# Patient Record
Sex: Female | Born: 1950
Health system: Southern US, Community
[De-identification: ages and names within clinical notes are randomized; demographics above are authoritative.]

## PROBLEM LIST (undated history)

## (undated) DIAGNOSIS — E119 Type 2 diabetes mellitus without complications: Secondary | ICD-10-CM

## (undated) DIAGNOSIS — E669 Obesity, unspecified: Secondary | ICD-10-CM

## (undated) DIAGNOSIS — K2270 Barrett's esophagus without dysplasia: Secondary | ICD-10-CM

## (undated) DIAGNOSIS — G4733 Obstructive sleep apnea (adult) (pediatric): Secondary | ICD-10-CM

## (undated) DIAGNOSIS — K219 Gastro-esophageal reflux disease without esophagitis: Secondary | ICD-10-CM

## (undated) DIAGNOSIS — R569 Unspecified convulsions: Secondary | ICD-10-CM

## (undated) DIAGNOSIS — F419 Anxiety disorder, unspecified: Secondary | ICD-10-CM

## (undated) DIAGNOSIS — I272 Pulmonary hypertension, unspecified: Secondary | ICD-10-CM

## (undated) DIAGNOSIS — R55 Syncope and collapse: Secondary | ICD-10-CM

## (undated) DIAGNOSIS — K449 Diaphragmatic hernia without obstruction or gangrene: Secondary | ICD-10-CM

## (undated) DIAGNOSIS — D649 Anemia, unspecified: Secondary | ICD-10-CM

## (undated) DIAGNOSIS — M545 Low back pain, unspecified: Secondary | ICD-10-CM

## (undated) DIAGNOSIS — R942 Abnormal results of pulmonary function studies: Secondary | ICD-10-CM

## (undated) DIAGNOSIS — M1990 Unspecified osteoarthritis, unspecified site: Secondary | ICD-10-CM

## (undated) DIAGNOSIS — Z7901 Long term (current) use of anticoagulants: Secondary | ICD-10-CM

## (undated) DIAGNOSIS — K3184 Gastroparesis: Secondary | ICD-10-CM

## (undated) DIAGNOSIS — K589 Irritable bowel syndrome without diarrhea: Secondary | ICD-10-CM

## (undated) DIAGNOSIS — I4891 Unspecified atrial fibrillation: Secondary | ICD-10-CM

## (undated) DIAGNOSIS — I480 Paroxysmal atrial fibrillation: Secondary | ICD-10-CM

## (undated) DIAGNOSIS — Z9989 Dependence on other enabling machines and devices: Secondary | ICD-10-CM

## (undated) DIAGNOSIS — G8929 Other chronic pain: Secondary | ICD-10-CM

## (undated) DIAGNOSIS — E785 Hyperlipidemia, unspecified: Secondary | ICD-10-CM

## (undated) DIAGNOSIS — E039 Hypothyroidism, unspecified: Secondary | ICD-10-CM

## (undated) DIAGNOSIS — I499 Cardiac arrhythmia, unspecified: Secondary | ICD-10-CM

## (undated) DIAGNOSIS — I5042 Chronic combined systolic (congestive) and diastolic (congestive) heart failure: Secondary | ICD-10-CM

## (undated) DIAGNOSIS — I10 Essential (primary) hypertension: Secondary | ICD-10-CM

## (undated) HISTORY — DX: Essential (primary) hypertension: I10

## (undated) HISTORY — DX: Abnormal results of pulmonary function studies: R94.2

## (undated) HISTORY — DX: Gastro-esophageal reflux disease without esophagitis: K21.9

## (undated) HISTORY — DX: Anemia, unspecified: D64.9

## (undated) HISTORY — DX: Syncope and collapse: R55

## (undated) HISTORY — DX: Obesity, unspecified: E66.9

## (undated) HISTORY — DX: Hypothyroidism, unspecified: E03.9

## (undated) HISTORY — DX: Chronic combined systolic (congestive) and diastolic (congestive) heart failure: I50.42

## (undated) HISTORY — DX: Hyperlipidemia, unspecified: E78.5

## (undated) HISTORY — DX: Diaphragmatic hernia without obstruction or gangrene: K44.9

## (undated) HISTORY — PX: LAPAROSCOPIC CHOLECYSTECTOMY: SUR755

## (undated) HISTORY — DX: Low back pain, unspecified: M54.50

## (undated) HISTORY — DX: Obstructive sleep apnea (adult) (pediatric): G47.33

## (undated) HISTORY — DX: Unspecified atrial fibrillation: I48.91

## (undated) HISTORY — DX: Low back pain: M54.5

## (undated) HISTORY — DX: Long term (current) use of anticoagulants: Z79.01

## (undated) HISTORY — DX: Other chronic pain: G89.29

## (undated) HISTORY — DX: Dependence on other enabling machines and devices: Z99.89

## (undated) HISTORY — DX: Type 2 diabetes mellitus without complications: E11.9

## (undated) HISTORY — DX: Irritable bowel syndrome, unspecified: K58.9

## (undated) HISTORY — DX: Paroxysmal atrial fibrillation: I48.0

## (undated) HISTORY — DX: Gastroparesis: K31.84

---

## 1992-07-04 HISTORY — PX: CARPAL TUNNEL RELEASE: SHX101

## 1993-07-04 HISTORY — PX: LAMINECTOMY: SHX219

## 1994-07-04 HISTORY — PX: BACK SURGERY: SHX140

## 1997-07-04 HISTORY — PX: TOTAL ABDOMINAL HYSTERECTOMY: SHX209

## 2000-05-12 ENCOUNTER — Encounter: Payer: Self-pay | Admitting: Infectious Diseases

## 2000-05-12 ENCOUNTER — Ambulatory Visit (HOSPITAL_COMMUNITY): Admission: RE | Admit: 2000-05-12 | Discharge: 2000-05-12 | Payer: Self-pay | Admitting: Infectious Diseases

## 2000-07-04 HISTORY — PX: CARDIAC CATHETERIZATION: SHX172

## 2000-11-03 ENCOUNTER — Encounter: Payer: Self-pay | Admitting: *Deleted

## 2000-11-04 ENCOUNTER — Encounter: Payer: Self-pay | Admitting: *Deleted

## 2000-11-04 ENCOUNTER — Inpatient Hospital Stay (HOSPITAL_COMMUNITY): Admission: EM | Admit: 2000-11-04 | Discharge: 2000-11-07 | Payer: Self-pay | Admitting: *Deleted

## 2000-12-18 ENCOUNTER — Ambulatory Visit (HOSPITAL_COMMUNITY): Admission: RE | Admit: 2000-12-18 | Discharge: 2000-12-18 | Payer: Self-pay | Admitting: Internal Medicine

## 2001-06-15 ENCOUNTER — Encounter: Payer: Self-pay | Admitting: Family Medicine

## 2001-06-15 ENCOUNTER — Ambulatory Visit (HOSPITAL_COMMUNITY): Admission: RE | Admit: 2001-06-15 | Discharge: 2001-06-15 | Payer: Self-pay | Admitting: Family Medicine

## 2001-07-20 ENCOUNTER — Emergency Department (HOSPITAL_COMMUNITY): Admission: AC | Admit: 2001-07-20 | Discharge: 2001-07-20 | Payer: Self-pay

## 2001-07-20 ENCOUNTER — Encounter: Payer: Self-pay | Admitting: *Deleted

## 2001-09-18 ENCOUNTER — Ambulatory Visit: Admission: RE | Admit: 2001-09-18 | Discharge: 2001-09-18 | Payer: Self-pay | Admitting: Family Medicine

## 2001-10-27 ENCOUNTER — Inpatient Hospital Stay (HOSPITAL_COMMUNITY): Admission: EM | Admit: 2001-10-27 | Discharge: 2001-10-31 | Payer: Self-pay | Admitting: *Deleted

## 2002-01-13 ENCOUNTER — Emergency Department (HOSPITAL_COMMUNITY): Admission: EM | Admit: 2002-01-13 | Discharge: 2002-01-13 | Payer: Self-pay | Admitting: *Deleted

## 2002-01-13 ENCOUNTER — Encounter: Payer: Self-pay | Admitting: *Deleted

## 2002-03-01 ENCOUNTER — Emergency Department (HOSPITAL_COMMUNITY): Admission: EM | Admit: 2002-03-01 | Discharge: 2002-03-01 | Payer: Self-pay | Admitting: Emergency Medicine

## 2002-03-01 ENCOUNTER — Encounter: Payer: Self-pay | Admitting: Emergency Medicine

## 2002-05-03 ENCOUNTER — Emergency Department (HOSPITAL_COMMUNITY): Admission: EM | Admit: 2002-05-03 | Discharge: 2002-05-03 | Payer: Self-pay | Admitting: *Deleted

## 2002-05-03 ENCOUNTER — Encounter: Payer: Self-pay | Admitting: Emergency Medicine

## 2002-05-09 ENCOUNTER — Encounter: Admission: RE | Admit: 2002-05-09 | Discharge: 2002-08-07 | Payer: Self-pay | Admitting: Family Medicine

## 2002-05-25 ENCOUNTER — Emergency Department (HOSPITAL_COMMUNITY): Admission: EM | Admit: 2002-05-25 | Discharge: 2002-05-25 | Payer: Self-pay

## 2002-11-13 ENCOUNTER — Encounter: Payer: Self-pay | Admitting: Family Medicine

## 2002-11-13 ENCOUNTER — Ambulatory Visit (HOSPITAL_COMMUNITY): Admission: RE | Admit: 2002-11-13 | Discharge: 2002-11-13 | Payer: Self-pay | Admitting: Family Medicine

## 2002-11-15 ENCOUNTER — Ambulatory Visit (HOSPITAL_COMMUNITY): Admission: RE | Admit: 2002-11-15 | Discharge: 2002-11-15 | Payer: Self-pay | Admitting: Family Medicine

## 2003-01-09 ENCOUNTER — Emergency Department (HOSPITAL_COMMUNITY): Admission: EM | Admit: 2003-01-09 | Discharge: 2003-01-09 | Payer: Self-pay | Admitting: Emergency Medicine

## 2003-01-09 ENCOUNTER — Encounter: Payer: Self-pay | Admitting: Emergency Medicine

## 2003-04-04 ENCOUNTER — Ambulatory Visit (HOSPITAL_COMMUNITY): Admission: RE | Admit: 2003-04-04 | Discharge: 2003-04-04 | Payer: Self-pay | Admitting: Family Medicine

## 2003-04-04 ENCOUNTER — Encounter: Payer: Self-pay | Admitting: Family Medicine

## 2003-10-11 ENCOUNTER — Emergency Department (HOSPITAL_COMMUNITY): Admission: AD | Admit: 2003-10-11 | Discharge: 2003-10-11 | Payer: Self-pay | Admitting: Family Medicine

## 2003-11-20 ENCOUNTER — Emergency Department (HOSPITAL_COMMUNITY): Admission: EM | Admit: 2003-11-20 | Discharge: 2003-11-20 | Payer: Self-pay | Admitting: Family Medicine

## 2004-05-21 ENCOUNTER — Emergency Department (HOSPITAL_COMMUNITY): Admission: EM | Admit: 2004-05-21 | Discharge: 2004-05-22 | Payer: Self-pay

## 2004-09-20 ENCOUNTER — Emergency Department (HOSPITAL_COMMUNITY): Admission: EM | Admit: 2004-09-20 | Discharge: 2004-09-20 | Payer: Self-pay | Admitting: Emergency Medicine

## 2005-01-20 ENCOUNTER — Ambulatory Visit (HOSPITAL_COMMUNITY): Admission: RE | Admit: 2005-01-20 | Discharge: 2005-01-20 | Payer: Self-pay | Admitting: Family Medicine

## 2006-01-12 ENCOUNTER — Emergency Department (HOSPITAL_COMMUNITY): Admission: EM | Admit: 2006-01-12 | Discharge: 2006-01-12 | Payer: Self-pay | Admitting: Family Medicine

## 2006-02-08 ENCOUNTER — Emergency Department (HOSPITAL_COMMUNITY): Admission: EM | Admit: 2006-02-08 | Discharge: 2006-02-08 | Payer: Self-pay | Admitting: Family Medicine

## 2006-02-25 IMAGING — CR DG CHEST 2V
2 series · 2 of 2 positions shown · non-contrast
Comparison: none

CLINICAL DATA: Cough.  Sore throat.  
 TWO VIEW CHEST ? 10/11/03 
 No prior studies.

[view not recorded (1 of 2)]
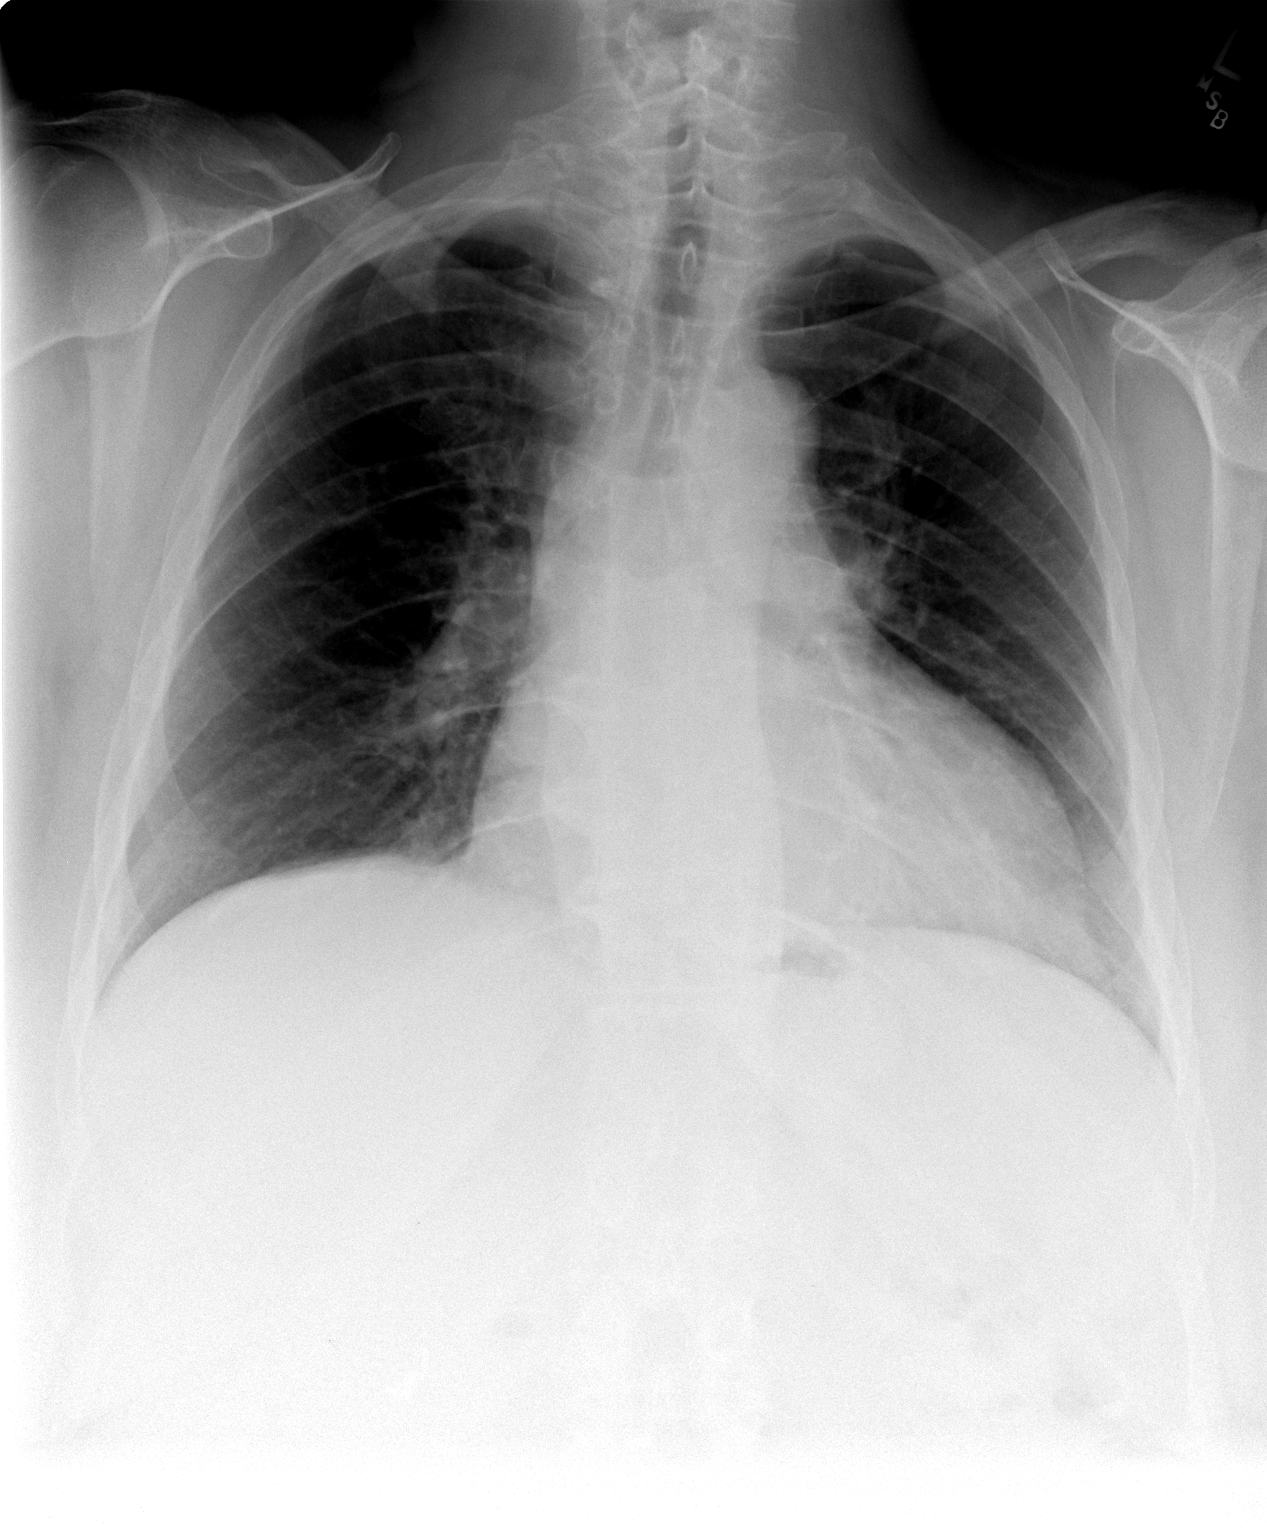

[view not recorded (2 of 2)]
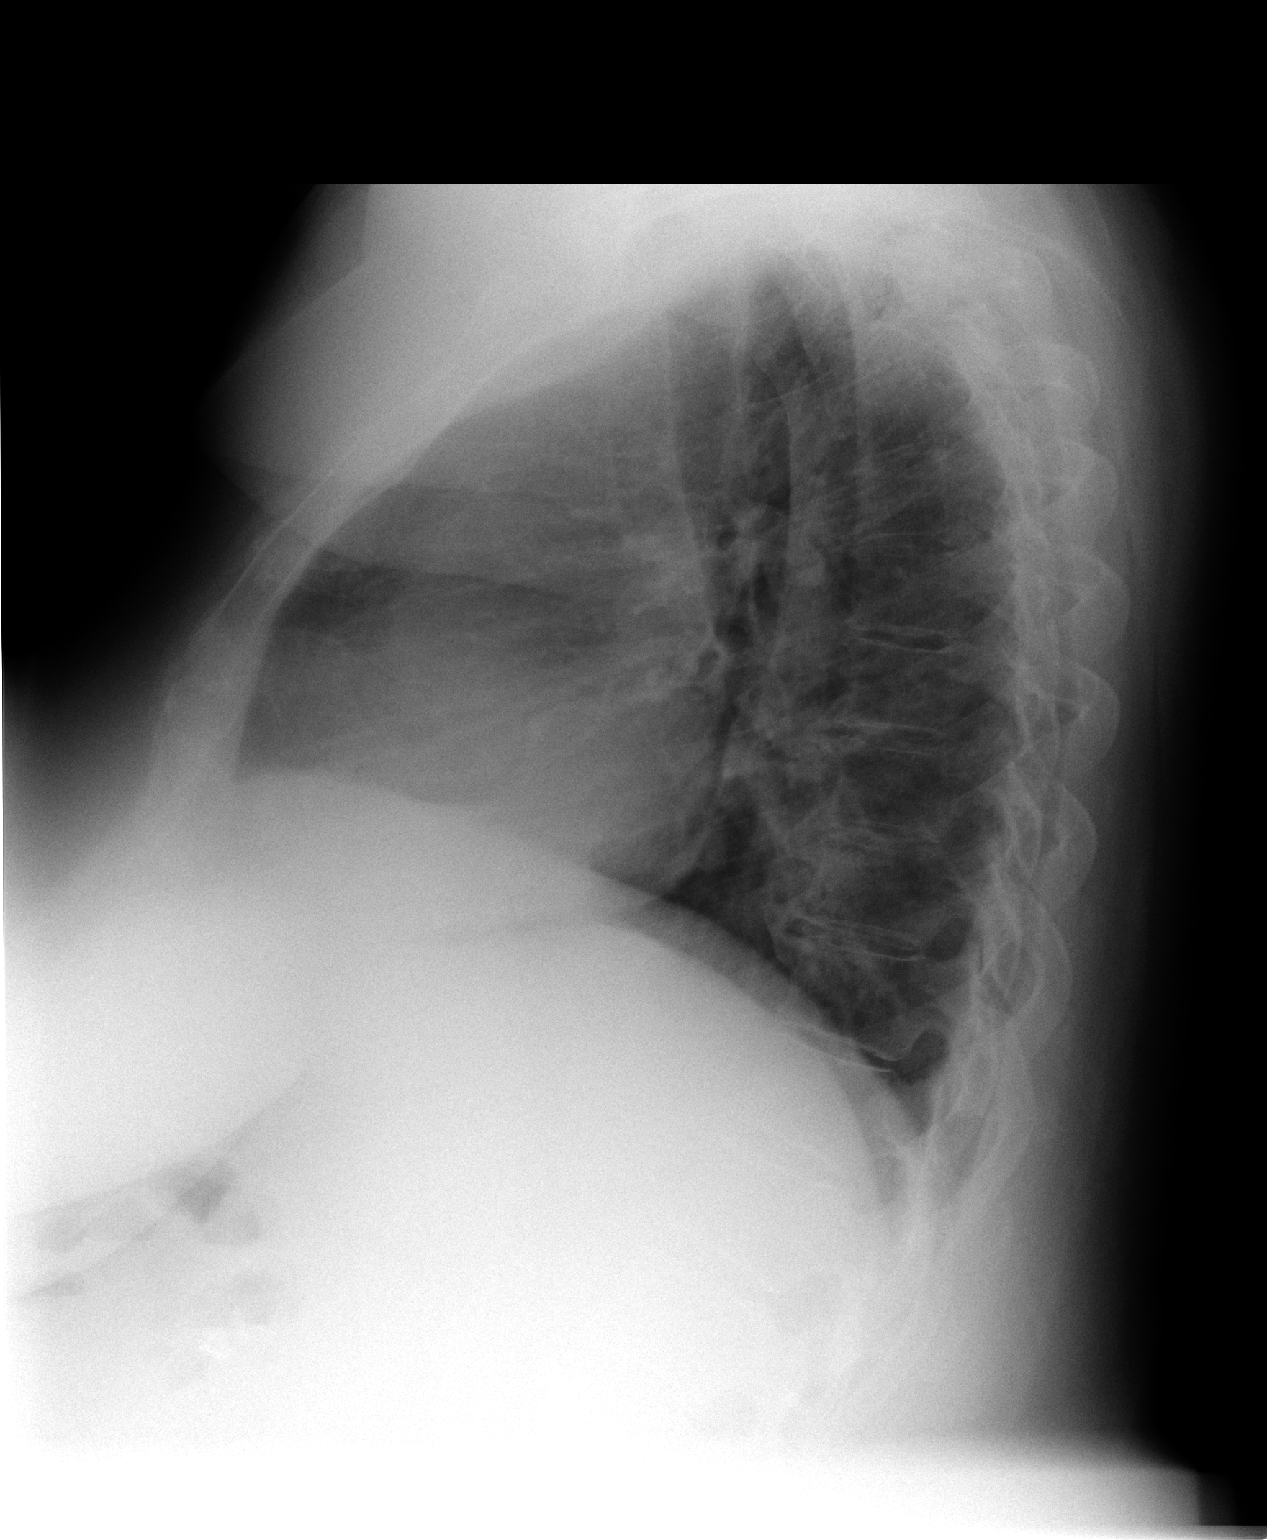

[2 of 2 positions shown; findings below may reference images not displayed]

FINDINGS: There is mild cardiomegaly.  Low-lung volumes are present.  The lungs appear clear.
IMPRESSION: Mild cardiomegaly.

## 2006-04-04 ENCOUNTER — Emergency Department (HOSPITAL_COMMUNITY): Admission: EM | Admit: 2006-04-04 | Discharge: 2006-04-04 | Payer: Self-pay | Admitting: Emergency Medicine

## 2006-07-04 HISTORY — PX: ESOPHAGOGASTRODUODENOSCOPY: SHX1529

## 2006-07-04 HISTORY — PX: CARDIOVASCULAR STRESS TEST: SHX262

## 2006-10-16 ENCOUNTER — Ambulatory Visit: Payer: Self-pay | Admitting: Internal Medicine

## 2006-10-16 ENCOUNTER — Observation Stay (HOSPITAL_COMMUNITY): Admission: EM | Admit: 2006-10-16 | Discharge: 2006-10-17 | Payer: Self-pay | Admitting: Emergency Medicine

## 2006-10-16 ENCOUNTER — Ambulatory Visit: Payer: Self-pay | Admitting: Cardiology

## 2006-10-24 ENCOUNTER — Ambulatory Visit: Payer: Self-pay

## 2006-10-25 ENCOUNTER — Ambulatory Visit: Payer: Self-pay

## 2006-11-03 ENCOUNTER — Ambulatory Visit: Payer: Self-pay | Admitting: Internal Medicine

## 2006-11-06 ENCOUNTER — Ambulatory Visit: Payer: Self-pay | Admitting: Cardiology

## 2006-11-24 ENCOUNTER — Encounter (INDEPENDENT_AMBULATORY_CARE_PROVIDER_SITE_OTHER): Payer: Self-pay | Admitting: Internal Medicine

## 2006-11-24 ENCOUNTER — Ambulatory Visit: Payer: Self-pay | Admitting: Internal Medicine

## 2006-11-24 ENCOUNTER — Ambulatory Visit (HOSPITAL_COMMUNITY): Admission: RE | Admit: 2006-11-24 | Discharge: 2006-11-24 | Payer: Self-pay | Admitting: Internal Medicine

## 2007-01-12 ENCOUNTER — Ambulatory Visit (HOSPITAL_COMMUNITY): Admission: RE | Admit: 2007-01-12 | Discharge: 2007-01-12 | Payer: Self-pay | Admitting: Internal Medicine

## 2007-01-24 ENCOUNTER — Ambulatory Visit (HOSPITAL_COMMUNITY): Admission: RE | Admit: 2007-01-24 | Discharge: 2007-01-24 | Payer: Self-pay | Admitting: Internal Medicine

## 2007-02-05 IMAGING — CT CT PELVIS W/ CM
1 of 3 series · 13 of 32 positions shown, 18 images · IV contrast (CONTRAST)
Comparison: Previous study made 07/20/01.

CLINICAL DATA: Left lower abdominal pain with nausea, vomiting, and diarrhea which began today.  The patient has a history of complications from a hysterectomy in 1444.

[Series 3591: — · axial · 0.88mm/px · z∈[+1350,+1804]mm · 13 of 105 slices shown, 18 images]
[im 7/105  soft-tissue]
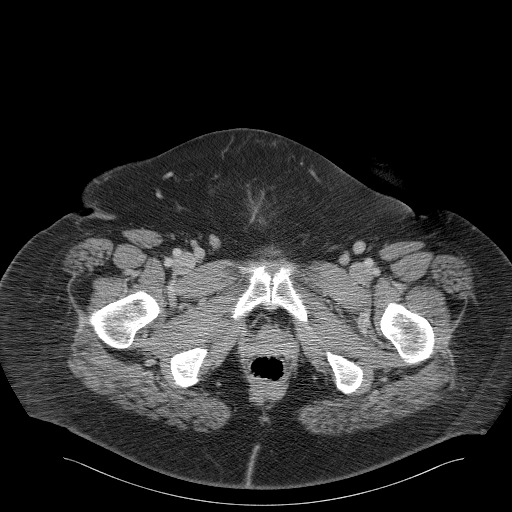
[im 7/105  bone]
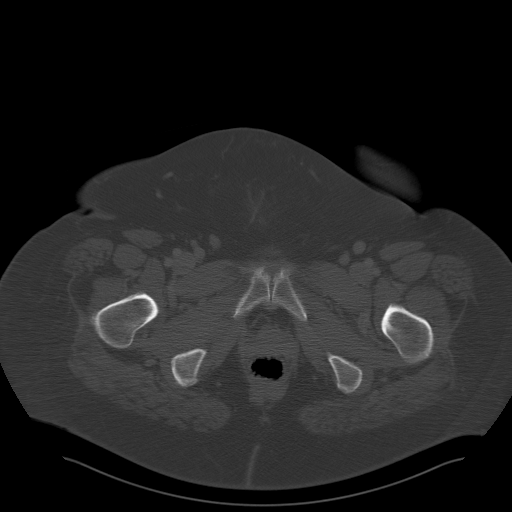
[im 19/105  soft-tissue]
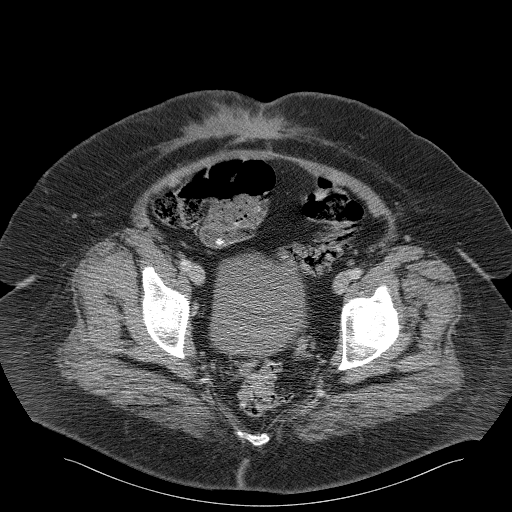
[im 25/105  soft-tissue]
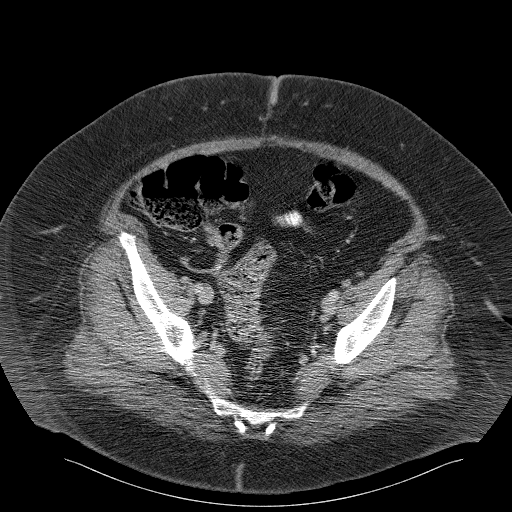
[im 31/105  soft-tissue]
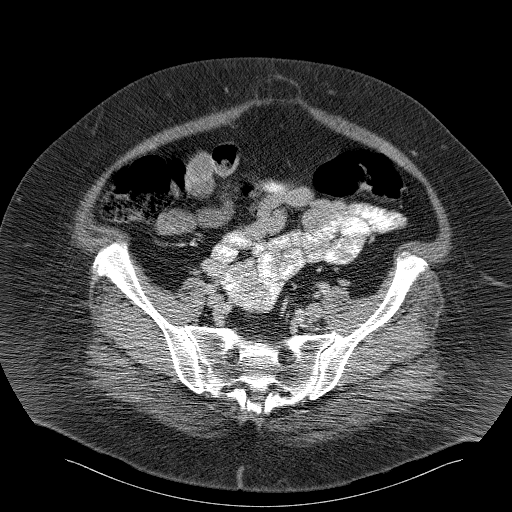
[im 43/105  soft-tissue]
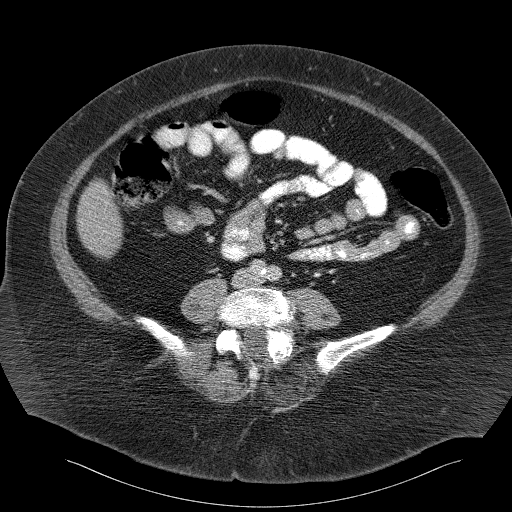
[im 49/105  soft-tissue]
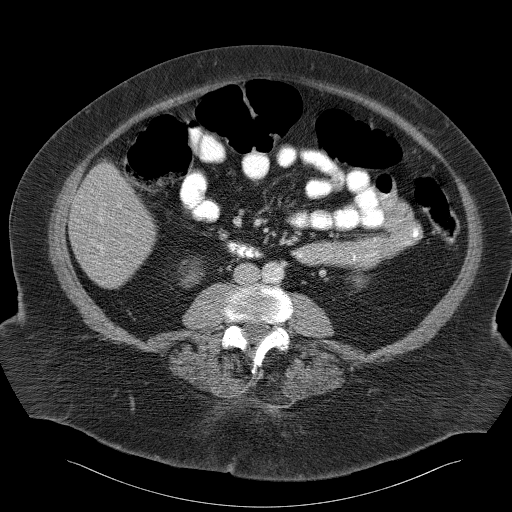
[im 56/105  soft-tissue]
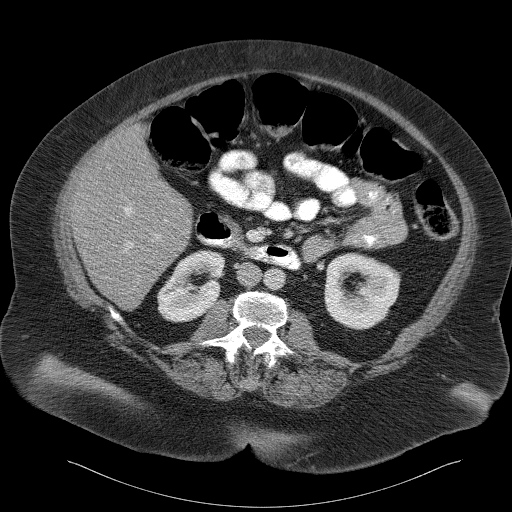
[im 68/105  soft-tissue]
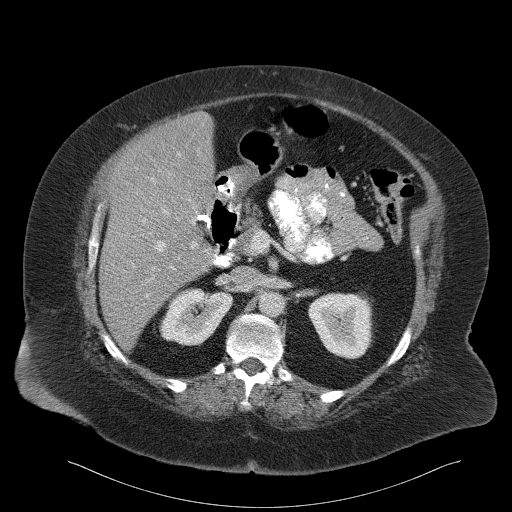
[im 74/105  soft-tissue]
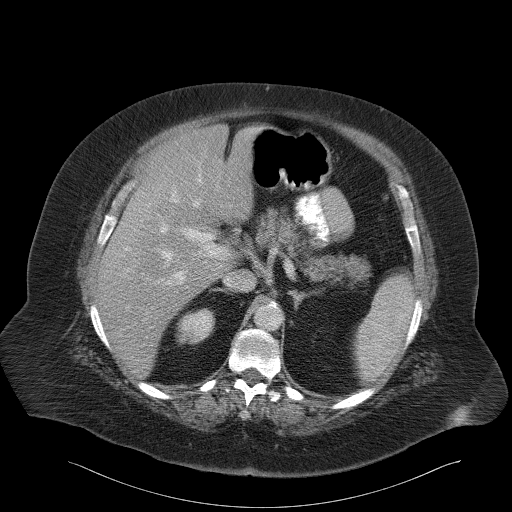
[im 74/105  bone]
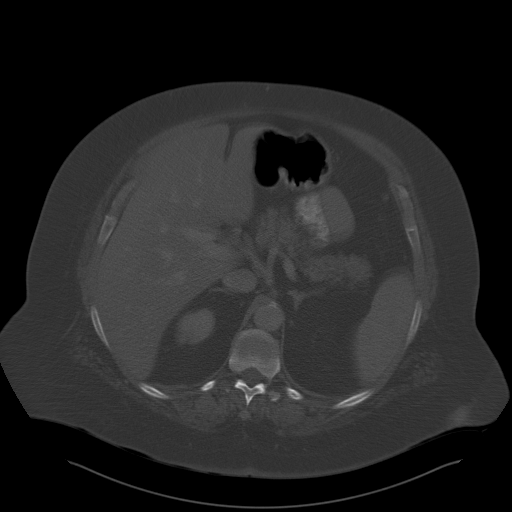
[im 80/105  soft-tissue]
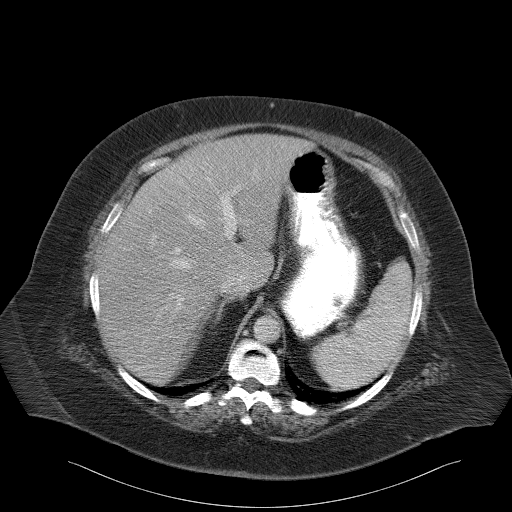
[im 80/105  lung]
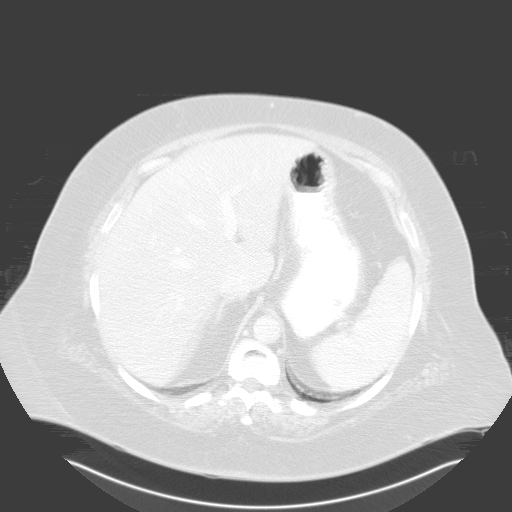
[im 86/105  lung]
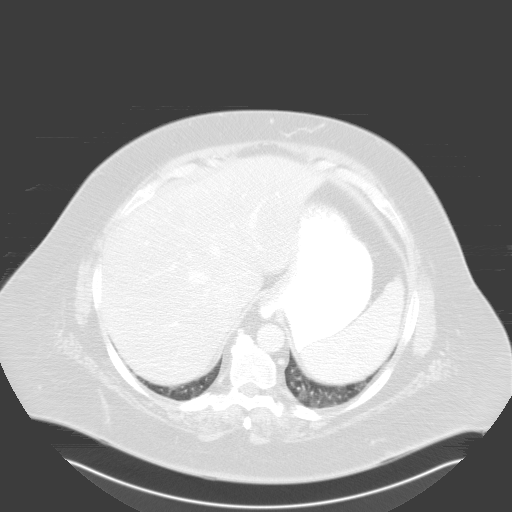
[im 92/105  soft-tissue]
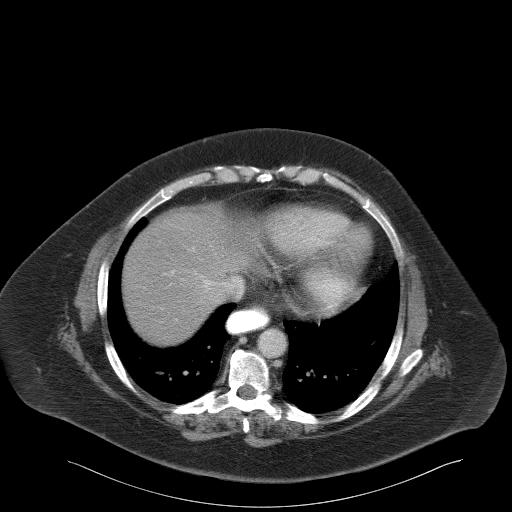
[im 92/105  lung]
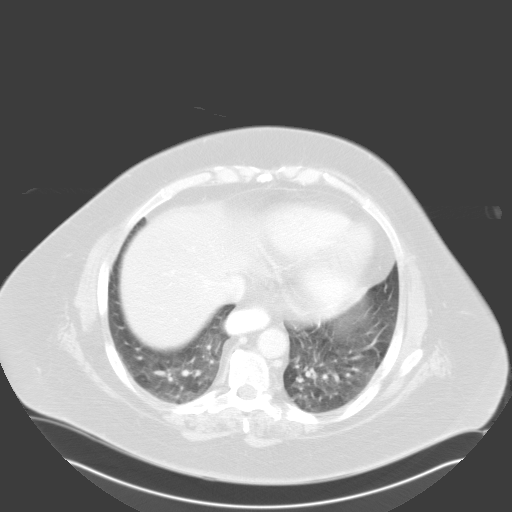
[im 98/105  soft-tissue]
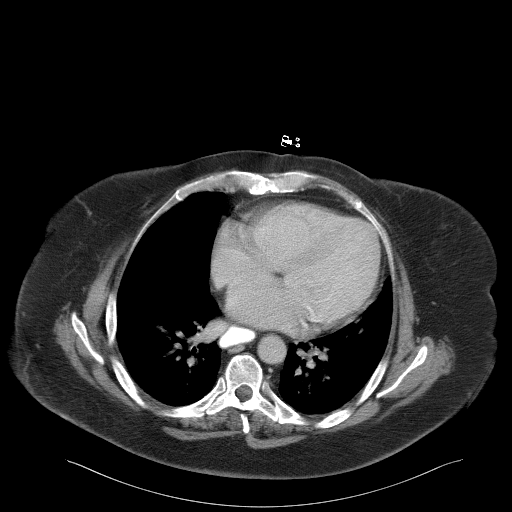
[im 98/105  lung]
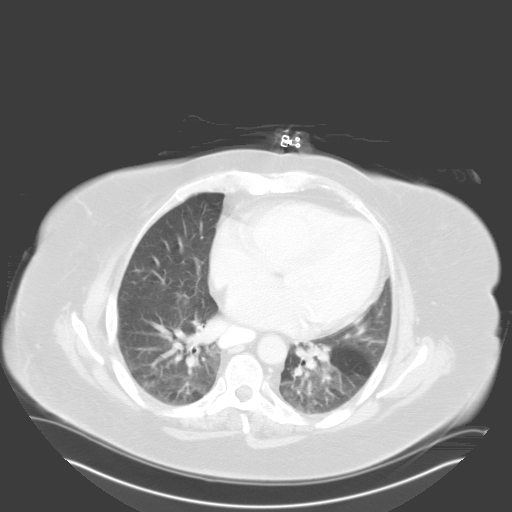

[13 of 32 positions shown; findings below may reference images not displayed]

CT SCAN OF THE ABDOMEN WITH CONTRAST:
 After the intravenous injection of 150 mL of Omnipaque 300, a series of scans through the abdomen were made and are compared to the previous study of 07/20/01 which was made without contrast and shows a hiatal hernia.  The lower lung fields appear normal.  The gallbladder is seen to have been removed.  The common bile duct, pancreas, and spleen appear normal.  The adrenal glands and the kidneys are normal.  There is no free fluid or air within the abdomen.  The small bowel appears normal.  There has been near complete transit of the small bowel by the contrast.  The colon shows a moderate amount of generalized gas, but no definite obstruction or mass is seen.  
 There is a small umbilical hernia that contains only fat.  The bones of the lower thoracic and the upper lumbar spine show some hypertrophic spurring, but are otherwise normal.  No abnormal lymph nodes are seen.
IMPRESSION: 1.  Hiatal hernia with reflux of the gastric contrast into the distal esophagus.  
 2.  Post cholecystectomy.  
 3.  Small umbilical hernia contains only fat.
 CT SCAN OF THE PELVIS WITH CONTRAST:
 CT scan of the pelvis, utilizing the same contrast as given for the abdomen, shows the uterus to have been removed.  The ovaries are not definitely identified.  There is a large anterior abdominal wall scar in the lower mid pelvis.  There is a moderate amount of gas and fecal material throughout the colon, but no definite obstruction, mass, or diverticulitis is identified.  No free fluid or air is seen within the pelvis.  No abnormal lymph nodes are seen.  The bones of the pelvis show some degenerative hypertrophic spurring as does the lower lumbar spine, where there are also noted facet arthritis changes.
IMPRESSION: 1.  Status post hysterectomy with large lower abdominal wall midline scar.  
 2.  No evidence of obstruction or perforation of bowel.  
 3.  The bladder is partially filled and appears normal, as are the distal ureters.  
 4.  Some degenerative hypertrophic facet arthritis in the lower lumbar spine.

## 2007-04-04 ENCOUNTER — Ambulatory Visit (HOSPITAL_COMMUNITY): Admission: RE | Admit: 2007-04-04 | Discharge: 2007-04-04 | Payer: Self-pay | Admitting: Family Medicine

## 2007-07-23 ENCOUNTER — Emergency Department (HOSPITAL_COMMUNITY): Admission: EM | Admit: 2007-07-23 | Discharge: 2007-07-24 | Payer: Self-pay | Admitting: Emergency Medicine

## 2007-12-05 ENCOUNTER — Encounter: Admission: RE | Admit: 2007-12-05 | Discharge: 2007-12-05 | Payer: Self-pay | Admitting: Surgery

## 2007-12-05 ENCOUNTER — Ambulatory Visit (HOSPITAL_COMMUNITY): Admission: RE | Admit: 2007-12-05 | Discharge: 2007-12-05 | Payer: Self-pay | Admitting: Surgery

## 2007-12-07 ENCOUNTER — Ambulatory Visit: Admission: RE | Admit: 2007-12-07 | Discharge: 2007-12-07 | Payer: Self-pay | Admitting: Surgery

## 2007-12-12 ENCOUNTER — Ambulatory Visit (HOSPITAL_COMMUNITY): Admission: RE | Admit: 2007-12-12 | Discharge: 2007-12-12 | Payer: Self-pay | Admitting: Surgery

## 2008-02-04 ENCOUNTER — Emergency Department (HOSPITAL_COMMUNITY): Admission: EM | Admit: 2008-02-04 | Discharge: 2008-02-04 | Payer: Self-pay | Admitting: Emergency Medicine

## 2008-02-09 ENCOUNTER — Emergency Department (HOSPITAL_COMMUNITY): Admission: EM | Admit: 2008-02-09 | Discharge: 2008-02-09 | Payer: Self-pay | Admitting: Emergency Medicine

## 2008-03-23 ENCOUNTER — Inpatient Hospital Stay (HOSPITAL_COMMUNITY): Admission: EM | Admit: 2008-03-23 | Discharge: 2008-03-30 | Payer: Self-pay | Admitting: Emergency Medicine

## 2008-03-23 ENCOUNTER — Ambulatory Visit: Payer: Self-pay | Admitting: Cardiology

## 2008-03-24 ENCOUNTER — Encounter: Payer: Self-pay | Admitting: Cardiology

## 2008-03-31 ENCOUNTER — Emergency Department (HOSPITAL_COMMUNITY): Admission: EM | Admit: 2008-03-31 | Discharge: 2008-03-31 | Payer: Self-pay | Admitting: Emergency Medicine

## 2008-04-02 ENCOUNTER — Ambulatory Visit: Payer: Self-pay | Admitting: Cardiology

## 2008-04-07 ENCOUNTER — Ambulatory Visit: Payer: Self-pay | Admitting: Cardiology

## 2008-04-10 DIAGNOSIS — I4891 Unspecified atrial fibrillation: Secondary | ICD-10-CM

## 2008-04-10 DIAGNOSIS — E669 Obesity, unspecified: Secondary | ICD-10-CM | POA: Diagnosis present

## 2008-04-10 DIAGNOSIS — M545 Low back pain, unspecified: Secondary | ICD-10-CM | POA: Insufficient documentation

## 2008-04-10 DIAGNOSIS — Z6841 Body Mass Index (BMI) 40.0 and over, adult: Secondary | ICD-10-CM | POA: Insufficient documentation

## 2008-04-10 DIAGNOSIS — R072 Precordial pain: Secondary | ICD-10-CM | POA: Insufficient documentation

## 2008-04-10 DIAGNOSIS — I509 Heart failure, unspecified: Secondary | ICD-10-CM | POA: Insufficient documentation

## 2008-04-10 DIAGNOSIS — K219 Gastro-esophageal reflux disease without esophagitis: Secondary | ICD-10-CM | POA: Insufficient documentation

## 2008-04-10 DIAGNOSIS — E039 Hypothyroidism, unspecified: Secondary | ICD-10-CM | POA: Insufficient documentation

## 2008-04-14 ENCOUNTER — Ambulatory Visit: Payer: Self-pay | Admitting: Cardiology

## 2008-04-21 ENCOUNTER — Ambulatory Visit: Payer: Self-pay | Admitting: Cardiology

## 2008-04-24 ENCOUNTER — Ambulatory Visit: Payer: Self-pay | Admitting: Cardiology

## 2008-04-29 ENCOUNTER — Emergency Department (HOSPITAL_COMMUNITY): Admission: EM | Admit: 2008-04-29 | Discharge: 2008-04-30 | Payer: Self-pay | Admitting: Emergency Medicine

## 2008-04-29 ENCOUNTER — Ambulatory Visit: Payer: Self-pay | Admitting: Cardiology

## 2008-05-01 ENCOUNTER — Ambulatory Visit: Payer: Self-pay | Admitting: Cardiology

## 2008-05-08 ENCOUNTER — Ambulatory Visit: Payer: Self-pay | Admitting: Gastroenterology

## 2008-05-09 ENCOUNTER — Ambulatory Visit: Payer: Self-pay | Admitting: Cardiology

## 2008-05-12 ENCOUNTER — Encounter (HOSPITAL_COMMUNITY): Admission: RE | Admit: 2008-05-12 | Discharge: 2008-06-11 | Payer: Self-pay | Admitting: Gastroenterology

## 2008-05-23 ENCOUNTER — Ambulatory Visit: Payer: Self-pay | Admitting: Cardiology

## 2008-05-29 IMAGING — CR DG ANKLE COMPLETE 3+V*L*
3 series · 3 of 3 positions shown · non-contrast
Comparison: none

CLINICAL DATA: Fall.  
 LEFT ANKLE ? 3 VIEW:

[view not recorded (1 of 3)]
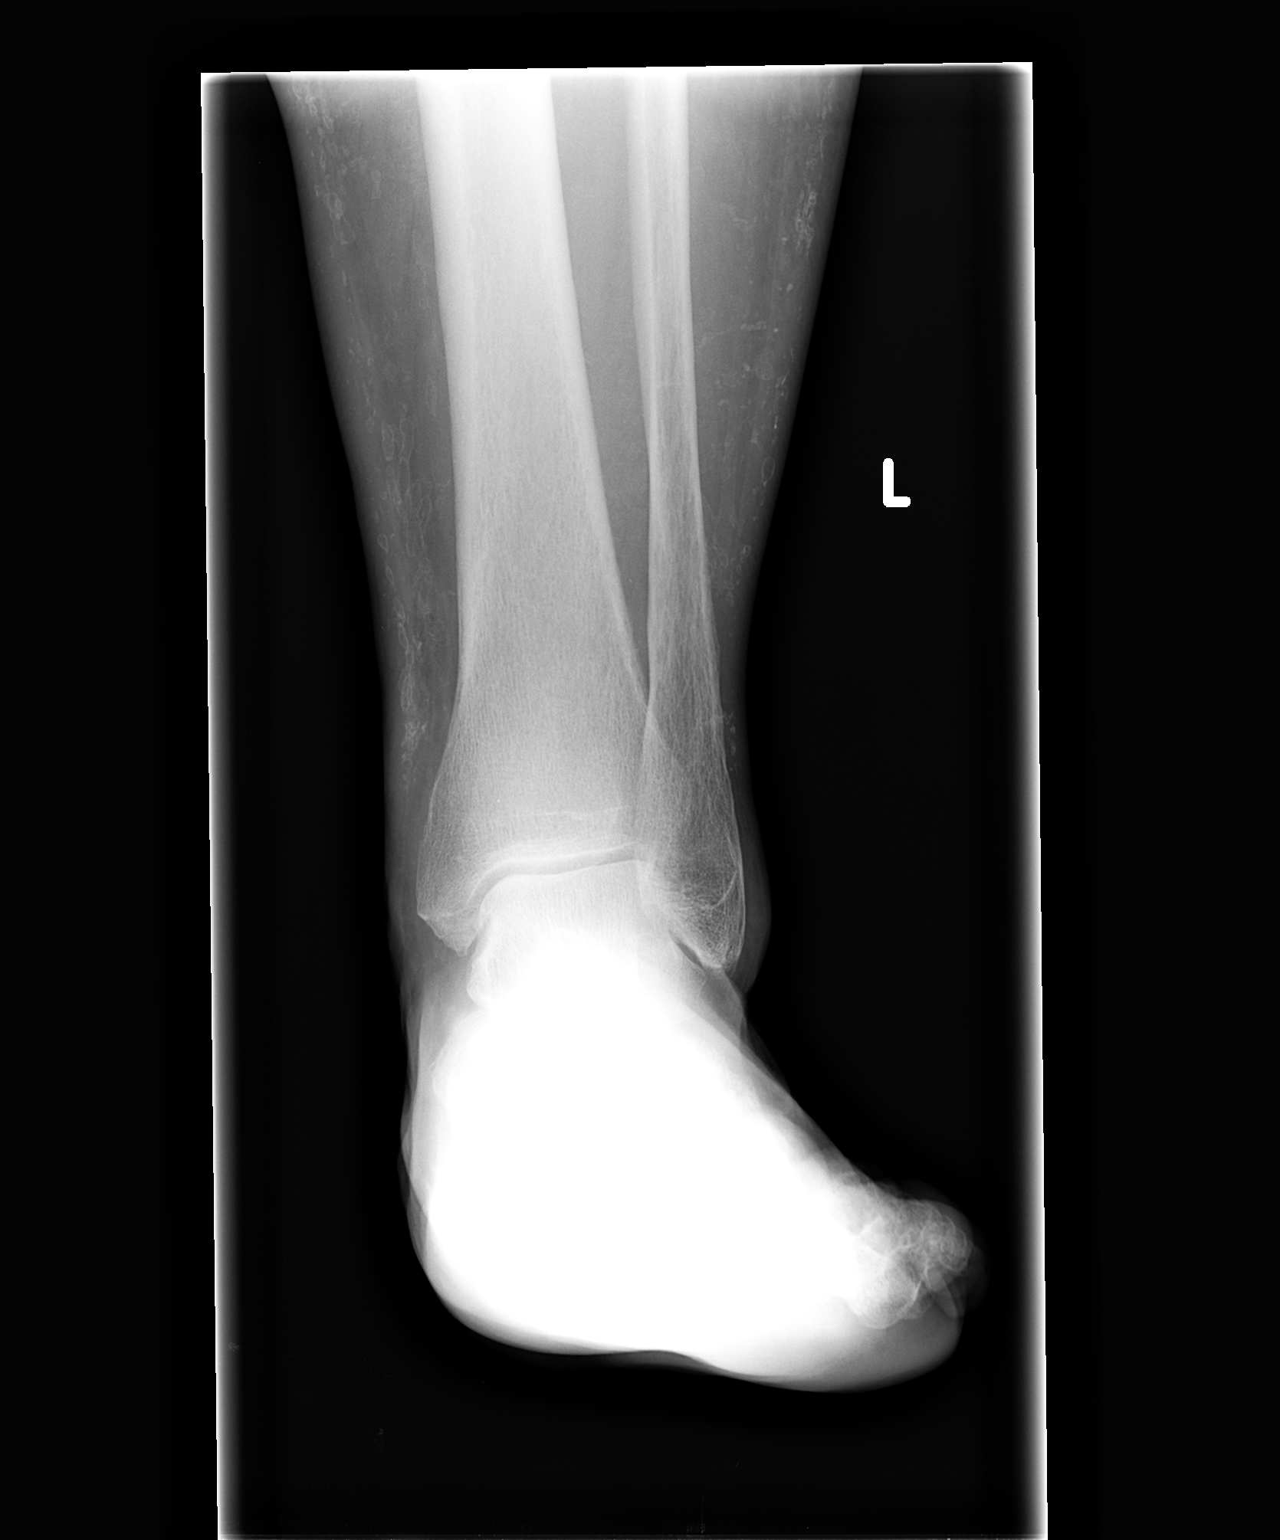

[view not recorded (2 of 3)]
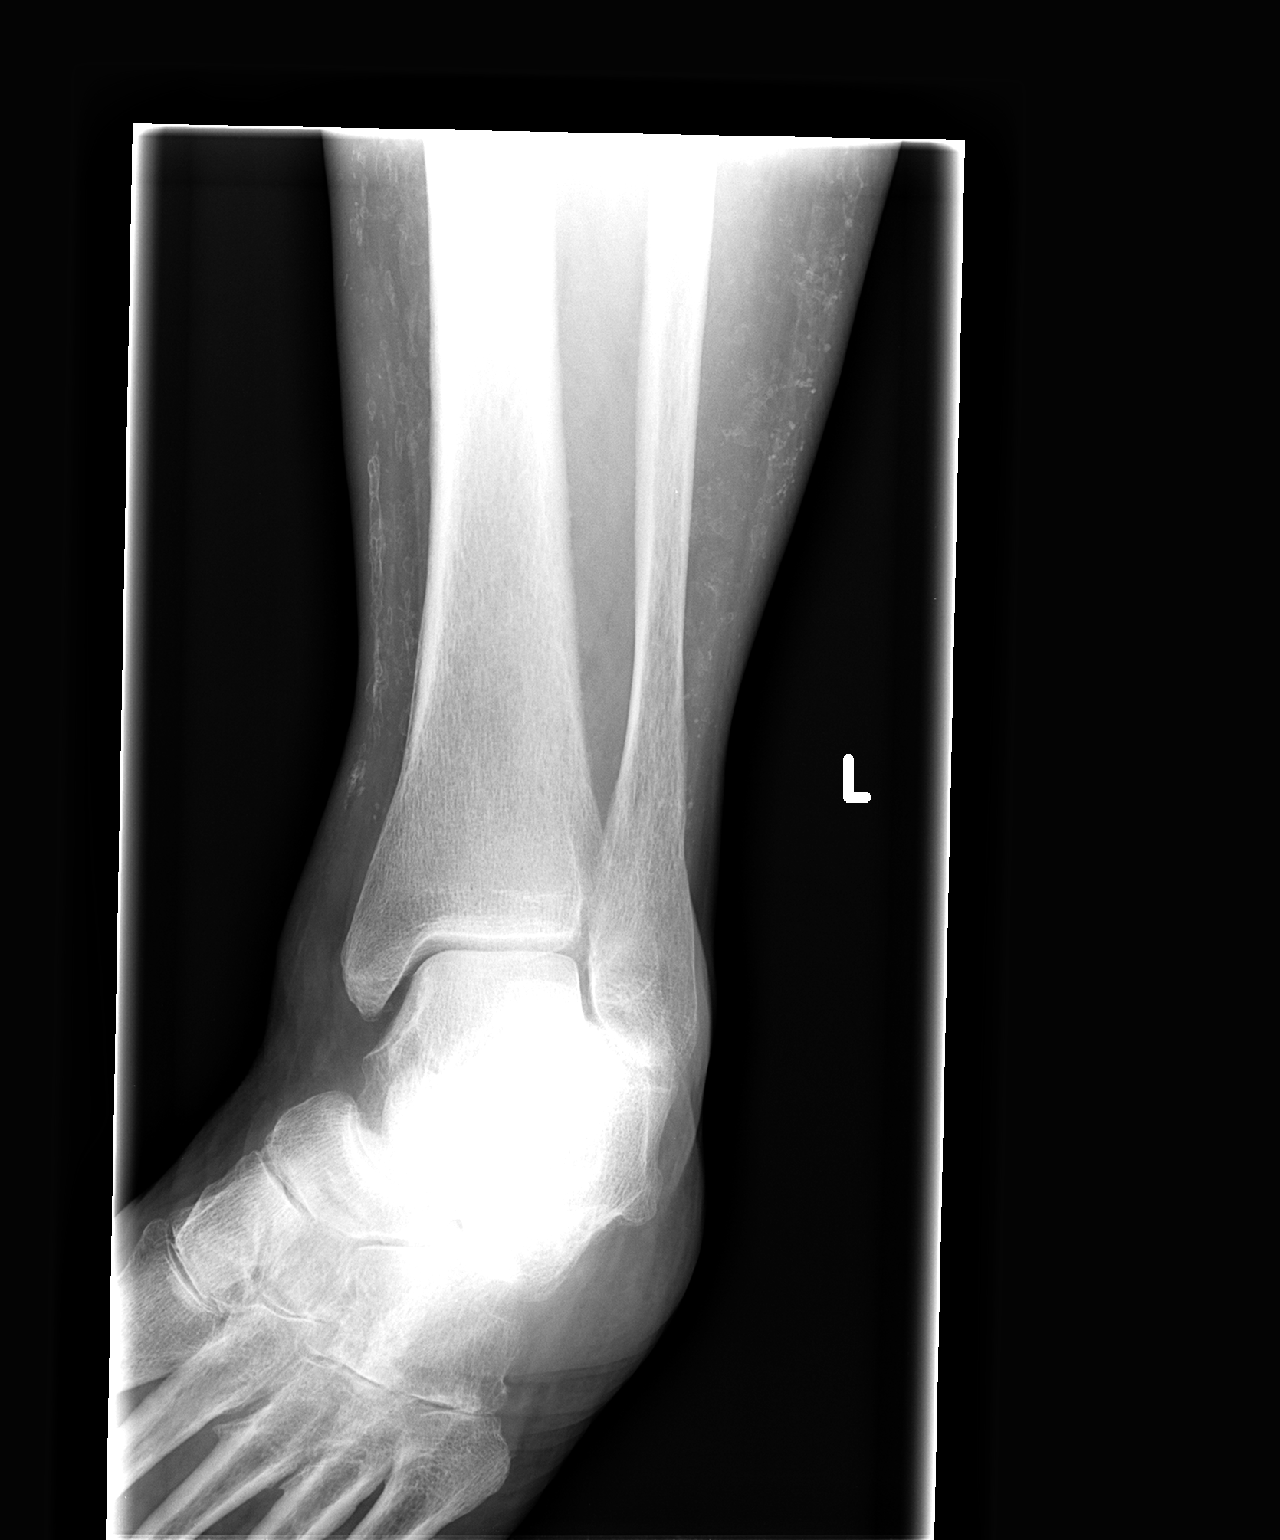

[view not recorded (3 of 3)]
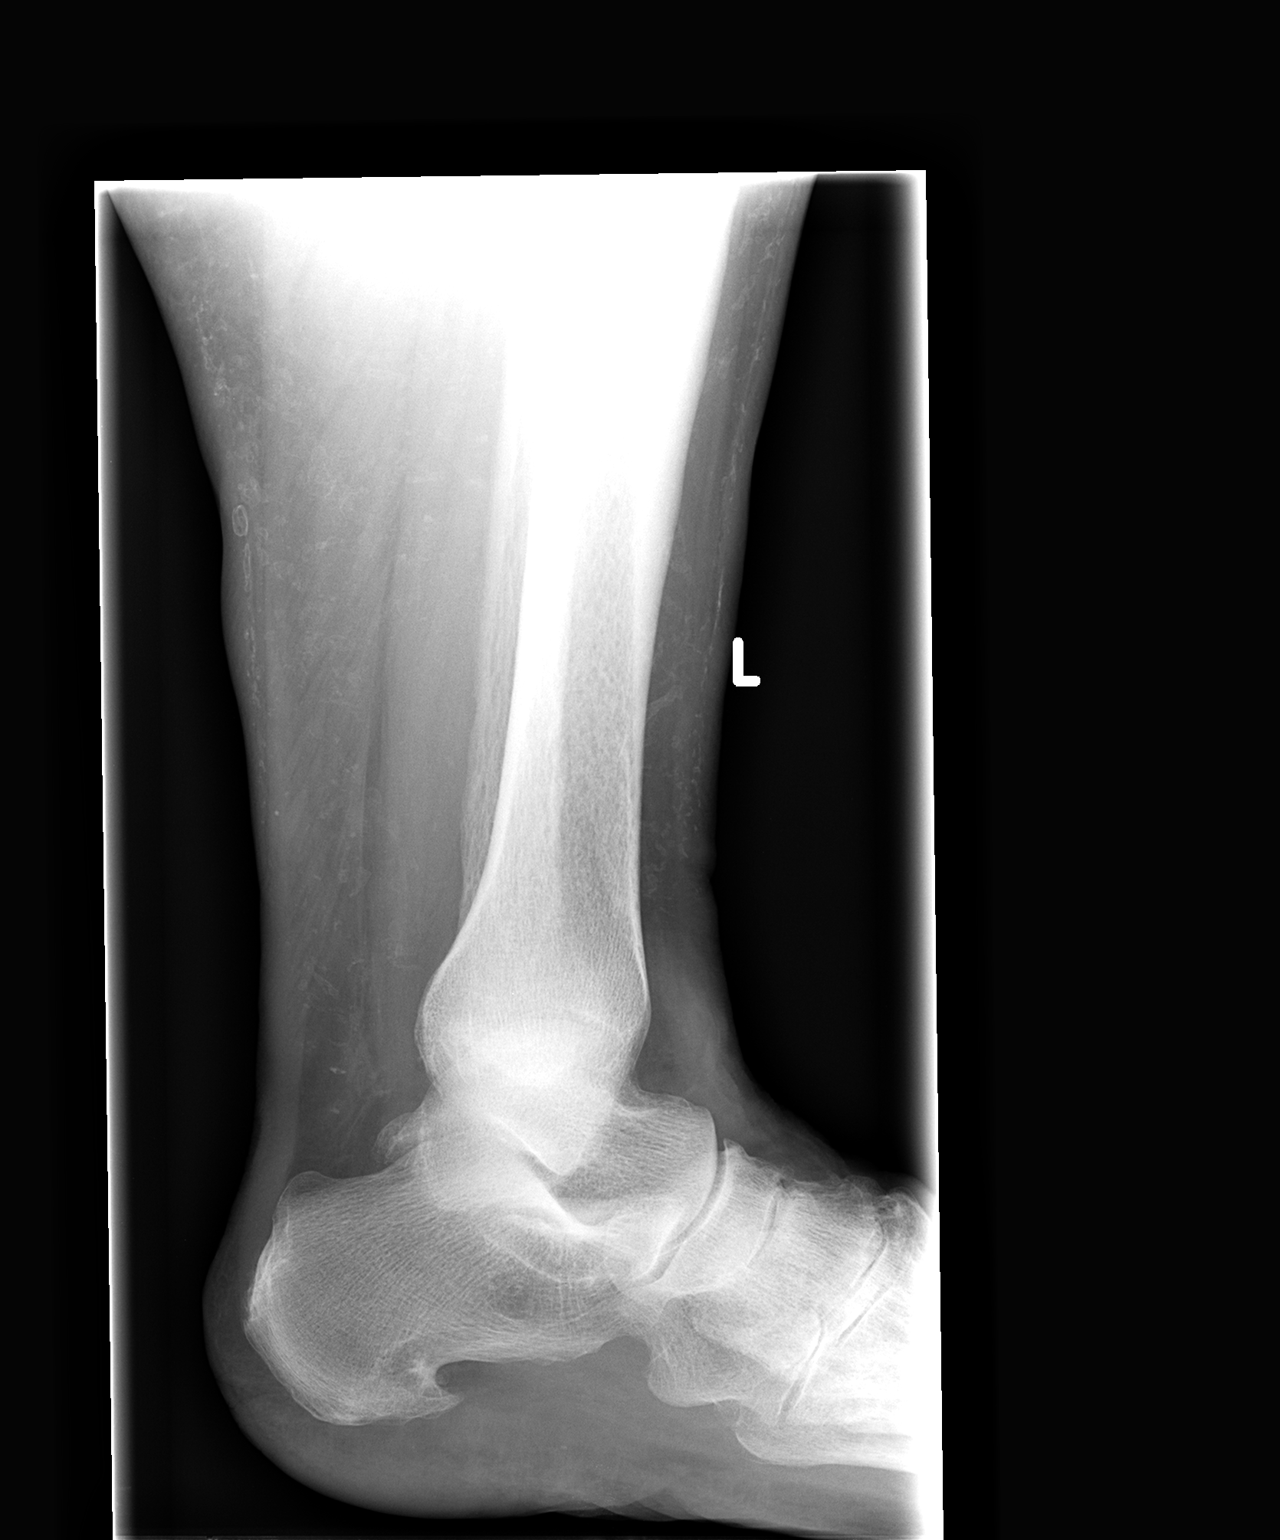

[3 of 3 positions shown; findings below may reference images not displayed]

FINDINGS: Normal alignment and no fracture.  Extensive soft tissue calcification is noted probable due to venous insufficiency.  There is spurring in the mid foot and in the calcaneus.
IMPRESSION: Negative for fracture.

## 2008-05-29 IMAGING — CR DG WRIST COMPLETE 3+V*L*
2 series · 2 of 2 positions shown · non-contrast
Comparison: none

CLINICAL DATA: 54-year-old female status post fall.  Pain left wrist.  
 LEFT WRIST ? 3 VIEW:

[view not recorded (1 of 2)]
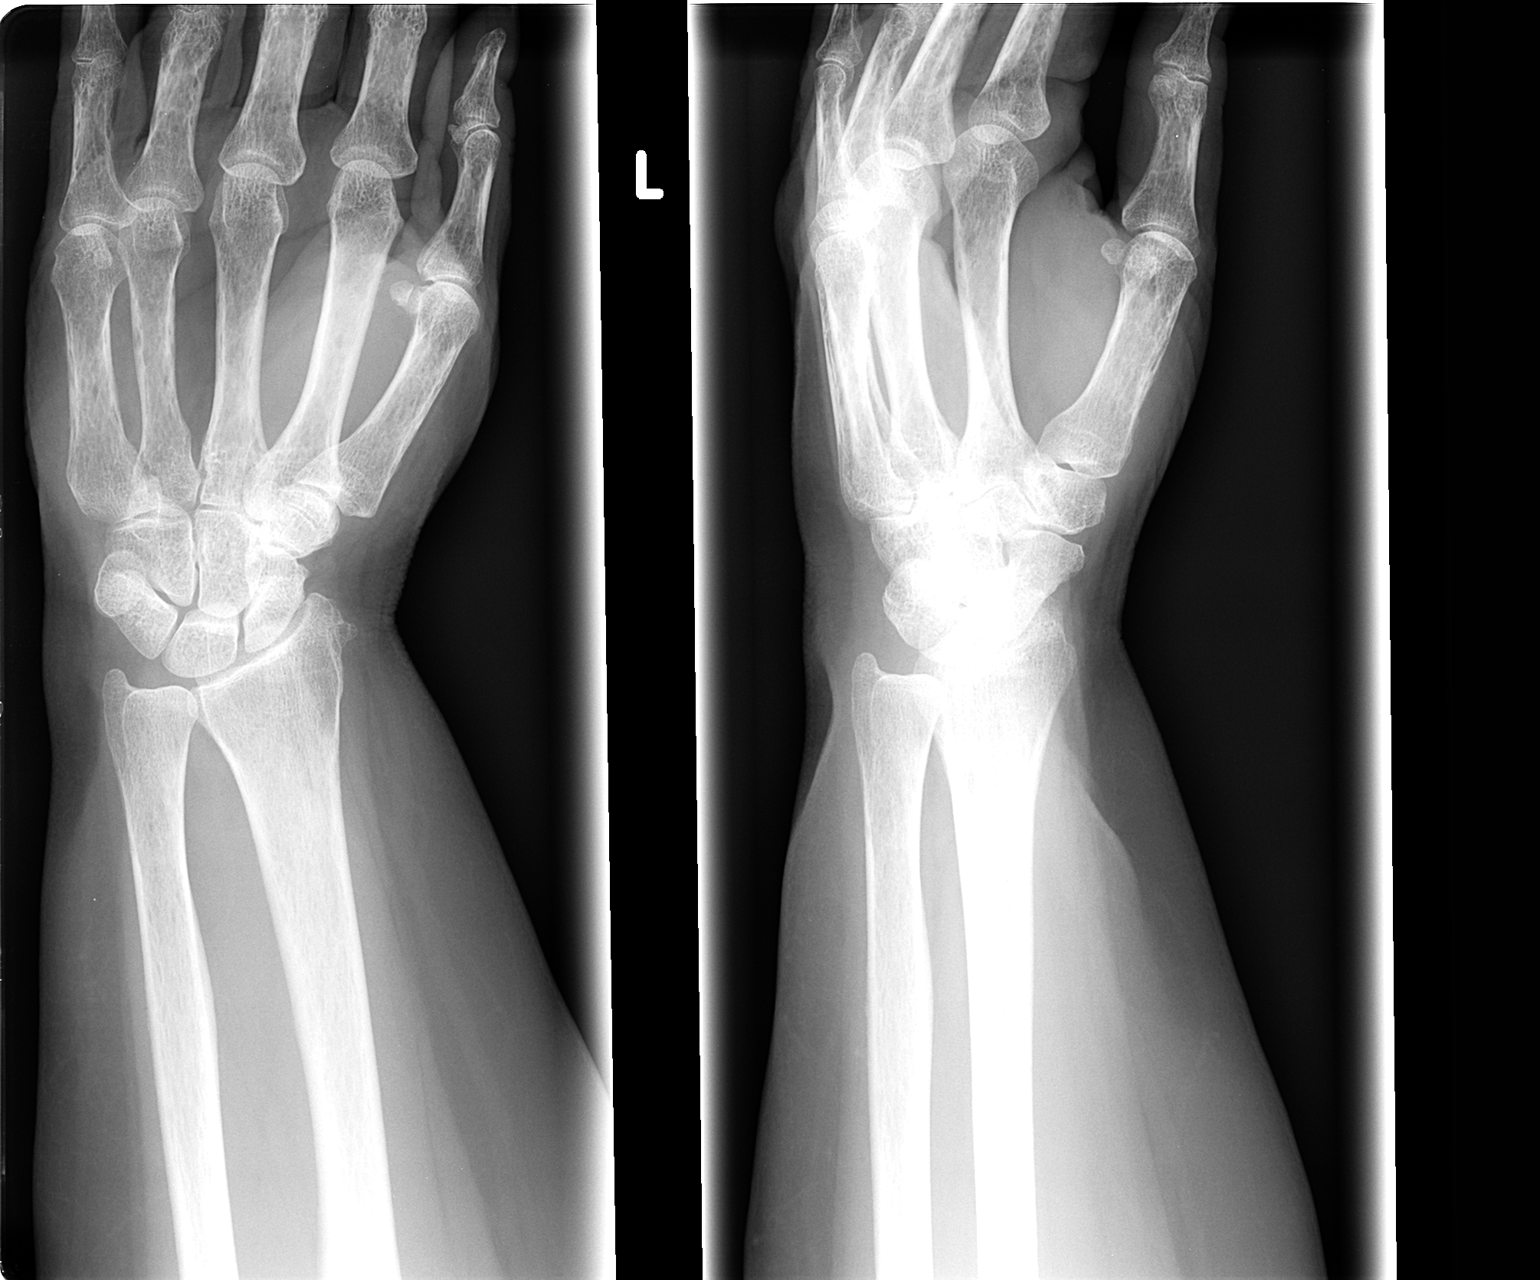

[view not recorded (2 of 2)]
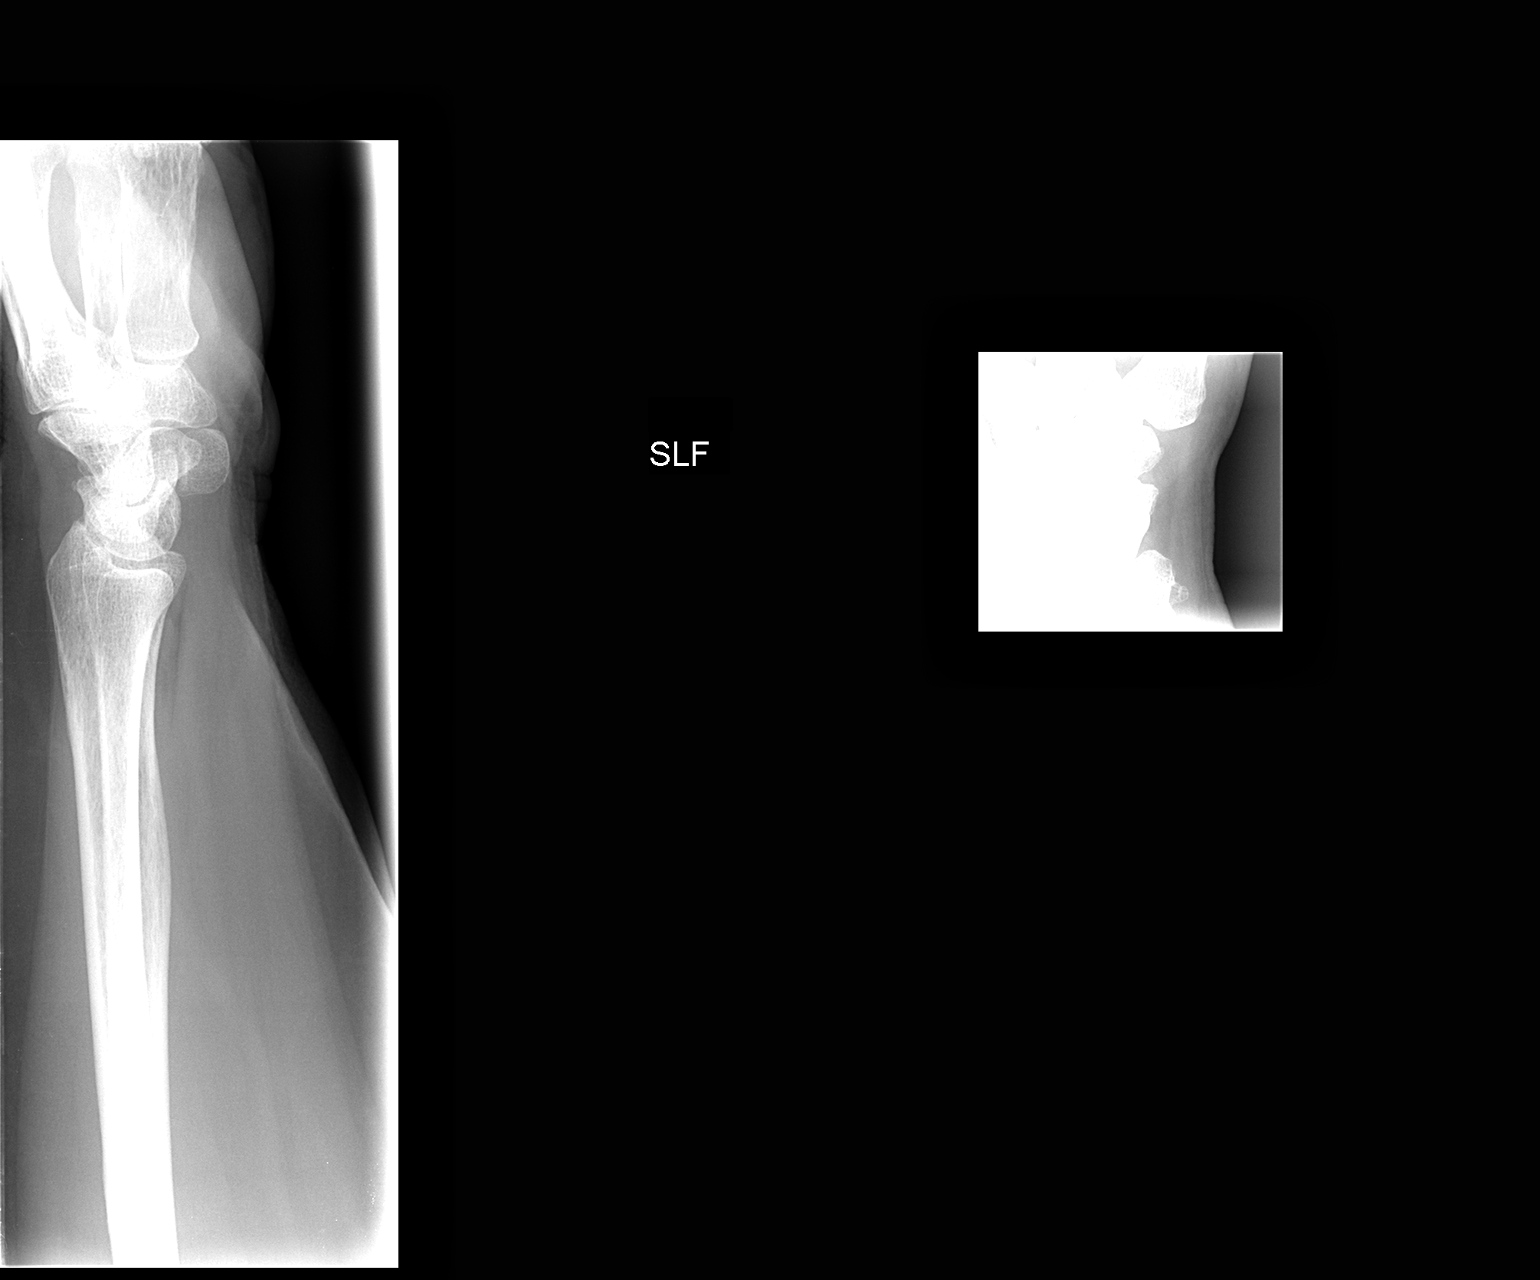

[2 of 2 positions shown; findings below may reference images not displayed]

FINDINGS: Bones are somewhat osteopenic for age.  Wrist is located.  There is an irregular enthesophyte off of the lateral aspect of the radius.  No acute fracture is identified.
IMPRESSION: 1.  Age-advanced osteopenia.  
 2.  No acute fracture.

## 2008-06-03 ENCOUNTER — Observation Stay (HOSPITAL_COMMUNITY): Admission: EM | Admit: 2008-06-03 | Discharge: 2008-06-05 | Payer: Self-pay | Admitting: *Deleted

## 2008-06-03 ENCOUNTER — Ambulatory Visit: Payer: Self-pay | Admitting: Internal Medicine

## 2008-06-09 ENCOUNTER — Ambulatory Visit: Payer: Self-pay | Admitting: Cardiology

## 2008-06-25 IMAGING — CR DG KNEE COMPLETE 4+V*R*
4 series · 4 of 4 positions shown · non-contrast
Comparison: none

CLINICAL DATA: Right knee pain with history of fall. 
 RIGHT KNEE ? 4 VIEW ? 02/08/06:

[view not recorded (1 of 4)]
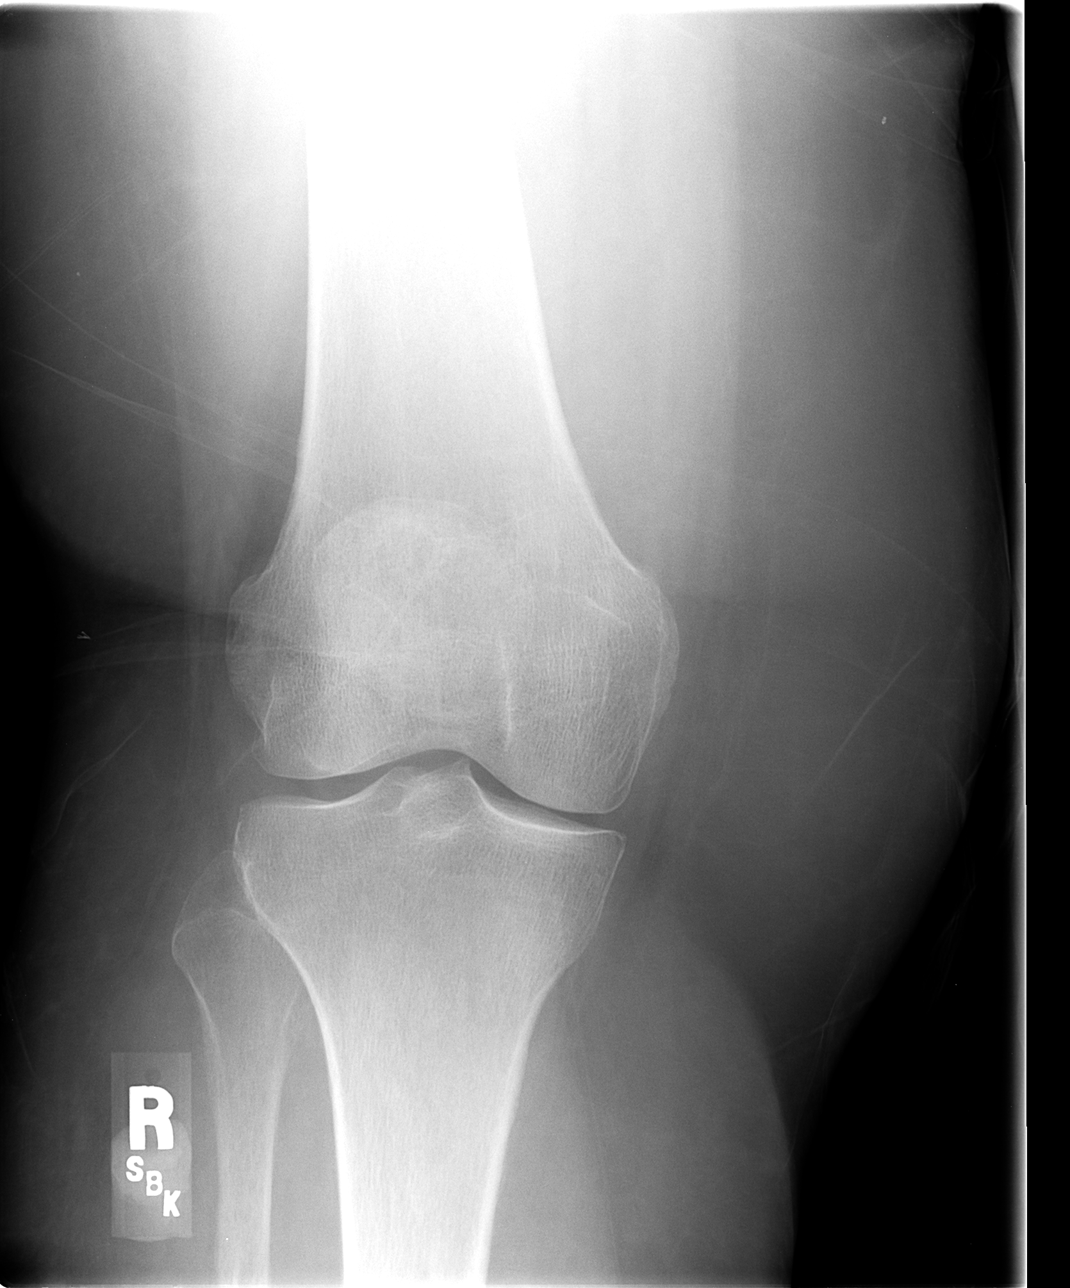

[view not recorded (2 of 4)]
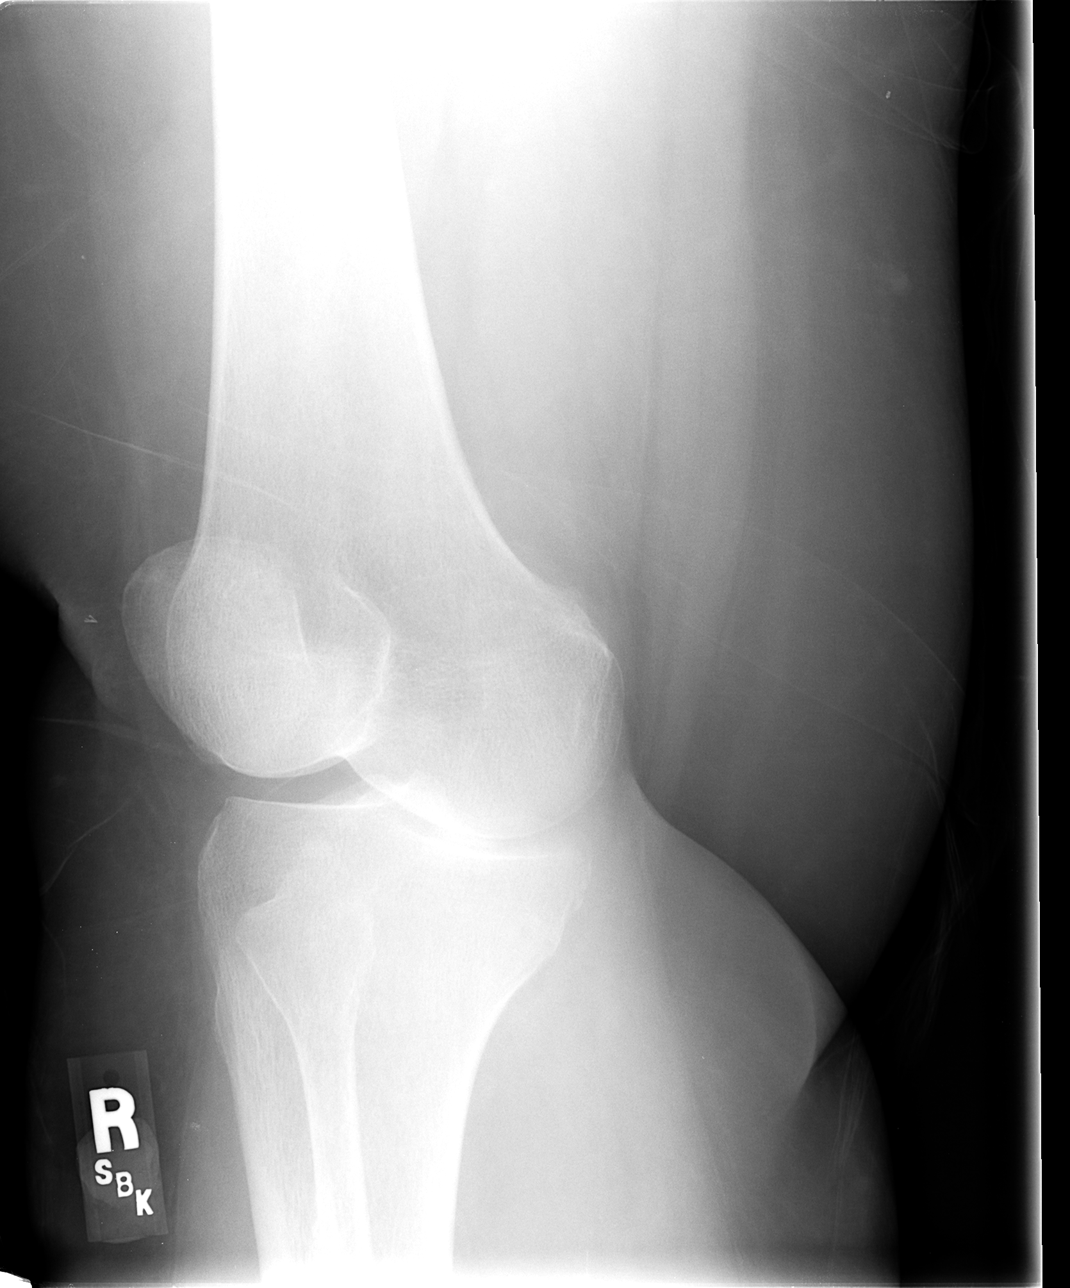

[view not recorded (3 of 4)]
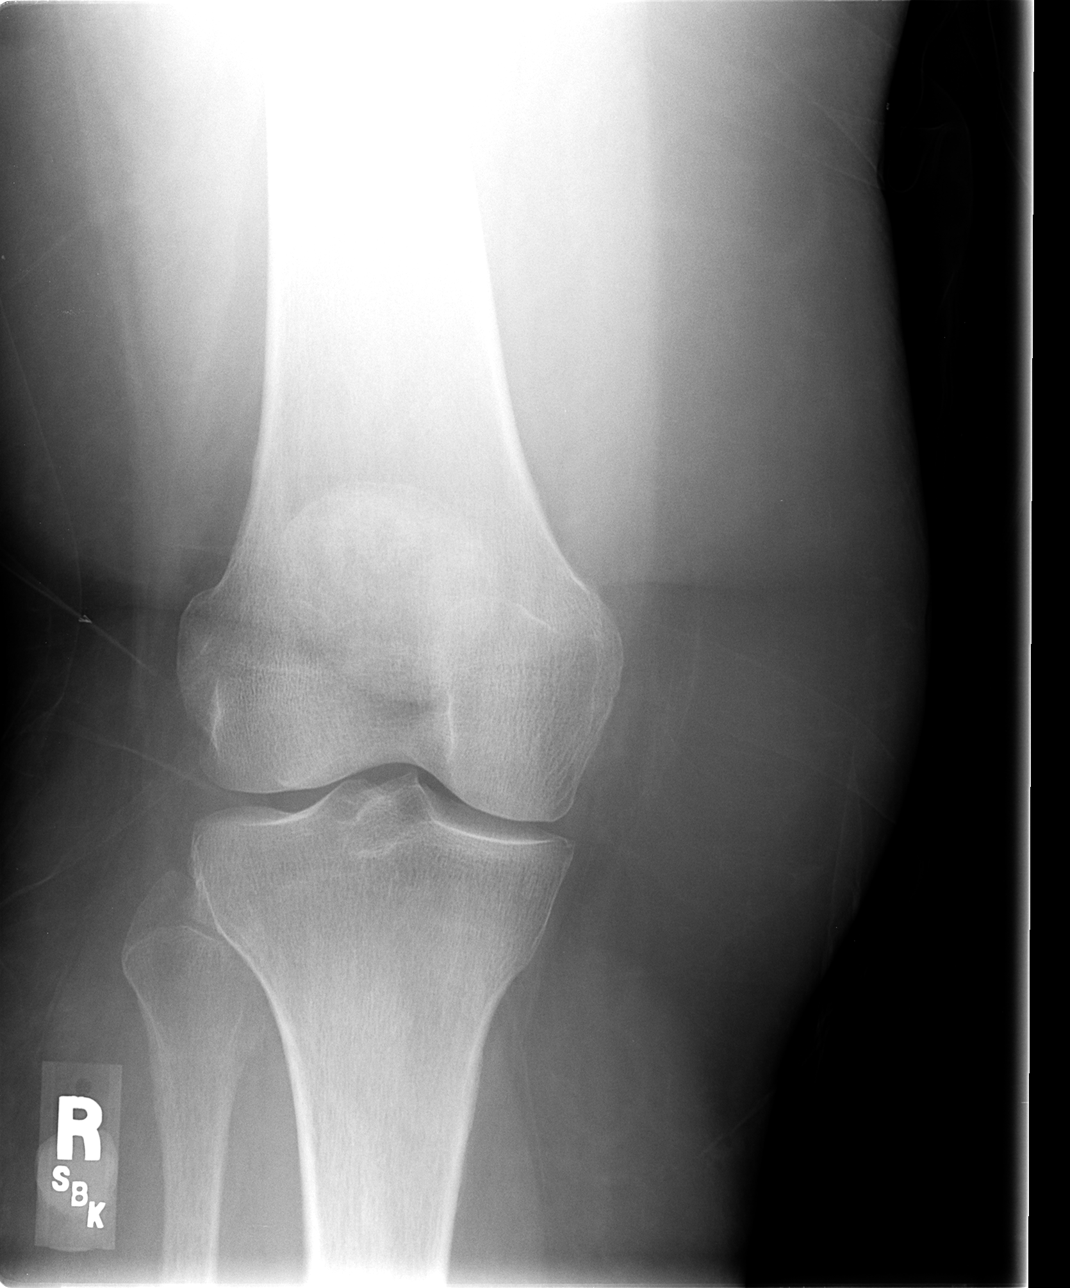

[view not recorded (4 of 4)]
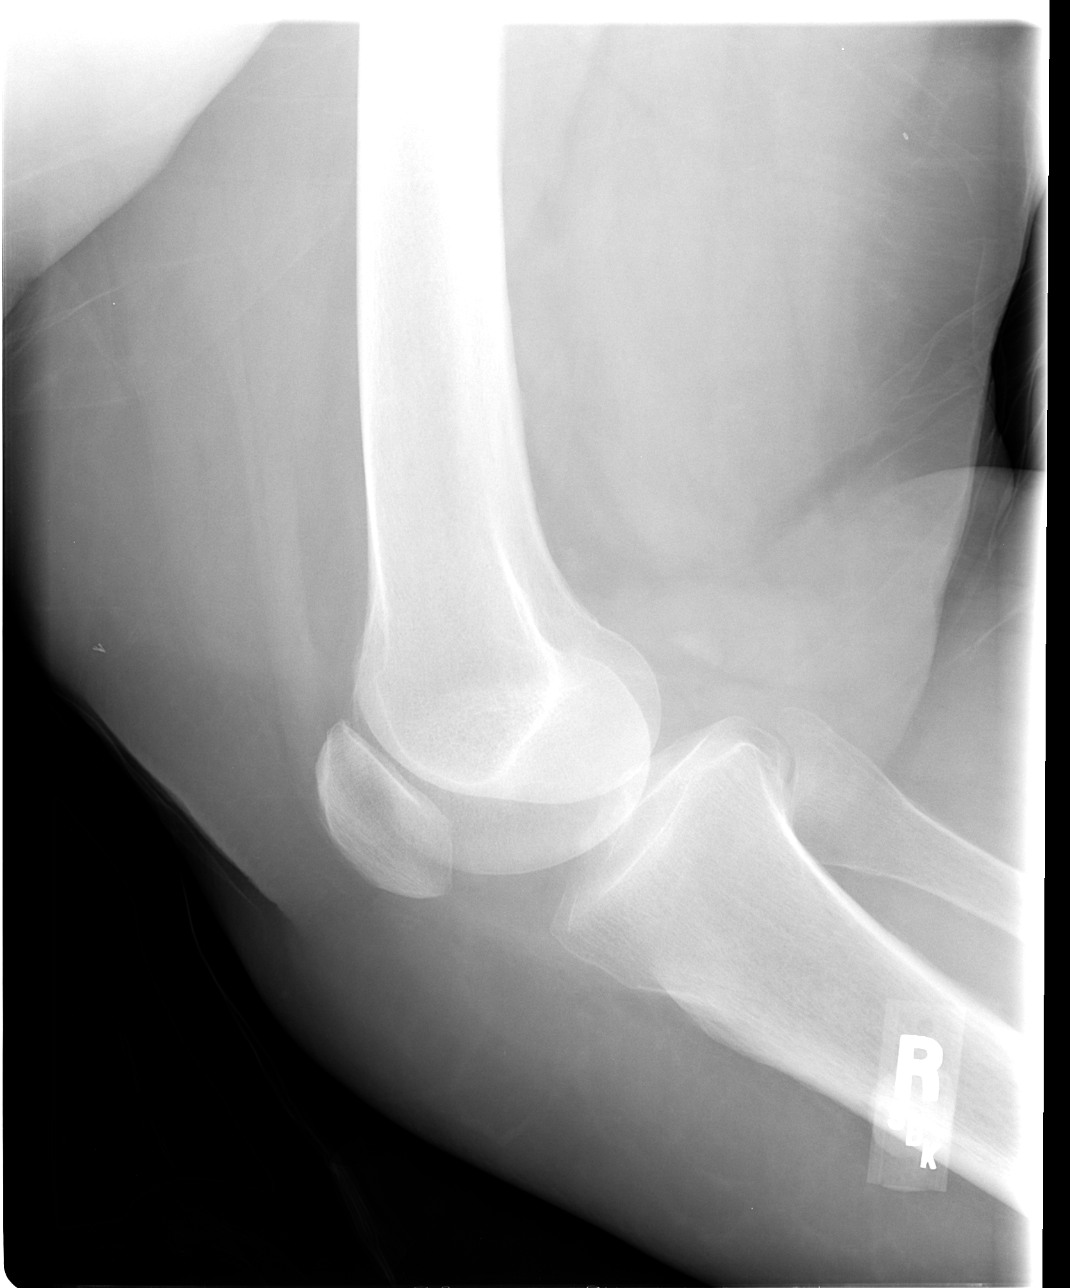

[4 of 4 positions shown; findings below may reference images not displayed]

FINDINGS: Four views of the right knee were obtained.  There is no evidence for a joint effusion.  Negative for fracture or dislocation.
IMPRESSION: No acute bony abnormality of the right knee.

## 2008-07-10 ENCOUNTER — Ambulatory Visit: Payer: Self-pay | Admitting: Cardiology

## 2008-07-21 ENCOUNTER — Ambulatory Visit: Payer: Self-pay | Admitting: Cardiology

## 2008-07-31 ENCOUNTER — Ambulatory Visit: Payer: Self-pay | Admitting: Cardiology

## 2008-08-07 ENCOUNTER — Ambulatory Visit: Payer: Self-pay | Admitting: Cardiology

## 2008-08-11 ENCOUNTER — Ambulatory Visit: Payer: Self-pay | Admitting: Cardiology

## 2008-08-13 ENCOUNTER — Emergency Department (HOSPITAL_COMMUNITY): Admission: EM | Admit: 2008-08-13 | Discharge: 2008-08-13 | Payer: Self-pay | Admitting: Emergency Medicine

## 2008-08-19 IMAGING — CT CT ABDOMEN W/ CM
2 of 5 series · 17 of 46 positions shown, 19 images · IV contrast (ORAL OMNI 350 & 100 ML OMNI 300)
Comparison: 09/20/2004

CLINICAL DATA: Abdominal pain.  
 ABDOMEN CT WITH CONTRAST:
TECHNIQUE: Multidetector CT imaging of the abdomen was performed following the standard protocol during bolus administration of intravenous contrast.
 Contrast:  100 cc Omnipaque 300 IV.  
 Oral contrast was also administered.
TECHNIQUE: Multidetector CT imaging of the pelvis was performed following the standard protocol during bolus administration of intravenous contrast.

[Series 2: routine abdomen · axial · 0.98mm/px · z∈[-506,-86]mm · 14 of 96 slices shown, 16 images]
[im 6/96  soft-tissue]
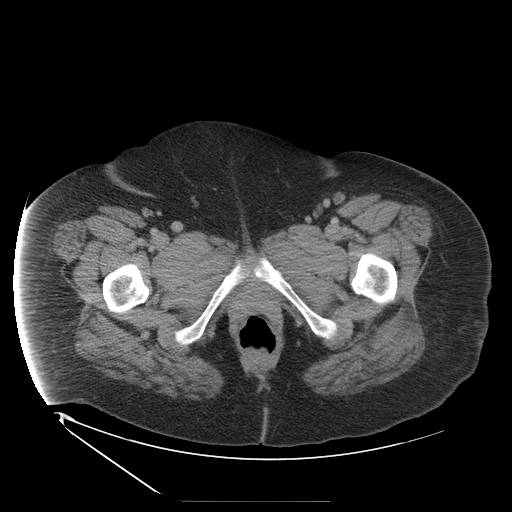
[im 6/96  bone]
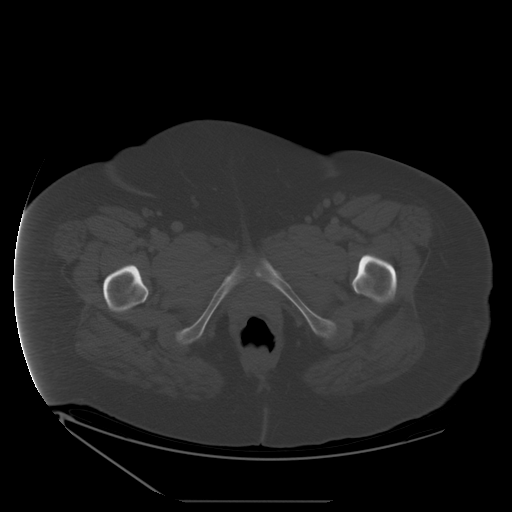
[im 11/96  soft-tissue]
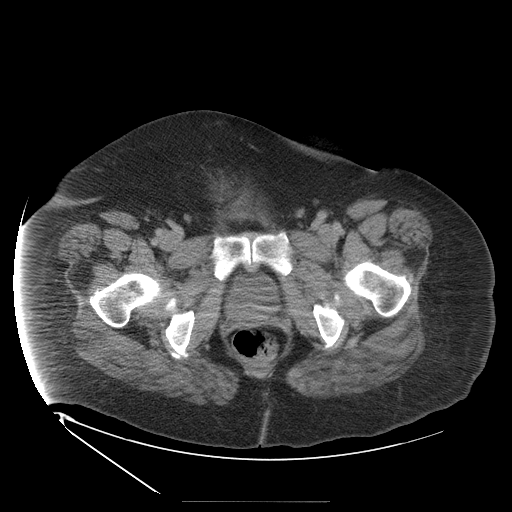
[im 22/96  soft-tissue]
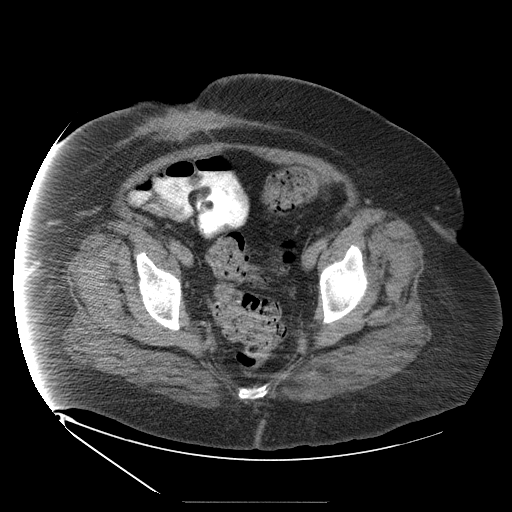
[im 27/96  soft-tissue]
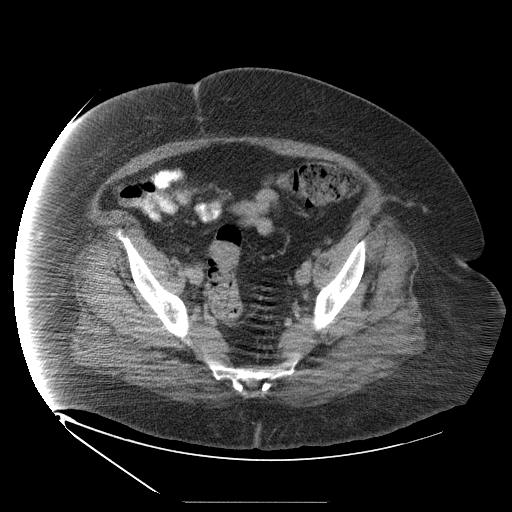
[im 32/96  soft-tissue]
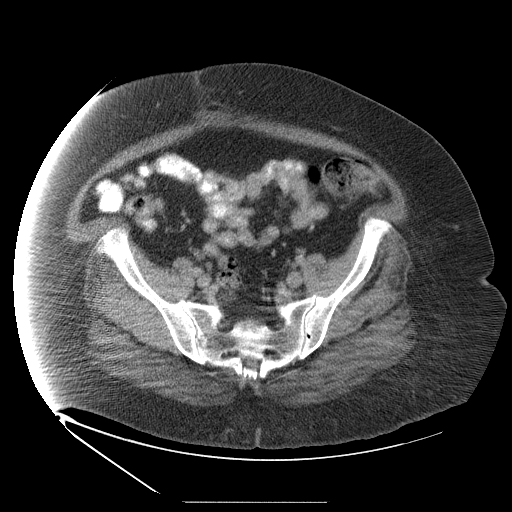
[im 37/96  soft-tissue]
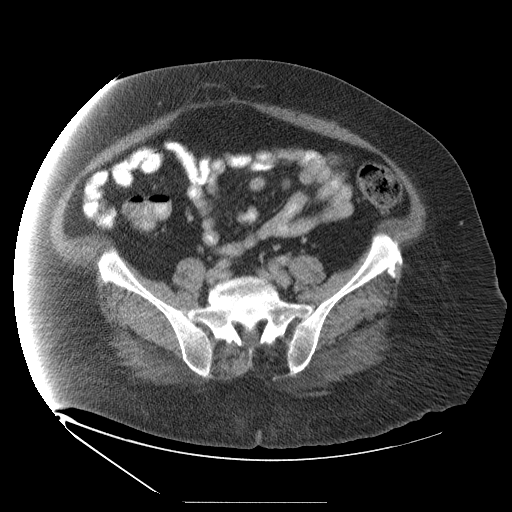
[im 43/96  soft-tissue]
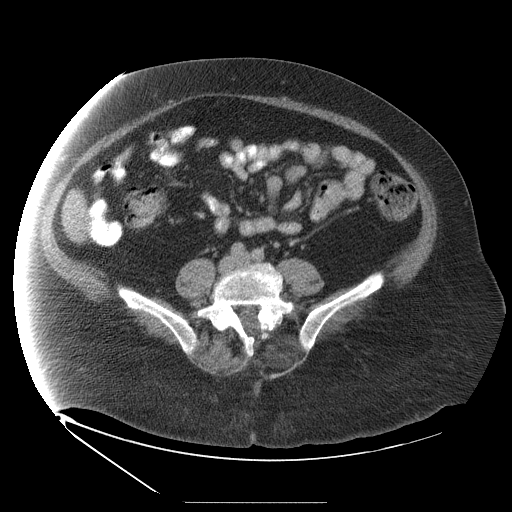
[im 53/96  soft-tissue]
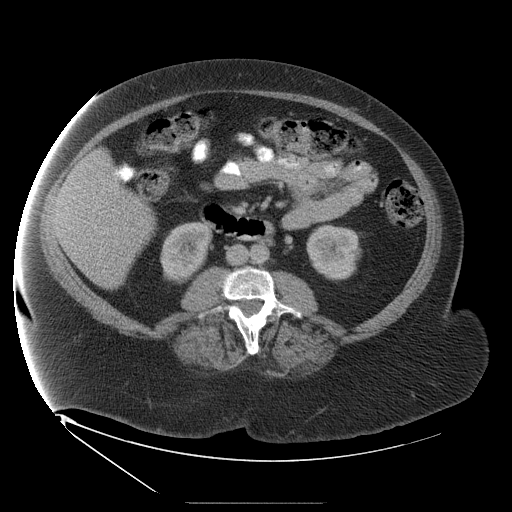
[im 59/96  soft-tissue]
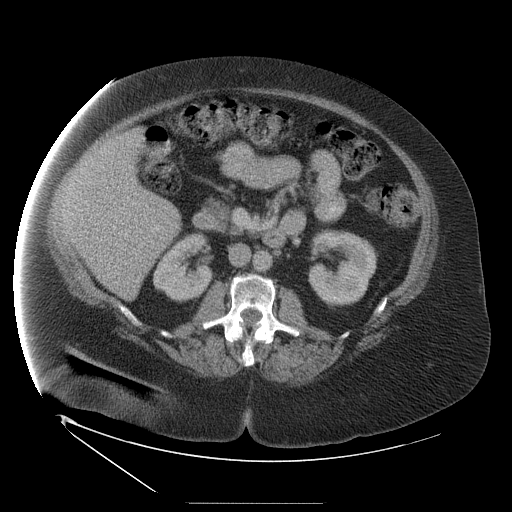
[im 59/96  bone]
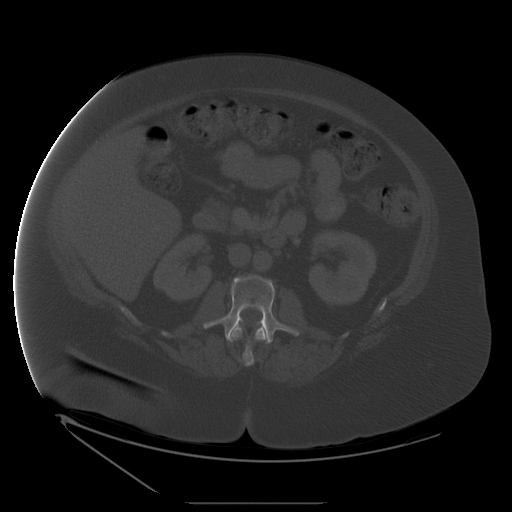
[im 64/96  soft-tissue]
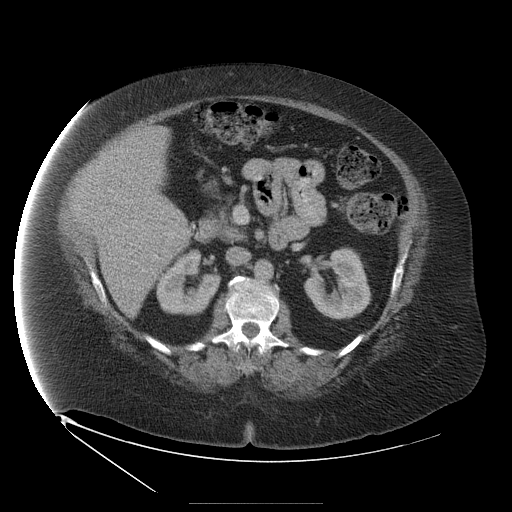
[im 69/96  soft-tissue]
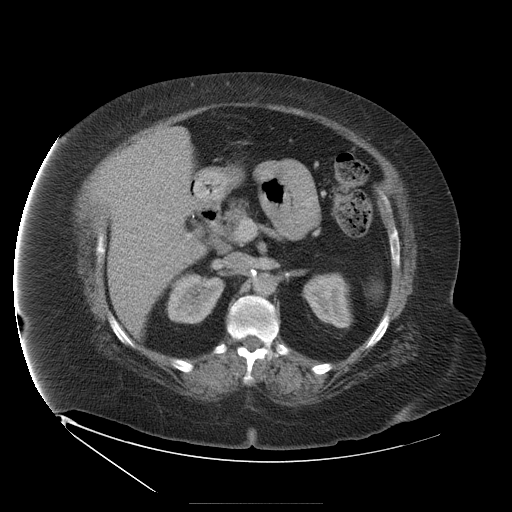
[im 74/96  soft-tissue]
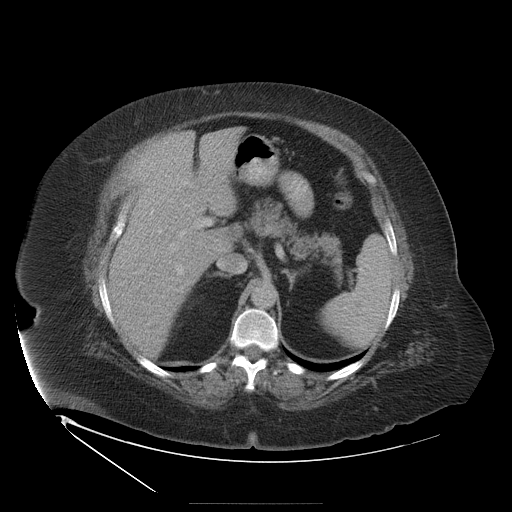
[im 85/96  soft-tissue]
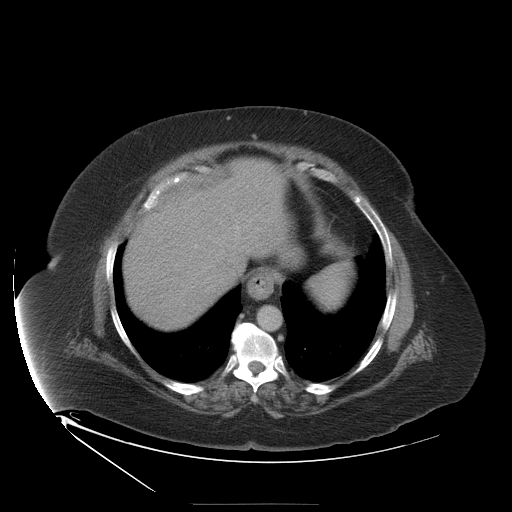
[im 90/96  soft-tissue]
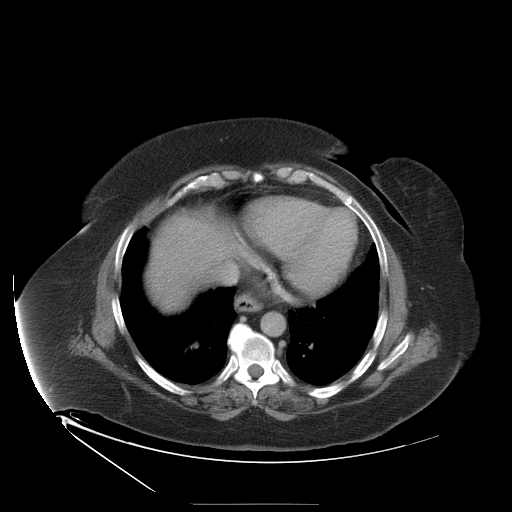

[Series 401: reformatted · coronal · 0.98mm/px · 3 of 177 slices shown]
[im 59/177  soft-tissue]
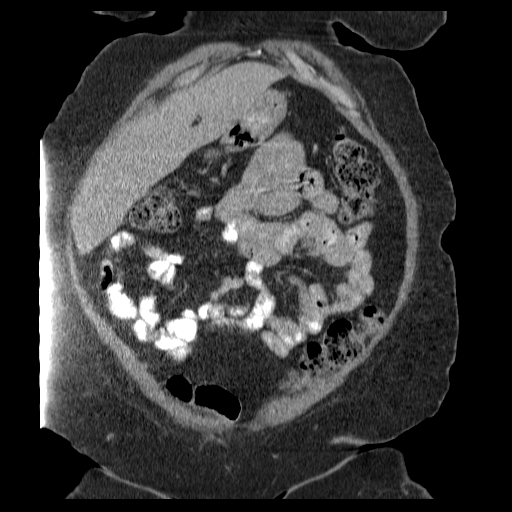
[im 79/177  soft-tissue]
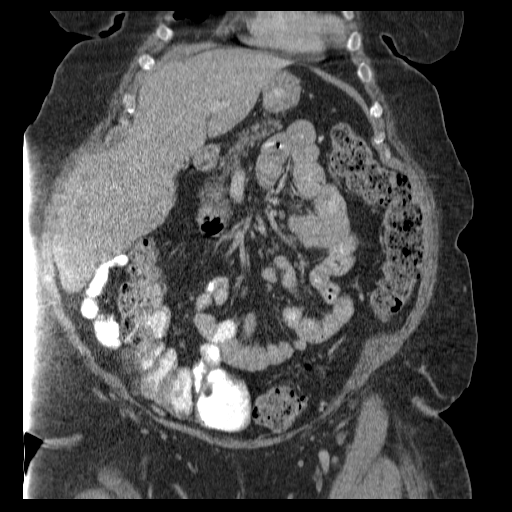
[im 98/177  soft-tissue]
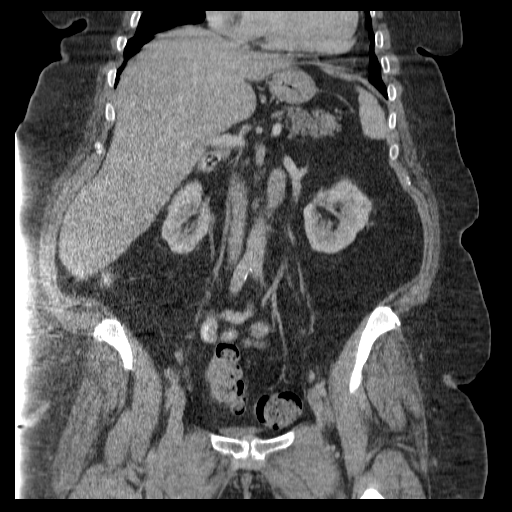

[17 of 46 positions shown; findings below may reference images not displayed]

FINDINGS: The patient has had a prior cholecystectomy.  The liver, spleen, pancreas, adrenal glands, and kidneys are unremarkable.  Bowel loops show no obstruction or inflammatory changes.  No free fluid or abscess.  No incidental enlarged lymph nodes.  No hernias are identified in the abdomen.
IMPRESSION: No acute findings in the abdomen.   
 PELVIS CT WITH CONTRAST:
FINDINGS: The patient is status post hysterectomy.  Diverticulosis noted of the sigmoid colon without evidence of diverticulitis by CT.  No free fluid or abscess is seen.  No hernias.
IMPRESSION: No acute findings in the pelvis.

## 2008-08-22 ENCOUNTER — Ambulatory Visit (HOSPITAL_COMMUNITY): Admission: RE | Admit: 2008-08-22 | Discharge: 2008-08-22 | Payer: Self-pay | Admitting: Family Medicine

## 2008-09-18 ENCOUNTER — Encounter: Admission: RE | Admit: 2008-09-18 | Discharge: 2008-09-18 | Payer: Self-pay | Admitting: Neurosurgery

## 2008-09-25 DIAGNOSIS — R197 Diarrhea, unspecified: Secondary | ICD-10-CM

## 2008-09-25 DIAGNOSIS — I10 Essential (primary) hypertension: Secondary | ICD-10-CM | POA: Insufficient documentation

## 2008-09-25 DIAGNOSIS — J984 Other disorders of lung: Secondary | ICD-10-CM

## 2008-09-25 DIAGNOSIS — Z8669 Personal history of other diseases of the nervous system and sense organs: Secondary | ICD-10-CM

## 2008-09-25 DIAGNOSIS — D509 Iron deficiency anemia, unspecified: Secondary | ICD-10-CM | POA: Insufficient documentation

## 2008-09-25 DIAGNOSIS — G4733 Obstructive sleep apnea (adult) (pediatric): Secondary | ICD-10-CM | POA: Insufficient documentation

## 2008-09-25 DIAGNOSIS — R112 Nausea with vomiting, unspecified: Secondary | ICD-10-CM

## 2008-09-25 DIAGNOSIS — K449 Diaphragmatic hernia without obstruction or gangrene: Secondary | ICD-10-CM | POA: Insufficient documentation

## 2008-09-25 DIAGNOSIS — K589 Irritable bowel syndrome without diarrhea: Secondary | ICD-10-CM

## 2008-09-25 DIAGNOSIS — K580 Irritable bowel syndrome with diarrhea: Secondary | ICD-10-CM | POA: Insufficient documentation

## 2008-09-25 DIAGNOSIS — G4730 Sleep apnea, unspecified: Secondary | ICD-10-CM

## 2008-09-25 DIAGNOSIS — K5900 Constipation, unspecified: Secondary | ICD-10-CM | POA: Insufficient documentation

## 2008-09-25 DIAGNOSIS — J9811 Atelectasis: Secondary | ICD-10-CM | POA: Insufficient documentation

## 2008-09-26 ENCOUNTER — Ambulatory Visit: Payer: Self-pay | Admitting: Gastroenterology

## 2008-09-26 DIAGNOSIS — K2270 Barrett's esophagus without dysplasia: Secondary | ICD-10-CM | POA: Insufficient documentation

## 2008-09-26 DIAGNOSIS — K3184 Gastroparesis: Secondary | ICD-10-CM | POA: Insufficient documentation

## 2008-10-02 ENCOUNTER — Ambulatory Visit: Payer: Self-pay | Admitting: Gastroenterology

## 2008-10-02 ENCOUNTER — Ambulatory Visit (HOSPITAL_COMMUNITY): Admission: RE | Admit: 2008-10-02 | Discharge: 2008-10-02 | Payer: Self-pay | Admitting: Gastroenterology

## 2008-10-09 ENCOUNTER — Ambulatory Visit: Payer: Self-pay | Admitting: Physician Assistant

## 2008-10-10 ENCOUNTER — Encounter: Payer: Self-pay | Admitting: Cardiology

## 2008-10-10 LAB — CONVERTED CEMR LAB
BUN: 16 mg/dL
Brain Natriuretic Peptide: 34
CO2: 22 meq/L
Calcium: 9.3 mg/dL
Chloride: 103 meq/L
Creatinine, Ser: 0.68 mg/dL
GFR calc non Af Amer: 9 mL/min
Glucose, Bld: 332 mg/dL
HCT: 36.3 %
Hemoglobin: 11.2 g/dL
MCV: 80.5 fL
Platelets: 326 K/uL
Potassium: 4.4 meq/L
Sodium: 136 meq/L
WBC: 7.7 10*3/microliter

## 2008-10-23 ENCOUNTER — Ambulatory Visit: Payer: Self-pay | Admitting: Cardiology

## 2008-11-21 ENCOUNTER — Ambulatory Visit: Payer: Self-pay | Admitting: Cardiology

## 2008-12-04 ENCOUNTER — Ambulatory Visit: Payer: Self-pay | Admitting: Cardiology

## 2008-12-19 ENCOUNTER — Ambulatory Visit (HOSPITAL_COMMUNITY): Admission: RE | Admit: 2008-12-19 | Discharge: 2008-12-19 | Payer: Self-pay | Admitting: Family Medicine

## 2009-01-01 ENCOUNTER — Ambulatory Visit: Payer: Self-pay | Admitting: Cardiology

## 2009-02-05 ENCOUNTER — Encounter: Payer: Self-pay | Admitting: Cardiology

## 2009-02-05 ENCOUNTER — Encounter: Admission: RE | Admit: 2009-02-05 | Discharge: 2009-02-05 | Payer: Self-pay | Admitting: Neurosurgery

## 2009-02-05 ENCOUNTER — Ambulatory Visit: Payer: Self-pay

## 2009-02-06 ENCOUNTER — Encounter: Admission: RE | Admit: 2009-02-06 | Discharge: 2009-02-06 | Payer: Self-pay | Admitting: Neurosurgery

## 2009-02-11 ENCOUNTER — Ambulatory Visit: Payer: Self-pay | Admitting: Cardiology

## 2009-02-12 ENCOUNTER — Inpatient Hospital Stay (HOSPITAL_COMMUNITY): Admission: EM | Admit: 2009-02-12 | Discharge: 2009-02-13 | Payer: Self-pay | Admitting: Emergency Medicine

## 2009-02-12 ENCOUNTER — Encounter (INDEPENDENT_AMBULATORY_CARE_PROVIDER_SITE_OTHER): Payer: Self-pay | Admitting: *Deleted

## 2009-02-12 LAB — CONVERTED CEMR LAB
CO2: 23 meq/L
Creatinine, Ser: 1.05 mg/dL
GFR calc non Af Amer: 54 mL/min
Glucose, Bld: 167 mg/dL
HCT: 35.4 %
Hemoglobin: 11.4 g/dL
Magnesium: 1.5 mg/dL
Platelets: 220 10*3/uL
WBC: 9 10*3/uL

## 2009-02-16 ENCOUNTER — Encounter: Payer: Self-pay | Admitting: Cardiology

## 2009-02-16 ENCOUNTER — Ambulatory Visit: Payer: Self-pay | Admitting: Cardiology

## 2009-02-16 ENCOUNTER — Encounter: Payer: Self-pay | Admitting: *Deleted

## 2009-02-16 LAB — CONVERTED CEMR LAB: POC INR: 2.2

## 2009-03-02 IMAGING — CR DG CHEST 1V PORT
1 series · 1 of 1 positions shown · non-contrast
Comparison: 10/11/03.

CLINICAL DATA: Chest pain/shortness of breath.  
PORTABLE CHEST ? 1 VIEW:

[view not recorded]
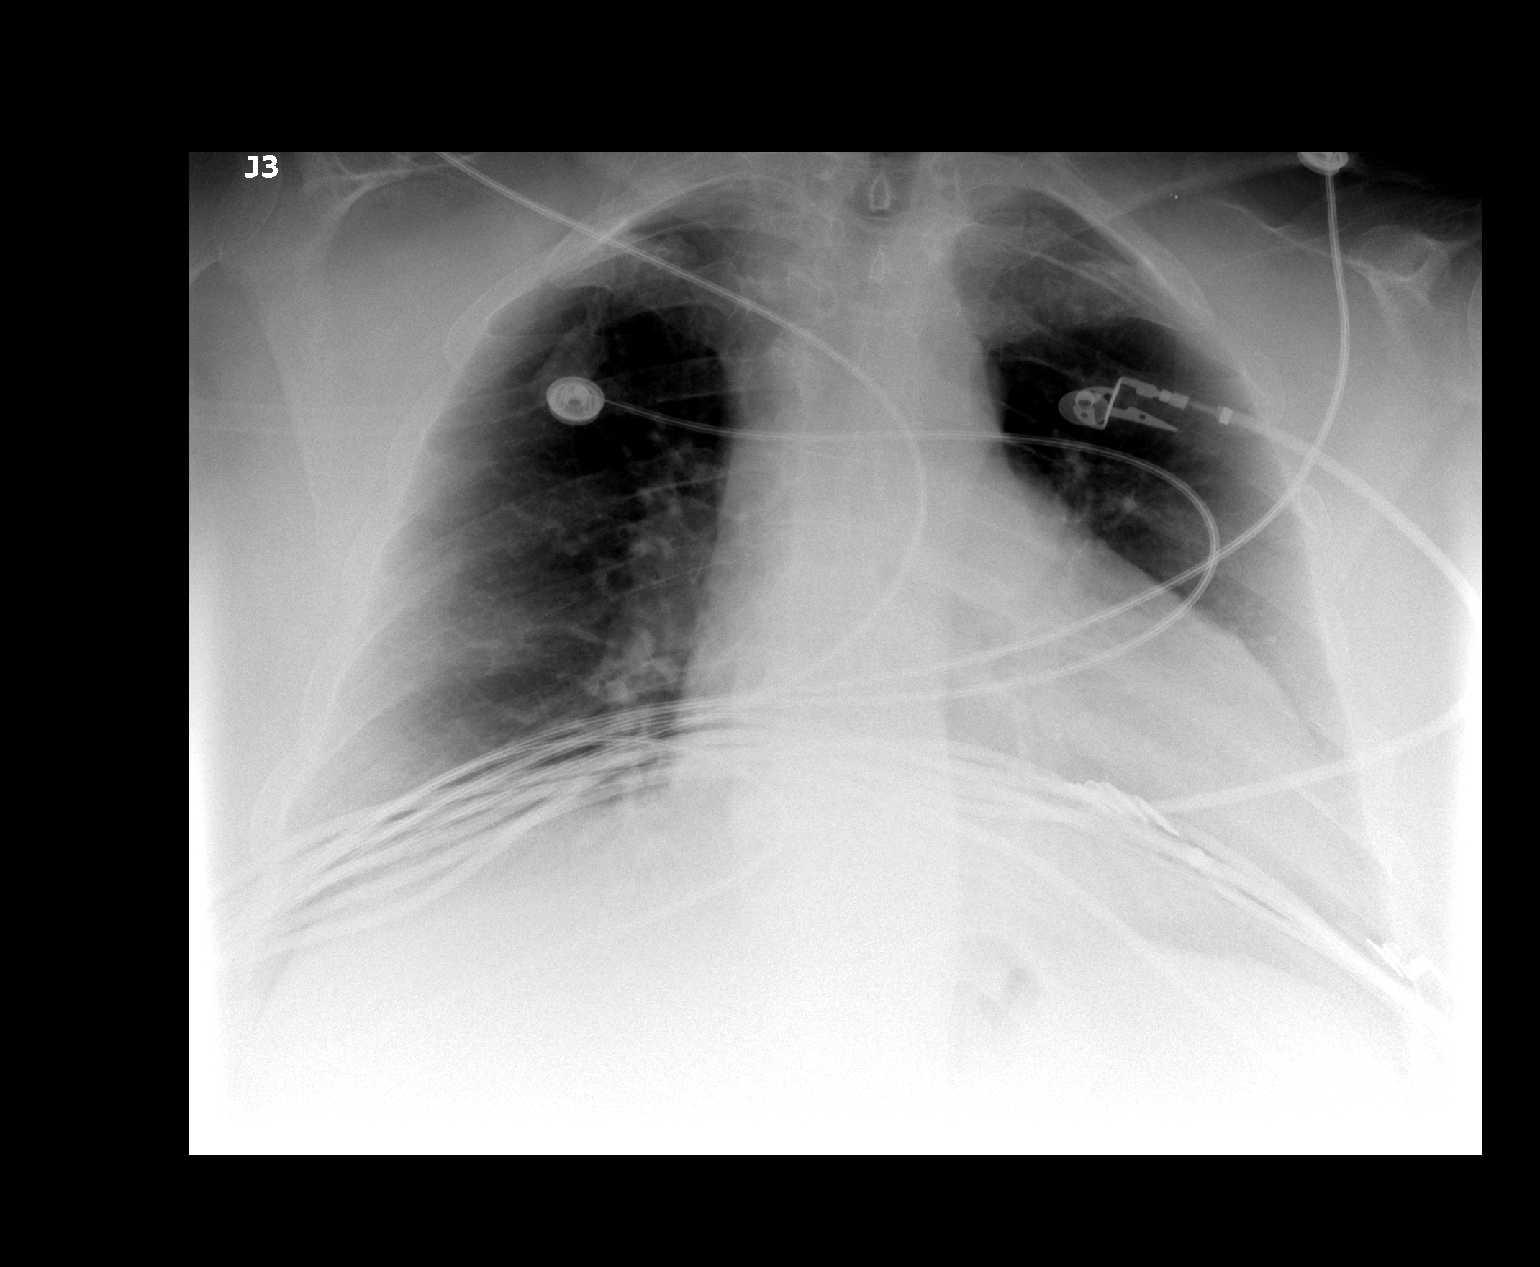

[1 of 1 positions shown; findings below may reference images not displayed]

FINDINGS: Heart is prominent.  No frank congestive heart failure.  no definite active disease but multiple monitor wires obscure the lung bases, especially the right.
IMPRESSION: 1.  Cardiomegaly without frank congestive heart failure.
2.  Lungs are clear except at the lung bases cannot be adequately evaluated due to overlying artifacts.

## 2009-03-02 IMAGING — CT CT ANGIO CHEST
4 of 6 series · 19 of 30 positions shown · IV contrast (100 ML OMNI 300)
Comparison: none

CLINICAL DATA: Chest pain and shortness of breath.  Elevated D-dimer.  Clinical suspicion for pulmonary embolism. 
 CT ANGIOGRAPHY OF CHEST:
TECHNIQUE: Multidetector CT imaging of the chest was performed during bolus injection of intravenous contrast.  Multiplanar CT angiographic image reconstructions were generated to evaluate the vascular anatomy.
 Contrast:  120 cc Omnipaque 300

[Series 4: pe · axial · 0.70mm/px · z∈[-280,-74]mm · 8 of 221 slices shown]
[im 28/221  lung]
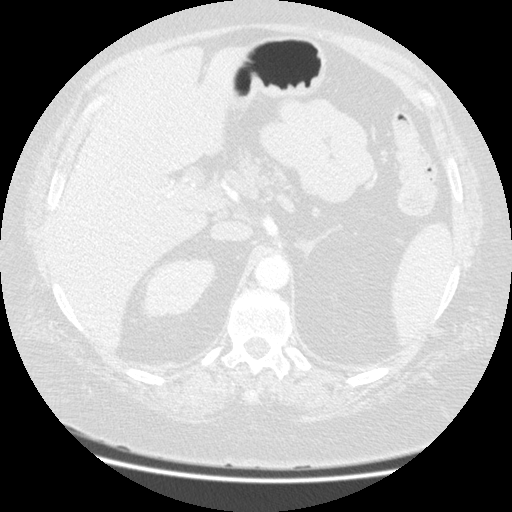
[im 56/221  mediastinal]
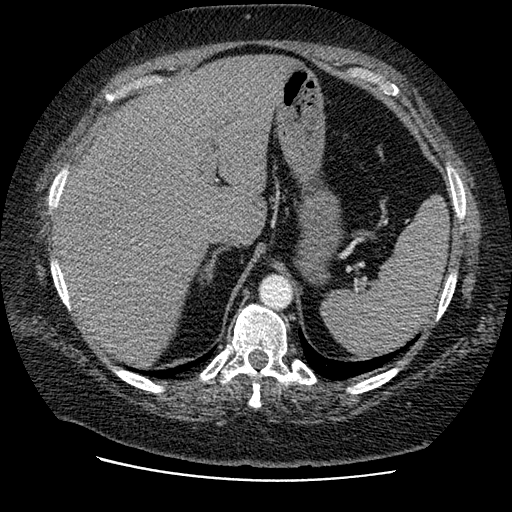
[im 83/221  lung]
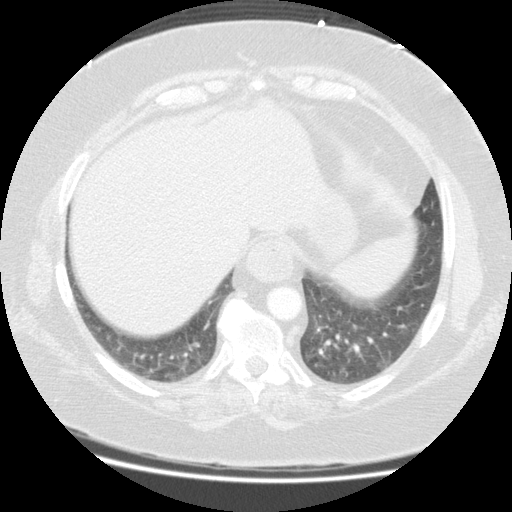
[im 111/221  mediastinal]
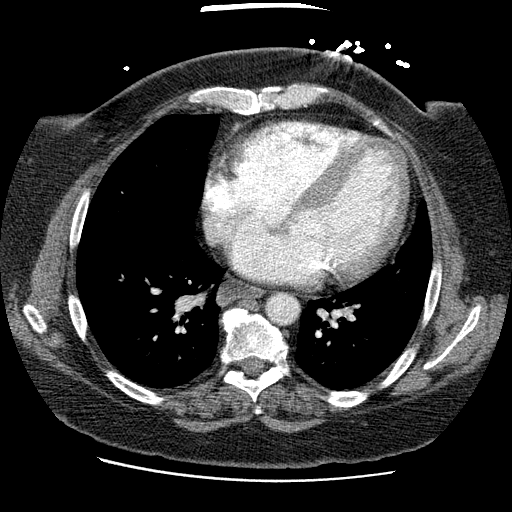
[im 120/221  lung]
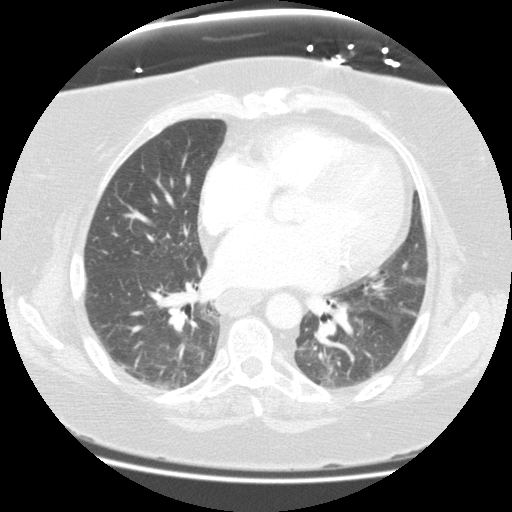
[im 138/221  mediastinal]
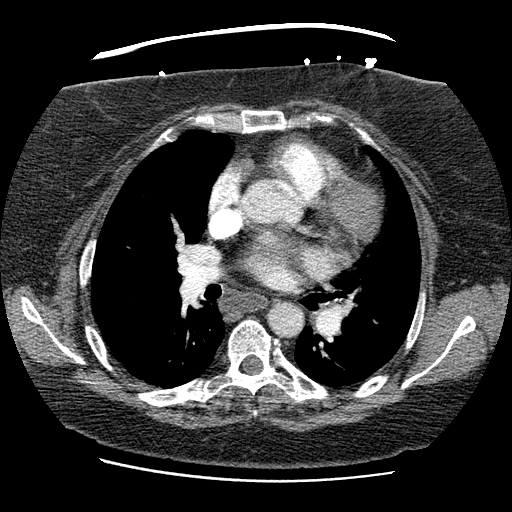
[im 166/221  lung]
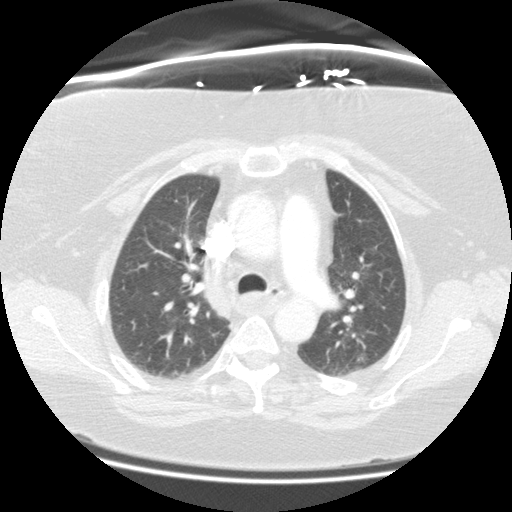
[im 193/221  mediastinal]
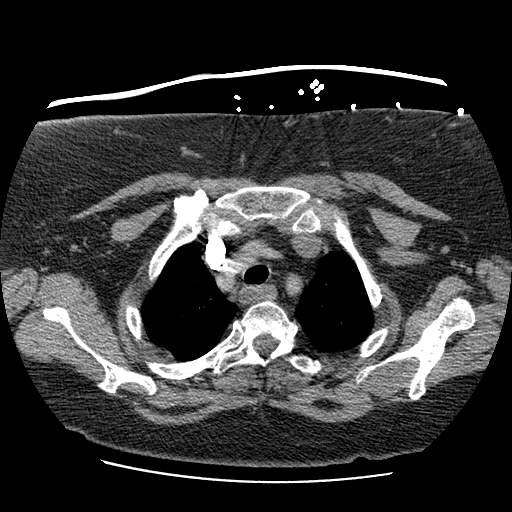

[Series 5: recon 2: pe · axial · 0.70mm/px · z∈[-224,-131]mm · 3 of 111 slices shown]
[im 37/111  lung]
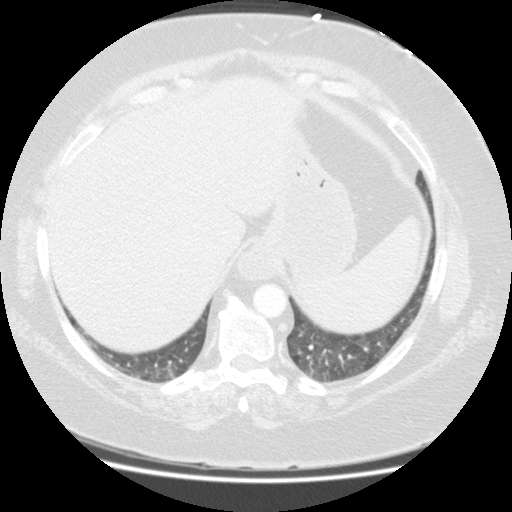
[im 61/111  lung]
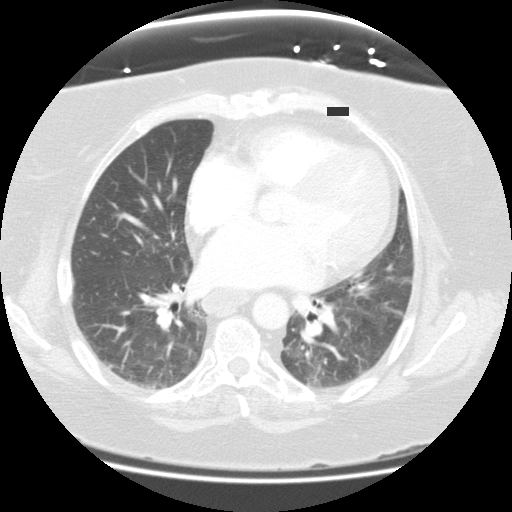
[im 74/111  lung]
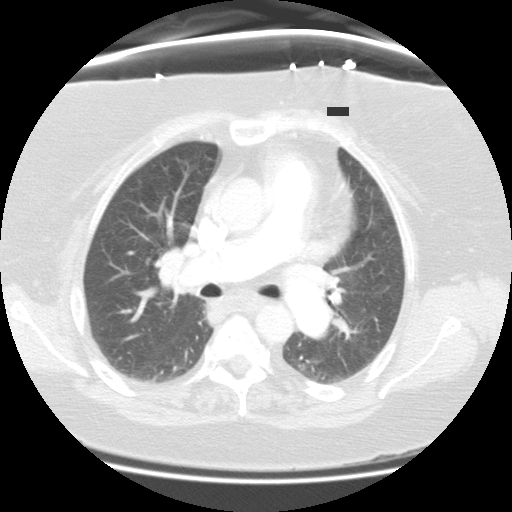

[Series 400: reformatted · coronal · 0.70mm/px · 3 of 139 slices shown (1 of 2)]
[im 28/139  lung]
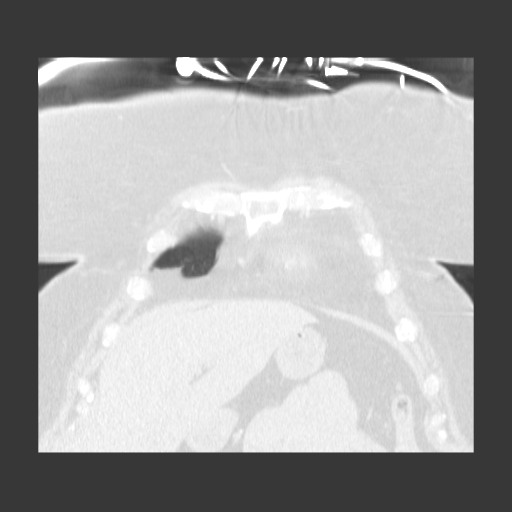
[im 56/139  lung]
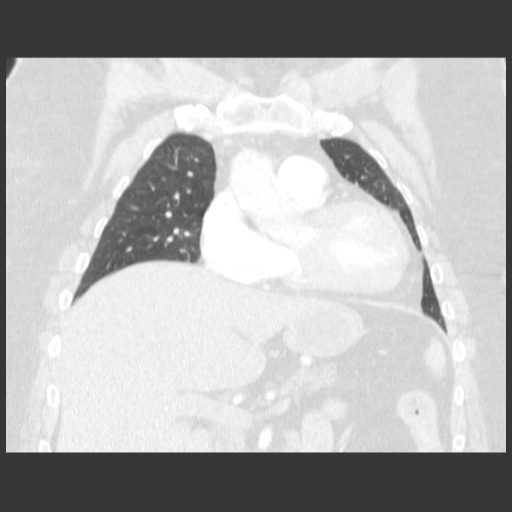
[im 83/139  lung]
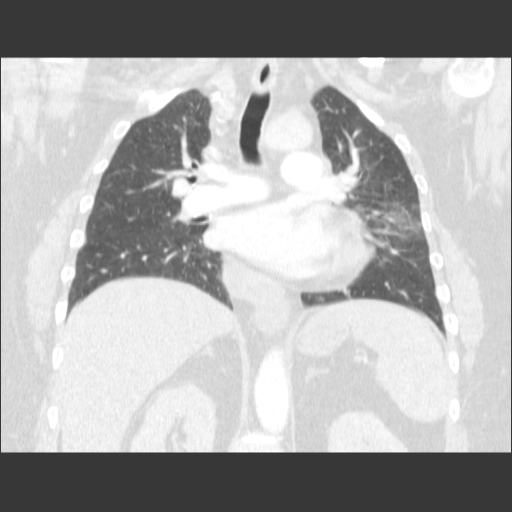

[Series 401: reformatted · sagittal · 0.70mm/px · 5 of 157 slices shown (2 of 2)]
[im 27/157  lung]
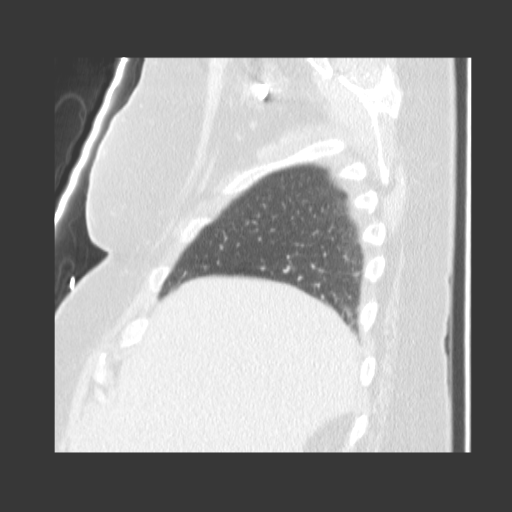
[im 53/157  lung]
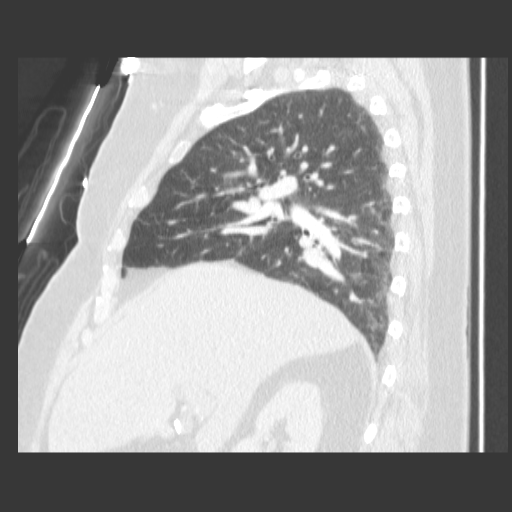
[im 79/157  lung]
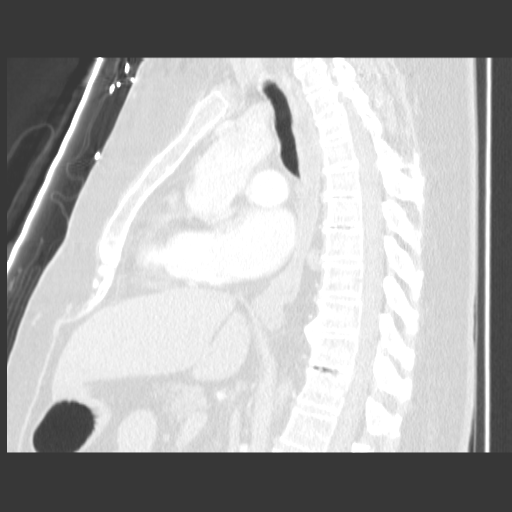
[im 105/157  lung]
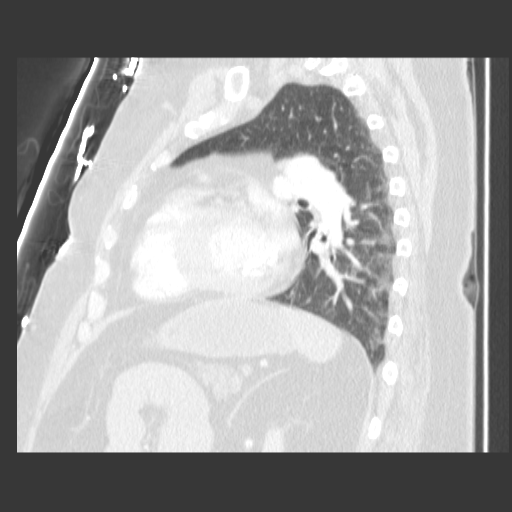
[im 131/157  lung]
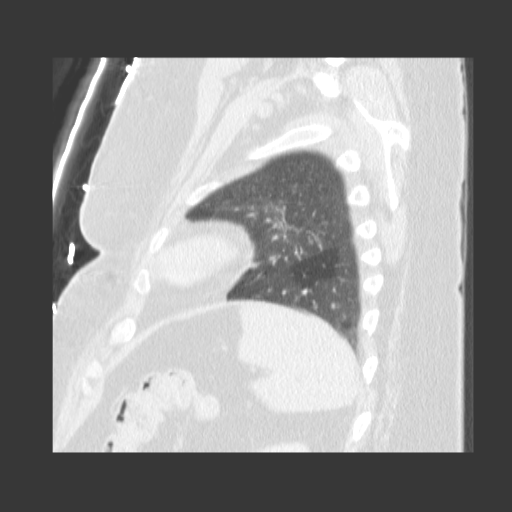

[19 of 30 positions shown; findings below may reference images not displayed]

FINDINGS: Satisfactory opacification of the pulmonary arteries is seen, and there is no evidence of pulmonary embolism.  There is no evidence of thoracic aortic aneurysm or dissection.  There is no evidence of hilar or mediastinal masses.  
 There is no evidence of pleural or pericardial effusion.  There is no evidence of pulmonary infiltrate or mass.  Small hiatal hernia is noted.  Heart size is upper limits of normal.
IMPRESSION: 1.  No evidence of acute pulmonary embolism.  
 2.  Small hiatal hernia.

## 2009-03-04 ENCOUNTER — Encounter: Payer: Self-pay | Admitting: Cardiology

## 2009-03-04 ENCOUNTER — Encounter (INDEPENDENT_AMBULATORY_CARE_PROVIDER_SITE_OTHER): Payer: Self-pay | Admitting: *Deleted

## 2009-03-04 DIAGNOSIS — E785 Hyperlipidemia, unspecified: Secondary | ICD-10-CM | POA: Insufficient documentation

## 2009-03-16 ENCOUNTER — Ambulatory Visit: Payer: Self-pay

## 2009-03-16 LAB — CONVERTED CEMR LAB: POC INR: 2.3

## 2009-03-31 ENCOUNTER — Ambulatory Visit: Payer: Self-pay | Admitting: Cardiology

## 2009-03-31 ENCOUNTER — Encounter (INDEPENDENT_AMBULATORY_CARE_PROVIDER_SITE_OTHER): Payer: Self-pay | Admitting: *Deleted

## 2009-04-09 ENCOUNTER — Encounter: Payer: Self-pay | Admitting: Cardiology

## 2009-04-09 LAB — CONVERTED CEMR LAB
Albumin: 4 g/dL (ref 3.5–5.2)
CO2: 19 meq/L (ref 19–32)
Cholesterol: 150 mg/dL (ref 0–200)
Glucose, Bld: 227 mg/dL — ABNORMAL HIGH (ref 70–99)
Potassium: 4.3 meq/L (ref 3.5–5.3)
Sodium: 141 meq/L (ref 135–145)
Total Protein: 6.4 g/dL (ref 6.0–8.3)
Triglycerides: 193 mg/dL — ABNORMAL HIGH (ref <150)

## 2009-04-13 ENCOUNTER — Encounter (INDEPENDENT_AMBULATORY_CARE_PROVIDER_SITE_OTHER): Payer: Self-pay | Admitting: *Deleted

## 2009-04-15 ENCOUNTER — Ambulatory Visit: Payer: Self-pay | Admitting: Cardiology

## 2009-04-29 ENCOUNTER — Ambulatory Visit: Payer: Self-pay | Admitting: Cardiology

## 2009-04-29 LAB — CONVERTED CEMR LAB: POC INR: 2.1

## 2009-05-02 ENCOUNTER — Encounter (INDEPENDENT_AMBULATORY_CARE_PROVIDER_SITE_OTHER): Payer: Self-pay | Admitting: *Deleted

## 2009-05-02 LAB — CONVERTED CEMR LAB
AST: 16 units/L
Alkaline Phosphatase: 64 units/L
BUN: 15 mg/dL
Calcium: 9.4 mg/dL
Cholesterol: 141 mg/dL
Glucose, Bld: 245 mg/dL
Hgb A1c MFr Bld: 8.6 %
Triglycerides: 190 mg/dL

## 2009-05-30 ENCOUNTER — Inpatient Hospital Stay (HOSPITAL_COMMUNITY): Admission: EM | Admit: 2009-05-30 | Discharge: 2009-06-02 | Payer: Self-pay | Admitting: Emergency Medicine

## 2009-05-30 ENCOUNTER — Ambulatory Visit: Payer: Self-pay | Admitting: Vascular Surgery

## 2009-05-30 ENCOUNTER — Encounter (INDEPENDENT_AMBULATORY_CARE_PROVIDER_SITE_OTHER): Payer: Self-pay | Admitting: Internal Medicine

## 2009-05-30 ENCOUNTER — Ambulatory Visit: Payer: Self-pay | Admitting: Internal Medicine

## 2009-06-01 ENCOUNTER — Encounter (INDEPENDENT_AMBULATORY_CARE_PROVIDER_SITE_OTHER): Payer: Self-pay | Admitting: *Deleted

## 2009-06-01 ENCOUNTER — Encounter (INDEPENDENT_AMBULATORY_CARE_PROVIDER_SITE_OTHER): Payer: Self-pay | Admitting: Internal Medicine

## 2009-06-01 LAB — CONVERTED CEMR LAB
BUN: 12 mg/dL
CO2: 27 meq/L
Chloride: 106 meq/L
Creatinine, Ser: 0.58 mg/dL

## 2009-06-04 ENCOUNTER — Ambulatory Visit: Payer: Self-pay | Admitting: Cardiology

## 2009-06-04 LAB — CONVERTED CEMR LAB: POC INR: 2.2

## 2009-06-10 IMAGING — CR DG SMALL BOWEL
11 series · 11 of 11 positions shown · non-contrast
Comparison: none

CLINICAL DATA: 55-year-old with abdominal pain.  Upper endoscopy shows hiatal hernia.  
SMALL BOWEL FOLLOW THROUGH:
TECHNIQUE: Following scout view, single contrast small bowel follow through was performed.

[run (1 of 5)]
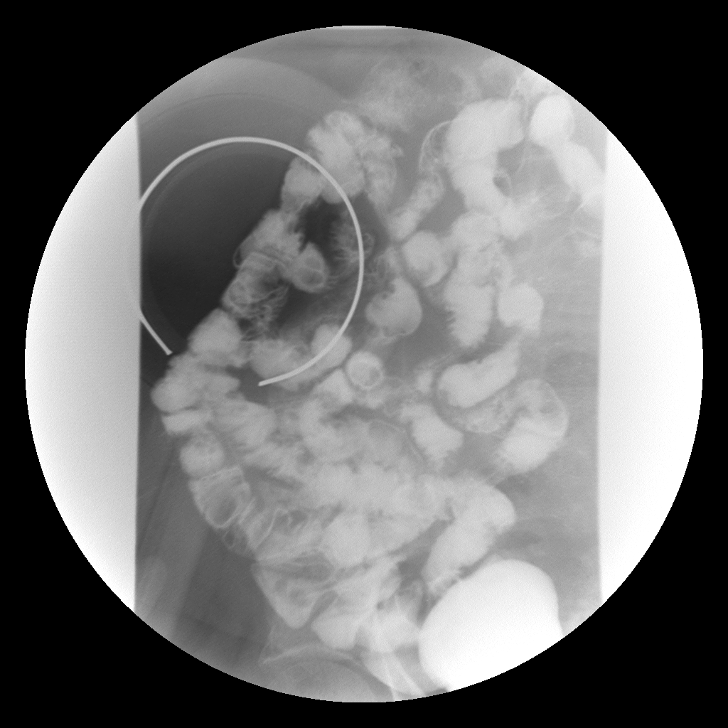

[run (2 of 5)]
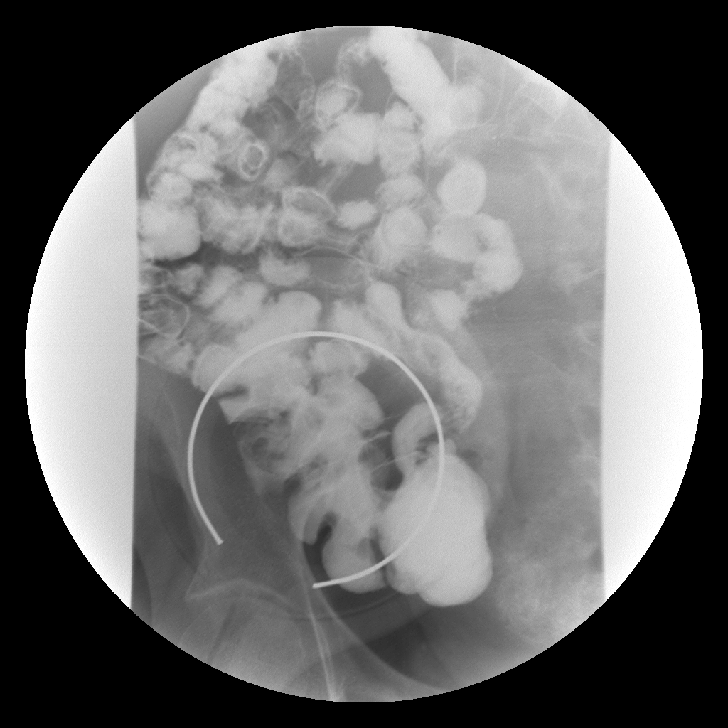

[run (3 of 5)]
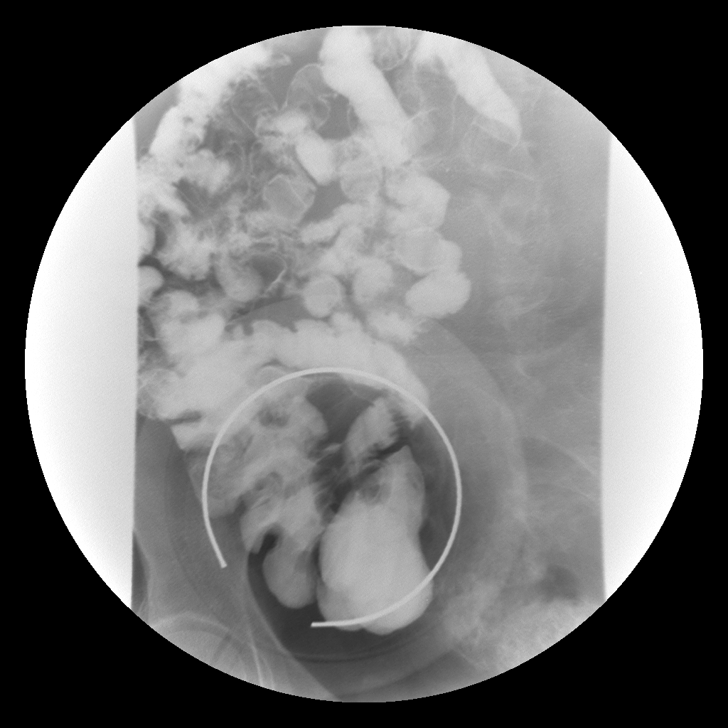

[run (4 of 5)]
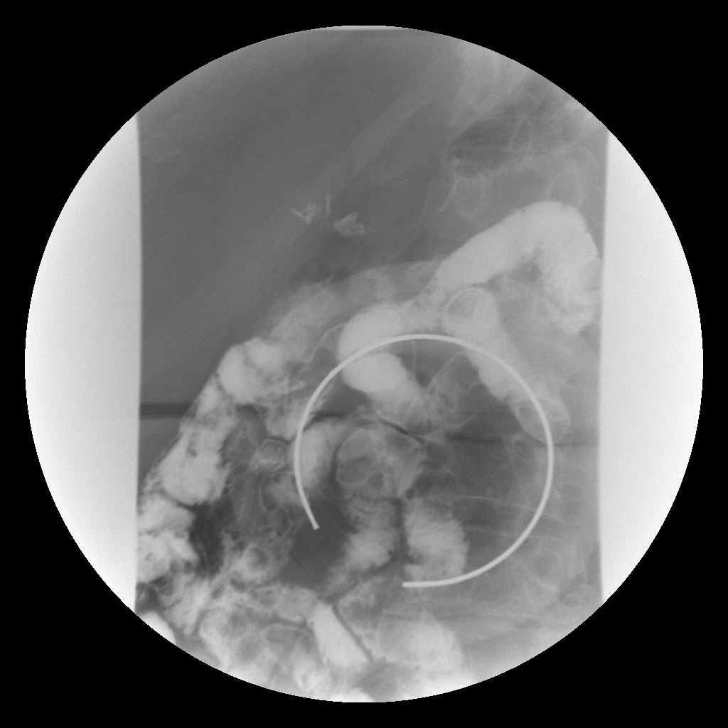

[run (5 of 5)]
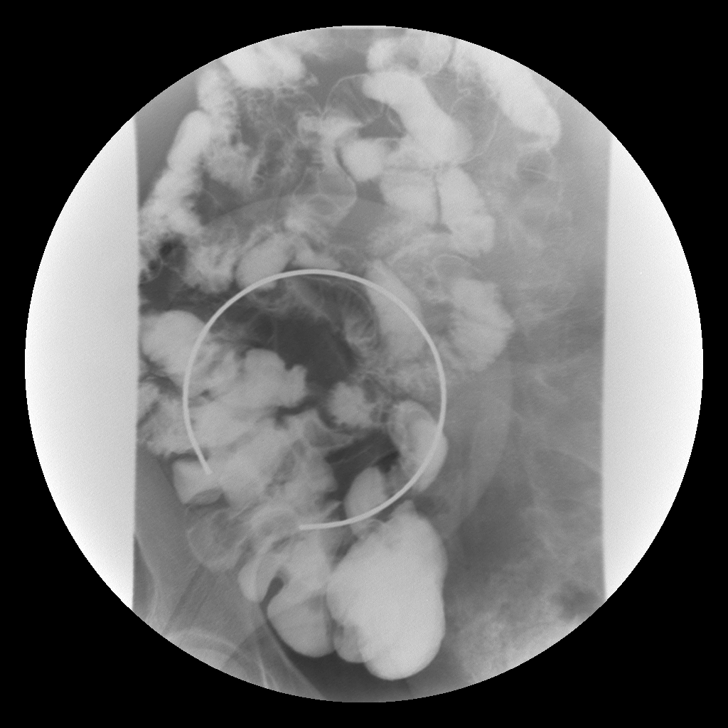

[view not recorded (1 of 6)]
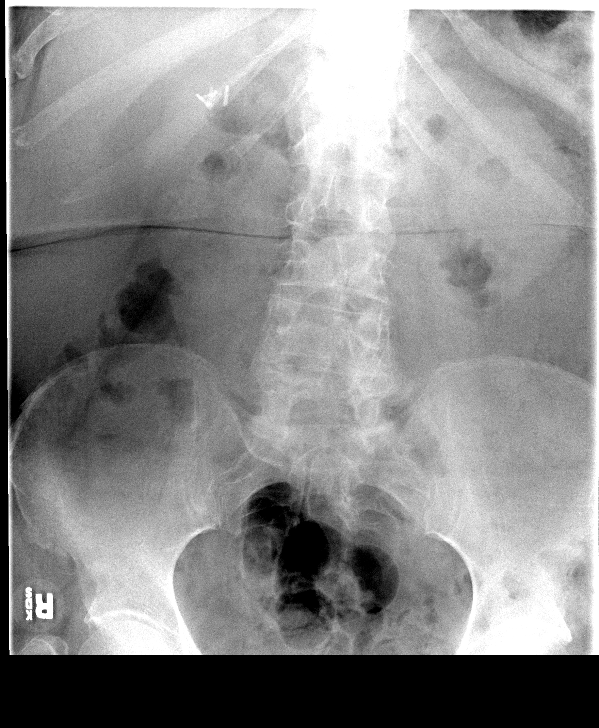

[view not recorded (2 of 6)]
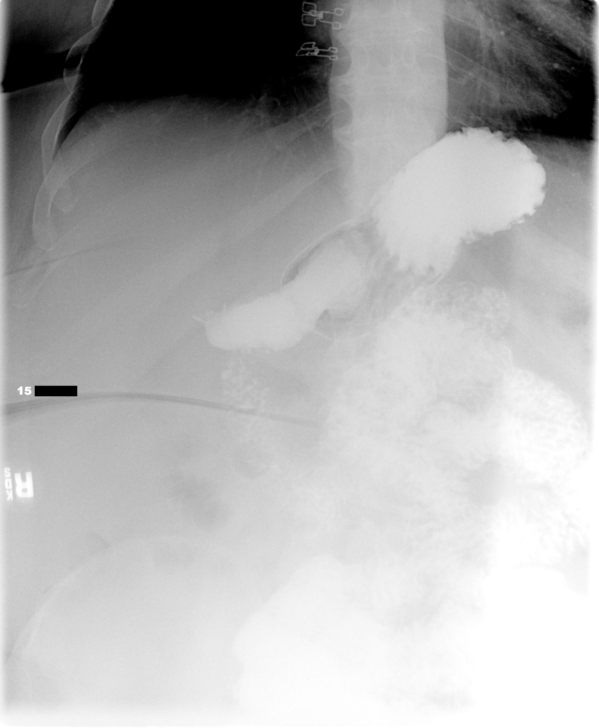

[view not recorded (3 of 6)]
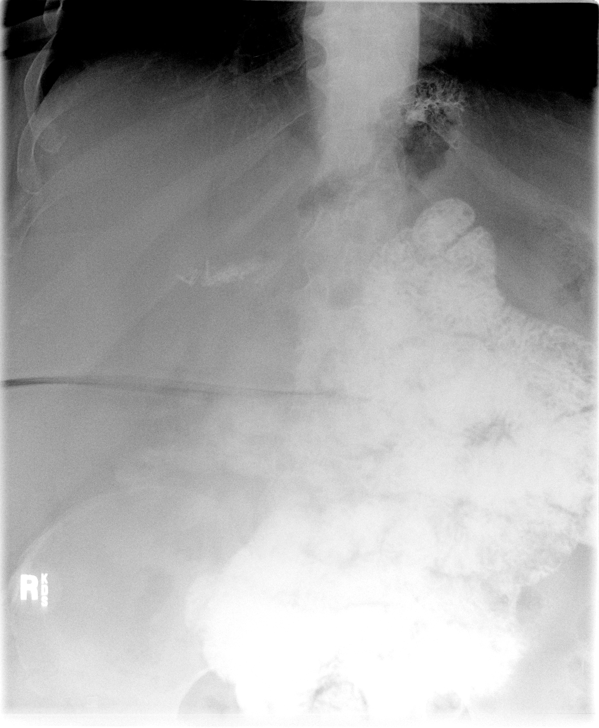

[view not recorded (4 of 6)]
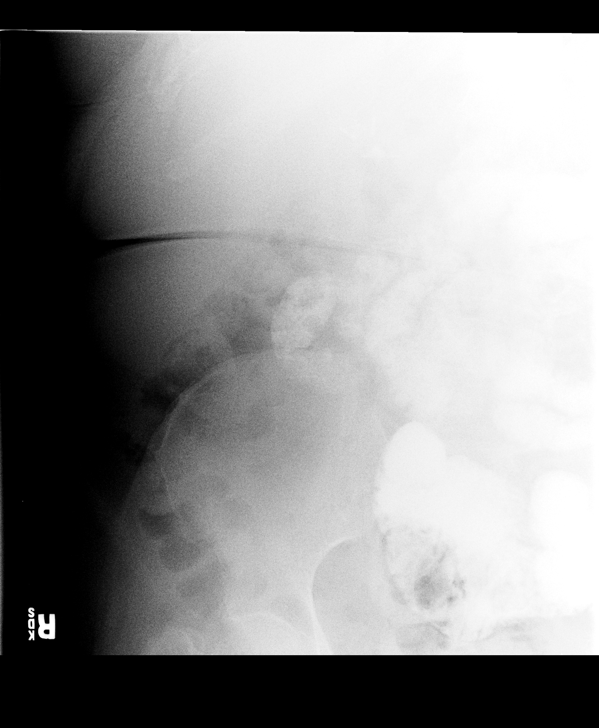

[view not recorded (5 of 6)]
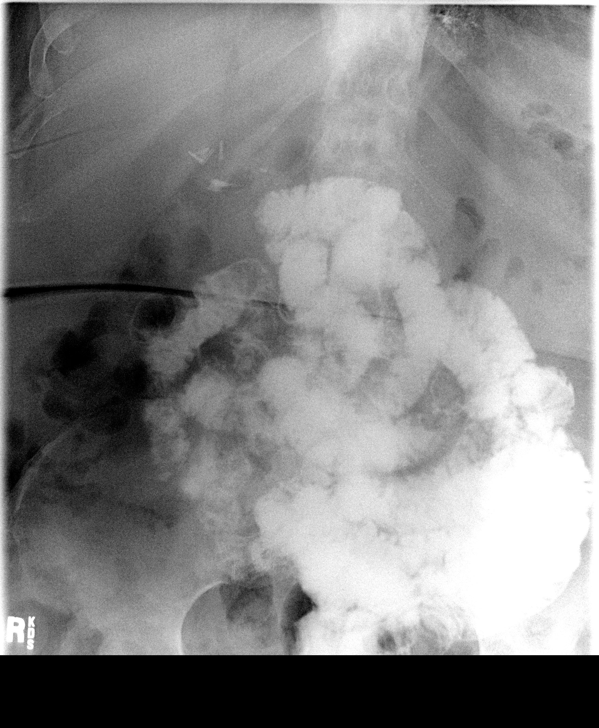

[view not recorded (6 of 6)]
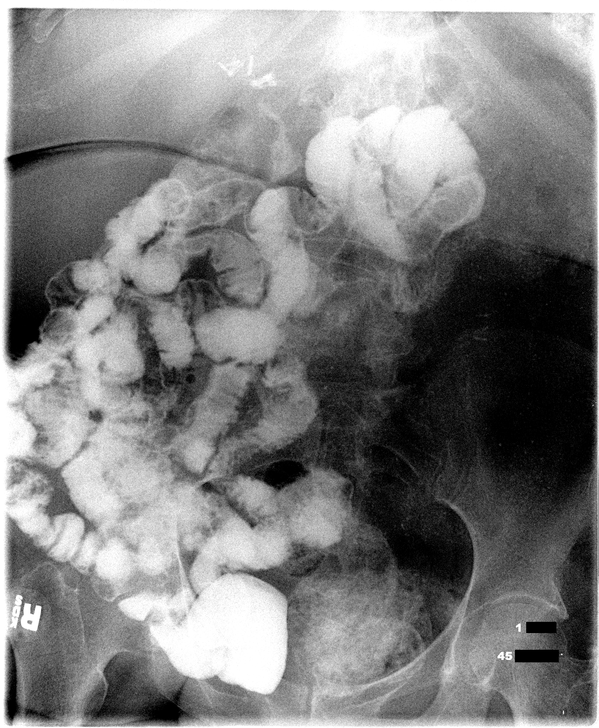

[11 of 11 positions shown; findings below may reference images not displayed]

FINDINGS: The visualized stomach and small bowel have a normal contour and fold pattern.  There is no evidence for stricture or mass.  The transit time to the colon is approximately 1 hour and 45 minutes.  Spot compression views are performed of the terminal ileum, showing normal appearance of the terminal ileum and proximal colon.
IMPRESSION: Normal small bowel follow through.

## 2009-06-30 ENCOUNTER — Encounter (INDEPENDENT_AMBULATORY_CARE_PROVIDER_SITE_OTHER): Payer: Self-pay | Admitting: *Deleted

## 2009-07-01 ENCOUNTER — Ambulatory Visit: Payer: Self-pay | Admitting: Cardiology

## 2009-07-01 ENCOUNTER — Encounter (INDEPENDENT_AMBULATORY_CARE_PROVIDER_SITE_OTHER): Payer: Self-pay | Admitting: *Deleted

## 2009-07-01 LAB — CONVERTED CEMR LAB: POC INR: 1.7

## 2009-07-02 ENCOUNTER — Ambulatory Visit: Payer: Self-pay | Admitting: Cardiology

## 2009-07-03 ENCOUNTER — Ambulatory Visit: Payer: Self-pay | Admitting: Cardiology

## 2009-07-15 ENCOUNTER — Emergency Department (HOSPITAL_COMMUNITY): Admission: EM | Admit: 2009-07-15 | Discharge: 2009-07-15 | Payer: Self-pay | Admitting: Emergency Medicine

## 2009-07-22 ENCOUNTER — Ambulatory Visit: Payer: Self-pay | Admitting: Cardiology

## 2009-07-22 LAB — CONVERTED CEMR LAB: POC INR: 2.2

## 2009-08-19 IMAGING — CR DG FOOT COMPLETE 3+V*R*
2 series · 2 of 2 positions shown · non-contrast
Comparison: none

CLINICAL DATA: Pain in the 2nd and 3rd toes.  
 RIGHT FOOT ? 3 VIEW:

[view not recorded (1 of 2)]
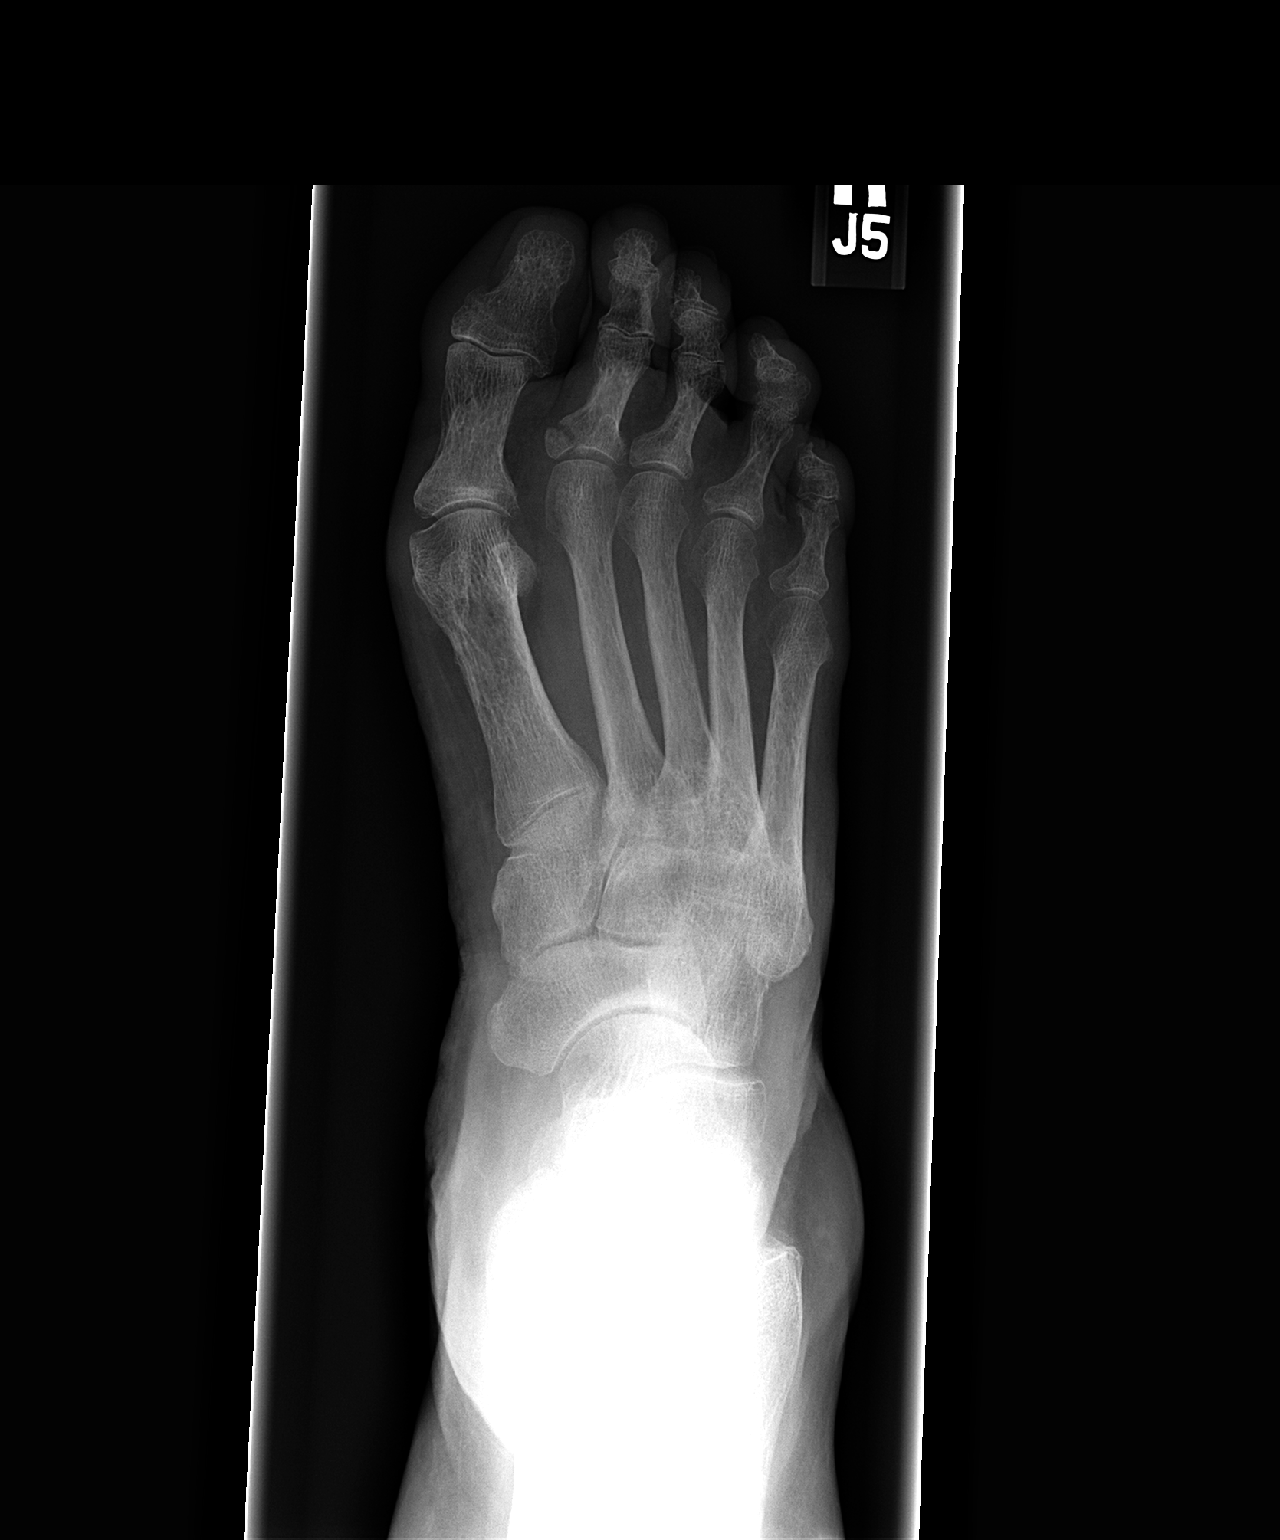

[view not recorded (2 of 2)]
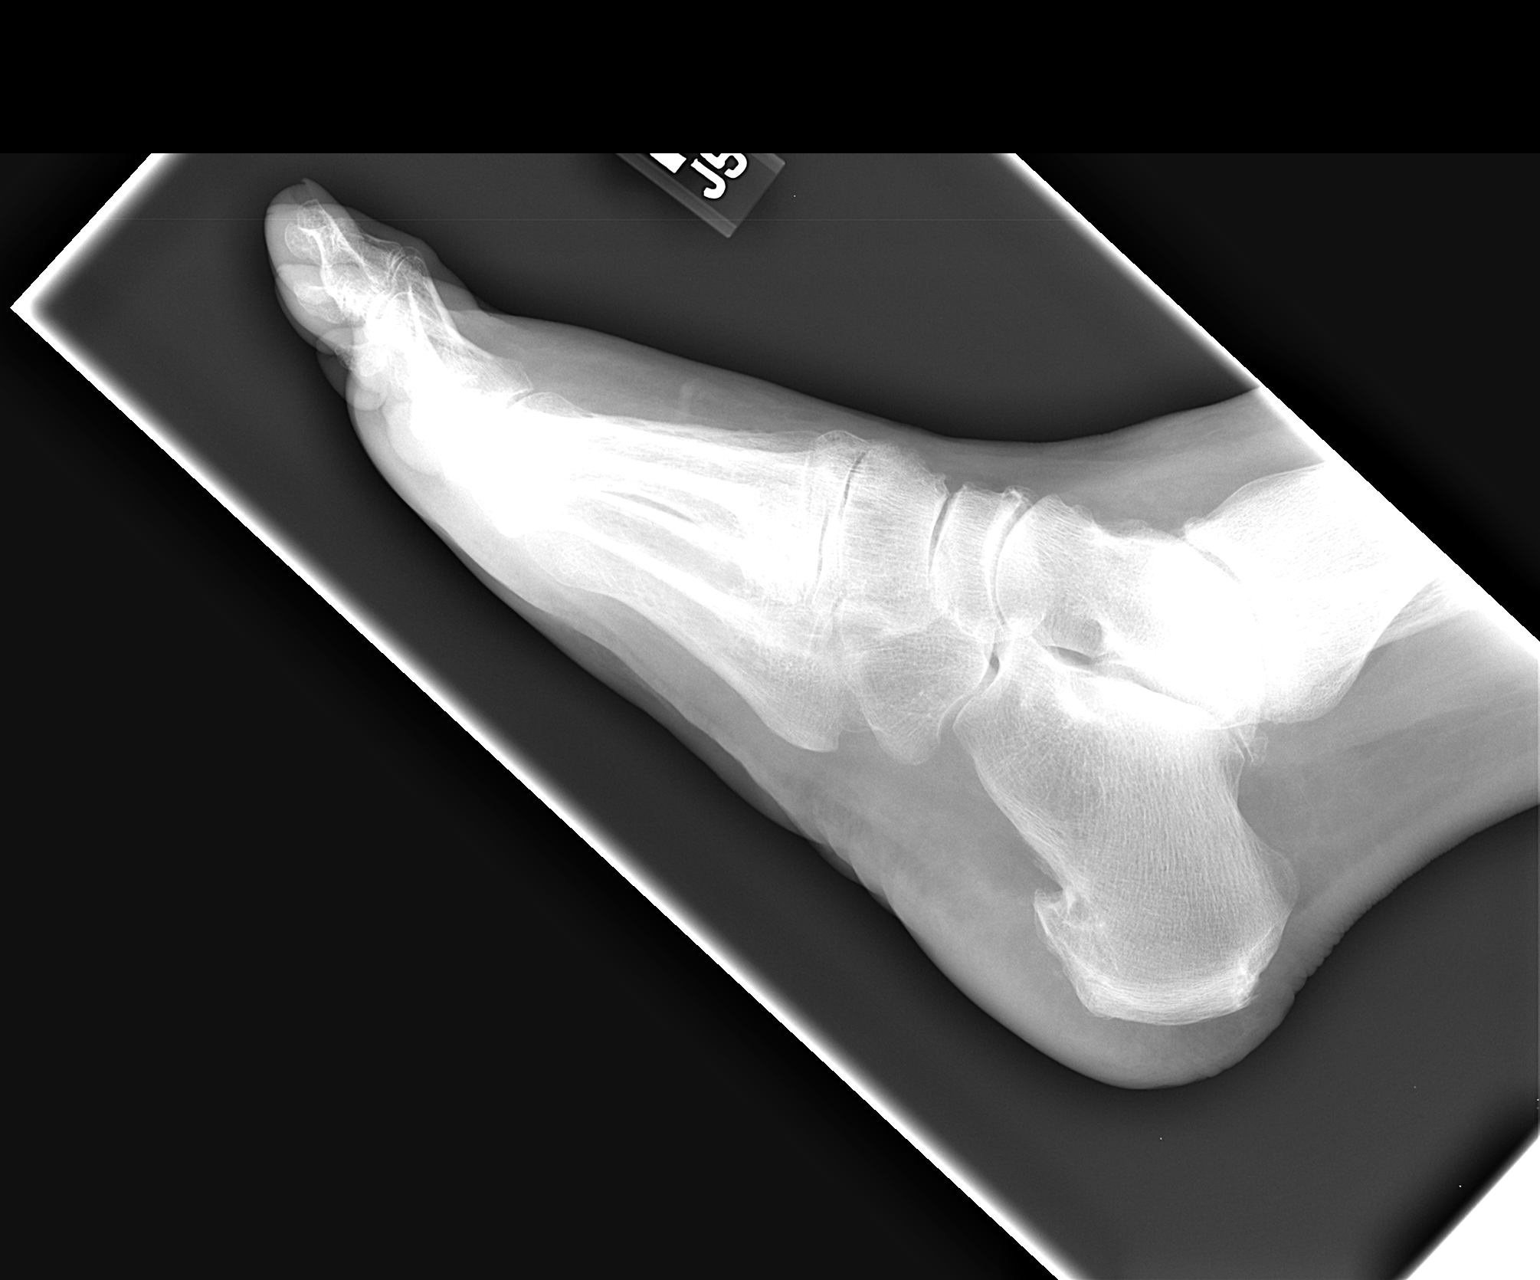

[2 of 2 positions shown; findings below may reference images not displayed]

FINDINGS: There is a fracture of the proximal medial corner of the proximal phalanx of the 2nd toe.  The fragment is separated by 1 mm to 1.5 mm. I don?t see any other fractures.  The patient does have hallux valgus deformity.
IMPRESSION: Fracture of the proximal medial corner of the proximal phalanx of the second toe.

## 2009-08-20 ENCOUNTER — Encounter (INDEPENDENT_AMBULATORY_CARE_PROVIDER_SITE_OTHER): Payer: Self-pay | Admitting: *Deleted

## 2009-08-20 ENCOUNTER — Ambulatory Visit: Payer: Self-pay | Admitting: Cardiology

## 2009-08-20 LAB — CONVERTED CEMR LAB
Alkaline Phosphatase: 55 units/L
BUN: 15 mg/dL
Bilirubin, Direct: 0.1 mg/dL
CO2: 19 meq/L
Creatinine, Ser: 0.78 mg/dL
Glucose, Bld: 236 mg/dL
POC INR: 2.5
Total Protein: 6.3 g/dL

## 2009-08-24 ENCOUNTER — Ambulatory Visit: Admission: AD | Admit: 2009-08-24 | Discharge: 2009-08-24 | Payer: Self-pay | Admitting: Family Medicine

## 2009-08-26 ENCOUNTER — Encounter (INDEPENDENT_AMBULATORY_CARE_PROVIDER_SITE_OTHER): Payer: Self-pay | Admitting: *Deleted

## 2009-08-26 ENCOUNTER — Ambulatory Visit: Payer: Self-pay | Admitting: Cardiology

## 2009-08-26 DIAGNOSIS — I951 Orthostatic hypotension: Secondary | ICD-10-CM

## 2009-09-16 ENCOUNTER — Ambulatory Visit: Payer: Self-pay | Admitting: Cardiology

## 2009-09-16 LAB — CONVERTED CEMR LAB: POC INR: 2.6

## 2009-09-18 ENCOUNTER — Ambulatory Visit (HOSPITAL_COMMUNITY): Admission: RE | Admit: 2009-09-18 | Discharge: 2009-09-18 | Payer: Self-pay | Admitting: Family Medicine

## 2009-10-15 ENCOUNTER — Ambulatory Visit: Payer: Self-pay | Admitting: Cardiology

## 2009-10-15 LAB — CONVERTED CEMR LAB: POC INR: 2.2

## 2009-11-12 ENCOUNTER — Ambulatory Visit: Payer: Self-pay | Admitting: Cardiology

## 2009-11-12 LAB — CONVERTED CEMR LAB: POC INR: 2.2

## 2009-11-23 ENCOUNTER — Encounter (INDEPENDENT_AMBULATORY_CARE_PROVIDER_SITE_OTHER): Payer: Self-pay

## 2009-12-10 ENCOUNTER — Ambulatory Visit: Payer: Self-pay | Admitting: Cardiology

## 2009-12-10 LAB — CONVERTED CEMR LAB: POC INR: 1.3

## 2009-12-16 ENCOUNTER — Ambulatory Visit: Payer: Self-pay | Admitting: Cardiology

## 2009-12-16 LAB — CONVERTED CEMR LAB: POC INR: 1.8

## 2009-12-28 ENCOUNTER — Ambulatory Visit: Payer: Self-pay | Admitting: Cardiology

## 2009-12-28 LAB — CONVERTED CEMR LAB: POC INR: 2

## 2010-01-06 ENCOUNTER — Encounter (INDEPENDENT_AMBULATORY_CARE_PROVIDER_SITE_OTHER): Payer: Self-pay | Admitting: *Deleted

## 2010-01-06 LAB — CONVERTED CEMR LAB
ALT: 13 units/L (ref 0–35)
Albumin: 4.2 g/dL (ref 3.5–5.2)
CO2: 21 meq/L (ref 19–32)
Calcium: 8.9 mg/dL (ref 8.4–10.5)
Chloride: 100 meq/L (ref 96–112)
Creatinine, Ser: 0.87 mg/dL (ref 0.40–1.20)
Eosinophils Absolute: 0.2 10*3/uL (ref 0.0–0.7)
Lymphs Abs: 3.1 10*3/uL (ref 0.7–4.0)
MCV: 84.3 fL (ref 78.0–100.0)
Monocytes Relative: 9 % (ref 3–12)
Neutrophils Relative %: 51 % (ref 43–77)
Potassium: 4.4 meq/L (ref 3.5–5.3)
RBC: 4.6 M/uL (ref 3.87–5.11)
Sodium: 136 meq/L (ref 135–145)
Total Protein: 6.9 g/dL (ref 6.0–8.3)
WBC: 8.5 10*3/uL (ref 4.0–10.5)

## 2010-01-20 ENCOUNTER — Ambulatory Visit: Payer: Self-pay | Admitting: Cardiology

## 2010-01-20 LAB — CONVERTED CEMR LAB: POC INR: 2.9

## 2010-02-02 ENCOUNTER — Ambulatory Visit (HOSPITAL_COMMUNITY): Admission: RE | Admit: 2010-02-02 | Discharge: 2010-02-02 | Payer: Self-pay | Admitting: Family Medicine

## 2010-02-17 ENCOUNTER — Ambulatory Visit: Payer: Self-pay | Admitting: Cardiology

## 2010-02-23 ENCOUNTER — Encounter: Admission: RE | Admit: 2010-02-23 | Discharge: 2010-02-23 | Payer: Self-pay | Admitting: Family Medicine

## 2010-03-01 ENCOUNTER — Ambulatory Visit: Payer: Self-pay | Admitting: Cardiology

## 2010-03-22 ENCOUNTER — Ambulatory Visit: Payer: Self-pay | Admitting: Cardiology

## 2010-03-22 ENCOUNTER — Encounter: Admission: RE | Admit: 2010-03-22 | Discharge: 2010-03-22 | Payer: Self-pay | Admitting: Family Medicine

## 2010-04-01 ENCOUNTER — Ambulatory Visit: Payer: Self-pay | Admitting: Cardiology

## 2010-04-21 IMAGING — RF DG UGI W/ KUB
16 of 24 series · 16 of 24 positions shown · non-contrast
Comparison: Small bowel follow through exam 01/24/2007

CLINICAL DATA: History of reflux and hiatal hernia.  History of
dilatations of the esophagus.  Screening for bariatric surgery.

UPPER GI SERIES W/ KUB
TECHNIQUE: After obtaining a scout radiograph a single-column
upper GI series was performed using thin barium.

[Series 1: run · 1 of 1 slices shown (1 of 14)]
[im 1/1]
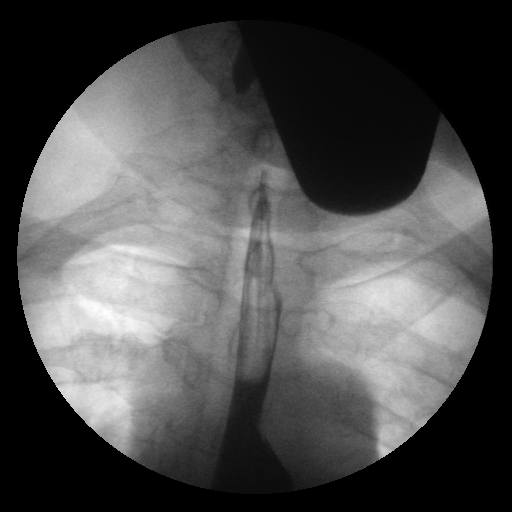

[Series 3: run · 1 of 1 slices shown (2 of 14)]
[im 1/1]
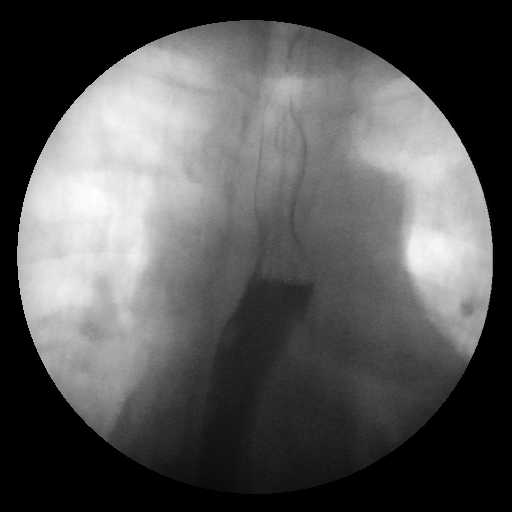

[Series 4: run · 1 of 1 slices shown (3 of 14)]
[im 1/1]
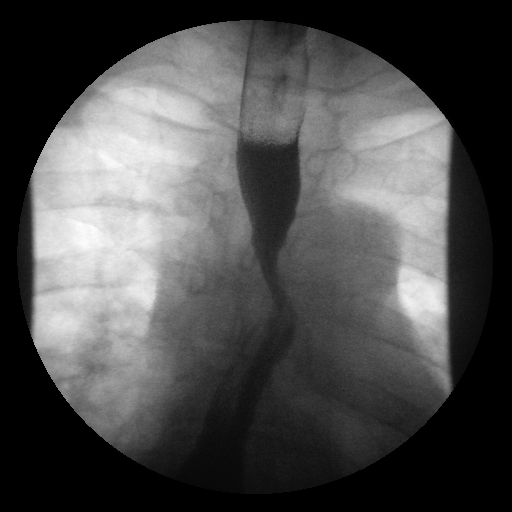

[Series 6: run · 1 of 1 slices shown (4 of 14)]
[im 1/1]
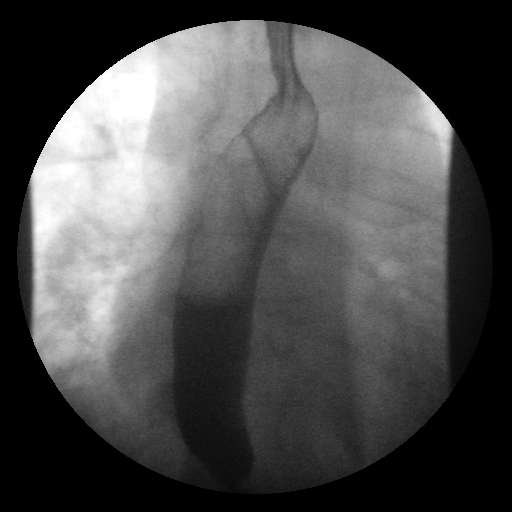

[Series 7: run · 1 of 1 slices shown (5 of 14)]
[im 1/1]
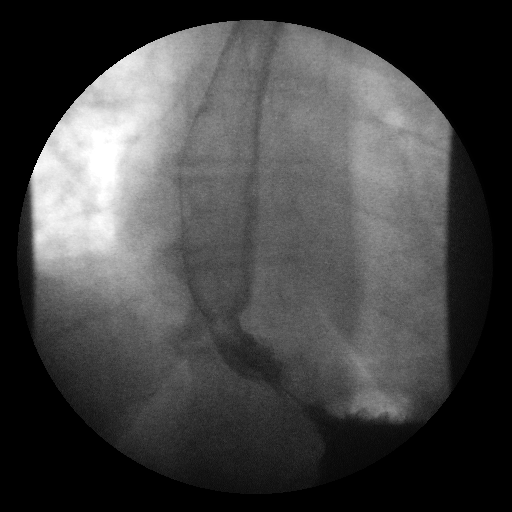

[Series 9: run · 1 of 1 slices shown (6 of 14)]
[im 1/1]
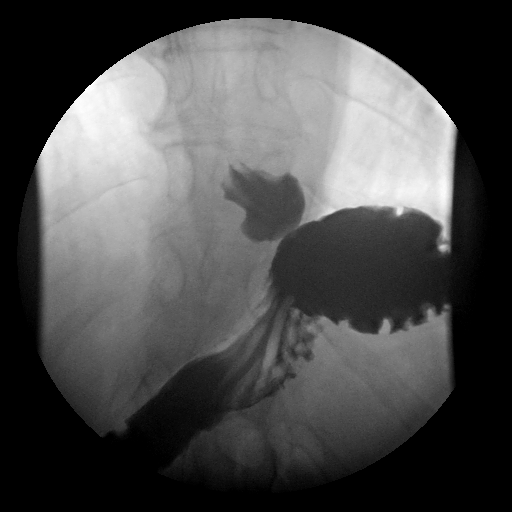

[Series 10: run · 1 of 1 slices shown (7 of 14)]
[im 1/1]
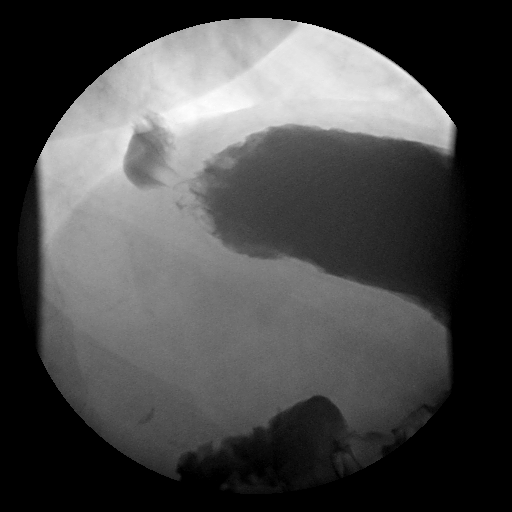

[Series 12: run · 1 of 1 slices shown (8 of 14)]
[im 1/1]
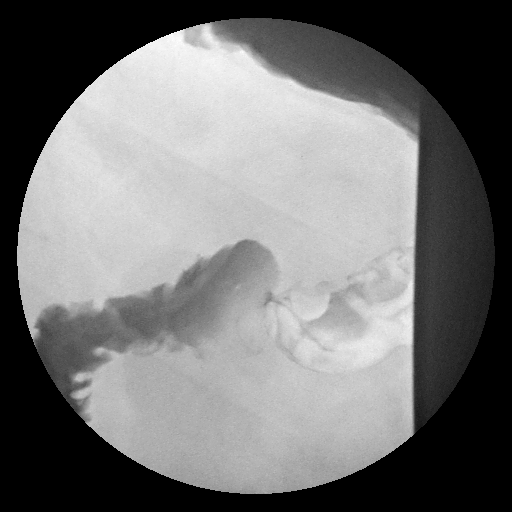

[Series 13: run · 1 of 1 slices shown (9 of 14)]
[im 1/1]
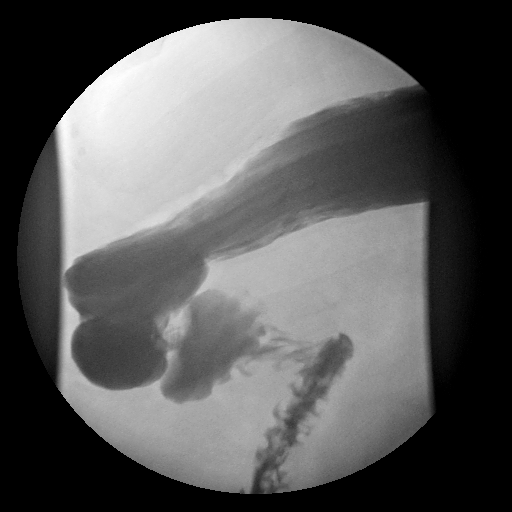

[Series 15: run · 1 of 1 slices shown (10 of 14)]
[im 1/1]
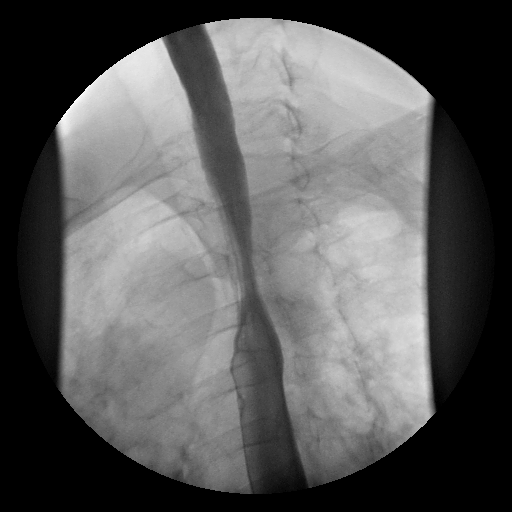

[Series 16: run · 1 of 1 slices shown (11 of 14)]
[im 1/1]
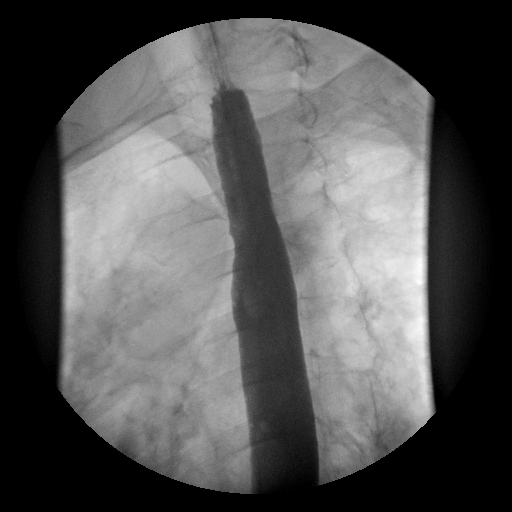

[Series 18: run · 1 of 1 slices shown (12 of 14)]
[im 1/1]
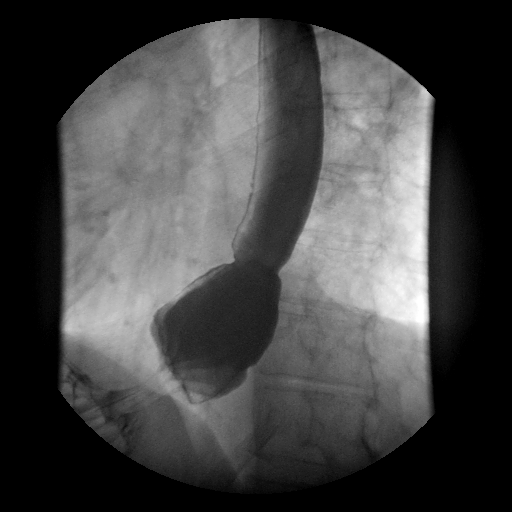

[Series 19: run · 1 of 1 slices shown (13 of 14)]
[im 1/1]
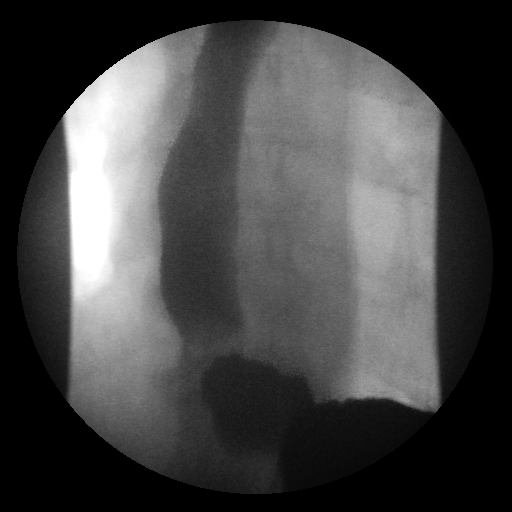

[Series 21: run · 1 of 1 slices shown (14 of 14)]
[im 1/1]
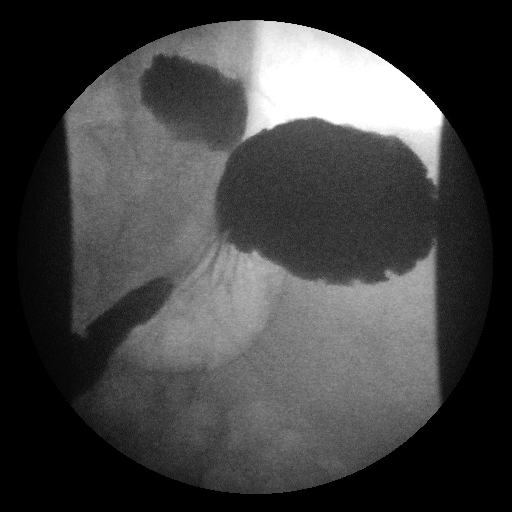

[Series 1001: view not recorded · 0.20mm/px · 1 of 1 slices shown (1 of 2)]
[im 1/1]
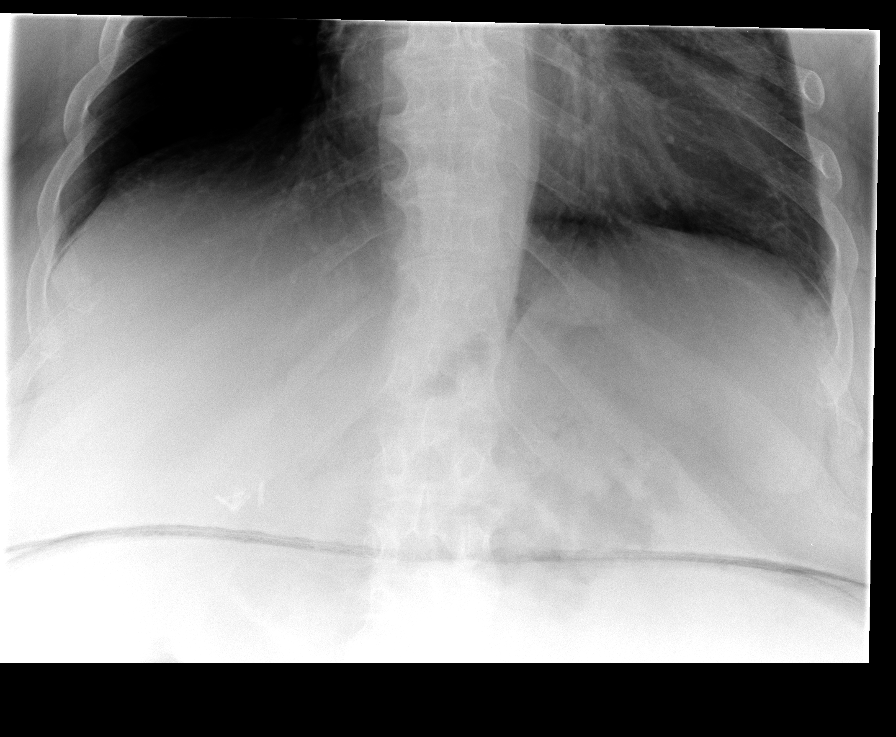

[Series 1003: view not recorded · 0.20mm/px · 1 of 1 slices shown (2 of 2)]
[im 1/1]
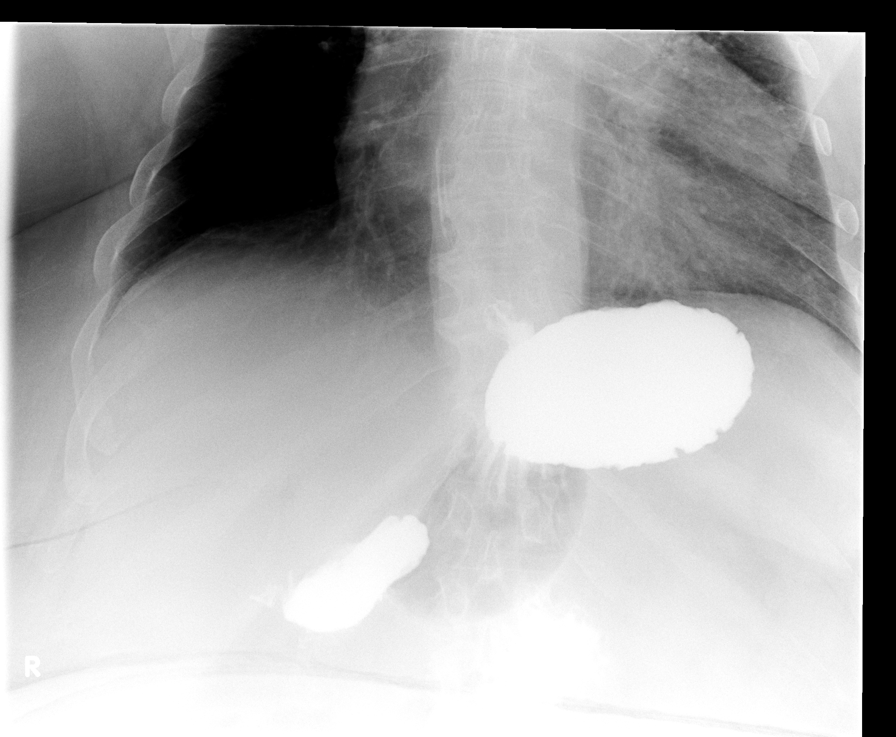

[16 of 24 positions shown; findings below may reference images not displayed]

FINDINGS: A scout view of the abdomen and pelvis shows a nonobstructive
bowel gas pattern.  Cholecystectomy clips are noted in the right
upper quadrant.

The esophagus is of normal contour and distensibility. No mass or
stricture is identified.

Esophageal peristalsis is somewhat disorganized, with proximal
migration of the barium column on several swallows.

There is a small to moderate sliding type hiatal hernia.

When the patient rolled over on the table, a spontaneous episode of
gastroesophageal reflux occurred, to the level of the upper
esophagus. The patient reports that she was symptomatic during this
episode of reflux.

The duodenum is unremarkable on this single contrast study.
IMPRESSION: 1. Small to moderate-sized sliding type hiatal hernia.
2.  Gastroesophageal reflux identified.
3.  Nonspecific esophageal dysmotility.

## 2010-04-22 ENCOUNTER — Ambulatory Visit: Payer: Self-pay | Admitting: Cardiology

## 2010-05-24 ENCOUNTER — Ambulatory Visit: Payer: Self-pay | Admitting: Cardiology

## 2010-06-21 ENCOUNTER — Ambulatory Visit: Payer: Self-pay | Admitting: Cardiology

## 2010-06-21 LAB — CONVERTED CEMR LAB: POC INR: 2.7

## 2010-07-21 ENCOUNTER — Ambulatory Visit: Admission: RE | Admit: 2010-07-21 | Discharge: 2010-07-21 | Payer: Self-pay | Source: Home / Self Care

## 2010-07-21 LAB — CONVERTED CEMR LAB: POC INR: 2.4

## 2010-07-25 ENCOUNTER — Encounter: Payer: Self-pay | Admitting: Family Medicine

## 2010-08-03 NOTE — Medication Information (Signed)
Summary: ccr-lr  Anticoagulant Therapy  Managed by: Tara Hey, RN PCP: Katharine Look M.D. Supervising MD: Dietrich Pates MD, Molly Maduro Indication 1: Atrial Fibrillation Lab Used: [facility] [place] Site: [place] INR POC 1.7  Dietary changes: no    Health status changes: no    Bleeding/hemorrhagic complications: no    Recent/future hospitalizations: no    Any changes in medication regimen? no    Recent/future dental: no  Any missed doses?: no       Is patient compliant with meds? yes       Allergies: 1)  ! Codeine  Anticoagulation Management History:      The patient is taking warfarin and comes in today for a routine follow up visit.  Positive risk factors for bleeding include presence of serious comorbidities.  Negative risk factors for bleeding include an age less than 19 years old.  The bleeding index is 'intermediate risk'.  Positive CHADS2 values include History of CHF, History of HTN, and History of Diabetes.  Negative CHADS2 values include Age > 19 years old.  The start date was 03/27/2008.  Her last INR was 1.3.  Anticoagulation responsible provider: Dietrich Pates MD, Molly Maduro.  INR POC: 1.7.  Cuvette Lot#: [id number].  Exp: 10/11.    Anticoagulation Management Assessment/Plan:      The patient's current anticoagulation dose is Coumadin 5 mg tabs: once daily.  The target INR is 2 - 3.  The next INR is due 04/22/2010.  Anticoagulation instructions were given to patient.  Results were reviewed/authorized by Tara Hey, RN.  She was notified by Tara Hey RN.         Prior Anticoagulation Instructions: INR 1.1 Scheduled for 2nd neck injection today Take coumadin 5mg  tonight and tomorrow night then resume 2.5mg  once daily except 5mg  on Wednesdays  Current Anticoagulation Instructions: INR 1.7 Take coumadin 1 tablet tonight then resume 1/2 tablet once daily except 1 tablet on Wednesdays

## 2010-08-03 NOTE — Procedures (Signed)
Summary: Holter and Event  Holter and Event   Imported By: Faythe Ghee 08/27/2009 09:18:32  _____________________________________________________________________  External Attachment:    Type:   Image     Comment:   External Document

## 2010-08-03 NOTE — Medication Information (Signed)
Summary: ccr-lr  Anticoagulant Therapy  Managed by: Vashti Hey, RN PCP: Katharine Look M.D. Supervising MD: Dietrich Pates MD, Molly Maduro Indication 1: Atrial Fibrillation Lab Used: [facility] [place] Site: [name] INR POC 2.2  Dietary changes: no    Health status changes: no    Bleeding/hemorrhagic complications: no    Recent/future hospitalizations: no    Any changes in medication regimen? no    Recent/future dental: no  Any missed doses?: no       Is patient compliant with meds? yes       Allergies: 1)  ! Codeine  Anticoagulation Management History:      The patient is taking warfarin and comes in today for a routine follow up visit.  Positive risk factors for bleeding include presence of serious comorbidities.  Negative risk factors for bleeding include an age less than 4 years old.  The bleeding index is 'intermediate risk'.  Positive CHADS2 values include History of CHF, History of HTN, and History of Diabetes.  Negative CHADS2 values include Age > 71 years old.  The start date was 03/27/2008.  Her last INR was 1.3.  Anticoagulation responsible provider: Dietrich Pates MD, Molly Maduro.  INR POC: 2.2.  Exp: 10/11.    Anticoagulation Management Assessment/Plan:      The patient's current anticoagulation dose is Coumadin 5 mg tabs: once daily.  The target INR is 2 - 3.  The next INR is due 05/20/2010.  Anticoagulation instructions were given to patient.  Results were reviewed/authorized by Vashti Hey, RN.  She was notified by Vashti Hey RN.         Prior Anticoagulation Instructions: INR 1.7 Take coumadin 1 tablet tonight then resume 1/2 tablet once daily except 1 tablet on Wednesdays  Current Anticoagulation Instructions: INR 2.2 Continue coumadin 2.5mg  once daily except 5mg  on Wednesdays

## 2010-08-03 NOTE — Assessment & Plan Note (Signed)
Summary: PER DR.KNOWLTON FOR SYNCOPE/TG   Visit Type:  Follow-up Primary Provider:  Katharine Look M.D.  CC:  syncope.  History of Present Illness: [name] return to the office for continued assessment and treatment of syncope.  Since her last visit, she suffered a fall in the kitchen with head trauma and an emergency department visit that she claims was not associated with loss of consciousness.  On another occasion, she clearly tripped over a rug and fell.  A third spell followed a restaurant meal.  As she was walking to the car, she became lightheaded and suffered brief or near syncope.  She was supported to the ground by her daughters where she immediately regained normal consciousness.  Current Medications (verified): 1)  Omeprazole 20 Mg Tbec (Omeprazole) .... Two Times A Day 2)  Lantus 100 Unit/ml Soln (Insulin Glargine) .... Take 30 Units At Bedtime 3)  Glipizide 10 Mg Tabs (Glipizide) .... Once Daily 4)  Insulin Humulin R .... As Directed 5)  Synthroid 125 Mcg Tabs (Levothyroxine Sodium) .... Take 1 Tab Daily 6)  Nitrostat 0.4 Mg Subl (Nitroglycerin) .... As Needed 7)  Zofran 4 Mg Tabs (Ondansetron Hcl) .... Once Daily 8)  Coumadin 5 Mg Tabs (Warfarin Sodium) .... Once Daily 9)  Metformin Hcl 1000 Mg Tabs (Metformin Hcl) .... Two Times A Day 10)  Skelaxin 800 Mg Tabs (Metaxalone) .... 3 Times Daily 11)  Ativan 1 Mg Tabs (Lorazepam) .... 3 Times Daily 12)  Citalopram Hydrobromide 20 Mg Tabs (Citalopram Hydrobromide) .... Take 1 Tab Daily 13)  Reglan 10 Mg Tabs (Metoclopramide Hcl) .... Ac and Hs 14)  Simvastatin 20 Mg Tabs (Simvastatin) .... Take 1 Tab Daily 15)  Meclizine Hcl 25 Mg Tabs (Meclizine Hcl) .... Take As Needed 16)  Furosemide 20 Mg Tabs (Furosemide) .... Take One Tablet By Mouth Daily As Needed Edema  Allergies (verified): 1)  ! Codeine  Past History:  PMH, FH, and Social History reviewed and updated.  Review of Systems       The patient complains  of syncope.  The patient denies weight loss, weight gain, chest pain, dyspnea on exertion, peripheral edema, prolonged cough, headaches, and abdominal pain.         She has remained relatively in active, stayed around the house most days.  She has had no edema and no hypertension.  She is virtually stopped using the diuretic.  Vital Signs:  Patient profile:   60 year old female Weight:      252 pounds Pulse rate:   85 / minute Pulse (ortho):   112 / minute BP sitting:   120 / 81  (right arm) BP standing:   126 / 85  Vitals Entered By: Dreama Saa, CNA (August 26, 2009 3:02 PM)  Serial Vital Signs/Assessments:  Time      Position  BP       Pulse  Resp  Temp     By           Lying LA  140/87   [address] CNA           Sitting   134/81   [address], CNA           Standing  126/85   112  Dreama Saa, CNA   Physical Exam  General:   General-Well developed; no acute distress; overweight   Neck-No JVD; no carotid bruits: Lungs-Crystal clear: Cardiovascular-normal PMI; normal S1 and S2; grade 2/6 early systolic ejection murmur Abdomen-BS normal; soft and non-tender without masses or organomegaly:  Musculoskeletal-No deformities, no cyanosis or clubbing: Neurologic-Normal cranial nerves; symmetric strength and tone:  Skin-Warm, no significant lesions: Extremities-Nl distal pulses; no edema:     Impression & Recommendations:  Problem # 1:  HYPOTENSION, ORTHOSTATIC (ICD-458.0) Although Ms. Kobayashi's orthostatic blood pressures are markedly improved, there is still a significant decrease when she stands.  Although standing blood pressure is adequate  today, on other days, perhaps when she is slightly dehydrated or just after a meal, the decrease may be significant enough to cause symptoms.  I have asked her to increase salt and fluid intake.  She will wear compression stockings to the mid thigh.  I instructed her in  isometric exercise that she could use to abort frank loss of consciousnes.  The impression when she was hospitalized was that her antidepressant was contributing to standing hypotension.  She is currently treated with Celexa, which does not have this adverse effect.  Metoclopramide might rarely resulted in reduced blood pressure, but I doubt that her current problems are pharmacologic.  In the face of rare episodes and a history of hypertension, I'm not inclined to add ProAmatine, which would likely result in supine hypertension.  I will add fludrocortisone if symptoms persist.  By history, her symptoms appear clearly related to change in body position.  I doubt that arrhythmia could be causing these problems.  Her 21 day event recorder showed no abnormalities.    She will call for recurrent episodes.  Otherwise, I will plan office reassessment in 2 months.  Other Orders: Durable Medical Equipment (DME)  Patient Instructions: 1)  Your physician recommends that you schedule a follow-up appointment in:  2 months 2)  INCREASE FLUID AND SALT INTAKE 3)  ISOMETRIC EXERCISE WHEN DIZZY

## 2010-08-03 NOTE — Letter (Signed)
Summary: Recall Colonoscopy/Endoscopy, Change to Office Visit  [facility]  [address]   [place], [place] [address]   Phone: [phone]  Fax: [phone]      Nov 23, 2009   [name] [address] [place], [place]  [address] [birth year]   Dear [name],   According to our records, it is time for you to schedule an Endoscopy. However, after reviewing your medical record, we recommend an office visit in order to determine your need for a repeat procedure.  Please call [phone] at your convenience to schedule an office visit. If you have any questions or concerns, please feel free to contact our office.   Sincerely,   Cloria Spring LPN  [facility] Ph: [phone]   Fax: [phone]

## 2010-08-03 NOTE — Medication Information (Signed)
Summary: ccr-lr  Anticoagulant Therapy  Managed by: Vashti Hey, RN PCP: Katharine Look M.D. Supervising MD: Dietrich Pates MD, Molly Maduro Indication 1: Atrial Fibrillation Lab Used: [facility] [place] Site: [place] INR POC 2.2  Dietary changes: no    Health status changes: no    Bleeding/hemorrhagic complications: no    Recent/future hospitalizations: no    Any changes in medication regimen? no    Recent/future dental: no  Any missed doses?: no       Is patient compliant with meds? yes       Allergies: 1)  ! Codeine  Anticoagulation Management History:      The patient is taking warfarin and comes in today for a routine follow up visit.  Positive risk factors for bleeding include presence of serious comorbidities.  Negative risk factors for bleeding include an age less than 72 years old.  The bleeding index is 'intermediate risk'.  Positive CHADS2 values include History of CHF, History of HTN, and History of Diabetes.  Negative CHADS2 values include Age > 83 years old.  The start date was 03/27/2008.  Her last INR was 1.3.  Anticoagulation responsible provider: Dietrich Pates MD, Molly Maduro.  INR POC: 2.2.  Cuvette Lot#: [id number].  Exp: 10/11.    Anticoagulation Management Assessment/Plan:      The patient's current anticoagulation dose is Coumadin 5 mg tabs: once daily.  The target INR is 2 - 3.  The next INR is due 12/10/2009.  Anticoagulation instructions were given to patient.  Results were reviewed/authorized by Vashti Hey, RN.  She was notified by Vashti Hey RN.         Prior Anticoagulation Instructions: INR 2.2 Continue coumadin 2.5mg  once daily   Current Anticoagulation Instructions: Same as Prior Instructions.

## 2010-08-03 NOTE — Medication Information (Signed)
Summary: ccr-at RR appt-lr  Anticoagulant Therapy  Managed by: Tara Hey, RN PCP: [name] M.D. Supervising MD: Tara Pates MD, [name] Indication 1: Atrial Fibrillation Lab Used: [facility] [place] Site: [place] INR POC 2.0  Dietary changes: no    Health status changes: no    Bleeding/hemorrhagic complications: no    Recent/future hospitalizations: no    Any changes in medication regimen? no    Recent/future dental: no  Any missed doses?: no       Is patient compliant with meds? yes       Allergies: 1)  ! Codeine  Anticoagulation Management History:      The patient is taking warfarin and comes in today for a routine follow up visit.  Positive risk factors for bleeding include presence of serious comorbidities.  Negative risk factors for bleeding include an age less than 39 years old.  The bleeding index is 'intermediate risk'.  Positive CHADS2 values include History of CHF, History of HTN, and History of Diabetes.  Negative CHADS2 values include Age > 62 years old.  The start date was 03/27/2008.  Her last INR was 1.3.  Anticoagulation responsible provider: Dietrich Pates MD, [name].  INR POC: 2.0.  Cuvette Lot#: [id number].  Exp: 10/11.    Anticoagulation Management Assessment/Plan:      The patient's current anticoagulation dose is Coumadin 5 mg tabs: once daily.  The target INR is 2 - 3.  The next INR is due 01/20/2010.  Anticoagulation instructions were given to patient.  Results were reviewed/authorized by Tara Hey, RN.  She was notified by Tara Hey RN.         Prior Anticoagulation Instructions: INR 1.8 Increase coumadin to 2.5mg  once daily except 5mg  on Wednesdays  Current Anticoagulation Instructions: INR 2.0 Continue coumadin 2.5mg  once daily except 5mg  on Wednesdays

## 2010-08-03 NOTE — Medication Information (Signed)
Summary: ccr-lr  Anticoagulant Therapy  Managed by: Vashti Hey, RN PCP: Katharine Look M.D. Supervising MD: Dietrich Pates MD, Molly Maduro Indication 1: Atrial Fibrillation Lab Used: [facility] [place] Site: [place] INR POC 1.8  Dietary changes: no    Health status changes: no    Bleeding/hemorrhagic complications: no    Recent/future hospitalizations: yes       Details: Had injection in neck 8/23  Any changes in medication regimen? no    Recent/future dental: no  Any missed doses?: yes     Details: was off coumadin 4 days  Is patient compliant with meds? yes       Allergies: 1)  ! Codeine  Anticoagulation Management History:      The patient is taking warfarin and comes in today for a routine follow up visit.  Positive risk factors for bleeding include presence of serious comorbidities.  Negative risk factors for bleeding include an age less than 71 years old.  The bleeding index is 'intermediate risk'.  Positive CHADS2 values include History of CHF, History of HTN, and History of Diabetes.  Negative CHADS2 values include Age > 14 years old.  The start date was 03/27/2008.  Her last INR was 1.3.  Anticoagulation responsible provider: Dietrich Pates MD, Molly Maduro.  INR POC: 1.8.  Cuvette Lot#: [id number].  Exp: 10/11.    Anticoagulation Management Assessment/Plan:      The patient's current anticoagulation dose is Coumadin 5 mg tabs: once daily.  The target INR is 2 - 3.  The next INR is due 03/22/2010.  Anticoagulation instructions were given to patient.  Results were reviewed/authorized by Vashti Hey, RN.  She was notified by Vashti Hey RN.         Prior Anticoagulation Instructions: INR 2.9 Continue coumadin 2.5mg  once daily except 5mg  on Wednesdays Scheduled for injection in neck on 8/23.  They told her to hold coumadin 4 days before procedure (02/18/10) and resume coumadin ASAP after procedure.  She will come by [facility]. 02/22/10 to check INR before procedure.  Needs to  be below 1.5. F/U INR after procedure scheduled for 03/01/10  Current Anticoagulation Instructions: INR 1.8 Take coumadin 5mg  tonight then resume 2.5mg  once daily except 5mg  on Wednesdays Scheduled for 2nd injection in neck 03/22/10 Will come for INR check that am before procedure

## 2010-08-03 NOTE — Medication Information (Signed)
Summary: ccr-lr  Anticoagulant Therapy  Managed by: Vashti Hey, RN PCP: Katharine Look M.D. Supervising MD: Daleen Squibb MD, Maisie Fus Indication 1: Atrial Fibrillation Lab Used: [facility] [place] Site: [place] INR POC 2.5  Dietary changes: no    Health status changes: no    Bleeding/hemorrhagic complications: no    Recent/future hospitalizations: no    Any changes in medication regimen? no    Recent/future dental: no  Any missed doses?: no       Is patient compliant with meds? yes       Allergies: 1)  ! Codeine  Anticoagulation Management History:      The patient is taking warfarin and comes in today for a routine follow up visit.  Positive risk factors for bleeding include presence of serious comorbidities.  Negative risk factors for bleeding include an age less than 2 years old.  The bleeding index is 'intermediate risk'.  Positive CHADS2 values include History of CHF, History of HTN, and History of Diabetes.  Negative CHADS2 values include Age > 65 years old.  The start date was 03/27/2008.  Her last INR was 1.3.  Anticoagulation responsible provider: Daleen Squibb MD, Maisie Fus.  INR POC: 2.5.  Cuvette Lot#: [id number].  Exp: 10/11.    Anticoagulation Management Assessment/Plan:      The patient's current anticoagulation dose is Coumadin 5 mg tabs: once daily.  The target INR is 2 - 3.  The next INR is due 09/16/2009.  Anticoagulation instructions were given to patient.  Results were reviewed/authorized by Vashti Hey, RN.  She was notified by Vashti Hey RN.         Prior Anticoagulation Instructions: INR 2.2 Continue coumadin 2.5mg  once daily   Current Anticoagulation Instructions: INR 2.5 Continue coumadin 2.5mg  once daily

## 2010-08-03 NOTE — Medication Information (Signed)
Summary: ccr-lr  Anticoagulant Therapy  Managed by: Vashti Hey, RN PCP: Katharine Look M.D. Supervising MD: Daleen Squibb MD, Maisie Fus Indication 1: Atrial Fibrillation Lab Used: [facility]  Site: [place] INR POC 2.9  Dietary changes: no    Health status changes: no    Bleeding/hemorrhagic complications: no    Recent/future hospitalizations: yes       Details: scheduled injections in neck on 02/23/10  Needs to be off coumadin 4days prior to procedure  Any changes in medication regimen? no    Recent/future dental: no  Any missed doses?: no       Is patient compliant with meds? yes       Allergies: 1)  ! Codeine  Anticoagulation Management History:      The patient is taking warfarin and comes in today for a routine follow up visit.  Positive risk factors for bleeding include presence of serious comorbidities.  Negative risk factors for bleeding include an age less than 83 years old.  The bleeding index is 'intermediate risk'.  Positive CHADS2 values include History of CHF, History of HTN, and History of Diabetes.  Negative CHADS2 values include Age > 69 years old.  The start date was 03/27/2008.  Her last INR was 1.3.  Anticoagulation responsible provider: Daleen Squibb MD, Maisie Fus.  INR POC: 2.9.  Cuvette Lot#: [id number].  Exp: 10/11.    Anticoagulation Management Assessment/Plan:      The patient's current anticoagulation dose is Coumadin 5 mg tabs: once daily.  The target INR is 2 - 3.  The next INR is due 03/01/2010.  Anticoagulation instructions were given to patient.  Results were reviewed/authorized by Vashti Hey, RN.  She was notified by Vashti Hey RN.         Prior Anticoagulation Instructions: INR 2.9 Continue coumadin 2.5mg  once daily except 5mg  on Wednesdays  Current Anticoagulation Instructions: INR 2.9 Continue coumadin 2.5mg  once daily except 5mg  on Wednesdays Scheduled for injection in neck on 8/23.  They told her to hold coumadin 4 days before  procedure (02/18/10) and resume coumadin ASAP after procedure.  She will come by [facility]. 02/22/10 to check INR before procedure.  Needs to be below 1.5. F/U INR after procedure scheduled for 03/01/10  Appended Document: ccr-lr Please review.

## 2010-08-03 NOTE — Medication Information (Signed)
Summary: ccr-lr  Anticoagulant Therapy  Managed by: Vashti Hey, RN PCP: Katharine Look M.D. Supervising MD: Daleen Squibb MD, Maisie Fus Indication 1: Atrial Fibrillation Lab Used: [facility] [place] Site: [place] INR POC 2.9  Dietary changes: no    Health status changes: no    Bleeding/hemorrhagic complications: no    Recent/future hospitalizations: no    Any changes in medication regimen? no    Recent/future dental: no  Any missed doses?: no       Is patient compliant with meds? yes       Allergies: 1)  ! Codeine  Anticoagulation Management History:      The patient is taking warfarin and comes in today for a routine follow up visit.  Positive risk factors for bleeding include presence of serious comorbidities.  Negative risk factors for bleeding include an age less than 50 years old.  The bleeding index is 'intermediate risk'.  Positive CHADS2 values include History of CHF, History of HTN, and History of Diabetes.  Negative CHADS2 values include Age > 33 years old.  The start date was 03/27/2008.  Her last INR was 1.3.  Anticoagulation responsible provider: Daleen Squibb MD, Maisie Fus.  INR POC: 2.9.  Cuvette Lot#: [id number].  Exp: 10/11.    Anticoagulation Management Assessment/Plan:      The patient's current anticoagulation dose is Coumadin 5 mg tabs: once daily.  The target INR is 2 - 3.  The next INR is due 02/17/2010.  Anticoagulation instructions were given to patient.  Results were reviewed/authorized by Vashti Hey, RN.  She was notified by Vashti Hey RN.         Prior Anticoagulation Instructions: INR 2.0 Continue coumadin 2.5mg  once daily except 5mg  on Wednesdays  Current Anticoagulation Instructions: INR 2.9 Continue coumadin 2.5mg  once daily except 5mg  on Wednesdays

## 2010-08-03 NOTE — Assessment & Plan Note (Signed)
Summary: 2 mth fu per checkout on 08/26/2009/sn   Primary Provider:  Katharine Look M.D.  CC:  weakess.  History of Present Illness: [name] returns to the office as scheduled for continued assessment and treatment of multiple medical issues, most notably orthostatic hypotension, syncope, diabetes, hypertension, paroxysmal atrial fibrillation and dyslipidemia.  Since her last visit, she has done remarkably well.  She has not been seen in the emergency department nor required hospital admission.  She is getting around fairly well without much in the way of dyspnea or chest discomfort.  She is limited by degenerative disc disease, but is not interested in additional surgical intervention.  She was treated with steroids, but this resulted in a massive weight gain and exacerbation of diabetes.  Currently, she is managed with noninflammatory agents.  Blood pressure has been good as far she knows.  She gained over 30 pounds, but has not loss back more than half of that.  She notes no significant edema,  no orthopnea nor PND.  There has been no cough, no sputum production,  no change in appetite or bowel habit.  She experiences occasional tachypalpitations without other associated symptoms.  Preventive Screening-Counseling & Management  Alcohol-Tobacco     Smoking Status: quit  Current Medications (verified): 1)  Omeprazole 20 Mg Tbec (Omeprazole) .... Two Times A Day 2)  Lantus 100 Unit/ml Soln (Insulin Glargine) .... Take 30 Units At Bedtime 3)  Glipizide 10 Mg Tabs (Glipizide) .... Once Daily 4)  Insulin Humulin R .... As Directed 5)  Synthroid 125 Mcg Tabs (Levothyroxine Sodium) .... Take 1 Tab Daily 6)  Nitrostat 0.4 Mg Subl (Nitroglycerin) .... As Needed 7)  Zofran 4 Mg Tabs (Ondansetron Hcl) .... Once Daily 8)  Coumadin 5 Mg Tabs (Warfarin Sodium) .... Once Daily 9)  Metformin Hcl 1000 Mg Tabs (Metformin Hcl) .... Two Times A Day 10)  Skelaxin 800 Mg Tabs (Metaxalone) .... 3 Times  Daily 11)  Ativan 1 Mg Tabs (Lorazepam) .... 3 Times Daily 12)  Citalopram Hydrobromide 20 Mg Tabs (Citalopram Hydrobromide) .... Take 1 Tab Daily 13)  Reglan 10 Mg Tabs (Metoclopramide Hcl) .... Ac and Hs 14)  Simvastatin 40 Mg Tabs (Simvastatin) .Marland Kitchen.. 1 By Mouth Daily 15)  Furosemide 20 Mg Tabs (Furosemide) .... Take One Tablet By Mouth Daily As Needed Edema 16)  Klor-Con M20 20 Meq Cr-Tabs (Potassium Chloride Crys Cr) .Marland Kitchen.. 1 By Mouth As Needed With Lasix  Allergies (verified): 1)  ! Codeine  Past History:  PMH, FH, and Social History reviewed and updated.  Past Medical History: Chest pain: Negative cardiac catheterization in 2002; negative stress nuclear study in 2008 Congestive Heart Failure-normal LV systolic function PAF-2009 Chronic anticoagulation Diabetes Type 2-insulin therapy; exacerbated by prednisone Syncope-admitted 05/2009; magnetic resonance imaging/MRA-negative; etiology thought to be orthostasis            secondary to drugs and dehydration Gastroesophageal reflux disease; irritable bowel syndrome; hiatal hernia; gastroparesis Hyperlipidemia Hypertension Hypothyroid Chronic LBP: Surgical intervention in 1996 Obesity Obstructive sleep apnea treated with CPAP Anemia: H/H of 10/30 with a normal MCV in 12/09  Social History: Smoking Status:  quit  Review of Systems       See history of present illness.  Vital Signs:  Patient profile:   60 year old female Height:      65 inches Weight:      267 pounds BMI:     44.59 Pulse rate:   99 / minute Pulse (ortho):   101 /  minute Resp:     20 per minute BP sitting:   131 / 71  (right arm) BP standing:   93 / 56  Vitals Entered By: Marrion Coy, CNA (December 28, 2009 1:40 PM)  Serial Vital Signs/Assessments:  Time      Position  BP       Pulse  Resp  Temp     By 3:23 PM   Lying LA  107/58   88                    Tammy Sanders RN 3:23 PM   Sitting   115/60   96                    Tammy Sanders RN 3:23 PM    Standing  93/56    101                   Tammy Sanders RN  Comments: 3:23 PM no signs during vs By: Teressa Lower RN    Physical Exam  General:  Obese; well- developed; no acute distress   Neck-No JVD; no carotid bruits: Lungs-Clear; breath sounds somewhat coarse at bases Cardiovascular-normal PMI; normal S1 and S2; grade 2/6 early systolic ejection murmur Abdomen-BS normal; soft and non-tender without masses or organomegaly:  Musculoskeletal-No deformities, no cyanosis or clubbing: Neurologic-Normal cranial nerves; symmetric strength and tone:  Skin-Warm, no significant lesions: Extremities-Nl distal pulses; trace edema:     Impression & Recommendations:  Problem # 1:  HYPOTENSION, ORTHOSTATIC (ICD-458.0) Symptoms have resolved; orthostatic measurement of blood pressure is improved; current medications will be continued.  Problem # 2:  CONGESTIVE HEART FAILURE, MILD (ICD-428.0) Compensated with modest therapy; no recurrence of symptoms in recent months.  Problem # 3:  ATRIAL FIBRILLATION (ICD-427.31) Arrhythmia remains paroxysmal and perhaps asymptomatic.  Current management will be continued.  Problem # 4:  HYPERTENSION (ICD-401.9) Blood pressure is good; current medications will be continued.  Problem # 5:  HYPERLIPIDEMIA-MIXED (ICD-272.4)  Lipid profile was excellent 8 months ago with total cholesterol of 141, triglycerides 190, HDL 44 and LDL 59.  Current medications will be continued.  I will reassess this nice woman in 9 months.  She is advised to maintain adequate salt and fluid intake throughout the summer.     Other Orders: T-CBC w/Diff ([id number]) T-Comprehensive Metabolic Panel [phone]) Hemoccult Cards (Take Home) (Hemoccult Cards)  Patient Instructions: 1)  Your physician recommends that you schedule a follow-up appointment in: 8 months 2)  Your physician recommends that you return for lab work in: today 3)  Your physician has asked that you  test your stool for blood. It is necessary to test 3 different stool specimens for accuracy. You will be given 3 hemoccult cards for specimen collection. For each stool specimen, place a small portion of stool sample (from 2 different areas of the stool) into the 2 squares on the card. Close card. Repeat with 2 more stool specimens. Bring the cards back to the office for testing. 4)  Please maintain adequate salt and fluid intake

## 2010-08-03 NOTE — Medication Information (Signed)
Summary: ccr-lr  Anticoagulant Therapy  Managed by: Vashti Hey, RN PCP: Katharine Look M.D. Supervising MD: Daleen Squibb MD, Maisie Fus Indication 1: Atrial Fibrillation Lab Used: [facility] [place] Site: [place] INR POC 2.6  Dietary changes: no    Health status changes: no    Bleeding/hemorrhagic complications: no    Recent/future hospitalizations: no    Any changes in medication regimen? no    Recent/future dental: no  Any missed doses?: no       Is patient compliant with meds? yes       Allergies: 1)  ! Codeine  Anticoagulation Management History:      The patient is taking warfarin and comes in today for a routine follow up visit.  Positive risk factors for bleeding include presence of serious comorbidities.  Negative risk factors for bleeding include an age less than 66 years old.  The bleeding index is 'intermediate risk'.  Positive CHADS2 values include History of CHF, History of HTN, and History of Diabetes.  Negative CHADS2 values include Age > 39 years old.  The start date was 03/27/2008.  Her last INR was 1.3.  Anticoagulation responsible provider: Daleen Squibb MD, Maisie Fus.  INR POC: 2.6.  Cuvette Lot#: [id number].  Exp: 10/11.    Anticoagulation Management Assessment/Plan:      The patient's current anticoagulation dose is Coumadin 5 mg tabs: once daily.  The target INR is 2 - 3.  The next INR is due 10/15/2009.  Anticoagulation instructions were given to patient.  Results were reviewed/authorized by Vashti Hey, RN.  She was notified by Vashti Hey RN.         Prior Anticoagulation Instructions: INR 2.5 Continue coumadin 2.5mg  once daily   Current Anticoagulation Instructions: INR 2.6 Continue coumadin 2.5mg  once daily

## 2010-08-03 NOTE — Medication Information (Signed)
Summary: ccr-lr  Anticoagulant Therapy  Managed by: Vashti Hey, RN PCP: Katharine Look M.D. Supervising MD: Dietrich Pates MD, Molly Maduro Indication 1: Atrial Fibrillation Lab Used: [facility] [place] Site: [place] INR POC 2.2  Dietary changes: no    Health status changes: no    Bleeding/hemorrhagic complications: no    Recent/future hospitalizations: no    Any changes in medication regimen? no    Recent/future dental: no  Any missed doses?: no       Is patient compliant with meds? yes       Allergies: 1)  ! Codeine  Anticoagulation Management History:      The patient is taking warfarin and comes in today for a routine follow up visit.  Positive risk factors for bleeding include presence of serious comorbidities.  Negative risk factors for bleeding include an age less than 13 years old.  The bleeding index is 'intermediate risk'.  Positive CHADS2 values include History of CHF, History of HTN, and History of Diabetes.  Negative CHADS2 values include Age > 53 years old.  The start date was 03/27/2008.  Her last INR was 1.3.  Anticoagulation responsible provider: Dietrich Pates MD, Molly Maduro.  INR POC: 2.2.  Cuvette Lot#: [id number].  Exp: 10/11.    Anticoagulation Management Assessment/Plan:      The patient's current anticoagulation dose is Coumadin 5 mg tabs: once daily.  The target INR is 2 - 3.  The next INR is due 11/12/2009.  Anticoagulation instructions were given to patient.  Results were reviewed/authorized by Vashti Hey, RN.  She was notified by Vashti Hey RN.         Prior Anticoagulation Instructions: INR 2.6 Continue coumadin 2.5mg  once daily   Current Anticoagulation Instructions: INR 2.2 Continue coumadin 2.5mg  once daily

## 2010-08-03 NOTE — Medication Information (Signed)
Summary: ccr-lr  Anticoagulant Therapy  Managed by: Vashti Hey, RN PCP: Katharine Look M.D. Supervising MD: Dietrich Pates MD, Molly Maduro Indication 1: Atrial Fibrillation Lab Used: [facility] [place] Site: [place] INR POC 2.4  Dietary changes: no    Health status changes: no    Bleeding/hemorrhagic complications: no    Recent/future hospitalizations: no    Any changes in medication regimen? no    Recent/future dental: no  Any missed doses?: no       Is patient compliant with meds? yes       Allergies: 1)  ! Codeine  Anticoagulation Management History:      The patient is taking warfarin and comes in today for a routine follow up visit.  Positive risk factors for bleeding include presence of serious comorbidities.  Negative risk factors for bleeding include an age less than 18 years old.  The bleeding index is 'intermediate risk'.  Positive CHADS2 values include History of CHF, History of HTN, and History of Diabetes.  Negative CHADS2 values include Age > 77 years old.  The start date was 03/27/2008.  Her last INR was 1.3.  Anticoagulation responsible provider: Dietrich Pates MD, Molly Maduro.  INR POC: 2.4.  Cuvette Lot#: [id number].  Exp: 10/11.    Anticoagulation Management Assessment/Plan:      The patient's current anticoagulation dose is Coumadin 5 mg tabs: once daily.  The target INR is 2 - 3.  The next INR is due 06/21/2010.  Anticoagulation instructions were given to patient.  Results were reviewed/authorized by Vashti Hey, RN.  She was notified by Vashti Hey RN.         Prior Anticoagulation Instructions: INR 2.2 Continue coumadin 2.5mg  once daily except 5mg  on Wednesdays  Current Anticoagulation Instructions: INR 2.4 Continue coumadin 2.5mg  once daily except 5mg  on Wednesdays

## 2010-08-03 NOTE — Miscellaneous (Signed)
Summary: cmp,liver,aic labs 08/20/2009  Clinical Lists Changes  Observations: Added new observation of CALCIUM: 9.2 mg/dL (91/47/8295 6:21) Added new observation of ALBUMIN: 4.1 g/dL (30/86/5784 6:96) Added new observation of PROTEIN, TOT: 6.3 g/dL (29/52/8413 2:44) Added new observation of SGPT (ALT): 14 units/L (08/20/2009 8:52) Added new observation of SGOT (AST): 14 units/L (08/20/2009 8:52) Added new observation of ALK PHOS: 55 units/L (08/20/2009 8:52) Added new observation of BILI DIRECT: 0.1 mg/dL (07/06/7251 6:64) Added new observation of CREATININE: 0.78 mg/dL (40/34/7425 9:56) Added new observation of BUN: 15 mg/dL (38/75/6433 2:95) Added new observation of BG RANDOM: 236 mg/dL (18/84/1660 6:30) Added new observation of CO2 PLSM/SER: 19 meq/L (08/20/2009 8:52) Added new observation of CL SERUM: 103 meq/L (08/20/2009 8:52) Added new observation of K SERUM: 4.3 meq/L (08/20/2009 8:52) Added new observation of NA: 137 meq/L (08/20/2009 8:52) Added new observation of TSH: 3.973 microintl units/mL (08/20/2009 8:52) Added new observation of HGBA1C: 9.5 % (08/20/2009 8:52)

## 2010-08-03 NOTE — Medication Information (Signed)
Summary: ccr-lr  Anticoagulant Therapy  Managed by: Vashti Hey, RN PCP: Katharine Look M.D. Supervising MD: Dietrich Pates MD, Molly Maduro Indication 1: Atrial Fibrillation Lab Used: [facility] [place] Site: [place] INR POC 1.1  Dietary changes: no    Health status changes: no    Bleeding/hemorrhagic complications: no    Recent/future hospitalizations: yes       Details: Scheduled for 2nd neck injection today  Any changes in medication regimen? no    Recent/future dental: no  Any missed doses?: yes     Details: coumadin has been on hold  Last dose was 03/18/10  Is patient compliant with meds? yes       Allergies: 1)  ! Codeine  Anticoagulation Management History:      The patient is taking warfarin and comes in today for a routine follow up visit.  Positive risk factors for bleeding include presence of serious comorbidities.  Negative risk factors for bleeding include an age less than 57 years old.  The bleeding index is 'intermediate risk'.  Positive CHADS2 values include History of CHF, History of HTN, and History of Diabetes.  Negative CHADS2 values include Age > 42 years old.  The start date was 03/27/2008.  Her last INR was 1.3.  Anticoagulation responsible provider: Dietrich Pates MD, Molly Maduro.  INR POC: 1.1.  Exp: 10/11.    Anticoagulation Management Assessment/Plan:      The patient's current anticoagulation dose is Coumadin 5 mg tabs: once daily.  The target INR is 2 - 3.  The next INR is due 04/01/2010.  Anticoagulation instructions were given to patient.  Results were reviewed/authorized by Vashti Hey, RN.  She was notified by Vashti Hey RN.         Prior Anticoagulation Instructions: INR 1.8 Take coumadin 5mg  tonight then resume 2.5mg  once daily except 5mg  on Wednesdays Scheduled for 2nd injection in neck 03/22/10 Will come for INR check that am before procedure  Current Anticoagulation Instructions: INR 1.1 Scheduled for 2nd neck injection  today Take coumadin 5mg  tonight and tomorrow night then resume 2.5mg  once daily except 5mg  on Wednesdays

## 2010-08-03 NOTE — Medication Information (Signed)
Summary: ccr-lr  Anticoagulant Therapy  Managed by: Vashti Hey, RN PCP: Katharine Look M.D. Supervising MD: Diona Browner MD, Remi Deter Indication 1: Atrial Fibrillation Lab Used: [facility] [place] Site: [place] INR POC 1.8  Dietary changes: no    Health status changes: no    Bleeding/hemorrhagic complications: no    Recent/future hospitalizations: no    Any changes in medication regimen? no    Recent/future dental: no  Any missed doses?: no       Is patient compliant with meds? yes       Allergies: 1)  ! Codeine  Anticoagulation Management History:      The patient is taking warfarin and comes in today for a routine follow up visit.  Positive risk factors for bleeding include presence of serious comorbidities.  Negative risk factors for bleeding include an age less than 50 years old.  The bleeding index is 'intermediate risk'.  Positive CHADS2 values include History of CHF, History of HTN, and History of Diabetes.  Negative CHADS2 values include Age > 41 years old.  The start date was 03/27/2008.  Her last INR was 1.3.  Anticoagulation responsible provider: Diona Browner MD, Remi Deter.  INR POC: 1.8.  Cuvette Lot#: [id number].  Exp: 10/11.    Anticoagulation Management Assessment/Plan:      The patient's current anticoagulation dose is Coumadin 5 mg tabs: once daily.  The target INR is 2 - 3.  The next INR is due 12/28/2009.  Anticoagulation instructions were given to patient.  Results were reviewed/authorized by Vashti Hey, RN.  She was notified by Vashti Hey RN.         Prior Anticoagulation Instructions: INR 1.3 Take coumadin 1 tablet tonight and tomorrow night then resume 1/2 tablet once daily    Current Anticoagulation Instructions: INR 1.8 Increase coumadin to 2.5mg  once daily except 5mg  on Wednesdays

## 2010-08-03 NOTE — Medication Information (Signed)
Summary: ccr-lr  Anticoagulant Therapy  Managed by: Vashti Hey, RN PCP: Katharine Look M.D. Supervising MD: Dietrich Pates MD, Molly Maduro Indication 1: Atrial Fibrillation Lab Used: [facility] [place] Site: [place] INR POC 1.3  Dietary changes: yes       Details: has more salads  Health status changes: no    Bleeding/hemorrhagic complications: no    Recent/future hospitalizations: no    Any changes in medication regimen? no    Recent/future dental: no  Any missed doses?: yes     Details: denies missing doses  Is patient compliant with meds? yes       Allergies: 1)  ! Codeine  Anticoagulation Management History:      The patient is taking warfarin and comes in today for a routine follow up visit.  Positive risk factors for bleeding include presence of serious comorbidities.  Negative risk factors for bleeding include an age less than 66 years old.  The bleeding index is 'intermediate risk'.  Positive CHADS2 values include History of CHF, History of HTN, and History of Diabetes.  Negative CHADS2 values include Age > 32 years old.  The start date was 03/27/2008.  Her last INR was 1.3.  Anticoagulation responsible provider: Dietrich Pates MD, Molly Maduro.  INR POC: 1.3.  Cuvette Lot#: [id number].  Exp: 10/11.    Anticoagulation Management Assessment/Plan:      The patient's current anticoagulation dose is Coumadin 5 mg tabs: once daily.  The target INR is 2 - 3.  The next INR is due 12/16/2009.  Anticoagulation instructions were given to patient.  Results were reviewed/authorized by Vashti Hey, RN.  She was notified by Vashti Hey RN.         Prior Anticoagulation Instructions: INR 2.2 Continue coumadin 2.5mg  once daily   Current Anticoagulation Instructions: INR 1.3 Take coumadin 1 tablet tonight and tomorrow night then resume 1/2 tablet once daily

## 2010-08-03 NOTE — Medication Information (Signed)
Summary: ccr-lr  Anticoagulant Therapy  Managed by: Vashti Hey, RN PCP: Katharine Look M.D. Supervising MD: Diona Browner MD, Remi Deter Indication 1: Atrial Fibrillation Lab Used: [facility] [place] Site: [place] INR POC 2.2  Dietary changes: no    Health status changes: no    Bleeding/hemorrhagic complications: no    Recent/future hospitalizations: yes       Details: In ED 07/08/09  Larey Seat at home and hit head   had CT  Normal  Any changes in medication regimen? no    Recent/future dental: no  Any missed doses?: no       Is patient compliant with meds? yes       Allergies: 1)  ! Codeine  Anticoagulation Management History:      The patient is taking warfarin and comes in today for a routine follow up visit.  Positive risk factors for bleeding include presence of serious comorbidities.  Negative risk factors for bleeding include an age less than 49 years old.  The bleeding index is 'intermediate risk'.  Positive CHADS2 values include History of CHF, History of HTN, and History of Diabetes.  Negative CHADS2 values include Age > 14 years old.  The start date was 03/27/2008.  Her last INR was 1.3.  Anticoagulation responsible provider: Diona Browner MD, Remi Deter.  INR POC: 2.2.  Cuvette Lot#: [id number].  Exp: 10/11.    Anticoagulation Management Assessment/Plan:      The patient's current anticoagulation dose is Coumadin 5 mg tabs: once daily.  The target INR is 2 - 3.  The next INR is due 08/20/2009.  Anticoagulation instructions were given to patient.  Results were reviewed/authorized by Vashti Hey, RN.  She was notified by Vashti Hey RN.         Prior Anticoagulation Instructions: INR 1.7 Take coumadin 1 1/2 tablets tonight then resume 1/2 tablet once daily   Current Anticoagulation Instructions: INR 2.2 Continue coumadin 2.5mg  once daily

## 2010-08-03 NOTE — Letter (Signed)
Summary: [place] [facility], agricultural at [facility]  [address] [place] [address]   Phone: [phone]  Fax: [phone]      January 06, 2010 MRN: [id number]   [facility] [address] [place], [place]  [address]   Dear Ms. [facility],  Your test ordered by Selena Batten has been reviewed by your physician (or physician assistant) and was found to be normal or stable. Your physician (or physician assistant) felt no changes were needed at this time.  ____ Echocardiogram  ____ Cardiac Stress Test  _x___ Lab Work  ____ Peripheral vascular study of arms, legs or neck  ____ CT scan or X-ray  ____ Lung or Breathing test  ____ Other:  Natilie your blood sugar is extremely elevated.  Labwork faxed to Dr. [name].  Please call his office to schedule follow up about your diabetes. Enclosed is a copy of your labwork for your records.  Thank you, Tammy Allyne Gee RN    Spencer Bing, MD, Lenise Arena.C.Gaylord Shih, MD, F.A.C.C Lewayne Bunting, MD, F.A.C.C Nona Dell, MD, F.A.C.C Charlton Haws, MD, Lenise Arena.C.C

## 2010-08-05 NOTE — Medication Information (Signed)
Summary: ccr  Anticoagulant Therapy  Managed by: Vashti Hey, RN PCP: Katharine Look M.D. Supervising MD: Dietrich Pates MD, Molly Maduro Indication 1: Atrial Fibrillation Lab Used: [facility] [place] Site: [place] INR POC 2.7  Dietary changes: no    Health status changes: no    Bleeding/hemorrhagic complications: no    Recent/future hospitalizations: no    Any changes in medication regimen? no    Recent/future dental: no  Any missed doses?: no       Is patient compliant with meds? yes       Allergies: 1)  ! Codeine  Anticoagulation Management History:      The patient is taking warfarin and comes in today for a routine follow up visit.  Positive risk factors for bleeding include presence of serious comorbidities.  Negative risk factors for bleeding include an age less than 20 years old.  The bleeding index is 'intermediate risk'.  Positive CHADS2 values include History of CHF, History of HTN, and History of Diabetes.  Negative CHADS2 values include Age > 34 years old.  The start date was 03/27/2008.  Her last INR was 1.3.  Anticoagulation responsible provider: Dietrich Pates MD, Molly Maduro.  INR POC: 2.7.  Cuvette Lot#: [id number].  Exp: 10/11.    Anticoagulation Management Assessment/Plan:      The patient's current anticoagulation dose is Coumadin 5 mg tabs: once daily.  The target INR is 2 - 3.  The next INR is due 07/19/2010.  Anticoagulation instructions were given to patient.  Results were reviewed/authorized by Vashti Hey, RN.  She was notified by Vashti Hey RN.         Prior Anticoagulation Instructions: INR 2.4 Continue coumadin 2.5mg  once daily except 5mg  on Wednesdays  Current Anticoagulation Instructions: INR 2.7 Continue coumadin 2.5mg  once daily except 5mg  on Wednesdays

## 2010-08-05 NOTE — Medication Information (Signed)
Summary: ccr-lr  Anticoagulant Therapy  Managed by: Vashti Hey, RN PCP: Katharine Look M.D. Supervising MD: Diona Browner MD, Remi Deter Indication 1: Atrial Fibrillation Lab Used: [facility] [place] Site: [place] INR POC 2.4  Dietary changes: no    Health status changes: no    Bleeding/hemorrhagic complications: no    Recent/future hospitalizations: no    Any changes in medication regimen? no    Recent/future dental: no  Any missed doses?: no       Is patient compliant with meds? yes       Allergies: 1)  ! Codeine  Anticoagulation Management History:      The patient is taking warfarin and comes in today for a routine follow up visit.  Positive risk factors for bleeding include presence of serious comorbidities.  Negative risk factors for bleeding include an age less than 52 years old.  The bleeding index is 'intermediate risk'.  Positive CHADS2 values include History of CHF, History of HTN, and History of Diabetes.  Negative CHADS2 values include Age > 22 years old.  The start date was 03/27/2008.  Her last INR was 1.3.  Anticoagulation responsible provider: Diona Browner MD, Remi Deter.  INR POC: 2.4.  Cuvette Lot#: [id number].  Exp: 10/11.    Anticoagulation Management Assessment/Plan:      The patient's current anticoagulation dose is Coumadin 5 mg tabs: once daily.  The target INR is 2 - 3.  The next INR is due 08/18/2010.  Anticoagulation instructions were given to patient.  Results were reviewed/authorized by Vashti Hey, RN.  She was notified by Vashti Hey RN.         Prior Anticoagulation Instructions: INR 2.7 Continue coumadin 2.5mg  once daily except 5mg  on Wednesdays  Current Anticoagulation Instructions: INR 2.4 Continue coumadin 2.5mg  once daily except 5mg  on Wednesdays

## 2010-08-08 IMAGING — CR DG CHEST 1V PORT
1 series · 1 of 1 positions shown · non-contrast
Comparison: 10/16/2006

CLINICAL DATA: Shortness of breath and CHF.

PORTABLE CHEST - 1 VIEW

[AP]
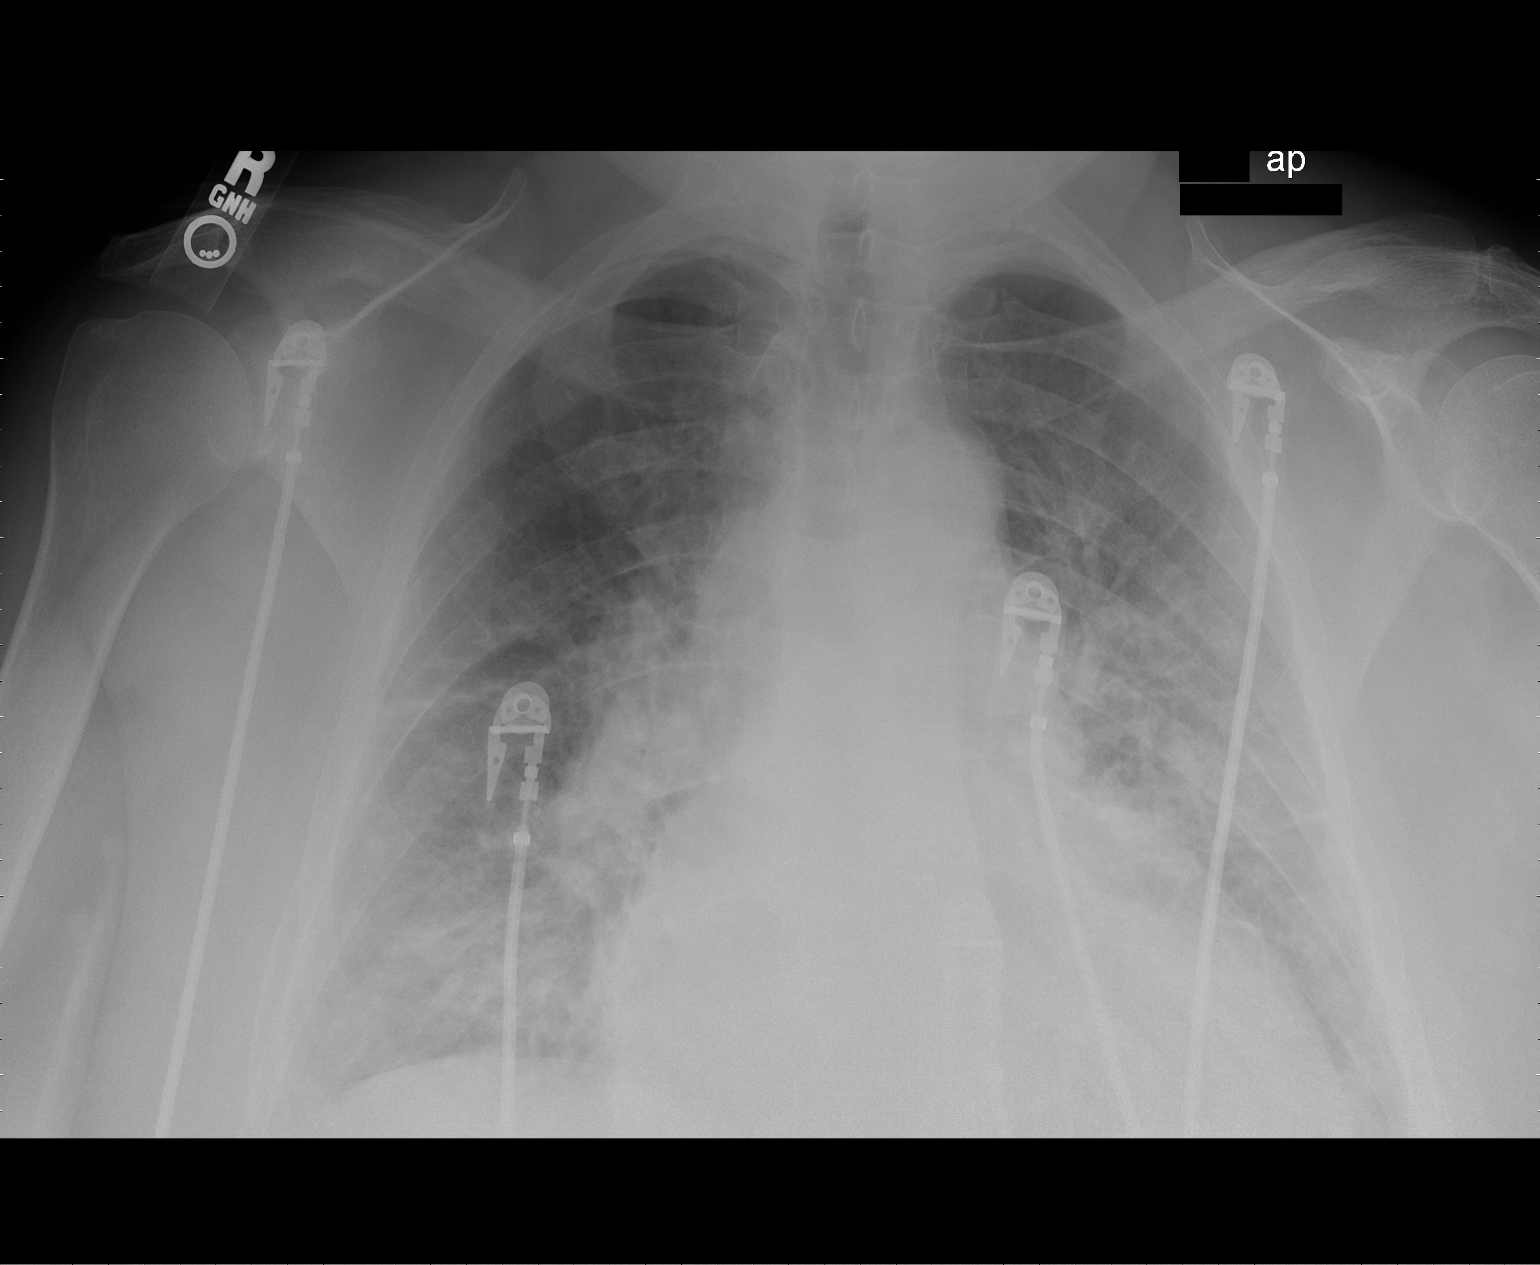

[1 of 1 positions shown; findings below may reference images not displayed]

FINDINGS: There is evidence of acute congestive heart failure with
moderate edema present.  The heart is also moderately enlarged.  No
large pleural effusions are appreciated.
IMPRESSION: Moderate congestive heart failure.

## 2010-08-15 IMAGING — CR DG CHEST 2V
2 series · 2 of 2 positions shown · non-contrast
Comparison: 03/23/2008

CLINICAL DATA: 57-year-old female antral fibrillation

CHEST - 2 VIEW

[w chest pa]
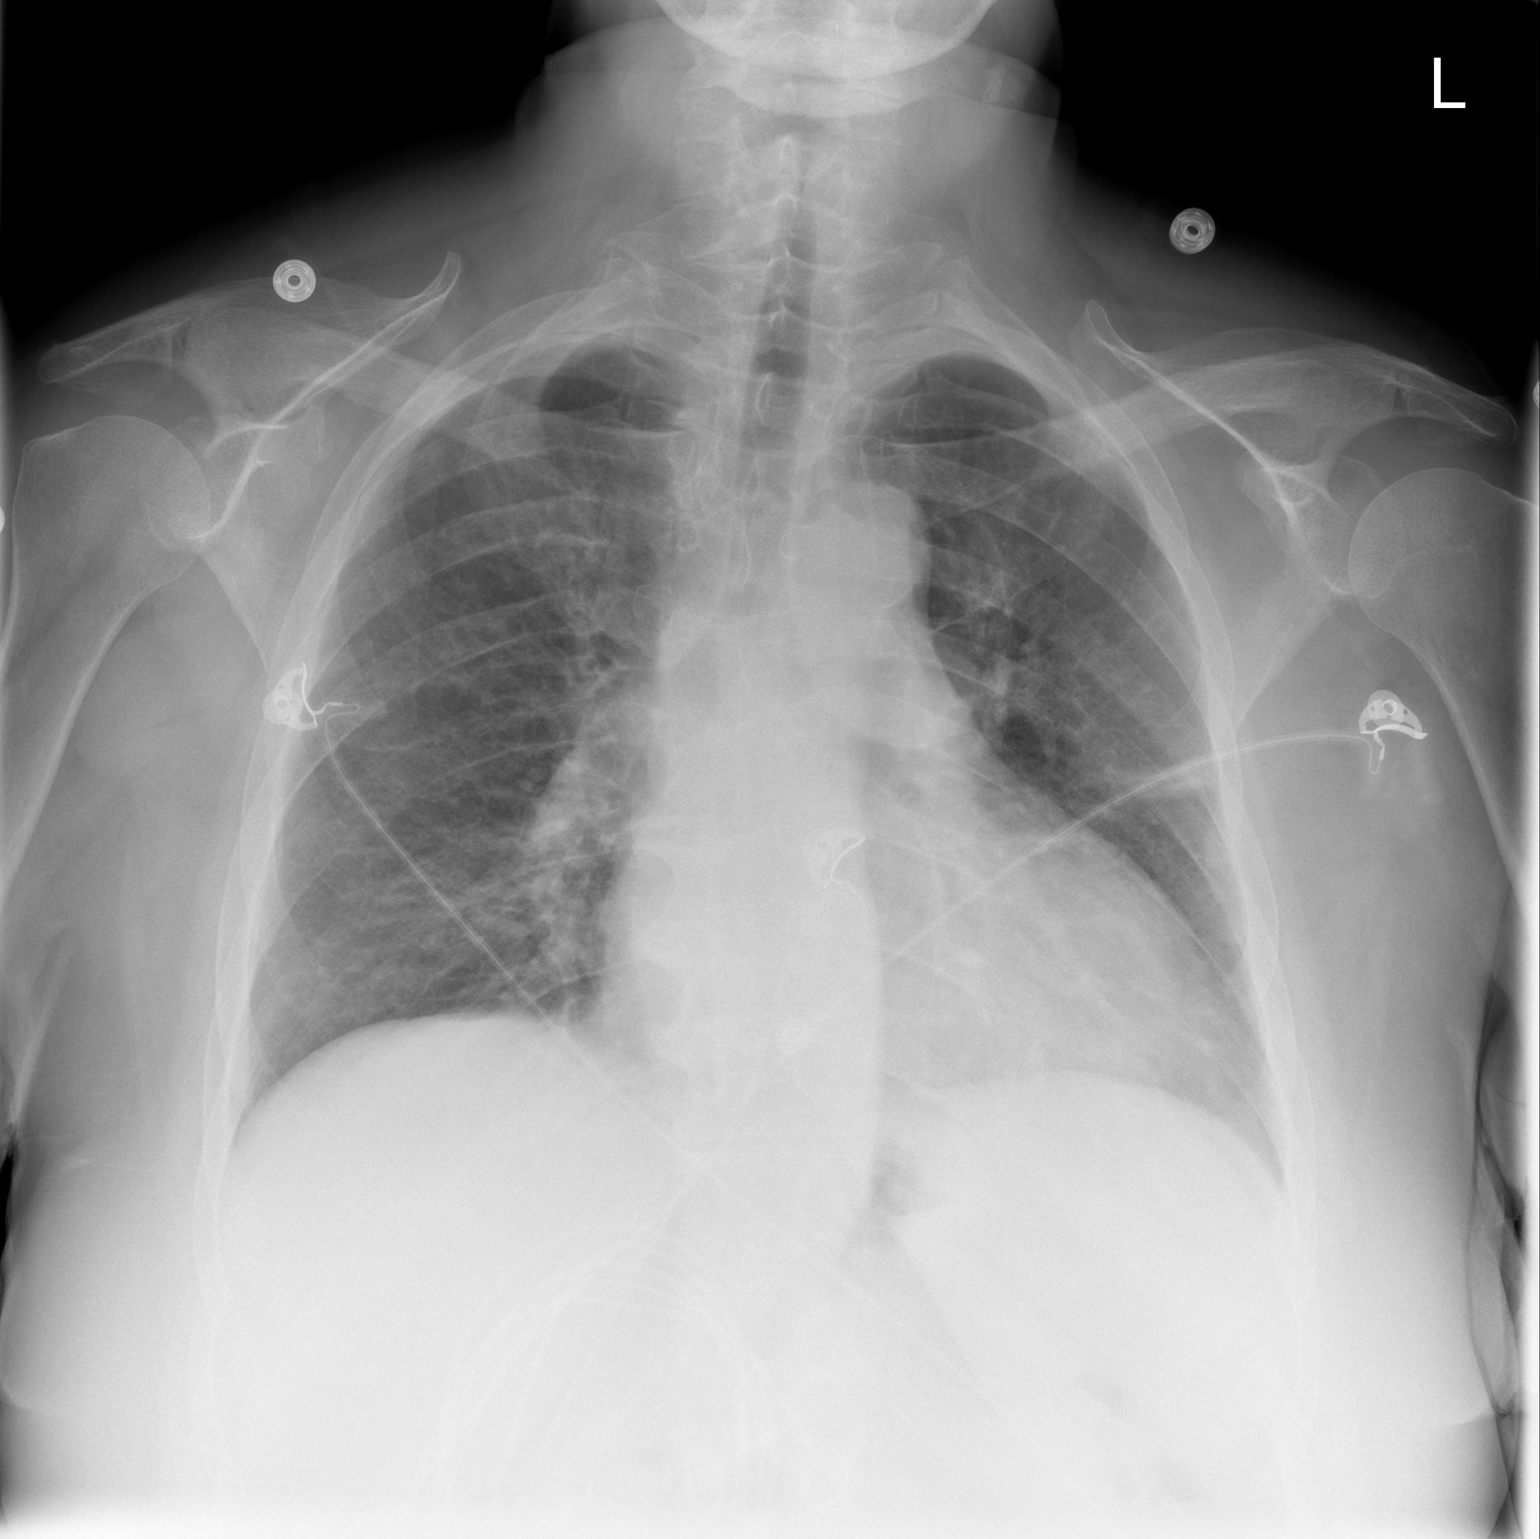

[w chest lat]
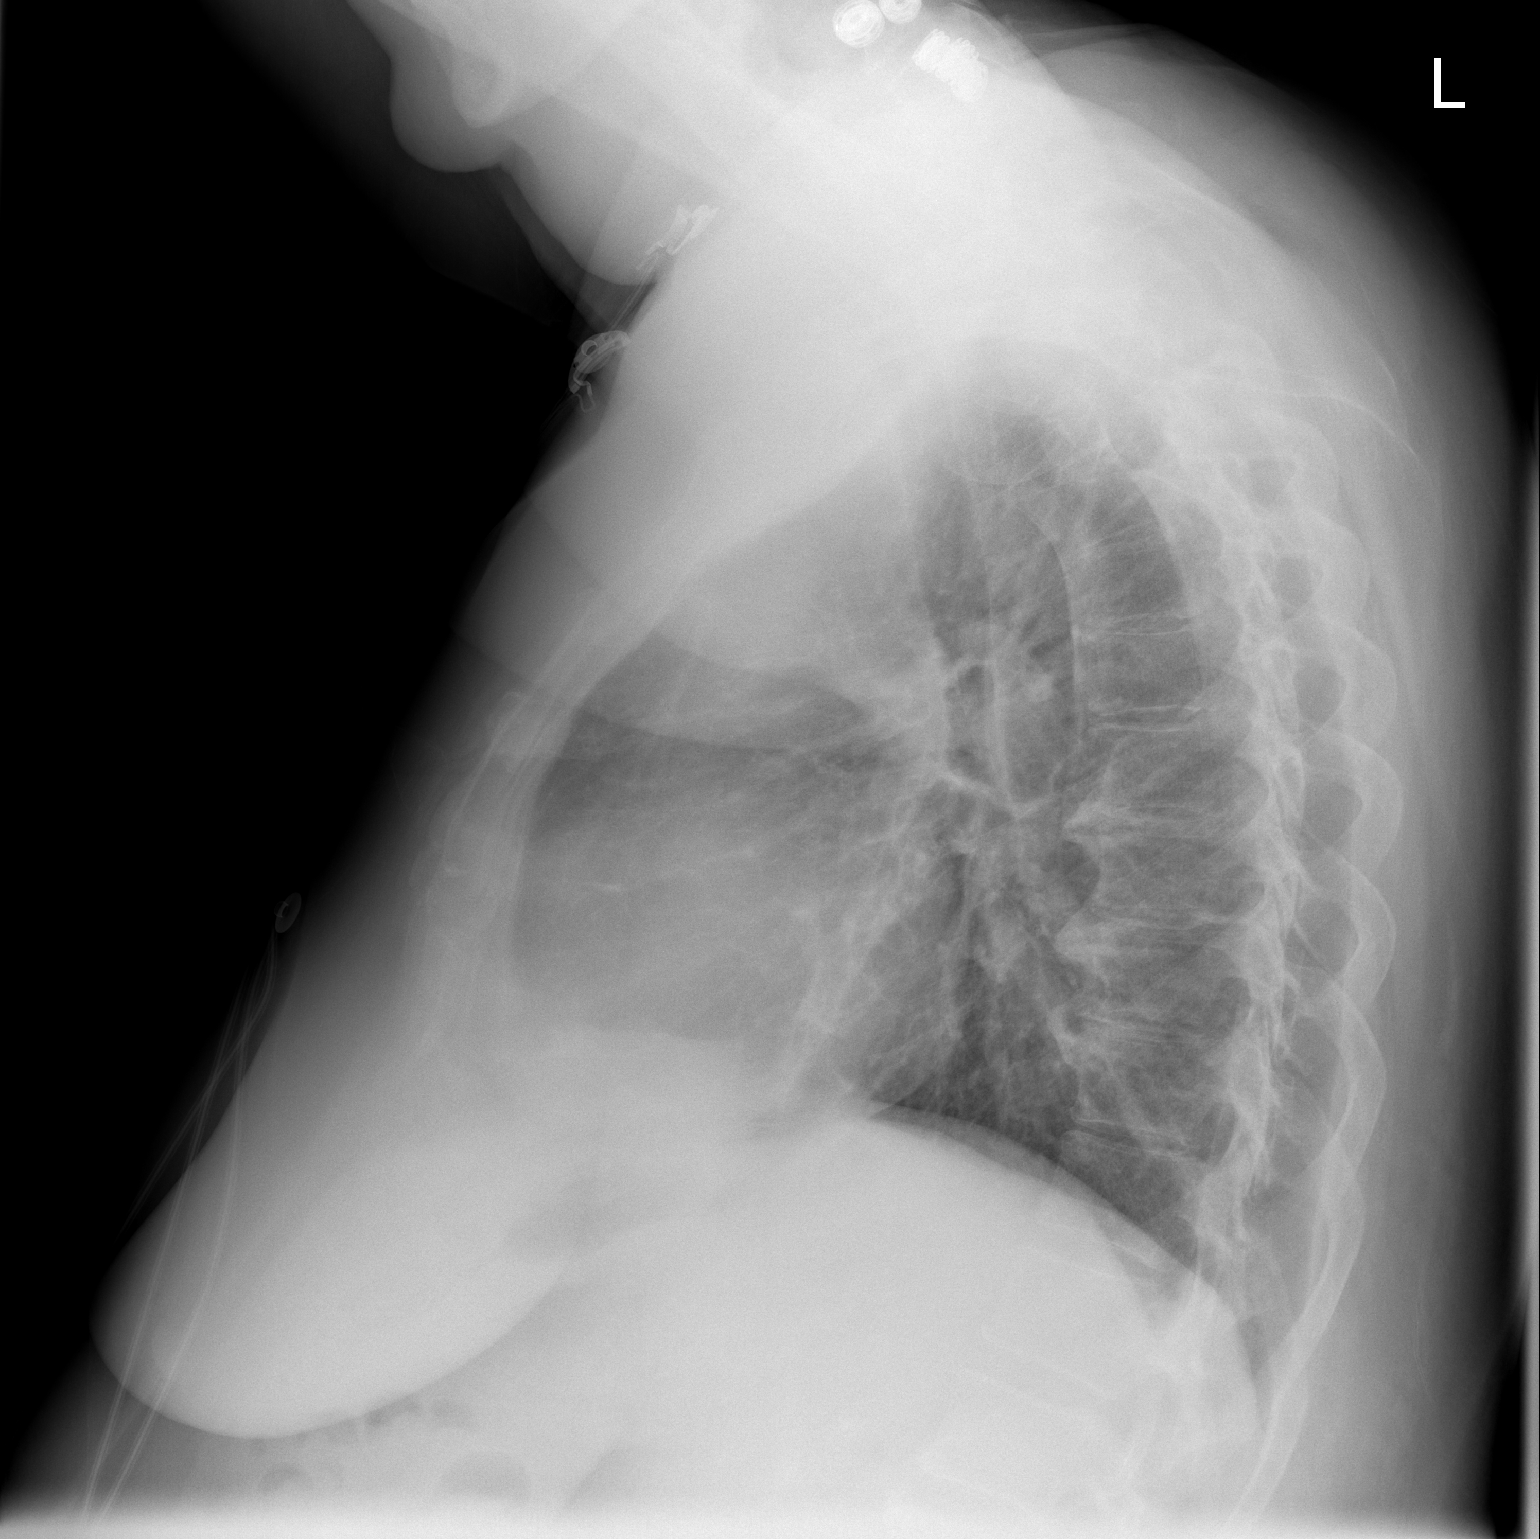

[2 of 2 positions shown; findings below may reference images not displayed]

FINDINGS: Mild cardiac enlargement with improving edema noted.
Chronic central bronchial thickening remains.  No large effusion or
pneumothorax.  Trachea is midline.
IMPRESSION: Improving CHF.

## 2010-08-16 IMAGING — CR DG CHEST 1V PORT
1 series · 1 of 1 positions shown · non-contrast
Comparison: 03/30/2008

CLINICAL DATA: Atrial fibrillation

PORTABLE CHEST - 1 VIEW

[view not recorded]
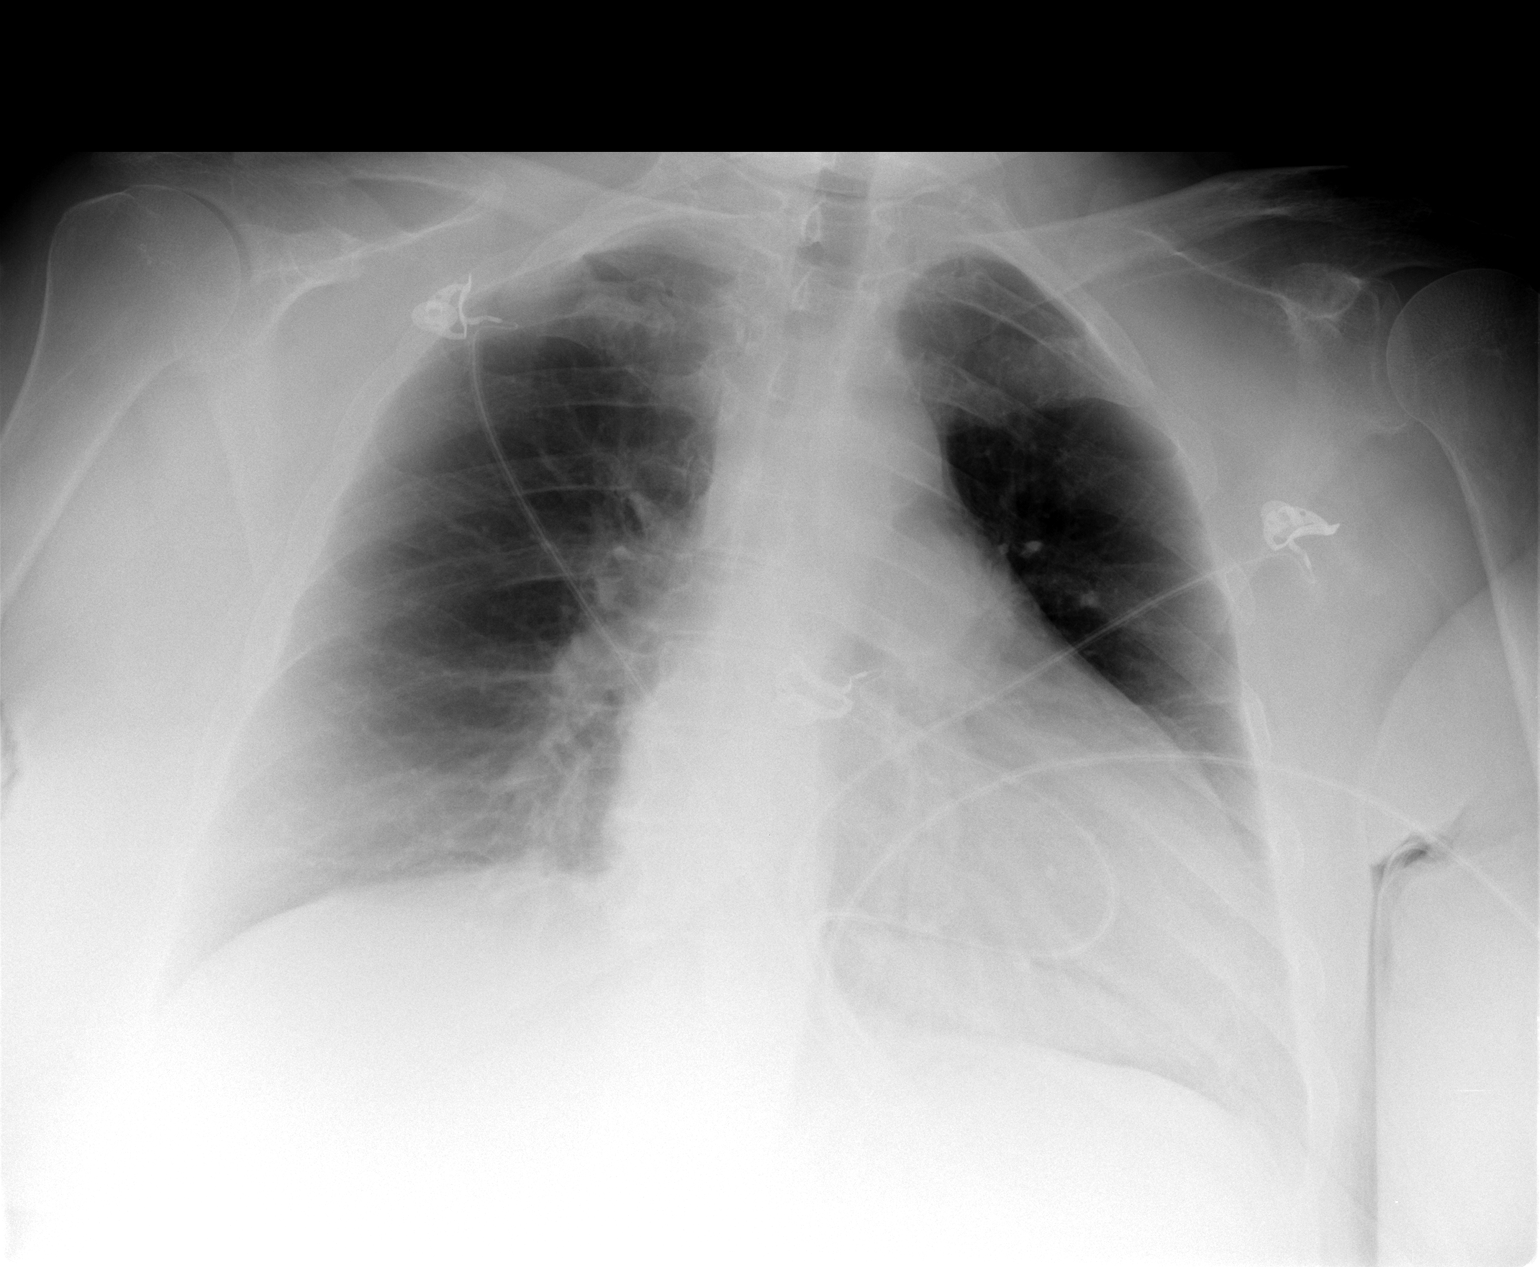

[1 of 1 positions shown; findings below may reference images not displayed]

FINDINGS: Cardiac enlargement.  The lungs are clear.
IMPRESSION: Cardiac enlargement without acute pulmonary process.

## 2010-08-19 ENCOUNTER — Encounter (INDEPENDENT_AMBULATORY_CARE_PROVIDER_SITE_OTHER): Payer: BC Managed Care – PPO

## 2010-08-19 ENCOUNTER — Encounter: Payer: Self-pay | Admitting: Cardiology

## 2010-08-19 DIAGNOSIS — I4891 Unspecified atrial fibrillation: Secondary | ICD-10-CM

## 2010-08-19 DIAGNOSIS — Z7901 Long term (current) use of anticoagulants: Secondary | ICD-10-CM

## 2010-08-25 NOTE — Medication Information (Signed)
Summary: ccr/rm  Anticoagulant Therapy  Managed by: Vashti Hey, RN PCP: Katharine Look M.D. Supervising MD: Diona Browner MD, Remi Deter Indication 1: Atrial Fibrillation Lab Used: [facility] [place] Site: [place] INR POC 2.9  Dietary changes: no    Health status changes: no    Bleeding/hemorrhagic complications: no    Recent/future hospitalizations: no    Any changes in medication regimen? no    Recent/future dental: no  Any missed doses?: no       Is patient compliant with meds? yes       Allergies: 1)  ! Codeine  Anticoagulation Management History:      The patient is taking warfarin and comes in today for a routine follow up visit.  Positive risk factors for bleeding include presence of serious comorbidities.  Negative risk factors for bleeding include an age less than 54 years old.  The bleeding index is 'intermediate risk'.  Positive CHADS2 values include History of CHF, History of HTN, and History of Diabetes.  Negative CHADS2 values include Age > 40 years old.  The start date was 03/27/2008.  Her last INR was 1.3.  Anticoagulation responsible provider: Diona Browner MD, Remi Deter.  INR POC: 2.9.  Cuvette Lot#: [id number].  Exp: 10/11.    Anticoagulation Management Assessment/Plan:      The patient's current anticoagulation dose is Coumadin 5 mg tabs: once daily.  The target INR is 2 - 3.  The next INR is due 09/16/2010.  Anticoagulation instructions were given to patient.  Results were reviewed/authorized by Vashti Hey, RN.  She was notified by Vashti Hey RN.         Prior Anticoagulation Instructions: INR 2.4 Continue coumadin 2.5mg  once daily except 5mg  on Wednesdays  Current Anticoagulation Instructions: INR 2.9 Continue coumadin 2.5mg  once daily except 5mg  on Wednesdays

## 2010-09-07 ENCOUNTER — Encounter (INDEPENDENT_AMBULATORY_CARE_PROVIDER_SITE_OTHER): Payer: Self-pay | Admitting: *Deleted

## 2010-09-14 NOTE — Letter (Signed)
Summary: Recall, Screening Colonoscopy Only  [facility]  [address]   [place], [place] [address]   Phone: [phone]  Fax: [phone]    September 07, 2010  [name] [address] [place], [place]  [address]  [place] May 05, 1951   Dear [name],   Our records indicate it is time to schedule your colonoscopy.  Please call our office at [phone] and ask for the nurse.   Thank you,    Carolan Clines, LPN  [facility] Ph: [phone]   Fax: [phone]

## 2010-09-18 LAB — URINALYSIS, ROUTINE W REFLEX MICROSCOPIC
Bilirubin Urine: NEGATIVE
Glucose, UA: NEGATIVE mg/dL
Ketones, ur: NEGATIVE mg/dL
pH: 6 (ref 5.0–8.0)

## 2010-09-18 LAB — DIFFERENTIAL
Lymphocytes Relative: 18 % (ref 12–46)
Lymphs Abs: 1.8 10*3/uL (ref 0.7–4.0)
Monocytes Relative: 6 % (ref 3–12)
Neutro Abs: 7.8 10*3/uL — ABNORMAL HIGH (ref 1.7–7.7)
Neutrophils Relative %: 76 % (ref 43–77)

## 2010-09-18 LAB — URINE CULTURE

## 2010-09-18 LAB — CBC
HCT: 34.7 % — ABNORMAL LOW (ref 36.0–46.0)
MCHC: 32.7 g/dL (ref 30.0–36.0)
MCV: 82.1 fL (ref 78.0–100.0)
RBC: 4.23 MIL/uL (ref 3.87–5.11)

## 2010-09-18 LAB — COMPREHENSIVE METABOLIC PANEL
BUN: 17 mg/dL (ref 6–23)
Calcium: 8.9 mg/dL (ref 8.4–10.5)
Glucose, Bld: 270 mg/dL — ABNORMAL HIGH (ref 70–99)
Total Protein: 6 g/dL (ref 6.0–8.3)

## 2010-09-18 LAB — URINE MICROSCOPIC-ADD ON

## 2010-09-18 LAB — TROPONIN I: Troponin I: 0.05 ng/mL (ref 0.00–0.06)

## 2010-09-18 LAB — CK TOTAL AND CKMB (NOT AT ARMC): Relative Index: INVALID (ref 0.0–2.5)

## 2010-09-18 LAB — PROTIME-INR: INR: 2.54 — ABNORMAL HIGH (ref 0.00–1.49)

## 2010-09-20 ENCOUNTER — Encounter: Payer: Self-pay | Admitting: Cardiology

## 2010-09-20 ENCOUNTER — Encounter (INDEPENDENT_AMBULATORY_CARE_PROVIDER_SITE_OTHER): Payer: BC Managed Care – PPO

## 2010-09-20 DIAGNOSIS — Z7901 Long term (current) use of anticoagulants: Secondary | ICD-10-CM

## 2010-09-20 DIAGNOSIS — I4891 Unspecified atrial fibrillation: Secondary | ICD-10-CM

## 2010-09-28 ENCOUNTER — Encounter: Payer: Self-pay | Admitting: Cardiology

## 2010-09-28 DIAGNOSIS — I4891 Unspecified atrial fibrillation: Secondary | ICD-10-CM

## 2010-09-30 NOTE — Medication Information (Signed)
Summary: ccr-lr  Anticoagulant Therapy  Managed by: Vashti Hey, RN PCP: Katharine Look M.D. Supervising MD: Dietrich Pates MD, Molly Maduro Indication 1: Atrial Fibrillation Lab Used: [facility] [place] Site: [place] INR POC 3.2  Dietary changes: no    Health status changes: no    Bleeding/hemorrhagic complications: no    Recent/future hospitalizations: no    Any changes in medication regimen? no    Recent/future dental: no  Any missed doses?: no       Is patient compliant with meds? yes       Allergies: 1)  ! Codeine  Anticoagulation Management History:      The patient is taking warfarin and comes in today for a routine follow up visit.  Positive risk factors for bleeding include presence of serious comorbidities.  Negative risk factors for bleeding include an age less than 10 years old.  The bleeding index is 'intermediate risk'.  Positive CHADS2 values include History of CHF, History of HTN, and History of Diabetes.  Negative CHADS2 values include Age > 85 years old.  The start date was 03/27/2008.  Her last INR was 1.3.  Anticoagulation responsible provider: Dietrich Pates MD, Molly Maduro.  INR POC: 3.2.  Cuvette Lot#: [id number].  Exp: 10/11.    Anticoagulation Management Assessment/Plan:      The patient's current anticoagulation dose is Coumadin 5 mg tabs: once daily.  The target INR is 2 - 3.  The next INR is due 10/18/2010.  Anticoagulation instructions were given to patient.  Results were reviewed/authorized by Vashti Hey, RN.  She was notified by Vashti Hey RN.         Prior Anticoagulation Instructions: INR 2.9 Continue coumadin 2.5mg  once daily except 5mg  on Wednesdays  Current Anticoagulation Instructions: INR 3.2 Hold coumadin tonight then resume 2.5mg  once daily except 5mg  on Wednesdays Increase greens

## 2010-10-06 LAB — COMPREHENSIVE METABOLIC PANEL
ALT: 17 U/L (ref 0–35)
AST: 17 U/L (ref 0–37)
AST: 24 U/L (ref 0–37)
Albumin: 3.6 g/dL (ref 3.5–5.2)
CO2: 25 mEq/L (ref 19–32)
Calcium: 9.3 mg/dL (ref 8.4–10.5)
Chloride: 107 mEq/L (ref 96–112)
Creatinine, Ser: 1.47 mg/dL — ABNORMAL HIGH (ref 0.4–1.2)
GFR calc Af Amer: 44 mL/min — ABNORMAL LOW (ref >60)
GFR calc Af Amer: >60 mL/min (ref >60)
Potassium: 4 mEq/L (ref 3.5–5.1)
Sodium: 140 mEq/L (ref 135–145)
Total Bilirubin: 0.3 mg/dL (ref 0.3–1.2)
Total Protein: 6.3 g/dL (ref 6.0–8.3)

## 2010-10-06 LAB — CBC
HCT: 32.5 % — ABNORMAL LOW (ref 36.0–46.0)
HCT: 33.4 % — ABNORMAL LOW (ref 36.0–46.0)
Hemoglobin: 10.9 g/dL — ABNORMAL LOW (ref 12.0–15.0)
Hemoglobin: 11 g/dL — ABNORMAL LOW (ref 12.0–15.0)
Hemoglobin: 11.1 g/dL — ABNORMAL LOW (ref 12.0–15.0)
MCHC: 33.5 g/dL (ref 30.0–36.0)
MCHC: 33.5 g/dL (ref 30.0–36.0)
MCV: 85 fL (ref 78.0–100.0)
MCV: 85.3 fL (ref 78.0–100.0)
MCV: 85.6 fL (ref 78.0–100.0)
Platelets: 211 10*3/uL (ref 150–400)
Platelets: 234 10*3/uL (ref 150–400)
RBC: 3.83 MIL/uL — ABNORMAL LOW (ref 3.87–5.11)
RDW: 15 % (ref 11.5–15.5)
RDW: 15.1 % (ref 11.5–15.5)
WBC: 11.2 10*3/uL — ABNORMAL HIGH (ref 4.0–10.5)

## 2010-10-06 LAB — CARDIAC PANEL(CRET KIN+CKTOT+MB+TROPI)
Relative Index: INVALID (ref 0.0–2.5)
Troponin I: 0.03 ng/mL (ref 0.00–0.06)
Troponin I: 0.04 ng/mL (ref 0.00–0.06)

## 2010-10-06 LAB — GLUCOSE, CAPILLARY
Glucose-Capillary: 121 mg/dL — ABNORMAL HIGH (ref 70–99)
Glucose-Capillary: 155 mg/dL — ABNORMAL HIGH (ref 70–99)
Glucose-Capillary: 189 mg/dL — ABNORMAL HIGH (ref 70–99)
Glucose-Capillary: 218 mg/dL — ABNORMAL HIGH (ref 70–99)
Glucose-Capillary: 258 mg/dL — ABNORMAL HIGH (ref 70–99)
Glucose-Capillary: 271 mg/dL — ABNORMAL HIGH (ref 70–99)

## 2010-10-06 LAB — BASIC METABOLIC PANEL
BUN: 12 mg/dL (ref 6–23)
BUN: 21 mg/dL (ref 6–23)
CO2: 25 mEq/L (ref 19–32)
CO2: 27 mEq/L (ref 19–32)
Calcium: 8.4 mg/dL (ref 8.4–10.5)
Calcium: 8.9 mg/dL (ref 8.4–10.5)
Chloride: 107 mEq/L (ref 96–112)
Creatinine, Ser: 0.58 mg/dL (ref 0.4–1.2)
Creatinine, Ser: 1.22 mg/dL — ABNORMAL HIGH (ref 0.4–1.2)
GFR calc Af Amer: >60 mL/min (ref >60)
GFR calc non Af Amer: 45 mL/min — ABNORMAL LOW (ref >60)
Glucose, Bld: 184 mg/dL — ABNORMAL HIGH (ref 70–99)
Glucose, Bld: 226 mg/dL — ABNORMAL HIGH (ref 70–99)
Potassium: 4 mEq/L (ref 3.5–5.1)
Sodium: 139 mEq/L (ref 135–145)
Sodium: 139 mEq/L (ref 135–145)

## 2010-10-06 LAB — URINE CULTURE

## 2010-10-06 LAB — TSH: TSH: 0.531 u[IU]/mL (ref 0.350–4.500)

## 2010-10-06 LAB — URINALYSIS, ROUTINE W REFLEX MICROSCOPIC
Bilirubin Urine: NEGATIVE
Hgb urine dipstick: NEGATIVE
Nitrite: POSITIVE — AB
Specific Gravity, Urine: 1.021 (ref 1.005–1.030)
Urobilinogen, UA: 0.2 mg/dL (ref 0.0–1.0)
pH: 6 (ref 5.0–8.0)

## 2010-10-06 LAB — PROTIME-INR
INR: 1.93 — ABNORMAL HIGH (ref 0.00–1.49)
INR: 2.54 — ABNORMAL HIGH (ref 0.00–1.49)
INR: 2.74 — ABNORMAL HIGH (ref 0.00–1.49)
Prothrombin Time: 27.1 seconds — ABNORMAL HIGH (ref 11.6–15.2)
Prothrombin Time: 28.8 seconds — ABNORMAL HIGH (ref 11.6–15.2)

## 2010-10-06 LAB — LIPID PANEL: VLDL: 39 mg/dL (ref 0–40)

## 2010-10-06 LAB — POCT CARDIAC MARKERS: Troponin i, poc: <0.05 ng/mL (ref 0.00–0.09)

## 2010-10-06 LAB — DIFFERENTIAL
Basophils Absolute: 0.1 10*3/uL (ref 0.0–0.1)
Eosinophils Relative: 2 % (ref 0–5)
Lymphocytes Relative: 35 % (ref 12–46)
Monocytes Absolute: 1 10*3/uL (ref 0.1–1.0)
Monocytes Relative: 9 % (ref 3–12)

## 2010-10-06 LAB — URINE MICROSCOPIC-ADD ON

## 2010-10-06 LAB — T4, FREE: Free T4: 1.41 ng/dL (ref 0.80–1.80)

## 2010-10-09 LAB — TROPONIN I: Troponin I: 0.04 ng/mL (ref 0.00–0.06)

## 2010-10-09 LAB — CBC
Hemoglobin: 11.4 g/dL — ABNORMAL LOW (ref 12.0–15.0)
Hemoglobin: 12.8 g/dL (ref 12.0–15.0)
MCHC: 32.3 g/dL (ref 30.0–36.0)
MCHC: 33 g/dL (ref 30.0–36.0)
MCV: 82.6 fL (ref 78.0–100.0)
RBC: 4.22 MIL/uL (ref 3.87–5.11)
RBC: 4.7 MIL/uL (ref 3.87–5.11)
RDW: 16.2 % — ABNORMAL HIGH (ref 11.5–15.5)
RDW: 16.5 % — ABNORMAL HIGH (ref 11.5–15.5)

## 2010-10-09 LAB — BASIC METABOLIC PANEL
BUN: 19 mg/dL (ref 6–23)
CO2: 21 mEq/L (ref 19–32)
CO2: 21 mEq/L (ref 19–32)
CO2: 23 mEq/L (ref 19–32)
Calcium: 8.8 mg/dL (ref 8.4–10.5)
Calcium: 9.6 mg/dL (ref 8.4–10.5)
Chloride: 105 mEq/L (ref 96–112)
Chloride: 108 mEq/L (ref 96–112)
Creatinine, Ser: 0.77 mg/dL (ref 0.4–1.2)
GFR calc Af Amer: >60 mL/min (ref >60)
GFR calc non Af Amer: 54 mL/min — ABNORMAL LOW (ref >60)
Glucose, Bld: 112 mg/dL — ABNORMAL HIGH (ref 70–99)
Glucose, Bld: 167 mg/dL — ABNORMAL HIGH (ref 70–99)
Glucose, Bld: 190 mg/dL — ABNORMAL HIGH (ref 70–99)
Potassium: 3.7 mEq/L (ref 3.5–5.1)
Sodium: 134 mEq/L — ABNORMAL LOW (ref 135–145)
Sodium: 138 mEq/L (ref 135–145)

## 2010-10-09 LAB — DIFFERENTIAL
Basophils Absolute: 0.1 10*3/uL (ref 0.0–0.1)
Basophils Absolute: 0.1 10*3/uL (ref 0.0–0.1)
Basophils Relative: 1 % (ref 0–1)
Basophils Relative: 1 % (ref 0–1)
Eosinophils Absolute: 0.1 10*3/uL (ref 0.0–0.7)
Eosinophils Relative: 1 % (ref 0–5)
Lymphocytes Relative: 44 % (ref 12–46)
Monocytes Absolute: 1 10*3/uL (ref 0.1–1.0)
Monocytes Relative: 10 % (ref 3–12)
Neutro Abs: 4.1 10*3/uL (ref 1.7–7.7)
Neutro Abs: 5.6 10*3/uL (ref 1.7–7.7)
Neutrophils Relative %: 45 % (ref 43–77)

## 2010-10-09 LAB — GLUCOSE, CAPILLARY
Glucose-Capillary: 145 mg/dL — ABNORMAL HIGH (ref 70–99)
Glucose-Capillary: 148 mg/dL — ABNORMAL HIGH (ref 70–99)
Glucose-Capillary: 171 mg/dL — ABNORMAL HIGH (ref 70–99)
Glucose-Capillary: 174 mg/dL — ABNORMAL HIGH (ref 70–99)

## 2010-10-09 LAB — CARDIAC PANEL(CRET KIN+CKTOT+MB+TROPI)
CK, MB: 1.5 ng/mL (ref 0.3–4.0)
Relative Index: INVALID (ref 0.0–2.5)
Total CK: 57 U/L (ref 7–177)

## 2010-10-09 LAB — APTT
aPTT: 24 seconds (ref 24–37)
aPTT: 24 seconds (ref 24–37)

## 2010-10-09 LAB — POCT I-STAT, CHEM 8
BUN: 26 mg/dL — ABNORMAL HIGH (ref 6–23)
Calcium, Ion: 1.22 mmol/L (ref 1.12–1.32)
Chloride: 108 mEq/L (ref 96–112)
Creatinine, Ser: 1 mg/dL (ref 0.4–1.2)
Glucose, Bld: 107 mg/dL — ABNORMAL HIGH (ref 70–99)
TCO2: 18 mmol/L (ref 0–100)

## 2010-10-09 LAB — URINALYSIS, ROUTINE W REFLEX MICROSCOPIC
Ketones, ur: 15 mg/dL — AB
Nitrite: NEGATIVE
Protein, ur: 100 mg/dL — AB

## 2010-10-09 LAB — URINE CULTURE: Colony Count: >=100000

## 2010-10-09 LAB — PROTIME-INR
INR: 1.3 (ref 0.00–1.49)
INR: 1.6 — ABNORMAL HIGH (ref 0.00–1.49)

## 2010-10-09 LAB — CK TOTAL AND CKMB (NOT AT ARMC)
CK, MB: 1.8 ng/mL (ref 0.3–4.0)
CK, MB: 2.1 ng/mL (ref 0.3–4.0)
Relative Index: INVALID (ref 0.0–2.5)
Relative Index: INVALID (ref 0.0–2.5)
Total CK: 57 U/L (ref 7–177)
Total CK: 76 U/L (ref 7–177)

## 2010-10-09 LAB — TSH: TSH: 0.358 u[IU]/mL (ref 0.350–4.500)

## 2010-10-09 LAB — POCT CARDIAC MARKERS
CKMB, poc: <1 ng/mL — ABNORMAL LOW (ref 1.0–8.0)
Troponin i, poc: <0.05 ng/mL (ref 0.00–0.09)

## 2010-10-09 LAB — D-DIMER, QUANTITATIVE: D-Dimer, Quant: 0.34 ug/mL-FEU (ref 0.00–0.48)

## 2010-10-18 ENCOUNTER — Encounter: Payer: Self-pay | Admitting: *Deleted

## 2010-10-18 ENCOUNTER — Encounter: Payer: BC Managed Care – PPO | Admitting: *Deleted

## 2010-10-19 LAB — POCT CARDIAC MARKERS
Myoglobin, poc: 47.4 ng/mL (ref 12–200)
Troponin i, poc: <0.05 ng/mL (ref 0.00–0.09)

## 2010-10-19 LAB — CBC
HCT: 35.3 % — ABNORMAL LOW (ref 36.0–46.0)
MCV: 80.3 fL (ref 78.0–100.0)
Platelets: 277 10*3/uL (ref 150–400)
RDW: 16.7 % — ABNORMAL HIGH (ref 11.5–15.5)

## 2010-10-19 LAB — PROTIME-INR: INR: 2.9 — ABNORMAL HIGH (ref 0.00–1.49)

## 2010-10-19 LAB — BASIC METABOLIC PANEL
CO2: 23 mEq/L (ref 19–32)
Calcium: 8.6 mg/dL (ref 8.4–10.5)
Creatinine, Ser: 0.7 mg/dL (ref 0.4–1.2)
GFR calc Af Amer: >60 mL/min (ref >60)
GFR calc non Af Amer: >60 mL/min (ref >60)

## 2010-10-19 LAB — DIFFERENTIAL
Basophils Absolute: 0 10*3/uL (ref 0.0–0.1)
Eosinophils Absolute: 0.1 10*3/uL (ref 0.0–0.7)
Eosinophils Relative: 2 % (ref 0–5)
Lymphocytes Relative: 42 % (ref 12–46)

## 2010-10-19 LAB — BRAIN NATRIURETIC PEPTIDE: Pro B Natriuretic peptide (BNP): <30 pg/mL (ref 0.0–100.0)

## 2010-10-19 LAB — GLUCOSE, CAPILLARY: Glucose-Capillary: 251 mg/dL — ABNORMAL HIGH (ref 70–99)

## 2010-10-19 IMAGING — CR DG CHEST 2V
2 series · 2 of 2 positions shown · non-contrast
Comparison: 03/31/2008

CLINICAL DATA: Chest pain

CHEST - 2 VIEW

[w chest pa]
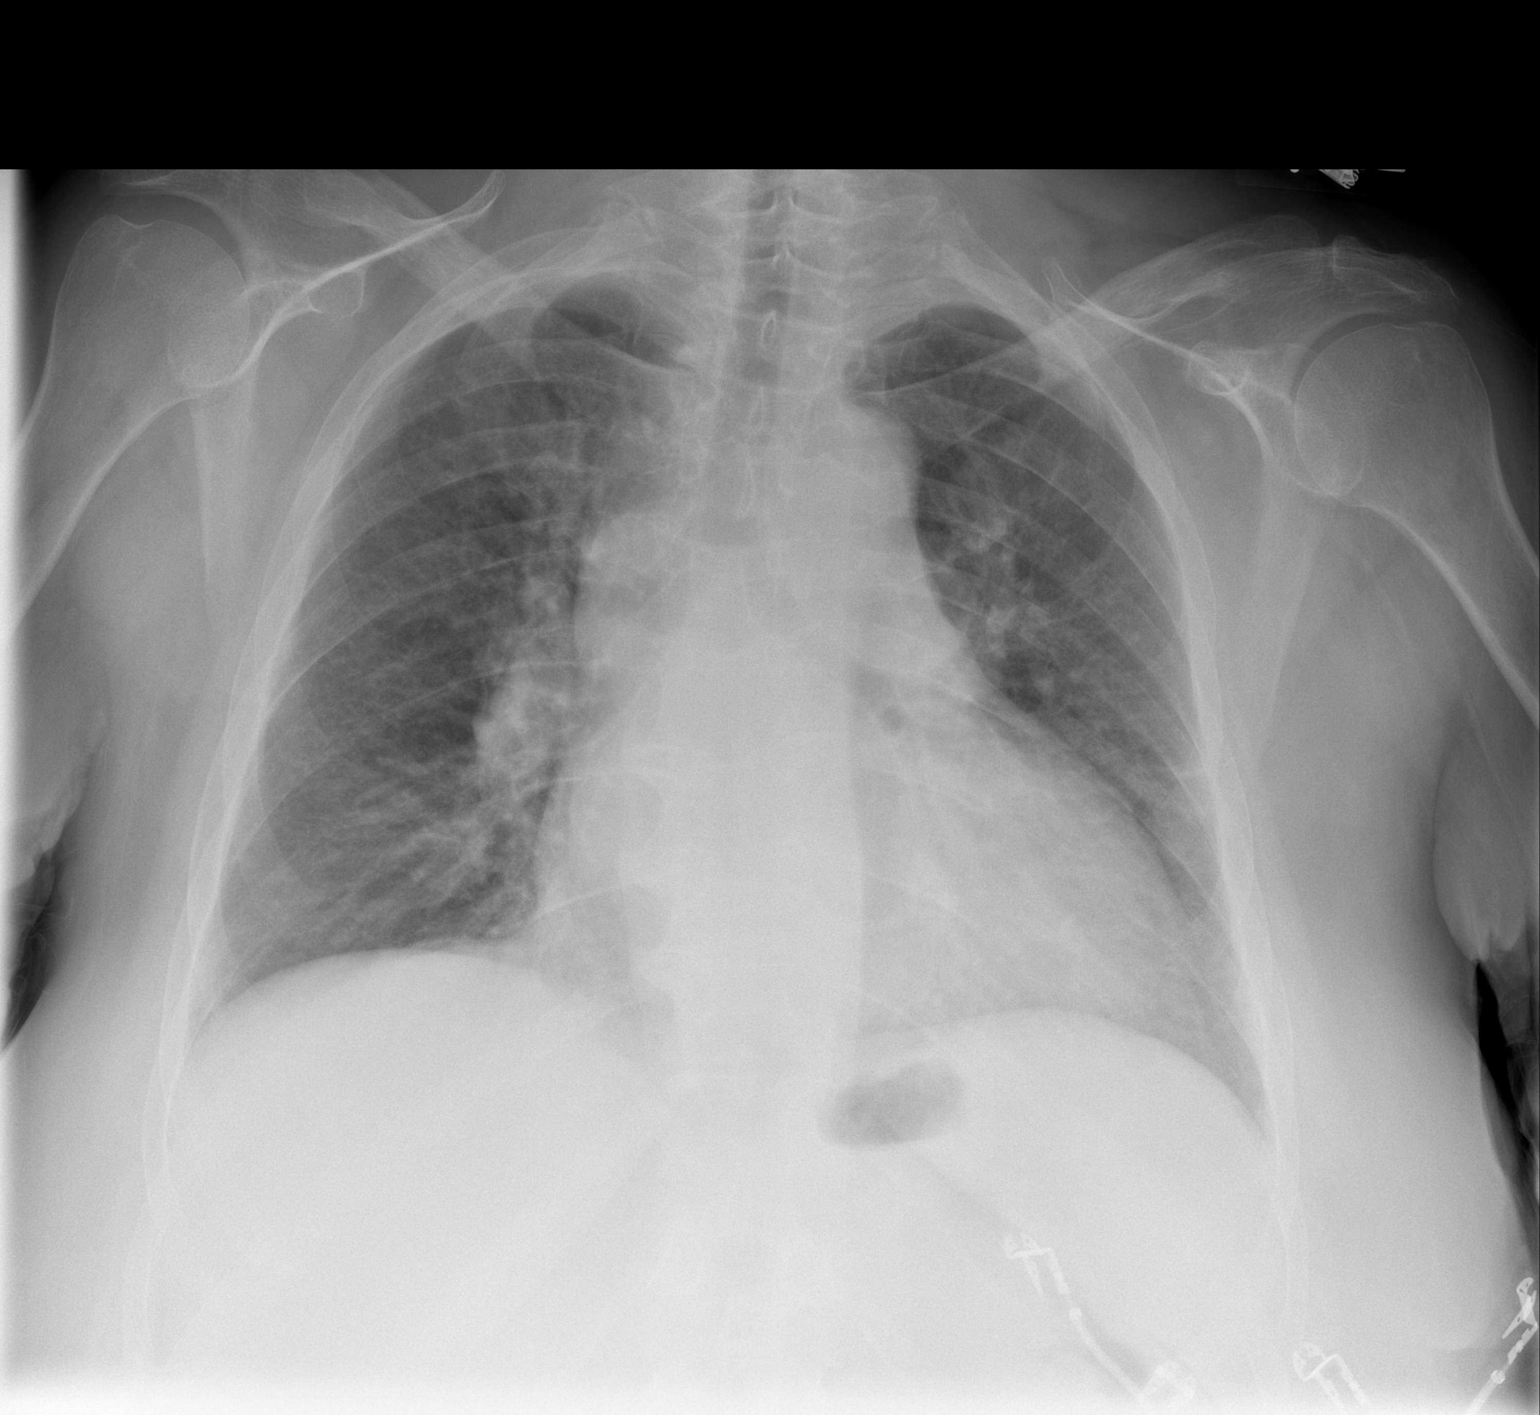

[w chest lat]
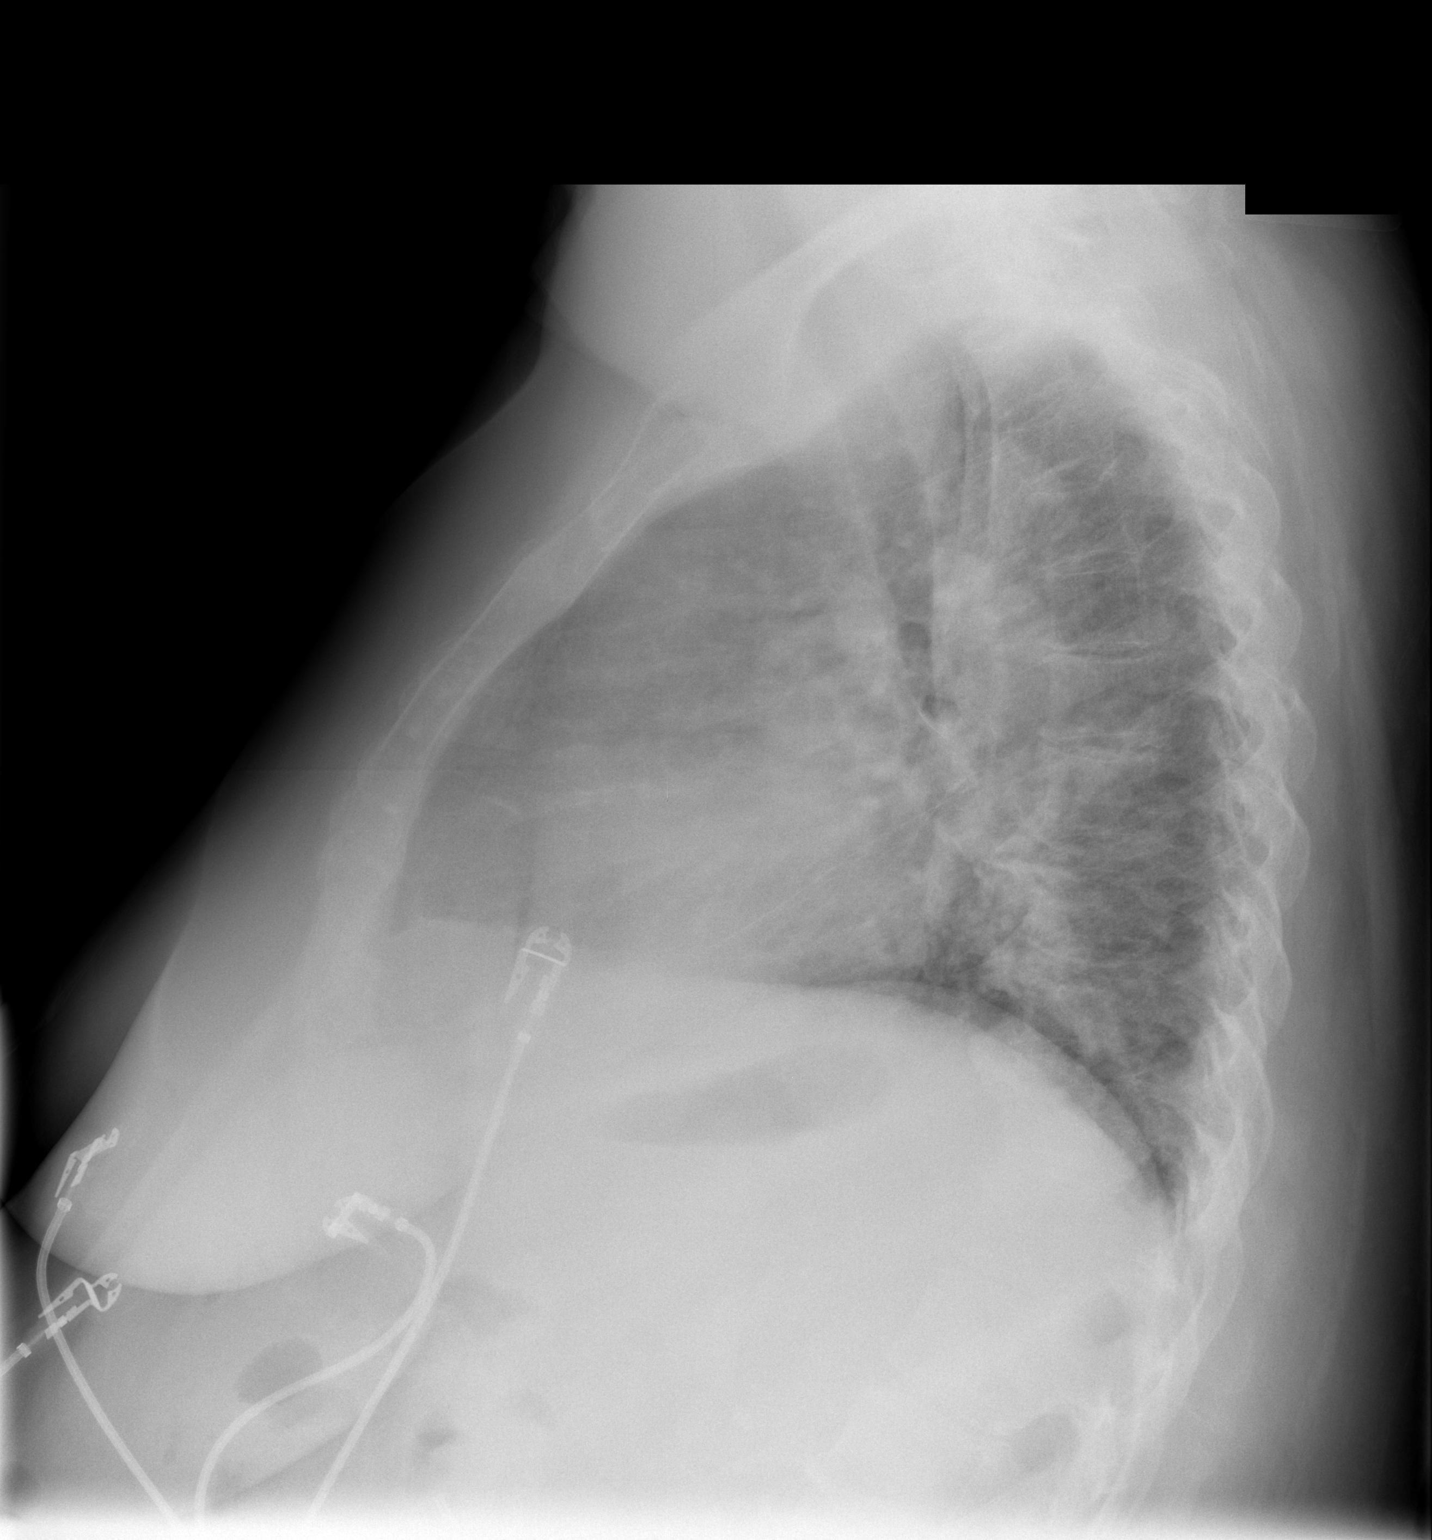

[2 of 2 positions shown; findings below may reference images not displayed]

FINDINGS: Cardiomegaly again noted.  No acute infiltrate or pleural
effusion.  No pulmonary edema.  Degenerative changes thoracic spine
again noted.
IMPRESSION: Cardiomegaly again noted.  No acute infiltrate or edema.

## 2010-10-20 IMAGING — US US ABDOMEN COMPLETE
1 series · 14 of 25 positions shown · non-contrast
Comparison: CT abdomen pelvis 01/12/2007.

CLINICAL DATA: 57-year-old female with right upper quadrant
tenderness and fever.

ABDOMEN ULTRASOUND
TECHNIQUE: Complete abdominal ultrasound examination was performed
including evaluation of the liver, gallbladder, bile ducts,
pancreas, kidneys, spleen, IVC, and abdominal aorta.

[Series 1: unknown · 0.33mm/px · 14 of 51 slices shown]
[im 1/51]
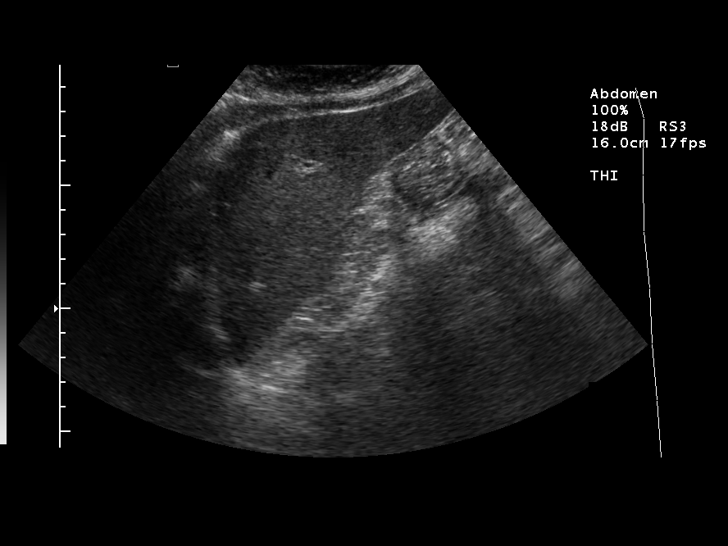
[im 5/51]
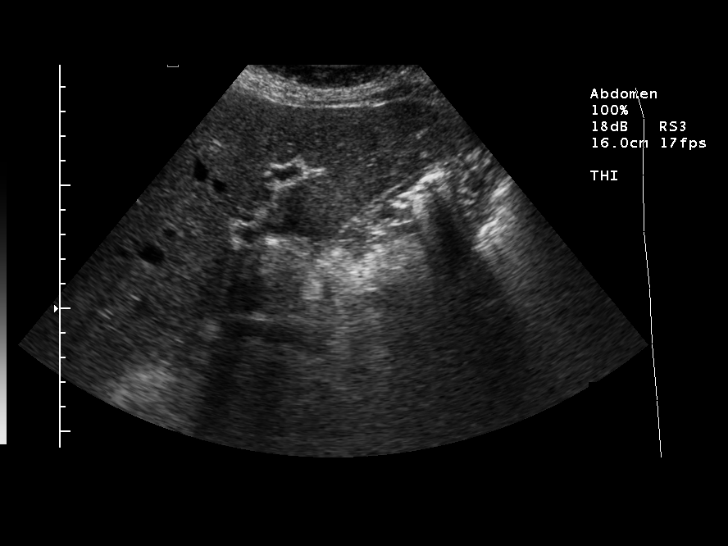
[im 9/51]
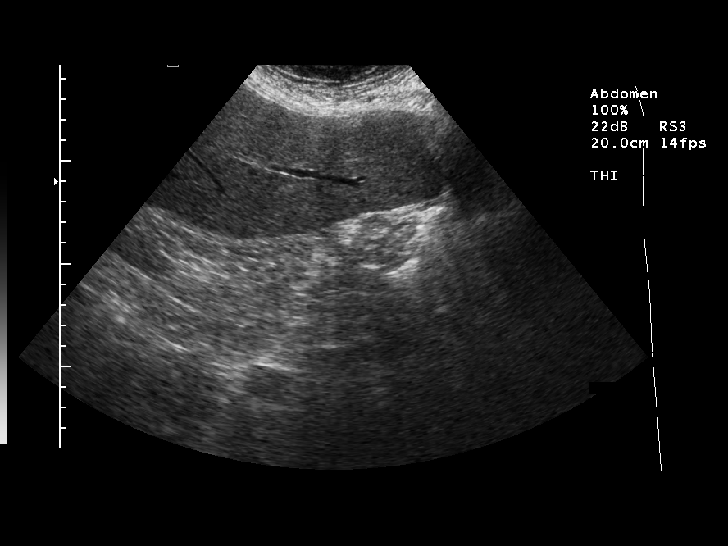
[im 13/51]
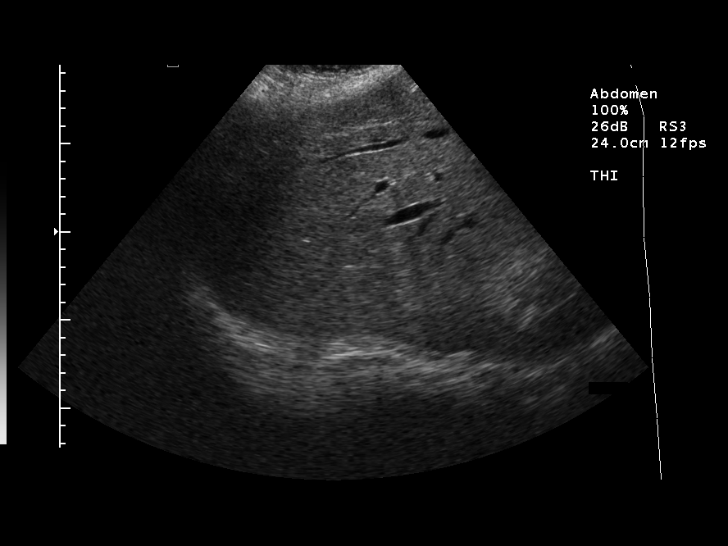
[im 17/51]
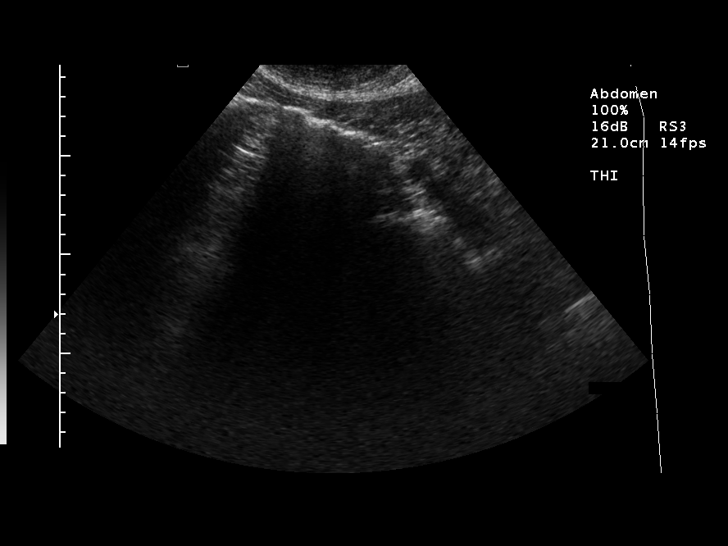
[im 19/51]
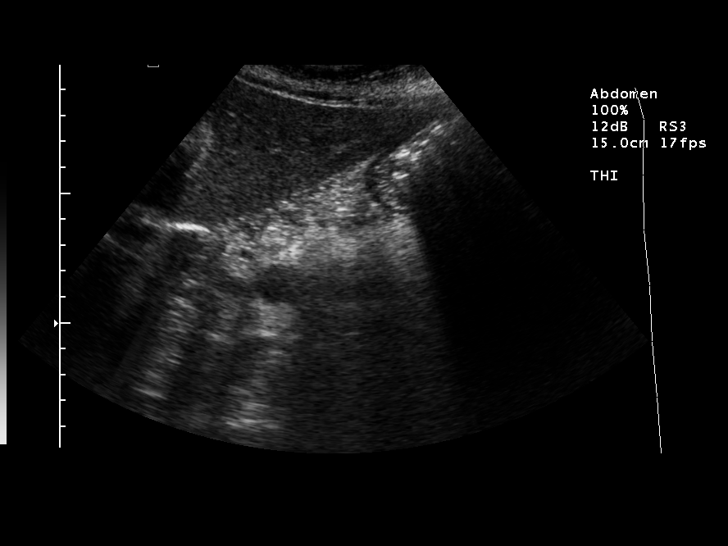
[im 23/51]
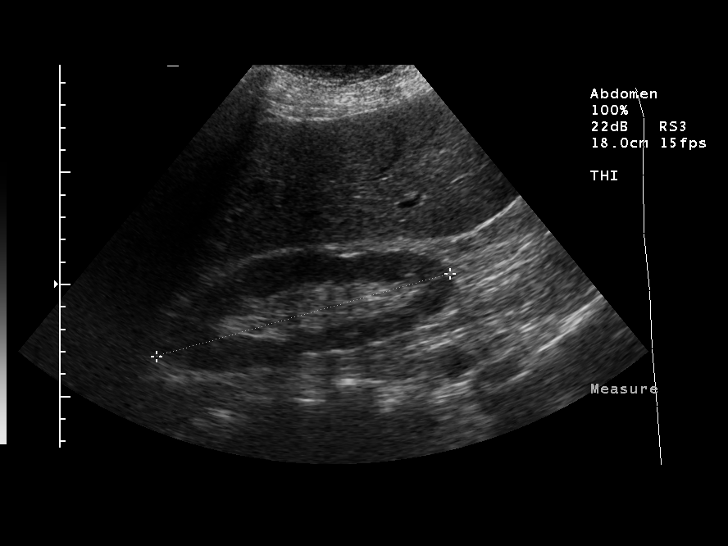
[im 28/51]
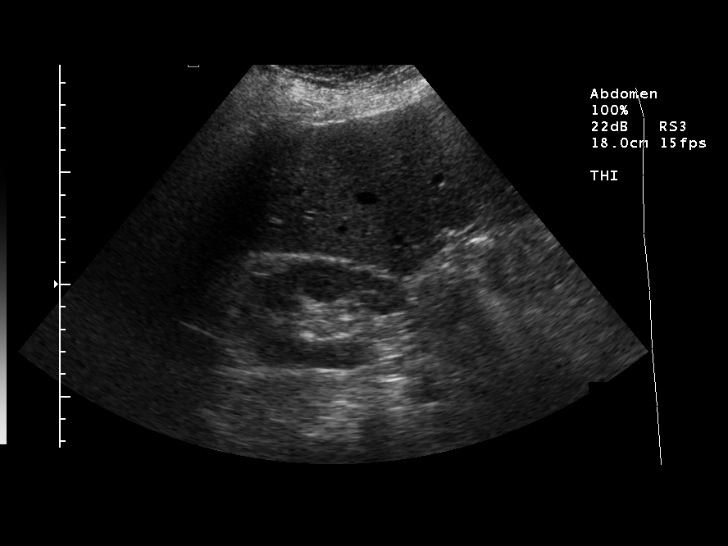
[im 32/51]
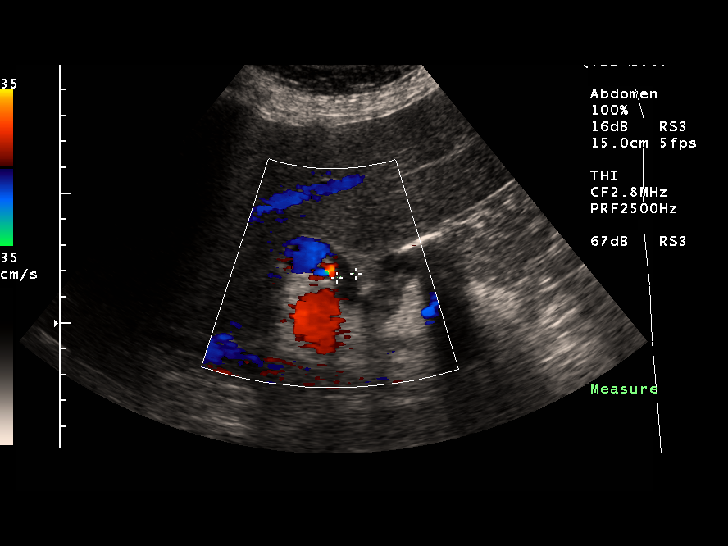
[im 34/51]
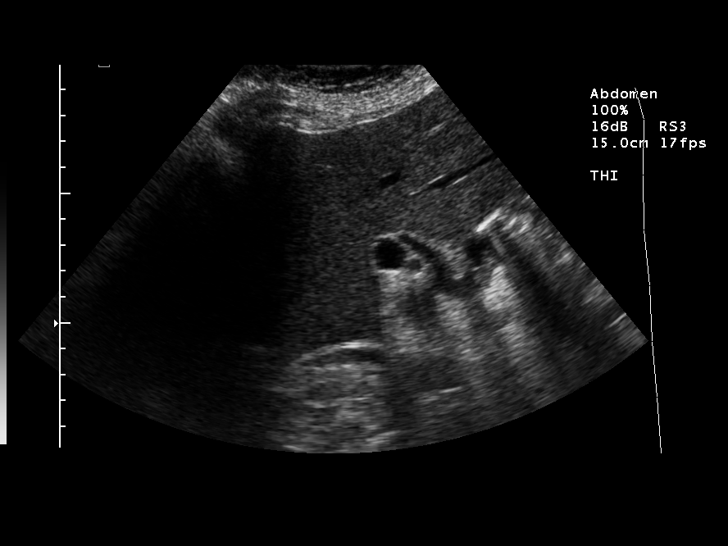
[im 38/51]
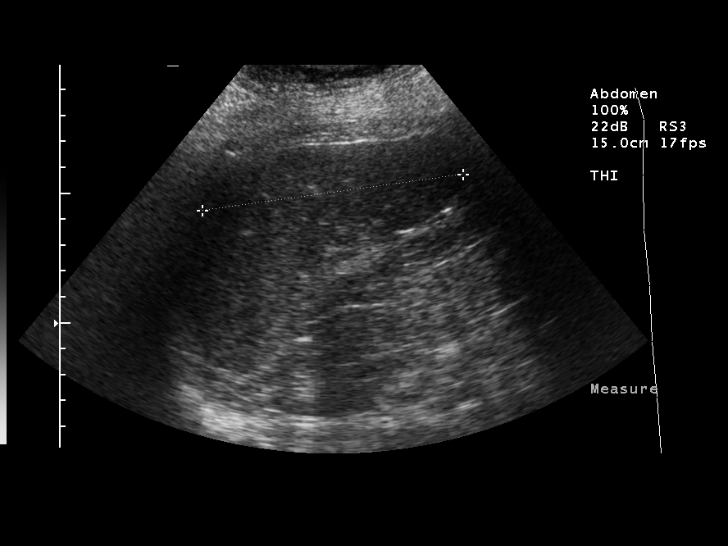
[im 42/51]
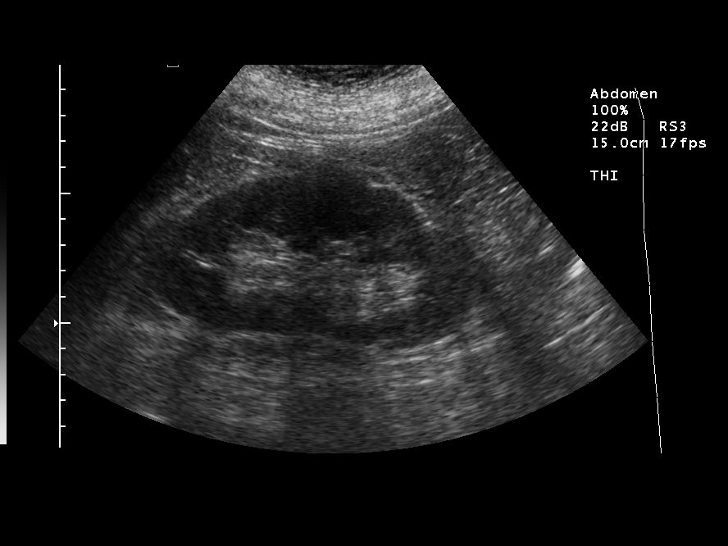
[im 46/51]
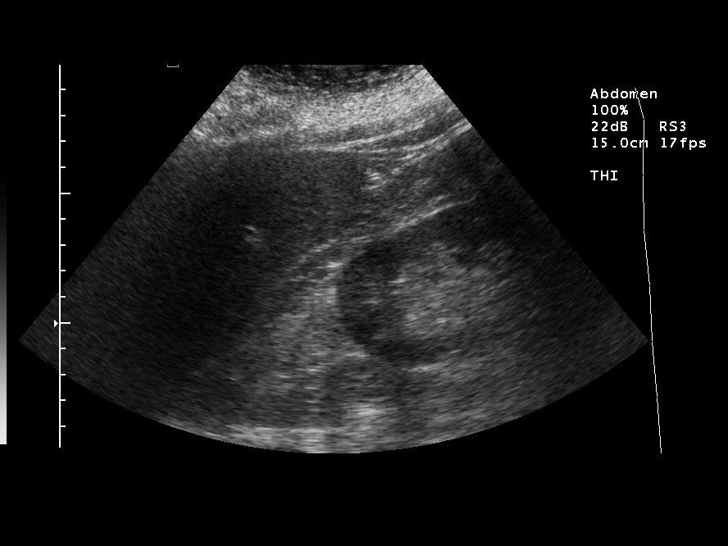
[im 51/51]
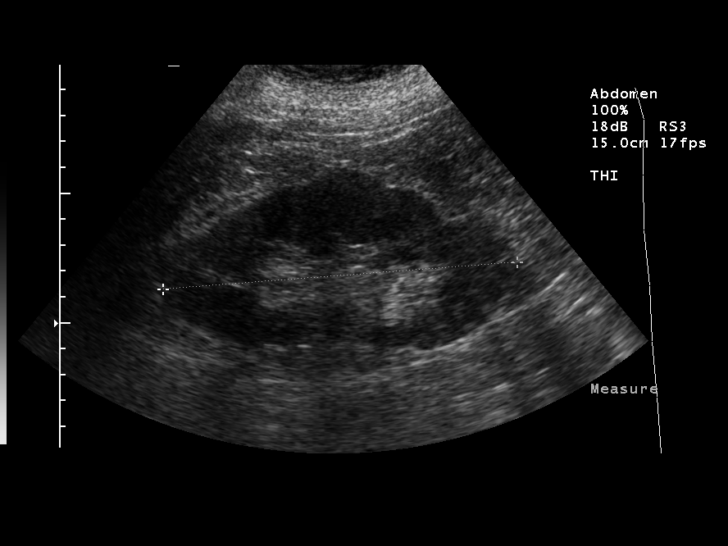

[14 of 25 positions shown; findings below may reference images not displayed]

FINDINGS: Gallbladder is surgically absent, unchanged from prior
CT.  No free fluid identified.  Common bile duct is within normal
limits measuring 7.4 mm.  Liver parenchyma within normal limits.
Pancreas is suboptimally visualized due to overlying bowel gas,
visualized portions are within normal limits.  The spleen is normal
measuring 10 cm in length.  The kidneys are normal measuring
and 13.7 cm on the right and left respectively.  Visualized
inferior vena cava and abdominal aorta are within normal limits;
the distal aorta was obscured by bowel gas and the maximal aortic
diameter identified is 2.3 cm.
IMPRESSION: No acute abdominal findings identified status post cholecystectomy.

## 2010-10-21 ENCOUNTER — Ambulatory Visit (INDEPENDENT_AMBULATORY_CARE_PROVIDER_SITE_OTHER): Payer: BC Managed Care – PPO | Admitting: *Deleted

## 2010-10-21 DIAGNOSIS — I4891 Unspecified atrial fibrillation: Secondary | ICD-10-CM

## 2010-10-21 LAB — POCT INR: INR: 3.7

## 2010-10-21 IMAGING — RF DG ESOPHAGUS
10 of 11 series · 14 of 24 positions shown · non-contrast
Comparison: 12/05/2007 study.

CLINICAL DATA: History given of chest pain, dysphagia, feeling of
food sticking.  Evaluate for esophageal stricture. The patient give
history of previous dilatations of the esophagus for stricture.

ESOPHOGRAM / BARIUM SWALLOW/barium tablet study
TECHNIQUE: Combined double contrast and single contrast
examination performed using effervescent crystals, thick barium
liquid, and thin barium liquid. The patient was also observed
swallowing a 13 mm barium sulfate tablet.
Fluoroscopy time:  1.4 minutes.

[Series 1: run · 1 of 11 slices shown (1 of 10)]
[im 1/11]
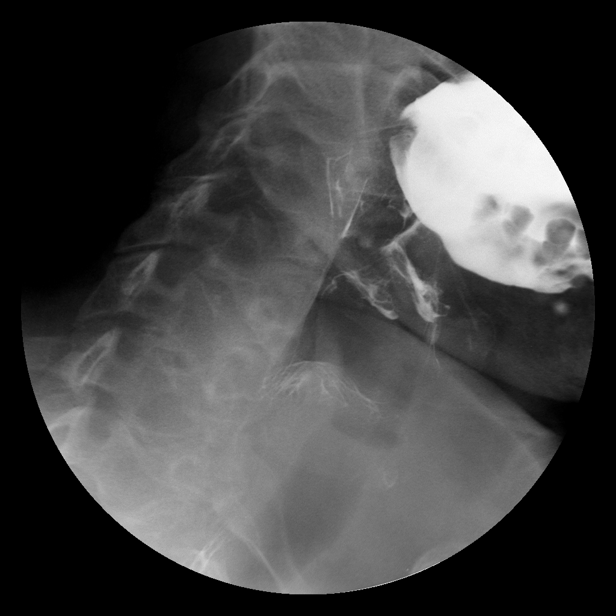

[Series 2: run · 1 of 1 slices shown (2 of 10)]
[im 1/1]
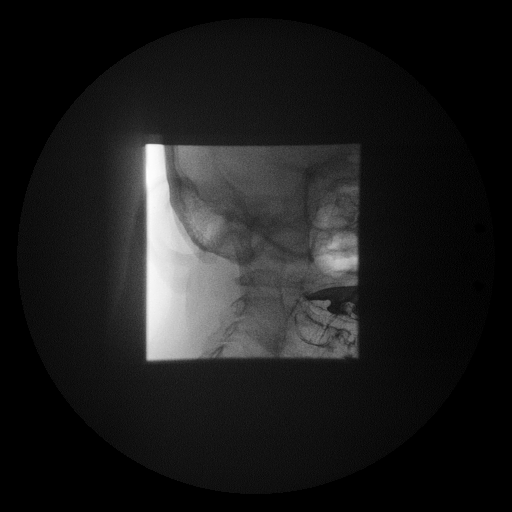

[Series 3: run · 1 of 16 slices shown (3 of 10)]
[im 8/16]
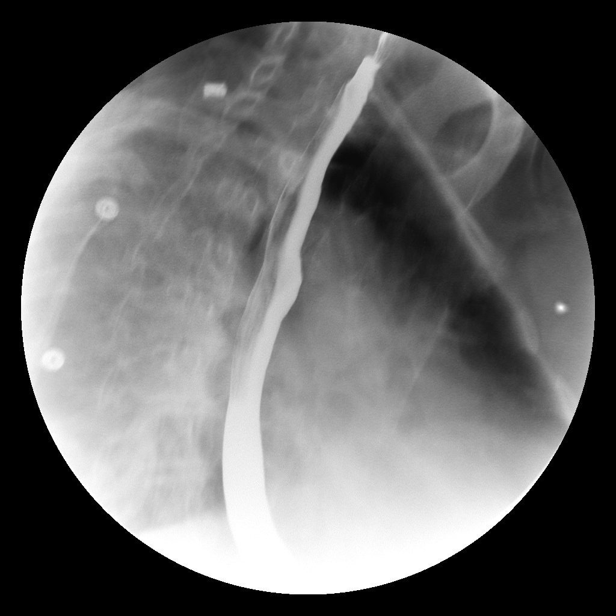

[Series 4: run · 2 of 15 slices shown (4 of 10)]
[im 1/15]
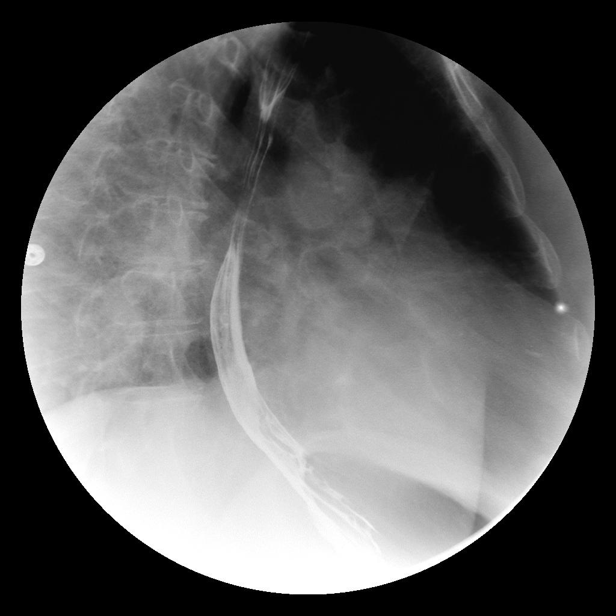
[im 8/15]
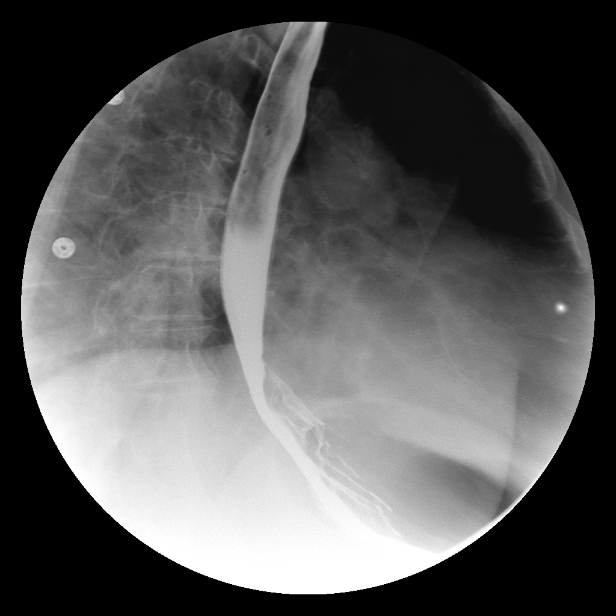

[Series 5: run · 1 of 10 slices shown (5 of 10)]
[im 1/10]
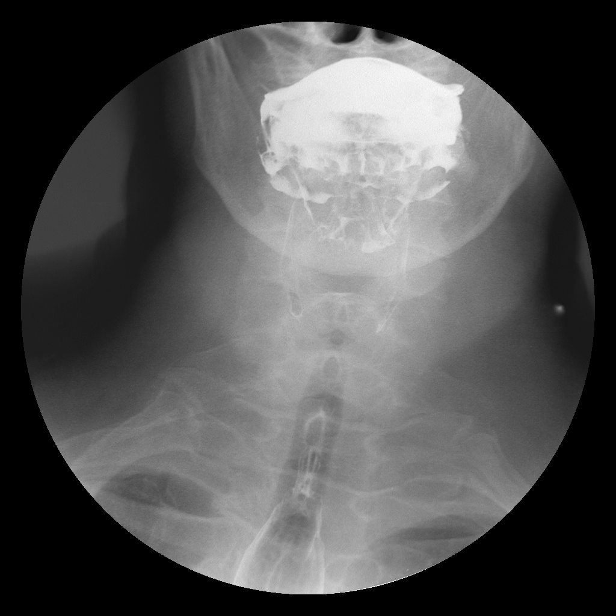

[Series 6: run · 2 of 10 slices shown (6 of 10)]
[im 1/10]
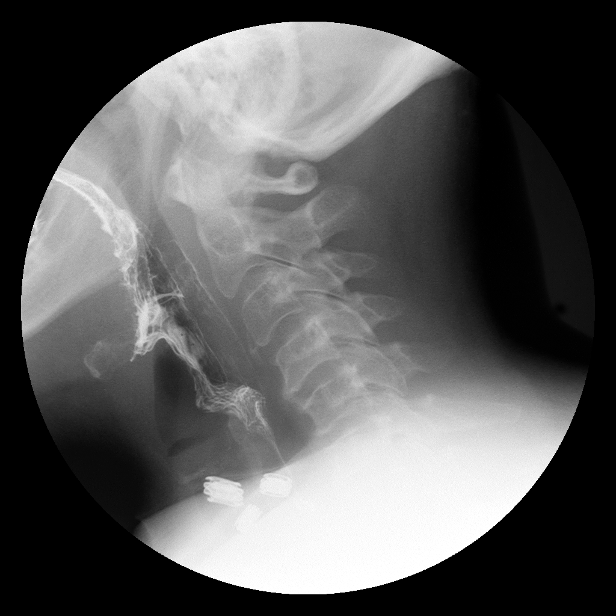
[im 5/10]
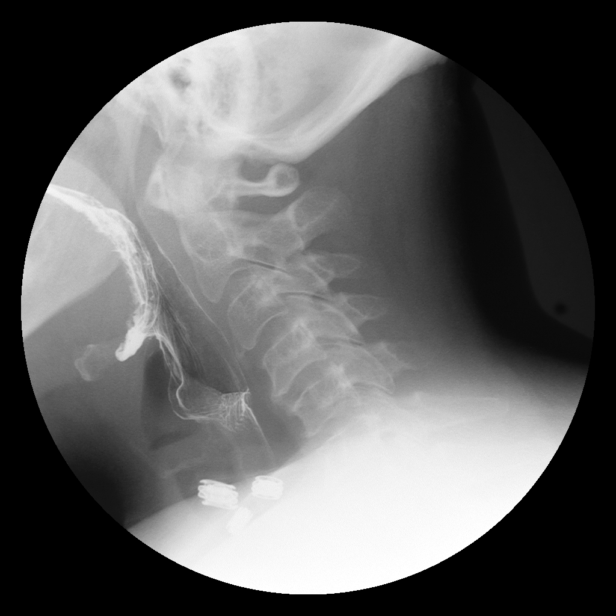

[Series 7: run · 3 of 20 slices shown (7 of 10)]
[im 1/20]
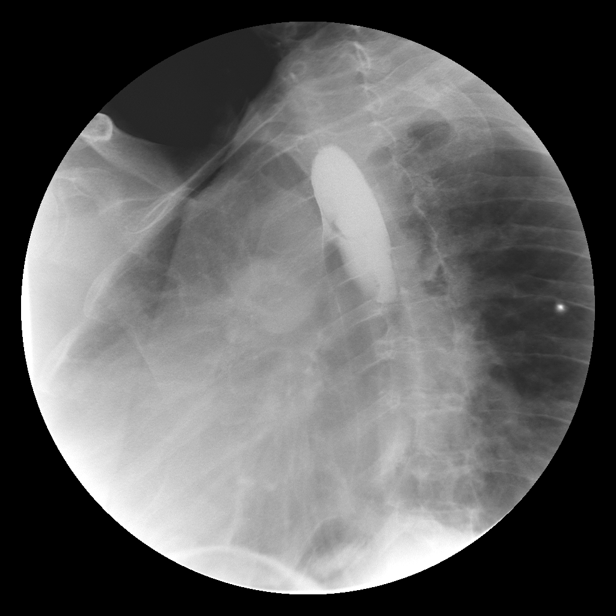
[im 10/20]
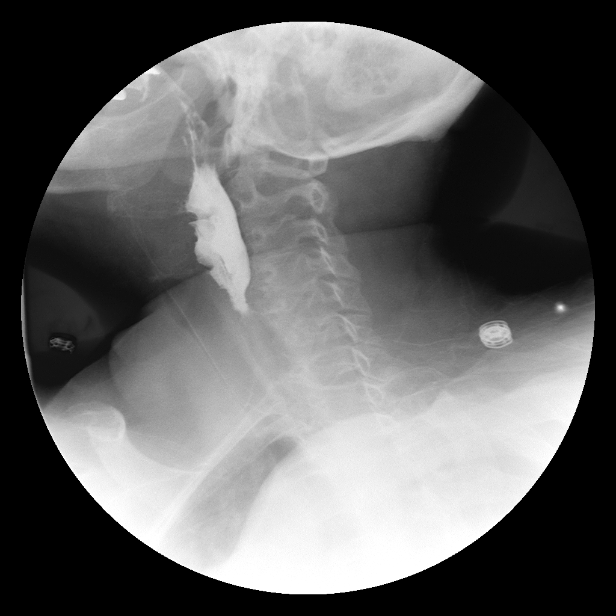
[im 20/20]
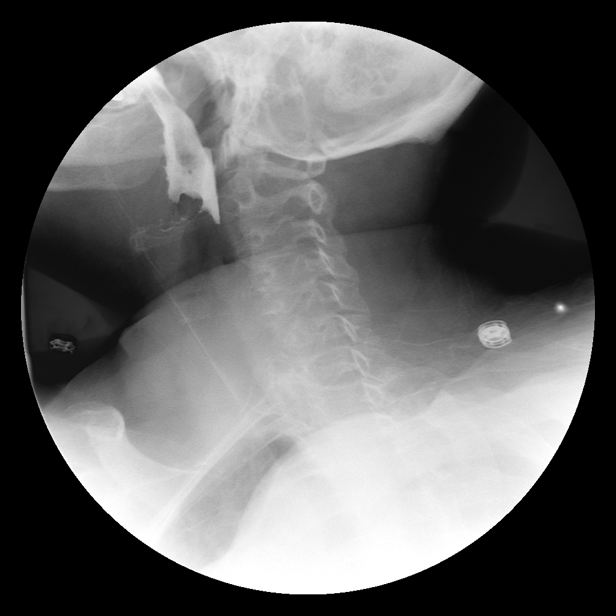

[Series 8: run · 1 of 6 slices shown (8 of 10)]
[im 1/6]
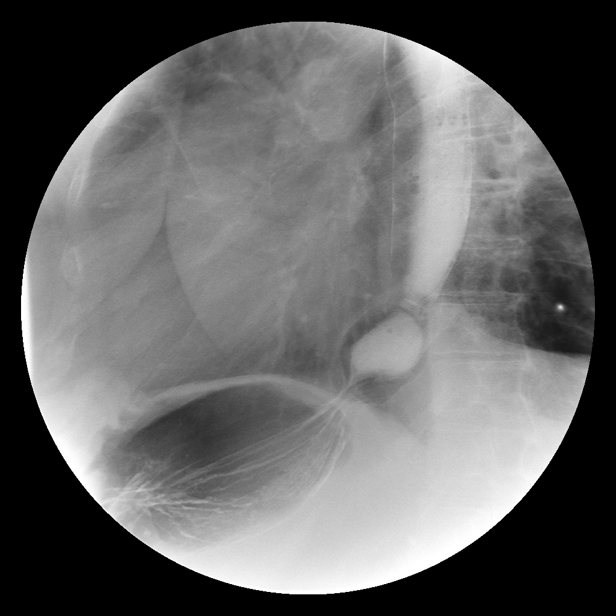

[Series 9: run · 1 of 3 slices shown (9 of 10)]
[im 1/3]
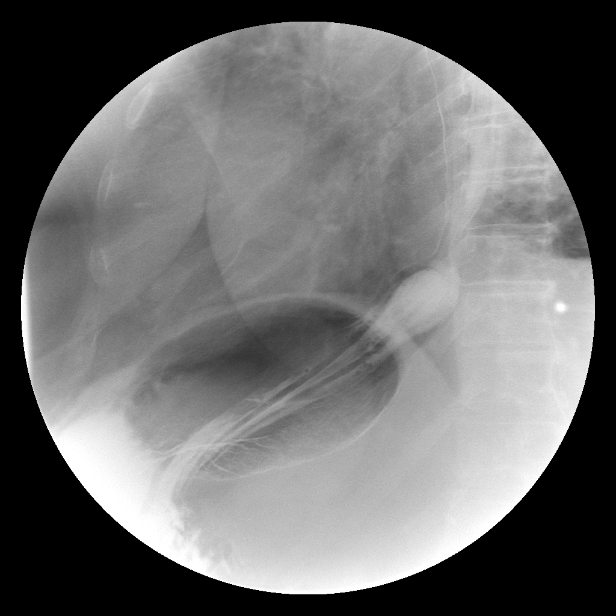

[Series 11: run · 1 of 1 slices shown (10 of 10)]
[im 1/1]
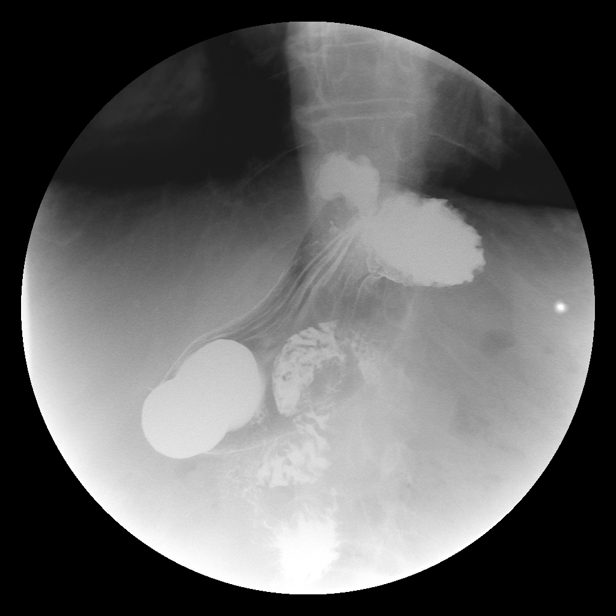

[14 of 24 positions shown; findings below may reference images not displayed]

FINDINGS: The patient had no difficulty initiating swallowing.  No
aspiration occurred.  The hypopharynx appear normal.

There is a prominent cricopharyngeus muscle impression with
swallowing.  No Salahuddin diverticulum is demonstrated.  There is
cervical degenerative spondylosis with anterior osteophyte
formation but the osteophytes do not appear be compromising the
cervical esophagus.

No esophageal mass or obstruction is seen.  There is a small
intermittent sliding type hiatal hernia.  Gastroesophageal reflux
was seen in the supine position to the level of the upper thoracic
esophagus with poor clearing by subsequent peristalsis.  No mucosal
changes of esophagitis were seen.  No motility abnormality is
identified.

When the patient swallowed the 13 mm barium sulfate tablet, it
passed through the esophagus into the stomach with no evidence of
significant narrowing or stricture.
IMPRESSION: There is a prominent cricopharyngeus muscle impression with
swallowing.  No Salahuddin diverticulum is demonstrated.  There is
cervical degenerative spondylosis with anterior osteophyte
formation but the osteophytes do not appear be compromising the
cervical esophagus.

There is a small intermittent sliding type hiatal hernia.
Gastroesophageal reflux was seen in the supine position to the
level of the upper thoracic esophagus with poor clearing by
subsequent peristalsis.  No mucosal changes of esophagitis were
seen.

No stricture or obstruction was evident.  Barium sulfate  tablet
study was normal.

## 2010-11-11 ENCOUNTER — Ambulatory Visit (INDEPENDENT_AMBULATORY_CARE_PROVIDER_SITE_OTHER): Payer: BC Managed Care – PPO | Admitting: *Deleted

## 2010-11-11 DIAGNOSIS — I4891 Unspecified atrial fibrillation: Secondary | ICD-10-CM

## 2010-11-16 NOTE — Letter (Signed)
July 21, 2008    [name], MD  [address]  [place], [place] [address]   RE:  KAYRA, CROWELL  MRN:  [id number]  /  DOB:  1950/07/13   Dear Brett Canales,   Ms. Hoban returns to the office for continued assessment and treatment  of congestive heart failure with preserved left ventricular systolic  function, hypertension, dyslipidemia, and paroxysmal atrial  fibrillation.  Since her last visit, she was admitted to [facility] for 3 days for upper respiratory symptoms.  She was ultimately  thought to possibly have influenza and was treated with Tamiflu.  She  has recovered completely.  She has been relatively inactive in recent  weeks.  She is working full days, generally without problems.   Medications are unchanged from her last visit.  She is taking her  furosemide about every other day.   PHYSICAL EXAMINATION:  GENERAL:  Pleasant overweight woman in no acute  distress.  VITAL SIGNS:  The weight is 275, 11 pounds more than in November, blood  pressure 135/75, heart rate 85 and regular, respirations 12 and  unlabored.  NECK:  No jugular venous distention.  Normal carotid upstrokes without  bruits.  LUNGS:  Clear.  CARDIAC:  Normal first and second heart sounds.  Grade 2/6 basilar  systolic murmur.  ABDOMEN:  Obese; otherwise benign.  EXTREMITIES:  No edema.   Laboratories obtained in December while she was in hospital were notable  for a mild transaminase elevation with negative hepatitis serologies,  normal renal function, mild to moderate anemia with hemoglobin of 10.0,  hematocrit of 30.2, and a normal MCV.  Lipid profile was suboptimal with  total cholesterol of 203, triglycerides of 172, HDL 40, and LDL of 129.   IMPRESSION:  Ms. Heft is doing well from a cardiac standpoint.  Blood  pressure and congestive heart failure are well controlled with a fairly  modest medical regime.  Diabetic control has been more difficult.  She  is scheduled to see you  in the near future for additional attention to  this on her other medical problems.  Her GI problems have improved, but  not resolved with Reglan.   We will repeat her liver function tests as well electrolytes, CBC, and  fasting lipid profile.  She would likely benefit from treatment with a  statin drug.  We will obtain stool for hemoccult testing and check  ferritin and TIBC.  I will plan to reassess this nice woman in 2 months.    Sincerely,      [name]. Dietrich Pates, MD, [facility]  Electronically Signed    RMR/MedQ  DD: 07/21/2008  DT: 07/22/2008  Job #: [id number]   CC:    Lionel December, M.D.

## 2010-11-16 NOTE — Letter (Signed)
May 09, 2008    [name], MD  [address] [place], [place] [address]   RE:  KAYIN, KETTERING  MRN:  [id number]  /  DOB:  1950-08-17   Dear Brett Canales:   Ms. Sunriver returns to the office for continued assessment and treatment  of hypertension, congestive heart failure with normal left ventricular  systolic function, and a recent episode of dehydration related to  persistent nausea.  She continues to have some GI problems, but these  are improved.  She has been evaluated by Dr. Cira Servant, who plans a gastric  emptying study.  A recent chemistry profile was normal, except for a  glucose of 291 suggesting that hydration is adequate.  Amylase and  lipase were normal.   CURRENT MEDICATIONS:  1. Omeprazole 20 mg b.i.d.  2. Sliding scale insulin.  3. Levothyroxine 0.175 mg daily.  4. Lorazepam 1 mg t.i.d.  5. Warfarin as directed with stable and therapeutic anticoagulation.  6. Ondansetron 4 mg daily.  7. Lantus insulin 30 units q.p.m.   PHYSICAL EXAMINATION:  GENERAL:  A pleasant well-appearing woman.  VITAL SIGNS:  The weight is 269, 8 pounds more than 1 week ago.  Blood  pressure 120/80 without orthostatic change, heart rate 80 and regular,  and respirations 14.  NECK:  No jugular venous distention; no carotid bruits.  LUNGS:  Clear.  CARDIAC:  Normal first and second heart sounds; minimal early systolic  ejection murmur.  ABDOMEN:  Soft and nontender; no masses; no organomegaly.  EXTREMITIES:  No edema.   IMPRESSION:  Ms. Roig has improved with discontinuation of her  antihypertensive medications and diuretic.  She was hospitalized in  September with pulmonary edema.  This may have been due to atrial  fibrillation with a rapid ventricular response, but it is also possible  that diastolic congestive heart failure caused her atrial fibrillation.  We will restart furosemide a dose of 20 mg daily.  She will adjust the  dose based upon her weight, trying to maintain its  current level.  She  will resume metformin since renal function is now normal.  She appears  well enough to return to work.  We will check a chemistry profile in 2  weeks and plan a return visit in 1 month.  It is nice to see Naraya doing  better.    Sincerely,      [name]. Dietrich Pates, MD, [facility]  Electronically Signed    RMR/MedQ  DD: 05/09/2008  DT: 05/10/2008  Job #: [id number]

## 2010-11-16 NOTE — Assessment & Plan Note (Signed)
[facility]                       [place] CARDIOLOGY OFFICE NOTE   [name], [name]                        MRN:          [id number]  DATE:10/09/2008                            DOB:          1951-01-23    CARDIOLOGIST:  Tara Friends. Dietrich Pates, MD, [facility]   PRIMARY CARE PHYSICIAN:  Tara Homer. [name], MD   REASON FOR VISIT:  Routine followup.   HISTORY OF PRESENT ILLNESS:  [name] is a 60 year old female patient  with a history of diastolic congestive heart failure as well as  paroxysmal atrial fibrillation who returns to the office today for  followup.  She was last seen in January 2010.  At that time, Dr.  Dietrich [name] set the patient up for repeat blood work secondary to mild  normocytic anemia and dyslipidemia.  She was also taken off of  metoprolol and Cardizem several months ago in the setting of  hypotension.  She did have a brief visit to the emergency room in  February 2010 with palpitations.  She was in sinus rhythm at that time.  She has been stable since that time with respect to palpitations.  She  denies any chest discomfort.  She does note some increasing shortness of  breath with exertion.  Unfortunately, she does have significant problems  with cervical and lumbar degenerative disk disease.  She has decreased  her activity significantly secondary to this.  She describes probable  NYHA class III symptoms.  She denies orthopnea or PND or significant  pedal edema.  She denies any weight increase.  She denies any syncope.  She is seeing [name] in [place] for her back.  She thinks that  she may ultimately require surgery for her lumbar spine.   CURRENT MEDICATIONS:  Omeprazole 20 mg daily, Sliding scale insulin  q.a.c. and nightly, Synthroid 0.175 mg daily, Lorazepam 1 mg 2 times a  day, Warfarin as directed, Altace 10 mg a day,  Reglan 2 mg 4 times a  day, Lantus 50 units nightly, Lasix p.r.n., Meclizine p.r.n., Zofran  p.r.n.,  Ibuprofen p.r.n., Lomotil p.r.n.   ALLERGIES:  HYDROCODONE and CODEINE.   SOCIAL HISTORY:  Please see HPI.   She denies fevers or cough.  She underwent colonoscopy in 2007 with  apparent negative findings.  She sees [name] for gastroparesis.  Her  symptoms are stable in regards to this.  She is due for a screening  colonoscopy in 2012.   PHYSICAL EXAMINATION:  GENERAL:  She is a well-nourished well-developed  female in no acute distress.  NECK:  Blood pressure is 110/68, pulse 96, weight 275 pounds, this is  unchanged from a last visit.  HEENT:  Normal.  NECK:  Without JVD.  CARDIAC:  Normal S1, S2.  Regular rate and rhythm.  No S3.  LUNGS:  Clear to auscultation bilaterally.  No rales or wheezing.  ABDOMEN:  Soft, nontender.  EXTREMITIES:  With trace ankle edema bilaterally.  SKIN:  Warm and dry.  NEUROLOGIC:  She is alert and oriented x3.  Cranial nerves II through  XII grossly intact.   LABORATORY DATA:  From July 26, 2008, hemoglobin 10.9, MCV 83.3.  Potassium 4.2, creatinine 0.66, LFTs okay, iron low normal at 45,  ferritin low at 8, total cholesterol 209, triglycerides 282, HDL 40, LDL  113.   ASSESSMENT AND PLAN:  1. Chronic diastolic congestive heart failure with overall preserved      left ventricular function with an ejection fraction of 55-65% in      the past.  From a volume standpoint, she appears to be optivolemic.      However, she has had more shortness of breath with exertion.  I      think this is probably multifactorial and related mainly to      deconditioning from her back problems.  We will check some blood      work today and have a BNP obtained with that.  If this is      significantly elevated, we can try to increase her diuretic and      assess for symptom relief.  2. Normocytic anemia.  She does have low iron stores as evidenced by      her low ferritin.  She is taking a multivitamin with iron in it.      We will recheck a CBC to reassess her  hemoglobin.  I will send a      copy of this on to Dr. Sudie [name] and [name], and defer further      workup of her anemia to them.  3. Dyslipidemia.  With her diabetes mellitus, she would benefit from      statin therapy.  She has taken Lipitor in the past.  We will place      her on Lipitor 20 mg a day and recheck lipids and LFTs in 12 weeks.  4. Paroxysmal atrial fibrillation.  She is doing well off of beta-      blocker and calcium channel blocker therapy.  She is remaining in      normal sinus rhythm by exam today.  She continues on Coumadin.  She      had an elevated INR today and her dose of Coumadin has been      adjusted.  5. Cervical and lumbar degenerative disk disease.  She has been      advised to contact our office should she need surgery in the      future, so we can be involved in clearing her for surgery.   DISPOSITION:  She will be brought back in followup with Dr. Dietrich [name] in  3 months or sooner p.r.n.      [name], [name]  Electronically Signed      Jesse Sans. Daleen Squibb, MD, [facility]  Electronically Signed   SW/MedQ  DD: 10/09/2008  DT: 10/10/2008  Job #: [id number]   cc:   [name], M.D.  Kassie Mends, M.D.

## 2010-11-16 NOTE — Discharge Summary (Signed)
[name], [name] NO.:  [id number]   MEDICAL RECORD NO.:  [id number]          PATIENT TYPE:  INP   LOCATION:  2034                         FACILITY:  [facility]   PHYSICIAN:  Tara Friends. Tara Pates, [name], FACCDATE OF BIRTH:  1950-09-08   DATE OF ADMISSION:  03/23/2008  DATE OF DISCHARGE:  03/30/2008                               DISCHARGE SUMMARY   PRIMARY CARDIOLOGIST:  Tara Friends. Tara Pates, [name], [name]   PRIMARY CARE PHYSICIAN:  [name]   PROCEDURES PERFORMED DURING HOSPITALIZATION:  None   FINAL DISCHARGE DIAGNOSES:  1. Atrial fibrillation with rapid ventricular response.  2. Acute on chronic diastolic congestive heart failure.  3. Nonobstructive coronary artery disease.  A:  Cardiac catheterization in 2002 showing luminal irregularities.  B.  Myoview done April in 2008 showed breast attenuation artifact with  no evidence of ischemia.  1. Diabetes.  2. Hypertension.  3. Hypercholesterolemia.  4. Obesity.  5. Obstructive sleep apnea.  6. Hypothyroidism.  7. Iron deficiency anemia.  8. Hiatal hernia.  9. Gastroesophageal reflux disease.   HOSPITAL COURSE:  This is a 60 year old female patient with history of  diabetes, hypertension, diastolic heart failure who presented with fluid  overload and rapid atrial fibrillation.  The patient felt nauseated and  said to have what she described as flu-like symptoms.  She developed  significant orthopnea and had complaints of some chest discomfort,  although she had no radiation.  She was seen in the emergency department  and found to be in atrial fibrillation with rapid ventricular response  with a heart rate up to 140s and significant volume overload.  The  patient was also hypertensive on admission with a blood pressure of  198/101.  The patient was started on a Cardizem drip and a nitroglycerin  drip.  She was given IV diuresis.  The patient admitted to a 20-pound  weight gain over the last several days  prior to admission.   The patient was seen and examined by Dr. Marca [name] on admission and  admitted for IV diuresis as stated above.  The patient's diltiazem drip  was increased to 10 mg IV to assist in heart rate control.  The patient  was monitored throughout hospitalization for response to treatment.  The  patient subsequently had a TEE cardioversion on March 24, 2008,  which was successful and the patient returned to normal sinus rhythm.  The patient had significant improvement in symptoms of dyspnea and  better diuresis post cardioversion.  Cardizem was changed to p.o. and  she was followed.  The patient was found to have some mild  hypomagnesemia with a magnesium level of 1.8 and was given replacement  along with replacement of potassium after diuresis.  The patient was  also started on Lopressor 25 mg t.i.d. and Cardizem drip was changed to  p.o. Cardizem at 120 mg daily.  The patient remained on heparin until  INR was therapeutic.  On day of discharge, the patient was seen and  examined by Dr. McNair [name].  The patient's INR was 1.8.  She will  follow up in the Coumadin Clinic in [place] in 2 days and will go  home on Coumadin 7.5 mg daily until that time.  The patient's Lasix had  changed in dose from 40 mg a day to 60 mg daily on discharge as well.  The patient remained in normal sinus rhythm and had significant  improvement in her breathing status at discharge.  On day of discharge,  the patient weighed 120.8 kg, which is down from 123.6 kg on discharge.   DISCHARGE LABORATORY DATA:  PT 27.2.  INR 2.3.  CBC, hemoglobin 11.8,  hematocrit 35.8, white blood cells 11.0, and platelets 310.  BNP 118.  Sodium 139, potassium 3.6, chloride 98, CO2 29, glucose 332, BUN 12, and  creatinine 0.80.  Chest x-ray dated March 30, 2008 revealing  improving CHF.   DISCHARGE VITAL SIGNS:  Blood pressure 110/70, heart rate 70,  respirations 20, and the patient's weight is  120.3 kg.   DISCHARGE MEDICATIONS:  1. Lasix 60 mg daily (increased dose from 40 mg daily).  2. Metoprolol 50 mg twice a day.  3. Cardizem 120 mg twice a day.  4. Coumadin 7.5 mg daily depending upon Coumadin Clinic PT/INR.  5. Metformin 100 mg daily.  6. Omeprazole 20 mg twice a day.  7. Lantus 50 units every night.  8. Glipizide 10 mg daily.  9. Ramipril 10 mg daily.  10.Aspirin 81 mg daily.  11.Synthroid 0.17 mg daily.   ALLERGIES:  To CODEINE.   FOLLOWUP PLANS AND APPOINTMENTS:  1. The patient will be seen by Dr. Crane [name] in the office in 5      days in [place].  2. The patient will follow up with the Coumadin Clinic in [place]      in 2 days.  3. The patient will have a BMET drawn on day of appointment with Dr.      Dietrich [name].  4. The patient has been given new prescriptions for Cardizem,      metoprolol, and Coumadin.  5. The patient has been advised to weigh herself daily and report any      weight gain to Dr. Dietrich [name] on followup appointments.      Tara Mare. Lyman Bishop, NP      Tara Friends. Tara Pates, [name], [facility]  Electronically Signed    KML/MEDQ  D:  03/30/2008  T:  03/30/2008  Job:  [id number]   cc:   [name], M.D.

## 2010-11-16 NOTE — Assessment & Plan Note (Signed)
NAMEMarland [name]  [name], Tara                  [name]#:  [id number]   DATE:  05/08/2008                       DOB:  09/24/1950   REFERRING PHYSICIAN:  [name]. Dietrich Pates, MD, [facility]   PRIMARY PHYSICIAN:  [name], MD.   PROBLEM LIST:  1. Persistent nausea and vomiting.  2. Morbid obesity, workup pending for gastric Lap-Band surgery.  3. Fatty infiltration of the liver on CT scan in July 2008.  4. Hiatal hernia.  5. Short-segment Barrett's and gastroesophageal reflux disease on      esophageal biopsy in May 2008.  6. History of 56-French Maloney dilation in May 2008.  7. Irritable bowel syndrome with constipation and diarrhea.  8. Diabetes for 10 years.  9. Cholecystectomy.  10.Hysterectomy.   SUBJECTIVE:  The patient is a 60 year old female who presents as a  return patient visit.  She was last seen by Dr. [name] in May 2008.  She  was hospitalized for CHF exacerbation and new-onset atrial fib in  [place], which she was discharged from the hospital, she began to  have vomiting.  She says her medicine would not stay down.  She has been  on omeprazole and it stay down.  She is on omeprazole for 8-9 years.  She denies any heartburn problems, swallowing, or abdominal pain.  She  does have bloating at nighttime and her stomach is hard as a rock.  She has 2 bowel movements a day.  She has had 12-pound intentional  weight loss.  The Reglan was stopped in the past for an unknown reason.   FAMILY HISTORY:  She has no family history of colon cancer or colon  polyps.   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  She is married and has 2 kids.  She  occasionally drinks alcohol.  She is a nonsmoker.   ALLERGIES:  Duragesic.   MEDICATIONS:  1. Omeprazole 20 mg b.i.d.  2. Lantus 30 units daily.  3. Glipizide 10 mg daily.  4. Humulin sliding scale.  5. Synthroid.  6. Nitroglycerin p.r.n.  7. Ativan as needed.  8. Zofran 4  mg daily.  9. Coumadin 5 mg daily.   OBJECTIVE/PHYSICAL EXAM:  VITAL SIGNS:  Weight 274 pounds (down 12  pounds since 2008), height 5 feet 6 inches, temperature 98.6, blood  pressure 132/80, and pulse 76.GENERAL:  She is in no apparent distress.  Alert and oriented x4.HEENT:  Atraumatic and normocephalic.  Pupils  equal and react to light.  Mouth, no oral lesions.  Posterior pharynx  without erythema or exudate.NECK:  Has full range of motion.  No  lymphadenopathy.CARDIOVASCULAR:  Regular rhythm.  Unable to appreciate a  murmur.LUNGS:  Clear to auscultation bilaterally.ABDOMEN:  Bowel sounds  present, soft, nontender, nondistended.  No rebound or  guarding.EXTREMITIES:  Trace edema bilaterally.  No cyanosis.NEUROLOGIC:  She has no focal neurologic deficits.   ASSESSMENT:  The patient is a 60 year old female who has both persistent  nausea and vomiting.  She likely has gastroparesis due to diabetes.  She  also has gastroesophageal reflux disease, which is being treated with  omeprazole b.i.d.  Thank you for allowing me to see the patient in  consultation.  My recommendations follow.   RECOMMENDATIONS:  1.  She should continue to take the Zofran as needed for nausea and      vomiting.  2. She is given a gastroparesis diet and explained which diet she      should follow and how to titrate the diet according to her      symptoms.  3. Screening colonoscopy in 2012.  4. Will schedule a gastric emptying study to evaluate for      gastroparesis.  She has gastroparesis.  This may complicate her      pursuit of Lap-Band surgery.  5. Follow up in 2 months.   RADS:  11/09-GES: delayed emptying-retained 99% at 2 hours       Kassie Mends, M.D.  Electronically Signed     SM/MEDQ  D:  05/08/2008  T:  05/08/2008  Job:  [id number]   cc:   Dr. Marcy Salvo. Dietrich Pates, MD, [facility]

## 2010-11-16 NOTE — Discharge Summary (Signed)
[name], [name]                 ACCOUNT NO.:  [id number]   MEDICAL RECORD NO.:  [id number]          PATIENT TYPE:  INP   LOCATION:  5504                         FACILITY:  [facility]   PHYSICIAN:  Tara Riffle, MD, FACCDATE OF BIRTH:  [birth year]   DATE OF ADMISSION:  02/11/2009  DATE OF DISCHARGE:  02/13/2009                               DISCHARGE SUMMARY   PROCEDURES:  Two-view chest x-ray.   PRIMARY FINAL DISCHARGE DIAGNOSIS:  Orthostatic hypotension felt  secondary to dehydration.   SECONDARY DIAGNOSES:  1. Paroxysmal atrial fibrillation.  2. Hypertension.  3. Hyperlipidemia.  4. Obstructive sleep apnea, not on continuous positive airway      pressure.  5. Iron deficiency anemia.  6. Gastroesophageal reflux disease/hiatal hernia.  7. Hypothyroidism.  8. Status post hysterectomy, cholecystectomy, EGD, dilatation, back      surgery, carpal tunnel surgery, and cardiac catheterization.  9. History of patent foramen ovale by transesophageal echocardiography      in September 2009.  10.Preserved left ventricular function with an EF of   Dictation ended at this point.      Tara Demark, [name]      Tara Riffle, MD, [facility]     RB/MEDQ  D:  02/13/2009  T:  02/13/2009  Job:  [id number]

## 2010-11-16 NOTE — Assessment & Plan Note (Signed)
[facility]                       [place] [facility] OFFICE NOTE   ULA, COUVILLON                        MRN:          [id number]  DATE:04/02/2008                            DOB:          1951/01/22    CARDIOLOGIST:  [name]. Dietrich Pates, MD, [facility]   PRIMARY CARE PHYSICIAN:  [name], MD   REASON FOR VISIT:  Post hospitalization and post ER followup.   HISTORY OF PRESENT ILLNESS:  [name] is a 60 year old female patient  with a history of diabetes, hypertension, diastolic heart failure who  was recently admitted to [facility] for a 7 day stay with new  onset atrial fibrillation with rapid ventricular rate and congestive  heart failure.  Her echocardiogram in the past had revealed good LV  function.  She underwent transesophageal echocardiogram guided  cardioversion during her admission.  This revealed that her LV function  continued to be normal.  She did have a patent foramen ovale noted.  There was no left atrial appendage thrombus and she was successfully  cardioverted.  She was eventually discharged to home after her  congestive heart failure was compensated.  She presented back to the  emergency room a couple of days ago with concerns that she was back in  atrial fibrillation.  She was noted to actually be in sinus bradycardia  with a heart rate in the mid 50s.  More than anything, she feels more  anxious about her new diagnosis.  Her breathing is overall improved.  She denies orthopnea or PND.  Her pedal edema is much improved.  She  denies significant exertional shortness of breath.  She describes NYHA  class II to IIB symptoms.  She denies chest pain.  She denies syncope or  near syncope.  She does note quite a bit of nausea and vomiting.  She  has been on Reglan in the past; I suspect for diabetic gastroparesis.  She apparently had some reaction to this medication in the past.  Since  she has been home from the  hospital, she has been unable to keep any  food or liquids down for the most part.  She thinks that she has also  lost some of her medications to emesis as well.   MEDICATIONS:  Metformin 1 gram b.i.d., Omeprazole 20 mg b.i.d., Lantus  50 units q.h.s., Glipizide 10 mg daily, Altace 10 mg daily, Sliding  scale insulin, Cymbalta daily,  Aspirin 81 mg daily, Synthroid 0.17 mg daily, Skelaxin 800 mg three  times a day, Lorazepam 1 mg three times a day, Lasix 40 mg 1 and 1/2  tablets daily,  Warfarin as directed, Metoprolol 50 mg b.i.d., Cardizem CD 120 mg daily.   ALLERGIES:  CODEINE.   PHYSICAL EXAMINATION:  GENERAL:  She is a well-nourished, well-developed  female.  VITAL SIGNS:  Blood pressure is 126/88, pulse 71, weight 255 pounds.  HEENT:  Normal.  NECK:  Neck without JVD.  LYMPHS:  Without lymphadenopathy.  CARDIAC:  Normal S1, S2, regular rate and rhythm.  LUNGS:  Clear to auscultation bilaterally.  ABDOMEN:  Soft and  nontender.  EXTREMITIES:  With trace edema bilaterally.  Multiple varicosities  noted.  NEUROLOGIC:  She is alert and oriented x3.  Cranial nerves II-XII  grossly intact.   Electrocardiogram reveals sinus rhythm with heart rate of 71, left axis  deviation, interventricular conduction delay, rare PVC, nonspecific ST-T  wave changes.   ASSESSMENT AND PLAN:  1. Atrial fibrillation with rapid ventricular response status post      transesophageal echocardiogram guided cardioversion resulting in      normal sinus rhythm.  She is maintaining sinus rhythm.  I am      concerned that she may be missing some of her medications secondary      to nausea and vomiting.  Overall she is stable and no medication      changes were made today.  2. Acute on chronic diastolic congestive heart failure in the setting      of #1.  Overall she is euvolemic on exam.  Will make no changes in      her diuretic therapy.  3. Nausea and vomiting.  I suspect this is related to diabetic       gastroparesis.  I have searched through all of her medications and      she cannot take Compazine or Phenergan secondary to possibility of      QT prolongation with Cymbalta.  She has had a reaction to Reglan in      the past.  I recommend that she takes Zofran 4 mg 1 tablet every 8-      12 hours p.r.n.  I have given her a prescription for #10 no      refills.  I have asked her to follow up with Dr. [name] within      the next week for further recommendations regarding this.  4. Minimal coronary plaquing by cardiac catheterization in 2002 and a      nonischemic Myoview in 2008.  She is complaining of no chest pain      and needs no workup at this time.  5. Hypertension.  This is overall well-controlled.  No medication      changes will be made today.  6. Diabetes mellitus.  As noted above she is to follow up with Dr.      [name].  7. Hypothyroidism.  She should follow up with Dr. [name].   DISPOSITION:  The patient will be brought back in followup in 1 week  with the nurse to recheck her blood pressure and heart rate.  I elected  to do this so that we can get an idea of what her heart rate and blood  pressure are doing when she is not suffering from nausea and vomiting.  I will also bring her back in followup with Dr. Dietrich Pates in the next 3  weeks or sooner p.r.n.      Tereso Newcomer, PA-C  Electronically Signed      Jonelle Sidle, MD  Electronically Signed   SW/MedQ  DD: 04/02/2008  DT: 04/02/2008  Job #: [id number]   cc:   [name], M.D.

## 2010-11-16 NOTE — Letter (Signed)
May 23, 2008    [name], M.D.  [address] [place], [place] [address]   RE:  [name], [name]  MRN:  [id number]  /  DOB:  [birth year]   Dear Jeannett Senior:   Ms. Dossantos returns to the office for continued assessment and treatment  of a history of congestive heart failure with preserved left ventricular  systolic function and more recent dehydration.  We have tried to balance  her diuretic dose to control both processes.  She has been diagnosed  with gastroparesis, and treatment initiated with better control of  nausea and emesis.  She is taking Lasix 20 mg daily, holding it  occasionally if she has to go out during the day.  She has had no more  dizziness.  She has worked most days since her last visit except for 1  day when GI symptoms precluded her from leaving the house.   MEDICATIONS:  Her medications are unchanged except for the addition of  Reglan 10 mg q.i.d.   PHYSICAL EXAMINATION:  GENERAL:  Pleasant woman in no acute distress.  VITAL SIGNS:  The weight is 264, 5 pounds less than 2 weeks ago.  Blood  pressure 135/85, heart rate 75 and regular, and respirations 14.  NECK:  No jugular venous distention.  LUNGS:  Clear.  CARDIAC:  Distant first and second heart sounds; fourth heart sound  present.  ABDOMEN:  Soft and nontender; no organomegaly.  EXTREMITIES:  No edema.   Chemistry profile is pending.  A recent lipid profile obtained a [facility] showed elevated triglycerides, which has been the case in  the past.   IMPRESSION:  Ms. Merkin is doing better with her current medical regime,  which will be continued.   PLAN:  I will plan to reassess this nice woman in 2 months.    Sincerely,      [name]. Dietrich Pates, MD, [facility]  Electronically Signed    RMR/MedQ  DD: 05/23/2008  DT: 05/24/2008  Job #: [id number]

## 2010-11-16 NOTE — Op Note (Signed)
[name], [name]                 ACCOUNT NO.:  [id number]   MEDICAL RECORD NO.:  [id number]          PATIENT TYPE:  AMB   LOCATION:  DAY                           FACILITY:  [facility]   PHYSICIAN:  Kassie Mends, M.D.      DATE OF BIRTH:  09/03/1950   DATE OF PROCEDURE:  10/02/2008  DATE OF DISCHARGE:  10/02/2008                               OPERATIVE REPORT   REFERRING Keyuna Cuthrell:  [name], MD   PROCEDURE:  Hydrogen breath test to evaluate for small bowel bacterial  overgrowth.   INDICATION FOR EXAM:  [name] is a 60 year old female with diabetes  and diarrhea.  She complains of bloating, diarrhea, and gas.   PRE-PROCEDURE EVALUATION:  She had no beans, bran, or high fiber  yesterday.  She had been fasting since 3 p.m.  She denied any smoking,  sleep, or rigorous exercise 30 minutes prior to the procedure.  She had  no recent antibiotic use.   CORRECTION TEST SUGAR:  Lactulose 25 g.  The evaluation began at 7:45  and ended at 10:30.   FINDINGS:  The initial breathalyzer measured 4 parts per million.  She  had a peak of 22 at 122 minutes.  No other values exceeded 22 prior to 2  hours.   DIAGNOSIS:  Hydrogen breath test, which does not show evidence of small  bowel bacterial overgrowth.   RECOMMENDATIONS:  [name] has diarrhea which is likely secondary to  irritable bowel, diabetic enteropathy, and/or bile salt-induced  diarrhea.  I spoke with her and her diarrhea has now resolved.  She can  continue her low-fat diabetic diet and avoid dairy products.  She may  continue to use over-the-counter diarrheal medication as needed.      Kassie Mends, M.D.  Electronically Signed     SM/MEDQ  D:  10/09/2008  T:  10/09/2008  Job:  [id number]   cc:   [name], M.D.  Fax: [phone]

## 2010-11-16 NOTE — Letter (Signed)
April 24, 2008    [name]. [name], MD  [address]  [place], [place] [address]   RE:  [name], [name]  MRN:  [id number]  /  DOB:  25-Oct-1950   Dr. Brett Canales,   Ms. Resch returns to the office for continued assessment and treatment  of paroxysmal atrial fibrillation.  Since cardioversion in the hospital  in September, she has done fairly well.  She has had episodic nausea and  emesis, most recently last night.  Ondansetron provide some benefit, but  does not totally relieve her symptoms.  Blood pressure control has been  good.  She has noted some palpitations, but these were assessed on one  occasion and did not represent atrial fibrillation.  She has  progressively lost weight in recent years.  She has noted no edema nor  weight gain.   Medications are unchanged from her last visit except for the addition of  ondansetron 4 mg p.r.n.   PHYSICAL EXAMINATION:  GENERAL:  Overweight pleasant woman in no acute  distress.  VITAL SIGNS:  The weight is 267, 12 pounds more than September, but 15  pounds less than 2008 and more than 35 pounds less than in 2003.  Blood  pressure 110/70, heart rate 65 and regular, respirations 14.  HEENT:  Anicteric sclerae; normal oral mucosa.  NECK:  No jugular venous distention.  LUNGS:  Clear.  CARDIAC:  Normal first and second heart sounds; minimal early systolic  murmur.  ABDOMEN:  Soft and nontender; no organomegaly.  EXTREMITIES:  Trace edema.   INR was therapeutic 3 days ago.   IMPRESSION:  [name] is doing fairly well following an episode of  atrial fibrillation.  Recurrence likely at some point.  She is  encouraged to continue losing weight.  She has been evaluated by Dr.  Dionicia Abler in the past for gastrointestinal symptoms.  I suggested that she  allow him to reassess her current problems.  I have given her permission  to return to work next week and we will see this nice woman again in 2  months.  Aspirin will be discontinued since she  is now on warfarin.    Sincerely,      [name]. Dietrich Pates, MD, [facility]  Electronically Signed    RMR/MedQ  DD: 04/24/2008  DT: 04/25/2008  Job #: [id number]

## 2010-11-16 NOTE — Procedures (Signed)
NAMEKANNA, DAFOE                 ACCOUNT NO.:  [id number]   MEDICAL RECORD NO.:  [id number]          PATIENT TYPE:  OUT   LOCATION:  SLEE                          FACILITY:  [facility]   PHYSICIAN:  Kofi A. Gerilyn Pilgrim, M.D. DATE OF BIRTH:  1950-09-02   DATE OF PROCEDURE:  DATE OF DISCHARGE:  12/07/2007                             SLEEP DISORDER REPORT   NOCTURNAL POLYSOMNOGRAPHY REPORT   REFERRING PHYSICIAN:  Molli Hazard B. Daphine Deutscher, MD   INDICATION:  This is a 60 year old lady who presents with hypersomnia  and snoring.  She has been evaluated for obstructive sleep apnea  syndrome.   BMI 46.  Epworth sleepiness scale 14.   MEDICATIONS:  Metformin, omeprazole, aspirin, Skelaxin, Altace, Ativan,  Synthroid, and Allegra.   SLEEP STAGE SUMMARY:  The total recording time is 450 minutes.  Sleep  efficiency 61%.  Sleep latency 96 minutes.  REM latency 0.  Stage 1 was  10%, stage 2 was 83%, slow-wave sleep 6%, and REM sleep 0.   RESPIRATORY SUMMARY:  The baseline oxygen saturation is 98% with the  lowest saturation 85%.  AHI 27.  She had 114 events and they were all  hypopneic events.   LIMB MOVEMENT SUMMARY:  PLM index 0.   ELECTROCARDIOGRAM SUMMARY:  Average heart rate 74 with isolated PVCs  observed.   IMPRESSION:  Moderate obstructive sleep apnea syndrome.   RECOMMENDATIONS:  Formal CPAP titration study.      Kofi A. Gerilyn Pilgrim, M.D.  Electronically Signed     KAD/MEDQ  D:  12/08/2007  T:  12/09/2007  Job:  [id number]   cc:   [place] Daphine Deutscher, MD  [address], [address]  [place]  [place] [address]

## 2010-11-16 NOTE — Letter (Signed)
May 01, 2008    [name], MD  [address]  [place], [place] [address]   RE:  JENILYN, MAGANA  MRN:  [id number]  /  DOB:  1951/02/21   Dear Brett Canales:   Ms. Hettich returns to the office after a trip to the emergency  department 2 days ago and after seeing you yesterday.  As you know, she  developed hypotension.  All of her cardiac medications were stopped as  well as her diuretic.  She continues to experience nausea with some  emesis and difficulty taking her medications because of this.  She has  not had any additional lightheadedness.  She suffered only one episode  of dizziness, which was likely due to hypotension.   Current medications include:  1. Metformin 1000 mg b.i.d.  2. Omeprazole 20 mg b.i.d.  3. Glipizide 10 mg daily.  4. Sliding scale insulin.  5. Cymbalta 60 mg daily.  6. Levothyroxine 0.175 mg daily.  7. Skelaxin.  8. Lorazepam p.r.n.  9. Warfarin as directed with a therapeutic INR on April 29, 2008.  10.Ondansetron 4 mg p.r.n.  11.She also was given a prescription for meclizine, but has not      started it.   PHYSICAL EXAMINATION:  GENERAL:  Washed-out-appearing woman in no acute  distress.  VITAL SIGNS:  The weight is 261, 6 pounds less than a week ago.  Blood  pressure 130/75 without orthostatic change, heart rate 90 and regular,  respirations 14.  NECK:  No jugular venous distention.  LUNGS:  Clear.  CARDIAC:  Normal first and second heart sounds; minimal systolic murmur.  ABDOMEN:  Soft and nontender; no masses; no organomegaly.  EXTREMITIES:  No edema.   IMPRESSION:  Ms. Lacerda developed decreased blood pressure due to  dehydration related to poor p.o. intake associated with an increased  dose of diuretic.  The latter has been discontinued for the time being.  Her creatinine was 1.7 in the emergency department.  If this persists,  she cannot take metformin.  She had been taking Cymbalta for a long  time, but this was discontinued when  she was in hospital.  She resumed  it about a week ago, subsequent to the onset of her nausea.   I have asked her to hold metformin and Cymbalta for now in addition to  the medications that were stopped 2 days ago.  It is very difficult to  treat her with continued GI symptoms and no etiology.  I will obtain  another chemistry profile as well as an amylase and lipase level.  We  called your office today, but you were not available to discuss this  issue.  I took the liberty of sending Madelaine to Dr. Luvenia Starch office for  assessment of her nausea.  I will see her again in 1 week.    Sincerely,      [name]. Dietrich Pates, MD, [facility]  Electronically Signed    RMR/MedQ  DD: 05/01/2008  DT: 05/02/2008  Job #: [id number]

## 2010-11-16 NOTE — H&P (Signed)
NAMELEWIS, GRIVAS NO.:  [id number]   MEDICAL RECORD NO.:  [id number]          PATIENT TYPE:  INP   LOCATION:  2921                         FACILITY:  [facility]   PHYSICIAN:  Marca Ancona, MD      DATE OF BIRTH:  11-Nov-1950   DATE OF ADMISSION:  03/23/2008  DATE OF DISCHARGE:                              HISTORY & PHYSICAL   PRIMARY CARDIOLOGIST:  [name]. Dietrich Pates, MD, [facility].   PRIMARY CARE PHYSICIAN:  [name], M.D.   HISTORY OF PRESENT ILLNESS:  This is a 60 year old with a history of  diabetes, hypertension, diastolic congestive heart failure, and morbid  obesity who presents with congestive heart failure exacerbation and  rapid atrial fibrillation.  The patient states that she first noted  feeling weak on Thursday and Friday.  She also had mild dyspnea on  exertion, walking in the halls at work.  On Saturday, she began to feel  nauseated and said she had somewhat flu-like symptoms.  She stayed in  bed or in a chair all day long.  By Saturday evening, she developed  significant orthopnea; however, she felt okay when she was sitting up.  This morning, the patient was short of breath even with sitting up at  rest.  She denies any chest pain throughout although, she has had no  fevers or chills.  She came to the emergency department and was found to  be in atrial fibrillation with a rapid ventricular response with the  heart rate up to the 140s, and she was also volume overloaded.  Finally,  she was noted on admission to have a blood pressure of 198/101.  The  patient was started on diltiazem drip at 5 mg/hour and a nitroglycerin  drip at 10 mcg/minute.  She was also given Lasix 40 mg IV bolus.  Also,  the patient states that she has a 20-pound weight gain over the last 3  days based on her home scales.   PAST MEDICAL HISTORY:  1. Nonobstructive coronary artery disease.  The patient had a left      heart catheterization done in 2002, it showed only  luminal      irregularities.  She had a Myoview done in April 2008, this showed      a breast attenuation artifact with no evidence for ischemia.  2. Diabetes.  3. Hypertension.  4. Hypercholesterolemia.  5. Obesity.  6. Diastolic congestive heart failure.  The patient's most recent echo      was done in August 2003, and it showed mild-to-moderate left      ventricular hypertrophy and an EF of 55-60%.  7. Obstructive sleep apnea.  8. Hypothyroidism.  9. Iron-deficiency anemia.  10.Hiatal hernia.  11.Gastroesophageal reflux disease.  12.Cholecystectomy.  13.History of esophageal dilation in 2008.   SOCIAL HISTORY:  The patient is married, she has 2 daughters.  She works  in the [employer] in [place].  She quit  smoking in 1986.  She does not drink alcohol.   FAMILY HISTORY:  There is no early coronary artery disease.  Her mother  had a myocardial infarction at the age of 81.   REVIEW OF SYSTEMS:  Negative except as noted in the history of present  illness.   LABORATORY DATA:  Today, BNP 342, white count 8.5, hematocrit 32.7,  platelets 280, potassium 3.9, BUN 11, and creatinine 0.7.  First set of  cardiac enzymes, CK-MB 1.6 and troponin less than 0.05.  Chest x-ray  shows cardiomegaly and evidence of congestive heart failure.  EKG shows  atrial fibrillation at 134, there is poor R-wave progression.   PHYSICAL EXAMINATION:  VITAL SIGNS:  Heart rate 120s to 140s, in atrial  fibrillation; O2 sat 96% on 3 L by nasal canula; blood pressure is  198/101, has fallen to 133/88 on diltiazem and nitroglycerin.  The  patient is afebrile.  GENERAL:  No apparent distress.  She is an obese female.  HEENT:  Normal.  NECK:  JVP 10-12 cm of water.  No thyromegaly or thyroid nodule.  LUNGS:  There are crackles at the bases to one-half way up bilaterally.  CARDIOVASCULAR:  Regular S1 and S2.  No S3.  No S4.  There is 1/6  crescendo-decrescendo systolic murmur at the  right sternal border.  There is 1+ ankle edema.  There is no carotid bruit.  EXTREMITIES:  No clubbing or cyanosis  ABDOMEN:  Obese, soft, and nontender.  Bowel sounds are present.  No  hepatosplenomegaly.  SKIN:  Normal.  MUSCULOSKELETAL:  Normal.   MEDICATIONS AT HOME:  1. Metformin 1000 mg b.i.d.  2. Omeprazole 20 mg b.i.d.  3. Lantus 50 units at bedtime.  4. Glipizide 10 mg daily.  5. Ramipril 10 mg daily.  6. Aspirin 81 mg daily.  7. Synthroid 175 mcg daily.  8. Regular insulin sliding scale.  9. Lasix 40 mg p.o.; however, the patient has not taken any for the      last 3 to 4 months.   ASSESSMENT AND PLAN:  This is a 60 year old with new-onset atrial  fibrillation and volume overload.  1. Atrial fibrillation.  This is likely the trigger for the patient's      diastolic congestive heart failure exacerbation.  We are not      completely sure of the cause for the new-onset atrial fibrillation.      The patient, however, does have multiple risk factors including      congestive heart failure, hypertension, and diabetes.  There is no      pneumonia on x-ray.  White count is normal.  So, there is no      definite evidence of infection.  We will continue trying to improve      her rate control.  We will give her a bolus of diltiazem 10 mg IV      now and then increase her drip up to 10 mg an hour.  We can      increase that to 15 mg an hour, if this is not adequate.  We will      add Lopressor 12.5 mg q.6 h.  We will anticoagulate her with the      heparin drip and if the patient remains in atrial fibrillation,      since this is her first episode, she is severely symptomatic, we      will arrange for TEE cardioversion after diuresis.  Of note, she      did have history of esophageal dilation, this was done back in      2008, and  she has had no dysphagia whatsoever since then.  2. Congestive heart failure exacerbation.  The patient has volume      overload on exam with  significant symptoms.  This is a diastolic      congestive heart failure exacerbation, is likely triggered by new-      onset atrial fibrillation with a rapid ventricular response.  There      is pulmonary edema with chest x-ray.  We will diurese her with      Lasix 40 mg IV q.8 h.  We will control her heart rate.  We will      assess her LV function with either a TEE or TTE.  3. Type 2 diabetes.  The patient will be n.p.o. at midnight for      possible cardioversion, so we will use half of her typical Lantus      dose tonight.  We will have her on a sliding scale of insulin      aspart.  4. Hypertension.  The patient's blood pressure was as high as 190/101      initially.  Now, it is better on nitroglycerin and diltiazem.  The      high blood pressure will exacerbate her congestive heart failure.      So, we will control her blood pressure with the nitroglycerin drip      for now.  5. We will check her for urinary tract infection with urinalysis and      culture.      Marca Ancona, MD  Electronically Signed     DM/MEDQ  D:  03/23/2008  T:  03/24/2008  Job:  [id number]

## 2010-11-16 NOTE — Discharge Summary (Signed)
[name], [name]                 ACCOUNT NO.:  [id number]   MEDICAL RECORD NO.:  [id number]          PATIENT TYPE:  OBV   LOCATION:  2013                         FACILITY:  [facility]   PHYSICIAN:  Madaline Guthrie, M.D.    DATE OF BIRTH:  [birth year]   DATE OF ADMISSION:  06/03/2008  DATE OF DISCHARGE:  06/05/2008                               DISCHARGE SUMMARY   DISCHARGE DIAGNOSES:  1. Chest pressure, ruled out for myocardial infarction with negative      serial EKGs and cardiac enzymes.  2. Fever and cough.  3. Urinary tract infection, Escherichia coli.  4. History of congestive heart failure secondary to diastolic      dysfunction.  Followed by Dr. Dietrich Pates.  Transesophageal echo in      September 2009 showed normal left ventricular ejection fraction and      patent foramen ovale.  5. Hypertension.  6. Diabetes mellitus type 2, complicated by gastroparesis.      (Hemoglobin A1c 8.6 on June 04, 2008).  7. Hyperlipidemia.  8. Gastroesophageal reflux disease, status post balloon dilation in      May 2008.  9. History of paroxysmal atrial fibrillation in September 2009, on      warfarin.  10.Nonalcoholic fatty liver disease.  11.Hypothyroidism.  12.Obstructive sleep apnea.   DISCHARGE MEDICATIONS:  1. Reglan 10 mg p.o. q.a.c. nightly.  2. Omeprazole 20 mg p.o. b.i.d.  3. Sliding scale insulin.  4. Levothyroxine 0.175 mg p.o. daily.  5. Lorazepam 1 mg p.o. t.i.d.  6. Warfarin 5 mg on Tuesday and 2.5 mg all other days by mouth.  7. Ondansetron 4 mg p.o. daily.  8. Lantus insulin 50 units subcutaneously nightly.  9. Skelaxin 800 mg p.o. t.i.d.  10.Metformin 1000 mg p.o. b.i.d.  11.Glipizide 10 mg p.o. daily.  12.Altace 10 mg p.o. daily.  13.Lasix 20 mg p.o. daily.  14.Cymbalta 60 mg p.o. daily.  15.Potassium chloride 20 mEq p.o. daily.  16.Ciprofloxacin 500 mg p.o. q.8 h. x5 doses.  17.Tamiflu 75 mg p.o. b.i.d. x5 days.   DISCHARGE CONDITION AND FOLLOWUP:  The patient was  discharged in stable  condition, her chest pressure having resolved while she was in the  emergency department.  She did have a fever on the morning of discharge  as well as a cough and was provided a prescription for Tamiflu to take  as an outpatient.  She also was diagnosed with a urinary tract infection  in the emergency room and was provided with a prescription for  ciprofloxacin to complete a 3-day course for this.  She is to follow up  with the [facility] on June 09, 2008, at 8:45 a.m. where  she will need monitoring of her INR and appropriate adjustment if  necessary of her Coumadin dose.  Dr. Michelle Nasuti office was closed on the  day of discharge; however, the patient will call him for an appointment  in the next couple of weeks.  At that appointment, items requiring  followup are:  1. Resolution of the patient's febrile illness.  2. Medication reconciliation:  The  patient was not sure of the exact      dose of potassium she had been taking and reported that at some      point, she had been told to stop taking Vytorin although her      medication list still had Vytorin on it.  3. Her LDL was 129 and given her diabetes and cardiac problems, the      goal for her should certainly be less than 100 or possibly even as      low as 70.  Her transaminases were elevated on admission and were      trending down.  It would be reasonable to recheck her liver      function tests and discuss whether restarting a statin would be      appropriate.  4. Her TSH level was 0.276 on her current dose of Synthroid, and it      might be reasonable to reduce the dose and recheck her TSH in the      future.   PROCEDURES PERFORMED:  1. Abdominal ultrasound dated June 04, 2008, showed liver      parenchyma within normal limits and no acute abdominal findings.  2. Barium swallow dated June 05, 2008, showed no esophageal      stricture, small hiatal hernia of the sliding type,       gastroesophageal reflux with poor clearing by peristalsis.   BRIEF ADMITTING HISTORY AND PHYSICAL:  [name] is a 60 year old  female with past medical history outlined above, who was driving in her  car on her way to work when she noticed increasing chest pressure in her  left upper chest.  There was no diaphoresis, no nausea, and no  radiation.  She did have associated dyspnea and tachypnea and anxiety.  She drove to the emergency department and was admitted for workup of her  chest pain.  Once in the ED, she did have some sweating.  Her pain was  relieved in the ED with nitroglycerin.  She did note a cough,  nonproductive for few days prior to admission but no fevers, no upper  respiratory symptoms, and no nausea, vomiting, or diarrhea.  She was  walking approximately 1 mile per day at home for exercise and feeling  good prior to this incident.  She did have a migraine headache the day  before admission and had been taking Excedrin for this.   VITAL SIGNS: Temperature 98.1, blood pressure 154/61, pulse 80,  respirations 22, and oxygen saturation 96% on room air.  GENERAL:  She is an obese, middle-aged woman, in no acute distress.  LUNGS:  Clear to auscultation bilaterally with good air movement.  HEART:  Regular rate and rhythm with no murmurs, rubs, or gallops.  ABDOMEN:  Obese.  Bowel sounds were present.  Abdomen was soft, mildly  tender throughout on deep palpation, and there were no masses or  organomegaly noted.  EXTREMITIES:  She had trace edema up to the knees bilaterally.  NEUROLOGIC:  She was alert and oriented, and her neurologic exam was  nonfocal.   LABORATORIES:  Sodium 137, potassium 3.7, chloride 103, bicarbonate 23,  BUN 9, creatinine 0.65, and glucose 244.  White blood cells 7.7,  hemoglobin 10.5, and platelets 218.  Her LFTs were bilirubin 1.2,  alkaline phosphatase 131, AST 177, ALT 207, protein 6.2, albumin 2.9,  and calcium 8.8.  Urinalysis showed glucose  greater than 1000, nitrite  positive, 7-10 white blood cells, and many bacteria.  Her BNP was 190.  Her INR was 2.4.  Her PT was 28 and her PTT was 42.  Point-of-care  cardiac markers were negative.  Chest x-ray showed cardiomegaly but no  infiltrate or edema.  EKG showed normal sinus rhythm, left axis  deviation but no signs of acute ischemia.   HOSPITAL COURSE:  1. Chest pressure:  The patient was ruled out for acute myocardial      infarction with negative serial EKGs and cardiac enzymes.  Her      symptoms resolved with nitroglycerin in the emergency department.      Since she had a history of gastroesophageal reflux and balloon      dilation of the esophagus, a barium swallow was performed with the      findings described above and showed no evidence to suggest a GI      etiology for her symptoms.  She was discharged on her home      medications for hypertension.  Her fasting lipid panel showed total      cholesterol of 203, triglycerides 172, LDL 129, and HDL 40.  It      would be reasonable to recheck her liver function panel and      consider placing her on a statin to reduce her LDL given her      diabetes and cardiac history.  2. Fever and cough:  During her hospitalization, the patient developed      a fever in the low 100.  Because of her tenderness in the right      upper quadrant, an abdominal ultrasound was obtained with the      findings described above and gave no evidence of the GI source for      her fever.  Her urine culture showed that her E. coli was      susceptible to Cipro and that she was receiving appropriate therapy      for her urinary tract infection.  Given her fever and cough, she      was placed on respiratory isolation and given a prescription for      Tamiflu to take as an outpatient.  She was instructed not to return      to work until 2 days after she is afebrile.  3. Urinary tract infection:  The patient was started on ciprofloxacin      for her  dirty urinalysis on admission.  Her urine culture grew E.      coli, which was susceptible to ciprofloxacin and she was discharged      with a prescription to complete a full 3-day course.  4. Hyperlipidemia.  As discussed above, the patient's LDL was 129,      which is above goal for a patient with diabetes.  It would be      reasonable to recheck her liver function panel and consider placing      her on a statin.   DISCHARGE LABORATORIES AND VITAL SIGNS:  Temperature 99.3, pulse 91,  respirations 20, blood pressure 141/86, and oxygen saturation 91-96% on  room air.  White blood count 9.3, hemoglobin 10.0, and platelets 210.  Sodium 132, potassium 3.4, chloride 101, bicarbonate 25, glucose 281,  BUN 8, creatinine 0.58, and calcium 8.4 (please note that the patient  received a total of 50 mEq of potassium chloride after those labs were  drawn).  INR 1.9.      Loel Dubonnet, MD  Electronically Signed  Madaline Guthrie, M.D.  Electronically Signed    PN/MEDQ  D:  06/06/2008  T:  06/07/2008  Job:  [id number]   cc:   [name], M.D.  [facility]

## 2010-11-16 NOTE — H&P (Signed)
[name], GLANZ                 ACCOUNT NO.:  [id number]   MEDICAL RECORD NO.:  [id number]          PATIENT TYPE:  INP   LOCATION:  5504                         FACILITY:  [facility]   PHYSICIAN:  Darryl D. Prime, MD    DATE OF BIRTH:  1951/03/03   DATE OF ADMISSION:  02/11/2009  DATE OF DISCHARGE:                              HISTORY & PHYSICAL   The patient is full code.   PRIMARY CARE PHYSICIAN:  [name], M.D.   CARDIOLOGIST:  [name]. Dietrich Pates, MD, [facility].   CHIEF COMPLAINT:  Dizziness, shortness of breath and rapid heart rate.   HISTORY OF PRESENT ILLNESS:  [name] is a 60 year old female with  history of congestive heart failure with preserved ejection fraction,  history of paroxysmal atrial fibrillation.  She notes a history of  shortness of breath, dizziness tonight.  She apparently was doing her  errands yesterday all day and was out in the heat.  She notes she felt  very hot and possibly dehydrated.  When she came home tonight, she noted  profound shortness of breath and dizziness.  She had a sensation of her  heart racing.  This lasted from 2:30 p.m. to 6 p.m. When her daughter  home, she was brought to the emergency room.  Blood pressure was 100/59  at home which is the usual for her. The patient did not have any chest  pain.  She does have a history of atrial fibrillation status post  cardioversion in September 2009, and diltiazem was discontinued in  November 2009.  The patient has history of obesity and she has been  trying to lose weight.  She lost 16 pounds over the last year.  She  denies any cough, fever or diarrhea.  The patient denies any black or  bloody stools.  In the emergency room, she was given meclizine.   ALLERGIES:  She is allergic to HYDROCODONE AND CODEINE.   MEDICATIONS:  1. For atrial fibrillation, Coumadin 2.5 mg daily.  2. Ramipril 10 mg daily.  3. Metformin 2000 mg twice a day.  4. Glipizide 10 mg daily.  5. NovoLog sliding  scale insulin.  6. Lantus 50 units q.h.s.  7. Synthroid 150 mcg daily.  8. Skelaxin 800 mg 3 times a day.  9. Lorazepam 1 mg 3 times a day.  10.Lipitor daily.  11.Reglan 10 mg 4 times a day.  12.Omeprazole 10 mg 4 times a day.  13.Lasix 10 mg as needed.   PAST MEDICAL HISTORY AND SURGICAL HISTORY:  As above.  She has a history  of:  1. Hypertension.  2. Hyperlipidemia.  3. Obstructive sleep apnea and she does not use CPAP.  4. Iron-deficiency anemia.  5. Gastroesophageal reflux disease.  6. Hiatal hernia.  7. Hypothyroidism.  8. Status post hysterectomy.  9. History of back surgery.  10.History of EGD with dilatation of the esophagus.  11.Status post cholecystectomy.  12.History of carpal tunnel surgery.   The patient had a cardiac catheterization in 2001 with no significant  coronary artery disease.  She has a history of TEE  performed showing a  PFO in September 2009.  She has a history of echocardiogram in 2003  showing EF of 55% to 65%. Myoview performed in April 2008 was negative  for ischemia.   SOCIAL HISTORY:  The patient lives in [place] and she was a former  Radiation protection practitioner at [employer].  Remote history of tobacco but  has not smoked in more than 10 years.  No alcohol or illicit drug use.   FAMILY HISTORY:  Mother died of Alzheimer's disease.  Mother also had  hypertension, a myocardial infarction and a stroke.  Two sisters with  hypertension.   REVIEW OF SYSTEMS:  A 14 point review of systems negative unless stated  above.   PHYSICAL EXAMINATION:  VITAL SIGNS: Temperature is 98.2 with pulse of  97, respiratory rate of 16, blood pressure 97/62, saturations 97% in  room air.  The patient did have orthostatics performed that were  concerning:  On standing her blood pressure is 101/42, heart rate was  123;  laying flat blood pressure 114/67, heart rate of 82.  GENERAL: She is an obese female who looks her stated age.  HEENT: Normocephalic,  atraumatic.  Pupils equal, round and react to  light.  Extraocular being intact.  The oropharynx reveals no posterior  pharyngeal lesions.  NECK:  Supple with no lymphadenopathy or thyromegaly.  No carotid  bruits.  No jugular venous distention.  LUNGS: Clear to auscultation bilaterally with no wheezes.  CARDIOVASCULAR EXAM:  Regular rhythm and rate.  No murmurs.  ABDOMEN: Obese, soft, nontender, nondistended with no  hepatosplenomegaly.  EXTREMITIES:  Show no clubbing, cyanosis, she has trace lower extremity  edema.  NEUROLOGIC EXAM:  She is alert and oriented x4.  Cranial nerves II  through XII grossly intact.  Strength and sensation grossly intact.  SKIN:  Shows no rashes or ulcers.   STUDIES:  Chest x-ray shows no acute cardiopulmonary disease.  EKG  showed sinus rhythm with a ventricular rate of 86 beats per minute, left  axis deviation and early transition of the precordial leads, PR interval  132, QRS 94, QT corrected 454.  No major change changes except for  nonspecific ST-T changes and new T-wave inversions compared to EKG in  February 2010.   LABORATORY DATA:  INR was 1.2 subtherapeutic PT 15.2, PTT 24.  White  count 10.70, hemoglobin of 12.8, hematocrit 38.8.  Hemoglobin 12.8,  hematocrit 38.8, platelets 257.  Sodium 138, potassium 3.9, chloride 108  bicarb 21, BUN 24, creatinine 1.15, calcium 9.6, glucose 112.  Cardiac  markers were unremarkable.  At 2108 hours, B-type natriuretic peptide  was 45.  Urinalysis showed small leukocyte esterase, otherwise negative.   ASSESSMENT/PLAN:  This is a patient with a history of paroxysmal atrial  fibrillation, who may have had an episode of paroxysmal atrial  fibrillation at home that was not picked up here, possibly related to  hypovolemia.  She was in the sun yesterday and felt a little dehydrated.  She has borderline orthostatic hypotension at this time she will be  admitted to rule out acute coronary syndrome.  This was the  concern in  the ED.  She will be on telemetry to rule out recurrent atrial  fibrillation.  Will hold off on beta blockers and calcium channel  blockers for now.  Will check a TSH and magnesium and will recheck  orthostatics in the a.m. and give gentle hydration.  INR is  subtherapeutic.  Will continue Coumadin for now.  There is no clear  indication for heparin drip at this time.  She did refuse CPAP tonight.  She will be treated for hypovolemia with gentle hydration as above.  She  will be on observation.  Cardiac markers x3 will be ordered.      Darryl D. Prime, MD  Electronically Signed     DDP/MEDQ  D:  02/12/2009  T:  02/12/2009  Job:  [id number]

## 2010-11-16 NOTE — Consult Note (Signed)
NAMEERANDY, MCEACHERN NO.:  [id number]   MEDICAL RECORD NO.:  [id number]          PATIENT TYPE:  EMS   LOCATION:  MAJO                         FACILITY:  [facility]   PHYSICIAN:  Marca Ancona, MD      DATE OF BIRTH:  [birth year]   DATE OF CONSULTATION:  04/29/2008  DATE OF DISCHARGE:                                 CONSULTATION   PRIMARY CARE PHYSICIAN:  [name], MD, [facility]   PRIMARY CARDIOLOGIST:  [name]. Dietrich Pates, MD, [facility]   CHIEF COMPLAINT:  Presyncope.   HISTORY OF PRESENT ILLNESS:  Ms. Jerrett is a 60 year old female with a  history of diastolic congestive heart failure.  She has had nausea and  vomiting without diarrhea since April 26, 2008.  She saw Dr. Dietrich Pates  on April 24, 2008 and she was using Zofran to control the symptoms  with some success.  She saw her primary MD on April 25, 2008 and had  an IM Phenergan shot as well as BMET with results not available to [place] at  this time.  Her symptoms resolved.  Her p.o. intake improved but was  still poor.  She still had early satiety.  She was monitoring her blood  sugars which were normal for her meaning they were elevated.  She worked  yesterday and had some orthostatic dizziness, but otherwise felt okay.  This a.m. she had a nonproductive cough and slight nausea, so she took  Zofran prior to taking her morning medications.  She ate a minimal  breakfast and came to work.  She had orthostatic dizziness all morning.  At approximately 10 a.m., the dizziness and weakness came on while she  was sitting still.  She had a presyncopal episode where she states she  lost vision.  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  Her heart  rate was in the 50s and 60s.  She was sent to the emergency room as the  patient and given IV fluids.  Her blood pressure improved after  approximately 1.7 L of IV fluids.  Her orthostatics  were initially  positive with a systolic blood pressure that dropped 30 point going from  lying to standing, but at this time her systolic lying was 100 and  standing was 97.  She still does feel weak.  She has not had chest pain  or shortness of breath.  She has not had palpitations.   PAST MEDICAL HISTORY:  1. Status post cardiac catheterization in 2002 with luminal      irregularities.  2. Status post Myoview in 2008 showing breast attenuation, but no      ischemia.  3. Diabetes.  4. Hypertension.  5. Hyperlipidemia.  6. Obesity.  7. Chronic diastolic congestive heart failure with a transesophageal      echocardiogram on March 24, 2008 showing a normal EF, trivial      MR, increased left atrial pressures, and patent foramen ovale on      bubble study.  8. Paroxysmal atrial fibrillation status post cardioversion in      September 2009.  9. Obstructive sleep apnea.  10.Iron deficiency anemia.  11.Irritable bowel syndrome.  12.Hiatal hernia and gastroesophageal reflux disease.  13.Hypothyroidism.   PAST SURGICAL HISTORY:  She is status post cardiac catheterization as  well as cholecystectomy, EGD with dilatation, hysterectomy, back  surgery, and carpal tunnel surgery.   ALLERGIES:  CODEINE.   CURRENT MEDICATIONS:  1. Metformin 1 gram b.i.d.  2. Lorazepam 1 mg t.i.d.  3. Omeprazole 20 mg b.i.d.  4. Allegra 180 mg daily.  5. Sliding scale insulin between 7 and 15 units.  6. Lasix 40 mg 1/2 tablet daily.  7. Skelaxin 800 mg t.i.d. p.r.n.  8. Synthroid 175 mcg daily.  9. Cymbalta 60 mg a day.  10.Altace 10 mg a day.  11.Coumadin 2.5 mg daily.  12.Metoprolol 50 mg b.i.d.  13.Cardizem CD 120 mg a day.  14.Lantus 50 units daily.  15.Glipizide 10 mg daily.  16.Zofran 4 mg p.r.n.   SOCIAL HISTORY:  She lives in [place] with her husband.  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  She has an approximately 20-  pack year history of tobacco use, but quit  23 years ago and denies  alcohol or drug abuse.   FAMILY HISTORY:  Her mother died in her late 6s with Alzheimer's and a  history of coronary artery disease, but not until she was in her 14s.  There is no information on her father and there is no coronary artery  disease in any siblings.   REVIEW OF SYSTEMS:  Her weight has gone down 4 pounds in the last 7  days.  She has chronic dyspnea on exertion which is not changed  recently.  She has not had orthopnea, PND, or edema.  The presyncope was  today.  The cough was also today only.  She has occasional arthralgias.  Because of the IBS, she normally has loose stools but she actually had  some constipation earlier this week.  She has not vomited since April 23, 2008.  Her p.o. intake, although she says it has improved sounds  poor.  Full 14-point review of systems is otherwise negative.   PHYSICAL EXAMINATION:  VITAL SIGNS:  Temperature is 97.6, blood pressure  initially 78/56 and after fluids 91/26.  Initial orthostatic vital  signs:  Blood pressure 94/32 lying, 106/27 sitting, and 85/26 standing.  Current orthostatics are blood pressure 100/31 lying, 110/31 sitting,  and 97/42 standing.  GENERAL:  She is a well-developed obese white female in no acute  distress at rest.  HEENT:  Normal.  NECK:  There is no lymphadenopathy, thyromegaly, bruit, or JVD noted.  CVA:  Her heart is regular in rate and rhythm with an S1 and S2 and a  soft systolic murmur is noted.  Distal pulses are intact in all 4  extremities and no femoral bruits are appreciated.  LUNGS:  Essentially clear to auscultation bilaterally.  SKIN:  No rashes or lesions are noted.  ABDOMEN:  Soft, nontender with active bowel sounds.  EXTREMITIES:  There is no cyanosis, clubbing, or edema noted.  MUSCULOSKELETAL:  There is no joint deformity or effusions and no spine  or CVA tenderness.  NEUROLOGIC:  She is alert and oriented.  Cranial nerves II through XII  grossly  intact.   EKG is sinus bradycardia rate 59 with a left anterior fascicular block  and no acute ischemic changes.  LABORATORY VALUES:  Hemoglobin 12.9, hematocrit 39.6, WBCs 10.6, and  platelets 331.  Sodium 136, potassium 3.7, chloride 99, CO2 24, BUN 23,  creatinine 1.71, and glucose 195.  Point-of-care markers negative x1.   IMPRESSION:  Ms. Blades was seen today by Dr. Shirlee Latch.  She has  hypotension that is likely secondary to dehydration.  She has had poor  intake recently as well as nausea and vomiting.  She has had 1.7 L of  fluid so far.  We will continue the IV fluids and give her another 500  mL bolus.  If her symptoms improve, she can be discharged to home with  close outpatient followup in [place] with her primary care physician  and keep all scheduled cardiology appointments.  Her Lasix will be held  for 24 hours and she is to monitor her weight closely as well as  reporting orthostatic dizziness or other symptoms.  There is concern for  a GI origin to her symptoms, but GI evaluation can be arranged as an  outpatient through her primary care physician.      Theodore Demark, PA-C      Marca Ancona, MD  Electronically Signed    RB/MEDQ  D:  04/29/2008  T:  04/30/2008  Job:  [id number]   cc:   [name], M.D.

## 2010-11-16 NOTE — Discharge Summary (Signed)
NAMEHANLEY, [name]                 ACCOUNT NO.:  [id number]   MEDICAL RECORD NO.:  [id number]          PATIENT TYPE:  INP   LOCATION:  5504                         FACILITY:  [facility]   PHYSICIAN:  Pricilla Riffle, MD, FACCDATE OF BIRTH:  May 10, 1951   DATE OF ADMISSION:  02/11/2009  DATE OF DISCHARGE:  02/13/2009                               DISCHARGE SUMMARY   PROCEDURES:  A 2-view chest x-ray.   PRIMARY/FINAL DISCHARGE DIAGNOSES:  Dizziness and hypotension felt  secondary to dehydration.   SECONDARY DIAGNOSES:  1. Hypertension.  2. Hyperlipidemia.  3. Paroxysmal atrial fibrillation.  4. Obstructive sleep apnea, not on continuous positive airway      pressure.  5. Iron-deficiency anemia.  6. Gastroesophageal reflux disease/hiatal hernia.  7. Hypothyroidism.  8. Status post hysterectomy, cholecystectomy, back surgery, carpal      tunnel surgery and esophagogastroduodenoscopy with dilatation.  9. Status post cardiac catheterization in 2002 showing no significant      coronary focal abnormalities.  10.Allergy or intolerance to HYDROCODONE and CODEINE.  11.History of patent foramen ovale by transesophageal echocardiography      in September 2009 with preserved left ventricular function.  12.Family history of coronary artery disease in her mother.   TIME AT DISCHARGE:  34 minutes.   HOSPITAL COURSE:  [name] is a 60 year old female with a history of  diastolic congestive heart failure.  She was short of breath and dizzy  on the day of admission and her blood pressure was 100/59 which was low  for her.  She was admitted for further evaluation and treatment.   Her cardiac enzymes were negative for MI.  She was hydrated and her  condition improved.  TSH was within normal limits at 0.358, and BNP was  less than 30.  Her blood sugars was controlled with medications and  sliding scale.  Her INR was subtherapeutic, so she was given heparin and  Coumadin dose was increased.  A urine  culture was performed, but the  results are pending at the time of dictation.  The urinalysis showed  only rare bacteria and 0-2 wbc's per high-power field.   On February 13, 2009, Ms. Police's orthostatic vital signs were normal.  Her dizziness had improved.  She had maintained sinus rhythm throughout  her hospital stay.  She was evaluated by Dr. Dietrich Pates who felt Ms.  Shorb was stable for discharge with close outpatient followup.   DISCHARGE INSTRUCTIONS:  Her activity level is to be increased  gradually.  She is to stick to a low-sodium, diabetic diet.  She is to  get an INR next week.  She is to follow up with Dr. [name] as needed  and an appointment would be made with Dr. Dietrich Pates.   Discharge medications are per the discharge computerized list.      Theodore Demark, PA-C      Pricilla Riffle, MD, [facility]  Electronically Signed    RB/MEDQ  D:  02/13/2009  T:  02/13/2009  Job:  [id number]   cc:   [name], M.D.

## 2010-11-19 NOTE — Cardiovascular Report (Signed)
[place]  Patient:    [name], [name] Visit Number: [id number] MRN: [id number]          Service Type: MED Location: [id number] Attending Physician:  Ronaldo Miyamoto Admit Date:  11/03/2000   CC:         Gareth Morgan, M.D.             Cardiac Catheterization Lab             [name]. Dietrich Pates, M.D. [facility]                        Cardiac Catheterization  INDICATIONS:  [name] is a very delightful 60 year old [place] employee who presented to the emergency room with chest pain.  She does have diabetes. Because of this, she was subsequently admitted.  Her enzymes were negative. She had a CT scan which suggested a small nodule.  It was felt to be benign, but it was difficult to determine whether or not she had any pulmonary emboli. A d-dimer was borderline elevated.  A VQ scan was read as low probability. Based upon her diabetes history and the presence of deep chest discomfort, it was felt that she needed further evaluation, and cardiac catheterization was recommended.  In addition, she was noted to have some iron deficiency anemia, and the patient has had a prior hysterectomy.  PROCEDURES: 1. Left heart catheterization. 2. Selective coronary arteriography. 3. Selective left ventriculography. 4. Proximal root aortography to attempt to identify any possible anomalous    origin to the circumflex artery.  DESCRIPTION OF PROCEDURE:  The procedure was performed from the right femoral artery using 6-French catheters.  She tolerated the procedure without complication.  We used diagnostic Judkins catheters to enter the coronary arteries.  There was a large intermedius system, but there was a very small vessel in the AV groove.  As a result of this, we did a proximal root aortogram to try to make sure that there was not an anomalous origin to the circumflex.  None was visualized.  The procedure was completed, and she was taken to the holding  area in satisfactory clinical condition.  HEMODYNAMIC DATA:  The central aortic pressure was 163/82.  LV pressure 164/22.  There was no gradient on pullback across the aortic valve.  ANGIOGRAPHIC DATA: 1. Left ventriculography was performed in the RAO projection.  There appeared    to be some calcification in the mitral annulus area.  There did not appear    to be significant mitral regurgitation.  Overall systolic function was    normal.  Ejection fraction was calculated at 64%.  No segmental wall motion    abnormalities were identified. 2. Aortic root aortography revealed no evidence of significant aortic    regurgitation and no obvious evidence of dissection.  Likewise, we could    not identify an anomalous origin to the circumflex coronary artery. 3. Coronary arteriography    a. The left main coronary artery was free of critical disease.    b. The LAD coursed to the apex.  There was one large septal and one modest       sized diagonal.  There was very mild luminal irregularity in the distal       vessel, but there did not appear to be significant focal narrowing.    c. There was a small ramus intermedius vessel that was free of critical  disease.    d. There was a large branch which had a takeoff of the first marginal.  It       bifurcated distally into several sub branches which supplied the lateral       wall.  There was a very small AV portion to the circumflex artery.  No       high grade stenoses were noted.    3. The right coronary artery was a dominant vessel providing a posterior       descending and a modest sized posterolateral system; however, there was       a far-reaching posterolateral branch that occupied part of the       circumflex territory.  No high grade focal stenoses were noted.  CONCLUSIONS: 1. Normal left ventricular function. 2. No significant evidence of aortic regurgitation or dissection. 3. No significant coronary focal abnormalities. 4.  Unexplained iron deficiency anemia with prior hysterectomy. 5. Small lung nodule in the right middle lobe.  DISPOSITION:  I plan to have [name] see Dr. [name] later this week.  We have suggested that they make arrangements for a follow-up CT scan in three months.  In addition, the patient knows about this, and I have informed Dr. [name] as well.  The patient also needs evaluation for iron deficiency anemia of unexplained origin.  It is possible that perhaps gastritis accounts for some of her symptoms as well as musculoskeletal symptoms.  We will restart her Synthroid. Attending Physician:  Ronaldo Miyamoto DD:  11/06/00 TD:  11/06/00 Job: [id number]

## 2010-11-19 NOTE — H&P (Signed)
[name], [name]                 ACCOUNT NO.:  [id number]   MEDICAL RECORD NO.:  [id number]          PATIENT TYPE:  EMS   LOCATION:  MAJO                         FACILITY:  [facility]   PHYSICIAN:  [name]. Dietrich Pates, MD, FACCDATE OF BIRTH:  1951-02-24   DATE OF ADMISSION:  10/16/2006  DATE OF DISCHARGE:                              HISTORY & PHYSICAL   PRIMARY CARDIOLOGIST:  [name]. Dietrich Pates, MD, [facility]   PRIMARY CARE PHYSICIAN:  Dr. Metta Clines in [place].   HISTORY OF PRESENT ILLNESS:  A 59 year old obese Caucasian female  experienced chest pain while getting ready for work this morning.  She  had eaten breakfast and was sitting on the bed getting dressed when she  had a sudden onset of sharp, stabbing chest discomfort midsternally  radiating to her back.  She had associated nausea and dyspnea.  It  lasted 10 to 15 minutes.  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  She  states that she has not experienced pain like this before.  Her daughter  drove her to work and on arrival she expressed her symptoms and the  patient was admitted through the emergency room for further evaluation.  The pain had diminished somewhat by the time she got to the emergency  room.  She states that over the weekend she had had diaphoresis,  headache, and nausea prior to this episode.  Her headache was worse this  morning.  She is without pain now as she has received Dilaudid and  Zofran on admission.  Blood sugar on admission was found to be elevated  at 352.  The patient was given subcu insulin.  A followup blood sugar  evaluation revealed a level of 318 approximately 2 hours later.   PAST MEDICAL HISTORY:  1. Includes CHF secondary to diastolic dysfunction diagnosed in 2002      per Dr. Shawnie Pons.  Her echocardiogram revealing a normal      ejection fraction with no valve abnormality.  Doppler      interpretation was consistent  with diastolic dysfunction.  2. Obstructive sleep apnea (noncompliant with CPAP).  3. Diabetes.  4. Hypercholesterolemia.  5. Obesity.  6. Hypertension.  7. Irritable bowel syndrome.  8. Hypothyroidism.  9. Iron deficiency anemia.  10.History of small right lung nodule diagnosed in 1992.   PAST CARDIAC WORKUP:  1. Echocardiogram in 2002 revealing normal ejection fraction with no      valvular abnormality, read by Dr. Dorethea Clan.  2. Status post cardiac catheterization in 1992 per Dr. Shawnie Pons      revealing normal left ventricular function, no significant evidence      of aortic regurgitation or dissection.  No significant coronary      focal abnormalities.  Unexplained iron deficiency anemia with prior      hysterectomy, small lung nodule in the right middle lobe.   PAST SURGICAL HISTORY:  1. Cholecystectomy 1996.  2. Hysterectomy 1999.  3. Carpal tunnel repair 1994.  4. Laminectomy of the L4-L5 in 1995.  SOCIAL HISTORY:  The patient lives in [place] with her husband and  daughter.  She works as a Radiation protection practitioner at [employer].  She  has a remote history of smoking, but has not smoked in greater than 10  years.  Occasional use of alcohol.  No drug use.  Very little exercise.  She is on a ADA diet.  No oral medicine use.   FAMILY HISTORY:  Mother died of Alzheimer's.  The patient's mother also  had a history of hypertension, myocardial infarction, and a CVA.  Father's history is unknown.  She has 2 sisters with hypertension.   CURRENT MEDICATIONS:  At home:  1. Synthroid 175 mcg daily.  2. Naproxol 20 mg daily.  3. Altace 10 mg daily.  4. Aspirin 325 mg daily.  5. Vytorin 10/80 daily.  6. Lasix 60 mg p.r.n.  7. Glucophage 100 mg b.i.d.  8. Glipizide 10 mg daily.  9. Ativan 1 mg q.8-12 hours p.r.n.  10.Skelaxin p.r.n. for back pain.   ALLERGIES:  CODEINE CAUSING HALLUCINATIONS.   CURRENT LABORATORY DATA:  Hemoglobin 13.3, hematocrit 39.5, white  blood  cells 10.6, platelets 282.  Sodium 131, potassium 3.8, chloride 100, CO2  24, BUN 14, creatinine 0.78, glucose on admission 352.  AST 26, ALT 21,  albumin 3.3, D-dimer 0.57.  Point of care markers: myoglobin 78.5, CK-MB  1.6, troponin 0.05, PTT 57, PT 12.4, INR 0.9.   EKG revealing normal sinus rhythm with occasional premature ventricular  complexes.  New left anterior vesicular block with delayed R-wave  progression.  Chest x-ray revealed no evidence of CHF.  A CT scan of the  chest has been completed with results pending.   PHYSICAL EXAMINATION:  VITAL SIGNS:  Blood pressure 124/34, pulse 89,  respirations 20, temperature 98.4.  HEENT:  Head is normocephalic and atraumatic.  Eyes:  Pupils equal,  round, reactive to light and accommodation.  Mucous membranes of the  mouth, pink and moist.  Tongue is midline.  NECK:  Supple.  There is no JVD.  There is no carotid bruits  appreciated.  CARDIOVASCULAR:  Regular rate and rhythm without murmurs, rubs, or  gallop.  LUNGS:  Clear to auscultation.  ABDOMEN:  Soft, nontender, 2+ bowel sounds.  EXTREMITIES:  Without clubbing, cyanosis, or edema.  SKIN:  Warm and dry.  NEURO:  Intact.   IMPRESSION:  1. Atypical chest pain.  2. Uncontrolled diabetes.  3. Obesity.  4. Sleep apnea.   PLAN:  The patient was seen and examined by myself and Dr. Morningside Bing in the emergency room.  The patient has had nausea x2 days with  stabbing epigastric and lower substernal pain.  No EKG changes  indicative of ischemia.  Initial cardiac markers are negative.  I doubt  this is acute coronary syndrome with probably GI origin for discomfort.  We will increase her PPI to Protonix 30 mg b.i.d.  We will check lipids  and LFTs, amylase, and lipase.  We will admit to rule out MI and serial  cardiac enzymes and under observation status.  The patient will also have diabetic teaching, see her prior to discharge for better control of  blood pressure with  need for internal medicine consult concerning  diabetes management.      Bettey Mare. Lyman Bishop, NP      [name]. Dietrich Pates, MD, [facility]  Electronically Signed    KML/MEDQ  D:  10/16/2006  T:  10/16/2006  Job:  [id number]

## 2010-11-19 NOTE — Procedures (Signed)
   NAME:  [name], [name]                           ACCOUNT NO.:  [id number]   MEDICAL RECORD NO.:  [id number]                   PATIENT TYPE:  OUT   LOCATION:  RAD                                  FACILITY:  [facility]   PHYSICIAN:  [name], M.D.                DATE OF BIRTH:  [birth year]   DATE OF PROCEDURE:  11/15/2002  DATE OF DISCHARGE:                                  ECHOCARDIOGRAM   CLINICAL INFORMATION:  The patient is a 60 year old woman with diabetes,  hypertension, and congestive heart failure.   PREVIOUS STUDY:  @@   M-MODE:  AORTA:  @@  (<4.0)  LEFT ATRIUM:  @@  (<4.0)  SEPTUM:  @@  (0.7-1.1)  POSTERIOR WALL:  @@  (0.7-1.1)  LV-DIASTOLE:  @@  (<5.7)  LV-SYSTOLE:  @@  (<4.0)  E-SEPTAL:  @@  (<0.5)  RV-DIASTOLE:  @@  (<2.5)  IVC:  @@  (<2.0)   TWO-DIMENSIONAL AND DOPPLER IMAGING:  Technical quality of the study is  severely technically limited, likely due to the patient's body habitus.  Overall ejection fraction appears to be preserved, estimated between 55 and  60%.  Wall motion abnormalities are difficult to assess due to the technical  quality of the study.  Valvular heart disease is also extremely difficult to  assess due to the technical quality of the study.  There appears to be some  element of left ventricular hypertrophy and there appears to be a small  insignificant pericardial effusion.                                               [name], M.D.    JH/MEDQ  D:  11/15/2002  T:  11/16/2002  Job:  [id number]

## 2010-11-19 NOTE — Consult Note (Signed)
NAMEBRENNEN, GARDINER                 ACCOUNT NO.:  [id number]   MEDICAL RECORD NO.:  [id number]          PATIENT TYPE:  AMB   LOCATION:  DAY                           FACILITY:  [facility]   PHYSICIAN:  Lionel December, M.D.    DATE OF BIRTH:  22-Jul-1950   DATE OF CONSULTATION:  11/03/2006  DATE OF DISCHARGE:                                 CONSULTATION   REASON FOR CONSULTATION:  Acid reflux, hiatal hernia.   PHYSICIAN REQUESTING CONSULTATION:  John Giovanni.   HISTORY OF PRESENT ILLNESS:  [name] is a 60 year old Caucasian female who  presents today for further evaluation of acid reflux disease and hiatal  hernia.  She recently had atypical chest pain and was hospitalized at  [facility] for observation.  It was felt that her chest pain  was atypical.  She has underwent a Myoview adenosine stress test but is  awaiting results.  The day of her admission, she states she developed a  pressure in the center of her chest with intermittent stabbing and  nausea but no vomiting.  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  Her cardiac  markers were negative.  She had a D-dimer which was elevated at 0.57.  A  CT of the chest was performed which revealed a small hiatal hernia but  no evidence of acute pulmonary embolus.  It was felt that her chest pain  was most likely GI related.  She denies any further episodes such as  these.  She has been on PPI therapy chronically and switched to  omeprazole 20 mg b.i.d. about 3-6 months ago due to co-pay cost.  She  states she had been doing very well without any typical reflux symptoms.  She does have some dysphagia and has had four episodes where food has  become lodged and she vomited it back up.  This has been over the last 4  months.  On one occasion, it was a half of a K-Dur tablet.  She denies  any abdominal pain.  She has  IBS with tendency towards diarrhea which  has been stable.  She takes Imodium as needed usually 3-4 times a week.  Denies any melena or rectal bleeding.  She also has a history of  Barrett's esophagus which was seen on her last EGD in June 2002.  She  had a short segment which was new since her March 1995 EGD.  She also  had a small sliding hiatal hernia.  She was sent a letter 2 years later  for followup study, but this was never done.   CURRENT MEDICATIONS:  1. Metformin 1000 mg b.i.d.  2. Multivitamin daily.  3. Omeprazole 20 mg b.i.d.  4. Allegra 180 mg daily.  5. Lantus 50 units q.h.s.  6. Glipizide 10 mg daily.  7. Altace 10 mg daily.  8. Humulin R sliding scale q.a.c. q.h.s.  9. Vytorin 10/80 mg q.p.m.  10.Cymbalta daily.  11.Aspirin 81 mg daily.  12.Synthroid 170 mcg daily.  13.Skelaxin.  14.Lorazepam 1 tablet 2-3 times daily p.r.n.  15.Nitroglycerin 0.4 mg p.r.n.  16.Reglan 10 mg q.a.c. q.h.s.  17.Equate loperamide every other day p.r.n.   ALLERGIES:  COGESIC.   PAST MEDICAL HISTORY:  1. Nonobstructive coronary artery disease by cardiac catheterization      November 2002.  2. Type 2 diabetes mellitus.  3. Hypertension.  4. Hyperlipidemia.  5. Morbid obesity.  6. IBS.  7. History of diastolic CHF.  8. Hypothyroidism.  9. Sleep apnea with CPAP noncompliance.  10.History of a small right lung nodule 1992.  11.Status post hysterectomy, cholecystectomy, back surgery.   FAMILY HISTORY:  Mother deceased at age 42 with dementia.  Maternal  grandfather died with bleeding ulcer.  No family history of colorectal  cancer.  Two sisters with acid reflux disease.   SOCIAL HISTORY:  She is married, has two daughters and two stepchildren.  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  She quit  smoking 1986 but states she had a very limited use before that.  No  alcohol use.   REVIEW OF SYSTEMS:  See HPI for GI.  CONSTITUTIONAL:  No weight loss.   CARDIOPULMONARY:  No shortness of breath, atypical chest pain as above.   PHYSICAL EXAM:  VITAL SIGNS:  Weight 289, height 5 feet 6 inches, temp  98.7, blood pressure 130/88, pulse 80.  GENERAL:  Pleasant obese Caucasian female in no acute distress.  SKIN:  Warm and dry.  No jaundice.  HEENT:  Sclerae nonicteric.  Oropharyngeal mucosa moist and pink.  No  lesions, erythema or exudate.  No lymphadenopathy.  CHEST:  Lungs are clear to auscultation.  CARDIAC:  Regular rate and rhythm.  Normal S1-S2.  No murmurs, rubs or  gallops.  ABDOMEN:  Positive bowel sounds, obese but symmetrical, soft.  Mild epigastric tenderness to deep palpation.  Difficult to assess for  organomegaly due to body habitus.  EXTREMITIES:  No edema   IMPRESSION:  [name] is a 60-year lady with chronic gastroesophageal reflux  disease and history of Barrett's esophagus, hiatal hernia who recently  required hospitalization for atypical chest pain.  Adenosine Myoview  stress results are pending, and she is going to see Dr. Dietrich Pates on  Monday, May 5 for followup.  Based on history of Barrett's esophagus and  recent atypical chest pain, I would recommend upper endoscopy for  diagnostic purposes.  She also needs to have biopsies for surveillance  given the history of Barrett's esophagus.  She also complains of  dysphagia to solid foods and may have developed esophageal web, ring, or  stricture requiring dilatation   PLAN:  1. EGD in the near future with possible esophageal dilatation as well      as to follow up Barrett's esophagus.  2. The patient will discuss further with Dr. [name] recommendations      regarding possible lap band versus gastric bypass surgery which has      been recommended to her by Dr. [name] as well as cardiology group      when she was in the hospital.      Tana Coast, P.A.      Lionel December, M.D.  Electronically Signed   LL/MEDQ  D:  11/03/2006  T:  11/03/2006  Job:  [id number]    cc:   [name], M.D.  Fax: [phone]

## 2010-11-19 NOTE — Consult Note (Signed)
[place]. [facility]  Patient:    [name], [name] Visit Number: [id number] MRN: [id number]          Service Type: MED Location: [phone] Attending Physician:  Anastasio Auerbach Dictated by:   Doylene Canning. Ladona Ridgel, M.D. [facility] Proc. Date: 10/27/01 Admit Date:  10/26/2001   CC:         Arturo Morton. Riley Kill, M.D. [facility]  Viviann Spare Knowlton,M.D.   Consultation Report  REASON FOR CONSULTATION:  Consultation is requested by Dr. Anastasio Auerbach for evaluation of shortness of breath.  HISTORY OF PRESENT ILLNESS:  The patient is a very pleasant 60 year old woman who has a history of obesity and chest pain who underwent catheterization approximately 11 months ago.  At that time she had preserved LV function and no significant coronary disease.  She carries a diagnosis of obstructive sleep apnea and had a sleep study done back in March with the results not presently being available.  The patient states that she was in her usual state of health, but over the last 1-2 weeks has had increase in dyspnea and mild peripheral edema.  She also notes orthopnea and PND.  She was admitted for evaluation.  PAST MEDICAL HISTORY:  Is as previously noted. 1. Diabetes. 2. Hyperlipidemia. 3. She is status post hysterectomy, cholecystectomy, and laminectomy. 4. There is a history of hypothyroidism. 5. There is a history of iron deficiency anemia.  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 She quit smoking cigarettes over 30 years ago and rarely uses alcohol in excess.  FAMILY HISTORY:  Notable for diabetes and remote coronary disease.  REVIEW OF SYSTEMS:  Notable for no fevers, chills, or night sweats.  She denies adenopathy.  She denies headaches, vision or hearing problems.  She denies skin rashes or lesions.  She denies chest pain, claudication, or wheezing.  She does note a nonproductive cough which is now better in the hospital.  She denies dysuria,  hematuria, or nocturia.  She denies any weakness, numbness, or depression.  She denies myalgias or arthralgias, joint swelling, or deformities.  She denies nausea, vomiting, diarrhea, or constipation.  She denies abdominal pain.  She denies polyuria, polydipsia, heat or cold intolerance.  All other review of systems and signs are negative.  PHYSICAL EXAMINATION:  GENERAL:  She is a pleasant, well-appearing, morbidly obese, middle-aged woman in no distress.  VITAL SIGNS:  The blood pressure was 127/62, pulse 68 and regular, respirations were 20, temperature was 98.3.  NECK:  Revealed no jugular venous distention.  There was no thyromegaly.  The carotids were 2+ and symmetric.  The trachea was midline.  LUNGS:  Clear bilaterally to auscultation.  I did not appreciate any rales. There are no wheezes.  CARDIOVASCULAR:  Revealed a regular rate and rhythm with normal S1 and S2. There was a soft systolic murmur at the apex.  I could not appreciate an S3, although her heart sounds were somewhat distant.  ABDOMEN:  Obese, nontender, nondistended.  There is no obvious organomegaly.  EXTREMITIES:  Demonstrated no cyanosis or clubbing.  There was trace peripheral edema.  Joints demonstrated no effusions.  NEUROLOGIC:  She was alert and oriented x3, with cranial nerves II-XII being grossly intact.  Strength was 5/5 and symmetric.  LABORATORY:  EKG demonstrates normal sinus rhythm with IVCD.  There were rare PACs.  Labs demonstrate CK-MB at 120 and 2.1, troponin of 0.04.  Creatinine was 0.8. Hemoglobin was 12.  D-dimer  was 0.57.  BMP is pending.  IMPRESSION: 1. Probable congestive heart failure. 2. Obesity. 3. Possible sleep apnea, obesity, hypoventilation. 4. Probable diabetes.  DISCUSSION:  As the patient has had no history of ischemic heart disease, I think it is unlikely that she developed new arterial lesions.  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 Would recommend to proceed to with 2-D echo to assess her LV function, wall motion abnormality, and any murmur that was noted.  Will plan on obtaining fasting lipids.  Agree with continued IV Lasix therapy. Dictated by:   Doylene Canning. Ladona Ridgel, M.D. [facility] Attending Physician:  Anastasio Auerbach DD:  10/27/01 TD:  10/28/01 Job: [id number]

## 2010-11-19 NOTE — Letter (Signed)
Nov 06, 2006     RE:  TYSHIA, FENTER  MRN:  [id number]  /  DOB:  Feb 06, 1951   [name], M.D.  [address] [place], [place] [address]   Dear Brett Canales:   Ms. Friedlander returns to the office following a recent admission to [facility] and subsequent stress nuclear study.  This revealed breast  attenuation artifact, but no evidence for ischemia.  She continues to  have chest discomfort and is being evaluated by Dr. [name] for history  of GERD and hiatal hernia.  She is taking Prilosec on a b.i.d. basis  plus Reglan.  Her symptoms are quite tolerable.   Otherwise, diabetic control has been suboptimal.  She has required  increasing doses of insulin.  Blood sugar was recently above 400.   On exam, overweight pleasant woman.  The height is 5 feet 6 inches.  Weight is 283.  This yields a BMI of 46.  Blood pressure 130/80, heart  rate 72 and regular, respirations 16.  NECK:  No jugulovenous  distention; normal carotid upstrokes without bruits.  LUNGS:  Clear.  CARDIAC:  Normal first and second heart sounds; modest systolic ejection  murmur.  ABDOMEN:  Soft and nontender; no organomegaly.  EXTREMITIES:  Trace edema.   IMPRESSION:  Ms. Lovett continues to do well from a cardiac standpoint.  With a negative catheterization 6 years ago and a recent negative stress  nuclear study, I do not believe that she needs further cardiology  evaluation or treatment.  She is willing to consider weight reduction  surgery.  Due to comorbidities including hypertension and diabetes, this  would be a reasonable approach for her, despite the fact that she has  lost 25 pounds on her own.  We attempted to  contact your office today to discuss this, but the office phone  indicated that your facility was closed.  Since you have previously told  her that you would like her to see a surgeon, I took the liberty of  referring her to Dr. Wenda Low.  With respect to cardiovascular risk  factors, I  would like to reassess this nice woman in 6 months.    Sincerely,      [name]. Dietrich Pates, MD, [facility]  Electronically Signed    RMR/MedQ  DD: 11/06/2006  DT: 11/07/2006  Job #: [id number]

## 2010-11-19 NOTE — Procedures (Signed)
[facility]  Patient:    DEVANSHI, CALIFF Visit Number: [id number] MRN: [id number]          Service Type: EMS Location: ED Attending Physician:  Rosalyn Charters Dictated by:   Kari Baars, M.D. Proc. Date: 01/13/02 Admit Date:  01/13/2002 Discharge Date: 01/13/2002                            EKG Interpretations  DATE:  January 13, 2002, at 1138 hours.  PROCEDURE:  Electrocardiogram  PHYSICIAN:  Kari Baars, M.D.  DESCRIPTION OF PROCEDURE:  The rhythm is sinus rhythm with a rate in the 60s. There is rather diffuse T-wave flattening.  This could be nonspecific, could be from electrolyte imbalance, drug effect, or primary myocardial disease. The axis is leftward.  IMPRESSION:  Borderline electrocardiogram. Dictated by:   Kari Baars, M.D. Attending Physician:  Rosalyn Charters DD:  01/14/02 TD:  01/16/02 Job: [id number]

## 2010-11-19 NOTE — H&P (Signed)
[place]. [facility]  Patient:    [name], [name] Visit Number: [id number] MRN: [id number]          Service Type: MED Location: [phone] Attending Physician:  Anastasio Auerbach Dictated by:   Jerl Santos, M.D. Admit Date:  10/26/2001   CC:         John Giovanni, M.D., [place], [place]   History and Physical  HISTORY OF PRESENT ILLNESS:  The patient is a 60 year old lady who is a patient of John Giovanni, M.[place]., [place]  She reports no history of congestive heart failure.  Since Thursday morning, the patient has experienced increased shortness of breath, orthopnea, PND, and ankle edema.  By last night, she was having difficulty breathing and had to prop herself up in order to sleep.  She denies any definite chest pain.  She has not been having any palpitations.  There has been no syncope or presyncope. Of significance is that she was admitted in May of 2002 with chest pain and underwent cardiac catheterization by Arturo Morton. Riley Kill, M.D.  This showed an ejection fraction of 64% and no coronary artery disease.  MEDICATIONS: 1. Avandia, possibly 4 mg daily. 2. Glucophage 500 mg b.i.d. 3. Glucotrol 10 mg daily. 4. Ativan 1 mg t.i.d. 5. Synthroid 175 mcg daily. 6. Prevacid 30 mg daily. 7. Premarin 0.625 mg daily.  ALLERGIES:  She is allergic to CODEINE.  PAST MEDICAL HISTORY:  Medical Illnesses: 1. The patient has a past history of diabetes, which according to her is    apparently fairly well regulated.  It is generally in the range of 80-150.    She states that she is very compliant with her medications. 2. Morbid obesity.  The patients current weight is 325 pounds.  She indicates    that this represents a weight gain of about 50 pounds over the last six    months. 3. Hypothyroidism.  The patient is compliant with her medicines and indicates    that John Giovanni, M.D., monitors her tyroid functions closely. 4.  Gastroesophageal reflux.  This problem is stable.  PAST SURGICAL HISTORY: 1. Cholecystectomy. 2. Hysterectomy. 3. Laminectomy.  SOCIAL HISTORY:  The patient indicates that she discontinued cigarette smoking 10 years ago.  She will occasionally drink alcohol.  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  FAMILY HISTORY:  The patients grandmother, uncle, and brother all have diabetes.  REVIEW OF SYSTEMS:  Head:  She denies headache or dizziness.  Eyes:  She denies visual blurring or diplopia.  Ears, Nose, and Throat:  Denies earaches, sinus pain, or sore throat.  Chest:  See above.  Cardiovascular:  See above. GI:  Denies nausea, vomiting, abdominal pain, change in bowel habits, melena, or hematochezia.  GU:  Denies dysuria, urinary frequency, hesitancy, or nocturia.  Rheumatologic:  Denies back pain or joint pain.  Hematologic: Denies easy bleeding or bruising.  Neurologic:  Denies history of seizure or stroke.  PHYSICAL EXAMINATION:  The patient is an obese lady who is wearing oxygen.  HEENT:  Within normal limits.  CHEST:  Remarkable for rales about halfway up.  There are no wheezes or rhonchi.  CARDIOVASCULAR:  An irregular heart rhythm.  There is what sounds like a gallop rhythm.  ABDOMEN:  Obese.  Normal bowel sounds without masses, tenderness, or organomegaly.  NEUROLOGIC:  Testing is within normal limits.  EXTREMITIES:  2+ pretibial edema.  LABORATORY DATA:  The EKG shows occasional  PACs versus atrial fibrillation.  I think that this needs to be repeated, as well as telemetry monitoring being carried out.  The glucose was 253, sodium 139, and potassium 4.1.  The hemoglobin was 14. Bicarbonate was within normal limits.  The creatinine was 0.4.  Other studies are pending.  IMPRESSION: 1. Congestive heart failure. 2. Diabetes. 3. Morbid obesity. 4. Hypothyroidism. 5. Status post cholecystectomy and laminectomy.  PLAN:  The patient will  be admitted to the hospital for evaluation of this problem.  Will administer Lasix and oxygen.  A 2-D echocardiogram will be obtained.  I am reluctant to start beta blockers and ACE inhibitors in large doses without first evaluating the ejection fraction since her ejection fraction was very normal on the previous catheterization.  It is conceivable that the patient has experienced an ischemic event in the recent past. Possibly she would benefit from a stress test during her hospitalization.  I am also going to hold Avandia in case this may be contributing to fluid retention and manage her diabetes with just a sliding scale insulin regimen. We will follow her closely. Dictated by:   Jerl Santos, M.D. Attending Physician:  Anastasio Auerbach DD:  10/27/01 TD:  10/28/01 Job: [id number]

## 2010-11-19 NOTE — Discharge Summary (Signed)
[place]. [facility]  Patient:    [name], [name]                          MRN: [id number] Adm. Date:  11/04/00 Disc. Date: 11/07/00 Attending:  Arturo Morton. Riley Kill, M.D. [facility] Dictator:   Jacolyn Reedy, P.A.-C. CC:         Gareth Morgan, M.D. - [address], [place], [place] [address]   Discharge Summary  ADMISSION DIAGNOSIS:  Chest pain.  DISCHARGE DIAGNOSES: 1. Chest pain, question etiology, a combination of musculoskeletal    and gastrointestinal. 2. Normal coronary arteries and left ventricular function on cardiac    catheterization. 3. V/Q scan, low probability. 4. Nodular opacity right middle lung lobe, to be followed up by    Dr. Gareth Morgan. 5. Non-insulin-dependent diabetes mellitus. 6. _______ replacement therapy. 7. Anemia, to be seen by Dr. Gerilyn Nestle. 8. Chronic back pain, status post prior back surgery. 9. Morbid obesity.  HISTORY OF PRESENT ILLNESS:  Please see the H&P for the details.  This is a 60 year old white female patient of [name] who came to the emergency room because of recurrent chest pain.  The pain was worse on inspiration, and she has been short of breath.  She was also tender to palpation, and this reproduced the pain.  Her soreness progressed, but then she also described residual tightness, and she was admitted to rule out a myocardial infarction.  HOSPITAL COURSE:  The patient ruled out with negative CPK, MB, and electrocardiograms.  The D-Dimer was up so she had a CT scan that showed a nodular opacity at the anterior right middle lobe, probably representing an area of atelectasis or scarring, but followup is recommended.  There was no evidence of deep vein thrombosis, and a low probability of a pulmonary embolus.  The patient underwent a cardiac catheterization on Nov 06, 2000, by Dr. Arturo Morton. Stuckey which revealed normal coronary arteries and LV function. He said mitral annular calcification.  The right groin was stable  without hematoma or hemorrhage, and she had good distal pulses.  DISPOSITION:  She is discharged home in stable condition.  LABORATORY DATA:  Hemoglobin 10.9, hematocrit 33, sodium 141, potassium 3.5, chloride 106, CO2 of 27, BUN 11, creatinine 0.7.  Coags normal.  CPK, MB, and troponins negative x 3.  TSH 1.144.  Iron 31, TIBC 351, 9% saturation, ferritin 8, B12 normal at 367.  Chest x-ray:  Cardiomegaly, pulmonary vascular congestion without definite edema, query mild left basilar atelectasis.  V/Q scan:  Low probability for pulmonary embolus.  CT:  Please see above dictation and report.  No definite evidence of pulmonary embolus, but somewhat limited secondary to the patients large body habitus and motion.  Scattered diffuse areas of ground glass opacity within the lungs, primarily the mid and lower lungs.  Areas of more normal-appearing aerated lungs are noted in this pattern, probably representing small vessel, small airway disease.  Cardiomegaly.  Nodular opacity at the anterior right middle lobe, probably representing an area of atelectasis or scarring, but followup in this region is recommended.  No evidence of deep vein thrombosis.  DISCHARGE MEDICATIONS: 1. Avandia 8 mg one q.d. 2. Glucophage 500 mg b.i.d., to begin on Nov 08, 2000. 3. Glucotrol 5 mg one q.d. 4. Ativan 1 mg t.i.d. 5. Synthroid 175 mcg q.d. 6. Premarin 0.625 mg q.d. 7. Prevacid 30 mg b.i.d. 8. Bumex 1 mg q.d. p.r.n. 9. Skelaxin 400 mg two  tablets t.i.d.  INSTRUCTIONS:  She is to do no heavy lifting or strenuous activity for two to three days.  Follow a low-fat, diabetic diet..  FOLLOWUP:  She is to see Dr. [name] tomorrow at 2 p.m., to follow up her lung lesion, and she will see Dr. Gerilyn Nestle about her iron deficiency anemia.   1. DD:  11/07/00 TD:  11/07/00 Job: [id number]
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
## 2010-11-19 NOTE — Discharge Summary (Signed)
[name], [name]                 ACCOUNT NO.:  [id number]   MEDICAL RECORD NO.:  [id number]          PATIENT TYPE:  OBV   LOCATION:  3715                         FACILITY:  [facility]   PHYSICIAN:  Tara Beals. Juanda Chance, MD, FACCDATE OF BIRTH:  03-Apr-1951   DATE OF ADMISSION:  10/16/2006  DATE OF DISCHARGE:  10/17/2006                               DISCHARGE SUMMARY   PRIMARY CARDIOLOGIST:  Tara Friends. Dietrich Pates, MD, [facility]   PRIMARY CARE PHYSICIAN:  [name], M.D. in [place].   PRINCIPAL DIAGNOSIS:  Atypical chest pain.   SECONDARY DIAGNOSES:  1. Nonobstructive coronary artery disease by cardiac catheterization      in November of 2002.  2. Type 2 diabetes mellitus.  3. Hypertension.  4. Hyperlipidemia.  5. Morbid obesity.  6. Irritable bowel syndrome.  7. History of diastolic congestive heart failure.  8. Hypothyroidism.  9. Sleep apnea with CPAP noncompliance.  10.Iron-deficiency anemia.  11.History of small right lung nodule in 1992.  12.Status post cholecystectomy.  13.Status post hysterectomy.  14.History of carpal tunnel syndrome.  15.Status post L4-L5 surgery.  16.Small hiatal hernia by CT on this admission.   ALLERGIES:  CODEINE causes hallucinations.   PROCEDURES:  Chest CT.   HISTORY OF PRESENT ILLNESS:  A 60 year old Caucasian female with a prior  history of non-obstructive coronary artery disease who was in her usual  state of health until a few days prior to admission when she began to  experience nausea with intermittent stabbing and epigastric/lower  substernal chest discomfort.  She had worsening of symptoms on October 16, 2006 while getting ready for work, which had diminished some by the time  she had arrived at work here at [employer] in the emergency room.  Because of ongoing symptoms, coworkers advised her to be seen in the ER.  Her cardiac markers were negative; however, her D-dimer was elevated at  0.57, and CT of the chest was performed and was  negative for pulmonary  embolism but showed a small hiatal hernia.  She was admitted for further  evaluation.   [name] COURSE:  [name] ruled out for MI by cardiac markers x3, and  ECG was without any acute changes.  She did have a recurrence of her  chest discomfort upon laying down after eating her evening meal last  night.  This was promptly resolved by sitting up, as well as with  Mylanta.  Secondary to poorly controlled type 2 diabetes mellitus with a  sugar at 352 on admission and a hemoglobin A1c of 9.4, she was seen by  [name].  They recommended continuation of her home  Lantus dosing, as well as Metformin, glipizide and sliding scale  insulin.  [name] will be discharged home today in satisfactory  condition.  We have arranged for follow up Myoview, given her risk  factors of hypertension, hyperlipidemia, diabetes and obesity, as well  as follow up with [name].  She plans to follow up with [name]  for outpatient GI referral.   DISCHARGE LABORATORIES:  Hemoglobin 13.3, hematocrit 39.5, WBC 10.6,  platelets of 282.  PT of 12.4, INR of 0.9, PTT 23, D-dimer 0.57.  Sodium  137, potassium 3.8, chloride 100, CO2 of 24, BUN of 14, creatinine of  0.78, glucose 352, total bilirubin 0.7, alkaline phosphatase 65, AST 26,  ALT 21, total protein 6.3, albumin 3.3, calcium 8.8, amylase 50.  Hemoglobin A1c 9.4.  Lipase 22.  CK of 109, MB 2.7, troponin I 0.04.  Total cholesterol of 170, triglycerides 320, HDL 32, LDL 74.   DISPOSITION:  The patient is being discharged home today in good  condition.   FOLLOWUP:  1. She is scheduled for an adenosine Myoview at the [name]      [facility] office on October 24, 2006 at 12:30 p.m.  2. She is to follow up with [name] on Nov 06, 2006 at 3      p.m.  3. She is to follow up with [name] in [place] in 1-2      weeks.   DISCHARGE MEDICATIONS:  1. Metformin 1000 mg b.i.d.  2.  Omeprazole 20 mg b.i.d.  3. Lantus 50 units q.h.s.  4. Glipizide 10 mg daily.  5. Altace 10 mg daily.  6. Sliding scale insulin a.c. and h.s.  7. Vytorin 10/80 mg q.p.m.  8. Cymbalta as previously prescribed.  9. Aspirin 81 mg daily.  10.Synthroid 170 mcg daily.  11.Skelaxin as previously prescribed.  12.Lorazepam 1 mg b.i.d. to t.i.d. p.r.n.  13.Nitroglycerin 0.4 mg sublingual p.r.n. chest pain.  14.Reglan 10 mg q.i.d. a.c. and h.s.   OUTSTANDING LABORATORY AND STUDIES:  None.   DURATION OF DISCHARGE ENCOUNTER:  Fifty minutes including physician  time.      [name]      Tara R. Juanda Chance, MD, [facility]  Electronically Signed    CB/MEDQ  D:  10/17/2006  T:  10/17/2006  Job:  [id number]   cc:   [name], M.D.

## 2010-11-19 NOTE — Op Note (Signed)
[name], [name]                 ACCOUNT NO.:  [id number]   MEDICAL RECORD NO.:  [id number]          PATIENT TYPE:  AMB   LOCATION:  DAY                           FACILITY:  [facility]   PHYSICIAN:  Lionel December, M.D.    DATE OF BIRTH:  February 21, 1951   DATE OF PROCEDURE:  11/24/2006  DATE OF DISCHARGE:                               OPERATIVE REPORT   PROCEDURE:  Esophagogastroduodenoscopy with esophageal dilation.   INDICATION:  [name] is a 60 year old Caucasian female with chronic GERD  who has been maintained on PPI, was recently hospitalized for atypical  chest pain and ruled out for infarct in her Myoview adenosine stress  test was negative.  She also has history of short segment Barrett's and  complains of dysphagia.  She is therefore undergoing  diagnostic/therapeutic procedure.  Procedure risks were reviewed with  the patient and informed consent was obtained.   MEDICATIONS FOR CONSCIOUS SEDATION:  Benzocaine spray for pharyngeal  topical anesthesia, Demerol 50 mg IV, Versed 12 mg IV in divided dose.   FINDINGS:  Procedure performed in endoscopy suite.  The patient's vital  signs and O2 sats were monitored during procedure and remained stable.  The patient was placed left lateral recumbent position and Pentax  videoscope was passed per oropharynx without any difficulty into  esophagus.   Mucosa of the esophagus was normal.  GE junction was at 39 cm and hiatus  was at 41 cm from the incisors.  She had two tongues of salmon-colored  mucosa one at six o'clock another one around 11:00.  There was a much  shorter tongue between two and 3 o'clock.  The longest tongue was no  more than 10 or 12 mm.  Pictures taken for the record.  There was no  stricture or ring noted.   Stomach:  It was empty and distended very well with insufflation.  Folds  of proximal stomach were normal.  Examination of mucosa revealed  multiple antral erosions.  No ulcer was noted.  Pyloric channel was  patent.   Angularis, fundus and cardia examined by retroflexing the scope  were normal.   Duodenum:  Bulbar mucosa was normal.  Scope was passed in second part of  the duodenum where mucosa and folds were normal.   Endoscope was withdrawn.   Esophagus was dilated by passing 56-French Maloney dilator to full  insertion.  As the dilator was withdrawn, esophageal mucosa was  reexamined.  There was a short linear tear at cervical esophagus just  below UES possibly indicative of web.  On the way out, biopsy was taken  from these patches of Barrett's mucosa and submitted in one container.  The patient tolerated the procedure well.   FINAL DIAGNOSIS:  1. Three short tongues of Barrett's mucosa of which was biopsied for      histology.  2. Small sliding hiatal hernia at the gastroesophageal junction at 39      cm, hiatus at 41.  3. Erosive antral gastritis.  4. Esophagus was dilated by passing 56-French Maloney dilator with      small linear  tear in cervical esophagus possibly indicative of web.   RECOMMENDATIONS:  She will continue antireflux measures and omeprazole  at 20 mg b.i.d.  We will check her H. pylori serology today.   I will be contacting patient with results of biopsy and blood test.      Lionel December, M.D.  Electronically Signed     NR/MEDQ  D:  11/24/2006  T:  11/24/2006  Job:  [id number]   cc:   [name], M.D.  Fax: [phone]

## 2010-12-09 ENCOUNTER — Encounter: Payer: BC Managed Care – PPO | Admitting: *Deleted

## 2010-12-13 ENCOUNTER — Ambulatory Visit (INDEPENDENT_AMBULATORY_CARE_PROVIDER_SITE_OTHER): Payer: BC Managed Care – PPO | Admitting: *Deleted

## 2010-12-13 DIAGNOSIS — I4891 Unspecified atrial fibrillation: Secondary | ICD-10-CM

## 2010-12-29 IMAGING — CR DG CHEST 1V PORT
1 series · 1 of 1 positions shown · non-contrast
Comparison: 08/05/2007

CLINICAL DATA: Chest pain and palpitations.

PORTABLE CHEST - 1 VIEW

[view not recorded]
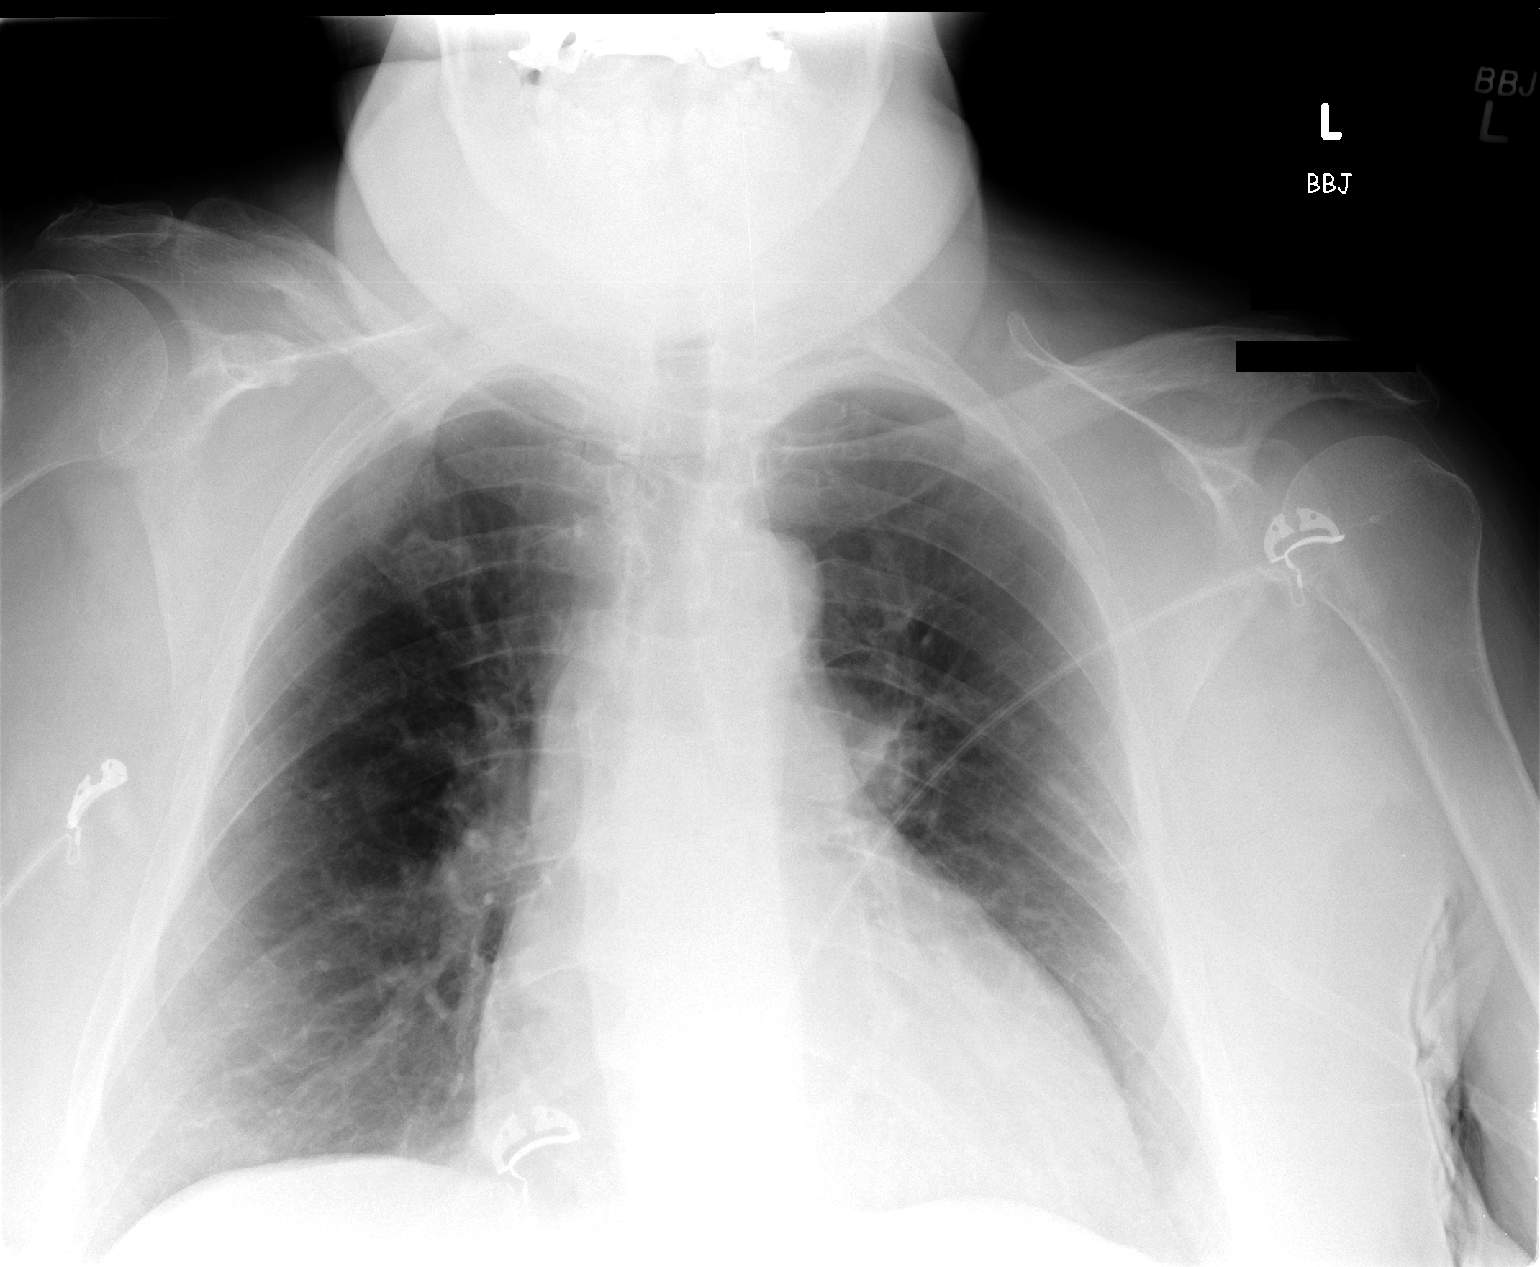

[1 of 1 positions shown; findings below may reference images not displayed]

FINDINGS: The heart is enlarged but stable.  There are chronic lung
changes but no definite acute overlying pulmonary process.  The
bony thorax is intact.
IMPRESSION: 1.  Mild cardiac enlargement, stable.
2.  Chronic lung changes but no acute overlying pulmonary process.

## 2011-01-07 IMAGING — CR DG LUMBAR SPINE COMPLETE 4+V
5 series · 5 of 5 positions shown · non-contrast
Comparison: CT 01/12/2007.

CLINICAL DATA: Back pain.

LUMBAR SPINE - COMPLETE 4+ VIEW

[view not recorded (1 of 5)]
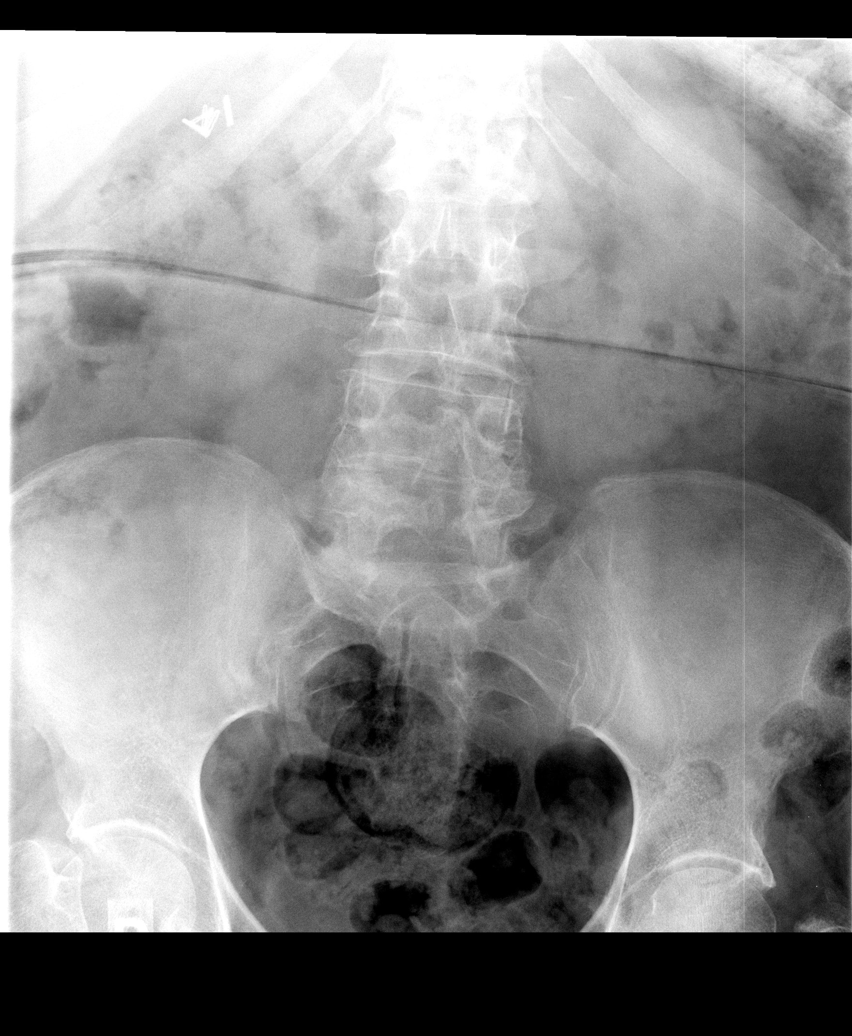

[view not recorded (2 of 5)]
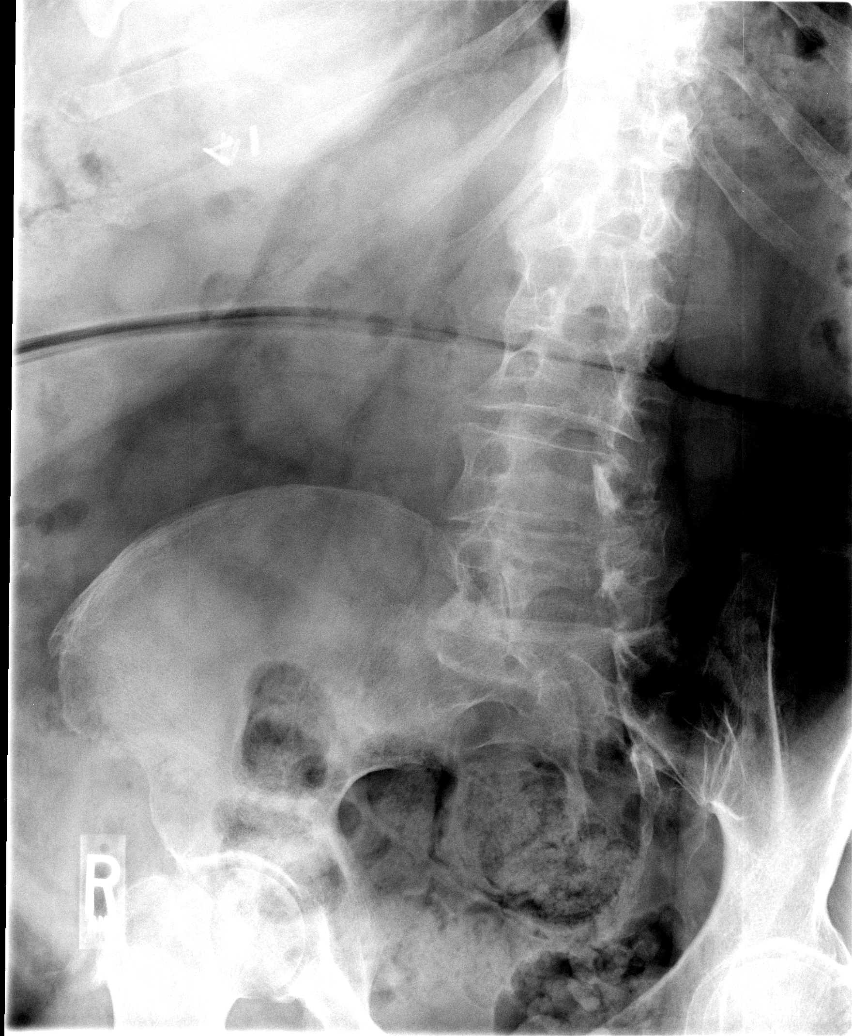

[view not recorded (3 of 5)]
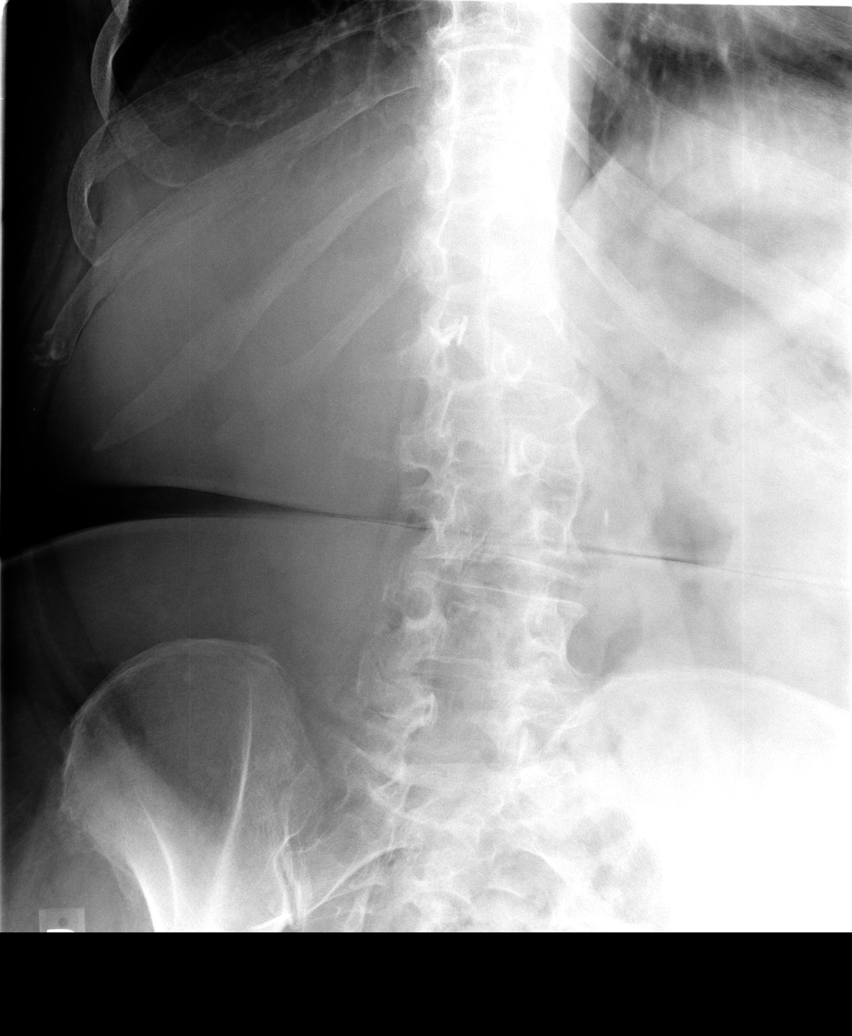

[view not recorded (4 of 5)]
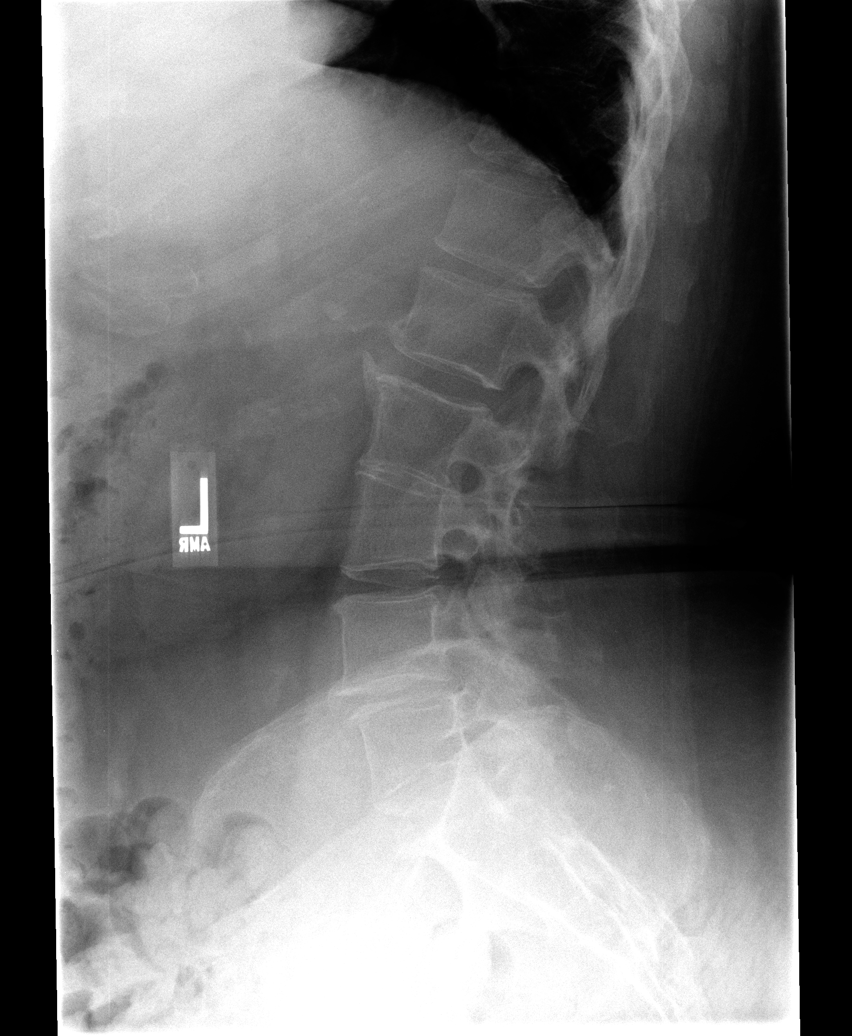

[view not recorded (5 of 5)]
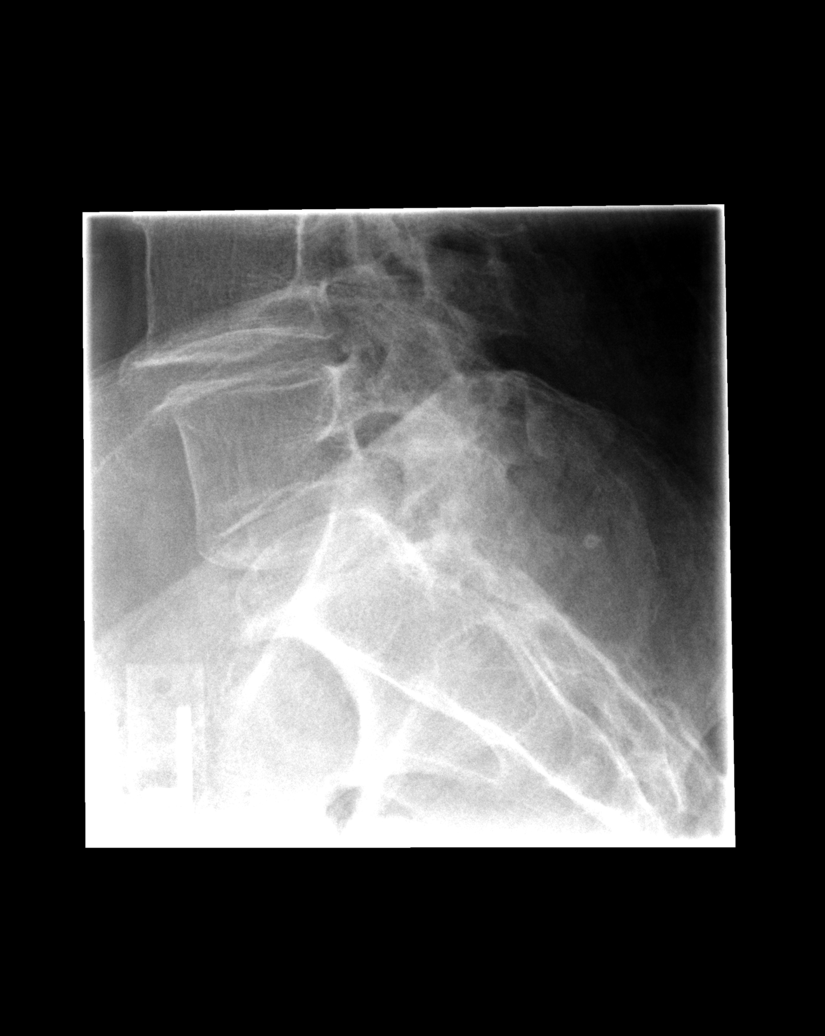

[5 of 5 positions shown; findings below may reference images not displayed]

FINDINGS: Congenital fusion L2 and L3.  L3-4, L4-5 and L5-S1
bilateral facet joint degenerative changes.  L4-5 disc space
narrowing.
IMPRESSION: Congenital fusion L2 and L3.

L4-5 disc space narrowing.

L3-4, L4-5 and L5-S1 bilateral facet joint degenerative changes.

## 2011-01-07 IMAGING — MR MR LUMBAR SPINE W/O CM
6 of 12 series · 24 of 48 positions shown · non-contrast
Comparison: None available.

CLINICAL DATA: Chronic low back pain radiating into both legs.
History lumbar surgery.

MRI LUMBAR SPINE WITHOUT CONTRAST
TECHNIQUE: Multiplanar and multiecho pulse sequences of the lumbar
spine were obtained without intravenous contrast.

[Series 3: T2 · sagittal · 4.0mm · 0.94mm/px · 3 of 17 slices shown (1 of 4)]
[im 1/17]
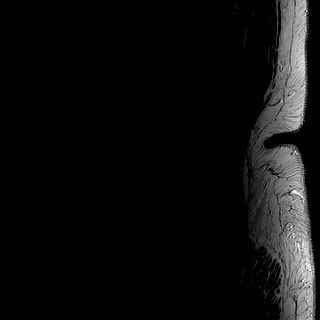
[im 9/17]
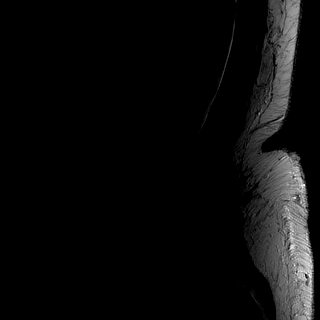
[im 17/17]
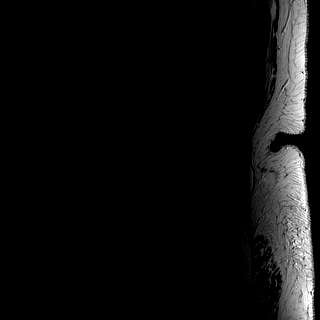

[Series 4: T1 · sagittal · 4.0mm · 0.47mm/px · 3 of 17 slices shown (1 of 2)]
[im 1/17]
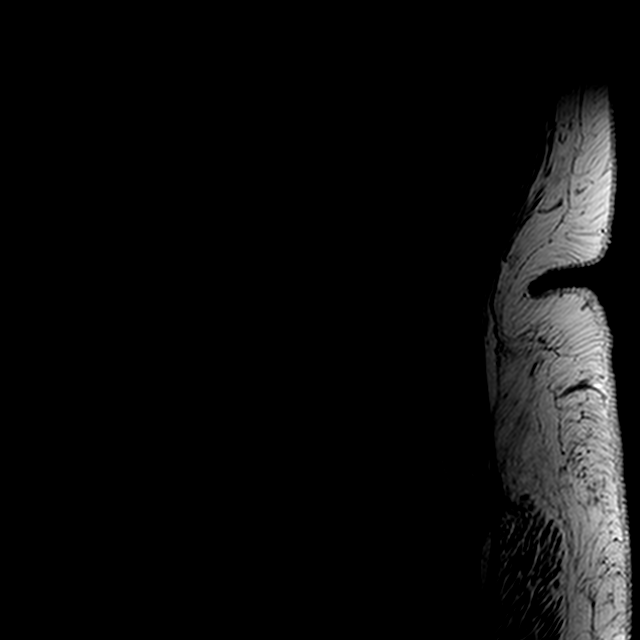
[im 9/17]
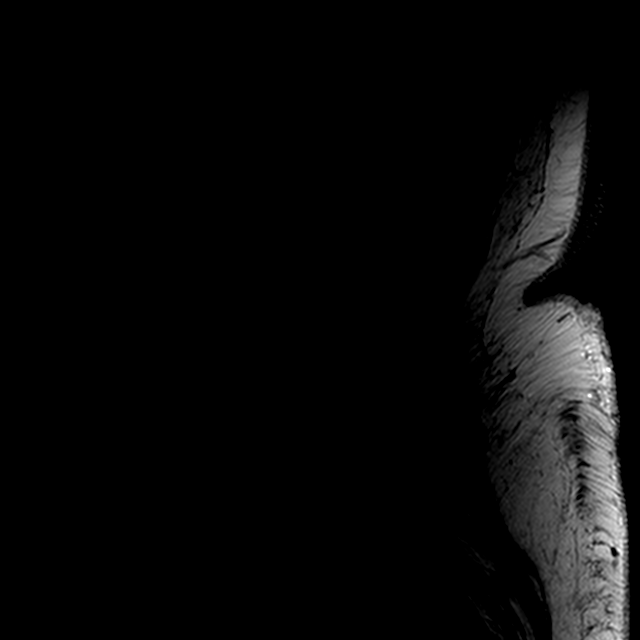
[im 17/17]
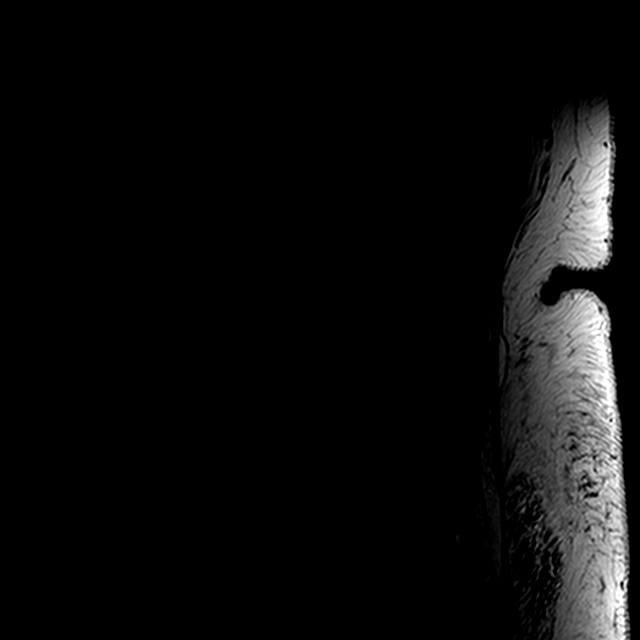

[Series 7: T2 · axial · 4.0mm · 0.54mm/px · z∈[-18,+64]mm · 5 of 20 slices shown (2 of 4)]
[im 1/20]
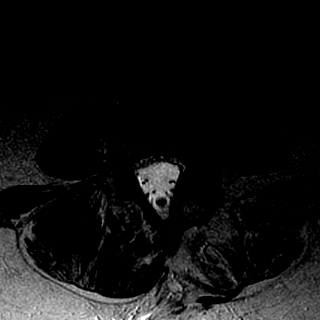
[im 5/20]
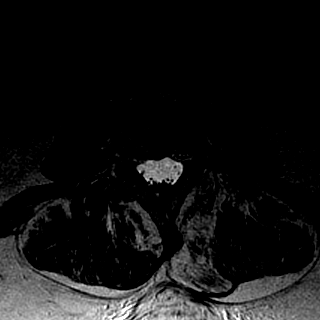
[im 10/20]
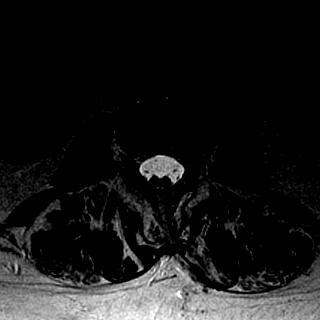
[im 15/20]
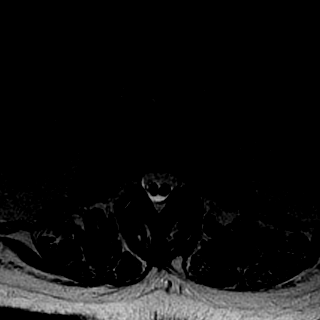
[im 20/20]
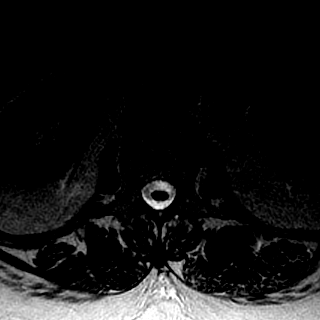

[Series 8: T1 · axial · 4.0mm · 0.27mm/px · z∈[-18,+64]mm · 5 of 20 slices shown (2 of 2)]
[im 1/20]
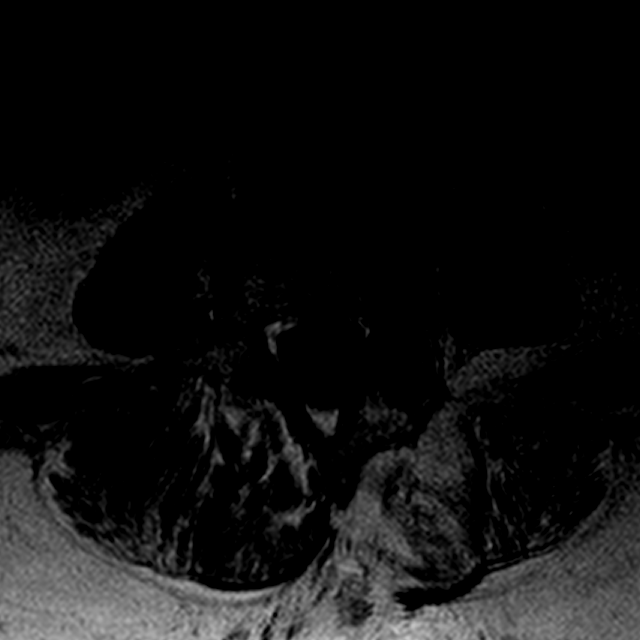
[im 5/20]
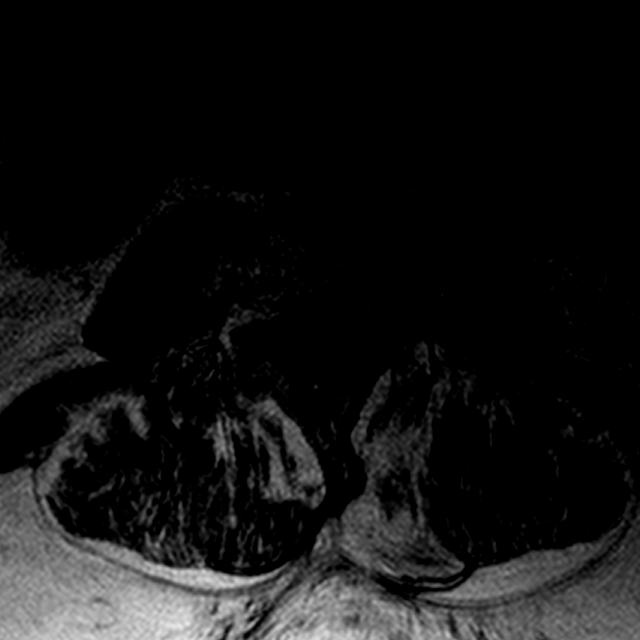
[im 10/20]
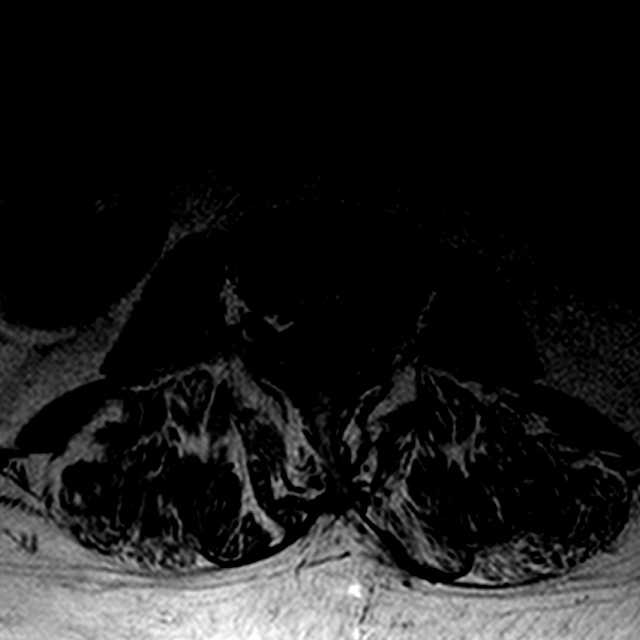
[im 15/20]
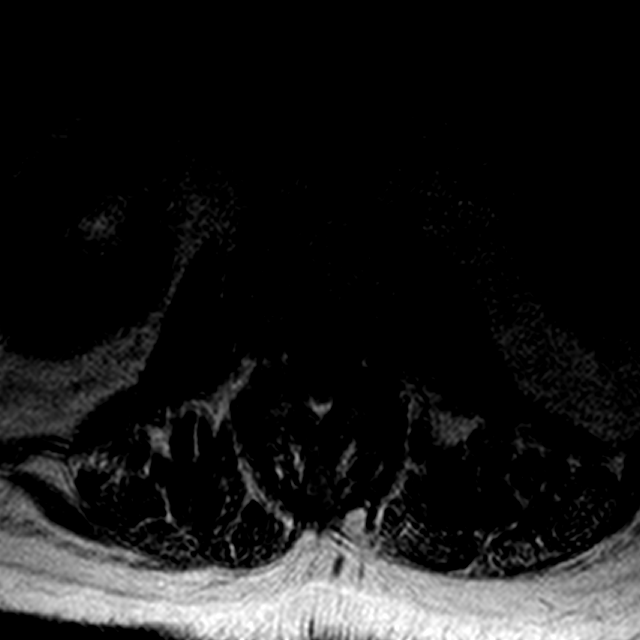
[im 20/20]
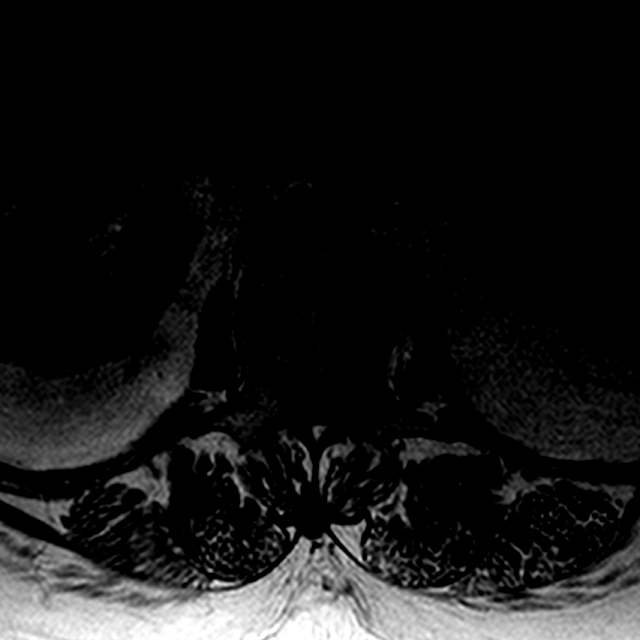

[Series 9: T2 · axial · 4.0mm · 0.54mm/px · z∈[-118,-15]mm · 4 of 15 slices shown (3 of 4)]
[im 1/15]
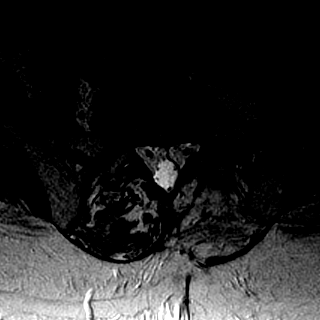
[im 5/15]
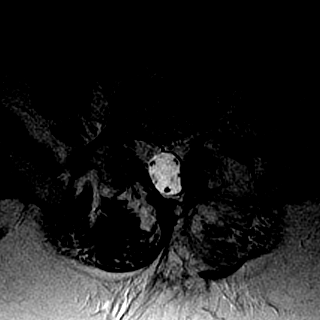
[im 10/15]
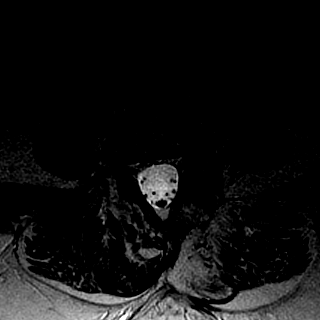
[im 15/15]
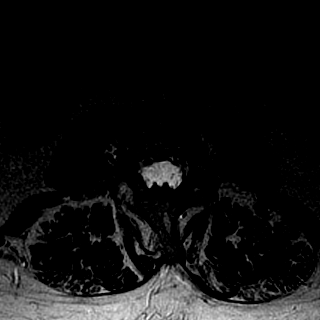

[Series 11: T2 · sagittal · 4.0mm · 0.94mm/px · 4 of 15 slices shown (4 of 4)]
[im 1/15]
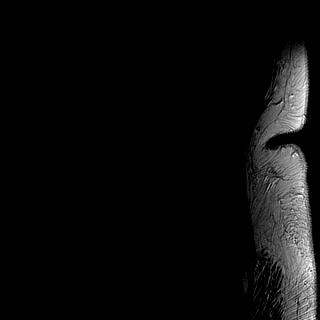
[im 5/15]
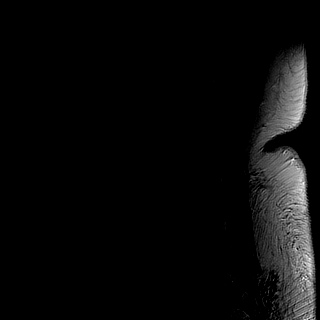
[im 10/15]
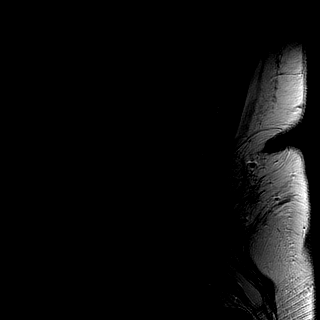
[im 15/15]
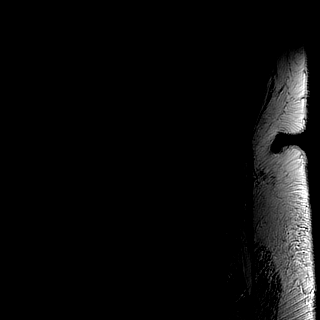

[24 of 48 positions shown; findings below may reference images not displayed]

FINDINGS: There is partial fusion of L2 and L3 involving the
vertebral bodies anteriorly as well as the posterior elements.
This appears congenital.  There is a low-lying cord with the conus
extending to the level of L5-S1.  There appears been previous
surgery at the sacral level.  There is atrophy of the paraspinous
musculature on the left.  There is a division of the cord beginning
at the level of the L1 inferior endplate and extending to the
superior endplate of L3 compatible with diastematomyelia.  No
definite to osseous or fibrous bar is identified.

T12-L1:  Mild disc bulge is present without significant stenosis.

L1-2:  There is mild facet hypertrophy, left worse than right
without significant stenosis.

L2-3:  This is the fused level.  There is mild osseous foraminal
narrowing which appears congenital.

L3-4:  Moderate facet hypertrophy is present.  There is mild disc
bulging.  This leads to moderate left and mild right foraminal
stenosis.

L4-5:  Facet hypertrophy is advanced on the left and moderate on
the right.  This leads to moderate left foraminal stenosis in mild
right foraminal stenosis.

L5-S1:  Mild disc bulging and advanced facet hypertrophy is
present.  There is left greater than right foraminal stenosis.
IMPRESSION: 1. Tethered cord with extension of the cord to at least the L5-S1
level.
2.  Congenital fusion of the L2 and L3 vertebral bodies and
posterior elements.
3.  Advanced facet hypertrophy and left greater than right
foraminal stenosis at L3-4.
4.  Left greater than right foraminal stenosis at L4-5 secondary to
facet disease.
5.  Mild foraminal stenosis bilaterally at L5-S1 due to facet
disease.
6.  Diastematomyelia extending from the inferior endplate of L1 to
the superior endplate of L3.  No osseous or fibrous division is
identified.

## 2011-01-10 ENCOUNTER — Ambulatory Visit (INDEPENDENT_AMBULATORY_CARE_PROVIDER_SITE_OTHER): Payer: BC Managed Care – PPO | Admitting: *Deleted

## 2011-01-10 DIAGNOSIS — I4891 Unspecified atrial fibrillation: Secondary | ICD-10-CM

## 2011-02-03 IMAGING — CT CT L SPINE W/ CM
2 of 4 series · 4 of 14 positions shown, 5 images · IV contrast (omnipaque)
Comparison: Lumbar MRI 08/22/2008

MYELOGRAM  INJECTION
TECHNIQUE: Informed consent was obtained from the patient prior to
the procedure, including potential complications of headache,
allergy, infection and pain. Specific instructions were given
regarding 24 hour bedrest post procedure to prevent post-LP
headache.  A timeout procedure was performed.  With the patient
prone, the lower back was prepped with Betadine.  1% Lidocaine was
used for local anesthesia.  Lumbar puncture was performed by the
radiologist at the L4-5 level using a 22 gauge needle with return
of clear CSF.  Care was taken to avoid the low tethered conus. 10
cc of Omnipaque 300 was injected into the subarachnoid space .
CLINICAL DATA: Chronic low back pain extending into both legs.
Neck pain going into both arms.
TECHNIQUE: Multidetector CT imaging of the lumbar spine was
performed following myelography.  Multiplanar CT image
reconstructions were also generated.
TECHNIQUE: Multidetector CT imaging of the cervical spine was

[Series 2: cervical spine · axial · 0.27mm/px · z∈[-183,-121]mm · 2 of 145 slices shown, 3 images]
[im 49/145  soft-tissue]
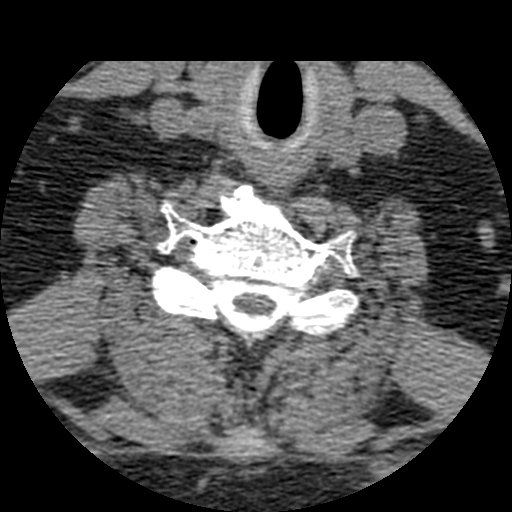
[im 49/145  bone]
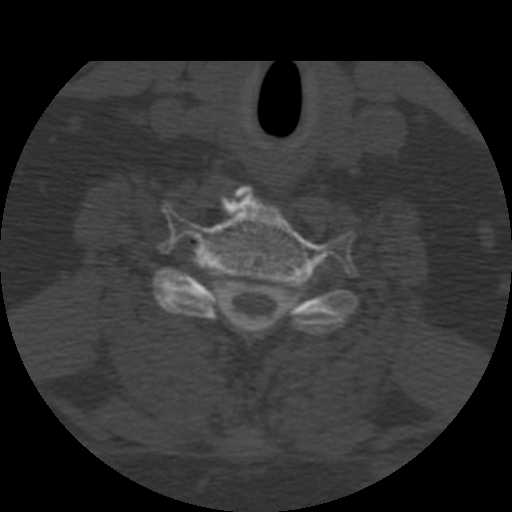
[im 97/145  bone]
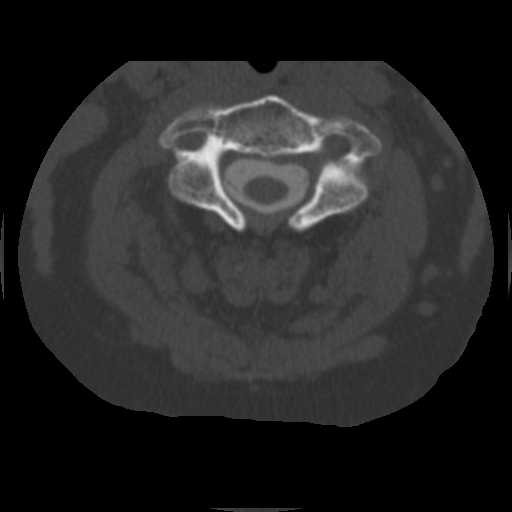

[Series 3: bone windows · axial · 0.27mm/px · z∈[-183,-122]mm · 2 of 147 slices shown]
[im 49/147  bone]
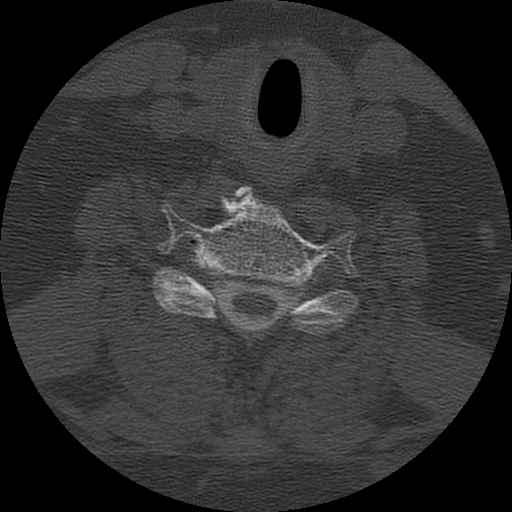
[im 98/147  bone]
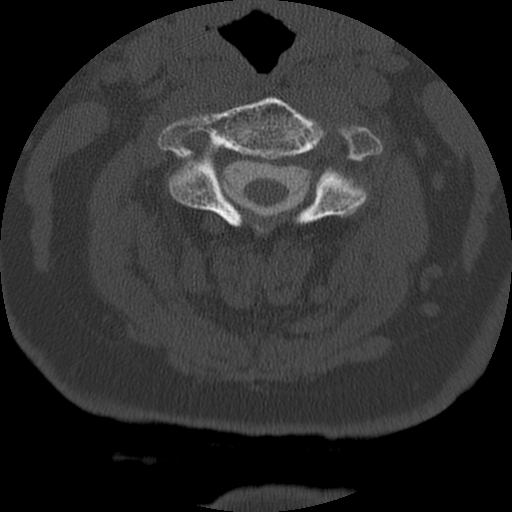

[4 of 14 positions shown; findings below may reference images not displayed]

IMPRESSION: Successful injection of  intrathecal contrast for myelography.

MYELOGRAM LUMBAR
FINDINGS: Postsurgical changes at L5 status post laminectomy.  No
nerve root cut off or spinal stenosis.  Congenital fusion at L2-3.
Thickened filum terminale.

Fluoroscopy Time: 3.50 minutes
IMPRESSION: As  above

CT MYELOGRAPHY LUMBAR SPINE
FINDINGS: No prevertebral or paraspinous masses.  Supine and prone
scanning was employed to better opacify the subarachnoid space.

L1-2: Mild bulge.  No stenosis or disc protrusion. Conus low.

L2-3: Congenital fusion.  Fibrous diastomatomyelia with hemicords
of equal size on the right and left. No disc pathology.

L3-4: No disc pathology.  Mild spina bifida.  Mild facet
arthropathy. Bilateral foraminal narrowing, left greater than
right. No spinal stenosis or L4 nerve root encroachment, but left
L3 nerve root encroachment may be present. Tethered cord dorsally.

L4-5: Small hemilaminotomy defect on the left.  Left greater than
right facet arthropathy.  Disc space narrowing. Tethered cord
dorsally with thickened filum terminale below this.  Asymmetric
lateral recess encroachment and foraminal narrowing on the left.
No significant disc bulge or protrusion. No definite L5 nerve root
encroachment.  Left L4  nerve root encroachment may be present.

L5-S1: Broad-based central protrusion.  Right greater than left
facet arthropathy.  No definite S1 nerve root encroachment.
Thickened filum terminale inserts dorsally.

There is overall good general agreement compared with prior MR.
Foraminal narrowing appears to be a combination of bone and soft
tissue; there is no visible calcific or bony spike associated with
the split cord.
IMPRESSION: Diastomatomyelia centered at L2-3.

Asymmetric foraminal narrowing, possibly compressing exiting nerve
roots, at L3-4, and L4-5, left.

Central protrusion at L5-S1, non compressive.

Tethered cord below the split cord with thickened filum terminale.

Congenital fusion L2 and L3

MYELOGRAM CERVICAL
FINDINGS: Contrast was maneuvered into the cervical right region
following lumbar myelography.  There is a prominent ventral defect
at C4-5.  Significant disc space narrowing is present C5-6 and C6-
7.  Suggestion of right C6 nerve root encroachment is noted.
IMPRESSION: As above

CT MYELOGRAPHY CERVICAL SPINE
The individual disc spaces were examined as follows:

C2-3:  Normal.

C3-4:  Mild bulge.  No stenosis or disc protrusion.

C4-5:  Central and leftward disc protrusion.  Mild effacement left
C5 nerve root.

C5-6:  Advanced disc space narrowing.  Calcified central protrusion
with bilateral uncinate spurring, right greater than left.
Bilateral C6 nerve root encroachment is present worse on the right.

C6-7:  Shallow central protrusion, non compressive.  No definite C7
nerve root encroachment.

C7-T1:  No disc pathology.

Far right and left parasagittal images demonstrate lower cervical
facet arthropathy, worst at C5-6 and C6-7. There is no evidence for
tonsillar herniation or syringomyelia.
IMPRESSION: Multilevel spondylosis, worst from C4-C7; see comments above

## 2011-02-03 IMAGING — RF IR MYELOGRAM [PERSON_NAME]
12 of 22 series · 12 of 22 positions shown · IV contrast (omnipaque)
Comparison: Lumbar MRI 08/22/2008

MYELOGRAM  INJECTION
TECHNIQUE: Informed consent was obtained from the patient prior to
the procedure, including potential complications of headache,
allergy, infection and pain. Specific instructions were given
regarding 24 hour bedrest post procedure to prevent post-LP
headache.  A timeout procedure was performed.  With the patient
prone, the lower back was prepped with Betadine.  1% Lidocaine was
used for local anesthesia.  Lumbar puncture was performed by the
radiologist at the L4-5 level using a 22 gauge needle with return
of clear CSF.  Care was taken to avoid the low tethered conus. 10
cc of Omnipaque 300 was injected into the subarachnoid space .
CLINICAL DATA: Chronic low back pain extending into both legs.
Neck pain going into both arms.
TECHNIQUE: Multidetector CT imaging of the lumbar spine was
performed following myelography.  Multiplanar CT image
reconstructions were also generated.
TECHNIQUE: Multidetector CT imaging of the cervical spine was

[Series 1: myelogram  white · 1 of 1 slices shown (1 of 12)]
[im 1/1]
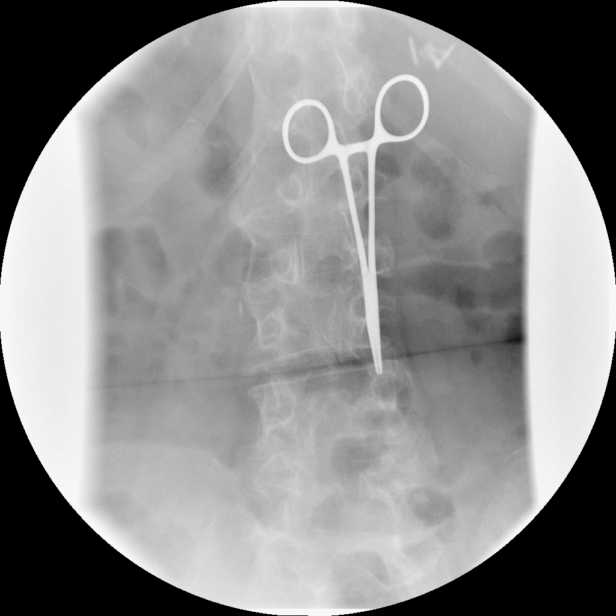

[Series 8: myelogram  white · 1 of 1 slices shown (2 of 12)]
[im 1/1]
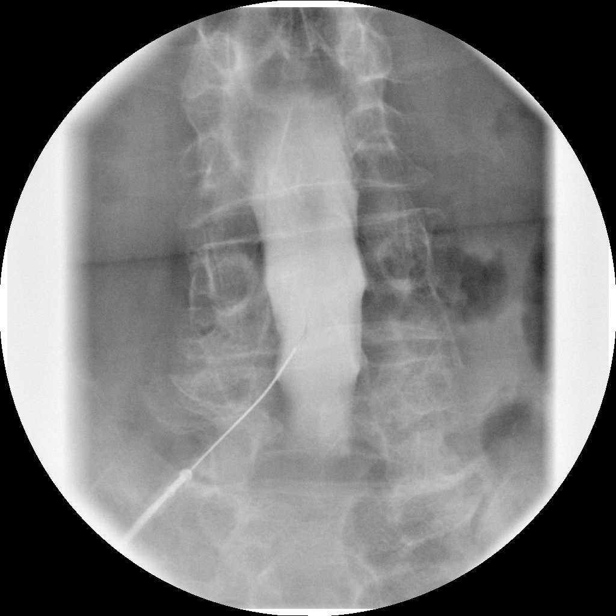

[Series 10: myelogram  white · 1 of 1 slices shown (3 of 12)]
[im 1/1]
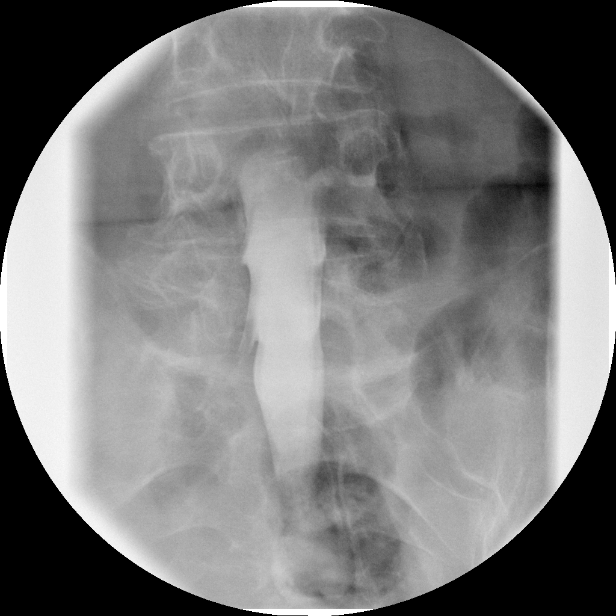

[Series 12: myelogram  white · 1 of 1 slices shown (4 of 12)]
[im 1/1]
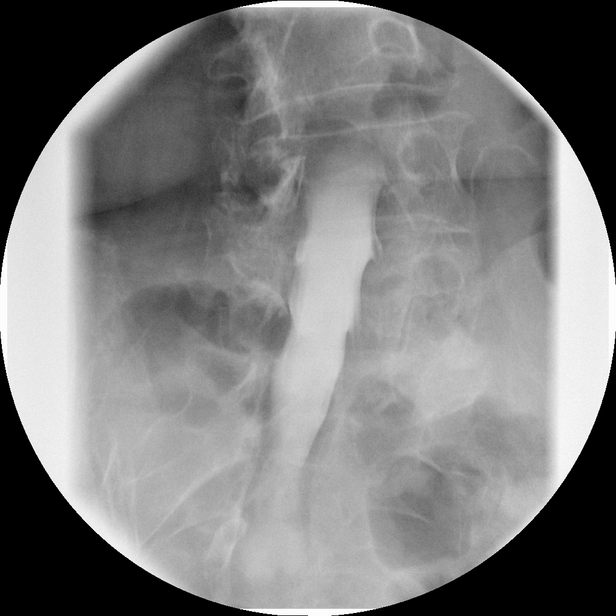

[Series 14: myelogram  white · 1 of 1 slices shown (5 of 12)]
[im 1/1]
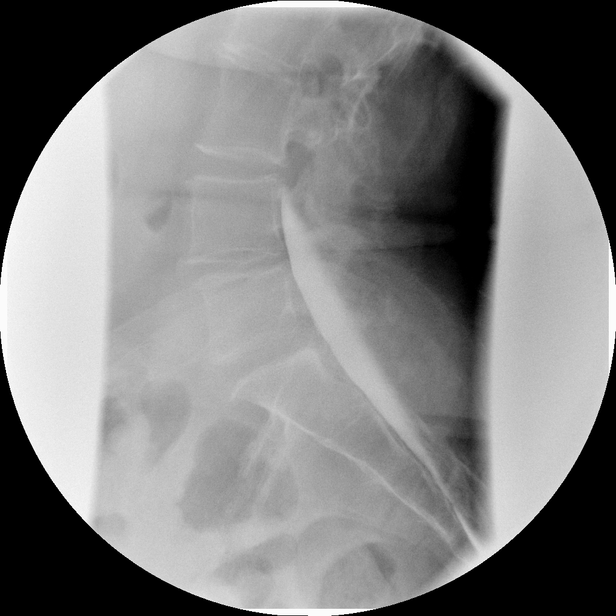

[Series 16: myelogram  white · 1 of 1 slices shown (6 of 12)]
[im 1/1]
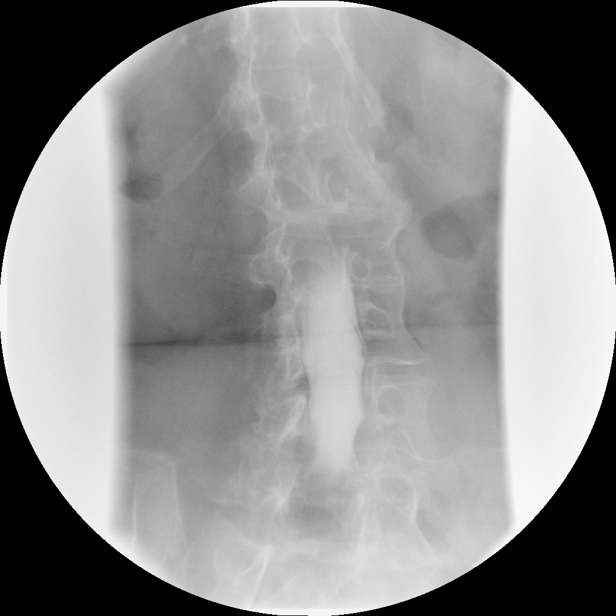

[Series 17: myelogram  white · 1 of 1 slices shown (7 of 12)]
[im 1/1]
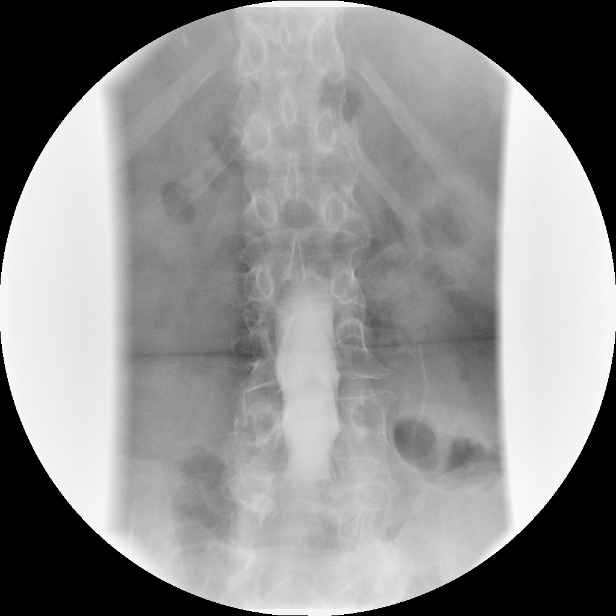

[Series 19: myelogram  white · 1 of 1 slices shown (8 of 12)]
[im 1/1]
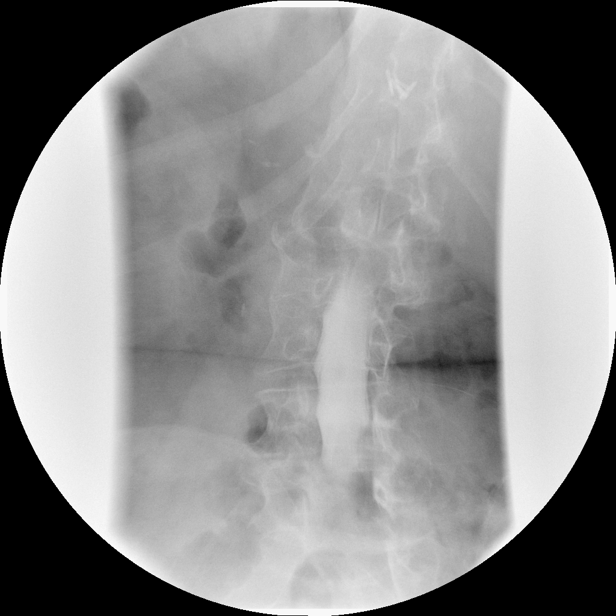

[Series 21: myelogram  white · 1 of 1 slices shown (9 of 12)]
[im 1/1]
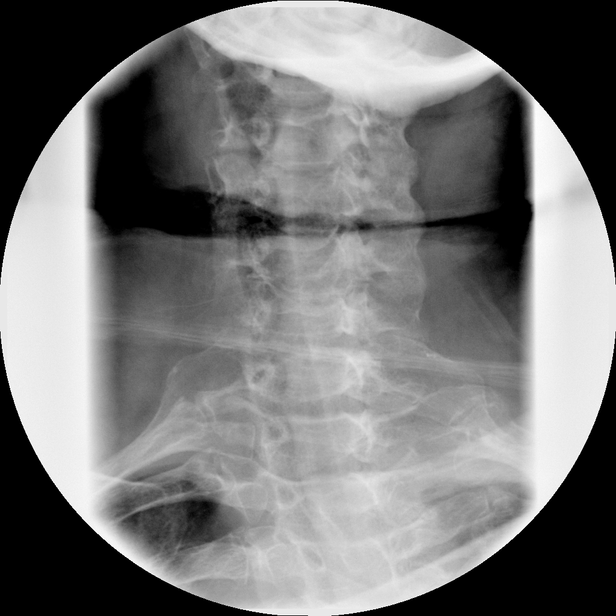

[Series 23: myelogram  white · 1 of 1 slices shown (10 of 12)]
[im 1/1]
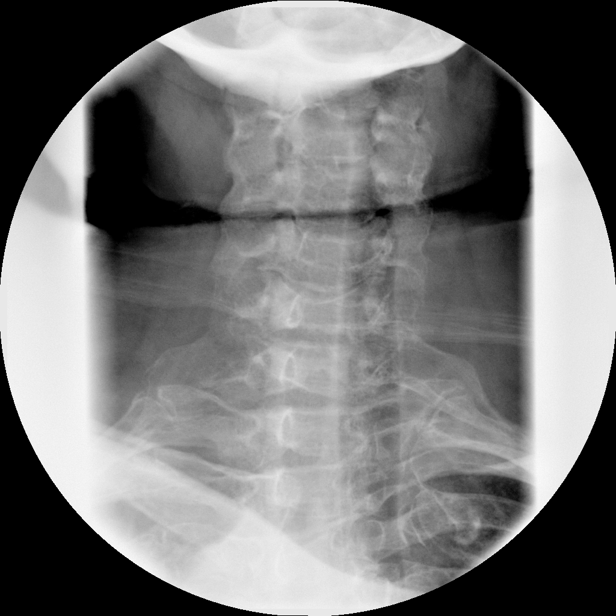

[Series 25: myelogram  white · 1 of 1 slices shown (11 of 12)]
[im 1/1]
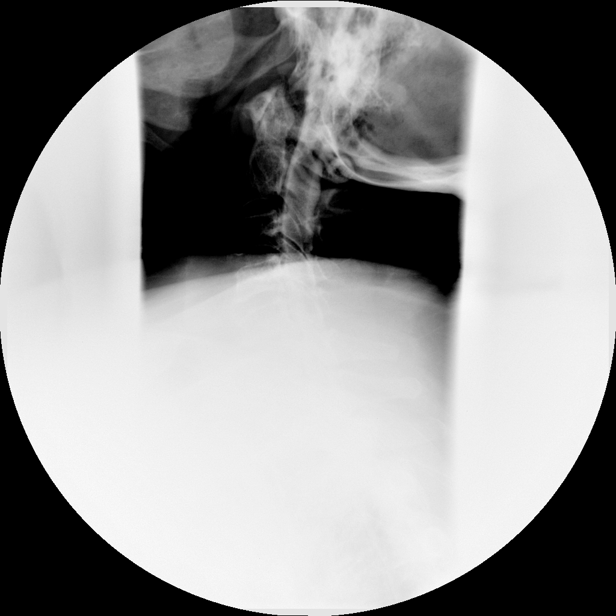

[Series 27: myelogram  white · 1 of 1 slices shown (12 of 12)]
[im 1/1]
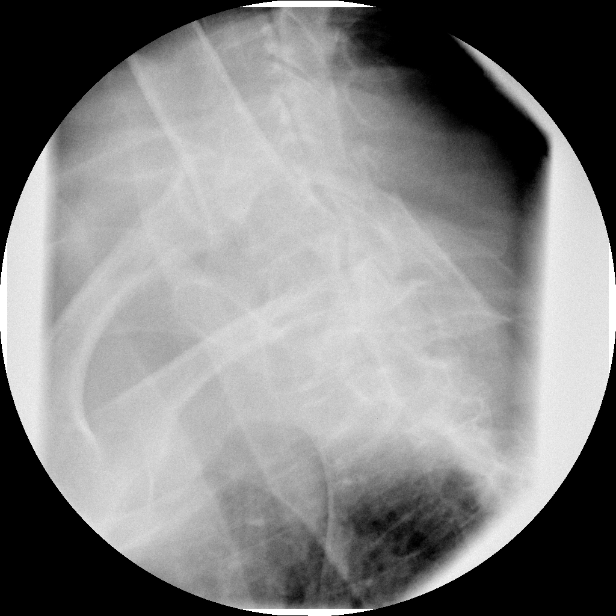

[12 of 22 positions shown; findings below may reference images not displayed]

IMPRESSION: Successful injection of  intrathecal contrast for myelography.

MYELOGRAM LUMBAR
FINDINGS: Postsurgical changes at L5 status post laminectomy.  No
nerve root cut off or spinal stenosis.  Congenital fusion at L2-3.
Thickened filum terminale.

Fluoroscopy Time: 3.50 minutes
IMPRESSION: As  above

CT MYELOGRAPHY LUMBAR SPINE
FINDINGS: No prevertebral or paraspinous masses.  Supine and prone
scanning was employed to better opacify the subarachnoid space.

L1-2: Mild bulge.  No stenosis or disc protrusion. Conus low.

L2-3: Congenital fusion.  Fibrous diastomatomyelia with hemicords
of equal size on the right and left. No disc pathology.

L3-4: No disc pathology.  Mild spina bifida.  Mild facet
arthropathy. Bilateral foraminal narrowing, left greater than
right. No spinal stenosis or L4 nerve root encroachment, but left
L3 nerve root encroachment may be present. Tethered cord dorsally.

L4-5: Small hemilaminotomy defect on the left.  Left greater than
right facet arthropathy.  Disc space narrowing. Tethered cord
dorsally with thickened filum terminale below this.  Asymmetric
lateral recess encroachment and foraminal narrowing on the left.
No significant disc bulge or protrusion. No definite L5 nerve root
encroachment.  Left L4  nerve root encroachment may be present.

L5-S1: Broad-based central protrusion.  Right greater than left
facet arthropathy.  No definite S1 nerve root encroachment.
Thickened filum terminale inserts dorsally.

There is overall good general agreement compared with prior MR.
Foraminal narrowing appears to be a combination of bone and soft
tissue; there is no visible calcific or bony spike associated with
the split cord.
IMPRESSION: Diastomatomyelia centered at L2-3.

Asymmetric foraminal narrowing, possibly compressing exiting nerve
roots, at L3-4, and L4-5, left.

Central protrusion at L5-S1, non compressive.

Tethered cord below the split cord with thickened filum terminale.

Congenital fusion L2 and L3

MYELOGRAM CERVICAL
FINDINGS: Contrast was maneuvered into the cervical right region
following lumbar myelography.  There is a prominent ventral defect
at C4-5.  Significant disc space narrowing is present C5-6 and C6-
7.  Suggestion of right C6 nerve root encroachment is noted.
IMPRESSION: As above

CT MYELOGRAPHY CERVICAL SPINE
The individual disc spaces were examined as follows:

C2-3:  Normal.

C3-4:  Mild bulge.  No stenosis or disc protrusion.

C4-5:  Central and leftward disc protrusion.  Mild effacement left
C5 nerve root.

C5-6:  Advanced disc space narrowing.  Calcified central protrusion
with bilateral uncinate spurring, right greater than left.
Bilateral C6 nerve root encroachment is present worse on the right.

C6-7:  Shallow central protrusion, non compressive.  No definite C7
nerve root encroachment.

C7-T1:  No disc pathology.

Far right and left parasagittal images demonstrate lower cervical
facet arthropathy, worst at C5-6 and C6-7. There is no evidence for
tonsillar herniation or syringomyelia.
IMPRESSION: Multilevel spondylosis, worst from C4-C7; see comments above

## 2011-02-07 ENCOUNTER — Encounter: Payer: BC Managed Care – PPO | Admitting: *Deleted

## 2011-02-14 ENCOUNTER — Ambulatory Visit (INDEPENDENT_AMBULATORY_CARE_PROVIDER_SITE_OTHER): Payer: BC Managed Care – PPO | Admitting: *Deleted

## 2011-02-14 DIAGNOSIS — I4891 Unspecified atrial fibrillation: Secondary | ICD-10-CM

## 2011-02-14 LAB — POCT INR: INR: 3.1

## 2011-02-23 ENCOUNTER — Encounter: Payer: Self-pay | Admitting: Cardiology

## 2011-02-23 DIAGNOSIS — G4733 Obstructive sleep apnea (adult) (pediatric): Secondary | ICD-10-CM | POA: Insufficient documentation

## 2011-03-14 ENCOUNTER — Encounter: Payer: BC Managed Care – PPO | Admitting: *Deleted

## 2011-03-17 ENCOUNTER — Ambulatory Visit (INDEPENDENT_AMBULATORY_CARE_PROVIDER_SITE_OTHER): Payer: BC Managed Care – PPO | Admitting: *Deleted

## 2011-03-17 DIAGNOSIS — I4891 Unspecified atrial fibrillation: Secondary | ICD-10-CM

## 2011-03-24 LAB — BASIC METABOLIC PANEL
BUN: 7
CO2: 23
Calcium: 8.9
Creatinine, Ser: 0.58
GFR calc Af Amer: >60
Glucose, Bld: 254 — ABNORMAL HIGH

## 2011-03-24 LAB — DIFFERENTIAL
Eosinophils Absolute: 0.1
Eosinophils Relative: 1
Lymphocytes Relative: 31
Lymphs Abs: 2.6
Monocytes Absolute: 0.7
Monocytes Relative: 8

## 2011-03-24 LAB — CBC
MCHC: 32.9
Platelets: 255
RBC: 4.2
RDW: 14.3

## 2011-03-31 ENCOUNTER — Ambulatory Visit (INDEPENDENT_AMBULATORY_CARE_PROVIDER_SITE_OTHER): Payer: Medicare Other | Admitting: *Deleted

## 2011-03-31 DIAGNOSIS — I4891 Unspecified atrial fibrillation: Secondary | ICD-10-CM

## 2011-04-01 LAB — WOUND CULTURE

## 2011-04-04 LAB — DIFFERENTIAL
Basophils Absolute: 0
Basophils Absolute: 0.1
Basophils Absolute: 0.1
Basophils Relative: 1
Eosinophils Absolute: 0.2
Eosinophils Absolute: 0.1
Eosinophils Relative: 2
Eosinophils Relative: 2
Lymphocytes Relative: 25
Lymphocytes Relative: 38
Monocytes Relative: 7
Neutro Abs: 5.7
Neutro Abs: 6.2
Neutrophils Relative %: 67
Neutrophils Relative %: 59

## 2011-04-04 LAB — BASIC METABOLIC PANEL
BUN: 12
BUN: 8
BUN: 8
CO2: 31
CO2: 31
Calcium: 8.5
Calcium: 9
Chloride: 102
Chloride: 98
Chloride: 99
Creatinine, Ser: 0.79
Creatinine, Ser: 0.8
GFR calc Af Amer: >60
GFR calc Af Amer: >60
GFR calc Af Amer: >60
GFR calc Af Amer: >60
GFR calc non Af Amer: >60
GFR calc non Af Amer: >60
GFR calc non Af Amer: >60
GFR calc non Af Amer: >60
GFR calc non Af Amer: >60
Glucose, Bld: 205 — ABNORMAL HIGH
Glucose, Bld: 248 — ABNORMAL HIGH
Glucose, Bld: 289 — ABNORMAL HIGH
Glucose, Bld: 332 — ABNORMAL HIGH
Potassium: 3 — ABNORMAL LOW
Potassium: 3.4 — ABNORMAL LOW
Potassium: 3.6
Potassium: 3.6
Sodium: 137
Sodium: 139
Sodium: 140
Sodium: 140
Sodium: 141

## 2011-04-04 LAB — CBC
HCT: 30.4 — ABNORMAL LOW
HCT: 33.7 — ABNORMAL LOW
HCT: 34.1 — ABNORMAL LOW
HCT: 34.1 — ABNORMAL LOW
HCT: 37.5
Hemoglobin: 10.3 — ABNORMAL LOW
Hemoglobin: 11.2 — ABNORMAL LOW
Hemoglobin: 9.8 — ABNORMAL LOW
Hemoglobin: 9.9 — ABNORMAL LOW
MCHC: 32.4
MCHC: 32.8
MCHC: 32.7
MCV: 81.6
MCV: 81.7
MCV: 83
MCV: 83.6
MCV: 84.1
MCV: 83
Platelets: 280
Platelets: 293
Platelets: 302
Platelets: 310
Platelets: 349
RBC: 3.6 — ABNORMAL LOW
RBC: 3.61 — ABNORMAL LOW
RBC: 4.11
RBC: 4.6
RDW: 16.5 — ABNORMAL HIGH
RDW: 16.7 — ABNORMAL HIGH
RDW: 16.8 — ABNORMAL HIGH
RDW: 16.8 — ABNORMAL HIGH
RDW: 17.2 — ABNORMAL HIGH
RDW: 18.4 — ABNORMAL HIGH
WBC: 11 — ABNORMAL HIGH
WBC: 8.5
WBC: 9
WBC: 9.1
WBC: 9.6

## 2011-04-04 LAB — GLUCOSE, CAPILLARY
Glucose-Capillary: 233 — ABNORMAL HIGH
Glucose-Capillary: 234 — ABNORMAL HIGH
Glucose-Capillary: 237 — ABNORMAL HIGH
Glucose-Capillary: 239 — ABNORMAL HIGH
Glucose-Capillary: 242 — ABNORMAL HIGH
Glucose-Capillary: 249 — ABNORMAL HIGH
Glucose-Capillary: 269 — ABNORMAL HIGH
Glucose-Capillary: 270 — ABNORMAL HIGH
Glucose-Capillary: 276 — ABNORMAL HIGH
Glucose-Capillary: 291 — ABNORMAL HIGH
Glucose-Capillary: 292 — ABNORMAL HIGH
Glucose-Capillary: 312 — ABNORMAL HIGH
Glucose-Capillary: 386 — ABNORMAL HIGH

## 2011-04-04 LAB — TROPONIN I: Troponin I: 0.06

## 2011-04-04 LAB — URINE MICROSCOPIC-ADD ON

## 2011-04-04 LAB — APTT: aPTT: 85 — ABNORMAL HIGH

## 2011-04-04 LAB — URINALYSIS, ROUTINE W REFLEX MICROSCOPIC
Bilirubin Urine: NEGATIVE
Hgb urine dipstick: NEGATIVE
Nitrite: NEGATIVE
Specific Gravity, Urine: 1.012
Specific Gravity, Urine: >1.03 — ABNORMAL HIGH
Urobilinogen, UA: 0.2
pH: 6

## 2011-04-04 LAB — COMPREHENSIVE METABOLIC PANEL
ALT: 13
ALT: 20
AST: 15
Alkaline Phosphatase: 64
BUN: 23
CO2: 22
CO2: 24
Chloride: 103
Chloride: 99
Creatinine, Ser: 1.21 — ABNORMAL HIGH
GFR calc Af Amer: 55 — ABNORMAL LOW
GFR calc non Af Amer: 46 — ABNORMAL LOW
Glucose, Bld: 195 — ABNORMAL HIGH
Potassium: 3.7
Sodium: 136
Total Bilirubin: 0.6
Total Bilirubin: 0.6
Total Protein: 7.3

## 2011-04-04 LAB — URINE CULTURE: Colony Count: 2000

## 2011-04-04 LAB — HEPARIN LEVEL (UNFRACTIONATED)
Heparin Unfractionated: 0.25 — ABNORMAL LOW
Heparin Unfractionated: 0.3
Heparin Unfractionated: 0.44
Heparin Unfractionated: 0.46

## 2011-04-04 LAB — CARDIAC PANEL(CRET KIN+CKTOT+MB+TROPI)
CK, MB: 1.9
CK, MB: 2.2
Total CK: 75
Total CK: 75

## 2011-04-04 LAB — PROTIME-INR
INR: 1
INR: 1.1
INR: 2.3 — ABNORMAL HIGH
INR: 2.3 — ABNORMAL HIGH
Prothrombin Time: 14.2
Prothrombin Time: 21.5 — ABNORMAL HIGH
Prothrombin Time: 27.2 — ABNORMAL HIGH

## 2011-04-04 LAB — LIPID PANEL
LDL Cholesterol: 94
Triglycerides: 170 — ABNORMAL HIGH

## 2011-04-04 LAB — MAGNESIUM: Magnesium: 1.7

## 2011-04-04 LAB — POCT I-STAT, CHEM 8
BUN: 11
Potassium: 3.9
Sodium: 141
TCO2: 23

## 2011-04-04 LAB — TSH: TSH: 1.652

## 2011-04-04 LAB — POCT CARDIAC MARKERS
CKMB, poc: 1.6
Myoglobin, poc: 52.9
Troponin i, poc: <0.05

## 2011-04-04 LAB — B-NATRIURETIC PEPTIDE (CONVERTED LAB)
Pro B Natriuretic peptide (BNP): 342 — ABNORMAL HIGH
Pro B Natriuretic peptide (BNP): 42.5

## 2011-04-04 LAB — CK TOTAL AND CKMB (NOT AT ARMC): Total CK: 65

## 2011-04-07 LAB — URINE MICROSCOPIC-ADD ON

## 2011-04-07 LAB — BASIC METABOLIC PANEL
Calcium: 8.4 mg/dL (ref 8.4–10.5)
GFR calc Af Amer: >60 mL/min (ref >60)
GFR calc non Af Amer: >60 mL/min (ref >60)
Glucose, Bld: 281 mg/dL — ABNORMAL HIGH (ref 70–99)
Potassium: 3.4 mEq/L — ABNORMAL LOW (ref 3.5–5.1)
Sodium: 132 mEq/L — ABNORMAL LOW (ref 135–145)

## 2011-04-07 LAB — LIPID PANEL
Cholesterol: 203 mg/dL — ABNORMAL HIGH (ref 0–200)
HDL: 40 mg/dL (ref >39)
LDL Cholesterol: 129 mg/dL — ABNORMAL HIGH (ref 0–99)
Total CHOL/HDL Ratio: 5.1 RATIO

## 2011-04-07 LAB — URINALYSIS, ROUTINE W REFLEX MICROSCOPIC
Glucose, UA: >1000 mg/dL — AB
Glucose, UA: >1000 mg/dL — AB
Hgb urine dipstick: NEGATIVE
Ketones, ur: NEGATIVE mg/dL
Leukocytes, UA: NEGATIVE
Leukocytes, UA: NEGATIVE
Protein, ur: NEGATIVE mg/dL
Protein, ur: NEGATIVE mg/dL
Specific Gravity, Urine: 1.017 (ref 1.005–1.030)
Urobilinogen, UA: 1 mg/dL (ref 0.0–1.0)

## 2011-04-07 LAB — GLUCOSE, CAPILLARY
Glucose-Capillary: 246 mg/dL — ABNORMAL HIGH (ref 70–99)
Glucose-Capillary: 296 mg/dL — ABNORMAL HIGH (ref 70–99)
Glucose-Capillary: 305 mg/dL — ABNORMAL HIGH (ref 70–99)
Glucose-Capillary: 318 mg/dL — ABNORMAL HIGH (ref 70–99)
Glucose-Capillary: 320 mg/dL — ABNORMAL HIGH (ref 70–99)

## 2011-04-07 LAB — POCT I-STAT, CHEM 8
BUN: 9 mg/dL (ref 6–23)
Calcium, Ion: 0.97 mmol/L — ABNORMAL LOW (ref 1.12–1.32)
HCT: 31 % — ABNORMAL LOW (ref 36.0–46.0)
Hemoglobin: 10.5 g/dL — ABNORMAL LOW (ref 12.0–15.0)
TCO2: 21 mmol/L (ref 0–100)

## 2011-04-07 LAB — CARDIAC PANEL(CRET KIN+CKTOT+MB+TROPI)
CK, MB: 1.3 ng/mL (ref 0.3–4.0)
Relative Index: INVALID (ref 0.0–2.5)
Total CK: 44 U/L (ref 7–177)
Troponin I: 0.06 ng/mL (ref 0.00–0.06)

## 2011-04-07 LAB — PROTIME-INR
INR: 2.4 — ABNORMAL HIGH (ref 0.00–1.49)
Prothrombin Time: 25 seconds — ABNORMAL HIGH (ref 11.6–15.2)
Prothrombin Time: 28 seconds — ABNORMAL HIGH (ref 11.6–15.2)

## 2011-04-07 LAB — DIFFERENTIAL
Eosinophils Absolute: 0.2 10*3/uL (ref 0.0–0.7)
Eosinophils Relative: 2 % (ref 0–5)
Eosinophils Relative: 2 % (ref 0–5)
Lymphocytes Relative: 17 % (ref 12–46)
Lymphocytes Relative: 20 % (ref 12–46)
Lymphs Abs: 1.5 10*3/uL (ref 0.7–4.0)
Lymphs Abs: 1.6 10*3/uL (ref 0.7–4.0)
Monocytes Absolute: 1 10*3/uL (ref 0.1–1.0)
Monocytes Absolute: 1.3 10*3/uL — ABNORMAL HIGH (ref 0.1–1.0)
Monocytes Relative: 14 % — ABNORMAL HIGH (ref 3–12)
Neutro Abs: 6.1 10*3/uL (ref 1.7–7.7)

## 2011-04-07 LAB — COMPREHENSIVE METABOLIC PANEL
ALT: 158 U/L — ABNORMAL HIGH (ref 0–35)
AST: 67 U/L — ABNORMAL HIGH (ref 0–37)
BUN: 9 mg/dL (ref 6–23)
CO2: 23 mEq/L (ref 19–32)
Calcium: 8.7 mg/dL (ref 8.4–10.5)
Chloride: 103 mEq/L (ref 96–112)
Creatinine, Ser: 0.65 mg/dL (ref 0.4–1.2)
GFR calc Af Amer: >60 mL/min (ref >60)
GFR calc non Af Amer: >60 mL/min (ref >60)
Sodium: 134 mEq/L — ABNORMAL LOW (ref 135–145)
Total Bilirubin: 1.2 mg/dL (ref 0.3–1.2)
Total Protein: 6.1 g/dL (ref 6.0–8.3)

## 2011-04-07 LAB — CBC
HCT: 30.2 % — ABNORMAL LOW (ref 36.0–46.0)
HCT: 31.7 % — ABNORMAL LOW (ref 36.0–46.0)
Hemoglobin: 10 g/dL — ABNORMAL LOW (ref 12.0–15.0)
Hemoglobin: 10.1 g/dL — ABNORMAL LOW (ref 12.0–15.0)
Hemoglobin: 10.5 g/dL — ABNORMAL LOW (ref 12.0–15.0)
MCHC: 33.6 g/dL (ref 30.0–36.0)
MCV: 81.9 fL (ref 78.0–100.0)
MCV: 83.3 fL (ref 78.0–100.0)
RBC: 3.62 MIL/uL — ABNORMAL LOW (ref 3.87–5.11)
RDW: 17.1 % — ABNORMAL HIGH (ref 11.5–15.5)
RDW: 17.4 % — ABNORMAL HIGH (ref 11.5–15.5)
WBC: 7.7 10*3/uL (ref 4.0–10.5)
WBC: 9.3 10*3/uL (ref 4.0–10.5)

## 2011-04-07 LAB — CK TOTAL AND CKMB (NOT AT ARMC)
Relative Index: INVALID (ref 0.0–2.5)
Total CK: 65 U/L (ref 7–177)

## 2011-04-07 LAB — HEMOGLOBIN A1C: Mean Plasma Glucose: 200 mg/dL

## 2011-04-07 LAB — URINE CULTURE: Colony Count: >=100000

## 2011-04-07 LAB — POCT CARDIAC MARKERS

## 2011-04-07 LAB — B-NATRIURETIC PEPTIDE (CONVERTED LAB): Pro B Natriuretic peptide (BNP): 190 pg/mL — ABNORMAL HIGH (ref 0.0–100.0)

## 2011-04-07 LAB — APTT: aPTT: 42 seconds — ABNORMAL HIGH (ref 24–37)

## 2011-04-07 LAB — TSH: TSH: 0.276 u[IU]/mL — ABNORMAL LOW (ref 0.350–4.500)

## 2011-04-07 LAB — HEPATITIS PANEL, ACUTE: Hepatitis B Surface Ag: NEGATIVE

## 2011-04-28 ENCOUNTER — Ambulatory Visit (INDEPENDENT_AMBULATORY_CARE_PROVIDER_SITE_OTHER): Payer: Medicare Other | Admitting: *Deleted

## 2011-04-28 DIAGNOSIS — I4891 Unspecified atrial fibrillation: Secondary | ICD-10-CM

## 2011-04-28 DIAGNOSIS — Z7901 Long term (current) use of anticoagulants: Secondary | ICD-10-CM

## 2011-04-28 LAB — POCT INR: INR: 2.2

## 2011-05-25 ENCOUNTER — Ambulatory Visit (INDEPENDENT_AMBULATORY_CARE_PROVIDER_SITE_OTHER): Payer: Medicare Other | Admitting: *Deleted

## 2011-05-25 DIAGNOSIS — Z7901 Long term (current) use of anticoagulants: Secondary | ICD-10-CM

## 2011-05-25 DIAGNOSIS — I4891 Unspecified atrial fibrillation: Secondary | ICD-10-CM

## 2011-06-22 ENCOUNTER — Encounter: Payer: BC Managed Care – PPO | Admitting: *Deleted

## 2011-06-23 ENCOUNTER — Encounter: Payer: Self-pay | Admitting: Cardiology

## 2011-06-23 DIAGNOSIS — Z7901 Long term (current) use of anticoagulants: Secondary | ICD-10-CM | POA: Insufficient documentation

## 2011-06-29 IMAGING — CR DG CHEST 2V
2 series · 2 of 2 positions shown · non-contrast
Comparison: Chest x-ray of 08/13/2008

CLINICAL DATA: Rapid heart rate, dizziness, chest pain

CHEST - 2 VIEW

[w chest pa]
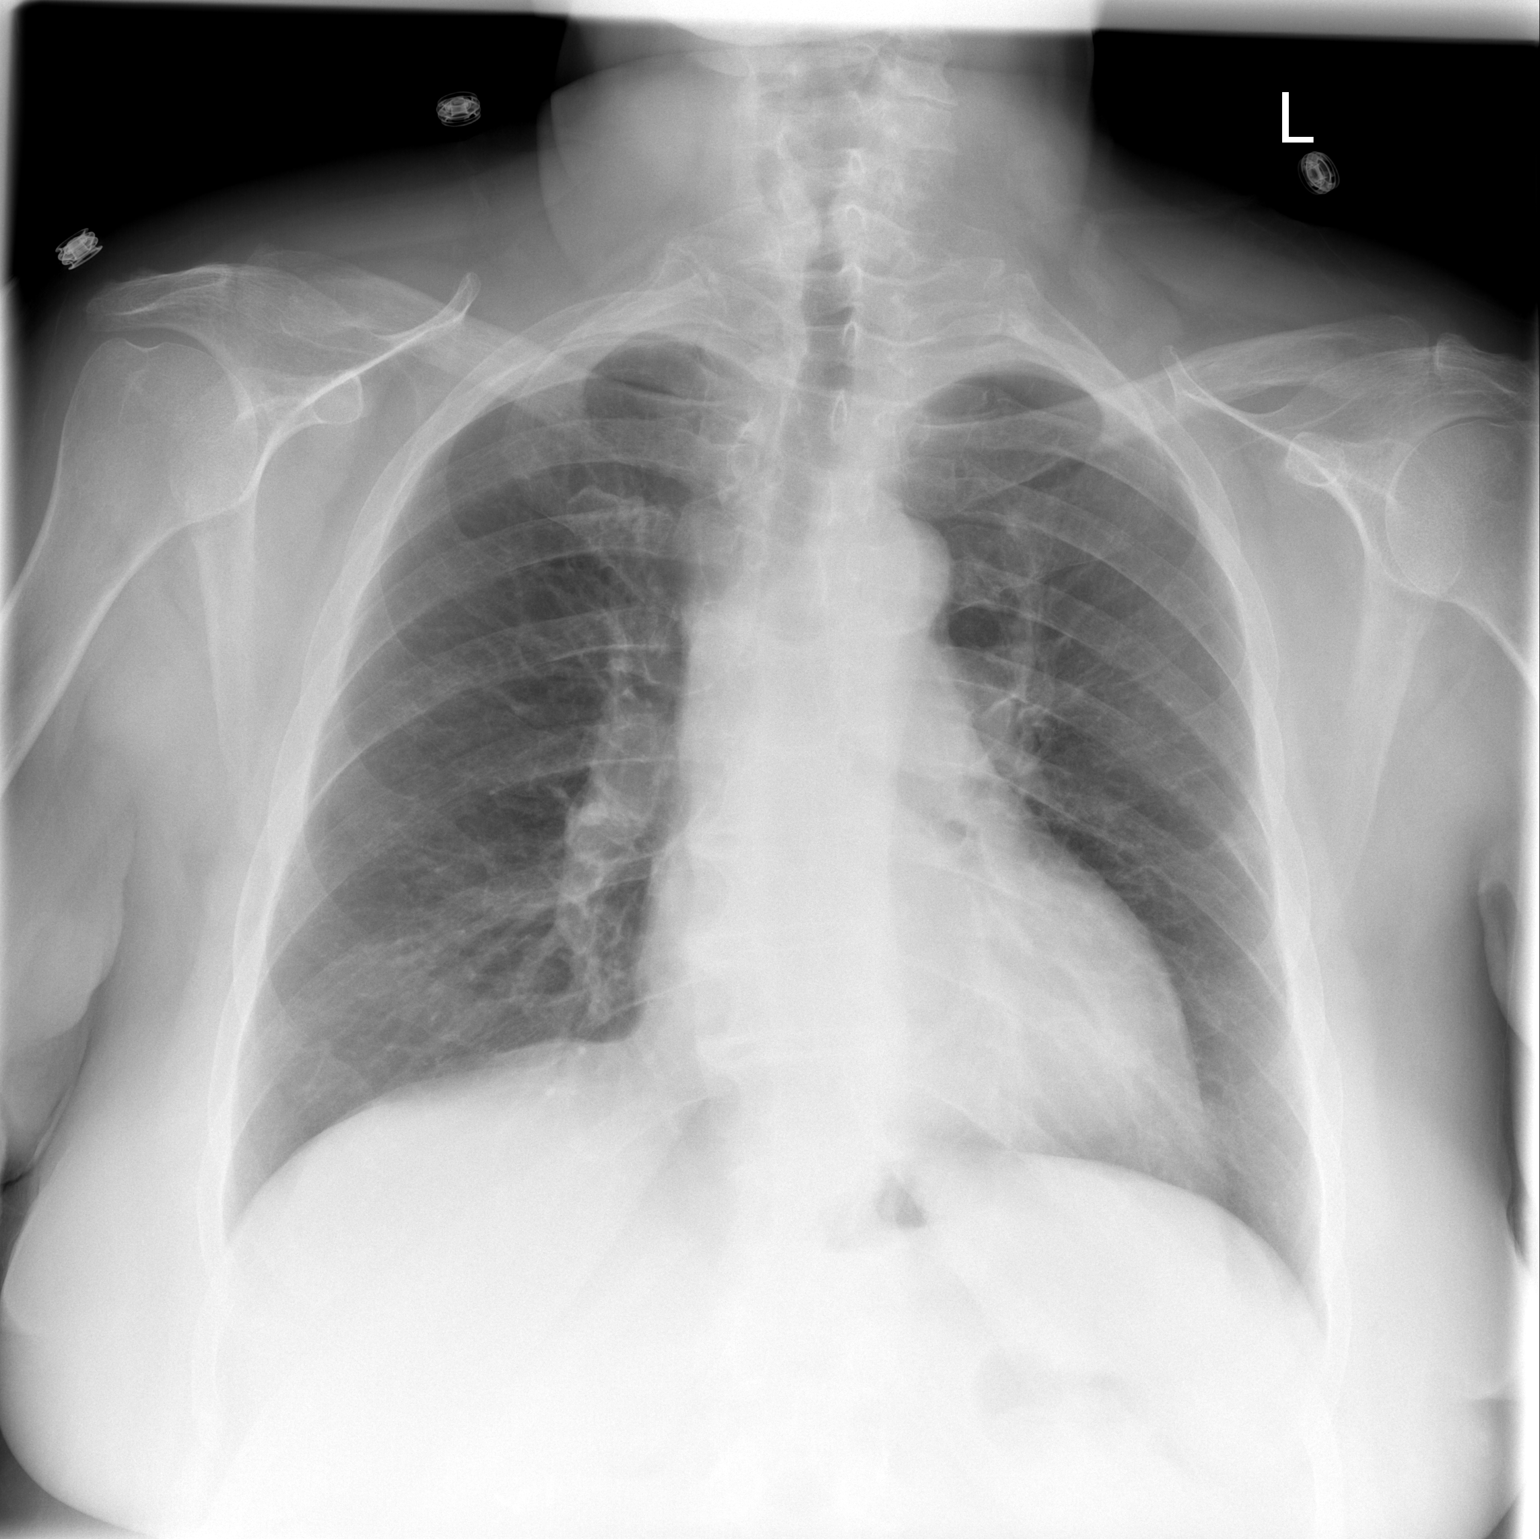

[w chest lat]
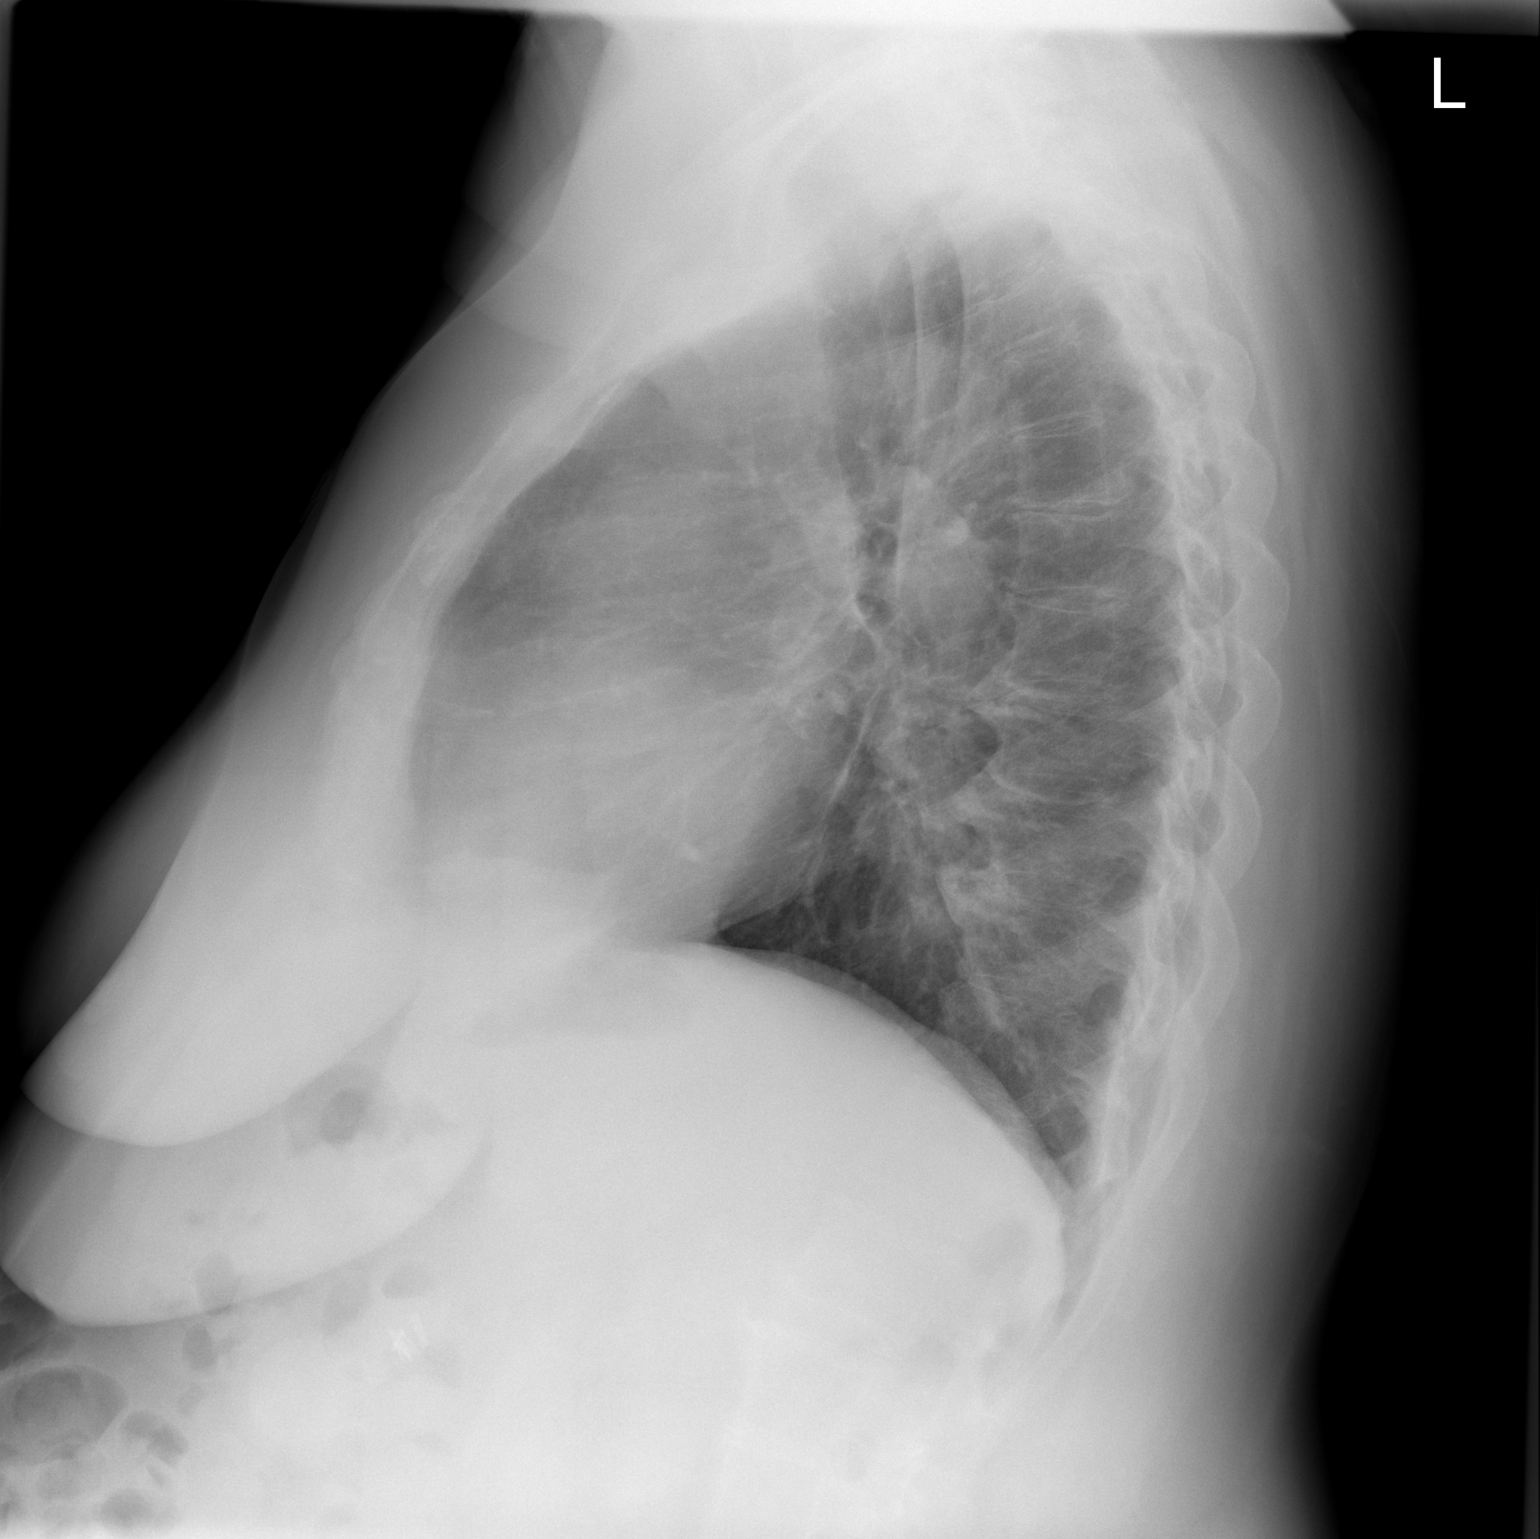

[2 of 2 positions shown; findings below may reference images not displayed]

FINDINGS: The lungs are clear.  The heart is within normal limits
in size.  There are degenerative changes throughout the thoracic
spine.
IMPRESSION: No active lung disease.

## 2011-07-18 ENCOUNTER — Ambulatory Visit (INDEPENDENT_AMBULATORY_CARE_PROVIDER_SITE_OTHER): Payer: Medicare Other | Admitting: *Deleted

## 2011-07-18 DIAGNOSIS — I4891 Unspecified atrial fibrillation: Secondary | ICD-10-CM

## 2011-07-18 DIAGNOSIS — Z7901 Long term (current) use of anticoagulants: Secondary | ICD-10-CM | POA: Diagnosis not present

## 2011-07-18 LAB — POCT INR: INR: 1

## 2011-07-28 ENCOUNTER — Ambulatory Visit (INDEPENDENT_AMBULATORY_CARE_PROVIDER_SITE_OTHER): Payer: Medicare Other | Admitting: *Deleted

## 2011-07-28 DIAGNOSIS — Z7901 Long term (current) use of anticoagulants: Secondary | ICD-10-CM

## 2011-07-28 DIAGNOSIS — I4891 Unspecified atrial fibrillation: Secondary | ICD-10-CM | POA: Diagnosis not present

## 2011-07-28 LAB — POCT INR: INR: 1.7

## 2011-08-11 ENCOUNTER — Encounter: Payer: Medicare Other | Admitting: *Deleted

## 2011-08-11 ENCOUNTER — Ambulatory Visit (INDEPENDENT_AMBULATORY_CARE_PROVIDER_SITE_OTHER): Payer: PRIVATE HEALTH INSURANCE | Admitting: *Deleted

## 2011-08-11 DIAGNOSIS — I4891 Unspecified atrial fibrillation: Secondary | ICD-10-CM

## 2011-08-11 DIAGNOSIS — Z7901 Long term (current) use of anticoagulants: Secondary | ICD-10-CM

## 2011-08-11 LAB — POCT INR: INR: 3.1

## 2011-09-01 ENCOUNTER — Ambulatory Visit (INDEPENDENT_AMBULATORY_CARE_PROVIDER_SITE_OTHER): Payer: Medicare Other | Admitting: *Deleted

## 2011-09-01 ENCOUNTER — Encounter (HOSPITAL_COMMUNITY): Payer: Self-pay | Admitting: *Deleted

## 2011-09-01 ENCOUNTER — Observation Stay (HOSPITAL_COMMUNITY)
Admission: AD | Admit: 2011-09-01 | Discharge: 2011-09-04 | Disposition: A | Discharge status: Home / Self Care | Payer: Medicare Other | Source: Ambulatory Visit | Attending: Family Medicine | Admitting: Family Medicine

## 2011-09-01 ENCOUNTER — Observation Stay (HOSPITAL_COMMUNITY): Payer: Medicare Other

## 2011-09-01 ENCOUNTER — Telehealth: Payer: Self-pay

## 2011-09-01 DIAGNOSIS — E119 Type 2 diabetes mellitus without complications: Secondary | ICD-10-CM

## 2011-09-01 DIAGNOSIS — R5381 Other malaise: Secondary | ICD-10-CM | POA: Insufficient documentation

## 2011-09-01 DIAGNOSIS — D509 Iron deficiency anemia, unspecified: Secondary | ICD-10-CM | POA: Diagnosis present

## 2011-09-01 DIAGNOSIS — I509 Heart failure, unspecified: Secondary | ICD-10-CM

## 2011-09-01 DIAGNOSIS — I10 Essential (primary) hypertension: Secondary | ICD-10-CM

## 2011-09-01 DIAGNOSIS — G4733 Obstructive sleep apnea (adult) (pediatric): Secondary | ICD-10-CM | POA: Insufficient documentation

## 2011-09-01 DIAGNOSIS — N390 Urinary tract infection, site not specified: Secondary | ICD-10-CM | POA: Insufficient documentation

## 2011-09-01 DIAGNOSIS — E7800 Pure hypercholesterolemia, unspecified: Secondary | ICD-10-CM | POA: Insufficient documentation

## 2011-09-01 DIAGNOSIS — E669 Obesity, unspecified: Secondary | ICD-10-CM | POA: Insufficient documentation

## 2011-09-01 DIAGNOSIS — R072 Precordial pain: Secondary | ICD-10-CM

## 2011-09-01 DIAGNOSIS — Z7901 Long term (current) use of anticoagulants: Secondary | ICD-10-CM | POA: Insufficient documentation

## 2011-09-01 DIAGNOSIS — R634 Abnormal weight loss: Secondary | ICD-10-CM | POA: Insufficient documentation

## 2011-09-01 DIAGNOSIS — I4891 Unspecified atrial fibrillation: Principal | ICD-10-CM | POA: Diagnosis present

## 2011-09-01 DIAGNOSIS — I517 Cardiomegaly: Secondary | ICD-10-CM | POA: Insufficient documentation

## 2011-09-01 HISTORY — DX: Barrett's esophagus without dysplasia: K22.70

## 2011-09-01 LAB — CBC
HCT: 30 % — ABNORMAL LOW (ref 36.0–46.0)
Hemoglobin: 9.1 g/dL — ABNORMAL LOW (ref 12.0–15.0)
RBC: 3.87 MIL/uL (ref 3.87–5.11)
RDW: 17.9 % — ABNORMAL HIGH (ref 11.5–15.5)
WBC: 6.4 10*3/uL (ref 4.0–10.5)

## 2011-09-01 LAB — GLUCOSE, CAPILLARY
Glucose-Capillary: 160 mg/dL — ABNORMAL HIGH (ref 70–99)
Glucose-Capillary: 134 mg/dL — ABNORMAL HIGH (ref 70–99)

## 2011-09-01 LAB — COMPREHENSIVE METABOLIC PANEL
ALT: 12 U/L (ref 0–35)
AST: 16 U/L (ref 0–37)
Albumin: 3.5 g/dL (ref 3.5–5.2)
Alkaline Phosphatase: 50 U/L (ref 39–117)
Calcium: 9.6 mg/dL (ref 8.4–10.5)
Potassium: 4.4 mEq/L (ref 3.5–5.1)
Sodium: 139 mEq/L (ref 135–145)
Total Protein: 6.8 g/dL (ref 6.0–8.3)

## 2011-09-01 LAB — PROTIME-INR: INR: 2.12 — ABNORMAL HIGH (ref 0.00–1.49)

## 2011-09-01 MED ORDER — WARFARIN - PHARMACIST DOSING INPATIENT
Freq: Every day | Status: DC
  Filled 2011-09-01 (×2): qty 1

## 2011-09-01 MED ORDER — DILTIAZEM HCL 60 MG PO TABS
60.0000 mg | ORAL_TABLET | Freq: Once | ORAL | Status: AC
  Administered 2011-09-01: 60 mg via ORAL
  Filled 2011-09-01: qty 1

## 2011-09-01 MED ORDER — DILTIAZEM HCL 25 MG/5ML IV SOLN
20.0000 mg | Freq: Once | INTRAVENOUS | Status: AC
  Administered 2011-09-01: 20 mg via INTRAVENOUS
  Filled 2011-09-01 (×2): qty 5

## 2011-09-01 MED ORDER — LORAZEPAM 1 MG PO TABS
1.0000 mg | ORAL_TABLET | Freq: Three times a day (TID) | ORAL | Status: DC
  Administered 2011-09-01 – 2011-09-04 (×9): 1 mg via ORAL
  Filled 2011-09-01 (×9): qty 1

## 2011-09-01 MED ORDER — POTASSIUM CHLORIDE CRYS ER 20 MEQ PO TBCR
20.0000 meq | EXTENDED_RELEASE_TABLET | Freq: Every day | ORAL | Status: DC
  Administered 2011-09-01 – 2011-09-04 (×4): 20 meq via ORAL
  Filled 2011-09-01 (×4): qty 1

## 2011-09-01 MED ORDER — WARFARIN SODIUM 2.5 MG PO TABS
2.5000 mg | ORAL_TABLET | Freq: Every day | ORAL | Status: DC
  Administered 2011-09-01 – 2011-09-03 (×3): 2.5 mg via ORAL
  Filled 2011-09-01 (×3): qty 1

## 2011-09-01 MED ORDER — METAXALONE 800 MG PO TABS
800.0000 mg | ORAL_TABLET | Freq: Three times a day (TID) | ORAL | Status: DC
  Administered 2011-09-01 – 2011-09-04 (×9): 800 mg via ORAL
  Filled 2011-09-01 (×9): qty 1

## 2011-09-01 MED ORDER — GLIPIZIDE 5 MG PO TABS
10.0000 mg | ORAL_TABLET | Freq: Two times a day (BID) | ORAL | Status: DC
  Administered 2011-09-02 – 2011-09-04 (×5): 10 mg via ORAL
  Filled 2011-09-01 (×5): qty 2

## 2011-09-01 MED ORDER — DILTIAZEM HCL 60 MG PO TABS
60.0000 mg | ORAL_TABLET | Freq: Three times a day (TID) | ORAL | Status: DC
  Administered 2011-09-02 – 2011-09-03 (×5): 60 mg via ORAL
  Filled 2011-09-01 (×6): qty 1

## 2011-09-01 MED ORDER — SIMVASTATIN 20 MG PO TABS
40.0000 mg | ORAL_TABLET | Freq: Every day | ORAL | Status: DC
  Administered 2011-09-01: 40 mg via ORAL
  Filled 2011-09-01: qty 2

## 2011-09-01 MED ORDER — CITALOPRAM HYDROBROMIDE 20 MG PO TABS
20.0000 mg | ORAL_TABLET | Freq: Every day | ORAL | Status: DC
  Administered 2011-09-01 – 2011-09-04 (×4): 20 mg via ORAL
  Filled 2011-09-01 (×4): qty 1

## 2011-09-01 MED ORDER — LEVOTHYROXINE SODIUM 25 MCG PO TABS
125.0000 ug | ORAL_TABLET | Freq: Every day | ORAL | Status: DC
  Administered 2011-09-01 – 2011-09-04 (×4): 125 ug via ORAL
  Filled 2011-09-01 (×4): qty 1

## 2011-09-01 MED ORDER — INSULIN GLARGINE 100 UNIT/ML ~~LOC~~ SOLN
50.0000 [IU] | Freq: Every day | SUBCUTANEOUS | Status: DC
  Administered 2011-09-01 – 2011-09-03 (×3): 50 [IU] via SUBCUTANEOUS
  Filled 2011-09-01: qty 3

## 2011-09-01 MED ORDER — SODIUM CHLORIDE 0.9 % IJ SOLN
INTRAMUSCULAR | Status: AC
  Administered 2011-09-01: 10 mL
  Filled 2011-09-01: qty 3

## 2011-09-01 MED ORDER — PANTOPRAZOLE SODIUM 40 MG PO TBEC
40.0000 mg | DELAYED_RELEASE_TABLET | Freq: Every day | ORAL | Status: DC
  Administered 2011-09-01 – 2011-09-04 (×4): 40 mg via ORAL
  Filled 2011-09-01 (×4): qty 1

## 2011-09-01 MED ORDER — SODIUM CHLORIDE 0.9 % IV SOLN
INTRAVENOUS | Status: DC
  Administered 2011-09-01: 17:00:00 via INTRAVENOUS

## 2011-09-01 NOTE — Assessment & Plan Note (Signed)
Patient reports improvement in diabetic control with recent 40 pound weight loss.  She was congratulated on this achievement.

## 2011-09-01 NOTE — Progress Notes (Signed)
Patient ID: [name], female   DOB: 04-Aug-1950, 61 y.o.   MRN: [id number] HPI: Patient seen urgently at her request as a result of development of palpitations associated with weakness, malaise, fatigue, and dizziness.  She has experienced no chest discomfort or dyspnea.  No falls or syncope have occurred.  Onset of symptoms was sudden at approximately 4:30 yesterday afternoon.  Nothing she has done has exacerbated or improved her symptoms.  Prior to Admission medications   Medication Sig Start Date End Date Taking? Authorizing Provider  citalopram (CELEXA) 20 MG tablet Take 20 mg by mouth daily.      Historical Provider, MD  furosemide (LASIX) 20 MG tablet Take 20 mg by mouth daily as needed.      Historical Provider, MD  glipiZIDE (GLUCOTROL) 10 MG tablet Take 10 mg by mouth daily.      Historical Provider, MD  insulin glargine (LANTUS) 100 UNIT/ML injection Inject 30 Units into the skin at bedtime.      Historical Provider, MD  insulin regular (HUMULIN R,NOVOLIN R) 100 units/mL injection Inject into the skin as directed.      Historical Provider, MD  levothyroxine (SYNTHROID, LEVOTHROID) 125 MCG tablet Take 125 mcg by mouth daily.      Historical Provider, MD  LORazepam (ATIVAN) 1 MG tablet Take 1 mg by mouth 3 (three) times daily.      Historical Provider, MD  metaxalone (SKELAXIN) 800 MG tablet Take 800 mg by mouth 3 (three) times daily.      Historical Provider, MD  metFORMIN (GLUCOPHAGE) 1000 MG tablet Take 1,000 mg by mouth 2 (two) times daily.      Historical Provider, MD  metoCLOPramide (REGLAN) 10 MG tablet Take 10 mg by mouth at bedtime.      Historical Provider, MD  nitroGLYCERIN (NITROSTAT) 0.4 MG SL tablet Place 0.4 mg under the tongue as needed.      Historical Provider, MD  omeprazole (PRILOSEC) 20 MG capsule Take 20 mg by mouth 2 (two) times daily.      Historical Provider, MD  ondansetron (ZOFRAN) 4 MG tablet Take 4 mg by mouth daily.      Historical Provider, MD  potassium  chloride SA (K-DUR,KLOR-CON) 20 MEQ tablet Take 20 mEq by mouth as needed.      Historical Provider, MD  simvastatin (ZOCOR) 40 MG tablet Take 40 mg by mouth at bedtime.      Historical Provider, MD  warfarin (COUMADIN) 5 MG tablet Take by mouth as directed.      Historical Provider, MD    Allergies  Allergen Reactions  . Codeine     REACTION: HALLUCINATIONS   Past medical history, social history, and family history reviewed and updated.  ROS: Denies orthopnea, PND, pedal edema, cough or sputum production.  PHYSICAL EXAM: There were no vitals taken for this visit.  General-Well developed; no acute distress Body habitus-proportionate weight and height Neck-No JVD; no carotid bruits Lungs-clear lung fields; resonant to percussion Cardiovascular-normal PMI; normal S1 and S2; modest systolic murmur; rapid irregular rhythm. Abdomen-normal bowel sounds; soft and non-tender without masses or organomegaly Musculoskeletal-No deformities, no cyanosis or clubbing Neurologic-Normal cranial nerves; symmetric strength and tone Skin-Warm, no significant lesions Extremities-distal pulses intact; no edema  EKG: Atrial fibrillation with a rapid ventricular response; ventricular rate is 138 bpm; incomplete right bundle branch block; abnormal R wave progression, perhaps related to lead placement; leftward axis; nonspecific ST-T wave abnormality.  ASSESSMENT AND PLAN:  [place] Bing, MD 09/01/2011  4:19 PM

## 2011-09-01 NOTE — Assessment & Plan Note (Signed)
Blood pressure control has been on each assessment during the past 3 years.  Current medications will be continued.

## 2011-09-01 NOTE — Telephone Encounter (Addendum)
**Note De-Identified  Obfuscation** Pt. has not been seen for OV since 08-2009. Pt. states that when she woke this morning she noticed a flutter in her heart. She states she changed positions in bed and the fluttering stopped but is now fluttering again. Pt. is advised to come by office anytime today so we can obtain an EKG./LV

## 2011-09-01 NOTE — Assessment & Plan Note (Addendum)
Patient has had an extremely durable result from DC cardioversion, with 3.5 years having passed since that procedure was performed without a recurrence of atrial arrhythmias.  Unfortunately, that symptom free interval has ended.  She is tolerating AFa with a rapid ventricular response well, but is generally compromised by multiple medical problems and has a history of CHF with this arrhythmia in the past.  Accordingly, initial evaluation and treatment as an inpatient is warranted.  Her case has been discussed with Dr. Renard Matter who agrees to serve as the attending during an admission to [facility] in Dr. Michelle Nasuti absence.  Our initial strategy will be control of heart rate with the addition of diltiazem.  Anticoagulation with warfarin is therapeutic and will be continued.  She may require repeat cardioversion, but TEE or a three-week delay will not be necessary since she is adequately and chronically anticoagulated.  Consideration can be given to antiarrhythmic therapy-she is a candidate for treatment with Flecainide, as she has only mild LVH, normal LV function and no known significant coronary disease.

## 2011-09-01 NOTE — Consult Note (Addendum)
Britton Bing, MD 09/01/2011 4:22 PM Pended  Patient ID: [name], female DOB: 1950-09-19, 61 y.o. MRN: [id number]  HPI: Patient seen urgently in the office today at her request for new onset of palpitations associated with weakness, malaise, fatigue, and dizziness. She has experienced no chest discomfort or dyspnea. No falls or syncope have occurred. Onset of symptoms was sudden at approximately 4:30 pm yesterday. Nothing she has done has exacerbated or improved her symptoms.  Prior to Admission medications   Medication  Sig  Start Date  End Date  Taking?  Authorizing Provider   citalopram (CELEXA) 20 MG tablet  Take 20 mg by mouth daily.     Historical Provider, MD   furosemide (LASIX) 20 MG tablet  Take 20 mg by mouth daily as needed.     Historical Provider, MD   glipiZIDE (GLUCOTROL) 10 MG tablet  Take 10 mg by mouth daily.     Historical Provider, MD   insulin glargine (LANTUS) 100 UNIT/ML injection  Inject 30 Units into the skin at bedtime.     Historical Provider, MD   insulin regular (HUMULIN R,NOVOLIN R) 100 units/mL injection  Inject into the skin as directed.     Historical Provider, MD   levothyroxine (SYNTHROID, LEVOTHROID) 125 MCG tablet  Take 125 mcg by mouth daily.     Historical Provider, MD   LORazepam (ATIVAN) 1 MG tablet  Take 1 mg by mouth 3 (three) times daily.     Historical Provider, MD   metaxalone (SKELAXIN) 800 MG tablet  Take 800 mg by mouth 3 (three) times daily.     Historical Provider, MD   metFORMIN (GLUCOPHAGE) 1000 MG tablet  Take 1,000 mg by mouth 2 (two) times daily.     Historical Provider, MD   metoCLOPramide (REGLAN) 10 MG tablet  Take 10 mg by mouth at bedtime.     Historical Provider, MD   nitroGLYCERIN (NITROSTAT) 0.4 MG SL tablet  Place 0.4 mg under the tongue as needed.     Historical Provider, MD   omeprazole (PRILOSEC) 20 MG capsule  Take 20 mg by mouth 2 (two) times daily.     Historical Provider, MD   ondansetron (ZOFRAN) 4 MG tablet  Take 4 mg by  mouth daily.     Historical Provider, MD   potassium chloride SA (K-DUR,KLOR-CON) 20 MEQ tablet  Take 20 mEq by mouth as needed.     Historical Provider, MD   simvastatin (ZOCOR) 40 MG tablet  Take 40 mg by mouth at bedtime.     Historical Provider, MD   warfarin (COUMADIN) 5 MG tablet  Take by mouth as directed.     Historical Provider, MD    Allergies   Allergen  Reactions   .  Codeine      REACTION: HALLUCINATIONS    Past medical history, social history, and family history reviewed and updated.  ROS: Denies orthopnea, PND, pedal edema, cough or sputum production.  PHYSICAL EXAM:  BP 144/76  Pulse 117  Temp(Src) 97.8 F (36.6 C) (Oral)  Resp 20  Ht 5\' 10"  (1.778 m)  Wt 110.9 kg (244 lb 7.8 oz)  BMI 35.08 kg/m2  SpO2 93% General-Well developed; no acute distress  Body habitus-proportionate weight and height  Neck-No JVD; no carotid bruits  Lungs-clear lung fields; resonant to percussion  Cardiovascular-normal PMI; normal S1 and S2; modest systolic murmur; rapid irregular rhythm.  Abdomen-normal bowel sounds; soft and non-tender without masses or organomegaly  Musculoskeletal-No  deformities, no cyanosis or clubbing  Neurologic-Normal cranial nerves; symmetric strength and tone  Skin-Warm, no significant lesions  Extremities-distal pulses intact; no edema  EKG: Atrial fibrillation with a rapid ventricular response; ventricular rate is 138 bpm; incomplete right bundle branch block; abnormal R wave progression, perhaps related to lead placement; leftward axis; nonspecific ST-T wave abnormality.   ASSESSMENT AND PLAN: Recurrent atrial fibrillation now present for years after persistent arrhythmia required DC cardioversion. Since she has developed congestive heart failure in the past despite a normal ejection fraction and has a generally fragile health status, initial therapy for her arrhythmia would best be provided in hospital. Dr. Renard Matter, who is covering for patient's PCP, Dr.  Freida Busman, contacted and agrees to serve as attending physician for an admission to [facility].  Pt is tolerating her arrhythmia well. A loading dose of intravenous diltiazem will be administered followed by oral dosing. An echocardiogram and TSH level are pending as well as a BNP level and basic laboratory studies. Chest x-ray will be obtained. She is anticoagulated with warfarin, which will be continued. Depending upon how well she tolerates her arrhythmia after heart rate has been controlled, consideration can be given to cardioversion, either TEE guided or deferred or treatment with an antiarrhythmic, possibly flecainide.    [place] Bing, MD  09/01/2011  4:19 PM

## 2011-09-01 NOTE — Progress Notes (Signed)
ANTICOAGULATION CONSULT NOTE - Initial Consult  Pharmacy Consult for Warfarin Indication: atrial fibrillation  Allergies  Allergen Reactions  . Codeine     REACTION: HALLUCINATIONS    Patient Measurements: Height: 5\' 10"  (177.8 cm) Weight: 244 lb 7.8 oz (110.9 kg) IBW/kg (Calculated) : 68.5   Vital Signs: Temp: 98.3 F (36.8 C) (02/28 2110) Temp src: Oral (02/28 2110) BP: 133/86 mmHg (02/28 2110) Pulse Rate: 97  (02/28 2110)  Labs:  Basename 09/01/11 1813  HGB 9.1*  HCT 30.0*  PLT 314  APTT --  LABPROT 24.1*  INR 2.12*  HEPARINUNFRC --  CREATININE 0.56  CKTOTAL --  CKMB --  TROPONINI --   Estimated Creatinine Clearance: 98.4 ml/min (by C-G formula based on Cr of 0.56).  Medical History: Past Medical History  Diagnosis Date  . Chest pain     Negative cardiac catheterization in 2002; negative stress nuclear study in 2008  . Congestive heart failure     Normal LV systolic function  . PAF (paroxysmal atrial fibrillation) 2009  . Chronic anticoagulation   . Diabetes mellitus, type 2     Insulin therapy; exacerbated by prednisone  . Syncope     Admitted 05/2009; magnetic resonance imagin/ MRA - negative; etiology thought to be orthostasis secondary to drugs and dehydration  . GERD (gastroesophageal reflux disease)   . IBS (irritable bowel syndrome)   . Hiatal hernia   . Gastroparesis   . Hyperlipidemia   . Hypertension   . Hypothyroid   . Chronic LBP     Surgical intervention in 1996  . Obesity   . OSA on CPAP   . Anemia     H/H of 10/30 with a normal MCV in 12/09    Medications:  Prescriptions prior to admission  Medication Sig Dispense Refill  . citalopram (CELEXA) 20 MG tablet Take 20 mg by mouth daily.        Marland Kitchen glipiZIDE (GLUCOTROL) 10 MG tablet Take 10 mg by mouth 2 (two) times daily before a meal.       . insulin glargine (LANTUS) 100 UNIT/ML injection Inject 50 Units into the skin at bedtime.       Marland Kitchen levothyroxine (SYNTHROID, LEVOTHROID) 125  MCG tablet Take 125 mcg by mouth daily.        Marland Kitchen LORazepam (ATIVAN) 1 MG tablet Take 1 mg by mouth 3 (three) times daily.        . metaxalone (SKELAXIN) 800 MG tablet Take 800 mg by mouth 3 (three) times daily.        . metFORMIN (GLUCOPHAGE) 1000 MG tablet Take 1,000 mg by mouth 2 (two) times daily.        Marland Kitchen omeprazole (PRILOSEC) 20 MG capsule Take 20 mg by mouth 2 (two) times daily.        . potassium chloride SA (K-DUR,KLOR-CON) 20 MEQ tablet Take 20 mEq by mouth daily.       . simvastatin (ZOCOR) 40 MG tablet Take 40 mg by mouth at bedtime.        Marland Kitchen warfarin (COUMADIN) 5 MG tablet Take 2.5 mg by mouth daily. At 6pm (1800)      . nitroGLYCERIN (NITROSTAT) 0.4 MG SL tablet Place 0.4 mg under the tongue as needed.          Assessment: Okay for Protocol INR at Goal.    Goal of Therapy:  INR 2-3   Plan:  Warfarin 2.5 mg Daily (already ordered). Daily PT/INR (already ordered).  Madilyn Fireman,  Suszanne Conners 09/01/2011,9:27 PM

## 2011-09-01 NOTE — Assessment & Plan Note (Signed)
Patient reports a 40 pound weight loss.  No weight contained in conjunction with today's visit.

## 2011-09-02 ENCOUNTER — Encounter (HOSPITAL_COMMUNITY): Payer: Self-pay | Admitting: Gastroenterology

## 2011-09-02 DIAGNOSIS — I10 Essential (primary) hypertension: Secondary | ICD-10-CM

## 2011-09-02 DIAGNOSIS — I319 Disease of pericardium, unspecified: Secondary | ICD-10-CM

## 2011-09-02 DIAGNOSIS — D509 Iron deficiency anemia, unspecified: Secondary | ICD-10-CM

## 2011-09-02 DIAGNOSIS — R634 Abnormal weight loss: Secondary | ICD-10-CM

## 2011-09-02 DIAGNOSIS — I4891 Unspecified atrial fibrillation: Secondary | ICD-10-CM

## 2011-09-02 LAB — PROTIME-INR
INR: 2.22 — ABNORMAL HIGH (ref 0.00–1.49)
Prothrombin Time: 25 seconds — ABNORMAL HIGH (ref 11.6–15.2)

## 2011-09-02 LAB — RETICULOCYTES
RBC.: 3.83 MIL/uL — ABNORMAL LOW (ref 3.87–5.11)
Retic Count, Absolute: 61.6 10*3/uL (ref 19.0–186.0)
Retic Ct Pct: 1.6 % (ref 0.4–3.1)
Retic Ct Pct: 1.8 % (ref 0.4–3.1)

## 2011-09-02 LAB — URINALYSIS, ROUTINE W REFLEX MICROSCOPIC
Glucose, UA: NEGATIVE mg/dL
Hgb urine dipstick: NEGATIVE
Leukocytes, UA: NEGATIVE
Protein, ur: NEGATIVE mg/dL
Specific Gravity, Urine: 1.02 (ref 1.005–1.030)
pH: 5.5 (ref 5.0–8.0)

## 2011-09-02 LAB — FERRITIN
Ferritin: 9 ng/mL — ABNORMAL LOW (ref 10–291)
Ferritin: 10 ng/mL (ref 10–291)

## 2011-09-02 LAB — URINE MICROSCOPIC-ADD ON

## 2011-09-02 LAB — LIPID PANEL
Cholesterol: 77 mg/dL (ref 0–200)
LDL Cholesterol: 24 mg/dL (ref 0–99)
VLDL: 18 mg/dL (ref 0–40)

## 2011-09-02 LAB — GLUCOSE, CAPILLARY
Glucose-Capillary: 140 mg/dL — ABNORMAL HIGH (ref 70–99)
Glucose-Capillary: 196 mg/dL — ABNORMAL HIGH (ref 70–99)

## 2011-09-02 LAB — VITAMIN B12: Vitamin B-12: 294 pg/mL (ref 211–911)

## 2011-09-02 MED ORDER — FUROSEMIDE 10 MG/ML IJ SOLN
40.0000 mg | Freq: Once | INTRAMUSCULAR | Status: AC
  Administered 2011-09-02: 40 mg via INTRAVENOUS
  Filled 2011-09-02: qty 4

## 2011-09-02 MED ORDER — PATIENT'S GUIDE TO USING COUMADIN BOOK
Freq: Once | Status: AC
  Administered 2011-09-02: 11:00:00
  Filled 2011-09-02: qty 1

## 2011-09-02 MED ORDER — FUROSEMIDE 20 MG PO TABS: 20.0000 mg | ORAL_TABLET | Freq: Every day | ORAL | Status: DC

## 2011-09-02 MED ORDER — ATORVASTATIN CALCIUM 10 MG PO TABS
20.0000 mg | ORAL_TABLET | Freq: Every day | ORAL | Status: DC
  Administered 2011-09-02 – 2011-09-03 (×2): 20 mg via ORAL
  Filled 2011-09-02 (×2): qty 2

## 2011-09-02 NOTE — Progress Notes (Signed)
*  PRELIMINARY RESULTS* Echocardiogram 2D Echocardiogram has been performed.  Conrad  09/02/2011, 11:22 AM

## 2011-09-02 NOTE — Progress Notes (Addendum)
SUBJECTIVE:No complaints of chest pain, palpitations or dyspnea.  Active Problems:  Atrial fibrillation  CONGESTIVE HEART FAILURE, MILD  ANEMIA, IRON DEFICIENCY  HYPERTENSION   LABS: Basic Metabolic Panel:  [facility] 09/01/11 1813  NA 139  K 4.4  CL 108  CO2 22  GLUCOSE 163*  BUN 12  CREATININE 0.56  CALCIUM 9.6  MG --  PHOS --   Liver Function Tests:  [facility] 09/01/11 1813  AST 16  ALT 12  ALKPHOS 50  BILITOT 0.2*  PROT 6.8  ALBUMIN 3.5   CBC:  Basename 09/01/11 1813  WBC 6.4  NEUTROABS --  HGB 9.1*  HCT 30.0*  MCV 77.5*  PLT 314   Fasting Lipid Panel:  Basename 09/02/11 0446  CHOL 77  HDL 35*  LDLCALC 24  TRIG 88  CHOLHDL 2.2  LDLDIRECT --   Thyroid Function Tests:  Basename 09/01/11 1813  TSH 0.446  T4TOTAL --  T3FREE --  THYROIDAB --   BNP: 1667.0   RADIOLOGY: Dg Chest 2 View  09/02/2011  *RADIOLOGY REPORT*   IMPRESSION: Vascular congestion and mild cardiomegaly, with mildly increased interstitial markings, raising question for minimal interstitial edema.  Original Report Authenticated By: Tonia Ghent, M.D.   PHYSICAL EXAM BP 143/97  Pulse 115  Temp(Src) 97.8 F (36.6 C) (Oral)  Resp 20  Ht 5\' 10"  (1.778 m)  Wt 242 lb 1 oz (109.8 kg)  BMI 34.73 kg/m2  SpO2 92% General: Well developed, well nourished, in no acute distress Head: Eyes PERRLA, No xanthomas.   Normal cephalic and atramatic  Lungs: Clear bilaterally to auscultation and percussion. Heart: HRRR S1 S2, No MRG .  Pulses are 2+ & equal.            No carotid bruit. No JVD.  No abdominal bruits. No femoral bruits. Abdomen: Bowel sounds are positive, abdomen soft and non-tender without masses or                  Hernia's noted. Msk:  Back normal, normal gait. Normal strength and tone for age. Extremities: No clubbing, cyanosis or edema.  DP +1 Neuro: Alert and oriented X 3. Psych:  Good affect, responds appropriately  TELEMETRY: Reviewed telemetry pt in Atrial fib  with rates 90's-115 bpm.  ASSESSMENT AND PLAN:  1. Atrial fibrillation with RVR: She was seen in the office yesterday with malaise and fatigue along with chest discomfort. The patient was found to be in A. fib RVR with a rate of 138 beats per minute with an incomplete right bundle branch block. She will set evidence of CHF. The patient was admitted and received a dose of IV diltiazem. She has a history of cardioversion at 2009. She has been placed on by mouth Cardizem 60 mg Q8 hours. after initial IV dose. Heart rate remains elevated in the 90s to 115 bpm. She continues on Coumadin INR 2.22 this a.m.. Most recent echo per the chart reveals the normal ejection fraction with diastolic dysfunction. TSH is within normal limits. She has mild anemia with a hemoglobin of 9.1, which is down about 2-1/2 g since June of 2011. Will Hemoccult stools and evaluate for bleeding as she is on Coumadin.  2. Diastolic CHF. Do not see that she has been given any diuretics. Her BNP was elevated at 1667, along with chest x-ray demonstrating an interstitial edema, although minimal. Afib with RVR probable etiology. We will add a low-dose Lasix to assist in diuresis, watching blood pressure and electrolytes. Her current  creatinine is 0.56.. she is on a home dose of 20 mg daily when necessary she may need to be on a daily dose on discharge. We will begin diuresis and watch.  3. Iron Deficiency Anemia: I do not see that she is on iron replacement here in the hospital. Iron profile will be ordered to evaluate status. This may also be contributing to fluid retention.   Bettey Mare. Lyman Bishop NP [facility] 09/02/2011, 10:48 AM   Attending note:  Patient seen and examined. Reviewed records and databases record Ms. Lawrence. She remains in atrial fibrillation, although heart rate is coming down, most recently in the 90s on my examination. She feels somewhat better.  On my examination patient afebrile, heart rate in the 90s  in atrial fibrillation, blood pressure 143/90. Lungs are clear, cardiac exam with irregularly irregular rhythm, no pitting edema.  Lab work reviewed, potassium 4.4, BUN 12, creatinine 0.5, BNP 1667, LDL 24, hemoglobin 9.1, platelets 314, INR 2.2. Echocardiogram is pending at this time.  If there has been no major change in LVEF, and patient improves clinically with heart rate control by tomorrow, general plan would be continuation of Cardizem, converting to an equivalent long-acting dose, and continuation of Coumadin. It is not clear that she will require cardioversion as an inpatient, and can have close outpatient followup to discuss cardioversion, and potential for antiarrhythmic therapy. It does not sound like she has had frequent breakthrough episodes of atrial fibrillation however since 2009, it might be that a "pill in the pocket" approach is reasonable.  Jonelle Sidle, M.D., F.A.C.C.

## 2011-09-02 NOTE — Progress Notes (Signed)
Blood pressure 116/71 with HR 138 upon arrival to office.

## 2011-09-02 NOTE — H&P (Signed)
[name], [name]                 ACCOUNT NO.:  [id number]  MEDICAL RECORD NO.:  [id number]  LOCATION:  [id number]                          FACILITY:  [facility]  PHYSICIAN:  [name], M.D.DATE OF BIRTH:  [birth year]  DATE OF ADMISSION:  09/01/2011 DATE OF DISCHARGE:  LH                             HISTORY & PHYSICAL   This 61 year old woke up early yesterday morning with palpitations.  She was seen by Dr. Omak Bing, cardiologist, yesterday and found to be in continued atrial fibrillation, and weakened so was admitted to the hospital.  She rarely has palpitations.  She said she had been trying to lose weight.  She has had chronic diarrhea from December, 2012 and by her history has lost 40 pounds since then.  She denies black tarry stools.  No abdominal pain.  She is vague about whether she has had colonoscopy or not.  She has had no nausea or vomiting.  She has a long history of diabetes, benign essential hypertension, hypercholesterolemia, hypothyroidism, and reflux esophagitis with a Barrett's esophagus, at other times, chronic times constipation and irritable bowel syndrome, low back pain, obstructive sleep apnea, and chronic anticoagulation with warfarin.  CURRENT MEDICATIONS: 1. Citalopram 20 mg daily. 2. Glipizide 10 mg b.i.d. 3. Lantus insulin 50 units at bedtime. 4. Levothyroxine 125 mcg daily. 5. Lorazepam 1 mg t.i.d. 6. Metaxalone 800 mg t.i.d. 7. Metformin 1000 mg b.i.d. 8. Nitroglycerin 0.4 mg p.r.n. chest pain. 9. Omeprazole 20 mg b.i.d. 10.Potassium chloride 20 mg daily. 11.Simvastatin 40 mg at bedtime. 12.Warfarin currently 2.5 mg daily.  ALLERGIES:  She has intolerance to CODEINE.  PHYSICAL EXAMINATION:  VITAL SIGNS:  Today she had a temperature 97.8, pulse 115, respiratory 20, blood pressure 143/97. GENERAL:  She was sitting up in bed.  Her color is good.  She showed no signs of dyspnea. SKIN:  Turgor was normal. HEART:  Had an irregular  irregularity, rate of about 90 on my exam. LUNGS:  Her lungs appear to be clear throughout. ABDOMEN: Soft and obese without organomegaly or mass or tenderness and there was minimal edema of the ankles.  LABORATORY DATA:  Her admission CBC showed a white cell count 6400, hemoglobin 9.1 with MCV of 77.5.  Her INR is 2.12.  TSH 0.446.  Her BNP was 1667, and her CMP was normal except for glucose of 163.  Cholesterol 77, triglycerides 88, HDL 35, LDL of 24.  Her chest x-ray showed vascular congestion and mild cardiomegaly with mildly increased interstitial markings.  A 12-lead EKG showed atrial fibrillation with rapid ventricular response of 101.  This is compared to the EKG of July 15, 2009 and at which time, she was in normal sinus rhythm.  There is a question of lateral infarct.  ADMISSION DIAGNOSES: 1. Atrial fibrillation with rapid ventricular response. 2. Malaise and fatigue. 3. Presumptive iron deficiency anemia. 4. Long-term anticoagulation with warfarin. 5. Type 2 diabetes. 6. Morbid obesity. 7. History of recent significant weight loss. 8. Benign essential hypertension. 9. Hypercholesterolemia. 10.Obstructive sleep apnea. She has been seen by Dr. Dietrich Pates, cardiologist.  I recommend we check a serum ferritin on her, have GI see her.  Hemoccults x3 are  pending.  I discussed some of this with the patient and her daughter.  She has been on chronic warfarin and may have had just a slow chronic bleed on this to drop her iron stores.  Alternatively, she could have a small ulcer or a colonic lesion.     [name], M.D.     SDK/MEDQ  D:  09/02/2011  T:  09/02/2011  Job:  [id number]

## 2011-09-02 NOTE — Consult Note (Signed)
Referring Provider: [name], MD Primary Care Physician:  [name], MD, MD Primary Gastroenterologist:  Jonette Eva, MD  Reason for Consultation:  anemia, weight loss, diarrhea  HPI: [name] is a 61 y.o. female admitted yesterday after being seen in cardiologist office with c/o malaise and fatigue, chest discomfort. HR 138, AFib RVR, incomplete right bundle branch block. On coumadin chronically. INR therapeutic.   We were asked to see patient today for weight loss, anemia, chronic intermittent diarrhea. Dr. [name] spoke with me. He was very concerned about 40 pound weight loss. Patient tells me today (daughter present and agrees) that her weight loss is intentional. She has been reducing portion size. We have seen patient in past for gastroparesis, GERD with Barrett's, chronic intermittent diarrhea.   She states since 06/2011 she has had increased problems with nausea, diarrhea. Some days up to five loose stools. Denies nocturnal BMs. No melena, brbpr.  Increased nausea, early satiety, no vomiting. Abd pain with diarrhea. Some stool tests negative as outpatient in December per daughter but afraid she collected them incorrectly.. Last week, having just couple of stools daily. Stools very loose. No dysphagia. No heartburn. Takes omeprazole 20mg  BID and reglan 5mg  before meals (not on drug list). No nsaids/asa.   Last tcs ?2002, no polyps. Last EGD 2008. Overdue for surveillance.   Prior to Admission medications   Medication Sig Start Date End Date Taking? Authorizing Provider  citalopram (CELEXA) 20 MG tablet Take 20 mg by mouth daily.     Yes Historical Provider, MD  glipiZIDE (GLUCOTROL) 10 MG tablet Take 10 mg by mouth 2 (two) times daily before a meal.    Yes Historical Provider, MD  insulin glargine (LANTUS) 100 UNIT/ML injection Inject 50 Units into the skin at bedtime.    Yes Historical Provider, MD  levothyroxine (SYNTHROID, LEVOTHROID) 125 MCG tablet Take 125  mcg by mouth daily.     Yes Historical Provider, MD  LORazepam (ATIVAN) 1 MG tablet Take 1 mg by mouth 3 (three) times daily.     Yes Historical Provider, MD  metaxalone (SKELAXIN) 800 MG tablet Take 800 mg by mouth 3 (three) times daily.     Yes Historical Provider, MD  metFORMIN (GLUCOPHAGE) 1000 MG tablet Take 1,000 mg by mouth 2 (two) times daily.     Yes Historical Provider, MD  omeprazole (PRILOSEC) 20 MG capsule Take 20 mg by mouth 2 (two) times daily.     Yes Historical Provider, MD  potassium chloride SA (K-DUR,KLOR-CON) 20 MEQ tablet Take 20 mEq by mouth daily.    Yes Historical Provider, MD  simvastatin (ZOCOR) 40 MG tablet Take 40 mg by mouth at bedtime.     Yes Historical Provider, MD  warfarin (COUMADIN) 5 MG tablet Take 2.5 mg by mouth daily. At 6pm (1800)   Yes Historical Provider, MD  nitroGLYCERIN (NITROSTAT) 0.4 MG SL tablet Place 0.4 mg under the tongue as needed.      Historical Provider, MD    Current Facility-Administered Medications  Medication Dose Route Frequency Provider Last Rate Last Dose  . atorvastatin (LIPITOR) tablet 20 mg  20 mg Oral QHS [name], MD      . citalopram (CELEXA) tablet 20 mg  20 mg Oral Daily Angus Edilia Bo, MD   20 mg at 09/02/11 0930  . diltiazem (CARDIZEM) injection 20 mg  20 mg Intravenous Once [employer]. Rothbart, MD   20 mg at 09/01/11 2020  . diltiazem (CARDIZEM) tablet  60 mg  60 mg Oral Once [name]. Rothbart, MD   60 mg at 09/01/11 2020  . diltiazem (CARDIZEM) tablet 60 mg  60 mg Oral TID [name], MD   60 mg at 09/02/11 0931  . furosemide (LASIX) injection 40 mg  40 mg Intravenous Once Joni Reining, NP      . furosemide (LASIX) tablet 20 mg  20 mg Oral Daily Joni Reining, NP      . glipiZIDE (GLUCOTROL) tablet 10 mg  10 mg Oral BID AC Angus Edilia Bo, MD   10 mg at 09/02/11 0850  . insulin glargine (LANTUS) injection 50 Units  50 Units Subcutaneous QHS Alice Reichert, MD   50 Units at 09/01/11 2204  .  levothyroxine (SYNTHROID, LEVOTHROID) 125 mcg  125 mcg Oral QAC breakfast Alice Reichert, MD   125 mcg at 09/02/11 0849  . LORazepam (ATIVAN) tablet 1 mg  1 mg Oral TID Alice Reichert, MD   1 mg at 09/02/11 0930  . metaxalone (SKELAXIN) tablet 800 mg  800 mg Oral TID Alice Reichert, MD   800 mg at 09/02/11 0930  . pantoprazole (PROTONIX) EC tablet 40 mg  40 mg Oral Q1200 Angus Edilia Bo, MD   40 mg at 09/01/11 1825  . patient's guide to using coumadin book   Does not apply Once [name], MD      . potassium chloride SA (K-DUR,KLOR-CON) CR tablet 20 mEq  20 mEq Oral Daily Alice Reichert, MD   20 mEq at 09/02/11 0931  . sodium chloride 0.9 % injection        10 mL at 09/01/11 1645  . warfarin (COUMADIN) tablet 2.5 mg  2.5 mg Oral q1800 Alice Reichert, MD   2.5 mg at 09/01/11 2019  . Warfarin - Pharmacist Dosing Inpatient   Does not apply [id number] [name], MD      . DISCONTD: 0.9 %  sodium chloride infusion   Intravenous Continuous Alice Reichert, MD 100 mL/hr at 09/01/11 1644    . DISCONTD: simvastatin (ZOCOR) tablet 40 mg  40 mg Oral QHS Alice Reichert, MD   40 mg at 09/01/11 2204    Allergies as of 09/01/2011 - Review Complete 09/01/2011  Allergen Reaction Noted  . Codeine      Past Medical History  Diagnosis Date  . Chest pain     Negative cardiac catheterization in 2002; negative stress nuclear study in 2008  . Congestive heart failure     Normal LV systolic function  . PAF (paroxysmal atrial fibrillation) 2009  . Chronic anticoagulation   . Diabetes mellitus, type 2     Insulin therapy; exacerbated by prednisone  . Syncope     Admitted 05/2009; magnetic resonance imagin/ MRA - negative; etiology thought to be orthostasis secondary to drugs and dehydration  . GERD (gastroesophageal reflux disease)   . IBS (irritable bowel syndrome)   . Hiatal hernia   . Gastroparesis     99% retention 05/2008 on GES  . Hyperlipidemia   . Hypertension   . Hypothyroid     . Chronic LBP     Surgical intervention in 1996  . Obesity   . OSA on CPAP   . Anemia     H/H of 10/30 with a normal MCV in 12/09  . Barrett's esophagus     Diagnosed 1995. Last EGD 2008.     Past Surgical History  Procedure Date  .  Cardiac catheterization 2002  . Cardiovascular stress test 2008    Stress nuclear study  . Back surgery 1996  . Laparoscopic cholecystectomy 1990s  . Total abdominal hysterectomy 1999  . Laminectomy 1995    L4-L5  . Carpal tunnel release 1994  . Esophagogastroduodenoscopy 2008    Barrett's without dysplasia. esphagus dilated. antral erosions, h.pylori serologies negative.  . Colonoscopy     Patient reports normal TCS around 10 years ago.    Family History  Problem Relation Age of Onset  . Hypertension Mother   . Alzheimer's disease Mother   . Stroke Mother   . Heart attack Mother   . Heart disease Neg Hx   . Hypertension Other   . Colon cancer Neg Hx     History   Social History  . Marital Status: Married    Spouse Name: N/A    Number of Children: N/A  . Years of Education: N/A   Occupational History  . 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     Social History Main Topics  . Smoking status: Former Games developer  . Smokeless tobacco: Former Neurosurgeon   Comment: Remote  . Alcohol Use: No     last etoh one year ago, never frequent  . Drug Use: No  . Sexually Active: Yes    Birth Control/ Protection: None   Other Topics Concern  . Not on file   Social History Narrative   Sedentary4 children, "blended family"     ROS:  General: See HPI. C/O fatigue, weakness. Eyes: Negative for vision changes.  ENT: Negative for hoarseness, difficulty swallowing , nasal congestion. CV: Negative for chest pain, angina. Positive for palpitations, dyspnea on exertion, peripheral edema.  Respiratory: Positive for DOE. Negative for dyspnea at rest, cough, sputum, wheezing.  GI: See history of present illness. GU:  Negative for dysuria,  hematuria, urinary incontinence, urinary frequency, nocturnal urination.  MS: Negative for joint pain, low back pain.  Derm: Negative for rash or itching.  Neuro: Negative for weakness, abnormal sensation, seizure, frequent headaches, memory loss, confusion.  Psych: Negative for anxiety, depression, suicidal ideation, hallucinations.  Endo: Negative for unusual weight change. Patient reports intentional weight loss of forty pounds. Heme: Negative for bruising or bleeding. Allergy: Negative for rash or hives.       Physical Examination: Vital signs in last 24 hours: Temp:  [97.8 F (36.6 C)-98.3 F (36.8 C)] 97.8 F (36.6 C) (03/01 0603) Pulse Rate:  [97-117] 115  (03/01 0603) Resp:  [20] 20  (03/01 0603) BP: (133-144)/(76-97) 143/97 mmHg (03/01 0603) SpO2:  [92 %-94 %] 92 % (03/01 0603) Weight:  [242 lb 1 oz (109.8 kg)-244 lb 7.8 oz (110.9 kg)] 242 lb 1 oz (109.8 kg) (03/01 0603) Last BM Date: 09/01/11  General: Well-nourished, well-developed obese in no acute distress. Daughter at bedside.  Head: Normocephalic, atraumatic.   Eyes: Conjunctiva pale, no icterus. Mouth: Oropharyngeal mucosa moist and pink , no lesions erythema or exudate. Neck: Supple without thyromegaly, masses, or lymphadenopathy.  Lungs: Clear to auscultation bilaterally.  Heart: Regular rate and rhythm, no murmurs rubs or gallops.  Abdomen: Bowel sounds are normal, mild lower abd tenderness, nondistended, no hepatosplenomegaly or masses, no abdominal bruits or    hernia , no rebound or guarding. Exam limited due to body habitus.   Rectal: Not performed. Extremities: Trace lower extremity edema. No clubbing, deformity.  Neuro: Alert and oriented x 4 , grossly normal neurologically.  Skin: Warm and  dry, no rash or jaundice.   Psych: Alert and cooperative, normal mood and affect.        Intake/Output from previous day: 02/28 0701 - 03/01 0700 In: 1450 [P.O.:240; I.V.:10] Out: 0  Intake/Output this shift:     Lab Results: CBC  Basename 09/01/11 1813  WBC 6.4  HGB 9.1*  HCT 30.0*  MCV 77.5*  PLT 314   Hgb 11.8 in 12/2010  BMET  Basename 09/01/11 1813  NA 139  K 4.4  CL 108  CO2 22  GLUCOSE 163*  BUN 12  CREATININE 0.56  CALCIUM 9.6   LFT  Basename 09/01/11 1813  BILITOT 0.2*  BILIDIR --  IBILI --  ALKPHOS 50  AST 16  ALT 12  PROT 6.8  ALBUMIN 3.5     PT/INR  Basename 09/02/11 0446 09/01/11 1813  LABPROT 25.0* 24.1*  INR 2.22* 2.12*      Imaging Studies: Dg Chest 2 View  09/02/2011  *RADIOLOGY REPORT*  Clinical Data: Atrial fibrillation.  History of diabetes.  CHEST - 2 VIEW  Comparison: Chest radiograph performed 07/15/2009  Findings: The lungs are well-aerated.  Vascular congestion is noted, with mildly increased interstitial markings, raising concern for minimal interstitial edema.  There is no evidence of pleural effusion or pneumothorax.  The heart is mildly enlarged; calcification is noted along the aortic arch and tracheobronchial tree.  No acute osseous abnormalities are seen.  IMPRESSION: Vascular congestion and mild cardiomegaly, with mildly increased interstitial markings, raising question for minimal interstitial edema.  Original Report Authenticated By: Tonia Ghent, M.D.  Pierre.Alas week]   Impression: 61 y/o female admitted with AFib with RVR. Noted drop in Hgb compared to last 12/2010. Microcytic anemia. Hemoccult status unknown at this time. Patient denies overt GI bleeding. She has had some nausea, early satiety, intentional (per patient) weight loss, intermittent diarrhea/abd pain. She has h/o GERD with Barrett's and is overdue for surveillance. Gastroparesis, reports compliant with reglan, without side effects. H/O IBS. Overdue for colonoscopy.   She has microcytic anemia in setting of chronic anticoagulation. Multiple GI symptoms. Would recommend evaluation during this admission if cardiology agreeable. First and foremost, I told the patient her Afib with  RVR needs to be addressed.   Plan: 1. PPI. 2. F/u anemia labs when available. 3. Heme stool. 4. Stool for C.Diff if recurrent diarrhea. 5. Would recommend EGD and colonoscopy this admission if possible. Currently patient is anticoagulated with coumadin, remains in Afib with improved heart rate. Would appreciate cardiology input. I have called and left message with Joni Reining regarding patient.   I would like to thank Dr. [name] for allowing [place] to take part in the care of this nice patient.    LOS: 1 day   Tana Coast  09/02/2011, 12:55 PM

## 2011-09-02 NOTE — Patient Instructions (Signed)
See Dr Marvel Plan note for details of patient admission.  Report given to floor and patient taken to room via wheelchair.  No acute distress noted.

## 2011-09-02 NOTE — Progress Notes (Signed)
ANTICOAGULATION CONSULT NOTE   Pharmacy Consult for Warfarin Indication: atrial fibrillation (continuation of home therapy)  Allergies  Allergen Reactions  . Codeine     REACTION: HALLUCINATIONS    Patient Measurements: Height: 5\' 10"  (177.8 cm) Weight: 242 lb 1 oz (109.8 kg) IBW/kg (Calculated) : 68.5   Vital Signs: Temp: 97.8 F (36.6 C) (03/01 0603) Temp src: Oral (03/01 0603) BP: 143/97 mmHg (03/01 0603) Pulse Rate: 115  (03/01 0603)  Labs:  Basename 09/02/11 0446 09/01/11 1813  HGB -- 9.1*  HCT -- 30.0*  PLT -- 314  APTT -- --  LABPROT 25.0* 24.1*  INR 2.22* 2.12*  HEPARINUNFRC -- --  CREATININE -- 0.56  CKTOTAL -- --  CKMB -- --  TROPONINI -- --   Estimated Creatinine Clearance: 97.8 ml/min (by C-G formula based on Cr of 0.56).  Medical History: Past Medical History  Diagnosis Date  . Chest pain     Negative cardiac catheterization in 2002; negative stress nuclear study in 2008  . Congestive heart failure     Normal LV systolic function  . PAF (paroxysmal atrial fibrillation) 2009  . Chronic anticoagulation   . Diabetes mellitus, type 2     Insulin therapy; exacerbated by prednisone  . Syncope     Admitted 05/2009; magnetic resonance imagin/ MRA - negative; etiology thought to be orthostasis secondary to drugs and dehydration  . GERD (gastroesophageal reflux disease)   . IBS (irritable bowel syndrome)   . Hiatal hernia   . Gastroparesis   . Hyperlipidemia   . Hypertension   . Hypothyroid   . Chronic LBP     Surgical intervention in 1996  . Obesity   . OSA on CPAP   . Anemia     H/H of 10/30 with a normal MCV in 12/09    Medications:  Prescriptions prior to admission  Medication Sig Dispense Refill  . citalopram (CELEXA) 20 MG tablet Take 20 mg by mouth daily.        Marland Kitchen glipiZIDE (GLUCOTROL) 10 MG tablet Take 10 mg by mouth 2 (two) times daily before a meal.       . insulin glargine (LANTUS) 100 UNIT/ML injection Inject 50 Units into the  skin at bedtime.       Marland Kitchen levothyroxine (SYNTHROID, LEVOTHROID) 125 MCG tablet Take 125 mcg by mouth daily.        Marland Kitchen LORazepam (ATIVAN) 1 MG tablet Take 1 mg by mouth 3 (three) times daily.        . metaxalone (SKELAXIN) 800 MG tablet Take 800 mg by mouth 3 (three) times daily.        . metFORMIN (GLUCOPHAGE) 1000 MG tablet Take 1,000 mg by mouth 2 (two) times daily.        Marland Kitchen omeprazole (PRILOSEC) 20 MG capsule Take 20 mg by mouth 2 (two) times daily.        . potassium chloride SA (K-DUR,KLOR-CON) 20 MEQ tablet Take 20 mEq by mouth daily.       . simvastatin (ZOCOR) 40 MG tablet Take 40 mg by mouth at bedtime.        Marland Kitchen warfarin (COUMADIN) 5 MG tablet Take 2.5 mg by mouth daily. At 6pm (1800)      . nitroGLYCERIN (NITROSTAT) 0.4 MG SL tablet Place 0.4 mg under the tongue as needed.          Assessment: Continuation of home therapy. INR within desired target. No bleeding complications noted.    Goal  of Therapy:  INR 2-3   Plan:  Warfarin 2.5 mg Daily (already ordered). Daily PT/INR (already ordered).  Gilman Buttner, [place] J 09/02/2011,10:52 AM

## 2011-09-02 NOTE — Progress Notes (Signed)
Notified Dr. Jena Gauss of patients new consult for possible GI bleed.  He states that he would see the patient.  I placed the patient on the list.

## 2011-09-03 LAB — PROTIME-INR: Prothrombin Time: 25.1 seconds — ABNORMAL HIGH (ref 11.6–15.2)

## 2011-09-03 LAB — CBC
Hemoglobin: 9 g/dL — ABNORMAL LOW (ref 12.0–15.0)
MCH: 23.2 pg — ABNORMAL LOW (ref 26.0–34.0)
MCHC: 30.5 g/dL (ref 30.0–36.0)
Platelets: 281 10*3/uL (ref 150–400)
RDW: 17.8 % — ABNORMAL HIGH (ref 11.5–15.5)

## 2011-09-03 LAB — GLUCOSE, CAPILLARY
Glucose-Capillary: 159 mg/dL — ABNORMAL HIGH (ref 70–99)
Glucose-Capillary: 189 mg/dL — ABNORMAL HIGH (ref 70–99)

## 2011-09-03 LAB — PREPARE RBC (CROSSMATCH)

## 2011-09-03 LAB — DIFFERENTIAL
Basophils Absolute: 0 10*3/uL (ref 0.0–0.1)
Basophils Relative: 1 % (ref 0–1)
Eosinophils Absolute: 0.2 10*3/uL (ref 0.0–0.7)
Monocytes Relative: 10 % (ref 3–12)
Neutro Abs: 2 10*3/uL (ref 1.7–7.7)
Neutrophils Relative %: 34 % — ABNORMAL LOW (ref 43–77)

## 2011-09-03 LAB — BASIC METABOLIC PANEL
BUN: 9 mg/dL (ref 6–23)
Calcium: 9.2 mg/dL (ref 8.4–10.5)
GFR calc Af Amer: >90 mL/min (ref >90)
GFR calc non Af Amer: >90 mL/min (ref >90)
Potassium: 3.8 mEq/L (ref 3.5–5.1)
Sodium: 137 mEq/L (ref 135–145)

## 2011-09-03 MED ORDER — CIPROFLOXACIN HCL 250 MG PO TABS
500.0000 mg | ORAL_TABLET | Freq: Two times a day (BID) | ORAL | Status: DC
  Administered 2011-09-03 – 2011-09-04 (×3): 500 mg via ORAL
  Filled 2011-09-03 (×3): qty 2

## 2011-09-03 NOTE — Progress Notes (Signed)
Notified  the MD prior to her blood administration the patients BP was 86/56 with HR 99 and after the transfusion was completed BP 90/69 with HR 128. Discussed patient care with Dr. [name] in regards to Dr. Jeanella Flattery recommendations for EGD/Colonoscopy.  MD states that we will work on her BP and heart rate and more than likely she will benefit as coming back in to have the test, due to needing to work with coumadin and inr levels prior to the test.  New orders given and followed.

## 2011-09-03 NOTE — Progress Notes (Signed)
Spoke with MD [name] about BP 108/73 HR 101 due to BP being low earlier states to Hold Cardizem and he will address in the morning.

## 2011-09-03 NOTE — Progress Notes (Signed)
[name], [name]                 ACCOUNT NO.:  [id number]  MEDICAL RECORD NO.:  [id number]  LOCATION:  [id number]                          FACILITY:  [facility]  PHYSICIAN:  [name], M.D.DATE OF BIRTH:  20-May-1951  DATE OF PROCEDURE: DATE OF DISCHARGE:                                PROGRESS NOTE   SUBJECTIVE:  Still feels pretty tired.  She is on a monitor and on IV fluids.  OBJECTIVE:  VITAL SIGNS:  Temp is 97.9, pulse 79, respiratory 20, blood pressure 117/72.  Heart rate was around 100 this morning. HEART:  An irregular irregularity.  Again rate of about 100. LUNGS:  Her lungs were clear throughout. SKIN:  Her color is good. EXTREMITIES:  There is no edema of the ankles.  LABORATORY DATA:  Today's white cell count is 5900, hemoglobin 9.  Her BMP was essentially normal except for glucose of 154.  Urine analysis was positive for nitrite, had many bacteria, and 0-2 WBCs per HPF.  ASSESSMENT: 1. Atrial fibrillation rapid ventricular response. 2. Symptomatic anemia, probably iron deficiency. 3. Malaise and fatigue. 4. Long-term anticoagulation on warfarin. 5. Type 2 diabetes. 6. Morbid obesity, much improved.  PLAN:  I am going to transfuse her a unit of packed cells today. Recheck a CBC tomorrow.  Hopefully this will increase her energy level, lower heart rates and we can discharge her tomorrow.  Apparently GI will get her EGD and colonoscopy done outpatient.  Also reviewed her urine which was slightly abnormal and urine C and S is pending and I will start her on some antibiotics while we are awaiting those results.  Note the ferritin was 9, iron level of 20, iron saturation 6%.  INR 2.23 today.  The anemia is iron deficiency anemia.    [name], M.D.    SDK/MEDQ  D:  09/03/2011  T:  09/03/2011  Job:  [id number]

## 2011-09-03 NOTE — Progress Notes (Signed)
ANTICOAGULATION CONSULT NOTE   Pharmacy Consult for Warfarin Indication: atrial fibrillation (continuation of home therapy)  Allergies  Allergen Reactions  . Codeine     REACTION: HALLUCINATIONS    Patient Measurements: Height: 5\' 10"  (177.8 cm) Weight: 242 lb 1 oz (109.8 kg) IBW/kg (Calculated) : 68.5   Vital Signs: Temp: 97.9 F (36.6 C) (03/02 0445) Temp src: Oral (03/02 0445) BP: 117/72 mmHg (03/02 0445) Pulse Rate: 79  (03/02 0445)  Labs:  Basename 09/03/11 0709 09/02/11 0446 09/01/11 1813  HGB 9.0* -- 9.1*  HCT 29.5* -- 30.0*  PLT 281 -- 314  APTT -- -- --  LABPROT 25.1* 25.0* 24.1*  INR 2.23* 2.22* 2.12*  HEPARINUNFRC -- -- --  CREATININE 0.58 -- 0.56  CKTOTAL -- -- --  CKMB -- -- --  TROPONINI -- -- --   Estimated Creatinine Clearance: 97.8 ml/min (by C-G formula based on Cr of 0.58).  Medical History: Past Medical History  Diagnosis Date  . Chest pain     Negative cardiac catheterization in 2002; negative stress nuclear study in 2008  . Congestive heart failure     Normal LV systolic function  . PAF (paroxysmal atrial fibrillation) 2009  . Chronic anticoagulation   . Diabetes mellitus, type 2     Insulin therapy; exacerbated by prednisone  . Syncope     Admitted 05/2009; magnetic resonance imagin/ MRA - negative; etiology thought to be orthostasis secondary to drugs and dehydration  . GERD (gastroesophageal reflux disease)   . IBS (irritable bowel syndrome)   . Hiatal hernia   . Gastroparesis     99% retention 05/2008 on GES  . Hyperlipidemia   . Hypertension   . Hypothyroid   . Chronic LBP     Surgical intervention in 1996  . Obesity   . OSA on CPAP   . Anemia     H/H of 10/30 with a normal MCV in 12/09  . Barrett's esophagus     Diagnosed 1995. Last EGD 2008.     Medications:  Prescriptions prior to admission  Medication Sig Dispense Refill  . citalopram (CELEXA) 20 MG tablet Take 20 mg by mouth daily.        Marland Kitchen glipiZIDE  (GLUCOTROL) 10 MG tablet Take 10 mg by mouth 2 (two) times daily before a meal.       . insulin glargine (LANTUS) 100 UNIT/ML injection Inject 50 Units into the skin at bedtime.       Marland Kitchen levothyroxine (SYNTHROID, LEVOTHROID) 125 MCG tablet Take 125 mcg by mouth daily.        Marland Kitchen LORazepam (ATIVAN) 1 MG tablet Take 1 mg by mouth 3 (three) times daily.        . metaxalone (SKELAXIN) 800 MG tablet Take 800 mg by mouth 3 (three) times daily.        . metFORMIN (GLUCOPHAGE) 1000 MG tablet Take 1,000 mg by mouth 2 (two) times daily.        Marland Kitchen omeprazole (PRILOSEC) 20 MG capsule Take 20 mg by mouth 2 (two) times daily.        . potassium chloride SA (K-DUR,KLOR-CON) 20 MEQ tablet Take 20 mEq by mouth daily.       . simvastatin (ZOCOR) 40 MG tablet Take 40 mg by mouth at bedtime.        Marland Kitchen warfarin (COUMADIN) 5 MG tablet Take 2.5 mg by mouth daily. At 6pm (1800)      . nitroGLYCERIN (NITROSTAT) 0.4 MG SL  tablet Place 0.4 mg under the tongue as needed.          Assessment: Continuation of home therapy. INR within desired target. No bleeding complications noted.    Goal of Therapy:  INR 2-3   Plan:  Warfarin 2.5 mg Daily (already ordered). Daily PT/INR (already ordered).  Tomi Bamberger J 09/03/2011,8:29 AM

## 2011-09-03 NOTE — Assessment & Plan Note (Signed)
No recent chest discomfort.

## 2011-09-03 NOTE — Progress Notes (Signed)
Notified Dr. [name] of the patients' HR staying in the early 100's.  Also the patient had a positive U/A and voiced that the patient currently was not on antibiotics.  New orders given and followed.

## 2011-09-04 ENCOUNTER — Encounter: Payer: Self-pay | Admitting: Internal Medicine

## 2011-09-04 LAB — CBC
HCT: 31.5 % — ABNORMAL LOW (ref 36.0–46.0)
MCHC: 31.1 g/dL (ref 30.0–36.0)
MCV: 77.2 fL — ABNORMAL LOW (ref 78.0–100.0)
Platelets: 284 10*3/uL (ref 150–400)
RDW: 17.7 % — ABNORMAL HIGH (ref 11.5–15.5)
WBC: 7.1 10*3/uL (ref 4.0–10.5)

## 2011-09-04 LAB — GLUCOSE, CAPILLARY: Glucose-Capillary: 86 mg/dL (ref 70–99)

## 2011-09-04 LAB — PROTIME-INR: INR: 2 — ABNORMAL HIGH (ref 0.00–1.49)

## 2011-09-04 MED ORDER — CIPROFLOXACIN HCL 500 MG PO TABS: 500.0000 mg | ORAL_TABLET | Freq: Two times a day (BID) | ORAL | Status: DC

## 2011-09-04 NOTE — Progress Notes (Signed)
Pt discharged home with instructions she verbalized understanding, and all questions were answered.  The patient left the floor via w/c in stable condition.

## 2011-09-04 NOTE — Progress Notes (Signed)
ANTICOAGULATION CONSULT NOTE   Pharmacy Consult for Warfarin Indication: atrial fibrillation (continuation of home therapy)  Allergies  Allergen Reactions  . Codeine     REACTION: HALLUCINATIONS    Patient Measurements: Height: 5\' 10"  (177.8 cm) Weight: 242 lb 1 oz (109.8 kg) IBW/kg (Calculated) : 68.5   Vital Signs: Temp: 97.6 F (36.4 C) (03/03 0518) Temp src: Oral (03/03 0518) BP: 117/88 mmHg (03/03 0518) Pulse Rate: 86  (03/03 0518)  Labs:  Basename 09/04/11 0334 09/03/11 0709 09/02/11 0446 09/01/11 1813  HGB 9.8* 9.0* -- --  HCT 31.5* 29.5* -- 30.0*  PLT 284 281 -- 314  APTT -- -- -- --  LABPROT 23.0* 25.1* 25.0* --  INR 2.00* 2.23* 2.22* --  HEPARINUNFRC -- -- -- --  CREATININE -- 0.58 -- 0.56  CKTOTAL -- -- -- --  CKMB -- -- -- --  TROPONINI -- -- -- --   Estimated Creatinine Clearance: 97.8 ml/min (by C-G formula based on Cr of 0.58).  Medical History: Past Medical History  Diagnosis Date  . Chest pain     Negative cardiac catheterization in 2002; negative stress nuclear study in 2008  . Congestive heart failure     Normal LV systolic function  . PAF (paroxysmal atrial fibrillation) 2009  . Chronic anticoagulation   . Diabetes mellitus, type 2     Insulin therapy; exacerbated by prednisone  . Syncope     Admitted 05/2009; magnetic resonance imagin/ MRA - negative; etiology thought to be orthostasis secondary to drugs and dehydration  . GERD (gastroesophageal reflux disease)   . IBS (irritable bowel syndrome)   . Hiatal hernia   . Gastroparesis     99% retention 05/2008 on GES  . Hyperlipidemia   . Hypertension   . Hypothyroid   . Chronic LBP     Surgical intervention in 1996  . Obesity   . OSA on CPAP   . Anemia     H/H of 10/30 with a normal MCV in 12/09  . Barrett's esophagus     Diagnosed 1995. Last EGD 2008.     Medications:  Prescriptions prior to admission  Medication Sig Dispense Refill  . citalopram (CELEXA) 20 MG tablet Take  20 mg by mouth daily.        Marland Kitchen glipiZIDE (GLUCOTROL) 10 MG tablet Take 10 mg by mouth 2 (two) times daily before a meal.       . insulin glargine (LANTUS) 100 UNIT/ML injection Inject 50 Units into the skin at bedtime.       Marland Kitchen levothyroxine (SYNTHROID, LEVOTHROID) 125 MCG tablet Take 125 mcg by mouth daily.        Marland Kitchen LORazepam (ATIVAN) 1 MG tablet Take 1 mg by mouth 3 (three) times daily.        . metaxalone (SKELAXIN) 800 MG tablet Take 800 mg by mouth 3 (three) times daily.        . metFORMIN (GLUCOPHAGE) 1000 MG tablet Take 1,000 mg by mouth 2 (two) times daily.        Marland Kitchen omeprazole (PRILOSEC) 20 MG capsule Take 20 mg by mouth 2 (two) times daily.        . potassium chloride SA (K-DUR,KLOR-CON) 20 MEQ tablet Take 20 mEq by mouth daily.       . simvastatin (ZOCOR) 40 MG tablet Take 40 mg by mouth at bedtime.        Marland Kitchen warfarin (COUMADIN) 5 MG tablet Take 2.5 mg by mouth daily. At  6pm (1800)      . nitroGLYCERIN (NITROSTAT) 0.4 MG SL tablet Place 0.4 mg under the tongue as needed.          Assessment: Continuation of home therapy. INR within desired target. No bleeding complications noted.    Goal of Therapy:  INR 2-3   Plan:  Warfarin 2.5 mg Daily (already ordered). Daily PT/INR (already ordered).  Gilman Buttner, [place] J 09/04/2011,11:17 AM

## 2011-09-04 NOTE — Discharge Summary (Signed)
NAMELYNNEL, ZANETTI                 ACCOUNT NO.:  [id number]  MEDICAL RECORD NO.:  [id number]  LOCATION:  A334                          FACILITY:  [facility]  PHYSICIAN:  [name], M.D.DATE OF BIRTH:  05/11/1951  DATE OF ADMISSION:  09/01/2011 DATE OF DISCHARGE:  LH                              DISCHARGE SUMMARY   This 61 year old was admitted to the hospital with weakness and atrial fibrillation, rapid ventricular response.  She had a benign 4 day hospitalization,  extending from the February 28th through September 04, 2011.  Her vital signs eventually stabilized.  Her admission white cell count 6400, hemoglobin 9.1, and MCV is 77.5. INR was 2.12.  TSH 0.446, proBNP 1667, and CMP normal except for the glucose of 163.  Her chest x-ray showed vascular congestion and mild cardiomegaly with mildly increased interstitial markings.  Her 12 lead EKG showed atrial fibrillation with rapid ventricular response, rate 101.  There is a possible lateral infarct, also noted 2 years ago.  She was admitted to the hospital.  She was on the monitor.  She was put on atorvastatin 20 mg daily, citalopram 20 mg daily, diltiazem 60 mg t.i.d. for rate control, which was a new medication, glipizide 10 mg b.i.d., Lantus insulin 50 units at bedtime, levothyroxine 25 mcg daily, lorazepam 1 mg t.i.d., Skelaxin 800 mg t.i.d., pantoprazole 40 mg daily, KCl 20 mEq daily, warfarin 2.5 mg daily.  When her urine was found to be positive for nitrite and many bacteria, this was sent for culture.  She was started on Cipro 500 mg b.i.d. at that time.  The 3rd day, she was also given a unit of packed cells because she kept on having tachycardia and hypertension.  After having a unit of packed cells I discontinued 1 dose of diltiazem and with that her rate actually dropped and her blood pressure went up.  At that point, stopped the diltiazem totally and by the following morning she has done quite well with a good blood  pressure and a heart rate in the 90s, and really much improved.  She was seen by GI while in the hospital, due to her anemia.  She also had anemia profile which showed an iron level of 20, total iron binding capacity 355, saturation 6% and a ferritin of 9 with a folate of greater than 20 and a B12 of 326, recheck 294.  It was felt, based on this, she did have iron deficiency anemia and Hemoccult x3 were ordered but she did not have a bowel movement to start checking this.  At the time of discharge she is much improved.  She was discharged home on: 1. Citalopram 20 mg daily. 2. Glipizide 10 mg b.i.d. 3. Lantus insulin 50 units at bedtime. 4. Levothyroxine 125 mcg daily. 5. Lorazepam 1 mg t.i.d. 6. Skelaxin 800 mg b.i.d. 7. Metformin 1000 mg b.i.d. 8. Omeprazole 20 mg b.i.d. 9. Potassium chloride 20 mEq daily. 10.Simvastatin 40 mg at bedtime. 11.Warfarin 2.5 mg daily or as directed. 12.She is also to use nitroglycerin 0.4 mg p.r.n. 13.She is prescribed Cipro 500 mg b.i.d. for a 10-day course.  She is to stop by my office  tomorrow and get Hemoccult cards and to call the office of Dr. Jena Gauss to schedule for an EGD and a colonoscopy.  Followup will be in my office in 10 days.  At that time, we will recheck her hemoglobin.  Once her cards are completed, she will be able to be started on iron sulfate 325 b.i.d.  Meanwhile, urine culture is pending.  FINAL DISCHARGE DIAGNOSES: 1. Atrial fibrillation with rapid ventricular response. 2. Iron deficiency anemia. 3. Urinary tract infection. 4. Malaise and fatigue of multifactorial etiology, including anemia     and atrial fibrillation. 5. Long-term anticoagulation on warfarin. 6. Type 2 diabetes, controlled. 7. Obesity. 8. History of recent significant weight loss.  Intentionally done by     the patient with decreased portion size. 9. Benign essential hypertension. 10.Hypercholesterolemia. 11.Obstructive sleep apnea.     [name], M.D.     SDK/MEDQ  D:  09/04/2011  T:  09/04/2011  Job:  [id number]

## 2011-09-04 NOTE — Progress Notes (Signed)
Patient was discharged from hospital today. She needs an appointment to see Tana Coast in the office in about 10 days to 2 weeks from now to proceed with outpatient GI evaluation.

## 2011-09-05 ENCOUNTER — Telehealth: Payer: Self-pay | Admitting: General Practice

## 2011-09-05 LAB — TYPE AND SCREEN: Unit division: 0

## 2011-09-05 NOTE — Telephone Encounter (Signed)
I called the patient to change her appt on 3/18 from 11:30am to 11:00am. I left her a message on her cell phone with this change

## 2011-09-06 LAB — URINE CULTURE
Colony Count: >=100000
Culture  Setup Time: 201303022004

## 2011-09-13 NOTE — Progress Notes (Signed)
Pt is scheduled to see KJ on 03/18

## 2011-09-14 ENCOUNTER — Encounter: Payer: Self-pay | Admitting: Physician Assistant

## 2011-09-14 ENCOUNTER — Other Ambulatory Visit: Payer: Self-pay

## 2011-09-14 ENCOUNTER — Emergency Department (HOSPITAL_COMMUNITY): Payer: Medicare Other

## 2011-09-14 ENCOUNTER — Ambulatory Visit (INDEPENDENT_AMBULATORY_CARE_PROVIDER_SITE_OTHER): Payer: Medicare Other | Admitting: Physician Assistant

## 2011-09-14 ENCOUNTER — Ambulatory Visit (INDEPENDENT_AMBULATORY_CARE_PROVIDER_SITE_OTHER): Payer: Medicare Other | Admitting: *Deleted

## 2011-09-14 ENCOUNTER — Emergency Department (HOSPITAL_COMMUNITY)
Admission: EM | Admit: 2011-09-14 | Discharge: 2011-09-14 | Disposition: A | Discharge status: Home / Self Care | Payer: Medicare Other | Attending: Emergency Medicine | Admitting: Emergency Medicine

## 2011-09-14 ENCOUNTER — Other Ambulatory Visit: Payer: Self-pay | Admitting: Physician Assistant

## 2011-09-14 ENCOUNTER — Encounter (HOSPITAL_COMMUNITY): Payer: Self-pay | Admitting: *Deleted

## 2011-09-14 VITALS — BP 143/84 | HR 80 | Resp 18 | Ht 68.0 in | Wt 230.0 lb

## 2011-09-14 DIAGNOSIS — M545 Low back pain, unspecified: Secondary | ICD-10-CM | POA: Insufficient documentation

## 2011-09-14 DIAGNOSIS — Z7901 Long term (current) use of anticoagulants: Secondary | ICD-10-CM

## 2011-09-14 DIAGNOSIS — I10 Essential (primary) hypertension: Secondary | ICD-10-CM | POA: Insufficient documentation

## 2011-09-14 DIAGNOSIS — R002 Palpitations: Secondary | ICD-10-CM | POA: Insufficient documentation

## 2011-09-14 DIAGNOSIS — Z794 Long term (current) use of insulin: Secondary | ICD-10-CM | POA: Insufficient documentation

## 2011-09-14 DIAGNOSIS — Z79899 Other long term (current) drug therapy: Secondary | ICD-10-CM | POA: Insufficient documentation

## 2011-09-14 DIAGNOSIS — I4891 Unspecified atrial fibrillation: Secondary | ICD-10-CM

## 2011-09-14 DIAGNOSIS — G8929 Other chronic pain: Secondary | ICD-10-CM | POA: Insufficient documentation

## 2011-09-14 DIAGNOSIS — E119 Type 2 diabetes mellitus without complications: Secondary | ICD-10-CM | POA: Insufficient documentation

## 2011-09-14 DIAGNOSIS — E785 Hyperlipidemia, unspecified: Secondary | ICD-10-CM | POA: Insufficient documentation

## 2011-09-14 DIAGNOSIS — D509 Iron deficiency anemia, unspecified: Secondary | ICD-10-CM

## 2011-09-14 DIAGNOSIS — E039 Hypothyroidism, unspecified: Secondary | ICD-10-CM | POA: Insufficient documentation

## 2011-09-14 DIAGNOSIS — I509 Heart failure, unspecified: Secondary | ICD-10-CM | POA: Insufficient documentation

## 2011-09-14 DIAGNOSIS — R000 Tachycardia, unspecified: Secondary | ICD-10-CM | POA: Insufficient documentation

## 2011-09-14 LAB — CBC
HCT: 33.8 % — ABNORMAL LOW (ref 36.0–46.0)
Hemoglobin: 10.4 g/dL — ABNORMAL LOW (ref 12.0–15.0)
MCH: 23.4 pg — ABNORMAL LOW (ref 26.0–34.0)
MCHC: 30.8 g/dL (ref 30.0–36.0)
MCV: 76.1 fL — ABNORMAL LOW (ref 78.0–100.0)

## 2011-09-14 LAB — DIFFERENTIAL
Basophils Absolute: 0 10*3/uL (ref 0.0–0.1)
Basophils Relative: 1 % (ref 0–1)
Eosinophils Absolute: 0.1 10*3/uL (ref 0.0–0.7)
Eosinophils Relative: 1 % (ref 0–5)
Monocytes Absolute: 0.8 10*3/uL (ref 0.1–1.0)
Neutro Abs: 3.9 10*3/uL (ref 1.7–7.7)

## 2011-09-14 LAB — TROPONIN I: Troponin I: <0.3 ng/mL (ref <0.30)

## 2011-09-14 LAB — BASIC METABOLIC PANEL
BUN: 15 mg/dL (ref 6–23)
Calcium: 9.7 mg/dL (ref 8.4–10.5)
Creatinine, Ser: 1.55 mg/dL — ABNORMAL HIGH (ref 0.50–1.10)
GFR calc Af Amer: 40 mL/min — ABNORMAL LOW (ref >90)

## 2011-09-14 MED ORDER — DILTIAZEM HCL 30 MG PO TABS
60.0000 mg | ORAL_TABLET | Freq: Once | ORAL | Status: AC
  Administered 2011-09-14: 60 mg via ORAL
  Filled 2011-09-14: qty 3

## 2011-09-14 MED ORDER — DILTIAZEM HCL 60 MG PO TABS: 60.0000 mg | ORAL_TABLET | Freq: Three times a day (TID) | ORAL | Status: DC

## 2011-09-14 NOTE — Progress Notes (Signed)
HPI:  This is a 61 year old white female patient who has a history of paroxysmal atrial fibrillation, orthostatic hypotension, syncope, hypertension, and diabetes mellitus.  The patient was recently admitted to the hospital on 09/01/11 with rapid atrial fibrillation. She was started on Cardizem 60 mg t.i.d. But it was stopped prior to discharge because her heart rate dropped and blood pressure went up according to discharge summary. She was also anemic and received one unit of packed cells. She is scheduled to see GI this week.  The patient has done well at home. She's had occasional palpitations but they have been short-lived. When she first came into the office today her heart rate was 80. When I was listening to her her heart rate was 135. She did begin to feel the palpitations. She denies any chest pain, dyspnea, dyspnea on exertion, dizziness, or presyncope.  Allergies  Allergen Reactions  . Codeine     REACTION: HALLUCINATIONS    Current Outpatient Prescriptions on File Prior to Visit  Medication Sig Dispense Refill  . citalopram (CELEXA) 20 MG tablet Take 20 mg by mouth daily.        Marland Kitchen glipiZIDE (GLUCOTROL) 10 MG tablet Take 10 mg by mouth 2 (two) times daily before a meal.       . insulin glargine (LANTUS) 100 UNIT/ML injection Inject 50 Units into the skin at bedtime.       Marland Kitchen levothyroxine (SYNTHROID, LEVOTHROID) 125 MCG tablet Take 125 mcg by mouth daily.        Marland Kitchen LORazepam (ATIVAN) 1 MG tablet Take 1 mg by mouth 3 (three) times daily.        . metaxalone (SKELAXIN) 800 MG tablet Take 800 mg by mouth 3 (three) times daily.        . metFORMIN (GLUCOPHAGE) 1000 MG tablet Take 1,000 mg by mouth 2 (two) times daily.        . nitroGLYCERIN (NITROSTAT) 0.4 MG SL tablet Place 0.4 mg under the tongue as needed.        Marland Kitchen omeprazole (PRILOSEC) 20 MG capsule Take 20 mg by mouth 2 (two) times daily.        . potassium chloride SA (K-DUR,KLOR-CON) 20 MEQ tablet Take 20 mEq by mouth daily.         . simvastatin (ZOCOR) 40 MG tablet Take 40 mg by mouth at bedtime.        Marland Kitchen warfarin (COUMADIN) 5 MG tablet Take 2.5 mg by mouth daily. At 6pm (1800)        Past Medical History  Diagnosis Date  . Chest pain     Negative cardiac catheterization in 2002; negative stress nuclear study in 2008  . Congestive heart failure     Normal LV systolic function  . PAF (paroxysmal atrial fibrillation) 2009  . Chronic anticoagulation   . Diabetes mellitus, type 2     Insulin therapy; exacerbated by prednisone  . Syncope     Admitted 05/2009; magnetic resonance imagin/ MRA - negative; etiology thought to be orthostasis secondary to drugs and dehydration  . GERD (gastroesophageal reflux disease)   . IBS (irritable bowel syndrome)   . Hiatal hernia   . Gastroparesis     99% retention 05/2008 on GES  . Hyperlipidemia   . Hypertension   . Hypothyroid   . Chronic LBP     Surgical intervention in 1996  . Obesity   . OSA on CPAP   . Anemia     H/H of  10/30 with a normal MCV in 12/09  . Barrett's esophagus     Diagnosed 1995. Last EGD 2008.     Past Surgical History  Procedure Date  . Cardiac catheterization 2002  . Cardiovascular stress test 2008    Stress nuclear study  . Back surgery 1996  . Laparoscopic cholecystectomy 1990s  . Total abdominal hysterectomy 1999  . Laminectomy 1995    L4-L5  . Carpal tunnel release 1994  . Esophagogastroduodenoscopy 2008    Barrett's without dysplasia. esphagus dilated. antral erosions, h.pylori serologies negative.  . Colonoscopy     Patient reports normal TCS around 10 years ago.    Family History  Problem Relation Age of Onset  . Hypertension Mother   . Alzheimer's disease Mother   . Stroke Mother   . Heart attack Mother   . Heart disease Neg Hx   . Hypertension Other   . Colon cancer Neg Hx     History   Social History  . Marital Status: Married    Spouse Name: N/A    Number of Children: N/A  . Years of Education: N/A    Occupational History  . 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     Social History Main Topics  . Smoking status: Former Games developer  . Smokeless tobacco: Former Neurosurgeon   Comment: Remote  . Alcohol Use: No     last etoh one year ago, never frequent  . Drug Use: No  . Sexually Active: Yes    Birth Control/ Protection: None   Other Topics Concern  . Not on file   Social History Narrative   Sedentary4 children, "blended family"    ROS:see history of present illness otherwise negative   PHYSICAL EXAM: Well-nournished, in no acute distress. Neck: No JVD, HJR, Bruit, or thyroid enlargement Lungs: No tachypnea, clear without wheezing, rales, or rhonchi Cardiovascular: irregular irregular at a rapid rate, 2/6 systolic murmur at the left sternal border, no gallops, bruit, thrill, or heave. Abdomen: BS normal. Soft without organomegaly, masses, lesions or tenderness. Extremities: trace of edema on the right ankle, otherwise lower extremities without cyanosis, clubbing . Good distal pulses bilateral SKin: Warm, no lesions or rashes  Musculoskeletal: No deformities Neuro: no focal signs  BP 143/84  Pulse 80  Resp 18  Ht 5\' 8"  (1.727 m)  Wt 230 lb (104.327 kg)  BMI 34.97 kg/m2   ZOX:WRUEAV fibrillation at 135 beats per minute nonspecific ST-T wave changes, PVC

## 2011-09-14 NOTE — ED Notes (Signed)
Pt c/o weakness. Pt states that she was in the hospital 2 weeks ago for her A fib and has been having palpitations off and on. Pt alert and oriented x 3. Skin warm and dry. Color pink. Breath sounds clear and equal bilaterally. Pt placed on cardiac monitor showing A fib with rate of 112- 136. Pt denies pain or shortness of breath.

## 2011-09-14 NOTE — ED Notes (Signed)
Dr. Effie Shy aware of patient c/o "pressure" - had discussed this with patient earlier.  He will re-evaluate her.

## 2011-09-14 NOTE — ED Notes (Signed)
Cardiac monitor showing A fib with rate 107. Pt denies pain at this time. Family at bedside.

## 2011-09-14 NOTE — Assessment & Plan Note (Signed)
INR checked today.

## 2011-09-14 NOTE — ED Notes (Signed)
Ambulated with minimal assistance - tolerated well.

## 2011-09-14 NOTE — Assessment & Plan Note (Signed)
Patient had a recent admission with rapid atrial fibrillation treated with Cardizem initially. This was stopped because of lower heart rates. Her heart rate is up to 135 beats per minute now in the office today. I will restart Cardizem 60 mg t.i.d. We will also have her wear her a 24-hour Holter monitor. I will check a CBC and bmet today.

## 2011-09-14 NOTE — Assessment & Plan Note (Signed)
Patient was transfused one unit of packed red blood cells in the hospital. She is scheduled to see GI this week. I will check a CBC today.

## 2011-09-14 NOTE — Discharge Instructions (Signed)
Followup tomorrow morning for the Holter monitor placement as scheduled. Take her Cardizem tonight before bedtime. Drink plenty of fluids. Try to eat 3 meals a day. See Dr. [name], in one week for a repeat creatinine test. It appears that your creatinine, is elevated because of the Cipro you took recently. It should come down in one week. It would be best to avoid taking Cipro in the future.  Palpitations  A palpitation is the feeling that your heartbeat is irregular or is faster than normal. Although this is frightening, it usually is not serious. Palpitations may be caused by excesses of smoking, caffeine, or alcohol. They are also brought on by stress and anxiety. Sometimes, they are caused by heart disease. Unless otherwise noted, your caregiver did not find any signs of serious illness at this time. HOME CARE INSTRUCTIONS  To help prevent palpitations:  Drink decaffeinated coffee, tea, and soda pop. Avoid chocolate.   If you smoke or drink alcohol, quit or cut down as much as possible.   Reduce your stress or anxiety level. Biofeedback, yoga, or meditation will help you relax. Physical activity such as swimming, jogging, or walking also may be helpful.  SEEK MEDICAL CARE IF:   You continue to have a fast heartbeat.   Your palpitations occur more often.  SEEK IMMEDIATE MEDICAL CARE IF: You develop chest pain, shortness of breath, severe headache, dizziness, or fainting. Document Released: 06/17/2000 Document Revised: 06/09/2011 Document Reviewed: 08/17/2007 [facility] Patient Information 2012 [place], [place].

## 2011-09-14 NOTE — ED Notes (Signed)
Heavy sensation with palpitations , nausea this am, vomited x1

## 2011-09-14 NOTE — ED Provider Notes (Addendum)
History     CSN: [id number]  Arrival date & time 09/14/11  1636   First MD Initiated Contact with Patient 09/14/11 1655      Chief Complaint  Patient presents with  . Palpitations    (Consider location/radiation/quality/duration/timing/severity/associated sxs/prior treatment) HPI Comments: [name] is a 61 y.o. female who was at her doctor's office today for a INR check, was discovered to have, tachycardia, was given a prescription for Cardizem 60 mg 3 times a day, which she has not started yet. She went home to await for the prescription delivery. While waiting she noted palpitations and a pressure sensation in her center chest. The palpitations, and pressure occurred together. In the emergency department; she has no pressure sensation and she has occasional palpitations. She had been well prior to see her doctor. She has been eating, not having shortness of breath, not having weakness, not having headache or paresthesias. She was hospitalized 2 weeks ago for weakness and was found to have anemia, source unknown. She received one unit of packed red cells. She was on Cardizem, but was stopped when her heart rate, and blood pressure improved. She is due to have a Holter monitor placed tomorrow. She denies known rectal bleeding.  Patient is a 61 y.o. female presenting with palpitations.  Palpitations     Past Medical History  Diagnosis Date  . Chest pain     Negative cardiac catheterization in 2002; negative stress nuclear study in 2008  . Congestive heart failure     Normal LV systolic function  . PAF (paroxysmal atrial fibrillation) 2009  . Chronic anticoagulation   . Diabetes mellitus, type 2     Insulin therapy; exacerbated by prednisone  . Syncope     Admitted 05/2009; magnetic resonance imagin/ MRA - negative; etiology thought to be orthostasis secondary to drugs and dehydration  . GERD (gastroesophageal reflux disease)   . IBS (irritable bowel syndrome)   . Hiatal hernia    . Gastroparesis     99% retention 05/2008 on GES  . Hyperlipidemia   . Hypertension   . Hypothyroid   . Chronic LBP     Surgical intervention in 1996  . Obesity   . OSA on CPAP   . Anemia     H/H of 10/30 with a normal MCV in 12/09  . Barrett's esophagus     Diagnosed 1995. Last EGD 2008.     Past Surgical History  Procedure Date  . Cardiac catheterization 2002  . Cardiovascular stress test 2008    Stress nuclear study  . Back surgery 1996  . Laparoscopic cholecystectomy 1990s  . Total abdominal hysterectomy 1999  . Laminectomy 1995    L4-L5  . Carpal tunnel release 1994  . Esophagogastroduodenoscopy 2008    Barrett's without dysplasia. esphagus dilated. antral erosions, h.pylori serologies negative.  . Colonoscopy     Patient reports normal TCS around 10 years ago.    Family History  Problem Relation Age of Onset  . Hypertension Mother   . Alzheimer's disease Mother   . Stroke Mother   . Heart attack Mother   . Heart disease Neg Hx   . Hypertension Other   . Colon cancer Neg Hx     History  Substance Use Topics  . Smoking status: Former Games developer  . Smokeless tobacco: Former Neurosurgeon   Comment: Remote  . Alcohol Use: No     last etoh one year ago, never frequent    OB History  Grav Para Term Preterm Abortions TAB SAB Ect Mult Living                  Review of Systems  Cardiovascular: Positive for palpitations.  All other systems reviewed and are negative.    Allergies  Codeine  Home Medications   Current Outpatient Rx  Name Route Sig Dispense Refill  . CITALOPRAM HYDROBROMIDE 20 MG PO TABS Oral Take 20 mg by mouth daily.      Marland Kitchen GLIPIZIDE 10 MG PO TABS Oral Take 10 mg by mouth 2 (two) times daily before a meal.     . INSULIN GLARGINE 100 UNIT/ML [place] SOLN Subcutaneous Inject 50 Units into the skin at bedtime.     Marland Kitchen LEVOTHYROXINE SODIUM 125 MCG PO TABS Oral Take 125 mcg by mouth daily.      Marland Kitchen LORAZEPAM 1 MG PO TABS Oral Take 1 mg by mouth 3  (three) times daily.      Marland Kitchen METAXALONE 800 MG PO TABS Oral Take 800 mg by mouth 3 (three) times daily.      Marland Kitchen METFORMIN HCL 1000 MG PO TABS Oral Take 1,000 mg by mouth 2 (two) times daily.      Marland Kitchen OMEPRAZOLE 20 MG PO CPDR Oral Take 20 mg by mouth 2 (two) times daily.      Marland Kitchen POTASSIUM CHLORIDE CRYS ER 20 MEQ PO TBCR Oral Take 20 mEq by mouth daily.     Marland Kitchen SIMVASTATIN 40 MG PO TABS Oral Take 40 mg by mouth at bedtime.      . WARFARIN SODIUM 5 MG PO TABS Oral Take 2.5 mg by mouth daily. At 6pm (1800)    . DILTIAZEM HCL 60 MG PO TABS Oral Take 1 tablet (60 mg total) by mouth 3 (three) times daily. 90 tablet 0  . NITROGLYCERIN 0.4 MG SL SUBL Sublingual Place 0.4 mg under the tongue as needed.        BP 115/56  Pulse 131  Temp(Src) 98.6 F (37 C) (Oral)  Resp 20  Ht 5\' 8"  (1.727 m)  Wt 230 lb (104.327 kg)  BMI 34.97 kg/m2  SpO2 96%  Physical Exam  Nursing note and vitals reviewed. Constitutional: She is oriented to person, place, and time. She appears well-developed and well-nourished.  HENT:  Head: Normocephalic and atraumatic.  Eyes: Conjunctivae and EOM are normal. Pupils are equal, round, and reactive to light.  Neck: Normal range of motion and phonation normal. Neck supple.  Cardiovascular: Intact distal pulses.        Tachycardic irregular rhythm  Pulmonary/Chest: Effort normal and breath sounds normal. She exhibits no tenderness.  Abdominal: Soft. She exhibits no distension. There is no tenderness. There is no guarding.  Musculoskeletal: Normal range of motion.  Neurological: She is alert and oriented to person, place, and time. She has normal strength. She exhibits normal muscle tone.  Skin: Skin is warm and dry.  Psychiatric: She has a normal mood and affect. Her behavior is normal. Judgment and thought content normal.    ED Course  Procedures (including critical care time)  Treated with oral Cardizem in ED.  Her pharmacy delivered her Cardizem prescription here   Date:  09/14/2011  Rate: 128  Rhythm: atrial fibrillation  QRS Axis: left  Intervals: QT normal  ST/T Wave abnormalities: nonspecific T wave changes  Conduction Disutrbances:none  Narrative Interpretation: rate faster today  Old EKG Reviewed: changes noted   Labs Reviewed  CBC - Abnormal; Notable for the  following:    Hemoglobin 10.4 (*)    HCT 33.8 (*)    MCV 76.1 (*)    MCH 23.4 (*)    RDW 17.8 (*)    All other components within normal limits  BASIC METABOLIC PANEL - Abnormal; Notable for the following:    Glucose, Bld 238 (*)    Creatinine, Ser 1.55 (*)    GFR calc non Af Amer 35 (*)    GFR calc Af Amer 40 (*)    All other components within normal limits  DIFFERENTIAL  TROPONIN I   Dg Chest Port 1 View  09/14/2011  *RADIOLOGY REPORT*  Clinical Data: Chest pain  PORTABLE CHEST - 1 VIEW  Comparison: 09/01/2011  Findings: The heart is moderately enlarged.  Pulmonary vascularity is within normal limits.  Lungs are grossly clear.  No pneumothorax.  Low volumes.  IMPRESSION: Cardiomegaly without edema.  Original Report Authenticated By: Donavan Burnet, M.D.   8:11 PM Reevaluation with update and discussion. She continues to have mild chest pressure, and now has reproducible chest wall tenderness diffusely on the anterior chest.  After initial assessment and treatment, an updated evaluation reveals her heart rate has trended down after taking oral Cardizem. An ambulation trial is done with a heart rate in the range of 115 to 123. When she Back to bed the heart rate was 131 and the blood pressure was 115/56. When, ambulating  she felt mildly lightheaded. She has felt that before with walks like this. She feels comfortable going home at this time. Valla Pacey L      1. Palpitation       MDM  Patient has improved, with oral medication in ED. . Doubt ACS, PE, pneumonia, metabolic instability. Patient stable for discharge. Creatinine, elevation, likely due to recent Cipro  treatment    Plan: Home Medications- Cardizem PO; Home Treatments- rest, increase fluids; Recommended follow up- See PCP 1 week. Get Holter monitor tomorrow as scheduled.    Flint Melter, MD 09/14/11 2017  Flint Melter, MD 09/14/11 2026

## 2011-09-14 NOTE — Patient Instructions (Signed)
**Note De-Identified  Obfuscation** Your physician has recommended that you wear a holter monitor. Holter monitors are medical devices that record the heart's electrical activity. Doctors most often use these monitors to diagnose arrhythmias. Arrhythmias are problems with the speed or rhythm of the heartbeat. The monitor is a small, portable device. You can wear one while you do your normal daily activities. This is usually used to diagnose what is causing palpitations/syncope (passing out).  Your physician recommends that you return for lab work in: today  Your physician recommends that you schedule a follow-up appointment in: 1 week

## 2011-09-15 ENCOUNTER — Ambulatory Visit (HOSPITAL_COMMUNITY)
Admission: RE | Admit: 2011-09-15 | Discharge: 2011-09-15 | Disposition: A | Discharge status: Home / Self Care | Payer: Medicare Other | Source: Ambulatory Visit | Attending: Physician Assistant | Admitting: Physician Assistant

## 2011-09-15 DIAGNOSIS — I4891 Unspecified atrial fibrillation: Secondary | ICD-10-CM

## 2011-09-15 DIAGNOSIS — Z7901 Long term (current) use of anticoagulants: Secondary | ICD-10-CM | POA: Insufficient documentation

## 2011-09-15 LAB — CBC WITH DIFFERENTIAL/PLATELET
Basophils Absolute: 0 10*3/uL (ref 0.0–0.1)
Basophils Relative: 0 % (ref 0–1)
Eosinophils Absolute: 0.1 10*3/uL (ref 0.0–0.7)
MCH: 23.6 pg — ABNORMAL LOW (ref 26.0–34.0)
MCHC: 29.8 g/dL — ABNORMAL LOW (ref 30.0–36.0)
Monocytes Absolute: 0.7 10*3/uL (ref 0.1–1.0)
Neutro Abs: 4.1 10*3/uL (ref 1.7–7.7)
Neutrophils Relative %: 54 % (ref 43–77)
RDW: 18.8 % — ABNORMAL HIGH (ref 11.5–15.5)

## 2011-09-15 LAB — BASIC METABOLIC PANEL
BUN: 14 mg/dL (ref 6–23)
Calcium: 9.4 mg/dL (ref 8.4–10.5)
Glucose, Bld: 190 mg/dL — ABNORMAL HIGH (ref 70–99)
Potassium: 3.7 mEq/L (ref 3.5–5.3)
Sodium: 141 mEq/L (ref 135–145)

## 2011-09-15 NOTE — Progress Notes (Signed)
**Note De-Identified Adylene Dlugosz Obfuscation** Addended by: Demetrios Loll on: 09/15/2011 11:57 AM   Modules accepted: Orders

## 2011-09-15 NOTE — Progress Notes (Signed)
Pt has OV on 3/18 at 11 with KJ

## 2011-09-15 NOTE — Progress Notes (Signed)
*  PRELIMINARY RESULTS* Echocardiogram 24H Holter monitor has been performed.  Conrad [place] 09/15/2011, 8:39 AM

## 2011-09-19 ENCOUNTER — Encounter: Payer: Self-pay | Admitting: Urgent Care

## 2011-09-19 ENCOUNTER — Ambulatory Visit (INDEPENDENT_AMBULATORY_CARE_PROVIDER_SITE_OTHER): Payer: Medicare Other | Admitting: Urgent Care

## 2011-09-19 ENCOUNTER — Other Ambulatory Visit: Payer: Self-pay

## 2011-09-19 ENCOUNTER — Ambulatory Visit: Payer: Medicare Other | Admitting: Gastroenterology

## 2011-09-19 VITALS — BP 109/72 | HR 109 | Temp 98.3°F | Ht 68.0 in | Wt 232.2 lb

## 2011-09-19 DIAGNOSIS — K219 Gastro-esophageal reflux disease without esophagitis: Secondary | ICD-10-CM

## 2011-09-19 DIAGNOSIS — K2270 Barrett's esophagus without dysplasia: Secondary | ICD-10-CM

## 2011-09-19 DIAGNOSIS — I10 Essential (primary) hypertension: Secondary | ICD-10-CM

## 2011-09-19 DIAGNOSIS — Z7901 Long term (current) use of anticoagulants: Secondary | ICD-10-CM

## 2011-09-19 DIAGNOSIS — R197 Diarrhea, unspecified: Secondary | ICD-10-CM

## 2011-09-19 DIAGNOSIS — K3184 Gastroparesis: Secondary | ICD-10-CM

## 2011-09-19 DIAGNOSIS — D509 Iron deficiency anemia, unspecified: Secondary | ICD-10-CM

## 2011-09-19 NOTE — Assessment & Plan Note (Signed)
Asymptomatic at this time 

## 2011-09-19 NOTE — Assessment & Plan Note (Signed)
She will need colonoscopy and EGD for further evaluation to rule out occult malignancy.  She has atrial fibrillation and is on chronic anti-coagulation. She has underwent recent Holter monitoring and is awaiting results. Once stable from a cardiac standpoint, would consider proceeding with colonoscopy and EGD. I have discussed risks & benefits which include, but are not limited to, bleeding, infection, perforation & drug reaction.  The patient agrees with this plan & written consent will be obtained.

## 2011-09-19 NOTE — Assessment & Plan Note (Signed)
Well-controlled on Prilosec 20 mg twice a day.

## 2011-09-19 NOTE — Progress Notes (Signed)
Hemoccults negative x3 per Dr. Michelle Nasuti office She needs a full set of stool studies Please call patient to arrange

## 2011-09-19 NOTE — Progress Notes (Signed)
Primary Care Physician:  [name], MD, MD Primary Gastroenterologist:  Dr. Darrick Penna  Chief Complaint  Patient presents with  . Follow-up    HPI:  [name] is a 61 y.o. female here for follow up for anemia.  Recent hospitalization for atrial fibrillation with RVR. She was seen in consultation with Dr. Jena Gauss and Tana Coast, [facility]. She was advised to have followup for consideration of outpatient colonoscopy and EGD. She had an iron of 11, ferritin 10, percent saturation 3. Normal B12. RBCs 3.83. She has continued complaints of Watery diarrhea 5 times per day for the last 3 months.  Most recent INR 3.5.  Creatinine elevated. Drinking fluids.  Cipro finished 5 days ago.  Takes imodium 2-3 per day prn.  Denies any upper GI symptoms including heartburn, indigestion, nausea, vomiting, dysphagia, odynophagia or anorexia.   Denies rectal bleeding, melena or weight loss.  Hx diabetic gastroparesis.  Stool studies Dr [name] negative per patient.  Deneis fever or chills.  Hemoccults pending from Dr Michelle Nasuti per pt.    Recent Results (from the past 168 hour(s))  POCT INR   Collection Time   09/14/11  2:10 PM      Component Value Range   INR 3.5    CBC WITH DIFFERENTIAL   Collection Time   09/14/11  2:35 PM      Component Value Range   WBC 7.6  4.0 - 10.5 (K/uL)   RBC 4.54  3.87 - 5.11 (MIL/uL)   Hemoglobin 10.7 (*) 12.0 - 15.0 (g/dL)   HCT 11.9 (*) 14.7 - 46.0 (%)   MCV 79.1  78.0 - 100.0 (fL)   MCH 23.6 (*) 26.0 - 34.0 (pg)   MCHC 29.8 (*) 30.0 - 36.0 (g/dL)   RDW 82.9 (*) 56.2 - 15.5 (%)   Platelets 291  150 - 400 (K/uL)   Neutrophils Relative 54  43 - 77 (%)   Neutro Abs 4.1  1.7 - 7.7 (K/uL)   Lymphocytes Relative 36  12 - 46 (%)   Lymphs Abs 2.7  0.7 - 4.0 (K/uL)   Monocytes Relative 9  3 - 12 (%)   Monocytes Absolute 0.7  0.1 - 1.0 (K/uL)   Eosinophils Relative 1  0 - 5 (%)   Eosinophils Absolute 0.1  0.0 - 0.7 (K/uL)   Basophils Relative 0  0 - 1 (%)   Basophils Absolute  0.0  0.0 - 0.1 (K/uL)   Smear Review Criteria for review not met    BASIC METABOLIC PANEL   Collection Time   09/14/11  2:35 PM      Component Value Range   Sodium 141  135 - 145 (mEq/L)   Potassium 3.7  3.5 - 5.3 (mEq/L)   Chloride 109  96 - 112 (mEq/L)   CO2 19  19 - 32 (mEq/L)   Glucose, Bld 190 (*) 70 - 99 (mg/dL)   BUN 14  6 - 23 (mg/dL)   Creat 1.30 (*) 8.65 - 1.10 (mg/dL)   Calcium 9.4  8.4 - 78.4 (mg/dL)  CBC   Collection Time   09/14/11  5:28 PM      Component Value Range   WBC 6.9  4.0 - 10.5 (K/uL)   RBC 4.44  3.87 - 5.11 (MIL/uL)   Hemoglobin 10.4 (*) 12.0 - 15.0 (g/dL)   HCT 69.6 (*) 29.5 - 46.0 (%)   MCV 76.1 (*) 78.0 - 100.0 (fL)   MCH 23.4 (*) 26.0 - 34.0 (pg)  MCHC 30.8  30.0 - 36.0 (g/dL)   RDW 96.0 (*) 45.4 - 15.5 (%)   Platelets 279  150 - 400 (K/uL)  DIFFERENTIAL   Collection Time   09/14/11  5:28 PM      Component Value Range   Neutrophils Relative 56  43 - 77 (%)   Neutro Abs 3.9  1.7 - 7.7 (K/uL)   Lymphocytes Relative 31  12 - 46 (%)   Lymphs Abs 2.1  0.7 - 4.0 (K/uL)   Monocytes Relative 11  3 - 12 (%)   Monocytes Absolute 0.8  0.1 - 1.0 (K/uL)   Eosinophils Relative 1  0 - 5 (%)   Eosinophils Absolute 0.1  0.0 - 0.7 (K/uL)   Basophils Relative 1  0 - 1 (%)   Basophils Absolute 0.0  0.0 - 0.1 (K/uL)  BASIC METABOLIC PANEL   Collection Time   09/14/11  5:28 PM      Component Value Range   Sodium 138  135 - 145 (mEq/L)   Potassium 3.5  3.5 - 5.1 (mEq/L)   Chloride 106  96 - 112 (mEq/L)   CO2 19  19 - 32 (mEq/L)   Glucose, Bld 238 (*) 70 - 99 (mg/dL)   BUN 15  6 - 23 (mg/dL)   Creatinine, Ser 0.98 (*) 0.50 - 1.10 (mg/dL)   Calcium 9.7  8.4 - 11.9 (mg/dL)   GFR calc non Af Amer 35 (*) >90 (mL/min)   GFR calc Af Amer 40 (*) >90 (mL/min)  TROPONIN I   Collection Time   09/14/11  5:28 PM      Component Value Range   Troponin I <0.30  <0.30 (ng/mL)   Past Medical History  Diagnosis Date  . Chest pain     Negative cardiac catheterization in  2002; negative stress nuclear study in 2008  . Congestive heart failure     Normal LV systolic function  . PAF (paroxysmal atrial fibrillation) 2009  . Chronic anticoagulation   . Diabetes mellitus, type 2     Insulin therapy; exacerbated by prednisone  . Syncope     Admitted 05/2009; magnetic resonance imagin/ MRA - negative; etiology thought to be orthostasis secondary to drugs and dehydration  . GERD (gastroesophageal reflux disease)   . IBS (irritable bowel syndrome)   . Hiatal hernia   . Gastroparesis     99% retention 05/2008 on GES  . Hyperlipidemia   . Hypertension   . Hypothyroid   . Chronic LBP     Surgical intervention in 1996  . Obesity   . OSA on CPAP   . Anemia     H/H of 10/30 with a normal MCV in 12/09  . Barrett's esophagus     Diagnosed 1995. Last EGD 2008.     Past Surgical History  Procedure Date  . Cardiac catheterization 2002  . Cardiovascular stress test 2008    Stress nuclear study  . Back surgery 1996  . Laparoscopic cholecystectomy 1990s  . Total abdominal hysterectomy 1999  . Laminectomy 1995    L4-L5  . Carpal tunnel release 1994  . Esophagogastroduodenoscopy 2008    Barrett's without dysplasia. esphagus dilated. antral erosions, h.pylori serologies negative.  . Colonoscopy     Patient reports normal TCS around 10 years ago.    Current Outpatient Prescriptions  Medication Sig Dispense Refill  . citalopram (CELEXA) 20 MG tablet Take 20 mg by mouth daily.        Marland Kitchen diltiazem (  CARDIZEM) 60 MG tablet Take 1 tablet (60 mg total) by mouth 3 (three) times daily.  90 tablet  0  . glipiZIDE (GLUCOTROL) 10 MG tablet Take 10 mg by mouth 2 (two) times daily before a meal.       . insulin glargine (LANTUS) 100 UNIT/ML injection Inject 50 Units into the skin at bedtime.       Marland Kitchen levothyroxine (SYNTHROID, LEVOTHROID) 125 MCG tablet Take 125 mcg by mouth daily.        Marland Kitchen LORazepam (ATIVAN) 1 MG tablet Take 1 mg by mouth 3 (three) times daily.        .  metaxalone (SKELAXIN) 800 MG tablet Take 800 mg by mouth 3 (three) times daily.        . metFORMIN (GLUCOPHAGE) 1000 MG tablet Take 1,000 mg by mouth 2 (two) times daily.        . nitroGLYCERIN (NITROSTAT) 0.4 MG SL tablet Place 0.4 mg under the tongue as needed.        Marland Kitchen omeprazole (PRILOSEC) 20 MG capsule Take 20 mg by mouth 2 (two) times daily.        . potassium chloride SA (K-DUR,KLOR-CON) 20 MEQ tablet Take 20 mEq by mouth daily.       . simvastatin (ZOCOR) 40 MG tablet Take 40 mg by mouth at bedtime.        Marland Kitchen warfarin (COUMADIN) 5 MG tablet Take 2.5 mg by mouth daily. At 6pm (1800)        Allergies as of 09/19/2011 - Review Complete 09/19/2011  Allergen Reaction Noted  . Codeine      Review of Systems: Gen: Denies any fever, chills, sweats, anorexia, fatigue, weakness, malaise, weight loss, and sleep disorder. CV: Denies chest pain, angina, palpitations, syncope, orthopnea, PND, peripheral edema, and claudication. Resp: Denies dyspnea at rest, dyspnea with exercise, cough, sputum, wheezing, coughing up blood, and pleurisy. GI: Denies vomiting blood, jaundice, and fecal incontinence. Derm: Denies rash, itching, dry skin, hives, moles, warts, or unhealing ulcers.  Psych: Denies depression, anxiety, memory loss, suicidal ideation, hallucinations, paranoia, and confusion. Heme: Denies bruising, bleeding, and enlarged lymph nodes.  Physical Exam: BP 109/72  Pulse 109  Temp(Src) 98.3 F (36.8 C) (Temporal)  Ht 5\' 8"  (1.727 m)  Wt 232 lb 3.2 oz (105.325 kg)  BMI 35.31 kg/m2 General:   Alert,  obese, pleasant and cooperative in NAD.  Accompany by her daughter today. Eyes:  Sclera clear, no icterus.   Conjunctiva pink. Mouth:  No deformity or lesions, oropharynx pink and moist. Neck:  Supple; no masses or thyromegaly. Heart: Irregularly irregular. Rate 100 apically. Chest: Lungs clear to Auscultation bilaterally. Abdomen: Protuberant. Normal bowel sounds.  No bruits.  Soft,  non-tender and non-distended without masses, hepatosplenomegaly or hernias noted.  No guarding or rebound tenderness.   Rectal:  Deferred. Msk:  Symmetrical without gross deformities.  Pulses:  Normal pulses noted. Extremities:  No edema. Neurologic:  Alert and oriented x4;  grossly normal neurologically. Skin:  Intact without significant lesions or rashes.

## 2011-09-19 NOTE — Progress Notes (Signed)
Addended by: Joselyn Arrow on: 09/19/2011 04:53 PM   Modules accepted: Orders

## 2011-09-19 NOTE — Assessment & Plan Note (Addendum)
Check C. difficile PCR Request stool studies from Dr. Michelle Nasuti office [place] fluids May take Imodium as directed once stool studies returned Begin ALIGN daily

## 2011-09-19 NOTE — Patient Instructions (Addendum)
Return her stool specimen to the lab as soon as possible Plenty of fluids Begin align one daily You may use Imodium as directed once stool studies all returned He will need colonoscopy and EGD with Dr.Fields.  We will be discussing management of your Coumadin with your cardiologist.

## 2011-09-19 NOTE — Progress Notes (Signed)
Faxed to PCP

## 2011-09-19 NOTE — Assessment & Plan Note (Signed)
EGD

## 2011-09-20 ENCOUNTER — Ambulatory Visit: Payer: Medicare Other | Admitting: Adult Health

## 2011-09-20 ENCOUNTER — Other Ambulatory Visit: Payer: Self-pay

## 2011-09-20 DIAGNOSIS — I10 Essential (primary) hypertension: Secondary | ICD-10-CM

## 2011-09-20 NOTE — Progress Notes (Signed)
Returned call. LMOM to call.  

## 2011-09-20 NOTE — Progress Notes (Signed)
LMOM to call. Stool containers and orders are at front for pick up.

## 2011-09-20 NOTE — Progress Notes (Signed)
[name], Francesca Oman, called and was informed.

## 2011-09-22 ENCOUNTER — Encounter: Payer: Self-pay | Admitting: Adult Health

## 2011-09-22 ENCOUNTER — Ambulatory Visit (INDEPENDENT_AMBULATORY_CARE_PROVIDER_SITE_OTHER): Payer: Medicare Other | Admitting: Adult Health

## 2011-09-22 VITALS — BP 128/82 | HR 101 | Ht 68.0 in | Wt 235.0 lb

## 2011-09-22 DIAGNOSIS — D509 Iron deficiency anemia, unspecified: Secondary | ICD-10-CM

## 2011-09-22 DIAGNOSIS — I4891 Unspecified atrial fibrillation: Secondary | ICD-10-CM

## 2011-09-22 DIAGNOSIS — I509 Heart failure, unspecified: Secondary | ICD-10-CM

## 2011-09-22 MED ORDER — DILTIAZEM HCL 120 MG PO TABS: 120.0000 mg | ORAL_TABLET | Freq: Two times a day (BID) | ORAL | Status: DC

## 2011-09-22 NOTE — Patient Instructions (Signed)
Your physician recommends that you schedule a follow-up appointment in:  1 - 1 WEEK   Your physician has recommended you make the following change in your medication:  INCREASE CARDIZEM TO 120 MG TWICE A DAY

## 2011-09-22 NOTE — Assessment & Plan Note (Signed)
Labs have actually improved. Hgb 10,4 compared to 9.8 on 09/04/11. Blood glucose is elevated and will need better management by PCP

## 2011-09-22 NOTE — Assessment & Plan Note (Signed)
Will check a BNP and CXR today. She has some scattered crackles and has gain 5 lbs since being seen last. Try to avoid admission if we can diurese as OP if necessary. Close follow-up.

## 2011-09-22 NOTE — Progress Notes (Signed)
HPI: Mr.s [name] is a 61 y/o patient of Dr. Dietrich Pates we are following for atrial fibrillation, hypertension,with history of diabetes and orthostatic hypotension. She was last seen by Wynell Balloon PA where she was treated for elevated HR, afib. She was placed on cardizem 60 mg TID and a 24 hour Holter monitor was placed. CBC and BMET were also drawn. She is here for discussion of results and her response to medication. She states that she continues to have rapid HR, sometimes waking her up at night. She is compliant with medications.   Allergies  Allergen Reactions  . Codeine     REACTION: HALLUCINATIONS    Current Outpatient Prescriptions  Medication Sig Dispense Refill  . citalopram (CELEXA) 20 MG tablet Take 20 mg by mouth daily.        Marland Kitchen glipiZIDE (GLUCOTROL) 10 MG tablet Take 10 mg by mouth 2 (two) times daily before a meal.       . insulin glargine (LANTUS) 100 UNIT/ML injection Inject 50 Units into the skin at bedtime.       Marland Kitchen levothyroxine (SYNTHROID, LEVOTHROID) 125 MCG tablet Take 125 mcg by mouth daily.        Marland Kitchen LORazepam (ATIVAN) 1 MG tablet Take 1 mg by mouth 3 (three) times daily.        . metaxalone (SKELAXIN) 800 MG tablet Take 800 mg by mouth 3 (three) times daily.        . metFORMIN (GLUCOPHAGE) 1000 MG tablet Take 1,000 mg by mouth 2 (two) times daily.        . nitroGLYCERIN (NITROSTAT) 0.4 MG SL tablet Place 0.4 mg under the tongue as needed.        Marland Kitchen omeprazole (PRILOSEC) 20 MG capsule Take 20 mg by mouth 2 (two) times daily.        . potassium chloride SA (K-DUR,KLOR-CON) 20 MEQ tablet Take 20 mEq by mouth daily.       . simvastatin (ZOCOR) 40 MG tablet Take 40 mg by mouth at bedtime.        Marland Kitchen warfarin (COUMADIN) 5 MG tablet Take 2.5 mg by mouth daily. At 6pm (1800)      . diltiazem (CARDIZEM) 120 MG tablet Take 1 tablet (120 mg total) by mouth 2 (two) times daily.  60 tablet  12    Past Medical History  Diagnosis Date  . Chest pain     Negative cardiac  catheterization in 2002; negative stress nuclear study in 2008  . Congestive heart failure     Normal LV systolic function  . PAF (paroxysmal atrial fibrillation) 2009  . Chronic anticoagulation   . Diabetes mellitus, type 2     Insulin therapy; exacerbated by prednisone  . Syncope     Admitted 05/2009; magnetic resonance imagin/ MRA - negative; etiology thought to be orthostasis secondary to drugs and dehydration  . GERD (gastroesophageal reflux disease)   . IBS (irritable bowel syndrome)   . Hiatal hernia   . Gastroparesis     99% retention 05/2008 on GES  . Hyperlipidemia   . Hypertension   . Hypothyroid   . Chronic LBP     Surgical intervention in 1996  . Obesity   . OSA on CPAP   . Anemia     H/H of 10/30 with a normal MCV in 12/09  . Barrett's esophagus     Diagnosed 1995. Last EGD 2008.     Past Surgical History  Procedure Date  . Cardiac  catheterization 2002  . Cardiovascular stress test 2008    Stress nuclear study  . Back surgery 1996  . Laparoscopic cholecystectomy 1990s  . Total abdominal hysterectomy 1999  . Laminectomy 1995    L4-L5  . Carpal tunnel release 1994  . Esophagogastroduodenoscopy 2008    Barrett's without dysplasia. esphagus dilated. antral erosions, h.pylori serologies negative.  . Colonoscopy     Patient reports normal TCS around 10 years ago.    NWG:NFAOZH of systems complete and found to be negative unless listed above PHYSICAL EXAM BP 128/82  Pulse 101  Ht 5\' 8"  (1.727 m)  Wt 235 lb (106.595 kg)  BMI 35.73 kg/m2  General: Well developed, well nourished, in no acute distress Head: Eyes PERRLA, No xanthomas.   Normal cephalic and atramatic  Lungs: Clear bilaterally to auscultation and percussion. Heart: HRIR 1/6 systolic murmur.  Pulses are 2+ & equal.            No carotid bruit. No JVD.  No abdominal bruits. No femoral bruits. Abdomen: Bowel sounds are positive, abdomen soft and non-tender without masses or                   Hernia's noted. Msk:  Back normal, normal gait. Normal strength and tone for age. Extremities: No clubbing, cyanosis or edema.  DP +1 Neuro: Alert and oriented X 3. Psych:  Good affect, responds appropriately  YQM:VHQION fib with rate of 101 bpm.  ASSESSMENT AND PLAN

## 2011-09-22 NOTE — Assessment & Plan Note (Signed)
Holter monitor results reveal atrial fibrillation (55-158)/ Rare PVC's verses aberrantly conducted beat. No significant pauses (none > 3 seconds). No symptoms reported.  With her description of feeling rapid HR despite restarting the medication, I will increase cardizem to 120 mg BID. She will return in a week for BP check and EKG. I will see her that day as well.

## 2011-09-26 ENCOUNTER — Ambulatory Visit (INDEPENDENT_AMBULATORY_CARE_PROVIDER_SITE_OTHER): Payer: Medicare Other | Admitting: *Deleted

## 2011-09-26 DIAGNOSIS — I4891 Unspecified atrial fibrillation: Secondary | ICD-10-CM

## 2011-09-26 LAB — POCT INR: INR: 2.6

## 2011-09-26 LAB — CLOSTRIDIUM DIFFICILE BY PCR: Toxigenic C. Difficile by PCR: NOT DETECTED

## 2011-09-26 LAB — GIARDIA/CRYPTOSPORIDIUM (EIA): Giardia Screen (EIA): NEGATIVE

## 2011-09-26 NOTE — Progress Notes (Signed)
Quick Note:  Please call patient. Her stools studies were normal. We are awaiting clearance from her heart doctor prior to procedures. CC: [name], MD CC: Dr. Dietrich Pates & Harrold Donath, NP  ______

## 2011-09-26 NOTE — Progress Notes (Signed)
Quick Note:  Informed pt's daughter, [name]. ______

## 2011-09-27 LAB — STOOL CULTURE

## 2011-09-29 ENCOUNTER — Encounter: Payer: Self-pay | Admitting: Adult Health

## 2011-09-29 ENCOUNTER — Ambulatory Visit (INDEPENDENT_AMBULATORY_CARE_PROVIDER_SITE_OTHER): Payer: Medicare Other | Admitting: Adult Health

## 2011-09-29 VITALS — Ht 68.0 in | Wt 242.0 lb

## 2011-09-29 DIAGNOSIS — I4891 Unspecified atrial fibrillation: Secondary | ICD-10-CM

## 2011-09-29 DIAGNOSIS — D509 Iron deficiency anemia, unspecified: Secondary | ICD-10-CM

## 2011-09-29 DIAGNOSIS — I10 Essential (primary) hypertension: Secondary | ICD-10-CM

## 2011-09-29 NOTE — Assessment & Plan Note (Addendum)
She is followed by PCP for this with labs. Labs from 2 weeks ago are reviewed. Hgb 10.4/Hct 38

## 2011-09-29 NOTE — Assessment & Plan Note (Signed)
Heart rate is mildly elevated today, but she rushed to get here. I rechecked it apically and found it to be in the 90s. BP check 130/90. She is feeling better and has no complaints of bleeding or dizziness. She will continue on current medications and follow-up in coumadin clinic as directed. Will see her in 6 months unless she becomes symptomatic. She is taking lasix prn for LEE with potassium replacement in conjunction with diuretic.

## 2011-09-29 NOTE — Patient Instructions (Signed)
Your physician recommends that you schedule a follow-up appointment in: 6 MONTHS 

## 2011-09-29 NOTE — Assessment & Plan Note (Signed)
Blood pressure is controlled at present. No changes in medications.

## 2011-09-29 NOTE — Progress Notes (Signed)
HPI: [name] is a 61 y/o patient of Dr.Rothbart we are following for atrial fibrillation, followed in our [place] coumadin clinic, hypertension, with history of diabetes. She was placed on long acting cardizem 120 mg BID on last visit. She is here for follow up concerning HR and BP control on this dose. She is upset on this visit, because she was running late and did not have transportation to the office. Her triage BP is indicative of this. She has also not taken the prn lasix she takes for LEE. She is otherwise tolerating the medications well and has noticed a significant improvement in her "palpitations" since being on higher dose diltiazem.   Allergies  Allergen Reactions  . Codeine     REACTION: HALLUCINATIONS    Current Outpatient Prescriptions  Medication Sig Dispense Refill  . citalopram (CELEXA) 20 MG tablet Take 20 mg by mouth daily.        Marland Kitchen diltiazem (CARDIZEM) 120 MG tablet Take 1 tablet (120 mg total) by mouth 2 (two) times daily.  60 tablet  12  . glipiZIDE (GLUCOTROL) 10 MG tablet Take 10 mg by mouth 2 (two) times daily before a meal.       . insulin glargine (LANTUS) 100 UNIT/ML injection Inject 50 Units into the skin at bedtime.       Marland Kitchen levothyroxine (SYNTHROID, LEVOTHROID) 125 MCG tablet Take 125 mcg by mouth daily.        Marland Kitchen LORazepam (ATIVAN) 1 MG tablet Take 1 mg by mouth 3 (three) times daily.        . metaxalone (SKELAXIN) 800 MG tablet Take 800 mg by mouth 3 (three) times daily.        . metFORMIN (GLUCOPHAGE) 1000 MG tablet Take 1,000 mg by mouth 2 (two) times daily.        . nitroGLYCERIN (NITROSTAT) 0.4 MG SL tablet Place 0.4 mg under the tongue as needed.        Marland Kitchen omeprazole (PRILOSEC) 20 MG capsule Take 20 mg by mouth 2 (two) times daily.        . potassium chloride SA (K-DUR,KLOR-CON) 20 MEQ tablet Take 20 mEq by mouth daily.       . simvastatin (ZOCOR) 40 MG tablet Take 40 mg by mouth at bedtime.        Marland Kitchen warfarin (COUMADIN) 5 MG tablet Take 2.5 mg by  mouth daily. At 6pm (1800)        Past Medical History  Diagnosis Date  . Chest pain     Negative cardiac catheterization in 2002; negative stress nuclear study in 2008  . Congestive heart failure     Normal LV systolic function  . PAF (paroxysmal atrial fibrillation) 2009  . Chronic anticoagulation   . Diabetes mellitus, type 2     Insulin therapy; exacerbated by prednisone  . Syncope     Admitted 05/2009; magnetic resonance imagin/ MRA - negative; etiology thought to be orthostasis secondary to drugs and dehydration  . GERD (gastroesophageal reflux disease)   . IBS (irritable bowel syndrome)   . Hiatal hernia   . Gastroparesis     99% retention 05/2008 on GES  . Hyperlipidemia   . Hypertension   . Hypothyroid   . Chronic LBP     Surgical intervention in 1996  . Obesity   . OSA on CPAP   . Anemia     H/H of 10/30 with a normal MCV in 12/09  . Barrett's esophagus  Diagnosed 1995. Last EGD 2008.     Past Surgical History  Procedure Date  . Cardiac catheterization 2002  . Cardiovascular stress test 2008    Stress nuclear study  . Back surgery 1996  . Laparoscopic cholecystectomy 1990s  . Total abdominal hysterectomy 1999  . Laminectomy 1995    L4-L5  . Carpal tunnel release 1994  . Esophagogastroduodenoscopy 2008    Barrett's without dysplasia. esphagus dilated. antral erosions, h.pylori serologies negative.  . Colonoscopy     Patient reports normal TCS around 10 years ago.    ZOX:WRUEAV of systems complete and found to be negative unless listed above PHYSICAL EXAM Ht 5\' 8"  (1.727 m)  Wt 242 lb (109.77 kg)  BMI 36.80 kg/m2  General: Well developed, well nourished, in no acute distress Head: Eyes PERRLA, No xanthomas.   Normal cephalic and atramatic  Lungs: Clear bilaterally to auscultation and percussion. Heart: HRIR without MRG.  Pulses are 2+ & equal.            No carotid bruit. No JVD.  No abdominal bruits. No femoral bruits. Abdomen: Bowel sounds are  positive, abdomen soft and non-tender without masses or                  Hernia's noted. Msk:  Back normal, normal gait. Normal strength and tone for age. Extremities: No clubbing, cyanosis mild non-pitting edema in LE  DP +1 Neuro: Alert and oriented X 3. Psych:  Good affect, responds appropriately  EKG:: Atrial fibrillation rate of 103 bpm.  ASSESSMENT AND PLAN

## 2011-10-13 ENCOUNTER — Telehealth: Payer: Self-pay | Admitting: Urgent Care

## 2011-10-13 NOTE — Telephone Encounter (Signed)
Forwarding to Dr. Marvel Plan nurse, Nelma Rothman.

## 2011-10-13 NOTE — Telephone Encounter (Signed)
Patient needs colonoscopy and EGD. Please contact Dr. Marvel Plan office to see whether we can hold her Coumadin prior to the procedure. Thanks

## 2011-10-13 NOTE — Telephone Encounter (Signed)
Please advise 

## 2011-10-14 ENCOUNTER — Other Ambulatory Visit: Payer: Self-pay | Admitting: Gastroenterology

## 2011-10-14 DIAGNOSIS — D649 Anemia, unspecified: Secondary | ICD-10-CM

## 2011-10-14 DIAGNOSIS — K2270 Barrett's esophagus without dysplasia: Secondary | ICD-10-CM

## 2011-10-14 MED ORDER — PEG 3350-KCL-NA BICARB-NACL 420 G PO SOLR: ORAL | Status: AC

## 2011-10-14 NOTE — Telephone Encounter (Signed)
Please call patient. She needs colonoscopy and EGD scheduled with Dr. Darrick Penna re: iron deficiency anemia, Barrett's esophagus Per Dr. Dietrich Pates she may hold her Coumadin for 4 days prior to the procedure Take 1/2 LANTUS (25 units) instead of full dose the night before the procedure Hold Glucophage the night before the procedure and the morning of the procedure Bring all medications including insulin to the hospital Check blood sugars frequently and call her primary care physician or [place] if you have any problems Thanks

## 2011-10-14 NOTE — Telephone Encounter (Signed)
TCS/EGD SCHEDULED FOR 05/10 - INSTRUCTIONS MAILED

## 2011-10-14 NOTE — Telephone Encounter (Signed)
Warfarin can be held for 4 days prior to procedure with acceptable increase in risk of thromboembolism.

## 2011-10-15 IMAGING — MR MR MRA HEAD W/O CM
10 of 13 series · 27 of 48 positions shown · IV contrast (multihance)
Comparison: None available.

MRI HEAD WITHOUT AND WITH CONTRAST

CLINICAL DATA: Syncope.  Dizziness and chest pain.

MRI HEAD WITHOUT AND WITH CONTRAST
MRA HEAD WITHOUT CONTRAST
TECHNIQUE: Multiplanar, multiecho pulse sequences of the brain and
surrounding structures were obtained according to standard protocol
with and without intravenous contrast.  Angiographic images of the
head were obtained using MRA technique without contrast.
Contrast: 20 ml Multihance

[Series 1: loc · axial · 10.0mm · 0.94mm/px · z∈[-30,+120]mm · 2 of 17 slices shown]
[im 1/17]
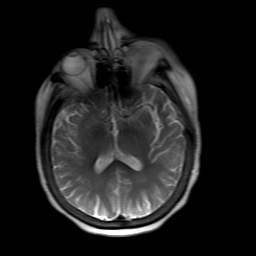
[im 17/17]
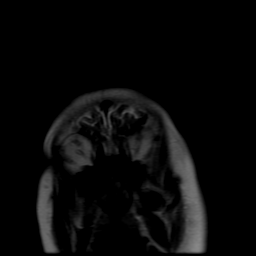

[Series 3: T1 · sagittal · 5.0mm · 0.47mm/px · 2 of 19 slices shown (1 of 2)]
[im 1/19]
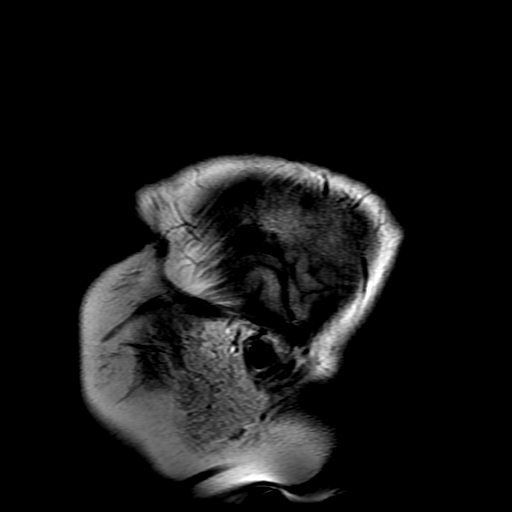
[im 19/19]
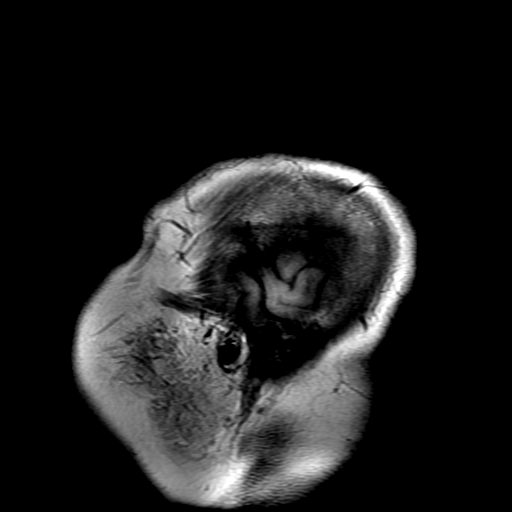

[Series 4: DWI · axial · 5.0mm · 1.09mm/px · z∈[-70,+69]mm · 5 of 56 slices shown (1 of 3)]
[im 1/56]
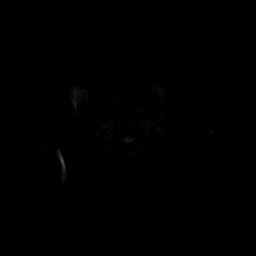
[im 14/56]
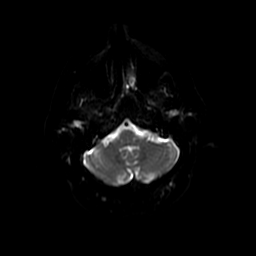
[im 28/56]
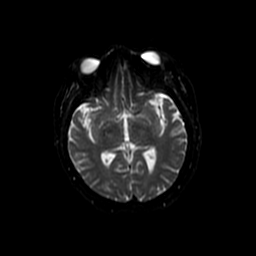
[im 42/56]
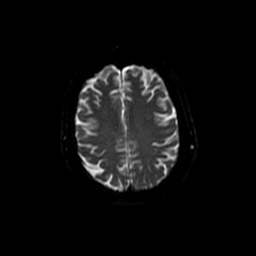
[im 56/56]
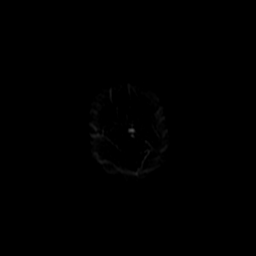

[Series 5: (id) mt fs · axial · 1.4mm · 0.43mm/px · z∈[-87,-47]mm · 4 of 137 slices shown]
[im 1/137]
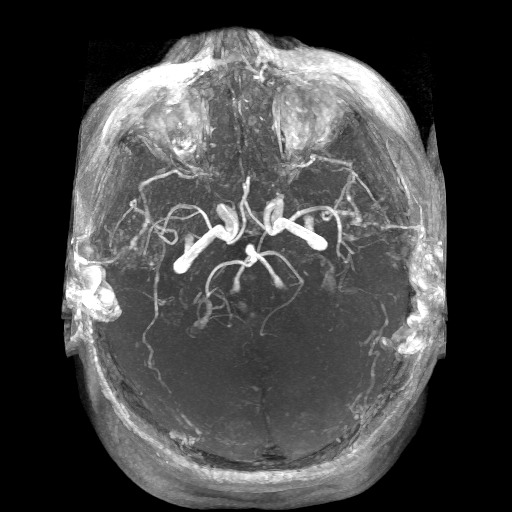
[im 23/137]
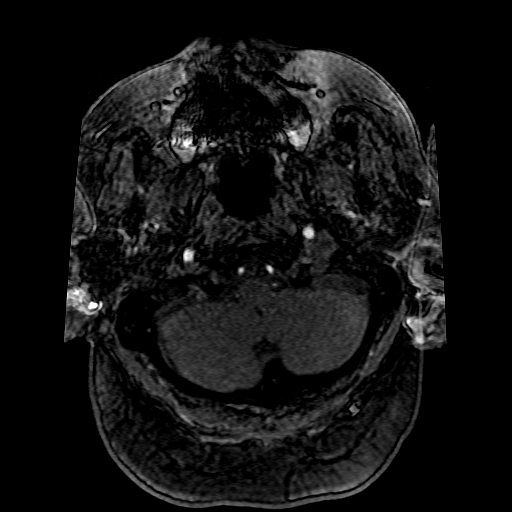
[im 46/137]
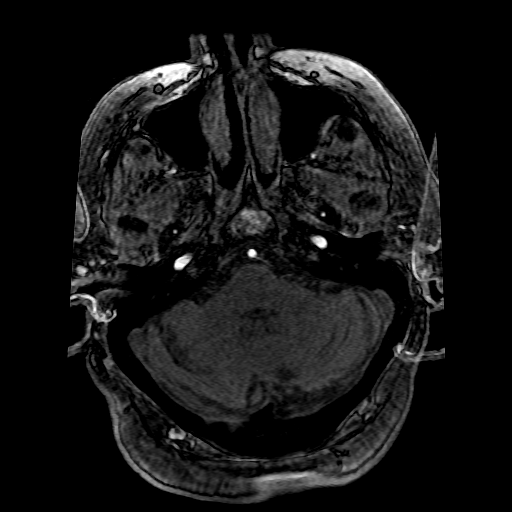
[im 57/137]
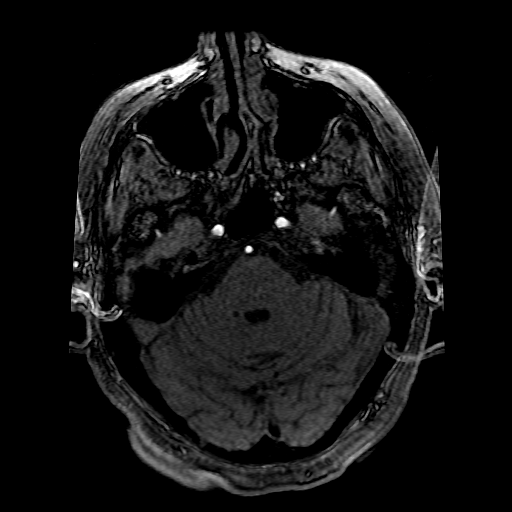

[Series 6: T2 · axial · 5.0mm · 0.43mm/px · z∈[-81,+52]mm · 2 of 21 slices shown]
[im 1/21]
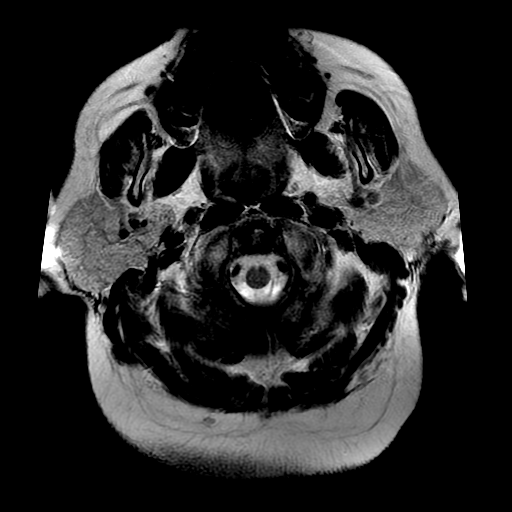
[im 21/21]
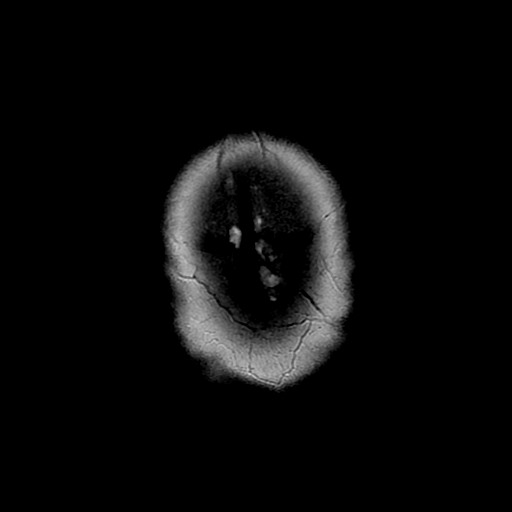

[Series 7: FLAIR · axial · 5.0mm · 0.43mm/px · z∈[-81,+52]mm · 2 of 21 slices shown]
[im 1/21]
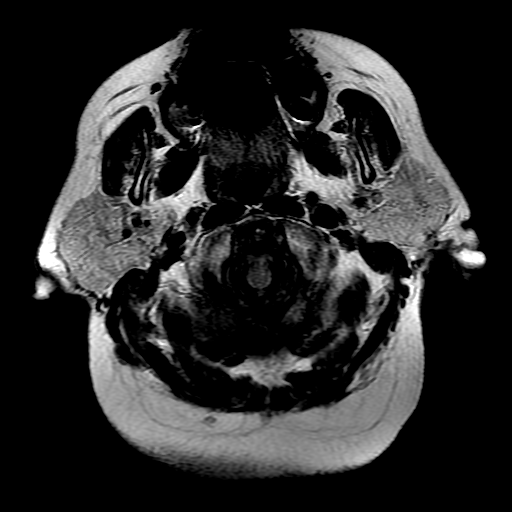
[im 21/21]
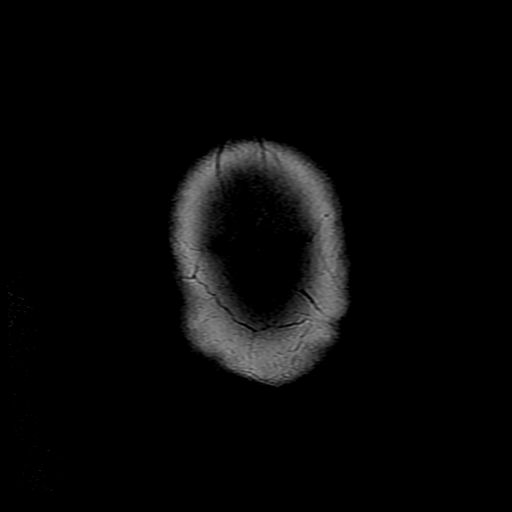

[Series 12: T2 post-contrast · coronal · 5.0mm · 0.39mm/px · 2 of 26 slices shown]
[im 1/26]
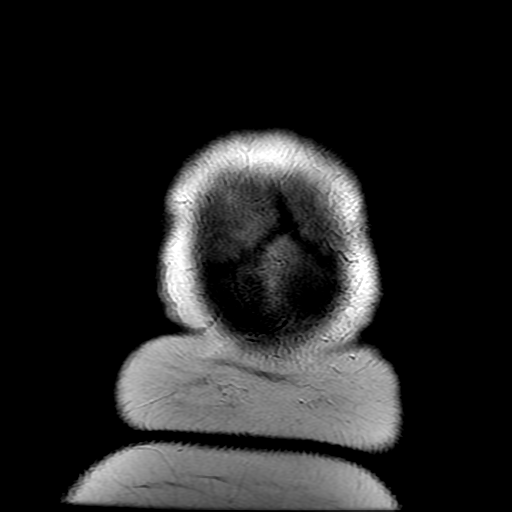
[im 26/26]
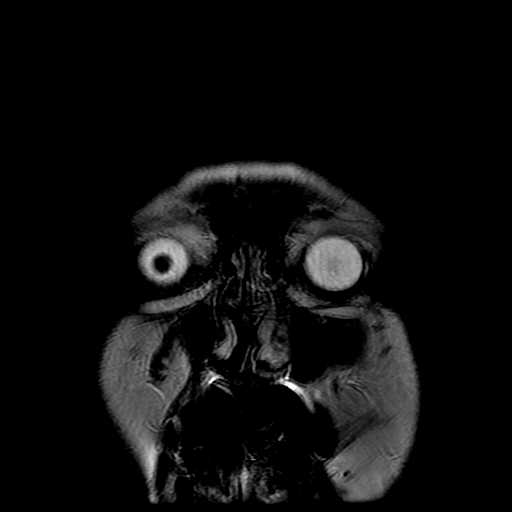

[Series 13: T1 · coronal · 5.0mm · 0.39mm/px · 2 of 26 slices shown (2 of 2)]
[im 1/26]
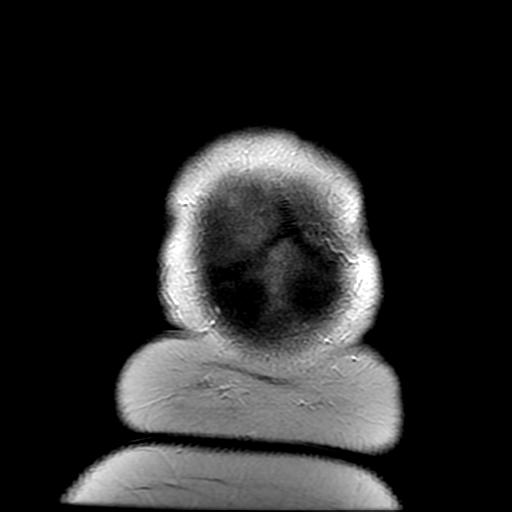
[im 26/26]
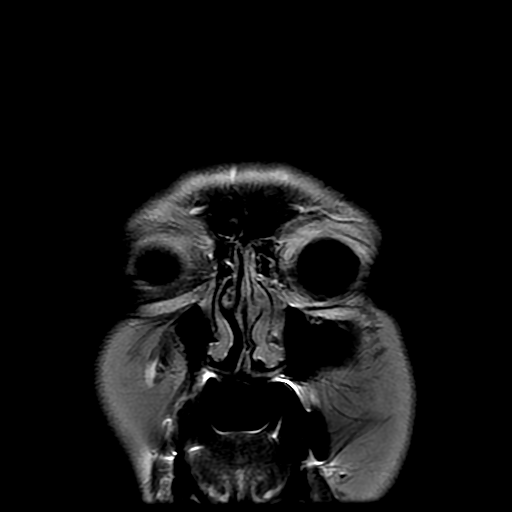

[Series 400: DWI · axial · 5.0mm · 1.09mm/px · z∈[-70,+69]mm · 3 of 28 slices shown (2 of 3)]
[im 1/28]
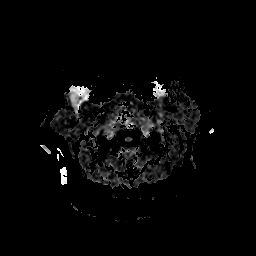
[im 14/28]
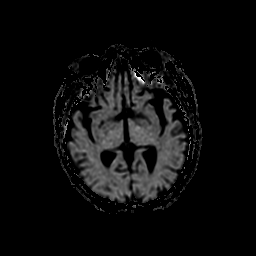
[im 28/28]
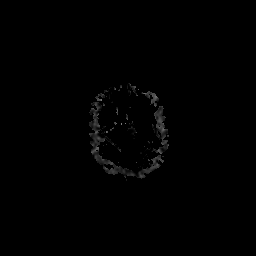

[Series 401: DWI · axial · 5.0mm · 1.09mm/px · z∈[-70,+69]mm · 3 of 28 slices shown (3 of 3)]
[im 1/28]
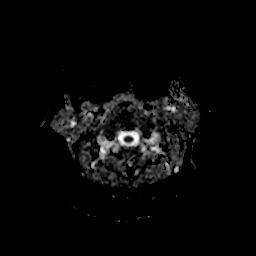
[im 14/28]
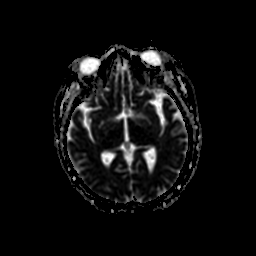
[im 28/28]
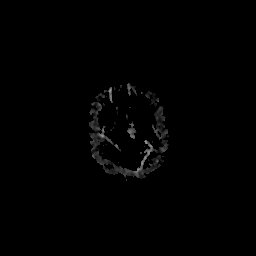

[27 of 48 positions shown; findings below may reference images not displayed]

FINDINGS: Diffusion weighted images demonstrate no evidence for
acute or subacute infarction.  No acute intracranial abnormalities
are present.  Specifically, there is no evidence for acute infarct,
hemorrhage, mass, hydrocephalus, or extra-axial fluid collection.
The postcontrast images demonstrate no areas of pathologic
enhancement.  Flow is present in the major intracranial arteries.
The globes and orbits are intact.  Circumferential mucosal
thickening and enhancement is noted in the right sphenoid sinus.
This likely represents chronic disease.  Minimal mucosal thickening
is present in the anterior ethmoid air cells and frontal sinuses
bilaterally.  The remaining paranasal sinuses are clear.  Minimal
fluid is present in the mastoid air cells.  No obstructing
nasopharyngeal lesion is evident.
IMPRESSION: 1.  No acute intracranial abnormalities.
2.  Left sphenoid sinus disease is likely chronic.

MRA HEAD
FINDINGS: The internal carotid arteries are within normal limits
bilaterally.  The A1 and M1 segments are normal.  A small anterior
communicating artery is noted.  There is segmental narrowing of MCA
branch vessels bilaterally.  No significant proximal occlusion is
evident.

The vertebral arteries are codominant.  The PICA origins are
visualized and within normal limits.  Both posterior cerebral
arteries originate from the basilar tip.  There is attenuation of
distal PCA branch vessels bilaterally.  A prominent left posterior
communicating artery contributes.
IMPRESSION: 1.  Normal variant MRA circle of Willis without significant
proximal stenosis, aneurysm, or branch vessel occlusion.

2.  Small branch vessel attenuation bilaterally, suggestive of
intracranial atherosclerotic change.

## 2011-10-15 IMAGING — CR DG CHEST 1V PORT
1 series · 1 of 1 positions shown · non-contrast
Comparison: Chest radiograph performed 02/11/2009

CLINICAL DATA: Near syncope; history of congestive heart failure,
atrial fibrillation and diabetes.

PORTABLE CHEST - 1 VIEW

[view not recorded]
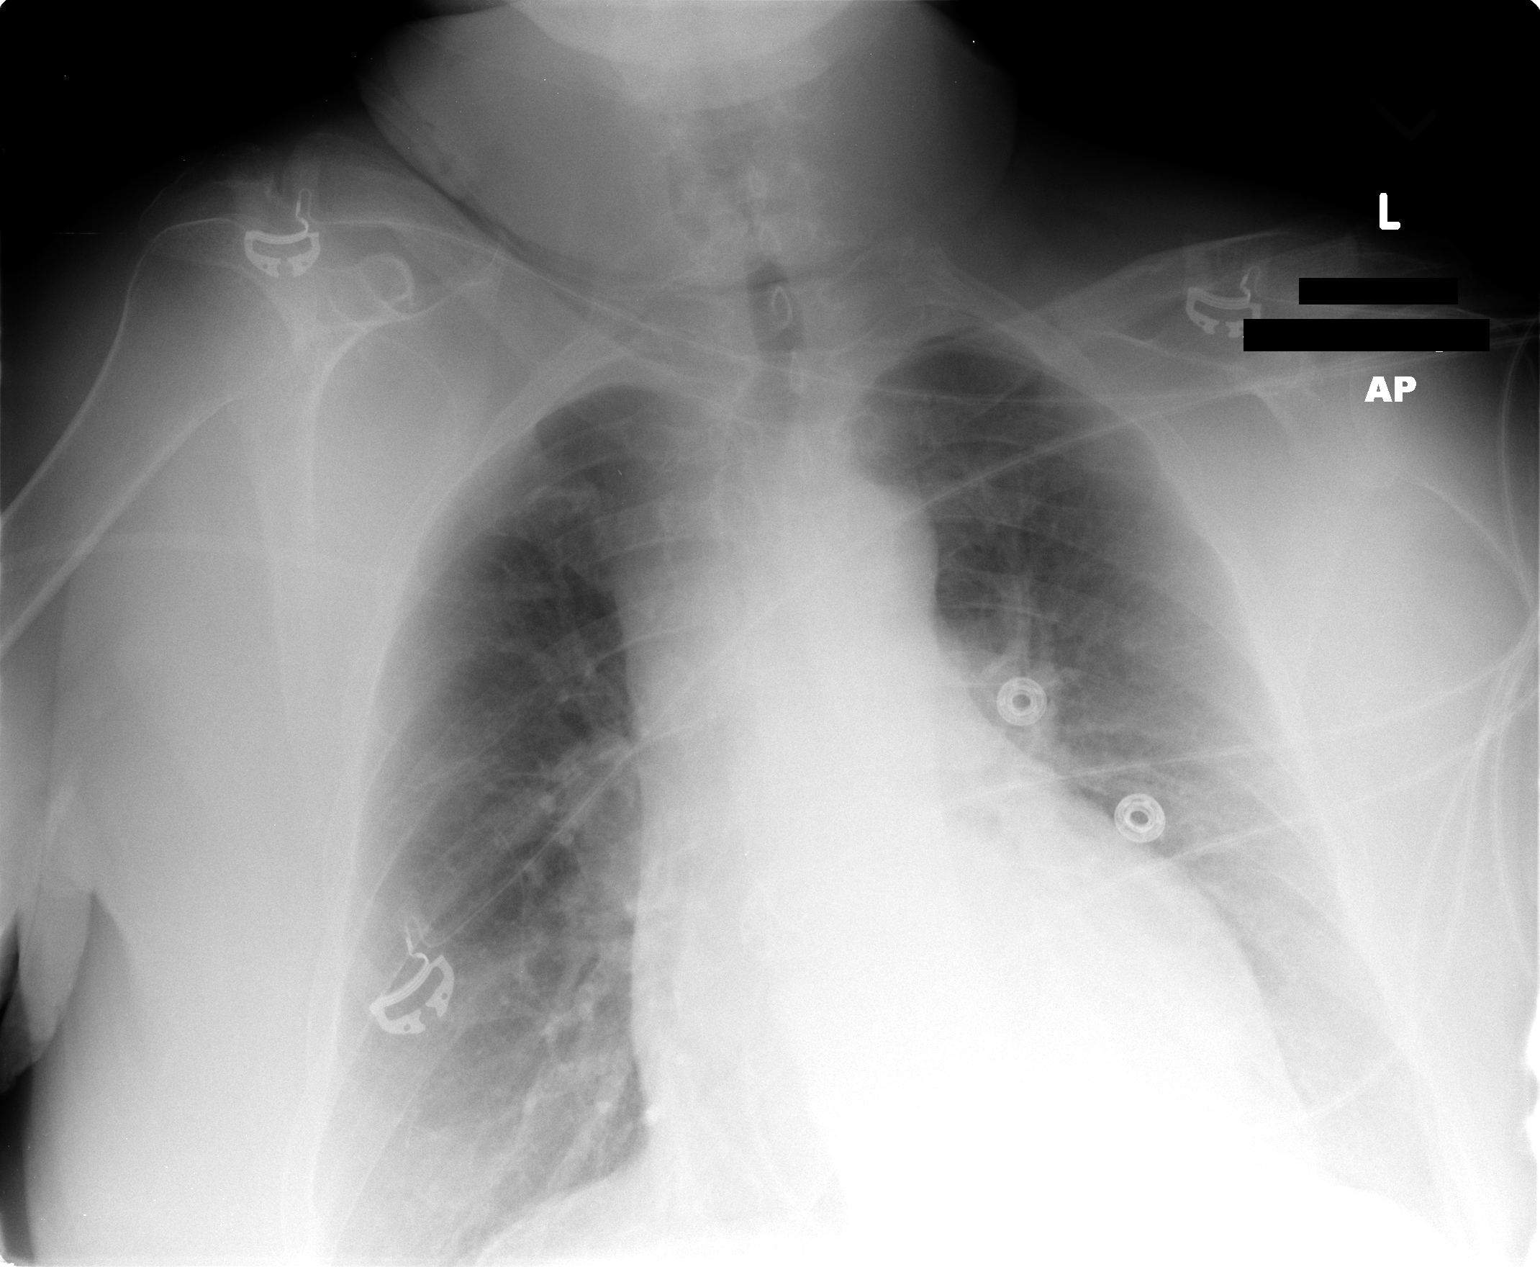

[1 of 1 positions shown; findings below may reference images not displayed]

FINDINGS: The lungs are well-aerated and clear.  There is no
evidence of focal opacification, pleural effusion or pneumothorax.
The right costophrenic angle is not imaged on this study.

The cardiomediastinal silhouette is stable in appearance, given
technique.  No acute osseous abnormalities are seen.
IMPRESSION: No acute cardiopulmonary process seen.

## 2011-10-17 ENCOUNTER — Ambulatory Visit (INDEPENDENT_AMBULATORY_CARE_PROVIDER_SITE_OTHER): Payer: Medicare Other | Admitting: *Deleted

## 2011-10-17 DIAGNOSIS — I4891 Unspecified atrial fibrillation: Secondary | ICD-10-CM

## 2011-10-17 LAB — POCT INR: INR: 1.8

## 2011-10-19 ENCOUNTER — Telehealth: Payer: Self-pay | Admitting: Gastroenterology

## 2011-10-19 NOTE — Telephone Encounter (Signed)
Pt is scheduled for a procedure in May and wants her prep called into [place]

## 2011-10-20 ENCOUNTER — Other Ambulatory Visit: Payer: Self-pay | Admitting: Gastroenterology

## 2011-10-20 MED ORDER — PEG 3350-KCL-NA BICARB-NACL 420 G PO SOLR: ORAL | Status: AC

## 2011-10-20 NOTE — Telephone Encounter (Signed)
Rx faxed to [facility].  

## 2011-10-27 NOTE — Progress Notes (Signed)
EGD FOR ANEMIA/DIARRHEA-GASTRIC/DUODENAL BX TCS ANEMIA/DIARRHEA-RANDOM BX  REVIEWED. AGREE.

## 2011-10-31 ENCOUNTER — Ambulatory Visit (INDEPENDENT_AMBULATORY_CARE_PROVIDER_SITE_OTHER): Payer: Medicare Other | Admitting: *Deleted

## 2011-10-31 DIAGNOSIS — I4891 Unspecified atrial fibrillation: Secondary | ICD-10-CM

## 2011-11-02 HISTORY — PX: ESOPHAGOGASTRODUODENOSCOPY: SHX1529

## 2011-11-02 HISTORY — PX: COLONOSCOPY: SHX174

## 2011-11-09 ENCOUNTER — Encounter (HOSPITAL_COMMUNITY): Payer: Self-pay | Admitting: Pharmacy Technician

## 2011-11-10 MED ORDER — SODIUM CHLORIDE 0.45 % IV SOLN
Freq: Once | INTRAVENOUS | Status: AC
  Administered 2011-11-11: 1000 mL via INTRAVENOUS

## 2011-11-11 ENCOUNTER — Encounter (HOSPITAL_COMMUNITY)
Admission: RE | Disposition: A | Discharge status: Home / Self Care | Payer: Self-pay | Source: Ambulatory Visit | Attending: Gastroenterology

## 2011-11-11 ENCOUNTER — Encounter (HOSPITAL_COMMUNITY): Payer: Self-pay | Admitting: *Deleted

## 2011-11-11 ENCOUNTER — Ambulatory Visit (HOSPITAL_COMMUNITY)
Admission: RE | Admit: 2011-11-11 | Discharge: 2011-11-11 | Disposition: A | Discharge status: Home / Self Care | Payer: Medicare Other | Source: Ambulatory Visit | Attending: Gastroenterology | Admitting: Gastroenterology

## 2011-11-11 DIAGNOSIS — D509 Iron deficiency anemia, unspecified: Secondary | ICD-10-CM | POA: Insufficient documentation

## 2011-11-11 DIAGNOSIS — D649 Anemia, unspecified: Secondary | ICD-10-CM

## 2011-11-11 DIAGNOSIS — K2270 Barrett's esophagus without dysplasia: Secondary | ICD-10-CM | POA: Insufficient documentation

## 2011-11-11 DIAGNOSIS — I10 Essential (primary) hypertension: Secondary | ICD-10-CM | POA: Insufficient documentation

## 2011-11-11 DIAGNOSIS — E785 Hyperlipidemia, unspecified: Secondary | ICD-10-CM | POA: Insufficient documentation

## 2011-11-11 DIAGNOSIS — E119 Type 2 diabetes mellitus without complications: Secondary | ICD-10-CM | POA: Insufficient documentation

## 2011-11-11 DIAGNOSIS — K2990 Gastroduodenitis, unspecified, without bleeding: Secondary | ICD-10-CM

## 2011-11-11 DIAGNOSIS — K648 Other hemorrhoids: Secondary | ICD-10-CM | POA: Insufficient documentation

## 2011-11-11 DIAGNOSIS — K5730 Diverticulosis of large intestine without perforation or abscess without bleeding: Secondary | ICD-10-CM | POA: Insufficient documentation

## 2011-11-11 DIAGNOSIS — K5710 Diverticulosis of small intestine without perforation or abscess without bleeding: Secondary | ICD-10-CM | POA: Insufficient documentation

## 2011-11-11 DIAGNOSIS — Z01812 Encounter for preprocedural laboratory examination: Secondary | ICD-10-CM | POA: Insufficient documentation

## 2011-11-11 DIAGNOSIS — R197 Diarrhea, unspecified: Secondary | ICD-10-CM

## 2011-11-11 DIAGNOSIS — G4733 Obstructive sleep apnea (adult) (pediatric): Secondary | ICD-10-CM | POA: Insufficient documentation

## 2011-11-11 DIAGNOSIS — Z7901 Long term (current) use of anticoagulants: Secondary | ICD-10-CM | POA: Insufficient documentation

## 2011-11-11 DIAGNOSIS — K319 Disease of stomach and duodenum, unspecified: Secondary | ICD-10-CM | POA: Insufficient documentation

## 2011-11-11 DIAGNOSIS — Z79899 Other long term (current) drug therapy: Secondary | ICD-10-CM | POA: Insufficient documentation

## 2011-11-11 DIAGNOSIS — Z794 Long term (current) use of insulin: Secondary | ICD-10-CM | POA: Insufficient documentation

## 2011-11-11 DIAGNOSIS — K2970 Gastritis, unspecified, without bleeding: Secondary | ICD-10-CM

## 2011-11-11 SURGERY — COLONOSCOPY WITH ESOPHAGOGASTRODUODENOSCOPY (EGD)
Anesthesia: Moderate Sedation

## 2011-11-11 MED ORDER — MIDAZOLAM HCL 5 MG/5ML IJ SOLN
INTRAMUSCULAR | Status: DC | PRN
  Administered 2011-11-11 (×2): 1 mg via INTRAVENOUS
  Administered 2011-11-11 (×2): 2 mg via INTRAVENOUS

## 2011-11-11 MED ORDER — MEPERIDINE HCL 100 MG/ML IJ SOLN
INTRAMUSCULAR | Status: AC
  Filled 2011-11-11: qty 2

## 2011-11-11 MED ORDER — BUTAMBEN-TETRACAINE-BENZOCAINE 2-2-14 % EX AERO
INHALATION_SPRAY | CUTANEOUS | Status: DC | PRN
  Administered 2011-11-11: 2 via TOPICAL

## 2011-11-11 MED ORDER — MEPERIDINE HCL 100 MG/ML IJ SOLN
INTRAMUSCULAR | Status: DC | PRN
  Administered 2011-11-11: 50 mg via INTRAVENOUS
  Administered 2011-11-11: 25 mg via INTRAVENOUS
  Administered 2011-11-11: 50 mg via INTRAVENOUS

## 2011-11-11 MED ORDER — STERILE WATER FOR IRRIGATION IR SOLN
Status: DC | PRN
  Administered 2011-11-11: 09:00:00

## 2011-11-11 MED ORDER — MIDAZOLAM HCL 5 MG/5ML IJ SOLN
INTRAMUSCULAR | Status: AC
  Filled 2011-11-11: qty 10

## 2011-11-11 MED ORDER — MINERAL OIL PO OIL
TOPICAL_OIL | ORAL | Status: AC
  Filled 2011-11-11: qty 30

## 2011-11-11 NOTE — H&P (Signed)
Primary Care Physician:  [name], MD, MD Primary Gastroenterologist:  Dr. Darrick Penna  Pre-Procedure History & Physical: HPI:  [name] is a 61 y.o. female here for ANEMIA/DIARRHEA.  Past Medical History  Diagnosis Date  . Chest pain     Negative cardiac catheterization in 2002; negative stress nuclear study in 2008  . Congestive heart failure     Normal LV systolic function  . PAF (paroxysmal atrial fibrillation) 2009  . Chronic anticoagulation   . Diabetes mellitus, type 2     Insulin therapy; exacerbated by prednisone  . Syncope     Admitted 05/2009; magnetic resonance imagin/ MRA - negative; etiology thought to be orthostasis secondary to drugs and dehydration  . GERD (gastroesophageal reflux disease)   . IBS (irritable bowel syndrome)   . Hiatal hernia   . Gastroparesis     99% retention 05/2008 on GES  . Hyperlipidemia   . Hypertension   . Hypothyroid   . Chronic LBP     Surgical intervention in 1996  . Obesity   . OSA on CPAP   . Anemia     H/H of 10/30 with a normal MCV in 12/09  . Barrett's esophagus     Diagnosed 1995. Last EGD 2008.     Past Surgical History  Procedure Date  . Cardiac catheterization 2002  . Cardiovascular stress test 2008    Stress nuclear study  . Back surgery 1996  . Laparoscopic cholecystectomy 1990s  . Total abdominal hysterectomy 1999  . Laminectomy 1995    L4-L5  . Carpal tunnel release 1994  . Esophagogastroduodenoscopy 2008    Barrett's without dysplasia. esphagus dilated. antral erosions, h.pylori serologies negative.  . Colonoscopy     Patient reports normal TCS around 10 years ago.    Prior to Admission medications   Medication Sig Start Date End Date Taking? Authorizing Provider  citalopram (CELEXA) 20 MG tablet Take 20 mg by mouth every morning.    Yes Historical Provider, MD  diltiazem (CARDIZEM) 120 MG tablet Take 1 tablet (120 mg total) by mouth 2 (two) times daily. 09/22/11 09/21/12 Yes Jodelle Gross, NP  diphenhydrAMINE (ALKA-SELTZER PLUS ALLERGY) 25 MG tablet Take 25 mg by mouth every 6 (six) hours as needed. For allergies   Yes Historical Provider, MD  glipiZIDE (GLUCOTROL) 10 MG tablet Take 10 mg by mouth 2 (two) times daily before a meal.    Yes Historical Provider, MD  insulin glargine (LANTUS) 100 UNIT/ML injection Inject 50 Units into the skin at bedtime.    Yes Historical Provider, MD  levothyroxine (SYNTHROID, LEVOTHROID) 175 MCG tablet Take 175 mcg by mouth every morning.   Yes Historical Provider, MD  LORazepam (ATIVAN) 1 MG tablet Take 1 mg by mouth 3 (three) times daily as needed. For anxiety   Yes Historical Provider, MD  metaxalone (SKELAXIN) 800 MG tablet Take 800 mg by mouth 3 (three) times daily.    Yes Historical Provider, MD  metFORMIN (GLUCOPHAGE) 500 MG tablet Take 1,000 mg by mouth 2 (two) times daily with a meal.   Yes Historical Provider, MD  Multiple Vitamin (MULITIVITAMIN WITH MINERALS) TABS Take 1 tablet by mouth every morning.   Yes Historical Provider, MD  omeprazole (PRILOSEC) 20 MG capsule Take 20 mg by mouth 2 (two) times daily.     Yes Historical Provider, MD  potassium chloride SA (K-DUR,KLOR-CON) 20 MEQ tablet Take 20 mEq by mouth every morning.    Yes Historical Provider, MD  simvastatin (ZOCOR) 40 MG tablet Take 40 mg by mouth at bedtime.     Yes Historical Provider, MD  warfarin (COUMADIN) 5 MG tablet Take 2.5 mg by mouth every evening. At 6pm (1800)   Yes Historical Provider, MD  ACCU-CHEK AVIVA PLUS test strip  10/20/11   Historical Provider, MD  ACCU-CHEK FASTCLIX LANCETS MISC  08/06/11   Historical Provider, MD  Blood Glucose Monitoring Suppl (ACCU-CHEK AVIVA PLUS) W/DEVICE KIT  08/06/11   Historical Provider, MD  nitroGLYCERIN (NITROSTAT) 0.4 MG SL tablet Place 0.4 mg under the tongue as needed. For chest pain     Historical Provider, MD    Allergies as of 10/14/2011 - Review Complete 09/29/2011  Allergen Reaction Noted  . Codeine       Family History  Problem Relation Age of Onset  . Hypertension Mother   . Alzheimer's disease Mother   . Stroke Mother   . Heart attack Mother   . Heart disease Neg Hx   . Hypertension Other   . Colon cancer Neg Hx     History   Social History  . Marital Status: Married    Spouse Name: N/A    Number of Children: N/A  . Years of Education: N/A   Occupational History  . 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     Social History Main Topics  . Smoking status: Former Games developer  . Smokeless tobacco: Former Neurosurgeon   Comment: Remote  . Alcohol Use: No     last etoh one year ago, never frequent  . Drug Use: No  . Sexually Active: Yes    Birth Control/ Protection: None   Other Topics Concern  . Not on file   Social History Narrative   Sedentary4 children, "blended family"    Review of Systems: See HPI, otherwise negative ROS   Physical Exam: BP 137/73  Pulse 89  Temp(Src) 98.9 F (37.2 C) (Oral)  Resp 16  Ht 5\' 8"  (1.727 m)  Wt 235 lb (106.595 kg)  BMI 35.73 kg/m2  SpO2 97% General:   Alert,  pleasant and cooperative in NAD Head:  Normocephalic and atraumatic. Neck:  Supple;  Lungs:  Clear throughout to auscultation.    Heart:  Regular rate and rhythm. Abdomen:  Soft, nontender and nondistended. Normal bowel sounds, without guarding, and without rebound.   Neurologic:  Alert and  oriented x4;  grossly normal neurologically.  Impression/Plan:    Anemia/diarrhea  PLAN: EGD/TCS TODAY

## 2011-11-11 NOTE — Discharge Instructions (Signed)
You have internal hemorrhoids & diverticulosis. You have MILD gastritis. You had a DIVERTICULUM in your duodenum. I biopsied your stomach, duodenum, AND COLON.  CONTINUE OMEPRAZOLE EVERY MORNING. START COUMADIN MAY 13. IMODIUM AS NEEDED FOR DIARRHEA.  AVOID ITEMS THAT TRIGGER GASTRITIS. SEE INFO BELOW. FOLLOW A HIGH FIBER/LOW FAT DIET. AVOID ITEMS THAT CAUSE BLOATING. SEE INFO BELOW.   YOUR BIOPSY RESULTS WILL BE AVAILABLE IN 7 DAYS. NEXT UPPER ENDOSCOPY IN 3 YEARS. Next colonoscopy in 10 years.  ENDOSCOPY Care After Read the instructions outlined below and refer to this sheet in the next week. These discharge instructions provide you with general information on caring for yourself after you leave the hospital. While your treatment has been planned according to the most current medical practices available, unavoidable complications occasionally occur. If you have any problems or questions after discharge, call DR. Wrenna Saks, [phone].  ACTIVITY  You may resume your regular activity, but move at a slower pace for the next 24 hours.   Take frequent rest periods for the next 24 hours.   Walking will help get rid of the air and reduce the bloated feeling in your belly (abdomen).   No driving for 24 hours (because of the medicine (anesthesia) used during the test).   You may shower.   Do not sign any important legal documents or operate any machinery for 24 hours (because of the anesthesia used during the test).    NUTRITION  Drink plenty of fluids.   You may resume your normal diet as instructed by your doctor.   Begin with a light meal and progress to your normal diet. Heavy or fried foods are harder to digest and may make you feel sick to your stomach (nauseated).   Avoid alcoholic beverages for 24 hours or as instructed.    MEDICATIONS  You may resume your normal medications.   WHAT YOU CAN EXPECT TODAY  Some feelings of bloating in the abdomen.   Passage of more  gas than usual.   Spotting of blood in your stool or on the toilet paper  .  IF YOU HAD POLYPS REMOVED DURING THE ENDOSCOPY:  Eat a soft diet IF YOU HAVE NAUSEA, BLOATING, ABDOMINAL PAIN, OR VOMITING.    FINDING OUT THE RESULTS OF YOUR TEST Not all test results are available during your visit. DR. Darrick Penna WILL CALL YOU WITHIN 7 DAYS OF YOUR PROCEDUE WITH YOUR RESULTS. Do not assume everything is normal if you have not heard from DR. Estephania Licciardi IN ONE WEEK, CALL HER OFFICE AT [phone].  SEEK IMMEDIATE MEDICAL ATTENTION AND CALL THE OFFICE: [phone] IF:  You have more than a spotting of blood in your stool.   Your belly is swollen (abdominal distention).   You are nauseated or vomiting.   You have a temperature over 101F.   You have abdominal pain or discomfort that is severe or gets worse throughout the day.   Gastritis  Gastritis is an inflammation (the body's way of reacting to injury and/or infection) of the stomach. It is often caused by viral or bacterial (germ) infections. It can also be caused BY ASPIRIN, BC/GOODY POWDER'S, (IBUPROFEN) MOTRIN, OR ALEVE (NAPROXEN), chemicals (including alcohol), SPICY FOODS, and medications. This illness may be associated with generalized malaise (feeling tired, not well), UPPER ABDOMINAL STOMACH cramps, and fever. One common bacterial cause of gastritis is an organism known as H. Pylori. This can be treated with antibiotics.    High-Fiber Diet A high-fiber diet changes your normal diet to  include more whole grains, legumes, fruits, and vegetables. Changes in the diet involve replacing refined carbohydrates with unrefined foods. The calorie level of the diet is essentially unchanged. The Dietary Reference Intake (recommended amount) for adult males is 38 grams per day. For adult females, it is 25 grams per day. Pregnant and lactating women should consume 28 grams of fiber per day. Fiber is the intact part of a plant that is not broken down  during digestion. Functional fiber is fiber that has been isolated from the plant to provide a beneficial effect in the body. PURPOSE  Increase stool bulk.   Ease and regulate bowel movements.   Lower cholesterol.  INDICATIONS THAT YOU NEED MORE FIBER  Constipation and hemorrhoids.   Uncomplicated diverticulosis (intestine condition) and irritable bowel syndrome.   Weight management.   As a protective measure against hardening of the arteries (atherosclerosis), diabetes, and cancer.   GUIDELINES FOR INCREASING FIBER IN THE DIET  Start adding fiber to the diet slowly. A gradual increase of about 5 more grams (2 slices of whole-wheat bread, 2 servings of most fruits or vegetables, or 1 bowl of high-fiber cereal) per day is best. Too rapid an increase in fiber may result in constipation, flatulence, and bloating.   Drink enough water and fluids to keep your urine clear or pale yellow. Water, juice, or caffeine-free drinks are recommended. Not drinking enough fluid may cause constipation.   Eat a variety of high-fiber foods rather than one type of fiber.   Try to increase your intake of fiber through using high-fiber foods rather than fiber pills or supplements that contain small amounts of fiber.   The goal is to change the types of food eaten. Do not supplement your present diet with high-fiber foods, but replace foods in your present diet.  INCLUDE A VARIETY OF FIBER SOURCES  Replace refined and processed grains with whole grains, canned fruits with fresh fruits, and incorporate other fiber sources. White rice, white breads, and most bakery goods contain little or no fiber.   Brown whole-grain rice, buckwheat oats, and many fruits and vegetables are all good sources of fiber. These include: broccoli, Brussels sprouts, cabbage, cauliflower, beets, sweet potatoes, white potatoes (skin on), carrots, tomatoes, eggplant, squash, berries, fresh fruits, and dried fruits.   Cereals appear  to be the richest source of fiber. Cereal fiber is found in whole grains and bran. Bran is the fiber-rich outer coat of cereal grain, which is largely removed in refining. In whole-grain cereals, the bran remains. In breakfast cereals, the largest amount of fiber is found in those with "bran" in their names. The fiber content is sometimes indicated on the label.   You may need to include additional fruits and vegetables each day.   In baking, for 1 cup white flour, you may use the following substitutions:   1 cup whole-wheat flour minus 2 tablespoons.   1/2 cup white flour plus 1/2 cup whole-wheat flour.   Low-Fat Diet BREADS, CEREALS, PASTA, RICE, DRIED PEAS, AND BEANS These products are high in carbohydrates and most are low in fat. Therefore, they can be increased in the diet as substitutes for fatty foods. They too, however, contain calories and should not be eaten in excess. Cereals can be eaten for snacks as well as for breakfast.  Include foods that contain fiber (fruits, vegetables, whole grains, and legumes). Research shows that fiber may lower blood cholesterol levels, especially the water-soluble fiber found in fruits, vegetables, oat products, and legumes.  FRUITS AND VEGETABLES It is good to eat fruits and vegetables. Besides being sources of fiber, both are rich in vitamins and some minerals. They help you get the daily allowances of these nutrients. Fruits and vegetables can be used for snacks and desserts. MEATS Limit lean meat, chicken, [place], and fish to no more than 6 ounces per day. Beef, Pork, and Lamb Use lean cuts of beef, pork, and lamb. Lean cuts include:  Extra-lean ground beef.  Arm roast.  Sirloin tip.  Center-cut ham.  Round steak.  Loin chops.  Rump roast.  Tenderloin.  Trim all fat off the outside of meats before cooking. It is not necessary to severely decrease the intake of red meat, but lean choices should be made. Lean meat is rich in protein and  contains a highly absorbable form of iron. Premenopausal women, in particular, should avoid reducing lean red meat because this could increase the risk for low red blood cells (iron-deficiency anemia). The organ meats, such as liver, sweetbreads, kidneys, and brain are very rich in cholesterol. They should be limited. Chicken and [place] These are good sources of protein. The fat of poultry can be reduced by removing the skin and underlying fat layers before cooking. Chicken and [place] can be substituted for lean red meat in the diet. Poultry should not be fried or covered with high-fat sauces. Fish and Shellfish Fish is a good source of protein. Shellfish contain cholesterol, but they usually are low in saturated fatty acids. The preparation of fish is important. Like chicken and [place], they should not be fried or covered with high-fat sauces. EGGS Egg whites contain no fat or cholesterol. They can be eaten often. Try 1 to 2 egg whites instead of whole eggs in recipes or use egg substitutes that do not contain yolk. MILK AND DAIRY PRODUCTS Use skim or 1% milk instead of 2% or whole milk. Decrease whole milk, natural, and processed cheeses. Use nonfat or low-fat (2%) cottage cheese or low-fat cheeses made from vegetable oils. Choose nonfat or low-fat (1 to 2%) yogurt. Experiment with evaporated skim milk in recipes that call for heavy cream. Substitute low-fat yogurt or low-fat cottage cheese for sour cream in dips and salad dressings. Have at least 2 servings of low-fat dairy products, such as 2 glasses of skim (or 1%) milk each day to help get your daily calcium intake.  FATS AND OILS Reduce the total intake of fats, especially saturated fat. Butterfat, lard, and beef fats are high in saturated fat and cholesterol. These should be avoided as much as possible. Vegetable fats do not contain cholesterol, but certain vegetable fats, such as coconut oil, palm oil, and palm kernel oil are very high in  saturated fats. These should be limited. These fats are often used in bakery goods, processed foods, popcorn, oils, and nondairy creamers. Vegetable shortenings and some peanut butters contain hydrogenated oils, which are also saturated fats. Read the labels on these foods and check for saturated vegetable oils. Unsaturated vegetable oils and fats do not raise blood cholesterol. However, they should be limited because they are fats and are high in calories. Total fat should still be limited to 30% of your daily caloric intake. Desirable liquid vegetable oils are corn oil, cottonseed oil, olive oil, canola oil, safflower oil, soybean oil, and sunflower oil. Peanut oil is not as good, but small amounts are acceptable. Buy a heart-healthy tub margarine that has no partially hydrogenated oils in the ingredients. Mayonnaise and salad dressings often  are made from unsaturated fats, but they should also be limited because of their high calorie and fat content. Seeds, nuts, peanut butter, olives, and avocados are high in fat, but the fat is mainly the unsaturated type. These foods should be limited mainly to avoid excess calories and fat. OTHER EATING TIPS Snacks  Most sweets should be limited as snacks. They tend to be rich in calories and fats, and their caloric content outweighs their nutritional value. Some good choices in snacks are graham crackers, melba toast, soda crackers, bagels (no egg), English muffins, fruits, and vegetables. These snacks are preferable to snack crackers, [place] fries, and chips. Popcorn should be air-popped or cooked in small amounts of liquid vegetable oil. Desserts Eat fruit, low-fat yogurt, and fruit ices. AVOID pastries, cake, and cookies. Sherbet, angel food cake, gelatin dessert, frozen low-fat yogurt, or other frozen products that do not contain saturated fat (pure fruit juice bars, frozen ice pops) are also acceptable.  COOKING METHODS Choose those methods that use little or  no fat. They include: Poaching.  Braising.  Steaming.  Grilling.  Baking.  Stir-frying.  Broiling.  Microwaving.  Foods can be cooked in a nonstick pan without added fat, or use a nonfat cooking spray in regular cookware. Limit fried foods and avoid frying in saturated fat. Add moisture to lean meats by using water, broth, cooking wines, and other nonfat or low-fat sauces along with the cooking methods mentioned above. Soups and stews should be chilled after cooking. The fat that forms on top after a few hours in the refrigerator should be skimmed off. When preparing meals, avoid using excess salt. Salt can contribute to raising blood pressure in some people. EATING AWAY FROM HOME Order entres, potatoes, and vegetables without sauces or butter. When meat exceeds the size of a deck of cards (3 to 4 ounces), the rest can be taken home for another meal. Choose vegetable or fruit salads and ask for low-calorie salad dressings to be served on the side. Use dressings sparingly. Limit high-fat toppings, such as bacon, crumbled eggs, cheese, sunflower seeds, and olives. Ask for heart-healthy tub margarine instead of butter.   Diverticulosis Diverticulosis is a common condition that develops when small pouches (diverticula) form in the wall of the colon. The risk of diverticulosis increases with age. It happens more often in people who eat a low-fiber diet. Most individuals with diverticulosis have no symptoms. Those individuals with symptoms usually experience belly (abdominal) pain, constipation, or loose stools (diarrhea).  HOME CARE INSTRUCTIONS  Increase the amount of fiber in your diet as directed by your caregiver or dietician. This may reduce symptoms of diverticulosis.   Drink at least 6 to 8 glasses of water each day to prevent constipation.   Try not to strain when you have a bowel movement.   Avoiding nuts and seeds to prevent complications is still an uncertain benefit.        FOODS HAVING HIGH FIBER CONTENT INCLUDE:  Fruits. Apple, peach, pear, tangerine, raisins, prunes.   Vegetables. Brussels sprouts, asparagus, broccoli, cabbage, carrot, cauliflower, romaine lettuce, spinach, summer squash, tomato, winter squash, zucchini.   Starchy Vegetables. Baked beans, kidney beans, lima beans, split peas, lentils, potatoes (with skin).   Grains. Whole wheat bread, brown rice, bran flake cereal, plain oatmeal, white rice, shredded wheat, bran muffins.    SEEK IMMEDIATE MEDICAL CARE IF:  You develop increasing pain or severe bloating.   You have an oral temperature above 101F.   You develop  vomiting or bowel movements that are bloody or black.    Hemorrhoids Hemorrhoids are dilated (enlarged) veins around the rectum. Sometimes clots will form in the veins. This makes them swollen and painful. These are called thrombosed hemorrhoids. Causes of hemorrhoids include:  Constipation.   Straining to have a bowel movement.   HEAVY LIFTING HOME CARE INSTRUCTIONS  Eat a well balanced diet and drink 6 to 8 glasses of water every day to avoid constipation. You may also use a bulk laxative.   Avoid straining to have bowel movements.   Keep anal area dry and clean.   Do not use a donut shaped pillow or sit on the toilet for long periods. This increases blood pooling and pain.   Move your bowels when your body has the urge; this will require less straining and will decrease pain and pressure.

## 2011-11-12 NOTE — Op Note (Signed)
[facility] [address] [place], [place]  [address]  COLONOSCOPY PROCEDURE REPORT  PATIENT:  Audrey, Thull  MR#:  [id number] BIRTHDATE:  Nov 12, 1950, 60 yrs. old  GENDER:  female  ENDOSCOPIST:  Jonette Eva, MD REF. BY:  Gareth Morgan, M.D. ASSISTANT:  PROCEDURE DATE:  11/11/2011 PROCEDURE:  Colonoscopy WITH BIOPSY  INDICATIONS:  ANEMIA/DIARRHEA: BAD IN DEC, NOW INTERITTENT  MEDICATIONS:   Demerol 100 mg IV, Versed 4 mg IV+EGD  DESCRIPTION OF PROCEDURE:    Physical exam was performed. Informed consent was obtained from the patient after explaining the benefits, risks, and alternatives to procedure.  The patient was connected to monitor and placed in left lateral position. Continuous oxygen was provided by nasal cannula and IV medicine administered through an indwelling cannula.  After administration of sedation and rectal exam, the patient's rectum was intubated and the [id number] ([id number]) colonoscope was advanced under direct visualization to the cecum.  The scope was removed slowly by carefully examining the color, texture, anatomy, and integrity mucosa on the way out.  The patient was recovered in endoscopy and discharged home in satisfactory condition. <<PROCEDUREIMAGES>>  FINDINGS:  FREQUENT Diverticula were found IN THE ASCENDING AND SIGMOID COLON. RANDMO BIOPSIES OBTAINED TO EVALUATE FOR MICROSCOPIC COLITIS. NO POLYPS  SMALL Internal Hemorrhoids were found.  PREP QUALITY: GOOD CECAL W/D TIME:   20  minutes  COMPLICATIONS:    None  ENDOSCOPIC IMPRESSION: 1) Internal hemorrhoids 2) MILD DIVERTICULOSIS 3) NO SOURCE FOR ANEMIA/DIARREA IDENTIFIED  RECOMMENDATIONS:  AWAIT BIOPSIES HIGH IBER DIET TCS IN 10 YEARS WITH OVERTUBE PROCED WITH EGD  REPEAT EXAM:  No  ______________________________ Jonette Eva, MD  CC:  Gareth Morgan, M.D.  n. eSIGNED:   Megha Agnes at 11/12/2011 11:16 AM  [place], [phone]

## 2011-11-12 NOTE — Op Note (Signed)
[facility] [address] [place], [place]  [address]  ENDOSCOPY PROCEDURE REPORT  PATIENT:  [name], [name]  MR#:  [id number] BIRTHDATE:  02/24/1951, 60 yrs. old  GENDER:  female  ENDOSCOPIST:  Jonette Eva, MD Referred by:  Gareth Morgan, M.D.  PROCEDURE DATE:  11/11/2011 PROCEDURE:  EGD with biopsy, 432[address] ASA CLASS: INDICATIONS:  anemia/diarrhea-MAR 2013 HB 10.7-9.8 MICROCYTIC INDICES, FERRITIN 9-10 CR 1.58-1.55  JAN 2011: HB 11.8  MEDICATIONS:   Demerol 25 mg IV, Versed 2 mg IV TOPICAL ANESTHETIC:  Cetacaine Spray  DESCRIPTION OF PROCEDURE:     Physical exam was performed. Informed consent was obtained from the patient after explaining the benefits, risks, and alternatives to the procedure.  The patient was connected to the monitor and placed in the left lateral position.  Continuous oxygen was provided by nasal cannula and IV medicine administered through an indwelling cannula.  After administration of sedation, the patient's esophagus was intubated and the [id number] ([id number]) endoscope was advanced under direct visualization to the second portion of the duodenum.  The scope was removed slowly by carefully examining the color, texture, anatomy, and integrity of the mucosa on the way out.  The patient was recovered in endoscopy and discharged home in satisfactory condition. <<PROCEDUREIMAGES>>  Barrett's esophagus was found in the distal esophagus & BIOPSIED. Mild gastritis was found & BIOPSIED.  A SMALL diverticulum was found in the second portion of the duodenum ? INVOLVING THE AMPULLA. NL DUODENAL BULB.  COMPLICATIONS:    None  ENDOSCOPIC IMPRESSION: 1) Barrett's esophagus in the distal esophagus 2) Mild gastritis 3) Diverticulum in the second portion duodenum RECOMMENDATIONS: AWAIT BIOPSIES IF NO CELIAC SPRUE OR H PYLORI, PT NEEDS CAPSULE ENDOSCOPY CONTINUE OMP LOW FAT/HIGH FIBER DIET OPV IN 3 MOS REPEAT EGD IN 3 YEARS TCS IN 10 YEARS  REPEAT  EXAM:  No  ______________________________ Jonette Eva, MD  CC:  n. eSIGNED:   Kelsee Preslar at 11/12/2011 11:10 AM  [place], [phone]

## 2011-11-22 ENCOUNTER — Telehealth: Payer: Self-pay

## 2011-11-22 ENCOUNTER — Telehealth: Payer: Self-pay | Admitting: Gastroenterology

## 2011-11-22 NOTE — Telephone Encounter (Signed)
[name] aware of results and will call back to make follow up appointment. I am going to mail out the High fiber/Low Fat diet.

## 2011-11-22 NOTE — Telephone Encounter (Signed)
PLEASE CALL PT. HER LOW BLOOD COUNT IS LIKELY DUE TO CHRONIC BLOOD THINNERS CAUSING GI BLOOD LOSS AND KIDNEY DISEASE. SHE SHOULD HAVE A CAPSUL ENDOSCOPY TO COMPLETE HER GI EVALUATION, Dx: FEDA/OBSCURE GI BLEED.

## 2011-11-22 NOTE — Telephone Encounter (Signed)
[name] talked to Pt and informed her that we are going to more test. See phone note

## 2011-11-22 NOTE — Telephone Encounter (Signed)
Pt aware that capsule endoscopy will be scheduled.

## 2011-11-22 NOTE — Telephone Encounter (Signed)
Pt would like to know what is causing her to be anemia. Please advise

## 2011-11-22 NOTE — Telephone Encounter (Signed)
Results Cc to PCP & reminders are in the computer 

## 2011-11-22 NOTE — Telephone Encounter (Signed)
PLEASE CALL PT. HER ESOPHAGEAL bX SHOW CHANGES OF REFLUX. HER STOMACH BIOPSIES SHOW GASTRITIS. HER COLON AND SMALL BOWEL BIOPSIES ARE NORMAL.  CONTINUE OMEPRAZOLE EVERY MORNING. IMODIUM AS NEEDED FOR DIARRHEA. FOLLOW A HIGH FIBER/LOW FAT DIET. AVOID ITEMS THAT CAUSE BLOATING. SEE INFO BELOW. NEXT UPPER ENDOSCOPY IN 3 YEARS. Next colonoscopy in 10 years.  OPV IN 3 MOS W/ KJ

## 2011-11-23 ENCOUNTER — Other Ambulatory Visit: Payer: Self-pay | Admitting: Gastroenterology

## 2011-11-23 ENCOUNTER — Ambulatory Visit (INDEPENDENT_AMBULATORY_CARE_PROVIDER_SITE_OTHER): Payer: Medicare Other | Admitting: *Deleted

## 2011-11-23 DIAGNOSIS — I4891 Unspecified atrial fibrillation: Secondary | ICD-10-CM

## 2011-11-23 LAB — POCT INR: INR: 3.1

## 2011-11-23 NOTE — Telephone Encounter (Signed)
[name] scheduled for June 5th at the daughter's request- instructions mailed

## 2011-11-24 NOTE — Telephone Encounter (Signed)
Reminder in epic to follow up in 3 months with KJ

## 2011-11-30 IMAGING — CT CT CERVICAL SPINE W/O CM
3 of 5 series · 10 of 33 positions shown, 12 images · non-contrast
Comparison: None

CT HEAD

CLINICAL DATA: Fall.  Pain.  Scalp laceration

CT HEAD WITHOUT CONTRAST
CT CERVICAL SPINE WITHOUT CONTRAST
TECHNIQUE: Multidetector CT imaging of the head and cervical spine
was performed following the standard protocol without intravenous
contrast.  Multiplanar CT image reconstructions of the cervical
spine were also generated.

[Series 4: cervical st 2.0 b31s · axial · 0.24mm/px · z∈[+86,+162]mm · 2 of 96 slices shown, 3 images]
[im 39/96  soft-tissue]
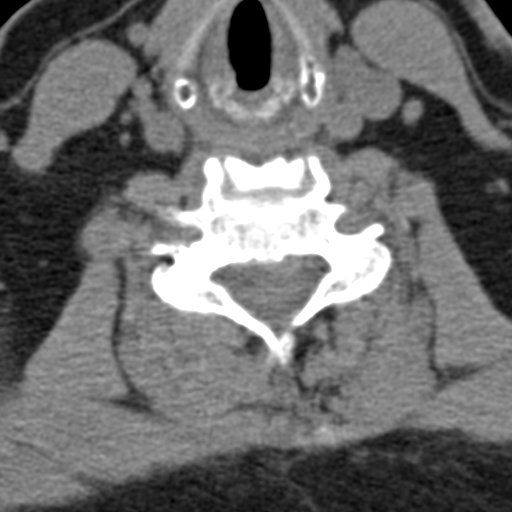
[im 39/96  bone]
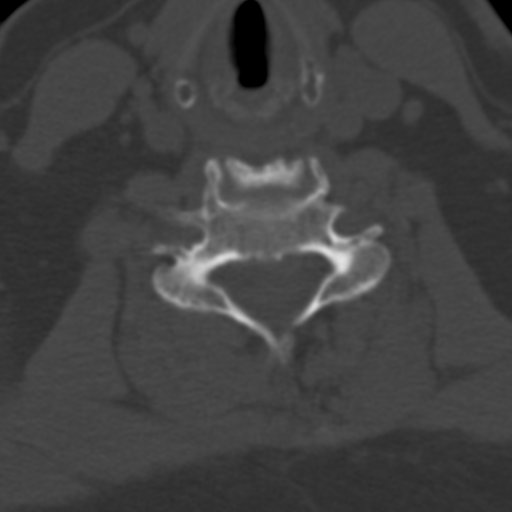
[im 77/96  bone]
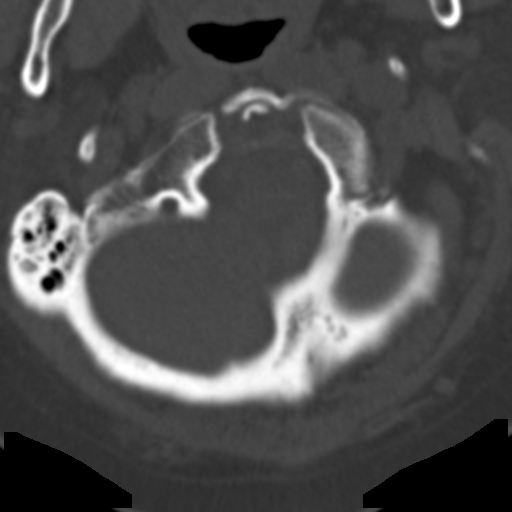

[Series 7: cervical coro (id) · coronal · 0.20mm/px · 3 of 27 slices shown]
[im 6/27  bone]
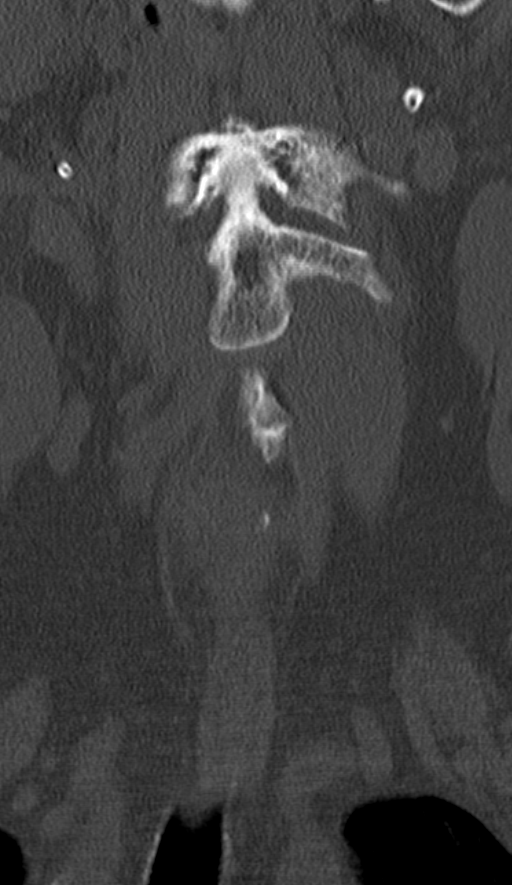
[im 11/27  bone]
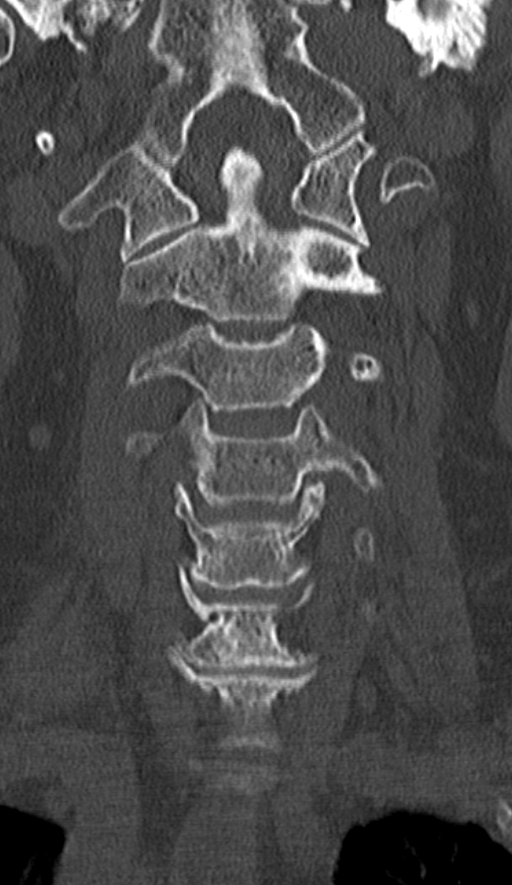
[im 16/27  bone]
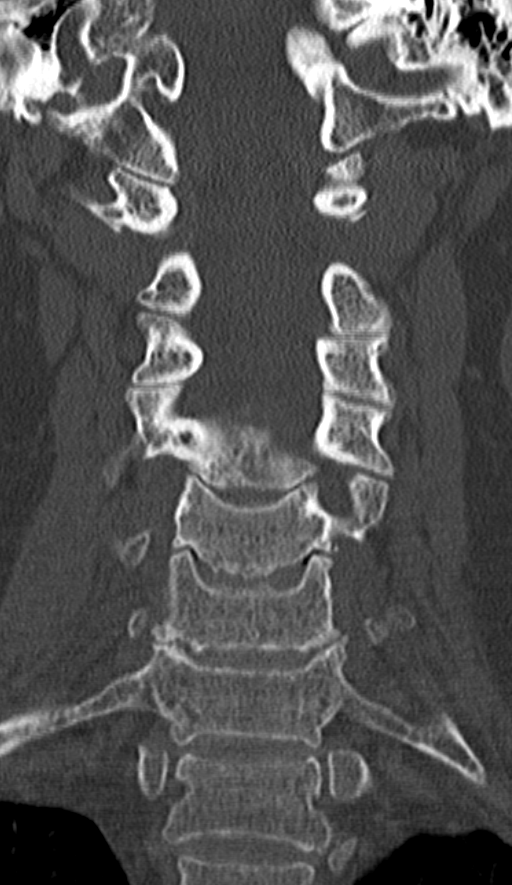

[Series 8: cervical sag (id) · sagittal · 0.18mm/px · 5 of 40 slices shown, 6 images]
[im 14/40  bone]
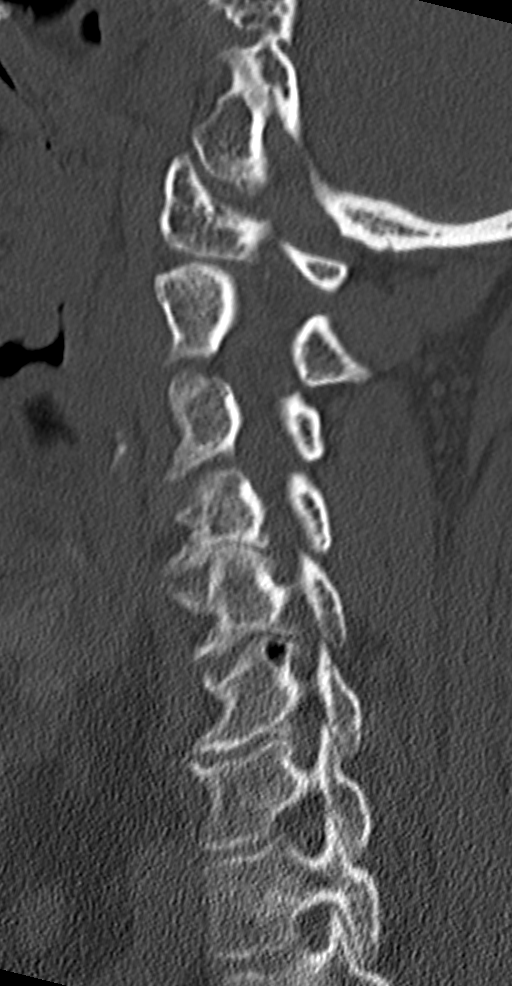
[im 17/40  bone]
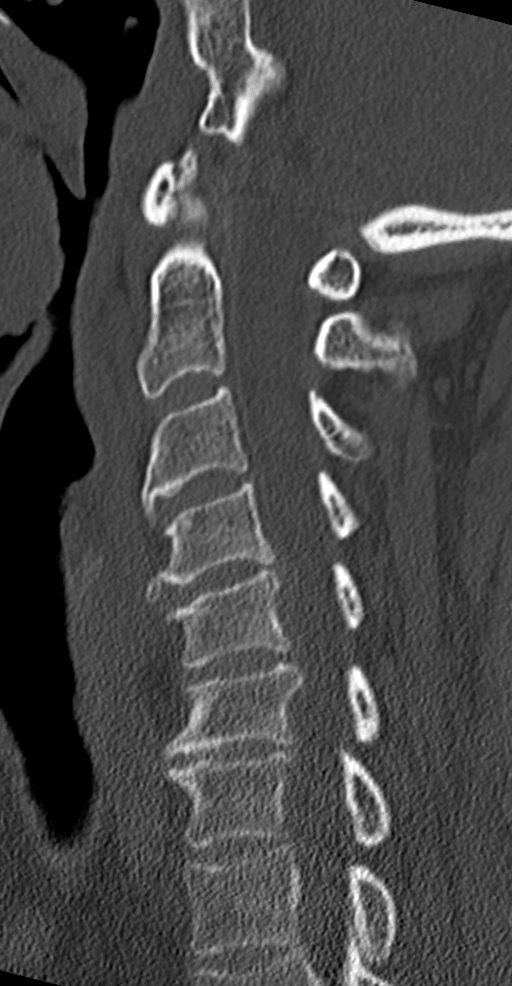
[im 20/40  soft-tissue]
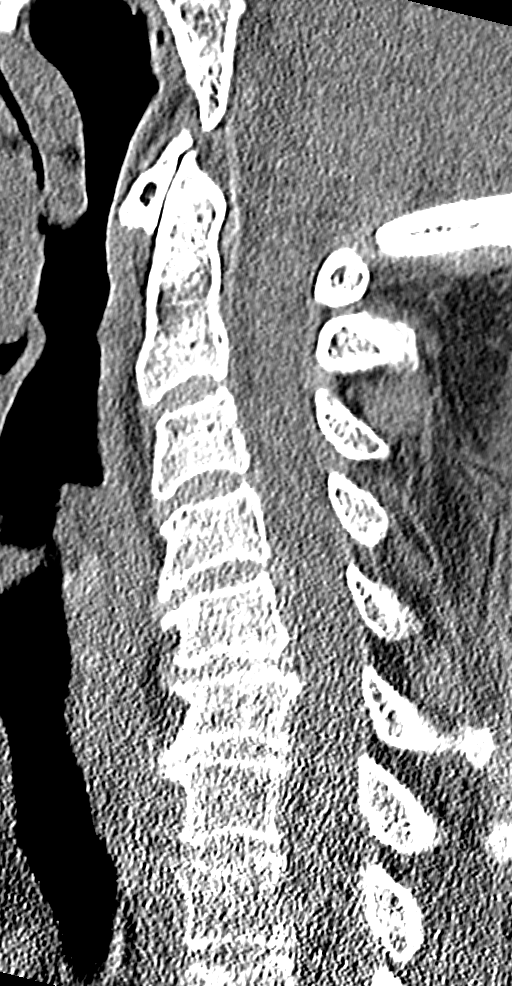
[im 20/40  bone]
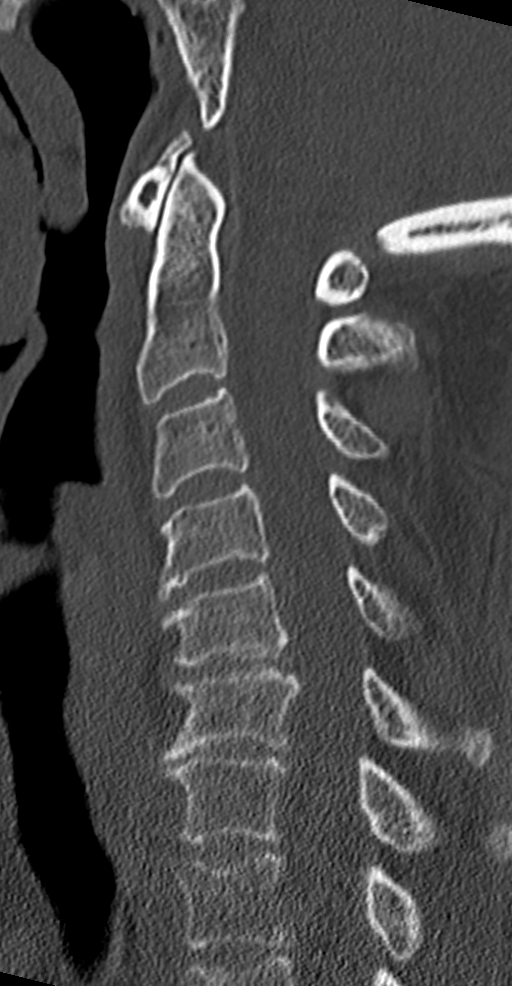
[im 23/40  bone]
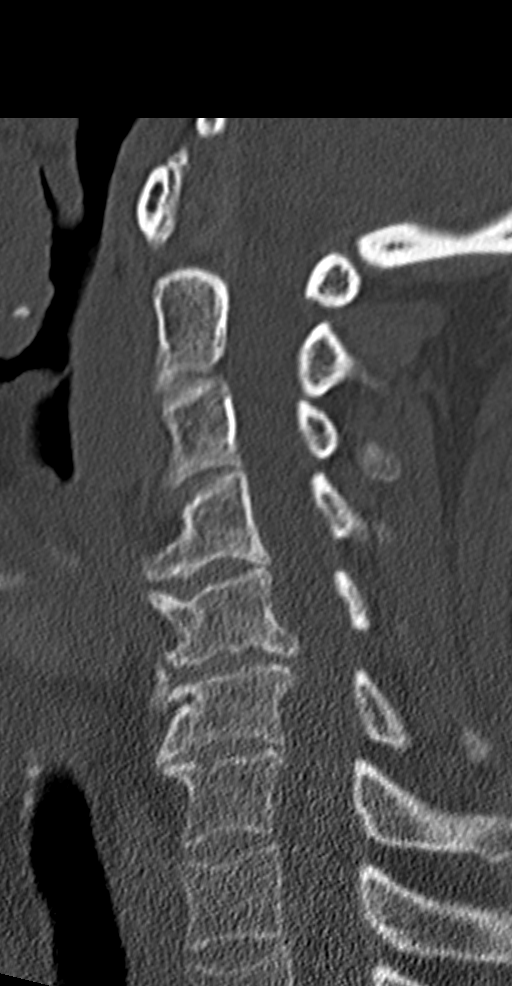
[im 27/40  bone]
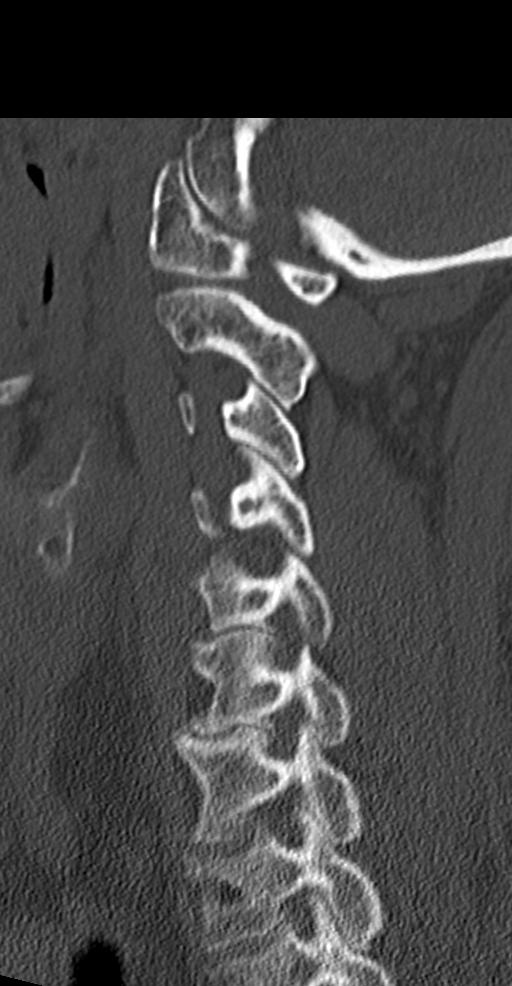

[10 of 33 positions shown; findings below may reference images not displayed]

FINDINGS: The brain has a normal appearance without evidence for
hemorrhage, infarction, hydrocephalus, or mass lesion.

There is no extra axial fluid collection.

The skull and paranasal sinuses are normal.

Mucoperiosteal thickening involves the right side of the sphenoid
sinus.

The skull is intact.

There is subcutaneous emphysema within the right posterior scalp,
image number 33.
IMPRESSION: 1.  No acute intracranial abnormalities.
2.  Right posterior scalp laceration.
3.  Chronic sinus inflammation involves the sphenoid sinus.

CT CERVICAL SPINE
FINDINGS: There is straightening of normal cervical lordosis.

The vertebral body heights are well preserved.

There is a prevertebral soft tissue space is within normal limits.

There is multilevel disc space narrowing with anterior spur
formation.

There are no fractures identified.

The facet joints appear well aligned.
IMPRESSION: 1.  No acute findings.
2.  Cervical spondylosis.

## 2011-11-30 IMAGING — CR DG CHEST 2V
2 series · 2 of 2 positions shown · non-contrast
Comparison: 05/30/2009

CLINICAL DATA: Pain.  Status post fall

CHEST - 2 VIEW

[view not recorded (1 of 2)]
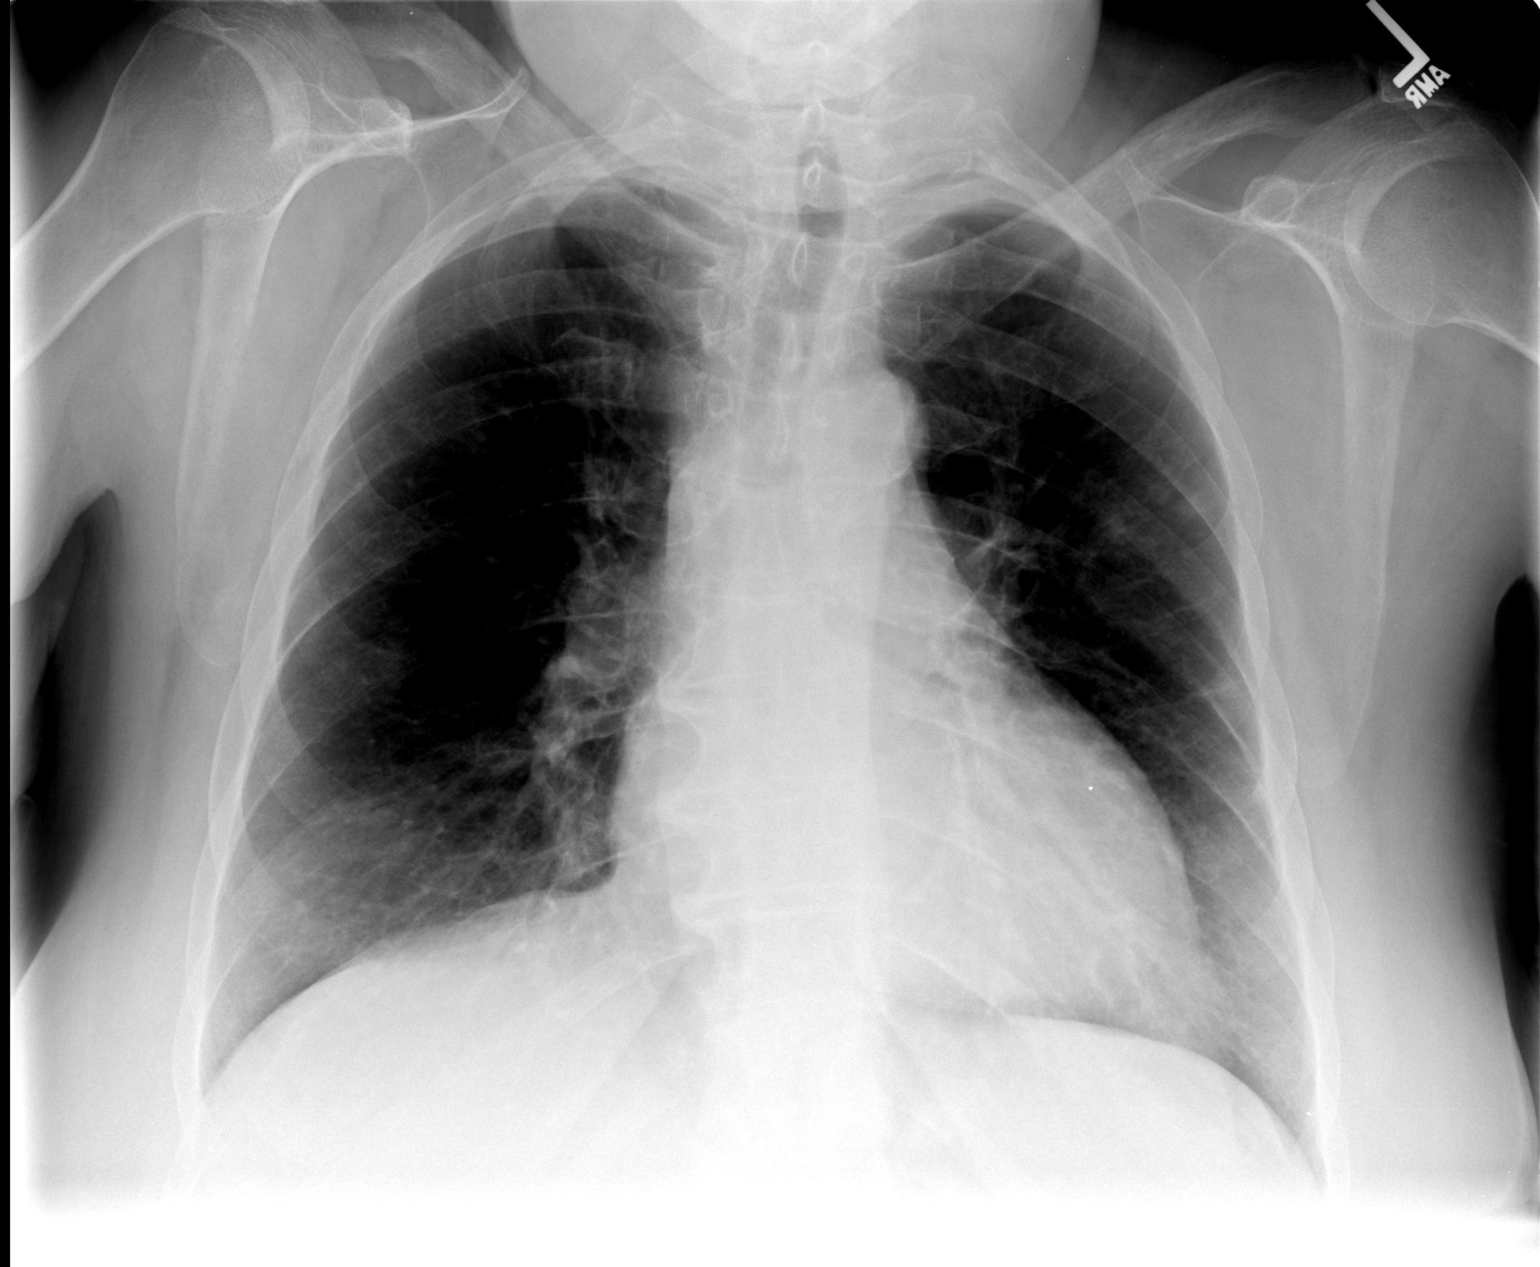

[view not recorded (2 of 2)]
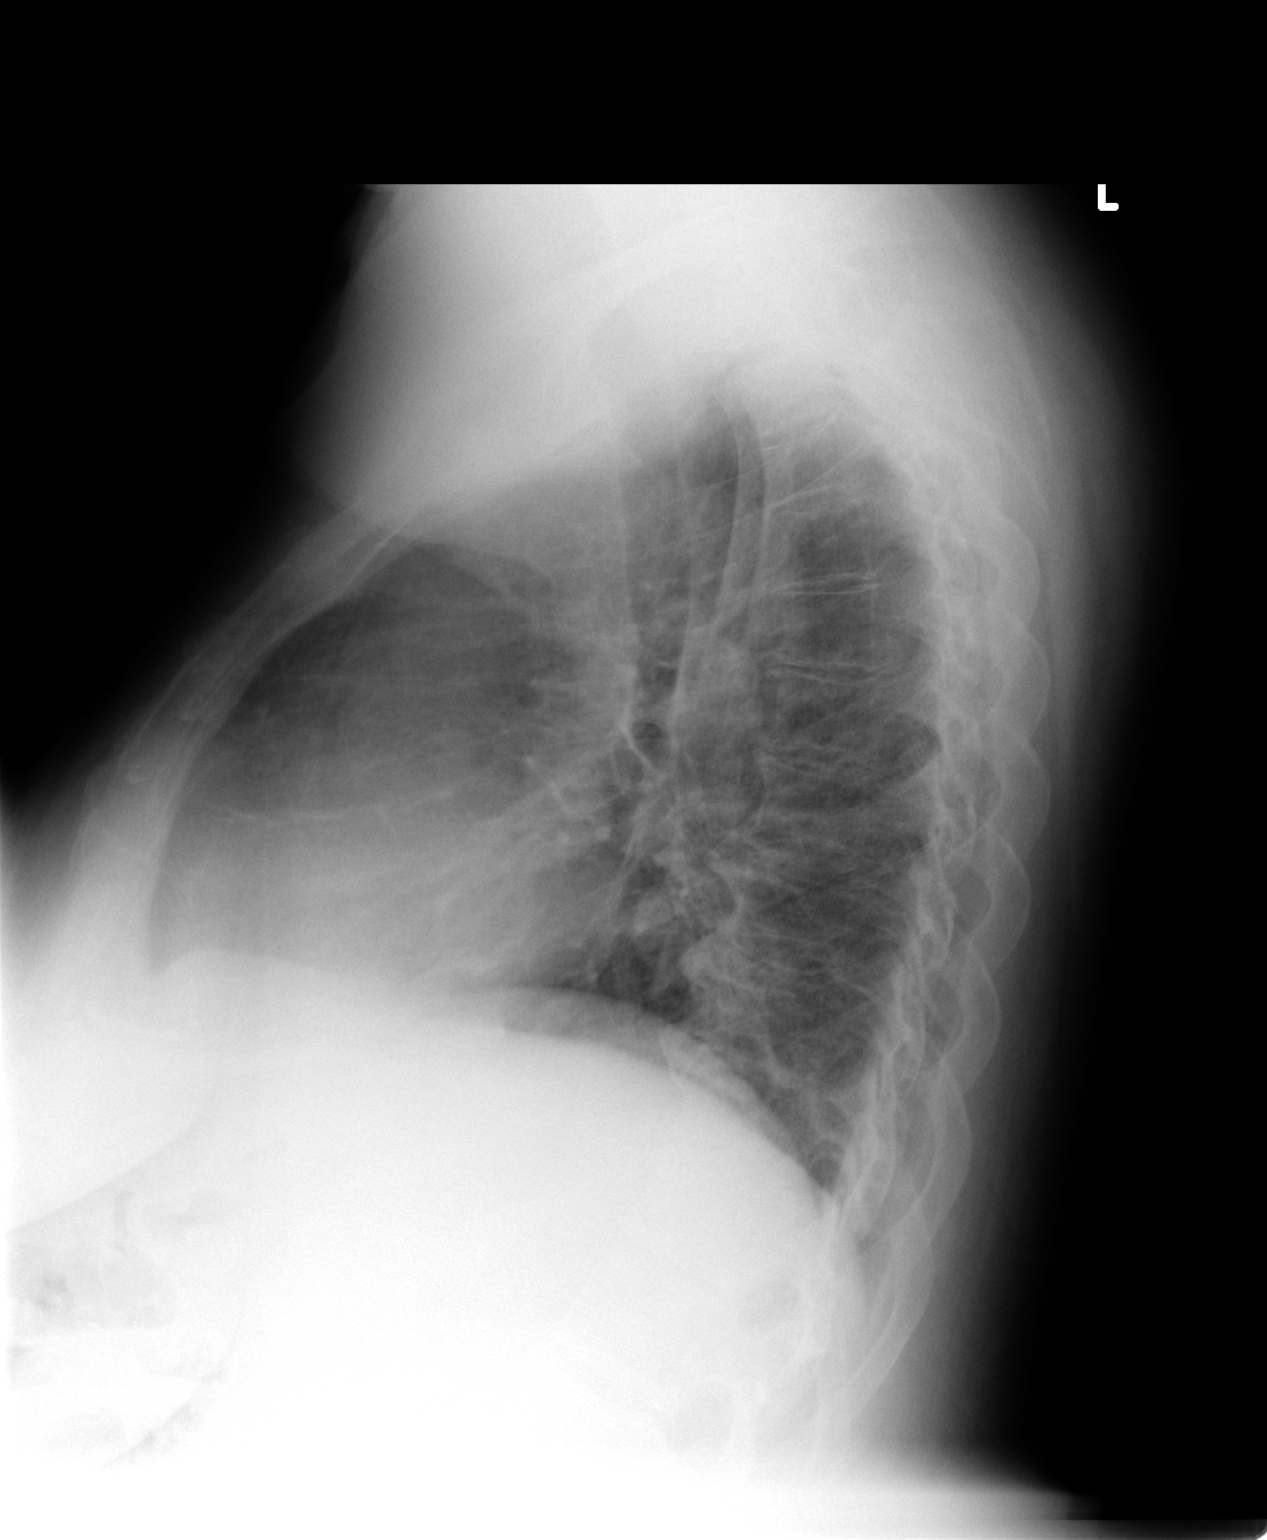

[2 of 2 positions shown; findings below may reference images not displayed]

FINDINGS: Mild cardiac enlargement.

No pleural effusion or pulmonary edema noted.

Faint nodular densities in the left upper lobe appear new from
prior exam.

Review of the visualized osseous structures shows no acute bony
abnormality.
IMPRESSION: 1.  Left upper lobe densities are nonspecific and may represent
early infiltrate.  Follow-up imaging recommended to ensure
resolution.
2.  Examination is otherwise unremarkable.

## 2011-12-02 ENCOUNTER — Encounter (HOSPITAL_COMMUNITY): Payer: Self-pay | Admitting: Pharmacy Technician

## 2011-12-07 ENCOUNTER — Encounter (HOSPITAL_COMMUNITY)
Admission: RE | Disposition: A | Discharge status: Home / Self Care | Payer: Self-pay | Source: Ambulatory Visit | Attending: Gastroenterology

## 2011-12-07 ENCOUNTER — Ambulatory Visit (HOSPITAL_COMMUNITY)
Admission: RE | Admit: 2011-12-07 | Discharge: 2011-12-07 | Disposition: A | Discharge status: Home / Self Care | Payer: Medicare Other | Source: Ambulatory Visit | Attending: Gastroenterology | Admitting: Gastroenterology

## 2011-12-07 ENCOUNTER — Encounter (HOSPITAL_COMMUNITY): Payer: Self-pay | Admitting: *Deleted

## 2011-12-07 DIAGNOSIS — E119 Type 2 diabetes mellitus without complications: Secondary | ICD-10-CM | POA: Insufficient documentation

## 2011-12-07 DIAGNOSIS — K267 Chronic duodenal ulcer without hemorrhage or perforation: Secondary | ICD-10-CM | POA: Insufficient documentation

## 2011-12-07 DIAGNOSIS — K922 Gastrointestinal hemorrhage, unspecified: Secondary | ICD-10-CM | POA: Insufficient documentation

## 2011-12-07 DIAGNOSIS — K31819 Angiodysplasia of stomach and duodenum without bleeding: Secondary | ICD-10-CM | POA: Insufficient documentation

## 2011-12-07 DIAGNOSIS — E785 Hyperlipidemia, unspecified: Secondary | ICD-10-CM | POA: Insufficient documentation

## 2011-12-07 DIAGNOSIS — Z01812 Encounter for preprocedural laboratory examination: Secondary | ICD-10-CM | POA: Insufficient documentation

## 2011-12-07 DIAGNOSIS — I10 Essential (primary) hypertension: Secondary | ICD-10-CM | POA: Insufficient documentation

## 2011-12-07 HISTORY — PX: GIVENS CAPSULE STUDY: SHX5432

## 2011-12-07 LAB — GLUCOSE, CAPILLARY: Glucose-Capillary: 158 mg/dL — ABNORMAL HIGH (ref 70–99)

## 2011-12-07 SURGERY — IMAGING PROCEDURE, GI TRACT, INTRALUMINAL, VIA CAPSULE

## 2011-12-08 ENCOUNTER — Other Ambulatory Visit: Payer: Self-pay

## 2011-12-08 ENCOUNTER — Telehealth: Payer: Self-pay | Admitting: Gastroenterology

## 2011-12-08 ENCOUNTER — Encounter (HOSPITAL_COMMUNITY): Payer: Self-pay | Admitting: Gastroenterology

## 2011-12-08 DIAGNOSIS — D649 Anemia, unspecified: Secondary | ICD-10-CM

## 2011-12-08 DIAGNOSIS — K922 Gastrointestinal hemorrhage, unspecified: Secondary | ICD-10-CM

## 2011-12-08 DIAGNOSIS — K5520 Angiodysplasia of colon without hemorrhage: Secondary | ICD-10-CM

## 2011-12-08 NOTE — Telephone Encounter (Signed)
CALLED [facility]. DISCUSSED CAPSULE RESULTS. CASPULE SHOWS SB AVMs AND ULCERS.  AVOID IBUPROFEN/NSAIDS. CONTINUE COUMADIN. LAST CBC MAR 2013. RECHECK CBC NEXT WEEK. OPV IN 3 MOS, DX: ANEMIA E 30.  [name] WILL COME TO FRONT DESK TO PICK UP ORDER.

## 2011-12-08 NOTE — Telephone Encounter (Signed)
Lab order at front for pick-up.

## 2011-12-08 NOTE — Brief Op Note (Signed)
12/07/2011  1:24 PM  PATIENT:  [name]  61 y.o. female  PRE-OPERATIVE DIAGNOSIS:  obscure GI Bleed  POST-OPERATIVE DIAGNOSIS:  GI BLOOD LOSS FROM Small bowel ulcers/AVMs  PROCEDURE:  Procedure(s) (LRB): GIVENS CAPSULE STUDY (N/A)  SURGEON:  Surgeon(s) and Role:    * West Bali, MD - Primary  PATIENT DATA: 236 LBS, 68 IN, WAIST: 51.5 IN, GASTRIC PASSAGE TIME: 0 HR 10 m, SB PASSAGE TIME: 3H 34m  RESULTS: LIMITED views of gastric mucosa due to retained contents. No blood in the stomach. 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 Multiple ulcers seen in THE DISTAL SMALL BOWEL: 69% of SB PROGRESS TIME.  No masses. LIMITED VIEWS OF THE COLON DUE TO RETAINED CONTENTS.  DIAGNOSIS: ANEMIA DUE TO GI BLOOD LOSS/RENAL INSUFFICIENCY  Plan: 1. DO NOT USE NSAIDS 2. CONTINUE COUMADIN. CBC WITH NEXT INR. 3. FOLLOW UP IN 4 MOS.   DISCUSSED FINDINGS AND PLAN WITH DAUGHTER.

## 2011-12-08 NOTE — Telephone Encounter (Signed)
Faxed to PCP

## 2011-12-13 NOTE — Telephone Encounter (Signed)
Reminder in epic to follow up with SF in 3 months in E30 for anemia

## 2011-12-14 ENCOUNTER — Ambulatory Visit (INDEPENDENT_AMBULATORY_CARE_PROVIDER_SITE_OTHER): Payer: Medicare Other | Admitting: *Deleted

## 2011-12-14 ENCOUNTER — Other Ambulatory Visit: Payer: Self-pay | Admitting: Gastroenterology

## 2011-12-14 DIAGNOSIS — I4891 Unspecified atrial fibrillation: Secondary | ICD-10-CM

## 2011-12-15 LAB — CBC WITH DIFFERENTIAL/PLATELET
Basophils Absolute: 0 10*3/uL (ref 0.0–0.1)
Eosinophils Absolute: 0.1 10*3/uL (ref 0.0–0.7)
Lymphs Abs: 3.2 10*3/uL (ref 0.7–4.0)
MCH: 23.1 pg — ABNORMAL LOW (ref 26.0–34.0)
Neutrophils Relative %: 43 % (ref 43–77)
Platelets: 275 10*3/uL (ref 150–400)
RBC: 4.25 MIL/uL (ref 3.87–5.11)
WBC: 7.1 10*3/uL (ref 4.0–10.5)

## 2011-12-22 ENCOUNTER — Telehealth: Payer: Self-pay | Admitting: Gastroenterology

## 2011-12-22 NOTE — Telephone Encounter (Signed)
Reminder in epic to follow up in Sept 2013

## 2011-12-22 NOTE — Telephone Encounter (Signed)
PLEASE CALL [name]. HER BLOOD COUNT IS 9.8. IT HAS BEEN 9.8 TO 10.4 SINCE MARCH 2013. OPV IN SEP 2013.

## 2011-12-22 NOTE — Telephone Encounter (Signed)
Called and informed daughter, Denise.  

## 2011-12-28 ENCOUNTER — Ambulatory Visit (INDEPENDENT_AMBULATORY_CARE_PROVIDER_SITE_OTHER): Payer: Medicare Other | Admitting: *Deleted

## 2011-12-28 DIAGNOSIS — I4891 Unspecified atrial fibrillation: Secondary | ICD-10-CM

## 2012-01-09 ENCOUNTER — Ambulatory Visit (INDEPENDENT_AMBULATORY_CARE_PROVIDER_SITE_OTHER): Payer: Medicare Other | Admitting: *Deleted

## 2012-01-09 DIAGNOSIS — I4891 Unspecified atrial fibrillation: Secondary | ICD-10-CM

## 2012-01-09 IMAGING — CR DG SHOULDER 2+V*L*
3 series · 3 of 3 positions shown · non-contrast
Comparison: None

CLINICAL DATA: Left shoulder pain extending down

LEFT SHOULDER - 2+ VIEW

[view not recorded (1 of 3)]
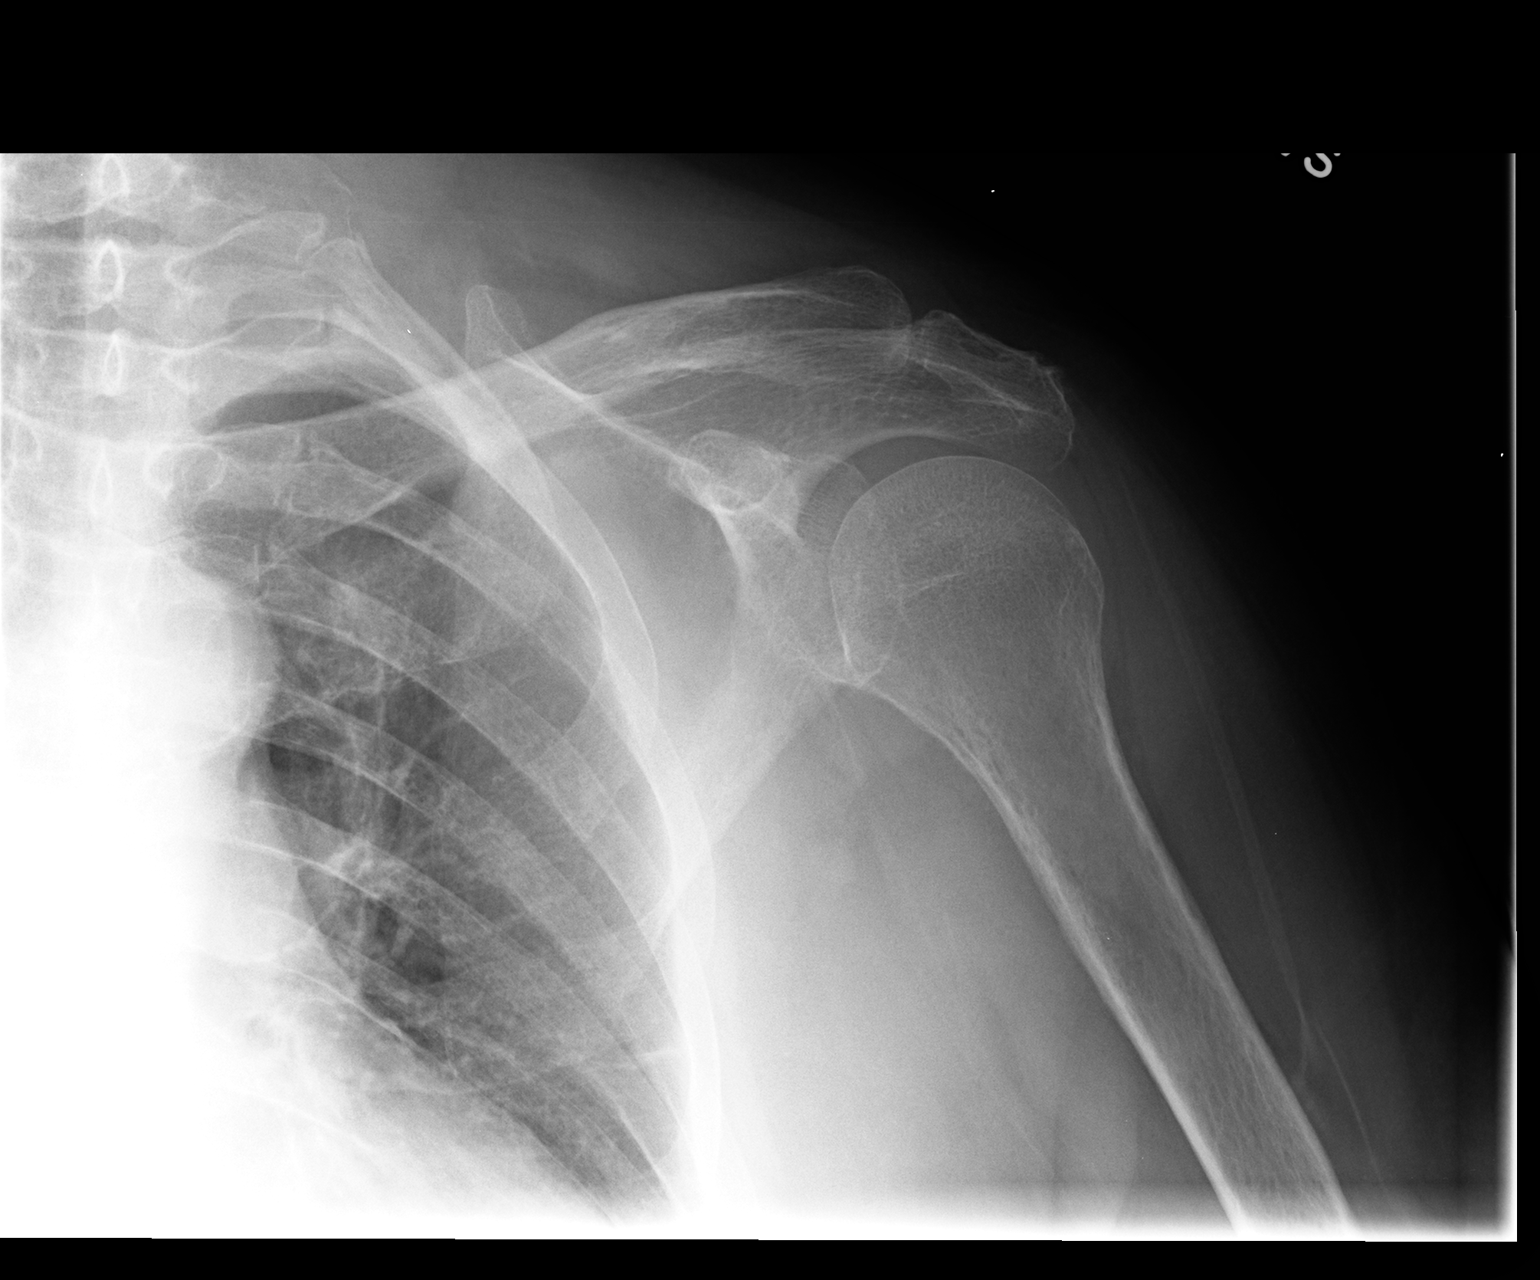

[view not recorded (2 of 3)]
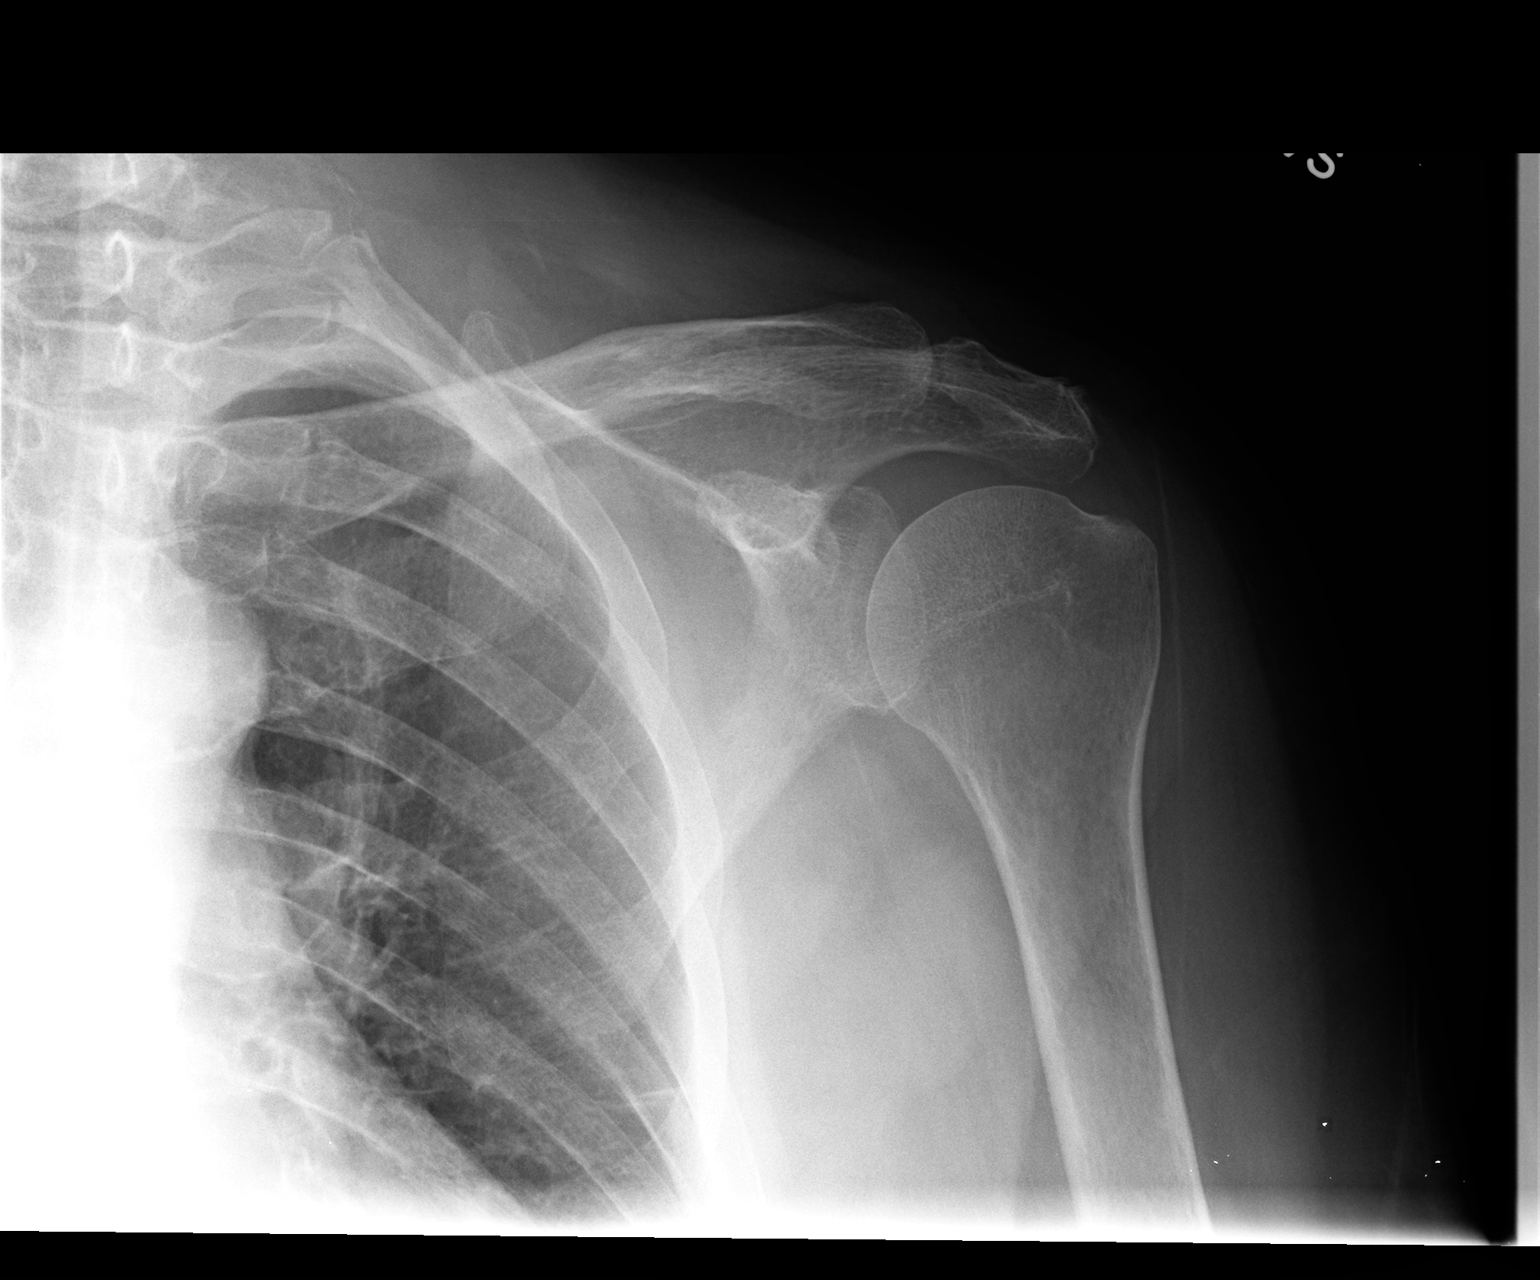

[view not recorded (3 of 3)]
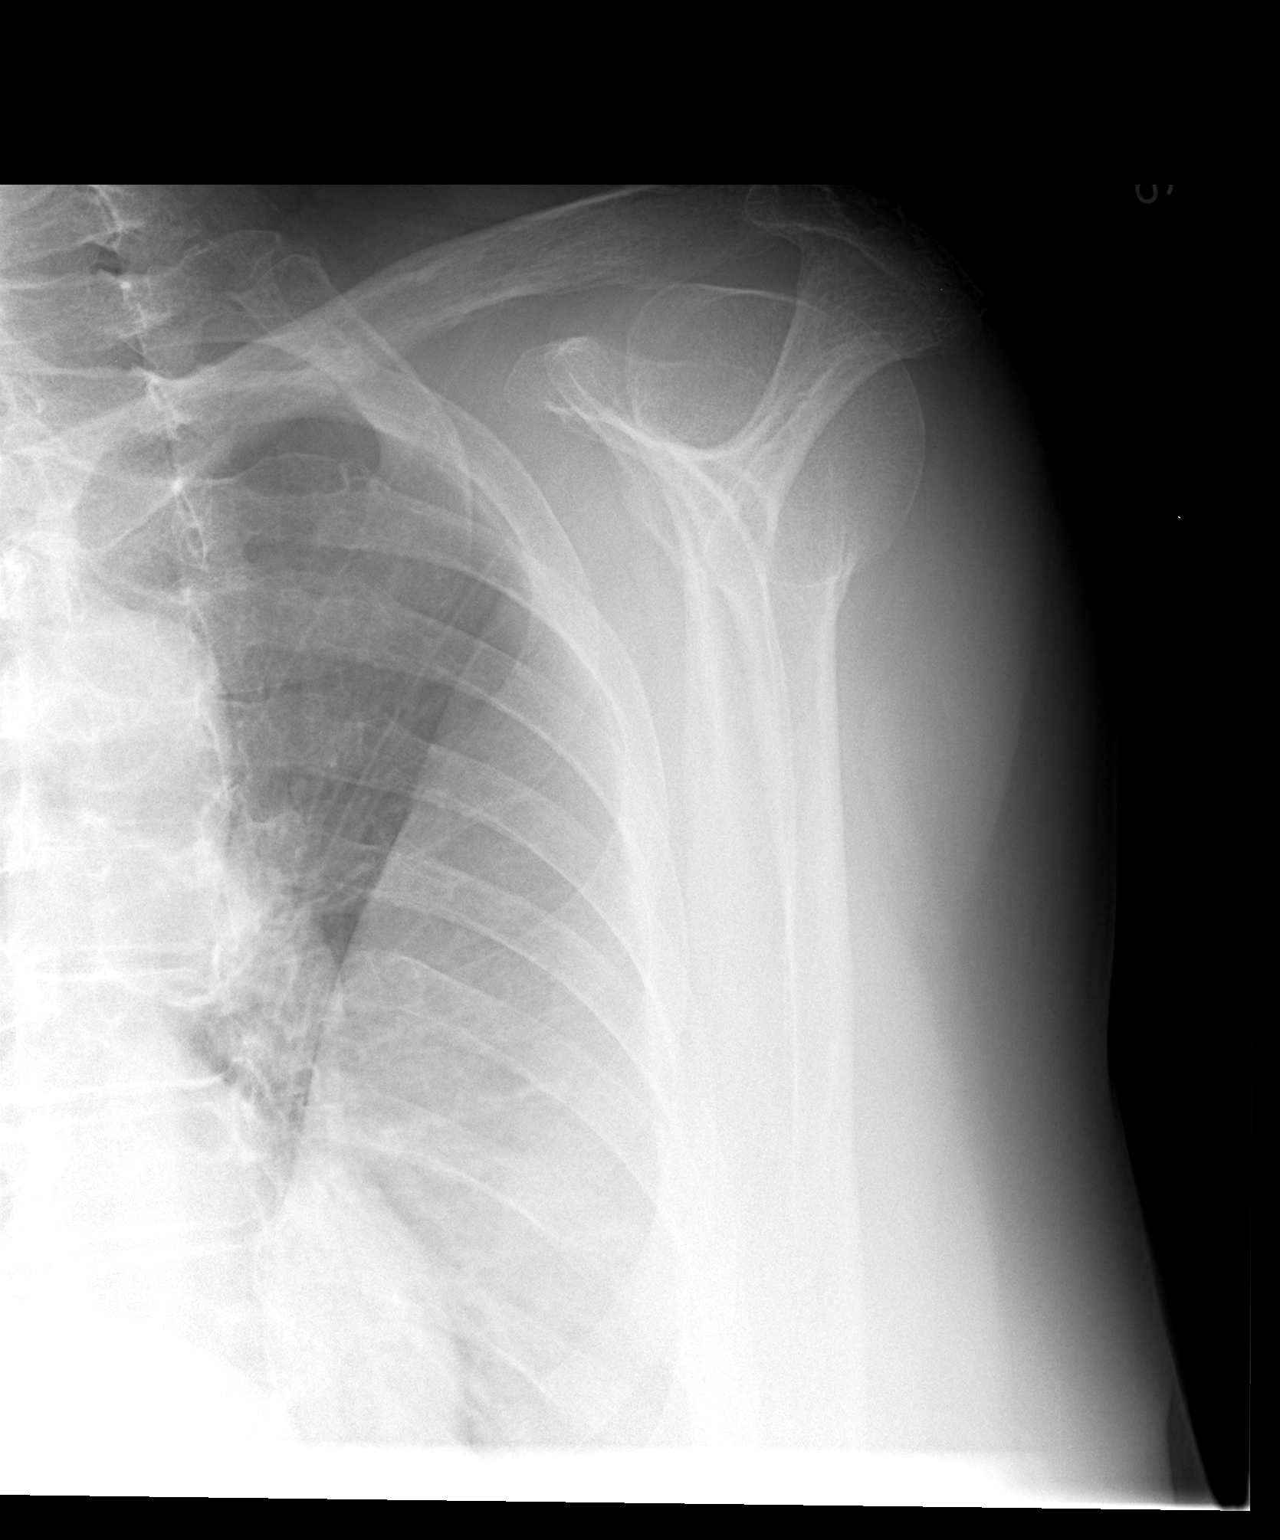

[3 of 3 positions shown; findings below may reference images not displayed]

FINDINGS: Bony demineralization.
AC joint alignment normal.
No acute fracture, dislocation, or bone destruction.
Visualized left ribs intact.
IMPRESSION: No acute bony abnormalities.
Bony demineralization.

## 2012-01-09 MED ORDER — WARFARIN SODIUM 2.5 MG PO TABS: ORAL_TABLET | ORAL | Status: DC

## 2012-01-20 ENCOUNTER — Emergency Department (HOSPITAL_COMMUNITY): Payer: Medicare Other

## 2012-01-20 ENCOUNTER — Emergency Department (HOSPITAL_COMMUNITY)
Admission: EM | Admit: 2012-01-20 | Discharge: 2012-01-20 | Disposition: A | Discharge status: Home / Self Care | Payer: Medicare Other | Attending: Emergency Medicine | Admitting: Emergency Medicine

## 2012-01-20 ENCOUNTER — Encounter (HOSPITAL_COMMUNITY): Payer: Self-pay | Admitting: *Deleted

## 2012-01-20 DIAGNOSIS — K219 Gastro-esophageal reflux disease without esophagitis: Secondary | ICD-10-CM | POA: Insufficient documentation

## 2012-01-20 DIAGNOSIS — G8929 Other chronic pain: Secondary | ICD-10-CM | POA: Insufficient documentation

## 2012-01-20 DIAGNOSIS — G4733 Obstructive sleep apnea (adult) (pediatric): Secondary | ICD-10-CM | POA: Insufficient documentation

## 2012-01-20 DIAGNOSIS — E119 Type 2 diabetes mellitus without complications: Secondary | ICD-10-CM | POA: Insufficient documentation

## 2012-01-20 DIAGNOSIS — M1711 Unilateral primary osteoarthritis, right knee: Secondary | ICD-10-CM

## 2012-01-20 DIAGNOSIS — D649 Anemia, unspecified: Secondary | ICD-10-CM | POA: Insufficient documentation

## 2012-01-20 DIAGNOSIS — I10 Essential (primary) hypertension: Secondary | ICD-10-CM | POA: Insufficient documentation

## 2012-01-20 DIAGNOSIS — E785 Hyperlipidemia, unspecified: Secondary | ICD-10-CM | POA: Insufficient documentation

## 2012-01-20 DIAGNOSIS — E039 Hypothyroidism, unspecified: Secondary | ICD-10-CM | POA: Insufficient documentation

## 2012-01-20 DIAGNOSIS — I509 Heart failure, unspecified: Secondary | ICD-10-CM | POA: Insufficient documentation

## 2012-01-20 DIAGNOSIS — Z794 Long term (current) use of insulin: Secondary | ICD-10-CM | POA: Insufficient documentation

## 2012-01-20 DIAGNOSIS — Z87891 Personal history of nicotine dependence: Secondary | ICD-10-CM | POA: Insufficient documentation

## 2012-01-20 DIAGNOSIS — M1710 Unilateral primary osteoarthritis, unspecified knee: Secondary | ICD-10-CM | POA: Insufficient documentation

## 2012-01-20 DIAGNOSIS — Z79899 Other long term (current) drug therapy: Secondary | ICD-10-CM | POA: Insufficient documentation

## 2012-01-20 MED ORDER — OXYCODONE-ACETAMINOPHEN 5-325 MG PO TABS: 1.0000 | ORAL_TABLET | ORAL | Status: AC | PRN

## 2012-01-20 MED ORDER — HYDROCODONE-ACETAMINOPHEN 5-325 MG PO TABS
1.0000 | ORAL_TABLET | Freq: Once | ORAL | Status: AC
  Administered 2012-01-20: 1 via ORAL
  Filled 2012-01-20: qty 1

## 2012-01-20 NOTE — ED Provider Notes (Addendum)
History     CSN: [id number]  Arrival date & time 01/20/12  1355   First MD Initiated Contact with Patient 01/20/12 1426      Chief Complaint  Patient presents with  . Leg Pain    (Consider location/radiation/quality/duration/timing/severity/associated sxs/prior treatment) HPI Comments: [name] presents with a 2 week history of slowly progressing right knee pain without injury that was worsened today as she was walking around a local store.  She denies any falls,  Twists or other trauma and denies swelling of the knee.  She has pain across her anterior lower knee which radiates into her posterior knee joint space.  She denies any recent fevers.  She is fairly sedentary at baseline do to multiple other medical problems. Pain is now constant yet worse with weight bearing.  The history is provided by the patient.    Past Medical History  Diagnosis Date  . Chest pain     Negative cardiac catheterization in 2002; negative stress nuclear study in 2008  . Congestive heart failure     Normal LV systolic function  . PAF (paroxysmal atrial fibrillation) 2009  . Chronic anticoagulation   . Diabetes mellitus, type 2     Insulin therapy; exacerbated by prednisone  . Syncope     Admitted 05/2009; magnetic resonance imagin/ MRA - negative; etiology thought to be orthostasis secondary to drugs and dehydration  . GERD (gastroesophageal reflux disease)   . IBS (irritable bowel syndrome)   . Hiatal hernia   . Gastroparesis     99% retention 05/2008 on GES  . Hyperlipidemia   . Hypertension   . Hypothyroid   . Chronic LBP     Surgical intervention in 1996  . Obesity   . OSA on CPAP   . Anemia     H/H of 10/30 with a normal MCV in 12/09  . Barrett's esophagus     Diagnosed 1995. Last EGD 2008.     Past Surgical History  Procedure Date  . Cardiac catheterization 2002  . Cardiovascular stress test 2008    Stress nuclear study  . Back surgery 1996  . Laparoscopic cholecystectomy  1990s  . Total abdominal hysterectomy 1999  . Laminectomy 1995    L4-L5  . Carpal tunnel release 1994  . Esophagogastroduodenoscopy 2008    Barrett's without dysplasia. esphagus dilated. antral erosions, h.pylori serologies negative.  . Colonoscopy     Patient reports normal TCS around 10 years ago.  [employer] capsule study 12/07/2011    Procedure: GIVENS CAPSULE STUDY;  Surgeon: West Bali, MD;  Location: [facility];  Service: Endoscopy;  Laterality: N/A;    Family History  Problem Relation Age of Onset  . Hypertension Mother   . Alzheimer's disease Mother   . Stroke Mother   . Heart attack Mother   . Heart disease Neg Hx   . Hypertension Other   . Colon cancer Neg Hx     History  Substance Use Topics  . Smoking status: Former Games developer  . Smokeless tobacco: Former Neurosurgeon   Comment: Remote  . Alcohol Use: No     last etoh one year ago, never frequent    OB History    Grav Para Term Preterm Abortions TAB SAB Ect Mult Living                  Review of Systems  Musculoskeletal: Positive for joint swelling and arthralgias.  Skin: Negative for wound.  Neurological: Negative  for weakness and numbness.    Allergies  Codeine  Home Medications   Current Outpatient Rx  Name Route Sig Dispense Refill  . ACETAMINOPHEN 500 MG PO TABS Oral Take 1,000 mg by mouth every 6 (six) hours as needed. Pain    . CITALOPRAM HYDROBROMIDE 20 MG PO TABS Oral Take 20 mg by mouth every morning.     Marland Kitchen DILTIAZEM HCL 120 MG PO TABS Oral Take 1 tablet (120 mg total) by mouth 2 (two) times daily. 60 tablet 12  . FUROSEMIDE 40 MG PO TABS Oral Take 40 mg by mouth daily as needed. Swelling    . GLIPIZIDE 10 MG PO TABS Oral Take 10 mg by mouth daily.     . INSULIN GLARGINE 100 UNIT/ML [place] SOLN Subcutaneous Inject 50 Units into the skin at bedtime.     Marland Kitchen LEVOTHYROXINE SODIUM 175 MCG PO TABS Oral Take 175 mcg by mouth every morning.    Marland Kitchen LORAZEPAM 1 MG PO TABS Oral Take 1 mg by mouth 3 (three) times  daily as needed. For anxiety    . METAXALONE 800 MG PO TABS Oral Take 800 mg by mouth 3 (three) times daily.     Marland Kitchen METFORMIN HCL 500 MG PO TABS Oral Take 1,000 mg by mouth 2 (two) times daily with a meal.    . ADULT MULTIVITAMIN W/MINERALS CH Oral Take 1 tablet by mouth every morning.    Marland Kitchen OMEPRAZOLE 20 MG PO CPDR Oral Take 20 mg by mouth 2 (two) times daily.      Marland Kitchen ONDANSETRON HCL 8 MG PO TABS Oral Take 8 mg by mouth as needed. nausea    . POTASSIUM CHLORIDE CRYS ER 20 MEQ PO TBCR Oral Take 20 mEq by mouth every morning.     Marland Kitchen SIMVASTATIN 40 MG PO TABS Oral Take 40 mg by mouth at bedtime.      Barron Alvine SODIUM 2.5 MG PO TABS  On Mon, Wed, Friday take 1.25 mg and on all other days take 2.5 mg.    . NITROGLYCERIN 0.4 MG SL SUBL Sublingual Place 0.4 mg under the tongue as needed. For chest pain     . OXYCODONE-ACETAMINOPHEN 5-325 MG PO TABS Oral Take 1 tablet by mouth every 4 (four) hours as needed for pain. 20 tablet 0    BP 107/47  Pulse 62  Temp 98.7 F (37.1 C) (Oral)  Resp 20  Ht 5\' 8"  (1.727 m)  Wt 234 lb (106.142 kg)  BMI 35.58 kg/m2  SpO2 97%  Physical Exam  Constitutional: She appears well-developed and well-nourished.  HENT:  Head: Atraumatic.  Neck: Normal range of motion.  Cardiovascular:       Pulses equal bilaterally  Musculoskeletal: She exhibits tenderness. She exhibits no edema.       Right knee: She exhibits decreased range of motion. She exhibits no deformity, normal alignment, no LCL laxity, normal patellar mobility, normal meniscus and no MCL laxity. tenderness found.       TTP along anterior tibial plateau.  Neurological: She is alert. She has normal strength. She displays normal reflexes. No sensory deficit.       Equal strength  Skin: Skin is warm and dry.  Psychiatric: She has a normal mood and affect.    ED Course  Procedures (including critical care time)  Labs Reviewed - No data to display Dg Knee Complete 4 Views Right  01/20/2012  *RADIOLOGY  REPORT*  Clinical Data: Right knee pain  RIGHT  KNEE - COMPLETE 4+ VIEW  Comparison: 02/08/2006  Findings: Mild tricompartmental degenerative changes.  No fracture or dislocation is seen.  No definite suprapatellar knee joint effusion.  IMPRESSION: Mild tricompartmental degenerative changes.  No fracture or dislocation is seen.  Original Report Authenticated By: Charline Bills, M.D.     1. Degenerative joint disease of right knee       MDM  Hydrocodone given in ed with moderate relief of pain.  Will prescribe percocet for better relief.  Ice,  Elevation,  Ace wrap supplied.  Walker prn. Rest.  Recheck by Dr. Romeo Apple if not improving over the next week.        Burgess Amor, PA 01/20/12 1724  Burgess Amor, PA 02/14/12 [phone]

## 2012-01-20 NOTE — ED Notes (Signed)
Pain lt lower leg for 2 weeks , worse today, Increased pain with wt bearing.  No known injury

## 2012-01-24 NOTE — ED Provider Notes (Signed)
Medical screening examination/treatment/procedure(s) were performed by non-physician practitioner and as supervising physician I was immediately available for consultation/collaboration.   Shelda Jakes, MD 01/24/12 2149

## 2012-01-30 ENCOUNTER — Ambulatory Visit (INDEPENDENT_AMBULATORY_CARE_PROVIDER_SITE_OTHER): Payer: Medicare Other | Admitting: *Deleted

## 2012-01-30 DIAGNOSIS — I4891 Unspecified atrial fibrillation: Secondary | ICD-10-CM

## 2012-02-03 IMAGING — CR DG CERVICAL SPINE COMPLETE 4+V
6 series · 6 of 6 positions shown · non-contrast
Comparison: None.

CLINICAL DATA: Cevicalgia.  Posterior neck pain radiating into left
arm.

CERVICAL SPINE - COMPLETE 4+ VIEW

[view not recorded (1 of 6)]
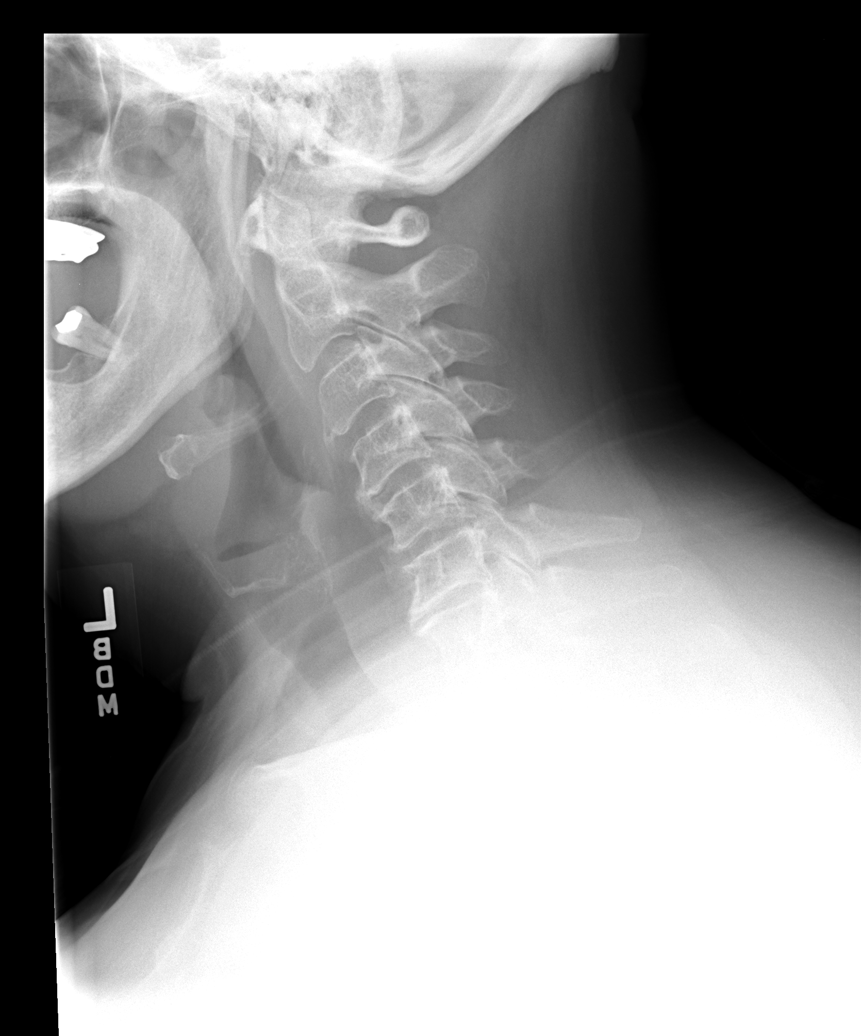

[view not recorded (2 of 6)]
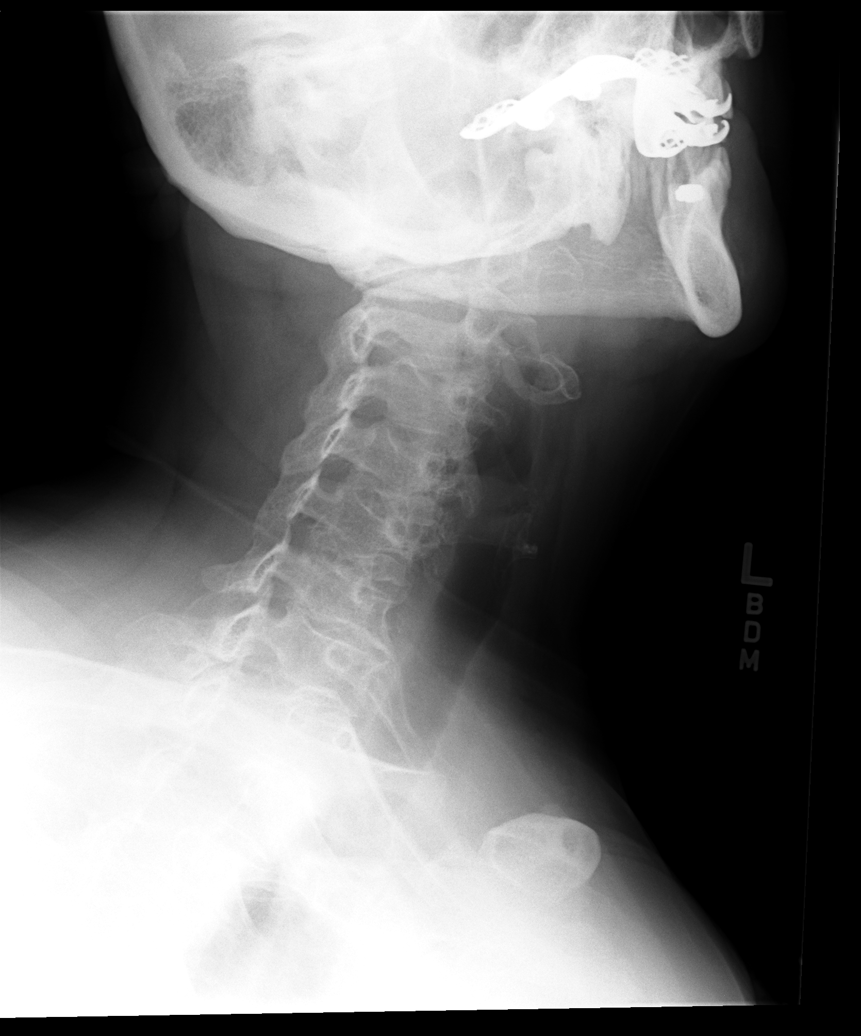

[view not recorded (3 of 6)]
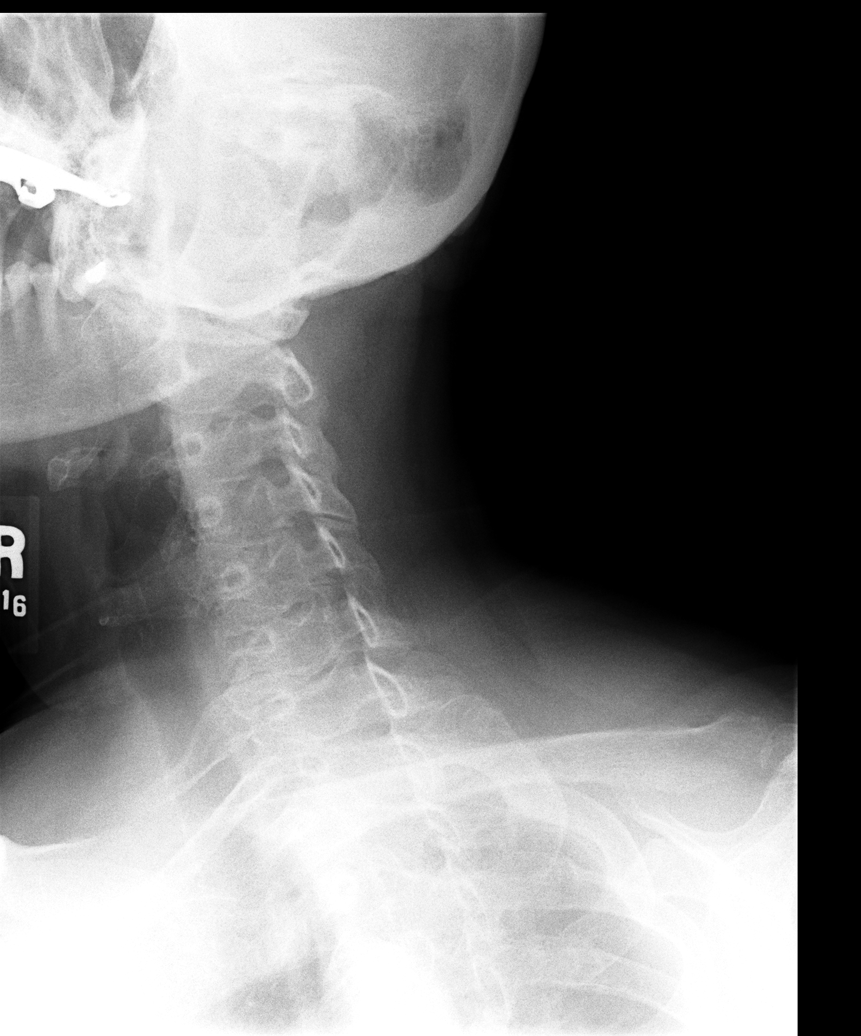

[view not recorded (4 of 6)]
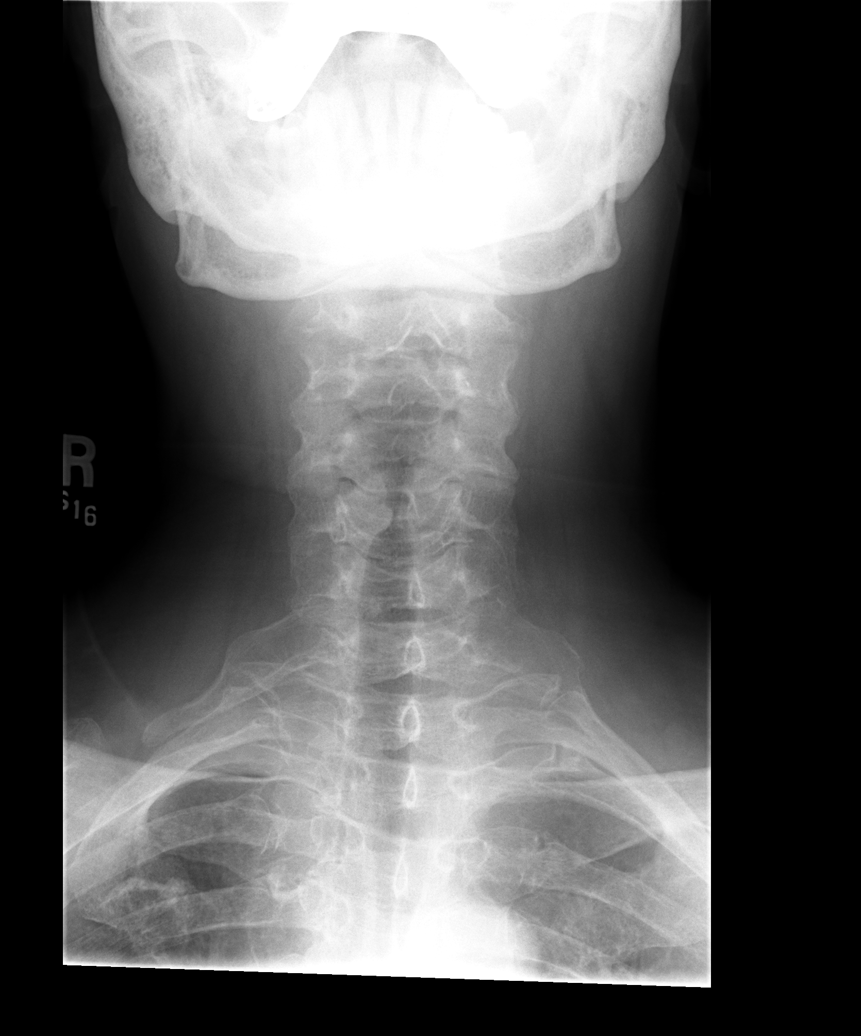

[view not recorded (5 of 6)]
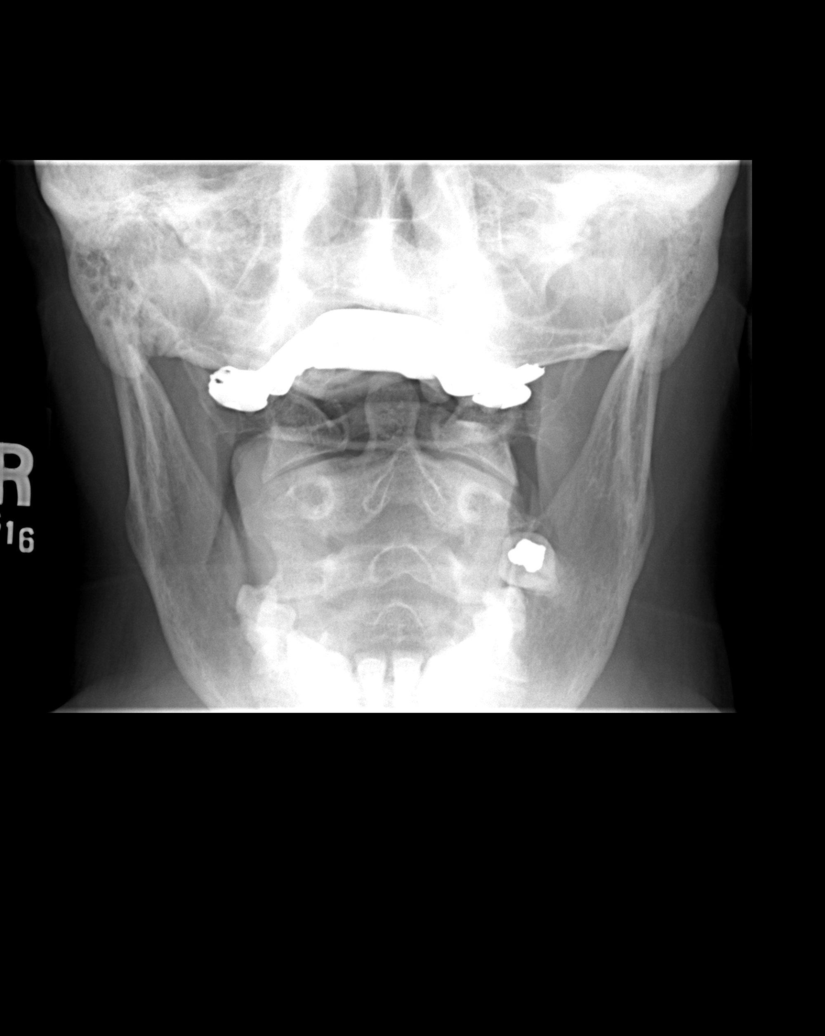

[view not recorded (6 of 6)]
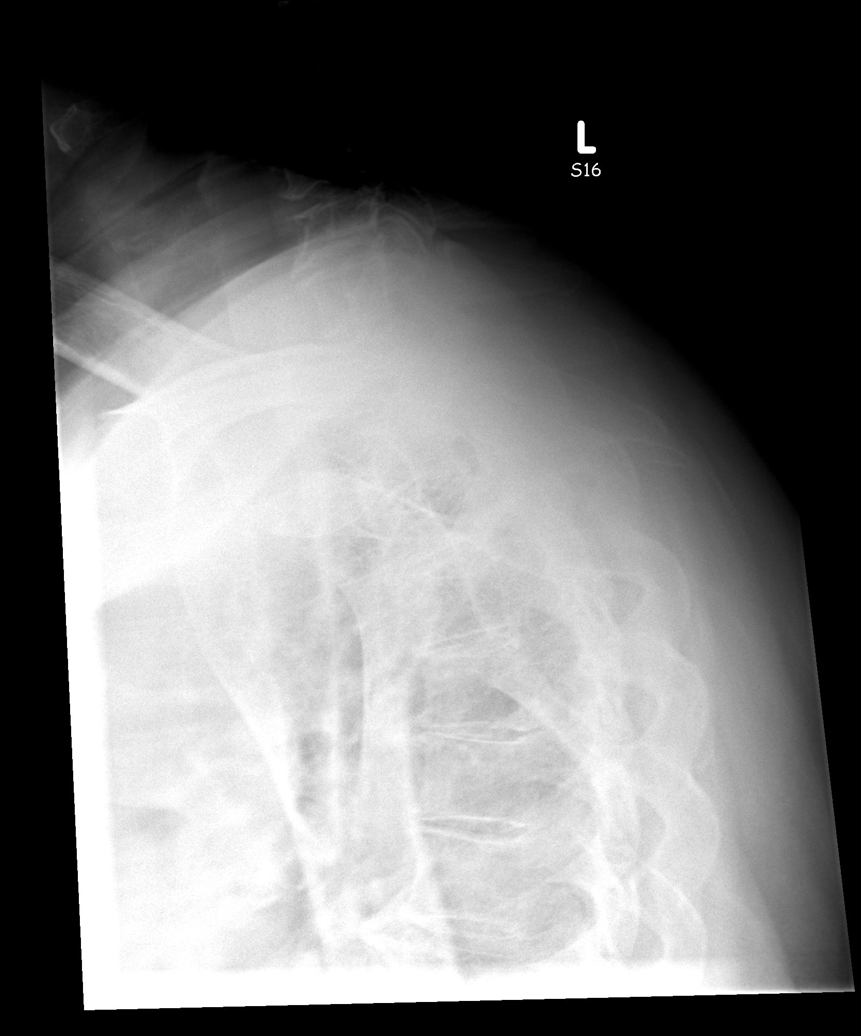

[6 of 6 positions shown; findings below may reference images not displayed]

FINDINGS: Disc space narrowing osteophytic formation at C6-7.
Osteophytic formation is also present at the C4-5 and C5-6.  No
subluxation.  Slight osteophytic encroachment on the neural
foramina at C5-6 and C6-7 on the right and to a greater degree on
the left. The sagittal dimension of bony central canal appears
adequate.  There is loss of normal lordotic curvature with
straightening.
IMPRESSION: Degenerative cervical spondylosis with foraminal stenotic changes
particularly on the left at C5-6 and C6-7.

## 2012-02-13 ENCOUNTER — Ambulatory Visit (INDEPENDENT_AMBULATORY_CARE_PROVIDER_SITE_OTHER): Payer: Medicare Other | Admitting: *Deleted

## 2012-02-13 DIAGNOSIS — I4891 Unspecified atrial fibrillation: Secondary | ICD-10-CM

## 2012-02-14 NOTE — ED Provider Notes (Signed)
Medical screening examination/treatment/procedure(s) were performed by non-physician practitioner and as supervising physician I was immediately available for consultation/collaboration.  Shelda Jakes, MD 02/14/12 [phone]

## 2012-02-15 ENCOUNTER — Encounter: Payer: Self-pay | Admitting: Gastroenterology

## 2012-03-07 ENCOUNTER — Ambulatory Visit (INDEPENDENT_AMBULATORY_CARE_PROVIDER_SITE_OTHER): Payer: Medicare Other | Admitting: *Deleted

## 2012-03-07 DIAGNOSIS — I4891 Unspecified atrial fibrillation: Secondary | ICD-10-CM

## 2012-03-12 ENCOUNTER — Encounter: Payer: Self-pay | Admitting: Gastroenterology

## 2012-03-21 ENCOUNTER — Ambulatory Visit: Payer: Medicare Other | Admitting: Gastroenterology

## 2012-03-27 ENCOUNTER — Telehealth: Payer: Self-pay | Admitting: Cardiology

## 2012-04-02 ENCOUNTER — Ambulatory Visit (INDEPENDENT_AMBULATORY_CARE_PROVIDER_SITE_OTHER): Payer: Medicare Other | Admitting: *Deleted

## 2012-04-02 DIAGNOSIS — I4891 Unspecified atrial fibrillation: Secondary | ICD-10-CM

## 2012-04-03 NOTE — Telephone Encounter (Signed)
Erroneous encounter

## 2012-04-04 ENCOUNTER — Other Ambulatory Visit: Payer: Self-pay

## 2012-04-04 ENCOUNTER — Encounter: Payer: Self-pay | Admitting: Gastroenterology

## 2012-04-04 ENCOUNTER — Ambulatory Visit (INDEPENDENT_AMBULATORY_CARE_PROVIDER_SITE_OTHER): Payer: Medicare Other | Admitting: Gastroenterology

## 2012-04-04 VITALS — BP 108/69 | HR 70 | Temp 98.4°F | Ht 68.0 in | Wt 231.4 lb

## 2012-04-04 DIAGNOSIS — K3184 Gastroparesis: Secondary | ICD-10-CM

## 2012-04-04 DIAGNOSIS — K219 Gastro-esophageal reflux disease without esophagitis: Secondary | ICD-10-CM

## 2012-04-04 DIAGNOSIS — D649 Anemia, unspecified: Secondary | ICD-10-CM

## 2012-04-04 DIAGNOSIS — K589 Irritable bowel syndrome without diarrhea: Secondary | ICD-10-CM

## 2012-04-04 DIAGNOSIS — K2270 Barrett's esophagus without dysplasia: Secondary | ICD-10-CM

## 2012-04-04 DIAGNOSIS — D509 Iron deficiency anemia, unspecified: Secondary | ICD-10-CM

## 2012-04-04 LAB — CBC WITH DIFFERENTIAL/PLATELET
Basophils Absolute: 0 10*3/uL (ref 0.0–0.1)
Eosinophils Relative: 2 % (ref 0–5)
HCT: 35 % — ABNORMAL LOW (ref 36.0–46.0)
Hemoglobin: 10.5 g/dL — ABNORMAL LOW (ref 12.0–15.0)
Lymphocytes Relative: 33 % (ref 12–46)
MCV: 80.1 fL (ref 78.0–100.0)
Monocytes Absolute: 0.7 10*3/uL (ref 0.1–1.0)
Monocytes Relative: 10 % (ref 3–12)
RDW: 20 % — ABNORMAL HIGH (ref 11.5–15.5)
WBC: 6.6 10*3/uL (ref 4.0–10.5)

## 2012-04-04 LAB — BASIC METABOLIC PANEL
CO2: 21 mEq/L (ref 19–32)
Glucose, Bld: 290 mg/dL — ABNORMAL HIGH (ref 70–99)
Potassium: 5 mEq/L (ref 3.5–5.3)
Sodium: 138 mEq/L (ref 135–145)

## 2012-04-04 NOTE — Assessment & Plan Note (Signed)
SX CONTROLLED. WITH DIET   

## 2012-04-04 NOTE — Assessment & Plan Note (Signed)
SX CONTROLLED WITH OMP.

## 2012-04-04 NOTE — Assessment & Plan Note (Signed)
SX FAIRLY WELL CONTROLLED.  TAKE IMODIUM MON WED FRI. OPV IN 6 MOS.

## 2012-04-04 NOTE — Assessment & Plan Note (Addendum)
DUE TO SMALL BOWEL AVMs IN THE SETTING OF CRI  Cbc/FERRITIN/BMP today. MAY BENEFIT FROM IRON QD. OPV IN 6 MOS.

## 2012-04-04 NOTE — Patient Instructions (Signed)
GET YOUR LABS DRAWN TODAY. I WILL CHECK YOUR BLOOD COUNT, KIDNEY FUNCTION, AND IRON STORES.  CONTINUE OMEPRAZOLE DAILY.  I WILL CALL YOU WITH YOUR RESULTS.  FOLLOW UP IN 6 MOS. MERRY CHRISTMAS AND HAPPY NEW YEAR!

## 2012-04-04 NOTE — Assessment & Plan Note (Signed)
IRREGULAR Z LINE ON EGD APR 2013. UNABLE TO APPRECIATE COLUMNAR EPITHELIUM EXTENDING ABOVE THE JXN.

## 2012-04-04 NOTE — Progress Notes (Signed)
Faxed to PCP

## 2012-04-04 NOTE — Progress Notes (Signed)
  Subjective:    Patient ID: [name], female    DOB: 1951-03-10, 61 y.o.   MRN: [id number]  PCP: [facility]  HPI Episode of diarrhea 3x/week. Uses imodium and it helps. HAS QUESTIONS ABOUT HER ULCERS. [place]. NO MELENA OR BRBPR. ENERGY LEVEL: OK. FEELS HEART FLUTTERING SOMETIMES. NO CP, DOE, LIGHT HEADED, DIZZINESS, OR SOB. NO NAUSEA, VOMITING, OR PROBLEMS SWALLOWING. NO HEARTBURN. NO IRON PO.  Past Medical History  Diagnosis Date  . Chest pain     Negative cardiac catheterization in 2002; negative stress nuclear study in 2008  . Congestive heart failure     Normal LV systolic function  . PAF (paroxysmal atrial fibrillation) 2009  . Chronic anticoagulation   . Diabetes mellitus, type 2     Insulin therapy; exacerbated by prednisone  . Syncope     Admitted 05/2009; magnetic resonance imagin/ MRA - negative; etiology thought to be orthostasis secondary to drugs and dehydration  . GERD (gastroesophageal reflux disease)   . IBS (irritable bowel syndrome)   . Hiatal hernia   . Gastroparesis     99% retention 05/2008 on GES  . Hyperlipidemia   . Hypertension   . Hypothyroid   . Chronic LBP     Surgical intervention in 1996  . Obesity   . OSA on CPAP   . Anemia     H/H of 10/30 with a normal MCV in 12/09  . Barrett's esophagus     Diagnosed 1995. Last EGD 2008.    Past Surgical History  Procedure Date  . Cardiac catheterization 2002  . Cardiovascular stress test 2008    Stress nuclear study  . Back surgery 1996  . Laparoscopic cholecystectomy 1990s  . Total abdominal hysterectomy 1999  . Laminectomy 1995    L4-L5  . Carpal tunnel release 1994  . Esophagogastroduodenoscopy 2008    Barrett's without dysplasia. esphagus dilated. antral erosions, h.pylori serologies negative.  . Colonoscopy     Patient reports normal TCS around 10 years ago.  [employer] capsule study 12/07/2011    Procedure: GIVENS CAPSULE STUDY;  Surgeon: West Bali, MD;  Location: [facility];  Service: Endoscopy;  Laterality: N/A;   Allergies  Allergen Reactions  . Codeine Nausea And Vomiting    REACTION: HALLUCINATIONS      Review of Systems     Objective:   Physical Exam  Vitals reviewed. Constitutional: She is oriented to person, place, and time. She appears well-nourished. No distress.  HENT:  Head: Normocephalic and atraumatic.  Mouth/Throat: No oropharyngeal exudate.  Eyes: Pupils are equal, round, and reactive to light. No scleral icterus.  Neck: Normal range of motion. Neck supple.  Cardiovascular: Normal rate, regular rhythm and normal heart sounds.   Pulmonary/Chest: Effort normal and breath sounds normal. No respiratory distress.  Abdominal: Soft. Bowel sounds are normal. She exhibits no distension. There is no tenderness.  Lymphadenopathy:    She has no cervical adenopathy.  Neurological: She is alert and oriented to person, place, and time.       NO FOCAL DEFICITS   Psychiatric: She has a normal mood and affect.          Assessment & Plan:

## 2012-04-05 ENCOUNTER — Telehealth: Payer: Self-pay | Admitting: Gastroenterology

## 2012-04-05 MED ORDER — POLYSACCHARIDE IRON COMPLEX 150 MG PO CAPS: ORAL_CAPSULE | ORAL | Status: DC

## 2012-04-05 NOTE — Telephone Encounter (Signed)
Called and informed daughter [name].

## 2012-04-05 NOTE — Telephone Encounter (Signed)
PLEASE CALL PT.  HER BLOOD COUNT IS STABLE AT 10.5. HER IRON STORES ARE LOW. SHE NEEDS IRON TWICE DAILY FOR 4=3 MOS THEN ONCE DAILY. HER KIDNEY FUNCTIONS IS NOW NORMAL.  OPV IN 4 MOS INSTEAD OF 6 MOS.

## 2012-04-05 NOTE — Telephone Encounter (Signed)
Faxed results to PCP, recall made for 4 months

## 2012-04-05 NOTE — Progress Notes (Signed)
OCT 2013 HB 10.5 CR 0.76 FERRITIN 9

## 2012-04-12 NOTE — Progress Notes (Signed)
Reminder in epic to follow up in 6 months with SF in E30 °

## 2012-04-19 ENCOUNTER — Other Ambulatory Visit: Payer: Self-pay | Admitting: Cardiology

## 2012-04-30 ENCOUNTER — Ambulatory Visit (INDEPENDENT_AMBULATORY_CARE_PROVIDER_SITE_OTHER): Payer: Medicare Other | Admitting: *Deleted

## 2012-04-30 DIAGNOSIS — I4891 Unspecified atrial fibrillation: Secondary | ICD-10-CM

## 2012-05-11 ENCOUNTER — Telehealth: Payer: Self-pay | Admitting: Cardiology

## 2012-05-11 NOTE — Telephone Encounter (Signed)
PT SCHEDULED FOR FOOT SURGERY 05/16/12 AND NEEDS TO KNOW WHAT TO DO ABOUT HER COUMADIN.

## 2012-05-11 NOTE — Telephone Encounter (Addendum)
Please advise  05/13/2012 9:05 PM Contact patient's surgeon to determine the nature of the procedure that is being performed and the surgeon's plan for anticoagulation.

## 2012-05-14 ENCOUNTER — Telehealth: Payer: Self-pay | Admitting: Cardiology

## 2012-05-14 NOTE — Telephone Encounter (Signed)
Patient states that she is having foot surgery this week.  Wants to know if she needs to do anything different with her Coumadin. / tg

## 2012-05-14 NOTE — Telephone Encounter (Signed)
Pt came by office.  Scheduled for hammer toe surgery on 11/14.  Wants to know what to do about coumadin.  Told pt to go by Dr Loralie Champagne office and ask him if she needed to come off coumadin since I am not sure what this procedure entails.  She will let me know after talking to him.

## 2012-05-15 ENCOUNTER — Encounter (HOSPITAL_COMMUNITY): Payer: Self-pay

## 2012-05-15 ENCOUNTER — Encounter (HOSPITAL_COMMUNITY): Payer: Self-pay | Admitting: Pharmacy Technician

## 2012-05-15 ENCOUNTER — Encounter (HOSPITAL_COMMUNITY)
Admission: RE | Admit: 2012-05-15 | Discharge: 2012-05-15 | Disposition: A | Discharge status: Home / Self Care | Payer: Medicare Other | Source: Ambulatory Visit | Attending: Podiatry | Admitting: Podiatry

## 2012-05-15 LAB — CBC WITH DIFFERENTIAL/PLATELET
HCT: 34.2 % — ABNORMAL LOW (ref 36.0–46.0)
Hemoglobin: 10.6 g/dL — ABNORMAL LOW (ref 12.0–15.0)
Lymphocytes Relative: 34 % (ref 12–46)
Lymphs Abs: 2.1 10*3/uL (ref 0.7–4.0)
MCHC: 31 g/dL (ref 30.0–36.0)
Monocytes Absolute: 0.5 10*3/uL (ref 0.1–1.0)
Monocytes Relative: 9 % (ref 3–12)
Neutro Abs: 3.5 10*3/uL (ref 1.7–7.7)
Neutrophils Relative %: 56 % (ref 43–77)
RBC: 4.11 MIL/uL (ref 3.87–5.11)
WBC: 6.2 10*3/uL (ref 4.0–10.5)

## 2012-05-15 LAB — BASIC METABOLIC PANEL
BUN: 19 mg/dL (ref 6–23)
CO2: 25 mEq/L (ref 19–32)
Chloride: 104 mEq/L (ref 96–112)
Creatinine, Ser: 0.68 mg/dL (ref 0.50–1.10)
Glucose, Bld: 211 mg/dL — ABNORMAL HIGH (ref 70–99)
Potassium: 4.4 mEq/L (ref 3.5–5.1)

## 2012-05-15 LAB — SURGICAL PCR SCREEN: Staphylococcus aureus: NEGATIVE

## 2012-05-15 NOTE — Telephone Encounter (Signed)
Per telephone conversation with Dr Nolen Mu, it is not necessary to hold her coumadin, however would be ideal if she could hold it tonight and tomorrow, just to minimize risk, therefore pt notified and will be started back on therapy after procedure.  He stated that very small incisions are involved and risk is low.

## 2012-05-15 NOTE — Patient Instructions (Signed)
20 [name]  05/15/2012   Your procedure is scheduled on:  05/17/12  Report to [facility] at 1215 AM.  Call this number if you have problems the morning of surgery: [phone]   Remember:   Do not eat food:After Midnight.  May have clear liquids:until Midnight .  Clear liquids include soda, tea, black coffee, apple or grape juice, broth.  Take these medicines the morning of surgery with A SIP OF WATER: diltiazem, celexa, synthroid, omeprazole, ativan.  Only take 25 units of Lantus on Wednesday night.  NO DIABETIC MEDICATIONS on morning of procedure.   Do not wear jewelry, make-up or nail polish.  Do not wear lotions, powders, or perfumes. You may wear deodorant.  Do not shave 48 hours prior to surgery. Men may shave face and neck.  Do not bring valuables to the hospital.  Contacts, dentures or bridgework may not be worn into surgery.  Leave suitcase in the car. After surgery it may be brought to your room.  For patients admitted to the hospital, checkout time is 11:00 AM the day of discharge.   Patients discharged the day of surgery will not be allowed to drive home.  Name and phone number of your driver: family  Special Instructions: Shower using CHG 2 nights before surgery and the night before surgery.  If you shower the day of surgery use CHG.  Use special wash - you have one bottle of CHG for all showers.  You should use approximately 1/3 of the bottle for each shower.   Please read over the following fact sheets that you were given: Pain Booklet, MRSA Information, Surgical Site Infection Prevention, Anesthesia Post-op Instructions and Care and Recovery After Surgery   PATIENT INSTRUCTIONS POST-ANESTHESIA  IMMEDIATELY FOLLOWING SURGERY:  Do not drive or operate machinery for the first twenty four hours after surgery.  Do not make any important decisions for twenty four hours after surgery or while taking narcotic pain medications or sedatives.  If you develop intractable nausea  and vomiting or a severe headache please notify your doctor immediately.  FOLLOW-UP:  Please make an appointment with your surgeon as instructed. You do not need to follow up with anesthesia unless specifically instructed to do so.  WOUND CARE INSTRUCTIONS (if applicable):  Keep a dry clean dressing on the anesthesia/puncture wound site if there is drainage.  Once the wound has quit draining you may leave it open to air.  Generally you should leave the bandage intact for twenty four hours unless there is drainage.  If the epidural site drains for more than 36-48 hours please call the anesthesia department.  QUESTIONS?:  Please feel free to call your physician or the hospital operator if you have any questions, and they will be happy to assist you.      Hammer Toes Hammer toes occur when the joint in one or more of your toes is permanently flexed. CAUSES  This happens when a muscle imbalance or abnormal bone length makes the small toes buckle under. This causes the toe joint to contract. This causes the tendons (cord like structure) to shorten.  SYMPTOMS   When hammer toes are flexible, you can straighten the buckled joint with your hand. Flexible hammer toes may develop into rigid hammer toes over time. Common symptoms of flexible hammer toes include:  Corns (build-ups of skin cells). Corns occur where boney bumps come in frequent contact with hard surfaces. For example, where your shoes press and rub.  Irritation.  Inflammation.  Pain.  Toe motion is limited.  When a rigid hammer toe is fixed you can no longer straighten the buckled joint. Corns, irritation, pain, and loss of motion is generally worse for rigid hammer toes than for flexible ones. TREATMENT  The problems noted above if painful or troublesome can be corrected with surgery. This is an elective surgery, so you can pick a convenient time for the procedure. The surgery may:  Improve appearance.  Relieve pain.  Improve  function. You may be asked not to put weight on this foot for a few weeks. There are several types of surgical treatments. Common treatments are listed below. Your surgeon will discuss what is be best for you.   With arthroplasty, a portion of the joint is surgically removed and the toe is straightened. The "gap" fills in with fibroustissue. This helps with pain, deformity and function.  With fusion, cartilage between the two bones is taken out and the bones heal as one longer bone. This helps keep the toe stable and reduces pain but leaves the toe stiff, yet straight.  With implant, a portion of the bone is removed and replaced with an implant to restore motion.  Flexor tendon transfers may be used to release the deforming force which buckles the toe. This is done by the repositioning of the tendons that curl the toes down (flexor tendons). Several of these options require fixing the toe with a pin that is visible at the tip of the toe. The pin keeps the toe straight during healing. It is generally removed in the office at 4-8 weeks after the corrective procedure. Generally, removing the pin is not painful.  LET YOUR CAREGIVER KNOW ABOUT:  Allergies.  Medications taken including herbs, eye drops, over the counter medications, and creams.  Use of steroids (by mouth or creams).  Previous problems with anesthetics or novocaine.  Possibility of pregnancy, if this applies.  History of blood clots (thrombophlebitis).  History of bleeding or blood problems.  Previous surgery.  Other health problems.  Family history of anesthetic problems. BEFORE THE PROCEDURE You should be present 60 minutes prior to your procedure unless otherwise directed by your caregiver.  RISKS AND COMPLICATIONS  If surgery is recommended, your caregiver will explain your foot problem and how surgery can improve it. Your caregiver can answer questions you may have about potential risks and complications involved.    Let your caregiver know about health changes prior to surgery. It is best to do elective surgeries when you are healthy. Be sure to ask your caregiver how long you will be off your feet and if you need to be off work. Plan accordingly. Your foot and ankle may be immobilized by a cast (from your toes to below your knee). You may be asked not to bear weight on this foot for a few weeks. AFTER THE PROCEDURE You can bear weight as instructed. You may need a bandage, splint, and removable cast boot or surgical shoe for several weeks after surgery. You may resume normal diet and activities as directed. Only take over-the-counter or prescription medicines for pain, discomfort, or fever as directed by your caregiver. SEEK MEDICAL CARE IF:   You have increased bleeding (more than a small spot) from the wound.  You notice redness, swelling, or increasing pain in the wound.  You notice pus coming from the wound or the pin that is used to stabilize the toe.  You notice a bad smell coming from the wound or  dressing. SEEK IMMEDIATE MEDICAL CARE IF:   You have a fever.  You develop a rash.  You have difficulty breathing.  You have any allergic problems. Document Released: 06/17/2000 Document Revised: 09/12/2011 Document Reviewed: 07/18/2008 [facility] Patient Information 2013 [place], [place].

## 2012-05-15 NOTE — Telephone Encounter (Signed)
Dr Nolen Mu will be performing a Flexor Tenotomy on Thursday November 14 th, where general anesthesia will be used.  No incision involved.

## 2012-05-15 NOTE — Telephone Encounter (Signed)
OK to hold warfarin for 3-5 days.  Unless she has already stopped, it will be necessary to delay procedure.  Resume warfarin immediately after procedure has been completed.

## 2012-05-17 ENCOUNTER — Ambulatory Visit (HOSPITAL_COMMUNITY)
Admission: RE | Admit: 2012-05-17 | Discharge: 2012-05-17 | Disposition: A | Discharge status: Home / Self Care | Payer: Medicare Other | Source: Ambulatory Visit | Attending: Podiatry | Admitting: Podiatry

## 2012-05-17 ENCOUNTER — Encounter (HOSPITAL_COMMUNITY): Payer: Self-pay | Admitting: *Deleted

## 2012-05-17 ENCOUNTER — Encounter (HOSPITAL_COMMUNITY): Payer: Self-pay | Admitting: Anesthesiology

## 2012-05-17 ENCOUNTER — Ambulatory Visit (HOSPITAL_COMMUNITY): Payer: Medicare Other | Admitting: Anesthesiology

## 2012-05-17 ENCOUNTER — Encounter (HOSPITAL_COMMUNITY)
Admission: RE | Disposition: A | Discharge status: Home / Self Care | Payer: Self-pay | Source: Ambulatory Visit | Attending: Podiatry

## 2012-05-17 DIAGNOSIS — Z01812 Encounter for preprocedural laboratory examination: Secondary | ICD-10-CM | POA: Insufficient documentation

## 2012-05-17 DIAGNOSIS — I10 Essential (primary) hypertension: Secondary | ICD-10-CM | POA: Insufficient documentation

## 2012-05-17 DIAGNOSIS — M2040 Other hammer toe(s) (acquired), unspecified foot: Secondary | ICD-10-CM | POA: Insufficient documentation

## 2012-05-17 DIAGNOSIS — E119 Type 2 diabetes mellitus without complications: Secondary | ICD-10-CM | POA: Insufficient documentation

## 2012-05-17 DIAGNOSIS — L98499 Non-pressure chronic ulcer of skin of other sites with unspecified severity: Secondary | ICD-10-CM | POA: Insufficient documentation

## 2012-05-17 SURGERY — TENOTOMY, FLEXOR
Anesthesia: Monitor Anesthesia Care | Site: Foot | Laterality: Right | Wound class: Clean

## 2012-05-17 MED ORDER — MIDAZOLAM HCL 5 MG/5ML IJ SOLN
INTRAMUSCULAR | Status: DC | PRN
  Administered 2012-05-17: 2 mg via INTRAVENOUS

## 2012-05-17 MED ORDER — MIDAZOLAM HCL 2 MG/2ML IJ SOLN
INTRAMUSCULAR | Status: AC
  Filled 2012-05-17: qty 2

## 2012-05-17 MED ORDER — PROPOFOL INFUSION 10 MG/ML OPTIME
INTRAVENOUS | Status: DC | PRN
  Administered 2012-05-17: 75 ug/kg/min via INTRAVENOUS

## 2012-05-17 MED ORDER — LACTATED RINGERS IV SOLN
INTRAVENOUS | Status: DC | PRN
  Administered 2012-05-17: 13:00:00 via INTRAVENOUS

## 2012-05-17 MED ORDER — MIDAZOLAM HCL 2 MG/2ML IJ SOLN
1.0000 mg | INTRAMUSCULAR | Status: DC | PRN
  Administered 2012-05-17 (×2): 2 mg via INTRAVENOUS

## 2012-05-17 MED ORDER — LIDOCAINE HCL (PF) 1 % IJ SOLN
INTRAMUSCULAR | Status: AC
  Filled 2012-05-17: qty 5

## 2012-05-17 MED ORDER — CEFAZOLIN SODIUM-DEXTROSE 2-3 GM-% IV SOLR
INTRAVENOUS | Status: DC | PRN
  Administered 2012-05-17: 2 g via INTRAVENOUS

## 2012-05-17 MED ORDER — SODIUM CHLORIDE 0.9 % IR SOLN
Status: DC | PRN
  Administered 2012-05-17: 1000 mL

## 2012-05-17 MED ORDER — FENTANYL CITRATE 0.05 MG/ML IJ SOLN
INTRAMUSCULAR | Status: AC
  Filled 2012-05-17: qty 2

## 2012-05-17 MED ORDER — CEFAZOLIN SODIUM-DEXTROSE 2-3 GM-% IV SOLR
INTRAVENOUS | Status: AC
  Filled 2012-05-17: qty 50

## 2012-05-17 MED ORDER — BUPIVACAINE HCL (PF) 0.5 % IJ SOLN
INTRAMUSCULAR | Status: DC | PRN
  Administered 2012-05-17: 10 mL

## 2012-05-17 MED ORDER — FENTANYL CITRATE 0.05 MG/ML IJ SOLN
INTRAMUSCULAR | Status: DC | PRN
  Administered 2012-05-17 (×2): 25 ug via INTRAVENOUS

## 2012-05-17 MED ORDER — BUPIVACAINE HCL (PF) 0.5 % IJ SOLN
INTRAMUSCULAR | Status: AC
  Filled 2012-05-17: qty 30

## 2012-05-17 MED ORDER — PROPOFOL 10 MG/ML IV EMUL
INTRAVENOUS | Status: AC
  Filled 2012-05-17: qty 20

## 2012-05-17 MED ORDER — LACTATED RINGERS IV SOLN
INTRAVENOUS | Status: DC
  Administered 2012-05-17: 1000 mL via INTRAVENOUS

## 2012-05-17 MED ORDER — CEFAZOLIN SODIUM-DEXTROSE 2-3 GM-% IV SOLR: 2.0000 g | INTRAVENOUS | Status: DC

## 2012-05-17 SURGICAL SUPPLY — 50 items
APL SKNCLS STERI-STRIP NONHPOA (GAUZE/BANDAGES/DRESSINGS) ×1
BAG HAMPER (MISCELLANEOUS) ×2 IMPLANT
BANDAGE CONFORM 2  STR LF (GAUZE/BANDAGES/DRESSINGS) ×2 IMPLANT
BANDAGE ELASTIC 4 VELCRO NS (GAUZE/BANDAGES/DRESSINGS) ×2 IMPLANT
BANDAGE ESMARK 4X12 BL STRL LF (DISPOSABLE) ×1 IMPLANT
BANDAGE GAUZE ELAST BULKY 4 IN (GAUZE/BANDAGES/DRESSINGS) ×2 IMPLANT
BENZOIN TINCTURE PRP APPL 2/3 (GAUZE/BANDAGES/DRESSINGS) ×2 IMPLANT
BLADE AVERAGE 25X9 (BLADE) ×1 IMPLANT
BLADE SURG 15 STRL LF DISP TIS (BLADE) ×2 IMPLANT
BLADE SURG 15 STRL SS (BLADE) ×4
BNDG CMPR 12X4 ELC STRL LF (DISPOSABLE) ×1
BNDG ESMARK 4X12 BLUE STRL LF (DISPOSABLE) ×2
CHLORAPREP W/TINT 26ML (MISCELLANEOUS) ×2 IMPLANT
CLOTH BEACON ORANGE TIMEOUT ST (SAFETY) ×2 IMPLANT
COVER LIGHT HANDLE STERIS (MISCELLANEOUS) ×4 IMPLANT
CUFF TOURNIQUET SINGLE 18IN (TOURNIQUET CUFF) ×1 IMPLANT
DECANTER SPIKE VIAL GLASS SM (MISCELLANEOUS) ×2 IMPLANT
DRAPE OEC MINIVIEW 54X84 (DRAPES) ×2 IMPLANT
DRSG ADAPTIC 3X8 NADH LF (GAUZE/BANDAGES/DRESSINGS) ×2 IMPLANT
DURA STEPPER LG (CAST SUPPLIES) IMPLANT
DURA STEPPER MED (CAST SUPPLIES) IMPLANT
DURA STEPPER SML (CAST SUPPLIES) IMPLANT
DURA STEPPER XL (SOFTGOODS) IMPLANT
ELECT REM PT RETURN 9FT ADLT (ELECTROSURGICAL) ×2
ELECTRODE REM PT RTRN 9FT ADLT (ELECTROSURGICAL) ×1 IMPLANT
GAUZE KERLIX 2  STERILE LF (GAUZE/BANDAGES/DRESSINGS) ×1 IMPLANT
GLOVE BIO SURGEON STRL SZ7.5 (GLOVE) ×2 IMPLANT
GLOVE BIOGEL PI IND STRL 7.0 (GLOVE) IMPLANT
GLOVE BIOGEL PI INDICATOR 7.0 (GLOVE) ×1
GLOVE EXAM NITRILE MD LF STRL (GLOVE) ×1 IMPLANT
GLOVE SS BIOGEL STRL SZ 6.5 (GLOVE) IMPLANT
GLOVE SUPERSENSE BIOGEL SZ 6.5 (GLOVE) ×1
GOWN STRL REIN XL XLG (GOWN DISPOSABLE) ×6 IMPLANT
KIT ROOM TURNOVER AP CYSTO (KITS) ×2 IMPLANT
MANIFOLD NEPTUNE II (INSTRUMENTS) ×2 IMPLANT
NDL HYPO 27GX1-1/4 (NEEDLE) ×3 IMPLANT
NEEDLE HYPO 27GX1-1/4 (NEEDLE) ×4 IMPLANT
NS IRRIG 1000ML POUR BTL (IV SOLUTION) ×2 IMPLANT
PACK BASIC LIMB (CUSTOM PROCEDURE TRAY) ×2 IMPLANT
PAD ARMBOARD 7.5X6 YLW CONV (MISCELLANEOUS) ×2 IMPLANT
RASP SM TEAR CROSS CUT (RASP) IMPLANT
SET BASIN LINEN APH (SET/KITS/TRAYS/PACK) ×2 IMPLANT
SPONGE GAUZE 4X4 12PLY (GAUZE/BANDAGES/DRESSINGS) ×2 IMPLANT
SPONGE LAP 18X18 X RAY DECT (DISPOSABLE) ×1 IMPLANT
STRIP CLOSURE SKIN 1/2X4 (GAUZE/BANDAGES/DRESSINGS) ×3 IMPLANT
SUT PROLENE 4 0 PS 2 18 (SUTURE) ×2 IMPLANT
SUT VIC AB 2-0 CT2 27 (SUTURE) ×2 IMPLANT
SUT VIC AB 4-0 PS2 27 (SUTURE) ×2 IMPLANT
SUT VICRYL AB 3-0 FS1 BRD 27IN (SUTURE) ×2 IMPLANT
SYR CONTROL 10ML LL (SYRINGE) ×4 IMPLANT

## 2012-05-17 NOTE — Anesthesia Postprocedure Evaluation (Signed)
  Anesthesia Post-op Note  Patient: [name]  Procedure(s) Performed: Procedure(s) (LRB) with comments: FLEXOR TENOTOMY (Right) - Flexor Tenotomy 2nd, 3rd & 4th Toes Right Foot  Patient Location: PACU  Anesthesia Type: MAC  Level of Consciousness: awake, alert , oriented and patient cooperative  Airway and Oxygen Therapy: Patient Spontanous Breathing room air  Post-op Pain: mild  Post-op Assessment: Post-op Vital signs reviewed, Patient's Cardiovascular Status Stable, Respiratory Function Stable, Patent Airway and No signs of Nausea or vomiting  Post-op Vital Signs: Reviewed and stable  Complications: No apparent anesthesia complications

## 2012-05-17 NOTE — Op Note (Signed)
OPERATIVE REPORT  SURGEON:   Dallas Schimke, [place]  OR STAFF:   Cyndie Chime, RN - Circulator Sherri Sear, CST - Scrub Person Lizabeth Leyden, RN - Circulator Assistant   PREOPERATIVE DIAGNOSIS:   1.   Hammer toe deformity digits 2-4 right foot 2. Ulceration 3rd toe right foot  POSTOPERATIVE DIAGNOSIS: Same  PROCEDURE: 1. Percutaneous flexor tenotomy of the 2nd digit right foot 2. Percutaneous flexor tenotomy of the 3rd digit right foot 3. Percutaneous flexor tenotomy of the 4th digit right foot  ANESTHESIA:  Monitor Anesthesia Care   HEMOSTASIS:   Pneumatic ankle tourniquet set at 250 mmHg  ESTIMATED BLOOD LOSS:   Minimal (<5 cc)  MATERIALS USED:  None  INJECTABLES: Marcaine 0.5% plain; 10mL  PATHOLOGY:   None  COMPLICATIONS:   None  INDICATIONS:  Semi-flexible contracture of the second digit right foot and flexible contractures of the third and fourth digits of the right foot.  Chronic ulceration of the distal aspect of the right third toe.  DESCRIPTION OF THE PROCEDURE:   The patient was brought to the operating room and placed on the operative table in the supine position.  A pneumatic ankle tourniquet was applied to the patient's ankle.  Following sedation, the surgical site was anesthetized with 0.5% Marcaine plain.  The foot was then prepped, scrubbed, and draped in the usual sterile technique.  The foot was elevated, exsanguinated and the pneumatic ankle tourniquet inflated to 250 mmHg.    Attention was directed to the plantar aspect of the second digit of the right foot.  A percutaneous tenotomy of the flexor digitorum longus tendon was performed without difficulty.  The same procedure was performed to the third and fourth digit of the right foot without alteration.  The contractures of digits 2 through 4 were noted to be improved.  The surgical sites were irrigated with copious amounts of sterile irrigant.  Incisions were closed with 4-0  Prolene in a simple suture technique.  A sterile compressive dressing was applied to the operative foot.  The patient's ankle tourniquet was deflated.  A prompt hyperemic response was noted to all digits of the operative foot.    The patient tolerated the procedure well.  The patient was then transferred to Short Stay with vital signs stable and vascular status intact to all toes of the operative foot.  Following a period of postoperative monitoring, the patient will be discharged home.

## 2012-05-17 NOTE — Anesthesia Preprocedure Evaluation (Signed)
Anesthesia Evaluation  Patient identified by MRN, date of birth, ID band Patient awake    Reviewed: Allergy & Precautions, H&P , NPO status , Patient's Chart, lab work & pertinent test results  Airway Mallampati: II TM Distance: >3 FB     Dental  (+) Edentulous Upper and Edentulous Lower   Pulmonary sleep apnea , former smoker,  breath sounds clear to auscultation        Cardiovascular hypertension, Pt. on medications - angina+CHF + dysrhythmias Atrial Fibrillation Rhythm:Irregular Rate:Normal     Neuro/Psych    GI/Hepatic hiatal hernia, GERD-  Medicated,  Endo/Other  diabetes, Well Controlled, Type 2, Insulin DependentHypothyroidism   Renal/GU      Musculoskeletal   Abdominal   Peds  Hematology   Anesthesia Other Findings   Reproductive/Obstetrics                           Anesthesia Physical Anesthesia Plan  ASA: III  Anesthesia Plan: MAC   Post-op Pain Management:    Induction: Intravenous  Airway Management Planned: Nasal Cannula  Additional Equipment:   Intra-op Plan:   Post-operative Plan:   Informed Consent: I have reviewed the patients History and Physical, chart, labs and discussed the procedure including the risks, benefits and alternatives for the proposed anesthesia with the patient or authorized representative who has indicated his/her understanding and acceptance.     Plan Discussed with:   Anesthesia Plan Comments:         Anesthesia Quick Evaluation

## 2012-05-17 NOTE — Preoperative (Signed)
Beta Blockers   Reason not to administer Beta Blockers:Not Applicable 

## 2012-05-17 NOTE — Anesthesia Procedure Notes (Signed)
Procedure Name: MAC Performed by: ANDRAZA, AMY L Pre-anesthesia Checklist: Patient identified, Patient being monitored, Emergency Drugs available, Timeout performed and Suction available Oxygen Delivery Method: Non-rebreather mask     

## 2012-05-17 NOTE — Transfer of Care (Signed)
  Anesthesia Post-op Note  Patient: [name]  Procedure(s) Performed: Procedure(s) (LRB) with comments: FLEXOR TENOTOMY (Right) - Flexor Tenotomy 2nd, 3rd & 4th Toes Right Foot  Patient Location: PACU  Anesthesia Type: MAC  Level of Consciousness: awake, alert , oriented and patient cooperative  Airway and Oxygen Therapy: Patient Spontanous Breathing room air  Post-op Pain: mild  Post-op Assessment: Post-op Vital signs reviewed, Patient's Cardiovascular Status Stable, Respiratory Function Stable, Patent Airway and No signs of Nausea or vomiting  Post-op Vital Signs: Reviewed and stable  Complications: No apparent anesthesia complications  

## 2012-05-17 NOTE — Brief Op Note (Signed)
BRIEF OPERATIVE NOTE  SURGEON:   Dallas Schimke, DPM  OR STAFF:   Cyndie Chime, RN - Circulator Sherri Sear, CST - Scrub Person Lizabeth Leyden, RN - Circulator Assistant   PREOPERATIVE DIAGNOSIS:   1.   Hammer toe deformity digits 2-4 right foot 2. Ulceration 3rd toe right foot  POSTOPERATIVE DIAGNOSIS: Same  PROCEDURE: 1. Percutaneous flexor tenotomy of the 2nd digit right foot 2. Percutaneous flexor tenotomy of the 3rd digit right foot 3. Percutaneous flexor tenotomy of the 4th digit right foot  ANESTHESIA:  Monitor Anesthesia Care   HEMOSTASIS:   Pneumatic ankle tourniquet set at 250 mmHg  ESTIMATED BLOOD LOSS:   Minimal (<5 cc)  MATERIALS USED:  None  INJECTABLES: Marcaine 0.5% plain; 10mL  PATHOLOGY:   None  COMPLICATIONS:   None  INDICATIONS:  Semi-flexible contracture of the second digit right foot and flexible contractures of the third and fourth digits of the right foot.  Chronic ulceration of the distal aspect of the right third toe.  DICTATION:  Note written in EPIC

## 2012-05-17 NOTE — H&P (Signed)
HISTORY AND PHYSICAL INTERVAL NOTE:  05/17/2012  2:12 PM  [name]  has presented today for surgery, with the diagnosis of hammer toe deformity and ulceration 3rd toe right foot.  The various methods of treatment have been discussed with the patient.  No guarantees were given.  After consideration of risks, benefits and other options for treatment, the patient has consented to surgery.  I have reviewed the patients' chart and labs.    Patient Vitals for the past 24 hrs:  BP Temp Temp src Pulse Resp SpO2  05/17/12 1355 114/74 mmHg - - - 15  92 %  05/17/12 1350 121/72 mmHg - - - 13  92 %  05/17/12 1345 119/67 mmHg - - - 37  94 %  05/17/12 1340 115/66 mmHg - - - 11  95 %  05/17/12 1335 103/67 mmHg - - - 19  95 %  05/17/12 1330 116/69 mmHg - - - 12  92 %  05/17/12 1325 113/68 mmHg - - - 19  87 %  05/17/12 1320 116/73 mmHg - - - 15  87 %  05/17/12 1315 107/72 mmHg - - - 17  96 %  05/17/12 1310 119/65 mmHg - - - 15  94 %  05/17/12 1305 116/68 mmHg - - - 19  93 %  05/17/12 1300 110/59 mmHg - - - 30  96 %  05/17/12 1255 125/72 mmHg - - - 12  95 %  05/17/12 1250 121/86 mmHg - - - 42  -  05/17/12 1245 124/74 mmHg - - - 48  -  05/17/12 1240 131/72 mmHg - - - 53  98 %  05/17/12 1235 136/68 mmHg - - - 36  98 %  05/17/12 1233 136/68 mmHg 99.2 F (37.3 C) Oral 90  20  98 %    A history and physical examination was performed in my office.  The patient was reexamined.  There have been no changes to this history and physical examination.  Dallas Schimke, DPM

## 2012-05-28 ENCOUNTER — Ambulatory Visit (INDEPENDENT_AMBULATORY_CARE_PROVIDER_SITE_OTHER): Payer: Medicare Other | Admitting: *Deleted

## 2012-05-28 DIAGNOSIS — I4891 Unspecified atrial fibrillation: Secondary | ICD-10-CM

## 2012-06-21 ENCOUNTER — Encounter: Payer: Self-pay | Admitting: *Deleted

## 2012-06-25 ENCOUNTER — Ambulatory Visit (INDEPENDENT_AMBULATORY_CARE_PROVIDER_SITE_OTHER): Payer: Medicare Other | Admitting: *Deleted

## 2012-06-25 DIAGNOSIS — I4891 Unspecified atrial fibrillation: Secondary | ICD-10-CM

## 2012-07-20 ENCOUNTER — Encounter (HOSPITAL_COMMUNITY): Payer: Self-pay | Admitting: *Deleted

## 2012-07-20 ENCOUNTER — Emergency Department (HOSPITAL_COMMUNITY)
Admission: EM | Admit: 2012-07-20 | Discharge: 2012-07-20 | Disposition: A | Discharge status: Home / Self Care | Payer: Medicare Other | Attending: Emergency Medicine | Admitting: Emergency Medicine

## 2012-07-20 DIAGNOSIS — K449 Diaphragmatic hernia without obstruction or gangrene: Secondary | ICD-10-CM | POA: Insufficient documentation

## 2012-07-20 DIAGNOSIS — E785 Hyperlipidemia, unspecified: Secondary | ICD-10-CM | POA: Insufficient documentation

## 2012-07-20 DIAGNOSIS — Z8679 Personal history of other diseases of the circulatory system: Secondary | ICD-10-CM | POA: Insufficient documentation

## 2012-07-20 DIAGNOSIS — I4891 Unspecified atrial fibrillation: Secondary | ICD-10-CM | POA: Insufficient documentation

## 2012-07-20 DIAGNOSIS — E669 Obesity, unspecified: Secondary | ICD-10-CM | POA: Insufficient documentation

## 2012-07-20 DIAGNOSIS — K219 Gastro-esophageal reflux disease without esophagitis: Secondary | ICD-10-CM | POA: Insufficient documentation

## 2012-07-20 DIAGNOSIS — Z862 Personal history of diseases of the blood and blood-forming organs and certain disorders involving the immune mechanism: Secondary | ICD-10-CM | POA: Insufficient documentation

## 2012-07-20 DIAGNOSIS — Z79899 Other long term (current) drug therapy: Secondary | ICD-10-CM | POA: Insufficient documentation

## 2012-07-20 DIAGNOSIS — R197 Diarrhea, unspecified: Secondary | ICD-10-CM | POA: Insufficient documentation

## 2012-07-20 DIAGNOSIS — K589 Irritable bowel syndrome without diarrhea: Secondary | ICD-10-CM | POA: Insufficient documentation

## 2012-07-20 DIAGNOSIS — Z794 Long term (current) use of insulin: Secondary | ICD-10-CM | POA: Insufficient documentation

## 2012-07-20 DIAGNOSIS — R109 Unspecified abdominal pain: Secondary | ICD-10-CM | POA: Insufficient documentation

## 2012-07-20 DIAGNOSIS — I10 Essential (primary) hypertension: Secondary | ICD-10-CM | POA: Insufficient documentation

## 2012-07-20 DIAGNOSIS — Z8719 Personal history of other diseases of the digestive system: Secondary | ICD-10-CM | POA: Insufficient documentation

## 2012-07-20 DIAGNOSIS — Z87891 Personal history of nicotine dependence: Secondary | ICD-10-CM | POA: Insufficient documentation

## 2012-07-20 DIAGNOSIS — G4733 Obstructive sleep apnea (adult) (pediatric): Secondary | ICD-10-CM | POA: Insufficient documentation

## 2012-07-20 DIAGNOSIS — Z7901 Long term (current) use of anticoagulants: Secondary | ICD-10-CM | POA: Insufficient documentation

## 2012-07-20 DIAGNOSIS — E119 Type 2 diabetes mellitus without complications: Secondary | ICD-10-CM | POA: Insufficient documentation

## 2012-07-20 DIAGNOSIS — I509 Heart failure, unspecified: Secondary | ICD-10-CM | POA: Insufficient documentation

## 2012-07-20 DIAGNOSIS — G8929 Other chronic pain: Secondary | ICD-10-CM | POA: Insufficient documentation

## 2012-07-20 DIAGNOSIS — E039 Hypothyroidism, unspecified: Secondary | ICD-10-CM | POA: Insufficient documentation

## 2012-07-20 LAB — COMPREHENSIVE METABOLIC PANEL
ALT: 9 U/L (ref 0–35)
Alkaline Phosphatase: 46 U/L (ref 39–117)
BUN: 23 mg/dL (ref 6–23)
CO2: 23 mEq/L (ref 19–32)
Calcium: 9.4 mg/dL (ref 8.4–10.5)
GFR calc Af Amer: 75 mL/min — ABNORMAL LOW (ref >90)
GFR calc non Af Amer: 65 mL/min — ABNORMAL LOW (ref >90)
Glucose, Bld: 129 mg/dL — ABNORMAL HIGH (ref 70–99)
Potassium: 3.8 mEq/L (ref 3.5–5.1)
Sodium: 136 mEq/L (ref 135–145)
Total Protein: 7.2 g/dL (ref 6.0–8.3)

## 2012-07-20 LAB — CBC WITH DIFFERENTIAL/PLATELET
Eosinophils Absolute: 0 10*3/uL (ref 0.0–0.7)
Eosinophils Relative: 0 % (ref 0–5)
Hemoglobin: 11.6 g/dL — ABNORMAL LOW (ref 12.0–15.0)
Lymphocytes Relative: 41 % (ref 12–46)
Lymphs Abs: 3.2 10*3/uL (ref 0.7–4.0)
MCH: 26.3 pg (ref 26.0–34.0)
MCV: 82.8 fL (ref 78.0–100.0)
Monocytes Relative: 12 % (ref 3–12)
Platelets: 229 10*3/uL (ref 150–400)
RBC: 4.41 MIL/uL (ref 3.87–5.11)
WBC: 7.6 10*3/uL (ref 4.0–10.5)

## 2012-07-20 MED ORDER — SODIUM CHLORIDE 0.9 % IV BOLUS (SEPSIS)
1000.0000 mL | Freq: Once | INTRAVENOUS | Status: AC
  Administered 2012-07-20: 1000 mL via INTRAVENOUS

## 2012-07-20 NOTE — ED Notes (Signed)
Diarrhea for 2 days. NO n/v.  No rectal bleeding.  No fever

## 2012-07-20 NOTE — ED Provider Notes (Signed)
History  This chart was scribed for [name], MD by Erskine Emery, ED Scribe. This patient was seen in room [facility]16A/[facility]A and the patient's care was started at 16:48.   CSN: [id number]  Arrival date & time 07/20/12  1445   First MD Initiated Contact with Patient 07/20/12 1648      Chief Complaint  Patient presents with  . Diarrhea    (Consider location/radiation/quality/duration/timing/severity/associated sxs/prior Treatment) [name] is a 62 y.o. female who presents to the Emergency Department complaining of almost constant diarrhea since Tuesday night (4 days ago). Pt reports she has been through many pads because the diarrhea does not seem to stop. She reports some associated abdominal spasms but claims she has not had one since 12 pm this afternoon. Pt denies any associated emesis, fever, chills, or blood in her diarrhea. Her and her husband have both been sick this week, he has sinusitis. She is not taking any antibiotics but has been taking alka seltzer for her cold symptoms. Patient is a 62 y.o. female presenting with diarrhea. The history is provided by the patient and a relative. No language interpreter was used.  Diarrhea The primary symptoms include abdominal pain and diarrhea. Primary symptoms do not include fever, weight loss, fatigue, nausea, vomiting, melena, hematemesis, jaundice, dysuria or rash. The illness began 3 to 5 days ago. The onset was gradual (mild). The problem has not changed since onset. The illness does not include chills, anorexia, dysphagia, constipation, back pain or itching. Significant associated medical issues include GERD and irritable bowel syndrome. Associated medical issues do not include gallstones, liver disease or alcohol abuse. Risk factors: none\   Dr. Sharen Hint is the pt's PCP.  Past Medical History  Diagnosis Date  . Chest pain     Negative cardiac catheterization in 2002; negative stress nuclear study in 2008  . Congestive heart  failure     Normal LV systolic function  . PAF (paroxysmal atrial fibrillation) 2009  . Chronic anticoagulation   . Diabetes mellitus, type 2     Insulin therapy; exacerbated by prednisone  . Syncope     Admitted 05/2009; magnetic resonance imagin/ MRA - negative; etiology thought to be orthostasis secondary to drugs and dehydration  . GERD (gastroesophageal reflux disease)   . IBS (irritable bowel syndrome)   . Hiatal hernia   . Gastroparesis     99% retention 05/2008 on GES  . Hyperlipidemia   . Hypertension   . Hypothyroid   . Chronic LBP     Surgical intervention in 1996  . Obesity   . OSA on CPAP   . Anemia     H/H of 10/30 with a normal MCV in 12/09  . Barrett's esophagus     Diagnosed 1995. Last EGD 2008.     Past Surgical History  Procedure Date  . Cardiovascular stress test 2008    Stress nuclear study  . Back surgery 1996  . Laparoscopic cholecystectomy 1990s  . Total abdominal hysterectomy 1999  . Laminectomy 1995    L4-L5  . Carpal tunnel release 1994  . Esophagogastroduodenoscopy 2008    Barrett's without dysplasia. esphagus dilated. antral erosions, h.pylori serologies negative.  . Colonoscopy     Patient reports normal TCS around 10 years ago.  [employer] capsule study 12/07/2011    Procedure: GIVENS CAPSULE STUDY;  Surgeon: [place] Bali, MD;  Location: [facility];  Service: Endoscopy;  Laterality: N/A;  . Cardiac catheterization 2002    Family  History  Problem Relation Age of Onset  . Hypertension Mother   . Alzheimer's disease Mother   . Stroke Mother   . Heart attack Mother   . Heart disease Neg Hx   . Hypertension Other   . Colon cancer Neg Hx     History  Substance Use Topics  . Smoking status: Former Games developer  . Smokeless tobacco: Former Neurosurgeon     Comment: Remote  . Alcohol Use: No     Comment: last etoh one year ago, never frequent    OB History    Grav Para Term Preterm Abortions TAB SAB Ect Mult Living                   Review of Systems  Constitutional: Negative for fever, chills, weight loss and fatigue.  HENT: Negative for congestion, sinus pressure and ear discharge.   Eyes: Negative for discharge.  Respiratory: Negative for cough.   Cardiovascular: Negative for chest pain.  Gastrointestinal: Positive for abdominal pain and diarrhea. Negative for dysphagia, nausea, vomiting, constipation, melena, anorexia, hematemesis and jaundice.  Genitourinary: Negative for dysuria, frequency and hematuria.  Musculoskeletal: Negative for back pain.  Skin: Negative for itching and rash.  Neurological: Negative for seizures and headaches.  Hematological: Negative.   Psychiatric/Behavioral: Negative for hallucinations.  All other systems reviewed and are negative.    Allergies  Codeine  Home Medications   Current Outpatient Rx  Name  Route  Sig  Dispense  Refill  . CITALOPRAM HYDROBROMIDE 20 MG PO TABS   Oral   Take 20 mg by mouth every morning.          Marland Kitchen DILTIAZEM HCL 120 MG PO TABS   Oral   Take 1 tablet (120 mg total) by mouth 2 (two) times daily.   60 tablet   12   . FUROSEMIDE 40 MG PO TABS   Oral   Take 40 mg by mouth daily as needed. Swelling         . GLIPIZIDE 10 MG PO TABS   Oral   Take 10 mg by mouth daily.          . INSULIN GLARGINE 100 UNIT/ML [place] SOLN   Subcutaneous   Inject 50 Units into the skin at bedtime.          Marland Kitchen POLYSACCHARIDE IRON COMPLEX 150 MG PO CAPS   Oral   Take 150 mg by mouth 2 (two) times daily. 1 PO BID FOR 3 MOS THEN ONCE DAILY         . LEVOTHYROXINE SODIUM 175 MCG PO TABS   Oral   Take 175 mcg by mouth every morning.         Marland Kitchen LOPERAMIDE HCL 2 MG PO CAPS   Oral   Take 2 mg by mouth 4 (four) times daily as needed. Diarrhea         . LORAZEPAM 1 MG PO TABS   Oral   Take 1 mg by mouth 3 (three) times daily as needed. For anxiety         . METAXALONE 800 MG PO TABS   Oral   Take 800 mg by mouth 3 (three) times daily.           Marland Kitchen METFORMIN HCL 500 MG PO TABS   Oral   Take 1,000 mg by mouth 2 (two) times daily with a meal.         . ADULT MULTIVITAMIN W/MINERALS [facility]  Oral   Take 1 tablet by mouth every morning.         Marland Kitchen NITROGLYCERIN 0.4 MG SL SUBL   Sublingual   Place 0.4 mg under the tongue as needed. For chest pain          . OMEPRAZOLE 20 MG PO CPDR   Oral   Take 20 mg by mouth 2 (two) times daily.           Marland Kitchen ONDANSETRON HCL 8 MG PO TABS   Oral   Take 8 mg by mouth as needed. nausea         . POTASSIUM CHLORIDE CRYS ER 20 MEQ PO TBCR   Oral   Take 20 mEq by mouth every morning.          Marland Kitchen SIMVASTATIN 40 MG PO TABS   Oral   Take 40 mg by mouth at bedtime.           . WARFARIN SODIUM 2.5 MG PO TABS   Oral   Take 1.25-2.5 mg by mouth daily. 1 tablet every day except 1/2 tablet on Monday, Wednesdays and Friday           Triage Vitals: BP 115/52  Pulse 80  Temp 98.4 F (36.9 C) (Oral)  Resp 20  Ht 5\' 6"  (1.676 m)  Wt 222 lb (100.699 kg)  BMI 35.83 kg/m2  SpO2 97%  Physical Exam  Nursing note and vitals reviewed. Constitutional: She is oriented to person, place, and time. She appears well-developed.  HENT:  Head: Normocephalic and atraumatic.       Mucus membranes are a little dry.  Eyes: Conjunctivae normal and EOM are normal. No scleral icterus.  Neck: Neck supple. No thyromegaly present.  Cardiovascular: Normal rate and regular rhythm.  Exam reveals no gallop and no friction rub.   No murmur heard. Pulmonary/Chest: No stridor. She has no wheezes. She has no rales. She exhibits no tenderness.  Abdominal: Soft. She exhibits no distension. There is tenderness. There is no rebound.       Minimal LLQ tenderness.  Musculoskeletal: Normal range of motion. She exhibits no edema.  Lymphadenopathy:    She has no cervical adenopathy.  Neurological: She is oriented to person, place, and time. Coordination normal.  Skin: No rash noted. No erythema.  Psychiatric: She has  a normal mood and affect. Her behavior is normal.    ED Course  Procedures (including critical care time) DIAGNOSTIC STUDIES: Oxygen Saturation is 97% on room air, adequate by my interpretation.    COORDINATION OF CARE: 16:50--I evaluated the patient and we discussed a treatment plan including blood work and IV fluids to which the pt agreed.   18:32--I rechecked the pt and notified her that her blood work looks fine. Pt reports she has been taking an OTC  anti-diarrhea pill. I told her to stay away from caffeine and follow up with Dr. Sharen Hint on Monday if her symptoms persist.  Results for orders placed during the hospital encounter of 07/20/12  CBC WITH DIFFERENTIAL      Component Value Range   WBC 7.6  4.0 - 10.5 K/uL   RBC 4.41  3.87 - 5.11 MIL/uL   Hemoglobin 11.6 (*) 12.0 - 15.0 g/dL   HCT 57.8  46.9 - 62.9 %   MCV 82.8  78.0 - 100.0 fL   MCH 26.3  26.0 - 34.0 pg   MCHC 31.8  30.0 - 36.0 g/dL   RDW 52.8 (*) 41.3 -  15.5 %   Platelets 229  150 - 400 K/uL   Neutrophils Relative 46  43 - 77 %   Neutro Abs 3.5  1.7 - 7.7 K/uL   Lymphocytes Relative 41  12 - 46 %   Lymphs Abs 3.2  0.7 - 4.0 K/uL   Monocytes Relative 12  3 - 12 %   Monocytes Absolute 0.9  0.1 - 1.0 K/uL   Eosinophils Relative 0  0 - 5 %   Eosinophils Absolute 0.0  0.0 - 0.7 K/uL   Basophils Relative 0  0 - 1 %   Basophils Absolute 0.0  0.0 - 0.1 K/uL  COMPREHENSIVE METABOLIC PANEL      Component Value Range   Sodium 136  135 - 145 mEq/L   Potassium 3.8  3.5 - 5.1 mEq/L   Chloride 102  96 - 112 mEq/L   CO2 23  19 - 32 mEq/L   Glucose, Bld 129 (*) 70 - 99 mg/dL   BUN 23  6 - 23 mg/dL   Creatinine, Ser 1.61  0.50 - 1.10 mg/dL   Calcium 9.4  8.4 - 09.6 mg/dL   Total Protein 7.2  6.0 - 8.3 g/dL   Albumin 4.1  3.5 - 5.2 g/dL   AST 14  0 - 37 U/L   ALT 9  0 - 35 U/L   Alkaline Phosphatase 46  39 - 117 U/L   Total Bilirubin 0.3  0.3 - 1.2 mg/dL   GFR calc non Af Amer 65 (*) >90 mL/min   GFR calc Af Amer 75  (*) >90 mL/min   No results found.    No diagnosis found.    MDM     The chart was scribed for me under my direct supervision.  I personally performed the history, physical, and medical decision making and all procedures in the evaluation of this patient.[name], MD 07/20/12 [phone]

## 2012-07-25 ENCOUNTER — Ambulatory Visit (INDEPENDENT_AMBULATORY_CARE_PROVIDER_SITE_OTHER): Payer: Medicare Other | Admitting: *Deleted

## 2012-07-25 DIAGNOSIS — I4891 Unspecified atrial fibrillation: Secondary | ICD-10-CM

## 2012-07-25 LAB — POCT INR: INR: 2.5

## 2012-07-30 ENCOUNTER — Ambulatory Visit (HOSPITAL_COMMUNITY)
Admission: RE | Admit: 2012-07-30 | Discharge: 2012-07-30 | Disposition: A | Discharge status: Home / Self Care | Payer: Medicare Other | Source: Ambulatory Visit | Attending: Family Medicine | Admitting: Family Medicine

## 2012-07-30 ENCOUNTER — Other Ambulatory Visit (HOSPITAL_COMMUNITY): Payer: Self-pay | Admitting: Family Medicine

## 2012-07-30 DIAGNOSIS — M5380 Other specified dorsopathies, site unspecified: Secondary | ICD-10-CM | POA: Insufficient documentation

## 2012-07-30 DIAGNOSIS — M545 Low back pain, unspecified: Secondary | ICD-10-CM | POA: Insufficient documentation

## 2012-08-27 ENCOUNTER — Ambulatory Visit (INDEPENDENT_AMBULATORY_CARE_PROVIDER_SITE_OTHER): Payer: Medicare Other | Admitting: *Deleted

## 2012-08-27 DIAGNOSIS — I4891 Unspecified atrial fibrillation: Secondary | ICD-10-CM

## 2012-09-10 ENCOUNTER — Ambulatory Visit (INDEPENDENT_AMBULATORY_CARE_PROVIDER_SITE_OTHER): Payer: Medicare Other | Admitting: *Deleted

## 2012-09-10 DIAGNOSIS — I4891 Unspecified atrial fibrillation: Secondary | ICD-10-CM

## 2012-10-04 ENCOUNTER — Emergency Department (HOSPITAL_COMMUNITY): Payer: Medicare Other

## 2012-10-04 ENCOUNTER — Emergency Department (HOSPITAL_COMMUNITY)
Admission: EM | Admit: 2012-10-04 | Discharge: 2012-10-04 | Disposition: A | Discharge status: Home / Self Care | Payer: Medicare Other | Attending: Emergency Medicine | Admitting: Emergency Medicine

## 2012-10-04 ENCOUNTER — Encounter (HOSPITAL_COMMUNITY): Payer: Self-pay | Admitting: *Deleted

## 2012-10-04 DIAGNOSIS — W010XXA Fall on same level from slipping, tripping and stumbling without subsequent striking against object, initial encounter: Secondary | ICD-10-CM | POA: Insufficient documentation

## 2012-10-04 DIAGNOSIS — E119 Type 2 diabetes mellitus without complications: Secondary | ICD-10-CM | POA: Insufficient documentation

## 2012-10-04 DIAGNOSIS — R5381 Other malaise: Secondary | ICD-10-CM | POA: Insufficient documentation

## 2012-10-04 DIAGNOSIS — Z9981 Dependence on supplemental oxygen: Secondary | ICD-10-CM | POA: Insufficient documentation

## 2012-10-04 DIAGNOSIS — S6990XA Unspecified injury of unspecified wrist, hand and finger(s), initial encounter: Secondary | ICD-10-CM | POA: Insufficient documentation

## 2012-10-04 DIAGNOSIS — S0990XA Unspecified injury of head, initial encounter: Secondary | ICD-10-CM | POA: Insufficient documentation

## 2012-10-04 DIAGNOSIS — K219 Gastro-esophageal reflux disease without esophagitis: Secondary | ICD-10-CM | POA: Insufficient documentation

## 2012-10-04 DIAGNOSIS — G4733 Obstructive sleep apnea (adult) (pediatric): Secondary | ICD-10-CM | POA: Insufficient documentation

## 2012-10-04 DIAGNOSIS — Z794 Long term (current) use of insulin: Secondary | ICD-10-CM | POA: Insufficient documentation

## 2012-10-04 DIAGNOSIS — S0003XA Contusion of scalp, initial encounter: Secondary | ICD-10-CM | POA: Insufficient documentation

## 2012-10-04 DIAGNOSIS — S8990XA Unspecified injury of unspecified lower leg, initial encounter: Secondary | ICD-10-CM | POA: Insufficient documentation

## 2012-10-04 DIAGNOSIS — S199XXA Unspecified injury of neck, initial encounter: Secondary | ICD-10-CM | POA: Insufficient documentation

## 2012-10-04 DIAGNOSIS — S0993XA Unspecified injury of face, initial encounter: Secondary | ICD-10-CM | POA: Insufficient documentation

## 2012-10-04 DIAGNOSIS — S59909A Unspecified injury of unspecified elbow, initial encounter: Secondary | ICD-10-CM | POA: Insufficient documentation

## 2012-10-04 DIAGNOSIS — Z8719 Personal history of other diseases of the digestive system: Secondary | ICD-10-CM | POA: Insufficient documentation

## 2012-10-04 DIAGNOSIS — E785 Hyperlipidemia, unspecified: Secondary | ICD-10-CM | POA: Insufficient documentation

## 2012-10-04 DIAGNOSIS — D689 Coagulation defect, unspecified: Secondary | ICD-10-CM | POA: Insufficient documentation

## 2012-10-04 DIAGNOSIS — Z87891 Personal history of nicotine dependence: Secondary | ICD-10-CM | POA: Insufficient documentation

## 2012-10-04 DIAGNOSIS — S59919A Unspecified injury of unspecified forearm, initial encounter: Secondary | ICD-10-CM | POA: Insufficient documentation

## 2012-10-04 DIAGNOSIS — Y9289 Other specified places as the place of occurrence of the external cause: Secondary | ICD-10-CM | POA: Insufficient documentation

## 2012-10-04 DIAGNOSIS — I10 Essential (primary) hypertension: Secondary | ICD-10-CM | POA: Insufficient documentation

## 2012-10-04 DIAGNOSIS — Z9861 Coronary angioplasty status: Secondary | ICD-10-CM | POA: Insufficient documentation

## 2012-10-04 DIAGNOSIS — Z79899 Other long term (current) drug therapy: Secondary | ICD-10-CM | POA: Insufficient documentation

## 2012-10-04 DIAGNOSIS — Y9301 Activity, walking, marching and hiking: Secondary | ICD-10-CM | POA: Insufficient documentation

## 2012-10-04 DIAGNOSIS — T45515A Adverse effect of anticoagulants, initial encounter: Secondary | ICD-10-CM | POA: Insufficient documentation

## 2012-10-04 DIAGNOSIS — I509 Heart failure, unspecified: Secondary | ICD-10-CM | POA: Insufficient documentation

## 2012-10-04 DIAGNOSIS — I4891 Unspecified atrial fibrillation: Secondary | ICD-10-CM | POA: Insufficient documentation

## 2012-10-04 DIAGNOSIS — E669 Obesity, unspecified: Secondary | ICD-10-CM | POA: Insufficient documentation

## 2012-10-04 DIAGNOSIS — Z7901 Long term (current) use of anticoagulants: Secondary | ICD-10-CM | POA: Insufficient documentation

## 2012-10-04 DIAGNOSIS — S1093XA Contusion of unspecified part of neck, initial encounter: Secondary | ICD-10-CM | POA: Insufficient documentation

## 2012-10-04 DIAGNOSIS — Z862 Personal history of diseases of the blood and blood-forming organs and certain disorders involving the immune mechanism: Secondary | ICD-10-CM | POA: Insufficient documentation

## 2012-10-04 DIAGNOSIS — W1809XA Striking against other object with subsequent fall, initial encounter: Secondary | ICD-10-CM | POA: Insufficient documentation

## 2012-10-04 LAB — PROTIME-INR
INR: 5.31 (ref 0.00–1.49)
Prothrombin Time: 44.2 seconds — ABNORMAL HIGH (ref 11.6–15.2)

## 2012-10-04 LAB — CBC WITH DIFFERENTIAL/PLATELET
HCT: 36.9 % (ref 36.0–46.0)
Hemoglobin: 11.7 g/dL — ABNORMAL LOW (ref 12.0–15.0)
Lymphs Abs: 3.1 10*3/uL (ref 0.7–4.0)
Monocytes Absolute: 1 10*3/uL (ref 0.1–1.0)
Monocytes Relative: 12 % (ref 3–12)
Neutro Abs: 4 10*3/uL (ref 1.7–7.7)
Neutrophils Relative %: 49 % (ref 43–77)
RBC: 4.32 MIL/uL (ref 3.87–5.11)

## 2012-10-04 LAB — BASIC METABOLIC PANEL
BUN: 13 mg/dL (ref 6–23)
CO2: 19 mEq/L (ref 19–32)
Chloride: 106 mEq/L (ref 96–112)
Creatinine, Ser: 0.68 mg/dL (ref 0.50–1.10)
GFR calc Af Amer: >90 mL/min (ref >90)
Glucose, Bld: 137 mg/dL — ABNORMAL HIGH (ref 70–99)
Potassium: 4.4 mEq/L (ref 3.5–5.1)

## 2012-10-04 MED ORDER — FENTANYL CITRATE 0.05 MG/ML IJ SOLN
100.0000 ug | Freq: Once | INTRAMUSCULAR | Status: AC
  Administered 2012-10-04: 100 ug via INTRAVENOUS
  Filled 2012-10-04: qty 2

## 2012-10-04 MED ORDER — OXYCODONE-ACETAMINOPHEN 5-325 MG PO TABS: 2.0000 | ORAL_TABLET | Freq: Four times a day (QID) | ORAL | Status: DC | PRN

## 2012-10-04 NOTE — ED Notes (Addendum)
Fell when getting OOB, contusion to chin,  , rt arm pain,  Neck is "sore".  No LOC.   Pain rt elbow. And rt knee  C collar placed at triage  Takes coumadin

## 2012-10-04 NOTE — ED Notes (Signed)
CRITICAL VALUE ALERT  Critical value received:  INR 5.31 Date of notification: 10/04/12  Time of notification:  2107 Critical value read back:yes  Nurse who received alert: Jeannette How, RN MD notified:  Dr. Fonnie Jarvis  Time:  2109

## 2012-10-04 NOTE — ED Notes (Signed)
Up and dressed with a little help from family, moved to w/c without difficulty.

## 2012-10-04 NOTE — ED Provider Notes (Signed)
History  This chart was scribed for Tara Horn, MD by Bennett Scrape, ED Scribe. This patient was seen in room APA18/APA18 and the patient's care was started at 7:01 PM.  CSN: [id number]  Arrival date & time 10/04/12  1545   First MD Initiated Contact with Patient 10/04/12 1901      Chief Complaint  Patient presents with  . Fall     The history is provided by the patient. No language interpreter was used.    [name] is a 62 y.o. female who presents to the Emergency Department complaining of fall that occurred yesterday. Pt states that she was walking to her car, tripped and hit her chin on driveway. The fall was witnessed by her husband who at the time of the fall agreed with the pt's account. She reports a few mild HAs intermittent, right elbow, left wrist, right knee and constant neck pain since the fall. She reports partial amnesia and states that she slipped off of her bed onto the floor today due to pain and generalized weakness. She has been taking tylenol with mild improvement in the pains since. She is currently on coumadin for A. Fib and states that the level is usually pretty steady. She denies changes in bowels or bladder, numbness, urinary incontinence, CP, abdominal pain and SOB as associated symptoms. She also has a h/o CHF, DM, GERD and HTN. She is a former smoker but denies alcohol use.  Past Medical History  Diagnosis Date  . Chest pain     Negative cardiac catheterization in 2002; negative stress nuclear study in 2008  . Congestive heart failure     Normal LV systolic function  . PAF (paroxysmal atrial fibrillation) 2009  . Chronic anticoagulation   . Diabetes mellitus, type 2     Insulin therapy; exacerbated by prednisone  . Syncope     Admitted 05/2009; magnetic resonance imagin/ MRA - negative; etiology thought to be orthostasis secondary to drugs and dehydration  . GERD (gastroesophageal reflux disease)   . IBS (irritable bowel syndrome)   . Hiatal  hernia   . Gastroparesis     99% retention 05/2008 on GES  . Hyperlipidemia   . Hypertension   . Hypothyroid   . Chronic LBP     Surgical intervention in 1996  . Obesity   . OSA on CPAP   . Anemia     H/H of 10/30 with a normal MCV in 12/09  . Barrett's esophagus     Diagnosed 1995. Last EGD 2008.     Past Surgical History  Procedure Laterality Date  . Cardiovascular stress test  2008    Stress nuclear study  . Back surgery  1996  . Laparoscopic cholecystectomy  1990s  . Total abdominal hysterectomy  1999  . Laminectomy  1995    L4-L5  . Carpal tunnel release  1994  . Esophagogastroduodenoscopy  2008    Barrett's without dysplasia. esphagus dilated. antral erosions, h.pylori serologies negative.  . Colonoscopy      Patient reports normal TCS around 10 years ago.  [employer] capsule study  12/07/2011    Procedure: GIVENS CAPSULE STUDY;  Surgeon: West Bali, MD;  Location: [facility];  Service: Endoscopy;  Laterality: N/A;  . Cardiac catheterization  2002    Family History  Problem Relation Age of Onset  . Hypertension Mother   . Alzheimer's disease Mother   . Stroke Mother   . Heart attack Mother   .  Heart disease Neg Hx   . Hypertension Other   . Colon cancer Neg Hx     History  Substance Use Topics  . Smoking status: Former Games developer  . Smokeless tobacco: Former Neurosurgeon     Comment: Remote  . Alcohol Use: No     Comment: last etoh one year ago, never frequent    No OB history provided.  Review of Systems  10 Systems reviewed and all are negative for acute change except as noted in the HPI.  Allergies  Codeine  Home Medications   Current Outpatient Rx  Name  Route  Sig  Dispense  Refill  . acetaminophen (TYLENOL) 500 MG tablet   Oral   Take 1,000 mg by mouth daily as needed. For pain         . citalopram (CELEXA) 20 MG tablet   Oral   Take 20 mg by mouth every morning.          Marland Kitchen glipiZIDE (GLUCOTROL) 10 MG tablet   Oral   Take 10 mg by  mouth 2 (two) times daily.          . insulin glargine (LANTUS) 100 UNIT/ML injection   Subcutaneous   Inject 50 Units into the skin at bedtime. *ONLY takes when blood sugar levels exceed 200*         . levothyroxine (SYNTHROID, LEVOTHROID) 175 MCG tablet   Oral   Take 175 mcg by mouth every morning.         . loperamide (IMODIUM) 2 MG capsule   Oral   Take 2 mg by mouth 4 (four) times daily as needed. Diarrhea         . LORazepam (ATIVAN) 1 MG tablet   Oral   Take 1 mg by mouth 3 (three) times daily as needed. For anxiety         . metaxalone (SKELAXIN) 800 MG tablet   Oral   Take 800 mg by mouth daily.          . metFORMIN (GLUCOPHAGE) 500 MG tablet   Oral   Take 1,000 mg by mouth 2 (two) times daily with a meal.         . omeprazole (PRILOSEC) 20 MG capsule   Oral   Take 20 mg by mouth 2 (two) times daily.           . ondansetron (ZOFRAN) 8 MG tablet   Oral   Take 8 mg by mouth daily as needed. nausea         . potassium chloride SA (K-DUR,KLOR-CON) 20 MEQ tablet   Oral   Take 20 mEq by mouth 3 (three) times daily.          . simvastatin (ZOCOR) 40 MG tablet   Oral   Take 40 mg by mouth at bedtime.           . furosemide (LASIX) 40 MG tablet   Oral   Take 40 mg by mouth daily as needed. Swelling         . nitroGLYCERIN (NITROSTAT) 0.4 MG SL tablet   Sublingual   Place 0.4 mg under the tongue as needed. For chest pain          . oxyCODONE-acetaminophen (PERCOCET) 5-325 MG per tablet   Oral   Take 2 tablets by mouth every 6 (six) hours as needed for pain.   20 tablet   0     Triage Vitals: BP 116/90  Pulse 120  Temp(Src) 98.7 F (37.1 C) (Oral)  Resp 18  Ht 5\' 6"  (1.676 m)  Wt 221 lb (100.245 kg)  BMI 35.69 kg/m2  SpO2 99%  Physical Exam  Nursing note and vitals reviewed. Constitutional: Cervical collar in place.  Awake, alert, nontoxic appearance with baseline speech for patient.  HENT:  Mouth/Throat: No  oropharyngeal exudate.  Dentition is normal, normal jaw opening and occulusion, no trismus, ecchymosis to the left chin  Eyes: EOM are normal. Pupils are equal, round, and reactive to light. Right eye exhibits no discharge. Left eye exhibits no discharge.  Neck: Neck supple.  Neck is non-tender posteriorly, mild tenderness anteriorly  Cardiovascular: Normal rate.  An irregularly irregular rhythm present.  No murmur heard. Pulmonary/Chest: Effort normal and breath sounds normal. No stridor. No respiratory distress. She has no wheezes. She has no rales. She exhibits tenderness (mild sternal tenderness without instability).  Abdominal: Soft. Bowel sounds are normal. She exhibits no mass. There is no tenderness. There is no rebound.  Musculoskeletal: She exhibits no tenderness.  Baseline ROM, moves extremities with no obvious new focal weakness. Mild diffuse left wrist tenderness, CR is less than 2 seconds, no tenderness to the left shoulder or elbow, left leg is non-tender, right leg is only tender at the anterior knee, full extension and flexion to 90 degrees with negative Lachman's and macmurrays, no laxity with varus or valgus stress, tenderness to the olecranon aspect of the right elbow, flexes past 90 degrees, not quite full extension due to pain, right shoulder and wrist are non-tender  Lymphadenopathy:    She has no cervical adenopathy.  Neurological:  Awake, alert, cooperative and aware of situation; motor strength bilaterally; sensation normal to light touch bilaterally; peripheral visual fields full to confrontation; no facial asymmetry; tongue midline; major cranial nerves appear intact; no pronator drift, normal finger to nose bilaterally  Skin: No rash noted.  Psychiatric: She has a normal mood and affect.    ED Course  Procedures (including critical care time)  DIAGNOSTIC STUDIES: Oxygen Saturation is 99% on room air, normal by my interpretation.    COORDINATION OF CARE: 7:14  PM-Patient understands and agrees with initial ED impression and plan with expectations set for ED visit.  8:40 PM- Pt rechecked and is resting comfortably. Informed pt that all radiology reports were negative. Patient informed of clinical course, understand medical decision-making process, and agrees with plan. Will provide   9:49 PM- Informed pt of high INR and advised pt to hold the coumadin until she can f/u with her PCP. Will prescribe percocet for pain. Pt agreed. Baseline gait observed when pt ambulated to the bathroom.  Patient / Family / Caregiver informed of clinical course, understand medical decision-making process, and agree with plan.  Labs Reviewed  PROTIME-INR - Abnormal; Notable for the following:    Prothrombin Time 44.2 (*)    INR 5.31 (*)    All other components within normal limits  CBC WITH DIFFERENTIAL - Abnormal; Notable for the following:    Hemoglobin 11.7 (*)    RDW 17.1 (*)    All other components within normal limits  BASIC METABOLIC PANEL - Abnormal; Notable for the following:    Glucose, Bld 137 (*)    All other components within normal limits   Dg Chest 1 View  10/04/2012  *RADIOLOGY REPORT*  Clinical Data: Larey Seat getting out of bed, pain  CHEST - 1 VIEW  Comparison: 09/14/2011.  Findings: The heart is enlarged.  The lung fields are  clear. There is no rib fracture or pneumothorax.  Bilateral cervical ribs are seen.  Calcified tortuous aorta is noted.  Prominent central pulmonary vascular markings, possible pulmonary venous hypertension or pulmonary arterial enlargement.  IMPRESSION: Cardiomegaly without edema.  Stable chest.   Original Report Authenticated By: Davonna Belling, M.D.    Dg Elbow Complete Right  10/04/2012  *RADIOLOGY REPORT*  Clinical Data: Larey Seat, pain  RIGHT ELBOW - COMPLETE 3+ VIEW  Comparison:  None.  Findings:  There is no evidence of fracture, dislocation, or joint effusion.  There is no evidence of arthropathy or other focal bone abnormality.  Soft  tissues are unremarkable.  IMPRESSION: Negative.   Original Report Authenticated By: Davonna Belling, M.D.    Dg Wrist Complete Left  10/04/2012  *RADIOLOGY REPORT*  Clinical Data: Larey Seat, pain  LEFT WRIST - COMPLETE 3+ VIEW  Comparison: None.  Findings: There is no evidence of fracture or dislocation.  There is no evidence of arthropathy or other focal bony abnormality. Soft tissues are unremarkable.  IMPRESSION: No visible fracture.   Original Report Authenticated By: Davonna Belling, M.D.    Dg Wrist Complete Right  10/04/2012  *RADIOLOGY REPORT*  Clinical Data: Larey Seat, pain  RIGHT WRIST - COMPLETE 3+ VIEW  Comparison: None.  Findings: There is no evidence of fracture or dislocation.  There is no evidence of arthropathy or other focal bony abnormality. Soft tissues are unremarkable.  IMPRESSION: No acute findings.   Original Report Authenticated By: Davonna Belling, M.D.    Ct Head Wo Contrast  10/04/2012  *RADIOLOGY REPORT*  Clinical Data:  Larey Seat yesterday, pain in the head and neck.  CT HEAD WITHOUT CONTRAST CT CERVICAL SPINE WITHOUT CONTRAST  Technique:  Multidetector CT imaging of the head and cervical spine was performed following the standard protocol without intravenous contrast.  Multiplanar CT image reconstructions of the cervical spine were also generated.  Comparison:  Most recent 07/15/2009.  CT HEAD  Findings: No acute stroke or hemorrhage.  Premature cerebral and cerebellar atrophy.  Chronic microvascular ischemic change.  The calvarium is intact.  Chronic opacity right division sphenoid sinus, stable from priors.  Vascular calcification.  Dural calcification.  No acute orbital or mastoid findings.  IMPRESSION: Chronic changes as described.  No acute intracranial process.  CT CERVICAL SPINE  Findings: Reversal of the normal cervical lordosis.  Advanced disc space narrowing C5-6 and C6-7.  No cervical spine fracture or prevertebral soft tissue swelling.  Mild stenosis at C5-6 with bilateral foraminal narrowing.   No evidence for pneumothorax.  IMPRESSION: Spondylosis most pronounced at C5-6 and C6-7 is similar to priors. No acute cervical spine fracture.   Original Report Authenticated By: Davonna Belling, M.D.    Ct Cervical Spine Wo Contrast  10/04/2012  *RADIOLOGY REPORT*  Clinical Data:  Larey Seat yesterday, pain in the head and neck.  CT HEAD WITHOUT CONTRAST CT CERVICAL SPINE WITHOUT CONTRAST  Technique:  Multidetector CT imaging of the head and cervical spine was performed following the standard protocol without intravenous contrast.  Multiplanar CT image reconstructions of the cervical spine were also generated.  Comparison:  Most recent 07/15/2009.  CT HEAD  Findings: No acute stroke or hemorrhage.  Premature cerebral and cerebellar atrophy.  Chronic microvascular ischemic change.  The calvarium is intact.  Chronic opacity right division sphenoid sinus, stable from priors.  Vascular calcification.  Dural calcification.  No acute orbital or mastoid findings.  IMPRESSION: Chronic changes as described.  No acute intracranial process.  CT CERVICAL SPINE  Findings: Reversal of the normal cervical lordosis.  Advanced disc space narrowing C5-6 and C6-7.  No cervical spine fracture or prevertebral soft tissue swelling.  Mild stenosis at C5-6 with bilateral foraminal narrowing.  No evidence for pneumothorax.  IMPRESSION: Spondylosis most pronounced at C5-6 and C6-7 is similar to priors. No acute cervical spine fracture.   Original Report Authenticated By: Davonna Belling, M.D.    Dg Knee Complete 4 Views Right  10/04/2012  *RADIOLOGY REPORT*  Clinical Data: Larey Seat getting out of bed.  RIGHT KNEE - COMPLETE 4+ VIEW  Comparison: None.  Findings: There is no evidence of fracture or dislocation.  There is no evidence of arthropathy or other focal bony abnormality. Soft tissues are unremarkable.  IMPRESSION: No acute findings.   Original Report Authenticated By: Davonna Belling, M.D.      1. Minor head injury without loss of consciousness,  initial encounter   2. Warfarin-induced coagulopathy, initial encounter   3. Chronic anticoagulation   4. Atrial fibrillation       MDM  I personally performed the services described in this documentation, which was scribed in my presence. The recorded information has been reviewed and is accurate.       Tara Horn, MD 10/15/12 [phone]

## 2012-10-05 ENCOUNTER — Telehealth: Payer: Self-pay | Admitting: *Deleted

## 2012-10-05 NOTE — Telephone Encounter (Signed)
Patient was seen in the ED @ [facility] last night and her INR was 5.  Stated that they told her not to take her Warfarin.  Patient is scheduled to see you on Monday.  She is wandering what she needs to do about it for the rest of the weekend. / tgs

## 2012-10-05 NOTE — Telephone Encounter (Signed)
Called pt x 2 with no answer.  LMOM for pt to hold coumadin tonight, take 1/2 tablet on Saturday, 1 tablet on Sunday and recheck INR Monday 10/08/12.

## 2012-10-08 ENCOUNTER — Ambulatory Visit (INDEPENDENT_AMBULATORY_CARE_PROVIDER_SITE_OTHER): Payer: Medicare Other | Admitting: *Deleted

## 2012-10-08 DIAGNOSIS — I4891 Unspecified atrial fibrillation: Secondary | ICD-10-CM

## 2012-10-12 ENCOUNTER — Encounter: Payer: Self-pay | Admitting: Gastroenterology

## 2012-10-17 ENCOUNTER — Ambulatory Visit (INDEPENDENT_AMBULATORY_CARE_PROVIDER_SITE_OTHER): Payer: Medicare Other | Admitting: *Deleted

## 2012-10-17 DIAGNOSIS — I4891 Unspecified atrial fibrillation: Secondary | ICD-10-CM

## 2012-10-22 ENCOUNTER — Emergency Department (HOSPITAL_COMMUNITY): Payer: Medicare Other

## 2012-10-22 ENCOUNTER — Encounter (HOSPITAL_COMMUNITY): Payer: Self-pay | Admitting: *Deleted

## 2012-10-22 ENCOUNTER — Emergency Department (HOSPITAL_COMMUNITY)
Admission: EM | Admit: 2012-10-22 | Discharge: 2012-10-22 | Disposition: A | Discharge status: Home / Self Care | Payer: Medicare Other | Attending: Emergency Medicine | Admitting: Emergency Medicine

## 2012-10-22 DIAGNOSIS — Z794 Long term (current) use of insulin: Secondary | ICD-10-CM | POA: Insufficient documentation

## 2012-10-22 DIAGNOSIS — Z9861 Coronary angioplasty status: Secondary | ICD-10-CM | POA: Insufficient documentation

## 2012-10-22 DIAGNOSIS — E119 Type 2 diabetes mellitus without complications: Secondary | ICD-10-CM | POA: Insufficient documentation

## 2012-10-22 DIAGNOSIS — Z9181 History of falling: Secondary | ICD-10-CM | POA: Insufficient documentation

## 2012-10-22 DIAGNOSIS — E669 Obesity, unspecified: Secondary | ICD-10-CM | POA: Insufficient documentation

## 2012-10-22 DIAGNOSIS — E785 Hyperlipidemia, unspecified: Secondary | ICD-10-CM | POA: Insufficient documentation

## 2012-10-22 DIAGNOSIS — G4733 Obstructive sleep apnea (adult) (pediatric): Secondary | ICD-10-CM | POA: Insufficient documentation

## 2012-10-22 DIAGNOSIS — K219 Gastro-esophageal reflux disease without esophagitis: Secondary | ICD-10-CM | POA: Insufficient documentation

## 2012-10-22 DIAGNOSIS — Z9981 Dependence on supplemental oxygen: Secondary | ICD-10-CM | POA: Insufficient documentation

## 2012-10-22 DIAGNOSIS — Z8719 Personal history of other diseases of the digestive system: Secondary | ICD-10-CM | POA: Insufficient documentation

## 2012-10-22 DIAGNOSIS — I4891 Unspecified atrial fibrillation: Secondary | ICD-10-CM | POA: Insufficient documentation

## 2012-10-22 DIAGNOSIS — E039 Hypothyroidism, unspecified: Secondary | ICD-10-CM | POA: Insufficient documentation

## 2012-10-22 DIAGNOSIS — I509 Heart failure, unspecified: Secondary | ICD-10-CM | POA: Insufficient documentation

## 2012-10-22 DIAGNOSIS — Z7901 Long term (current) use of anticoagulants: Secondary | ICD-10-CM | POA: Insufficient documentation

## 2012-10-22 DIAGNOSIS — M545 Low back pain, unspecified: Secondary | ICD-10-CM | POA: Insufficient documentation

## 2012-10-22 DIAGNOSIS — Z862 Personal history of diseases of the blood and blood-forming organs and certain disorders involving the immune mechanism: Secondary | ICD-10-CM | POA: Insufficient documentation

## 2012-10-22 DIAGNOSIS — G8929 Other chronic pain: Secondary | ICD-10-CM | POA: Insufficient documentation

## 2012-10-22 DIAGNOSIS — Z8669 Personal history of other diseases of the nervous system and sense organs: Secondary | ICD-10-CM | POA: Insufficient documentation

## 2012-10-22 DIAGNOSIS — I10 Essential (primary) hypertension: Secondary | ICD-10-CM | POA: Insufficient documentation

## 2012-10-22 DIAGNOSIS — Z79899 Other long term (current) drug therapy: Secondary | ICD-10-CM | POA: Insufficient documentation

## 2012-10-22 DIAGNOSIS — Z8679 Personal history of other diseases of the circulatory system: Secondary | ICD-10-CM | POA: Insufficient documentation

## 2012-10-22 DIAGNOSIS — Z87891 Personal history of nicotine dependence: Secondary | ICD-10-CM | POA: Insufficient documentation

## 2012-10-22 LAB — CBC WITH DIFFERENTIAL/PLATELET
Eosinophils Relative: 1 % (ref 0–5)
HCT: 33.2 % — ABNORMAL LOW (ref 36.0–46.0)
Hemoglobin: 10.6 g/dL — ABNORMAL LOW (ref 12.0–15.0)
Lymphocytes Relative: 32 % (ref 12–46)
Lymphs Abs: 2.5 10*3/uL (ref 0.7–4.0)
MCV: 86.2 fL (ref 78.0–100.0)
Monocytes Relative: 14 % — ABNORMAL HIGH (ref 3–12)
Platelets: 258 10*3/uL (ref 150–400)
RBC: 3.85 MIL/uL — ABNORMAL LOW (ref 3.87–5.11)
WBC: 7.6 10*3/uL (ref 4.0–10.5)

## 2012-10-22 LAB — BASIC METABOLIC PANEL
BUN: 16 mg/dL (ref 6–23)
CO2: 22 mEq/L (ref 19–32)
Calcium: 9.2 mg/dL (ref 8.4–10.5)
Glucose, Bld: 192 mg/dL — ABNORMAL HIGH (ref 70–99)
Sodium: 137 mEq/L (ref 135–145)

## 2012-10-22 MED ORDER — HYDROCODONE-ACETAMINOPHEN 5-325 MG PO TABS
1.0000 | ORAL_TABLET | Freq: Once | ORAL | Status: AC
  Administered 2012-10-22: 1 via ORAL
  Filled 2012-10-22: qty 1

## 2012-10-22 MED ORDER — DIGOXIN 0.25 MG/ML IJ SOLN
0.2500 mg | Freq: Once | INTRAMUSCULAR | Status: AC
  Administered 2012-10-22: 0.25 mg via INTRAVENOUS
  Filled 2012-10-22: qty 2

## 2012-10-22 MED ORDER — METOPROLOL TARTRATE 25 MG PO TABS: ORAL_TABLET | ORAL | Status: DC

## 2012-10-22 MED ORDER — METOPROLOL TARTRATE 25 MG PO TABS
25.0000 mg | ORAL_TABLET | Freq: Once | ORAL | Status: AC
  Administered 2012-10-22: 25 mg via ORAL
  Filled 2012-10-22: qty 1

## 2012-10-22 NOTE — ED Notes (Signed)
Pt admits to not taking her full dose of diltiazem

## 2012-10-22 NOTE — ED Notes (Signed)
EDP made aware of pt's CP after digoxin given IV, pt denies SOB

## 2012-10-22 NOTE — ED Provider Notes (Signed)
History  This chart was scribed for Tara Lennert, MD by Greggory Stallion, ED Scribe. This patient was seen in room APA15/APA15 and the patient's care was started at 6:59 PM.   CSN: [id number]  Arrival date & time 10/22/12  1803      Chief Complaint  Patient presents with  . Fatigue    Patient is a 62 y.o. female presenting with weakness. The history is provided by the patient. No language interpreter was used.  Weakness This is a new problem. The current episode started more than 2 days ago. The problem occurs constantly. The problem has been gradually worsening. Pertinent negatives include no chest pain, no abdominal pain and no headaches. Exacerbated by: Diltiazem. Nothing relieves the symptoms. She has tried nothing for the symptoms.    [name] is a 62 y.o. female with h/o falls who presents to the Emergency Department complaining of fatigue with increased generalized weakness and associated A-fib that started 3 days ago. Pt states she was on Diltiazem and took herself off of it on Friday and states it helped with drowsiness not being on it. [name] states she has seemed more alert since she stopping taking the medication. Pt states she started taking the medication again today because she felt the weakness coming back. Last visit with PCP was in January. Pt denies fever, neck pain, sore throat, visual disturbance, CP, cough, SOB, abdominal pain, nausea, emesis, diarrhea, urinary symptoms, back pain, HA, numbness and rash as associated symptoms.    Cardiologist is [facility], Dr. Dietrich Pates   Past Medical History  Diagnosis Date  . Chest pain     Negative cardiac catheterization in 2002; negative stress nuclear study in 2008  . Congestive heart failure     Normal LV systolic function  . PAF (paroxysmal atrial fibrillation) 2009  . Chronic anticoagulation   . Diabetes mellitus, type 2     Insulin therapy; exacerbated by prednisone  . Syncope     Admitted 05/2009;  magnetic resonance imagin/ MRA - negative; etiology thought to be orthostasis secondary to drugs and dehydration  . GERD (gastroesophageal reflux disease)   . IBS (irritable bowel syndrome)   . Hiatal hernia   . Gastroparesis     99% retention 05/2008 on GES  . Hyperlipidemia   . Hypertension   . Hypothyroid   . Chronic LBP     Surgical intervention in 1996  . Obesity   . OSA on CPAP   . Anemia     H/H of 10/30 with a normal MCV in 12/09  . Barrett's esophagus     Diagnosed 1995. Last EGD 2008.     Past Surgical History  Procedure Laterality Date  . Cardiovascular stress test  2008    Stress nuclear study  . Back surgery  1996  . Laparoscopic cholecystectomy  1990s  . Total abdominal hysterectomy  1999  . Laminectomy  1995    L4-L5  . Carpal tunnel release  1994  . Esophagogastroduodenoscopy  2008    Barrett's without dysplasia. esphagus dilated. antral erosions, h.pylori serologies negative.  . Colonoscopy      Patient reports normal TCS around 10 years ago.  [employer] capsule study  12/07/2011    Procedure: GIVENS CAPSULE STUDY;  Surgeon: West Bali, MD;  Location: [facility];  Service: Endoscopy;  Laterality: N/A;  . Cardiac catheterization  2002    Family History  Problem Relation Age of Onset  . Hypertension Mother   .  Alzheimer's disease Mother   . Stroke Mother   . Heart attack Mother   . Heart disease Neg Hx   . Hypertension Other   . Colon cancer Neg Hx     History  Substance Use Topics  . Smoking status: Former Games developer  . Smokeless tobacco: Former Neurosurgeon     Comment: Remote  . Alcohol Use: No     Comment: last etoh one year ago, never frequent    OB History   Grav Para Term Preterm Abortions TAB SAB Ect Mult Living                  Review of Systems  Constitutional: Negative for appetite change and fatigue.  HENT: Negative for congestion, sinus pressure and ear discharge.   Eyes: Negative for discharge.  Respiratory: Negative for cough.    Cardiovascular: Negative for chest pain.  Gastrointestinal: Negative for abdominal pain and diarrhea.  Genitourinary: Negative for frequency and hematuria.  Musculoskeletal: Negative for back pain.  Skin: Negative for rash.  Neurological: Positive for weakness. Negative for seizures and headaches.  Psychiatric/Behavioral: Negative for hallucinations.    Allergies  Codeine  Home Medications   Current Outpatient Rx  Name  Route  Sig  Dispense  Refill  . acetaminophen (TYLENOL) 500 MG tablet   Oral   Take 1,000 mg by mouth daily as needed. For pain         . citalopram (CELEXA) 20 MG tablet   Oral   Take 20 mg by mouth every morning.          . furosemide (LASIX) 40 MG tablet   Oral   Take 40 mg by mouth daily as needed. Swelling         . glipiZIDE (GLUCOTROL) 10 MG tablet   Oral   Take 10 mg by mouth 2 (two) times daily.          . insulin glargine (LANTUS) 100 UNIT/ML injection   Subcutaneous   Inject 50 Units into the skin at bedtime. *ONLY takes when blood sugar levels exceed 200*         . levothyroxine (SYNTHROID, LEVOTHROID) 175 MCG tablet   Oral   Take 175 mcg by mouth every morning.         . loperamide (IMODIUM) 2 MG capsule   Oral   Take 2 mg by mouth 4 (four) times daily as needed. Diarrhea         . LORazepam (ATIVAN) 1 MG tablet   Oral   Take 1 mg by mouth 3 (three) times daily as needed. For anxiety         . metaxalone (SKELAXIN) 800 MG tablet   Oral   Take 800 mg by mouth daily.          . metFORMIN (GLUCOPHAGE) 500 MG tablet   Oral   Take 1,000 mg by mouth 2 (two) times daily with a meal.         . nitroGLYCERIN (NITROSTAT) 0.4 MG SL tablet   Sublingual   Place 0.4 mg under the tongue as needed. For chest pain          . omeprazole (PRILOSEC) 20 MG capsule   Oral   Take 20 mg by mouth 2 (two) times daily.           . ondansetron (ZOFRAN) 8 MG tablet   Oral   Take 8 mg by mouth daily as needed. nausea          .  oxyCODONE-acetaminophen (PERCOCET) 5-325 MG per tablet   Oral   Take 2 tablets by mouth every 6 (six) hours as needed for pain.   20 tablet   0   . potassium chloride SA (K-DUR,KLOR-CON) 20 MEQ tablet   Oral   Take 20 mEq by mouth 3 (three) times daily.          . simvastatin (ZOCOR) 40 MG tablet   Oral   Take 40 mg by mouth at bedtime.             Triage Vitals: BP 119/79  Pulse 49  Resp 24  Ht 5\' 6"  (1.676 m)  Wt 223 lb (101.152 kg)  BMI 36.01 kg/m2  SpO2 95%  Physical Exam  Nursing note and vitals reviewed. Constitutional: She is oriented to person, place, and time. She appears well-developed.  HENT:  Head: Normocephalic.  Eyes: Conjunctivae and EOM are normal. No scleral icterus.  Neck: Neck supple. No thyromegaly present.  Cardiovascular: Exam reveals no gallop and no friction rub.   No murmur heard. Irregular, rapid heart beat.  Pulmonary/Chest: No stridor. She has no wheezes. She has no rales. She exhibits no tenderness.  Abdominal: She exhibits no distension. There is no tenderness. There is no rebound.  Musculoskeletal: Normal range of motion. She exhibits no edema.  Lymphadenopathy:    She has no cervical adenopathy.  Neurological: She is oriented to person, place, and time. Coordination normal.  Skin: No rash noted. No erythema.  Psychiatric: She has a normal mood and affect. Her behavior is normal.    ED Course  Procedures (including critical care time)  DIAGNOSTIC STUDIES: Oxygen Saturation is 95% on RA, aqequate by my interpretation.    COORDINATION OF CARE: 7:07 PM-Discussed treatment plan which includes EKG, CBC, and BMP with pt at bedside and pt agreed to plan.    Labs Reviewed - No data to display No results found.  Date: 10/22/2012  Rate: 110  Rhythm: atrial fibrillation  QRS Axis: left  Intervals: normal  ST/T Wave abnormalities: nonspecific ST changes  Conduction Disutrbances:none  Narrative Interpretation:   Old EKG  Reviewed: unchanged    No diagnosis found.  I spoke with dr. Juanetta Gosling who rec.  Lopressor and follow up in am  MDM        The chart was scribed for me under my direct supervision.  I personally performed the history, physical, and medical decision making and all procedures in the evaluation of this patient.Tara Lennert, MD 10/22/12 [phone]

## 2012-10-22 NOTE — ED Notes (Signed)
Increased generalized weakness x 2 days.  States started feeling heart fluttering approx 2 hrs ago.  Denies any pain, denies sob.

## 2012-10-26 ENCOUNTER — Ambulatory Visit (INDEPENDENT_AMBULATORY_CARE_PROVIDER_SITE_OTHER): Payer: Medicare Other | Admitting: Cardiology

## 2012-10-26 ENCOUNTER — Encounter: Payer: Self-pay | Admitting: Cardiology

## 2012-10-26 VITALS — BP 102/72 | HR 78 | Ht 65.0 in | Wt 227.1 lb

## 2012-10-26 DIAGNOSIS — R251 Tremor, unspecified: Secondary | ICD-10-CM

## 2012-10-26 DIAGNOSIS — I951 Orthostatic hypotension: Secondary | ICD-10-CM

## 2012-10-26 DIAGNOSIS — R259 Unspecified abnormal involuntary movements: Secondary | ICD-10-CM

## 2012-10-26 DIAGNOSIS — G20 Parkinson's disease: Secondary | ICD-10-CM

## 2012-10-26 DIAGNOSIS — Z7901 Long term (current) use of anticoagulants: Secondary | ICD-10-CM

## 2012-10-26 DIAGNOSIS — M545 Low back pain: Secondary | ICD-10-CM

## 2012-10-26 DIAGNOSIS — D509 Iron deficiency anemia, unspecified: Secondary | ICD-10-CM

## 2012-10-26 DIAGNOSIS — I10 Essential (primary) hypertension: Secondary | ICD-10-CM

## 2012-10-26 DIAGNOSIS — I4891 Unspecified atrial fibrillation: Secondary | ICD-10-CM

## 2012-10-26 DIAGNOSIS — E039 Hypothyroidism, unspecified: Secondary | ICD-10-CM

## 2012-10-26 MED ORDER — METOPROLOL TARTRATE 25 MG PO TABS: 25.0000 mg | ORAL_TABLET | Freq: Three times a day (TID) | ORAL | Status: DC

## 2012-10-26 NOTE — Patient Instructions (Addendum)
Increase your metoprolol to 1 tablet three times a day  Your physician recommends that you schedule a follow-up appointment in: 1 week for a rhythm strip in our office  Your physician recommends that you to have lab work done : cbc   We will call you with your results  You have been given stool cards to check for the presence of blood in your stool. Please read and follow the instructions.  Your physician recommends that you schedule a follow-up appointment in: 2 months with Dr. Debbe Mounts have been referred to  [facility] to evaluate you for your tremors   Your physician has requested that you regularly monitor and record your blood pressure readings at home. Please use the same machine at the same time of day to check your readings and record them to bring to your follow-up visit.

## 2012-10-26 NOTE — Progress Notes (Deleted)
Name: [name]    DOB: 1950-11-26  Age: 62 y.o.  MR#: [id number]       PCP:  [name], MD      Insurance: Payor: [employer] MEDICARE  Plan: AARP MEDICARE COMPLETE  Product Type: *No Product type*    CC:   No chief complaint on file.  No list no medication bottles VS Filed Vitals:   10/26/12 1439  BP: 108/64  Pulse: 109  Height: 5\' 5"  (1.651 m)  Weight: 227 lb 1.9 oz (103.021 kg)    Weights Current Weight  10/26/12 227 lb 1.9 oz (103.021 kg)  10/22/12 223 lb (101.152 kg)  10/04/12 221 lb (100.245 kg)    Blood Pressure  BP Readings from Last 3 Encounters:  10/26/12 108/64  10/22/12 128/76  10/04/12 119/85     Admit date:  (Not on file) Last encounter with RMR:  05/14/2012   Allergy Codeine  Current Outpatient Prescriptions  Medication Sig Dispense Refill  . acetaminophen (TYLENOL) 500 MG tablet Take 1,000 mg by mouth daily as needed. For pain      . citalopram (CELEXA) 20 MG tablet Take 20 mg by mouth every morning.       . furosemide (LASIX) 40 MG tablet Take 40 mg by mouth daily as needed. Swelling      . glipiZIDE (GLUCOTROL) 10 MG tablet Take 10 mg by mouth 2 (two) times daily.       . insulin glargine (LANTUS) 100 UNIT/ML injection Inject 50 Units into the skin at bedtime. *ONLY takes when blood sugar levels exceed 200*      . levothyroxine (SYNTHROID, LEVOTHROID) 175 MCG tablet Take 175 mcg by mouth every morning.      . loperamide (IMODIUM) 2 MG capsule Take 2 mg by mouth 4 (four) times daily as needed. Diarrhea      . LORazepam (ATIVAN) 1 MG tablet Take 1 mg by mouth 3 (three) times daily as needed. For anxiety      . metaxalone (SKELAXIN) 800 MG tablet Take 800 mg by mouth daily.       . metFORMIN (GLUCOPHAGE) 500 MG tablet Take 1,000 mg by mouth 2 (two) times daily with a meal.      . metoprolol (LOPRESSOR) 25 MG tablet Once a day  30 tablet  0  . nitroGLYCERIN (NITROSTAT) 0.4 MG SL tablet Place 0.4 mg under the tongue as needed. For chest  pain       . omeprazole (PRILOSEC) 20 MG capsule Take 20 mg by mouth 2 (two) times daily.        . ondansetron (ZOFRAN) 8 MG tablet Take 8 mg by mouth daily as needed. nausea      . potassium chloride SA (K-DUR,KLOR-CON) 20 MEQ tablet Take 20 mEq by mouth 3 (three) times daily.       . simvastatin (ZOCOR) 40 MG tablet Take 40 mg by mouth at bedtime.       Marland Kitchen warfarin (COUMADIN) 2.5 MG tablet Take 1.25-2.5 mg by mouth daily. Alternates taking 2 tablets (5mg  total) with taking one-half tablet (1.25mg  total) daily in the evening       No current facility-administered medications for this visit.    Discontinued Meds:    Medications Discontinued During This Encounter  Medication Reason  . diltiazem (CARDIZEM) 120 MG tablet Change in therapy  . diltiazem (CARDIZEM) 120 MG tablet Change in therapy  . oxyCODONE-acetaminophen (PERCOCET) 5-325 MG per tablet Completed Course    Patient  Active Problem List  Diagnosis  . HYPOTHYROIDISM, PRIMARY  . DIABETES MELLITUS, TYPE II, ON INSULIN  . HYPERLIPIDEMIA-MIXED  . OBESITY  . ANEMIA, IRON DEFICIENCY  . HYPERTENSION  . Atrial fibrillation  . CONGESTIVE HEART FAILURE, MILD  . HYPOTENSION, ORTHOSTATIC  . LUNG NODULE  . GERD  . Barrett's esophagus  . GASTROPARESIS  . Irritable bowel syndrome  . LOW BACK PAIN, CHRONIC  . SLEEP APNEA  . CHEST PAIN-PRECORDIAL  . CARPAL TUNNEL SYNDROME, HX OF  . Chronic anticoagulation    LABS    Component Value Date/Time   NA 137 10/22/2012 1908   NA 137 10/04/2012 1916   NA 136 07/20/2012 1704   K 4.9 10/22/2012 1908   K 4.4 10/04/2012 1916   K 3.8 07/20/2012 1704   CL 104 10/22/2012 1908   CL 106 10/04/2012 1916   CL 102 07/20/2012 1704   CO2 22 10/22/2012 1908   CO2 19 10/04/2012 1916   CO2 23 07/20/2012 1704   GLUCOSE 192* 10/22/2012 1908   GLUCOSE 137* 10/04/2012 1916   GLUCOSE 129* 07/20/2012 1704   BUN 16 10/22/2012 1908   BUN 13 10/04/2012 1916   BUN 23 07/20/2012 1704   CREATININE 0.63 10/22/2012 1908    CREATININE 0.68 10/04/2012 1916   CREATININE 0.92 07/20/2012 1704   CREATININE 0.76 04/04/2012 1033   CREATININE 1.58* 09/14/2011 1435   CALCIUM 9.2 10/22/2012 1908   CALCIUM 9.2 10/04/2012 1916   CALCIUM 9.4 07/20/2012 1704   GFRNONAA >90 10/22/2012 1908   GFRNONAA >90 10/04/2012 1916   GFRNONAA 65* 07/20/2012 1704   GFRAA >90 10/22/2012 1908   GFRAA >90 10/04/2012 1916   GFRAA 75* 07/20/2012 1704   CMP     Component Value Date/Time   NA 137 10/22/2012 1908   K 4.9 10/22/2012 1908   CL 104 10/22/2012 1908   CO2 22 10/22/2012 1908   GLUCOSE 192* 10/22/2012 1908   BUN 16 10/22/2012 1908   CREATININE 0.63 10/22/2012 1908   CREATININE 0.76 04/04/2012 1033   CALCIUM 9.2 10/22/2012 1908   PROT 7.2 07/20/2012 1704   ALBUMIN 4.1 07/20/2012 1704   AST 14 07/20/2012 1704   ALT 9 07/20/2012 1704   ALKPHOS 46 07/20/2012 1704   BILITOT 0.3 07/20/2012 1704   GFRNONAA >90 10/22/2012 1908   GFRAA >90 10/22/2012 1908       Component Value Date/Time   WBC 7.6 10/22/2012 1908   WBC 8.2 10/04/2012 1916   WBC 7.6 07/20/2012 1704   HGB 10.6* 10/22/2012 1908   HGB 11.7* 10/04/2012 1916   HGB 11.6* 07/20/2012 1704   HCT 33.2* 10/22/2012 1908   HCT 36.9 10/04/2012 1916   HCT 36.5 07/20/2012 1704   MCV 86.2 10/22/2012 1908   MCV 85.4 10/04/2012 1916   MCV 82.8 07/20/2012 1704    Lipid Panel     Component Value Date/Time   CHOL 77 09/02/2011 0446   TRIG 88 09/02/2011 0446   HDL 35* 09/02/2011 0446   CHOLHDL 2.2 09/02/2011 0446   VLDL 18 09/02/2011 0446   LDLCALC 24 09/02/2011 0446    ABG    Component Value Date/Time   TCO2 18 02/11/2009 2057     Lab Results  Component Value Date   TSH 0.446 09/01/2011   BNP (last 3 results) No results found for this basename: PROBNP,  in the last 8760 hours Cardiac Panel (last 3 results) No results found for this basename: CKTOTAL, CKMB, TROPONINI, RELINDX,  in  the last 72 hours  Iron/TIBC/Ferritin    Component Value Date/Time   IRON 11* 09/02/2011 1119   TIBC 332 09/02/2011 1119   FERRITIN 9*  04/04/2012 1033     EKG Orders placed during the hospital encounter of 10/22/12  . ED EKG  . ED EKG  . EKG 12-LEAD  . EKG 12-LEAD  . ED EKG  . ED EKG  . EKG 12-LEAD  . EKG 12-LEAD  . EKG     Prior Assessment and Plan Problem List as of 10/26/2012     ICD-9-CM     Cardiology Problems   HYPERLIPIDEMIA-MIXED   HYPERTENSION   Last Assessment & Plan   09/29/2011 Office Visit Written 09/29/2011  4:37 PM by Jodelle Gross, NP     Blood pressure is controlled at present. No changes in medications.    Atrial fibrillation   Last Assessment & Plan   09/29/2011 Office Visit Written 09/29/2011  4:36 PM by Jodelle Gross, NP     Heart rate is mildly elevated today, but she rushed to get here. I rechecked it apically and found it to be in the 90s. BP check 130/90. She is feeling better and has no complaints of bleeding or dizziness. She will continue on current medications and follow-up in coumadin clinic as directed. Will see her in 6 months unless she becomes symptomatic. She is taking lasix prn for LEE with potassium replacement in conjunction with diuretic.     CONGESTIVE HEART FAILURE, MILD   Last Assessment & Plan   09/22/2011 Office Visit Written 09/22/2011  4:49 PM by Jodelle Gross, NP     Will check a BNP and CXR today. She has some scattered crackles and has gain 5 lbs since being seen last. Try to avoid admission if we can diurese as OP if necessary. Close follow-up.    HYPOTENSION, ORTHOSTATIC     Other   HYPOTHYROIDISM, PRIMARY   DIABETES MELLITUS, TYPE II, ON INSULIN   Last Assessment & Plan   09/01/2011 Clinical Support Written 09/01/2011 10:06 PM by Kathlen Brunswick, MD     Patient reports improvement in diabetic control with recent 40 pound weight loss.  She was congratulated on this achievement.    OBESITY   Last Assessment & Plan   09/01/2011 Clinical Support Written 09/01/2011 10:08 PM by Kathlen Brunswick, MD     Patient reports a 40 pound weight loss.  No  weight contained in conjunction with today's visit.    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 MAY BENEFIT FROM IRON QD. OPV IN 6 MOS.    LUNG NODULE   GERD   Last Assessment & Plan   04/04/2012 Office Visit Written 04/04/2012  9:51 AM by West Bali, MD     SX CONTROLLED WITH OMP.      Barrett's esophagus   Last Assessment & Plan   04/04/2012 Office Visit Written 04/04/2012  9:50 AM by West Bali, MD     IRREGULAR Z LINE ON EGD APR 2013. UNABLE TO APPRECIATE COLUMNAR EPITHELIUM EXTENDING ABOVE THE JXN.    GASTROPARESIS   Last Assessment & Plan   04/04/2012 Office Visit Written 04/04/2012  9:51 AM by West Bali, MD     SX CONTROLLED WITH DIET.  Irritable bowel syndrome   Last Assessment & Plan   04/04/2012 Office Visit Written 04/04/2012  9:52 AM by West Bali, MD     SX FAIRLY WELL CONTROLLED.  TAKE IMODIUM MON WED FRI. OPV IN 6 MOS.    LOW BACK PAIN, CHRONIC   SLEEP APNEA   CHEST PAIN-PRECORDIAL   Last Assessment & Plan   09/01/2011 Clinical Support Written 09/03/2011  3:53 PM by Kathlen Brunswick, MD     No recent chest discomfort.    CARPAL TUNNEL SYNDROME, HX OF   Chronic anticoagulation   Last Assessment & Plan   09/14/2011 Office Visit Written 09/14/2011  2:27 PM by Dyann Kief, PA     INR checked today.        Imaging: Dg Chest 1 View  10/04/2012  *RADIOLOGY REPORT*  Clinical Data: Larey Seat getting out of bed, pain  CHEST - 1 VIEW  Comparison: 09/14/2011.  Findings: The heart is enlarged.  The lung fields are clear. There is no rib fracture or pneumothorax.  Bilateral cervical ribs are seen.  Calcified tortuous aorta is noted.  Prominent central pulmonary vascular markings, possible pulmonary venous hypertension or pulmonary arterial enlargement.  IMPRESSION: Cardiomegaly without edema.   Stable chest.   Original Report Authenticated By: Davonna Belling, M.D.    Dg Elbow Complete Right  10/04/2012  *RADIOLOGY REPORT*  Clinical Data: Larey Seat, pain  RIGHT ELBOW - COMPLETE 3+ VIEW  Comparison:  None.  Findings:  There is no evidence of fracture, dislocation, or joint effusion.  There is no evidence of arthropathy or other focal bone abnormality.  Soft tissues are unremarkable.  IMPRESSION: Negative.   Original Report Authenticated By: Davonna Belling, M.D.    Dg Wrist Complete Left  10/04/2012  *RADIOLOGY REPORT*  Clinical Data: Larey Seat, pain  LEFT WRIST - COMPLETE 3+ VIEW  Comparison: None.  Findings: There is no evidence of fracture or dislocation.  There is no evidence of arthropathy or other focal bony abnormality. Soft tissues are unremarkable.  IMPRESSION: No visible fracture.   Original Report Authenticated By: Davonna Belling, M.D.    Dg Wrist Complete Right  10/04/2012  *RADIOLOGY REPORT*  Clinical Data: Larey Seat, pain  RIGHT WRIST - COMPLETE 3+ VIEW  Comparison: None.  Findings: There is no evidence of fracture or dislocation.  There is no evidence of arthropathy or other focal bony abnormality. Soft tissues are unremarkable.  IMPRESSION: No acute findings.   Original Report Authenticated By: Davonna Belling, M.D.    Ct Head Wo Contrast  10/04/2012  *RADIOLOGY REPORT*  Clinical Data:  Larey Seat yesterday, pain in the head and neck.  CT HEAD WITHOUT CONTRAST CT CERVICAL SPINE WITHOUT CONTRAST  Technique:  Multidetector CT imaging of the head and cervical spine was performed following the standard protocol without intravenous contrast.  Multiplanar CT image reconstructions of the cervical spine were also generated.  Comparison:  Most recent 07/15/2009.  CT HEAD  Findings: No acute stroke or hemorrhage.  Premature cerebral and cerebellar atrophy.  Chronic microvascular ischemic change.  The calvarium is intact.  Chronic opacity right division sphenoid sinus, stable from priors.  Vascular calcification.  Dural  calcification.  No acute orbital or mastoid findings.  IMPRESSION: Chronic changes as described.  No acute intracranial process.  CT CERVICAL SPINE  Findings: Reversal of the normal cervical lordosis.  Advanced disc space narrowing C5-6 and C6-7.  No cervical spine fracture or prevertebral soft tissue swelling.  Mild stenosis at C5-6 with bilateral foraminal narrowing.  No evidence for pneumothorax.  IMPRESSION: Spondylosis most pronounced at C5-6 and C6-7 is similar to priors. No acute cervical spine fracture.   Original Report Authenticated By: Davonna Belling, M.D.    Ct Cervical Spine Wo Contrast  10/04/2012  *RADIOLOGY REPORT*  Clinical Data:  Larey Seat yesterday, pain in the head and neck.  CT HEAD WITHOUT CONTRAST CT CERVICAL SPINE WITHOUT CONTRAST  Technique:  Multidetector CT imaging of the head and cervical spine was performed following the standard protocol without intravenous contrast.  Multiplanar CT image reconstructions of the cervical spine were also generated.  Comparison:  Most recent 07/15/2009.  CT HEAD  Findings: No acute stroke or hemorrhage.  Premature cerebral and cerebellar atrophy.  Chronic microvascular ischemic change.  The calvarium is intact.  Chronic opacity right division sphenoid sinus, stable from priors.  Vascular calcification.  Dural calcification.  No acute orbital or mastoid findings.  IMPRESSION: Chronic changes as described.  No acute intracranial process.  CT CERVICAL SPINE  Findings: Reversal of the normal cervical lordosis.  Advanced disc space narrowing C5-6 and C6-7.  No cervical spine fracture or prevertebral soft tissue swelling.  Mild stenosis at C5-6 with bilateral foraminal narrowing.  No evidence for pneumothorax.  IMPRESSION: Spondylosis most pronounced at C5-6 and C6-7 is similar to priors. No acute cervical spine fracture.   Original Report Authenticated By: Davonna Belling, M.D.    Dg Chest Portable 1 View  10/22/2012  *RADIOLOGY REPORT*  Clinical Data: Fatigue.   Hypertension.  CHF.  PORTABLE CHEST - 1 VIEW  Comparison: 10/04/2012  Findings: Mildly degraded exam due to AP portable technique and patient body habitus.  Numerous leads and wires project over the chest.  Midline trachea. Cardiomegaly accentuated by AP portable technique.  Aortic atherosclerosis.  Apparent right pulmonary artery prominence is favored to be technique related.  No right and no definite left pleural effusion. No pneumothorax.  Low lung volumes with resultant pulmonary interstitial prominence.  No lobar consolidation.  Linear atelectasis or scar suspected in the left midlung.  Left lung base suboptimally evaluated secondary to overlying soft tissues.  IMPRESSION: Cardiomegaly and low lung volumes, without congestive failure or acute disease.   Original Report Authenticated By: Jeronimo Greaves, M.D.    Dg Knee Complete 4 Views Right  10/04/2012  *RADIOLOGY REPORT*  Clinical Data: Larey Seat getting out of bed.  RIGHT KNEE - COMPLETE 4+ VIEW  Comparison: None.  Findings: There is no evidence of fracture or dislocation.  There is no evidence of arthropathy or other focal bony abnormality. Soft tissues are unremarkable.  IMPRESSION: No acute findings.   Original Report Authenticated By: Davonna Belling, M.D.

## 2012-10-26 NOTE — Progress Notes (Signed)
Patient ID: [name], female   DOB: February 19, 1951, 62 y.o.   MRN: [id number]  HPI: Schedule return visit for this nice woman followed by me for atrial fibrillation requiring anticoagulation, hypertension and history of congestive heart failure with preserved left ventricular systolic function.  All of those issues have been fairly well regulated in recent months. Approximately 3 weeks ago, she suffered a fall striking her right leg and arm and her chin. She has no recollection of the mechanism, but went down and she was walking from her house to her car. Evaluation in the emergency department revealed no fractures or other major sequelae. She gradually improved. She's had no recurrent dizziness and no falls nor unsteadiness of gait. Her daughter wonders whether she might have Parkinson's disease, noting some tremulousness when patient is upset or anxious.   She was seen again in the emergency department more recently when she developed palpitations. This occurred after she stopped diltiazem, as she was concerned that was causing mental clouding. Mentation has improved. Metoprolol substituted for diltiazem in the ED.  Current Outpatient Prescriptions  Medication Sig Dispense Refill  . acetaminophen (TYLENOL) 500 MG tablet Take 1,000 mg by mouth daily as needed. For pain      . citalopram (CELEXA) 20 MG tablet Take 20 mg by mouth every morning.       . furosemide (LASIX) 40 MG tablet Take 40 mg by mouth daily as needed. Swelling      . glipiZIDE (GLUCOTROL) 10 MG tablet Take 10 mg by mouth 2 (two) times daily.       . insulin glargine (LANTUS) 100 UNIT/ML injection Inject 50 Units into the skin at bedtime. *ONLY takes when blood sugar levels exceed 200*      . levothyroxine (SYNTHROID, LEVOTHROID) 175 MCG tablet Take 175 mcg by mouth every morning.      . loperamide (IMODIUM) 2 MG capsule Take 2 mg by mouth 4 (four) times daily as needed. Diarrhea      . LORazepam (ATIVAN) 1 MG tablet Take 1 mg by mouth  3 (three) times daily as needed. For anxiety      . metaxalone (SKELAXIN) 800 MG tablet Take 800 mg by mouth daily.       . metFORMIN (GLUCOPHAGE) 500 MG tablet Take 1,000 mg by mouth 2 (two) times daily with a meal.      . metoprolol (LOPRESSOR) 25 MG tablet Once a day  30 tablet  0  . nitroGLYCERIN (NITROSTAT) 0.4 MG SL tablet Place 0.4 mg under the tongue as needed. For chest pain       . omeprazole (PRILOSEC) 20 MG capsule Take 20 mg by mouth 2 (two) times daily.        . ondansetron (ZOFRAN) 8 MG tablet Take 8 mg by mouth daily as needed. nausea      . potassium chloride SA (K-DUR,KLOR-CON) 20 MEQ tablet Take 20 mEq by mouth 3 (three) times daily.       . simvastatin (ZOCOR) 40 MG tablet Take 40 mg by mouth at bedtime.       Marland Kitchen warfarin (COUMADIN) 2.5 MG tablet Take 1.25-2.5 mg by mouth daily. Alternates taking 2 tablets (5mg  total) with taking one-half tablet (1.25mg  total) daily in the evening       No current facility-administered medications for this visit.   Allergies  Allergen Reactions  . Codeine Nausea And Vomiting    HALLUCINATIONS     Past medical history, social history, and family  history reviewed and updated.  ROS: Denies chest pain, edema, orthopnea, PND. All other systems reviewed and are negative.  PHYSICAL EXAM: BP 108/64  Pulse 109  Ht 5\' 5"  (1.651 m)  Wt 103.021 kg (227 lb 1.9 oz)  BMI 37.79 kg/m2;  Body mass index is 37.79 kg/(m^2). General-Well developed; no acute distress Body habitus-proportionate weight and height Neck-No JVD; no carotid bruits Lungs-clear lung fields; resonant to percussion Cardiovascular-normal PMI; normal S1 and S2 Abdomen-normal bowel sounds; soft and non-tender without masses or organomegaly Musculoskeletal-No deformities, no cyanosis or clubbing Neurologic-Normal cranial nerves; symmetric strength and tone Skin-Warm, no significant lesions Extremities-distal pulses intact; no edema  [place], MD 10/26/2012  3:10  PM  ASSESSMENT AND PLAN

## 2012-10-27 ENCOUNTER — Encounter: Payer: Self-pay | Admitting: Cardiology

## 2012-10-27 DIAGNOSIS — R251 Tremor, unspecified: Secondary | ICD-10-CM | POA: Insufficient documentation

## 2012-10-27 LAB — CBC WITH DIFFERENTIAL/PLATELET
Eosinophils Relative: 1 % (ref 0–5)
HCT: 33 % — ABNORMAL LOW (ref 36.0–46.0)
Hemoglobin: 10.8 g/dL — ABNORMAL LOW (ref 12.0–15.0)
Lymphocytes Relative: 34 % (ref 12–46)
Lymphs Abs: 2.6 10*3/uL (ref 0.7–4.0)
MCV: 84.8 fL (ref 78.0–100.0)
Monocytes Absolute: 0.7 10*3/uL (ref 0.1–1.0)
RBC: 3.89 MIL/uL (ref 3.87–5.11)
WBC: 7.5 10*3/uL (ref 4.0–10.5)

## 2012-10-27 NOTE — Assessment & Plan Note (Signed)
Heart rate is an adequately controlled after moderate dose diltiazem discontinued and low-dose metoprolol started. I doubt that diltiazem is causing impairment of mental acuity, but treatment with metoprolol should be equally efficacious. Dose will be increased to a total of 75 mg per day and heart rate control reassessed in one week.

## 2012-10-27 NOTE — Assessment & Plan Note (Signed)
Chronic mild to moderate anemia. Managed by Dr. Darrick Penna and patient's PCP.

## 2012-10-27 NOTE — Assessment & Plan Note (Addendum)
Although no tremor is present on examination this afternoon, she does have some masking of facial emotions and some difficulty in initiating and maintaining ambulation. At the request of family, I have referred her to [facility] for evaluation.

## 2012-10-27 NOTE — Assessment & Plan Note (Signed)
Blood pressure control generally good over the past few months; patient will monitor at home and return to her next visit with a list of determinations.

## 2012-10-27 NOTE — Assessment & Plan Note (Signed)
Mild anemia persists, likely do to chronic disease although previously characterized as iron deficiency. FOBT will be repeated.

## 2012-10-27 NOTE — Assessment & Plan Note (Signed)
No orthostatic change in blood pressure at today's visit.

## 2012-10-29 ENCOUNTER — Other Ambulatory Visit: Payer: Self-pay

## 2012-10-29 DIAGNOSIS — I4891 Unspecified atrial fibrillation: Secondary | ICD-10-CM

## 2012-11-01 ENCOUNTER — Ambulatory Visit (INDEPENDENT_AMBULATORY_CARE_PROVIDER_SITE_OTHER): Payer: Medicare Other | Admitting: *Deleted

## 2012-11-01 VITALS — BP 116/78 | HR 79 | Ht 66.0 in | Wt 221.0 lb

## 2012-11-01 DIAGNOSIS — I4891 Unspecified atrial fibrillation: Secondary | ICD-10-CM

## 2012-11-01 MED ORDER — METOPROLOL TARTRATE 100 MG PO TABS: 100.0000 mg | ORAL_TABLET | Freq: Two times a day (BID) | ORAL | Status: DC

## 2012-11-01 NOTE — Patient Instructions (Addendum)
Increase your Metoprolol to 100 mg twice a day  Keep your follow-up appointment with Dr. Dietrich Pates in June

## 2012-11-02 NOTE — Progress Notes (Signed)
Pt presented to [facility] for BP check and EKG per recent medication change noted on OV 10-26-12 Pt noted in A-fib, Dr RR spoke to pt about concerns Dr Macarthur Critchley gave instructions to change pt medication as follows: Increase your Metoprolol to 100 mg twice a day  Pt understood all instructions, medication was faxed to the pharmacy manually Pt will keep f/u apt noted 12-26-12 with Dr Macarthur Critchley   Ovidio Kin LPN  Last OV VS and D/C summary 102/72 78 5\' 5"  (1.651 m) 227 lb 1.9 oz (103.021 kg) 37.79 kg/m2   Increase your metoprolol to 1 tablet three times a day  Your physician recommends that you schedule a follow-up appointment in: 1 week for a rhythm strip in our office  Your physician recommends that you to have lab work done : cbc We will call you with your results  You have been given stool cards to check for the presence of blood in your stool. Please read and follow the instructions.  Your physician recommends that you schedule a follow-up appointment in: 2 months with Dr. Debbe Mounts have been referred to [facility] to evaluate you for your tremors  Your physician has requested that you regularly monitor and record your blood pressure readings at home. Please use the same machine at the same time of day to check your readings and record them to bring to your follow-up visit.

## 2012-11-05 ENCOUNTER — Telehealth: Payer: Self-pay | Admitting: Cardiology

## 2012-11-05 ENCOUNTER — Ambulatory Visit (INDEPENDENT_AMBULATORY_CARE_PROVIDER_SITE_OTHER): Payer: Medicare Other | Admitting: *Deleted

## 2012-11-05 DIAGNOSIS — Z7901 Long term (current) use of anticoagulants: Secondary | ICD-10-CM

## 2012-11-05 NOTE — Telephone Encounter (Signed)
Pt daughter noted the high was noted on sat night, this am she noted a good number of 120/80 HR 75 at 6:30am, pt daughter noted after high BP Saturday night she re-checked again  Ten minutes and it was much lower, no other sxs of distress noted at that time, pt also notes she feels good this am, noted walked Sunday afternoon and HR was noted at 75 with BP of 130/80,advise daughter that Dr Macarthur Critchley is not in the office today however I will inform him about the concerns, was advised to bring BP cuff into office to compare per only noted one high reading for pt with no other sxs noted, pt will continue to monitor BP/HR and call office with elevations/go to ED if sxs worsen, please advise

## 2012-11-05 NOTE — Telephone Encounter (Signed)
PT DAUGHTER IS CALLING TO REPORT THAT HER BP HAS BEEN UP HIGHEST BEING 150/111 AND HR 75-115.  SHE STATES THAT SHE DOESN'T FEEL ANY DIFFERENT, BUT WAS NOT SURE WHAT TO DO.

## 2012-11-06 NOTE — Telephone Encounter (Signed)
Agree.  [place] Bing, MD

## 2012-11-07 ENCOUNTER — Encounter (HOSPITAL_COMMUNITY): Payer: Self-pay | Admitting: *Deleted

## 2012-11-07 ENCOUNTER — Inpatient Hospital Stay (HOSPITAL_COMMUNITY)
Admission: EM | Admit: 2012-11-07 | Discharge: 2012-11-10 | DRG: 101 | Disposition: A | Discharge status: Home / Self Care | Payer: Medicare Other | Attending: Family Medicine | Admitting: Family Medicine

## 2012-11-07 DIAGNOSIS — I509 Heart failure, unspecified: Secondary | ICD-10-CM | POA: Diagnosis present

## 2012-11-07 DIAGNOSIS — E785 Hyperlipidemia, unspecified: Secondary | ICD-10-CM | POA: Diagnosis present

## 2012-11-07 DIAGNOSIS — R569 Unspecified convulsions: Principal | ICD-10-CM | POA: Diagnosis present

## 2012-11-07 DIAGNOSIS — Z7901 Long term (current) use of anticoagulants: Secondary | ICD-10-CM

## 2012-11-07 DIAGNOSIS — K2270 Barrett's esophagus without dysplasia: Secondary | ICD-10-CM | POA: Diagnosis present

## 2012-11-07 DIAGNOSIS — Z87891 Personal history of nicotine dependence: Secondary | ICD-10-CM

## 2012-11-07 DIAGNOSIS — Z823 Family history of stroke: Secondary | ICD-10-CM

## 2012-11-07 DIAGNOSIS — Z794 Long term (current) use of insulin: Secondary | ICD-10-CM

## 2012-11-07 DIAGNOSIS — D509 Iron deficiency anemia, unspecified: Secondary | ICD-10-CM | POA: Diagnosis present

## 2012-11-07 DIAGNOSIS — I10 Essential (primary) hypertension: Secondary | ICD-10-CM | POA: Diagnosis present

## 2012-11-07 DIAGNOSIS — G8929 Other chronic pain: Secondary | ICD-10-CM | POA: Diagnosis present

## 2012-11-07 DIAGNOSIS — Z800 Family history of malignant neoplasm of digestive organs: Secondary | ICD-10-CM

## 2012-11-07 DIAGNOSIS — Z6836 Body mass index (BMI) 36.0-36.9, adult: Secondary | ICD-10-CM

## 2012-11-07 DIAGNOSIS — R51 Headache: Secondary | ICD-10-CM | POA: Diagnosis present

## 2012-11-07 DIAGNOSIS — Z9089 Acquired absence of other organs: Secondary | ICD-10-CM

## 2012-11-07 DIAGNOSIS — G4733 Obstructive sleep apnea (adult) (pediatric): Secondary | ICD-10-CM | POA: Diagnosis present

## 2012-11-07 DIAGNOSIS — E039 Hypothyroidism, unspecified: Secondary | ICD-10-CM | POA: Diagnosis present

## 2012-11-07 DIAGNOSIS — E669 Obesity, unspecified: Secondary | ICD-10-CM | POA: Diagnosis present

## 2012-11-07 DIAGNOSIS — E1149 Type 2 diabetes mellitus with other diabetic neurological complication: Secondary | ICD-10-CM | POA: Diagnosis present

## 2012-11-07 DIAGNOSIS — K2100 Gastro-esophageal reflux disease with esophagitis, without bleeding: Secondary | ICD-10-CM | POA: Diagnosis present

## 2012-11-07 DIAGNOSIS — I4891 Unspecified atrial fibrillation: Secondary | ICD-10-CM | POA: Diagnosis present

## 2012-11-07 DIAGNOSIS — T43205A Adverse effect of unspecified antidepressants, initial encounter: Secondary | ICD-10-CM | POA: Diagnosis present

## 2012-11-07 DIAGNOSIS — Z90710 Acquired absence of both cervix and uterus: Secondary | ICD-10-CM

## 2012-11-07 DIAGNOSIS — M545 Low back pain, unspecified: Secondary | ICD-10-CM | POA: Diagnosis present

## 2012-11-07 DIAGNOSIS — K3184 Gastroparesis: Secondary | ICD-10-CM | POA: Diagnosis present

## 2012-11-07 DIAGNOSIS — K589 Irritable bowel syndrome without diarrhea: Secondary | ICD-10-CM | POA: Diagnosis present

## 2012-11-07 DIAGNOSIS — N390 Urinary tract infection, site not specified: Secondary | ICD-10-CM | POA: Diagnosis present

## 2012-11-07 DIAGNOSIS — Z79899 Other long term (current) drug therapy: Secondary | ICD-10-CM

## 2012-11-07 DIAGNOSIS — G2401 Drug induced subacute dyskinesia: Secondary | ICD-10-CM | POA: Diagnosis present

## 2012-11-07 DIAGNOSIS — Z8249 Family history of ischemic heart disease and other diseases of the circulatory system: Secondary | ICD-10-CM

## 2012-11-07 DIAGNOSIS — I951 Orthostatic hypotension: Secondary | ICD-10-CM | POA: Diagnosis present

## 2012-11-07 DIAGNOSIS — I2510 Atherosclerotic heart disease of native coronary artery without angina pectoris: Secondary | ICD-10-CM | POA: Diagnosis present

## 2012-11-07 DIAGNOSIS — E860 Dehydration: Secondary | ICD-10-CM | POA: Diagnosis present

## 2012-11-07 DIAGNOSIS — R55 Syncope and collapse: Secondary | ICD-10-CM

## 2012-11-07 LAB — CBC WITH DIFFERENTIAL/PLATELET
Basophils Absolute: 0 10*3/uL (ref 0.0–0.1)
Eosinophils Relative: 0 % (ref 0–5)
HCT: 34.9 % — ABNORMAL LOW (ref 36.0–46.0)
Lymphocytes Relative: 19 % (ref 12–46)
Lymphs Abs: 2.1 10*3/uL (ref 0.7–4.0)
MCV: 86.2 fL (ref 78.0–100.0)
Monocytes Absolute: 0.9 10*3/uL (ref 0.1–1.0)
Monocytes Relative: 8 % (ref 3–12)
RDW: 15.5 % (ref 11.5–15.5)
WBC: 11.3 10*3/uL — ABNORMAL HIGH (ref 4.0–10.5)

## 2012-11-07 LAB — URINALYSIS, ROUTINE W REFLEX MICROSCOPIC
Glucose, UA: NEGATIVE mg/dL
Hgb urine dipstick: NEGATIVE
Ketones, ur: NEGATIVE mg/dL
pH: 5.5 (ref 5.0–8.0)

## 2012-11-07 LAB — POC HEMOCCULT BLD/STL (HOME/3-CARD/SCREEN)
Card #3 Fecal Occult Blood, POC: NEGATIVE
Fecal Occult Blood, POC: NEGATIVE

## 2012-11-07 LAB — COMPREHENSIVE METABOLIC PANEL
BUN: 14 mg/dL (ref 6–23)
CO2: 20 mEq/L (ref 19–32)
Calcium: 8.9 mg/dL (ref 8.4–10.5)
Creatinine, Ser: 0.62 mg/dL (ref 0.50–1.10)
GFR calc Af Amer: >90 mL/min (ref >90)
GFR calc non Af Amer: >90 mL/min (ref >90)
Glucose, Bld: 158 mg/dL — ABNORMAL HIGH (ref 70–99)

## 2012-11-07 LAB — URINE MICROSCOPIC-ADD ON

## 2012-11-07 LAB — TROPONIN I: Troponin I: <0.3 ng/mL (ref <0.30)

## 2012-11-07 LAB — PROTIME-INR: Prothrombin Time: 19.8 seconds — ABNORMAL HIGH (ref 11.6–15.2)

## 2012-11-07 LAB — APTT: aPTT: 28 seconds (ref 24–37)

## 2012-11-07 LAB — SAMPLE TO BLOOD BANK

## 2012-11-07 MED ORDER — ASPIRIN EC 81 MG PO TBEC
81.0000 mg | DELAYED_RELEASE_TABLET | Freq: Every day | ORAL | Status: DC
  Administered 2012-11-08 – 2012-11-10 (×3): 81 mg via ORAL
  Filled 2012-11-07 (×3): qty 1

## 2012-11-07 MED ORDER — LORAZEPAM 1 MG PO TABS
1.0000 mg | ORAL_TABLET | Freq: Three times a day (TID) | ORAL | Status: DC | PRN
  Administered 2012-11-08: 1 mg via ORAL
  Filled 2012-11-07: qty 1

## 2012-11-07 MED ORDER — PANTOPRAZOLE SODIUM 40 MG PO TBEC
40.0000 mg | DELAYED_RELEASE_TABLET | Freq: Every day | ORAL | Status: DC
  Administered 2012-11-08 – 2012-11-10 (×3): 40 mg via ORAL
  Filled 2012-11-07 (×3): qty 1

## 2012-11-07 MED ORDER — ONDANSETRON HCL 4 MG PO TABS: 4.0000 mg | ORAL_TABLET | Freq: Four times a day (QID) | ORAL | Status: DC | PRN

## 2012-11-07 MED ORDER — ONDANSETRON HCL 4 MG/2ML IJ SOLN: 4.0000 mg | Freq: Four times a day (QID) | INTRAMUSCULAR | Status: DC | PRN

## 2012-11-07 MED ORDER — LOPERAMIDE HCL 2 MG PO CAPS: 2.0000 mg | ORAL_CAPSULE | ORAL | Status: DC | PRN

## 2012-11-07 MED ORDER — INSULIN GLARGINE 100 UNIT/ML ~~LOC~~ SOLN
SUBCUTANEOUS | Status: AC
  Filled 2012-11-07: qty 10

## 2012-11-07 MED ORDER — INSULIN GLARGINE 100 UNIT/ML ~~LOC~~ SOLN
25.0000 [IU] | Freq: Every day | SUBCUTANEOUS | Status: DC
  Administered 2012-11-07 – 2012-11-09 (×3): 25 [IU] via SUBCUTANEOUS
  Filled 2012-11-07 (×5): qty 0.25

## 2012-11-07 MED ORDER — METOPROLOL TARTRATE 50 MG PO TABS
100.0000 mg | ORAL_TABLET | Freq: Two times a day (BID) | ORAL | Status: DC
  Administered 2012-11-07 – 2012-11-10 (×6): 100 mg via ORAL
  Filled 2012-11-07 (×6): qty 2

## 2012-11-07 MED ORDER — METOCLOPRAMIDE HCL 10 MG PO TABS
10.0000 mg | ORAL_TABLET | Freq: Four times a day (QID) | ORAL | Status: DC
  Administered 2012-11-07 – 2012-11-10 (×9): 10 mg via ORAL
  Filled 2012-11-07 (×9): qty 1

## 2012-11-07 MED ORDER — WARFARIN SODIUM 5 MG PO TABS
5.0000 mg | ORAL_TABLET | Freq: Once | ORAL | Status: AC
  Administered 2012-11-07: 5 mg via ORAL
  Filled 2012-11-07: qty 1

## 2012-11-07 MED ORDER — SENNA 8.6 MG PO TABS
1.0000 | ORAL_TABLET | Freq: Two times a day (BID) | ORAL | Status: DC
  Administered 2012-11-07 – 2012-11-10 (×6): 8.6 mg via ORAL
  Filled 2012-11-07 (×6): qty 1

## 2012-11-07 MED ORDER — ENOXAPARIN SODIUM 100 MG/ML ~~LOC~~ SOLN
100.0000 mg | Freq: Two times a day (BID) | SUBCUTANEOUS | Status: DC
  Administered 2012-11-07 – 2012-11-09 (×4): 100 mg via SUBCUTANEOUS
  Filled 2012-11-07 (×4): qty 1

## 2012-11-07 MED ORDER — CITALOPRAM HYDROBROMIDE 20 MG PO TABS
20.0000 mg | ORAL_TABLET | Freq: Every morning | ORAL | Status: DC
  Administered 2012-11-08 – 2012-11-09 (×2): 20 mg via ORAL
  Filled 2012-11-07 (×2): qty 1

## 2012-11-07 MED ORDER — DEXTROSE 5 % IV SOLN
1.0000 g | Freq: Once | INTRAVENOUS | Status: AC
  Administered 2012-11-07: 1 g via INTRAVENOUS
  Filled 2012-11-07: qty 10

## 2012-11-07 MED ORDER — WARFARIN - PHARMACIST DOSING INPATIENT: Freq: Every day | Status: DC

## 2012-11-07 MED ORDER — ALUM & MAG HYDROXIDE-SIMETH 200-200-20 MG/5ML PO SUSP: 30.0000 mL | Freq: Four times a day (QID) | ORAL | Status: DC | PRN

## 2012-11-07 MED ORDER — SODIUM CHLORIDE 0.9 % IJ SOLN
3.0000 mL | Freq: Two times a day (BID) | INTRAMUSCULAR | Status: DC
  Administered 2012-11-08 – 2012-11-09 (×4): 3 mL via INTRAVENOUS

## 2012-11-07 MED ORDER — LEVOTHYROXINE SODIUM 75 MCG PO TABS
175.0000 ug | ORAL_TABLET | Freq: Every day | ORAL | Status: DC
  Administered 2012-11-08 – 2012-11-10 (×3): 175 ug via ORAL
  Filled 2012-11-07 (×3): qty 1

## 2012-11-07 MED ORDER — SODIUM CHLORIDE 0.9 % IV BOLUS (SEPSIS)
1000.0000 mL | Freq: Once | INTRAVENOUS | Status: AC
  Administered 2012-11-07: 1000 mL via INTRAVENOUS

## 2012-11-07 MED ORDER — SIMVASTATIN 20 MG PO TABS
40.0000 mg | ORAL_TABLET | Freq: Every day | ORAL | Status: DC
  Administered 2012-11-07 – 2012-11-09 (×3): 40 mg via ORAL
  Filled 2012-11-07 (×2): qty 2
  Filled 2012-11-07: qty 1

## 2012-11-07 MED ORDER — OXYCODONE-ACETAMINOPHEN 5-325 MG PO TABS
1.0000 | ORAL_TABLET | Freq: Four times a day (QID) | ORAL | Status: DC | PRN
  Administered 2012-11-08 – 2012-11-09 (×2): 1 via ORAL
  Filled 2012-11-07 (×2): qty 1

## 2012-11-07 MED ORDER — INSULIN ASPART 100 UNIT/ML ~~LOC~~ SOLN
0.0000 [IU] | Freq: Three times a day (TID) | SUBCUTANEOUS | Status: DC
  Administered 2012-11-08: 3 [IU] via SUBCUTANEOUS
  Administered 2012-11-08: 5 [IU] via SUBCUTANEOUS
  Administered 2012-11-09: 3 [IU] via SUBCUTANEOUS
  Administered 2012-11-09: 5 [IU] via SUBCUTANEOUS
  Administered 2012-11-09: 3 [IU] via SUBCUTANEOUS
  Administered 2012-11-10: 2 [IU] via SUBCUTANEOUS

## 2012-11-07 MED ORDER — SODIUM CHLORIDE 0.9 % IV SOLN
INTRAVENOUS | Status: AC
  Administered 2012-11-07: 100 mL via INTRAVENOUS

## 2012-11-07 NOTE — ED Provider Notes (Signed)
History    This chart was scribed for Ward Givens, MD by Leone Payor, ED Scribe. This patient was seen in room [facility] and the patient's care was started 2:30 PM.   CSN: [id number]  Arrival date & time 11/07/12  1320   First MD Initiated Contact with Patient 11/07/12 1352      Chief Complaint  Patient presents with  . Weakness     The history is provided by the patient. No language interpreter was used.    HPI Comments: [name] is a 62 y.o. female who presents to the Emergency Department complaining of weakness starting earlier today. She was at the beauty salon and suddenly felt hot and her knees began to shake and buckle. She did not LOC or fall at that time. She reported she had gotten a permanent in her hair and had been sitting under the hair dryer. She started sweating when she was under the hair dryer and then when they were coming out her hair. It was when she stood up to leave the beauty she will on that she started feeling lightheaded and "woozy". She was laid down on the floor by the hairstylist and her family member. She had some associated lightheadedness, mild shortness of breath mild HA that is gone now, diaphoresis, palpitations. Pt has a h/o atrial fibrillation and felt it was worse today. She denies chest pain, black or tarry stools. Pt has had a cardiac catheterization previously over 10 years ago that was normal.. She has also had a cardioversion for atrial fibrillation about 2 years ago. Pt was checked for blood thin-ness last week and the result was 1.7. Pt reports taking all of her required medications today. She has felt similar to this in the past but this episode was more aggressive. She felt her R arm curl on itself and had difficulty straightening it out.  She has had a blood transfusion last year. Pt has 3 negative outpatient hemoccults that resulted today. Daughter reports patient had a normal head CT done in the past month. Pt is a former smoker but denies  alcohol use.   PCP Dr [name] Cardiologist Dr Dietrich Pates  Past Medical History  Diagnosis Date  . Chest pain     Negative cardiac catheterization in 2002; negative stress nuclear study in 2008  . Congestive heart failure     Normal LV systolic function  . PAF (paroxysmal atrial fibrillation) 2009  . Chronic anticoagulation   . Diabetes mellitus, type 2     Insulin therapy; exacerbated by prednisone  . Syncope     Admitted 05/2009; magnetic resonance imagin/ MRA - negative; etiology thought to be orthostasis secondary to drugs and dehydration  . GERD (gastroesophageal reflux disease)   . IBS (irritable bowel syndrome)   . Hiatal hernia   . Gastroparesis     99% retention 05/2008 on GES  . Hyperlipidemia   . Hypertension   . Hypothyroid   . Chronic LBP     Surgical intervention in 1996  . Obesity   . OSA on CPAP   . Anemia     H/H of 10/30 with a normal MCV in 12/09  . Barrett's esophagus     Diagnosed 1995. Last EGD 2008.     Past Surgical History  Procedure Laterality Date  . Cardiovascular stress test  2008    Stress nuclear study  . Back surgery  1996  . Laparoscopic cholecystectomy  1990s  . Total abdominal hysterectomy  1999  .  Laminectomy  1995    L4-L5  . Carpal tunnel release  1994  . Esophagogastroduodenoscopy  2008    Barrett's without dysplasia. esphagus dilated. antral erosions, h.pylori serologies negative.  . Colonoscopy  2013    Patient reports normal TCS around 10 years ago; and a repeat study in 2013 that was negative  . Givens capsule study  12/07/2011    Procedure: GIVENS CAPSULE STUDY;  Surgeon: West Bali, MD;  Location: [facility];  Service: Endoscopy;  Laterality: N/A;  . Cardiac catheterization  2002    Family History  Problem Relation Age of Onset  . Hypertension Mother   . Alzheimer's disease Mother   . Stroke Mother   . Heart attack Mother   . Heart disease Neg Hx   . Hypertension Other   . Colon cancer Neg Hx     History   Substance Use Topics  . Smoking status: Former Games developer  . Smokeless tobacco: Former Neurosurgeon     Comment: Remote  . Alcohol Use: No     Comment: last etoh one year ago, never frequent    OB History   Grav Para Term Preterm Abortions TAB SAB Ect Mult Living                  Review of Systems  Constitutional: Positive for diaphoresis.  Cardiovascular: Positive for palpitations.  Gastrointestinal: Negative for blood in stool.  Neurological: Positive for weakness and light-headedness.  All other systems reviewed and are negative.    Allergies  Codeine  Home Medications   Current Outpatient Rx  Name  Route  Sig  Dispense  Refill  . acetaminophen (TYLENOL) 500 MG tablet   Oral   Take 1,000 mg by mouth daily as needed. For pain         . citalopram (CELEXA) 20 MG tablet   Oral   Take 20 mg by mouth every morning.          . furosemide (LASIX) 40 MG tablet   Oral   Take 40 mg by mouth daily as needed. Swelling         . glipiZIDE (GLUCOTROL) 10 MG tablet   Oral   Take 10 mg by mouth 2 (two) times daily.          . insulin glargine (LANTUS) 100 UNIT/ML injection   Subcutaneous   Inject 50 Units into the skin at bedtime. *ONLY takes when blood sugar levels exceed 200*         . levothyroxine (SYNTHROID, LEVOTHROID) 175 MCG tablet   Oral   Take 175 mcg by mouth every morning.         . loperamide (IMODIUM) 2 MG capsule   Oral   Take 2 mg by mouth 4 (four) times daily as needed. Diarrhea         . LORazepam (ATIVAN) 1 MG tablet   Oral   Take 1 mg by mouth 3 (three) times daily as needed. For anxiety         . metaxalone (SKELAXIN) 800 MG tablet   Oral   Take 800 mg by mouth daily.          . metFORMIN (GLUCOPHAGE) 500 MG tablet   Oral   Take 1,000 mg by mouth 2 (two) times daily with a meal.         . metoCLOPramide (REGLAN) 10 MG tablet   Oral   Take 10 mg by mouth 4 (four) times daily.          Marland Kitchen  metoprolol tartrate (LOPRESSOR) 100 MG  tablet   Oral   Take 1 tablet (100 mg total) by mouth 2 (two) times daily.   60 tablet   6   . nitroGLYCERIN (NITROSTAT) 0.4 MG SL tablet   Sublingual   Place 0.4 mg under the tongue as needed. For chest pain          . omeprazole (PRILOSEC) 20 MG capsule   Oral   Take 20 mg by mouth 2 (two) times daily.           . ondansetron (ZOFRAN) 8 MG tablet   Oral   Take 8 mg by mouth daily as needed. nausea         . oxyCODONE-acetaminophen (PERCOCET/ROXICET) 5-325 MG per tablet   Oral   Take 1 tablet by mouth every 6 (six) hours as needed for pain.          . potassium chloride SA (K-DUR,KLOR-CON) 20 MEQ tablet   Oral   Take 20 mEq by mouth 3 (three) times daily.          . simvastatin (ZOCOR) 40 MG tablet   Oral   Take 40 mg by mouth at bedtime.          Marland Kitchen warfarin (COUMADIN) 2.5 MG tablet   Oral   Take 1.25-2.5 mg by mouth daily. 2.5 mg on Tues, Thurs, and Sat. 1.25 on Mon, Wed, Fri and Sun.           ED Triage Vitals  Enc Vitals Group     BP 11/07/12 1324 135/65 mmHg     Pulse Rate 11/07/12 1324 91     Resp 11/07/12 1324 18     Temp 11/07/12 1324 98.3 F (36.8 C)     Temp src 11/07/12 1324 Oral     SpO2 11/07/12 1324 99 %     Weight 11/07/12 1324 221 lb (100.245 kg)     Height 11/07/12 1324 5\' 6"  (1.676 m)     Head Cir --      Peak Flow --      Pain Score --      Pain Loc --      Pain Edu? --      Excl. in GC? --    Vital signs normal   Orthostatic vital signs were positive with pulse increasing from 98-123 on standing and blood pressure dropping from 123 lying to  118 on standing.   Physical Exam  Nursing note and vitals reviewed. Constitutional: She is oriented to person, place, and time. She appears well-developed and well-nourished.  Non-toxic appearance. She does not appear ill. No distress.  HENT:  Head: Normocephalic and atraumatic.  Right Ear: External ear normal.  Left Ear: External ear normal.  Nose: Nose normal. No mucosal edema  or rhinorrhea.  Mouth/Throat: Oropharynx is clear and moist and mucous membranes are normal. No dental abscesses or edematous.  Eyes: Conjunctivae and EOM are normal. Pupils are equal, round, and reactive to light.  Pale sclera.   Neck: Normal range of motion and full passive range of motion without pain. Neck supple.  Cardiovascular: Normal heart sounds.  An irregular rhythm present. Tachycardia present.  Exam reveals no gallop and no friction rub.   No murmur heard. Pulmonary/Chest: Effort normal and breath sounds normal. No respiratory distress. She has no wheezes. She has no rhonchi. She has no rales. She exhibits no tenderness and no crepitus.  Abdominal: Soft. Normal appearance and bowel sounds are normal.  She exhibits no distension. There is no tenderness. There is no rebound and no guarding.  Musculoskeletal: Normal range of motion. She exhibits no edema and no tenderness.  Moves all extremities well.    Neurological: She is alert and oriented to person, place, and time. She has normal strength. No cranial nerve deficit.  No pronator drift, no focal motor weakness  Skin: Skin is warm, dry and intact. No rash noted. No erythema. There is pallor.  Palms are pale  Psychiatric: She has a normal mood and affect. Her speech is normal and behavior is normal. Her mood appears not anxious.    ED Course  Procedures (including critical care time) Medications  cefTRIAXone (ROCEPHIN) 1 g in dextrose 5 % 50 mL IVPB (not administered)  sodium chloride 0.9 % bolus 1,000 mL (1,000 mLs Intravenous New Bag/Given 11/07/12 1702)     DIAGNOSTIC STUDIES: Oxygen Saturation is 99% on room air, normal by my interpretation.    COORDINATION OF CARE: 2:31 PM Discussed treatment plan with pt at bedside and pt agreed to plan.   Pt had + orthostatic VS she was given IV fluids. She was given IV rocephin for possible UTI.   17:53 Dr [name], admit to tele, team 1  Results for orders placed during the  hospital encounter of 11/07/12  GLUCOSE, CAPILLARY      Result Value Range   Glucose-Capillary 162 (*) 70 - 99 mg/dL   Comment 1 Documented in Chart     Comment 2 Notify RN    CBC WITH DIFFERENTIAL      Result Value Range   WBC 11.3 (*) 4.0 - 10.5 K/uL   RBC 4.05  3.87 - 5.11 MIL/uL   Hemoglobin 11.2 (*) 12.0 - 15.0 g/dL   HCT 29.5 (*) 28.4 - 13.2 %   MCV 86.2  78.0 - 100.0 fL   MCH 27.7  26.0 - 34.0 pg   MCHC 32.1  30.0 - 36.0 g/dL   RDW 44.0  10.2 - 72.5 %   Platelets 228  150 - 400 K/uL   Neutrophils Relative 73  43 - 77 %   Neutro Abs 8.3 (*) 1.7 - 7.7 K/uL   Lymphocytes Relative 19  12 - 46 %   Lymphs Abs 2.1  0.7 - 4.0 K/uL   Monocytes Relative 8  3 - 12 %   Monocytes Absolute 0.9  0.1 - 1.0 K/uL   Eosinophils Relative 0  0 - 5 %   Eosinophils Absolute 0.0  0.0 - 0.7 K/uL   Basophils Relative 0  0 - 1 %   Basophils Absolute 0.0  0.0 - 0.1 K/uL  COMPREHENSIVE METABOLIC PANEL      Result Value Range   Sodium 139  135 - 145 mEq/L   Potassium 4.3  3.5 - 5.1 mEq/L   Chloride 106  96 - 112 mEq/L   CO2 20  19 - 32 mEq/L   Glucose, Bld 158 (*) 70 - 99 mg/dL   BUN 14  6 - 23 mg/dL   Creatinine, Ser 3.66  0.50 - 1.10 mg/dL   Calcium 8.9  8.4 - 44.0 mg/dL   Total Protein 6.7  6.0 - 8.3 g/dL   Albumin 3.7  3.5 - 5.2 g/dL   AST 13  0 - 37 U/L   ALT 12  0 - 35 U/L   Alkaline Phosphatase 60  39 - 117 U/L   Total Bilirubin 0.4  0.3 - 1.2 mg/dL  GFR calc non Af Amer >90  >90 mL/min   GFR calc Af Amer >90  >90 mL/min  APTT      Result Value Range   aPTT 28  24 - 37 seconds  PROTIME-INR      Result Value Range   Prothrombin Time 19.8 (*) 11.6 - 15.2 seconds   INR 1.75 (*) 0.00 - 1.49  TROPONIN I      Result Value Range   Troponin I <0.30  <0.30 ng/mL  URINALYSIS, ROUTINE W REFLEX MICROSCOPIC      Result Value Range   Color, Urine YELLOW  YELLOW   APPearance CLOUDY (*) CLEAR   Specific Gravity, Urine >1.030 (*) 1.005 - 1.030   pH 5.5  5.0 - 8.0   Glucose, UA NEGATIVE   NEGATIVE mg/dL   Hgb urine dipstick NEGATIVE  NEGATIVE   Bilirubin Urine NEGATIVE  NEGATIVE   Ketones, ur NEGATIVE  NEGATIVE mg/dL   Protein, ur TRACE (*) NEGATIVE mg/dL   Urobilinogen, UA 0.2  0.0 - 1.0 mg/dL   Nitrite NEGATIVE  NEGATIVE   Leukocytes, UA SMALL (*) NEGATIVE  URINE MICROSCOPIC-ADD ON      Result Value Range   Squamous Epithelial / LPF RARE  RARE   WBC, UA 21-50  <3 WBC/hpf   Bacteria, UA MANY (*) RARE  SAMPLE TO BLOOD BANK      Result Value Range   Sample Expiration 11/10/2012     Laboratory interpretation all normal except leukocytosis, concentrated urine c/w dehydration, subtherapeutic INR, hyperglycemia   Dg Chest Portable 1 View  10/22/2012  *RADIOLOGY REPORT*  Clinical Data: Fatigue.  Hypertension.  CHF.  PORTABLE CHEST - 1 VIEW  Comparison: 10/04/2012  Findings: Mildly degraded exam due to AP portable technique and patient body habitus.  Numerous leads and wires project over the chest.  Midline trachea. Cardiomegaly accentuated by AP portable technique.  Aortic atherosclerosis.  Apparent right pulmonary artery prominence is favored to be technique related.  No right and no definite left pleural effusion. No pneumothorax.  Low lung volumes with resultant pulmonary interstitial prominence.  No lobar consolidation.  Linear atelectasis or scar suspected in the left midlung.  Left lung base suboptimally evaluated secondary to overlying soft tissues.  IMPRESSION: Cardiomegaly and low lung volumes, without congestive failure or acute disease.   Original Report Authenticated By: Jeronimo Greaves, M.D.        10/04/2012 RADIOLOGY REPORT*  Clinical Data: Larey Seat yesterday, pain in the head and neck.   CT HEAD  Findings: No acute stroke or hemorrhage. Premature cerebral and  cerebellar atrophy. Chronic microvascular ischemic change. The  calvarium is intact. Chronic opacity right division sphenoid  sinus, stable from priors. Vascular calcification. Dural  calcification. No acute  orbital or mastoid findings.  IMPRESSION:  Chronic changes as described. No acute intracranial process.   CT CERVICAL SPINE  Findings: Reversal of the normal cervical lordosis. Advanced disc  space narrowing C5-6 and C6-7. No cervical spine fracture or  prevertebral soft tissue swelling. Mild stenosis at C5-6 with  bilateral foraminal narrowing. No evidence for pneumothorax.  IMPRESSION:  Spondylosis most pronounced at C5-6 and C6-7 is similar to priors.  No acute cervical spine fracture.  Original Report Authenticated By: Davonna Belling, M.D.        Date: 11/07/2012  Rate: 99  Rhythm: atrial fibrillation  QRS Axis: normal  Intervals: QT prolonged  ST/T Wave abnormalities: nonspecific ST/T changes  Conduction Disutrbances:none  Narrative Interpretation:   Old EKG  Reviewed: unchanged from 10/22/2012    1. Near syncope   2. Orthostasis   3. Atrial fibrillation   4. Dehydration   5. UTI (lower urinary tract infection)     Plan admission   Devoria Albe, MD, FACEP   MDM   I personally performed the services described in this documentation, which was scribed in my presence. The recorded information has been reviewed and considered.  Devoria Albe, MD, FACEP      Ward Givens, MD 11/07/12 Zollie Pee

## 2012-11-07 NOTE — ED Notes (Signed)
Pt was at beauty salon and felt hot suddenly. Pt states weakness and woozy feeling.  No neuro deficits noted per EMS. States medications have recently been changed last Friday, diltiazem and lopressor.

## 2012-11-07 NOTE — Progress Notes (Signed)
ANTICOAGULATION CONSULT NOTE - Initial Consult  Pharmacy Consult for coumadin and lovenox Indication: atrial fibrillation  Allergies  Allergen Reactions  . Codeine Nausea And Vomiting    HALLUCINATIONS    Patient Measurements: Height: 5\' 6"  (167.6 cm) Weight: 221 lb (100.245 kg) IBW/kg (Calculated) : 59.3  Vital Signs: Temp: 98.3 F (36.8 C) (05/07 1324) Temp src: Oral (05/07 1324) BP: 151/81 mmHg (05/07 2000) Pulse Rate: 62 (05/07 2000)  Labs:  Recent Labs  11/07/12 1525  HGB 11.2*  HCT 34.9*  PLT 228  APTT 28  LABPROT 19.8*  INR 1.75*  CREATININE 0.62  TROPONINI <0.30    Estimated Creatinine Clearance: 86 ml/min (by C-G formula based on Cr of 0.62).   Medical History: Past Medical History  Diagnosis Date  . Chest pain     Negative cardiac catheterization in 2002; negative stress nuclear study in 2008  . Congestive heart failure     Normal LV systolic function  . PAF (paroxysmal atrial fibrillation) 2009  . Chronic anticoagulation   . Diabetes mellitus, type 2     Insulin therapy; exacerbated by prednisone  . Syncope     Admitted 05/2009; magnetic resonance imagin/ MRA - negative; etiology thought to be orthostasis secondary to drugs and dehydration  . GERD (gastroesophageal reflux disease)   . IBS (irritable bowel syndrome)   . Hiatal hernia   . Gastroparesis     99% retention 05/2008 on GES  . Hyperlipidemia   . Hypertension   . Hypothyroid   . Chronic LBP     Surgical intervention in 1996  . Obesity   . OSA on CPAP   . Anemia     H/H of 10/30 with a normal MCV in 12/09  . Barrett's esophagus     Diagnosed 1995. Last EGD 2008.     Assessment: Pt on chronic coumadin for afib.  INR subtherapeutic on admission.  Good renal fxn. Goal of Therapy:  INR 2-3    Plan:  Coumadin 5mg  x1 lovenox 1mg /kg sq q12hrs INR daily  Valrie Hart A 11/07/2012,9:43 PM

## 2012-11-08 ENCOUNTER — Inpatient Hospital Stay (HOSPITAL_COMMUNITY): Payer: Medicare Other

## 2012-11-08 ENCOUNTER — Encounter (HOSPITAL_COMMUNITY): Payer: Self-pay

## 2012-11-08 DIAGNOSIS — R55 Syncope and collapse: Secondary | ICD-10-CM

## 2012-11-08 LAB — BASIC METABOLIC PANEL
BUN: 13 mg/dL (ref 6–23)
Creatinine, Ser: 0.61 mg/dL (ref 0.50–1.10)
GFR calc Af Amer: >90 mL/min (ref >90)
GFR calc non Af Amer: >90 mL/min (ref >90)

## 2012-11-08 LAB — CBC
HCT: 32.2 % — ABNORMAL LOW (ref 36.0–46.0)
MCHC: 32 g/dL (ref 30.0–36.0)
RDW: 15.6 % — ABNORMAL HIGH (ref 11.5–15.5)

## 2012-11-08 LAB — GLUCOSE, CAPILLARY
Glucose-Capillary: 226 mg/dL — ABNORMAL HIGH (ref 70–99)
Glucose-Capillary: 151 mg/dL — ABNORMAL HIGH (ref 70–99)

## 2012-11-08 LAB — TSH: TSH: 1.773 u[IU]/mL (ref 0.350–4.500)

## 2012-11-08 LAB — TROPONIN I: Troponin I: <0.3 ng/mL (ref <0.30)

## 2012-11-08 LAB — HEMOGLOBIN A1C: Hgb A1c MFr Bld: 6.7 % — ABNORMAL HIGH (ref <5.7)

## 2012-11-08 LAB — PROTIME-INR: INR: 1.7 — ABNORMAL HIGH (ref 0.00–1.49)

## 2012-11-08 MED ORDER — WARFARIN - PHARMACIST DOSING INPATIENT
Status: DC
  Administered 2012-11-08: 17:00:00
  Administered 2012-11-09: 1

## 2012-11-08 MED ORDER — LEVETIRACETAM 500 MG PO TABS
250.0000 mg | ORAL_TABLET | Freq: Two times a day (BID) | ORAL | Status: DC
  Administered 2012-11-08 – 2012-11-10 (×4): 250 mg via ORAL
  Filled 2012-11-08 (×3): qty 1
  Filled 2012-11-08: qty 2

## 2012-11-08 MED ORDER — WARFARIN SODIUM 5 MG PO TABS
5.0000 mg | ORAL_TABLET | Freq: Once | ORAL | Status: AC
  Administered 2012-11-08: 5 mg via ORAL
  Filled 2012-11-08: qty 1

## 2012-11-08 NOTE — H&P (Signed)
[facility] History and Physical  [name] MVH:[id number] DOB: 1951/04/14 DOA: 11/07/2012  Referring physician: Weber Cooks PCP: [name], MD  Specialists: Cardiology [place]  Chief Complaint: Near Syncope, Weakness  HPI: [name] is a 62 y.o. female with multiple chronic problems including atrial fibrillation coronary artery disease, type 2 diabetes and sleep apnea who came into the emergency department complaining of acute onset severe generalized weakness. This started today while she was at the beauty salon, she was under a hairdryer and got very nauseated hot and dizzy feeling, when cooled off she had difficulty standing without severe dizziness and loss of balance. She also noted that her right arm contracted/flexed toward her body involuntarily and she lost control of it for a few minutes during the episode. She also felt like her heart was racing very fast and she was mildly short of breath. She is on insulin for her diabetes but has not had a low blood sugar in quite some time and was normal at the time of her arrival. She denied any chest pain, vomiting, loss of vision, memory loss, seizure, loss of bowel or bladder, no fall or trauma. She was in A. fib with RVR upon arrival to the emergency room.  Review of Systems: The patient denies anorexia, fever, weight loss,, vision loss, decreased hearing, hoarseness, chest pain, syncope, dyspnea on exertion, peripheral edema, hemoptysis, abdominal pain, melena, hematochezia, severe indigestion/heartburn, hematuria, incontinence, genital sores, muscle weakness, suspicious skin lesions, transient blindness, depression, unusual weight change, abnormal bleeding, enlarged lymph nodes, angioedema, and breast masses.    Past Medical History  Diagnosis Date  . Chest pain     Negative cardiac catheterization in 2002; negative stress nuclear study in 2008  . Congestive heart failure     Normal LV systolic function  . PAF  (paroxysmal atrial fibrillation) 2009  . Chronic anticoagulation   . Diabetes mellitus, type 2     Insulin therapy; exacerbated by prednisone  . Syncope     Admitted 05/2009; magnetic resonance imagin/ MRA - negative; etiology thought to be orthostasis secondary to drugs and dehydration  . GERD (gastroesophageal reflux disease)   . IBS (irritable bowel syndrome)   . Hiatal hernia   . Gastroparesis     99% retention 05/2008 on GES  . Hyperlipidemia   . Hypertension   . Hypothyroid   . Chronic LBP     Surgical intervention in 1996  . Obesity   . OSA on CPAP   . Anemia     H/H of 10/30 with a normal MCV in 12/09  . Barrett's esophagus     Diagnosed 1995. Last EGD 2008.    Past Surgical History  Procedure Laterality Date  . Cardiovascular stress test  2008    Stress nuclear study  . Back surgery  1996  . Laparoscopic cholecystectomy  1990s  . Total abdominal hysterectomy  1999  . Laminectomy  1995    L4-L5  . Carpal tunnel release  1994  . Esophagogastroduodenoscopy  2008    Barrett's without dysplasia. esphagus dilated. antral erosions, h.pylori serologies negative.  . Colonoscopy  2013    Patient reports normal TCS around 10 years ago; and a repeat study in 2013 that was negative  . Givens capsule study  12/07/2011    Procedure: GIVENS CAPSULE STUDY;  Surgeon: West Bali, MD;  Location: [facility];  Service: Endoscopy;  Laterality: N/A;  . Cardiac catheterization  2002   Social History:  reports  that she has quit smoking. She has quit using smokeless tobacco. She reports that she does not drink alcohol or use illicit drugs.  Allergies  Allergen Reactions  . Codeine Nausea And Vomiting    HALLUCINATIONS    Family History  Problem Relation Age of Onset  . Hypertension Mother   . Alzheimer's disease Mother   . Stroke Mother   . Heart attack Mother   . Heart disease Neg Hx   . Hypertension Other   . Colon cancer Neg Hx     Prior to Admission medications    Medication Sig Start Date End Date Taking? Authorizing Provider  acetaminophen (TYLENOL) 500 MG tablet Take 1,000 mg by mouth daily as needed. For pain   Yes Historical Provider, MD  citalopram (CELEXA) 20 MG tablet Take 20 mg by mouth every morning.    Yes Historical Provider, MD  furosemide (LASIX) 40 MG tablet Take 40 mg by mouth daily as needed. Swelling 11/01/11  Yes Historical Provider, MD  glipiZIDE (GLUCOTROL) 10 MG tablet Take 10 mg by mouth 2 (two) times daily.    Yes Historical Provider, MD  insulin glargine (LANTUS) 100 UNIT/ML injection Inject 50 Units into the skin at bedtime. *ONLY takes when blood sugar levels exceed 200*   Yes Historical Provider, MD  levothyroxine (SYNTHROID, LEVOTHROID) 175 MCG tablet Take 175 mcg by mouth every morning.   Yes Historical Provider, MD  loperamide (IMODIUM) 2 MG capsule Take 2 mg by mouth 4 (four) times daily as needed. Diarrhea   Yes Historical Provider, MD  LORazepam (ATIVAN) 1 MG tablet Take 1 mg by mouth 3 (three) times daily as needed. For anxiety   Yes Historical Provider, MD  metaxalone (SKELAXIN) 800 MG tablet Take 800 mg by mouth daily.    Yes Historical Provider, MD  metFORMIN (GLUCOPHAGE) 500 MG tablet Take 1,000 mg by mouth 2 (two) times daily with a meal.   Yes Historical Provider, MD  metoCLOPramide (REGLAN) 10 MG tablet Take 10 mg by mouth 4 (four) times daily.  09/20/12  Yes Historical Provider, MD  metoprolol tartrate (LOPRESSOR) 100 MG tablet Take 1 tablet (100 mg total) by mouth 2 (two) times daily. 11/01/12  Yes Kathlen Brunswick, MD  nitroGLYCERIN (NITROSTAT) 0.4 MG SL tablet Place 0.4 mg under the tongue as needed. For chest pain    Yes Historical Provider, MD  omeprazole (PRILOSEC) 20 MG capsule Take 20 mg by mouth 2 (two) times daily.     Yes Historical Provider, MD  ondansetron (ZOFRAN) 8 MG tablet Take 8 mg by mouth daily as needed. nausea 11/01/11  Yes Historical Provider, MD  oxyCODONE-acetaminophen (PERCOCET/ROXICET) 5-325  MG per tablet Take 1 tablet by mouth every 6 (six) hours as needed for pain.  10/12/12  Yes Historical Provider, MD  potassium chloride SA (K-DUR,KLOR-CON) 20 MEQ tablet Take 20 mEq by mouth 3 (three) times daily.    Yes Historical Provider, MD  simvastatin (ZOCOR) 40 MG tablet Take 40 mg by mouth at bedtime.    Yes Historical Provider, MD  warfarin (COUMADIN) 2.5 MG tablet Take 1.25-2.5 mg by mouth daily. 2.5 mg on Tues, Thurs, and Sat. 1.25 on Mon, Wed, Fri and Sun.   Yes Historical Provider, MD   Physical Exam: Filed Vitals:   11/07/12 1900 11/07/12 2000 11/08/12 0029 11/08/12 0427  BP: 117/96 151/81  140/81  Pulse: 78 62  75  Temp:    98.4 F (36.9 C)  TempSrc:    Oral  Resp: 22 19  18   Height:   5\' 6"  (1.676 m)   Weight:   101.152 kg (223 lb)   SpO2: 98% 96%  99%     General:  Chronically ill-appearing woman, pleasant talkative, not a reliable history  Eyes: Normal  ENT: Normal tongue is midline, no facial droop  Neck: Supple no thyromegaly, no JVD  Cardiovascular: Irregular, rate normal, no murmurs rubs or gallops  Respiratory: Scattered crackles but good air movement and no wheezing  Abdomen: Obese, nontender no masses active bowel sounds  Skin: No rashes, lesions or soft tissue injuries  Musculoskeletal: Moves all 4 extremities equally  Psychiatric: Appropriate affect Neurologic: Nonfocal exam, equal strength in her upper extremities and lower extremities, no gross sensory loss, reflexes are normal, eye movements are normal, difficulty with proprioception including point-to-point and evidence of  Coordination difficulties is present. She ambulated to the bathroom but required quite a bit of assistance from her daughter she does seem unsteady on her feet. Does not lean to one side or another.  Labs on Admission:  Basic Metabolic Panel:  Recent Labs Lab 11/07/12 1525 11/08/12 0319  NA 139 138  K 4.3 4.0  CL 106 107  CO2 20 22  GLUCOSE 158* 187*  BUN 14 13   CREATININE 0.62 0.61  CALCIUM 8.9 8.8   Liver Function Tests:  Recent Labs Lab 11/07/12 1525  AST 13  ALT 12  ALKPHOS 60  BILITOT 0.4  PROT 6.7  ALBUMIN 3.7   No results found for this basename: LIPASE, AMYLASE,  in the last 168 hours No results found for this basename: AMMONIA,  in the last 168 hours CBC:  Recent Labs Lab 11/07/12 1525 11/08/12 0319  WBC 11.3* 8.5  NEUTROABS 8.3*  --   HGB 11.2* 10.3*  HCT 34.9* 32.2*  MCV 86.2 86.1  PLT 228 194   Cardiac Enzymes:  Recent Labs Lab 11/07/12 1525 11/07/12 2131 11/08/12 0318  TROPONINI <0.30 <0.30 <0.30    BNP (last 3 results) No results found for this basename: PROBNP,  in the last 8760 hours CBG:  Recent Labs Lab 11/07/12 1415 11/07/12 2136  GLUCAP 162* 119*    Radiological Exams on Admission: No results found.  EKG: Independently reviewed. A. Fib RVR. TWI IFL  Assessment/Plan Active Problems:   HYPOTHYROIDISM, PRIMARY   DIABETES MELLITUS, TYPE II, ON INSULIN   HYPERLIPIDEMIA-MIXED   ANEMIA, IRON DEFICIENCY   HYPERTENSION   Atrial fibrillation   CONGESTIVE HEART FAILURE, MILD   HYPOTENSION, ORTHOSTATIC   LOW BACK PAIN, CHRONIC   Chronic anticoagulation   UTI (urinary tract infection)  The patient is a very pleasant 62 year old woman who has multiple chronic medical problems for which she has regular followup and manages quite well. She came in with a story very concerning for her syncope, dizziness and weakness related to possible TIA. The story of "beauty shop" is concerning for exterior circulation stroke that manifests as severe dizziness, she was also in A. fib with RVR and had a subtherapeutic INR so she is at very high risk for embolic stroke from her A. Fib. She has a UTI which also may have led to the atrial fibrillation with RVR.   Given history and risk factors we'll check an MRI and an MRA of her head and brain to rule out stroke, TIA  Optimize her therapies for stroke prevention  which will mainly be in the form of anticoagulation since she has atrial fibrillation and embolic strokes. At  this point she may not be a good candidate for warfarin and may need to consider some of the newer anticoagulants that are more consistent and do not have as much variability in overall better outcomes for stroke prevention in atrial fibrillation. Continue her statin, blood pressure control and rate control for her A. Fib. His morning her rate is 62 which is completely normal this is on her standing dose of metoprolol at home. She has not required any IV medications for rate control.  We'll treat her urinary tract infection with IV Rocephin, can transition to oral regimen once sensitivities returned.  Home meds for all of her other chronic medical problems were resumed with the exception of her oral hypoglycemics which has been held and she has been placed on sliding scale insulin with her Lantus dose reduced by 50% during her hospitalization.    Time spent: 93  [facility] Pager [phone]  If 7PM-7AM, please contact night-coverage [email or web address] Password [facility] 11/08/2012, 6:14 AM

## 2012-11-08 NOTE — Consult Note (Signed)
[facility] [name] A. Tara Pilgrim, MD     [email or web address]          [name] is an 62 y.o. female.   ASSESSMENT/PLAN:  1. The current event seems consistent with complex partial seizures. Additionally, she has had several other events that were unexplained and where she found herself on the ground without recognizing how she got there. The events are worrisome for possible unwitnessed/complex partial seizures. The patient also seemed to have a UTI which can cause gait impairment and altered mental status but he would not explain the other spells that she has had in the last several months. The patient will therefore be started on the low-dose Keppra. An EEG will be obtained. 2. Tardive dyskinesia. The culprits could be antidepressant as the patient has never been prescribed neuroleptics for the patient. I would suggest discontinuation of the celexa and see how she does off this. 3. A-fib.    The patient is a 62 year old white female who presents with the acute onset of dizziness and unresponsiveness. She reports that she had difficulty saying what she wanted to say. It appears that she also may have become unresponsive and lost some time during the event. She is amnestic for some of the event estimated to be a couple of minutes. The patient became weak all over and almost fell to the ground but was caught by other people. I witness indicates that the patient had flexion and stiffening of the right upper extremity. The entire event lasted for several minutes. Apparently, her blood sugar was normal at a time. The patient's daughter is present during the evaluation today and reports that the patient has had several events of falling. The description is somewhat ill-defined and confusion at times. The most consistent finding however seem to me that the patient finds herself on the ground and does not recall how she got there. She remembers falling but does not have dizziness consistently  for these episodes. She seemed to have some amnesia for the event. The patient has had a sense of evaluation so far during the workup which has been mostly unrevealing. She apparently is not back to baseline. The daughter reports that she seems to be unresponsive at times and has word finding difficulties. The patient is noted to have continuous thrusting movement of the tongue. She tells me that she has been diagnosed with tardive dyskinesia by Dr. [name]. It appears however that she has not been taking any neuroleptics. She has been taking antidepressants particularly Celexa. The daughter reports that she has noted the patient having some tremors of the upper extremities sometimes. The review of systems otherwise negative other than stated in the history of present illness.  GENERAL: This is a pleasant moderately overweight lady who is in no acute distress.  HEENT: Supple. Atraumatic normocephalic. The patient has frequent dressing and movements of the tongue.  ABDOMEN: soft  EXTREMITIES: No edema   BACK: Normal.  SKIN: Normal by inspection.    MENTAL STATUS: Alert and oriented. Speech, language and cognition are generally intact. Judgment and insight normal.   CRANIAL NERVES: Pupils are equal, round and reactive to light and accommodation; extra ocular movements are full, there is no significant nystagmus; visual fields are full; upper and lower facial muscles are normal in strength and symmetric, there is no flattening of the nasolabial folds; tongue is midline; uvula is midline; shoulder elevation is normal.  MOTOR: Normal tone, bulk and strength; no pronator drift.  COORDINATION: Left finger to  nose is normal, right finger to nose is normal, No rest tremor; no intention tremor; no postural tremor; no bradykinesia.  REFLEXES: Deep tendon reflexes are symmetrical and normal. Babinski reflexes are flexor bilaterally.   SENSATION: Normal to light touch.      Likely complex partial  seizures.    EEG AND KEPRA   Past Medical History  Diagnosis Date  . Chest pain     Negative cardiac catheterization in 2002; negative stress nuclear study in 2008  . Congestive heart failure     Normal LV systolic function  . PAF (paroxysmal atrial fibrillation) 2009  . Chronic anticoagulation   . Diabetes mellitus, type 2     Insulin therapy; exacerbated by prednisone  . Syncope     Admitted 05/2009; magnetic resonance imagin/ MRA - negative; etiology thought to be orthostasis secondary to drugs and dehydration  . GERD (gastroesophageal reflux disease)   . IBS (irritable bowel syndrome)   . Hiatal hernia   . Gastroparesis     99% retention 05/2008 on GES  . Hyperlipidemia   . Hypertension   . Hypothyroid   . Chronic LBP     Surgical intervention in 1996  . Obesity   . OSA on CPAP   . Anemia     H/H of 10/30 with a normal MCV in 12/09  . Barrett's esophagus     Diagnosed 1995. Last EGD 2008.     Past Surgical History  Procedure Laterality Date  . Cardiovascular stress test  2008    Stress nuclear study  . Back surgery  1996  . Laparoscopic cholecystectomy  1990s  . Total abdominal hysterectomy  1999  . Laminectomy  1995    L4-L5  . Carpal tunnel release  1994  . Esophagogastroduodenoscopy  2008    Barrett's without dysplasia. esphagus dilated. antral erosions, h.pylori serologies negative.  . Colonoscopy  2013    Patient reports normal TCS around 10 years ago; and a repeat study in 2013 that was negative  . Givens capsule study  12/07/2011    Procedure: GIVENS CAPSULE STUDY;  Surgeon: West Bali, MD;  Location: [facility];  Service: Endoscopy;  Laterality: N/A;  . Cardiac catheterization  2002    Family History  Problem Relation Age of Onset  . Hypertension Mother   . Alzheimer's disease Mother   . Stroke Mother   . Heart attack Mother   . Heart disease Neg Hx   . Hypertension Other   . Colon cancer Neg Hx     Social History:  reports that she  has quit smoking. She has quit using smokeless tobacco. She reports that she does not drink alcohol or use illicit drugs.  Allergies:  Allergies  Allergen Reactions  . Codeine Nausea And Vomiting    HALLUCINATIONS    Medications:  Blood pressure 141/86, pulse 87, temperature 98.4 F (36.9 C), temperature source Oral, resp. rate 18, height 5\' 6"  (1.676 m), weight 101.152 kg (223 lb), SpO2 98.00%.   Results for orders placed during the hospital encounter of 11/07/12 (from the past 48 hour(s))  GLUCOSE, CAPILLARY     Status: Abnormal   Collection Time    11/07/12  2:15 PM      Result Value Range   Glucose-Capillary 162 (*) 70 - 99 mg/dL   Comment 1 Documented in Chart     Comment 2 Notify RN    CBC WITH DIFFERENTIAL     Status: Abnormal   Collection Time  11/07/12  3:25 PM      Result Value Range   WBC 11.3 (*) 4.0 - 10.5 K/uL   RBC 4.05  3.87 - 5.11 MIL/uL   Hemoglobin 11.2 (*) 12.0 - 15.0 g/dL   HCT 16.1 (*) 09.6 - 04.5 %   MCV 86.2  78.0 - 100.0 fL   MCH 27.7  26.0 - 34.0 pg   MCHC 32.1  30.0 - 36.0 g/dL   RDW 40.9  81.1 - 91.4 %   Platelets 228  150 - 400 K/uL   Neutrophils Relative 73  43 - 77 %   Neutro Abs 8.3 (*) 1.7 - 7.7 K/uL   Lymphocytes Relative 19  12 - 46 %   Lymphs Abs 2.1  0.7 - 4.0 K/uL   Monocytes Relative 8  3 - 12 %   Monocytes Absolute 0.9  0.1 - 1.0 K/uL   Eosinophils Relative 0  0 - 5 %   Eosinophils Absolute 0.0  0.0 - 0.7 K/uL   Basophils Relative 0  0 - 1 %   Basophils Absolute 0.0  0.0 - 0.1 K/uL  COMPREHENSIVE METABOLIC PANEL     Status: Abnormal   Collection Time    11/07/12  3:25 PM      Result Value Range   Sodium 139  135 - 145 mEq/L   Potassium 4.3  3.5 - 5.1 mEq/L   Chloride 106  96 - 112 mEq/L   CO2 20  19 - 32 mEq/L   Glucose, Bld 158 (*) 70 - 99 mg/dL   BUN 14  6 - 23 mg/dL   Creatinine, Ser 7.82  0.50 - 1.10 mg/dL   Calcium 8.9  8.4 - 95.6 mg/dL   Total Protein 6.7  6.0 - 8.3 g/dL   Albumin 3.7  3.5 - 5.2 g/dL   AST 13   0 - 37 U/L   ALT 12  0 - 35 U/L   Alkaline Phosphatase 60  39 - 117 U/L   Total Bilirubin 0.4  0.3 - 1.2 mg/dL   GFR calc non Af Amer >90  >90 mL/min   GFR calc Af Amer >90  >90 mL/min   Comment:            The eGFR has been calculated     using the CKD EPI equation.     This calculation has not been     validated in all clinical     situations.     eGFR's persistently     <90 mL/min signify     possible Chronic Kidney Disease.  SAMPLE TO BLOOD BANK     Status: None   Collection Time    11/07/12  3:25 PM      Result Value Range   Sample Expiration 11/10/2012    APTT     Status: None   Collection Time    11/07/12  3:25 PM      Result Value Range   aPTT 28  24 - 37 seconds  PROTIME-INR     Status: Abnormal   Collection Time    11/07/12  3:25 PM      Result Value Range   Prothrombin Time 19.8 (*) 11.6 - 15.2 seconds   INR 1.75 (*) 0.00 - 1.49  TROPONIN I     Status: None   Collection Time    11/07/12  3:25 PM      Result Value Range   Troponin I <0.30  <0.30 ng/mL  Comment:            Due to the release kinetics of cTnI,     a negative result within the first hours     of the onset of symptoms does not rule out     myocardial infarction with certainty.     If myocardial infarction is still suspected,     repeat the test at appropriate intervals.  URINALYSIS, ROUTINE W REFLEX MICROSCOPIC     Status: Abnormal   Collection Time    11/07/12  4:00 PM      Result Value Range   Color, Urine YELLOW  YELLOW   APPearance CLOUDY (*) CLEAR   Specific Gravity, Urine >1.030 (*) 1.005 - 1.030   pH 5.5  5.0 - 8.0   Glucose, UA NEGATIVE  NEGATIVE mg/dL   Hgb urine dipstick NEGATIVE  NEGATIVE   Bilirubin Urine NEGATIVE  NEGATIVE   Ketones, ur NEGATIVE  NEGATIVE mg/dL   Protein, ur TRACE (*) NEGATIVE mg/dL   Urobilinogen, UA 0.2  0.0 - 1.0 mg/dL   Nitrite NEGATIVE  NEGATIVE   Leukocytes, UA SMALL (*) NEGATIVE  URINE MICROSCOPIC-ADD ON     Status: Abnormal   Collection Time     11/07/12  4:00 PM      Result Value Range   Squamous Epithelial / LPF RARE  RARE   WBC, UA 21-50  <3 WBC/hpf   Bacteria, UA MANY (*) RARE  TSH     Status: None   Collection Time    11/07/12  9:31 PM      Result Value Range   TSH 1.773  0.350 - 4.500 uIU/mL  HEMOGLOBIN A1C     Status: Abnormal   Collection Time    11/07/12  9:31 PM      Result Value Range   Hemoglobin A1C 6.7 (*) <5.7 %   Comment: (NOTE)                                                                               According to the ADA Clinical Practice Recommendations for 2011, when     HbA1c is used as a screening test:      >=6.5%   Diagnostic of Diabetes Mellitus               (if abnormal result is confirmed)     5.7-6.4%   Increased risk of developing Diabetes Mellitus     References:Diagnosis and Classification of Diabetes Mellitus,Diabetes     Care,2011,34(Suppl 1):S62-S69 and Standards of Medical Care in             Diabetes - 2011,Diabetes Care,2011,34 (Suppl 1):S11-S61.   Mean Plasma Glucose 146 (*) <117 mg/dL  TROPONIN I     Status: None   Collection Time    11/07/12  9:31 PM      Result Value Range   Troponin I <0.30  <0.30 ng/mL   Comment:            Due to the release kinetics of cTnI,     a negative result within the first hours     of the onset of symptoms does not rule out  myocardial infarction with certainty.     If myocardial infarction is still suspected,     repeat the test at appropriate intervals.  GLUCOSE, CAPILLARY     Status: Abnormal   Collection Time    11/07/12  9:36 PM      Result Value Range   Glucose-Capillary 119 (*) 70 - 99 mg/dL  TROPONIN I     Status: None   Collection Time    11/08/12  3:18 AM      Result Value Range   Troponin I <0.30  <0.30 ng/mL   Comment:            Due to the release kinetics of cTnI,     a negative result within the first hours     of the onset of symptoms does not rule out     myocardial infarction with certainty.     If myocardial  infarction is still suspected,     repeat the test at appropriate intervals.  BASIC METABOLIC PANEL     Status: Abnormal   Collection Time    11/08/12  3:19 AM      Result Value Range   Sodium 138  135 - 145 mEq/L   Potassium 4.0  3.5 - 5.1 mEq/L   Chloride 107  96 - 112 mEq/L   CO2 22  19 - 32 mEq/L   Glucose, Bld 187 (*) 70 - 99 mg/dL   BUN 13  6 - 23 mg/dL   Creatinine, Ser 1.61  0.50 - 1.10 mg/dL   Calcium 8.8  8.4 - 09.6 mg/dL   GFR calc non Af Amer >90  >90 mL/min   GFR calc Af Amer >90  >90 mL/min   Comment:            The eGFR has been calculated     using the CKD EPI equation.     This calculation has not been     validated in all clinical     situations.     eGFR's persistently     <90 mL/min signify     possible Chronic Kidney Disease.  CBC     Status: Abnormal   Collection Time    11/08/12  3:19 AM      Result Value Range   WBC 8.5  4.0 - 10.5 K/uL   RBC 3.74 (*) 3.87 - 5.11 MIL/uL   Hemoglobin 10.3 (*) 12.0 - 15.0 g/dL   HCT 04.5 (*) 40.9 - 81.1 %   MCV 86.1  78.0 - 100.0 fL   MCH 27.5  26.0 - 34.0 pg   MCHC 32.0  30.0 - 36.0 g/dL   RDW 91.4 (*) 78.2 - 95.6 %   Platelets 194  150 - 400 K/uL  PROTIME-INR     Status: Abnormal   Collection Time    11/08/12  3:19 AM      Result Value Range   Prothrombin Time 19.4 (*) 11.6 - 15.2 seconds   INR 1.70 (*) 0.00 - 1.49  GLUCOSE, CAPILLARY     Status: Abnormal   Collection Time    11/08/12  7:45 AM      Result Value Range   Glucose-Capillary 173 (*) 70 - 99 mg/dL   Comment 1 Notify RN    TROPONIN I     Status: None   Collection Time    11/08/12  9:36 AM      Result Value Range   Troponin I <0.30  <0.30 ng/mL  Comment:            Due to the release kinetics of cTnI,     a negative result within the first hours     of the onset of symptoms does not rule out     myocardial infarction with certainty.     If myocardial infarction is still suspected,     repeat the test at appropriate intervals.  GLUCOSE,  CAPILLARY     Status: Abnormal   Collection Time    11/08/12 11:02 AM      Result Value Range   Glucose-Capillary 226 (*) 70 - 99 mg/dL  GLUCOSE, CAPILLARY     Status: Abnormal   Collection Time    11/08/12 12:25 PM      Result Value Range   Glucose-Capillary 202 (*) 70 - 99 mg/dL   Comment 1 Notify RN    GLUCOSE, CAPILLARY     Status: Abnormal   Collection Time    11/08/12  5:04 PM      Result Value Range   Glucose-Capillary 151 (*) 70 - 99 mg/dL   Comment 1 Documented in Chart     Comment 2 Notify RN      Mr Shirlee Latch Wo Contrast  11/08/2012  *RADIOLOGY REPORT*  Clinical Data: Dizziness with atrial fibrillation and gait difficulty.  MRA HEAD WITHOUT CONTRAST  Technique: Angiographic images of the Circle of Willis were obtained using MRA technique without intravenous contrast.  Comparison: MRI brain performed concurrently.  Findings: There is mild nonstenotic irregularity of the supraclinoid internal carotid artery on the right.  There is a focal 75 percent stenosis of the left internal carotid artery supraclinoid segment.  Both ICA termini are unremarkable.  Basilar artery widely patent with left vertebral slightly larger.  No distal vertebral stenosis.   The proximal anterior, middle, and posterior cerebral arteries appear widely patent. There is moderate irregularity of the distal MCA and PCA branches consistent with intracranial atherosclerotic change.  Both distal PCAs are poorly visualized.  There is no cerebellar branch occlusion.  No intracranial aneurysm is seen.  IMPRESSION: No evidence for proximal vertebral or basilar stenosis.  Moderate irregularity distal MCA and PCA branches consistent with intracranial atherosclerotic change.  Potentially flow reducing stenosis supraclinoid left internal carotid artery, estimated to be 75%.   Original Report Authenticated By: Davonna Belling, M.D.    Mr Brain Wo Contrast  11/08/2012  *RADIOLOGY REPORT*  Clinical Data: Near syncope.  Gait  disturbance.  MRI HEAD WITHOUT CONTRAST  Technique:  Multiplanar, multiecho pulse sequences of the brain and surrounding structures were obtained according to standard protocol without intravenous contrast.  Comparison: CT head 10/04/2012.  Also MR head 05/30/2009.  Findings:  There is no evidence for acute infarction, intracranial hemorrhage, mass lesion, hydrocephalus, or extra-axial fluid. Mild atrophy is present.  There is mild chronic microvascular ischemic change in the periventricular and subcortical white matter.  Normal pituitary and cerebellar tonsils.  There are no foci of chronic hemorrhage.  Calvarium and skull base appear intact.  Flow voids are maintained in the internal carotid arteries and basilar artery. There is no acute sinus or mastoid disease. Chronic right sphenoid sinus disease remains, similar to priors.    Upper cervical region unremarkable.  Compared with MR from 2010, there is mild progression of small vessel disease.  IMPRESSION: Mild atrophy and chronic microvascular ischemic change.  No acute stroke is evident.   Original Report Authenticated By: Davonna Belling, M.D.    US  Carotid Duplex Bilateral  11/08/2012  *RADIOLOGY REPORT*  Clinical Data: Stroke symptoms, hypertension  BILATERAL CAROTID DUPLEX ULTRASOUND  Technique: Wallace Cullens scale imaging, color Doppler and duplex ultrasound was performed of bilateral carotid and vertebral arteries in the neck.  Comparison:  None.  Criteria:  Quantification of carotid stenosis is based on velocity parameters that correlate the residual internal carotid diameter with NASCET-based stenosis levels, using the diameter of the distal internal carotid lumen as the denominator for stenosis measurement.  The following velocity measurements were obtained:                   PEAK SYSTOLIC/END DIASTOLIC RIGHT ICA:                        53/13cm/sec CCA:                        62/14cm/sec SYSTOLIC ICA/CCA RATIO:     0.84 DIASTOLIC ICA/CCA RATIO:    0.90 ECA:                         66cm/sec  LEFT ICA:                        46/16cm/sec CCA:                        60/12cm/sec SYSTOLIC ICA/CCA RATIO:     0.77 DIASTOLIC ICA/CCA RATIO:    1.38 ECA:                        55cm/sec  Findings:  RIGHT CAROTID ARTERY: Minor atherosclerotic changes.  No hemodynamically significant right ICA stenosis, velocity elevation, or turbulent flow.  RIGHT VERTEBRAL ARTERY:  Antegrade  LEFT CAROTID ARTERY: Minor atherosclerotic changes.  No hemodynamically significant left ICA stenosis, velocity elevation, or turbulent flow.  LEFT VERTEBRAL ARTERY:  Antegrade  IMPRESSION: Mild bilateral carotid atherosclerosis but no hemodynamically significant ICA stenosis by ultrasound.  Degree of narrowing less than 50% bilaterally.   Original Report Authenticated By: Judie Petit. Miles Costain, M.D.         Melat Wrisley A. [name], M.D.  Diplomate, Biomedical engineer of Psychiatry and Neurology ( Neurology). 11/08/2012, 6:32 PM

## 2012-11-08 NOTE — Progress Notes (Signed)
ANTICOAGULATION CONSULT NOTE - Follow Up Consult  Pharmacy Consult for Coumadin and Lovenox Indication: atrial fibrillation  Allergies  Allergen Reactions  . Codeine Nausea And Vomiting    HALLUCINATIONS   Patient Measurements: Height: 5\' 6"  (167.6 cm) Weight: 223 lb (101.152 kg) IBW/kg (Calculated) : 59.3  Vital Signs: Temp: 98.4 F (36.9 C) (05/08 0427) Temp src: Oral (05/08 0427) BP: 140/81 mmHg (05/08 0427) Pulse Rate: 75 (05/08 0427)  Labs:  Recent Labs  11/07/12 1525 11/07/12 2131 11/08/12 0318 11/08/12 0319 11/08/12 0936  HGB 11.2*  --   --  10.3*  --   HCT 34.9*  --   --  32.2*  --   PLT 228  --   --  194  --   APTT 28  --   --   --   --   LABPROT 19.8*  --   --  19.4*  --   INR 1.75*  --   --  1.70*  --   CREATININE 0.62  --   --  0.61  --   TROPONINI <0.30 <0.30 <0.30  --  <0.30    Estimated Creatinine Clearance: 86.5 ml/min (by C-G formula based on Cr of 0.61).  Medications:  Prescriptions prior to admission  Medication Sig Dispense Refill  . acetaminophen (TYLENOL) 500 MG tablet Take 1,000 mg by mouth daily as needed. For pain      . citalopram (CELEXA) 20 MG tablet Take 20 mg by mouth every morning.       . furosemide (LASIX) 40 MG tablet Take 40 mg by mouth daily as needed. Swelling      . glipiZIDE (GLUCOTROL) 10 MG tablet Take 10 mg by mouth 2 (two) times daily.       . insulin glargine (LANTUS) 100 UNIT/ML injection Inject 50 Units into the skin at bedtime. *ONLY takes when blood sugar levels exceed 200*      . levothyroxine (SYNTHROID, LEVOTHROID) 175 MCG tablet Take 175 mcg by mouth every morning.      . loperamide (IMODIUM) 2 MG capsule Take 2 mg by mouth 4 (four) times daily as needed. Diarrhea      . LORazepam (ATIVAN) 1 MG tablet Take 1 mg by mouth 3 (three) times daily as needed. For anxiety      . metaxalone (SKELAXIN) 800 MG tablet Take 800 mg by mouth daily.       . metFORMIN (GLUCOPHAGE) 500 MG tablet Take 1,000 mg by mouth 2 (two)  times daily with a meal.      . metoCLOPramide (REGLAN) 10 MG tablet Take 10 mg by mouth 4 (four) times daily.       . metoprolol tartrate (LOPRESSOR) 100 MG tablet Take 1 tablet (100 mg total) by mouth 2 (two) times daily.  60 tablet  6  . nitroGLYCERIN (NITROSTAT) 0.4 MG SL tablet Place 0.4 mg under the tongue as needed. For chest pain       . omeprazole (PRILOSEC) 20 MG capsule Take 20 mg by mouth 2 (two) times daily.        . ondansetron (ZOFRAN) 8 MG tablet Take 8 mg by mouth daily as needed. nausea      . oxyCODONE-acetaminophen (PERCOCET/ROXICET) 5-325 MG per tablet Take 1 tablet by mouth every 6 (six) hours as needed for pain.       . potassium chloride SA (K-DUR,KLOR-CON) 20 MEQ tablet Take 20 mEq by mouth 3 (three) times daily.       Marland Kitchen  simvastatin (ZOCOR) 40 MG tablet Take 40 mg by mouth at bedtime.       Marland Kitchen warfarin (COUMADIN) 2.5 MG tablet Take 1.25-2.5 mg by mouth daily. 2.5 mg on Tues, Thurs, and Sat. 1.25 on Mon, Wed, Fri and Sun.        Assessment: Pt on chronic coumadin for afib.  Home dose listed above.  INR subtherapeutic on admission.  Cover with Lovenox until INR 2-3.  Good renal fxn.  Estimated Creatinine Clearance: 86.5 ml/min (by C-G formula based on Cr of 0.61).  Goal of Therapy:  INR 2-3 Monitor platelets by anticoagulation protocol: Yes   Plan:  Coumadin 5mg  PO today x 1 Lovenox 1mg /Kg SQ q12hrs until INR therapeutic Monitor CBC INR daily  Valrie Hart A 11/08/2012,11:16 AM

## 2012-11-08 NOTE — Progress Notes (Signed)
Utilization Review Complete  

## 2012-11-09 ENCOUNTER — Inpatient Hospital Stay (HOSPITAL_COMMUNITY): Payer: Medicare Other | Attending: Neurology

## 2012-11-09 ENCOUNTER — Ambulatory Visit: Payer: Self-pay | Admitting: Diagnostic Neuroimaging

## 2012-11-09 ENCOUNTER — Encounter: Payer: Self-pay | Admitting: Cardiology

## 2012-11-09 LAB — CBC
HCT: 32 % — ABNORMAL LOW (ref 36.0–46.0)
Hemoglobin: 10.3 g/dL — ABNORMAL LOW (ref 12.0–15.0)
MCHC: 32.2 g/dL (ref 30.0–36.0)
RBC: 3.72 MIL/uL — ABNORMAL LOW (ref 3.87–5.11)

## 2012-11-09 LAB — URINE CULTURE: Colony Count: >=100000

## 2012-11-09 LAB — GLUCOSE, CAPILLARY
Glucose-Capillary: 190 mg/dL — ABNORMAL HIGH (ref 70–99)
Glucose-Capillary: 235 mg/dL — ABNORMAL HIGH (ref 70–99)

## 2012-11-09 LAB — PROTIME-INR
INR: 2.15 — ABNORMAL HIGH (ref 0.00–1.49)
Prothrombin Time: 23.1 seconds — ABNORMAL HIGH (ref 11.6–15.2)

## 2012-11-09 LAB — HOMOCYSTEINE: Homocysteine: 6.1 umol/L (ref 4.0–15.4)

## 2012-11-09 LAB — TSH: TSH: 1.94 u[IU]/mL (ref 0.350–4.500)

## 2012-11-09 LAB — VITAMIN B12: Vitamin B-12: 257 pg/mL (ref 211–911)

## 2012-11-09 MED ORDER — WARFARIN SODIUM 2.5 MG PO TABS
2.5000 mg | ORAL_TABLET | Freq: Once | ORAL | Status: AC
  Administered 2012-11-09: 2.5 mg via ORAL
  Filled 2012-11-09: qty 1

## 2012-11-09 NOTE — Care Management Note (Unsigned)
    Page 1 of 1   11/09/2012     2:55:41 PM   CARE MANAGEMENT NOTE 11/09/2012  Patient:  [name], [name]   Account Number:  [id number]  Date Initiated:  11/09/2012  Documentation initiated by:  Rosemary Holms  Subjective/Objective Assessment:   Pt admitted form home where she lives with spouse daughter and stepson. Pt may benefit from outpt therapies. No other needs identified.     Action/Plan:   Anticipated DC Date:  11/11/2012   Anticipated DC Plan:  HOME/SELF CARE      DC Planning Services  CM consult      Choice offered to / List presented to:             Status of service:  In process, will continue to follow Medicare Important Message given?  YES (If response is "NO", the following Medicare IM given date fields will be blank) Date Medicare IM given:  11/09/2012 Date Additional Medicare IM given:    Discharge Disposition:    Per UR Regulation:    If discussed at Long Length of Stay Meetings, dates discussed:    Comments:  11/09/12 Rosemary Holms RN BSN CM Note left for MD on chart for outpt therapy consideration.

## 2012-11-09 NOTE — Procedures (Signed)
[name], [name]                 ACCOUNT NO.:  [id number]  MEDICAL RECORD NO.:  [id number]  LOCATION:  A323                          FACILITY:  [facility]  PHYSICIAN:  Theodora Lalanne A. Gerilyn Pilgrim, M.D. DATE OF BIRTH:  April 30, 1951  DATE OF PROCEDURE:  11/09/2012 DATE OF DISCHARGE:                             EEG INTERPRETATION   INDICATIONS:  A 62 year old who presents with spells of altered mental status, confusion and unresponsiveness.  The study is being done to evaluate for nonconvulsive seizures.  MEDICATIONS:  Aspirin, Celexa, insulin, Keppra, Synthroid, Ativan, Reglan, Lopressor, Zofran, Zocor.  ANALYSIS:  A 16-channel recording using standard 10/20 measurement is conducted for 24 minutes.  There is a well-formed posterior dominant rhythm of 8 hertz which attenuates with eye opening.  There is beta activity observed in the frontal areas.  Awake and drowsy activities are recorded.  Photic stimulation is carried out without abnormal changes in the background activity.  There is no focal or lateralized slowing. There is no epileptiform activity observed.  IMPRESSION:  Normal recording of awake and drowsy states.     [name] A. Gerilyn Pilgrim, M.D.     KAD/MEDQ  D:  11/09/2012  T:  11/09/2012  Job:  [id number]

## 2012-11-09 NOTE — Progress Notes (Signed)
Patient ID: AHMYA BERNICK, female   DOB: [birth year], 62 y.o.   MRN: [id number]  [facility] Farris Geiman A. Gerilyn Pilgrim, MD     [email or web address]          PHIL CORTI is an 62 y.o. female.   Assessment/Plan: 1. Unexplained spells of weakness, dizziness, loss of consciousness, and tonic-clonic movements of the right and altered mental status. The patient's EEG has been read and is normal. I think that we should still treat the patient empirically with low dosage of medication. It is possible that some of her current symptoms could be due to acute urinary tract infection. Physical therapy is recommended. 2.  Mild Headaches. Treat symptomatically.  The patient reports that she is feeling a lot better. The family also agrees that she is a lot better although she still reports having some global weakness. She also complains of frontal headache. She reports being hot although she does not feel warm to the touch.  GENERAL: She is in some discomfort but no acute distress.  HEENT: Neck is supple.  EXTREMITIES: No edema   BACK: Unremarkable.  SKIN: Normal by inspection.    MENTAL STATUS: Alert and oriented. Speech, language and cognition are generally intact. Judgment and insight normal.   MOTOR: Normal tone, bulk and strength; no pronator drift.  COORDINATION: Left finger to nose is normal, right finger to nose is normal, No rest tremor; no intention tremor; no postural tremor; no bradykinesia.     Objective: Vital signs in last 24 hours: Temp:  [97.8 F (36.6 C)-98.5 F (36.9 C)] 98.5 F (36.9 C) (05/09 1349) Pulse Rate:  [74-93] 77 (05/09 1349) Resp:  [20] 20 (05/09 1349) BP: (95-140)/(60-85) 123/77 mmHg (05/09 1349) SpO2:  [6 %-97 %] 97 % (05/09 1349)  Intake/Output from previous day:   Intake/Output this shift: Total I/O In: 240 [P.O.:240] Out: -  Nutritional status: Carb Control   Lab Results: Results for orders placed during the hospital encounter of 11/07/12  (from the past 48 hour(s))  TSH     Status: None   Collection Time    11/07/12  9:31 PM      Result Value Range   TSH 1.773  0.350 - 4.500 uIU/mL  HEMOGLOBIN A1C     Status: Abnormal   Collection Time    11/07/12  9:31 PM      Result Value Range   Hemoglobin A1C 6.7 (*) <5.7 %   Comment: (NOTE)                                                                               According to the ADA Clinical Practice Recommendations for 2011, when     HbA1c is used as a screening test:      >=6.5%   Diagnostic of Diabetes Mellitus               (if abnormal result is confirmed)     5.7-6.4%   Increased risk of developing Diabetes Mellitus     References:Diagnosis and Classification of Diabetes Mellitus,Diabetes     Care,2011,34(Suppl 1):S62-S69 and Standards of Medical Care in             Diabetes -  2011,Diabetes Care,2011,34 (Suppl 1):S11-S61.   Mean Plasma Glucose 146 (*) <117 mg/dL  TROPONIN I     Status: None   Collection Time    11/07/12  9:31 PM      Result Value Range   Troponin I <0.30  <0.30 ng/mL   Comment:            Due to the release kinetics of cTnI,     a negative result within the first hours     of the onset of symptoms does not rule out     myocardial infarction with certainty.     If myocardial infarction is still suspected,     repeat the test at appropriate intervals.  GLUCOSE, CAPILLARY     Status: Abnormal   Collection Time    11/07/12  9:36 PM      Result Value Range   Glucose-Capillary 119 (*) 70 - 99 mg/dL  TROPONIN I     Status: None   Collection Time    11/08/12  3:18 AM      Result Value Range   Troponin I <0.30  <0.30 ng/mL   Comment:            Due to the release kinetics of cTnI,     a negative result within the first hours     of the onset of symptoms does not rule out     myocardial infarction with certainty.     If myocardial infarction is still suspected,     repeat the test at appropriate intervals.  BASIC METABOLIC PANEL     Status:  Abnormal   Collection Time    11/08/12  3:19 AM      Result Value Range   Sodium 138  135 - 145 mEq/L   Potassium 4.0  3.5 - 5.1 mEq/L   Chloride 107  96 - 112 mEq/L   CO2 22  19 - 32 mEq/L   Glucose, Bld 187 (*) 70 - 99 mg/dL   BUN 13  6 - 23 mg/dL   Creatinine, Ser 1.91  0.50 - 1.10 mg/dL   Calcium 8.8  8.4 - 47.8 mg/dL   GFR calc non Af Amer >90  >90 mL/min   GFR calc Af Amer >90  >90 mL/min   Comment:            The eGFR has been calculated     using the CKD EPI equation.     This calculation has not been     validated in all clinical     situations.     eGFR's persistently     <90 mL/min signify     possible Chronic Kidney Disease.  CBC     Status: Abnormal   Collection Time    11/08/12  3:19 AM      Result Value Range   WBC 8.5  4.0 - 10.5 K/uL   RBC 3.74 (*) 3.87 - 5.11 MIL/uL   Hemoglobin 10.3 (*) 12.0 - 15.0 g/dL   HCT 29.5 (*) 62.1 - 30.8 %   MCV 86.1  78.0 - 100.0 fL   MCH 27.5  26.0 - 34.0 pg   MCHC 32.0  30.0 - 36.0 g/dL   RDW 65.7 (*) 84.6 - 96.2 %   Platelets 194  150 - 400 K/uL  PROTIME-INR     Status: Abnormal   Collection Time    11/08/12  3:19 AM      Result Value Range  Prothrombin Time 19.4 (*) 11.6 - 15.2 seconds   INR 1.70 (*) 0.00 - 1.49  GLUCOSE, CAPILLARY     Status: Abnormal   Collection Time    11/08/12  7:45 AM      Result Value Range   Glucose-Capillary 173 (*) 70 - 99 mg/dL   Comment 1 Notify RN    TROPONIN I     Status: None   Collection Time    11/08/12  9:36 AM      Result Value Range   Troponin I <0.30  <0.30 ng/mL   Comment:            Due to the release kinetics of cTnI,     a negative result within the first hours     of the onset of symptoms does not rule out     myocardial infarction with certainty.     If myocardial infarction is still suspected,     repeat the test at appropriate intervals.  GLUCOSE, CAPILLARY     Status: Abnormal   Collection Time    11/08/12 11:02 AM      Result Value Range    Glucose-Capillary 226 (*) 70 - 99 mg/dL  GLUCOSE, CAPILLARY     Status: Abnormal   Collection Time    11/08/12 12:25 PM      Result Value Range   Glucose-Capillary 202 (*) 70 - 99 mg/dL   Comment 1 Notify RN    GLUCOSE, CAPILLARY     Status: Abnormal   Collection Time    11/08/12  5:04 PM      Result Value Range   Glucose-Capillary 151 (*) 70 - 99 mg/dL   Comment 1 Documented in Chart     Comment 2 Notify RN    GLUCOSE, CAPILLARY     Status: Abnormal   Collection Time    11/08/12  8:42 PM      Result Value Range   Glucose-Capillary 184 (*) 70 - 99 mg/dL   Comment 1 Documented in Chart     Comment 2 Notify RN    PROTIME-INR     Status: Abnormal   Collection Time    11/09/12  5:31 AM      Result Value Range   Prothrombin Time 23.1 (*) 11.6 - 15.2 seconds   INR 2.15 (*) 0.00 - 1.49  CBC     Status: Abnormal   Collection Time    11/09/12  5:31 AM      Result Value Range   WBC 7.8  4.0 - 10.5 K/uL   RBC 3.72 (*) 3.87 - 5.11 MIL/uL   Hemoglobin 10.3 (*) 12.0 - 15.0 g/dL   HCT 82.9 (*) 56.2 - 13.0 %   MCV 86.0  78.0 - 100.0 fL   MCH 27.7  26.0 - 34.0 pg   MCHC 32.2  30.0 - 36.0 g/dL   RDW 86.5 (*) 78.4 - 69.6 %   Platelets 183  150 - 400 K/uL  VITAMIN B12     Status: None   Collection Time    11/09/12  5:31 AM      Result Value Range   Vitamin B-12 257  211 - 911 pg/mL  HOMOCYSTEINE     Status: None   Collection Time    11/09/12  5:31 AM      Result Value Range   Homocysteine 6.1  4.0 - 15.4 umol/L  TSH     Status: None   Collection Time  11/09/12  5:31 AM      Result Value Range   TSH 1.940  0.350 - 4.500 uIU/mL  GLUCOSE, CAPILLARY     Status: Abnormal   Collection Time    11/09/12  7:11 AM      Result Value Range   Glucose-Capillary 173 (*) 70 - 99 mg/dL   Comment 1 Documented in Chart     Comment 2 Notify RN    GLUCOSE, CAPILLARY     Status: Abnormal   Collection Time    11/09/12 11:35 AM      Result Value Range   Glucose-Capillary 190 (*) 70 - 99 mg/dL    Comment 1 Documented in Chart     Comment 2 Notify RN    GLUCOSE, CAPILLARY     Status: Abnormal   Collection Time    11/09/12  4:17 PM      Result Value Range   Glucose-Capillary 235 (*) 70 - 99 mg/dL   Comment 1 Documented in Chart     Comment 2 Notify RN      Lipid Panel No results found for this basename: CHOL, TRIG, HDL, CHOLHDL, VLDL, LDLCALC,  in the last 72 hours  Studies/Results: Mr Shirlee Latch Wo Contrast  11/08/2012  *RADIOLOGY REPORT*  Clinical Data: Dizziness with atrial fibrillation and gait difficulty.  MRA HEAD WITHOUT CONTRAST  Technique: Angiographic images of the Circle of Willis were obtained using MRA technique without intravenous contrast.  Comparison: MRI brain performed concurrently.  Findings: There is mild nonstenotic irregularity of the supraclinoid internal carotid artery on the right.  There is a focal 75 percent stenosis of the left internal carotid artery supraclinoid segment.  Both ICA termini are unremarkable.  Basilar artery widely patent with left vertebral slightly larger.  No distal vertebral stenosis.   The proximal anterior, middle, and posterior cerebral arteries appear widely patent. There is moderate irregularity of the distal MCA and PCA branches consistent with intracranial atherosclerotic change.  Both distal PCAs are poorly visualized.  There is no cerebellar branch occlusion.  No intracranial aneurysm is seen.  IMPRESSION: No evidence for proximal vertebral or basilar stenosis.  Moderate irregularity distal MCA and PCA branches consistent with intracranial atherosclerotic change.  Potentially flow reducing stenosis supraclinoid left internal carotid artery, estimated to be 75%.   Original Report Authenticated By: Davonna Belling, M.D.    Mr Brain Wo Contrast  11/08/2012  *RADIOLOGY REPORT*  Clinical Data: Near syncope.  Gait disturbance.  MRI HEAD WITHOUT CONTRAST  Technique:  Multiplanar, multiecho pulse sequences of the brain and surrounding structures  were obtained according to standard protocol without intravenous contrast.  Comparison: CT head 10/04/2012.  Also MR head 05/30/2009.  Findings:  There is no evidence for acute infarction, intracranial hemorrhage, mass lesion, hydrocephalus, or extra-axial fluid. Mild atrophy is present.  There is mild chronic microvascular ischemic change in the periventricular and subcortical white matter.  Normal pituitary and cerebellar tonsils.  There are no foci of chronic hemorrhage.  Calvarium and skull base appear intact.  Flow voids are maintained in the internal carotid arteries and basilar artery. There is no acute sinus or mastoid disease. Chronic right sphenoid sinus disease remains, similar to priors.    Upper cervical region unremarkable.  Compared with MR from 2010, there is mild progression of small vessel disease.  IMPRESSION: Mild atrophy and chronic microvascular ischemic change.  No acute stroke is evident.   Original Report Authenticated By: Davonna Belling, M.D.    US Carotid  Duplex Bilateral  11/08/2012  *RADIOLOGY REPORT*  Clinical Data: Stroke symptoms, hypertension  BILATERAL CAROTID DUPLEX ULTRASOUND  Technique: Wallace Cullens scale imaging, color Doppler and duplex ultrasound was performed of bilateral carotid and vertebral arteries in the neck.  Comparison:  None.  Criteria:  Quantification of carotid stenosis is based on velocity parameters that correlate the residual internal carotid diameter with NASCET-based stenosis levels, using the diameter of the distal internal carotid lumen as the denominator for stenosis measurement.  The following velocity measurements were obtained:                   PEAK SYSTOLIC/END DIASTOLIC RIGHT ICA:                        53/13cm/sec CCA:                        62/14cm/sec SYSTOLIC ICA/CCA RATIO:     0.84 DIASTOLIC ICA/CCA RATIO:    0.90 ECA:                        66cm/sec  LEFT ICA:                        46/16cm/sec CCA:                        60/12cm/sec SYSTOLIC ICA/CCA  RATIO:     0.77 DIASTOLIC ICA/CCA RATIO:    1.38 ECA:                        55cm/sec  Findings:  RIGHT CAROTID ARTERY: Minor atherosclerotic changes.  No hemodynamically significant right ICA stenosis, velocity elevation, or turbulent flow.  RIGHT VERTEBRAL ARTERY:  Antegrade  LEFT CAROTID ARTERY: Minor atherosclerotic changes.  No hemodynamically significant left ICA stenosis, velocity elevation, or turbulent flow.  LEFT VERTEBRAL ARTERY:  Antegrade  IMPRESSION: Mild bilateral carotid atherosclerosis but no hemodynamically significant ICA stenosis by ultrasound.  Degree of narrowing less than 50% bilaterally.   Original Report Authenticated By: Judie Petit. Miles Costain, M.D.     Medications:  Scheduled Meds: . aspirin EC  81 mg Oral Daily  . citalopram  20 mg Oral q morning - 10a  . insulin aspart  0-15 Units Subcutaneous TID WC  . insulin glargine  25 Units Subcutaneous QHS  . levETIRAcetam  250 mg Oral BID  . levothyroxine  175 mcg Oral QAC breakfast  . metoCLOPramide  10 mg Oral QID  . metoprolol tartrate  100 mg Oral BID  . pantoprazole  40 mg Oral Daily  . senna  1 tablet Oral BID  . simvastatin  40 mg Oral QHS  . sodium chloride  3 mL Intravenous Q12H  . Warfarin - Pharmacist Dosing Inpatient   Does not apply Q24H   Continuous Infusions:  PRN Meds:.alum & mag hydroxide-simeth, loperamide, LORazepam, ondansetron (ZOFRAN) IV, ondansetron, oxyCODONE-acetaminophen    LOS: 2 days   Willean Schurman A. Gerilyn Pilgrim, M.D.  Diplomate, Biomedical engineer of Psychiatry and Neurology ( Neurology).

## 2012-11-09 NOTE — Progress Notes (Signed)
[name], [name]                 ACCOUNT NO.:  [id number]  MEDICAL RECORD NO.:  [id number]  LOCATION:  A323                          FACILITY:  [facility]  PHYSICIAN:  [name], M.D.DATE OF BIRTH:  09/21/1950  DATE OF PROCEDURE: DATE OF DISCHARGE:                                PROGRESS NOTE   This 62 year old was admitted to the hospital yesterday after development of severe generalized weakness.  She felt her heart was racing and she was short of breath.  She had a similar episode about 6 weeks ago, and was seen by Dr. Dietrich Pates, her cardiologist.  She was put on metoprolol at that time for rate control.  In addition, she has had mouth movements for at least 2 or 3 years noted by her family including her daughter, who is in the room today, and myself in the office some years ago.  This seems to be stable.  For the last 3 months she has had changes in her speech and language. She has had trouble finding words at times.  She has responded slightly slower than she would have.  She does not initiate speech.  All of this I have heard about the first time in the room today from her daughter.  Other medical problems have included diabetes, morbid obesity, chronic low back pain, congestive heart failure, hypertension, atrial fibrillation, reflux esophagitis with Barrett's esophagus, gastroparesis, IBS, sleep apnea and other issues.  Physically she has done much better recently in that she has lost significant weight.  Today, she is sitting in the room, daughter is with her.  Temperature 98.4, pulse 75, respiratory rate 18, blood pressure 140/81.  Her speech is slowed.  Her tongue is moving around inside her mouth and pushing on the buccal mucosa.  She has a vacant stare.  She seems to have proptosis as well.  She has a fairly regular rhythm today and is in what appears to be quadrigeminy.  There are no murmurs heard.  Her lungs are clear throughout.  There is no edema of the  ankles.  ASSESSMENT: 1. Near syncopal episode. 2. She has symptoms suggestive of tardive dyskinesia. 3. Type 2 diabetes mellitus. 4. Obesity. 5. Congestive heart failure. 6. Chronic atrial fibrillation.  PLAN:  I am asking for consults from both Neurology and Cardiology.  I am going to check a TSH.  Carotid Doppler is also pending.     [name], M.D.     SDK/MEDQ  D:  11/08/2012  T:  11/09/2012  Job:  [id number]

## 2012-11-09 NOTE — Progress Notes (Signed)
ANTICOAGULATION CONSULT NOTE   Pharmacy Consult for Coumadin and Lovenox Indication: atrial fibrillation  Allergies  Allergen Reactions  . Codeine Nausea And Vomiting    HALLUCINATIONS   Patient Measurements: Height: 5\' 6"  (167.6 cm) Weight: 223 lb (101.152 kg) IBW/kg (Calculated) : 59.3  Vital Signs: Temp: 97.8 F (36.6 C) (05/09 0505) Temp src: Oral (05/09 0505) BP: 140/84 mmHg (05/09 1033) Pulse Rate: 76 (05/09 1033)  Labs:  Recent Labs  11/07/12 1525 11/07/12 2131 11/08/12 0318 11/08/12 0319 11/08/12 0936 11/09/12 0531  HGB 11.2*  --   --  10.3*  --  10.3*  HCT 34.9*  --   --  32.2*  --  32.0*  PLT 228  --   --  194  --  183  APTT 28  --   --   --   --   --   LABPROT 19.8*  --   --  19.4*  --  23.1*  INR 1.75*  --   --  1.70*  --  2.15*  CREATININE 0.62  --   --  0.61  --   --   TROPONINI <0.30 <0.30 <0.30  --  <0.30  --     Estimated Creatinine Clearance: 86.5 ml/min (by C-G formula based on Cr of 0.61).  Medications:  Prescriptions prior to admission  Medication Sig Dispense Refill  . acetaminophen (TYLENOL) 500 MG tablet Take 1,000 mg by mouth daily as needed. For pain      . citalopram (CELEXA) 20 MG tablet Take 20 mg by mouth every morning.       . furosemide (LASIX) 40 MG tablet Take 40 mg by mouth daily as needed. Swelling      . glipiZIDE (GLUCOTROL) 10 MG tablet Take 10 mg by mouth 2 (two) times daily.       . insulin glargine (LANTUS) 100 UNIT/ML injection Inject 50 Units into the skin at bedtime. *ONLY takes when blood sugar levels exceed 200*      . levothyroxine (SYNTHROID, LEVOTHROID) 175 MCG tablet Take 175 mcg by mouth every morning.      . loperamide (IMODIUM) 2 MG capsule Take 2 mg by mouth 4 (four) times daily as needed. Diarrhea      . LORazepam (ATIVAN) 1 MG tablet Take 1 mg by mouth 3 (three) times daily as needed. For anxiety      . metaxalone (SKELAXIN) 800 MG tablet Take 800 mg by mouth daily.       . metFORMIN (GLUCOPHAGE) 500 MG  tablet Take 1,000 mg by mouth 2 (two) times daily with a meal.      . metoCLOPramide (REGLAN) 10 MG tablet Take 10 mg by mouth 4 (four) times daily.       . metoprolol tartrate (LOPRESSOR) 100 MG tablet Take 1 tablet (100 mg total) by mouth 2 (two) times daily.  60 tablet  6  . nitroGLYCERIN (NITROSTAT) 0.4 MG SL tablet Place 0.4 mg under the tongue as needed. For chest pain       . omeprazole (PRILOSEC) 20 MG capsule Take 20 mg by mouth 2 (two) times daily.        . ondansetron (ZOFRAN) 8 MG tablet Take 8 mg by mouth daily as needed. nausea      . oxyCODONE-acetaminophen (PERCOCET/ROXICET) 5-325 MG per tablet Take 1 tablet by mouth every 6 (six) hours as needed for pain.       . potassium chloride SA (K-DUR,KLOR-CON) 20 MEQ tablet Take 20  mEq by mouth 3 (three) times daily.       . simvastatin (ZOCOR) 40 MG tablet Take 40 mg by mouth at bedtime.       Marland Kitchen warfarin (COUMADIN) 2.5 MG tablet Take 1.25-2.5 mg by mouth daily. 2.5 mg on Tues, Thurs, and Sat. 1.25 on Mon, Wed, Fri and Sun.        Assessment: Pt on chronic coumadin for afib.  Home dose listed above.  INR subtherapeutic on admission.   INR has trended up to therapeutic range with dose increase.    Estimated Creatinine Clearance: 86.5 ml/min (by C-G formula based on Cr of 0.61).  Goal of Therapy:  INR 2-3 Monitor platelets by anticoagulation protocol: Yes   Plan:  Coumadin 2.5mg  PO today x 1 D/C Lovenox today Monitor CBC INR daily  [name], [name] A 11/09/2012,10:36 AM

## 2012-11-09 NOTE — Progress Notes (Signed)
NAMEGENESEE, NASE                 ACCOUNT NO.:  [id number]  MEDICAL RECORD NO.:  [id number]  LOCATION:  A323                          FACILITY:  [facility]  PHYSICIAN:  [name], M.D.DATE OF BIRTH:  11/21/1950  DATE OF PROCEDURE: DATE OF DISCHARGE:                                PROGRESS NOTE   SUBJECTIVE:  She feels somewhat better.  She was seen by Dr. Gerilyn Pilgrim, neurologist, yesterday.  I reviewed his note.  Apparently she has had a number of occasions where she has found herself on the ground with no apparent reason and no recollection of the event.  OBJECTIVE:  VITAL SIGNS:  Temperature is 97.8, pulse 74, respiratory rate 20, blood pressure 95/60. GENERAL:  She is supine in bed.  Her speech appears to be normal but halting. LUNGS:  Clear throughout. HEART:  Has an irregular irregularity.  Rate about 80.  ASSESSMENT: 1. Question partial complex seizures, as suggested by Dr. Gerilyn Pilgrim. 2. Atrial fibrillation, currently rate controlled. 3. Type 2 diabetes. 4. Question tardive dyskinesia secondary to citalopram.  PLAN:  I reviewed the note from Dr. Gerilyn Pilgrim.  Citalopram will be discontinued.  An EEG is pending.  She is now on Keppra.     [name], M.D.     SDK/MEDQ  D:  11/09/2012  T:  11/09/2012  Job:  [id number]

## 2012-11-09 NOTE — Progress Notes (Signed)
Inpatient Diabetes Program Recommendations  AACE/ADA: New Consensus Statement on Inpatient Glycemic Control (2013)  Target Ranges:  Prepandial:   less than 140 mg/dL      Peak postprandial:   less than 180 mg/dL (1-2 hours)      Critically ill patients:  140 - 180 mg/dL   Results for LATIFFANY, HARWICK (MRN [id number]) as of 11/09/2012 09:08  Ref. Range 11/08/2012 07:45 11/08/2012 11:02 11/08/2012 12:25 11/08/2012 17:04 11/08/2012 20:42 11/09/2012 07:11  Glucose-Capillary Latest Range: 70-99 mg/dL 409 (H) 811 (H) 914 (H) 151 (H) 184 (H) 173 (H)    Inpatient Diabetes Program Recommendations Insulin - Basal: Please consider increasing Lantus to 28 units QHS.  Note:  Patient has a history of diabetes and takes Lantus 50 units QHS and Glipizide 10 mg BID at home for diabetes management.  Currently, patient is ordered to receive Lantus 25 units QHS and Novolog 0-15 units AC for inpatient glycemic control.  Blood glucose has ranged from 151-226 mg/dl over the past 24 hours.  Please consider increasing basal insulin; recommend Lantus 28 units QHS.  Will continue to follow.  Thanks, Orlando Penner, RN, BSN, CCRN Diabetes Coordinator Inpatient Diabetes Program [phone]

## 2012-11-09 NOTE — Progress Notes (Signed)
EEG Completed. Results pending.

## 2012-11-10 LAB — PROTIME-INR: INR: 2.23 — ABNORMAL HIGH (ref 0.00–1.49)

## 2012-11-10 LAB — GLUCOSE, CAPILLARY

## 2012-11-10 MED ORDER — CIPROFLOXACIN HCL 500 MG PO TABS: 500.0000 mg | ORAL_TABLET | Freq: Two times a day (BID) | ORAL | Status: DC

## 2012-11-10 MED ORDER — LEVETIRACETAM 250 MG PO TABS: 250.0000 mg | ORAL_TABLET | Freq: Two times a day (BID) | ORAL | Status: DC

## 2012-11-10 MED ORDER — WARFARIN SODIUM 2.5 MG PO TABS: 2.5000 mg | ORAL_TABLET | Freq: Once | ORAL | Status: DC

## 2012-11-10 NOTE — Progress Notes (Signed)
[name], [name]                 ACCOUNT NO.:  [id number]  MEDICAL RECORD NO.:  [id number]  LOCATION:  A323                          FACILITY:  [facility]  PHYSICIAN:  [name], M.D.DATE OF BIRTH:  11/01/1950  DATE OF PROCEDURE: DATE OF DISCHARGE:  11/10/2012                                PROGRESS NOTE   SUBJECTIVE:  She is generally doing well.  She did have a weak spell this morning.  She has feelings at times that she feels really hot.  OBJECTIVE:  Temperature is 98.5, pulse 77, respiratory rate 20, blood pressure 123/77.  Sitting up in bed.  Her husband and daughter in the room.  Her speech is normal but slowed.  She still seems to have some proptosis.  Her TSH is 1.940, glucose being in the 100-200 range.  ASSESSMENT:  Probable seizure disorder.  PLAN:  She is now on Keppra.  EEG results are pending.  If she has not been seen by Cardiology by tomorrow I will consider discharging her.     [name], M.D.     SDK/MEDQ  D:  11/09/2012  T:  11/10/2012  Job:  [id number]

## 2012-11-10 NOTE — Progress Notes (Signed)
Pt and her daughter verbalize understanding of d/c instructions, 2 new medications, and follow up appts needed with Dr. [name] in 2 weeks and Dr. Gerilyn Pilgrim in 1-2wks. IV d/c no questions at this time. Pt d/c via wheelchair, accompanied by NT and pts daughter. Sheryn Bison, RN

## 2012-11-10 NOTE — Progress Notes (Signed)
ANTICOAGULATION CONSULT NOTE   Pharmacy Consult for Coumadin Indication: atrial fibrillation  Allergies  Allergen Reactions  . Codeine Nausea And Vomiting    HALLUCINATIONS   Patient Measurements: Height: 5\' 6"  (167.6 cm) Weight: 223 lb (101.152 kg) IBW/kg (Calculated) : 59.3  Vital Signs: Temp: 98 F (36.7 C) (05/10 0643) Temp src: Oral (05/10 0643) BP: 141/71 mmHg (05/10 0643) Pulse Rate: 67 (05/10 0643)  Labs:  Recent Labs  11/07/12 1525 11/07/12 2131 11/08/12 0318 11/08/12 0319 11/08/12 0936 11/09/12 0531 11/10/12 0556  HGB 11.2*  --   --  10.3*  --  10.3*  --   HCT 34.9*  --   --  32.2*  --  32.0*  --   PLT 228  --   --  194  --  183  --   APTT 28  --   --   --   --   --   --   LABPROT 19.8*  --   --  19.4*  --  23.1* 23.7*  INR 1.75*  --   --  1.70*  --  2.15* 2.23*  CREATININE 0.62  --   --  0.61  --   --   --   TROPONINI <0.30 <0.30 <0.30  --  <0.30  --   --     Estimated Creatinine Clearance: 86.5 ml/min (by C-G formula based on Cr of 0.61).  Medications:  Prescriptions prior to admission  Medication Sig Dispense Refill  . acetaminophen (TYLENOL) 500 MG tablet Take 1,000 mg by mouth daily as needed. For pain      . furosemide (LASIX) 40 MG tablet Take 40 mg by mouth daily as needed. Swelling      . glipiZIDE (GLUCOTROL) 10 MG tablet Take 10 mg by mouth 2 (two) times daily.       . insulin glargine (LANTUS) 100 UNIT/ML injection Inject 50 Units into the skin at bedtime. *ONLY takes when blood sugar levels exceed 200*      . levothyroxine (SYNTHROID, LEVOTHROID) 175 MCG tablet Take 175 mcg by mouth every morning.      . loperamide (IMODIUM) 2 MG capsule Take 2 mg by mouth 4 (four) times daily as needed. Diarrhea      . LORazepam (ATIVAN) 1 MG tablet Take 1 mg by mouth 3 (three) times daily as needed. For anxiety      . metaxalone (SKELAXIN) 800 MG tablet Take 800 mg by mouth daily.       . metFORMIN (GLUCOPHAGE) 500 MG tablet Take 1,000 mg by mouth 2  (two) times daily with a meal.      . metoCLOPramide (REGLAN) 10 MG tablet Take 10 mg by mouth 4 (four) times daily.       . metoprolol tartrate (LOPRESSOR) 100 MG tablet Take 1 tablet (100 mg total) by mouth 2 (two) times daily.  60 tablet  6  . nitroGLYCERIN (NITROSTAT) 0.4 MG SL tablet Place 0.4 mg under the tongue as needed. For chest pain       . omeprazole (PRILOSEC) 20 MG capsule Take 20 mg by mouth 2 (two) times daily.        . ondansetron (ZOFRAN) 8 MG tablet Take 8 mg by mouth daily as needed. nausea      . oxyCODONE-acetaminophen (PERCOCET/ROXICET) 5-325 MG per tablet Take 1 tablet by mouth every 6 (six) hours as needed for pain.       . potassium chloride SA (K-DUR,KLOR-CON) 20 MEQ tablet Take  20 mEq by mouth 3 (three) times daily.       . simvastatin (ZOCOR) 40 MG tablet Take 40 mg by mouth at bedtime.       Marland Kitchen warfarin (COUMADIN) 2.5 MG tablet Take 1.25-2.5 mg by mouth daily. 2.5 mg on Tues, Thurs, and Sat. 1.25 on Mon, Wed, Fri and Sun.      . [DISCONTINUED] citalopram (CELEXA) 20 MG tablet Take 20 mg by mouth every morning.        Assessment: Pt on chronic coumadin for afib.  Home dose listed above.  INR subtherapeutic on admission.   INR has trended up to therapeutic range with dose increase.    Estimated Creatinine Clearance: 86.5 ml/min (by C-G formula based on Cr of 0.61).  Goal of Therapy:  INR 2-3   Plan:  Repeat Coumadin 2.5mg  PO today x 1 INR daily  Mady Gemma 11/10/2012,9:27 AM

## 2012-11-11 NOTE — Discharge Summary (Signed)
[name], [name]                 ACCOUNT NO.:  [id number]  MEDICAL RECORD NO.:  [id number]  LOCATION:  A323                          FACILITY:  [facility]  PHYSICIAN:  [name], M.D.DATE OF BIRTH:  1951-05-11  DATE OF ADMISSION:  11/07/2012 DATE OF DISCHARGE:  05/10/2014LH                              DISCHARGE SUMMARY   HISTORY OF PRESENT ILLNESS:  This 62 year old was admitted to the hospital, 11/08/2012 due to sudden onset of severe fatigue, right arm weakness and tachycardia.  She had a benign 3 day hospitalization extending to 11/10/2012.  Vital signs remained stable.  Her admission white cell count was 11,300 of which 73% were neutrophils. CMP was essentially normal except for glucose of 158.  Troponins were less than 0.30.  Recheck white cell count was 8500 and 7000.  Vitamin B12 level was 257. Urine grew out greater than 100,000 CFUs of E. coli sensitive to everything except for ampicillin and sulfamethoxazole/trimethoprim. She was treated with sliding scale insulin, Lantus insulin, levothyroxine 175 mcg daily, loperamide 2 mg p.r.n., lorazepam 1 mg t.i.d. p.r.n., metoclopramide 10 mg q.i.d., metoprolol 100 mg b.i.d., ondansetron 4 mg q.4 hours p.r.n., oxycodone/APAP 5/325 q.6 hours p.r.n. pain, pantoprazole 40 mg daily.  Simvastatin 40 mg daily, warfarin as directed.  She was seen by Dr. Gerilyn Pilgrim, neurologist  who felt that she probably had a complex partial seizure, particularly since she has had a number of other episodes of the same description in the recent past.  She was started on Keppra 250 mg b.i.d.  EEG was read as negative, but it was still felt that the most likely diagnosis was complex partial seizures.  She is ready for discharge home on Nov 10, 2012.  She was discharged home on Keppra 500 mg b.i.d. (60 with 11 refills). Cipro 500 mg b.i.d. for 10 days (20 with no refills).  Vitamin B12 1000 mcg 2 daily, and her admission meds including  furosemide 40 mg daily p.r.n. fluid, glipizide 10 mg b.i.d., Lantus insulin 50 units at bedtime, which the patient only takes when the blood sugar is greater than 200, levothyroxine 175 mcg q.a.m., loperamide 2 mg q.4 hours p.r.n. diarrhea, lorazepam 1 mg t.i.d. p.r.n. anxiety, metaxalone 800 mg daily for muscle spasm, metformin 1000 mg b.i.d., metoclopramide 10 mg q.i.d., metoprolol 100 mg b.i.d. omeprazole 20 mg daily.  Ondansetron 8 mg daily for nausea, oxycodone/APAP 5/325 every 6 hours p.r.n. pain, potassium chloride 20 mEq daily. Simvastatin 40 mg at bedtime.  Warfarin as directed.  There was a question of tardive dyskinesia, which was also addressed by Dr. Gerilyn Pilgrim.  It was decided that of all the drugs that she was on the only one that might have explained her tardive dyskinesia like symptoms was the citalopram so we recommended that this be stopped.  FINAL DISCHARGE DIAGNOSES: 1. Syncopal episodes, probably secondary to complex partial seizures. 2. E. coli urinary tract infection 3. Diabetes. 4. Tardive dyskinesia. 5. Benign essential hypertension. 6. Diabetic gastroparesis. 7. Hypothyroidism. 8. Obesity. 9. Reflux esophagitis with Barrett's esophagus. 10.IBS. 11.Paroxysm atrial fibrillation. 12.Anticoagulation with warfarin (long-term use of anticoagulants).  I recommend the patient have her INR checked in several days while  she is on the Cipro.  Follow up will be in the office within 2 weeks and with Dr. Gerilyn Pilgrim.  She will schedule an appointment with him.     [name], M.D.     SDK/MEDQ  D:  11/10/2012  T:  11/11/2012  Job:  [id number]

## 2012-11-12 ENCOUNTER — Encounter: Payer: Self-pay | Admitting: *Deleted

## 2012-11-14 ENCOUNTER — Ambulatory Visit (INDEPENDENT_AMBULATORY_CARE_PROVIDER_SITE_OTHER): Payer: Medicare Other | Admitting: *Deleted

## 2012-11-14 DIAGNOSIS — I4891 Unspecified atrial fibrillation: Secondary | ICD-10-CM

## 2012-11-14 LAB — POCT INR: INR: 2

## 2012-11-25 ENCOUNTER — Emergency Department (HOSPITAL_COMMUNITY)
Admission: EM | Admit: 2012-11-25 | Discharge: 2012-11-25 | Disposition: A | Discharge status: Home / Self Care | Payer: Medicare Other | Attending: Emergency Medicine | Admitting: Emergency Medicine

## 2012-11-25 ENCOUNTER — Encounter (HOSPITAL_COMMUNITY): Payer: Self-pay | Admitting: Emergency Medicine

## 2012-11-25 DIAGNOSIS — Z87891 Personal history of nicotine dependence: Secondary | ICD-10-CM | POA: Insufficient documentation

## 2012-11-25 DIAGNOSIS — I509 Heart failure, unspecified: Secondary | ICD-10-CM | POA: Insufficient documentation

## 2012-11-25 DIAGNOSIS — Z794 Long term (current) use of insulin: Secondary | ICD-10-CM | POA: Insufficient documentation

## 2012-11-25 DIAGNOSIS — E039 Hypothyroidism, unspecified: Secondary | ICD-10-CM | POA: Insufficient documentation

## 2012-11-25 DIAGNOSIS — E785 Hyperlipidemia, unspecified: Secondary | ICD-10-CM | POA: Insufficient documentation

## 2012-11-25 DIAGNOSIS — K219 Gastro-esophageal reflux disease without esophagitis: Secondary | ICD-10-CM | POA: Insufficient documentation

## 2012-11-25 DIAGNOSIS — E860 Dehydration: Secondary | ICD-10-CM | POA: Insufficient documentation

## 2012-11-25 DIAGNOSIS — Z862 Personal history of diseases of the blood and blood-forming organs and certain disorders involving the immune mechanism: Secondary | ICD-10-CM | POA: Insufficient documentation

## 2012-11-25 DIAGNOSIS — I10 Essential (primary) hypertension: Secondary | ICD-10-CM | POA: Insufficient documentation

## 2012-11-25 DIAGNOSIS — Z8719 Personal history of other diseases of the digestive system: Secondary | ICD-10-CM | POA: Insufficient documentation

## 2012-11-25 DIAGNOSIS — Z8669 Personal history of other diseases of the nervous system and sense organs: Secondary | ICD-10-CM | POA: Insufficient documentation

## 2012-11-25 DIAGNOSIS — Z79899 Other long term (current) drug therapy: Secondary | ICD-10-CM | POA: Insufficient documentation

## 2012-11-25 DIAGNOSIS — R197 Diarrhea, unspecified: Secondary | ICD-10-CM | POA: Insufficient documentation

## 2012-11-25 DIAGNOSIS — Z9981 Dependence on supplemental oxygen: Secondary | ICD-10-CM | POA: Insufficient documentation

## 2012-11-25 DIAGNOSIS — G4733 Obstructive sleep apnea (adult) (pediatric): Secondary | ICD-10-CM | POA: Insufficient documentation

## 2012-11-25 DIAGNOSIS — R5381 Other malaise: Secondary | ICD-10-CM | POA: Insufficient documentation

## 2012-11-25 DIAGNOSIS — G40909 Epilepsy, unspecified, not intractable, without status epilepticus: Secondary | ICD-10-CM | POA: Insufficient documentation

## 2012-11-25 DIAGNOSIS — R4789 Other speech disturbances: Secondary | ICD-10-CM | POA: Insufficient documentation

## 2012-11-25 DIAGNOSIS — R51 Headache: Secondary | ICD-10-CM | POA: Insufficient documentation

## 2012-11-25 DIAGNOSIS — Z8679 Personal history of other diseases of the circulatory system: Secondary | ICD-10-CM | POA: Insufficient documentation

## 2012-11-25 DIAGNOSIS — E669 Obesity, unspecified: Secondary | ICD-10-CM | POA: Insufficient documentation

## 2012-11-25 DIAGNOSIS — G8929 Other chronic pain: Secondary | ICD-10-CM | POA: Insufficient documentation

## 2012-11-25 DIAGNOSIS — E119 Type 2 diabetes mellitus without complications: Secondary | ICD-10-CM | POA: Insufficient documentation

## 2012-11-25 DIAGNOSIS — Z7901 Long term (current) use of anticoagulants: Secondary | ICD-10-CM | POA: Insufficient documentation

## 2012-11-25 DIAGNOSIS — I4891 Unspecified atrial fibrillation: Secondary | ICD-10-CM | POA: Insufficient documentation

## 2012-11-25 DIAGNOSIS — Z9889 Other specified postprocedural states: Secondary | ICD-10-CM | POA: Insufficient documentation

## 2012-11-25 DIAGNOSIS — R5383 Other fatigue: Secondary | ICD-10-CM

## 2012-11-25 DIAGNOSIS — R609 Edema, unspecified: Secondary | ICD-10-CM | POA: Insufficient documentation

## 2012-11-25 LAB — CBC WITH DIFFERENTIAL/PLATELET
Basophils Absolute: 0 10*3/uL (ref 0.0–0.1)
Eosinophils Relative: 2 % (ref 0–5)
Lymphocytes Relative: 33 % (ref 12–46)
Lymphs Abs: 2.4 10*3/uL (ref 0.7–4.0)
MCV: 87.6 fL (ref 78.0–100.0)
Neutrophils Relative %: 55 % (ref 43–77)
Platelets: 242 10*3/uL (ref 150–400)
RBC: 3.86 MIL/uL — ABNORMAL LOW (ref 3.87–5.11)
RDW: 15.2 % (ref 11.5–15.5)
WBC: 7.3 10*3/uL (ref 4.0–10.5)

## 2012-11-25 LAB — BASIC METABOLIC PANEL
CO2: 23 mEq/L (ref 19–32)
Calcium: 8.8 mg/dL (ref 8.4–10.5)
GFR calc non Af Amer: >90 mL/min (ref >90)
Glucose, Bld: 195 mg/dL — ABNORMAL HIGH (ref 70–99)
Potassium: 3.9 mEq/L (ref 3.5–5.1)
Sodium: 140 mEq/L (ref 135–145)

## 2012-11-25 LAB — URINALYSIS, ROUTINE W REFLEX MICROSCOPIC
Glucose, UA: NEGATIVE mg/dL
Leukocytes, UA: NEGATIVE
Protein, ur: NEGATIVE mg/dL
Specific Gravity, Urine: >1.03 — ABNORMAL HIGH (ref 1.005–1.030)
Urobilinogen, UA: 0.2 mg/dL (ref 0.0–1.0)

## 2012-11-25 LAB — GLUCOSE, CAPILLARY: Glucose-Capillary: 171 mg/dL — ABNORMAL HIGH (ref 70–99)

## 2012-11-25 LAB — TROPONIN I: Troponin I: <0.3 ng/mL (ref <0.30)

## 2012-11-25 MED ORDER — LORAZEPAM 2 MG/ML IJ SOLN
0.5000 mg | Freq: Once | INTRAMUSCULAR | Status: DC
  Filled 2012-11-25: qty 1

## 2012-11-25 MED ORDER — ONDANSETRON 4 MG PO TBDP: 4.0000 mg | ORAL_TABLET | Freq: Three times a day (TID) | ORAL | Status: DC | PRN

## 2012-11-25 MED ORDER — SODIUM CHLORIDE 0.9 % IV BOLUS (SEPSIS)
500.0000 mL | Freq: Once | INTRAVENOUS | Status: AC
  Administered 2012-11-25: 500 mL via INTRAVENOUS

## 2012-11-25 MED ORDER — LORAZEPAM 1 MG PO TABS
0.5000 mg | ORAL_TABLET | Freq: Once | ORAL | Status: AC
Start: 2012-11-25 — End: 2012-11-25
  Administered 2012-11-25: 0.5 mg via ORAL
  Filled 2012-11-25: qty 1

## 2012-11-25 NOTE — ED Notes (Signed)
Pt c/o gen weakness and feels like her heart is futtering. Marland Kitchenalert/orietned. Slurred speech but daughter states is her normal. Denies cp/pressure/tightness. Denies sob/dizziness.

## 2012-11-25 NOTE — ED Provider Notes (Signed)
History  This chart was scribed for [name], MD by Bennett Scrape, ED Scribe. This patient was seen in room [facility]04/[facility]04 and the patient's care was started at 5:14 PM.   CSN: [id number]  Arrival date & time 11/25/12  1659   First MD Initiated Contact with Patient 11/25/12 1713      Chief Complaint  Patient presents with  . Weakness     The history is provided by the patient and a relative. No language interpreter was used.   HPI Comments: [name] is a 62 y.o. female who presents to the Emergency Department complaining of gradual onset, gradually worsening, constant weaknes which began this morning upon waking. She describes the weakness as feeling as if her whole body lacks energy. She is ambulatory and reports that yesterday she engaged in her normal activities of daily living. The pt states that she has experienced a headache for the past two days and has had occasional diarrhea, which is normal for her. She denies any change in her stools, abdominal pain, nausea, emesis, polyuria, hematuria, back pain, blurry vision, swelling in the extremities, or rashes. She has a h/o of A-fib and was diagnosed with seizures two weeks after a seizure was witnessed. The pt's daughter reports that she may have had prior episodes of possible seizures, but they were not witnessed. She has taken Keppra for the past two weeks.  She is also taking an antibiotic for UTI which was diagnosed two weeks ago when she was admitted for the seizure.  Her daughter also reports that the pt has had slurred speech for about 6 months possibly due to tardive dyskinesia from taking citalopram which she is not currently on anymore. She denies any prior CVA,TIA or CA diagnoses. Pt has a h/o of DM, CHF, and HTN. She is a former smoker, and she denies drinking.   Past Medical History  Diagnosis Date  . Chest pain     Negative cardiac catheterization in 2002; negative stress nuclear study in 2008  . Congestive heart  failure     Normal LV systolic function  . PAF (paroxysmal atrial fibrillation) 2009  . Chronic anticoagulation   . Diabetes mellitus, type 2     Insulin therapy; exacerbated by prednisone  . Syncope     Admitted 05/2009; magnetic resonance imagin/ MRA - negative; etiology thought to be orthostasis secondary to drugs and dehydration  . GERD (gastroesophageal reflux disease)   . IBS (irritable bowel syndrome)   . Hiatal hernia   . Gastroparesis     99% retention 05/2008 on GES  . Hyperlipidemia   . Hypertension   . Hypothyroid   . Chronic LBP     Surgical intervention in 1996  . Obesity   . OSA on CPAP   . Anemia     H/H of 10/30 with a normal MCV in 12/09  . Barrett's esophagus     Diagnosed 1995. Last EGD 2008.     Past Surgical History  Procedure Laterality Date  . Cardiovascular stress test  2008    Stress nuclear study  . Back surgery  1996  . Laparoscopic cholecystectomy  1990s  . Total abdominal hysterectomy  1999  . Laminectomy  1995    L4-L5  . Carpal tunnel release  1994  . Esophagogastroduodenoscopy  2008    Barrett's without dysplasia. esphagus dilated. antral erosions, h.pylori serologies negative.  . Colonoscopy  2013    Patient reports normal TCS around 10 years ago;  and a repeat study in 2013 that was negative  . Givens capsule study  12/07/2011    Procedure: GIVENS CAPSULE STUDY;  Surgeon: West Bali, MD;  Location: [facility];  Service: Endoscopy;  Laterality: N/A;  . Cardiac catheterization  2002    Family History  Problem Relation Age of Onset  . Hypertension Mother   . Alzheimer's disease Mother   . Stroke Mother   . Heart attack Mother   . Heart disease Neg Hx   . Hypertension Other   . Colon cancer Neg Hx     History  Substance Use Topics  . Smoking status: Former Games developer  . Smokeless tobacco: Former Neurosurgeon     Comment: Remote  . Alcohol Use: No     Comment: last etoh one year ago, never frequent     Review of Systems   Constitutional: Negative for fever, chills, activity change, appetite change and fatigue.  HENT: Negative for congestion, sore throat, rhinorrhea and neck pain.   Gastrointestinal: Positive for diarrhea (chronic). Negative for nausea and vomiting.  Genitourinary: Negative for frequency and hematuria.  Musculoskeletal: Negative for back pain.  Neurological: Positive for speech difficulty (chronic), weakness and headaches. Negative for numbness.  All other systems reviewed and are negative.    Allergies  Codeine  Home Medications   Current Outpatient Rx  Name  Route  Sig  Dispense  Refill  . glipiZIDE (GLUCOTROL) 10 MG tablet   Oral   Take 10 mg by mouth 2 (two) times daily.          . insulin glargine (LANTUS) 100 UNIT/ML injection   Subcutaneous   Inject 50 Units into the skin at bedtime. *ONLY takes when blood sugar levels exceed 200*         . levETIRAcetam (KEPPRA) 250 MG tablet   Oral   Take 1 tablet (250 mg total) by mouth 2 (two) times daily.   60 tablet   11   . levothyroxine (SYNTHROID, LEVOTHROID) 175 MCG tablet   Oral   Take 175 mcg by mouth every morning.         Marland Kitchen LORazepam (ATIVAN) 1 MG tablet   Oral   Take 1 mg by mouth 3 (three) times daily as needed. For anxiety         . metFORMIN (GLUCOPHAGE) 500 MG tablet   Oral   Take 1,000 mg by mouth 2 (two) times daily with a meal.         . metoCLOPramide (REGLAN) 10 MG tablet   Oral   Take 10 mg by mouth 4 (four) times daily.          . metoprolol tartrate (LOPRESSOR) 100 MG tablet   Oral   Take 1 tablet (100 mg total) by mouth 2 (two) times daily.   60 tablet   6   . omeprazole (PRILOSEC) 20 MG capsule   Oral   Take 20 mg by mouth 2 (two) times daily.           . ondansetron (ZOFRAN) 8 MG tablet   Oral   Take 8 mg by mouth daily as needed. nausea         . potassium chloride SA (K-DUR,KLOR-CON) 20 MEQ tablet   Oral   Take 20 mEq by mouth 3 (three) times daily.          .  simvastatin (ZOCOR) 40 MG tablet   Oral   Take 40 mg by mouth at bedtime.          Marland Kitchen  warfarin (COUMADIN) 2.5 MG tablet   Oral   Take 1.25-2.5 mg by mouth daily. 2.5 mg on Tues, Thurs, and Sat. 1.25 on Mon, Wed, Fri and Sun.         Marland Kitchen acetaminophen (TYLENOL) 500 MG tablet   Oral   Take 1,000 mg by mouth daily as needed. For pain         . furosemide (LASIX) 40 MG tablet   Oral   Take 40 mg by mouth daily as needed. Swelling         . loperamide (IMODIUM) 2 MG capsule   Oral   Take 2 mg by mouth 4 (four) times daily as needed. Diarrhea         . metaxalone (SKELAXIN) 800 MG tablet   Oral   Take 800 mg by mouth daily.          . nitroGLYCERIN (NITROSTAT) 0.4 MG SL tablet   Sublingual   Place 0.4 mg under the tongue as needed. For chest pain          . ondansetron (ZOFRAN ODT) 4 MG disintegrating tablet   Oral   Take 1 tablet (4 mg total) by mouth every 8 (eight) hours as needed for nausea.   10 tablet   0     BP 134/70  Pulse 95  Temp(Src) 98.4 F (36.9 C) (Oral)  Resp 18  SpO2 98%  Physical Exam  Nursing note and vitals reviewed. Constitutional: She is oriented to person, place, and time. She appears well-developed and well-nourished. No distress.  HENT:  Head: Normocephalic and atraumatic.  Nose: Nose normal.  Mouth/Throat: Oropharynx is clear and moist.  Moist MM  Eyes: EOM are normal. Pupils are equal, round, and reactive to light.  Neck: Neck supple. No tracheal deviation present.  Cardiovascular: Normal heart sounds.   Tachycardic, irregularly irregular.   Pulmonary/Chest: Effort normal and breath sounds normal. No respiratory distress.  Abdominal: Soft. There is no tenderness.  Musculoskeletal: Normal range of motion. She exhibits edema.  Right lower leg is slightly asymmetric to the left. Slight pitting of right leg, and no pitting of left leg.   Neurological: She is alert and oriented to person, place, and time.  Normal: strength,  sensation, cranial nerves 3-12, and coordination. Follows commands Slight slurred speech  (daughter says present for 6 months) Tongue rolling also present for months  Skin: Skin is warm.  Clammy to the touch.   Psychiatric: She has a normal mood and affect. Her behavior is normal.    ED Course  Procedures (including critical care time)  DIAGNOSTIC STUDIES:  Oxygen Saturation is 98% on room air, normal by my interpretation.    COORDINATION OF CARE:  5:24 PM Discussed treatment plan with pt which includes a test to verify if her UTI has been treated adequately.  Pt agreed to plan.    Labs Reviewed  CBC WITH DIFFERENTIAL - Abnormal; Notable for the following:    RBC 3.86 (*)    Hemoglobin 10.6 (*)    HCT 33.8 (*)    All other components within normal limits  BASIC METABOLIC PANEL - Abnormal; Notable for the following:    Glucose, Bld 195 (*)    All other components within normal limits  URINALYSIS, ROUTINE W REFLEX MICROSCOPIC - Abnormal; Notable for the following:    Specific Gravity, Urine >1.030 (*)    Ketones, ur TRACE (*)    All other components within normal limits  GLUCOSE, CAPILLARY - Abnormal;  Notable for the following:    Glucose-Capillary 171 (*)    All other components within normal limits  TROPONIN I   No results found.   1. Fatigue   2. Dehydration       MDM  The patient presents with generalized fatigue but has no focal or lateralizing symptoms. She does have atrial fibrillation with rapid ventricular rate at pulse of 105 beats per minute but no other signs of ischemia. Her initial laboratory workup shows that she has a normal CBC other than mild anemia and a normal basic metabolic panel. Troponin is pending, urinalysis is pending.   ED ECG REPORT  I personally interpreted this EKG   Date: 11/25/2012   Rate: 105  Rhythm: atrial fibrillation  QRS Axis: left  Intervals: normal  ST/T Wave abnormalities: nonspecific T wave changes  Conduction  Disutrbances:none  Narrative Interpretation:   Old EKG Reviewed: Compared with 11/07/2012, no significant changes  The patient has an essentially normal testing including a urinalysis that did show dehydration.  I informed the patient of all of her results, she has expressed her understanding to the instructions for discharge, has been tolerating oral fluids and has been ambulatory around the room without difficulty. She stays with the family member at home who will watch her for the next 24 hours and bring her back should she worsen. She has a followup with her family doctor in 5 days.   I personally performed the services described in this documentation, which was scribed in my presence. The recorded information has been reviewed and is accurate.      [name], MD 11/25/12 [phone]

## 2012-11-28 ENCOUNTER — Ambulatory Visit (INDEPENDENT_AMBULATORY_CARE_PROVIDER_SITE_OTHER): Payer: Medicare Other | Admitting: *Deleted

## 2012-11-28 DIAGNOSIS — I4891 Unspecified atrial fibrillation: Secondary | ICD-10-CM

## 2012-11-28 NOTE — Patient Instructions (Signed)
Instructed anticoagulation safety measures, if pt has a fall go directly to ed

## 2012-12-06 ENCOUNTER — Ambulatory Visit (INDEPENDENT_AMBULATORY_CARE_PROVIDER_SITE_OTHER): Payer: Medicare Other | Admitting: *Deleted

## 2012-12-06 DIAGNOSIS — I4891 Unspecified atrial fibrillation: Secondary | ICD-10-CM

## 2012-12-20 ENCOUNTER — Ambulatory Visit (INDEPENDENT_AMBULATORY_CARE_PROVIDER_SITE_OTHER): Payer: Medicare Other | Admitting: *Deleted

## 2012-12-20 ENCOUNTER — Other Ambulatory Visit: Payer: Self-pay | Admitting: Cardiology

## 2012-12-20 DIAGNOSIS — I4891 Unspecified atrial fibrillation: Secondary | ICD-10-CM

## 2012-12-26 ENCOUNTER — Ambulatory Visit (INDEPENDENT_AMBULATORY_CARE_PROVIDER_SITE_OTHER): Payer: Medicare Other | Admitting: *Deleted

## 2012-12-26 ENCOUNTER — Encounter: Payer: Self-pay | Admitting: Cardiology

## 2012-12-26 ENCOUNTER — Ambulatory Visit (INDEPENDENT_AMBULATORY_CARE_PROVIDER_SITE_OTHER): Payer: Medicare Other | Admitting: Cardiology

## 2012-12-26 VITALS — BP 136/83 | HR 82 | Ht 66.0 in | Wt 221.2 lb

## 2012-12-26 DIAGNOSIS — I4891 Unspecified atrial fibrillation: Secondary | ICD-10-CM

## 2012-12-26 DIAGNOSIS — E785 Hyperlipidemia, unspecified: Secondary | ICD-10-CM

## 2012-12-26 DIAGNOSIS — R251 Tremor, unspecified: Secondary | ICD-10-CM

## 2012-12-26 DIAGNOSIS — R259 Unspecified abnormal involuntary movements: Secondary | ICD-10-CM

## 2012-12-26 DIAGNOSIS — I10 Essential (primary) hypertension: Secondary | ICD-10-CM

## 2012-12-26 DIAGNOSIS — D509 Iron deficiency anemia, unspecified: Secondary | ICD-10-CM

## 2012-12-26 DIAGNOSIS — K3184 Gastroparesis: Secondary | ICD-10-CM

## 2012-12-26 MED ORDER — FERROUS SULFATE 325 (65 FE) MG PO TABS: 325.0000 mg | ORAL_TABLET | Freq: Every day | ORAL | Status: DC

## 2012-12-26 NOTE — Assessment & Plan Note (Signed)
Extremely low total and LDL cholesterol. Current lipid-lowering therapy is effective and will be continued.

## 2012-12-26 NOTE — Progress Notes (Signed)
Patient ID: [name], female   DOB: 1951-01-03, 62 y.o.   MRN: [id number]  HPI: Scheduled return visit for this very nice woman, former long-term employee of [employer], follow for atrial fibrillation, chronic anticoagulation and episodic congestive heart failure with preserved left ventricular systolic function. She was hospitalized approximately 2 months ago with dehydration and did well with conservative therapy. She has had progressive neurologic abnormalities and is now thought to have a seizure disorder as well as tardive dyskinesia.  She is inactive, but denies dyspnea or chest discomfort. She's had no apparent problems with anticoagulation. Iron deficiency was evaluated with upper and lower endoscopy by Dr. Darrick Penna, which revealed Barrett's esophagus, small intestinal ulceration, gastroparesis and AVMs Current Outpatient Prescriptions  Medication Sig Dispense Refill  . acetaminophen (TYLENOL) 500 MG tablet Take 1,000 mg by mouth daily as needed. For pain      . furosemide (LASIX) 40 MG tablet Take 40 mg by mouth daily as needed. Swelling      . glipiZIDE (GLUCOTROL) 10 MG tablet Take 10 mg by mouth 2 (two) times daily.       . insulin glargine (LANTUS) 100 UNIT/ML injection Inject 50 Units into the skin at bedtime. *ONLY takes when blood sugar levels exceed 200*      . levETIRAcetam (KEPPRA) 250 MG tablet Take 1 tablet (250 mg total) by mouth 2 (two) times daily.  60 tablet  11  . levothyroxine (SYNTHROID, LEVOTHROID) 175 MCG tablet Take 175 mcg by mouth every morning.      . loperamide (IMODIUM) 2 MG capsule Take 2 mg by mouth 4 (four) times daily as needed. Diarrhea      . LORazepam (ATIVAN) 1 MG tablet Take 1 mg by mouth 3 (three) times daily as needed. For anxiety      . metaxalone (SKELAXIN) 800 MG tablet Take 800 mg by mouth daily.       . metFORMIN (GLUCOPHAGE) 500 MG tablet Take 1,000 mg by mouth 2 (two) times daily with a meal.      . metoCLOPramide (REGLAN) 10 MG tablet  Take 10 mg by mouth 4 (four) times daily.       . metoprolol tartrate (LOPRESSOR) 100 MG tablet Take 1 tablet (100 mg total) by mouth 2 (two) times daily.  60 tablet  6  . nitroGLYCERIN (NITROSTAT) 0.4 MG SL tablet Place 0.4 mg under the tongue as needed. For chest pain       . omeprazole (PRILOSEC) 20 MG capsule Take 20 mg by mouth 2 (two) times daily.        . ondansetron (ZOFRAN ODT) 4 MG disintegrating tablet Take 1 tablet (4 mg total) by mouth every 8 (eight) hours as needed for nausea.  10 tablet  0  . ondansetron (ZOFRAN) 8 MG tablet Take 8 mg by mouth daily as needed. nausea      . potassium chloride SA (K-DUR,KLOR-CON) 20 MEQ tablet Take 20 mEq by mouth 3 (three) times daily.       . simvastatin (ZOCOR) 40 MG tablet Take 40 mg by mouth at bedtime.       Marland Kitchen warfarin (COUMADIN) 2.5 MG tablet TAKE AS DIRECTED.  30 tablet  3   No current facility-administered medications for this visit.   Allergies  Allergen Reactions  . Codeine Nausea And Vomiting    HALLUCINATIONS     Past medical history, social history, and family history reviewed and updated.  ROS: Denies orthopnea, PND, weight gain,  palpitations, lightheadedness or syncope. She has had no melena nor hematochezia. All other systems reviewed and are negative.  PHYSICAL EXAM: BP 136/83  Pulse 82  Ht 5\' 6"  (1.676 m)  Wt 100.358 kg (221 lb 4 oz)  BMI 35.73 kg/m2;  Body mass index is 35.73 kg/(m^2). General-Well developed; no acute distress Body habitus-moderately overweight Neck-No JVD; no carotid bruits Lungs-clear lung fields; resonant to percussion Cardiovascular-normal PMI; normal S1 and S2; irregular rhythm with an apical rate of 80 bpm Abdomen-normal bowel sounds; soft and non-tender without masses or organomegaly Musculoskeletal-No deformities, no cyanosis or clubbing Neurologic-Normal cranial nerves; symmetric strength and tone Skin-Warm, no significant lesions Extremities-distal pulses intact; 1/2+ edema  Maury Bing, MD 12/26/2012  3:18 PM  ASSESSMENT AND PLAN

## 2012-12-26 NOTE — Assessment & Plan Note (Signed)
Tardive dyskinesia is more prominent than I have noted in the past. Metoclopramide may be contributing. Patient is scheduled for reassessment by Dr. Gerilyn Pilgrim.

## 2012-12-26 NOTE — Progress Notes (Deleted)
Name: [name]    DOB: 02-05-1951  Age: 62 y.o.  MR#: [id number]       PCP:  [name], MD      Insurance: Payor: Advertising copywriter MEDICARE / Plan: AARP MEDICARE COMPLETE / Product Type: *No Product type* /   CC:   No chief complaint on file.  NO LIST VS Filed Vitals:   12/26/12 1450  BP: 136/83  Pulse: 82  Height: 5\' 6"  (1.676 m)  Weight: 221 lb 4 oz (100.358 kg)    Weights Current Weight  12/26/12 221 lb 4 oz (100.358 kg)  11/08/12 223 lb (101.152 kg)  11/01/12 221 lb (100.245 kg)    Blood Pressure  BP Readings from Last 3 Encounters:  12/26/12 136/83  11/25/12 133/100  11/10/12 136/71     Admit date:  (Not on file) Last encounter with RMR:  12/20/2012   Allergy Codeine  Current Outpatient Prescriptions  Medication Sig Dispense Refill  . acetaminophen (TYLENOL) 500 MG tablet Take 1,000 mg by mouth daily as needed. For pain      . furosemide (LASIX) 40 MG tablet Take 40 mg by mouth daily as needed. Swelling      . glipiZIDE (GLUCOTROL) 10 MG tablet Take 10 mg by mouth 2 (two) times daily.       . insulin glargine (LANTUS) 100 UNIT/ML injection Inject 50 Units into the skin at bedtime. *ONLY takes when blood sugar levels exceed 200*      . levETIRAcetam (KEPPRA) 250 MG tablet Take 1 tablet (250 mg total) by mouth 2 (two) times daily.  60 tablet  11  . levothyroxine (SYNTHROID, LEVOTHROID) 175 MCG tablet Take 175 mcg by mouth every morning.      . loperamide (IMODIUM) 2 MG capsule Take 2 mg by mouth 4 (four) times daily as needed. Diarrhea      . LORazepam (ATIVAN) 1 MG tablet Take 1 mg by mouth 3 (three) times daily as needed. For anxiety      . metaxalone (SKELAXIN) 800 MG tablet Take 800 mg by mouth daily.       . metFORMIN (GLUCOPHAGE) 500 MG tablet Take 1,000 mg by mouth 2 (two) times daily with a meal.      . metoCLOPramide (REGLAN) 10 MG tablet Take 10 mg by mouth 4 (four) times daily.       . metoprolol tartrate (LOPRESSOR) 100 MG tablet Take 1 tablet  (100 mg total) by mouth 2 (two) times daily.  60 tablet  6  . nitroGLYCERIN (NITROSTAT) 0.4 MG SL tablet Place 0.4 mg under the tongue as needed. For chest pain       . omeprazole (PRILOSEC) 20 MG capsule Take 20 mg by mouth 2 (two) times daily.        . ondansetron (ZOFRAN ODT) 4 MG disintegrating tablet Take 1 tablet (4 mg total) by mouth every 8 (eight) hours as needed for nausea.  10 tablet  0  . ondansetron (ZOFRAN) 8 MG tablet Take 8 mg by mouth daily as needed. nausea      . potassium chloride SA (K-DUR,KLOR-CON) 20 MEQ tablet Take 20 mEq by mouth 3 (three) times daily.       . simvastatin (ZOCOR) 40 MG tablet Take 40 mg by mouth at bedtime.       Marland Kitchen warfarin (COUMADIN) 2.5 MG tablet TAKE AS DIRECTED.  30 tablet  3   No current facility-administered medications for this visit.    Discontinued Meds:  There are no discontinued medications.  Patient Active Problem List   Diagnosis Date Noted  . UTI (urinary tract infection) 11/07/2012  . Tremor 10/27/2012  . Chronic anticoagulation 06/23/2011  . HYPOTENSION, ORTHOSTATIC 08/26/2009  . HYPERLIPIDEMIA-MIXED 03/04/2009  . Barrett's esophagus 09/26/2008  . GASTROPARESIS 09/26/2008  . ANEMIA, IRON DEFICIENCY 09/25/2008  . HYPERTENSION 09/25/2008  . LUNG NODULE 09/25/2008  . Irritable bowel syndrome 09/25/2008  . SLEEP APNEA 09/25/2008  . CARPAL TUNNEL SYNDROME, HX OF 09/25/2008  . HYPOTHYROIDISM, PRIMARY 04/10/2008  . DIABETES MELLITUS, TYPE II, ON INSULIN 04/10/2008  . OBESITY 04/10/2008  . Atrial fibrillation 04/10/2008  . CONGESTIVE HEART FAILURE, MILD 04/10/2008  . GERD 04/10/2008  . LOW BACK PAIN, CHRONIC 04/10/2008  . CHEST PAIN-PRECORDIAL 04/10/2008    LABS    Component Value Date/Time   NA 140 11/25/2012 1714   NA 138 11/08/2012 0319   NA 139 11/07/2012 1525   K 3.9 11/25/2012 1714   K 4.0 11/08/2012 0319   K 4.3 11/07/2012 1525   CL 106 11/25/2012 1714   CL 107 11/08/2012 0319   CL 106 11/07/2012 1525   CO2 23 11/25/2012  1714   CO2 22 11/08/2012 0319   CO2 20 11/07/2012 1525   GLUCOSE 195* 11/25/2012 1714   GLUCOSE 187* 11/08/2012 0319   GLUCOSE 158* 11/07/2012 1525   BUN 11 11/25/2012 1714   BUN 13 11/08/2012 0319   BUN 14 11/07/2012 1525   CREATININE 0.61 11/25/2012 1714   CREATININE 0.61 11/08/2012 0319   CREATININE 0.62 11/07/2012 1525   CREATININE 0.76 04/04/2012 1033   CREATININE 1.58* 09/14/2011 1435   CALCIUM 8.8 11/25/2012 1714   CALCIUM 8.8 11/08/2012 0319   CALCIUM 8.9 11/07/2012 1525   GFRNONAA >90 11/25/2012 1714   GFRNONAA >90 11/08/2012 0319   GFRNONAA >90 11/07/2012 1525   GFRAA >90 11/25/2012 1714   GFRAA >90 11/08/2012 0319   GFRAA >90 11/07/2012 1525   CMP     Component Value Date/Time   NA 140 11/25/2012 1714   K 3.9 11/25/2012 1714   CL 106 11/25/2012 1714   CO2 23 11/25/2012 1714   GLUCOSE 195* 11/25/2012 1714   BUN 11 11/25/2012 1714   CREATININE 0.61 11/25/2012 1714   CREATININE 0.76 04/04/2012 1033   CALCIUM 8.8 11/25/2012 1714   PROT 6.7 11/07/2012 1525   ALBUMIN 3.7 11/07/2012 1525   AST 13 11/07/2012 1525   ALT 12 11/07/2012 1525   ALKPHOS 60 11/07/2012 1525   BILITOT 0.4 11/07/2012 1525   GFRNONAA >90 11/25/2012 1714   GFRAA >90 11/25/2012 1714       Component Value Date/Time   WBC 7.3 11/25/2012 1714   WBC 7.8 11/09/2012 0531   WBC 8.5 11/08/2012 0319   HGB 10.6* 11/25/2012 1714   HGB 10.3* 11/09/2012 0531   HGB 10.3* 11/08/2012 0319   HCT 33.8* 11/25/2012 1714   HCT 32.0* 11/09/2012 0531   HCT 32.2* 11/08/2012 0319   MCV 87.6 11/25/2012 1714   MCV 86.0 11/09/2012 0531   MCV 86.1 11/08/2012 0319    Lipid Panel     Component Value Date/Time   CHOL 77 09/02/2011 0446   TRIG 88 09/02/2011 0446   HDL 35* 09/02/2011 0446   CHOLHDL 2.2 09/02/2011 0446   VLDL 18 09/02/2011 0446   LDLCALC 24 09/02/2011 0446    ABG    Component Value Date/Time   TCO2 18 02/11/2009 2057     Lab Results  Component Value Date  TSH 1.940 11/09/2012   BNP (last 3 results) No results found for this basename: PROBNP,  in the last 8760  hours Cardiac Panel (last 3 results) No results found for this basename: CKTOTAL, CKMB, TROPONINI, RELINDX,  in the last 72 hours  Iron/TIBC/Ferritin    Component Value Date/Time   IRON 11* 09/02/2011 1119   TIBC 332 09/02/2011 1119   FERRITIN 9* 04/04/2012 1033     EKG Orders placed during the hospital encounter of 11/25/12  . ED EKG  . ED EKG  . EKG 12-LEAD  . EKG 12-LEAD  . EKG     Prior Assessment and Plan Problem List as of 12/26/2012   HYPOTHYROIDISM, PRIMARY   DIABETES MELLITUS, TYPE II, ON INSULIN   Last Assessment & Plan   09/01/2011 Clinical Support Written 09/01/2011 10:06 PM by Kathlen Brunswick, MD     Patient reports improvement in diabetic control with recent 40 pound weight loss.  She was congratulated on this achievement.    HYPERLIPIDEMIA-MIXED   OBESITY   Last Assessment & Plan   09/01/2011 Clinical Support Written 09/01/2011 10:08 PM by Kathlen Brunswick, MD     Patient reports a 40 pound weight loss.  No weight contained in conjunction with today's visit.    ANEMIA, IRON DEFICIENCY   Last Assessment & Plan   10/26/2012 Office Visit Written 10/27/2012 12:25 PM by Kathlen Brunswick, MD     Chronic mild to moderate anemia. Managed by Dr. Darrick Penna and patient's PCP.    HYPERTENSION   Last Assessment & Plan   10/26/2012 Office Visit Written 10/27/2012 12:31 PM by Kathlen Brunswick, MD     Blood pressure control generally good over the past few months; patient will monitor at home and return to her next visit with a list of determinations.    Atrial fibrillation   Last Assessment & Plan   10/26/2012 Office Visit Written 10/27/2012 12:27 PM by Kathlen Brunswick, MD     Heart rate is an adequately controlled after moderate dose diltiazem discontinued and low-dose metoprolol started. I doubt that diltiazem is causing impairment of mental acuity, but treatment with metoprolol should be equally efficacious. Dose will be increased to a total of 75 mg per day and heart rate  control reassessed in one week.    CONGESTIVE HEART FAILURE, MILD   Last Assessment & Plan   09/22/2011 Office Visit Written 09/22/2011  4:49 PM by Jodelle Gross, NP     Will check a BNP and CXR today. She has some scattered crackles and has gain 5 lbs since being seen last. Try to avoid admission if we can diurese as OP if necessary. Close follow-up.    HYPOTENSION, ORTHOSTATIC   Last Assessment & Plan   10/26/2012 Office Visit Written 10/27/2012 12:31 PM by Kathlen Brunswick, MD     No orthostatic change in blood pressure at today's visit.    LUNG NODULE   GERD   Last Assessment & Plan   04/04/2012 Office Visit Written 04/04/2012  9:51 AM by West Bali, MD     SX CONTROLLED WITH OMP.      Barrett's esophagus   Last Assessment & Plan   04/04/2012 Office Visit Written 04/04/2012  9:50 AM by West Bali, MD     IRREGULAR Z LINE ON EGD APR 2013. UNABLE TO APPRECIATE COLUMNAR EPITHELIUM EXTENDING ABOVE THE JXN.    GASTROPARESIS   Last Assessment & Plan   04/04/2012 Office  Visit Written 04/04/2012  9:51 AM by West Bali, MD     SX CONTROLLED WITH DIET.      Irritable bowel syndrome   Last Assessment & Plan   04/04/2012 Office Visit Written 04/04/2012  9:52 AM by West Bali, MD     SX FAIRLY WELL CONTROLLED.  TAKE IMODIUM MON WED FRI. OPV IN 6 MOS.    LOW BACK PAIN, CHRONIC   SLEEP APNEA   CHEST PAIN-PRECORDIAL   Last Assessment & Plan   09/01/2011 Clinical Support Written 09/03/2011  3:53 PM by Kathlen Brunswick, MD     No recent chest discomfort.    CARPAL TUNNEL SYNDROME, HX OF   Chronic anticoagulation   Last Assessment & Plan   10/26/2012 Office Visit Written 10/27/2012 12:30 PM by Kathlen Brunswick, MD     Mild anemia persists, likely do to chronic disease although previously characterized as iron deficiency. FOBT will be repeated.    Tremor   Last Assessment & Plan   10/26/2012 Office Visit Edited 10/27/2012  2:00 PM by Kathlen Brunswick, MD     Although no  tremor is present on examination this afternoon, she does have some masking of facial emotions and some difficulty in initiating and maintaining ambulation. At the request of family, I have referred her to [facility] for evaluation.    UTI (urinary tract infection)       Imaging: No results found.

## 2012-12-26 NOTE — Patient Instructions (Addendum)
Your physician recommends that you schedule a follow-up appointment in: 6 months with Dr Kirtland Bouchard  Your physician recommends that you return for lab work in: 3 months Your physician recommends that you have follow up lab work, we will mail you a reminder letter to alert you when to go [employer], located across the street from our office. BMET,CBC  Your physician has recommended you make the following change in your medication:   1) START TAKING FERROUS SULFATE ONE TABLET TWICE DAILY

## 2012-12-27 NOTE — Assessment & Plan Note (Signed)
Oral iron therapy started some months ago, but subsequently discontinued, apparently in error. This medication will be reordered. If hemoglobin does not recover towards normal, substitution of intravenous iron should be considered.

## 2013-01-07 ENCOUNTER — Ambulatory Visit (INDEPENDENT_AMBULATORY_CARE_PROVIDER_SITE_OTHER): Payer: Medicare Other | Admitting: *Deleted

## 2013-01-07 DIAGNOSIS — I4891 Unspecified atrial fibrillation: Secondary | ICD-10-CM

## 2013-01-21 ENCOUNTER — Ambulatory Visit (INDEPENDENT_AMBULATORY_CARE_PROVIDER_SITE_OTHER): Payer: Medicare Other | Admitting: *Deleted

## 2013-01-21 DIAGNOSIS — I4891 Unspecified atrial fibrillation: Secondary | ICD-10-CM

## 2013-01-21 LAB — POCT INR: INR: 2.4

## 2013-02-11 ENCOUNTER — Ambulatory Visit (INDEPENDENT_AMBULATORY_CARE_PROVIDER_SITE_OTHER): Payer: Medicare Other | Admitting: *Deleted

## 2013-02-11 DIAGNOSIS — I4891 Unspecified atrial fibrillation: Secondary | ICD-10-CM

## 2013-02-11 LAB — POCT INR: INR: 3.7

## 2013-02-28 ENCOUNTER — Other Ambulatory Visit: Payer: Self-pay | Admitting: *Deleted

## 2013-02-28 ENCOUNTER — Ambulatory Visit (INDEPENDENT_AMBULATORY_CARE_PROVIDER_SITE_OTHER): Payer: Medicare Other | Admitting: *Deleted

## 2013-02-28 DIAGNOSIS — I4891 Unspecified atrial fibrillation: Secondary | ICD-10-CM

## 2013-02-28 NOTE — Patient Instructions (Addendum)
Do not fall, no not cut yourself, if you continue to notice bright red blood in urine seek urgent care  Reviewed safety again today 03/01/2013

## 2013-03-01 LAB — PROTIME-INR
INR: 7.4 — AB (ref <=1.1)
INR: 7.4 — AB (ref <=1.1)

## 2013-03-06 ENCOUNTER — Ambulatory Visit (INDEPENDENT_AMBULATORY_CARE_PROVIDER_SITE_OTHER): Payer: Medicare Other | Admitting: *Deleted

## 2013-03-06 DIAGNOSIS — I4891 Unspecified atrial fibrillation: Secondary | ICD-10-CM

## 2013-03-06 LAB — POCT INR: INR: 1.7

## 2013-03-13 ENCOUNTER — Ambulatory Visit (INDEPENDENT_AMBULATORY_CARE_PROVIDER_SITE_OTHER): Payer: Medicare Other | Admitting: *Deleted

## 2013-03-13 DIAGNOSIS — I4891 Unspecified atrial fibrillation: Secondary | ICD-10-CM

## 2013-03-13 LAB — POCT INR: INR: 4.5

## 2013-03-20 ENCOUNTER — Ambulatory Visit (INDEPENDENT_AMBULATORY_CARE_PROVIDER_SITE_OTHER): Payer: Medicare Other | Admitting: *Deleted

## 2013-03-20 DIAGNOSIS — I4891 Unspecified atrial fibrillation: Secondary | ICD-10-CM

## 2013-03-25 ENCOUNTER — Ambulatory Visit (INDEPENDENT_AMBULATORY_CARE_PROVIDER_SITE_OTHER): Payer: Medicare Other | Admitting: *Deleted

## 2013-03-25 DIAGNOSIS — I4891 Unspecified atrial fibrillation: Secondary | ICD-10-CM

## 2013-03-27 ENCOUNTER — Encounter: Payer: Self-pay | Admitting: *Deleted

## 2013-03-27 ENCOUNTER — Other Ambulatory Visit: Payer: Self-pay | Admitting: *Deleted

## 2013-03-27 DIAGNOSIS — I10 Essential (primary) hypertension: Secondary | ICD-10-CM

## 2013-04-03 LAB — BASIC METABOLIC PANEL
BUN: 17 mg/dL (ref 6–23)
CO2: 20 mEq/L (ref 19–32)
Calcium: 9 mg/dL (ref 8.4–10.5)
Chloride: 107 mEq/L (ref 96–112)
Creat: 0.8 mg/dL (ref 0.50–1.10)
Glucose, Bld: 274 mg/dL — ABNORMAL HIGH (ref 70–99)

## 2013-04-03 LAB — CBC
MCH: 27.3 pg (ref 26.0–34.0)
MCHC: 32 g/dL (ref 30.0–36.0)
Platelets: 230 10*3/uL (ref 150–400)
RDW: 18.1 % — ABNORMAL HIGH (ref 11.5–15.5)

## 2013-04-04 ENCOUNTER — Encounter: Payer: Self-pay | Admitting: *Deleted

## 2013-04-08 ENCOUNTER — Ambulatory Visit (INDEPENDENT_AMBULATORY_CARE_PROVIDER_SITE_OTHER): Payer: Medicare Other | Admitting: *Deleted

## 2013-04-08 DIAGNOSIS — I4891 Unspecified atrial fibrillation: Secondary | ICD-10-CM

## 2013-04-08 LAB — POCT INR: INR: 5.3

## 2013-04-15 ENCOUNTER — Ambulatory Visit (INDEPENDENT_AMBULATORY_CARE_PROVIDER_SITE_OTHER): Payer: Medicare Other | Admitting: *Deleted

## 2013-04-15 DIAGNOSIS — I4891 Unspecified atrial fibrillation: Secondary | ICD-10-CM

## 2013-04-29 ENCOUNTER — Ambulatory Visit (INDEPENDENT_AMBULATORY_CARE_PROVIDER_SITE_OTHER): Payer: Medicare Other | Admitting: *Deleted

## 2013-04-29 DIAGNOSIS — I4891 Unspecified atrial fibrillation: Secondary | ICD-10-CM

## 2013-04-29 MED ORDER — WARFARIN SODIUM 2.5 MG PO TABS: ORAL_TABLET | ORAL | Status: DC

## 2013-05-13 ENCOUNTER — Ambulatory Visit (INDEPENDENT_AMBULATORY_CARE_PROVIDER_SITE_OTHER): Payer: Medicare Other | Admitting: *Deleted

## 2013-05-13 DIAGNOSIS — I4891 Unspecified atrial fibrillation: Secondary | ICD-10-CM

## 2013-05-28 ENCOUNTER — Other Ambulatory Visit: Payer: Self-pay | Admitting: Adult Health

## 2013-05-28 ENCOUNTER — Other Ambulatory Visit: Payer: Self-pay | Admitting: Cardiology

## 2013-06-03 ENCOUNTER — Ambulatory Visit (INDEPENDENT_AMBULATORY_CARE_PROVIDER_SITE_OTHER): Payer: Medicare Other | Admitting: *Deleted

## 2013-06-03 DIAGNOSIS — I4891 Unspecified atrial fibrillation: Secondary | ICD-10-CM

## 2013-06-15 ENCOUNTER — Emergency Department (HOSPITAL_COMMUNITY)
Admission: EM | Admit: 2013-06-15 | Discharge: 2013-06-15 | Disposition: A | Discharge status: Home / Self Care | Payer: Medicare Other | Attending: Emergency Medicine | Admitting: Emergency Medicine

## 2013-06-15 ENCOUNTER — Encounter (HOSPITAL_COMMUNITY): Payer: Self-pay | Admitting: Emergency Medicine

## 2013-06-15 ENCOUNTER — Emergency Department (HOSPITAL_COMMUNITY): Payer: Medicare Other

## 2013-06-15 DIAGNOSIS — Z79899 Other long term (current) drug therapy: Secondary | ICD-10-CM | POA: Insufficient documentation

## 2013-06-15 DIAGNOSIS — R000 Tachycardia, unspecified: Secondary | ICD-10-CM | POA: Insufficient documentation

## 2013-06-15 DIAGNOSIS — J40 Bronchitis, not specified as acute or chronic: Secondary | ICD-10-CM | POA: Insufficient documentation

## 2013-06-15 DIAGNOSIS — D649 Anemia, unspecified: Secondary | ICD-10-CM | POA: Insufficient documentation

## 2013-06-15 DIAGNOSIS — E039 Hypothyroidism, unspecified: Secondary | ICD-10-CM | POA: Insufficient documentation

## 2013-06-15 DIAGNOSIS — G8929 Other chronic pain: Secondary | ICD-10-CM | POA: Insufficient documentation

## 2013-06-15 DIAGNOSIS — E785 Hyperlipidemia, unspecified: Secondary | ICD-10-CM | POA: Insufficient documentation

## 2013-06-15 DIAGNOSIS — I509 Heart failure, unspecified: Secondary | ICD-10-CM | POA: Insufficient documentation

## 2013-06-15 DIAGNOSIS — I4891 Unspecified atrial fibrillation: Secondary | ICD-10-CM | POA: Insufficient documentation

## 2013-06-15 DIAGNOSIS — G4733 Obstructive sleep apnea (adult) (pediatric): Secondary | ICD-10-CM | POA: Insufficient documentation

## 2013-06-15 DIAGNOSIS — Z794 Long term (current) use of insulin: Secondary | ICD-10-CM | POA: Insufficient documentation

## 2013-06-15 DIAGNOSIS — E669 Obesity, unspecified: Secondary | ICD-10-CM | POA: Insufficient documentation

## 2013-06-15 DIAGNOSIS — Z87891 Personal history of nicotine dependence: Secondary | ICD-10-CM | POA: Insufficient documentation

## 2013-06-15 DIAGNOSIS — K219 Gastro-esophageal reflux disease without esophagitis: Secondary | ICD-10-CM | POA: Insufficient documentation

## 2013-06-15 DIAGNOSIS — Z7901 Long term (current) use of anticoagulants: Secondary | ICD-10-CM | POA: Insufficient documentation

## 2013-06-15 DIAGNOSIS — E119 Type 2 diabetes mellitus without complications: Secondary | ICD-10-CM | POA: Insufficient documentation

## 2013-06-15 DIAGNOSIS — I10 Essential (primary) hypertension: Secondary | ICD-10-CM | POA: Insufficient documentation

## 2013-06-15 LAB — CBC WITH DIFFERENTIAL/PLATELET
Basophils Absolute: 0 10*3/uL (ref 0.0–0.1)
Basophils Relative: 0 % (ref 0–1)
Eosinophils Absolute: 0.1 10*3/uL (ref 0.0–0.7)
Eosinophils Relative: 1 % (ref 0–5)
Hemoglobin: 10.6 g/dL — ABNORMAL LOW (ref 12.0–15.0)
Lymphs Abs: 1.9 10*3/uL (ref 0.7–4.0)
MCH: 28.1 pg (ref 26.0–34.0)
MCHC: 30.7 g/dL (ref 30.0–36.0)
MCV: 91.5 fL (ref 78.0–100.0)
Monocytes Absolute: 0.8 10*3/uL (ref 0.1–1.0)
Monocytes Relative: 11 % (ref 3–12)
Neutrophils Relative %: 63 % (ref 43–77)
Platelets: 272 10*3/uL (ref 150–400)
RBC: 3.77 MIL/uL — ABNORMAL LOW (ref 3.87–5.11)
RDW: 15.7 % — ABNORMAL HIGH (ref 11.5–15.5)

## 2013-06-15 LAB — PROTIME-INR
INR: 2.45 — ABNORMAL HIGH (ref 0.00–1.49)
Prothrombin Time: 25.8 seconds — ABNORMAL HIGH (ref 11.6–15.2)

## 2013-06-15 LAB — BASIC METABOLIC PANEL
BUN: 9 mg/dL (ref 6–23)
Calcium: 8.5 mg/dL (ref 8.4–10.5)
Chloride: 103 mEq/L (ref 96–112)
Creatinine, Ser: 0.71 mg/dL (ref 0.50–1.10)
GFR calc Af Amer: >90 mL/min (ref >90)
Sodium: 140 mEq/L (ref 135–145)

## 2013-06-15 LAB — PRO B NATRIURETIC PEPTIDE: Pro B Natriuretic peptide (BNP): 3732 pg/mL — ABNORMAL HIGH (ref 0–125)

## 2013-06-15 LAB — TROPONIN I: Troponin I: <0.3 ng/mL (ref <0.30)

## 2013-06-15 MED ORDER — BENZONATATE 100 MG PO CAPS: 100.0000 mg | ORAL_CAPSULE | Freq: Three times a day (TID) | ORAL | Status: DC

## 2013-06-15 NOTE — ED Notes (Signed)
Pt has been sick for the last week with a cough and cold, has been on a z-pack. Has been getting increasingly sob over last few days. Productive cough w/ yellow color.  No fever, chills or cp. No nausea or vomiting. Daughter checked her bp at home today and it was high, 156/102.  +nausea.

## 2013-06-15 NOTE — ED Provider Notes (Signed)
CSN: [id number]     Arrival date & time 06/15/13  1622 History  This chart was scribed for Tara B. Bernette Mayers, MD by Ronal Fear, ED Scribe. This patient was seen in room APA10/[facility]10 and the patient's care was started at 4:27 PM.    Chief Complaint  Patient presents with  . Shortness of Breath  . Hypertension   (Consider location/radiation/quality/duration/timing/severity/associated sxs/prior Treatment) The history is provided by the patient. No language interpreter was used.   HPI Comments: [name] is a 62 y.o. female with numerous medical problems who presents to the Emergency Department complaining of some difficulty breathing with associated congestion and loss of appetite for about 8 days.  Pt was sick for a few days prior to calling  her PCP 1 week ago.  She was started on a z-pac at that time and fininshed it 3x days ago without improvement. Continues to have nasal congestion  And productive cough with yellow sputum. Pt denies fever, diarrhea, vomiting or urinary symptoms but does feel nauseated at times. No fever. No orthopnea. She takes lasix PRN, has not taken a dose since late last week. Today she checked her SBP and it was 150s. Daughter became concerned and convinced her to come to the ED for eval.    Past Medical History  Diagnosis Date  . Chest pain     Negative cardiac catheterization in 2002; negative stress nuclear study in 2008  . Congestive heart failure     Normal LV systolic function  . PAF (paroxysmal atrial fibrillation) 2009  . Chronic anticoagulation   . Diabetes mellitus, type 2     Insulin therapy; exacerbated by prednisone  . Syncope     Admitted 05/2009; magnetic resonance imagin/ MRA - negative; etiology thought to be orthostasis secondary to drugs and dehydration  . GERD (gastroesophageal reflux disease)   . IBS (irritable bowel syndrome)   . Hiatal hernia   . Gastroparesis     99% retention 05/2008 on GES  . Hyperlipidemia   . Hypertension   .  Hypothyroid   . Chronic LBP     Surgical intervention in 1996  . Obesity   . OSA on CPAP   . Anemia     H/H of 10/30 with a normal MCV in 12/09  . Barrett's esophagus     Diagnosed 1995. Last EGD 2008.    Past Surgical History  Procedure Laterality Date  . Cardiovascular stress test  2008    Stress nuclear study  . Back surgery  1996  . Laparoscopic cholecystectomy  1990s  . Total abdominal hysterectomy  1999  . Laminectomy  1995    L4-L5  . Carpal tunnel release  1994  . Esophagogastroduodenoscopy  2008    Barrett's without dysplasia. esphagus dilated. antral erosions, h.pylori serologies negative.  . Colonoscopy  2013    Patient reports normal TCS around 10 years ago; and a repeat study in 2013 that was negative  . Givens capsule study  12/07/2011    Procedure: GIVENS CAPSULE STUDY;  Surgeon: West Bali, MD;  Location: [facility];  Service: Endoscopy;  Laterality: N/A;  . Cardiac catheterization  2002   Family History  Problem Relation Age of Onset  . Hypertension Mother   . Alzheimer's disease Mother   . Stroke Mother   . Heart attack Mother   . Heart disease Neg Hx   . Hypertension Other   . Colon cancer Neg Hx    History  Substance  Use Topics  . Smoking status: Former Games developer  . Smokeless tobacco: Former Neurosurgeon     Comment: Remote  . Alcohol Use: No     Comment: last etoh one year ago, never frequent   OB History   Grav Para Term Preterm Abortions TAB SAB Ect Mult Living                 Review of Systems 10 Systems reviewed and all are negative for acute change except as noted in the HPI.   Allergies  Codeine  Home Medications   Current Outpatient Rx  Name  Route  Sig  Dispense  Refill  . acetaminophen (TYLENOL) 500 MG tablet   Oral   Take 1,000 mg by mouth daily as needed. For pain         . ferrous sulfate 325 (65 FE) MG tablet   Oral   Take 1 tablet (325 mg total) by mouth daily with breakfast.   30 tablet   11   . furosemide (LASIX)  40 MG tablet   Oral   Take 40 mg by mouth daily as needed. Swelling         . glipiZIDE (GLUCOTROL) 10 MG tablet   Oral   Take 10 mg by mouth 2 (two) times daily.          . insulin glargine (LANTUS) 100 UNIT/ML injection   Subcutaneous   Inject 50 Units into the skin at bedtime. *ONLY takes when blood sugar levels exceed 200*         . levETIRAcetam (KEPPRA) 250 MG tablet   Oral   Take 1 tablet (250 mg total) by mouth 2 (two) times daily.   60 tablet   11   . levothyroxine (SYNTHROID, LEVOTHROID) 175 MCG tablet   Oral   Take 175 mcg by mouth every morning.         . loperamide (IMODIUM) 2 MG capsule   Oral   Take 2 mg by mouth 4 (four) times daily as needed. Diarrhea         . LORazepam (ATIVAN) 1 MG tablet   Oral   Take 1 mg by mouth 3 (three) times daily as needed. For anxiety         . metaxalone (SKELAXIN) 800 MG tablet   Oral   Take 800 mg by mouth daily.          . metFORMIN (GLUCOPHAGE) 500 MG tablet   Oral   Take 1,000 mg by mouth 2 (two) times daily with a meal.         . metoCLOPramide (REGLAN) 10 MG tablet   Oral   Take 10 mg by mouth 4 (four) times daily.          . metoprolol (LOPRESSOR) 100 MG tablet      TAKE ONE TABLET BY MOUTH TWICE DAILY.   60 tablet   0   . metoprolol (LOPRESSOR) 100 MG tablet      TAKE ONE TABLET BY MOUTH TWICE DAILY.   60 tablet   1   . nitroGLYCERIN (NITROSTAT) 0.4 MG SL tablet   Sublingual   Place 0.4 mg under the tongue as needed. For chest pain          . omeprazole (PRILOSEC) 20 MG capsule   Oral   Take 20 mg by mouth 2 (two) times daily.           . ondansetron (ZOFRAN ODT) 4 MG disintegrating tablet  Oral   Take 1 tablet (4 mg total) by mouth every 8 (eight) hours as needed for nausea.   10 tablet   0   . ondansetron (ZOFRAN) 8 MG tablet   Oral   Take 8 mg by mouth daily as needed. nausea         . potassium chloride SA (K-DUR,KLOR-CON) 20 MEQ tablet   Oral   Take 20 mEq  by mouth 3 (three) times daily.          . simvastatin (ZOCOR) 40 MG tablet   Oral   Take 40 mg by mouth at bedtime.          Marland Kitchen warfarin (COUMADIN) 2.5 MG tablet      Take 1/2 tablet daily except 1 tablet on Fridays   30 tablet   3    There were no vitals taken for this visit. Physical Exam  Nursing note and vitals reviewed. Constitutional: She is oriented to person, place, and time. She appears well-developed and well-nourished.  HENT:  Head: Normocephalic and atraumatic.  Eyes: EOM are normal. Pupils are equal, round, and reactive to light.  Neck: Normal range of motion. Neck supple.  Cardiovascular: Normal heart sounds and intact distal pulses.  An irregular rhythm present. Tachycardia present.   Pulmonary/Chest: Effort normal and breath sounds normal.  Abdominal: Bowel sounds are normal. She exhibits no distension. There is no tenderness.  Musculoskeletal: Normal range of motion. She exhibits edema (trace BLE). She exhibits no tenderness.  Neurological: She is alert and oriented to person, place, and time. She has normal strength. No cranial nerve deficit or sensory deficit.  Skin: Skin is warm and dry. No rash noted.  Psychiatric: She has a normal mood and affect.    ED Course  Procedures (including critical care time) DIAGNOSTIC STUDIES:   COORDINATION OF CARE:  4:36 PM- Pt advised of plan for treatment including X-ray and labs and pt agrees.  Labs Review Labs Reviewed  CBC WITH DIFFERENTIAL - Abnormal; Notable for the following:    RBC 3.77 (*)    Hemoglobin 10.6 (*)    HCT 34.5 (*)    RDW 15.7 (*)    All other components within normal limits  BASIC METABOLIC PANEL - Abnormal; Notable for the following:    Potassium 3.3 (*)    Glucose, Bld 351 (*)    GFR calc non Af Amer 89 (*)    All other components within normal limits  PRO B NATRIURETIC PEPTIDE - Abnormal; Notable for the following:    Pro B Natriuretic peptide (BNP) 3732.0 (*)    All other  components within normal limits  PROTIME-INR - Abnormal; Notable for the following:    Prothrombin Time 25.8 (*)    INR 2.45 (*)    All other components within normal limits  TROPONIN I  URINALYSIS, ROUTINE W REFLEX MICROSCOPIC   Imaging Review Dg Chest 2 View  06/15/2013   CLINICAL DATA:  Cough, shortness of breath.  EXAM: CHEST  2 VIEW  COMPARISON:  For 07/23/2012.  FINDINGS: The cardiac silhouette is enlarged. No focal regions of consolidation. No focal infiltrates are appreciated. The lateral view is degraded by motion or possibly grid artifact. There is flattening of the hemidiaphragms. With thickening taking into consideration no focal regions of consolidation or focal infiltrates appreciated. The osseous structures are grossly unremarkable.  IMPRESSION: Study limited by artifact on lateral view. Cardiomegaly. With technique taking into consideration there is no evidence of focal infiltrates no  focal regions of consolidation. COPD. Repeat of the lateral view is recommended.   Electronically Signed   By: Salome Holmes M.D.   On: 06/15/2013 17:37    EKG Interpretation    Date/Time:  Saturday June 15 2013 18:03:59 EST Ventricular Rate:  110 PR Interval:    QRS Duration: 98 QT Interval:  394 QTC Calculation: 533 R Axis:   -49 Text Interpretation:  Atrial fibrillation with rapid ventricular response Prolonged QT Abnormal ECG When compared with ECG of 25-Nov-2012 17:12, No significant change since last tracing Confirmed by SHELDON  MD, Tara ([id number]) on 06/15/2013 6:21:23 PM            MDM   1. Bronchitis     Pt is mildly tachycardic, irregular, history of afib. Doubt this is CHF exacerbation but will check labs. CXR to rule out PNA not adequately treated with z-pak.   6:43 PM CXR neg for PNA or edema. Doubt this is CHF, suspect a viral bronchitis. Advised that additional Abx would not be helpful. She is still in therapeutic time frame for Z-pak, regardless. Will d/c  with Tessalon for cough. Advised close PCP followup.  I personally performed the services described in this documentation, which was scribed in my presence. The recorded information has been reviewed and is accurate.      Tara B. Bernette Mayers, MD 06/15/13 [id number]

## 2013-06-16 ENCOUNTER — Encounter (HOSPITAL_COMMUNITY): Payer: Self-pay | Admitting: Emergency Medicine

## 2013-06-16 ENCOUNTER — Emergency Department (HOSPITAL_COMMUNITY)
Admission: EM | Admit: 2013-06-16 | Discharge: 2013-06-16 | Disposition: A | Discharge status: Home / Self Care | Payer: Medicare Other | Attending: Emergency Medicine | Admitting: Emergency Medicine

## 2013-06-16 DIAGNOSIS — Z87891 Personal history of nicotine dependence: Secondary | ICD-10-CM | POA: Insufficient documentation

## 2013-06-16 DIAGNOSIS — K219 Gastro-esophageal reflux disease without esophagitis: Secondary | ICD-10-CM | POA: Insufficient documentation

## 2013-06-16 DIAGNOSIS — I10 Essential (primary) hypertension: Secondary | ICD-10-CM | POA: Insufficient documentation

## 2013-06-16 DIAGNOSIS — G8929 Other chronic pain: Secondary | ICD-10-CM | POA: Insufficient documentation

## 2013-06-16 DIAGNOSIS — K589 Irritable bowel syndrome without diarrhea: Secondary | ICD-10-CM | POA: Insufficient documentation

## 2013-06-16 DIAGNOSIS — E785 Hyperlipidemia, unspecified: Secondary | ICD-10-CM | POA: Insufficient documentation

## 2013-06-16 DIAGNOSIS — I509 Heart failure, unspecified: Secondary | ICD-10-CM | POA: Insufficient documentation

## 2013-06-16 DIAGNOSIS — R040 Epistaxis: Secondary | ICD-10-CM

## 2013-06-16 DIAGNOSIS — M545 Low back pain, unspecified: Secondary | ICD-10-CM | POA: Insufficient documentation

## 2013-06-16 DIAGNOSIS — E119 Type 2 diabetes mellitus without complications: Secondary | ICD-10-CM | POA: Insufficient documentation

## 2013-06-16 DIAGNOSIS — Z8701 Personal history of pneumonia (recurrent): Secondary | ICD-10-CM | POA: Insufficient documentation

## 2013-06-16 DIAGNOSIS — G4733 Obstructive sleep apnea (adult) (pediatric): Secondary | ICD-10-CM | POA: Insufficient documentation

## 2013-06-16 DIAGNOSIS — I4891 Unspecified atrial fibrillation: Secondary | ICD-10-CM | POA: Insufficient documentation

## 2013-06-16 DIAGNOSIS — E039 Hypothyroidism, unspecified: Secondary | ICD-10-CM | POA: Insufficient documentation

## 2013-06-16 DIAGNOSIS — Z9981 Dependence on supplemental oxygen: Secondary | ICD-10-CM | POA: Insufficient documentation

## 2013-06-16 DIAGNOSIS — Z79899 Other long term (current) drug therapy: Secondary | ICD-10-CM | POA: Insufficient documentation

## 2013-06-16 DIAGNOSIS — D649 Anemia, unspecified: Secondary | ICD-10-CM | POA: Insufficient documentation

## 2013-06-16 DIAGNOSIS — E669 Obesity, unspecified: Secondary | ICD-10-CM | POA: Insufficient documentation

## 2013-06-16 DIAGNOSIS — K3184 Gastroparesis: Secondary | ICD-10-CM | POA: Insufficient documentation

## 2013-06-16 DIAGNOSIS — Z794 Long term (current) use of insulin: Secondary | ICD-10-CM | POA: Insufficient documentation

## 2013-06-16 NOTE — ED Notes (Signed)
Patient complaining of nosebleed that started at approximately 2130 tonight to left nare. Patient takes coumadin. Reports nosebleed on Friday as well. No active bleeding at this time.

## 2013-06-16 NOTE — ED Notes (Signed)
Patient with no complaints at this time. Respirations even and unlabored. Skin warm/dry. Discharge instructions reviewed with patient at this time. Patient given opportunity to voice concerns/ask questions. Patient discharged at this time and left Emergency Department with steady gait.   

## 2013-06-16 NOTE — ED Provider Notes (Signed)
CSN: [id number]     Arrival date & time 06/16/13  2208 History   First MD Initiated Contact with Patient 06/16/13 2302     This chart was scribed for Lyanne Co, MD by Arlan Organ, ED Scribe. This patient was seen in room APA01/APA01 and the patient's care was started 11:05 PM.   Chief Complaint  Patient presents with  . Epistaxis    The history is provided by the patient. No language interpreter was used.    HPI Comments: [name] is a 62 y.o. Female with h/o of CP, CHF, PAF, DM,  HTN, Chronic Anticoagulation who presents to the Emergency Department complaining of gradual onset epistaxis to the left nare that initially started around 9:30 this evening. Pt is currently on coumadin, and reports her INR was 2.5 yesterday. She reports a previous epistaxis episode on 12/12 which was successfully stopped. She says typically when she has nosebleed, she "holds her head back and applies pressure". She denies any other symptoms.  Followed by [facility] Past Medical History  Diagnosis Date  . Chest pain     Negative cardiac catheterization in 2002; negative stress nuclear study in 2008  . Congestive heart failure     Normal LV systolic function  . PAF (paroxysmal atrial fibrillation) 2009  . Chronic anticoagulation   . Diabetes mellitus, type 2     Insulin therapy; exacerbated by prednisone  . Syncope     Admitted 05/2009; magnetic resonance imagin/ MRA - negative; etiology thought to be orthostasis secondary to drugs and dehydration  . GERD (gastroesophageal reflux disease)   . IBS (irritable bowel syndrome)   . Hiatal hernia   . Gastroparesis     99% retention 05/2008 on GES  . Hyperlipidemia   . Hypertension   . Hypothyroid   . Chronic LBP     Surgical intervention in 1996  . Obesity   . OSA on CPAP   . Anemia     H/H of 10/30 with a normal MCV in 12/09  . Barrett's esophagus     Diagnosed 1995. Last EGD 2008.    Past Surgical History  Procedure Laterality Date   . Cardiovascular stress test  2008    Stress nuclear study  . Back surgery  1996  . Laparoscopic cholecystectomy  1990s  . Total abdominal hysterectomy  1999  . Laminectomy  1995    L4-L5  . Carpal tunnel release  1994  . Esophagogastroduodenoscopy  2008    Barrett's without dysplasia. esphagus dilated. antral erosions, h.pylori serologies negative.  . Colonoscopy  2013    Patient reports normal TCS around 10 years ago; and a repeat study in 2013 that was negative  . Givens capsule study  12/07/2011    Procedure: GIVENS CAPSULE STUDY;  Surgeon: West Bali, MD;  Location: [facility];  Service: Endoscopy;  Laterality: N/A;  . Cardiac catheterization  2002   Family History  Problem Relation Age of Onset  . Hypertension Mother   . Alzheimer's disease Mother   . Stroke Mother   . Heart attack Mother   . Heart disease Neg Hx   . Hypertension Other   . Colon cancer Neg Hx    History  Substance Use Topics  . Smoking status: Former Games developer  . Smokeless tobacco: Former Neurosurgeon     Comment: Remote  . Alcohol Use: No     Comment: last etoh one year ago, never frequent   OB History  Grav Para Term Preterm Abortions TAB SAB Ect Mult Living                 Review of Systems  Constitutional: Negative for fever and chills.  HENT:       Epistaxis to left nare  All other systems reviewed and are negative.    Allergies  Codeine  Home Medications   Current Outpatient Rx  Name  Route  Sig  Dispense  Refill  . acetaminophen (TYLENOL ARTHRITIS PAIN) 650 MG CR tablet   Oral   Take 650 mg by mouth every 8 (eight) hours as needed for pain.         . benzonatate (TESSALON) 100 MG capsule   Oral   Take 1 capsule (100 mg total) by mouth every 8 (eight) hours.   21 capsule   0   . ferrous sulfate 325 (65 FE) MG tablet   Oral   Take 1 tablet (325 mg total) by mouth daily with breakfast.   30 tablet   11   . furosemide (LASIX) 40 MG tablet   Oral   Take 40 mg by mouth  daily as needed. Swelling         . glipiZIDE (GLUCOTROL) 10 MG tablet   Oral   Take 10 mg by mouth 2 (two) times daily.          . insulin glargine (LANTUS) 100 UNIT/ML injection   Subcutaneous   Inject 50 Units into the skin at bedtime. *ONLY takes when blood sugar levels exceed 200*         . levETIRAcetam (KEPPRA) 250 MG tablet   Oral   Take 1 tablet (250 mg total) by mouth 2 (two) times daily.   60 tablet   11   . levothyroxine (SYNTHROID, LEVOTHROID) 137 MCG tablet   Oral   Take 137 mcg by mouth daily before breakfast.         . loperamide (ANTI-DIARRHEAL) 2 MG capsule   Oral   Take 2 mg by mouth every other day. Takes as needed for diarrhea         . LORazepam (ATIVAN) 1 MG tablet   Oral   Take 1 mg by mouth 3 (three) times daily as needed. For anxiety         . metaxalone (SKELAXIN) 800 MG tablet   Oral   Take 800 mg by mouth daily.          . metFORMIN (GLUCOPHAGE) 500 MG tablet   Oral   Take 1,000 mg by mouth 2 (two) times daily with a meal.         . metoCLOPramide (REGLAN) 10 MG tablet   Oral   Take 10 mg by mouth 4 (four) times daily.          . metoprolol (LOPRESSOR) 100 MG tablet   Oral   Take 100 mg by mouth 2 (two) times daily.         . nitroGLYCERIN (NITROSTAT) 0.4 MG SL tablet   Sublingual   Place 0.4 mg under the tongue as needed. For chest pain          . omeprazole (PRILOSEC) 20 MG capsule   Oral   Take 20 mg by mouth 2 (two) times daily.           . ondansetron (ZOFRAN) 8 MG tablet   Oral   Take 8 mg by mouth daily as needed. nausea         .  potassium chloride SA (K-DUR,KLOR-CON) 20 MEQ tablet   Oral   Take 20 mEq by mouth 3 (three) times daily.          . simvastatin (ZOCOR) 40 MG tablet   Oral   Take 40 mg by mouth at bedtime.          Marland Kitchen warfarin (COUMADIN) 2.5 MG tablet   Oral   Take 1.25 mg by mouth every evening.          Triage Vitals: BP 138/86  Pulse 88  Temp(Src) 98.4 F (36.9 C)  (Oral)  Resp 24  Ht 5\' 5"  (1.651 m)  Wt 230 lb (104.327 kg)  BMI 38.27 kg/m2  SpO2 97%  Physical Exam  Nursing note and vitals reviewed. Constitutional: She is oriented to person, place, and time. She appears well-developed and well-nourished. No distress.  HENT:  Head: Normocephalic and atraumatic.  NOSE: Left nare with stigmata of recent bleed Clot present No active bleeding at this time  THROAT: No oral pharyngeal blood   Eyes: EOM are normal.  Neck: Normal range of motion.  Cardiovascular: Normal rate, regular rhythm and normal heart sounds.   Pulmonary/Chest: Effort normal and breath sounds normal.  Abdominal: Soft. She exhibits no distension. There is no tenderness.  Musculoskeletal: Normal range of motion.  Neurological: She is alert and oriented to person, place, and time.  Skin: Skin is warm and dry.  Psychiatric: She has a normal mood and affect. Judgment normal.    ED Course  Procedures (including critical care time)  DIAGNOSTIC STUDIES: Oxygen Saturation is 97% on RA, Normal by my interpretation.    COORDINATION OF CARE: 11:13 PM-Discussed treatment plan with pt at bedside and pt agreed to plan.     Labs Review Labs Reviewed - No data to display Imaging Review   EKG Interpretation   None       MDM   1. Epistaxis    Bleeding controlled. No need to recheck INR today. Return instructions given. ENT follow up  I personally performed the services described in this documentation, which was scribed in my presence. The recorded information has been reviewed and is accurate.      Lyanne Co, MD 06/16/13 [phone]

## 2013-06-18 ENCOUNTER — Emergency Department (HOSPITAL_COMMUNITY)
Admission: EM | Admit: 2013-06-18 | Discharge: 2013-06-18 | Disposition: A | Discharge status: Home / Self Care | Payer: Medicare Other | Attending: Emergency Medicine | Admitting: Emergency Medicine

## 2013-06-18 ENCOUNTER — Encounter (HOSPITAL_COMMUNITY): Payer: Self-pay | Admitting: Emergency Medicine

## 2013-06-18 DIAGNOSIS — G4733 Obstructive sleep apnea (adult) (pediatric): Secondary | ICD-10-CM | POA: Insufficient documentation

## 2013-06-18 DIAGNOSIS — E785 Hyperlipidemia, unspecified: Secondary | ICD-10-CM | POA: Insufficient documentation

## 2013-06-18 DIAGNOSIS — E669 Obesity, unspecified: Secondary | ICD-10-CM | POA: Insufficient documentation

## 2013-06-18 DIAGNOSIS — R040 Epistaxis: Secondary | ICD-10-CM | POA: Insufficient documentation

## 2013-06-18 DIAGNOSIS — G8929 Other chronic pain: Secondary | ICD-10-CM | POA: Insufficient documentation

## 2013-06-18 DIAGNOSIS — E119 Type 2 diabetes mellitus without complications: Secondary | ICD-10-CM | POA: Insufficient documentation

## 2013-06-18 DIAGNOSIS — Z794 Long term (current) use of insulin: Secondary | ICD-10-CM | POA: Insufficient documentation

## 2013-06-18 DIAGNOSIS — D649 Anemia, unspecified: Secondary | ICD-10-CM | POA: Insufficient documentation

## 2013-06-18 DIAGNOSIS — Z95818 Presence of other cardiac implants and grafts: Secondary | ICD-10-CM | POA: Insufficient documentation

## 2013-06-18 DIAGNOSIS — I509 Heart failure, unspecified: Secondary | ICD-10-CM | POA: Insufficient documentation

## 2013-06-18 DIAGNOSIS — E039 Hypothyroidism, unspecified: Secondary | ICD-10-CM | POA: Insufficient documentation

## 2013-06-18 DIAGNOSIS — I4891 Unspecified atrial fibrillation: Secondary | ICD-10-CM | POA: Insufficient documentation

## 2013-06-18 DIAGNOSIS — K2270 Barrett's esophagus without dysplasia: Secondary | ICD-10-CM | POA: Insufficient documentation

## 2013-06-18 DIAGNOSIS — Z7901 Long term (current) use of anticoagulants: Secondary | ICD-10-CM | POA: Insufficient documentation

## 2013-06-18 DIAGNOSIS — Z87891 Personal history of nicotine dependence: Secondary | ICD-10-CM | POA: Insufficient documentation

## 2013-06-18 DIAGNOSIS — Z79899 Other long term (current) drug therapy: Secondary | ICD-10-CM | POA: Insufficient documentation

## 2013-06-18 DIAGNOSIS — K219 Gastro-esophageal reflux disease without esophagitis: Secondary | ICD-10-CM | POA: Insufficient documentation

## 2013-06-18 DIAGNOSIS — I10 Essential (primary) hypertension: Secondary | ICD-10-CM | POA: Insufficient documentation

## 2013-06-18 LAB — CBC
HCT: 34.1 % — ABNORMAL LOW (ref 36.0–46.0)
Hemoglobin: 10.6 g/dL — ABNORMAL LOW (ref 12.0–15.0)
MCH: 28.3 pg (ref 26.0–34.0)
MCHC: 31.1 g/dL (ref 30.0–36.0)
MCV: 90.9 fL (ref 78.0–100.0)
RDW: 15.6 % — ABNORMAL HIGH (ref 11.5–15.5)
WBC: 7 10*3/uL (ref 4.0–10.5)

## 2013-06-18 LAB — PROTIME-INR
INR: 3.67 — ABNORMAL HIGH (ref 0.00–1.49)
Prothrombin Time: 35.1 seconds — ABNORMAL HIGH (ref 11.6–15.2)

## 2013-06-18 MED ORDER — SALINE SPRAY 0.65 % NA SOLN
NASAL | Status: AC
  Filled 2013-06-18: qty 44

## 2013-06-18 MED ORDER — SALINE SPRAY 0.65 % NA SOLN
1.0000 | NASAL | Status: DC | PRN
  Filled 2013-06-18: qty 44

## 2013-06-18 MED ORDER — COCAINE HCL 4 % EX SOLN
CUTANEOUS | Status: AC
  Administered 2013-06-18: 19:00:00
  Filled 2013-06-18: qty 4

## 2013-06-18 NOTE — ED Notes (Signed)
Direct pressure has been applied for approx 30 min.  Bleeding has slowed down.

## 2013-06-18 NOTE — ED Provider Notes (Signed)
CSN: [id number]     Arrival date & time 06/18/13  1743 History  This chart was scribed for Gerhard Munch, MD by Luisa Dago, ED Scribe and Bennett Scrape, ED Scribe. This patient was seen in room APA03/APA03 and the patient's care was started at 6:15 PM.    Chief Complaint  Patient presents with  . Epistaxis    The history is provided by the patient. No language interpreter was used.   HPI Comments: [name] is a 62 y.o. female who is currently on coumadin for A. Fib presents to the Emergency Department complaining of a third episode of epistaxis in the past 3 days. Pt reports that she's been to the ED twice this week with similar symptoms. This current episode started at 4:30 PM today and is coming from the left nare. Pt states that she tried an ice pack, holding pressure and going outside but has not had any relief of symptoms. She states that this is a new symptoms for her; she denies any prior episodes prior to the past 3 days. Pt denies syncopy or CP.   PCP is Chief Executive Officer.  Past Medical History  Diagnosis Date  . Chest pain     Negative cardiac catheterization in 2002; negative stress nuclear study in 2008  . Congestive heart failure     Normal LV systolic function  . PAF (paroxysmal atrial fibrillation) 2009  . Chronic anticoagulation   . Diabetes mellitus, type 2     Insulin therapy; exacerbated by prednisone  . Syncope     Admitted 05/2009; magnetic resonance imagin/ MRA - negative; etiology thought to be orthostasis secondary to drugs and dehydration  . GERD (gastroesophageal reflux disease)   . IBS (irritable bowel syndrome)   . Hiatal hernia   . Gastroparesis     99% retention 05/2008 on GES  . Hyperlipidemia   . Hypertension   . Hypothyroid   . Chronic LBP     Surgical intervention in 1996  . Obesity   . OSA on CPAP   . Anemia     H/H of 10/30 with a normal MCV in 12/09  . Barrett's esophagus     Diagnosed 1995. Last EGD 2008.    Past Surgical History   Procedure Laterality Date  . Cardiovascular stress test  2008    Stress nuclear study  . Back surgery  1996  . Laparoscopic cholecystectomy  1990s  . Total abdominal hysterectomy  1999  . Laminectomy  1995    L4-L5  . Carpal tunnel release  1994  . Esophagogastroduodenoscopy  2008    Barrett's without dysplasia. esphagus dilated. antral erosions, h.pylori serologies negative.  . Colonoscopy  2013    Patient reports normal TCS around 10 years ago; and a repeat study in 2013 that was negative  . Givens capsule study  12/07/2011    Procedure: GIVENS CAPSULE STUDY;  Surgeon: West Bali, MD;  Location: [facility];  Service: Endoscopy;  Laterality: N/A;  . Cardiac catheterization  2002   Family History  Problem Relation Age of Onset  . Hypertension Mother   . Alzheimer's disease Mother   . Stroke Mother   . Heart attack Mother   . Heart disease Neg Hx   . Hypertension Other   . Colon cancer Neg Hx    History  Substance Use Topics  . Smoking status: Former Games developer  . Smokeless tobacco: Former Neurosurgeon     Comment: Remote  . Alcohol Use: No  Comment: last etoh one year ago, never frequent   OB History   Grav Para Term Preterm Abortions TAB SAB Ect Mult Living                 Review of Systems  Constitutional:       Per HPI, otherwise negative  HENT: Positive for nosebleeds.        Per HPI, otherwise negative  Respiratory:       Per HPI, otherwise negative  Cardiovascular:       Per HPI, otherwise negative  Gastrointestinal: Negative for vomiting.  Endocrine:       Negative aside from HPI  Genitourinary:       Neg aside from HPI   Musculoskeletal:       Per HPI, otherwise negative  Skin: Negative.   Neurological: Negative for syncope.    Allergies  Codeine  Home Medications   Current Outpatient Rx  Name  Route  Sig  Dispense  Refill  . acetaminophen (TYLENOL ARTHRITIS PAIN) 650 MG CR tablet   Oral   Take 650 mg by mouth every 8 (eight) hours as needed  for pain.         . benzonatate (TESSALON) 100 MG capsule   Oral   Take 1 capsule (100 mg total) by mouth every 8 (eight) hours.   21 capsule   0   . ferrous sulfate 325 (65 FE) MG tablet   Oral   Take 1 tablet (325 mg total) by mouth daily with breakfast.   30 tablet   11   . furosemide (LASIX) 40 MG tablet   Oral   Take 40 mg by mouth daily as needed. Swelling         . glipiZIDE (GLUCOTROL) 10 MG tablet   Oral   Take 10 mg by mouth 2 (two) times daily.          . insulin glargine (LANTUS) 100 UNIT/ML injection   Subcutaneous   Inject 50 Units into the skin at bedtime. *ONLY takes when blood sugar levels exceed 200*         . levETIRAcetam (KEPPRA) 250 MG tablet   Oral   Take 1 tablet (250 mg total) by mouth 2 (two) times daily.   60 tablet   11   . levothyroxine (SYNTHROID, LEVOTHROID) 137 MCG tablet   Oral   Take 137 mcg by mouth daily before breakfast.         . loperamide (ANTI-DIARRHEAL) 2 MG capsule   Oral   Take 2 mg by mouth every other day. Takes as needed for diarrhea         . LORazepam (ATIVAN) 1 MG tablet   Oral   Take 1 mg by mouth 3 (three) times daily as needed. For anxiety         . metaxalone (SKELAXIN) 800 MG tablet   Oral   Take 800 mg by mouth daily.          . metFORMIN (GLUCOPHAGE) 500 MG tablet   Oral   Take 1,000 mg by mouth 2 (two) times daily with a meal.         . metoCLOPramide (REGLAN) 10 MG tablet   Oral   Take 10 mg by mouth 4 (four) times daily.          . metoprolol (LOPRESSOR) 100 MG tablet   Oral   Take 100 mg by mouth 2 (two) times daily.         Marland Kitchen  nitroGLYCERIN (NITROSTAT) 0.4 MG SL tablet   Sublingual   Place 0.4 mg under the tongue as needed. For chest pain          . omeprazole (PRILOSEC) 20 MG capsule   Oral   Take 20 mg by mouth 2 (two) times daily.           . ondansetron (ZOFRAN) 8 MG tablet   Oral   Take 8 mg by mouth daily as needed. nausea         . potassium chloride  SA (K-DUR,KLOR-CON) 20 MEQ tablet   Oral   Take 20 mEq by mouth 3 (three) times daily.          . simvastatin (ZOCOR) 40 MG tablet   Oral   Take 40 mg by mouth at bedtime.          Marland Kitchen warfarin (COUMADIN) 2.5 MG tablet   Oral   Take 1.25 mg by mouth every evening.          Triage vitals:BP 148/90  Pulse 116  Temp(Src) 98.2 F (36.8 C)  Resp 20  Ht 5\' 8"  (1.727 m)  Wt 225 lb (102.059 kg)  BMI 34.22 kg/m2  SpO2 97%  Physical Exam  Nursing note and vitals reviewed. Constitutional: She is oriented to person, place, and time. She appears well-developed and well-nourished. No distress.  HENT:  Head: Normocephalic and atraumatic.  Active bleeding from the left nare.  Eyes: Conjunctivae and EOM are normal.  Cardiovascular: Normal rate and regular rhythm.   Pulmonary/Chest: Effort normal and breath sounds normal. No stridor. No respiratory distress.  Abdominal: She exhibits no distension.  Musculoskeletal: She exhibits no edema.  Neurological: She is alert and oriented to person, place, and time. No cranial nerve deficit.  Skin: Skin is warm and dry.  Psychiatric: She has a normal mood and affect.    ED Course  EPISTAXIS MANAGEMENT Date/Time: 06/18/2013 6:00 PM Performed by: Gerhard Munch Authorized by: Gerhard Munch Consent: Verbal consent obtained. Risks and benefits: risks, benefits and alternatives were discussed Consent given by: patient Patient understanding: patient states understanding of the procedure being performed Patient consent: the patient's understanding of the procedure matches consent given Procedure consent: procedure consent matches procedure scheduled Relevant documents: relevant documents present and verified Test results: test results available and properly labeled Site marked: the operative site was marked Imaging studies: imaging studies available Required items: required blood products, implants, devices, and special equipment  available Patient identity confirmed: verbally with patient Time out: Immediately prior to procedure a "time out" was called to verify the correct patient, procedure, equipment, support staff and site/side marked as required. Preparation: Patient was prepped and draped in the usual sterile fashion. Anesthesia: local infiltration Local anesthetic: cocaine 4% Patient sedated: no Recurrence: recurrence of recent bleed Patient tolerance: Patient tolerated the procedure well with no immediate complications.   (including critical care time)  DIAGNOSTIC STUDIES: Oxygen Saturation is 97% on room air, adequate by my interpretation.    COORDINATION OF CARE: 6:20 PM-Discussed treatment plan which includes cocaine with pt at bedside and pt agreed to plan.   6:45 PM- Pt rechecked and is bleeding through the cocaine packing. Advised pt that will we have to use a nasal rocket and pt agreed.   6:58 PM- Pt rechecked will try to alleviate symptoms within the left nare. Discussed with pt her elevated levels of Coumadin.    Labs Review Labs Reviewed  CBC  PROTIME-INR   Imaging Review No  results found.  EKG Interpretation   None       MDM  No diagnosis found.  I personally performed the services described in this documentation, which was scribed in my presence. The recorded information has been reviewed and is accurate.   Patient presents with recurrence of a recently active left nostril bleed.  Over, patient is on Coumadin, had mildly elevated level tonight.  Patient was counseled on the need to hold one dose of this medication today, have her INR checked again soon. Patient's bleeding was controlled with irrigation, packing of cocaine infused gauze. Patient with subsequent monitor, for recurrence, which did not happen. Return precautions, home instructions provided to her and multiple family members.   Gerhard Munch, MD 06/18/13 2003

## 2013-06-18 NOTE — ED Notes (Addendum)
Pt c/o nose bleed since 1630 today. Pt seen in ED twice this week for same. Denies injury/ha/dizziness. Pt on coumadin.

## 2013-06-18 NOTE — ED Notes (Signed)
Patient given discharge instruction, verbalized understand. Patient ambulatory out of the department.  

## 2013-06-24 ENCOUNTER — Ambulatory Visit (INDEPENDENT_AMBULATORY_CARE_PROVIDER_SITE_OTHER): Payer: Medicare Other | Admitting: Pharmacist

## 2013-06-24 DIAGNOSIS — I4891 Unspecified atrial fibrillation: Secondary | ICD-10-CM

## 2013-06-24 LAB — POCT INR: INR: 6.6

## 2013-06-26 ENCOUNTER — Ambulatory Visit: Payer: Medicare Other | Admitting: Cardiovascular Disease

## 2013-06-29 ENCOUNTER — Encounter (HOSPITAL_COMMUNITY): Payer: Self-pay | Admitting: Emergency Medicine

## 2013-06-29 ENCOUNTER — Inpatient Hospital Stay (HOSPITAL_COMMUNITY)
Admission: EM | Admit: 2013-06-29 | Discharge: 2013-07-04 | DRG: 195 | Disposition: A | Discharge status: Home / Self Care | Payer: Medicare Other | Attending: Family Medicine | Admitting: Family Medicine

## 2013-06-29 ENCOUNTER — Emergency Department (HOSPITAL_COMMUNITY): Payer: Medicare Other

## 2013-06-29 DIAGNOSIS — K3184 Gastroparesis: Secondary | ICD-10-CM | POA: Diagnosis present

## 2013-06-29 DIAGNOSIS — E119 Type 2 diabetes mellitus without complications: Secondary | ICD-10-CM

## 2013-06-29 DIAGNOSIS — D509 Iron deficiency anemia, unspecified: Secondary | ICD-10-CM | POA: Diagnosis present

## 2013-06-29 DIAGNOSIS — E039 Hypothyroidism, unspecified: Secondary | ICD-10-CM | POA: Diagnosis present

## 2013-06-29 DIAGNOSIS — I4891 Unspecified atrial fibrillation: Secondary | ICD-10-CM | POA: Diagnosis present

## 2013-06-29 DIAGNOSIS — K589 Irritable bowel syndrome without diarrhea: Secondary | ICD-10-CM | POA: Diagnosis present

## 2013-06-29 DIAGNOSIS — E210 Primary hyperparathyroidism: Secondary | ICD-10-CM | POA: Diagnosis present

## 2013-06-29 DIAGNOSIS — G8929 Other chronic pain: Secondary | ICD-10-CM | POA: Diagnosis present

## 2013-06-29 DIAGNOSIS — E785 Hyperlipidemia, unspecified: Secondary | ICD-10-CM | POA: Diagnosis present

## 2013-06-29 DIAGNOSIS — R251 Tremor, unspecified: Secondary | ICD-10-CM | POA: Diagnosis present

## 2013-06-29 DIAGNOSIS — K219 Gastro-esophageal reflux disease without esophagitis: Secondary | ICD-10-CM | POA: Diagnosis present

## 2013-06-29 DIAGNOSIS — J189 Pneumonia, unspecified organism: Principal | ICD-10-CM | POA: Diagnosis present

## 2013-06-29 DIAGNOSIS — Z794 Long term (current) use of insulin: Secondary | ICD-10-CM

## 2013-06-29 DIAGNOSIS — M545 Low back pain, unspecified: Secondary | ICD-10-CM | POA: Diagnosis present

## 2013-06-29 DIAGNOSIS — R0902 Hypoxemia: Secondary | ICD-10-CM | POA: Diagnosis present

## 2013-06-29 DIAGNOSIS — R0609 Other forms of dyspnea: Secondary | ICD-10-CM | POA: Diagnosis present

## 2013-06-29 DIAGNOSIS — E876 Hypokalemia: Secondary | ICD-10-CM | POA: Diagnosis present

## 2013-06-29 DIAGNOSIS — Z8249 Family history of ischemic heart disease and other diseases of the circulatory system: Secondary | ICD-10-CM

## 2013-06-29 DIAGNOSIS — Z87891 Personal history of nicotine dependence: Secondary | ICD-10-CM

## 2013-06-29 DIAGNOSIS — Z823 Family history of stroke: Secondary | ICD-10-CM

## 2013-06-29 DIAGNOSIS — G4733 Obstructive sleep apnea (adult) (pediatric): Secondary | ICD-10-CM | POA: Diagnosis present

## 2013-06-29 DIAGNOSIS — Z90710 Acquired absence of both cervix and uterus: Secondary | ICD-10-CM

## 2013-06-29 DIAGNOSIS — E669 Obesity, unspecified: Secondary | ICD-10-CM | POA: Diagnosis present

## 2013-06-29 DIAGNOSIS — Z820 Family history of epilepsy and other diseases of the nervous system: Secondary | ICD-10-CM

## 2013-06-29 DIAGNOSIS — K580 Irritable bowel syndrome with diarrhea: Secondary | ICD-10-CM | POA: Diagnosis present

## 2013-06-29 DIAGNOSIS — R569 Unspecified convulsions: Secondary | ICD-10-CM | POA: Diagnosis present

## 2013-06-29 DIAGNOSIS — K2270 Barrett's esophagus without dysplasia: Secondary | ICD-10-CM | POA: Diagnosis present

## 2013-06-29 DIAGNOSIS — I509 Heart failure, unspecified: Secondary | ICD-10-CM | POA: Diagnosis present

## 2013-06-29 DIAGNOSIS — I10 Essential (primary) hypertension: Secondary | ICD-10-CM | POA: Diagnosis present

## 2013-06-29 DIAGNOSIS — J9811 Atelectasis: Secondary | ICD-10-CM | POA: Diagnosis present

## 2013-06-29 DIAGNOSIS — J40 Bronchitis, not specified as acute or chronic: Secondary | ICD-10-CM | POA: Diagnosis present

## 2013-06-29 DIAGNOSIS — Z7901 Long term (current) use of anticoagulants: Secondary | ICD-10-CM

## 2013-06-29 DIAGNOSIS — Z6841 Body Mass Index (BMI) 40.0 and over, adult: Secondary | ICD-10-CM | POA: Diagnosis present

## 2013-06-29 DIAGNOSIS — IMO0001 Reserved for inherently not codable concepts without codable children: Secondary | ICD-10-CM | POA: Diagnosis present

## 2013-06-29 DIAGNOSIS — J984 Other disorders of lung: Secondary | ICD-10-CM | POA: Diagnosis present

## 2013-06-29 DIAGNOSIS — R0600 Dyspnea, unspecified: Secondary | ICD-10-CM

## 2013-06-29 HISTORY — DX: Unspecified convulsions: R56.9

## 2013-06-29 LAB — CBC WITH DIFFERENTIAL/PLATELET
Basophils Absolute: 0 10*3/uL (ref 0.0–0.1)
Eosinophils Absolute: 0 10*3/uL (ref 0.0–0.7)
Eosinophils Relative: 0 % (ref 0–5)
HCT: 34.8 % — ABNORMAL LOW (ref 36.0–46.0)
Hemoglobin: 10.7 g/dL — ABNORMAL LOW (ref 12.0–15.0)
Lymphs Abs: 2.3 10*3/uL (ref 0.7–4.0)
MCV: 89.7 fL (ref 78.0–100.0)
Monocytes Absolute: 1.1 10*3/uL — ABNORMAL HIGH (ref 0.1–1.0)
Monocytes Relative: 10 % (ref 3–12)
Neutro Abs: 7.4 10*3/uL (ref 1.7–7.7)
Neutrophils Relative %: 68 % (ref 43–77)
RBC: 3.88 MIL/uL (ref 3.87–5.11)
RDW: 15.4 % (ref 11.5–15.5)

## 2013-06-29 LAB — HEPATIC FUNCTION PANEL
ALT: 8 U/L (ref 0–35)
Albumin: 3.6 g/dL (ref 3.5–5.2)
Alkaline Phosphatase: 56 U/L (ref 39–117)
Indirect Bilirubin: 0.3 mg/dL (ref 0.3–0.9)
Total Bilirubin: 0.5 mg/dL (ref 0.3–1.2)
Total Protein: 6.9 g/dL (ref 6.0–8.3)

## 2013-06-29 LAB — BASIC METABOLIC PANEL
BUN: 9 mg/dL (ref 6–23)
Calcium: 8.2 mg/dL — ABNORMAL LOW (ref 8.4–10.5)
Chloride: 98 mEq/L (ref 96–112)
Creatinine, Ser: 0.57 mg/dL (ref 0.50–1.10)
GFR calc Af Amer: >90 mL/min (ref >90)
GFR calc non Af Amer: >90 mL/min (ref >90)
Glucose, Bld: 279 mg/dL — ABNORMAL HIGH (ref 70–99)

## 2013-06-29 LAB — INFLUENZA PANEL BY PCR (TYPE A & B): H1N1 flu by pcr: NOT DETECTED

## 2013-06-29 MED ORDER — METOPROLOL TARTRATE 1 MG/ML IV SOLN
5.0000 mg | Freq: Once | INTRAVENOUS | Status: AC
  Administered 2013-06-29: 5 mg via INTRAVENOUS
  Filled 2013-06-29: qty 5

## 2013-06-29 MED ORDER — ACETAMINOPHEN 500 MG PO TABS
1000.0000 mg | ORAL_TABLET | Freq: Once | ORAL | Status: AC
  Administered 2013-06-29: 1000 mg via ORAL
  Filled 2013-06-29: qty 2

## 2013-06-29 MED ORDER — POTASSIUM CHLORIDE CRYS ER 20 MEQ PO TBCR
40.0000 meq | EXTENDED_RELEASE_TABLET | Freq: Once | ORAL | Status: AC
  Administered 2013-06-29: 40 meq via ORAL
  Filled 2013-06-29: qty 2

## 2013-06-29 MED ORDER — IOHEXOL 350 MG/ML SOLN
100.0000 mL | Freq: Once | INTRAVENOUS | Status: AC | PRN
  Administered 2013-06-29: 100 mL via INTRAVENOUS

## 2013-06-29 MED ORDER — IPRATROPIUM BROMIDE 0.02 % IN SOLN
0.5000 mg | Freq: Once | RESPIRATORY_TRACT | Status: AC
  Administered 2013-06-29: 0.5 mg via RESPIRATORY_TRACT
  Filled 2013-06-29: qty 2.5

## 2013-06-29 MED ORDER — ALBUTEROL SULFATE (5 MG/ML) 0.5% IN NEBU
5.0000 mg | INHALATION_SOLUTION | Freq: Once | RESPIRATORY_TRACT | Status: AC
  Administered 2013-06-29: 5 mg via RESPIRATORY_TRACT
  Filled 2013-06-29: qty 1

## 2013-06-29 MED ORDER — LEVOFLOXACIN IN D5W 750 MG/150ML IV SOLN
750.0000 mg | Freq: Once | INTRAVENOUS | Status: AC
  Administered 2013-06-29: 750 mg via INTRAVENOUS
  Filled 2013-06-29: qty 150

## 2013-06-29 NOTE — ED Notes (Signed)
MD at bedside. 

## 2013-06-29 NOTE — ED Notes (Signed)
Pt placed on 2L/min via [place], SpO2 dropped to 89%, SpO2 96% on 2L/min via 

## 2013-06-29 NOTE — ED Notes (Signed)
Pt reports was treated for bronchitis on dec 19th.  Reports took levaquin every 4 hours instead of daily as prescribed.  Today had sudden onset of sob and feeling like heart is racing.  Pt also reports has been feeling fatigued and abd cramping for past few days.

## 2013-06-29 NOTE — ED Notes (Signed)
Chest wall pain reproducible with palpation and cough

## 2013-06-29 NOTE — ED Provider Notes (Signed)
CSN: [id number]     Arrival date & time 06/29/13  1651 History  This chart was scribed for Tara Lennert, MD by Clydene Laming, ED Scribe. This patient was seen in room [facility] and the patient's care was started at 5:09 PM.   Chief Complaint  Patient presents with  . Tachycardia  . Shortness of Breath    Patient is a 62 y.o. female presenting with shortness of breath. The history is provided by the patient. No language interpreter was used.  Shortness of Breath Severity:  Moderate Onset quality:  Sudden Duration:  1 day Timing:  Constant Progression:  Unchanged Chronicity:  New Context: URI   Associated symptoms: cough   Associated symptoms: no abdominal pain, no chest pain, no headaches and no rash   HPI Comments: EVEREST HACKING is a 62 y.o. female who presents to the Emergency Department complaining of sudden sob onset today with an associated non-productive cough. Pt was diagnosed with bronchitis three weeks ago and states she thought it was completely relieved. Pt states she feels like her heart is racing and also feels fatigued. Pt has been seen in the ED recently on multiple accounts.    Past Medical History  Diagnosis Date  . Chest pain     Negative cardiac catheterization in 2002; negative stress nuclear study in 2008  . Congestive heart failure     Normal LV systolic function  . PAF (paroxysmal atrial fibrillation) 2009  . Chronic anticoagulation   . Diabetes mellitus, type 2     Insulin therapy; exacerbated by prednisone  . Syncope     Admitted 05/2009; magnetic resonance imagin/ MRA - negative; etiology thought to be orthostasis secondary to drugs and dehydration  . GERD (gastroesophageal reflux disease)   . IBS (irritable bowel syndrome)   . Hiatal hernia   . Gastroparesis     99% retention 05/2008 on GES  . Hyperlipidemia   . Hypertension   . Hypothyroid   . Chronic LBP     Surgical intervention in 1996  . Obesity   . OSA on CPAP   . Anemia     H/H of  10/30 with a normal MCV in 12/09  . Barrett's esophagus     Diagnosed 1995. Last EGD 2008.   . Seizures    Past Surgical History  Procedure Laterality Date  . Cardiovascular stress test  2008    Stress nuclear study  . Back surgery  1996  . Laparoscopic cholecystectomy  1990s  . Total abdominal hysterectomy  1999  . Laminectomy  1995    L4-L5  . Carpal tunnel release  1994  . Esophagogastroduodenoscopy  2008    Barrett's without dysplasia. esphagus dilated. antral erosions, h.pylori serologies negative.  . Colonoscopy  2013    Patient reports normal TCS around 10 years ago; and a repeat study in 2013 that was negative  . Givens capsule study  12/07/2011    Procedure: GIVENS CAPSULE STUDY;  Surgeon: West Bali, MD;  Location: [facility];  Service: Endoscopy;  Laterality: N/A;  . Cardiac catheterization  2002   Family History  Problem Relation Age of Onset  . Hypertension Mother   . Alzheimer's disease Mother   . Stroke Mother   . Heart attack Mother   . Heart disease Neg Hx   . Hypertension Other   . Colon cancer Neg Hx    History  Substance Use Topics  . Smoking status: Former Games developer  . Smokeless tobacco:  Former Neurosurgeon     Comment: Remote  . Alcohol Use: No     Comment: last etoh one year ago, never frequent   OB History   Grav Para Term Preterm Abortions TAB SAB Ect Mult Living                 Review of Systems  Constitutional: Negative for appetite change and fatigue.  HENT: Negative for congestion, ear discharge and sinus pressure.   Eyes: Negative for discharge.  Respiratory: Positive for cough and shortness of breath.   Cardiovascular: Negative for chest pain.  Gastrointestinal: Negative for abdominal pain and diarrhea.  Genitourinary: Negative for frequency and hematuria.  Musculoskeletal: Negative for back pain.  Skin: Negative for rash.  Neurological: Negative for seizures and headaches.  Psychiatric/Behavioral: Negative for hallucinations.     Allergies  Codeine  Home Medications   Current Outpatient Rx  Name  Route  Sig  Dispense  Refill  . acetaminophen (TYLENOL ARTHRITIS PAIN) 650 MG CR tablet   Oral   Take 650 mg by mouth every 8 (eight) hours as needed for pain.         . benzonatate (TESSALON) 100 MG capsule   Oral   Take 1 capsule (100 mg total) by mouth every 8 (eight) hours.   21 capsule   0   . ferrous sulfate 325 (65 FE) MG tablet   Oral   Take 1 tablet (325 mg total) by mouth daily with breakfast.   30 tablet   11   . furosemide (LASIX) 40 MG tablet   Oral   Take 40 mg by mouth daily as needed. Swelling         . glipiZIDE (GLUCOTROL) 10 MG tablet   Oral   Take 10 mg by mouth 2 (two) times daily.          . insulin glargine (LANTUS) 100 UNIT/ML injection   Subcutaneous   Inject 50 Units into the skin at bedtime. *ONLY takes when blood sugar levels exceed 200*         . levETIRAcetam (KEPPRA) 250 MG tablet   Oral   Take 1 tablet (250 mg total) by mouth 2 (two) times daily.   60 tablet   11   . levothyroxine (SYNTHROID, LEVOTHROID) 137 MCG tablet   Oral   Take 137 mcg by mouth daily before breakfast.         . loperamide (ANTI-DIARRHEAL) 2 MG capsule   Oral   Take 2 mg by mouth every other day. Takes as needed for diarrhea         . LORazepam (ATIVAN) 1 MG tablet   Oral   Take 1 mg by mouth 3 (three) times daily as needed. For anxiety         . metaxalone (SKELAXIN) 800 MG tablet   Oral   Take 800 mg by mouth daily.          . metFORMIN (GLUCOPHAGE) 500 MG tablet   Oral   Take 1,000 mg by mouth 2 (two) times daily with a meal.         . metoCLOPramide (REGLAN) 10 MG tablet   Oral   Take 10 mg by mouth 4 (four) times daily.          . metoprolol (LOPRESSOR) 100 MG tablet   Oral   Take 100 mg by mouth 2 (two) times daily.         . nitroGLYCERIN (NITROSTAT) 0.4 MG  SL tablet   Sublingual   Place 0.4 mg under the tongue as needed. For chest pain           . omeprazole (PRILOSEC) 20 MG capsule   Oral   Take 20 mg by mouth 2 (two) times daily.           . ondansetron (ZOFRAN) 8 MG tablet   Oral   Take 8 mg by mouth daily as needed. nausea         . potassium chloride SA (K-DUR,KLOR-CON) 20 MEQ tablet   Oral   Take 20 mEq by mouth 3 (three) times daily.          . simvastatin (ZOCOR) 40 MG tablet   Oral   Take 40 mg by mouth at bedtime.          Marland Kitchen warfarin (COUMADIN) 2.5 MG tablet   Oral   Take 1.25 mg by mouth every evening.          There were no vitals taken for this visit. Physical Exam  Constitutional: She is oriented to person, place, and time. She appears well-developed and well-nourished.  HENT:  Head: Normocephalic and atraumatic.  Eyes: Conjunctivae and EOM are normal. No scleral icterus.  Neck: Neck supple. No thyromegaly present.  Cardiovascular: Exam reveals no gallop and no friction rub.   No murmur heard. Irregular, rapid heartbeat   Pulmonary/Chest: No stridor. She has no wheezes. She has no rales. She exhibits no tenderness.  Abdominal: She exhibits no distension. There is no tenderness. There is no rebound.  Musculoskeletal: Normal range of motion. She exhibits no edema.  Lymphadenopathy:    She has no cervical adenopathy.  Neurological: She is alert and oriented to person, place, and time. She exhibits normal muscle tone. Coordination normal.  Skin: No rash noted. No erythema.  Psychiatric: She has a normal mood and affect. Her behavior is normal.    ED Course  Procedures (including critical care time) DIAGNOSTIC STUDIES: Oxygen Saturation is 97% on RA, normal by my interpretation.    COORDINATION OF CARE: 5:14 PM- Discussed treatment plan with pt at bedside. Pt verbalized understanding and agreement with plan.   Labs Review Labs Reviewed  CBC WITH DIFFERENTIAL  BASIC METABOLIC PANEL  TROPONIN I  PROTIME-INR   Imaging Review No results found.  EKG Interpretation     Date/Time:  Saturday June 29 2013 17:00:05 EST Ventricular Rate:  98 PR Interval:    QRS Duration: 100 QT Interval:  402 QTC Calculation: 513 R Axis:   -33 Text Interpretation:  Atrial fibrillation Left axis deviation T wave abnormality, consider lateral ischemia Prolonged QT Abnormal ECG When compared with ECG of 15-Jun-2013 18:03, Incomplete right bundle branch block is no longer Present Confirmed by Dianah Pruett  MD, Emelly Wurtz ([id number]) on 06/29/2013 10:46:52 PM            MDM  No diagnosis found. I personally performed the services described in this documentation, which was scribed in my presence. The recorded information has been reviewed and is accurate.     Tara Lennert, MD 06/29/13 [phone]

## 2013-06-30 ENCOUNTER — Encounter (HOSPITAL_COMMUNITY): Payer: Self-pay | Admitting: Family Medicine

## 2013-06-30 DIAGNOSIS — I059 Rheumatic mitral valve disease, unspecified: Secondary | ICD-10-CM

## 2013-06-30 DIAGNOSIS — E119 Type 2 diabetes mellitus without complications: Secondary | ICD-10-CM

## 2013-06-30 DIAGNOSIS — I509 Heart failure, unspecified: Secondary | ICD-10-CM

## 2013-06-30 DIAGNOSIS — I4891 Unspecified atrial fibrillation: Secondary | ICD-10-CM

## 2013-06-30 LAB — BASIC METABOLIC PANEL
CO2: 25 mEq/L (ref 19–32)
GFR calc Af Amer: >90 mL/min (ref >90)
GFR calc non Af Amer: >90 mL/min (ref >90)
Potassium: 3.3 mEq/L — ABNORMAL LOW (ref 3.5–5.1)
Sodium: 139 mEq/L (ref 135–145)

## 2013-06-30 LAB — HEMOGLOBIN A1C
Hgb A1c MFr Bld: 10.6 % — ABNORMAL HIGH (ref <5.7)
Mean Plasma Glucose: 258 mg/dL — ABNORMAL HIGH (ref <117)

## 2013-06-30 LAB — CBC WITH DIFFERENTIAL/PLATELET
Basophils Relative: 0 % (ref 0–1)
Hemoglobin: 10.4 g/dL — ABNORMAL LOW (ref 12.0–15.0)
Lymphocytes Relative: 19 % (ref 12–46)
Lymphs Abs: 1.9 10*3/uL (ref 0.7–4.0)
MCHC: 30.8 g/dL (ref 30.0–36.0)
MCV: 89.7 fL (ref 78.0–100.0)
Monocytes Relative: 10 % (ref 3–12)
Neutro Abs: 6.9 10*3/uL (ref 1.7–7.7)
Neutrophils Relative %: 71 % (ref 43–77)
Platelets: 273 10*3/uL (ref 150–400)
RBC: 3.77 MIL/uL — ABNORMAL LOW (ref 3.87–5.11)
RDW: 15.4 % (ref 11.5–15.5)
WBC: 9.8 10*3/uL (ref 4.0–10.5)

## 2013-06-30 LAB — FERRITIN: Ferritin: 18 ng/mL (ref 10–291)

## 2013-06-30 LAB — GLUCOSE, CAPILLARY
Glucose-Capillary: 280 mg/dL — ABNORMAL HIGH (ref 70–99)
Glucose-Capillary: 208 mg/dL — ABNORMAL HIGH (ref 70–99)
Glucose-Capillary: 260 mg/dL — ABNORMAL HIGH (ref 70–99)

## 2013-06-30 LAB — TSH: TSH: 2.021 u[IU]/mL (ref 0.350–4.500)

## 2013-06-30 LAB — RETICULOCYTES
RBC.: 3.77 MIL/uL — ABNORMAL LOW (ref 3.87–5.11)
Retic Count, Absolute: 94.3 10*3/uL (ref 19.0–186.0)
Retic Ct Pct: 2.5 % (ref 0.4–3.1)

## 2013-06-30 LAB — IRON AND TIBC
Iron: 34 ug/dL — ABNORMAL LOW (ref 42–135)
UIBC: 381 ug/dL (ref 125–400)

## 2013-06-30 LAB — MRSA PCR SCREENING: MRSA by PCR: POSITIVE — AB

## 2013-06-30 MED ORDER — NITROGLYCERIN 0.4 MG SL SUBL: 0.4000 mg | SUBLINGUAL_TABLET | SUBLINGUAL | Status: DC | PRN

## 2013-06-30 MED ORDER — METOCLOPRAMIDE HCL 10 MG PO TABS
10.0000 mg | ORAL_TABLET | Freq: Three times a day (TID) | ORAL | Status: DC
  Administered 2013-06-30 – 2013-07-04 (×15): 10 mg via ORAL
  Filled 2013-06-30 (×15): qty 1

## 2013-06-30 MED ORDER — PANTOPRAZOLE SODIUM 40 MG PO TBEC
40.0000 mg | DELAYED_RELEASE_TABLET | Freq: Every day | ORAL | Status: DC
  Administered 2013-06-30 – 2013-07-04 (×5): 40 mg via ORAL
  Filled 2013-06-30 (×5): qty 1

## 2013-06-30 MED ORDER — POTASSIUM CHLORIDE IN NACL 20-0.9 MEQ/L-% IV SOLN
INTRAVENOUS | Status: DC
  Administered 2013-06-30: 10 mL/h via INTRAVENOUS
  Administered 2013-07-03: 09:00:00 via INTRAVENOUS

## 2013-06-30 MED ORDER — LORAZEPAM 1 MG PO TABS
1.0000 mg | ORAL_TABLET | Freq: Three times a day (TID) | ORAL | Status: DC | PRN
  Administered 2013-06-30 – 2013-07-03 (×3): 1 mg via ORAL
  Filled 2013-06-30: qty 1
  Filled 2013-06-30 (×2): qty 2

## 2013-06-30 MED ORDER — LEVOTHYROXINE SODIUM 137 MCG PO TABS
137.0000 ug | ORAL_TABLET | Freq: Every day | ORAL | Status: DC
  Administered 2013-06-30 – 2013-07-04 (×5): 137 ug via ORAL
  Filled 2013-06-30 (×6): qty 1

## 2013-06-30 MED ORDER — DILTIAZEM HCL 100 MG IV SOLR
5.0000 mg/h | INTRAVENOUS | Status: DC
  Administered 2013-06-30: 10 mg/h via INTRAVENOUS
  Administered 2013-06-30: 5 mg/h via INTRAVENOUS
  Administered 2013-06-30: 10 mg/h via INTRAVENOUS
  Filled 2013-06-30: qty 100

## 2013-06-30 MED ORDER — WARFARIN SODIUM 2.5 MG PO TABS
1.2500 mg | ORAL_TABLET | Freq: Once | ORAL | Status: AC
  Administered 2013-06-30: 1.25 mg via ORAL
  Filled 2013-06-30: qty 1

## 2013-06-30 MED ORDER — FUROSEMIDE 10 MG/ML IJ SOLN
20.0000 mg | Freq: Four times a day (QID) | INTRAMUSCULAR | Status: DC
  Administered 2013-06-30 – 2013-07-01 (×5): 20 mg via INTRAVENOUS
  Filled 2013-06-30 (×5): qty 2

## 2013-06-30 MED ORDER — LEVETIRACETAM 500 MG PO TABS
250.0000 mg | ORAL_TABLET | Freq: Two times a day (BID) | ORAL | Status: DC
  Administered 2013-06-30 – 2013-07-04 (×9): 250 mg via ORAL
  Filled 2013-06-30 (×9): qty 1

## 2013-06-30 MED ORDER — ONDANSETRON HCL 4 MG PO TABS
8.0000 mg | ORAL_TABLET | Freq: Three times a day (TID) | ORAL | Status: DC | PRN
  Administered 2013-06-30: 8 mg via ORAL
  Filled 2013-06-30: qty 2

## 2013-06-30 MED ORDER — ACETAMINOPHEN 325 MG PO TABS
650.0000 mg | ORAL_TABLET | Freq: Four times a day (QID) | ORAL | Status: DC | PRN
  Administered 2013-07-03 – 2013-07-04 (×3): 650 mg via ORAL
  Filled 2013-06-30 (×3): qty 2

## 2013-06-30 MED ORDER — METOPROLOL TARTRATE 50 MG PO TABS
100.0000 mg | ORAL_TABLET | Freq: Two times a day (BID) | ORAL | Status: DC
  Administered 2013-06-30 – 2013-07-04 (×8): 100 mg via ORAL
  Filled 2013-06-30: qty 2
  Filled 2013-06-30: qty 4
  Filled 2013-06-30 (×2): qty 2
  Filled 2013-06-30 (×2): qty 4
  Filled 2013-06-30: qty 2

## 2013-06-30 MED ORDER — FERROUS SULFATE 325 (65 FE) MG PO TABS
325.0000 mg | ORAL_TABLET | Freq: Every day | ORAL | Status: DC
  Administered 2013-06-30 – 2013-07-04 (×5): 325 mg via ORAL
  Filled 2013-06-30 (×5): qty 1

## 2013-06-30 MED ORDER — POTASSIUM CHLORIDE 10 MEQ/100ML IV SOLN
10.0000 meq | INTRAVENOUS | Status: AC
  Administered 2013-06-30 (×4): 10 meq via INTRAVENOUS
  Filled 2013-06-30 (×3): qty 100

## 2013-06-30 MED ORDER — LOPERAMIDE HCL 2 MG PO CAPS
2.0000 mg | ORAL_CAPSULE | ORAL | Status: DC
  Administered 2013-06-30 – 2013-07-04 (×3): 2 mg via ORAL
  Filled 2013-06-30 (×3): qty 1

## 2013-06-30 MED ORDER — SIMVASTATIN 20 MG PO TABS: 40.0000 mg | ORAL_TABLET | Freq: Every day | ORAL | Status: DC

## 2013-06-30 MED ORDER — WARFARIN - PHARMACIST DOSING INPATIENT
Status: DC
  Administered 2013-07-03: 16:00:00

## 2013-06-30 MED ORDER — MUPIROCIN 2 % EX OINT
1.0000 "application " | TOPICAL_OINTMENT | Freq: Two times a day (BID) | CUTANEOUS | Status: DC
  Administered 2013-06-30 – 2013-07-04 (×9): 1 via NASAL
  Filled 2013-06-30 (×3): qty 22

## 2013-06-30 MED ORDER — LEVOFLOXACIN IN D5W 750 MG/150ML IV SOLN
750.0000 mg | INTRAVENOUS | Status: DC
  Administered 2013-06-30 – 2013-07-02 (×3): 750 mg via INTRAVENOUS
  Filled 2013-06-30 (×3): qty 150

## 2013-06-30 MED ORDER — INSULIN ASPART 100 UNIT/ML ~~LOC~~ SOLN
0.0000 [IU] | Freq: Three times a day (TID) | SUBCUTANEOUS | Status: DC
  Administered 2013-06-30: 8 [IU] via SUBCUTANEOUS
  Administered 2013-06-30 (×2): 5 [IU] via SUBCUTANEOUS
  Administered 2013-07-01: 11 [IU] via SUBCUTANEOUS
  Administered 2013-07-01: 15 [IU] via SUBCUTANEOUS
  Administered 2013-07-01: 8 [IU] via SUBCUTANEOUS
  Administered 2013-07-02: 11 [IU] via SUBCUTANEOUS
  Administered 2013-07-02: 5 [IU] via SUBCUTANEOUS
  Administered 2013-07-02 – 2013-07-03 (×2): 8 [IU] via SUBCUTANEOUS
  Administered 2013-07-03: 11 [IU] via SUBCUTANEOUS
  Administered 2013-07-03 – 2013-07-04 (×2): 8 [IU] via SUBCUTANEOUS

## 2013-06-30 MED ORDER — CHLORHEXIDINE GLUCONATE CLOTH 2 % EX PADS
6.0000 | MEDICATED_PAD | Freq: Every day | CUTANEOUS | Status: AC
  Administered 2013-06-30 – 2013-07-04 (×5): 6 via TOPICAL

## 2013-06-30 MED ORDER — WARFARIN - PHARMACIST DOSING INPATIENT: Freq: Every day | Status: DC

## 2013-06-30 MED ORDER — ATORVASTATIN CALCIUM 20 MG PO TABS
20.0000 mg | ORAL_TABLET | Freq: Every day | ORAL | Status: DC
  Administered 2013-06-30 – 2013-07-03 (×4): 20 mg via ORAL
  Filled 2013-06-30 (×4): qty 1

## 2013-06-30 NOTE — Progress Notes (Signed)
Subjective: Patient was admitted due to shortness of breath. She is feeling much better today. No fever or chills.  Objective: Vital signs in last 24 hours: Temp:  [97.8 F (36.6 C)-98.8 F (37.1 C)] 98.7 F (37.1 C) (12/28 0800) Pulse Rate:  [40-161] 113 (12/28 0800) Resp:  [7-32] 27 (12/28 0800) BP: (108-179)/(43-132) 151/95 mmHg (12/28 0800) SpO2:  [75 %-99 %] 95 % (12/28 0800) FiO2 (%):  [98 %] 98 % (12/27 1929) Weight:  [108.1 kg (238 lb 5.1 oz)] 108.1 kg (238 lb 5.1 oz) (12/28 0500) Weight change:  Last BM Date: 06/30/13  Intake/Output from previous day: 12/27 0701 - 12/28 0700 In: 133.1 [I.V.:33.1; IV Piggyback:100] Out: -   PHYSICAL EXAM General appearance: alert and no distress Resp: diminished breath sounds bilaterally and rhonchi bilaterally Cardio: irregularly irregular rhythm GI: soft, non-tender; bowel sounds normal; no masses,  no organomegaly Extremities: extremities normal, atraumatic, no cyanosis or edema  Lab Results:    @labtest @ [facility] No results found for this basename: PHART, PCO2, PO2ART, TCO2, HCO3,  in the last 72 hours CULTURES Recent Results (from the past 240 hour(s))  MRSA PCR SCREENING     Status: Abnormal   Collection Time    06/30/13  3:00 AM      Result Value Range Status   MRSA by PCR POSITIVE (*) NEGATIVE Final   Comment: RESULT CALLED TO, READ BACK BY AND VERIFIED WITH:     SMITH J. AT [id number] ON [id number] BY THOMPSON S.                The GeneXpert MRSA Assay (FDA     approved for NASAL specimens     only), is one component of a     comprehensive MRSA colonization     surveillance program. It is not     intended to diagnose MRSA     infection nor to guide or     monitor treatment for     MRSA infections.  CULTURE, BLOOD (ROUTINE X 2)     Status: None   Collection Time    06/30/13  3:18 AM      Result Value Range Status   Specimen Description Blood RIGHT ANTECUBITAL   Final   Special Requests BOTTLES DRAWN AEROBIC AND  ANAEROBIC 6CC   Final   Culture NO GROWTH <24 HRS   Final   Report Status PENDING   Incomplete  CULTURE, BLOOD (ROUTINE X 2)     Status: None   Collection Time    06/30/13  3:28 AM      Result Value Range Status   Specimen Description Blood BLOOD RIGHT HAND   Final   Special Requests BOTTLES DRAWN AEROBIC AND ANAEROBIC 5CC   Final   Culture NO GROWTH <24 HRS   Final   Report Status PENDING   Incomplete   Studies/Results: Ct Angio Chest Pe W/cm &/or Wo Cm  06/29/2013   CLINICAL DATA:  Shortness of breath, weakness and chest pain. History of smoking.  EXAM: CT ANGIOGRAPHY CHEST WITH CONTRAST  TECHNIQUE: Multidetector CT imaging of the chest was performed using the standard protocol during bolus administration of intravenous contrast. Multiplanar CT image reconstructions including MIPs were obtained to evaluate the vascular anatomy.  CONTRAST:  OMNIPAQUE IOHEXOL 350 MG/ML SOLN  COMPARISON:  Chest radiograph performed earlier today at 05:12 p.m., and CTA of the chest performed 10/16/2006  FINDINGS: There is no evidence of pulmonary embolus.  Patchy airspace opacity is noted within  the left lung, and minimal right-sided airspace opacity is seen, with underlying interstitial prominence. The appearance is more suggestive of atelectasis, though mild pneumonia might have such an appearance. There is no evidence of pleural effusion or pneumothorax. No masses are identified; no abnormal focal contrast enhancement is seen.  There is bilateral prominence of the pulmonary arteries, with tapering seen distally, raising concern for mild pulmonary arterial hypertension. Mild biatrial enlargement is noted. Calcification is seen at the mitral valve. The mediastinum is otherwise unremarkable in appearance. No mediastinal lymphadenopathy is seen. The great vessel grossly unremarkable. No pericardial effusion is identified. No axillary lymphadenopathy is seen. The thyroid gland is not visualized.  There is reflux of  contrast into the IVC and hepatic veins. The visualized portions of the spleen are unremarkable. There is mild diffuse fatty infiltration involving the pancreas.  No acute osseous abnormalities are seen.  Review of the MIP images confirms the above findings.  IMPRESSION: 1. No evidence of pulmonary embolus. 2. Patchy airspace opacity within the left lung, and minimal right-sided airspace opacity, with underlying interstitial prominence. The appearance is more suggestive of atelectasis, though mild pneumonia might have such an appearance. 3. Prominence of the pulmonary arteries, with apparent distal tapering, raising concern for mild pulmonary arterial hypertension. Mild biatrial enlargement noted. Calcification seen at the mitral valve. 4. Reflux of contrast incidentally noted into the IVC and hepatic veins.   Electronically Signed   By: Roanna Raider M.D.   On: 06/29/2013 21:18   Dg Chest Portable 1 View  06/29/2013   CLINICAL DATA:  Feels like heart is ray seen.  EXAM: PORTABLE CHEST - 1 VIEW  COMPARISON:  06/15/2013  FINDINGS: There is moderate enlargement of the cardiopericardial silhouette. The mediastinum is normal in contour. No convincing hilar masses.  There is opacity that extends laterally from the left hilum, likely atelectasis. Infiltrate is not excluded.  Prominent bronchovascular markings are noted bilaterally which are stable. No pulmonary edema. No pleural effusion or pneumothorax is seen.  The bony thorax is demineralized but grossly intact.  IMPRESSION: No convincing acute cardiopulmonary disease. Left mid lung opacity is likely atelectasis. Moderate cardiomegaly.   Electronically Signed   By: Amie Portland M.D.   On: 06/29/2013 17:27    Medications: I have reviewed the patient's current medications.  Assesment: Active Problems:   HYPOTHYROIDISM, PRIMARY   DIABETES MELLITUS, TYPE II, ON INSULIN   Hyperlipidemia   OBESITY   ANEMIA, IRON DEFICIENCY   HYPERTENSION   Atrial  fibrillation   CONGESTIVE HEART FAILURE, MILD   LUNG NODULE   Barrett's esophagus   Gastroparesis   Irritable bowel syndrome   LOW BACK PAIN, CHRONIC   Chronic anticoagulation   Tremor   Dyspnea    Plan: Medications reviewed Continue current treatment Continue anticoagulation Continue oxygen supplemnt Will follow culture results    LOS: 1 day   Eulon Allnutt 06/30/2013, 9:36 AM

## 2013-06-30 NOTE — Progress Notes (Signed)
ANTICOAGULATION CONSULT NOTE - Initial Consult  Pharmacy Consult for Warfarin Indication: atrial fibrillation  Allergies  Allergen Reactions  . Celexa [Citalopram Hydrobromide]   . Codeine Nausea And Vomiting    HALLUCINATIONS    Patient Measurements: Height: 5\' 8"  (172.7 cm) Weight: 238 lb 5.1 oz (108.1 kg) IBW/kg (Calculated) : 63.9 Heparin Dosing Weight:   Vital Signs: Temp: 98.6 F (37 C) (12/28 0400) Temp src: Oral (12/28 0400) BP: 151/103 mmHg (12/28 0630) Pulse Rate: 40 (12/28 0245)  Labs:  Recent Labs  06/29/13 1718 06/30/13 0318 06/30/13 0329  HGB 10.7* 10.4*  --   HCT 34.8* 33.8*  --   PLT 272 273  --   LABPROT 19.4*  --  20.7*  INR 1.69*  --  1.84*  CREATININE 0.57 0.57  --   TROPONINI <0.30  --   --     Estimated Creatinine Clearance: 91.5 ml/min (by C-G formula based on Cr of 0.57).   Medical History: Past Medical History  Diagnosis Date  . Chest pain     Negative cardiac catheterization in 2002; negative stress nuclear study in 2008  . Congestive heart failure     Normal LV systolic function  . PAF (paroxysmal atrial fibrillation) 2009  . Chronic anticoagulation   . Diabetes mellitus, type 2     Insulin therapy; exacerbated by prednisone  . Syncope     Admitted 05/2009; magnetic resonance imagin/ MRA - negative; etiology thought to be orthostasis secondary to drugs and dehydration  . GERD (gastroesophageal reflux disease)   . IBS (irritable bowel syndrome)   . Hiatal hernia   . Gastroparesis     99% retention 05/2008 on GES  . Hyperlipidemia   . Hypertension   . Hypothyroid   . Chronic LBP     Surgical intervention in 1996  . Obesity   . OSA on CPAP   . Anemia     H/H of 10/30 with a normal MCV in 12/09  . Barrett's esophagus     Diagnosed 1995. Last EGD 2008.   . Seizures     Medications:  Scheduled:  . Chlorhexidine Gluconate Cloth  6 each Topical Q0600  . ferrous sulfate  325 mg Oral Q breakfast  . furosemide  20 mg  Intravenous Q6H  . insulin aspart  0-15 Units Subcutaneous TID WC  . levETIRAcetam  250 mg Oral BID  . levofloxacin (LEVAQUIN) IV  750 mg Intravenous Q24H  . levothyroxine  137 mcg Oral QAC breakfast  . loperamide  2 mg Oral QODAY  . metoCLOPramide  10 mg Oral TID AC & HS  . metoprolol  100 mg Oral BID  . mupirocin ointment  1 application Nasal BID  . pantoprazole  40 mg Oral Daily  . simvastatin  40 mg Oral QHS  . Warfarin - Pharmacist Dosing Inpatient   Does not apply q1800    Assessment: Continuation of Warfarin from home for atrial fibrillation PTA Warfarin 1.25 mg po daily Nose bleeds in past week, INR > 6, most likely to taking Levaquin every 4 hours, instead of daily INR 1.84 today, subtherapeutic  Goal of Therapy:  INR 2-3 Monitor platelets by anticoagulation protocol: Yes   Plan:  Warfarin 1.25 mg po today (home dose) INR/PT daily   Raquel James, Glayds In[place]o Bennett 06/30/2013,8:50 AM

## 2013-06-30 NOTE — H&P (Signed)
[facility] History and Physical  [name]:[id number] DOB: [birth year] DOA: 06/29/2013  Referring physician: ED   PCP: [name], MD  Specialists: Dr. Gerilyn Pilgrim  Chief Complaint: short of breath and palpitations  HPI: [name] is a 62 y.o. female  This patient was evaluated in the emergency department today for shortness of breath and palpitations. A few weeks ago she felt like she had bronchitis and was treated with a Z-Pak. One week ago she saw her primary care physician because she didn't feel like a bronchitis was improving. She was started on oral Levaquin. She misunderstood the directions and took one pill every 4 hours instead of as directed. She feels that the cough associated with bronchitis has resolved. The last couple of days she has noted increased coughing as well as increased swelling of her lower extremities. She was much more short of breath than usual today and so came to the emergency room. The shortness of breath was associated with a feeling of her heart racing. They occurred when she was laying down on a couch.  She does have a history of mild congestive heart failure as well as atrial fibrillation. She is on chronic anticoagulation (Coumadin) due to the atrial fibrillation. She had 2 severe nosebleeds this past week due to her INR being over 6. This is attributed to her taking too much Levaquin.  Review of Systems: The patient denies anorexia, fever, weight loss,, vision loss, decreased hearing, hoarseness, chest pain, syncope,  balance deficits, hemoptysis, abdominal pain, melena, hematochezia, severe indigestion/heartburn, hematuria, incontinence, genital sores, muscle weakness, suspicious skin lesions, transient blindness, difficulty walking, depression, unusual weight change, abnormal bleeding, enlarged lymph nodes, angioedema, and breast masses.   Past Medical History  Diagnosis Date  . Chest pain     Negative cardiac catheterization in 2002;  negative stress nuclear study in 2008  . Congestive heart failure     Normal LV systolic function  . PAF (paroxysmal atrial fibrillation) 2009  . Chronic anticoagulation   . Diabetes mellitus, type 2     Insulin therapy; exacerbated by prednisone  . Syncope     Admitted 05/2009; magnetic resonance imagin/ MRA - negative; etiology thought to be orthostasis secondary to drugs and dehydration  . GERD (gastroesophageal reflux disease)   . IBS (irritable bowel syndrome)   . Hiatal hernia   . Gastroparesis     99% retention 05/2008 on GES  . Hyperlipidemia   . Hypertension   . Hypothyroid   . Chronic LBP     Surgical intervention in 1996  . Obesity   . OSA on CPAP   . Anemia     H/H of 10/30 with a normal MCV in 12/09  . Barrett's esophagus     Diagnosed 1995. Last EGD 2008.   . Seizures    Past Surgical History  Procedure Laterality Date  . Cardiovascular stress test  2008    Stress nuclear study  . Back surgery  1996  . Laparoscopic cholecystectomy  1990s  . Total abdominal hysterectomy  1999  . Laminectomy  1995    L4-L5  . Carpal tunnel release  1994  . Esophagogastroduodenoscopy  2008    Barrett's without dysplasia. esphagus dilated. antral erosions, h.pylori serologies negative.  . Colonoscopy  2013    Patient reports normal TCS around 10 years ago; and a repeat study in 2013 that was negative  . Givens capsule study  12/07/2011    Procedure: GIVENS CAPSULE STUDY;  Surgeon:  West Bali, MD;  Location: [facility];  Service: Endoscopy;  Laterality: N/A;  . Cardiac catheterization  2002   Social History:  reports that she quit smoking about 30 years ago. She has never used smokeless tobacco. She reports that she does not drink alcohol or use illicit drugs. She lives at home and participates in ADLs  Allergies  Allergen Reactions  . Codeine Nausea And Vomiting    HALLUCINATIONS    Family History  Problem Relation Age of Onset  . Hypertension Mother   .  Alzheimer's disease Mother   . Stroke Mother   . Heart attack Mother   . Heart disease Neg Hx   . Hypertension Other   . Colon cancer Neg Hx     Prior to Admission medications   Medication Sig Start Date End Date Taking? Authorizing Provider  acetaminophen (TYLENOL ARTHRITIS PAIN) 650 MG CR tablet Take 650 mg by mouth every 8 (eight) hours as needed for pain.   Yes Historical Provider, MD  benzonatate (TESSALON) 100 MG capsule Take 1 capsule (100 mg total) by mouth every 8 (eight) hours. 06/15/13  Yes [name], MD  ferrous sulfate 325 (65 FE) MG tablet Take 1 tablet (325 mg total) by mouth daily with breakfast. 12/26/12  Yes Kathlen Brunswick, MD  furosemide (LASIX) 40 MG tablet Take 40 mg by mouth daily as needed. Swelling 11/01/11  Yes Historical Provider, MD  glipiZIDE (GLUCOTROL) 10 MG tablet Take 10 mg by mouth 2 (two) times daily.    Yes Historical Provider, MD  insulin glargine (LANTUS) 100 UNIT/ML injection Inject 50 Units into the skin at bedtime. *ONLY takes when blood sugar levels exceed 200*   Yes Historical Provider, MD  levETIRAcetam (KEPPRA) 250 MG tablet Take 1 tablet (250 mg total) by mouth 2 (two) times daily. 11/10/12  Yes [name], MD  levothyroxine (SYNTHROID, LEVOTHROID) 137 MCG tablet Take 137 mcg by mouth daily before breakfast.   Yes Historical Provider, MD  loperamide (ANTI-DIARRHEAL) 2 MG capsule Take 2 mg by mouth every other day. Takes as needed for diarrhea   Yes Historical Provider, MD  LORazepam (ATIVAN) 1 MG tablet Take 1 mg by mouth 3 (three) times daily as needed. For anxiety   Yes Historical Provider, MD  metaxalone (SKELAXIN) 800 MG tablet Take 800 mg by mouth daily.    Yes Historical Provider, MD  metFORMIN (GLUCOPHAGE) 500 MG tablet Take 1,000 mg by mouth 2 (two) times daily with a meal.   Yes Historical Provider, MD  metoCLOPramide (REGLAN) 10 MG tablet Take 10 mg by mouth 4 (four) times daily.  09/20/12  Yes Historical Provider, MD   metoprolol (LOPRESSOR) 100 MG tablet Take 100 mg by mouth 2 (two) times daily.   Yes Historical Provider, MD  nitroGLYCERIN (NITROSTAT) 0.4 MG SL tablet Place 0.4 mg under the tongue as needed. For chest pain    Yes Historical Provider, MD  omeprazole (PRILOSEC) 20 MG capsule Take 20 mg by mouth 2 (two) times daily.     Yes Historical Provider, MD  ondansetron (ZOFRAN) 8 MG tablet Take 8 mg by mouth daily as needed. nausea 11/01/11  Yes Historical Provider, MD  potassium chloride SA (K-DUR,KLOR-CON) 20 MEQ tablet Take 20 mEq by mouth 3 (three) times daily.    Yes Historical Provider, MD  simvastatin (ZOCOR) 40 MG tablet Take 40 mg by mouth at bedtime.    Yes Historical Provider, MD  warfarin (COUMADIN) 2.5 MG tablet  Take 1.25 mg by mouth every evening.   Yes Historical Provider, MD  levofloxacin (LEVAQUIN) 500 MG tablet Take 500 mg by mouth daily. 06/21/13   Historical Provider, MD   Physical Exam: Filed Vitals:   06/30/13 0200  BP: 179/132  Pulse: 95  Temp:   Resp: 28    Nursing note and vitals reviewed. Constitutional: She is oriented to person, place, and time. She appears well-developed and well-nourished.  HENT:  Nose: Nose normal. No bleeding. Mouth/Throat: Oropharynx is clear and moist. No oropharyngeal exudate.  Eyes: Conjunctivae are normal. Pupils are equal, round, and reactive to light.  Neck: Normal range of motion. Neck supple. No thyromegaly present.  Cardiovascular: Normal rate, regular rhythm and normal heart sounds.   Pulmonary/Chest: Effort normal and breath sounds normal.  Abdominal: Soft. Bowel sounds are normal. She exhibits no distension. There is no tenderness. There is no rebound.  Lymphadenopathy:    She has no cervical adenopathy.  Neurological: She is alert and oriented to person, place, and time. She has normal reflexes.  Skin: Skin is warm and dry.  Psychiatric: She has a normal mood and affect. Her behavior is normal.  msk - 2+ pitting edema to mid  shin. Dp/pt intact 2+ b/l   Labs on Admission:  Basic Metabolic Panel:  Recent Labs Lab 06/29/13 1718  NA 136  K 3.1*  CL 98  CO2 24  GLUCOSE 279*  BUN 9  CREATININE 0.57  CALCIUM 8.2*   Liver Function Tests:  Recent Labs Lab 06/29/13 1718  AST 13  ALT 8  ALKPHOS 56  BILITOT 0.5  PROT 6.9  ALBUMIN 3.6   No results found for this basename: LIPASE, AMYLASE,  in the last 168 hours No results found for this basename: AMMONIA,  in the last 168 hours CBC:  Recent Labs Lab 06/29/13 1718  WBC 10.9*  NEUTROABS 7.4  HGB 10.7*  HCT 34.8*  MCV 89.7  PLT 272   Cardiac Enzymes:  Recent Labs Lab 06/29/13 1718  TROPONINI <0.30    BNP (last 3 results)  Recent Labs  06/15/13 1645 06/29/13 1718  PROBNP 3732.0* 5072.0*   CBG: No results found for this basename: GLUCAP,  in the last 168 hours  Radiological Exams on Admission: Ct Angio Chest Pe W/cm &/or Wo Cm  06/29/2013   CLINICAL DATA:  Shortness of breath, weakness and chest pain. History of smoking.  EXAM: CT ANGIOGRAPHY CHEST WITH CONTRAST  TECHNIQUE: Multidetector CT imaging of the chest was performed using the standard protocol during bolus administration of intravenous contrast. Multiplanar CT image reconstructions including MIPs were obtained to evaluate the vascular anatomy.  CONTRAST:  OMNIPAQUE IOHEXOL 350 MG/ML SOLN  COMPARISON:  Chest radiograph performed earlier today at 05:12 p.m., and CTA of the chest performed 10/16/2006  FINDINGS: There is no evidence of pulmonary embolus.  Patchy airspace opacity is noted within the left lung, and minimal right-sided airspace opacity is seen, with underlying interstitial prominence. The appearance is more suggestive of atelectasis, though mild pneumonia might have such an appearance. There is no evidence of pleural effusion or pneumothorax. No masses are identified; no abnormal focal contrast enhancement is seen.  There is bilateral prominence of the pulmonary  arteries, with tapering seen distally, raising concern for mild pulmonary arterial hypertension. Mild biatrial enlargement is noted. Calcification is seen at the mitral valve. The mediastinum is otherwise unremarkable in appearance. No mediastinal lymphadenopathy is seen. The great vessel grossly unremarkable. No pericardial effusion is  identified. No axillary lymphadenopathy is seen. The thyroid gland is not visualized.  There is reflux of contrast into the IVC and hepatic veins. The visualized portions of the spleen are unremarkable. There is mild diffuse fatty infiltration involving the pancreas.  No acute osseous abnormalities are seen.  Review of the MIP images confirms the above findings.  IMPRESSION: 1. No evidence of pulmonary embolus. 2. Patchy airspace opacity within the left lung, and minimal right-sided airspace opacity, with underlying interstitial prominence. The appearance is more suggestive of atelectasis, though mild pneumonia might have such an appearance. 3. Prominence of the pulmonary arteries, with apparent distal tapering, raising concern for mild pulmonary arterial hypertension. Mild biatrial enlargement noted. Calcification seen at the mitral valve. 4. Reflux of contrast incidentally noted into the IVC and hepatic veins.   Electronically Signed   By: Roanna Raider M.D.   On: 06/29/2013 21:18   Dg Chest Portable 1 View  06/29/2013   CLINICAL DATA:  Feels like heart is ray seen.  EXAM: PORTABLE CHEST - 1 VIEW  COMPARISON:  06/15/2013  FINDINGS: There is moderate enlargement of the cardiopericardial silhouette. The mediastinum is normal in contour. No convincing hilar masses.  There is opacity that extends laterally from the left hilum, likely atelectasis. Infiltrate is not excluded.  Prominent bronchovascular markings are noted bilaterally which are stable. No pulmonary edema. No pleural effusion or pneumothorax is seen.  The bony thorax is demineralized but grossly intact.  IMPRESSION: No  convincing acute cardiopulmonary disease. Left mid lung opacity is likely atelectasis. Moderate cardiomegaly.   Electronically Signed   By: Amie Portland M.D.   On: 06/29/2013 17:27    EKG: Independently reviewed. atrial fibrillation  Assessment/Plan Active Problems:   HYPOTHYROIDISM, PRIMARY   DIABETES MELLITUS, TYPE II, ON INSULIN   Hyperlipidemia   OBESITY   ANEMIA, IRON DEFICIENCY   HYPERTENSION   Atrial fibrillation   CONGESTIVE HEART FAILURE, MILD   LUNG NODULE   Barrett's esophagus   Gastroparesis   Irritable bowel syndrome   LOW BACK PAIN, CHRONIC   Chronic anticoagulation   Tremor   Dyspnea   1. Shortness of breath with hypoxia: The patient is requiring 3 L to maintain oxygen saturations above 92 at this time. We'll continue to titrate oxygen as needed. Given the CT finding that they could not rule out pneumonia we'll continue Levaquin for now. The patient also had a slightly increased white count. My suspicion is the white count may be reactive more than infection induced. A flu PCR did come back negative. Will see how her oxygen levels do once we can resolve her tachycardia and extra fluid. Next 2. Atrial fibrillation: After her heart rate persisted in the 150s and the patient felt short of breath started a Cardizem drip. 3. Hypokalemia: Replace following a more 4. Hypocalcemia: Check vitamin D mag intact PTH and phosphorus. Alkaline phosphatase was normal. 5. Peripheral edema: With her beat and he of 5072 I suspect this is due to congestive heart failure. Will start Lasix 20 mg IV every 6 and watch her electrolytes. Will also get an echocardiogram to assess cardiac function. 6. Leukocytosis: Very mild, likely reactionary, covering with Levaquin. 7. Anemia: Hemoglobin today is 10.7. The patient says she has a history of iron deficiency anemia. Will check an anemia profile. 8. Hypothyroidism: We'll check a TSH 9. Diabetes type 2 on insulin: Sliding scale insulin, hold oral  meds for now. 10. Hyperlipidemia: Continue statin 11. Hypertension: Continue home meds 12. History  of gastroparesis: Likely secondary to diabetes type 2. Continue Reglan. 13. Obstructive sleep apnea: Patient says she does not usually wear CPAP because she does not like it. Have ordered it and had her brought to the room. The patient understands that if she is dropping her oxygenation at night she may need to wear. 14. Chronic anticoagulation: INR today was 1.6. We'll order Coumadin with pharmacy dosing. 15. Tremor: Not evident on today's exam. This has in the past and thought to be a form of tardive dyskinesia and secondary to Celexa. Will update her allergy list 16. GERD: Continue her PPI 17. History of complex partial seizures: In May of 2014 the patient had several syncopal episodes. She was admitted and although her EEG was within normal limits Dr. Gerilyn Pilgrim felt that her symptoms were due to complex partial seizures. She was started on Keppra at that time and hasn't had any symptoms since. She follows with him as an outpatient.    Code Status: full Family Communication: daughter in room with patient Disposition Plan: stepdown ICU for cardizem drip  Time spent: 75 minutes  Acey Lav [facility] Pager [phone]  If 7PM-7AM, please contact night-coverage [email or web address] Password [facility] 06/30/2013, 3:39 AM

## 2013-06-30 NOTE — Progress Notes (Signed)
ANTIBIOTIC CONSULT NOTE - INITIAL  Pharmacy Consult for Levaquin Indication: pneumonia  Allergies  Allergen Reactions  . Celexa [Citalopram Hydrobromide]   . Codeine Nausea And Vomiting    HALLUCINATIONS    Patient Measurements: Height: 5\' 8"  (172.7 cm) Weight: 238 lb 5.1 oz (108.1 kg) IBW/kg (Calculated) : 63.9 Adjusted Body Weight:   Vital Signs: Temp: 98.6 F (37 C) (12/28 0400) Temp src: Oral (12/28 0400) BP: 151/103 mmHg (12/28 0630) Pulse Rate: 40 (12/28 0245) Intake/Output from previous day: 12/27 0701 - 12/28 0700 In: 133.1 [I.V.:33.1; IV Piggyback:100] Out: -  Intake/Output from this shift:    Labs:  Recent Labs  06/29/13 1718 06/30/13 0318  WBC 10.9* 9.8  HGB 10.7* 10.4*  PLT 272 273  CREATININE 0.57 0.57   Estimated Creatinine Clearance: 91.5 ml/min (by C-G formula based on Cr of 0.57). No results found for this basename: VANCOTROUGH, Leodis Binet, VANCORANDOM, GENTTROUGH, GENTPEAK, GENTRANDOM, TOBRATROUGH, TOBRAPEAK, TOBRARND, AMIKACINPEAK, AMIKACINTROU, AMIKACIN,  in the last 72 hours   Microbiology: Recent Results (from the past 720 hour(s))  MRSA PCR SCREENING     Status: Abnormal   Collection Time    06/30/13  3:00 AM      Result Value Range Status   MRSA by PCR POSITIVE (*) NEGATIVE Final   Comment: RESULT CALLED TO, READ BACK BY AND VERIFIED WITH:     SMITH J. AT [id number] ON [id number] BY THOMPSON S.                The GeneXpert MRSA Assay (FDA     approved for NASAL specimens     only), is one component of a     comprehensive MRSA colonization     surveillance program. It is not     intended to diagnose MRSA     infection nor to guide or     monitor treatment for     MRSA infections.  CULTURE, BLOOD (ROUTINE X 2)     Status: None   Collection Time    06/30/13  3:18 AM      Result Value Range Status   Specimen Description Blood RIGHT ANTECUBITAL   Final   Special Requests BOTTLES DRAWN AEROBIC AND ANAEROBIC 6CC   Final   Culture NO GROWTH  <24 HRS   Final   Report Status PENDING   Incomplete  CULTURE, BLOOD (ROUTINE X 2)     Status: None   Collection Time    06/30/13  3:28 AM      Result Value Range Status   Specimen Description Blood BLOOD RIGHT HAND   Final   Special Requests BOTTLES DRAWN AEROBIC AND ANAEROBIC 5CC   Final   Culture NO GROWTH <24 HRS   Final   Report Status PENDING   Incomplete    Medical History: Past Medical History  Diagnosis Date  . Chest pain     Negative cardiac catheterization in 2002; negative stress nuclear study in 2008  . Congestive heart failure     Normal LV systolic function  . PAF (paroxysmal atrial fibrillation) 2009  . Chronic anticoagulation   . Diabetes mellitus, type 2     Insulin therapy; exacerbated by prednisone  . Syncope     Admitted 05/2009; magnetic resonance imagin/ MRA - negative; etiology thought to be orthostasis secondary to drugs and dehydration  . GERD (gastroesophageal reflux disease)   . IBS (irritable bowel syndrome)   . Hiatal hernia   . Gastroparesis     99% retention  05/2008 on GES  . Hyperlipidemia   . Hypertension   . Hypothyroid   . Chronic LBP     Surgical intervention in 1996  . Obesity   . OSA on CPAP   . Anemia     H/H of 10/30 with a normal MCV in 12/09  . Barrett's esophagus     Diagnosed 1995. Last EGD 2008.   . Seizures     Medications:  Scheduled:  . Chlorhexidine Gluconate Cloth  6 each Topical Q0600  . ferrous sulfate  325 mg Oral Q breakfast  . furosemide  20 mg Intravenous Q6H  . insulin aspart  0-15 Units Subcutaneous TID WC  . levETIRAcetam  250 mg Oral BID  . levofloxacin (LEVAQUIN) IV  750 mg Intravenous Q24H  . levothyroxine  137 mcg Oral QAC breakfast  . loperamide  2 mg Oral QODAY  . metoCLOPramide  10 mg Oral TID AC & HS  . metoprolol  100 mg Oral BID  . mupirocin ointment  1 application Nasal BID  . pantoprazole  40 mg Oral Daily  . simvastatin  40 mg Oral QHS  . Warfarin - Pharmacist Dosing Inpatient   Does  not apply q1800   Assessment: Levaquin as outpatient for bronchitis. Patient confused with directions and took incorrectly Admitted for shortness of breath, pneumonia Levaquin 750 mg IV given in ED CrCl 91.5 ml/min  Goal of Therapy:  Eradicate infection  Plan:  Levaquin 750 mg IV every 24 hours Monitor renal function Labs per protocol  Raquel James, Rohil Lesch Bennett 06/30/2013,8:46 AM

## 2013-06-30 NOTE — Progress Notes (Signed)
Patient refuses CPAP, says she is taking lasix and is going to be up and down to the bathroom, told me she would try tomorrow night. RT will continue to monitor

## 2013-07-01 ENCOUNTER — Encounter: Payer: Medicare Other | Admitting: Adult Health

## 2013-07-01 LAB — GLUCOSE, CAPILLARY
Glucose-Capillary: 309 mg/dL — ABNORMAL HIGH (ref 70–99)
Glucose-Capillary: 299 mg/dL — ABNORMAL HIGH (ref 70–99)

## 2013-07-01 LAB — VITAMIN D 25 HYDROXY (VIT D DEFICIENCY, FRACTURES): Vit D, 25-Hydroxy: 35 ng/mL (ref 30–89)

## 2013-07-01 LAB — PTH, INTACT AND CALCIUM
Calcium, Total (PTH): 8.4 mg/dL (ref 8.4–10.5)
PTH: 115.7 pg/mL — ABNORMAL HIGH (ref 14.0–72.0)

## 2013-07-01 LAB — PROTIME-INR: Prothrombin Time: 22 seconds — ABNORMAL HIGH (ref 11.6–15.2)

## 2013-07-01 LAB — LEGIONELLA ANTIGEN, URINE: Legionella Antigen, Urine: NEGATIVE

## 2013-07-01 MED ORDER — DILTIAZEM HCL 30 MG PO TABS
30.0000 mg | ORAL_TABLET | Freq: Four times a day (QID) | ORAL | Status: DC
  Administered 2013-07-01 – 2013-07-03 (×9): 30 mg via ORAL
  Filled 2013-07-01 (×9): qty 1

## 2013-07-01 MED ORDER — INSULIN GLARGINE 100 UNIT/ML ~~LOC~~ SOLN
30.0000 [IU] | Freq: Every day | SUBCUTANEOUS | Status: DC
  Administered 2013-07-01 – 2013-07-02 (×2): 30 [IU] via SUBCUTANEOUS
  Filled 2013-07-01 (×2): qty 0.3

## 2013-07-01 MED ORDER — WARFARIN SODIUM 5 MG PO TABS: 5.0000 mg | ORAL_TABLET | Freq: Once | ORAL | Status: DC

## 2013-07-01 MED ORDER — WARFARIN SODIUM 2.5 MG PO TABS
1.2500 mg | ORAL_TABLET | Freq: Once | ORAL | Status: AC
  Administered 2013-07-01: 1.25 mg via ORAL
  Filled 2013-07-01: qty 1

## 2013-07-01 MED ORDER — FUROSEMIDE 40 MG PO TABS
40.0000 mg | ORAL_TABLET | Freq: Every day | ORAL | Status: DC
  Administered 2013-07-01 – 2013-07-04 (×4): 40 mg via ORAL
  Filled 2013-07-01: qty 1
  Filled 2013-07-01: qty 2
  Filled 2013-07-01 (×2): qty 1

## 2013-07-01 MED ORDER — POTASSIUM CHLORIDE CRYS ER 20 MEQ PO TBCR
40.0000 meq | EXTENDED_RELEASE_TABLET | Freq: Once | ORAL | Status: AC
  Administered 2013-07-01: 40 meq via ORAL
  Filled 2013-07-01: qty 2

## 2013-07-01 NOTE — Progress Notes (Signed)
HPI: [name] is medical cocaine and packing. a 62 year old former patient of Dr. Dietrich Pates we are following for ongoing assessment and management of atrial fibrillation on chronic Coumadin therapy, episodic heart failure, with recent admission to the emergency room in the setting of epistaxis. At that time the patient is treated with an ice pack: Pressure in ER she was also treated with cocaine packing. INR was 3.67.  Allergies  Allergen Reactions  . Celexa [Citalopram Hydrobromide]   . Codeine Nausea And Vomiting    HALLUCINATIONS    No current facility-administered medications for this visit.   No current outpatient prescriptions on file.   Facility-Administered Medications Ordered in Other Visits  Medication Dose Route Frequency Provider Last Rate Last Dose  . 0.9 % NaCl with KCl 20 mEq/ L  infusion   Intravenous Continuous Acey Lav, MD 10 mL/hr at 06/30/13 0257 10 mL/hr at 06/30/13 0257  . acetaminophen (TYLENOL) tablet 650 mg  650 mg Oral Q6H PRN Avon Gully, MD      . atorvastatin (LIPITOR) tablet 20 mg  20 mg Oral QHS [name], MD   20 mg at 06/30/13 2114  . Chlorhexidine Gluconate Cloth 2 % PADS 6 each  6 each Topical Q0600 Acey Lav, MD   6 each at 07/01/13 0507  . diltiazem (CARDIZEM) tablet 30 mg  30 mg Oral Q6H Nimish C Gosrani, MD      . ferrous sulfate tablet 325 mg  325 mg Oral Q breakfast Acey Lav, MD   325 mg at 06/30/13 0757  . furosemide (LASIX) tablet 40 mg  40 mg Oral Daily Nimish C Gosrani, MD      . insulin aspart (novoLOG) injection 0-15 Units  0-15 Units Subcutaneous TID WC Acey Lav, MD   5 Units at 06/30/13 1652  . insulin glargine (LANTUS) injection 30 Units  30 Units Subcutaneous QHS Nimish C Gosrani, MD      . levETIRAcetam (KEPPRA) tablet 250 mg  250 mg Oral BID Acey Lav, MD   250 mg at 06/30/13 2114  . levofloxacin (LEVAQUIN) IVPB 750 mg  750 mg Intravenous Q24H [name], MD   750 mg at 06/30/13 2013    . levothyroxine (SYNTHROID, LEVOTHROID) tablet 137 mcg  137 mcg Oral QAC breakfast Acey Lav, MD   137 mcg at 06/30/13 1031  . loperamide (IMODIUM) capsule 2 mg  2 mg Oral QODAY Acey Lav, MD   2 mg at 06/30/13 1032  . LORazepam (ATIVAN) tablet 1 mg  1 mg Oral TID PRN Acey Lav, MD   1 mg at 06/30/13 2238  . metoCLOPramide (REGLAN) tablet 10 mg  10 mg Oral TID AC & HS Acey Lav, MD   10 mg at 06/30/13 2114  . metoprolol (LOPRESSOR) tablet 100 mg  100 mg Oral BID Acey Lav, MD   100 mg at 06/30/13 2114  . mupirocin ointment (BACTROBAN) 2 % 1 application  1 application Nasal BID Acey Lav, MD   1 application at 06/30/13 2114  . nitroGLYCERIN (NITROSTAT) SL tablet 0.4 mg  0.4 mg Sublingual Q5 min PRN Acey Lav, MD      . ondansetron [facility]) tablet 8 mg  8 mg Oral Q8H PRN Acey Lav, MD   8 mg at 06/30/13 1423  . pantoprazole (PROTONIX) EC tablet 40 mg  40 mg Oral Daily Acey Lav, MD   40 mg at 06/30/13  1031  . potassium chloride SA (K-DUR,KLOR-CON) CR tablet 40 mEq  40 mEq Oral Once Wilson Singer, MD      . Warfarin - Pharmacist Dosing Inpatient   Does not apply Q24H [name], MD        Past Medical History  Diagnosis Date  . Chest pain     Negative cardiac catheterization in 2002; negative stress nuclear study in 2008  . Congestive heart failure     Normal LV systolic function  . PAF (paroxysmal atrial fibrillation) 2009  . Chronic anticoagulation   . Diabetes mellitus, type 2     Insulin therapy; exacerbated by prednisone  . Syncope     Admitted 05/2009; magnetic resonance imagin/ MRA - negative; etiology thought to be orthostasis secondary to drugs and dehydration  . GERD (gastroesophageal reflux disease)   . IBS (irritable bowel syndrome)   . Hiatal hernia   . Gastroparesis     99% retention 05/2008 on GES  . Hyperlipidemia   . Hypertension   . Hypothyroid   . Chronic LBP     Surgical intervention in 1996  . Obesity   .  OSA on CPAP   . Anemia     H/H of 10/30 with a normal MCV in 12/09  . Barrett's esophagus     Diagnosed 1995. Last EGD 2008.   . Seizures     Past Surgical History  Procedure Laterality Date  . Cardiovascular stress test  2008    Stress nuclear study  . Back surgery  1996  . Laparoscopic cholecystectomy  1990s  . Total abdominal hysterectomy  1999  . Laminectomy  1995    L4-L5  . Carpal tunnel release  1994  . Esophagogastroduodenoscopy  2008    Barrett's without dysplasia. esphagus dilated. antral erosions, h.pylori serologies negative.  . Colonoscopy  2013    Patient reports normal TCS around 10 years ago; and a repeat study in 2013 that was negative  . Givens capsule study  12/07/2011    Procedure: GIVENS CAPSULE STUDY;  Surgeon: West Bali, MD;  Location: [facility];  Service: Endoscopy;  Laterality: N/A;  . Cardiac catheterization  2002    ROS: PHYSICAL EXAM There were no vitals taken for this visit.  EKG:  ASSESSMENT AND PLAN

## 2013-07-01 NOTE — Progress Notes (Signed)
Patient refused a CPAP for tonight.

## 2013-07-01 NOTE — Care Management Note (Signed)
    Page 1 of 1   07/01/2013     3:20:18 PM   CARE MANAGEMENT NOTE 07/01/2013  Patient:  [name], [name]   Account Number:  [id number]  Date Initiated:  07/01/2013  Documentation initiated by:  Sharrie Rothman  Subjective/Objective Assessment:   Pt admitted from home with a fib and pneumonia. Pt lives at home with her husband and daughter. Pt has been independent with ADL's prior to admission. Pt does have a walker for prn use.     Action/Plan:   No CM needs noted at this time.   Anticipated DC Date:  07/04/2013   Anticipated DC Plan:  HOME/SELF CARE      DC Planning Services  CM consult      Choice offered to / List presented to:             Status of service:  Completed, signed off Medicare Important Message given?   (If response is "NO", the following Medicare IM given date fields will be blank) Date Medicare IM given:   Date Additional Medicare IM given:    Discharge Disposition:  HOME/SELF CARE  Per UR Regulation:    If discussed at Long Length of Stay Meetings, dates discussed:    Comments:  07/01/13 1520 Arlyss Queen, RN BSN CM

## 2013-07-01 NOTE — Progress Notes (Signed)
[name] AVW:[id number] DOB: 1950-08-04 DOA: 06/29/2013 PCP: [name], MD   Subjective: This lady is doing better. Her history is one of an infective process, she describes cough associated with yellow sputum, dyspnea and wheezing. Initial investigations did not show convincing evidence of congestive heart failure. She was in atrial fibrillation with rapid ventricular response, Cardizem drip has helped this. Serial cardiac enzymes are negative. Echocardiogram does not show decrease in ejection fraction, it is still 45-50%.           Physical Exam: Blood pressure 133/87, pulse 46, temperature 98.3 F (36.8 C), temperature source Oral, resp. rate 20, height 5\' 8"  (1.727 m), weight 101.8 kg (224 lb 6.9 oz), SpO2 93.00%. She looks systemically well. She is nontoxic or septic clinically. Heart sounds are present in atrial fibrillation with a ventricular rate of 80-90 when I examined her. She is hemodynamically stable. Lung fields are entirely clear with reduced air entry in the left lower zone. There is no bronchial breathing, wheezing or crackles. She is clinically not in heart failure. She is alert and orientated.   Investigations:  Recent Results (from the past 240 hour(s))  MRSA PCR SCREENING     Status: Abnormal   Collection Time    06/30/13  3:00 AM      Result Value Range Status   MRSA by PCR POSITIVE (*) NEGATIVE Final   Comment: RESULT CALLED TO, READ BACK BY AND VERIFIED WITH:     SMITH J. AT [id number] ON [id number] BY THOMPSON S.                The GeneXpert MRSA Assay (FDA     approved for NASAL specimens     only), is one component of a     comprehensive MRSA colonization     surveillance program. It is not     intended to diagnose MRSA     infection nor to guide or     monitor treatment for     MRSA infections.  CULTURE, BLOOD (ROUTINE X 2)     Status: None   Collection Time    06/30/13  3:18 AM      Result Value Range Status   Specimen Description Blood  RIGHT ANTECUBITAL   Final   Special Requests BOTTLES DRAWN AEROBIC AND ANAEROBIC 6CC   Final   Culture NO GROWTH <24 HRS   Final   Report Status PENDING   Incomplete  CULTURE, BLOOD (ROUTINE X 2)     Status: None   Collection Time    06/30/13  3:28 AM      Result Value Range Status   Specimen Description Blood BLOOD RIGHT HAND   Final   Special Requests BOTTLES DRAWN AEROBIC AND ANAEROBIC 5CC   Final   Culture NO GROWTH <24 HRS   Final   Report Status PENDING   Incomplete     Basic Metabolic Panel:  Recent Labs  57/84/69 1718 06/30/13 0318  NA 136 139  K 3.1* 3.3*  CL 98 99  CO2 24 25  GLUCOSE 279* 332*  BUN 9 9  CREATININE 0.57 0.57  CALCIUM 8.2* 8.5  MG  --  1.1*  PHOS  --  3.4   Liver Function Tests:  Recent Labs  06/29/13 1718  AST 13  ALT 8  ALKPHOS 56  BILITOT 0.5  PROT 6.9  ALBUMIN 3.6     CBC:  Recent Labs  06/29/13 1718 06/30/13 0318  WBC 10.9* 9.8  NEUTROABS 7.4 6.9  HGB 10.7* 10.4*  HCT 34.8* 33.8*  MCV 89.7 89.7  PLT 272 273    Ct Angio Chest Pe W/cm &/or Wo Cm  06/29/2013   CLINICAL DATA:  Shortness of breath, weakness and chest pain. History of smoking.  EXAM: CT ANGIOGRAPHY CHEST WITH CONTRAST  TECHNIQUE: Multidetector CT imaging of the chest was performed using the standard protocol during bolus administration of intravenous contrast. Multiplanar CT image reconstructions including MIPs were obtained to evaluate the vascular anatomy.  CONTRAST:  OMNIPAQUE IOHEXOL 350 MG/ML SOLN  COMPARISON:  Chest radiograph performed earlier today at 05:12 p.m., and CTA of the chest performed 10/16/2006  FINDINGS: There is no evidence of pulmonary embolus.  Patchy airspace opacity is noted within the left lung, and minimal right-sided airspace opacity is seen, with underlying interstitial prominence. The appearance is more suggestive of atelectasis, though mild pneumonia might have such an appearance. There is no evidence of pleural effusion or  pneumothorax. No masses are identified; no abnormal focal contrast enhancement is seen.  There is bilateral prominence of the pulmonary arteries, with tapering seen distally, raising concern for mild pulmonary arterial hypertension. Mild biatrial enlargement is noted. Calcification is seen at the mitral valve. The mediastinum is otherwise unremarkable in appearance. No mediastinal lymphadenopathy is seen. The great vessel grossly unremarkable. No pericardial effusion is identified. No axillary lymphadenopathy is seen. The thyroid gland is not visualized.  There is reflux of contrast into the IVC and hepatic veins. The visualized portions of the spleen are unremarkable. There is mild diffuse fatty infiltration involving the pancreas.  No acute osseous abnormalities are seen.  Review of the MIP images confirms the above findings.  IMPRESSION: 1. No evidence of pulmonary embolus. 2. Patchy airspace opacity within the left lung, and minimal right-sided airspace opacity, with underlying interstitial prominence. The appearance is more suggestive of atelectasis, though mild pneumonia might have such an appearance. 3. Prominence of the pulmonary arteries, with apparent distal tapering, raising concern for mild pulmonary arterial hypertension. Mild biatrial enlargement noted. Calcification seen at the mitral valve. 4. Reflux of contrast incidentally noted into the IVC and hepatic veins.   Electronically Signed   By: Roanna Raider M.D.   On: 06/29/2013 21:18   Dg Chest Portable 1 View  06/29/2013   CLINICAL DATA:  Feels like heart is ray seen.  EXAM: PORTABLE CHEST - 1 VIEW  COMPARISON:  06/15/2013  FINDINGS: There is moderate enlargement of the cardiopericardial silhouette. The mediastinum is normal in contour. No convincing hilar masses.  There is opacity that extends laterally from the left hilum, likely atelectasis. Infiltrate is not excluded.  Prominent bronchovascular markings are noted bilaterally which are  stable. No pulmonary edema. No pleural effusion or pneumothorax is seen.  The bony thorax is demineralized but grossly intact.  IMPRESSION: No convincing acute cardiopulmonary disease. Left mid lung opacity is likely atelectasis. Moderate cardiomegaly.   Electronically Signed   By: Amie Portland M.D.   On: 06/29/2013 17:27      Medications: I have reviewed the patient's current medications.  Impression: 1. Bronchitis with probable underlying pneumonia/atelectasis, improving. 2. Atrial fibrillation with rapid ventricular response, improving. On chronic anticoagulation. 3. Hypertension, controlled. 4. Hypothyroidism, TSH an acceptable range. 5. Anemia of iron deficiency, can be investigated as an outpatient. 6. Obesity. 7. Hypokalemia. 8. Uncontrolled type 2 diabetes mellitus.     Plan: 1. Discontinue IV Lasix and start oral Lasix. 2. Discontinue IV Cardizem drip and start on  short acting oral Cardizem for today. May need to convert to long-acting Cardizem tomorrow depending on ventricular rate. 3. Restart Lantus insulin at night as her diabetes is not well controlled. 4. Patient can move to telemetry floor.  Consultants:  None.   Procedures: Echocardiogram. ------------------------------------------------------------ Study Conclusions  - Left ventricle: The cavity size was normal. Wall thickness was increased in a pattern of moderate LVH. Systolic function was mildly reduced. The estimated ejection fraction was in the range of 45% to 50%. Diffuse hypokinesis. Doppler parameters are consistent with high ventricular filling pressure. - Mitral valve: Calcified annulus. Mildly thickened leaflets . Mild regurgitation. - Left atrium: The atrium was moderately dilated. - Right ventricle: Systolic function was mildly reduced. - Right atrium: The atrium was moderately dilated. - Atrial septum: The septum bowed from left to right, consistent with increased left atrial pressure. -  Pulmonary arteries: Systolic pressure was moderately increased. PA peak pressure: 54mm Hg (S). Transthoracic echocardiography. M-mode, complete 2D, spectral Doppler, and color Doppler. Height: Height: 172.7cm. Height: 68in. Weight: Weight: 108.1kg. Weight: 237.8lb. Body mass index: BMI: 36.2kg/m^2. Body surface area: BSA: 2.27m^2. Patient status: Inpatient. Location: Bedside.  ------------------------------------------------------------  Antibiotics:  Intravenous Levaquin.                   Code Status: Full code.  Family Communication: Discussed plan with patient at the bedside.   Disposition Plan: home when medically stable.  Time spent: 20 minutes.   LOS: 2 days   Wilson Singer Pager [phone]  07/01/2013, 8:07 AM

## 2013-07-01 NOTE — Progress Notes (Signed)
ANTICOAGULATION CONSULT NOTE  Pharmacy Consult for Warfarin Indication: atrial fibrillation  Allergies  Allergen Reactions  . Celexa [Citalopram Hydrobromide]   . Codeine Nausea And Vomiting    HALLUCINATIONS    Patient Measurements: Height: 5\' 8"  (172.7 cm) Weight: 224 lb 6.9 oz (101.8 kg) IBW/kg (Calculated) : 63.9 Heparin Dosing Weight:   Vital Signs: Temp: 98.3 F (36.8 C) (12/29 0730) Temp src: Oral (12/29 0730) BP: 124/90 mmHg (12/29 1037) Pulse Rate: 89 (12/29 1037)  Labs:  Recent Labs  06/29/13 1718 06/30/13 0318 06/30/13 0329 07/01/13 0505  HGB 10.7* 10.4*  --   --   HCT 34.8* 33.8*  --   --   PLT 272 273  --   --   LABPROT 19.4*  --  20.7* 22.0*  INR 1.69*  --  1.84* 1.99*  CREATININE 0.57 0.57  --   --   TROPONINI <0.30  --   --   --     Estimated Creatinine Clearance: 88.7 ml/min (by C-G formula based on Cr of 0.57).   Medical History: Past Medical History  Diagnosis Date  . Chest pain     Negative cardiac catheterization in 2002; negative stress nuclear study in 2008  . Congestive heart failure     Normal LV systolic function  . PAF (paroxysmal atrial fibrillation) 2009  . Chronic anticoagulation   . Diabetes mellitus, type 2     Insulin therapy; exacerbated by prednisone  . Syncope     Admitted 05/2009; magnetic resonance imagin/ MRA - negative; etiology thought to be orthostasis secondary to drugs and dehydration  . GERD (gastroesophageal reflux disease)   . IBS (irritable bowel syndrome)   . Hiatal hernia   . Gastroparesis     99% retention 05/2008 on GES  . Hyperlipidemia   . Hypertension   . Hypothyroid   . Chronic LBP     Surgical intervention in 1996  . Obesity   . OSA on CPAP   . Anemia     H/H of 10/30 with a normal MCV in 12/09  . Barrett's esophagus     Diagnosed 1995. Last EGD 2008.   . Seizures     Medications:  Scheduled:  . atorvastatin  20 mg Oral QHS  . Chlorhexidine Gluconate Cloth  6 each Topical Q0600  .  diltiazem  30 mg Oral Q6H  . ferrous sulfate  325 mg Oral Q breakfast  . furosemide  40 mg Oral Daily  . insulin aspart  0-15 Units Subcutaneous TID WC  . insulin glargine  30 Units Subcutaneous QHS  . levETIRAcetam  250 mg Oral BID  . levofloxacin (LEVAQUIN) IV  750 mg Intravenous Q24H  . levothyroxine  137 mcg Oral QAC breakfast  . loperamide  2 mg Oral QODAY  . metoCLOPramide  10 mg Oral TID AC & HS  . metoprolol  100 mg Oral BID  . mupirocin ointment  1 application Nasal BID  . pantoprazole  40 mg Oral Daily  . Warfarin - Pharmacist Dosing Inpatient   Does not apply Q24H    Assessment: 62 yo F on chronic warfarin for atrial fibrillation. PTA Warfarin dose is 1.25 mg po daily except 2.5mg  on Fri.  Patient developed nose bleeds in past week & INR > 6 when presented to Coumadin clinic on 12/22.  Coumadin was held x3 days & INR was sub-therapeutic on admission. Elevated INR most likely due to her being on Levaquin.   Bleeding has resolved & INR  rising to goal.   Goal of Therapy:  INR 2-3   Plan:  Warfarin 1.25 mg po today (home dose) INR/PT daily  [name] 07/01/2013,11:07 AM

## 2013-07-01 NOTE — Progress Notes (Signed)
Inpatient Diabetes Program Recommendations  AACE/ADA: New Consensus Statement on Inpatient Glycemic Control (2013)  Target Ranges:  Prepandial:   less than 140 mg/dL      Peak postprandial:   less than 180 mg/dL (1-2 hours)      Critically ill patients:  140 - 180 mg/dL   Results for [name], [name] (MRN [id number]) as of 07/01/2013 08:12  Ref. Range 06/30/2013 07:40 06/30/2013 11:46 06/30/2013 16:50 06/30/2013 21:19 07/01/2013 07:24  Glucose-Capillary Latest Range: 70-99 mg/dL 253 (H) 664 (H) 403 (H) 260 (H) 309 (H)    Inpatient Diabetes Program Recommendations Insulin - Basal: Please consider changing frequency of Lantus to daily so patient will receive this morning. Correction (SSI): Please consider increasing Novolog correction to resistant scale and adding Novolog bedtime correction.  Note: Patient has a history of diabetes and according to the home medication list she takes Glipizide 10 mg BID, Lantus 50 units QHS, and Metformin 1000 mg BID as an outpatient for diabetes management.  Currently, patient is ordered to receive Novolog 0-15 units AC and Lantus 30 units QHS (ordered today but not scheduled to receive until bedtime tonight) for inpatient glycemic control.  Blood glucose ranged from 201-280 mg/dl on 47/42 and fasting glucose this morning is 309 mg/dl.  Please consider ordering Lantus daily (so it can be given this morning), increasing Novolog correction to resistant scale, and adding Novolog bedtime correction scale.  Will continue to follow.  Thanks, Orlando Penner, RN, MSN, CCRN Diabetes Coordinator Inpatient Diabetes Program [phone] (Team Pager) [phone] (AP office) [phone] [facility] office)

## 2013-07-01 NOTE — Progress Notes (Signed)
UR chart review completed.  

## 2013-07-02 LAB — COMPREHENSIVE METABOLIC PANEL
AST: 11 U/L (ref 0–37)
Albumin: 3 g/dL — ABNORMAL LOW (ref 3.5–5.2)
Alkaline Phosphatase: 51 U/L (ref 39–117)
CO2: 31 mEq/L (ref 19–32)
Chloride: 97 mEq/L (ref 96–112)
Creatinine, Ser: 0.66 mg/dL (ref 0.50–1.10)
GFR calc non Af Amer: >90 mL/min (ref >90)
Glucose, Bld: 294 mg/dL — ABNORMAL HIGH (ref 70–99)
Potassium: 2.8 mEq/L — CL (ref 3.7–5.3)
Total Bilirubin: 0.6 mg/dL (ref 0.3–1.2)

## 2013-07-02 LAB — GLUCOSE, CAPILLARY
Glucose-Capillary: 266 mg/dL — ABNORMAL HIGH (ref 70–99)
Glucose-Capillary: 286 mg/dL — ABNORMAL HIGH (ref 70–99)

## 2013-07-02 LAB — PROTIME-INR
INR: 1.75 — ABNORMAL HIGH (ref 0.00–1.49)
Prothrombin Time: 19.9 seconds — ABNORMAL HIGH (ref 11.6–15.2)

## 2013-07-02 MED ORDER — WARFARIN SODIUM 2 MG PO TABS
3.0000 mg | ORAL_TABLET | Freq: Once | ORAL | Status: AC
  Administered 2013-07-02: 3 mg via ORAL
  Filled 2013-07-02 (×2): qty 1

## 2013-07-02 MED ORDER — POTASSIUM CHLORIDE CRYS ER 20 MEQ PO TBCR
20.0000 meq | EXTENDED_RELEASE_TABLET | Freq: Two times a day (BID) | ORAL | Status: DC
  Administered 2013-07-02 – 2013-07-04 (×5): 20 meq via ORAL
  Filled 2013-07-02 (×5): qty 1

## 2013-07-02 MED ORDER — POTASSIUM CHLORIDE 20 MEQ/15ML (10%) PO LIQD
40.0000 meq | Freq: Once | ORAL | Status: AC
  Administered 2013-07-02: 40 meq via ORAL
  Filled 2013-07-02: qty 30

## 2013-07-02 NOTE — Progress Notes (Signed)
ANTICOAGULATION CONSULT NOTE  Pharmacy Consult for Warfarin Indication: atrial fibrillation  Allergies  Allergen Reactions  . Celexa [Citalopram Hydrobromide]   . Codeine Nausea And Vomiting    HALLUCINATIONS    Patient Measurements: Height: 5\' 8"  (172.7 cm) Weight: 224 lb 6.9 oz (101.8 kg) IBW/kg (Calculated) : 63.9  Vital Signs: Temp: 98 F (36.7 C) (12/30 0646) Temp src: Oral (12/30 0646) BP: 130/80 mmHg (12/30 0646) Pulse Rate: 86 (12/30 0646)  Labs:  Recent Labs  06/29/13 1718 06/30/13 0318 06/30/13 0329 07/01/13 0505 07/02/13 0526  HGB 10.7* 10.4*  --   --   --   HCT 34.8* 33.8*  --   --   --   PLT 272 273  --   --   --   LABPROT 19.4*  --  20.7* 22.0* 19.9*  INR 1.69*  --  1.84* 1.99* 1.75*  CREATININE 0.57 0.57  --   --  0.66  TROPONINI <0.30  --   --   --   --     Estimated Creatinine Clearance: 88.7 ml/min (by C-G formula based on Cr of 0.66).   Medical History: Past Medical History  Diagnosis Date  . Chest pain     Negative cardiac catheterization in 2002; negative stress nuclear study in 2008  . Congestive heart failure     Normal LV systolic function  . PAF (paroxysmal atrial fibrillation) 2009  . Chronic anticoagulation   . Diabetes mellitus, type 2     Insulin therapy; exacerbated by prednisone  . Syncope     Admitted 05/2009; magnetic resonance imagin/ MRA - negative; etiology thought to be orthostasis secondary to drugs and dehydration  . GERD (gastroesophageal reflux disease)   . IBS (irritable bowel syndrome)   . Hiatal hernia   . Gastroparesis     99% retention 05/2008 on GES  . Hyperlipidemia   . Hypertension   . Hypothyroid   . Chronic LBP     Surgical intervention in 1996  . Obesity   . OSA on CPAP   . Anemia     H/H of 10/30 with a normal MCV in 12/09  . Barrett's esophagus     Diagnosed 1995. Last EGD 2008.   . Seizures     Medications:  Scheduled:  . atorvastatin  20 mg Oral QHS  . Chlorhexidine Gluconate Cloth   6 each Topical Q0600  . diltiazem  30 mg Oral Q6H  . ferrous sulfate  325 mg Oral Q breakfast  . furosemide  40 mg Oral Daily  . insulin aspart  0-15 Units Subcutaneous TID WC  . insulin glargine  30 Units Subcutaneous QHS  . levETIRAcetam  250 mg Oral BID  . levofloxacin (LEVAQUIN) IV  750 mg Intravenous Q24H  . levothyroxine  137 mcg Oral QAC breakfast  . loperamide  2 mg Oral QODAY  . metoCLOPramide  10 mg Oral TID AC & HS  . metoprolol  100 mg Oral BID  . mupirocin ointment  1 application Nasal BID  . pantoprazole  40 mg Oral Daily  . potassium chloride  40 mEq Oral Once  . potassium chloride  20 mEq Oral BID  . Warfarin - Pharmacist Dosing Inpatient   Does not apply Q24H    Assessment: 62 yo F on chronic warfarin for atrial fibrillation. PTA Warfarin dose is 1.25 mg po daily except 2.5mg  on Fri.  Patient developed nose bleeds in past week & INR > 6 when presented to Coumadin clinic  on 12/22.  Coumadin was held x3 days & INR was sub-therapeutic on admission. Elevated INR most likely due to her being on Levaquin.   Bleeding has resolved.  INR sub-therapeutic & trending down.  All doses charted as given.   Goal of Therapy:  INR 2-3   Plan:  Warfarin 3 mg po today  INR daily  Elson Clan 07/02/2013,8:49 AM

## 2013-07-02 NOTE — Progress Notes (Signed)
Patient still refusing CPAP. 

## 2013-07-02 NOTE — Progress Notes (Signed)
CRITICAL VALUE ALERT  Critical value received:  K+ 2.8  Date of notification:  07/02/2013  Time of notification:  06:55  Critical value read back:yes  Nurse who received alert:  Blair Heys  MD notified (1st page):  Dr. [name]   Time of first page:  07:10  MD notified (2nd page):--  Time of second page:--  Responding MD:  Dr. [name]  Time MD responded:  07:10

## 2013-07-02 NOTE — Progress Notes (Signed)
Inpatient Diabetes Program Recommendations  AACE/ADA: New Consensus Statement on Inpatient Glycemic Control (2013)  Target Ranges:  Prepandial:   less than 140 mg/dL      Peak postprandial:   less than 180 mg/dL (1-2 hours)      Critically ill patients:  140 - 180 mg/dL   Results for [name], [name] (MRN [id number]) as of 07/02/2013 09:07  Ref. Range 07/01/2013 07:24 07/01/2013 11:20 07/01/2013 17:25 07/01/2013 21:09 07/02/2013 08:04  Glucose-Capillary Latest Range: 70-99 mg/dL 409 (H) 811 (H) 914 (H) 392 (H) 266 (H)   Inpatient Diabetes Program Recommendations Insulin - Basal: Please consider increasing Lantus to 40 units QHS. Correction (SSI): Please consider increasing Novolog correction to resistant scale and adding Novolog bedtime correction.  Thanks, Orlando Penner, RN, MSN, CCRN Diabetes Coordinator Inpatient Diabetes Program [phone] (Team Pager) [phone] (AP office) [phone] [facility] office)

## 2013-07-03 LAB — GLUCOSE, CAPILLARY
Glucose-Capillary: 283 mg/dL — ABNORMAL HIGH (ref 70–99)
Glucose-Capillary: 308 mg/dL — ABNORMAL HIGH (ref 70–99)
Glucose-Capillary: 276 mg/dL — ABNORMAL HIGH (ref 70–99)

## 2013-07-03 LAB — PROTIME-INR: INR: 1.6 — ABNORMAL HIGH (ref 0.00–1.49)

## 2013-07-03 MED ORDER — DILTIAZEM HCL ER COATED BEADS 180 MG PO CP24
180.0000 mg | ORAL_CAPSULE | Freq: Every day | ORAL | Status: DC
  Administered 2013-07-03 – 2013-07-04 (×2): 180 mg via ORAL
  Filled 2013-07-03 (×2): qty 1

## 2013-07-03 MED ORDER — LEVOFLOXACIN 750 MG PO TABS
750.0000 mg | ORAL_TABLET | Freq: Every day | ORAL | Status: DC
  Administered 2013-07-03: 750 mg via ORAL
  Filled 2013-07-03: qty 1

## 2013-07-03 MED ORDER — WARFARIN SODIUM 2 MG PO TABS
3.0000 mg | ORAL_TABLET | Freq: Once | ORAL | Status: AC
  Administered 2013-07-03: 17:00:00 3 mg via ORAL
  Filled 2013-07-03 (×2): qty 1

## 2013-07-03 MED ORDER — INSULIN GLARGINE 100 UNIT/ML ~~LOC~~ SOLN
40.0000 [IU] | Freq: Every day | SUBCUTANEOUS | Status: DC
  Administered 2013-07-03: 40 [IU] via SUBCUTANEOUS
  Filled 2013-07-03 (×2): qty 0.4

## 2013-07-03 NOTE — Progress Notes (Signed)
ANTICOAGULATION CONSULT NOTE  Pharmacy Consult for Warfarin Indication: atrial fibrillation  Allergies  Allergen Reactions  . Celexa [Citalopram Hydrobromide]   . Codeine Nausea And Vomiting    HALLUCINATIONS   Patient Measurements: Height: 5\' 8"  (172.7 cm) Weight: 224 lb 6.9 oz (101.8 kg) IBW/kg (Calculated) : 63.9  Vital Signs: Temp: 97.9 F (36.6 C) (12/31 0600) Temp src: Oral (12/31 0600) BP: 142/75 mmHg (12/31 0600) Pulse Rate: 72 (12/31 0600)  Labs:  Recent Labs  07/01/13 0505 07/02/13 0526 07/03/13 0509  LABPROT 22.0* 19.9* 18.6*  INR 1.99* 1.75* 1.60*  CREATININE  --  0.66  --    Estimated Creatinine Clearance: 88.7 ml/min (by C-G formula based on Cr of 0.66).  Medical History: Past Medical History  Diagnosis Date  . Chest pain     Negative cardiac catheterization in 2002; negative stress nuclear study in 2008  . Congestive heart failure     Normal LV systolic function  . PAF (paroxysmal atrial fibrillation) 2009  . Chronic anticoagulation   . Diabetes mellitus, type 2     Insulin therapy; exacerbated by prednisone  . Syncope     Admitted 05/2009; magnetic resonance imagin/ MRA - negative; etiology thought to be orthostasis secondary to drugs and dehydration  . GERD (gastroesophageal reflux disease)   . IBS (irritable bowel syndrome)   . Hiatal hernia   . Gastroparesis     99% retention 05/2008 on GES  . Hyperlipidemia   . Hypertension   . Hypothyroid   . Chronic LBP     Surgical intervention in 1996  . Obesity   . OSA on CPAP   . Anemia     H/H of 10/30 with a normal MCV in 12/09  . Barrett's esophagus     Diagnosed 1995. Last EGD 2008.   . Seizures    Medications:  Scheduled:  . atorvastatin  20 mg Oral QHS  . Chlorhexidine Gluconate Cloth  6 each Topical Q0600  . diltiazem  180 mg Oral Daily  . ferrous sulfate  325 mg Oral Q breakfast  . furosemide  40 mg Oral Daily  . insulin aspart  0-15 Units Subcutaneous TID WC  . insulin  glargine  40 Units Subcutaneous QHS  . levETIRAcetam  250 mg Oral BID  . levofloxacin  750 mg Oral q1800  . levothyroxine  137 mcg Oral QAC breakfast  . loperamide  2 mg Oral QODAY  . metoCLOPramide  10 mg Oral TID AC & HS  . metoprolol  100 mg Oral BID  . mupirocin ointment  1 application Nasal BID  . pantoprazole  40 mg Oral Daily  . potassium chloride  20 mEq Oral BID  . Warfarin - Pharmacist Dosing Inpatient   Does not apply Q24H   Assessment: 62 yo F on chronic warfarin for atrial fibrillation. PTA Warfarin dose is 1.25 mg po daily except 2.5mg  on Fri.  Patient developed nose bleeds in past week & INR > 6 when presented to Coumadin clinic on 12/22.  Coumadin was held x3 days & INR was sub-therapeutic on admission. Elevated INR most likely due to her being on Levaquin.   Bleeding has resolved.  INR sub-therapeutic & trending down.  All doses charted as given.   Goal of Therapy:  INR 2-3   Plan:  Warfarin 3 mg po today  INR daily  Valrie Hart A 07/03/2013,9:01 AM

## 2013-07-03 NOTE — Progress Notes (Signed)
ANTIBIOTIC CONSULT NOTE - follow up  Pharmacy Consult for Levaquin Indication: pneumonia  Allergies  Allergen Reactions  . Celexa [Citalopram Hydrobromide]   . Codeine Nausea And Vomiting    HALLUCINATIONS   Patient Measurements: Height: 5\' 8"  (172.7 cm) Weight: 224 lb 6.9 oz (101.8 kg) IBW/kg (Calculated) : 63.9  Vital Signs: Temp: 97.9 F (36.6 C) (12/31 0600) Temp src: Oral (12/31 0600) BP: 142/75 mmHg (12/31 0600) Pulse Rate: 72 (12/31 0600) Intake/Output from previous day: 12/30 0701 - 12/31 0700 In: 720 [P.O.:720] Out: -  Intake/Output from this shift:    Labs:  Recent Labs  07/02/13 0526  CREATININE 0.66   Estimated Creatinine Clearance: 88.7 ml/min (by C-G formula based on Cr of 0.66). No results found for this basename: VANCOTROUGH, Leodis Binet, VANCORANDOM, GENTTROUGH, GENTPEAK, GENTRANDOM, TOBRATROUGH, TOBRAPEAK, TOBRARND, AMIKACINPEAK, AMIKACINTROU, AMIKACIN,  in the last 72 hours   Microbiology: Recent Results (from the past 720 hour(s))  MRSA PCR SCREENING     Status: Abnormal   Collection Time    06/30/13  3:00 AM      Result Value Range Status   MRSA by PCR POSITIVE (*) NEGATIVE Final   Comment: RESULT CALLED TO, READ BACK BY AND VERIFIED WITH:     SMITH J. AT 1610R ON [id number] BY THOMPSON S.                The GeneXpert MRSA Assay (FDA     approved for NASAL specimens     only), is one component of a     comprehensive MRSA colonization     surveillance program. It is not     intended to diagnose MRSA     infection nor to guide or     monitor treatment for     MRSA infections.  CULTURE, BLOOD (ROUTINE X 2)     Status: None   Collection Time    06/30/13  3:18 AM      Result Value Range Status   Specimen Description BLOOD RIGHT ANTECUBITAL   Final   Special Requests BOTTLES DRAWN AEROBIC AND ANAEROBIC 6CC   Final   Culture NO GROWTH 2 DAYS   Final   Report Status PENDING   Incomplete  CULTURE, BLOOD (ROUTINE X 2)     Status: None    Collection Time    06/30/13  3:28 AM      Result Value Range Status   Specimen Description BLOOD RIGHT HAND   Final   Special Requests BOTTLES DRAWN AEROBIC AND ANAEROBIC 5CC   Final   Culture NO GROWTH 2 DAYS   Final   Report Status PENDING   Incomplete   Medical History: Past Medical History  Diagnosis Date  . Chest pain     Negative cardiac catheterization in 2002; negative stress nuclear study in 2008  . Congestive heart failure     Normal LV systolic function  . PAF (paroxysmal atrial fibrillation) 2009  . Chronic anticoagulation   . Diabetes mellitus, type 2     Insulin therapy; exacerbated by prednisone  . Syncope     Admitted 05/2009; magnetic resonance imagin/ MRA - negative; etiology thought to be orthostasis secondary to drugs and dehydration  . GERD (gastroesophageal reflux disease)   . IBS (irritable bowel syndrome)   . Hiatal hernia   . Gastroparesis     99% retention 05/2008 on GES  . Hyperlipidemia   . Hypertension   . Hypothyroid   . Chronic LBP  Surgical intervention in 1996  . Obesity   . OSA on CPAP   . Anemia     H/H of 10/30 with a normal MCV in 12/09  . Barrett's esophagus     Diagnosed 1995. Last EGD 2008.   . Seizures    Medications:  Scheduled:  . atorvastatin  20 mg Oral QHS  . Chlorhexidine Gluconate Cloth  6 each Topical Q0600  . diltiazem  180 mg Oral Daily  . ferrous sulfate  325 mg Oral Q breakfast  . furosemide  40 mg Oral Daily  . insulin aspart  0-15 Units Subcutaneous TID WC  . insulin glargine  40 Units Subcutaneous QHS  . levETIRAcetam  250 mg Oral BID  . levofloxacin  750 mg Oral q1800  . levothyroxine  137 mcg Oral QAC breakfast  . loperamide  2 mg Oral QODAY  . metoCLOPramide  10 mg Oral TID AC & HS  . metoprolol  100 mg Oral BID  . mupirocin ointment  1 application Nasal BID  . pantoprazole  40 mg Oral Daily  . potassium chloride  20 mEq Oral BID  . Warfarin - Pharmacist Dosing Inpatient   Does not apply Q24H    Assessment: Levaquin as outpatient for bronchitis. Patient confused with directions and took incorrectly Admitted for shortness of breath, pneumonia Pt is afebrile and tolerating po medications.  Goal of Therapy:  Eradicate infection  RECOMMENDATION: This patient is receiving Levaquin by the intravenous route.  Based on criteria approved by the Pharmacy and Therapeutics Committee, the antibiotic(s) is/are being converted to the equivalent oral dose form(s).  DESCRIPTION: These criteria include:  Patient being treated for a respiratory tract infection, urinary tract infection, cellulitis or clostridium difficile associated diarrhea if on metronidazole  The patient is not neutropenic and does not exhibit a GI malabsorption state  The patient is eating (either orally or via tube) and/or has been taking other orally administered medications for a least 24 hours  The patient is improving clinically and has a Tmax < 100.5  If you have questions about this conversion, please contact the Pharmacy Department  [x]   [phone] )  [facility] []   [phone] )  [facility]  []   [phone] )  [facility] []   [phone] )  [facility], [place] [facility] 07/03/2013,8:57 AM

## 2013-07-03 NOTE — Progress Notes (Signed)
[name], [name]                 ACCOUNT NO.:  [id number]  MEDICAL RECORD NO.:  [id number]  LOCATION:  A321                          FACILITY:  [facility]  PHYSICIAN:  [name], M.D.DATE OF BIRTH:  12/24/1950  DATE OF PROCEDURE: DATE OF DISCHARGE:                                PROGRESS NOTE   SUBJECTIVE:  She still feels rather weak.  She was admitted to the hospital with atrial fibrillation with rapid ventricular response and resultant congestive heart failure.  She was in the unit and she was on IV diltiazem, now on p.o. diltiazem and p.o. furosemide rather than IV furosemide.  OBJECTIVE:  GENERAL:  She is supine in bed.  Her daughter is with her. She talks in very halting fashion and she has somewhat halting speech. HEART:  Appears to have a fairly regular rhythm today at about 90. LUNGS:  Appear clear throughout. VITAL SIGNS:  Temperature 98 degrees, pulse 86, respiratory rate 20, blood pressure 130/80. ABDOMEN:  Soft without tenderness.  LABORATORY DATA:  Her potassium at admission was 3.1, which increased to 3.3 yesterday then 2.8 today.  Her glucose remains elevated at 294. Albumin is low at 3.0.  Iron studies showed total iron 34, total iron- binding capacity 415, saturation of 8%, and ferritin level 18.  Folate was greater than 20, B12 701, and vitamin D was 35.  Her hemoglobin was 10.7 on admission, recheck 10.4, and INRs have been in the upper 1 range.  Her TSH was 2.021.  Hemoglobin A1c 10.6.  Her urine was negative for Legionella and for strep pneumoniae.  MRSA by PCR was positive and blood cultures x2 have been negative at 1 day.  Admission chest x-ray showed moderate cardiomegaly and prominent bronchovascular markings, which were stable.  There was atelectasis.  ASSESSMENT:  I believe that she has had a bacterial infection of the lung that has kicked off the atrial fibrillation with rapid ventricular response. 1. Congestive heart failure. 2. Atrial  fibrillation with rapid ventricular response. 3. Bronchitis/pneumonia. 4. Uncontrolled type 2 diabetes. 5. Chronic anemia. 6. Benign essential hypertension. 7. Hypothyroidism, stable. 8. Hypokalemia.  PLAN:  She is already in order for potassium chloride 40 mEq stat followed by 20 mEq b.i.d.  We will recheck her BMP and CBC tomorrow. Hopefully she will be able to get up and around today with help from physical therapy.  She is currently on diltiazem 30 mg q.6 hours and will continue that today and switch to long-acting diltiazem tomorrow.     [name], M.D.     SDK/MEDQ  D:  07/02/2013  T:  07/03/2013  Job:  [id number]

## 2013-07-03 NOTE — Evaluation (Signed)
Physical Therapy Evaluation Patient Details Name: [name] MRN: [id number] DOB: 05-21-1951 Today's Date: 07/03/2013 Time: 4098-1191 PT Time Calculation (min): 32 min  PT Assessment / Plan / Recommendation History of Present Illness    Ms. Arntz was evaluated in the emergency department today for shortness of breath and palpitations. A few weeks ago she felt like she had bronchitis and was treated with a Z-Pak. One week ago she saw her primary care physician because she didn't feel like a bronchitis was improving. She was started on oral Levaquin. She misunderstood the directions and took one pill every 4 hours instead of as directed. She feels that the cough associated with bronchitis has resolved. The last couple of days she has noted increased coughing as well as increased swelling of her lower extremities. She was much more short of breath than usual today and so came to the emergency room. The shortness of breath was associated with a feeling of her heart racing. They occurred when she was laying down on a couch.   Clinical Impression  PT states she feels 50% better. Pt states she is never alone at the house.  Pt appears to be at prior functional level.    PT Assessment  Patent does not need any further PT services    Follow Up Recommendations  No PT follow up    Does the patient have the potential to tolerate intense rehabilitation    N/A  Barriers to Discharge  none      Equipment Recommendations  None recommended by PT    Recommendations for Other Services   none     Precautions / Restrictions Precautions Precautions: None Restrictions Weight Bearing Restrictions: No   Pertinent Vitals/Pain None stated      Mobility  Bed Mobility Bed Mobility: Supine to Sit Supine to Sit: 7: Independent Transfers Transfers: Sit to Stand;Stand to Sit Sit to Stand: 7: Independent Stand to Sit: 7: Independent Ambulation/Gait Ambulation/Gait Assistance: 7:  Independent Ambulation Distance (Feet): 120 Feet Assistive device: None Gait Pattern: Within Functional Limits Gait velocity: slower than expected but both daughter and pt states it is normal for her      PT Goals(Current goals can be found in the care plan section) Acute Rehab PT Goals PT Goal Formulation: No goals set, d/c therapy  Visit Information  Last PT Received On: 07/03/13       Prior Functioning  Home Living Family/patient expects to be discharged to:: Private residence Living Arrangements: Spouse/significant other Available Help at Discharge: Family Type of Home: Mobile home Home Access: Stairs to enter Secretary/administrator of Steps: 3 Entrance Stairs-Rails: Right Home Equipment: None Prior Function Level of Independence: Independent Communication Communication: No difficulties Dominant Hand: Right    Cognition  Cognition Arousal/Alertness: Awake/alert Overall Cognitive Status: Within Functional Limits for tasks assessed    Extremity/Trunk Assessment Lower Extremity Assessment Lower Extremity Assessment: Overall WFL for tasks assessed   Balance Balance Balance Assessed: No  End of Session PT - End of Session Equipment Utilized During Treatment: Gait belt Patient left: in bed;with call bell/phone within reach;with family/visitor present  GP     RUSSELL,CINDY 07/03/2013, 4:13 PM

## 2013-07-04 LAB — PROTIME-INR
INR: 1.55 — ABNORMAL HIGH (ref 0.00–1.49)
Prothrombin Time: 18.2 seconds — ABNORMAL HIGH (ref 11.6–15.2)

## 2013-07-04 LAB — GLUCOSE, CAPILLARY: GLUCOSE-CAPILLARY: 276 mg/dL — AB (ref 70–99)

## 2013-07-04 MED ORDER — LEVOFLOXACIN 750 MG PO TABS: 750.0000 mg | ORAL_TABLET | Freq: Every day | ORAL | Status: DC

## 2013-07-04 MED ORDER — DILTIAZEM HCL ER COATED BEADS 240 MG PO CP24: 240.0000 mg | ORAL_CAPSULE | Freq: Every day | ORAL | Status: DC

## 2013-07-04 NOTE — Discharge Summary (Signed)
NAMEAVELYN, BLUNCK                 ACCOUNT NO.:  [id number]  MEDICAL RECORD NO.:  [id number]  LOCATION:  A321                          FACILITY:  [facility]  PHYSICIAN:  [name], M.D.DATE OF BIRTH:  1950/11/01  DATE OF ADMISSION:  06/29/2013 DATE OF DISCHARGE:  01/01/2015LH                              DISCHARGE SUMMARY   HISTORY OF PRESENT ILLNESS:  This 63 year old was admitted to the hospital with acute respiratory distress.  She had a fairly benign 5-day hospitalization extending from June 30, 2013 to July 04, 2013. Her vital signs were stable once her heart rate was controlled.  On admission, she was found to be in atrial fibrillation with rapid ventricular response with a rate in the 150s.  Her admission white cell count was 10,900, recheck 9800.  Her hemoglobin was 10.7.  Admission potassium was 3.1.  Ferritin level was 18, iron level 34 with a saturation of 8%.  A 25-hydroxy vitamin D was 35 and B12 level 701.  Albumin was 3.0.  Her glucoses remained elevated in the hospital at 279, 332, 294, and she had an A1c of 10.6%.  PTH 115.7, and TSH 2.021.  Her urine was negative for Legionella and for Strep pneumonia.  Her MRSA was positive by PCR.  Blood cultures x2 showed no growth at 4 days.  Her admission chest x-ray showed a left mid lung opacity felt to be atelectasis.  There is moderate cardiomegaly.  Cardiac echo showed a wall thickness increased in the left ventricle consistent with mild LVH. She had mild reduction in systolic function with an estimated ejection fraction of 45-50% and diffuse hypokinesis.  She had high ventricular filling pressures.  She had mild dilatation of the left atrium and the right atrium.  The systolic pressure was mildly increased in the pulmonary arteries with PA peak pressure of 54 mmHg.  Her 12-lead EKG showed atrial fibrillation with a rate of 98 and recheck is 105.  She had incomplete right bundle branch block, and  T-wave inversion in the anterior leads.  She was admitted to the hospital into the ICU.  She was put on telemetry and albuterol neb treatments.  She was given Levaquin 5 mg daily,  then switched to 750 daily.  She was put on diltiazem IV to control the rate and kept on levothyroxine, metoprolol, and other home meds.  Once her heart rate was controlled, she felt better and then she was switched to diltiazem 30 mg every 6 hours.  She stabilized on this and then was switched to diltiazem 180 mg long-acting once a day.  By the day of discharge, she was much improved.  She is discharged on acetaminophen 650 mg q.8 hours for arthritic pains, Tessalon Perles 100 mg q.8 hours p.r.n. cough, ferrous sulfate 325 mg q.a.m., furosemide 40 mg q.a.m. as needed for edema, glipizide 10 mg b.i.d., Lantus insulin 50 units at bedtime, when the blood sugar would exceed 200, Keppra 250 mg b.i.d., levothyroxine 137 mcg q.a.m., loperamide 2 mg every other day, lorazepam 1 mg t.i.d. for anxiety, metaxalone 800 mg daily for muscle spasm, metformin 500 mg 2 tablets b.i.d., metoclopramide 10 mg q.i.d., metoprolol 100 mg  b.i.d., nitroglycerin 0.4 mg sublingually p.r.n. chest pain or pressure, omeprazole 20 mg b.i.d., ondansetron 8 mg daily p.r.n. nausea or vomiting, potassium chloride 20 mEq 3 times a day, simvastatin 40 mg at bedtime, warfarin as directed, and levofloxacin 750 mg per day for 10 days (10 with no refills) and diltiazem extended release capsule 240 mg daily (30 with 11 refills).  FINAL DISCHARGE DIAGNOSES: 1. Acute respiratory distress. 2. Atrial fibrillation with rapid ventricular response. 3. Presumptive pneumonia/bronchitis. 4. Hypokalemia. 5. Uncontrolled type 2 diabetes, on insulin. 6. Benign essential hypertension. 7. Hyperlipidemia. 8. Warfarin anticoagulation.  PLAN:  She is due to see in the office 4 days after discharge.  I cautioned her on the warfarin and levofloxacin interaction,  but her INR has been running on the low side any way.  She will have her INR recheck in 4 days at the time I see her.  I discussed all this with the patient and her daughter.     Mila HomerStephen D. Sudie BaileyKnowlton, M.D.     SDK/MEDQ  D:  07/04/2013  T:  07/04/2013  Job:  [id number]

## 2013-07-04 NOTE — Progress Notes (Signed)
Patient still refusing CPAP. 

## 2013-07-04 NOTE — Progress Notes (Signed)
NAMEMEGYN, [name]                 ACCOUNT NO.:  [id number]  MEDICAL RECORD NO.:  [id number]  LOCATION:  A321                          FACILITY:  [facility]  PHYSICIAN:  [name], M.D.DATE OF BIRTH:  [birth year]  DATE OF PROCEDURE: DATE OF DISCHARGE:                                PROGRESS NOTE   SUBJECTIVE:  She does feel somewhat better.  She did not walk yesterday other than going to the bathroom.  She did refuse CPAP.  OBJECTIVE:  VITAL SIGNS:  Temperature 97.9, pulse 72, respiratory rate 20, blood pressure 142/75. GENERAL:  Her color is good.  Her speech is normal for her, which is somewhat halting. HEART:  Has a fairly regular rhythm with a rate of about 80. LUNGS:  Appear to be fairly clear throughout and she is moving air well. There are no intercostal retractions or use of accessory muscles with respiration. EXTREMITIES:  There is no edema of the ankles.  LABORATORY DATA:  Her glucoses have been in the upper 200 and lower 300 range.  Her PTH was 115.7.  Potassium was 2.8 yesterday after which potassium was increased and her hemoglobin was 10.4 yesterday.  Her INR today was 1.63.  ASSESSMENT: 1. Congestive heart failure, improved. 2. Atrial fibrillation with rapid ventricular response, now rate     controlled. 3. Bronchitis/pneumonia. 4. Uncontrolled type 2 diabetes. 5. Hypokalemia. 6. Chronic anemia. 7. Warfarin anticoagulation. 8. Hypothyroidism. 9. Primary hyperparathyroidism.  PLAN: 1. Pharmacy to continue to monitor her INR.  She is to be up in a     chair more today. 2. She is to continue with IV antibiotics. 3. CBC and BMP are pending for tomorrow. 4. I have increased her Lantus from 30-40 units at bedtime.  I have     discontinued diltiazem 30 mg q.6 hours and switched to diltiazem     long-acting 180 mg daily.     [name], M.D.     SDK/MEDQ  D:  07/03/2013  T:  07/04/2013  Job:  [id number]

## 2013-07-04 NOTE — Progress Notes (Signed)
Patient d/c home with instructions. Verbalizes understanding. IV cath removed and intact. No pain/swelling at site.

## 2013-07-05 LAB — CULTURE, BLOOD (ROUTINE X 2)
Culture: NO GROWTH
Culture: NO GROWTH

## 2013-07-08 ENCOUNTER — Emergency Department (HOSPITAL_COMMUNITY)
Admission: EM | Admit: 2013-07-08 | Discharge: 2013-07-08 | Disposition: A | Discharge status: Home / Self Care | Payer: Medicare Other | Attending: Emergency Medicine | Admitting: Emergency Medicine

## 2013-07-08 ENCOUNTER — Emergency Department (HOSPITAL_COMMUNITY): Payer: Medicare Other

## 2013-07-08 ENCOUNTER — Encounter (HOSPITAL_COMMUNITY): Payer: Self-pay | Admitting: Emergency Medicine

## 2013-07-08 DIAGNOSIS — R233 Spontaneous ecchymoses: Secondary | ICD-10-CM | POA: Insufficient documentation

## 2013-07-08 DIAGNOSIS — R739 Hyperglycemia, unspecified: Secondary | ICD-10-CM

## 2013-07-08 DIAGNOSIS — Z794 Long term (current) use of insulin: Secondary | ICD-10-CM | POA: Insufficient documentation

## 2013-07-08 DIAGNOSIS — E039 Hypothyroidism, unspecified: Secondary | ICD-10-CM | POA: Insufficient documentation

## 2013-07-08 DIAGNOSIS — E785 Hyperlipidemia, unspecified: Secondary | ICD-10-CM | POA: Insufficient documentation

## 2013-07-08 DIAGNOSIS — R0602 Shortness of breath: Secondary | ICD-10-CM | POA: Insufficient documentation

## 2013-07-08 DIAGNOSIS — D649 Anemia, unspecified: Secondary | ICD-10-CM | POA: Insufficient documentation

## 2013-07-08 DIAGNOSIS — Z87891 Personal history of nicotine dependence: Secondary | ICD-10-CM | POA: Insufficient documentation

## 2013-07-08 DIAGNOSIS — R5383 Other fatigue: Secondary | ICD-10-CM

## 2013-07-08 DIAGNOSIS — I4891 Unspecified atrial fibrillation: Secondary | ICD-10-CM | POA: Insufficient documentation

## 2013-07-08 DIAGNOSIS — E119 Type 2 diabetes mellitus without complications: Secondary | ICD-10-CM | POA: Insufficient documentation

## 2013-07-08 DIAGNOSIS — G40909 Epilepsy, unspecified, not intractable, without status epilepticus: Secondary | ICD-10-CM | POA: Insufficient documentation

## 2013-07-08 DIAGNOSIS — Z8701 Personal history of pneumonia (recurrent): Secondary | ICD-10-CM | POA: Insufficient documentation

## 2013-07-08 DIAGNOSIS — I10 Essential (primary) hypertension: Secondary | ICD-10-CM | POA: Insufficient documentation

## 2013-07-08 DIAGNOSIS — I509 Heart failure, unspecified: Secondary | ICD-10-CM | POA: Insufficient documentation

## 2013-07-08 DIAGNOSIS — Z95818 Presence of other cardiac implants and grafts: Secondary | ICD-10-CM | POA: Insufficient documentation

## 2013-07-08 DIAGNOSIS — E669 Obesity, unspecified: Secondary | ICD-10-CM | POA: Insufficient documentation

## 2013-07-08 DIAGNOSIS — G4733 Obstructive sleep apnea (adult) (pediatric): Secondary | ICD-10-CM | POA: Insufficient documentation

## 2013-07-08 DIAGNOSIS — R531 Weakness: Secondary | ICD-10-CM

## 2013-07-08 DIAGNOSIS — G8929 Other chronic pain: Secondary | ICD-10-CM | POA: Insufficient documentation

## 2013-07-08 DIAGNOSIS — Z79899 Other long term (current) drug therapy: Secondary | ICD-10-CM | POA: Insufficient documentation

## 2013-07-08 DIAGNOSIS — Z7901 Long term (current) use of anticoagulants: Secondary | ICD-10-CM | POA: Insufficient documentation

## 2013-07-08 DIAGNOSIS — R5381 Other malaise: Secondary | ICD-10-CM | POA: Insufficient documentation

## 2013-07-08 DIAGNOSIS — K2270 Barrett's esophagus without dysplasia: Secondary | ICD-10-CM | POA: Insufficient documentation

## 2013-07-08 LAB — BASIC METABOLIC PANEL
BUN: 11 mg/dL (ref 6–23)
BUN: 11 mg/dL (ref 6–23)
CALCIUM: 9 mg/dL (ref 8.4–10.5)
CHLORIDE: 107 meq/L (ref 96–112)
CO2: 19 mEq/L (ref 19–32)
CO2: 22 meq/L (ref 19–32)
CREATININE: 0.61 mg/dL (ref 0.50–1.10)
Calcium: 8.2 mg/dL — ABNORMAL LOW (ref 8.4–10.5)
Chloride: 103 mEq/L (ref 96–112)
Creatinine, Ser: 0.63 mg/dL (ref 0.50–1.10)
GFR calc Af Amer: >90 mL/min (ref >90)
GFR calc Af Amer: >90 mL/min (ref >90)
GFR calc non Af Amer: >90 mL/min (ref >90)
GLUCOSE: 266 mg/dL — AB (ref 70–99)
Glucose, Bld: 214 mg/dL — ABNORMAL HIGH (ref 70–99)
POTASSIUM: 4.9 meq/L (ref 3.7–5.3)
Potassium: 5.1 mEq/L (ref 3.7–5.3)
Sodium: 138 mEq/L (ref 137–147)
Sodium: 140 mEq/L (ref 137–147)

## 2013-07-08 LAB — PROTIME-INR
INR: 2.24 — ABNORMAL HIGH (ref 0.00–1.49)
Prothrombin Time: 24.1 seconds — ABNORMAL HIGH (ref 11.6–15.2)

## 2013-07-08 LAB — CBC WITH DIFFERENTIAL/PLATELET
Basophils Absolute: 0 10*3/uL (ref 0.0–0.1)
Basophils Relative: 0 % (ref 0–1)
EOS PCT: 1 % (ref 0–5)
Eosinophils Absolute: 0.1 10*3/uL (ref 0.0–0.7)
HEMATOCRIT: 34.1 % — AB (ref 36.0–46.0)
Hemoglobin: 10.4 g/dL — ABNORMAL LOW (ref 12.0–15.0)
LYMPHS ABS: 2.4 10*3/uL (ref 0.7–4.0)
Lymphocytes Relative: 26 % (ref 12–46)
MCH: 26.9 pg (ref 26.0–34.0)
MCHC: 30.5 g/dL (ref 30.0–36.0)
MCV: 88.1 fL (ref 78.0–100.0)
MONOS PCT: 9 % (ref 3–12)
Monocytes Absolute: 0.9 10*3/uL (ref 0.1–1.0)
Neutro Abs: 6.1 10*3/uL (ref 1.7–7.7)
Neutrophils Relative %: 64 % (ref 43–77)
PLATELETS: 257 10*3/uL (ref 150–400)
RBC: 3.87 MIL/uL (ref 3.87–5.11)
RDW: 14.9 % (ref 11.5–15.5)
WBC: 9.5 10*3/uL (ref 4.0–10.5)

## 2013-07-08 LAB — TROPONIN I: Troponin I: <0.3 ng/mL (ref <0.30)

## 2013-07-08 LAB — GLUCOSE, CAPILLARY: Glucose-Capillary: 188 mg/dL — ABNORMAL HIGH (ref 70–99)

## 2013-07-08 MED ORDER — SODIUM CHLORIDE 0.9 % IV BOLUS (SEPSIS)
1000.0000 mL | Freq: Once | INTRAVENOUS | Status: AC
  Administered 2013-07-08: 1000 mL via INTRAVENOUS

## 2013-07-08 MED ORDER — HYDROCODONE-ACETAMINOPHEN 5-325 MG PO TABS: 2.0000 | ORAL_TABLET | Freq: Four times a day (QID) | ORAL | Status: DC | PRN

## 2013-07-08 MED ORDER — INSULIN ASPART 100 UNIT/ML ~~LOC~~ SOLN
5.0000 [IU] | Freq: Once | SUBCUTANEOUS | Status: AC
  Administered 2013-07-08: 5 [IU] via SUBCUTANEOUS
  Filled 2013-07-08: qty 1

## 2013-07-08 MED ORDER — HYDROCODONE-ACETAMINOPHEN 5-325 MG PO TABS
2.0000 | ORAL_TABLET | Freq: Once | ORAL | Status: AC
  Administered 2013-07-08: 2 via ORAL
  Filled 2013-07-08: qty 2

## 2013-07-08 NOTE — Discharge Instructions (Signed)
Your caregiver has seen you today because you are having problems with feelings of weakness, dizziness, and/or fatigue. Weakness has many different causes, some of which are common and others are very rare. Your caregiver has considered some of the most common causes of weakness and feels it is safe for you to go home and be observed. Not every illness or injury can be identified during an emergency department visit, thus follow-up with your primary healthcare provider is important. Medical conditions can also worsen, so it is also important to return immediately as directed below, or if you have other serious concerns develop. RETURN IMMEDIATELY IF you develop new shortness of breath, chest pain, fever, have difficulty moving parts of your body (new weakness, numbness, or incoordination), sudden change in speech, vision, swallowing, or understanding, faint or develop new dizziness, severe headache, become poorly responsive or have an altered mental status compared to baseline for you, new rash, abdominal pain, or bloody stools,  Return sooner also if you develop new problems for which you have not talked to your caregiver but you feel may be emergency medical conditions, or are unable to be cared for safely at home.  

## 2013-07-08 NOTE — ED Notes (Signed)
Patient denies having any chest pain at the moment. Patient does state that it feels as if it hard for her to catch her breath. Patient in NAD. Will continue to monitor patient.

## 2013-07-08 NOTE — ED Notes (Addendum)
Pt c/o sob and soreness in left shoulder. Pt also c/o chest pain that started around 3pm today.   Reports worsening SOB since being in the hospital 1 week ago.  Reports was admitted for admitted afib and an infection last Saturday.

## 2013-07-08 NOTE — Progress Notes (Signed)
UR chart review completed.  

## 2013-07-08 NOTE — ED Provider Notes (Signed)
CSN: [id number]     Arrival date & time 07/08/13  1744 History   First MD Initiated Contact with Patient 07/08/13 1819     Chief Complaint  Patient presents with  . Shortness of Breath  . Arm Pain   (Consider location/radiation/quality/duration/timing/severity/associated sxs/prior Treatment) HPI 2-3 weeks of coughing and shortness of breath admitted for PNA with rapid atrial fibrillation with mild heart failure had been on Zithromax and switched to Levaquin now home for 5 days still generally weak and short of breath at times but no fever no confusion but left upper arm is sore with some bruising where she had injections in the hospital with localized tenderness without radiation or associated symptoms or numbness or redness or pus drainage from her arm; she has no edema today no fever;  shortness of breath seems a little worse again today so she came to the ED for recheck.   Past Medical History  Diagnosis Date  . Chest pain     Negative cardiac catheterization in 2002; negative stress nuclear study in 2008  . Congestive heart failure     Normal LV systolic function  . PAF (paroxysmal atrial fibrillation) 2009  . Chronic anticoagulation   . Diabetes mellitus, type 2     Insulin therapy; exacerbated by prednisone  . Syncope     Admitted 05/2009; magnetic resonance imagin/ MRA - negative; etiology thought to be orthostasis secondary to drugs and dehydration  . GERD (gastroesophageal reflux disease)   . IBS (irritable bowel syndrome)   . Hiatal hernia   . Gastroparesis     99% retention 05/2008 on GES  . Hyperlipidemia   . Hypertension   . Hypothyroid   . Chronic LBP     Surgical intervention in 1996  . Obesity   . OSA on CPAP   . Anemia     H/H of 10/30 with a normal MCV in 12/09  . Barrett's esophagus     Diagnosed 1995. Last EGD 2008.   . Seizures    Past Surgical History  Procedure Laterality Date  . Cardiovascular stress test  2008    Stress nuclear study  . Back  surgery  1996  . Laparoscopic cholecystectomy  1990s  . Total abdominal hysterectomy  1999  . Laminectomy  1995    L4-L5  . Carpal tunnel release  1994  . Esophagogastroduodenoscopy  2008    Barrett's without dysplasia. esphagus dilated. antral erosions, h.pylori serologies negative.  . Colonoscopy  2013    Patient reports normal TCS around 10 years ago; and a repeat study in 2013 that was negative  . Givens capsule study  12/07/2011    Procedure: GIVENS CAPSULE STUDY;  Surgeon: West Bali, MD;  Location: [facility];  Service: Endoscopy;  Laterality: N/A;  . Cardiac catheterization  2002   Family History  Problem Relation Age of Onset  . Hypertension Mother   . Alzheimer's disease Mother   . Stroke Mother   . Heart attack Mother   . Heart disease Neg Hx   . Hypertension Other   . Colon cancer Neg Hx    History  Substance Use Topics  . Smoking status: Former Smoker -- 15 years    Quit date: 07/01/1983  . Smokeless tobacco: Never Used     Comment: Remote  . Alcohol Use: No     Comment: last etoh one year ago, never frequent   OB History   Grav Para Term Preterm Abortions TAB SAB Ect  Mult Living                 Review of Systems 10 Systems reviewed and are negative for acute change except as noted in the HPI. Allergies  Celexa and Codeine  Home Medications   Current Outpatient Rx  Name  Route  Sig  Dispense  Refill  . acetaminophen (TYLENOL ARTHRITIS PAIN) 650 MG CR tablet   Oral   Take 650 mg by mouth every 8 (eight) hours as needed for pain.         Marland Kitchen. diltiazem (CARDIZEM CD) 240 MG 24 hr capsule   Oral   Take 240 mg by mouth every evening.         . furosemide (LASIX) 40 MG tablet   Oral   Take 40 mg by mouth daily as needed. Swelling         . glipiZIDE (GLUCOTROL) 10 MG tablet   Oral   Take 10 mg by mouth 2 (two) times daily.          . insulin glargine (LANTUS) 100 UNIT/ML injection   Subcutaneous   Inject 50 Units into the skin at  bedtime. *ONLY takes when blood sugar levels exceed 200*         . levETIRAcetam (KEPPRA) 250 MG tablet   Oral   Take 1 tablet (250 mg total) by mouth 2 (two) times daily.   60 tablet   11   . levofloxacin (LEVAQUIN) 750 MG tablet   Oral   Take 1 tablet (750 mg total) by mouth daily.   10 tablet   0   . levothyroxine (SYNTHROID, LEVOTHROID) 137 MCG tablet   Oral   Take 137 mcg by mouth daily before breakfast.         . loperamide (ANTI-DIARRHEAL) 2 MG capsule   Oral   Take 2 mg by mouth every other day. Takes as needed for diarrhea         . LORazepam (ATIVAN) 1 MG tablet   Oral   Take 1 mg by mouth 3 (three) times daily as needed. For anxiety         . metaxalone (SKELAXIN) 800 MG tablet   Oral   Take 800 mg by mouth daily.          . metFORMIN (GLUCOPHAGE) 500 MG tablet   Oral   Take 2,000 mg by mouth every morning.          . metoCLOPramide (REGLAN) 10 MG tablet   Oral   Take 10 mg by mouth 4 (four) times daily.          . metoprolol (LOPRESSOR) 100 MG tablet   Oral   Take 100 mg by mouth 2 (two) times daily.         Marland Kitchen. omeprazole (PRILOSEC) 20 MG capsule   Oral   Take 20 mg by mouth 2 (two) times daily.           . ondansetron (ZOFRAN) 8 MG tablet   Oral   Take 8 mg by mouth daily as needed. nausea         . potassium chloride SA (K-DUR,KLOR-CON) 20 MEQ tablet   Oral   Take 20 mEq by mouth 3 (three) times daily.          . simvastatin (ZOCOR) 40 MG tablet   Oral   Take 40 mg by mouth at bedtime.          Marland Kitchen. warfarin (COUMADIN)  2.5 MG tablet   Oral   Take 1.25 mg by mouth every evening.         . ferrous sulfate 325 (65 FE) MG tablet   Oral   Take 1 tablet (325 mg total) by mouth daily with breakfast.   30 tablet   11   . HYDROcodone-acetaminophen (NORCO) 5-325 MG per tablet   Oral   Take 2 tablets by mouth every 6 (six) hours as needed for severe pain.   10 tablet   0   . nitroGLYCERIN (NITROSTAT) 0.4 MG SL tablet    Sublingual   Place 0.4 mg under the tongue as needed. For chest pain           BP 148/92  Pulse 95  Temp(Src) 97.7 F (36.5 C) (Oral)  Resp 21  SpO2 99% Physical Exam  Nursing note and vitals reviewed. Constitutional:  Awake, alert, nontoxic appearance.  HENT:  Head: Atraumatic.  Eyes: Right eye exhibits no discharge. Left eye exhibits no discharge.  Neck: Neck supple.  Cardiovascular:  No murmur heard. cardiac irregularly irregular   Pulmonary/Chest: Effort normal and breath sounds normal. No respiratory distress. She has no wheezes. She has no rales. She exhibits no tenderness.  Lungs clear  pulse oximetry normal room air 96%  Abdominal: Soft. There is no tenderness. There is no rebound.  Musculoskeletal: She exhibits tenderness. She exhibits no edema.  Baseline ROM, no obvious new focal weakness.no edema; left upper arm mild tenderness mild bruising without erythema induration or fluctuance   Neurological: She is alert.  Mental status and motor strength appears baseline for patient and situation.  Skin: No rash noted.  Psychiatric: She has a normal mood and affect.    ED Course  Procedures (including critical care time) Pt stable in ED with no significant deterioration in condition.Patient / Family / Caregiver informed of clinical course, understand medical decision-making process, and agree with plan. Labs Review Labs Reviewed  CBC WITH DIFFERENTIAL - Abnormal; Notable for the following:    Hemoglobin 10.4 (*)    HCT 34.1 (*)    All other components within normal limits  BASIC METABOLIC PANEL - Abnormal; Notable for the following:    Glucose, Bld 266 (*)    All other components within normal limits  PROTIME-INR - Abnormal; Notable for the following:    Prothrombin Time 24.1 (*)    INR 2.24 (*)    All other components within normal limits  BASIC METABOLIC PANEL - Abnormal; Notable for the following:    Glucose, Bld 214 (*)    Calcium 8.2 (*)    All other  components within normal limits  GLUCOSE, CAPILLARY - Abnormal; Notable for the following:    Glucose-Capillary 188 (*)    All other components within normal limits  TROPONIN I   Imaging Review No results found.  EKG Interpretation    Date/Time:  Monday July 08 2013 17:51:53 EST Ventricular Rate:  98 PR Interval:    QRS Duration: 94 QT Interval:  368 QTC Calculation: 469 R Axis:   -52 Text Interpretation:  Atrial fibrillation Left anterior fascicular block Nonspecific ST abnormality When compared with ECG of 30-Jun-2013 03:16, T wave inversion no longer evident in Anterolateral leads Confirmed by [facility]  MD, Cerina Leary ([id number]) on 07/08/2013 6:34:40 PM            MDM   1. Generalized weakness   2. Hyperglycemia    I doubt any other EMC precluding discharge at this time  including, but not necessarily limited to the following:sepsis, ACS.    [name], MD 07/10/13 2221

## 2013-07-11 ENCOUNTER — Emergency Department (HOSPITAL_COMMUNITY): Payer: Medicare Other

## 2013-07-11 ENCOUNTER — Encounter (HOSPITAL_COMMUNITY): Payer: Self-pay | Admitting: Emergency Medicine

## 2013-07-11 ENCOUNTER — Emergency Department (HOSPITAL_COMMUNITY)
Admission: EM | Admit: 2013-07-11 | Discharge: 2013-07-11 | Disposition: A | Discharge status: Home / Self Care | Payer: Medicare Other | Attending: Emergency Medicine | Admitting: Emergency Medicine

## 2013-07-11 DIAGNOSIS — M25519 Pain in unspecified shoulder: Secondary | ICD-10-CM | POA: Insufficient documentation

## 2013-07-11 DIAGNOSIS — Z79899 Other long term (current) drug therapy: Secondary | ICD-10-CM | POA: Insufficient documentation

## 2013-07-11 DIAGNOSIS — G40909 Epilepsy, unspecified, not intractable, without status epilepticus: Secondary | ICD-10-CM | POA: Insufficient documentation

## 2013-07-11 DIAGNOSIS — K219 Gastro-esophageal reflux disease without esophagitis: Secondary | ICD-10-CM | POA: Insufficient documentation

## 2013-07-11 DIAGNOSIS — E039 Hypothyroidism, unspecified: Secondary | ICD-10-CM | POA: Insufficient documentation

## 2013-07-11 DIAGNOSIS — I10 Essential (primary) hypertension: Secondary | ICD-10-CM | POA: Insufficient documentation

## 2013-07-11 DIAGNOSIS — Z794 Long term (current) use of insulin: Secondary | ICD-10-CM | POA: Insufficient documentation

## 2013-07-11 DIAGNOSIS — G4733 Obstructive sleep apnea (adult) (pediatric): Secondary | ICD-10-CM | POA: Insufficient documentation

## 2013-07-11 DIAGNOSIS — I4891 Unspecified atrial fibrillation: Secondary | ICD-10-CM | POA: Insufficient documentation

## 2013-07-11 DIAGNOSIS — Z95818 Presence of other cardiac implants and grafts: Secondary | ICD-10-CM | POA: Insufficient documentation

## 2013-07-11 DIAGNOSIS — E669 Obesity, unspecified: Secondary | ICD-10-CM | POA: Insufficient documentation

## 2013-07-11 DIAGNOSIS — Z792 Long term (current) use of antibiotics: Secondary | ICD-10-CM | POA: Insufficient documentation

## 2013-07-11 DIAGNOSIS — R52 Pain, unspecified: Secondary | ICD-10-CM | POA: Insufficient documentation

## 2013-07-11 DIAGNOSIS — D649 Anemia, unspecified: Secondary | ICD-10-CM | POA: Insufficient documentation

## 2013-07-11 DIAGNOSIS — Z9889 Other specified postprocedural states: Secondary | ICD-10-CM | POA: Insufficient documentation

## 2013-07-11 DIAGNOSIS — M25512 Pain in left shoulder: Secondary | ICD-10-CM

## 2013-07-11 DIAGNOSIS — E785 Hyperlipidemia, unspecified: Secondary | ICD-10-CM | POA: Insufficient documentation

## 2013-07-11 DIAGNOSIS — Z87891 Personal history of nicotine dependence: Secondary | ICD-10-CM | POA: Insufficient documentation

## 2013-07-11 DIAGNOSIS — I509 Heart failure, unspecified: Secondary | ICD-10-CM | POA: Insufficient documentation

## 2013-07-11 DIAGNOSIS — Z7901 Long term (current) use of anticoagulants: Secondary | ICD-10-CM | POA: Insufficient documentation

## 2013-07-11 DIAGNOSIS — E119 Type 2 diabetes mellitus without complications: Secondary | ICD-10-CM | POA: Insufficient documentation

## 2013-07-11 DIAGNOSIS — G8929 Other chronic pain: Secondary | ICD-10-CM | POA: Insufficient documentation

## 2013-07-11 MED ORDER — HYDROMORPHONE HCL PF 1 MG/ML IJ SOLN
1.0000 mg | Freq: Once | INTRAMUSCULAR | Status: AC
  Administered 2013-07-11: 1 mg via INTRAMUSCULAR

## 2013-07-11 MED ORDER — HYDROMORPHONE HCL PF 1 MG/ML IJ SOLN
INTRAMUSCULAR | Status: AC
  Filled 2013-07-11: qty 1

## 2013-07-11 MED ORDER — HYDROCODONE-ACETAMINOPHEN 5-325 MG PO TABS
2.0000 | ORAL_TABLET | Freq: Four times a day (QID) | ORAL | Status: DC | PRN
Start: 2013-07-11 — End: 2013-08-26

## 2013-07-11 NOTE — ED Notes (Signed)
Pain lt shoulder for 1 week,  Increased pain with movement.  Adm 12/27  And seen in ER on 1/5 , says sob that she had when admitted is better.  NO injury. known

## 2013-07-11 NOTE — Discharge Instructions (Signed)
As discussed, your evaluation has been largely reassuring.  It is important that you follow-up with both your physician and the orthopedist for further evaluation and management.  Return here for concerning changes in your condition.

## 2013-07-11 NOTE — ED Provider Notes (Signed)
CSN: [id number]     Arrival date & time 07/11/13  1532 History  This chart was scribed for Gerhard Munch, MD by Dorothey Baseman, ED Scribe. This patient was seen in room [facility]08[facility]08 and the patient's care was started at 3:56 PM.    Chief Complaint  Patient presents with  . Shoulder Pain   The history is provided by the patient. No language interpreter was used.   HPI Comments: [name] is a 63 y.o. female who presents to the Emergency Department complaining of a constant, sharp/stabbing pain to the left shoulder onset about 1 week ago that she states is exacerbated with movement. Patient denies any potential injury or trauma to the area. She states that she followed up with her PCP for this and was given hydrocodone, which she states was effective at relieving her symptoms, but that she has since run out of the pain medication. She reports applying heat to the area at home without significant relief. She denies chest pain, shortness of breath, fever. Patient has a history of DM, CHF, HTN, hyperlipidemia, and hypothyroidism.   PCP- Dr. John Giovanni  Past Medical History  Diagnosis Date  . Chest pain     Negative cardiac catheterization in 2002; negative stress nuclear study in 2008  . Congestive heart failure     Normal LV systolic function  . PAF (paroxysmal atrial fibrillation) 2009  . Chronic anticoagulation   . Diabetes mellitus, type 2     Insulin therapy; exacerbated by prednisone  . Syncope     Admitted 05/2009; magnetic resonance imagin/ MRA - negative; etiology thought to be orthostasis secondary to drugs and dehydration  . GERD (gastroesophageal reflux disease)   . IBS (irritable bowel syndrome)   . Hiatal hernia   . Gastroparesis     99% retention 05/2008 on GES  . Hyperlipidemia   . Hypertension   . Hypothyroid   . Chronic LBP     Surgical intervention in 1996  . Obesity   . OSA on CPAP   . Anemia     H/H of 10/30 with a normal MCV in 12/09  . Barrett's  esophagus     Diagnosed 1995. Last EGD 2008.   . Seizures    Past Surgical History  Procedure Laterality Date  . Cardiovascular stress test  2008    Stress nuclear study  . Back surgery  1996  . Laparoscopic cholecystectomy  1990s  . Total abdominal hysterectomy  1999  . Laminectomy  1995    L4-L5  . Carpal tunnel release  1994  . Esophagogastroduodenoscopy  2008    Barrett's without dysplasia. esphagus dilated. antral erosions, h.pylori serologies negative.  . Colonoscopy  2013    Patient reports normal TCS around 10 years ago; and a repeat study in 2013 that was negative  . Givens capsule study  12/07/2011    Procedure: GIVENS CAPSULE STUDY;  Surgeon: West Bali, MD;  Location: [facility];  Service: Endoscopy;  Laterality: N/A;  . Cardiac catheterization  2002   Family History  Problem Relation Age of Onset  . Hypertension Mother   . Alzheimer's disease Mother   . Stroke Mother   . Heart attack Mother   . Heart disease Neg Hx   . Hypertension Other   . Colon cancer Neg Hx    History  Substance Use Topics  . Smoking status: Former Smoker -- 15 years    Quit date: 07/01/1983  . Smokeless tobacco: Never Used  Comment: Remote  . Alcohol Use: No     Comment: last etoh one year ago, never frequent   OB History   Grav Para Term Preterm Abortions TAB SAB Ect Mult Living                 Review of Systems  Constitutional: Negative for fever.       Per HPI, otherwise negative  HENT:       Per HPI, otherwise negative  Respiratory: Negative for shortness of breath.        Per HPI, otherwise negative  Cardiovascular: Negative for chest pain.       Per HPI, otherwise negative  Gastrointestinal: Negative for vomiting.  Endocrine:       Negative aside from HPI  Genitourinary:       Neg aside from HPI   Musculoskeletal: Positive for arthralgias.       Per HPI, otherwise negative  Skin: Negative.   Neurological: Negative for syncope.    Allergies  Celexa and  Codeine  Home Medications   Current Outpatient Rx  Name  Route  Sig  Dispense  Refill  . acetaminophen (TYLENOL ARTHRITIS PAIN) 650 MG CR tablet   Oral   Take 650 mg by mouth every 8 (eight) hours as needed for pain.         Marland Kitchen diltiazem (CARDIZEM CD) 240 MG 24 hr capsule   Oral   Take 240 mg by mouth every evening.         . ferrous sulfate 325 (65 FE) MG tablet   Oral   Take 1 tablet (325 mg total) by mouth daily with breakfast.   30 tablet   11   . furosemide (LASIX) 40 MG tablet   Oral   Take 40 mg by mouth daily as needed. Swelling         . glipiZIDE (GLUCOTROL) 10 MG tablet   Oral   Take 10 mg by mouth 2 (two) times daily.          Marland Kitchen HYDROcodone-acetaminophen (NORCO) 5-325 MG per tablet   Oral   Take 2 tablets by mouth every 6 (six) hours as needed for severe pain.   10 tablet   0   . insulin glargine (LANTUS) 100 UNIT/ML injection   Subcutaneous   Inject 50 Units into the skin at bedtime. *ONLY takes when blood sugar levels exceed 200*         . levETIRAcetam (KEPPRA) 250 MG tablet   Oral   Take 1 tablet (250 mg total) by mouth 2 (two) times daily.   60 tablet   11   . levofloxacin (LEVAQUIN) 750 MG tablet   Oral   Take 1 tablet (750 mg total) by mouth daily.   10 tablet   0   . levothyroxine (SYNTHROID, LEVOTHROID) 137 MCG tablet   Oral   Take 137 mcg by mouth daily before breakfast.         . loperamide (ANTI-DIARRHEAL) 2 MG capsule   Oral   Take 2 mg by mouth every other day. Takes as needed for diarrhea         . LORazepam (ATIVAN) 1 MG tablet   Oral   Take 1 mg by mouth 3 (three) times daily as needed. For anxiety         . metaxalone (SKELAXIN) 800 MG tablet   Oral   Take 800 mg by mouth daily.          Marland Kitchen  metFORMIN (GLUCOPHAGE) 500 MG tablet   Oral   Take 2,000 mg by mouth every morning.          . metoCLOPramide (REGLAN) 10 MG tablet   Oral   Take 10 mg by mouth 4 (four) times daily.          . metoprolol  (LOPRESSOR) 100 MG tablet   Oral   Take 100 mg by mouth 2 (two) times daily.         . nitroGLYCERIN (NITROSTAT) 0.4 MG SL tablet   Sublingual   Place 0.4 mg under the tongue as needed. For chest pain          . omeprazole (PRILOSEC) 20 MG capsule   Oral   Take 20 mg by mouth 2 (two) times daily.           . ondansetron (ZOFRAN) 8 MG tablet   Oral   Take 8 mg by mouth daily as needed. nausea         . potassium chloride SA (K-DUR,KLOR-CON) 20 MEQ tablet   Oral   Take 20 mEq by mouth 3 (three) times daily.          . simvastatin (ZOCOR) 40 MG tablet   Oral   Take 40 mg by mouth at bedtime.          Marland Kitchen. warfarin (COUMADIN) 2.5 MG tablet   Oral   Take 1.25 mg by mouth every evening.          Triage Vitals: BP 153/65  Pulse 95  Temp(Src) 97.7 F (36.5 C) (Oral)  Resp 18  Ht 5\' 8"  (1.727 m)  Wt 230 lb (104.327 kg)  BMI 34.98 kg/m2  SpO2 96%  Physical Exam  Nursing note and vitals reviewed. Constitutional: She is oriented to person, place, and time. She appears well-developed and well-nourished. No distress.  HENT:  Head: Normocephalic and atraumatic.  Eyes: Conjunctivae and EOM are normal.  Cardiovascular: Normal rate and normal heart sounds.   Irregular rhythm.   Pulmonary/Chest: Effort normal and breath sounds normal. No stridor. No respiratory distress.  Abdominal: She exhibits no distension.  Musculoskeletal: She exhibits no edema.  Diffuse pain throughout the shoulder without obvious deformity. Mild cervical paraspinal tenderness to palpation. Neurovascularly intact.   Neurological: She is alert and oriented to person, place, and time. No cranial nerve deficit.  Skin: Skin is warm and dry.  Psychiatric: She has a normal mood and affect.    ED Course  Procedures (including critical care time)  DIAGNOSTIC STUDIES: Oxygen Saturation is 96% on room air, normal by my interpretation.    COORDINATION OF CARE: 4:01 PM- Reviewed charts from patient's  recent hospital admission on 06/29/2013 and patient confirms that she has no similar symptoms from that encounter. Will order an x-ray of the left shoulder. Will order Dilaudid to manage symptoms. Discussed treatment plan with patient at bedside and patient verbalized agreement.   6:30 PM- Patient reports feeling much better after receiving the medication. Discussed that x-ray results were normal. Will discharge patient with a refill of pain medication to manage symptoms. Advised patient to follow up with the referral (Dr. Romeo AppleHarrison). Discussed treatment plan with patient at bedside and patient verbalized agreement.   Labs Review Labs Reviewed - No data to display  Imaging Review Dg Shoulder Left  07/11/2013   CLINICAL DATA:  Pain  EXAM: LEFT SHOULDER - 2+ VIEW  COMPARISON:  August 24, 2009  FINDINGS: Frontal, Y scapular, and tilt oblique  images were obtained. There is no fracture or dislocation. There is mild narrowing of the glenohumeral joint. No erosive change or intra-articular calcification.  There is patchy infiltrate in the left lower lobe region.  IMPRESSION: Mild osteoarthritic change. No fracture or dislocation. Incidental note is made of patchy infiltrate in the left lower lobe.   Electronically Signed   By: Bretta Bang M.D.   On: 07/11/2013 17:59   On review  The patient had CT chest 12/27 w identified L opacification.  Today she denies respiratory complaints - the findings on XR likely represent resolving radiographic findings. EKG Interpretation   None       MDM   1. Shoulder pain, acute, left     I personally performed the services described in this documentation, which was scribed in my presence. The recorded information has been reviewed and is accurate.  This patient presents several weeks after hospitalization, now with shoulder pain.  On exam she is uncomfortable appearing, but in no distress.  Patient's x-ray was reassuring, and her condition improved  substantially with a single dose of analgesic. Absent any other complaints, and with the resolution of her pain, the patient is appropriate for discharge with primary care and orthopedics followup.     Gerhard Munch, MD 07/11/13 [id number]

## 2013-07-23 ENCOUNTER — Ambulatory Visit (INDEPENDENT_AMBULATORY_CARE_PROVIDER_SITE_OTHER): Payer: Medicare Other | Admitting: *Deleted

## 2013-07-23 DIAGNOSIS — I4891 Unspecified atrial fibrillation: Secondary | ICD-10-CM

## 2013-07-23 LAB — POCT INR: INR: 4.2

## 2013-07-23 MED ORDER — APIXABAN 5 MG PO TABS: 5.0000 mg | ORAL_TABLET | Freq: Two times a day (BID) | ORAL | Status: DC

## 2013-07-23 NOTE — Patient Instructions (Signed)
Pt was started on Eliquis for atrial fib on 07/23/13 by Dr Purvis Sheffield.    Reviewed patients medication list.  Pt is not currently on any combined P-gp and strong CYP3A4 inhibitors/inducers (ketoconazole, traconazole, ritonavir, carbamazepine, phenytoin, rifampin, St. John's wort).  Reviewed labs.  SCr 0.63, Weight 100.4kg, CrCl- 142.99.  Dose is appropriate based on CrCl.   Hgb and HCT 10.4/34.1 (chronic).  A full discussion of the nature of anticoagulants has been carried out.  A benefit/risk analysis has been presented to the patient, so that they understand the justification for choosing anticoagulation with Eliquis at this time.  The need for compliance is stressed.  Pt is aware to take the medication twice daily.  Side effects of potential bleeding are discussed, including unusual colored urine or stools, coughing up blood or coffee ground emesis, nose bleeds or serious fall or head trauma.  Discussed signs and symptoms of stroke. The patient should avoid any OTC items containing aspirin or ibuprofen.  Avoid alcohol consumption.   Call if any signs of abnormal bleeding.  Discussed financial obligations and resolved any difficulty in obtaining medication.  Next lab test test in 1 month.   [name]

## 2013-07-24 ENCOUNTER — Telehealth: Payer: Self-pay | Admitting: Cardiovascular Disease

## 2013-07-24 NOTE — Telephone Encounter (Signed)
Prior Authorization request / please see paper in refill bin / tgs

## 2013-07-29 ENCOUNTER — Encounter: Payer: Self-pay | Admitting: Adult Health

## 2013-07-29 ENCOUNTER — Ambulatory Visit (INDEPENDENT_AMBULATORY_CARE_PROVIDER_SITE_OTHER): Payer: Medicare Other | Admitting: Adult Health

## 2013-07-29 VITALS — BP 119/55 | HR 97 | Ht 68.0 in | Wt 215.0 lb

## 2013-07-29 DIAGNOSIS — I10 Essential (primary) hypertension: Secondary | ICD-10-CM

## 2013-07-29 DIAGNOSIS — I4891 Unspecified atrial fibrillation: Secondary | ICD-10-CM

## 2013-07-29 DIAGNOSIS — I509 Heart failure, unspecified: Secondary | ICD-10-CM

## 2013-07-29 NOTE — Assessment & Plan Note (Signed)
Heart rate is well controlled currently. She will have diltiazem refilled so that she may pick it up today.  She will continue the same dose. See her again in 4 months. To be established with Dr. Wyline Mood.

## 2013-07-29 NOTE — Assessment & Plan Note (Signed)
She is well compensated. Will make no changes in her meds at this time. I have explained to her that she may noticed some mild edema in the ankles from uses of diltiazem. She is not to worry about this. If she has significant wt gain, shortness of breath, or edema she is to call us.

## 2013-07-29 NOTE — Progress Notes (Deleted)
Name: [name]    DOB: 04-19-1951  Age: 63 y.o.  MR#: [id number]       PCP:  Milana ObeyKNOWLTON,STEPHEN D, MD      Insurance: Payor: [employer] / Plan: AARP MEDICARE COMPLETE / Product Type: *No Product type* /   CC:    Chief Complaint  Patient presents with  . Hypertension   PT NOTED SHE IS OUT OF HER DILTIAZEM AND PT FAMILY MEMBER NOT SURE WHY SHE WAS MISSING LAST WEEK OF DOSAGE IN HER PILL BOX HOWEVER CURRENTLY WAITING ON PHARMACY TO ADVISE SHE CAN HAVE REFILL A WEEK EARLY, LAST PILL TAKEN YESTERDAY VS Filed Vitals:   07/29/13 1357  BP: 119/55  Pulse: 97  Height: 5\' 8"  (1.727 m)  Weight: 215 lb (97.523 kg)    Weights Current Weight  07/29/13 215 lb (97.523 kg)  07/11/13 230 lb (104.327 kg)  07/01/13 224 lb 6.9 oz (101.8 kg)    Blood Pressure  BP Readings from Last 3 Encounters:  07/29/13 119/55  07/11/13 135/93  07/08/13 148/92     Admit date:  (Not on file) Last encounter with RMR:  05/28/2013   Allergy Celexa and Codeine  Current Outpatient Prescriptions  Medication Sig Dispense Refill  . acetaminophen (TYLENOL ARTHRITIS PAIN) 650 MG CR tablet Take 650 mg by mouth every 8 (eight) hours as needed for pain.      Marland Kitchen. apixaban (ELIQUIS) 5 MG TABS tablet Take 1 tablet (5 mg total) by mouth 2 (two) times daily.  60 tablet  3  . ferrous sulfate 325 (65 FE) MG tablet Take 1 tablet (325 mg total) by mouth daily with breakfast.  30 tablet  11  . furosemide (LASIX) 40 MG tablet Take 40 mg by mouth daily as needed. Swelling      . glipiZIDE (GLUCOTROL) 10 MG tablet Take 10 mg by mouth 2 (two) times daily.       Marland Kitchen. HYDROcodone-acetaminophen (NORCO) 5-325 MG per tablet Take 2 tablets by mouth every 6 (six) hours as needed for severe pain.  15 tablet  0  . insulin glargine (LANTUS) 100 UNIT/ML injection Inject 50 Units into the skin at bedtime. *ONLY takes when blood sugar levels exceed 200*      . levETIRAcetam (KEPPRA) 250 MG tablet Take 1 tablet (250 mg total) by mouth 2  (two) times daily.  60 tablet  11  . levothyroxine (SYNTHROID, LEVOTHROID) 137 MCG tablet Take 137 mcg by mouth daily before breakfast.      . loperamide (ANTI-DIARRHEAL) 2 MG capsule Take 2 mg by mouth every other day. Takes as needed for diarrhea      . LORazepam (ATIVAN) 1 MG tablet Take 1 mg by mouth 3 (three) times daily as needed. For anxiety      . metaxalone (SKELAXIN) 800 MG tablet Take 800 mg by mouth daily.       . metFORMIN (GLUCOPHAGE) 500 MG tablet Take 2,000 mg by mouth every morning.       . metoCLOPramide (REGLAN) 10 MG tablet Take 10 mg by mouth 4 (four) times daily.       . metoprolol (LOPRESSOR) 100 MG tablet Take 100 mg by mouth 2 (two) times daily.      . nitroGLYCERIN (NITROSTAT) 0.4 MG SL tablet Place 0.4 mg under the tongue as needed. For chest pain       . omeprazole (PRILOSEC) 20 MG capsule Take 20 mg by mouth 2 (two) times daily.        .Marland Kitchen  ondansetron (ZOFRAN) 8 MG tablet Take 8 mg by mouth daily as needed. nausea      . potassium chloride SA (K-DUR,KLOR-CON) 20 MEQ tablet Take 20 mEq by mouth 3 (three) times daily.       . simvastatin (ZOCOR) 40 MG tablet Take 40 mg by mouth at bedtime.       Marland Kitchen diltiazem (CARDIZEM CD) 240 MG 24 hr capsule Take 240 mg by mouth every evening.       No current facility-administered medications for this visit.    Discontinued Meds:    Medications Discontinued During This Encounter  Medication Reason  . warfarin (COUMADIN) 2.5 MG tablet Error  . levofloxacin (LEVAQUIN) 750 MG tablet Error    Patient Active Problem List   Diagnosis Date Noted  . Dyspnea 06/29/2013  . Tremor 10/27/2012  . Chronic anticoagulation 06/23/2011  . HYPOTENSION, ORTHOSTATIC 08/26/2009  . Hyperlipidemia 03/04/2009  . Barrett's esophagus 09/26/2008  . Gastroparesis 09/26/2008  . ANEMIA, IRON DEFICIENCY 09/25/2008  . HYPERTENSION 09/25/2008  . LUNG NODULE 09/25/2008  . Irritable bowel syndrome 09/25/2008  . SLEEP APNEA 09/25/2008  . CARPAL TUNNEL  SYNDROME, HX OF 09/25/2008  . HYPOTHYROIDISM, PRIMARY 04/10/2008  . DIABETES MELLITUS, TYPE II, ON INSULIN 04/10/2008  . OBESITY 04/10/2008  . Atrial fibrillation 04/10/2008  . CONGESTIVE HEART FAILURE, MILD 04/10/2008  . LOW BACK PAIN, CHRONIC 04/10/2008  . CHEST PAIN-PRECORDIAL 04/10/2008    LABS    Component Value Date/Time   NA 140 07/08/2013 2104   NA 138 07/08/2013 1754   NA 139 07/02/2013 0526   K 4.9 07/08/2013 2104   K 5.1 07/08/2013 1754   K 2.8* 07/02/2013 0526   CL 107 07/08/2013 2104   CL 103 07/08/2013 1754   CL 97 07/02/2013 0526   CO2 22 07/08/2013 2104   CO2 19 07/08/2013 1754   CO2 31 07/02/2013 0526   GLUCOSE 214* 07/08/2013 2104   GLUCOSE 266* 07/08/2013 1754   GLUCOSE 294* 07/02/2013 0526   BUN 11 07/08/2013 2104   BUN 11 07/08/2013 1754   BUN 11 07/02/2013 0526   CREATININE 0.63 07/08/2013 2104   CREATININE 0.61 07/08/2013 1754   CREATININE 0.66 07/02/2013 0526   CREATININE 0.80 04/03/2013 1244   CREATININE 0.76 04/04/2012 1033   CREATININE 1.58* 09/14/2011 1435   CALCIUM 8.2* 07/08/2013 2104   CALCIUM 9.0 07/08/2013 1754   CALCIUM 7.4* 07/02/2013 0526   CALCIUM 8.4 06/30/2013 0318   GFRNONAA >90 07/08/2013 2104   GFRNONAA >90 07/08/2013 1754   GFRNONAA >90 07/02/2013 0526   GFRAA >90 07/08/2013 2104   GFRAA >90 07/08/2013 1754   GFRAA >90 07/02/2013 0526   CMP     Component Value Date/Time   NA 140 07/08/2013 2104   K 4.9 07/08/2013 2104   CL 107 07/08/2013 2104   CO2 22 07/08/2013 2104   GLUCOSE 214* 07/08/2013 2104   BUN 11 07/08/2013 2104   CREATININE 0.63 07/08/2013 2104   CREATININE 0.80 04/03/2013 1244   CALCIUM 8.2* 07/08/2013 2104   CALCIUM 8.4 06/30/2013 0318   PROT 6.1 07/02/2013 0526   ALBUMIN 3.0* 07/02/2013 0526   AST 11 07/02/2013 0526   ALT 7 07/02/2013 0526   ALKPHOS 51 07/02/2013 0526   BILITOT 0.6 07/02/2013 0526   GFRNONAA >90 07/08/2013 2104   GFRAA >90 07/08/2013 2104       Component Value Date/Time   WBC 9.5 07/08/2013 1754   WBC 9.8 06/30/2013 0318   WBC 10.9*  06/29/2013 1718   HGB 10.4* 07/08/2013 1754   HGB 10.4* 06/30/2013 0318   HGB 10.7* 06/29/2013 1718   HCT 34.1* 07/08/2013 1754   HCT 33.8* 06/30/2013 0318   HCT 34.8* 06/29/2013 1718   MCV 88.1 07/08/2013 1754   MCV 89.7 06/30/2013 0318   MCV 89.7 06/29/2013 1718    Lipid Panel     Component Value Date/Time   CHOL 77 09/02/2011 0446   TRIG 88 09/02/2011 0446   HDL 35* 09/02/2011 0446   CHOLHDL 2.2 09/02/2011 0446   VLDL 18 09/02/2011 0446   LDLCALC 24 09/02/2011 0446    ABG    Component Value Date/Time   TCO2 18 02/11/2009 2057     Lab Results  Component Value Date   TSH 2.021 06/30/2013   BNP (last 3 results)  Recent Labs  06/15/13 1645 06/29/13 1718  PROBNP 3732.0* 5072.0*   Cardiac Panel (last 3 results) No results found for this basename: CKTOTAL, CKMB, TROPONINI, RELINDX,  in the last 72 hours  Iron/TIBC/Ferritin    Component Value Date/Time   IRON 34* 06/30/2013 0318   TIBC 415 06/30/2013 0318   FERRITIN 18 06/30/2013 0318     EKG Orders placed during the hospital encounter of 07/08/13  . ED EKG  . ED EKG  . EKG 12-LEAD  . EKG 12-LEAD  . EKG     Prior Assessment and Plan Problem List as of 07/29/2013   HYPOTHYROIDISM, PRIMARY   DIABETES MELLITUS, TYPE II, ON INSULIN   Last Assessment & Plan   09/01/2011 Clinical Support Written 09/01/2011 10:06 PM by Kathlen Brunswick, MD     Patient reports improvement in diabetic control with recent 40 pound weight loss.  She was congratulated on this achievement.    Hyperlipidemia   Last Assessment & Plan   12/26/2012 Office Visit Written 12/26/2012  3:30 PM by Kathlen Brunswick, MD     Extremely low total and LDL cholesterol. Current lipid-lowering therapy is effective and will be continued.    OBESITY   Last Assessment & Plan   09/01/2011 Clinical Support Written 09/01/2011 10:08 PM by Kathlen Brunswick, MD     Patient reports a 40 pound weight loss.  No weight contained in conjunction with today's visit.    ANEMIA, IRON  DEFICIENCY   Last Assessment & Plan   12/26/2012 Office Visit Written 12/27/2012  9:14 PM by Kathlen Brunswick, MD     Oral iron therapy started some months ago, but subsequently discontinued, apparently in error. This medication will be reordered. If hemoglobin does not recover towards normal, substitution of intravenous iron should be considered.    HYPERTENSION   Last Assessment & Plan   10/26/2012 Office Visit Written 10/27/2012 12:31 PM by Kathlen Brunswick, MD     Blood pressure control generally good over the past few months; patient will monitor at home and return to her next visit with a list of determinations.    Atrial fibrillation   Last Assessment & Plan   10/26/2012 Office Visit Written 10/27/2012 12:27 PM by Kathlen Brunswick, MD     Heart rate is an adequately controlled after moderate dose diltiazem discontinued and low-dose metoprolol started. I doubt that diltiazem is causing impairment of mental acuity, but treatment with metoprolol should be equally efficacious. Dose will be increased to a total of 75 mg per day and heart rate control reassessed in one week.    CONGESTIVE HEART FAILURE, MILD   Last  Assessment & Plan   09/22/2011 Office Visit Written 09/22/2011  4:49 PM by Jodelle Gross, NP     Will check a BNP and CXR today. She has some scattered crackles and has gain 5 lbs since being seen last. Try to avoid admission if we can diurese as OP if necessary. Close follow-up.    HYPOTENSION, ORTHOSTATIC   Last Assessment & Plan   10/26/2012 Office Visit Written 10/27/2012 12:31 PM by Kathlen Brunswick, MD     No orthostatic change in blood pressure at today's visit.    LUNG NODULE   Barrett's esophagus   Last Assessment & Plan   04/04/2012 Office Visit Written 04/04/2012  9:50 AM by West Bali, MD     IRREGULAR Z LINE ON EGD APR 2013. UNABLE TO APPRECIATE COLUMNAR EPITHELIUM EXTENDING ABOVE THE JXN.    Gastroparesis   Last Assessment & Plan   04/04/2012 Office Visit  Written 04/04/2012  9:51 AM by West Bali, MD     SX CONTROLLED WITH DIET.      Irritable bowel syndrome   Last Assessment & Plan   04/04/2012 Office Visit Written 04/04/2012  9:52 AM by West Bali, MD     SX FAIRLY WELL CONTROLLED.  TAKE IMODIUM MON WED FRI. OPV IN 6 MOS.    LOW BACK PAIN, CHRONIC   SLEEP APNEA   CHEST PAIN-PRECORDIAL   Last Assessment & Plan   09/01/2011 Clinical Support Written 09/03/2011  3:53 PM by Kathlen Brunswick, MD     No recent chest discomfort.    CARPAL TUNNEL SYNDROME, HX OF   Chronic anticoagulation   Last Assessment & Plan   10/26/2012 Office Visit Written 10/27/2012 12:30 PM by Kathlen Brunswick, MD     Mild anemia persists, likely do to chronic disease although previously characterized as iron deficiency. FOBT will be repeated.    Tremor   Last Assessment & Plan   12/26/2012 Office Visit Written 12/26/2012  3:31 PM by Kathlen Brunswick, MD     Tardive dyskinesia is more prominent than I have noted in the past. Metoclopramide may be contributing. Patient is scheduled for reassessment by Dr. Gerilyn Pilgrim.    Dyspnea       Imaging: Dg Chest 2 View  07/08/2013   CLINICAL DATA:  Shortness of breath and left shoulder region pain  EXAM: CHEST  2 VIEW  COMPARISON:  Chest radiograph and chest CT June 29, 2013  FINDINGS: There is persistent patchy infiltrate throughout the left lower lobe. There is mild atelectatic change in the right base. Elsewhere, lungs are clear. Heart is moderately enlarged with pulmonary vascularity within normal limits. There is atherosclerotic change in the aorta. No adenopathy. There is degenerative change in the thoracic spine.  IMPRESSION: Patchy infiltrate throughout the left lower lobe. Mild right base atelectasis. Stable cardiac enlargement.   Electronically Signed   By: Bretta Bang M.D.   On: 07/08/2013 19:24   Ct Angio Chest Pe W/cm &/or Wo Cm  06/29/2013   CLINICAL DATA:  Shortness of breath, weakness and chest  pain. History of smoking.  EXAM: CT ANGIOGRAPHY CHEST WITH CONTRAST  TECHNIQUE: Multidetector CT imaging of the chest was performed using the standard protocol during bolus administration of intravenous contrast. Multiplanar CT image reconstructions including MIPs were obtained to evaluate the vascular anatomy.  CONTRAST:  OMNIPAQUE IOHEXOL 350 MG/ML SOLN  COMPARISON:  Chest radiograph performed earlier today at 05:12 p.m., and CTA of the  chest performed 10/16/2006  FINDINGS: There is no evidence of pulmonary embolus.  Patchy airspace opacity is noted within the left lung, and minimal right-sided airspace opacity is seen, with underlying interstitial prominence. The appearance is more suggestive of atelectasis, though mild pneumonia might have such an appearance. There is no evidence of pleural effusion or pneumothorax. No masses are identified; no abnormal focal contrast enhancement is seen.  There is bilateral prominence of the pulmonary arteries, with tapering seen distally, raising concern for mild pulmonary arterial hypertension. Mild biatrial enlargement is noted. Calcification is seen at the mitral valve. The mediastinum is otherwise unremarkable in appearance. No mediastinal lymphadenopathy is seen. The great vessel grossly unremarkable. No pericardial effusion is identified. No axillary lymphadenopathy is seen. The thyroid gland is not visualized.  There is reflux of contrast into the IVC and hepatic veins. The visualized portions of the spleen are unremarkable. There is mild diffuse fatty infiltration involving the pancreas.  No acute osseous abnormalities are seen.  Review of the MIP images confirms the above findings.  IMPRESSION: 1. No evidence of pulmonary embolus. 2. Patchy airspace opacity within the left lung, and minimal right-sided airspace opacity, with underlying interstitial prominence. The appearance is more suggestive of atelectasis, though mild pneumonia might have such an appearance.  3. Prominence of the pulmonary arteries, with apparent distal tapering, raising concern for mild pulmonary arterial hypertension. Mild biatrial enlargement noted. Calcification seen at the mitral valve. 4. Reflux of contrast incidentally noted into the IVC and hepatic veins.   Electronically Signed   By: Roanna Raider M.D.   On: 06/29/2013 21:18   Dg Chest Portable 1 View  06/29/2013   CLINICAL DATA:  Feels like heart is ray seen.  EXAM: PORTABLE CHEST - 1 VIEW  COMPARISON:  06/15/2013  FINDINGS: There is moderate enlargement of the cardiopericardial silhouette. The mediastinum is normal in contour. No convincing hilar masses.  There is opacity that extends laterally from the left hilum, likely atelectasis. Infiltrate is not excluded.  Prominent bronchovascular markings are noted bilaterally which are stable. No pulmonary edema. No pleural effusion or pneumothorax is seen.  The bony thorax is demineralized but grossly intact.  IMPRESSION: No convincing acute cardiopulmonary disease. Left mid lung opacity is likely atelectasis. Moderate cardiomegaly.   Electronically Signed   By: Amie Portland M.D.   On: 06/29/2013 17:27   Dg Shoulder Left  07/11/2013   CLINICAL DATA:  Pain  EXAM: LEFT SHOULDER - 2+ VIEW  COMPARISON:  August 24, 2009  FINDINGS: Frontal, Y scapular, and tilt oblique images were obtained. There is no fracture or dislocation. There is mild narrowing of the glenohumeral joint. No erosive change or intra-articular calcification.  There is patchy infiltrate in the left lower lobe region.  IMPRESSION: Mild osteoarthritic change. No fracture or dislocation. Incidental note is made of patchy infiltrate in the left lower lobe.   Electronically Signed   By: Bretta Bang M.D.   On: 07/11/2013 17:59

## 2013-07-29 NOTE — Assessment & Plan Note (Signed)
Blood pressure is well controlled. No changes in her medications at this time.

## 2013-07-29 NOTE — Progress Notes (Signed)
HPI: [name] a 63 year old patient to be assigned to Dr. Wyline [name] we are following for ongoing assessment and management of hypertension, history of hyperlipidemia, atrial fibrillation with chronic anticoagulation, and episodic CHF. Patient was last seen in the office by Dr. Dietrich [name] in June 2014. Her history includes iron deficiency anemia and Barrett's esophagus . At that time no medication changes were made, no chest were planned. She was continued on current medical management.   Since being seen last the patient was seen in the ER for shortness of breath and hypertension shortness of breath. She was treated for viral bronchitis, was continued treatment for this with a Z-Pak which she had been placed on prior to ER visit and since and symptomatic relief. She is return visit on 06/16/2013 for epistaxis. She was to followup with ENT. She was not found to be anemic. Coumadin dose was adjusted. She was given cocaine packing.   On 06/29/2013 the patient was admitted to [facility] in the setting of acute respiratory distress, A. fib RVR. Echocardiogram was completed during that time with an EF of 45-50% with diffuse hypokinesis. She was started on diltiazem drip, and then transitioned to diltiazem 30 mg every 6 hours. Prior to discharge she was switched to diltiazem 180 mg daily. She is also continued on metoprolol and Coumadin.    She came back to the hospital ED on 15 2015 secondary to shortness of breath and arm pain. The patient was not admitted, was found to be hyperglycemic with glucose of 214. She is continued on current medical management.   She is without complaint. She is out of diltiazem. Needs to have refills. She denies chest pain or DOE. No pain or edema.  Allergies  Allergen Reactions  . Celexa [Citalopram Hydrobromide]   . Codeine Nausea And Vomiting    HALLUCINATIONS    Current Outpatient Prescriptions  Medication Sig Dispense Refill  . acetaminophen (TYLENOL ARTHRITIS  PAIN) 650 MG CR tablet Take 650 mg by mouth every 8 (eight) hours as needed for pain.      Marland Kitchen apixaban (ELIQUIS) 5 MG TABS tablet Take 1 tablet (5 mg total) by mouth 2 (two) times daily.  60 tablet  3  . ferrous sulfate 325 (65 FE) MG tablet Take 1 tablet (325 mg total) by mouth daily with breakfast.  30 tablet  11  . furosemide (LASIX) 40 MG tablet Take 40 mg by mouth daily as needed. Swelling      . glipiZIDE (GLUCOTROL) 10 MG tablet Take 10 mg by mouth 2 (two) times daily.       Marland Kitchen HYDROcodone-acetaminophen (NORCO) 5-325 MG per tablet Take 2 tablets by mouth every 6 (six) hours as needed for severe pain.  15 tablet  0  . insulin glargine (LANTUS) 100 UNIT/ML injection Inject 50 Units into the skin at bedtime. *ONLY takes when blood sugar levels exceed 200*      . levETIRAcetam (KEPPRA) 250 MG tablet Take 1 tablet (250 mg total) by mouth 2 (two) times daily.  60 tablet  11  . levothyroxine (SYNTHROID, LEVOTHROID) 137 MCG tablet Take 137 mcg by mouth daily before breakfast.      . loperamide (ANTI-DIARRHEAL) 2 MG capsule Take 2 mg by mouth every other day. Takes as needed for diarrhea      . LORazepam (ATIVAN) 1 MG tablet Take 1 mg by mouth 3 (three) times daily as needed. For anxiety      . metaxalone (SKELAXIN) 800 MG  tablet Take 800 mg by mouth daily.       . metFORMIN (GLUCOPHAGE) 500 MG tablet Take 2,000 mg by mouth every morning.       . metoCLOPramide (REGLAN) 10 MG tablet Take 10 mg by mouth 4 (four) times daily.       . metoprolol (LOPRESSOR) 100 MG tablet Take 100 mg by mouth 2 (two) times daily.      . nitroGLYCERIN (NITROSTAT) 0.4 MG SL tablet Place 0.4 mg under the tongue as needed. For chest pain       . omeprazole (PRILOSEC) 20 MG capsule Take 20 mg by mouth 2 (two) times daily.        . ondansetron (ZOFRAN) 8 MG tablet Take 8 mg by mouth daily as needed. nausea      . potassium chloride SA (K-DUR,KLOR-CON) 20 MEQ tablet Take 20 mEq by mouth 3 (three) times daily.       .  simvastatin (ZOCOR) 40 MG tablet Take 40 mg by mouth at bedtime.       Marland Kitchen diltiazem (CARDIZEM CD) 240 MG 24 hr capsule Take 240 mg by mouth every evening.       No current facility-administered medications for this visit.    Past Medical History  Diagnosis Date  . Chest pain     Negative cardiac catheterization in 2002; negative stress nuclear study in 2008  . Congestive heart failure     Normal LV systolic function  . PAF (paroxysmal atrial fibrillation) 2009  . Chronic anticoagulation   . Diabetes mellitus, type 2     Insulin therapy; exacerbated by prednisone  . Syncope     Admitted 05/2009; magnetic resonance imagin/ MRA - negative; etiology thought to be orthostasis secondary to drugs and dehydration  . GERD (gastroesophageal reflux disease)   . IBS (irritable bowel syndrome)   . Hiatal hernia   . Gastroparesis     99% retention 05/2008 on GES  . Hyperlipidemia   . Hypertension   . Hypothyroid   . Chronic LBP     Surgical intervention in 1996  . Obesity   . OSA on CPAP   . Anemia     H/H of 10/30 with a normal MCV in 12/09  . Barrett's esophagus     Diagnosed 1995. Last EGD 2008.   . Seizures     Past Surgical History  Procedure Laterality Date  . Cardiovascular stress test  2008    Stress nuclear study  . Back surgery  1996  . Laparoscopic cholecystectomy  1990s  . Total abdominal hysterectomy  1999  . Laminectomy  1995    L4-L5  . Carpal tunnel release  1994  . Esophagogastroduodenoscopy  2008    Barrett's without dysplasia. esphagus dilated. antral erosions, h.pylori serologies negative.  . Colonoscopy  2013    Patient reports normal TCS around 10 years ago; and a repeat study in 2013 that was negative  . Givens capsule study  12/07/2011    Procedure: GIVENS CAPSULE STUDY;  Surgeon: Tara Bali, MD;  Location: [facility];  Service: Endoscopy;  Laterality: N/A;  . Cardiac catheterization  2002    HYQ:MVHQIO sinus rhythm  PHYSICAL EXAM BP 119/55   Pulse 97  Ht 5\' 8"  (1.727 m)  Wt 215 lb (97.523 kg)  BMI 32.70 kg/m2  General: Well developed, well nourished, in no acute distress, frail Head: Eyes PERRLA, No xanthomas.   Normal cephalic and atramatic  Lungs: Clear bilaterally to auscultation  and percussion. Heart: HRIR S1 S2, without MRG.  Pulses are 2+ & equal.            No carotid bruit. No JVD.  No abdominal bruits. No femoral bruits. Abdomen: Bowel sounds are positive, abdomen soft and non-tender without masses or                  Hernia's noted. Msk:  Back normal, normal gait. Normal strength and tone for age. Extremities: No clubbing, cyanosis, non-pitting edema.  DP +1 Neuro: Alert and oriented X 3. Psych:  Flat affect, responds appropriately  :  ASSESSMENT AND PLAN

## 2013-07-29 NOTE — Patient Instructions (Signed)
Your physician recommends that you schedule a follow-up appointment in: 4 months with Dr Suszanne Finch will receive a reminder letter two months in advance reminding you to call and schedule your appointment. If you don't receive this letter, please contact our office.  Your physician recommends that you continue on your current medications as directed. Please refer to the Current Medication list given to you today.

## 2013-08-08 ENCOUNTER — Telehealth (HOSPITAL_COMMUNITY): Payer: Self-pay

## 2013-08-08 ENCOUNTER — Ambulatory Visit (HOSPITAL_COMMUNITY): Payer: Medicare Other | Admitting: Specialist

## 2013-08-23 ENCOUNTER — Ambulatory Visit: Payer: Medicare Other | Admitting: Gastroenterology

## 2013-08-26 ENCOUNTER — Ambulatory Visit (INDEPENDENT_AMBULATORY_CARE_PROVIDER_SITE_OTHER): Payer: Medicare Other | Admitting: *Deleted

## 2013-08-26 ENCOUNTER — Encounter: Payer: Self-pay | Admitting: Gastroenterology

## 2013-08-26 ENCOUNTER — Ambulatory Visit (INDEPENDENT_AMBULATORY_CARE_PROVIDER_SITE_OTHER): Payer: Medicare Other | Admitting: Gastroenterology

## 2013-08-26 VITALS — BP 109/71 | HR 75 | Temp 97.6°F | Ht 69.0 in | Wt 205.6 lb

## 2013-08-26 DIAGNOSIS — F5000 Anorexia nervosa, unspecified: Secondary | ICD-10-CM | POA: Insufficient documentation

## 2013-08-26 DIAGNOSIS — Z5181 Encounter for therapeutic drug level monitoring: Secondary | ICD-10-CM | POA: Insufficient documentation

## 2013-08-26 DIAGNOSIS — R109 Unspecified abdominal pain: Secondary | ICD-10-CM

## 2013-08-26 DIAGNOSIS — R197 Diarrhea, unspecified: Secondary | ICD-10-CM

## 2013-08-26 DIAGNOSIS — R634 Abnormal weight loss: Secondary | ICD-10-CM

## 2013-08-26 DIAGNOSIS — I4891 Unspecified atrial fibrillation: Secondary | ICD-10-CM

## 2013-08-26 DIAGNOSIS — R1030 Lower abdominal pain, unspecified: Secondary | ICD-10-CM | POA: Insufficient documentation

## 2013-08-26 DIAGNOSIS — K589 Irritable bowel syndrome without diarrhea: Secondary | ICD-10-CM

## 2013-08-26 DIAGNOSIS — D509 Iron deficiency anemia, unspecified: Secondary | ICD-10-CM

## 2013-08-26 DIAGNOSIS — K3184 Gastroparesis: Secondary | ICD-10-CM

## 2013-08-26 LAB — CBC WITH DIFFERENTIAL/PLATELET
Basophils Absolute: 0 10*3/uL (ref 0.0–0.1)
Basophils Relative: 0 % (ref 0–1)
EOS ABS: 0.1 10*3/uL (ref 0.0–0.7)
Eosinophils Relative: 1 % (ref 0–5)
HCT: 40.7 % (ref 36.0–46.0)
HEMOGLOBIN: 12.7 g/dL (ref 12.0–15.0)
LYMPHS ABS: 3 10*3/uL (ref 0.7–4.0)
LYMPHS PCT: 40 % (ref 12–46)
MCH: 26.4 pg (ref 26.0–34.0)
MCHC: 31.2 g/dL (ref 30.0–36.0)
MCV: 84.6 fL (ref 78.0–100.0)
MONOS PCT: 7 % (ref 3–12)
Monocytes Absolute: 0.5 10*3/uL (ref 0.1–1.0)
NEUTROS PCT: 52 % (ref 43–77)
Neutro Abs: 4 10*3/uL (ref 1.7–7.7)
Platelets: 264 10*3/uL (ref 150–400)
RBC: 4.81 MIL/uL (ref 3.87–5.11)
RDW: 17.1 % — ABNORMAL HIGH (ref 11.5–15.5)
WBC: 7.6 10*3/uL (ref 4.0–10.5)

## 2013-08-26 LAB — BASIC METABOLIC PANEL WITH GFR
BUN: 14 mg/dL (ref 6–23)
CHLORIDE: 108 meq/L (ref 96–112)
CO2: 21 mEq/L (ref 19–32)
Calcium: 9.6 mg/dL (ref 8.4–10.5)
Creat: 0.8 mg/dL (ref 0.50–1.10)
GFR, EST NON AFRICAN AMERICAN: 78 mL/min
Glucose, Bld: 190 mg/dL — ABNORMAL HIGH (ref 70–99)
POTASSIUM: 5.1 meq/L (ref 3.5–5.3)
SODIUM: 138 meq/L (ref 135–145)

## 2013-08-26 LAB — CREATININE, SERUM: Creat: 0.78 mg/dL (ref 0.50–1.10)

## 2013-08-26 NOTE — Progress Notes (Signed)
Primary Care Physician: [name], MD  Primary Gastroenterologist:  Jonette Eva, MD   Chief Complaint  Patient presents with  . Abdominal Pain    HPI: [name] is a 63 y.o. female here for further evaluation of lower abdominal pain, diarrhea, abnormal weight loss.  Last seen October 2013. Weight is down 25 pounds since that time. History of Barrett's esophagitis, gastroparesis, GERD, IBS. Also with history of iron deficiency anemia. Small bowel Givens capsule study done June 2013 showed occasional AVM, multiple ulcers in the distal small bowel. Advised to discontinue NSAIDs. Started on iron therapy. Admits that she no longer takes her iron. Also on Coumadin at that time. Currently on Eliquis, started last month.  Complains of severe lower abdominal pain, crampy. Nothing seems to make pain better or worse. Noted first about four weeks ago. Associated with lots of gas. BM 3-4 per day. May have day or so each week with no BMs. Takes imodium two tablets every afternoon. Worried about cancer. No melena, brbpr. Little bit of heartburn. Watching foods. Poor appetite. No swallowing concerns. Nausea without vomiting. Having difficult time taking her medications properly. Daughter is preparing her medications now.   Denies dysuria. Currently on Z-pak for respiratory issues. Complains of fatigue.   Current Outpatient Prescriptions  Medication Sig Dispense Refill  . apixaban (ELIQUIS) 5 MG TABS tablet Take 1 tablet (5 mg total) by mouth 2 (two) times daily.  60 tablet  3  . azithromycin (ZITHROMAX) 250 MG tablet       . diltiazem (CARDIZEM CD) 240 MG 24 hr capsule Take 240 mg by mouth every evening.      . furosemide (LASIX) 40 MG tablet Take 40 mg by mouth daily as needed. Swelling      . glipiZIDE (GLUCOTROL) 10 MG tablet Take 10 mg by mouth 2 (two) times daily.       Marland Kitchen HYDROcodone-acetaminophen (NORCO) 10-325 MG per tablet Take 1 tablet by mouth as needed.      . insulin  glargine (LANTUS) 100 UNIT/ML injection Inject 50 Units into the skin at bedtime. *ONLY takes when blood sugar levels exceed 200*      . levETIRAcetam (KEPPRA) 250 MG tablet Take 1 tablet (250 mg total) by mouth 2 (two) times daily.  60 tablet  11  . levothyroxine (SYNTHROID, LEVOTHROID) 137 MCG tablet Take 125 mcg by mouth daily before breakfast.       . loperamide (IMODIUM) 2 MG capsule Take 4 mg by mouth once.      Marland Kitchen LORazepam (ATIVAN) 1 MG tablet Take 1 mg by mouth 3 (three) times daily as needed. For anxiety      . metFORMIN (GLUCOPHAGE) 500 MG tablet Take 2,000 mg by mouth every morning.       . metoCLOPramide (REGLAN) 10 MG tablet Take 10 mg by mouth 4 (four) times daily.       . metoprolol (LOPRESSOR) 100 MG tablet Take 100 mg by mouth 2 (two) times daily.      . nitroGLYCERIN (NITROSTAT) 0.4 MG SL tablet Place 0.4 mg under the tongue as needed. For chest pain       . omeprazole (PRILOSEC) 20 MG capsule Take 20 mg by mouth 2 (two) times daily.        . ondansetron (ZOFRAN) 8 MG tablet Take 8 mg by mouth daily as needed. nausea      . potassium chloride SA (K-DUR,KLOR-CON) 20 MEQ tablet Take 20 mEq  by mouth 3 (three) times daily.       . simvastatin (ZOCOR) 40 MG tablet Take 40 mg by mouth at bedtime.        No current facility-administered medications for this visit.    Allergies as of 08/26/2013 - Review Complete 08/26/2013  Allergen Reaction Noted  . Celexa [citalopram hydrobromide]  06/30/2013  . Codeine Nausea And Vomiting    Past Medical History  Diagnosis Date  . Chest pain     Negative cardiac catheterization in 2002; negative stress nuclear study in 2008  . Congestive heart failure     Normal LV systolic function  . PAF (paroxysmal atrial fibrillation) 2009  . Chronic anticoagulation   . Diabetes mellitus, type 2     Insulin therapy; exacerbated by prednisone  . Syncope     Admitted 05/2009; magnetic resonance imagin/ MRA - negative; etiology thought to be  orthostasis secondary to drugs and dehydration  . GERD (gastroesophageal reflux disease)   . IBS (irritable bowel syndrome)   . Hiatal hernia   . Gastroparesis     99% retention 05/2008 on GES  . Hyperlipidemia   . Hypertension   . Hypothyroid   . Chronic LBP     Surgical intervention in 1996  . Obesity   . OSA on CPAP   . Anemia     H/H of 10/30 with a normal MCV in 12/09  . Barrett's esophagus     Diagnosed 1995. Last EGD 2008.   . Seizures    Past Surgical History  Procedure Laterality Date  . Cardiovascular stress test  2008    Stress nuclear study  . Back surgery  1996  . Laparoscopic cholecystectomy  1990s  . Total abdominal hysterectomy  1999  . Laminectomy  1995    L4-L5  . Carpal tunnel release  1994  . Esophagogastroduodenoscopy  2008    Barrett's without dysplasia. esphagus dilated. antral erosions, h.pylori serologies negative.  . Colonoscopy  11/2011    Dr. Darrick Pennafields: Internal hemorrhoids, mild diverticulosis. Random colon biopsies negative.  . Givens capsule study  12/07/2011    Proximal small bowel, rare AVM. Distal small bowel, multiple ulcers noted  . Cardiac catheterization  2002  . Esophagogastroduodenoscopy  11/2011    Dr. Darrick PennaFields: Barrett's esophagus, mild gastritis, diverticulum in the second portion of the duodenum repeat EGD 3 years. Small bowel biopsies negative. Gastric biopsy show reactive gastropathy but no H. pylori. Esophageal biopsies consistent with GERD.   Family History  Problem Relation Age of Onset  . Hypertension Mother   . Alzheimer's disease Mother   . Stroke Mother   . Heart attack Mother   . Heart disease Neg Hx   . Hypertension Other   . Colon cancer Neg Hx    History   Social History  . Marital Status: Married    Spouse Name: N/A    Number of Children: N/A  . Years of Education: N/A   Occupational History  . 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     Social History Main Topics  . Smoking status: Former Smoker -- 15  years    Quit date: 07/01/1983  . Smokeless tobacco: Never Used     Comment: Remote  . Alcohol Use: No     Comment: last etoh one year ago, never frequent  . Drug Use: No  . Sexual Activity: Yes    Birth Control/ Protection: None, Surgical   Other Topics Concern  .  None   Social History Narrative   Sedentary   4 children, "blended family"    ROS:  General: Negative for fever, chills. See hpi. ENT: Negative for hoarseness, difficulty swallowing, nasal congestion. CV: Negative for chest pain, angina, palpitations, dyspnea on exertion, peripheral edema.  Respiratory: Negative for dyspnea at rest, dyspnea on exertion, cough, sputum, wheezing.  GI: See history of present illness. GU:  Negative for dysuria, hematuria, urinary incontinence, urinary frequency, nocturnal urination.  Endo: see hpi   Physical Examination:   BP 109/71  Pulse 75  Temp(Src) 97.6 F (36.4 C) (Oral)  Ht 5\' 9"  (1.753 m)  Wt 205 lb 9.6 oz (93.26 kg)  BMI 30.35 kg/m2  General: chronically ill-appearing WF in no acute distress. Accompanied by daughter. Eyes: No icterus. Mouth: Oropharyngeal mucosa moist and pink, no lesions erythema or exudate. Lungs: Clear to auscultation bilaterally.  Heart: Regular rate and rhythm, no murmurs rubs or gallops.  Abdomen: Bowel sounds are normal, moderate diffuse lower abdominal tenderness, nondistended, no hepatosplenomegaly or masses, no abdominal bruits or hernia , no rebound or guarding.   Extremities: trace to 1+ lower extremity edema. No clubbing or deformities. Neuro: Alert and oriented x 4   Skin: Warm and dry, no jaundice.   Psych: Alert and cooperative, normal mood and affect.  Labs:  Lab Results  Component Value Date   WBC 9.5 07/08/2013   HGB 10.4* 07/08/2013   HCT 34.1* 07/08/2013   MCV 88.1 07/08/2013   PLT 257 07/08/2013   Lab Results  Component Value Date   IRON 34* 06/30/2013   TIBC 415 06/30/2013   FERRITIN 18 06/30/2013   Lab Results  Component  Value Date   VITAMINB12 701 06/30/2013   Lab Results  Component Value Date   FOLATE >20.0 06/30/2013   Lab Results  Component Value Date   CREATININE 0.63 07/08/2013   BUN 11 07/08/2013   NA 140 07/08/2013   K 4.9 07/08/2013   CL 107 07/08/2013   CO2 22 07/08/2013   Lab Results  Component Value Date   ALT 7 07/02/2013   AST 11 07/02/2013   ALKPHOS 51 07/02/2013   BILITOT 0.6 07/02/2013    Imaging Studies: No results found.

## 2013-08-26 NOTE — Patient Instructions (Signed)
1. Please have your blood work done. 2. Started back on your iron, ferrous sulfate 325 mg one in the morning and one at night. 3. CAT scan of your abdomen is scheduled. Further recommendations to follow once we receive those results. 4. Please send a stool specimen to the lab for C. difficile.

## 2013-08-26 NOTE — Assessment & Plan Note (Signed)
25-30 pound unintentional weight loss. Patient complains of anorexia. Continues to have diarrhea, chronic, maybe slightly worse over the past couple of weeks. Biggest concern of new lower abdominal pain over the past four weeks. Denies melena, brbpr. She has chronic IDA but does not take her iron. H/O small bowel ulcer on Givens capsule in 2013, ?NSAIDs related.   At this time, we need to rule out superimposed C.Diff given her multiple hospitalizations and recent antibiotic use. For her weight loss, abdominal pain, will pursue CT A/P to rule out underlying malignancy and look at bowel for any evidence of IBD given h/o SB ulcers. Based on findings, she may require repeat endoscopy/colonoscopy for ongoing symptoms.   She continue to have frequent nausea, anorexia, but no vomiting. States she is taking reglan QAC/QHS and Zofran twice per day. Await pending working.

## 2013-08-26 NOTE — Patient Instructions (Addendum)
Pt here for 1 month Eliquis follow up.  CBC and BMP drawn on 08/26/13 and stable.  BUN 14, Cr. 0.80, CrCl 104.69, Hgb 12.7, Hct 40.7, Wt: 205.8 Pt tolerating Eliquis well. Will continue Eliquis 5mg  bid.   Denies any S/S of bleeding, increased bruising or unusual GI upset.   Follow up in 6 months.

## 2013-08-27 LAB — CLOSTRIDIUM DIFFICILE BY PCR: Toxigenic C. Difficile by PCR: NOT DETECTED

## 2013-08-28 NOTE — Progress Notes (Signed)
Quick Note:  Called and informed pt's daughter, [name]. Pt is to have her CT on Friday. ______

## 2013-08-28 NOTE — Progress Notes (Signed)
cc'd to pcp 

## 2013-08-30 ENCOUNTER — Ambulatory Visit (HOSPITAL_COMMUNITY): Payer: Medicare Other

## 2013-08-30 ENCOUNTER — Encounter: Payer: Self-pay | Admitting: *Deleted

## 2013-09-02 ENCOUNTER — Other Ambulatory Visit: Payer: Self-pay | Admitting: Cardiovascular Disease

## 2013-09-03 ENCOUNTER — Ambulatory Visit (HOSPITAL_COMMUNITY)
Admission: RE | Admit: 2013-09-03 | Discharge: 2013-09-03 | Disposition: A | Discharge status: Home / Self Care | Payer: Medicare Other | Source: Ambulatory Visit | Attending: Gastroenterology | Admitting: Gastroenterology

## 2013-09-03 DIAGNOSIS — D509 Iron deficiency anemia, unspecified: Secondary | ICD-10-CM

## 2013-09-03 DIAGNOSIS — K589 Irritable bowel syndrome without diarrhea: Secondary | ICD-10-CM | POA: Insufficient documentation

## 2013-09-03 DIAGNOSIS — M47817 Spondylosis without myelopathy or radiculopathy, lumbosacral region: Secondary | ICD-10-CM | POA: Insufficient documentation

## 2013-09-03 DIAGNOSIS — K3184 Gastroparesis: Secondary | ICD-10-CM

## 2013-09-03 DIAGNOSIS — J9819 Other pulmonary collapse: Secondary | ICD-10-CM | POA: Insufficient documentation

## 2013-09-03 DIAGNOSIS — R109 Unspecified abdominal pain: Secondary | ICD-10-CM | POA: Insufficient documentation

## 2013-09-03 DIAGNOSIS — F5000 Anorexia nervosa, unspecified: Secondary | ICD-10-CM

## 2013-09-03 DIAGNOSIS — K5730 Diverticulosis of large intestine without perforation or abscess without bleeding: Secondary | ICD-10-CM | POA: Insufficient documentation

## 2013-09-03 DIAGNOSIS — R1030 Lower abdominal pain, unspecified: Secondary | ICD-10-CM

## 2013-09-03 DIAGNOSIS — E119 Type 2 diabetes mellitus without complications: Secondary | ICD-10-CM | POA: Insufficient documentation

## 2013-09-03 DIAGNOSIS — R197 Diarrhea, unspecified: Secondary | ICD-10-CM

## 2013-09-03 DIAGNOSIS — R634 Abnormal weight loss: Secondary | ICD-10-CM

## 2013-09-03 DIAGNOSIS — I517 Cardiomegaly: Secondary | ICD-10-CM | POA: Insufficient documentation

## 2013-09-03 DIAGNOSIS — K429 Umbilical hernia without obstruction or gangrene: Secondary | ICD-10-CM | POA: Insufficient documentation

## 2013-09-03 MED ORDER — IOHEXOL 300 MG/ML  SOLN
100.0000 mL | Freq: Once | INTRAMUSCULAR | Status: AC | PRN
  Administered 2013-09-03: 100 mL via INTRAVENOUS

## 2013-09-05 ENCOUNTER — Encounter (HOSPITAL_COMMUNITY): Payer: Self-pay | Admitting: Emergency Medicine

## 2013-09-05 ENCOUNTER — Emergency Department (HOSPITAL_COMMUNITY)
Admission: EM | Admit: 2013-09-05 | Discharge: 2013-09-05 | Disposition: A | Discharge status: Home / Self Care | Payer: Medicare Other | Attending: Emergency Medicine | Admitting: Emergency Medicine

## 2013-09-05 DIAGNOSIS — G40909 Epilepsy, unspecified, not intractable, without status epilepticus: Secondary | ICD-10-CM | POA: Insufficient documentation

## 2013-09-05 DIAGNOSIS — R1032 Left lower quadrant pain: Secondary | ICD-10-CM | POA: Insufficient documentation

## 2013-09-05 DIAGNOSIS — I10 Essential (primary) hypertension: Secondary | ICD-10-CM | POA: Insufficient documentation

## 2013-09-05 DIAGNOSIS — R109 Unspecified abdominal pain: Secondary | ICD-10-CM

## 2013-09-05 DIAGNOSIS — E119 Type 2 diabetes mellitus without complications: Secondary | ICD-10-CM | POA: Insufficient documentation

## 2013-09-05 DIAGNOSIS — Z87891 Personal history of nicotine dependence: Secondary | ICD-10-CM | POA: Insufficient documentation

## 2013-09-05 DIAGNOSIS — Z9981 Dependence on supplemental oxygen: Secondary | ICD-10-CM | POA: Insufficient documentation

## 2013-09-05 DIAGNOSIS — E039 Hypothyroidism, unspecified: Secondary | ICD-10-CM | POA: Insufficient documentation

## 2013-09-05 DIAGNOSIS — I4891 Unspecified atrial fibrillation: Secondary | ICD-10-CM | POA: Insufficient documentation

## 2013-09-05 DIAGNOSIS — R1031 Right lower quadrant pain: Secondary | ICD-10-CM | POA: Insufficient documentation

## 2013-09-05 DIAGNOSIS — E785 Hyperlipidemia, unspecified: Secondary | ICD-10-CM | POA: Insufficient documentation

## 2013-09-05 DIAGNOSIS — E669 Obesity, unspecified: Secondary | ICD-10-CM | POA: Insufficient documentation

## 2013-09-05 DIAGNOSIS — Z7901 Long term (current) use of anticoagulants: Secondary | ICD-10-CM | POA: Insufficient documentation

## 2013-09-05 DIAGNOSIS — Z79899 Other long term (current) drug therapy: Secondary | ICD-10-CM | POA: Insufficient documentation

## 2013-09-05 DIAGNOSIS — K219 Gastro-esophageal reflux disease without esophagitis: Secondary | ICD-10-CM | POA: Insufficient documentation

## 2013-09-05 DIAGNOSIS — G8929 Other chronic pain: Secondary | ICD-10-CM | POA: Insufficient documentation

## 2013-09-05 DIAGNOSIS — I509 Heart failure, unspecified: Secondary | ICD-10-CM | POA: Insufficient documentation

## 2013-09-05 DIAGNOSIS — Z7902 Long term (current) use of antithrombotics/antiplatelets: Secondary | ICD-10-CM | POA: Insufficient documentation

## 2013-09-05 DIAGNOSIS — G4733 Obstructive sleep apnea (adult) (pediatric): Secondary | ICD-10-CM | POA: Insufficient documentation

## 2013-09-05 LAB — COMPREHENSIVE METABOLIC PANEL
ALT: 8 U/L (ref 0–35)
AST: 12 U/L (ref 0–37)
Albumin: 3.4 g/dL — ABNORMAL LOW (ref 3.5–5.2)
Alkaline Phosphatase: 44 U/L (ref 39–117)
BUN: 10 mg/dL (ref 6–23)
CALCIUM: 9 mg/dL (ref 8.4–10.5)
CO2: 25 meq/L (ref 19–32)
CREATININE: 0.58 mg/dL (ref 0.50–1.10)
Chloride: 108 mEq/L (ref 96–112)
GFR calc Af Amer: >90 mL/min (ref >90)
Glucose, Bld: 174 mg/dL — ABNORMAL HIGH (ref 70–99)
Potassium: 4.9 mEq/L (ref 3.7–5.3)
Sodium: 143 mEq/L (ref 137–147)
TOTAL PROTEIN: 6.6 g/dL (ref 6.0–8.3)
Total Bilirubin: 0.2 mg/dL — ABNORMAL LOW (ref 0.3–1.2)

## 2013-09-05 LAB — CBC WITH DIFFERENTIAL/PLATELET
BASOS ABS: 0 10*3/uL (ref 0.0–0.1)
BASOS PCT: 0 % (ref 0–1)
EOS ABS: 0.1 10*3/uL (ref 0.0–0.7)
EOS PCT: 2 % (ref 0–5)
HCT: 34.8 % — ABNORMAL LOW (ref 36.0–46.0)
Hemoglobin: 10.9 g/dL — ABNORMAL LOW (ref 12.0–15.0)
LYMPHS PCT: 39 % (ref 12–46)
Lymphs Abs: 2.3 10*3/uL (ref 0.7–4.0)
MCH: 27.1 pg (ref 26.0–34.0)
MCHC: 31.3 g/dL (ref 30.0–36.0)
MCV: 86.6 fL (ref 78.0–100.0)
MONO ABS: 0.5 10*3/uL (ref 0.1–1.0)
Monocytes Relative: 9 % (ref 3–12)
Neutro Abs: 2.9 10*3/uL (ref 1.7–7.7)
Neutrophils Relative %: 49 % (ref 43–77)
Platelets: 184 10*3/uL (ref 150–400)
RBC: 4.02 MIL/uL (ref 3.87–5.11)
RDW: 17 % — AB (ref 11.5–15.5)
WBC: 5.8 10*3/uL (ref 4.0–10.5)

## 2013-09-05 MED ORDER — HYDROCODONE-ACETAMINOPHEN 5-325 MG PO TABS: 1.0000 | ORAL_TABLET | Freq: Four times a day (QID) | ORAL | Status: DC | PRN

## 2013-09-05 NOTE — ED Provider Notes (Signed)
CSN: [id number]     Arrival date & time 09/05/13  1524 History  This chart was scribed for Benny LennertJoseph L Alastor Kneale, MD,  by Ashley JacobsBrittany Andrews, ED Scribe and Bennett Scrapehristina Taylor, ED Scribe. The patient was seen in room APA12/APA12 and the patient's care was started at 3:48 PM.    Chief Complaint  Patient presents with  . Abdominal Pain    Patient is a 63 y.o. female presenting with abdominal pain. The history is provided by the patient, medical records and a relative. No language interpreter was used.  Abdominal Pain Pain location:  LLQ and RLQ Pain radiates to:  Does not radiate Pain severity:  Moderate Onset quality:  Sudden Duration:  5 weeks Timing:  Constant Progression:  Unchanged Chronicity:  Recurrent Relieved by:  Nothing Worsened by:  Nothing tried Associated symptoms: diarrhea and nausea   Associated symptoms: no vomiting   Risk factors: being elderly    HPI Comments: [name] is a 63 y.o. female who presents to the Emergency Department complaining of diffuse lower abdominal pain for the pat month. Pt had blood work and a CT scan performed two days ago. Pt has a negative c-diff lab results.  Pt reports having the associated symptoms of nausea, decreased appetite, and diarrhea.    GI is [facility]   Past Medical History  Diagnosis Date  . Chest pain     Negative cardiac catheterization in 2002; negative stress nuclear study in 2008  . Congestive heart failure     Normal LV systolic function  . PAF (paroxysmal atrial fibrillation) 2009  . Chronic anticoagulation   . Diabetes mellitus, type 2     Insulin therapy; exacerbated by prednisone  . Syncope     Admitted 05/2009; magnetic resonance imagin/ MRA - negative; etiology thought to be orthostasis secondary to drugs and dehydration  . GERD (gastroesophageal reflux disease)   . IBS (irritable bowel syndrome)   . Hiatal hernia   . Gastroparesis     99% retention 05/2008 on GES  . Hyperlipidemia   . Hypertension   .  Hypothyroid   . Chronic LBP     Surgical intervention in 1996  . Obesity   . OSA on CPAP   . Anemia     H/H of 10/30 with a normal MCV in 12/09  . Barrett's esophagus     Diagnosed 1995. Last EGD 2008.   . Seizures    Past Surgical History  Procedure Laterality Date  . Cardiovascular stress test  2008    Stress nuclear study  . Back surgery  1996  . Laparoscopic cholecystectomy  1990s  . Total abdominal hysterectomy  1999  . Laminectomy  1995    L4-L5  . Carpal tunnel release  1994  . Esophagogastroduodenoscopy  2008    Barrett's without dysplasia. esphagus dilated. antral erosions, h.pylori serologies negative.  . Colonoscopy  11/2011    Dr. Darrick Pennafields: Internal hemorrhoids, mild diverticulosis. Random colon biopsies negative.  . Givens capsule study  12/07/2011    Proximal small bowel, rare AVM. Distal small bowel, multiple ulcers noted  . Cardiac catheterization  2002  . Esophagogastroduodenoscopy  11/2011    Dr. Darrick PennaFields: Barrett's esophagus, mild gastritis, diverticulum in the second portion of the duodenum repeat EGD 3 years. Small bowel biopsies negative. Gastric biopsy show reactive gastropathy but no H. pylori. Esophageal biopsies consistent with GERD.   Family History  Problem Relation Age of Onset  . Hypertension Mother   . Alzheimer's disease Mother   .  Stroke Mother   . Heart attack Mother   . Heart disease Neg Hx   . Hypertension Other   . Colon cancer Neg Hx    History  Substance Use Topics  . Smoking status: Former Smoker -- 15 years    Quit date: 07/01/1983  . Smokeless tobacco: Never Used     Comment: Remote  . Alcohol Use: No     Comment: last etoh one year ago, never frequent   No OB history provided.  Review of Systems  Constitutional: Positive for appetite change (decreased).  Gastrointestinal: Positive for nausea, abdominal pain and diarrhea. Negative for vomiting.  All other systems reviewed and are negative.    Allergies  Celexa and  Codeine  Home Medications   Current Outpatient Rx  Name  Route  Sig  Dispense  Refill  . apixaban (ELIQUIS) 5 MG TABS tablet   Oral   Take 1 tablet (5 mg total) by mouth 2 (two) times daily.   60 tablet   3   . azithromycin (ZITHROMAX) 250 MG tablet               . diltiazem (CARDIZEM CD) 240 MG 24 hr capsule   Oral   Take 240 mg by mouth every evening.         . furosemide (LASIX) 40 MG tablet   Oral   Take 40 mg by mouth daily as needed. Swelling         . glipiZIDE (GLUCOTROL) 10 MG tablet   Oral   Take 10 mg by mouth 2 (two) times daily.          Marland Kitchen HYDROcodone-acetaminophen (NORCO) 10-325 MG per tablet   Oral   Take 1 tablet by mouth as needed.         . insulin glargine (LANTUS) 100 UNIT/ML injection   Subcutaneous   Inject 50 Units into the skin at bedtime. *ONLY takes when blood sugar levels exceed 200*         . levETIRAcetam (KEPPRA) 250 MG tablet   Oral   Take 1 tablet (250 mg total) by mouth 2 (two) times daily.   60 tablet   11   . levothyroxine (SYNTHROID, LEVOTHROID) 137 MCG tablet   Oral   Take 125 mcg by mouth daily before breakfast.          . loperamide (IMODIUM) 2 MG capsule   Oral   Take 4 mg by mouth once.         Marland Kitchen LORazepam (ATIVAN) 1 MG tablet   Oral   Take 1 mg by mouth 3 (three) times daily as needed. For anxiety         . metFORMIN (GLUCOPHAGE) 500 MG tablet   Oral   Take 2,000 mg by mouth every morning.          . metoCLOPramide (REGLAN) 10 MG tablet   Oral   Take 10 mg by mouth 4 (four) times daily.          . metoprolol (LOPRESSOR) 100 MG tablet      TAKE ONE TABLET BY MOUTH TWICE DAILY.   60 tablet   3   . nitroGLYCERIN (NITROSTAT) 0.4 MG SL tablet   Sublingual   Place 0.4 mg under the tongue as needed. For chest pain          . omeprazole (PRILOSEC) 20 MG capsule   Oral   Take 20 mg by mouth 2 (two) times daily.           Marland Kitchen  ondansetron (ZOFRAN) 8 MG tablet   Oral   Take 8 mg by  mouth daily as needed. nausea         . potassium chloride SA (K-DUR,KLOR-CON) 20 MEQ tablet   Oral   Take 20 mEq by mouth 3 (three) times daily.          . simvastatin (ZOCOR) 40 MG tablet   Oral   Take 40 mg by mouth at bedtime.           BP 136/76  Pulse 74  Temp(Src) 98.1 F (36.7 C) (Oral)  Resp 18  Ht 5\' 8"  (1.727 m)  Wt 207 lb (93.895 kg)  BMI 31.48 kg/m2  SpO2 97%  Physical Exam  Nursing note and vitals reviewed. Constitutional: She is oriented to person, place, and time. She appears well-developed and well-nourished. No distress.  HENT:  Head: Normocephalic.  Eyes: Conjunctivae are normal. Pupils are equal, round, and reactive to light.  Neck: No tracheal deviation present.  Cardiovascular:  No murmur heard. Abdominal: Soft. There is tenderness. There is no rebound and no guarding.  Mild tenderness to the RLQ and LLQ  Musculoskeletal: Normal range of motion.  Neurological: She is alert and oriented to person, place, and time. No cranial nerve deficit. She exhibits normal muscle tone. Coordination normal.  Skin: Skin is warm. She is not diaphoretic.  Psychiatric: She has a normal mood and affect. Her behavior is normal.    ED Course  Procedures (including critical care time) DIAGNOSTIC STUDIES: Oxygen Saturation is 97% on room air, normal by my interpretation.    COORDINATION OF CARE:  3:51 PM Discussed course of care with pt which includes laboratory tests. Pt understands and agrees.  5:41 PM-Informed pt of normal radiology and lab work results. Discussed discharge plan which includes f/u with GI with pt and pt agreed to plan. Addressed symptoms to return for with pt.   Labs Review Labs Reviewed  CBC WITH DIFFERENTIAL - Abnormal; Notable for the following:    Hemoglobin 10.9 (*)    HCT 34.8 (*)    RDW 17.0 (*)    All other components within normal limits  COMPREHENSIVE METABOLIC PANEL - Abnormal; Notable for the following:    Glucose, Bld 174 (*)     Albumin 3.4 (*)    Total Bilirubin 0.2 (*)    All other components within normal limits   Imaging Review Ct Abdomen Pelvis W Contrast  09/03/2013   CLINICAL DATA:  Lower abdominal pain. Diarrhea. Irritable bowel syndrome. Gastroparesis. Weight loss. Diabetes.  EXAM: CT ABDOMEN AND PELVIS WITH CONTRAST  TECHNIQUE: Multidetector CT imaging of the abdomen and pelvis was performed using the standard protocol following bolus administration of intravenous contrast.  CONTRAST:  OMNIPAQUE IOHEXOL 300 MG/ML  SOLN  COMPARISON:  CT ABDOMEN W/CM dated 01/12/2007  FINDINGS: Atelectasis in the left lower lobe. Moderate to prominent cardiomegaly with dense calcification of the mitral valve. The liver, spleen, pancreas, and adrenal glands appear unremarkable. Gallbladder surgically absent. Several hypodense lesions of the left kidney are below 1 cm in size and likely cysts  Aortoiliac atherosclerotic calcification. No dilated bowel observed. Orally administered contrast extends through to the rectum.  Supraumbilical hernia contains adipose tissue. Scattered colonic diverticula noted without active diverticulitis.  Urinary bladder unremarkable. Uterus absent. Ovaries not well seen.  Lower lumbar facet arthropathy noted with bony foraminal stenosis eccentric to the left at L4-5 and to a lesser extent at L3-4.  IMPRESSION: 1. A cause for  the patient's lower abdominal pain is not observed. 2. Moderate to prominent cardiomegaly. 3. Mild atelectasis in the left lower lobe. 4. Dense mitral valve calcification. 5. Small hypodense left renal lesions, probably cysts. 6. Super umbilical hernia contains adipose tissue. 7. Scattered colonic diverticula. 8. Lower lumbar spondylosis.   Electronically Signed   By: Herbie Baltimore M.D.   On: 09/03/2013 17:03     EKG Interpretation None      MDM   Final diagnoses:  None   Chronic abd pain   The chart was scribed for me under my direct supervision.  I personally performed  the history, physical, and medical decision making and all procedures in the evaluation of this patient.[name], MD 09/05/13 [phone]

## 2013-09-05 NOTE — ED Notes (Signed)
Denies diarrhea,  Mild nausea, denies vomiting

## 2013-09-05 NOTE — Discharge Instructions (Signed)
Follow up with your gi md  °

## 2013-09-05 NOTE — ED Notes (Signed)
Pt states lower abdominal pain x 1 mo. CT scan done on Tuesday. Nausea at times but not at present. Also states diarrhea and decreased appetite.

## 2013-09-09 ENCOUNTER — Telehealth: Payer: Self-pay | Admitting: *Deleted

## 2013-09-09 NOTE — Telephone Encounter (Signed)
[name] called to get the results of pt's CT. Please advise [phone]

## 2013-09-10 NOTE — Progress Notes (Signed)
Quick Note:  I called pt's daughter, [name]. She asked if there is any other test that can be done for the abdominal pain. She said the pt said absolutely, she wanted the ileocolonoscopy to be the last thing to do. Please advise! ______

## 2013-09-10 NOTE — Telephone Encounter (Signed)
See result note.  

## 2013-09-10 NOTE — Telephone Encounter (Signed)
I called and spoke to the patient's daughter, [name]. She said they were told the results of the CT when they went to the ED, but she wanted to hear it from here.  She said her mom is still having abdominal pain all the way across her abdomen. [place] it hurts so much that she refuses to eat, afraid it will hurt more.  She is drinking water and fluids.   Please advise!

## 2013-09-13 NOTE — Progress Notes (Addendum)
REVIEWED. PLEASE CALL PT. SHE HAD SB UCERS IN 2013. WE WILL REPEAT CAPSULE TO WORK UP ABD PAIN, SMALL BOWEL ULCERS, ANEMIA.  IF PRESENT, PT NEEDS TO HAVE A LOWER ENDOSCOPY.

## 2013-09-16 ENCOUNTER — Other Ambulatory Visit: Payer: Self-pay | Admitting: Gastroenterology

## 2013-09-16 ENCOUNTER — Telehealth: Payer: Self-pay | Admitting: Gastroenterology

## 2013-09-16 ENCOUNTER — Encounter (HOSPITAL_COMMUNITY): Payer: Self-pay | Admitting: Pharmacy Technician

## 2013-09-16 NOTE — Telephone Encounter (Signed)
No Pre Auth is required for GIVENS

## 2013-09-16 NOTE — Progress Notes (Signed)
See SLF recommendations from below.  Patient needs SB capsule to follow up on h/o SB ulcers, ongoing weight loss/abd pain/anemia. Hold iron for one week. Continue eliquis.

## 2013-09-16 NOTE — Progress Notes (Signed)
Quick Note:  Called and informed pt's daughter, [name]. She is aware that Soledad Gerlach has scheduled pt for 09/27/2013. Instructions and info in the mail. ______

## 2013-09-16 NOTE — Progress Notes (Signed)
[name], [name], is aware of the Given's. She is also aware Soledad Gerlach has mailed instructions. She knows for pt to continue the Eliquis and hold iron for 7 days prior.

## 2013-09-16 NOTE — Progress Notes (Signed)
Capsule Study is scheduled for Friday March 27th and I have mailed [name] the instructions

## 2013-09-16 NOTE — Telephone Encounter (Signed)
Per Victorino Dike at [facility]

## 2013-09-17 NOTE — Progress Notes (Signed)
Quick Note:  Please see other note of encounter of 09/03/2013 and dated 09/16/2013. Pt ok to be scheduled for the Givens that was ordered. ______

## 2013-09-19 ENCOUNTER — Ambulatory Visit: Payer: Medicare Other | Admitting: Gastroenterology

## 2013-09-27 ENCOUNTER — Encounter (HOSPITAL_COMMUNITY): Payer: Self-pay | Admitting: Gastroenterology

## 2013-09-27 ENCOUNTER — Ambulatory Visit (HOSPITAL_COMMUNITY)
Admission: RE | Admit: 2013-09-27 | Discharge: 2013-09-27 | Disposition: A | Discharge status: Home / Self Care | Payer: Medicare Other | Source: Ambulatory Visit | Attending: Gastroenterology | Admitting: Gastroenterology

## 2013-09-27 ENCOUNTER — Encounter (HOSPITAL_COMMUNITY)
Admission: RE | Disposition: A | Discharge status: Home / Self Care | Payer: Self-pay | Source: Ambulatory Visit | Attending: Gastroenterology

## 2013-09-27 HISTORY — PX: GIVENS CAPSULE STUDY: SHX5432

## 2013-09-27 SURGERY — IMAGING PROCEDURE, GI TRACT, INTRALUMINAL, VIA CAPSULE

## 2013-09-27 NOTE — Progress Notes (Signed)
Pt was here this morning for Givens Capsule.  Pt swallowed capsule without complications.  Pt was instructed to call back if the blue light on the box stopped blinking. After the patient was home for approximately an hour the blue light stopped blinking and turned to orange.  Pt came back to the hospital.  We placed the box back on the doc.  It was fully charged and working properly.  We changed the sensor belt on the patient.  After making many attempts to resolve the problem,  We were still unsuccessful.  We spoke with Arnette Felts and  Technical Support was notified.  Technical Support came to the conclusion that the problem was a defect with the pill.  [facility] notified to reschedule patient for a later date.

## 2013-09-30 ENCOUNTER — Other Ambulatory Visit: Payer: Self-pay | Admitting: Gastroenterology

## 2013-09-30 ENCOUNTER — Telehealth: Payer: Self-pay | Admitting: Gastroenterology

## 2013-09-30 NOTE — Telephone Encounter (Signed)
REVIEWED.  

## 2013-09-30 NOTE — Telephone Encounter (Signed)
GIVENS Capsule Study that was schedule on Friday March 27th has been R/S to Thursday April 9th at 7:30 am due to falty Capsules

## 2013-10-02 ENCOUNTER — Encounter (HOSPITAL_COMMUNITY): Payer: Self-pay

## 2013-10-10 ENCOUNTER — Ambulatory Visit (HOSPITAL_COMMUNITY)
Admission: RE | Admit: 2013-10-10 | Discharge: 2013-10-10 | Disposition: A | Discharge status: Home / Self Care | Payer: Medicare Other | Source: Ambulatory Visit | Attending: Gastroenterology | Admitting: Gastroenterology

## 2013-10-10 ENCOUNTER — Encounter (HOSPITAL_COMMUNITY)
Admission: RE | Disposition: A | Discharge status: Home / Self Care | Payer: Self-pay | Source: Ambulatory Visit | Attending: Gastroenterology

## 2013-10-10 DIAGNOSIS — R197 Diarrhea, unspecified: Secondary | ICD-10-CM

## 2013-10-10 DIAGNOSIS — E669 Obesity, unspecified: Secondary | ICD-10-CM | POA: Insufficient documentation

## 2013-10-10 DIAGNOSIS — Z87891 Personal history of nicotine dependence: Secondary | ICD-10-CM | POA: Insufficient documentation

## 2013-10-10 DIAGNOSIS — I10 Essential (primary) hypertension: Secondary | ICD-10-CM | POA: Insufficient documentation

## 2013-10-10 DIAGNOSIS — Z79899 Other long term (current) drug therapy: Secondary | ICD-10-CM | POA: Insufficient documentation

## 2013-10-10 DIAGNOSIS — K633 Ulcer of intestine: Secondary | ICD-10-CM | POA: Insufficient documentation

## 2013-10-10 DIAGNOSIS — E7800 Pure hypercholesterolemia, unspecified: Secondary | ICD-10-CM | POA: Insufficient documentation

## 2013-10-10 DIAGNOSIS — E119 Type 2 diabetes mellitus without complications: Secondary | ICD-10-CM | POA: Insufficient documentation

## 2013-10-10 DIAGNOSIS — D509 Iron deficiency anemia, unspecified: Secondary | ICD-10-CM

## 2013-10-10 HISTORY — PX: GIVENS CAPSULE STUDY: SHX5432

## 2013-10-10 SURGERY — IMAGING PROCEDURE, GI TRACT, INTRALUMINAL, VIA CAPSULE

## 2013-10-11 DIAGNOSIS — D509 Iron deficiency anemia, unspecified: Secondary | ICD-10-CM

## 2013-10-11 DIAGNOSIS — R197 Diarrhea, unspecified: Secondary | ICD-10-CM

## 2013-10-12 NOTE — Telephone Encounter (Signed)
PLEASE CALL PT. HER GIVENS STUDY SHOWS THE ULCERS ARE STILL PRESENT. SHE NEEDS TO HAVE A COLONOSCOPY TO LOOK INTO THE LAST APRT OF HER SMALL INTESTINES TO GET BIOPSIES.

## 2013-10-12 NOTE — Procedures (Addendum)
SURGEON:  Surgeon(s): Arlyce Harman, MD  PATIENT DATA: 200 LBS, HEIGHT 68 IN, WAIST: 44 IN, GASTRIC PASSAGE TIME: 2 m, SB PASSAGE TIME: 4H 9m  RESULTS: LIMITED views of gastric mucosa due to retained contents. No blood in the stomach. Multiple ulcers seen in terminal ileum begining 84% of SB PT and extending to the IC valve (94%).  No masses or AVMs. LIMITED VIEWS OF THE COLON DUE TO RETAINED CONTENTS.  DIAGNOSIS: IDIOPATHIC SMALL BOWEL ULCERS PERSISTS SINCE 2013  PLAN: 1. COLONOSCOPY TO EVALUATE the small bowel.

## 2013-10-13 ENCOUNTER — Emergency Department (HOSPITAL_COMMUNITY): Payer: Medicare Other

## 2013-10-13 ENCOUNTER — Encounter (HOSPITAL_COMMUNITY): Payer: Self-pay | Admitting: Emergency Medicine

## 2013-10-13 ENCOUNTER — Emergency Department (HOSPITAL_COMMUNITY)
Admission: EM | Admit: 2013-10-13 | Discharge: 2013-10-13 | Disposition: A | Discharge status: Home / Self Care | Payer: Medicare Other | Attending: Emergency Medicine | Admitting: Emergency Medicine

## 2013-10-13 DIAGNOSIS — E119 Type 2 diabetes mellitus without complications: Secondary | ICD-10-CM | POA: Insufficient documentation

## 2013-10-13 DIAGNOSIS — Z9981 Dependence on supplemental oxygen: Secondary | ICD-10-CM | POA: Insufficient documentation

## 2013-10-13 DIAGNOSIS — E039 Hypothyroidism, unspecified: Secondary | ICD-10-CM | POA: Insufficient documentation

## 2013-10-13 DIAGNOSIS — Z87891 Personal history of nicotine dependence: Secondary | ICD-10-CM | POA: Insufficient documentation

## 2013-10-13 DIAGNOSIS — R079 Chest pain, unspecified: Secondary | ICD-10-CM

## 2013-10-13 DIAGNOSIS — K2270 Barrett's esophagus without dysplasia: Secondary | ICD-10-CM | POA: Insufficient documentation

## 2013-10-13 DIAGNOSIS — G4730 Sleep apnea, unspecified: Secondary | ICD-10-CM | POA: Insufficient documentation

## 2013-10-13 DIAGNOSIS — E7800 Pure hypercholesterolemia, unspecified: Secondary | ICD-10-CM | POA: Insufficient documentation

## 2013-10-13 DIAGNOSIS — E669 Obesity, unspecified: Secondary | ICD-10-CM | POA: Insufficient documentation

## 2013-10-13 DIAGNOSIS — K3184 Gastroparesis: Secondary | ICD-10-CM | POA: Insufficient documentation

## 2013-10-13 DIAGNOSIS — K219 Gastro-esophageal reflux disease without esophagitis: Secondary | ICD-10-CM | POA: Insufficient documentation

## 2013-10-13 DIAGNOSIS — Z862 Personal history of diseases of the blood and blood-forming organs and certain disorders involving the immune mechanism: Secondary | ICD-10-CM | POA: Insufficient documentation

## 2013-10-13 DIAGNOSIS — Z794 Long term (current) use of insulin: Secondary | ICD-10-CM | POA: Insufficient documentation

## 2013-10-13 DIAGNOSIS — I509 Heart failure, unspecified: Secondary | ICD-10-CM | POA: Insufficient documentation

## 2013-10-13 DIAGNOSIS — Z9889 Other specified postprocedural states: Secondary | ICD-10-CM | POA: Insufficient documentation

## 2013-10-13 DIAGNOSIS — Z79899 Other long term (current) drug therapy: Secondary | ICD-10-CM | POA: Insufficient documentation

## 2013-10-13 DIAGNOSIS — R059 Cough, unspecified: Secondary | ICD-10-CM | POA: Insufficient documentation

## 2013-10-13 DIAGNOSIS — I4891 Unspecified atrial fibrillation: Secondary | ICD-10-CM | POA: Insufficient documentation

## 2013-10-13 DIAGNOSIS — Z87828 Personal history of other (healed) physical injury and trauma: Secondary | ICD-10-CM | POA: Insufficient documentation

## 2013-10-13 DIAGNOSIS — G4733 Obstructive sleep apnea (adult) (pediatric): Secondary | ICD-10-CM | POA: Insufficient documentation

## 2013-10-13 DIAGNOSIS — Z7902 Long term (current) use of antithrombotics/antiplatelets: Secondary | ICD-10-CM | POA: Insufficient documentation

## 2013-10-13 DIAGNOSIS — G40909 Epilepsy, unspecified, not intractable, without status epilepticus: Secondary | ICD-10-CM | POA: Insufficient documentation

## 2013-10-13 DIAGNOSIS — R05 Cough: Secondary | ICD-10-CM | POA: Insufficient documentation

## 2013-10-13 DIAGNOSIS — E785 Hyperlipidemia, unspecified: Secondary | ICD-10-CM | POA: Insufficient documentation

## 2013-10-13 DIAGNOSIS — I10 Essential (primary) hypertension: Secondary | ICD-10-CM | POA: Insufficient documentation

## 2013-10-13 LAB — CBC
HCT: 36 % (ref 36.0–46.0)
Hemoglobin: 11.5 g/dL — ABNORMAL LOW (ref 12.0–15.0)
MCH: 27.3 pg (ref 26.0–34.0)
MCHC: 31.9 g/dL (ref 30.0–36.0)
MCV: 85.5 fL (ref 78.0–100.0)
PLATELETS: 204 10*3/uL (ref 150–400)
RBC: 4.21 MIL/uL (ref 3.87–5.11)
RDW: 18 % — AB (ref 11.5–15.5)
WBC: 7.3 10*3/uL (ref 4.0–10.5)

## 2013-10-13 LAB — TROPONIN I: Troponin I: <0.3 ng/mL (ref <0.30)

## 2013-10-13 LAB — HEPATIC FUNCTION PANEL
ALT: 8 U/L (ref 0–35)
AST: 11 U/L (ref 0–37)
Albumin: 3.4 g/dL — ABNORMAL LOW (ref 3.5–5.2)
Alkaline Phosphatase: 46 U/L (ref 39–117)
Total Bilirubin: 0.4 mg/dL (ref 0.3–1.2)
Total Protein: 6.5 g/dL (ref 6.0–8.3)

## 2013-10-13 LAB — BASIC METABOLIC PANEL
BUN: 11 mg/dL (ref 6–23)
CO2: 25 mEq/L (ref 19–32)
CREATININE: 0.6 mg/dL (ref 0.50–1.10)
Calcium: 9.3 mg/dL (ref 8.4–10.5)
Chloride: 104 mEq/L (ref 96–112)
Glucose, Bld: 152 mg/dL — ABNORMAL HIGH (ref 70–99)
Potassium: 3.8 mEq/L (ref 3.7–5.3)
Sodium: 142 mEq/L (ref 137–147)

## 2013-10-13 LAB — PRO B NATRIURETIC PEPTIDE: PRO B NATRI PEPTIDE: 3088 pg/mL — AB (ref 0–125)

## 2013-10-13 MED ORDER — MORPHINE SULFATE 4 MG/ML IJ SOLN
4.0000 mg | Freq: Once | INTRAMUSCULAR | Status: AC
  Administered 2013-10-13: 4 mg via INTRAVENOUS
  Filled 2013-10-13: qty 1

## 2013-10-13 MED ORDER — FUROSEMIDE 10 MG/ML IJ SOLN
40.0000 mg | Freq: Once | INTRAMUSCULAR | Status: AC
  Administered 2013-10-13: 40 mg via INTRAVENOUS
  Filled 2013-10-13: qty 4

## 2013-10-13 MED ORDER — IOHEXOL 300 MG/ML  SOLN
80.0000 mL | Freq: Once | INTRAMUSCULAR | Status: AC | PRN
  Administered 2013-10-13: 80 mL via INTRAVENOUS

## 2013-10-13 MED ORDER — MORPHINE SULFATE 2 MG/ML IJ SOLN
2.0000 mg | Freq: Once | INTRAMUSCULAR | Status: AC
  Administered 2013-10-13: 2 mg via INTRAVENOUS
  Filled 2013-10-13: qty 1

## 2013-10-13 MED ORDER — NITROGLYCERIN 0.4 MG SL SUBL
0.4000 mg | SUBLINGUAL_TABLET | SUBLINGUAL | Status: DC | PRN
  Administered 2013-10-13: 0.4 mg via SUBLINGUAL

## 2013-10-13 NOTE — Discharge Instructions (Signed)
Follow up with your md in 1-2 days.  Take your lasix daily

## 2013-10-13 NOTE — ED Notes (Signed)
Pt reporting pain on left side of chest, starting about 0600.  Denies radiation into arms, neck or back.  Denies SOB or nausea.

## 2013-10-13 NOTE — ED Notes (Signed)
Pt c/o of "stabbing, catching pain" to left rib area under left breast. Med ordered.

## 2013-10-13 NOTE — ED Provider Notes (Signed)
CSN: [id number]     Arrival date & time 10/13/13  4098 History  This chart was scribed for [name], MD by Leone Payor, ED Scribe. This patient was seen in room APA07/[facility]07 and the patient's care was started 7:19 AM.   Chief Complaint  Patient presents with  . Chest Pain     Patient is a 63 y.o. female presenting with chest pain. The history is provided by the patient. No language interpreter was used.  Chest Pain Pain location:  L chest Pain radiates to:  Does not radiate Pain radiates to the back: no   Pain severity:  Mild Onset quality:  Gradual Duration:  1 hour Timing:  Constant Progression:  Unchanged Chronicity:  Recurrent Context: at rest   Relieved by:  Nothing Associated symptoms: cough   Associated symptoms: no abdominal pain, no back pain, no fatigue, no headache, no nausea, no shortness of breath and not vomiting   Risk factors: high cholesterol and hypertension     HPI Comments: [name] is a 63 y.o. female who presents to the Emergency Department complaining of constant, unchanged, non-radiating left sided chest pain that began about 1 hour ago. She reports associated cough and took Allegra with relief. She reports similar symptoms in the past but states this was not very recently. She does not use oxygen at home. She has not taken her diuretic medication today. She denies neck pain, back pain, nausea, SOB.    Past Medical History  Diagnosis Date  . Chest pain     Negative cardiac catheterization in 2002; negative stress nuclear study in 2008  . Congestive heart failure     Normal LV systolic function  . PAF (paroxysmal atrial fibrillation) 2009  . Chronic anticoagulation   . Diabetes mellitus, type 2     Insulin therapy; exacerbated by prednisone  . Syncope     Admitted 05/2009; magnetic resonance imagin/ MRA - negative; etiology thought to be orthostasis secondary to drugs and dehydration  . GERD (gastroesophageal reflux disease)   . IBS  (irritable bowel syndrome)   . Hiatal hernia   . Gastroparesis     99% retention 05/2008 on GES  . Hyperlipidemia   . Hypertension   . Hypothyroid   . Chronic LBP     Surgical intervention in 1996  . Obesity   . OSA on CPAP   . Anemia     H/H of 10/30 with a normal MCV in 12/09  . Barrett's esophagus     Diagnosed 1995. Last EGD 2008.   . Seizures    Past Surgical History  Procedure Laterality Date  . Cardiovascular stress test  2008    Stress nuclear study  . Back surgery  1996  . Laparoscopic cholecystectomy  1990s  . Total abdominal hysterectomy  1999  . Laminectomy  1995    L4-L5  . Carpal tunnel release  1994  . Esophagogastroduodenoscopy  2008    Barrett's without dysplasia. esphagus dilated. antral erosions, h.pylori serologies negative.  . Colonoscopy  11/2011    Dr. Darrick Penna: Internal hemorrhoids, mild diverticulosis. Random colon biopsies negative.  . Givens capsule study  12/07/2011    Proximal small bowel, rare AVM. Distal small bowel, multiple ulcers noted  . Cardiac catheterization  2002  . Esophagogastroduodenoscopy  11/2011    Dr. Darrick Penna: Barrett's esophagus, mild gastritis, diverticulum in the second portion of the duodenum repeat EGD 3 years. Small bowel biopsies negative. Gastric biopsy show reactive gastropathy but no  H. pylori. Esophageal biopsies consistent with GERD.  [employer] capsule study N/A 09/27/2013    Procedure: GIVENS CAPSULE STUDY;  Surgeon: West Bali, MD;  Location: [facility];  Service: Endoscopy;  Laterality: N/A;  7:30   Family History  Problem Relation Age of Onset  . Hypertension Mother   . Alzheimer's disease Mother   . Stroke Mother   . Heart attack Mother   . Heart disease Neg Hx   . Hypertension Other   . Colon cancer Neg Hx    History  Substance Use Topics  . Smoking status: Former Smoker -- 15 years    Quit date: 07/01/1983  . Smokeless tobacco: Never Used     Comment: Remote  . Alcohol Use: No     Comment: last etoh  one year ago, never frequent   OB History   Grav Para Term Preterm Abortions TAB SAB Ect Mult Living                 Review of Systems  Constitutional: Negative for appetite change and fatigue.  HENT: Negative for congestion, ear discharge and sinus pressure.   Eyes: Negative for discharge.  Respiratory: Positive for cough. Negative for shortness of breath.   Cardiovascular: Positive for chest pain.  Gastrointestinal: Negative for nausea, vomiting, abdominal pain and diarrhea.  Genitourinary: Negative for frequency and hematuria.  Musculoskeletal: Negative for back pain.  Skin: Negative for rash.  Neurological: Negative for seizures and headaches.  Psychiatric/Behavioral: Negative for hallucinations.      Allergies  Ibuprofen; Celexa; and Codeine  Home Medications   Current Outpatient Rx  Name  Route  Sig  Dispense  Refill  . acetaminophen (TYLENOL ARTHRITIS PAIN) 650 MG CR tablet   Oral   Take 1,300 mg by mouth every 8 (eight) hours as needed for pain.         Marland Kitchen apixaban (ELIQUIS) 5 MG TABS tablet   Oral   Take 1 tablet (5 mg total) by mouth 2 (two) times daily.   60 tablet   3   . diltiazem (CARDIZEM CD) 240 MG 24 hr capsule   Oral   Take 240 mg by mouth every evening.         Marland Kitchen glipiZIDE (GLUCOTROL) 10 MG tablet   Oral   Take 10 mg by mouth 2 (two) times daily.          Marland Kitchen HYDROcodone-acetaminophen (NORCO/VICODIN) 5-325 MG per tablet   Oral   Take 1 tablet by mouth every 6 (six) hours as needed for moderate pain.   20 tablet   0   . levETIRAcetam (KEPPRA) 250 MG tablet   Oral   Take 1 tablet (250 mg total) by mouth 2 (two) times daily.   60 tablet   11   . levothyroxine (SYNTHROID, LEVOTHROID) 125 MCG tablet   Oral   Take 125 mcg by mouth daily.         . metaxalone (SKELAXIN) 800 MG tablet   Oral   Take 800 mg by mouth 3 (three) times daily.         . metFORMIN (GLUCOPHAGE) 500 MG tablet   Oral   Take 1,000 mg by mouth 2 (two) times  daily with a meal.          . metoCLOPramide (REGLAN) 10 MG tablet   Oral   Take 10 mg by mouth 4 (four) times daily.          . metoprolol (  LOPRESSOR) 100 MG tablet   Oral   Take 100 mg by mouth 2 (two) times daily.         . nitroGLYCERIN (NITROSTAT) 0.4 MG SL tablet   Sublingual   Place 0.4 mg under the tongue every 5 (five) minutes as needed for chest pain. For chest pain          . omeprazole (PRILOSEC) 20 MG capsule   Oral   Take 20 mg by mouth 2 (two) times daily.           . ondansetron (ZOFRAN) 8 MG tablet   Oral   Take 8 mg by mouth daily as needed. nausea         . potassium chloride SA (K-DUR,KLOR-CON) 20 MEQ tablet   Oral   Take 20 mEq by mouth 3 (three) times daily.          . simvastatin (ZOCOR) 40 MG tablet   Oral   Take 40 mg by mouth at bedtime.          . furosemide (LASIX) 40 MG tablet   Oral   Take 20 mg by mouth daily as needed for fluid. Swelling         . Insulin Glargine (LANTUS SOLOSTAR) 100 UNIT/ML Solostar Pen   Subcutaneous   Inject 14-20 Units into the skin at bedtime.          Marland Kitchen. loperamide (IMODIUM) 2 MG capsule   Oral   Take 4 mg by mouth as needed for diarrhea or loose stools.          Marland Kitchen. LORazepam (ATIVAN) 1 MG tablet   Oral   Take 1 mg by mouth 3 (three) times daily as needed for anxiety or sleep. For anxiety          BP 144/76  Pulse 82  Temp(Src) 98.5 F (36.9 C) (Oral)  Resp 17  Ht 5\' 8"  (1.727 m)  Wt 200 lb (90.719 kg)  BMI 30.42 kg/m2  SpO2 96% Physical Exam  Nursing note and vitals reviewed. Constitutional: She is oriented to person, place, and time. She appears well-developed.  HENT:  Head: Normocephalic.  Eyes: Conjunctivae and EOM are normal. No scleral icterus.  Neck: Neck supple. No thyromegaly present.  Cardiovascular: Normal rate and normal heart sounds.  An irregular rhythm present. Exam reveals no gallop and no friction rub.   No murmur heard. Pulmonary/Chest: Effort normal and  breath sounds normal. No stridor. She has no wheezes. She has no rales. She exhibits no tenderness.  Abdominal: She exhibits no distension. There is no tenderness. There is no rebound.  Musculoskeletal: Normal range of motion. She exhibits no edema.  Lymphadenopathy:    She has no cervical adenopathy.  Neurological: She is oriented to person, place, and time. She exhibits normal muscle tone. Coordination normal.  Skin: No rash noted. No erythema.  Psychiatric: She has a normal mood and affect. Her behavior is normal.    ED Course  Procedures (including critical care time)  DIAGNOSTIC STUDIES: Oxygen Saturation is 96% on RA, adequate by my interpretation.    COORDINATION OF CARE: 7:20 AM Discussed treatment plan with pt at bedside and pt agreed to plan.   11:46 AM Upon recheck, patient states she is feeling weak. Discussed imaging and lab results with patient and family member.    Labs Review Labs Reviewed  CBC - Abnormal; Notable for the following:    Hemoglobin 11.5 (*)    RDW 18.0 (*)  All other components within normal limits  BASIC METABOLIC PANEL - Abnormal; Notable for the following:    Glucose, Bld 152 (*)    All other components within normal limits  HEPATIC FUNCTION PANEL - Abnormal; Notable for the following:    Albumin 3.4 (*)    All other components within normal limits  PRO B NATRIURETIC PEPTIDE - Abnormal; Notable for the following:    Pro B Natriuretic peptide (BNP) 3088.0 (*)    All other components within normal limits  TROPONIN I  TROPONIN I   Imaging Review Ct Chest W Contrast  10/13/2013   CLINICAL DATA:  Chest pain  EXAM: CT CHEST WITH CONTRAST  TECHNIQUE: Multidetector CT imaging of the chest was performed during intravenous contrast administration.  CONTRAST:  80mL OMNIPAQUE IOHEXOL 300 MG/ML  SOLN  COMPARISON:  DG CHEST 1V PORT dated 10/13/2013; CT ANGIO CHEST W/CM &/OR WO/CM dated 06/29/2013; DG CHEST 1V PORT dated 06/29/2013; DG CHEST 2 VIEW dated  07/08/2013  FINDINGS: Examination is degraded secondary to patient body habitus.  There is minimal subsegmental atelectasis within in the left lower lobe, lingula and right middle lobe. No discrete focal airspace opacities. No discrete pulmonary nodules given the limitation of the examination. There is minimal apical predominant interlobular septal thickening. No pleural effusion or pneumothorax. The central pulmonary airways appear widely patent.  No mediastinal, hilar or axillary lymphadenopathy.  Cardiomegaly. Exuberant calcifications within the mitral valve annulus. No pericardial effusion.  The main pulmonary artery is enlarged, measuring 4.4 cm in diameter (image 19, series 2). Although this examination was not tailored for the evaluation of the pulmonary arteries, there are no discrete filling defects within the pulmonary arterial tree to the level of the bilateral subsegmental pulmonary arteries to suggest central pulmonary embolism.  Normal caliber of the thoracic aorta. Bovine configuration of the aortic arch is incidentally noted. Branch vessels of the arch are widely patent throughout their imaged course. There is scattered minimal atherosclerotic plaque within the aortic arch, not resulting in hemodynamically significant stenosis. No definite aortic dissection or periaortic stranding on this non CTA examination.  Limited evaluation of the upper abdomen demonstrates a small hiatal hernia.  No acute or aggressive osseus abnormalities. Stigmata of DISH within the lower thoracic spine. Regional soft tissues appear normal.  IMPRESSION: 1. Mild pulmonary edema. Otherwise, no explanation for patient's chest pain. Specifically, no evidence of pneumonia. 2. Cardiomegaly with calcifications of the mitral valve annulus. Additionally, there is marked enlargement of the caliber of the main pulmonary artery suggestive of pulmonary arterial hypertension. Further evaluation with cardiac echo could be performed as  clinically indicated. 3. No evidence of central pulmonary embolism or thoracic aortic aneurysm/dissection on this non CTA examination.   Electronically Signed   By: Simonne Come M.D.   On: 10/13/2013 09:29   Dg Chest Port 1 View  10/13/2013   CLINICAL DATA:  Chest pain  EXAM: PORTABLE CHEST - 1 VIEW  COMPARISON:  Chest radiograph 07/08/2013 and CT chest 06/29/2013  FINDINGS: Stable cardiac enlargement. Pulmonary vascular congestion discuss that pulmonary vascularity appears upper normal. There is a left basilar opacity. The left costophrenic angle is blunted, which may be in part due to the enlarged heart. Negative for pulmonary edema. Negative for pneumothorax. No acute osseous abnormality identified.  IMPRESSION: 1. Stable cardiomegaly without failure. 2. Left retrocardiac opacity. Potential etiologies include atelectasis or airspace disease. 3. Left pleural effusion not excluded by portable technique.   Electronically Signed   By: Darl Pikes  Turner M.D.   On: 10/13/2013 07:34     EKG Interpretation   Date/Time:  Sunday October 13 2013 06:56:35 EDT Ventricular Rate:  102 PR Interval:    QRS Duration: 98 QT Interval:  384 QTC Calculation: 500 R Axis:   -34 Text Interpretation:  Atrial fibrillation with rapid ventricular response  with premature ventricular or aberrantly conducted complexes Left axis  deviation Nonspecific ST and T wave abnormality Abnormal ECG When compared  with ECG of 08-Jul-2013 17:51, T wave inversion now evident in Anterior  leads Confirmed by Ravenne Wayment  MD, Thandiwe Siragusa [phone]) on 10/13/2013 11:29:46 AM      MDM   Final diagnoses:  None    The chart was scribed for me under my direct supervision.  I personally performed the history, physical, and medical decision making and all procedures in the evaluation of this patient.[name], MD 10/13/13 [phone]

## 2013-10-14 ENCOUNTER — Encounter (HOSPITAL_COMMUNITY): Payer: Self-pay | Admitting: Gastroenterology

## 2013-10-14 NOTE — Telephone Encounter (Signed)
See [facility] note. Patient still has distal SB ulcers.  Colonoscopy with ileoscopy recommended for tissue sampling.   We will need to find out how to manage Eliquis from her cardiologist or prescribing provider.   Need updated triage too.

## 2013-10-15 ENCOUNTER — Telehealth: Payer: Self-pay

## 2013-10-15 NOTE — Telephone Encounter (Signed)
See separate triage.  

## 2013-10-15 NOTE — Telephone Encounter (Signed)
May hold Eliquis 1-2 days prior to procedure. Restart ASAP afterwards.

## 2013-10-15 NOTE — Telephone Encounter (Signed)
[name], this pt is tentatively scheduled for a colonoscopy for 11/08/2013 with Dr. Darrick Penna,  due to abnormal GIVENS.  We would like to know if it is OK to hold the Eliquis prior to the procedure and if so for how long?   Please advise!

## 2013-10-15 NOTE — Telephone Encounter (Signed)
Gastroenterology Pre-Procedure Review  Request Date: 10/15/2013 Requesting Physician: Dr. Darrick Penna  PATIENT REVIEW QUESTIONS: The patient responded to the following health history questions as indicated:    1. Diabetes Melitis: YES 2. Joint replacements in the past 12 months: no 3. Major health problems in the past 3 months: no 4. Has an artificial valve or MVP: no 5. Has a defibrillator: no 6. Has been advised in past to take antibiotics in advance of a procedure like teeth cleaning: no    MEDICATIONS & ALLERGIES:    Patient reports the following regarding taking any blood thinners:   Plavix? no Aspirin? no Coumadin? no PT IS TAKING ELIQUIS  Patient confirms/reports the following medications:  Current Outpatient Prescriptions  Medication Sig Dispense Refill  . acetaminophen (TYLENOL ARTHRITIS PAIN) 650 MG CR tablet Take 1,300 mg by mouth every 8 (eight) hours as needed for pain.      Marland Kitchen apixaban (ELIQUIS) 5 MG TABS tablet Take 1 tablet (5 mg total) by mouth 2 (two) times daily.  60 tablet  3  . diltiazem (CARDIZEM CD) 240 MG 24 hr capsule Take 240 mg by mouth every evening.      . furosemide (LASIX) 40 MG tablet Take 20 mg by mouth daily as needed for fluid. Swelling      . glipiZIDE (GLUCOTROL) 10 MG tablet Take 10 mg by mouth 2 (two) times daily.       . Insulin Glargine (LANTUS SOLOSTAR) 100 UNIT/ML Solostar Pen Inject 20-22 Units into the skin at bedtime.       . levETIRAcetam (KEPPRA) 250 MG tablet Take 1 tablet (250 mg total) by mouth 2 (two) times daily.  60 tablet  11  . levothyroxine (SYNTHROID, LEVOTHROID) 125 MCG tablet Take 125 mcg by mouth daily.      Marland Kitchen loperamide (IMODIUM) 2 MG capsule Take 4 mg by mouth as needed for diarrhea or loose stools.       Marland Kitchen LORazepam (ATIVAN) 1 MG tablet Take 1 mg by mouth 3 (three) times daily as needed for anxiety or sleep. For anxiety      . metaxalone (SKELAXIN) 800 MG tablet Take 800 mg by mouth 3 (three) times daily.      . metFORMIN  (GLUCOPHAGE) 500 MG tablet Take 1,000 mg by mouth 2 (two) times daily with a meal.       . metoCLOPramide (REGLAN) 10 MG tablet Take 10 mg by mouth 4 (four) times daily.       . metoprolol (LOPRESSOR) 100 MG tablet Take 100 mg by mouth 2 (two) times daily.      Marland Kitchen omeprazole (PRILOSEC) 20 MG capsule Take 20 mg by mouth 2 (two) times daily.        . ondansetron (ZOFRAN) 8 MG tablet Take 8 mg by mouth daily as needed. nausea      . potassium chloride SA (K-DUR,KLOR-CON) 20 MEQ tablet Take 20 mEq by mouth 3 (three) times daily.       . simvastatin (ZOCOR) 40 MG tablet Take 40 mg by mouth at bedtime.       Marland Kitchen HYDROcodone-acetaminophen (NORCO/VICODIN) 5-325 MG per tablet Take 1 tablet by mouth every 6 (six) hours as needed for moderate pain.  20 tablet  0  . nitroGLYCERIN (NITROSTAT) 0.4 MG SL tablet Place 0.4 mg under the tongue every 5 (five) minutes as needed for chest pain. For chest pain        No current facility-administered medications for this visit.  Patient confirms/reports the following allergies:  Allergies  Allergen Reactions  . Ibuprofen     Upsets her stomach and causes abdominal pain  . Celexa [Citalopram Hydrobromide]   . Codeine Nausea And Vomiting    HALLUCINATIONS    No orders of the defined types were placed in this encounter.    AUTHORIZATION INFORMATION Primary Insurance:   ID #:  Group #:  Pre-Cert / Auth required:  Pre-Cert / Auth #:   Secondary Insurance:   ID #:  Group #:  Pre-Cert / Auth required: Pre-Cert / Auth #:   SCHEDULE INFORMATION: Procedure has been scheduled as follows:  Date: 11/08/2013       Time:    Location: [facility] Short Stay  This Gastroenterology Pre-Precedure Review Form is being routed to the following provider(s): Jonette EvaSandi Fields, MD

## 2013-10-16 ENCOUNTER — Other Ambulatory Visit: Payer: Self-pay

## 2013-10-16 DIAGNOSIS — Z1211 Encounter for screening for malignant neoplasm of colon: Secondary | ICD-10-CM

## 2013-10-16 MED ORDER — PEG-KCL-NACL-NASULF-NA ASC-C 100 G PO SOLR: 1.0000 | ORAL | Status: DC

## 2013-10-16 NOTE — Telephone Encounter (Signed)
Rx sent to the pharmacy and instructions mailed to pt.  

## 2013-10-16 NOTE — Telephone Encounter (Signed)
Noted. See Triage sheet.

## 2013-10-16 NOTE — Telephone Encounter (Signed)
Hold Eliquis two days before procedure.  Day of prep: glipizide 5mg  BID, Lantus 10 units at bedtime, metformin 500mg  BID.  Please make note to remind Dr. Darrick Penna to address when to restart Eliquis after procedure.

## 2013-10-16 NOTE — Telephone Encounter (Signed)
Routing to [place], [place]. In Dr. Evelina Dun absence.

## 2013-10-21 ENCOUNTER — Telehealth: Payer: Self-pay

## 2013-10-21 NOTE — Telephone Encounter (Signed)
Per Carlisle Beers at [facility] [place], [place] not required for screening colonoscopy.

## 2013-10-21 NOTE — Telephone Encounter (Signed)
LMOM for [name] to call me to review the instructions for meds prior to procedure.

## 2013-10-22 NOTE — Telephone Encounter (Signed)
REVIEWED.  

## 2013-10-23 NOTE — Telephone Encounter (Signed)
I called [name] and she has not received the instructions in the mail yet. I reviewed the med info and also faxed the instructions to her at [phone] per her request.

## 2013-10-25 ENCOUNTER — Encounter (HOSPITAL_COMMUNITY): Payer: Self-pay | Admitting: Pharmacy Technician

## 2013-11-08 ENCOUNTER — Encounter (HOSPITAL_COMMUNITY)
Admission: RE | Disposition: A | Discharge status: Home / Self Care | Payer: Self-pay | Source: Ambulatory Visit | Attending: Gastroenterology

## 2013-11-08 ENCOUNTER — Ambulatory Visit (HOSPITAL_COMMUNITY)
Admission: RE | Admit: 2013-11-08 | Discharge: 2013-11-08 | Disposition: A | Discharge status: Home / Self Care | Payer: Medicare Other | Source: Ambulatory Visit | Attending: Gastroenterology | Admitting: Gastroenterology

## 2013-11-08 ENCOUNTER — Encounter (HOSPITAL_COMMUNITY): Payer: Self-pay | Admitting: *Deleted

## 2013-11-08 DIAGNOSIS — Z1211 Encounter for screening for malignant neoplasm of colon: Secondary | ICD-10-CM

## 2013-11-08 DIAGNOSIS — Z6830 Body mass index (BMI) 30.0-30.9, adult: Secondary | ICD-10-CM | POA: Insufficient documentation

## 2013-11-08 DIAGNOSIS — I10 Essential (primary) hypertension: Secondary | ICD-10-CM | POA: Insufficient documentation

## 2013-11-08 DIAGNOSIS — R634 Abnormal weight loss: Secondary | ICD-10-CM

## 2013-11-08 DIAGNOSIS — E785 Hyperlipidemia, unspecified: Secondary | ICD-10-CM | POA: Insufficient documentation

## 2013-11-08 DIAGNOSIS — E119 Type 2 diabetes mellitus without complications: Secondary | ICD-10-CM | POA: Insufficient documentation

## 2013-11-08 DIAGNOSIS — K2270 Barrett's esophagus without dysplasia: Secondary | ICD-10-CM | POA: Insufficient documentation

## 2013-11-08 DIAGNOSIS — I509 Heart failure, unspecified: Secondary | ICD-10-CM | POA: Insufficient documentation

## 2013-11-08 DIAGNOSIS — Z885 Allergy status to narcotic agent status: Secondary | ICD-10-CM | POA: Insufficient documentation

## 2013-11-08 DIAGNOSIS — Z87891 Personal history of nicotine dependence: Secondary | ICD-10-CM | POA: Insufficient documentation

## 2013-11-08 DIAGNOSIS — E039 Hypothyroidism, unspecified: Secondary | ICD-10-CM | POA: Insufficient documentation

## 2013-11-08 DIAGNOSIS — K449 Diaphragmatic hernia without obstruction or gangrene: Secondary | ICD-10-CM | POA: Insufficient documentation

## 2013-11-08 DIAGNOSIS — G4733 Obstructive sleep apnea (adult) (pediatric): Secondary | ICD-10-CM | POA: Insufficient documentation

## 2013-11-08 DIAGNOSIS — E669 Obesity, unspecified: Secondary | ICD-10-CM | POA: Insufficient documentation

## 2013-11-08 DIAGNOSIS — K219 Gastro-esophageal reflux disease without esophagitis: Secondary | ICD-10-CM | POA: Insufficient documentation

## 2013-11-08 DIAGNOSIS — Z886 Allergy status to analgesic agent status: Secondary | ICD-10-CM | POA: Insufficient documentation

## 2013-11-08 DIAGNOSIS — K589 Irritable bowel syndrome without diarrhea: Secondary | ICD-10-CM | POA: Insufficient documentation

## 2013-11-08 DIAGNOSIS — Z794 Long term (current) use of insulin: Secondary | ICD-10-CM | POA: Insufficient documentation

## 2013-11-08 DIAGNOSIS — Z79899 Other long term (current) drug therapy: Secondary | ICD-10-CM | POA: Insufficient documentation

## 2013-11-08 DIAGNOSIS — R197 Diarrhea, unspecified: Secondary | ICD-10-CM

## 2013-11-08 DIAGNOSIS — K5730 Diverticulosis of large intestine without perforation or abscess without bleeding: Secondary | ICD-10-CM | POA: Insufficient documentation

## 2013-11-08 HISTORY — PX: BIOPSY: SHX5522

## 2013-11-08 HISTORY — PX: COLONOSCOPY: SHX5424

## 2013-11-08 LAB — GLUCOSE, CAPILLARY: Glucose-Capillary: 142 mg/dL — ABNORMAL HIGH (ref 70–99)

## 2013-11-08 SURGERY — COLONOSCOPY
Anesthesia: Moderate Sedation

## 2013-11-08 MED ORDER — MEPERIDINE HCL 100 MG/ML IJ SOLN
INTRAMUSCULAR | Status: AC
  Filled 2013-11-08: qty 2

## 2013-11-08 MED ORDER — SODIUM CHLORIDE 0.9 % IV SOLN
INTRAVENOUS | Status: DC
  Administered 2013-11-08: 09:00:00 via INTRAVENOUS

## 2013-11-08 MED ORDER — MIDAZOLAM HCL 5 MG/5ML IJ SOLN
INTRAMUSCULAR | Status: AC
  Filled 2013-11-08: qty 10

## 2013-11-08 MED ORDER — MEPERIDINE HCL 100 MG/ML IJ SOLN
INTRAMUSCULAR | Status: DC | PRN
  Administered 2013-11-08: 50 mg via INTRAVENOUS
  Administered 2013-11-08 (×2): 25 mg via INTRAVENOUS

## 2013-11-08 MED ORDER — MIDAZOLAM HCL 5 MG/5ML IJ SOLN
INTRAMUSCULAR | Status: DC | PRN
Start: 2013-11-08 — End: 2013-11-08
  Administered 2013-11-08 (×2): 1 mg via INTRAVENOUS
  Administered 2013-11-08 (×3): 2 mg via INTRAVENOUS

## 2013-11-08 MED ORDER — STERILE WATER FOR IRRIGATION IR SOLN
Status: DC | PRN
  Administered 2013-11-08: 09:00:00

## 2013-11-08 NOTE — Discharge Instructions (Signed)
You DID NOT HAVE ANY POLYPS. YOU HAVE DIVERTICULOSIS IN YOUR LEFT AND RIGHT COLON. YOUR SMALL BOWEL LOOKED NORMAL. I BIOPSIED YOUR SMALL BOWEL AND COLON.   DRINK WATER TO KEEP URINE LIGHT YELLOW.  FOLLOW A HIGH FIBER/LOW FAT DIET. SEE INFO BELOW.  FOLLOW UP IN 3 MOS.  Next colonoscopy in 10 years.     Colonoscopy Care After Read the instructions outlined below and refer to this sheet in the next week. These discharge instructions provide you with general information on caring for yourself after you leave the hospital. While your treatment has been planned according to the most current medical practices available, unavoidable complications occasionally occur. If you have any problems or questions after discharge, call DR. Shiori Adcox, [phone].  ACTIVITY  You may resume your regular activity, but move at a slower pace for the next 24 hours.   Take frequent rest periods for the next 24 hours.   Walking will help get rid of the air and reduce the bloated feeling in your belly (abdomen).   No driving for 24 hours (because of the medicine (anesthesia) used during the test).   You may shower.   Do not sign any important legal documents or operate any machinery for 24 hours (because of the anesthesia used during the test).    NUTRITION  Drink plenty of fluids.   You may resume your normal diet as instructed by your doctor.   Begin with a light meal and progress to your normal diet. Heavy or fried foods are harder to digest and may make you feel sick to your stomach (nauseated).   Avoid alcoholic beverages for 24 hours or as instructed.    MEDICATIONS  You may resume your normal medications.   WHAT YOU CAN EXPECT TODAY  Some feelings of bloating in the abdomen.   Passage of more gas than usual.   Spotting of blood in your stool or on the toilet paper  .  IF YOU HAD POLYPS REMOVED DURING THE COLONOSCOPY:  Eat a soft diet IF YOU HAVE NAUSEA, BLOATING, ABDOMINAL PAIN, OR  VOMITING.    FINDING OUT THE RESULTS OF YOUR TEST Not all test results are available during your visit. DR. Darrick Penna WILL CALL YOU WITHIN 7 DAYS OF YOUR PROCEDUE WITH YOUR RESULTS. Do not assume everything is normal if you have not heard from DR. Vaudie Engebretsen IN ONE WEEK, CALL HER OFFICE AT [phone].  SEEK IMMEDIATE MEDICAL ATTENTION AND CALL THE OFFICE: [phone] IF:  You have more than a spotting of blood in your stool.   Your belly is swollen (abdominal distention).   You are nauseated or vomiting.   You have a temperature over 101F.   You have abdominal pain or discomfort that is severe or gets worse throughout the day.   Low-Fat Diet BREADS, CEREALS, PASTA, RICE, DRIED PEAS, AND BEANS These products are high in carbohydrates and most are low in fat. Therefore, they can be increased in the diet as substitutes for fatty foods. They too, however, contain calories and should not be eaten in excess. Cereals can be eaten for snacks as well as for breakfast.   FRUITS AND VEGETABLES It is good to eat fruits and vegetables. Besides being sources of fiber, both are rich in vitamins and some minerals. They help you get the daily allowances of these nutrients. Fruits and vegetables can be used for snacks and desserts.  MEATS Limit lean meat, chicken, [place], and fish to no more than 6 ounces per day.  Beef, Pork, and Lamb Use lean cuts of beef, pork, and lamb. Lean cuts include:  Extra-lean ground beef.  Arm roast.  Sirloin tip.  Center-cut ham.  Round steak.  Loin chops.  Rump roast.  Tenderloin.  Trim all fat off the outside of meats before cooking. It is not necessary to severely decrease the intake of red meat, but lean choices should be made. Lean meat is rich in protein and contains a highly absorbable form of iron. Premenopausal women, in particular, should avoid reducing lean red meat because this could increase the risk for low red blood cells (iron-deficiency  anemia).  Chicken and [place] These are good sources of protein. The fat of poultry can be reduced by removing the skin and underlying fat layers before cooking. Chicken and [place] can be substituted for lean red meat in the diet. Poultry should not be fried or covered with high-fat sauces. Fish and Shellfish Fish is a good source of protein. Shellfish contain cholesterol, but they usually are low in saturated fatty acids. The preparation of fish is important. Like chicken and [place], they should not be fried or covered with high-fat sauces. EGGS Egg whites contain no fat or cholesterol. They can be eaten often. Try 1 to 2 egg whites instead of whole eggs in recipes or use egg substitutes that do not contain yolk. MILK AND DAIRY PRODUCTS Use skim or 1% milk instead of 2% or whole milk. Decrease whole milk, natural, and processed cheeses. Use nonfat or low-fat (2%) cottage cheese or low-fat cheeses made from vegetable oils. Choose nonfat or low-fat (1 to 2%) yogurt. Experiment with evaporated skim milk in recipes that call for heavy cream. Substitute low-fat yogurt or low-fat cottage cheese for sour cream in dips and salad dressings. Have at least 2 servings of low-fat dairy products, such as 2 glasses of skim (or 1%) milk each day to help get your daily calcium intake. FATS AND OILS Reduce the total intake of fats, especially saturated fat. Butterfat, lard, and beef fats are high in saturated fat and cholesterol. These should be avoided as much as possible. Vegetable fats do not contain cholesterol, but certain vegetable fats, such as coconut oil, palm oil, and palm kernel oil are very high in saturated fats. These should be limited. These fats are often used in bakery goods, processed foods, popcorn, oils, and nondairy creamers. Vegetable shortenings and some peanut butters contain hydrogenated oils, which are also saturated fats. Read the labels on these foods and check for saturated vegetable  oils. Unsaturated vegetable oils and fats do not raise blood cholesterol. However, they should be limited because they are fats and are high in calories. Total fat should still be limited to 30% of your daily caloric intake. Desirable liquid vegetable oils are corn oil, cottonseed oil, olive oil, canola oil, safflower oil, soybean oil, and sunflower oil. Peanut oil is not as good, but small amounts are acceptable. Buy a heart-healthy tub margarine that has no partially hydrogenated oils in the ingredients. Mayonnaise and salad dressings often are made from unsaturated fats, but they should also be limited because of their high calorie and fat content. Seeds, nuts, peanut butter, olives, and avocados are high in fat, but the fat is mainly the unsaturated type. These foods should be limited mainly to avoid excess calories and fat. OTHER EATING TIPS Snacks  Most sweets should be limited as snacks. They tend to be rich in calories and fats, and their caloric content outweighs their nutritional value.  Some good choices in snacks are graham crackers, melba toast, soda crackers, bagels (no egg), English muffins, fruits, and vegetables. These snacks are preferable to snack crackers, [place] fries, TORTILLA CHIPS, and POTATO chips. Popcorn should be air-popped or cooked in small amounts of liquid vegetable oil. Desserts Eat fruit, low-fat yogurt, and fruit ices instead of pastries, cake, and cookies. Sherbet, angel food cake, gelatin dessert, frozen low-fat yogurt, or other frozen products that do not contain saturated fat (pure fruit juice bars, frozen ice pops) are also acceptable.  COOKING METHODS Choose those methods that use little or no fat. They include: Poaching.  Braising.  Steaming.  Grilling.  Baking.  Stir-frying.  Broiling.  Microwaving.  Foods can be cooked in a nonstick pan without added fat, or use a nonfat cooking spray in regular cookware. Limit fried foods and avoid frying in saturated  fat. Add moisture to lean meats by using water, broth, cooking wines, and other nonfat or low-fat sauces along with the cooking methods mentioned above. Soups and stews should be chilled after cooking. The fat that forms on top after a few hours in the refrigerator should be skimmed off. When preparing meals, avoid using excess salt. Salt can contribute to raising blood pressure in some people.  EATING AWAY FROM HOME Order entres, potatoes, and vegetables without sauces or butter. When meat exceeds the size of a deck of cards (3 to 4 ounces), the rest can be taken home for another meal. Choose vegetable or fruit salads and ask for low-calorie salad dressings to be served on the side. Use dressings sparingly. Limit high-fat toppings, such as bacon, crumbled eggs, cheese, sunflower seeds, and olives. Ask for heart-healthy tub margarine instead of butter.   High-Fiber Diet A high-fiber diet changes your normal diet to include more whole grains, legumes, fruits, and vegetables. Changes in the diet involve replacing refined carbohydrates with unrefined foods. The calorie level of the diet is essentially unchanged. The Dietary Reference Intake (recommended amount) for adult males is 38 grams per day. For adult females, it is 25 grams per day. Pregnant and lactating women should consume 28 grams of fiber per day. Fiber is the intact part of a plant that is not broken down during digestion. Functional fiber is fiber that has been isolated from the plant to provide a beneficial effect in the body. PURPOSE  Increase stool bulk.   Ease and regulate bowel movements.   Lower cholesterol.  INDICATIONS THAT YOU NEED MORE FIBER  Constipation and hemorrhoids.   Uncomplicated diverticulosis (intestine condition) and irritable bowel syndrome.   Weight management.   As a protective measure against hardening of the arteries (atherosclerosis), diabetes, and cancer.   GUIDELINES FOR INCREASING FIBER IN THE  DIET  Start adding fiber to the diet slowly. A gradual increase of about 5 more grams (2 slices of whole-wheat bread, 2 servings of most fruits or vegetables, or 1 bowl of high-fiber cereal) per day is best. Too rapid an increase in fiber may result in constipation, flatulence, and bloating.   Drink enough water and fluids to keep your urine clear or pale yellow. Water, juice, or caffeine-free drinks are recommended. Not drinking enough fluid may cause constipation.   Eat a variety of high-fiber foods rather than one type of fiber.   Try to increase your intake of fiber through using high-fiber foods rather than fiber pills or supplements that contain small amounts of fiber.   The goal is to change the types of food eaten.  Do not supplement your present diet with high-fiber foods, but replace foods in your present diet.  INCLUDE A VARIETY OF FIBER SOURCES  Replace refined and processed grains with whole grains, canned fruits with fresh fruits, and incorporate other fiber sources. White rice, white breads, and most bakery goods contain little or no fiber.   Brown whole-grain rice, buckwheat oats, and many fruits and vegetables are all good sources of fiber. These include: broccoli, Brussels sprouts, cabbage, cauliflower, beets, sweet potatoes, white potatoes (skin on), carrots, tomatoes, eggplant, squash, berries, fresh fruits, and dried fruits.   Cereals appear to be the richest source of fiber. Cereal fiber is found in whole grains and bran. Bran is the fiber-rich outer coat of cereal grain, which is largely removed in refining. In whole-grain cereals, the bran remains. In breakfast cereals, the largest amount of fiber is found in those with "bran" in their names. The fiber content is sometimes indicated on the label.   You may need to include additional fruits and vegetables each day.   In baking, for 1 cup white flour, you may use the following substitutions:   1 cup whole-wheat flour minus  2 tablespoons.   1/2 cup white flour plus 1/2 cup whole-wheat flour.   Diverticulosis Diverticulosis is a common condition that develops when small pouches (diverticula) form in the wall of the colon. The risk of diverticulosis increases with age. It happens more often in people who eat a low-fiber diet. Most individuals with diverticulosis have no symptoms. Those individuals with symptoms usually experience belly (abdominal) pain, constipation, or loose stools (diarrhea).  HOME CARE INSTRUCTIONS  Increase the amount of fiber in your diet as directed by your caregiver or dietician. This may reduce symptoms of diverticulosis.   Drink at least 6 to 8 glasses of water each day to prevent constipation.   Try not to strain when you have a bowel movement.   THERE IS NO NEED TO Avoid nuts and seeds to prevent complications.   FOODS HAVING HIGH FIBER CONTENT INCLUDE:  Fruits. Apple, peach, pear, tangerine, raisins, prunes.   Vegetables. Brussels sprouts, asparagus, broccoli, cabbage, carrot, cauliflower, romaine lettuce, spinach, summer squash, tomato, winter squash, zucchini.   Starchy Vegetables. Baked beans, kidney beans, lima beans, split peas, lentils, potatoes (with skin).   Grains. Whole wheat bread, brown rice, bran flake cereal, plain oatmeal, white rice, shredded wheat, bran muffins.

## 2013-11-08 NOTE — Op Note (Signed)
[facility] [address] [place], [address]   COLONOSCOPY PROCEDURE REPORT  PATIENT: Tara, [name]  MR#: [id number] BIRTHDATE: 1950-12-17 , 62  yrs. old GENDER: Female ENDOSCOPIST: Jonette Eva, MD REFERRED XY:VOPFY [name], M.D. PROCEDURE DATE:  11/08/2013 PROCEDURE:   Colonoscopy with biopsy AND ENTRADA OVERTUBE INDICATIONS:chronic diarrhea, unexplained diarrhea, and WEIGHT LOSS: MAR 2014 232 LBS AND FEB 2015: 205 LBS. MEDICATIONS: Demerol 100 mg IV and Versed 8 mg IV  DESCRIPTION OF PROCEDURE:    Physical exam was performed.  Informed consent was obtained from the patient after explaining the benefits, risks, and alternatives to procedure.  The patient was connected to monitor and placed in left lateral position. Continuous oxygen was provided by nasal cannula and IV medicine administered through an indwelling cannula.  After administration of sedation and rectal exam, the patients rectum was intubated and the EC-3890Li ([id number]) and EC-3490TLi ([id number])  colonoscope was advanced under direct visualization to 70 CM FROM THE ANUS. THE OVERTUBE WAS ADVANCED WITHOUT DIFFICULTY. THE PEDS SCOPE WAS ADVANCED TO THE ileum.  The scope was removed slowly by carefully examining the color, texture, anatomy, and integrity mucosa on the way out.  The patient was recovered in endoscopy and discharged home in satisfactory condition.    COLON FINDINGS: The mucosa appeared normal in the terminal ileum. SCOPED ADVANCED 30 CM INTO THE ILEUM. Multiple biopsies were performed.  , The colon IS redundant.  Manual abdominal counter-pressure was used to reach the cecum.  The patient was moved on to their back to reach the cecum, There was moderate diverticulosis noted throughout the entire examined colon with associated muscular hypertrophy and angulation.  , The mucosa appeared normal in the terminal ileum.  , and A normal appearing cecum, ileocecal valve, and appendiceal  orifice were identified. The ascending, hepatic flexure, transverse, splenic flexure, descending, sigmoid colon and rectum appeared unremarkable.  No polyps or cancers were seen.  Multiple biopsies were performed.  PREP QUALITY: good.  CECAL W/D TIME: 25 minutes     COMPLICATIONS: None  ENDOSCOPIC IMPRESSION: 1.   Normal mucosa in the terminal ileum 2.   The colon IS redundant 3.   Moderate diverticulosis throughout the entire examined colon 4.   NO OBVIOUS SOURCE FOR EROSIONS/DIARRHEA/ANEMIA IDENTIFIED  RECOMMENDATIONS: DRINK WATER TO KEEP URINE LIGHT YELLOW. FOLLOW A HIGH FIBER/LOW FAT DIET.  SEE INFO BELOW. AWAIT BIOPSY. RE-START ELIQUIS MAY 11. FOLLOW UP IN 3 MOS.  Next colonoscopy in 10 years WITH PEDS SCOPE AND AN OVERTUBE.     _______________________________ Rosalie DoctorJonette Eva, MD 11/08/2013 8:04 PM

## 2013-11-08 NOTE — H&P (Signed)
Primary Care Physician:  Robert Bellow, MD Primary Gastroenterologist:  Dr. Oneida Alar  Pre-Procedure History & Physical: HPI:  [name] is a 62 y.o. female here for [place].  Past Medical History  Diagnosis Date  . Chest pain     Negative cardiac catheterization in 2002; negative stress nuclear study in 2008  . Congestive heart failure     Normal LV systolic function  . PAF (paroxysmal atrial fibrillation) 2009  . Chronic anticoagulation   . Diabetes mellitus, type 2     Insulin therapy; exacerbated by prednisone  . Syncope     Admitted 05/2009; magnetic resonance imagin/ MRA - negative; etiology thought to be orthostasis secondary to drugs and dehydration  . GERD (gastroesophageal reflux disease)   . IBS (irritable bowel syndrome)   . Hiatal hernia   . Gastroparesis     99% retention 05/2008 on GES  . Hyperlipidemia   . Hypertension   . Hypothyroid   . Chronic LBP     Surgical intervention in 1996  . Obesity   . OSA on CPAP   . Anemia     H/H of 10/30 with a normal MCV in 12/09  . Barrett's esophagus     Diagnosed 1995. Last EGD 2008.   . Seizures     Past Surgical History  Procedure Laterality Date  . Cardiovascular stress test  2008    Stress nuclear study  . Back surgery  1996  . Laparoscopic cholecystectomy  1990s  . Total abdominal hysterectomy  1999  . Laminectomy  1995    L4-L5  . Carpal tunnel release  1994  . Esophagogastroduodenoscopy  2008    Barrett's without dysplasia. esphagus dilated. antral erosions, h.pylori serologies negative.  . Colonoscopy  11/2011    Dr. Oneida Alar: Internal hemorrhoids, mild diverticulosis. Random colon biopsies negative.  . Givens capsule study  12/07/2011    Proximal small bowel, rare AVM. Distal small bowel, multiple ulcers noted  . Cardiac catheterization  2002  . Esophagogastroduodenoscopy  11/2011    Dr. Oneida Alar: Barrett's esophagus, mild gastritis, diverticulum in the second portion of the duodenum repeat  EGD 3 years. Small bowel biopsies negative. Gastric biopsy show reactive gastropathy but no H. pylori. Esophageal biopsies consistent with GERD.  Freda Munro capsule study N/A 09/27/2013    Procedure: GIVENS CAPSULE STUDY;  Surgeon: Danie Binder, MD;  Location: [facility];  Service: Endoscopy;  Laterality: N/A;  7:30  . Givens capsule study N/A 10/10/2013    Procedure: GIVENS CAPSULE STUDY;  Surgeon: Danie Binder, MD;  Location: [facility];  Service: Endoscopy;  Laterality: N/A;  7:30    Prior to Admission medications   Medication Sig Start Date End Date Taking? Authorizing Provider  acetaminophen (TYLENOL ARTHRITIS PAIN) 650 MG CR tablet Take 1,300 mg by mouth every 8 (eight) hours as needed for pain.   Yes Historical Provider, MD  apixaban (ELIQUIS) 5 MG TABS tablet Take 1 tablet (5 mg total) by mouth 2 (two) times daily. 07/23/13  Yes Herminio Commons, MD  diltiazem (CARDIZEM CD) 240 MG 24 hr capsule Take 240 mg by mouth every evening. 07/04/13  Yes Robert Bellow, MD  glipiZIDE (GLUCOTROL) 10 MG tablet Take 10 mg by mouth 2 (two) times daily.    Yes Historical Provider, MD  Insulin Glargine (LANTUS SOLOSTAR) 100 UNIT/ML Solostar Pen Inject 20-22 Units into the skin at bedtime.    Yes Historical Provider, MD  levETIRAcetam (KEPPRA) 250 MG tablet Take  1 tablet (250 mg total) by mouth 2 (two) times daily. 11/10/12  Yes Robert Bellow, MD  levothyroxine (SYNTHROID, LEVOTHROID) 125 MCG tablet Take 125 mcg by mouth daily.   Yes Historical Provider, MD  loperamide (IMODIUM) 2 MG capsule Take 4 mg by mouth as needed for diarrhea or loose stools.    Yes Historical Provider, MD  LORazepam (ATIVAN) 1 MG tablet Take 1 mg by mouth 3 (three) times daily as needed for anxiety or sleep. For anxiety   Yes Historical Provider, MD  metaxalone (SKELAXIN) 800 MG tablet Take 800 mg by mouth 3 (three) times daily. 08/24/13  Yes Historical Provider, MD  metFORMIN (GLUCOPHAGE) 500 MG tablet Take 1,000 mg by  mouth 2 (two) times daily with a meal.    Yes Historical Provider, MD  metoCLOPramide (REGLAN) 10 MG tablet Take 10 mg by mouth 4 (four) times daily.  09/20/12  Yes Historical Provider, MD  metoprolol (LOPRESSOR) 100 MG tablet Take 100 mg by mouth 2 (two) times daily.   Yes Historical Provider, MD  nitroGLYCERIN (NITROSTAT) 0.4 MG SL tablet Place 0.4 mg under the tongue every 5 (five) minutes as needed for chest pain. For chest pain    Yes Historical Provider, MD  omeprazole (PRILOSEC) 20 MG capsule Take 20 mg by mouth 2 (two) times daily.     Yes Historical Provider, MD  ondansetron (ZOFRAN) 8 MG tablet Take 8 mg by mouth daily as needed. nausea 11/01/11  Yes Historical Provider, MD  peg 3350 powder (MOVIPREP) 100 G SOLR Take 1 kit (200 g total) by mouth as directed. 10/16/13  Yes Danie Binder, MD  potassium chloride SA (K-DUR,KLOR-CON) 20 MEQ tablet Take 20 mEq by mouth 3 (three) times daily.    Yes Historical Provider, MD  simvastatin (ZOCOR) 40 MG tablet Take 40 mg by mouth at bedtime.    Yes Historical Provider, MD    Allergies as of 10/16/2013 - Review Complete 10/15/2013  Allergen Reaction Noted  . Ibuprofen  10/13/2013  . Celexa [citalopram hydrobromide]  06/30/2013  . Codeine Nausea And Vomiting     Family History  Problem Relation Age of Onset  . Hypertension Mother   . Alzheimer's disease Mother   . Stroke Mother   . Heart attack Mother   . Heart disease Neg Hx   . Hypertension Other   . Colon cancer Neg Hx     History   Social History  . Marital Status: Married    Spouse Name: N/A    Number of Children: N/A  . Years of Education: N/A   Occupational History  . 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     Social History Main Topics  . Smoking status: Former Smoker -- 15 years    Quit date: 07/01/1983  . Smokeless tobacco: Never Used     Comment: Remote  . Alcohol Use: No     Comment: last etoh one year ago, never frequent  . Drug Use: No  . Sexual  Activity: Yes    Birth Control/ Protection: None, Surgical   Other Topics Concern  . Not on file   Social History Narrative   Sedentary   4 children, "blended family"    Review of Systems: See HPI, otherwise negative ROS   Physical Exam: BP 160/91  Pulse 94  Temp(Src) 97.9 F (36.6 C) (Oral)  Resp 18  Ht _0  (1.727 m)  Wt 200 lb (90.719 kg)  BMI  30.42 kg/m2  SpO2 99% General:   Alert,  pleasant and cooperative in NAD Head:  Normocephalic and atraumatic. Neck:  Supple; Lungs:  Clear throughout to auscultation.    Heart:  Regular rate and rhythm. Abdomen:  Soft, nontender and nondistended. Normal bowel sounds, without guarding, and without rebound.   Neurologic:  Alert and  oriented x4;  grossly normal neurologically.  Impression/Plan:    . Diarrhea/ANEMIA  PLAN: TCS TODAY WITH BIOPSY

## 2013-11-13 ENCOUNTER — Encounter (HOSPITAL_COMMUNITY): Payer: Self-pay | Admitting: Gastroenterology

## 2013-11-15 ENCOUNTER — Telehealth: Payer: Self-pay | Admitting: Gastroenterology

## 2013-11-15 NOTE — Telephone Encounter (Signed)
PLEASE CALL PT. NO SOURCE FOR HER ANEMIA/LOOSE STOOLS WAS IDENTIFIED. SHE MOST LIKELY HAS LOW BLOOD COUNT DUE TO SMALL BOWEL AVMS WHILE ON A BLOOD THINNER. HER BLOOD COUNT IS BETTER IN 2015 THAN IT WAS IN 2013.   HER SMALL BOWEL ULCERS HAD RESOLVED BY THE TIME OF THE TCS. HER LOOSE STOOLS ARE MOST LIKELY DUE TO IBS & DIABETES. SHE SHOULD STOP BY THE [facility] TO PICKUP SAMPLES OF CREON(36) #50 AND  TAKE 2 WITH MEALS AND ONE WITH SNACKS. SHE SHOULD CALL IN 2 WEEKS IF HER SYMPTOMS ARE NOT BETTER.  IF THE CREON HELPS SHE SHOULD CALL FOR A Rx.   OPV AUG 2015 E30 DIARRHEA/ANEMIA.

## 2013-11-18 NOTE — Telephone Encounter (Signed)
Pt is aware of OV on 8/21 at 1030 with LSL

## 2013-11-18 NOTE — Telephone Encounter (Signed)
I called and informed pt's daughter, [name]. They will come by and pick up the samples tomorrow.

## 2013-12-03 ENCOUNTER — Emergency Department (HOSPITAL_COMMUNITY): Payer: Medicare Other

## 2013-12-03 ENCOUNTER — Encounter (HOSPITAL_COMMUNITY): Payer: Self-pay | Admitting: Emergency Medicine

## 2013-12-03 ENCOUNTER — Observation Stay (HOSPITAL_COMMUNITY)
Admission: EM | Admit: 2013-12-03 | Discharge: 2013-12-04 | Disposition: A | Discharge status: Home / Self Care | Payer: Medicare Other | Attending: Internal Medicine | Admitting: Internal Medicine

## 2013-12-03 DIAGNOSIS — E669 Obesity, unspecified: Secondary | ICD-10-CM | POA: Diagnosis present

## 2013-12-03 DIAGNOSIS — R1030 Lower abdominal pain, unspecified: Secondary | ICD-10-CM

## 2013-12-03 DIAGNOSIS — Z7901 Long term (current) use of anticoagulants: Secondary | ICD-10-CM

## 2013-12-03 DIAGNOSIS — K2270 Barrett's esophagus without dysplasia: Secondary | ICD-10-CM | POA: Insufficient documentation

## 2013-12-03 DIAGNOSIS — F5000 Anorexia nervosa, unspecified: Secondary | ICD-10-CM

## 2013-12-03 DIAGNOSIS — I10 Essential (primary) hypertension: Secondary | ICD-10-CM | POA: Diagnosis present

## 2013-12-03 DIAGNOSIS — D509 Iron deficiency anemia, unspecified: Secondary | ICD-10-CM | POA: Diagnosis present

## 2013-12-03 DIAGNOSIS — G4733 Obstructive sleep apnea (adult) (pediatric): Secondary | ICD-10-CM | POA: Insufficient documentation

## 2013-12-03 DIAGNOSIS — R072 Precordial pain: Secondary | ICD-10-CM

## 2013-12-03 DIAGNOSIS — R079 Chest pain, unspecified: Principal | ICD-10-CM | POA: Diagnosis present

## 2013-12-03 DIAGNOSIS — E119 Type 2 diabetes mellitus without complications: Secondary | ICD-10-CM | POA: Insufficient documentation

## 2013-12-03 DIAGNOSIS — I509 Heart failure, unspecified: Secondary | ICD-10-CM | POA: Insufficient documentation

## 2013-12-03 DIAGNOSIS — G4730 Sleep apnea, unspecified: Secondary | ICD-10-CM

## 2013-12-03 DIAGNOSIS — K3184 Gastroparesis: Secondary | ICD-10-CM

## 2013-12-03 DIAGNOSIS — R251 Tremor, unspecified: Secondary | ICD-10-CM | POA: Diagnosis present

## 2013-12-03 DIAGNOSIS — I4891 Unspecified atrial fibrillation: Secondary | ICD-10-CM | POA: Diagnosis present

## 2013-12-03 DIAGNOSIS — R197 Diarrhea, unspecified: Secondary | ICD-10-CM

## 2013-12-03 DIAGNOSIS — Z8669 Personal history of other diseases of the nervous system and sense organs: Secondary | ICD-10-CM

## 2013-12-03 DIAGNOSIS — E039 Hypothyroidism, unspecified: Secondary | ICD-10-CM

## 2013-12-03 DIAGNOSIS — K589 Irritable bowel syndrome without diarrhea: Secondary | ICD-10-CM | POA: Insufficient documentation

## 2013-12-03 DIAGNOSIS — M545 Low back pain, unspecified: Secondary | ICD-10-CM | POA: Insufficient documentation

## 2013-12-03 DIAGNOSIS — I951 Orthostatic hypotension: Secondary | ICD-10-CM

## 2013-12-03 DIAGNOSIS — Z5181 Encounter for therapeutic drug level monitoring: Secondary | ICD-10-CM

## 2013-12-03 DIAGNOSIS — E785 Hyperlipidemia, unspecified: Secondary | ICD-10-CM

## 2013-12-03 DIAGNOSIS — K219 Gastro-esophageal reflux disease without esophagitis: Secondary | ICD-10-CM | POA: Insufficient documentation

## 2013-12-03 DIAGNOSIS — R634 Abnormal weight loss: Secondary | ICD-10-CM

## 2013-12-03 DIAGNOSIS — J984 Other disorders of lung: Secondary | ICD-10-CM

## 2013-12-03 DIAGNOSIS — R0600 Dyspnea, unspecified: Secondary | ICD-10-CM

## 2013-12-03 LAB — COMPREHENSIVE METABOLIC PANEL
ALBUMIN: 3.1 g/dL — AB (ref 3.5–5.2)
ALT: 7 U/L (ref 0–35)
AST: 13 U/L (ref 0–37)
Alkaline Phosphatase: 52 U/L (ref 39–117)
BUN: 21 mg/dL (ref 6–23)
CALCIUM: 8.3 mg/dL — AB (ref 8.4–10.5)
CO2: 21 mEq/L (ref 19–32)
CREATININE: 0.58 mg/dL (ref 0.50–1.10)
Chloride: 102 mEq/L (ref 96–112)
GFR calc Af Amer: >90 mL/min (ref >90)
GFR calc non Af Amer: >90 mL/min (ref >90)
Glucose, Bld: 312 mg/dL — ABNORMAL HIGH (ref 70–99)
Potassium: 3.8 mEq/L (ref 3.7–5.3)
SODIUM: 139 meq/L (ref 137–147)
Total Bilirubin: 0.4 mg/dL (ref 0.3–1.2)
Total Protein: 6.1 g/dL (ref 6.0–8.3)

## 2013-12-03 LAB — GLUCOSE, CAPILLARY: GLUCOSE-CAPILLARY: 185 mg/dL — AB (ref 70–99)

## 2013-12-03 LAB — CBC WITH DIFFERENTIAL/PLATELET
BASOS ABS: 0 10*3/uL (ref 0.0–0.1)
BASOS PCT: 0 % (ref 0–1)
EOS ABS: 0 10*3/uL (ref 0.0–0.7)
EOS PCT: 0 % (ref 0–5)
HCT: 33.5 % — ABNORMAL LOW (ref 36.0–46.0)
Hemoglobin: 10.8 g/dL — ABNORMAL LOW (ref 12.0–15.0)
Lymphocytes Relative: 32 % (ref 12–46)
Lymphs Abs: 1.7 10*3/uL (ref 0.7–4.0)
MCH: 28.8 pg (ref 26.0–34.0)
MCHC: 32.2 g/dL (ref 30.0–36.0)
MCV: 89.3 fL (ref 78.0–100.0)
Monocytes Absolute: 0.5 10*3/uL (ref 0.1–1.0)
Monocytes Relative: 9 % (ref 3–12)
Neutro Abs: 3 10*3/uL (ref 1.7–7.7)
Neutrophils Relative %: 59 % (ref 43–77)
PLATELETS: 189 10*3/uL (ref 150–400)
RBC: 3.75 MIL/uL — ABNORMAL LOW (ref 3.87–5.11)
RDW: 15.3 % (ref 11.5–15.5)
WBC: 5.1 10*3/uL (ref 4.0–10.5)

## 2013-12-03 LAB — TROPONIN I: Troponin I: <0.3 ng/mL (ref <0.30)

## 2013-12-03 MED ORDER — POTASSIUM CHLORIDE CRYS ER 20 MEQ PO TBCR
20.0000 meq | EXTENDED_RELEASE_TABLET | Freq: Three times a day (TID) | ORAL | Status: DC
  Administered 2013-12-03 – 2013-12-04 (×2): 20 meq via ORAL
  Filled 2013-12-03 (×2): qty 1

## 2013-12-03 MED ORDER — PANTOPRAZOLE SODIUM 40 MG PO TBEC
40.0000 mg | DELAYED_RELEASE_TABLET | Freq: Every day | ORAL | Status: DC
Start: 2013-12-03 — End: 2013-12-04
  Administered 2013-12-03 – 2013-12-04 (×2): 40 mg via ORAL
  Filled 2013-12-03 (×2): qty 1

## 2013-12-03 MED ORDER — NITROGLYCERIN 0.4 MG SL SUBL: 0.4000 mg | SUBLINGUAL_TABLET | SUBLINGUAL | Status: DC | PRN

## 2013-12-03 MED ORDER — ATORVASTATIN CALCIUM 10 MG PO TABS
20.0000 mg | ORAL_TABLET | Freq: Every day | ORAL | Status: DC
  Administered 2013-12-03: 20 mg via ORAL
  Filled 2013-12-03 (×3): qty 2

## 2013-12-03 MED ORDER — INSULIN GLARGINE 100 UNIT/ML ~~LOC~~ SOLN
20.0000 [IU] | Freq: Every day | SUBCUTANEOUS | Status: DC
  Administered 2013-12-03: 20 [IU] via SUBCUTANEOUS
  Filled 2013-12-03 (×2): qty 0.2

## 2013-12-03 MED ORDER — INSULIN ASPART 100 UNIT/ML ~~LOC~~ SOLN
0.0000 [IU] | Freq: Three times a day (TID) | SUBCUTANEOUS | Status: DC
  Administered 2013-12-04: 3 [IU] via SUBCUTANEOUS

## 2013-12-03 MED ORDER — APIXABAN 5 MG PO TABS
5.0000 mg | ORAL_TABLET | Freq: Two times a day (BID) | ORAL | Status: DC
  Administered 2013-12-04: 5 mg via ORAL
  Filled 2013-12-03 (×5): qty 1

## 2013-12-03 MED ORDER — SODIUM CHLORIDE 0.9 % IJ SOLN
3.0000 mL | Freq: Two times a day (BID) | INTRAMUSCULAR | Status: DC
  Administered 2013-12-03: 3 mL via INTRAVENOUS

## 2013-12-03 MED ORDER — GLIPIZIDE 5 MG PO TABS
10.0000 mg | ORAL_TABLET | Freq: Two times a day (BID) | ORAL | Status: DC
  Administered 2013-12-04: 10 mg via ORAL
  Filled 2013-12-03 (×4): qty 2

## 2013-12-03 MED ORDER — LEVOTHYROXINE SODIUM 25 MCG PO TABS
125.0000 ug | ORAL_TABLET | Freq: Every day | ORAL | Status: DC
  Administered 2013-12-04: 125 ug via ORAL
  Filled 2013-12-03 (×4): qty 1

## 2013-12-03 MED ORDER — DILTIAZEM HCL ER COATED BEADS 240 MG PO CP24
240.0000 mg | ORAL_CAPSULE | Freq: Every evening | ORAL | Status: DC
  Filled 2013-12-03 (×2): qty 1

## 2013-12-03 MED ORDER — LEVETIRACETAM 500 MG PO TABS
250.0000 mg | ORAL_TABLET | Freq: Two times a day (BID) | ORAL | Status: DC
  Administered 2013-12-03 – 2013-12-04 (×2): 250 mg via ORAL
  Filled 2013-12-03 (×2): qty 1

## 2013-12-03 MED ORDER — INSULIN ASPART 100 UNIT/ML ~~LOC~~ SOLN: 0.0000 [IU] | Freq: Every day | SUBCUTANEOUS | Status: DC

## 2013-12-03 MED ORDER — APIXABAN 5 MG PO TABS
ORAL_TABLET | ORAL | Status: AC
  Filled 2013-12-03: qty 1

## 2013-12-03 MED ORDER — PANCRELIPASE (LIP-PROT-AMYL) 12000-38000 UNITS PO CPEP
1.0000 | ORAL_CAPSULE | ORAL | Status: DC | PRN
  Filled 2013-12-03: qty 1

## 2013-12-03 MED ORDER — NITROGLYCERIN 0.4 MG SL SUBL
0.4000 mg | SUBLINGUAL_TABLET | SUBLINGUAL | Status: AC | PRN
  Administered 2013-12-03 (×3): 0.4 mg via SUBLINGUAL
  Filled 2013-12-03: qty 1

## 2013-12-03 MED ORDER — BACLOFEN 10 MG PO TABS
10.0000 mg | ORAL_TABLET | Freq: Two times a day (BID) | ORAL | Status: DC
  Administered 2013-12-03 – 2013-12-04 (×2): 10 mg via ORAL
  Filled 2013-12-03 (×6): qty 1

## 2013-12-03 MED ORDER — BACLOFEN 10 MG PO TABS
ORAL_TABLET | ORAL | Status: AC
  Filled 2013-12-03: qty 1

## 2013-12-03 MED ORDER — ONDANSETRON HCL 4 MG/2ML IJ SOLN
4.0000 mg | Freq: Once | INTRAMUSCULAR | Status: AC
  Administered 2013-12-03: 4 mg via INTRAVENOUS
  Filled 2013-12-03: qty 2

## 2013-12-03 MED ORDER — FUROSEMIDE 40 MG PO TABS
40.0000 mg | ORAL_TABLET | Freq: Every day | ORAL | Status: DC
  Administered 2013-12-04: 40 mg via ORAL
  Filled 2013-12-03: qty 1

## 2013-12-03 MED ORDER — LORAZEPAM 1 MG PO TABS
1.0000 mg | ORAL_TABLET | Freq: Three times a day (TID) | ORAL | Status: DC | PRN
  Administered 2013-12-03: 1 mg via ORAL
  Filled 2013-12-03: qty 1

## 2013-12-03 MED ORDER — ONDANSETRON HCL 4 MG PO TABS: 8.0000 mg | ORAL_TABLET | Freq: Every day | ORAL | Status: DC | PRN

## 2013-12-03 MED ORDER — METOCLOPRAMIDE HCL 10 MG PO TABS
10.0000 mg | ORAL_TABLET | Freq: Four times a day (QID) | ORAL | Status: DC
  Administered 2013-12-03 – 2013-12-04 (×2): 10 mg via ORAL
  Filled 2013-12-03 (×2): qty 1

## 2013-12-03 MED ORDER — SIMVASTATIN 40 MG PO TABS: 40.0000 mg | ORAL_TABLET | Freq: Every day | ORAL | Status: DC

## 2013-12-03 MED ORDER — METFORMIN HCL 500 MG PO TABS
1000.0000 mg | ORAL_TABLET | Freq: Two times a day (BID) | ORAL | Status: DC
  Administered 2013-12-04: 1000 mg via ORAL
  Filled 2013-12-03: qty 2

## 2013-12-03 MED ORDER — METOPROLOL TARTRATE 50 MG PO TABS
100.0000 mg | ORAL_TABLET | Freq: Two times a day (BID) | ORAL | Status: DC
  Administered 2013-12-03 – 2013-12-04 (×2): 100 mg via ORAL
  Filled 2013-12-03 (×2): qty 2

## 2013-12-03 MED ORDER — INSULIN ASPART 100 UNIT/ML ~~LOC~~ SOLN
6.0000 [IU] | Freq: Three times a day (TID) | SUBCUTANEOUS | Status: DC
  Administered 2013-12-04 (×2): 6 [IU] via SUBCUTANEOUS

## 2013-12-03 MED ORDER — PANCRELIPASE (LIP-PROT-AMYL) 12000-38000 UNITS PO CPEP
2.0000 | ORAL_CAPSULE | Freq: Three times a day (TID) | ORAL | Status: DC
  Administered 2013-12-04 (×2): 2 via ORAL
  Filled 2013-12-03 (×3): qty 2
  Filled 2013-12-03: qty 1
  Filled 2013-12-03 (×3): qty 2

## 2013-12-03 MED ORDER — LOPERAMIDE HCL 2 MG PO CAPS: 4.0000 mg | ORAL_CAPSULE | ORAL | Status: DC | PRN

## 2013-12-03 MED ORDER — MORPHINE SULFATE 2 MG/ML IJ SOLN
2.0000 mg | Freq: Once | INTRAMUSCULAR | Status: AC
  Administered 2013-12-03: 2 mg via INTRAVENOUS
  Filled 2013-12-03: qty 1

## 2013-12-03 MED ORDER — HYDROCODONE-ACETAMINOPHEN 10-325 MG PO TABS
1.0000 | ORAL_TABLET | Freq: Two times a day (BID) | ORAL | Status: DC | PRN
  Administered 2013-12-03 – 2013-12-04 (×2): 1 via ORAL
  Filled 2013-12-03 (×2): qty 1

## 2013-12-03 NOTE — ED Provider Notes (Signed)
CSN: [id number]     Arrival date & time 12/03/13  1740 History  This chart was scribed for [name], MD by [place] Peace, ED Scribe. The patient was seen in [facility]. The patient's care was started at 5:47 PM.      Chief Complaint  Patient presents with  . Chest Pain    (Consider location/radiation/quality/duration/timing/severity/associated sxs/prior Treatment) The history is provided by the patient. No language interpreter was used.   HPI Comments: [name] is a 63 y.o. female with h/o chest pain, a-fib, CHF, and disability due to LBP BIB her husband, who presents to the Emergency Department complaining of sharp, constant central chest pain that she reports started 45 minutes ago while she was sitting down. She states that the pain radiates to the surrounding chest. She denies deep breathing and palpation exacerbating her pain. She further states that she has had a stress test done about 12 or 14 years ago. She denies h/o heart attack, stents, bypass surgery. She states she is a diabetic and is on Insulin and cholesterol medication. She also states that she is on Eloquin 2x a day. She denies any SOB, nausea or any other associated symptoms.  Past Medical History  Diagnosis Date  . Chest pain     Negative cardiac catheterization in 2002; negative stress nuclear study in 2008  . Congestive heart failure     Normal LV systolic function  . PAF (paroxysmal atrial fibrillation) 2009  . Chronic anticoagulation   . Diabetes mellitus, type 2     Insulin therapy; exacerbated by prednisone  . Syncope     Admitted 05/2009; magnetic resonance imagin/ MRA - negative; etiology thought to be orthostasis secondary to drugs and dehydration  . GERD (gastroesophageal reflux disease)   . IBS (irritable bowel syndrome)   . Hiatal hernia   . Gastroparesis     99% retention 05/2008 on GES  . Hyperlipidemia   . Hypertension   . Hypothyroid   . Chronic LBP     Surgical intervention in 1996  .  Obesity   . OSA on CPAP   . Anemia     H/H of 10/30 with a normal MCV in 12/09  . Barrett's esophagus     Diagnosed 1995. Last EGD 2008.   . Seizures    Past Surgical History  Procedure Laterality Date  . Cardiovascular stress test  2008    Stress nuclear study  . Back surgery  1996  . Laparoscopic cholecystectomy  1990s  . Total abdominal hysterectomy  1999  . Laminectomy  1995    L4-L5  . Carpal tunnel release  1994  . Esophagogastroduodenoscopy  2008    Barrett's without dysplasia. esphagus dilated. antral erosions, h.pylori serologies negative.  . Colonoscopy  11/2011    Dr. Darrick Penna: Internal hemorrhoids, mild diverticulosis. Random colon biopsies negative.  . Givens capsule study  12/07/2011    Proximal small bowel, rare AVM. Distal small bowel, multiple ulcers noted  . Cardiac catheterization  2002  . Esophagogastroduodenoscopy  11/2011    Dr. Darrick Penna: Barrett's esophagus, mild gastritis, diverticulum in the second portion of the duodenum repeat EGD 3 years. Small bowel biopsies negative. Gastric biopsy show reactive gastropathy but no H. pylori. Esophageal biopsies consistent with GERD.  [employer] capsule study N/A 09/27/2013    Procedure: GIVENS CAPSULE STUDY;  Surgeon: West Bali, MD;  Location: [facility];  Service: Endoscopy;  Laterality: N/A;  7:30  . Givens capsule study N/A 10/10/2013  Procedure: GIVENS CAPSULE STUDY;  Surgeon: West Bali, MD;  Location: [facility];  Service: Endoscopy;  Laterality: N/A;  7:30  . Colonoscopy N/A 11/08/2013    Procedure: COLONOSCOPY WITH ILEOSCOPY;  Surgeon: West Bali, MD;  Location: [facility];  Service: Endoscopy;  Laterality: N/A;  9:30 AM  . Esophageal biopsy N/A 11/08/2013    Procedure: BIOPSY  / Tissue sampling / ulcers present in small intestine;  Surgeon: West Bali, MD;  Location: [facility];  Service: Endoscopy;  Laterality: N/A;   Family History  Problem Relation Age of Onset  . Hypertension Mother   .  Alzheimer's disease Mother   . Stroke Mother   . Heart attack Mother   . Heart disease Neg Hx   . Hypertension Other   . Colon cancer Neg Hx    History  Substance Use Topics  . Smoking status: Former Smoker -- 15 years    Quit date: 07/01/1983  . Smokeless tobacco: Never Used     Comment: Remote  . Alcohol Use: No     Comment: last etoh one year ago, never frequent   OB History   Grav Para Term Preterm Abortions TAB SAB Ect Mult Living                 Review of Systems A complete 10 system review of systems was obtained and all systems are negative except as noted in the HPI and PMH.   Allergies  Ibuprofen; Celexa; and Codeine  Home Medications   Prior to Admission medications   Medication Sig Start Date End Date Taking? Authorizing Provider  acetaminophen (TYLENOL ARTHRITIS PAIN) 650 MG CR tablet Take 1,300 mg by mouth every 8 (eight) hours as needed for pain.    Historical Provider, MD  diltiazem (CARDIZEM CD) 240 MG 24 hr capsule Take 240 mg by mouth every evening. 07/04/13   [name], MD  glipiZIDE (GLUCOTROL) 10 MG tablet Take 10 mg by mouth 2 (two) times daily.     Historical Provider, MD  Insulin Glargine (LANTUS SOLOSTAR) 100 UNIT/ML Solostar Pen Inject 20-22 Units into the skin at bedtime.     Historical Provider, MD  levETIRAcetam (KEPPRA) 250 MG tablet Take 1 tablet (250 mg total) by mouth 2 (two) times daily. 11/10/12   [name], MD  levothyroxine (SYNTHROID, LEVOTHROID) 125 MCG tablet Take 125 mcg by mouth daily.    Historical Provider, MD  loperamide (IMODIUM) 2 MG capsule Take 4 mg by mouth as needed for diarrhea or loose stools.     Historical Provider, MD  LORazepam (ATIVAN) 1 MG tablet Take 1 mg by mouth 3 (three) times daily as needed for anxiety or sleep. For anxiety    Historical Provider, MD  metaxalone (SKELAXIN) 800 MG tablet Take 800 mg by mouth 3 (three) times daily. 08/24/13   Historical Provider, MD  metFORMIN (GLUCOPHAGE) 500 MG  tablet Take 1,000 mg by mouth 2 (two) times daily with a meal.     Historical Provider, MD  metoCLOPramide (REGLAN) 10 MG tablet Take 10 mg by mouth 4 (four) times daily.  09/20/12   Historical Provider, MD  metoprolol (LOPRESSOR) 100 MG tablet Take 100 mg by mouth 2 (two) times daily.    Historical Provider, MD  nitroGLYCERIN (NITROSTAT) 0.4 MG SL tablet Place 0.4 mg under the tongue every 5 (five) minutes as needed for chest pain. For chest pain     Historical Provider, MD  omeprazole (  PRILOSEC) 20 MG capsule Take 20 mg by mouth 2 (two) times daily.      Historical Provider, MD  ondansetron (ZOFRAN) 8 MG tablet Take 8 mg by mouth daily as needed. nausea 11/01/11   Historical Provider, MD  potassium chloride SA (K-DUR,KLOR-CON) 20 MEQ tablet Take 20 mEq by mouth 3 (three) times daily.     Historical Provider, MD  simvastatin (ZOCOR) 40 MG tablet Take 40 mg by mouth at bedtime.     Historical Provider, MD   BP 131/63  Pulse 82  Temp(Src) 98.3 F (36.8 C) (Oral)  Resp 22  Ht 5\' 8"  (1.727 m)  Wt 195 lb (88.451 kg)  BMI 29.66 kg/m2  SpO2 97% Physical Exam  Constitutional: She is oriented to person, place, and time. She appears well-developed and well-nourished.  HENT:  Head: Normocephalic and atraumatic.  Eyes: Conjunctivae and EOM are normal.  Neck: Normal range of motion. Neck supple.  Cardiovascular: Normal rate and intact distal pulses.   No murmur heard. Heartbeat is irregularly irregular.   Pulmonary/Chest: Effort normal and breath sounds normal.  Musculoskeletal: Normal range of motion. She exhibits no edema.  Neurological: She is alert and oriented to person, place, and time.  Skin: Skin is warm and dry.    ED Course  Procedures (including critical care time) DIAGNOSTIC STUDIES: Oxygen Saturation is 97% on room air, normal by my interpretation.    COORDINATION OF CARE: 5:53 PM- Treatment plan was discussed with patient who verbalizes understanding and agrees.   Results  for orders placed during the hospital encounter of 12/03/13  CBC WITH DIFFERENTIAL      Result Value Ref Range   WBC 5.1  4.0 - 10.5 K/uL   RBC 3.75 (*) 3.87 - 5.11 MIL/uL   Hemoglobin 10.8 (*) 12.0 - 15.0 g/dL   HCT 11.9 (*) 14.7 - 82.9 %   MCV 89.3  78.0 - 100.0 fL   MCH 28.8  26.0 - 34.0 pg   MCHC 32.2  30.0 - 36.0 g/dL   RDW 56.2  13.0 - 86.5 %   Platelets 189  150 - 400 K/uL   Neutrophils Relative % 59  43 - 77 %   Neutro Abs 3.0  1.7 - 7.7 K/uL   Lymphocytes Relative 32  12 - 46 %   Lymphs Abs 1.7  0.7 - 4.0 K/uL   Monocytes Relative 9  3 - 12 %   Monocytes Absolute 0.5  0.1 - 1.0 K/uL   Eosinophils Relative 0  0 - 5 %   Eosinophils Absolute 0.0  0.0 - 0.7 K/uL   Basophils Relative 0  0 - 1 %   Basophils Absolute 0.0  0.0 - 0.1 K/uL  COMPREHENSIVE METABOLIC PANEL      Result Value Ref Range   Sodium 139  137 - 147 mEq/L   Potassium 3.8  3.7 - 5.3 mEq/L   Chloride 102  96 - 112 mEq/L   CO2 21  19 - 32 mEq/L   Glucose, Bld 312 (*) 70 - 99 mg/dL   BUN 21  6 - 23 mg/dL   Creatinine, Ser 7.84  0.50 - 1.10 mg/dL   Calcium 8.3 (*) 8.4 - 10.5 mg/dL   Total Protein 6.1  6.0 - 8.3 g/dL   Albumin 3.1 (*) 3.5 - 5.2 g/dL   AST 13  0 - 37 U/L   ALT 7  0 - 35 U/L   Alkaline Phosphatase 52  39 - 117  U/L   Total Bilirubin 0.4  0.3 - 1.2 mg/dL   GFR calc non Af Amer >90  >90 mL/min   GFR calc Af Amer >90  >90 mL/min  TROPONIN I      Result Value Ref Range   Troponin I <0.30  <0.30 ng/mL   Dg Chest 2 View  12/03/2013   CLINICAL DATA:  Chest pain, CHF  EXAM: CHEST  2 VIEW  COMPARISON:  CT chest dated 10/13/2013  FINDINGS: No frank interstitial edema. Elevation of the left hemidiaphragm with possible small left pleural effusion. No pneumothorax.  Stable cardiomegaly.  Degenerative changes of the visualized thoracolumbar spine.  IMPRESSION: No frank interstitial edema.  Possible small left pleural effusion.   Electronically Signed   By: Charline BillsSriyesh  Krishnan M.D.   On: 12/03/2013 18:28        EKG Interpretation   Date/Time:  Tuesday December 03 2013 17:41:33 EDT Ventricular Rate:  89 PR Interval:    QRS Duration: 101 QT Interval:  451 QTC Calculation: 549 R Axis:   -30 Text Interpretation:  Atrial fibrillation Left axis deviation Abnormal  R-wave progression, early transition Borderline T wave abnormalities  Prolonged QT interval No significant change since last tracing Confirmed  by DELOS  MD, Tequlia Gonsalves ([id number]) on 12/03/2013 6:09:02 PM      MDM   Final diagnoses:  None    Patient is a 63 year old female with no prior cardiac history but multiple cardiac risk factors. She presents today with complaints of chest discomfort. Her symptoms are somewhat atypical for cardiac pain and workup is unremarkable including EKG and troponin. However do to her degree of risk factors, I feel as though overnight admission and rule out for MI as appropriate. I've spoken with Dr. Karilyn CotaGosrani who agrees to admit.  I personally performed the services described in this documentation, which was scribed in my presence. The recorded information has been reviewed and is accurate.      Geoffery Lyonsouglas Nivin Braniff, MD 12/03/13 2000

## 2013-12-03 NOTE — ED Notes (Addendum)
Pt c/o onset of chest pain approx 45 min PTA. Pt has cardiac hx. Reports she did not have any Nitro SL at home.

## 2013-12-03 NOTE — ED Notes (Signed)
Patient taken to restroom via wheelchair.RN had just given morphine to patient. Unsteady gait. Patient placed back on cardiac monitoring.

## 2013-12-03 NOTE — H&P (Signed)
[facility] History and Physical  [name] UJW:[id number] DOB: 23-Jul-1950 DOA: 12/03/2013  Referring physician: ER. PCP: [name], MD   Chief Complaint: Chest pain.  HPI: [name] is a 63 y.o. female  This is a 63 year old obese, diabetic patient who has a history of atrial fibrillation chronically who presents with chest pain today. The pain started around 4 PM today, approximately 4 hours ago and it has been constant. The pain started at rest and has continued. She says she feels nauseous with it but there is no sweating. She has had pain like this previously but I'm not sure how much she has been investigated for it. She is now being admitted for further investigation.   Review of Systems:  Constitutional:  No weight loss, night sweats, Fevers, chills, fatigue.  HEENT:  No headaches, Difficulty swallowing,Tooth/dental problems,Sore throat,  No sneezing, itching, ear ache, nasal congestion, post nasal drip,   GI:  No heartburn, indigestion, abdominal pain, nausea, vomiting, diarrhea, change in bowel habits, loss of appetite  Resp:  No shortness of breath with exertion or at rest. No excess mucus, no productive cough, No non-productive cough, No coughing up of blood.No change in color of mucus.No wheezing.No chest wall deformity  Skin:  no rash or lesions.  GU:  no dysuria, change in color of urine, no urgency or frequency. No flank pain.  Musculoskeletal:  No joint pain or swelling. No decreased range of motion. No back pain.  Psych:  No change in mood or affect. No depression or anxiety. No memory loss.   Past Medical History  Diagnosis Date  . Chest pain     Negative cardiac catheterization in 2002; negative stress nuclear study in 2008  . Congestive heart failure     Normal LV systolic function  . PAF (paroxysmal atrial fibrillation) 2009  . Chronic anticoagulation   . Diabetes mellitus, type 2     Insulin therapy; exacerbated by prednisone  .  Syncope     Admitted 05/2009; magnetic resonance imagin/ MRA - negative; etiology thought to be orthostasis secondary to drugs and dehydration  . GERD (gastroesophageal reflux disease)   . IBS (irritable bowel syndrome)   . Hiatal hernia   . Gastroparesis     99% retention 05/2008 on GES  . Hyperlipidemia   . Hypertension   . Hypothyroid   . Chronic LBP     Surgical intervention in 1996  . Obesity   . OSA on CPAP   . Anemia     H/H of 10/30 with a normal MCV in 12/09  . Barrett's esophagus     Diagnosed 1995. Last EGD 2008.   . Seizures    Past Surgical History  Procedure Laterality Date  . Cardiovascular stress test  2008    Stress nuclear study  . Back surgery  1996  . Laparoscopic cholecystectomy  1990s  . Total abdominal hysterectomy  1999  . Laminectomy  1995    L4-L5  . Carpal tunnel release  1994  . Esophagogastroduodenoscopy  2008    Barrett's without dysplasia. esphagus dilated. antral erosions, h.pylori serologies negative.  . Colonoscopy  11/2011    Dr. Darrick Penna: Internal hemorrhoids, mild diverticulosis. Random colon biopsies negative.  . Givens capsule study  12/07/2011    Proximal small bowel, rare AVM. Distal small bowel, multiple ulcers noted  . Cardiac catheterization  2002  . Esophagogastroduodenoscopy  11/2011    Dr. Darrick Penna: Barrett's esophagus, mild gastritis, diverticulum in the second portion  of the duodenum repeat EGD 3 years. Small bowel biopsies negative. Gastric biopsy show reactive gastropathy but no H. pylori. Esophageal biopsies consistent with GERD.  [employer] capsule study N/A 09/27/2013    Procedure: GIVENS CAPSULE STUDY;  Surgeon: West Bali, MD;  [facility];  Service: Endoscopy;  Laterality: N/A;  7:30  . Givens capsule study N/A 10/10/2013    Procedure: GIVENS CAPSULE STUDY;  Surgeon: West Bali, MD;  [facility]: [facility];  Service: Endoscopy;  Laterality: N/A;  7:30  . Colonoscopy N/A 11/08/2013    Procedure: COLONOSCOPY WITH  ILEOSCOPY;  Surgeon: West Bali, MD;  [facility]: [facility];  Service: Endoscopy;  Laterality: N/A;  9:30 AM  . Esophageal biopsy N/A 11/08/2013    Procedure: BIOPSY  / Tissue sampling / ulcers present in small intestine;  Surgeon: West Bali, MD;  Location: [facility];  Service: Endoscopy;  Laterality: N/A;   Social History:  reports that she quit smoking about 30 years ago. She has never used smokeless tobacco. She reports that she does not drink alcohol or use illicit drugs.  Allergies  Allergen Reactions  . Ibuprofen     Upsets her stomach and causes abdominal pain  . Celexa [Citalopram Hydrobromide]   . Codeine Nausea And Vomiting    HALLUCINATIONS    Family History  Problem Relation Age of Onset  . Hypertension Mother   . Alzheimer's disease Mother   . Stroke Mother   . Heart attack Mother   . Heart disease Neg Hx   . Hypertension Other   . Colon cancer Neg Hx      Prior to Admission medications   Medication Sig Start Date End Date Taking? Authorizing Provider  acetaminophen (TYLENOL ARTHRITIS PAIN) 650 MG CR tablet Take 1,300 mg by mouth every 8 (eight) hours as needed for pain.   Yes Historical Provider, MD  baclofen (LIORESAL) 10 MG tablet Take 10 mg by mouth 2 (two) times daily. 11/15/13  Yes Historical Provider, MD  diltiazem (CARDIZEM CD) 240 MG 24 hr capsule Take 240 mg by mouth every evening. 07/04/13  Yes [name], MD  ELIQUIS 5 MG TABS tablet Take 5 mg by mouth 2 (two) times daily. 11/23/13  Yes Historical Provider, MD  furosemide (LASIX) 40 MG tablet Take 40 mg by mouth daily. 12/03/13  Yes Historical Provider, MD  glipiZIDE (GLUCOTROL) 10 MG tablet Take 10 mg by mouth 2 (two) times daily.    Yes Historical Provider, MD  HYDROcodone-acetaminophen (NORCO) 10-325 MG per tablet Take 1 tablet by mouth 2 (two) times daily as needed.  For pain 10/23/13  Yes Historical Provider, MD  Insulin Glargine (LANTUS SOLOSTAR) 100 UNIT/ML Solostar Pen Inject 20-22  Units into the skin at bedtime.    Yes Historical Provider, MD  levETIRAcetam (KEPPRA) 250 MG tablet Take 1 tablet (250 mg total) by mouth 2 (two) times daily. 11/10/12  Yes [name], MD  levothyroxine (SYNTHROID, LEVOTHROID) 125 MCG tablet Take 125 mcg by mouth daily.   Yes Historical Provider, MD  lipase/protease/amylase (CREON-12/PANCREASE) 12000 UNITS CPEP capsule Take 1-2 capsules by mouth 3 (three) times daily with meals. 2 with meals and 1 with snacks   Yes Historical Provider, MD  loperamide (IMODIUM) 2 MG capsule Take 4 mg by mouth as needed for diarrhea or loose stools.    Yes Historical Provider, MD  LORazepam (ATIVAN) 1 MG tablet Take 1 mg by mouth 3 (three) times daily  as needed for anxiety or sleep. For anxiety   Yes Historical Provider, MD  metFORMIN (GLUCOPHAGE) 500 MG tablet Take 1,000 mg by mouth 2 (two) times daily with a meal.    Yes Historical Provider, MD  metoCLOPramide (REGLAN) 10 MG tablet Take 10 mg by mouth 4 (four) times daily.  09/20/12  Yes Historical Provider, MD  metoprolol (LOPRESSOR) 100 MG tablet Take 100 mg by mouth 2 (two) times daily.   Yes Historical Provider, MD  omeprazole (PRILOSEC) 20 MG capsule Take 20 mg by mouth 2 (two) times daily.     Yes Historical Provider, MD  potassium chloride SA (K-DUR,KLOR-CON) 20 MEQ tablet Take 20 mEq by mouth 3 (three) times daily.    Yes Historical Provider, MD  simvastatin (ZOCOR) 40 MG tablet Take 40 mg by mouth at bedtime.    Yes Historical Provider, MD  nitroGLYCERIN (NITROSTAT) 0.4 MG SL tablet Place 0.4 mg under the tongue every 5 (five) minutes as needed for chest pain. For chest pain     Historical Provider, MD  ondansetron (ZOFRAN) 8 MG tablet Take 8 mg by mouth daily as needed. nausea 11/01/11   Historical Provider, MD   Physical Exam: Filed Vitals:   12/03/13 1930  BP: 120/63  Pulse: 87  Temp:   Resp: 16    BP 120/63  Pulse 87  Temp(Src) 98.3 F (36.8 C) (Oral)  Resp 16  Ht 5\' 8"  (1.727 m)  Wt  88.451 kg (195 lb)  BMI 29.66 kg/m2  SpO2 95%  General:  Appears calm and comfortable Eyes: PERRL, normal lids, irises & conjunctiva ENT: grossly normal hearing, lips & tongue Neck: no LAD, masses or thyromegaly Cardiovascular: Atrial fibrillation. Heart sounds are present without any murmurs. Jugular venous pressure is not elevated.. Telemetry: Atrial fibrillation, rate controlled. Respiratory: CTA bilaterally, no w/r/r. Normal respiratory effort. Abdomen: soft, ntnd Skin: no rash or induration seen on limited exam Musculoskeletal: grossly normal tone BUE/BLE .She appears to have anterior chest wall tenderness. Psychiatric: grossly normal mood and affect, speech fluent and appropriate Neurologic: grossly non-focal.          Labs on Admission:  Basic Metabolic Panel:  Recent Labs Lab 12/03/13 1750  NA 139  K 3.8  CL 102  CO2 21  GLUCOSE 312*  BUN 21  CREATININE 0.58  CALCIUM 8.3*   Liver Function Tests:  Recent Labs Lab 12/03/13 1750  AST 13  ALT 7  ALKPHOS 52  BILITOT 0.4  PROT 6.1  ALBUMIN 3.1*     CBC:  Recent Labs Lab 12/03/13 1750  WBC 5.1  NEUTROABS 3.0  HGB 10.8*  HCT 33.5*  MCV 89.3  PLT 189   Cardiac Enzymes:  Recent Labs Lab 12/03/13 1750  TROPONINI <0.30    BNP (last 3 results)  Recent Labs  06/15/13 1645 06/29/13 1718 10/13/13 0947  PROBNP 3732.0* 5072.0* 3088.0*   CBG: No results found for this basename: GLUCAP,  in the last 168 hours  Radiological Exams on Admission: Dg Chest 2 View  12/03/2013   CLINICAL DATA:  Chest pain, CHF  EXAM: CHEST  2 VIEW  COMPARISON:  CT chest dated 10/13/2013  FINDINGS: No frank interstitial edema. Elevation of the left hemidiaphragm with possible small left pleural effusion. No pneumothorax.  Stable cardiomegaly.  Degenerative changes of the visualized thoracolumbar spine.  IMPRESSION: No frank interstitial edema.  Possible small left pleural effusion.   Electronically Signed   By: Charline BillsSriyesh   Krishnan M.D.   On:  12/03/2013 18:28    EKG: Independently reviewed. Atrial fibrillation, no acute ST-T wave elevation.  Assessment/Plan   1. Chest pain, probably musculoskeletal in origin but she has multiple risk factors for coronary artery disease. 2. Type 2 diabetes mellitus. 3. Hypertension. 4. Chronic atrial fibrillation on anticoagulation. 5. Obesity.  Plan: 1. Admit to telemetry floor. 2. Serial cardiac enzymes. 3. May well need cardiology consultation as an outpatient depending on clinical course. 4. Monitor and control diabetes.  Further recommendations will depend on patient's hospital progress.   Code Status: Full code.  Family Communication: I discussed the plan with the patient at the bedside.  Disposition Plan: Home when medically stable, probably tomorrow.  Time spent: 60 minutes.  Wilson Singer [facility] Pager [phone].  **Disclaimer: This note may have been dictated with voice recognition software. Similar sounding words can inadvertently be transcribed and this note may contain transcription errors which may not have been corrected upon publication of note.**

## 2013-12-04 LAB — COMPREHENSIVE METABOLIC PANEL
ALBUMIN: 3 g/dL — AB (ref 3.5–5.2)
ALK PHOS: 50 U/L (ref 39–117)
ALT: 6 U/L (ref 0–35)
AST: 11 U/L (ref 0–37)
BUN: 16 mg/dL (ref 6–23)
CHLORIDE: 107 meq/L (ref 96–112)
CO2: 27 meq/L (ref 19–32)
Calcium: 8.2 mg/dL — ABNORMAL LOW (ref 8.4–10.5)
Creatinine, Ser: 0.63 mg/dL (ref 0.50–1.10)
GLUCOSE: 155 mg/dL — AB (ref 70–99)
POTASSIUM: 4 meq/L (ref 3.7–5.3)
Sodium: 145 mEq/L (ref 137–147)
Total Bilirubin: 0.4 mg/dL (ref 0.3–1.2)
Total Protein: 5.8 g/dL — ABNORMAL LOW (ref 6.0–8.3)

## 2013-12-04 LAB — CBC
HCT: 31.2 % — ABNORMAL LOW (ref 36.0–46.0)
Hemoglobin: 9.9 g/dL — ABNORMAL LOW (ref 12.0–15.0)
MCH: 28.6 pg (ref 26.0–34.0)
MCHC: 31.7 g/dL (ref 30.0–36.0)
MCV: 90.2 fL (ref 78.0–100.0)
Platelets: 147 10*3/uL — ABNORMAL LOW (ref 150–400)
RBC: 3.46 MIL/uL — AB (ref 3.87–5.11)
RDW: 15.5 % (ref 11.5–15.5)
WBC: 6 10*3/uL (ref 4.0–10.5)

## 2013-12-04 LAB — GLUCOSE, CAPILLARY
GLUCOSE-CAPILLARY: 153 mg/dL — AB (ref 70–99)
Glucose-Capillary: 92 mg/dL (ref 70–99)

## 2013-12-04 LAB — TROPONIN I
Troponin I: <0.3 ng/mL (ref <0.30)
Troponin I: <0.3 ng/mL (ref <0.30)

## 2013-12-04 LAB — HEMOGLOBIN A1C
HEMOGLOBIN A1C: 6.4 % — AB (ref <5.7)
Mean Plasma Glucose: 137 mg/dL — ABNORMAL HIGH (ref <117)

## 2013-12-04 LAB — TSH: TSH: 0.136 u[IU]/mL — AB (ref 0.350–4.500)

## 2013-12-04 NOTE — Discharge Summary (Signed)
Physician Discharge Summary  Patient ID: [name] MRN: [id number] DOB/AGE: 1950/10/18 63 y.o. Primary Care Physician:KNOWLTON,STEPHEN D, MD Admit date: 12/03/2013 Discharge date: 12/04/2013    Discharge Diagnoses:  1. Chest pain, likely musculoskeletal in nature. Risk factors for coronary artery disease. 2.Hypertension. Controlled. 3.Atrial fibrillation, rate controlled. On chronic anticoagulation therapy. 4.Anemia. Chronic. 5. Obesity.     Medication List         baclofen 10 MG tablet  Commonly known as:  LIORESAL  Take 10 mg by mouth 2 (two) times daily.     diltiazem 240 MG 24 hr capsule  Commonly known as:  CARDIZEM CD  Take 240 mg by mouth every evening.     ELIQUIS 5 MG Tabs tablet  Generic drug:  apixaban  Take 5 mg by mouth 2 (two) times daily.     furosemide 40 MG tablet  Commonly known as:  LASIX  Take 40 mg by mouth daily.     glipiZIDE 10 MG tablet  Commonly known as:  GLUCOTROL  Take 10 mg by mouth 2 (two) times daily.     HYDROcodone-acetaminophen 10-325 MG per tablet  Commonly known as:  NORCO  Take 1 tablet by mouth 2 (two) times daily as needed.  For pain     LANTUS SOLOSTAR 100 UNIT/ML Solostar Pen  Generic drug:  Insulin Glargine  Inject 22 Units into the skin at bedtime.     levETIRAcetam 250 MG tablet  Commonly known as:  KEPPRA  Take 1 tablet (250 mg total) by mouth 2 (two) times daily.     levothyroxine 125 MCG tablet  Commonly known as:  SYNTHROID, LEVOTHROID  Take 125 mcg by mouth daily.     lipase/protease/amylase 40981 UNITS Cpep capsule  Commonly known as:  CREON-12/PANCREASE  Take 1-2 capsules by mouth 3 (three) times daily with meals. 2 with meals and 1 with snacks     loperamide 2 MG capsule  Commonly known as:  IMODIUM  Take 4 mg by mouth as needed for diarrhea or loose stools.     LORazepam 1 MG tablet  Commonly known as:  ATIVAN  Take 1 mg by mouth 3 (three) times daily as needed for anxiety or sleep. For anxiety     metFORMIN 500 MG tablet  Commonly known as:  GLUCOPHAGE  Take 1,000 mg by mouth 2 (two) times daily with a meal.     metoCLOPramide 10 MG tablet  Commonly known as:  REGLAN  Take 10 mg by mouth 4 (four) times daily.     metoprolol 100 MG tablet  Commonly known as:  LOPRESSOR  Take 100 mg by mouth 2 (two) times daily.     nitroGLYCERIN 0.4 MG SL tablet  Commonly known as:  NITROSTAT  - Place 0.4 mg under the tongue every 5 (five) minutes as needed for chest pain. For chest pain  -      omeprazole 20 MG capsule  Commonly known as:  PRILOSEC  Take 20 mg by mouth 2 (two) times daily.     ondansetron 8 MG tablet  Commonly known as:  ZOFRAN  Take 8 mg by mouth daily as needed. nausea     potassium chloride SA 20 MEQ tablet  Commonly known as:  K-DUR,KLOR-CON  Take 20 mEq by mouth 3 (three) times daily.     simvastatin 40 MG tablet  Commonly known as:  ZOCOR  Take 40 mg by mouth at bedtime.     TYLENOL ARTHRITIS PAIN  650 MG CR tablet  Generic drug:  acetaminophen  Take 1,300 mg by mouth every 8 (eight) hours as needed for pain.        Discharged Condition: Stable and improving.    Consults: None.  Significant Diagnostic Studies: Dg Chest 2 View  12/03/2013   CLINICAL DATA:  Chest pain, CHF  EXAM: CHEST  2 VIEW  COMPARISON:  CT chest dated 10/13/2013  FINDINGS: No frank interstitial edema. Elevation of the left hemidiaphragm with possible small left pleural effusion. No pneumothorax.  Stable cardiomegaly.  Degenerative changes of the visualized thoracolumbar spine.  IMPRESSION: No frank interstitial edema.  Possible small left pleural effusion.   Electronically Signed   By: Charline BillsSriyesh  Krishnan M.D.   On: 12/03/2013 18:28    Lab Results: Basic Metabolic Panel:  Recent Labs  14/78/2904/08/18 1750 12/04/13 0618  NA 139 145  K 3.8 4.0  CL 102 107  CO2 21 27  GLUCOSE 312* 155*  BUN 21 16  CREATININE 0.58 0.63  CALCIUM 8.3* 8.2*   Liver Function Tests:  Recent Labs   12/03/13 1750 12/04/13 0618  AST 13 11  ALT 7 6  ALKPHOS 52 50  BILITOT 0.4 0.4  PROT 6.1 5.8*  ALBUMIN 3.1* 3.0*     CBC:  Recent Labs  12/03/13 1750 12/04/13 0618  WBC 5.1 6.0  NEUTROABS 3.0  --   HGB 10.8* 9.9*  HCT 33.5* 31.2*  MCV 89.3 90.2  PLT 189 147*    No results found for this or any previous visit (from the past 240 hour(s)).   Hospital Course: This is a 63 year old lady who presents to the hospital yesterday with chest pain. Please see initial history as outlined below: HPI: [name] is a 63 y.o. female  This is a 63 year old obese, diabetic patient who has a history of atrial fibrillation chronically who presents with chest pain today. The pain started around 4 PM today, approximately 4 hours ago and it has been constant. The pain started at rest and has continued. She says she feels nauseous with it but there is no sweating. She has had pain like this previously but I'm not sure how much she has been investigated for it. She is now being admitted for further investigation. Overnight she has been stable. Cardiac enzymes are all negative. She says the pain appears to be somewhat better. She stable for discharge and will need followup as an outpatient with cardiology. She may need an outpatient stress test because of risk factors. Discharge Exam: Blood pressure 145/92, pulse 72, temperature 98 F (36.7 C), temperature source Oral, resp. rate 18, height 5\' 8"  (1.727 m), weight 89.086 kg (196 lb 6.4 oz), SpO2 96.00%. She looks systemically well, somewhat anxious. Heart sounds are present without gallop rhythm. She is tender in the anterior chest wall, reproducing her pain. Lung fields are clear. She is alert and oriented.  Disposition: Home.      Discharge Instructions   Diet - low sodium heart healthy    Complete by:  As directed      Increase activity slowly    Complete by:  As directed            Follow-up Information   Follow up with Prentice DockerKONESWARAN,  SURESH A, MD. Schedule an appointment as soon as possible for a visit in 1 week.   Specialty:  Cardiology   Contact information:   [address] [place] [address] [phone]  Signed: Wilson Singer   12/04/2013, 8:33 AM

## 2013-12-04 NOTE — Progress Notes (Signed)
Ms,[name] discharged today with instructions given medications,and follow up visits,patient,and daughter verbalized understanding.Vital signs stable.Patient accompanied by staff to an awaiting vehicle.

## 2013-12-16 ENCOUNTER — Telehealth: Payer: Self-pay | Admitting: Cardiovascular Disease

## 2013-12-16 MED ORDER — APIXABAN 5 MG PO TABS: 5.0000 mg | ORAL_TABLET | Freq: Two times a day (BID) | ORAL | Status: DC

## 2013-12-16 NOTE — Telephone Encounter (Signed)
Refill complete 

## 2013-12-16 NOTE — Telephone Encounter (Signed)
Received fax refill request  Rx # [id number] Medication:  Eliquis 5 mg tablet Qty 60 Sig:  Take one tablet by mouth twice daily Physician:  Purvis Sheffield

## 2013-12-19 ENCOUNTER — Encounter: Payer: Medicare Other | Admitting: Adult Health

## 2013-12-19 ENCOUNTER — Encounter (HOSPITAL_COMMUNITY): Payer: Self-pay | Admitting: Emergency Medicine

## 2013-12-19 ENCOUNTER — Observation Stay (HOSPITAL_COMMUNITY)
Admission: EM | Admit: 2013-12-19 | Discharge: 2013-12-21 | Disposition: A | Discharge status: Home / Self Care | Payer: Medicare Other | Attending: Internal Medicine | Admitting: Internal Medicine

## 2013-12-19 ENCOUNTER — Emergency Department (HOSPITAL_COMMUNITY): Payer: Medicare Other

## 2013-12-19 DIAGNOSIS — D649 Anemia, unspecified: Secondary | ICD-10-CM | POA: Insufficient documentation

## 2013-12-19 DIAGNOSIS — I509 Heart failure, unspecified: Secondary | ICD-10-CM | POA: Insufficient documentation

## 2013-12-19 DIAGNOSIS — G40909 Epilepsy, unspecified, not intractable, without status epilepticus: Secondary | ICD-10-CM | POA: Insufficient documentation

## 2013-12-19 DIAGNOSIS — I482 Chronic atrial fibrillation, unspecified: Secondary | ICD-10-CM

## 2013-12-19 DIAGNOSIS — E119 Type 2 diabetes mellitus without complications: Secondary | ICD-10-CM

## 2013-12-19 DIAGNOSIS — R634 Abnormal weight loss: Secondary | ICD-10-CM

## 2013-12-19 DIAGNOSIS — R079 Chest pain, unspecified: Secondary | ICD-10-CM | POA: Diagnosis present

## 2013-12-19 DIAGNOSIS — Z6841 Body Mass Index (BMI) 40.0 and over, adult: Secondary | ICD-10-CM | POA: Diagnosis present

## 2013-12-19 DIAGNOSIS — Z87891 Personal history of nicotine dependence: Secondary | ICD-10-CM | POA: Insufficient documentation

## 2013-12-19 DIAGNOSIS — J984 Other disorders of lung: Secondary | ICD-10-CM

## 2013-12-19 DIAGNOSIS — K449 Diaphragmatic hernia without obstruction or gangrene: Secondary | ICD-10-CM | POA: Insufficient documentation

## 2013-12-19 DIAGNOSIS — Z9889 Other specified postprocedural states: Secondary | ICD-10-CM | POA: Insufficient documentation

## 2013-12-19 DIAGNOSIS — R002 Palpitations: Secondary | ICD-10-CM | POA: Insufficient documentation

## 2013-12-19 DIAGNOSIS — R197 Diarrhea, unspecified: Secondary | ICD-10-CM

## 2013-12-19 DIAGNOSIS — I951 Orthostatic hypotension: Secondary | ICD-10-CM

## 2013-12-19 DIAGNOSIS — Z8669 Personal history of other diseases of the nervous system and sense organs: Secondary | ICD-10-CM

## 2013-12-19 DIAGNOSIS — R0600 Dyspnea, unspecified: Secondary | ICD-10-CM

## 2013-12-19 DIAGNOSIS — K3184 Gastroparesis: Secondary | ICD-10-CM

## 2013-12-19 DIAGNOSIS — G4730 Sleep apnea, unspecified: Secondary | ICD-10-CM

## 2013-12-19 DIAGNOSIS — K2270 Barrett's esophagus without dysplasia: Secondary | ICD-10-CM | POA: Insufficient documentation

## 2013-12-19 DIAGNOSIS — E669 Obesity, unspecified: Secondary | ICD-10-CM | POA: Insufficient documentation

## 2013-12-19 DIAGNOSIS — Z5181 Encounter for therapeutic drug level monitoring: Secondary | ICD-10-CM

## 2013-12-19 DIAGNOSIS — I10 Essential (primary) hypertension: Secondary | ICD-10-CM | POA: Insufficient documentation

## 2013-12-19 DIAGNOSIS — R1030 Lower abdominal pain, unspecified: Secondary | ICD-10-CM

## 2013-12-19 DIAGNOSIS — G4733 Obstructive sleep apnea (adult) (pediatric): Secondary | ICD-10-CM | POA: Insufficient documentation

## 2013-12-19 DIAGNOSIS — R0789 Other chest pain: Principal | ICD-10-CM | POA: Insufficient documentation

## 2013-12-19 DIAGNOSIS — Z79899 Other long term (current) drug therapy: Secondary | ICD-10-CM | POA: Insufficient documentation

## 2013-12-19 DIAGNOSIS — R072 Precordial pain: Secondary | ICD-10-CM

## 2013-12-19 DIAGNOSIS — K219 Gastro-esophageal reflux disease without esophagitis: Secondary | ICD-10-CM | POA: Insufficient documentation

## 2013-12-19 DIAGNOSIS — R0602 Shortness of breath: Secondary | ICD-10-CM | POA: Insufficient documentation

## 2013-12-19 DIAGNOSIS — E785 Hyperlipidemia, unspecified: Secondary | ICD-10-CM | POA: Insufficient documentation

## 2013-12-19 DIAGNOSIS — Z7901 Long term (current) use of anticoagulants: Secondary | ICD-10-CM | POA: Insufficient documentation

## 2013-12-19 DIAGNOSIS — F5000 Anorexia nervosa, unspecified: Secondary | ICD-10-CM

## 2013-12-19 DIAGNOSIS — K589 Irritable bowel syndrome without diarrhea: Secondary | ICD-10-CM | POA: Insufficient documentation

## 2013-12-19 DIAGNOSIS — Z794 Long term (current) use of insulin: Secondary | ICD-10-CM | POA: Insufficient documentation

## 2013-12-19 DIAGNOSIS — I4891 Unspecified atrial fibrillation: Secondary | ICD-10-CM | POA: Insufficient documentation

## 2013-12-19 DIAGNOSIS — R251 Tremor, unspecified: Secondary | ICD-10-CM

## 2013-12-19 DIAGNOSIS — D509 Iron deficiency anemia, unspecified: Secondary | ICD-10-CM

## 2013-12-19 DIAGNOSIS — E039 Hypothyroidism, unspecified: Secondary | ICD-10-CM | POA: Insufficient documentation

## 2013-12-19 LAB — CBC WITH DIFFERENTIAL/PLATELET
BASOS PCT: 0 % (ref 0–1)
Basophils Absolute: 0 10*3/uL (ref 0.0–0.1)
EOS ABS: 0.1 10*3/uL (ref 0.0–0.7)
EOS PCT: 1 % (ref 0–5)
HEMATOCRIT: 33.9 % — AB (ref 36.0–46.0)
HEMOGLOBIN: 10.7 g/dL — AB (ref 12.0–15.0)
Lymphocytes Relative: 40 % (ref 12–46)
Lymphs Abs: 1.9 10*3/uL (ref 0.7–4.0)
MCH: 28.3 pg (ref 26.0–34.0)
MCHC: 31.6 g/dL (ref 30.0–36.0)
MCV: 89.7 fL (ref 78.0–100.0)
MONO ABS: 0.7 10*3/uL (ref 0.1–1.0)
MONOS PCT: 15 % — AB (ref 3–12)
NEUTROS ABS: 2.1 10*3/uL (ref 1.7–7.7)
Neutrophils Relative %: 44 % (ref 43–77)
Platelets: 207 10*3/uL (ref 150–400)
RBC: 3.78 MIL/uL — ABNORMAL LOW (ref 3.87–5.11)
RDW: 14.6 % (ref 11.5–15.5)
WBC: 4.8 10*3/uL (ref 4.0–10.5)

## 2013-12-19 LAB — TROPONIN I

## 2013-12-19 LAB — PRO B NATRIURETIC PEPTIDE: Pro B Natriuretic peptide (BNP): 2357 pg/mL — ABNORMAL HIGH (ref 0–125)

## 2013-12-19 LAB — COMPREHENSIVE METABOLIC PANEL
ALT: 8 U/L (ref 0–35)
AST: 11 U/L (ref 0–37)
Albumin: 3.2 g/dL — ABNORMAL LOW (ref 3.5–5.2)
Alkaline Phosphatase: 65 U/L (ref 39–117)
BUN: 13 mg/dL (ref 6–23)
CALCIUM: 8.7 mg/dL (ref 8.4–10.5)
CO2: 23 mEq/L (ref 19–32)
Chloride: 107 mEq/L (ref 96–112)
Creatinine, Ser: 0.69 mg/dL (ref 0.50–1.10)
GFR calc Af Amer: >90 mL/min (ref >90)
GFR calc non Af Amer: >90 mL/min (ref >90)
Glucose, Bld: 173 mg/dL — ABNORMAL HIGH (ref 70–99)
Potassium: 4.2 mEq/L (ref 3.7–5.3)
SODIUM: 143 meq/L (ref 137–147)
TOTAL PROTEIN: 6.3 g/dL (ref 6.0–8.3)
Total Bilirubin: 0.3 mg/dL (ref 0.3–1.2)

## 2013-12-19 MED ORDER — MORPHINE SULFATE 2 MG/ML IJ SOLN
1.0000 mg | Freq: Once | INTRAMUSCULAR | Status: AC
  Administered 2013-12-19: 1 mg via INTRAVENOUS
  Filled 2013-12-19: qty 1

## 2013-12-19 MED ORDER — LORAZEPAM 1 MG PO TABS
1.0000 mg | ORAL_TABLET | Freq: Every day | ORAL | Status: DC | PRN
  Administered 2013-12-20: 1 mg via ORAL
  Filled 2013-12-19: qty 1

## 2013-12-19 MED ORDER — METFORMIN HCL 500 MG PO TABS
1000.0000 mg | ORAL_TABLET | Freq: Two times a day (BID) | ORAL | Status: DC
  Administered 2013-12-20: 1000 mg via ORAL
  Filled 2013-12-19: qty 2

## 2013-12-19 MED ORDER — LEVOTHYROXINE SODIUM 25 MCG PO TABS
125.0000 ug | ORAL_TABLET | Freq: Every day | ORAL | Status: DC
  Administered 2013-12-20 – 2013-12-21 (×2): 125 ug via ORAL
  Filled 2013-12-19 (×6): qty 1

## 2013-12-19 MED ORDER — ATORVASTATIN CALCIUM 10 MG PO TABS
20.0000 mg | ORAL_TABLET | Freq: Every day | ORAL | Status: DC
  Administered 2013-12-20: 20 mg via ORAL
  Filled 2013-12-19 (×2): qty 2

## 2013-12-19 MED ORDER — ACETAMINOPHEN ER 650 MG PO TBCR: 1300.0000 mg | EXTENDED_RELEASE_TABLET | Freq: Three times a day (TID) | ORAL | Status: DC | PRN

## 2013-12-19 MED ORDER — LEVETIRACETAM 250 MG PO TABS
250.0000 mg | ORAL_TABLET | Freq: Two times a day (BID) | ORAL | Status: DC
  Administered 2013-12-19 – 2013-12-21 (×4): 250 mg via ORAL
  Filled 2013-12-19 (×9): qty 1

## 2013-12-19 MED ORDER — INSULIN GLARGINE 100 UNIT/ML ~~LOC~~ SOLN
22.0000 [IU] | Freq: Every day | SUBCUTANEOUS | Status: DC
  Administered 2013-12-20 (×2): 22 [IU] via SUBCUTANEOUS
  Filled 2013-12-19 (×4): qty 0.22

## 2013-12-19 MED ORDER — APIXABAN 5 MG PO TABS
5.0000 mg | ORAL_TABLET | Freq: Two times a day (BID) | ORAL | Status: DC
Start: 2013-12-19 — End: 2013-12-21
  Administered 2013-12-19 – 2013-12-21 (×4): 5 mg via ORAL
  Filled 2013-12-19 (×8): qty 1

## 2013-12-19 MED ORDER — INSULIN ASPART 100 UNIT/ML ~~LOC~~ SOLN: 0.0000 [IU] | Freq: Every day | SUBCUTANEOUS | Status: DC

## 2013-12-19 MED ORDER — DILTIAZEM HCL ER COATED BEADS 240 MG PO CP24
240.0000 mg | ORAL_CAPSULE | Freq: Every evening | ORAL | Status: DC
  Administered 2013-12-20: 240 mg via ORAL
  Filled 2013-12-19 (×2): qty 1

## 2013-12-19 MED ORDER — METOCLOPRAMIDE HCL 10 MG PO TABS
10.0000 mg | ORAL_TABLET | Freq: Four times a day (QID) | ORAL | Status: DC
  Administered 2013-12-19 – 2013-12-21 (×6): 10 mg via ORAL
  Filled 2013-12-19 (×6): qty 1

## 2013-12-19 MED ORDER — METOPROLOL TARTRATE 50 MG PO TABS
100.0000 mg | ORAL_TABLET | Freq: Two times a day (BID) | ORAL | Status: DC
  Administered 2013-12-20 – 2013-12-21 (×3): 100 mg via ORAL
  Filled 2013-12-19 (×4): qty 2

## 2013-12-19 MED ORDER — ONDANSETRON HCL 4 MG PO TABS: 8.0000 mg | ORAL_TABLET | Freq: Every day | ORAL | Status: DC | PRN

## 2013-12-19 MED ORDER — SODIUM CHLORIDE 0.9 % IV SOLN
INTRAVENOUS | Status: DC
  Administered 2013-12-19 (×2): via INTRAVENOUS

## 2013-12-19 MED ORDER — ASPIRIN 81 MG PO CHEW
324.0000 mg | CHEWABLE_TABLET | Freq: Once | ORAL | Status: AC
  Administered 2013-12-19: 324 mg via ORAL
  Filled 2013-12-19: qty 4

## 2013-12-19 MED ORDER — GLIPIZIDE 5 MG PO TABS
10.0000 mg | ORAL_TABLET | Freq: Two times a day (BID) | ORAL | Status: DC
  Administered 2013-12-20 – 2013-12-21 (×3): 10 mg via ORAL
  Filled 2013-12-19 (×6): qty 2

## 2013-12-19 MED ORDER — ACETAMINOPHEN 325 MG PO TABS
650.0000 mg | ORAL_TABLET | Freq: Four times a day (QID) | ORAL | Status: DC | PRN
  Administered 2013-12-21: 650 mg via ORAL
  Filled 2013-12-19: qty 2

## 2013-12-19 MED ORDER — NITROGLYCERIN 0.4 MG SL SUBL: 0.4000 mg | SUBLINGUAL_TABLET | SUBLINGUAL | Status: DC | PRN

## 2013-12-19 MED ORDER — INSULIN ASPART 100 UNIT/ML ~~LOC~~ SOLN
0.0000 [IU] | Freq: Three times a day (TID) | SUBCUTANEOUS | Status: DC
  Administered 2013-12-20: 4 [IU] via SUBCUTANEOUS

## 2013-12-19 MED ORDER — LOPERAMIDE HCL 2 MG PO CAPS: 4.0000 mg | ORAL_CAPSULE | ORAL | Status: DC | PRN

## 2013-12-19 MED ORDER — FUROSEMIDE 40 MG PO TABS
40.0000 mg | ORAL_TABLET | Freq: Every day | ORAL | Status: DC
  Filled 2013-12-19 (×2): qty 1

## 2013-12-19 MED ORDER — PANCRELIPASE (LIP-PROT-AMYL) 12000-38000 UNITS PO CPEP
2.0000 | ORAL_CAPSULE | Freq: Three times a day (TID) | ORAL | Status: DC
  Administered 2013-12-20 – 2013-12-21 (×4): 2 via ORAL
  Filled 2013-12-19 (×9): qty 2

## 2013-12-19 MED ORDER — HYDROCODONE-ACETAMINOPHEN 10-325 MG PO TABS
1.0000 | ORAL_TABLET | Freq: Two times a day (BID) | ORAL | Status: DC | PRN
  Administered 2013-12-20 – 2013-12-21 (×2): 1 via ORAL
  Filled 2013-12-19 (×2): qty 1

## 2013-12-19 MED ORDER — NITROGLYCERIN 0.4 MG SL SUBL
0.4000 mg | SUBLINGUAL_TABLET | SUBLINGUAL | Status: DC | PRN
  Administered 2013-12-19: 0.4 mg via SUBLINGUAL
  Filled 2013-12-19: qty 1

## 2013-12-19 MED ORDER — PANCRELIPASE (LIP-PROT-AMYL) 12000-38000 UNITS PO CPEP
1.0000 | ORAL_CAPSULE | ORAL | Status: DC | PRN
  Filled 2013-12-19: qty 1

## 2013-12-19 MED ORDER — METOPROLOL TARTRATE 50 MG PO TABS
100.0000 mg | ORAL_TABLET | Freq: Two times a day (BID) | ORAL | Status: DC
  Administered 2013-12-19: 100 mg via ORAL

## 2013-12-19 MED ORDER — SIMVASTATIN 40 MG PO TABS: 40.0000 mg | ORAL_TABLET | Freq: Every day | ORAL | Status: DC

## 2013-12-19 MED ORDER — BACLOFEN 10 MG PO TABS
10.0000 mg | ORAL_TABLET | Freq: Two times a day (BID) | ORAL | Status: DC
  Administered 2013-12-19 – 2013-12-21 (×4): 10 mg via ORAL
  Filled 2013-12-19 (×8): qty 1

## 2013-12-19 MED ORDER — SODIUM CHLORIDE 0.9 % IV BOLUS (SEPSIS)
250.0000 mL | Freq: Once | INTRAVENOUS | Status: AC
  Administered 2013-12-19: 250 mL via INTRAVENOUS

## 2013-12-19 MED ORDER — PANTOPRAZOLE SODIUM 40 MG PO TBEC
40.0000 mg | DELAYED_RELEASE_TABLET | Freq: Every day | ORAL | Status: DC
  Administered 2013-12-20 – 2013-12-21 (×2): 40 mg via ORAL
  Filled 2013-12-19 (×2): qty 1

## 2013-12-19 MED ORDER — POTASSIUM CHLORIDE CRYS ER 20 MEQ PO TBCR
20.0000 meq | EXTENDED_RELEASE_TABLET | Freq: Three times a day (TID) | ORAL | Status: DC
  Administered 2013-12-19 – 2013-12-21 (×5): 20 meq via ORAL
  Filled 2013-12-19 (×5): qty 1

## 2013-12-19 NOTE — ED Notes (Signed)
Patient reports fluttering sensation in chest with left-sided chest tightness and tenderness. Reports started at approximately 1830 while sitting in recliner. States history of A-fib.

## 2013-12-19 NOTE — ED Provider Notes (Addendum)
CSN: [id number]     Arrival date & time 12/19/13  1944 History   First MD Initiated Contact with Patient 12/19/13 1945     Chief Complaint  Patient presents with  . Palpitations  . Chest Pain     (Consider location/radiation/quality/duration/timing/severity/associated sxs/prior Treatment) The history is provided by the patient.   63 year old female was fine until 6:30 this evening was sitting in her recliner developed a feeling of heart fluttering and palpitations. They did check her blood pressure and heart rate at home never was above 100. This was associated with left-sided chest tightness pressure that has persisted. 7/10. Patient has nitroglycerin at home but did not take any. Patient is followed by Dr. branch from cardiology. Patient has chronic atrial fibrillation and is on Ellaquis. Patient seen by cardiology about once a year. Patient has a known history of CHF but thought to be related to the atrial fib. No distinct coronary artery disease. Patient has not had a recent catheterization. The chest pain associated with some mild shortness of breath no nausea no vomiting.  Past Medical History  Diagnosis Date  . Chest pain     Negative cardiac catheterization in 2002; negative stress nuclear study in 2008  . Congestive heart failure     Normal LV systolic function  . PAF (paroxysmal atrial fibrillation) 2009  . Chronic anticoagulation   . Diabetes mellitus, type 2     Insulin therapy; exacerbated by prednisone  . Syncope     Admitted 05/2009; magnetic resonance imagin/ MRA - negative; etiology thought to be orthostasis secondary to drugs and dehydration  . GERD (gastroesophageal reflux disease)   . IBS (irritable bowel syndrome)   . Hiatal hernia   . Gastroparesis     99% retention 05/2008 on GES  . Hyperlipidemia   . Hypertension   . Hypothyroid   . Chronic LBP     Surgical intervention in 1996  . Obesity   . OSA on CPAP   . Anemia     H/H of 10/30 with a normal MCV in  12/09  . Barrett's esophagus     Diagnosed 1995. Last EGD 2008.   . Seizures    Past Surgical History  Procedure Laterality Date  . Cardiovascular stress test  2008    Stress nuclear study  . Back surgery  1996  . Laparoscopic cholecystectomy  1990s  . Total abdominal hysterectomy  1999  . Laminectomy  1995    L4-L5  . Carpal tunnel release  1994  . Esophagogastroduodenoscopy  2008    Barrett's without dysplasia. esphagus dilated. antral erosions, h.pylori serologies negative.  . Colonoscopy  11/2011    Dr. Darrick Penna: Internal hemorrhoids, mild diverticulosis. Random colon biopsies negative.  . Givens capsule study  12/07/2011    Proximal small bowel, rare AVM. Distal small bowel, multiple ulcers noted  . Cardiac catheterization  2002  . Esophagogastroduodenoscopy  11/2011    Dr. Darrick Penna: Barrett's esophagus, mild gastritis, diverticulum in the second portion of the duodenum repeat EGD 3 years. Small bowel biopsies negative. Gastric biopsy show reactive gastropathy but no H. pylori. Esophageal biopsies consistent with GERD.  [employer] capsule study N/A 09/27/2013    Procedure: GIVENS CAPSULE STUDY;  Surgeon: West Bali, MD;  Location: [facility];  Service: Endoscopy;  Laterality: N/A;  7:30  . Givens capsule study N/A 10/10/2013    Procedure: GIVENS CAPSULE STUDY;  Surgeon: West Bali, MD;  Location: [facility];  Service: Endoscopy;  Laterality: N/A;  7:30  . Colonoscopy N/A 11/08/2013    Procedure: COLONOSCOPY WITH ILEOSCOPY;  Surgeon: West BaliSandi L Fields, MD;  Location: [facility];  Service: Endoscopy;  Laterality: N/A;  9:30 AM  . Esophageal biopsy N/A 11/08/2013    Procedure: BIOPSY  / Tissue sampling / ulcers present in small intestine;  Surgeon: West BaliSandi L Fields, MD;  Location: [facility];  Service: Endoscopy;  Laterality: N/A;   Family History  Problem Relation Age of Onset  . Hypertension Mother   . Alzheimer's disease Mother   . Stroke Mother   . Heart attack Mother   .  Heart disease Neg Hx   . Hypertension Other   . Colon cancer Neg Hx    History  Substance Use Topics  . Smoking status: Former Smoker -- 15 years    Quit date: 07/01/1983  . Smokeless tobacco: Never Used     Comment: Remote  . Alcohol Use: No     Comment: last etoh one year ago, never frequent   OB History   Grav Para Term Preterm Abortions TAB SAB Ect Mult Living   2 2 2             Review of Systems  Constitutional: Negative for fever.  HENT: Negative for congestion.   Eyes: Negative for visual disturbance.  Respiratory: Positive for shortness of breath.   Cardiovascular: Positive for chest pain and palpitations.  Gastrointestinal: Negative for nausea, vomiting and abdominal pain.  Genitourinary: Negative for dysuria.  Musculoskeletal: Negative for back pain.  Skin: Negative for rash.  Neurological: Negative for headaches.  Hematological: Does not bruise/bleed easily.  Psychiatric/Behavioral: Negative for confusion.      Allergies  Ibuprofen; Celexa; and Codeine  Home Medications   Prior to Admission medications   Medication Sig Start Date End Date Taking? Authorizing Provider  acetaminophen (TYLENOL ARTHRITIS PAIN) 650 MG CR tablet Take 1,300 mg by mouth every 8 (eight) hours as needed for pain.   Yes Historical Provider, MD  apixaban (ELIQUIS) 5 MG TABS tablet Take 1 tablet (5 mg total) by mouth 2 (two) times daily. 12/16/13  Yes Antoine PocheJonathan F Branch, MD  baclofen (LIORESAL) 10 MG tablet Take 10 mg by mouth 2 (two) times daily. 11/15/13  Yes Historical Provider, MD  diltiazem (CARDIZEM CD) 240 MG 24 hr capsule Take 240 mg by mouth every evening. 07/04/13  Yes Milana ObeyStephen D Knowlton, MD  furosemide (LASIX) 40 MG tablet Take 40 mg by mouth daily. 12/03/13  Yes Historical Provider, MD  glipiZIDE (GLUCOTROL) 10 MG tablet Take 10 mg by mouth 2 (two) times daily.    Yes Historical Provider, MD  HYDROcodone-acetaminophen (NORCO) 10-325 MG per tablet Take 1 tablet by mouth 2 (two) times  daily as needed for moderate pain or severe pain.  10/23/13  Yes Historical Provider, MD  Insulin Glargine (LANTUS SOLOSTAR) 100 UNIT/ML Solostar Pen Inject 22 Units into the skin at bedtime.    Yes Historical Provider, MD  levETIRAcetam (KEPPRA) 250 MG tablet Take 1 tablet (250 mg total) by mouth 2 (two) times daily. 11/10/12  Yes Milana ObeyStephen D Knowlton, MD  levothyroxine (SYNTHROID, LEVOTHROID) 125 MCG tablet Take 125 mcg by mouth daily.   Yes Historical Provider, MD  lipase/protease/amylase (CREON-12/PANCREASE) 12000 UNITS CPEP capsule Take 1-2 capsules by mouth 3 (three) times daily with meals. 2 with meals and 1 with snacks   Yes Historical Provider, MD  LORazepam (ATIVAN) 1 MG tablet Take 1-2 mg by mouth daily as needed  for anxiety or sleep. For anxiety   Yes Historical Provider, MD  metFORMIN (GLUCOPHAGE) 500 MG tablet Take 1,000 mg by mouth 2 (two) times daily with a meal.    Yes Historical Provider, MD  metoCLOPramide (REGLAN) 10 MG tablet Take 10 mg by mouth 4 (four) times daily.  09/20/12  Yes Historical Provider, MD  metoprolol (LOPRESSOR) 100 MG tablet Take 100 mg by mouth 2 (two) times daily.   Yes Historical Provider, MD  nitroGLYCERIN (NITROSTAT) 0.4 MG SL tablet Place 0.4 mg under the tongue every 5 (five) minutes as needed for chest pain. For chest pain    Yes Historical Provider, MD  omeprazole (PRILOSEC) 20 MG capsule Take 20 mg by mouth 2 (two) times daily.     Yes Historical Provider, MD  ondansetron (ZOFRAN) 8 MG tablet Take 8 mg by mouth daily as needed. nausea 11/01/11  Yes Historical Provider, MD  potassium chloride SA (K-DUR,KLOR-CON) 20 MEQ tablet Take 20 mEq by mouth 3 (three) times daily.    Yes Historical Provider, MD  simvastatin (ZOCOR) 40 MG tablet Take 40 mg by mouth at bedtime.    Yes Historical Provider, MD  loperamide (IMODIUM) 2 MG capsule Take 4 mg by mouth as needed for diarrhea or loose stools.     Historical Provider, MD   BP 131/77  Pulse 75  Temp(Src) 98 F  (36.7 C) (Oral)  Resp 20  Ht 5\' 8"  (1.727 m)  Wt 196 lb (88.905 kg)  BMI 29.81 kg/m2  SpO2 96% Physical Exam  Nursing note and vitals reviewed. Constitutional: She is oriented to person, place, and time. She appears well-developed and well-nourished. No distress.  HENT:  Head: Normocephalic and atraumatic.  Mouth/Throat: Oropharynx is clear and moist.  Eyes: Conjunctivae and EOM are normal. Pupils are equal, round, and reactive to light.  Neck: Normal range of motion.  Cardiovascular: Normal rate and regular rhythm.   Pulmonary/Chest: Effort normal and breath sounds normal. No respiratory distress.  Abdominal: Soft. Bowel sounds are normal. She exhibits no distension.  Musculoskeletal: Normal range of motion. She exhibits no edema.  Neurological: She is alert and oriented to person, place, and time. No cranial nerve deficit.  Skin: Skin is warm. No rash noted.    ED Course  Procedures (including critical care time) Labs Review Labs Reviewed  PRO B NATRIURETIC PEPTIDE - Abnormal; Notable for the following:    Pro B Natriuretic peptide (BNP) 2357.0 (*)    All other components within normal limits  COMPREHENSIVE METABOLIC PANEL - Abnormal; Notable for the following:    Glucose, Bld 173 (*)    Albumin 3.2 (*)    All other components within normal limits  CBC WITH DIFFERENTIAL - Abnormal; Notable for the following:    RBC 3.78 (*)    Hemoglobin 10.7 (*)    HCT 33.9 (*)    Monocytes Relative 15 (*)    All other components within normal limits  TROPONIN I  URINALYSIS, ROUTINE W REFLEX MICROSCOPIC   Results for orders placed during the hospital encounter of 12/19/13  PRO B NATRIURETIC PEPTIDE      Result Value Ref Range   Pro B Natriuretic peptide (BNP) 2357.0 (*) 0 - 125 pg/mL  TROPONIN I      Result Value Ref Range   Troponin I <0.30  <0.30 ng/mL  COMPREHENSIVE METABOLIC PANEL      Result Value Ref Range   Sodium 143  137 - 147 mEq/L   Potassium 4.2  3.7 - 5.3 mEq/L    Chloride 107  96 - 112 mEq/L   CO2 23  19 - 32 mEq/L   Glucose, Bld 173 (*) 70 - 99 mg/dL   BUN 13  6 - 23 mg/dL   Creatinine, Ser 1.61  0.50 - 1.10 mg/dL   Calcium 8.7  8.4 - 09.6 mg/dL   Total Protein 6.3  6.0 - 8.3 g/dL   Albumin 3.2 (*) 3.5 - 5.2 g/dL   AST 11  0 - 37 U/L   ALT 8  0 - 35 U/L   Alkaline Phosphatase 65  39 - 117 U/L   Total Bilirubin 0.3  0.3 - 1.2 mg/dL   GFR calc non Af Amer >90  >90 mL/min   GFR calc Af Amer >90  >90 mL/min  CBC WITH DIFFERENTIAL      Result Value Ref Range   WBC 4.8  4.0 - 10.5 K/uL   RBC 3.78 (*) 3.87 - 5.11 MIL/uL   Hemoglobin 10.7 (*) 12.0 - 15.0 g/dL   HCT 04.5 (*) 40.9 - 81.1 %   MCV 89.7  78.0 - 100.0 fL   MCH 28.3  26.0 - 34.0 pg   MCHC 31.6  30.0 - 36.0 g/dL   RDW 91.4  78.2 - 95.6 %   Platelets 207  150 - 400 K/uL   Neutrophils Relative % 44  43 - 77 %   Neutro Abs 2.1  1.7 - 7.7 K/uL   Lymphocytes Relative 40  12 - 46 %   Lymphs Abs 1.9  0.7 - 4.0 K/uL   Monocytes Relative 15 (*) 3 - 12 %   Monocytes Absolute 0.7  0.1 - 1.0 K/uL   Eosinophils Relative 1  0 - 5 %   Eosinophils Absolute 0.1  0.0 - 0.7 K/uL   Basophils Relative 0  0 - 1 %   Basophils Absolute 0.0  0.0 - 0.1 K/uL     Imaging Review Dg Chest Port 1 View  12/19/2013   CLINICAL DATA:  Left-sided chest pain and shortness of breath since 6:30 p.m. History of CHF, hypertension, diabetes.  EXAM: PORTABLE CHEST - 1 VIEW  COMPARISON:  12/03/2013  FINDINGS: The heart is enlarged. There is left lung base density which partially obscures the hemidiaphragm. Findings are consistent with atelectasis or mild infiltrate. Aeration appears improved since exams earlier in the year. There is mild chronic bronchitic change. No overt edema.  IMPRESSION: 1. Cardiomegaly, stable. 2. Persistent but improved density in the left lung base.   Electronically Signed   By: Rosalie Gums M.D.   On: 12/19/2013 20:39     EKG Interpretation   Date/Time:  Thursday December 19 2013 19:54:56  EDT Ventricular Rate:  72 PR Interval:    QRS Duration: 108 QT Interval:  417 QTC Calculation: 456 R Axis:   -31 Text Interpretation:  Atrial fibrillation Left axis deviation Abnormal  R-wave progression, early transition Nonspecific T abnormalities, lateral  leads No significant change since last tracing Confirmed by ZACKOWSKI  MD,  SCOTT [phone]) on 12/19/2013 8:07:14 PM      MDM   Final diagnoses:  Chest pain, unspecified chest pain type  Chronic atrial fibrillation    The patient with chest pressure but was initially concerned about more of a fluttering sensation in her heart chest pressures left-sided started at 6:30 in the EEG being. Also described as chest tightness. Patient has nitroglycerin at home but did not take any of that.  Patient given aspirin and nitroglycerin here. Onset of the discomfort occurred while she was sitting in her recliner. Patient followed by cardiology Dr. Wyline Mood. Patient with known history of atrial fib. Is on pelvic list. Patient's had a history of congestive heart failure in the past. No known coronary artery disease stents or recent catheterization.  Patient's workup for the chest pain now without any significant findings EKG showed atrial fib rate trolled no acute ST changes. Chest x-ray negative for CHF pulmonary edema pneumonia. Does show cardiomegaly is stable does show a persistent density in the left lung base. No significant anemia troponin was negative. Patient treated with aspirin and nitroglycerin. Chest pain improved. But not resolved. Patient treated with aspirin and nitroglycerin as stated.   chest pain not resolved. Discussed with hospitalist they will admit to telemetry. Cardiology may need to weigh and on the degree of her chest pain. Patient was admitted June 2 persistent similar scenario. Ruled out. Patient will be tried with a little bit of morphine to see if we get chest pain the clear completely. Patient atrial fib present rate controlled  no rapid heart rate here.   Vanetta Mulders, MD 12/19/13 2121  Vanetta Mulders, MD 12/19/13 2128

## 2013-12-19 NOTE — H&P (Signed)
[facility] History and Physical  [name]:[id number] DOB: Mar 01, 1951 DOA: 12/19/2013  Referring physician: ER PCP: Tara Obey, MD   Chief Complaint: Chest pain  HPI: [name] is a 63 y.o. female  This is a 63 year old lady, obese, diabetic with chronic atrial fibrillation who presents with chest pain. She had a similar presentation to the hospital approximately 2 weeks ago. When she arrived to the emergency room today, she was given Nitrolingual and this is helped the pain to some degree. Currently she's not completely pain-free but has significantly improved. She is now being admitted for further investigation.   Review of Systems:  Constitutional:  No weight loss, night sweats, Fevers, chills, fatigue.  HEENT:  No headaches, Difficulty swallowing,Tooth/dental problems,Sore throat,  No sneezing, itching, ear ache, nasal congestion, post nasal drip,   GI:  No heartburn, indigestion, abdominal pain, nausea, vomiting, diarrhea, change in bowel habits, loss of appetite  Resp:  No shortness of breath with exertion or at rest. No excess mucus, no productive cough, No non-productive cough, No coughing up of blood.No change in color of mucus.No wheezing.No chest wall deformity  Skin:  no rash or lesions.  GU:  no dysuria, change in color of urine, no urgency or frequency. No flank pain.  Musculoskeletal:  No joint pain or swelling. No decreased range of motion. No back pain.  Psych:  No change in mood or affect. No depression or anxiety. No memory loss.   Past Medical History  Diagnosis Date  . Chest pain     Negative cardiac catheterization in 2002; negative stress nuclear study in 2008  . Congestive heart failure     Normal LV systolic function  . PAF (paroxysmal atrial fibrillation) 2009  . Chronic anticoagulation   . Diabetes mellitus, type 2     Insulin therapy; exacerbated by prednisone  . Syncope     Admitted 05/2009; magnetic resonance  imagin/ MRA - negative; etiology thought to be orthostasis secondary to drugs and dehydration  . GERD (gastroesophageal reflux disease)   . IBS (irritable bowel syndrome)   . Hiatal hernia   . Gastroparesis     99% retention 05/2008 on GES  . Hyperlipidemia   . Hypertension   . Hypothyroid   . Chronic LBP     Surgical intervention in 1996  . Obesity   . OSA on CPAP   . Anemia     H/H of 10/30 with a normal MCV in 12/09  . Barrett's esophagus     Diagnosed 1995. Last EGD 2008.   . Seizures    Past Surgical History  Procedure Laterality Date  . Cardiovascular stress test  2008    Stress nuclear study  . Back surgery  1996  . Laparoscopic cholecystectomy  1990s  . Total abdominal hysterectomy  1999  . Laminectomy  1995    L4-L5  . Carpal tunnel release  1994  . Esophagogastroduodenoscopy  2008    Barrett's without dysplasia. esphagus dilated. antral erosions, h.pylori serologies negative.  . Colonoscopy  11/2011    Dr. Darrick [name]: Internal hemorrhoids, mild diverticulosis. Random colon biopsies negative.  . Givens capsule study  12/07/2011    Proximal small bowel, rare AVM. Distal small bowel, multiple ulcers noted  . Cardiac catheterization  2002  . Esophagogastroduodenoscopy  11/2011    Dr. Darrick [name]: Barrett's esophagus, mild gastritis, diverticulum in the second portion of the duodenum repeat EGD 3 years. Small bowel biopsies negative. Gastric biopsy show reactive gastropathy but no  H. pylori. Esophageal biopsies consistent with GERD.  [name] capsule study N/A 09/27/2013    Procedure: GIVENS CAPSULE STUDY;  Surgeon: Tara Bali, MD;  Location: [facility];  Service: Endoscopy;  Laterality: N/A;  7:30  . Givens capsule study N/A 10/10/2013    Procedure: GIVENS CAPSULE STUDY;  Surgeon: Tara Bali, MD;  Location: [facility];  Service: Endoscopy;  Laterality: N/A;  7:30  . Colonoscopy N/A 11/08/2013    Procedure: COLONOSCOPY WITH ILEOSCOPY;  Surgeon: Tara Bali, MD;   Location: [facility];  Service: Endoscopy;  Laterality: N/A;  9:30 AM  . Esophageal biopsy N/A 11/08/2013    Procedure: BIOPSY  / Tissue sampling / ulcers present in small intestine;  Surgeon: Tara Bali, MD;  Location: [facility];  Service: Endoscopy;  Laterality: N/A;   Social History:  reports that she quit smoking about 30 years ago. She has never used smokeless tobacco. She reports that she does not drink alcohol or use illicit drugs.  Allergies  Allergen Reactions  . Ibuprofen     Upsets her stomach and causes abdominal pain  . Celexa [Citalopram Hydrobromide]   . Codeine Nausea And Vomiting    HALLUCINATIONS    Family History  Problem Relation Age of Onset  . Hypertension Mother   . Alzheimer's disease Mother   . Stroke Mother   . Heart attack Mother   . Heart disease Neg Hx   . Hypertension Other   . Colon cancer Neg Hx      Prior to Admission medications   Medication Sig Start Date End Date Taking? Authorizing Provider  acetaminophen (TYLENOL ARTHRITIS PAIN) 650 MG CR tablet Take 1,300 mg by mouth every 8 (eight) hours as needed for pain.   Yes Historical Provider, MD  apixaban (ELIQUIS) 5 MG TABS tablet Take 1 tablet (5 mg total) by mouth 2 (two) times daily. 12/16/13  Yes Tara Poche, MD  baclofen (LIORESAL) 10 MG tablet Take 10 mg by mouth 2 (two) times daily. 11/15/13  Yes Historical Provider, MD  diltiazem (CARDIZEM CD) 240 MG 24 hr capsule Take 240 mg by mouth every evening. 07/04/13  Yes Tara Obey, MD  furosemide (LASIX) 40 MG tablet Take 40 mg by mouth daily. 12/03/13  Yes Historical Provider, MD  glipiZIDE (GLUCOTROL) 10 MG tablet Take 10 mg by mouth 2 (two) times daily.    Yes Historical Provider, MD  HYDROcodone-acetaminophen (NORCO) 10-325 MG per tablet Take 1 tablet by mouth 2 (two) times daily as needed for moderate pain or severe pain.  10/23/13  Yes Historical Provider, MD  Insulin Glargine (LANTUS SOLOSTAR) 100 UNIT/ML Solostar Pen Inject  22 Units into the skin at bedtime.    Yes Historical Provider, MD  levETIRAcetam (KEPPRA) 250 MG tablet Take 1 tablet (250 mg total) by mouth 2 (two) times daily. 11/10/12  Yes Tara Obey, MD  levothyroxine (SYNTHROID, LEVOTHROID) 125 MCG tablet Take 125 mcg by mouth daily.   Yes Historical Provider, MD  lipase/protease/amylase (CREON-12/PANCREASE) 12000 UNITS CPEP capsule Take 1-2 capsules by mouth 3 (three) times daily with meals. 2 with meals and 1 with snacks   Yes Historical Provider, MD  LORazepam (ATIVAN) 1 MG tablet Take 1-2 mg by mouth daily as needed for anxiety or sleep. For anxiety   Yes Historical Provider, MD  metFORMIN (GLUCOPHAGE) 500 MG tablet Take 1,000 mg by mouth 2 (two) times daily with a meal.    Yes Historical  Provider, MD  metoCLOPramide (REGLAN) 10 MG tablet Take 10 mg by mouth 4 (four) times daily.  09/20/12  Yes Historical Provider, MD  metoprolol (LOPRESSOR) 100 MG tablet Take 100 mg by mouth 2 (two) times daily.   Yes Historical Provider, MD  nitroGLYCERIN (NITROSTAT) 0.4 MG SL tablet Place 0.4 mg under the tongue every 5 (five) minutes as needed for chest pain. For chest pain    Yes Historical Provider, MD  omeprazole (PRILOSEC) 20 MG capsule Take 20 mg by mouth 2 (two) times daily.     Yes Historical Provider, MD  ondansetron (ZOFRAN) 8 MG tablet Take 8 mg by mouth daily as needed. nausea 11/01/11  Yes Historical Provider, MD  potassium chloride SA (K-DUR,KLOR-CON) 20 MEQ tablet Take 20 mEq by mouth 3 (three) times daily.    Yes Historical Provider, MD  simvastatin (ZOCOR) 40 MG tablet Take 40 mg by mouth at bedtime.    Yes Historical Provider, MD  loperamide (IMODIUM) 2 MG capsule Take 4 mg by mouth as needed for diarrhea or loose stools.     Historical Provider, MD   Physical Exam: Filed Vitals:   12/19/13 2100  BP: 94/63  Pulse: 77  Temp:   Resp: 20    BP 94/63  Pulse 77  Temp(Src) 98 F (36.7 C) (Oral)  Resp 20  Ht 5\' 8"  (1.727 m)  Wt 88.905 kg  (196 lb)  BMI 29.81 kg/m2  SpO2 98%  General:  Appears calm and comfortable. Eyes: PERRL, normal lids, irises & conjunctiva ENT: grossly normal hearing, lips & tongue Neck: no LAD, masses or thyromegaly Cardiovascular: RRR, no m/r/g. No LE edema. Telemetry: SR, no arrhythmias  Respiratory: CTA bilaterally, no w/r/r. Normal respiratory effort. Abdomen: soft, ntnd Skin: no rash or induration seen on limited exam Musculoskeletal: grossly normal tone BUE/BLE. She is tender in the left anterior chest, reproducing her pain. Psychiatric: grossly normal mood and affect, speech fluent and appropriate Neurologic: grossly non-focal.          Labs on Admission:  Basic Metabolic Panel:  Recent Labs Lab 12/19/13 2009  NA 143  K 4.2  CL 107  CO2 23  GLUCOSE 173*  BUN 13  CREATININE 0.69  CALCIUM 8.7   Liver Function Tests:  Recent Labs Lab 12/19/13 2009  AST 11  ALT 8  ALKPHOS 65  BILITOT 0.3  PROT 6.3  ALBUMIN 3.2*   No results found for this basename: LIPASE, AMYLASE,  in the last 168 hours No results found for this basename: AMMONIA,  in the last 168 hours CBC:  Recent Labs Lab 12/19/13 2009  WBC 4.8  NEUTROABS 2.1  HGB 10.7*  HCT 33.9*  MCV 89.7  PLT 207   Cardiac Enzymes:  Recent Labs Lab 12/19/13 2009  TROPONINI <0.30    BNP (last 3 results)  Recent Labs  06/29/13 1718 10/13/13 0947 12/19/13 2009  PROBNP 5072.0* 3088.0* 2357.0*   CBG: No results found for this basename: GLUCAP,  in the last 168 hours  Radiological Exams on Admission: Dg Chest Port 1 View  12/19/2013   CLINICAL DATA:  Left-sided chest pain and shortness of breath since 6:30 p.m. History of CHF, hypertension, diabetes.  EXAM: PORTABLE CHEST - 1 VIEW  COMPARISON:  12/03/2013  FINDINGS: The heart is enlarged. There is left lung base density which partially obscures the hemidiaphragm. Findings are consistent with atelectasis or mild infiltrate. Aeration appears improved since exams  earlier in the year. There is mild chronic  bronchitic change. No overt edema.  IMPRESSION: 1. Cardiomegaly, stable. 2. Persistent but improved density in the left lung base.   Electronically Signed   By: Rosalie Gums M.D.   On: 12/19/2013 20:39    EKG: Independently reviewed. Atrial fibrillation. No acute ST-T wave changes.  Assessment/Plan   1.  Chest pain, appears to be musculoskeletal in nature. She has multiple risk factors for coronary artery disease. 2. Hypertension. 3. Type 2 diabetes mellitus. 4. Hyperlipidemia. 5. Chronic atrial fibrillation on chronic anticoagulation. 6. Obesity.  Plan: 1. Admit to medical floor. 2. Serial cardiac enzymes. 3. Cardiology consultation in the morning.  Further recommendations will depend on patient's hospital progress.   Code Status: Full code.  Family Communication: I discussed the plan with patient at the bedside.  Disposition Plan: Home when medically stable.   Time spent: 60 minutes.  Wilson Singer [facility] Pager [phone].  **Disclaimer: This note may have been dictated with voice recognition software. Similar sounding words can inadvertently be transcribed and this note may contain transcription errors which may not have been corrected upon publication of note.**

## 2013-12-20 ENCOUNTER — Observation Stay (HOSPITAL_COMMUNITY): Payer: Medicare Other

## 2013-12-20 DIAGNOSIS — I059 Rheumatic mitral valve disease, unspecified: Secondary | ICD-10-CM

## 2013-12-20 LAB — TROPONIN I: Troponin I: <0.3 ng/mL (ref <0.30)

## 2013-12-20 LAB — MRSA PCR SCREENING: MRSA by PCR: NEGATIVE

## 2013-12-20 LAB — URINALYSIS, ROUTINE W REFLEX MICROSCOPIC
BILIRUBIN URINE: NEGATIVE
Glucose, UA: NEGATIVE mg/dL
HGB URINE DIPSTICK: NEGATIVE
KETONES UR: NEGATIVE mg/dL
Leukocytes, UA: NEGATIVE
Nitrite: NEGATIVE
Specific Gravity, Urine: >1.03 — ABNORMAL HIGH (ref 1.005–1.030)
UROBILINOGEN UA: 0.2 mg/dL (ref 0.0–1.0)
pH: 5.5 (ref 5.0–8.0)

## 2013-12-20 LAB — COMPREHENSIVE METABOLIC PANEL
ALT: 8 U/L (ref 0–35)
AST: 12 U/L (ref 0–37)
Albumin: 3.1 g/dL — ABNORMAL LOW (ref 3.5–5.2)
Alkaline Phosphatase: 64 U/L (ref 39–117)
BUN: 11 mg/dL (ref 6–23)
CALCIUM: 8.8 mg/dL (ref 8.4–10.5)
CO2: 26 mEq/L (ref 19–32)
Chloride: 107 mEq/L (ref 96–112)
Creatinine, Ser: 0.57 mg/dL (ref 0.50–1.10)
GFR calc non Af Amer: >90 mL/min (ref >90)
Glucose, Bld: 122 mg/dL — ABNORMAL HIGH (ref 70–99)
Potassium: 3.9 mEq/L (ref 3.7–5.3)
Sodium: 144 mEq/L (ref 137–147)
TOTAL PROTEIN: 6.3 g/dL (ref 6.0–8.3)
Total Bilirubin: 0.3 mg/dL (ref 0.3–1.2)

## 2013-12-20 LAB — URINE MICROSCOPIC-ADD ON

## 2013-12-20 LAB — GLUCOSE, CAPILLARY
GLUCOSE-CAPILLARY: 154 mg/dL — AB (ref 70–99)
Glucose-Capillary: 121 mg/dL — ABNORMAL HIGH (ref 70–99)
Glucose-Capillary: 112 mg/dL — ABNORMAL HIGH (ref 70–99)
Glucose-Capillary: 87 mg/dL (ref 70–99)
Glucose-Capillary: 137 mg/dL — ABNORMAL HIGH (ref 70–99)

## 2013-12-20 LAB — CBC
HEMATOCRIT: 35.3 % — AB (ref 36.0–46.0)
Hemoglobin: 11 g/dL — ABNORMAL LOW (ref 12.0–15.0)
MCH: 28.4 pg (ref 26.0–34.0)
MCHC: 31.2 g/dL (ref 30.0–36.0)
MCV: 91.2 fL (ref 78.0–100.0)
Platelets: 221 10*3/uL (ref 150–400)
RBC: 3.87 MIL/uL (ref 3.87–5.11)
RDW: 14.8 % (ref 11.5–15.5)
WBC: 6.2 10*3/uL (ref 4.0–10.5)

## 2013-12-20 LAB — HEMOGLOBIN A1C
HEMOGLOBIN A1C: 7 % — AB (ref <5.7)
MEAN PLASMA GLUCOSE: 154 mg/dL — AB (ref <117)

## 2013-12-20 LAB — TSH: TSH: 0.085 u[IU]/mL — ABNORMAL LOW (ref 0.350–4.500)

## 2013-12-20 MED ORDER — KETOROLAC TROMETHAMINE 30 MG/ML IJ SOLN: 30.0000 mg | Freq: Three times a day (TID) | INTRAMUSCULAR | Status: DC | PRN

## 2013-12-20 MED ORDER — SIMETHICONE 80 MG PO CHEW
80.0000 mg | CHEWABLE_TABLET | Freq: Four times a day (QID) | ORAL | Status: DC | PRN
  Administered 2013-12-20 – 2013-12-21 (×2): 80 mg via ORAL
  Filled 2013-12-20 (×2): qty 1

## 2013-12-20 MED ORDER — POLYETHYLENE GLYCOL 3350 17 G PO PACK
17.0000 g | PACK | Freq: Every day | ORAL | Status: DC | PRN
  Administered 2013-12-20: 17 g via ORAL
  Filled 2013-12-20: qty 1

## 2013-12-20 MED ORDER — PNEUMOCOCCAL VAC POLYVALENT 25 MCG/0.5ML IJ INJ
0.5000 mL | INJECTION | INTRAMUSCULAR | Status: DC
  Filled 2013-12-20: qty 0.5

## 2013-12-20 MED ORDER — BIOTENE DRY MOUTH MT LIQD
15.0000 mL | Freq: Two times a day (BID) | OROMUCOSAL | Status: DC
  Administered 2013-12-20 – 2013-12-21 (×4): 15 mL via OROMUCOSAL

## 2013-12-20 MED ORDER — IOHEXOL 300 MG/ML  SOLN
80.0000 mL | Freq: Once | INTRAMUSCULAR | Status: AC | PRN
  Administered 2013-12-20: 80 mL via INTRAVENOUS

## 2013-12-20 MED ORDER — KETOROLAC TROMETHAMINE 30 MG/ML IJ SOLN
30.0000 mg | Freq: Three times a day (TID) | INTRAMUSCULAR | Status: DC | PRN
  Administered 2013-12-20: 30 mg via INTRAVENOUS
  Filled 2013-12-20: qty 1

## 2013-12-20 NOTE — Progress Notes (Signed)
Echo shows stable LV systolic function with no new wall motion abnormalities. Her chest pain is very atypical, no strong indication for inpatient stress testing at this time. Echo shows her pulmonary HTN has progressed since Dec 2014 from moderate (PASP approx 50 mmHg) up to current 85 mmHg (severely elevated). Constellation of clinical history and echo findings (prior echos with evidence of elevated LA pressures, severe biatrial enlargement) suggest the etiology of her pulm HTN is likely related to left sided heart failure, however she will need to be considered as an outpatient for further workup and even potentially a right heart cath. No further inpatient workup indicated at this time for her presentation of atypical reproducible chest pain, she has follow up with NP Lyman Bishop 12/27/13 scheduled.    Dina Rich MD

## 2013-12-20 NOTE — Progress Notes (Signed)
UR Completed Maryon Kemnitz Graves-Bigelow, RN,BSN [phone]  

## 2013-12-20 NOTE — Progress Notes (Signed)
Telemetry monitoring tech notified me of a pause in patient's heart rhythm lasting longer than two seconds. Patient was alert and vital signs stable upon assessment. Patient's typical rhythm is atrial fibrillation. Will continue to monitor.

## 2013-12-20 NOTE — Progress Notes (Signed)
  Echocardiogram 2D Echocardiogram has been performed.  RIGG, CYNTHIA 12/20/2013, 4:56 PM

## 2013-12-20 NOTE — Consult Note (Signed)
Consulting cardiologist: Dr Dina RichJonathan Branch MD  Clinical Summary [name] is a 63 y.o.female history of HTN, hyperlipidemia, afib on eliquis, mildly reduced to low normal LVEF by echo 06/2013, chronic diastolic dysfunction, Fe deficient anemia, Barretts esophagus admitted with chest pain.   Patient reports chest pain starting around 630pm yesterday while at rest. Pressure like pain in mid chest to under left breast, 7/10. Had some difficulty catching her breath but no other symptoms. Worst with movement, worst with pressing on area. Pain has last consistently since onset yesterday at 630pm though somewhat more mild currently.   EKG afib, probable LAFB (LAD, q wave I,aVL with poor R-wave progression), and no specific ischemic changes, troponins negative, pro-BNP 2357. CXR with stable cardiomegaly and chronic but improved LLL density. CT chest showed "severe cardiomegaly", regressed interstitial edema.    Allergies  Allergen Reactions  . Ibuprofen     Upsets her stomach and causes abdominal pain  . Celexa [Citalopram Hydrobromide]   . Codeine Nausea And Vomiting    HALLUCINATIONS    Medications Scheduled Medications: . antiseptic oral rinse  15 mL Mouth Rinse BID  . apixaban  5 mg Oral BID  . atorvastatin  20 mg Oral QHS  . baclofen  10 mg Oral BID  . diltiazem  240 mg Oral QPM  . furosemide  40 mg Oral Daily  . glipiZIDE  10 mg Oral BID AC  . insulin aspart  0-20 Units Subcutaneous TID WC  . insulin aspart  0-5 Units Subcutaneous QHS  . insulin glargine  22 Units Subcutaneous QHS  . levETIRAcetam  250 mg Oral BID  . levothyroxine  125 mcg Oral QAC breakfast  . lipase/protease/amylase  2 capsule Oral TID WC  . metFORMIN  1,000 mg Oral BID WC  . metoCLOPramide  10 mg Oral QID  . metoprolol  100 mg Oral BID  . pantoprazole  40 mg Oral Daily  . [START ON 12/21/2013] pneumococcal 23 valent vaccine  0.5 mL Intramuscular Tomorrow-1000  . potassium chloride SA  20 mEq Oral TID       Infusions: . sodium chloride 10 mL/hr at 12/19/13 2312     PRN Medications:  acetaminophen, HYDROcodone-acetaminophen, ketorolac, lipase/protease/amylase, loperamide, LORazepam, nitroGLYCERIN, ondansetron   Past Medical History  Diagnosis Date  . Chest pain     Negative cardiac catheterization in 2002; negative stress nuclear study in 2008  . Congestive heart failure     Normal LV systolic function  . PAF (paroxysmal atrial fibrillation) 2009  . Chronic anticoagulation   . Diabetes mellitus, type 2     Insulin therapy; exacerbated by prednisone  . Syncope     Admitted 05/2009; magnetic resonance imagin/ MRA - negative; etiology thought to be orthostasis secondary to drugs and dehydration  . GERD (gastroesophageal reflux disease)   . IBS (irritable bowel syndrome)   . Hiatal hernia   . Gastroparesis     99% retention 05/2008 on GES  . Hyperlipidemia   . Hypertension   . Hypothyroid   . Chronic LBP     Surgical intervention in 1996  . Obesity   . OSA on CPAP   . Anemia     H/H of 10/30 with a normal MCV in 12/09  . Barrett's esophagus     Diagnosed 1995. Last EGD 2008.   . Seizures     Past Surgical History  Procedure Laterality Date  . Cardiovascular stress test  2008    Stress nuclear study  .  Back surgery  1996  . Laparoscopic cholecystectomy  1990s  . Total abdominal hysterectomy  1999  . Laminectomy  1995    L4-L5  . Carpal tunnel release  1994  . Esophagogastroduodenoscopy  2008    Barrett's without dysplasia. esphagus dilated. antral erosions, h.pylori serologies negative.  . Colonoscopy  11/2011    Dr. Darrick Penna: Internal hemorrhoids, mild diverticulosis. Random colon biopsies negative.  . Givens capsule study  12/07/2011    Proximal small bowel, rare AVM. Distal small bowel, multiple ulcers noted  . Cardiac catheterization  2002  . Esophagogastroduodenoscopy  11/2011    Dr. Darrick Penna: Barrett's esophagus, mild gastritis, diverticulum in the second  portion of the duodenum repeat EGD 3 years. Small bowel biopsies negative. Gastric biopsy show reactive gastropathy but no H. pylori. Esophageal biopsies consistent with GERD.  [employer] capsule study N/A 09/27/2013    Procedure: GIVENS CAPSULE STUDY;  Surgeon: West Bali, MD;  Location: [facility];  Service: Endoscopy;  Laterality: N/A;  7:30  . Givens capsule study N/A 10/10/2013    Procedure: GIVENS CAPSULE STUDY;  Surgeon: West Bali, MD;  [facility]: [facility];  Service: Endoscopy;  Laterality: N/A;  7:30  . Colonoscopy N/A 11/08/2013    Procedure: COLONOSCOPY WITH ILEOSCOPY;  Surgeon: West Bali, MD;  [facility]: [facility];  Service: Endoscopy;  Laterality: N/A;  9:30 AM  . Esophageal biopsy N/A 11/08/2013    Procedure: BIOPSY  / Tissue sampling / ulcers present in small intestine;  Surgeon: West Bali, MD;  [facility]: [facility];  Service: Endoscopy;  Laterality: N/A;    Family History  Problem Relation Age of Onset  . Hypertension Mother   . Alzheimer's disease Mother   . Stroke Mother   . Heart attack Mother   . Heart disease Neg Hx   . Hypertension Other   . Colon cancer Neg Hx     Social History [name] reports that she quit smoking about 30 years ago. She has never used smokeless tobacco. [name] reports that she does not drink alcohol.  Review of Systems CONSTITUTIONAL: No weight loss, fever, chills, weakness or fatigue.  HEENT: Eyes: No visual loss, blurred vision, double vision or yellow sclerae. No hearing loss, sneezing, congestion, runny nose or sore throat.  SKIN: No rash or itching.  CARDIOVASCULAR: per HPI RESPIRATORY: per HPI.  GASTROINTESTINAL: No anorexia, nausea, vomiting or diarrhea. No abdominal pain or blood.  GENITOURINARY: no polyuria, no dysuria NEUROLOGICAL: No headache, dizziness, syncope, paralysis, ataxia, numbness or tingling in the extremities. No change in bowel or bladder control.  MUSCULOSKELETAL: No muscle, back pain,  joint pain or stiffness.  HEMATOLOGIC: No anemia, bleeding or bruising.  LYMPHATICS: No enlarged nodes. No history of splenectomy.  PSYCHIATRIC: No history of depression or anxiety.      Physical Examination Blood pressure 144/80, pulse 60, temperature 98.3 F (36.8 C), temperature source Oral, resp. rate 20, height 5\' 8"  (1.727 m), weight 196 lb (88.905 kg), SpO2 97.00%.  Intake/Output Summary (Last 24 hours) at 12/20/13 1043 Last data filed at 12/20/13 0919  Gross per 24 hour  Intake 521.75 ml  Output      0 ml  Net 521.75 ml    HEENT: sclera clear, throat clear  Cardiovascular: irreg, no m/r/g, no JVD  Respiratory: CTAB  GI: abdomen soft, NT, ND  MSK: no LE edema. Left chest wall tender to palpation  Neuro: no focal deficits  Psych: appropriate affect  Lab Results  Basic Metabolic Panel:  Recent Labs Lab 12/19/13 2009 12/20/13 0417  NA 143 144  K 4.2 3.9  CL 107 107  CO2 23 26  GLUCOSE 173* 122*  BUN 13 11  CREATININE 0.69 0.57  CALCIUM 8.7 8.8    Liver Function Tests:  Recent Labs Lab 12/19/13 2009 12/20/13 0417  AST 11 12  ALT 8 8  ALKPHOS 65 64  BILITOT 0.3 0.3  PROT 6.3 6.3  ALBUMIN 3.2* 3.1*    CBC:  Recent Labs Lab 12/19/13 2009 12/20/13 0417  WBC 4.8 6.2  NEUTROABS 2.1  --   HGB 10.7* 11.0*  HCT 33.9* 35.3*  MCV 89.7 91.2  PLT 207 221    Cardiac Enzymes:  Recent Labs Lab 12/19/13 2009 12/19/13 2219 12/20/13 0417  TROPONINI <0.30 <0.30 <0.30    BNP: No components found with this basename: POCBNP,    ECG   Imaging 06/2013 Echo Study Conclusions  - Left ventricle: The cavity size was normal. Wall thickness was increased in a pattern of moderate LVH. Systolic function was mildly reduced. The estimated ejection fraction was in the range of 45% to 50%. Diffuse hypokinesis. Doppler parameters are consistent with high ventricular filling pressure. - Mitral valve: Calcified annulus. Mildly thickened  leaflets . Mild regurgitation. - Left atrium: The atrium was moderately dilated. - Right ventricle: Systolic function was mildly reduced. - Right atrium: The atrium was moderately dilated. - Atrial septum: The septum bowed from left to right, consistent with increased left atrial pressure. - Pulmonary arteries: Systolic pressure was moderately increased. PA peak pressure: 54mm Hg (S).    Impression/Recommendations 1. Atypical chest pain - reproducible to palpation, ongoing continuously since yesterday at 630pm. No evidence of ACS by EKG or cardiac enzymes, appears to be non-cardiac pain - CT chest describes "severe cardiomegaly", will get echo to evaluate for any changes from prior study 06/2013 which showed low normal to mildy decreased LV systolic function with normal LV and RV size.  - if echo without significant change, do not see strong indication for inpatient ischemic testing. She has follow up next week with cardiology already on the 26th, can be reevaluated at that time.   2. Afib - no current symptoms, continue current meds - occasional 2 second pauses noted in very early morning hours presumably while patient asleep, nothing noted in normal daytime hours. Patient denies any symptoms, no change in therapy at this time   Dina Rich, M.D., F.A.C.C.

## 2013-12-21 ENCOUNTER — Encounter (HOSPITAL_COMMUNITY): Payer: Self-pay | Admitting: Emergency Medicine

## 2013-12-21 ENCOUNTER — Emergency Department (HOSPITAL_COMMUNITY): Payer: Medicare Other

## 2013-12-21 ENCOUNTER — Observation Stay (HOSPITAL_COMMUNITY)
Admission: EM | Admit: 2013-12-21 | Discharge: 2013-12-22 | Disposition: A | Discharge status: Home / Self Care | Payer: Medicare Other | Attending: Internal Medicine | Admitting: Internal Medicine

## 2013-12-21 DIAGNOSIS — Z87891 Personal history of nicotine dependence: Secondary | ICD-10-CM | POA: Insufficient documentation

## 2013-12-21 DIAGNOSIS — R059 Cough, unspecified: Secondary | ICD-10-CM | POA: Insufficient documentation

## 2013-12-21 DIAGNOSIS — J984 Other disorders of lung: Secondary | ICD-10-CM

## 2013-12-21 DIAGNOSIS — E669 Obesity, unspecified: Secondary | ICD-10-CM

## 2013-12-21 DIAGNOSIS — Z79899 Other long term (current) drug therapy: Secondary | ICD-10-CM | POA: Insufficient documentation

## 2013-12-21 DIAGNOSIS — R251 Tremor, unspecified: Secondary | ICD-10-CM

## 2013-12-21 DIAGNOSIS — R634 Abnormal weight loss: Secondary | ICD-10-CM

## 2013-12-21 DIAGNOSIS — K449 Diaphragmatic hernia without obstruction or gangrene: Secondary | ICD-10-CM | POA: Insufficient documentation

## 2013-12-21 DIAGNOSIS — F5000 Anorexia nervosa, unspecified: Secondary | ICD-10-CM

## 2013-12-21 DIAGNOSIS — R05 Cough: Secondary | ICD-10-CM | POA: Insufficient documentation

## 2013-12-21 DIAGNOSIS — Z8669 Personal history of other diseases of the nervous system and sense organs: Secondary | ICD-10-CM

## 2013-12-21 DIAGNOSIS — E785 Hyperlipidemia, unspecified: Secondary | ICD-10-CM

## 2013-12-21 DIAGNOSIS — R0600 Dyspnea, unspecified: Secondary | ICD-10-CM

## 2013-12-21 DIAGNOSIS — R000 Tachycardia, unspecified: Secondary | ICD-10-CM | POA: Insufficient documentation

## 2013-12-21 DIAGNOSIS — E119 Type 2 diabetes mellitus without complications: Secondary | ICD-10-CM

## 2013-12-21 DIAGNOSIS — G4730 Sleep apnea, unspecified: Secondary | ICD-10-CM

## 2013-12-21 DIAGNOSIS — G4733 Obstructive sleep apnea (adult) (pediatric): Secondary | ICD-10-CM | POA: Insufficient documentation

## 2013-12-21 DIAGNOSIS — E039 Hypothyroidism, unspecified: Secondary | ICD-10-CM

## 2013-12-21 DIAGNOSIS — D649 Anemia, unspecified: Secondary | ICD-10-CM | POA: Insufficient documentation

## 2013-12-21 DIAGNOSIS — I4891 Unspecified atrial fibrillation: Secondary | ICD-10-CM

## 2013-12-21 DIAGNOSIS — F411 Generalized anxiety disorder: Secondary | ICD-10-CM | POA: Insufficient documentation

## 2013-12-21 DIAGNOSIS — R1030 Lower abdominal pain, unspecified: Secondary | ICD-10-CM

## 2013-12-21 DIAGNOSIS — I951 Orthostatic hypotension: Secondary | ICD-10-CM

## 2013-12-21 DIAGNOSIS — D509 Iron deficiency anemia, unspecified: Secondary | ICD-10-CM

## 2013-12-21 DIAGNOSIS — I10 Essential (primary) hypertension: Secondary | ICD-10-CM

## 2013-12-21 DIAGNOSIS — G40909 Epilepsy, unspecified, not intractable, without status epilepticus: Secondary | ICD-10-CM | POA: Insufficient documentation

## 2013-12-21 DIAGNOSIS — R072 Precordial pain: Secondary | ICD-10-CM

## 2013-12-21 DIAGNOSIS — Z7901 Long term (current) use of anticoagulants: Secondary | ICD-10-CM

## 2013-12-21 DIAGNOSIS — K219 Gastro-esophageal reflux disease without esophagitis: Secondary | ICD-10-CM | POA: Insufficient documentation

## 2013-12-21 DIAGNOSIS — I5021 Acute systolic (congestive) heart failure: Principal | ICD-10-CM

## 2013-12-21 DIAGNOSIS — Z794 Long term (current) use of insulin: Secondary | ICD-10-CM | POA: Insufficient documentation

## 2013-12-21 DIAGNOSIS — Z5181 Encounter for therapeutic drug level monitoring: Secondary | ICD-10-CM

## 2013-12-21 DIAGNOSIS — G8929 Other chronic pain: Secondary | ICD-10-CM | POA: Insufficient documentation

## 2013-12-21 DIAGNOSIS — R079 Chest pain, unspecified: Secondary | ICD-10-CM

## 2013-12-21 DIAGNOSIS — K2270 Barrett's esophagus without dysplasia: Secondary | ICD-10-CM

## 2013-12-21 DIAGNOSIS — I509 Heart failure, unspecified: Secondary | ICD-10-CM | POA: Diagnosis present

## 2013-12-21 DIAGNOSIS — K589 Irritable bowel syndrome without diarrhea: Secondary | ICD-10-CM

## 2013-12-21 DIAGNOSIS — K3184 Gastroparesis: Secondary | ICD-10-CM

## 2013-12-21 DIAGNOSIS — R197 Diarrhea, unspecified: Secondary | ICD-10-CM

## 2013-12-21 LAB — D-DIMER, QUANTITATIVE (NOT AT ARMC): D-Dimer, Quant: <0.27 ug/mL-FEU (ref 0.00–0.48)

## 2013-12-21 LAB — URINALYSIS, ROUTINE W REFLEX MICROSCOPIC
Bilirubin Urine: NEGATIVE
Glucose, UA: NEGATIVE mg/dL
HGB URINE DIPSTICK: NEGATIVE
Leukocytes, UA: NEGATIVE
NITRITE: NEGATIVE
Protein, ur: NEGATIVE mg/dL
Specific Gravity, Urine: 1.025 (ref 1.005–1.030)
UROBILINOGEN UA: 4 mg/dL — AB (ref 0.0–1.0)
pH: 5.5 (ref 5.0–8.0)

## 2013-12-21 LAB — CBC WITH DIFFERENTIAL/PLATELET
BASOS PCT: 0 % (ref 0–1)
Basophils Absolute: 0 10*3/uL (ref 0.0–0.1)
EOS ABS: 0 10*3/uL (ref 0.0–0.7)
EOS PCT: 0 % (ref 0–5)
HEMATOCRIT: 34.7 % — AB (ref 36.0–46.0)
HEMOGLOBIN: 11.1 g/dL — AB (ref 12.0–15.0)
Lymphocytes Relative: 21 % (ref 12–46)
Lymphs Abs: 1.9 10*3/uL (ref 0.7–4.0)
MCH: 28.3 pg (ref 26.0–34.0)
MCHC: 32 g/dL (ref 30.0–36.0)
MCV: 88.5 fL (ref 78.0–100.0)
MONO ABS: 1.3 10*3/uL — AB (ref 0.1–1.0)
MONOS PCT: 15 % — AB (ref 3–12)
Neutro Abs: 5.5 10*3/uL (ref 1.7–7.7)
Neutrophils Relative %: 64 % (ref 43–77)
Platelets: 206 10*3/uL (ref 150–400)
RBC: 3.92 MIL/uL (ref 3.87–5.11)
RDW: 15.1 % (ref 11.5–15.5)
WBC: 8.7 10*3/uL (ref 4.0–10.5)

## 2013-12-21 LAB — COMPREHENSIVE METABOLIC PANEL
ALBUMIN: 3.3 g/dL — AB (ref 3.5–5.2)
ALT: 10 U/L (ref 0–35)
AST: 17 U/L (ref 0–37)
Alkaline Phosphatase: 75 U/L (ref 39–117)
BILIRUBIN TOTAL: 0.7 mg/dL (ref 0.3–1.2)
BUN: 11 mg/dL (ref 6–23)
CALCIUM: 9.2 mg/dL (ref 8.4–10.5)
CO2: 25 mEq/L (ref 19–32)
Chloride: 102 mEq/L (ref 96–112)
Creatinine, Ser: 0.53 mg/dL (ref 0.50–1.10)
GFR calc Af Amer: >90 mL/min (ref >90)
GFR calc non Af Amer: >90 mL/min (ref >90)
Glucose, Bld: 204 mg/dL — ABNORMAL HIGH (ref 70–99)
Potassium: 4.9 mEq/L (ref 3.7–5.3)
Sodium: 139 mEq/L (ref 137–147)
TOTAL PROTEIN: 6.7 g/dL (ref 6.0–8.3)

## 2013-12-21 LAB — GLUCOSE, CAPILLARY: Glucose-Capillary: 99 mg/dL (ref 70–99)

## 2013-12-21 LAB — TROPONIN I: Troponin I: <0.3 ng/mL (ref <0.30)

## 2013-12-21 LAB — PRO B NATRIURETIC PEPTIDE: PRO B NATRI PEPTIDE: 7746 pg/mL — AB (ref 0–125)

## 2013-12-21 MED ORDER — LORAZEPAM 2 MG/ML IJ SOLN
0.5000 mg | Freq: Once | INTRAMUSCULAR | Status: AC
  Administered 2013-12-21: 0.5 mg via INTRAVENOUS
  Filled 2013-12-21: qty 1

## 2013-12-21 MED ORDER — FUROSEMIDE 10 MG/ML IJ SOLN
40.0000 mg | Freq: Once | INTRAMUSCULAR | Status: AC
  Administered 2013-12-21: 40 mg via INTRAVENOUS
  Filled 2013-12-21: qty 4

## 2013-12-21 NOTE — Discharge Summary (Signed)
Physician Discharge Summary  Patient ID: Tara KrebsMona H [name] MRN: [id number] DOB/AGE: 09-03-1950 63 y.o. Primary Care Physician:KNOWLTON,STEPHEN D, MD Admit date: 12/19/2013 Discharge date: 12/21/2013    Discharge Diagnoses:  1. Atypical chest pain, no evidence of mitral ischemia. Pain is likely musculoskeletal. Follow with cardiology soon. 2. Hypertension. 3. Chronic atrial fibrillation on chronic anticoagulation. 4. Type 2 diabetes mellitus. 5. Hyperlipidemia. 6. Obesity.      Medication List    STOP taking these medications       TYLENOL ARTHRITIS PAIN 650 MG CR tablet  Generic drug:  acetaminophen      TAKE these medications       apixaban 5 MG Tabs tablet  Commonly known as:  ELIQUIS  Take 1 tablet (5 mg total) by mouth 2 (two) times daily.     baclofen 10 MG tablet  Commonly known as:  LIORESAL  Take 10 mg by mouth 2 (two) times daily.     diltiazem 240 MG 24 hr capsule  Commonly known as:  CARDIZEM CD  Take 240 mg by mouth every evening.     furosemide 40 MG tablet  Commonly known as:  LASIX  Take 40 mg by mouth daily.     glipiZIDE 10 MG tablet  Commonly known as:  GLUCOTROL  Take 10 mg by mouth 2 (two) times daily.     HYDROcodone-acetaminophen 10-325 MG per tablet  Commonly known as:  NORCO  Take 1 tablet by mouth 2 (two) times daily as needed for moderate pain or severe pain.     LANTUS SOLOSTAR 100 UNIT/ML Solostar Pen  Generic drug:  Insulin Glargine  Inject 22 Units into the skin at bedtime.     levETIRAcetam 250 MG tablet  Commonly known as:  KEPPRA  Take 1 tablet (250 mg total) by mouth 2 (two) times daily.     levothyroxine 125 MCG tablet  Commonly known as:  SYNTHROID, LEVOTHROID  Take 125 mcg by mouth daily.     lipase/protease/amylase 4098112000 UNITS Cpep capsule  Commonly known as:  CREON-12/PANCREASE  Take 1-2 capsules by mouth 3 (three) times daily with meals. 2 with meals and 1 with snacks     loperamide 2 MG capsule  Commonly known  as:  IMODIUM  Take 4 mg by mouth as needed for diarrhea or loose stools.     LORazepam 1 MG tablet  Commonly known as:  ATIVAN  Take 1-2 mg by mouth daily as needed for anxiety or sleep. For anxiety     metFORMIN 500 MG tablet  Commonly known as:  GLUCOPHAGE  Take 1,000 mg by mouth 2 (two) times daily with a meal.     metoCLOPramide 10 MG tablet  Commonly known as:  REGLAN  Take 10 mg by mouth 4 (four) times daily.     metoprolol 100 MG tablet  Commonly known as:  LOPRESSOR  Take 100 mg by mouth 2 (two) times daily.     nitroGLYCERIN 0.4 MG SL tablet  Commonly known as:  NITROSTAT  - Place 0.4 mg under the tongue every 5 (five) minutes as needed for chest pain. For chest pain  -      omeprazole 20 MG capsule  Commonly known as:  PRILOSEC  Take 20 mg by mouth 2 (two) times daily.     ondansetron 8 MG tablet  Commonly known as:  ZOFRAN  Take 8 mg by mouth daily as needed. nausea     potassium chloride SA 20 MEQ tablet  Commonly known as:  K-DUR,KLOR-CON  Take 20 mEq by mouth 3 (three) times daily.     simvastatin 40 MG tablet  Commonly known as:  ZOCOR  Take 40 mg by mouth at bedtime.        Discharged Condition: Stable and improved.    Consults: Cardiology, Dr. Wyline Mood.  Significant Diagnostic Studies: Dg Chest 2 View  12/03/2013   CLINICAL DATA:  Chest pain, CHF  EXAM: CHEST  2 VIEW  COMPARISON:  CT chest dated 10/13/2013  FINDINGS: No frank interstitial edema. Elevation of the left hemidiaphragm with possible small left pleural effusion. No pneumothorax.  Stable cardiomegaly.  Degenerative changes of the visualized thoracolumbar spine.  IMPRESSION: No frank interstitial edema.  Possible small left pleural effusion.   Electronically Signed   By: Charline Bills M.D.   On: 12/03/2013 18:28   Ct Chest W Contrast  12/20/2013   CLINICAL DATA:  63 year old female with chest pain. Shortness of Breath. Initial encounter.  EXAM: CT CHEST WITH CONTRAST  TECHNIQUE:  Multidetector CT imaging of the chest was performed during intravenous contrast administration.  CONTRAST:  80mL OMNIPAQUE IOHEXOL 300 MG/ML  SOLN  COMPARISON:  Portable chest radiograph 12/19/2013 and earlier. Chest CT 10/13/2013 and earlier.  FINDINGS: Intermittently degraded by respiratory motion artifact. Continued low lung volumes. Interval decreased pulmonary septal thickening and peribronchovascular ground-glass opacity. Retrocardiac and right lung base platelike atelectasis and/or scarring not significantly changed. Major airways are patent except for atelectatic changes. No pulmonary consolidation.  Increased extrapleural fat bilaterally.  Trace if any pleural fluid.  Negative thoracic inlet. Cardiomegaly. No pericardial effusion. Central pulmonary artery enlargement is stable. Grossly patent central pulmonary arteries. Stable thoracic aorta. No mediastinal lymphadenopathy. No axillary lymphadenopathy.  Stable visualized upper abdominal viscera. The stomach is more distended with fluid today.  Stable visualized osseous structures.  IMPRESSION: 1. Severe cardiomegaly and evidence of chronic pulmonary artery hypertension. 2. Regressed/resolved pulmonary interstitial edema compared to 10/13/2013. Stable lung base atelectasis and/or scarring. 3. Trace if any pleural fluid.  No pericardial effusion.   Electronically Signed   By: Augusto Gamble M.D.   On: 12/20/2013 11:12   Dg Chest Port 1 View  12/19/2013   CLINICAL DATA:  Left-sided chest pain and shortness of breath since 6:30 p.m. History of CHF, hypertension, diabetes.  EXAM: PORTABLE CHEST - 1 VIEW  COMPARISON:  12/03/2013  FINDINGS: The heart is enlarged. There is left lung base density which partially obscures the hemidiaphragm. Findings are consistent with atelectasis or mild infiltrate. Aeration appears improved since exams earlier in the year. There is mild chronic bronchitic change. No overt edema.  IMPRESSION: 1. Cardiomegaly, stable. 2. Persistent but  improved density in the left lung base.   Electronically Signed   By: Rosalie Gums M.D.   On: 12/19/2013 20:39    Lab Results: Basic Metabolic Panel:  Recent Labs  40/98/11 2009 12/20/13 0417  NA 143 144  K 4.2 3.9  CL 107 107  CO2 23 26  GLUCOSE 173* 122*  BUN 13 11  CREATININE 0.69 0.57  CALCIUM 8.7 8.8   Liver Function Tests:  Recent Labs  12/19/13 2009 12/20/13 0417  AST 11 12  ALT 8 8  ALKPHOS 65 64  BILITOT 0.3 0.3  PROT 6.3 6.3  ALBUMIN 3.2* 3.1*     CBC:  Recent Labs  12/19/13 2009 12/20/13 0417  WBC 4.8 6.2  NEUTROABS 2.1  --   HGB 10.7* 11.0*  HCT 33.9* 35.3*  MCV  89.7 91.2  PLT 207 221    Recent Results (from the past 240 hour(s))  MRSA PCR SCREENING     Status: None   Collection Time    12/19/13 11:45 PM      Result Value Ref Range Status   MRSA by PCR NEGATIVE  NEGATIVE Final   Comment:            The GeneXpert MRSA Assay (FDA     approved for NASAL specimens     only), is one component of a     comprehensive MRSA colonization     surveillance program. It is not     intended to diagnose MRSA     infection nor to guide or     monitor treatment for     MRSA infections.     Hospital Course: This 63 year old lady presented to the hospital once again with left-sided chest pain. The chest pain was reproducible on palpation. Serial cardiac enzymes were negative. CT scan of the chest did not reveal any pulmonary pathology. There was apparently large cardiomegaly. Echocardiogram was done which did not show any significant changes although there was increase in the pulmonary hypertension. She was seen by cardiology, who is recommended followup as an outpatient and did not feel that inpatient testing was necessary at this point. She was given intravenous dose of Toradol, this actually helped her pain. Since she is on anticoagulation medication, I have told her that I do not want taking nonsteroidal anti-inflammatory medications on a regular basis but  that taking acetaminophen, 500 mg pills maybe 3-4 times a day would be appropriate. She'll follow with her primary care physician and also cardiology soon. I have encouraged to take furosemide on a daily basis. She stable for discharge today.  Discharge Exam: Blood pressure 131/82, pulse 79, temperature 97.4 F (36.3 C), temperature source Oral, resp. rate 20, height 5\' 8"  (1.727 m), weight 88.905 kg (196 lb), SpO2 97.00%. She looks chronically sick but does not look acutely unwell at this point. There is no respiratory distress. Heart sounds are present in atrial fibrillation clinically. Lung fields are clear with a few crackles at the left base. There is no bronchial breathing or wheezing. She is alert and orientated without any focal neurological signs.  Disposition: Home.      Discharge Instructions   Diet - low sodium heart healthy    Complete by:  As directed      Increase activity slowly    Complete by:  As directed              Signed: GOSRANI,NIMISH C   12/21/2013, 9:45 AM

## 2013-12-21 NOTE — ED Provider Notes (Signed)
CSN: [id number]     Arrival date & time 12/21/13  2054 History   First MD Initiated Contact with Patient 12/21/13 2103    This chart was scribed for [name], MD by Marica Otter, ED Scribe. This patient was seen in room APA06/[facility]06 and the patient's care was started at 9:08 PM.  Chief Complaint  Patient presents with  . Shortness of Breath   Patient is a 63 y.o. female presenting with shortness of breath. The history is provided by the patient and a relative. No language interpreter was used.  Shortness of Breath Severity:  Mild Timing:  Constant Chronicity:  New Associated symptoms: cough   Associated symptoms: no abdominal pain, no chest pain, no headaches, no rash and no vomiting    HPI Comments: [name] is a 63 y.o. female, with an extensive medical Hx noted below, who presents to the Emergency Department complaining of worsening SOB onset today. Specifically, pt reports that she has been short of breath all day, however, it began to worsen around 4:00PM this evening. Pt reports she was recently hospitalized for chest pressure adn Dx with costochondritis and discharged today at 11:00AM. Pt also complains of intermittent nonproductive cough. Pt denies nausea, vomiting and diarrhea.  Past Medical History  Diagnosis Date  . Chest pain     Negative cardiac catheterization in 2002; negative stress nuclear study in 2008  . Congestive heart failure     Normal LV systolic function  . PAF (paroxysmal atrial fibrillation) 2009  . Chronic anticoagulation   . Diabetes mellitus, type 2     Insulin therapy; exacerbated by prednisone  . Syncope     Admitted 05/2009; magnetic resonance imagin/ MRA - negative; etiology thought to be orthostasis secondary to drugs and dehydration  . GERD (gastroesophageal reflux disease)   . IBS (irritable bowel syndrome)   . Hiatal hernia   . Gastroparesis     99% retention 05/2008 on GES  . Hyperlipidemia   . Hypertension   . Hypothyroid   .  Chronic LBP     Surgical intervention in 1996  . Obesity   . OSA on CPAP   . Anemia     H/H of 10/30 with a normal MCV in 12/09  . Barrett's esophagus     Diagnosed 1995. Last EGD 2008.   . Seizures    Past Surgical History  Procedure Laterality Date  . Cardiovascular stress test  2008    Stress nuclear study  . Back surgery  1996  . Laparoscopic cholecystectomy  1990s  . Total abdominal hysterectomy  1999  . Laminectomy  1995    L4-L5  . Carpal tunnel release  1994  . Esophagogastroduodenoscopy  2008    Barrett's without dysplasia. esphagus dilated. antral erosions, h.pylori serologies negative.  . Colonoscopy  11/2011    Dr. Darrick Penna: Internal hemorrhoids, mild diverticulosis. Random colon biopsies negative.  . Givens capsule study  12/07/2011    Proximal small bowel, rare AVM. Distal small bowel, multiple ulcers noted  . Cardiac catheterization  2002  . Esophagogastroduodenoscopy  11/2011    Dr. Darrick Penna: Barrett's esophagus, mild gastritis, diverticulum in the second portion of the duodenum repeat EGD 3 years. Small bowel biopsies negative. Gastric biopsy show reactive gastropathy but no H. pylori. Esophageal biopsies consistent with GERD.  [employer] capsule study N/A 09/27/2013    Procedure: GIVENS CAPSULE STUDY;  Surgeon: West Bali, MD;  Location: [facility];  Service: Endoscopy;  Laterality: N/A;  7:30  . Givens capsule study N/A 10/10/2013    Procedure: GIVENS CAPSULE STUDY;  Surgeon: West Bali, MD;  Location: [facility];  Service: Endoscopy;  Laterality: N/A;  7:30  . Colonoscopy N/A 11/08/2013    Procedure: COLONOSCOPY WITH ILEOSCOPY;  Surgeon: West Bali, MD;  Location: [facility];  Service: Endoscopy;  Laterality: N/A;  9:30 AM  . Esophageal biopsy N/A 11/08/2013    Procedure: BIOPSY  / Tissue sampling / ulcers present in small intestine;  Surgeon: West Bali, MD;  Location: [facility];  Service: Endoscopy;  Laterality: N/A;   Family History  Problem  Relation Age of Onset  . Hypertension Mother   . Alzheimer's disease Mother   . Stroke Mother   . Heart attack Mother   . Heart disease Neg Hx   . Hypertension Other   . Colon cancer Neg Hx    History  Substance Use Topics  . Smoking status: Former Smoker -- 15 years    Quit date: 07/01/1983  . Smokeless tobacco: Never Used     Comment: Remote  . Alcohol Use: No     Comment: last etoh one year ago, never frequent   OB History   Grav Para Term Preterm Abortions TAB SAB Ect Mult Living   2 2 2             Review of Systems  Constitutional: Negative for appetite change and fatigue.  HENT: Negative for congestion, ear discharge and sinus pressure.   Eyes: Negative for discharge.  Respiratory: Positive for cough and shortness of breath.   Cardiovascular: Negative for chest pain.  Gastrointestinal: Negative for nausea, vomiting, abdominal pain and diarrhea.  Genitourinary: Negative for frequency and hematuria.  Musculoskeletal: Negative for back pain.  Skin: Negative for rash.  Neurological: Negative for seizures and headaches.  Psychiatric/Behavioral: Negative for hallucinations. The patient is nervous/anxious.       Allergies  Ibuprofen; Celexa; and Codeine  Home Medications   Prior to Admission medications   Medication Sig Start Date End Date Taking? Authorizing Provider  apixaban (ELIQUIS) 5 MG TABS tablet Take 1 tablet (5 mg total) by mouth 2 (two) times daily. 12/16/13   Antoine Poche, MD  baclofen (LIORESAL) 10 MG tablet Take 10 mg by mouth 2 (two) times daily. 11/15/13   Historical Provider, MD  diltiazem (CARDIZEM CD) 240 MG 24 hr capsule Take 240 mg by mouth every evening. 07/04/13   [name], MD  furosemide (LASIX) 40 MG tablet Take 40 mg by mouth daily. 12/03/13   Historical Provider, MD  glipiZIDE (GLUCOTROL) 10 MG tablet Take 10 mg by mouth 2 (two) times daily.     Historical Provider, MD  HYDROcodone-acetaminophen (NORCO) 10-325 MG per tablet Take 1  tablet by mouth 2 (two) times daily as needed for moderate pain or severe pain.  10/23/13   Historical Provider, MD  Insulin Glargine (LANTUS SOLOSTAR) 100 UNIT/ML Solostar Pen Inject 22 Units into the skin at bedtime.     Historical Provider, MD  levETIRAcetam (KEPPRA) 250 MG tablet Take 1 tablet (250 mg total) by mouth 2 (two) times daily. 11/10/12   [name], MD  levothyroxine (SYNTHROID, LEVOTHROID) 125 MCG tablet Take 125 mcg by mouth daily.    Historical Provider, MD  lipase/protease/amylase (CREON-12/PANCREASE) 12000 UNITS CPEP capsule Take 1-2 capsules by mouth 3 (three) times daily with meals. 2 with meals and 1 with snacks    Historical Provider, MD  loperamide (IMODIUM) 2 MG capsule Take 4 mg by mouth as needed for diarrhea or loose stools.     Historical Provider, MD  LORazepam (ATIVAN) 1 MG tablet Take 1-2 mg by mouth daily as needed for anxiety or sleep. For anxiety    Historical Provider, MD  metFORMIN (GLUCOPHAGE) 500 MG tablet Take 1,000 mg by mouth 2 (two) times daily with a meal.     Historical Provider, MD  metoCLOPramide (REGLAN) 10 MG tablet Take 10 mg by mouth 4 (four) times daily.  09/20/12   Historical Provider, MD  metoprolol (LOPRESSOR) 100 MG tablet Take 100 mg by mouth 2 (two) times daily.    Historical Provider, MD  nitroGLYCERIN (NITROSTAT) 0.4 MG SL tablet Place 0.4 mg under the tongue every 5 (five) minutes as needed for chest pain. For chest pain     Historical Provider, MD  omeprazole (PRILOSEC) 20 MG capsule Take 20 mg by mouth 2 (two) times daily.      Historical Provider, MD  ondansetron (ZOFRAN) 8 MG tablet Take 8 mg by mouth daily as needed. nausea 11/01/11   Historical Provider, MD  potassium chloride SA (K-DUR,KLOR-CON) 20 MEQ tablet Take 20 mEq by mouth 3 (three) times daily.     Historical Provider, MD  simvastatin (ZOCOR) 40 MG tablet Take 40 mg by mouth at bedtime.     Historical Provider, MD   Triage Vitals: BP 147/94  Pulse 108  Temp(Src) 98 F  (36.7 C) (Oral)  Resp 20  Ht 5\' 8"  (1.727 m)  Wt 196 lb (88.905 kg)  BMI 29.81 kg/m2  SpO2 95% Physical Exam  Constitutional: She is oriented to person, place, and time. She appears well-developed.  HENT:  Head: Normocephalic.  Eyes: Conjunctivae and EOM are normal. No scleral icterus.  Neck: Neck supple. No thyromegaly present.  Cardiovascular: Exam reveals no gallop and no friction rub.   No murmur heard. tachycardic   Pulmonary/Chest: No stridor. She has no wheezes. She has no rales. She exhibits no tenderness.  Abdominal: She exhibits no distension. There is no tenderness. There is no rebound.  Musculoskeletal: Normal range of motion. She exhibits no edema.  Lymphadenopathy:    She has no cervical adenopathy.  Neurological: She is oriented to person, place, and time. She exhibits normal muscle tone. Coordination normal.  Skin: No rash noted. No erythema.  Psychiatric:  anxious    ED Course  Procedures (including critical care time) DIAGNOSTIC STUDIES: Oxygen Saturation is 95% on RA, adequate by my interpretation.    COORDINATION OF CARE: 9:11 PM-Discussed treatment plan which includes meds, imaging and labs with pt at bedside and pt agreed to plan.   Labs Review Labs Reviewed - No data to display  Imaging Review Ct Chest W Contrast  12/20/2013   CLINICAL DATA:  63 year old female with chest pain. Shortness of Breath. Initial encounter.  EXAM: CT CHEST WITH CONTRAST  TECHNIQUE: Multidetector CT imaging of the chest was performed during intravenous contrast administration.  CONTRAST:  80mL OMNIPAQUE IOHEXOL 300 MG/ML  SOLN  COMPARISON:  Portable chest radiograph 12/19/2013 and earlier. Chest CT 10/13/2013 and earlier.  FINDINGS: Intermittently degraded by respiratory motion artifact. Continued low lung volumes. Interval decreased pulmonary septal thickening and peribronchovascular ground-glass opacity. Retrocardiac and right lung base platelike atelectasis and/or scarring  not significantly changed. Major airways are patent except for atelectatic changes. No pulmonary consolidation.  Increased extrapleural fat bilaterally.  Trace if any pleural fluid.  Negative thoracic inlet. Cardiomegaly. No pericardial effusion.  Central pulmonary artery enlargement is stable. Grossly patent central pulmonary arteries. Stable thoracic aorta. No mediastinal lymphadenopathy. No axillary lymphadenopathy.  Stable visualized upper abdominal viscera. The stomach is more distended with fluid today.  Stable visualized osseous structures.  IMPRESSION: 1. Severe cardiomegaly and evidence of chronic pulmonary artery hypertension. 2. Regressed/resolved pulmonary interstitial edema compared to 10/13/2013. Stable lung base atelectasis and/or scarring. 3. Trace if any pleural fluid.  No pericardial effusion.   Electronically Signed   By: Augusto Gamble M.D.   On: 12/20/2013 11:12     EKG Interpretation   Date/Time:  Saturday December 21 2013 21:22:42 EDT Ventricular Rate:  99 PR Interval:    QRS Duration: 97 QT Interval:  354 QTC Calculation: 454 R Axis:   -41 Text Interpretation:  Atrial fibrillation Left anterior fascicular block  Abnormal R-wave progression, early transition Confirmed by Zaylei Mullane  MD,  Luismanuel Corman [phone]) on 12/21/2013 9:26:31 PM      MDM   Final diagnoses:  None    The chart was scribed for me under my direct supervision.  I personally performed the history, physical, and medical decision making and all procedures in the evaluation of this patient.[name], MD 12/21/13 [phone]

## 2013-12-21 NOTE — ED Notes (Signed)
Paged Dr. Rito Ehrlich for Dr. Pati Gallo consult to hospitalist

## 2013-12-21 NOTE — H&P (Signed)
[facility] History and Physical  [name] YFV:[id number] DOB: 05-20-1951 DOA: 12/21/2013   PCP: Robert Bellow, MD  Specialists: Followed by cardiology in [place].  Chief Complaint: Shortness of breath  HPI: [name] is a 63 y.o. female with a past with history of atrial fibrillation on anticoagulation, diabetes on insulin, hypertension, hypothyroidism, who was discharged from the hospital earlier today after being managed for chest pain. She was seen by cardiology in consultation. She denied having any chest pain when she went home, but after she reached home she started getting short of breath. She started feeling dizzy. Denies any syncopal episode. Then, she started having left-sided chest pain. There was no fever. No chills. She did have a cough with yellowish expectoration. Has some leg swelling.  She has chronic right lower extremity swelling compared to the left. She had some wheezing. The shortness of breath is somewhat worse with lying down. So, she decided to come in to the hospital. She takes Lasix only as needed for leg swelling. Hasn't taken it in a few days. Denies any other changes to her medications. Currently denies any chest pain. Patient was noted to have oxygen saturations in the late 80s when she presented and which corrected after application of oxygen.  Home Medications: Prior to Admission medications   Medication Sig Start Date End Date Taking? Authorizing Provider  apixaban (ELIQUIS) 5 MG TABS tablet Take 1 tablet (5 mg total) by mouth 2 (two) times daily. 12/16/13  Yes Arnoldo Lenis, MD  baclofen (LIORESAL) 10 MG tablet Take 10 mg by mouth 2 (two) times daily. 11/15/13  Yes Historical Provider, MD  diltiazem (CARDIZEM CD) 240 MG 24 hr capsule Take 240 mg by mouth every evening. 07/04/13  Yes Robert Bellow, MD  furosemide (LASIX) 40 MG tablet Take 40 mg by mouth daily. 12/03/13  Yes Historical Provider, MD  glipiZIDE (GLUCOTROL) 10 MG tablet  Take 10 mg by mouth 2 (two) times daily.    Yes Historical Provider, MD  HYDROcodone-acetaminophen (NORCO) 10-325 MG per tablet Take 1 tablet by mouth 2 (two) times daily as needed for moderate pain or severe pain.  10/23/13  Yes Historical Provider, MD  Insulin Glargine (LANTUS SOLOSTAR) 100 UNIT/ML Solostar Pen Inject 22 Units into the skin at bedtime.    Yes Historical Provider, MD  levETIRAcetam (KEPPRA) 250 MG tablet Take 1 tablet (250 mg total) by mouth 2 (two) times daily. 11/10/12  Yes Robert Bellow, MD  levothyroxine (SYNTHROID, LEVOTHROID) 125 MCG tablet Take 125 mcg by mouth daily.   Yes Historical Provider, MD  loperamide (IMODIUM) 2 MG capsule Take 4 mg by mouth as needed for diarrhea or loose stools.    Yes Historical Provider, MD  LORazepam (ATIVAN) 1 MG tablet Take 1-2 mg by mouth daily as needed for anxiety or sleep. For anxiety   Yes Historical Provider, MD  metFORMIN (GLUCOPHAGE) 500 MG tablet Take 1,000 mg by mouth 2 (two) times daily with a meal.    Yes Historical Provider, MD  metoCLOPramide (REGLAN) 10 MG tablet Take 10 mg by mouth 4 (four) times daily.  09/20/12  Yes Historical Provider, MD  metoprolol (LOPRESSOR) 100 MG tablet Take 100 mg by mouth 2 (two) times daily.   Yes Historical Provider, MD  nitroGLYCERIN (NITROSTAT) 0.4 MG SL tablet Place 0.4 mg under the tongue every 5 (five) minutes as needed for chest pain. For chest pain    Yes Historical Provider, MD  omeprazole (PRILOSEC) 20  MG capsule Take 20 mg by mouth 2 (two) times daily.     Yes Historical Provider, MD  ondansetron (ZOFRAN) 8 MG tablet Take 8 mg by mouth daily as needed. nausea 11/01/11  Yes Historical Provider, MD  potassium chloride SA (K-DUR,KLOR-CON) 20 MEQ tablet Take 20 mEq by mouth 3 (three) times daily.    Yes Historical Provider, MD  simvastatin (ZOCOR) 40 MG tablet Take 40 mg by mouth at bedtime.    Yes Historical Provider, MD    Allergies:  Allergies  Allergen Reactions  . Ibuprofen      Upsets her stomach and causes abdominal pain  . Celexa [Citalopram Hydrobromide]   . Codeine Nausea And Vomiting    HALLUCINATIONS    Past Medical History: Past Medical History  Diagnosis Date  . Chest pain     Negative cardiac catheterization in 2002; negative stress nuclear study in 2008  . Congestive heart failure     Normal LV systolic function  . PAF (paroxysmal atrial fibrillation) 2009  . Chronic anticoagulation   . Diabetes mellitus, type 2     Insulin therapy; exacerbated by prednisone  . Syncope     Admitted 05/2009; magnetic resonance imagin/ MRA - negative; etiology thought to be orthostasis secondary to drugs and dehydration  . GERD (gastroesophageal reflux disease)   . IBS (irritable bowel syndrome)   . Hiatal hernia   . Gastroparesis     99% retention 05/2008 on GES  . Hyperlipidemia   . Hypertension   . Hypothyroid   . Chronic LBP     Surgical intervention in 1996  . Obesity   . OSA on CPAP   . Anemia     H/H of 10/30 with a normal MCV in 12/09  . Barrett's esophagus     Diagnosed 1995. Last EGD 2008.   . Seizures     Past Surgical History  Procedure Laterality Date  . Cardiovascular stress test  2008    Stress nuclear study  . Back surgery  1996  . Laparoscopic cholecystectomy  1990s  . Total abdominal hysterectomy  1999  . Laminectomy  1995    L4-L5  . Carpal tunnel release  1994  . Esophagogastroduodenoscopy  2008    Barrett's without dysplasia. esphagus dilated. antral erosions, h.pylori serologies negative.  . Colonoscopy  11/2011    Dr. Oneida Alar: Internal hemorrhoids, mild diverticulosis. Random colon biopsies negative.  . Givens capsule study  12/07/2011    Proximal small bowel, rare AVM. Distal small bowel, multiple ulcers noted  . Cardiac catheterization  2002  . Esophagogastroduodenoscopy  11/2011    Dr. Oneida Alar: Barrett's esophagus, mild gastritis, diverticulum in the second portion of the duodenum repeat EGD 3 years. Small bowel biopsies  negative. Gastric biopsy show reactive gastropathy but no H. pylori. Esophageal biopsies consistent with GERD.  Freda Munro capsule study N/A 09/27/2013    Procedure: GIVENS CAPSULE STUDY;  Surgeon: Danie Binder, MD;  Location: [facility];  Service: Endoscopy;  Laterality: N/A;  7:30  . Givens capsule study N/A 10/10/2013    Procedure: GIVENS CAPSULE STUDY;  Surgeon: Danie Binder, MD;  Location: [facility];  Service: Endoscopy;  Laterality: N/A;  7:30  . Colonoscopy N/A 11/08/2013    Procedure: COLONOSCOPY WITH ILEOSCOPY;  Surgeon: Danie Binder, MD;  Location: [facility];  Service: Endoscopy;  Laterality: N/A;  9:30 AM  . Esophageal biopsy N/A 11/08/2013    Procedure: BIOPSY  / Tissue sampling / ulcers  present in small intestine;  Surgeon: Danie Binder, MD;  Location: [facility];  Service: Endoscopy;  Laterality: N/A;    Social History: She lives and weeks for with her husband and daughter. No smoking. No alcohol use. Uses a walker occasionally to ambulate.  Family History:  Family History  Problem Relation Age of Onset  . Hypertension Mother   . Alzheimer's disease Mother   . Stroke Mother   . Heart attack Mother   . Heart disease Neg Hx   . Hypertension Other   . Colon cancer Neg Hx      Review of Systems - History obtained from the patient General ROS: positive for  - fatigue Psychological ROS: negative Ophthalmic ROS: negative ENT ROS: negative Allergy and Immunology ROS: negative Hematological and Lymphatic ROS: negative Endocrine ROS: negative Respiratory ROS: as in hpi Cardiovascular ROS: as in hpi Gastrointestinal ROS: no abdominal pain, change in bowel habits, or black or bloody stools Genito-Urinary ROS: no dysuria, trouble voiding, or hematuria Musculoskeletal ROS: negative Neurological ROS: no TIA or stroke symptoms Dermatological ROS: negative  Physical Examination  Filed Vitals:   12/21/13 2057 12/21/13 2151  BP: 147/94 100/60  Pulse: 108 60    Temp: 98 F (36.7 C) 97.6 F (36.4 C)  TempSrc: Oral Oral  Resp: 20   Height: '5\' 8"'  (1.727 m)   Weight: 88.905 kg (196 lb)   SpO2: 95% 95%    BP 100/60  Pulse 60  Temp(Src) 97.6 F (36.4 C) (Oral)  Resp 20  Ht '5\' 8"'  (1.727 m)  Wt 88.905 kg (196 lb)  BMI 29.81 kg/m2  SpO2 95%  General appearance: alert, cooperative, appears stated age and no distress Head: Normocephalic, without obvious abnormality, atraumatic Eyes: conjunctivae/corneas clear. PERRL, EOM's intact. Throat: lips, mucosa, and tongue normal; teeth and gums normal Neck: no adenopathy, no carotid bruit, no JVD, supple, symmetrical, trachea midline and thyroid not enlarged, symmetric, no tenderness/mass/nodules Back: symmetric, no curvature. ROM normal. No CVA tenderness. Resp: Chest crackles bilateral bases, without any wheezing or rhonchi Cardio: S1-S2 is irregularly irregular. No S3, S4. No rubs, murmurs 3.  GI: soft, non-tender; bowel sounds normal; no masses,  no organomegaly Extremities: Right leg is larger than left. Mild pitting is noted. No calf tenderness. No erythema Pulses: 2+ and symmetric Skin: Skin color, texture, turgor normal. No rashes or lesions Lymph nodes: Cervical, supraclavicular, and axillary nodes normal. Neurologic: She is alert and oriented x3. No focal neurological deficits are present.  Laboratory Data: Results for orders placed during the hospital encounter of 12/21/13 (from the past 48 hour(s))  URINALYSIS, ROUTINE W REFLEX MICROSCOPIC     Status: Abnormal   Collection Time    12/21/13  7:37 PM      Result Value Ref Range   Color, Urine YELLOW  YELLOW   APPearance CLEAR  CLEAR   Specific Gravity, Urine 1.025  1.005 - 1.030   pH 5.5  5.0 - 8.0   Glucose, UA NEGATIVE  NEGATIVE mg/dL   Hgb urine dipstick NEGATIVE  NEGATIVE   Bilirubin Urine NEGATIVE  NEGATIVE   Ketones, ur TRACE (*) NEGATIVE mg/dL   Protein, ur NEGATIVE  NEGATIVE mg/dL   Urobilinogen, UA 4.0 (*) 0.0 - 1.0 mg/dL    Nitrite NEGATIVE  NEGATIVE   Leukocytes, UA NEGATIVE  NEGATIVE   Comment: MICROSCOPIC NOT DONE ON URINES WITH NEGATIVE PROTEIN, BLOOD, LEUKOCYTES, NITRITE, OR GLUCOSE <1000 mg/dL.  CBC WITH DIFFERENTIAL     Status: Abnormal  Collection Time    12/21/13  9:12 PM      Result Value Ref Range   WBC 8.7  4.0 - 10.5 K/uL   RBC 3.92  3.87 - 5.11 MIL/uL   Hemoglobin 11.1 (*) 12.0 - 15.0 g/dL   HCT 34.7 (*) 36.0 - 46.0 %   MCV 88.5  78.0 - 100.0 fL   MCH 28.3  26.0 - 34.0 pg   MCHC 32.0  30.0 - 36.0 g/dL   RDW 15.1  11.5 - 15.5 %   Platelets 206  150 - 400 K/uL   Neutrophils Relative % 64  43 - 77 %   Neutro Abs 5.5  1.7 - 7.7 K/uL   Lymphocytes Relative 21  12 - 46 %   Lymphs Abs 1.9  0.7 - 4.0 K/uL   Monocytes Relative 15 (*) 3 - 12 %   Monocytes Absolute 1.3 (*) 0.1 - 1.0 K/uL   Eosinophils Relative 0  0 - 5 %   Eosinophils Absolute 0.0  0.0 - 0.7 K/uL   Basophils Relative 0  0 - 1 %   Basophils Absolute 0.0  0.0 - 0.1 K/uL  COMPREHENSIVE METABOLIC PANEL     Status: Abnormal   Collection Time    12/21/13  9:12 PM      Result Value Ref Range   Sodium 139  137 - 147 mEq/L   Potassium 4.9  3.7 - 5.3 mEq/L   Comment: DELTA CHECK NOTED   Chloride 102  96 - 112 mEq/L   CO2 25  19 - 32 mEq/L   Glucose, Bld 204 (*) 70 - 99 mg/dL   BUN 11  6 - 23 mg/dL   Creatinine, Ser 0.53  0.50 - 1.10 mg/dL   Calcium 9.2  8.4 - 10.5 mg/dL   Total Protein 6.7  6.0 - 8.3 g/dL   Albumin 3.3 (*) 3.5 - 5.2 g/dL   AST 17  0 - 37 U/L   ALT 10  0 - 35 U/L   Alkaline Phosphatase 75  39 - 117 U/L   Total Bilirubin 0.7  0.3 - 1.2 mg/dL   GFR calc non Af Amer >90  >90 mL/min   GFR calc Af Amer >90  >90 mL/min   Comment: (NOTE)     The eGFR has been calculated using the CKD EPI equation.     This calculation has not been validated in all clinical situations.     eGFR's persistently <90 mL/min signify possible Chronic Kidney     Disease.  PRO B NATRIURETIC PEPTIDE     Status: Abnormal   Collection Time     12/21/13  9:12 PM      Result Value Ref Range   Pro B Natriuretic peptide (BNP) 7746.0 (*) 0 - 125 pg/mL  TROPONIN I     Status: None   Collection Time    12/21/13  9:12 PM      Result Value Ref Range   Troponin I <0.30  <0.30 ng/mL   Comment:            Due to the release kinetics of cTnI,     a negative result within the first hours     of the onset of symptoms does not rule out     myocardial infarction with certainty.     If myocardial infarction is still suspected,     repeat the test at appropriate intervals.  D-DIMER, QUANTITATIVE     Status:  None   Collection Time    12/21/13  9:12 PM      Result Value Ref Range   D-Dimer, Quant <0.27  0.00 - 0.48 ug/mL-FEU   Comment:            AT THE INHOUSE ESTABLISHED CUTOFF     VALUE OF 0.48 ug/mL FEU,     THIS ASSAY HAS BEEN DOCUMENTED     IN THE LITERATURE TO HAVE     A SENSITIVITY AND NEGATIVE     PREDICTIVE VALUE OF AT LEAST     98 TO 99%.  THE TEST RESULT     SHOULD BE CORRELATED WITH     AN ASSESSMENT OF THE CLINICAL     PROBABILITY OF DVT / VTE.    Radiology Reports: Ct Chest W Contrast  12/20/2013   CLINICAL DATA:  63 year old female with chest pain. Shortness of Breath. Initial encounter.  EXAM: CT CHEST WITH CONTRAST  TECHNIQUE: Multidetector CT imaging of the chest was performed during intravenous contrast administration.  CONTRAST:  28m OMNIPAQUE IOHEXOL 300 MG/ML  SOLN  COMPARISON:  Portable chest radiograph 12/19/2013 and earlier. Chest CT 10/13/2013 and earlier.  FINDINGS: Intermittently degraded by respiratory motion artifact. Continued low lung volumes. Interval decreased pulmonary septal thickening and peribronchovascular ground-glass opacity. Retrocardiac and right lung base platelike atelectasis and/or scarring not significantly changed. Major airways are patent except for atelectatic changes. No pulmonary consolidation.  Increased extrapleural fat bilaterally.  Trace if any pleural fluid.  Negative thoracic  inlet. Cardiomegaly. No pericardial effusion. Central pulmonary artery enlargement is stable. Grossly patent central pulmonary arteries. Stable thoracic aorta. No mediastinal lymphadenopathy. No axillary lymphadenopathy.  Stable visualized upper abdominal viscera. The stomach is more distended with fluid today.  Stable visualized osseous structures.  IMPRESSION: 1. Severe cardiomegaly and evidence of chronic pulmonary artery hypertension. 2. Regressed/resolved pulmonary interstitial edema compared to 10/13/2013. Stable lung base atelectasis and/or scarring. 3. Trace if any pleural fluid.  No pericardial effusion.   Electronically Signed   By: LLars PinksM.D.   On: 12/20/2013 11:12   Dg Chest Portable 1 View  12/21/2013   CLINICAL DATA:  Shortness of breath and weakness  EXAM: PORTABLE CHEST - 1 VIEW  COMPARISON:  12/19/2013 and prior chest radiographs  FINDINGS: Cardiomegaly and pulmonary vascular congestion noted. There may be mild interstitial pulmonary edema present.  Left basilar atelectasis again noted.  There is no evidence of pleural effusion or pneumothorax.  No acute bony abnormalities are present.  IMPRESSION: Cardiomegaly with pulmonary vascular congestion and possible mild interstitial pulmonary edema.  Continued left lower lung atelectasis.   Electronically Signed   By: JHassan RowanM.D.   On: 12/21/2013 21:53    Electrocardiogram: Atrial fibrillation at 99 beats per minute. left axis deviation. Nonspecific T wave, changes. No concerning ST changes. Normal intervals.  Problem List  Principal Problem:   Acute CHF Active Problems:   HYPOTHYROIDISM, PRIMARY   DIABETES MELLITUS, TYPE II, ON INSULIN   HYPERTENSION   Atrial fibrillation with RVR   Assessment: This is a 63year old, Caucasian female, presents with shortness of breath. Chest x-ray is suggestive off heart failure as is the physical examination. She had an echocardiogram done just recently, which showed normal systolic function.  Diastolic function could not be assessed. She likely has some degree of diastolic heart failure. Troponin is normal. Atrial fibrillation is poorly controlled. Venous thromboembolism is unlikely considering normal D-dimer.  Plan: #1 acute the congestive heart failure, possibly diastolic:  We will give her intravenous Lasix. Monitor strict ins and outs. Check daily weights. Check troponin in AM. Continue with anticoagulation. Monitor electrolytes. Continue oxygen as she was borderline hypoxic initially.  #2 atrial fibrillation with RVR: Heart rate is not well controlled. Daughter tells me that she took her medications today. We'll give an additional dose of metoprolol. Monitor her on telemetry. Continue with anticoagulation. Continue with her Cardizem as well.  #3 diabetes mellitus, type II: Continue with Lantus. Sliding scale coverage will be initiated as well. We'll hold her oral agents for now.  #4 leg swelling: Right appears to be more swollen than the left. However, there is no erythema. No calf tenderness. The daughter tells that the right leg is usually more swollen due to previous fracture. In any, case we'll get a venous Doppler.  #5 history of hypothyroidism: Continue with levothyroxine.  #6 history of seizure disorder: Continue with the Keppra.   DVT Prophylaxis: She is on anticoagulation Code Status: Full code Family Communication: Discussed with the patient and her daughter  Disposition Plan: Observe to telemetry. Dr. Anastasio Champion to assume care in the morning.   Further management decisions will depend on results of further testing and patient's response to treatment.   [facility]  [facility] Pager [phone]  If 7PM-7AM, please contact night-coverage [email or web address] Password [facility]  12/21/2013, 11:54 PM  Disclaimer: This note was dictated with voice recognition software. Similar sounding words can inadvertently be transcribed and may not be corrected upon review.

## 2013-12-21 NOTE — ED Notes (Addendum)
Was recently hospitalized here for chest pressure and diagnosed w/costochondritis. Discharged at 1100 today.  Has had SOB all day, worsening around 1600.  Denies fevers, chills, n/v/d.  Nonproductive cough.

## 2013-12-21 NOTE — Progress Notes (Signed)
Pt and her daughter verbalize understanding of d/c instructions, medications, follow up appts, and when to call MD. Pt was encouraged to check her weight daily and she understands and states she will do this. Pts pain was significantly improved on d/c. IV was d/c this morning. Pt has no questions at this time. Pt d/c via wheelchair accompanied by NT. Sheryn Bison

## 2013-12-22 ENCOUNTER — Observation Stay (HOSPITAL_COMMUNITY): Payer: Medicare Other

## 2013-12-22 LAB — GLUCOSE, CAPILLARY
GLUCOSE-CAPILLARY: 223 mg/dL — AB (ref 70–99)
Glucose-Capillary: 107 mg/dL — ABNORMAL HIGH (ref 70–99)
Glucose-Capillary: 198 mg/dL — ABNORMAL HIGH (ref 70–99)

## 2013-12-22 LAB — BASIC METABOLIC PANEL
BUN: 9 mg/dL (ref 6–23)
CALCIUM: 8.9 mg/dL (ref 8.4–10.5)
CO2: 29 mEq/L (ref 19–32)
Chloride: 104 mEq/L (ref 96–112)
Creatinine, Ser: 0.53 mg/dL (ref 0.50–1.10)
GFR calc Af Amer: >90 mL/min (ref >90)
GFR calc non Af Amer: >90 mL/min (ref >90)
Glucose, Bld: 120 mg/dL — ABNORMAL HIGH (ref 70–99)
POTASSIUM: 4 meq/L (ref 3.7–5.3)
Sodium: 144 mEq/L (ref 137–147)

## 2013-12-22 LAB — TROPONIN I

## 2013-12-22 MED ORDER — LORAZEPAM 0.5 MG PO TABS: 0.5000 mg | ORAL_TABLET | Freq: Two times a day (BID) | ORAL | Status: DC | PRN

## 2013-12-22 MED ORDER — METOPROLOL TARTRATE 25 MG PO TABS
25.0000 mg | ORAL_TABLET | Freq: Once | ORAL | Status: DC
  Filled 2013-12-22: qty 1

## 2013-12-22 MED ORDER — LEVETIRACETAM 500 MG PO TABS
250.0000 mg | ORAL_TABLET | Freq: Two times a day (BID) | ORAL | Status: DC
  Administered 2013-12-22: 250 mg via ORAL
  Filled 2013-12-22 (×2): qty 1

## 2013-12-22 MED ORDER — SIMVASTATIN 20 MG PO TABS
40.0000 mg | ORAL_TABLET | Freq: Every day | ORAL | Status: DC
  Filled 2013-12-22: qty 4

## 2013-12-22 MED ORDER — INSULIN ASPART 100 UNIT/ML ~~LOC~~ SOLN
0.0000 [IU] | Freq: Every day | SUBCUTANEOUS | Status: DC
Start: 2013-12-22 — End: 2013-12-22
  Administered 2013-12-22: 2 [IU] via SUBCUTANEOUS

## 2013-12-22 MED ORDER — FUROSEMIDE 10 MG/ML IJ SOLN
40.0000 mg | Freq: Two times a day (BID) | INTRAMUSCULAR | Status: DC
  Administered 2013-12-22: 40 mg via INTRAVENOUS
  Filled 2013-12-22: qty 4

## 2013-12-22 MED ORDER — METOCLOPRAMIDE HCL 10 MG PO TABS
10.0000 mg | ORAL_TABLET | Freq: Three times a day (TID) | ORAL | Status: DC
  Administered 2013-12-22: 10 mg via ORAL
  Filled 2013-12-22: qty 1

## 2013-12-22 MED ORDER — INSULIN ASPART 100 UNIT/ML ~~LOC~~ SOLN
0.0000 [IU] | Freq: Three times a day (TID) | SUBCUTANEOUS | Status: DC
  Administered 2013-12-22: 3 [IU] via SUBCUTANEOUS

## 2013-12-22 MED ORDER — BACLOFEN 10 MG PO TABS
10.0000 mg | ORAL_TABLET | Freq: Two times a day (BID) | ORAL | Status: DC
  Administered 2013-12-22: 10 mg via ORAL
  Filled 2013-12-22: qty 1

## 2013-12-22 MED ORDER — LEVOTHYROXINE SODIUM 25 MCG PO TABS
125.0000 ug | ORAL_TABLET | Freq: Every day | ORAL | Status: DC
  Administered 2013-12-22: 125 ug via ORAL
  Filled 2013-12-22 (×2): qty 1

## 2013-12-22 MED ORDER — SODIUM CHLORIDE 0.9 % IV SOLN: 250.0000 mL | INTRAVENOUS | Status: DC | PRN

## 2013-12-22 MED ORDER — INSULIN GLARGINE 100 UNIT/ML ~~LOC~~ SOLN
22.0000 [IU] | Freq: Every day | SUBCUTANEOUS | Status: DC
  Administered 2013-12-22: 22 [IU] via SUBCUTANEOUS
  Filled 2013-12-22 (×2): qty 0.22

## 2013-12-22 MED ORDER — DILTIAZEM HCL ER COATED BEADS 240 MG PO CP24: 240.0000 mg | ORAL_CAPSULE | Freq: Every evening | ORAL | Status: DC

## 2013-12-22 MED ORDER — APIXABAN 5 MG PO TABS
5.0000 mg | ORAL_TABLET | Freq: Two times a day (BID) | ORAL | Status: DC
  Administered 2013-12-22: 5 mg via ORAL
  Filled 2013-12-22: qty 1

## 2013-12-22 MED ORDER — HYDROCODONE-ACETAMINOPHEN 10-325 MG PO TABS: 1.0000 | ORAL_TABLET | Freq: Two times a day (BID) | ORAL | Status: DC | PRN

## 2013-12-22 MED ORDER — METOPROLOL TARTRATE 50 MG PO TABS
100.0000 mg | ORAL_TABLET | Freq: Two times a day (BID) | ORAL | Status: DC
  Administered 2013-12-22: 100 mg via ORAL
  Filled 2013-12-22: qty 2

## 2013-12-22 MED ORDER — BIOTENE DRY MOUTH MT LIQD
15.0000 mL | Freq: Two times a day (BID) | OROMUCOSAL | Status: DC
  Administered 2013-12-22: 15 mL via OROMUCOSAL

## 2013-12-22 MED ORDER — SODIUM CHLORIDE 0.9 % IJ SOLN: 3.0000 mL | INTRAMUSCULAR | Status: DC | PRN

## 2013-12-22 MED ORDER — SODIUM CHLORIDE 0.9 % IJ SOLN
3.0000 mL | Freq: Two times a day (BID) | INTRAMUSCULAR | Status: DC
  Administered 2013-12-22 (×2): 3 mL via INTRAVENOUS

## 2013-12-22 MED ORDER — PANTOPRAZOLE SODIUM 40 MG PO TBEC
40.0000 mg | DELAYED_RELEASE_TABLET | Freq: Every day | ORAL | Status: DC
  Administered 2013-12-22: 40 mg via ORAL
  Filled 2013-12-22: qty 1

## 2013-12-22 NOTE — Progress Notes (Addendum)
Pt was d/c at 1230. Pt and her daughter verbalize understanding of d/c instructions, medications, follow up appts, and when to call MD/seek medical attention. Pt verbalizes understanding of HR education-weight gain, swelling, difficulty breathing as signs to seek care.  Importance of taking prescribed medications was reinforced multiple times. IV was d/c without complications. Pts daughter is driving her home. Belongings policy reviewed with pt and her daughter and encouraged them to check room thoroughly for belongings at time of d/c. O2 was removed, pt ambulated in the hallway and her oxygen level was 94 % on room air. Pt has urinated since foley was d/c approximately 30 min ago. Sheryn Bison

## 2013-12-22 NOTE — Discharge Summary (Signed)
Physician Discharge Summary  Patient ID: [name] MRN: [id number] DOB/AGE: [birth year] 63 y.o. Primary Care Physician:KNOWLTON,STEPHEN D, MD Admit date: 12/21/2013 Discharge date: 12/22/2013    Discharge Diagnoses:  1. Acute on chronic congestive heart failure, resolved. 2. Chronic atrial fibrillation on anticoagulation. 3. Hypertension. 4. Type 2 diabetes mellitus. 5. Obesity.     Medication List         apixaban 5 MG Tabs tablet  Commonly known as:  ELIQUIS  Take 1 tablet (5 mg total) by mouth 2 (two) times daily.     baclofen 10 MG tablet  Commonly known as:  LIORESAL  Take 10 mg by mouth 2 (two) times daily.     diltiazem 240 MG 24 hr capsule  Commonly known as:  CARDIZEM CD  Take 240 mg by mouth every evening.     furosemide 40 MG tablet  Commonly known as:  LASIX  Take 40 mg by mouth daily.     glipiZIDE 10 MG tablet  Commonly known as:  GLUCOTROL  Take 10 mg by mouth 2 (two) times daily.     HYDROcodone-acetaminophen 10-325 MG per tablet  Commonly known as:  NORCO  Take 1 tablet by mouth 2 (two) times daily as needed for moderate pain or severe pain.     LANTUS SOLOSTAR 100 UNIT/ML Solostar Pen  Generic drug:  Insulin Glargine  Inject 22 Units into the skin at bedtime.     levETIRAcetam 250 MG tablet  Commonly known as:  KEPPRA  Take 1 tablet (250 mg total) by mouth 2 (two) times daily.     levothyroxine 125 MCG tablet  Commonly known as:  SYNTHROID, LEVOTHROID  Take 125 mcg by mouth daily.     loperamide 2 MG capsule  Commonly known as:  IMODIUM  Take 4 mg by mouth as needed for diarrhea or loose stools.     LORazepam 1 MG tablet  Commonly known as:  ATIVAN  Take 1-2 mg by mouth daily as needed for anxiety or sleep. For anxiety     metFORMIN 500 MG tablet  Commonly known as:  GLUCOPHAGE  Take 1,000 mg by mouth 2 (two) times daily with a meal.     metoCLOPramide 10 MG tablet  Commonly known as:  REGLAN  Take 10 mg by mouth 4 (four) times  daily.     metoprolol 100 MG tablet  Commonly known as:  LOPRESSOR  Take 100 mg by mouth 2 (two) times daily.     nitroGLYCERIN 0.4 MG SL tablet  Commonly known as:  NITROSTAT  - Place 0.4 mg under the tongue every 5 (five) minutes as needed for chest pain. For chest pain  -      omeprazole 20 MG capsule  Commonly known as:  PRILOSEC  Take 20 mg by mouth 2 (two) times daily.     ondansetron 8 MG tablet  Commonly known as:  ZOFRAN  Take 8 mg by mouth daily as needed. nausea     potassium chloride SA 20 MEQ tablet  Commonly known as:  K-DUR,KLOR-CON  Take 20 mEq by mouth 3 (three) times daily.     simvastatin 40 MG tablet  Commonly known as:  ZOCOR  Take 40 mg by mouth at bedtime.        Discharged Condition: Stable and improved.    Consults: None.  Significant Diagnostic Studies: Dg Chest 2 View  12/03/2013   CLINICAL DATA:  Chest pain, CHF  EXAM: CHEST  2  VIEW  COMPARISON:  CT chest dated 10/13/2013  FINDINGS: No frank interstitial edema. Elevation of the left hemidiaphragm with possible small left pleural effusion. No pneumothorax.  Stable cardiomegaly.  Degenerative changes of the visualized thoracolumbar spine.  IMPRESSION: No frank interstitial edema.  Possible small left pleural effusion.   Electronically Signed   By: Charline Bills M.D.   On: 12/03/2013 18:28   Ct Chest W Contrast  12/20/2013   CLINICAL DATA:  63 year old female with chest pain. Shortness of Breath. Initial encounter.  EXAM: CT CHEST WITH CONTRAST  TECHNIQUE: Multidetector CT imaging of the chest was performed during intravenous contrast administration.  CONTRAST:  7mL OMNIPAQUE IOHEXOL 300 MG/ML  SOLN  COMPARISON:  Portable chest radiograph 12/19/2013 and earlier. Chest CT 10/13/2013 and earlier.  FINDINGS: Intermittently degraded by respiratory motion artifact. Continued low lung volumes. Interval decreased pulmonary septal thickening and peribronchovascular ground-glass opacity. Retrocardiac and  right lung base platelike atelectasis and/or scarring not significantly changed. Major airways are patent except for atelectatic changes. No pulmonary consolidation.  Increased extrapleural fat bilaterally.  Trace if any pleural fluid.  Negative thoracic inlet. Cardiomegaly. No pericardial effusion. Central pulmonary artery enlargement is stable. Grossly patent central pulmonary arteries. Stable thoracic aorta. No mediastinal lymphadenopathy. No axillary lymphadenopathy.  Stable visualized upper abdominal viscera. The stomach is more distended with fluid today.  Stable visualized osseous structures.  IMPRESSION: 1. Severe cardiomegaly and evidence of chronic pulmonary artery hypertension. 2. Regressed/resolved pulmonary interstitial edema compared to 10/13/2013. Stable lung base atelectasis and/or scarring. 3. Trace if any pleural fluid.  No pericardial effusion.   Electronically Signed   By: Augusto Gamble M.D.   On: 12/20/2013 11:12   Dg Chest Portable 1 View  12/21/2013   CLINICAL DATA:  Shortness of breath and weakness  EXAM: PORTABLE CHEST - 1 VIEW  COMPARISON:  12/19/2013 and prior chest radiographs  FINDINGS: Cardiomegaly and pulmonary vascular congestion noted. There may be mild interstitial pulmonary edema present.  Left basilar atelectasis again noted.  There is no evidence of pleural effusion or pneumothorax.  No acute bony abnormalities are present.  IMPRESSION: Cardiomegaly with pulmonary vascular congestion and possible mild interstitial pulmonary edema.  Continued left lower lung atelectasis.   Electronically Signed   By: Laveda Abbe M.D.   On: 12/21/2013 21:53   Dg Chest Port 1 View  12/19/2013   CLINICAL DATA:  Left-sided chest pain and shortness of breath since 6:30 p.m. History of CHF, hypertension, diabetes.  EXAM: PORTABLE CHEST - 1 VIEW  COMPARISON:  12/03/2013  FINDINGS: The heart is enlarged. There is left lung base density which partially obscures the hemidiaphragm. Findings are consistent  with atelectasis or mild infiltrate. Aeration appears improved since exams earlier in the year. There is mild chronic bronchitic change. No overt edema.  IMPRESSION: 1. Cardiomegaly, stable. 2. Persistent but improved density in the left lung base.   Electronically Signed   By: Rosalie Gums M.D.   On: 12/19/2013 20:39    Lab Results: Basic Metabolic Panel:  Recent Labs  07/05/70 2112 12/22/13 0508  NA 139 144  K 4.9 4.0  CL 102 104  CO2 25 29  GLUCOSE 204* 120*  BUN 11 9  CREATININE 0.53 0.53  CALCIUM 9.2 8.9   Liver Function Tests:  Recent Labs  12/20/13 0417 12/21/13 2112  AST 12 17  ALT 8 10  ALKPHOS 64 75  BILITOT 0.3 0.7  PROT 6.3 6.7  ALBUMIN 3.1* 3.3*  CBC:  Recent Labs  12/19/13 2009 12/20/13 0417 12/21/13 2112  WBC 4.8 6.2 8.7  NEUTROABS 2.1  --  5.5  HGB 10.7* 11.0* 11.1*  HCT 33.9* 35.3* 34.7*  MCV 89.7 91.2 88.5  PLT 207 221 206    Recent Results (from the past 240 hour(s))  MRSA PCR SCREENING     Status: None   Collection Time    12/19/13 11:45 PM      Result Value Ref Range Status   MRSA by PCR NEGATIVE  NEGATIVE Final   Comment:            The GeneXpert MRSA Assay (FDA     approved for NASAL specimens     only), is one component of a     comprehensive MRSA colonization     surveillance program. It is not     intended to diagnose MRSA     infection nor to guide or     monitor treatment for     MRSA infections.     Hospital Course: This lady was admitted once again yesterday with dyspnea. She was in congestive heart failure. She has been given intravenous Lasix yesterday night and feels completely back to normal. Her breathing is now back to normal. I have instructed her on her previous discharge that she should take Lasix every day. I've emphasizes today. She does not have any chest pain as she does not previous admission. She stable for discharge and will followup in the office.  Discharge Exam: Blood pressure 145/91, pulse 87,  temperature 98.6 F (37 C), temperature source Oral, resp. rate 20, height 5\' 8"  (1.727 m), weight 88.905 kg (196 lb), SpO2 97.00%. She looks systemically well. Lung fields are entirely clear. Heart sounds are in atrial fibrillation but ventricular rate is no increased. She is alert and oriented.  Disposition: Home.      Discharge Instructions   Diet - low sodium heart healthy    Complete by:  As directed      Increase activity slowly    Complete by:  As directed              Signed: GOSRANI,NIMISH C   12/22/2013, 8:19 AM

## 2013-12-25 ENCOUNTER — Ambulatory Visit (INDEPENDENT_AMBULATORY_CARE_PROVIDER_SITE_OTHER): Payer: Medicare Other | Admitting: Adult Health

## 2013-12-25 ENCOUNTER — Encounter: Payer: Self-pay | Admitting: Adult Health

## 2013-12-25 ENCOUNTER — Encounter: Payer: Self-pay | Admitting: *Deleted

## 2013-12-25 VITALS — BP 100/74 | HR 48 | Ht 68.0 in | Wt 187.0 lb

## 2013-12-25 DIAGNOSIS — R0609 Other forms of dyspnea: Secondary | ICD-10-CM

## 2013-12-25 DIAGNOSIS — Z01812 Encounter for preprocedural laboratory examination: Secondary | ICD-10-CM

## 2013-12-25 DIAGNOSIS — I482 Chronic atrial fibrillation, unspecified: Secondary | ICD-10-CM

## 2013-12-25 DIAGNOSIS — R0989 Other specified symptoms and signs involving the circulatory and respiratory systems: Secondary | ICD-10-CM

## 2013-12-25 DIAGNOSIS — I272 Pulmonary hypertension, unspecified: Secondary | ICD-10-CM

## 2013-12-25 DIAGNOSIS — I509 Heart failure, unspecified: Secondary | ICD-10-CM

## 2013-12-25 DIAGNOSIS — R0600 Dyspnea, unspecified: Secondary | ICD-10-CM

## 2013-12-25 DIAGNOSIS — I4891 Unspecified atrial fibrillation: Secondary | ICD-10-CM

## 2013-12-25 DIAGNOSIS — I2789 Other specified pulmonary heart diseases: Secondary | ICD-10-CM

## 2013-12-25 NOTE — Patient Instructions (Addendum)
Your physician recommends that you schedule a follow-up appointment in: Post procedure with Dr Wyline Mood  Your physician has requested that you have a cardiac catheterization. Cardiac catheterization is used to diagnose and/or treat various heart conditions. Doctors may recommend this procedure for a number of different reasons. The most common reason is to evaluate chest pain. Chest pain can be a symptom of coronary artery disease (CAD), and cardiac catheterization can show whether plaque is narrowing or blocking your heart's arteries. This procedure is also used to evaluate the valves, as well as measure the blood flow and oxygen levels in different parts of your heart. For further information please visit [email or web address] Please follow instruction sheet, as given.  January 10, 2014 at 10:00 am with Dr Katrinka Blazing

## 2013-12-25 NOTE — Assessment & Plan Note (Signed)
Heart rate is currently controlled at 48 beats per minute. She denies any chest discomfort rapid heart rhythms or palpitations. She is tolerating Eliquis 5 mg twice a day. She continues on metoprolol 100 mg twice a day along with diltiazem 240 milligrams daily.  She is planned for right heart catheterization on July 10 with Dr. Katrinka Blazing. She will hold Eliquis 24 hours prior to catheterization and restart day of catheterization at Dr. Michaelle Copas discretion

## 2013-12-25 NOTE — Progress Notes (Deleted)
Name: [name]    DOB: [birth year]  Age: 63 y.o.  MR#: [id number]       PCP:  [name], MD      Insurance: Payor: [employer] MEDICARE / Plan: AARP MEDICARE COMPLETE / Product Type: *No Product type* /   CC:    Chief Complaint  Patient presents with  . Congestive Heart Failure  . Atrial Fibrillation  . Hypertension    VS Filed Vitals:   12/25/13 1425  BP: 100/74  Pulse: 48  Height: 5\' 8"  (1.727 m)  Weight: 187 lb (84.823 kg)    Weights Current Weight  12/25/13 187 lb (84.823 kg)  12/21/13 196 lb (88.905 kg)  12/19/13 196 lb (88.905 kg)    Blood Pressure  BP Readings from Last 3 Encounters:  12/25/13 100/74  12/22/13 145/91  12/21/13 131/82     Admit date:  (Not on file) Last encounter with RMR:  07/29/2013   Allergy Ibuprofen; Celexa; and Codeine  Current Outpatient Prescriptions  Medication Sig Dispense Refill  . apixaban (ELIQUIS) 5 MG TABS tablet Take 1 tablet (5 mg total) by mouth 2 (two) times daily.  60 tablet  6  . baclofen (LIORESAL) 10 MG tablet Take 10 mg by mouth 2 (two) times daily.      Marland Kitchen diltiazem (CARDIZEM CD) 240 MG 24 hr capsule Take 240 mg by mouth every evening.      . furosemide (LASIX) 40 MG tablet Take 40 mg by mouth daily.      Marland Kitchen glipiZIDE (GLUCOTROL) 10 MG tablet Take 10 mg by mouth 2 (two) times daily.       Marland Kitchen HYDROcodone-acetaminophen (NORCO) 10-325 MG per tablet Take 1 tablet by mouth 2 (two) times daily as needed for moderate pain or severe pain.       . Insulin Glargine (LANTUS SOLOSTAR) 100 UNIT/ML Solostar Pen Inject 22 Units into the skin at bedtime.       . levETIRAcetam (KEPPRA) 250 MG tablet Take 1 tablet (250 mg total) by mouth 2 (two) times daily.  60 tablet  11  . levothyroxine (SYNTHROID, LEVOTHROID) 125 MCG tablet Take 125 mcg by mouth daily.      Marland Kitchen loperamide (IMODIUM) 2 MG capsule Take 4 mg by mouth as needed for diarrhea or loose stools.       Marland Kitchen LORazepam (ATIVAN) 1 MG tablet Take 1-2 mg by mouth daily as  needed for anxiety or sleep. For anxiety      . metFORMIN (GLUCOPHAGE) 500 MG tablet Take 1,000 mg by mouth 2 (two) times daily with a meal.       . metoCLOPramide (REGLAN) 10 MG tablet Take 10 mg by mouth 4 (four) times daily.       . metoprolol (LOPRESSOR) 100 MG tablet Take 100 mg by mouth 2 (two) times daily.      . nitroGLYCERIN (NITROSTAT) 0.4 MG SL tablet Place 0.4 mg under the tongue every 5 (five) minutes as needed for chest pain. For chest pain       . omeprazole (PRILOSEC) 20 MG capsule Take 20 mg by mouth 2 (two) times daily.        . ondansetron (ZOFRAN) 8 MG tablet Take 8 mg by mouth daily as needed. nausea      . potassium chloride SA (K-DUR,KLOR-CON) 20 MEQ tablet Take 20 mEq by mouth 3 (three) times daily.       . simvastatin (ZOCOR) 40 MG tablet Take 40 mg by mouth  at bedtime.        No current facility-administered medications for this visit.    Discontinued Meds:   There are no discontinued medications.  Patient Active Problem List   Diagnosis Date Noted  . Acute CHF 12/21/2013  . Atrial fibrillation with RVR 12/21/2013  . Chest pain at rest 12/03/2013  . Obesity 12/03/2013  . Chest pain 12/03/2013  . Diarrhea 08/26/2013  . Abnormal weight loss 08/26/2013  . Lower abdominal pain 08/26/2013  . Anorexia nervosa 08/26/2013  . Encounter for therapeutic drug monitoring 08/26/2013  . Dyspnea 06/29/2013  . Tremor 10/27/2012  . Chronic anticoagulation 06/23/2011  . HYPOTENSION, ORTHOSTATIC 08/26/2009  . Hyperlipidemia 03/04/2009  . Barrett's esophagus 09/26/2008  . Gastroparesis 09/26/2008  . ANEMIA, IRON DEFICIENCY 09/25/2008  . HYPERTENSION 09/25/2008  . LUNG NODULE 09/25/2008  . Irritable bowel syndrome 09/25/2008  . SLEEP APNEA 09/25/2008  . CARPAL TUNNEL SYNDROME, HX OF 09/25/2008  . HYPOTHYROIDISM, PRIMARY 04/10/2008  . DIABETES MELLITUS, TYPE II, ON INSULIN 04/10/2008  . OBESITY 04/10/2008  . Atrial fibrillation 04/10/2008  . CONGESTIVE HEART FAILURE,  MILD 04/10/2008  . LOW BACK PAIN, CHRONIC 04/10/2008  . CHEST PAIN-PRECORDIAL 04/10/2008    LABS    Component Value Date/Time   NA 144 12/22/2013 0508   NA 139 12/21/2013 2112   NA 144 12/20/2013 0417   K 4.0 12/22/2013 0508   K 4.9 12/21/2013 2112   K 3.9 12/20/2013 0417   CL 104 12/22/2013 0508   CL 102 12/21/2013 2112   CL 107 12/20/2013 0417   CO2 29 12/22/2013 0508   CO2 25 12/21/2013 2112   CO2 26 12/20/2013 0417   GLUCOSE 120* 12/22/2013 0508   GLUCOSE 204* 12/21/2013 2112   GLUCOSE 122* 12/20/2013 0417   BUN 9 12/22/2013 0508   BUN 11 12/21/2013 2112   BUN 11 12/20/2013 0417   CREATININE 0.53 12/22/2013 0508   CREATININE 0.53 12/21/2013 2112   CREATININE 0.57 12/20/2013 0417   CREATININE 0.78 08/26/2013 1031   CREATININE 0.80 08/26/2013 0921   CREATININE 0.80 04/03/2013 1244   CALCIUM 8.9 12/22/2013 0508   CALCIUM 9.2 12/21/2013 2112   CALCIUM 8.8 12/20/2013 0417   CALCIUM 8.4 06/30/2013 0318   GFRNONAA >90 12/22/2013 0508   GFRNONAA >90 12/21/2013 2112   GFRNONAA >90 12/20/2013 0417   GFRNONAA 78 08/26/2013 0921   GFRAA >90 12/22/2013 0508   GFRAA >90 12/21/2013 2112   GFRAA >90 12/20/2013 0417   GFRAA >89 08/26/2013 0921   CMP     Component Value Date/Time   NA 144 12/22/2013 0508   K 4.0 12/22/2013 0508   CL 104 12/22/2013 0508   CO2 29 12/22/2013 0508   GLUCOSE 120* 12/22/2013 0508   BUN 9 12/22/2013 0508   CREATININE 0.53 12/22/2013 0508   CREATININE 0.78 08/26/2013 1031   CALCIUM 8.9 12/22/2013 0508   CALCIUM 8.4 06/30/2013 0318   PROT 6.7 12/21/2013 2112   ALBUMIN 3.3* 12/21/2013 2112   AST 17 12/21/2013 2112   ALT 10 12/21/2013 2112   ALKPHOS 75 12/21/2013 2112   BILITOT 0.7 12/21/2013 2112   GFRNONAA >90 12/22/2013 0508   GFRNONAA 78 08/26/2013 0921   GFRAA >90 12/22/2013 0508   GFRAA >89 08/26/2013 0921       Component Value Date/Time   WBC 8.7 12/21/2013 2112   WBC 6.2 12/20/2013 0417   WBC 4.8 12/19/2013 2009   HGB 11.1* 12/21/2013 2112   HGB 11.0* 12/20/2013 0417  HGB 10.7*  12/19/2013 2009   HCT 34.7* 12/21/2013 2112   HCT 35.3* 12/20/2013 0417   HCT 33.9* 12/19/2013 2009   MCV 88.5 12/21/2013 2112   MCV 91.2 12/20/2013 0417   MCV 89.7 12/19/2013 2009    Lipid Panel     Component Value Date/Time   CHOL 77 09/02/2011 0446   TRIG 88 09/02/2011 0446   HDL 35* 09/02/2011 0446   CHOLHDL 2.2 09/02/2011 0446   VLDL 18 09/02/2011 0446   LDLCALC 24 09/02/2011 0446    ABG    Component Value Date/Time   TCO2 18 02/11/2009 2057     Lab Results  Component Value Date   TSH 0.085* 12/19/2013   BNP (last 3 results)  Recent Labs  10/13/13 0947 12/19/13 2009 12/21/13 2112  PROBNP 3088.0* 2357.0* 7746.0*   Cardiac Panel (last 3 results) No results found for this basename: CKTOTAL, CKMB, TROPONINI, RELINDX,  in the last 72 hours  Iron/TIBC/Ferritin    Component Value Date/Time   IRON 34* 06/30/2013 0318   TIBC 415 06/30/2013 0318   FERRITIN 18 06/30/2013 0318     EKG Orders placed during the hospital encounter of 12/21/13  . EKG 12-LEAD  . EKG 12-LEAD     Prior Assessment and Plan Problem List as of 12/25/2013     Cardiovascular and Mediastinum   HYPERTENSION   Last Assessment & Plan   07/29/2013 Office Visit Written 07/29/2013  2:27 PM by Jodelle Gross, NP     Blood pressure is well controlled. No changes in her medications at this time.    Atrial fibrillation   Last Assessment & Plan   07/29/2013 Office Visit Written 07/29/2013  2:26 PM by Jodelle Gross, NP     Heart rate is well controlled currently. She will have diltiazem refilled so that she may pick it up today.  She will continue the same dose. See her again in 4 months. To be established with Dr. Wyline Mood.     CONGESTIVE HEART FAILURE, MILD   Last Assessment & Plan   07/29/2013 Office Visit Written 07/29/2013  2:25 PM by Jodelle Gross, NP     She is well compensated. Will make no changes in her meds at this time. I have explained to her that she may noticed some mild edema in the ankles from  uses of diltiazem. She is not to worry about this. If she has significant wt gain, shortness of breath, or edema she is to call us.     HYPOTENSION, ORTHOSTATIC   Last Assessment & Plan   10/26/2012 Office Visit Written 10/27/2012 12:31 PM by Kathlen Brunswick, MD     No orthostatic change in blood pressure at today's visit.    Acute CHF   Atrial fibrillation with RVR     Respiratory   LUNG NODULE     Digestive   Barrett's esophagus   Last Assessment & Plan   04/04/2012 Office Visit Written 04/04/2012  9:50 AM by West Bali, MD     IRREGULAR Z LINE ON EGD APR 2013. UNABLE TO APPRECIATE COLUMNAR EPITHELIUM EXTENDING ABOVE THE JXN.    Gastroparesis   Last Assessment & Plan   04/04/2012 Office Visit Written 04/04/2012  9:51 AM by West Bali, MD     SX CONTROLLED WITH DIET.      Irritable bowel syndrome   Last Assessment & Plan   04/04/2012 Office Visit Written 04/04/2012  9:52 AM by West Bali,  MD     SX FAIRLY WELL CONTROLLED.  TAKE IMODIUM MON WED FRI. OPV IN 6 MOS.      Endocrine   HYPOTHYROIDISM, PRIMARY   DIABETES MELLITUS, TYPE II, ON INSULIN   Last Assessment & Plan   09/01/2011 Clinical Support Written 09/01/2011 10:06 PM by Kathlen Brunswickobert M Rothbart, MD     Patient reports improvement in diabetic control with recent 40 pound weight loss.  She was congratulated on this achievement.      Other   Hyperlipidemia   Last Assessment & Plan   12/26/2012 Office Visit Written 12/26/2012  3:30 PM by Kathlen Brunswickobert M Rothbart, MD     Extremely low total and LDL cholesterol. Current lipid-lowering therapy is effective and will be continued.    OBESITY   Last Assessment & Plan   09/01/2011 Clinical Support Written 09/01/2011 10:08 PM by Kathlen Brunswickobert M Rothbart, MD     Patient reports a 40 pound weight loss.  No weight contained in conjunction with today's visit.    ANEMIA, IRON DEFICIENCY   Last Assessment & Plan   12/26/2012 Office Visit Written 12/27/2012  9:14 PM by Kathlen Brunswickobert M Rothbart, MD      Oral iron therapy started some months ago, but subsequently discontinued, apparently in error. This medication will be reordered. If hemoglobin does not recover towards normal, substitution of intravenous iron should be considered.    LOW BACK PAIN, CHRONIC   SLEEP APNEA   CHEST PAIN-PRECORDIAL   Last Assessment & Plan   09/01/2011 Clinical Support Written 09/03/2011  3:53 PM by Kathlen Brunswickobert M Rothbart, MD     No recent chest discomfort.    CARPAL TUNNEL SYNDROME, HX OF   Chronic anticoagulation   Last Assessment & Plan   10/26/2012 Office Visit Written 10/27/2012 12:30 PM by Kathlen Brunswickobert M Rothbart, MD     Mild anemia persists, likely do to chronic disease although previously characterized as iron deficiency. FOBT will be repeated.    Tremor   Last Assessment & Plan   12/26/2012 Office Visit Written 12/26/2012  3:31 PM by Kathlen Brunswickobert M Rothbart, MD     Tardive dyskinesia is more prominent than I have noted in the past. Metoclopramide may be contributing. Patient is scheduled for reassessment by Dr. Gerilyn Pilgrimoonquah.    Dyspnea   Diarrhea   Abnormal weight loss   Last Assessment & Plan   08/26/2013 Office Visit Written 08/26/2013  9:02 AM by Tiffany KocherLeslie S Lewis, PA-C     25-30 pound unintentional weight loss. Patient complains of anorexia. Continues to have diarrhea, chronic, maybe slightly worse over the past couple of weeks. Biggest concern of new lower abdominal pain over the past four weeks. Denies melena, brbpr. She has chronic IDA but does not take her iron. H/O small bowel ulcer on Givens capsule in 2013, ?NSAIDs related.   At this time, we need to rule out superimposed C.Diff given her multiple hospitalizations and recent antibiotic use. For her weight loss, abdominal pain, will pursue CT A/P to rule out underlying malignancy and look at bowel for any evidence of IBD given h/o SB ulcers. Based on findings, she may require repeat endoscopy/colonoscopy for ongoing symptoms.   She continue to have frequent nausea,  anorexia, but no vomiting. States she is taking reglan QAC/QHS and Zofran twice per day. Await pending working.    Lower abdominal pain   Anorexia nervosa   Encounter for therapeutic drug monitoring   Chest pain at rest   Obesity   Chest pain  Imaging: Dg Chest 2 View  12/03/2013   CLINICAL DATA:  Chest pain, CHF  EXAM: CHEST  2 VIEW  COMPARISON:  CT chest dated 10/13/2013  FINDINGS: No frank interstitial edema. Elevation of the left hemidiaphragm with possible small left pleural effusion. No pneumothorax.  Stable cardiomegaly.  Degenerative changes of the visualized thoracolumbar spine.  IMPRESSION: No frank interstitial edema.  Possible small left pleural effusion.   Electronically Signed   By: Charline Bills M.D.   On: 12/03/2013 18:28   Ct Chest W Contrast  12/20/2013   CLINICAL DATA:  63 year old female with chest pain. Shortness of Breath. Initial encounter.  EXAM: CT CHEST WITH CONTRAST  TECHNIQUE: Multidetector CT imaging of the chest was performed during intravenous contrast administration.  CONTRAST:  80mL OMNIPAQUE IOHEXOL 300 MG/ML  SOLN  COMPARISON:  Portable chest radiograph 12/19/2013 and earlier. Chest CT 10/13/2013 and earlier.  FINDINGS: Intermittently degraded by respiratory motion artifact. Continued low lung volumes. Interval decreased pulmonary septal thickening and peribronchovascular ground-glass opacity. Retrocardiac and right lung base platelike atelectasis and/or scarring not significantly changed. Major airways are patent except for atelectatic changes. No pulmonary consolidation.  Increased extrapleural fat bilaterally.  Trace if any pleural fluid.  Negative thoracic inlet. Cardiomegaly. No pericardial effusion. Central pulmonary artery enlargement is stable. Grossly patent central pulmonary arteries. Stable thoracic aorta. No mediastinal lymphadenopathy. No axillary lymphadenopathy.  Stable visualized upper abdominal viscera. The stomach is more distended with  fluid today.  Stable visualized osseous structures.  IMPRESSION: 1. Severe cardiomegaly and evidence of chronic pulmonary artery hypertension. 2. Regressed/resolved pulmonary interstitial edema compared to 10/13/2013. Stable lung base atelectasis and/or scarring. 3. Trace if any pleural fluid.  No pericardial effusion.   Electronically Signed   By: Augusto Gamble M.D.   On: 12/20/2013 11:12   US Venous Img Lower Unilateral Right  12/22/2013   CLINICAL DATA:  RIGHT lower extremity swelling, history CHF, diabetes, hypertension, paroxysmal atrial fibrillation former smoker, on anticoagulation  EXAM: RIGHT LOWER EXTREMITY VENOUS DOPPLER ULTRASOUND  TECHNIQUE: Gray-scale sonography with graded compression, as well as color Doppler and duplex ultrasound were performed to evaluate the lower extremity deep venous systems from the level of the common femoral vein and including the common femoral, femoral, profunda femoral, popliteal and calf veins including the posterior tibial, peroneal and gastrocnemius veins when visible. The superficial great saphenous vein was also interrogated. Spectral Doppler was utilized to evaluate flow at rest and with distal augmentation maneuvers in the common femoral, femoral and popliteal veins.  COMPARISON:  None  FINDINGS: Common Femoral Vein: No evidence of thrombus. Normal compressibility and flow on color Doppler imaging.  Saphenofemoral Junction: No evidence of thrombus. Normal compressibility and flow on color Doppler imaging.  Profunda Femoral Vein: No evidence of thrombus. Normal compressibility and flow on color Doppler imaging.  Femoral Vein: No evidence of thrombus. Normal compressibility, respiratory phasicity and response to augmentation.  Popliteal Vein: No evidence of thrombus. Normal compressibility, respiratory phasicity and response to augmentation.  Calf Veins: No evidence of thrombus. Normal compressibility and flow on color Doppler imaging.  Superficial Great Saphenous  Vein: No evidence of thrombus. Normal compressibility and flow on color Doppler imaging.  Venous Reflux:  None.  Other Findings:  None.  IMPRESSION: No evidence of deep venous thrombosis in the RIGHT lower extremity.   Electronically Signed   By: Ulyses Southward M.D.   On: 12/22/2013 10:09   Dg Chest Portable 1 View  12/21/2013   CLINICAL DATA:  Shortness  of breath and weakness  EXAM: PORTABLE CHEST - 1 VIEW  COMPARISON:  12/19/2013 and prior chest radiographs  FINDINGS: Cardiomegaly and pulmonary vascular congestion noted. There may be mild interstitial pulmonary edema present.  Left basilar atelectasis again noted.  There is no evidence of pleural effusion or pneumothorax.  No acute bony abnormalities are present.  IMPRESSION: Cardiomegaly with pulmonary vascular congestion and possible mild interstitial pulmonary edema.  Continued left lower lung atelectasis.   Electronically Signed   By: Laveda Abbe M.D.   On: 12/21/2013 21:53   Dg Chest Port 1 View  12/19/2013   CLINICAL DATA:  Left-sided chest pain and shortness of breath since 6:30 p.m. History of CHF, hypertension, diabetes.  EXAM: PORTABLE CHEST - 1 VIEW  COMPARISON:  12/03/2013  FINDINGS: The heart is enlarged. There is left lung base density which partially obscures the hemidiaphragm. Findings are consistent with atelectasis or mild infiltrate. Aeration appears improved since exams earlier in the year. There is mild chronic bronchitic change. No overt edema.  IMPRESSION: 1. Cardiomegaly, stable. 2. Persistent but improved density in the left lung base.   Electronically Signed   By: Rosalie Gums M.D.   On: 12/19/2013 20:39

## 2013-12-25 NOTE — Progress Notes (Signed)
HPI: [name] is a 63 year old patient of Dr. Wyline [name] will follow for ongoing assessment and management of hypertension, atrial fibrillation on Eliquis, chronic diastolic dysfunction, iron deficient anemia, who was recently admitted to [facility] in the setting of acute decompensated diastolic heart failure with complaints of severe dyspnea. The patient is diuresis with IV Lasix, she had a history of medical noncompliance. On discharge she weighed 196 pounds. She was continued on medications prior to her admission. She is being seen on followup  During hospitalization echocardiogram was completed revealing stable LV systolic function with no new wall motion abnormalities. She was not scheduled for stress test as it was felt that her discomfort in her chest is atypical. It is noted to have some pulmonary hypertension, which was likely related to left sided heart failure. Consideration for right heart catheterization was recommended on followup. PA pressure was elevated at 85 mm mercury. There was also moderate tricuspid regurg. As recommended by Dr. Wyline [name] on review of echocardiogram that the patient be scheduled for right heart catheterization.  Since being discharged from the hospital the patient has continued to loose weight, she has no appetite, she has some dizziness with standing, but denies any bleeding or chest discomfort. She is tolerating a takes up and without complaint of stomach upset. She continues to have some issues with breathing New York Heart Association class 2 symptoms. Also states that her blood glucoses the lower than normal in the mornings. Running around the 102.  She has not yet seen her primary care physician Dr. Sudie [name] since discharge.   Allergies  Allergen Reactions  . Ibuprofen     Upsets her stomach and causes abdominal pain  . Celexa [Citalopram Hydrobromide]   . Codeine Nausea And Vomiting    HALLUCINATIONS    Current Outpatient Prescriptions    Medication Sig Dispense Refill  . apixaban (ELIQUIS) 5 MG TABS tablet Take 1 tablet (5 mg total) by mouth 2 (two) times daily.  60 tablet  6  . baclofen (LIORESAL) 10 MG tablet Take 10 mg by mouth 2 (two) times daily.      Marland Kitchen. diltiazem (CARDIZEM CD) 240 MG 24 hr capsule Take 240 mg by mouth every evening.      . furosemide (LASIX) 40 MG tablet Take 40 mg by mouth daily.      Marland Kitchen. glipiZIDE (GLUCOTROL) 10 MG tablet Take 10 mg by mouth 2 (two) times daily.       Marland Kitchen. HYDROcodone-acetaminophen (NORCO) 10-325 MG per tablet Take 1 tablet by mouth 2 (two) times daily as needed for moderate pain or severe pain.       . Insulin Glargine (LANTUS SOLOSTAR) 100 UNIT/ML Solostar Pen Inject 22 Units into the skin at bedtime.       . levETIRAcetam (KEPPRA) 250 MG tablet Take 1 tablet (250 mg total) by mouth 2 (two) times daily.  60 tablet  11  . levothyroxine (SYNTHROID, LEVOTHROID) 125 MCG tablet Take 125 mcg by mouth daily.      Marland Kitchen. loperamide (IMODIUM) 2 MG capsule Take 4 mg by mouth as needed for diarrhea or loose stools.       Marland Kitchen. LORazepam (ATIVAN) 1 MG tablet Take 1-2 mg by mouth daily as needed for anxiety or sleep. For anxiety      . metFORMIN (GLUCOPHAGE) 500 MG tablet Take 1,000 mg by mouth 2 (two) times daily with a meal.       . metoCLOPramide (REGLAN) 10 MG tablet Take  10 mg by mouth 4 (four) times daily.       . metoprolol (LOPRESSOR) 100 MG tablet Take 100 mg by mouth 2 (two) times daily.      . nitroGLYCERIN (NITROSTAT) 0.4 MG SL tablet Place 0.4 mg under the tongue every 5 (five) minutes as needed for chest pain. For chest pain       . omeprazole (PRILOSEC) 20 MG capsule Take 20 mg by mouth 2 (two) times daily.        . ondansetron (ZOFRAN) 8 MG tablet Take 8 mg by mouth daily as needed. nausea      . potassium chloride SA (K-DUR,KLOR-CON) 20 MEQ tablet Take 20 mEq by mouth 3 (three) times daily.       . simvastatin (ZOCOR) 40 MG tablet Take 40 mg by mouth at bedtime.        No current  facility-administered medications for this visit.    Past Medical History  Diagnosis Date  . Chest pain     Negative cardiac catheterization in 2002; negative stress nuclear study in 2008  . Congestive heart failure     Normal LV systolic function  . PAF (paroxysmal atrial fibrillation) 2009  . Chronic anticoagulation   . Diabetes mellitus, type 2     Insulin therapy; exacerbated by prednisone  . Syncope     Admitted 05/2009; magnetic resonance imagin/ MRA - negative; etiology thought to be orthostasis secondary to drugs and dehydration  . GERD (gastroesophageal reflux disease)   . IBS (irritable bowel syndrome)   . Hiatal hernia   . Gastroparesis     99% retention 05/2008 on GES  . Hyperlipidemia   . Hypertension   . Hypothyroid   . Chronic LBP     Surgical intervention in 1996  . Obesity   . OSA on CPAP   . Anemia     H/H of 10/30 with a normal MCV in 12/09  . Barrett's esophagus     Diagnosed 1995. Last EGD 2008.   . Seizures     Past Surgical History  Procedure Laterality Date  . Cardiovascular stress test  2008    Stress nuclear study  . Back surgery  1996  . Laparoscopic cholecystectomy  1990s  . Total abdominal hysterectomy  1999  . Laminectomy  1995    L4-L5  . Carpal tunnel release  1994  . Esophagogastroduodenoscopy  2008    Barrett's without dysplasia. esphagus dilated. antral erosions, h.pylori serologies negative.  . Colonoscopy  11/2011    Dr. Darrick Penna: Internal hemorrhoids, mild diverticulosis. Random colon biopsies negative.  . Givens capsule study  12/07/2011    Proximal small bowel, rare AVM. Distal small bowel, multiple ulcers noted  . Cardiac catheterization  2002  . Esophagogastroduodenoscopy  11/2011    Dr. Darrick Penna: Barrett's esophagus, mild gastritis, diverticulum in the second portion of the duodenum repeat EGD 3 years. Small bowel biopsies negative. Gastric biopsy show reactive gastropathy but no H. pylori. Esophageal biopsies consistent with  GERD.  [employer] capsule study N/A 09/27/2013    Procedure: GIVENS CAPSULE STUDY;  Surgeon: West Bali, MD;  Location: [facility];  Service: Endoscopy;  Laterality: N/A;  7:30  . Givens capsule study N/A 10/10/2013    Procedure: GIVENS CAPSULE STUDY;  Surgeon: West Bali, MD;  Location: [facility];  Service: Endoscopy;  Laterality: N/A;  7:30  . Colonoscopy N/A 11/08/2013    Procedure: COLONOSCOPY WITH ILEOSCOPY;  Surgeon: West Bali, MD;  Location: [facility];  Service: Endoscopy;  Laterality: N/A;  9:30 AM  . Esophageal biopsy N/A 11/08/2013    Procedure: BIOPSY  / Tissue sampling / ulcers present in small intestine;  Surgeon: West Bali, MD;  Location: [facility];  Service: Endoscopy;  Laterality: N/A;    RNH:AFBXUX of systems complete and found to be negative unless listed above PHYSICAL EXAM BP 100/74  Pulse 48  Ht 5\' 8"  (1.727 m)  Wt 187 lb (84.823 kg)  BMI 28.44 kg/m2 General: Well developed, thin, frail,, in no acute distress Head: Eyes PERRLA, No xanthomas.   Normal cephalic and atramatic  Lungs: Clear bilaterally to auscultation and percussion. Heart: HRIR S1 S2, without MRG.  Pulses are 2+ & equal.            No carotid bruit. No JVD.  No abdominal bruits. No femoral bruits. Abdomen: Bowel sounds are positive, abdomen soft and non-tender without masses or                  Hernia's noted. Msk:  Back normal, normal gait. Normal strength and tone for age. Extremities: No clubbing, cyanosis or edema. Multiple varicosities are noted.  DP +1 Neuro: Alert and oriented X 3. Psych:  Good affect, responds appropriately  ASSESSMENT AND PLAN

## 2013-12-25 NOTE — Assessment & Plan Note (Addendum)
Echocardiogram was completed during hospitalization which revealed normal LV systolic function was found to have pulmonary hypertension. She has New York Heart Association class II-III dyspnea on exertion.  Echocardiogram dated 12/20/2013 demonstrated normal LV systolic function of 50-55% with normal wall motion. Due to atrial fibrillation diastolic dysfunction was not able to be evaluated. She was noted to have moderate mitral valve regurg and moderate tricuspid regurg. I arteries revealed systolic pressure was severely increased with a PA pressure of 85 mmHg. The right ventricle was mildly to moderately dilated.  Dr. Verna Czech recommendations the patient have a right heart catheterization completed for better evaluation of pulmonary pressures to assist with medical management. This has been scheduled with Dr. Verdis Prime on July 10th at 12 PM. More recommendations post cardiac cath on followup with Dr. Wyline Mood. I discussed the procedure with the patient and her daughter to the risks and benefits. They are willing to proceed.  She has been advised to hold Eliquis 24 hours prior to the procedure, also metformin, she will not take her evening dose of insulin, and will remain n.p.o. prior to the test the night before.

## 2013-12-25 NOTE — Assessment & Plan Note (Signed)
No evidence of fluid overload currently. Blood pressures on the low side. I decreased her Lasix from 40 mg daily to 20 mg lately and she is having some symptoms of dizziness. She is avoiding salty foods and not adding salt to her foods at all. She is continuing to lose weight and is no evidence of edema in the lower extremities. She will have precatheterization labs to evaluate kidney function. On discharge her kidney function was normal at 0.78 on 12/22/2013.

## 2013-12-27 ENCOUNTER — Encounter: Payer: Medicare Other | Admitting: Adult Health

## 2013-12-29 ENCOUNTER — Emergency Department (HOSPITAL_COMMUNITY): Payer: Medicare Other

## 2013-12-29 ENCOUNTER — Emergency Department (HOSPITAL_COMMUNITY)
Admission: EM | Admit: 2013-12-29 | Discharge: 2013-12-29 | Disposition: A | Discharge status: Home / Self Care | Payer: Medicare Other | Attending: Emergency Medicine | Admitting: Emergency Medicine

## 2013-12-29 ENCOUNTER — Encounter (HOSPITAL_COMMUNITY): Payer: Self-pay | Admitting: Emergency Medicine

## 2013-12-29 DIAGNOSIS — G4733 Obstructive sleep apnea (adult) (pediatric): Secondary | ICD-10-CM | POA: Insufficient documentation

## 2013-12-29 DIAGNOSIS — Z9981 Dependence on supplemental oxygen: Secondary | ICD-10-CM | POA: Insufficient documentation

## 2013-12-29 DIAGNOSIS — Z79899 Other long term (current) drug therapy: Secondary | ICD-10-CM | POA: Insufficient documentation

## 2013-12-29 DIAGNOSIS — E785 Hyperlipidemia, unspecified: Secondary | ICD-10-CM | POA: Insufficient documentation

## 2013-12-29 DIAGNOSIS — S300XXA Contusion of lower back and pelvis, initial encounter: Secondary | ICD-10-CM

## 2013-12-29 DIAGNOSIS — Z791 Long term (current) use of non-steroidal anti-inflammatories (NSAID): Secondary | ICD-10-CM | POA: Insufficient documentation

## 2013-12-29 DIAGNOSIS — E119 Type 2 diabetes mellitus without complications: Secondary | ICD-10-CM | POA: Insufficient documentation

## 2013-12-29 DIAGNOSIS — W010XXA Fall on same level from slipping, tripping and stumbling without subsequent striking against object, initial encounter: Secondary | ICD-10-CM | POA: Insufficient documentation

## 2013-12-29 DIAGNOSIS — Y9389 Activity, other specified: Secondary | ICD-10-CM | POA: Insufficient documentation

## 2013-12-29 DIAGNOSIS — Z7902 Long term (current) use of antithrombotics/antiplatelets: Secondary | ICD-10-CM | POA: Insufficient documentation

## 2013-12-29 DIAGNOSIS — E039 Hypothyroidism, unspecified: Secondary | ICD-10-CM | POA: Insufficient documentation

## 2013-12-29 DIAGNOSIS — Z9889 Other specified postprocedural states: Secondary | ICD-10-CM | POA: Insufficient documentation

## 2013-12-29 DIAGNOSIS — I509 Heart failure, unspecified: Secondary | ICD-10-CM | POA: Insufficient documentation

## 2013-12-29 DIAGNOSIS — Y929 Unspecified place or not applicable: Secondary | ICD-10-CM | POA: Insufficient documentation

## 2013-12-29 DIAGNOSIS — I10 Essential (primary) hypertension: Secondary | ICD-10-CM | POA: Insufficient documentation

## 2013-12-29 DIAGNOSIS — Z862 Personal history of diseases of the blood and blood-forming organs and certain disorders involving the immune mechanism: Secondary | ICD-10-CM | POA: Insufficient documentation

## 2013-12-29 DIAGNOSIS — K219 Gastro-esophageal reflux disease without esophagitis: Secondary | ICD-10-CM | POA: Insufficient documentation

## 2013-12-29 DIAGNOSIS — I4891 Unspecified atrial fibrillation: Secondary | ICD-10-CM | POA: Insufficient documentation

## 2013-12-29 DIAGNOSIS — Z8669 Personal history of other diseases of the nervous system and sense organs: Secondary | ICD-10-CM | POA: Insufficient documentation

## 2013-12-29 DIAGNOSIS — G8929 Other chronic pain: Secondary | ICD-10-CM | POA: Insufficient documentation

## 2013-12-29 DIAGNOSIS — Z794 Long term (current) use of insulin: Secondary | ICD-10-CM | POA: Insufficient documentation

## 2013-12-29 DIAGNOSIS — Z7901 Long term (current) use of anticoagulants: Secondary | ICD-10-CM | POA: Insufficient documentation

## 2013-12-29 DIAGNOSIS — Z87891 Personal history of nicotine dependence: Secondary | ICD-10-CM | POA: Insufficient documentation

## 2013-12-29 MED ORDER — ONDANSETRON 4 MG PO TBDP
4.0000 mg | ORAL_TABLET | Freq: Once | ORAL | Status: AC
  Administered 2013-12-29: 4 mg via ORAL
  Filled 2013-12-29: qty 1

## 2013-12-29 MED ORDER — TRAMADOL HCL 50 MG PO TABS: 50.0000 mg | ORAL_TABLET | Freq: Four times a day (QID) | ORAL | Status: DC | PRN

## 2013-12-29 MED ORDER — HYDROCODONE-ACETAMINOPHEN 5-325 MG PO TABS: 2.0000 | ORAL_TABLET | ORAL | Status: DC | PRN

## 2013-12-29 MED ORDER — HYDROCODONE-ACETAMINOPHEN 5-325 MG PO TABS
1.0000 | ORAL_TABLET | Freq: Once | ORAL | Status: AC
  Administered 2013-12-29: 1 via ORAL
  Filled 2013-12-29: qty 1

## 2013-12-29 NOTE — Discharge Instructions (Signed)
Contusion °A contusion is a deep bruise. Contusions are the result of an injury that caused bleeding under the skin. The contusion may turn blue, purple, or yellow. Minor injuries will give you a painless contusion, but more severe contusions may stay painful and swollen for a few weeks.  °CAUSES  °A contusion is usually caused by a blow, trauma, or direct force to an area of the body. °SYMPTOMS  °· Swelling and redness of the injured area. °· Bruising of the injured area. °· Tenderness and soreness of the injured area. °· Pain. °DIAGNOSIS  °The diagnosis can be made by taking a history and physical exam. An X-ray, CT scan, or MRI may be needed to determine if there were any associated injuries, such as fractures. °TREATMENT  °Specific treatment will depend on what area of the body was injured. In general, the best treatment for a contusion is resting, icing, elevating, and applying cold compresses to the injured area. Over-the-counter medicines may also be recommended for pain control. Ask your caregiver what the best treatment is for your contusion. °HOME CARE INSTRUCTIONS  °· Put ice on the injured area. °¨ Put ice in a plastic bag. °¨ Place a towel between your skin and the bag. °¨ Leave the ice on for 15-20 minutes, 3-4 times a day, or as directed by your health care provider. °· Only take over-the-counter or prescription medicines for pain, discomfort, or fever as directed by your caregiver. Your caregiver may recommend avoiding anti-inflammatory medicines (aspirin, ibuprofen, and naproxen) for 48 hours because these medicines may increase bruising. °· Rest the injured area. °· If possible, elevate the injured area to reduce swelling. °SEEK IMMEDIATE MEDICAL CARE IF:  °· You have increased bruising or swelling. °· You have pain that is getting worse. °· Your swelling or pain is not relieved with medicines. °MAKE SURE YOU:  °· Understand these instructions. °· Will watch your condition. °· Will get help right  away if you are not doing well or get worse. °Document Released: 03/30/2005 Document Revised: 06/25/2013 Document Reviewed: 04/25/2011 °ExitCare® Patient Information ©2015 [employer]. This information is not intended to replace advice given to you by your health care provider. Make sure you discuss any questions you have with your health care provider. ° °

## 2013-12-29 NOTE — ED Notes (Signed)
PT got up from bed around 1140pm and slipped and fell against railings of bed. PT c/o lower back pain and right arm pain. No deformities noted.

## 2013-12-29 NOTE — ED Provider Notes (Signed)
CSN: [id number]     Arrival date & time 12/29/13  0932 History  This chart was scribed for Rolland PorterMark James, MD by Ardelia Memsylan Malpass, ED Scribe. This patient was seen in room [facility]03[facility] and the patient's care was started at 9:48 AM.   Chief Complaint  Patient presents with  . Fall    The history is provided by the patient. No language interpreter was used.    HPI Comments: [name] is a 63 y.o. female who presents to the Emergency Department complaining of a fall that occurred last night. Pt states that she was getting out of bed to use the bathroom, and she lost her balance and fell on her right side. She is complaining of constant, moderate pain in her tailbone as well as in her right forearm onset after the fall. She states that her forearm pain is worsened with palpation. She denies hitting her head. She denies any neck pain, shoulder pain or palpitations.    Past Medical History  Diagnosis Date  . Chest pain     Negative cardiac catheterization in 2002; negative stress nuclear study in 2008  . Congestive heart failure     Normal LV systolic function  . PAF (paroxysmal atrial fibrillation) 2009  . Chronic anticoagulation   . Diabetes mellitus, type 2     Insulin therapy; exacerbated by prednisone  . Syncope     Admitted 05/2009; magnetic resonance imagin/ MRA - negative; etiology thought to be orthostasis secondary to drugs and dehydration  . GERD (gastroesophageal reflux disease)   . IBS (irritable bowel syndrome)   . Hiatal hernia   . Gastroparesis     99% retention 05/2008 on GES  . Hyperlipidemia   . Hypertension   . Hypothyroid   . Chronic LBP     Surgical intervention in 1996  . Obesity   . OSA on CPAP   . Anemia     H/H of 10/30 with a normal MCV in 12/09  . Barrett's esophagus     Diagnosed 1995. Last EGD 2008.   . Seizures    Past Surgical History  Procedure Laterality Date  . Cardiovascular stress test  2008    Stress nuclear study  . Back surgery  1996  .  Laparoscopic cholecystectomy  1990s  . Total abdominal hysterectomy  1999  . Laminectomy  1995    L4-L5  . Carpal tunnel release  1994  . Esophagogastroduodenoscopy  2008    Barrett's without dysplasia. esphagus dilated. antral erosions, h.pylori serologies negative.  . Colonoscopy  11/2011    Dr. Darrick Pennafields: Internal hemorrhoids, mild diverticulosis. Random colon biopsies negative.  . Givens capsule study  12/07/2011    Proximal small bowel, rare AVM. Distal small bowel, multiple ulcers noted  . Cardiac catheterization  2002  . Esophagogastroduodenoscopy  11/2011    Dr. Darrick PennaFields: Barrett's esophagus, mild gastritis, diverticulum in the second portion of the duodenum repeat EGD 3 years. Small bowel biopsies negative. Gastric biopsy show reactive gastropathy but no H. pylori. Esophageal biopsies consistent with GERD.  Emelda Brothers. Givens capsule study N/A 09/27/2013    Procedure: GIVENS CAPSULE STUDY;  Surgeon: West BaliSandi L Fields, MD;  Location: [facility];  Service: Endoscopy;  Laterality: N/A;  7:30  . Givens capsule study N/A 10/10/2013    Procedure: GIVENS CAPSULE STUDY;  Surgeon: West BaliSandi L Fields, MD;  [facility]: [facility];  Service: Endoscopy;  Laterality: N/A;  7:30  . Colonoscopy N/A 11/08/2013    Procedure: COLONOSCOPY WITH  ILEOSCOPY;  Surgeon: West Bali, MD;  [facility]: [facility];  Service: Endoscopy;  Laterality: N/A;  9:30 AM  . Esophageal biopsy N/A 11/08/2013    Procedure: BIOPSY  / Tissue sampling / ulcers present in small intestine;  Surgeon: West Bali, MD;  Location: [facility];  Service: Endoscopy;  Laterality: N/A;   Family History  Problem Relation Age of Onset  . Hypertension Mother   . Alzheimer's disease Mother   . Stroke Mother   . Heart attack Mother   . Heart disease Neg Hx   . Hypertension Other   . Colon cancer Neg Hx    History  Substance Use Topics  . Smoking status: Former Smoker -- 15 years    Quit date: 07/01/1983  . Smokeless tobacco: Never Used     Comment:  Remote  . Alcohol Use: No     Comment: last etoh one year ago, never frequent   OB History   Grav Para Term Preterm Abortions TAB SAB Ect Mult Living   2 2 2             Review of Systems  Constitutional: Negative for fever, chills, diaphoresis, appetite change and fatigue.  HENT: Negative for mouth sores, sore throat and trouble swallowing.   Eyes: Negative for visual disturbance.  Respiratory: Negative for cough, chest tightness, shortness of breath and wheezing.   Cardiovascular: Negative for chest pain.  Gastrointestinal: Negative for nausea, vomiting, abdominal pain, diarrhea and abdominal distention.  Endocrine: Negative for polydipsia, polyphagia and polyuria.  Genitourinary: Negative for dysuria, frequency and hematuria.  Musculoskeletal: Negative for gait problem.  Skin: Negative for color change, pallor and rash.  Neurological: Negative for dizziness, syncope, light-headedness and headaches.  Hematological: Does not bruise/bleed easily.  Psychiatric/Behavioral: Negative for behavioral problems and confusion.    Allergies  Ibuprofen; Celexa; and Codeine  Home Medications   Prior to Admission medications   Medication Sig Start Date End Date Taking? Authorizing Provider  apixaban (ELIQUIS) 5 MG TABS tablet Take 1 tablet (5 mg total) by mouth 2 (two) times daily. 12/16/13  Yes Antoine Poche, MD  baclofen (LIORESAL) 10 MG tablet Take 10 mg by mouth 2 (two) times daily. 11/15/13  Yes Historical Provider, MD  diltiazem (CARDIZEM CD) 240 MG 24 hr capsule Take 240 mg by mouth every evening. 07/04/13  Yes [name], MD  furosemide (LASIX) 40 MG tablet Take 40 mg by mouth daily. 12/03/13  Yes Historical Provider, MD  glipiZIDE (GLUCOTROL) 10 MG tablet Take 10 mg by mouth 2 (two) times daily.    Yes Historical Provider, MD  Insulin Glargine (LANTUS SOLOSTAR) 100 UNIT/ML Solostar Pen Inject 22 Units into the skin at bedtime.    Yes Historical Provider, MD  levETIRAcetam  (KEPPRA) 250 MG tablet Take 1 tablet (250 mg total) by mouth 2 (two) times daily. 11/10/12  Yes [name], MD  levothyroxine (SYNTHROID, LEVOTHROID) 125 MCG tablet Take 125 mcg by mouth daily.   Yes Historical Provider, MD  loperamide (IMODIUM) 2 MG capsule Take 4 mg by mouth as needed for diarrhea or loose stools.    Yes Historical Provider, MD  LORazepam (ATIVAN) 1 MG tablet Take 1-2 mg by mouth daily as needed for anxiety or sleep. For anxiety   Yes Historical Provider, MD  metFORMIN (GLUCOPHAGE) 500 MG tablet Take 1,000 mg by mouth 2 (two) times daily with a meal.    Yes Historical Provider, MD  metoCLOPramide (REGLAN) 10 MG  tablet Take 10 mg by mouth 4 (four) times daily.  09/20/12  Yes Historical Provider, MD  metoprolol (LOPRESSOR) 100 MG tablet Take 100 mg by mouth 2 (two) times daily.   Yes Historical Provider, MD  nitroGLYCERIN (NITROSTAT) 0.4 MG SL tablet Place 0.4 mg under the tongue every 5 (five) minutes as needed for chest pain. For chest pain    Yes Historical Provider, MD  omeprazole (PRILOSEC) 20 MG capsule Take 20 mg by mouth 2 (two) times daily.     Yes Historical Provider, MD  ondansetron (ZOFRAN-ODT) 8 MG disintegrating tablet Take 8 mg by mouth daily as needed for nausea or vomiting.   Yes Historical Provider, MD  potassium chloride SA (K-DUR,KLOR-CON) 20 MEQ tablet Take 20 mEq by mouth 3 (three) times daily.    Yes Historical Provider, MD  HYDROcodone-acetaminophen (NORCO/VICODIN) 5-325 MG per tablet Take 2 tablets by mouth every 4 (four) hours as needed. 12/29/13   Rolland Porter, MD  simvastatin (ZOCOR) 40 MG tablet Take 40 mg by mouth at bedtime.     Historical Provider, MD   Triage Vitals: BP 113/83  Pulse 68  Temp(Src) 97.6 F (36.4 C) (Oral)  Resp 18  Ht 5\' 8"  (1.727 m)  Wt 186 lb (84.369 kg)  BMI 28.29 kg/m2  SpO2 96%  Physical Exam  Nursing note and vitals reviewed. Constitutional: She is oriented to person, place, and time. She appears well-developed and  well-nourished. No distress.  HENT:  Head: Normocephalic.  Eyes: Conjunctivae are normal. Pupils are equal, round, and reactive to light. No scleral icterus.  Neck: Normal range of motion. Neck supple. No thyromegaly present.  Cardiovascular: Normal rate and regular rhythm.  Exam reveals no gallop and no friction rub.   No murmur heard. Regular pulse  Pulmonary/Chest: Effort normal and breath sounds normal. No respiratory distress. She has no wheezes. She has no rales.  Abdominal: Soft. Bowel sounds are normal. She exhibits no distension. There is no tenderness. There is no rebound.  Musculoskeletal: Normal range of motion. She exhibits tenderness.  Tenderness in posterior right elbow. Full ROM. There is tenderness directly over the coccyx.  Neurological: She is alert and oriented to person, place, and time.  Skin: Skin is warm and dry. No rash noted.  Psychiatric: She has a normal mood and affect. Her behavior is normal.    ED Course  Procedures (including critical care time)  DIAGNOSTIC STUDIES: Oxygen Saturation is 96% on RA, adequate by my interpretation.    COORDINATION OF CARE: 9:51 AM- Pt advised of plan for treatment and pt agrees.  Labs Review Labs Reviewed - No data to display  Imaging Review No results found.   EKG Interpretation None      MDM   Final diagnoses:  Contusion, buttock, initial encounter    X-ray show no acute processes. Plan will be home. Symptomatic treatment.   I personally performed the services described in this documentation, which was scribed in my presence. The recorded information has been reviewed and is accurate.   Rolland Porter, MD 12/29/13 1119

## 2013-12-29 NOTE — ED Notes (Signed)
Patient with no complaints at this time. Respirations even and unlabored. Skin warm/dry. Discharge instructions reviewed with patient at this time. Patient given opportunity to voice concerns/ask questions. Patient discharged at this time and left Emergency Department with steady gait.   

## 2013-12-29 NOTE — ED Notes (Signed)
MD at bedside. 

## 2014-01-03 ENCOUNTER — Encounter (HOSPITAL_COMMUNITY): Payer: Self-pay | Admitting: Emergency Medicine

## 2014-01-03 ENCOUNTER — Emergency Department (HOSPITAL_COMMUNITY): Payer: Medicare Other

## 2014-01-03 ENCOUNTER — Emergency Department (HOSPITAL_COMMUNITY)
Admission: EM | Admit: 2014-01-03 | Discharge: 2014-01-04 | Disposition: A | Discharge status: Home / Self Care | Payer: Medicare Other | Attending: Emergency Medicine | Admitting: Emergency Medicine

## 2014-01-03 DIAGNOSIS — E785 Hyperlipidemia, unspecified: Secondary | ICD-10-CM | POA: Insufficient documentation

## 2014-01-03 DIAGNOSIS — E039 Hypothyroidism, unspecified: Secondary | ICD-10-CM | POA: Insufficient documentation

## 2014-01-03 DIAGNOSIS — I4891 Unspecified atrial fibrillation: Secondary | ICD-10-CM | POA: Insufficient documentation

## 2014-01-03 DIAGNOSIS — Z9119 Patient's noncompliance with other medical treatment and regimen: Secondary | ICD-10-CM | POA: Insufficient documentation

## 2014-01-03 DIAGNOSIS — Z862 Personal history of diseases of the blood and blood-forming organs and certain disorders involving the immune mechanism: Secondary | ICD-10-CM | POA: Insufficient documentation

## 2014-01-03 DIAGNOSIS — Z9889 Other specified postprocedural states: Secondary | ICD-10-CM | POA: Insufficient documentation

## 2014-01-03 DIAGNOSIS — E669 Obesity, unspecified: Secondary | ICD-10-CM | POA: Insufficient documentation

## 2014-01-03 DIAGNOSIS — E119 Type 2 diabetes mellitus without complications: Secondary | ICD-10-CM | POA: Insufficient documentation

## 2014-01-03 DIAGNOSIS — Z7901 Long term (current) use of anticoagulants: Secondary | ICD-10-CM | POA: Insufficient documentation

## 2014-01-03 DIAGNOSIS — I10 Essential (primary) hypertension: Secondary | ICD-10-CM | POA: Insufficient documentation

## 2014-01-03 DIAGNOSIS — Z79899 Other long term (current) drug therapy: Secondary | ICD-10-CM | POA: Insufficient documentation

## 2014-01-03 DIAGNOSIS — Z9114 Patient's other noncompliance with medication regimen: Secondary | ICD-10-CM

## 2014-01-03 DIAGNOSIS — Z87891 Personal history of nicotine dependence: Secondary | ICD-10-CM | POA: Insufficient documentation

## 2014-01-03 DIAGNOSIS — G40909 Epilepsy, unspecified, not intractable, without status epilepticus: Secondary | ICD-10-CM | POA: Insufficient documentation

## 2014-01-03 DIAGNOSIS — R55 Syncope and collapse: Secondary | ICD-10-CM | POA: Insufficient documentation

## 2014-01-03 DIAGNOSIS — Z91148 Patient's other noncompliance with medication regimen for other reason: Secondary | ICD-10-CM

## 2014-01-03 DIAGNOSIS — I5032 Chronic diastolic (congestive) heart failure: Secondary | ICD-10-CM

## 2014-01-03 DIAGNOSIS — Z91199 Patient's noncompliance with other medical treatment and regimen due to unspecified reason: Secondary | ICD-10-CM | POA: Insufficient documentation

## 2014-01-03 DIAGNOSIS — K219 Gastro-esophageal reflux disease without esophagitis: Secondary | ICD-10-CM | POA: Insufficient documentation

## 2014-01-03 DIAGNOSIS — Z8719 Personal history of other diseases of the digestive system: Secondary | ICD-10-CM | POA: Insufficient documentation

## 2014-01-03 LAB — BASIC METABOLIC PANEL
Anion gap: 12 (ref 5–15)
BUN: 23 mg/dL (ref 6–23)
CO2: 24 mEq/L (ref 19–32)
CREATININE: 0.78 mg/dL (ref 0.50–1.10)
Calcium: 9.2 mg/dL (ref 8.4–10.5)
Chloride: 106 mEq/L (ref 96–112)
GFR calc Af Amer: >90 mL/min (ref >90)
GFR, EST NON AFRICAN AMERICAN: 87 mL/min — AB (ref >90)
GLUCOSE: 103 mg/dL — AB (ref 70–99)
POTASSIUM: 4.3 meq/L (ref 3.7–5.3)
Sodium: 142 mEq/L (ref 137–147)

## 2014-01-03 LAB — CBC WITH DIFFERENTIAL/PLATELET
Basophils Absolute: 0 10*3/uL (ref 0.0–0.1)
Basophils Relative: 0 % (ref 0–1)
Eosinophils Absolute: 0.1 10*3/uL (ref 0.0–0.7)
Eosinophils Relative: 1 % (ref 0–5)
HCT: 37.5 % (ref 36.0–46.0)
Hemoglobin: 11.9 g/dL — ABNORMAL LOW (ref 12.0–15.0)
LYMPHS ABS: 3 10*3/uL (ref 0.7–4.0)
Lymphocytes Relative: 42 % (ref 12–46)
MCH: 28.1 pg (ref 26.0–34.0)
MCHC: 31.7 g/dL (ref 30.0–36.0)
MCV: 88.4 fL (ref 78.0–100.0)
MONOS PCT: 12 % (ref 3–12)
Monocytes Absolute: 0.8 10*3/uL (ref 0.1–1.0)
NEUTROS PCT: 45 % (ref 43–77)
Neutro Abs: 3.2 10*3/uL (ref 1.7–7.7)
Platelets: 264 10*3/uL (ref 150–400)
RBC: 4.24 MIL/uL (ref 3.87–5.11)
RDW: 14.4 % (ref 11.5–15.5)
WBC: 7.1 10*3/uL (ref 4.0–10.5)

## 2014-01-03 LAB — TROPONIN I: Troponin I: <0.3 ng/mL (ref <0.30)

## 2014-01-03 LAB — PRO B NATRIURETIC PEPTIDE: Pro B Natriuretic peptide (BNP): 1015 pg/mL — ABNORMAL HIGH (ref 0–125)

## 2014-01-03 MED ORDER — FUROSEMIDE 40 MG PO TABS
40.0000 mg | ORAL_TABLET | Freq: Once | ORAL | Status: AC
  Administered 2014-01-03: 40 mg via ORAL
  Filled 2014-01-03: qty 1

## 2014-01-03 NOTE — ED Notes (Signed)
Pt states shortness of breath started about 830 this evening. No pain in chest of discomfort. States she took her lasix yesterday.

## 2014-01-03 NOTE — ED Provider Notes (Signed)
CSN: [id number]     Arrival date & time 01/03/14  2214 History   First MD Initiated Contact with Patient 01/03/14 2303    This chart was scribed for Hilario Quarry, MD by Marica Otter, ED Scribe. This patient was seen in room APA09/[facility]09 and the patient's care was started at 11:18 PM.  Chief Complaint  Patient presents with  . Shortness of Breath   PCP: [name], MD  Patient is a 63 y.o. female presenting with shortness of breath. The history is provided by the patient and a relative. No language interpreter was used.  Shortness of Breath Duration:  150 minutes Timing:  Constant Chronicity:  New Associated symptoms: chest pain   Associated symptoms: no cough and no fever   Risk factors: obesity    HPI Comments: HARLOW BASLEY is a 63 y.o. female, with an extensive medical Hx noted below and significant for CHF, A-Fib, DM TII, GERD, HTN, HLD and seizures, who presents to the Emergency Department complaining of worsening SOB onset at 8:00PM tonight while pt was laying in bed at rest. Patient denies other symptoms- chest pain, leg swelling, fever, cough.   She notes that she did not take her Lasix today.  She would normally have taken it at 0800 but did not as she was going out.   Pt reports she is on 20 MG of Lasix daily. Pt further notes that she ate out this afternoon for lunch where she had a heavy, high sodium meal.   Pt denies any other pain, fever or cough. Pt notes that she was treated at the ED for SOB several times.    Past Medical History  Diagnosis Date  . Chest pain     Negative cardiac catheterization in 2002; negative stress nuclear study in 2008  . Congestive heart failure     Normal LV systolic function  . PAF (paroxysmal atrial fibrillation) 2009  . Chronic anticoagulation   . Diabetes mellitus, type 2     Insulin therapy; exacerbated by prednisone  . Syncope     Admitted 05/2009; magnetic resonance imagin/ MRA - negative; etiology thought to be orthostasis  secondary to drugs and dehydration  . GERD (gastroesophageal reflux disease)   . IBS (irritable bowel syndrome)   . Hiatal hernia   . Gastroparesis     99% retention 05/2008 on GES  . Hyperlipidemia   . Hypertension   . Hypothyroid   . Chronic LBP     Surgical intervention in 1996  . Obesity   . OSA on CPAP   . Anemia     H/H of 10/30 with a normal MCV in 12/09  . Barrett's esophagus     Diagnosed 1995. Last EGD 2008.   . Seizures    Past Surgical History  Procedure Laterality Date  . Cardiovascular stress test  2008    Stress nuclear study  . Back surgery  1996  . Laparoscopic cholecystectomy  1990s  . Total abdominal hysterectomy  1999  . Laminectomy  1995    L4-L5  . Carpal tunnel release  1994  . Esophagogastroduodenoscopy  2008    Barrett's without dysplasia. esphagus dilated. antral erosions, h.pylori serologies negative.  . Colonoscopy  11/2011    Dr. Darrick Penna: Internal hemorrhoids, mild diverticulosis. Random colon biopsies negative.  . Givens capsule study  12/07/2011    Proximal small bowel, rare AVM. Distal small bowel, multiple ulcers noted  . Cardiac catheterization  2002  . Esophagogastroduodenoscopy  11/2011  Dr. Darrick Penna: Barrett's esophagus, mild gastritis, diverticulum in the second portion of the duodenum repeat EGD 3 years. Small bowel biopsies negative. Gastric biopsy show reactive gastropathy but no H. pylori. Esophageal biopsies consistent with GERD.  [employer] capsule study N/A 09/27/2013    Procedure: GIVENS CAPSULE STUDY;  Surgeon: West Bali, MD;  Location: [facility];  Service: Endoscopy;  Laterality: N/A;  7:30  . Givens capsule study N/A 10/10/2013    Procedure: GIVENS CAPSULE STUDY;  Surgeon: West Bali, MD;  Location: [facility];  Service: Endoscopy;  Laterality: N/A;  7:30  . Colonoscopy N/A 11/08/2013    Procedure: COLONOSCOPY WITH ILEOSCOPY;  Surgeon: West Bali, MD;  Location: [facility];  Service: Endoscopy;  Laterality: N/A;   9:30 AM  . Esophageal biopsy N/A 11/08/2013    Procedure: BIOPSY  / Tissue sampling / ulcers present in small intestine;  Surgeon: West Bali, MD;  Location: [facility];  Service: Endoscopy;  Laterality: N/A;   Family History  Problem Relation Age of Onset  . Hypertension Mother   . Alzheimer's disease Mother   . Stroke Mother   . Heart attack Mother   . Heart disease Neg Hx   . Hypertension Other   . Colon cancer Neg Hx    History  Substance Use Topics  . Smoking status: Former Smoker -- 15 years    Quit date: 07/01/1983  . Smokeless tobacco: Never Used     Comment: Remote  . Alcohol Use: No     Comment: last etoh one year ago, never frequent   OB History   Grav Para Term Preterm Abortions TAB SAB Ect Mult Living   2 2 2             Review of Systems  Constitutional: Negative for fever.  Respiratory: Positive for shortness of breath. Negative for cough.   Cardiovascular: Positive for chest pain.  Psychiatric/Behavioral: The patient is nervous/anxious.       Allergies  Ibuprofen; Celexa; and Codeine  Home Medications   Prior to Admission medications   Medication Sig Start Date End Date Taking? Authorizing Provider  apixaban (ELIQUIS) 5 MG TABS tablet Take 1 tablet (5 mg total) by mouth 2 (two) times daily. 12/16/13   Antoine Poche, MD  baclofen (LIORESAL) 10 MG tablet Take 10 mg by mouth 2 (two) times daily. 11/15/13   Historical Provider, MD  diltiazem (CARDIZEM CD) 240 MG 24 hr capsule Take 240 mg by mouth every evening. 07/04/13   [name], MD  furosemide (LASIX) 40 MG tablet Take 40 mg by mouth daily. 12/03/13   Historical Provider, MD  glipiZIDE (GLUCOTROL) 10 MG tablet Take 10 mg by mouth 2 (two) times daily.     Historical Provider, MD  HYDROcodone-acetaminophen (NORCO/VICODIN) 5-325 MG per tablet Take 2 tablets by mouth every 4 (four) hours as needed. 12/29/13   Rolland Porter, MD  Insulin Glargine (LANTUS SOLOSTAR) 100 UNIT/ML Solostar Pen Inject 22  Units into the skin at bedtime.     Historical Provider, MD  levETIRAcetam (KEPPRA) 250 MG tablet Take 1 tablet (250 mg total) by mouth 2 (two) times daily. 11/10/12   [name], MD  levothyroxine (SYNTHROID, LEVOTHROID) 125 MCG tablet Take 125 mcg by mouth daily.    Historical Provider, MD  loperamide (IMODIUM) 2 MG capsule Take 4 mg by mouth as needed for diarrhea or loose stools.     Historical Provider, MD  LORazepam (  ATIVAN) 1 MG tablet Take 1-2 mg by mouth daily as needed for anxiety or sleep. For anxiety    Historical Provider, MD  metFORMIN (GLUCOPHAGE) 500 MG tablet Take 1,000 mg by mouth 2 (two) times daily with a meal.     Historical Provider, MD  metoCLOPramide (REGLAN) 10 MG tablet Take 10 mg by mouth 4 (four) times daily.  09/20/12   Historical Provider, MD  metoprolol (LOPRESSOR) 100 MG tablet Take 100 mg by mouth 2 (two) times daily.    Historical Provider, MD  nitroGLYCERIN (NITROSTAT) 0.4 MG SL tablet Place 0.4 mg under the tongue every 5 (five) minutes as needed for chest pain. For chest pain     Historical Provider, MD  omeprazole (PRILOSEC) 20 MG capsule Take 20 mg by mouth 2 (two) times daily.      Historical Provider, MD  ondansetron (ZOFRAN-ODT) 8 MG disintegrating tablet Take 8 mg by mouth daily as needed for nausea or vomiting.    Historical Provider, MD  potassium chloride SA (K-DUR,KLOR-CON) 20 MEQ tablet Take 20 mEq by mouth 3 (three) times daily.     Historical Provider, MD  simvastatin (ZOCOR) 40 MG tablet Take 40 mg by mouth at bedtime.     Historical Provider, MD  traMADol (ULTRAM) 50 MG tablet Take 1 tablet (50 mg total) by mouth every 6 (six) hours as needed. 12/29/13   Rolland PorterMark James, MD   Triage Vitals: BP 103/57  Pulse 77  Temp(Src) 97.7 F (36.5 C) (Oral)  Resp 24  Ht 5\' 8"  (1.727 m)  Wt 186 lb (84.369 kg)  BMI 28.29 kg/m2  SpO2 98% Physical Exam  Nursing note and vitals reviewed. Constitutional: She is oriented to person, place, and time. She  appears well-developed and well-nourished.  HENT:  Head: Normocephalic and atraumatic.  Right Ear: External ear normal.  Left Ear: External ear normal.  Nose: Nose normal.  Mouth/Throat: Oropharynx is clear and moist.  Eyes: Conjunctivae are normal. Pupils are equal, round, and reactive to light.  Neck: Normal range of motion. Neck supple. No JVD present.  Cardiovascular: Normal rate, regular rhythm and normal heart sounds.   Pulmonary/Chest: Effort normal and breath sounds normal. No respiratory distress. She has no wheezes. She has no rales.  Abdominal: Soft. Bowel sounds are normal. She exhibits no distension. There is no tenderness.  Musculoskeletal: Normal range of motion. She exhibits no edema.  Neurological: She is alert and oriented to person, place, and time. She has normal reflexes.  Skin: Skin is warm and dry.  Psychiatric: She has a normal mood and affect. Her behavior is normal. Judgment and thought content normal.    ED Course  Procedures (including critical care time) DIAGNOSTIC STUDIES: Oxygen Saturation is 98% on RA, nl by my interpretation.    COORDINATION OF CARE: 11:25 PM-Discussed treatment plan which includes administering 2 doses of Lasix with pt at bedside and pt agreed to plan.   Labs Review Labs Reviewed  CBC WITH DIFFERENTIAL - Abnormal; Notable for the following:    Hemoglobin 11.9 (*)    All other components within normal limits  PRO B NATRIURETIC PEPTIDE  BASIC METABOLIC PANEL  TROPONIN I    Imaging Review Dg Chest Portable 1 View  01/03/2014   CLINICAL DATA:  Shortness of breast.  EXAM: PORTABLE CHEST - 1 VIEW  COMPARISON:  12/21/2013  FINDINGS: Cardiomegaly with vascular congestion. No overt edema. No confluent opacities or effusions. No acute bony abnormality.  IMPRESSION: Cardiomegaly, vascular congestion.  Electronically Signed   By: Charlett Nose M.D.   On: 01/03/2014 23:15     EKG Interpretation   Date/Time:  Friday January 03 2014 22:52:18  EDT Ventricular Rate:  74 PR Interval:    QRS Duration: 102 QT Interval:  494 QTC Calculation: 548 R Axis:   -32 Text Interpretation:  Atrial fibrillation No significant change was found  from first prior ekg except rate has decreased Confirmed by Frankee Gritz MD,  Duwayne Heck [phone]) on 01/03/2014 11:15:08 PM      MDM   Final diagnoses:  Chronic diastolic congestive heart failure  Noncompliance with medication regimen   Patient with history of chf noncompliant with meds today.  She has some dyspnea with normal sats, baseline ekg, troponin normal, and some elevated bnp.  Patient given usual dose of lasix plus additional 20 mg.  Patient advised regarding return precautions and medication compliance.  She is scheduled for a right sided heart cath this week.     Hilario Quarry, MD 01/04/14 [id number]

## 2014-01-04 NOTE — Discharge Instructions (Signed)
Please take your medicines as prescribed.  Follow up as scheduled this week.

## 2014-01-06 ENCOUNTER — Encounter: Payer: Self-pay | Admitting: General Practice

## 2014-01-07 ENCOUNTER — Encounter (HOSPITAL_COMMUNITY): Payer: Self-pay | Admitting: Emergency Medicine

## 2014-01-07 ENCOUNTER — Other Ambulatory Visit: Payer: Self-pay

## 2014-01-07 ENCOUNTER — Emergency Department (HOSPITAL_COMMUNITY)
Admission: EM | Admit: 2014-01-07 | Discharge: 2014-01-07 | Disposition: A | Discharge status: Home / Self Care | Payer: Medicare Other | Attending: Emergency Medicine | Admitting: Emergency Medicine

## 2014-01-07 ENCOUNTER — Emergency Department (HOSPITAL_COMMUNITY): Payer: Medicare Other

## 2014-01-07 DIAGNOSIS — Z794 Long term (current) use of insulin: Secondary | ICD-10-CM | POA: Insufficient documentation

## 2014-01-07 DIAGNOSIS — Z87891 Personal history of nicotine dependence: Secondary | ICD-10-CM | POA: Insufficient documentation

## 2014-01-07 DIAGNOSIS — K589 Irritable bowel syndrome without diarrhea: Secondary | ICD-10-CM | POA: Insufficient documentation

## 2014-01-07 DIAGNOSIS — K219 Gastro-esophageal reflux disease without esophagitis: Secondary | ICD-10-CM | POA: Insufficient documentation

## 2014-01-07 DIAGNOSIS — I509 Heart failure, unspecified: Secondary | ICD-10-CM | POA: Insufficient documentation

## 2014-01-07 DIAGNOSIS — E119 Type 2 diabetes mellitus without complications: Secondary | ICD-10-CM | POA: Insufficient documentation

## 2014-01-07 DIAGNOSIS — Z7902 Long term (current) use of antithrombotics/antiplatelets: Secondary | ICD-10-CM | POA: Insufficient documentation

## 2014-01-07 DIAGNOSIS — Z7901 Long term (current) use of anticoagulants: Secondary | ICD-10-CM | POA: Insufficient documentation

## 2014-01-07 DIAGNOSIS — G8929 Other chronic pain: Secondary | ICD-10-CM | POA: Insufficient documentation

## 2014-01-07 DIAGNOSIS — Z885 Allergy status to narcotic agent status: Secondary | ICD-10-CM | POA: Insufficient documentation

## 2014-01-07 DIAGNOSIS — G4733 Obstructive sleep apnea (adult) (pediatric): Secondary | ICD-10-CM | POA: Insufficient documentation

## 2014-01-07 DIAGNOSIS — K2270 Barrett's esophagus without dysplasia: Secondary | ICD-10-CM | POA: Insufficient documentation

## 2014-01-07 DIAGNOSIS — Z79899 Other long term (current) drug therapy: Secondary | ICD-10-CM | POA: Insufficient documentation

## 2014-01-07 DIAGNOSIS — E039 Hypothyroidism, unspecified: Secondary | ICD-10-CM | POA: Insufficient documentation

## 2014-01-07 DIAGNOSIS — I10 Essential (primary) hypertension: Secondary | ICD-10-CM | POA: Insufficient documentation

## 2014-01-07 DIAGNOSIS — D649 Anemia, unspecified: Secondary | ICD-10-CM | POA: Insufficient documentation

## 2014-01-07 DIAGNOSIS — I480 Paroxysmal atrial fibrillation: Secondary | ICD-10-CM

## 2014-01-07 DIAGNOSIS — R112 Nausea with vomiting, unspecified: Secondary | ICD-10-CM | POA: Insufficient documentation

## 2014-01-07 DIAGNOSIS — R072 Precordial pain: Secondary | ICD-10-CM | POA: Insufficient documentation

## 2014-01-07 DIAGNOSIS — M545 Low back pain, unspecified: Secondary | ICD-10-CM | POA: Insufficient documentation

## 2014-01-07 DIAGNOSIS — R55 Syncope and collapse: Secondary | ICD-10-CM | POA: Insufficient documentation

## 2014-01-07 DIAGNOSIS — E785 Hyperlipidemia, unspecified: Secondary | ICD-10-CM | POA: Insufficient documentation

## 2014-01-07 DIAGNOSIS — Z888 Allergy status to other drugs, medicaments and biological substances status: Secondary | ICD-10-CM | POA: Insufficient documentation

## 2014-01-07 DIAGNOSIS — I4891 Unspecified atrial fibrillation: Secondary | ICD-10-CM | POA: Insufficient documentation

## 2014-01-07 DIAGNOSIS — E669 Obesity, unspecified: Secondary | ICD-10-CM | POA: Insufficient documentation

## 2014-01-07 DIAGNOSIS — K3184 Gastroparesis: Secondary | ICD-10-CM | POA: Insufficient documentation

## 2014-01-07 DIAGNOSIS — R079 Chest pain, unspecified: Secondary | ICD-10-CM

## 2014-01-07 LAB — CBC WITH DIFFERENTIAL/PLATELET
Basophils Absolute: 0 10*3/uL (ref 0.0–0.1)
Basophils Relative: 0 % (ref 0–1)
EOS PCT: 1 % (ref 0–5)
Eosinophils Absolute: 0.1 10*3/uL (ref 0.0–0.7)
HEMATOCRIT: 40 % (ref 36.0–46.0)
Hemoglobin: 12.8 g/dL (ref 12.0–15.0)
LYMPHS ABS: 3.1 10*3/uL (ref 0.7–4.0)
Lymphocytes Relative: 44 % (ref 12–46)
MCH: 27.8 pg (ref 26.0–34.0)
MCHC: 32 g/dL (ref 30.0–36.0)
MCV: 86.8 fL (ref 78.0–100.0)
MONO ABS: 0.7 10*3/uL (ref 0.1–1.0)
Monocytes Relative: 9 % (ref 3–12)
Neutro Abs: 3.2 10*3/uL (ref 1.7–7.7)
Neutrophils Relative %: 46 % (ref 43–77)
Platelets: 249 10*3/uL (ref 150–400)
RBC: 4.61 MIL/uL (ref 3.87–5.11)
RDW: 14.4 % (ref 11.5–15.5)
WBC: 7.1 10*3/uL (ref 4.0–10.5)

## 2014-01-07 LAB — PRO B NATRIURETIC PEPTIDE: Pro B Natriuretic peptide (BNP): 1495 pg/mL — ABNORMAL HIGH (ref 0–125)

## 2014-01-07 LAB — BASIC METABOLIC PANEL
ANION GAP: 14 (ref 5–15)
BUN: 19 mg/dL (ref 6–23)
CALCIUM: 9.3 mg/dL (ref 8.4–10.5)
CO2: 23 mEq/L (ref 19–32)
CREATININE: 0.87 mg/dL (ref 0.50–1.10)
Chloride: 103 mEq/L (ref 96–112)
GFR calc non Af Amer: 69 mL/min — ABNORMAL LOW (ref >90)
GFR, EST AFRICAN AMERICAN: 80 mL/min — AB (ref >90)
Glucose, Bld: 104 mg/dL — ABNORMAL HIGH (ref 70–99)
Potassium: 5.1 mEq/L (ref 3.7–5.3)
Sodium: 140 mEq/L (ref 137–147)

## 2014-01-07 LAB — TROPONIN I

## 2014-01-07 MED ORDER — ONDANSETRON 4 MG PO TBDP
4.0000 mg | ORAL_TABLET | Freq: Once | ORAL | Status: AC
  Administered 2014-01-07: 4 mg via ORAL
  Filled 2014-01-07: qty 1

## 2014-01-07 MED ORDER — MORPHINE SULFATE 2 MG/ML IJ SOLN: 2.0000 mg | Freq: Once | INTRAMUSCULAR | Status: DC

## 2014-01-07 MED ORDER — ONDANSETRON HCL 4 MG/2ML IJ SOLN: 4.0000 mg | Freq: Once | INTRAMUSCULAR | Status: DC

## 2014-01-07 MED ORDER — GI COCKTAIL ~~LOC~~
30.0000 mL | Freq: Once | ORAL | Status: AC
  Administered 2014-01-07: 30 mL via ORAL
  Filled 2014-01-07: qty 30

## 2014-01-07 NOTE — ED Provider Notes (Signed)
CSN: [id number]     Arrival date & time 01/07/14  1919 History   First MD Initiated Contact with Patient 01/07/14 1948     Chief Complaint  Patient presents with  . Chest Pain     (Consider location/radiation/quality/duration/timing/severity/associated sxs/prior Treatment) Patient is a 63 y.o. female presenting with chest pain. The history is provided by the patient.  Chest Pain Pain location:  Substernal area Associated symptoms: nausea and vomiting   Associated symptoms: no abdominal pain, no back pain, no headache, no numbness, no shortness of breath and no weakness    patient presents with chest pain. It is in her mid chest. As dull. It began at home. Minimal shortness of breath. She's had a history of same. No swelling or legs. She's had chronic heart failure scheduled for right-sided heart cath since. She has a history of paroxysmal A. Fib. She is on anticoagulation. Pain is somewhat improved. No headache. No confusion. No nausea. No diaphoresis. Patient had nausea and vomited about an hour prior to the start of the chest pain.  Past Medical History  Diagnosis Date  . Chest pain     Negative cardiac catheterization in 2002; negative stress nuclear study in 2008  . Congestive heart failure     Normal LV systolic function  . PAF (paroxysmal atrial fibrillation) 2009  . Chronic anticoagulation   . Diabetes mellitus, type 2     Insulin therapy; exacerbated by prednisone  . Syncope     Admitted 05/2009; magnetic resonance imagin/ MRA - negative; etiology thought to be orthostasis secondary to drugs and dehydration  . GERD (gastroesophageal reflux disease)   . IBS (irritable bowel syndrome)   . Hiatal hernia   . Gastroparesis     99% retention 05/2008 on GES  . Hyperlipidemia   . Hypertension   . Hypothyroid   . Chronic LBP     Surgical intervention in 1996  . Obesity   . OSA on CPAP   . Anemia     H/H of 10/30 with a normal MCV in 12/09  . Barrett's esophagus     Diagnosed  1995. Last EGD 2008.   . Seizures    Past Surgical History  Procedure Laterality Date  . Cardiovascular stress test  2008    Stress nuclear study  . Back surgery  1996  . Laparoscopic cholecystectomy  1990s  . Total abdominal hysterectomy  1999  . Laminectomy  1995    L4-L5  . Carpal tunnel release  1994  . Esophagogastroduodenoscopy  2008    Barrett's without dysplasia. esphagus dilated. antral erosions, h.pylori serologies negative.  . Colonoscopy  11/2011    Dr. Darrick Penna: Internal hemorrhoids, mild diverticulosis. Random colon biopsies negative.  . Givens capsule study  12/07/2011    Proximal small bowel, rare AVM. Distal small bowel, multiple ulcers noted  . Cardiac catheterization  2002  . Esophagogastroduodenoscopy  11/2011    Dr. Darrick Penna: Barrett's esophagus, mild gastritis, diverticulum in the second portion of the duodenum repeat EGD 3 years. Small bowel biopsies negative. Gastric biopsy show reactive gastropathy but no H. pylori. Esophageal biopsies consistent with GERD.  [employer] capsule study N/A 09/27/2013    Procedure: GIVENS CAPSULE STUDY;  Surgeon: West Bali, MD;  Location: [facility];  Service: Endoscopy;  Laterality: N/A;  7:30  . Givens capsule study N/A 10/10/2013    Procedure: GIVENS CAPSULE STUDY;  Surgeon: West Bali, MD;  [facility]: [facility];  Service: Endoscopy;  Laterality:  N/A;  7:30  . Colonoscopy N/A 11/08/2013    Procedure: COLONOSCOPY WITH ILEOSCOPY;  Surgeon: West BaliSandi L Fields, MD;  Location: [facility];  Service: Endoscopy;  Laterality: N/A;  9:30 AM  . Esophageal biopsy N/A 11/08/2013    Procedure: BIOPSY  / Tissue sampling / ulcers present in small intestine;  Surgeon: West BaliSandi L Fields, MD;  [facility]: [facility];  Service: Endoscopy;  Laterality: N/A;   Family History  Problem Relation Age of Onset  . Hypertension Mother   . Alzheimer's disease Mother   . Stroke Mother   . Heart attack Mother   . Heart disease Neg Hx   . Hypertension Other    . Colon cancer Neg Hx    History  Substance Use Topics  . Smoking status: Former Smoker -- 15 years    Quit date: 07/01/1983  . Smokeless tobacco: Never Used     Comment: Remote  . Alcohol Use: No     Comment: last etoh one year ago, never frequent   OB History   Grav Para Term Preterm Abortions TAB SAB Ect Mult Living   2 2 2             Review of Systems  Constitutional: Negative for activity change and appetite change.  Eyes: Negative for pain.  Respiratory: Negative for chest tightness and shortness of breath.   Cardiovascular: Positive for chest pain. Negative for leg swelling.  Gastrointestinal: Positive for nausea and vomiting. Negative for abdominal pain and diarrhea.  Genitourinary: Negative for flank pain.  Musculoskeletal: Negative for back pain and neck stiffness.  Skin: Negative for rash.  Neurological: Negative for weakness, numbness and headaches.  Psychiatric/Behavioral: Negative for behavioral problems.      Allergies  Ibuprofen; Celexa; and Codeine  Home Medications   Prior to Admission medications   Medication Sig Start Date End Date Taking? Authorizing Provider  apixaban (ELIQUIS) 5 MG TABS tablet Take 1 tablet (5 mg total) by mouth 2 (two) times daily. 12/16/13  Yes Antoine PocheJonathan F Branch, MD  baclofen (LIORESAL) 10 MG tablet Take 10 mg by mouth 2 (two) times daily. 11/15/13  Yes Historical Provider, MD  diltiazem (CARDIZEM CD) 240 MG 24 hr capsule Take 240 mg by mouth every evening. 07/04/13  Yes Milana ObeyStephen D Knowlton, MD  furosemide (LASIX) 40 MG tablet Take 20 mg by mouth daily.  12/03/13  Yes Historical Provider, MD  glipiZIDE (GLUCOTROL) 10 MG tablet Take 10 mg by mouth 2 (two) times daily.    Yes Historical Provider, MD  HYDROcodone-acetaminophen (NORCO/VICODIN) 5-325 MG per tablet Take 2 tablets by mouth every 6 (six) hours as needed for moderate pain.   Yes Historical Provider, MD  Insulin Glargine (LANTUS SOLOSTAR) 100 UNIT/ML Solostar Pen Inject 22 Units  into the skin at bedtime.    Yes Historical Provider, MD  levETIRAcetam (KEPPRA) 250 MG tablet Take 1 tablet (250 mg total) by mouth 2 (two) times daily. 11/10/12  Yes Milana ObeyStephen D Knowlton, MD  levothyroxine (SYNTHROID, LEVOTHROID) 125 MCG tablet Take 125 mcg by mouth daily.   Yes Historical Provider, MD  LORazepam (ATIVAN) 1 MG tablet Take 1-2 mg by mouth daily as needed for anxiety or sleep. For anxiety   Yes Historical Provider, MD  metFORMIN (GLUCOPHAGE) 500 MG tablet Take 1,000 mg by mouth 2 (two) times daily with a meal.    Yes Historical Provider, MD  metoCLOPramide (REGLAN) 10 MG tablet Take 10 mg by mouth 4 (four) times daily.  09/20/12  Yes Historical Provider, MD  metoprolol (LOPRESSOR) 100 MG tablet Take 100 mg by mouth 2 (two) times daily.   Yes Historical Provider, MD  omeprazole (PRILOSEC) 20 MG capsule Take 20 mg by mouth 2 (two) times daily.     Yes Historical Provider, MD  ondansetron (ZOFRAN-ODT) 8 MG disintegrating tablet Take 8 mg by mouth daily as needed for nausea or vomiting.   Yes Historical Provider, MD  potassium chloride SA (K-DUR,KLOR-CON) 20 MEQ tablet Take 20 mEq by mouth 3 (three) times daily.    Yes Historical Provider, MD  simvastatin (ZOCOR) 40 MG tablet Take 40 mg by mouth at bedtime.    Yes Historical Provider, MD  traMADol (ULTRAM) 50 MG tablet Take 1 tablet (50 mg total) by mouth every 6 (six) hours as needed. 12/29/13  Yes Rolland Porter, MD  nitroGLYCERIN (NITROSTAT) 0.4 MG SL tablet Place 0.4 mg under the tongue every 5 (five) minutes as needed for chest pain. For chest pain     Historical Provider, MD   BP 110/91  Pulse 98  Temp(Src) 97.6 F (36.4 C) (Oral)  Resp 17  Ht 5\' 8"  (1.727 m)  Wt 186 lb (84.369 kg)  BMI 28.29 kg/m2  SpO2 99% Physical Exam  Nursing note and vitals reviewed. Constitutional: She is oriented to person, place, and time. She appears well-developed and well-nourished.  HENT:  Head: Normocephalic and atraumatic.  Eyes: EOM are normal.  Pupils are equal, round, and reactive to light.  Neck: Normal range of motion. Neck supple. JVD present.  Mild JVD  Cardiovascular: Normal rate, regular rhythm and normal heart sounds.   No murmur heard. Pulmonary/Chest: Effort normal and breath sounds normal. No respiratory distress. She has no wheezes. She has no rales.  Abdominal: Soft. Bowel sounds are normal. She exhibits no distension. There is no tenderness. There is no rebound and no guarding.  Musculoskeletal: Normal range of motion. She exhibits edema.  Mild bilateral LE pitting edema  Neurological: She is alert and oriented to person, place, and time. No cranial nerve deficit.  Skin: Skin is warm and dry.  Psychiatric: She has a normal mood and affect. Her speech is normal.    ED Course  Procedures (including critical care time) Labs Review Labs Reviewed  PRO B NATRIURETIC PEPTIDE - Abnormal; Notable for the following:    Pro B Natriuretic peptide (BNP) 1495.0 (*)    All other components within normal limits  BASIC METABOLIC PANEL - Abnormal; Notable for the following:    Glucose, Bld 104 (*)    GFR calc non Af Amer 69 (*)    GFR calc Af Amer 80 (*)    All other components within normal limits  CBC WITH DIFFERENTIAL  TROPONIN I    Imaging Review Dg Chest 2 View  01/07/2014   CLINICAL DATA:  Chest pain, hypertension  EXAM: CHEST  2 VIEW  COMPARISON:  01/03/2014  FINDINGS: Cardiomegaly. Mitral valve annular calcifications are noted. Stable elevation of the left hemidiaphragm. No confluent opacities or effusions. No acute bony abnormality.  IMPRESSION: Cardiomegaly.  No active disease.   Electronically Signed   By: Charlett Nose M.D.   On: 01/07/2014 20:29     EKG Interpretation None      Date: 01/07/2014  Rate:70  Rhythm: atrial fibrillation  QRS Axis: normal  Intervals: afib  ST/T Wave abnormalities: nonspecific ST/T changes  Conduction Disutrbances:afib  Narrative Interpretation:   Old EKG Reviewed:  unchanged   MDM   Final diagnoses:  Chest pain, unspecified chest pain type  Paroxysmal atrial fibrillation    Patient with chest pain. Recently workup for same. EKG stable. BNP is mildly elevated, but lower than previous. May be due to the vomiting and esophageal irritation. Will discharge home. Will followup with PCP and cardiology.    Juliet Rude. Rubin Payor, MD 01/07/14 2206

## 2014-01-07 NOTE — ED Notes (Signed)
Pt states she was in the middle of taking her nightly medication when she became nauseous and then began having cheat pain, pt states she has not eaten anything since 3pm today

## 2014-01-07 NOTE — Discharge Instructions (Signed)

## 2014-01-09 ENCOUNTER — Encounter (HOSPITAL_COMMUNITY): Payer: Self-pay | Admitting: Pharmacy Technician

## 2014-01-10 ENCOUNTER — Encounter (HOSPITAL_COMMUNITY)
Admission: RE | Disposition: A | Discharge status: Home / Self Care | Payer: Self-pay | Source: Ambulatory Visit | Attending: Interventional Cardiology

## 2014-01-10 ENCOUNTER — Ambulatory Visit (HOSPITAL_COMMUNITY)
Admission: RE | Admit: 2014-01-10 | Discharge: 2014-01-10 | Disposition: A | Discharge status: Home / Self Care | Payer: Medicare Other | Source: Ambulatory Visit | Attending: Interventional Cardiology | Admitting: Interventional Cardiology

## 2014-01-10 DIAGNOSIS — D509 Iron deficiency anemia, unspecified: Secondary | ICD-10-CM | POA: Insufficient documentation

## 2014-01-10 DIAGNOSIS — I10 Essential (primary) hypertension: Secondary | ICD-10-CM | POA: Diagnosis not present

## 2014-01-10 DIAGNOSIS — I2789 Other specified pulmonary heart diseases: Secondary | ICD-10-CM | POA: Insufficient documentation

## 2014-01-10 DIAGNOSIS — Z9119 Patient's noncompliance with other medical treatment and regimen: Secondary | ICD-10-CM | POA: Diagnosis not present

## 2014-01-10 DIAGNOSIS — Z91199 Patient's noncompliance with other medical treatment and regimen due to unspecified reason: Secondary | ICD-10-CM | POA: Insufficient documentation

## 2014-01-10 DIAGNOSIS — R072 Precordial pain: Secondary | ICD-10-CM

## 2014-01-10 DIAGNOSIS — G4730 Sleep apnea, unspecified: Secondary | ICD-10-CM

## 2014-01-10 DIAGNOSIS — E039 Hypothyroidism, unspecified: Secondary | ICD-10-CM | POA: Insufficient documentation

## 2014-01-10 DIAGNOSIS — Z794 Long term (current) use of insulin: Secondary | ICD-10-CM | POA: Diagnosis not present

## 2014-01-10 DIAGNOSIS — E119 Type 2 diabetes mellitus without complications: Secondary | ICD-10-CM | POA: Diagnosis not present

## 2014-01-10 DIAGNOSIS — I5032 Chronic diastolic (congestive) heart failure: Secondary | ICD-10-CM | POA: Insufficient documentation

## 2014-01-10 DIAGNOSIS — Z7901 Long term (current) use of anticoagulants: Secondary | ICD-10-CM | POA: Insufficient documentation

## 2014-01-10 DIAGNOSIS — K219 Gastro-esophageal reflux disease without esophagitis: Secondary | ICD-10-CM | POA: Diagnosis not present

## 2014-01-10 DIAGNOSIS — E785 Hyperlipidemia, unspecified: Secondary | ICD-10-CM | POA: Insufficient documentation

## 2014-01-10 DIAGNOSIS — K589 Irritable bowel syndrome without diarrhea: Secondary | ICD-10-CM | POA: Insufficient documentation

## 2014-01-10 DIAGNOSIS — E669 Obesity, unspecified: Secondary | ICD-10-CM | POA: Insufficient documentation

## 2014-01-10 DIAGNOSIS — R569 Unspecified convulsions: Secondary | ICD-10-CM | POA: Diagnosis not present

## 2014-01-10 DIAGNOSIS — R0600 Dyspnea, unspecified: Secondary | ICD-10-CM

## 2014-01-10 DIAGNOSIS — R079 Chest pain, unspecified: Secondary | ICD-10-CM | POA: Diagnosis present

## 2014-01-10 DIAGNOSIS — I4891 Unspecified atrial fibrillation: Secondary | ICD-10-CM | POA: Insufficient documentation

## 2014-01-10 DIAGNOSIS — I279 Pulmonary heart disease, unspecified: Secondary | ICD-10-CM

## 2014-01-10 DIAGNOSIS — I482 Chronic atrial fibrillation, unspecified: Secondary | ICD-10-CM

## 2014-01-10 DIAGNOSIS — Z6828 Body mass index (BMI) 28.0-28.9, adult: Secondary | ICD-10-CM | POA: Diagnosis not present

## 2014-01-10 HISTORY — PX: RIGHT HEART CATHETERIZATION: SHX5447

## 2014-01-10 HISTORY — PX: LEFT HEART CATHETERIZATION WITH CORONARY ANGIOGRAM: SHX5451

## 2014-01-10 LAB — POCT I-STAT 3, ART BLOOD GAS (G3+)
ACID-BASE DEFICIT: 2 mmol/L (ref 0.0–2.0)
BICARBONATE: 22.2 meq/L (ref 20.0–24.0)
O2 Saturation: 99 %
PCO2 ART: 34.8 mmHg — AB (ref 35.0–45.0)
TCO2: 23 mmol/L (ref 0–100)
pH, Arterial: 7.412 (ref 7.350–7.450)
pO2, Arterial: 135 mmHg — ABNORMAL HIGH (ref 80.0–100.0)

## 2014-01-10 LAB — GLUCOSE, CAPILLARY
GLUCOSE-CAPILLARY: 175 mg/dL — AB (ref 70–99)
Glucose-Capillary: 236 mg/dL — ABNORMAL HIGH (ref 70–99)
Glucose-Capillary: 202 mg/dL — ABNORMAL HIGH (ref 70–99)

## 2014-01-10 LAB — POCT I-STAT 3, VENOUS BLOOD GAS (G3P V)
ACID-BASE DEFICIT: 1 mmol/L (ref 0.0–2.0)
BICARBONATE: 24.2 meq/L — AB (ref 20.0–24.0)
O2 Saturation: 67 %
PCO2 VEN: 41 mmHg — AB (ref 45.0–50.0)
PH VEN: 7.38 — AB (ref 7.250–7.300)
PO2 VEN: 36 mmHg (ref 30.0–45.0)
TCO2: 25 mmol/L (ref 0–100)

## 2014-01-10 LAB — PROTIME-INR
INR: 1.04 (ref 0.00–1.49)
Prothrombin Time: 13.6 seconds (ref 11.6–15.2)

## 2014-01-10 SURGERY — RIGHT HEART CATH
Anesthesia: LOCAL

## 2014-01-10 MED ORDER — SODIUM CHLORIDE 0.9 % IV SOLN: 250.0000 mL | INTRAVENOUS | Status: DC | PRN

## 2014-01-10 MED ORDER — LIDOCAINE HCL (PF) 1 % IJ SOLN
INTRAMUSCULAR | Status: AC
  Filled 2014-01-10: qty 30

## 2014-01-10 MED ORDER — HEPARIN (PORCINE) IN NACL 2-0.9 UNIT/ML-% IJ SOLN
INTRAMUSCULAR | Status: AC
Start: 2014-01-10 — End: 2014-01-10
  Filled 2014-01-10: qty 500

## 2014-01-10 MED ORDER — ACETAMINOPHEN 325 MG PO TABS
ORAL_TABLET | ORAL | Status: AC
  Administered 2014-01-10: 650 mg via ORAL
  Filled 2014-01-10: qty 2

## 2014-01-10 MED ORDER — ACETAMINOPHEN 325 MG PO TABS
650.0000 mg | ORAL_TABLET | ORAL | Status: DC | PRN
  Administered 2014-01-10: 650 mg via ORAL

## 2014-01-10 MED ORDER — FENTANYL CITRATE 0.05 MG/ML IJ SOLN
INTRAMUSCULAR | Status: AC
  Filled 2014-01-10: qty 2

## 2014-01-10 MED ORDER — SODIUM CHLORIDE 0.9 % IV SOLN
INTRAVENOUS | Status: DC
  Administered 2014-01-10: 11:00:00 via INTRAVENOUS

## 2014-01-10 MED ORDER — ASPIRIN 81 MG PO CHEW
81.0000 mg | CHEWABLE_TABLET | ORAL | Status: AC
  Administered 2014-01-10: 81 mg via ORAL

## 2014-01-10 MED ORDER — SODIUM CHLORIDE 0.9 % IV SOLN: INTRAVENOUS | Status: AC

## 2014-01-10 MED ORDER — ONDANSETRON HCL 4 MG/2ML IJ SOLN
INTRAMUSCULAR | Status: AC
  Administered 2014-01-10: 4 mg
  Filled 2014-01-10: qty 2

## 2014-01-10 MED ORDER — HEPARIN (PORCINE) IN NACL 2-0.9 UNIT/ML-% IJ SOLN
INTRAMUSCULAR | Status: AC
  Filled 2014-01-10: qty 1000

## 2014-01-10 MED ORDER — SODIUM CHLORIDE 0.9 % IJ SOLN
3.0000 mL | Freq: Two times a day (BID) | INTRAMUSCULAR | Status: DC
  Administered 2014-01-10: 3 mL via INTRAVENOUS

## 2014-01-10 MED ORDER — DILTIAZEM HCL 25 MG/5ML IV SOLN
5.0000 mg | INTRAVENOUS | Status: DC
  Filled 2014-01-10: qty 5

## 2014-01-10 MED ORDER — SODIUM CHLORIDE 0.9 % IJ SOLN: 3.0000 mL | INTRAMUSCULAR | Status: DC | PRN

## 2014-01-10 MED ORDER — MIDAZOLAM HCL 2 MG/2ML IJ SOLN
INTRAMUSCULAR | Status: AC
  Filled 2014-01-10: qty 2

## 2014-01-10 MED ORDER — ASPIRIN 81 MG PO CHEW
CHEWABLE_TABLET | ORAL | Status: AC
  Filled 2014-01-10: qty 1

## 2014-01-10 NOTE — CV Procedure (Signed)
     Left and Right Heart Catheterization with Coronary Angiography  Report  [name]  63 y.o.  female [birth year]  Procedure Date: 01/10/2014 Referring Physician: Dina Rich, M.D. Primary Cardiologist: Dina Rich, M.D.  INDICATIONS: Recurrent episodes of chest pain with multiple recent emergency room visits. Recent echo suggesting pulmonary artery hypertension with systolic pressures greater than 80 mmHg  PROCEDURE: 1. Right heart catheterization; 2. Left heart catheterization; 3. Coronary angiography; 4. Left ventriculography  CONSENT:  The risks, benefits, and details of the procedure were explained in detail to the patient. Risks including death, stroke, heart attack, kidney injury, allergy, limb ischemia, bleeding and radiation injury were discussed.  The patient verbalized understanding and wanted to proceed.  Informed written consent was obtained.  PROCEDURE TECHNIQUE:  After Xylocaine anesthesia a 7 French sheath was placed in the right femoral vein and 5 French sheath in the right femoral artery both using the Seldinger technique.  Coronary angiography was done using a 5 F Judkins left and right 3.5 and 4 cm catheters respectively. Left ventriculography was done using the JR 4 catheter and hand injection.   The patient did not take any of her medications prior to coming to the cath lab. Atrial fibrillation rate was significantly elevated. 10 mg of IV Lopressor was given in the Cath Lab to get her ventricular response less than 100 beats per minute.  No complications occurred   CONTRAST:  Total of 70 cc.  COMPLICATIONS:  None   HEMODYNAMICS:  Aortic pressure 123/73 mmHg; LV pressure 122/6 mmHg; LVEDP 15 mm mercury; RA 4 mmHg; RV 31/5; PA 38/13 mmHg; PCWP(mean) unable to wedge the balloon tip catheter; Cardiac Output by Fick 4.73 L per minute  ANGIOGRAPHIC DATA:   The left main coronary artery is normal.  The left anterior descending artery is widely patent,  traverses the LV apex, gives origin to 2 diagonals, and has no significant obstruction..  The left circumflex artery is widely patent and gives a single large obtuse marginal branch that trifurcates on the lateral wall. No significant obstruction.  The right coronary artery is dominant, gives 3 left ventricular branches and a large bifurcating PDA. No obstructions noted.Marland Kitchen  LEFT VENTRICULOGRAM:  Left ventricular angiogram was done in the 30 RAO projection and revealed normal LV cavity size, tachycardia due to atrial fibrillation, and mild global reduction in LV function with an estimated ejection fraction of 40%   IMPRESSIONS:  1. Widely patent coronary arteries 2. Mild pulmonary hypertension 3. Mildly depressed LV systolic function with an estimated EF of 40% 3. This did not confirm significant pulmonary hypertension. The procedure did suggest that the right atrium and right ventricle were enlarged as we had difficulty manipulating the right heart catheter into the pulmonary artery.   RECOMMENDATION:  Back to the outpatient area at home later today if no complications. She should not resume eloquence until 24 hours post procedure.Marland Kitchen

## 2014-01-10 NOTE — H&P (View-Only) (Signed)
HPI: [name] is a 63 year old patient of Dr. Wyline [name] will follow for ongoing assessment and management of hypertension, atrial fibrillation on Eliquis, chronic diastolic dysfunction, iron deficient anemia, who was recently admitted to [name] in the setting of acute decompensated diastolic heart failure with complaints of severe dyspnea. The patient is diuresis with IV Lasix, she had a history of medical noncompliance. On discharge she weighed 196 pounds. She was continued on medications prior to her admission. She is being seen on followup  During hospitalization echocardiogram was completed revealing stable LV systolic function with no new wall motion abnormalities. She was not scheduled for stress test as it was felt that her discomfort in her chest is atypical. It is noted to have some pulmonary hypertension, which was likely related to left sided heart failure. Consideration for right heart catheterization was recommended on followup. PA pressure was elevated at 85 mm mercury. There was also moderate tricuspid regurg. As recommended by Dr. Wyline [name] on review of echocardiogram that the patient be scheduled for right heart catheterization.  Since being discharged from the [name] the patient has continued to loose weight, she has no appetite, she has some dizziness with standing, but denies any bleeding or chest discomfort. She is tolerating a takes up and without complaint of stomach upset. She continues to have some issues with breathing New York Heart Association class 2 symptoms. Also states that her blood glucoses the lower than normal in the mornings. Running around the 102.  She has not yet seen her primary care physician Dr. Sudie [name] since discharge.   Allergies  Allergen Reactions  . Ibuprofen     Upsets her stomach and causes abdominal pain  . Celexa [Citalopram Hydrobromide]   . Codeine Nausea And Vomiting    HALLUCINATIONS    Current Outpatient Prescriptions    Medication Sig Dispense Refill  . apixaban (ELIQUIS) 5 MG TABS tablet Take 1 tablet (5 mg total) by mouth 2 (two) times daily.  60 tablet  6  . baclofen (LIORESAL) 10 MG tablet Take 10 mg by mouth 2 (two) times daily.      Marland Kitchen. diltiazem (CARDIZEM CD) 240 MG 24 hr capsule Take 240 mg by mouth every evening.      . furosemide (LASIX) 40 MG tablet Take 40 mg by mouth daily.      Marland Kitchen. glipiZIDE (GLUCOTROL) 10 MG tablet Take 10 mg by mouth 2 (two) times daily.       Marland Kitchen. HYDROcodone-acetaminophen (NORCO) 10-325 MG per tablet Take 1 tablet by mouth 2 (two) times daily as needed for moderate pain or severe pain.       . Insulin Glargine (LANTUS SOLOSTAR) 100 UNIT/ML Solostar Pen Inject 22 Units into the skin at bedtime.       . levETIRAcetam (KEPPRA) 250 MG tablet Take 1 tablet (250 mg total) by mouth 2 (two) times daily.  60 tablet  11  . levothyroxine (SYNTHROID, LEVOTHROID) 125 MCG tablet Take 125 mcg by mouth daily.      Marland Kitchen. loperamide (IMODIUM) 2 MG capsule Take 4 mg by mouth as needed for diarrhea or loose stools.       Marland Kitchen. LORazepam (ATIVAN) 1 MG tablet Take 1-2 mg by mouth daily as needed for anxiety or sleep. For anxiety      . metFORMIN (GLUCOPHAGE) 500 MG tablet Take 1,000 mg by mouth 2 (two) times daily with a meal.       . metoCLOPramide (REGLAN) 10 MG tablet Take  10 mg by mouth 4 (four) times daily.       . metoprolol (LOPRESSOR) 100 MG tablet Take 100 mg by mouth 2 (two) times daily.      . nitroGLYCERIN (NITROSTAT) 0.4 MG SL tablet Place 0.4 mg under the tongue every 5 (five) minutes as needed for chest pain. For chest pain       . omeprazole (PRILOSEC) 20 MG capsule Take 20 mg by mouth 2 (two) times daily.        . ondansetron (ZOFRAN) 8 MG tablet Take 8 mg by mouth daily as needed. nausea      . potassium chloride SA (K-DUR,KLOR-CON) 20 MEQ tablet Take 20 mEq by mouth 3 (three) times daily.       . simvastatin (ZOCOR) 40 MG tablet Take 40 mg by mouth at bedtime.        No current  facility-administered medications for this visit.    Past Medical History  Diagnosis Date  . Chest pain     Negative cardiac catheterization in 2002; negative stress nuclear study in 2008  . Congestive heart failure     Normal LV systolic function  . PAF (paroxysmal atrial fibrillation) 2009  . Chronic anticoagulation   . Diabetes mellitus, type 2     Insulin therapy; exacerbated by prednisone  . Syncope     Admitted 05/2009; magnetic resonance imagin/ MRA - negative; etiology thought to be orthostasis secondary to drugs and dehydration  . GERD (gastroesophageal reflux disease)   . IBS (irritable bowel syndrome)   . Hiatal hernia   . Gastroparesis     99% retention 05/2008 on GES  . Hyperlipidemia   . Hypertension   . Hypothyroid   . Chronic LBP     Surgical intervention in 1996  . Obesity   . OSA on CPAP   . Anemia     H/H of 10/30 with a normal MCV in 12/09  . Barrett's esophagus     Diagnosed 1995. Last EGD 2008.   . Seizures     Past Surgical History  Procedure Laterality Date  . Cardiovascular stress test  2008    Stress nuclear study  . Back surgery  1996  . Laparoscopic cholecystectomy  1990s  . Total abdominal hysterectomy  1999  . Laminectomy  1995    L4-L5  . Carpal tunnel release  1994  . Esophagogastroduodenoscopy  2008    Barrett's without dysplasia. esphagus dilated. antral erosions, h.pylori serologies negative.  . Colonoscopy  11/2011    Dr. [facility]rick [name]: Internal hemorrhoids, mild diverticulosis. Random colon biopsies negative.  . Givens capsule study  12/07/2011    Proximal small bowel, rare AVM. Distal small bowel, multiple ulcers noted  . Cardiac catheterization  2002  . Esophagogastroduodenoscopy  11/2011    Dr. Darrick [name]: Barrett's esophagus, mild gastritis, diverticulum in the second portion of the duodenum repeat EGD 3 years. Small bowel biopsies negative. Gastric biopsy show reactive gastropathy but no H. pylori. Esophageal biopsies consistent with  GERD.  [employer] capsule study N/A 09/27/2013    Procedure: GIVENS CAPSULE STUDY;  Surgeon: West Bali, MD;  Location: [facility];  Service: Endoscopy;  Laterality: N/A;  7:30  . Givens capsule study N/A 10/10/2013    Procedure: GIVENS CAPSULE STUDY;  Surgeon: West Bali, MD;  Location: [facility];  Service: Endoscopy;  Laterality: N/A;  7:30  . Colonoscopy N/A 11/08/2013    Procedure: COLONOSCOPY WITH ILEOSCOPY;  Surgeon: West Bali, MD;  Location: [facility];  Service: Endoscopy;  Laterality: N/A;  9:30 AM  . Esophageal biopsy N/A 11/08/2013    Procedure: BIOPSY  / Tissue sampling / ulcers present in small intestine;  Surgeon: West Bali, MD;  Location: [facility];  Service: Endoscopy;  Laterality: N/A;    RNH:AFBXUX of systems complete and found to be negative unless listed above PHYSICAL EXAM BP 100/74  Pulse 48  Ht 5\' 8"  (1.727 m)  Wt 187 lb (84.823 kg)  BMI 28.44 kg/m2 General: Well developed, thin, frail,, in no acute distress Head: Eyes PERRLA, No xanthomas.   Normal cephalic and atramatic  Lungs: Clear bilaterally to auscultation and percussion. Heart: HRIR S1 S2, without MRG.  Pulses are 2+ & equal.            No carotid bruit. No JVD.  No abdominal bruits. No femoral bruits. Abdomen: Bowel sounds are positive, abdomen soft and non-tender without masses or                  Hernia's noted. Msk:  Back normal, normal gait. Normal strength and tone for age. Extremities: No clubbing, cyanosis or edema. Multiple varicosities are noted.  DP +1 Neuro: Alert and oriented X 3. Psych:  Good affect, responds appropriately  ASSESSMENT AND PLAN

## 2014-01-10 NOTE — Progress Notes (Signed)
Site area: right groin  Site Prior to Removal: 0 Level   Pressure Applied For 20 MINUTES    Manual:   yes  Patient Status During Pull:  No complaints  Post Pull Groin Site: 0 Level   Post Pull Instructions Given:  Abby RN/ cath lab holding  Post Pull Pulses Present: dp present  Dressing Applied:  tegaderm   Bedrest begins@1500   Comments pt has chronic back pain/ 7/10 with a goal of 2/10, tylenol 650 mg given, pt uses tylenol at home with good results.

## 2014-01-10 NOTE — Discharge Instructions (Signed)
Angiogram, Care After ° °Refer to this sheet in the next few weeks. These instructions provide you with information on caring for yourself after your procedure. Your health care provider may also give you more specific instructions. Your treatment has been planned according to current medical practices, but problems sometimes occur. Call your health care provider if you have any problems or questions after your procedure.  °WHAT TO EXPECT AFTER THE PROCEDURE °After your procedure, it is typical to have the following sensations: °· Minor discomfort or tenderness and a small bump at the catheter insertion site. The bump should usually decrease in size and tenderness within 1 to 2 weeks. °· Any bruising will usually fade within 2 to 4 weeks. °HOME CARE INSTRUCTIONS  °· You may need to keep taking blood thinners if they were prescribed for you. Only take over-the-counter or prescription medicines for pain, fever, or discomfort as directed by your health care provider. °· Do not apply powder or lotion to the site. °· Do not sit in a bathtub, swimming pool, or whirlpool for 5 to 7 days. °· You may shower 24 hours after the procedure. Remove the bandage (dressing) and gently wash the site with plain soap and water. Gently pat the site dry. °· Inspect the site at least twice daily. °· Limit your activity for the first 24 hours. Do not bend, squat, or lift anything over 10 lb (9 kg) or as directed by your health care provider. °· Do not drive home if you are discharged the day of the procedure. Have someone else drive you. Follow instructions about when you can drive or return to work. °SEEK MEDICAL CARE IF: °· You get lightheaded when standing up. °· You have drainage (other than a small amount of blood on the dressing). °· You have chills. °· You have a fever. °· You have redness, warmth, swelling, or pain at the insertion site. °SEEK IMMEDIATE MEDICAL CARE IF:  °· You develop chest pain or shortness of breath, feel faint,  or pass out. °· You have bleeding, swelling larger than a walnut, or drainage from the catheter insertion site. °· You develop pain, discoloration, coldness, or severe bruising in the leg or arm that held the catheter. °· You have heavy bleeding from the site. If this happens, hold pressure on the site. °MAKE SURE YOU: °· Understand these instructions. °· Will watch your condition. °· Will get help right away if you are not doing well or get worse. °Document Released: 01/06/2005 Document Revised: 06/25/2013 Document Reviewed: 11/12/2012 °ExitCare® Patient Information ©2015 [employer]. This information is not intended to replace advice given to you by your health care provider. Make sure you discuss any questions you have with your health care provider. ° °

## 2014-01-10 NOTE — Interval H&P Note (Signed)
Cath Lab Visit (complete for each Cath Lab visit)  Clinical Evaluation Leading to the Procedure:   ACS: No.  Non-ACS:    Anginal Classification: CCS III  Anti-ischemic medical therapy: Minimal Therapy (1 class of medications)  Non-Invasive Test Results: No non-invasive testing performed  Prior CABG: No previous CABG      History and Physical Interval Note:  01/10/2014 1:24 PM Last dose of Eliquis was 01/08/14 at 5P. Multiple recent ER visits for chest pain. Known severe pulmonary hypertension. Known DM . Plan right and left heart cath to exclude CAD an document coronary patency.   TORILYN BRAD  has presented today for surgery, with the diagnosis of pulmonary hypertension  The various methods of treatment have been discussed with the patient and family. After consideration of risks, benefits and other options for treatment, the patient has consented to  Procedure(s): RIGHT HEART CATH (N/A) as a surgical intervention .  The patient's history has been reviewed, patient examined, no change in status, stable for surgery.  I have reviewed the patient's chart and labs.  Questions were answered to the patient's satisfaction.     Lesleigh Noe

## 2014-01-16 IMAGING — CR DG CHEST 2V
2 series · 2 of 2 positions shown · non-contrast
Comparison: Chest radiograph performed 07/15/2009

CLINICAL DATA: Atrial fibrillation.  History of diabetes.

CHEST - 2 VIEW

[view not recorded (1 of 2)]
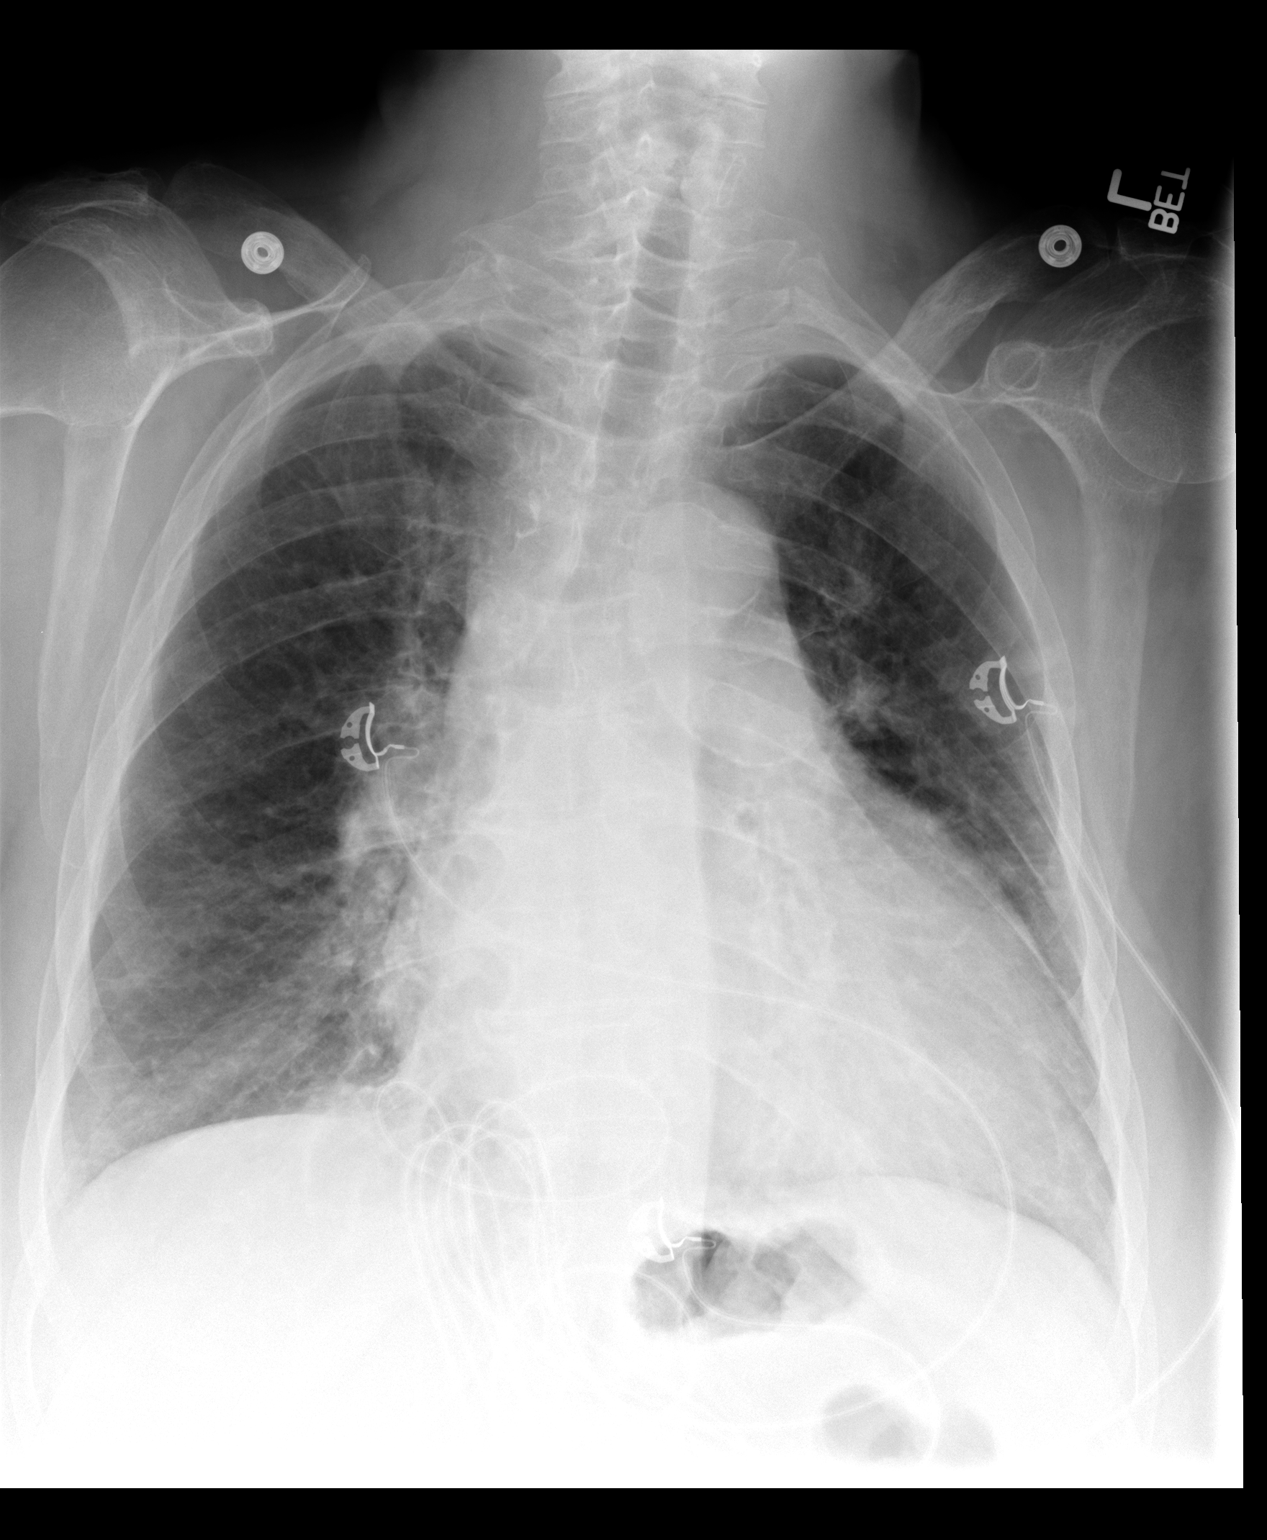

[view not recorded (2 of 2)]
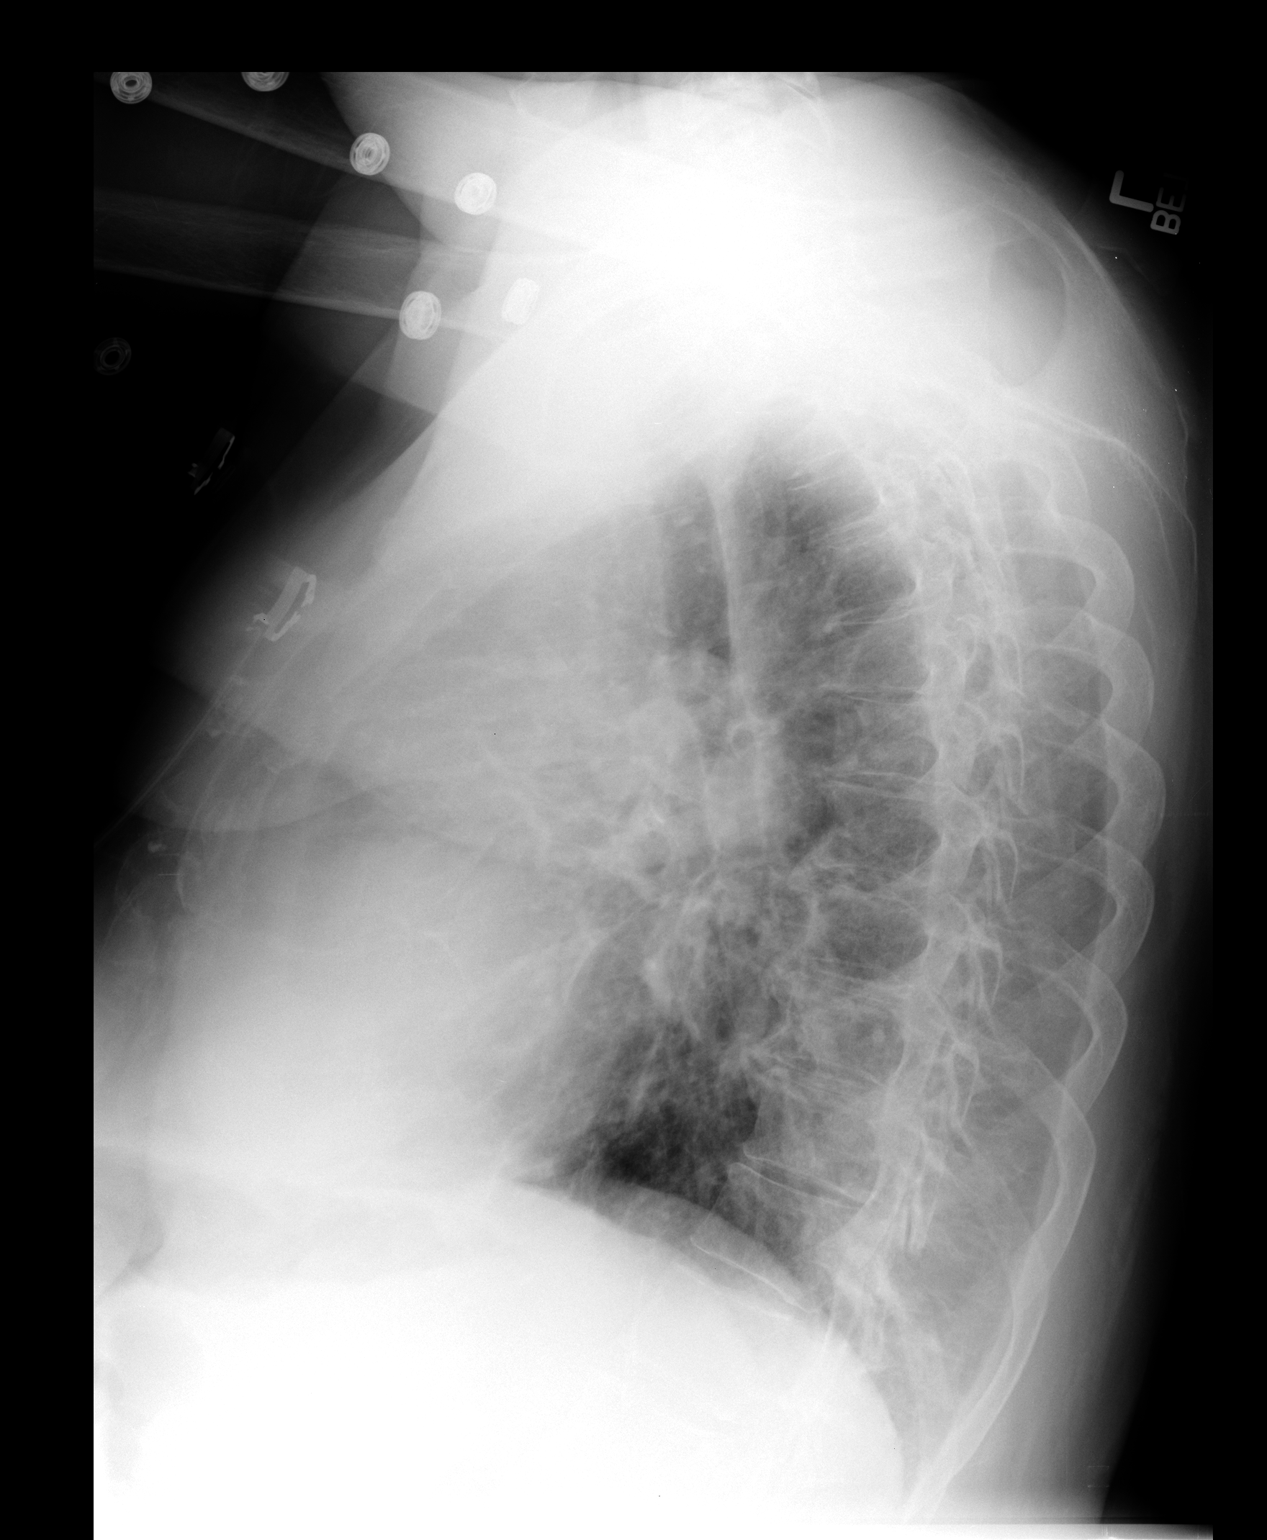

[2 of 2 positions shown; findings below may reference images not displayed]

FINDINGS: The lungs are well-aerated.  Vascular congestion is
noted, with mildly increased interstitial markings, raising concern
for minimal interstitial edema.  There is no evidence of pleural
effusion or pneumothorax.

The heart is mildly enlarged; calcification is noted along the
aortic arch and tracheobronchial tree.  No acute osseous
abnormalities are seen.
IMPRESSION: Vascular congestion and mild cardiomegaly, with mildly increased
interstitial markings, raising question for minimal interstitial
edema.

## 2014-01-20 ENCOUNTER — Telehealth: Payer: Self-pay | Admitting: Adult Health

## 2014-01-20 MED ORDER — METOPROLOL TARTRATE 100 MG PO TABS: 100.0000 mg | ORAL_TABLET | Freq: Two times a day (BID) | ORAL | Status: DC

## 2014-01-20 NOTE — Telephone Encounter (Signed)
Refill request complete 

## 2014-01-20 NOTE — Telephone Encounter (Signed)
Received fax refill request  Rx # [id number] Medication:  Metoprolol tab 100 mg Qty 60 Sig:  Take one tablet by mouth twice daily Physician:  Lyman Bishop

## 2014-01-29 IMAGING — CR DG CHEST 1V PORT
1 series · 1 of 1 positions shown · non-contrast
Comparison: 09/01/2011

CLINICAL DATA: Chest pain

PORTABLE CHEST - 1 VIEW

[view not recorded]
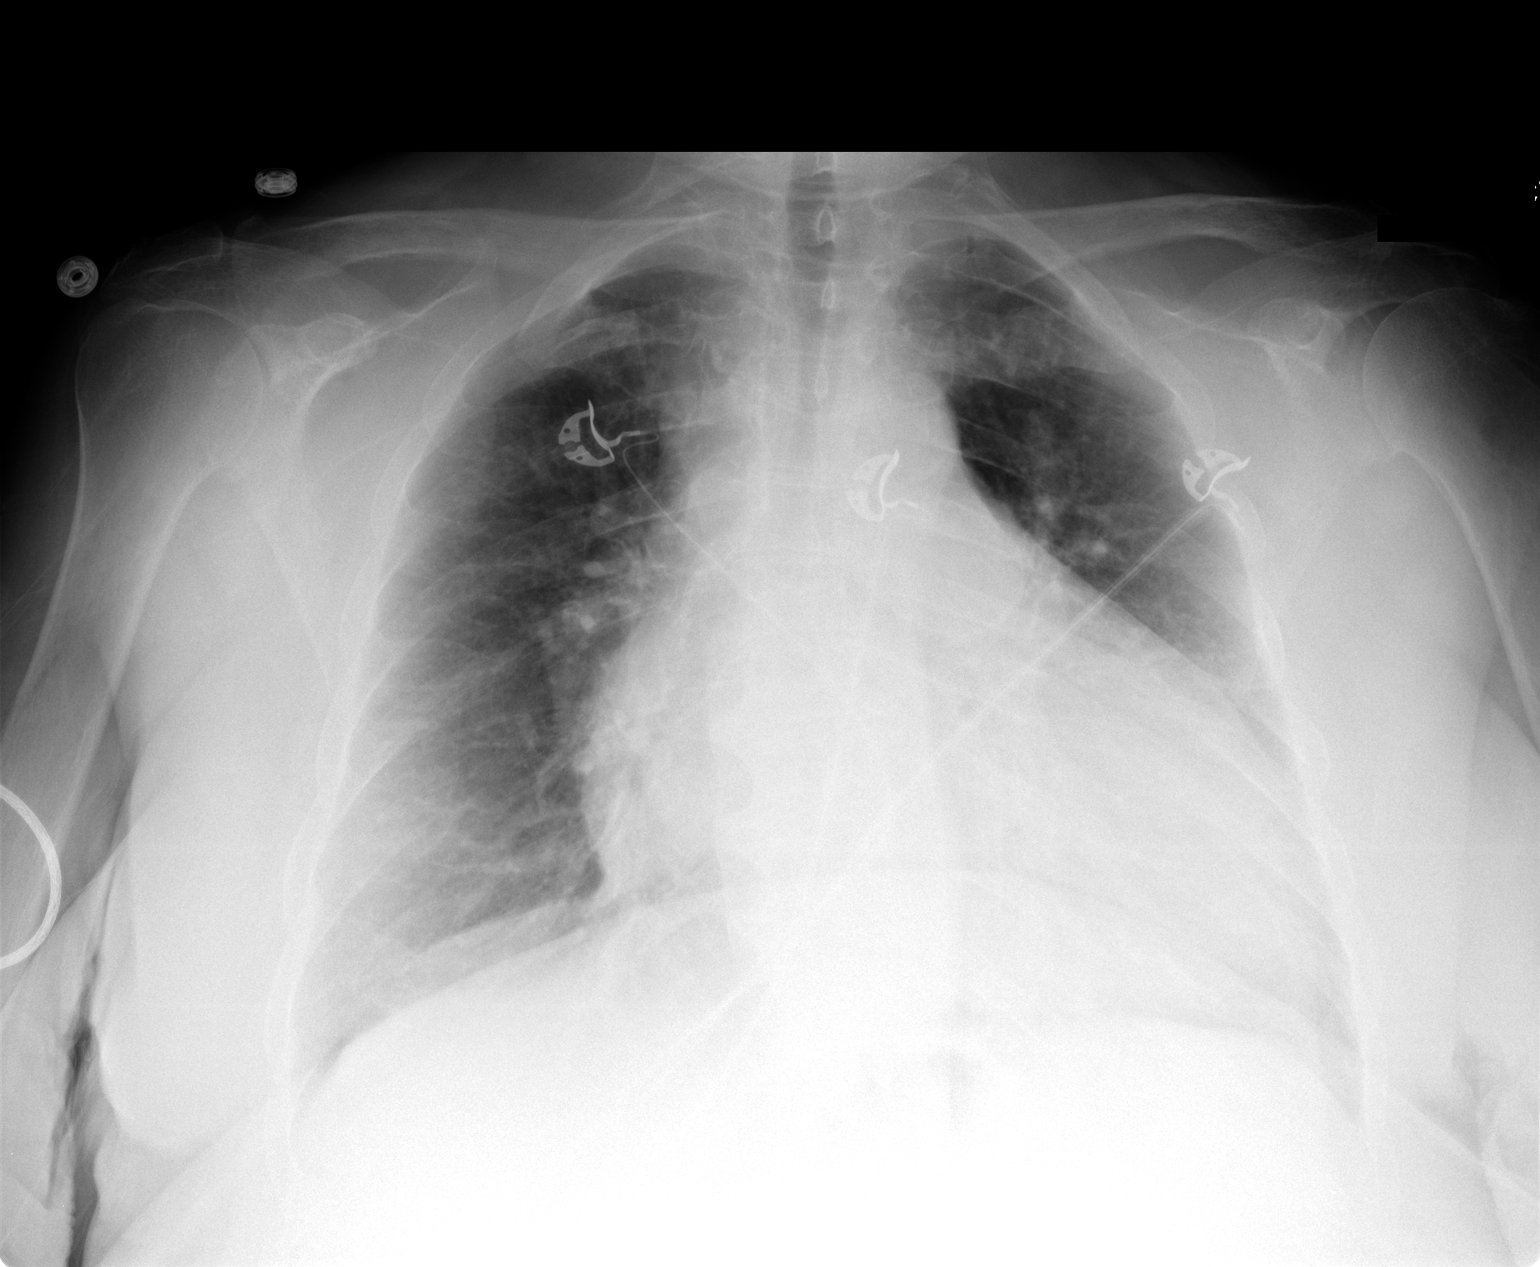

[1 of 1 positions shown; findings below may reference images not displayed]

FINDINGS: The heart is moderately enlarged.  Pulmonary vascularity
is within normal limits.  Lungs are grossly clear.  No
pneumothorax.  Low volumes.
IMPRESSION: Cardiomegaly without edema.

## 2014-02-21 ENCOUNTER — Ambulatory Visit: Payer: PRIVATE HEALTH INSURANCE | Admitting: Gastroenterology

## 2014-02-28 ENCOUNTER — Ambulatory Visit: Payer: Medicare Other | Admitting: Gastroenterology

## 2014-03-27 ENCOUNTER — Telehealth: Payer: Self-pay | Admitting: Gastroenterology

## 2014-03-27 DIAGNOSIS — IMO0001 Reserved for inherently not codable concepts without codable children: Secondary | ICD-10-CM | POA: Diagnosis not present

## 2014-03-27 DIAGNOSIS — G40309 Generalized idiopathic epilepsy and epileptic syndromes, not intractable, without status epilepticus: Secondary | ICD-10-CM | POA: Diagnosis not present

## 2014-03-27 DIAGNOSIS — E538 Deficiency of other specified B group vitamins: Secondary | ICD-10-CM | POA: Diagnosis not present

## 2014-03-27 DIAGNOSIS — F09 Unspecified mental disorder due to known physiological condition: Secondary | ICD-10-CM | POA: Diagnosis not present

## 2014-03-27 NOTE — Telephone Encounter (Signed)
SPOKE WITH DR. Gerilyn Pilgrim. REDUCED REGLAN DUE TO sX OF TARDIVE DYSKINESIA. NOW ON REGLAN 5 MG QAC AND HS. STARTED ON AN ANTIDEPRESSANT.

## 2014-03-31 ENCOUNTER — Ambulatory Visit (INDEPENDENT_AMBULATORY_CARE_PROVIDER_SITE_OTHER): Payer: Medicare Other | Admitting: Gastroenterology

## 2014-03-31 ENCOUNTER — Encounter: Payer: Self-pay | Admitting: Gastroenterology

## 2014-03-31 VITALS — BP 105/68 | HR 80 | Temp 97.1°F | Ht 65.0 in | Wt 169.4 lb

## 2014-03-31 DIAGNOSIS — Z79899 Other long term (current) drug therapy: Secondary | ICD-10-CM | POA: Diagnosis not present

## 2014-03-31 DIAGNOSIS — K2270 Barrett's esophagus without dysplasia: Secondary | ICD-10-CM | POA: Diagnosis not present

## 2014-03-31 DIAGNOSIS — R197 Diarrhea, unspecified: Secondary | ICD-10-CM

## 2014-03-31 DIAGNOSIS — G40309 Generalized idiopathic epilepsy and epileptic syndromes, not intractable, without status epilepticus: Secondary | ICD-10-CM | POA: Diagnosis not present

## 2014-03-31 DIAGNOSIS — R5381 Other malaise: Secondary | ICD-10-CM | POA: Diagnosis not present

## 2014-03-31 DIAGNOSIS — IMO0001 Reserved for inherently not codable concepts without codable children: Secondary | ICD-10-CM | POA: Diagnosis not present

## 2014-03-31 DIAGNOSIS — R634 Abnormal weight loss: Secondary | ICD-10-CM

## 2014-03-31 DIAGNOSIS — G259 Extrapyramidal and movement disorder, unspecified: Secondary | ICD-10-CM | POA: Diagnosis not present

## 2014-03-31 DIAGNOSIS — E538 Deficiency of other specified B group vitamins: Secondary | ICD-10-CM | POA: Diagnosis not present

## 2014-03-31 DIAGNOSIS — E559 Vitamin D deficiency, unspecified: Secondary | ICD-10-CM | POA: Diagnosis not present

## 2014-03-31 DIAGNOSIS — M6281 Muscle weakness (generalized): Secondary | ICD-10-CM | POA: Diagnosis not present

## 2014-03-31 DIAGNOSIS — K3184 Gastroparesis: Secondary | ICD-10-CM

## 2014-03-31 MED ORDER — DRONABINOL 2.5 MG PO CAPS
2.5000 mg | ORAL_CAPSULE | Freq: Two times a day (BID) | ORAL | Status: DC
Start: 2014-03-31 — End: 2014-04-11

## 2014-03-31 MED ORDER — PANCRELIPASE (LIP-PROT-AMYL) 25000 UNITS PO CPEP: ORAL_CAPSULE | ORAL | Status: DC

## 2014-03-31 NOTE — Progress Notes (Signed)
Primary Care Physician: [name], MD  Primary Gastroenterologist:  Jonette Eva, MD   Chief Complaint  Patient presents with  . Follow-up  . Nausea  . Diarrhea  . Abdominal Pain    HPI: [name] is a 63 y.o. female here for followup of chronic abdominal pain, diarrhea, nausea, gastroparesis, weight loss.  Last seen in the office in February 2015. At that time she dropped 25 pounds in 18 months. She has lost an additional 35 pounds. She has a history of Barrett's esophagus, gastroparesis, GERD, IBS, IDA. In March 2015 she underwent a second small bowel capsule endoscopy for history of small bowel ulcers. She was found to have distal small bowel ulcers extending to the terminal ileum. For this reason she underwent another colonoscopy she was noted to have normal mucosa in the terminal ileum, moderate diverticulosis, no obvious source for diarrhea, anemia, weight loss. Ileum biopsies were benign. Colon biopsies were benign. Next EGD for Barrett's esophagus surveillance to do in May of 2016.  Dr. Gerilyn Pilgrim spoke with Dr. Darrick Penna recently about patient's reglan due to evidence of tardive dyskinesia. He reduced her dose to milligrams 4 times a day. Patient reports that she has not noticed any worsening of her symptoms. She had notable tardive dyskinesia in the office today. She continues to complain of poor appetite. Denies heartburn. Vomiting every three days. BM 4 times per day, mostly loose to watery. No melena, brbpr. Lower abdominal cramps, zofran helps. Takes Zofran every 3rd day. Urine dark yellow. Tried Creon for about 24 hours but reports that it made her diarrhea worse so she stopped it. Daughter is concerned about patient not eating and drinking enough.   Heart cath 01/2014 for recurrent chest pain showed mild pulmonary HTN, mildly depressed LV systolic function of 40%.  Patient had a CT abdomen pelvis with contrast in March to further evaluate her weight loss. No  findings to explain abdominal pain or weight loss. Gastric emptying study in 2009 showed 99% retention at 2 hours.  Current Outpatient Prescriptions  Medication Sig Dispense Refill  . apixaban (ELIQUIS) 5 MG TABS tablet Take 1 tablet (5 mg total) by mouth 2 (two) times daily.  60 tablet  6  . diltiazem (CARDIZEM CD) 240 MG 24 hr capsule Take 240 mg by mouth every evening.      . DULoxetine (CYMBALTA) 60 MG capsule Take 60 mg by mouth daily.      . furosemide (LASIX) 40 MG tablet Take 20 mg by mouth daily. Three times a week      . glipiZIDE (GLUCOTROL) 10 MG tablet Take 10 mg by mouth daily before breakfast.       . HYDROcodone-acetaminophen (NORCO/VICODIN) 5-325 MG per tablet Take 2 tablets by mouth every 6 (six) hours as needed for moderate pain.      . Insulin Glargine (LANTUS SOLOSTAR) 100 UNIT/ML Solostar Pen Inject 22 Units into the skin at bedtime.       . levETIRAcetam (KEPPRA) 250 MG tablet Take 1 tablet (250 mg total) by mouth 2 (two) times daily.  60 tablet  11  . levothyroxine (SYNTHROID, LEVOTHROID) 125 MCG tablet Take 125 mcg by mouth daily.      Marland Kitchen LORazepam (ATIVAN) 1 MG tablet Take 1-2 mg by mouth daily as needed for anxiety or sleep. For anxiety      . metaxalone (SKELAXIN) 800 MG tablet Take by mouth 3 (three) times daily.      . metFORMIN (  GLUCOPHAGE) 500 MG tablet Take 1,000 mg by mouth 2 (two) times daily with a meal.       . metoCLOPramide (REGLAN) 10 MG tablet Take 5 mg by mouth 4 (four) times daily.       . metoprolol (LOPRESSOR) 100 MG tablet Take 1 tablet (100 mg total) by mouth 2 (two) times daily.  60 tablet  3  . nitroGLYCERIN (NITROSTAT) 0.4 MG SL tablet Place 0.4 mg under the tongue every 5 (five) minutes as needed for chest pain. For chest pain       . omeprazole (PRILOSEC) 20 MG capsule Take 20 mg by mouth 2 (two) times daily.        . ondansetron (ZOFRAN-ODT) 8 MG disintegrating tablet Take 8 mg by mouth daily as needed for nausea or vomiting.      . potassium  chloride SA (K-DUR,KLOR-CON) 20 MEQ tablet Take 20 mEq by mouth 3 (three) times daily.        No current facility-administered medications for this visit.    Allergies as of 03/31/2014 - Review Complete 03/31/2014  Allergen Reaction Noted  . Ibuprofen  10/13/2013  . Celexa [citalopram hydrobromide]  06/30/2013  . Codeine Nausea And Vomiting     ROS:  General: No fever. Complains of anorexia and weight loss. Positive for weakness.  ENT: Negative for hoarseness, difficulty swallowing , nasal congestion. CV: Negative for chest pain, angina, palpitations, dyspnea on exertion, peripheral edema.  Respiratory: Negative for dyspnea at rest, dyspnea on exertion, cough, sputum, wheezing.  GI: See history of present illness. GU:  Negative for dysuria, hematuria, urinary incontinence, urinary frequency, nocturnal urination.  Endo: See history of present illness     Physical Examination:   BP 105/68  Pulse 80  Temp(Src) 97.1 F (36.2 C) (Oral)  Ht  (1.651 m)  Wt 169 lb 6.4 oz (76.839 kg)  BMI 28.19 kg/m2  General: Chronically ill-appearing white female in no acute distress. Accompanied by daughter  Eyes: No icterus. Mouth: Oropharyngeal mucosa pink , no lesions erythema or exudate. Dry mucous membranes. Positive tardive dyskinesia  Lungs: Clear to auscultation bilaterally.  Heart: Regular rate and rhythm, no murmurs rubs or gallops.  Abdomen: Bowel sounds are normal, nontender, nondistended, no hepatosplenomegaly or masses, no abdominal bruits or hernia , no rebound or guarding.   Extremities: No lower extremity edema. No clubbing or deformities. Neuro: Alert and oriented x 4   Skin: Warm and dry, no jaundice.   Psych: Alert and cooperative, normal mood and affect.  Labs:  Lab Results  Component Value Date   WBC 7.1 01/07/2014   HGB 12.8 01/07/2014   HCT 40.0 01/07/2014   MCV 86.8 01/07/2014   PLT 249 01/07/2014   Lab Results  Component Value Date   CREATININE 0.87 01/07/2014   BUN  19 01/07/2014   NA 140 01/07/2014   K 5.1 01/07/2014   CL 103 01/07/2014   CO2 23 01/07/2014   Lab Results  Component Value Date   ALT 10 12/21/2013   AST 17 12/21/2013   ALKPHOS 75 12/21/2013   BILITOT 0.7 12/21/2013   Lab Results  Component Value Date   TSH 0.085* 12/19/2013    Imaging Studies: No results found.

## 2014-03-31 NOTE — Patient Instructions (Signed)
1. Take Zofran Three times per day before meals. 2. Take Marinol twice per day before meals. 3. Take Zenpep 2 with meals and 1 with snacks. Samples provided. If they help your diarrhea, call for samples. 4. Stop Reglan due to abnormal tongue, mouth motions. 5. Please try to sip on liquids all day to make your urine light yellow. You need to attempt to eat a couple of Boost or Ensure daily. Eat small snacks every 2 to 3 hours. You will continue to lose weight if you don't try to eat. 6. I will discuss your case with Dr. Darrick Penna and further recommendations to be made.

## 2014-04-01 NOTE — Assessment & Plan Note (Signed)
Next EGD due May 2016.

## 2014-04-01 NOTE — Assessment & Plan Note (Addendum)
Continued weight loss, over 60 pounds at this point. Ongoing anorexia, vomiting, chronic abdominal pain, diarrhea. History of chronic iron deficiency anemia although her hemoglobin looks good at this time. Dietary recall reveals patient is consuming limited fluid and solid food concerning to her weight loss. Extensive workup as outlined above. Not clear if gastroparesis is the primary culprit for her anorexia weight loss, consider mesenteric ischemia as well. Previously advised to try pancreatic enzymes but she really has not pursued this option. Has developed tardive dyskinesia on Reglan. Discussed at length with family/patient and they would like to try coming off the medication.  1. Take Zofran Three times per day before meals. 2. Take Marinol twice per day before meals. 3. Take Zenpep 2 with meals and 1 with snacks. Samples provided. If they help your diarrhea, call for samples. 4. Stop Reglan due to abnormal tongue, mouth motions. 5. Please try to sip on liquids all day to make your urine light yellow. You need to attempt to eat a couple of Boost or Ensure daily. Eat small snacks every 2 to 3 hours. You will continue to lose weight if you don't try to eat.  I will discuss with Dr. Darrick Penna. Further recommendations to follow.

## 2014-04-02 NOTE — Progress Notes (Signed)
cc'ed to pcp °

## 2014-04-04 ENCOUNTER — Emergency Department (HOSPITAL_COMMUNITY)
Admission: EM | Admit: 2014-04-04 | Discharge: 2014-04-05 | Disposition: A | Discharge status: Home / Self Care | Payer: Medicare Other | Attending: Emergency Medicine | Admitting: Emergency Medicine

## 2014-04-04 ENCOUNTER — Encounter (HOSPITAL_COMMUNITY): Payer: Self-pay | Admitting: Emergency Medicine

## 2014-04-04 ENCOUNTER — Telehealth: Payer: Self-pay | Admitting: Gastroenterology

## 2014-04-04 ENCOUNTER — Ambulatory Visit: Payer: Medicare Other | Admitting: Gastroenterology

## 2014-04-04 ENCOUNTER — Emergency Department (HOSPITAL_COMMUNITY): Payer: Medicare Other

## 2014-04-04 DIAGNOSIS — R634 Abnormal weight loss: Secondary | ICD-10-CM | POA: Diagnosis not present

## 2014-04-04 DIAGNOSIS — I951 Orthostatic hypotension: Secondary | ICD-10-CM

## 2014-04-04 DIAGNOSIS — G4733 Obstructive sleep apnea (adult) (pediatric): Secondary | ICD-10-CM | POA: Diagnosis not present

## 2014-04-04 DIAGNOSIS — Z7901 Long term (current) use of anticoagulants: Secondary | ICD-10-CM | POA: Insufficient documentation

## 2014-04-04 DIAGNOSIS — G40909 Epilepsy, unspecified, not intractable, without status epilepticus: Secondary | ICD-10-CM | POA: Insufficient documentation

## 2014-04-04 DIAGNOSIS — R531 Weakness: Secondary | ICD-10-CM | POA: Insufficient documentation

## 2014-04-04 DIAGNOSIS — Z87891 Personal history of nicotine dependence: Secondary | ICD-10-CM | POA: Diagnosis not present

## 2014-04-04 DIAGNOSIS — E785 Hyperlipidemia, unspecified: Secondary | ICD-10-CM | POA: Diagnosis not present

## 2014-04-04 DIAGNOSIS — I482 Chronic atrial fibrillation, unspecified: Secondary | ICD-10-CM

## 2014-04-04 DIAGNOSIS — I480 Paroxysmal atrial fibrillation: Secondary | ICD-10-CM | POA: Diagnosis not present

## 2014-04-04 DIAGNOSIS — E669 Obesity, unspecified: Secondary | ICD-10-CM | POA: Insufficient documentation

## 2014-04-04 DIAGNOSIS — E119 Type 2 diabetes mellitus without complications: Secondary | ICD-10-CM | POA: Insufficient documentation

## 2014-04-04 DIAGNOSIS — Z862 Personal history of diseases of the blood and blood-forming organs and certain disorders involving the immune mechanism: Secondary | ICD-10-CM | POA: Diagnosis not present

## 2014-04-04 DIAGNOSIS — K219 Gastro-esophageal reflux disease without esophagitis: Secondary | ICD-10-CM | POA: Insufficient documentation

## 2014-04-04 DIAGNOSIS — E039 Hypothyroidism, unspecified: Secondary | ICD-10-CM | POA: Insufficient documentation

## 2014-04-04 DIAGNOSIS — R627 Adult failure to thrive: Secondary | ICD-10-CM | POA: Diagnosis not present

## 2014-04-04 DIAGNOSIS — Z794 Long term (current) use of insulin: Secondary | ICD-10-CM | POA: Diagnosis not present

## 2014-04-04 DIAGNOSIS — I10 Essential (primary) hypertension: Secondary | ICD-10-CM | POA: Insufficient documentation

## 2014-04-04 DIAGNOSIS — Z9981 Dependence on supplemental oxygen: Secondary | ICD-10-CM | POA: Insufficient documentation

## 2014-04-04 DIAGNOSIS — G8929 Other chronic pain: Secondary | ICD-10-CM | POA: Diagnosis not present

## 2014-04-04 DIAGNOSIS — Z791 Long term (current) use of non-steroidal anti-inflammatories (NSAID): Secondary | ICD-10-CM | POA: Diagnosis not present

## 2014-04-04 DIAGNOSIS — R802 Orthostatic proteinuria, unspecified: Secondary | ICD-10-CM | POA: Diagnosis not present

## 2014-04-04 DIAGNOSIS — R42 Dizziness and giddiness: Secondary | ICD-10-CM | POA: Diagnosis not present

## 2014-04-04 DIAGNOSIS — R110 Nausea: Secondary | ICD-10-CM | POA: Insufficient documentation

## 2014-04-04 DIAGNOSIS — R0602 Shortness of breath: Secondary | ICD-10-CM | POA: Diagnosis not present

## 2014-04-04 LAB — COMPREHENSIVE METABOLIC PANEL
ALT: 9 U/L (ref 0–35)
AST: 15 U/L (ref 0–37)
Albumin: 3.5 g/dL (ref 3.5–5.2)
Alkaline Phosphatase: 73 U/L (ref 39–117)
Anion gap: 18 — ABNORMAL HIGH (ref 5–15)
BUN: 12 mg/dL (ref 6–23)
CALCIUM: 9.1 mg/dL (ref 8.4–10.5)
CO2: 24 mEq/L (ref 19–32)
Chloride: 94 mEq/L — ABNORMAL LOW (ref 96–112)
Creatinine, Ser: 0.72 mg/dL (ref 0.50–1.10)
GFR calc non Af Amer: 88 mL/min — ABNORMAL LOW (ref >90)
Glucose, Bld: 145 mg/dL — ABNORMAL HIGH (ref 70–99)
Potassium: 3.5 mEq/L — ABNORMAL LOW (ref 3.7–5.3)
SODIUM: 136 meq/L — AB (ref 137–147)
TOTAL PROTEIN: 7.3 g/dL (ref 6.0–8.3)
Total Bilirubin: 0.5 mg/dL (ref 0.3–1.2)

## 2014-04-04 LAB — CBC WITH DIFFERENTIAL/PLATELET
BASOS ABS: 0 10*3/uL (ref 0.0–0.1)
Basophils Relative: 0 % (ref 0–1)
EOS ABS: 0 10*3/uL (ref 0.0–0.7)
EOS PCT: 0 % (ref 0–5)
HEMATOCRIT: 40.5 % (ref 36.0–46.0)
Hemoglobin: 13.4 g/dL (ref 12.0–15.0)
LYMPHS PCT: 30 % (ref 12–46)
Lymphs Abs: 2.3 10*3/uL (ref 0.7–4.0)
MCH: 28 pg (ref 26.0–34.0)
MCHC: 33.1 g/dL (ref 30.0–36.0)
MCV: 84.6 fL (ref 78.0–100.0)
Monocytes Absolute: 0.8 10*3/uL (ref 0.1–1.0)
Monocytes Relative: 10 % (ref 3–12)
Neutro Abs: 4.6 10*3/uL (ref 1.7–7.7)
Neutrophils Relative %: 60 % (ref 43–77)
PLATELETS: 307 10*3/uL (ref 150–400)
RBC: 4.79 MIL/uL (ref 3.87–5.11)
RDW: 15.5 % (ref 11.5–15.5)
WBC: 7.8 10*3/uL (ref 4.0–10.5)

## 2014-04-04 LAB — TROPONIN I: Troponin I: <0.3 ng/mL (ref <0.30)

## 2014-04-04 LAB — PRO B NATRIURETIC PEPTIDE: Pro B Natriuretic peptide (BNP): 1689 pg/mL — ABNORMAL HIGH (ref 0–125)

## 2014-04-04 LAB — LIPASE, BLOOD: LIPASE: 41 U/L (ref 11–59)

## 2014-04-04 MED ORDER — SODIUM CHLORIDE 0.9 % IV SOLN
1000.0000 mL | Freq: Once | INTRAVENOUS | Status: AC
  Administered 2014-04-04: 1000 mL via INTRAVENOUS

## 2014-04-04 MED ORDER — SODIUM CHLORIDE 0.9 % IV SOLN
1000.0000 mL | INTRAVENOUS | Status: DC
  Administered 2014-04-04: 1000 mL via INTRAVENOUS

## 2014-04-04 NOTE — ED Notes (Signed)
Nausea, not eating,  Decreased appetite  , weakness

## 2014-04-04 NOTE — Telephone Encounter (Signed)
PATIENT DAUGHTER CALLED CONCERNED FOR HER MOTHER. SEEMS WORSE THAN ON Monday AND IS NOT EATING HARDLY ANYTHING. PLEASE ADVISE [phone] [facility])

## 2014-04-04 NOTE — ED Provider Notes (Signed)
CSN: [id number]     Arrival date & time 04/04/14  1734 History  This chart was scribed for Tara Lyons, MD by Bronson Curb, ED Scribe. This patient was seen in room APA05/APA05 and the patient's care was started at 11:01 PM.    Chief Complaint  Patient presents with  . Nausea     The history is provided by the patient. No language interpreter was used.    HPI Comments: [name] is a 63 y.o. female who presents to the Emergency Department complaining of recurrent episodes of nausea that has been ongoing for 6 months. There is associated decreased appetite, weight loss, and dizziness (room spinning, and feeling as if she is going to faint). She states she was seen by her PCP for similar symptoms, where all tests resulted normal findings, and was informed that she needed to eat. She reports she was sent here by her PCP to be admitted for malnourishment. Patient has history of DM, CHF, HTN, GERD, IBS, and hypothyroid. Patient resides with her daughter and her husband. She was admitted in June 2015 for CHF where she was given Lasix. Patient also received a heart catheterization in June 2015.  Patient had 2 admissions in June 2015 for CP.   PCP Dr. [name]   Past Medical History  Diagnosis Date  . Chest pain     Negative cardiac catheterization in 2002; negative stress nuclear study in 2008  . Congestive heart failure     Normal LV systolic function  . PAF (paroxysmal atrial fibrillation) 2009  . Chronic anticoagulation   . Diabetes mellitus, type 2     Insulin therapy; exacerbated by prednisone  . Syncope     Admitted 05/2009; magnetic resonance imagin/ MRA - negative; etiology thought to be orthostasis secondary to drugs and dehydration  . GERD (gastroesophageal reflux disease)   . IBS (irritable bowel syndrome)   . Hiatal hernia   . Gastroparesis     99% retention 05/2008 on GES  . Hyperlipidemia   . Hypertension   . Hypothyroid   . Chronic LBP     Surgical  intervention in 1996  . Obesity   . OSA on CPAP   . Anemia     H/H of 10/30 with a normal MCV in 12/09  . Barrett's esophagus     Diagnosed 1995. Last EGD 2008.   . Seizures    Past Surgical History  Procedure Laterality Date  . Cardiovascular stress test  2008    Stress nuclear study  . Back surgery  1996  . Laparoscopic cholecystectomy  1990s  . Total abdominal hysterectomy  1999  . Laminectomy  1995    L4-L5  . Carpal tunnel release  1994  . Esophagogastroduodenoscopy  2008    Barrett's without dysplasia. esphagus dilated. antral erosions, h.pylori serologies negative.  . Colonoscopy  11/2011    Dr. Darrick Penna: Internal hemorrhoids, mild diverticulosis. Random colon biopsies negative.  . Givens capsule study  12/07/2011    Proximal small bowel, rare AVM. Distal small bowel, multiple ulcers noted  . Esophagogastroduodenoscopy  11/2011    Dr. Darrick Penna: Barrett's esophagus, mild gastritis, diverticulum in the second portion of the duodenum repeat EGD 3 years. Small bowel biopsies negative. Gastric biopsy show reactive gastropathy but no H. pylori. Esophageal biopsies consistent with GERD. Next EGD 11/2014  . Givens capsule study N/A 09/27/2013    Distal small bowel ulcers extending to TI.  Marland Kitchen Givens capsule study N/A 10/10/2013    Procedure:  GIVENS CAPSULE STUDY;  Surgeon: West Bali, MD;  Location: [facility];  Service: Endoscopy;  Laterality: N/A;  7:30  . Colonoscopy N/A 11/08/2013    SLF: Normal mucosa in the terminal ileum/The colon IS redundant/  Moderate diverticulosis throughout the entire colon. ileum bx benign. colon bx benign  . Esophageal biopsy N/A 11/08/2013    Procedure: BIOPSY  / Tissue sampling / ulcers present in small intestine;  Surgeon: West Bali, MD;  Location: [facility];  Service: Endoscopy;  Laterality: N/A;  . Cardiac catheterization  2002   Family History  Problem Relation Age of Onset  . Hypertension Mother   . Alzheimer's disease Mother   . Stroke  Mother   . Heart attack Mother   . Heart disease Neg Hx   . Hypertension Other   . Colon cancer Neg Hx    History  Substance Use Topics  . Smoking status: Former Smoker -- 15 years    Quit date: 07/01/1983  . Smokeless tobacco: Never Used     Comment: Remote  . Alcohol Use: No     Comment: last etoh one year ago, never frequent   OB History   Grav Para Term Preterm Abortions TAB SAB Ect Mult Living   2 2 2             Review of Systems   A complete 10 system review of systems was obtained and all systems are negative except as noted in the HPI and PMH.     Allergies  Ibuprofen; Celexa; and Codeine  Home Medications   Prior to Admission medications   Medication Sig Start Date End Date Taking? Authorizing Provider  apixaban (ELIQUIS) 5 MG TABS tablet Take 1 tablet (5 mg total) by mouth 2 (two) times daily. 12/16/13  Yes Antoine Poche, MD  diltiazem (CARDIZEM CD) 240 MG 24 hr capsule Take 240 mg by mouth every evening. 07/04/13  Yes [name], MD  dronabinol (MARINOL) 2.5 MG capsule Take 1 capsule (2.5 mg total) by mouth 2 (two) times daily before a meal. 03/31/14  Yes Tiffany Kocher, PA-C  DULoxetine (CYMBALTA) 60 MG capsule Take 60 mg by mouth daily.   Yes Historical Provider, MD  glipiZIDE (GLUCOTROL) 10 MG tablet Take 10 mg by mouth daily before breakfast.    Yes Historical Provider, MD  HYDROcodone-acetaminophen (NORCO/VICODIN) 5-325 MG per tablet Take 2 tablets by mouth every 6 (six) hours as needed for moderate pain.   Yes Historical Provider, MD  Insulin Glargine (LANTUS SOLOSTAR) 100 UNIT/ML Solostar Pen Inject 22 Units into the skin at bedtime.    Yes Historical Provider, MD  levETIRAcetam (KEPPRA) 250 MG tablet Take 1 tablet (250 mg total) by mouth 2 (two) times daily. 11/10/12  Yes [name], MD  levothyroxine (SYNTHROID, LEVOTHROID) 125 MCG tablet Take 125 mcg by mouth daily.   Yes Historical Provider, MD  LORazepam (ATIVAN) 1 MG tablet Take 1-2 mg  by mouth daily as needed for anxiety or sleep. For anxiety   Yes Historical Provider, MD  metaxalone (SKELAXIN) 800 MG tablet Take 800 mg by mouth 3 (three) times daily.  03/07/14  Yes Historical Provider, MD  metFORMIN (GLUCOPHAGE) 500 MG tablet Take 1,000 mg by mouth 2 (two) times daily with a meal.    Yes Historical Provider, MD  metoprolol (LOPRESSOR) 100 MG tablet Take 1 tablet (100 mg total) by mouth 2 (two) times daily. 01/20/14  Yes Jodelle Gross, NP  omeprazole (PRILOSEC) 20 MG capsule Take 20 mg by mouth 2 (two) times daily.     Yes Historical Provider, MD  ondansetron (ZOFRAN) 4 MG tablet Take 4 mg by mouth every 6 (six) hours as needed. For nausea 03/15/14  Yes Historical Provider, MD  potassium chloride SA (K-DUR,KLOR-CON) 20 MEQ tablet Take 20 mEq by mouth 3 (three) times daily.    Yes Historical Provider, MD  furosemide (LASIX) 40 MG tablet Take 20 mg by mouth daily. Three times a week 12/03/13   Historical Provider, MD  nitroGLYCERIN (NITROSTAT) 0.4 MG SL tablet Place 0.4 mg under the tongue every 5 (five) minutes as needed for chest pain. For chest pain     Historical Provider, MD   Triage Vitals: BP 119/77  Pulse 79  Temp(Src) 97.7 F (36.5 C) (Axillary)  Resp 22  Ht 5\' 5"  (1.651 m)  Wt 161 lb 1 oz (73.057 kg)  BMI 26.80 kg/m2  SpO2 100%  Physical Exam  Nursing note and vitals reviewed. Constitutional: She is oriented to person, place, and time. She appears well-developed and well-nourished. No distress.  HENT:  Head: Normocephalic and atraumatic.  Mouth/Throat: Oropharynx is clear and moist.  Eyes: Conjunctivae and EOM are normal.  Neck: Normal range of motion. Neck supple. No tracheal deviation present.  Cardiovascular: Normal rate, regular rhythm and normal heart sounds.   No murmur heard. Pulmonary/Chest: Effort normal and breath sounds normal. No respiratory distress.  Abdominal: Soft. Bowel sounds are normal. She exhibits no distension. There is no tenderness.   Musculoskeletal: Normal range of motion. She exhibits no edema.  Neurological: She is alert and oriented to person, place, and time.  Skin: Skin is warm and dry.  Psychiatric: She has a normal mood and affect. Her behavior is normal.    ED Course  Procedures (including critical care time)  DIAGNOSTIC STUDIES: Oxygen Saturation is 100% on room air, normal by my interpretation.    COORDINATION OF CARE: At 2306 Discussed treatment plan with patient which includes IV fluids and labs. Patient agrees.   Labs Review Labs Reviewed  COMPREHENSIVE METABOLIC PANEL - Abnormal; Notable for the following:    Sodium 136 (*)    Potassium 3.5 (*)    Chloride 94 (*)    Glucose, Bld 145 (*)    GFR calc non Af Amer 88 (*)    Anion gap 18 (*)    All other components within normal limits  CBC WITH DIFFERENTIAL  URINALYSIS, ROUTINE W REFLEX MICROSCOPIC    Imaging Review No results found.   EKG Interpretation   Date/Time:  Friday April 04 2014 23:27:23 EDT Ventricular Rate:  82 PR Interval:    QRS Duration: 111 QT Interval:  437 QTC Calculation: 510 R Axis:   -43 Text Interpretation:  Atrial fibrillation Left axis deviation Probable  lateral infarct, age indeterminate Prolonged QT interval Baseline wander  in lead(s) V1 No significant change since 01/07/2014 Confirmed by DELOS  MD,  Louna Rothgeb ([id number]) on 04/04/2014 11:40:44 PM      MDM   Final diagnoses:  None    Patient is a 63 year old female brought by her daughter for evaluation of weakness, mild shortness of breath, and decreased by mouth intake. This is apparently been worsening over the past 6 months and the daughter is concerned because she seems weaker now than she has been.  The daughter spoke with Dr. Michelle NasutiKnowlton's office who advised her to come to the ER to be evaluated. Workup here reveals unremarkable blood  counts and essentially unremarkable metabolic profile. Her albumin is in the normal range and there are no electrolyte  disturbances that would suggest significant dehydration. The only significant abnormality is in her vital signs as she was noted to be somewhat orthostatic with standing. Her blood pressure dropped however she was asymptomatic with this.  For this reason, consultation with the hospitalist service for possible admission was obtained. Patient was evaluated and discussion between Dr. Konrad Dolores resulted in the daughter preferring discharge to admission. He has made recommendations to prescribe Megace as an appetite stimulant which was done. They are to followup with Dr. [name] next week and return to the ER if they experience any new and concerning issues.  I personally performed the services described in this documentation, which was scribed in my presence. The recorded information has been reviewed and is accurate.      Tara Lyons, MD 04/05/14 [phone]

## 2014-04-04 NOTE — Telephone Encounter (Signed)
I called [name]. She said she is very worried about her mom. She stays nauseated and no appetite.  She is aware of the instructions [name] gave her at office visit on 03/31/2014. She said she had some dizzy spells last night and almost fell. I told her if she is having dizzy spells and not eating or drinking she should go to the ED.  She said that is what she thought she should do.

## 2014-04-04 NOTE — ED Notes (Signed)
Family states pt has had nausea for the past 6 months. Pt not eating now. Pt states she has had some abdominal pain but denies any at current.

## 2014-04-05 DIAGNOSIS — I482 Chronic atrial fibrillation: Secondary | ICD-10-CM

## 2014-04-05 DIAGNOSIS — E785 Hyperlipidemia, unspecified: Secondary | ICD-10-CM

## 2014-04-05 DIAGNOSIS — R531 Weakness: Secondary | ICD-10-CM | POA: Diagnosis not present

## 2014-04-05 DIAGNOSIS — I951 Orthostatic hypotension: Secondary | ICD-10-CM

## 2014-04-05 DIAGNOSIS — R627 Adult failure to thrive: Secondary | ICD-10-CM | POA: Diagnosis not present

## 2014-04-05 DIAGNOSIS — R110 Nausea: Secondary | ICD-10-CM | POA: Diagnosis not present

## 2014-04-05 DIAGNOSIS — R634 Abnormal weight loss: Secondary | ICD-10-CM | POA: Diagnosis not present

## 2014-04-05 LAB — URINALYSIS, ROUTINE W REFLEX MICROSCOPIC
GLUCOSE, UA: NEGATIVE mg/dL
Hgb urine dipstick: NEGATIVE
Ketones, ur: 15 mg/dL — AB
LEUKOCYTES UA: NEGATIVE
Nitrite: NEGATIVE
PROTEIN: 30 mg/dL — AB
SPECIFIC GRAVITY, URINE: 1.01 (ref 1.005–1.030)
UROBILINOGEN UA: 0.2 mg/dL (ref 0.0–1.0)
pH: 6 (ref 5.0–8.0)

## 2014-04-05 LAB — URINE MICROSCOPIC-ADD ON

## 2014-04-05 MED ORDER — MEGESTROL ACETATE 625 MG/5ML PO SUSP: 625.0000 mg | Freq: Every day | ORAL | Status: DC

## 2014-04-05 NOTE — Consult Note (Addendum)
[facility] History and Physical  [name] JXB:[id number] DOB: [birth year] DOA: 04/04/2014  Referring physician: Dr. Judd Lien PCP: [name], MD  Specialists: none  Chief Complaint: failure to thrive  Assessment/Plan Active Problems: Failure to thrive orthostasis CHF  Pt and daughter seen in ED. Pt stays with daughter. Desiring to go home at time of exam. Pt was asymptomatic during orthostatic vital signs. Feels significantly better after IVF. No signs of CHF exacerbation today despite elevated pro BNP. Discussed options w/ family and will give Megace Rx at time of DC from ED. Close f/u w/ PCP this week. Family to decrease metoprolol dose by half. Monitor closely for CHF exacerbation. Again, pt and daughter adament about DC. Discussed case w/ Dr. Judd Lien and pt will be discharged w/ Rx for Megace.    HPI: [name] is a 63 y.o. female came to [facility] ed 04/05/2014 with  Gradual wt loss and loss of apetite over several months. Seen in PCP office and told to come to ED today for nutrition.   Review of Systems: Per HPI w/ all other systems negative.   Past Medical History  Diagnosis Date  . Chest pain     Negative cardiac catheterization in 2002; negative stress nuclear study in 2008  . Congestive heart failure     Normal LV systolic function  . PAF (paroxysmal atrial fibrillation) 2009  . Chronic anticoagulation   . Diabetes mellitus, type 2     Insulin therapy; exacerbated by prednisone  . Syncope     Admitted 05/2009; magnetic resonance imagin/ MRA - negative; etiology thought to be orthostasis secondary to drugs and dehydration  . GERD (gastroesophageal reflux disease)   . IBS (irritable bowel syndrome)   . Hiatal hernia   . Gastroparesis     99% retention 05/2008 on GES  . Hyperlipidemia   . Hypertension   . Hypothyroid   . Chronic LBP     Surgical intervention in 1996  . Obesity   . OSA on CPAP   . Anemia     H/H of 10/30 with a normal MCV in 12/09  .  Barrett's esophagus     Diagnosed 1995. Last EGD 2008.   . Seizures    Past Surgical History  Procedure Laterality Date  . Cardiovascular stress test  2008    Stress nuclear study  . Back surgery  1996  . Laparoscopic cholecystectomy  1990s  . Total abdominal hysterectomy  1999  . Laminectomy  1995    L4-L5  . Carpal tunnel release  1994  . Esophagogastroduodenoscopy  2008    Barrett's without dysplasia. esphagus dilated. antral erosions, h.pylori serologies negative.  . Colonoscopy  11/2011    Dr. Darrick Penna: Internal hemorrhoids, mild diverticulosis. Random colon biopsies negative.  . Givens capsule study  12/07/2011    Proximal small bowel, rare AVM. Distal small bowel, multiple ulcers noted  . Esophagogastroduodenoscopy  11/2011    Dr. Darrick Penna: Barrett's esophagus, mild gastritis, diverticulum in the second portion of the duodenum repeat EGD 3 years. Small bowel biopsies negative. Gastric biopsy show reactive gastropathy but no H. pylori. Esophageal biopsies consistent with GERD. Next EGD 11/2014  . Givens capsule study N/A 09/27/2013    Distal small bowel ulcers extending to TI.  Marland Kitchen Givens capsule study N/A 10/10/2013    Procedure: GIVENS CAPSULE STUDY;  Surgeon: West Bali, MD;  Location: [facility];  Service: Endoscopy;  Laterality: N/A;  7:30  . Colonoscopy N/A 11/08/2013  SLF: Normal mucosa in the terminal ileum/The colon IS redundant/  Moderate diverticulosis throughout the entire colon. ileum bx benign. colon bx benign  . Esophageal biopsy N/A 11/08/2013    Procedure: BIOPSY  / Tissue sampling / ulcers present in small intestine;  Surgeon: West Bali, MD;  Location: [facility];  Service: Endoscopy;  Laterality: N/A;  . Cardiac catheterization  2002   Social History:  History   Social History Narrative   Sedentary   4 children, "blended family"    Allergies  Allergen Reactions  . Ibuprofen     Upsets her stomach and causes abdominal pain  . Celexa [Citalopram  Hydrobromide]   . Codeine Nausea And Vomiting    HALLUCINATIONS    Family History  Problem Relation Age of Onset  . Hypertension Mother   . Alzheimer's disease Mother   . Stroke Mother   . Heart attack Mother   . Heart disease Neg Hx   . Hypertension Other   . Colon cancer Neg Hx      Prior to Admission medications   Medication Sig Start Date End Date Taking? Authorizing Provider  apixaban (ELIQUIS) 5 MG TABS tablet Take 1 tablet (5 mg total) by mouth 2 (two) times daily. 12/16/13  Yes Antoine Poche, MD  diltiazem (CARDIZEM CD) 240 MG 24 hr capsule Take 240 mg by mouth every evening. 07/04/13  Yes [name], MD  dronabinol (MARINOL) 2.5 MG capsule Take 1 capsule (2.5 mg total) by mouth 2 (two) times daily before a meal. 03/31/14  Yes Tiffany Kocher, PA-C  DULoxetine (CYMBALTA) 60 MG capsule Take 60 mg by mouth daily.   Yes Historical Provider, MD  glipiZIDE (GLUCOTROL) 10 MG tablet Take 10 mg by mouth daily before breakfast.    Yes Historical Provider, MD  HYDROcodone-acetaminophen (NORCO/VICODIN) 5-325 MG per tablet Take 2 tablets by mouth every 6 (six) hours as needed for moderate pain.   Yes Historical Provider, MD  Insulin Glargine (LANTUS SOLOSTAR) 100 UNIT/ML Solostar Pen Inject 22 Units into the skin at bedtime.    Yes Historical Provider, MD  levETIRAcetam (KEPPRA) 250 MG tablet Take 1 tablet (250 mg total) by mouth 2 (two) times daily. 11/10/12  Yes [name], MD  levothyroxine (SYNTHROID, LEVOTHROID) 125 MCG tablet Take 125 mcg by mouth daily.   Yes Historical Provider, MD  LORazepam (ATIVAN) 1 MG tablet Take 1-2 mg by mouth daily as needed for anxiety or sleep. For anxiety   Yes Historical Provider, MD  metaxalone (SKELAXIN) 800 MG tablet Take 800 mg by mouth 3 (three) times daily.  03/07/14  Yes Historical Provider, MD  metFORMIN (GLUCOPHAGE) 500 MG tablet Take 1,000 mg by mouth 2 (two) times daily with a meal.    Yes Historical Provider, MD  metoprolol  (LOPRESSOR) 100 MG tablet Take 1 tablet (100 mg total) by mouth 2 (two) times daily. 01/20/14  Yes Jodelle Gross, NP  omeprazole (PRILOSEC) 20 MG capsule Take 20 mg by mouth 2 (two) times daily.     Yes Historical Provider, MD  ondansetron (ZOFRAN) 4 MG tablet Take 4 mg by mouth every 6 (six) hours as needed. For nausea 03/15/14  Yes Historical Provider, MD  potassium chloride SA (K-DUR,KLOR-CON) 20 MEQ tablet Take 20 mEq by mouth 3 (three) times daily.    Yes Historical Provider, MD  furosemide (LASIX) 40 MG tablet Take 20 mg by mouth daily. Three times a week 12/03/13   Historical Provider, MD  nitroGLYCERIN (NITROSTAT) 0.4 MG SL tablet Place 0.4 mg under the tongue every 5 (five) minutes as needed for chest pain. For chest pain     Historical Provider, MD   Physical Exam: Filed Vitals:   04/04/14 1739 04/04/14 1933  BP: 119/77   Pulse: 86 79  Temp: 97.7 F (36.5 C)   TempSrc: Axillary   Resp: 24 22  Height: 5\' 5"  (1.651 m)   Weight: 73.057 kg (161 lb 1 oz)   SpO2: 100% 100%     General:   NAD  Eyes: EOMI   ENT: mm,  Neck: FROM  Cardiovascular: RRR,  Respiratory: Nml WOB.   Abdomen:  nonttp  Skin: warm, well perfused, intact  Musculoskeletal: Trace LE edema, moves all extremities spontaneously   Psychiatric: nml affect  Neurologic: CN2-12 Grossly intact, cerebellar fxn nml, oral dyskinesia    Labs on Admission:  Basic Metabolic Panel:  Recent Labs Lab 04/04/14 1819  NA 136*  K 3.5*  CL 94*  CO2 24  GLUCOSE 145*  BUN 12  CREATININE 0.72  CALCIUM 9.1   Liver Function Tests:  Recent Labs Lab 04/04/14 1819  AST 15  ALT 9  ALKPHOS 73  BILITOT 0.5  PROT 7.3  ALBUMIN 3.5    Recent Labs Lab 04/04/14 2327  LIPASE 41   No results found for this basename: AMMONIA,  in the last 168 hours CBC:  Recent Labs Lab 04/04/14 1819  WBC 7.8  NEUTROABS 4.6  HGB 13.4  HCT 40.5  MCV 84.6  PLT 307   Cardiac Enzymes:  Recent Labs Lab  04/04/14 2327  TROPONINI <0.30    BNP (last 3 results)  Recent Labs  01/03/14 2253 01/07/14 2029 04/04/14 2327  PROBNP 1015.0* 1495.0* 1689.0*   CBG: No results found for this basename: GLUCAP,  in the last 168 hours  Radiological Exams on Admission: Dg Chest 2 View  04/05/2014   CLINICAL DATA:  Patient complains of nausea, weakness, shortness of breath. History of atrial fibrillation and congestive heart failure.  EXAM: CHEST  2 VIEW  COMPARISON:  Chest radiograph 01/07/2014  FINDINGS: Stable cardiomegaly. Elevation of the left hemidiaphragm. Minimal heterogeneous opacities left lung base. No large consolidative pulmonary opacity. No pleural effusion or pneumothorax. Lateral view is limited due to multiple overlying leads and soft tissue.  IMPRESSION: Cardiomegaly. Elevation left hemidiaphragm. Heterogeneous left lung base opacities likely represent combination of scarring and or atelectasis.   Electronically Signed   By: Annia Beltrew  Davis M.D.   On: 04/05/2014 00:36       Time spent: >7255min in direct pt care and coordination   MERRELL, DAVID J, MD [facility] [email or web address] Password [email or web address] 04/05/2014, 1:56 AM

## 2014-04-05 NOTE — ED Notes (Signed)
While completing orthostatic vital signs, upon standing blood pressure was 75/43. Pt reported no light-headedness or dizziness. I sat pt back down and waited 2 minutes. Pt stood again and rechecked BP standing to get 58/39, again pt was not symptomatic and was talking with family. RN notified.

## 2014-04-05 NOTE — Discharge Instructions (Signed)
Megace as prescribed.  Followup next week with your primary Dr., and return to the ER if you develop any significantly worsening or new symptoms.   Nausea and Vomiting Nausea is a sick feeling that often comes before throwing up (vomiting). Vomiting is a reflex where stomach contents come out of your mouth. Vomiting can cause severe loss of body fluids (dehydration). Children and elderly adults can become dehydrated quickly, especially if they also have diarrhea. Nausea and vomiting are symptoms of a condition or disease. It is important to find the cause of your symptoms. CAUSES   Direct irritation of the stomach lining. This irritation can result from increased acid production (gastroesophageal reflux disease), infection, food poisoning, taking certain medicines (such as nonsteroidal anti-inflammatory drugs), alcohol use, or tobacco use.  Signals from the brain.These signals could be caused by a headache, heat exposure, an inner ear disturbance, increased pressure in the brain from injury, infection, a tumor, or a concussion, pain, emotional stimulus, or metabolic problems.  An obstruction in the gastrointestinal tract (bowel obstruction).  Illnesses such as diabetes, hepatitis, gallbladder problems, appendicitis, kidney problems, cancer, sepsis, atypical symptoms of a heart attack, or eating disorders.  Medical treatments such as chemotherapy and radiation.  Receiving medicine that makes you sleep (general anesthetic) during surgery. DIAGNOSIS Your caregiver may ask for tests to be done if the problems do not improve after a few days. Tests may also be done if symptoms are severe or if the reason for the nausea and vomiting is not clear. Tests may include:  Urine tests.  Blood tests.  Stool tests.  Cultures (to look for evidence of infection).  X-rays or other imaging studies. Test results can help your caregiver make decisions about treatment or the need for additional  tests. TREATMENT You need to stay well hydrated. Drink frequently but in small amounts.You may wish to drink water, sports drinks, clear broth, or eat frozen ice pops or gelatin dessert to help stay hydrated.When you eat, eating slowly may help prevent nausea.There are also some antinausea medicines that may help prevent nausea. HOME CARE INSTRUCTIONS   Take all medicine as directed by your caregiver.  If you do not have an appetite, do not force yourself to eat. However, you must continue to drink fluids.  If you have an appetite, eat a normal diet unless your caregiver tells you differently.  Eat a variety of complex carbohydrates (rice, wheat, potatoes, bread), lean meats, yogurt, fruits, and vegetables.  Avoid high-fat foods because they are more difficult to digest.  Drink enough water and fluids to keep your urine clear or pale yellow.  If you are dehydrated, ask your caregiver for specific rehydration instructions. Signs of dehydration may include:  Severe thirst.  Dry lips and mouth.  Dizziness.  Dark urine.  Decreasing urine frequency and amount.  Confusion.  Rapid breathing or pulse. SEEK IMMEDIATE MEDICAL CARE IF:   You have blood or brown flecks (like coffee grounds) in your vomit.  You have black or bloody stools.  You have a severe headache or stiff neck.  You are confused.  You have severe abdominal pain.  You have chest pain or trouble breathing.  You do not urinate at least once every 8 hours.  You develop cold or clammy skin.  You continue to vomit for longer than 24 to 48 hours.  You have a fever. MAKE SURE YOU:   Understand these instructions.  Will watch your condition.  Will get help right away if you  are not doing well or get worse. Document Released: 06/20/2005 Document Revised: 09/12/2011 Document Reviewed: 11/17/2010 [facility] Patient Information 2015 [place], [place]. This information is not intended to replace advice given to  you by your health care provider. Make sure you discuss any questions you have with your health care provider.

## 2014-04-05 NOTE — ED Notes (Signed)
Pt given crackers, peanut butter & a drink at this time.

## 2014-04-05 NOTE — ED Notes (Signed)
Pt ambulated to restroom & returned to room w/ no complications. 

## 2014-04-10 ENCOUNTER — Encounter (HOSPITAL_COMMUNITY): Payer: Self-pay | Admitting: Emergency Medicine

## 2014-04-10 ENCOUNTER — Observation Stay (HOSPITAL_COMMUNITY)
Admission: EM | Admit: 2014-04-10 | Discharge: 2014-04-12 | Disposition: A | Discharge status: Home / Self Care | Payer: Medicare Other | Attending: Internal Medicine | Admitting: Internal Medicine

## 2014-04-10 DIAGNOSIS — E039 Hypothyroidism, unspecified: Secondary | ICD-10-CM | POA: Diagnosis not present

## 2014-04-10 DIAGNOSIS — I4891 Unspecified atrial fibrillation: Secondary | ICD-10-CM

## 2014-04-10 DIAGNOSIS — Z79899 Other long term (current) drug therapy: Secondary | ICD-10-CM | POA: Diagnosis not present

## 2014-04-10 DIAGNOSIS — E119 Type 2 diabetes mellitus without complications: Secondary | ICD-10-CM | POA: Diagnosis not present

## 2014-04-10 DIAGNOSIS — E669 Obesity, unspecified: Secondary | ICD-10-CM

## 2014-04-10 DIAGNOSIS — K3184 Gastroparesis: Secondary | ICD-10-CM | POA: Diagnosis not present

## 2014-04-10 DIAGNOSIS — R5383 Other fatigue: Secondary | ICD-10-CM | POA: Insufficient documentation

## 2014-04-10 DIAGNOSIS — K219 Gastro-esophageal reflux disease without esophagitis: Secondary | ICD-10-CM | POA: Insufficient documentation

## 2014-04-10 DIAGNOSIS — I480 Paroxysmal atrial fibrillation: Secondary | ICD-10-CM | POA: Insufficient documentation

## 2014-04-10 DIAGNOSIS — D649 Anemia, unspecified: Secondary | ICD-10-CM | POA: Diagnosis not present

## 2014-04-10 DIAGNOSIS — R634 Abnormal weight loss: Secondary | ICD-10-CM

## 2014-04-10 DIAGNOSIS — R0600 Dyspnea, unspecified: Secondary | ICD-10-CM

## 2014-04-10 DIAGNOSIS — R197 Diarrhea, unspecified: Secondary | ICD-10-CM | POA: Diagnosis not present

## 2014-04-10 DIAGNOSIS — I509 Heart failure, unspecified: Secondary | ICD-10-CM | POA: Diagnosis not present

## 2014-04-10 DIAGNOSIS — I10 Essential (primary) hypertension: Secondary | ICD-10-CM

## 2014-04-10 DIAGNOSIS — Z23 Encounter for immunization: Secondary | ICD-10-CM | POA: Insufficient documentation

## 2014-04-10 DIAGNOSIS — R251 Tremor, unspecified: Secondary | ICD-10-CM

## 2014-04-10 DIAGNOSIS — Z7901 Long term (current) use of anticoagulants: Secondary | ICD-10-CM

## 2014-04-10 DIAGNOSIS — E872 Acidosis, unspecified: Secondary | ICD-10-CM

## 2014-04-10 DIAGNOSIS — E785 Hyperlipidemia, unspecified: Secondary | ICD-10-CM

## 2014-04-10 DIAGNOSIS — G4733 Obstructive sleep apnea (adult) (pediatric): Secondary | ICD-10-CM | POA: Insufficient documentation

## 2014-04-10 DIAGNOSIS — I482 Chronic atrial fibrillation, unspecified: Secondary | ICD-10-CM

## 2014-04-10 DIAGNOSIS — K589 Irritable bowel syndrome without diarrhea: Secondary | ICD-10-CM

## 2014-04-10 DIAGNOSIS — R079 Chest pain, unspecified: Secondary | ICD-10-CM

## 2014-04-10 DIAGNOSIS — I951 Orthostatic hypotension: Secondary | ICD-10-CM

## 2014-04-10 DIAGNOSIS — D509 Iron deficiency anemia, unspecified: Secondary | ICD-10-CM

## 2014-04-10 DIAGNOSIS — Z7902 Long term (current) use of antithrombotics/antiplatelets: Secondary | ICD-10-CM | POA: Diagnosis not present

## 2014-04-10 DIAGNOSIS — F5000 Anorexia nervosa, unspecified: Secondary | ICD-10-CM

## 2014-04-10 DIAGNOSIS — R531 Weakness: Secondary | ICD-10-CM | POA: Diagnosis present

## 2014-04-10 DIAGNOSIS — G40909 Epilepsy, unspecified, not intractable, without status epilepticus: Secondary | ICD-10-CM | POA: Diagnosis not present

## 2014-04-10 DIAGNOSIS — E118 Type 2 diabetes mellitus with unspecified complications: Secondary | ICD-10-CM

## 2014-04-10 DIAGNOSIS — Z794 Long term (current) use of insulin: Secondary | ICD-10-CM | POA: Diagnosis not present

## 2014-04-10 DIAGNOSIS — E43 Unspecified severe protein-calorie malnutrition: Secondary | ICD-10-CM

## 2014-04-10 DIAGNOSIS — K529 Noninfective gastroenteritis and colitis, unspecified: Secondary | ICD-10-CM

## 2014-04-10 DIAGNOSIS — R1030 Lower abdominal pain, unspecified: Secondary | ICD-10-CM

## 2014-04-10 DIAGNOSIS — K2270 Barrett's esophagus without dysplasia: Secondary | ICD-10-CM

## 2014-04-10 DIAGNOSIS — G8929 Other chronic pain: Secondary | ICD-10-CM | POA: Insufficient documentation

## 2014-04-10 DIAGNOSIS — Z5181 Encounter for therapeutic drug level monitoring: Secondary | ICD-10-CM

## 2014-04-10 DIAGNOSIS — I959 Hypotension, unspecified: Secondary | ICD-10-CM

## 2014-04-10 DIAGNOSIS — R262 Difficulty in walking, not elsewhere classified: Secondary | ICD-10-CM | POA: Diagnosis not present

## 2014-04-10 DIAGNOSIS — R569 Unspecified convulsions: Secondary | ICD-10-CM | POA: Diagnosis present

## 2014-04-10 DIAGNOSIS — I5032 Chronic diastolic (congestive) heart failure: Secondary | ICD-10-CM

## 2014-04-10 DIAGNOSIS — E1122 Type 2 diabetes mellitus with diabetic chronic kidney disease: Secondary | ICD-10-CM

## 2014-04-10 DIAGNOSIS — E038 Other specified hypothyroidism: Secondary | ICD-10-CM

## 2014-04-10 DIAGNOSIS — R072 Precordial pain: Secondary | ICD-10-CM

## 2014-04-10 DIAGNOSIS — F0390 Unspecified dementia without behavioral disturbance: Secondary | ICD-10-CM

## 2014-04-10 DIAGNOSIS — R55 Syncope and collapse: Principal | ICD-10-CM

## 2014-04-10 DIAGNOSIS — K449 Diaphragmatic hernia without obstruction or gangrene: Secondary | ICD-10-CM | POA: Diagnosis not present

## 2014-04-10 DIAGNOSIS — R9431 Abnormal electrocardiogram [ECG] [EKG]: Secondary | ICD-10-CM

## 2014-04-10 LAB — CBC WITH DIFFERENTIAL/PLATELET
BASOS PCT: 0 % (ref 0–1)
Basophils Absolute: 0 10*3/uL (ref 0.0–0.1)
Eosinophils Absolute: 0.1 10*3/uL (ref 0.0–0.7)
Eosinophils Relative: 2 % (ref 0–5)
HEMATOCRIT: 37 % (ref 36.0–46.0)
HEMOGLOBIN: 12 g/dL (ref 12.0–15.0)
Lymphocytes Relative: 28 % (ref 12–46)
Lymphs Abs: 2.5 10*3/uL (ref 0.7–4.0)
MCH: 28.4 pg (ref 26.0–34.0)
MCHC: 32.4 g/dL (ref 30.0–36.0)
MCV: 87.7 fL (ref 78.0–100.0)
MONO ABS: 0.7 10*3/uL (ref 0.1–1.0)
MONOS PCT: 8 % (ref 3–12)
NEUTROS ABS: 5.6 10*3/uL (ref 1.7–7.7)
Neutrophils Relative %: 62 % (ref 43–77)
Platelets: 216 10*3/uL (ref 150–400)
RBC: 4.22 MIL/uL (ref 3.87–5.11)
RDW: 16.2 % — ABNORMAL HIGH (ref 11.5–15.5)
WBC: 8.9 10*3/uL (ref 4.0–10.5)

## 2014-04-10 NOTE — ED Notes (Addendum)
Per EMS - pt c/o weakness for the past 2 months. Pt was cooking dinner and began to feel weak, daughter came in room and reported low bp from wrist bp cuff. Pt VSS and CBG WNL. Pt orthostatic BP dropped when rising. Pt EKG reads AFib (pt hx of afib). Given fluid.

## 2014-04-11 ENCOUNTER — Observation Stay (HOSPITAL_COMMUNITY): Payer: Medicare Other

## 2014-04-11 ENCOUNTER — Encounter (HOSPITAL_COMMUNITY): Payer: Self-pay | Admitting: Internal Medicine

## 2014-04-11 DIAGNOSIS — I482 Chronic atrial fibrillation: Secondary | ICD-10-CM

## 2014-04-11 DIAGNOSIS — I951 Orthostatic hypotension: Secondary | ICD-10-CM | POA: Diagnosis not present

## 2014-04-11 DIAGNOSIS — F0390 Unspecified dementia without behavioral disturbance: Secondary | ICD-10-CM | POA: Diagnosis present

## 2014-04-11 DIAGNOSIS — R9431 Abnormal electrocardiogram [ECG] [EKG]: Secondary | ICD-10-CM

## 2014-04-11 DIAGNOSIS — I4891 Unspecified atrial fibrillation: Secondary | ICD-10-CM

## 2014-04-11 DIAGNOSIS — K529 Noninfective gastroenteritis and colitis, unspecified: Secondary | ICD-10-CM

## 2014-04-11 DIAGNOSIS — Z7901 Long term (current) use of anticoagulants: Secondary | ICD-10-CM

## 2014-04-11 DIAGNOSIS — R569 Unspecified convulsions: Secondary | ICD-10-CM | POA: Diagnosis present

## 2014-04-11 DIAGNOSIS — I5032 Chronic diastolic (congestive) heart failure: Secondary | ICD-10-CM

## 2014-04-11 DIAGNOSIS — E43 Unspecified severe protein-calorie malnutrition: Secondary | ICD-10-CM | POA: Insufficient documentation

## 2014-04-11 DIAGNOSIS — R634 Abnormal weight loss: Secondary | ICD-10-CM | POA: Diagnosis not present

## 2014-04-11 DIAGNOSIS — J9811 Atelectasis: Secondary | ICD-10-CM | POA: Diagnosis not present

## 2014-04-11 DIAGNOSIS — R55 Syncope and collapse: Secondary | ICD-10-CM | POA: Diagnosis not present

## 2014-04-11 DIAGNOSIS — R42 Dizziness and giddiness: Secondary | ICD-10-CM | POA: Diagnosis not present

## 2014-04-11 DIAGNOSIS — E118 Type 2 diabetes mellitus with unspecified complications: Secondary | ICD-10-CM

## 2014-04-11 DIAGNOSIS — I959 Hypotension, unspecified: Secondary | ICD-10-CM

## 2014-04-11 DIAGNOSIS — K3184 Gastroparesis: Secondary | ICD-10-CM

## 2014-04-11 DIAGNOSIS — E872 Acidosis: Secondary | ICD-10-CM | POA: Diagnosis not present

## 2014-04-11 DIAGNOSIS — I10 Essential (primary) hypertension: Secondary | ICD-10-CM

## 2014-04-11 DIAGNOSIS — E119 Type 2 diabetes mellitus without complications: Secondary | ICD-10-CM

## 2014-04-11 LAB — CBC
HEMATOCRIT: 35.6 % — AB (ref 36.0–46.0)
Hemoglobin: 11.3 g/dL — ABNORMAL LOW (ref 12.0–15.0)
MCH: 27.7 pg (ref 26.0–34.0)
MCHC: 31.7 g/dL (ref 30.0–36.0)
MCV: 87.3 fL (ref 78.0–100.0)
Platelets: 220 10*3/uL (ref 150–400)
RBC: 4.08 MIL/uL (ref 3.87–5.11)
RDW: 16.1 % — ABNORMAL HIGH (ref 11.5–15.5)
WBC: 8.7 10*3/uL (ref 4.0–10.5)

## 2014-04-11 LAB — COMPREHENSIVE METABOLIC PANEL
ALBUMIN: 3 g/dL — AB (ref 3.5–5.2)
ALK PHOS: 49 U/L (ref 39–117)
ALT: 9 U/L (ref 0–35)
ANION GAP: 11 (ref 5–15)
AST: 11 U/L (ref 0–37)
BUN: 19 mg/dL (ref 6–23)
CO2: 24 mEq/L (ref 19–32)
Calcium: 8.3 mg/dL — ABNORMAL LOW (ref 8.4–10.5)
Chloride: 105 mEq/L (ref 96–112)
Creatinine, Ser: 0.65 mg/dL (ref 0.50–1.10)
GFR calc Af Amer: >90 mL/min (ref >90)
GFR calc non Af Amer: >90 mL/min (ref >90)
Glucose, Bld: 135 mg/dL — ABNORMAL HIGH (ref 70–99)
POTASSIUM: 3.8 meq/L (ref 3.7–5.3)
Sodium: 140 mEq/L (ref 137–147)
Total Bilirubin: 0.3 mg/dL (ref 0.3–1.2)
Total Protein: 6.3 g/dL (ref 6.0–8.3)

## 2014-04-11 LAB — URINALYSIS, ROUTINE W REFLEX MICROSCOPIC
Glucose, UA: NEGATIVE mg/dL
HGB URINE DIPSTICK: NEGATIVE
LEUKOCYTES UA: NEGATIVE
Nitrite: NEGATIVE
PH: 5.5 (ref 5.0–8.0)
Protein, ur: 30 mg/dL — AB
Specific Gravity, Urine: 1.025 (ref 1.005–1.030)
Urobilinogen, UA: 0.2 mg/dL (ref 0.0–1.0)

## 2014-04-11 LAB — TROPONIN I
Troponin I: <0.3 ng/mL (ref <0.30)
Troponin I: <0.3 ng/mL (ref <0.30)
Troponin I: <0.3 ng/mL (ref <0.30)

## 2014-04-11 LAB — TSH: TSH: 0.317 u[IU]/mL — ABNORMAL LOW (ref 0.350–4.500)

## 2014-04-11 LAB — BASIC METABOLIC PANEL
Anion gap: 14 (ref 5–15)
BUN: 19 mg/dL (ref 6–23)
CO2: 23 mEq/L (ref 19–32)
CREATININE: 0.73 mg/dL (ref 0.50–1.10)
Calcium: 8.4 mg/dL (ref 8.4–10.5)
Chloride: 103 mEq/L (ref 96–112)
GFR calc non Af Amer: 88 mL/min — ABNORMAL LOW (ref >90)
Glucose, Bld: 175 mg/dL — ABNORMAL HIGH (ref 70–99)
Potassium: 3.6 mEq/L — ABNORMAL LOW (ref 3.7–5.3)
Sodium: 140 mEq/L (ref 137–147)

## 2014-04-11 LAB — GLUCOSE, CAPILLARY
GLUCOSE-CAPILLARY: 171 mg/dL — AB (ref 70–99)
GLUCOSE-CAPILLARY: 153 mg/dL — AB (ref 70–99)
Glucose-Capillary: 133 mg/dL — ABNORMAL HIGH (ref 70–99)
Glucose-Capillary: 151 mg/dL — ABNORMAL HIGH (ref 70–99)

## 2014-04-11 LAB — HEMOGLOBIN A1C
Hgb A1c MFr Bld: 6.9 % — ABNORMAL HIGH (ref <5.7)
Mean Plasma Glucose: 151 mg/dL — ABNORMAL HIGH (ref <117)

## 2014-04-11 LAB — MRSA PCR SCREENING: MRSA BY PCR: NEGATIVE

## 2014-04-11 LAB — URINE MICROSCOPIC-ADD ON

## 2014-04-11 LAB — T4, FREE: Free T4: 1.44 ng/dL (ref 0.80–1.80)

## 2014-04-11 LAB — LACTIC ACID, PLASMA
LACTIC ACID, VENOUS: 4.6 mmol/L — AB (ref 0.5–2.2)
Lactic Acid, Venous: 1.3 mmol/L (ref 0.5–2.2)

## 2014-04-11 MED ORDER — LEVOTHYROXINE SODIUM 25 MCG PO TABS
125.0000 ug | ORAL_TABLET | Freq: Every day | ORAL | Status: DC
Start: 2014-04-11 — End: 2014-04-12
  Administered 2014-04-11 – 2014-04-12 (×2): 125 ug via ORAL
  Filled 2014-04-11 (×4): qty 1

## 2014-04-11 MED ORDER — APIXABAN 5 MG PO TABS
5.0000 mg | ORAL_TABLET | Freq: Two times a day (BID) | ORAL | Status: DC
  Administered 2014-04-11 – 2014-04-12 (×3): 5 mg via ORAL
  Filled 2014-04-11 (×3): qty 1

## 2014-04-11 MED ORDER — ONDANSETRON HCL 4 MG/2ML IJ SOLN: 4.0000 mg | Freq: Four times a day (QID) | INTRAMUSCULAR | Status: DC | PRN

## 2014-04-11 MED ORDER — DRONABINOL 2.5 MG PO CAPS
2.5000 mg | ORAL_CAPSULE | Freq: Two times a day (BID) | ORAL | Status: DC
  Administered 2014-04-11 – 2014-04-12 (×3): 2.5 mg via ORAL
  Filled 2014-04-11 (×3): qty 1

## 2014-04-11 MED ORDER — INSULIN ASPART 100 UNIT/ML ~~LOC~~ SOLN
0.0000 [IU] | Freq: Three times a day (TID) | SUBCUTANEOUS | Status: DC
  Administered 2014-04-11: 3 [IU] via SUBCUTANEOUS
  Administered 2014-04-11: 2 [IU] via SUBCUTANEOUS
  Administered 2014-04-11 – 2014-04-12 (×2): 3 [IU] via SUBCUTANEOUS

## 2014-04-11 MED ORDER — MEGESTROL ACETATE 400 MG/10ML PO SUSP
800.0000 mg | Freq: Every day | ORAL | Status: DC
  Administered 2014-04-11 – 2014-04-12 (×2): 800 mg via ORAL
  Filled 2014-04-11 (×2): qty 20

## 2014-04-11 MED ORDER — ACETAMINOPHEN 650 MG RE SUPP: 650.0000 mg | Freq: Four times a day (QID) | RECTAL | Status: DC | PRN

## 2014-04-11 MED ORDER — MEGESTROL ACETATE 625 MG/5ML PO SUSP: 625.0000 mg | Freq: Every day | ORAL | Status: DC

## 2014-04-11 MED ORDER — SODIUM CHLORIDE 0.9 % IJ SOLN: 3.0000 mL | Freq: Two times a day (BID) | INTRAMUSCULAR | Status: DC

## 2014-04-11 MED ORDER — PANTOPRAZOLE SODIUM 40 MG PO TBEC
40.0000 mg | DELAYED_RELEASE_TABLET | Freq: Two times a day (BID) | ORAL | Status: DC
Start: 2014-04-11 — End: 2014-04-12
  Administered 2014-04-11 – 2014-04-12 (×3): 40 mg via ORAL
  Filled 2014-04-11 (×3): qty 1

## 2014-04-11 MED ORDER — SODIUM CHLORIDE 0.9 % IV SOLN
INTRAVENOUS | Status: DC
Start: 2014-04-11 — End: 2014-04-11
  Administered 2014-04-11: 03:00:00 via INTRAVENOUS

## 2014-04-11 MED ORDER — ACETAMINOPHEN 325 MG PO TABS: 650.0000 mg | ORAL_TABLET | Freq: Four times a day (QID) | ORAL | Status: DC | PRN

## 2014-04-11 MED ORDER — INSULIN ASPART 100 UNIT/ML ~~LOC~~ SOLN
0.0000 [IU] | Freq: Every day | SUBCUTANEOUS | Status: DC
  Administered 2014-04-11: 3 [IU] via SUBCUTANEOUS

## 2014-04-11 MED ORDER — CETYLPYRIDINIUM CHLORIDE 0.05 % MT LIQD
7.0000 mL | Freq: Two times a day (BID) | OROMUCOSAL | Status: DC
  Administered 2014-04-11 – 2014-04-12 (×3): 7 mL via OROMUCOSAL

## 2014-04-11 MED ORDER — ONDANSETRON HCL 4 MG PO TABS: 4.0000 mg | ORAL_TABLET | Freq: Four times a day (QID) | ORAL | Status: DC | PRN

## 2014-04-11 MED ORDER — METAXALONE 800 MG PO TABS
800.0000 mg | ORAL_TABLET | Freq: Three times a day (TID) | ORAL | Status: DC
  Administered 2014-04-11 – 2014-04-12 (×4): 800 mg via ORAL
  Filled 2014-04-11 (×11): qty 1

## 2014-04-11 MED ORDER — POTASSIUM CHLORIDE CRYS ER 20 MEQ PO TBCR
40.0000 meq | EXTENDED_RELEASE_TABLET | Freq: Once | ORAL | Status: AC
  Administered 2014-04-11: 40 meq via ORAL
  Filled 2014-04-11: qty 2

## 2014-04-11 MED ORDER — SODIUM CHLORIDE 0.9 % IV SOLN
INTRAVENOUS | Status: DC
  Administered 2014-04-11: 01:00:00 via INTRAVENOUS

## 2014-04-11 MED ORDER — METOPROLOL TARTRATE 25 MG PO TABS
25.0000 mg | ORAL_TABLET | Freq: Two times a day (BID) | ORAL | Status: DC
  Administered 2014-04-11 – 2014-04-12 (×3): 25 mg via ORAL
  Filled 2014-04-11 (×3): qty 1

## 2014-04-11 MED ORDER — LOPERAMIDE HCL 2 MG PO CAPS: 2.0000 mg | ORAL_CAPSULE | Freq: Three times a day (TID) | ORAL | Status: DC | PRN

## 2014-04-11 MED ORDER — LEVETIRACETAM 250 MG PO TABS
250.0000 mg | ORAL_TABLET | Freq: Two times a day (BID) | ORAL | Status: DC
  Administered 2014-04-11 – 2014-04-12 (×3): 250 mg via ORAL
  Filled 2014-04-11 (×3): qty 1

## 2014-04-11 MED ORDER — SODIUM CHLORIDE 0.9 % IV SOLN
INTRAVENOUS | Status: AC
  Administered 2014-04-11 (×2): via INTRAVENOUS

## 2014-04-11 MED ORDER — LORAZEPAM 0.5 MG PO TABS: 0.5000 mg | ORAL_TABLET | Freq: Every evening | ORAL | Status: DC | PRN

## 2014-04-11 MED ORDER — PNEUMOCOCCAL VAC POLYVALENT 25 MCG/0.5ML IJ INJ
0.5000 mL | INJECTION | INTRAMUSCULAR | Status: AC
  Administered 2014-04-12: 0.5 mL via INTRAMUSCULAR
  Filled 2014-04-11: qty 0.5

## 2014-04-11 MED ORDER — INFLUENZA VAC SPLIT QUAD 0.5 ML IM SUSY
0.5000 mL | PREFILLED_SYRINGE | INTRAMUSCULAR | Status: AC
  Administered 2014-04-12: 0.5 mL via INTRAMUSCULAR
  Filled 2014-04-11: qty 0.5

## 2014-04-11 MED ORDER — GLUCERNA SHAKE PO LIQD
237.0000 mL | Freq: Three times a day (TID) | ORAL | Status: DC
  Administered 2014-04-11 – 2014-04-12 (×2): 237 mL via ORAL

## 2014-04-11 NOTE — Progress Notes (Signed)
UR completed 

## 2014-04-11 NOTE — Consult Note (Signed)
CARDIOLOGY CONSULT NOTE  Patient ID: [name] MRN: [id number] DOB/AGE: 1951-05-30 63 y.o.  Admit date: 04/10/2014 Primary Physician [name], MD  Reason for Consultation: syncope, atrial fibrillation, diastolic heart failure  HPI: The patient is a 63 yr old woman with a history of chronic diarrhea, weight loss, atrial fibrillation, prior episodes of syncope, type 2 diabetes, HTN, hypothyroidism, and chronic diastolic heart failure admitted with syncope. She had been making a sandwich and was standing in front of the fridge. She then turned her head, felt dizzy and slightly nauseous and then passed out for less than a minute. It was witnessed by her daughter. She denies antecedent chest pain, palpitations, shortness of breath, and vomiting. There was no bowel or bladder incontinence. She has diarrheal episodes 4-5 times per day, and her daughter says she has not been drinking much nor eating much either. She has had a 30-50 lb weight loss in the past three months. She sees Dr. Darrick Penna (GI). She was recently taken off of Reglan and started on Cymbalta. She feels well today. She denies orthopnea, leg swelling, and PND.  Review of labs shows elevated lactate and mild hypokalemia. CXR showed atelectasis. Head CT showed small vessel ischemic disease. Troponins normal.  Recent cardiac catheterization demonstrated widely patent coronary arteries and mild pulmonary hypertension. ECG on admission demonstrated atrial fibrillation with LAFB. Today's ECG demonstrates a nonspecific ST-T abnormality in lateral leads and PVC's with underlying atrial fibrillation. Review of telemetry demonstrates atrial fibrillation. There was a pause this morning lasting less than 2 seconds (insignificant).   Allergies  Allergen Reactions  . Ibuprofen     Upsets her stomach and causes abdominal pain  . Celexa [Citalopram Hydrobromide]   . Codeine Nausea And Vomiting    HALLUCINATIONS     Current Facility-Administered Medications  Medication Dose Route Frequency Provider Last Rate Last Dose  . 0.9 %  sodium chloride infusion   Intravenous Continuous Osvaldo Shipper, MD 100 mL/hr at 04/11/14 0439    . acetaminophen (TYLENOL) tablet 650 mg  650 mg Oral Q6H PRN Osvaldo Shipper, MD       Or  . acetaminophen (TYLENOL) suppository 650 mg  650 mg Rectal Q6H PRN Osvaldo Shipper, MD      . antiseptic oral rinse (CPC / CETYLPYRIDINIUM CHLORIDE 0.05%) solution 7 mL  7 mL Mouth Rinse BID Osvaldo Shipper, MD   7 mL at 04/11/14 0954  . apixaban (ELIQUIS) tablet 5 mg  5 mg Oral BID Osvaldo Shipper, MD   5 mg at 04/11/14 0956  . dronabinol (MARINOL) capsule 2.5 mg  2.5 mg Oral BID AC Osvaldo Shipper, MD   2.5 mg at 04/11/14 0954  . [START ON 04/12/2014] Influenza vac split quadrivalent PF (FLUARIX) injection 0.5 mL  0.5 mL Intramuscular Tomorrow-1000 Osvaldo Shipper, MD      . insulin aspart (novoLOG) injection 0-15 Units  0-15 Units Subcutaneous TID WC Osvaldo Shipper, MD   2 Units at 04/11/14 0956  . insulin aspart (novoLOG) injection 0-5 Units  0-5 Units Subcutaneous QHS Osvaldo Shipper, MD      . levETIRAcetam (KEPPRA) tablet 250 mg  250 mg Oral BID Osvaldo Shipper, MD   250 mg at 04/11/14 0955  . levothyroxine (SYNTHROID, LEVOTHROID) tablet 125 mcg  125 mcg Oral QAC breakfast Osvaldo Shipper, MD   125 mcg at 04/11/14 0954  . loperamide (IMODIUM) capsule 2 mg  2 mg Oral TID PRN Osvaldo Shipper, MD      .  LORazepam (ATIVAN) tablet 0.5 mg  0.5 mg Oral QHS PRN Osvaldo ShipperGokul Krishnan, MD      . megestrol (MEGACE) 400 MG/10ML suspension 800 mg  800 mg Oral Daily Maryann Mikhail, DO   800 mg at 04/11/14 0954  . metaxalone (SKELAXIN) tablet 800 mg  800 mg Oral TID Osvaldo ShipperGokul Krishnan, MD   800 mg at 04/11/14 0956  . metoprolol tartrate (LOPRESSOR) tablet 25 mg  25 mg Oral BID Osvaldo ShipperGokul Krishnan, MD   25 mg at 04/11/14 0955  . ondansetron (ZOFRAN) tablet 4 mg  4 mg Oral Q6H PRN Osvaldo ShipperGokul Krishnan, MD       Or  . ondansetron  (ZOFRAN) injection 4 mg  4 mg Intravenous Q6H PRN Osvaldo ShipperGokul Krishnan, MD      . pantoprazole (PROTONIX) EC tablet 40 mg  40 mg Oral BID AC Osvaldo ShipperGokul Krishnan, MD   40 mg at 04/11/14 0956  . [START ON 04/12/2014] pneumococcal 23 valent vaccine (PNU-IMMUNE) injection 0.5 mL  0.5 mL Intramuscular Tomorrow-1000 Osvaldo ShipperGokul Krishnan, MD      . sodium chloride 0.9 % injection 3 mL  3 mL Intravenous Q12H Osvaldo ShipperGokul Krishnan, MD        Past Medical History  Diagnosis Date  . Chest pain     Negative cardiac catheterization in 2002; negative stress nuclear study in 2008  . Congestive heart failure     Normal LV systolic function  . PAF (paroxysmal atrial fibrillation) 2009  . Chronic anticoagulation   . Diabetes mellitus, type 2     Insulin therapy; exacerbated by prednisone  . Syncope     Admitted 05/2009; magnetic resonance imagin/ MRA - negative; etiology thought to be orthostasis secondary to drugs and dehydration  . GERD (gastroesophageal reflux disease)   . IBS (irritable bowel syndrome)   . Hiatal hernia   . Gastroparesis     99% retention 05/2008 on GES  . Hyperlipidemia   . Hypertension   . Hypothyroid   . Chronic LBP     Surgical intervention in 1996  . Obesity   . OSA on CPAP   . Anemia     H/H of 10/30 with a normal MCV in 12/09  . Barrett's esophagus     Diagnosed 1995. Last EGD 2008.   . Seizures     Past Surgical History  Procedure Laterality Date  . Cardiovascular stress test  2008    Stress nuclear study  . Back surgery  1996  . Laparoscopic cholecystectomy  1990s  . Total abdominal hysterectomy  1999  . Laminectomy  1995    L4-L5  . Carpal tunnel release  1994  . Esophagogastroduodenoscopy  2008    Barrett's without dysplasia. esphagus dilated. antral erosions, h.pylori serologies negative.  . Colonoscopy  11/2011    Dr. Darrick Pennafields: Internal hemorrhoids, mild diverticulosis. Random colon biopsies negative.  . Givens capsule study  12/07/2011    Proximal small bowel, rare AVM. Distal  small bowel, multiple ulcers noted  . Esophagogastroduodenoscopy  11/2011    Dr. Darrick PennaFields: Barrett's esophagus, mild gastritis, diverticulum in the second portion of the duodenum repeat EGD 3 years. Small bowel biopsies negative. Gastric biopsy show reactive gastropathy but no H. pylori. Esophageal biopsies consistent with GERD. Next EGD 11/2014  . Givens capsule study N/A 09/27/2013    Distal small bowel ulcers extending to TI.  Marland Kitchen. Givens capsule study N/A 10/10/2013    Procedure: GIVENS CAPSULE STUDY;  Surgeon: West BaliSandi L Fields, MD;  Location: [facility];  Service:  Endoscopy;  Laterality: N/A;  7:30  . Colonoscopy N/A 11/08/2013    SLF: Normal mucosa in the terminal ileum/The colon IS redundant/  Moderate diverticulosis throughout the entire colon. ileum bx benign. colon bx benign  . Esophageal biopsy N/A 11/08/2013    Procedure: BIOPSY  / Tissue sampling / ulcers present in small intestine;  Surgeon: West Bali, MD;  Location: [facility];  Service: Endoscopy;  Laterality: N/A;  . Cardiac catheterization  2002    History   Social History  . Marital Status: Married    Spouse Name: N/A    Number of Children: N/A  . Years of Education: N/A   Occupational History  . 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     Social History Main Topics  . Smoking status: Former Smoker -- 15 years    Quit date: 07/01/1983  . Smokeless tobacco: Never Used     Comment: Remote  . Alcohol Use: No     Comment: last etoh one year ago, never frequent  . Drug Use: No  . Sexual Activity: Yes    Birth Control/ Protection: None, Surgical   Other Topics Concern  . Not on file   Social History Narrative   Sedentary   4 children, "blended family"     No family history of premature CAD in 1st degree relatives.  Prior to Admission medications   Medication Sig Start Date End Date Taking? Authorizing Provider  apixaban (ELIQUIS) 5 MG TABS tablet Take 1 tablet (5 mg total) by mouth 2 (two) times daily.  12/16/13  Yes Antoine Poche, MD  diltiazem (CARDIZEM CD) 240 MG 24 hr capsule Take 240 mg by mouth every evening. 07/04/13  Yes [name], MD  dronabinol (MARINOL) 2.5 MG capsule Take 2.5 mg by mouth 2 (two) times daily before a meal.   Yes Historical Provider, MD  DULoxetine (CYMBALTA) 60 MG capsule Take 60 mg by mouth daily.   Yes Historical Provider, MD  furosemide (LASIX) 40 MG tablet Take 20 mg by mouth 3 (three) times a week. Takes on Monday, Wednesday and Thursday. 12/03/13  Yes Historical Provider, MD  glipiZIDE (GLUCOTROL) 10 MG tablet Take 10 mg by mouth daily before breakfast.    Yes Historical Provider, MD  HYDROcodone-acetaminophen (NORCO/VICODIN) 5-325 MG per tablet Take 2 tablets by mouth every 6 (six) hours as needed for moderate pain.   Yes Historical Provider, MD  Insulin Glargine (LANTUS SOLOSTAR) 100 UNIT/ML Solostar Pen Inject 20-50 Units into the skin See admin instructions. Patient takes 20 units every night at bedtime.  She takes 50 units if her blood sugar is above 300.   Yes Historical Provider, MD  levETIRAcetam (KEPPRA) 250 MG tablet Take 1 tablet (250 mg total) by mouth 2 (two) times daily. 11/10/12  Yes [name], MD  levothyroxine (SYNTHROID, LEVOTHROID) 125 MCG tablet Take 125 mcg by mouth daily.   Yes Historical Provider, MD  LORazepam (ATIVAN) 1 MG tablet Take 1-2 mg by mouth daily as needed for anxiety or sleep. For anxiety   Yes Historical Provider, MD  metaxalone (SKELAXIN) 800 MG tablet Take 800 mg by mouth 3 (three) times daily.  03/07/14  Yes Historical Provider, MD  metFORMIN (GLUCOPHAGE) 500 MG tablet Take 1,000 mg by mouth 2 (two) times daily with a meal.    Yes Historical Provider, MD  metoprolol (LOPRESSOR) 100 MG tablet Take 1 tablet (100 mg total) by mouth 2 (two) times  daily. 01/20/14  Yes Jodelle Gross, NP  nitroGLYCERIN (NITROSTAT) 0.4 MG SL tablet Place 0.4 mg under the tongue every 5 (five) minutes as needed for chest pain. For chest  pain    Yes Historical Provider, MD  omeprazole (PRILOSEC) 20 MG capsule Take 20 mg by mouth 2 (two) times daily.     Yes Historical Provider, MD  ondansetron (ZOFRAN) 4 MG tablet Take 4 mg by mouth every 6 (six) hours as needed. For nausea 03/15/14  Yes Historical Provider, MD  potassium chloride SA (K-DUR,KLOR-CON) 20 MEQ tablet Take 20 mEq by mouth 3 (three) times daily.    Yes Historical Provider, MD     Review of systems complete and found to be negative unless listed above in HPI     Physical exam Blood pressure 97/69, pulse 73, temperature 97.9 F (36.6 C), temperature source Oral, resp. rate 16, height 5\' 5"  (1.651 m), weight 164 lb 14.5 oz (74.8 kg), SpO2 98.00%. General: NAD Neck: No JVD, no thyromegaly or thyroid nodule.  Lungs: Clear to auscultation bilaterally with normal respiratory effort. CV: Nondisplaced PMI. Irregular rhythm, normal S1/S2, no S3, no murmur.  No peripheral edema. Normal pedal pulses.  Abdomen: Soft, nontender, no hepatosplenomegaly, no distention.  Skin: Intact without lesions or rashes.  Neurologic: Alert and oriented x 3.  Psych: Normal affect. Extremities: No clubbing or cyanosis.  HEENT: Normal.   ECG: Most recent ECG reviewed.  Labs:   Lab Results  Component Value Date   WBC 8.7 04/11/2014   HGB 11.3* 04/11/2014   HCT 35.6* 04/11/2014   MCV 87.3 04/11/2014   PLT 220 04/11/2014    Recent Labs Lab 04/11/14 0810  NA 140  K 3.8  CL 105  CO2 24  BUN 19  CREATININE 0.65  CALCIUM 8.3*  PROT 6.3  BILITOT 0.3  ALKPHOS 49  ALT 9  AST 11  GLUCOSE 135*   Lab Results  Component Value Date   CKTOTAL 71 07/15/2009   CKMB 2.9 07/15/2009   TROPONINI <0.30 04/11/2014    Lab Results  Component Value Date   CHOL 77 09/02/2011   CHOL  Value: 136        ATP III CLASSIFICATION:  <200     mg/dL   Desirable  409-811  mg/dL   Borderline High  >=914    mg/dL   High        78/29/5621   CHOL 141 05/02/2009   Lab Results  Component Value Date   HDL  35* 09/02/2011   HDL 37* 05/30/2009   HDL 44 05/02/2009   Lab Results  Component Value Date   LDLCALC 24 09/02/2011   LDLCALC  Value: 60        Total Cholesterol/HDL:CHD Risk Coronary Heart Disease Risk Table                     Men   Women  1/2 Average Risk   3.4   3.3  Average Risk       5.0   4.4  2 X Average Risk   9.6   7.1  3 X Average Risk  23.4   11.0        Use the calculated Patient Ratio above and the CHD Risk Table to determine the patient's CHD Risk.        ATP III CLASSIFICATION (LDL):  <100     mg/dL   Optimal  308-657  mg/dL   Near or  Above                    Optimal  130-159  mg/dL   Borderline  161-096  mg/dL   High  >045     mg/dL   Very High 40/98/1191   LDLCALC 59 05/02/2009   Lab Results  Component Value Date   TRIG 88 09/02/2011   TRIG 194* 05/30/2009   TRIG 190 05/02/2009   Lab Results  Component Value Date   CHOLHDL 2.2 09/02/2011   CHOLHDL 3.7 05/30/2009   CHOLHDL 4.1 Ratio 04/09/2009   No results found for this basename: LDLDIRECT         Studies: Dg Chest 2 View  04/11/2014   CLINICAL DATA:  Acute onset of dizziness and hypotension. Syncope earlier tonight. Initial encounter.  EXAM: CHEST  2 VIEW  COMPARISON:  Chest radiograph performed 04/04/2014  FINDINGS: The lungs are well-aerated. Minimal bibasilar opacities likely reflect atelectasis. There is no evidence of pleural effusion or pneumothorax.  The heart is mildly enlarged. No acute osseous abnormalities are seen.  IMPRESSION: Minimal bibasilar opacities likely reflect atelectasis; lungs otherwise clear. Mild cardiomegaly.   Electronically Signed   By: Roanna Raider M.D.   On: 04/11/2014 04:20   Ct Head Wo Contrast  04/11/2014   CLINICAL DATA:  Acute onset of dizziness and hypotension. Syncope earlier today. Initial encounter.  EXAM: CT HEAD WITHOUT CONTRAST  TECHNIQUE: Contiguous axial images were obtained from the base of the skull through the vertex without intravenous contrast.  COMPARISON:  CT of the head  from 10/04/2012, and MRI/MRA of the brain performed 11/08/2012  FINDINGS: There is no evidence of acute infarction, mass lesion, or intra- or extra-axial hemorrhage on CT.  Prominence of the ventricles and sulci reflects mild cortical volume loss. Mild cerebellar atrophy is noted. Mild periventricular white matter change likely reflects small vessel ischemic microangiopathy.  The brainstem and fourth ventricle are within normal limits. The basal ganglia are unremarkable in appearance. The cerebral hemispheres demonstrate grossly normal gray-white differentiation. No mass effect or midline shift is seen.  There is no evidence of fracture; visualized osseous structures are unremarkable in appearance. The orbits are within normal limits. There is opacification of the right side of the sphenoid sinus. The remaining paranasal sinuses and mastoid air cells are well-aerated. No significant soft tissue abnormalities are seen.  IMPRESSION: 1. No acute intracranial pathology seen on CT. 2. Mild cortical volume loss and scattered small vessel ischemic microangiopathy. 3. Opacification of the right side of the sphenoid sinus.   Electronically Signed   By: Roanna Raider M.D.   On: 04/11/2014 04:19    ASSESSMENT AND PLAN:  1. Syncope: Likely vasovagal in etiology with orthostasis, diminished fluid and oral intake and hypotension. Now receiving IV fluids. I do not feel this represents a cardiac etiology in that I have not seen any long pauses on telemetry, and there is no evidence this was ischemia-mediated. One could consider an event monitor as an outpatient. Agree with holding diltiazem given hypotension and continuing metoprolol at a reduced dose of 25 mg bid. I am not convinced an echocardiogram will change management at this point and will d/c. No evidence of any seizure activity nor volume overload. 2. Atrial fibrillation: Hold diltiazem and continue metoprolol at a reduced dose of 25 mg bid. Continue Eliquis.  Consider outpatient event monitor if long pauses noted on telemetry. 3. Chronic diastolic heart failure: Appears dehydrated. Continue to hold Lasix with IV  fluid administration. 4. Type 2 diabetes: On insulin. 5. Hypothyroidism: On Synthroid. TSH low in June. Consider repeating. 6. Chronic anorexia, diarrhea, weight loss: Possibly due to gastroparesis. Reglan led to tardive dyskinesia. Follows with GI.  Signed: Prentice Docker, M.D., F.A.C.C.  04/11/2014, 10:20 AM

## 2014-04-11 NOTE — Progress Notes (Signed)
INITIAL NUTRITION ASSESSMENT  DOCUMENTATION CODES Per approved criteria  -Severe malnutrition in the context of chronic illness   Pt meets criteria for severe MALNUTRITION in the context of chronic illness as evidenced by 20% wt loss x 6 months, severe fat and muscle depletion  INTERVENTION: -Glucerna Shake po TID, each supplement provides 220 kcal and 10 grams of protein -Chopped meats per pt and family request  NUTRITION DIAGNOSIS: Inadequate oral intake related to decreased appetite as evidenced by severe fat and muscle depletion, 20% wt loss x 6 months.   Goal: Pt will meet >90% of estimated nutritional needs  Monitor:  Po/supplement intake, labs, weight changes, I/O's  Reason for Assessment: Consult to assess needs  63 y.o. female  Admitting Dx: Syncope  [name] is a 63 y.o. female with a past medical history of chronic atrial fibrillation on anticoagulation, diabetes, diastolic congestive heart failure, hypothyroidism, recently diagnosed with cognitive impairment, seizure disorder, who presented to the hospital after having a syncopal episode at home. There was no seizure activity. She does have chronic diarrhea and has about 4-5 watery stools on a daily basis. She is followed by Dr. Darrick PennaFields for the same. Over the last 3 months the daughter reports that the patient has lost about 30-50 pounds. No clear reason has been found except for poor oral intake. And which is thought to be related to her cognitive impairment. Recently, she was taken off of the Reglan.   ASSESSMENT: Pt admitted with syncope. Hx obtained by pt and multiple family members at bedside.  Pt and family reports poor appetite and weight loss PTA. Detailed wt hx obtained. Pt reports that pt started losing weight 3 years ago, from 250 down to 200#, in preparation for lap band surgery. She reports that due to a-fib, but never under went bariatric surgery, but continued to make healthy lifestyle modifications.  However, pt started losing weight unintentionally approximately one year ago. Wt hx reveals progressive wt loss over the past year, including and 18% wt loss x 6 months. Pt reports poor appetite over the past 3 months, which as progressively gotten worse. Pt denies issues chewing or swallowing foods, rather just doesn't have the desire to eat. Pt daughter reveals that she often chop pt's meats at home for ease of intake and requests that this be done in the hospital as well.  Diet recall consists of: breakfast- cereal, toast, or frozen breakfast sandwich; Lunch and Dinner: frozen meals, like Lean Cuisine. Pt daughter reports that she has transitioned to convenience foods so pt can maintain her independence. She reports either she or pt's husband is always with her. She also reports general decline in ADLs. Daughter also discloses that pt eats better when they go out to eat or when someone eats with her. Glucerna supplements have been tried at home, however, use has been inconsistent. Pt and family agreeable to using during hospitalization.  Intake has improved since hospitalization; pt reports eating 75% of lunch meal.  Pt daughter reports noncompliance to regular self-testing, but confirms good glycemic control.  Educated pt on importance of good PO intake to promote healing process. Discussed ways to maximize intake and protein in diet. Encouraged family to continue to eat with pt for socialization and to help pt to remember to eat.  Labs reviewed. Glucose: 135. CBGs: 133. Latest Hgb A1c reveals fair control.   Nutrition Focused Physical Exam:  Subcutaneous Fat:  Orbital Region: severe depletion Upper Arm Region: severe depletion Thoracic and Lumbar Region:  WDL  Muscle:  Temple Region: severe depletion Clavicle Bone Region: moderate depletion Clavicle and Acromion Bone Region: moderate depletion Scapular Bone Region:moderate depletion Dorsal Hand: moderate depletion Patellar Region: moderate  depletion Anterior Thigh Region: moderate depletion Posterior Calf Region: severe depletion  Edema: none present  Height: Ht Readings from Last 1 Encounters:  04/11/14 5\' 5"  (1.651 m)    Weight: Wt Readings from Last 1 Encounters:  04/11/14 164 lb 14.5 oz (74.8 kg)    Ideal Body Weight: 125#  % Ideal Body Weight: 131%  Wt Readings from Last 50 Encounters:  04/11/14 164 lb 14.5 oz (74.8 kg)  04/04/14 161 lb 1 oz (73.057 kg)  03/31/14 169 lb 6.4 oz (76.839 kg)  01/10/14 186 lb (84.369 kg)  01/10/14 186 lb (84.369 kg)  01/07/14 186 lb (84.369 kg)  01/03/14 186 lb (84.369 kg)  12/29/13 186 lb (84.369 kg)  12/25/13 187 lb (84.823 kg)  12/21/13 196 lb (88.905 kg)  12/19/13 196 lb (88.905 kg)  12/03/13 196 lb 6.4 oz (89.086 kg)  11/08/13 200 lb (90.719 kg)  11/08/13 200 lb (90.719 kg)  10/13/13 200 lb (90.719 kg)  10/10/13 200 lb (90.719 kg)  10/10/13 200 lb (90.719 kg)  09/27/13 200 lb (90.719 kg)  09/27/13 200 lb (90.719 kg)  09/05/13 207 lb (93.895 kg)  08/26/13 205 lb 9.6 oz (93.26 kg)  07/29/13 215 lb (97.523 kg)  07/11/13 230 lb (104.327 kg)  07/01/13 224 lb 6.9 oz (101.8 kg)  06/18/13 225 lb (102.059 kg)  06/16/13 230 lb (104.327 kg)  12/26/12 221 lb 4 oz (100.358 kg)  11/08/12 223 lb (101.152 kg)  11/01/12 221 lb (100.245 kg)  10/26/12 227 lb 1.9 oz (103.021 kg)  10/22/12 223 lb (101.152 kg)  10/04/12 221 lb (100.245 kg)  07/20/12 222 lb (100.699 kg)  05/15/12 234 lb (106.142 kg)  04/04/12 231 lb 6.4 oz (104.962 kg)  01/20/12 234 lb (106.142 kg)  12/07/11 237 lb (107.502 kg)  12/07/11 237 lb (107.502 kg)  11/11/11 235 lb (106.595 kg)  11/11/11 235 lb (106.595 kg)  09/29/11 242 lb (109.77 kg)  09/22/11 235 lb (106.595 kg)  09/19/11 232 lb 3.2 oz (105.325 kg)  09/14/11 230 lb (104.327 kg)  09/14/11 230 lb (104.327 kg)  09/02/11 242 lb 1 oz (109.8 kg)  12/28/09 267 lb (121.11 kg)  08/26/09 252 lb (114.306 kg)  03/31/09 250 lb (113.399 kg)  09/26/08  276 lb (125.193 kg)   Usual Body Weight: 225#  % Usual Body Weight: 73%  BMI:  Body mass index is 27.44 kg/(m^2). Overweight  Estimated Nutritional Needs: Kcal: 2000-2200 Protein: 97-107 grams Fluid: 2.0-2.2 L  Skin: WDL  Diet Order: Carb Control  EDUCATION NEEDS: -Education needs addressed   Intake/Output Summary (Last 24 hours) at 04/11/14 1230 Last data filed at 04/11/14 0400  Gross per 24 hour  Intake    325 ml  Output     15 ml  Net    310 ml    Last BM: 04/10/14  Labs:   Recent Labs Lab 04/04/14 1819 04/10/14 2333 04/11/14 0810  NA 136* 140 140  K 3.5* 3.6* 3.8  CL 94* 103 105  CO2 24 23 24   BUN 12 19 19   CREATININE 0.72 0.73 0.65  CALCIUM 9.1 8.4 8.3*  GLUCOSE 145* 175* 135*    CBG (last 3)   Recent Labs  04/11/14 0802  GLUCAP 133*   Lab Results  Component Value Date   HGBA1C 7.0*  12/19/2013   Scheduled Meds: . antiseptic oral rinse  7 mL Mouth Rinse BID  . apixaban  5 mg Oral BID  . dronabinol  2.5 mg Oral BID AC  . [START ON 04/12/2014] Influenza vac split quadrivalent PF  0.5 mL Intramuscular Tomorrow-1000  . insulin aspart  0-15 Units Subcutaneous TID WC  . insulin aspart  0-5 Units Subcutaneous QHS  . levETIRAcetam  250 mg Oral BID  . levothyroxine  125 mcg Oral QAC breakfast  . megestrol  800 mg Oral Daily  . metaxalone  800 mg Oral TID  . metoprolol  25 mg Oral BID  . pantoprazole  40 mg Oral BID AC  . [START ON 04/12/2014] pneumococcal 23 valent vaccine  0.5 mL Intramuscular Tomorrow-1000  . sodium chloride  3 mL Intravenous Q12H    Continuous Infusions: . sodium chloride 100 mL/hr at 04/11/14 1147    Past Medical History  Diagnosis Date  . Chest pain     Negative cardiac catheterization in 2002; negative stress nuclear study in 2008  . Congestive heart failure     Normal LV systolic function  . PAF (paroxysmal atrial fibrillation) 2009  . Chronic anticoagulation   . Diabetes mellitus, type 2     Insulin therapy;  exacerbated by prednisone  . Syncope     Admitted 05/2009; magnetic resonance imagin/ MRA - negative; etiology thought to be orthostasis secondary to drugs and dehydration  . GERD (gastroesophageal reflux disease)   . IBS (irritable bowel syndrome)   . Hiatal hernia   . Gastroparesis     99% retention 05/2008 on GES  . Hyperlipidemia   . Hypertension   . Hypothyroid   . Chronic LBP     Surgical intervention in 1996  . Obesity   . OSA on CPAP   . Anemia     H/H of 10/30 with a normal MCV in 12/09  . Barrett's esophagus     Diagnosed 1995. Last EGD 2008.   . Seizures     Past Surgical History  Procedure Laterality Date  . Cardiovascular stress test  2008    Stress nuclear study  . Back surgery  1996  . Laparoscopic cholecystectomy  1990s  . Total abdominal hysterectomy  1999  . Laminectomy  1995    L4-L5  . Carpal tunnel release  1994  . Esophagogastroduodenoscopy  2008    Barrett's without dysplasia. esphagus dilated. antral erosions, h.pylori serologies negative.  . Colonoscopy  11/2011    Dr. Darrick Penna: Internal hemorrhoids, mild diverticulosis. Random colon biopsies negative.  . Givens capsule study  12/07/2011    Proximal small bowel, rare AVM. Distal small bowel, multiple ulcers noted  . Esophagogastroduodenoscopy  11/2011    Dr. Darrick Penna: Barrett's esophagus, mild gastritis, diverticulum in the second portion of the duodenum repeat EGD 3 years. Small bowel biopsies negative. Gastric biopsy show reactive gastropathy but no H. pylori. Esophageal biopsies consistent with GERD. Next EGD 11/2014  . Givens capsule study N/A 09/27/2013    Distal small bowel ulcers extending to TI.  Marland Kitchen Givens capsule study N/A 10/10/2013    Procedure: GIVENS CAPSULE STUDY;  Surgeon: West Bali, MD;  Location: [facility];  Service: Endoscopy;  Laterality: N/A;  7:30  . Colonoscopy N/A 11/08/2013    SLF: Normal mucosa in the terminal ileum/The colon IS redundant/  Moderate diverticulosis throughout the  entire colon. ileum bx benign. colon bx benign  . Esophageal biopsy N/A 11/08/2013    Procedure: BIOPSY  /  Tissue sampling / ulcers present in small intestine;  Surgeon: West Bali, MD;  Location: [facility];  Service: Endoscopy;  Laterality: N/A;  . Cardiac catheterization  2002    Shmuel Girgis A. Mayford Knife, RD, LDN Pager: [phone]

## 2014-04-11 NOTE — ED Provider Notes (Signed)
CSN: [id number]     Arrival date & time 04/10/14  2237 History  This chart was scribed for Hanley Seamen, MD by Milly Jakob, ED Scribe. The patient was seen in room APA02/APA02. Patient's care was started at 12:55 AM.    Chief Complaint  Patient presents with  . Weakness   The history is provided by the patient. No language interpreter was used.   HPI Comments: EMALEE BURK is a 63 y.o. female who presents to the Emergency Department after a syncopal event at 3.5 hours ago. Pt states that she woke up on the floor. She denies any chest pain, palpitations, or SOB.  She reports baseline weakness and difficulty eating for the past six months. Pt reports that she feels back to baseline currently. Her relative reports that she has been taking her medications without eating which has caused persistent nausea.  Past Medical History  Diagnosis Date  . Chest pain     Negative cardiac catheterization in 2002; negative stress nuclear study in 2008  . Congestive heart failure     Normal LV systolic function  . PAF (paroxysmal atrial fibrillation) 2009  . Chronic anticoagulation   . Diabetes mellitus, type 2     Insulin therapy; exacerbated by prednisone  . Syncope     Admitted 05/2009; magnetic resonance imagin/ MRA - negative; etiology thought to be orthostasis secondary to drugs and dehydration  . GERD (gastroesophageal reflux disease)   . IBS (irritable bowel syndrome)   . Hiatal hernia   . Gastroparesis     99% retention 05/2008 on GES  . Hyperlipidemia   . Hypertension   . Hypothyroid   . Chronic LBP     Surgical intervention in 1996  . Obesity   . OSA on CPAP   . Anemia     H/H of 10/30 with a normal MCV in 12/09  . Barrett's esophagus     Diagnosed 1995. Last EGD 2008.   . Seizures    Past Surgical History  Procedure Laterality Date  . Cardiovascular stress test  2008    Stress nuclear study  . Back surgery  1996  . Laparoscopic cholecystectomy  1990s  . Total abdominal  hysterectomy  1999  . Laminectomy  1995    L4-L5  . Carpal tunnel release  1994  . Esophagogastroduodenoscopy  2008    Barrett's without dysplasia. esphagus dilated. antral erosions, h.pylori serologies negative.  . Colonoscopy  11/2011    Dr. Darrick Penna: Internal hemorrhoids, mild diverticulosis. Random colon biopsies negative.  . Givens capsule study  12/07/2011    Proximal small bowel, rare AVM. Distal small bowel, multiple ulcers noted  . Esophagogastroduodenoscopy  11/2011    Dr. Darrick Penna: Barrett's esophagus, mild gastritis, diverticulum in the second portion of the duodenum repeat EGD 3 years. Small bowel biopsies negative. Gastric biopsy show reactive gastropathy but no H. pylori. Esophageal biopsies consistent with GERD. Next EGD 11/2014  . Givens capsule study N/A 09/27/2013    Distal small bowel ulcers extending to TI.  Marland Kitchen Givens capsule study N/A 10/10/2013    Procedure: GIVENS CAPSULE STUDY;  Surgeon: West Bali, MD;  Location: [facility];  Service: Endoscopy;  Laterality: N/A;  7:30  . Colonoscopy N/A 11/08/2013    SLF: Normal mucosa in the terminal ileum/The colon IS redundant/  Moderate diverticulosis throughout the entire colon. ileum bx benign. colon bx benign  . Esophageal biopsy N/A 11/08/2013    Procedure: BIOPSY  / Tissue sampling /  ulcers present in small intestine;  Surgeon: Sandi L Fields, MD;  Location: [facility];  Service: Endoscopy;  Laterality: N/A;  .West Bali Cardiac catheterization  2002   Family History  Problem Relation Age of Onset  . Hypertension Mother   . Alzheimer's disease Mother   . Stroke Mother   . Heart attack Mother   . Heart disease Neg Hx   . Hypertension Other   . Colon cancer Neg Hx    History  Substance Use Topics  . Smoking status: Former Smoker -- 15 years    Quit date: 07/01/1983  . Smokeless tobacco: Never Used     Comment: Remote  . Alcohol Use: No     Comment: last etoh one year ago, never frequent   OB History   Grav Para Term Preterm  Abortions TAB SAB Ect Mult Living   2 2 2             Review of Systems A complete 10 system review of systems was obtained and all systems are negative except as noted in the HPI and PMH.   Allergies  Ibuprofen; Celexa; and Codeine  Home Medications   Prior to Admission medications   Medication Sig Start Date End Date Taking? Authorizing Provider  apixaban (ELIQUIS) 5 MG TABS tablet Take 1 tablet (5 mg total) by mouth 2 (two) times daily. 12/16/13   Antoine PocheJonathan F Branch, MD  diltiazem (CARDIZEM CD) 240 MG 24 hr capsule Take 240 mg by mouth every evening. 07/04/13   Milana ObeyStephen D Knowlton, MD  dronabinol (MARINOL) 2.5 MG capsule Take 1 capsule (2.5 mg total) by mouth 2 (two) times daily before a meal. 03/31/14   Tiffany KocherLeslie S Lewis, PA-C  DULoxetine (CYMBALTA) 60 MG capsule Take 60 mg by mouth daily.    Historical Provider, MD  furosemide (LASIX) 40 MG tablet Take 20 mg by mouth daily. Three times a week 12/03/13   Historical Provider, MD  glipiZIDE (GLUCOTROL) 10 MG tablet Take 10 mg by mouth daily before breakfast.     Historical Provider, MD  HYDROcodone-acetaminophen (NORCO/VICODIN) 5-325 MG per tablet Take 2 tablets by mouth every 6 (six) hours as needed for moderate pain.    Historical Provider, MD  Insulin Glargine (LANTUS SOLOSTAR) 100 UNIT/ML Solostar Pen Inject 22 Units into the skin at bedtime.     Historical Provider, MD  levETIRAcetam (KEPPRA) 250 MG tablet Take 1 tablet (250 mg total) by mouth 2 (two) times daily. 11/10/12   Milana ObeyStephen D Knowlton, MD  levothyroxine (SYNTHROID, LEVOTHROID) 125 MCG tablet Take 125 mcg by mouth daily.    Historical Provider, MD  LORazepam (ATIVAN) 1 MG tablet Take 1-2 mg by mouth daily as needed for anxiety or sleep. For anxiety    Historical Provider, MD  megestrol (MEGACE ES) 625 MG/5ML suspension Take 5 mLs (625 mg total) by mouth daily. 04/05/14   Geoffery Lyonsouglas Delo, MD  metaxalone (SKELAXIN) 800 MG tablet Take 800 mg by mouth 3 (three) times daily.  03/07/14   Historical  Provider, MD  metFORMIN (GLUCOPHAGE) 500 MG tablet Take 1,000 mg by mouth 2 (two) times daily with a meal.     Historical Provider, MD  metoprolol (LOPRESSOR) 100 MG tablet Take 1 tablet (100 mg total) by mouth 2 (two) times daily. 01/20/14   Jodelle GrossKathryn M Lawrence, NP  nitroGLYCERIN (NITROSTAT) 0.4 MG SL tablet Place 0.4 mg under the tongue every 5 (five) minutes as needed for chest pain. For chest pain     Historical  Provider, MD  omeprazole (PRILOSEC) 20 MG capsule Take 20 mg by mouth 2 (two) times daily.      Historical Provider, MD  ondansetron (ZOFRAN) 4 MG tablet Take 4 mg by mouth every 6 (six) hours as needed. For nausea 03/15/14   Historical Provider, MD  potassium chloride SA (K-DUR,KLOR-CON) 20 MEQ tablet Take 20 mEq by mouth 3 (three) times daily.     Historical Provider, MD   Triage Vitals: BP 94/66  Pulse 64  Temp(Src) 98.2 F (36.8 C) (Oral)  Resp 16  Ht 5\' 5"  (1.651 m)  Wt 169 lb (76.658 kg)  BMI 28.12 kg/m2  SpO2 98% Physical Exam  General: Well-developed, well-nourished female in no acute distress; appearance consistent with age of record HENT: normocephalic; atraumatic Eyes: pupils equal, round and reactive to light; extraocular muscles intact Neck: supple Heart: regular rate and rhythm; distant sounds Lungs: clear to auscultation bilaterally; clear to auscultation; distant sounds Abdomen: soft; nondistended; nontender; no masses or hepatosplenomegaly; bowel sounds present Extremities: Arthritic changes; full range of motion; pulses normal Neurologic: Awake, alert and oriented; motor function intact in all extremities and symmetric; no facial droop Skin: Warm and dry Psychiatric: Normal mood and affect  ED Course  Procedures (including critical care time) DIAGNOSTIC STUDIES: Oxygen Saturation is 98% on room air, normal by my interpretation.    COORDINATION OF CARE: 1:00 AM-Discussed treatment plan which includes IV fluids and repeat Troponin with pt at bedside and  pt agreed to plan.    EKG Interpretation   Date/Time:  Thursday April 10 2014 22:47:01 EDT Ventricular Rate:  72 PR Interval:    QRS Duration: 110 QT Interval:  420 QTC Calculation: 460 R Axis:   -43 Text Interpretation:  Atrial fibrillation Left axis deviation Abnormal  R-wave progression, late transition Abnormal T, consider ischemia, lateral  leads No significant change was found Confirmed by Kanden Carey  MD, Jonny Ruiz  [phone]) on 04/10/2014 10:52:19 PM      MDM   Nursing notes and vitals signs, including pulse oximetry, reviewed.  Summary of this visit's results, reviewed by myself:  Labs:  Results for orders placed during the hospital encounter of 04/10/14 (from the past 24 hour(s))  LACTIC ACID, PLASMA     Status: Abnormal   Collection Time    04/10/14 11:26 PM      Result Value Ref Range   Lactic Acid, Venous 4.6 (*) 0.5 - 2.2 mmol/L  CBC WITH DIFFERENTIAL     Status: Abnormal   Collection Time    04/10/14 11:33 PM      Result Value Ref Range   WBC 8.9  4.0 - 10.5 K/uL   RBC 4.22  3.87 - 5.11 MIL/uL   Hemoglobin 12.0  12.0 - 15.0 g/dL   HCT 47.8  29.5 - 62.1 %   MCV 87.7  78.0 - 100.0 fL   MCH 28.4  26.0 - 34.0 pg   MCHC 32.4  30.0 - 36.0 g/dL   RDW 30.8 (*) 65.7 - 84.6 %   Platelets 216  150 - 400 K/uL   Neutrophils Relative % 62  43 - 77 %   Neutro Abs 5.6  1.7 - 7.7 K/uL   Lymphocytes Relative 28  12 - 46 %   Lymphs Abs 2.5  0.7 - 4.0 K/uL   Monocytes Relative 8  3 - 12 %   Monocytes Absolute 0.7  0.1 - 1.0 K/uL   Eosinophils Relative 2  0 - 5 %  Eosinophils Absolute 0.1  0.0 - 0.7 K/uL   Basophils Relative 0  0 - 1 %   Basophils Absolute 0.0  0.0 - 0.1 K/uL  BASIC METABOLIC PANEL     Status: Abnormal   Collection Time    04/10/14 11:33 PM      Result Value Ref Range   Sodium 140  137 - 147 mEq/L   Potassium 3.6 (*) 3.7 - 5.3 mEq/L   Chloride 103  96 - 112 mEq/L   CO2 23  19 - 32 mEq/L   Glucose, Bld 175 (*) 70 - 99 mg/dL   BUN 19  6 - 23 mg/dL    Creatinine, Ser 4.40  0.50 - 1.10 mg/dL   Calcium 8.4  8.4 - 34.7 mg/dL   GFR calc non Af Amer 88 (*) >90 mL/min   GFR calc Af Amer >90  >90 mL/min   Anion gap 14  5 - 15  TROPONIN I     Status: None   Collection Time    04/10/14 11:33 PM      Result Value Ref Range   Troponin I <0.30  <0.30 ng/mL  URINALYSIS, ROUTINE W REFLEX MICROSCOPIC     Status: Abnormal   Collection Time    04/11/14 12:12 AM      Result Value Ref Range   Color, Urine AMBER (*) YELLOW   APPearance HAZY (*) CLEAR   Specific Gravity, Urine 1.025  1.005 - 1.030   pH 5.5  5.0 - 8.0   Glucose, UA NEGATIVE  NEGATIVE mg/dL   Hgb urine dipstick NEGATIVE  NEGATIVE   Bilirubin Urine SMALL (*) NEGATIVE   Ketones, ur TRACE (*) NEGATIVE mg/dL   Protein, ur 30 (*) NEGATIVE mg/dL   Urobilinogen, UA 0.2  0.0 - 1.0 mg/dL   Nitrite NEGATIVE  NEGATIVE   Leukocytes, UA NEGATIVE  NEGATIVE  URINE MICROSCOPIC-ADD ON     Status: Abnormal   Collection Time    04/11/14 12:12 AM      Result Value Ref Range   Squamous Epithelial / LPF MANY (*) RARE   WBC, UA 3-6  <3 WBC/hpf   RBC / HPF 3-6  <3 RBC/hpf   Bacteria, UA MANY (*) RARE  TROPONIN I     Status: None   Collection Time    04/11/14  1:17 AM      Result Value Ref Range   Troponin I <0.30  <0.30 ng/mL     Hanley Seamen, MD 04/11/14 0231

## 2014-04-11 NOTE — Progress Notes (Signed)
[facility]                                                                              Patient Demographics  [name], is a 63 y.o. female, DOB - [birth year], BOF:[id number]  Admit date - 04/10/2014   Admitting Physician Osvaldo Shipper, MD  Outpatient Primary MD for the patient is [name], MD  LOS - 1   Chief Complaint  Patient presents with  . Weakness      HPI on 04/11/2014 [name] is a 63 y.o. female with a past medical history of chronic atrial fibrillation on anticoagulation, diabetes, diastolic congestive heart failure, hypothyroidism, recently diagnosed with cognitive impairment, seizure disorder, who presented to the hospital after having a syncopal episode at home. She was in the kitchen when she suddenly passed out. Her daughter was there and she witnessed the episode. Patient was unconscious for less than a minute. There was no seizure activity. Patient denied any chest pain, shortness of breath, or lightheadedness prior to this episode. Denied such symptoms currently. No nausea, vomiting, recently. She does have chronic diarrhea and has about 4-5 watery stools on a daily basis. She is followed by Dr. Darrick Penna for the same. Over the last 3 months the daughter reports that the patient has lost about 30-50 pounds. No clear reason has been found except for poor oral intake. And which is thought to be related to her cognitive impairment. About 2 months ago she was taken off of simvastatin for unclear reason and her dose of Reglan was reduced. Recently, she was taken off of the Reglan. She was also started on Cymbalta a few weeks ago. No changes to her cardiac medications recently. No fever. No chills.   Assessment & Plan   Patient was admitted early this morning. Assessment and plan by Dr.Gokul Rito Ehrlich.   Syncopal episode -Possibly vasovagal due to dehydration from poor oral intake versus diarrhea -CT of the head: No acute intracranial pathology -Chest  x-ray and urinary analysis did not suggest infection -Echocardiogram in June 2015 showed an EF of 50-55%, unable to evaluate LV dysfunction due to atrial fibrillation -Repeat echocardiogram was not recommended by cardiology  Elevated lactic Acid -Possibly secondary to hypovolemia -No infectious etiology at this time, no leukocytosis, patient is afebrile  Chronic atrial fibrillation on anticoagulation -Continue Eliquis -Continue metoprolol, however there was reduced -Diltiazem held  Chronic diarrhea -Continue Imodium as needed -Etiology unclear, patient is followed by gastroenterology  Abnormal weight loss -Possibly secondary to poor oral intake -Patient has had endoscopy as well as colonoscopy in the past which have been unremarkable -Will consult nutrition -Continue Megace  History cognitive impairment/dementia -Currently stable  Diabetes mellitus, type II -Hemoglobin A1c pending -Continue insulin sliding scale with CBG monitoring  Hypothyroidism -Continue Synthroid -Will check TSH  Chronic diastolic heart failure -Patient appears to be dehydrated, Lasix currently held  Code Status: Full  Family Communication: None at bedside  Disposition Plan: Admitted  Time Spent in minutes   30 minutes  Procedures  None  Consults   Cardiology  DVT Prophylaxis  Eliquis  Rigby Swamy D.O. on 04/11/2014 at 4:21 PM  Between 7am to 7pm - Pager -  [phone]  After 7pm go to [email or web address] - password [email or web address]  And look for the night coverage person covering for me after hours  [facility] Office  [phone]

## 2014-04-11 NOTE — H&P (Signed)
[facility] History and Physical  [name] MRN:[id number] DOB: 09/04/1950 DOA: 04/10/2014   PCP: KNOWLTON,STEPHEN D, MD  Specialists: She is followed by Dr. Sandy Fields, for chronic diarrhea. Also, followed by cardiology. Followed by Dr. Doonquah for history of seizures and dementia  Chief Complaint: Passed out  HPI: [name] is a 63 y.o. female with a past medical history of chronic atrial fibrillation on anticoagulation, diabetes, diastolic congestive heart failure, hypothyroidism, recently diagnosed with cognitive impairment, seizure disorder, who presented to the hospital after having a syncopal episode at home. She was in the kitchen when she suddenly passed out. Her daughter was there and she witnessed the episode. Patient was unconscious for less than a minute. There was no seizure activity. Patient denies any chest pain, shortness of breath, or lightheadedness prior to this episode. Denies such symptoms currently. No nausea, vomiting, recently. She does have chronic diarrhea and has about 4-5 watery stools on a daily basis. She is followed by Dr. Fields for the same. Over the last 3 months the daughter reports that the patient has lost about 30-50 pounds. No clear reason has been found except for poor oral intake. And which is thought to be related to her cognitive impairment. About 2 months ago she was taken off of simvastatin for unclear reason and her dose of Reglan was reduced. Recently, she was taken off of the Reglan. She was also started on Cymbalta a few weeks ago. No changes to her cardiac medications recently. No fever. No chills.  Home Medications: Prior to Admission medications   Medication Sig Start Date End Date Taking? Authorizing Provider  apixaban (ELIQUIS) 5 MG TABS tablet Take 1 tablet (5 mg total) by mouth 2 (two) times daily. 12/16/13   Jonathan F Branch, MD  diltiazem (CARDIZEM CD) 240 MG 24 hr capsule Take 240 mg by mouth every evening. 07/04/13   Stephen  D Knowlton, MD  dronabinol (MARINOL) 2.5 MG capsule Take 1 capsule (2.5 mg total) by mouth 2 (two) times daily before a meal. 03/31/14   Leslie S Lewis, PA-C  DULoxetine (CYMBALTA) 60 MG capsule Take 60 mg by mouth daily.    Historical Provider, MD  furosemide (LASIX) 40 MG tablet Take 20 mg by mouth daily. Three times a week 12/03/13   Historical Provider, MD  glipiZIDE (GLUCOTROL) 10 MG tablet Take 10 mg by mouth daily before breakfast.     Historical Provider, MD  HYDROcodone-acetaminophen (NORCO/VICODIN) 5-325 MG per tablet Take 2 tablets by mouth every 6 (six) hours as needed for moderate pain.    Historical Provider, MD  Insulin Glargine (LANTUS SOLOSTAR) 100 UNIT/ML Solostar Pen Inject 22 Units into the skin at bedtime.     Historical Provider, MD  levETIRAcetam (KEPPRA) 250 MG tablet Take 1 tablet (250 mg total) by mouth 2 (two) times daily. 11/10/12   Stephen D Knowlton, MD  levothyroxine (SYNTHROID, LEVOTHROID) 125 MCG tablet Take 125 mcg by mouth daily.    Historical Provider, MD  LORazepam (ATIVAN) 1 MG tablet Take 1-2 mg by mouth daily as needed for anxiety or sleep. For anxiety    Historical Provider, MD  megestrol (MEGACE ES) 625 MG/5ML suspension Take 5 mLs (625 mg total) by mouth daily. 04/05/14   Douglas Delo, MD  metaxalone (SKELAXIN) 800 MG tablet Take 800 mg by mouth 3 (three) times daily.  03/07/14   Historical Provider, MD  metFORMIN (GLUCOPHAGE) 500 MG tablet Take 1,000 mg by mouth 2 (two) times daily with   a meal.     Historical Provider, MD  metoprolol (LOPRESSOR) 100 MG tablet Take 1 tablet (100 mg total) by mouth 2 (two) times daily. 01/20/14   Lendon Colonel, NP  nitroGLYCERIN (NITROSTAT) 0.4 MG SL tablet Place 0.4 mg under the tongue every 5 (five) minutes as needed for chest pain. For chest pain     Historical Provider, MD  omeprazole (PRILOSEC) 20 MG capsule Take 20 mg by mouth 2 (two) times daily.      Historical Provider, MD  ondansetron (ZOFRAN) 4 MG tablet Take 4 mg by  mouth every 6 (six) hours as needed. For nausea 03/15/14   Historical Provider, MD  potassium chloride SA (K-DUR,KLOR-CON) 20 MEQ tablet Take 20 mEq by mouth 3 (three) times daily.     Historical Provider, MD    Allergies:  Allergies  Allergen Reactions  . Ibuprofen     Upsets her stomach and causes abdominal pain  . Celexa [Citalopram Hydrobromide]   . Codeine Nausea And Vomiting    HALLUCINATIONS    Past Medical History: Past Medical History  Diagnosis Date  . Chest pain     Negative cardiac catheterization in 2002; negative stress nuclear study in 2008  . Congestive heart failure     Normal LV systolic function  . PAF (paroxysmal atrial fibrillation) 2009  . Chronic anticoagulation   . Diabetes mellitus, type 2     Insulin therapy; exacerbated by prednisone  . Syncope     Admitted 05/2009; magnetic resonance imagin/ MRA - negative; etiology thought to be orthostasis secondary to drugs and dehydration  . GERD (gastroesophageal reflux disease)   . IBS (irritable bowel syndrome)   . Hiatal hernia   . Gastroparesis     99% retention 05/2008 on GES  . Hyperlipidemia   . Hypertension   . Hypothyroid   . Chronic LBP     Surgical intervention in 1996  . Obesity   . OSA on CPAP   . Anemia     H/H of 10/30 with a normal MCV in 12/09  . Barrett's esophagus     Diagnosed 1995. Last EGD 2008.   . Seizures     Past Surgical History  Procedure Laterality Date  . Cardiovascular stress test  2008    Stress nuclear study  . Back surgery  1996  . Laparoscopic cholecystectomy  1990s  . Total abdominal hysterectomy  1999  . Laminectomy  1995    L4-L5  . Carpal tunnel release  1994  . Esophagogastroduodenoscopy  2008    Barrett's without dysplasia. esphagus dilated. antral erosions, h.pylori serologies negative.  . Colonoscopy  11/2011    Dr. Oneida Alar: Internal hemorrhoids, mild diverticulosis. Random colon biopsies negative.  . Givens capsule study  12/07/2011    Proximal small  bowel, rare AVM. Distal small bowel, multiple ulcers noted  . Esophagogastroduodenoscopy  11/2011    Dr. Oneida Alar: Barrett's esophagus, mild gastritis, diverticulum in the second portion of the duodenum repeat EGD 3 years. Small bowel biopsies negative. Gastric biopsy show reactive gastropathy but no H. pylori. Esophageal biopsies consistent with GERD. Next EGD 11/2014  . Givens capsule study N/A 09/27/2013    Distal small bowel ulcers extending to TI.  Marland Kitchen Givens capsule study N/A 10/10/2013    Procedure: GIVENS CAPSULE STUDY;  Surgeon: Danie Binder, MD;  Location: [facility];  Service: Endoscopy;  Laterality: N/A;  7:30  . Colonoscopy N/A 11/08/2013    SLF: Normal mucosa in the terminal  ileum/The colon IS redundant/  Moderate diverticulosis throughout the entire colon. ileum bx benign. colon bx benign  . Esophageal biopsy N/A 11/08/2013    Procedure: BIOPSY  / Tissue sampling / ulcers present in small intestine;  Surgeon: Danie Binder, MD;  Location: [facility];  Service: Endoscopy;  Laterality: N/A;  . Cardiac catheterization  2002    Social History: She lives with her daughter. No smoking currently. Quit 30 years ago. No alcohol use. Decreased level of activity in the last few weeks. No assistive devices.  Family History:  Family History  Problem Relation Age of Onset  . Hypertension Mother   . Alzheimer's disease Mother   . Stroke Mother   . Heart attack Mother   . Heart disease Neg Hx   . Hypertension Other   . Colon cancer Neg Hx      Review of Systems - unable to do due to her cognitive, impairment  Physical Examination  Filed Vitals:   04/11/14 0215 04/11/14 0230 04/11/14 0245 04/11/14 0320  BP: 101/60 101/53 104/59 102/69  Pulse:  67 69 69  Temp:      TempSrc:      Resp: _0 Height:      Weight:      SpO2: 98% 98% 98% 98%    BP 102/69  Pulse 69  Temp(Src) 98.2 F (36.8 C) (Oral)  Resp 16  Ht 5' 5" (1.651 m)  Wt 76.658 kg (169 lb)  BMI 28.12 kg/m2   SpO2 98%  General appearance: alert, cooperative, appears stated age, distracted and no distress Head: Normocephalic, without obvious abnormality, atraumatic Eyes: conjunctivae/corneas clear. PERRL, EOM's intact.  Throat: lips, mucosa, and tongue normal; teeth and gums normal Neck: no adenopathy, no carotid bruit, no JVD, supple, symmetrical, trachea midline and thyroid not enlarged, symmetric, no tenderness/mass/nodules Resp: clear to auscultation bilaterally Cardio: S1-S2 is irregularly irregular. No S3, S4. No rubs, murmurs, or bruit. No pedal edema. GI: soft, non-tender; bowel sounds normal; no masses,  no organomegaly Extremities: extremities normal, atraumatic, no cyanosis or edema Pulses: 2+ and symmetric Skin: Skin color, texture, turgor normal. No rashes or lesions Lymph nodes: Cervical, supraclavicular, and axillary nodes normal. Neurologic: Alert. Oriented to person. Somewhat distracted. No facial asymmetry. Some tremors are noted in her tongue. Motor strength is equal bilaterally, upper and lower extremities. Gait not assessed.  Laboratory Data: Results for orders placed during the hospital encounter of 04/10/14 (from the past 48 hour(s))  LACTIC ACID, PLASMA     Status: Abnormal   Collection Time    04/10/14 11:26 PM      Result Value Ref Range   Lactic Acid, Venous 4.6 (*) 0.5 - 2.2 mmol/L  CBC WITH DIFFERENTIAL     Status: Abnormal   Collection Time    04/10/14 11:33 PM      Result Value Ref Range   WBC 8.9  4.0 - 10.5 K/uL   RBC 4.22  3.87 - 5.11 MIL/uL   Hemoglobin 12.0  12.0 - 15.0 g/dL   HCT 37.0  36.0 - 46.0 %   MCV 87.7  78.0 - 100.0 fL   MCH 28.4  26.0 - 34.0 pg   MCHC 32.4  30.0 - 36.0 g/dL   RDW 16.2 (*) 11.5 - 15.5 %   Platelets 216  150 - 400 K/uL   Neutrophils Relative % 62  43 - 77 %   Neutro Abs 5.6  1.7 - 7.7 K/uL  Lymphocytes Relative 28  12 - 46 %   Lymphs Abs 2.5  0.7 - 4.0 K/uL   Monocytes Relative 8  3 - 12 %   Monocytes Absolute 0.7  0.1 -  1.0 K/uL   Eosinophils Relative 2  0 - 5 %   Eosinophils Absolute 0.1  0.0 - 0.7 K/uL   Basophils Relative 0  0 - 1 %   Basophils Absolute 0.0  0.0 - 0.1 K/uL  BASIC METABOLIC PANEL     Status: Abnormal   Collection Time    04/10/14 11:33 PM      Result Value Ref Range   Sodium 140  137 - 147 mEq/L   Potassium 3.6 (*) 3.7 - 5.3 mEq/L   Chloride 103  96 - 112 mEq/L   CO2 23  19 - 32 mEq/L   Glucose, Bld 175 (*) 70 - 99 mg/dL   BUN 19  6 - 23 mg/dL   Creatinine, Ser 0.73  0.50 - 1.10 mg/dL   Calcium 8.4  8.4 - 10.5 mg/dL   GFR calc non Af Amer 88 (*) >90 mL/min   GFR calc Af Amer >90  >90 mL/min   Comment: (NOTE)     The eGFR has been calculated using the CKD EPI equation.     This calculation has not been validated in all clinical situations.     eGFR's persistently <90 mL/min signify possible Chronic Kidney     Disease.   Anion gap 14  5 - 15  TROPONIN I     Status: None   Collection Time    04/10/14 11:33 PM      Result Value Ref Range   Troponin I <0.30  <0.30 ng/mL   Comment:            Due to the release kinetics of cTnI,     a negative result within the first hours     of the onset of symptoms does not rule out     myocardial infarction with certainty.     If myocardial infarction is still suspected,     repeat the test at appropriate intervals.  URINALYSIS, ROUTINE W REFLEX MICROSCOPIC     Status: Abnormal   Collection Time    04/11/14 12:12 AM      Result Value Ref Range   Color, Urine AMBER (*) YELLOW   Comment: BIOCHEMICALS MAY BE AFFECTED BY COLOR   APPearance HAZY (*) CLEAR   Specific Gravity, Urine 1.025  1.005 - 1.030   pH 5.5  5.0 - 8.0   Glucose, UA NEGATIVE  NEGATIVE mg/dL   Hgb urine dipstick NEGATIVE  NEGATIVE   Bilirubin Urine SMALL (*) NEGATIVE   Ketones, ur TRACE (*) NEGATIVE mg/dL   Protein, ur 30 (*) NEGATIVE mg/dL   Urobilinogen, UA 0.2  0.0 - 1.0 mg/dL   Nitrite NEGATIVE  NEGATIVE   Leukocytes, UA NEGATIVE  NEGATIVE  URINE MICROSCOPIC-ADD  ON     Status: Abnormal   Collection Time    04/11/14 12:12 AM      Result Value Ref Range   Squamous Epithelial / LPF MANY (*) RARE   WBC, UA 3-6  <3 WBC/hpf   RBC / HPF 3-6  <3 RBC/hpf   Bacteria, UA MANY (*) RARE  TROPONIN I     Status: None   Collection Time    04/11/14  1:17 AM      Result Value Ref Range   Troponin I <0.30  <  0.30 ng/mL   Comment:            Due to the release kinetics of cTnI,     a negative result within the first hours     of the onset of symptoms does not rule out     myocardial infarction with certainty.     If myocardial infarction is still suspected,     repeat the test at appropriate intervals.    Radiology Reports: No results found.  Electrocardiogram: Atrial fibrillation in the 70s. Left axis deviation. No Q waves. Intervals otherwise normal. No concerning ST or T-wave changes are noted.  Problem List  Principal Problem:   Syncope Active Problems:   Atrial fibrillation   HYPOTENSION, ORTHOSTATIC   Chronic anticoagulation   Abnormal weight loss   Dementia   Chronic diarrhea   DM type 2 (diabetes mellitus, type 2)   Assessment: This is a 63-year-old, Caucasian female, who presents after a syncopal episode. She was noted to be orthostatic. She apparently had a similar episode about a week ago, and was evaluated in the ED. She was however discharged home at that time. Reason for her presentation is not entirely clear. Her pressure remains on the lower side. Lactic acid is elevated and is most likely due to hypovolemia. There is no source of infection. Chest x-ray is pending. Cymbalta can cause orthostatic hypotension and this is a new medication for her.  Plan: #1 syncope: Possibly due to orthostatic hypotension. Cymbalta could be contributing. She does have watery, loose stools, on a daily basis, which could be causing hypovolemia. Due to history of seizure disorder, and cognitive impairment we will proceed with CT head. Get chest x-ray. UA  does not suggest infection. We will give her IV fluids. Echocardiogram will be ordered. She'll be monitored on telemetry. Because of lower blood pressure we'll hold the Cardizem and decrease the dose of her metoprolol. Cardiology input could be useful. Troponins will be checked. She'll be seen by physical and occupational therapy.  #2 elevated lactic acid level: Probably due to hypovolemia. No obvious source of infection and she is afebrile. We will give her IV fluids, and repeat lactic acid level in the morning.  #3 chronic atrial fibrillation on anticoagulation: She is also on calcium channel blockers and beta blockers. Due to low blood pressure we will hold her calcium channel blocker. Decrease the dose of metoprolol. Monitor her on telemetry. Continue with her anticoagulation. Recent cardiac catheterization over the summer is noted, which showed patent coronary arteries.  #4 chronic diarrhea: Etiology remains unclear. She follows with gastroenterology. Imodium as needed.  #5 abnormal weight loss: Probably due to poor oral intake. She has had colonoscopy and upper endoscopy which have been unremarkable as per her daughters. She is on Megace at this time.  #6 history of cognitive impairment/dementia: Stable. Continue to monitor.  #7. Diabetes mellitus, type II: Check HbA1c. Place on sliding scale coverage. Monitor CBGs. Due to reports of poor oral intake we will hold her long-acting insulin for now. This can be resumed depending on her blood sugars and HbA1c.   DVT Prophylaxis: She is on anticoagulation Code Status: Full code Family Communication: Discussed with the daughters and the patient  Disposition Plan: Observe to telemetry   Further management decisions will depend on results of further testing and patient's response to treatment.   ,  [facility] Pager [phone]  If 7PM-7AM, please contact night-coverage [email or web address] Password [email or web address]  04/11/2014, 3:32 AM  

## 2014-04-11 NOTE — Evaluation (Signed)
Occupational Therapy Evaluation Patient Details Name: [name] MRN: [id number] DOB: 02-15-1951 Today's Date: 04/11/2014    History of Present Illness [name] is a 63 y.o. female with a past medical history of chronic atrial fibrillation on anticoagulation, diabetes, diastolic congestive heart failure, hypothyroidism, recently diagnosed with cognitive impairment, seizure disorder, who presented to the hospital after having a syncopal episode at home. She was in the kitchen when she suddenly passed out. Her daughter was there and she witnessed the episode. Patient was unconscious for less than a minute. There was no seizure activity. Patient denies any chest pain, shortness of breath, or lightheadedness prior to this episode. Denies such symptoms currently. No nausea, vomiting, recently. She does have chronic diarrhea and has about 4-5 watery stools on a daily basis. She is followed by Dr. Darrick Penna for the same. Over the last 3 months the daughter reports that the patient has lost about 30-50 pounds. No clear reason has been found except for poor oral intake. And which is thought to be related to her cognitive impairment. About 2 months ago she was taken off of simvastatin for unclear reason and her dose of Reglan was reduced. Recently, she was taken off of the Reglan. She was also started on Cymbalta a few weeks ago. No changes to her cardiac medications recently. No fever. No chills.     Clinical Impression   Pt is presenting to acute OT with above situation.  She has been requiring increased assist with ADLs over the past couple months due to increasing weakness.  Pt has generalized weakness in BUE and fatigued easily during functional transfers (pt tolerated approximately 6 steps prior to requesting to sit).  Pt will benefit from continued OT services to improve overall strength and ADL engagement.  Recommend HHOT at this time.  Daughter was interested in outpatient therapy services, which would  also be appropriate for pt pending transportation concerns.  Pt will also benefit from [facility] and shower seat, to improve safety in the home.    Follow Up Recommendations  Home health OT (Outpatient OT could also be appropriate, pending transportation)    Equipment Recommendations  3 in 1 bedside comode;Tub/shower seat    Recommendations for Other Services       Precautions / Restrictions Precautions Precautions: Fall Restrictions Weight Bearing Restrictions: No      Mobility Bed Mobility Overal bed mobility: Needs Assistance Bed Mobility: Supine to Sit;Sit to Supine     Supine to sit: Supervision Sit to supine: Supervision      Transfers Overall transfer level: Needs assistance Equipment used: None Transfers: Sit to/from Stand Sit to Stand: Min guard              Balance                                            ADL Overall ADL's : Needs assistance/impaired Eating/Feeding: Set up   Grooming: Set up;Minimal assistance Grooming Details (indicate cue type and reason): assist for reaching to back of head             Lower Body Dressing: Min guard Lower Body Dressing Details (indicate cue type and reason): doff/don socks with supervision.  will require at least min guard assist for standing tasks in dressing Toilet Transfer: Min guard;BSC  Vision                     Perception     Praxis      Pertinent Vitals/Pain Pain Assessment: No/denies pain     Hand Dominance Right   Extremity/Trunk Assessment Upper Extremity Assessment Upper Extremity Assessment:  (Neuropathy in B hands at baseline.  limited shoudler ROM.  Daughter reports RUE weakness.)   Lower Extremity Assessment Lower Extremity Assessment: Defer to PT evaluation       Communication Communication Communication: No difficulties   Cognition Arousal/Alertness: Awake/alert Behavior During Therapy: WFL for tasks  assessed/performed Overall Cognitive Status: History of cognitive impairments - at baseline                     General Comments       Exercises       Shoulder Instructions      Home Living Family/patient expects to be discharged to:: Private residence Living Arrangements: Spouse/significant other;Children (daughter) Available Help at Discharge: Family Type of Home: House Home Access: Stairs to enter Secretary/administratorntrance Stairs-Number of Steps: 4 Entrance Stairs-Rails: Can reach both Home Layout: One level     Bathroom Shower/Tub: Producer, television/film/videoWalk-in shower   Bathroom Toilet: Standard     Home Equipment: Toilet riser;Grab bars - tub/shower;Walker - standard          Prior Functioning/Environment Level of Independence: Needs assistance  Gait / Transfers Assistance Needed: Pt amulbates independently or with standard walker, depending on the day ADL's / Homemaking Assistance Needed: pt reports taht she has been needing increased assist with ADLs - min-mod assist with dressing and meal prep. Bathes with independence.        OT Diagnosis: Generalized weakness   OT Problem List: Decreased strength;Decreased range of motion;Decreased activity tolerance   OT Treatment/Interventions: Self-care/ADL training;Therapeutic exercise;Energy conservation;DME and/or AE instruction;Therapeutic activities;Patient/family education    OT Goals(Current goals can be found in the care plan section) Acute Rehab OT Goals Patient Stated Goal: to regain arm strength OT Goal Formulation: With patient Time For Goal Achievement: 04/25/14 Potential to Achieve Goals: Good ADL Goals Pt Will Transfer to Toilet: with supervision Pt/caregiver will Perform Home Exercise Program: Increased ROM;Increased strength;Both right and left upper extremity  OT Frequency: Min 2X/week   Barriers to D/C:            Co-evaluation              End of Session Equipment Utilized During Treatment: Gait belt  Activity  Tolerance: Patient tolerated treatment well;Patient limited by fatigue Patient left: in bed;with family/visitor present;with call bell/phone within reach   Time: 1032-1056 OT Time Calculation (min): 24 min Charges:  OT General Charges $OT Visit: 1 Procedure OT Evaluation $Initial OT Evaluation Tier I: 1 Procedure G-Codes: OT G-codes **NOT FOR INPATIENT CLASS** Functional Assessment Tool Used: Clinical Judgement Functional Limitation: Self care Self Care Current Status (Z6109(G8987): At least 20 percent but less than 40 percent impaired, limited or restricted Self Care Goal Status (U0454(G8988): At least 1 percent but less than 20 percent impaired, limited or restricted   Tara BassetMarie Rawlings Chantell Kunkler, MS, OTR/L [facility] [phone] 04/11/2014, 11:08 AM

## 2014-04-11 NOTE — Care Management Note (Signed)
    Page 1 of 1   04/11/2014     4:29:53 PM CARE MANAGEMENT NOTE 04/11/2014  Patient:  LEEAN, VAQUEZ   Account Number:  [id number]  Date Initiated:  04/11/2014  Documentation initiated by:  Sharrie Rothman  Subjective/Objective Assessment:   Pt admitted from home with syncope. Pt lives with her husband and daughter. Pt has a walker for home use. Pt requires some assistance with ADL's.     Action/Plan:   PT and OT recommend home health Pt and OT. Pt choses [facility] for PT/OT. Weekend staff will call and fax orders once written at discharge.   Anticipated DC Date:  04/12/2014   Anticipated DC Plan:  HOME W HOME HEALTH SERVICES      DC Planning Services  CM consult      [facility] Choice  HOME HEALTH   Choice offered to / List presented to:  C-1 Patient        HH arranged  HH-2 PT  HH-3 OT      [facility] agency  [facility]   Status of service:  Completed, signed off Medicare Important Message given?   (If response is "NO", the following Medicare IM given date fields will be blank) Date Medicare IM given:   Medicare IM given by:   Date Additional Medicare IM given:   Additional Medicare IM given by:    Discharge Disposition:  HOME W HOME HEALTH SERVICES  Per UR Regulation:    If discussed at Long Length of Stay Meetings, dates discussed:    Comments:  04/11/14 1630 Arlyss Queen, RN BSNC M

## 2014-04-11 NOTE — Evaluation (Signed)
Physical Therapy Evaluation Patient Details Name: [name] MRN: [id number] DOB: Dec 10, 1950 Today's Date: 04/11/2014   History of Present Illness  Pt is a 63 year old femal admitted with syncope.  She has a history of atrial fibrillation, DM, CHF, cognitive impairment, seizure disorder.  Pt has had a decrease of appetite over the past several months with a decline in weight and has had concurrent c/o weakness..  She lives with her husband and daughter, normally ambulates with no assistive device.  Clinical Impression   Pt was seen for evaluation.  She was alert and oriented, very cooperative.  She was found to be deconditioned and gait is now more secure with the use of a walker.  Orthostatics were taken and recorded in doc flow.  She had no symptoms of orthostasis although her BP was fairly low (84/56 standing).  She would benefit from HHPT for generalized strengthening.    Follow Up Recommendations Home health PT    Equipment Recommendations  None recommended by PT    Recommendations for Other Services   OT    Precautions / Restrictions Precautions Precautions: Fall Restrictions Weight Bearing Restrictions: No      Mobility  Bed Mobility Overal bed mobility: Modified Independent                Transfers Overall transfer level: Needs assistance Equipment used: None Transfers: Sit to/from Stand Sit to Stand: Supervision            Ambulation/Gait Ambulation/Gait assistance: Supervision Ambulation Distance (Feet): 100 Feet Assistive device: Rolling walker (2 wheeled) Gait Pattern/deviations: WFL(Within Functional Limits)   Gait velocity interpretation: Below normal speed for age/gender General Gait Details: pt uses a walker in order to decrease the exertional level. and to increase her sense of stability  Stairs            Wheelchair Mobility    Modified Rankin (Stroke Patients Only)       Balance Overall balance assessment: No apparent  balance deficits (not formally assessed)                                           Pertinent Vitals/Pain Pain Assessment: No/denies pain    Home Living Family/patient expects to be discharged to:: Private residence Living Arrangements: Spouse/significant other;Children Available Help at Discharge: Family Type of Home: House Home Access: Stairs to enter Entrance Stairs-Rails: Can reach both Entrance Stairs-Number of Steps: 4 Home Layout: One level Home Equipment: Toilet riser;Grab bars - tub/shower;Walker - standard      Prior Function     Gait / Transfers Assistance Needed: Pt amulbates independently or with standard walker, depending on the day  ADL's / Homemaking Assistance Needed: pt reports taht she has been needing increased assist with ADLs - min-mod assist with dressing and meal prep. Bathes with independence.        Hand Dominance   Dominant Hand: Right    Extremity/Trunk Assessment               Lower Extremity Assessment: Generalized weakness      Cervical / Trunk Assessment: Kyphotic  Communication   Communication: No difficulties  Cognition Arousal/Alertness: Awake/alert Behavior During Therapy: WFL for tasks assessed/performed Overall Cognitive Status: History of cognitive impairments - at baseline  General Comments      Exercises        Assessment/Plan    PT Assessment All further PT needs can be met in the next venue of care  PT Diagnosis Difficulty walking;Generalized weakness   PT Problem List Decreased strength;Decreased activity tolerance;Decreased mobility  PT Treatment Interventions     PT Goals (Current goals can be found in the Care Plan section) Acute Rehab PT Goals PT Goal Formulation: No goals set, d/c therapy    Frequency     Barriers to discharge  none      Co-evaluation               End of Session Equipment Utilized During Treatment: Gait belt Activity  Tolerance: Patient tolerated treatment well Patient left: in chair;with call bell/phone within reach;with family/visitor present Nurse Communication: Mobility status    Functional Assessment Tool Used: clinical judgement Functional Limitation: Mobility: Walking and moving around Mobility: Walking and Moving Around Current Status (Z1460): At least 20 percent but less than 40 percent impaired, limited or restricted Mobility: Walking and Moving Around Goal Status [phone]): At least 20 percent but less than 40 percent impaired, limited or restricted Mobility: Walking and Moving Around Discharge Status [phone]): At least 20 percent but less than 40 percent impaired, limited or restricted    Time: 1523-1555 PT Time Calculation (min): 32 min   Charges:   PT Evaluation $Initial PT Evaluation Tier I: 1 Procedure     PT G Codes:   Functional Assessment Tool Used: clinical judgement Functional Limitation: Mobility: Walking and moving around    [place] 04/11/2014, 4:59 PM

## 2014-04-12 DIAGNOSIS — R55 Syncope and collapse: Secondary | ICD-10-CM | POA: Diagnosis not present

## 2014-04-12 DIAGNOSIS — F0390 Unspecified dementia without behavioral disturbance: Secondary | ICD-10-CM

## 2014-04-12 DIAGNOSIS — E872 Acidosis: Secondary | ICD-10-CM

## 2014-04-12 DIAGNOSIS — E038 Other specified hypothyroidism: Secondary | ICD-10-CM

## 2014-04-12 DIAGNOSIS — R634 Abnormal weight loss: Secondary | ICD-10-CM | POA: Diagnosis not present

## 2014-04-12 DIAGNOSIS — E1122 Type 2 diabetes mellitus with diabetic chronic kidney disease: Secondary | ICD-10-CM

## 2014-04-12 DIAGNOSIS — I951 Orthostatic hypotension: Secondary | ICD-10-CM

## 2014-04-12 DIAGNOSIS — N189 Chronic kidney disease, unspecified: Secondary | ICD-10-CM

## 2014-04-12 DIAGNOSIS — Z7901 Long term (current) use of anticoagulants: Secondary | ICD-10-CM | POA: Diagnosis not present

## 2014-04-12 DIAGNOSIS — I4891 Unspecified atrial fibrillation: Secondary | ICD-10-CM | POA: Diagnosis not present

## 2014-04-12 DIAGNOSIS — E43 Unspecified severe protein-calorie malnutrition: Secondary | ICD-10-CM

## 2014-04-12 DIAGNOSIS — D509 Iron deficiency anemia, unspecified: Secondary | ICD-10-CM

## 2014-04-12 LAB — GLUCOSE, CAPILLARY: Glucose-Capillary: 142 mg/dL — ABNORMAL HIGH (ref 70–99)

## 2014-04-12 MED ORDER — GLUCERNA SHAKE PO LIQD: 237.0000 mL | Freq: Three times a day (TID) | ORAL | Status: DC

## 2014-04-12 MED ORDER — METOPROLOL TARTRATE 25 MG PO TABS: 25.0000 mg | ORAL_TABLET | Freq: Two times a day (BID) | ORAL | Status: DC

## 2014-04-12 MED ORDER — LOPERAMIDE HCL 2 MG PO CAPS: 2.0000 mg | ORAL_CAPSULE | Freq: Three times a day (TID) | ORAL | Status: DC | PRN

## 2014-04-12 NOTE — Progress Notes (Signed)
Patient discharged with instructions, prescription, and care notes.  Patient and daughter verbalized understanding via teach back.  IV was removed and the site was WNL. Patient voiced no further complaints or concerns at the time of discharge.  Appointments scheduled per instructions.  Patient left the floor via w/c with staff and family in stable condition.Patient discharged with instructions, prescription, and care notes.  Verbalized understanding via teach back.  IV was removed and the site was WNL. Patient voiced no further complaints or concerns at the time of discharge.  Appointments scheduled per instructions.  Patient left the floor via w/c with staff and family in stable condition.

## 2014-04-12 NOTE — Discharge Instructions (Signed)
Syncope °Syncope is a medical term for fainting or passing out. This means you lose consciousness and drop to the ground. People are generally unconscious for less than 5 minutes. You may have some muscle twitches for up to 15 seconds before waking up and returning to normal. Syncope occurs more often in older adults, but it can happen to anyone. While most causes of syncope are not dangerous, syncope can be a sign of a serious medical problem. It is important to seek medical care.  °CAUSES  °Syncope is caused by a sudden drop in blood flow to the brain. The specific cause is often not determined. Factors that can bring on syncope include: °· Taking medicines that lower blood pressure. °· Sudden changes in posture, such as standing up quickly. °· Taking more medicine than prescribed. °· Standing in one place for too long. °· Seizure disorders. °· Dehydration and excessive exposure to heat. °· Low blood sugar (hypoglycemia). °· Straining to have a bowel movement. °· Heart disease, irregular heartbeat, or other circulatory problems. °· Fear, emotional distress, seeing blood, or severe pain. °SYMPTOMS  °Right before fainting, you may: °· Feel dizzy or light-headed. °· Feel nauseous. °· See all white or all black in your field of vision. °· Have cold, clammy skin. °DIAGNOSIS  °Your health care provider will ask about your symptoms, perform a physical exam, and perform an electrocardiogram (ECG) to record the electrical activity of your heart. Your health care provider may also perform other heart or blood tests to determine the cause of your syncope which may include: °· Transthoracic echocardiogram (TTE). During echocardiography, sound waves are used to evaluate how blood flows through your heart. °· Transesophageal echocardiogram (TEE). °· Cardiac monitoring. This allows your health care provider to monitor your heart rate and rhythm in real time. °· Holter monitor. This is a portable device that records your  heartbeat and can help diagnose heart arrhythmias. It allows your health care provider to track your heart activity for several days, if needed. °· Stress tests by exercise or by giving medicine that makes the heart beat faster. °TREATMENT  °In most cases, no treatment is needed. Depending on the cause of your syncope, your health care provider may recommend changing or stopping some of your medicines. °HOME CARE INSTRUCTIONS °· Have someone stay with you until you feel stable. °· Do not drive, use machinery, or play sports until your health care provider says it is okay. °· Keep all follow-up appointments as directed by your health care provider. °· Lie down right away if you start feeling like you might faint. Breathe deeply and steadily. Wait until all the symptoms have passed. °· Drink enough fluids to keep your urine clear or pale yellow. °· If you are taking blood pressure or heart medicine, get up slowly and take several minutes to sit and then stand. This can reduce dizziness. °SEEK IMMEDIATE MEDICAL CARE IF:  °· You have a severe headache. °· You have unusual pain in the chest, abdomen, or back. °· You are bleeding from your mouth or rectum, or you have black or tarry stool. °· You have an irregular or very fast heartbeat. °· You have pain with breathing. °· You have repeated fainting or seizure-like jerking during an episode. °· You faint when sitting or lying down. °· You have confusion. °· You have trouble walking. °· You have severe weakness. °· You have vision problems. °If you fainted, call your local emergency services (911 in [place] Do not drive   yourself to the hospital.  °MAKE SURE YOU: °· Understand these instructions. °· Will watch your condition. °· Will get help right away if you are not doing well or get worse. °Document Released: 06/20/2005 Document Revised: 06/25/2013 Document Reviewed: 08/19/2011 °[employer]® Patient Information ©2015 [employer]. This information is not intended to replace  advice given to you by your health care provider. Make sure you discuss any questions you have with your health care provider. ° °

## 2014-04-12 NOTE — Discharge Summary (Addendum)
Physician Discharge Summary  [name] VWU:[id number] DOB: Oct 02, 1950 DOA: 04/10/2014  PCP: Milana ObeyKNOWLTON,STEPHEN D, MD  Admit date: 04/10/2014 Discharge date: 04/12/2014  Time spent: 45 minutes  Recommendations for Outpatient Follow-up:  Patient will be discharged to home with home health PT and OT. Patient is followed by Dr. Purvis SheffieldKoneswaran within 1-2 weeks.  Patient should also follow up with her primary care physician within one week of discharge. Patient to continue taking her medications as prescribed. Patient should continue activity as tolerated. Patient may benefit from outpatient event monitor, this should be determined by her primary care physician. Patient should follow a carb modified diet.  Discharge Diagnoses:  Syncopal episode Elevated lactic acid Chronic atrial fibrillation on anticoagulation Chronic diarrhea Abnormal weight loss History of cognitive impairment/dementia Severe malnutrition  Discharge Condition: Stable  Diet recommendation: Carb modified  Filed Weights   04/11/14 0400 04/11/14 2057 04/12/14 0523  Weight: 74.8 kg (164 lb 14.5 oz) 77.8 kg (171 lb 8.3 oz) 78.1 kg (172 lb 2.9 oz)    History of present illness:  on 04/11/2014  Tara KrebsMona H [name] is a 63 y.o. female with a past medical history of chronic atrial fibrillation on anticoagulation, diabetes, diastolic congestive heart failure, hypothyroidism, recently diagnosed with cognitive impairment, seizure disorder, who presented to the hospital after having a syncopal episode at home. She was in the kitchen when she suddenly passed out. Her daughter was there and she witnessed the episode. Patient was unconscious for less than a minute. There was no seizure activity. Patient denied any chest pain, shortness of breath, or lightheadedness prior to this episode. Denied such symptoms currently. No nausea, vomiting, recently. She does have chronic diarrhea and has about 4-5 watery stools on a daily basis. She is followed by  Dr. Darrick PennaFields for the same. Over the last 3 months the daughter reports that the patient has lost about 30-50 pounds. No clear reason has been found except for poor oral intake. And which is thought to be related to her cognitive impairment. About 2 months ago she was taken off of simvastatin for unclear reason and her dose of Reglan was reduced. Recently, she was taken off of the Reglan. She was also started on Cymbalta a few weeks ago. No changes to her cardiac medications recently. No fever. No chills.  Hospital Course:  Syncopal episode  -Possibly vasovagal due to dehydration from poor oral intake versus diarrhea  -CT of the head: No acute intracranial pathology  -Chest x-ray and urinary analysis did not suggest infection  -Echocardiogram in June 2015 showed an EF of 50-55%, unable to evaluate LV dysfunction due to atrial fibrillation  -Repeat echocardiogram was not recommended by cardiology  -Cardiology recommended possibly doing an outpatient event monitor. -PT and OT recommended home health therapy with bedside commode and shower seat.  Elevated lactic Acid  -Resolved, Possibly secondary to hypovolemia  -No infectious etiology at this time, no leukocytosis, patient is afebrile   Chronic atrial fibrillation on anticoagulation  -Continue Eliquis  -Continue metoprolol, however there was reduced  -Diltiazem discontinued  Chronic diarrhea  -Continue Imodium as needed  -Etiology unclear, patient is followed by gastroenterology   Abnormal weight loss/ severe malnutrition  -Possibly secondary to poor oral intake  -Patient has had endoscopy as well as colonoscopy in the past which have been unremarkable  -Nutrition consulted -Continue Marinol and feeding supplementation  History cognitive impairment/dementia  -Currently stable   Diabetes mellitus, type II  -Hemoglobin A1c 6.9  -Continue home regimen  Hypothyroidism  -  Continue Synthroid  -Free T4 1.44  Chronic diastolic heart  failure  -Patient appears to be dehydrated, Lasix currently held  Procedures: None  Consultations: Cardiology  Discharge Exam: Filed Vitals:   04/12/14 0523  BP: 140/84  Pulse: 88  Temp: 97.8 F (36.6 C)  Resp: 16     General: Well developed, well nourished, NAD, appears stated age  HEENT: NCAT, mucous membranes moist.  Cardiovascular: S1 S2 auscultated, no rubs, murmurs or gallops. Irregular  Respiratory: Clear to auscultation bilaterally with equal chest rise  Abdomen: Soft, nontender, nondistended, + bowel sounds  Extremities: warm dry without cyanosis clubbing or edema  Neuro: AAOx3, no focal deficits  Psych: Normal affect and demeanor   Discharge Instructions      Discharge Instructions   Discharge instructions    Complete by:  As directed   Patient will be discharged to home. Patient is followed by Dr. Purvis Sheffield within 1-2 weeks.  Patient should also follow up with her primary care physician within one week of discharge. Patient to continue taking her medications as prescribed. Patient should continue activity as tolerated. Patient may benefit from outpatient event monitor, this should be determined by her primary care physician. Patient should follow a carb modified diet.            Medication List    STOP taking these medications       diltiazem 240 MG 24 hr capsule  Commonly known as:  CARDIZEM CD     DULoxetine 60 MG capsule  Commonly known as:  CYMBALTA     furosemide 40 MG tablet  Commonly known as:  LASIX     potassium chloride SA 20 MEQ tablet  Commonly known as:  K-DUR,KLOR-CON      TAKE these medications       apixaban 5 MG Tabs tablet  Commonly known as:  ELIQUIS  Take 1 tablet (5 mg total) by mouth 2 (two) times daily.     dronabinol 2.5 MG capsule  Commonly known as:  MARINOL  Take 2.5 mg by mouth 2 (two) times daily before a meal.     feeding supplement (GLUCERNA SHAKE) Liqd  Take 237 mLs by mouth 3 (three) times daily  between meals.     glipiZIDE 10 MG tablet  Commonly known as:  GLUCOTROL  Take 10 mg by mouth daily before breakfast.     HYDROcodone-acetaminophen 5-325 MG per tablet  Commonly known as:  NORCO/VICODIN  Take 2 tablets by mouth every 6 (six) hours as needed for moderate pain.     LANTUS SOLOSTAR 100 UNIT/ML Solostar Pen  Generic drug:  Insulin Glargine  Inject 20-50 Units into the skin See admin instructions. Patient takes 20 units every night at bedtime.  She takes 50 units if her blood sugar is above 300.     levETIRAcetam 250 MG tablet  Commonly known as:  KEPPRA  Take 1 tablet (250 mg total) by mouth 2 (two) times daily.     levothyroxine 125 MCG tablet  Commonly known as:  SYNTHROID, LEVOTHROID  Take 125 mcg by mouth daily.     loperamide 2 MG capsule  Commonly known as:  IMODIUM  Take 1 capsule (2 mg total) by mouth 3 (three) times daily as needed for diarrhea or loose stools.     LORazepam 1 MG tablet  Commonly known as:  ATIVAN  Take 1-2 mg by mouth daily as needed for anxiety or sleep. For anxiety     metaxalone 800  MG tablet  Commonly known as:  SKELAXIN  Take 800 mg by mouth 3 (three) times daily.     metFORMIN 500 MG tablet  Commonly known as:  GLUCOPHAGE  Take 1,000 mg by mouth 2 (two) times daily with a meal.     metoprolol tartrate 25 MG tablet  Commonly known as:  LOPRESSOR  Take 1 tablet (25 mg total) by mouth 2 (two) times daily.     nitroGLYCERIN 0.4 MG SL tablet  Commonly known as:  NITROSTAT  - Place 0.4 mg under the tongue every 5 (five) minutes as needed for chest pain. For chest pain  -      omeprazole 20 MG capsule  Commonly known as:  PRILOSEC  Take 20 mg by mouth 2 (two) times daily.     ondansetron 4 MG tablet  Commonly known as:  ZOFRAN  Take 4 mg by mouth every 6 (six) hours as needed. For nausea       Allergies  Allergen Reactions  . Ibuprofen     Upsets her stomach and causes abdominal pain  . Celexa [Citalopram  Hydrobromide]   . Codeine Nausea And Vomiting    HALLUCINATIONS   Follow-up Information   Follow up with [facility].   Specialty:  Home Health Services   Contact information:   [address] [place] [address] [phone]       Follow up with [name], MD. Schedule an appointment as soon as possible for a visit in 1 week. [facility] discharge)    Specialty:  Family Medicine   Contact information:   [address] [place] [address] [phone]       Follow up with Laqueta Linden, MD. Schedule an appointment as soon as possible for a visit in 2 weeks. [facility] followup)    Specialty:  Cardiology   Contact information:   [address] [place] [address] [phone]        The results of significant diagnostics from this hospitalization (including imaging, microbiology, ancillary and laboratory) are listed below for reference.    Significant Diagnostic Studies: Dg Chest 2 View  04/11/2014   CLINICAL DATA:  Acute onset of dizziness and hypotension. Syncope earlier tonight. Initial encounter.  EXAM: CHEST  2 VIEW  COMPARISON:  Chest radiograph performed 04/04/2014  FINDINGS: The lungs are well-aerated. Minimal bibasilar opacities likely reflect atelectasis. There is no evidence of pleural effusion or pneumothorax.  The heart is mildly enlarged. No acute osseous abnormalities are seen.  IMPRESSION: Minimal bibasilar opacities likely reflect atelectasis; lungs otherwise clear. Mild cardiomegaly.   Electronically Signed   By: Roanna Raider M.D.   On: 04/11/2014 04:20   Dg Chest 2 View  04/05/2014   CLINICAL DATA:  Patient complains of nausea, weakness, shortness of breath. History of atrial fibrillation and congestive heart failure.  EXAM: CHEST  2 VIEW  COMPARISON:  Chest radiograph 01/07/2014  FINDINGS: Stable cardiomegaly. Elevation of the left hemidiaphragm. Minimal heterogeneous opacities left lung base. No large  consolidative pulmonary opacity. No pleural effusion or pneumothorax. Lateral view is limited due to multiple overlying leads and soft tissue.  IMPRESSION: Cardiomegaly. Elevation left hemidiaphragm. Heterogeneous left lung base opacities likely represent combination of scarring and or atelectasis.   Electronically Signed   By: Annia Belt M.D.   On: 04/05/2014 00:36   Ct Head Wo Contrast  04/11/2014   CLINICAL DATA:  Acute onset of dizziness and hypotension. Syncope earlier  today. Initial encounter.  EXAM: CT HEAD WITHOUT CONTRAST  TECHNIQUE: Contiguous axial images were obtained from the base of the skull through the vertex without intravenous contrast.  COMPARISON:  CT of the head from 10/04/2012, and MRI/MRA of the brain performed 11/08/2012  FINDINGS: There is no evidence of acute infarction, mass lesion, or intra- or extra-axial hemorrhage on CT.  Prominence of the ventricles and sulci reflects mild cortical volume loss. Mild cerebellar atrophy is noted. Mild periventricular white matter change likely reflects small vessel ischemic microangiopathy.  The brainstem and fourth ventricle are within normal limits. The basal ganglia are unremarkable in appearance. The cerebral hemispheres demonstrate grossly normal gray-white differentiation. No mass effect or midline shift is seen.  There is no evidence of fracture; visualized osseous structures are unremarkable in appearance. The orbits are within normal limits. There is opacification of the right side of the sphenoid sinus. The remaining paranasal sinuses and mastoid air cells are well-aerated. No significant soft tissue abnormalities are seen.  IMPRESSION: 1. No acute intracranial pathology seen on CT. 2. Mild cortical volume loss and scattered small vessel ischemic microangiopathy. 3. Opacification of the right side of the sphenoid sinus.   Electronically Signed   By: Roanna Raider M.D.   On: 04/11/2014 04:19    Microbiology: Recent Results (from the  past 240 hour(s))  MRSA PCR SCREENING     Status: None   Collection Time    04/11/14  3:00 AM      Result Value Ref Range Status   MRSA by PCR NEGATIVE  NEGATIVE Final   Comment:            The GeneXpert MRSA Assay (FDA     approved for NASAL specimens     only), is one component of a     comprehensive MRSA colonization     surveillance program. It is not     intended to diagnose MRSA     infection nor to guide or     monitor treatment for     MRSA infections.     Labs: Basic Metabolic Panel:  Recent Labs Lab 04/10/14 2333 04/11/14 0810  NA 140 140  K 3.6* 3.8  CL 103 105  CO2 23 24  GLUCOSE 175* 135*  BUN 19 19  CREATININE 0.73 0.65  CALCIUM 8.4 8.3*   Liver Function Tests:  Recent Labs Lab 04/11/14 0810  AST 11  ALT 9  ALKPHOS 49  BILITOT 0.3  PROT 6.3  ALBUMIN 3.0*   No results found for this basename: LIPASE, AMYLASE,  in the last 168 hours No results found for this basename: AMMONIA,  in the last 168 hours CBC:  Recent Labs Lab 04/10/14 2333 04/11/14 0810  WBC 8.9 8.7  NEUTROABS 5.6  --   HGB 12.0 11.3*  HCT 37.0 35.6*  MCV 87.7 87.3  PLT 216 220   Cardiac Enzymes:  Recent Labs Lab 04/10/14 2333 04/11/14 0117 04/11/14 0810 04/11/14 1347 04/11/14 1936  TROPONINI <0.30 <0.30 <0.30 <0.30 <0.30   BNP: BNP (last 3 results)  Recent Labs  01/03/14 2253 01/07/14 2029 04/04/14 2327  PROBNP 1015.0* 1495.0* 1689.0*   CBG:  Recent Labs Lab 04/11/14 0802 04/11/14 1245 04/11/14 1658 04/11/14 2121 04/12/14 0820  GLUCAP 133* 151* 171* 153* 142*       Signed:  Timiyah Romito  [facility] 04/12/2014, 10:01 AM

## 2014-04-13 NOTE — Progress Notes (Signed)
REVIEWED.  

## 2014-04-13 NOTE — Telephone Encounter (Signed)
PLEASE CALL PT. SHE NEEDS TO BE SEEN BY [facility] FOR UNINTENTIONAL WEIGHT LOSS, DIARRHEA, ABDOMINAL PAIN, GASTROPARESIS. FOLLOW A DAIRY FREE/LOW FAT DIET. OPV IN DEC 2015 W/ [facility] E30 WEIGHT LOSS,GASTROPARESIS

## 2014-04-14 ENCOUNTER — Encounter: Payer: Self-pay | Admitting: Gastroenterology

## 2014-04-14 DIAGNOSIS — K3184 Gastroparesis: Secondary | ICD-10-CM | POA: Diagnosis not present

## 2014-04-14 DIAGNOSIS — K2270 Barrett's esophagus without dysplasia: Secondary | ICD-10-CM | POA: Diagnosis not present

## 2014-04-14 NOTE — Telephone Encounter (Signed)
APPT MADE AND LETTER SENT  °

## 2014-04-15 NOTE — Telephone Encounter (Signed)
Referral has been made.

## 2014-04-15 NOTE — Telephone Encounter (Signed)
Called and informed pt's daughter, [name]. Ok to refer to [facility].

## 2014-04-16 NOTE — Progress Notes (Signed)
See plan outlined in telephone note on 04/04/2014.

## 2014-04-17 ENCOUNTER — Other Ambulatory Visit: Payer: Self-pay

## 2014-04-17 DIAGNOSIS — R634 Abnormal weight loss: Secondary | ICD-10-CM

## 2014-04-17 DIAGNOSIS — R197 Diarrhea, unspecified: Secondary | ICD-10-CM

## 2014-04-18 DIAGNOSIS — K2270 Barrett's esophagus without dysplasia: Secondary | ICD-10-CM | POA: Diagnosis not present

## 2014-04-18 DIAGNOSIS — K3184 Gastroparesis: Secondary | ICD-10-CM | POA: Diagnosis not present

## 2014-04-21 DIAGNOSIS — G40309 Generalized idiopathic epilepsy and epileptic syndromes, not intractable, without status epilepticus: Secondary | ICD-10-CM | POA: Diagnosis not present

## 2014-04-21 DIAGNOSIS — G259 Extrapyramidal and movement disorder, unspecified: Secondary | ICD-10-CM | POA: Diagnosis not present

## 2014-04-21 DIAGNOSIS — G9341 Metabolic encephalopathy: Secondary | ICD-10-CM | POA: Diagnosis not present

## 2014-04-22 DIAGNOSIS — K3184 Gastroparesis: Secondary | ICD-10-CM | POA: Diagnosis not present

## 2014-04-22 DIAGNOSIS — K2270 Barrett's esophagus without dysplasia: Secondary | ICD-10-CM | POA: Diagnosis not present

## 2014-04-24 DIAGNOSIS — K3184 Gastroparesis: Secondary | ICD-10-CM | POA: Diagnosis not present

## 2014-04-24 DIAGNOSIS — K2270 Barrett's esophagus without dysplasia: Secondary | ICD-10-CM | POA: Diagnosis not present

## 2014-04-25 DIAGNOSIS — K3184 Gastroparesis: Secondary | ICD-10-CM | POA: Diagnosis not present

## 2014-04-25 DIAGNOSIS — K2270 Barrett's esophagus without dysplasia: Secondary | ICD-10-CM | POA: Diagnosis not present

## 2014-04-28 DIAGNOSIS — R634 Abnormal weight loss: Secondary | ICD-10-CM | POA: Diagnosis not present

## 2014-04-28 DIAGNOSIS — I482 Chronic atrial fibrillation: Secondary | ICD-10-CM | POA: Diagnosis not present

## 2014-04-28 DIAGNOSIS — K2270 Barrett's esophagus without dysplasia: Secondary | ICD-10-CM | POA: Diagnosis not present

## 2014-04-28 DIAGNOSIS — K3184 Gastroparesis: Secondary | ICD-10-CM | POA: Diagnosis not present

## 2014-04-28 DIAGNOSIS — E1165 Type 2 diabetes mellitus with hyperglycemia: Secondary | ICD-10-CM | POA: Diagnosis not present

## 2014-04-29 DIAGNOSIS — K3184 Gastroparesis: Secondary | ICD-10-CM | POA: Diagnosis not present

## 2014-04-29 DIAGNOSIS — K2270 Barrett's esophagus without dysplasia: Secondary | ICD-10-CM | POA: Diagnosis not present

## 2014-05-05 ENCOUNTER — Encounter (HOSPITAL_COMMUNITY): Payer: Self-pay | Admitting: Internal Medicine

## 2014-05-06 DIAGNOSIS — I482 Chronic atrial fibrillation: Secondary | ICD-10-CM | POA: Diagnosis not present

## 2014-05-06 DIAGNOSIS — K3184 Gastroparesis: Secondary | ICD-10-CM | POA: Diagnosis not present

## 2014-05-06 DIAGNOSIS — R634 Abnormal weight loss: Secondary | ICD-10-CM | POA: Diagnosis not present

## 2014-05-06 DIAGNOSIS — K2270 Barrett's esophagus without dysplasia: Secondary | ICD-10-CM | POA: Diagnosis not present

## 2014-05-06 DIAGNOSIS — E1165 Type 2 diabetes mellitus with hyperglycemia: Secondary | ICD-10-CM | POA: Diagnosis not present

## 2014-05-08 ENCOUNTER — Telehealth: Payer: Self-pay | Admitting: Gastroenterology

## 2014-05-08 DIAGNOSIS — K3184 Gastroparesis: Secondary | ICD-10-CM | POA: Diagnosis not present

## 2014-05-08 DIAGNOSIS — K2270 Barrett's esophagus without dysplasia: Secondary | ICD-10-CM | POA: Diagnosis not present

## 2014-05-08 NOTE — Telephone Encounter (Signed)
Patient's daughter called to see if SF could recommend something else less expensive for patient. She said that the "draunable" (?) wasn't covered by her insurance and she needs something cheaper. Also, she said that the patient was doing much better, she's eating and has gained 20 pounds the past month. Daughter also said that she didn't feel the need of keeping the appointment with the specialist at [facility] and was going to cancel that visit. Please advise if there's something else that we can call into [place]

## 2014-05-12 NOTE — Telephone Encounter (Signed)
Daughter is aware and will call if needed.

## 2014-05-12 NOTE — Telephone Encounter (Signed)
There really is not a substitute for marinol. If she does not want to get this filled, we can see what happens off the medication.  1. Continue Zofran before meals three times daily. 2. Continue Zenpep. 3. No reglan due to tardive dyskinesia.  4. I would have liked her to follow up at [facility] but if they feel she is better we can wait on this. Make sure they cancel and don't No Show.  5. Keep OV with [facility] in 06/2014. Call in interim if problems.

## 2014-05-12 NOTE — Telephone Encounter (Signed)
I called and spoke to the pt's daughter, [name]. She said she has gotten the Marinol for this month and it is $116.00 and she cannot afford that every month. Her mom is doing much better, but please advise if she can get a cheaper medication in place of the Marinol.

## 2014-05-13 DIAGNOSIS — K3184 Gastroparesis: Secondary | ICD-10-CM | POA: Diagnosis not present

## 2014-05-13 DIAGNOSIS — K2270 Barrett's esophagus without dysplasia: Secondary | ICD-10-CM | POA: Diagnosis not present

## 2014-05-14 DIAGNOSIS — K2270 Barrett's esophagus without dysplasia: Secondary | ICD-10-CM | POA: Diagnosis not present

## 2014-05-14 DIAGNOSIS — K3184 Gastroparesis: Secondary | ICD-10-CM | POA: Diagnosis not present

## 2014-06-04 ENCOUNTER — Ambulatory Visit (INDEPENDENT_AMBULATORY_CARE_PROVIDER_SITE_OTHER): Payer: Medicare Other | Admitting: Gastroenterology

## 2014-06-04 ENCOUNTER — Encounter: Payer: Self-pay | Admitting: Gastroenterology

## 2014-06-04 VITALS — BP 120/78 | HR 88 | Temp 98.9°F | Ht 66.0 in | Wt 211.2 lb

## 2014-06-04 DIAGNOSIS — K3184 Gastroparesis: Secondary | ICD-10-CM | POA: Diagnosis not present

## 2014-06-04 DIAGNOSIS — K2270 Barrett's esophagus without dysplasia: Secondary | ICD-10-CM | POA: Diagnosis not present

## 2014-06-04 DIAGNOSIS — K529 Noninfective gastroenteritis and colitis, unspecified: Secondary | ICD-10-CM

## 2014-06-04 MED ORDER — DRONABINOL 2.5 MG PO CAPS: 2.5000 mg | ORAL_CAPSULE | Freq: Two times a day (BID) | ORAL | Status: DC

## 2014-06-04 MED ORDER — LOPERAMIDE HCL 2 MG PO CAPS: 2.0000 mg | ORAL_CAPSULE | Freq: Three times a day (TID) | ORAL | Status: DC | PRN

## 2014-06-04 NOTE — Progress Notes (Signed)
cc'ed to pcp °

## 2014-06-04 NOTE — Assessment & Plan Note (Signed)
REPEAT EGD IN 2016

## 2014-06-04 NOTE — Progress Notes (Signed)
ON RECALL LIST  °

## 2014-06-04 NOTE — Assessment & Plan Note (Signed)
UNABLE TO TOLERATE REGLAN.  MARINOL FOR NAUSEA ZOFRAN AS NEEDED FOLLOW UP IN 4 MOS.

## 2014-06-04 NOTE — Assessment & Plan Note (Signed)
likely due to diabetic colopathy and Less likely IBS OR BACTERIAL OVERGROWTH.  IMODIUM PRN AVOID DAIRY FOLLOW UP IN 4 MOS.

## 2014-06-04 NOTE — Progress Notes (Signed)
Subjective:    Patient ID: [name], female    DOB: 1950/11/17, 63 y.o.   MRN: [id number]  Tara Obey, MD  HPI Eating better after loss of appetite and weight loss over a 3 mos period of time DUE TO NAUSEA. WAS TAKING MEDICINES AND NOT EATING. FELT BETTER AFTER BEING HOSPITALIZED.  Diarrhea: 3/day. Weight up from 169 lbs. DAUGHTER FELT NAUSEA MADE WORSE BY TAKING MEDS W/O FOOD. NAUSEA: BETTER-TAKING ZOFRAN 1-2X/WEEK. VOMITING: LAST TIME 3 WEEKS AGO. CHANGED TO MARINOL AND IT'S MORE EXPENSIVE. TAKEN OFF REGLAN DUE TO DYSKINESIA.  WHEN SHE IS OFF MARINOL AND GOT SICK THAT WEEK. HAD A NL FORMED STOOL THE OTHER DAY. USUALLY LIKE SLUDGE. RARE ABD PAIN IN THE LOWER MIDDLE PART AROUND THE BELT LINE. MAY GLUCERNA PRN.   PT DENIES FEVER, CHILLS, HEMATOCHEZIA, HEMATEMESIS, melena, CHEST PAIN, SHORTNESS OF BREATH,  constipation, problems swallowing, OR heartburn or indigestion.  Past Medical History  Diagnosis Date  . Chest pain     Negative cardiac catheterization in 2002; negative stress nuclear study in 2008  . Congestive heart failure     Normal LV systolic function  . PAF (paroxysmal atrial fibrillation) 2009  . Chronic anticoagulation   . Diabetes mellitus, type 2     Insulin therapy; exacerbated by prednisone  . Syncope     Admitted 05/2009; magnetic resonance imagin/ MRA - negative; etiology thought to be orthostasis secondary to drugs and dehydration  . GERD (gastroesophageal reflux disease)   . IBS (irritable bowel syndrome)   . Hiatal hernia   . Gastroparesis     99% retention 05/2008 on GES  . Hyperlipidemia   . Hypertension   . Hypothyroid   . Chronic LBP     Surgical intervention in 1996  . Obesity   . OSA on CPAP   . Anemia     H/H of 10/30 with a normal MCV in 12/09  . Barrett's esophagus     Diagnosed 1995. Last EGD 2008.   . Seizures    Past Surgical History  Procedure Laterality Date  . Cardiovascular stress test  2008    Stress nuclear study  .  Back surgery  1996  . Laparoscopic cholecystectomy  1990s  . Total abdominal hysterectomy  1999  . Laminectomy  1995    L4-L5  . Carpal tunnel release  1994  . Esophagogastroduodenoscopy  2008    Barrett's without dysplasia. esphagus dilated. antral erosions, h.pylori serologies negative.  . Colonoscopy  11/2011    Dr. Darrick Penna: Internal hemorrhoids, mild diverticulosis. Random colon biopsies negative.  . Givens capsule study  12/07/2011    Proximal small bowel, rare AVM. Distal small bowel, multiple ulcers noted  . Esophagogastroduodenoscopy  11/2011    Dr. Darrick Penna: Barrett's esophagus, mild gastritis, diverticulum in the second portion of the duodenum repeat EGD 3 years. Small bowel biopsies negative. Gastric biopsy show reactive gastropathy but no H. pylori. Esophageal biopsies consistent with GERD. Next EGD 11/2014  . Givens capsule study N/A 09/27/2013    Distal small bowel ulcers extending to TI.  Marland Kitchen Givens capsule study N/A 10/10/2013    Procedure: GIVENS CAPSULE STUDY;  Surgeon: West Bali, MD;  Location: [facility];  Service: Endoscopy;  Laterality: N/A;  7:30  . Colonoscopy N/A 11/08/2013    [facility]: Normal mucosa in the terminal ileum/The colon IS redundant/  Moderate diverticulosis throughout the entire colon. ileum bx benign. colon bx benign  . Esophageal biopsy N/A 11/08/2013  Procedure: BIOPSY  / Tissue sampling / ulcers present in small intestine;  Surgeon: West BaliSandi L Kyrollos Cordell, MD;  Location: [facility];  Service: Endoscopy;  Laterality: N/A;  . Cardiac catheterization  2002   Allergies  Allergen Reactions  . Ibuprofen     Upsets her stomach and causes abdominal pain  . Celexa [Citalopram Hydrobromide]   . Codeine Nausea And Vomiting    HALLUCINATIONS    Current Outpatient Prescriptions  Medication Sig Dispense Refill  . apixaban (ELIQUIS) 5 MG TABS tablet Take 1 tablet (5 mg total) by mouth 2 (two) times daily. 60 tablet 6  . dronabinol (MARINOL) 2.5 MG capsule Take 2.5 mg by  mouth 2 (two) times daily before a meal.    . DULoxetine (CYMBALTA) 30 MG capsule Take 30 mg by mouth 2 (two) times daily.    Marland Kitchen. glipiZIDE (GLUCOTROL) 10 MG tablet Take 10 mg by mouth daily before breakfast.     . Insulin Glargine (LANTUS SOLOSTAR) 100 UNIT/ML Solostar Pen Inject 20-50 Units into the skin See admin instructions. Patient takes 20 units every night at bedtime.  She takes 50 units if her blood sugar is above 300.    . levETIRAcetam (KEPPRA) 250 MG tablet Take 1 tablet (250 mg total) by mouth 2 (two) times daily.    Marland Kitchen. levothyroxine (SYNTHROID, LEVOTHROID) 125 MCG tablet Take 125 mcg by mouth daily.    Marland Kitchen. loperamide (IMODIUM) 2 MG capsule Take 1 capsule (2 mg total) by mouth 3 (three) times daily as needed for diarrhea or loose stools. USING ONE A DAY   . LORazepam (ATIVAN) 1 MG tablet Take 1-2 mg by mouth daily as needed for anxiety or sleep. For anxiety    . metaxalone (SKELAXIN) 800 MG tablet Take 800 mg by mouth 3 (three) times daily.     . metFORMIN (GLUCOPHAGE) 500 MG tablet Take 1,000 mg by mouth 2 (two) times daily with a meal.     . metoprolol tartrate (LOPRESSOR) 25 MG tablet Take 1 tablet (25 mg total) by mouth 2 (two) times daily.    Marland Kitchen. omeprazole (PRILOSEC) 20 MG capsule Take 20 mg by mouth 2 (two) times daily.      . ondansetron (ZOFRAN) 4 MG tablet Take 4 mg by mouth every 6 (six) hours as needed. For nausea    . feeding supplement, GLUCERNA SHAKE, (GLUCERNA SHAKE) LIQD Take 237 mLs by mouth 3 (three) times daily between meals. (Patient not taking: Reported on 06/04/2014)    . HYDROcodone-acetaminophen (NORCO/VICODIN) 5-325 MG per tablet Take 2 tablets by mouth every 6 (six) hours as needed for moderate pain.    . nitroGLYCERIN (NITROSTAT) 0.4 MG SL tablet Place 0.4 mg under the tongue every 5 (five) minutes as needed for chest pain. For chest pain        Review of Systems     Objective:   Physical Exam  Constitutional: She is oriented to person, place, and time. She  appears well-developed and well-nourished. No distress.  HENT:  Head: Normocephalic and atraumatic.  Mouth/Throat: Oropharynx is clear and moist. No oropharyngeal exudate.  Eyes: Pupils are equal, round, and reactive to light. No scleral icterus.  Neck: Normal range of motion. Neck supple.  Cardiovascular: Normal rate, regular rhythm and normal heart sounds.   Pulmonary/Chest: Effort normal and breath sounds normal. No respiratory distress.  Abdominal: Soft. Bowel sounds are normal. She exhibits no distension. There is no tenderness.  Musculoskeletal: She exhibits no edema.  Lymphadenopathy:  She has no cervical adenopathy.  Neurological: She is alert and oriented to person, place, and time.  Moves mouth involuntarily otherwise NO  NEW FOCAL DEFICITS   Psychiatric: She has a normal mood and affect.  Vitals reviewed.         Assessment & Plan:

## 2014-06-04 NOTE — Patient Instructions (Signed)
USE MARINOL 1-2 TIMES A DAY TO PREVENT NAUSEA. I HAVE GIVEN YOU PRESCRIPTIONS BECAUSE MEDS CANNOT BE REFILLED ELECTRONICALLY.  CONTINUE TO USE ZOFRAN AS NEEDED.  USE IMODIUM AS NEEDED. I SENT REFILLS FOR NEXT 6 MOS.  FOLLOW UP IN 4 MOS.

## 2014-06-06 DIAGNOSIS — R569 Unspecified convulsions: Secondary | ICD-10-CM | POA: Diagnosis not present

## 2014-06-06 IMAGING — CR DG KNEE COMPLETE 4+V*R*
4 series · 4 of 4 positions shown · non-contrast
Comparison: 02/08/2006

CLINICAL DATA: Right knee pain

RIGHT KNEE - COMPLETE 4+ VIEW

[view not recorded (1 of 4)]
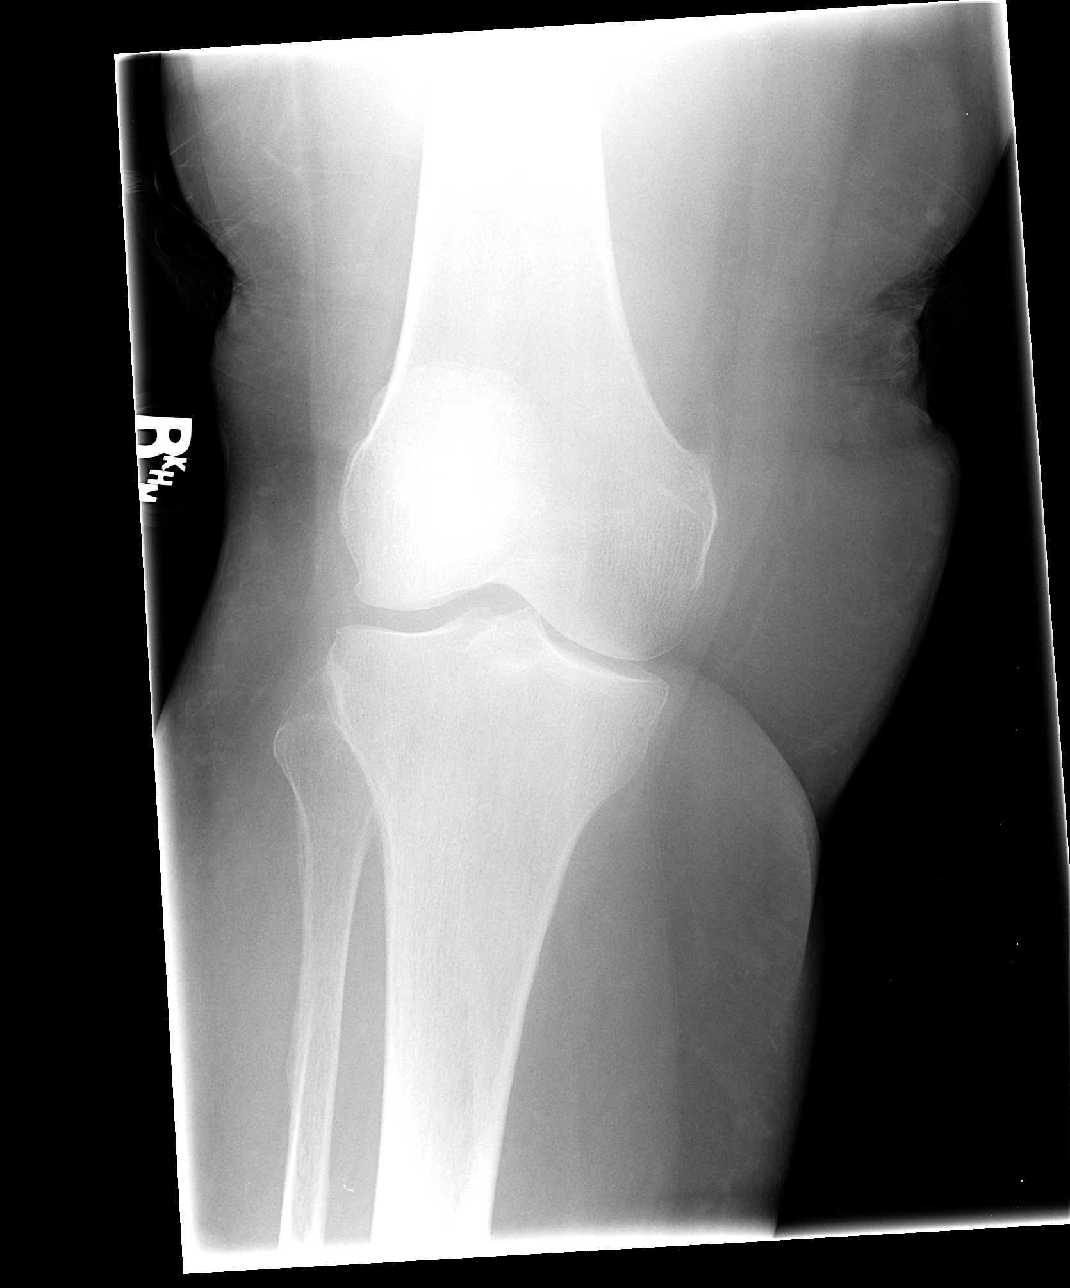

[view not recorded (2 of 4)]
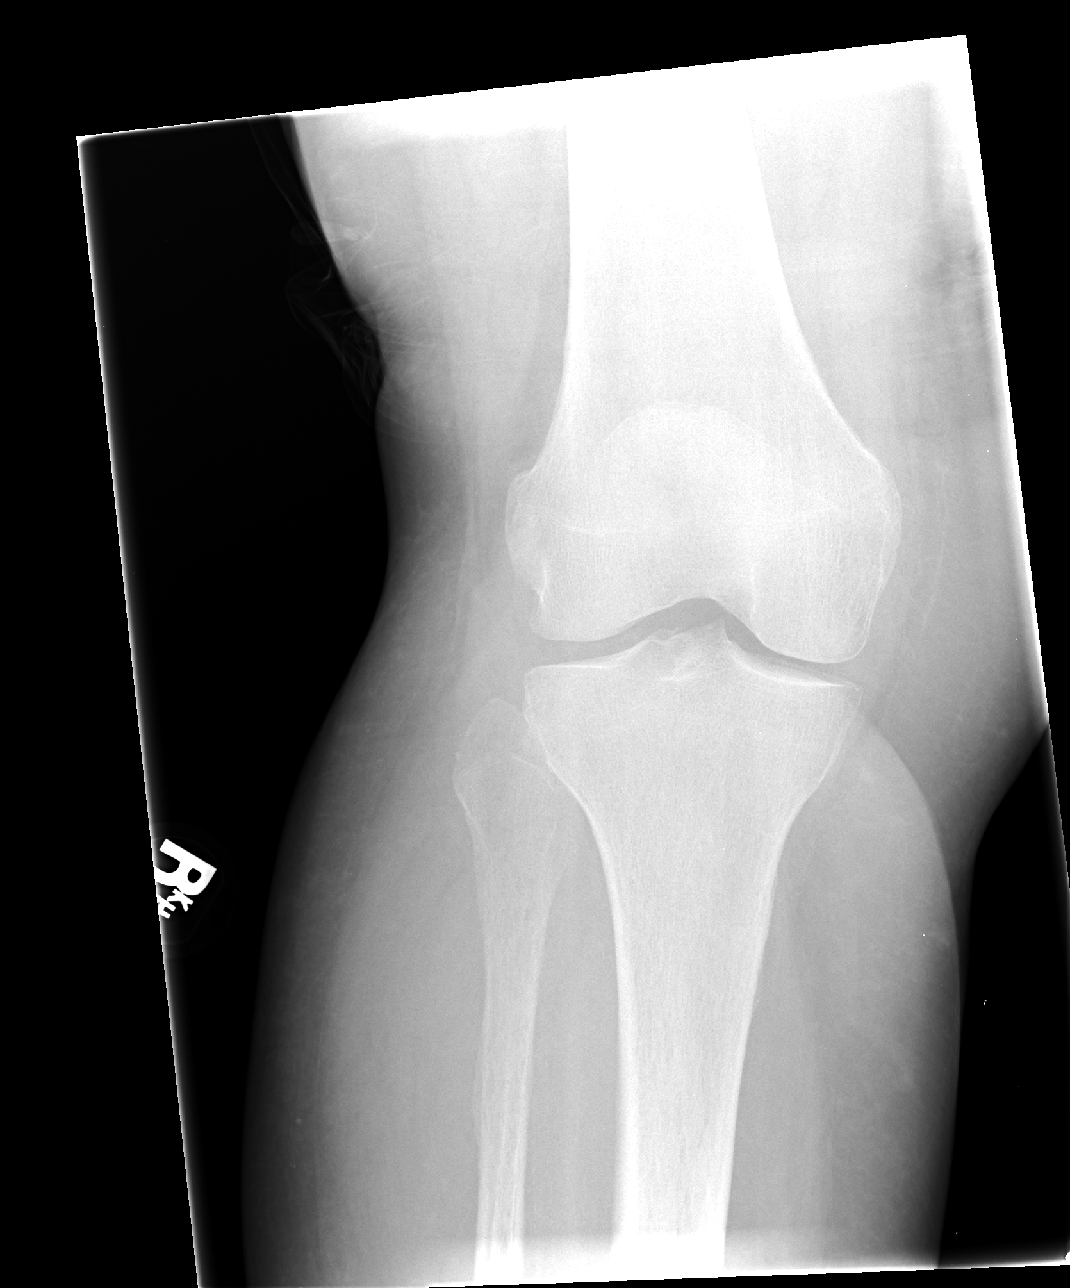

[view not recorded (3 of 4)]
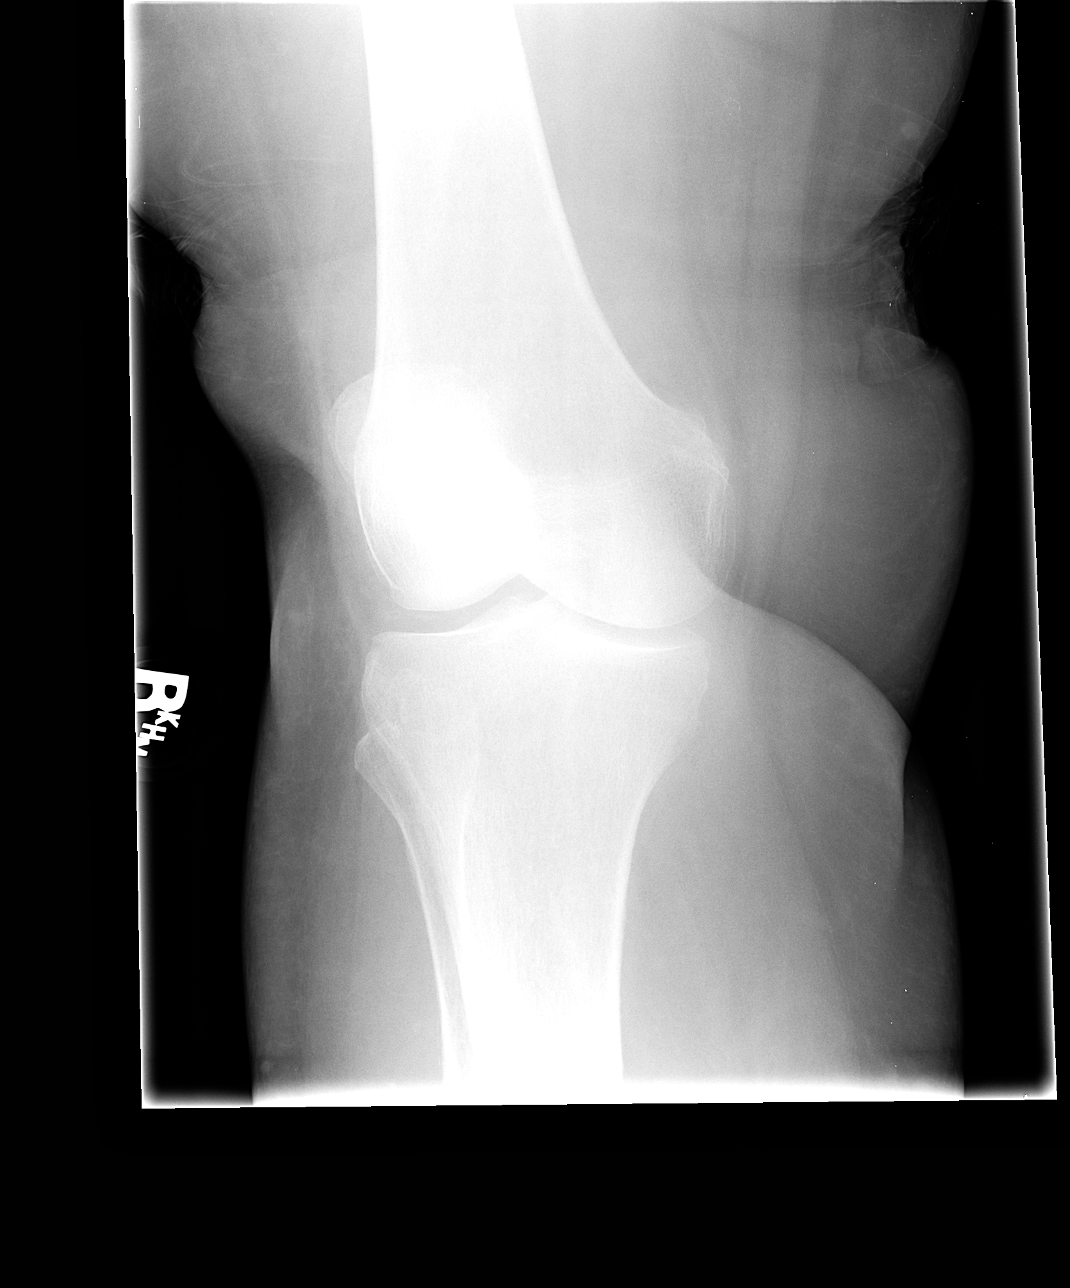

[view not recorded (4 of 4)]
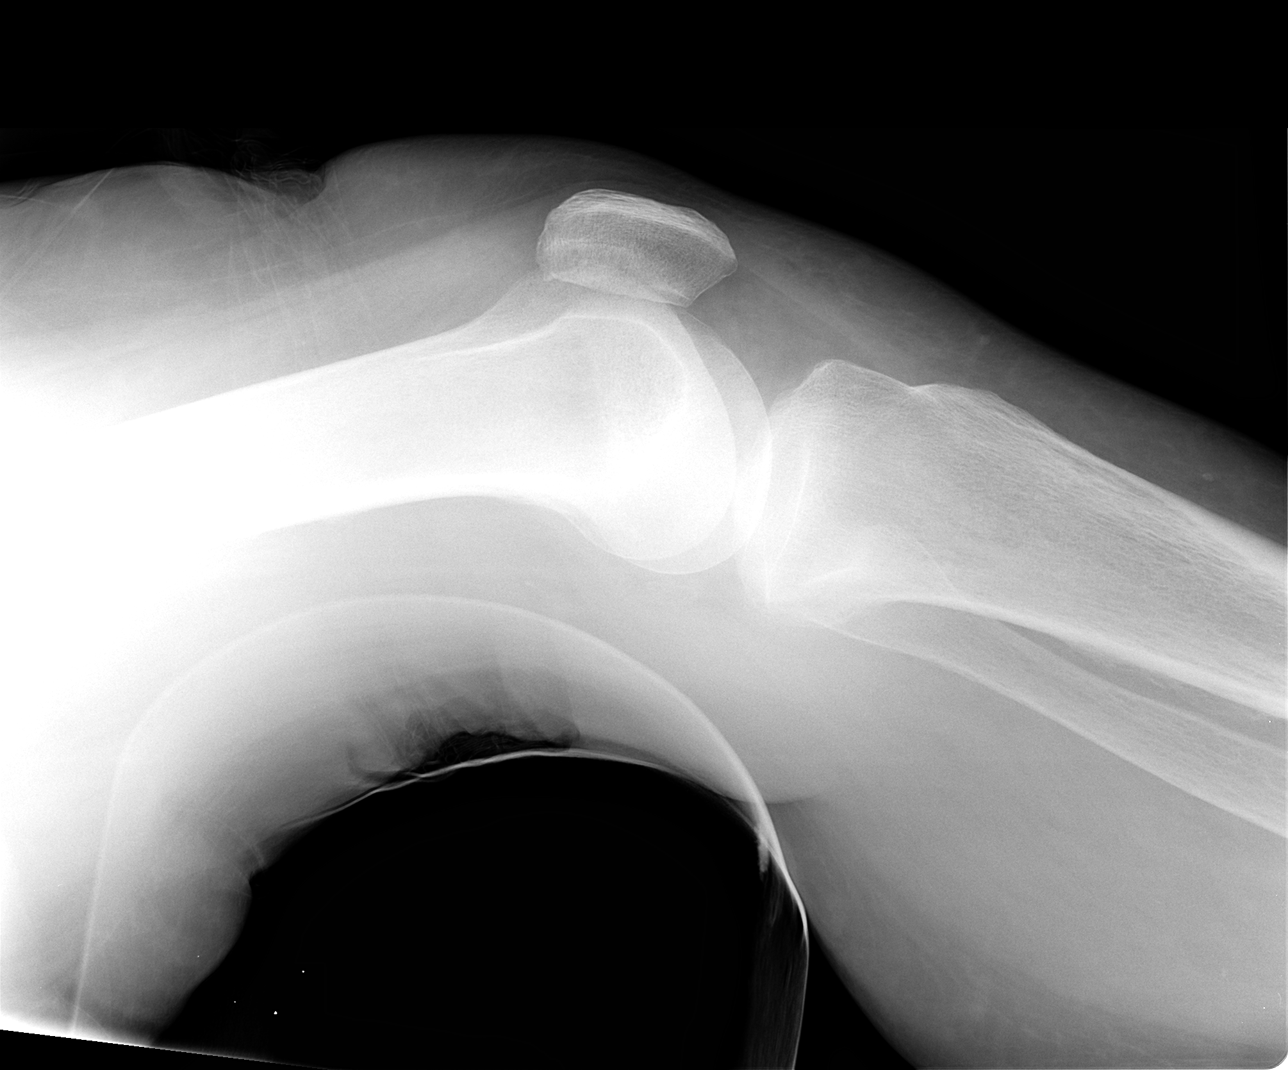

[4 of 4 positions shown; findings below may reference images not displayed]

FINDINGS: Mild tricompartmental degenerative changes.

No fracture or dislocation is seen.

No definite suprapatellar knee joint effusion.
IMPRESSION: Mild tricompartmental degenerative changes.

No fracture or dislocation is seen.

## 2014-06-12 ENCOUNTER — Encounter (HOSPITAL_COMMUNITY): Payer: Self-pay | Admitting: Interventional Cardiology

## 2014-06-16 ENCOUNTER — Telehealth: Payer: Self-pay | Admitting: Gastroenterology

## 2014-06-16 NOTE — Telephone Encounter (Signed)
I called [facility], they did not have rx. Gave them a verbal rx.

## 2014-06-16 NOTE — Telephone Encounter (Signed)
Called and LM on daughters number.

## 2014-06-16 NOTE — Telephone Encounter (Signed)
Patient's daughter called to let us know that the loperamide 2mg  hasn't been called into [facility] yet. Isn't this OTC? Please advise.

## 2014-07-20 ENCOUNTER — Encounter (HOSPITAL_COMMUNITY): Payer: Self-pay | Admitting: *Deleted

## 2014-07-20 ENCOUNTER — Inpatient Hospital Stay (HOSPITAL_COMMUNITY)
Admission: EM | Admit: 2014-07-20 | Discharge: 2014-07-21 | DRG: 151 | Disposition: A | Discharge status: Home / Self Care | Payer: Medicare Other | Attending: Internal Medicine | Admitting: Internal Medicine

## 2014-07-20 ENCOUNTER — Emergency Department (HOSPITAL_COMMUNITY): Payer: Medicare Other

## 2014-07-20 DIAGNOSIS — I482 Chronic atrial fibrillation: Secondary | ICD-10-CM | POA: Diagnosis present

## 2014-07-20 DIAGNOSIS — Z6835 Body mass index (BMI) 35.0-35.9, adult: Secondary | ICD-10-CM | POA: Diagnosis not present

## 2014-07-20 DIAGNOSIS — Z820 Family history of epilepsy and other diseases of the nervous system: Secondary | ICD-10-CM | POA: Diagnosis not present

## 2014-07-20 DIAGNOSIS — I509 Heart failure, unspecified: Secondary | ICD-10-CM | POA: Diagnosis present

## 2014-07-20 DIAGNOSIS — R040 Epistaxis: Principal | ICD-10-CM | POA: Diagnosis present

## 2014-07-20 DIAGNOSIS — E785 Hyperlipidemia, unspecified: Secondary | ICD-10-CM | POA: Diagnosis present

## 2014-07-20 DIAGNOSIS — E039 Hypothyroidism, unspecified: Secondary | ICD-10-CM | POA: Diagnosis present

## 2014-07-20 DIAGNOSIS — Z87891 Personal history of nicotine dependence: Secondary | ICD-10-CM

## 2014-07-20 DIAGNOSIS — E669 Obesity, unspecified: Secondary | ICD-10-CM | POA: Diagnosis present

## 2014-07-20 DIAGNOSIS — Z8249 Family history of ischemic heart disease and other diseases of the circulatory system: Secondary | ICD-10-CM | POA: Diagnosis not present

## 2014-07-20 DIAGNOSIS — G4733 Obstructive sleep apnea (adult) (pediatric): Secondary | ICD-10-CM | POA: Diagnosis present

## 2014-07-20 DIAGNOSIS — J984 Other disorders of lung: Secondary | ICD-10-CM | POA: Diagnosis not present

## 2014-07-20 DIAGNOSIS — D649 Anemia, unspecified: Secondary | ICD-10-CM | POA: Diagnosis not present

## 2014-07-20 DIAGNOSIS — I10 Essential (primary) hypertension: Secondary | ICD-10-CM | POA: Diagnosis present

## 2014-07-20 DIAGNOSIS — K219 Gastro-esophageal reflux disease without esophagitis: Secondary | ICD-10-CM | POA: Diagnosis present

## 2014-07-20 DIAGNOSIS — E119 Type 2 diabetes mellitus without complications: Secondary | ICD-10-CM

## 2014-07-20 DIAGNOSIS — Z823 Family history of stroke: Secondary | ICD-10-CM | POA: Diagnosis not present

## 2014-07-20 DIAGNOSIS — D62 Acute posthemorrhagic anemia: Secondary | ICD-10-CM | POA: Diagnosis present

## 2014-07-20 DIAGNOSIS — I4891 Unspecified atrial fibrillation: Secondary | ICD-10-CM | POA: Diagnosis not present

## 2014-07-20 DIAGNOSIS — I517 Cardiomegaly: Secondary | ICD-10-CM | POA: Diagnosis not present

## 2014-07-20 DIAGNOSIS — D539 Nutritional anemia, unspecified: Secondary | ICD-10-CM | POA: Diagnosis present

## 2014-07-20 DIAGNOSIS — K589 Irritable bowel syndrome without diarrhea: Secondary | ICD-10-CM | POA: Diagnosis present

## 2014-07-20 DIAGNOSIS — Z90710 Acquired absence of both cervix and uterus: Secondary | ICD-10-CM | POA: Diagnosis not present

## 2014-07-20 DIAGNOSIS — Z7901 Long term (current) use of anticoagulants: Secondary | ICD-10-CM | POA: Diagnosis not present

## 2014-07-20 DIAGNOSIS — J90 Pleural effusion, not elsewhere classified: Secondary | ICD-10-CM | POA: Diagnosis not present

## 2014-07-20 LAB — CBC WITH DIFFERENTIAL/PLATELET
Basophils Absolute: 0 10*3/uL (ref 0.0–0.1)
Basophils Relative: 0 % (ref 0–1)
EOS ABS: 0.1 10*3/uL (ref 0.0–0.7)
Eosinophils Relative: 1 % (ref 0–5)
HCT: 28.8 % — ABNORMAL LOW (ref 36.0–46.0)
HEMOGLOBIN: 8.4 g/dL — AB (ref 12.0–15.0)
LYMPHS PCT: 30 % (ref 12–46)
Lymphs Abs: 1.5 10*3/uL (ref 0.7–4.0)
MCH: 24.4 pg — ABNORMAL LOW (ref 26.0–34.0)
MCHC: 29.2 g/dL — ABNORMAL LOW (ref 30.0–36.0)
MCV: 83.7 fL (ref 78.0–100.0)
MONO ABS: 0.6 10*3/uL (ref 0.1–1.0)
MONOS PCT: 11 % (ref 3–12)
NEUTROS ABS: 3 10*3/uL (ref 1.7–7.7)
NEUTROS PCT: 58 % (ref 43–77)
PLATELETS: 241 10*3/uL (ref 150–400)
RBC: 3.44 MIL/uL — AB (ref 3.87–5.11)
RDW: 16.4 % — ABNORMAL HIGH (ref 11.5–15.5)
WBC: 5.2 10*3/uL (ref 4.0–10.5)

## 2014-07-20 LAB — BASIC METABOLIC PANEL
Anion gap: 6 (ref 5–15)
BUN: 12 mg/dL (ref 6–23)
CO2: 25 mmol/L (ref 19–32)
Calcium: 8.8 mg/dL (ref 8.4–10.5)
Chloride: 106 mEq/L (ref 96–112)
Creatinine, Ser: 0.73 mg/dL (ref 0.50–1.10)
GFR calc Af Amer: >90 mL/min (ref >90)
GFR, EST NON AFRICAN AMERICAN: 88 mL/min — AB (ref >90)
Glucose, Bld: 168 mg/dL — ABNORMAL HIGH (ref 70–99)
Potassium: 4.3 mmol/L (ref 3.5–5.1)
Sodium: 137 mmol/L (ref 135–145)

## 2014-07-20 LAB — TSH: TSH: 13.13 u[IU]/mL — AB (ref 0.350–4.500)

## 2014-07-20 LAB — BRAIN NATRIURETIC PEPTIDE: B Natriuretic Peptide: 576 pg/mL — ABNORMAL HIGH (ref 0.0–100.0)

## 2014-07-20 LAB — PROTIME-INR
INR: 1.57 — ABNORMAL HIGH (ref 0.00–1.49)
Prothrombin Time: 18.9 seconds — ABNORMAL HIGH (ref 11.6–15.2)

## 2014-07-20 LAB — POC OCCULT BLOOD, ED: FECAL OCCULT BLD: POSITIVE — AB

## 2014-07-20 LAB — GLUCOSE, CAPILLARY: GLUCOSE-CAPILLARY: 164 mg/dL — AB (ref 70–99)

## 2014-07-20 LAB — TROPONIN I: Troponin I: 0.03 ng/mL (ref <0.031)

## 2014-07-20 MED ORDER — INSULIN GLARGINE 100 UNIT/ML SOLOSTAR PEN: 30.0000 [IU] | PEN_INJECTOR | Freq: Every day | SUBCUTANEOUS | Status: DC

## 2014-07-20 MED ORDER — LEVETIRACETAM 250 MG PO TABS
250.0000 mg | ORAL_TABLET | Freq: Two times a day (BID) | ORAL | Status: DC
  Administered 2014-07-21 (×2): 250 mg via ORAL
  Filled 2014-07-20 (×2): qty 1

## 2014-07-20 MED ORDER — LEVOTHYROXINE SODIUM 100 MCG PO TABS
125.0000 ug | ORAL_TABLET | Freq: Every day | ORAL | Status: DC
  Administered 2014-07-21: 125 ug via ORAL
  Filled 2014-07-20 (×2): qty 1

## 2014-07-20 MED ORDER — INSULIN ASPART 100 UNIT/ML ~~LOC~~ SOLN
0.0000 [IU] | Freq: Three times a day (TID) | SUBCUTANEOUS | Status: DC
  Administered 2014-07-21: 4 [IU] via SUBCUTANEOUS

## 2014-07-20 MED ORDER — OXYMETAZOLINE HCL 0.05 % NA SOLN
NASAL | Status: AC
  Administered 2014-07-20: 21:00:00
  Filled 2014-07-20: qty 15

## 2014-07-20 MED ORDER — VITAMIN B-12 1000 MCG PO TABS
500.0000 ug | ORAL_TABLET | Freq: Every day | ORAL | Status: DC
  Administered 2014-07-21: 500 ug via ORAL
  Filled 2014-07-20: qty 2
  Filled 2014-07-20: qty 1
  Filled 2014-07-20: qty 2

## 2014-07-20 MED ORDER — METAXALONE 800 MG PO TABS
800.0000 mg | ORAL_TABLET | Freq: Three times a day (TID) | ORAL | Status: DC
  Administered 2014-07-21 (×2): 800 mg via ORAL
  Filled 2014-07-20 (×2): qty 1

## 2014-07-20 MED ORDER — INSULIN ASPART 100 UNIT/ML ~~LOC~~ SOLN: 0.0000 [IU] | Freq: Every day | SUBCUTANEOUS | Status: DC

## 2014-07-20 MED ORDER — PANTOPRAZOLE SODIUM 40 MG PO TBEC
40.0000 mg | DELAYED_RELEASE_TABLET | Freq: Every day | ORAL | Status: DC
  Administered 2014-07-21: 40 mg via ORAL
  Filled 2014-07-20: qty 1

## 2014-07-20 MED ORDER — DULOXETINE HCL 30 MG PO CPEP
30.0000 mg | ORAL_CAPSULE | Freq: Two times a day (BID) | ORAL | Status: DC
  Administered 2014-07-21 (×2): 30 mg via ORAL
  Filled 2014-07-20 (×2): qty 1

## 2014-07-20 MED ORDER — METFORMIN HCL 500 MG PO TABS
1000.0000 mg | ORAL_TABLET | Freq: Two times a day (BID) | ORAL | Status: DC
  Administered 2014-07-21: 1000 mg via ORAL
  Filled 2014-07-20: qty 2

## 2014-07-20 MED ORDER — ENSURE COMPLETE PO LIQD
237.0000 mL | Freq: Two times a day (BID) | ORAL | Status: DC
  Administered 2014-07-21: 237 mL via ORAL

## 2014-07-20 MED ORDER — GLIPIZIDE 5 MG PO TABS
10.0000 mg | ORAL_TABLET | Freq: Every day | ORAL | Status: DC
  Administered 2014-07-21: 10 mg via ORAL
  Filled 2014-07-20: qty 2
  Filled 2014-07-20 (×2): qty 1

## 2014-07-20 MED ORDER — LOPERAMIDE HCL 2 MG PO CAPS: 2.0000 mg | ORAL_CAPSULE | Freq: Three times a day (TID) | ORAL | Status: DC | PRN

## 2014-07-20 MED ORDER — SODIUM CHLORIDE 0.9 % IJ SOLN: 3.0000 mL | Freq: Two times a day (BID) | INTRAMUSCULAR | Status: DC

## 2014-07-20 MED ORDER — ONDANSETRON HCL 4 MG/2ML IJ SOLN
4.0000 mg | Freq: Four times a day (QID) | INTRAMUSCULAR | Status: DC | PRN
  Administered 2014-07-20: 4 mg via INTRAVENOUS
  Filled 2014-07-20: qty 2

## 2014-07-20 MED ORDER — INSULIN GLARGINE 100 UNIT/ML ~~LOC~~ SOLN
30.0000 [IU] | Freq: Every day | SUBCUTANEOUS | Status: DC
  Administered 2014-07-21: 30 [IU] via SUBCUTANEOUS
  Filled 2014-07-20 (×2): qty 0.3

## 2014-07-20 MED ORDER — DRONABINOL 2.5 MG PO CAPS
2.5000 mg | ORAL_CAPSULE | Freq: Two times a day (BID) | ORAL | Status: DC
  Administered 2014-07-21: 2.5 mg via ORAL
  Filled 2014-07-20: qty 1

## 2014-07-20 MED ORDER — METOPROLOL TARTRATE 25 MG PO TABS
25.0000 mg | ORAL_TABLET | Freq: Two times a day (BID) | ORAL | Status: DC
  Administered 2014-07-21 (×2): 25 mg via ORAL
  Filled 2014-07-20 (×2): qty 1

## 2014-07-20 MED ORDER — POTASSIUM CHLORIDE CRYS ER 20 MEQ PO TBCR
20.0000 meq | EXTENDED_RELEASE_TABLET | Freq: Two times a day (BID) | ORAL | Status: DC
  Administered 2014-07-21 (×2): 20 meq via ORAL
  Filled 2014-07-20 (×2): qty 1

## 2014-07-20 MED ORDER — BACITRACIN ZINC 500 UNIT/GM EX OINT
TOPICAL_OINTMENT | CUTANEOUS | Status: AC
  Administered 2014-07-20: 21:00:00
  Filled 2014-07-20: qty 1.8

## 2014-07-20 MED ORDER — SODIUM CHLORIDE 0.9 % IV SOLN
INTRAVENOUS | Status: DC
  Administered 2014-07-20: 20:00:00 via INTRAVENOUS

## 2014-07-20 MED ORDER — LORAZEPAM 1 MG PO TABS
1.0000 mg | ORAL_TABLET | Freq: Every day | ORAL | Status: DC | PRN
Start: 2014-07-20 — End: 2014-07-21
  Administered 2014-07-21: 1 mg via ORAL
  Filled 2014-07-20: qty 1

## 2014-07-20 MED ORDER — NITROGLYCERIN 0.4 MG SL SUBL: 0.4000 mg | SUBLINGUAL_TABLET | SUBLINGUAL | Status: DC | PRN

## 2014-07-20 MED ORDER — ONDANSETRON HCL 4 MG PO TABS
4.0000 mg | ORAL_TABLET | Freq: Four times a day (QID) | ORAL | Status: DC | PRN
  Filled 2014-07-20: qty 1

## 2014-07-20 NOTE — ED Notes (Signed)
Pt was attempting to get up to the bedside commode with the NT assistance. When pt was standing up, nose began bleeding. Pressure was applied but the bleeding continued. Dr. [name] was contacted and he deferred immediate treatment to Dr. Manus Gunning. Dr. Manus Gunning notified.

## 2014-07-20 NOTE — ED Notes (Addendum)
Pt states nose bleed (right nares) since 1230 today. Bleeding is currently controlled. HR 178 in triage with hx of afib, pt is on blood thinner.

## 2014-07-20 NOTE — ED Notes (Addendum)
During Dr. Patty Sermons exam, pt began to have epistaxis from the right nare. Applied pressure for 5 minutes and bleeding subsided. Pt instructed to notify staff if it begins to bleed again or she feels the blood going down her throat. Pt verbalized understanding of instructions. Nad noted.

## 2014-07-20 NOTE — ED Notes (Signed)
HR down to 105 after applying monitor to pt in room.

## 2014-07-20 NOTE — H&P (Signed)
[facility] History and Physical  [name] ZOX:[id number] DOB: 14-Aug-1950 DOA: 07/20/2014  Referring physician: ER PCP: [name], MD   Chief Complaint: Epistaxis  HPI: [name] is a 64 y.o. female  This 64 year old lady comes into the emergency room with an episode of epistaxis, nontraumatic, on the right side which started at 12:30 PM. This has overall been controlled with pressure and applying ice at home. She now tells me that she has had 3 other episodes like this in the last 2 weeks. She did not seek medical attention for these episodes. The patient has atrial fibrillation and is on and Eliquis  for this. The patient denies any bleeding from anywhere else such as hematemesis, rectal bleeding, hematuria or hemoptysis. The patient has no cough, dyspnea, chest pain or fever. She denies any dizziness, lightheadedness, nausea or vomiting. She is diabetic. The patient apparently has some cognitive deficits, possibly dementia which limits the history to some degree. In the emergency room, her bleeding was controlled by applying pressure but she was found to have a hemoglobin of 8.4, significantly reduced from previous hemoglobin in the 11 range. She is now being admitted for further management.   Review of Systems:  Apart from symptoms above, all other systems negative.  Past Medical History  Diagnosis Date  . Chest pain     Negative cardiac catheterization in 2002; negative stress nuclear study in 2008  . Congestive heart failure     Normal LV systolic function  . PAF (paroxysmal atrial fibrillation) 2009  . Chronic anticoagulation   . Diabetes mellitus, type 2     Insulin therapy; exacerbated by prednisone  . Syncope     Admitted 05/2009; magnetic resonance imagin/ MRA - negative; etiology thought to be orthostasis secondary to drugs and dehydration  . GERD (gastroesophageal reflux disease)   . IBS (irritable bowel syndrome)   . Hiatal hernia   .  Gastroparesis     99% retention 05/2008 on GES  . Hyperlipidemia   . Hypertension   . Hypothyroid   . Chronic LBP     Surgical intervention in 1996  . Obesity   . OSA on CPAP   . Anemia     H/H of 10/30 with a normal MCV in 12/09  . Barrett's esophagus     Diagnosed 1995. Last EGD 2008.   . Seizures    Past Surgical History  Procedure Laterality Date  . Cardiovascular stress test  2008    Stress nuclear study  . Back surgery  1996  . Laparoscopic cholecystectomy  1990s  . Total abdominal hysterectomy  1999  . Laminectomy  1995    L4-L5  . Carpal tunnel release  1994  . Esophagogastroduodenoscopy  2008    Barrett's without dysplasia. esphagus dilated. antral erosions, h.pylori serologies negative.  . Colonoscopy  11/2011    Dr. Darrick Penna: Internal hemorrhoids, mild diverticulosis. Random colon biopsies negative.  . Givens capsule study  12/07/2011    Proximal small bowel, rare AVM. Distal small bowel, multiple ulcers noted  . Esophagogastroduodenoscopy  11/2011    Dr. Darrick Penna: Barrett's esophagus, mild gastritis, diverticulum in the second portion of the duodenum repeat EGD 3 years. Small bowel biopsies negative. Gastric biopsy show reactive gastropathy but no H. pylori. Esophageal biopsies consistent with GERD. Next EGD 11/2014  . Givens capsule study N/A 09/27/2013    Distal small bowel ulcers extending to TI.  Marland Kitchen Givens capsule study N/A 10/10/2013    Procedure: GIVENS CAPSULE  STUDY;  Surgeon: West Bali, MD;  Location: [facility];  Service: Endoscopy;  Laterality: N/A;  7:30  . Colonoscopy N/A 11/08/2013    SLF: Normal mucosa in the terminal ileum/The colon IS redundant/  Moderate diverticulosis throughout the entire colon. ileum bx benign. colon bx benign  . Esophageal biopsy N/A 11/08/2013    Procedure: BIOPSY  / Tissue sampling / ulcers present in small intestine;  Surgeon: West Bali, MD;  Location: [facility];  Service: Endoscopy;  Laterality: N/A;  . Cardiac  catheterization  2002  . Right heart catheterization N/A 01/10/2014    Procedure: RIGHT HEART CATH;  Surgeon: Lesleigh Noe, MD;  Location: [facility] CATH LAB;  Service: Cardiovascular;  Laterality: N/A;  . Left heart catheterization with coronary angiogram  01/10/2014    Procedure: LEFT HEART CATHETERIZATION WITH CORONARY ANGIOGRAM;  Surgeon: Lesleigh Noe, MD;  Location: [facility] CATH LAB;  Service: Cardiovascular;;   Social History:  reports that she quit smoking about 31 years ago. She has never used smokeless tobacco. She reports that she does not drink alcohol or use illicit drugs.  Allergies  Allergen Reactions  . Ibuprofen     Upsets her stomach and causes abdominal pain  . Celexa [Citalopram Hydrobromide] Other (See Comments)    Dyskinesia  . Codeine Nausea And Vomiting    HALLUCINATIONS  . Reglan [Metoclopramide] Other (See Comments)    DYSKINESIA    Family History  Problem Relation Age of Onset  . Hypertension Mother   . Alzheimer's disease Mother   . Stroke Mother   . Heart attack Mother   . Heart disease Neg Hx   . Hypertension Other   . Colon cancer Neg Hx      Prior to Admission medications   Medication Sig Start Date End Date Taking? Authorizing Provider  apixaban (ELIQUIS) 5 MG TABS tablet Take 1 tablet (5 mg total) by mouth 2 (two) times daily. 12/16/13  Yes Antoine Poche, MD  dronabinol (MARINOL) 2.5 MG capsule Take 1 capsule (2.5 mg total) by mouth 2 (two) times daily before a meal. 06/04/14  Yes West Bali, MD  DULoxetine (CYMBALTA) 30 MG capsule Take 30 mg by mouth 2 (two) times daily.   Yes Historical Provider, MD  glipiZIDE (GLUCOTROL) 10 MG tablet Take 10 mg by mouth daily before breakfast.    Yes Historical Provider, MD  levETIRAcetam (KEPPRA) 250 MG tablet Take 1 tablet (250 mg total) by mouth 2 (two) times daily. 11/10/12  Yes [name], MD  levothyroxine (SYNTHROID, LEVOTHROID) 125 MCG tablet Take 125 mcg by mouth daily.   Yes Historical  Provider, MD  LORazepam (ATIVAN) 1 MG tablet Take 1-2 mg by mouth daily as needed for anxiety or sleep. For anxiety   Yes Historical Provider, MD  metaxalone (SKELAXIN) 800 MG tablet Take 800 mg by mouth 3 (three) times daily.  03/07/14  Yes Historical Provider, MD  metFORMIN (GLUCOPHAGE) 500 MG tablet Take 1,000 mg by mouth 2 (two) times daily with a meal.    Yes Historical Provider, MD  metoprolol tartrate (LOPRESSOR) 25 MG tablet Take 1 tablet (25 mg total) by mouth 2 (two) times daily. 04/12/14  Yes Maryann Mikhail, DO  Multiple Vitamin (MULTIVITAMIN WITH MINERALS) TABS tablet Take 1 tablet by mouth daily.   Yes Historical Provider, MD  omeprazole (PRILOSEC) 20 MG capsule Take 20 mg by mouth 2 (two) times daily.     Yes Historical Provider, MD  ondansetron (ZOFRAN) 4 MG tablet Take 4 mg by mouth every 6 (six) hours as needed. For nausea 03/15/14  Yes Historical Provider, MD  potassium chloride SA (K-DUR,KLOR-CON) 20 MEQ tablet Take 20 mEq by mouth 2 (two) times daily. 06/12/14  Yes Historical Provider, MD  vitamin B-12 (CYANOCOBALAMIN) 500 MCG tablet Take 500 mcg by mouth daily.   Yes Historical Provider, MD  dronabinol (MARINOL) 2.5 MG capsule Take 1 capsule (2.5 mg total) by mouth 2 (two) times daily before a meal. Patient not taking: Reported on 07/20/2014 07/05/14   West Bali, MD  feeding supplement, GLUCERNA SHAKE, (GLUCERNA SHAKE) LIQD Take 237 mLs by mouth 3 (three) times daily between meals. Patient not taking: Reported on 06/04/2014 04/12/14   Nita Sells Mikhail, DO  Insulin Glargine (LANTUS SOLOSTAR) 100 UNIT/ML Solostar Pen Inject 20-50 Units into the skin at bedtime as needed. Patient takes 20 units every night at bedtime if over 178.  Takes 50 units if her blood sugar is above 300.    Historical Provider, MD  loperamide (IMODIUM) 2 MG capsule Take 1 capsule (2 mg total) by mouth 3 (three) times daily as needed for diarrhea or loose stools. 06/04/14   West Bali, MD  nitroGLYCERIN  (NITROSTAT) 0.4 MG SL tablet Place 0.4 mg under the tongue every 5 (five) minutes as needed for chest pain. For chest pain     Historical Provider, MD   Physical Exam: Filed Vitals:   07/20/14 1631 07/20/14 1640 07/20/14 1830 07/20/14 1900  BP: 111/80  131/68 136/85  Pulse: 178 105 110 99  Temp: 98.2 F (36.8 C)     TempSrc: Oral     Resp: Height:  (1.676 m)     Weight: 95.255 kg (210 lb)     SpO2: 100%  99% 95%    Wt Readings from Last 3 Encounters:  07/20/14 95.255 kg (210 lb)  06/04/14 95.8 kg (211 lb 3.2 oz)  04/12/14 78.1 kg (172 lb 2.9 oz)    General:  Appears calm and comfortable. She is not pale. She is hemodynamically stable. Eyes: PERRL, normal lids, irises & conjunctiva ENT: When I was examining her there was oozing again from the  right nostril. Neck: no LAD, masses or thyromegaly Cardiovascular: Irregularly irregular, consistent with atrial fibrillation. No clinical signs of heart failure. Telemetry: Atrial fibrillation, ventricular rate somewhat elevated at 120. Respiratory: Few inspiratory crackles at the bases. No bronchial breathing. No wheezing. Abdomen: soft, ntnd Skin: no rash or induration seen on limited exam Musculoskeletal: grossly normal tone BUE/BLE Psychiatric: grossly normal mood and affect, speech fluent and appropriate Neurologic: grossly non-focal.          Labs on Admission:  Basic Metabolic Panel:  Recent Labs Lab 07/20/14 1702  NA 137  K 4.3  CL 106  CO2 25  GLUCOSE 168*  BUN 12  CREATININE 0.73  CALCIUM 8.8   Liver Function Tests: No results for input(s): AST, ALT, ALKPHOS, BILITOT, PROT, ALBUMIN in the last 168 hours. No results for input(s): LIPASE, AMYLASE in the last 168 hours. No results for input(s): AMMONIA in the last 168 hours. CBC:  Recent Labs Lab 07/20/14 1702  WBC 5.2  NEUTROABS 3.0  HGB 8.4*  HCT 28.8*  MCV 83.7  PLT 241   Cardiac Enzymes:  Recent Labs Lab 07/20/14 1702    TROPONINI 0.03    BNP (last 3 results)  Recent Labs  01/03/14 2253 01/07/14 2029 04/04/14 2327  PROBNP 1015.0* 1495.0* 1689.0*   CBG: No results for input(s): GLUCAP in the last 168 hours.  Radiological Exams on Admission: Dg Chest 1 View  07/20/2014   CLINICAL DATA:  Atrial fibrillation, tachycardia.  EXAM: CHEST - 1 VIEW  COMPARISON:  PA and lateral chest 04/11/2014. Single view of the chest 12/19/2013. CT chest 12/20/2013.  FINDINGS: There is marked cardiomegaly without edema. The patient has a small left pleural effusion and basilar airspace disease. The right lung appears clear. No pneumothorax is identified.  IMPRESSION: Small left pleural effusion and basilar airspace disease which could be atelectasis or pneumonia.  Cardiomegaly without edema.   Electronically Signed   By: Drusilla Kanner M.D.   On: 07/20/2014 17:49    EKG: Independently reviewed. Atrial fibrillation without any acute ST-T wave changes.  Assessment/Plan   1. Recurrent epistaxis. She has had now for such episodes and is on chronic anticoagulation therapy. The anticoagulation therapy will have to be discontinued. The epistaxis will controlled locally with pressure and ice. She may need ENT consultation as an outpatient, however if the bleeding continues, she will need inpatient consultation. 2. Anemia. The anemia is normocytic. She does not have an obvious GI bleed. Recurrent epistaxis is unlikely to explain her drop in hemoglobin although it is possible as she has had 4 episodes in the last couple weeks. Continue to monitor hemoglobin closely. She may need blood transfusion if the hemoglobin drops significantly tomorrow. 3. Chronic atrial fibrillation. Ventricular rate is not significantly elevated, will continue to monitor. Hold any anticoagulation for the time being. Cardiology consultation regarding further recommendations. 4. Diabetes. Continue home medications and sliding scale of insulin. 5. Hypertension,  stable.  Further recommendations will depend on patient's hospital progress.   Code Status: Full code.  DVT Prophylaxis: SCDs.  Family Communication: I discussed the plan with the patient at the bedside.   Disposition Plan: Home when medically stable.   Time spent: 60 minutes.  Wilson Singer [facility] Pager [phone].

## 2014-07-20 NOTE — ED Provider Notes (Signed)
CSN: [id number]     Arrival date & time 07/20/14  1627 History   First MD Initiated Contact with Patient 07/20/14 1645     Chief Complaint  Patient presents with  . Epistaxis  . Tachycardia     (Consider location/radiation/quality/duration/timing/severity/associated sxs/prior Treatment) HPI Comments: Patient from home with intermittent right-sided nosebleed since 12:30 today. Bleeding controlled with pressure and applying ice at home. She is on Eliquis for atrial fibrillation. She had one previous episode of bleeding several years ago that required packing. She denies any SOB or chest pain. She states compliance with her other medications. She feels her breathing is at baseline. She has permanent atrial fibrillation is on Eliquis. She denies any dizziness, lightheadedness, nausea or vomiting.  The history is provided by the patient and a relative. The history is limited by the condition of the patient.    Past Medical History  Diagnosis Date  . Chest pain     Negative cardiac catheterization in 2002; negative stress nuclear study in 2008  . Congestive heart failure     Normal LV systolic function  . PAF (paroxysmal atrial fibrillation) 2009  . Chronic anticoagulation   . Diabetes mellitus, type 2     Insulin therapy; exacerbated by prednisone  . Syncope     Admitted 05/2009; magnetic resonance imagin/ MRA - negative; etiology thought to be orthostasis secondary to drugs and dehydration  . GERD (gastroesophageal reflux disease)   . IBS (irritable bowel syndrome)   . Hiatal hernia   . Gastroparesis     99% retention 05/2008 on GES  . Hyperlipidemia   . Hypertension   . Hypothyroid   . Chronic LBP     Surgical intervention in 1996  . Obesity   . OSA on CPAP   . Anemia     H/H of 10/30 with a normal MCV in 12/09  . Barrett's esophagus     Diagnosed 1995. Last EGD 2008.   . Seizures    Past Surgical History  Procedure Laterality Date  . Cardiovascular stress test  2008     Stress nuclear study  . Back surgery  1996  . Laparoscopic cholecystectomy  1990s  . Total abdominal hysterectomy  1999  . Laminectomy  1995    L4-L5  . Carpal tunnel release  1994  . Esophagogastroduodenoscopy  2008    Barrett's without dysplasia. esphagus dilated. antral erosions, h.pylori serologies negative.  . Colonoscopy  11/2011    Dr. Darrick Penna: Internal hemorrhoids, mild diverticulosis. Random colon biopsies negative.  . Givens capsule study  12/07/2011    Proximal small bowel, rare AVM. Distal small bowel, multiple ulcers noted  . Esophagogastroduodenoscopy  11/2011    Dr. Darrick Penna: Barrett's esophagus, mild gastritis, diverticulum in the second portion of the duodenum repeat EGD 3 years. Small bowel biopsies negative. Gastric biopsy show reactive gastropathy but no H. pylori. Esophageal biopsies consistent with GERD. Next EGD 11/2014  . Givens capsule study N/A 09/27/2013    Distal small bowel ulcers extending to TI.  Marland Kitchen Givens capsule study N/A 10/10/2013    Procedure: GIVENS CAPSULE STUDY;  Surgeon: West Bali, MD;  Location: [facility];  Service: Endoscopy;  Laterality: N/A;  7:30  . Colonoscopy N/A 11/08/2013    SLF: Normal mucosa in the terminal ileum/The colon IS redundant/  Moderate diverticulosis throughout the entire colon. ileum bx benign. colon bx benign  . Esophageal biopsy N/A 11/08/2013    Procedure: BIOPSY  / Tissue sampling / ulcers present  in small intestine;  Surgeon: West Bali, MD;  Location: [facility];  Service: Endoscopy;  Laterality: N/A;  . Cardiac catheterization  2002  . Right heart catheterization N/A 01/10/2014    Procedure: RIGHT HEART CATH;  Surgeon: Lesleigh Noe, MD;  Location[facility] CATH LAB;  Service: Cardiovascular;  Laterality: N/A;  . Left heart catheterization with coronary angiogram  01/10/2014    Procedure: LEFT HEART CATHETERIZATION WITH CORONARY ANGIOGRAM;  Surgeon: Lesleigh Noe, MD;  Location: [facility] CATH LAB;  Service: Cardiovascular;;    Family History  Problem Relation Age of Onset  . Hypertension Mother   . Alzheimer's disease Mother   . Stroke Mother   . Heart attack Mother   . Heart disease Neg Hx   . Hypertension Other   . Colon cancer Neg Hx    History  Substance Use Topics  . Smoking status: Former Smoker -- 15 years    Quit date: 07/01/1983  . Smokeless tobacco: Never Used     Comment: quit in 1984  . Alcohol Use: No     Comment: last etoh one year ago, never frequent   OB History    Gravida Para Term Preterm AB TAB SAB Ectopic Multiple Living   Review of Systems  Constitutional: Negative for fever, activity change and appetite change.  HENT: Positive for nosebleeds.   Respiratory: Negative for cough.   Cardiovascular: Negative for chest pain and leg swelling.  Gastrointestinal: Negative for nausea, vomiting and abdominal pain.  Genitourinary: Negative for dysuria and hematuria.  Musculoskeletal: Negative for myalgias and arthralgias.  Skin: Negative for rash.  Neurological: Negative for dizziness, light-headedness and numbness.  A complete 10 system review of systems was obtained and all systems are negative except as noted in the HPI and PMH.      Allergies  Ibuprofen; Celexa; Codeine; and Reglan  Home Medications   Prior to Admission medications   Medication Sig Start Date End Date Taking? Authorizing Provider  apixaban (ELIQUIS) 5 MG TABS tablet Take 1 tablet (5 mg total) by mouth 2 (two) times daily. 12/16/13  Yes Antoine Poche, MD  dronabinol (MARINOL) 2.5 MG capsule Take 1 capsule (2.5 mg total) by mouth 2 (two) times daily before a meal. 06/04/14  Yes West Bali, MD  DULoxetine (CYMBALTA) 30 MG capsule Take 30 mg by mouth 2 (two) times daily.   Yes Historical Provider, MD  glipiZIDE (GLUCOTROL) 10 MG tablet Take 10 mg by mouth daily before breakfast.    Yes Historical Provider, MD  levETIRAcetam (KEPPRA) 250 MG tablet Take 1 tablet (250 mg total) by mouth 2  (two) times daily. 11/10/12  Yes [name], MD  levothyroxine (SYNTHROID, LEVOTHROID) 125 MCG tablet Take 125 mcg by mouth daily.   Yes Historical Provider, MD  LORazepam (ATIVAN) 1 MG tablet Take 1-2 mg by mouth daily as needed for anxiety or sleep. For anxiety   Yes Historical Provider, MD  metaxalone (SKELAXIN) 800 MG tablet Take 800 mg by mouth 3 (three) times daily.  03/07/14  Yes Historical Provider, MD  metFORMIN (GLUCOPHAGE) 500 MG tablet Take 1,000 mg by mouth 2 (two) times daily with a meal.    Yes Historical Provider, MD  metoprolol tartrate (LOPRESSOR) 25 MG tablet Take 1 tablet (25 mg total) by mouth 2 (two) times daily. 04/12/14  Yes Maryann Mikhail, DO  Multiple Vitamin (MULTIVITAMIN WITH  MINERALS) TABS tablet Take 1 tablet by mouth daily.   Yes Historical Provider, MD  omeprazole (PRILOSEC) 20 MG capsule Take 20 mg by mouth 2 (two) times daily.     Yes Historical Provider, MD  ondansetron (ZOFRAN) 4 MG tablet Take 4 mg by mouth every 6 (six) hours as needed. For nausea 03/15/14  Yes Historical Provider, MD  potassium chloride SA (K-DUR,KLOR-CON) 20 MEQ tablet Take 20 mEq by mouth 2 (two) times daily. 06/12/14  Yes Historical Provider, MD  vitamin B-12 (CYANOCOBALAMIN) 500 MCG tablet Take 500 mcg by mouth daily.   Yes Historical Provider, MD  dronabinol (MARINOL) 2.5 MG capsule Take 1 capsule (2.5 mg total) by mouth 2 (two) times daily before a meal. Patient not taking: Reported on 07/20/2014 07/05/14   West Bali, MD  feeding supplement, GLUCERNA SHAKE, (GLUCERNA SHAKE) LIQD Take 237 mLs by mouth 3 (three) times daily between meals. Patient not taking: Reported on 06/04/2014 04/12/14   Nita Sells Mikhail, DO  Insulin Glargine (LANTUS SOLOSTAR) 100 UNIT/ML Solostar Pen Inject 20-50 Units into the skin at bedtime as needed. Patient takes 20 units every night at bedtime if over 178.  Takes 50 units if her blood sugar is above 300.    Historical Provider, MD  loperamide (IMODIUM) 2 MG  capsule Take 1 capsule (2 mg total) by mouth 3 (three) times daily as needed for diarrhea or loose stools. 06/04/14   West Bali, MD  nitroGLYCERIN (NITROSTAT) 0.4 MG SL tablet Place 0.4 mg under the tongue every 5 (five) minutes as needed for chest pain. For chest pain     Historical Provider, MD   BP 111/80 mmHg  Pulse 105  Temp(Src) 98.2 F (36.8 C) (Oral)  Resp 18  Ht 5\' 6"  (1.676 m)  Wt 210 lb (95.255 kg)  BMI 33.91 kg/m2  SpO2 100% Physical Exam  Constitutional: She is oriented to person, place, and time. She appears well-developed and well-nourished. No distress.  HENT:  Head: Normocephalic and atraumatic.  Mouth/Throat: Oropharynx is clear and moist. No oropharyngeal exudate.  Minimal Dried blood to R nare. No septal hematoma. No blood in oropharynx  Eyes: Conjunctivae and EOM are normal. Pupils are equal, round, and reactive to light.  Neck: Normal range of motion. Neck supple.  No meningismus.  Cardiovascular: Normal rate, normal heart sounds and intact distal pulses.   No murmur heard. Irregular HR 100-120s  Pulmonary/Chest: Effort normal and breath sounds normal. No respiratory distress.  Abdominal: Soft. There is no tenderness. There is no rebound and no guarding.  Musculoskeletal: Normal range of motion. She exhibits no edema or tenderness.  Neurological: She is alert and oriented to person, place, and time. No cranial nerve deficit. She exhibits normal muscle tone. Coordination normal.  No ataxia on finger to nose bilaterally. No pronator drift. 5/5 strength throughout. CN 2-12 intact. Negative Romberg. Equal grip strength. Sensation intact. Gait is normal.   Skin: Skin is warm.  Psychiatric: She has a normal mood and affect. Her behavior is normal.  Nursing note and vitals reviewed.   ED Course  EPISTAXIS MANAGEMENT Date/Time: 07/20/2014 9:00 PM Performed by: Glynn Octave Authorized by: Glynn Octave Consent: Verbal consent obtained. Risks and  benefits: risks, benefits and alternatives were discussed Consent given by: patient Patient understanding: patient states understanding of the procedure being performed Patient consent: the patient's understanding of the procedure matches consent given Patient identity confirmed: verbally with patient and provided demographic data Time out: Immediately prior to procedure  a "time out" was called to verify the correct patient, procedure, equipment, support staff and site/side marked as required. Local anesthetic: topical anesthetic Patient sedated: no Treatment site: right posterior and right anterior Repair method: anterior pack, cophenylcaine and suction Post-procedure assessment: bleeding stopped Treatment complexity: complex Recurrence: recurrence of recent bleed Patient tolerance: Patient tolerated the procedure well with no immediate complications   (including critical care time) Labs Review Labs Reviewed  CBC WITH DIFFERENTIAL - Abnormal; Notable for the following:    RBC 3.44 (*)    Hemoglobin 8.4 (*)    HCT 28.8 (*)    MCH 24.4 (*)    MCHC 29.2 (*)    RDW 16.4 (*)    All other components within normal limits  BASIC METABOLIC PANEL - Abnormal; Notable for the following:    Glucose, Bld 168 (*)    GFR calc non Af Amer 88 (*)    All other components within normal limits  BRAIN NATRIURETIC PEPTIDE - Abnormal; Notable for the following:    B Natriuretic Peptide 576.0 (*)    All other components within normal limits  PROTIME-INR - Abnormal; Notable for the following:    Prothrombin Time 18.9 (*)    INR 1.57 (*)    All other components within normal limits  POC OCCULT BLOOD, ED - Abnormal; Notable for the following:    Fecal Occult Bld POSITIVE (*)    All other components within normal limits  TROPONIN I    Imaging Review Dg Chest 1 View  07/20/2014   CLINICAL DATA:  Atrial fibrillation, tachycardia.  EXAM: CHEST - 1 VIEW  COMPARISON:  PA and lateral chest 04/11/2014.  Single view of the chest 12/19/2013. CT chest 12/20/2013.  FINDINGS: There is marked cardiomegaly without edema. The patient has a small left pleural effusion and basilar airspace disease. The right lung appears clear. No pneumothorax is identified.  IMPRESSION: Small left pleural effusion and basilar airspace disease which could be atelectasis or pneumonia.  Cardiomegaly without edema.   Electronically Signed   By: Drusilla Kanner M.D.   On: 07/20/2014 17:49     EKG Interpretation   Date/Time:  Sunday July 20 2014 16:38:24 EST Ventricular Rate:  92 PR Interval:    QRS Duration: 90 QT Interval:  425 QTC Calculation: 526 R Axis:   -49 Text Interpretation:  Atrial fibrillation Left anterior fascicular block  Low voltage, precordial leads Abnormal R-wave progression, early  transition Nonspecific T abnrm, anterolateral leads Prolonged QT interval  Lateral T wave changes improved Confirmed by Manus Gunning  MD, Ko Bardon ([id number])  on 07/20/2014 5:06:35 PM      MDM   Final diagnoses:  Atrial fibrillation  Anemia, unspecified anemia type   epistaxis with history of blood thinner use. No chest pain or shortness of breath. Atrial fibrillation that is rate controlled. On Eliquis. Intermittent nosebleeds over past 2 weeks.  Nose bleed controlled on arrival.  Hemoglobin 8.4 which is decreased from her baseline of 11. No further nose bleeding in the ED. Hemoccult positive. afib 100-120s on monitor. BP stable.  Suspect drop in hemoglobin likely secondary to GI source rather than primary nosebleed. Patient with colonoscopy in May 2015 that showed diverticulosis.  Given her rapid A. fib, drop in hemoglobin, and anticoagulant use, will observe.  D/w Dr. [name].  Prior to transfer to floor, patient developed bleeding again from R nare.  Controlled with pressure and afrin.  Rapid rhino placed due to persistent bleeding in posterior pharynx.  Observed 30  minutes before transfer to floor. No further  bleeding. OP clear.  BP stable  BP 128/62 mmHg  Pulse 80  Temp(Src) 98.4 F (36.9 C) (Oral)  Resp 22  Ht 5\' 6"  (1.676 m)  Wt 218 lb 6.4 oz (99.066 kg)  BMI 35.27 kg/m2  SpO2 97%   Glynn Octave, MD 07/20/14 2304

## 2014-07-20 NOTE — ED Notes (Signed)
Assisted Dr. Manus Gunning in applying Afrin to the right nare and playing a rhino bullet. No bleeding noted coming from the nostril after the bullet in place. Will observe patient in the ED for an additional 30 minutes before transferring pt to the Med-Surg floor.

## 2014-07-21 LAB — CBC
HEMATOCRIT: 29.1 % — AB (ref 36.0–46.0)
Hemoglobin: 8.7 g/dL — ABNORMAL LOW (ref 12.0–15.0)
MCH: 24.9 pg — AB (ref 26.0–34.0)
MCHC: 29.9 g/dL — ABNORMAL LOW (ref 30.0–36.0)
MCV: 83.4 fL (ref 78.0–100.0)
PLATELETS: 250 10*3/uL (ref 150–400)
RBC: 3.49 MIL/uL — ABNORMAL LOW (ref 3.87–5.11)
RDW: 16.6 % — ABNORMAL HIGH (ref 11.5–15.5)
WBC: 6.2 10*3/uL (ref 4.0–10.5)

## 2014-07-21 LAB — COMPREHENSIVE METABOLIC PANEL
ALBUMIN: 3.6 g/dL (ref 3.5–5.2)
ALT: 43 U/L — ABNORMAL HIGH (ref 0–35)
ANION GAP: 6 (ref 5–15)
AST: 25 U/L (ref 0–37)
Alkaline Phosphatase: 48 U/L (ref 39–117)
BUN: 15 mg/dL (ref 6–23)
CALCIUM: 8.8 mg/dL (ref 8.4–10.5)
CO2: 25 mmol/L (ref 19–32)
Chloride: 106 mEq/L (ref 96–112)
Creatinine, Ser: 0.71 mg/dL (ref 0.50–1.10)
GFR calc Af Amer: >90 mL/min (ref >90)
GFR calc non Af Amer: 89 mL/min — ABNORMAL LOW (ref >90)
Glucose, Bld: 171 mg/dL — ABNORMAL HIGH (ref 70–99)
Potassium: 4.3 mmol/L (ref 3.5–5.1)
Sodium: 137 mmol/L (ref 135–145)
Total Bilirubin: 0.8 mg/dL (ref 0.3–1.2)
Total Protein: 6.4 g/dL (ref 6.0–8.3)

## 2014-07-21 LAB — GLUCOSE, CAPILLARY
GLUCOSE-CAPILLARY: 120 mg/dL — AB (ref 70–99)
Glucose-Capillary: 160 mg/dL — ABNORMAL HIGH (ref 70–99)

## 2014-07-21 NOTE — Discharge Instructions (Signed)
Keep rocket in your nostril until seen by ENT in 3-4 days.

## 2014-07-21 NOTE — Care Management Utilization Note (Signed)
UR completed 

## 2014-07-21 NOTE — Care Management Note (Signed)
    Page 1 of 1   07/21/2014     1:04:33 PM CARE MANAGEMENT NOTE 07/21/2014  Patient:  [name], [name]   Account Number:  [id number]  Date Initiated:  07/21/2014  Documentation initiated by:  Kathyrn Sheriff  Subjective/Objective Assessment:   Pt is admitted with epitaxis. Pt is from home, lives with husband and daughter. Pt has no HH services, DME's or med needs prior to admission. Pt has used [facility] in the past.     Action/Plan:   Pt plans to discharge home with self care today. Pt has no CM needs at this time.   Anticipated DC Date:  07/21/2014   Anticipated DC Plan:  HOME/SELF CARE      DC Planning Services  CM consult      Choice offered to / List presented to:             Status of service:  Completed, signed off Medicare Important Message given?   (If response is "NO", the following Medicare IM given date fields will be blank) Date Medicare IM given:   Medicare IM given by:   Date Additional Medicare IM given:   Additional Medicare IM given by:    Discharge Disposition:  HOME/SELF CARE  Per UR Regulation:    If discussed at Long Length of Stay Meetings, dates discussed:    Comments:  07/21/2014 1300 Kathyrn Sheriff, RN, MSN, [facility]

## 2014-07-21 NOTE — Discharge Summary (Signed)
Physician Discharge Summary  [name]:[id number] DOB: Oct 07, 1950 DOA: 07/20/2014  PCP: [name], MD  Admit date: 07/20/2014 Discharge date: 07/21/2014  Time spent: 45 minutes  Recommendations for Outpatient Follow-up:  -Will be discharged home today. -Rhino rocket is in place and has instructions to follow up with ENT in 3-4 days for removal.   Discharge Diagnoses:  Active Problems:   Essential hypertension   Obesity   Atrial fibrillation with RVR   DM type 2 (diabetes mellitus, type 2)   Anemia   Epistaxis, recurrent   Discharge Condition: Stable and improved  Filed Weights   07/20/14 1631 07/20/14 2157  Weight: 95.255 kg (210 lb) 99.066 kg (218 lb 6.4 oz)    History of present illness:  This 64 year old lady comes into the emergency room with an episode of epistaxis, nontraumatic, on the right side which started at 12:30 PM. This has overall been controlled with pressure and applying ice at home. She now tells me that she has had 3 other episodes like this in the last 2 weeks. She did not seek medical attention for these episodes. The patient has atrial fibrillation and is on and Eliquis for this. The patient denies any bleeding from anywhere else such as hematemesis, rectal bleeding, hematuria or hemoptysis. The patient has no cough, dyspnea, chest pain or fever. She denies any dizziness, lightheadedness, nausea or vomiting. She is diabetic. The patient apparently has some cognitive deficits, possibly dementia which limits the history to some degree. In the emergency room, her bleeding was controlled by applying pressure but she was found to have a hemoglobin of 8.4, significantly reduced from previous hemoglobin in the 11 range. She is now being admitted for further management.  Hospital Course:   Epistaxis -Rhino rocket placed by ED. -No further bleeding while in the hospital. -Has had 2 other minor nosebleeds within 2 weeks. -If has further  episodes may need to consider discontinuation of her anticoagulation for her a fib, but feel it is safe to continue for now. -Will secure follow up with ENT in [place] over next 3-4 days. -Advised to DC anticoagulation and proceed to the ER immediately if epistaxis recurs prior to ENT appointment.  ABLA -Hb has maintained around 8.5, and thus has not been transfused this admission.  Chronic A Fib -Rate controlled. -Anticoagulated. See above.  HTN -Well controlled. -Continue home meds.  DM -Well controlled. -Continue home meds,   Procedures:  None   Consultations:  None  Discharge Instructions  Discharge Instructions    Diet - low sodium heart healthy    Complete by:  As directed      Increase activity slowly    Complete by:  As directed             Medication List    TAKE these medications        apixaban 5 MG Tabs tablet  Commonly known as:  ELIQUIS  Take 1 tablet (5 mg total) by mouth 2 (two) times daily.     dronabinol 2.5 MG capsule  Commonly known as:  MARINOL  Take 1 capsule (2.5 mg total) by mouth 2 (two) times daily before a meal.     DULoxetine 30 MG capsule  Commonly known as:  CYMBALTA  Take 30 mg by mouth 2 (two) times daily.     feeding supplement (GLUCERNA SHAKE) Liqd  Take 237 mLs by mouth 3 (three) times daily between meals.     glipiZIDE 10 MG tablet  Commonly known as:  GLUCOTROL  Take 10 mg by mouth daily before breakfast.     LANTUS SOLOSTAR 100 UNIT/ML Solostar Pen  Generic drug:  Insulin Glargine  Inject 20-50 Units into the skin at bedtime as needed. Patient takes 20 units every night at bedtime if over 178.  Takes 50 units if her blood sugar is above 300.     levETIRAcetam 250 MG tablet  Commonly known as:  KEPPRA  Take 1 tablet (250 mg total) by mouth 2 (two) times daily.     levothyroxine 125 MCG tablet  Commonly known as:  SYNTHROID, LEVOTHROID  Take 125 mcg by mouth daily.     loperamide 2 MG capsule  Commonly known  as:  IMODIUM  Take 1 capsule (2 mg total) by mouth 3 (three) times daily as needed for diarrhea or loose stools.     LORazepam 1 MG tablet  Commonly known as:  ATIVAN  Take 1-2 mg by mouth daily as needed for anxiety or sleep. For anxiety     metaxalone 800 MG tablet  Commonly known as:  SKELAXIN  Take 800 mg by mouth 3 (three) times daily.     metFORMIN 500 MG tablet  Commonly known as:  GLUCOPHAGE  Take 1,000 mg by mouth 2 (two) times daily with a meal.     metoprolol tartrate 25 MG tablet  Commonly known as:  LOPRESSOR  Take 1 tablet (25 mg total) by mouth 2 (two) times daily.     multivitamin with minerals Tabs tablet  Take 1 tablet by mouth daily.     nitroGLYCERIN 0.4 MG SL tablet  Commonly known as:  NITROSTAT  - Place 0.4 mg under the tongue every 5 (five) minutes as needed for chest pain. For chest pain  -      omeprazole 20 MG capsule  Commonly known as:  PRILOSEC  Take 20 mg by mouth 2 (two) times daily.     ondansetron 4 MG tablet  Commonly known as:  ZOFRAN  Take 4 mg by mouth every 6 (six) hours as needed. For nausea     potassium chloride SA 20 MEQ tablet  Commonly known as:  K-DUR,KLOR-CON  Take 20 mEq by mouth 2 (two) times daily.     vitamin B-12 500 MCG tablet  Commonly known as:  CYANOCOBALAMIN  Take 500 mcg by mouth daily.       Allergies  Allergen Reactions  . Ibuprofen     Upsets her stomach and causes abdominal pain  . Celexa [Citalopram Hydrobromide] Other (See Comments)    Dyskinesia  . Codeine Nausea And Vomiting    HALLUCINATIONS  . Reglan [Metoclopramide] Other (See Comments)    DYSKINESIA       Follow-up Information    Follow up with [name], MD. Schedule an appointment as soon as possible for a visit in 2 weeks.   Specialty:  Family Medicine   Contact information:   [address] [place] [address] [phone]       Follow up with Flo Shanks, MD. Schedule an appointment as soon as possible for  a visit in 4 days.   Specialty:  Otolaryngology   Contact information:   [address] [place] [address] [phone]        The results of significant diagnostics from this hospitalization (including imaging, microbiology, ancillary and laboratory) are listed below for reference.    Significant Diagnostic Studies: Dg Chest 1 View  07/20/2014   CLINICAL DATA:  Atrial fibrillation, tachycardia.  EXAM: CHEST - 1 VIEW  COMPARISON:  PA and lateral chest 04/11/2014. Single view of the chest 12/19/2013. CT chest 12/20/2013.  FINDINGS: There is marked cardiomegaly without edema. The patient has a small left pleural effusion and basilar airspace disease. The right lung appears clear. No pneumothorax is identified.  IMPRESSION: Small left pleural effusion and basilar airspace disease which could be atelectasis or pneumonia.  Cardiomegaly without edema.   Electronically Signed   By: Drusilla Kanner M.D.   On: 07/20/2014 17:49    Microbiology: No results found for this or any previous visit (from the past 240 hour(s)).   Labs: Basic Metabolic Panel:  Recent Labs Lab 07/20/14 1702 07/21/14 0551  NA 137 137  K 4.3 4.3  CL 106 106  CO2 25 25  GLUCOSE 168* 171*  BUN 12 15  CREATININE 0.73 0.71  CALCIUM 8.8 8.8   Liver Function Tests:  Recent Labs Lab 07/21/14 0551  AST 25  ALT 43*  ALKPHOS 48  BILITOT 0.8  PROT 6.4  ALBUMIN 3.6   No results for input(s): LIPASE, AMYLASE in the last 168 hours. No results for input(s): AMMONIA in the last 168 hours. CBC:  Recent Labs Lab 07/20/14 1702 07/21/14 0551  WBC 5.2 6.2  NEUTROABS 3.0  --   HGB 8.4* 8.7*  HCT 28.8* 29.1*  MCV 83.7 83.4  PLT 241 250   Cardiac Enzymes:  Recent Labs Lab 07/20/14 1702  TROPONINI 0.03   BNP: BNP (last 3 results)  Recent Labs  01/03/14 2253 01/07/14 2029 04/04/14 2327  PROBNP 1015.0* 1495.0* 1689.0*   CBG:  Recent Labs Lab 07/20/14 2200 07/21/14 0759  07/21/14 1114  GLUCAP 164* 160* 120*       Signed:  HERNANDEZ ACOSTA,ESTELA  [facility] Pager: [phone] 07/21/2014, 4:36 PM

## 2014-07-21 NOTE — Progress Notes (Signed)
Patient was discharged home today with rhino rocket in place.  Patient was given instruction to make appointment with the ENT within the next 3-4 days and leave rhino rocket in place until that appointment.  Patient was given discharge instructions and verbalized understanding with no complaints or concerns voiced at this time.  IV was removed with catheter intact, no bleeding or complications.  Patient left unit in stable condition by staff member in wheelchair.

## 2014-07-24 ENCOUNTER — Other Ambulatory Visit: Payer: Self-pay | Admitting: Cardiology

## 2014-07-24 ENCOUNTER — Other Ambulatory Visit: Payer: Self-pay | Admitting: Gastroenterology

## 2014-07-24 DIAGNOSIS — R040 Epistaxis: Secondary | ICD-10-CM | POA: Diagnosis not present

## 2014-07-24 DIAGNOSIS — Z7901 Long term (current) use of anticoagulants: Secondary | ICD-10-CM | POA: Diagnosis not present

## 2014-08-22 ENCOUNTER — Other Ambulatory Visit (HOSPITAL_COMMUNITY): Payer: Self-pay | Admitting: Family Medicine

## 2014-08-22 DIAGNOSIS — E1165 Type 2 diabetes mellitus with hyperglycemia: Secondary | ICD-10-CM | POA: Diagnosis not present

## 2014-08-22 DIAGNOSIS — R1084 Generalized abdominal pain: Secondary | ICD-10-CM

## 2014-08-22 DIAGNOSIS — I482 Chronic atrial fibrillation: Secondary | ICD-10-CM | POA: Diagnosis not present

## 2014-08-22 DIAGNOSIS — R634 Abnormal weight loss: Secondary | ICD-10-CM | POA: Diagnosis not present

## 2014-08-22 DIAGNOSIS — Z7901 Long term (current) use of anticoagulants: Secondary | ICD-10-CM | POA: Diagnosis not present

## 2014-08-23 DIAGNOSIS — Z7901 Long term (current) use of anticoagulants: Secondary | ICD-10-CM | POA: Diagnosis not present

## 2014-08-23 DIAGNOSIS — I482 Chronic atrial fibrillation: Secondary | ICD-10-CM | POA: Diagnosis not present

## 2014-08-23 DIAGNOSIS — R634 Abnormal weight loss: Secondary | ICD-10-CM | POA: Diagnosis not present

## 2014-08-23 DIAGNOSIS — E1165 Type 2 diabetes mellitus with hyperglycemia: Secondary | ICD-10-CM | POA: Diagnosis not present

## 2014-08-25 ENCOUNTER — Ambulatory Visit (HOSPITAL_COMMUNITY)
Admission: RE | Admit: 2014-08-25 | Discharge: 2014-08-25 | Disposition: A | Discharge status: Home / Self Care | Payer: Medicare Other | Source: Ambulatory Visit | Attending: Family Medicine | Admitting: Family Medicine

## 2014-08-25 DIAGNOSIS — I10 Essential (primary) hypertension: Secondary | ICD-10-CM | POA: Insufficient documentation

## 2014-08-25 DIAGNOSIS — R1084 Generalized abdominal pain: Secondary | ICD-10-CM | POA: Diagnosis not present

## 2014-08-25 DIAGNOSIS — R197 Diarrhea, unspecified: Secondary | ICD-10-CM | POA: Insufficient documentation

## 2014-08-25 DIAGNOSIS — R110 Nausea: Secondary | ICD-10-CM | POA: Insufficient documentation

## 2014-08-25 DIAGNOSIS — R188 Other ascites: Secondary | ICD-10-CM | POA: Diagnosis not present

## 2014-08-25 LAB — POCT I-STAT CREATININE: Creatinine, Ser: 0.7 mg/dL (ref 0.50–1.10)

## 2014-08-25 MED ORDER — IOHEXOL 300 MG/ML  SOLN
100.0000 mL | Freq: Once | INTRAMUSCULAR | Status: AC | PRN
Start: 2014-08-25 — End: 2014-08-25
  Administered 2014-08-25: 100 mL via INTRAVENOUS

## 2014-08-26 DIAGNOSIS — D509 Iron deficiency anemia, unspecified: Secondary | ICD-10-CM | POA: Diagnosis not present

## 2014-08-26 DIAGNOSIS — R188 Other ascites: Secondary | ICD-10-CM | POA: Diagnosis not present

## 2014-08-26 DIAGNOSIS — E1165 Type 2 diabetes mellitus with hyperglycemia: Secondary | ICD-10-CM | POA: Diagnosis not present

## 2014-08-26 DIAGNOSIS — I509 Heart failure, unspecified: Secondary | ICD-10-CM | POA: Diagnosis not present

## 2014-08-26 DIAGNOSIS — E039 Hypothyroidism, unspecified: Secondary | ICD-10-CM | POA: Diagnosis not present

## 2014-08-27 ENCOUNTER — Encounter: Payer: Self-pay | Admitting: Physician Assistant

## 2014-08-27 ENCOUNTER — Ambulatory Visit (INDEPENDENT_AMBULATORY_CARE_PROVIDER_SITE_OTHER): Payer: Medicare Other | Admitting: Physician Assistant

## 2014-08-27 ENCOUNTER — Encounter: Payer: Self-pay | Admitting: Cardiovascular Disease

## 2014-08-27 DIAGNOSIS — R188 Other ascites: Secondary | ICD-10-CM | POA: Diagnosis not present

## 2014-08-27 DIAGNOSIS — I10 Essential (primary) hypertension: Secondary | ICD-10-CM | POA: Diagnosis not present

## 2014-08-27 DIAGNOSIS — I5021 Acute systolic (congestive) heart failure: Secondary | ICD-10-CM

## 2014-08-27 MED ORDER — FUROSEMIDE 40 MG PO TABS: 40.0000 mg | ORAL_TABLET | Freq: Two times a day (BID) | ORAL | Status: DC

## 2014-08-27 MED ORDER — POTASSIUM CHLORIDE CRYS ER 20 MEQ PO TBCR: 20.0000 meq | EXTENDED_RELEASE_TABLET | Freq: Four times a day (QID) | ORAL | Status: DC

## 2014-08-27 NOTE — Assessment & Plan Note (Signed)
Patient has significant ascites and lower extremity edema with 30 pound weight gain. I discussed possible hospitalization with her but she would like to try outpatient treatment first. We will begin Lasix 40 mg twice a day increase her potassium to 20 mEq 4 times a day. If she does not improve in the next 2 days she is advised to go to the emergency room to be admitted for IV diuresis. 2 g sodium diet. Follow-up with Dr. Wyline Mood next week. We are trying to obtain blood work that was performed on her yesterday.

## 2014-08-27 NOTE — Patient Instructions (Addendum)
Your physician recommends that you schedule a follow-up appointment Next week with Dr. Wyline Mood  Your physician has recommended you make the following change in your medication:   Increase: Lasix to 40 mg two times Daily                  Potassium 20 meq four times Daily  You have been give a copy a Low Salt Diet  Your physician recommends that you return for lab work 2 days before your next visit.   Please go the the ED if you are not better by Friday  Thank you for choosing [facility]!

## 2014-08-27 NOTE — Assessment & Plan Note (Signed)
Patient has 30 pound weight gain with ascites and lower extremity edema. Will attempt outpatient diuresis, but advised patient she may need IV diuresis and admission to the hospital. Patient does not improve in the next day or so she is advised to come to the emergency room.

## 2014-08-27 NOTE — Assessment & Plan Note (Signed)
Patient is anemic with a hemoglobin of 8.5 recently. Some of this may be delusional. Patient will need repeat lab next week.

## 2014-08-27 NOTE — Progress Notes (Signed)
Cardiology Office Note   Date:  08/27/2014   ID:  [name], [name] [birth year], MRN [id number]  PCP:  [name], MD  Cardiologist:  Dina Rich, M.D.  Chief Complaint: Fluid buildup    History of Present Illness: [name] is a 64 y.o. female who presents for I will evaluation of ascites and edema found on CT scan of her abdomen 08/25/14. Patient has history of normal coronary arteries, mild pulmonary hypertension, mild depressed LV systolic function EF 40% on cardiac catheterization 01/10/14. The procedure did suggest the right atrium and right ventricle were enlarged as it was difficult manipulating the right heart catheter into the pulmonary artery. 2-D echo 12/2013 showed normal systolic function EF 50-55% with normal wall motion. It was not technically we sufficient to evaluate LV diastolic dysfunction due to atrial fibrillation. Aortic valve area Vmax was 2.11 cm, moderate MR left atrium was severely dilated right ventricle was mildly to moderately dilated right atrium was severely dilated there was moderate TR and systolic pressure was severely increased PA pressure was 85 mmHg.  Patient also has history of atrial fibrillation on Eliquis. She had a recent emergency room visit in January with a nosebleed. She was also anemic with a hemoglobin of 8.5 which has been stable by recent check by Dr. [name]. She's had no further nosebleeds or bleeding anywhere. She has type 2 diabetes mellitus, hypothyroidism on Synthroid which was increased by Dr. [name] for TSH of 13.58.  The patient comes in with three-week history of abdominal pain and swelling. She was seen by Dr. [name] who ordered a CT scan of her abdomen showing ascites. She takes Lasix on a when necessary basis for leg edema and hasn't taken it in a long time. Her weight is up approximately 30 pounds. Dyspnea on exertion but denies orthopnea, dizziness, chest pain or presyncope.    Past Medical History  Diagnosis  Date  . Chest pain     Negative cardiac catheterization in 2002; negative stress nuclear study in 2008  . Congestive heart failure     Normal LV systolic function  . PAF (paroxysmal atrial fibrillation) 2009  . Chronic anticoagulation   . Diabetes mellitus, type 2     Insulin therapy; exacerbated by prednisone  . Syncope     Admitted 05/2009; magnetic resonance imagin/ MRA - negative; etiology thought to be orthostasis secondary to drugs and dehydration  . GERD (gastroesophageal reflux disease)   . IBS (irritable bowel syndrome)   . Hiatal hernia   . Gastroparesis     99% retention 05/2008 on GES  . Hyperlipidemia   . Hypertension   . Hypothyroid   . Chronic LBP     Surgical intervention in 1996  . Obesity   . OSA on CPAP   . Anemia     H/H of 10/30 with a normal MCV in 12/09  . Barrett's esophagus     Diagnosed 1995. Last EGD 2008.   . Seizures     Past Surgical History  Procedure Laterality Date  . Cardiovascular stress test  2008    Stress nuclear study  . Back surgery  1996  . Laparoscopic cholecystectomy  1990s  . Total abdominal hysterectomy  1999  . Laminectomy  1995    L4-L5  . Carpal tunnel release  1994  . Esophagogastroduodenoscopy  2008    Barrett's without dysplasia. esphagus dilated. antral erosions, h.pylori serologies negative.  . Colonoscopy  11/2011    Dr. Darrick Penna: Internal hemorrhoids, mild  diverticulosis. Random colon biopsies negative.  . Givens capsule study  12/07/2011    Proximal small bowel, rare AVM. Distal small bowel, multiple ulcers noted  . Esophagogastroduodenoscopy  11/2011    Dr. Darrick Penna: Barrett's esophagus, mild gastritis, diverticulum in the second portion of the duodenum repeat EGD 3 years. Small bowel biopsies negative. Gastric biopsy show reactive gastropathy but no H. pylori. Esophageal biopsies consistent with GERD. Next EGD 11/2014  . Givens capsule study N/A 09/27/2013    Distal small bowel ulcers extending to TI.  Marland Kitchen Givens capsule  study N/A 10/10/2013    Procedure: GIVENS CAPSULE STUDY;  Surgeon: West Bali, MD;  Location: [facility];  Service: Endoscopy;  Laterality: N/A;  7:30  . Colonoscopy N/A 11/08/2013    SLF: Normal mucosa in the terminal ileum/The colon IS redundant/  Moderate diverticulosis throughout the entire colon. ileum bx benign. colon bx benign  . Esophageal biopsy N/A 11/08/2013    Procedure: BIOPSY  / Tissue sampling / ulcers present in small intestine;  Surgeon: West Bali, MD;  Location: [facility];  Service: Endoscopy;  Laterality: N/A;  . Cardiac catheterization  2002  . Right heart catheterization N/A 01/10/2014    Procedure: RIGHT HEART CATH;  Surgeon: Lesleigh Noe, MD;  Location: [facility] CATH LAB;  Service: Cardiovascular;  Laterality: N/A;  . Left heart catheterization with coronary angiogram  01/10/2014    Procedure: LEFT HEART CATHETERIZATION WITH CORONARY ANGIOGRAM;  Surgeon: Lesleigh Noe, MD;  Location: [facility] CATH LAB;  Service: Cardiovascular;;     Current Outpatient Prescriptions  Medication Sig Dispense Refill  . dronabinol (MARINOL) 2.5 MG capsule Take 1 capsule (2.5 mg total) by mouth 2 (two) times daily before a meal. 60 capsule 0  . DULoxetine (CYMBALTA) 30 MG capsule Take 30 mg by mouth 2 (two) times daily.    Marland Kitchen ELIQUIS 5 MG TABS tablet TAKE 1 TABLET BY MOUTH TWICE DAILY. 60 tablet 3  . feeding supplement, GLUCERNA SHAKE, (GLUCERNA SHAKE) LIQD Take 237 mLs by mouth 3 (three) times daily between meals.  0  . glipiZIDE (GLUCOTROL) 10 MG tablet Take 10 mg by mouth daily before breakfast.     . Insulin Glargine (LANTUS SOLOSTAR) 100 UNIT/ML Solostar Pen Inject 20-50 Units into the skin at bedtime as needed. Patient takes 20 units every night at bedtime if over 178.  Takes 50 units if her blood sugar is above 300.    . levETIRAcetam (KEPPRA) 250 MG tablet Take 1 tablet (250 mg total) by mouth 2 (two) times daily. 60 tablet 11  . levothyroxine (SYNTHROID, LEVOTHROID) 125 MCG  tablet Take 125 mcg by mouth daily.    Marland Kitchen loperamide (IMODIUM) 2 MG capsule TAKE (1) CAPSULE THREE TIMES DAILY AS NEEDED FOR DIARRHEA OR LOOSE STOOLS. 30 capsule 5  . LORazepam (ATIVAN) 1 MG tablet Take 1-2 mg by mouth daily as needed for anxiety or sleep. For anxiety    . metaxalone (SKELAXIN) 800 MG tablet Take 800 mg by mouth 3 (three) times daily.     . metFORMIN (GLUCOPHAGE) 500 MG tablet Take 1,000 mg by mouth 2 (two) times daily with a meal.     . metoprolol tartrate (LOPRESSOR) 25 MG tablet Take 1 tablet (25 mg total) by mouth 2 (two) times daily. 60 tablet 0  . Multiple Vitamin (MULTIVITAMIN WITH MINERALS) TABS tablet Take 1 tablet by mouth daily.    . nitroGLYCERIN (NITROSTAT) 0.4 MG SL tablet Place 0.4 mg under  the tongue every 5 (five) minutes as needed for chest pain. For chest pain     . omeprazole (PRILOSEC) 20 MG capsule Take 20 mg by mouth 2 (two) times daily.      . ondansetron (ZOFRAN) 4 MG tablet Take 4 mg by mouth every 6 (six) hours as needed. For nausea    . potassium chloride SA (K-DUR,KLOR-CON) 20 MEQ tablet Take 20 mEq by mouth 3 (three) times daily.     . vitamin B-12 (CYANOCOBALAMIN) 500 MCG tablet Take 500 mcg by mouth daily.     No current facility-administered medications for this visit.    Allergies:   Ibuprofen; Celexa; Codeine; and Reglan    Social History:  The patient  reports that she quit smoking about 31 years ago. She started smoking about 48 years ago. She has never used smokeless tobacco. She reports that she does not drink alcohol or use illicit drugs.   Family History:  The patient's family history includes Alzheimer's disease in her mother; Heart attack in her mother; Hypertension in her mother and other; Stroke in her mother. There is no history of Heart disease or Colon cancer.    ROS:  Please see the history of present illness.   Otherwise, review of systems are positive for none.   All other systems are reviewed and negative.    PHYSICAL  EXAM: VS:  BP 108/86 mmHg  Pulse 79  Ht  (1.676 m)  Wt 215 lb (97.523 kg)  BMI 34.72 kg/m2  SpO2 95% , BMI Body mass index is 34.72 kg/(m^2). GEN: Obese, well developed, in no acute distress HEENT: normal Neck: Increased JVD, HJR, no carotid bruits, or masses Cardiac: Irregular irregular; distant heart sounds 2/6 systolic murmur at the left sternal border, no rubs, thrill or heave, abdominal swelling and significant lower extremity edema  Respiratory:  Decreased breath sounds at the bases otherwise clear to auscultation bilaterally, normal work of breathing GI: Distended with ascites soft, nontender, + BS MS: no deformity or atrophy Extremities: +3-4 edema all the way up to her thighs otherwise without cyanosis, clubbing, decreased distal pulses bilaterally.  Skin: warm and dry, no rash Neuro:  Strength and sensation are intact Psych: euthymic mood, full affect   EKG:  EKG is ordered today. The ekg ordered today demonstrates atrial fibrillation at 96 bpm nonspecific ST-T wave changes, no acute change  Recent Labs: 04/04/2014: Pro B Natriuretic peptide (BNP) 1689.0* 07/20/2014: B Natriuretic Peptide 576.0*; TSH 13.130* 07/21/2014: ALT 43*; BUN 15; Hemoglobin 8.7*; Platelets 250; Potassium 4.3; Sodium 137 08/25/2014: Creatinine 0.70    Lipid Panel    Component Value Date/Time   CHOL 77 09/02/2011 0446   TRIG 88 09/02/2011 0446   HDL 35* 09/02/2011 0446   CHOLHDL 2.2 09/02/2011 0446   VLDL 18 09/02/2011 0446   LDLCALC 24 09/02/2011 0446      Wt Readings from Last 3 Encounters:  08/27/14 215 lb (97.523 kg)  07/20/14 218 lb 6.4 oz (99.066 kg)  06/04/14 211 lb 3.2 oz (95.8 kg)      Other studies Reviewed: Additional studies/ records that were reviewed today include and review of the records demonstrates:  Cardiac catheterization 01/2014 LEFT VENTRICULOGRAM:  Left ventricular angiogram was done in the 30 RAO projection and revealed normal LV cavity size, tachycardia due  to atrial fibrillation, and mild global reduction in LV function with an estimated ejection fraction of 40%   IMPRESSIONS:  1. Widely patent coronary arteries 2. Mild pulmonary hypertension  3. Mildly depressed LV systolic function with an estimated EF of 40% 3. This did not confirm significant pulmonary hypertension. The procedure did suggest that the right atrium and right ventricle were enlarged as we had difficulty manipulating the right heart catheter into the pulmonary artery.   RECOMMENDATION:  Back to the outpatient area at home later today if no complications. She should not resume eloquence until 24 hours post procedure..              2-D echo 12/2013: LEFT VENTRICULOGRAM:  Left ventricular angiogram was done in the 30 RAO projection and revealed normal LV cavity size, tachycardia due to atrial fibrillation, and mild global reduction in LV function with an estimated ejection fraction of 40%   IMPRESSIONS:  1. Widely patent coronary arteries 2. Mild pulmonary hypertension 3. Mildly depressed LV systolic function with an estimated EF of 40% 3. This did not confirm significant pulmonary hypertension. The procedure did suggest that the right atrium and right ventricle were enlarged as we had difficulty manipulating the right heart catheter into the pulmonary artery.   RECOMMENDATION:  Back to the outpatient area at home later today if no complications. She should not resume eloquence until 24 hours post procedure..              CT scan of the abdomen 08/25/14: IMPRESSION: Respiratory motion degraded examination.   Ascites. Third spacing of fluid. Etiology indeterminate but may be related to heart dysfunction. Prominent hepatic veins.   Cardiomegaly.  Mitral valve calcifications.   Small plural effusions with basilar atelectasis.   Atherosclerotic type changes aorta and branch vessels most notable involving the proximal renal artery bilaterally.   Evaluation for extra  luminal bowel inflammatory process is limited given the ascites. No extra luminal free air.   Liver is enlarged spanning over 21 cm.   These results will be called to the ordering clinician or representative by the Radiologist Assistant, and communication documented in the PACS or zVision Dashboard.     Electronically Signed   By: Lacy Duverney M.D.   On: 08/25/2014 09:35   Labs from Dr. Michelle Nasuti office reviewed hemoglobin A1c was 7.7 hemoglobin 8.5 TSH 13.58. Trying to obtain labs drawn yesterday including BNP but they were sent out to [facility] which is a 5 day turnaround time.   ASSESSMENT AND PLAN:  Ascites Patient has significant ascites and lower extremity edema with 30 pound weight gain. I discussed possible hospitalization with her but she would like to try outpatient treatment first. We will begin Lasix 40 mg twice a day increase her potassium to 20 mEq 4 times a day. If she does not improve in the next 2 days she is advised to go to the emergency room to be admitted for IV diuresis. 2 g sodium diet. Follow-up with Dr. Wyline Mood next week. We are trying to obtain blood work that was performed on her yesterday.   Acute CHF Patient has 30 pound weight gain with ascites and lower extremity edema. Will attempt outpatient diuresis, but advised patient she may need IV diuresis and admission to the hospital. Patient does not improve in the next day or so she is advised to come to the emergency room.   Atrial fibrillation Patient remains in atrial fibrillation with controlled rate on low-dose metoprolol. She is also on Eliquis 5 mg twice a day. Continue both. Recent nosebleed in January without recurrence.   Essential hypertension Blood pressure is on the low side but no dizziness or presyncope.  Anemia Patient is anemic with a hemoglobin of 8.5 recently. Some of this may be delusional. Patient will need repeat lab next week.     Elson Clan, PA-C  08/27/2014 10:30 AM     [facility] [address] [place], [place], [place]  [address] Phone: [phone]; Fax: [phone]

## 2014-08-27 NOTE — Assessment & Plan Note (Signed)
Blood pressure is on the low side but no dizziness or presyncope.

## 2014-08-27 NOTE — Assessment & Plan Note (Signed)
Patient remains in atrial fibrillation with controlled rate on low-dose metoprolol. She is also on Eliquis 5 mg twice a day. Continue both. Recent nosebleed in January without recurrence.

## 2014-09-01 ENCOUNTER — Ambulatory Visit: Payer: Medicare Other | Admitting: Gastroenterology

## 2014-09-08 DIAGNOSIS — I5021 Acute systolic (congestive) heart failure: Secondary | ICD-10-CM | POA: Diagnosis not present

## 2014-09-08 DIAGNOSIS — I509 Heart failure, unspecified: Secondary | ICD-10-CM | POA: Diagnosis not present

## 2014-09-08 DIAGNOSIS — I10 Essential (primary) hypertension: Secondary | ICD-10-CM | POA: Diagnosis not present

## 2014-09-08 LAB — CBC WITH DIFFERENTIAL/PLATELET
Basophils Absolute: 0.1 10*3/uL (ref 0.0–0.1)
Basophils Relative: 1 % (ref 0–1)
EOS ABS: 0.1 10*3/uL (ref 0.0–0.7)
Eosinophils Relative: 1 % (ref 0–5)
HCT: 28.3 % — ABNORMAL LOW (ref 36.0–46.0)
HEMOGLOBIN: 8.2 g/dL — AB (ref 12.0–15.0)
LYMPHS ABS: 1.9 10*3/uL (ref 0.7–4.0)
LYMPHS PCT: 34 % (ref 12–46)
MCH: 22.2 pg — ABNORMAL LOW (ref 26.0–34.0)
MCHC: 29 g/dL — ABNORMAL LOW (ref 30.0–36.0)
MCV: 76.7 fL — ABNORMAL LOW (ref 78.0–100.0)
MPV: 11.2 fL (ref 8.6–12.4)
Monocytes Absolute: 0.6 10*3/uL (ref 0.1–1.0)
Monocytes Relative: 11 % (ref 3–12)
NEUTROS PCT: 53 % (ref 43–77)
Neutro Abs: 3 10*3/uL (ref 1.7–7.7)
Platelets: 285 10*3/uL (ref 150–400)
RBC: 3.69 MIL/uL — AB (ref 3.87–5.11)
RDW: 18.7 % — ABNORMAL HIGH (ref 11.5–15.5)
WBC: 5.6 10*3/uL (ref 4.0–10.5)

## 2014-09-08 LAB — BASIC METABOLIC PANEL
BUN: 12 mg/dL (ref 6–23)
CO2: 26 mEq/L (ref 19–32)
Calcium: 9.1 mg/dL (ref 8.4–10.5)
Chloride: 105 mEq/L (ref 96–112)
Creat: 0.61 mg/dL (ref 0.50–1.10)
GLUCOSE: 137 mg/dL — AB (ref 70–99)
Potassium: 4.4 mEq/L (ref 3.5–5.3)
Sodium: 139 mEq/L (ref 135–145)

## 2014-09-08 LAB — BRAIN NATRIURETIC PEPTIDE: Brain Natriuretic Peptide: 385.8 pg/mL — ABNORMAL HIGH (ref 0.0–100.0)

## 2014-09-10 ENCOUNTER — Telehealth: Payer: Self-pay

## 2014-09-10 NOTE — Telephone Encounter (Signed)
-----   Message from Dyann Kief, PA-C sent at 09/10/2014  8:04 AM EST ----- Patient is still very anemic. Please fax labs to dr. [name]. Ask patient how much weight she has lost. Make sure she has f/u with Dr. Wyline Mood. Will need bmet next week.

## 2014-09-10 NOTE — Telephone Encounter (Signed)
Spoke with daughter will se Dr Wyline Mood this week

## 2014-09-12 ENCOUNTER — Ambulatory Visit (INDEPENDENT_AMBULATORY_CARE_PROVIDER_SITE_OTHER): Payer: Medicare Other | Admitting: Cardiology

## 2014-09-12 ENCOUNTER — Encounter: Payer: Self-pay | Admitting: Cardiology

## 2014-09-12 VITALS — BP 110/70 | HR 76 | Ht 66.0 in | Wt 194.6 lb

## 2014-09-12 DIAGNOSIS — I270 Primary pulmonary hypertension: Secondary | ICD-10-CM | POA: Diagnosis not present

## 2014-09-12 DIAGNOSIS — I5031 Acute diastolic (congestive) heart failure: Secondary | ICD-10-CM

## 2014-09-12 DIAGNOSIS — I272 Pulmonary hypertension, unspecified: Secondary | ICD-10-CM

## 2014-09-12 DIAGNOSIS — D649 Anemia, unspecified: Secondary | ICD-10-CM | POA: Diagnosis not present

## 2014-09-12 DIAGNOSIS — R188 Other ascites: Secondary | ICD-10-CM | POA: Diagnosis not present

## 2014-09-12 DIAGNOSIS — I482 Chronic atrial fibrillation: Secondary | ICD-10-CM | POA: Diagnosis not present

## 2014-09-12 DIAGNOSIS — E8779 Other fluid overload: Secondary | ICD-10-CM | POA: Diagnosis not present

## 2014-09-12 NOTE — Progress Notes (Signed)
Clinical Summary Ms. Birkhead is a 64 y.o.female seen today for follow up of the following medical problems. This is a focused visit on her history of severe edema.    1. Pulmonary Hypertension - echo 12/2013 PASP 85, moderate TR, RV mild to moderately dilated with normal function, could not eval diasotlic function due to afib, LVEF 50-55%. Biatrial enlargement.  - RHC PA 38/13 and calculated mean of 21, could not get a wedge however LVEDP 15. - LE edema somewhat improved since last visit  2. Acute diastolic CHF - seen 08/27/14 with severe volume overload, with 30 lbs weight gain. Recent CT scan showed ascites.   - weight last visit was 215 lbs (weight from discharge 04/2014 172 lbs).  - started on lasix 40mg  bid 08/27/14. Repeat labs shows down trend in Cr and BUN, stable K. BNP 385  - weights at home dow to 192 lbs. - limiting sodium intake.   3.  Hypothyroidism - TSH 13.130, family reports recent increase in thyroid medicine 2 weeks ago.   Past Medical History  Diagnosis Date  . Chest pain     Negative cardiac catheterization in 2002; negative stress nuclear study in 2008  . Congestive heart failure     Normal LV systolic function  . PAF (paroxysmal atrial fibrillation) 2009  . Chronic anticoagulation   . Diabetes mellitus, type 2     Insulin therapy; exacerbated by prednisone  . Syncope     Admitted 05/2009; magnetic resonance imagin/ MRA - negative; etiology thought to be orthostasis secondary to drugs and dehydration  . GERD (gastroesophageal reflux disease)   . IBS (irritable bowel syndrome)   . Hiatal hernia   . Gastroparesis     99% retention 05/2008 on GES  . Hyperlipidemia   . Hypertension   . Hypothyroid   . Chronic LBP     Surgical intervention in 1996  . Obesity   . OSA on CPAP   . Anemia     H/H of 10/30 with a normal MCV in 12/09  . Barrett's esophagus     Diagnosed 1995. Last EGD 2008.   . Seizures      Allergies  Allergen Reactions  .  Ibuprofen     Upsets her stomach and causes abdominal pain  . Celexa [Citalopram Hydrobromide] Other (See Comments)    Dyskinesia  . Codeine Nausea And Vomiting    HALLUCINATIONS  . Reglan [Metoclopramide] Other (See Comments)    DYSKINESIA     Current Outpatient Prescriptions  Medication Sig Dispense Refill  . dronabinol (MARINOL) 2.5 MG capsule Take 1 capsule (2.5 mg total) by mouth 2 (two) times daily before a meal. 60 capsule 0  . DULoxetine (CYMBALTA) 30 MG capsule Take 30 mg by mouth 2 (two) times daily.    Marland Kitchen ELIQUIS 5 MG TABS tablet TAKE 1 TABLET BY MOUTH TWICE DAILY. 60 tablet 3  . feeding supplement, GLUCERNA SHAKE, (GLUCERNA SHAKE) LIQD Take 237 mLs by mouth 3 (three) times daily between meals.  0  . furosemide (LASIX) 40 MG tablet Take 1 tablet (40 mg total) by mouth 2 (two) times daily. 180 tablet 3  . glipiZIDE (GLUCOTROL) 10 MG tablet Take 10 mg by mouth daily before breakfast.     . Insulin Glargine (LANTUS SOLOSTAR) 100 UNIT/ML Solostar Pen Inject 20-50 Units into the skin at bedtime as needed. Patient takes 20 units every night at bedtime if over 178.  Takes 50 units if her blood sugar  is above 300.    . levETIRAcetam (KEPPRA) 250 MG tablet Take 1 tablet (250 mg total) by mouth 2 (two) times daily. 60 tablet 11  . levothyroxine (SYNTHROID, LEVOTHROID) 125 MCG tablet Take 125 mcg by mouth daily.    Marland Kitchen loperamide (IMODIUM) 2 MG capsule TAKE (1) CAPSULE THREE TIMES DAILY AS NEEDED FOR DIARRHEA OR LOOSE STOOLS. 30 capsule 5  . LORazepam (ATIVAN) 1 MG tablet Take 1-2 mg by mouth daily as needed for anxiety or sleep. For anxiety    . metaxalone (SKELAXIN) 800 MG tablet Take 800 mg by mouth 3 (three) times daily.     . metFORMIN (GLUCOPHAGE) 500 MG tablet Take 1,000 mg by mouth 2 (two) times daily with a meal.     . metoprolol tartrate (LOPRESSOR) 25 MG tablet Take 1 tablet (25 mg total) by mouth 2 (two) times daily. 60 tablet 0  . Multiple Vitamin (MULTIVITAMIN WITH MINERALS)  TABS tablet Take 1 tablet by mouth daily.    . nitroGLYCERIN (NITROSTAT) 0.4 MG SL tablet Place 0.4 mg under the tongue every 5 (five) minutes as needed for chest pain. For chest pain     . omeprazole (PRILOSEC) 20 MG capsule Take 20 mg by mouth 2 (two) times daily.      . ondansetron (ZOFRAN) 4 MG tablet Take 4 mg by mouth every 6 (six) hours as needed. For nausea    . potassium chloride SA (K-DUR,KLOR-CON) 20 MEQ tablet Take 1 tablet (20 mEq total) by mouth 4 (four) times daily. 120 tablet 11  . vitamin B-12 (CYANOCOBALAMIN) 500 MCG tablet Take 500 mcg by mouth daily.     No current facility-administered medications for this visit.     Past Surgical History  Procedure Laterality Date  . Cardiovascular stress test  2008    Stress nuclear study  . Back surgery  1996  . Laparoscopic cholecystectomy  1990s  . Total abdominal hysterectomy  1999  . Laminectomy  1995    L4-L5  . Carpal tunnel release  1994  . Esophagogastroduodenoscopy  2008    Barrett's without dysplasia. esphagus dilated. antral erosions, h.pylori serologies negative.  . Colonoscopy  11/2011    Dr. Darrick Penna: Internal hemorrhoids, mild diverticulosis. Random colon biopsies negative.  . Givens capsule study  12/07/2011    Proximal small bowel, rare AVM. Distal small bowel, multiple ulcers noted  . Esophagogastroduodenoscopy  11/2011    Dr. Darrick Penna: Barrett's esophagus, mild gastritis, diverticulum in the second portion of the duodenum repeat EGD 3 years. Small bowel biopsies negative. Gastric biopsy show reactive gastropathy but no H. pylori. Esophageal biopsies consistent with GERD. Next EGD 11/2014  . Givens capsule study N/A 09/27/2013    Distal small bowel ulcers extending to TI.  Marland Kitchen Givens capsule study N/A 10/10/2013    Procedure: GIVENS CAPSULE STUDY;  Surgeon: West Bali, MD;  Location: [facility];  Service: Endoscopy;  Laterality: N/A;  7:30  . Colonoscopy N/A 11/08/2013    SLF: Normal mucosa in the terminal ileum/The  colon IS redundant/  Moderate diverticulosis throughout the entire colon. ileum bx benign. colon bx benign  . Esophageal biopsy N/A 11/08/2013    Procedure: BIOPSY  / Tissue sampling / ulcers present in small intestine;  Surgeon: West Bali, MD;  Location: [facility];  Service: Endoscopy;  Laterality: N/A;  . Cardiac catheterization  2002  . Right heart catheterization N/A 01/10/2014    Procedure: RIGHT HEART CATH;  Surgeon: Lyn Records III,  MD;  Location: [facility] CATH LAB;  Service: Cardiovascular;  Laterality: N/A;  . Left heart catheterization with coronary angiogram  01/10/2014    Procedure: LEFT HEART CATHETERIZATION WITH CORONARY ANGIOGRAM;  Surgeon: Lesleigh Noe, MD;  Location: [facility] CATH LAB;  Service: Cardiovascular;;     Allergies  Allergen Reactions  . Ibuprofen     Upsets her stomach and causes abdominal pain  . Celexa [Citalopram Hydrobromide] Other (See Comments)    Dyskinesia  . Codeine Nausea And Vomiting    HALLUCINATIONS  . Reglan [Metoclopramide] Other (See Comments)    DYSKINESIA      Family History  Problem Relation Age of Onset  . Hypertension Mother   . Alzheimer's disease Mother   . Stroke Mother   . Heart attack Mother   . Heart disease Neg Hx   . Hypertension Other   . Colon cancer Neg Hx      Social History Ms. Lagasse reports that she quit smoking about 31 years ago. She started smoking about 48 years ago. She has never used smokeless tobacco. Ms. Marando reports that she does not drink alcohol.   Review of Systems CONSTITUTIONAL: No weight loss, fever, chills, weakness or fatigue.  HEENT: Eyes: No visual loss, blurred vision, double vision or yellow sclerae.No hearing loss, sneezing, congestion, runny nose or sore throat.  SKIN: No rash or itching.  CARDIOVASCULAR: no chest pain, no palpitations RESPIRATORY: No  cough or sputum.  GASTROINTESTINAL: No anorexia, nausea, vomiting or diarrhea. No abdominal pain or blood.  GENITOURINARY: No  burning on urination, no polyuria NEUROLOGICAL: No headache, dizziness, syncope, paralysis, ataxia, numbness or tingling in the extremities. No change in bowel or bladder control.  MUSCULOSKELETAL: No muscle, back pain, joint pain or stiffness. +leg swelling LYMPHATICS: No enlarged nodes. No history of splenectomy.  PSYCHIATRIC: No history of depression or anxiety.  ENDOCRINOLOGIC: No reports of sweating, cold or heat intolerance. No polyuria or polydipsia.  Marland Kitchen   Physical Examination p 76 bp 110/70 Wt 194 lbs BMI 31 Gen: resting comfortably, no acute distress HEENT: no scleral icterus, pupils equal round and reactive, no palptable cervical adenopathy,  CV: RRR, no m/r/g, n JVD, no carotid bruits Resp: Clear to auscultation bilaterally GI: abdomen is soft, non-tender, non-distended, normal bowel sounds, no hepatosplenomegaly MSK: extremities are warm, no edema. 1+bilateral edema Skin: warm, no rash Neuro:  no focal deficits Psych: appropriate affect   Diagnostic Studies  12/2013 Echo Study Conclusions  - Limited study to evaluate LV systolic function. - Left ventricle: The cavity size was normal. Wall thickness was normal. Systolic function was normal. The estimated ejection fraction was in the range of 50% to 55%. Wall motion was normal; there were no regional wall motion abnormalities. The study was not technically sufficient to allow evaluation of LV diastolic dysfunction due to atrial fibrillation. - Aortic valve: Valve area (VTI): 2.49 cm^2. Valve area (Vmax): 2.11 cm^2. - Mitral valve: There was moderate regurgitation. The MR vena contracta is 0.4 cm. - Left atrium: The atrium was severely dilated. - Right ventricle: The cavity size was mildly to moderately dilated. - Right atrium: The atrium was severely dilated. - Atrial septum: From available images no evidence of ASD or PFO by 2D or color Doppler images. - Tricuspid valve: There was moderate  regurgitation. - Pulmonary arteries: Systolic pressure was severely increased. PA peak pressure: 85 mm Hg (S).  01/2014 RHC HEMODYNAMICS: Aortic pressure 123/73 mmHg; LV pressure 122/6 mmHg; LVEDP 15 mm mercury; RA  4 mmHg; RV 31/5; PA 38/13 mmHg; PCWP(mean) unable to wedge the balloon tip catheter; Cardiac Output by Fick 4.73 L per minute  ANGIOGRAPHIC DATA: The left main coronary artery is normal.  The left anterior descending artery is widely patent, traverses the LV apex, gives origin to 2 diagonals, and has no significant obstruction..  The left circumflex artery is widely patent and gives a single large obtuse marginal Jonnathan Birman that trifurcates on the lateral wall. No significant obstruction.  The right coronary artery is dominant, gives 3 left ventricular branches and a large bifurcating PDA. No obstructions noted.Marland Kitchen  LEFT VENTRICULOGRAM: Left ventricular angiogram was done in the 30 RAO projection and revealed normal LV cavity size, tachycardia due to atrial fibrillation, and mild global reduction in LV function with an estimated ejection fraction of 40%   IMPRESSIONS: 1. Widely patent coronary arteries 2. Mild pulmonary hypertension 3. Mildly depressed LV systolic function with an estimated EF of 40% 3. This did not confirm significant pulmonary hypertension. The procedure did suggest that the right atrium and right ventricle were enlarged as we had difficulty manipulating the right heart catheter into the pulmonary artery.   RECOMMENDATION: Back to the outpatient area at home later today if no complications. She should not resume eloquence until 24 hours post procedure..   Assessment and Plan  1. Pulmonary HTN - severe by echo, RHC did not show significantly elevated pressures. From notes technically unable to obtain PCWP, unclear if technical issues with rest of RHC. Unclear why such large discrepancy between echo and RHC findings - repeat echo once more euvolemic  2.  Acute diastolic HF - significant weight loss since last visit, still with evidence of continued volume overload. Continue lasix at current dosing  3. Hypothyroidism - may be playing a role in her severe weight gain and edema - recent increase in synthroid by pcp, continue to follow  F/u 2-3 weeks. Repeat CMET and Mg in 2 weeks.     Antoine Poche, M.D.

## 2014-09-12 NOTE — Patient Instructions (Signed)
Your physician recommends that you schedule a follow-up appointment in: 2-3 weeks with Dr. Wyline Mood   Your physician recommends that you return for lab work in: 2 week ( CMET, Mg)   Your physician recommends that you continue on your current medications as directed. Please refer to the Current Medication list given to you today.  Thank you for choosing [facility]!

## 2014-09-16 ENCOUNTER — Encounter: Payer: Self-pay | Admitting: Gastroenterology

## 2014-09-16 ENCOUNTER — Ambulatory Visit (INDEPENDENT_AMBULATORY_CARE_PROVIDER_SITE_OTHER): Payer: Medicare Other | Admitting: Gastroenterology

## 2014-09-16 VITALS — BP 115/84 | HR 88 | Temp 98.3°F | Ht 66.0 in | Wt 194.4 lb

## 2014-09-16 DIAGNOSIS — K3184 Gastroparesis: Secondary | ICD-10-CM

## 2014-09-16 DIAGNOSIS — K2270 Barrett's esophagus without dysplasia: Secondary | ICD-10-CM | POA: Diagnosis not present

## 2014-09-16 DIAGNOSIS — D649 Anemia, unspecified: Secondary | ICD-10-CM | POA: Diagnosis not present

## 2014-09-16 DIAGNOSIS — K529 Noninfective gastroenteritis and colitis, unspecified: Secondary | ICD-10-CM

## 2014-09-16 DIAGNOSIS — R188 Other ascites: Secondary | ICD-10-CM | POA: Diagnosis not present

## 2014-09-16 DIAGNOSIS — R109 Unspecified abdominal pain: Secondary | ICD-10-CM | POA: Diagnosis not present

## 2014-09-16 DIAGNOSIS — R1084 Generalized abdominal pain: Secondary | ICD-10-CM | POA: Insufficient documentation

## 2014-09-16 DIAGNOSIS — K589 Irritable bowel syndrome without diarrhea: Secondary | ICD-10-CM | POA: Diagnosis not present

## 2014-09-16 MED ORDER — RIFAXIMIN 550 MG PO TABS: 550.0000 mg | ORAL_TABLET | Freq: Three times a day (TID) | ORAL | Status: DC

## 2014-09-16 NOTE — Progress Notes (Signed)
Primary Care Physician:  [name], MD  Primary Gastroenterologist:  Jonette Eva, MD   Chief Complaint  Patient presents with  . Follow-up    HPI:  [name] is a 64 y.o. female here for follow-up of chronic abdominal pain, chronic diarrhea, gastroparesis, history of IDA and weight loss. Last seen in the office in December 2015 and was doing fairly well at that time. She is due for EGD this year for Barrett's surveillance.   March 2015, small bowel capsule endoscopy for history of small bowel ulcers. She was found to have distal small bowel ulcers extending to the terminal ileum. Colonoscopy May 2015 she had normal terminal ileum, moderate diverticulosis, no obvious source for erosions/diarrhea/anemia identified. Ileal biopsies were negative. Colon biopsies negative. CT abdomen and pelvis with contrast of March 2015 for weight loss with no explanation.   Vitals - 1 value per visit 09/16/2014 09/12/2014 08/27/2014 07/21/2014  Weight (lb) 194.4 194.6 215    Vitals - 1 value per visit 07/20/2014 06/04/2014 04/12/2014 04/11/2014  Weight (lb) 218.4 211.2 172.18    Vitals - 1 value per visit 04/05/2014 04/04/2014 03/31/2014  Weight (lb)  161.06 169.4   Patient reports she developed a "new" abdominal pain associated with abdominal swelling which began around one month or so ago. She has associated third spacing. Weight went up significantly. Dr. [name] ordered a CT of the abdomen pelvis with contrast on 08/25/2014 showed enlarged liver, fatty containing anterior abdominal wall hernia, ascites/third spacing of fluid with etiology indeterminate but possibly related to heart dysfunction, prominent hepatic veins. Cardiomegaly. Worsening anemia since 07/2014 with Hgb in 8 range compared to 11 range in 04/2014. She has had some recurrent epistaxis back in 07/2014. Couple of episodes she was able to stop on her own but ended up having to be hospitalized briefly for episode back in 07/2014. ENT cauterized  and no further bleeding since then. One month abdominal swelling associated with abdominal cramping. Some association with bm. +nocturnal symptoms. Some vomiting. zofran prn. Tried to back down on marinol due to cost but had to go back to twice per day. Eating better. No heartburn. BM very loose. About 3-4 per day. This is her baseline. She takes Loperamide once per day at minimum. Wants to try Xifaxan for IBS-D. No melena, brbpr.     Current Outpatient Prescriptions  Medication Sig Dispense Refill  . dronabinol (MARINOL) 2.5 MG capsule Take 1 capsule (2.5 mg total) by mouth 2 (two) times daily before a meal. 60 capsule 0  . DULoxetine (CYMBALTA) 30 MG capsule Take 30 mg by mouth 2 (two) times daily.    Marland Kitchen ELIQUIS 5 MG TABS tablet TAKE 1 TABLET BY MOUTH TWICE DAILY. 60 tablet 3  . ferrous sulfate 325 (65 FE) MG tablet Take 325 mg by mouth daily with breakfast.    . furosemide (LASIX) 40 MG tablet Take 1 tablet (40 mg total) by mouth 2 (two) times daily. 180 tablet 3  . glipiZIDE (GLUCOTROL) 10 MG tablet Take 10 mg by mouth daily before breakfast.     . Insulin Glargine (LANTUS SOLOSTAR) 100 UNIT/ML Solostar Pen Inject 20-50 Units into the skin at bedtime as needed. Patient takes 20 units every night at bedtime if over 178.  Takes 50 units if her blood sugar is above 300.    . levETIRAcetam (KEPPRA) 250 MG tablet Take 1 tablet (250 mg total) by mouth 2 (two) times daily. 60 tablet 11  . levothyroxine (SYNTHROID, LEVOTHROID) 125 MCG  tablet Take 125 mcg by mouth daily.    Marland Kitchen loperamide (IMODIUM) 2 MG capsule TAKE (1) CAPSULE THREE TIMES DAILY AS NEEDED FOR DIARRHEA OR LOOSE STOOLS. 30 capsule 5  . LORazepam (ATIVAN) 1 MG tablet Take 1-2 mg by mouth daily as needed for anxiety or sleep. For anxiety    . metFORMIN (GLUCOPHAGE) 500 MG tablet Take 1,000 mg by mouth 2 (two) times daily with a meal.     . metoprolol tartrate (LOPRESSOR) 25 MG tablet Take 1 tablet (25 mg total) by mouth 2 (two) times daily. 60  tablet 0  . Multiple Vitamin (MULTIVITAMIN WITH MINERALS) TABS tablet Take 1 tablet by mouth daily.    . nitroGLYCERIN (NITROSTAT) 0.4 MG SL tablet Place 0.4 mg under the tongue every 5 (five) minutes as needed for chest pain. For chest pain     . omeprazole (PRILOSEC) 20 MG capsule Take 20 mg by mouth 2 (two) times daily.      . ondansetron (ZOFRAN) 4 MG tablet Take 4 mg by mouth every 6 (six) hours as needed. For nausea    . potassium chloride SA (K-DUR,KLOR-CON) 20 MEQ tablet Take 1 tablet (20 mEq total) by mouth 4 (four) times daily. 120 tablet 11  . vitamin B-12 (CYANOCOBALAMIN) 500 MCG tablet Take 500 mcg by mouth daily.     No current facility-administered medications for this visit.    Allergies as of 09/16/2014 - Review Complete 09/16/2014  Allergen Reaction Noted  . Ibuprofen  10/13/2013  . Celexa [citalopram hydrobromide] Other (See Comments) 06/30/2013  . Codeine Nausea And Vomiting   . Reglan [metoclopramide] Other (See Comments) 06/04/2014    Past Medical History  Diagnosis Date  . Chest pain     Negative cardiac catheterization in 2002; negative stress nuclear study in 2008  . Congestive heart failure     Normal LV systolic function  . PAF (paroxysmal atrial fibrillation) 2009  . Chronic anticoagulation   . Diabetes mellitus, type 2     Insulin therapy; exacerbated by prednisone  . Syncope     Admitted 05/2009; magnetic resonance imagin/ MRA - negative; etiology thought to be orthostasis secondary to drugs and dehydration  . GERD (gastroesophageal reflux disease)   . IBS (irritable bowel syndrome)   . Hiatal hernia   . Gastroparesis     99% retention 05/2008 on GES  . Hyperlipidemia   . Hypertension   . Hypothyroid   . Chronic LBP     Surgical intervention in 1996  . Obesity   . OSA on CPAP   . Anemia     H/H of 10/30 with a normal MCV in 12/09  . Barrett's esophagus     Diagnosed 1995. Last EGD 2008.   . Seizures     Past Surgical History  Procedure  Laterality Date  . Cardiovascular stress test  2008    Stress nuclear study  . Back surgery  1996  . Laparoscopic cholecystectomy  1990s  . Total abdominal hysterectomy  1999  . Laminectomy  1995    L4-L5  . Carpal tunnel release  1994  . Esophagogastroduodenoscopy  2008    Barrett's without dysplasia. esphagus dilated. antral erosions, h.pylori serologies negative.  . Colonoscopy  11/2011    Dr. Darrick Penna: Internal hemorrhoids, mild diverticulosis. Random colon biopsies negative.  . Givens capsule study  12/07/2011    Proximal small bowel, rare AVM. Distal small bowel, multiple ulcers noted  . Esophagogastroduodenoscopy  11/2011    Dr. Darrick Penna:  Barrett's esophagus, mild gastritis, diverticulum in the second portion of the duodenum repeat EGD 3 years. Small bowel biopsies negative. Gastric biopsy show reactive gastropathy but no H. pylori. Esophageal biopsies consistent with GERD. Next EGD 11/2014  . Givens capsule study N/A 09/27/2013    Distal small bowel ulcers extending to TI.  Marland Kitchen Givens capsule study N/A 10/10/2013    Procedure: GIVENS CAPSULE STUDY;  Surgeon: West Bali, MD;  Location: [facility];  Service: Endoscopy;  Laterality: N/A;  7:30  . Colonoscopy N/A 11/08/2013    SLF: Normal mucosa in the terminal ileum/The colon IS redundant/  Moderate diverticulosis throughout the entire colon. ileum bx benign. colon bx benign  . Esophageal biopsy N/A 11/08/2013    Procedure: BIOPSY  / Tissue sampling / ulcers present in small intestine;  Surgeon: West Bali, MD;  Location: [facility];  Service: Endoscopy;  Laterality: N/A;  . Cardiac catheterization  2002  . Right heart catheterization N/A 01/10/2014    Procedure: RIGHT HEART CATH;  Surgeon: Lesleigh Noe, MD;  Location: [facility] CATH LAB;  Service: Cardiovascular;  Laterality: N/A;  . Left heart catheterization with coronary angiogram  01/10/2014    Procedure: LEFT HEART CATHETERIZATION WITH CORONARY ANGIOGRAM;  Surgeon: Lesleigh Noe,  MD;  Location: [facility] CATH LAB;  Service: Cardiovascular;;    Family History  Problem Relation Age of Onset  . Hypertension Mother   . Alzheimer's disease Mother   . Stroke Mother   . Heart attack Mother   . Heart disease Neg Hx   . Hypertension Other   . Colon cancer Neg Hx     History   Social History  . Marital Status: Married    Spouse Name: N/A  . Number of Children: N/A  . Years of Education: N/A   Occupational History  . 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     Social History Main Topics  . Smoking status: Former Smoker -- 15 years    Start date: 02/26/1966    Quit date: 07/01/1983  . Smokeless tobacco: Never Used     Comment: quit in 1984  . Alcohol Use: No     Comment: last etoh one year ago, never frequent  . Drug Use: No  . Sexual Activity: Yes    Birth Control/ Protection: None, Surgical   Other Topics Concern  . Not on file   Social History Narrative   Sedentary   4 children, "blended family"      ROS:  General: Negative for anorexia, unintentional weight loss, fever, chills.+ fatigue, weakness. Eyes: Negative for vision changes.  ENT: Negative for hoarseness, difficulty swallowing , nasal congestion. CV: Negative for chest pain, angina, palpitations, dyspnea on exertion.+ peripheral edema.  Respiratory: Negative for dyspnea at rest, dyspnea on exertion, cough, sputum, wheezing.  GI: See history of present illness. GU:  Negative for dysuria, hematuria, urinary incontinence, urinary frequency, nocturnal urination.  MS: Negative for joint pain, low back pain.  Derm: Negative for rash or itching.  Neuro: Negative for weakness, abnormal sensation, seizure, frequent headaches, memory loss, confusion.  Psych: Negative for anxiety, depression, suicidal ideation, hallucinations.  Endo: Negative for unusual weight change.  Heme: Negative for bruising or bleeding. Allergy: Negative for rash or hives.    Physical Examination:  BP 115/84 mmHg   Pulse 88  Temp(Src) 98.3 F (36.8 C) (Oral)  Ht 5\' 6"  (1.676 m)  Wt 194 lb 6.4 oz (88.179  kg)  BMI 31.39 kg/m2   General: appears older than states age, in no acute distress.  Head: Normocephalic, atraumatic.   Eyes: Conjunctiva pink, no icterus. Mouth: Oropharyngeal mucosa moist and pink , no lesions erythema or exudate. Neck: Supple without thyromegaly, masses, or lymphadenopathy.  Lungs: Clear to auscultation bilaterally.  Heart: Regular rate and rhythm, no murmurs rubs or gallops.  Abdomen: Bowel sounds are normal, nontender, nondistended, +hepatomegaly, no splenomegaly or masses, no abdominal bruits or    hernia , no rebound or guarding.   Rectal:not performed Extremities: trace to 1+ lower extremity edema. No clubbing. Left lower leg deformity noted at calf from previous fracture.  Neuro: Alert and oriented x 4 , grossly normal neurologically.  Skin: Warm and dry, no rash or jaundice.   Psych: Alert and cooperative, normal mood and affect.  Labs: Lab Results  Component Value Date   WBC 5.6 09/08/2014   HGB 8.2* 09/08/2014   HCT 28.3* 09/08/2014   MCV 76.7* 09/08/2014   PLT 285 09/08/2014   Lab Results  Component Value Date   CREATININE 0.61 09/08/2014   BUN 12 09/08/2014   NA 139 09/08/2014   K 4.4 09/08/2014   CL 105 09/08/2014   CO2 26 09/08/2014   Lab Results  Component Value Date   ALT 43* 07/21/2014   AST 25 07/21/2014   ALKPHOS 48 07/21/2014   BILITOT 0.8 07/21/2014      Imaging Studies: Ct Abdomen Pelvis W Contrast  08/25/2014   CLINICAL DATA:  64 year old diabetic hypertensive female with generalized abdominal pain and swelling for the past month. Nausea, diarrhea and constipation. On prior CT, history of hysterectomy, cholecystectomy and appendectomy. History of small-bowel ulcers. Initial encounter.  EXAM: CT ABDOMEN AND PELVIS WITH CONTRAST  TECHNIQUE: Multidetector CT imaging of the abdomen and pelvis was performed using the standard protocol  following bolus administration of intravenous contrast.  CONTRAST:  OMNIPAQUE IOHEXOL 300 MG/ML  SOLN  COMPARISON:  09/03/2013 CT.  FINDINGS: Respiratory motion degraded examination.  Ascites. Third spacing of fluid. Etiology indeterminate but may be related to heart dysfunction. Prominent hepatic veins.  Cardiomegaly.  Mitral valve calcifications.  Small plural effusions with basilar atelectasis.  Atherosclerotic type changes aorta and branch vessels most notable involving the proximal renal artery bilaterally.  Evaluation for extra luminal bowel inflammatory process is limited given the ascites. No extra luminal free air. Under distended stomach without obvious primary bowel abnormality.  Liver is enlarged spanning over 21 cm. No focal worrisome hepatic lesion. Post cholecystectomy.  No worrisome splenic, pancreatic, renal or adrenal abnormality. Post cholecystectomy.  No adenopathy.  Slightly rounded left external iliac node unchanged.  Congenital fusion L2-3. Degenerative changes lower thoracic spine and lumbar spine and most notable L4-5 and L5-S1 level. No osseous destructive lesion.  Post hysterectomy.  No adnexal mass identified.  Fatty containing anterior abdominal wall hernia.  IMPRESSION: Respiratory motion degraded examination.  Ascites. Third spacing of fluid. Etiology indeterminate but may be related to heart dysfunction. Prominent hepatic veins.  Cardiomegaly.  Mitral valve calcifications.  Small plural effusions with basilar atelectasis.  Atherosclerotic type changes aorta and branch vessels most notable involving the proximal renal artery bilaterally.  Evaluation for extra luminal bowel inflammatory process is limited given the ascites. No extra luminal free air.  Liver is enlarged spanning over 21 cm.  These results will be called to the ordering clinician or representative by the Radiologist Assistant, and communication documented in the PACS or zVision Dashboard.  Electronically Signed    By: Lacy Duverney M.D.   On: 08/25/2014 09:35

## 2014-09-16 NOTE — Patient Instructions (Signed)
1. Looks like Xifaxan may not be covered by [employer]. We will see if we can get a prior authorization for it. 2. Please take our labs when you go next week.  3. I will discuss your case further with Dr. Darrick Penna with regards to abdominal pain and ascites (abdominal fluid). Please let me know if you have any questions or concerns.

## 2014-09-21 NOTE — Assessment & Plan Note (Signed)
New abd pain for four weeks associated with new-onset ascites of unclear etiology, ?related to cardiac etiology. Hepatomegaly noted on recent CT. Abdominal pain worse with BM. To discuss further with Dr. Darrick Penna with regards to pain, new-onset ascites. Further recommendations to follow.

## 2014-09-21 NOTE — Assessment & Plan Note (Signed)
Due repeat EGD this year. Patient not ready to schedule at this time. Continue PPI.

## 2014-09-21 NOTE — Assessment & Plan Note (Signed)
Stable on marinol.

## 2014-09-21 NOTE — Assessment & Plan Note (Signed)
Likely multifactorial. Currently stable. Patient request trial of Xifaxan. We will see if insurance will approve. Continue imodium for now.

## 2014-09-21 NOTE — Assessment & Plan Note (Signed)
Labs planned for next week. Change in anemia in part due to recurrent epistaxis. Last TCS and small givens capsule in 2015. Last EGD 2013. Await labs.

## 2014-09-22 ENCOUNTER — Ambulatory Visit: Payer: Self-pay | Admitting: *Deleted

## 2014-09-22 DIAGNOSIS — Z5181 Encounter for therapeutic drug level monitoring: Secondary | ICD-10-CM

## 2014-09-22 DIAGNOSIS — I4891 Unspecified atrial fibrillation: Secondary | ICD-10-CM

## 2014-09-23 NOTE — Progress Notes (Signed)
cc'ed to pcp °

## 2014-10-04 DIAGNOSIS — E1165 Type 2 diabetes mellitus with hyperglycemia: Secondary | ICD-10-CM | POA: Diagnosis not present

## 2014-10-04 DIAGNOSIS — I482 Chronic atrial fibrillation: Secondary | ICD-10-CM | POA: Diagnosis not present

## 2014-10-04 DIAGNOSIS — R634 Abnormal weight loss: Secondary | ICD-10-CM | POA: Diagnosis not present

## 2014-10-04 DIAGNOSIS — R109 Unspecified abdominal pain: Secondary | ICD-10-CM | POA: Diagnosis not present

## 2014-10-04 DIAGNOSIS — R188 Other ascites: Secondary | ICD-10-CM | POA: Diagnosis not present

## 2014-10-04 DIAGNOSIS — R197 Diarrhea, unspecified: Secondary | ICD-10-CM | POA: Diagnosis not present

## 2014-10-04 DIAGNOSIS — K2270 Barrett's esophagus without dysplasia: Secondary | ICD-10-CM | POA: Diagnosis not present

## 2014-10-04 DIAGNOSIS — D649 Anemia, unspecified: Secondary | ICD-10-CM | POA: Diagnosis not present

## 2014-10-04 DIAGNOSIS — K589 Irritable bowel syndrome without diarrhea: Secondary | ICD-10-CM | POA: Diagnosis not present

## 2014-10-04 DIAGNOSIS — I10 Essential (primary) hypertension: Secondary | ICD-10-CM | POA: Diagnosis not present

## 2014-10-04 DIAGNOSIS — K529 Noninfective gastroenteritis and colitis, unspecified: Secondary | ICD-10-CM | POA: Diagnosis not present

## 2014-10-04 DIAGNOSIS — K3184 Gastroparesis: Secondary | ICD-10-CM | POA: Diagnosis not present

## 2014-10-04 LAB — COMPREHENSIVE METABOLIC PANEL
ALK PHOS: 50 U/L (ref 39–117)
ALT: <8 U/L (ref 0–35)
AST: 10 U/L (ref 0–37)
Albumin: 4.1 g/dL (ref 3.5–5.2)
BUN: 17 mg/dL (ref 6–23)
CO2: 21 meq/L (ref 19–32)
Calcium: 9.4 mg/dL (ref 8.4–10.5)
Chloride: 105 mEq/L (ref 96–112)
Creat: 0.68 mg/dL (ref 0.50–1.10)
Glucose, Bld: 201 mg/dL — ABNORMAL HIGH (ref 70–99)
Potassium: 4.9 mEq/L (ref 3.5–5.3)
SODIUM: 138 meq/L (ref 135–145)
Total Bilirubin: 0.7 mg/dL (ref 0.2–1.2)
Total Protein: 7 g/dL (ref 6.0–8.3)

## 2014-10-04 LAB — IRON AND TIBC
%SAT: 8 % — ABNORMAL LOW (ref 20–55)
IRON: 32 ug/dL — AB (ref 42–145)
TIBC: 410 ug/dL (ref 250–470)
UIBC: 378 ug/dL (ref 125–400)

## 2014-10-04 LAB — MAGNESIUM: MAGNESIUM: 1.4 mg/dL — AB (ref 1.5–2.5)

## 2014-10-05 LAB — FERRITIN: Ferritin: 10 ng/mL (ref 10–291)

## 2014-10-06 MED ORDER — MAGNESIUM OXIDE 400 MG PO TABS: 400.0000 mg | ORAL_TABLET | Freq: Every day | ORAL | Status: DC

## 2014-10-06 NOTE — Addendum Note (Signed)
Addended by: Kerney Elbe on: 10/06/2014 04:53 PM   Modules accepted: Orders

## 2014-10-07 ENCOUNTER — Telehealth: Payer: Self-pay

## 2014-10-07 NOTE — Telephone Encounter (Signed)
-----   Message from Antoine Poche, MD sent at 10/06/2014  9:50 AM EDT ----- Magnesium is low. Please start magnesium oxide 400mg  bid x 3 days, then take 400mg  daily.   Dominga Ferry MD

## 2014-10-07 NOTE — Progress Notes (Signed)
REVIEWED. HEPATOMEGALY NOT CLINICALLY SIGNIFICANT IN SETTING OF NL HFP. ANEMIA MOST LIKELY DUE TO EPISTAXIS. ABDOMINAL PAIN MOST LIKELY DUE TO FLUID OVERLOAD/ASCITES/IBS. ASCITES MOST LIKELY DUE TO R HEART FAILURE. WOULD PERFORM DIAGNOSTIC PARACENTESIS FOR CYTOLOGY, & ALBUMIN TO CALCULATE SAAG. NEEDS HFP SAME DAY.

## 2014-10-07 NOTE — Telephone Encounter (Signed)
Spoke with daughter Denise,will start magnesium for mother as directed

## 2014-10-10 ENCOUNTER — Encounter: Payer: Self-pay | Admitting: Cardiology

## 2014-10-10 ENCOUNTER — Ambulatory Visit (INDEPENDENT_AMBULATORY_CARE_PROVIDER_SITE_OTHER): Payer: Medicare Other | Admitting: Cardiology

## 2014-10-10 VITALS — BP 104/70 | HR 61 | Ht 66.0 in | Wt 188.0 lb

## 2014-10-10 DIAGNOSIS — I5032 Chronic diastolic (congestive) heart failure: Secondary | ICD-10-CM

## 2014-10-10 DIAGNOSIS — I270 Primary pulmonary hypertension: Secondary | ICD-10-CM | POA: Diagnosis not present

## 2014-10-10 DIAGNOSIS — I272 Pulmonary hypertension, unspecified: Secondary | ICD-10-CM

## 2014-10-10 NOTE — Patient Instructions (Signed)
Your physician recommends that you schedule a follow-up appointment in: 2 months with Dr Wyline Mood  Your physician recommends that you continue on your current medications as directed. Please refer to the Current Medication list given to you today.     Your physician has requested that you have an echocardiogram. Echocardiography is a painless test that uses sound waves to create images of your heart. It provides your doctor with information about the size and shape of your heart and how well your heart's chambers and valves are working. This procedure takes approximately one hour. There are no restrictions for this procedure.     Thank you for choosing [facility] !

## 2014-10-10 NOTE — Progress Notes (Addendum)
Clinical Summary [name] is a 64 y.o.female seen today for follow up of the following medical problems.   1. Chronic  diastolic CHF - seen 08/27/14 with severe volume overload, with 30 lbs weight gain.  - weight has been trending dow with diuretics. Last visit down to 194 lbs, today down to 188 lbs. Swelling improving, still with some abdominal swelling.   - she cut back to lasix  daily. Felt weak on bid dosing.  - limiting salt intake.    2. Pulmonary Hypertension - echo 12/2013 PASP 85, moderate TR, RV mild to moderately dilated with normal function, could not eval diasotlic function due to afib, LVEF 50-55%. Biatrial enlargement.  - RHC PA 38/13 and calculated mean of 21, could not get a wedge however LVEDP 15. - weight loss as described above with diuretics.    3. Hypothyroidism - followed by pcp  4. Afib - no palpitations - compliant with metoprolol and eliquis   Past Medical History  Diagnosis Date  . Chest pain     Negative cardiac catheterization in 2002; negative stress nuclear study in 2008  . Congestive heart failure     Normal LV systolic function  . PAF (paroxysmal atrial fibrillation) 2009  . Chronic anticoagulation   . Diabetes mellitus, type 2     Insulin therapy; exacerbated by prednisone  . Syncope     Admitted 05/2009; magnetic resonance imagin/ MRA - negative; etiology thought to be orthostasis secondary to drugs and dehydration  . GERD (gastroesophageal reflux disease)   . IBS (irritable bowel syndrome)   . Hiatal hernia   . Gastroparesis     99% retention 05/2008 on GES  . Hyperlipidemia   . Hypertension   . Hypothyroid   . Chronic LBP     Surgical intervention in 1996  . Obesity   . OSA on CPAP   . Anemia     H/H of 10/30 with a normal MCV in 12/09  . Barrett's esophagus     Diagnosed 1995. Last EGD 2008.   . Seizures      Allergies  Allergen Reactions  . Ibuprofen     Upsets her stomach and causes abdominal pain  .  Celexa [Citalopram Hydrobromide] Other (See Comments)    Dyskinesia  . Codeine Nausea And Vomiting    HALLUCINATIONS  . Reglan [Metoclopramide] Other (See Comments)    DYSKINESIA     Current Outpatient Prescriptions  Medication Sig Dispense Refill  . dronabinol (MARINOL) 2.5 MG capsule Take 1 capsule (2.5 mg total) by mouth 2 (two) times daily before a meal. 60 capsule 0  . DULoxetine (CYMBALTA) 30 MG capsule Take 30 mg by mouth 2 (two) times daily.    Marland Kitchen ELIQUIS 5 MG TABS tablet TAKE 1 TABLET BY MOUTH TWICE DAILY. 60 tablet 3  . ferrous sulfate 325 (65 FE) MG tablet Take 325 mg by mouth daily with breakfast.    . furosemide (LASIX) 40 MG tablet Take 1 tablet (40 mg total) by mouth 2 (two) times daily. 180 tablet 3  . glipiZIDE (GLUCOTROL) 10 MG tablet Take 10 mg by mouth daily before breakfast.     . Insulin Glargine (LANTUS SOLOSTAR) 100 UNIT/ML Solostar Pen Inject 20-50 Units into the skin at bedtime as needed. Patient takes 20 units every night at bedtime if over 178.  Takes 50 units if her blood sugar is above 300.    . levETIRAcetam (KEPPRA) 250 MG tablet Take 1 tablet (250  mg total) by mouth 2 (two) times daily. 60 tablet 11  . levothyroxine (SYNTHROID, LEVOTHROID) 125 MCG tablet Take 125 mcg by mouth daily.    Marland Kitchen loperamide (IMODIUM) 2 MG capsule TAKE (1) CAPSULE THREE TIMES DAILY AS NEEDED FOR DIARRHEA OR LOOSE STOOLS. 30 capsule 5  . LORazepam (ATIVAN) 1 MG tablet Take 1-2 mg by mouth daily as needed for anxiety or sleep. For anxiety    . magnesium oxide (MAG-OX) 400 MG tablet Take 1 tablet (400 mg total) by mouth daily. 30 tablet 6  . metFORMIN (GLUCOPHAGE) 500 MG tablet Take 1,000 mg by mouth 2 (two) times daily with a meal.     . metoprolol tartrate (LOPRESSOR) 25 MG tablet Take 1 tablet (25 mg total) by mouth 2 (two) times daily. 60 tablet 0  . Multiple Vitamin (MULTIVITAMIN WITH MINERALS) TABS tablet Take 1 tablet by mouth daily.    . nitroGLYCERIN (NITROSTAT) 0.4 MG SL tablet  Place 0.4 mg under the tongue every 5 (five) minutes as needed for chest pain. For chest pain     . omeprazole (PRILOSEC) 20 MG capsule Take 20 mg by mouth 2 (two) times daily.      . ondansetron (ZOFRAN) 4 MG tablet Take 4 mg by mouth every 6 (six) hours as needed. For nausea    . potassium chloride SA (K-DUR,KLOR-CON) 20 MEQ tablet Take 1 tablet (20 mEq total) by mouth 4 (four) times daily. 120 tablet 11  . rifaximin (XIFAXAN) 550 MG TABS tablet Take 1 tablet (550 mg total) by mouth 3 (three) times daily. 42 tablet 0  . vitamin B-12 (CYANOCOBALAMIN) 500 MCG tablet Take 500 mcg by mouth daily.     No current facility-administered medications for this visit.     Past Surgical History  Procedure Laterality Date  . Cardiovascular stress test  2008    Stress nuclear study  . Back surgery  1996  . Laparoscopic cholecystectomy  1990s  . Total abdominal hysterectomy  1999  . Laminectomy  1995    L4-L5  . Carpal tunnel release  1994  . Esophagogastroduodenoscopy  2008    Barrett's without dysplasia. esphagus dilated. antral erosions, h.pylori serologies negative.  . Colonoscopy  11/2011    Dr. Darrick Penna: Internal hemorrhoids, mild diverticulosis. Random colon biopsies negative.  . Givens capsule study  12/07/2011    Proximal small bowel, rare AVM. Distal small bowel, multiple ulcers noted  . Esophagogastroduodenoscopy  11/2011    Dr. Darrick Penna: Barrett's esophagus, mild gastritis, diverticulum in the second portion of the duodenum repeat EGD 3 years. Small bowel biopsies negative. Gastric biopsy show reactive gastropathy but no H. pylori. Esophageal biopsies consistent with GERD. Next EGD 11/2014  . Givens capsule study N/A 09/27/2013    Distal small bowel ulcers extending to TI.  Marland Kitchen Givens capsule study N/A 10/10/2013    Procedure: GIVENS CAPSULE STUDY;  Surgeon: West Bali, MD;  Location: [facility];  Service: Endoscopy;  Laterality: N/A;  7:30  . Colonoscopy N/A 11/08/2013    [facility]: Normal mucosa  in the terminal ileum/The colon IS redundant/  Moderate diverticulosis throughout the entire colon. ileum bx benign. colon bx benign  . Esophageal biopsy N/A 11/08/2013    Procedure: BIOPSY  / Tissue sampling / ulcers present in small intestine;  Surgeon: West Bali, MD;  Location: [facility];  Service: Endoscopy;  Laterality: N/A;  . Cardiac catheterization  2002  . Right heart catheterization N/A 01/10/2014    Procedure: RIGHT  HEART CATH;  Surgeon: Lesleigh Noe, MD;  Location: [facility] CATH LAB;  Service: Cardiovascular;  Laterality: N/A;  . Left heart catheterization with coronary angiogram  01/10/2014    Procedure: LEFT HEART CATHETERIZATION WITH CORONARY ANGIOGRAM;  Surgeon: Lesleigh Noe, MD;  Location: [facility] CATH LAB;  Service: Cardiovascular;;     Allergies  Allergen Reactions  . Ibuprofen     Upsets her stomach and causes abdominal pain  . Celexa [Citalopram Hydrobromide] Other (See Comments)    Dyskinesia  . Codeine Nausea And Vomiting    HALLUCINATIONS  . Reglan [Metoclopramide] Other (See Comments)    DYSKINESIA      Family History  Problem Relation Age of Onset  . Hypertension Mother   . Alzheimer's disease Mother   . Stroke Mother   . Heart attack Mother   . Heart disease Neg Hx   . Hypertension Other   . Colon cancer Neg Hx      Social History Ms. Pendry reports that she quit smoking about 31 years ago. She started smoking about 48 years ago. She has never used smokeless tobacco. Ms. Burandt reports that she does not drink alcohol.   Review of Systems CONSTITUTIONAL: No weight loss, fever, chills, weakness or fatigue.  HEENT: Eyes: No visual loss, blurred vision, double vision or yellow sclerae.No hearing loss, sneezing, congestion, runny nose or sore throat.  SKIN: No rash or itching.  CARDIOVASCULAR: per HPI RESPIRATORY: No shortness of breath, cough or sputum.  GASTROINTESTINAL: No anorexia, nausea, vomiting or diarrhea. No abdominal pain or blood.    GENITOURINARY: No burning on urination, no polyuria NEUROLOGICAL: No headache, dizziness, syncope, paralysis, ataxia, numbness or tingling in the extremities. No change in bowel or bladder control.  MUSCULOSKELETAL: No muscle, back pain, joint pain or stiffness.  LYMPHATICS: No enlarged nodes. No history of splenectomy.  PSYCHIATRIC: No history of depression or anxiety.  ENDOCRINOLOGIC: No reports of sweating, cold or heat intolerance. No polyuria or polydipsia.  Marland Kitchen   Physical Examination p 61 bp 104/70 Wt 188 lbs BMI 30 Gen: resting comfortably, no acute distress HEENT: no scleral icterus, pupils equal round and reactive, no palptable cervical adenopathy,  CV: RRR, no m/r/,g no JVD, no carotid bruits Resp: Clear to auscultation bilaterally GI: abdomen is soft, non-tender, mildly distended, normal bowel sounds, no hepatosplenomegaly MSK: extremities are warm, no edema.  Skin: warm, no rash Neuro:  no focal deficits Psych: appropriate affect   Diagnostic Studies 12/2013 Echo Study Conclusions  - Limited study to evaluate LV systolic function. - Left ventricle: The cavity size was normal. Wall thickness was normal. Systolic function was normal. The estimated ejection fraction was in the range of 50% to 55%. Wall motion was normal; there were no regional wall motion abnormalities. The study was not technically sufficient to allow evaluation of LV diastolic dysfunction due to atrial fibrillation. - Aortic valve: Valve area (VTI): 2.49 cm^2. Valve area (Vmax): 2.11 cm^2. - Mitral valve: There was moderate regurgitation. The MR vena contracta is 0.4 cm. - Left atrium: The atrium was severely dilated. - Right ventricle: The cavity size was mildly to moderately dilated. - Right atrium: The atrium was severely dilated. - Atrial septum: From available images no evidence of ASD or PFO by 2D or color Doppler images. - Tricuspid valve: There was moderate  regurgitation. - Pulmonary arteries: Systolic pressure was severely increased. PA peak pressure: 85 mm Hg (S).  01/2014 RHC HEMODYNAMICS: Aortic pressure 123/73 mmHg; LV pressure 122/6  mmHg; LVEDP 15 mm mercury; RA 4 mmHg; RV 31/5; PA 38/13 mmHg; PCWP(mean) unable to wedge the balloon tip catheter; Cardiac Output by Fick 4.73 L per minute  ANGIOGRAPHIC DATA: The left main coronary artery is normal.  The left anterior descending artery is widely patent, traverses the LV apex, gives origin to 2 diagonals, and has no significant obstruction..  The left circumflex artery is widely patent and gives a single large obtuse marginal [name] that trifurcates on the lateral wall. No significant obstruction.  The right coronary artery is dominant, gives 3 left ventricular branches and a large bifurcating PDA. No obstructions noted.Marland Kitchen  LEFT VENTRICULOGRAM: Left ventricular angiogram was done in the 30 RAO projection and revealed normal LV cavity size, tachycardia due to atrial fibrillation, and mild global reduction in LV function with an estimated ejection fraction of 40%   IMPRESSIONS: 1. Widely patent coronary arteries 2. Mild pulmonary hypertension 3. Mildly depressed LV systolic function with an estimated EF of 40% 3. This did not confirm significant pulmonary hypertension. The procedure did suggest that the right atrium and right ventricle were enlarged as we had difficulty manipulating the right heart catheter into the pulmonary artery.   RECOMMENDATION: Back to the outpatient area at home later today if no complications. She should not resume eloquence until 24 hours post procedure..     Assessment and Plan  1. Acute diastolic HF - significant weight loss over the last several weeks. She cut back her lasix to 40mg  daily on her own due to dizziness, will continue current dosing. Counseled to take bid if notices increased weight or swelling.    2. Pulmonary HTN - severe by echo, RHC  did not show significantly elevated pressures. From notes technically unable to obtain PCWP, unclear if technical issues with rest of RHC. Abdominal and LE swelling improving but still present - Unclear why such large discrepancy between echo and RHC. Possibilities included technical issues with RHC, severely elevated left sided pressures at time of echo due to acute diastolic HF, or worsening RV function with corresponding pulm pressure drop.  - she has continued to diurese, will repeat echo to further evaluate.  3. Hypothyroidism - may be playing a role in her severe weight gain and edema - recent increase in synthroid by pcp, continue to follow - followed by Dr [name]      Antoine Poche, M.D.   10/28/14 Addendum Echo reviewed, LVEF 40-45%. PASP 61 mmHg (down from 85 on prior echo), some evidence of RV dysfunction with RV TAPSE 1.4 and tissue anular systolic velocity of 7.5. Cannot evaluate diastolic function, however severe biatrial enlarement suggests significant dysfunction, the interatrial septum also bows to the right consistent with elevated LA pressures. Elevated PASP with repeat echo despite significant diuresis. Cath 01/2014 with PA 38/13 no mean PA documented by calculated at 21 mmHg. From cath notes technically difficult wedging catheter, unclear if other technical issues that might explain the large discrepancy between PA pressure by echo and cath. Swelling and ascites have continued to be a problem, may consider repeating RHC in near future.    Dominga Ferry MD

## 2014-10-14 ENCOUNTER — Telehealth: Payer: Self-pay

## 2014-10-14 ENCOUNTER — Other Ambulatory Visit: Payer: Self-pay

## 2014-10-14 DIAGNOSIS — R140 Abdominal distension (gaseous): Secondary | ICD-10-CM

## 2014-10-14 DIAGNOSIS — R109 Unspecified abdominal pain: Secondary | ICD-10-CM

## 2014-10-14 DIAGNOSIS — R188 Other ascites: Secondary | ICD-10-CM

## 2014-10-14 NOTE — Telephone Encounter (Signed)
Noted. Routing to [facility] Clinical Staff.

## 2014-10-14 NOTE — Progress Notes (Signed)
Quick Note:  Appears diarrhea not as much of an issues now.  Can try Miralax BID until BM, then daily prn.  If severe abd pain, then to ER. ______

## 2014-10-14 NOTE — Progress Notes (Signed)
Quick Note:  I called and informed Denise. I told her we will be checking with Dr. Wyline Mood about holding the Eloquis to do the paracentesis. She will just plan to do all of the labs on the day of para.  She also wanted me to let [name] know that pt is having a lot of abdominal pain. She hasn't had a BM in 3 days, and she usually has one every day.   Also, they did not pick up the Xifaxin, it was going to cost about $600.00.   Please advise! ( Separate note sent to Dr. Dina Rich in reference to holding the Eloquis for the paracentesis. ______

## 2014-10-14 NOTE — Telephone Encounter (Signed)
Called and spoke with daughter [name]. Pt has paracentesis arranged for 10/17/2014 @ 10:45 am.  Daughter is aware that pt needs to hold Eliquis starting 10/15/2014 and resume on 10/18/2014.  Labs faxed to [facility] so pt could have those done on the same day.  Daughter aware

## 2014-10-14 NOTE — Telephone Encounter (Signed)
Candy/Ginger, See result note regarding all orders related to abdominal paracentesis.  -if with ongoing abdominal distention, proceed with abd paracentesis. -Hold Eliquis for two days before procedure, resume day after procedure.

## 2014-10-14 NOTE — Progress Notes (Signed)
Quick Note:  Please find out from cardiology if we can hold Eliquis for abdominal paracentesis.  Please make sure radiology knows she is on Eliquis. ______

## 2014-10-14 NOTE — Telephone Encounter (Signed)
Yes, ok to hold eliquis for paracentesis. Typically would hold starting 2 days before, resume day after.  Dominga Ferry MD

## 2014-10-14 NOTE — Progress Notes (Signed)
Quick Note:  Iron stores are lower than one year ago.  -Stop ferrous sulfate.  -Start Fusion Plus one daily, #30, 3 refills.  -Need to recheck CBC now (see below), it has been six weeks since checked. -If she still has abdominal swelling, Dr. Darrick Penna recommends "DIAGNOSTIC PARACENTESIS" 1)send fluid for cytology, albumin, cell count/diff, fluid culture.  2)Needs LFTs the same day as tap. 3)Can have CBC same day as tap. ______

## 2014-10-14 NOTE — Telephone Encounter (Signed)
Dr. Wyline Mood,   This mutual patient was seen by Tana Coast, PA recently at our office [facility]).  [name] would like to know if it is OK to hold the ELOQUIS to do an abdominal paracentesis.  Please advise!

## 2014-10-14 NOTE — Progress Notes (Signed)
Quick Note:  [name] is aware of instructions. ______

## 2014-10-17 ENCOUNTER — Ambulatory Visit (HOSPITAL_COMMUNITY)
Admission: RE | Admit: 2014-10-17 | Discharge: 2014-10-17 | Disposition: A | Discharge status: Home / Self Care | Payer: Medicare Other | Source: Ambulatory Visit | Attending: Gastroenterology | Admitting: Gastroenterology

## 2014-10-17 ENCOUNTER — Other Ambulatory Visit: Payer: Self-pay | Admitting: Gastroenterology

## 2014-10-17 DIAGNOSIS — R109 Unspecified abdominal pain: Secondary | ICD-10-CM | POA: Insufficient documentation

## 2014-10-17 DIAGNOSIS — R188 Other ascites: Secondary | ICD-10-CM | POA: Diagnosis not present

## 2014-10-17 DIAGNOSIS — R140 Abdominal distension (gaseous): Secondary | ICD-10-CM

## 2014-10-17 DIAGNOSIS — I4891 Unspecified atrial fibrillation: Secondary | ICD-10-CM | POA: Insufficient documentation

## 2014-10-17 DIAGNOSIS — I10 Essential (primary) hypertension: Secondary | ICD-10-CM | POA: Insufficient documentation

## 2014-10-17 DIAGNOSIS — I509 Heart failure, unspecified: Secondary | ICD-10-CM | POA: Insufficient documentation

## 2014-10-17 DIAGNOSIS — E119 Type 2 diabetes mellitus without complications: Secondary | ICD-10-CM | POA: Diagnosis not present

## 2014-10-17 LAB — CBC
HCT: 27.9 % — ABNORMAL LOW (ref 36.0–46.0)
Hemoglobin: 7.9 g/dL — ABNORMAL LOW (ref 12.0–15.0)
MCH: 21.7 pg — ABNORMAL LOW (ref 26.0–34.0)
MCHC: 28.3 g/dL — AB (ref 30.0–36.0)
MCV: 76.6 fL — ABNORMAL LOW (ref 78.0–100.0)
MPV: 10.7 fL (ref 8.6–12.4)
Platelets: 272 10*3/uL (ref 150–400)
RBC: 3.64 MIL/uL — ABNORMAL LOW (ref 3.87–5.11)
RDW: 20 % — AB (ref 11.5–15.5)
WBC: 5.9 10*3/uL (ref 4.0–10.5)

## 2014-10-18 LAB — HEPATIC FUNCTION PANEL
ALT: <8 U/L (ref 0–35)
AST: 10 U/L (ref 0–37)
Albumin: 3.8 g/dL (ref 3.5–5.2)
Alkaline Phosphatase: 48 U/L (ref 39–117)
Bilirubin, Direct: 0.2 mg/dL (ref 0.0–0.3)
Indirect Bilirubin: 0.4 mg/dL (ref 0.2–1.2)
Total Bilirubin: 0.6 mg/dL (ref 0.2–1.2)
Total Protein: 6.4 g/dL (ref 6.0–8.3)

## 2014-10-21 DIAGNOSIS — G259 Extrapyramidal and movement disorder, unspecified: Secondary | ICD-10-CM | POA: Diagnosis not present

## 2014-10-21 DIAGNOSIS — G40309 Generalized idiopathic epilepsy and epileptic syndromes, not intractable, without status epilepticus: Secondary | ICD-10-CM | POA: Diagnosis not present

## 2014-10-21 DIAGNOSIS — E538 Deficiency of other specified B group vitamins: Secondary | ICD-10-CM | POA: Diagnosis not present

## 2014-10-21 DIAGNOSIS — F09 Unspecified mental disorder due to known physiological condition: Secondary | ICD-10-CM | POA: Diagnosis not present

## 2014-10-22 NOTE — Progress Notes (Signed)
Quick Note:  As they should be aware, no paracentesis done due to not enough fluid (see limited abd u/s report). Her LFTs were normal. Her Hgb is stable since 09/2014 but still low.   1# Is she having increase SOB, lightheadedness/dizziness? If so, we can give blood transfusion. 2# Is she still having nosebleeds? 3# If she is in agreement, we should consider EGD now (for decrease in Hgb and follow up Barrett's). We will need to hold Eliquis for 2 days before. ______

## 2014-10-22 NOTE — Progress Notes (Signed)
Quick Note:  Spoke with Dr. Tyron Russell. Advised against attempted diagnostic tap given very limited ascites. ______

## 2014-10-22 NOTE — Progress Notes (Signed)
Quick Note:  I called to inform [name], the pt's daughter. She said her mom is not having SOB or lightheadedness much now.  No nosebleeds.  She wants to discuss with her mom about the EGD and call me tomorrow.  She also asked about the Fusion Plus and that has been called to [facility] at [facility] #30 one daily with 3 refills. ______

## 2014-10-23 NOTE — Progress Notes (Signed)
Quick Note:  LMOM to call. ______ 

## 2014-10-24 ENCOUNTER — Ambulatory Visit (HOSPITAL_COMMUNITY)
Admission: RE | Admit: 2014-10-24 | Discharge: 2014-10-24 | Disposition: A | Discharge status: Home / Self Care | Payer: Medicare Other | Source: Ambulatory Visit | Attending: Cardiology | Admitting: Cardiology

## 2014-10-24 DIAGNOSIS — I5032 Chronic diastolic (congestive) heart failure: Secondary | ICD-10-CM

## 2014-10-24 DIAGNOSIS — I509 Heart failure, unspecified: Secondary | ICD-10-CM | POA: Diagnosis not present

## 2014-10-24 NOTE — Progress Notes (Signed)
Quick Note:  [name] called and ok to schedule the EGD. ______

## 2014-10-24 NOTE — Progress Notes (Signed)
  Echocardiogram 2D Echocardiogram has been performed.  Stacey Drain 10/24/2014, 1:37 PM

## 2014-10-28 ENCOUNTER — Other Ambulatory Visit: Payer: Self-pay

## 2014-10-28 DIAGNOSIS — K2270 Barrett's esophagus without dysplasia: Secondary | ICD-10-CM

## 2014-10-28 DIAGNOSIS — D509 Iron deficiency anemia, unspecified: Secondary | ICD-10-CM

## 2014-10-28 DIAGNOSIS — D649 Anemia, unspecified: Secondary | ICD-10-CM

## 2014-10-28 NOTE — Progress Notes (Signed)
Quick Note:  No change in diabetic meds per Tana Coast, PA.  Instructions mailed to pt and lab orders. ______

## 2014-10-28 NOTE — Progress Notes (Signed)
Quick Note:  OK to schedule EGD with [facility]. Hold Eliquis for 2 days before.  *repeat CBC in 2 weeks. ______

## 2014-10-28 NOTE — Progress Notes (Signed)
Quick Note:  Pt requested a Friday. She is scheduled for 11/21/2014 at 12:30 Pm with Dr. Darrick Penna for the EGD. [name] is aware she will hold her Eloquis on 5/18/ 2016 and 11/20/2014.  She is aware of the lab order to do CBC on 11/11/2014 and that order is being mailed along with the instructions for the EGD. ______

## 2014-11-11 ENCOUNTER — Ambulatory Visit (INDEPENDENT_AMBULATORY_CARE_PROVIDER_SITE_OTHER): Payer: Medicare Other | Admitting: Cardiology

## 2014-11-11 ENCOUNTER — Encounter: Payer: Self-pay | Admitting: Cardiology

## 2014-11-11 VITALS — BP 116/78 | HR 60 | Ht 66.0 in | Wt 188.0 lb

## 2014-11-11 DIAGNOSIS — R188 Other ascites: Secondary | ICD-10-CM

## 2014-11-11 DIAGNOSIS — D509 Iron deficiency anemia, unspecified: Secondary | ICD-10-CM | POA: Diagnosis not present

## 2014-11-11 DIAGNOSIS — I5032 Chronic diastolic (congestive) heart failure: Secondary | ICD-10-CM

## 2014-11-11 DIAGNOSIS — I10 Essential (primary) hypertension: Secondary | ICD-10-CM

## 2014-11-11 DIAGNOSIS — I482 Chronic atrial fibrillation, unspecified: Secondary | ICD-10-CM

## 2014-11-11 DIAGNOSIS — I272 Pulmonary hypertension, unspecified: Secondary | ICD-10-CM

## 2014-11-11 DIAGNOSIS — I270 Primary pulmonary hypertension: Secondary | ICD-10-CM

## 2014-11-11 MED ORDER — CARVEDILOL 3.125 MG PO TABS: 3.1250 mg | ORAL_TABLET | Freq: Two times a day (BID) | ORAL | Status: DC

## 2014-11-11 MED ORDER — LISINOPRIL 2.5 MG PO TABS: 2.5000 mg | ORAL_TABLET | Freq: Every day | ORAL | Status: DC

## 2014-11-11 NOTE — Progress Notes (Signed)
Clinical Summary Ms. Staffa is a 64 y.o.female seen today for follow up of the following medical problems.   1. Chronicsystolic/ diastolic CHF - echo 10/2014 LVEF 40%, afib cannot eval diastolic function but severe biatrial enlargement suggests significant diastolic dysfunction.  - since last visit swelling has been up and down. Taking lasix 1-2 times a day, feels fatigued after taking twice a day.   2. Pulmonary Hypertension - echo 12/2013 PASP 85, moderate TR, RV mild to moderately dilated with normal function, could not eval diasotlic function due to afib, LVEF 50-55%. Biatrial enlargement.  - RHC 01/2015 PA 38/13 and calculated mean of 21, could not get a wedge however LVEDP 15. - repeat echo 10/2014 PASP 61 mmHg (down from 85 on prior echo), some evidence of RV dysfunction with RV TAPSE 1.4 and tissue anular systolic velocity of 7.5. Cannot evaluate diastolic function, however severe biatrial enlarement suggests significant dysfunction,   3. Hypothyroidism - followed by pcp  4. Afib - no palpitations - compliant with metoprolol and eliquis  5. Ascites - followed by GI, recent US 10/2014 with scattered mild ascites without large pocket. 08/2014 CT scan hepatomegaly Past Medical History  Diagnosis Date  . Chest pain     Negative cardiac catheterization in 2002; negative stress nuclear study in 2008  . Congestive heart failure     Normal LV systolic function  . PAF (paroxysmal atrial fibrillation) 2009  . Chronic anticoagulation   . Diabetes mellitus, type 2     Insulin therapy; exacerbated by prednisone  . Syncope     Admitted 05/2009; magnetic resonance imagin/ MRA - negative; etiology thought to be orthostasis secondary to drugs and dehydration  . GERD (gastroesophageal reflux disease)   . IBS (irritable bowel syndrome)   . Hiatal hernia   . Gastroparesis     99% retention 05/2008 on GES  . Hyperlipidemia   . Hypertension   . Hypothyroid   . Chronic LBP    Surgical intervention in 1996  . Obesity   . OSA on CPAP   . Anemia     H/H of 10/30 with a normal MCV in 12/09  . Barrett's esophagus     Diagnosed 1995. Last EGD 2008.   . Seizures      Allergies  Allergen Reactions  . Ibuprofen     Upsets her stomach and causes abdominal pain  . Celexa [Citalopram Hydrobromide] Other (See Comments)    Dyskinesia  . Codeine Nausea And Vomiting    HALLUCINATIONS  . Reglan [Metoclopramide] Other (See Comments)    DYSKINESIA     Current Outpatient Prescriptions  Medication Sig Dispense Refill  . dronabinol (MARINOL) 2.5 MG capsule Take 1 capsule (2.5 mg total) by mouth 2 (two) times daily before a meal. 60 capsule 0  . DULoxetine (CYMBALTA) 30 MG capsule Take 30 mg by mouth 2 (two) times daily.    Marland Kitchen ELIQUIS 5 MG TABS tablet TAKE 1 TABLET BY MOUTH TWICE DAILY. 60 tablet 3  . ferrous sulfate 325 (65 FE) MG tablet Take 325 mg by mouth daily with breakfast.    . furosemide (LASIX) 40 MG tablet Take 1 tablet (40 mg total) by mouth 2 (two) times daily. 180 tablet 3  . glipiZIDE (GLUCOTROL) 10 MG tablet Take 10 mg by mouth daily before breakfast.     . Insulin Glargine (LANTUS SOLOSTAR) 100 UNIT/ML Solostar Pen Inject 20-50 Units into the skin at bedtime as needed. Patient takes 20 units every  night at bedtime if over 178.  Takes 50 units if her blood sugar is above 300.    . levETIRAcetam (KEPPRA) 250 MG tablet Take 1 tablet (250 mg total) by mouth 2 (two) times daily. 60 tablet 11  . levothyroxine (SYNTHROID, LEVOTHROID) 125 MCG tablet Take 125 mcg by mouth daily.    Marland Kitchen loperamide (IMODIUM) 2 MG capsule TAKE (1) CAPSULE THREE TIMES DAILY AS NEEDED FOR DIARRHEA OR LOOSE STOOLS. 30 capsule 5  . LORazepam (ATIVAN) 1 MG tablet Take 1-2 mg by mouth daily as needed for anxiety or sleep. For anxiety    . magnesium oxide (MAG-OX) 400 MG tablet Take 1 tablet (400 mg total) by mouth daily. 30 tablet 6  . metaxalone (SKELAXIN) 800 MG tablet     . metFORMIN  (GLUCOPHAGE) 500 MG tablet Take 1,000 mg by mouth 2 (two) times daily with a meal.     . metoprolol tartrate (LOPRESSOR) 25 MG tablet Take 1 tablet (25 mg total) by mouth 2 (two) times daily. 60 tablet 0  . Multiple Vitamin (MULTIVITAMIN WITH MINERALS) TABS tablet Take 1 tablet by mouth daily.    . nitroGLYCERIN (NITROSTAT) 0.4 MG SL tablet Place 0.4 mg under the tongue every 5 (five) minutes as needed for chest pain. For chest pain     . omeprazole (PRILOSEC) 20 MG capsule Take 20 mg by mouth 2 (two) times daily.      . ondansetron (ZOFRAN) 4 MG tablet Take 4 mg by mouth every 6 (six) hours as needed. For nausea    . potassium chloride SA (K-DUR,KLOR-CON) 20 MEQ tablet Take 1 tablet (20 mEq total) by mouth 4 (four) times daily. 120 tablet 11  . rifaximin (XIFAXAN) 550 MG TABS tablet Take 1 tablet (550 mg total) by mouth 3 (three) times daily. 42 tablet 0  . vitamin B-12 (CYANOCOBALAMIN) 500 MCG tablet Take 500 mcg by mouth daily.     No current facility-administered medications for this visit.     Past Surgical History  Procedure Laterality Date  . Cardiovascular stress test  2008    Stress nuclear study  . Back surgery  1996  . Laparoscopic cholecystectomy  1990s  . Total abdominal hysterectomy  1999  . Laminectomy  1995    L4-L5  . Carpal tunnel release  1994  . Esophagogastroduodenoscopy  2008    Barrett's without dysplasia. esphagus dilated. antral erosions, h.pylori serologies negative.  . Colonoscopy  11/2011    Dr. Darrick Penna: Internal hemorrhoids, mild diverticulosis. Random colon biopsies negative.  . Givens capsule study  12/07/2011    Proximal small bowel, rare AVM. Distal small bowel, multiple ulcers noted  . Esophagogastroduodenoscopy  11/2011    Dr. Darrick Penna: Barrett's esophagus, mild gastritis, diverticulum in the second portion of the duodenum repeat EGD 3 years. Small bowel biopsies negative. Gastric biopsy show reactive gastropathy but no H. pylori. Esophageal biopsies  consistent with GERD. Next EGD 11/2014  . Givens capsule study N/A 09/27/2013    Distal small bowel ulcers extending to TI.  Marland Kitchen Givens capsule study N/A 10/10/2013    Procedure: GIVENS CAPSULE STUDY;  Surgeon: West Bali, MD;  Location: [facility];  Service: Endoscopy;  Laterality: N/A;  7:30  . Colonoscopy N/A 11/08/2013    SLF: Normal mucosa in the terminal ileum/The colon IS redundant/  Moderate diverticulosis throughout the entire colon. ileum bx benign. colon bx benign  . Esophageal biopsy N/A 11/08/2013    Procedure: BIOPSY  / Tissue sampling /  ulcers present in small intestine;  Surgeon: West Bali, MD;  Location: [facility];  Service: Endoscopy;  Laterality: N/A;  . Cardiac catheterization  2002  . Right heart catheterization N/A 01/10/2014    Procedure: RIGHT HEART CATH;  Surgeon: Lesleigh Noe, MD;  Location: [facility] CATH LAB;  Service: Cardiovascular;  Laterality: N/A;  . Left heart catheterization with coronary angiogram  01/10/2014    Procedure: LEFT HEART CATHETERIZATION WITH CORONARY ANGIOGRAM;  Surgeon: Lesleigh Noe, MD;  Location: [facility] CATH LAB;  Service: Cardiovascular;;     Allergies  Allergen Reactions  . Ibuprofen     Upsets her stomach and causes abdominal pain  . Celexa [Citalopram Hydrobromide] Other (See Comments)    Dyskinesia  . Codeine Nausea And Vomiting    HALLUCINATIONS  . Reglan [Metoclopramide] Other (See Comments)    DYSKINESIA      Family History  Problem Relation Age of Onset  . Hypertension Mother   . Alzheimer's disease Mother   . Stroke Mother   . Heart attack Mother   . Heart disease Neg Hx   . Hypertension Other   . Colon cancer Neg Hx      Social History Ms. Reen reports that she quit smoking about 31 years ago. She started smoking about 48 years ago. She has never used smokeless tobacco. Ms. Pingley reports that she does not drink alcohol.   Review of Systems CONSTITUTIONAL: No weight loss, fever, chills, weakness or  fatigue.  HEENT: Eyes: No visual loss, blurred vision, double vision or yellow sclerae.No hearing loss, sneezing, congestion, runny nose or sore throat.  SKIN: No rash or itching.  CARDIOVASCULAR: per HPI RESPIRATORY: No shortness of breath, cough or sputum.  GASTROINTESTINAL: No anorexia, nausea, vomiting or diarrhea. No abdominal pain or blood.  GENITOURINARY: No burning on urination, no polyuria NEUROLOGICAL: No headache, dizziness, syncope, paralysis, ataxia, numbness or tingling in the extremities. No change in bowel or bladder control.  MUSCULOSKELETAL: No muscle, back pain, joint pain or stiffness.  LYMPHATICS: No enlarged nodes. No history of splenectomy.  PSYCHIATRIC: No history of depression or anxiety.  ENDOCRINOLOGIC: No reports of sweating, cold or heat intolerance. No polyuria or polydipsia.  Marland Kitchen   Physical Examination Filed Vitals:   11/11/14 1532  BP: 116/78  Pulse: 60   Filed Vitals:   11/11/14 1532  Height: 5\' 6"  (1.676 m)  Weight: 188 lb (85.276 kg)    Gen: resting comfortably, no acute distress HEENT: no scleral icterus, pupils equal round and reactive, no palptable cervical adenopathy,  CV: RRR, no m/r/g, no JVD, no carotid bruits Resp: Clear to auscultation bilaterally GI: abdomen is soft, non-tender, non-distended, normal bowel sounds, no hepatosplenomegaly MSK: extremities are warm, no edema.  Skin: warm, no rash Neuro:  no focal deficits Psych: appropriate affect   Diagnostic Studies 12/2013 Echo Study Conclusions  - Limited study to evaluate LV systolic function. - Left ventricle: The cavity size was normal. Wall thickness was normal. Systolic function was normal. The estimated ejection fraction was in the range of 50% to 55%. Wall motion was normal; there were no regional wall motion abnormalities. The study was not technically sufficient to allow evaluation of LV diastolic dysfunction due to atrial fibrillation. - Aortic valve: Valve  area (VTI): 2.49 cm^2. Valve area (Vmax): 2.11 cm^2. - Mitral valve: There was moderate regurgitation. The MR vena contracta is 0.4 cm. - Left atrium: The atrium was severely dilated. - Right ventricle: The cavity size  was mildly to moderately dilated. - Right atrium: The atrium was severely dilated. - Atrial septum: From available images no evidence of ASD or PFO by 2D or color Doppler images. - Tricuspid valve: There was moderate regurgitation. - Pulmonary arteries: Systolic pressure was severely increased. PA peak pressure: 85 mm Hg (S).  01/2014 RHC HEMODYNAMICS: Aortic pressure 123/73 mmHg; LV pressure 122/6 mmHg; LVEDP 15 mm mercury; RA 4 mmHg; RV 31/5; PA 38/13 mmHg; PCWP(mean) unable to wedge the balloon tip catheter; Cardiac Output by Fick 4.73 L per minute  ANGIOGRAPHIC DATA: The left main coronary artery is normal.  The left anterior descending artery is widely patent, traverses the LV apex, gives origin to 2 diagonals, and has no significant obstruction..  The left circumflex artery is widely patent and gives a single large obtuse marginal Vernadine Coombs that trifurcates on the lateral wall. No significant obstruction.  The right coronary artery is dominant, gives 3 left ventricular branches and a large bifurcating PDA. No obstructions noted.Marland Kitchen  LEFT VENTRICULOGRAM: Left ventricular angiogram was done in the 30 RAO projection and revealed normal LV cavity size, tachycardia due to atrial fibrillation, and mild global reduction in LV function with an estimated ejection fraction of 40%   IMPRESSIONS: 1. Widely patent coronary arteries 2. Mild pulmonary hypertension 3. Mildly depressed LV systolic function with an estimated EF of 40% 3. This did not confirm significant pulmonary hypertension. The procedure did suggest that the right atrium and right ventricle were enlarged as we had difficulty manipulating the right heart catheter into the pulmonary  artery.   RECOMMENDATION: Back to the outpatient area at home later today if no complications. She should not resume eloquence until 24 hours post procedure..  10/2014 Echo Study Conclusions  - Left ventricle: The cavity size was normal. Wall thickness was increased in a pattern of mild LVH. There was mild concentric hypertrophy. The estimated ejection fraction was 40%. Diffuse hypokinesis. - Mitral valve: Calcified annulus. There was mild regurgitation. - Left atrium: The atrium was severely dilated. - Right atrium: The atrium was severely dilated. - Atrial septum: Thinned and stretched but no obvious PFO. No defect or patent foramen ovale was identified. - Tricuspid valve: There was moderate regurgitation. - Impressions: E/E&' not reported and in afib diastolic evaluation not possible.  Impressions:  - E/E&' not reported and in afib diastolic evaluation not possible.    Assessment and Plan  1. Chronic systolic/diastolic HF - significant weight loss over the last several weeks. She cut back her lasix to  daily on her own due to dizziness, will continue current dosing. Counseled to take bid if notices increased weight or swelling.    2. Pulmonary HTN - severe by echo, RHC did not show significantly elevated pressures. From notes technically unable to obtain PCWP, unclear if technical issues with rest of RHC. Abdominal and LE swelling improving but still present - Unclear why such large discrepancy between echo and RHC. Possibilities included technical issues with RHC, severely elevated left sided pressures at time of echo due to acute diastolic HF, or worsening RV function with corresponding pulm pressure drop.  - repeat echo shows PASP 61, evidence of right sided dysfunction. Will discuss possibly repeating her RHC, will discuss with CHF team.   3. Hypothyroidism - followed by Dr [name]  4. Hepatomegaly - continue diuretics  F/u 1 month Antoine Poche, M.D.

## 2014-11-11 NOTE — Patient Instructions (Signed)
Your physician recommends that you schedule a follow-up appointment in: 1 month     STOP Lopressor   START Coreg 3.125 mg twice a day  START Lisinopril 2.5 mg daily   Lab work in 2 weeks 11/25/14     Thank you for choosing [facility] !

## 2014-11-12 LAB — CBC WITH DIFFERENTIAL/PLATELET
BASOS ABS: 0 10*3/uL (ref 0.0–0.1)
Basophils Relative: 0 % (ref 0–1)
EOS ABS: 0.1 10*3/uL (ref 0.0–0.7)
Eosinophils Relative: 1 % (ref 0–5)
HEMATOCRIT: 29.4 % — AB (ref 36.0–46.0)
Hemoglobin: 8.5 g/dL — ABNORMAL LOW (ref 12.0–15.0)
Lymphocytes Relative: 36 % (ref 12–46)
Lymphs Abs: 2.3 10*3/uL (ref 0.7–4.0)
MCH: 22.1 pg — ABNORMAL LOW (ref 26.0–34.0)
MCHC: 28.9 g/dL — ABNORMAL LOW (ref 30.0–36.0)
MCV: 76.6 fL — ABNORMAL LOW (ref 78.0–100.0)
MONO ABS: 0.7 10*3/uL (ref 0.1–1.0)
MPV: 10.8 fL (ref 8.6–12.4)
Monocytes Relative: 11 % (ref 3–12)
NEUTROS PCT: 52 % (ref 43–77)
Neutro Abs: 3.3 10*3/uL (ref 1.7–7.7)
Platelets: 242 10*3/uL (ref 150–400)
RBC: 3.84 MIL/uL — ABNORMAL LOW (ref 3.87–5.11)
RDW: 19.6 % — AB (ref 11.5–15.5)
WBC: 6.4 10*3/uL (ref 4.0–10.5)

## 2014-11-19 NOTE — Progress Notes (Signed)
Quick Note:  Slight improvement in Hgb Continue fusion plus.  egd Friday as planned. Await findings. ______

## 2014-11-20 NOTE — Progress Notes (Signed)
Quick Note:  [name]. [name] is aware. ______

## 2014-11-21 ENCOUNTER — Ambulatory Visit (HOSPITAL_COMMUNITY)
Admission: RE | Admit: 2014-11-21 | Discharge: 2014-11-21 | Disposition: A | Discharge status: Home / Self Care | Payer: Medicare Other | Source: Ambulatory Visit | Attending: Gastroenterology | Admitting: Gastroenterology

## 2014-11-21 ENCOUNTER — Encounter (HOSPITAL_COMMUNITY)
Admission: RE | Disposition: A | Discharge status: Home / Self Care | Payer: Self-pay | Source: Ambulatory Visit | Attending: Gastroenterology

## 2014-11-21 ENCOUNTER — Encounter (HOSPITAL_COMMUNITY): Payer: Self-pay | Admitting: *Deleted

## 2014-11-21 DIAGNOSIS — E669 Obesity, unspecified: Secondary | ICD-10-CM | POA: Diagnosis not present

## 2014-11-21 DIAGNOSIS — I10 Essential (primary) hypertension: Secondary | ICD-10-CM | POA: Diagnosis not present

## 2014-11-21 DIAGNOSIS — R569 Unspecified convulsions: Secondary | ICD-10-CM | POA: Insufficient documentation

## 2014-11-21 DIAGNOSIS — K589 Irritable bowel syndrome without diarrhea: Secondary | ICD-10-CM | POA: Insufficient documentation

## 2014-11-21 DIAGNOSIS — G4733 Obstructive sleep apnea (adult) (pediatric): Secondary | ICD-10-CM | POA: Diagnosis not present

## 2014-11-21 DIAGNOSIS — G8929 Other chronic pain: Secondary | ICD-10-CM | POA: Insufficient documentation

## 2014-11-21 DIAGNOSIS — Z79899 Other long term (current) drug therapy: Secondary | ICD-10-CM | POA: Insufficient documentation

## 2014-11-21 DIAGNOSIS — Z9989 Dependence on other enabling machines and devices: Secondary | ICD-10-CM | POA: Insufficient documentation

## 2014-11-21 DIAGNOSIS — Z9049 Acquired absence of other specified parts of digestive tract: Secondary | ICD-10-CM | POA: Insufficient documentation

## 2014-11-21 DIAGNOSIS — Z87891 Personal history of nicotine dependence: Secondary | ICD-10-CM | POA: Insufficient documentation

## 2014-11-21 DIAGNOSIS — I480 Paroxysmal atrial fibrillation: Secondary | ICD-10-CM | POA: Diagnosis not present

## 2014-11-21 DIAGNOSIS — Z6830 Body mass index (BMI) 30.0-30.9, adult: Secondary | ICD-10-CM | POA: Diagnosis not present

## 2014-11-21 DIAGNOSIS — M545 Low back pain: Secondary | ICD-10-CM | POA: Insufficient documentation

## 2014-11-21 DIAGNOSIS — Z794 Long term (current) use of insulin: Secondary | ICD-10-CM | POA: Diagnosis not present

## 2014-11-21 DIAGNOSIS — K3184 Gastroparesis: Secondary | ICD-10-CM | POA: Diagnosis not present

## 2014-11-21 DIAGNOSIS — K219 Gastro-esophageal reflux disease without esophagitis: Secondary | ICD-10-CM | POA: Diagnosis not present

## 2014-11-21 DIAGNOSIS — K2960 Other gastritis without bleeding: Secondary | ICD-10-CM | POA: Diagnosis not present

## 2014-11-21 DIAGNOSIS — E039 Hypothyroidism, unspecified: Secondary | ICD-10-CM | POA: Diagnosis not present

## 2014-11-21 DIAGNOSIS — K2270 Barrett's esophagus without dysplasia: Secondary | ICD-10-CM | POA: Diagnosis not present

## 2014-11-21 DIAGNOSIS — I509 Heart failure, unspecified: Secondary | ICD-10-CM | POA: Insufficient documentation

## 2014-11-21 DIAGNOSIS — E119 Type 2 diabetes mellitus without complications: Secondary | ICD-10-CM | POA: Diagnosis not present

## 2014-11-21 DIAGNOSIS — Z7901 Long term (current) use of anticoagulants: Secondary | ICD-10-CM | POA: Insufficient documentation

## 2014-11-21 DIAGNOSIS — D649 Anemia, unspecified: Secondary | ICD-10-CM | POA: Insufficient documentation

## 2014-11-21 HISTORY — PX: ESOPHAGOGASTRODUODENOSCOPY: SHX5428

## 2014-11-21 LAB — FERRITIN: FERRITIN: 7 ng/mL — AB (ref 11–307)

## 2014-11-21 LAB — GLUCOSE, CAPILLARY: Glucose-Capillary: 153 mg/dL — ABNORMAL HIGH (ref 65–99)

## 2014-11-21 SURGERY — EGD (ESOPHAGOGASTRODUODENOSCOPY)
Anesthesia: Moderate Sedation

## 2014-11-21 MED ORDER — STERILE WATER FOR IRRIGATION IR SOLN
Status: DC | PRN
  Administered 2014-11-21: 13:00:00

## 2014-11-21 MED ORDER — DICYCLOMINE HCL 10 MG PO CAPS: ORAL_CAPSULE | ORAL | Status: DC

## 2014-11-21 MED ORDER — MIDAZOLAM HCL 5 MG/5ML IJ SOLN
INTRAMUSCULAR | Status: DC | PRN
  Administered 2014-11-21: 1 mg via INTRAVENOUS
  Administered 2014-11-21: 2 mg via INTRAVENOUS

## 2014-11-21 MED ORDER — MEPERIDINE HCL 100 MG/ML IJ SOLN
INTRAMUSCULAR | Status: AC
  Filled 2014-11-21: qty 2

## 2014-11-21 MED ORDER — LIDOCAINE VISCOUS 2 % MT SOLN
OROMUCOSAL | Status: AC
  Filled 2014-11-21: qty 15

## 2014-11-21 MED ORDER — MIDAZOLAM HCL 5 MG/5ML IJ SOLN
INTRAMUSCULAR | Status: AC
  Filled 2014-11-21: qty 10

## 2014-11-21 MED ORDER — SODIUM CHLORIDE 0.9 % IV SOLN
INTRAVENOUS | Status: DC
  Administered 2014-11-21: 12:00:00 via INTRAVENOUS

## 2014-11-21 MED ORDER — FERROUS SULFATE 325 (65 FE) MG PO TABS: 325.0000 mg | ORAL_TABLET | Freq: Two times a day (BID) | ORAL | Status: DC

## 2014-11-21 MED ORDER — MEPERIDINE HCL 100 MG/ML IJ SOLN
INTRAMUSCULAR | Status: DC | PRN
  Administered 2014-11-21: 50 mg via INTRAVENOUS

## 2014-11-21 MED ORDER — LIDOCAINE VISCOUS 2 % MT SOLN
OROMUCOSAL | Status: DC | PRN
  Administered 2014-11-21: 1 via OROMUCOSAL

## 2014-11-21 NOTE — Op Note (Signed)
[facility] [address] [place], [address]   ENDOSCOPY PROCEDURE REPORT  PATIENT: Tara, [name]  MR#: [id number] BIRTHDATE: [birth year] , 63  yrs. old GENDER: female  ENDOSCOPIST: West Bali, MD REFERRED GY:BWLSL [name], M.D.  PROCEDURE DATE: 11-25-2014 PROCEDURE:   EGD, diagnostic  INDICATIONS:screening for Barrett's.   anemia. MEDICATIONS: Demerol 50 mg IV and Versed 3 mg IV TOPICAL ANESTHETIC:   Xylocaine Jelly ASA CLASS:  DESCRIPTION OF PROCEDURE:     Physical exam was performed.  Informed consent was obtained from the patient after explaining the benefits, risks, and alternatives to the procedure.  The patient was connected to the monitor and placed in the left lateral position.  Continuous oxygen was provided by nasal cannula and IV medicine administered through an indwelling cannula.  After administration of sedation, the patients esophagus was intubated and the EG-[id number] ([id number])  endoscope was advanced under direct visualization to the second portion of the duodenum.  The scope was removed slowly by carefully examining the color, texture, anatomy, and integrity of the mucosa on the way out.  The patient was recovered in endoscopy and discharged home in satisfactory condition.   ESOPHAGUS: IRREGULAR Z-LINE.  NO BARRETT'S.   STOMACH: Mild non-erosive gastritis (inflammation) was found in the gastric antrum.   DUODENUM: The duodenal mucosa showed no abnormalities in the bulb and 2nd part of the duodenum. COMPLICATIONS: There were no immediate complications.  ENDOSCOPIC IMPRESSION: 1.   IRREGULAR Z-LINE.  NO BARRETT'S 2.   MILD Non-erosive gastritis  RECOMMENDATIONS: CONTINUE OMEPRAZOLE 30 MINUTES PRIOR TO MEALS TWICE DAILY. AVOID TRIGGERS FOR GASTRITIS. BENTYL QAC AND HS OR Q4H PRN. NO NEED FOR CONTINUED BARRETT'S SURVEILLANCE. FOLLOW UP IN 4 MOS.  REPEAT EXAM:  eSigned:  West Bali, MD Nov 25, 2014 5:03 PM     CPT  CODES: ICD CODES:  The ICD and CPT codes recommended by this software are interpretations from the data that the clinical staff has captured with the software.  The verification of the translation of this report to the ICD and CPT codes and modifiers is the sole responsibility of the health care institution and practicing physician where this report was generated.  [employer] will not be held responsible for the validity of the ICD and CPT codes included on this report.  AMA assumes no liability for data contained or not contained herein. CPT is a Publishing rights manager of the [employer].

## 2014-11-21 NOTE — Discharge Instructions (Signed)
You have mild gastritis. YOU DO NOT HAVE BARRETT'S ESOPHAGUS. NO SOURCE FOR BLOOD LOSS WAS IDENTIFIED.  CONTINUE OMEPRAZOLE 30 MINUTES PRIOR TO MEALS TWICE DAILY.  AVOID TRIGGERS FOR GASTRITIS. SEE INFO BELOW.  FOLLOW UP IN 4 MOS.  UPPER ENDOSCOPY AFTER CARE Read the instructions outlined below and refer to this sheet in the next week. These discharge instructions provide you with general information on caring for yourself after you leave the hospital. While your treatment has been planned according to the most current medical practices available, unavoidable complications occasionally occur. If you have any problems or questions after discharge, call DR. Powell Halbert, [phone].  ACTIVITY  You may resume your regular activity, but move at a slower pace for the next 24 hours.   Take frequent rest periods for the next 24 hours.   Walking will help get rid of the air and reduce the bloated feeling in your belly (abdomen).   No driving for 24 hours (because of the medicine (anesthesia) used during the test).   You may shower.   Do not sign any important legal documents or operate any machinery for 24 hours (because of the anesthesia used during the test).    NUTRITION  Drink plenty of fluids.   You may resume your normal diet as instructed by your doctor.   Begin with a light meal and progress to your normal diet. Heavy or fried foods are harder to digest and may make you feel sick to your stomach (nauseated).   Avoid alcoholic beverages for 24 hours or as instructed.    MEDICATIONS  You may resume your normal medications.   WHAT YOU CAN EXPECT TODAY  Some feelings of bloating in the abdomen.   Passage of more gas than usual.    IF YOU HAD A BIOPSY TAKEN DURING THE UPPER ENDOSCOPY:  Eat a soft diet IF YOU HAVE NAUSEA, BLOATING, ABDOMINAL PAIN, OR VOMITING.    FINDING OUT THE RESULTS OF YOUR TEST Not all test results are available during your visit. DR. Darrick Penna WILL  CALL YOU WITHIN 7 DAYS OF YOUR PROCEDUE WITH YOUR RESULTS. Do not assume everything is normal if you have not heard from DR. Youlanda Tomassetti IN ONE WEEK, CALL HER OFFICE AT [phone].  SEEK IMMEDIATE MEDICAL ATTENTION AND CALL THE OFFICE: [phone] IF:  You have more than a spotting of blood in your stool.   Your belly is swollen (abdominal distention).   You are nauseated or vomiting.   You have a temperature over 101F.   You have abdominal pain or discomfort that is severe or gets worse throughout the day.

## 2014-11-21 NOTE — H&P (Signed)
Primary Care Physician:  [name], MD Primary Gastroenterologist:  Dr. Darrick Penna  Pre-Procedure History & Physical: HPI:  [name] is a 64 y.o. female here for Anemia/Barrett's esophagus.  Past Medical History  Diagnosis Date  . Chest pain     Negative cardiac catheterization in 2002; negative stress nuclear study in 2008  . Congestive heart failure     Normal LV systolic function  . PAF (paroxysmal atrial fibrillation) 2009  . Chronic anticoagulation   . Diabetes mellitus, type 2     Insulin therapy; exacerbated by prednisone  . Syncope     Admitted 05/2009; magnetic resonance imagin/ MRA - negative; etiology thought to be orthostasis secondary to drugs and dehydration  . GERD (gastroesophageal reflux disease)   . IBS (irritable bowel syndrome)   . Hiatal hernia   . Gastroparesis     99% retention 05/2008 on GES  . Hyperlipidemia   . Hypertension   . Hypothyroid   . Chronic LBP     Surgical intervention in 1996  . Obesity   . OSA on CPAP   . Anemia     H/H of 10/30 with a normal MCV in 12/09  . Barrett's esophagus     Diagnosed 1995. Last EGD 2008.   . Seizures     Past Surgical History  Procedure Laterality Date  . Cardiovascular stress test  2008    Stress nuclear study  . Back surgery  1996  . Laparoscopic cholecystectomy  1990s  . Total abdominal hysterectomy  1999  . Laminectomy  1995    L4-L5  . Carpal tunnel release  1994  . Esophagogastroduodenoscopy  2008    Barrett's without dysplasia. esphagus dilated. antral erosions, h.pylori serologies negative.  . Colonoscopy  11/2011    Dr. Darrick Penna: Internal hemorrhoids, mild diverticulosis. Random colon biopsies negative.  . Givens capsule study  12/07/2011    Proximal small bowel, rare AVM. Distal small bowel, multiple ulcers noted  . Esophagogastroduodenoscopy  11/2011    Dr. Darrick Penna: Barrett's esophagus, mild gastritis, diverticulum in the second portion of the duodenum repeat EGD 3 years. Small bowel  biopsies negative. Gastric biopsy show reactive gastropathy but no H. pylori. Esophageal biopsies consistent with GERD. Next EGD 11/2014  . Givens capsule study N/A 09/27/2013    Distal small bowel ulcers extending to TI.  Marland Kitchen Givens capsule study N/A 10/10/2013    Procedure: GIVENS CAPSULE STUDY;  Surgeon: West Bali, MD;  Location: [facility];  Service: Endoscopy;  Laterality: N/A;  7:30  . Colonoscopy N/A 11/08/2013    SLF: Normal mucosa in the terminal ileum/The colon IS redundant/  Moderate diverticulosis throughout the entire colon. ileum bx benign. colon bx benign  . Esophageal biopsy N/A 11/08/2013    Procedure: BIOPSY  / Tissue sampling / ulcers present in small intestine;  Surgeon: West Bali, MD;  Location: [facility];  Service: Endoscopy;  Laterality: N/A;  . Cardiac catheterization  2002  . Right heart catheterization N/A 01/10/2014    Procedure: RIGHT HEART CATH;  Surgeon: Lesleigh Noe, MD;  Location: [facility] CATH LAB;  Service: Cardiovascular;  Laterality: N/A;  . Left heart catheterization with coronary angiogram  01/10/2014    Procedure: LEFT HEART CATHETERIZATION WITH CORONARY ANGIOGRAM;  Surgeon: Lesleigh Noe, MD;  Location: [facility] CATH LAB;  Service: Cardiovascular;;    Prior to Admission medications   Medication Sig Start Date End Date Taking? Authorizing Provider  carvedilol (COREG) 3.125 MG tablet  Take 1 tablet (3.125 mg total) by mouth 2 (two) times daily. 11/11/14  Yes Antoine Poche, MD  dronabinol (MARINOL) 2.5 MG capsule Take 1 capsule (2.5 mg total) by mouth 2 (two) times daily before a meal. 06/04/14  Yes West Bali, MD  DULoxetine (CYMBALTA) 30 MG capsule Take 30 mg by mouth 2 (two) times daily.   Yes Historical Provider, MD  furosemide (LASIX) 40 MG tablet Take 1 tablet (40 mg total) by mouth 2 (two) times daily. 08/27/14  Yes Dyann Kief, PA-C  glipiZIDE (GLUCOTROL) 10 MG tablet Take 10 mg by mouth daily before breakfast.    Yes Historical Provider,  MD  levETIRAcetam (KEPPRA) 250 MG tablet Take 1 tablet (250 mg total) by mouth 2 (two) times daily. 11/10/12  Yes Gareth Morgan, MD  levothyroxine (SYNTHROID, LEVOTHROID) 137 MCG tablet Take 137 mcg by mouth daily. 10/14/14  Yes Historical Provider, MD  lisinopril (PRINIVIL,ZESTRIL) 2.5 MG tablet Take 1 tablet (2.5 mg total) by mouth daily. 11/11/14  Yes Antoine Poche, MD  loperamide (IMODIUM) 2 MG capsule TAKE (1) CAPSULE THREE TIMES DAILY AS NEEDED FOR DIARRHEA OR LOOSE STOOLS. 07/24/14  Yes Anice Paganini, NP  LORazepam (ATIVAN) 1 MG tablet Take 1-2 mg by mouth daily as needed for anxiety or sleep. For anxiety   Yes Historical Provider, MD  magnesium oxide (MAG-OX) 400 MG tablet Take 1 tablet (400 mg total) by mouth daily. 10/06/14  Yes Antoine Poche, MD  metaxalone (SKELAXIN) 800 MG tablet Take 800 mg by mouth 2 (two) times daily.  10/04/14  Yes Historical Provider, MD  metFORMIN (GLUCOPHAGE) 500 MG tablet Take 1,000 mg by mouth 2 (two) times daily with a meal.    Yes Historical Provider, MD  Multiple Vitamin (MULTIVITAMIN WITH MINERALS) TABS tablet Take 1 tablet by mouth daily.   Yes Historical Provider, MD  omeprazole (PRILOSEC) 20 MG capsule Take 20 mg by mouth 2 (two) times daily.     Yes Historical Provider, MD  ondansetron (ZOFRAN) 4 MG tablet Take 4 mg by mouth every 6 (six) hours as needed. For nausea 03/15/14  Yes Historical Provider, MD  potassium chloride SA (K-DUR,KLOR-CON) 20 MEQ tablet Take 1 tablet (20 mEq total) by mouth 4 (four) times daily. Patient taking differently: Take 20 mEq by mouth 2 (two) times daily.  08/27/14  Yes Dyann Kief, PA-C  vitamin B-12 (CYANOCOBALAMIN) 500 MCG tablet Take 500 mcg by mouth daily.   Yes Historical Provider, MD  ELIQUIS 5 MG TABS tablet TAKE 1 TABLET BY MOUTH TWICE DAILY. 07/24/14   Antoine Poche, MD  ferrous sulfate 325 (65 FE) MG tablet Take 325 mg by mouth daily with breakfast.    Historical Provider, MD  Insulin Glargine (LANTUS  SOLOSTAR) 100 UNIT/ML Solostar Pen Inject 20-50 Units into the skin at bedtime as needed. Patient takes 20 units every night at bedtime if over 178.  Takes 50 units if her blood sugar is above 300.    Historical Provider, MD  nitroGLYCERIN (NITROSTAT) 0.4 MG SL tablet Place 0.4 mg under the tongue every 5 (five) minutes as needed for chest pain. For chest pain     Historical Provider, MD    Allergies as of 10/28/2014 - Review Complete 10/10/2014  Allergen Reaction Noted  . Ibuprofen  10/13/2013  . Celexa [citalopram hydrobromide] Other (See Comments) 06/30/2013  . Codeine Nausea And Vomiting   . Reglan [metoclopramide] Other (See Comments) 06/04/2014    Family History  Problem Relation Age of Onset  . Hypertension Mother   . Alzheimer's disease Mother   . Stroke Mother   . Heart attack Mother   . Heart disease Neg Hx   . Hypertension Other   . Colon cancer Neg Hx     History   Social History  . Marital Status: Married    Spouse Name: N/A  . Number of Children: N/A  . Years of Education: N/A   Occupational History  . 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     Social History Main Topics  . Smoking status: Former Smoker -- 15 years    Start date: 02/26/1966    Quit date: 07/01/1983  . Smokeless tobacco: Never Used     Comment: quit in 1984  . Alcohol Use: No     Comment: last etoh one year ago, never frequent  . Drug Use: No  . Sexual Activity: Yes    Birth Control/ Protection: None, Surgical   Other Topics Concern  . Not on file   Social History Narrative   Sedentary   4 children, "blended family"    Review of Systems: See HPI, otherwise negative ROS   Physical Exam: BP 133/76 mmHg  Pulse 111  Temp(Src) 97.4 F (36.3 C) (Oral)  Resp 18  Ht  (1.676 m)  Wt 188 lb (85.276 kg)  BMI 30.36 kg/m2  SpO2 95% General:   Alert,  pleasant and cooperative in NAD Head:  Normocephalic and atraumatic. Neck:  Supple; Lungs:  Clear throughout to  auscultation.    Heart:  Regular rate and rhythm. Abdomen:  Soft, nontender and nondistended. Normal bowel sounds, without guarding, and without rebound.   Neurologic:  Alert and  oriented x4;  grossly normal neurologically.  Impression/Plan:    Anemia/Barrett's esophagus PLAN:  1. EGD TODAY

## 2014-11-25 ENCOUNTER — Encounter (HOSPITAL_COMMUNITY): Payer: Self-pay | Admitting: Gastroenterology

## 2014-11-29 DIAGNOSIS — I10 Essential (primary) hypertension: Secondary | ICD-10-CM | POA: Diagnosis not present

## 2014-11-29 LAB — BASIC METABOLIC PANEL
BUN: 18 mg/dL (ref 6–23)
CALCIUM: 9 mg/dL (ref 8.4–10.5)
CHLORIDE: 108 meq/L (ref 96–112)
CO2: 25 mEq/L (ref 19–32)
Creat: 0.75 mg/dL (ref 0.50–1.10)
GLUCOSE: 126 mg/dL — AB (ref 70–99)
Potassium: 5 mEq/L (ref 3.5–5.3)
SODIUM: 139 meq/L (ref 135–145)

## 2014-12-04 ENCOUNTER — Telehealth: Payer: Self-pay | Admitting: *Deleted

## 2014-12-04 NOTE — Telephone Encounter (Signed)
-----   Message from Antoine Poche, MD sent at 12/03/2014  2:04 PM EDT ----- Labs look good  Dominga Ferry MD

## 2014-12-04 NOTE — Telephone Encounter (Signed)
Pt made aware, routed to pcp 

## 2014-12-05 ENCOUNTER — Ambulatory Visit (INDEPENDENT_AMBULATORY_CARE_PROVIDER_SITE_OTHER): Payer: Medicare Other | Admitting: Cardiology

## 2014-12-05 ENCOUNTER — Ambulatory Visit: Payer: Medicare Other | Admitting: Cardiology

## 2014-12-05 ENCOUNTER — Encounter: Payer: Self-pay | Admitting: Cardiology

## 2014-12-05 VITALS — BP 116/76 | HR 100 | Ht 66.0 in | Wt 192.0 lb

## 2014-12-05 DIAGNOSIS — I5032 Chronic diastolic (congestive) heart failure: Secondary | ICD-10-CM | POA: Diagnosis not present

## 2014-12-05 DIAGNOSIS — I272 Pulmonary hypertension, unspecified: Secondary | ICD-10-CM

## 2014-12-05 DIAGNOSIS — I482 Chronic atrial fibrillation, unspecified: Secondary | ICD-10-CM

## 2014-12-05 DIAGNOSIS — I270 Primary pulmonary hypertension: Secondary | ICD-10-CM

## 2014-12-05 MED ORDER — CARVEDILOL 6.25 MG PO TABS
6.2500 mg | ORAL_TABLET | Freq: Two times a day (BID) | ORAL | Status: DC
Start: 2014-12-05 — End: 2015-01-29

## 2014-12-05 MED ORDER — FUROSEMIDE 20 MG PO TABS: 20.0000 mg | ORAL_TABLET | Freq: Two times a day (BID) | ORAL | Status: DC

## 2014-12-05 NOTE — Patient Instructions (Signed)
Your physician recommends that you schedule a follow-up appointment in: 2 MONTHS  INCREASED YOUR COREG to 6.25 MG TWICE DAILY  DECREASED YOUR LASIX to 20 mg twice daily  Thanks for choosing [facility]!!!

## 2014-12-05 NOTE — Progress Notes (Signed)
Clinical Summary [name] is a 64 y.o.female seen today for follow up of the following medical problems.   1. Chronicsystolic/ diastolic CHF - echo 10/2014 LVEF 40%, afib cannot eval diastolic function but severe biatrial enlargement suggests significant diastolic dysfunction.  - since last visit swelling has been up and down. Taking lasix 1-2 times a day, taking her lasix makes her feel drained and worn out and she will sometimes skip doses - home weightsstable at 188 lbs. She notes some ncreased distension in abdomen.    2. Pulmonary Hypertension - echo 12/2013 PASP 85, moderate TR, RV mild to moderately dilated with normal function, could not eval diasotlic function due to afib, LVEF 50-55%. Biatrial enlargement.  - RHC 01/2015 PA 38/13 and calculated mean of 21, could not get a wedge however LVEDP 15. - repeat echo 10/2014 PASP 61 mmHg (down from 85 on prior echo), some evidence of RV dysfunction with RV TAPSE 1.4 and tissue anular systolic velocity of 7.5. Cannot evaluate diastolic function, however severe biatrial enlarement suggests significant dysfunction,   3. Hypothyroidism - followed by Dr [name]  4. Afib - + palpitations daily - compliant with metoprolol and eliquis  5. Ascites - followed by GI, recent US 10/2014 with scattered mild ascites without large pocket. 08/2014 CT scan  Past Medical History  Diagnosis Date  . Chest pain     Negative cardiac catheterization in 2002; negative stress nuclear study in 2008  . Congestive heart failure     Normal LV systolic function  . PAF (paroxysmal atrial fibrillation) 2009  . Chronic anticoagulation   . Diabetes mellitus, type 2     Insulin therapy; exacerbated by prednisone  . Syncope     Admitted 05/2009; magnetic resonance imagin/ MRA - negative; etiology thought to be orthostasis secondary to drugs and dehydration  . GERD (gastroesophageal reflux disease)   . IBS (irritable bowel syndrome)   . Hiatal hernia   .  Gastroparesis     99% retention 05/2008 on GES  . Hyperlipidemia   . Hypertension   . Hypothyroid   . Chronic LBP     Surgical intervention in 1996  . Obesity   . OSA on CPAP   . Anemia     H/H of 10/30 with a normal MCV in 12/09  . Barrett's esophagus     Diagnosed 1995. Last EGD 2016-NO BARRETT'S.   . Seizures      Allergies  Allergen Reactions  . Ibuprofen     Upsets her stomach and causes abdominal pain  . Celexa [Citalopram Hydrobromide] Other (See Comments)    Dyskinesia  . Codeine Nausea And Vomiting    HALLUCINATIONS  . Reglan [Metoclopramide] Other (See Comments)    DYSKINESIA     Current Outpatient Prescriptions  Medication Sig Dispense Refill  . carvedilol (COREG) 3.125 MG tablet Take 1 tablet (3.125 mg total) by mouth 2 (two) times daily. 180 tablet 3  . dicyclomine (BENTYL) 10 MG capsule 1 po 30 mins before meals up to three times a day or q4h prn for abdominal pain/diarrhea. NO MORE 8 DOSES IN A DAY. 45 capsule 11  . dronabinol (MARINOL) 2.5 MG capsule Take 1 capsule (2.5 mg total) by mouth 2 (two) times daily before a meal. 60 capsule 0  . DULoxetine (CYMBALTA) 30 MG capsule Take 30 mg by mouth 2 (two) times daily.    Marland Kitchen ELIQUIS 5 MG TABS tablet TAKE 1 TABLET BY MOUTH TWICE DAILY. 60 tablet  3  . ferrous sulfate 325 (65 FE) MG tablet Take 1 tablet (325 mg total) by mouth 2 (two) times daily with a meal. 60 tablet 11  . furosemide (LASIX) 40 MG tablet Take 1 tablet (40 mg total) by mouth 2 (two) times daily. 180 tablet 3  . glipiZIDE (GLUCOTROL) 10 MG tablet Take 10 mg by mouth daily before breakfast.     . Insulin Glargine (LANTUS SOLOSTAR) 100 UNIT/ML Solostar Pen Inject 20-50 Units into the skin at bedtime as needed. Patient takes 20 units every night at bedtime if over 178.  Takes 50 units if her blood sugar is above 300.    . levETIRAcetam (KEPPRA) 250 MG tablet Take 1 tablet (250 mg total) by mouth 2 (two) times daily. 60 tablet 11  . levothyroxine  (SYNTHROID, LEVOTHROID) 137 MCG tablet Take 137 mcg by mouth daily.    Marland Kitchen lisinopril (PRINIVIL,ZESTRIL) 2.5 MG tablet Take 1 tablet (2.5 mg total) by mouth daily. 90 tablet 3  . loperamide (IMODIUM) 2 MG capsule TAKE (1) CAPSULE THREE TIMES DAILY AS NEEDED FOR DIARRHEA OR LOOSE STOOLS. 30 capsule 5  . LORazepam (ATIVAN) 1 MG tablet Take 1-2 mg by mouth daily as needed for anxiety or sleep. For anxiety    . magnesium oxide (MAG-OX) 400 MG tablet Take 1 tablet (400 mg total) by mouth daily. 30 tablet 6  . metaxalone (SKELAXIN) 800 MG tablet Take 800 mg by mouth 2 (two) times daily.     . metFORMIN (GLUCOPHAGE) 500 MG tablet Take 1,000 mg by mouth 2 (two) times daily with a meal.     . Multiple Vitamin (MULTIVITAMIN WITH MINERALS) TABS tablet Take 1 tablet by mouth daily.    . nitroGLYCERIN (NITROSTAT) 0.4 MG SL tablet Place 0.4 mg under the tongue every 5 (five) minutes as needed for chest pain. For chest pain     . omeprazole (PRILOSEC) 20 MG capsule Take 20 mg by mouth 2 (two) times daily.      . ondansetron (ZOFRAN) 4 MG tablet Take 4 mg by mouth every 6 (six) hours as needed. For nausea    . potassium chloride SA (K-DUR,KLOR-CON) 20 MEQ tablet Take 1 tablet (20 mEq total) by mouth 4 (four) times daily. (Patient taking differently: Take 20 mEq by mouth 2 (two) times daily. ) 120 tablet 11  . vitamin B-12 (CYANOCOBALAMIN) 500 MCG tablet Take 500 mcg by mouth daily.     No current facility-administered medications for this visit.     Past Surgical History  Procedure Laterality Date  . Cardiovascular stress test  2008    Stress nuclear study  . Back surgery  1996  . Laparoscopic cholecystectomy  1990s  . Total abdominal hysterectomy  1999  . Laminectomy  1995    L4-L5  . Carpal tunnel release  1994  . Esophagogastroduodenoscopy  2008    Barrett's without dysplasia. esphagus dilated. antral erosions, h.pylori serologies negative.  . Colonoscopy  11/2011    Dr. Darrick Penna: Internal hemorrhoids,  mild diverticulosis. Random colon biopsies negative.  . Givens capsule study  12/07/2011    Proximal small bowel, rare AVM. Distal small bowel, multiple ulcers noted  . Esophagogastroduodenoscopy  11/2011    Dr. Darrick Penna: Barrett's esophagus, mild gastritis, diverticulum in the second portion of the duodenum repeat EGD 3 years. Small bowel biopsies negative. Gastric biopsy show reactive gastropathy but no H. pylori. Esophageal biopsies consistent with GERD. Next EGD 11/2014  . Givens capsule study N/A 09/27/2013  Distal small bowel ulcers extending to TI.  Marland Kitchen Givens capsule study N/A 10/10/2013    Procedure: GIVENS CAPSULE STUDY;  Surgeon: West Bali, MD;  Location: [facility];  Service: Endoscopy;  Laterality: N/A;  7:30  . Colonoscopy N/A 11/08/2013    SLF: Normal mucosa in the terminal ileum/The colon IS redundant/  Moderate diverticulosis throughout the entire colon. ileum bx benign. colon bx benign  . Esophageal biopsy N/A 11/08/2013    Procedure: BIOPSY  / Tissue sampling / ulcers present in small intestine;  Surgeon: West Bali, MD;  Location: [facility];  Service: Endoscopy;  Laterality: N/A;  . Cardiac catheterization  2002  . Right heart catheterization N/A 01/10/2014    Procedure: RIGHT HEART CATH;  Surgeon: Lesleigh Noe, MD;  Location: [facility] CATH LAB;  Service: Cardiovascular;  Laterality: N/A;  . Left heart catheterization with coronary angiogram  01/10/2014    Procedure: LEFT HEART CATHETERIZATION WITH CORONARY ANGIOGRAM;  Surgeon: Lesleigh Noe, MD;  Location: [facility] CATH LAB;  Service: Cardiovascular;;  . Esophagogastroduodenoscopy N/A 11/21/2014    Procedure: ESOPHAGOGASTRODUODENOSCOPY (EGD);  Surgeon: West Bali, MD;  Location: [facility];  Service: Endoscopy;  Laterality: N/A;  12:00 - moved to 12:45 - office to notify     Allergies  Allergen Reactions  . Ibuprofen     Upsets her stomach and causes abdominal pain  . Celexa [Citalopram Hydrobromide] Other (See  Comments)    Dyskinesia  . Codeine Nausea And Vomiting    HALLUCINATIONS  . Reglan [Metoclopramide] Other (See Comments)    DYSKINESIA      Family History  Problem Relation Age of Onset  . Hypertension Mother   . Alzheimer's disease Mother   . Stroke Mother   . Heart attack Mother   . Heart disease Neg Hx   . Hypertension Other   . Colon cancer Neg Hx      Social History [name] reports that she quit smoking about 31 years ago. She started smoking about 48 years ago. She has never used smokeless tobacco. Ms. Kizer reports that she does not drink alcohol.   Review of Systems CONSTITUTIONAL: No weight loss, fever, chills, weakness or fatigue.  HEENT: Eyes: No visual loss, blurred vision, double vision or yellow sclerae.No hearing loss, sneezing, congestion, runny nose or sore throat.  SKIN: No rash or itching.  CARDIOVASCULAR: per HPI RESPIRATORY: No shortness of breath, cough or sputum.  GASTROINTESTINAL: No anorexia, nausea, vomiting or diarrhea. No abdominal pain or blood.  GENITOURINARY: No burning on urination, no polyuria NEUROLOGICAL: No headache, dizziness, syncope, paralysis, ataxia, numbness or tingling in the extremities. No change in bowel or bladder control.  MUSCULOSKELETAL: No muscle, back pain, joint pain or stiffness.  LYMPHATICS: No enlarged nodes. No history of splenectomy.  PSYCHIATRIC: No history of depression or anxiety.  ENDOCRINOLOGIC: No reports of sweating, cold or heat intolerance. No polyuria or polydipsia.  Marland Kitchen   Physical Examination Filed Vitals:   12/05/14 1050  BP: 116/76  Pulse: 100   Filed Vitals:   12/05/14 1050  Height: 5\' 6"  (1.676 m)  Weight: 192 lb (87.091 kg)    Gen: resting comfortably, no acute distress HEENT: no scleral icterus, pupils equal round and reactive, no palptable cervical adenopathy,  CV: irreg, no m/r/g, no JVD Resp: Clear to auscultation bilaterally GI: +distension MSK: extremities are warm, no edema.    Skin: warm, no rash Neuro:  no focal deficits Psych: appropriate affect  Diagnostic Studies  12/2013 Echo Study Conclusions  - Limited study to evaluate LV systolic function. - Left ventricle: The cavity size was normal. Wall thickness was normal. Systolic function was normal. The estimated ejection fraction was in the range of 50% to 55%. Wall motion was normal; there were no regional wall motion abnormalities. The study was not technically sufficient to allow evaluation of LV diastolic dysfunction due to atrial fibrillation. - Aortic valve: Valve area (VTI): 2.49 cm^2. Valve area (Vmax): 2.11 cm^2. - Mitral valve: There was moderate regurgitation. The MR vena contracta is 0.4 cm. - Left atrium: The atrium was severely dilated. - Right ventricle: The cavity size was mildly to moderately dilated. - Right atrium: The atrium was severely dilated. - Atrial septum: From available images no evidence of ASD or PFO by 2D or color Doppler images. - Tricuspid valve: There was moderate regurgitation. - Pulmonary arteries: Systolic pressure was severely increased. PA peak pressure: 85 mm Hg (S).  01/2014 RHC HEMODYNAMICS: Aortic pressure 123/73 mmHg; LV pressure 122/6 mmHg; LVEDP 15 mm mercury; RA 4 mmHg; RV 31/5; PA 38/13 mmHg; PCWP(mean) unable to wedge the balloon tip catheter; Cardiac Output by Fick 4.73 L per minute  ANGIOGRAPHIC DATA: The left main coronary artery is normal.  The left anterior descending artery is widely patent, traverses the LV apex, gives origin to 2 diagonals, and has no significant obstruction..  The left circumflex artery is widely patent and gives a single large obtuse marginal Lisa Milian that trifurcates on the lateral wall. No significant obstruction.  The right coronary artery is dominant, gives 3 left ventricular branches and a large bifurcating PDA. No obstructions noted.Marland Kitchen  LEFT VENTRICULOGRAM: Left ventricular angiogram was done  in the 30 RAO projection and revealed normal LV cavity size, tachycardia due to atrial fibrillation, and mild global reduction in LV function with an estimated ejection fraction of 40%   IMPRESSIONS: 1. Widely patent coronary arteries 2. Mild pulmonary hypertension 3. Mildly depressed LV systolic function with an estimated EF of 40% 3. This did not confirm significant pulmonary hypertension. The procedure did suggest that the right atrium and right ventricle were enlarged as we had difficulty manipulating the right heart catheter into the pulmonary artery.   RECOMMENDATION: Back to the outpatient area at home later today if no complications. She should not resume eloquence until 24 hours post procedure..  10/2014 Echo Study Conclusions  - Left ventricle: The cavity size was normal. Wall thickness was increased in a pattern of mild LVH. There was mild concentric hypertrophy. The estimated ejection fraction was 40%. Diffuse hypokinesis. - Mitral valve: Calcified annulus. There was mild regurgitation. - Left atrium: The atrium was severely dilated. - Right atrium: The atrium was severely dilated. - Atrial septum: Thinned and stretched but no obvious PFO. No defect or patent foramen ovale was identified. - Tricuspid valve: There was moderate regurgitation. - Impressions: E/E&' not reported and in afib diastolic evaluation not possible.  Impressions:  - E/E&' not reported and in afib diastolic evaluation not possible.     Assessment and Plan  1. Chronic systolic/diastolic HF - weights stable, but increased abdominal distension. She was evaluated for paracentesis but no large pockets - she skips lasix doses b/c after taking she feels drained. Will try changing to 20mg  bid to see if increases compliance   2. Pulmonary HTN - severe by echo, RHC did not show significantly elevated pressures. From notes technically unable to obtain PCWP, unclear if technical issues with  rest of  RHC. Abdominal and LE swelling improving but still present - Unclear why such large discrepancy between echo and RHC. Possibilities included technical issues with RHC, severely elevated left sided pressures at time of echo due to acute diastolic HF, or worsening RV function with corresponding pulm pressure drop.  - repeat echo shows PASP 61, evidence of right sided dysfunction. Will discuss possibly repeating her RHC, will discuss with CHF team.   3. Hypothyroidism - followed by Dr [name]  4. Hepatomegaly - continue diuretics  5. Afib - increased palpitations recently, will increase coreg to 6.25mg  bid  F/u 2 months  Antoine Poche, M.D.

## 2014-12-10 ENCOUNTER — Other Ambulatory Visit: Payer: Self-pay

## 2014-12-10 ENCOUNTER — Telehealth: Payer: Self-pay | Admitting: Gastroenterology

## 2014-12-10 DIAGNOSIS — D509 Iron deficiency anemia, unspecified: Secondary | ICD-10-CM

## 2014-12-10 NOTE — Progress Notes (Signed)
Quick Note:  EGD findings noted.  Patient is going to follow up with [facility] in 03/2015. Let's check CBC, ferritin in 4 weeks. Continue fusion plus.  Call if overt GI bleeding. ______

## 2014-12-10 NOTE — Telephone Encounter (Signed)
PLEASE CALL PT. HER IRON STORES ARE LOW. SINCE 2008, SHE HAS HAD 3 UPPER ENDOSCOPIES, 2 COLONOSCOPIES,A ND 2 CAPSULE STUDIES. NO ADDITIONAL GI WORKUP IS AVAILABLE. SHE NEEDS A REFERRAL TO HEMATOLOGY TO GET IV IRON INFUSIONS. THESE WILL HELP KEEP HER BLOOD BUILT UP WITHOUT EXPOSING HER TO RISK S FROM BLOOD TRANSFUSIONS. OPV IN 4 MOS E30 ANEMIA, IBS-D, GASTROPARESIS.

## 2014-12-10 NOTE — Progress Notes (Signed)
Quick Note:  Called and informed [name]. Lab orders on file for 01/07/2015. ______

## 2014-12-11 ENCOUNTER — Other Ambulatory Visit: Payer: Self-pay

## 2014-12-11 DIAGNOSIS — D649 Anemia, unspecified: Secondary | ICD-10-CM

## 2014-12-11 NOTE — Telephone Encounter (Signed)
Recall made 

## 2014-12-11 NOTE — Telephone Encounter (Signed)
Referral has been made.

## 2014-12-11 NOTE — Telephone Encounter (Signed)
[name] is aware.  

## 2014-12-12 ENCOUNTER — Telehealth: Payer: Self-pay | Admitting: Cardiology

## 2014-12-12 DIAGNOSIS — E1165 Type 2 diabetes mellitus with hyperglycemia: Secondary | ICD-10-CM | POA: Diagnosis not present

## 2014-12-12 DIAGNOSIS — K633 Ulcer of intestine: Secondary | ICD-10-CM | POA: Diagnosis not present

## 2014-12-12 DIAGNOSIS — R634 Abnormal weight loss: Secondary | ICD-10-CM | POA: Diagnosis not present

## 2014-12-12 DIAGNOSIS — D509 Iron deficiency anemia, unspecified: Secondary | ICD-10-CM | POA: Diagnosis not present

## 2014-12-12 NOTE — Telephone Encounter (Signed)
eliquis 5 mg BID samples give,signed out

## 2014-12-12 NOTE — Telephone Encounter (Signed)
pls call concerning Eloquis samples

## 2014-12-15 IMAGING — CR DG LUMBAR SPINE COMPLETE 4+V
6 series · 6 of 6 positions shown · non-contrast
Comparison: Post myelogram CT 09/18/2008.

CLINICAL DATA: Low back pain for a month.  No known injury.
Surgery 1220.

LUMBAR SPINE - COMPLETE 4+ VIEW

[view not recorded (1 of 6)]
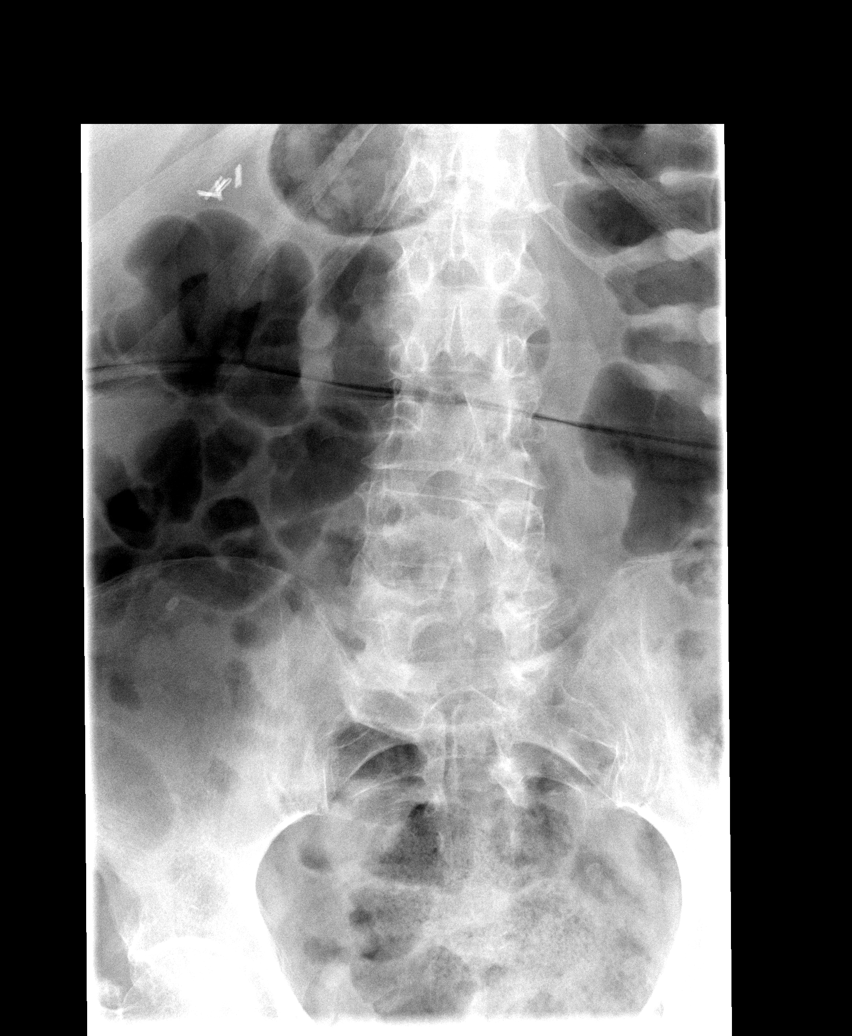

[view not recorded (2 of 6)]
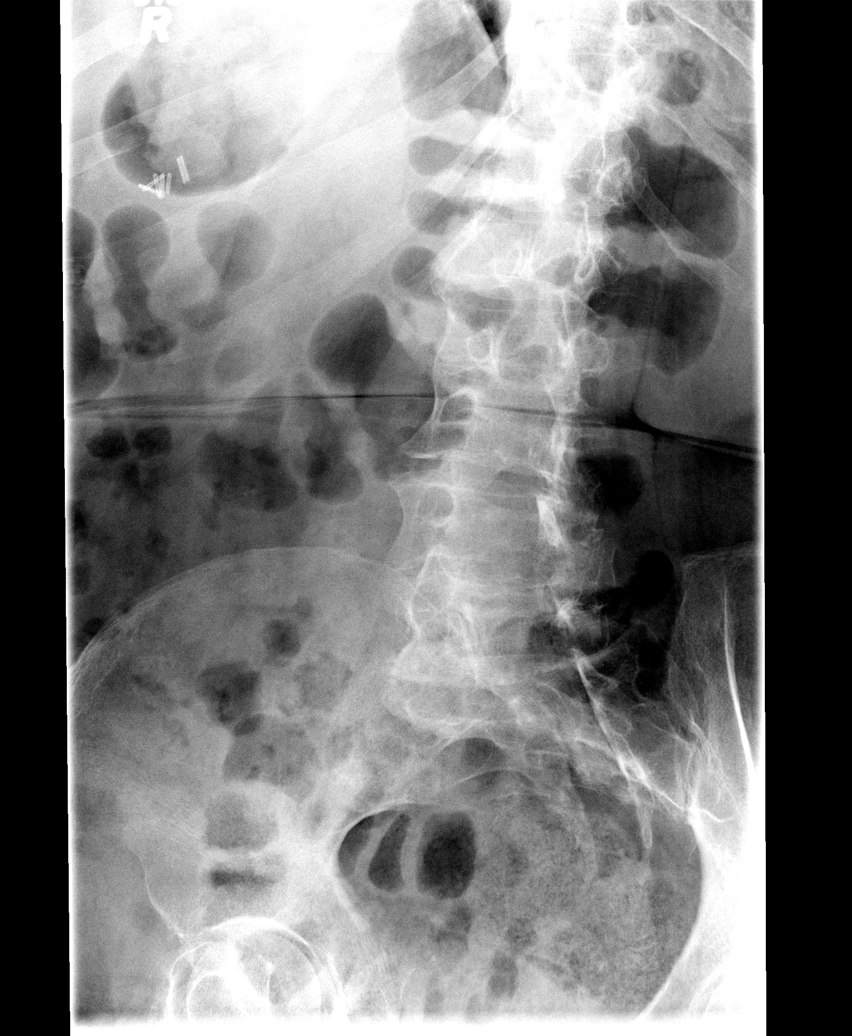

[view not recorded (3 of 6)]
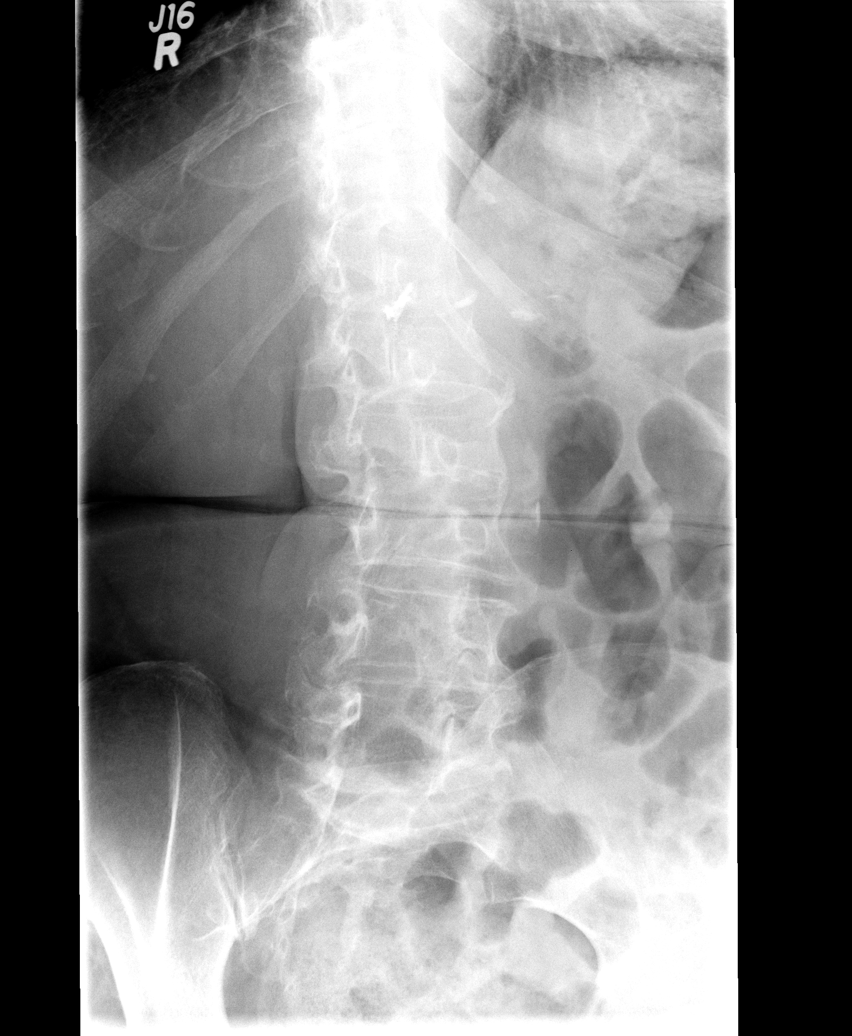

[view not recorded (4 of 6)]
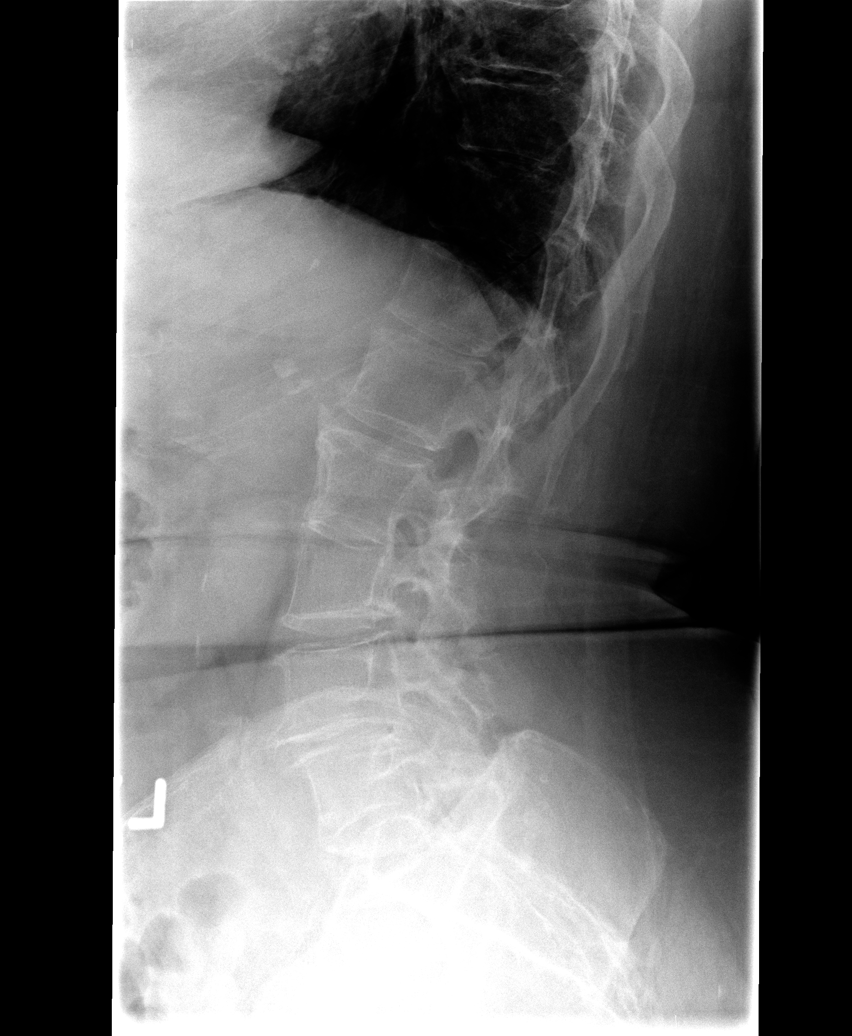

[view not recorded (5 of 6)]
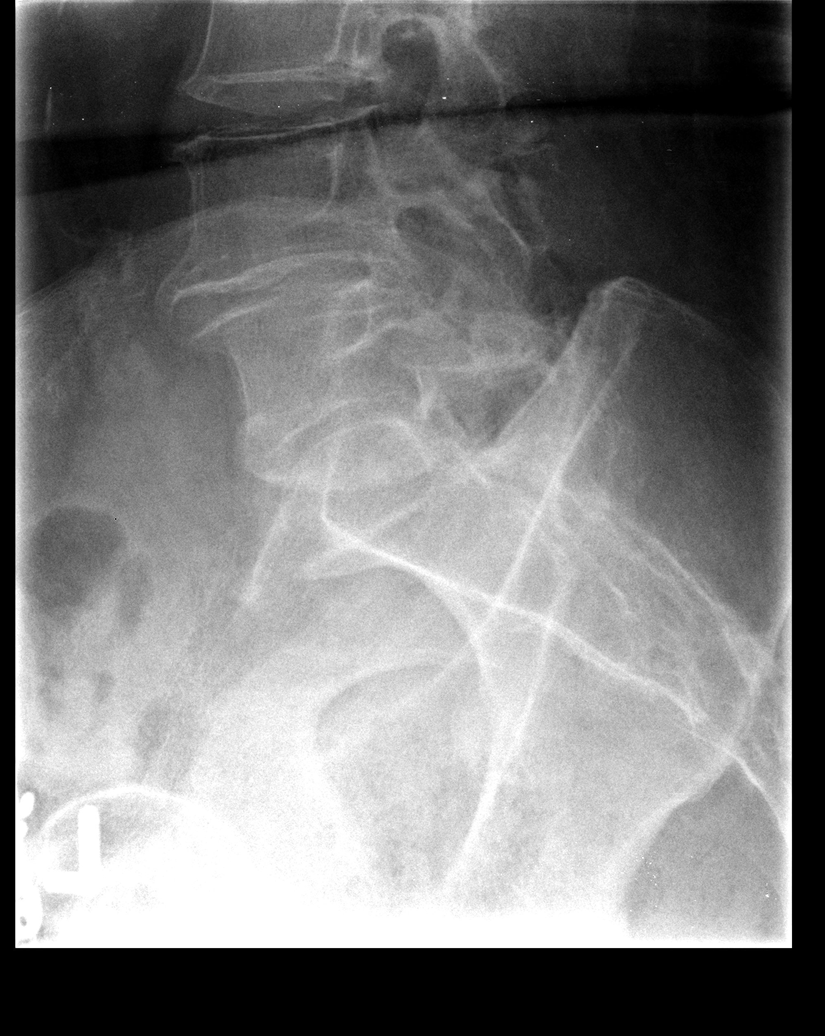

[view not recorded (6 of 6)]
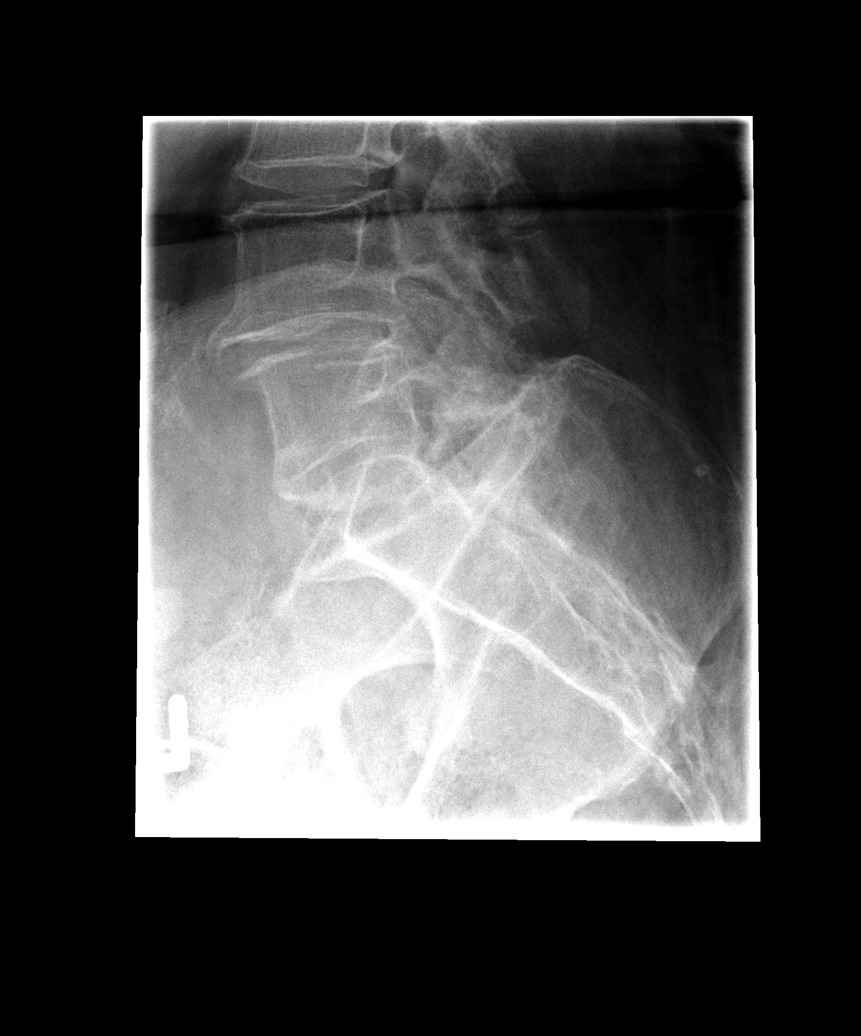

[6 of 6 positions shown; findings below may reference images not displayed]

FINDINGS: Congenital fusion L2-3.

Mild L3-4 and moderate L4-5 disc space narrowing with osteophyte
larger at the L4-5 level.

Facet joint degenerative changes greatest on the left at the L4-5
level.

Vascular calcifications.
IMPRESSION: [Congenital fusion L2-3.

Mild L3-4 and moderate L4-5 disc space narrowing with osteophyte
larger at the L4-5 level.

Facet joint degenerative changes greatest on the left at the L4-5
level.

## 2014-12-19 ENCOUNTER — Other Ambulatory Visit: Payer: Self-pay

## 2014-12-19 DIAGNOSIS — D509 Iron deficiency anemia, unspecified: Secondary | ICD-10-CM

## 2014-12-22 DIAGNOSIS — F09 Unspecified mental disorder due to known physiological condition: Secondary | ICD-10-CM | POA: Diagnosis not present

## 2014-12-22 DIAGNOSIS — G40309 Generalized idiopathic epilepsy and epileptic syndromes, not intractable, without status epilepticus: Secondary | ICD-10-CM | POA: Diagnosis not present

## 2014-12-22 DIAGNOSIS — M797 Fibromyalgia: Secondary | ICD-10-CM | POA: Diagnosis not present

## 2014-12-22 DIAGNOSIS — E538 Deficiency of other specified B group vitamins: Secondary | ICD-10-CM | POA: Diagnosis not present

## 2014-12-24 ENCOUNTER — Encounter (HOSPITAL_COMMUNITY): Payer: Medicare Other | Attending: Oncology | Admitting: Oncology

## 2014-12-24 ENCOUNTER — Encounter (HOSPITAL_COMMUNITY): Payer: Self-pay | Admitting: Oncology

## 2014-12-24 VITALS — BP 117/77 | HR 81 | Temp 98.8°F | Resp 18 | Wt 189.6 lb

## 2014-12-24 DIAGNOSIS — Z87891 Personal history of nicotine dependence: Secondary | ICD-10-CM | POA: Diagnosis not present

## 2014-12-24 DIAGNOSIS — I4891 Unspecified atrial fibrillation: Secondary | ICD-10-CM

## 2014-12-24 DIAGNOSIS — D509 Iron deficiency anemia, unspecified: Secondary | ICD-10-CM

## 2014-12-24 DIAGNOSIS — R160 Hepatomegaly, not elsewhere classified: Secondary | ICD-10-CM

## 2014-12-24 DIAGNOSIS — R188 Other ascites: Secondary | ICD-10-CM | POA: Diagnosis not present

## 2014-12-24 MED ORDER — POLYSACCHARIDE IRON COMPLEX 150 MG PO CAPS: 150.0000 mg | ORAL_CAPSULE | Freq: Two times a day (BID) | ORAL | Status: DC

## 2014-12-24 NOTE — Progress Notes (Signed)
..  [name]'s reason for visit today are for labs as scheduled per MD orders.  Venipuncture performed with a 23 gauge butterfly needle to L Antecubital.  [name] tolerated venipuncture well and without incident; questions were answered and patient was discharged.

## 2014-12-24 NOTE — Patient Instructions (Signed)
..  [facility] at [facility] Discharge Instructions  RECOMMENDATIONS MADE BY THE CONSULTANT AND ANY TEST RESULTS WILL BE SENT TO YOUR REFERRING PHYSICIAN.  Labs today  Return in 3 - 4 weeks  Thank you for choosing [facility] at [facility] to provide your oncology and hematology care.  To afford each patient quality time with our provider, please arrive at least 15 minutes before your scheduled appointment time.    You need to re-schedule your appointment should you arrive 10 or more minutes late.  We strive to give you quality time with our providers, and arriving late affects you and other patients whose appointments are after yours.  Also, if you no show three or more times for appointments you may be dismissed from the clinic at the providers discretion.     Again, thank you for choosing [facility].  Our hope is that these requests will decrease the amount of time that you wait before being seen by our physicians.       _____________________________________________________________  Should you have questions after your visit to [facility], please contact our office at [phone] between the hours of 8:30 a.m. and 4:30 p.m.  Voicemails left after 4:30 p.m. will not be returned until the following business day.  For prescription refill requests, have your pharmacy contact our office.

## 2014-12-24 NOTE — Progress Notes (Signed)
[facility] Hematology/Oncology Consultation   Name: [name]      MRN: [id number]    Location: Room/bed info not found  Date: 12/24/2014 Time:7:05 PM   REFERRING PHYSICIAN:  Jonette Eva, MD  REASON FOR CONSULT:  Iron deficiency anemia   DIAGNOSIS:  Microcytic, hypochromic anemia in the setting of a normal platelet count and WBC count with a recent ferritin of 7 on 11/21/2014.  HISTORY OF PRESENT ILLNESS:   [name] is a 64 yo white American woman with a past medical history significant for IBS, hypothyroidism, hyperlipidemia, gastroparesis, HTN, DM type 2, CHF, and A-Fib was referred to [facility] for iron deficiency anemia after an extensive GI work-up that is negative including colonoscopy (11/08/2013), EGD (11/21/2014), and camera capsule study.   I personally reviewed and went over laboratory results with the patient.  The results are noted within this dictation.  Mild anemia dating back to at least 2009 but frank microcytosis and hypochromosis began in March 2016.  I personally reviewed and went over radiographic studies with the patient.  The results are noted within this dictation.  She had a CT abd performed on 08/25/2014 demonstrating third spacing fluid, prominent hepatic veins, and hepatomegaly spanning 21 cm.   Chart is reviewed.  Colonoscopy, EGD, and Camera Capsule study reviewed without any cause of iron deficiency anemia identified.  She was subsequently referred to [place].  She reports that she started an iron pill back in Jan 2016.  She has been on it since and has noted darkening of her stools.  She denies any blood in her stool.  She denies any ice cravings, "but I use ice in my water."  She denies any clay cravings or eating.  She denies any starch cravings as well.  She denies any B symptoms.  Her appetite is stable, but goes up and down.  She admits to RLQ abdominal discomfort.  She denies any hematuria, gingival bleeding, and vaginal bleeding.     Recently, she was found to have ascites and this has not yet been fully explained.  She is now on diuretics for this.  It has been evaluated for paracentesis, but radiology reports that there is not a large pocket of fluid and a paracentesis would be futile.     PAST MEDICAL HISTORY:   Past Medical History  Diagnosis Date  . Chest pain     Negative cardiac catheterization in 2002; negative stress nuclear study in 2008  . Congestive heart failure     Normal LV systolic function  . PAF (paroxysmal atrial fibrillation) 2009  . Chronic anticoagulation   . Diabetes mellitus, type 2     Insulin therapy; exacerbated by prednisone  . Syncope     Admitted 05/2009; magnetic resonance imagin/ MRA - negative; etiology thought to be orthostasis secondary to drugs and dehydration  . GERD (gastroesophageal reflux disease)   . IBS (irritable bowel syndrome)   . Hiatal hernia   . Gastroparesis     99% retention 05/2008 on GES  . Hyperlipidemia   . Hypertension   . Hypothyroid   . Chronic LBP     Surgical intervention in 1996  . Obesity   . OSA on CPAP   . Anemia     H/H of 10/30 with a normal MCV in 12/09  . Barrett's esophagus     Diagnosed 1995. Last EGD 2016-NO BARRETT'S.   . Seizures     ALLERGIES: Allergies  Allergen Reactions  . Ibuprofen     Upsets her stomach and causes abdominal pain  . Celexa [Citalopram Hydrobromide] Other (See Comments)    Dyskinesia  . Codeine Nausea And Vomiting    HALLUCINATIONS  . Reglan [Metoclopramide] Other (See Comments)    DYSKINESIA      MEDICATIONS: I have reviewed the patient's current medications.    Current Outpatient Prescriptions on File Prior to Visit  Medication Sig Dispense Refill  . carvedilol (COREG) 6.25 MG tablet Take 1 tablet (6.25 mg total) by mouth 2 (two) times daily. 180 tablet 3  . dicyclomine (BENTYL) 10 MG capsule 1 po 30 mins before meals up to three times a day or q4h prn for abdominal pain/diarrhea. NO MORE 8 DOSES  IN A DAY. 45 capsule 11  . dronabinol (MARINOL) 2.5 MG capsule Take 1 capsule (2.5 mg total) by mouth 2 (two) times daily before a meal. 60 capsule 0  . DULoxetine (CYMBALTA) 30 MG capsule Take 30 mg by mouth 2 (two) times daily.    Marland Kitchen ELIQUIS 5 MG TABS tablet TAKE 1 TABLET BY MOUTH TWICE DAILY. 60 tablet 3  . furosemide (LASIX) 20 MG tablet Take 1 tablet (20 mg total) by mouth 2 (two) times daily. 180 tablet 3  . glipiZIDE (GLUCOTROL) 10 MG tablet Take 10 mg by mouth daily before breakfast.     . Insulin Glargine (LANTUS SOLOSTAR) 100 UNIT/ML Solostar Pen Inject 20-50 Units into the skin at bedtime as needed. Patient takes 20 units every night at bedtime if over 178.  Takes 50 units if her blood sugar is above 300.    . levETIRAcetam (KEPPRA) 250 MG tablet Take 1 tablet (250 mg total) by mouth 2 (two) times daily. 60 tablet 11  . levothyroxine (SYNTHROID, LEVOTHROID) 137 MCG tablet Take 137 mcg by mouth daily.    Marland Kitchen lisinopril (PRINIVIL,ZESTRIL) 2.5 MG tablet Take 1 tablet (2.5 mg total) by mouth daily. 90 tablet 3  . loperamide (IMODIUM) 2 MG capsule TAKE (1) CAPSULE THREE TIMES DAILY AS NEEDED FOR DIARRHEA OR LOOSE STOOLS. 30 capsule 5  . LORazepam (ATIVAN) 1 MG tablet Take 1-2 mg by mouth daily as needed for anxiety or sleep. For anxiety    . magnesium oxide (MAG-OX) 400 MG tablet Take 1 tablet (400 mg total) by mouth daily. 30 tablet 6  . metaxalone (SKELAXIN) 800 MG tablet Take 800 mg by mouth 2 (two) times daily.     . metFORMIN (GLUCOPHAGE) 500 MG tablet Take 1,000 mg by mouth 2 (two) times daily with a meal.     . Multiple Vitamin (MULTIVITAMIN WITH MINERALS) TABS tablet Take 1 tablet by mouth daily.    Marland Kitchen omeprazole (PRILOSEC) 20 MG capsule Take 20 mg by mouth 2 (two) times daily.      . ondansetron (ZOFRAN) 4 MG tablet Take 4 mg by mouth every 6 (six) hours as needed. For nausea    . vitamin B-12 (CYANOCOBALAMIN) 500 MCG tablet Take 500 mcg by mouth daily.    . nitroGLYCERIN (NITROSTAT)  0.4 MG SL tablet Place 0.4 mg under the tongue every 5 (five) minutes as needed for chest pain. For chest pain     . potassium chloride SA (K-DUR,KLOR-CON) 20 MEQ tablet Take 1 tablet (20 mEq total) by mouth 4 (four) times daily. (Patient not taking: Reported on 12/24/2014) 120 tablet 11   No current facility-administered medications on file prior to visit.     PAST SURGICAL HISTORY Past Surgical History  Procedure Laterality Date  . Cardiovascular stress test  2008    Stress nuclear study  . Back surgery  1996  . Laparoscopic cholecystectomy  1990s  . Total abdominal hysterectomy  1999  . Laminectomy  1995    L4-L5  . Carpal tunnel release  1994  . Esophagogastroduodenoscopy  2008    Barrett's without dysplasia. esphagus dilated. antral erosions, h.pylori serologies negative.  . Colonoscopy  11/2011    Dr. Darrick Penna: Internal hemorrhoids, mild diverticulosis. Random colon biopsies negative.  . Givens capsule study  12/07/2011    Proximal small bowel, rare AVM. Distal small bowel, multiple ulcers noted  . Esophagogastroduodenoscopy  11/2011    Dr. Darrick Penna: Barrett's esophagus, mild gastritis, diverticulum in the second portion of the duodenum repeat EGD 3 years. Small bowel biopsies negative. Gastric biopsy show reactive gastropathy but no H. pylori. Esophageal biopsies consistent with GERD. Next EGD 11/2014  . Givens capsule study N/A 09/27/2013    Distal small bowel ulcers extending to TI.  Marland Kitchen Givens capsule study N/A 10/10/2013    Procedure: GIVENS CAPSULE STUDY;  Surgeon: West Bali, MD;  Location: [facility];  Service: Endoscopy;  Laterality: N/A;  7:30  . Colonoscopy N/A 11/08/2013    SLF: Normal mucosa in the terminal ileum/The colon IS redundant/  Moderate diverticulosis throughout the entire colon. ileum bx benign. colon bx benign  . Esophageal biopsy N/A 11/08/2013    Procedure: BIOPSY  / Tissue sampling / ulcers present in small intestine;  Surgeon: West Bali, MD;  Location: [facility]  ENDO SUITE;  Service: Endoscopy;  Laterality: N/A;  . Cardiac catheterization  2002  . Right heart catheterization N/A 01/10/2014    Procedure: RIGHT HEART CATH;  Surgeon: Lesleigh Noe, MD;  Location: [facility] CATH LAB;  Service: Cardiovascular;  Laterality: N/A;  . Left heart catheterization with coronary angiogram  01/10/2014    Procedure: LEFT HEART CATHETERIZATION WITH CORONARY ANGIOGRAM;  Surgeon: Lesleigh Noe, MD;  Location: [facility] CATH LAB;  Service: Cardiovascular;;  . Esophagogastroduodenoscopy N/A 11/21/2014    Procedure: ESOPHAGOGASTRODUODENOSCOPY (EGD);  Surgeon: West Bali, MD;  Location: [facility] END[facility];  Service: Endoscopy;  Laterality: N/A;  12:00 - moved to 12:45 - office to notify    FAMILY HISTORY: Family History  Problem Relation Age of Onset  . Hypertension Mother   . Alzheimer's disease Mother   . Stroke Mother   . Heart attack Mother   . Heart disease Neg Hx   . Hypertension Other   . Colon cancer Neg Hx     SOCIAL HISTORY:  reports that she quit smoking about 31 years ago. She started smoking about 48 years ago. She has never used smokeless tobacco. She reports that she does not drink alcohol or use illicit drugs.  PERFORMANCE STATUS: The patient's performance status is 1 - Symptomatic but completely ambulatory  PHYSICAL EXAM: Most Recent Vital Signs: Blood pressure 117/77, pulse 81, temperature 98.8 F (37.1 C), temperature source Oral, resp. rate 18, weight 189 lb 9.6 oz (86.002 kg). General appearance: alert, cooperative, appears stated age, fatigued, no distress and moderately obese Head: Normocephalic, without obvious abnormality, atraumatic Eyes: negative findings: lids and lashes normal, conjunctivae and sclerae normal, corneas clear and pupils equal, round, reactive to light and accomodation Throat: lips, mucosa, and tongue normal; teeth and gums normal Neck: no adenopathy, no carotid bruit, no JVD and supple, symmetrical, trachea midline Lungs:  clear to auscultation bilaterally Heart: irregularly irregular rhythm Abdomen: abnormal  findings:  distended and hepatomegaly, liver edge palpable 5 cm below costal margin Extremities: extremities normal, atraumatic, no cyanosis or edema Skin: Skin color, texture, turgor normal. No rashes or lesions Lymph nodes: Cervical, supraclavicular, and axillary nodes normal. Neurologic: Grossly normal  LABORATORY DATA:  CBC    Component Value Date/Time   WBC 6.4 11/11/2014 1625   RBC 3.84* 11/11/2014 1625   RBC 3.77* 06/30/2013 0318   HGB 8.5* 11/11/2014 1625   HCT 29.4* 11/11/2014 1625   PLT 242 11/11/2014 1625   MCV 76.6* 11/11/2014 1625   MCH 22.1* 11/11/2014 1625   MCHC 28.9* 11/11/2014 1625   RDW 19.6* 11/11/2014 1625   LYMPHSABS 2.3 11/11/2014 1625   MONOABS 0.7 11/11/2014 1625   EOSABS 0.1 11/11/2014 1625   BASOSABS 0.0 11/11/2014 1625      Chemistry      Component Value Date/Time   NA 139 11/29/2014 0803   K 5.0 11/29/2014 0803   CL 108 11/29/2014 0803   CO2 25 11/29/2014 0803   BUN 18 11/29/2014 0803   CREATININE 0.75 11/29/2014 0803   CREATININE 0.70 08/25/2014 0830      Component Value Date/Time   CALCIUM 9.0 11/29/2014 0803   CALCIUM 8.4 06/30/2013 0318   ALKPHOS 48 10/17/2014 1219   AST 10 10/17/2014 1219   ALT <8 10/17/2014 1219   BILITOT 0.6 10/17/2014 1219     Lab Results  Component Value Date   IRON 32* 10/04/2014   TIBC 410 10/04/2014   FERRITIN 7* 11/21/2014   Lab Results  Component Value Date   FOLATE >20.0 06/30/2013   Lab Results  Component Value Date   VITAMINB12 701 06/30/2013    RADIOGRAPHY:  CLINICAL DATA: 64 year old diabetic hypertensive female with generalized abdominal pain and swelling for the past month. Nausea, diarrhea and constipation. On prior CT, history of hysterectomy, cholecystectomy and appendectomy. History of small-bowel ulcers. Initial encounter.  EXAM: CT ABDOMEN AND PELVIS WITH  CONTRAST  TECHNIQUE: Multidetector CT imaging of the abdomen and pelvis was performed using the standard protocol following bolus administration of intravenous contrast.  CONTRAST: OMNIPAQUE IOHEXOL 300 MG/ML SOLN  COMPARISON: 09/03/2013 CT.  FINDINGS: Respiratory motion degraded examination.  Ascites. Third spacing of fluid. Etiology indeterminate but may be related to heart dysfunction. Prominent hepatic veins.  Cardiomegaly. Mitral valve calcifications.  Small plural effusions with basilar atelectasis.  Atherosclerotic type changes aorta and branch vessels most notable involving the proximal renal artery bilaterally.  Evaluation for extra luminal bowel inflammatory process is limited given the ascites. No extra luminal free air. Under distended stomach without obvious primary bowel abnormality.  Liver is enlarged spanning over 21 cm. No focal worrisome hepatic lesion. Post cholecystectomy.  No worrisome splenic, pancreatic, renal or adrenal abnormality. Post cholecystectomy.  No adenopathy. Slightly rounded left external iliac node unchanged.  Congenital fusion L2-3. Degenerative changes lower thoracic spine and lumbar spine and most notable L4-5 and L5-S1 level. No osseous destructive lesion.  Post hysterectomy. No adnexal mass identified.  Fatty containing anterior abdominal wall hernia.  IMPRESSION: Respiratory motion degraded examination.  Ascites. Third spacing of fluid. Etiology indeterminate but may be related to heart dysfunction. Prominent hepatic veins.  Cardiomegaly. Mitral valve calcifications.  Small plural effusions with basilar atelectasis.  Atherosclerotic type changes aorta and branch vessels most notable involving the proximal renal artery bilaterally.  Evaluation for extra luminal bowel inflammatory process is limited given the ascites. No extra luminal free air.  Liver is enlarged spanning over 21  cm.  These results will be called to the ordering clinician or representative by the Radiologist Assistant, and communication documented in the PACS or zVision Dashboard.   Electronically Signed  By: Lacy Duverney M.D.  On: 08/25/2014 09:35     ASSESSMENT:  1. Iron deficiency anemia, with ferrous sulfate PO failure.  Taking ferrous sulfate x 6 months.  No improvement in iron studies or Hgb.  S/P extensive GI work-up without any obvious source of iron deficiency anemia. 2. Unexplained hepatomegaly 3. Unexplained ascites 4. LVEF 40%, contributing to #3? 5. A-fib, chronically anticoagulated with Eliquis.   PLAN:  1. I personally reviewed and went over laboratory results with the patient.  The results are noted within this dictation. 2. I personally reviewed and went over radiographic studies with the patient.  The results are noted within this dictation.   3. Chart reviewed 4. Labs today: CBC diff, anemia panel 5. Rx for Niferex BID. 6. IV iron 7. Return in 3 weeks for follow-up   All questions were answered. The patient knows to call the clinic with any problems, questions or concerns. We can certainly see the patient much sooner if necessary.  Patient and plan discussed with Dr. Loma Messing and she is in agreement with the aforementioned.   This note is electronically signed by: Tina Griffiths 12/24/2014 7:05 PM    As above,  patient seen and examined.  I currently see no contraindication for IV iron replacement. We will calculate patient's total iron deficit and arrange for IV replacement. I have also recommended changing her to Niferex which can be better absorbed.  If she has problems with nausea (as this is an occasional problem for her) she will be advised to discontinue. Will f/u in several weeks with repeat CBC and ferritin. Chiquita Loth MD

## 2014-12-25 ENCOUNTER — Encounter (HOSPITAL_COMMUNITY): Payer: Medicare Other

## 2014-12-25 NOTE — Progress Notes (Signed)
Patient r/s for Friday 12/26/2014

## 2014-12-26 ENCOUNTER — Encounter (HOSPITAL_COMMUNITY): Payer: Medicare Other

## 2014-12-26 DIAGNOSIS — D509 Iron deficiency anemia, unspecified: Secondary | ICD-10-CM | POA: Diagnosis not present

## 2014-12-26 LAB — CBC WITH DIFFERENTIAL/PLATELET
BASOS PCT: 1 % (ref 0–1)
Basophils Absolute: 0 10*3/uL (ref 0.0–0.1)
EOS ABS: 0.1 10*3/uL (ref 0.0–0.7)
Eosinophils Relative: 2 % (ref 0–5)
HEMATOCRIT: 31.9 % — AB (ref 36.0–46.0)
Hemoglobin: 9.2 g/dL — ABNORMAL LOW (ref 12.0–15.0)
LYMPHS ABS: 2.1 10*3/uL (ref 0.7–4.0)
Lymphocytes Relative: 37 % (ref 12–46)
MCH: 23.2 pg — ABNORMAL LOW (ref 26.0–34.0)
MCHC: 28.8 g/dL — AB (ref 30.0–36.0)
MCV: 80.6 fL (ref 78.0–100.0)
MONOS PCT: 12 % (ref 3–12)
Monocytes Absolute: 0.7 10*3/uL (ref 0.1–1.0)
Neutro Abs: 2.8 10*3/uL (ref 1.7–7.7)
Neutrophils Relative %: 48 % (ref 43–77)
PLATELETS: 219 10*3/uL (ref 150–400)
RBC: 3.96 MIL/uL (ref 3.87–5.11)
RDW: 19.4 % — AB (ref 11.5–15.5)
WBC: 5.7 10*3/uL (ref 4.0–10.5)

## 2014-12-26 LAB — FERRITIN: FERRITIN: 9 ng/mL — AB (ref 11–307)

## 2014-12-26 LAB — IRON AND TIBC
IRON: 29 ug/dL (ref 28–170)
SATURATION RATIOS: 6 % — AB (ref 10.4–31.8)
TIBC: 458 ug/dL — AB (ref 250–450)
UIBC: 429 ug/dL

## 2014-12-26 LAB — VITAMIN B12: Vitamin B-12: 3696 pg/mL — ABNORMAL HIGH (ref 180–914)

## 2014-12-26 LAB — FOLATE: FOLATE: 46.2 ng/mL (ref >5.9)

## 2014-12-26 NOTE — Progress Notes (Signed)
..  [name]'s reason for visit today are for labs as scheduled per MD orders.  Venipuncture performed with a 23 gauge butterfly needle to R Antecubital.  [name] tolerated venipuncture well and without incident; questions were answered and patient was discharged.

## 2014-12-29 ENCOUNTER — Other Ambulatory Visit (HOSPITAL_COMMUNITY): Payer: Self-pay | Admitting: Oncology

## 2014-12-29 DIAGNOSIS — D509 Iron deficiency anemia, unspecified: Secondary | ICD-10-CM

## 2015-01-02 ENCOUNTER — Encounter (HOSPITAL_COMMUNITY): Payer: Medicare Other | Attending: Hematology & Oncology

## 2015-01-02 VITALS — BP 126/81 | HR 83 | Temp 98.4°F | Resp 16

## 2015-01-02 DIAGNOSIS — D509 Iron deficiency anemia, unspecified: Secondary | ICD-10-CM

## 2015-01-02 DIAGNOSIS — I272 Pulmonary hypertension, unspecified: Secondary | ICD-10-CM

## 2015-01-02 DIAGNOSIS — B351 Tinea unguium: Secondary | ICD-10-CM | POA: Diagnosis not present

## 2015-01-02 DIAGNOSIS — E1142 Type 2 diabetes mellitus with diabetic polyneuropathy: Secondary | ICD-10-CM | POA: Diagnosis not present

## 2015-01-02 HISTORY — DX: Pulmonary hypertension, unspecified: I27.20

## 2015-01-02 MED ORDER — SODIUM CHLORIDE 0.9 % IJ SOLN
10.0000 mL | Freq: Once | INTRAMUSCULAR | Status: AC
  Administered 2015-01-02: 10 mL via INTRAVENOUS

## 2015-01-02 MED ORDER — SODIUM CHLORIDE 0.9 % IV SOLN
510.0000 mg | Freq: Once | INTRAVENOUS | Status: AC
  Administered 2015-01-02: 510 mg via INTRAVENOUS
  Filled 2015-01-02: qty 17

## 2015-01-02 MED ORDER — SODIUM CHLORIDE 0.9 % IV SOLN
INTRAVENOUS | Status: DC
  Administered 2015-01-02: 13:00:00 via INTRAVENOUS

## 2015-01-02 NOTE — Patient Instructions (Signed)
[facility] at [facility] Discharge Instructions  RECOMMENDATIONS MADE BY THE CONSULTANT AND ANY TEST RESULTS WILL BE SENT TO YOUR REFERRING PHYSICIAN.  Feraheme 510 mg infusion today as ordered. Return as scheduled.  Thank you for choosing [facility] at [facility] to provide your oncology and hematology care.  To afford each patient quality time with our provider, please arrive at least 15 minutes before your scheduled appointment time.    You need to re-schedule your appointment should you arrive 10 or more minutes late.  We strive to give you quality time with our providers, and arriving late affects you and other patients whose appointments are after yours.  Also, if you no show three or more times for appointments you may be dismissed from the clinic at the providers discretion.     Again, thank you for choosing [facility].  Our hope is that these requests will decrease the amount of time that you wait before being seen by our physicians.       _____________________________________________________________  Should you have questions after your visit to [facility], please contact our office at [phone] between the hours of 8:30 a.m. and 4:30 p.m.  Voicemails left after 4:30 p.m. will not be returned until the following business day.  For prescription refill requests, have your pharmacy contact our office.    

## 2015-01-02 NOTE — Progress Notes (Signed)
Tolerated iron infusion well. 

## 2015-01-09 ENCOUNTER — Encounter (HOSPITAL_COMMUNITY): Payer: Self-pay | Admitting: *Deleted

## 2015-01-14 ENCOUNTER — Other Ambulatory Visit: Payer: Self-pay | Admitting: *Deleted

## 2015-01-14 ENCOUNTER — Telehealth (HOSPITAL_COMMUNITY): Payer: Self-pay | Admitting: *Deleted

## 2015-01-14 NOTE — Telephone Encounter (Signed)
-----   Message from Laurey Morale, MD sent at 01/07/2015 10:48 AM EDT ----- Yes, does not look like this was ever done.  Will send info to my nurses.  We'll give her a call.   Justine Cossin/Megan: this patient needs RHC for pulmonary hypertension.    ----- Message -----    From: Antoine Poche, MD    Sent: 01/07/2015  10:07 AM      To: Laurey Morale, MD  Rise Mu, just following up on this RHC, I don't believe patient has been scheduled   JB  ----- Message -----    From: Antoine Poche, MD    Sent: 12/09/2014  10:06 AM      To: Laurey Morale, MD  Thx  JB ----- Message -----    From: Laurey Morale, MD    Sent: 12/08/2014   9:29 PM      To: Chyrl Civatte, RN, Noralee Space, RN, #  Yes, I can have nurses in CHF clinic set up RHC for me in the next couple of weeks.  I am out on vacation this week.   Megan or Khaden Gater: can one of you arrange this? Thanks.  D[place] ----- Message -----    From: Antoine Poche, MD    Sent: 12/08/2014  11:42 AM      To: Laurey Morale, MD  Rise Mu,  I think this is a patient we discussed a little while back. She has significant pulm HTN by echo with clinical signs of RV failure with significant edema and ascites. Her previous RHC showed normal pulm pressures however, though appeared to be some technical issues. Would you be willing to repeat her RHC, she may need consideration for pulmonary vasodilators  Christiane Ha

## 2015-01-14 NOTE — Telephone Encounter (Signed)
Pt sch for RHC 01/26/15, pt's daughter aware, instructions reviewed with her via phone and copy mailed

## 2015-01-19 ENCOUNTER — Other Ambulatory Visit: Payer: Self-pay | Admitting: Nurse Practitioner

## 2015-01-19 ENCOUNTER — Other Ambulatory Visit (HOSPITAL_COMMUNITY): Payer: Self-pay | Admitting: Oncology

## 2015-01-19 ENCOUNTER — Encounter (HOSPITAL_BASED_OUTPATIENT_CLINIC_OR_DEPARTMENT_OTHER): Payer: Medicare Other | Admitting: Hematology & Oncology

## 2015-01-19 ENCOUNTER — Encounter (HOSPITAL_COMMUNITY): Payer: Self-pay | Admitting: Hematology & Oncology

## 2015-01-19 ENCOUNTER — Telehealth: Payer: Self-pay

## 2015-01-19 VITALS — BP 93/68 | HR 90 | Temp 98.0°F | Resp 20 | Wt 191.3 lb

## 2015-01-19 DIAGNOSIS — D509 Iron deficiency anemia, unspecified: Secondary | ICD-10-CM | POA: Diagnosis not present

## 2015-01-19 DIAGNOSIS — R634 Abnormal weight loss: Secondary | ICD-10-CM | POA: Diagnosis not present

## 2015-01-19 DIAGNOSIS — E1165 Type 2 diabetes mellitus with hyperglycemia: Secondary | ICD-10-CM | POA: Diagnosis not present

## 2015-01-19 DIAGNOSIS — R160 Hepatomegaly, not elsewhere classified: Secondary | ICD-10-CM

## 2015-01-19 LAB — FERRITIN: FERRITIN: 87 ng/mL (ref 10–291)

## 2015-01-19 NOTE — Telephone Encounter (Signed)
[facility] called with Labs results. Informed LSL.

## 2015-01-19 NOTE — Progress Notes (Signed)
[facility] Hematology/Oncology Consultation   Name: [name]      MRN: [id number]    Location: Room/bed info not found  Date: 01/19/2015 Time:4:32 PM   REFERRING PHYSICIAN:  Jonette Eva, MD  REASON FOR CONSULT:  Iron deficiency anemia   DIAGNOSIS:  Microcytic, hypochromic anemia in the setting of a normal platelet count and WBC count with a recent ferritin of 7 on 11/21/2014.  HISTORY OF PRESENT ILLNESS:   [name] is a 64 yo white American woman with a past medical history significant for IBS, hypothyroidism, hyperlipidemia, gastroparesis, HTN, DM type 2, CHF, and A-Fib was referred to [facility] for iron deficiency anemia after an extensive GI work-up that is negative including colonoscopy (11/08/2013), EGD (11/21/2014), and camera capsule study.   I personally reviewed and went over laboratory results with the patient.  The results are noted within this dictation.  Mild anemia dating back to at least 2009 but frank microcytosis and hypochromosis began in March 2016.  He was ordered for bloodwork here today but states she just had it across the street. She does not wish to have additional studies today. Unfortunately I am not certain what labs were performed. She did have 1 dose of ferraheme and notes that she has had some minor improvement in energy. But overall her energy level remains poor. She notes she has been scheduled for a cardiac catheterization in the next week. She has no other complaints.  PAST MEDICAL HISTORY:   Past Medical History  Diagnosis Date  . Chest pain     Negative cardiac catheterization in 2002; negative stress nuclear study in 2008  . Congestive heart failure     Normal LV systolic function  . PAF (paroxysmal atrial fibrillation) 2009  . Chronic anticoagulation   . Diabetes mellitus, type 2     Insulin therapy; exacerbated by prednisone  . Syncope     Admitted 05/2009; magnetic resonance imagin/ MRA - negative; etiology thought to be  orthostasis secondary to drugs and dehydration  . GERD (gastroesophageal reflux disease)   . IBS (irritable bowel syndrome)   . Hiatal hernia   . Gastroparesis     99% retention 05/2008 on GES  . Hyperlipidemia   . Hypertension   . Hypothyroid   . Chronic LBP     Surgical intervention in 1996  . Obesity   . OSA on CPAP   . Anemia     H/H of 10/30 with a normal MCV in 12/09  . Barrett's esophagus     Diagnosed 1995. Last EGD 2016-NO BARRETT'S.   . Seizures     ALLERGIES: Allergies  Allergen Reactions  . Ibuprofen     Upsets her stomach and causes abdominal pain  . Celexa [Citalopram Hydrobromide] Other (See Comments)    Dyskinesia  . Codeine Nausea And Vomiting    HALLUCINATIONS  . Reglan [Metoclopramide] Other (See Comments)    DYSKINESIA      MEDICATIONS: I have reviewed the patient's current medications.    Current Outpatient Prescriptions on File Prior to Visit  Medication Sig Dispense Refill  . carvedilol (COREG) 6.25 MG tablet Take 1 tablet (6.25 mg total) by mouth 2 (two) times daily. 180 tablet 3  . dicyclomine (BENTYL) 10 MG capsule 1 po 30 mins before meals up to three times a day or q4h prn for abdominal pain/diarrhea. NO MORE 8 DOSES IN A DAY. 45 capsule 11  . dronabinol (MARINOL) 2.5 MG  capsule Take 1 capsule (2.5 mg total) by mouth 2 (two) times daily before a meal. 60 capsule 0  . DULoxetine (CYMBALTA) 30 MG capsule Take 30 mg by mouth 2 (two) times daily.    Marland Kitchen ELIQUIS 5 MG TABS tablet TAKE 1 TABLET BY MOUTH TWICE DAILY. 60 tablet 3  . furosemide (LASIX) 20 MG tablet Take 1 tablet (20 mg total) by mouth 2 (two) times daily. 180 tablet 3  . glipiZIDE (GLUCOTROL) 10 MG tablet Take 10 mg by mouth daily before breakfast.     . Insulin Glargine (LANTUS SOLOSTAR) 100 UNIT/ML Solostar Pen Inject 20-50 Units into the skin at bedtime as needed. Patient takes 20 units every night at bedtime if over 178.  Takes 50 units if her blood sugar is above 300.    .  levETIRAcetam (KEPPRA) 250 MG tablet Take 1 tablet (250 mg total) by mouth 2 (two) times daily. 60 tablet 11  . levothyroxine (SYNTHROID, LEVOTHROID) 137 MCG tablet Take 137 mcg by mouth daily.    Marland Kitchen lisinopril (PRINIVIL,ZESTRIL) 2.5 MG tablet Take 1 tablet (2.5 mg total) by mouth daily. 90 tablet 3  . loperamide (IMODIUM) 2 MG capsule TAKE (1) CAPSULE THREE TIMES DAILY AS NEEDED FOR DIARRHEA OR LOOSE STOOLS. 30 capsule 5  . LORazepam (ATIVAN) 1 MG tablet Take 1-2 mg by mouth daily as needed for anxiety or sleep. For anxiety    . magnesium oxide (MAG-OX) 400 MG tablet Take 1 tablet (400 mg total) by mouth daily. 30 tablet 6  . metaxalone (SKELAXIN) 800 MG tablet Take 800 mg by mouth 2 (two) times daily.     . metFORMIN (GLUCOPHAGE) 500 MG tablet Take 1,000 mg by mouth 2 (two) times daily with a meal.     . Multiple Vitamin (MULTIVITAMIN WITH MINERALS) TABS tablet Take 1 tablet by mouth daily.    . nitroGLYCERIN (NITROSTAT) 0.4 MG SL tablet Place 0.4 mg under the tongue every 5 (five) minutes as needed for chest pain. For chest pain     . omeprazole (PRILOSEC) 20 MG capsule Take 20 mg by mouth 2 (two) times daily.      . ondansetron (ZOFRAN) 4 MG tablet Take 4 mg by mouth every 6 (six) hours as needed. For nausea    . potassium chloride (K-DUR) 10 MEQ tablet 10 mEq 2 (two) times daily.     . vitamin B-12 (CYANOCOBALAMIN) 500 MCG tablet Take 500 mcg by mouth daily.    . Iron-FA-B Cmp-C-Biot-Probiotic (FUSION PLUS PO) Take by mouth.     No current facility-administered medications on file prior to visit.     PAST SURGICAL HISTORY Past Surgical History  Procedure Laterality Date  . Cardiovascular stress test  2008    Stress nuclear study  . Back surgery  1996  . Laparoscopic cholecystectomy  1990s  . Total abdominal hysterectomy  1999  . Laminectomy  1995    L4-L5  . Carpal tunnel release  1994  . Esophagogastroduodenoscopy  2008    Barrett's without dysplasia. esphagus dilated. antral  erosions, h.pylori serologies negative.  . Colonoscopy  11/2011    Dr. Darrick Penna: Internal hemorrhoids, mild diverticulosis. Random colon biopsies negative.  . Givens capsule study  12/07/2011    Proximal small bowel, rare AVM. Distal small bowel, multiple ulcers noted  . Esophagogastroduodenoscopy  11/2011    Dr. Darrick Penna: Barrett's esophagus, mild gastritis, diverticulum in the second portion of the duodenum repeat EGD 3 years. Small bowel biopsies negative. Gastric biopsy show  reactive gastropathy but no H. pylori. Esophageal biopsies consistent with GERD. Next EGD 11/2014  . Givens capsule study N/A 09/27/2013    Distal small bowel ulcers extending to TI.  Marland Kitchen Givens capsule study N/A 10/10/2013    Procedure: GIVENS CAPSULE STUDY;  Surgeon: West Bali, MD;  [facility];  Service: Endoscopy;  Laterality: N/A;  7:30  . Colonoscopy N/A 11/08/2013    SLF: Normal mucosa in the terminal ileum/The colon IS redundant/  Moderate diverticulosis throughout the entire colon. ileum bx benign. colon bx benign  . Esophageal biopsy N/A 11/08/2013    Procedure: BIOPSY  / Tissue sampling / ulcers present in small intestine;  Surgeon: West Bali, MD;  [facility]: [facility];  Service: Endoscopy;  Laterality: N/A;  . Cardiac catheterization  2002  . Right heart catheterization N/A 01/10/2014    Procedure: RIGHT HEART CATH;  Surgeon: Lesleigh Noe, MD;  Location: [facility] CATH LAB;  Service: Cardiovascular;  Laterality: N/A;  . Left heart catheterization with coronary angiogram  01/10/2014    Procedure: LEFT HEART CATHETERIZATION WITH CORONARY ANGIOGRAM;  Surgeon: Lesleigh Noe, MD;  Location: [facility] CATH LAB;  Service: Cardiovascular;;  . Esophagogastroduodenoscopy N/A 11/21/2014    Procedure: ESOPHAGOGASTRODUODENOSCOPY (EGD);  Surgeon: West Bali, MD;  Location: [facility];  Service: Endoscopy;  Laterality: N/A;  12:00 - moved to 12:45 - office to notify    FAMILY HISTORY: Family History  Problem  Relation Age of Onset  . Hypertension Mother   . Alzheimer's disease Mother   . Stroke Mother   . Heart attack Mother   . Heart disease Neg Hx   . Hypertension Other   . Colon cancer Neg Hx     SOCIAL HISTORY:  reports that she quit smoking about 31 years ago. She started smoking about 48 years ago. She has never used smokeless tobacco. She reports that she does not drink alcohol or use illicit drugs.  PERFORMANCE STATUS: The patient's performance status is 1 - Symptomatic but completely ambulatory  PHYSICAL EXAM: Most Recent Vital Signs: Blood pressure 93/68, pulse 90, temperature 98 F (36.7 C), temperature source Oral, resp. rate 20, weight 191 lb 4.8 oz (86.773 kg), SpO2 93 %. General appearance: alert, cooperative, appears stated age, fatigued, no distress and moderately obese Head: Normocephalic, without obvious abnormality, atraumatic Eyes: negative findings: lids and lashes normal, conjunctivae and sclerae normal, corneas clear and pupils equal, round, reactive to light and accomodation Throat: lips, mucosa, and tongue normal; teeth and gums normal Neck: no adenopathy, no carotid bruit, no JVD and supple, symmetrical, trachea midline Lungs: clear to auscultation bilaterally Heart: irregularly irregular rhythm Abdomen: abnormal findings:  distended and hepatomegaly, liver edge palpable 5 cm below costal margin Extremities: extremities normal, atraumatic, no cyanosis or edema Skin: Skin color, texture, turgor normal. No rashes or lesions Lymph nodes: Cervical, supraclavicular, and axillary nodes normal. Neurologic: Grossly normal  LABORATORY DATA:  CBC    Component Value Date/Time   WBC 5.7 12/26/2014 1705   RBC 3.96 12/26/2014 1705   RBC 3.77* 06/30/2013 0318   HGB 9.2* 12/26/2014 1705   HCT 31.9* 12/26/2014 1705   PLT 219 12/26/2014 1705   MCV 80.6 12/26/2014 1705   MCH 23.2* 12/26/2014 1705   MCHC 28.8* 12/26/2014 1705   RDW 19.4* 12/26/2014 1705   LYMPHSABS 2.1  12/26/2014 1705   MONOABS 0.7 12/26/2014 1705   EOSABS 0.1 12/26/2014 1705   BASOSABS 0.0 12/26/2014 1705  Chemistry      Component Value Date/Time   NA 139 11/29/2014 0803   K 5.0 11/29/2014 0803   CL 108 11/29/2014 0803   CO2 25 11/29/2014 0803   BUN 18 11/29/2014 0803   CREATININE 0.75 11/29/2014 0803   CREATININE 0.70 08/25/2014 0830      Component Value Date/Time   CALCIUM 9.0 11/29/2014 0803   CALCIUM 8.4 06/30/2013 0318   ALKPHOS 48 10/17/2014 1219   AST 10 10/17/2014 1219   ALT <8 10/17/2014 1219   BILITOT 0.6 10/17/2014 1219     Lab Results  Component Value Date   IRON 29 12/26/2014   TIBC 458* 12/26/2014   FERRITIN 87 01/19/2015   Lab Results  Component Value Date   FOLATE 46.2 12/26/2014   Lab Results  Component Value Date   VITAMINB12 3696* 12/26/2014    RADIOGRAPHY:  CLINICAL DATA: 64 year old diabetic hypertensive female with generalized abdominal pain and swelling for the past month. Nausea, diarrhea and constipation. On prior CT, history of hysterectomy, cholecystectomy and appendectomy. History of small-bowel ulcers. Initial encounter.  EXAM: CT ABDOMEN AND PELVIS WITH CONTRAST  TECHNIQUE: Multidetector CT imaging of the abdomen and pelvis was performed using the standard protocol following bolus administration of intravenous contrast.  CONTRAST: OMNIPAQUE IOHEXOL 300 MG/ML SOLN  COMPARISON: 09/03/2013 CT.  FINDINGS: Respiratory motion degraded examination.  Ascites. Third spacing of fluid. Etiology indeterminate but may be related to heart dysfunction. Prominent hepatic veins.  Cardiomegaly. Mitral valve calcifications.  Small plural effusions with basilar atelectasis.  Atherosclerotic type changes aorta and branch vessels most notable involving the proximal renal artery bilaterally.  Evaluation for extra luminal bowel inflammatory process is limited given the ascites. No extra luminal free air.  Under distended stomach without obvious primary bowel abnormality.  Liver is enlarged spanning over 21 cm. No focal worrisome hepatic lesion. Post cholecystectomy.  No worrisome splenic, pancreatic, renal or adrenal abnormality. Post cholecystectomy.  No adenopathy. Slightly rounded left external iliac node unchanged.  Congenital fusion L2-3. Degenerative changes lower thoracic spine and lumbar spine and most notable L4-5 and L5-S1 level. No osseous destructive lesion.  Post hysterectomy. No adnexal mass identified.  Fatty containing anterior abdominal wall hernia.  IMPRESSION: Respiratory motion degraded examination.  Ascites. Third spacing of fluid. Etiology indeterminate but may be related to heart dysfunction. Prominent hepatic veins.  Cardiomegaly. Mitral valve calcifications.  Small plural effusions with basilar atelectasis.  Atherosclerotic type changes aorta and branch vessels most notable involving the proximal renal artery bilaterally.  Evaluation for extra luminal bowel inflammatory process is limited given the ascites. No extra luminal free air.  Liver is enlarged spanning over 21 cm.  These results will be called to the ordering clinician or representative by the Radiologist Assistant, and communication documented in the PACS or zVision Dashboard.   Electronically Signed  By: Lacy Duverney M.D.  On: 08/25/2014 09:35     ASSESSMENT/PLAN:  1. Iron deficiency anemia, with ferrous sulfate PO failure.  Taking ferrous sulfate x 6 months.  No improvement in iron studies or Hgb.  S/P extensive GI work-up without any obvious source of iron deficiency anemia. 2. Unexplained hepatomegaly  She has received IV iron with good tolerance. She had laboratory studies done at another lab this morning and is not interested in being "re-stuck" again. We will try to obtain copies of those labs when they are available. Hopefully there is a  CBC.  Pending the results of her CBC will make any additional recommendations  regarding labs prior to follow-up. I advised the patient she may need additional iron replacement. We will tentatively schedule her for an 8 week follow-up. At that point she should have finished her cardiac evaluation as well.  All questions were answered. The patient knows to call the clinic with any problems, questions or concerns. We can certainly see the patient much sooner if necessary.  This note is electronically signed by: Arvil Chaco, MD 01/19/2015 4:32 PM

## 2015-01-19 NOTE — Patient Instructions (Signed)
[facility] at [facility] Discharge Instructions  RECOMMENDATIONS MADE BY THE CONSULTANT AND ANY TEST RESULTS WILL BE SENT TO YOUR REFERRING PHYSICIAN.  Exam and discussion by Dr. Galen Manila We will try to get copies of your labs from today.  If we can't or if they did not do what we need we may need to have you come back for labs. Follow-up in 8 week with labs here and office visit.  Thank you for choosing [facility] at [facility] to provide your oncology and hematology care.  To afford each patient quality time with our provider, please arrive at least 15 minutes before your scheduled appointment time.    You need to re-schedule your appointment should you arrive 10 or more minutes late.  We strive to give you quality time with our providers, and arriving late affects you and other patients whose appointments are after yours.  Also, if you no show three or more times for appointments you may be dismissed from the clinic at the providers discretion.     Again, thank you for choosing [facility].  Our hope is that these requests will decrease the amount of time that you wait before being seen by our physicians.       _____________________________________________________________  Should you have questions after your visit to [facility], please contact our office at [phone] between the hours of 8:30 a.m. and 4:30 p.m.  Voicemails left after 4:30 p.m. will not be returned until the following business day.  For prescription refill requests, have your pharmacy contact our office.

## 2015-01-20 LAB — CBC WITH DIFFERENTIAL/PLATELET
BASOS ABS: 0 10*3/uL (ref 0.0–0.1)
Basophils Relative: 0 % (ref 0–1)
EOS PCT: 1 % (ref 0–5)
Eosinophils Absolute: 0.1 10*3/uL (ref 0.0–0.7)
HEMATOCRIT: 33.7 % — AB (ref 36.0–46.0)
Hemoglobin: 9.9 g/dL — ABNORMAL LOW (ref 12.0–15.0)
LYMPHS PCT: 28 % (ref 12–46)
Lymphs Abs: 1.4 10*3/uL (ref 0.7–4.0)
MCH: 24.4 pg — AB (ref 26.0–34.0)
MCHC: 29.4 g/dL — ABNORMAL LOW (ref 30.0–36.0)
MCV: 83 fL (ref 78.0–100.0)
MPV: 11.1 fL (ref 8.6–12.4)
Monocytes Absolute: 0.5 10*3/uL (ref 0.1–1.0)
Monocytes Relative: 9 % (ref 3–12)
NEUTROS PCT: 62 % (ref 43–77)
Neutro Abs: 3.2 10*3/uL (ref 1.7–7.7)
PLATELETS: 190 10*3/uL (ref 150–400)
RBC: 4.06 MIL/uL (ref 3.87–5.11)
RDW: 21.4 % — AB (ref 11.5–15.5)
WBC: 5.1 10*3/uL (ref 4.0–10.5)

## 2015-01-21 ENCOUNTER — Encounter (HOSPITAL_COMMUNITY): Payer: Self-pay | Admitting: Pharmacy Technician

## 2015-01-21 ENCOUNTER — Telehealth: Payer: Self-pay | Admitting: Gastroenterology

## 2015-01-21 NOTE — Telephone Encounter (Signed)
[name], please review results and advise!

## 2015-01-21 NOTE — Telephone Encounter (Signed)
Fannie Knee (daughter of patient) called to say that patient had labs done on Monday and Dr Galen Manila wanted those results faxed to her at [phone].  I checked in epic and I see the orders for labs, but no results.  Since the results are in epic as well as Dr Chiquita Loth office do I still need to fax these?

## 2015-01-22 NOTE — Progress Notes (Signed)
Quick Note:  Hgb and ferritin improved.  Patient should keep follow up OV here in 03/2015.  Forwarding labs to Dr. Galen Manila who also requested same labs. ______

## 2015-01-22 NOTE — Telephone Encounter (Signed)
The results are there, 01/19/15, ferritin and CBC.  Patient saw Dr. Galen Manila 01/19/15.  Please let patient know her Hgb is slightly better and her iron stores are improved.   I have forwarded the labs to Dr. Galen Manila as well.

## 2015-01-22 NOTE — Telephone Encounter (Signed)
[name] is aware of results. See result note.

## 2015-01-22 NOTE — Progress Notes (Signed)
Quick Note:  I called and informed pt's daughter, [name]. She said she changed the appt from Sept to 02/16/2015 at 10:00 AM with Tana Coast, PA since her mom is still having some abdominal pain.   Sending to Darl Pikes to fax the results to Dr. Galen Manila. ______

## 2015-01-23 ENCOUNTER — Encounter (HOSPITAL_COMMUNITY): Payer: Self-pay | Admitting: Cardiology

## 2015-01-23 NOTE — Progress Notes (Signed)
Pt scheduled for [facility] on 01/26/2015 With Dr. Shirlee Latch Cpt code 59163 With pts current insurance- medicare npcr

## 2015-01-26 ENCOUNTER — Inpatient Hospital Stay (HOSPITAL_COMMUNITY)
Admission: RE | Admit: 2015-01-26 | Discharge: 2015-01-29 | DRG: 287 | Disposition: A | Discharge status: Home / Self Care | Payer: Medicare Other | Source: Ambulatory Visit | Attending: Cardiology | Admitting: Cardiology

## 2015-01-26 ENCOUNTER — Encounter (HOSPITAL_COMMUNITY)
Admission: RE | Disposition: A | Discharge status: Home / Self Care | Payer: Medicare Other | Source: Ambulatory Visit | Attending: Cardiology

## 2015-01-26 ENCOUNTER — Other Ambulatory Visit: Payer: Self-pay

## 2015-01-26 DIAGNOSIS — Z8249 Family history of ischemic heart disease and other diseases of the circulatory system: Secondary | ICD-10-CM

## 2015-01-26 DIAGNOSIS — Z87891 Personal history of nicotine dependence: Secondary | ICD-10-CM | POA: Diagnosis not present

## 2015-01-26 DIAGNOSIS — I482 Chronic atrial fibrillation: Secondary | ICD-10-CM | POA: Diagnosis not present

## 2015-01-26 DIAGNOSIS — I481 Persistent atrial fibrillation: Secondary | ICD-10-CM | POA: Diagnosis not present

## 2015-01-26 DIAGNOSIS — I272 Other secondary pulmonary hypertension: Secondary | ICD-10-CM | POA: Diagnosis present

## 2015-01-26 DIAGNOSIS — Z823 Family history of stroke: Secondary | ICD-10-CM | POA: Diagnosis not present

## 2015-01-26 DIAGNOSIS — I10 Essential (primary) hypertension: Secondary | ICD-10-CM | POA: Diagnosis present

## 2015-01-26 DIAGNOSIS — I5033 Acute on chronic diastolic (congestive) heart failure: Principal | ICD-10-CM | POA: Diagnosis present

## 2015-01-26 DIAGNOSIS — Z79899 Other long term (current) drug therapy: Secondary | ICD-10-CM

## 2015-01-26 DIAGNOSIS — Z6828 Body mass index (BMI) 28.0-28.9, adult: Secondary | ICD-10-CM

## 2015-01-26 DIAGNOSIS — Z794 Long term (current) use of insulin: Secondary | ICD-10-CM

## 2015-01-26 DIAGNOSIS — E669 Obesity, unspecified: Secondary | ICD-10-CM | POA: Diagnosis present

## 2015-01-26 DIAGNOSIS — Z7901 Long term (current) use of anticoagulants: Secondary | ICD-10-CM

## 2015-01-26 DIAGNOSIS — R0902 Hypoxemia: Secondary | ICD-10-CM | POA: Diagnosis present

## 2015-01-26 DIAGNOSIS — I428 Other cardiomyopathies: Secondary | ICD-10-CM | POA: Diagnosis present

## 2015-01-26 DIAGNOSIS — I509 Heart failure, unspecified: Secondary | ICD-10-CM | POA: Diagnosis not present

## 2015-01-26 DIAGNOSIS — I270 Primary pulmonary hypertension: Secondary | ICD-10-CM | POA: Diagnosis not present

## 2015-01-26 DIAGNOSIS — I5043 Acute on chronic combined systolic (congestive) and diastolic (congestive) heart failure: Secondary | ICD-10-CM | POA: Diagnosis not present

## 2015-01-26 HISTORY — DX: Pulmonary hypertension, unspecified: I27.20

## 2015-01-26 HISTORY — PX: CARDIAC CATHETERIZATION: SHX172

## 2015-01-26 LAB — CBC
HEMATOCRIT: 36.9 % (ref 36.0–46.0)
Hemoglobin: 11.1 g/dL — ABNORMAL LOW (ref 12.0–15.0)
MCH: 25.4 pg — AB (ref 26.0–34.0)
MCHC: 30.1 g/dL (ref 30.0–36.0)
MCV: 84.4 fL (ref 78.0–100.0)
PLATELETS: 166 10*3/uL (ref 150–400)
RBC: 4.37 MIL/uL (ref 3.87–5.11)
RDW: 22.6 % — AB (ref 11.5–15.5)
WBC: 6.3 10*3/uL (ref 4.0–10.5)

## 2015-01-26 LAB — BASIC METABOLIC PANEL
Anion gap: 5 (ref 5–15)
BUN: 19 mg/dL (ref 6–20)
CHLORIDE: 106 mmol/L (ref 101–111)
CO2: 28 mmol/L (ref 22–32)
Calcium: 9.4 mg/dL (ref 8.9–10.3)
Creatinine, Ser: 0.6 mg/dL (ref 0.44–1.00)
Glucose, Bld: 170 mg/dL — ABNORMAL HIGH (ref 65–99)
POTASSIUM: 4.6 mmol/L (ref 3.5–5.1)
Sodium: 139 mmol/L (ref 135–145)

## 2015-01-26 LAB — POCT I-STAT 3, VENOUS BLOOD GAS (G3P V)
ACID-BASE DEFICIT: 1 mmol/L (ref 0.0–2.0)
Acid-base deficit: 2 mmol/L (ref 0.0–2.0)
Bicarbonate: 24.7 mEq/L — ABNORMAL HIGH (ref 20.0–24.0)
Bicarbonate: 25.6 mEq/L — ABNORMAL HIGH (ref 20.0–24.0)
O2 SAT: 60 %
O2 Saturation: 59 %
PCO2 VEN: 49 mmHg (ref 45.0–50.0)
PCO2 VEN: 50.4 mmHg — AB (ref 45.0–50.0)
PH VEN: 7.311 — AB (ref 7.250–7.300)
PH VEN: 7.314 — AB (ref 7.250–7.300)
PO2 VEN: 34 mmHg (ref 30.0–45.0)
PO2 VEN: 34 mmHg (ref 30.0–45.0)
TCO2: 26 mmol/L (ref 0–100)
TCO2: 27 mmol/L (ref 0–100)

## 2015-01-26 LAB — CBC WITH DIFFERENTIAL/PLATELET
BASOS PCT: 1 % (ref 0–1)
Basophils Absolute: 0.1 10*3/uL (ref 0.0–0.1)
EOS ABS: 0.1 10*3/uL (ref 0.0–0.7)
EOS PCT: 1 % (ref 0–5)
HEMATOCRIT: 36.2 % (ref 36.0–46.0)
HEMOGLOBIN: 10.8 g/dL — AB (ref 12.0–15.0)
LYMPHS ABS: 2 10*3/uL (ref 0.7–4.0)
Lymphocytes Relative: 29 % (ref 12–46)
MCH: 25.4 pg — AB (ref 26.0–34.0)
MCHC: 29.8 g/dL — ABNORMAL LOW (ref 30.0–36.0)
MCV: 85 fL (ref 78.0–100.0)
Monocytes Absolute: 0.7 10*3/uL (ref 0.1–1.0)
Monocytes Relative: 10 % (ref 3–12)
NEUTROS ABS: 4 10*3/uL (ref 1.7–7.7)
NEUTROS PCT: 59 % (ref 43–77)
Platelets: 163 10*3/uL (ref 150–400)
RBC: 4.26 MIL/uL (ref 3.87–5.11)
RDW: 22.4 % — AB (ref 11.5–15.5)
WBC: 6.9 10*3/uL (ref 4.0–10.5)

## 2015-01-26 LAB — GLUCOSE, CAPILLARY
GLUCOSE-CAPILLARY: 165 mg/dL — AB (ref 65–99)
GLUCOSE-CAPILLARY: 170 mg/dL — AB (ref 65–99)
GLUCOSE-CAPILLARY: 215 mg/dL — AB (ref 65–99)
Glucose-Capillary: 209 mg/dL — ABNORMAL HIGH (ref 65–99)

## 2015-01-26 LAB — COMPREHENSIVE METABOLIC PANEL
ALT: 8 U/L — ABNORMAL LOW (ref 14–54)
ANION GAP: 7 (ref 5–15)
AST: 16 U/L (ref 15–41)
Albumin: 3.8 g/dL (ref 3.5–5.0)
Alkaline Phosphatase: 59 U/L (ref 38–126)
BUN: 17 mg/dL (ref 6–20)
CALCIUM: 9.4 mg/dL (ref 8.9–10.3)
CO2: 25 mmol/L (ref 22–32)
CREATININE: 0.62 mg/dL (ref 0.44–1.00)
Chloride: 108 mmol/L (ref 101–111)
GFR calc Af Amer: >60 mL/min (ref >60)
GLUCOSE: 187 mg/dL — AB (ref 65–99)
Potassium: 4.6 mmol/L (ref 3.5–5.1)
Sodium: 140 mmol/L (ref 135–145)
Total Bilirubin: 0.6 mg/dL (ref 0.3–1.2)
Total Protein: 6.9 g/dL (ref 6.5–8.1)

## 2015-01-26 LAB — PROTIME-INR
INR: 1.28 (ref 0.00–1.49)
Prothrombin Time: 16.1 seconds — ABNORMAL HIGH (ref 11.6–15.2)

## 2015-01-26 LAB — BRAIN NATRIURETIC PEPTIDE: B Natriuretic Peptide: 566 pg/mL — ABNORMAL HIGH (ref 0.0–100.0)

## 2015-01-26 LAB — MRSA PCR SCREENING: MRSA BY PCR: NEGATIVE

## 2015-01-26 SURGERY — RIGHT HEART CATH
Anesthesia: LOCAL

## 2015-01-26 MED ORDER — GLIPIZIDE 10 MG PO TABS
10.0000 mg | ORAL_TABLET | Freq: Every day | ORAL | Status: DC
  Administered 2015-01-27 – 2015-01-29 (×3): 10 mg via ORAL
  Filled 2015-01-26 (×2): qty 1
  Filled 2015-01-26: qty 2

## 2015-01-26 MED ORDER — ONDANSETRON HCL 4 MG PO TABS: 4.0000 mg | ORAL_TABLET | Freq: Four times a day (QID) | ORAL | Status: DC | PRN

## 2015-01-26 MED ORDER — DICYCLOMINE HCL 10 MG PO CAPS
10.0000 mg | ORAL_CAPSULE | Freq: Every day | ORAL | Status: DC | PRN
Start: 2015-01-26 — End: 2015-01-29
  Administered 2015-01-26: 10 mg via ORAL
  Filled 2015-01-26: qty 1

## 2015-01-26 MED ORDER — DRONABINOL 2.5 MG PO CAPS
2.5000 mg | ORAL_CAPSULE | Freq: Two times a day (BID) | ORAL | Status: DC
  Administered 2015-01-27 – 2015-01-29 (×5): 2.5 mg via ORAL
  Filled 2015-01-26 (×5): qty 1

## 2015-01-26 MED ORDER — HEPARIN (PORCINE) IN NACL 2-0.9 UNIT/ML-% IJ SOLN
INTRAMUSCULAR | Status: AC
  Filled 2015-01-26: qty 500

## 2015-01-26 MED ORDER — FUROSEMIDE 10 MG/ML IJ SOLN
INTRAMUSCULAR | Status: AC
  Filled 2015-01-26: qty 8

## 2015-01-26 MED ORDER — METAXALONE 800 MG PO TABS
800.0000 mg | ORAL_TABLET | Freq: Two times a day (BID) | ORAL | Status: DC
  Administered 2015-01-26 – 2015-01-29 (×6): 800 mg via ORAL
  Filled 2015-01-26 (×8): qty 1

## 2015-01-26 MED ORDER — LIDOCAINE HCL (PF) 1 % IJ SOLN
INTRAMUSCULAR | Status: AC
  Filled 2015-01-26: qty 30

## 2015-01-26 MED ORDER — MIDAZOLAM HCL 2 MG/2ML IJ SOLN
INTRAMUSCULAR | Status: AC
  Filled 2015-01-26: qty 2

## 2015-01-26 MED ORDER — LORAZEPAM 1 MG PO TABS
1.0000 mg | ORAL_TABLET | Freq: Every day | ORAL | Status: DC | PRN
  Administered 2015-01-26 – 2015-01-28 (×3): 1 mg via ORAL
  Filled 2015-01-26 (×3): qty 1

## 2015-01-26 MED ORDER — FENTANYL CITRATE (PF) 100 MCG/2ML IJ SOLN
INTRAMUSCULAR | Status: DC | PRN
  Administered 2015-01-26: 25 ug via INTRAVENOUS

## 2015-01-26 MED ORDER — DULOXETINE HCL 30 MG PO CPEP
30.0000 mg | ORAL_CAPSULE | Freq: Two times a day (BID) | ORAL | Status: DC
  Administered 2015-01-26 – 2015-01-29 (×7): 30 mg via ORAL
  Filled 2015-01-26 (×8): qty 1

## 2015-01-26 MED ORDER — LEVOTHYROXINE SODIUM 137 MCG PO TABS
137.0000 ug | ORAL_TABLET | Freq: Every day | ORAL | Status: DC
  Administered 2015-01-27 – 2015-01-29 (×3): 137 ug via ORAL
  Filled 2015-01-26 (×5): qty 1

## 2015-01-26 MED ORDER — SODIUM CHLORIDE 0.9 % IV SOLN: 250.0000 mL | INTRAVENOUS | Status: DC | PRN

## 2015-01-26 MED ORDER — LEVETIRACETAM 250 MG PO TABS
250.0000 mg | ORAL_TABLET | Freq: Two times a day (BID) | ORAL | Status: DC
  Administered 2015-01-26 – 2015-01-29 (×6): 250 mg via ORAL
  Filled 2015-01-26 (×8): qty 1

## 2015-01-26 MED ORDER — SODIUM CHLORIDE 0.9 % IJ SOLN
3.0000 mL | Freq: Two times a day (BID) | INTRAMUSCULAR | Status: DC
  Administered 2015-01-26 – 2015-01-29 (×7): 3 mL via INTRAVENOUS

## 2015-01-26 MED ORDER — SODIUM CHLORIDE 0.9 % IJ SOLN
3.0000 mL | Freq: Two times a day (BID) | INTRAMUSCULAR | Status: DC
Start: 2015-01-26 — End: 2015-01-26

## 2015-01-26 MED ORDER — SODIUM CHLORIDE 0.9 % IJ SOLN: 3.0000 mL | INTRAMUSCULAR | Status: DC | PRN

## 2015-01-26 MED ORDER — LISINOPRIL 2.5 MG PO TABS: 2.5000 mg | ORAL_TABLET | Freq: Every day | ORAL | Status: DC

## 2015-01-26 MED ORDER — INSULIN ASPART 100 UNIT/ML ~~LOC~~ SOLN
0.0000 [IU] | Freq: Three times a day (TID) | SUBCUTANEOUS | Status: DC
  Administered 2015-01-27: 3 [IU] via SUBCUTANEOUS
  Administered 2015-01-27: 5 [IU] via SUBCUTANEOUS
  Administered 2015-01-28: 3 [IU] via SUBCUTANEOUS
  Administered 2015-01-28: 2 [IU] via SUBCUTANEOUS
  Administered 2015-01-28 – 2015-01-29 (×2): 3 [IU] via SUBCUTANEOUS

## 2015-01-26 MED ORDER — APIXABAN 5 MG PO TABS
5.0000 mg | ORAL_TABLET | Freq: Two times a day (BID) | ORAL | Status: DC
  Administered 2015-01-26 – 2015-01-29 (×6): 5 mg via ORAL
  Filled 2015-01-26 (×6): qty 1

## 2015-01-26 MED ORDER — ONDANSETRON HCL 4 MG/2ML IJ SOLN: 4.0000 mg | Freq: Four times a day (QID) | INTRAMUSCULAR | Status: DC | PRN

## 2015-01-26 MED ORDER — PANTOPRAZOLE SODIUM 40 MG PO TBEC
40.0000 mg | DELAYED_RELEASE_TABLET | Freq: Every day | ORAL | Status: DC
  Administered 2015-01-26 – 2015-01-29 (×4): 40 mg via ORAL
  Filled 2015-01-26 (×4): qty 1

## 2015-01-26 MED ORDER — ACETAMINOPHEN 325 MG PO TABS: 650.0000 mg | ORAL_TABLET | ORAL | Status: DC | PRN

## 2015-01-26 MED ORDER — ADULT MULTIVITAMIN W/MINERALS CH
1.0000 | ORAL_TABLET | Freq: Every day | ORAL | Status: DC
  Administered 2015-01-27 – 2015-01-29 (×3): 1 via ORAL
  Filled 2015-01-26 (×3): qty 1

## 2015-01-26 MED ORDER — FENTANYL CITRATE (PF) 100 MCG/2ML IJ SOLN
INTRAMUSCULAR | Status: AC
  Filled 2015-01-26: qty 2

## 2015-01-26 MED ORDER — MIDAZOLAM HCL 2 MG/2ML IJ SOLN
INTRAMUSCULAR | Status: DC | PRN
  Administered 2015-01-26: 1 mg via INTRAVENOUS

## 2015-01-26 MED ORDER — MAGNESIUM OXIDE 400 (241.3 MG) MG PO TABS
400.0000 mg | ORAL_TABLET | Freq: Every day | ORAL | Status: DC
  Administered 2015-01-27 – 2015-01-29 (×3): 400 mg via ORAL
  Filled 2015-01-26 (×6): qty 1

## 2015-01-26 MED ORDER — INSULIN ASPART 100 UNIT/ML ~~LOC~~ SOLN
0.0000 [IU] | Freq: Every day | SUBCUTANEOUS | Status: DC
  Administered 2015-01-26 – 2015-01-28 (×2): 2 [IU] via SUBCUTANEOUS

## 2015-01-26 MED ORDER — SODIUM CHLORIDE 0.9 % IV SOLN
INTRAVENOUS | Status: DC
  Administered 2015-01-26: 09:00:00 via INTRAVENOUS

## 2015-01-26 MED ORDER — CYANOCOBALAMIN 500 MCG PO TABS
500.0000 ug | ORAL_TABLET | Freq: Every day | ORAL | Status: DC
  Administered 2015-01-26 – 2015-01-29 (×4): 500 ug via ORAL
  Filled 2015-01-26 (×4): qty 1

## 2015-01-26 MED ORDER — CARVEDILOL 6.25 MG PO TABS
6.2500 mg | ORAL_TABLET | Freq: Two times a day (BID) | ORAL | Status: DC
  Administered 2015-01-26 (×2): 6.25 mg via ORAL
  Filled 2015-01-26 (×3): qty 1

## 2015-01-26 MED ORDER — FUROSEMIDE 10 MG/ML IJ SOLN
80.0000 mg | Freq: Two times a day (BID) | INTRAMUSCULAR | Status: DC
  Administered 2015-01-26 – 2015-01-28 (×6): 80 mg via INTRAVENOUS
  Filled 2015-01-26 (×6): qty 8

## 2015-01-26 SURGICAL SUPPLY — 7 items
CATH BALLN WEDGE 5F 110CM (CATHETERS) ×2 IMPLANT
GUIDEWIRE .025 260CM (WIRE) ×1 IMPLANT
KIT HEART RIGHT NAMIC (KITS) ×2 IMPLANT
PACK CARDIAC CATHETERIZATION (CUSTOM PROCEDURE TRAY) ×2 IMPLANT
SHEATH FAST CATH BRACH 5F 5CM (SHEATH) ×2 IMPLANT
TRANSDUCER W/STOPCOCK (MISCELLANEOUS) ×3 IMPLANT
WIRE EMERALD 3MM-J .025X260CM (WIRE) ×1 IMPLANT

## 2015-01-26 NOTE — Progress Notes (Signed)
Eating [place] sandwich. Family in. Ambulating to BR to void. Requested foley. Dr. Shirlee Latch paged, order taken. Waiting on TCU

## 2015-01-26 NOTE — H&P (Addendum)
Physician History and Physical    [name] MRN: [id number] DOB/AGE: 1950-07-06 64 y.o. Admit date: 01/26/2015  Primary Cardiologist: Dr Wyline Mood  HPI: 64 yo with history of chronic atrial fibrillation, chronic systolic CHF (EF 46%, nonischemic cardiomyopathy), and pulmonary hypertension by echo presents for right heart cath.  She had right heart cath 1 year ago with only mild pulmonary hypertension.  However, there is a significant discrepancy with PASP estimated by echo, so she was re-referred for RHC.   Review of systems complete and found to be negative unless listed above   Family History  Problem Relation Age of Onset  . Hypertension Mother   . Alzheimer's disease Mother   . Stroke Mother   . Heart attack Mother   . Heart disease Neg Hx   . Hypertension Other   . Colon cancer Neg Hx     History   Social History  . Marital Status: Married    Spouse Name: N/A  . Number of Children: N/A  . Years of Education: N/A   Occupational History  . 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     Social History Main Topics  . Smoking status: Former Smoker -- 15 years    Start date: 02/26/1966    Quit date: 07/01/1983  . Smokeless tobacco: Never Used     Comment: quit in 1984  . Alcohol Use: No     Comment: last etoh one year ago, never frequent  . Drug Use: No  . Sexual Activity: Yes    Birth Control/ Protection: None, Surgical   Other Topics Concern  . Not on file   Social History Narrative   Sedentary   4 children, "blended family"     Prescriptions prior to admission  Medication Sig Dispense Refill Last Dose  . carvedilol (COREG) 6.25 MG tablet Take 1 tablet (6.25 mg total) by mouth 2 (two) times daily. 180 tablet 3 01/25/2015 at 1800  . dicyclomine (BENTYL) 10 MG capsule 1 po 30 mins before meals up to three times a day or q4h prn for abdominal pain/diarrhea. NO MORE 8 DOSES IN A DAY. (Patient taking differently: Take 10 mg by mouth daily as needed. 1 po 30  mins before meals up to three times a day or q4h prn for abdominal pain/diarrhea. NO MORE 8 DOSES IN A DAY.) 45 capsule 11 01/25/2015 at Unknown time  . dronabinol (MARINOL) 2.5 MG capsule Take 1 capsule (2.5 mg total) by mouth 2 (two) times daily before a meal. 60 capsule 0 01/25/2015 at Unknown time  . DULoxetine (CYMBALTA) 30 MG capsule Take 30 mg by mouth 2 (two) times daily.   01/25/2015 at Unknown time  . ELIQUIS 5 MG TABS tablet TAKE 1 TABLET BY MOUTH TWICE DAILY. 60 tablet 3 01/23/2015  . furosemide (LASIX) 20 MG tablet Take 1 tablet (20 mg total) by mouth 2 (two) times daily. 180 tablet 3 01/25/2015 at Unknown time  . glipiZIDE (GLUCOTROL) 10 MG tablet Take 10 mg by mouth daily before breakfast.    01/25/2015 at Unknown time  . Insulin Glargine (LANTUS SOLOSTAR) 100 UNIT/ML Solostar Pen Inject 20-50 Units into the skin at bedtime as needed. Patient takes 20 units every night at bedtime if over 178.  Takes 50 units if her blood sugar is above 300.   Past Month at Unknown time  . Iron-FA-B Cmp-C-Biot-Probiotic (FUSION PLUS PO) Take 1 tablet by mouth daily.    01/25/2015 at  Unknown time  . levETIRAcetam (KEPPRA) 250 MG tablet Take 1 tablet (250 mg total) by mouth 2 (two) times daily. 60 tablet 11 01/25/2015 at Unknown time  . levothyroxine (SYNTHROID, LEVOTHROID) 137 MCG tablet Take 137 mcg by mouth daily.   01/25/2015 at Unknown time  . lisinopril (PRINIVIL,ZESTRIL) 2.5 MG tablet Take 1 tablet (2.5 mg total) by mouth daily. 90 tablet 3 01/23/2015 at Unknown time  . loperamide (IMODIUM) 2 MG capsule TAKE (1) CAPSULE THREE TIMES DAILY AS NEEDED FOR DIARRHEA OR LOOSE STOOLS. 30 capsule 1 Past Month at Unknown time  . LORazepam (ATIVAN) 1 MG tablet Take 1-2 mg by mouth daily as needed for anxiety or sleep. For anxiety   01/25/2015 at Unknown time  . magnesium oxide (MAG-OX) 400 MG tablet Take 1 tablet (400 mg total) by mouth daily. 30 tablet 6 01/25/2015 at Unknown time  . metaxalone (SKELAXIN) 800 MG tablet  Take 800 mg by mouth 2 (two) times daily.    01/25/2015 at Unknown time  . metFORMIN (GLUCOPHAGE) 500 MG tablet Take 1,000 mg by mouth 2 (two) times daily with a meal.    01/24/2015  . Multiple Vitamin (MULTIVITAMIN WITH MINERALS) TABS tablet Take 1 tablet by mouth daily.   01/25/2015 at Unknown time  . nitroGLYCERIN (NITROSTAT) 0.4 MG SL tablet Place 0.4 mg under the tongue every 5 (five) minutes as needed for chest pain. For chest pain    Taking  . omeprazole (PRILOSEC) 20 MG capsule Take 20 mg by mouth 2 (two) times daily.     01/25/2015 at Unknown time  . ondansetron (ZOFRAN) 4 MG tablet Take 4 mg by mouth every 6 (six) hours as needed. For nausea   01/26/2015 at 0630  . POLY-IRON 150 150 MG capsule TAKE 1 CAPSULE BY MOUTH 2 TIMES DAILY. 60 capsule 3 01/25/2015 at Unknown time  . potassium chloride (K-DUR) 10 MEQ tablet Take 10 mEq by mouth 2 (two) times daily.    01/25/2015 at Unknown time  . vitamin B-12 (CYANOCOBALAMIN) 500 MCG tablet Take 500 mcg by mouth daily.   01/25/2015 at Unknown time    Physical Exam: Pulse 160, temperature 97.8 F (36.6 C), temperature source Oral, resp. rate 18, height 5\' 6"  (1.676 m), weight 190 lb (86.183 kg), SpO2 98 %.  General: NAD, obese Neck: JVP 14 cm, no thyromegaly or thyroid nodule.  Lungs: Clear to auscultation bilaterally with normal respiratory effort. CV: Nondisplaced PMI.  Heart irregular S1/S2, no S3/S4, no murmur.  1+ ankle edema.  No carotid bruit.  Normal pedal pulses.  Abdomen: Soft, nontender, no hepatosplenomegaly, mild distention.  Neurologic: Alert and oriented x 3.  Psych: Normal affect. Extremities: No clubbing or cyanosis.   Labs:   Lab Results  Component Value Date   WBC 6.3 01/26/2015   HGB 11.1* 01/26/2015   HCT 36.9 01/26/2015   MCV 84.4 01/26/2015   PLT 166 01/26/2015    Recent Labs Lab 01/26/15 0920  NA 139  K 4.6  CL 106  CO2 28  BUN 19  CREATININE 0.60  CALCIUM 9.4  GLUCOSE 170*   Lab Results  Component Value  Date   CKTOTAL 71 07/15/2009   CKMB 2.9 07/15/2009   TROPONINI 0.03 07/20/2014    Lab Results  Component Value Date   CHOL 77 09/02/2011   CHOL  05/30/2009    136        ATP III CLASSIFICATION:  <200     mg/dL   Desirable  200-239  mg/dL   Borderline High  >=592    mg/dL   High          CHOL 924 05/02/2009   Lab Results  Component Value Date   HDL 35* 09/02/2011   HDL 37* 05/30/2009   HDL 44 05/02/2009   Lab Results  Component Value Date   LDLCALC 24 09/02/2011   LDLCALC  05/30/2009    60        Total Cholesterol/HDL:CHD Risk Coronary Heart Disease Risk Table                     Men   Women  1/2 Average Risk   3.4   3.3  Average Risk       5.0   4.4  2 X Average Risk   9.6   7.1  3 X Average Risk  23.4   11.0        Use the calculated Patient Ratio above and the CHD Risk Table to determine the patient's CHD Risk.        ATP III CLASSIFICATION (LDL):  <100     mg/dL   Optimal  462-863  mg/dL   Near or Above                    Optimal  130-159  mg/dL   Borderline  817-711  mg/dL   High  >657     mg/dL   Very High   LDLCALC 59 05/02/2009   Lab Results  Component Value Date   TRIG 88 09/02/2011   TRIG 194* 05/30/2009   TRIG 190 05/02/2009   Lab Results  Component Value Date   CHOLHDL 2.2 09/02/2011   CHOLHDL 3.7 05/30/2009   CHOLHDL 4.1 Ratio 04/09/2009   No results found for: LDLDIRECT     ASSESSMENT AND PLAN: 64 yo with history of chronic atrial fibrillation, chronic systolic CHF (EF 90%, nonischemic cardiomyopathy), and pulmonary hypertension by echo presents for right heart cath. I discussed risks and benefits of the procedure with her and she agrees to proceed.   Signed: Marca Ancona 01/26/2015, 9:51 AM

## 2015-01-26 NOTE — Progress Notes (Signed)
63F foley inserted w/o difficulty. Draining clear light yellow uop. Visiting w/sister. Denies discomfort.

## 2015-01-27 ENCOUNTER — Encounter (HOSPITAL_COMMUNITY): Payer: Self-pay | Admitting: Cardiology

## 2015-01-27 ENCOUNTER — Inpatient Hospital Stay (HOSPITAL_COMMUNITY): Payer: Medicare Other

## 2015-01-27 DIAGNOSIS — I5043 Acute on chronic combined systolic (congestive) and diastolic (congestive) heart failure: Secondary | ICD-10-CM

## 2015-01-27 DIAGNOSIS — I509 Heart failure, unspecified: Secondary | ICD-10-CM | POA: Diagnosis not present

## 2015-01-27 DIAGNOSIS — I482 Chronic atrial fibrillation: Secondary | ICD-10-CM

## 2015-01-27 LAB — BASIC METABOLIC PANEL
Anion gap: 10 (ref 5–15)
BUN: 11 mg/dL (ref 6–20)
CO2: 31 mmol/L (ref 22–32)
CREATININE: 0.63 mg/dL (ref 0.44–1.00)
Calcium: 9.1 mg/dL (ref 8.9–10.3)
Chloride: 98 mmol/L — ABNORMAL LOW (ref 101–111)
Glucose, Bld: 173 mg/dL — ABNORMAL HIGH (ref 65–99)
Potassium: 3.3 mmol/L — ABNORMAL LOW (ref 3.5–5.1)
SODIUM: 139 mmol/L (ref 135–145)

## 2015-01-27 LAB — GLUCOSE, CAPILLARY
GLUCOSE-CAPILLARY: 168 mg/dL — AB (ref 65–99)
GLUCOSE-CAPILLARY: 220 mg/dL — AB (ref 65–99)
GLUCOSE-CAPILLARY: 151 mg/dL — AB (ref 65–99)
Glucose-Capillary: 119 mg/dL — ABNORMAL HIGH (ref 65–99)

## 2015-01-27 MED ORDER — POTASSIUM CHLORIDE CRYS ER 20 MEQ PO TBCR
40.0000 meq | EXTENDED_RELEASE_TABLET | Freq: Once | ORAL | Status: AC
  Administered 2015-01-27: 40 meq via ORAL
  Filled 2015-01-27: qty 2

## 2015-01-27 MED ORDER — CARVEDILOL 6.25 MG PO TABS
6.2500 mg | ORAL_TABLET | Freq: Two times a day (BID) | ORAL | Status: DC
  Administered 2015-01-27: 6.25 mg via ORAL
  Filled 2015-01-27: qty 1

## 2015-01-27 MED ORDER — CARVEDILOL 12.5 MG PO TABS
12.5000 mg | ORAL_TABLET | Freq: Two times a day (BID) | ORAL | Status: DC
  Administered 2015-01-27: 12.5 mg via ORAL
  Filled 2015-01-27: qty 1

## 2015-01-27 MED ORDER — LISINOPRIL 5 MG PO TABS
5.0000 mg | ORAL_TABLET | Freq: Every day | ORAL | Status: DC
  Administered 2015-01-27 – 2015-01-29 (×3): 5 mg via ORAL
  Filled 2015-01-27 (×3): qty 1

## 2015-01-27 MED ORDER — SPIRONOLACTONE 25 MG PO TABS
12.5000 mg | ORAL_TABLET | Freq: Every day | ORAL | Status: DC
  Administered 2015-01-27 – 2015-01-29 (×3): 12.5 mg via ORAL
  Filled 2015-01-27 (×3): qty 1

## 2015-01-27 MED FILL — Lidocaine HCl Local Preservative Free (PF) Inj 1%: INTRAMUSCULAR | Qty: 30 | Status: AC

## 2015-01-27 MED FILL — Heparin Sodium (Porcine) 2 Unit/ML in Sodium Chloride 0.9%: INTRAMUSCULAR | Qty: 500 | Status: AC

## 2015-01-27 NOTE — Progress Notes (Signed)
  Echocardiogram 2D Echocardiogram has been performed.  Cathie Beams 01/27/2015, 11:33 AM

## 2015-01-27 NOTE — Progress Notes (Addendum)
Inpatient Diabetes Program Recommendations  AACE/ADA: New Consensus Statement on Inpatient Glycemic Control (2013)  Target Ranges:  Prepandial:   less than 140 mg/dL      Peak postprandial:   less than 180 mg/dL (1-2 hours)      Critically ill patients:  140 - 180 mg/dL   Results for [name], [name] (MRN [id number]) as of 01/27/2015 13:47  Ref. Range 01/26/2015 11:58 01/26/2015 18:48 01/26/2015 21:26 01/27/2015 07:52 01/27/2015 12:31  Glucose-Capillary Latest Ref Range: 65-99 mg/dL 191 (H) 478 (H) 295 (H) 168 (H) 119 (H)   Reason for assessment: elevated CBG last night  Elevated blood sugars at bedtime yesterday because patient did not receive correction insulin as ordered- patient still requires Novolog correction if they are NPO.   Agree with current orders for Novolog moderate correction 0-15 units tid, 0-5 units qhs, Glucotrol 10mg  qday.     Susette Racer, RN, BA, MHA, CDE Diabetes Coordinator Inpatient Diabetes Program  [phone] (Team Pager) [phone] [facility] Office) 01/27/2015 1:54 PM

## 2015-01-27 NOTE — Care Management Note (Addendum)
Case Management Note  Patient Details  Name: [name] MRN: [id number] Date of Birth: 25-Feb-1951  Subjective/Objective:  Pt admitted for cardiac cath- revealed increased filling pressures. Plan for IV diuresis.                  Action/Plan: CM will speak with pt in reference to home needs. CM will continue to monitor for disposition needs.   Expected Discharge Date:                  Expected Discharge Plan:  Home w Home Health Services  In-House Referral:     Discharge planning Services  CM Consult  Post Acute Care Choice:    Choice offered to:     DME Arranged:    DME Agency:     HH Arranged:    HH Agency:     Status of Service:  In process, will continue to follow  Medicare Important Message Given:    Date Medicare IM Given:    Medicare IM give by:    Date Additional Medicare IM Given:    Additional Medicare Important Message give by:     If discussed at Long Length of Stay Meetings, dates discussed:    Additional Comments: 1049 01-29-15 Tomi Bamberger, RN,BSN [phone] Pt stable for d/c. No home needs identified. CM will sign off.   Gala Lewandowsky, RN 01/27/2015, 3:25 PM

## 2015-01-27 NOTE — Progress Notes (Signed)
Dr. Shirlee Latch updated with new order to decrease coreg to 6.25. Hold if SBP < 90 .

## 2015-01-27 NOTE — Progress Notes (Signed)
Patient ID: [name], female   DOB: Mar 28, 1951, 64 y.o.   MRN: [id number]   64 yo with history of chronic atrial fibrillation, HTN, chronic systolic CHF (nonischemic cardiomyopathy) with last EF 40%, and pulmonary hypertension by echo presented for outpatient RHC on 7/25 and was admitted with severe orthopnea, hypoxemia, and very high filling pressures.  She says that she has gained around 25 lbs over the last few weeks.   RHC Procedural Findings: Hemodynamics (mmHg) RA mean 23 RV 68/14 PA 70/36, mean 52 PCWP mean 31 Oxygen saturations: PA 60% AO 98% Cardiac Output (Fick) 4.54  Cardiac Index (Fick) 2.32  PVR 4.6 WU  She was diuresed yesterday with IV Lasix with good response.  Breathing is improved.  HR and BP running a bit high.   Scheduled Meds: . apixaban  5 mg Oral BID  . carvedilol  12.5 mg Oral BID WC  . cyanocobalamin  500 mcg Oral Daily  . dronabinol  2.5 mg Oral BID AC  . DULoxetine  30 mg Oral BID  . furosemide  80 mg Intravenous BID  . glipiZIDE  10 mg Oral QAC breakfast  . insulin aspart  0-15 Units Subcutaneous TID WC  . insulin aspart  0-5 Units Subcutaneous QHS  . levETIRAcetam  250 mg Oral BID  . levothyroxine  137 mcg Oral QAC breakfast  . lisinopril  5 mg Oral Daily  . magnesium oxide  400 mg Oral Daily  . metaxalone  800 mg Oral BID  . multivitamin with minerals  1 tablet Oral Daily  . pantoprazole  40 mg Oral Daily  . potassium chloride  40 mEq Oral Once  . sodium chloride  3 mL Intravenous Q12H  . spironolactone  12.5 mg Oral Daily   Continuous Infusions:  PRN Meds:.sodium chloride, acetaminophen, dicyclomine, LORazepam, ondansetron (ZOFRAN) IV, ondansetron, sodium chloride    Filed Vitals:   01/26/15 2000 01/26/15 2020 01/26/15 2154 01/27/15 0400  BP:  152/117    Pulse:  35 105   Temp: 99 F (37.2 C)   98.2 F (36.8 C)  TempSrc: Oral   Oral  Resp:  15    Height:      Weight:    176 lb 8 oz (80.06 kg)  SpO2:  94%      Intake/Output  Summary (Last 24 hours) at 01/27/15 0725 Last data filed at 01/27/15 0656  Gross per 24 hour  Intake      0 ml  Output   3400 ml  Net  -3400 ml    LABS: Basic Metabolic Panel:  Recent Labs  40/98/11 1140 01/27/15 0418  NA 140 139  K 4.6 3.3*  CL 108 98*  CO2 25 31  GLUCOSE 187* 173*  BUN 17 11  CREATININE 0.62 0.63  CALCIUM 9.4 9.1   Liver Function Tests:  Recent Labs  01/26/15 1140  AST 16  ALT 8*  ALKPHOS 59  BILITOT 0.6  PROT 6.9  ALBUMIN 3.8   No results for input(s): LIPASE, AMYLASE in the last 72 hours. CBC:  Recent Labs  01/26/15 0920 01/26/15 1140  WBC 6.3 6.9  NEUTROABS  --  4.0  HGB 11.1* 10.8*  HCT 36.9 36.2  MCV 84.4 85.0  PLT 166 163   Cardiac Enzymes: No results for input(s): CKTOTAL, CKMB, CKMBINDEX, TROPONINI in the last 72 hours. BNP: Invalid input(s): POCBNP D-Dimer: No results for input(s): DDIMER in the last 72 hours. Hemoglobin A1C: No results for input(s): HGBA1C in  the last 72 hours. Fasting Lipid Panel: No results for input(s): CHOL, HDL, LDLCALC, TRIG, CHOLHDL, LDLDIRECT in the last 72 hours. Thyroid Function Tests: No results for input(s): TSH, T4TOTAL, T3FREE, THYROIDAB in the last 72 hours.  Invalid input(s): FREET3 Anemia Panel: No results for input(s): VITAMINB12, FOLATE, FERRITIN, TIBC, IRON, RETICCTPCT in the last 72 hours.  RADIOLOGY: No results found.  PHYSICAL EXAM General: NAD Neck: JVP 14 cm, no thyromegaly or thyroid nodule.  Lungs: Clear to auscultation bilaterally with normal respiratory effort. CV: Nondisplaced PMI.  Heart irregular S1/S2, no S3/S4, no murmur.  1+ ankle edema.   Abdomen: Soft, nontender, no hepatosplenomegaly, mild distention.  Neurologic: Alert and oriented x 3.  Psych: Normal affect. Extremities: No clubbing or cyanosis.   TELEMETRY: Reviewed telemetry pt in atrial fibrillation, rate 90s-110  ASSESSMENT AND PLAN: 64 yo with history of chronic atrial fibrillation, HTN, chronic  systolic CHF (nonischemic cardiomyopathy) with last EF 40%, and pulmonary hypertension by echo presented for outpatient RHC on 7/25 and was admitted with severe orthopnea, hypoxemia, and very high filling pressures. 1. Acute on chronic systolic CHF: EF 29% with diffuse hypokinesis on last echo.  LHC in 7/15 with no significant CAD (nonischemic CMP).  ?Related in part to poor control of atrial fibrillation rate; HTN may also play a role. Very high filling pressures with preserved cardiac output on RHC.  She diuresed well yesterday with IV Lasix, still volume overloaded.  - Continue Lasix 80 mg IV bid. Replete K.  - Can increase lisinopril to 5 mg daily.  - Will add spironolactone 12.5 mg daily.  - Increase Coreg to 12.5 mg bid with elevated HR.  - Echo today.  2. Atrial fibrillation: Chronic, HR in 90s-110s currently.   - Continue apixaban - Increase Coreg to 12.5 mg bid for rate control.  3. Pulmonary hypertension: Predominantly pulmonary venous hypertension but may be component of PAH.  Most important at this time will be diuresis.   Marca Ancona 01/27/2015 7:36 AM

## 2015-01-27 NOTE — Progress Notes (Signed)
UR Completed Daryl Beehler Graves-Bigelow, RN,BSN [phone]  

## 2015-01-27 NOTE — Progress Notes (Signed)
PT BP at 80/50 taken manually on the Left arm. BP taken on the Right arm at 80/46. Will update MD accordingly.

## 2015-01-28 LAB — CBC
HCT: 33.5 % — ABNORMAL LOW (ref 36.0–46.0)
HEMATOCRIT: 34 % — AB (ref 36.0–46.0)
HEMOGLOBIN: 10.4 g/dL — AB (ref 12.0–15.0)
HEMOGLOBIN: 10.2 g/dL — AB (ref 12.0–15.0)
MCH: 25.4 pg — AB (ref 26.0–34.0)
MCH: 25.2 pg — ABNORMAL LOW (ref 26.0–34.0)
MCHC: 30.6 g/dL (ref 30.0–36.0)
MCHC: 30.4 g/dL (ref 30.0–36.0)
MCV: 83.1 fL (ref 78.0–100.0)
MCV: 82.9 fL (ref 78.0–100.0)
PLATELETS: 153 10*3/uL (ref 150–400)
Platelets: 158 10*3/uL (ref 150–400)
RBC: 4.09 MIL/uL (ref 3.87–5.11)
RBC: 4.04 MIL/uL (ref 3.87–5.11)
RDW: 22.2 % — ABNORMAL HIGH (ref 11.5–15.5)
RDW: 21.9 % — AB (ref 11.5–15.5)
WBC: 5.6 10*3/uL (ref 4.0–10.5)
WBC: 5.2 10*3/uL (ref 4.0–10.5)

## 2015-01-28 LAB — BASIC METABOLIC PANEL
Anion gap: 9 (ref 5–15)
Anion gap: 12 (ref 5–15)
BUN: 14 mg/dL (ref 6–20)
BUN: 14 mg/dL (ref 6–20)
CHLORIDE: 97 mmol/L — AB (ref 101–111)
CHLORIDE: 97 mmol/L — AB (ref 101–111)
CO2: 31 mmol/L (ref 22–32)
CO2: 27 mmol/L (ref 22–32)
CREATININE: 0.7 mg/dL (ref 0.44–1.00)
Calcium: 9.1 mg/dL (ref 8.9–10.3)
Calcium: 9.1 mg/dL (ref 8.9–10.3)
Creatinine, Ser: 0.78 mg/dL (ref 0.44–1.00)
GFR calc Af Amer: >60 mL/min (ref >60)
GFR calc Af Amer: >60 mL/min (ref >60)
GFR calc non Af Amer: >60 mL/min (ref >60)
GFR calc non Af Amer: >60 mL/min (ref >60)
GLUCOSE: 223 mg/dL — AB (ref 65–99)
GLUCOSE: 285 mg/dL — AB (ref 65–99)
POTASSIUM: 3.3 mmol/L — AB (ref 3.5–5.1)
Potassium: 3.5 mmol/L (ref 3.5–5.1)
Sodium: 137 mmol/L (ref 135–145)
Sodium: 136 mmol/L (ref 135–145)

## 2015-01-28 LAB — GLUCOSE, CAPILLARY
GLUCOSE-CAPILLARY: 224 mg/dL — AB (ref 65–99)
Glucose-Capillary: 191 mg/dL — ABNORMAL HIGH (ref 65–99)
Glucose-Capillary: 183 mg/dL — ABNORMAL HIGH (ref 65–99)
Glucose-Capillary: 150 mg/dL — ABNORMAL HIGH (ref 65–99)

## 2015-01-28 MED ORDER — CARVEDILOL 6.25 MG PO TABS
9.3750 mg | ORAL_TABLET | Freq: Two times a day (BID) | ORAL | Status: DC
  Administered 2015-01-28 – 2015-01-29 (×3): 9.375 mg via ORAL
  Filled 2015-01-28 (×6): qty 1

## 2015-01-28 MED ORDER — POTASSIUM CHLORIDE CRYS ER 20 MEQ PO TBCR
40.0000 meq | EXTENDED_RELEASE_TABLET | Freq: Once | ORAL | Status: AC
  Administered 2015-01-28: 40 meq via ORAL
  Filled 2015-01-28: qty 2

## 2015-01-28 MED ORDER — AMIODARONE HCL 200 MG PO TABS
200.0000 mg | ORAL_TABLET | Freq: Two times a day (BID) | ORAL | Status: DC
  Administered 2015-01-28 – 2015-01-29 (×3): 200 mg via ORAL
  Filled 2015-01-28 (×3): qty 1

## 2015-01-28 NOTE — Clinical Documentation Improvement (Signed)
Would you please clarify type of CHF?  You have systolic in H&P and first progress note and diastolic in second progress note.  Systolic___? Diastolic___?  Thank You, Harrie Jeans ,RN Clinical Documentation Specialist:    [facility]- Health Information Management [phone] Cell [phone]

## 2015-01-28 NOTE — Progress Notes (Signed)
Patient ID: [name], female   DOB: Apr 13, 1951, 64 y.o.   MRN: [id number]   64 yo with history of chronic atrial fibrillation, HTN, chronic systolic CHF (nonischemic cardiomyopathy) with EF 40%, and pulmonary hypertension by echo presented for outpatient RHC on 7/25 and was admitted with severe orthopnea, hypoxemia, and very high filling pressures.  She says that she has gained around 25 lbs over the last few weeks.   RHC Procedural Findings: Hemodynamics (mmHg) RA mean 23 RV 68/14 PA 70/36, mean 52 PCWP mean 31 Oxygen saturations: PA 60% AO 98% Cardiac Output (Fick) 4.54  Cardiac Index (Fick) 2.32  PVR 4.6 WU  Echo this admission showed EF 55-60%, moderate LVH, mild MR, PASP 46 mmHg, RV reported normal.   She was diuresed again yesterday with IV Lasix with good response.  Breathing is improved.  Weight continues to fall. HR still high in atrial fibrillation.   Scheduled Meds: . amiodarone  200 mg Oral BID  . apixaban  5 mg Oral BID  . carvedilol  9.375 mg Oral BID WC  . cyanocobalamin  500 mcg Oral Daily  . dronabinol  2.5 mg Oral BID AC  . DULoxetine  30 mg Oral BID  . furosemide  80 mg Intravenous BID  . glipiZIDE  10 mg Oral QAC breakfast  . insulin aspart  0-15 Units Subcutaneous TID WC  . insulin aspart  0-5 Units Subcutaneous QHS  . levETIRAcetam  250 mg Oral BID  . levothyroxine  137 mcg Oral QAC breakfast  . lisinopril  5 mg Oral Daily  . magnesium oxide  400 mg Oral Daily  . metaxalone  800 mg Oral BID  . multivitamin with minerals  1 tablet Oral Daily  . pantoprazole  40 mg Oral Daily  . potassium chloride  40 mEq Oral Once  . sodium chloride  3 mL Intravenous Q12H  . spironolactone  12.5 mg Oral Daily   Continuous Infusions:  PRN Meds:.sodium chloride, acetaminophen, dicyclomine, LORazepam, ondansetron (ZOFRAN) IV, ondansetron, sodium chloride    Filed Vitals:   01/27/15 1747 01/27/15 2000 01/27/15 2231 01/28/15 0551  BP: 102/60  123/69 137/87    Pulse:   86 93  Temp:  98.1 F (36.7 C)  98 F (36.7 C)  TempSrc:  Oral  Oral  Resp: 22  20 18   Height:      Weight:    171 lb 14.4 oz (77.973 kg)  SpO2:   95% 95%    Intake/Output Summary (Last 24 hours) at 01/28/15 0736 Last data filed at 01/28/15 0500  Gross per 24 hour  Intake      0 ml  Output   2800 ml  Net  -2800 ml    LABS: Basic Metabolic Panel:  Recent Labs  07/12/30 0418 01/28/15 0457  NA 139 137  K 3.3* 3.5  CL 98* 97*  CO2 31 31  GLUCOSE 173* 223*  BUN 11 14  CREATININE 0.63 0.70  CALCIUM 9.1 9.1   Liver Function Tests:  Recent Labs  01/26/15 1140  AST 16  ALT 8*  ALKPHOS 59  BILITOT 0.6  PROT 6.9  ALBUMIN 3.8   No results for input(s): LIPASE, AMYLASE in the last 72 hours. CBC:  Recent Labs  01/26/15 1140 01/28/15 0457  WBC 6.9 5.6  NEUTROABS 4.0  --   HGB 10.8* 10.4*  HCT 36.2 34.0*  MCV 85.0 83.1  PLT 163 153   Cardiac Enzymes: No results for input(s): CKTOTAL, CKMB,  CKMBINDEX, TROPONINI in the last 72 hours. BNP: Invalid input(s): POCBNP D-Dimer: No results for input(s): DDIMER in the last 72 hours. Hemoglobin A1C: No results for input(s): HGBA1C in the last 72 hours. Fasting Lipid Panel: No results for input(s): CHOL, HDL, LDLCALC, TRIG, CHOLHDL, LDLDIRECT in the last 72 hours. Thyroid Function Tests: No results for input(s): TSH, T4TOTAL, T3FREE, THYROIDAB in the last 72 hours.  Invalid input(s): FREET3 Anemia Panel: No results for input(s): VITAMINB12, FOLATE, FERRITIN, TIBC, IRON, RETICCTPCT in the last 72 hours.  RADIOLOGY: No results found.  PHYSICAL EXAM General: NAD Neck: JVP 10-12 cm, no thyromegaly or thyroid nodule.  Lungs: Clear to auscultation bilaterally with normal respiratory effort. CV: Nondisplaced PMI.  Heart irregular S1/S2, no S3/S4, no murmur.  Trace ankle edema.   Abdomen: Soft, nontender, no hepatosplenomegaly, mild distention.  Neurologic: Alert and oriented x 3.  Psych: Normal  affect. Extremities: No clubbing or cyanosis.   TELEMETRY: Reviewed telemetry pt in atrial fibrillation, rate 90s-100s  ASSESSMENT AND PLAN: 64 yo with history of chronic atrial fibrillation, HTN, chronic systolic CHF (nonischemic cardiomyopathy) with last EF 40%, and pulmonary hypertension by echo presented for outpatient RHC on 7/25 and was admitted with severe orthopnea, hypoxemia, and very high filling pressures. 1. Acute on chronic diastolic CHF: EF 65% with diffuse hypokinesis on last echo prior to admission, but echo on 7/27 showed EF 55-60%.  RV was reported normal surprisingly, will need to review.  Very high filling pressures with preserved cardiac output on RHC.  She diuresed well again yesterday with IV Lasix, still some volume overload but improved.  - Continue Lasix 80 mg IV bid today, likely to po tomorrow. Replete K.  - Continue lisinopril/spironolactone.  2. Atrial fibrillation: Chronic, HR in 90s-100s currently.  She has been cardioverted in the past but appears to have been in persistent fibrillation for months now.  Given difficult to control CHF, I think that it would be reasonable to try to get her back in NSR.  - Increase Coreg to 9.375 mg bid for better rate control.  - Add amiodarone 200 mg bid.  Will continue amiodarone for a month, and at that time, can arrange for outpatient DCCV to see if we can get her back in NSR.  - Continue apixaban 3. Pulmonary hypertension: Predominantly pulmonary venous hypertension but may be component of PAH.  Most important at this time will be diuresis.   Marca Ancona 01/28/2015 7:36 AM

## 2015-01-29 DIAGNOSIS — I481 Persistent atrial fibrillation: Secondary | ICD-10-CM

## 2015-01-29 LAB — BASIC METABOLIC PANEL
ANION GAP: 9 (ref 5–15)
Anion gap: 7 (ref 5–15)
BUN: 17 mg/dL (ref 6–20)
BUN: 18 mg/dL (ref 6–20)
CALCIUM: 9.3 mg/dL (ref 8.9–10.3)
CHLORIDE: 99 mmol/L — AB (ref 101–111)
CO2: 30 mmol/L (ref 22–32)
CO2: 32 mmol/L (ref 22–32)
CREATININE: 0.78 mg/dL (ref 0.44–1.00)
CREATININE: 0.83 mg/dL (ref 0.44–1.00)
Calcium: 9.4 mg/dL (ref 8.9–10.3)
Chloride: 99 mmol/L — ABNORMAL LOW (ref 101–111)
GFR calc non Af Amer: >60 mL/min (ref >60)
GFR calc non Af Amer: >60 mL/min (ref >60)
Glucose, Bld: 161 mg/dL — ABNORMAL HIGH (ref 65–99)
Glucose, Bld: 195 mg/dL — ABNORMAL HIGH (ref 65–99)
Potassium: 3.6 mmol/L (ref 3.5–5.1)
Potassium: 3.7 mmol/L (ref 3.5–5.1)
Sodium: 138 mmol/L (ref 135–145)
Sodium: 138 mmol/L (ref 135–145)

## 2015-01-29 LAB — GLUCOSE, CAPILLARY: GLUCOSE-CAPILLARY: 156 mg/dL — AB (ref 65–99)

## 2015-01-29 LAB — BRAIN NATRIURETIC PEPTIDE: B NATRIURETIC PEPTIDE 5: 265.7 pg/mL — AB (ref 0.0–100.0)

## 2015-01-29 LAB — MAGNESIUM: Magnesium: 1.9 mg/dL (ref 1.7–2.4)

## 2015-01-29 MED ORDER — CARVEDILOL 3.125 MG PO TABS
9.3750 mg | ORAL_TABLET | Freq: Two times a day (BID) | ORAL | Status: DC
Start: 2015-01-29 — End: 2015-02-07

## 2015-01-29 MED ORDER — FUROSEMIDE 20 MG PO TABS: 60.0000 mg | ORAL_TABLET | Freq: Two times a day (BID) | ORAL | Status: DC

## 2015-01-29 MED ORDER — LISINOPRIL 5 MG PO TABS: 5.0000 mg | ORAL_TABLET | Freq: Every day | ORAL | Status: DC

## 2015-01-29 MED ORDER — FUROSEMIDE 20 MG PO TABS
60.0000 mg | ORAL_TABLET | Freq: Two times a day (BID) | ORAL | Status: DC
  Administered 2015-01-29: 60 mg via ORAL
  Filled 2015-01-29 (×2): qty 1

## 2015-01-29 MED ORDER — AMIODARONE HCL 200 MG PO TABS: 200.0000 mg | ORAL_TABLET | Freq: Two times a day (BID) | ORAL | Status: DC

## 2015-01-29 MED ORDER — POTASSIUM CHLORIDE CRYS ER 20 MEQ PO TBCR: 40.0000 meq | EXTENDED_RELEASE_TABLET | Freq: Once | ORAL | Status: DC

## 2015-01-29 MED ORDER — POTASSIUM CHLORIDE CRYS ER 20 MEQ PO TBCR
40.0000 meq | EXTENDED_RELEASE_TABLET | Freq: Once | ORAL | Status: DC
  Filled 2015-01-29: qty 2

## 2015-01-29 NOTE — Discharge Summary (Signed)
Advanced Heart Failure Team  Discharge Summary   Patient ID: [name] MRN: [id number], DOB/AGE: Nov 22, 1950 64 y.o. Admit date: 01/26/2015 D/C date:     01/29/2015   Primary Discharge Diagnoses:  1. Acute on chronic diastolic CHF - Previously systolic, EF recovered. 2. Atrial fibrillation: Chronic, HR in 80s-90s currently.  3. Pulmonary hypertension: Predominantly pulmonary venous hypertension but may be component of PAH.   Hospital Course:   [name] is a 64 y.o. history of chronic atrial fibrillation, chronic systolic CHF (EF 40%, nonischemic cardiomyopathy), and pulmonary hypertension by echo presents for right heart cath. She had right heart cath 1 year ago with only mild pulmonary hypertension. However, there was a significant discrepancy with PASP estimated by echo, so she was re-referred for RHC.   With very elevated filling pressures by cath, orthopnea, hypoxemia, and volume overload she was kept several days for diuresis. Previous echo showed EF 40% with diffuse hypokinesis, but echo on 7/27 showed EF 55-60%.RV was reported normal.For her Afib, she had been cardioverted in the past has been in persistent fibrillation for months.Amiodarone added with plans for outpatient DCCV.   She improved with IV diuresis and was thought stable for discharge. Overall she diuresed 13 lbs on IV lasix and was transitioned back to po at increase dosing from previous.  She will be discharged to home in stable condition with close follow up by Dr Wyline Mood and visit in the HF clinic on 02/19/15.  She will go home on following meds. Lasix 60 mg bid, KCl 40 daily, spironolactone 12.5 daily, lisinopril 5 mg daily, Coreg 9.375 mg bid, amiodarone 200 mg bid, apixaban 5 mg bid, noncardiac meds as prior to admission.     Discharge Weight Range:  Weight 170 Discharge Vitals: Blood pressure 110/63, pulse 92, temperature 97.8 F (36.6 C), temperature source Oral, resp. rate 18, height 5\' 6"  (1.676 m),  weight 170 lb 3.2 oz (77.202 kg), SpO2 95 %.  Labs: Lab Results  Component Value Date   WBC 5.2 01/28/2015   HGB 10.2* 01/28/2015   HCT 33.5* 01/28/2015   MCV 82.9 01/28/2015   PLT 158 01/28/2015    Recent Labs Lab 01/26/15 1140  01/29/15 0410  NA 140  < > 138  K 4.6  < > 3.6  CL 108  < > 99*  CO2 25  < > 30  BUN 17  < > 17  CREATININE 0.62  < > 0.78  CALCIUM 9.4  < > 9.3  PROT 6.9  --   --   BILITOT 0.6  --   --   ALKPHOS 59  --   --   ALT 8*  --   --   AST 16  --   --   GLUCOSE 187*  < > 161*  < > = values in this interval not displayed. Lab Results  Component Value Date   CHOL 77 09/02/2011   HDL 35* 09/02/2011   LDLCALC 24 09/02/2011   TRIG 88 09/02/2011   BNP (last 3 results)  Recent Labs  07/20/14 1702 01/26/15 1149 01/29/15 0410  BNP 576.0* 566.0* 265.7*    ProBNP (last 3 results)  Recent Labs  04/04/14 2327  PROBNP 1689.0*     Diagnostic Studies/Procedures   No results found.  Discharge Medications     Medication List    STOP taking these medications        potassium chloride 10 MEQ tablet  Commonly known as:  K-DUR  TAKE these medications        amiodarone 200 MG tablet  Commonly known as:  PACERONE  Take 1 tablet (200 mg total) by mouth 2 (two) times daily.     carvedilol 3.125 MG tablet  Commonly known as:  COREG  Take 3 tablets (9.375 mg total) by mouth 2 (two) times daily with a meal.     dicyclomine 10 MG capsule  Commonly known as:  BENTYL  1 po 30 mins before meals up to three times a day or q4h prn for abdominal pain/diarrhea. NO MORE 8 DOSES IN A DAY.     dronabinol 2.5 MG capsule  Commonly known as:  MARINOL  Take 1 capsule (2.5 mg total) by mouth 2 (two) times daily before a meal.     DULoxetine 30 MG capsule  Commonly known as:  CYMBALTA  Take 30 mg by mouth 2 (two) times daily.     ELIQUIS 5 MG Tabs tablet  Generic drug:  apixaban  TAKE 1 TABLET BY MOUTH TWICE DAILY.     furosemide 20 MG tablet   Commonly known as:  LASIX  Take 3 tablets (60 mg total) by mouth 2 (two) times daily.     FUSION PLUS PO  Take 1 tablet by mouth daily.     glipiZIDE 10 MG tablet  Commonly known as:  GLUCOTROL  Take 10 mg by mouth daily before breakfast.     LANTUS SOLOSTAR 100 UNIT/ML Solostar Pen  Generic drug:  Insulin Glargine  Inject 20-50 Units into the skin at bedtime as needed. Patient takes 20 units every night at bedtime if over 178.  Takes 50 units if her blood sugar is above 300.     levETIRAcetam 250 MG tablet  Commonly known as:  KEPPRA  Take 1 tablet (250 mg total) by mouth 2 (two) times daily.     levothyroxine 137 MCG tablet  Commonly known as:  SYNTHROID, LEVOTHROID  Take 137 mcg by mouth daily.     lisinopril 5 MG tablet  Commonly known as:  PRINIVIL,ZESTRIL  Take 1 tablet (5 mg total) by mouth daily.     loperamide 2 MG capsule  Commonly known as:  IMODIUM  TAKE (1) CAPSULE THREE TIMES DAILY AS NEEDED FOR DIARRHEA OR LOOSE STOOLS.     LORazepam 1 MG tablet  Commonly known as:  ATIVAN  Take 1-2 mg by mouth daily as needed for anxiety or sleep. For anxiety     magnesium oxide 400 MG tablet  Commonly known as:  MAG-OX  Take 1 tablet (400 mg total) by mouth daily.     metaxalone 800 MG tablet  Commonly known as:  SKELAXIN  Take 800 mg by mouth 2 (two) times daily.     metFORMIN 500 MG tablet  Commonly known as:  GLUCOPHAGE  Take 1,000 mg by mouth 2 (two) times daily with a meal.     multivitamin with minerals Tabs tablet  Take 1 tablet by mouth daily.     nitroGLYCERIN 0.4 MG SL tablet  Commonly known as:  NITROSTAT  Place 0.4 mg under the tongue every 5 (five) minutes as needed for chest pain. For chest pain     omeprazole 20 MG capsule  Commonly known as:  PRILOSEC  Take 20 mg by mouth 2 (two) times daily.     ondansetron 4 MG tablet  Commonly known as:  ZOFRAN  Take 4 mg by mouth every 6 (six) hours as needed. For  nausea     POLY-IRON 150 150 MG  capsule  Generic drug:  iron polysaccharides  TAKE 1 CAPSULE BY MOUTH 2 TIMES DAILY.     potassium chloride SA 20 MEQ tablet  Commonly known as:  K-DUR,KLOR-CON  Take 2 tablets (40 mEq total) by mouth once.     vitamin B-12 500 MCG tablet  Commonly known as:  CYANOCOBALAMIN  Take 500 mcg by mouth daily.        Disposition   The patient will be discharged in stable condition to home. Discharge Instructions    ACE Inhibitor / ARB already ordered    Complete by:  As directed      Diet - low sodium heart healthy    Complete by:  As directed      Heart Failure patients record your daily weight using the same scale at the same time of day    Complete by:  As directed      Increase activity slowly    Complete by:  As directed           Follow-up Information    Follow up with Marca Ancona, MD On 02/19/2015.   Specialty:  Cardiology   Why:  at 1200 for HF follow up.  Please bring all of your medications with you to your visit.  Patient parking code is Arboriculturist information:   [id number] [place] [address] [phone]         Duration of Discharge Encounter: Greater than 35 minutes   Signed, Graciella Freer PA-C 01/29/2015, 10:04 AM

## 2015-01-29 NOTE — Progress Notes (Signed)
Patient ID: [name], female   DOB: 11-24-1950, 64 y.o.   MRN: [id number]   64 yo with history of chronic atrial fibrillation, HTN, chronic systolic CHF (nonischemic cardiomyopathy) with EF 40%, and pulmonary hypertension by echo presented for outpatient RHC on 7/25 and was admitted with severe orthopnea, hypoxemia, and very high filling pressures.  She says that she has gained around 25 lbs over the last few weeks.   RHC Procedural Findings: Hemodynamics (mmHg) RA mean 23 RV 68/14 PA 70/36, mean 52 PCWP mean 31 Oxygen saturations: PA 60% AO 98% Cardiac Output (Fick) 4.54  Cardiac Index (Fick) 2.32  PVR 4.6 WU  Echo this admission showed EF 55-60%, moderate LVH, mild MR, PASP 46 mmHg, RV reported normal.   She was diuresed again yesterday with IV Lasix with good response.  Breathing is improved.  Weight continues to fall. HR controlled.  Scheduled Meds: . amiodarone  200 mg Oral BID  . apixaban  5 mg Oral BID  . carvedilol  9.375 mg Oral BID WC  . cyanocobalamin  500 mcg Oral Daily  . dronabinol  2.5 mg Oral BID AC  . DULoxetine  30 mg Oral BID  . furosemide  60 mg Oral BID  . glipiZIDE  10 mg Oral QAC breakfast  . insulin aspart  0-15 Units Subcutaneous TID WC  . insulin aspart  0-5 Units Subcutaneous QHS  . levETIRAcetam  250 mg Oral BID  . levothyroxine  137 mcg Oral QAC breakfast  . lisinopril  5 mg Oral Daily  . magnesium oxide  400 mg Oral Daily  . metaxalone  800 mg Oral BID  . multivitamin with minerals  1 tablet Oral Daily  . pantoprazole  40 mg Oral Daily  . potassium chloride  40 mEq Oral Once  . sodium chloride  3 mL Intravenous Q12H  . spironolactone  12.5 mg Oral Daily   Continuous Infusions:  PRN Meds:.sodium chloride, acetaminophen, dicyclomine, LORazepam, ondansetron (ZOFRAN) IV, ondansetron, sodium chloride    Filed Vitals:   01/28/15 2021 01/28/15 2100 01/29/15 0424 01/29/15 0901  BP: 81/52 99/54 123/59 110/63  Pulse: 71  92   Temp: 98.3 F  (36.8 C)  97.8 F (36.6 C)   TempSrc: Oral  Oral   Resp: 18  18   Height:      Weight:   170 lb 3.2 oz (77.202 kg)   SpO2: 100%  95%     Intake/Output Summary (Last 24 hours) at 01/29/15 0908 Last data filed at 01/29/15 0845  Gross per 24 hour  Intake    480 ml  Output   3255 ml  Net  -2775 ml    LABS: Basic Metabolic Panel:  Recent Labs  05/03/58 0850 01/29/15 0410  NA 136 138  K 3.3* 3.6  CL 97* 99*  CO2 27 30  GLUCOSE 285* 161*  BUN 14 17  CREATININE 0.78 0.78  CALCIUM 9.1 9.3   Liver Function Tests:  Recent Labs  01/26/15 1140  AST 16  ALT 8*  ALKPHOS 59  BILITOT 0.6  PROT 6.9  ALBUMIN 3.8   No results for input(s): LIPASE, AMYLASE in the last 72 hours. CBC:  Recent Labs  01/26/15 1140 01/28/15 0457 01/28/15 0850  WBC 6.9 5.6 5.2  NEUTROABS 4.0  --   --   HGB 10.8* 10.4* 10.2*  HCT 36.2 34.0* 33.5*  MCV 85.0 83.1 82.9  PLT 163 153 158   Cardiac Enzymes: No results for input(s):  CKTOTAL, CKMB, CKMBINDEX, TROPONINI in the last 72 hours. BNP: Invalid input(s): POCBNP D-Dimer: No results for input(s): DDIMER in the last 72 hours. Hemoglobin A1C: No results for input(s): HGBA1C in the last 72 hours. Fasting Lipid Panel: No results for input(s): CHOL, HDL, LDLCALC, TRIG, CHOLHDL, LDLDIRECT in the last 72 hours. Thyroid Function Tests: No results for input(s): TSH, T4TOTAL, T3FREE, THYROIDAB in the last 72 hours.  Invalid input(s): FREET3 Anemia Panel: No results for input(s): VITAMINB12, FOLATE, FERRITIN, TIBC, IRON, RETICCTPCT in the last 72 hours.  RADIOLOGY: No results found.  PHYSICAL EXAM General: NAD Neck: JVP 7 cm, no thyromegaly or thyroid nodule.  Lungs: Clear to auscultation bilaterally with normal respiratory effort. CV: Nondisplaced PMI.  Heart irregular S1/S2, no S3/S4, no murmur.  Trace ankle edema.   Abdomen: Soft, nontender, no hepatosplenomegaly, mild distention.  Neurologic: Alert and oriented x 3.  Psych: Normal  affect. Extremities: No clubbing or cyanosis.   TELEMETRY: Reviewed telemetry pt in atrial fibrillation, rate 80s-90s  ASSESSMENT AND PLAN: 64 yo with history of chronic atrial fibrillation, HTN, chronic systolic CHF (nonischemic cardiomyopathy) with last EF 40%, and pulmonary hypertension by echo presented for outpatient RHC on 7/25 and was admitted with severe orthopnea, hypoxemia, and very high filling pressures. 1. Acute on chronic diastolic CHF: EF 96% with diffuse hypokinesis on last echo prior to admission, but echo on 7/27 showed EF 55-60%.  RV was reported normal surprisingly, will need to review.  Very high filling pressures with preserved cardiac output on RHC.  She diuresed well again yesterday with IV Lasix, she now looks near-euvolemic.  - Transition to Lasix 60 mg po bid.  May need to decrease this as outpatient but will send home on this dose for now.  - Continue lisinopril/spironolactone.  2. Atrial fibrillation: Chronic, HR in 80s-90s currently.  She has been cardioverted in the past but appears to have been in persistent fibrillation for months now.  Given difficult to control CHF, I think that it would be reasonable to try to get her back in NSR.  - Continue Coreg 9.375 mg bid for rate control.  - I added amiodarone 200 mg bid.  Will continue amiodarone for a month, and at that time, will arrange for outpatient DCCV to see if we can get her back in NSR.  - Continue apixaban 3. Pulmonary hypertension: Predominantly pulmonary venous hypertension but may be component of PAH.  Most important at this time will be diuresis.  4. Disposition: I think that Mrs [name] can go home today.  She has an appt with Dr [name] on Monday.  She will need a CMET that day.  I will see her back in 2 wks in CHF clinic, will likely arrange DCCV at that time.  Cardiac meds for home: Lasix 60 mg bid, KCl 40 daily, spironolactone 12.5 daily, lisinopril 5 mg daily, Coreg 9.375 mg bid, amiodarone 200 mg bid,  apixaban 5 mg bid, noncardiac meds as prior to admission.   Marca Ancona 01/29/2015 9:08 AM

## 2015-01-29 NOTE — Care Management Important Message (Signed)
Important Message  Patient Details  Name: MERDIS SLATE MRN: [id number] Date of Birth: 26-Jan-1951   Medicare Important Message Given:  Yes-second notification given    Yvonna Alanis 01/29/2015, 12:32 PM

## 2015-02-02 ENCOUNTER — Ambulatory Visit (INDEPENDENT_AMBULATORY_CARE_PROVIDER_SITE_OTHER): Payer: Medicare Other | Admitting: Cardiology

## 2015-02-02 ENCOUNTER — Encounter: Payer: Self-pay | Admitting: Cardiology

## 2015-02-02 VITALS — BP 118/78 | HR 81 | Ht 66.0 in | Wt 170.0 lb

## 2015-02-02 DIAGNOSIS — I482 Chronic atrial fibrillation, unspecified: Secondary | ICD-10-CM

## 2015-02-02 DIAGNOSIS — I270 Primary pulmonary hypertension: Secondary | ICD-10-CM

## 2015-02-02 DIAGNOSIS — I5032 Chronic diastolic (congestive) heart failure: Secondary | ICD-10-CM

## 2015-02-02 DIAGNOSIS — I272 Pulmonary hypertension, unspecified: Secondary | ICD-10-CM

## 2015-02-02 NOTE — Patient Instructions (Signed)
Your physician wants you to follow-up in: 4 months with Dr Lurena Joiner will receive a reminder letter in the mail two months in advance. If you don't receive a letter, please call our office to schedule the follow-up appointment.  Please get lab work done  Your physician recommends that you continue on your current medications as directed. Please refer to the Current Medication list given to you today.    Thank you for choosing [facility] !

## 2015-02-02 NOTE — Progress Notes (Signed)
Patient ID: [name], female   DOB: May 12, 1951, 64 y.o.   MRN: [id number]     Clinical Summary [name] is a 64 y.o.female seen today for hospital follow up. This is a focused appointment on her recent admission with acute on chronic diastolic heart failure and pulmonary HTN.   1. Chronicsystolic/ diastolic CHF with mixed precap and post postcap Pulmonary Hypertension - echo 12/2013 PASP 85, moderate TR, RV mild to moderately dilated with normal function, could not eval diasotlic function due to afib, LVEF 50-55%. Biatrial enlargement.  - RHC 01/2015 PA 38/13 and calculated mean of 21, could not get a wedge however LVEDP 15. - repeat echo 10/2014 PASP 61 mmHg (down from 85 on prior echo), some evidence of RV dysfunction with RV TAPSE 1.4 and tissue anular systolic velocity of 7.5. Cannot evaluate diastolic function, however severe biatrial enlarement suggests significant dysfunction, - 01/2015 RHC mean PA 52, PWCP 31 - 01/2015 echo LVEF 55-60%, PASP 46   - after most recent RHC in 01/2015 she was admitted for diuresis. Discharge weight 170 lbs, reportedly down from 190 on admission. Marland Kitchen Discharged on lasix 60mg  bid.  - weight stable at 170 lbs. Compliant with lasix since discharged. Breathing stable since. Mild LE edema, mild abdominal swelling. Notes some generalized fatigue.   3. Afib - no recent palpitations - compliant with metoprolol and eliquis - recently started on amiodarone during recent admission    Past Medical History  Diagnosis Date  . Chest pain     Negative cardiac catheterization in 2002; negative stress nuclear study in 2008  . Congestive heart failure     Normal LV systolic function  . PAF (paroxysmal atrial fibrillation) 2009  . Chronic anticoagulation   . Diabetes mellitus, type 2     Insulin therapy; exacerbated by prednisone  . Syncope     Admitted 05/2009; magnetic resonance imagin/ MRA - negative; etiology thought to be orthostasis secondary to drugs and  dehydration  . GERD (gastroesophageal reflux disease)   . IBS (irritable bowel syndrome)   . Hiatal hernia   . Gastroparesis     99% retention 05/2008 on GES  . Hyperlipidemia   . Hypertension   . Hypothyroid   . Chronic LBP     Surgical intervention in 1996  . Obesity   . OSA on CPAP   . Anemia     H/H of 10/30 with a normal MCV in 12/09  . Barrett's esophagus     Diagnosed 1995. Last EGD 2016-NO BARRETT'S.   . Seizures   . Pulmonary hypertension 01/2015  . Shortness of breath dyspnea      Allergies  Allergen Reactions  . Ibuprofen     Upsets her stomach and causes abdominal pain  . Celexa [Citalopram Hydrobromide] Other (See Comments)    Dyskinesia  . Codeine Nausea And Vomiting    HALLUCINATIONS  . Reglan [Metoclopramide] Other (See Comments)    DYSKINESIA     Current Outpatient Prescriptions  Medication Sig Dispense Refill  . amiodarone (PACERONE) 200 MG tablet Take 1 tablet (200 mg total) by mouth 2 (two) times daily. 60 tablet 6  . carvedilol (COREG) 3.125 MG tablet Take 3 tablets (9.375 mg total) by mouth 2 (two) times daily with a meal. 180 tablet 6  . dicyclomine (BENTYL) 10 MG capsule 1 po 30 mins before meals up to three times a day or q4h prn for abdominal pain/diarrhea. NO MORE 8 DOSES IN A DAY. (Patient taking differently:  Take 10 mg by mouth daily as needed. 1 po 30 mins before meals up to three times a day or q4h prn for abdominal pain/diarrhea. NO MORE 8 DOSES IN A DAY.) 45 capsule 11  . dronabinol (MARINOL) 2.5 MG capsule Take 1 capsule (2.5 mg total) by mouth 2 (two) times daily before a meal. 60 capsule 0  . DULoxetine (CYMBALTA) 30 MG capsule Take 30 mg by mouth 2 (two) times daily.    Marland Kitchen ELIQUIS 5 MG TABS tablet TAKE 1 TABLET BY MOUTH TWICE DAILY. 60 tablet 3  . furosemide (LASIX) 20 MG tablet Take 3 tablets (60 mg total) by mouth 2 (two) times daily. 180 tablet 6  . glipiZIDE (GLUCOTROL) 10 MG tablet Take 10 mg by mouth daily before breakfast.     .  Insulin Glargine (LANTUS SOLOSTAR) 100 UNIT/ML Solostar Pen Inject 20-50 Units into the skin at bedtime as needed. Patient takes 20 units every night at bedtime if over 178.  Takes 50 units if her blood sugar is above 300.    . Iron-FA-B Cmp-C-Biot-Probiotic (FUSION PLUS PO) Take 1 tablet by mouth daily.     Marland Kitchen levETIRAcetam (KEPPRA) 250 MG tablet Take 1 tablet (250 mg total) by mouth 2 (two) times daily. 60 tablet 11  . levothyroxine (SYNTHROID, LEVOTHROID) 137 MCG tablet Take 137 mcg by mouth daily.    Marland Kitchen lisinopril (PRINIVIL,ZESTRIL) 5 MG tablet Take 1 tablet (5 mg total) by mouth daily. 30 tablet 6  . loperamide (IMODIUM) 2 MG capsule TAKE (1) CAPSULE THREE TIMES DAILY AS NEEDED FOR DIARRHEA OR LOOSE STOOLS. 30 capsule 1  . LORazepam (ATIVAN) 1 MG tablet Take 1-2 mg by mouth daily as needed for anxiety or sleep. For anxiety    . magnesium oxide (MAG-OX) 400 MG tablet Take 1 tablet (400 mg total) by mouth daily. 30 tablet 6  . metaxalone (SKELAXIN) 800 MG tablet Take 800 mg by mouth 2 (two) times daily.     . metFORMIN (GLUCOPHAGE) 500 MG tablet Take 1,000 mg by mouth 2 (two) times daily with a meal.     . Multiple Vitamin (MULTIVITAMIN WITH MINERALS) TABS tablet Take 1 tablet by mouth daily.    . nitroGLYCERIN (NITROSTAT) 0.4 MG SL tablet Place 0.4 mg under the tongue every 5 (five) minutes as needed for chest pain. For chest pain     . omeprazole (PRILOSEC) 20 MG capsule Take 20 mg by mouth 2 (two) times daily.      . ondansetron (ZOFRAN) 4 MG tablet Take 4 mg by mouth every 6 (six) hours as needed. For nausea    . POLY-IRON 150 150 MG capsule TAKE 1 CAPSULE BY MOUTH 2 TIMES DAILY. 60 capsule 3  . potassium chloride SA (K-DUR,KLOR-CON) 20 MEQ tablet Take 2 tablets (40 mEq total) by mouth once. 60 tablet 6  . vitamin B-12 (CYANOCOBALAMIN) 500 MCG tablet Take 500 mcg by mouth daily.     No current facility-administered medications for this visit.     Past Surgical History  Procedure  Laterality Date  . Cardiovascular stress test  2008    Stress nuclear study  . Back surgery  1996  . Laparoscopic cholecystectomy  1990s  . Total abdominal hysterectomy  1999  . Laminectomy  1995    L4-L5  . Carpal tunnel release  1994  . Esophagogastroduodenoscopy  2008    Barrett's without dysplasia. esphagus dilated. antral erosions, h.pylori serologies negative.  . Colonoscopy  11/2011    Dr.  fields: Internal hemorrhoids, mild diverticulosis. Random colon biopsies negative.  . Givens capsule study  12/07/2011    Proximal small bowel, rare AVM. Distal small bowel, multiple ulcers noted  . Esophagogastroduodenoscopy  11/2011    Dr. Darrick Penna: Barrett's esophagus, mild gastritis, diverticulum in the second portion of the duodenum repeat EGD 3 years. Small bowel biopsies negative. Gastric biopsy show reactive gastropathy but no H. pylori. Esophageal biopsies consistent with GERD. Next EGD 11/2014  . Givens capsule study N/A 09/27/2013    Distal small bowel ulcers extending to TI.  Marland Kitchen Givens capsule study N/A 10/10/2013    Procedure: GIVENS CAPSULE STUDY;  Surgeon: West Bali, MD;  Location: [facility];  Service: Endoscopy;  Laterality: N/A;  7:30  . Colonoscopy N/A 11/08/2013    SLF: Normal mucosa in the terminal ileum/The colon IS redundant/  Moderate diverticulosis throughout the entire colon. ileum bx benign. colon bx benign  . Esophageal biopsy N/A 11/08/2013    Procedure: BIOPSY  / Tissue sampling / ulcers present in small intestine;  Surgeon: West Bali, MD;  Location: [facility];  Service: Endoscopy;  Laterality: N/A;  . Cardiac catheterization  2002  . Right heart catheterization N/A 01/10/2014    Procedure: RIGHT HEART CATH;  Surgeon: Lesleigh Noe, MD;  Location: [facility] CATH LAB;  Service: Cardiovascular;  Laterality: N/A;  . Left heart catheterization with coronary angiogram  01/10/2014    Procedure: LEFT HEART CATHETERIZATION WITH CORONARY ANGIOGRAM;  Surgeon: Lesleigh Noe,  MD;  Location: [facility] CATH LAB;  Service: Cardiovascular;;  . Esophagogastroduodenoscopy N/A 11/21/2014    Procedure: ESOPHAGOGASTRODUODENOSCOPY (EGD);  Surgeon: West Bali, MD;  Location: [facility];  Service: Endoscopy;  Laterality: N/A;  12:00 - moved to 12:45 - office to notify  . Cardiac catheterization N/A 01/26/2015    Procedure: Right Heart Cath;  Surgeon: Laurey Morale, MD;  Location: [facility] INVASIVE CV LAB;  Service: Cardiovascular;  Laterality: N/A;     Allergies  Allergen Reactions  . Ibuprofen     Upsets her stomach and causes abdominal pain  . Celexa [Citalopram Hydrobromide] Other (See Comments)    Dyskinesia  . Codeine Nausea And Vomiting    HALLUCINATIONS  . Reglan [Metoclopramide] Other (See Comments)    DYSKINESIA      Family History  Problem Relation Age of Onset  . Hypertension Mother   . Alzheimer's disease Mother   . Stroke Mother   . Heart attack Mother   . Heart disease Neg Hx   . Hypertension Other   . Colon cancer Neg Hx      Social History Ms. Graefe reports that she quit smoking about 31 years ago. She started smoking about 48 years ago. She has never used smokeless tobacco. Ms. Treanor reports that she does not drink alcohol.   Review of Systems CONSTITUTIONAL: generalized fatigue HEENT: Eyes: No visual loss, blurred vision, double vision or yellow sclerae.No hearing loss, sneezing, congestion, runny nose or sore throat.  SKIN: No rash or itching.  CARDIOVASCULAR: per HPI RESPIRATORY: No shortness of breath, cough or sputum.  GASTROINTESTINAL: No anorexia, nausea, vomiting or diarrhea. No abdominal pain or blood.  GENITOURINARY: No burning on urination, no polyuria NEUROLOGICAL: No headache, dizziness, syncope, paralysis, ataxia, numbness or tingling in the extremities. No change in bowel or bladder control.  MUSCULOSKELETAL: No muscle, back pain, joint pain or stiffness.  LYMPHATICS: No enlarged nodes. No history of splenectomy.    PSYCHIATRIC: No history  of depression or anxiety.  ENDOCRINOLOGIC: No reports of sweating, cold or heat intolerance. No polyuria or polydipsia.  Marland Kitchen   Physical Examination Filed Vitals:   02/02/15 1140  BP: 118/78  Pulse: 81   Filed Vitals:   02/02/15 1140  Height:  (1.676 m)  Weight: 170 lb (77.111 kg)    Gen: resting comfortably, no acute distress HEENT: no scleral icterus, pupils equal round and reactive, no palptable cervical adenopathy,  CV: irreg, no m/r/g, no JVD Resp: Clear to auscultation bilaterally GI: abdomen is soft, non-tender, mildly distended, normal bowel sounds, no hepatosplenomegaly MSK: extremities are warm, no edema.  Skin: warm, no rash Neuro:  no focal deficits Psych: appropriate affect   Diagnostic Studies  12/2013 Echo Study Conclusions  - Limited study to evaluate LV systolic function. - Left ventricle: The cavity size was normal. Wall thickness was normal. Systolic function was normal. The estimated ejection fraction was in the range of 50% to 55%. Wall motion was normal; there were no regional wall motion abnormalities. The study was not technically sufficient to allow evaluation of LV diastolic dysfunction due to atrial fibrillation. - Aortic valve: Valve area (VTI): 2.49 cm^2. Valve area (Vmax): 2.11 cm^2. - Mitral valve: There was moderate regurgitation. The MR vena contracta is 0.4 cm. - Left atrium: The atrium was severely dilated. - Right ventricle: The cavity size was mildly to moderately dilated. - Right atrium: The atrium was severely dilated. - Atrial septum: From available images no evidence of ASD or PFO by 2D or color Doppler images. - Tricuspid valve: There was moderate regurgitation. - Pulmonary arteries: Systolic pressure was severely increased. PA peak pressure: 85 mm Hg (S).  01/2014 RHC HEMODYNAMICS: Aortic pressure 123/73 mmHg; LV pressure 122/6 mmHg; LVEDP 15 mm mercury; RA 4 mmHg; RV 31/5;  PA 38/13 mmHg; PCWP(mean) unable to wedge the balloon tip catheter; Cardiac Output by Fick 4.73 L per minute  ANGIOGRAPHIC DATA: The left main coronary artery is normal.  The left anterior descending artery is widely patent, traverses the LV apex, gives origin to 2 diagonals, and has no significant obstruction..  The left circumflex artery is widely patent and gives a single large obtuse marginal Tarius Stangelo that trifurcates on the lateral wall. No significant obstruction.  The right coronary artery is dominant, gives 3 left ventricular branches and a large bifurcating PDA. No obstructions noted.Marland Kitchen  LEFT VENTRICULOGRAM: Left ventricular angiogram was done in the 30 RAO projection and revealed normal LV cavity size, tachycardia due to atrial fibrillation, and mild global reduction in LV function with an estimated ejection fraction of 40%   IMPRESSIONS: 1. Widely patent coronary arteries 2. Mild pulmonary hypertension 3. Mildly depressed LV systolic function with an estimated EF of 40% 3. This did not confirm significant pulmonary hypertension. The procedure did suggest that the right atrium and right ventricle were enlarged as we had difficulty manipulating the right heart catheter into the pulmonary artery.   RECOMMENDATION: Back to the outpatient area at home later today if no complications. She should not resume eloquence until 24 hours post procedure..  10/2014 Echo Study Conclusions  - Left ventricle: The cavity size was normal. Wall thickness was increased in a pattern of mild LVH. There was mild concentric hypertrophy. The estimated ejection fraction was 40%. Diffuse hypokinesis. - Mitral valve: Calcified annulus. There was mild regurgitation. - Left atrium: The atrium was severely dilated. - Right atrium: The atrium was severely dilated. - Atrial septum: Thinned and stretched but no obvious  PFO. No defect or patent foramen ovale was identified. - Tricuspid valve: There  was moderate regurgitation. - Impressions: E/E&' not reported and in afib diastolic evaluation not possible.  Impressions:  - E/E&' not reported and in afib diastolic evaluation not possible.   01/2015 RHC 1. Severely elevated right and left heart filling pressures.  2. Primarily pulmonary venous hypertension (may be additional component of PAH with PVR 4.6 WU).   Plan:  I am going to admit her for IV diuresis.      Right Heart Pressures RHC Procedural Findings: Hemodynamics (mmHg) RA mean 23 RV 68/14 PA 70/36, mean 52 PCWP mean 31  Oxygen saturations: PA 60% AO 98%  Cardiac Output (Fick) 4.54  Cardiac Index (Fick) 2.32  PVR 4.6 WU    Implants        Assessment and Plan   1. Chronic systolic/diastolic HF with mixed pulmonary HTN - recent admission after RHC showed mean PA 52 and PCWP 31. Diuresed, discharge weight 170 lbs, weight remains stable since discharge on lasix 60mg  bid - will repeat CMET and Mg. Continue current diuretic - defer consideration for pulmonary arterial vasodilator to CHF clinic  2. Afib - started on amio during recent admit, still in oral loading phase. Palpitations are improved - continue current meds. Eliquis remains expensive for her, will give samples today and apply for drug assistance.   Patient to f/u with CHF clinic in 2 weeks. Will space out our f/u, she will see me in 4 months     Antoine Poche, M.D.

## 2015-02-04 ENCOUNTER — Observation Stay (HOSPITAL_COMMUNITY)
Admission: EM | Admit: 2015-02-04 | Discharge: 2015-02-07 | Disposition: A | Discharge status: Home / Self Care | Payer: Medicare Other | Attending: Internal Medicine | Admitting: Internal Medicine

## 2015-02-04 ENCOUNTER — Encounter (HOSPITAL_COMMUNITY): Payer: Self-pay | Admitting: Emergency Medicine

## 2015-02-04 ENCOUNTER — Other Ambulatory Visit: Payer: Self-pay

## 2015-02-04 DIAGNOSIS — R251 Tremor, unspecified: Secondary | ICD-10-CM

## 2015-02-04 DIAGNOSIS — F5000 Anorexia nervosa, unspecified: Secondary | ICD-10-CM

## 2015-02-04 DIAGNOSIS — Z7901 Long term (current) use of anticoagulants: Secondary | ICD-10-CM | POA: Insufficient documentation

## 2015-02-04 DIAGNOSIS — K449 Diaphragmatic hernia without obstruction or gangrene: Secondary | ICD-10-CM | POA: Insufficient documentation

## 2015-02-04 DIAGNOSIS — K2270 Barrett's esophagus without dysplasia: Secondary | ICD-10-CM | POA: Insufficient documentation

## 2015-02-04 DIAGNOSIS — K219 Gastro-esophageal reflux disease without esophagitis: Secondary | ICD-10-CM | POA: Insufficient documentation

## 2015-02-04 DIAGNOSIS — R040 Epistaxis: Secondary | ICD-10-CM

## 2015-02-04 DIAGNOSIS — K529 Noninfective gastroenteritis and colitis, unspecified: Secondary | ICD-10-CM

## 2015-02-04 DIAGNOSIS — R079 Chest pain, unspecified: Secondary | ICD-10-CM

## 2015-02-04 DIAGNOSIS — R404 Transient alteration of awareness: Secondary | ICD-10-CM | POA: Diagnosis not present

## 2015-02-04 DIAGNOSIS — I4891 Unspecified atrial fibrillation: Secondary | ICD-10-CM

## 2015-02-04 DIAGNOSIS — R1030 Lower abdominal pain, unspecified: Secondary | ICD-10-CM

## 2015-02-04 DIAGNOSIS — R112 Nausea with vomiting, unspecified: Secondary | ICD-10-CM | POA: Insufficient documentation

## 2015-02-04 DIAGNOSIS — E039 Hypothyroidism, unspecified: Secondary | ICD-10-CM | POA: Diagnosis present

## 2015-02-04 DIAGNOSIS — K589 Irritable bowel syndrome without diarrhea: Secondary | ICD-10-CM | POA: Insufficient documentation

## 2015-02-04 DIAGNOSIS — M791 Myalgia: Secondary | ICD-10-CM | POA: Insufficient documentation

## 2015-02-04 DIAGNOSIS — R634 Abnormal weight loss: Secondary | ICD-10-CM

## 2015-02-04 DIAGNOSIS — D509 Iron deficiency anemia, unspecified: Secondary | ICD-10-CM

## 2015-02-04 DIAGNOSIS — R0602 Shortness of breath: Secondary | ICD-10-CM | POA: Diagnosis not present

## 2015-02-04 DIAGNOSIS — I5043 Acute on chronic combined systolic (congestive) and diastolic (congestive) heart failure: Secondary | ICD-10-CM

## 2015-02-04 DIAGNOSIS — I10 Essential (primary) hypertension: Secondary | ICD-10-CM | POA: Insufficient documentation

## 2015-02-04 DIAGNOSIS — G8929 Other chronic pain: Secondary | ICD-10-CM | POA: Insufficient documentation

## 2015-02-04 DIAGNOSIS — G4733 Obstructive sleep apnea (adult) (pediatric): Secondary | ICD-10-CM | POA: Insufficient documentation

## 2015-02-04 DIAGNOSIS — K3184 Gastroparesis: Secondary | ICD-10-CM | POA: Insufficient documentation

## 2015-02-04 DIAGNOSIS — R531 Weakness: Secondary | ICD-10-CM | POA: Diagnosis not present

## 2015-02-04 DIAGNOSIS — I951 Orthostatic hypotension: Secondary | ICD-10-CM

## 2015-02-04 DIAGNOSIS — Z87891 Personal history of nicotine dependence: Secondary | ICD-10-CM | POA: Insufficient documentation

## 2015-02-04 DIAGNOSIS — R55 Syncope and collapse: Principal | ICD-10-CM | POA: Insufficient documentation

## 2015-02-04 DIAGNOSIS — R1084 Generalized abdominal pain: Secondary | ICD-10-CM

## 2015-02-04 DIAGNOSIS — R072 Precordial pain: Secondary | ICD-10-CM

## 2015-02-04 DIAGNOSIS — N390 Urinary tract infection, site not specified: Secondary | ICD-10-CM | POA: Diagnosis present

## 2015-02-04 DIAGNOSIS — E860 Dehydration: Secondary | ICD-10-CM | POA: Insufficient documentation

## 2015-02-04 DIAGNOSIS — I480 Paroxysmal atrial fibrillation: Secondary | ICD-10-CM | POA: Insufficient documentation

## 2015-02-04 DIAGNOSIS — I509 Heart failure, unspecified: Secondary | ICD-10-CM

## 2015-02-04 DIAGNOSIS — E785 Hyperlipidemia, unspecified: Secondary | ICD-10-CM

## 2015-02-04 DIAGNOSIS — D649 Anemia, unspecified: Secondary | ICD-10-CM | POA: Insufficient documentation

## 2015-02-04 DIAGNOSIS — E43 Unspecified severe protein-calorie malnutrition: Secondary | ICD-10-CM

## 2015-02-04 DIAGNOSIS — R197 Diarrhea, unspecified: Secondary | ICD-10-CM

## 2015-02-04 DIAGNOSIS — R0600 Dyspnea, unspecified: Secondary | ICD-10-CM

## 2015-02-04 DIAGNOSIS — Z794 Long term (current) use of insulin: Secondary | ICD-10-CM | POA: Insufficient documentation

## 2015-02-04 DIAGNOSIS — Z5181 Encounter for therapeutic drug level monitoring: Secondary | ICD-10-CM

## 2015-02-04 DIAGNOSIS — W19XXXA Unspecified fall, initial encounter: Secondary | ICD-10-CM

## 2015-02-04 DIAGNOSIS — E669 Obesity, unspecified: Secondary | ICD-10-CM | POA: Insufficient documentation

## 2015-02-04 DIAGNOSIS — R188 Other ascites: Secondary | ICD-10-CM

## 2015-02-04 DIAGNOSIS — R569 Unspecified convulsions: Secondary | ICD-10-CM | POA: Diagnosis present

## 2015-02-04 DIAGNOSIS — E119 Type 2 diabetes mellitus without complications: Secondary | ICD-10-CM | POA: Insufficient documentation

## 2015-02-04 DIAGNOSIS — Z79899 Other long term (current) drug therapy: Secondary | ICD-10-CM | POA: Insufficient documentation

## 2015-02-04 MED ORDER — SODIUM CHLORIDE 0.9 % IV BOLUS (SEPSIS)
500.0000 mL | Freq: Once | INTRAVENOUS | Status: AC
  Administered 2015-02-05: 500 mL via INTRAVENOUS

## 2015-02-04 NOTE — ED Notes (Signed)
Dr Miller at bedside. 

## 2015-02-04 NOTE — ED Provider Notes (Signed)
CSN: [id number]     Arrival date & time 02/04/15  2316 History  This chart was scribed for Tara Hong, MD by Budd Palmer, ED Scribe. This patient was seen in room APA01/APA01 and the patient's care was started at 11:28 PM.    Chief Complaint  Patient presents with  . Fall   The history is provided by the patient and the EMS personnel. No language interpreter was used.   HPI Comments: JEWELIANA [name] is a 64 y.o. female with a PMHx of chronic Afib and pulmonary HTN, brought in by ambulance, who presents to the Emergency Department complaining of a fall that occurred PTA. Per EMS she fell in the shower. She comes from home. She has had a BM. She was unable to stand up by herself.   Pt states she was bending over washing her legs, then stood up and felt weak. The next things she remembers she was on the ground with her daughter. Her family helped her up and to the toilet, where she had a BM. She does not recall hitting her head. She reports associated weakness, leg pain, nausea, vomiting (1 day ago), and loss of appetite. She has eaten very little today. She saw her cardiologist on 8/1 when she was admitted for acute on chronic CHF. She had a R sided heart catheterization. They noted improved coronary pressures, but did not perform a coronary evaluation.She states they were unable to complete it because she was hyperventilating. She was told there were no blockages. She stayed for 4 days to be diuresed. She was discharged with lasix to be taken 2x daily. She has been on Zofran for the past 2 years for nausea. She has been to see a specialist and was diagnosed with IBS. She is on lorazepam. She quit smoking about 30 years ago. Pt denies urinary retention and abdominal pain  Past Medical History  Diagnosis Date  . Chest pain     Negative cardiac catheterization in 2002; negative stress nuclear study in 2008  . Congestive heart failure     Normal LV systolic function  . PAF (paroxysmal atrial  fibrillation) 2009  . Chronic anticoagulation   . Diabetes mellitus, type 2     Insulin therapy; exacerbated by prednisone  . Syncope     Admitted 05/2009; magnetic resonance imagin/ MRA - negative; etiology thought to be orthostasis secondary to drugs and dehydration  . GERD (gastroesophageal reflux disease)   . IBS (irritable bowel syndrome)   . Hiatal hernia   . Gastroparesis     99% retention 05/2008 on GES  . Hyperlipidemia   . Hypertension   . Hypothyroid   . Chronic LBP     Surgical intervention in 1996  . Obesity   . OSA on CPAP   . Anemia     H/H of 10/30 with a normal MCV in 12/09  . Barrett's esophagus     Diagnosed 1995. Last EGD 2016-NO BARRETT'S.   . Seizures   . Pulmonary hypertension 01/2015  . Shortness of breath dyspnea    Past Surgical History  Procedure Laterality Date  . Cardiovascular stress test  2008    Stress nuclear study  . Back surgery  1996  . Laparoscopic cholecystectomy  1990s  . Total abdominal hysterectomy  1999  . Laminectomy  1995    L4-L5  . Carpal tunnel release  1994  . Esophagogastroduodenoscopy  2008    Barrett's without dysplasia. esphagus dilated. antral erosions, h.pylori serologies negative.  Marland Kitchen  Colonoscopy  11/2011    Dr. Darrick Penna: Internal hemorrhoids, mild diverticulosis. Random colon biopsies negative.  . Givens capsule study  12/07/2011    Proximal small bowel, rare AVM. Distal small bowel, multiple ulcers noted  . Esophagogastroduodenoscopy  11/2011    Dr. Darrick Penna: Barrett's esophagus, mild gastritis, diverticulum in the second portion of the duodenum repeat EGD 3 years. Small bowel biopsies negative. Gastric biopsy show reactive gastropathy but no H. pylori. Esophageal biopsies consistent with GERD. Next EGD 11/2014  . Givens capsule study N/A 09/27/2013    Distal small bowel ulcers extending to TI.  Marland Kitchen Givens capsule study N/A 10/10/2013    Procedure: GIVENS CAPSULE STUDY;  Surgeon: West Bali, MD;  Location: [facility];   Service: Endoscopy;  Laterality: N/A;  7:30  . Colonoscopy N/A 11/08/2013    SLF: Normal mucosa in the terminal ileum/The colon IS redundant/  Moderate diverticulosis throughout the entire colon. ileum bx benign. colon bx benign  . Esophageal biopsy N/A 11/08/2013    Procedure: BIOPSY  / Tissue sampling / ulcers present in small intestine;  Surgeon: West Bali, MD;  Location: [facility];  Service: Endoscopy;  Laterality: N/A;  . Cardiac catheterization  2002  . Right heart catheterization N/A 01/10/2014    Procedure: RIGHT HEART CATH;  Surgeon: Lesleigh Noe, MD;  Location: [facility] CATH LAB;  Service: Cardiovascular;  Laterality: N/A;  . Left heart catheterization with coronary angiogram  01/10/2014    Procedure: LEFT HEART CATHETERIZATION WITH CORONARY ANGIOGRAM;  Surgeon: Lesleigh Noe, MD;  Location: [facility] CATH LAB;  Service: Cardiovascular;;  . Esophagogastroduodenoscopy N/A 11/21/2014    Procedure: ESOPHAGOGASTRODUODENOSCOPY (EGD);  Surgeon: West Bali, MD;  Location: [facility];  Service: Endoscopy;  Laterality: N/A;  12:00 - moved to 12:45 - office to notify  . Cardiac catheterization N/A 01/26/2015    Procedure: Right Heart Cath;  Surgeon: Laurey Morale, MD;  Location: [facility] INVASIVE CV LAB;  Service: Cardiovascular;  Laterality: N/A;   Family History  Problem Relation Age of Onset  . Hypertension Mother   . Alzheimer's disease Mother   . Stroke Mother   . Heart attack Mother   . Heart disease Neg Hx   . Hypertension Other   . Colon cancer Neg Hx    History  Substance Use Topics  . Smoking status: Former Smoker -- 15 years    Start date: 02/26/1966    Quit date: 07/01/1983  . Smokeless tobacco: Never Used     Comment: quit in 1984  . Alcohol Use: No     Comment: last etoh one year ago, never frequent   OB History    Gravida Para Term Preterm AB TAB SAB Ectopic Multiple Living   Review of Systems  Constitutional: Positive for appetite change.   Gastrointestinal: Positive for nausea and vomiting.  Musculoskeletal: Positive for myalgias.  Neurological: Positive for weakness.  All other systems reviewed and are negative.  Allergies  Ibuprofen; Celexa; Codeine; and Reglan  Home Medications   Prior to Admission medications   Medication Sig Start Date End Date Taking? Authorizing Provider  amiodarone (PACERONE) 200 MG tablet Take 1 tablet (200 mg total) by mouth 2 (two) times daily. 01/29/15   Graciella Freer, PA-C  carvedilol (COREG) 3.125 MG tablet Take 3 tablets (9.375 mg total) by mouth 2 (two) times daily with a meal. 01/29/15  Mariam Dollar Tillery, PA-C  dicyclomine (BENTYL) 10 MG capsule 1 po 30 mins before meals up to three times a day or q4h prn for abdominal pain/diarrhea. NO MORE 8 DOSES IN A DAY. Patient taking differently: Take 10 mg by mouth daily as needed. 1 po 30 mins before meals up to three times a day or q4h prn for abdominal pain/diarrhea. NO MORE 8 DOSES IN A DAY. 11/21/14   West Bali, MD  dronabinol (MARINOL) 2.5 MG capsule Take 1 capsule (2.5 mg total) by mouth 2 (two) times daily before a meal. 06/04/14   West Bali, MD  DULoxetine (CYMBALTA) 30 MG capsule Take 30 mg by mouth 2 (two) times daily.    Historical Provider, MD  ELIQUIS 5 MG TABS tablet TAKE 1 TABLET BY MOUTH TWICE DAILY. 07/24/14   Antoine Poche, MD  furosemide (LASIX) 20 MG tablet Take 3 tablets (60 mg total) by mouth 2 (two) times daily. 01/29/15   Graciella Freer, PA-C  glipiZIDE (GLUCOTROL) 10 MG tablet Take 10 mg by mouth daily before breakfast.     Historical Provider, MD  Insulin Glargine (LANTUS SOLOSTAR) 100 UNIT/ML Solostar Pen Inject 20-50 Units into the skin at bedtime as needed. Patient takes 20 units every night at bedtime if over 178.  Takes 50 units if her blood sugar is above 300.    Historical Provider, MD  levETIRAcetam (KEPPRA) 250 MG tablet Take 1 tablet (250 mg total) by mouth 2 (two) times daily. 11/10/12    Gareth Morgan, MD  levothyroxine (SYNTHROID, LEVOTHROID) 137 MCG tablet Take 137 mcg by mouth daily. 10/14/14   Historical Provider, MD  lisinopril (PRINIVIL,ZESTRIL) 5 MG tablet Take 1 tablet (5 mg total) by mouth daily. 01/29/15   Graciella Freer, PA-C  loperamide (IMODIUM) 2 MG capsule TAKE (1) CAPSULE THREE TIMES DAILY AS NEEDED FOR DIARRHEA OR LOOSE STOOLS. 01/20/15   Nira Retort, NP  LORazepam (ATIVAN) 1 MG tablet Take 1-2 mg by mouth daily as needed for anxiety or sleep. For anxiety    Historical Provider, MD  magnesium oxide (MAG-OX) 400 MG tablet Take 1 tablet (400 mg total) by mouth daily. 10/06/14   Antoine Poche, MD  metaxalone (SKELAXIN) 800 MG tablet Take 800 mg by mouth 2 (two) times daily.  10/04/14   Historical Provider, MD  metFORMIN (GLUCOPHAGE) 500 MG tablet Take 1,000 mg by mouth 2 (two) times daily with a meal.     Historical Provider, MD  Multiple Vitamin (MULTIVITAMIN WITH MINERALS) TABS tablet Take 1 tablet by mouth daily.    Historical Provider, MD  nitroGLYCERIN (NITROSTAT) 0.4 MG SL tablet Place 0.4 mg under the tongue every 5 (five) minutes as needed for chest pain. For chest pain     Historical Provider, MD  omeprazole (PRILOSEC) 20 MG capsule Take 20 mg by mouth 2 (two) times daily.      Historical Provider, MD  ondansetron (ZOFRAN) 4 MG tablet Take 4 mg by mouth every 6 (six) hours as needed. For nausea 03/15/14   Historical Provider, MD  POLY-IRON 150 150 MG capsule TAKE 1 CAPSULE BY MOUTH 2 TIMES DAILY. 01/19/15   Ellouise Newer, PA-C  potassium chloride SA (K-DUR,KLOR-CON) 20 MEQ tablet Take 2 tablets (40 mEq total) by mouth once. 01/29/15   Graciella Freer, PA-C  vitamin B-12 (CYANOCOBALAMIN) 500 MCG tablet Take 500 mcg by mouth daily.    Historical Provider, MD   BP 87/67 mmHg  Pulse 77  Temp(Src) 97.6 F (36.4 C) (Oral)  Resp 20  SpO2 98% Physical Exam  Constitutional: She appears well-developed and well-nourished. No distress.  HENT:  Head:  Normocephalic and atraumatic.  Mouth/Throat: Oropharynx is clear and moist. No oropharyngeal exudate.  Eyes: Conjunctivae and EOM are normal. Pupils are equal, round, and reactive to light. Right eye exhibits no discharge. Left eye exhibits no discharge. No scleral icterus.  Neck: Normal range of motion. Neck supple. No JVD present. No thyromegaly present.  Cardiovascular: Normal rate and intact distal pulses.  Exam reveals no gallop and no friction rub.   No murmur heard. Irregular rhythm. Normal pulses of the feet.  Pulmonary/Chest: Effort normal and breath sounds normal. No respiratory distress. She has no wheezes. She has no rales.  Abdominal: Soft. Bowel sounds are normal. She exhibits no distension and no mass. There is tenderness.  Mild, diffuse abdominal tenderness.  Musculoskeletal: Normal range of motion. She exhibits no edema or tenderness.  Asymmetry of the lower extremities R greater than L below the knee. No pitting edema.  Lymphadenopathy:    She has no cervical adenopathy.  Neurological: She is alert. Coordination normal.  Neurologic exam:  Speech clear, pupils equal round reactive to light, extraocular movements intact  Normal peripheral visual fields Cranial nerves III through XII normal including no facial droop Follows commands, moves all extremities x4, normal strength to bilateral upper and lower extremities at all major muscle groups including grip Sensation normal to light touch and pinprick Coordination intact, no limb ataxia, finger-nose-finger normal Rapid alternating movements normal No pronator drift   Skin: Skin is warm and dry. No rash noted. No erythema.  Psychiatric: She has a normal mood and affect. Her behavior is normal.  Nursing note and vitals reviewed.   ED Course  Procedures  DIAGNOSTIC STUDIES: Oxygen Saturation is 97% on RA, normal by my interpretation.    COORDINATION OF CARE: 11:32 PM - Discussed plans to wait on diagnostic studies. Pt  advised of plan for treatment and pt agrees.  Labs Review Labs Reviewed  COMPREHENSIVE METABOLIC PANEL - Abnormal; Notable for the following:    Chloride 97 (*)    Glucose, Bld 206 (*)    BUN 44 (*)    Creatinine, Ser 1.26 (*)    GFR calc non Af Amer 44 (*)    GFR calc Af Amer 51 (*)    All other components within normal limits  CBC WITH DIFFERENTIAL/PLATELET - Abnormal; Notable for the following:    Hemoglobin 11.6 (*)    MCH 25.3 (*)    RDW 20.4 (*)    All other components within normal limits  TROPONIN I - Abnormal; Notable for the following:    Troponin I 0.04 (*)    All other components within normal limits  CBG MONITORING, ED - Abnormal; Notable for the following:    Glucose-Capillary 182 (*)    All other components within normal limits  URINALYSIS, ROUTINE W REFLEX MICROSCOPIC (NOT AT [facility])    Imaging Review Dg Chest Port 1 View  02/05/2015   CLINICAL DATA:  Dyspnea this morning. Chronic atrial fibrillation and pulmonary hypertension  EXAM: PORTABLE CHEST - 1 VIEW  COMPARISON:  07/20/2014  FINDINGS: There is marked cardiomegaly, unchanged. There is a linear scarring in the left base, unchanged. There is mild vascular congestion and minimal perihilar ground-glass opacity. There is no large effusion  IMPRESSION: Marked cardiomegaly. Mild congestive changes in the central vasculature.   Electronically Signed   By: Reuel Boom  Royce Macadamia M.D.   On: 02/05/2015 01:34     EKG Interpretation   Date/Time:  Wednesday February 04 2015 23:48:12 EDT Ventricular Rate:  68 PR Interval:    QRS Duration: 112 QT Interval:  467 QTC Calculation: 497 R Axis:   -49 Text Interpretation:  Atrial fibrillation Left anterior fascicular block  Abnormal R-wave progression, late transition Nonspecific T abnormalities,  anterior leads Borderline prolonged QT interval Since last tracing rate  slower Confirmed by Judit Awad  MD, Nikyla Navedo ([id number]) on 02/05/2015 12:36:41 AM      MDM   Final diagnoses:  Syncope  and collapse  Dehydration  SOB (shortness of breath)   Ongoing generalized weakness, Hypotension is responding to IVF Labs show mild AKI with elevated BUN D/w hospitalist who will see pt in ED. CXR ordered.   The pt has signfiicant cardiomegaly as well as signs of mild vascular congestion on xray  I have personally viewed and interpreted the imaging and agree with radiologist interpretation.  Pt to be admitted by Dr. Sharl Ma.  Meds given in ED:  Medications  sodium chloride 0.9 % bolus 500 mL (0 mLs Intravenous Stopped 02/05/15 0042)    I personally performed the services described in this documentation, which was scribed in my presence. The recorded information has been reviewed and considered.   Tara Hong, MD 02/05/15 [phone]

## 2015-02-04 NOTE — ED Notes (Signed)
Per EMS pt. Fell while in the shower. Pt. Was assisted to toiled by daughter and then had diarrhea and fell again. Pt. Denies nausea at present. Pt. C/o pain to lower legs. Pt. Denies LOC.

## 2015-02-05 ENCOUNTER — Inpatient Hospital Stay (HOSPITAL_COMMUNITY): Payer: Medicare Other

## 2015-02-05 ENCOUNTER — Encounter (HOSPITAL_COMMUNITY): Payer: Self-pay | Admitting: *Deleted

## 2015-02-05 ENCOUNTER — Observation Stay (HOSPITAL_COMMUNITY): Payer: Medicare Other

## 2015-02-05 DIAGNOSIS — J984 Other disorders of lung: Secondary | ICD-10-CM | POA: Diagnosis not present

## 2015-02-05 DIAGNOSIS — I5032 Chronic diastolic (congestive) heart failure: Secondary | ICD-10-CM

## 2015-02-05 DIAGNOSIS — I10 Essential (primary) hypertension: Secondary | ICD-10-CM | POA: Diagnosis not present

## 2015-02-05 DIAGNOSIS — M7989 Other specified soft tissue disorders: Secondary | ICD-10-CM | POA: Diagnosis not present

## 2015-02-05 DIAGNOSIS — M25571 Pain in right ankle and joints of right foot: Secondary | ICD-10-CM | POA: Diagnosis not present

## 2015-02-05 DIAGNOSIS — E039 Hypothyroidism, unspecified: Secondary | ICD-10-CM

## 2015-02-05 DIAGNOSIS — Z7901 Long term (current) use of anticoagulants: Secondary | ICD-10-CM | POA: Diagnosis not present

## 2015-02-05 DIAGNOSIS — E860 Dehydration: Secondary | ICD-10-CM | POA: Diagnosis present

## 2015-02-05 DIAGNOSIS — R55 Syncope and collapse: Secondary | ICD-10-CM | POA: Diagnosis present

## 2015-02-05 DIAGNOSIS — I517 Cardiomegaly: Secondary | ICD-10-CM | POA: Diagnosis not present

## 2015-02-05 DIAGNOSIS — S99911A Unspecified injury of right ankle, initial encounter: Secondary | ICD-10-CM | POA: Diagnosis not present

## 2015-02-05 LAB — URINE MICROSCOPIC-ADD ON

## 2015-02-05 LAB — COMPREHENSIVE METABOLIC PANEL
ALBUMIN: 3.9 g/dL (ref 3.5–5.0)
ALT: 15 U/L (ref 14–54)
ALT: 16 U/L (ref 14–54)
AST: 23 U/L (ref 15–41)
AST: 24 U/L (ref 15–41)
Albumin: 4 g/dL (ref 3.5–5.0)
Alkaline Phosphatase: 65 U/L (ref 38–126)
Alkaline Phosphatase: 69 U/L (ref 38–126)
Anion gap: 13 (ref 5–15)
Anion gap: 9 (ref 5–15)
BILIRUBIN TOTAL: 0.9 mg/dL (ref 0.3–1.2)
BUN: 44 mg/dL — ABNORMAL HIGH (ref 6–20)
BUN: 46 mg/dL — ABNORMAL HIGH (ref 6–20)
CALCIUM: 8.8 mg/dL — AB (ref 8.9–10.3)
CALCIUM: 9.3 mg/dL (ref 8.9–10.3)
CHLORIDE: 97 mmol/L — AB (ref 101–111)
CO2: 26 mmol/L (ref 22–32)
CO2: 27 mmol/L (ref 22–32)
Chloride: 99 mmol/L — ABNORMAL LOW (ref 101–111)
Creatinine, Ser: 1.2 mg/dL — ABNORMAL HIGH (ref 0.44–1.00)
Creatinine, Ser: 1.26 mg/dL — ABNORMAL HIGH (ref 0.44–1.00)
GFR calc non Af Amer: 44 mL/min — ABNORMAL LOW (ref >60)
GFR calc non Af Amer: 46 mL/min — ABNORMAL LOW (ref >60)
GFR, EST AFRICAN AMERICAN: 51 mL/min — AB (ref >60)
GFR, EST AFRICAN AMERICAN: 54 mL/min — AB (ref >60)
GLUCOSE: 206 mg/dL — AB (ref 65–99)
Glucose, Bld: 201 mg/dL — ABNORMAL HIGH (ref 65–99)
Potassium: 3.6 mmol/L (ref 3.5–5.1)
Potassium: 4 mmol/L (ref 3.5–5.1)
Sodium: 134 mmol/L — ABNORMAL LOW (ref 135–145)
Sodium: 137 mmol/L (ref 135–145)
Total Bilirubin: 0.8 mg/dL (ref 0.3–1.2)
Total Protein: 7 g/dL (ref 6.5–8.1)
Total Protein: 7.7 g/dL (ref 6.5–8.1)

## 2015-02-05 LAB — URINALYSIS, ROUTINE W REFLEX MICROSCOPIC
BILIRUBIN URINE: NEGATIVE
Glucose, UA: NEGATIVE mg/dL
NITRITE: NEGATIVE
Protein, ur: NEGATIVE mg/dL
Specific Gravity, Urine: 1.015 (ref 1.005–1.030)
Urobilinogen, UA: 0.2 mg/dL (ref 0.0–1.0)
pH: 6 (ref 5.0–8.0)

## 2015-02-05 LAB — CBC
HEMATOCRIT: 35.4 % — AB (ref 36.0–46.0)
Hemoglobin: 10.7 g/dL — ABNORMAL LOW (ref 12.0–15.0)
MCH: 25.3 pg — ABNORMAL LOW (ref 26.0–34.0)
MCHC: 30.2 g/dL (ref 30.0–36.0)
MCV: 83.7 fL (ref 78.0–100.0)
PLATELETS: 159 10*3/uL (ref 150–400)
RBC: 4.23 MIL/uL (ref 3.87–5.11)
RDW: 20.4 % — AB (ref 11.5–15.5)
WBC: 11.7 10*3/uL — AB (ref 4.0–10.5)

## 2015-02-05 LAB — CBC WITH DIFFERENTIAL/PLATELET
Basophils Absolute: 0 10*3/uL (ref 0.0–0.1)
Basophils Relative: 1 % (ref 0–1)
EOS PCT: 1 % (ref 0–5)
Eosinophils Absolute: 0.1 10*3/uL (ref 0.0–0.7)
HEMATOCRIT: 38.3 % (ref 36.0–46.0)
Hemoglobin: 11.6 g/dL — ABNORMAL LOW (ref 12.0–15.0)
LYMPHS PCT: 26 % (ref 12–46)
Lymphs Abs: 2.2 10*3/uL (ref 0.7–4.0)
MCH: 25.3 pg — ABNORMAL LOW (ref 26.0–34.0)
MCHC: 30.3 g/dL (ref 30.0–36.0)
MCV: 83.4 fL (ref 78.0–100.0)
MONO ABS: 0.8 10*3/uL (ref 0.1–1.0)
MONOS PCT: 10 % (ref 3–12)
NEUTROS ABS: 5.1 10*3/uL (ref 1.7–7.7)
Neutrophils Relative %: 62 % (ref 43–77)
PLATELETS: 162 10*3/uL (ref 150–400)
RBC: 4.59 MIL/uL (ref 3.87–5.11)
RDW: 20.4 % — ABNORMAL HIGH (ref 11.5–15.5)
SMEAR REVIEW: ADEQUATE
WBC: 8.2 10*3/uL (ref 4.0–10.5)

## 2015-02-05 LAB — GLUCOSE, CAPILLARY
GLUCOSE-CAPILLARY: 189 mg/dL — AB (ref 65–99)
Glucose-Capillary: 198 mg/dL — ABNORMAL HIGH (ref 65–99)
Glucose-Capillary: 130 mg/dL — ABNORMAL HIGH (ref 65–99)
Glucose-Capillary: 109 mg/dL — ABNORMAL HIGH (ref 65–99)
Glucose-Capillary: 150 mg/dL — ABNORMAL HIGH (ref 65–99)

## 2015-02-05 LAB — TROPONIN I
TROPONIN I: 0.03 ng/mL (ref <0.031)
TROPONIN I: 0.04 ng/mL — AB (ref <0.031)
TROPONIN I: 0.04 ng/mL — AB (ref <0.031)
Troponin I: 0.03 ng/mL (ref <0.031)

## 2015-02-05 LAB — CBG MONITORING, ED: Glucose-Capillary: 182 mg/dL — ABNORMAL HIGH (ref 65–99)

## 2015-02-05 MED ORDER — LEVETIRACETAM 250 MG PO TABS
250.0000 mg | ORAL_TABLET | Freq: Two times a day (BID) | ORAL | Status: DC
  Administered 2015-02-05 – 2015-02-07 (×5): 250 mg via ORAL
  Filled 2015-02-05 (×5): qty 1

## 2015-02-05 MED ORDER — INSULIN GLARGINE 100 UNIT/ML ~~LOC~~ SOLN
20.0000 [IU] | Freq: Every day | SUBCUTANEOUS | Status: DC
  Administered 2015-02-05 – 2015-02-06 (×3): 20 [IU] via SUBCUTANEOUS
  Filled 2015-02-05 (×5): qty 0.2

## 2015-02-05 MED ORDER — ACETAMINOPHEN 650 MG RE SUPP: 650.0000 mg | Freq: Four times a day (QID) | RECTAL | Status: DC | PRN

## 2015-02-05 MED ORDER — ONDANSETRON HCL 4 MG PO TABS
4.0000 mg | ORAL_TABLET | Freq: Four times a day (QID) | ORAL | Status: DC | PRN
  Administered 2015-02-06: 4 mg via ORAL
  Filled 2015-02-05: qty 1

## 2015-02-05 MED ORDER — LORAZEPAM 1 MG PO TABS
1.0000 mg | ORAL_TABLET | Freq: Every day | ORAL | Status: DC | PRN
  Administered 2015-02-05 – 2015-02-06 (×2): 1 mg via ORAL
  Filled 2015-02-05 (×2): qty 1

## 2015-02-05 MED ORDER — APIXABAN 5 MG PO TABS
5.0000 mg | ORAL_TABLET | Freq: Two times a day (BID) | ORAL | Status: DC
  Administered 2015-02-05 – 2015-02-07 (×5): 5 mg via ORAL
  Filled 2015-02-05 (×5): qty 1

## 2015-02-05 MED ORDER — DULOXETINE HCL 30 MG PO CPEP
30.0000 mg | ORAL_CAPSULE | Freq: Two times a day (BID) | ORAL | Status: DC
  Administered 2015-02-05 – 2015-02-07 (×5): 30 mg via ORAL
  Filled 2015-02-05 (×5): qty 1

## 2015-02-05 MED ORDER — LEVOTHYROXINE SODIUM 25 MCG PO TABS
137.0000 ug | ORAL_TABLET | Freq: Every day | ORAL | Status: DC
  Administered 2015-02-05 – 2015-02-06 (×2): 137 ug via ORAL
  Filled 2015-02-05 (×4): qty 1

## 2015-02-05 MED ORDER — DRONABINOL 2.5 MG PO CAPS
2.5000 mg | ORAL_CAPSULE | Freq: Two times a day (BID) | ORAL | Status: DC
  Administered 2015-02-05 – 2015-02-07 (×4): 2.5 mg via ORAL
  Filled 2015-02-05 (×4): qty 1

## 2015-02-05 MED ORDER — ACETAMINOPHEN 325 MG PO TABS
650.0000 mg | ORAL_TABLET | Freq: Four times a day (QID) | ORAL | Status: DC | PRN
  Administered 2015-02-05: 650 mg via ORAL
  Filled 2015-02-05: qty 2

## 2015-02-05 MED ORDER — CETYLPYRIDINIUM CHLORIDE 0.05 % MT LIQD
7.0000 mL | Freq: Two times a day (BID) | OROMUCOSAL | Status: DC
  Administered 2015-02-05 – 2015-02-06 (×5): 7 mL via OROMUCOSAL

## 2015-02-05 MED ORDER — PANTOPRAZOLE SODIUM 40 MG PO TBEC
40.0000 mg | DELAYED_RELEASE_TABLET | Freq: Every day | ORAL | Status: DC
Start: 2015-02-05 — End: 2015-02-07
  Administered 2015-02-05 – 2015-02-07 (×3): 40 mg via ORAL
  Filled 2015-02-05 (×3): qty 1

## 2015-02-05 MED ORDER — SODIUM CHLORIDE 0.9 % IV BOLUS (SEPSIS)
500.0000 mL | Freq: Once | INTRAVENOUS | Status: AC
  Administered 2015-02-05: 500 mL via INTRAVENOUS

## 2015-02-05 MED ORDER — INSULIN ASPART 100 UNIT/ML ~~LOC~~ SOLN
0.0000 [IU] | Freq: Three times a day (TID) | SUBCUTANEOUS | Status: DC
  Administered 2015-02-05: 2 [IU] via SUBCUTANEOUS
  Administered 2015-02-05 – 2015-02-07 (×4): 1 [IU] via SUBCUTANEOUS

## 2015-02-05 MED ORDER — ONDANSETRON HCL 4 MG/2ML IJ SOLN
4.0000 mg | Freq: Three times a day (TID) | INTRAMUSCULAR | Status: AC | PRN
  Administered 2015-02-05: 4 mg via INTRAVENOUS

## 2015-02-05 MED ORDER — ONDANSETRON HCL 4 MG/2ML IJ SOLN
4.0000 mg | Freq: Four times a day (QID) | INTRAMUSCULAR | Status: DC | PRN
  Filled 2015-02-05: qty 2

## 2015-02-05 MED ORDER — AMIODARONE HCL 200 MG PO TABS
200.0000 mg | ORAL_TABLET | Freq: Two times a day (BID) | ORAL | Status: DC
  Administered 2015-02-05 – 2015-02-07 (×5): 200 mg via ORAL
  Filled 2015-02-05 (×5): qty 1

## 2015-02-05 MED ORDER — DICYCLOMINE HCL 10 MG PO CAPS: 10.0000 mg | ORAL_CAPSULE | Freq: Every day | ORAL | Status: DC | PRN

## 2015-02-05 NOTE — Progress Notes (Signed)
[facility] PROGRESS NOTE  [name] YNW:[id number] DOB: 1950-09-21 DOA: 02/04/2015 PCP: [name], MD  Assessment/Plan: 1. Syncope -Likely secondary to hypovolemia/dehydration after recent hospitalization for acute CHF at which time she was treated with IV Lasix. Patient was discharged on Lasix 60 mg by mouth twice a day. -She was given gentle IV fluid hydration overnight, reports feeling poorly this morning. Blood pressures remained low at 95/52. Will give 500 mL bolus of normal saline. -Recheck orthostatics today -Continue holding Lasix while close monitoring of volume status  2.  Chronic atrial fibrillation -She remains anticoagulated with Eliquis -Continue amiodarone 200 mg by mouth twice a day -Remains rate controlled.  3.  Insulin dependent diabetes mellitus -Blood sugars remain in the 100s range -Continue Lantus 20 units subcutaneous daily at bedtime  4.  Chronic diastolic congestive heart failure -Patient recently admitted for acute CHF undergoing IV diuresis. -Last transthoracic echocardiogram performed 01/27/2015 that showed preserved EF of 55-60%, having tolerated hypertension, severely dilated left and right atria -As needed above holding a diabetic therapy as I suspect she is volume depleted  5.  Right ankle pain -Patient having a syncopal event about from overnight reporting significant pain with passive and active movement over her right ankle. -Will check a two-view x-ray  Code Status: Full code Family Communication:  Disposition Plan:     HPI/Subjective: Patient is a pleasant 64 year old female with past medical history of congestive heart failure and pulmonary hypertension having hospitalization for acute CHF, diuresed with IV Lasix. She was discharged on Lasix 60 mg by mouth twice a day. She presents with complaints of generalized weakness, having a syncopal event or I in the bathroom. On presentation she was found to be hypotensive having  blood pressures of 87/67 and 95/52. Symptoms felt to be secondary to dehydration as direct were discontinued and she was given gentle IV fluid hydration.  Objective: Filed Vitals:   02/05/15 0415  BP: 95/52  Pulse: 85  Temp: 97.8 F (36.6 C)  Resp: 20   No intake or output data in the 24 hours ending 02/05/15 0820 Filed Weights   02/05/15 0415  Weight: 74.7 kg (164 lb 10.9 oz)    Exam:   General:  Patient reporting feeling ill this morning, "not myself" otherwise awake and alert following commands   Cardiovascular: regular rate rhythm normal S1-S2   Respiratory: lungs are clear to auscultation bilaterally   Abdomen: soft nontender nondistended   Musculoskeletal:  she has pain with passive and active movement over her right ankle region   Data Reviewed: Basic Metabolic Panel:  Recent Labs Lab 01/29/15 1140 02/04/15 2341 02/05/15 0321  NA 138 137 134*  K 3.7 4.0 3.6  CL 99* 97* 99*  CO2 32 27 26  GLUCOSE 195* 206* 201*  BUN 18 44* 46*  CREATININE 0.83 1.26* 1.20*  CALCIUM 9.4 9.3 8.8*  MG 1.9  --   --    Liver Function Tests:  Recent Labs Lab 02/04/15 2341 02/05/15 0321  AST 24 23  ALT 16 15  ALKPHOS 69 65  BILITOT 0.8 0.9  PROT 7.7 7.0  ALBUMIN 4.0 3.9   No results for input(s): LIPASE, AMYLASE in the last 168 hours. No results for input(s): AMMONIA in the last 168 hours. CBC:  Recent Labs Lab 02/04/15 2341 02/05/15 0321  WBC 8.2 11.7*  NEUTROABS 5.1  --   HGB 11.6* 10.7*  HCT 38.3 35.4*  MCV 83.4 83.7  PLT 162 159   Cardiac Enzymes:  Recent Labs Lab 02/04/15 2341 02/05/15 0321  TROPONINI 0.04* 0.04*   BNP (last 3 results)  Recent Labs  07/20/14 1702 01/26/15 1149 01/29/15 0410  BNP 576.0* 566.0* 265.7*    ProBNP (last 3 results)  Recent Labs  04/04/14 2327  PROBNP 1689.0*    CBG:  Recent Labs Lab 02/05/15 02/05/15 0417 02/05/15 0729  GLUCAP 182* 198* 189*    Recent Results (from the past 240 hour(s))  MRSA  PCR Screening     Status: None   Collection Time: 01/26/15  6:08 PM  Result Value Ref Range Status   MRSA by PCR NEGATIVE NEGATIVE Final    Comment:        The GeneXpert MRSA Assay (FDA approved for NASAL specimens only), is one component of a comprehensive MRSA colonization surveillance program. It is not intended to diagnose MRSA infection nor to guide or monitor treatment for MRSA infections.      Studies: Dg Chest Port 1 View  02/05/2015   CLINICAL DATA:  Dyspnea this morning. Chronic atrial fibrillation and pulmonary hypertension  EXAM: PORTABLE CHEST - 1 VIEW  COMPARISON:  07/20/2014  FINDINGS: There is marked cardiomegaly, unchanged. There is a linear scarring in the left base, unchanged. There is mild vascular congestion and minimal perihilar ground-glass opacity. There is no large effusion  IMPRESSION: Marked cardiomegaly. Mild congestive changes in the central vasculature.   Electronically Signed   By: Ellery Plunk M.D.   On: 02/05/2015 01:34    Scheduled Meds: . amiodarone  200 mg Oral BID  . antiseptic oral rinse  7 mL Mouth Rinse BID  . apixaban  5 mg Oral BID  . dronabinol  2.5 mg Oral BID AC  . DULoxetine  30 mg Oral BID  . insulin aspart  0-9 Units Subcutaneous TID WC  . insulin glargine  20 Units Subcutaneous Q2200  . levETIRAcetam  250 mg Oral BID  . levothyroxine  137 mcg Oral QAC breakfast  . pantoprazole  40 mg Oral Daily  . sodium chloride  500 mL Intravenous Once   Continuous Infusions:   Active Problems:   Hypothyroidism   Essential hypertension   Congestive heart failure   Chronic anticoagulation   Syncope   DM type 2 (diabetes mellitus, type 2)   Syncope and collapse    Time spent:     Jeralyn Bennett  [facility] Pager [phone]. If 7PM-7AM, please contact night-coverage at [email or web address], password [facility] 02/05/2015, 8:20 AM  LOS: 0 days

## 2015-02-05 NOTE — Care Management Note (Signed)
Case Management Note  Patient Details  Name: FREDRICK MCBRYDE MRN: [id number] Date of Birth: 02/21/1951  Subjective/Objective:                  Pt admitted from home with syncope and collapse. Pt lives with her husband and daughter and pt will return home at discharge. Pt has a walker and BSC for home use.   Action/Plan: PT is recommending HH PT at discharge. Will arrange with agency of choice prior to discharge.   Expected Discharge Date:  02/09/15               Expected Discharge Plan:  Home w Home Health Services  In-House Referral:  NA  Discharge planning Services  CM Consult  Post Acute Care Choice:  Home Health Choice offered to:  Patient  DME Arranged:    DME Agency:     HH Arranged:  PT HH Agency:     Status of Service:  In process, will continue to follow  Medicare Important Message Given:    Date Medicare IM Given:    Medicare IM give by:    Date Additional Medicare IM Given:    Additional Medicare Important Message give by:     If discussed at Long Length of Stay Meetings, dates discussed:    Additional Comments:  Cheryl Flash, RN 02/05/2015, 3:03 PM

## 2015-02-05 NOTE — Evaluation (Signed)
Physical Therapy Evaluation Patient Details Name: [name] MRN: [id number] DOB: 02-Jun-1951 Today's Date: 02/05/2015   History of Present Illness  Patient recently was discharged from the allergy service after she was treated for congestive heart failure. Patient has history of pulmonary hypertension. She was discharged on high-dose Lasix 60 mg twice a day.  She lives with her family where someone is normally with her.  She ambulates with no assistive device but has used a walker in the past.  Clinical Impression  Pt was seen for evaluation.  She was alert and oriented, very cooperative.  Her supine BP was quite low (79/46) but with sitting it improved to 102/56.  She had no symptoms of orthostasis with gait or at any time during my visit.  Her right ankle is mildly edematous and uncomfortable with DF/PF but she is able to move it.  She is found to be generally deconditioned.  I wrapped the right ankle with an ACE wrap for her comfort.  She was able to ambulate with a walker 20' with PWB RLE.  She was very fearful of falling during gait.  Ice was applied to the ankle at the end of my visit.  I am recommending HHPT for maximizing strength and mobility.    Follow Up Recommendations Home health PT    Equipment Recommendations    none   Recommendations for Other Services   none    Precautions / Restrictions Precautions Precautions: Fall Restrictions Weight Bearing Restrictions: No      Mobility  Bed Mobility Overal bed mobility: Modified Independent                Transfers Overall transfer level: Needs assistance Equipment used: Rolling walker (2 wheeled) Transfers: Sit to/from Stand Sit to Stand: Mod assist;Min assist         General transfer comment: min assist with sit to stand from the bed but moderate assist to stand from the commode  Ambulation/Gait Ambulation/Gait assistance: Min assist Ambulation Distance (Feet): 20 Feet Assistive device: Rolling walker (2  wheeled) Gait Pattern/deviations: Trunk flexed;Antalgic;Decreased stride length   Gait velocity interpretation: <1.8 ft/sec, indicative of risk for recurrent falls General Gait Details: pt is very nervous during gait, worried about falling...she has "moderate pain" in the right ankle with weight bearing, no dizziness  Stairs            Wheelchair Mobility    Modified Rankin (Stroke Patients Only)       Balance Overall balance assessment: History of Falls;Needs assistance Sitting-balance support: No upper extremity supported;Feet supported Sitting balance-Leahy Scale: Good     Standing balance support: Bilateral upper extremity supported Standing balance-Leahy Scale: Good                               Pertinent Vitals/Pain Pain Assessment: No/denies pain    Home Living Family/patient expects to be discharged to:: Private residence Living Arrangements: Spouse/significant other;Children Available Help at Discharge: Family;Available 24 hours/day Type of Home: House Home Access: Stairs to enter Entrance Stairs-Rails: Can reach both Entrance Stairs-Number of Steps: 4 Home Layout: One level Home Equipment: Toilet riser;Grab bars - tub/shower;Walker - standard      Prior Function Level of Independence: Independent with assistive device(s)               Hand Dominance   Dominant Hand: Right    Extremity/Trunk Assessment  Lower Extremity Assessment: RLE deficits/detail;Generalized weakness RLE Deficits / Details: right ankle is edemetous with moderate pain with DF/PF.Marland KitchenMarland KitchenX rays are negative for fracture    Cervical / Trunk Assessment: Kyphotic  Communication   Communication: No difficulties  Cognition Arousal/Alertness: Awake/alert Behavior During Therapy: WFL for tasks assessed/performed Overall Cognitive Status: Within Functional Limits for tasks assessed                      General Comments      Exercises         Assessment/Plan    PT Assessment Patient needs continued PT services  PT Diagnosis Difficulty walking;Abnormality of gait;Generalized weakness;Acute pain   PT Problem List Decreased strength;Decreased activity tolerance;Decreased mobility;Pain  PT Treatment Interventions Gait training;Functional mobility training;Therapeutic exercise;Patient/family education   PT Goals (Current goals can be found in the Care Plan section) Acute Rehab PT Goals Patient Stated Goal: none stated PT Goal Formulation: With patient Time For Goal Achievement: 02/19/15 Potential to Achieve Goals: Good    Frequency Min 3X/week   Barriers to discharge Inaccessible home environment 4 steps into home...will need instruction in stairs before d/c    Co-evaluation               End of Session Equipment Utilized During Treatment: Gait belt Activity Tolerance: Patient tolerated treatment well Patient left: in chair;with call bell/phone within reach Nurse Communication: Mobility status    Functional Assessment Tool Used: clinical judgement Functional Limitation: Mobility: Walking and moving around Mobility: Walking and Moving Around Current Status (Z6109): At least 20 percent but less than 40 percent impaired, limited or restricted Mobility: Walking and Moving Around Goal Status [phone]): At least 1 percent but less than 20 percent impaired, limited or restricted    Time: 1410-1500 PT Time Calculation (min) (ACUTE ONLY): 50 min   Charges:   PT Evaluation $Initial PT Evaluation Tier I: 1 Procedure PT Treatments $Self Care/Home Management: 8-22   PT G Codes:   PT G-Codes **NOT FOR INPATIENT CLASS** Functional Assessment Tool Used: clinical judgement Functional Limitation: Mobility: Walking and moving around Mobility: Walking and Moving Around Current Status (U9811): At least 20 percent but less than 40 percent impaired, limited or restricted Mobility: Walking and Moving Around Goal Status  [phone]): At least 1 percent but less than 20 percent impaired, limited or restricted    Konrad Penta  PT 02/05/2015, 3:06 PM [phone]

## 2015-02-05 NOTE — Progress Notes (Signed)
Patient has been incontinent, unable to get UA.  Orders obtained from MD for I&O cath.

## 2015-02-05 NOTE — H&P (Signed)
PCP:   [name], MD   Chief Complaint:  Verna Czech out  HPI: 64 year old female who   has a past medical history of Chest pain; Congestive heart failure; PAF (paroxysmal atrial fibrillation) (2009); Chronic anticoagulation; Diabetes mellitus, type 2; Syncope; GERD (gastroesophageal reflux disease); IBS (irritable bowel syndrome); Hiatal hernia; Gastroparesis; Hyperlipidemia; Hypertension; Hypothyroid; Chronic LBP; Obesity; OSA on CPAP; Anemia; Barrett's esophagus; Seizures; Pulmonary hypertension (01/2015); and Shortness of breath dyspnea. Patient recently was discharged from the allergy service after she was treated for congestive heart failure. Patient has history of pulmonary hypertension. She was discharged on high-dose Lasix 60 mg twice a day. As per patient she has been feeling weak over the past few days, also has been feeling awfully thirsty. She denies chest pain or shortness of breath. Today when patient went to the bathroom to finish our she was so weak that she lost all the strength in the legs and then down onto the floor. When daughter helped the patient to sit on the commode. Patient again passed out after bowel movement. EMS was called and patient was found to be hypotensive. In the ED she has mild elevation of troponin 0.04, blood pressure is improved after IV fluids.  Allergies:   Allergies  Allergen Reactions  . Ibuprofen     Upsets her stomach and causes abdominal pain  . Celexa [Citalopram Hydrobromide] Other (See Comments)    Dyskinesia  . Codeine Nausea And Vomiting    HALLUCINATIONS  . Reglan [Metoclopramide] Other (See Comments)    DYSKINESIA      Past Medical History  Diagnosis Date  . Chest pain     Negative cardiac catheterization in 2002; negative stress nuclear study in 2008  . Congestive heart failure     Normal LV systolic function  . PAF (paroxysmal atrial fibrillation) 2009  . Chronic anticoagulation   . Diabetes mellitus, type 2    Insulin therapy; exacerbated by prednisone  . Syncope     Admitted 05/2009; magnetic resonance imagin/ MRA - negative; etiology thought to be orthostasis secondary to drugs and dehydration  . GERD (gastroesophageal reflux disease)   . IBS (irritable bowel syndrome)   . Hiatal hernia   . Gastroparesis     99% retention 05/2008 on GES  . Hyperlipidemia   . Hypertension   . Hypothyroid   . Chronic LBP     Surgical intervention in 1996  . Obesity   . OSA on CPAP   . Anemia     H/H of 10/30 with a normal MCV in 12/09  . Barrett's esophagus     Diagnosed 1995. Last EGD 2016-NO BARRETT'S.   . Seizures   . Pulmonary hypertension 01/2015  . Shortness of breath dyspnea     Past Surgical History  Procedure Laterality Date  . Cardiovascular stress test  2008    Stress nuclear study  . Back surgery  1996  . Laparoscopic cholecystectomy  1990s  . Total abdominal hysterectomy  1999  . Laminectomy  1995    L4-L5  . Carpal tunnel release  1994  . Esophagogastroduodenoscopy  2008    Barrett's without dysplasia. esphagus dilated. antral erosions, h.pylori serologies negative.  . Colonoscopy  11/2011    Dr. Darrick Penna: Internal hemorrhoids, mild diverticulosis. Random colon biopsies negative.  . Givens capsule study  12/07/2011    Proximal small bowel, rare AVM. Distal small bowel, multiple ulcers noted  . Esophagogastroduodenoscopy  11/2011    Dr. Darrick Penna: Barrett's esophagus, mild gastritis, diverticulum  in the second portion of the duodenum repeat EGD 3 years. Small bowel biopsies negative. Gastric biopsy show reactive gastropathy but no H. pylori. Esophageal biopsies consistent with GERD. Next EGD 11/2014  . Givens capsule study N/A 09/27/2013    Distal small bowel ulcers extending to TI.  Marland Kitchen Givens capsule study N/A 10/10/2013    Procedure: GIVENS CAPSULE STUDY;  Surgeon: West Bali, MD;  Location: [facility];  Service: Endoscopy;  Laterality: N/A;  7:30  . Colonoscopy N/A 11/08/2013    SLF:  Normal mucosa in the terminal ileum/The colon IS redundant/  Moderate diverticulosis throughout the entire colon. ileum bx benign. colon bx benign  . Esophageal biopsy N/A 11/08/2013    Procedure: BIOPSY  / Tissue sampling / ulcers present in small intestine;  Surgeon: West Bali, MD;  Location: [facility];  Service: Endoscopy;  Laterality: N/A;  . Cardiac catheterization  2002  . Right heart catheterization N/A 01/10/2014    Procedure: RIGHT HEART CATH;  Surgeon: Lesleigh Noe, MD;  Location: [facility] CATH LAB;  Service: Cardiovascular;  Laterality: N/A;  . Left heart catheterization with coronary angiogram  01/10/2014    Procedure: LEFT HEART CATHETERIZATION WITH CORONARY ANGIOGRAM;  Surgeon: Lesleigh Noe, MD;  Location: [facility] CATH LAB;  Service: Cardiovascular;;  . Esophagogastroduodenoscopy N/A 11/21/2014    Procedure: ESOPHAGOGASTRODUODENOSCOPY (EGD);  Surgeon: West Bali, MD;  Location: [facility];  Service: Endoscopy;  Laterality: N/A;  12:00 - moved to 12:45 - office to notify  . Cardiac catheterization N/A 01/26/2015    Procedure: Right Heart Cath;  Surgeon: Laurey Morale, MD;  Location: [facility] INVASIVE CV LAB;  Service: Cardiovascular;  Laterality: N/A;    Prior to Admission medications   Medication Sig Start Date End Date Taking? Authorizing Provider  amiodarone (PACERONE) 200 MG tablet Take 1 tablet (200 mg total) by mouth 2 (two) times daily. 01/29/15   Graciella Freer, PA-C  carvedilol (COREG) 3.125 MG tablet Take 3 tablets (9.375 mg total) by mouth 2 (two) times daily with a meal. 01/29/15   Graciella Freer, PA-C  dicyclomine (BENTYL) 10 MG capsule 1 po 30 mins before meals up to three times a day or q4h prn for abdominal pain/diarrhea. NO MORE 8 DOSES IN A DAY. Patient taking differently: Take 10 mg by mouth daily as needed. 1 po 30 mins before meals up to three times a day or q4h prn for abdominal pain/diarrhea. NO MORE 8 DOSES IN A DAY. 11/21/14   West Bali,  MD  dronabinol (MARINOL) 2.5 MG capsule Take 1 capsule (2.5 mg total) by mouth 2 (two) times daily before a meal. 06/04/14   West Bali, MD  DULoxetine (CYMBALTA) 30 MG capsule Take 30 mg by mouth 2 (two) times daily.    Historical Provider, MD  ELIQUIS 5 MG TABS tablet TAKE 1 TABLET BY MOUTH TWICE DAILY. 07/24/14   Antoine Poche, MD  furosemide (LASIX) 20 MG tablet Take 3 tablets (60 mg total) by mouth 2 (two) times daily. 01/29/15   Graciella Freer, PA-C  glipiZIDE (GLUCOTROL) 10 MG tablet Take 10 mg by mouth daily before breakfast.     Historical Provider, MD  Insulin Glargine (LANTUS SOLOSTAR) 100 UNIT/ML Solostar Pen Inject 20-50 Units into the skin at bedtime as needed. Patient takes 20 units every night at bedtime if over 178.  Takes 50 units if her blood sugar is above 300.    Historical  Provider, MD  levETIRAcetam (KEPPRA) 250 MG tablet Take 1 tablet (250 mg total) by mouth 2 (two) times daily. 11/10/12   Gareth Morgan, MD  levothyroxine (SYNTHROID, LEVOTHROID) 137 MCG tablet Take 137 mcg by mouth daily. 10/14/14   Historical Provider, MD  lisinopril (PRINIVIL,ZESTRIL) 5 MG tablet Take 1 tablet (5 mg total) by mouth daily. 01/29/15   Graciella Freer, PA-C  loperamide (IMODIUM) 2 MG capsule TAKE (1) CAPSULE THREE TIMES DAILY AS NEEDED FOR DIARRHEA OR LOOSE STOOLS. 01/20/15   Nira Retort, NP  LORazepam (ATIVAN) 1 MG tablet Take 1-2 mg by mouth daily as needed for anxiety or sleep. For anxiety    Historical Provider, MD  magnesium oxide (MAG-OX) 400 MG tablet Take 1 tablet (400 mg total) by mouth daily. 10/06/14   Antoine Poche, MD  metaxalone (SKELAXIN) 800 MG tablet Take 800 mg by mouth 2 (two) times daily.  10/04/14   Historical Provider, MD  metFORMIN (GLUCOPHAGE) 500 MG tablet Take 1,000 mg by mouth 2 (two) times daily with a meal.     Historical Provider, MD  Multiple Vitamin (MULTIVITAMIN WITH MINERALS) TABS tablet Take 1 tablet by mouth daily.    Historical Provider, MD    nitroGLYCERIN (NITROSTAT) 0.4 MG SL tablet Place 0.4 mg under the tongue every 5 (five) minutes as needed for chest pain. For chest pain     Historical Provider, MD  omeprazole (PRILOSEC) 20 MG capsule Take 20 mg by mouth 2 (two) times daily.      Historical Provider, MD  ondansetron (ZOFRAN) 4 MG tablet Take 4 mg by mouth every 6 (six) hours as needed. For nausea 03/15/14   Historical Provider, MD  POLY-IRON 150 150 MG capsule TAKE 1 CAPSULE BY MOUTH 2 TIMES DAILY. 01/19/15   Ellouise Newer, PA-C  potassium chloride SA (K-DUR,KLOR-CON) 20 MEQ tablet Take 2 tablets (40 mEq total) by mouth once. 01/29/15   Graciella Freer, PA-C  vitamin B-12 (CYANOCOBALAMIN) 500 MCG tablet Take 500 mcg by mouth daily.    Historical Provider, MD    Social History:  reports that she quit smoking about 31 years ago. She started smoking about 48 years ago. She has never used smokeless tobacco. She reports that she does not drink alcohol or use illicit drugs.  Family History  Problem Relation Age of Onset  . Hypertension Mother   . Alzheimer's disease Mother   . Stroke Mother   . Heart attack Mother   . Heart disease Neg Hx   . Hypertension Other   . Colon cancer Neg Hx      All the positives are listed in BOLD  Review of Systems:  HEENT: Headache, blurred vision, runny nose, sore throat Neck: Hypothyroidism, hyperthyroidism,,lymphadenopathy Chest : Shortness of breath, history of COPD, Asthma Heart : Chest pain, history of coronary arterey disease GI:  Nausea, vomiting, diarrhea, constipation, GERD GU: Dysuria, urgency, frequency of urination, hematuria Neuro: Stroke, seizures, syncope Psych: Depression, anxiety, hallucinations   Physical Exam: Blood pressure 99/63, pulse 74, temperature 97.6 F (36.4 C), temperature source Oral, resp. rate 14, SpO2 95 %. Constitutional:   Patient is a well-developed and well-nourished female* in no acute distress and cooperative with exam. Head:  Normocephalic and atraumatic Mouth: Mucus membranes moist Eyes: PERRL, EOMI, conjunctivae normal Neck: Supple, No Thyromegaly Cardiovascular: RRR, S1 normal, S2 normal Pulmonary/Chest: CTAB, no wheezes, rales, or rhonchi Abdominal: Soft. Non-tender, non-distended, bowel sounds are normal, no masses, organomegaly, or guarding present.  Neurological:  A&O x3, Strength is normal and symmetric bilaterally, cranial nerve II-XII are grossly intact, no focal motor deficit, sensory intact to light touch bilaterally.  Extremities : No Cyanosis, Clubbing or Edema  Labs on Admission:  Basic Metabolic Panel:  Recent Labs Lab 01/29/15 0410 01/29/15 1140 02/04/15 2341  NA 138 138 137  K 3.6 3.7 4.0  CL 99* 99* 97*  CO2 30 32 27  GLUCOSE 161* 195* 206*  BUN 17 18 44*  CREATININE 0.78 0.83 1.26*  CALCIUM 9.3 9.4 9.3  MG  --  1.9  --    Liver Function Tests:  Recent Labs Lab 02/04/15 2341  AST 24  ALT 16  ALKPHOS 69  BILITOT 0.8  PROT 7.7  ALBUMIN 4.0   No results for input(s): LIPASE, AMYLASE in the last 168 hours. No results for input(s): AMMONIA in the last 168 hours. CBC:  Recent Labs Lab 02/04/15 2341  WBC 8.2  NEUTROABS 5.1  HGB 11.6*  HCT 38.3  MCV 83.4  PLT 162   Cardiac Enzymes:  Recent Labs Lab 02/04/15 2341  TROPONINI 0.04*    BNP (last 3 results)  Recent Labs  07/20/14 1702 01/26/15 1149 01/29/15 0410  BNP 576.0* 566.0* 265.7*    ProBNP (last 3 results)  Recent Labs  04/04/14 2327  PROBNP 1689.0*    CBG:  Recent Labs Lab 01/29/15 0726 02/05/15  GLUCAP 156* 182*    Radiological Exams on Admission: Dg Chest Port 1 View  02/05/2015   CLINICAL DATA:  Dyspnea this morning. Chronic atrial fibrillation and pulmonary hypertension  EXAM: PORTABLE CHEST - 1 VIEW  COMPARISON:  07/20/2014  FINDINGS: There is marked cardiomegaly, unchanged. There is a linear scarring in the left base, unchanged. There is mild vascular congestion and minimal  perihilar ground-glass opacity. There is no large effusion  IMPRESSION: Marked cardiomegaly. Mild congestive changes in the central vasculature.   Electronically Signed   By: Ellery Plunk M.D.   On: 02/05/2015 01:34    EKG: Independently reviewed. Atrial fibrillation, nonspecific T wave abnormality   Assessment/Plan Active Problems:   Hypothyroidism   Essential hypertension   Congestive heart failure   Chronic anticoagulation   Syncope   DM type 2 (diabetes mellitus, type 2)   Syncope and collapse  64 year old female with history of hypertension, CHF, pulmonary hypertension now being admitted with syncope from dehydration due to diuresis with Lasix. Patient's BUN/creatinine is elevated 44/1.26, her baseline 18/0.83. Will hold Lasix and other antihypertensives medications. Start gentle IV hydration with normal saline at 50 mL per hour. Continue Lasix 20 units at bedtime and sliding scale insulin with NovoLog. Patient has history of A. fib and is currently on chronic anticoagulation, will continue with Eliquis. We will also cycle troponin every 6 hours 3 We'll consult cardiology in a.m. to adjust the dose of Lasix.  Code status: Patient is full code  Family discussion: Admission, patients condition and plan of care including tests being ordered have been discussed with the patient and *her daughter at bedside* who indicate understanding and agree with the plan and Code Status.   Time Spent on Admission: 60 min  Calvina Liptak S [facility] Pager: [phone] 02/05/2015, 1:39 AM  If 7PM-7AM, please contact night-coverage  [email or web address]  Password [email or web address]

## 2015-02-05 NOTE — ED Notes (Signed)
Pt. O2 sats dropping to 87% when pt. Falling asleep. Pt. Placed on 2L [place]. O2 sats returned to 94%.

## 2015-02-05 NOTE — ED Notes (Signed)
Dr Llama at bedside.  

## 2015-02-06 DIAGNOSIS — E860 Dehydration: Secondary | ICD-10-CM | POA: Diagnosis not present

## 2015-02-06 DIAGNOSIS — Z7901 Long term (current) use of anticoagulants: Secondary | ICD-10-CM | POA: Diagnosis not present

## 2015-02-06 DIAGNOSIS — I10 Essential (primary) hypertension: Secondary | ICD-10-CM | POA: Diagnosis not present

## 2015-02-06 DIAGNOSIS — R55 Syncope and collapse: Secondary | ICD-10-CM | POA: Diagnosis not present

## 2015-02-06 LAB — GLUCOSE, CAPILLARY
GLUCOSE-CAPILLARY: 124 mg/dL — AB (ref 65–99)
GLUCOSE-CAPILLARY: 173 mg/dL — AB (ref 65–99)
Glucose-Capillary: 107 mg/dL — ABNORMAL HIGH (ref 65–99)
Glucose-Capillary: 130 mg/dL — ABNORMAL HIGH (ref 65–99)

## 2015-02-06 LAB — CBC
HCT: 36.3 % (ref 36.0–46.0)
Hemoglobin: 10.9 g/dL — ABNORMAL LOW (ref 12.0–15.0)
MCH: 25.4 pg — ABNORMAL LOW (ref 26.0–34.0)
MCHC: 30 g/dL (ref 30.0–36.0)
MCV: 84.6 fL (ref 78.0–100.0)
PLATELETS: 139 10*3/uL — AB (ref 150–400)
RBC: 4.29 MIL/uL (ref 3.87–5.11)
RDW: 20.6 % — AB (ref 11.5–15.5)
WBC: 7.5 10*3/uL (ref 4.0–10.5)

## 2015-02-06 LAB — BASIC METABOLIC PANEL
ANION GAP: 9 (ref 5–15)
BUN: 36 mg/dL — ABNORMAL HIGH (ref 6–20)
CALCIUM: 9.2 mg/dL (ref 8.9–10.3)
CO2: 29 mmol/L (ref 22–32)
Chloride: 101 mmol/L (ref 101–111)
Creatinine, Ser: 0.95 mg/dL (ref 0.44–1.00)
GFR calc Af Amer: >60 mL/min (ref >60)
Glucose, Bld: 124 mg/dL — ABNORMAL HIGH (ref 65–99)
POTASSIUM: 3.7 mmol/L (ref 3.5–5.1)
Sodium: 139 mmol/L (ref 135–145)

## 2015-02-06 LAB — HEMOGLOBIN A1C
HEMOGLOBIN A1C: 7.4 % — AB (ref 4.8–5.6)
Mean Plasma Glucose: 166 mg/dL

## 2015-02-06 MED ORDER — CIPROFLOXACIN HCL 250 MG PO TABS
500.0000 mg | ORAL_TABLET | Freq: Two times a day (BID) | ORAL | Status: DC
  Administered 2015-02-06 – 2015-02-07 (×3): 500 mg via ORAL
  Filled 2015-02-06 (×3): qty 2

## 2015-02-06 MED ORDER — CIPROFLOXACIN HCL 500 MG PO TABS: 500.0000 mg | ORAL_TABLET | Freq: Two times a day (BID) | ORAL | Status: DC

## 2015-02-06 MED ORDER — SODIUM CHLORIDE 0.9 % IV BOLUS (SEPSIS)
500.0000 mL | Freq: Once | INTRAVENOUS | Status: AC
  Administered 2015-02-06: 500 mL via INTRAVENOUS

## 2015-02-06 MED ORDER — FUROSEMIDE 40 MG PO TABS: 40.0000 mg | ORAL_TABLET | Freq: Every day | ORAL | Status: DC

## 2015-02-06 NOTE — Discharge Summary (Signed)
Physician Discharge Summary  [name]:[id number] DOB: 1951/04/06 DOA: 02/04/2015  PCP: [name], MD  Admit date: 02/04/2015 Discharge date: 02/06/2015  Time spent: 35 minutes  Recommendations for Outpatient Follow-up:  1. Please follow-up on volume status, past history of congestive heart failure, she was admitted for hypovolemia likely secondary to diuretic therapy. Her Lasix dose was decreased from 60 mg by mouth twice a day to 40 mg by mouth daily 2. Prior to discharge she was set up with home health services for PT and RN. RN to provide assistance with disease management, and monitoring of volume status.    Discharge Diagnoses:  Active Problems:   Hypothyroidism   Essential hypertension   Congestive heart failure   Chronic anticoagulation   Syncope   DM type 2 (diabetes mellitus, type 2)   Syncope and collapse   Dehydration   Discharge Condition: Stable  Diet recommendation: Heart healthy  Filed Weights   02/05/15 0415  Weight: 74.7 kg (164 lb 10.9 oz)    History of present illness:  64 year old female who  has a past medical history of Chest pain; Congestive heart failure; PAF (paroxysmal atrial fibrillation) (2009); Chronic anticoagulation; Diabetes mellitus, type 2; Syncope; GERD (gastroesophageal reflux disease); IBS (irritable bowel syndrome); Hiatal hernia; Gastroparesis; Hyperlipidemia; Hypertension; Hypothyroid; Chronic LBP; Obesity; OSA on CPAP; Anemia; Barrett's esophagus; Seizures; Pulmonary hypertension (01/2015); and Shortness of breath dyspnea. Patient recently was discharged from the allergy service after she was treated for congestive heart failure. Patient has history of pulmonary hypertension. She was discharged on high-dose Lasix 60 mg twice a day. As per patient she has been feeling weak over the past few days, also has been feeling awfully thirsty. She denies chest pain or shortness of breath. Today when patient went to the bathroom to finish  our she was so weak that she lost all the strength in the legs and then down onto the floor. When daughter helped the patient to sit on the commode. Patient again passed out after bowel movement. EMS was called and patient was found to be hypotensive. In the ED she has mild elevation of troponin 0.04, blood pressure is improved after IV fluids.  Hospital Course:  Patient is a pleasant 64 year old female with past medical history of congestive heart failure and pulmonary hypertension having hospitalization for acute CHF, diuresed with IV Lasix. She was discharged on Lasix 60 mg by mouth twice a day. She presents with complaints of generalized weakness, having a syncopal event or I in the bathroom. On presentation she was found to be hypotensive having blood pressures of 87/67 and 95/52. Symptoms felt to be secondary to dehydration as diuretic therapy was discontinued and given gentle IV fluid hydration  1. Syncope -Likely secondary to hypovolemia/dehydration after recent hospitalization for acute CHF at which time she was treated with IV Lasix. Patient was discharged on Lasix 60 mg by mouth twice a day. -Reported feeling much better this morning -Prior to discharge she was set up with home health services for home RN to assist with disease management -Please follow-up on volume status  2. Chronic atrial fibrillation -She remains anticoagulated with Eliquis -Continue amiodarone 200 mg by mouth twice a day -Remains rate controlled.  4. Chronic diastolic congestive heart failure -Patient recently admitted for acute CHF undergoing IV diuresis. -Last transthoracic echocardiogram performed 01/27/2015 that showed preserved EF of 55-60%, having tolerated hypertension, severely dilated left and right atria -She had previously been on Lasix 60 mg by mouth twice a day.  Because of hypovolemia her Lasix dose was decreased to 40 mg by mouth daily -Please follow-up on volume status.  5. Right ankle  pain -Patient having a syncopal event about from overnight reporting significant pain with passive and active movement over her right ankle. -Two-view x-ray of right ankle did not reveal acute bony abnormalities  6.  Acute kidney injury -Likely secondary to hypovolemia, cleft showing creatinine coming down from 1.26 on admission to 0.95 on day of discharge. This likely resulted from hypovolemia   Consultations:  Physical therapy  Discharge Exam: Filed Vitals:   02/06/15 0552  BP: 103/87  Pulse: 97  Temp: 98.3 F (36.8 C)  Resp: 20    General: Patient stated feeling better this morning, think she can go home today. Cardiovascular: Irregular rate and rhythm normal S1-S2 Respiratory: Normal respiratory effort, lungs clear to auscultation bilaterally Musculoskeletal: She has swelling over her right ankle, there is some pain with passive and active movement overall though seems better from yesterday.  Discharge Instructions   Discharge Instructions    (HEART FAILURE PATIENTS) Call MD:  Anytime you have any of the following symptoms: 1) 3 pound weight gain in 24 hours or 5 pounds in 1 week 2) shortness of breath, with or without a dry hacking cough 3) swelling in the hands, feet or stomach 4) if you have to sleep on extra pillows at night in order to breathe.    Complete by:  As directed      Call MD for:  difficulty breathing, headache or visual disturbances    Complete by:  As directed      Call MD for:  extreme fatigue    Complete by:  As directed      Call MD for:  hives    Complete by:  As directed      Call MD for:  persistant dizziness or light-headedness    Complete by:  As directed      Call MD for:  persistant nausea and vomiting    Complete by:  As directed      Call MD for:  redness, tenderness, or signs of infection (pain, swelling, redness, odor or green/yellow discharge around incision site)    Complete by:  As directed      Call MD for:  severe uncontrolled pain     Complete by:  As directed      Call MD for:  temperature >100.4    Complete by:  As directed      Call MD for:    Complete by:  As directed      Diet - low sodium heart healthy    Complete by:  As directed      Increase activity slowly    Complete by:  As directed           Current Discharge Medication List    START taking these medications   Details  ciprofloxacin (CIPRO) 500 MG tablet Take 1 tablet (500 mg total) by mouth 2 (two) times daily. Qty: 6 tablet, Refills: 0      CONTINUE these medications which have CHANGED   Details  furosemide (LASIX) 40 MG tablet Take 1 tablet (40 mg total) by mouth daily. Qty: 30 tablet, Refills: 1      CONTINUE these medications which have NOT CHANGED   Details  amiodarone (PACERONE) 200 MG tablet Take 1 tablet (200 mg total) by mouth 2 (two) times daily. Qty: 60 tablet, Refills: 6    dicyclomine (  BENTYL) 10 MG capsule 1 po 30 mins before meals up to three times a day or q4h prn for abdominal pain/diarrhea. NO MORE 8 DOSES IN A DAY. Qty: 45 capsule, Refills: 11    dronabinol (MARINOL) 2.5 MG capsule Take 1 capsule (2.5 mg total) by mouth 2 (two) times daily before a meal. Qty: 60 capsule, Refills: 0    DULoxetine (CYMBALTA) 30 MG capsule Take 30 mg by mouth 2 (two) times daily.    ELIQUIS 5 MG TABS tablet TAKE 1 TABLET BY MOUTH TWICE DAILY. Qty: 60 tablet, Refills: 3    glipiZIDE (GLUCOTROL) 10 MG tablet Take 10 mg by mouth daily before breakfast.     Insulin Glargine (LANTUS SOLOSTAR) 100 UNIT/ML Solostar Pen Inject 20-50 Units into the skin at bedtime as needed. Patient takes 20 units every night at bedtime if over 178.  Takes 50 units if her blood sugar is above 300.    levETIRAcetam (KEPPRA) 250 MG tablet Take 1 tablet (250 mg total) by mouth 2 (two) times daily. Qty: 60 tablet, Refills: 11    levothyroxine (SYNTHROID, LEVOTHROID) 137 MCG tablet Take 137 mcg by mouth daily.    loperamide (IMODIUM) 2 MG capsule TAKE (1)  CAPSULE THREE TIMES DAILY AS NEEDED FOR DIARRHEA OR LOOSE STOOLS. Qty: 30 capsule, Refills: 1    LORazepam (ATIVAN) 1 MG tablet Take 1 mg by mouth at bedtime. For anxiety    magnesium oxide (MAG-OX) 400 MG tablet Take 1 tablet (400 mg total) by mouth daily. Qty: 30 tablet, Refills: 6    metaxalone (SKELAXIN) 800 MG tablet Take 800 mg by mouth 2 (two) times daily.     metFORMIN (GLUCOPHAGE) 500 MG tablet Take 1,000 mg by mouth 2 (two) times daily with a meal.     Multiple Vitamin (MULTIVITAMIN WITH MINERALS) TABS tablet Take 1 tablet by mouth daily.    nitroGLYCERIN (NITROSTAT) 0.4 MG SL tablet Place 0.4 mg under the tongue every 5 (five) minutes as needed for chest pain. For chest pain     omeprazole (PRILOSEC) 20 MG capsule Take 20 mg by mouth 2 (two) times daily.      ondansetron (ZOFRAN) 4 MG tablet Take 4 mg by mouth every 6 (six) hours as needed. For nausea    potassium chloride SA (K-DUR,KLOR-CON) 20 MEQ tablet Take 2 tablets (40 mEq total) by mouth once. Qty: 60 tablet, Refills: 6    vitamin B-12 (CYANOCOBALAMIN) 500 MCG tablet Take 500 mcg by mouth daily.    POLY-IRON 150 150 MG capsule TAKE 1 CAPSULE BY MOUTH 2 TIMES DAILY. Qty: 60 capsule, Refills: 3      STOP taking these medications     carvedilol (COREG) 3.125 MG tablet      lisinopril (PRINIVIL,ZESTRIL) 5 MG tablet        Allergies  Allergen Reactions  . Ibuprofen     Upsets her stomach and causes abdominal pain  . Celexa [Citalopram Hydrobromide] Other (See Comments)    Dyskinesia  . Codeine Nausea And Vomiting    HALLUCINATIONS  . Reglan [Metoclopramide] Other (See Comments)    DYSKINESIA   Follow-up Information    Follow up with [name], MD. Call today.   Specialty:  Family Medicine   Contact information:   [address] [place] [address] [phone]       Follow up with Ronie Spies, PA-C On 02/13/2015.   Specialties:  Cardiology, Radiology   Why:  at 1:00 pm   Contact  information:   [address] [place] [address] [phone]       Follow up with [name], MD In 1 week.   Specialty:  Family Medicine   Contact information:   [address] [place] [address] [phone]        The results of significant diagnostics from this hospitalization (including imaging, microbiology, ancillary and laboratory) are listed below for reference.    Significant Diagnostic Studies: Dg Chest Port 1 View  02/05/2015   CLINICAL DATA:  Dyspnea this morning. Chronic atrial fibrillation and pulmonary hypertension  EXAM: PORTABLE CHEST - 1 VIEW  COMPARISON:  07/20/2014  FINDINGS: There is marked cardiomegaly, unchanged. There is a linear scarring in the left base, unchanged. There is mild vascular congestion and minimal perihilar ground-glass opacity. There is no large effusion  IMPRESSION: Marked cardiomegaly. Mild congestive changes in the central vasculature.   Electronically Signed   By: Ellery Plunk M.D.   On: 02/05/2015 01:34   Dg Ankle Right Port  02/05/2015   CLINICAL DATA:  Fall.Ankle pain.  Initial evaluation .  EXAM: PORTABLE RIGHT ANKLE - 2 VIEW  COMPARISON:  None.  FINDINGS: Soft tissue swelling over the lateral malleolus. Diffuse osteopenia degenerative change. No evidence of displaced fracture. Peripheral vascular calcification .  IMPRESSION: 1. Soft tissue swelling. Diffuse osteopenia and degenerative change. No acute abnormality. Follow-up imaging in 7-10 days can be obtained if symptoms persist. 2. Peripheral vascular disease.   Electronically Signed   By: Maisie Fus  Register   On: 02/05/2015 11:32    Microbiology: No results found for this or any previous visit (from the past 240 hour(s)).   Labs: Basic Metabolic Panel:  Recent Labs Lab 02/04/15 2341 02/05/15 0321 02/06/15 0524  NA 137 134* 139  K 4.0 3.6 3.7  CL 97* 99* 101  CO2 27 26 29   GLUCOSE 206* 201* 124*  BUN 44* 46* 36*  CREATININE 1.26* 1.20* 0.95  CALCIUM 9.3  8.8* 9.2   Liver Function Tests:  Recent Labs Lab 02/04/15 2341 02/05/15 0321  AST 24 23  ALT 16 15  ALKPHOS 69 65  BILITOT 0.8 0.9  PROT 7.7 7.0  ALBUMIN 4.0 3.9   No results for input(s): LIPASE, AMYLASE in the last 168 hours. No results for input(s): AMMONIA in the last 168 hours. CBC:  Recent Labs Lab 02/04/15 2341 02/05/15 0321 02/06/15 0524  WBC 8.2 11.7* 7.5  NEUTROABS 5.1  --   --   HGB 11.6* 10.7* 10.9*  HCT 38.3 35.4* 36.3  MCV 83.4 83.7 84.6  PLT 162 159 139*   Cardiac Enzymes:  Recent Labs Lab 02/04/15 2341 02/05/15 0321 02/05/15 0852 02/05/15 1450  TROPONINI 0.04* 0.04* 0.03 0.03   BNP: BNP (last 3 results)  Recent Labs  07/20/14 1702 01/26/15 1149 01/29/15 0410  BNP 576.0* 566.0* 265.7*    ProBNP (last 3 results)  Recent Labs  04/04/14 2327  PROBNP 1689.0*    CBG:  Recent Labs Lab 02/05/15 0417 02/05/15 0729 02/05/15 1105 02/05/15 1657 02/05/15 2212  GLUCAP 198* 189* 130* 109* 150*       Signed:  Genette Huertas  [facility] 02/06/2015, 7:05 AM

## 2015-02-06 NOTE — Care Management Note (Signed)
Case Management Note  Patient Details  Name: [name] MRN: [id number] Date of Birth: 1950-12-20  Subjective/Objective:                    Action/Plan:   Expected Discharge Date:  02/09/15               Expected Discharge Plan:  Home w Home Health Services  In-House Referral:  NA  Discharge planning Services  CM Consult  Post Acute Care Choice:  Home Health Choice offered to:  Patient  DME Arranged:    DME Agency:     HH Arranged:  PT HH Agency:  [facility]  Status of Service:  Completed, signed off  Medicare Important Message Given:    Date Medicare IM Given:    Medicare IM give by:    Date Additional Medicare IM Given:    Additional Medicare Important Message give by:     If discussed at Long Length of Stay Meetings, dates discussed:    Additional Comments: Pt for discharge home today with [facility] PT with [facility] (per pts choice). Dareen Piano with [facility] is aware and will collect pts information from the chart. HH services to start within 48 hours of discharge. No new DME needs noted. Pt and pts nurse aware of discharge arrangements. Arlyss Queen Farnsworth, RN 02/06/2015, 8:53 AM

## 2015-02-07 DIAGNOSIS — R55 Syncope and collapse: Principal | ICD-10-CM

## 2015-02-07 DIAGNOSIS — N390 Urinary tract infection, site not specified: Secondary | ICD-10-CM | POA: Diagnosis present

## 2015-02-07 DIAGNOSIS — E860 Dehydration: Secondary | ICD-10-CM | POA: Diagnosis not present

## 2015-02-07 LAB — GLUCOSE, CAPILLARY
GLUCOSE-CAPILLARY: 100 mg/dL — AB (ref 65–99)
Glucose-Capillary: 129 mg/dL — ABNORMAL HIGH (ref 65–99)

## 2015-02-07 NOTE — Progress Notes (Signed)
[facility] PROGRESS NOTE  [name] KJZ:[id number] DOB: Sep 24, 1950 DOA: 02/04/2015 PCP: [name], MD    Code Status: Full code Family Communication: Discussed with daughter Disposition Plan: Discharge to home today with home health services.   Consultants:  None  Procedures:  None  Antibiotics:  Cipro  HPI/Subjective: Patient says that she feels much better today. She feels stronger. Less ankle pain.  Objective: Filed Vitals:   02/07/15 0520  BP: 106/77  Pulse:   Temp: 98.6 F (37 C)  Resp: 18   temperature 98.6. Oxygen saturation 96% on room air.  Intake/Output Summary (Last 24 hours) at 02/07/15 0954 Last data filed at 02/07/15 0840  Gross per 24 hour  Intake    240 ml  Output      0 ml  Net    240 ml   Filed Weights   02/05/15 0415  Weight: 74.7 kg (164 lb 10.9 oz)    Exam:   General:  64 year old woman in no acute distress.  Cardiovascular: Irregular, irregular.  Respiratory: Clear to auscultation bilaterally.  Abdomen: Positive bowel sounds, soft, nontender, nondistended.  Musculoskeletal/extremities: Right ankle with mild tenderness.   Neurologic: She is alert and oriented 3. Her speech is clear.  Data Reviewed: Basic Metabolic Panel:  Recent Labs Lab 02/04/15 2341 02/05/15 0321 02/06/15 0524  NA 137 134* 139  K 4.0 3.6 3.7  CL 97* 99* 101  CO2 27 26 29   GLUCOSE 206* 201* 124*  BUN 44* 46* 36*  CREATININE 1.26* 1.20* 0.95  CALCIUM 9.3 8.8* 9.2   Liver Function Tests:  Recent Labs Lab 02/04/15 2341 02/05/15 0321  AST 24 23  ALT 16 15  ALKPHOS 69 65  BILITOT 0.8 0.9  PROT 7.7 7.0  ALBUMIN 4.0 3.9   No results for input(s): LIPASE, AMYLASE in the last 168 hours. No results for input(s): AMMONIA in the last 168 hours. CBC:  Recent Labs Lab 02/04/15 2341 02/05/15 0321 02/06/15 0524  WBC 8.2 11.7* 7.5  NEUTROABS 5.1  --   --   HGB 11.6* 10.7* 10.9*  HCT 38.3 35.4* 36.3  MCV 83.4 83.7 84.6   PLT 162 159 139*   Cardiac Enzymes:  Recent Labs Lab 02/04/15 2341 02/05/15 0321 02/05/15 0852 02/05/15 1450  TROPONINI 0.04* 0.04* 0.03 0.03   BNP (last 3 results)  Recent Labs  07/20/14 1702 01/26/15 1149 01/29/15 0410  BNP 576.0* 566.0* 265.7*    ProBNP (last 3 results)  Recent Labs  04/04/14 2327  PROBNP 1689.0*    CBG:  Recent Labs Lab 02/06/15 0731 02/06/15 1148 02/06/15 1649 02/06/15 2100 02/07/15 0754  GLUCAP 124* 107* 130* 173* 100*    No results found for this or any previous visit (from the past 240 hour(s)).   Studies: No results found.  Scheduled Meds: . amiodarone  200 mg Oral BID  . antiseptic oral rinse  7 mL Mouth Rinse BID  . apixaban  5 mg Oral BID  . ciprofloxacin  500 mg Oral BID  . dronabinol  2.5 mg Oral BID AC  . DULoxetine  30 mg Oral BID  . insulin aspart  0-9 Units Subcutaneous TID WC  . insulin glargine  20 Units Subcutaneous Q2200  . levETIRAcetam  250 mg Oral BID  . levothyroxine  137 mcg Oral QAC breakfast  . pantoprazole  40 mg Oral Daily   Continuous Infusions:   Assessment and plan:  Principal Problem:   Syncope Active Problems:   Dehydration  Hypothyroidism   Essential hypertension   Congestive heart failure   Chronic anticoagulation   DM type 2 (diabetes mellitus, type 2)   Syncope and collapse   UTI (urinary tract infection)  PLEASE SEE DISCHARGE SUMMARY DICTATED BY DR. Vanessa Barbara ON 02/06/15. (THIS IS A FOLLOW-UP PROGRESS NOTE AS NO CHANGES HAVE BEEN MADE TO THE DISCHARGE SUMMARY).  The patient is clinically improved. We'll discharge the patient today; as stated by Dr. Vanessa Barbara on a lower dose of Lasix  Time spent: 20 minutes    [facility]  [facility] Pager [phone]. If 7PM-7AM, please contact night-coverage at [email or web address], password [facility] 02/07/2015, 9:54 AM  LOS: 2 days

## 2015-02-09 DIAGNOSIS — I5032 Chronic diastolic (congestive) heart failure: Secondary | ICD-10-CM | POA: Diagnosis not present

## 2015-02-09 DIAGNOSIS — M25571 Pain in right ankle and joints of right foot: Secondary | ICD-10-CM | POA: Diagnosis not present

## 2015-02-10 DIAGNOSIS — M25571 Pain in right ankle and joints of right foot: Secondary | ICD-10-CM | POA: Diagnosis not present

## 2015-02-10 DIAGNOSIS — I5032 Chronic diastolic (congestive) heart failure: Secondary | ICD-10-CM | POA: Diagnosis not present

## 2015-02-12 ENCOUNTER — Encounter: Payer: Self-pay | Admitting: Physician Assistant

## 2015-02-12 NOTE — Progress Notes (Signed)
Cardiology Office Note Date:  02/13/2015  Patient ID:  Tara, [name] May 12, 1951, MRN [id number] PCP:  [name], MD  Cardiologist: Dr. Wyline Mood and co-managed by Dr. Shirlee Latch with [facility]   Chief Complaint: follow-up of recent hospitalization for syncope  History of Present Illness: [name] is a 64 y.o. female with history of chronic diastolic CHF with mixed precap and post postcap pulmonary hypertension, HTN, HLD, paroxysmal atrial fibrillation, OSA on CPAP, DM, hypothyroidism, chronic anemia who presents for post-hospital follow-up of recent syncope. She has history of diastolic CHF but EF 10/2014 was 40%. She has history of mild pulm HTN and was referred to CHF team in 01/2015 for RHC due to discrepancy in PASP estimated by echo. This showed severely elevated R & L heart filling pressures as well as primarily pulmonary venous hypertension (may be additional component of PAH with PVR 4.6 WU). She was admitted for diuresis at that time. It was also noted that she had been in persistent atrial fib for months and amiodarone was added with plans for outpatient DCCV after about a month of therapy given recent worsening of CHF. She was sent home on Lasix 60mg  BID as well as apixaban. She was readmitted 8/3-02/06/15 with an episode of syncope. She had reported feeling weak for several days along with falls and significant thirst. She was hypotensive at 87/67 in the ER; admission also notable for AKI with Cr 1.26 on admission, improving to normal at discharge. Syncope was felt due to hypovolemia/dehydration. Lasix dose was decreased to 40mg  daily. Coreg and lisinopril were discontinued. Repeat echo 01/27/15 showed mod LVH, EF 55-60%, no RWMA, mild MR, severely dilated LA/RA, mod TR, mod increase PASP. Hgb in the 10 range, c/w prior. LFTs OK 02/05/15.  She comes in today for follow-up. She has not had any further events of syncope or presyncope but reports feeling generally  weak. Breathing has good days and bad days. Denies significant chest pain. Minimal edema on exam today. Drinks 5-6 large glasses of water daily. Appetite is slowly coming back. BP low in clinic today but denies acute dizziness. Did not take Lasix yet today. No bleeding, fever, orthopnea.  Past Medical History  Diagnosis Date  . Chest pain     Negative cardiac catheterization in 2002; negative stress nuclear study in 2008  . Chronic diastolic CHF (congestive heart failure)     a. EF predominantly normal during prior echoes but was 40% during 10/2014 echo. b. Most recent 01/2015 EF normal, 55-60%.  . Paroxysmal atrial fibrillation   . Chronic anticoagulation   . Diabetes mellitus, type 2     Insulin therapy; exacerbated by prednisone  . Syncope     a. Admitted 05/2009; magnetic resonance imagin/ MRA - negative; etiology thought to be orthostasis secondary to drugs and dehydration. b. Syncope 02/2015 also felt 2/2 dehydration.  Marland Kitchen GERD (gastroesophageal reflux disease)   . IBS (irritable bowel syndrome)   . Hiatal hernia   . Gastroparesis     99% retention 05/2008 on GES  . Hyperlipidemia   . Hypertension   . Hypothyroid   . Chronic LBP     Surgical intervention in 1996  . Obesity   . OSA on CPAP   . Anemia     H/H of 10/30 with a normal MCV in 12/09  . Barrett's esophagus     Diagnosed 1995. Last EGD 2016-NO BARRETT'S.   . Seizures   . Pulmonary hypertension 01/2015  a. Predominantly pulmonary venous hypertension but may be component of PAH.    Past Surgical History  Procedure Laterality Date  . Cardiovascular stress test  2008    Stress nuclear study  . Back surgery  1996  . Laparoscopic cholecystectomy  1990s  . Total abdominal hysterectomy  1999  . Laminectomy  1995    L4-L5  . Carpal tunnel release  1994  . Esophagogastroduodenoscopy  2008    Barrett's without dysplasia. esphagus dilated. antral erosions, h.pylori serologies negative.  . Colonoscopy  11/2011    Dr.  Darrick Penna: Internal hemorrhoids, mild diverticulosis. Random colon biopsies negative.  . Givens capsule study  12/07/2011    Proximal small bowel, rare AVM. Distal small bowel, multiple ulcers noted  . Esophagogastroduodenoscopy  11/2011    Dr. Darrick Penna: Barrett's esophagus, mild gastritis, diverticulum in the second portion of the duodenum repeat EGD 3 years. Small bowel biopsies negative. Gastric biopsy show reactive gastropathy but no H. pylori. Esophageal biopsies consistent with GERD. Next EGD 11/2014  . Givens capsule study N/A 09/27/2013    Distal small bowel ulcers extending to TI.  Marland Kitchen Givens capsule study N/A 10/10/2013    Procedure: GIVENS CAPSULE STUDY;  Surgeon: West Bali, MD;  Location: [facility];  Service: Endoscopy;  Laterality: N/A;  7:30  . Colonoscopy N/A 11/08/2013    SLF: Normal mucosa in the terminal ileum/The colon IS redundant/  Moderate diverticulosis throughout the entire colon. ileum bx benign. colon bx benign  . Esophageal biopsy N/A 11/08/2013    Procedure: BIOPSY  / Tissue sampling / ulcers present in small intestine;  Surgeon: West Bali, MD;  Location: [facility];  Service: Endoscopy;  Laterality: N/A;  . Cardiac catheterization  2002  . Right heart catheterization N/A 01/10/2014    Procedure: RIGHT HEART CATH;  Surgeon: Lesleigh Noe, MD;  Location: [facility] CATH LAB;  Service: Cardiovascular;  Laterality: N/A;  . Left heart catheterization with coronary angiogram  01/10/2014    Procedure: LEFT HEART CATHETERIZATION WITH CORONARY ANGIOGRAM;  Surgeon: Lesleigh Noe, MD;  Location: [facility] CATH LAB;  Service: Cardiovascular;;  . Esophagogastroduodenoscopy N/A 11/21/2014    ZOX:WRUE non-erosive gastritis/irregular z-line  . Cardiac catheterization N/A 01/26/2015    Procedure: Right Heart Cath;  Surgeon: Laurey Morale, MD;  Location: [facility] INVASIVE CV LAB;  Service: Cardiovascular;  Laterality: N/A;    Current Outpatient Prescriptions  Medication Sig Dispense Refill  .  amiodarone (PACERONE) 200 MG tablet Take 1 tablet (200 mg total) by mouth 2 (two) times daily. 60 tablet 6  . ciprofloxacin (CIPRO) 500 MG tablet Take 1 tablet (500 mg total) by mouth 2 (two) times daily. 6 tablet 0  . dicyclomine (BENTYL) 10 MG capsule 1 po 30 mins before meals up to three times a day or q4h prn for abdominal pain/diarrhea. NO MORE 8 DOSES IN A DAY. (Patient taking differently: Take 10 mg by mouth daily as needed. 1 po 30 mins before meals up to three times a day or q4h prn for abdominal pain/diarrhea. NO MORE 8 DOSES IN A DAY.) 45 capsule 11  . dronabinol (MARINOL) 2.5 MG capsule Take 1 capsule (2.5 mg total) by mouth 2 (two) times daily before a meal. 60 capsule 0  . DULoxetine (CYMBALTA) 30 MG capsule Take 30 mg by mouth 2 (two) times daily.    Marland Kitchen ELIQUIS 5 MG TABS tablet TAKE 1 TABLET BY MOUTH TWICE DAILY. 60 tablet 3  . furosemide (LASIX)  40 MG tablet Take 1 tablet (40 mg total) by mouth daily. 30 tablet 1  . glipiZIDE (GLUCOTROL) 10 MG tablet Take 10 mg by mouth daily before breakfast.     . Insulin Glargine (LANTUS SOLOSTAR) 100 UNIT/ML Solostar Pen Inject 20-50 Units into the skin at bedtime as needed. Patient takes 20 units every night at bedtime if over 178.  Takes 50 units if her blood sugar is above 300.    . levETIRAcetam (KEPPRA) 250 MG tablet Take 1 tablet (250 mg total) by mouth 2 (two) times daily. 60 tablet 11  . levothyroxine (SYNTHROID, LEVOTHROID) 137 MCG tablet Take 137 mcg by mouth daily.    Marland Kitchen loperamide (IMODIUM) 2 MG capsule TAKE (1) CAPSULE THREE TIMES DAILY AS NEEDED FOR DIARRHEA OR LOOSE STOOLS. 30 capsule 1  . LORazepam (ATIVAN) 1 MG tablet Take 1 mg by mouth at bedtime. For anxiety    . magnesium oxide (MAG-OX) 400 MG tablet Take 1 tablet (400 mg total) by mouth daily. 30 tablet 6  . metaxalone (SKELAXIN) 800 MG tablet Take 800 mg by mouth 2 (two) times daily.     . metFORMIN (GLUCOPHAGE) 500 MG tablet Take 1,000 mg by mouth 2 (two) times daily with a  meal.     . Multiple Vitamin (MULTIVITAMIN WITH MINERALS) TABS tablet Take 1 tablet by mouth daily.    . nitroGLYCERIN (NITROSTAT) 0.4 MG SL tablet Place 0.4 mg under the tongue every 5 (five) minutes as needed for chest pain. For chest pain     . omeprazole (PRILOSEC) 20 MG capsule Take 20 mg by mouth 2 (two) times daily.      . ondansetron (ZOFRAN) 4 MG tablet Take 4 mg by mouth every 6 (six) hours as needed. For nausea    . POLY-IRON 150 150 MG capsule TAKE 1 CAPSULE BY MOUTH 2 TIMES DAILY. 60 capsule 3  . potassium chloride SA (K-DUR,KLOR-CON) 20 MEQ tablet Take 2 tablets (40 mEq total) by mouth once. (Patient taking differently: Take 20 mEq by mouth 2 (two) times daily. ) 60 tablet 6  . vitamin B-12 (CYANOCOBALAMIN) 500 MCG tablet Take 500 mcg by mouth daily.     No current facility-administered medications for this visit.    Allergies:   Ibuprofen; Celexa; Codeine; and Reglan   Social History:  The patient  reports that she quit smoking about 31 years ago. She started smoking about 48 years ago. She has never used smokeless tobacco. She reports that she does not drink alcohol or use illicit drugs.   Family History:  The patient's family history includes Alzheimer's disease in her mother; Heart attack in her mother; Hypertension in her mother and other; Stroke in her mother. There is no history of Heart disease or Colon cancer.  ROS:  Please see the history of present illness.  All other systems are reviewed and otherwise negative.   PHYSICAL EXAM:  VS:  BP 92/60 mmHg  Pulse 90  Ht 5\' 6"  (1.676 m)  Wt 173 lb 6.4 oz (78.654 kg)  BMI 28.00 kg/m2  SpO2 96% BMI: Body mass index is 28 kg/(m^2). BP 90/60, recheck by me 92/60 and accurate. Well nourished pale WF in no acute distress in wheelchair - appears older than stated age HEENT: normocephalic, atraumatic Neck: no JVD, carotid bruits or masses Cardiac:  normal S1, S2; irregularly irregular but controlled rate, no murmurs, rubs, or  gallops Lungs:  clear to auscultation bilaterally, no wheezing, rhonchi or rales Abd: soft, nontender,  no hepatomegaly, + BS MS: no deformity or atrophy Ext: minimal LE edema Skin: warm and dry, no rash Neuro:  moves all extremities spontaneously, no focal abnormalities noted, follows commands Psych: euthymic mood, full affect  EKG:  Done today shows atrial fib with 69bpm, no acute ST-T changes,incomplete RBBB and LAFB  Recent Labs: 04/04/2014: Pro B Natriuretic peptide (BNP) 1689.0* 07/20/2014: TSH 13.130* 01/29/2015: B Natriuretic Peptide 265.7*; Magnesium 1.9 02/05/2015: ALT 15 02/06/2015: BUN 36*; Creatinine, Ser 0.95; Hemoglobin 10.9*; Platelets 139*; Potassium 3.7; Sodium 139  No results found for requested labs within last 365 days.   Estimated Creatinine Clearance: 62.5 mL/min (by C-G formula based on Cr of 0.95).   Wt Readings from Last 3 Encounters:  02/13/15 173 lb 6.4 oz (78.654 kg)  02/05/15 164 lb 10.9 oz (74.7 kg)  02/02/15 170 lb (77.111 kg)     Other studies reviewed: Additional studies/records reviewed today include: summarized above  ASSESSMENT AND PLAN:  1. Syncope felt due to recent dehydration - agree this was likely due to hypotension. Lisinopril and Coreg remain on hold. See below. 2. Chronic diastolic CHF - recent DC weight after diuresis earlier this month was 170. When she was very dry in the hospital she weighed in at 164. Today she is 173lb but does not appear significantly volume overloaded. Her blood pressure remains quite low and I am concerned that she is at risk for another fall if she continues on current dose of Lasix. Her daughter says their home cuff has not been able to register a number the last few days although it usually runs in the low 100s. She is not on any other meds that would cause this. I have asked her to hold Lasix today and recheck BP tomorrow. If BP >100 systolic, she can resume Lasix at 20mg  daily. Her daughter inquired about whether  home oxygen would help. She is 96% on RA in clinic today at rest. I offered to check an ambulatory O2 sat but the patient declined, saying she is not ready for that today. They have home health coming out to the house and we will ask home health to check this when she is ready. Reviewed salt and fluid restriction (but reinforced importance of meeting in the middle with adequate hydration). Will keep f/u as planned with CHF clinic. 3. Recent AKI - the patient was due for several labs that Dr. Wyline Mood had previously ordered. They are appropriate to run today - CMET, CBC, Mg, TSH. This will include recheck of Cr to trend. 4. Pulm HTN - recent echo showed moderately increased PASP.  5. Persistent atrial fibrillation - previously failed DCCV per patient. She is on amiodarone with plans to proceed with possible eventual repeat DCCV under the direction of CHF team. Denies any bleeding on anticoagulation.  Disposition: F/u with CHF clinic as planned 02/19/15.  Current medicines are reviewed at length with the patient today.  The patient did not have any concerns regarding medicines.  Signed, Ronie Spies PA-C 02/13/2015 1:44 PM     [facility] Location [address] [place], [place] [address] [phone]

## 2015-02-13 ENCOUNTER — Encounter: Payer: Self-pay | Admitting: Physician Assistant

## 2015-02-13 ENCOUNTER — Ambulatory Visit (INDEPENDENT_AMBULATORY_CARE_PROVIDER_SITE_OTHER): Payer: Medicare Other | Admitting: Physician Assistant

## 2015-02-13 VITALS — BP 92/60 | HR 90 | Ht 66.0 in | Wt 173.4 lb

## 2015-02-13 DIAGNOSIS — I4891 Unspecified atrial fibrillation: Secondary | ICD-10-CM | POA: Diagnosis not present

## 2015-02-13 DIAGNOSIS — I5032 Chronic diastolic (congestive) heart failure: Secondary | ICD-10-CM | POA: Diagnosis not present

## 2015-02-13 DIAGNOSIS — I272 Pulmonary hypertension, unspecified: Secondary | ICD-10-CM

## 2015-02-13 DIAGNOSIS — R55 Syncope and collapse: Secondary | ICD-10-CM

## 2015-02-13 DIAGNOSIS — I270 Primary pulmonary hypertension: Secondary | ICD-10-CM | POA: Diagnosis not present

## 2015-02-13 DIAGNOSIS — N179 Acute kidney failure, unspecified: Secondary | ICD-10-CM

## 2015-02-13 DIAGNOSIS — I482 Chronic atrial fibrillation: Secondary | ICD-10-CM | POA: Diagnosis not present

## 2015-02-13 DIAGNOSIS — I480 Paroxysmal atrial fibrillation: Secondary | ICD-10-CM

## 2015-02-13 DIAGNOSIS — M25571 Pain in right ankle and joints of right foot: Secondary | ICD-10-CM | POA: Diagnosis not present

## 2015-02-13 NOTE — Patient Instructions (Addendum)
Your physician recommends that you schedule a follow-up appointment in CHF clinic as Scheduled  Your physician recommends that you return for lab work as planned   HOLD LASIX TODAY  IF SYSTOLIC BLOOD PRESSURE UNDER 264 TOMORROW, RESUME LASIX AT 20 MG DAILY  PLEASE HAVE HOME HEALTH NURSE ASSESS AMBULATORY O2 SAT AT HOME  Thank you for choosing [facility]!

## 2015-02-14 LAB — MAGNESIUM: Magnesium: 1.1 mg/dL — ABNORMAL LOW (ref 1.5–2.5)

## 2015-02-14 LAB — COMPREHENSIVE METABOLIC PANEL
ALT: 7 U/L (ref 6–29)
AST: 12 U/L (ref 10–35)
Albumin: 3.5 g/dL — ABNORMAL LOW (ref 3.6–5.1)
Alkaline Phosphatase: 81 U/L (ref 33–130)
BUN: 23 mg/dL (ref 7–25)
CHLORIDE: 104 mmol/L (ref 98–110)
CO2: 24 mmol/L (ref 20–31)
CREATININE: 0.7 mg/dL (ref 0.50–0.99)
Calcium: 8.9 mg/dL (ref 8.6–10.4)
GLUCOSE: 76 mg/dL (ref 65–99)
Potassium: 4.7 mmol/L (ref 3.5–5.3)
Sodium: 139 mmol/L (ref 135–146)
TOTAL PROTEIN: 6.3 g/dL (ref 6.1–8.1)
Total Bilirubin: 0.5 mg/dL (ref 0.2–1.2)

## 2015-02-14 LAB — CBC
HCT: 32.2 % — ABNORMAL LOW (ref 36.0–46.0)
HEMOGLOBIN: 9.9 g/dL — AB (ref 12.0–15.0)
MCH: 25 pg — ABNORMAL LOW (ref 26.0–34.0)
MCHC: 30.7 g/dL (ref 30.0–36.0)
MCV: 81.3 fL (ref 78.0–100.0)
MPV: 11.1 fL (ref 8.6–12.4)
Platelets: 182 10*3/uL (ref 150–400)
RBC: 3.96 MIL/uL (ref 3.87–5.11)
RDW: 20.3 % — ABNORMAL HIGH (ref 11.5–15.5)
WBC: 7 10*3/uL (ref 4.0–10.5)

## 2015-02-14 LAB — TSH: TSH: 3.854 u[IU]/mL (ref 0.350–4.500)

## 2015-02-15 NOTE — Progress Notes (Signed)
Likely will need more Lasix, needs to be seen in CHF clinic.

## 2015-02-16 ENCOUNTER — Ambulatory Visit: Payer: Medicare Other | Admitting: Gastroenterology

## 2015-02-17 DIAGNOSIS — I5032 Chronic diastolic (congestive) heart failure: Secondary | ICD-10-CM | POA: Diagnosis not present

## 2015-02-17 DIAGNOSIS — M25571 Pain in right ankle and joints of right foot: Secondary | ICD-10-CM | POA: Diagnosis not present

## 2015-02-19 ENCOUNTER — Ambulatory Visit (HOSPITAL_COMMUNITY)
Admit: 2015-02-19 | Discharge: 2015-02-19 | Disposition: A | Discharge status: Home / Self Care | Payer: Medicare Other | Source: Ambulatory Visit | Attending: Cardiology | Admitting: Cardiology

## 2015-02-19 ENCOUNTER — Other Ambulatory Visit: Payer: Self-pay | Admitting: *Deleted

## 2015-02-19 ENCOUNTER — Encounter (HOSPITAL_COMMUNITY): Payer: Self-pay | Admitting: *Deleted

## 2015-02-19 VITALS — BP 100/58 | HR 101 | Wt 178.5 lb

## 2015-02-19 DIAGNOSIS — Z87891 Personal history of nicotine dependence: Secondary | ICD-10-CM | POA: Diagnosis not present

## 2015-02-19 DIAGNOSIS — R55 Syncope and collapse: Secondary | ICD-10-CM

## 2015-02-19 DIAGNOSIS — Z79899 Other long term (current) drug therapy: Secondary | ICD-10-CM | POA: Insufficient documentation

## 2015-02-19 DIAGNOSIS — K219 Gastro-esophageal reflux disease without esophagitis: Secondary | ICD-10-CM | POA: Insufficient documentation

## 2015-02-19 DIAGNOSIS — E039 Hypothyroidism, unspecified: Secondary | ICD-10-CM | POA: Insufficient documentation

## 2015-02-19 DIAGNOSIS — I5032 Chronic diastolic (congestive) heart failure: Secondary | ICD-10-CM | POA: Diagnosis not present

## 2015-02-19 DIAGNOSIS — Z794 Long term (current) use of insulin: Secondary | ICD-10-CM | POA: Diagnosis not present

## 2015-02-19 DIAGNOSIS — E119 Type 2 diabetes mellitus without complications: Secondary | ICD-10-CM | POA: Insufficient documentation

## 2015-02-19 DIAGNOSIS — Z8249 Family history of ischemic heart disease and other diseases of the circulatory system: Secondary | ICD-10-CM | POA: Diagnosis not present

## 2015-02-19 DIAGNOSIS — I482 Chronic atrial fibrillation, unspecified: Secondary | ICD-10-CM

## 2015-02-19 DIAGNOSIS — Z7902 Long term (current) use of antithrombotics/antiplatelets: Secondary | ICD-10-CM | POA: Insufficient documentation

## 2015-02-19 DIAGNOSIS — G4733 Obstructive sleep apnea (adult) (pediatric): Secondary | ICD-10-CM | POA: Insufficient documentation

## 2015-02-19 IMAGING — CR DG ELBOW COMPLETE 3+V*R*
4 series · 4 of 4 positions shown · non-contrast
Comparison: None.

CLINICAL DATA: Fell, pain

RIGHT ELBOW - COMPLETE 3+ VIEW

[view not recorded (1 of 4)]
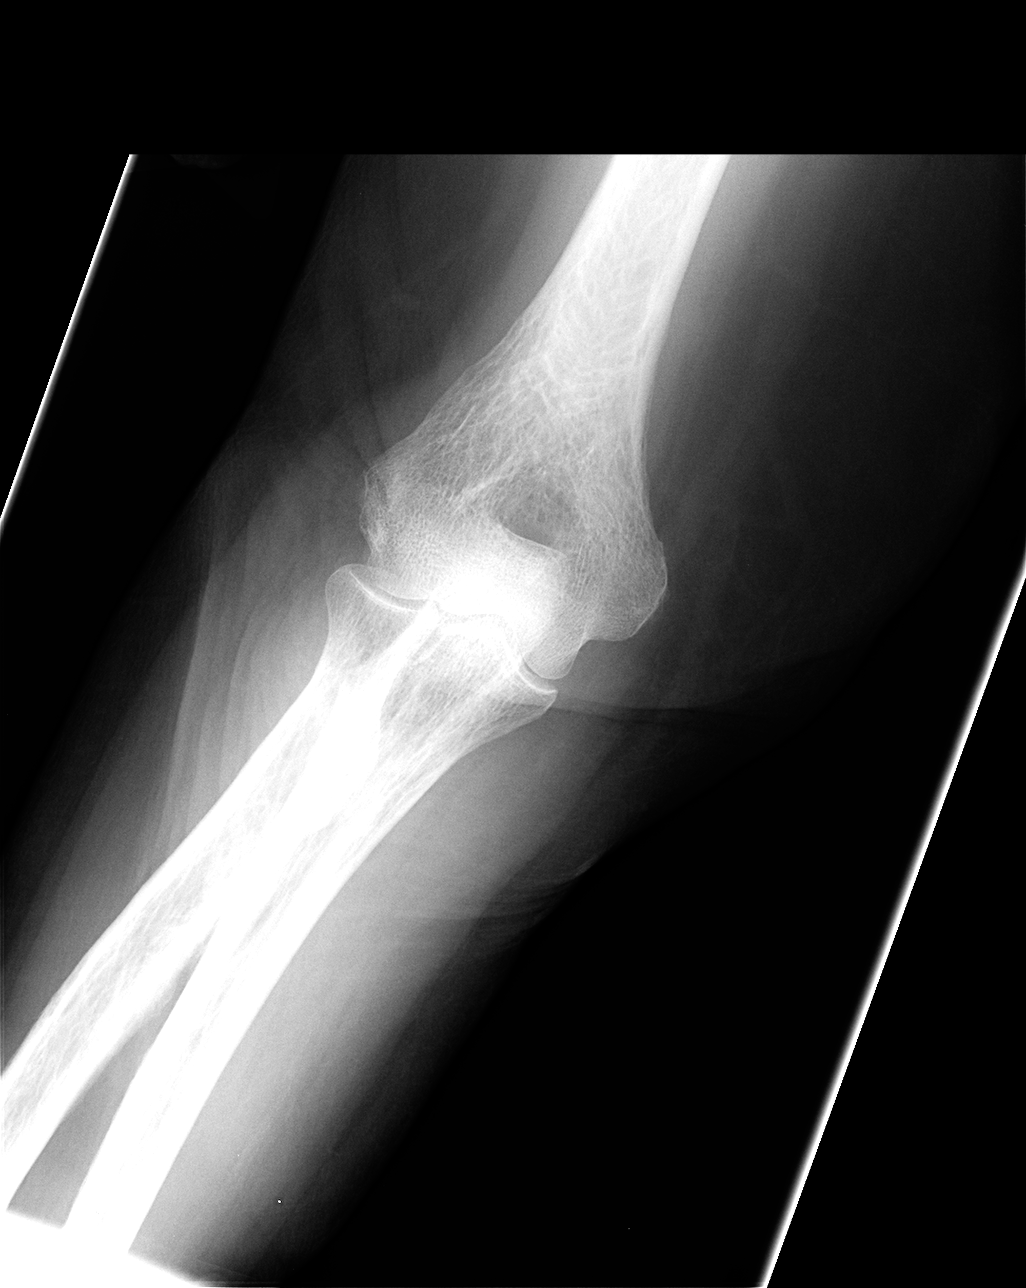

[view not recorded (2 of 4)]
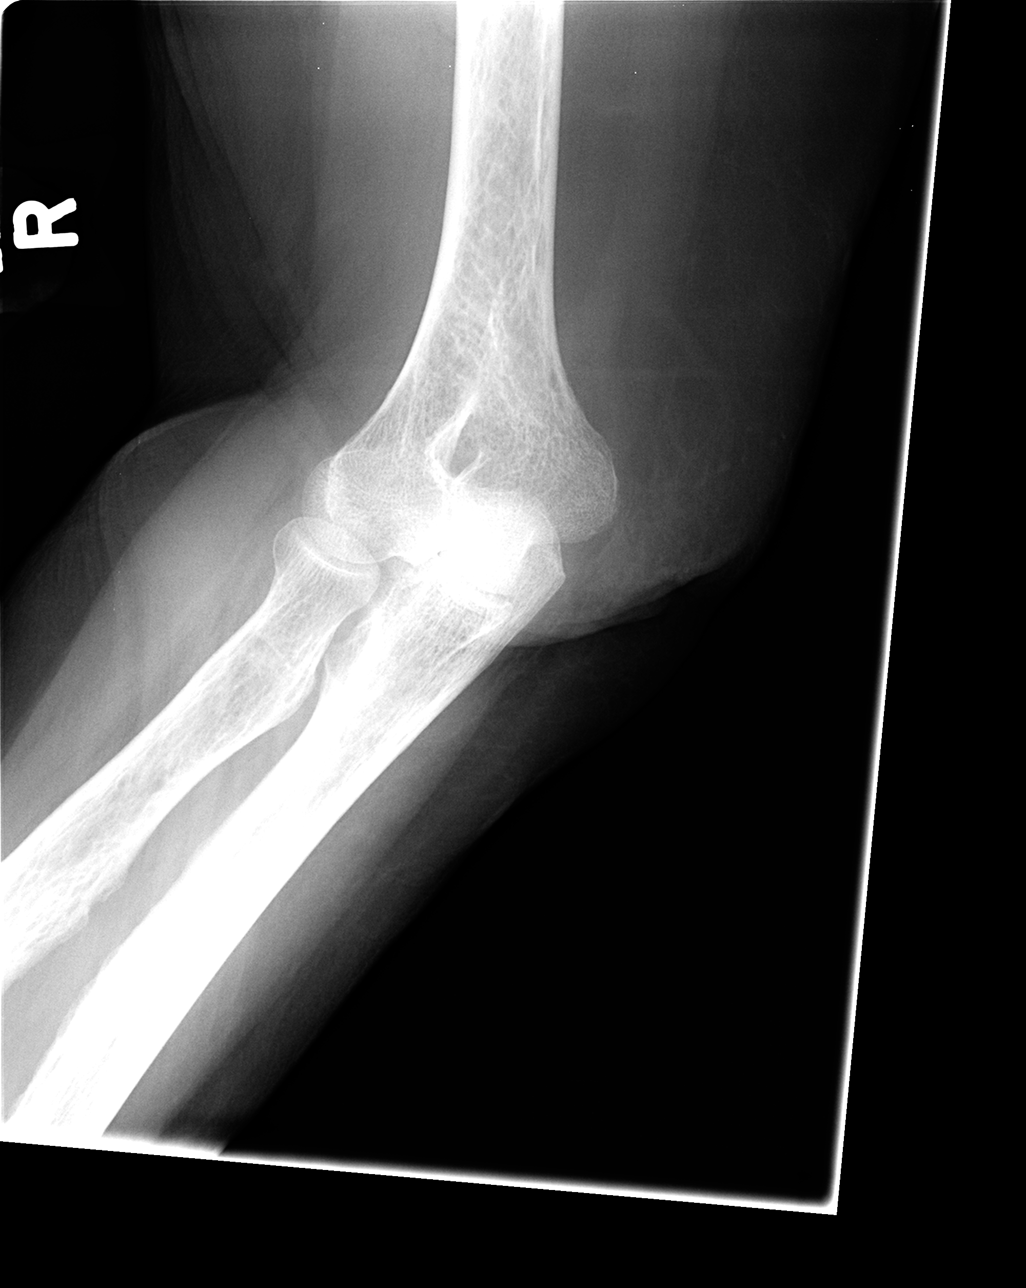

[view not recorded (3 of 4)]
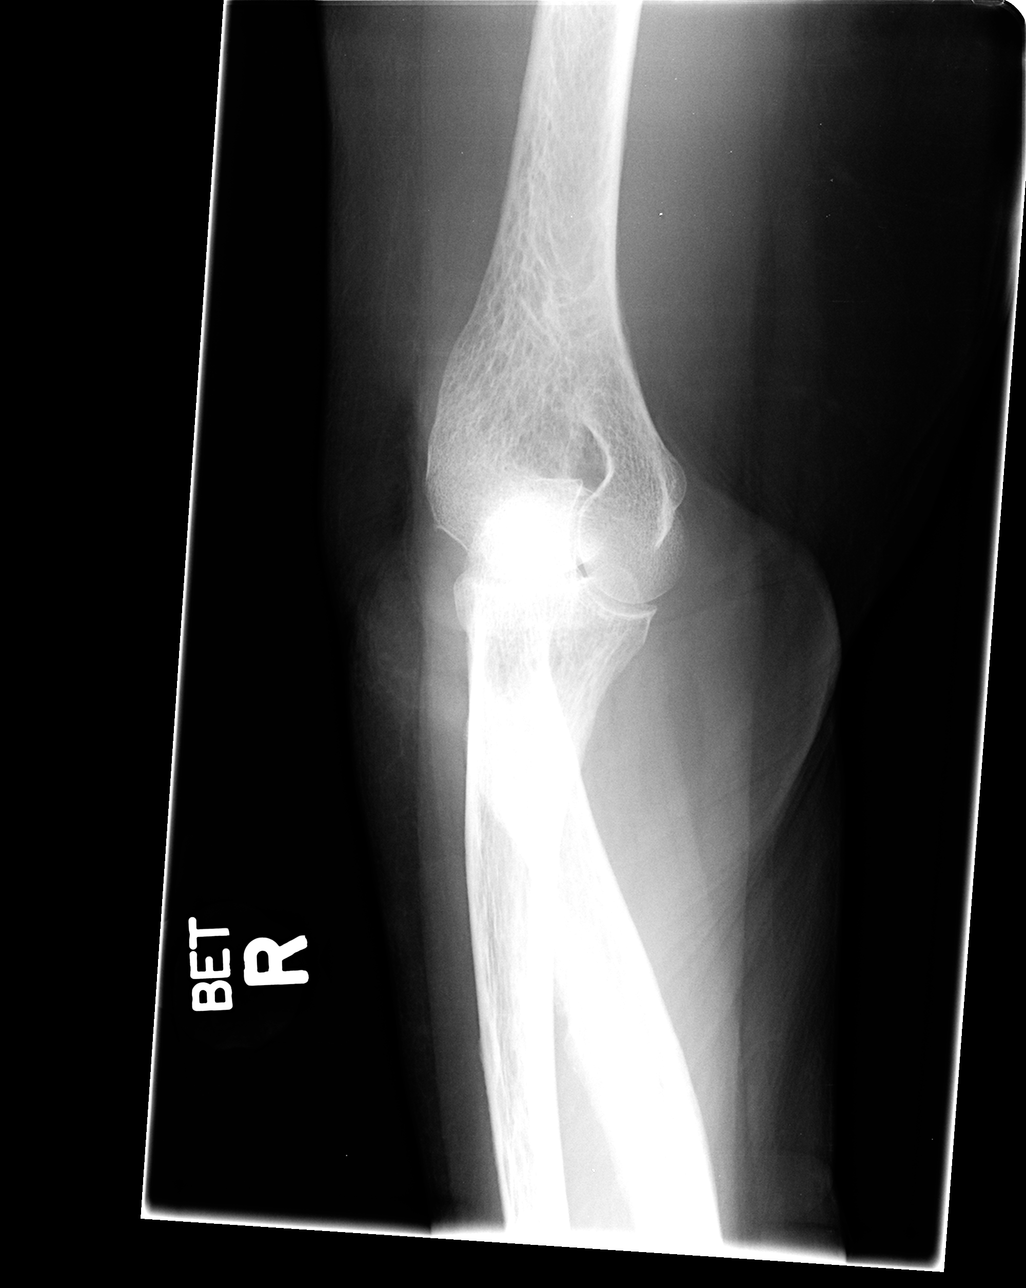

[view not recorded (4 of 4)]
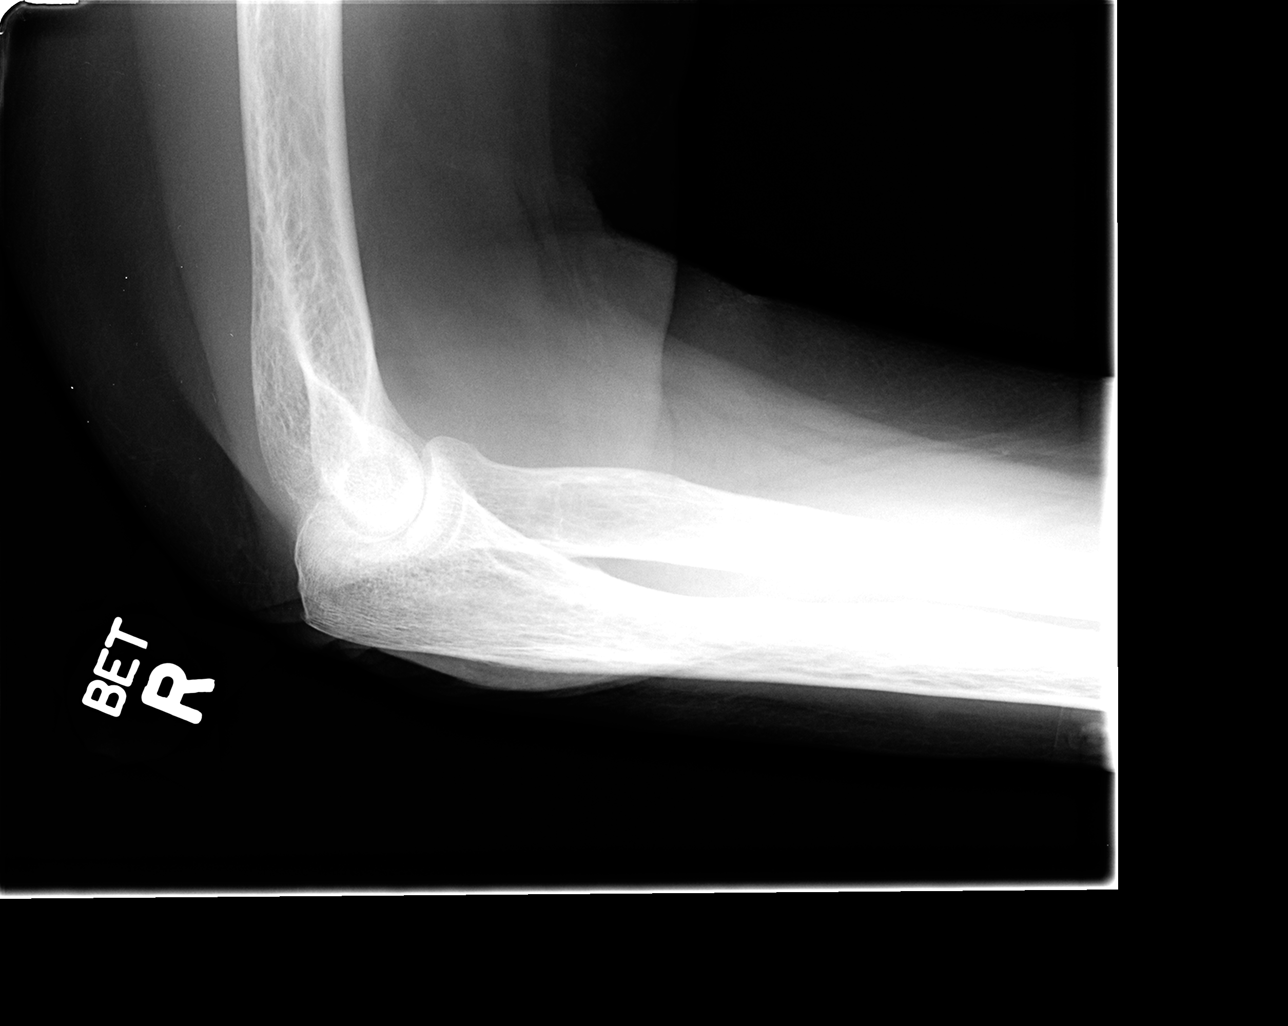

[4 of 4 positions shown; findings below may reference images not displayed]

FINDINGS: There is no evidence of fracture, dislocation, or joint
effusion.  There is no evidence of arthropathy or other focal bone
abnormality.  Soft tissues are unremarkable.
IMPRESSION: Negative.

## 2015-02-19 IMAGING — CT CT CERVICAL SPINE W/O CM
3 of 5 series · 9 of 33 positions shown, 11 images · non-contrast
Comparison: Most recent 07/15/2009.

CT HEAD

CLINICAL DATA: Fell yesterday, pain in the head and neck.

CT HEAD WITHOUT CONTRAST
CT CERVICAL SPINE WITHOUT CONTRAST
TECHNIQUE: Multidetector CT imaging of the head and cervical spine
was performed following the standard protocol without intravenous
contrast.  Multiplanar CT image reconstructions of the cervical
spine were also generated.

[Series 5: cervical st 2.0 b31s · axial · 0.29mm/px · z∈[+134,+134]mm · 1 of 81 slices shown, 2 images]
[im 49/81  soft-tissue]
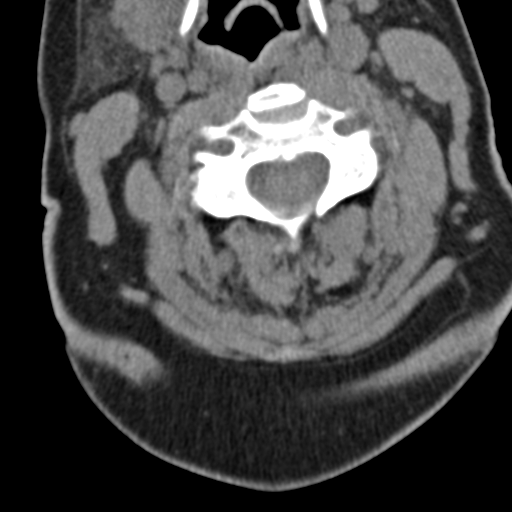
[im 49/81  bone]
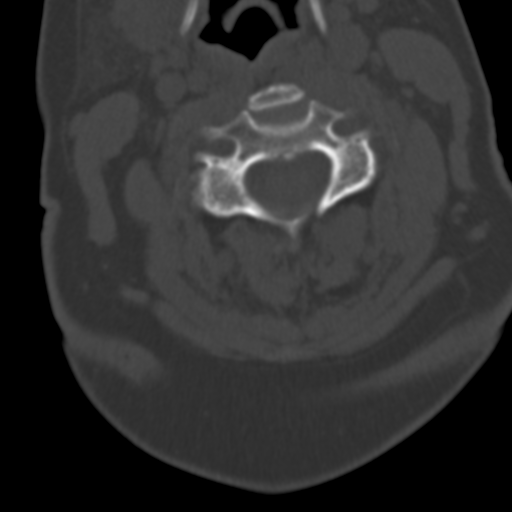

[Series 7: sagittal bone 2.0 · sagittal · 0.18mm/px · 5 of 54 slices shown, 6 images]
[im 18/54  bone]
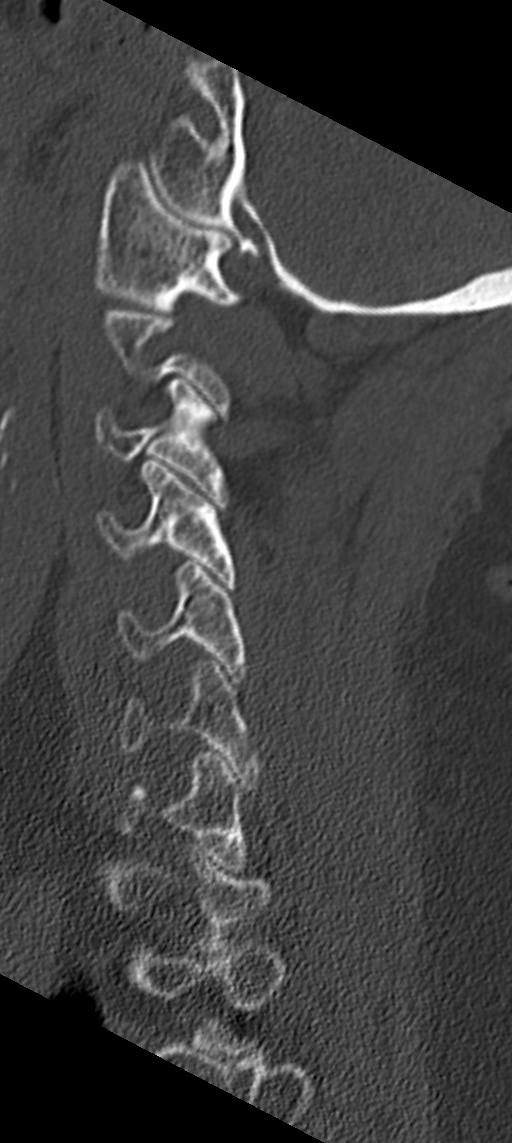
[im 23/54  bone]
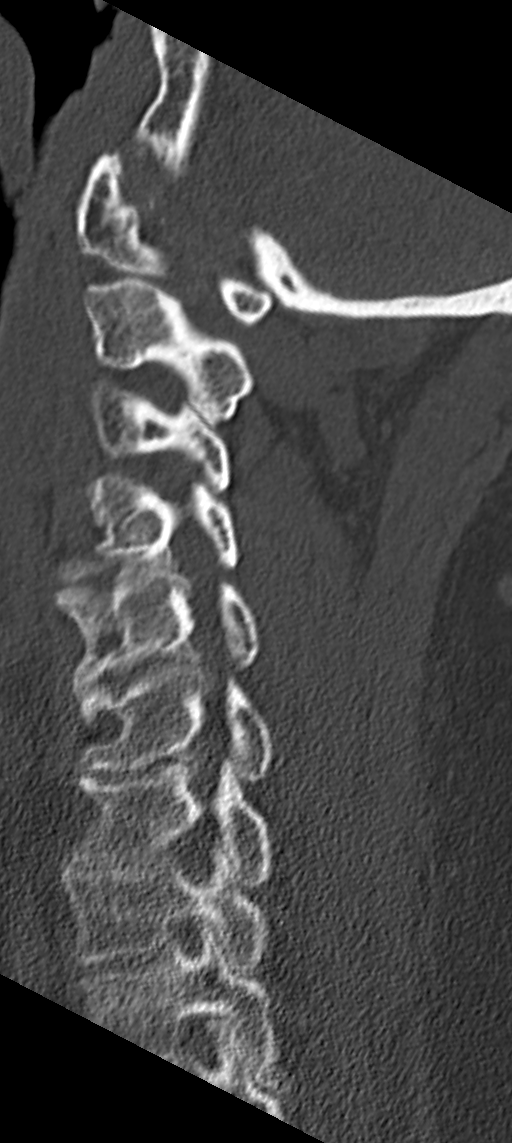
[im 27/54  soft-tissue]
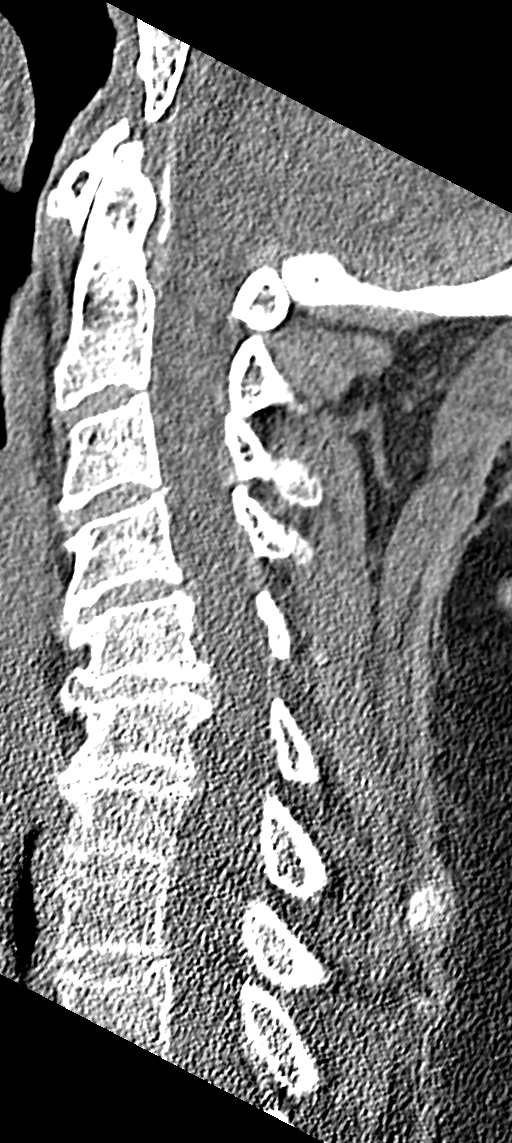
[im 27/54  bone]
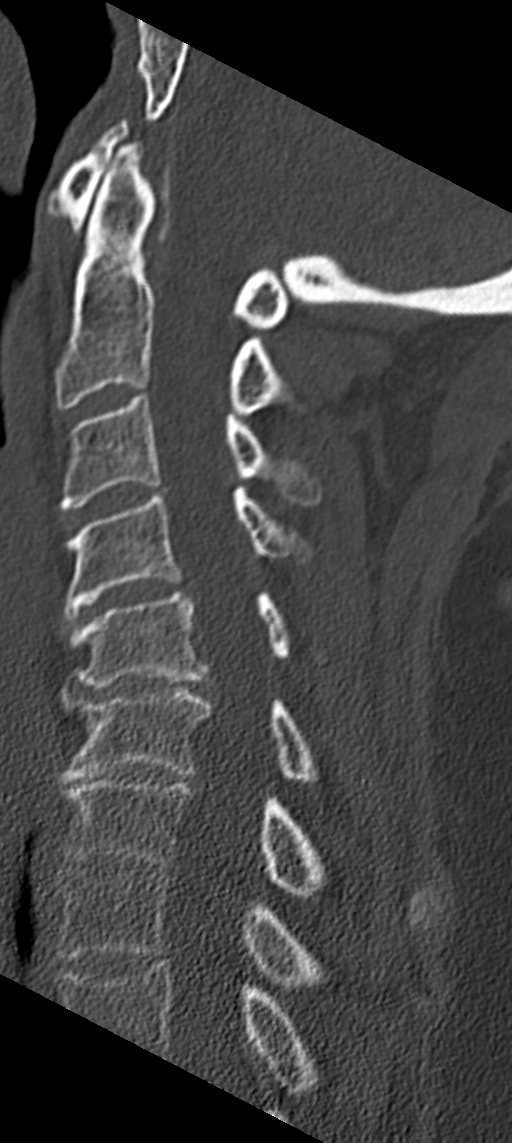
[im 31/54  bone]
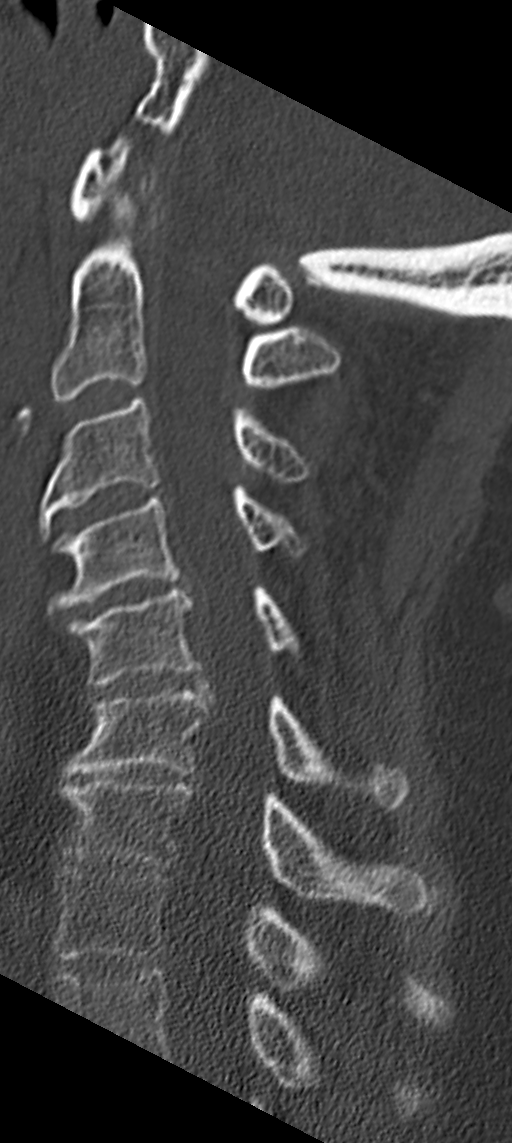
[im 36/54  bone]
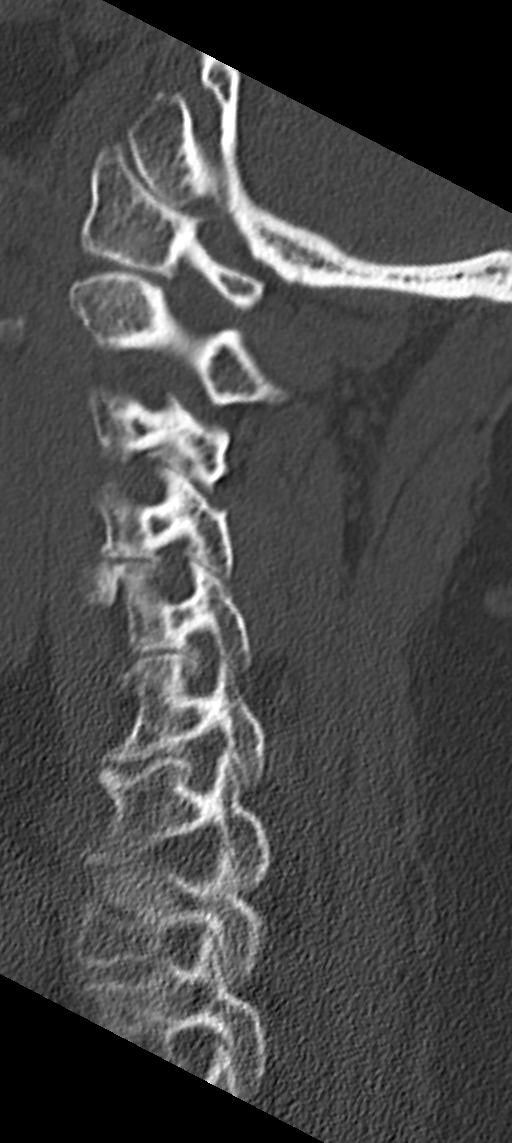

[Series 8: coronal bone 2.0 · coronal · 0.25mm/px · 3 of 51 slices shown]
[im 11/51  bone]
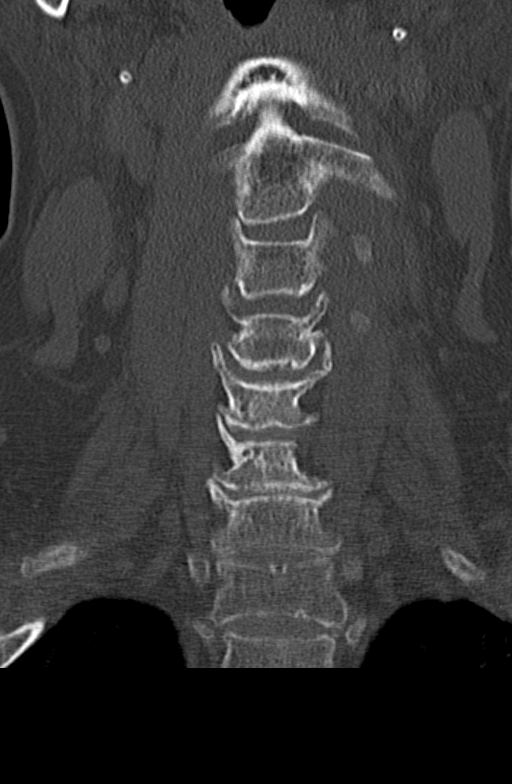
[im 21/51  bone]
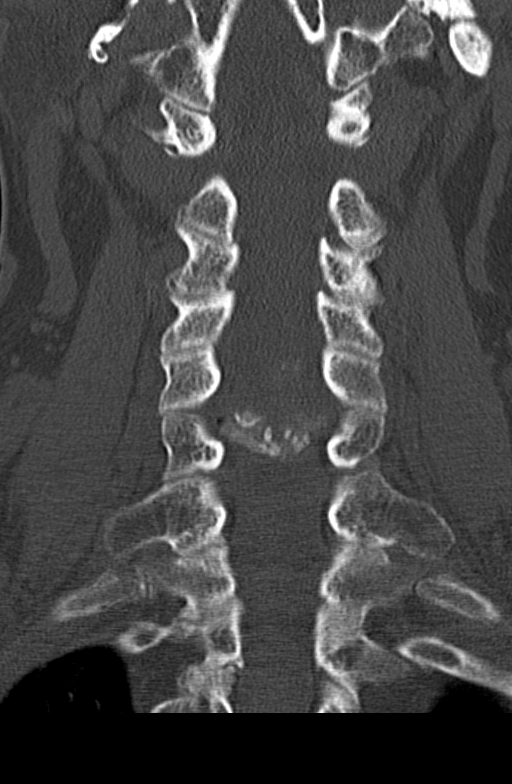
[im 31/51  bone]
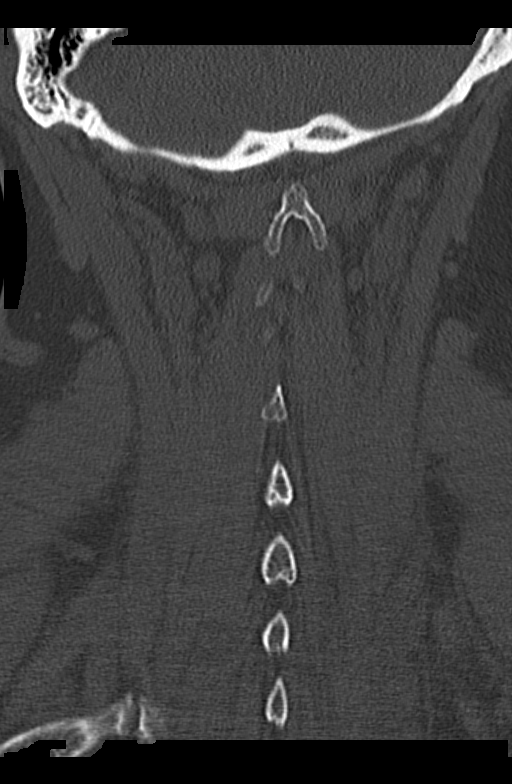

[9 of 33 positions shown; findings below may reference images not displayed]

FINDINGS: No acute stroke or hemorrhage.  Premature cerebral and
cerebellar atrophy.  Chronic microvascular ischemic change.  The
calvarium is intact.  Chronic opacity right division sphenoid
sinus, stable from priors.  Vascular calcification.  Dural
calcification.  No acute orbital or mastoid findings.
IMPRESSION: Chronic changes as described.  No acute intracranial process.

CT CERVICAL SPINE
FINDINGS: Reversal of the normal cervical lordosis.  Advanced disc
space narrowing C5-6 and C6-7.  No cervical spine fracture or
prevertebral soft tissue swelling.  Mild stenosis at C5-6 with
bilateral foraminal narrowing.  No evidence for pneumothorax.
IMPRESSION: Spondylosis most pronounced at C5-6 and C6-7 is similar to priors.
No acute cervical spine fracture.

## 2015-02-19 IMAGING — CR DG CHEST 1V
1 series · 1 of 1 positions shown · non-contrast
Comparison: 09/14/2011.

CLINICAL DATA: Fell getting out of bed, pain

CHEST - 1 VIEW

[view not recorded]
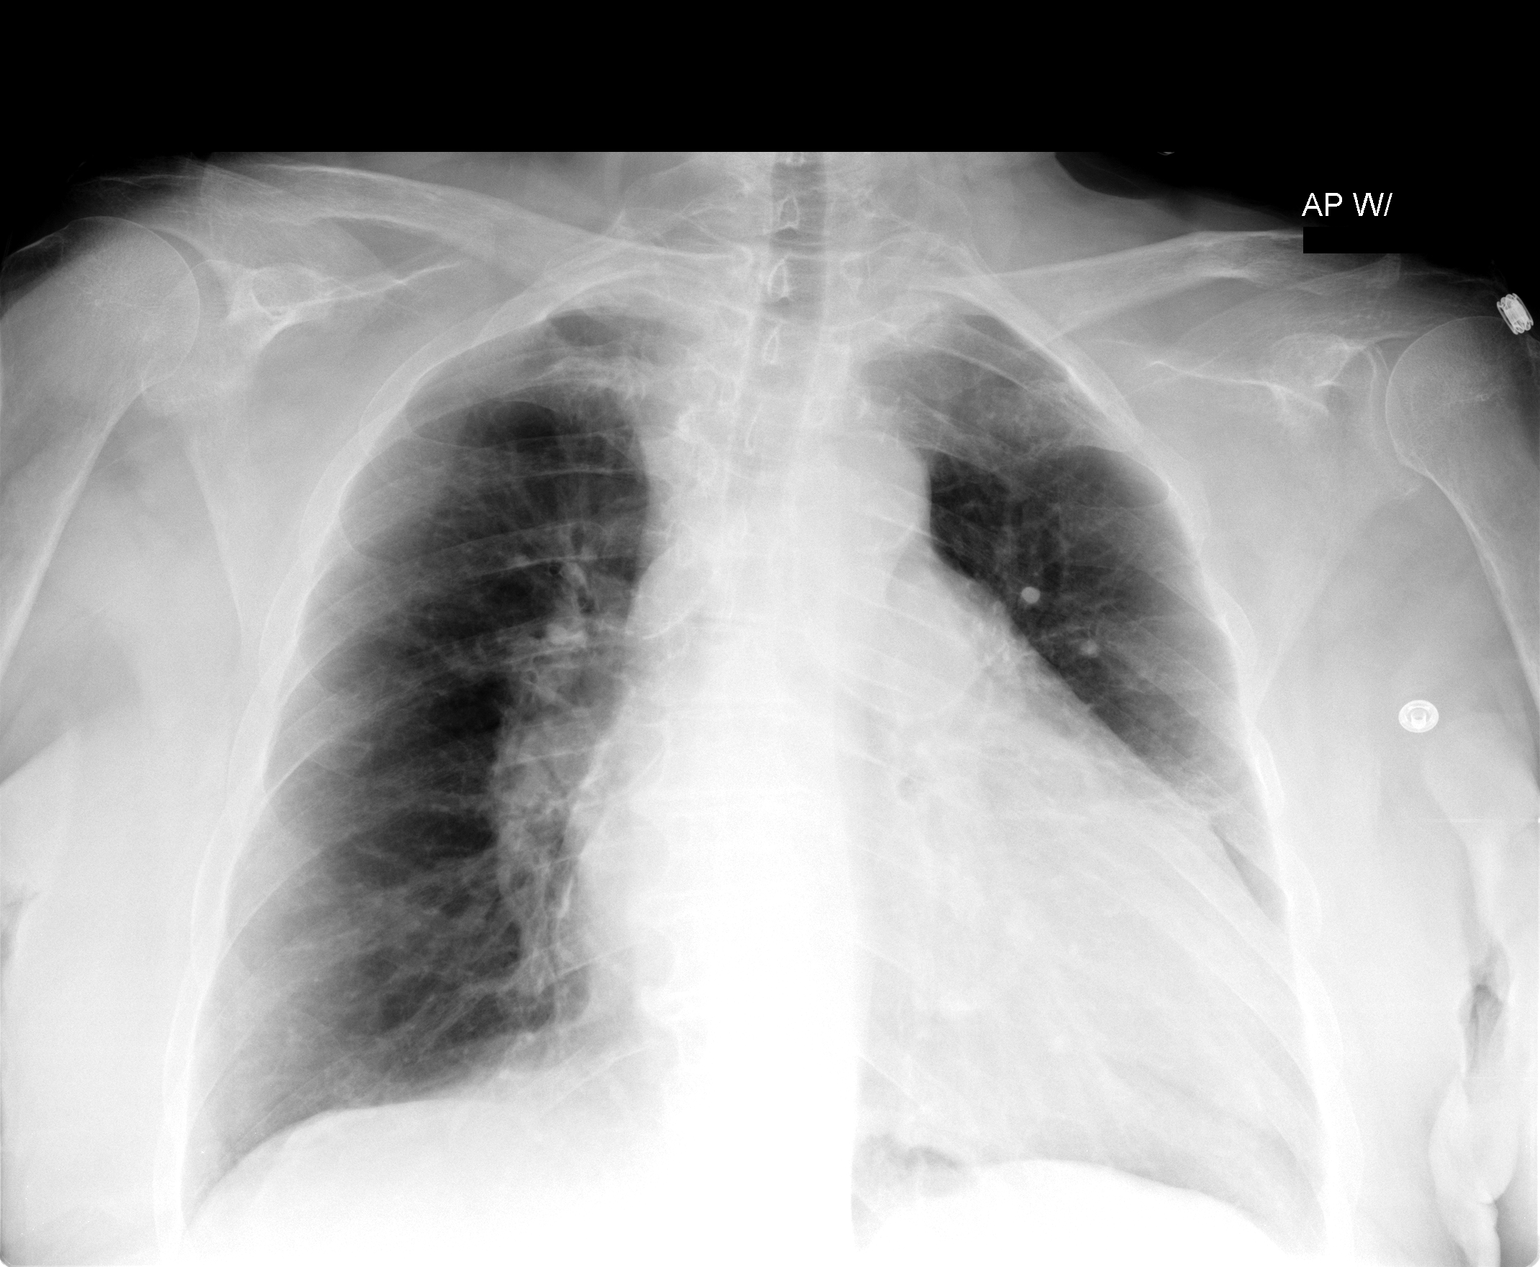

[1 of 1 positions shown; findings below may reference images not displayed]

FINDINGS: The heart is enlarged.  The lung fields are clear. There
is no rib fracture or pneumothorax.  Bilateral cervical ribs are
seen.  Calcified tortuous aorta is noted.  Prominent central
pulmonary vascular markings, possible pulmonary venous hypertension
or pulmonary arterial enlargement.
IMPRESSION: Cardiomegaly without edema.  Stable chest.

## 2015-02-19 IMAGING — CR DG WRIST COMPLETE 3+V*L*
2 series · 2 of 2 positions shown · non-contrast
Comparison: None.

CLINICAL DATA: Fell, pain

LEFT WRIST - COMPLETE 3+ VIEW

[view not recorded (1 of 2)]
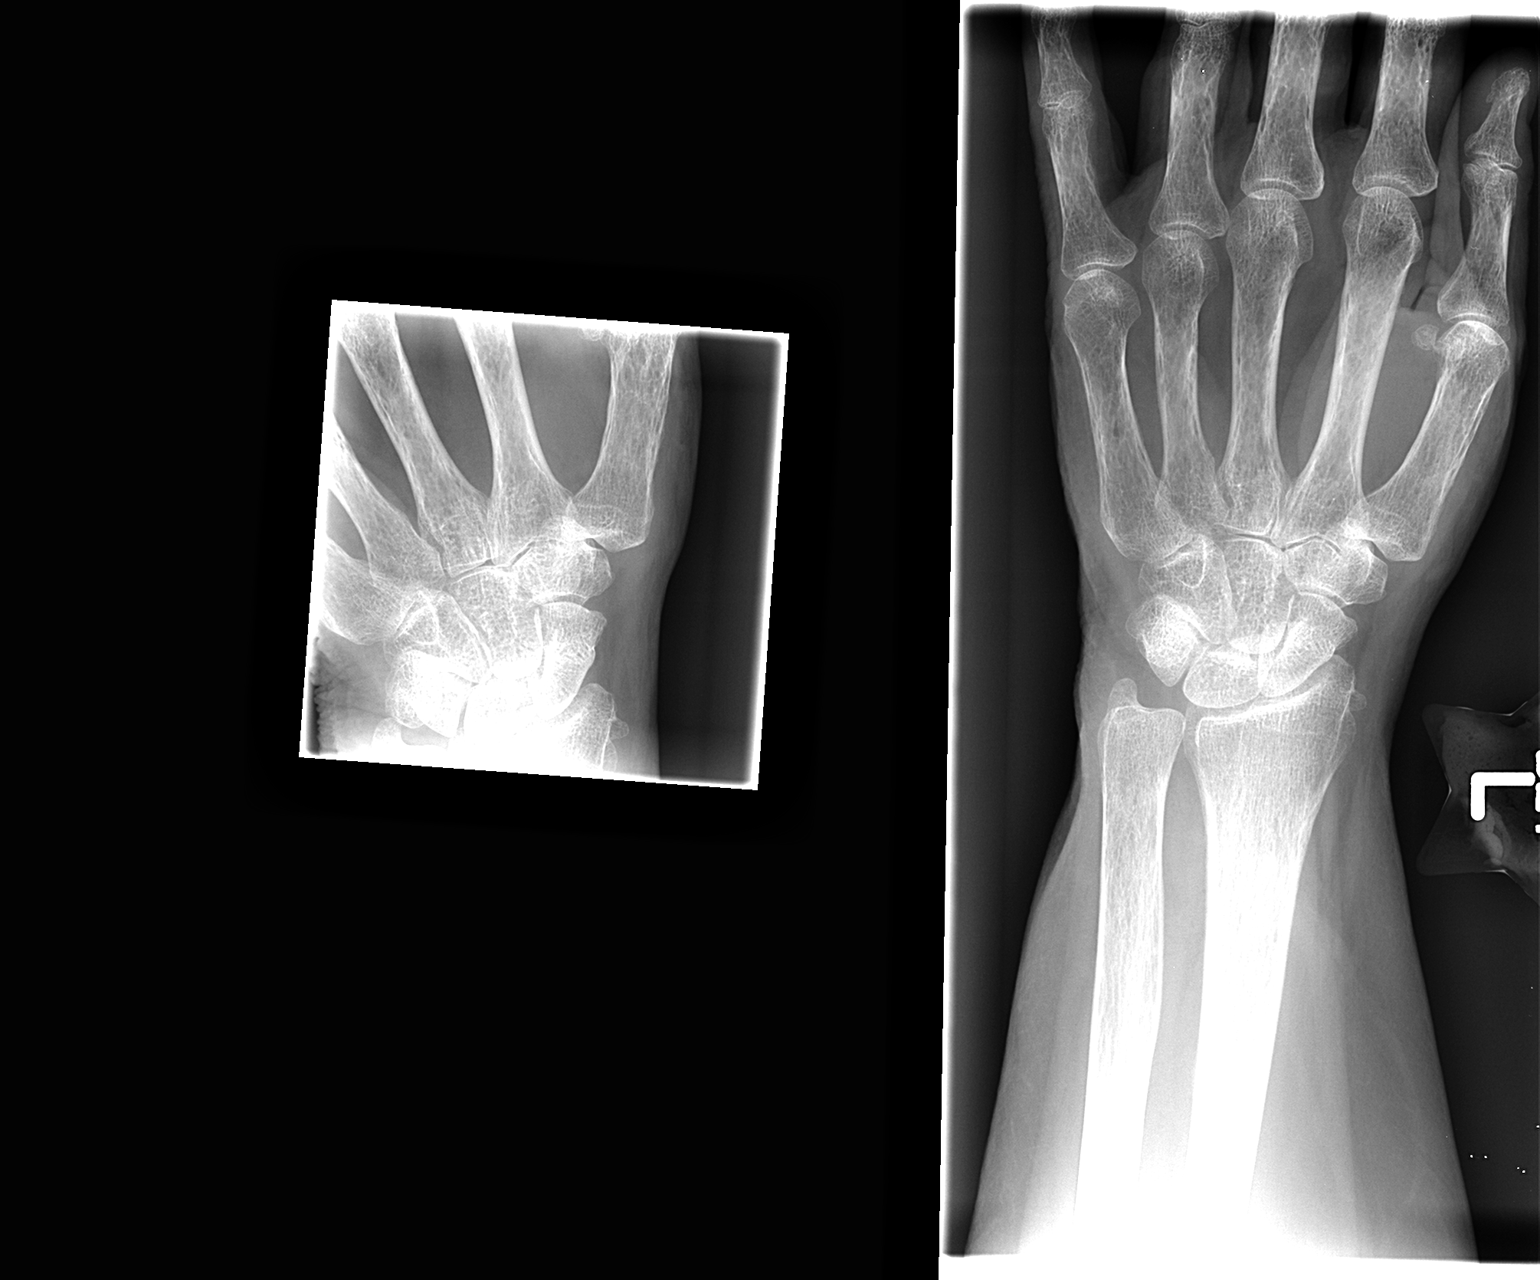

[view not recorded (2 of 2)]
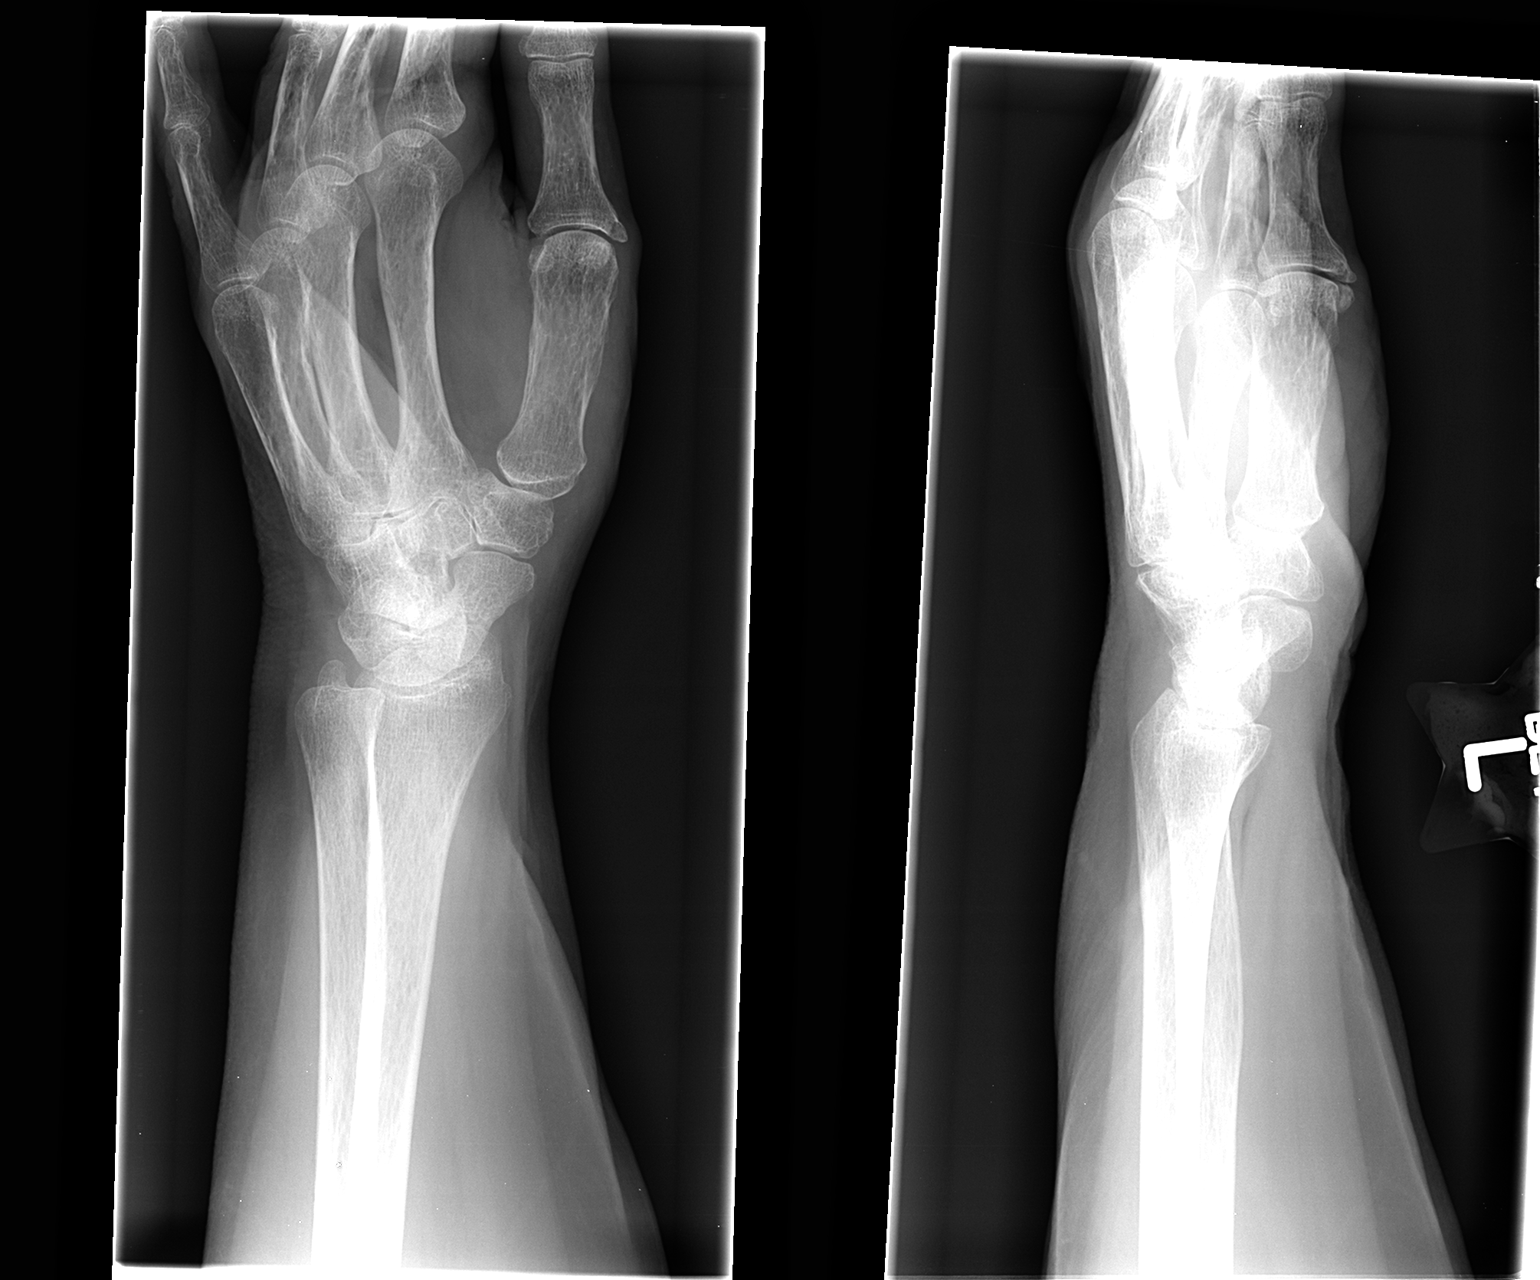

[2 of 2 positions shown; findings below may reference images not displayed]

FINDINGS: There is no evidence of fracture or dislocation.  There
is no evidence of arthropathy or other focal bony abnormality.
Soft tissues are unremarkable.
IMPRESSION: No visible fracture.

## 2015-02-19 IMAGING — CR DG KNEE COMPLETE 4+V*R*
4 series · 4 of 4 positions shown · non-contrast
Comparison: None.

CLINICAL DATA: Fell getting out of bed.

RIGHT KNEE - COMPLETE 4+ VIEW

[view not recorded (1 of 4)]
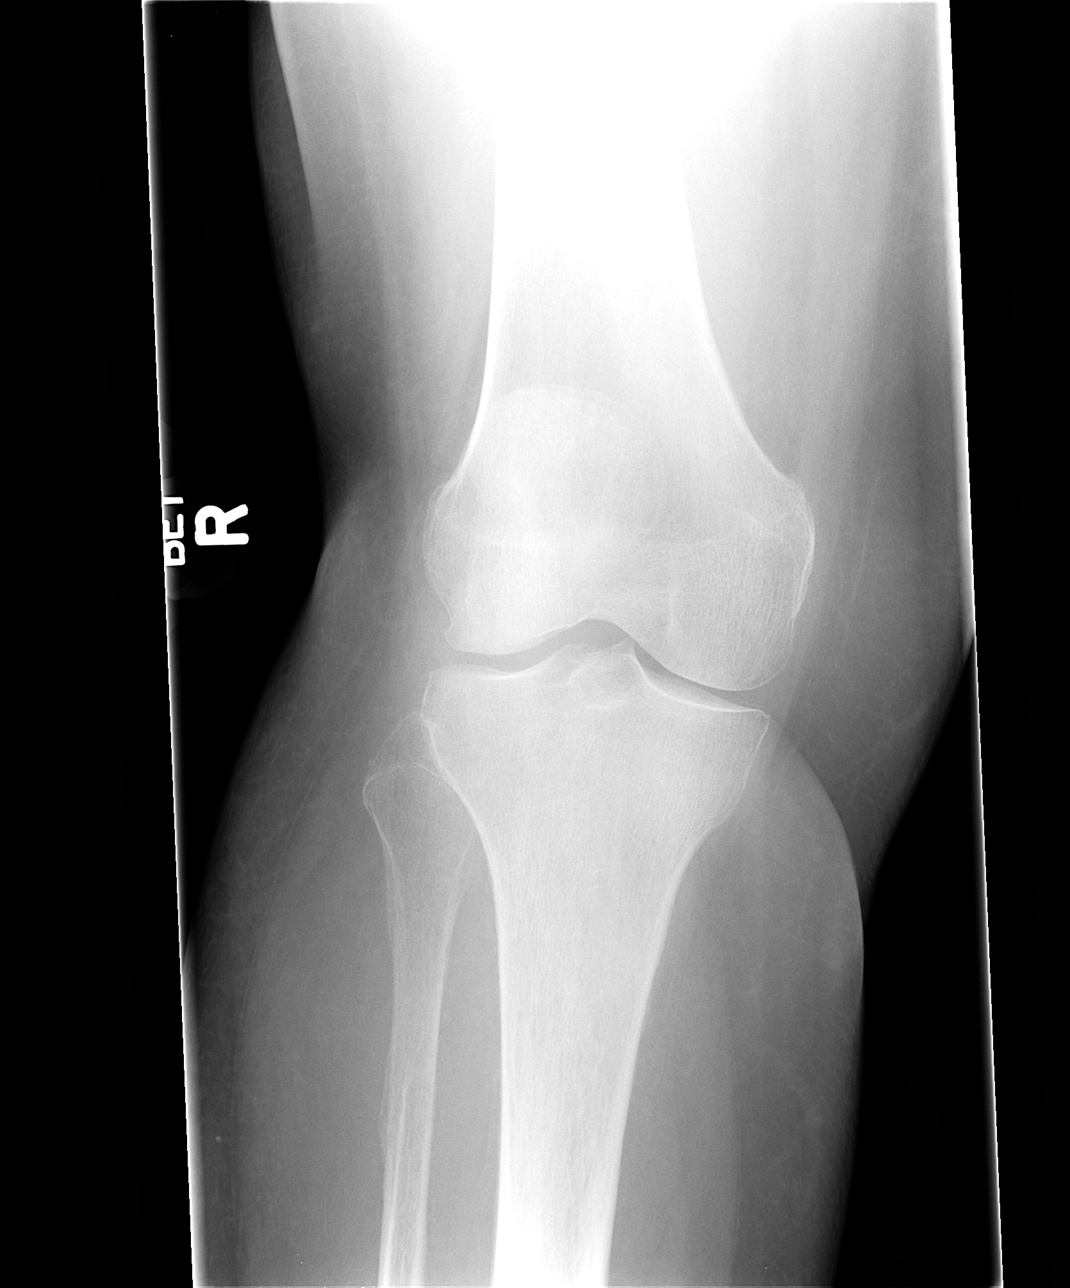

[view not recorded (2 of 4)]
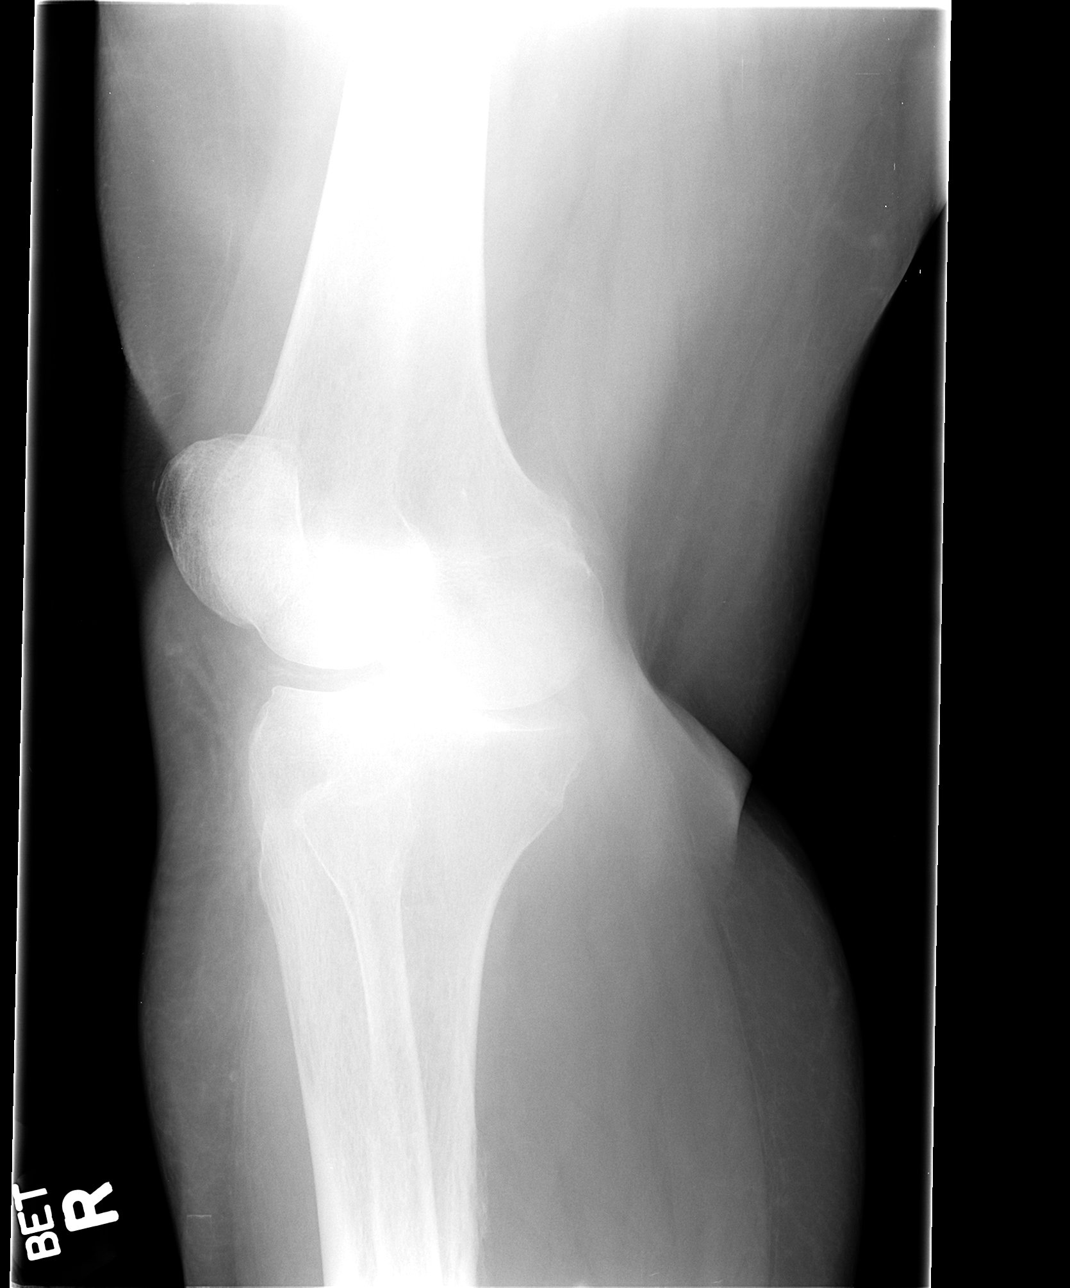

[view not recorded (3 of 4)]
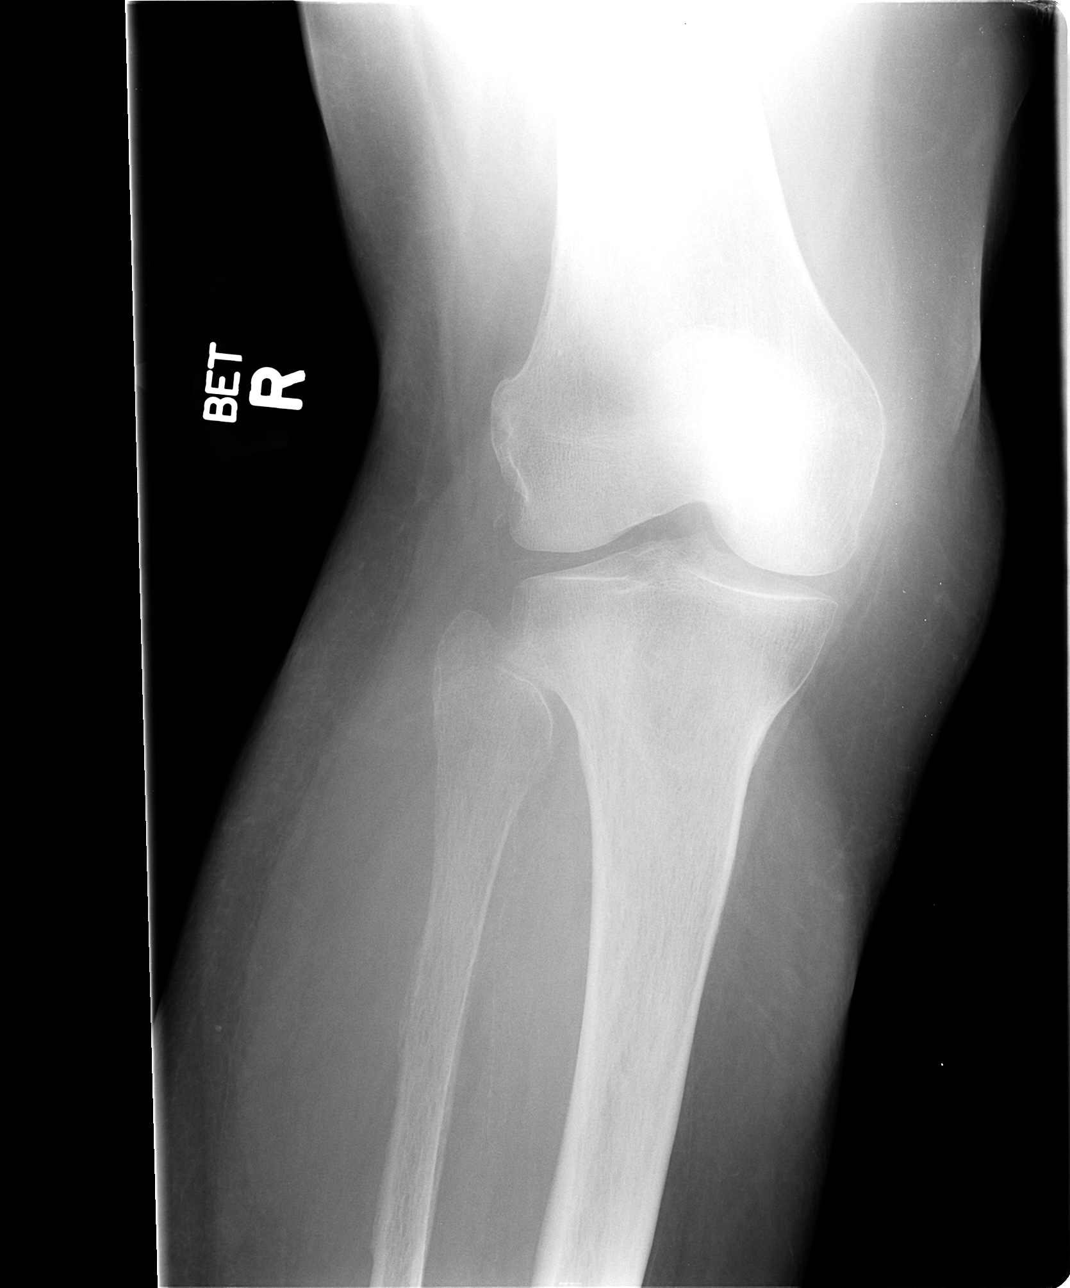

[view not recorded (4 of 4)]
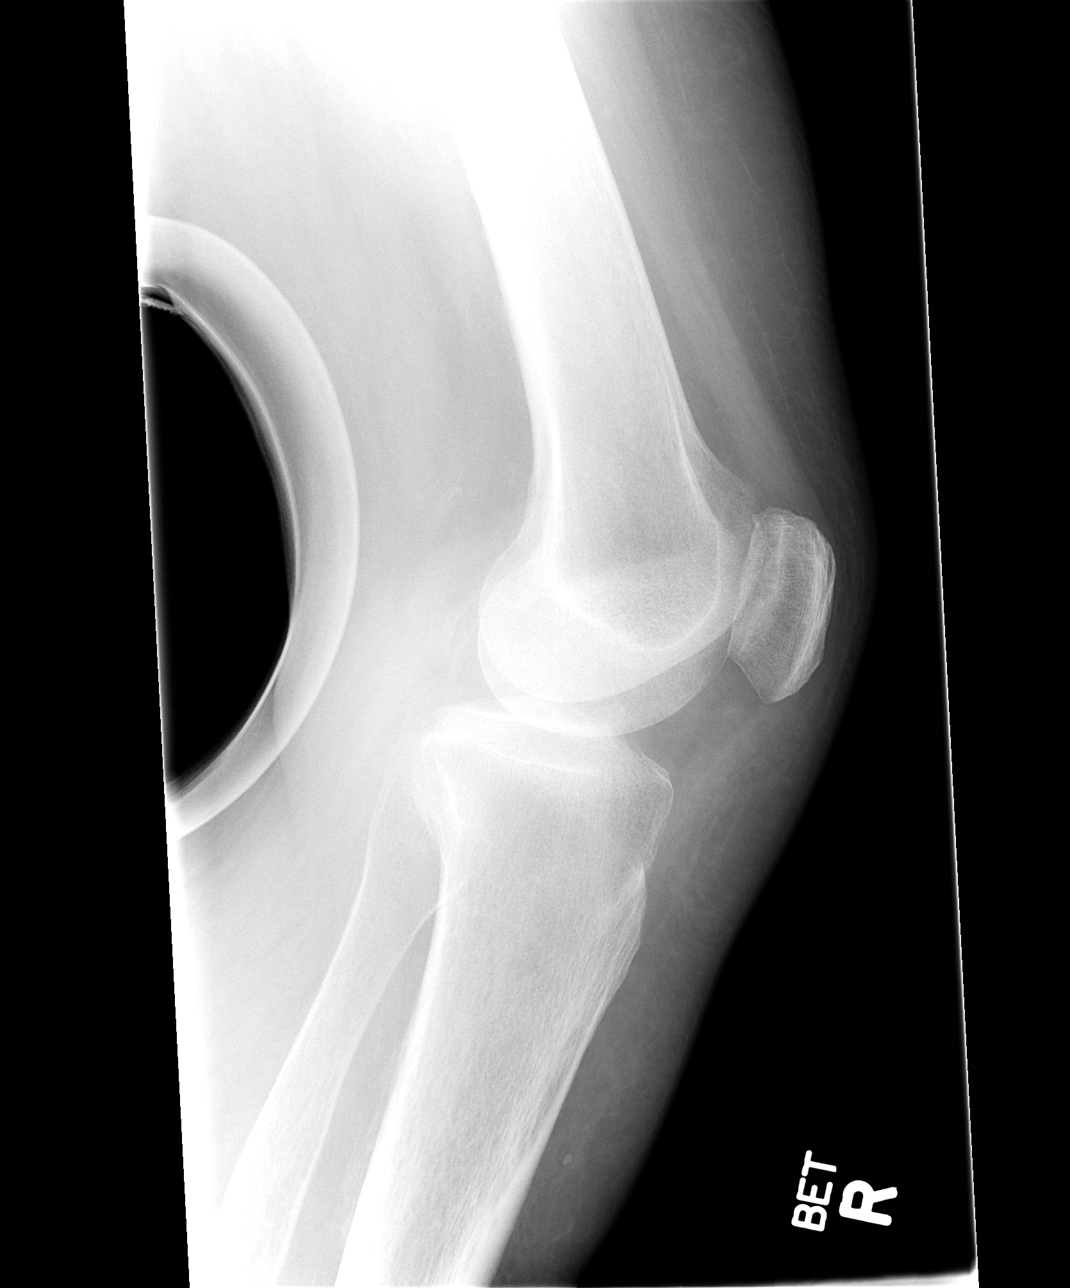

[4 of 4 positions shown; findings below may reference images not displayed]

FINDINGS: There is no evidence of fracture or dislocation.  There
is no evidence of arthropathy or other focal bony abnormality.
Soft tissues are unremarkable.
IMPRESSION: No acute findings.

## 2015-02-19 IMAGING — CR DG WRIST COMPLETE 3+V*R*
2 series · 2 of 2 positions shown · non-contrast
Comparison: None.

CLINICAL DATA: Fell, pain

RIGHT WRIST - COMPLETE 3+ VIEW

[view not recorded (1 of 2)]
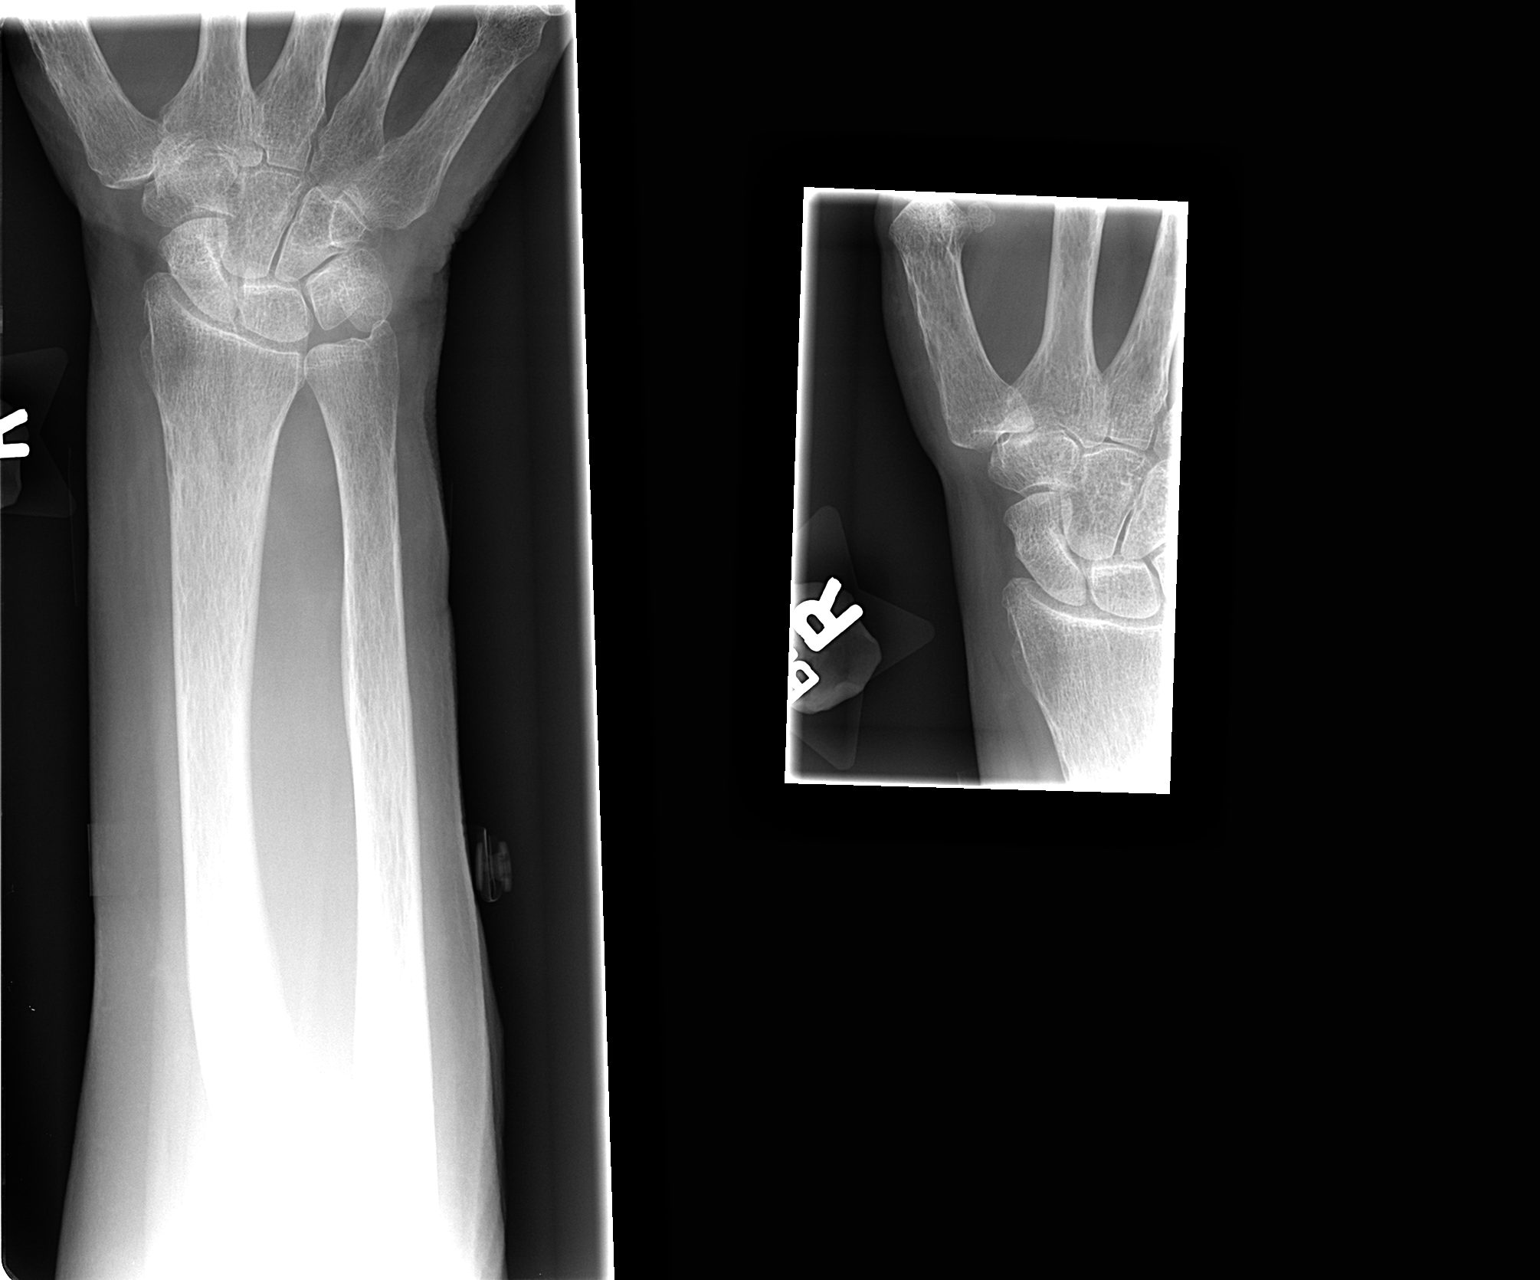

[view not recorded (2 of 2)]
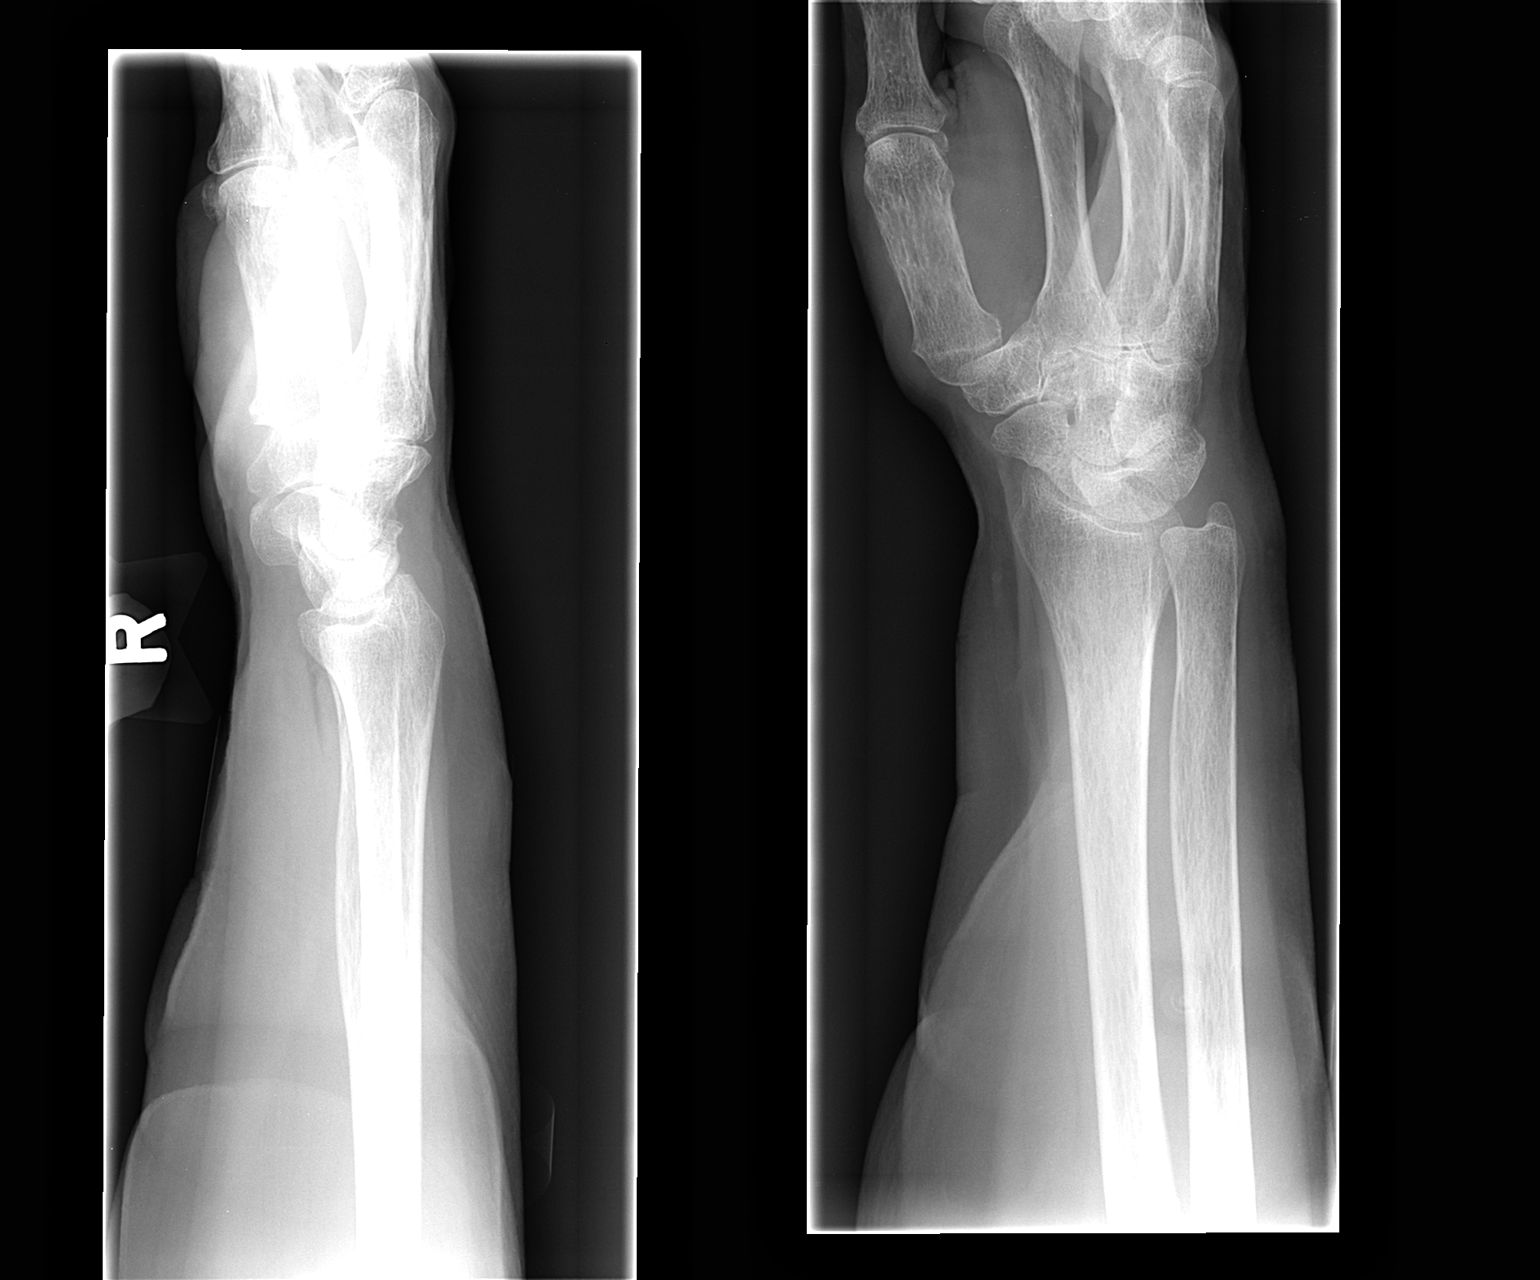

[2 of 2 positions shown; findings below may reference images not displayed]

FINDINGS: There is no evidence of fracture or dislocation.  There
is no evidence of arthropathy or other focal bony abnormality.
Soft tissues are unremarkable.
IMPRESSION: No acute findings.

## 2015-02-19 MED ORDER — FUROSEMIDE 40 MG PO TABS: ORAL_TABLET | ORAL | Status: DC

## 2015-02-19 NOTE — Patient Instructions (Signed)
Increase Furosemide (Lasix) to 40 mg in AM and 20 mg (1/2 tab) in PM  Your physician has recommended that you have a Cardioversion (DCCV). Electrical Cardioversion uses a jolt of electricity to your heart either through paddles or wired patches attached to your chest. This is a controlled, usually prescheduled, procedure. Defibrillation is done under light anesthesia in the hospital, and you usually go home the day of the procedure. This is done to get your heart back into a normal rhythm. You are not awake for the procedure. Please see the instruction sheet given to you today.  Your physician recommends that you schedule a follow-up appointment in: 3-4 weeks

## 2015-02-20 DIAGNOSIS — I5032 Chronic diastolic (congestive) heart failure: Secondary | ICD-10-CM | POA: Insufficient documentation

## 2015-02-20 DIAGNOSIS — M25571 Pain in right ankle and joints of right foot: Secondary | ICD-10-CM | POA: Diagnosis not present

## 2015-02-20 NOTE — Progress Notes (Signed)
Patient ID: [name], female   DOB: 09-25-1950, 64 y.o.   MRN: [id number] PCP: Dr. [name] Primary cardiologist: Dr Wyline Mood  64 yo with history of chronic atrial fibrillation and chronic diastolic CHF presents for cardiology followup.  She had developed progressive dyspnea and volume overload/weight gain over several months.  I brought her in for RHC in 7/16 showing markedly elevated right and left heart filling pressures and primarily pulmonary venous hypertension.  I admitted her and diuresed weight with weight falling significantly.  Echo showed preserved LV systolic function.  She was sent home on Lasix 60 mg bid.  She was re-admitted after standing up rapidly and passing out.  She sprained her right ankle.  Lasix was cut back to 40 mg daily and lisinopril and Coreg were stopped.    She is no longer lightheaded with standing, but weight is starting to creep back up again (3-4 lbs in the last week).  She does not have significant dyspnea but has not been walking much with sprained ankle.  No orthopnea/PND.  No chest pain.  Increasing edema and abdominal distention.    ECG (8/16): Atrial fibrillation with LAFB  Labs (8/16): K 4.7, creatinine 0.7, LFTs normal, TSH normal, HCT 32.2  PMH: 1. Chronic atrial fibrillation 2. Chronic diastolic CHF: EF was previously down to 40%, but most recent echo in 7/16 showed EF 55-60%, moderate LVH, mild MR, severe biatrial enlargement, PA systolic pressure 46 mmHg.  RHC (7/16) with mean RA 23, PA 70/36 mean 52, mean PCWP 31, CI 2.32, PVR 4.6 WU.  3. GERD with Barretts esophagus 4. Hypothyroidism 5. OSA on CPAP 6. Type II diabetes 7. IBS  SH: Married, retired (used to work at [employer]).  Daughter brings to appts.  Lives in [place].  Prior smoker, quit 1984.  FH: Mother with MI  ROS: All systems reviewed and negative except as per HPI.   Current Outpatient Prescriptions  Medication Sig Dispense Refill  . amiodarone (PACERONE) 200 MG tablet Take 1  tablet (200 mg total) by mouth 2 (two) times daily. 60 tablet 6  . dicyclomine (BENTYL) 10 MG capsule 1 po 30 mins before meals up to three times a day or q4h prn for abdominal pain/diarrhea. NO MORE 8 DOSES IN A DAY. (Patient taking differently: Take 10 mg by mouth daily as needed. 1 po 30 mins before meals up to three times a day or q4h prn for abdominal pain/diarrhea. NO MORE 8 DOSES IN A DAY.) 45 capsule 11  . dronabinol (MARINOL) 2.5 MG capsule Take 1 capsule (2.5 mg total) by mouth 2 (two) times daily before a meal. 60 capsule 0  . DULoxetine (CYMBALTA) 30 MG capsule Take 30 mg by mouth 2 (two) times daily.    Marland Kitchen ELIQUIS 5 MG TABS tablet TAKE 1 TABLET BY MOUTH TWICE DAILY. 60 tablet 3  . furosemide (LASIX) 40 MG tablet Take 40 mg in AM and 20 mg in PM 30 tablet 1  . glipiZIDE (GLUCOTROL) 10 MG tablet Take 10 mg by mouth daily before breakfast.     . Insulin Glargine (LANTUS SOLOSTAR) 100 UNIT/ML Solostar Pen Inject 20-50 Units into the skin at bedtime as needed. Patient takes 20 units every night at bedtime if over 178.  Takes 50 units if her blood sugar is above 300.    . levETIRAcetam (KEPPRA) 250 MG tablet Take 1 tablet (250 mg total) by mouth 2 (two) times daily. 60 tablet 11  . levothyroxine (SYNTHROID, LEVOTHROID) 137  MCG tablet Take 137 mcg by mouth daily.    Marland Kitchen loperamide (IMODIUM) 2 MG capsule TAKE (1) CAPSULE THREE TIMES DAILY AS NEEDED FOR DIARRHEA OR LOOSE STOOLS. 30 capsule 1  . LORazepam (ATIVAN) 1 MG tablet Take 1 mg by mouth at bedtime. For anxiety    . magnesium oxide (MAG-OX) 400 MG tablet Take 400 mg by mouth 2 (two) times daily.    . metaxalone (SKELAXIN) 800 MG tablet Take 800 mg by mouth 2 (two) times daily.     . metFORMIN (GLUCOPHAGE) 500 MG tablet Take 1,000 mg by mouth 2 (two) times daily with a meal.     . Multiple Vitamin (MULTIVITAMIN WITH MINERALS) TABS tablet Take 1 tablet by mouth daily.    . nitroGLYCERIN (NITROSTAT) 0.4 MG SL tablet Place 0.4 mg under the tongue  every 5 (five) minutes as needed for chest pain. For chest pain     . omeprazole (PRILOSEC) 20 MG capsule Take 20 mg by mouth 2 (two) times daily.      . ondansetron (ZOFRAN) 4 MG tablet Take 4 mg by mouth every 6 (six) hours as needed. For nausea    . POLY-IRON 150 150 MG capsule TAKE 1 CAPSULE BY MOUTH 2 TIMES DAILY. 60 capsule 3  . potassium chloride (K-DUR,KLOR-CON) 10 MEQ tablet Take 20 mEq by mouth 2 (two) times daily.    . vitamin B-12 (CYANOCOBALAMIN) 500 MCG tablet Take 500 mcg by mouth daily.     No current facility-administered medications for this encounter.   BP 100/58 mmHg  Pulse 101  Wt 178 lb 8 oz (80.967 kg)  SpO2 99% General: NAD Neck: JVP 8 cm, no thyromegaly or thyroid nodule.  Lungs: Clear to auscultation bilaterally with normal respiratory effort. CV: Nondisplaced PMI.  Heart irregular S1/S2, no S3/S4, no murmur.  1+ edema to knees bilaterally, R>L.  No carotid bruit.  Normal pedal pulses.  Abdomen: Soft, nontender, no hepatosplenomegaly, mild distention.  Skin: Intact without lesions or rashes.  Neurologic: Alert and oriented x 3.  Psych: Normal affect. Extremities: No clubbing or cyanosis.  HEENT: Normal.   Assessment/Plan: 1. Chronic diastolic CHF: Significant CHF in the setting of long-standing atrial fibrillation.  She is re-accumulating volume now after Lasix was decreased.  - Increase Lasix to 40 qam/20 qpm.  I suspect we may need to go higher, but will be careful given orthostatic symptoms recently.  - BMET in 2 wks.  2. Atrial fibrillation: Chronic.  I suspect that this contributes to CHF.  I would like to give her a trial of DCCV.  We have been loading amiodarone.  She has been on Eliquis without missing doses.  I will bring her back in in 2 wks to try DCCV.  If this fails, can stop amiodarone.  TSH/LFTs normal recently.  Will need regular eye exams and baseline PFTs if she remains on amiodarone.  3. Orthostatic symptoms: Resolved off ACEI and Coreg.   Given normal EF, these are not vital meds, will leave off.   Marca Ancona 02/20/2015 7:11 AM

## 2015-02-23 DIAGNOSIS — I5032 Chronic diastolic (congestive) heart failure: Secondary | ICD-10-CM | POA: Diagnosis not present

## 2015-02-23 DIAGNOSIS — I509 Heart failure, unspecified: Secondary | ICD-10-CM | POA: Diagnosis not present

## 2015-02-23 DIAGNOSIS — I482 Chronic atrial fibrillation: Secondary | ICD-10-CM | POA: Diagnosis not present

## 2015-02-23 DIAGNOSIS — M25571 Pain in right ankle and joints of right foot: Secondary | ICD-10-CM | POA: Diagnosis not present

## 2015-02-23 DIAGNOSIS — E782 Mixed hyperlipidemia: Secondary | ICD-10-CM | POA: Diagnosis not present

## 2015-02-23 DIAGNOSIS — E039 Hypothyroidism, unspecified: Secondary | ICD-10-CM | POA: Diagnosis not present

## 2015-02-23 DIAGNOSIS — E1165 Type 2 diabetes mellitus with hyperglycemia: Secondary | ICD-10-CM | POA: Diagnosis not present

## 2015-02-23 DIAGNOSIS — R600 Localized edema: Secondary | ICD-10-CM | POA: Diagnosis not present

## 2015-02-24 DIAGNOSIS — M25571 Pain in right ankle and joints of right foot: Secondary | ICD-10-CM | POA: Diagnosis not present

## 2015-02-24 DIAGNOSIS — I5032 Chronic diastolic (congestive) heart failure: Secondary | ICD-10-CM | POA: Diagnosis not present

## 2015-02-27 DIAGNOSIS — I5032 Chronic diastolic (congestive) heart failure: Secondary | ICD-10-CM | POA: Diagnosis not present

## 2015-02-27 DIAGNOSIS — M25571 Pain in right ankle and joints of right foot: Secondary | ICD-10-CM | POA: Diagnosis not present

## 2015-03-02 ENCOUNTER — Telehealth (HOSPITAL_COMMUNITY): Payer: Self-pay | Admitting: Cardiology

## 2015-03-02 NOTE — Telephone Encounter (Signed)
Patient scheduled for DCCV on 03/06/15 with Dr.McLean With patients current insurance- [employer] Cpt code 06770

## 2015-03-04 DIAGNOSIS — M25571 Pain in right ankle and joints of right foot: Secondary | ICD-10-CM | POA: Diagnosis not present

## 2015-03-04 DIAGNOSIS — I5032 Chronic diastolic (congestive) heart failure: Secondary | ICD-10-CM | POA: Diagnosis not present

## 2015-03-05 DIAGNOSIS — I5032 Chronic diastolic (congestive) heart failure: Secondary | ICD-10-CM | POA: Diagnosis not present

## 2015-03-05 DIAGNOSIS — M25571 Pain in right ankle and joints of right foot: Secondary | ICD-10-CM | POA: Diagnosis not present

## 2015-03-06 ENCOUNTER — Ambulatory Visit (HOSPITAL_COMMUNITY)
Admission: RE | Admit: 2015-03-06 | Discharge: 2015-03-06 | Disposition: A | Discharge status: Home / Self Care | Payer: Medicare Other | Source: Ambulatory Visit | Attending: Cardiology | Admitting: Cardiology

## 2015-03-06 ENCOUNTER — Encounter (HOSPITAL_COMMUNITY): Payer: Self-pay | Admitting: Certified Registered Nurse Anesthetist

## 2015-03-06 ENCOUNTER — Ambulatory Visit (HOSPITAL_COMMUNITY): Payer: Medicare Other | Admitting: Certified Registered Nurse Anesthetist

## 2015-03-06 ENCOUNTER — Encounter (HOSPITAL_COMMUNITY)
Admission: RE | Disposition: A | Discharge status: Home / Self Care | Payer: Self-pay | Source: Ambulatory Visit | Attending: Cardiology

## 2015-03-06 DIAGNOSIS — Z7901 Long term (current) use of anticoagulants: Secondary | ICD-10-CM | POA: Insufficient documentation

## 2015-03-06 DIAGNOSIS — I5032 Chronic diastolic (congestive) heart failure: Secondary | ICD-10-CM | POA: Insufficient documentation

## 2015-03-06 DIAGNOSIS — I482 Chronic atrial fibrillation: Secondary | ICD-10-CM | POA: Insufficient documentation

## 2015-03-06 DIAGNOSIS — I10 Essential (primary) hypertension: Secondary | ICD-10-CM | POA: Diagnosis not present

## 2015-03-06 DIAGNOSIS — G4733 Obstructive sleep apnea (adult) (pediatric): Secondary | ICD-10-CM | POA: Diagnosis not present

## 2015-03-06 DIAGNOSIS — Z8249 Family history of ischemic heart disease and other diseases of the circulatory system: Secondary | ICD-10-CM | POA: Insufficient documentation

## 2015-03-06 DIAGNOSIS — I4891 Unspecified atrial fibrillation: Secondary | ICD-10-CM | POA: Diagnosis not present

## 2015-03-06 DIAGNOSIS — I270 Primary pulmonary hypertension: Secondary | ICD-10-CM | POA: Insufficient documentation

## 2015-03-06 DIAGNOSIS — Z87891 Personal history of nicotine dependence: Secondary | ICD-10-CM | POA: Diagnosis not present

## 2015-03-06 DIAGNOSIS — E039 Hypothyroidism, unspecified: Secondary | ICD-10-CM | POA: Insufficient documentation

## 2015-03-06 DIAGNOSIS — E669 Obesity, unspecified: Secondary | ICD-10-CM | POA: Diagnosis not present

## 2015-03-06 DIAGNOSIS — Z794 Long term (current) use of insulin: Secondary | ICD-10-CM | POA: Diagnosis not present

## 2015-03-06 DIAGNOSIS — Z79899 Other long term (current) drug therapy: Secondary | ICD-10-CM | POA: Insufficient documentation

## 2015-03-06 DIAGNOSIS — E119 Type 2 diabetes mellitus without complications: Secondary | ICD-10-CM | POA: Insufficient documentation

## 2015-03-06 HISTORY — PX: CARDIOVERSION: SHX1299

## 2015-03-06 LAB — GLUCOSE, CAPILLARY: Glucose-Capillary: 201 mg/dL — ABNORMAL HIGH (ref 65–99)

## 2015-03-06 LAB — POCT I-STAT 4, (NA,K, GLUC, HGB,HCT)
GLUCOSE: 207 mg/dL — AB (ref 65–99)
HEMATOCRIT: 36 % (ref 36.0–46.0)
HEMOGLOBIN: 12.2 g/dL (ref 12.0–15.0)
Potassium: 4.9 mmol/L (ref 3.5–5.1)
Sodium: 137 mmol/L (ref 135–145)

## 2015-03-06 SURGERY — CARDIOVERSION
Anesthesia: Monitor Anesthesia Care

## 2015-03-06 MED ORDER — SODIUM CHLORIDE 0.9 % IV SOLN
INTRAVENOUS | Status: DC | PRN
  Administered 2015-03-06: 11:00:00 via INTRAVENOUS

## 2015-03-06 MED ORDER — PROPOFOL 10 MG/ML IV BOLUS
INTRAVENOUS | Status: DC | PRN
  Administered 2015-03-06: 30 mg via INTRAVENOUS
  Administered 2015-03-06: 70 mg via INTRAVENOUS

## 2015-03-06 MED ORDER — SODIUM CHLORIDE 0.9 % IV SOLN
INTRAVENOUS | Status: DC
Start: 2015-03-06 — End: 2015-03-06
  Administered 2015-03-06: 500 mL via INTRAVENOUS

## 2015-03-06 NOTE — Anesthesia Preprocedure Evaluation (Addendum)
Anesthesia Evaluation  Patient identified by MRN, date of birth, ID band Patient awake    Reviewed: Allergy & Precautions, H&P , NPO status , Patient's Chart, lab work & pertinent test results  Airway Mallampati: II  TM Distance: >3 FB     Dental  (+) Edentulous Upper, Edentulous Lower, Upper Dentures, Lower Dentures, Dental Advisory Given   Pulmonary shortness of breath, sleep apnea , former smoker,  (-) CPAP breath sounds clear to auscultation  Pulmonary exam normal       Cardiovascular hypertension, Pt. on medications - angina+CHF Normal cardiovascular exam+ dysrhythmias Atrial Fibrillation Rhythm:Irregular Rate:Normal     Neuro/Psych Seizures -,  Anxiety claustrophobia Last seizure 6 years ago (+Keppra)    GI/Hepatic hiatal hernia, GERD-  Medicated,  Endo/Other  diabetes, Well Controlled, Type 2, Insulin DependentHypothyroidism   Renal/GU      Musculoskeletal   Abdominal   Peds  Hematology   Anesthesia Other Findings   Reproductive/Obstetrics                            Anesthesia Physical  Anesthesia Plan  ASA: III  Anesthesia Plan: General   Post-op Pain Management:    Induction: Intravenous  Airway Management Planned: Mask  Additional Equipment:   Intra-op Plan:   Post-operative Plan:   Informed Consent: I have reviewed the patients History and Physical, chart, labs and discussed the procedure including the risks, benefits and alternatives for the proposed anesthesia with the patient or authorized representative who has indicated his/her understanding and acceptance.   Dental advisory given  Plan Discussed with: CRNA and Surgeon  Anesthesia Plan Comments:        Anesthesia Quick Evaluation

## 2015-03-06 NOTE — Discharge Instructions (Signed)
Electrical Cardioversion, Care After °Refer to this sheet in the next few weeks. These instructions provide you with information on caring for yourself after your procedure. Your health care provider may also give you more specific instructions. Your treatment has been planned according to current medical practices, but problems sometimes occur. Call your health care provider if you have any problems or questions after your procedure. °WHAT TO EXPECT AFTER THE PROCEDURE °After your procedure, it is typical to have the following sensations: °· Some redness on the skin where the shocks were delivered. If this is tender, a sunburn lotion or hydrocortisone cream may help. °· Possible return of an abnormal heart rhythm within hours or days after the procedure. °HOME CARE INSTRUCTIONS °· Take medicines only as directed by your health care provider. Be sure you understand how and when to take your medicine. °· Learn how to feel your pulse and check it often. °· Limit your activity for 48 hours after the procedure or as directed by your health care provider. °· Avoid or minimize caffeine and other stimulants as directed by your health care provider. °SEEK MEDICAL CARE IF: °· You feel like your heart is beating too fast or your pulse is not regular. °· You have any questions about your medicines. °· You have bleeding that will not stop. °SEEK IMMEDIATE MEDICAL CARE IF: °· You are dizzy or feel faint. °· It is hard to breathe or you feel short of breath. °· There is a change in discomfort in your chest. °· Your speech is slurred or you have trouble moving an arm or leg on one side of your body. °· You get a serious muscle cramp that does not go away. °· Your fingers or toes turn cold or blue. °Document Released: 04/10/2013 Document Revised: 11/04/2013 Document Reviewed: 04/10/2013 °Exit[employer]® Patient Information ©2015 [employer]. This information is not intended to replace advice given to you by your health care provider.  Make sure you discuss any questions you have with your health care provider. ° ° °Conscious Sedation, Adult, Care After °Refer to this sheet in the next few weeks. These instructions provide you with information on caring for yourself after your procedure. Your health care provider may also give you more specific instructions. Your treatment has been planned according to current medical practices, but problems sometimes occur. Call your health care provider if you have any problems or questions after your procedure. °WHAT TO EXPECT AFTER THE PROCEDURE  °After your procedure: °· You may feel sleepy, clumsy, and have poor balance for several hours. °· Vomiting may occur if you eat too soon after the procedure. °HOME CARE INSTRUCTIONS °· Do not participate in any activities where you could become injured for at least 24 hours. Do not: °¨ Drive. °¨ Swim. °¨ Ride a bicycle. °¨ Operate heavy machinery. °¨ Cook. °¨ Use power tools. °¨ Climb ladders. °¨ Work from a high place. °· Do not make important decisions or sign legal documents until you are improved. °· If you vomit, drink water, juice, or soup when you can drink without vomiting. Make sure you have little or no nausea before eating solid foods. °· Only take over-the-counter or prescription medicines for pain, discomfort, or fever as directed by your health care provider. °· Make sure you and your family fully understand everything about the medicines given to you, including what side effects may occur. °· You should not drink alcohol, take sleeping pills, or take medicines that cause drowsiness for at least 24   hours. °· If you smoke, do not smoke without supervision. °· If you are feeling better, you may resume normal activities 24 hours after you were sedated. °· Keep all appointments with your health care provider. °SEEK MEDICAL CARE IF: °· Your skin is pale or bluish in color. °· You continue to feel nauseous or vomit. °· Your pain is getting worse and is not  helped by medicine. °· You have bleeding or swelling. °· You are still sleepy or feeling clumsy after 24 hours. °SEEK IMMEDIATE MEDICAL CARE IF: °· You develop a rash. °· You have difficulty breathing. °· You develop any type of allergic problem. °· You have a fever. °MAKE SURE YOU: °· Understand these instructions. °· Will watch your condition. °· Will get help right away if you are not doing well or get worse. °Document Released: 04/10/2013 Document Reviewed: 04/10/2013 °ExitCare® Patient Information ©2015 [employer]. This information is not intended to replace advice given to you by your health care provider. Make sure you discuss any questions you have with your health care provider. ° °

## 2015-03-06 NOTE — Anesthesia Postprocedure Evaluation (Signed)
  Anesthesia Post-op Note  Patient: [name]  Procedure(s) Performed: Procedure(s): CARDIOVERSION (N/A)  Patient Location: PACU  Anesthesia Type:MAC  Level of Consciousness: awake, alert  and oriented  Airway and Oxygen Therapy: Patient Spontanous Breathing and Patient connected to nasal cannula oxygen  Post-op Pain: none  Post-op Assessment: Post-op Vital signs reviewed and Patient's Cardiovascular Status Stable              Post-op Vital Signs: Reviewed and stable  Last Vitals:  Filed Vitals:   03/06/15 1050  BP: 123/63  Pulse: 92  Temp: 36.9 C  Resp: 14    Complications: No apparent anesthesia complications

## 2015-03-06 NOTE — Procedures (Signed)
Electrical Cardioversion Procedure Note [name] [id number] 1951/03/12  Procedure: Electrical Cardioversion Indications:  Atrial Fibrillation  Procedure Details Consent: Risks of procedure as well as the alternatives and risks of each were explained to the (patient/caregiver).  Consent for procedure obtained. Time Out: Verified patient identification, verified procedure, site/side was marked, verified correct patient position, special equipment/implants available, medications/allergies/relevent history reviewed, required imaging and test results available.  Performed  Patient placed on cardiac monitor, pulse oximetry, supplemental oxygen as necessary.  Sedation given: Propofol Pacer pads placed anterior and posterior chest.  Cardioverted 1 time(s).  Cardioverted at 200J.  Evaluation Findings: Post procedure EKG shows: NSR Complications: None Patient did tolerate procedure well.   Marca Ancona 03/06/2015, 12:02 PM

## 2015-03-06 NOTE — Transfer of Care (Signed)
Immediate Anesthesia Transfer of Care Note  Patient: [name]  Procedure(s) Performed: Procedure(s): CARDIOVERSION (N/A)  Patient Location: PACU  Anesthesia Type:MAC  Level of Consciousness: awake, alert  and oriented  Airway & Oxygen Therapy: Patient Spontanous Breathing and Patient connected to nasal cannula oxygen  Post-op Assessment: Report given to RN and Post -op Vital signs reviewed and stable  Post vital signs: Reviewed and stable  Last Vitals:  Filed Vitals:   03/06/15 1050  BP: 123/63  Pulse: 92  Temp: 36.9 C  Resp: 14    Complications: No apparent anesthesia complications

## 2015-03-06 NOTE — H&P (View-Only) (Signed)
Patient ID: [name], female   DOB: 08/25/1950, 63 y.o.   MRN: [id number] PCP: Dr. Knowlton Primary cardiologist: Dr Branch  63 yo with history of chronic atrial fibrillation and chronic diastolic CHF presents for cardiology followup.  She had developed progressive dyspnea and volume overload/weight gain over several months.  I brought her in for RHC in 7/16 showing markedly elevated right and left heart filling pressures and primarily pulmonary venous hypertension.  I admitted her and diuresed weight with weight falling significantly.  Echo showed preserved LV systolic function.  She was sent home on Lasix 60 mg bid.  She was re-admitted after standing up rapidly and passing out.  She sprained her right ankle.  Lasix was cut back to 40 mg daily and lisinopril and Coreg were stopped.    She is no longer lightheaded with standing, but weight is starting to creep back up again (3-4 lbs in the last week).  She does not have significant dyspnea but has not been walking much with sprained ankle.  No orthopnea/PND.  No chest pain.  Increasing edema and abdominal distention.    ECG (8/16): Atrial fibrillation with LAFB  Labs (8/16): K 4.7, creatinine 0.7, LFTs normal, TSH normal, HCT 32.2  PMH: 1. Chronic atrial fibrillation 2. Chronic diastolic CHF: EF was previously down to 40%, but most recent echo in 7/16 showed EF 55-60%, moderate LVH, mild MR, severe biatrial enlargement, PA systolic pressure 46 mmHg.  RHC (7/16) with mean RA 23, PA 70/36 mean 52, mean PCWP 31, CI 2.32, PVR 4.6 WU.  3. GERD with Barretts esophagus 4. Hypothyroidism 5. OSA on CPAP 6. Type II diabetes 7. IBS  SH: Married, retired (used to work at [employer]).  Daughter brings to appts.  Lives in [place].  Prior smoker, quit 1984.  FH: Mother with MI  ROS: All systems reviewed and negative except as per HPI.   Current Outpatient Prescriptions  Medication Sig Dispense Refill  . amiodarone (PACERONE) 200 MG tablet Take 1  tablet (200 mg total) by mouth 2 (two) times daily. 60 tablet 6  . dicyclomine (BENTYL) 10 MG capsule 1 po 30 mins before meals up to three times a day or q4h prn for abdominal pain/diarrhea. NO MORE 8 DOSES IN A DAY. (Patient taking differently: Take 10 mg by mouth daily as needed. 1 po 30 mins before meals up to three times a day or q4h prn for abdominal pain/diarrhea. NO MORE 8 DOSES IN A DAY.) 45 capsule 11  . dronabinol (MARINOL) 2.5 MG capsule Take 1 capsule (2.5 mg total) by mouth 2 (two) times daily before a meal. 60 capsule 0  . DULoxetine (CYMBALTA) 30 MG capsule Take 30 mg by mouth 2 (two) times daily.    . ELIQUIS 5 MG TABS tablet TAKE 1 TABLET BY MOUTH TWICE DAILY. 60 tablet 3  . furosemide (LASIX) 40 MG tablet Take 40 mg in AM and 20 mg in PM 30 tablet 1  . glipiZIDE (GLUCOTROL) 10 MG tablet Take 10 mg by mouth daily before breakfast.     . Insulin Glargine (LANTUS SOLOSTAR) 100 UNIT/ML Solostar Pen Inject 20-50 Units into the skin at bedtime as needed. Patient takes 20 units every night at bedtime if over 178.  Takes 50 units if her blood sugar is above 300.    . levETIRAcetam (KEPPRA) 250 MG tablet Take 1 tablet (250 mg total) by mouth 2 (two) times daily. 60 tablet 11  . levothyroxine (SYNTHROID, LEVOTHROID) 137   MCG tablet Take 137 mcg by mouth daily.    Marland Kitchen loperamide (IMODIUM) 2 MG capsule TAKE (1) CAPSULE THREE TIMES DAILY AS NEEDED FOR DIARRHEA OR LOOSE STOOLS. 30 capsule 1  . LORazepam (ATIVAN) 1 MG tablet Take 1 mg by mouth at bedtime. For anxiety    . magnesium oxide (MAG-OX) 400 MG tablet Take 400 mg by mouth 2 (two) times daily.    . metaxalone (SKELAXIN) 800 MG tablet Take 800 mg by mouth 2 (two) times daily.     . metFORMIN (GLUCOPHAGE) 500 MG tablet Take 1,000 mg by mouth 2 (two) times daily with a meal.     . Multiple Vitamin (MULTIVITAMIN WITH MINERALS) TABS tablet Take 1 tablet by mouth daily.    . nitroGLYCERIN (NITROSTAT) 0.4 MG SL tablet Place 0.4 mg under the tongue  every 5 (five) minutes as needed for chest pain. For chest pain     . omeprazole (PRILOSEC) 20 MG capsule Take 20 mg by mouth 2 (two) times daily.      . ondansetron (ZOFRAN) 4 MG tablet Take 4 mg by mouth every 6 (six) hours as needed. For nausea    . POLY-IRON 150 150 MG capsule TAKE 1 CAPSULE BY MOUTH 2 TIMES DAILY. 60 capsule 3  . potassium chloride (K-DUR,KLOR-CON) 10 MEQ tablet Take 20 mEq by mouth 2 (two) times daily.    . vitamin B-12 (CYANOCOBALAMIN) 500 MCG tablet Take 500 mcg by mouth daily.     No current facility-administered medications for this encounter.   BP 100/58 mmHg  Pulse 101  Wt 178 lb 8 oz (80.967 kg)  SpO2 99% General: NAD Neck: JVP 8 cm, no thyromegaly or thyroid nodule.  Lungs: Clear to auscultation bilaterally with normal respiratory effort. CV: Nondisplaced PMI.  Heart irregular S1/S2, no S3/S4, no murmur.  1+ edema to knees bilaterally, R>L.  No carotid bruit.  Normal pedal pulses.  Abdomen: Soft, nontender, no hepatosplenomegaly, mild distention.  Skin: Intact without lesions or rashes.  Neurologic: Alert and oriented x 3.  Psych: Normal affect. Extremities: No clubbing or cyanosis.  HEENT: Normal.   Assessment/Plan: 1. Chronic diastolic CHF: Significant CHF in the setting of long-standing atrial fibrillation.  She is re-accumulating volume now after Lasix was decreased.  - Increase Lasix to 40 qam/20 qpm.  I suspect we may need to go higher, but will be careful given orthostatic symptoms recently.  - BMET in 2 wks.  2. Atrial fibrillation: Chronic.  I suspect that this contributes to CHF.  I would like to give her a trial of DCCV.  We have been loading amiodarone.  She has been on Eliquis without missing doses.  I will bring her back in in 2 wks to try DCCV.  If this fails, can stop amiodarone.  TSH/LFTs normal recently.  Will need regular eye exams and baseline PFTs if she remains on amiodarone.  3. Orthostatic symptoms: Resolved off ACEI and Coreg.   Given normal EF, these are not vital meds, will leave off.   Marca Ancona 02/20/2015 7:11 AM

## 2015-03-06 NOTE — Interval H&P Note (Signed)
History and Physical Interval Note:  03/06/2015 11:42 AM  [name]  has presented today for surgery, with the diagnosis of afib  The various methods of treatment have been discussed with the patient and family. After consideration of risks, benefits and other options for treatment, the patient has consented to  Procedure(s): CARDIOVERSION (N/A) as a surgical intervention .  The patient's history has been reviewed, patient examined, no change in status, stable for surgery.  I have reviewed the patient's chart and labs.  Questions were answered to the patient's satisfaction.     Dalton [employer]

## 2015-03-08 ENCOUNTER — Encounter (HOSPITAL_COMMUNITY): Payer: Self-pay | Admitting: Cardiology

## 2015-03-09 IMAGING — CR DG CHEST 1V PORT
1 series · 1 of 1 positions shown · non-contrast
Comparison: 10/04/2012

CLINICAL DATA: Fatigue.  Hypertension.  CHF.

PORTABLE CHEST - 1 VIEW

[view not recorded]
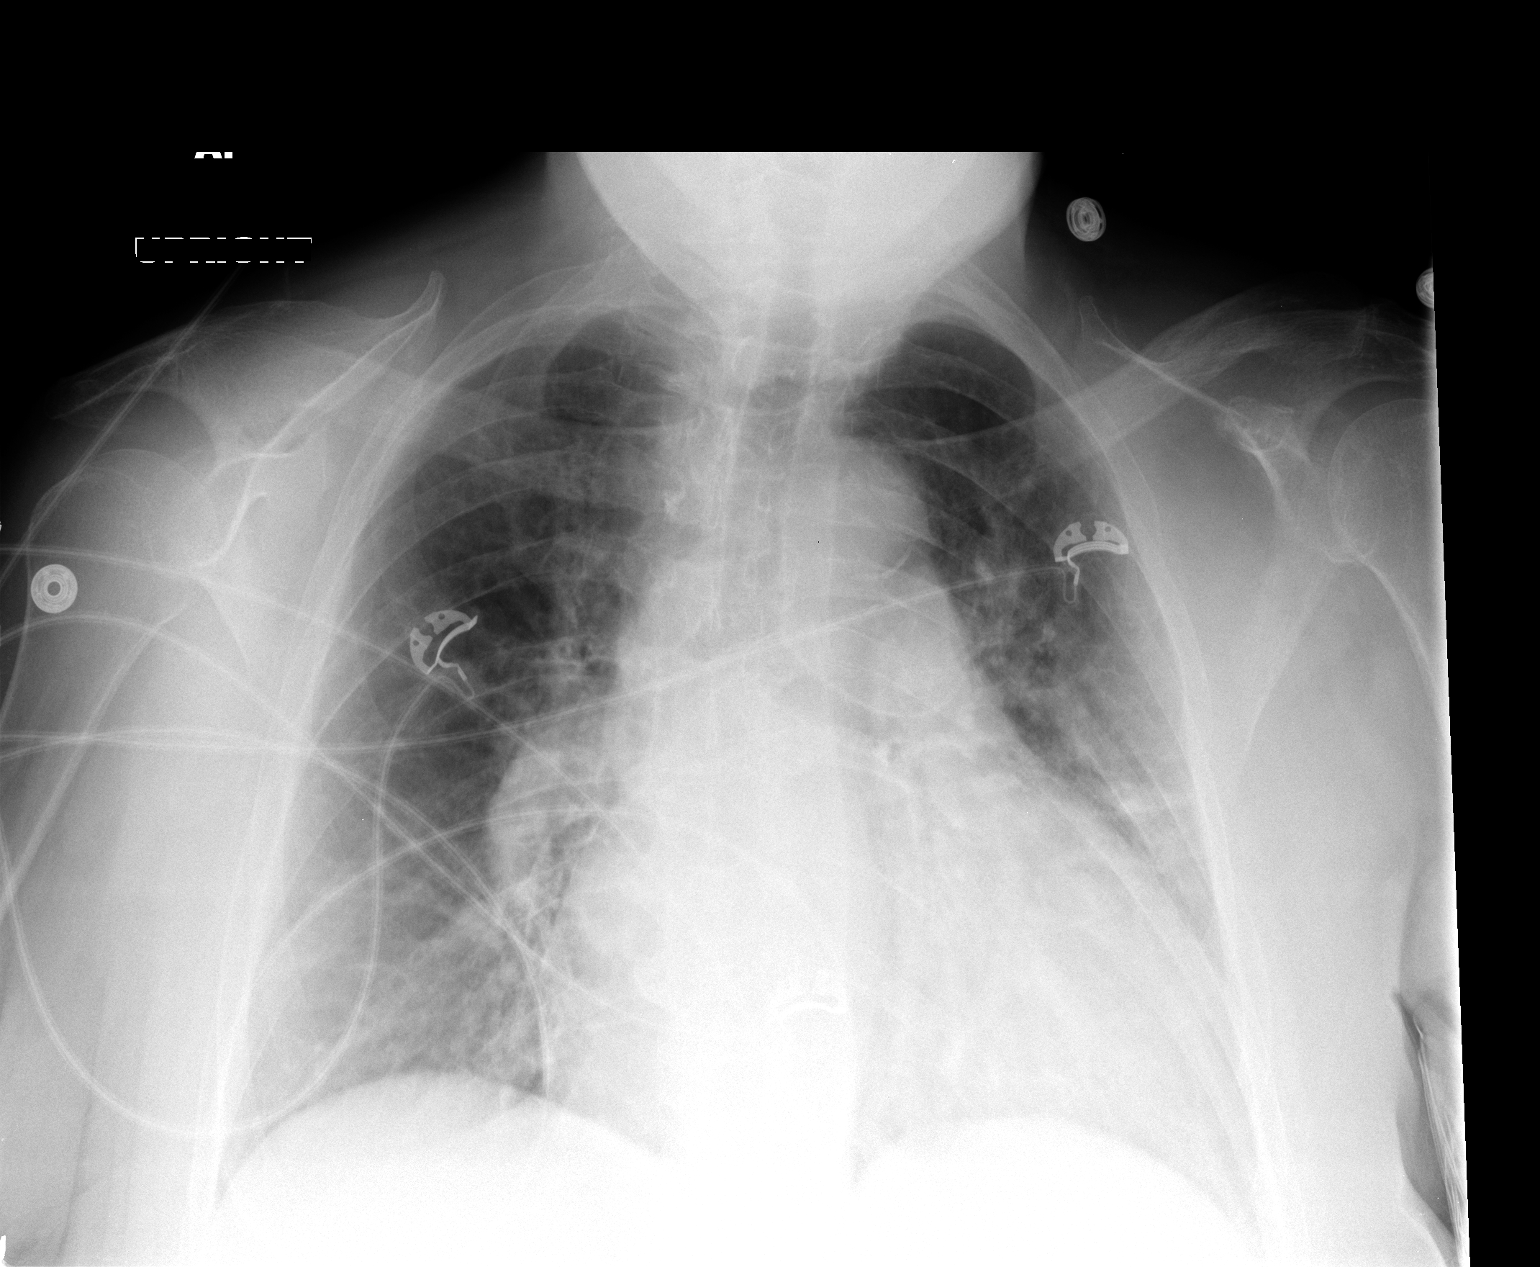

[1 of 1 positions shown; findings below may reference images not displayed]

FINDINGS: Mildly degraded exam due to AP portable technique and
patient body habitus.

Numerous leads and wires project over the chest.  Midline trachea.
Cardiomegaly accentuated by AP portable technique.  Aortic
atherosclerosis.  Apparent right pulmonary artery prominence is
favored to be technique related.  No right and no definite left
pleural effusion. No pneumothorax.  Low lung volumes with resultant
pulmonary interstitial prominence.  No lobar consolidation.  Linear
atelectasis or scar suspected in the left midlung.  Left lung base
suboptimally evaluated secondary to overlying soft tissues.
IMPRESSION: Cardiomegaly and low lung volumes, without congestive failure or
acute disease.

## 2015-03-10 DIAGNOSIS — M25571 Pain in right ankle and joints of right foot: Secondary | ICD-10-CM | POA: Diagnosis not present

## 2015-03-10 DIAGNOSIS — I5032 Chronic diastolic (congestive) heart failure: Secondary | ICD-10-CM | POA: Diagnosis not present

## 2015-03-13 ENCOUNTER — Ambulatory Visit (INDEPENDENT_AMBULATORY_CARE_PROVIDER_SITE_OTHER): Payer: Medicare Other | Admitting: Gastroenterology

## 2015-03-13 ENCOUNTER — Encounter: Payer: Self-pay | Admitting: Gastroenterology

## 2015-03-13 VITALS — BP 100/57 | HR 55 | Temp 98.1°F | Ht 66.0 in | Wt 177.0 lb

## 2015-03-13 DIAGNOSIS — E1142 Type 2 diabetes mellitus with diabetic polyneuropathy: Secondary | ICD-10-CM | POA: Diagnosis not present

## 2015-03-13 DIAGNOSIS — K589 Irritable bowel syndrome without diarrhea: Secondary | ICD-10-CM | POA: Diagnosis not present

## 2015-03-13 DIAGNOSIS — D509 Iron deficiency anemia, unspecified: Secondary | ICD-10-CM | POA: Diagnosis not present

## 2015-03-13 DIAGNOSIS — B351 Tinea unguium: Secondary | ICD-10-CM | POA: Diagnosis not present

## 2015-03-13 DIAGNOSIS — K3184 Gastroparesis: Secondary | ICD-10-CM | POA: Diagnosis not present

## 2015-03-13 NOTE — Assessment & Plan Note (Signed)
Continue to follow with Dr. Galen Manila. Recent Hgb normal.

## 2015-03-13 NOTE — Progress Notes (Signed)
Primary Care Physician: [name], MD  Primary Gastroenterologist:  Jonette Eva, MD   Chief Complaint  Patient presents with  . Follow-up    HPI: [name] is a 64 y.o. female here for follow-up of chronic abdominal pain, chronic diarrhea, gastroparesis, history of IDA and weight loss. EGD in May 2016 for h/o Barrett's, anemia. She had mild nonerosive gastritis, no evidence of Barrett's. Per Dr. Darrick Penna, no further Barrett's surveillance recommended.  March 2015, small bowel capsule endoscopy for history of small bowel ulcers. She was found to have distal small bowel ulcers extending to the terminal ileum. Colonoscopy May 2015 she had normal terminal ileum, moderate diverticulosis, no obvious source for erosions/diarrhea/anemia identified. Ileal biopsies were negative. Colon biopsies negative.   CT abdomen and pelvis with contrast of March 2015 for weight loss with no explanation.  CT of the abdomen pelvis with contrast on 08/25/2014 showed enlarged liver, fatty containing anterior abdominal wall hernia, ascites/third spacing of fluid with etiology indeterminate but possibly related to heart dysfunction, prominent hepatic veins. Cardiomegaly.   Abd U/S 10/2014 limited, scattered mild ascites.  Complains of intermittent lower abdominal pain. Happens with/without BM. Some nausea but no vomiting. Takes bentyl and zofran every morning. Abdominal pain may last for few hours but usually less than a day. Has not taken additional bentyl for the pain. Takes marinol after meals. Recent constipation.  No recent diarrhea for three weeks. No melena, brbpr.BM most day but hard stool. Gas-x.   Underwent cardiac cath and cardioversion for Afib.   Current Outpatient Prescriptions  Medication Sig Dispense Refill  . acetaminophen (TYLENOL) 500 MG tablet Take 500 mg by mouth every 6 (six) hours as needed for moderate pain or headache.    Marland Kitchen amiodarone (PACERONE) 200 MG tablet Take 1  tablet (200 mg total) by mouth 2 (two) times daily. 60 tablet 6  . dicyclomine (BENTYL) 10 MG capsule 1 po 30 mins before meals up to three times a day or q4h prn for abdominal pain/diarrhea. NO MORE 8 DOSES IN A DAY. (Patient taking differently: Take 10 mg by mouth 2 (two) times daily as needed for spasms. ) 45 capsule 11  . dronabinol (MARINOL) 2.5 MG capsule Take 1 capsule (2.5 mg total) by mouth 2 (two) times daily before a meal. 60 capsule 0  . DULoxetine (CYMBALTA) 30 MG capsule Take 30 mg by mouth 2 (two) times daily.    Marland Kitchen ELIQUIS 5 MG TABS tablet TAKE 1 TABLET BY MOUTH TWICE DAILY. (Patient taking differently: TAKE 5 MG BY MOUTH TWICE DAILY.) 60 tablet 3  . furosemide (LASIX) 40 MG tablet Take 40 mg in AM and 20 mg in PM (Patient taking differently: Take 20-40 mg by mouth 2 (two) times daily. Take 40 mg in AM and 20 mg in PM) 30 tablet 1  . glipiZIDE (GLUCOTROL) 10 MG tablet Take 10 mg by mouth daily before breakfast.     . Insulin Glargine (LANTUS SOLOSTAR) 100 UNIT/ML Solostar Pen Inject 20-50 Units into the skin at bedtime as needed. Patient takes 20 units every night at bedtime if over 178.  Takes 50 units if her blood sugar is above 300.    . levETIRAcetam (KEPPRA) 250 MG tablet Take 1 tablet (250 mg total) by mouth 2 (two) times daily. 60 tablet 11  . levothyroxine (SYNTHROID, LEVOTHROID) 137 MCG tablet Take 137 mcg by mouth daily.    Marland Kitchen loperamide (IMODIUM) 2 MG capsule TAKE (1) CAPSULE THREE TIMES  DAILY AS NEEDED FOR DIARRHEA OR LOOSE STOOLS. (Patient taking differently: TAKE 2 MG THREE TIMES DAILY AS NEEDED FOR DIARRHEA OR LOOSE STOOLS.) 30 capsule 1  . LORazepam (ATIVAN) 1 MG tablet Take 1 mg by mouth 3 (three) times daily as needed for anxiety or sleep. For anxiety    . magnesium oxide (MAG-OX) 400 MG tablet Take 400 mg by mouth daily.     . metaxalone (SKELAXIN) 800 MG tablet Take 800 mg by mouth 3 (three) times daily.     . metFORMIN (GLUCOPHAGE) 500 MG tablet Take 1,000 mg by mouth  2 (two) times daily with a meal.     . Multiple Vitamin (MULTIVITAMIN WITH MINERALS) TABS tablet Take 1 tablet by mouth daily.    . nitroGLYCERIN (NITROSTAT) 0.4 MG SL tablet Place 0.4 mg under the tongue every 5 (five) minutes as needed for chest pain. For chest pain     . omeprazole (PRILOSEC) 20 MG capsule Take 20 mg by mouth 2 (two) times daily.      . ondansetron (ZOFRAN) 4 MG tablet Take 4 mg by mouth every 6 (six) hours as needed. For nausea    . POLY-IRON 150 150 MG capsule TAKE 1 CAPSULE BY MOUTH 2 TIMES DAILY. (Patient taking differently: TAKE 150 MG BY MOUTH 2 TIMES DAILY.) 60 capsule 3  . potassium chloride (K-DUR,KLOR-CON) 10 MEQ tablet Take 20 mEq by mouth 2 (two) times daily.    . vitamin B-12 (CYANOCOBALAMIN) 500 MCG tablet Take 500 mcg by mouth daily.     No current facility-administered medications for this visit.    Allergies as of 03/13/2015 - Review Complete 03/13/2015  Allergen Reaction Noted  . Ibuprofen Other (See Comments) 10/13/2013  . Codeine Nausea And Vomiting and Other (See Comments)   . Celexa [citalopram hydrobromide] Other (See Comments) 06/30/2013  . Reglan [metoclopramide] Other (See Comments) 06/04/2014    ROS:  General: Negative for anorexia, weight loss, fever, chills, fatigue, weakness. ENT: Negative for hoarseness, difficulty swallowing , nasal congestion. CV: Negative for chest pain, angina, palpitations, dyspnea on exertion, peripheral edema.  Respiratory: Negative for dyspnea at rest, dyspnea on exertion, cough, sputum, wheezing.  GI: See history of present illness. GU:  Negative for dysuria, hematuria, urinary incontinence, urinary frequency, nocturnal urination.  Endo: Negative for unusual weight change.    Physical Examination:   BP 100/57 mmHg  Pulse 55  Temp(Src) 98.1 F (36.7 C) (Oral)  Ht  (1.676 m)  Wt 177 lb (80.287 kg)  BMI 28.58 kg/m2  General: Well-nourished, well-developed in no acute distress. Accompanied by  daughter.  Eyes: No icterus. Mouth: Oropharyngeal mucosa moist and pink , no lesions erythema or exudate. Lungs: Clear to auscultation bilaterally.  Heart: Regular rate and rhythm, no murmurs rubs or gallops.  Abdomen: Bowel sounds are normal, nontender, nondistended, no hepatosplenomegaly or masses, no abdominal bruits or hernia , no rebound or guarding.   Extremities: No lower extremity edema. No clubbing or deformities. Neuro: Alert and oriented x 4   Skin: Warm and dry, no jaundice.   Psych: Alert and cooperative, normal mood and affect.  Labs:  Lab Results  Component Value Date   WBC 7.0 02/13/2015   HGB 12.2 03/06/2015   HCT 36.0 03/06/2015   MCV 81.3 02/13/2015   PLT 182 02/13/2015   Lab Results  Component Value Date   CREATININE 0.70 02/13/2015   BUN 23 02/13/2015   NA 137 03/06/2015   K 4.9 03/06/2015   CL  104 02/13/2015   CO2 24 02/13/2015   Lab Results  Component Value Date   ALT 7 02/13/2015   AST 12 02/13/2015   ALKPHOS 81 02/13/2015   BILITOT 0.5 02/13/2015   Lab Results  Component Value Date   TSH 3.854 02/13/2015   Lab Results  Component Value Date   IRON 29 12/26/2014   TIBC 458* 12/26/2014   FERRITIN 87 01/19/2015    Imaging Studies: No results found.

## 2015-03-13 NOTE — Assessment & Plan Note (Signed)
Takes Zofran every morning to PREVENT nausea. Taking Marinol after meals. Suggested taking Marinol 30-60 minutes prior to breakfast and evening meal. Try to limit zofran, use only if needed. May be contributing to constipation. Return to office in six months.

## 2015-03-13 NOTE — Assessment & Plan Note (Addendum)
Alternating constipation/diarrhea, low abdominal pain. Likely due to IBS. Encouraged trial of bentyl when pain develops. She has had no pain since July. She will call with further problems.   Add 1/2-1 capful of miralax as needed during constipation.

## 2015-03-13 NOTE — Patient Instructions (Signed)
1. During periods of constipation, try adding 1/2-1 capful of Miralax at bedtime. 2. For abdominal pain, try Bentyl one capsule. If recurrent abdominal pain, please call.  3. Take Marinol 30-60 minutes prior to breakfast and evening meal. This should help your nausea. Try to limit Zofran as this will contribute to constipation.  4. Return to see Dr. Darrick Penna in six months or call sooner if needed.

## 2015-03-16 ENCOUNTER — Encounter (HOSPITAL_COMMUNITY): Payer: Medicare Other | Attending: Oncology | Admitting: Hematology & Oncology

## 2015-03-16 ENCOUNTER — Encounter (HOSPITAL_BASED_OUTPATIENT_CLINIC_OR_DEPARTMENT_OTHER): Payer: Medicare Other

## 2015-03-16 ENCOUNTER — Encounter (HOSPITAL_COMMUNITY): Payer: Self-pay | Admitting: Hematology & Oncology

## 2015-03-16 VITALS — BP 107/55 | HR 55 | Temp 98.5°F | Resp 18 | Wt 172.0 lb

## 2015-03-16 DIAGNOSIS — D509 Iron deficiency anemia, unspecified: Secondary | ICD-10-CM

## 2015-03-16 DIAGNOSIS — Z1239 Encounter for other screening for malignant neoplasm of breast: Secondary | ICD-10-CM | POA: Diagnosis not present

## 2015-03-16 LAB — FERRITIN: Ferritin: 30 ng/mL (ref 11–307)

## 2015-03-16 LAB — CBC WITH DIFFERENTIAL/PLATELET
BASOS PCT: 1 % (ref 0–1)
Basophils Absolute: 0.1 10*3/uL (ref 0.0–0.1)
Eosinophils Absolute: 0.1 10*3/uL (ref 0.0–0.7)
Eosinophils Relative: 1 % (ref 0–5)
HEMATOCRIT: 38.2 % (ref 36.0–46.0)
Hemoglobin: 11.9 g/dL — ABNORMAL LOW (ref 12.0–15.0)
LYMPHS ABS: 1.9 10*3/uL (ref 0.7–4.0)
Lymphocytes Relative: 31 % (ref 12–46)
MCH: 27 pg (ref 26.0–34.0)
MCHC: 31.2 g/dL (ref 30.0–36.0)
MCV: 86.6 fL (ref 78.0–100.0)
MONO ABS: 0.5 10*3/uL (ref 0.1–1.0)
MONOS PCT: 9 % (ref 3–12)
NEUTROS ABS: 3.8 10*3/uL (ref 1.7–7.7)
Neutrophils Relative %: 58 % (ref 43–77)
Platelets: 216 10*3/uL (ref 150–400)
RBC: 4.41 MIL/uL (ref 3.87–5.11)
RDW: 20.9 % — AB (ref 11.5–15.5)
WBC: 6.4 10*3/uL (ref 4.0–10.5)

## 2015-03-16 NOTE — Progress Notes (Signed)
LABS DRAWN

## 2015-03-16 NOTE — Progress Notes (Signed)
cc'ed to pcp °

## 2015-03-16 NOTE — Progress Notes (Signed)
[facility] Hematology/Oncology Consultation   Name: [name]      MRN: [id number]     Date: 03/16/2015 Time:11:07 AM   REFERRING PHYSICIAN:  Barney Drain, MD  REASON FOR CONSULT:  Iron deficiency anemia   DIAGNOSIS:  Microcytic, hypochromic anemia in the setting of a normal platelet count and WBC count with a recent ferritin of 7 on 11/21/2014.  HISTORY OF PRESENT ILLNESS:   [name] is a 64 yo white American woman with a past medical history significant for IBS, hypothyroidism, hyperlipidemia, gastroparesis, HTN, DM type 2, CHF, and A-Fib was referred to [facility] for iron deficiency anemia after an extensive GI work-up that is negative including colonoscopy (11/08/2013), EGD (11/21/2014), and camera capsule study.   [name] is here today with her daughter.  [name] has had two hospitalizations recently. She became dehydrated after "having excess fluid removed."  She notes that she is overall doing better than when we first met. She notes that her abdomen is soft and not swollen.  She has not had a mammogram this year but is agreeable to having one set up. She has undergone a cardioversion.  PAST MEDICAL HISTORY:   Past Medical History  Diagnosis Date  . Chest pain     Negative cardiac catheterization in 2002; negative stress nuclear study in 2008  . Chronic diastolic CHF (congestive heart failure)     a. EF predominantly normal during prior echoes but was 40% during 10/2014 echo. b. Most recent 01/2015 EF normal, 55-60%.  . Paroxysmal atrial fibrillation   . Chronic anticoagulation   . Diabetes mellitus, type 2     Insulin therapy; exacerbated by prednisone  . Syncope     a. Admitted 05/2009; magnetic resonance imagin/ MRA - negative; etiology thought to be orthostasis secondary to drugs and dehydration. b. Syncope 02/2015 also felt 2/2 dehydration.  Marland Kitchen GERD (gastroesophageal reflux disease)   . IBS (irritable bowel syndrome)   . Hiatal hernia   .  Gastroparesis     99% retention 05/2008 on GES  . Hyperlipidemia   . Hypertension   . Hypothyroid   . Chronic LBP     Surgical intervention in 1996  . Obesity   . OSA on CPAP   . Anemia     H/H of 10/30 with a normal MCV in 12/09  . Barrett's esophagus     Diagnosed 1995. Last EGD 2016-NO BARRETT'S.   . Seizures   . Pulmonary hypertension 01/2015    a. Predominantly pulmonary venous hypertension but may be component of PAH.    ALLERGIES: Allergies  Allergen Reactions  . Ibuprofen Other (See Comments)    Upsets her stomach and causes abdominal pain  . Codeine Nausea And Vomiting and Other (See Comments)    HALLUCINATIONS  . Celexa [Citalopram Hydrobromide] Other (See Comments)    Dyskinesia  . Reglan [Metoclopramide] Other (See Comments)    DYSKINESIA      MEDICATIONS: I have reviewed the patient's current medications.    Current Outpatient Prescriptions on File Prior to Visit  Medication Sig Dispense Refill  . acetaminophen (TYLENOL) 500 MG tablet Take 500 mg by mouth every 6 (six) hours as needed for moderate pain or headache.    Marland Kitchen amiodarone (PACERONE) 200 MG tablet Take 1 tablet (200 mg total) by mouth 2 (two) times daily. 60 tablet 6  . dicyclomine (BENTYL) 10 MG capsule 1 po 30 mins before meals up to three  times a day or q4h prn for abdominal pain/diarrhea. NO MORE 8 DOSES IN A DAY. (Patient taking differently: Take 10 mg by mouth 2 (two) times daily as needed for spasms. ) 45 capsule 11  . dronabinol (MARINOL) 2.5 MG capsule Take 1 capsule (2.5 mg total) by mouth 2 (two) times daily before a meal. 60 capsule 0  . DULoxetine (CYMBALTA) 30 MG capsule Take 30 mg by mouth 2 (two) times daily.    Marland Kitchen ELIQUIS 5 MG TABS tablet TAKE 1 TABLET BY MOUTH TWICE DAILY. (Patient taking differently: TAKE 5 MG BY MOUTH TWICE DAILY.) 60 tablet 3  . furosemide (LASIX) 40 MG tablet Take 40 mg in AM and 20 mg in PM (Patient taking differently: Take 20-40 mg by mouth 2 (two) times daily. Take  40 mg in AM and 20 mg in PM) 30 tablet 1  . glipiZIDE (GLUCOTROL) 10 MG tablet Take 10 mg by mouth daily before breakfast.     . Insulin Glargine (LANTUS SOLOSTAR) 100 UNIT/ML Solostar Pen Inject 20-50 Units into the skin at bedtime as needed. Patient takes 20 units every night at bedtime if over 178.  Takes 50 units if her blood sugar is above 300.    . levETIRAcetam (KEPPRA) 250 MG tablet Take 1 tablet (250 mg total) by mouth 2 (two) times daily. 60 tablet 11  . levothyroxine (SYNTHROID, LEVOTHROID) 137 MCG tablet Take 137 mcg by mouth daily.    Marland Kitchen loperamide (IMODIUM) 2 MG capsule TAKE (1) CAPSULE THREE TIMES DAILY AS NEEDED FOR DIARRHEA OR LOOSE STOOLS. (Patient taking differently: TAKE 2 MG THREE TIMES DAILY AS NEEDED FOR DIARRHEA OR LOOSE STOOLS.) 30 capsule 1  . LORazepam (ATIVAN) 1 MG tablet Take 1 mg by mouth 3 (three) times daily as needed for anxiety or sleep. For anxiety    . magnesium oxide (MAG-OX) 400 MG tablet Take 400 mg by mouth daily.     . metaxalone (SKELAXIN) 800 MG tablet Take 800 mg by mouth 3 (three) times daily.     . metFORMIN (GLUCOPHAGE) 500 MG tablet Take 1,000 mg by mouth 2 (two) times daily with a meal.     . Multiple Vitamin (MULTIVITAMIN WITH MINERALS) TABS tablet Take 1 tablet by mouth daily.    Marland Kitchen omeprazole (PRILOSEC) 20 MG capsule Take 20 mg by mouth 2 (two) times daily.      . ondansetron (ZOFRAN) 4 MG tablet Take 4 mg by mouth every 6 (six) hours as needed. For nausea    . POLY-IRON 150 150 MG capsule TAKE 1 CAPSULE BY MOUTH 2 TIMES DAILY. (Patient taking differently: TAKE 150 MG BY MOUTH 2 TIMES DAILY.) 60 capsule 3  . potassium chloride (K-DUR,KLOR-CON) 10 MEQ tablet Take 20 mEq by mouth 2 (two) times daily.    . vitamin B-12 (CYANOCOBALAMIN) 500 MCG tablet Take 500 mcg by mouth daily.    . nitroGLYCERIN (NITROSTAT) 0.4 MG SL tablet Place 0.4 mg under the tongue every 5 (five) minutes as needed for chest pain. For chest pain      No current  facility-administered medications on file prior to visit.     PAST SURGICAL HISTORY Past Surgical History  Procedure Laterality Date  . Cardiovascular stress test  2008    Stress nuclear study  . Back surgery  1996  . Laparoscopic cholecystectomy  1990s  . Total abdominal hysterectomy  1999  . Laminectomy  1995    L4-L5  . Carpal tunnel release  1994  .  Esophagogastroduodenoscopy  2008    Barrett's without dysplasia. esphagus dilated. antral erosions, h.pylori serologies negative.  . Colonoscopy  11/2011    Dr. Oneida Alar: Internal hemorrhoids, mild diverticulosis. Random colon biopsies negative.  . Givens capsule study  12/07/2011    Proximal small bowel, rare AVM. Distal small bowel, multiple ulcers noted  . Esophagogastroduodenoscopy  11/2011    Dr. Oneida Alar: Barrett's esophagus, mild gastritis, diverticulum in the second portion of the duodenum repeat EGD 3 years. Small bowel biopsies negative. Gastric biopsy show reactive gastropathy but no H. pylori. Esophageal biopsies consistent with GERD. Next EGD 11/2014  . Givens capsule study N/A 09/27/2013    Distal small bowel ulcers extending to TI.  Marland Kitchen Givens capsule study N/A 10/10/2013    Procedure: GIVENS CAPSULE STUDY;  Surgeon: Danie Binder, MD;  Location: [facility];  Service: Endoscopy;  Laterality: N/A;  7:30  . Colonoscopy N/A 11/08/2013    SLF: Normal mucosa in the terminal ileum/The colon IS redundant/  Moderate diverticulosis throughout the entire colon. ileum bx benign. colon bx benign  . Esophageal biopsy N/A 11/08/2013    Procedure: BIOPSY  / Tissue sampling / ulcers present in small intestine;  Surgeon: Danie Binder, MD;  Location: [facility];  Service: Endoscopy;  Laterality: N/A;  . Cardiac catheterization  2002  . Right heart catheterization N/A 01/10/2014    Procedure: RIGHT HEART CATH;  Surgeon: Sinclair Grooms, MD;  Location: [facility] CATH LAB;  Service: Cardiovascular;  Laterality: N/A;  . Left heart catheterization with  coronary angiogram  01/10/2014    Procedure: LEFT HEART CATHETERIZATION WITH CORONARY ANGIOGRAM;  Surgeon: Sinclair Grooms, MD;  Location: [facility] CATH LAB;  Service: Cardiovascular;;  . Esophagogastroduodenoscopy N/A 11/21/2014    CZY:SAYT non-erosive gastritis/irregular z-line  . Cardiac catheterization N/A 01/26/2015    Procedure: Right Heart Cath;  Surgeon: Larey Dresser, MD;  Location: [place] CV LAB;  Service: Cardiovascular;  Laterality: N/A;  . Cardioversion N/A 03/06/2015    Procedure: CARDIOVERSION;  Surgeon: Larey Dresser, MD;  Location: [facility] ENDOSCOPY;  Service: Cardiovascular;  Laterality: N/A;    FAMILY HISTORY: Family History  Problem Relation Age of Onset  . Hypertension Mother   . Alzheimer's disease Mother   . Stroke Mother   . Heart attack Mother   . Heart disease Neg Hx   . Hypertension Other   . Colon cancer Neg Hx     SOCIAL HISTORY:  reports that she quit smoking about 31 years ago. She started smoking about 49 years ago. She has never used smokeless tobacco. She reports that she does not drink alcohol or use illicit drugs.  PERFORMANCE STATUS: The patient's performance status is 1 - Symptomatic but completely ambulatory  REVIEW OF SYMPTOMS: Positive for congestion. 14 point review of systems was performed and is negative except as detailed under history of present illness and above  PHYSICAL EXAM: Most Recent Vital Signs: Blood pressure 107/55, pulse 55, temperature 98.5 F (36.9 C), temperature source Oral, resp. rate 18, weight 172 lb (78.019 kg), SpO2 99 %. General appearance: alert, cooperative, appears stated age, fatigued, no distress and moderately obese Head: Normocephalic, without obvious abnormality, atraumatic Eyes: negative findings: lids and lashes normal, conjunctivae and sclerae normal, corneas clear and pupils equal, round, reactive to light and accomodation Throat: lips, mucosa, and tongue normal; teeth and gums normal Neck: no adenopathy,  no carotid bruit, no JVD and supple, symmetrical, trachea midline Lungs: clear to auscultation bilaterally  Heart: irregularly irregular rhythm Abdomen: abnormal findings:  hepatomegaly, liver edge palpable 5 cm below costal margin. No ascites on exam Extremities: extremities normal, atraumatic, no cyanosis or edema Skin: Skin color, texture, turgor normal. No rashes or lesions Lymph nodes: Cervical, supraclavicular, and axillary nodes normal. Neurologic: Grossly normal  LABORATORY DATA:  CBC    Component Value Date/Time   WBC 6.4 03/16/2015 1031   RBC 4.41 03/16/2015 1031   RBC 3.77* 06/30/2013 0318   HGB 11.9* 03/16/2015 1031   HCT 38.2 03/16/2015 1031   PLT 216 03/16/2015 1031   MCV 86.6 03/16/2015 1031   MCH 27.0 03/16/2015 1031   MCHC 31.2 03/16/2015 1031   RDW 20.9* 03/16/2015 1031   LYMPHSABS 1.9 03/16/2015 1031   MONOABS 0.5 03/16/2015 1031   EOSABS 0.1 03/16/2015 1031   BASOSABS 0.1 03/16/2015 1031      Chemistry      Component Value Date/Time   NA 137 03/06/2015 1113   K 4.9 03/06/2015 1113   CL 104 02/13/2015 1414   CO2 24 02/13/2015 1414   BUN 23 02/13/2015 1414   CREATININE 0.70 02/13/2015 1414   CREATININE 0.95 02/06/2015 0524      Component Value Date/Time   CALCIUM 8.9 02/13/2015 1414   CALCIUM 8.4 06/30/2013 0318   ALKPHOS 81 02/13/2015 1414   AST 12 02/13/2015 1414   ALT 7 02/13/2015 1414   BILITOT 0.5 02/13/2015 1414     Lab Results  Component Value Date   IRON 29 12/26/2014   TIBC 458* 12/26/2014   FERRITIN 87 01/19/2015   Lab Results  Component Value Date   FOLATE 46.2 12/26/2014   Lab Results  Component Value Date   VITAMINB12 3696* 12/26/2014    RADIOGRAPHY:  CLINICAL DATA: 64 year old diabetic hypertensive female with generalized abdominal pain and swelling for the past month. Nausea, diarrhea and constipation. On prior CT, history of hysterectomy, cholecystectomy and appendectomy. History of small-bowel ulcers. Initial  encounter.  EXAM: CT ABDOMEN AND PELVIS WITH CONTRAST  TECHNIQUE: Multidetector CT imaging of the abdomen and pelvis was performed using the standard protocol following bolus administration of intravenous contrast.  CONTRAST: 143m OMNIPAQUE IOHEXOL 300 MG/ML SOLN  COMPARISON: 09/03/2013 CT.  FINDINGS: Respiratory motion degraded examination.  Ascites. Third spacing of fluid. Etiology indeterminate but may be related to heart dysfunction. Prominent hepatic veins.  Cardiomegaly. Mitral valve calcifications.  Small plural effusions with basilar atelectasis.  Atherosclerotic type changes aorta and branch vessels most notable involving the proximal renal artery bilaterally.  Evaluation for extra luminal bowel inflammatory process is limited given the ascites. No extra luminal free air. Under distended stomach without obvious primary bowel abnormality.  Liver is enlarged spanning over 21 cm. No focal worrisome hepatic lesion. Post cholecystectomy.  No worrisome splenic, pancreatic, renal or adrenal abnormality. Post cholecystectomy.  No adenopathy. Slightly rounded left external iliac node unchanged.  Congenital fusion L2-3. Degenerative changes lower thoracic spine and lumbar spine and most notable L4-5 and L5-S1 level. No osseous destructive lesion.  Post hysterectomy. No adnexal mass identified.  Fatty containing anterior abdominal wall hernia.  IMPRESSION: Respiratory motion degraded examination.  Ascites. Third spacing of fluid. Etiology indeterminate but may be related to heart dysfunction. Prominent hepatic veins.  Cardiomegaly. Mitral valve calcifications.  Small plural effusions with basilar atelectasis.  Atherosclerotic type changes aorta and branch vessels most notable involving the proximal renal artery bilaterally.  Evaluation for extra luminal bowel inflammatory process is limited given the ascites. No extra luminal  free  air.  Liver is enlarged spanning over 21 cm.  These results will be called to the ordering clinician or representative by the Radiologist Assistant, and communication documented in the PACS or zVision Dashboard.   Electronically Signed  By: Genia Del M.D.  On: 08/25/2014 09:35     ASSESSMENT/PLAN:  1. Iron deficiency anemia, with ferrous sulfate PO failure.  Taking ferrous sulfate x 6 months.  No improvement in iron studies or Hgb.  S/P extensive GI work-up without any obvious source of iron deficiency anemia. 2. Hepatomegaly 3. Chronic diastolic CHF  She seems to be doing better. She certainly looks better. She has undergone cardioversion with good results thus far. I have recommended ongoing monitoring of counts and iron levels. We will keep her apprised of the results of her laboratory studies once her iron comes back today. If she needs additional IV replacement we will let her know. I will tentatively schedule her for 6 month follow-up. If she needs iron replacement we will check labs prior to that next visit.  I have scheduled Ms. Peer for a routine mammogram. She would prefer it to be on a Friday.  All questions were answered. The patient knows to call the clinic with any problems, questions or concerns. We can certainly see the patient much sooner if necessary.  This document serves as a record of services personally performed by Ancil Linsey, MD. It was created on her behalf by Arlyce Harman, a trained medical scribe. The creation of this record is based on the scribe's personal observations and the provider's statements to them. This document has been checked and approved by the attending provider.  I have reviewed the above documentation for accuracy and completeness, and I agree with the above.  This note is electronically signed by: Carlis Abbott 03/16/2015 11:07 AM

## 2015-03-16 NOTE — Patient Instructions (Signed)
..  [facility] at [facility] Discharge Instructions  RECOMMENDATIONS MADE BY THE CONSULTANT AND ANY TEST RESULTS WILL BE SENT TO YOUR REFERRING PHYSICIAN.  Exam per Dr. Penland Return in 3 months  Thank you for choosing [facility] at [facility] to provide your oncology and hematology care.  To afford each patient quality time with our provider, please arrive at least 15 minutes before your scheduled appointment time.    You need to re-schedule your appointment should you arrive 10 or more minutes late.  We strive to give you quality time with our providers, and arriving late affects you and other patients whose appointments are after yours.  Also, if you no show three or more times for appointments you may be dismissed from the clinic at the providers discretion.     Again, thank you for choosing [facility].  Our hope is that these requests will decrease the amount of time that you wait before being seen by our physicians.       _____________________________________________________________  Should you have questions after your visit to [facility], please contact our office at [phone] between the hours of 8:30 a.m. and 4:30 p.m.  Voicemails left after 4:30 p.m. will not be returned until the following business day.  For prescription refill requests, have your pharmacy contact our office.    

## 2015-03-17 ENCOUNTER — Other Ambulatory Visit (HOSPITAL_COMMUNITY): Payer: Self-pay | Admitting: Hematology & Oncology

## 2015-03-19 DIAGNOSIS — M25571 Pain in right ankle and joints of right foot: Secondary | ICD-10-CM | POA: Diagnosis not present

## 2015-03-19 DIAGNOSIS — I5032 Chronic diastolic (congestive) heart failure: Secondary | ICD-10-CM | POA: Diagnosis not present

## 2015-03-20 ENCOUNTER — Ambulatory Visit (HOSPITAL_COMMUNITY)
Admission: RE | Admit: 2015-03-20 | Discharge: 2015-03-20 | Disposition: A | Discharge status: Home / Self Care | Payer: Medicare Other | Source: Ambulatory Visit | Attending: Cardiology | Admitting: Cardiology

## 2015-03-20 VITALS — BP 118/62 | HR 66 | Wt 174.0 lb

## 2015-03-20 DIAGNOSIS — E119 Type 2 diabetes mellitus without complications: Secondary | ICD-10-CM | POA: Diagnosis not present

## 2015-03-20 DIAGNOSIS — Z87891 Personal history of nicotine dependence: Secondary | ICD-10-CM | POA: Diagnosis not present

## 2015-03-20 DIAGNOSIS — G4733 Obstructive sleep apnea (adult) (pediatric): Secondary | ICD-10-CM | POA: Insufficient documentation

## 2015-03-20 DIAGNOSIS — I5032 Chronic diastolic (congestive) heart failure: Secondary | ICD-10-CM

## 2015-03-20 DIAGNOSIS — Z794 Long term (current) use of insulin: Secondary | ICD-10-CM | POA: Insufficient documentation

## 2015-03-20 DIAGNOSIS — Z79899 Other long term (current) drug therapy: Secondary | ICD-10-CM | POA: Insufficient documentation

## 2015-03-20 DIAGNOSIS — Z7902 Long term (current) use of antithrombotics/antiplatelets: Secondary | ICD-10-CM | POA: Insufficient documentation

## 2015-03-20 DIAGNOSIS — I480 Paroxysmal atrial fibrillation: Secondary | ICD-10-CM | POA: Insufficient documentation

## 2015-03-20 DIAGNOSIS — K219 Gastro-esophageal reflux disease without esophagitis: Secondary | ICD-10-CM | POA: Diagnosis not present

## 2015-03-20 DIAGNOSIS — Z8249 Family history of ischemic heart disease and other diseases of the circulatory system: Secondary | ICD-10-CM | POA: Insufficient documentation

## 2015-03-20 DIAGNOSIS — E039 Hypothyroidism, unspecified: Secondary | ICD-10-CM | POA: Insufficient documentation

## 2015-03-20 LAB — BASIC METABOLIC PANEL
Anion gap: 9 (ref 5–15)
BUN: 18 mg/dL (ref 6–20)
CHLORIDE: 105 mmol/L (ref 101–111)
CO2: 23 mmol/L (ref 22–32)
Calcium: 9.2 mg/dL (ref 8.9–10.3)
Creatinine, Ser: 0.85 mg/dL (ref 0.44–1.00)
GFR calc Af Amer: >60 mL/min (ref >60)
GFR calc non Af Amer: >60 mL/min (ref >60)
GLUCOSE: 120 mg/dL — AB (ref 65–99)
Potassium: 5 mmol/L (ref 3.5–5.1)
SODIUM: 137 mmol/L (ref 135–145)

## 2015-03-20 MED ORDER — AMIODARONE HCL 200 MG PO TABS: 200.0000 mg | ORAL_TABLET | Freq: Every day | ORAL | Status: DC

## 2015-03-20 NOTE — Patient Instructions (Signed)
DECREASE Amiodarone to 200mg  (1 tablet) daily.  DO NOT TAKE LASIX if weight is less than 168lbs.  Your provider requests you have a Pulmonary Functions Test.  Routine lab work today. (bmet) Will notify you of abnormal results  FOLLOW UP: 2 months

## 2015-03-20 NOTE — Progress Notes (Signed)
Advanced Heart Failure Medication Review by a Pharmacist  Does the patient  feel that his/her medications are working for him/her?  yes  Has the patient been experiencing any side effects to the medications prescribed?  no  Does the patient measure his/her own blood pressure or blood glucose at home?  yes   Does the patient have any problems obtaining medications due to transportation or finances?   no  Understanding of regimen: good Understanding of indications: good Potential of compliance: good    Pharmacist comments:  [name] is a pleasant 65 yo F presenting with her daughter who manages her medications. They report good compliance with her medications except that she misses ~3-4 days of her evening doses. They did not have any specific medication-related questions or concerns for me at this time.   Tyler Deis. Bonnye Fava, PharmD, BCPS, CPP Clinical Pharmacist Pager: [phone] Phone: [phone] 03/20/2015 12:11 PM

## 2015-03-21 NOTE — Progress Notes (Signed)
Patient ID: [name], female   DOB: 12-08-1950, 64 y.o.   MRN: [id number]  Advanced HF Clinic Note  Patient ID: [name], female   DOB: 03/14/1951, 64 y.o.   MRN: [id number] PCP: Dr. [name] Primary cardiologist: Dr Wyline Mood HF: Dr Shirlee Latch  64 yo with history of chronic atrial fibrillation and chronic diastolic CHF presents for cardiology followup.  She had developed progressive dyspnea and volume overload/weight gain over several months.  I brought her in for RHC in 7/16 showing markedly elevated right and left heart filling pressures and primarily pulmonary venous hypertension.  I admitted her and diuresed weight with weight falling significantly.  Echo showed preserved LV systolic function.  She was sent home on Lasix 60 mg bid.  She was re-admitted after standing up rapidly and passing out.  She sprained her right ankle.  Lasix was cut back to 40 mg daily and lisinopril and Coreg were stopped.    She presents today for HF follow up.  At last visit we increased Lasix to 40 qam/ 20 qpm. She underwent cardioversion 9/2. Has felt great since. No palpitations/tachypalpitations.  Weights at home trended down to 170s currently.  Held lasix for 2 days recently with weights of 168 and dizziness. Feels better with weight 170 or above.  Breathing has been good, No dyspnea. Finished Home PT. Ferritin was low at recent heme appointment so scheduled for IV iron next 2 Mondays.  ECG: Sinus brady, LAFB  Labs (8/16): K 4.7, creatinine 0.7, LFTs normal, TSH normal, HCT 32.2 Labs (9/16): HCT 38.2  PMH: 1. Atrial fibrillation: Paroxysmal.  DCCV to NSR 9/16.  2. Chronic diastolic CHF: EF was previously down to 40%, but most recent echo in 7/16 showed EF 55-60%, moderate LVH, mild MR, severe biatrial enlargement, PA systolic pressure 46 mmHg.  RHC (7/16) with mean RA 23, PA 70/36 mean 52, mean PCWP 31, CI 2.32, PVR 4.6 WU.  3. GERD with Barretts esophagus 4. Hypothyroidism 5. OSA on CPAP 6. Type II  diabetes 7. IBS  SH: Married, retired (used to work at [employer]).  Daughter brings to appts.  Lives in [place].  Prior smoker, quit 1984.  FH: Mother with MI  ROS: All systems reviewed and negative except as per HPI.   Current Outpatient Prescriptions  Medication Sig Dispense Refill  . acetaminophen (TYLENOL) 500 MG tablet Take 500 mg by mouth every 6 (six) hours as needed for moderate pain or headache.    Marland Kitchen amiodarone (PACERONE) 200 MG tablet Take 1 tablet (200 mg total) by mouth daily. 30 tablet 6  . apixaban (ELIQUIS) 5 MG TABS tablet Take 5 mg by mouth 2 (two) times daily.    Marland Kitchen dicyclomine (BENTYL) 10 MG capsule Take 10 mg by mouth 2 (two) times daily as needed for spasms.    Marland Kitchen dronabinol (MARINOL) 2.5 MG capsule Take 1 capsule (2.5 mg total) by mouth 2 (two) times daily before a meal. 60 capsule 0  . DULoxetine (CYMBALTA) 30 MG capsule Take 30 mg by mouth 2 (two) times daily.    . furosemide (LASIX) 40 MG tablet Take 20-40 mg by mouth 2 (two) times daily. Take 40 mg (1 tablet) in the morning and 20 mg (1/2 tablet) in the afternoon . Do not take if weight is less that 168lbs     . glipiZIDE (GLUCOTROL) 10 MG tablet Take 10 mg by mouth daily before breakfast.     . Insulin Glargine (LANTUS SOLOSTAR) 100 UNIT/ML Solostar  Pen Inject 20-50 Units into the skin at bedtime as needed. Patient takes 20 units every night at bedtime if over 178.  Takes 50 units if her blood sugar is above 300.    . iron polysaccharides (NIFEREX) 150 MG capsule Take 150 mg by mouth 2 (two) times daily.    Marland Kitchen levETIRAcetam (KEPPRA) 250 MG tablet Take 1 tablet (250 mg total) by mouth 2 (two) times daily. 60 tablet 11  . levothyroxine (SYNTHROID, LEVOTHROID) 137 MCG tablet Take 137 mcg by mouth daily.    Marland Kitchen loperamide (IMODIUM) 2 MG capsule Take 2 mg by mouth 3 (three) times daily as needed for diarrhea or loose stools.    Marland Kitchen LORazepam (ATIVAN) 1 MG tablet Take 1 mg by mouth 3 (three) times daily as needed for anxiety  or sleep. For anxiety    . magnesium oxide (MAG-OX) 400 MG tablet Take 400 mg by mouth daily.     . metaxalone (SKELAXIN) 800 MG tablet Take 800 mg by mouth 3 (three) times daily.     . metFORMIN (GLUCOPHAGE) 500 MG tablet Take 1,000 mg by mouth 2 (two) times daily with a meal.     . Multiple Vitamin (MULTIVITAMIN WITH MINERALS) TABS tablet Take 1 tablet by mouth daily.    Marland Kitchen omeprazole (PRILOSEC) 20 MG capsule Take 20 mg by mouth 2 (two) times daily.      . ondansetron (ZOFRAN) 4 MG tablet Take 4 mg by mouth every 6 (six) hours as needed. For nausea    . potassium chloride (K-DUR,KLOR-CON) 10 MEQ tablet Take 20 mEq by mouth 2 (two) times daily.    . vitamin B-12 (CYANOCOBALAMIN) 500 MCG tablet Take 500 mcg by mouth daily.    . nitroGLYCERIN (NITROSTAT) 0.4 MG SL tablet Place 0.4 mg under the tongue every 5 (five) minutes as needed for chest pain. For chest pain      No current facility-administered medications for this encounter.   BP 118/62 mmHg  Pulse 66  Wt 174 lb (78.926 kg)  SpO2 97% General: NAD Neck: JVP 7 cm, no thyromegaly or thyroid nodule.  Lungs: Clear to auscultation bilaterally with normal respiratory effort. CV: Nondisplaced PMI.  Heart irregular S1/S2, no S3/S4, no murmur.  Trace ankle edema.  No carotid bruit.  Normal pedal pulses.  Abdomen: Soft, nontender, mild distention, no HSM,   Skin: Intact without lesions or rashes.  Neurologic: Alert and oriented x 3.  Psych: Normal affect. Extremities: No clubbing or cyanosis. R ankle brace. HEENT: Normal.   Assessment/Plan: 1. Chronic diastolic CHF: Significant CHF in the setting of long-standing atrial fibrillation.  Now back in NSR after cardioversion and doing better, NYHA class II.  She is not volume overloaded on exam.  - Continue Lasix to 40 qam/20 qpm, Hold for weights < 169. - BMET today 2. Atrial fibrillation: Paroxysmal, holding NSR on amiodarone..  - Continue apixaban.  - Decrease amiodarone to 200 mg daily.   Will need regular eye exam, serial LFTs and TSH, and baseline PFTs.  Will order the PFTs today.   Maxine Glenn PA-C 03/21/2015   Patient seen with PA, agree with the above note.  She is doing much better back in NSR.  She is not volume overloaded on exam.  She will continue current Lasix but will hold if weight is < 169 lbs.  She can decrease amiodarone to 200 mg daily.  So far, holding NSR.    Marca Ancona 03/21/2015  11:40 PM

## 2015-03-23 ENCOUNTER — Encounter (HOSPITAL_BASED_OUTPATIENT_CLINIC_OR_DEPARTMENT_OTHER): Payer: Medicare Other

## 2015-03-23 VITALS — BP 126/63 | HR 62 | Temp 98.1°F | Resp 16

## 2015-03-23 DIAGNOSIS — D509 Iron deficiency anemia, unspecified: Secondary | ICD-10-CM

## 2015-03-23 MED ORDER — SODIUM CHLORIDE 0.9 % IV SOLN
125.0000 mg | Freq: Once | INTRAVENOUS | Status: DC
  Filled 2015-03-23: qty 10

## 2015-03-23 MED ORDER — SODIUM CHLORIDE 0.9 % IV SOLN
125.0000 mg | Freq: Once | INTRAVENOUS | Status: AC
  Administered 2015-03-23: 125 mg via INTRAVENOUS
  Filled 2015-03-23: qty 10

## 2015-03-23 MED ORDER — SODIUM CHLORIDE 0.9 % IV SOLN
INTRAVENOUS | Status: DC
  Administered 2015-03-23: 13:00:00 via INTRAVENOUS

## 2015-03-23 MED ORDER — SODIUM CHLORIDE 0.9 % IJ SOLN
10.0000 mL | Freq: Once | INTRAMUSCULAR | Status: AC
  Administered 2015-03-23: 10 mL via INTRAVENOUS

## 2015-03-23 MED ORDER — SODIUM CHLORIDE 0.9 % IV SOLN: INTRAVENOUS | Status: DC

## 2015-03-23 NOTE — Progress Notes (Signed)
Tolerated iron infusion well. 

## 2015-03-23 NOTE — Patient Instructions (Signed)
[facility] at [facility] Discharge Instructions  RECOMMENDATIONS MADE BY THE CONSULTANT AND ANY TEST RESULTS WILL BE SENT TO YOUR REFERRING PHYSICIAN.  Today you received ferric gluconate 125 mg iron infusion.  Return as scheduled.  Thank you for choosing [facility] at [facility] to provide your oncology and hematology care.  To afford each patient quality time with our provider, please arrive at least 15 minutes before your scheduled appointment time.    You need to re-schedule your appointment should you arrive 10 or more minutes late.  We strive to give you quality time with our providers, and arriving late affects you and other patients whose appointments are after yours.  Also, if you no show three or more times for appointments you may be dismissed from the clinic at the providers discretion.     Again, thank you for choosing [facility].  Our hope is that these requests will decrease the amount of time that you wait before being seen by our physicians.       _____________________________________________________________  Should you have questions after your visit to [facility], please contact our office at [phone] between the hours of 8:30 a.m. and 4:30 p.m.  Voicemails left after 4:30 p.m. will not be returned until the following business day.  For prescription refill requests, have your pharmacy contact our office.    

## 2015-03-26 IMAGING — MR MR HEAD W/O CM
7 of 9 series · 29 of 48 positions shown · non-contrast
Comparison: CT head 10/04/2012.  Also MR head 05/30/2009.

CLINICAL DATA: Near syncope.  Gait disturbance.

MRI HEAD WITHOUT CONTRAST
TECHNIQUE: Multiplanar, multiecho pulse sequences of the brain and
surrounding structures were obtained according to standard protocol
without intravenous contrast.

[Series 2: T1 · sagittal · 5.0mm · 0.33mm/px · 4 of 17 slices shown (1 of 2)]
[im 1/17]
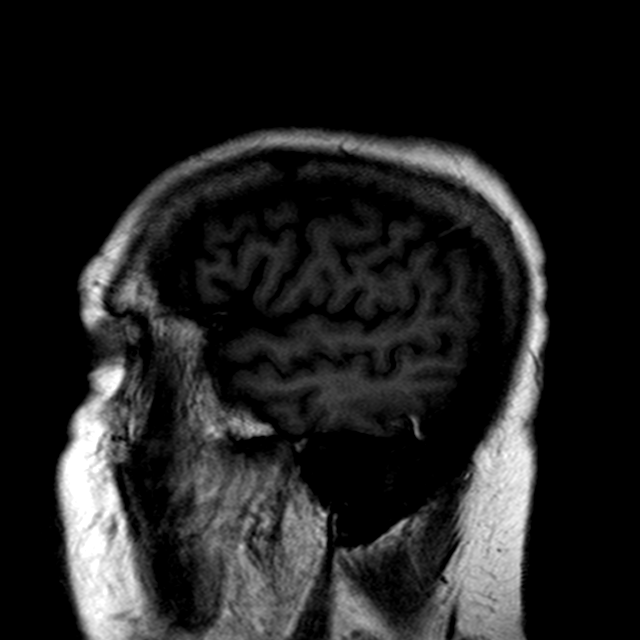
[im 6/17]
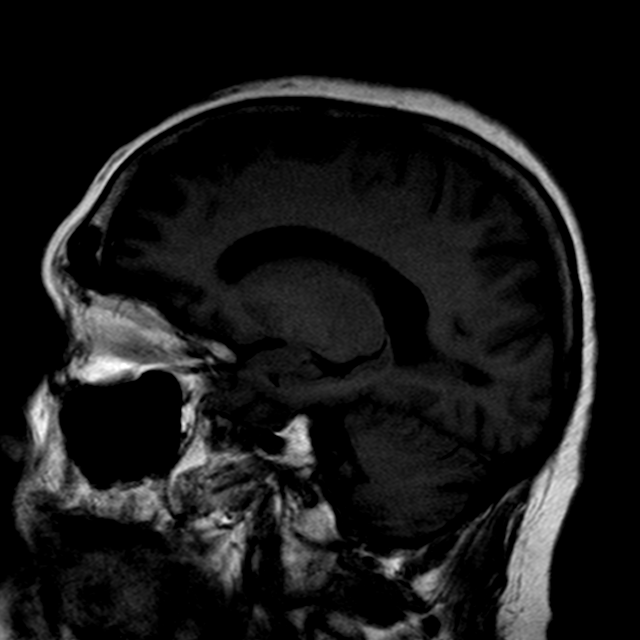
[im 11/17]
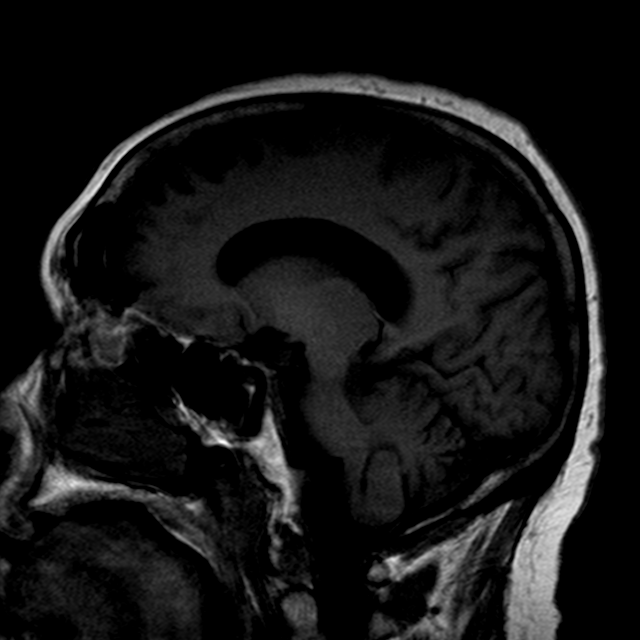
[im 17/17]
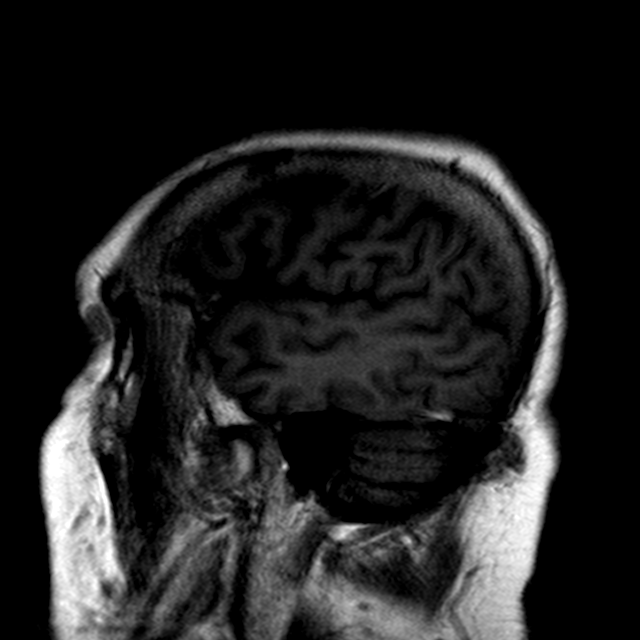

[Series 3: DWI · axial · 5.0mm · 0.63mm/px · z∈[-53,+80]mm · 4 of 22 slices shown (1 of 2)]
[im 1/22]
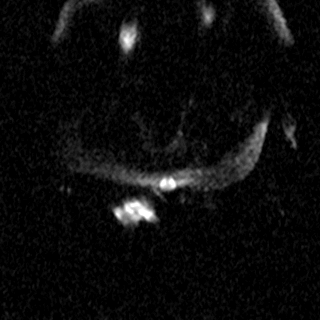
[im 8/22]
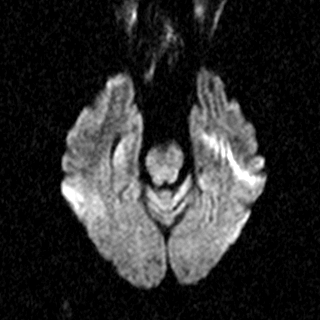
[im 15/22]
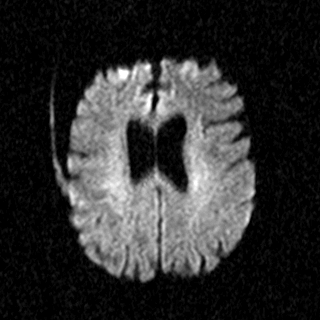
[im 22/22]
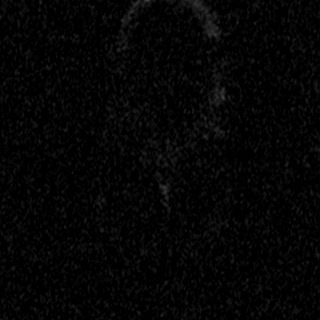

[Series 4: DWI · axial · 5.0mm · 0.66mm/px · z∈[-53,+81]mm · 4 of 22 slices shown (2 of 2)]
[im 1/22]
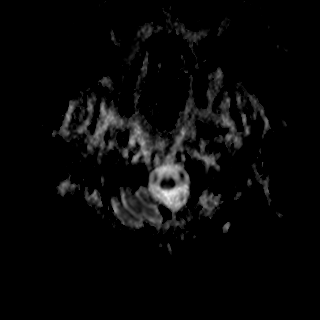
[im 8/22]
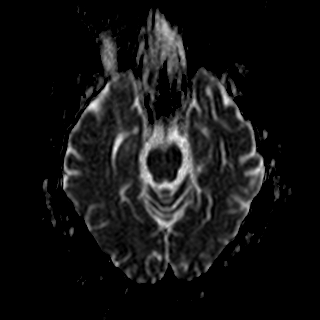
[im 15/22]
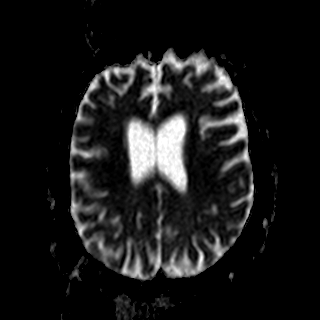
[im 22/22]
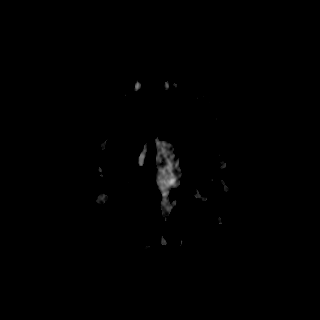

[Series 5: FLAIR · axial · 5.0mm · 0.56mm/px · z∈[-59,+88]mm · 5 of 26 slices shown]
[im 1/26]
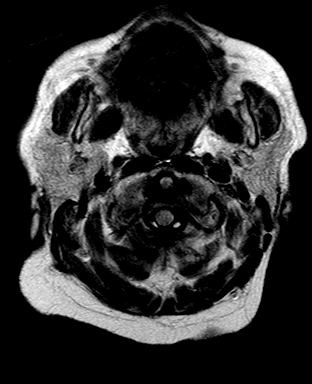
[im 7/26]
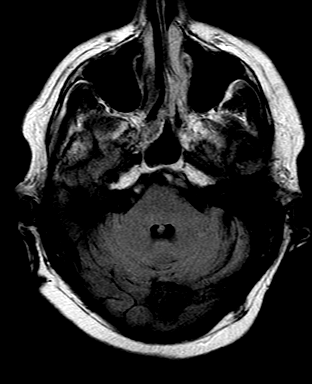
[im 13/26]
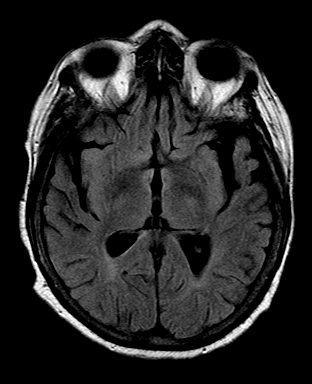
[im 19/26]
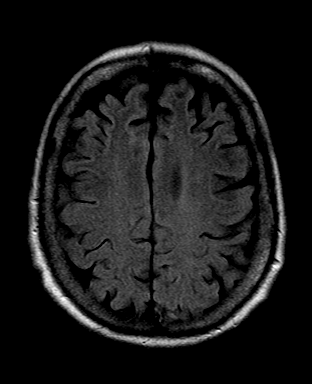
[im 26/26]
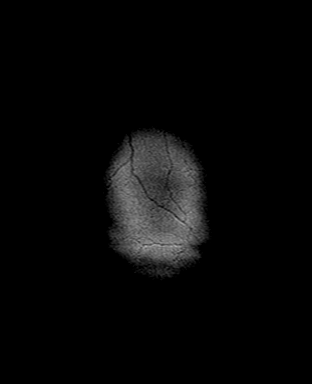

[Series 6: T2 · axial · 5.0mm · 0.53mm/px · z∈[-54,+82]mm · 4 of 24 slices shown (1 of 2)]
[im 1/24]
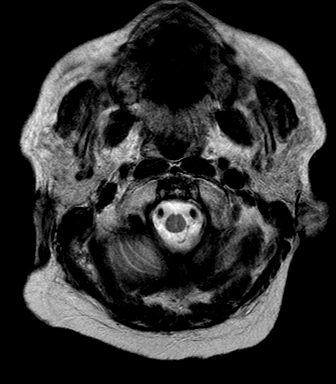
[im 8/24]
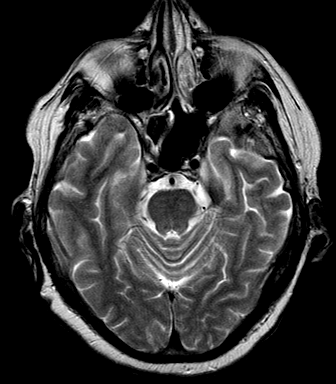
[im 16/24]
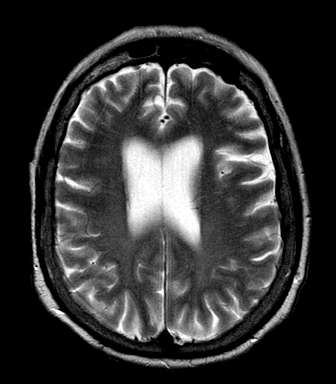
[im 24/24]
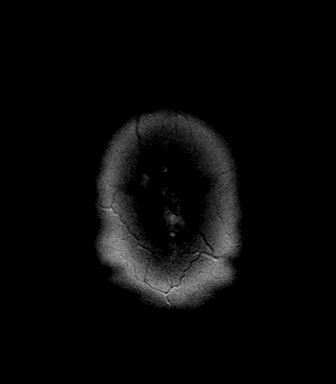

[Series 7: T1 · axial · 2.0mm · 0.55mm/px · z∈[-63,+8]mm · 4 of 80 slices shown (2 of 2)]
[im 1/80]
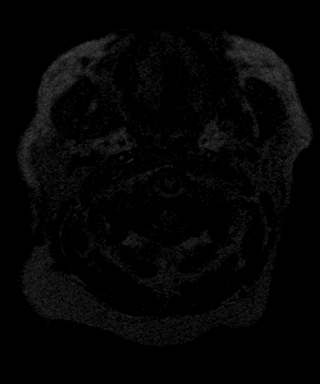
[im 13/80]
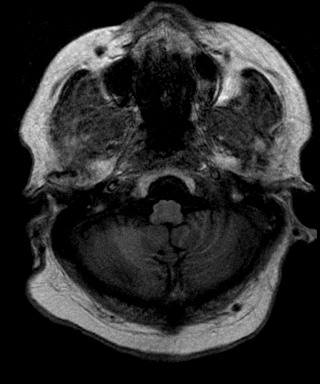
[im 25/80]
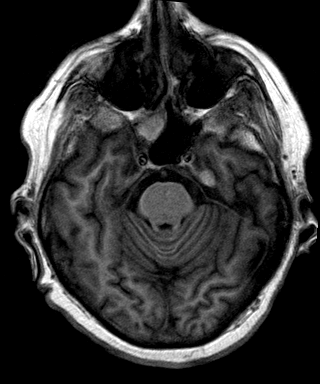
[im 37/80]
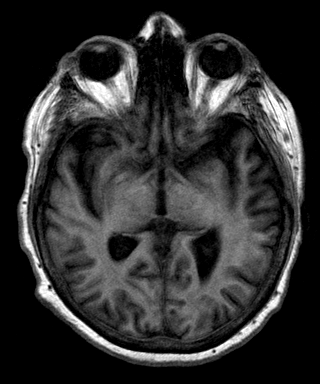

[Series 8: T2 · axial · 5.0mm · 0.41mm/px · z∈[-55,+81]mm · 4 of 24 slices shown (2 of 2)]
[im 1/24]
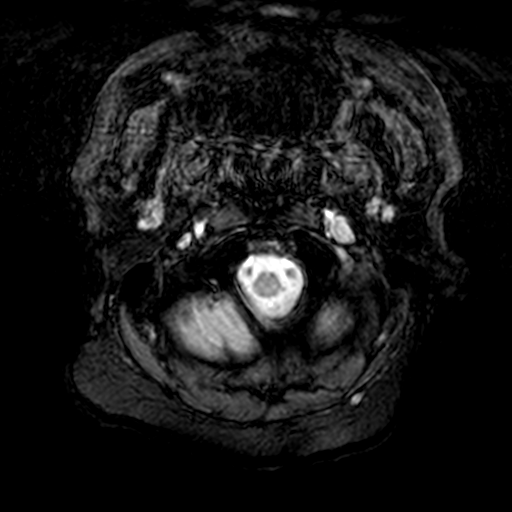
[im 8/24]
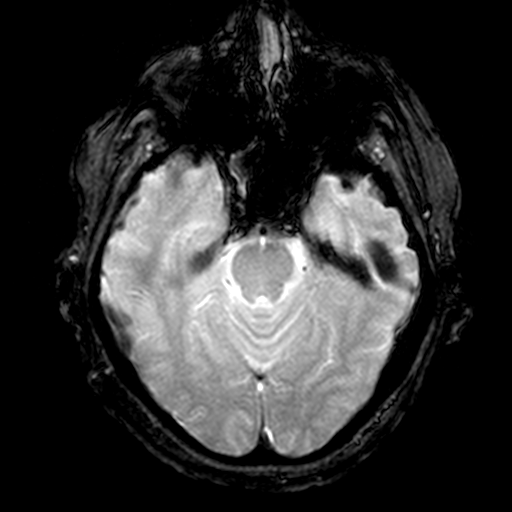
[im 16/24]
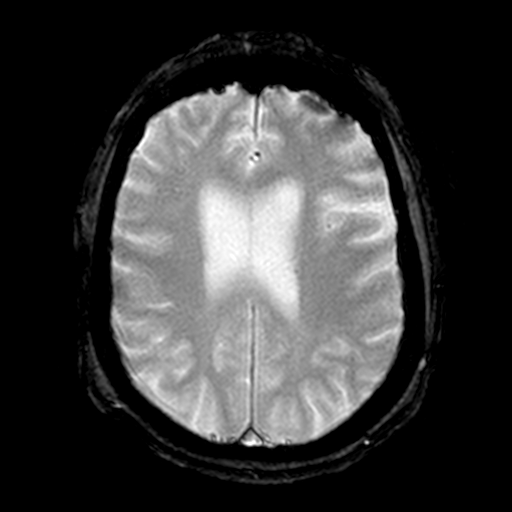
[im 24/24]
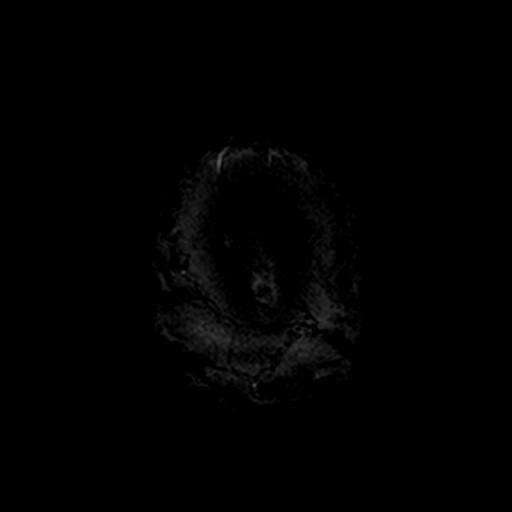

[29 of 48 positions shown; findings below may reference images not displayed]

FINDINGS: There is no evidence for acute infarction, intracranial
hemorrhage, mass lesion, hydrocephalus, or extra-axial fluid. Mild
atrophy is present.  There is mild chronic microvascular ischemic
change in the periventricular and subcortical white matter.  Normal
pituitary and cerebellar tonsils.  There are no foci of chronic
hemorrhage.  Calvarium and skull base appear intact.  Flow voids
are maintained in the internal carotid arteries and basilar artery.
There is no acute sinus or mastoid disease. Chronic right sphenoid
sinus disease remains, similar to priors.    Upper cervical region
unremarkable.

Compared with MR from 0474, there is mild progression of small
vessel disease.
IMPRESSION: Mild atrophy and chronic microvascular ischemic change.  No acute
stroke is evident.

## 2015-03-26 IMAGING — MR MR MRA HEAD W/O CM
1 series · 16 of 48 positions shown · non-contrast
Comparison: MRI brain performed concurrently.

CLINICAL DATA: Dizziness with atrial fibrillation and gait
difficulty.

MRA HEAD WITHOUT CONTRAST
TECHNIQUE: Angiographic images of the Circle of Willis were
obtained using MRA technique without intravenous contrast.

[Series 2: MRA · axial · 0.9mm · 0.32mm/px · z∈[-61,+37]mm · 16 of 118 slices shown]
[im 1/118]
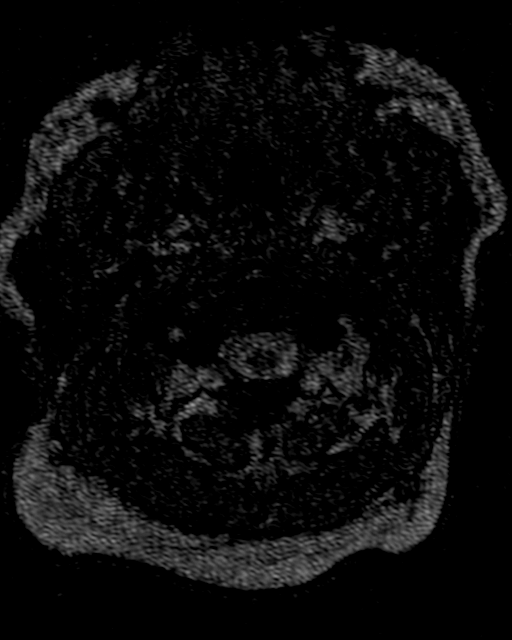
[im 3/118]
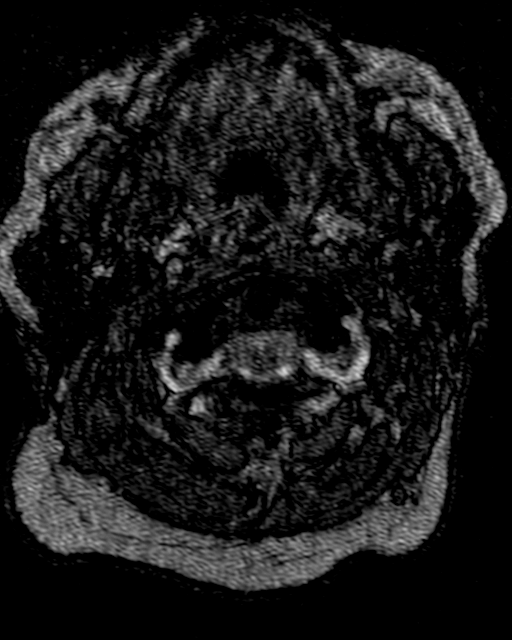
[im 5/118]
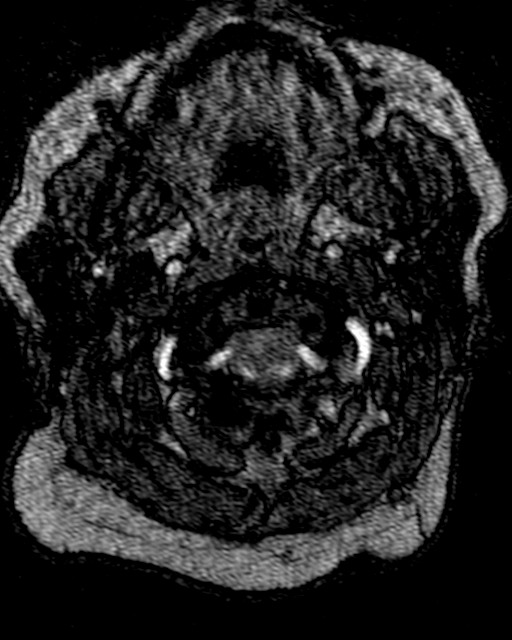
[im 8/118]
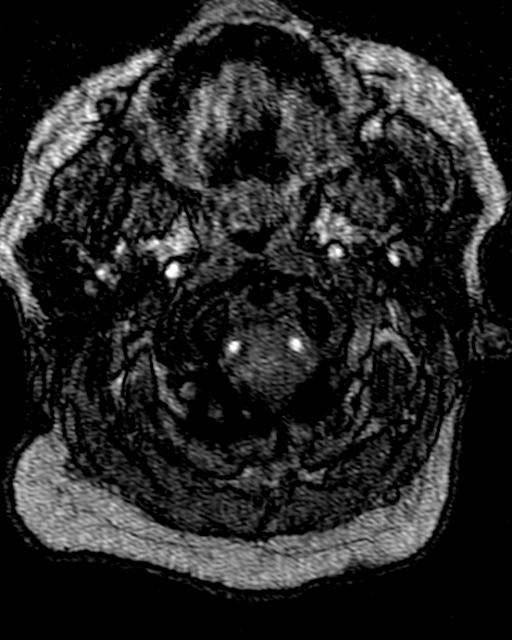
[im 10/118]
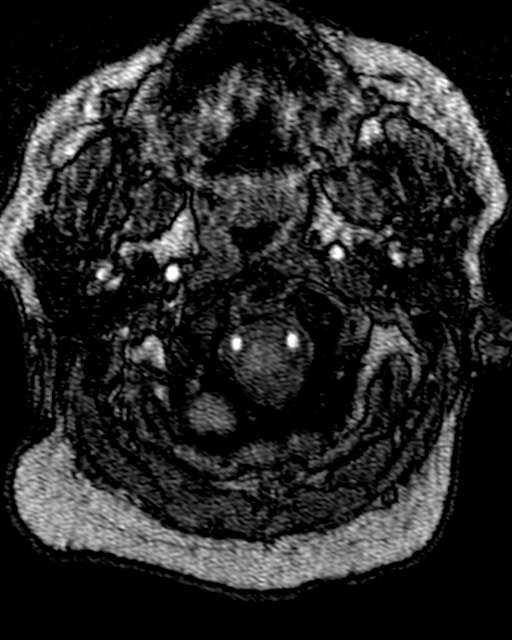
[im 13/118]
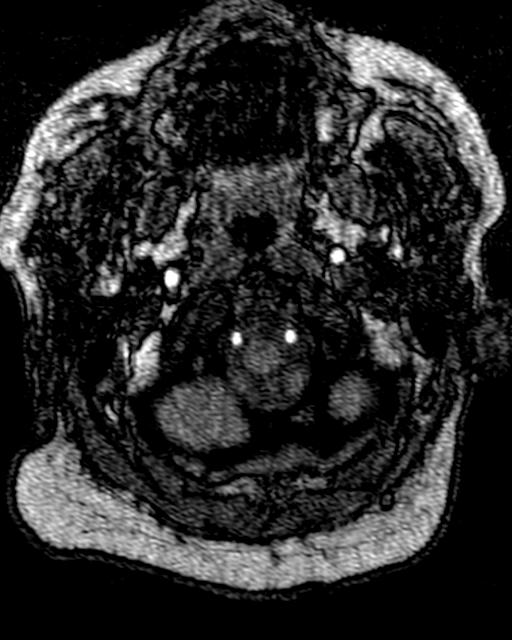
[im 20/118]
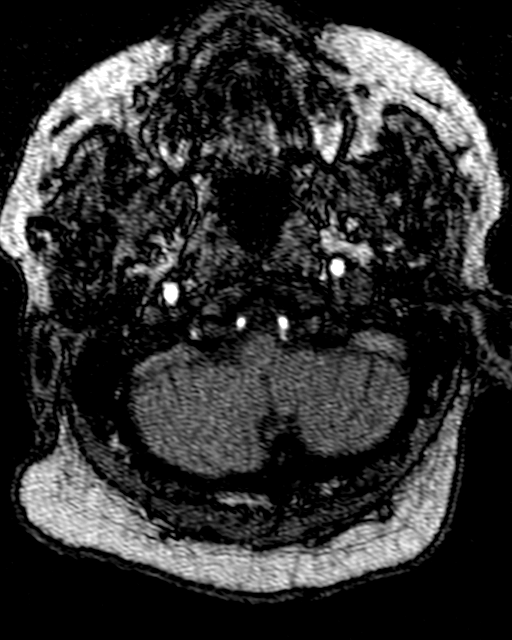
[im 23/118]
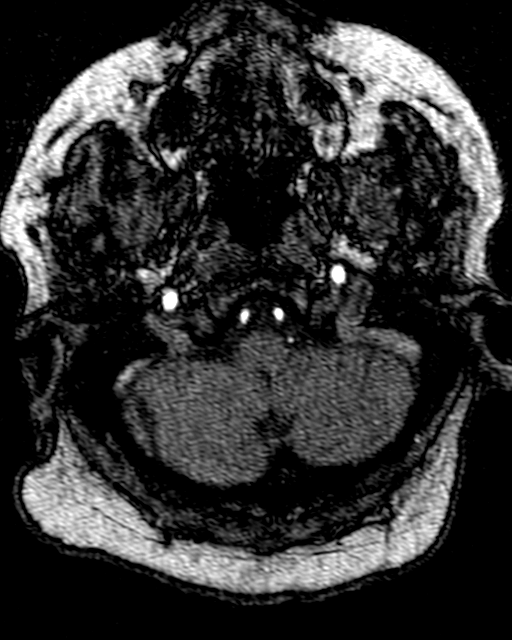
[im 38/118]
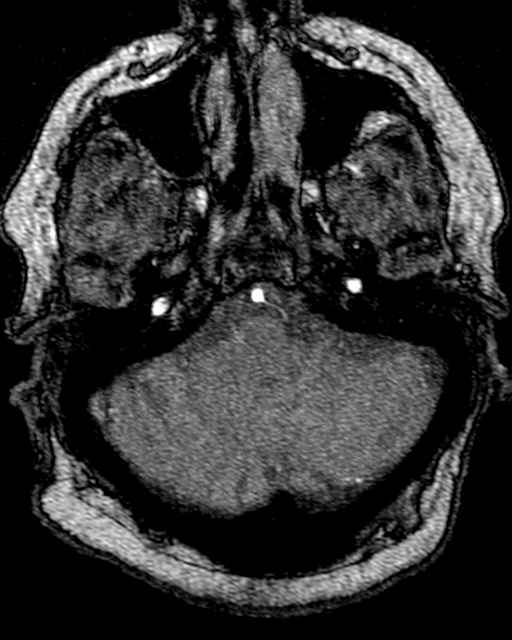
[im 53/118]
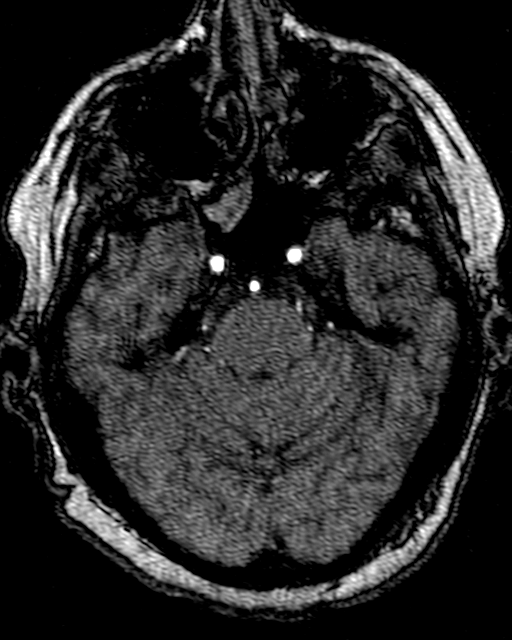
[im 60/118]
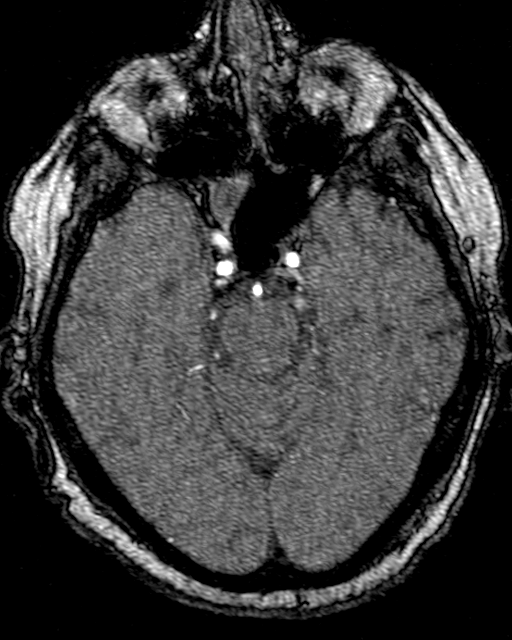
[im 68/118]
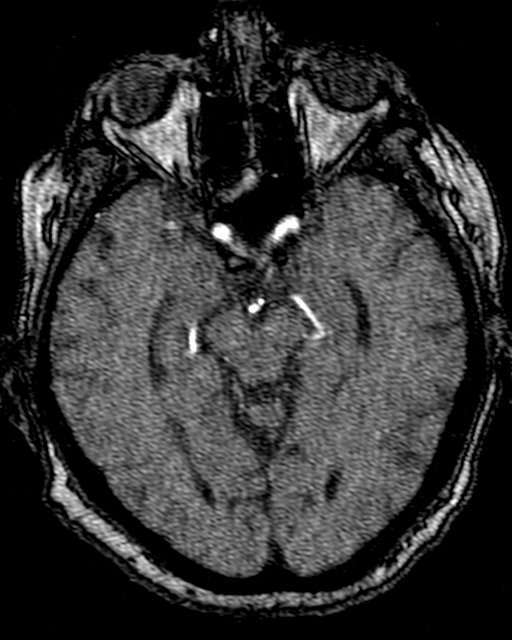
[im 83/118]
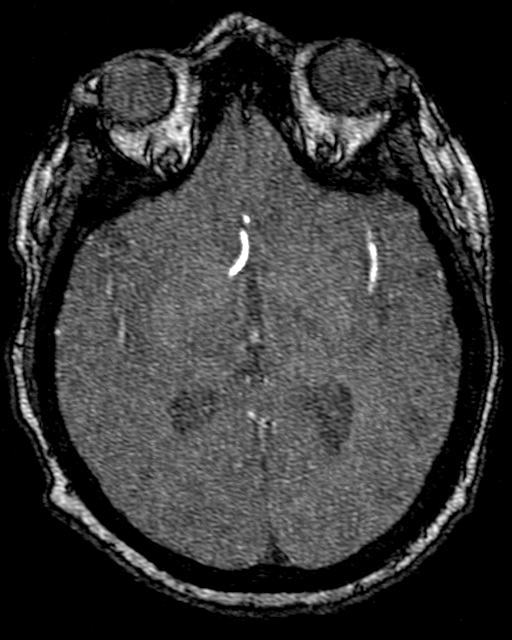
[im 98/118]
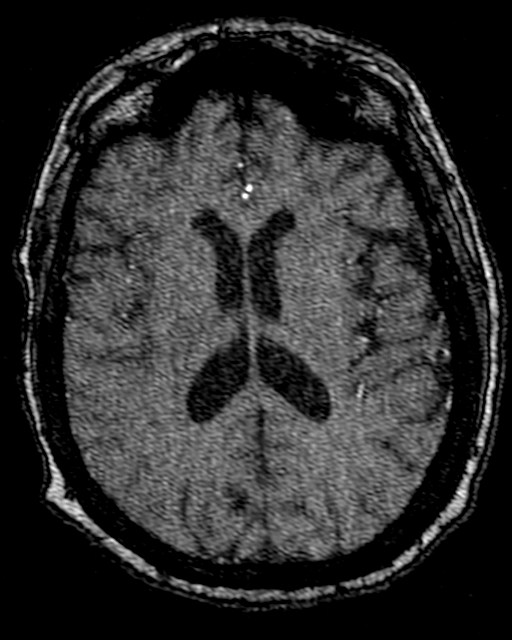
[im 100/118]
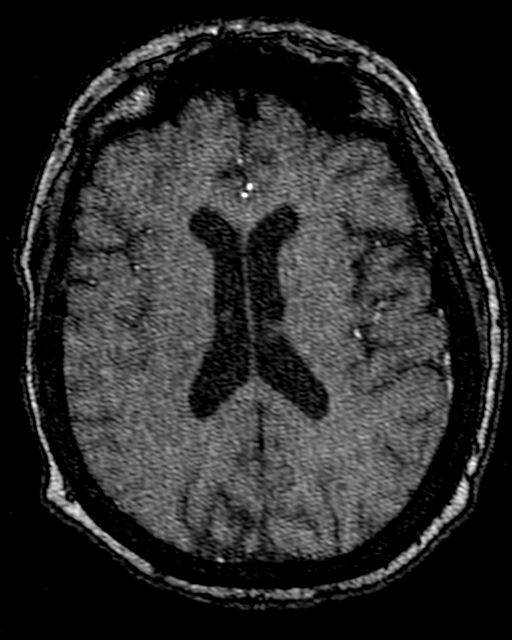
[im 113/118]
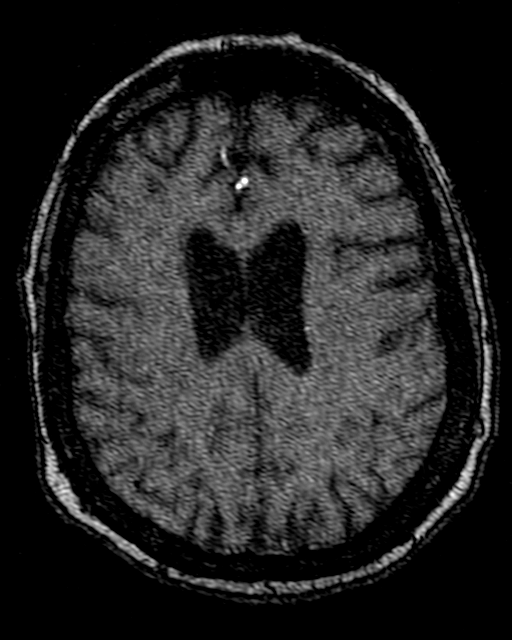

[16 of 48 positions shown; findings below may reference images not displayed]

FINDINGS: There is mild nonstenotic irregularity of the
supraclinoid internal carotid artery on the right.  There is a
focal 75 percent stenosis of the left internal carotid artery
supraclinoid segment.  Both ICA termini are unremarkable.  Basilar
artery widely patent with left vertebral slightly larger.  No
distal vertebral stenosis.   The proximal anterior, middle, and
posterior cerebral arteries appear widely patent. There is moderate
irregularity of the distal MCA and PCA branches consistent with
intracranial atherosclerotic change.  Both distal PCAs are poorly
visualized.  There is no cerebellar branch occlusion.  No
intracranial aneurysm is seen.
IMPRESSION: No evidence for proximal vertebral or basilar stenosis.

Moderate irregularity distal MCA and PCA branches consistent with
intracranial atherosclerotic change.

Potentially flow reducing stenosis supraclinoid left internal
carotid artery, estimated to be 75%.

## 2015-03-26 IMAGING — US US CAROTID DUPLEX BILAT
1 series · 13 of 24 positions shown · non-contrast
Comparison: None.

CLINICAL DATA: Stroke symptoms, hypertension

BILATERAL CAROTID DUPLEX ULTRASOUND
TECHNIQUE: Gray scale imaging, color Doppler and duplex ultrasound
was performed of bilateral carotid and vertebral arteries in the
neck.

[Series 1: us carotid duplex bilat · 0.07mm/px · 13 of 68 slices shown]
[im 1/68]
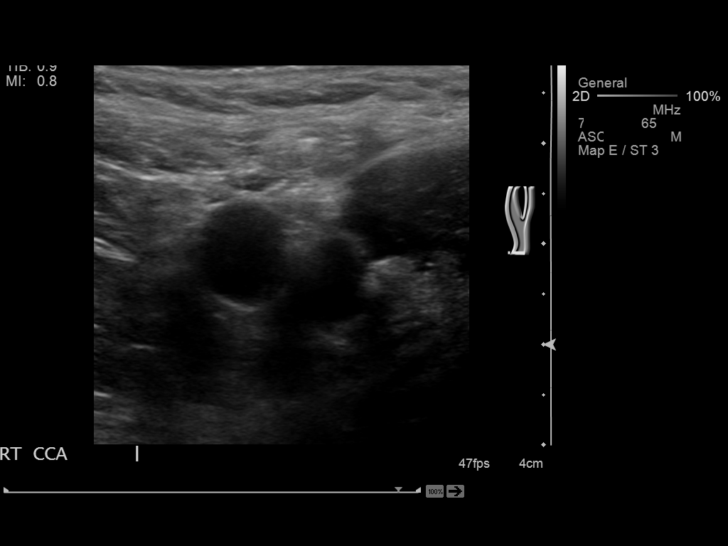
[im 6/68]
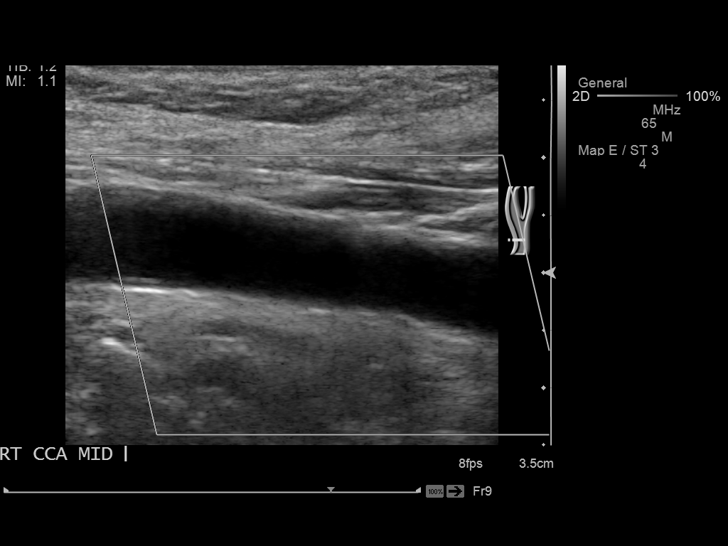
[im 12/68]
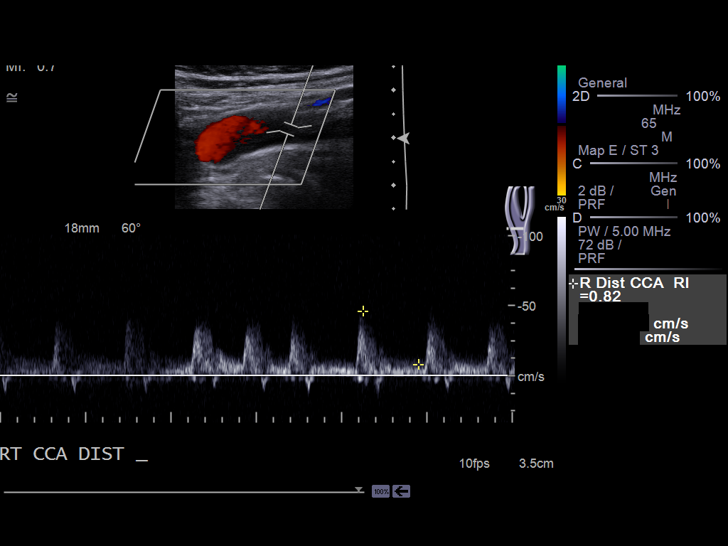
[im 18/68]
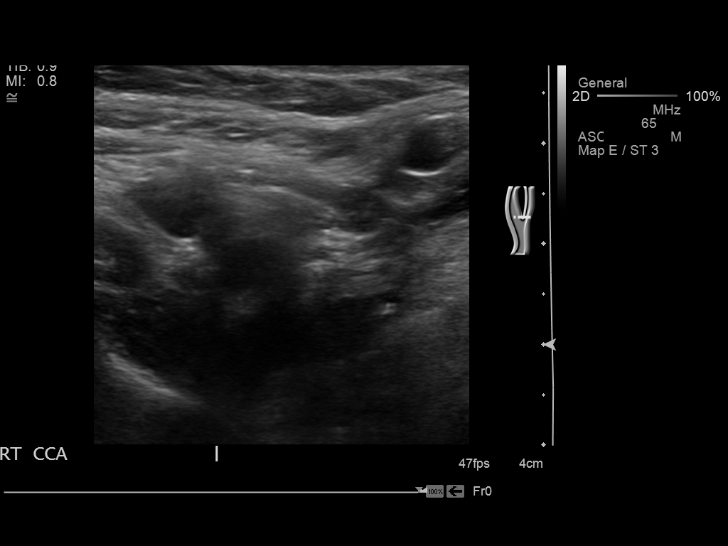
[im 24/68]
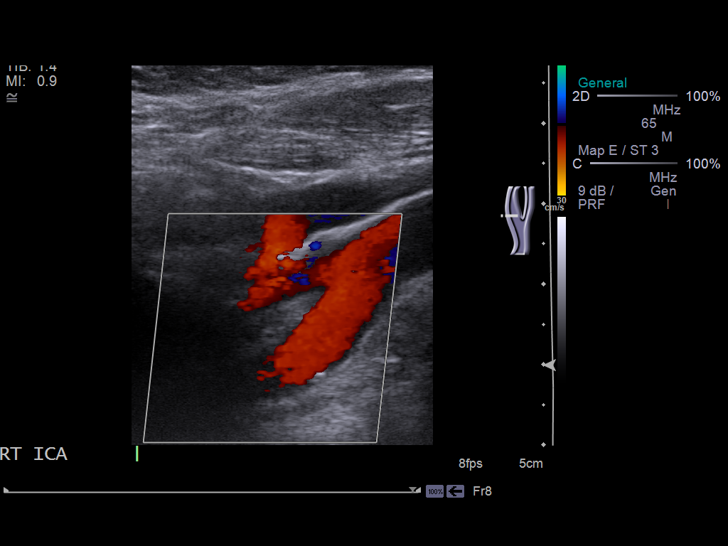
[im 30/68]
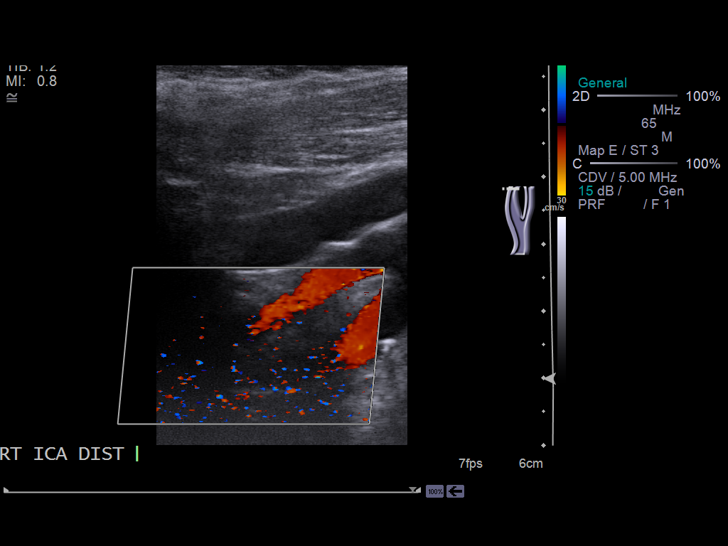
[im 35/68]
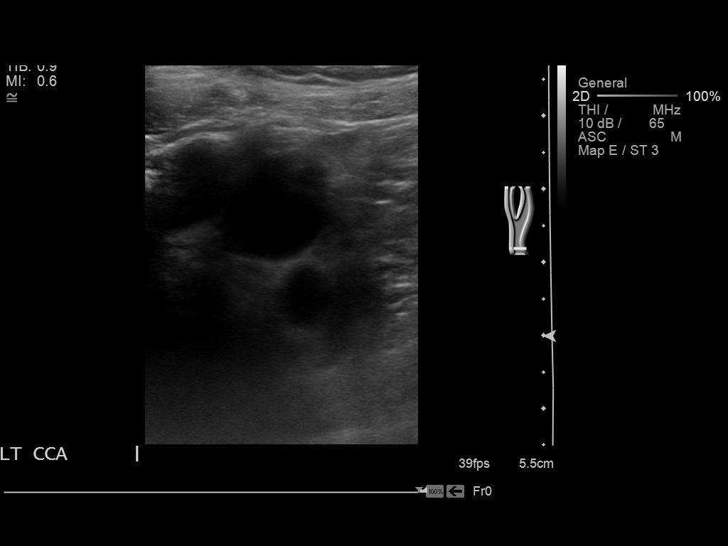
[im 38/68]
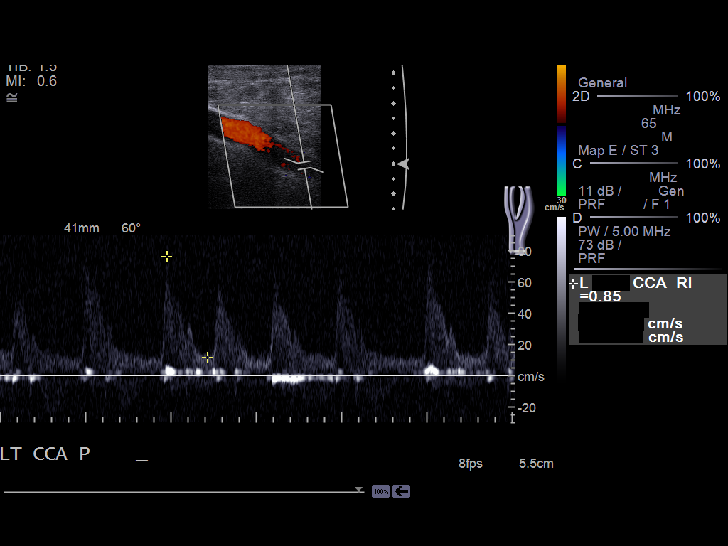
[im 44/68]
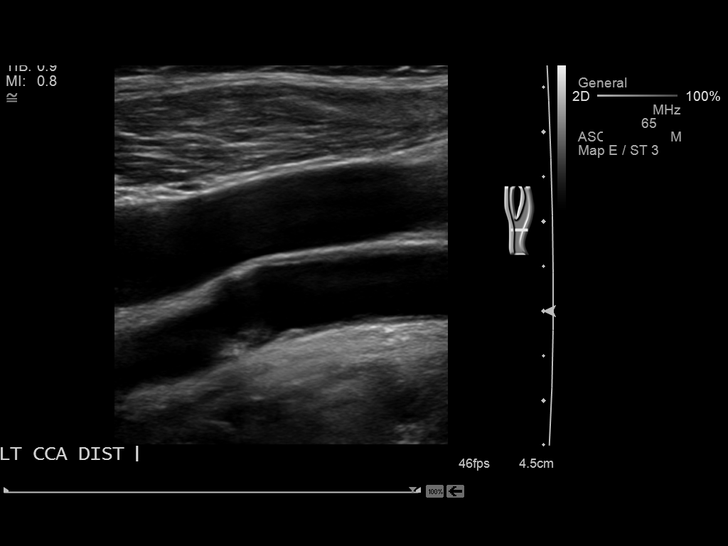
[im 50/68]
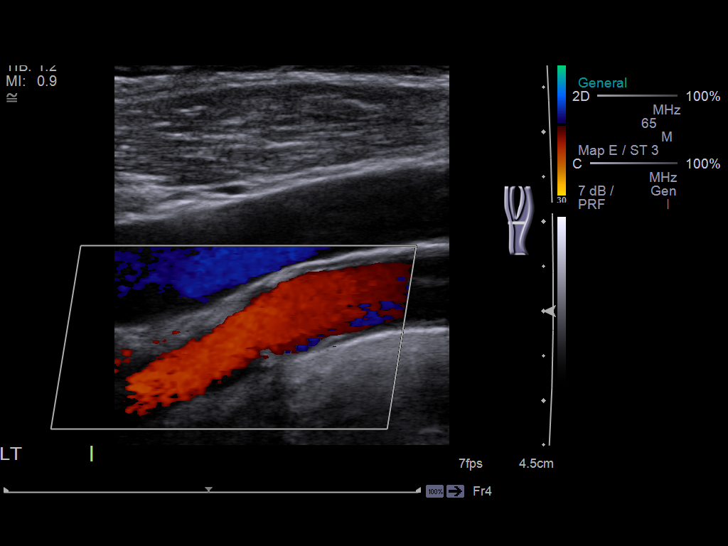
[im 56/68]
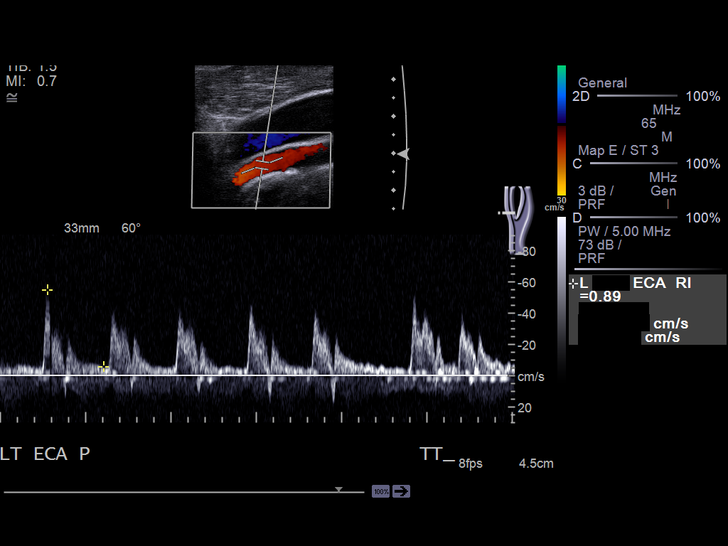
[im 62/68]
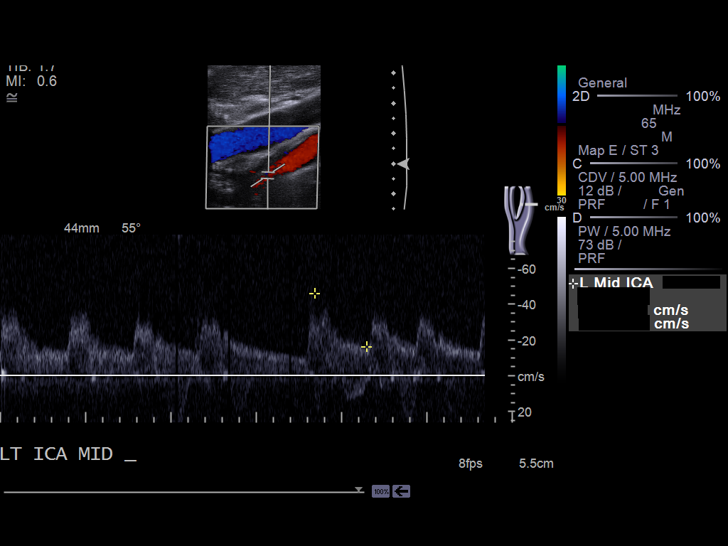
[im 68/68]
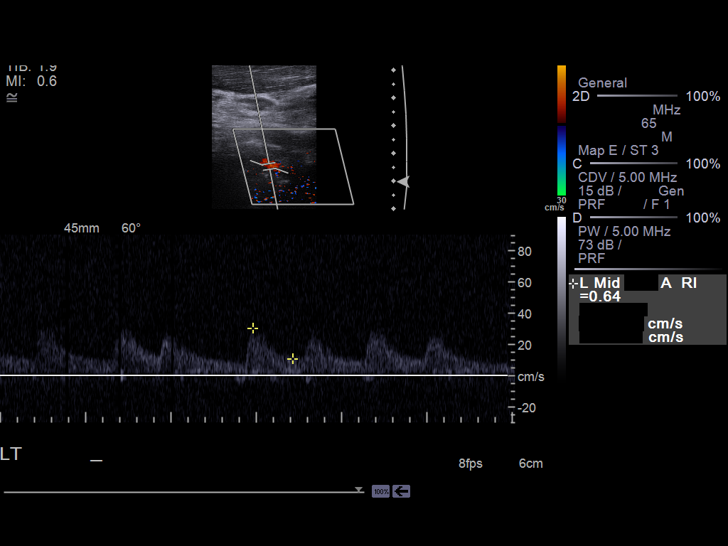

[13 of 24 positions shown; findings below may reference images not displayed]

Criteria:  Quantification of carotid stenosis is based on velocity
parameters that correlate the residual internal carotid diameter
with NASCET-based stenosis levels, using the diameter of the distal
internal carotid lumen as the denominator for stenosis measurement.

The following velocity measurements were obtained:

                 PEAK SYSTOLIC/END DIASTOLIC
RIGHT
ICA:                        53/13cm/sec
CCA:                        62/14cm/sec
SYSTOLIC ICA/CCA RATIO:
DIASTOLIC ICA/CCA RATIO:
ECA:                        66cm/sec

LEFT
ICA:                        46/16cm/sec
CCA:                        60/12cm/sec
SYSTOLIC ICA/CCA RATIO:
DIASTOLIC ICA/CCA RATIO:
ECA:                        55cm/sec
FINDINGS: RIGHT CAROTID ARTERY: Minor atherosclerotic changes.  No
hemodynamically significant right ICA stenosis, velocity elevation,
or turbulent flow.

RIGHT VERTEBRAL ARTERY:  Antegrade

LEFT CAROTID ARTERY: Minor atherosclerotic changes.  No
hemodynamically significant left ICA stenosis, velocity elevation,
or turbulent flow.

LEFT VERTEBRAL ARTERY:  Antegrade
IMPRESSION: Mild bilateral carotid atherosclerosis but no hemodynamically
significant ICA stenosis by ultrasound.  Degree of narrowing less
than 50% bilaterally.

## 2015-03-28 ENCOUNTER — Other Ambulatory Visit: Payer: Self-pay | Admitting: Gastroenterology

## 2015-03-30 ENCOUNTER — Encounter (HOSPITAL_BASED_OUTPATIENT_CLINIC_OR_DEPARTMENT_OTHER): Payer: Medicare Other

## 2015-03-30 VITALS — BP 117/34 | HR 50 | Temp 98.0°F | Resp 16

## 2015-03-30 DIAGNOSIS — D509 Iron deficiency anemia, unspecified: Secondary | ICD-10-CM | POA: Diagnosis present

## 2015-03-30 MED ORDER — SODIUM CHLORIDE 0.9 % IV SOLN
INTRAVENOUS | Status: DC
  Administered 2015-03-30: 13:00:00 via INTRAVENOUS

## 2015-03-30 MED ORDER — SODIUM CHLORIDE 0.9 % IV SOLN
125.0000 mg | Freq: Once | INTRAVENOUS | Status: AC
  Administered 2015-03-30: 125 mg via INTRAVENOUS
  Filled 2015-03-30: qty 10

## 2015-03-30 NOTE — Progress Notes (Signed)
Tolerated iron infusion well. 

## 2015-03-30 NOTE — Patient Instructions (Signed)
[facility] at [facility] Discharge Instructions  RECOMMENDATIONS MADE BY THE CONSULTANT AND ANY TEST RESULTS WILL BE SENT TO YOUR REFERRING PHYSICIAN.  You received IV Iron today.   Sodium Ferric Gluconate Complex injection What is this medicine? SODIUM FERRIC GLUCONATE COMPLEX (SOE dee um FER ik GLOO koe nate KOM pleks) is an iron replacement.  What should I tell my health care provider before I take this medicine? They need to know if you have any of the following conditions: -anemia that is not from iron deficiency -high levels of iron in the body -an unusual or allergic reaction to iron, benzyl alcohol, other medicines, foods, dyes, or preservatives -pregnant or are trying to become pregnant -breast-feeding  How should I use this medicine? This medicine is for infusion into a vein. It is given by a health care professional in a hospital or clinic setting. Talk to your pediatrician regarding the use of this medicine in children. While this drug may be prescribed for children as young as 6 years old for selected conditions, precautions do apply. Overdosage: If you think you have taken too much of this medicine contact a poison control center or emergency room at once. NOTE: This medicine is only for you. Do not share this medicine with others.  What if I miss a dose? It is important not to miss your dose. Call your doctor or health care professional if you are unable to keep an appointment.  What may interact with this medicine? Do not take this medicine with any of the following medications: -deferoxamine -dimercaprol -other iron products This medicine may also interact with the following medications: -chloramphenicol -deferasirox -medicine for blood pressure like enalapril This list may not describe all possible interactions. Give your health care provider a list of all the medicines, herbs, non-prescription drugs, or dietary supplements you use. Also  tell them if you smoke, drink alcohol, or use illegal drugs. Some items may interact with your medicine.  What should I watch for while using this medicine? Your condition will be monitored carefully while you are receiving this medicine. Visit your doctor for check-ups as directed.  What side effects may I notice from receiving this medicine? Side effects that you should report to your doctor or health care professional as soon as possible: -allergic reactions like skin rash, itching or hives, swelling of the face, lips, or tongue -breathing problems -changes in hearing -changes in vision -chills, flushing, or sweating -fast, irregular heartbeat -feeling faint or lightheaded, falls -fever, flu-like symptoms -high or low blood pressure -pain, tingling, numbness in the hands or feet -severe pain in the chest, back, flanks, or groin -swelling of the ankles, feet, hands -trouble passing urine or change in the amount of urine -unusually weak or tired Side effects that usually do not require medical attention (report to your doctor or health care professional if they continue or are bothersome): -cramps -dark colored stools -diarrhea -headache -nausea, vomiting -stomach upset This list may not describe all possible side effects. Call your doctor for medical advice about side effects. You may report side effects to FDA at 1-800-FDA-1088.   Thank you for choosing [facility] at [facility] to provide your oncology and hematology care.  To afford each patient quality time with our provider, please arrive at least 15 minutes before your scheduled appointment time.    You need to re-schedule your appointment should you arrive 10 or more minutes late.  We strive to give   you quality time with our providers, and arriving late affects you and other patients whose appointments are after yours.  Also, if you no show three or more times for appointments you may be dismissed  from the clinic at the providers discretion.     Again, thank you for choosing [facility].  Our hope is that these requests will decrease the amount of time that you wait before being seen by our physicians.       _____________________________________________________________  Should you have questions after your visit to  Cancer Center, please contact our office at [phone] between the hours of 8:30 a.m. and 4:30 p.m.  Voicemails left after 4:30 p.m. will not be returned until the following business day.  For prescription refill requests, have your pharmacy contact our office.     

## 2015-04-10 ENCOUNTER — Ambulatory Visit (HOSPITAL_COMMUNITY)
Admission: RE | Admit: 2015-04-10 | Discharge: 2015-04-10 | Disposition: A | Discharge status: Home / Self Care | Payer: Medicare Other | Source: Ambulatory Visit | Attending: Cardiology | Admitting: Cardiology

## 2015-04-10 DIAGNOSIS — I5032 Chronic diastolic (congestive) heart failure: Secondary | ICD-10-CM

## 2015-04-10 LAB — PULMONARY FUNCTION TEST
DL/VA % pred: 89 %
DL/VA: 4.41 ml/min/mmHg/L
DLCO unc % pred: 53 %
DLCO unc: 13.61 ml/min/mmHg
FEF 25-75 Post: 1.11 L/sec
FEF 25-75 Pre: 2.16 L/sec
FEF2575-%CHANGE-POST: -48 %
FEF2575-%Pred-Post: 51 %
FEF2575-%Pred-Pre: 100 %
FEV1-%Change-Post: -5 %
FEV1-%PRED-POST: 60 %
FEV1-%Pred-Pre: 63 %
FEV1-POST: 1.49 L
FEV1-PRE: 1.58 L
FEV1FVC-%CHANGE-POST: -4 %
FEV1FVC-%Pred-Pre: 120 %
FEV6-%Change-Post: 0 %
FEV6-%PRED-POST: 54 %
FEV6-%Pred-Pre: 54 %
FEV6-PRE: 1.71 L
FEV6-Post: 1.69 L
FEV6FVC-%PRED-PRE: 104 %
FEV6FVC-%Pred-Post: 104 %
FVC-%CHANGE-POST: 0 %
FVC-%Pred-Post: 51 %
FVC-%Pred-Pre: 52 %
FVC-Post: 1.69 L
FVC-Pre: 1.71 L
POST FEV1/FVC RATIO: 88 %
PRE FEV6/FVC RATIO: 100 %
Post FEV6/FVC ratio: 100 %
Pre FEV1/FVC ratio: 93 %
RV % PRED: 87 %
RV: 1.89 L
TLC % pred: 75 %
TLC: 3.94 L

## 2015-04-10 MED ORDER — ALBUTEROL SULFATE (2.5 MG/3ML) 0.083% IN NEBU
2.5000 mg | INHALATION_SOLUTION | Freq: Once | RESPIRATORY_TRACT | Status: AC
  Administered 2015-04-10: 2.5 mg via RESPIRATORY_TRACT

## 2015-04-14 ENCOUNTER — Other Ambulatory Visit (HOSPITAL_COMMUNITY): Payer: Self-pay | Admitting: *Deleted

## 2015-04-14 DIAGNOSIS — R942 Abnormal results of pulmonary function studies: Secondary | ICD-10-CM

## 2015-04-21 ENCOUNTER — Other Ambulatory Visit (INDEPENDENT_AMBULATORY_CARE_PROVIDER_SITE_OTHER): Payer: Medicare Other

## 2015-04-21 ENCOUNTER — Ambulatory Visit (INDEPENDENT_AMBULATORY_CARE_PROVIDER_SITE_OTHER)
Admission: RE | Admit: 2015-04-21 | Discharge: 2015-04-21 | Disposition: A | Discharge status: Home / Self Care | Payer: Medicare Other | Source: Ambulatory Visit | Attending: Internal Medicine | Admitting: Internal Medicine

## 2015-04-21 ENCOUNTER — Ambulatory Visit (INDEPENDENT_AMBULATORY_CARE_PROVIDER_SITE_OTHER): Payer: Medicare Other | Admitting: Internal Medicine

## 2015-04-21 ENCOUNTER — Encounter: Payer: Self-pay | Admitting: Internal Medicine

## 2015-04-21 VITALS — BP 90/60 | HR 55 | Ht 66.0 in | Wt 189.2 lb

## 2015-04-21 DIAGNOSIS — R0600 Dyspnea, unspecified: Secondary | ICD-10-CM | POA: Diagnosis not present

## 2015-04-21 DIAGNOSIS — R918 Other nonspecific abnormal finding of lung field: Secondary | ICD-10-CM | POA: Diagnosis not present

## 2015-04-21 LAB — BRAIN NATRIURETIC PEPTIDE: Pro B Natriuretic peptide (BNP): 470 pg/mL — ABNORMAL HIGH (ref 0.0–100.0)

## 2015-04-21 LAB — SEDIMENTATION RATE: Sed Rate: 20 mm/hr (ref 0–22)

## 2015-04-21 NOTE — Progress Notes (Signed)
Subjective:    Patient ID: [name], female    DOB: 1951/06/20,   MRN: [id number]  HPI  61 yowf quit smoking 1984 no symptoms referred by Dr Shirlee Latch to pulmonary clinic 04/21/2015 with abn pfts p starting amiodarone  01/29/2015 for CAF    04/21/2015 1st [facility] office visit/ Bricen Victory   Chief Complaint  Patient presents with  . Pulmonary Consult    Referred by Dr. Marca Ancona for eval of abnormal PFT's . Pt had PFT done to est baseline before starting amiodarone. She c/o SOB and cough for the past 3 months. She states that she is SOB somtimes just sitting and walking short distances. Her cough is prod with clear sputum, mainly in the am.   able to [employer] pushing cart slow pace s sob  Cough x 2 years    Carries dx of gastroparesis on marinol and gaining wt back to Body mass index = 30.55    Cough is worst first thing in am x years   No obvious  day to day or daytime variabilty or assoc   cp or chest tightness, subjective wheeze overt sinus or hb symptoms. No unusual exp hx or h/o childhood pna/ asthma or knowledge of premature birth.  Sleeping ok without nocturnal  or early am exacerbation  of respiratory  c/o's or need for noct saba. Also denies any obvious fluctuation of symptoms with weather or environmental changes or other aggravating or alleviating factors except as outlined above   Current Medications, Allergies, Complete Past Medical History, Past Surgical History, Family History, and Social History were reviewed in [employer] record.             Review of Systems  Constitutional: Negative for fever, chills and unexpected weight change.  HENT: Positive for trouble swallowing. Negative for congestion, dental problem, ear pain, nosebleeds, postnasal drip, rhinorrhea, sinus pressure, sneezing, sore throat and voice change.   Eyes: Negative for visual disturbance.  Respiratory: Positive for cough and shortness of breath. Negative for choking.    Cardiovascular: Positive for leg swelling. Negative for chest pain.  Gastrointestinal: Positive for abdominal pain. Negative for vomiting and diarrhea.  Genitourinary: Negative for difficulty urinating.  Musculoskeletal: Negative for arthralgias.  Skin: Negative for rash.  Neurological: Negative for tremors, syncope and headaches.  Hematological: Does not bruise/bleed easily.       Objective:   Physical Exam  amb wf appears > stated age  Wt Readings from Last 3 Encounters:  04/21/15 189 lb 3.2 oz (85.821 kg)  03/20/15 174 lb (78.926 kg)  03/16/15 172 lb (78.019 kg)    Vital signs reviewed   HEENT: nl dentition, turbinates, and orophanx. Nl external ear canals without cough reflex   NECK :  without JVD/Nodes/TM/ nl carotid upstrokes bilaterally   LUNGS: no acc muscle use, clear to A and P bilaterally without cough on insp or exp maneuvers   CV:  RRR  no s3 or murmur or increase in P2, trace bilateral lower ext  edema   ABD:  Obese/ soft and nontender with nl excursion in the supine position. No bruits or organomegaly, bowel sounds nl  MS:  warm without deformities, calf tenderness, cyanosis or clubbing  SKIN: warm and dry without lesions    NEURO:  alert, approp, no deficits    Labs ordered/ reviewed   Lab Results  Component Value Date   ESRSEDRATE 20 04/21/2015    Lab Results  Component Value Date  PROBNP 470.0* 04/21/2015    CXR PA and Lateral:   04/21/2015 :    I personally reviewed images and agree with radiology impression as follows:   Cardiomegaly without decompensation. Chronic changes in the left mid and lower lung (min elevation of L HD/ overall better aeration than prev studies)      Assessment & Plan:

## 2015-04-21 NOTE — Patient Instructions (Signed)
Please remember to go to the lab and x-ray department downstairs for your tests - we will call you with the results when they are available.  Add pepcid 20 mg at bedtime   GERD (REFLUX)  is an extremely common cause of respiratory symptoms just like yours , many times with no obvious heartburn at all.    It can be treated with medication, but also with lifestyle changes including elevation of the head of your bed (ideally with 6 inch  bed blocks),  Smoking cessation, avoidance of late meals, excessive alcohol, and avoid fatty foods, chocolate, peppermint, colas, red wine, and acidic juices such as orange juice.  NO MINT OR MENTHOL PRODUCTS SO NO COUGH DROPS  USE SUGARLESS CANDY INSTEAD (Jolley ranchers or Stover's or Life Savers) or even ice chips will also do - the key is to swallow to prevent all throat clearing. NO OIL BASED VITAMINS - use powdered substitutes.    Please schedule a follow up visit in 3 months but call sooner if needed with pfts

## 2015-04-21 NOTE — Progress Notes (Signed)
Quick Note:  LMTCB ______ 

## 2015-04-22 ENCOUNTER — Encounter: Payer: Self-pay | Admitting: Internal Medicine

## 2015-04-22 DIAGNOSIS — G40309 Generalized idiopathic epilepsy and epileptic syndromes, not intractable, without status epilepticus: Secondary | ICD-10-CM | POA: Diagnosis not present

## 2015-04-22 DIAGNOSIS — E538 Deficiency of other specified B group vitamins: Secondary | ICD-10-CM | POA: Diagnosis not present

## 2015-04-22 DIAGNOSIS — M6281 Muscle weakness (generalized): Secondary | ICD-10-CM | POA: Diagnosis not present

## 2015-04-22 DIAGNOSIS — Z79899 Other long term (current) drug therapy: Secondary | ICD-10-CM | POA: Diagnosis not present

## 2015-04-22 DIAGNOSIS — F09 Unspecified mental disorder due to known physiological condition: Secondary | ICD-10-CM | POA: Diagnosis not present

## 2015-04-22 DIAGNOSIS — I4891 Unspecified atrial fibrillation: Secondary | ICD-10-CM | POA: Diagnosis not present

## 2015-04-22 DIAGNOSIS — G259 Extrapyramidal and movement disorder, unspecified: Secondary | ICD-10-CM | POA: Diagnosis not present

## 2015-04-22 NOTE — Assessment & Plan Note (Signed)
Placed on amiodarone 01/29/15  04/10/15  FEV1  1.49 (60%) ratio 88 and dlco 53 corrects to 89%  04/21/2015  Walked RA x 2 laps @ 185 ft each stopped due to  Dizzy and legs gave out, nl pace, no desat   She has a moderate restrictive change on PFTs with proportionate reduction in diffusing capacity and no desaturation with activity. Her sedimentation rate is low relative to her BNP which is just in a more symptoms related to heart failure for sure than amiodarone at this point.  I reviewed with pt and daughter re amio toxicity  Patients typically have been on amiodarone for 6-12 months before this complication manifests.  Of note, serial clinical evaluation for symptoms such as cough dyspnea or fevers is  the preferred method of monitoring for pulmonary toxicity because a decrease in DLCO or lung volumes is a nonspecific for toxicity. Pathologically amiodarone pulmonary toxicity may appear as interstitial pneumonitis, eosinophilic pneumonia, organizing pneumonia, pulmonary fibrosis or less commonly as diffuse alveolar hemorrhage, pulmonary nodules or pleural effusions.  Risk factors for pulmonary toxicity include age greater than 60, daily dose greater than equal to 400 mg, a high cumulative dose, or pre-existing lung disease - of these the only one that applies is her age   rec f/u with pfts in 3 months, sooner if new symptoms.   Total time devoted to counseling  = 70m review case with pt/ daughter discussion of options// giving and going over instructions (see avs)

## 2015-04-23 NOTE — Progress Notes (Signed)
Quick Note:  Spoke with pt and notified of results per Dr. Wert. Pt verbalized understanding and denied any questions.  ______ 

## 2015-04-27 ENCOUNTER — Telehealth: Payer: Self-pay | Admitting: Adult Health

## 2015-04-27 NOTE — Telephone Encounter (Signed)
Patient's daughter is asking about status of Eliquis assistance form. Also asking for samples. Patient calling the office for samples of medication:   1.  What medication and dosage are you requesting samples for? Eliquis 5 mg  2.  Are you currently out of this medication? No  3. Are you requesting samples to get you through until you receive your prescription? Until we here from Patient Assistance progam

## 2015-04-28 NOTE — Telephone Encounter (Signed)
Called pt, spoke to her daughter. I explained that the assistance application still has not been processed, but we will give her enough samples so that she does not run out. Will check back on the assistance application in a few days to see if there is a status change.

## 2015-05-12 ENCOUNTER — Telehealth: Payer: Self-pay | Admitting: *Deleted

## 2015-05-12 NOTE — Telephone Encounter (Signed)
Patient notified that Eliquis patient assistance program has been approved.

## 2015-06-02 ENCOUNTER — Other Ambulatory Visit (HOSPITAL_COMMUNITY): Payer: Self-pay | Admitting: Oncology

## 2015-06-18 ENCOUNTER — Encounter (HOSPITAL_BASED_OUTPATIENT_CLINIC_OR_DEPARTMENT_OTHER): Payer: Medicare Other

## 2015-06-18 ENCOUNTER — Encounter (HOSPITAL_COMMUNITY): Payer: Self-pay | Admitting: Oncology

## 2015-06-18 ENCOUNTER — Encounter (HOSPITAL_COMMUNITY): Payer: Medicare Other | Attending: Oncology | Admitting: Oncology

## 2015-06-18 ENCOUNTER — Other Ambulatory Visit (HOSPITAL_COMMUNITY): Payer: Self-pay | Admitting: Hematology & Oncology

## 2015-06-18 VITALS — BP 126/53 | HR 56 | Temp 98.4°F | Resp 16 | Wt 194.8 lb

## 2015-06-18 DIAGNOSIS — D509 Iron deficiency anemia, unspecified: Secondary | ICD-10-CM | POA: Diagnosis not present

## 2015-06-18 DIAGNOSIS — Z1231 Encounter for screening mammogram for malignant neoplasm of breast: Secondary | ICD-10-CM

## 2015-06-18 LAB — CBC WITH DIFFERENTIAL/PLATELET
Basophils Absolute: 0 10*3/uL (ref 0.0–0.1)
Basophils Relative: 0 %
Eosinophils Absolute: 0.2 10*3/uL (ref 0.0–0.7)
Eosinophils Relative: 2 %
HCT: 41.2 % (ref 36.0–46.0)
HEMOGLOBIN: 13.6 g/dL (ref 12.0–15.0)
LYMPHS ABS: 3.4 10*3/uL (ref 0.7–4.0)
LYMPHS PCT: 44 %
MCH: 31.9 pg (ref 26.0–34.0)
MCHC: 33 g/dL (ref 30.0–36.0)
MCV: 96.7 fL (ref 78.0–100.0)
MONOS PCT: 13 %
Monocytes Absolute: 1 10*3/uL (ref 0.1–1.0)
NEUTROS PCT: 41 %
Neutro Abs: 3.2 10*3/uL (ref 1.7–7.7)
Platelets: 227 10*3/uL (ref 150–400)
RBC: 4.26 MIL/uL (ref 3.87–5.11)
RDW: 13.9 % (ref 11.5–15.5)
WBC: 7.7 10*3/uL (ref 4.0–10.5)

## 2015-06-18 LAB — FERRITIN: Ferritin: 39 ng/mL (ref 11–307)

## 2015-06-18 NOTE — Patient Instructions (Signed)
[facility] at [facility] Discharge Instructions  RECOMMENDATIONS MADE BY THE CONSULTANT AND ANY TEST RESULTS WILL BE SENT TO YOUR REFERRING PHYSICIAN.  Exam and discussion by Dellis Anes, PA-C Will let you know if you need iron Mammogram in Mid-January Report increased shortness of breath or increased fatigue.  Labs every 3 months and office visit in 6 months.   Thank you for choosing  [facility] at [facility] to provide your oncology and hematology care.  To afford each patient quality time with our provider, please arrive at least 15 minutes before your scheduled appointment time.    You need to re-schedule your appointment should you arrive 10 or more minutes late.  We strive to give you quality time with our providers, and arriving late affects you and other patients whose appointments are after yours.  Also, if you no show three or more times for appointments you may be dismissed from the clinic at the providers discretion.     Again, thank you for choosing [facility].  Our hope is that these requests will decrease the amount of time that you wait before being seen by our physicians.       _____________________________________________________________  Should you have questions after your visit to [facility], please contact our office at [phone] between the hours of 8:30 a.m. and 4:30 p.m.  Voicemails left after 4:30 p.m. will not be returned until the following business day.  For prescription refill requests, have your pharmacy contact our office.

## 2015-06-18 NOTE — Assessment & Plan Note (Signed)
Iron deficiency anemia having undergone an extensive GI work-up that was negative including colonoscopy (11/08/2013), EGD (11/21/2014), and camera capsule study.   Labs today: CBC diff, ferritin  She is overdue for mammogram.  We will get that scheduled for her.  Labs in 3 months: CBC, ferritin  Labs in 6 months: CBC diff, iron/TIBC, ferritin  We will replace her iron as indicated with the appropriate dose according to her iron deficit.  Return in 6 months for follow-up

## 2015-06-18 NOTE — Progress Notes (Signed)
Tara Obey, MD [address] [place] [address]  Iron deficiency anemia - Plan: CBC, Ferritin, CBC with Differential, Ferritin, Iron and TIBC  CURRENT THERAPY: PO Iron replacement (Poly-Iron) beginning June 2016 and IV iron replacement  Oncology Flowsheet 03/23/2015 03/30/2015  ferric gluconate (NULECIT) IV 125 mg 125 mg    INTERVAL HISTORY: [name] 64 y.o. female returns for followup of iron deficiency anemia after an extensive GI work-up that is negative including colonoscopy (11/08/2013), EGD (11/21/2014), and camera capsule study.   I personally reviewed and went over laboratory results with the patient.  The results are noted within this dictation.  I personally reviewed and went over radiographic studies with the patient.  The results are noted within this dictation.  She is overdue for mammography.  This was ordered by Dr. Galen Manila in September 2016 but I do not see where it was scheduled.  She has not had one within the last 12 months.  I will make sure this is scheduled today.  She is compliant with her PO iron medications, but she notes intermittent constipation.  She is not sure if it is from PO iron or her IBS as she does fluctuate from diarrhea to constipation from that diagnosis.  For now, she will continue with PO iron.  She knows to stop the medication and call us with any issues.  I personally reviewed and went over laboratory results with the patient.  The results are noted within this dictation. Labs are updated today.   Past Medical History  Diagnosis Date  . Chest pain     Negative cardiac catheterization in 2002; negative stress nuclear study in 2008  . Chronic diastolic CHF (congestive heart failure) (HCC)     a. EF predominantly normal during prior echoes but was 40% during 10/2014 echo. b. Most recent 01/2015 EF normal, 55-60%.  . Paroxysmal atrial fibrillation (HCC)   . Chronic anticoagulation   . Diabetes mellitus, type 2 (HCC)       Insulin therapy; exacerbated by prednisone  . Syncope     a. Admitted 05/2009; magnetic resonance imagin/ MRA - negative; etiology thought to be orthostasis secondary to drugs and dehydration. b. Syncope 02/2015 also felt 2/2 dehydration.  Marland Kitchen GERD (gastroesophageal reflux disease)   . IBS (irritable bowel syndrome)   . Hiatal hernia   . Gastroparesis     99% retention 05/2008 on GES  . Hyperlipidemia   . Hypertension   . Hypothyroid   . Chronic LBP     Surgical intervention in 1996  . Obesity   . OSA on CPAP   . Anemia     H/H of 10/30 with a normal MCV in 12/09  . Barrett's esophagus     Diagnosed 1995. Last EGD 2016-NO BARRETT'S.   . Seizures (HCC)   . Pulmonary hypertension (HCC) 01/2015    a. Predominantly pulmonary venous hypertension but may be component of PAH.    has Hypothyroidism; DIABETES MELLITUS, TYPE II, ON INSULIN; Hyperlipidemia; OBESITY; Iron deficiency anemia; Essential hypertension; Atrial fibrillation (HCC); Congestive heart failure (HCC); HYPOTENSION, ORTHOSTATIC; LUNG NODULE; Barrett's esophagus; Gastroparesis; Irritable bowel syndrome; LOW BACK PAIN, CHRONIC; SLEEP APNEA; CHEST PAIN-PRECORDIAL; CARPAL TUNNEL SYNDROME, HX OF; Chronic anticoagulation; Tremor; Dyspnea; Diarrhea; Abnormal weight loss; Lower abdominal pain; Anorexia nervosa; Encounter for therapeutic drug monitoring; Chest pain at rest; Obesity; Chest pain; Acute CHF (HCC); Atrial fibrillation with RVR (HCC); Syncope; Dementia; Chronic diarrhea; DM type 2 (diabetes mellitus, type  2) (HCC); Protein-calorie malnutrition, severe (HCC); Anemia; Epistaxis, recurrent; Ascites; Diffuse abdominal pain; Low hemoglobin; Acute on chronic combined systolic and diastolic CHF (congestive heart failure) (HCC); Syncope and collapse; Dehydration; UTI (urinary tract infection); Chronic diastolic CHF (congestive heart failure) (HCC); and IDA (iron deficiency anemia) on her problem list.     is allergic to ibuprofen;  codeine; celexa; and reglan.  Current Outpatient Prescriptions on File Prior to Visit  Medication Sig Dispense Refill  . acetaminophen (TYLENOL) 500 MG tablet Take 500 mg by mouth every 6 (six) hours as needed for moderate pain or headache.    Marland Kitchen amiodarone (PACERONE) 200 MG tablet Take 1 tablet (200 mg total) by mouth daily. 30 tablet 6  . apixaban (ELIQUIS) 5 MG TABS tablet Take 5 mg by mouth 2 (two) times daily.    Marland Kitchen dicyclomine (BENTYL) 10 MG capsule Take 10 mg by mouth 2 (two) times daily as needed for spasms.    Marland Kitchen dronabinol (MARINOL) 2.5 MG capsule Take 1 capsule (2.5 mg total) by mouth 2 (two) times daily before a meal. 60 capsule 0  . DULoxetine (CYMBALTA) 30 MG capsule Take 30 mg by mouth 2 (two) times daily.    . furosemide (LASIX) 40 MG tablet Take 20-40 mg by mouth 2 (two) times daily. Reported on 06/18/2015    . glipiZIDE (GLUCOTROL) 10 MG tablet Take 10 mg by mouth daily before breakfast.     . Insulin Glargine (LANTUS SOLOSTAR) 100 UNIT/ML Solostar Pen Inject 20-50 Units into the skin at bedtime as needed. Patient takes 20 units every night at bedtime if over 178.  Takes 50 units if her blood sugar is above 300.    . levothyroxine (SYNTHROID, LEVOTHROID) 137 MCG tablet Take 137 mcg by mouth daily.    Marland Kitchen loperamide (IMODIUM) 2 MG capsule TAKE (1) CAPSULE THREE TIMES DAILY AS NEEDED FOR DIARRHEA OR LOOSE STOOLS. 30 capsule 3  . LORazepam (ATIVAN) 1 MG tablet Take 1 mg by mouth 3 (three) times daily as needed for anxiety or sleep. For anxiety    . magnesium oxide (MAG-OX) 400 MG tablet Take 400 mg by mouth daily.     . metaxalone (SKELAXIN) 800 MG tablet Take 800 mg by mouth 3 (three) times daily.     . metFORMIN (GLUCOPHAGE) 500 MG tablet Take 1,000 mg by mouth 2 (two) times daily with a meal.     . Multiple Vitamin (MULTIVITAMIN WITH MINERALS) TABS tablet Take 1 tablet by mouth daily.    . nitroGLYCERIN (NITROSTAT) 0.4 MG SL tablet Place 0.4 mg under the tongue every 5 (five) minutes  as needed for chest pain. For chest pain     . omeprazole (PRILOSEC) 20 MG capsule Take 20 mg by mouth 2 (two) times daily.      . ondansetron (ZOFRAN) 4 MG tablet Take 4 mg by mouth every 6 (six) hours as needed. For nausea    . POLY-IRON 150 150 MG capsule TAKE 1 CAPSULE BY MOUTH 2 TIMES DAILY. 60 capsule 5  . potassium chloride (K-DUR) 10 MEQ tablet Take 2 tablets by mouth 2 (two) times daily.    . vitamin B-12 (CYANOCOBALAMIN) 500 MCG tablet Take 500 mcg by mouth daily.     No current facility-administered medications on file prior to visit.    Past Surgical History  Procedure Laterality Date  . Cardiovascular stress test  2008    Stress nuclear study  . Back surgery  1996  . Laparoscopic cholecystectomy  1990s  .  Total abdominal hysterectomy  1999  . Laminectomy  1995    L4-L5  . Carpal tunnel release  1994  . Esophagogastroduodenoscopy  2008    Barrett's without dysplasia. esphagus dilated. antral erosions, h.pylori serologies negative.  . Colonoscopy  11/2011    Dr. Darrick Penna: Internal hemorrhoids, mild diverticulosis. Random colon biopsies negative.  . Givens capsule study  12/07/2011    Proximal small bowel, rare AVM. Distal small bowel, multiple ulcers noted  . Esophagogastroduodenoscopy  11/2011    Dr. Darrick Penna: Barrett's esophagus, mild gastritis, diverticulum in the second portion of the duodenum repeat EGD 3 years. Small bowel biopsies negative. Gastric biopsy show reactive gastropathy but no H. pylori. Esophageal biopsies consistent with GERD. Next EGD 11/2014  . Givens capsule study N/A 09/27/2013    Distal small bowel ulcers extending to TI.  Marland Kitchen Givens capsule study N/A 10/10/2013    Procedure: GIVENS CAPSULE STUDY;  Surgeon: West Bali, MD;  Location: [facility] SUITE;  Service: Endoscopy;  Laterality: N/A;  7:30  . Colonoscopy N/A 11/08/2013    SLF: Normal mucosa in the terminal ileum/The colon IS redundant/  Moderate diverticulosis throughout the entire colon. ileum bx benign.  colon bx benign  . Esophageal biopsy N/A 11/08/2013    Procedure: BIOPSY  / Tissue sampling / ulcers present in small intestine;  Surgeon: West Bali, MD;  Location: [facility];  Service: Endoscopy;  Laterality: N/A;  . Cardiac catheterization  2002  . Right heart catheterization N/A 01/10/2014    Procedure: RIGHT HEART CATH;  Surgeon: Lesleigh Noe, MD;  Location: [facility] CATH LAB;  Service: Cardiovascular;  Laterality: N/A;  . Left heart catheterization with coronary angiogram  01/10/2014    Procedure: LEFT HEART CATHETERIZATION WITH CORONARY ANGIOGRAM;  Surgeon: Lesleigh Noe, MD;  Location: [facility] CATH LAB;  Service: Cardiovascular;;  . Esophagogastroduodenoscopy N/A 11/21/2014    MZT:AEWY non-erosive gastritis/irregular z-line  . Cardiac catheterization N/A 01/26/2015    Procedure: Right Heart Cath;  Surgeon: Laurey Morale, MD;  Location: [facility] INVASIVE CV LAB;  Service: Cardiovascular;  Laterality: N/A;  . Cardioversion N/A 03/06/2015    Procedure: CARDIOVERSION;  Surgeon: Laurey Morale, MD;  Location: [facility] ENDOSCOPY;  Service: Cardiovascular;  Laterality: N/A;    Denies any headaches, dizziness, double vision, fevers, chills, night sweats, nausea, vomiting, diarrhea, constipation, chest pain, heart palpitations, shortness of breath, blood in stool, black tarry stool, urinary pain, urinary burning, urinary frequency, hematuria.   PHYSICAL EXAMINATION  ECOG PERFORMANCE STATUS: 2 - Symptomatic, <50% confined to bed  Filed Vitals:   06/18/15 1344  BP: 126/53  Pulse: 56  Temp: 98.4 F (36.9 C)  Resp: 16    GENERAL:alert, no distress, well nourished, well developed, comfortable, cooperative, smiling and accompanied by her daughter. SKIN: skin color, texture, turgor are normal, no rashes or significant lesions HEAD: Normocephalic, No masses, lesions, tenderness or abnormalities EYES: normal, EOMI, Conjunctiva are pink and non-injected EARS: External ears normal OROPHARYNX:lips,  buccal mucosa, and tongue normal and mucous membranes are moist  NECK: supple, no adenopathy, thyroid normal size, non-tender, without nodularity, trachea midline LYMPH:  no palpable lymphadenopathy BREAST:not examined LUNGS: clear to auscultation and percussion HEART: regular rate & rhythm, no murmurs, no gallops, S1 normal and S2 normal ABDOMEN:abdomen soft, non-tender, obese and normal bowel sounds BACK: Back symmetric, no curvature. EXTREMITIES:less then 2 second capillary refill, no joint deformities, effusion, or inflammation, no skin discoloration, no cyanosis  NEURO: alert & oriented x 3  with fluent speech, no focal motor/sensory deficits, gait normal   LABORATORY DATA: CBC    Component Value Date/Time   WBC 7.7 06/18/2015 1313   RBC 4.26 06/18/2015 1313   RBC 3.77* 06/30/2013 0318   HGB 13.6 06/18/2015 1313   HCT 41.2 06/18/2015 1313   PLT 227 06/18/2015 1313   MCV 96.7 06/18/2015 1313   MCH 31.9 06/18/2015 1313   MCHC 33.0 06/18/2015 1313   RDW 13.9 06/18/2015 1313   LYMPHSABS 3.4 06/18/2015 1313   MONOABS 1.0 06/18/2015 1313   EOSABS 0.2 06/18/2015 1313   BASOSABS 0.0 06/18/2015 1313      Chemistry      Component Value Date/Time   NA 137 03/20/2015 1230   K 5.0 03/20/2015 1230   CL 105 03/20/2015 1230   CO2 23 03/20/2015 1230   BUN 18 03/20/2015 1230   CREATININE 0.85 03/20/2015 1230   CREATININE 0.70 02/13/2015 1414      Component Value Date/Time   CALCIUM 9.2 03/20/2015 1230   CALCIUM 8.4 06/30/2013 0318   ALKPHOS 81 02/13/2015 1414   AST 12 02/13/2015 1414   ALT 7 02/13/2015 1414   BILITOT 0.5 02/13/2015 1414     Lab Results  Component Value Date   FERRITIN 30 03/16/2015     PENDING LABS:   RADIOGRAPHIC STUDIES:  No results found.   PATHOLOGY:    ASSESSMENT AND PLAN:  Iron deficiency anemia Iron deficiency anemia having undergone an extensive GI work-up that was negative including colonoscopy (11/08/2013), EGD (11/21/2014), and camera  capsule study.   Labs today: CBC diff, ferritin  She is overdue for mammogram.  We will get that scheduled for her.  Labs in 3 months: CBC, ferritin  Labs in 6 months: CBC diff, iron/TIBC, ferritin  We will replace her iron as indicated with the appropriate dose according to her iron deficit.  Return in 6 months for follow-up   THERAPY PLAN:  Will continue to support her counts with IV iron when indicated.  All questions were answered. The patient knows to call the clinic with any problems, questions or concerns. We can certainly see the patient much sooner if necessary.  Patient and plan discussed with Dr. Loma Messing and she is in agreement with the aforementioned.   This note is electronically signed by: Dellis Anes, PA-C 06/18/2015 2:01 PM

## 2015-06-19 NOTE — Progress Notes (Signed)
Labs drawn

## 2015-06-22 ENCOUNTER — Other Ambulatory Visit (HOSPITAL_COMMUNITY): Payer: Self-pay | Admitting: Hematology & Oncology

## 2015-06-22 ENCOUNTER — Telehealth: Payer: Self-pay

## 2015-06-22 DIAGNOSIS — G40309 Generalized idiopathic epilepsy and epileptic syndromes, not intractable, without status epilepticus: Secondary | ICD-10-CM | POA: Diagnosis not present

## 2015-06-22 DIAGNOSIS — M6281 Muscle weakness (generalized): Secondary | ICD-10-CM | POA: Diagnosis not present

## 2015-06-22 DIAGNOSIS — F09 Unspecified mental disorder due to known physiological condition: Secondary | ICD-10-CM | POA: Diagnosis not present

## 2015-06-22 DIAGNOSIS — E538 Deficiency of other specified B group vitamins: Secondary | ICD-10-CM | POA: Diagnosis not present

## 2015-06-22 DIAGNOSIS — R269 Unspecified abnormalities of gait and mobility: Secondary | ICD-10-CM | POA: Diagnosis not present

## 2015-06-22 DIAGNOSIS — G259 Extrapyramidal and movement disorder, unspecified: Secondary | ICD-10-CM | POA: Diagnosis not present

## 2015-06-22 DIAGNOSIS — M797 Fibromyalgia: Secondary | ICD-10-CM | POA: Diagnosis not present

## 2015-06-22 DIAGNOSIS — Z79899 Other long term (current) drug therapy: Secondary | ICD-10-CM | POA: Diagnosis not present

## 2015-06-22 NOTE — Telephone Encounter (Signed)
[name] came by and said the pharmacy was supposed to have faxed request for Marinol today. We have not received the request, but told her I will send the request to the refill box.

## 2015-06-23 ENCOUNTER — Other Ambulatory Visit: Payer: Self-pay

## 2015-06-24 ENCOUNTER — Other Ambulatory Visit: Payer: Self-pay

## 2015-06-24 MED ORDER — DRONABINOL 2.5 MG PO CAPS: 2.5000 mg | ORAL_CAPSULE | Freq: Two times a day (BID) | ORAL | Status: DC

## 2015-06-24 NOTE — Telephone Encounter (Signed)
Please notify the patient that the Rx is ready for pickup

## 2015-06-24 NOTE — Telephone Encounter (Signed)
Daughter aware. Faxed rx to pharmacy

## 2015-07-06 DIAGNOSIS — K210 Gastro-esophageal reflux disease with esophagitis: Secondary | ICD-10-CM | POA: Diagnosis not present

## 2015-07-06 DIAGNOSIS — I5032 Chronic diastolic (congestive) heart failure: Secondary | ICD-10-CM | POA: Diagnosis not present

## 2015-07-06 DIAGNOSIS — E1142 Type 2 diabetes mellitus with diabetic polyneuropathy: Secondary | ICD-10-CM | POA: Diagnosis not present

## 2015-07-06 DIAGNOSIS — L03211 Cellulitis of face: Secondary | ICD-10-CM | POA: Diagnosis not present

## 2015-07-07 ENCOUNTER — Telehealth (HOSPITAL_COMMUNITY): Payer: Self-pay

## 2015-07-07 NOTE — Telephone Encounter (Signed)
Pt's dtr called CHF clinic triage line c/o patient wt going up slowly past weeks/months.  Since last visit in 03/2015 (4 months ago), wt has gone up 30 lbs.  Patient unsure if this is true weight or fluid.  Does not notice any pitting edema, not SOB, and otherwise feels fine.  Added on to [facility] clinic schedule tomorrow to further assess.  Ave Filter

## 2015-07-08 ENCOUNTER — Ambulatory Visit (HOSPITAL_COMMUNITY)
Admission: RE | Admit: 2015-07-08 | Discharge: 2015-07-08 | Disposition: A | Discharge status: Home / Self Care | Payer: Medicare Other | Source: Ambulatory Visit | Attending: Internal Medicine | Admitting: Internal Medicine

## 2015-07-08 VITALS — BP 154/78 | HR 60 | Wt 208.3 lb

## 2015-07-08 DIAGNOSIS — K2270 Barrett's esophagus without dysplasia: Secondary | ICD-10-CM | POA: Diagnosis not present

## 2015-07-08 DIAGNOSIS — Z7984 Long term (current) use of oral hypoglycemic drugs: Secondary | ICD-10-CM | POA: Insufficient documentation

## 2015-07-08 DIAGNOSIS — Z79899 Other long term (current) drug therapy: Secondary | ICD-10-CM | POA: Diagnosis not present

## 2015-07-08 DIAGNOSIS — I5032 Chronic diastolic (congestive) heart failure: Secondary | ICD-10-CM | POA: Insufficient documentation

## 2015-07-08 DIAGNOSIS — Z87891 Personal history of nicotine dependence: Secondary | ICD-10-CM | POA: Insufficient documentation

## 2015-07-08 DIAGNOSIS — I482 Chronic atrial fibrillation: Secondary | ICD-10-CM | POA: Diagnosis not present

## 2015-07-08 DIAGNOSIS — E039 Hypothyroidism, unspecified: Secondary | ICD-10-CM | POA: Insufficient documentation

## 2015-07-08 DIAGNOSIS — I480 Paroxysmal atrial fibrillation: Secondary | ICD-10-CM | POA: Diagnosis not present

## 2015-07-08 DIAGNOSIS — E119 Type 2 diabetes mellitus without complications: Secondary | ICD-10-CM | POA: Insufficient documentation

## 2015-07-08 DIAGNOSIS — Z7901 Long term (current) use of anticoagulants: Secondary | ICD-10-CM | POA: Insufficient documentation

## 2015-07-08 DIAGNOSIS — G4733 Obstructive sleep apnea (adult) (pediatric): Secondary | ICD-10-CM | POA: Diagnosis not present

## 2015-07-08 DIAGNOSIS — K219 Gastro-esophageal reflux disease without esophagitis: Secondary | ICD-10-CM | POA: Insufficient documentation

## 2015-07-08 DIAGNOSIS — K589 Irritable bowel syndrome without diarrhea: Secondary | ICD-10-CM | POA: Insufficient documentation

## 2015-07-08 DIAGNOSIS — Z794 Long term (current) use of insulin: Secondary | ICD-10-CM | POA: Insufficient documentation

## 2015-07-08 DIAGNOSIS — E669 Obesity, unspecified: Secondary | ICD-10-CM

## 2015-07-08 NOTE — Progress Notes (Signed)
Advanced Heart Failure Medication Review by a Pharmacist  Does the patient  feel that his/her medications are working for him/her?  yes  Has the patient been experiencing any side effects to the medications prescribed?  no  Does the patient measure his/her own blood pressure or blood glucose at home?  no   Does the patient have any problems obtaining medications due to transportation or finances?   no  Understanding of regimen: good Understanding of indications: good Potential of compliance: good Patient understands to avoid NSAIDs. Patient understands to avoid decongestants.  Issues to address at subsequent visits: None   Pharmacist comments:  [name] is a pleasant 65 yo F presenting with her niece and a current medication list. She reports good compliance with her medications and states that she has felt much better since her cardioversion. She did not have any specific medication-related questions or concerns at this time.   Tyler Deis. Bonnye Fava, PharmD, BCPS, CPP Clinical Pharmacist Pager: [phone] Phone: [phone] 07/08/2015 2:19 PM      Time with patient: 10 minutes Preparation and documentation time: 2 minutes Total time: 12 minutes

## 2015-07-08 NOTE — Patient Instructions (Signed)
Your physician recommends that you schedule a follow-up appointment in: 3 months with Dr. McLean  Do the following things EVERYDAY: 1) Weigh yourself in the morning before breakfast. Write it down and keep it in a log. 2) Take your medicines as prescribed 3) Eat low salt foods-Limit salt (sodium) to 2000 mg per day.  4) Stay as active as you can everyday 5) Limit all fluids for the day to less than 2 liters 6)   

## 2015-07-08 NOTE — Progress Notes (Signed)
Patient ID: [name], female   DOB: [birth year], 65 y.o.   MRN: [id number]  Advanced HF Clinic Note  Patient ID: [name], female   DOB: [birth year], 65 y.o.   MRN: [id number] PCP: Dr. [name] Primary cardiologist: Dr Wyline Mood HF: Dr Shirlee Latch  65 yo with history of chronic atrial fibrillation and chronic diastolic CHF .  RHC in 7/16 showing markedly elevated right and left heart filling pressures and primarily pulmonary venous hypertension.   She presents today for HF follow up due to 30 pound weight gain over the last 4 months. Weight at home has been trending up from 179-203 pounds. Overall feeling ok. Complaining of R knee pain. Denies SOB/Orthopnea/PND. Does have sleep apnea but intolerant CPAP due claustrophobia.  Activity limited due to right leg pain. Says she fell a couple months ago. Appetite ok. Taking all medications. Disabled.   Labs (8/16): K 4.7, creatinine 0.7, LFTs normal, TSH normal, HCT 32.2 Labs (9/16): HCT 38.2 Labs (06/18/2015): Hgb 13.6   PMH: 1. Atrial fibrillation: Paroxysmal.  DCCV to NSR 9/16.  2. Chronic diastolic CHF: EF was previously down to 40%, but most recent echo in 7/16 showed EF 55-60%, moderate LVH, mild MR, severe biatrial enlargement, PA systolic pressure 46 mmHg.  RHC (7/16) with mean RA 23, PA 70/36 mean 52, mean PCWP 31, CI 2.32, PVR 4.6 WU.  3. GERD with Barretts esophagus 4. Hypothyroidism 5. OSA intolerant CPAP due to claustrophobia 6. Type II diabetes 7. IBS  SH: Married, retired (used to work at [employer]).  Daughter brings to appts.  Lives in [place].  Prior smoker, quit 1984.  FH: Mother with MI  ROS: All systems reviewed and negative except as per HPI.   Current Outpatient Prescriptions  Medication Sig Dispense Refill  . acetaminophen (TYLENOL) 500 MG tablet Take 500 mg by mouth every 6 (six) hours as needed for moderate pain or headache.    Marland Kitchen amiodarone (PACERONE) 200 MG tablet Take 1 tablet (200 mg total) by mouth daily. 30  tablet 6  . apixaban (ELIQUIS) 5 MG TABS tablet Take 5 mg by mouth 2 (two) times daily.    Marland Kitchen dicyclomine (BENTYL) 10 MG capsule Take 10 mg by mouth 2 (two) times daily as needed for spasms.    . DULoxetine (CYMBALTA) 30 MG capsule Take 30 mg by mouth 2 (two) times daily.    Marland Kitchen glipiZIDE (GLUCOTROL) 10 MG tablet Take 10 mg by mouth daily before breakfast.     . Insulin Glargine (LANTUS SOLOSTAR) 100 UNIT/ML Solostar Pen Inject 20-50 Units into the skin at bedtime as needed. Patient takes 20 units every night at bedtime if over 178.  Takes 50 units if her blood sugar is above 300.    . levETIRAcetam (KEPPRA) 500 MG tablet Take 500 mg by mouth 2 (two) times daily.    Marland Kitchen levothyroxine (SYNTHROID, LEVOTHROID) 137 MCG tablet Take 137 mcg by mouth daily.    Marland Kitchen loperamide (IMODIUM) 2 MG capsule TAKE (1) CAPSULE THREE TIMES DAILY AS NEEDED FOR DIARRHEA OR LOOSE STOOLS. 30 capsule 3  . LORazepam (ATIVAN) 1 MG tablet Take 1 mg by mouth 3 (three) times daily as needed for anxiety or sleep. For anxiety    . magnesium oxide (MAG-OX) 400 MG tablet Take 400 mg by mouth daily.     . metaxalone (SKELAXIN) 800 MG tablet Take 800 mg by mouth 3 (three) times daily.     . metFORMIN (GLUCOPHAGE) 500 MG tablet Take 1,000  mg by mouth 2 (two) times daily with a meal.     . Multiple Vitamin (MULTIVITAMIN WITH MINERALS) TABS tablet Take 1 tablet by mouth daily.    . nitroGLYCERIN (NITROSTAT) 0.4 MG SL tablet Place 0.4 mg under the tongue every 5 (five) minutes as needed for chest pain. For chest pain     . omeprazole (PRILOSEC) 20 MG capsule Take 20 mg by mouth 2 (two) times daily.      . ondansetron (ZOFRAN) 4 MG tablet Take 4 mg by mouth every 6 (six) hours as needed. For nausea    . POLY-IRON 150 150 MG capsule TAKE 1 CAPSULE BY MOUTH 2 TIMES DAILY. 60 capsule 5  . potassium chloride (K-DUR) 10 MEQ tablet Take 2 tablets by mouth 2 (two) times daily.    . vitamin B-12 (CYANOCOBALAMIN) 500 MCG tablet Take 500 mcg by mouth  daily.     No current facility-administered medications for this encounter.   BP 154/78 mmHg  Pulse 60  Wt 208 lb 4.8 oz (94.484 kg)  SpO2 95% General: NAD Neck: JVP 7 cm, no thyromegaly or thyroid nodule.  Lungs: Clear to auscultation bilaterally with normal respiratory effort. CV: Nondisplaced PMI.  Heart irregular S1/S2, no S3/S4, no murmur.  Trace ankle edema.  No carotid bruit.  Normal pedal pulses.  Abdomen: obese, soft, nontender, mild distention, no HSM,   Skin: Intact without lesions or rashes.  Neurologic: Alert and oriented x 3.  Psych: Normal affect. Extremities: No clubbing or cyanosis. R ankle brace. HEENT: Normal.    EKG: NSR 62 bpm   Assessment/Plan: 1. Chronic diastolic CHF: NYHA II.Volume status stable despite weight gain. Seems to be related to diet and inactivity. Continue Lasix to 40 qam/20 qpm.  2. Atrial fibrillation: Paroxysmal, holding NSR on amiodarone 200 mg daily. Continue apixaban. .No bleeding problems. Continue amiodarone to 200 mg daily.  Will need regular eye exam, serial LFTs and TSH.. These are followed by PCP.   Follow up 3-4 months with Dr Shirlee Latch.  Tiffanie Blassingame  NP-C   1:14 PM

## 2015-07-10 ENCOUNTER — Ambulatory Visit (HOSPITAL_COMMUNITY): Payer: Medicare Other

## 2015-07-10 ENCOUNTER — Encounter (HOSPITAL_COMMUNITY): Payer: Medicare Other | Attending: Oncology

## 2015-07-10 VITALS — BP 139/47 | HR 53 | Temp 97.9°F | Resp 16

## 2015-07-10 DIAGNOSIS — D509 Iron deficiency anemia, unspecified: Secondary | ICD-10-CM | POA: Insufficient documentation

## 2015-07-10 MED ORDER — SODIUM CHLORIDE 0.9 % IV SOLN
125.0000 mg | Freq: Once | INTRAVENOUS | Status: AC
  Administered 2015-07-10: 125 mg via INTRAVENOUS
  Filled 2015-07-10: qty 10

## 2015-07-10 MED ORDER — SODIUM CHLORIDE 0.9 % IV SOLN
INTRAVENOUS | Status: DC
  Administered 2015-07-10: 12:00:00 via INTRAVENOUS

## 2015-07-10 MED ORDER — SODIUM CHLORIDE 0.9 % IJ SOLN
10.0000 mL | Freq: Once | INTRAMUSCULAR | Status: AC
  Administered 2015-07-10: 10 mL via INTRAVENOUS

## 2015-07-10 NOTE — Patient Instructions (Signed)
[place] at [facility] Discharge Instructions  RECOMMENDATIONS MADE BY THE CONSULTANT AND ANY TEST RESULTS WILL BE SENT TO YOUR REFERRING PHYSICIAN.  Today you received Ferric Gluconate 125 mg iron infusion.  Return as scheduled for 2nd dose Ferric Gluconate next week.  Thank you for choosing [facility] at [facility] to provide your oncology and hematology care.  To afford each patient quality time with our provider, please arrive at least 15 minutes before your scheduled appointment time.    You need to re-schedule your appointment should you arrive 10 or more minutes late.  We strive to give you quality time with our providers, and arriving late affects you and other patients whose appointments are after yours.  Also, if you no show three or more times for appointments you may be dismissed from the clinic at the providers discretion.     Again, thank you for choosing [facility].  Our hope is that these requests will decrease the amount of time that you wait before being seen by our physicians.       _____________________________________________________________  Should you have questions after your visit to [facility], please contact our office at [phone] between the hours of 8:30 a.m. and 4:30 p.m.  Voicemails left after 4:30 p.m. will not be returned until the following business day.  For prescription refill requests, have your pharmacy contact our office.

## 2015-07-10 NOTE — Progress Notes (Signed)
Tolerated iron infusion well. Stable on discharge home with daughter via wheelchair.

## 2015-07-17 ENCOUNTER — Encounter (HOSPITAL_BASED_OUTPATIENT_CLINIC_OR_DEPARTMENT_OTHER): Payer: Medicare Other

## 2015-07-17 VITALS — BP 142/65 | HR 50 | Temp 98.2°F | Resp 18

## 2015-07-17 DIAGNOSIS — D509 Iron deficiency anemia, unspecified: Secondary | ICD-10-CM | POA: Diagnosis present

## 2015-07-17 MED ORDER — SODIUM CHLORIDE 0.9 % IV SOLN
125.0000 mg | Freq: Once | INTRAVENOUS | Status: AC
  Administered 2015-07-17: 125 mg via INTRAVENOUS
  Filled 2015-07-17: qty 10

## 2015-07-17 MED ORDER — SODIUM CHLORIDE 0.9 % IV SOLN
INTRAVENOUS | Status: DC
  Administered 2015-07-17: 13:00:00 via INTRAVENOUS

## 2015-07-17 NOTE — Patient Instructions (Signed)
[facility] at [facility] Discharge Instructions  RECOMMENDATIONS MADE BY THE CONSULTANT AND ANY TEST RESULTS WILL BE SENT TO YOUR REFERRING PHYSICIAN.  IV Iron today.    Thank you for choosing [facility] at [facility] to provide your oncology and hematology care.  To afford each patient quality time with our provider, please arrive at least 15 minutes before your scheduled appointment time.    You need to re-schedule your appointment should you arrive 10 or more minutes late.  We strive to give you quality time with our providers, and arriving late affects you and other patients whose appointments are after yours.  Also, if you no show three or more times for appointments you may be dismissed from the clinic at the providers discretion.     Again, thank you for choosing [facility].  Our hope is that these requests will decrease the amount of time that you wait before being seen by our physicians.       _____________________________________________________________  Should you have questions after your visit to [facility], please contact our office at [phone] between the hours of 8:30 a.m. and 4:30 p.m.  Voicemails left after 4:30 p.m. will not be returned until the following business day.  For prescription refill requests, have your pharmacy contact our office.     

## 2015-07-17 NOTE — Progress Notes (Signed)
Patient tolerated infusion well.  VSS.   

## 2015-07-22 ENCOUNTER — Ambulatory Visit (INDEPENDENT_AMBULATORY_CARE_PROVIDER_SITE_OTHER): Payer: Medicare Other | Admitting: Internal Medicine

## 2015-07-22 ENCOUNTER — Encounter: Payer: Self-pay | Admitting: Internal Medicine

## 2015-07-22 VITALS — BP 104/66 | HR 65 | Ht 65.0 in | Wt 202.0 lb

## 2015-07-22 DIAGNOSIS — R0600 Dyspnea, unspecified: Secondary | ICD-10-CM | POA: Diagnosis not present

## 2015-07-22 LAB — PULMONARY FUNCTION TEST
DL/VA % PRED: 93 %
DL/VA: 4.6 ml/min/mmHg/L
DLCO UNC % PRED: 56 %
DLCO UNC: 14.39 ml/min/mmHg
FEF 25-75 POST: 1.89 L/s
FEF 25-75 PRE: 1.85 L/s
FEF2575-%Change-Post: 2 %
FEF2575-%PRED-POST: 88 %
FEF2575-%PRED-PRE: 87 %
FEV1-%CHANGE-POST: 2 %
FEV1-%Pred-Post: 71 %
FEV1-%Pred-Pre: 69 %
FEV1-Post: 1.76 L
FEV1-Pre: 1.72 L
FEV1FVC-%Change-Post: 6 %
FEV1FVC-%PRED-PRE: 105 %
FEV6-%Change-Post: -2 %
FEV6-%PRED-POST: 66 %
FEV6-%Pred-Pre: 67 %
FEV6-Post: 2.05 L
FEV6-Pre: 2.11 L
FEV6FVC-%CHANGE-POST: 0 %
FEV6FVC-%Pred-Post: 104 %
FEV6FVC-%Pred-Pre: 103 %
FVC-%Change-Post: -3 %
FVC-%PRED-POST: 63 %
FVC-%Pred-Pre: 65 %
FVC-Post: 2.05 L
FVC-Pre: 2.12 L
POST FEV1/FVC RATIO: 86 %
PRE FEV1/FVC RATIO: 81 %
PRE FEV6/FVC RATIO: 99 %
Post FEV6/FVC ratio: 100 %
RV % pred: 64 %
RV: 1.4 L
TLC % pred: 65 %
TLC: 3.42 L

## 2015-07-22 NOTE — Assessment & Plan Note (Addendum)
Placed on amiodarone 01/29/15  04/10/15  FEV1  1.49 (60%) ratio 88 and dlco 53 corrects to 89% at amiodarone 400> reduced to 200 mg daily  04/21/2015  Walked RA x 2 laps @ 185 ft each stopped due to  Dizzy and legs gave out, nl pace, no desat  - PFT's  07/22/2015  FEV1 1.76 (71 % ) ratio 86  p 2 % improvement from saba with DLCO  56 % corrects to 93 % for alv volume on amio 200 mg  I had an extended discussion with the patient reviewing all relevant studies completed to date and  lasting 15 to 20 minutes of a 25 minute visit    No evidence of amio toxicity but always difficult to be sure as previous pfts were on the 400 mg dose and toxicity can be dose dependent; however, both her cough / breathing and tendency to afib are all better on 200 mg daily so rec no change rx pending cards f/u and return here in 6 m for 3rd "point on the curve" with pfts and walking sats then   Each maintenance medication was reviewed in detail including most importantly the difference between maintenance and prns and under what circumstances the prns are to be triggered using an action plan format that is not reflected in the computer generated alphabetically organized AVS.    Please see instructions for details which were reviewed in writing and the patient given a copy highlighting the part that I personally wrote and discussed at today's ov.

## 2015-07-22 NOTE — Progress Notes (Signed)
PFT done today. 

## 2015-07-22 NOTE — Progress Notes (Signed)
Subjective:    Patient ID: [name], female    DOB: [birth year],   MRN: [id number]    Brief patient profile:  65 yowf quit smoking 1984 no symptoms referred by Dr Shirlee Latch to pulmonary clinic 04/21/2015 with abn pfts p starting amiodarone  01/29/2015 for CAF     History of Present Illness  04/21/2015 1st [facility] office visit/ Justiss Gerbino   Chief Complaint  Patient presents with  . Pulmonary Consult    Referred by Dr. Marca Ancona for eval of abnormal PFT's . Pt had PFT done to est baseline before starting amiodarone. She c/o SOB and cough for the past 3 months. She states that she is SOB somtimes just sitting and walking short distances. Her cough is prod with clear sputum, mainly in the am.   able to [employer] pushing cart slow pace s sob  Cough x 2 years    Carries dx of gastroparesis on marinol and gaining wt  Cough is worst first thing in am x years  rec Add pepcid 20 mg at bedtime  GERD diet     07/22/2015  f/u ov/Breeze Berringer re:  ? Amiodarone tox  Chief Complaint  Patient presents with  . Follow-up    PFT done today. Pt states her breathing has improved. She still c/o occ cough with clear sputum.      cough much better in am since adding pepcid at hs/ limited by knee with activity/ not sob   No obvious day to day or daytime variability or assoc excess/ purulent sputum or mucus plugs or hemoptysis or cp or chest tightness, subjective wheeze or overt sinus or hb symptoms. No unusual exp hx or h/o childhood pna/ asthma or knowledge of premature birth.  Sleeping ok without nocturnal  or early am exacerbation  of respiratory  c/o's or need for noct saba. Also denies any obvious fluctuation of symptoms with weather or environmental changes or other aggravating or alleviating factors except as outlined above   Current Medications, Allergies, Complete Past Medical History, Past Surgical History, Family History, and Social History were reviewed in [employer]  record.  ROS  The following are not active complaints unless bolded sore throat, dysphagia, dental problems, itching, sneezing,  nasal congestion or excess/ purulent secretions, ear ache,   fever, chills, sweats, unintended wt loss, classically pleuritic or exertional cp,  orthopnea pnd or leg swelling, presyncope, palpitations, abdominal pain, anorexia, nausea, vomiting, diarrhea  or change in bowel or bladder habits, change in stools or urine, dysuria,hematuria,  rash, arthralgias, visual complaints, headache, numbness, weakness or ataxia or problems with walking or coordination,  change in mood/affect or memory.                          Objective:   Physical Exam  amb wf appears > stated age   07/22/2015       202  04/21/15 189 lb 3.2 oz (85.821 kg)  03/20/15 174 lb (78.926 kg)  03/16/15 172 lb (78.019 kg)    Vital signs reviewed   HEENT: nl dentition, turbinates, and orophanx. Nl external ear canals without cough reflex   NECK :  without JVD/Nodes/TM/ nl carotid upstrokes bilaterally   LUNGS: no acc muscle use, clear to A and P bilaterally without cough on insp or exp maneuvers   CV:  RRR  no s3 or murmur or increase in P2, min bilateral lower ext  edema s pitting  ABD:  Obese/ soft and nontender with nl excursion in the supine position. No bruits or organomegaly, bowel sounds nl  MS:  warm without deformities, calf tenderness, cyanosis or clubbing  SKIN: warm and dry without lesions    NEURO:  alert, approp, no deficits    Labs   reviewed   Lab Results  Component Value Date   ESRSEDRATE 20 04/21/2015    Lab Results  Component Value Date   PROBNP 470.0* 04/21/2015    CXR PA and Lateral:   04/21/2015 :    I personally reviewed images and agree with radiology impression as follows:   Cardiomegaly without decompensation. Chronic changes in the left mid and lower lung (min elevation of L HD/ overall better aeration than prev studies)      Assessment &  Plan:

## 2015-07-22 NOTE — Patient Instructions (Signed)
Walk as much as your knee will allow  Return for increase cough or difficulty with breathing with exertion   Please schedule a follow up visit in 6  months but call sooner if needed with pfts on return - cancel if you stop amiodarone in the meantime

## 2015-07-24 ENCOUNTER — Ambulatory Visit (HOSPITAL_COMMUNITY): Payer: Medicare Other

## 2015-07-31 ENCOUNTER — Ambulatory Visit (HOSPITAL_COMMUNITY)
Admission: RE | Admit: 2015-07-31 | Discharge: 2015-07-31 | Disposition: A | Discharge status: Home / Self Care | Payer: Medicare Other | Source: Ambulatory Visit | Attending: Family Medicine | Admitting: Family Medicine

## 2015-07-31 ENCOUNTER — Other Ambulatory Visit (HOSPITAL_COMMUNITY): Payer: Self-pay | Admitting: Family Medicine

## 2015-07-31 ENCOUNTER — Ambulatory Visit (HOSPITAL_COMMUNITY)
Admission: RE | Admit: 2015-07-31 | Discharge: 2015-07-31 | Disposition: A | Discharge status: Home / Self Care | Payer: Medicare Other | Source: Ambulatory Visit | Attending: Hematology & Oncology | Admitting: Hematology & Oncology

## 2015-07-31 DIAGNOSIS — M25561 Pain in right knee: Secondary | ICD-10-CM | POA: Insufficient documentation

## 2015-07-31 DIAGNOSIS — M85861 Other specified disorders of bone density and structure, right lower leg: Secondary | ICD-10-CM | POA: Diagnosis not present

## 2015-07-31 DIAGNOSIS — Z1231 Encounter for screening mammogram for malignant neoplasm of breast: Secondary | ICD-10-CM | POA: Diagnosis not present

## 2015-07-31 DIAGNOSIS — W19XXXA Unspecified fall, initial encounter: Secondary | ICD-10-CM | POA: Diagnosis not present

## 2015-07-31 DIAGNOSIS — I739 Peripheral vascular disease, unspecified: Secondary | ICD-10-CM | POA: Insufficient documentation

## 2015-07-31 DIAGNOSIS — S8991XA Unspecified injury of right lower leg, initial encounter: Secondary | ICD-10-CM | POA: Diagnosis not present

## 2015-07-31 DIAGNOSIS — E1165 Type 2 diabetes mellitus with hyperglycemia: Secondary | ICD-10-CM | POA: Diagnosis not present

## 2015-07-31 DIAGNOSIS — R634 Abnormal weight loss: Secondary | ICD-10-CM | POA: Diagnosis not present

## 2015-08-14 ENCOUNTER — Other Ambulatory Visit: Payer: Self-pay | Admitting: Gastroenterology

## 2015-08-17 ENCOUNTER — Encounter: Payer: Self-pay | Admitting: Gastroenterology

## 2015-08-17 ENCOUNTER — Ambulatory Visit (INDEPENDENT_AMBULATORY_CARE_PROVIDER_SITE_OTHER): Payer: Medicare Other | Admitting: Adult Health

## 2015-08-17 ENCOUNTER — Encounter: Payer: Self-pay | Admitting: Adult Health

## 2015-08-17 VITALS — BP 112/64 | HR 87 | Ht 66.0 in | Wt 209.0 lb

## 2015-08-17 DIAGNOSIS — I480 Paroxysmal atrial fibrillation: Secondary | ICD-10-CM | POA: Diagnosis not present

## 2015-08-17 DIAGNOSIS — K589 Irritable bowel syndrome without diarrhea: Secondary | ICD-10-CM | POA: Diagnosis not present

## 2015-08-17 DIAGNOSIS — E6609 Other obesity due to excess calories: Secondary | ICD-10-CM | POA: Diagnosis not present

## 2015-08-17 DIAGNOSIS — I5032 Chronic diastolic (congestive) heart failure: Secondary | ICD-10-CM | POA: Diagnosis not present

## 2015-08-17 DIAGNOSIS — M25561 Pain in right knee: Secondary | ICD-10-CM | POA: Diagnosis not present

## 2015-08-17 NOTE — Patient Instructions (Signed)
Your physician wants you to follow-up in: 6 months with Dr. Branch. You will receive a reminder letter in the mail two months in advance. If you don't receive a letter, please call our office to schedule the follow-up appointment.  Your physician recommends that you continue on your current medications as directed. Please refer to the Current Medication list given to you today.  If you need a refill on your cardiac medications before your next appointment, please call your pharmacy.  Thank you for choosing [facility]!   

## 2015-08-17 NOTE — Progress Notes (Signed)
Name: [name]    DOB: 1950-08-02  Age: 65 y.o.  MR#: [id number]       PCP:  [name], MD      Insurance: Payor: MEDICARE / Plan: MEDICARE PART A AND B / Product Type: *No Product type* /   CC:   No chief complaint on file.   VS Filed Vitals:   08/17/15 1529  BP: 112/64  Pulse: 87  Height:  (1.676 m)  Weight: 209 lb (94.802 kg)  SpO2: 91%    Weights Current Weight  08/17/15 209 lb (94.802 kg)  07/22/15 202 lb (91.627 kg)  07/08/15 208 lb 4.8 oz (94.484 kg)    Blood Pressure  BP Readings from Last 3 Encounters:  08/17/15 112/64  07/22/15 104/66  07/17/15 142/65     Admit date:  (Not on file) Last encounter with RMR:  04/27/2015   Allergy Ibuprofen; Codeine; Celexa; and Reglan  Current Outpatient Prescriptions  Medication Sig Dispense Refill  . acetaminophen (TYLENOL) 500 MG tablet Take 500 mg by mouth every 6 (six) hours as needed for moderate pain or headache.    Marland Kitchen amiodarone (PACERONE) 200 MG tablet Take 1 tablet (200 mg total) by mouth daily. 30 tablet 6  . apixaban (ELIQUIS) 5 MG TABS tablet Take 5 mg by mouth 2 (two) times daily.    Marland Kitchen dicyclomine (BENTYL) 10 MG capsule Take 10 mg by mouth 2 (two) times daily as needed for spasms.    Marland Kitchen dronabinol (MARINOL) 2.5 MG capsule Take 2.5 mg by mouth 3 (three) times daily.    . DULoxetine (CYMBALTA) 30 MG capsule Take 30 mg by mouth 2 (two) times daily.    . famotidine (PEPCID) 20 MG tablet Take 20 mg by mouth at bedtime.    . furosemide (LASIX) 20 MG tablet Take 20 mg by mouth daily. Take with 40 mg for a total of 60 mg daily    . gabapentin (NEURONTIN) 300 MG capsule Take 300 mg by mouth 3 (three) times daily.    Marland Kitchen glipiZIDE (GLUCOTROL) 10 MG tablet Take 10 mg by mouth 2 (two) times daily before a meal.     . HYDROcodone-acetaminophen (NORCO/VICODIN) 5-325 MG tablet Take 1 tablet by mouth 2 (two) times daily.    . Insulin Glargine (LANTUS SOLOSTAR) 100 UNIT/ML Solostar Pen Inject 20-50 Units into the skin  at bedtime as needed. Patient takes 20 units every night at bedtime if over 178.  Takes 50 units if her blood sugar is above 300.    . levETIRAcetam (KEPPRA) 500 MG tablet Take 500 mg by mouth 2 (two) times daily.    Marland Kitchen levothyroxine (SYNTHROID, LEVOTHROID) 137 MCG tablet Take 137 mcg by mouth daily.    Marland Kitchen loperamide (IMODIUM) 2 MG capsule TAKE (1) CAPSULE THREE TIMES DAILY AS NEEDED FOR DIARRHEA OR LOOSE STOOLS. 30 capsule 3  . LORazepam (ATIVAN) 1 MG tablet Take 1 mg by mouth 3 (three) times daily as needed for anxiety or sleep. For anxiety    . magnesium oxide (MAG-OX) 400 MG tablet Take 400 mg by mouth daily.     . metaxalone (SKELAXIN) 800 MG tablet Take 800 mg by mouth 3 (three) times daily.     . metFORMIN (GLUCOPHAGE) 500 MG tablet Take 1,000 mg by mouth 2 (two) times daily with a meal.     . Multiple Vitamin (MULTIVITAMIN WITH MINERALS) TABS tablet Take 1 tablet by mouth daily.    . nitroGLYCERIN (NITROSTAT) 0.4 MG SL  tablet Place 0.4 mg under the tongue every 5 (five) minutes as needed for chest pain. Reported on 07/08/2015    . omeprazole (PRILOSEC) 20 MG capsule Take 20 mg by mouth 2 (two) times daily.      . ondansetron (ZOFRAN) 4 MG tablet Take 4 mg by mouth every 6 (six) hours as needed. For nausea    . POLY-IRON 150 150 MG capsule TAKE 1 CAPSULE BY MOUTH 2 TIMES DAILY. 60 capsule 5  . potassium chloride (K-DUR) 10 MEQ tablet Take 2 tablets by mouth 2 (two) times daily.    . vitamin B-12 (CYANOCOBALAMIN) 500 MCG tablet Take 500 mcg by mouth daily.     No current facility-administered medications for this visit.    Discontinued Meds:   There are no discontinued medications.  Patient Active Problem List   Diagnosis Date Noted  . IDA (iron deficiency anemia) 03/13/2015  . Chronic diastolic CHF (congestive heart failure) (HCC) 02/20/2015  . UTI (urinary tract infection) 02/07/2015  . Syncope and collapse 02/05/2015  . Dehydration   . Acute on chronic combined systolic and  diastolic CHF (congestive heart failure) (HCC) 01/26/2015  . Low hemoglobin   . Diffuse abdominal pain 09/16/2014  . Ascites 08/27/2014  . Anemia 07/20/2014  . Epistaxis, recurrent 07/20/2014  . Syncope 04/11/2014  . Dementia 04/11/2014  . Chronic diarrhea 04/11/2014  . DM type 2 (diabetes mellitus, type 2) (HCC) 04/11/2014  . Protein-calorie malnutrition, severe (HCC) 04/11/2014  . Acute CHF (HCC) 12/21/2013  . Atrial fibrillation with RVR (HCC) 12/21/2013  . Chest pain at rest 12/03/2013  . Obesity 12/03/2013  . Chest pain 12/03/2013  . Diarrhea 08/26/2013  . Abnormal weight loss 08/26/2013  . Lower abdominal pain 08/26/2013  . Anorexia nervosa 08/26/2013  . Encounter for therapeutic drug monitoring 08/26/2013  . Dyspnea 06/29/2013  . Tremor 10/27/2012  . Chronic anticoagulation 06/23/2011  . HYPOTENSION, ORTHOSTATIC 08/26/2009  . Hyperlipidemia 03/04/2009  . Barrett's esophagus 09/26/2008  . Gastroparesis 09/26/2008  . Iron deficiency anemia 09/25/2008  . Essential hypertension 09/25/2008  . LUNG NODULE 09/25/2008  . Irritable bowel syndrome 09/25/2008  . SLEEP APNEA 09/25/2008  . CARPAL TUNNEL SYNDROME, HX OF 09/25/2008  . Hypothyroidism 04/10/2008  . DIABETES MELLITUS, TYPE II, ON INSULIN 04/10/2008  . OBESITY 04/10/2008  . Atrial fibrillation (HCC) 04/10/2008  . Congestive heart failure (HCC) 04/10/2008  . LOW BACK PAIN, CHRONIC 04/10/2008  . CHEST PAIN-PRECORDIAL 04/10/2008    LABS    Component Value Date/Time   NA 137 03/20/2015 1230   NA 137 03/06/2015 1113   NA 139 02/13/2015 1414   K 5.0 03/20/2015 1230   K 4.9 03/06/2015 1113   K 4.7 02/13/2015 1414   CL 105 03/20/2015 1230   CL 104 02/13/2015 1414   CL 101 02/06/2015 0524   CO2 23 03/20/2015 1230   CO2 24 02/13/2015 1414   CO2 29 02/06/2015 0524   GLUCOSE 120* 03/20/2015 1230   GLUCOSE 207* 03/06/2015 1113   GLUCOSE 76 02/13/2015 1414   BUN 18 03/20/2015 1230   BUN 23 02/13/2015 1414   BUN  36* 02/06/2015 0524   CREATININE 0.85 03/20/2015 1230   CREATININE 0.70 02/13/2015 1414   CREATININE 0.95 02/06/2015 0524   CREATININE 1.20* 02/05/2015 0321   CREATININE 0.75 11/29/2014 0803   CREATININE 0.68 10/04/2014 1142   CALCIUM 9.2 03/20/2015 1230   CALCIUM 8.9 02/13/2015 1414   CALCIUM 9.2 02/06/2015 0524   CALCIUM 8.4 06/30/2013 0318  GFRNONAA >60 03/20/2015 1230   GFRNONAA >60 02/06/2015 0524   GFRNONAA 46* 02/05/2015 0321   GFRNONAA 78 08/26/2013 0921   GFRAA >60 03/20/2015 1230   GFRAA >60 02/06/2015 0524   GFRAA 54* 02/05/2015 0321   GFRAA >89 08/26/2013 0921   CMP     Component Value Date/Time   NA 137 03/20/2015 1230   K 5.0 03/20/2015 1230   CL 105 03/20/2015 1230   CO2 23 03/20/2015 1230   GLUCOSE 120* 03/20/2015 1230   BUN 18 03/20/2015 1230   CREATININE 0.85 03/20/2015 1230   CREATININE 0.70 02/13/2015 1414   CALCIUM 9.2 03/20/2015 1230   CALCIUM 8.4 06/30/2013 0318   PROT 6.3 02/13/2015 1414   ALBUMIN 3.5* 02/13/2015 1414   AST 12 02/13/2015 1414   ALT 7 02/13/2015 1414   ALKPHOS 81 02/13/2015 1414   BILITOT 0.5 02/13/2015 1414   GFRNONAA >60 03/20/2015 1230   GFRNONAA 78 08/26/2013 0921   GFRAA >60 03/20/2015 1230   GFRAA >89 08/26/2013 0921       Component Value Date/Time   WBC 7.7 06/18/2015 1313   WBC 6.4 03/16/2015 1031   WBC 7.0 02/13/2015 1414   HGB 13.6 06/18/2015 1313   HGB 11.9* 03/16/2015 1031   HGB 12.2 03/06/2015 1113   HCT 41.2 06/18/2015 1313   HCT 38.2 03/16/2015 1031   HCT 36.0 03/06/2015 1113   MCV 96.7 06/18/2015 1313   MCV 86.6 03/16/2015 1031   MCV 81.3 02/13/2015 1414    Lipid Panel     Component Value Date/Time   CHOL 77 09/02/2011 0446   TRIG 88 09/02/2011 0446   HDL 35* 09/02/2011 0446   CHOLHDL 2.2 09/02/2011 0446   VLDL 18 09/02/2011 0446   LDLCALC 24 09/02/2011 0446    ABG    Component Value Date/Time   PHART 7.412 01/10/2014 1411   PCO2ART 34.8* 01/10/2014 1411   PO2ART 135.0* 01/10/2014 1411    HCO3 25.6* 01/26/2015 1132   TCO2 27 01/26/2015 1132   ACIDBASEDEF 1.0 01/26/2015 1132   O2SAT 59.0 01/26/2015 1132     Lab Results  Component Value Date   TSH 3.854 02/13/2015   BNP (last 3 results)  Recent Labs  01/26/15 1149 01/29/15 0410  BNP 566.0* 265.7*    ProBNP (last 3 results)  Recent Labs  04/21/15 1548  PROBNP 470.0*    Cardiac Panel (last 3 results) No results for input(s): CKTOTAL, CKMB, TROPONINI, RELINDX in the last 72 hours.  Iron/TIBC/Ferritin/ %Sat    Component Value Date/Time   IRON 29 12/26/2014 1705   TIBC 458* 12/26/2014 1705   FERRITIN 39 06/18/2015 1314   IRONPCTSAT 6* 12/26/2014 1705   IRONPCTSAT 8* 10/04/2014 1140     EKG Orders placed or performed during the hospital encounter of 07/08/15  . EKG 12-Lead  . EKG 12-Lead     Prior Assessment and Plan Problem List as of 08/17/2015      Cardiovascular and Mediastinum   Essential hypertension   Last Assessment & Plan 08/27/2014 Office Visit Written 08/27/2014 10:52 AM by Dyann Kief, PA-C    Blood pressure is on the low side but no dizziness or presyncope.      Atrial fibrillation [facility])   Last Assessment & Plan 08/27/2014 Office Visit Written 08/27/2014 10:52 AM by Dyann Kief, PA-C    Patient remains in atrial fibrillation with controlled rate on low-dose metoprolol. She is also on Eliquis 5 mg twice a day. Continue both.  Recent nosebleed in January without recurrence.      Congestive heart failure [facility])   Last Assessment & Plan 12/25/2013 Office Visit Written 12/25/2013  3:22 PM by Jodelle Gross, NP    No evidence of fluid overload currently. Blood pressures on the low side. I decreased her Lasix from 40 mg daily to 20 mg lately and she is having some symptoms of dizziness. She is avoiding salty foods and not adding salt to her foods at all. She is continuing to lose weight and is no evidence of edema in the lower extremities. She will have precatheterization labs to  evaluate kidney function. On discharge her kidney function was normal at 0.78 on 12/22/2013.      HYPOTENSION, ORTHOSTATIC   Last Assessment & Plan 10/26/2012 Office Visit Written 10/27/2012 12:31 PM by Kathlen Brunswick, MD    No orthostatic change in blood pressure at today's visit.      Acute CHF [facility])   Last Assessment & Plan 08/27/2014 Office Visit Written 08/27/2014 10:51 AM by Dyann Kief, PA-C    Patient has 30 pound weight gain with ascites and lower extremity edema. Will attempt outpatient diuresis, but advised patient she may need IV diuresis and admission to the hospital. Patient does not improve in the next day or so she is advised to come to the emergency room.      Atrial fibrillation with RVR (HCC)   Syncope   Acute on chronic combined systolic and diastolic CHF (congestive heart failure) (HCC)   Syncope and collapse   Chronic diastolic CHF (congestive heart failure) (HCC)     Respiratory   LUNG NODULE   Epistaxis, recurrent     Digestive   Chronic diarrhea   Last Assessment & Plan 09/16/2014 Office Visit Written 09/21/2014 12:08 AM by Tiffany Kocher, PA-C    Likely multifactorial. Currently stable. Patient request trial of Xifaxan. We will see if insurance will approve. Continue imodium for now.       Barrett's esophagus   Last Assessment & Plan 09/16/2014 Office Visit Written 09/21/2014 12:09 AM by Tiffany Kocher, PA-C    Due repeat EGD this year. Patient not ready to schedule at this time. Continue PPI.       Gastroparesis   Last Assessment & Plan 03/13/2015 Office Visit Written 03/13/2015 12:19 PM by Tiffany Kocher, PA-C    Takes Zofran every morning to PREVENT nausea. Taking Marinol after meals. Suggested taking Marinol 30-60 minutes prior to breakfast and evening meal. Try to limit zofran, use only if needed. May be contributing to constipation. Return to office in six months.       Irritable bowel syndrome   Last Assessment & Plan 03/13/2015 Office Visit Edited  03/13/2015 12:21 PM by Tiffany Kocher, PA-C    Alternating constipation/diarrhea, low abdominal pain. Likely due to IBS. Encouraged trial of bentyl when pain develops. She has had no pain since July. She will call with further problems.   Add 1/2-1 capful of miralax as needed during constipation.         Endocrine   DM type 2 (diabetes mellitus, type 2) (HCC)   Hypothyroidism   DIABETES MELLITUS, TYPE II, ON INSULIN   Last Assessment & Plan 09/01/2011 Clinical Support Written 09/01/2011 10:06 PM by Kathlen Brunswick, MD    Patient reports improvement in diabetic control with recent 40 pound weight loss.  She was congratulated on this achievement.        Nervous  and Auditory   Dementia     Genitourinary   UTI (urinary tract infection)     Other   Hyperlipidemia   Last Assessment & Plan 12/26/2012 Office Visit Written 12/26/2012  3:30 PM by Kathlen Brunswick, MD    Extremely low total and LDL cholesterol. Current lipid-lowering therapy is effective and will be continued.      OBESITY   Last Assessment & Plan 09/01/2011 Clinical Support Written 09/01/2011 10:08 PM by Kathlen Brunswick, MD    Patient reports a 40 pound weight loss.  No weight contained in conjunction with today's visit.      Iron deficiency anemia   Last Assessment & Plan 06/18/2015 Office Visit Written 06/18/2015  9:33 AM by Ellouise Newer, PA-C    Iron deficiency anemia having undergone an extensive GI work-up that was negative including colonoscopy (11/08/2013), EGD (11/21/2014), and camera capsule study.   Labs today: CBC diff, ferritin  She is overdue for mammogram.  We will get that scheduled for her.  Labs in 3 months: CBC, ferritin  Labs in 6 months: CBC diff, iron/TIBC, ferritin  We will replace her iron as indicated with the appropriate dose according to her iron deficit.  Return in 6 months for follow-up      LOW BACK PAIN, CHRONIC   SLEEP APNEA   CHEST PAIN-PRECORDIAL   Last Assessment & Plan  09/01/2011 Clinical Support Written 09/03/2011  3:53 PM by Kathlen Brunswick, MD    No recent chest discomfort.      CARPAL TUNNEL SYNDROME, HX OF   Chronic anticoagulation   Last Assessment & Plan 10/26/2012 Office Visit Written 10/27/2012 12:30 PM by Kathlen Brunswick, MD    Mild anemia persists, likely do to chronic disease although previously characterized as iron deficiency. FOBT will be repeated.      Tremor   Last Assessment & Plan 12/26/2012 Office Visit Written 12/26/2012  3:31 PM by Kathlen Brunswick, MD    Tardive dyskinesia is more prominent than I have noted in the past. Metoclopramide may be contributing. Patient is scheduled for reassessment by Dr. Gerilyn Pilgrim.      Dyspnea   Last Assessment & Plan 07/22/2015 Office Visit Edited 07/22/2015  1:41 PM by Nyoka Cowden, MD    Placed on amiodarone 01/29/15  04/10/15  FEV1  1.49 (60%) ratio 88 and dlco 53 corrects to 89% at amiodarone 400> reduced to 200 mg daily  04/21/2015  Walked RA x 2 laps @ 185 ft each stopped due to  Dizzy and legs gave out, nl pace, no desat  - PFT's  07/22/2015  FEV1 1.76 (71 % ) ratio 86  p 2 % improvement from saba with DLCO  56 % corrects to 93 % for alv volume on amio 200 mg  I had an extended discussion with the patient reviewing all relevant studies completed to date and  lasting 15 to 20 minutes of a 25 minute visit    No evidence of amio toxicity but always difficult to be sure as previous pfts were on the 400 mg dose and toxicity can be dose dependent; however, both her cough / breathing and tendency to afib are all better on 200 mg daily so rec no change rx pending cards f/u and return here in 6 m for 3rd "point on the curve" with pfts and walking sats then   Each maintenance medication was reviewed in detail including most importantly the difference between maintenance and prns and  under what circumstances the prns are to be triggered using an action plan format that is not reflected in the computer generated  alphabetically organized AVS.    Please see instructions for details which were reviewed in writing and the patient given a copy highlighting the part that I personally wrote and discussed at today's ov.       Diarrhea   Abnormal weight loss   Last Assessment & Plan 03/31/2014 Office Visit Edited 04/01/2014  4:51 PM by Tiffany Kocher, PA-C    Continued weight loss, over 60 pounds at this point. Ongoing anorexia, vomiting, chronic abdominal pain, diarrhea. History of chronic iron deficiency anemia although her hemoglobin looks good at this time. Dietary recall reveals patient is consuming limited fluid and solid food concerning to her weight loss. Extensive workup as outlined above. Not clear if gastroparesis is the primary culprit for her anorexia weight loss, consider mesenteric ischemia as well. Previously advised to try pancreatic enzymes but she really has not pursued this option. Has developed tardive dyskinesia on Reglan. Discussed at length with family/patient and they would like to try coming off the medication.  1. Take Zofran Three times per day before meals. 2. Take Marinol twice per day before meals. 3. Take Zenpep 2 with meals and 1 with snacks. Samples provided. If they help your diarrhea, call for samples. 4. Stop Reglan due to abnormal tongue, mouth motions. 5. Please try to sip on liquids all day to make your urine light yellow. You need to attempt to eat a couple of Boost or Ensure daily. Eat small snacks every 2 to 3 hours. You will continue to lose weight if you don't try to eat.  I will discuss with Dr. Darrick Penna. Further recommendations to follow.        Lower abdominal pain   Anorexia nervosa   Encounter for therapeutic drug monitoring   Chest pain at rest   Obesity   Chest pain   Protein-calorie malnutrition, severe (HCC)   Anemia   Last Assessment & Plan 09/16/2014 Office Visit Written 09/21/2014 12:23 AM by Tiffany Kocher, PA-C    Labs planned for next week. Change in  anemia in part due to recurrent epistaxis. Last TCS and small givens capsule in 2015. Last EGD 2013. Await labs.       Ascites   Last Assessment & Plan 08/27/2014 Office Visit Written 08/27/2014 10:48 AM by Dyann Kief, PA-C    Patient has significant ascites and lower extremity edema with 30 pound weight gain. I discussed possible hospitalization with her but she would like to try outpatient treatment first. We will begin Lasix 40 mg twice a day increase her potassium to 20 mEq 4 times a day. If she does not improve in the next 2 days she is advised to go to the emergency room to be admitted for IV diuresis. 2 g sodium diet. Follow-up with Dr. Wyline Mood next week. We are trying to obtain blood work that was performed on her yesterday.      Diffuse abdominal pain   Last Assessment & Plan 09/16/2014 Office Visit Written 09/21/2014 12:13 AM by Tiffany Kocher, PA-C    New abd pain for four weeks associated with new-onset ascites of unclear etiology, ?related to cardiac etiology. Hepatomegaly noted on recent CT. Abdominal pain worse with BM. To discuss further with Dr. Darrick Penna with regards to pain, new-onset ascites. Further recommendations to follow.       Low hemoglobin   Dehydration  IDA (iron deficiency anemia)       Imaging: Dg Knee Complete 4 Views Right  07/31/2015  CLINICAL DATA:  Fall. EXAM: RIGHT KNEE - COMPLETE 4+ VIEW COMPARISON:  10/04/2012.  01/20/2012. FINDINGS: Deformity noted the proximal fibula. This is most likely a site of prior fracture with callus formation. Superimposed acute fracture may also be present. Remaining bony structures are intact. Tibial plateau intact. Diffuse osteopenia degenerative change. Chondrocalcinosis, most likely degenerative noted. Peripheral vascular calcification present. IMPRESSION: 1. Deformity noted proximal fibula suggesting callus from prior fracture. Superimposed acute fracture cannot be excluded. No significant displacement. 2. Diffuse osteopenia  degenerative change. 3. Peripheral vascular disease . Electronically Signed   By: Maisie Fus  Register   On: 07/31/2015 14:10   Mm Screening Breast Tomo Bilateral  08/04/2015  CLINICAL DATA:  Screening. EXAM: DIGITAL SCREENING BILATERAL MAMMOGRAM WITH 3D TOMO WITH CAD COMPARISON:  Previous exam(s). ACR Breast Density Category b: There are scattered areas of fibroglandular density. FINDINGS: There are no findings suspicious for malignancy. Images were processed with CAD. IMPRESSION: No mammographic evidence of malignancy. A result letter of this screening mammogram will be mailed directly to the patient. RECOMMENDATION: Screening mammogram in one year. (Code:SM-[id number]) BI-RADS CATEGORY  1: Negative. Electronically Signed   By: Frederico Hamman M.D.   On: 08/04/2015 09:57

## 2015-08-17 NOTE — Progress Notes (Signed)
Cardiology Office Note   Date:  08/17/2015   ID:  Breeze, Berringer 1950/12/05, MRN [id number]  PCP:  [name], MD  Cardiologist:  [place]/ [place]/ Joni Reining, NP   No chief complaint on file.     History of Present Illness: [name] is a 65 y.o. female who presents for ongoing assessment and management of chronic atrial fibrillation, chronic diastolic heart failure,it was last seen by Dr. Sherlie Ban in August of 2016.  She had a recent hospitalization in August of 2016 due to decompensated heart failure, where she was diureses with IV Lasix and started on Lasix 60 mg twice a day.however this was adjusted due to some hypotension, and therefore, is now on 40/20.  Because of atrial fibrillation, she was started on amiodarone, she was continued on ELIQUIS. She underwent DCCV by Dr. Shirlee Latch on 03/06/2015, and was cardioverted to normal sinus rhythm.she is also being followed by Dr. Sherene Sires, pulmonology and was seen by him in January 2017.  And she with complaints of leg pain, she apparently took a fall in August and fractured her tibia, but was hairline, and, therefore, she did not have any kind of surgical repair.  She is only on pain management.  She also has a history of anemia and is being followed by oncology.  She denies any chest pain, dyspnea, dizziness, or any more falls.  She has gained a little bit of weight, but she has been very sedentary since her fall,  Past Medical History  Diagnosis Date  . Chest pain     Negative cardiac catheterization in 2002; negative stress nuclear study in 2008  . Chronic diastolic CHF (congestive heart failure) (HCC)     a. EF predominantly normal during prior echoes but was 40% during 10/2014 echo. b. Most recent 01/2015 EF normal, 55-60%.  . Paroxysmal atrial fibrillation (HCC)   . Chronic anticoagulation   . Diabetes mellitus, type 2 (HCC)     Insulin therapy; exacerbated by prednisone  . Syncope     a. Admitted 05/2009; magnetic  resonance imagin/ MRA - negative; etiology thought to be orthostasis secondary to drugs and dehydration. b. Syncope 02/2015 also felt 2/2 dehydration.  Marland Kitchen GERD (gastroesophageal reflux disease)   . IBS (irritable bowel syndrome)   . Hiatal hernia   . Gastroparesis     99% retention 05/2008 on GES  . Hyperlipidemia   . Hypertension   . Hypothyroid   . Chronic LBP     Surgical intervention in 1996  . Obesity   . OSA on CPAP   . Anemia     H/H of 10/30 with a normal MCV in 12/09  . Barrett's esophagus     Diagnosed 1995. Last EGD 2016-NO BARRETT'S.   . Seizures (HCC)   . Pulmonary hypertension (HCC) 01/2015    a. Predominantly pulmonary venous hypertension but may be component of PAH.    Past Surgical History  Procedure Laterality Date  . Cardiovascular stress test  2008    Stress nuclear study  . Back surgery  1996  . Laparoscopic cholecystectomy  1990s  . Total abdominal hysterectomy  1999  . Laminectomy  1995    L4-L5  . Carpal tunnel release  1994  . Esophagogastroduodenoscopy  2008    Barrett's without dysplasia. esphagus dilated. antral erosions, h.pylori serologies negative.  . Colonoscopy  11/2011    Dr. Darrick Penna: Internal hemorrhoids, mild diverticulosis. Random colon biopsies negative.  . Givens capsule study  12/07/2011  Proximal small bowel, rare AVM. Distal small bowel, multiple ulcers noted  . Esophagogastroduodenoscopy  11/2011    Dr. Darrick Penna: Barrett's esophagus, mild gastritis, diverticulum in the second portion of the duodenum repeat EGD 3 years. Small bowel biopsies negative. Gastric biopsy show reactive gastropathy but no H. pylori. Esophageal biopsies consistent with GERD. Next EGD 11/2014  . Givens capsule study N/A 09/27/2013    Distal small bowel ulcers extending to TI.  Marland Kitchen Givens capsule study N/A 10/10/2013    Procedure: GIVENS CAPSULE STUDY;  Surgeon: West Bali, MD;  Location: [facility];  Service: Endoscopy;  Laterality: N/A;  7:30  . Colonoscopy N/A  11/08/2013    SLF: Normal mucosa in the terminal ileum/The colon IS redundant/  Moderate diverticulosis throughout the entire colon. ileum bx benign. colon bx benign  . Biopsy N/A 11/08/2013    Procedure: BIOPSY  / Tissue sampling / ulcers present in small intestine;  Surgeon: West Bali, MD;  Location: [facility];  Service: Endoscopy;  Laterality: N/A;  . Cardiac catheterization  2002  . Right heart catheterization N/A 01/10/2014    Procedure: RIGHT HEART CATH;  Surgeon: Lesleigh Noe, MD;  Location: [facility] CATH LAB;  Service: Cardiovascular;  Laterality: N/A;  . Left heart catheterization with coronary angiogram  01/10/2014    Procedure: LEFT HEART CATHETERIZATION WITH CORONARY ANGIOGRAM;  Surgeon: Lesleigh Noe, MD;  Location: [facility] CATH LAB;  Service: Cardiovascular;;  . Esophagogastroduodenoscopy N/A 11/21/2014    ZOX:WRUE non-erosive gastritis/irregular z-line  . Cardiac catheterization N/A 01/26/2015    Procedure: Right Heart Cath;  Surgeon: Laurey Morale, MD;  Location: [facility] INVASIVE CV LAB;  Service: Cardiovascular;  Laterality: N/A;  . Cardioversion N/A 03/06/2015    Procedure: CARDIOVERSION;  Surgeon: Laurey Morale, MD;  Location: [facility] ENDOSCOPY;  Service: Cardiovascular;  Laterality: N/A;     Current Outpatient Prescriptions  Medication Sig Dispense Refill  . acetaminophen (TYLENOL) 500 MG tablet Take 500 mg by mouth every 6 (six) hours as needed for moderate pain or headache.    Marland Kitchen amiodarone (PACERONE) 200 MG tablet Take 1 tablet (200 mg total) by mouth daily. 30 tablet 6  . apixaban (ELIQUIS) 5 MG TABS tablet Take 5 mg by mouth 2 (two) times daily.    Marland Kitchen dicyclomine (BENTYL) 10 MG capsule Take 10 mg by mouth 2 (two) times daily as needed for spasms.    Marland Kitchen dronabinol (MARINOL) 2.5 MG capsule Take 2.5 mg by mouth 3 (three) times daily.    . DULoxetine (CYMBALTA) 30 MG capsule Take 30 mg by mouth 2 (two) times daily.    . famotidine (PEPCID) 20 MG tablet Take 20 mg by mouth at  bedtime.    . furosemide (LASIX) 20 MG tablet Take 20 mg by mouth daily. Take with 40 mg for a total of 60 mg daily    . gabapentin (NEURONTIN) 300 MG capsule Take 300 mg by mouth 3 (three) times daily.    Marland Kitchen glipiZIDE (GLUCOTROL) 10 MG tablet Take 10 mg by mouth 2 (two) times daily before a meal.     . HYDROcodone-acetaminophen (NORCO/VICODIN) 5-325 MG tablet Take 1 tablet by mouth 2 (two) times daily.    . Insulin Glargine (LANTUS SOLOSTAR) 100 UNIT/ML Solostar Pen Inject 20-50 Units into the skin at bedtime as needed. Patient takes 20 units every night at bedtime if over 178.  Takes 50 units if her blood sugar is above 300.    . levETIRAcetam (  KEPPRA) 500 MG tablet Take 500 mg by mouth 2 (two) times daily.    Marland Kitchen levothyroxine (SYNTHROID, LEVOTHROID) 137 MCG tablet Take 137 mcg by mouth daily.    Marland Kitchen loperamide (IMODIUM) 2 MG capsule TAKE (1) CAPSULE THREE TIMES DAILY AS NEEDED FOR DIARRHEA OR LOOSE STOOLS. 30 capsule 3  . LORazepam (ATIVAN) 1 MG tablet Take 1 mg by mouth 3 (three) times daily as needed for anxiety or sleep. For anxiety    . magnesium oxide (MAG-OX) 400 MG tablet Take 400 mg by mouth daily.     . metaxalone (SKELAXIN) 800 MG tablet Take 800 mg by mouth 3 (three) times daily.     . metFORMIN (GLUCOPHAGE) 500 MG tablet Take 1,000 mg by mouth 2 (two) times daily with a meal.     . Multiple Vitamin (MULTIVITAMIN WITH MINERALS) TABS tablet Take 1 tablet by mouth daily.    . nitroGLYCERIN (NITROSTAT) 0.4 MG SL tablet Place 0.4 mg under the tongue every 5 (five) minutes as needed for chest pain. Reported on 07/08/2015    . omeprazole (PRILOSEC) 20 MG capsule Take 20 mg by mouth 2 (two) times daily.      . ondansetron (ZOFRAN) 4 MG tablet Take 4 mg by mouth every 6 (six) hours as needed. For nausea    . POLY-IRON 150 150 MG capsule TAKE 1 CAPSULE BY MOUTH 2 TIMES DAILY. 60 capsule 5  . potassium chloride (K-DUR) 10 MEQ tablet Take 2 tablets by mouth 2 (two) times daily.    . vitamin B-12  (CYANOCOBALAMIN) 500 MCG tablet Take 500 mcg by mouth daily.     No current facility-administered medications for this visit.    Allergies:   Ibuprofen; Codeine; Celexa; and Reglan    Social History:  The patient  reports that she quit smoking about 32 years ago. Her smoking use included Cigarettes. She started smoking about 49 years ago. She has a 3.75 pack-year smoking history. She has never used smokeless tobacco. She reports that she does not drink alcohol or use illicit drugs.   Family History:  The patient's family history includes Alzheimer's disease in her mother; Breast cancer in her sister; Heart attack in her mother; Hypertension in her mother and other; Stroke in her mother. There is no history of Heart disease or Colon cancer.    ROS: All other systems are reviewed and negative. Unless otherwise mentioned in H&P    PHYSICAL EXAM: VS:  BP 112/64 mmHg  Pulse 87  Ht 5\' 6"  (1.676 m)  Wt 209 lb (94.802 kg)  BMI 33.75 kg/m2  SpO2 91% , BMI Body mass index is 33.75 kg/(m^2). GEN: Well nourished, well developed, in no acute distress HEENT: normal Neck: no JVD, carotid bruits, or masses Cardiac: RRR; no murmurs, rubs, or gallops,no edema  Respiratory:  Clear to auscultation bilaterally, normal work of breathing GI: soft, nontender, nondistended, + BS MS: no deformity or atrophyuses a cane for ambulation and is frail. Skin: warm and dry, no rash Neuro:  Strength and sensation are intact Psych: euthymic mood, full affect  Recent Labs: 01/29/2015: B Natriuretic Peptide 265.7* 02/13/2015: ALT 7; Magnesium 1.1*; TSH 3.854 03/20/2015: BUN 18; Creatinine, Ser 0.85; Potassium 5.0; Sodium 137 04/21/2015: Pro B Natriuretic peptide (BNP) 470.0* 06/18/2015: Hemoglobin 13.6; Platelets 227    Lipid Panel    Component Value Date/Time   CHOL 77 09/02/2011 0446   TRIG 88 09/02/2011 0446   HDL 35* 09/02/2011 0446   CHOLHDL 2.2 09/02/2011 0446  VLDL 18 09/02/2011 0446   LDLCALC 24  09/02/2011 0446      Wt Readings from Last 3 Encounters:  08/17/15 209 lb (94.802 kg)  07/22/15 202 lb (91.627 kg)  07/08/15 208 lb 4.8 oz (94.484 kg)      ASSESSMENT AND PLAN:  1.  Paroxysmal Atrial fibrillation:she is an irregular rhythm on assessment today.  We will continue amiodarone, Eliquis with CHADS VASC Score of 4. She was seen recently by her primary care physician, with labs drawn.  She states that she was within normal limits and he made no changes.  She is also being followed by oncology for her iron deficiency anemia.  I will not make any changes in her medication regimen at this time.    2. Diastolic CHF: she does not have any overt evidence of fluid overload, there is no significant edema.  She does have some mild edema in the right leg, where she has broken her tibia.  But she states it has been a little swollen since the fracture.  I will not make any changes were ordered.  The labs as they have recently been drawn by her primary care physician.  I believe some of the weight gain is been related to her sedentary nature after having a fall and not related to fluid retention.  3. Anemia: She is being followed by hematology for ongoing management and labs.   Current medicines are reviewed at length with the patient today.     No orders of the defined types were placed in this encounter.     Disposition:   FU with 6 months    Signed, Joni Reining, NP  08/17/2015 3:31 PM    [facility] [address], [place], [place] [address] Phone: [phone]; Fax: [phone]

## 2015-08-28 DIAGNOSIS — G259 Extrapyramidal and movement disorder, unspecified: Secondary | ICD-10-CM | POA: Diagnosis not present

## 2015-08-28 DIAGNOSIS — M797 Fibromyalgia: Secondary | ICD-10-CM | POA: Diagnosis not present

## 2015-08-28 DIAGNOSIS — M6281 Muscle weakness (generalized): Secondary | ICD-10-CM | POA: Diagnosis not present

## 2015-08-28 DIAGNOSIS — I10 Essential (primary) hypertension: Secondary | ICD-10-CM | POA: Diagnosis not present

## 2015-08-28 DIAGNOSIS — E1140 Type 2 diabetes mellitus with diabetic neuropathy, unspecified: Secondary | ICD-10-CM | POA: Diagnosis not present

## 2015-08-28 DIAGNOSIS — F09 Unspecified mental disorder due to known physiological condition: Secondary | ICD-10-CM | POA: Diagnosis not present

## 2015-08-28 DIAGNOSIS — G40309 Generalized idiopathic epilepsy and epileptic syndromes, not intractable, without status epilepticus: Secondary | ICD-10-CM | POA: Diagnosis not present

## 2015-08-28 DIAGNOSIS — Z79899 Other long term (current) drug therapy: Secondary | ICD-10-CM | POA: Diagnosis not present

## 2015-09-03 ENCOUNTER — Other Ambulatory Visit: Payer: Self-pay | Admitting: Cardiology

## 2015-09-03 NOTE — Telephone Encounter (Signed)
Pt has to reapply for program,faxed forms to her daughter today

## 2015-09-03 NOTE — Telephone Encounter (Signed)
She needs her Eliquis reordered from [employer]. / tg

## 2015-09-16 ENCOUNTER — Other Ambulatory Visit (HOSPITAL_COMMUNITY): Payer: Medicare Other

## 2015-09-25 ENCOUNTER — Other Ambulatory Visit (HOSPITAL_COMMUNITY): Payer: Medicare Other

## 2015-09-25 ENCOUNTER — Encounter (HOSPITAL_COMMUNITY): Payer: Medicare Other | Attending: Oncology

## 2015-09-25 DIAGNOSIS — D509 Iron deficiency anemia, unspecified: Secondary | ICD-10-CM

## 2015-09-25 LAB — CBC
HCT: 37.1 % (ref 36.0–46.0)
Hemoglobin: 12.2 g/dL (ref 12.0–15.0)
MCH: 32.9 pg (ref 26.0–34.0)
MCHC: 32.9 g/dL (ref 30.0–36.0)
MCV: 100 fL (ref 78.0–100.0)
PLATELETS: 192 10*3/uL (ref 150–400)
RBC: 3.71 MIL/uL — ABNORMAL LOW (ref 3.87–5.11)
RDW: 13.9 % (ref 11.5–15.5)
WBC: 8.1 10*3/uL (ref 4.0–10.5)

## 2015-09-26 LAB — FERRITIN: FERRITIN: 68 ng/mL (ref 11–307)

## 2015-10-09 DIAGNOSIS — K210 Gastro-esophageal reflux disease with esophagitis: Secondary | ICD-10-CM | POA: Diagnosis not present

## 2015-10-09 DIAGNOSIS — I482 Chronic atrial fibrillation: Secondary | ICD-10-CM | POA: Diagnosis not present

## 2015-10-09 DIAGNOSIS — E1142 Type 2 diabetes mellitus with diabetic polyneuropathy: Secondary | ICD-10-CM | POA: Diagnosis not present

## 2015-10-09 DIAGNOSIS — M25561 Pain in right knee: Secondary | ICD-10-CM | POA: Diagnosis not present

## 2015-10-09 DIAGNOSIS — B351 Tinea unguium: Secondary | ICD-10-CM | POA: Diagnosis not present

## 2015-10-09 DIAGNOSIS — G2401 Drug induced subacute dyskinesia: Secondary | ICD-10-CM | POA: Diagnosis not present

## 2015-10-09 DIAGNOSIS — L851 Acquired keratosis [keratoderma] palmaris et plantaris: Secondary | ICD-10-CM | POA: Diagnosis not present

## 2015-10-17 ENCOUNTER — Other Ambulatory Visit: Payer: Self-pay | Admitting: Cardiology

## 2015-10-20 ENCOUNTER — Other Ambulatory Visit (HOSPITAL_COMMUNITY): Payer: Self-pay | Admitting: *Deleted

## 2015-10-20 MED ORDER — POTASSIUM CHLORIDE ER 10 MEQ PO TBCR: 20.0000 meq | EXTENDED_RELEASE_TABLET | Freq: Two times a day (BID) | ORAL | Status: DC

## 2015-10-30 DIAGNOSIS — M19072 Primary osteoarthritis, left ankle and foot: Secondary | ICD-10-CM | POA: Diagnosis not present

## 2015-10-30 DIAGNOSIS — E1142 Type 2 diabetes mellitus with diabetic polyneuropathy: Secondary | ICD-10-CM | POA: Diagnosis not present

## 2015-10-31 IMAGING — CR DG CHEST 2V
2 series · 2 of 2 positions shown · non-contrast
Comparison: For 07/23/2012.

CLINICAL DATA: Cough, shortness of breath.

EXAM:
CHEST  2 VIEW

[view not recorded (1 of 2)]
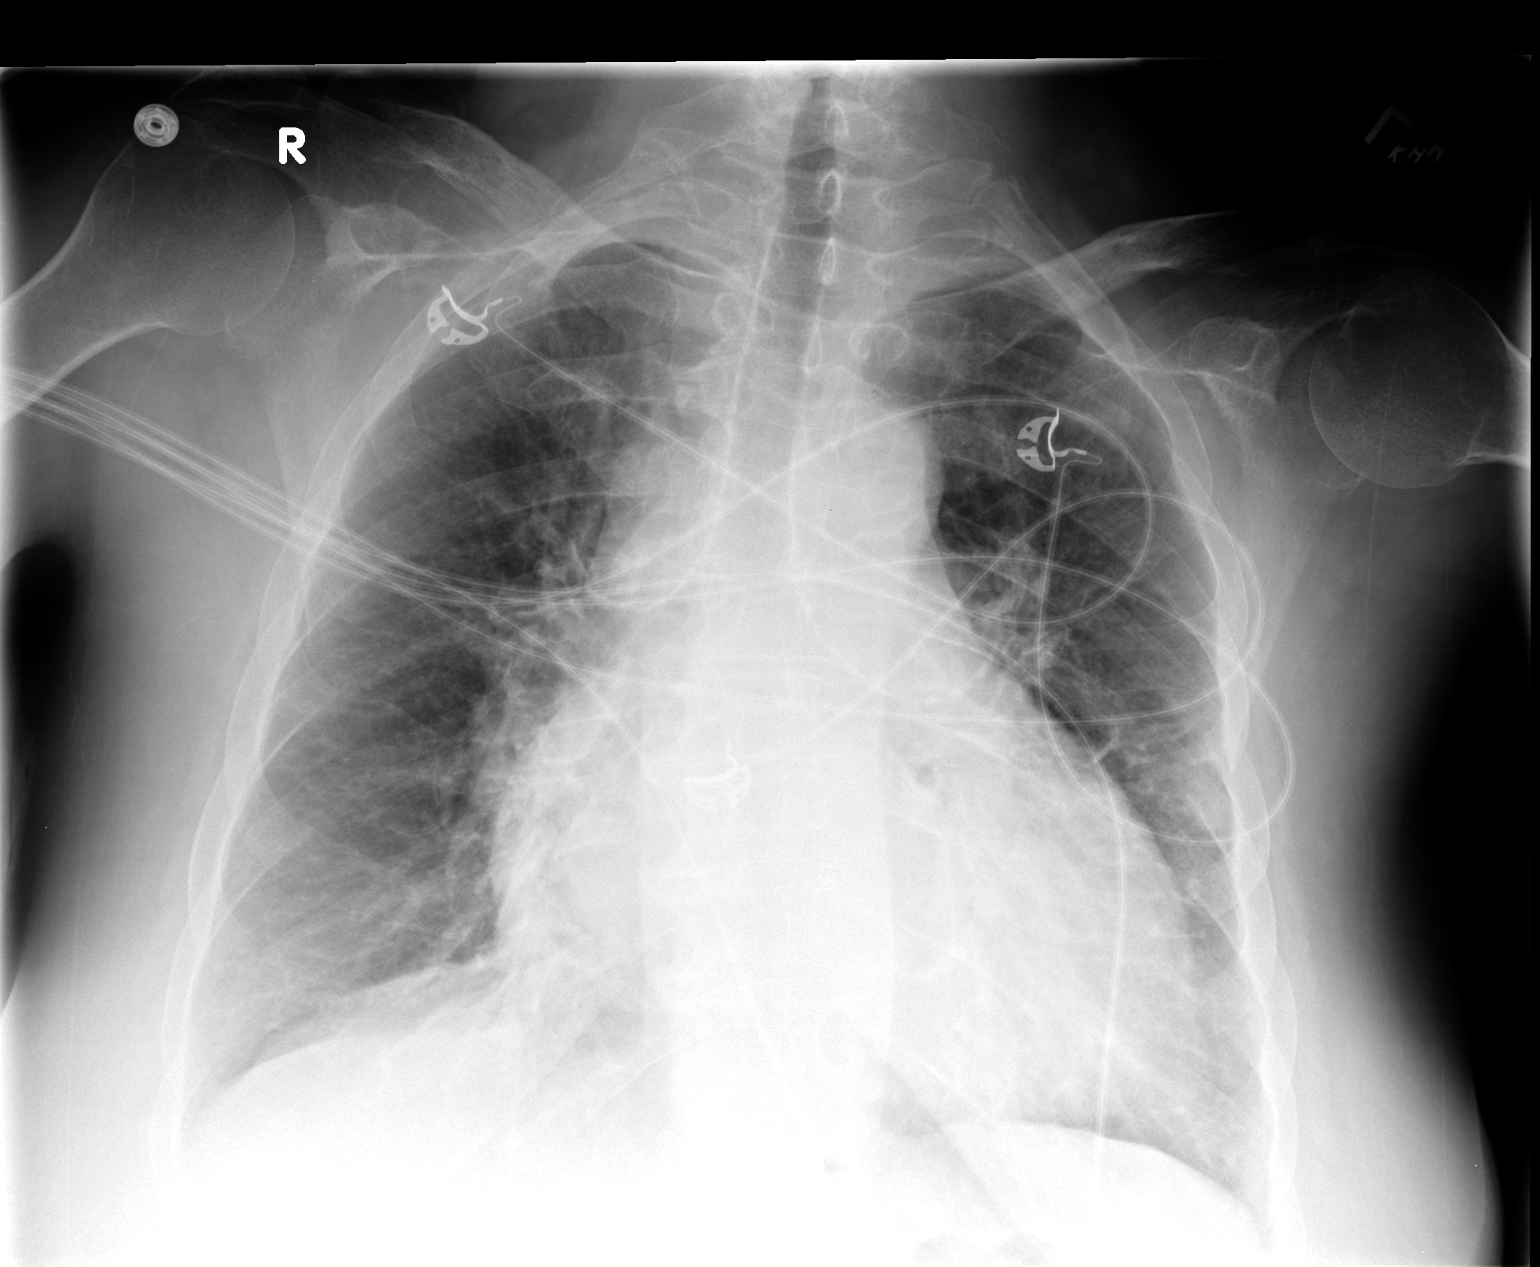

[view not recorded (2 of 2)]
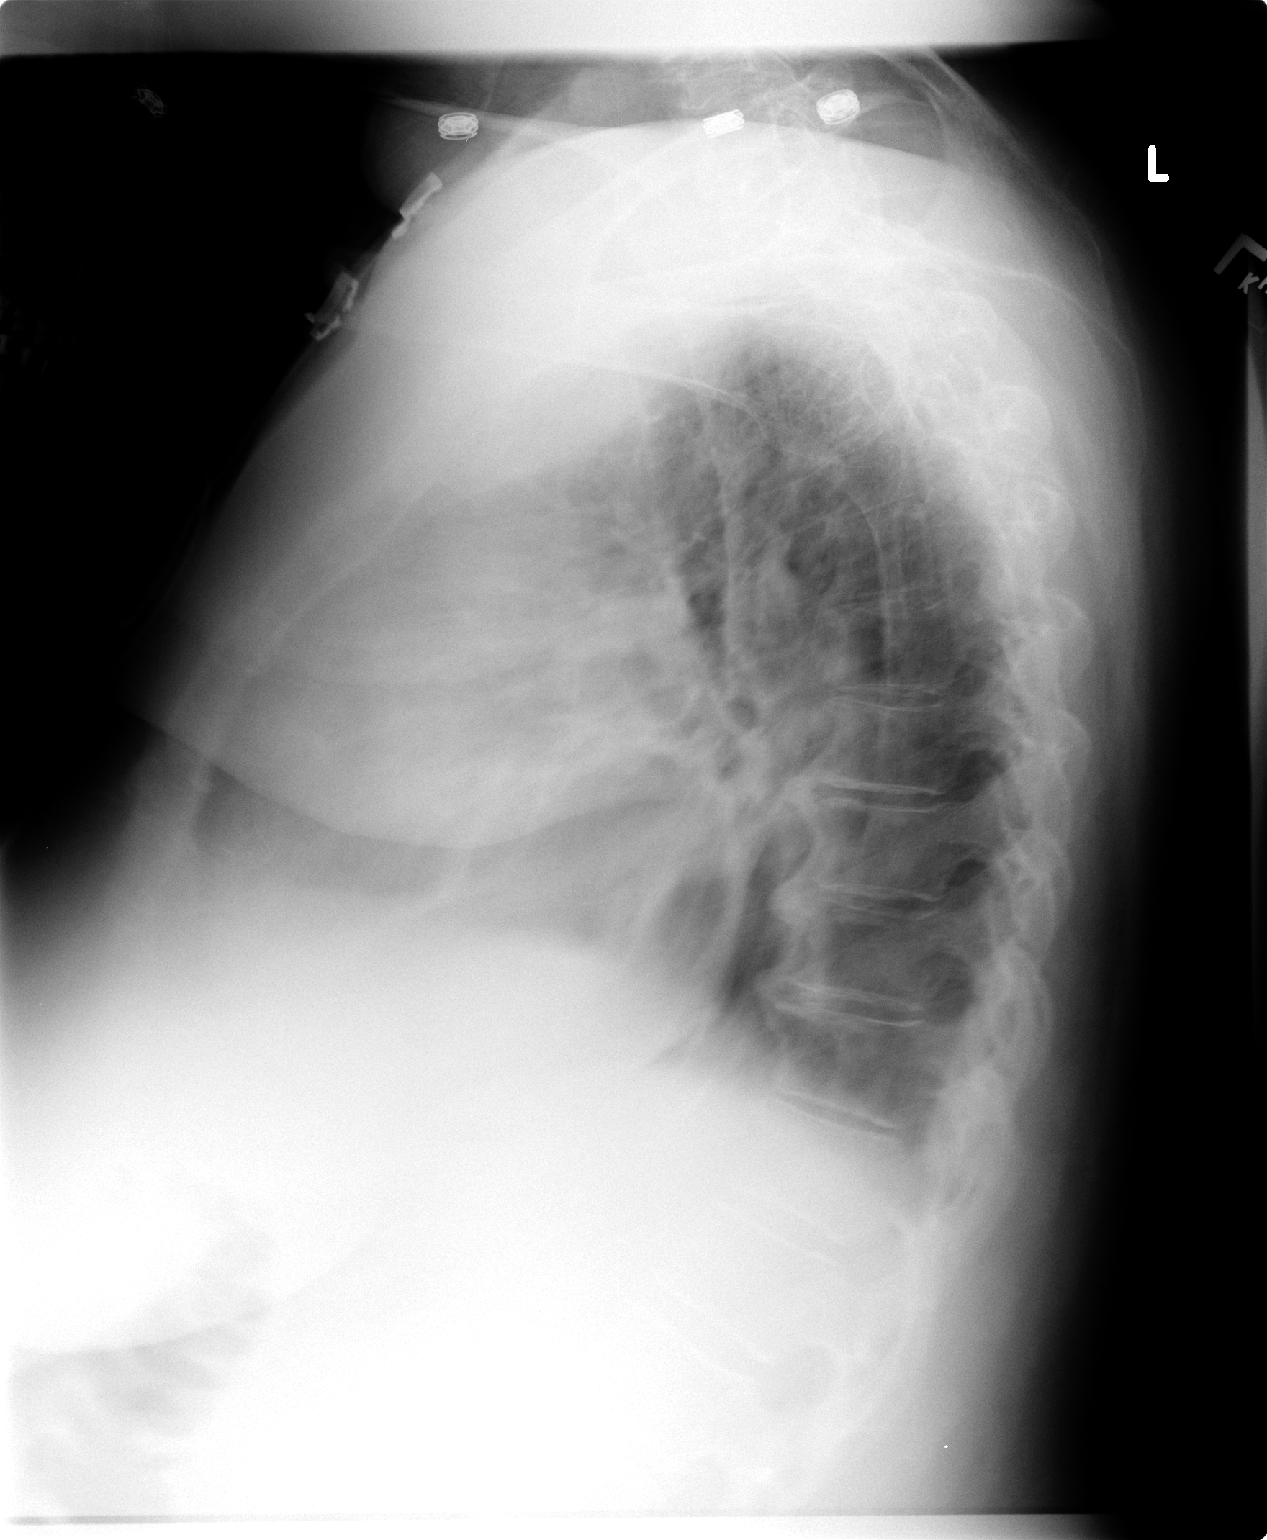

[2 of 2 positions shown; findings below may reference images not displayed]

FINDINGS: The cardiac silhouette is enlarged. No focal regions of
consolidation. No focal infiltrates are appreciated. The lateral
view is degraded by motion or possibly grid artifact. There is
flattening of the hemidiaphragms. With thickening taking into
consideration no focal regions of consolidation or focal infiltrates
appreciated. The osseous structures are grossly unremarkable.
IMPRESSION: Study limited by artifact on lateral view. Cardiomegaly. With
technique taking into consideration there is no evidence of focal
infiltrates no focal regions of consolidation. COPD. Repeat of the
lateral view is recommended.

## 2015-11-14 IMAGING — CT CT ANGIO CHEST
1 of 11 series · 3 of 37 positions shown · IV contrast (Omnipaque 300)
Comparison: Chest radiograph performed earlier today at [DATE] p.m.,
and CTA of the chest performed 10/16/2006

CLINICAL DATA: Shortness of breath, weakness and chest pain.
History of smoking.

EXAM:
CT ANGIOGRAPHY CHEST WITH CONTRAST
TECHNIQUE: Multidetector CT imaging of the chest was performed using the
standard protocol during bolus administration of intravenous
contrast. Multiplanar CT image reconstructions including MIPs were
obtained to evaluate the vascular anatomy.
CONTRAST:  100mL OMNIPAQUE IOHEXOL 350 MG/ML SOLN

[Series 17: pe 3.0 b40f · axial · 0.76mm/px · z∈[-239,-107]mm · 3 of 88 slices shown]
[im 22/88  lung]
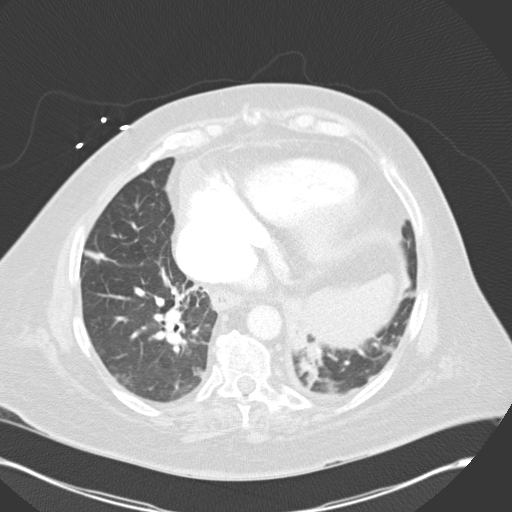
[im 44/88  mediastinal]
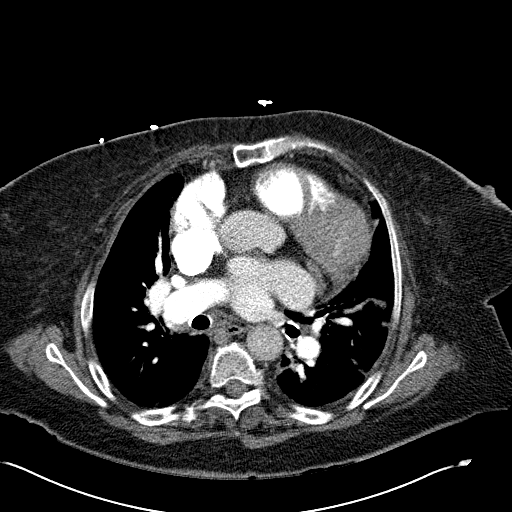
[im 66/88  lung]
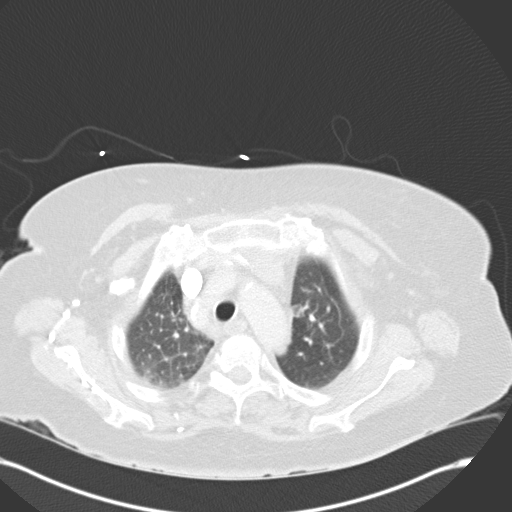

[3 of 37 positions shown; findings below may reference images not displayed]

FINDINGS: There is no evidence of pulmonary embolus.

Patchy airspace opacity is noted within the left lung, and minimal
right-sided airspace opacity is seen, with underlying interstitial
prominence. The appearance is more suggestive of atelectasis, though
mild pneumonia might have such an appearance. There is no evidence
of pleural effusion or pneumothorax. No masses are identified; no
abnormal focal contrast enhancement is seen.

There is bilateral prominence of the pulmonary arteries, with
tapering seen distally, raising concern for mild pulmonary arterial
hypertension. Mild biatrial enlargement is noted. Calcification is
seen at the mitral valve. The mediastinum is otherwise unremarkable
in appearance. No mediastinal lymphadenopathy is seen. The great
vessel grossly unremarkable. No pericardial effusion is identified.
No axillary lymphadenopathy is seen. The thyroid gland is not
visualized.

There is reflux of contrast into the IVC and hepatic veins. The
visualized portions of the spleen are unremarkable. There is mild
diffuse fatty infiltration involving the pancreas.

No acute osseous abnormalities are seen.

Review of the MIP images confirms the above findings.
IMPRESSION: 1. No evidence of pulmonary embolus.
2. Patchy airspace opacity within the left lung, and minimal
right-sided airspace opacity, with underlying interstitial
prominence. The appearance is more suggestive of atelectasis, though
mild pneumonia might have such an appearance.
3. Prominence of the pulmonary arteries, with apparent distal
tapering, raising concern for mild pulmonary arterial hypertension.
Mild biatrial enlargement noted. Calcification seen at the mitral
valve.
4. Reflux of contrast incidentally noted into the IVC and hepatic
veins.

## 2015-11-14 IMAGING — CR DG CHEST 1V PORT
1 series · 1 of 1 positions shown · non-contrast
Comparison: 06/15/2013

CLINICAL DATA: Feels like heart is ray seen.

EXAM:
PORTABLE CHEST - 1 VIEW

[portable]
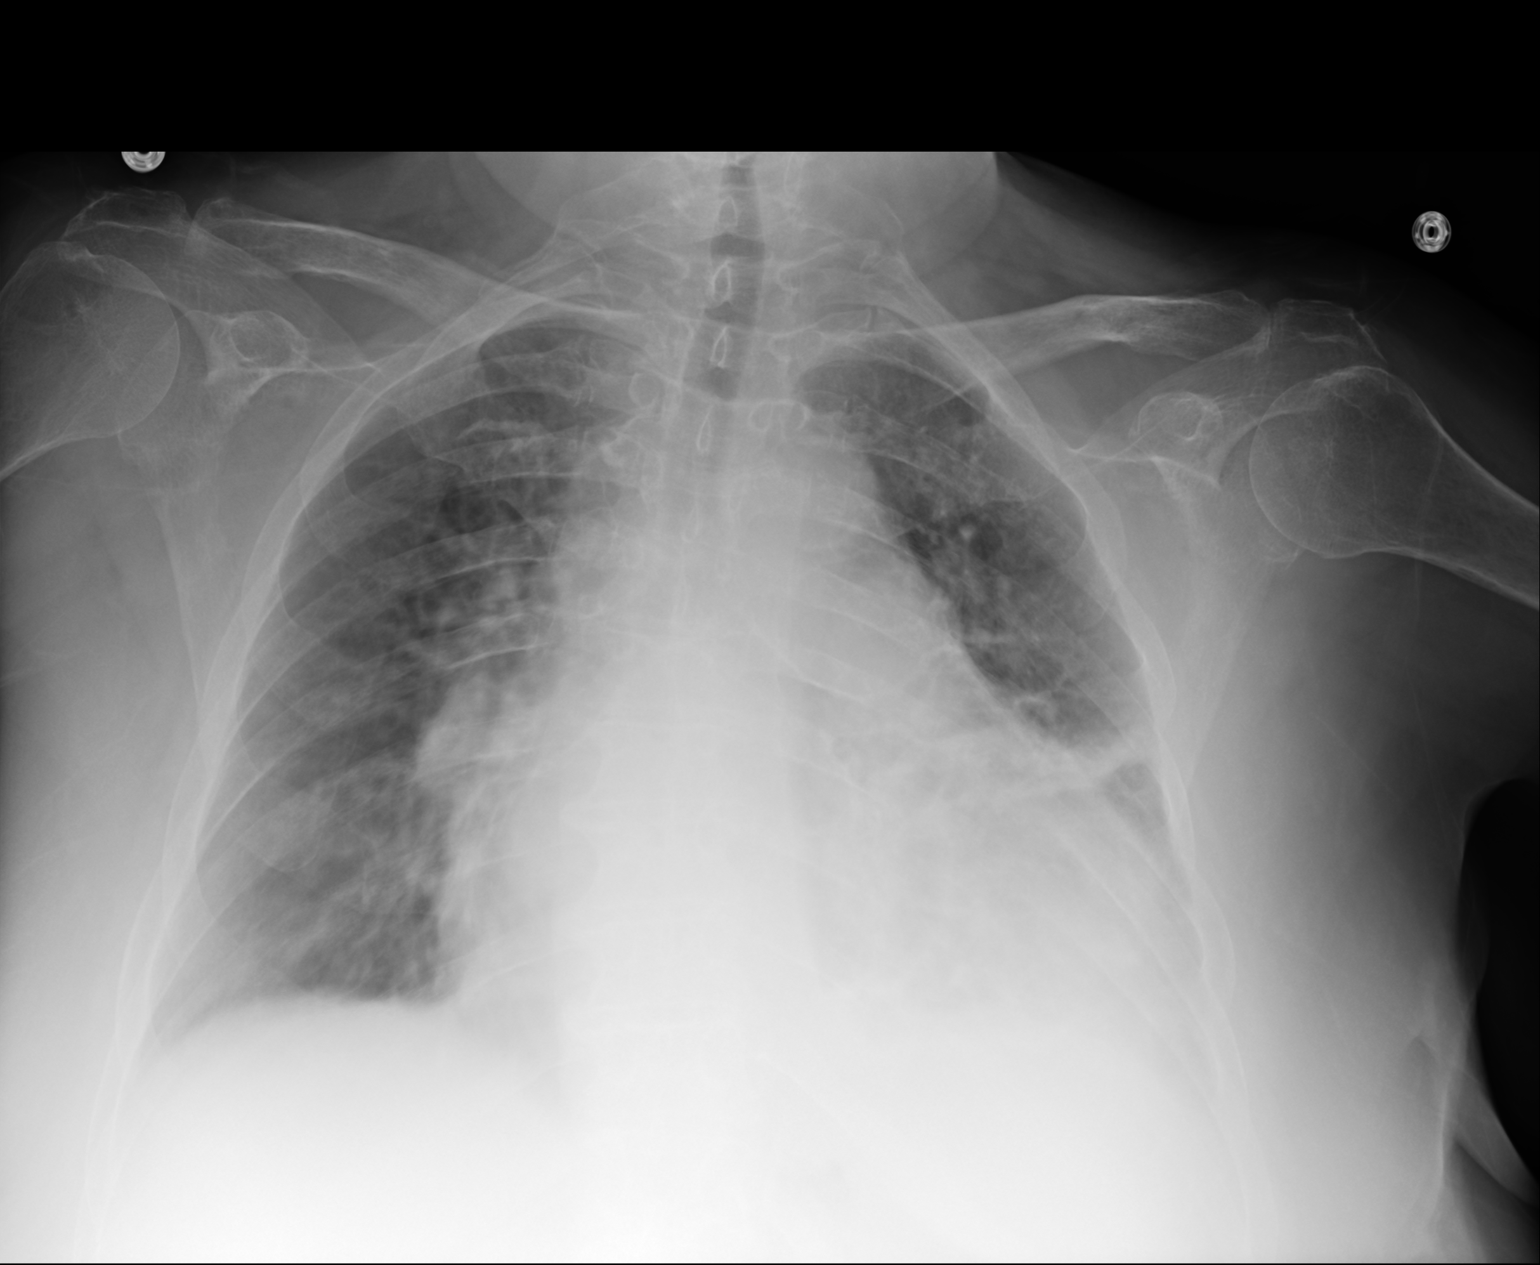

[1 of 1 positions shown; findings below may reference images not displayed]

FINDINGS: There is moderate enlargement of the cardiopericardial silhouette.
The mediastinum is normal in contour. No convincing hilar masses.

There is opacity that extends laterally from the left hilum, likely
atelectasis. Infiltrate is not excluded.

Prominent bronchovascular markings are noted bilaterally which are
stable. No pulmonary edema. No pleural effusion or pneumothorax is
seen.

The bony thorax is demineralized but grossly intact.
IMPRESSION: No convincing acute cardiopulmonary disease. Left mid lung opacity
is likely atelectasis. Moderate cardiomegaly.

## 2015-11-25 DIAGNOSIS — M797 Fibromyalgia: Secondary | ICD-10-CM | POA: Diagnosis not present

## 2015-11-25 DIAGNOSIS — G40309 Generalized idiopathic epilepsy and epileptic syndromes, not intractable, without status epilepticus: Secondary | ICD-10-CM | POA: Diagnosis not present

## 2015-11-25 DIAGNOSIS — I4891 Unspecified atrial fibrillation: Secondary | ICD-10-CM | POA: Diagnosis not present

## 2015-11-25 DIAGNOSIS — Z79899 Other long term (current) drug therapy: Secondary | ICD-10-CM | POA: Diagnosis not present

## 2015-11-25 DIAGNOSIS — G259 Extrapyramidal and movement disorder, unspecified: Secondary | ICD-10-CM | POA: Diagnosis not present

## 2015-11-25 DIAGNOSIS — E538 Deficiency of other specified B group vitamins: Secondary | ICD-10-CM | POA: Diagnosis not present

## 2015-11-25 DIAGNOSIS — I10 Essential (primary) hypertension: Secondary | ICD-10-CM | POA: Diagnosis not present

## 2015-11-25 DIAGNOSIS — M6281 Muscle weakness (generalized): Secondary | ICD-10-CM | POA: Diagnosis not present

## 2015-11-26 IMAGING — CR DG SHOULDER 2+V*L*
3 series · 3 of 3 positions shown · non-contrast
Comparison: August 24, 2009

CLINICAL DATA: Pain

EXAM:
LEFT SHOULDER - 2+ VIEW

[view not recorded (1 of 3)]
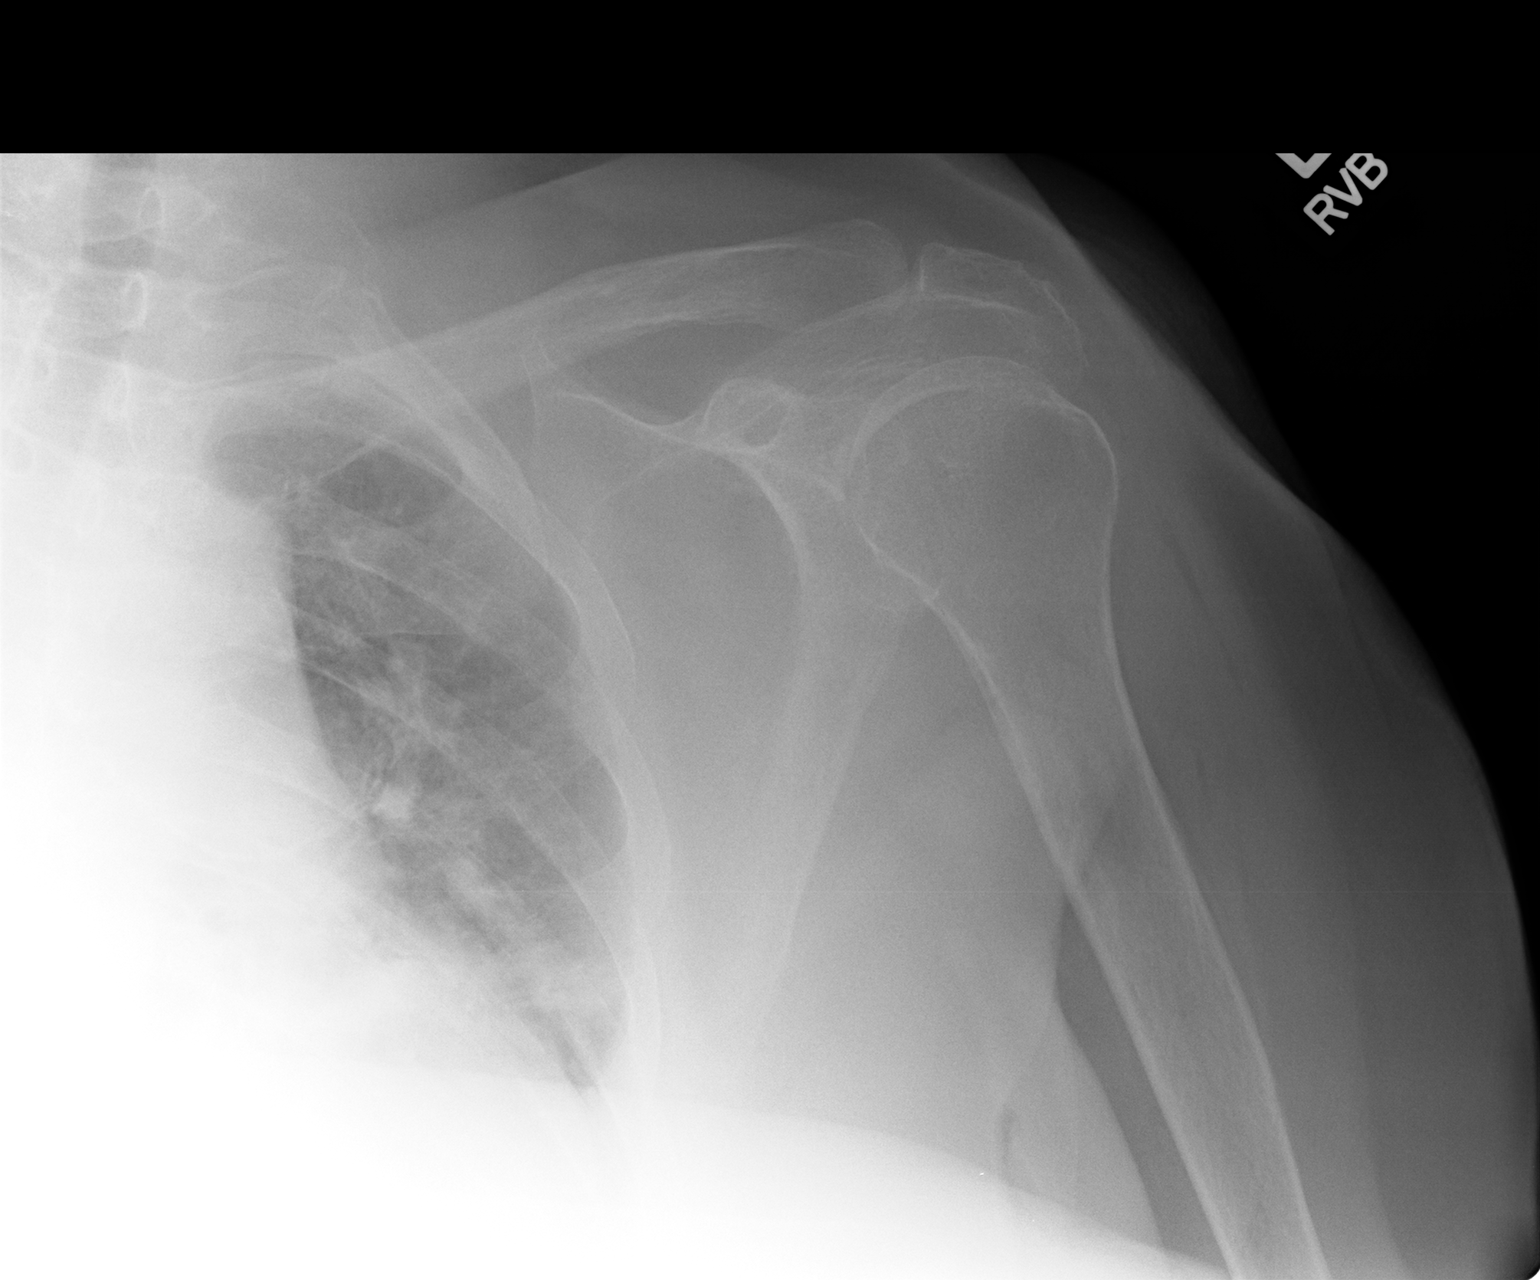

[view not recorded (2 of 3)]
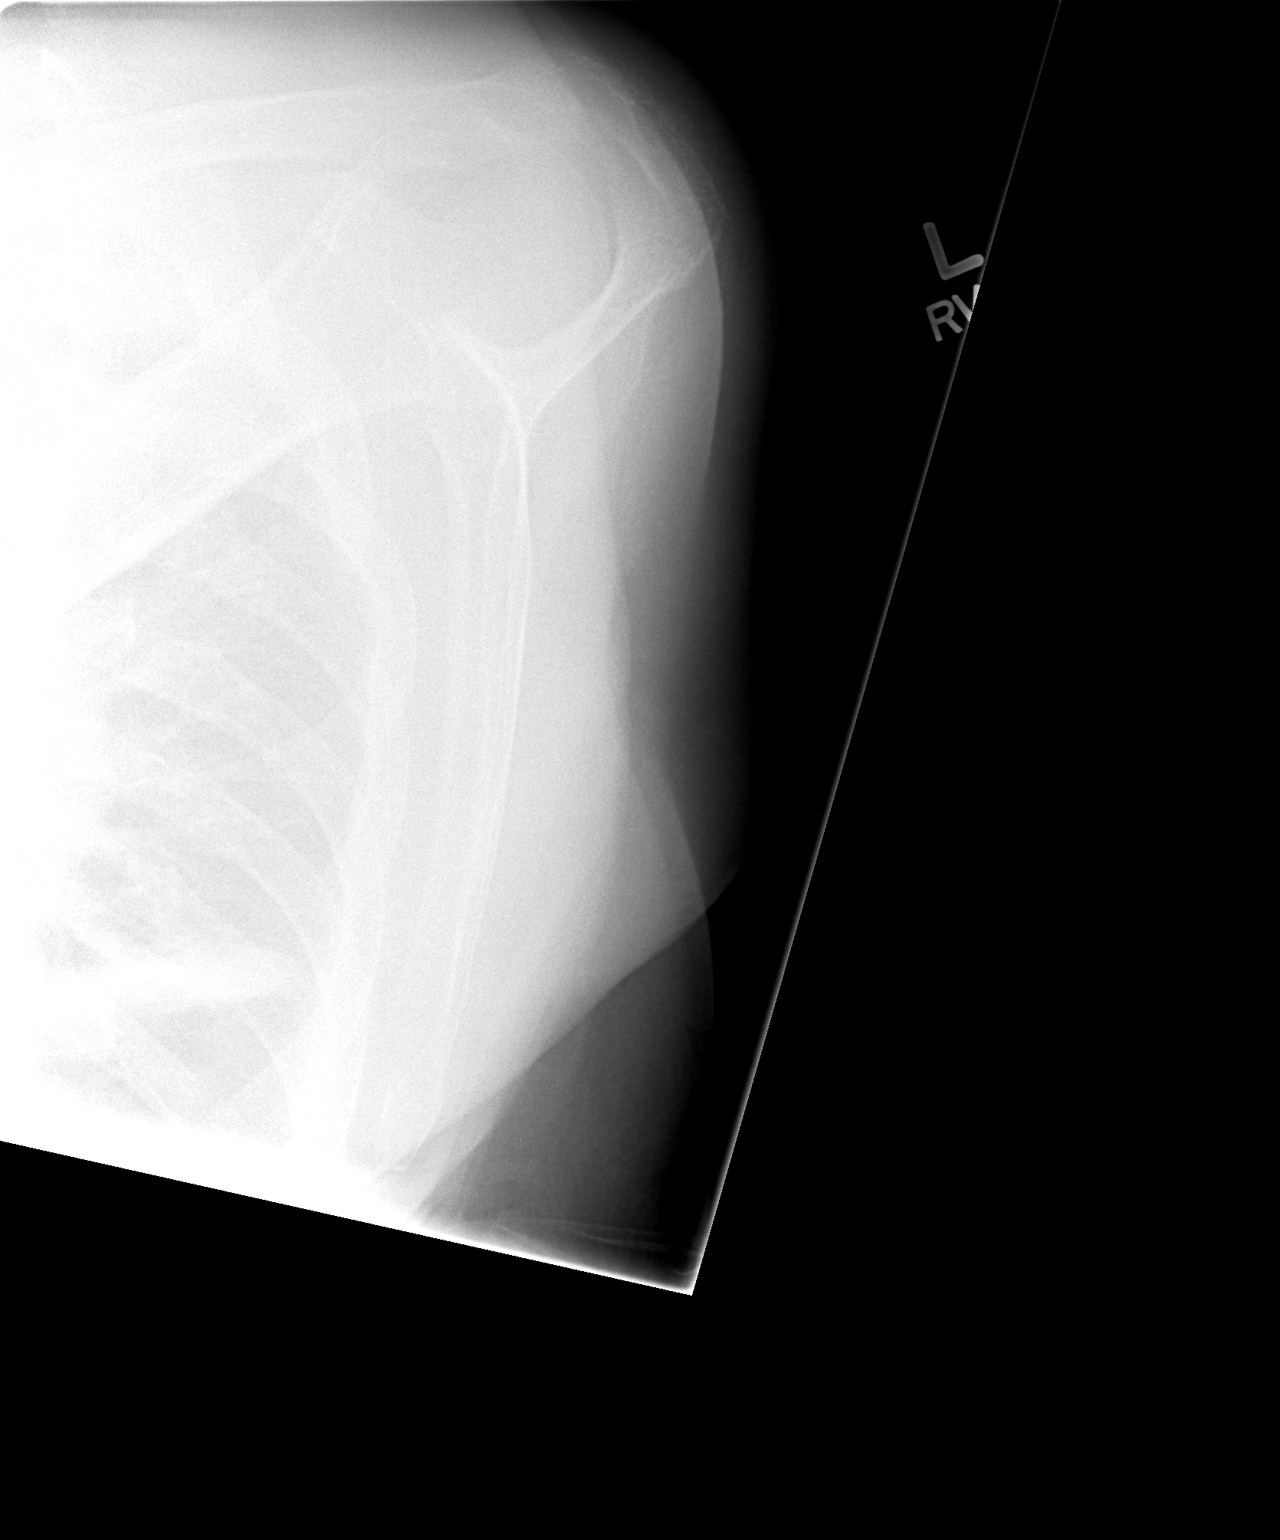

[view not recorded (3 of 3)]
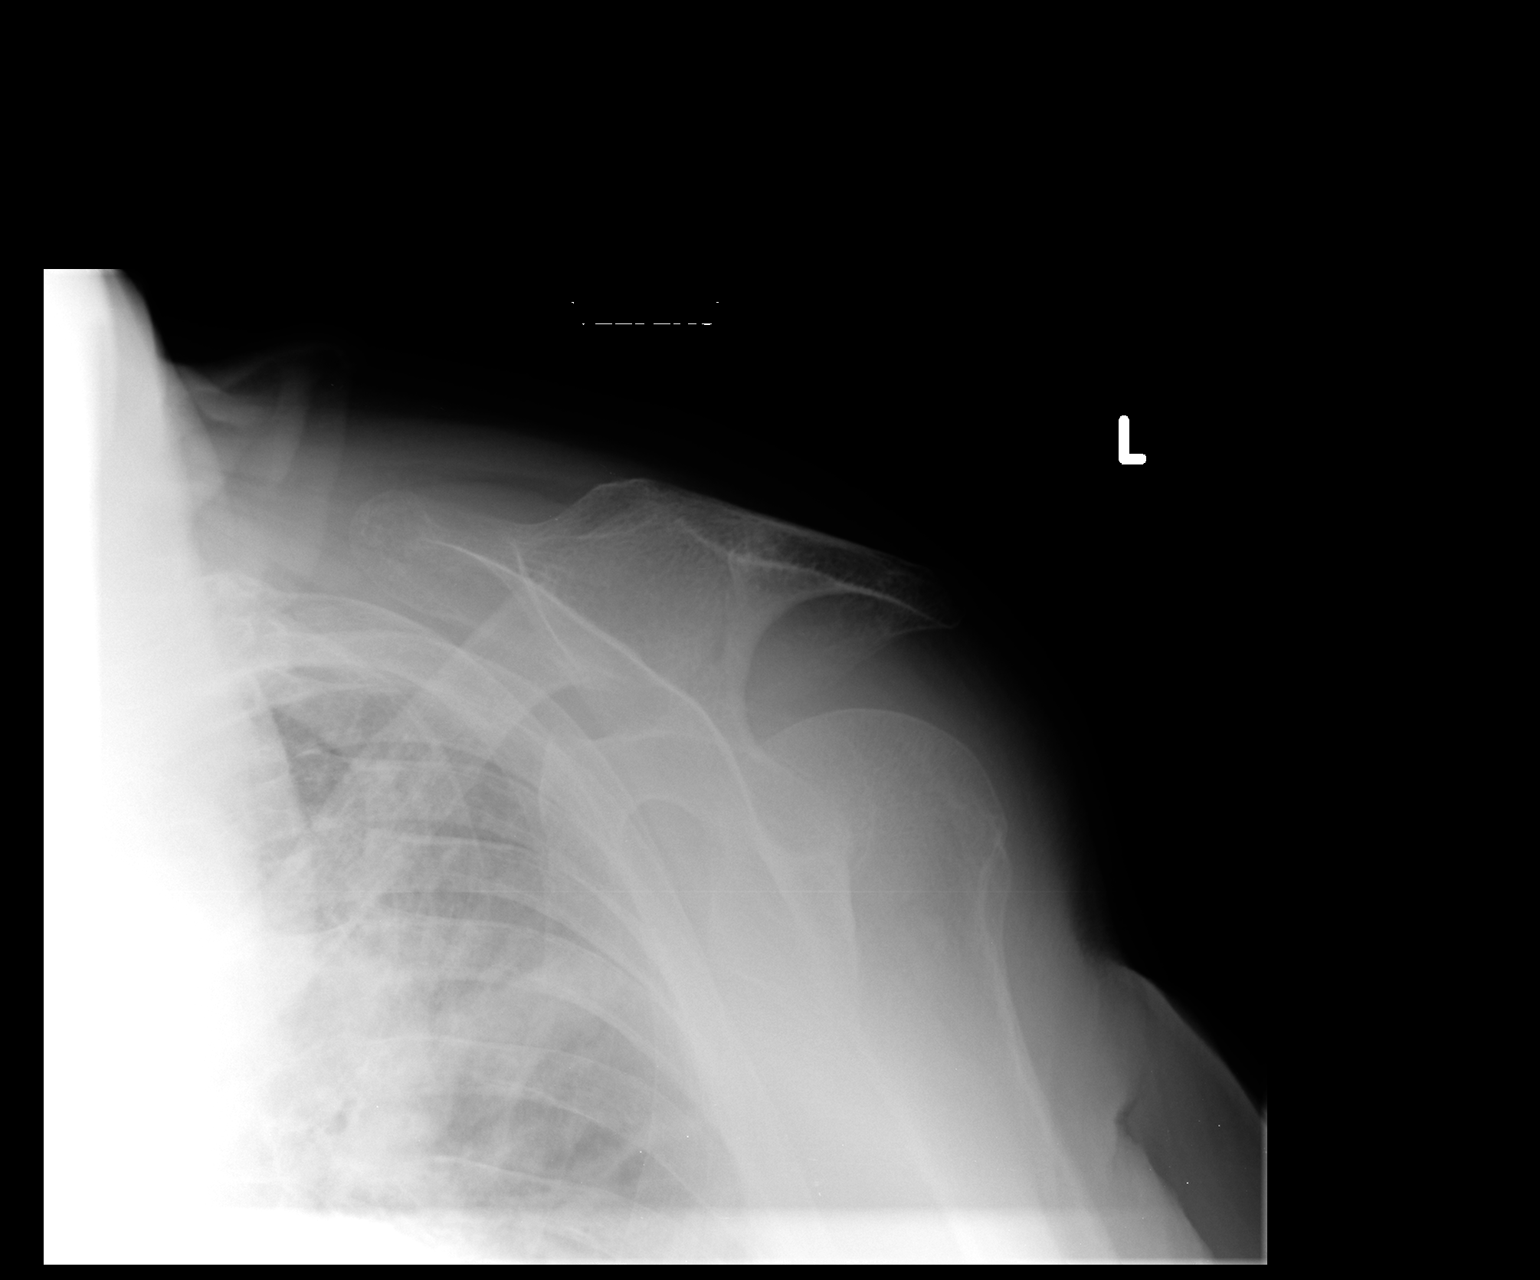

[3 of 3 positions shown; findings below may reference images not displayed]

FINDINGS: Frontal, Y scapular, and tilt oblique images were obtained. There is
no fracture or dislocation. There is mild narrowing of the
glenohumeral joint. No erosive change or intra-articular
calcification.

There is patchy infiltrate in the left lower lobe region.
IMPRESSION: Mild osteoarthritic change. No fracture or dislocation. Incidental
note is made of patchy infiltrate in the left lower lobe.

## 2015-12-07 ENCOUNTER — Encounter: Payer: Self-pay | Admitting: Gastroenterology

## 2015-12-07 ENCOUNTER — Ambulatory Visit (INDEPENDENT_AMBULATORY_CARE_PROVIDER_SITE_OTHER): Payer: Medicare Other | Admitting: Gastroenterology

## 2015-12-07 VITALS — BP 165/80 | HR 61 | Temp 96.6°F | Ht 66.0 in | Wt 231.2 lb

## 2015-12-07 DIAGNOSIS — K580 Irritable bowel syndrome with diarrhea: Secondary | ICD-10-CM

## 2015-12-07 DIAGNOSIS — K3184 Gastroparesis: Secondary | ICD-10-CM | POA: Diagnosis not present

## 2015-12-07 DIAGNOSIS — R1310 Dysphagia, unspecified: Secondary | ICD-10-CM

## 2015-12-07 MED ORDER — ONDANSETRON HCL 4 MG PO TABS
4.0000 mg | ORAL_TABLET | Freq: Three times a day (TID) | ORAL | Status: DC
Start: 2015-12-07 — End: 2016-02-05

## 2015-12-07 MED ORDER — DICYCLOMINE HCL 10 MG PO CAPS: ORAL_CAPSULE | ORAL | Status: DC

## 2015-12-07 MED ORDER — PANTOPRAZOLE SODIUM 40 MG PO TBEC: 40.0000 mg | DELAYED_RELEASE_TABLET | Freq: Two times a day (BID) | ORAL | Status: DC

## 2015-12-07 NOTE — Progress Notes (Signed)
Referring Provider: Gareth Morgan, MD Primary Care Physician:  [name], MD Primary GI: Dr. Darrick Penna     Chief Complaint  Patient presents with  . Follow-up    HPI:   [name] is a 65 y.o. female presenting today with a history of chronic abdominal pain, gastroparesis, IBS, history of IDA. EGD in May 2016 due to anemia and history of Barrett's without evidence of Barrett's. Last colonoscopy in May 2015.    Has been dealing with intense pain in lower abdomen for the past week. Felt distended. When she eats something, she wants "more". Taking Marinol after meals. Marinol is expensive. Taking Zofran once a day, which has been helping significantly. No vomiting. Has had some mild nausea in setting of reflux exacerbations. Omeprazole BID. Having loose stool about 6-7 times per day in setting of known history of chronic diarrhea. No recent antibiotics. Choking again with solid foods.   Past Medical History  Diagnosis Date  . Chest pain     Negative cardiac catheterization in 2002; negative stress nuclear study in 2008  . Chronic diastolic CHF (congestive heart failure) (HCC)     a. EF predominantly normal during prior echoes but was 40% during 10/2014 echo. b. Most recent 01/2015 EF normal, 55-60%.  . Paroxysmal atrial fibrillation (HCC)   . Chronic anticoagulation   . Diabetes mellitus, type 2 (HCC)     Insulin therapy; exacerbated by prednisone  . Syncope     a. Admitted 05/2009; magnetic resonance imagin/ MRA - negative; etiology thought to be orthostasis secondary to drugs and dehydration. b. Syncope 02/2015 also felt 2/2 dehydration.  Marland Kitchen GERD (gastroesophageal reflux disease)   . IBS (irritable bowel syndrome)   . Hiatal hernia   . Gastroparesis     99% retention 05/2008 on GES  . Hyperlipidemia   . Hypertension   . Hypothyroid   . Chronic LBP     Surgical intervention in 1996  . Obesity   . OSA on CPAP   . Anemia     H/H of 10/30 with a normal MCV in 12/09  .  Barrett's esophagus     Diagnosed 1995. Last EGD 2016-NO BARRETT'S.   . Seizures (HCC)   . Pulmonary hypertension (HCC) 01/2015    a. Predominantly pulmonary venous hypertension but may be component of PAH.    Past Surgical History  Procedure Laterality Date  . Cardiovascular stress test  2008    Stress nuclear study  . Back surgery  1996  . Laparoscopic cholecystectomy  1990s  . Total abdominal hysterectomy  1999  . Laminectomy  1995    L4-L5  . Carpal tunnel release  1994  . Esophagogastroduodenoscopy  2008    Barrett's without dysplasia. esphagus dilated. antral erosions, h.pylori serologies negative.  . Colonoscopy  11/2011    Dr. Darrick Penna: Internal hemorrhoids, mild diverticulosis. Random colon biopsies negative.  . Givens capsule study  12/07/2011    Proximal small bowel, rare AVM. Distal small bowel, multiple ulcers noted  . Esophagogastroduodenoscopy  11/2011    Dr. Darrick Penna: Barrett's esophagus, mild gastritis, diverticulum in the second portion of the duodenum repeat EGD 3 years. Small bowel biopsies negative. Gastric biopsy show reactive gastropathy but no H. pylori. Esophageal biopsies consistent with GERD. Next EGD 11/2014  . Givens capsule study N/A 09/27/2013    Distal small bowel ulcers extending to TI.  Marland Kitchen Givens capsule study N/A 10/10/2013    Procedure: GIVENS CAPSULE STUDY;  Surgeon: West Bali,  MD;  Location: [facility];  Service: Endoscopy;  Laterality: N/A;  7:30  . Colonoscopy N/A 11/08/2013    SLF: Normal mucosa in the terminal ileum/The colon IS redundant/  Moderate diverticulosis throughout the entire colon. ileum bx benign. colon bx benign  . Biopsy N/A 11/08/2013    Procedure: BIOPSY  / Tissue sampling / ulcers present in small intestine;  Surgeon: West Bali, MD;  Location: [facility];  Service: Endoscopy;  Laterality: N/A;  . Cardiac catheterization  2002  . Right heart catheterization N/A 01/10/2014    Procedure: RIGHT HEART CATH;  Surgeon: Lesleigh Noe, MD;  Location: [facility] CATH LAB;  Service: Cardiovascular;  Laterality: N/A;  . Left heart catheterization with coronary angiogram  01/10/2014    Procedure: LEFT HEART CATHETERIZATION WITH CORONARY ANGIOGRAM;  Surgeon: Lesleigh Noe, MD;  Location: [facility] CATH LAB;  Service: Cardiovascular;;  . Esophagogastroduodenoscopy N/A 11/21/2014    MWN:UUVO non-erosive gastritis/irregular z-line  . Cardiac catheterization N/A 01/26/2015    Procedure: Right Heart Cath;  Surgeon: Laurey Morale, MD;  Location: [facility] INVASIVE CV LAB;  Service: Cardiovascular;  Laterality: N/A;  . Cardioversion N/A 03/06/2015    Procedure: CARDIOVERSION;  Surgeon: Laurey Morale, MD;  Location: [facility] ENDOSCOPY;  Service: Cardiovascular;  Laterality: N/A;    Current Outpatient Prescriptions  Medication Sig Dispense Refill  . acetaminophen (TYLENOL) 500 MG tablet Take 500 mg by mouth every 6 (six) hours as needed for moderate pain or headache.    Marland Kitchen amiodarone (PACERONE) 200 MG tablet TAKE ONE TABLET BY MOUTH DAILY. 30 tablet 3  . apixaban (ELIQUIS) 5 MG TABS tablet Take 5 mg by mouth 2 (two) times daily.    Marland Kitchen dicyclomine (BENTYL) 10 MG capsule Take 10 mg by mouth 2 (two) times daily as needed for spasms.    Marland Kitchen dronabinol (MARINOL) 2.5 MG capsule Take 2.5 mg by mouth 2 (two) times daily before lunch and supper.     . DULoxetine (CYMBALTA) 30 MG capsule Take 30 mg by mouth 2 (two) times daily.    . famotidine (PEPCID) 20 MG tablet Take 20 mg by mouth at bedtime.    . furosemide (LASIX) 20 MG tablet Take 20 mg by mouth daily. Take with 40 mg for a total of 60 mg daily    . gabapentin (NEURONTIN) 300 MG capsule Take 300 mg by mouth 3 (three) times daily.    Marland Kitchen glipiZIDE (GLUCOTROL) 10 MG tablet Take 10 mg by mouth 2 (two) times daily before a meal.     . HYDROcodone-acetaminophen (NORCO/VICODIN) 5-325 MG tablet Take 1 tablet by mouth 2 (two) times daily.    . Insulin Glargine (LANTUS SOLOSTAR) 100 UNIT/ML Solostar Pen Inject 20-50 Units  into the skin at bedtime as needed. Patient takes 20 units every night at bedtime if over 178.  Takes 50 units if her blood sugar is above 300.    . levETIRAcetam (KEPPRA) 500 MG tablet Take 500 mg by mouth 2 (two) times daily.    Marland Kitchen levothyroxine (SYNTHROID, LEVOTHROID) 137 MCG tablet Take 137 mcg by mouth daily.    Marland Kitchen loperamide (IMODIUM) 2 MG capsule TAKE (1) CAPSULE THREE TIMES DAILY AS NEEDED FOR DIARRHEA OR LOOSE STOOLS. 30 capsule 3  . LORazepam (ATIVAN) 1 MG tablet Take 1 mg by mouth 3 (three) times daily as needed for anxiety or sleep. For anxiety    . magnesium oxide (MAG-OX) 400 MG tablet Take 400 mg by mouth  daily.     . metaxalone (SKELAXIN) 800 MG tablet Take 800 mg by mouth 3 (three) times daily.     . metFORMIN (GLUCOPHAGE) 500 MG tablet Take 1,000 mg by mouth 2 (two) times daily with a meal.     . Multiple Vitamin (MULTIVITAMIN WITH MINERALS) TABS tablet Take 1 tablet by mouth daily.    . nitroGLYCERIN (NITROSTAT) 0.4 MG SL tablet Place 0.4 mg under the tongue every 5 (five) minutes as needed for chest pain. Reported on 07/08/2015    . omeprazole (PRILOSEC) 20 MG capsule Take 20 mg by mouth 2 (two) times daily.      . ondansetron (ZOFRAN) 4 MG tablet Take 4 mg by mouth every 6 (six) hours as needed. For nausea    . POLY-IRON 150 150 MG capsule TAKE 1 CAPSULE BY MOUTH 2 TIMES DAILY. 60 capsule 5  . potassium chloride (K-DUR) 10 MEQ tablet Take 2 tablets (20 mEq total) by mouth 2 (two) times daily. 120 tablet 3  . vitamin B-12 (CYANOCOBALAMIN) 500 MCG tablet Take 500 mcg by mouth daily.     No current facility-administered medications for this visit.    Allergies as of 12/07/2015 - Review Complete 12/07/2015  Allergen Reaction Noted  . Ibuprofen Other (See Comments) 10/13/2013  . Codeine Nausea And Vomiting and Other (See Comments)   . Celexa [citalopram hydrobromide] Other (See Comments) 06/30/2013  . Reglan [metoclopramide] Other (See Comments) 06/04/2014    Family History    Problem Relation Age of Onset  . Hypertension Mother   . Alzheimer's disease Mother   . Stroke Mother   . Heart attack Mother   . Heart disease Neg Hx   . Hypertension Other   . Colon cancer Neg Hx   . Breast cancer Sister     Social History   Social History  . Marital Status: Married    Spouse Name: N/A  . Number of Children: N/A  . Years of Education: N/A   Occupational History  . 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     Social History Main Topics  . Smoking status: Former Smoker -- 0.25 packs/day for 15 years    Types: Cigarettes    Start date: 02/26/1966    Quit date: 07/01/1983  . Smokeless tobacco: Never Used     Comment: quit in 1984  . Alcohol Use: No     Comment: last etoh one year ago, never frequent  . Drug Use: No  . Sexual Activity: Yes    Birth Control/ Protection: None, Surgical   Other Topics Concern  . None   Social History Narrative   Sedentary   4 children, "blended family"    Review of Systems: As mentioned in HPI   Physical Exam: BP 165/80 mmHg  Pulse 61  Temp(Src) 96.6 F (35.9 C)  Ht 5\' 6"  (1.676 m)  Wt 231 lb 3.2 oz (104.872 kg)  BMI 37.33 kg/m2 General:   Alert and oriented. No distress noted. Pleasant and cooperative.  Head:  Normocephalic and atraumatic. Eyes:  Conjuctiva clear without scleral icterus. Abdomen:  +BS, soft, non-tender and non-distended. No rebound or guarding. No HSM or masses noted. Msk:  Symmetrical without gross deformities. Normal posture. Extremities:  Without edema. Neurologic:  Alert and  oriented x4;  grossly normal neurologically. Psych:  Alert and cooperative. Normal mood and affect.

## 2015-12-07 NOTE — Patient Instructions (Signed)
Stop Marinol and Prilosec. Let's see how you do off Marinol.  Instead: try Protonix twice a day 30 minutes before breakfast and dinner. This is for reflux. For nausea, you may take before meals up to 3 times a day.   I have increased Bentyl to take 1-2 capsules as needed before meals. Make sure you only take this as needed. Monitor for dry mouth, constipation, drowsiness.   We have scheduled you for an xray of your esophagus.  We will see you back in 8 weeks.

## 2015-12-09 NOTE — Progress Notes (Signed)
CC'ED TO PCP 

## 2015-12-09 NOTE — Assessment & Plan Note (Signed)
Will trial off Marinol due to cost. Increase Zofran to before meals as needed. Stop Prilosec, as she notes reflux exacerbations. Start Protonix BID. 8 week follow-up.

## 2015-12-09 NOTE — Assessment & Plan Note (Signed)
Increase Bentyl to 1-2 capsules as needed.

## 2015-12-09 NOTE — Assessment & Plan Note (Signed)
Last EGD in May 2016. Proceed with BPE.

## 2015-12-11 ENCOUNTER — Ambulatory Visit (HOSPITAL_COMMUNITY)
Admission: RE | Admit: 2015-12-11 | Discharge: 2015-12-11 | Disposition: A | Discharge status: Home / Self Care | Payer: Medicare Other | Source: Ambulatory Visit | Attending: Gastroenterology | Admitting: Gastroenterology

## 2015-12-11 DIAGNOSIS — K224 Dyskinesia of esophagus: Secondary | ICD-10-CM | POA: Insufficient documentation

## 2015-12-11 DIAGNOSIS — K219 Gastro-esophageal reflux disease without esophagitis: Secondary | ICD-10-CM | POA: Insufficient documentation

## 2015-12-11 DIAGNOSIS — R1310 Dysphagia, unspecified: Secondary | ICD-10-CM | POA: Insufficient documentation

## 2015-12-11 DIAGNOSIS — K449 Diaphragmatic hernia without obstruction or gangrene: Secondary | ICD-10-CM | POA: Insufficient documentation

## 2015-12-12 ENCOUNTER — Other Ambulatory Visit: Payer: Self-pay | Admitting: Gastroenterology

## 2015-12-16 NOTE — Progress Notes (Signed)
Quick Note:  Non-specific esophageal dysmotility and a small to moderate sized sliding type hiatal hernia and reflux likely source of her symptoms. I recommend following a soft diet, chewing very well, taking small bites, and sitting upright while eating. She could possibly benefit from an outpatient speech evaluation if she is willing. Surgical repair of hiatal hernia is not a good idea as she is not likely a good surgical candidate. Would focus on reflux diet and behavior modification. I will see her in August. ______

## 2015-12-17 ENCOUNTER — Ambulatory Visit (HOSPITAL_COMMUNITY): Payer: Medicare Other | Admitting: Oncology

## 2015-12-17 ENCOUNTER — Other Ambulatory Visit (HOSPITAL_COMMUNITY): Payer: Medicare Other

## 2015-12-18 DIAGNOSIS — L851 Acquired keratosis [keratoderma] palmaris et plantaris: Secondary | ICD-10-CM | POA: Diagnosis not present

## 2015-12-18 DIAGNOSIS — E1142 Type 2 diabetes mellitus with diabetic polyneuropathy: Secondary | ICD-10-CM | POA: Diagnosis not present

## 2015-12-18 DIAGNOSIS — B351 Tinea unguium: Secondary | ICD-10-CM | POA: Diagnosis not present

## 2015-12-18 NOTE — Progress Notes (Signed)
Quick Note:  LMOM to call. ______ 

## 2015-12-21 ENCOUNTER — Telehealth: Payer: Self-pay | Admitting: Gastroenterology

## 2015-12-21 NOTE — Progress Notes (Signed)
Quick Note:  [name], [name] is aware of results and recommendations. She will discuss with her mom and see if she is interested in outpatient speech evaluation. She will call us back in a few days and let us know. ______

## 2015-12-21 NOTE — Telephone Encounter (Signed)
[name] said that someone had called. GF and CJ said it wasn't them. Did you try calling?  Then daughter said she thought it was regarding results.

## 2015-12-21 NOTE — Progress Notes (Signed)
Quick Note:  LMOM for a return call. ______ 

## 2015-12-21 NOTE — Telephone Encounter (Signed)
See results note. I called again and [facility] for a return call.

## 2015-12-25 ENCOUNTER — Emergency Department (HOSPITAL_COMMUNITY)
Admission: EM | Admit: 2015-12-25 | Discharge: 2015-12-25 | Disposition: A | Discharge status: Home / Self Care | Payer: Medicare Other | Source: Home / Self Care | Attending: Emergency Medicine | Admitting: Emergency Medicine

## 2015-12-25 ENCOUNTER — Encounter (HOSPITAL_COMMUNITY): Payer: Self-pay

## 2015-12-25 ENCOUNTER — Other Ambulatory Visit: Payer: Self-pay

## 2015-12-25 ENCOUNTER — Emergency Department (HOSPITAL_COMMUNITY): Payer: Medicare Other

## 2015-12-25 DIAGNOSIS — I480 Paroxysmal atrial fibrillation: Secondary | ICD-10-CM

## 2015-12-25 DIAGNOSIS — Z87891 Personal history of nicotine dependence: Secondary | ICD-10-CM

## 2015-12-25 DIAGNOSIS — S0990XA Unspecified injury of head, initial encounter: Secondary | ICD-10-CM | POA: Diagnosis not present

## 2015-12-25 DIAGNOSIS — I10 Essential (primary) hypertension: Secondary | ICD-10-CM | POA: Insufficient documentation

## 2015-12-25 DIAGNOSIS — R42 Dizziness and giddiness: Secondary | ICD-10-CM | POA: Diagnosis present

## 2015-12-25 DIAGNOSIS — I5032 Chronic diastolic (congestive) heart failure: Secondary | ICD-10-CM | POA: Insufficient documentation

## 2015-12-25 DIAGNOSIS — M25512 Pain in left shoulder: Secondary | ICD-10-CM | POA: Diagnosis not present

## 2015-12-25 DIAGNOSIS — Z79899 Other long term (current) drug therapy: Secondary | ICD-10-CM | POA: Insufficient documentation

## 2015-12-25 DIAGNOSIS — N289 Disorder of kidney and ureter, unspecified: Secondary | ICD-10-CM | POA: Diagnosis not present

## 2015-12-25 DIAGNOSIS — E039 Hypothyroidism, unspecified: Secondary | ICD-10-CM | POA: Diagnosis not present

## 2015-12-25 DIAGNOSIS — Y999 Unspecified external cause status: Secondary | ICD-10-CM

## 2015-12-25 DIAGNOSIS — E785 Hyperlipidemia, unspecified: Secondary | ICD-10-CM | POA: Insufficient documentation

## 2015-12-25 DIAGNOSIS — Z794 Long term (current) use of insulin: Secondary | ICD-10-CM | POA: Insufficient documentation

## 2015-12-25 DIAGNOSIS — W19XXXA Unspecified fall, initial encounter: Secondary | ICD-10-CM | POA: Insufficient documentation

## 2015-12-25 DIAGNOSIS — E119 Type 2 diabetes mellitus without complications: Secondary | ICD-10-CM | POA: Insufficient documentation

## 2015-12-25 DIAGNOSIS — Z7984 Long term (current) use of oral hypoglycemic drugs: Secondary | ICD-10-CM | POA: Insufficient documentation

## 2015-12-25 DIAGNOSIS — Y939 Activity, unspecified: Secondary | ICD-10-CM | POA: Insufficient documentation

## 2015-12-25 DIAGNOSIS — I959 Hypotension, unspecified: Secondary | ICD-10-CM | POA: Diagnosis not present

## 2015-12-25 DIAGNOSIS — E860 Dehydration: Secondary | ICD-10-CM | POA: Insufficient documentation

## 2015-12-25 DIAGNOSIS — Y929 Unspecified place or not applicable: Secondary | ICD-10-CM

## 2015-12-25 DIAGNOSIS — S199XXA Unspecified injury of neck, initial encounter: Secondary | ICD-10-CM | POA: Diagnosis not present

## 2015-12-25 DIAGNOSIS — R55 Syncope and collapse: Secondary | ICD-10-CM | POA: Diagnosis not present

## 2015-12-25 DIAGNOSIS — R404 Transient alteration of awareness: Secondary | ICD-10-CM | POA: Diagnosis not present

## 2015-12-25 DIAGNOSIS — S59902A Unspecified injury of left elbow, initial encounter: Secondary | ICD-10-CM | POA: Diagnosis not present

## 2015-12-25 DIAGNOSIS — R197 Diarrhea, unspecified: Secondary | ICD-10-CM | POA: Diagnosis not present

## 2015-12-25 DIAGNOSIS — S4992XA Unspecified injury of left shoulder and upper arm, initial encounter: Secondary | ICD-10-CM | POA: Diagnosis not present

## 2015-12-25 LAB — BASIC METABOLIC PANEL
Anion gap: 10 (ref 5–15)
BUN: 24 mg/dL — ABNORMAL HIGH (ref 6–20)
CO2: 25 mmol/L (ref 22–32)
Calcium: 8.8 mg/dL — ABNORMAL LOW (ref 8.9–10.3)
Chloride: 102 mmol/L (ref 101–111)
Creatinine, Ser: 1.57 mg/dL — ABNORMAL HIGH (ref 0.44–1.00)
GFR calc Af Amer: 39 mL/min — ABNORMAL LOW (ref >60)
GFR calc non Af Amer: 33 mL/min — ABNORMAL LOW (ref >60)
Glucose, Bld: 223 mg/dL — ABNORMAL HIGH (ref 65–99)
Potassium: 3.9 mmol/L (ref 3.5–5.1)
Sodium: 137 mmol/L (ref 135–145)

## 2015-12-25 LAB — CBC WITH DIFFERENTIAL/PLATELET
Basophils Absolute: 0 10*3/uL (ref 0.0–0.1)
Basophils Relative: 0 %
Eosinophils Absolute: 0.1 10*3/uL (ref 0.0–0.7)
Eosinophils Relative: 0 %
HCT: 44.4 % (ref 36.0–46.0)
Hemoglobin: 14.6 g/dL (ref 12.0–15.0)
Lymphocytes Relative: 16 %
Lymphs Abs: 2.2 10*3/uL (ref 0.7–4.0)
MCH: 31.5 pg (ref 26.0–34.0)
MCHC: 32.9 g/dL (ref 30.0–36.0)
MCV: 95.9 fL (ref 78.0–100.0)
Monocytes Absolute: 1.1 10*3/uL — ABNORMAL HIGH (ref 0.1–1.0)
Monocytes Relative: 8 %
Neutro Abs: 10.8 10*3/uL — ABNORMAL HIGH (ref 1.7–7.7)
Neutrophils Relative %: 76 %
Platelets: 228 10*3/uL (ref 150–400)
RBC: 4.63 MIL/uL (ref 3.87–5.11)
RDW: 13.5 % (ref 11.5–15.5)
WBC: 14.3 10*3/uL — ABNORMAL HIGH (ref 4.0–10.5)

## 2015-12-25 LAB — TROPONIN I: Troponin I: 0.04 ng/mL — ABNORMAL HIGH (ref <0.031)

## 2015-12-25 MED ORDER — HYDROMORPHONE HCL 1 MG/ML IJ SOLN
0.5000 mg | Freq: Once | INTRAMUSCULAR | Status: AC
  Administered 2015-12-25: 0.5 mg via INTRAVENOUS
  Filled 2015-12-25: qty 1

## 2015-12-25 MED ORDER — SODIUM CHLORIDE 0.9 % IV BOLUS (SEPSIS)
1000.0000 mL | Freq: Once | INTRAVENOUS | Status: AC
  Administered 2015-12-25: 1000 mL via INTRAVENOUS

## 2015-12-25 NOTE — Discharge Instructions (Signed)
Here labs today are consistent with dehydration. This may have contributed to your fall. I want you to hold you Lasix for the rest of today and tomorrow. After that, resume as previously prescribed.   Dehydration, Adult Dehydration is a condition in which you do not have enough fluid or water in your body. It happens when you take in less fluid than you lose. Vital organs such as the kidneys, brain, and heart cannot function without a proper amount of fluids. Any loss of fluids from the body can cause dehydration.  Dehydration can range from mild to severe. This condition should be treated right away to help prevent it from becoming severe. CAUSES  This condition may be caused by:  Vomiting.  Diarrhea.  Excessive sweating, such as when exercising in hot or humid weather.  Not drinking enough fluid during strenuous exercise or during an illness.  Excessive urine output.  Fever.  Certain medicines. RISK FACTORS This condition is more likely to develop in:  People who are taking certain medicines that cause the body to lose excess fluid (diuretics).   People who have a chronic illness, such as diabetes, that may increase urination.  Older adults.   People who live at high altitudes.   People who participate in endurance sports.  SYMPTOMS  Mild Dehydration  Thirst.  Dry lips.  Slightly dry mouth.  Dry, warm skin. Moderate Dehydration  Very dry mouth.   Muscle cramps.   Dark urine and decreased urine production.   Decreased tear production.   Headache.   Light-headedness, especially when you stand up from a sitting position.  Severe Dehydration  Changes in skin.   Cold and clammy skin.   Skin does not spring back quickly when lightly pinched and released.   Changes in body fluids.   Extreme thirst.   No tears.   Not able to sweat when body temperature is high, such as in hot weather.   Minimal urine production.   Changes in vital  signs.   Rapid, weak pulse (more than 100 beats per minute when you are sitting still).   Rapid breathing.   Low blood pressure.   Other changes.   Sunken eyes.   Cold hands and feet.   Confusion.  Lethargy and difficulty being awakened.  Fainting (syncope).   Short-term weight loss.   Unconsciousness. DIAGNOSIS  This condition may be diagnosed based on your symptoms. You may also have tests to determine how severe your dehydration is. These tests may include:   Urine tests.   Blood tests.  TREATMENT  Treatment for this condition depends on the severity. Mild or moderate dehydration can often be treated at home. Treatment should be started right away. Do not wait until dehydration becomes severe. Severe dehydration needs to be treated at the hospital. Treatment for Mild Dehydration  Drinking plenty of water to replace the fluid you have lost.   Replacing minerals in your blood (electrolytes) that you may have lost.  Treatment for Moderate Dehydration  Consuming oral rehydration solution (ORS). Treatment for Severe Dehydration  Receiving fluid through an IV tube.   Receiving electrolyte solution through a feeding tube that is passed through your nose and into your stomach (nasogastric tube or NG tube).  Correcting any abnormalities in electrolytes. HOME CARE INSTRUCTIONS   Drink enough fluid to keep your urine clear or pale yellow.   Drink water or fluid slowly by taking small sips. You can also try sucking on ice cubes.  Have food  or beverages that contain electrolytes. Examples include bananas and sports drinks.  Take over-the-counter and prescription medicines only as told by your health care provider.   Prepare ORS according to the manufacturer's instructions. Take sips of ORS every 5 minutes until your urine returns to normal.  If you have vomiting or diarrhea, continue to try to drink water, ORS, or both.   If you have diarrhea,  avoid:   Beverages that contain caffeine.   Fruit juice.   Milk.   Carbonated soft drinks.  Do not take salt tablets. This can lead to the condition of having too much sodium in your body (hypernatremia).  SEEK MEDICAL CARE IF:  You cannot eat or drink without vomiting.  You have had moderate diarrhea during a period of more than 24 hours.  You have a fever. SEEK IMMEDIATE MEDICAL CARE IF:   You have extreme thirst.  You have severe diarrhea.  You have not urinated in 6-8 hours, or you have urinated only a small amount of very dark urine.  You have shriveled skin.  You are dizzy, confused, or both.   This information is not intended to replace advice given to you by your health care provider. Make sure you discuss any questions you have with your health care provider.   Document Released: 06/20/2005 Document Revised: 03/11/2015 Document Reviewed: 11/05/2014 Elsevier Interactive Patient Education [employer]

## 2015-12-25 NOTE — ED Provider Notes (Signed)
CSN: [id number]     Arrival date & time 12/25/15  1927 History   First MD Initiated Contact with Patient 12/25/15 1946     Chief Complaint  Patient presents with  . Loss of Consciousness     (Consider location/radiation/quality/duration/timing/severity/associated sxs/prior Treatment) HPI   65 year old female presenting after a fall. Happened shortly before arrival. Patient was in her bathroom where she had just finished washing her hands when she turned and then fell to the ground. She does have a history of partial seizures. She is not sure she may have had one of these. Denies any dizziness or lightheadedness. She is complaining of some left upper chest/left shoulder/left upper back pain but states that this happened after the fall. She doesn't think she struck her head. She is on Eliquis though. She felt like she was in her usual state of health shortly before this.  Past Medical History  Diagnosis Date  . Chest pain     Negative cardiac catheterization in 2002; negative stress nuclear study in 2008  . Chronic diastolic CHF (congestive heart failure) (HCC)     a. EF predominantly normal during prior echoes but was 40% during 10/2014 echo. b. Most recent 01/2015 EF normal, 55-60%.  . Paroxysmal atrial fibrillation (HCC)   . Chronic anticoagulation   . Diabetes mellitus, type 2 (HCC)     Insulin therapy; exacerbated by prednisone  . Syncope     a. Admitted 05/2009; magnetic resonance imagin/ MRA - negative; etiology thought to be orthostasis secondary to drugs and dehydration. b. Syncope 02/2015 also felt 2/2 dehydration.  Marland Kitchen GERD (gastroesophageal reflux disease)   . IBS (irritable bowel syndrome)   . Hiatal hernia   . Gastroparesis     99% retention 05/2008 on GES  . Hyperlipidemia   . Hypertension   . Hypothyroid   . Chronic LBP     Surgical intervention in 1996  . Obesity   . OSA on CPAP   . Anemia     H/H of 10/30 with a normal MCV in 12/09  . Barrett's esophagus      Diagnosed 1995. Last EGD 2016-NO BARRETT'S.   . Seizures (HCC)   . Pulmonary hypertension (HCC) 01/2015    a. Predominantly pulmonary venous hypertension but may be component of PAH.   Past Surgical History  Procedure Laterality Date  . Cardiovascular stress test  2008    Stress nuclear study  . Back surgery  1996  . Laparoscopic cholecystectomy  1990s  . Total abdominal hysterectomy  1999  . Laminectomy  1995    L4-L5  . Carpal tunnel release  1994  . Esophagogastroduodenoscopy  2008    Barrett's without dysplasia. esphagus dilated. antral erosions, h.pylori serologies negative.  . Colonoscopy  11/2011    Dr. Darrick Penna: Internal hemorrhoids, mild diverticulosis. Random colon biopsies negative.  . Givens capsule study  12/07/2011    Proximal small bowel, rare AVM. Distal small bowel, multiple ulcers noted  . Esophagogastroduodenoscopy  11/2011    Dr. Darrick Penna: Barrett's esophagus, mild gastritis, diverticulum in the second portion of the duodenum repeat EGD 3 years. Small bowel biopsies negative. Gastric biopsy show reactive gastropathy but no H. pylori. Esophageal biopsies consistent with GERD. Next EGD 11/2014  . Givens capsule study N/A 09/27/2013    Distal small bowel ulcers extending to TI.  Marland Kitchen Givens capsule study N/A 10/10/2013    Procedure: GIVENS CAPSULE STUDY;  Surgeon: West Bali, MD;  Location: [facility];  Service: Endoscopy;  Laterality: N/A;  7:30  . Colonoscopy N/A 11/08/2013    SLF: Normal mucosa in the terminal ileum/The colon IS redundant/  Moderate diverticulosis throughout the entire colon. ileum bx benign. colon bx benign  . Biopsy N/A 11/08/2013    Procedure: BIOPSY  / Tissue sampling / ulcers present in small intestine;  Surgeon: West Bali, MD;  Location: [facility];  Service: Endoscopy;  Laterality: N/A;  . Cardiac catheterization  2002  . Right heart catheterization N/A 01/10/2014    Procedure: RIGHT HEART CATH;  Surgeon: Lesleigh Noe, MD;  Location: [facility] CATH  LAB;  Service: Cardiovascular;  Laterality: N/A;  . Left heart catheterization with coronary angiogram  01/10/2014    Procedure: LEFT HEART CATHETERIZATION WITH CORONARY ANGIOGRAM;  Surgeon: Lesleigh Noe, MD;  Location: [facility] CATH LAB;  Service: Cardiovascular;;  . Esophagogastroduodenoscopy N/A 11/21/2014    VQQ:VZDG non-erosive gastritis/irregular z-line  . Cardiac catheterization N/A 01/26/2015    Procedure: Right Heart Cath;  Surgeon: Laurey Morale, MD;  Location: [facility] INVASIVE CV LAB;  Service: Cardiovascular;  Laterality: N/A;  . Cardioversion N/A 03/06/2015    Procedure: CARDIOVERSION;  Surgeon: Laurey Morale, MD;  Location: [facility] ENDOSCOPY;  Service: Cardiovascular;  Laterality: N/A;   Family History  Problem Relation Age of Onset  . Hypertension Mother   . Alzheimer's disease Mother   . Stroke Mother   . Heart attack Mother   . Heart disease Neg Hx   . Hypertension Other   . Colon cancer Neg Hx   . Breast cancer Sister    Social History  Substance Use Topics  . Smoking status: Former Smoker -- 0.25 packs/day for 15 years    Types: Cigarettes    Start date: 02/26/1966    Quit date: 07/01/1983  . Smokeless tobacco: Never Used     Comment: quit in 1984  . Alcohol Use: No     Comment: last etoh one year ago, never frequent   OB History    Gravida Para Term Preterm AB TAB SAB Ectopic Multiple Living   Review of Systems  All systems reviewed and negative, other than as noted in HPI.   Allergies  Ibuprofen; Codeine; Celexa; and Reglan  Home Medications   Prior to Admission medications   Medication Sig Start Date End Date Taking? Authorizing Provider  acetaminophen (TYLENOL) 500 MG tablet Take 500 mg by mouth every 6 (six) hours as needed for moderate pain or headache.   Yes Historical Provider, MD  amiodarone (PACERONE) 200 MG tablet TAKE ONE TABLET BY MOUTH DAILY. 10/20/15  Yes Laurey Morale, MD  apixaban (ELIQUIS) 5 MG TABS tablet Take 5 mg by  mouth 2 (two) times daily.   Yes Historical Provider, MD  dicyclomine (BENTYL) 10 MG capsule 1-2 capsules before meals as needed for abdominal cramping and loose stool 12/07/15  Yes Nira Retort, NP  DULoxetine (CYMBALTA) 30 MG capsule Take 30 mg by mouth 2 (two) times daily.   Yes Historical Provider, MD  famotidine (PEPCID) 20 MG tablet Take 20 mg by mouth daily as needed for heartburn or indigestion.    Yes Historical Provider, MD  furosemide (LASIX) 20 MG tablet Take 60 mg by mouth 2 (two) times daily.    Yes Historical Provider, MD  gabapentin (NEURONTIN) 300 MG capsule Take 300 mg by mouth 3 (three) times daily.   Yes Historical Provider,  MD  glipiZIDE (GLUCOTROL) 10 MG tablet Take 10 mg by mouth 2 (two) times daily before a meal.    Yes Historical Provider, MD  HYDROcodone-acetaminophen (NORCO/VICODIN) 5-325 MG tablet Take 1 tablet by mouth 2 (two) times daily.   Yes Historical Provider, MD  Insulin Glargine (LANTUS SOLOSTAR) 100 UNIT/ML Solostar Pen Inject 20-50 Units into the skin at bedtime as needed. Patient takes 20 units every night at bedtime if over 178.  Takes 50 units if her blood sugar is above 300.   Yes Historical Provider, MD  levETIRAcetam (KEPPRA) 750 MG tablet Take 750 mg by mouth 2 (two) times daily.   Yes Historical Provider, MD  levothyroxine (SYNTHROID, LEVOTHROID) 137 MCG tablet Take 137 mcg by mouth daily. 10/14/14  Yes Historical Provider, MD  LORazepam (ATIVAN) 1 MG tablet Take 1 mg by mouth 3 (three) times daily as needed for anxiety or sleep. For anxiety   Yes Historical Provider, MD  magnesium oxide (MAG-OX) 400 MG tablet Take 400 mg by mouth daily.    Yes Historical Provider, MD  metaxalone (SKELAXIN) 800 MG tablet Take 800 mg by mouth 3 (three) times daily.  10/04/14  Yes Historical Provider, MD  metFORMIN (GLUCOPHAGE) 500 MG tablet Take 1,000 mg by mouth 2 (two) times daily with a meal.    Yes Historical Provider, MD  Multiple Vitamin (MULTIVITAMIN WITH MINERALS) TABS  tablet Take 1 tablet by mouth daily.   Yes Historical Provider, MD  nitroGLYCERIN (NITROSTAT) 0.4 MG SL tablet Place 0.4 mg under the tongue every 5 (five) minutes as needed for chest pain. Reported on 07/08/2015   Yes Historical Provider, MD  ondansetron (ZOFRAN) 4 MG tablet Take 1 tablet (4 mg total) by mouth 4 (four) times daily -  before meals and at bedtime. As needed for nausea. 12/07/15  Yes Nira Retort, NP  pantoprazole (PROTONIX) 40 MG tablet Take 1 tablet (40 mg total) by mouth 2 (two) times daily before a meal. 12/07/15  Yes Nira Retort, NP  POLY-IRON 150 150 MG capsule TAKE 1 CAPSULE BY MOUTH 2 TIMES DAILY. 06/02/15  Yes Maurine Minister Kefalas, PA-C  potassium chloride (K-DUR) 10 MEQ tablet Take 2 tablets (20 mEq total) by mouth 2 (two) times daily. 10/20/15  Yes Graciella Freer, PA-C  vitamin B-12 (CYANOCOBALAMIN) 500 MCG tablet Take 500 mcg by mouth daily.   Yes Historical Provider, MD   BP 115/72 mmHg  Pulse 63  Resp 20  Ht 5\' 6"  (1.676 m)  Wt 230 lb (104.327 kg)  BMI 37.14 kg/m2  SpO2 97% Physical Exam  Constitutional: She is oriented to person, place, and time. She appears well-developed and well-nourished. No distress.  HENT:  Head: Normocephalic and atraumatic.  Eyes: Conjunctivae are normal. Right eye exhibits no discharge. Left eye exhibits no discharge.  Neck: Neck supple.  Cardiovascular: Normal rate, regular rhythm and normal heart sounds.  Exam reveals no gallop and no friction rub.   No murmur heard. Pulmonary/Chest: Effort normal and breath sounds normal. No respiratory distress.  Abdominal: Soft. She exhibits no distension. There is no tenderness.  Musculoskeletal: She exhibits no edema or tenderness.  Tenderness to palpation along the left anterior shoulder. Significant increase in pain with both passive and active abduction of the shoulder. Neurovascularly intact.  Neurological: She is alert and oriented to person, place, and time. No cranial nerve deficit. She  exhibits normal muscle tone. Coordination normal.  Speech is clear. Content appropriate. Follows commands. Cranial nerves II through  XII are intact. Sensation is intact to light touch. Strength is 5 out of 5 bilateral upper lower extremities. Good finger to nose testing bilaterally.  Skin: Skin is warm and dry.  Psychiatric: She has a normal mood and affect. Her behavior is normal. Thought content normal.  Nursing note and vitals reviewed.   ED Course  Procedures (including critical care time) Labs Review Labs Reviewed  CBC WITH DIFFERENTIAL/PLATELET - Abnormal; Notable for the following:    WBC 14.3 (*)    Neutro Abs 10.8 (*)    Monocytes Absolute 1.1 (*)    All other components within normal limits  BASIC METABOLIC PANEL - Abnormal; Notable for the following:    Glucose, Bld 223 (*)    BUN 24 (*)    Creatinine, Ser 1.57 (*)    Calcium 8.8 (*)    GFR calc non Af Amer 33 (*)    GFR calc Af Amer 39 (*)    All other components within normal limits  TROPONIN I - Abnormal; Notable for the following:    Troponin I 0.04 (*)    All other components within normal limits    Imaging Review Dg Shoulder Left  12/25/2015  CLINICAL DATA:  LEFT shoulder pain, got dizzy and passed out while walking to the bathroom, fall, history diabetes mellitus, hypertension, CHF EXAM: LEFT SHOULDER - 2+ VIEW COMPARISON:  07/11/2013 FINDINGS: Marked osseous demineralization. AC joint alignment normal. No acute fracture, dislocation, or bone destruction. Visualized LEFT ribs grossly intact. Subsegmental atelectasis lower LEFT lung. IMPRESSION: Marked osseous demineralization. No definite acute bony abnormalities. Electronically Signed   By: Ulyses Southward M.D.   On: 12/25/2015 21:17   I have personally reviewed and evaluated these images and lab results as part of my medical decision-making.   EKG Interpretation   Date/Time:  Friday December 25 2015 19:31:35 EDT Ventricular Rate:  61 PR Interval:    QRS Duration:  109 QT Interval:  483 QTC Calculation: 487 R Axis:   -59 Text Interpretation:  Sinus rhythm Left anterior fascicular block Abnormal  R-wave progression, early transition Nonspecific T abnrm, anterolateral  leads Borderline prolonged QT interval No significant change since last  tracing Confirmed by Juleen China  MD, Devontre Siedschlag ([id number]) on 12/25/2015 9:39:31 PM      MDM   Final diagnoses:  Fall, initial encounter  Dehydration    65 year old female presenting after fall. Not completely clear based on her history of present illness whether this was a syncopal event versus possibly partial seizure? Currently feeling much better aside from left shoulder pain. She states this started after the fall. Her exam is nonfocal but she is on Eliquis. Will CT her head. EKG is without acute abnormality. Labs are pending.  Creatinine is close to double from last one in September with elevated BUN. Consistent with some dehydration. She was given a liter of IV fluids And feels better, but still not quite back to baseline. Advised her to hold her Lasix today and tomorrow and then resume previous dosing. Stay well-hydrated. Return precautions discussed.  Raeford Razor, MD 12/29/15 1140

## 2015-12-25 NOTE — ED Notes (Signed)
Pt states she has been dizzy for a week, saw her doctor for same and has tried some meds without relief.  Pt states she was heading toward the bathroom and feels she got dizzy and passed out.  Pt c/o pain to right shoulder/scapula.

## 2015-12-25 NOTE — ED Notes (Signed)
Pt is in CT at this time.

## 2015-12-26 ENCOUNTER — Other Ambulatory Visit: Payer: Self-pay

## 2015-12-26 ENCOUNTER — Encounter (HOSPITAL_COMMUNITY): Payer: Self-pay

## 2015-12-26 ENCOUNTER — Observation Stay (HOSPITAL_COMMUNITY)
Admission: EM | Admit: 2015-12-26 | Discharge: 2015-12-28 | Disposition: A | Discharge status: Home / Self Care | Payer: Medicare Other | Attending: Family Medicine | Admitting: Family Medicine

## 2015-12-26 ENCOUNTER — Observation Stay (HOSPITAL_BASED_OUTPATIENT_CLINIC_OR_DEPARTMENT_OTHER): Payer: Medicare Other

## 2015-12-26 DIAGNOSIS — R55 Syncope and collapse: Secondary | ICD-10-CM

## 2015-12-26 DIAGNOSIS — F0390 Unspecified dementia without behavioral disturbance: Secondary | ICD-10-CM | POA: Diagnosis not present

## 2015-12-26 DIAGNOSIS — I10 Essential (primary) hypertension: Secondary | ICD-10-CM | POA: Diagnosis not present

## 2015-12-26 DIAGNOSIS — E119 Type 2 diabetes mellitus without complications: Secondary | ICD-10-CM

## 2015-12-26 DIAGNOSIS — N289 Disorder of kidney and ureter, unspecified: Secondary | ICD-10-CM

## 2015-12-26 DIAGNOSIS — Y92009 Unspecified place in unspecified non-institutional (private) residence as the place of occurrence of the external cause: Secondary | ICD-10-CM

## 2015-12-26 DIAGNOSIS — E785 Hyperlipidemia, unspecified: Secondary | ICD-10-CM | POA: Diagnosis present

## 2015-12-26 DIAGNOSIS — W19XXXA Unspecified fall, initial encounter: Secondary | ICD-10-CM

## 2015-12-26 DIAGNOSIS — I959 Hypotension, unspecified: Secondary | ICD-10-CM | POA: Diagnosis not present

## 2015-12-26 DIAGNOSIS — I5032 Chronic diastolic (congestive) heart failure: Secondary | ICD-10-CM

## 2015-12-26 DIAGNOSIS — Y92099 Unspecified place in other non-institutional residence as the place of occurrence of the external cause: Secondary | ICD-10-CM

## 2015-12-26 LAB — CBC WITH DIFFERENTIAL/PLATELET
Basophils Absolute: 0 10*3/uL (ref 0.0–0.1)
Basophils Relative: 0 %
EOS PCT: 0 %
Eosinophils Absolute: 0.1 10*3/uL (ref 0.0–0.7)
HCT: 39.7 % (ref 36.0–46.0)
Hemoglobin: 13.4 g/dL (ref 12.0–15.0)
LYMPHS ABS: 2.4 10*3/uL (ref 0.7–4.0)
LYMPHS PCT: 20 %
MCH: 32.2 pg (ref 26.0–34.0)
MCHC: 33.8 g/dL (ref 30.0–36.0)
MCV: 95.4 fL (ref 78.0–100.0)
MONO ABS: 1.2 10*3/uL — AB (ref 0.1–1.0)
MONOS PCT: 10 %
Neutro Abs: 8.4 10*3/uL — ABNORMAL HIGH (ref 1.7–7.7)
Neutrophils Relative %: 70 %
PLATELETS: 191 10*3/uL (ref 150–400)
RBC: 4.16 MIL/uL (ref 3.87–5.11)
RDW: 13.5 % (ref 11.5–15.5)
WBC: 12.1 10*3/uL — ABNORMAL HIGH (ref 4.0–10.5)

## 2015-12-26 LAB — COMPREHENSIVE METABOLIC PANEL
ALBUMIN: 3.6 g/dL (ref 3.5–5.0)
ALT: 18 U/L (ref 14–54)
AST: 23 U/L (ref 15–41)
Alkaline Phosphatase: 73 U/L (ref 38–126)
Anion gap: 8 (ref 5–15)
BUN: 25 mg/dL — AB (ref 6–20)
CHLORIDE: 104 mmol/L (ref 101–111)
CO2: 25 mmol/L (ref 22–32)
CREATININE: 1.61 mg/dL — AB (ref 0.44–1.00)
Calcium: 8.4 mg/dL — ABNORMAL LOW (ref 8.9–10.3)
GFR calc Af Amer: 37 mL/min — ABNORMAL LOW (ref >60)
GFR calc non Af Amer: 32 mL/min — ABNORMAL LOW (ref >60)
GLUCOSE: 178 mg/dL — AB (ref 65–99)
POTASSIUM: 3.7 mmol/L (ref 3.5–5.1)
Sodium: 137 mmol/L (ref 135–145)
Total Bilirubin: 0.7 mg/dL (ref 0.3–1.2)
Total Protein: 6.7 g/dL (ref 6.5–8.1)

## 2015-12-26 LAB — ECHOCARDIOGRAM COMPLETE
CHL CUP MV DEC (S): 338
CHL CUP TV REG PEAK VELOCITY: 320 cm/s
E/e' ratio: 6.77
EWDT: 338 ms
FS: 21 % — AB (ref 28–44)
Height: 66 in
IVS/LV PW RATIO, ED: 1.04
LA ID, A-P, ES: 51 mm
LA vol index: 40 mL/m2
LA vol: 83.5 mL
LADIAMINDEX: 2.44 cm/m2
LAVOLA4C: 88.7 mL
LEFT ATRIUM END SYS DIAM: 51 mm
LV E/e'average: 6.77
LV PW d: 19.4 mm — AB (ref 0.6–1.1)
LV TDI E'LATERAL: 9.25
LV TDI E'MEDIAL: 2.94
LV e' LATERAL: 9.25 cm/s
LVEEMED: 6.77
MV pk A vel: 78.6 m/s
MV pk E vel: 62.6 m/s
RV TAPSE: 19.8 mm
TRMAXVEL: 320 cm/s
Weight: 3548.52 oz

## 2015-12-26 LAB — BASIC METABOLIC PANEL
Anion gap: 7 (ref 5–15)
BUN: 24 mg/dL — AB (ref 6–20)
CHLORIDE: 108 mmol/L (ref 101–111)
CO2: 23 mmol/L (ref 22–32)
Calcium: 8.3 mg/dL — ABNORMAL LOW (ref 8.9–10.3)
Creatinine, Ser: 1.44 mg/dL — ABNORMAL HIGH (ref 0.44–1.00)
GFR calc Af Amer: 43 mL/min — ABNORMAL LOW (ref >60)
GFR calc non Af Amer: 37 mL/min — ABNORMAL LOW (ref >60)
GLUCOSE: 173 mg/dL — AB (ref 65–99)
POTASSIUM: 3.5 mmol/L (ref 3.5–5.1)
Sodium: 138 mmol/L (ref 135–145)

## 2015-12-26 LAB — CBC
HEMATOCRIT: 38.3 % (ref 36.0–46.0)
Hemoglobin: 12.7 g/dL (ref 12.0–15.0)
MCH: 31.8 pg (ref 26.0–34.0)
MCHC: 33.2 g/dL (ref 30.0–36.0)
MCV: 96 fL (ref 78.0–100.0)
Platelets: 194 10*3/uL (ref 150–400)
RBC: 3.99 MIL/uL (ref 3.87–5.11)
RDW: 13.6 % (ref 11.5–15.5)
WBC: 9.8 10*3/uL (ref 4.0–10.5)

## 2015-12-26 LAB — GLUCOSE, CAPILLARY
GLUCOSE-CAPILLARY: 164 mg/dL — AB (ref 65–99)
GLUCOSE-CAPILLARY: 139 mg/dL — AB (ref 65–99)
GLUCOSE-CAPILLARY: 195 mg/dL — AB (ref 65–99)
GLUCOSE-CAPILLARY: 215 mg/dL — AB (ref 65–99)

## 2015-12-26 LAB — TROPONIN I
TROPONIN I: 0.04 ng/mL — AB (ref <0.031)
TROPONIN I: 0.04 ng/mL — AB (ref <0.031)
Troponin I: 0.04 ng/mL — ABNORMAL HIGH (ref <0.031)
Troponin I: 0.05 ng/mL — ABNORMAL HIGH (ref <0.031)

## 2015-12-26 LAB — TSH: TSH: 2.553 u[IU]/mL (ref 0.350–4.500)

## 2015-12-26 MED ORDER — SODIUM CHLORIDE 0.9 % IV SOLN
INTRAVENOUS | Status: DC
  Administered 2015-12-26: 05:00:00 via INTRAVENOUS

## 2015-12-26 MED ORDER — DULOXETINE HCL 30 MG PO CPEP
30.0000 mg | ORAL_CAPSULE | Freq: Two times a day (BID) | ORAL | Status: DC
  Administered 2015-12-26 – 2015-12-28 (×5): 30 mg via ORAL
  Filled 2015-12-26 (×5): qty 1

## 2015-12-26 MED ORDER — LEVETIRACETAM 500 MG PO TABS
750.0000 mg | ORAL_TABLET | Freq: Two times a day (BID) | ORAL | Status: DC
  Administered 2015-12-26 – 2015-12-28 (×5): 750 mg via ORAL
  Filled 2015-12-26 (×5): qty 1

## 2015-12-26 MED ORDER — DIATRIZOATE MEGLUMINE & SODIUM 66-10 % PO SOLN
ORAL | Status: AC
  Filled 2015-12-26: qty 30

## 2015-12-26 MED ORDER — ACETAMINOPHEN 500 MG PO TABS: 500.0000 mg | ORAL_TABLET | Freq: Four times a day (QID) | ORAL | Status: DC | PRN

## 2015-12-26 MED ORDER — PANTOPRAZOLE SODIUM 40 MG PO TBEC
40.0000 mg | DELAYED_RELEASE_TABLET | Freq: Two times a day (BID) | ORAL | Status: DC
Start: 2015-12-26 — End: 2015-12-28
  Administered 2015-12-26 – 2015-12-28 (×5): 40 mg via ORAL
  Filled 2015-12-26 (×5): qty 1

## 2015-12-26 MED ORDER — ONDANSETRON HCL 4 MG PO TABS: 4.0000 mg | ORAL_TABLET | Freq: Four times a day (QID) | ORAL | Status: DC | PRN

## 2015-12-26 MED ORDER — FAMOTIDINE 20 MG PO TABS: 20.0000 mg | ORAL_TABLET | Freq: Every day | ORAL | Status: DC | PRN

## 2015-12-26 MED ORDER — AMIODARONE HCL 200 MG PO TABS
200.0000 mg | ORAL_TABLET | Freq: Every day | ORAL | Status: DC
  Administered 2015-12-26 – 2015-12-28 (×3): 200 mg via ORAL
  Filled 2015-12-26 (×3): qty 1

## 2015-12-26 MED ORDER — POLYSACCHARIDE IRON COMPLEX 150 MG PO CAPS
150.0000 mg | ORAL_CAPSULE | Freq: Every day | ORAL | Status: DC
  Administered 2015-12-26 – 2015-12-28 (×3): 150 mg via ORAL
  Filled 2015-12-26 (×3): qty 1

## 2015-12-26 MED ORDER — IOPAMIDOL (ISOVUE-300) INJECTION 61%
INTRAVENOUS | Status: AC
  Filled 2015-12-26: qty 100

## 2015-12-26 MED ORDER — GLIPIZIDE 5 MG PO TABS
10.0000 mg | ORAL_TABLET | Freq: Two times a day (BID) | ORAL | Status: DC
  Administered 2015-12-26 – 2015-12-28 (×5): 10 mg via ORAL
  Filled 2015-12-26 (×5): qty 2

## 2015-12-26 MED ORDER — ONDANSETRON HCL 4 MG/2ML IJ SOLN: 4.0000 mg | Freq: Four times a day (QID) | INTRAMUSCULAR | Status: DC | PRN

## 2015-12-26 MED ORDER — APIXABAN 5 MG PO TABS
5.0000 mg | ORAL_TABLET | Freq: Two times a day (BID) | ORAL | Status: DC
  Administered 2015-12-26 – 2015-12-28 (×5): 5 mg via ORAL
  Filled 2015-12-26 (×5): qty 1

## 2015-12-26 MED ORDER — SODIUM CHLORIDE 0.9% FLUSH
3.0000 mL | Freq: Two times a day (BID) | INTRAVENOUS | Status: DC
  Administered 2015-12-26 – 2015-12-27 (×2): 3 mL via INTRAVENOUS

## 2015-12-26 MED ORDER — INSULIN ASPART 100 UNIT/ML ~~LOC~~ SOLN
0.0000 [IU] | Freq: Three times a day (TID) | SUBCUTANEOUS | Status: DC
  Administered 2015-12-26: 4 [IU] via SUBCUTANEOUS
  Administered 2015-12-26: 3 [IU] via SUBCUTANEOUS
  Administered 2015-12-26: 4 [IU] via SUBCUTANEOUS
  Administered 2015-12-27: 7 [IU] via SUBCUTANEOUS
  Administered 2015-12-27: 3 [IU] via SUBCUTANEOUS
  Administered 2015-12-27 – 2015-12-28 (×2): 4 [IU] via SUBCUTANEOUS
  Administered 2015-12-28: 3 [IU] via SUBCUTANEOUS

## 2015-12-26 MED ORDER — SODIUM CHLORIDE 0.9 % IV BOLUS (SEPSIS)
1000.0000 mL | Freq: Once | INTRAVENOUS | Status: AC
  Administered 2015-12-26: 1000 mL via INTRAVENOUS

## 2015-12-26 MED ORDER — GABAPENTIN 300 MG PO CAPS
300.0000 mg | ORAL_CAPSULE | Freq: Three times a day (TID) | ORAL | Status: DC
  Administered 2015-12-26 – 2015-12-28 (×7): 300 mg via ORAL
  Filled 2015-12-26 (×7): qty 1

## 2015-12-26 MED ORDER — INSULIN GLARGINE 100 UNIT/ML ~~LOC~~ SOLN
20.0000 [IU] | Freq: Every day | SUBCUTANEOUS | Status: DC
  Administered 2015-12-26 – 2015-12-27 (×2): 20 [IU] via SUBCUTANEOUS
  Filled 2015-12-26 (×3): qty 0.2

## 2015-12-26 MED ORDER — VITAMIN B-12 1000 MCG PO TABS
500.0000 ug | ORAL_TABLET | Freq: Every day | ORAL | Status: DC
  Administered 2015-12-26 – 2015-12-28 (×3): 500 ug via ORAL
  Filled 2015-12-26 (×6): qty 1

## 2015-12-26 MED ORDER — INSULIN ASPART 100 UNIT/ML ~~LOC~~ SOLN
0.0000 [IU] | Freq: Every day | SUBCUTANEOUS | Status: DC
  Administered 2015-12-26: 2 [IU] via SUBCUTANEOUS

## 2015-12-26 MED ORDER — LEVOTHYROXINE SODIUM 25 MCG PO TABS
137.0000 ug | ORAL_TABLET | Freq: Every day | ORAL | Status: DC
  Administered 2015-12-26 – 2015-12-28 (×3): 137 ug via ORAL
  Filled 2015-12-26 (×3): qty 1

## 2015-12-26 MED ORDER — ADULT MULTIVITAMIN W/MINERALS CH
1.0000 | ORAL_TABLET | Freq: Every day | ORAL | Status: DC
  Administered 2015-12-26 – 2015-12-28 (×3): 1 via ORAL
  Filled 2015-12-26 (×3): qty 1

## 2015-12-26 NOTE — H&P (Signed)
History and Physical    [name] RUE:[id number] DOB: [birth year] DOA: 12/26/2015  Referring MD/NP/PA: Devoria Albe, MD PCP: [name], MD  Outpatient Specialists:   Cardiology; Kathlen Brunswick, MD  Gastroenterology; West Bali, MD  Hematology and Oncology; Allene Pyo, MD Patient coming from: home  Chief Complaint: Dizziness  HPI: [name] is a 65 y.o. female with medical history significant of DM type 2, HLD, hypothyroidism, essential HTN, afib on chronic Eliquis, diastolic CHF, GERD, and dementia, who was evaluated in the ED roughly 6 hours prior with complaints of shoulder pain s/p fall without LOC. She was found to be mildly dehydrated, but her shoulder xray returned negative so she was given Dilaudid and discharged home. She presents again complaining of dizziness, weakness, and inability to ambulate. Per daughter, upon returning home, the patient stood up to get out of the far and her eyes rolled backwards. She noted slurred speech and an inability to follow commands. The patient reports that she was aware of her daughter's commands. Per daughter, the patient also appeared to be on the verge of going limp. The patient reports that she has never had dilaudid before. She denies any CP. She reports that recently her blood sugar has not been in good control.   ED Course: While in the ED, workup revealed that her BUN and Cr were elevated. Blood pressure was originally soft in the 90's.  It responded to IVF.  Her troponin was also noted to be minimally elevated at 0.04. Hospitalist was asked to refer for admission for management of near syncope.   Review of Systems: As per HPI otherwise 10 point review of systems negative.   Past Medical History  Diagnosis Date  . Chest pain     Negative cardiac catheterization in 2002; negative stress nuclear study in 2008  . Chronic diastolic CHF (congestive heart failure) (HCC)     a. EF predominantly normal during prior  echoes but was 40% during 10/2014 echo. b. Most recent 01/2015 EF normal, 55-60%.  . Paroxysmal atrial fibrillation (HCC)   . Chronic anticoagulation   . Diabetes mellitus, type 2 (HCC)     Insulin therapy; exacerbated by prednisone  . Syncope     a. Admitted 05/2009; magnetic resonance imagin/ MRA - negative; etiology thought to be orthostasis secondary to drugs and dehydration. b. Syncope 02/2015 also felt 2/2 dehydration.  Marland Kitchen GERD (gastroesophageal reflux disease)   . IBS (irritable bowel syndrome)   . Hiatal hernia   . Gastroparesis     99% retention 05/2008 on GES  . Hyperlipidemia   . Hypertension   . Hypothyroid   . Chronic LBP     Surgical intervention in 1996  . Obesity   . OSA on CPAP   . Anemia     H/H of 10/30 with a normal MCV in 12/09  . Barrett's esophagus     Diagnosed 1995. Last EGD 2016-NO BARRETT'S.   . Seizures (HCC)   . Pulmonary hypertension (HCC) 01/2015    a. Predominantly pulmonary venous hypertension but may be component of PAH.    Past Surgical History  Procedure Laterality Date  . Cardiovascular stress test  2008    Stress nuclear study  . Back surgery  1996  . Laparoscopic cholecystectomy  1990s  . Total abdominal hysterectomy  1999  . Laminectomy  1995    L4-L5  . Carpal tunnel release  1994  . Esophagogastroduodenoscopy  2008    Barrett's  without dysplasia. esphagus dilated. antral erosions, h.pylori serologies negative.  . Colonoscopy  11/2011    Dr. Darrick Penna: Internal hemorrhoids, mild diverticulosis. Random colon biopsies negative.  . Givens capsule study  12/07/2011    Proximal small bowel, rare AVM. Distal small bowel, multiple ulcers noted  . Esophagogastroduodenoscopy  11/2011    Dr. Darrick Penna: Barrett's esophagus, mild gastritis, diverticulum in the second portion of the duodenum repeat EGD 3 years. Small bowel biopsies negative. Gastric biopsy show reactive gastropathy but no H. pylori. Esophageal biopsies consistent with GERD. Next EGD 11/2014    . Givens capsule study N/A 09/27/2013    Distal small bowel ulcers extending to TI.  Marland Kitchen Givens capsule study N/A 10/10/2013    Procedure: GIVENS CAPSULE STUDY;  Surgeon: West Bali, MD;  Location: [facility];  Service: Endoscopy;  Laterality: N/A;  7:30  . Colonoscopy N/A 11/08/2013    SLF: Normal mucosa in the terminal ileum/The colon IS redundant/  Moderate diverticulosis throughout the entire colon. ileum bx benign. colon bx benign  . Biopsy N/A 11/08/2013    Procedure: BIOPSY  / Tissue sampling / ulcers present in small intestine;  Surgeon: West Bali, MD;  Location: [facility];  Service: Endoscopy;  Laterality: N/A;  . Cardiac catheterization  2002  . Right heart catheterization N/A 01/10/2014    Procedure: RIGHT HEART CATH;  Surgeon: Lesleigh Noe, MD;  Location: [facility] CATH LAB;  Service: Cardiovascular;  Laterality: N/A;  . Left heart catheterization with coronary angiogram  01/10/2014    Procedure: LEFT HEART CATHETERIZATION WITH CORONARY ANGIOGRAM;  Surgeon: Lesleigh Noe, MD;  Location: [facility] CATH LAB;  Service: Cardiovascular;;  . Esophagogastroduodenoscopy N/A 11/21/2014    ZOX:WRUE non-erosive gastritis/irregular z-line  . Cardiac catheterization N/A 01/26/2015    Procedure: Right Heart Cath;  Surgeon: Laurey Morale, MD;  Location: [facility] INVASIVE CV LAB;  Service: Cardiovascular;  Laterality: N/A;  . Cardioversion N/A 03/06/2015    Procedure: CARDIOVERSION;  Surgeon: Laurey Morale, MD;  Location: [facility] ENDOSCOPY;  Service: Cardiovascular;  Laterality: N/A;     reports that she quit smoking about 32 years ago. Her smoking use included Cigarettes. She started smoking about 49 years ago. She has a 3.75 pack-year smoking history. She has never used smokeless tobacco. She reports that she does not drink alcohol or use illicit drugs.  Allergies  Allergen Reactions  . Ibuprofen Other (See Comments)    Upsets her stomach and causes abdominal pain  . Codeine Nausea And Vomiting and  Other (See Comments)    HALLUCINATIONS  . Celexa [Citalopram Hydrobromide] Other (See Comments)    Dyskinesia  . Reglan [Metoclopramide] Other (See Comments)    DYSKINESIA    Family History  Problem Relation Age of Onset  . Hypertension Mother   . Alzheimer's disease Mother   . Stroke Mother   . Heart attack Mother   . Heart disease Neg Hx   . Hypertension Other   . Colon cancer Neg Hx   . Breast cancer Sister     Prior to Admission medications   Medication Sig Start Date End Date Taking? Authorizing Provider  acetaminophen (TYLENOL) 500 MG tablet Take 500 mg by mouth every 6 (six) hours as needed for moderate pain or headache.    Historical Provider, MD  amiodarone (PACERONE) 200 MG tablet TAKE ONE TABLET BY MOUTH DAILY. 10/20/15   Laurey Morale, MD  apixaban (ELIQUIS) 5 MG TABS tablet Take 5 mg by  mouth 2 (two) times daily.    Historical Provider, MD  dicyclomine (BENTYL) 10 MG capsule 1-2 capsules before meals as needed for abdominal cramping and loose stool 12/07/15   Nira Retort, NP  DULoxetine (CYMBALTA) 30 MG capsule Take 30 mg by mouth 2 (two) times daily.    Historical Provider, MD  famotidine (PEPCID) 20 MG tablet Take 20 mg by mouth daily as needed for heartburn or indigestion.     Historical Provider, MD  furosemide (LASIX) 20 MG tablet Take 60 mg by mouth 2 (two) times daily.     Historical Provider, MD  gabapentin (NEURONTIN) 300 MG capsule Take 300 mg by mouth 3 (three) times daily.    Historical Provider, MD  glipiZIDE (GLUCOTROL) 10 MG tablet Take 10 mg by mouth 2 (two) times daily before a meal.     Historical Provider, MD  HYDROcodone-acetaminophen (NORCO/VICODIN) 5-325 MG tablet Take 1 tablet by mouth 2 (two) times daily.    Historical Provider, MD  Insulin Glargine (LANTUS SOLOSTAR) 100 UNIT/ML Solostar Pen Inject 20-50 Units into the skin at bedtime as needed. Patient takes 20 units every night at bedtime if over 178.  Takes 50 units if her blood sugar is above  300.    Historical Provider, MD  levETIRAcetam (KEPPRA) 750 MG tablet Take 750 mg by mouth 2 (two) times daily.    Historical Provider, MD  levothyroxine (SYNTHROID, LEVOTHROID) 137 MCG tablet Take 137 mcg by mouth daily. 10/14/14   Historical Provider, MD  LORazepam (ATIVAN) 1 MG tablet Take 1 mg by mouth 3 (three) times daily as needed for anxiety or sleep. For anxiety    Historical Provider, MD  magnesium oxide (MAG-OX) 400 MG tablet Take 400 mg by mouth daily.     Historical Provider, MD  metaxalone (SKELAXIN) 800 MG tablet Take 800 mg by mouth 3 (three) times daily.  10/04/14   Historical Provider, MD  metFORMIN (GLUCOPHAGE) 500 MG tablet Take 1,000 mg by mouth 2 (two) times daily with a meal.     Historical Provider, MD  Multiple Vitamin (MULTIVITAMIN WITH MINERALS) TABS tablet Take 1 tablet by mouth daily.    Historical Provider, MD  nitroGLYCERIN (NITROSTAT) 0.4 MG SL tablet Place 0.4 mg under the tongue every 5 (five) minutes as needed for chest pain. Reported on 07/08/2015    Historical Provider, MD  ondansetron (ZOFRAN) 4 MG tablet Take 1 tablet (4 mg total) by mouth 4 (four) times daily -  before meals and at bedtime. As needed for nausea. 12/07/15   Nira Retort, NP  pantoprazole (PROTONIX) 40 MG tablet Take 1 tablet (40 mg total) by mouth 2 (two) times daily before a meal. 12/07/15   Nira Retort, NP  POLY-IRON 150 150 MG capsule TAKE 1 CAPSULE BY MOUTH 2 TIMES DAILY. 06/02/15   Ellouise Newer, PA-C  potassium chloride (K-DUR) 10 MEQ tablet Take 2 tablets (20 mEq total) by mouth 2 (two) times daily. 10/20/15   Graciella Freer, PA-C  vitamin B-12 (CYANOCOBALAMIN) 500 MCG tablet Take 500 mcg by mouth daily.    Historical Provider, MD    Physical Exam: Filed Vitals:   12/26/15 0030 12/26/15 0100 12/26/15 0130 12/26/15 0200  BP: 94/62 114/70 110/59 124/65  Pulse: 68 65 61 67  Temp:      TempSrc:      Resp:   12 21  SpO2: 93% 100% 100% 96%      Constitutional: NAD, calm,  comfortable Filed  Vitals:   12/26/15 0030 12/26/15 0100 12/26/15 0130 12/26/15 0200  BP: 94/62 114/70 110/59 124/65  Pulse: 68 65 61 67  Temp:      TempSrc:      Resp:   12 21  SpO2: 93% 100% 100% 96%   Eyes: PERRL, lids and conjunctivae normal ENMT: Mucous membranes are moist. Posterior pharynx clear of any exudate or lesions.Normal dentition.  Neck: normal, supple, no masses, no thyromegaly Respiratory: clear to auscultation bilaterally, no wheezing, no crackles. Normal respiratory effort. No accessory muscle use.  Cardiovascular: Regular rate and rhythm, no murmurs / rubs / gallops. No extremity edema. 2+ pedal pulses. No carotid bruits.  Abdomen: no tenderness, no masses palpated. No hepatosplenomegaly. Bowel sounds positive.  Musculoskeletal: no clubbing / cyanosis. No joint deformity upper and lower extremities. Good ROM, no contractures. Normal muscle tone.  Skin: no rashes, lesions, ulcers. No induration Neurologic: CN 2-12 grossly intact. Sensation intact, DTR normal. Strength 5/5 in all 4.  Psychiatric: Normal judgment and insight. Alert and oriented x 3. Normal mood.   Labs on Admission: I have personally reviewed following labs and imaging studies  CBC:  Recent Labs Lab 12/25/15 2118 12/26/15 0111  WBC 14.3* 12.1*  NEUTROABS 10.8* 8.4*  HGB 14.6 13.4  HCT 44.4 39.7  MCV 95.9 95.4  PLT 228 191   Basic Metabolic Panel:  Recent Labs Lab 12/25/15 2118 12/26/15 0111  NA 137 137  K 3.9 3.7  CL 102 104  CO2 25 25  GLUCOSE 223* 178*  BUN 24* 25*  CREATININE 1.57* 1.61*  CALCIUM 8.8* 8.4*   GFR: Estimated Creatinine Clearance: 41.9 mL/min (by C-G formula based on Cr of 1.61). Liver Function Tests:  Recent Labs Lab 12/26/15 0111  AST 23  ALT 18  ALKPHOS 73  BILITOT 0.7  PROT 6.7  ALBUMIN 3.6   Cardiac Enzymes:  Recent Labs Lab 12/25/15 2118 12/26/15 0111  TROPONINI 0.04* 0.04*   BNP (last 3 results)  Recent Labs  04/21/15 1548  PROBNP  470.0*   Urine analysis:    Component Value Date/Time   COLORURINE YELLOW 02/05/2015 1700   APPEARANCEUR CLEAR 02/05/2015 1700   LABSPEC 1.015 02/05/2015 1700   PHURINE 6.0 02/05/2015 1700   GLUCOSEU NEGATIVE 02/05/2015 1700   HGBUR TRACE* 02/05/2015 1700   BILIRUBINUR NEGATIVE 02/05/2015 1700   KETONESUR TRACE* 02/05/2015 1700   PROTEINUR NEGATIVE 02/05/2015 1700   UROBILINOGEN 0.2 02/05/2015 1700   NITRITE NEGATIVE 02/05/2015 1700   LEUKOCYTESUR SMALL* 02/05/2015 1700   Radiological Exams on Admission: Ct Head Wo Contrast  12/25/2015  CLINICAL DATA:  65 year old female with fall EXAM: CT HEAD WITHOUT CONTRAST CT CERVICAL SPINE WITHOUT CONTRAST TECHNIQUE: Multidetector CT imaging of the head and cervical spine was performed following the standard protocol without intravenous contrast. Multiplanar CT image reconstructions of the cervical spine were also generated. COMPARISON:  Head CT dated 04/11/2014 FINDINGS: CT HEAD FINDINGS The ventricles and sulci are appropriate size for patient's age. Minimal periventricular and deep white matter chronic microvascular ischemic changes noted. There is no acute intracranial hemorrhage. No mass effect midline shift noted. The visualized paranasal sinuses and mastoid air cells are clear. The calvarium is intact. CT CERVICAL SPINE FINDINGS There is no acute fracture or subluxation of the cervical spine.There is osteopenia with multilevel degenerative changes of the spine in.The odontoid and spinous processes are intact.There is normal anatomic alignment of the C1-C2 lateral masses. The visualized soft tissues appear unremarkable. IMPRESSION: No acute intracranial hemorrhage. No  acute/traumatic cervical spine pathology. Electronically Signed   By: Elgie Collard M.D.   On: 12/25/2015 22:23   Ct Cervical Spine Wo Contrast  12/25/2015  CLINICAL DATA:  65 year old female with fall EXAM: CT HEAD WITHOUT CONTRAST CT CERVICAL SPINE WITHOUT CONTRAST TECHNIQUE:  Multidetector CT imaging of the head and cervical spine was performed following the standard protocol without intravenous contrast. Multiplanar CT image reconstructions of the cervical spine were also generated. COMPARISON:  Head CT dated 04/11/2014 FINDINGS: CT HEAD FINDINGS The ventricles and sulci are appropriate size for patient's age. Minimal periventricular and deep white matter chronic microvascular ischemic changes noted. There is no acute intracranial hemorrhage. No mass effect midline shift noted. The visualized paranasal sinuses and mastoid air cells are clear. The calvarium is intact. CT CERVICAL SPINE FINDINGS There is no acute fracture or subluxation of the cervical spine.There is osteopenia with multilevel degenerative changes of the spine in.The odontoid and spinous processes are intact.There is normal anatomic alignment of the C1-C2 lateral masses. The visualized soft tissues appear unremarkable. IMPRESSION: No acute intracranial hemorrhage. No acute/traumatic cervical spine pathology. Electronically Signed   By: Elgie Collard M.D.   On: 12/25/2015 22:23   Dg Shoulder Left  12/25/2015  CLINICAL DATA:  LEFT shoulder pain, got dizzy and passed out while walking to the bathroom, fall, history diabetes mellitus, hypertension, CHF EXAM: LEFT SHOULDER - 2+ VIEW COMPARISON:  07/11/2013 FINDINGS: Marked osseous demineralization. AC joint alignment normal. No acute fracture, dislocation, or bone destruction. Visualized LEFT ribs grossly intact. Subsegmental atelectasis lower LEFT lung. IMPRESSION: Marked osseous demineralization. No definite acute bony abnormalities. Electronically Signed   By: Ulyses Southward M.D.   On: 12/25/2015 21:17    EKG: Independently reviewed. SR  Assessment/Plan Principal Problem:   Syncope, near Active Problems:   Hyperlipidemia   Essential hypertension   Dementia   DM type 2 (diabetes mellitus, type 2) (HCC)   Chronic diastolic CHF (congestive heart failure)  (HCC)   Fall at home  1. Near syncope with prior fall at home, likely due to mild dehydration and Dilaudid that she received earlier today. Blod pressures appear to be stable currently. Earlier CT c-spine and head were negative for any acute abnormalities. Troponin minimally elevated. Will admit and monitor.  2. DM type 2. Will hold metformin. Will give reduced lantus and start on SSI 3. Paroxysmal Afib. Rate controlled with amiodarone. EKG shows that patient is currently in SR. Continue Eliquis. 4. Chronic and diastolic CHF. Appears compensated at this point. Past echo shows an EF of 55-60% about a year ago.  Will order update ECHO.  5. GERD. Continue PPI. 6. HLD. Continue statins. 7. Essential HTN. Blood pressure appears to be stable. Continue outpatient regimen.  8. Hypothyroidism. Continue synthroid. 9. OSA on CPAP. 10. Dementia. Appears at baseline.   DVT prophylaxis: Lovenox. Code Status: Full Family Communication: discussed with patient and daughter present at bedside. Disposition Plan: none Admission status: admit as observation.  Houston Siren, MD FACP [facility]   If 7PM-7AM, please contact night-coverage [email or web address] Password [email or web address] 12/26/2015, 2:12 AM   By signing my name below, I, Adron Bene, attest that this documentation has been prepared under the direction and in the presence of Houston Siren, MD. Electronically Signed: Adron Bene, Scribe 12/26/2015 2:10am

## 2015-12-26 NOTE — ED Provider Notes (Signed)
CSN: [id number]     Arrival date & time 12/25/15  2357 History  By signing my name below, I, Ronney Lion, attest that this documentation has been prepared under the direction and in the presence of Devoria Albe, MD at 00:06 AM. Electronically Signed: Ronney Lion, ED Scribe. 12/26/2015. 1:10 AM.    Chief Complaint  Patient presents with  . Dizziness   The history is provided by the patient. No language interpreter was used.   HPI Comments: [name] is a 65 y.o. female with a history of chest pain, chronic diastolic CHF, paroxysmal atrial fibrillation, chronic anticoagulation (Eliquis), DM type 2, syncope, chronic LBP, obesity, OSA on CPAP, and seizures, who presents to the Emergency Department complaining of a pre-syncopal episode that occurred PTA. Per chart review, patient was seen at the ED about 6 hours ago today for left shoulder pain s/p fall due to a syncopal event vs. Partial seizure(?). She had a left shoulder XR that was negative. Patient states she was at a sink washing her hands when she turned around and states she fell down immediately. She denies any prodromal symptoms such as dizziness or lightheadedness prior to falling. Patient was given a Dilaudid 0.5 mg injection before being discharged home She was also found to have mild dehydration and was given 1 L of fluid.  Patient's daughter states patient appeared mildly weak after discharge.  She states in the car her mother seemed "loopy" but she was awake and talking. After driving home which is about a 10 minute drive she attempted to get her out of the car, she states patient stood up and her eyes appeared to rollbackward. Patient had slurred speech, and she was unable to follow commands. Her daughter states she appeared "on the verge of going limp, but stiff at the same time." She had then moved patient into a chair when she returned to baseline. Patient states she was aware of her daughter talking to to her when she had gotten out of the  car, her daughter sounded "far off." Patient states she felt like she was going to fall down when her daughter slid the chair underneath her. Patient states she presently feel sluggish. Patient states her blood pressure normally runs 110/70. She states now she just feels sluggish and tired.  PCP: Dr. [name]  Past Medical History  Diagnosis Date  . Chest pain     Negative cardiac catheterization in 2002; negative stress nuclear study in 2008  . Chronic diastolic CHF (congestive heart failure) (HCC)     a. EF predominantly normal during prior echoes but was 40% during 10/2014 echo. b. Most recent 01/2015 EF normal, 55-60%.  . Paroxysmal atrial fibrillation (HCC)   . Chronic anticoagulation   . Diabetes mellitus, type 2 (HCC)     Insulin therapy; exacerbated by prednisone  . Syncope     a. Admitted 05/2009; magnetic resonance imagin/ MRA - negative; etiology thought to be orthostasis secondary to drugs and dehydration. b. Syncope 02/2015 also felt 2/2 dehydration.  Marland Kitchen GERD (gastroesophageal reflux disease)   . IBS (irritable bowel syndrome)   . Hiatal hernia   . Gastroparesis     99% retention 05/2008 on GES  . Hyperlipidemia   . Hypertension   . Hypothyroid   . Chronic LBP     Surgical intervention in 1996  . Obesity   . OSA on CPAP   . Anemia     H/H of 10/30 with a normal MCV in 12/09  .  Barrett's esophagus     Diagnosed 1995. Last EGD 2016-NO BARRETT'S.   . Seizures (HCC)   . Pulmonary hypertension (HCC) 01/2015    a. Predominantly pulmonary venous hypertension but may be component of PAH.   Past Surgical History  Procedure Laterality Date  . Cardiovascular stress test  2008    Stress nuclear study  . Back surgery  1996  . Laparoscopic cholecystectomy  1990s  . Total abdominal hysterectomy  1999  . Laminectomy  1995    L4-L5  . Carpal tunnel release  1994  . Esophagogastroduodenoscopy  2008    Barrett's without dysplasia. esphagus dilated. antral erosions, h.pylori  serologies negative.  . Colonoscopy  11/2011    Dr. Darrick Penna: Internal hemorrhoids, mild diverticulosis. Random colon biopsies negative.  . Givens capsule study  12/07/2011    Proximal small bowel, rare AVM. Distal small bowel, multiple ulcers noted  . Esophagogastroduodenoscopy  11/2011    Dr. Darrick Penna: Barrett's esophagus, mild gastritis, diverticulum in the second portion of the duodenum repeat EGD 3 years. Small bowel biopsies negative. Gastric biopsy show reactive gastropathy but no H. pylori. Esophageal biopsies consistent with GERD. Next EGD 11/2014  . Givens capsule study N/A 09/27/2013    Distal small bowel ulcers extending to TI.  Marland Kitchen Givens capsule study N/A 10/10/2013    Procedure: GIVENS CAPSULE STUDY;  Surgeon: West Bali, MD;  Location: [facility];  Service: Endoscopy;  Laterality: N/A;  7:30  . Colonoscopy N/A 11/08/2013    SLF: Normal mucosa in the terminal ileum/The colon IS redundant/  Moderate diverticulosis throughout the entire colon. ileum bx benign. colon bx benign  . Biopsy N/A 11/08/2013    Procedure: BIOPSY  / Tissue sampling / ulcers present in small intestine;  Surgeon: West Bali, MD;  Location: [facility];  Service: Endoscopy;  Laterality: N/A;  . Cardiac catheterization  2002  . Right heart catheterization N/A 01/10/2014    Procedure: RIGHT HEART CATH;  Surgeon: Lesleigh Noe, MD;  Location: [facility] CATH LAB;  Service: Cardiovascular;  Laterality: N/A;  . Left heart catheterization with coronary angiogram  01/10/2014    Procedure: LEFT HEART CATHETERIZATION WITH CORONARY ANGIOGRAM;  Surgeon: Lesleigh Noe, MD;  Location: [facility] CATH LAB;  Service: Cardiovascular;;  . Esophagogastroduodenoscopy N/A 11/21/2014    ZOX:WRUE non-erosive gastritis/irregular z-line  . Cardiac catheterization N/A 01/26/2015    Procedure: Right Heart Cath;  Surgeon: Laurey Morale, MD;  Location: [facility] INVASIVE CV LAB;  Service: Cardiovascular;  Laterality: N/A;  . Cardioversion N/A 03/06/2015     Procedure: CARDIOVERSION;  Surgeon: Laurey Morale, MD;  Location: [facility] ENDOSCOPY;  Service: Cardiovascular;  Laterality: N/A;   Family History  Problem Relation Age of Onset  . Hypertension Mother   . Alzheimer's disease Mother   . Stroke Mother   . Heart attack Mother   . Heart disease Neg Hx   . Hypertension Other   . Colon cancer Neg Hx   . Breast cancer Sister    Social History  Substance Use Topics  . Smoking status: Former Smoker -- 0.25 packs/day for 15 years    Types: Cigarettes    Start date: 02/26/1966    Quit date: 07/01/1983  . Smokeless tobacco: Never Used     Comment: quit in 1984  . Alcohol Use: No     Comment: last etoh one year ago, never frequent   Lives at home Lives with spouse  OB History  Gravida Para Term Preterm AB TAB SAB Ectopic Multiple Living   Review of Systems  Musculoskeletal: Positive for arthralgias.  Neurological: Positive for speech difficulty and weakness.  All other systems reviewed and are negative.     Allergies  Ibuprofen; Codeine; Celexa; and Reglan  Home Medications   Prior to Admission medications   Medication Sig Start Date End Date Taking? Authorizing Provider  acetaminophen (TYLENOL) 500 MG tablet Take 500 mg by mouth every 6 (six) hours as needed for moderate pain or headache.    Historical Provider, MD  amiodarone (PACERONE) 200 MG tablet TAKE ONE TABLET BY MOUTH DAILY. 10/20/15   Laurey Morale, MD  apixaban (ELIQUIS) 5 MG TABS tablet Take 5 mg by mouth 2 (two) times daily.    Historical Provider, MD  dicyclomine (BENTYL) 10 MG capsule 1-2 capsules before meals as needed for abdominal cramping and loose stool 12/07/15   Nira Retort, NP  DULoxetine (CYMBALTA) 30 MG capsule Take 30 mg by mouth 2 (two) times daily.    Historical Provider, MD  famotidine (PEPCID) 20 MG tablet Take 20 mg by mouth daily as needed for heartburn or indigestion.     Historical Provider, MD  furosemide (LASIX) 20 MG tablet  Take 60 mg by mouth 2 (two) times daily.     Historical Provider, MD  gabapentin (NEURONTIN) 300 MG capsule Take 300 mg by mouth 3 (three) times daily.    Historical Provider, MD  glipiZIDE (GLUCOTROL) 10 MG tablet Take 10 mg by mouth 2 (two) times daily before a meal.     Historical Provider, MD  HYDROcodone-acetaminophen (NORCO/VICODIN) 5-325 MG tablet Take 1 tablet by mouth 2 (two) times daily.    Historical Provider, MD  Insulin Glargine (LANTUS SOLOSTAR) 100 UNIT/ML Solostar Pen Inject 20-50 Units into the skin at bedtime as needed. Patient takes 20 units every night at bedtime if over 178.  Takes 50 units if her blood sugar is above 300.    Historical Provider, MD  levETIRAcetam (KEPPRA) 750 MG tablet Take 750 mg by mouth 2 (two) times daily.    Historical Provider, MD  levothyroxine (SYNTHROID, LEVOTHROID) 137 MCG tablet Take 137 mcg by mouth daily. 10/14/14   Historical Provider, MD  LORazepam (ATIVAN) 1 MG tablet Take 1 mg by mouth 3 (three) times daily as needed for anxiety or sleep. For anxiety    Historical Provider, MD  magnesium oxide (MAG-OX) 400 MG tablet Take 400 mg by mouth daily.     Historical Provider, MD  metaxalone (SKELAXIN) 800 MG tablet Take 800 mg by mouth 3 (three) times daily.  10/04/14   Historical Provider, MD  metFORMIN (GLUCOPHAGE) 500 MG tablet Take 1,000 mg by mouth 2 (two) times daily with a meal.     Historical Provider, MD  Multiple Vitamin (MULTIVITAMIN WITH MINERALS) TABS tablet Take 1 tablet by mouth daily.    Historical Provider, MD  nitroGLYCERIN (NITROSTAT) 0.4 MG SL tablet Place 0.4 mg under the tongue every 5 (five) minutes as needed for chest pain. Reported on 07/08/2015    Historical Provider, MD  ondansetron (ZOFRAN) 4 MG tablet Take 1 tablet (4 mg total) by mouth 4 (four) times daily -  before meals and at bedtime. As needed for nausea. 12/07/15   Nira Retort, NP  pantoprazole (PROTONIX) 40 MG tablet Take 1 tablet (40 mg total) by mouth 2 (two) times daily  before a meal. 12/07/15   Nira Retort, NP  POLY-IRON 150 150 MG capsule TAKE 1 CAPSULE BY MOUTH 2 TIMES DAILY. 06/02/15   Ellouise Newer, PA-C  potassium chloride (K-DUR) 10 MEQ tablet Take 2 tablets (20 mEq total) by mouth 2 (two) times daily. 10/20/15   Graciella Freer, PA-C  vitamin B-12 (CYANOCOBALAMIN) 500 MCG tablet Take 500 mcg by mouth daily.    Historical Provider, MD    ED Triage Vitals  Enc Vitals Group     BP 12/26/15 0009 94/62 mmHg     Pulse Rate 12/26/15 0009 68     Resp 12/26/15 0009 16     Temp 12/26/15 0009 98.3 F (36.8 C)     Temp Source 12/26/15 0009 Oral     SpO2 12/26/15 0009 97 %     Weight 12/26/15 0327 221 lb 12.5 oz (100.6 kg)     Height --      Head Cir --      Peak Flow --      Pain Score 12/26/15 0010 0     Pain Loc --      Pain Edu? --      Excl. in GC? --      Vital signs normal Except for hypotension  Physical Exam  Constitutional: She is oriented to person, place, and time. She appears well-developed and well-nourished.  Non-toxic appearance. She does not appear ill. No distress.  HENT:  Head: Normocephalic and atraumatic.  Right Ear: External ear normal.  Left Ear: External ear normal.  Nose: No mucosal edema or rhinorrhea.  Mouth/Throat: Mucous membranes are dry. No dental abscesses or uvula swelling.  Tongue is dry.   Eyes: Conjunctivae and EOM are normal. Pupils are equal, round, and reactive to light.  Neck: Normal range of motion and full passive range of motion without pain. Neck supple.  Cardiovascular: Normal rate, regular rhythm and normal heart sounds.  Exam reveals no gallop and no friction rub.   No murmur heard. Pulmonary/Chest: Effort normal and breath sounds normal. No respiratory distress. She has no wheezes. She has no rhonchi. She has no rales. She exhibits no tenderness and no crepitus.    Patient has tenderness of her left upper lateral chest wall.  Abdominal: Soft. Normal appearance and bowel sounds are normal.  She exhibits no distension. There is no tenderness. There is no rebound and no guarding.  Musculoskeletal: Normal range of motion. She exhibits tenderness. She exhibits no edema.  Moves all extremities well. Wearing a sling on left arm. Tenderness to left upper lateral chest.   Neurological: She is alert and oriented to person, place, and time. She has normal strength. No cranial nerve deficit.  Skin: Skin is warm, dry and intact. No rash noted. No erythema. No pallor.  Psychiatric: She has a normal mood and affect. Her speech is normal and behavior is normal. Her mood appears not anxious.  Nursing note and vitals reviewed.   ED Course  Procedures (including critical care time)  Medications  sodium chloride 0.9 % bolus 1,000 mL (0 mLs Intravenous Stopped 12/26/15 0247)     DIAGNOSTIC STUDIES: Oxygen Saturation is 97% on RA, normal by my interpretation.    COORDINATION OF CARE: 12:48 AM - Discussed treatment plan with pt at bedside. Pt verbalized understanding and agreed to plan.  Patient was given IV fluids because her blood pressure was in the 90s during my exam.  Recheck at 2 AM patient's blood pressure is  now 124/65. She states she feels much improved. I asked her if there is any reason she could've been dehydrated today and she states she has been having some diarrhea and she did take Lasix today. We discussed she probably needs to be admitted for some more fluid hydration to improve her renal function and she is agreeable. I think her hypotension was most likely the combination of dehydration and the pain medication she was given.  2:22 AM Dr. Conley Rolls, hospitalist is going to admit to telemetry, observation  Labs Review Results for orders placed or performed during the hospital encounter of 12/26/15  Comprehensive metabolic panel  Result Value Ref Range   Sodium 137 135 - 145 mmol/L   Potassium 3.7 3.5 - 5.1 mmol/L   Chloride 104 101 - 111 mmol/L   CO2 25 22 - 32 mmol/L   Glucose,  Bld 178 (H) 65 - 99 mg/dL   BUN 25 (H) 6 - 20 mg/dL   Creatinine, Ser 1.61 (H) 0.44 - 1.00 mg/dL   Calcium 8.4 (L) 8.9 - 10.3 mg/dL   Total Protein 6.7 6.5 - 8.1 g/dL   Albumin 3.6 3.5 - 5.0 g/dL   AST 23 15 - 41 U/L   ALT 18 14 - 54 U/L   Alkaline Phosphatase 73 38 - 126 U/L   Total Bilirubin 0.7 0.3 - 1.2 mg/dL   GFR calc non Af Amer 32 (L) >60 mL/min   GFR calc Af Amer 37 (L) >60 mL/min   Anion gap 8 5 - 15  CBC with Differential  Result Value Ref Range   WBC 12.1 (H) 4.0 - 10.5 K/uL   RBC 4.16 3.87 - 5.11 MIL/uL   Hemoglobin 13.4 12.0 - 15.0 g/dL   HCT 09.6 04.5 - 40.9 %   MCV 95.4 78.0 - 100.0 fL   MCH 32.2 26.0 - 34.0 pg   MCHC 33.8 30.0 - 36.0 g/dL   RDW 81.1 91.4 - 78.2 %   Platelets 191 150 - 400 K/uL   Neutrophils Relative % 70 %   Neutro Abs 8.4 (H) 1.7 - 7.7 K/uL   Lymphocytes Relative 20 %   Lymphs Abs 2.4 0.7 - 4.0 K/uL   Monocytes Relative 10 %   Monocytes Absolute 1.2 (H) 0.1 - 1.0 K/uL   Eosinophils Relative 0 %   Eosinophils Absolute 0.1 0.0 - 0.7 K/uL   Basophils Relative 0 %   Basophils Absolute 0.0 0.0 - 0.1 K/uL  Troponin I  Result Value Ref Range   Troponin I 0.04 (H) <0.031 ng/mL  Troponin I  Result Value Ref Range   Troponin I 0.05 (H) <0.031 ng/mL  Basic metabolic panel  Result Value Ref Range   Sodium 138 135 - 145 mmol/L   Potassium 3.5 3.5 - 5.1 mmol/L   Chloride 108 101 - 111 mmol/L   CO2 23 22 - 32 mmol/L   Glucose, Bld 173 (H) 65 - 99 mg/dL   BUN 24 (H) 6 - 20 mg/dL   Creatinine, Ser 9.56 (H) 0.44 - 1.00 mg/dL   Calcium 8.3 (L) 8.9 - 10.3 mg/dL   GFR calc non Af Amer 37 (L) >60 mL/min   GFR calc Af Amer 43 (L) >60 mL/min   Anion gap 7 5 - 15  CBC  Result Value Ref Range   WBC 9.8 4.0 - 10.5 K/uL   RBC 3.99 3.87 - 5.11 MIL/uL   Hemoglobin 12.7 12.0 - 15.0 g/dL   HCT 21.3 08.6 -  46.0 %   MCV 96.0 78.0 - 100.0 fL   MCH 31.8 26.0 - 34.0 pg   MCHC 33.2 30.0 - 36.0 g/dL   RDW 16.1 09.6 - 04.5 %   Platelets 194 150 - 400 K/uL     Laboratory interpretation all normal except Patient still has persistent new renal insufficiency since last blood work in September, her troponin is still mildly elevated from earlier today of 0.04    Imaging Review Ct Head Wo Contrast  12/25/2015  CLINICAL DATA:  65 year old female with fall EXAM: CT HEAD WITHOUT CONTRAST CT CERVICAL SPINE WITHOUT CONTRAST TECHNIQUE: Multidetector CT imaging of the head and cervical spine was performed following the standard protocol without intravenous contrast. Multiplanar CT image reconstructions of the cervical spine were also generated. COMPARISON:  Head CT dated 04/11/2014 FINDINGS: CT HEAD FINDINGS The ventricles and sulci are appropriate size for patient's age. Minimal periventricular and deep white matter chronic microvascular ischemic changes noted. There is no acute intracranial hemorrhage. No mass effect midline shift noted. The visualized paranasal sinuses and mastoid air cells are clear. The calvarium is intact. CT CERVICAL SPINE FINDINGS There is no acute fracture or subluxation of the cervical spine.There is osteopenia with multilevel degenerative changes of the spine in.The odontoid and spinous processes are intact.There is normal anatomic alignment of the C1-C2 lateral masses. The visualized soft tissues appear unremarkable. IMPRESSION: No acute intracranial hemorrhage. No acute/traumatic cervical spine pathology. Electronically Signed   By: Elgie Collard M.D.   On: 12/25/2015 22:23   Ct Cervical Spine Wo Contrast  12/25/2015  CLINICAL DATA:  65 year old female with fall EXAM: CT HEAD WITHOUT CONTRAST CT CERVICAL SPINE WITHOUT CONTRAST TECHNIQUE: Multidetector CT imaging of the head and cervical spine was performed following the standard protocol without intravenous contrast. Multiplanar CT image reconstructions of the cervical spine were also generated. COMPARISON:  Head CT dated 04/11/2014 FINDINGS: CT HEAD FINDINGS The ventricles and sulci are  appropriate size for patient's age. Minimal periventricular and deep white matter chronic microvascular ischemic changes noted. There is no acute intracranial hemorrhage. No mass effect midline shift noted. The visualized paranasal sinuses and mastoid air cells are clear. The calvarium is intact. CT CERVICAL SPINE FINDINGS There is no acute fracture or subluxation of the cervical spine.There is osteopenia with multilevel degenerative changes of the spine in.The odontoid and spinous processes are intact.There is normal anatomic alignment of the C1-C2 lateral masses. The visualized soft tissues appear unremarkable. IMPRESSION: No acute intracranial hemorrhage. No acute/traumatic cervical spine pathology. Electronically Signed   By: Elgie Collard M.D.   On: 12/25/2015 22:23   Dg Shoulder Left  12/25/2015  CLINICAL DATA:  LEFT shoulder pain, got dizzy and passed out while walking to the bathroom, fall, history diabetes mellitus, hypertension, CHF EXAM: LEFT SHOULDER - 2+ VIEW COMPARISON:  07/11/2013 FINDINGS: Marked osseous demineralization. AC joint alignment normal. No acute fracture, dislocation, or bone destruction. Visualized LEFT ribs grossly intact. Subsegmental atelectasis lower LEFT lung. IMPRESSION: Marked osseous demineralization. No definite acute bony abnormalities. Electronically Signed   By: Ulyses Southward M.D.   On: 12/25/2015 21:17   I have personally reviewed and evaluated these images and lab results as part of my medical decision-making.   EKG Interpretation None      ED ECG REPORT   Date: 12/26/2015  Rate: 62  Rhythm: normal sinus rhythm  QRS Axis: normal  Intervals: QT prolonged  ST/T Wave abnormalities: repolarization abnormality  Conduction Disutrbances:LVH, PRWP  Narrative Interpretation:  Old EKG Reviewed: unchanged from earlier this evening  I have personally reviewed the EKG tracing and agree with the computerized printout as noted.   MDM   Final diagnoses:   Near syncope  Hypotension, unspecified  Acute renal insufficiency    Plan admission   Devoria Albe, MD, FACEP   I personally performed the services described in this documentation, which was scribed in my presence. The recorded information has been reviewed and considered.  Devoria Albe, MD, Concha Pyo, MD 12/26/15 [phone]

## 2015-12-26 NOTE — ED Notes (Signed)
Pt discharged around an hour ago, daughter states she was given a medication about 10 mins prior to discharge. States patient was mildy weak and dizzy at discharge, after the 10-15 min drive home patient attempted to get out of the car and became more dizzy and weak and could no ambulate.

## 2015-12-26 NOTE — Progress Notes (Signed)
*  PRELIMINARY RESULTS* Echocardiogram 2D Echocardiogram has been performed.  [name] 12/26/2015, 9:33 AM

## 2015-12-26 NOTE — ED Notes (Signed)
Report called to [facility] on [address], all questions answered.

## 2015-12-26 NOTE — Progress Notes (Signed)
Patient seen and examined, database reviewed. Admitted earlier today after 2 falls with at least one of them being syncopal in nature. The second fall might have been attributed to IV Dilaudid received in ED. She is currently receiving syncopal workup including 2-D echo, carotid Dopplers which are pending, can potentially discharge home in 24 hours if workup is negative.  Peggye Pitt, MD [facility] Pager: [phone]

## 2015-12-27 DIAGNOSIS — R55 Syncope and collapse: Secondary | ICD-10-CM | POA: Diagnosis not present

## 2015-12-27 DIAGNOSIS — I959 Hypotension, unspecified: Secondary | ICD-10-CM | POA: Diagnosis not present

## 2015-12-27 LAB — BASIC METABOLIC PANEL
ANION GAP: 7 (ref 5–15)
BUN: 22 mg/dL — ABNORMAL HIGH (ref 6–20)
CALCIUM: 8.2 mg/dL — AB (ref 8.9–10.3)
CHLORIDE: 105 mmol/L (ref 101–111)
CO2: 23 mmol/L (ref 22–32)
Creatinine, Ser: 1 mg/dL (ref 0.44–1.00)
GFR calc non Af Amer: 57 mL/min — ABNORMAL LOW (ref >60)
Glucose, Bld: 234 mg/dL — ABNORMAL HIGH (ref 65–99)
POTASSIUM: 3.2 mmol/L — AB (ref 3.5–5.1)
Sodium: 135 mmol/L (ref 135–145)

## 2015-12-27 LAB — GLUCOSE, CAPILLARY
GLUCOSE-CAPILLARY: 130 mg/dL — AB (ref 65–99)
GLUCOSE-CAPILLARY: 170 mg/dL — AB (ref 65–99)
GLUCOSE-CAPILLARY: 153 mg/dL — AB (ref 65–99)
Glucose-Capillary: 202 mg/dL — ABNORMAL HIGH (ref 65–99)

## 2015-12-27 NOTE — Progress Notes (Signed)
Patient Demographics:    [name], is a 65 y.o. female, DOB - [birth year], WUJ:[id number]  Admit date - 12/26/2015   Admitting Physician Houston Siren, MD  Outpatient Primary MD for the patient is [name], MD  LOS -    Chief Complaint  Patient presents with  . Dizziness        Subjective:    [name] today has no fevers, no emesis,  No chest pain,  New c/o, discussed with patient's daughter by phone,   Assessment  & Plan :    Principal Problem:   Syncope, near Active Problems:   Hyperlipidemia   Essential hypertension   Dementia   DM type 2 (diabetes mellitus, type 2) (HCC)   Chronic diastolic CHF (congestive heart failure) (HCC)   Fall at home  1)Near syncope - stable,  likely due to mild dehydration and Dilaudid that she received in ED,  CT c-spine and head were negative for any acute abnormalities. Troponin minimally elevated, remains steady. Awaiting carotid artery doppler reports  2)DM type 2. - stable, c/n to hold metformin. Use Novolog/Humalog Sliding scale insulin with Accu-Cheks/Fingersticks as ordered   3)Paroxysmal Afib. Rate controlled with amiodarone.  currently in SR. Continue Eliquis.  4)Chronic and diastolic CHF. Appears compensated at this point. Past echo shows an EF of 55-60% about a year ago. Repeat Echo with preserved EF and no significant wall motion abnormalities  5)Essential HTN/Hypothyroid- stable, c/n home meds  Code Status : Full code   Disposition Plan  : home with Family in am   DVT Prophylaxis  :    SCDs   Lab Results  Component Value Date   PLT 194 12/26/2015    Inpatient Medications  Scheduled Meds: . amiodarone  200 mg Oral Daily  . apixaban  5 mg Oral BID  . DULoxetine  30 mg Oral BID  . gabapentin  300 mg Oral TID  . glipiZIDE  10 mg Oral BID AC  . insulin aspart  0-20 Units Subcutaneous TID WC  . insulin aspart  0-5 Units  Subcutaneous QHS  . insulin glargine  20 Units Subcutaneous QHS  . iron polysaccharides  150 mg Oral Daily  . levETIRAcetam  750 mg Oral BID  . levothyroxine  137 mcg Oral QAC breakfast  . multivitamin with minerals  1 tablet Oral Daily  . pantoprazole  40 mg Oral BID AC  . sodium chloride flush  3 mL Intravenous Q12H  . vitamin B-12  500 mcg Oral Daily   Continuous Infusions: . sodium chloride 50 mL/hr at 12/26/15 0500   PRN Meds:.acetaminophen, famotidine, ondansetron **OR** ondansetron (ZOFRAN) IV    Anti-infectives    None        Objective:   Filed Vitals:   12/26/15 1500 12/26/15 2205 12/27/15 0544 12/27/15 1500  BP: 118/57 106/60 117/66 110/53  Pulse: 71 64 73 55  Temp: 98.4 F (36.9 C) 98.6 F (37 C) 98 F (36.7 C) 98 F (36.7 C)  TempSrc: Oral Oral Oral Oral  Resp: 18 20 20 20   Height:      Weight:      SpO2: 94% 95% 95% 96%    Wt Readings from Last 3 Encounters:  12/26/15 100.6 kg (221  lb 12.5 oz)  12/25/15 104.327 kg (230 lb)  12/07/15 104.872 kg (231 lb 3.2 oz)     Intake/Output Summary (Last 24 hours) at 12/27/15 1635 Last data filed at 12/27/15 1243  Gross per 24 hour  Intake    723 ml  Output      0 ml  Net    723 ml     Physical Exam  Gen:- Awake Alert,   HEENT:- [place].AT, No sclera icterus Neck-Supple Neck,No JVD,.  Lungs-  CTAB  CV- S1, S2 normal, irregular Abd-  +ve B.Sounds, Abd Soft, No tenderness,    Extremity/Skin:- No  edema,       Data Review:   Micro Results No results found for this or any previous visit (from the past 240 hour(s)).  Radiology Reports Ct Head Wo Contrast  12/25/2015  CLINICAL DATA:  65 year old female with fall EXAM: CT HEAD WITHOUT CONTRAST CT CERVICAL SPINE WITHOUT CONTRAST TECHNIQUE: Multidetector CT imaging of the head and cervical spine was performed following the standard protocol without intravenous contrast. Multiplanar CT image reconstructions of the cervical spine were also generated.  COMPARISON:  Head CT dated 04/11/2014 FINDINGS: CT HEAD FINDINGS The ventricles and sulci are appropriate size for patient's age. Minimal periventricular and deep white matter chronic microvascular ischemic changes noted. There is no acute intracranial hemorrhage. No mass effect midline shift noted. The visualized paranasal sinuses and mastoid air cells are clear. The calvarium is intact. CT CERVICAL SPINE FINDINGS There is no acute fracture or subluxation of the cervical spine.There is osteopenia with multilevel degenerative changes of the spine in.The odontoid and spinous processes are intact.There is normal anatomic alignment of the C1-C2 lateral masses. The visualized soft tissues appear unremarkable. IMPRESSION: No acute intracranial hemorrhage. No acute/traumatic cervical spine pathology. Electronically Signed   By: Elgie Collard M.D.   On: 12/25/2015 22:23   Ct Cervical Spine Wo Contrast  12/25/2015  CLINICAL DATA:  65 year old female with fall EXAM: CT HEAD WITHOUT CONTRAST CT CERVICAL SPINE WITHOUT CONTRAST TECHNIQUE: Multidetector CT imaging of the head and cervical spine was performed following the standard protocol without intravenous contrast. Multiplanar CT image reconstructions of the cervical spine were also generated. COMPARISON:  Head CT dated 04/11/2014 FINDINGS: CT HEAD FINDINGS The ventricles and sulci are appropriate size for patient's age. Minimal periventricular and deep white matter chronic microvascular ischemic changes noted. There is no acute intracranial hemorrhage. No mass effect midline shift noted. The visualized paranasal sinuses and mastoid air cells are clear. The calvarium is intact. CT CERVICAL SPINE FINDINGS There is no acute fracture or subluxation of the cervical spine.There is osteopenia with multilevel degenerative changes of the spine in.The odontoid and spinous processes are intact.There is normal anatomic alignment of the C1-C2 lateral masses. The visualized soft  tissues appear unremarkable. IMPRESSION: No acute intracranial hemorrhage. No acute/traumatic cervical spine pathology. Electronically Signed   By: Elgie Collard M.D.   On: 12/25/2015 22:23   Dg Esophagus  12/11/2015  CLINICAL DATA:  Difficulty swallowing and choking. EXAM: ESOPHOGRAM / BARIUM SWALLOW / BARIUM TABLET STUDY TECHNIQUE: Combined double contrast and single contrast examination performed using effervescent crystals, thick barium liquid, and thin barium liquid. The patient was observed with fluoroscopy swallowing a 13 mm barium sulphate tablet. FLUOROSCOPY TIME:  Radiation Exposure Index (as provided by the fluoroscopic device): 40.7 mGy If the device does not provide the exposure index: Fluoroscopy Time:  2 minutes and 20 seconds Number of Acquired Images: COMPARISON:  None FINDINGS: Initial barium  swallows demonstrate normal pharyngeal motion with swallowing. No laryngeal penetration or aspiration. No upper esophageal webs, strictures or diverticuli. Nonspecific esophageal dysmotility with occasional disruption of the primary peristaltic wave and occasional tertiary contractions. No intrinsic or extrinsic lesions are identified. The mucosal folds appear normal. Small to moderate-sized sliding-type hiatal hernia with spontaneous and inducible GE reflux. The 13 mm barium pill passed into the stomach without difficulty. IMPRESSION: 1. Small to moderate-sized sliding-type hiatal hernia with spontaneous and inducible GE reflux. 2. No stricture or mass. 3. Nonspecific esophageal dysmotility. Electronically Signed   By: Rudie Meyer M.D.   On: 12/11/2015 10:42   Dg Shoulder Left  12/25/2015  CLINICAL DATA:  LEFT shoulder pain, got dizzy and passed out while walking to the bathroom, fall, history diabetes mellitus, hypertension, CHF EXAM: LEFT SHOULDER - 2+ VIEW COMPARISON:  07/11/2013 FINDINGS: Marked osseous demineralization. AC joint alignment normal. No acute fracture, dislocation, or bone  destruction. Visualized LEFT ribs grossly intact. Subsegmental atelectasis lower LEFT lung. IMPRESSION: Marked osseous demineralization. No definite acute bony abnormalities. Electronically Signed   By: Ulyses Southward M.D.   On: 12/25/2015 21:17     CBC  Recent Labs Lab 12/25/15 2118 12/26/15 0111 12/26/15 0455  WBC 14.3* 12.1* 9.8  HGB 14.6 13.4 12.7  HCT 44.4 39.7 38.3  PLT 228 191 194  MCV 95.9 95.4 96.0  MCH 31.5 32.2 31.8  MCHC 32.9 33.8 33.2  RDW 13.5 13.5 13.6  LYMPHSABS 2.2 2.4  --   MONOABS 1.1* 1.2*  --   EOSABS 0.1 0.1  --   BASOSABS 0.0 0.0  --     Chemistries   Recent Labs Lab 12/25/15 2118 12/26/15 0111 12/26/15 0455 12/27/15 0904  NA 137 137 138 135  K 3.9 3.7 3.5 3.2*  CL 102 104 108 105  CO2 GLUCOSE 223* 178* 173* 234*  BUN 24* 25* 24* 22*  CREATININE 1.57* 1.61* 1.44* 1.00  CALCIUM 8.8* 8.4* 8.3* 8.2*  AST  --  23  --   --   ALT  --  18  --   --   ALKPHOS  --  73  --   --   BILITOT  --  0.7  --   --    ------------------------------------------------------------------------------------------------------------------ No results for input(s): CHOL, HDL, LDLCALC, TRIG, CHOLHDL, LDLDIRECT in the last 72 hours.  Lab Results  Component Value Date   HGBA1C 7.4* 02/04/2015   ------------------------------------------------------------------------------------------------------------------  Recent Labs  12/26/15 0455  TSH 2.553   ------------------------------------------------------------------------------------------------------------------ No results for input(s): VITAMINB12, FOLATE, FERRITIN, TIBC, IRON, RETICCTPCT in the last 72 hours.  Coagulation profile No results for input(s): INR, PROTIME in the last 168 hours.  No results for input(s): DDIMER in the last 72 hours.  Cardiac Enzymes  Recent Labs Lab 12/26/15 0455 12/26/15 1025 12/26/15 1659  TROPONINI 0.05* 0.04* 0.04*    ------------------------------------------------------------------------------------------------------------------    Component Value Date/Time   BNP 265.7* 01/29/2015 0410   BNP 385.8* 09/08/2014 0914     Amyre Segundo M.D on 12/27/2015 at 4:35 PM  Between 7am to 7pm - Pager - [phone]  After 7pm go to [email or web address] - password [email or web address]  [facility] -  Office  [phone]  Dragon dictation system was used to create this note, attempts have been made to correct errors, however presence of uncorrected errors is not a reflection quality of care provided

## 2015-12-27 NOTE — Care Management Obs Status (Signed)
MEDICARE OBSERVATION STATUS NOTIFICATION   Patient Details  Name: [name] MRN: [id number] Date of Birth: 03-21-1951   Medicare Observation Status Notification Given:  Yes    Fuller Plan, RN 12/27/2015, 10:25 AM

## 2015-12-28 ENCOUNTER — Observation Stay (HOSPITAL_COMMUNITY): Payer: Medicare Other

## 2015-12-28 ENCOUNTER — Other Ambulatory Visit (HOSPITAL_COMMUNITY): Payer: Medicare Other

## 2015-12-28 DIAGNOSIS — I6523 Occlusion and stenosis of bilateral carotid arteries: Secondary | ICD-10-CM | POA: Diagnosis not present

## 2015-12-28 DIAGNOSIS — I959 Hypotension, unspecified: Secondary | ICD-10-CM | POA: Diagnosis not present

## 2015-12-28 DIAGNOSIS — R55 Syncope and collapse: Secondary | ICD-10-CM | POA: Diagnosis not present

## 2015-12-28 LAB — GLUCOSE, CAPILLARY
Glucose-Capillary: 185 mg/dL — ABNORMAL HIGH (ref 65–99)
Glucose-Capillary: 139 mg/dL — ABNORMAL HIGH (ref 65–99)

## 2015-12-28 MED ORDER — FUROSEMIDE 40 MG PO TABS: 40.0000 mg | ORAL_TABLET | Freq: Every day | ORAL | Status: DC

## 2015-12-28 MED ORDER — HYDROCODONE-ACETAMINOPHEN 5-325 MG PO TABS: 1.0000 | ORAL_TABLET | Freq: Four times a day (QID) | ORAL | Status: DC | PRN

## 2015-12-28 NOTE — Progress Notes (Signed)
IV lost for 3rd time during shift. Patient does not want to be stuck anymore if she is going home today. MD notified via text page.

## 2015-12-28 NOTE — Care Management Note (Signed)
Case Management Note  Patient Details  Name: [name] MRN: [id number] Date of Birth: Mar 14, 1951  Subjective/Objective:  Patient is from home, lives with husband and daughter. Daughter is at bedside during assessment. She currently walk with a cane, d/t "bad knee and dizziness" per patient. Her PCP is Dr. [name]. Her daughter drives her to appointments. She has had HH through [facility] in the past but is not active currently.  She reports no issues with obtaining medications.         Action/Plan: No CM needs identified.    Expected Discharge Date:        12/28/2015          Expected Discharge Plan:  Home/Self Care  In-House Referral:  NA  Discharge planning Services  CM Consult  Post Acute Care Choice:  NA Choice offered to:  NA  DME Arranged:    DME Agency:     HH Arranged:    HH Agency:     Status of Service:  Completed, signed off  If discussed at [employer] of Stay Meetings, dates discussed:    Additional Comments:  Briteny Fulghum, Chrystine Oiler, RN 12/28/2015, 9:30 AM

## 2015-12-28 NOTE — Progress Notes (Signed)
Inpatient Diabetes Program Recommendations  AACE/ADA: New Consensus Statement on Inpatient Glycemic Control (2015)  Target Ranges:  Prepandial:   less than 140 mg/dL      Peak postprandial:   less than 180 mg/dL (1-2 hours)      Critically ill patients:  140 - 180 mg/dL   Results for MYLIE, SPARBY (MRN [id number]) as of 12/28/2015 09:52  Ref. Range 12/27/2015 07:56 12/27/2015 11:49 12/27/2015 16:30 12/27/2015 20:38  Glucose-Capillary Latest Ref Range: 65-99 mg/dL 262 (H) 035 (H) 597 (H) 153 (H)   Results for CORIANNE, WERDER (MRN [id number]) as of 12/28/2015 09:52  Ref. Range 12/28/2015 08:04  Glucose-Capillary Latest Ref Range: 65-99 mg/dL 468 (H)    Admit with: Syncope  History: DM, CHF  Home DM Meds: Lantus 20-50 units QHS       Metformin 1000 mg bid       Glipizide 10 mg bid  Current Insulin Orders: Lantus 20 units QHS      Novolog Resistant Correction Scale/ SSI (0-20 units) TID AC + HS      Glipizide 10 mg bid     MD- Please consider increasing Lantus slightly to 24 units QHS (20% increase)   Fasting glucose elevated X 2 days now     --Will follow patient during hospitalization--  Ambrose Finland RN, MSN, CDE Diabetes Coordinator Inpatient Glycemic Control Team Team Pager: [phone] (8a-5p)

## 2015-12-28 NOTE — Discharge Summary (Signed)
[name], is a 65 y.o. female  DOB [birth year]  MRN [id number].  Admission date:  12/26/2015  Admitting Physician  Houston Siren, MD  Discharge Date:  12/28/2015   Primary MD  [name], MD  Recommendations for primary care physician for things to follow:   Maintain adequate hydration, follow-up with her regular doctor   Admission Diagnosis  Hypotension, unspecified [I95.9] Acute renal insufficiency [N28.9] Near syncope [R55]   Discharge Diagnosis  Hypotension, unspecified [I95.9] Acute renal insufficiency [N28.9] Near syncope [R55]    Principal Problem:   Syncope, near Active Problems:   Hyperlipidemia   Essential hypertension   Dementia   DM type 2 (diabetes mellitus, type 2) (HCC)   Chronic diastolic CHF (congestive heart failure) (HCC)   Fall at home      Past Medical History  Diagnosis Date  . Chest pain     Negative cardiac catheterization in 2002; negative stress nuclear study in 2008  . Chronic diastolic CHF (congestive heart failure) (HCC)     a. EF predominantly normal during prior echoes but was 40% during 10/2014 echo. b. Most recent 01/2015 EF normal, 55-60%.  . Paroxysmal atrial fibrillation (HCC)   . Chronic anticoagulation   . Diabetes mellitus, type 2 (HCC)     Insulin therapy; exacerbated by prednisone  . Syncope     a. Admitted 05/2009; magnetic resonance imagin/ MRA - negative; etiology thought to be orthostasis secondary to drugs and dehydration. b. Syncope 02/2015 also felt 2/2 dehydration.  Marland Kitchen GERD (gastroesophageal reflux disease)   . IBS (irritable bowel syndrome)   . Hiatal hernia   . Gastroparesis     99% retention 05/2008 on GES  . Hyperlipidemia   . Hypertension   . Hypothyroid   . Chronic LBP     Surgical intervention in 1996  . Obesity   . OSA on CPAP   . Anemia     H/H of 10/30 with a normal MCV in 12/09  . Barrett's esophagus     Diagnosed  1995. Last EGD 2016-NO BARRETT'S.   . Seizures (HCC)   . Pulmonary hypertension (HCC) 01/2015    a. Predominantly pulmonary venous hypertension but may be component of PAH.    Past Surgical History  Procedure Laterality Date  . Cardiovascular stress test  2008    Stress nuclear study  . Back surgery  1996  . Laparoscopic cholecystectomy  1990s  . Total abdominal hysterectomy  1999  . Laminectomy  1995    L4-L5  . Carpal tunnel release  1994  . Esophagogastroduodenoscopy  2008    Barrett's without dysplasia. esphagus dilated. antral erosions, h.pylori serologies negative.  . Colonoscopy  11/2011    Dr. Darrick Penna: Internal hemorrhoids, mild diverticulosis. Random colon biopsies negative.  . Givens capsule study  12/07/2011    Proximal small bowel, rare AVM. Distal small bowel, multiple ulcers noted  . Esophagogastroduodenoscopy  11/2011    Dr. Darrick Penna: Barrett's esophagus, mild gastritis, diverticulum in the second portion of the duodenum  repeat EGD 3 years. Small bowel biopsies negative. Gastric biopsy show reactive gastropathy but no H. pylori. Esophageal biopsies consistent with GERD. Next EGD 11/2014  . Givens capsule study N/A 09/27/2013    Distal small bowel ulcers extending to TI.  Marland Kitchen Givens capsule study N/A 10/10/2013    Procedure: GIVENS CAPSULE STUDY;  Surgeon: West Bali, MD;  Location: [facility];  Service: Endoscopy;  Laterality: N/A;  7:30  . Colonoscopy N/A 11/08/2013    SLF: Normal mucosa in the terminal ileum/The colon IS redundant/  Moderate diverticulosis throughout the entire colon. ileum bx benign. colon bx benign  . Biopsy N/A 11/08/2013    Procedure: BIOPSY  / Tissue sampling / ulcers present in small intestine;  Surgeon: West Bali, MD;  Location: [facility];  Service: Endoscopy;  Laterality: N/A;  . Cardiac catheterization  2002  . Right heart catheterization N/A 01/10/2014    Procedure: RIGHT HEART CATH;  Surgeon: Lesleigh Noe, MD;  Location: [facility] CATH LAB;   Service: Cardiovascular;  Laterality: N/A;  . Left heart catheterization with coronary angiogram  01/10/2014    Procedure: LEFT HEART CATHETERIZATION WITH CORONARY ANGIOGRAM;  Surgeon: Lesleigh Noe, MD;  Location: [facility] CATH LAB;  Service: Cardiovascular;;  . Esophagogastroduodenoscopy N/A 11/21/2014    ZOX:WRUE non-erosive gastritis/irregular z-line  . Cardiac catheterization N/A 01/26/2015    Procedure: Right Heart Cath;  Surgeon: Laurey Morale, MD;  Location: [facility] INVASIVE CV LAB;  Service: Cardiovascular;  Laterality: N/A;  . Cardioversion N/A 03/06/2015    Procedure: CARDIOVERSION;  Surgeon: Laurey Morale, MD;  Location: [facility] ENDOSCOPY;  Service: Cardiovascular;  Laterality: N/A;       HPI  from the history and physical done on the day of admission:    Chief Complaint: Dizziness  HPI: [name] is a 65 y.o. female with medical history significant of DM type 2, HLD, hypothyroidism, essential HTN, afib on chronic Eliquis, diastolic CHF, GERD, and dementia, who was evaluated in the ED roughly 6 hours prior with complaints of shoulder pain s/p fall without LOC. She was found to be mildly dehydrated, but her shoulder xray returned negative so she was given Dilaudid and discharged home. She presents again complaining of dizziness, weakness, and inability to ambulate. Per daughter, upon returning home, the patient stood up to get out of the far and her eyes rolled backwards. She noted slurred speech and an inability to follow commands. The patient reports that she was aware of her daughter's commands. Per daughter, the patient also appeared to be on the verge of going limp. The patient reports that she has never had dilaudid before. She denies any CP. She reports that recently her blood sugar has not been in good control.   ED Course: While in the ED, workup revealed that her BUN and Cr were elevated. Blood pressure was originally soft in the 90's. It responded to IVF. Her troponin was also noted  to be minimally elevated at 0.04. Hospitalist was asked to refer for admission for management of near syncope.      Hospital Course:    1)Near syncope - stable, likely due to mild dehydration and Dilaudid that she received in ED, CT c-spine and head were negative for any acute abnormalities. Troponin minimally elevated, remains steady. Echocardiogram without wall motion abnormalities, EF is preserved . Carotid artery Dopplers without hemodynamically significant stenosis, on telemetry monitored unitsignificant arrhythmias noted  2)DM type 2. - stable, okay to restart  metformin post discharge.   3)Paroxysmal Afib-  currently in SR. Continue amiodarone and Eliquis.  4)Chronic and Diastolic CHF-  Appears compensated at this point. Past echo shows an EF of 55-60% about a year ago. Repeat Echo with preserved EF and no significant wall motion abnormalities  5)Essential HTN/Hypothyroid- stable, c/n home meds   Discharge Condition: Stable  Follow UP  Follow-up Information    Follow up with [name], MD On 01/08/2016.   Specialty:  Family Medicine   Why:  at 12:45 pm   Contact information:   [address] [place] [address] [phone]          Diet and Activity recommendation:  As advised  Discharge Instructions     Discharge Instructions    Call MD for:  difficulty breathing, headache or visual disturbances    Complete by:  As directed      Call MD for:  persistant dizziness or light-headedness    Complete by:  As directed      Call MD for:  persistant nausea and vomiting    Complete by:  As directed      Call MD for:  temperature >100.4    Complete by:  As directed      Diet - low sodium heart healthy    Complete by:  As directed   Diabetic diet advised     Discharge instructions    Complete by:  As directed   Take medications as prescribed     Increase activity slowly    Complete by:  As directed              Discharge Medications         Medication List    TAKE these medications        acetaminophen 500 MG tablet  Commonly known as:  TYLENOL  Take 500 mg by mouth every 6 (six) hours as needed for moderate pain or headache.     amiodarone 200 MG tablet  Commonly known as:  PACERONE  TAKE ONE TABLET BY MOUTH DAILY.     dicyclomine 10 MG capsule  Commonly known as:  BENTYL  1-2 capsules before meals as needed for abdominal cramping and loose stool     DULoxetine 30 MG capsule  Commonly known as:  CYMBALTA  Take 30 mg by mouth 2 (two) times daily.     ELIQUIS 5 MG Tabs tablet  Generic drug:  apixaban  Take 5 mg by mouth 2 (two) times daily.     famotidine 20 MG tablet  Commonly known as:  PEPCID  Take 20 mg by mouth daily as needed for heartburn or indigestion.     furosemide 40 MG tablet  Commonly known as:  LASIX  Take 1 tablet (40 mg total) by mouth daily.     gabapentin 300 MG capsule  Commonly known as:  NEURONTIN  Take 300 mg by mouth 3 (three) times daily.     glipiZIDE 10 MG tablet  Commonly known as:  GLUCOTROL  Take 10 mg by mouth 2 (two) times daily before a meal.     HYDROcodone-acetaminophen 5-325 MG tablet  Commonly known as:  NORCO/VICODIN  Take 1 tablet by mouth every 6 (six) hours as needed for moderate pain.     LANTUS SOLOSTAR 100 UNIT/ML Solostar Pen  Generic drug:  Insulin Glargine  Inject 20-50 Units into the skin at bedtime as needed. Patient takes 20 units every night at bedtime if over  178.  Takes 50 units if her blood sugar is above 300.     levETIRAcetam 750 MG tablet  Commonly known as:  KEPPRA  Take 750 mg by mouth 2 (two) times daily.     levothyroxine 137 MCG tablet  Commonly known as:  SYNTHROID, LEVOTHROID  Take 137 mcg by mouth daily.     LORazepam 1 MG tablet  Commonly known as:  ATIVAN  Take 1 mg by mouth 3 (three) times daily as needed for anxiety or sleep. For anxiety     magnesium oxide 400 MG tablet  Commonly known as:  MAG-OX  Take 400 mg by mouth  daily.     metaxalone 800 MG tablet  Commonly known as:  SKELAXIN  Take 800 mg by mouth 3 (three) times daily.     metFORMIN 500 MG tablet  Commonly known as:  GLUCOPHAGE  Take 1,000 mg by mouth 2 (two) times daily with a meal.     multivitamin with minerals Tabs tablet  Take 1 tablet by mouth daily.     nitroGLYCERIN 0.4 MG SL tablet  Commonly known as:  NITROSTAT  Place 0.4 mg under the tongue every 5 (five) minutes as needed for chest pain. Reported on 07/08/2015     ondansetron 4 MG tablet  Commonly known as:  ZOFRAN  Take 1 tablet (4 mg total) by mouth 4 (four) times daily -  before meals and at bedtime. As needed for nausea.     pantoprazole 40 MG tablet  Commonly known as:  PROTONIX  Take 1 tablet (40 mg total) by mouth 2 (two) times daily before a meal.     POLY-IRON 150 150 MG capsule  Generic drug:  iron polysaccharides  TAKE 1 CAPSULE BY MOUTH 2 TIMES DAILY.     potassium chloride 10 MEQ tablet  Commonly known as:  K-DUR  Take 2 tablets (20 mEq total) by mouth 2 (two) times daily.     vitamin B-12 500 MCG tablet  Commonly known as:  CYANOCOBALAMIN  Take 500 mcg by mouth daily.        Major procedures and Radiology Reports - PLEASE review detailed and final reports for all details, in brief -    Ct Head Wo Contrast  12/25/2015  CLINICAL DATA:  65 year old female with fall EXAM: CT HEAD WITHOUT CONTRAST CT CERVICAL SPINE WITHOUT CONTRAST TECHNIQUE: Multidetector CT imaging of the head and cervical spine was performed following the standard protocol without intravenous contrast. Multiplanar CT image reconstructions of the cervical spine were also generated. COMPARISON:  Head CT dated 04/11/2014 FINDINGS: CT HEAD FINDINGS The ventricles and sulci are appropriate size for patient's age. Minimal periventricular and deep white matter chronic microvascular ischemic changes noted. There is no acute intracranial hemorrhage. No mass effect midline shift noted. The visualized  paranasal sinuses and mastoid air cells are clear. The calvarium is intact. CT CERVICAL SPINE FINDINGS There is no acute fracture or subluxation of the cervical spine.There is osteopenia with multilevel degenerative changes of the spine in.The odontoid and spinous processes are intact.There is normal anatomic alignment of the C1-C2 lateral masses. The visualized soft tissues appear unremarkable. IMPRESSION: No acute intracranial hemorrhage. No acute/traumatic cervical spine pathology. Electronically Signed   By: Elgie Collard M.D.   On: 12/25/2015 22:23   Ct Cervical Spine Wo Contrast  12/25/2015  CLINICAL DATA:  65 year old female with fall EXAM: CT HEAD WITHOUT CONTRAST CT CERVICAL SPINE WITHOUT CONTRAST TECHNIQUE: Multidetector CT imaging of the head  and cervical spine was performed following the standard protocol without intravenous contrast. Multiplanar CT image reconstructions of the cervical spine were also generated. COMPARISON:  Head CT dated 04/11/2014 FINDINGS: CT HEAD FINDINGS The ventricles and sulci are appropriate size for patient's age. Minimal periventricular and deep white matter chronic microvascular ischemic changes noted. There is no acute intracranial hemorrhage. No mass effect midline shift noted. The visualized paranasal sinuses and mastoid air cells are clear. The calvarium is intact. CT CERVICAL SPINE FINDINGS There is no acute fracture or subluxation of the cervical spine.There is osteopenia with multilevel degenerative changes of the spine in.The odontoid and spinous processes are intact.There is normal anatomic alignment of the C1-C2 lateral masses. The visualized soft tissues appear unremarkable. IMPRESSION: No acute intracranial hemorrhage. No acute/traumatic cervical spine pathology. Electronically Signed   By: Elgie Collard M.D.   On: 12/25/2015 22:23   Dg Esophagus  12/11/2015  CLINICAL DATA:  Difficulty swallowing and choking. EXAM: ESOPHOGRAM / BARIUM SWALLOW / BARIUM  TABLET STUDY TECHNIQUE: Combined double contrast and single contrast examination performed using effervescent crystals, thick barium liquid, and thin barium liquid. The patient was observed with fluoroscopy swallowing a 13 mm barium sulphate tablet. FLUOROSCOPY TIME:  Radiation Exposure Index (as provided by the fluoroscopic device): 40.7 mGy If the device does not provide the exposure index: Fluoroscopy Time:  2 minutes and 20 seconds Number of Acquired Images: COMPARISON:  None FINDINGS: Initial barium swallows demonstrate normal pharyngeal motion with swallowing. No laryngeal penetration or aspiration. No upper esophageal webs, strictures or diverticuli. Nonspecific esophageal dysmotility with occasional disruption of the primary peristaltic wave and occasional tertiary contractions. No intrinsic or extrinsic lesions are identified. The mucosal folds appear normal. Small to moderate-sized sliding-type hiatal hernia with spontaneous and inducible GE reflux. The 13 mm barium pill passed into the stomach without difficulty. IMPRESSION: 1. Small to moderate-sized sliding-type hiatal hernia with spontaneous and inducible GE reflux. 2. No stricture or mass. 3. Nonspecific esophageal dysmotility. Electronically Signed   By: Rudie Meyer M.D.   On: 12/11/2015 10:42   US Carotid Bilateral  12/28/2015  CLINICAL DATA:  Syncope, hypertension, coronary artery disease, diabetes, previous tobacco abuse. EXAM: BILATERAL CAROTID DUPLEX ULTRASOUND TECHNIQUE: Wallace Cullens scale imaging, color Doppler and duplex ultrasound was performed of bilateral carotid and vertebral arteries in the neck. COMPARISON:  11/08/2012 TECHNIQUE: Quantification of carotid stenosis is based on velocity parameters that correlate the residual internal carotid diameter with NASCET-based stenosis levels, using the diameter of the distal internal carotid lumen as the denominator for stenosis measurement. The following velocity measurements were obtained: PEAK  SYSTOLIC/END DIASTOLIC RIGHT ICA:                     84/21cm/sec CCA:                     82/10cm/sec SYSTOLIC ICA/CCA RATIO:  1.0 DIASTOLIC ICA/CCA RATIO: 2.1 ECA:                     68cm/sec LEFT ICA:                     89/27cm/sec CCA:                     82/15cm/sec SYSTOLIC ICA/CCA RATIO:  1.4 DIASTOLIC ICA/CCA RATIO: 2.1 ECA:                     87cm/sec FINDINGS:  RIGHT CAROTID ARTERY: Mild partially calcified plaque in the bulb and proximal ICA. No high-grade stenosis. Normal waveforms and color Doppler signal. RIGHT VERTEBRAL ARTERY:  Normal flow direction and waveform. LEFT CAROTID ARTERY: Eccentric partially calcified plaque in the bulb and ICA origin. No high-grade stenosis. Normal waveforms and color Doppler signal. LEFT VERTEBRAL ARTERY: Normal flow direction and waveform. IMPRESSION: 1. Mild bilateral carotid bifurcation and proximal ICA plaque, resulting in less than 50% diameter stenosis. 2.  Antegrade bilateral vertebral arterial flow. Electronically Signed   By: Corlis Leak M.D.   On: 12/28/2015 12:24   Dg Shoulder Left  12/25/2015  CLINICAL DATA:  LEFT shoulder pain, got dizzy and passed out while walking to the bathroom, fall, history diabetes mellitus, hypertension, CHF EXAM: LEFT SHOULDER - 2+ VIEW COMPARISON:  07/11/2013 FINDINGS: Marked osseous demineralization. AC joint alignment normal. No acute fracture, dislocation, or bone destruction. Visualized LEFT ribs grossly intact. Subsegmental atelectasis lower LEFT lung. IMPRESSION: Marked osseous demineralization. No definite acute bony abnormalities. Electronically Signed   By: Ulyses Southward M.D.   On: 12/25/2015 21:17    Micro Results     No results found for this or any previous visit (from the past 240 hour(s)).     Today   Subjective    [place] today has no new complaints, daughter at bedside, questions answered,          Patient has been seen and examined prior to discharge   Objective   Blood pressure  143/42, pulse 60, temperature 98.3 F (36.8 C), temperature source Oral, resp. rate 20, height 5\' 6"  (1.676 m), weight 100.6 kg (221 lb 12.5 oz), SpO2 96 %.   Intake/Output Summary (Last 24 hours) at 12/28/15 1259 Last data filed at 12/27/15 1900  Gross per 24 hour  Intake    840 ml  Output      0 ml  Net    840 ml    Exam Gen:- Awake  , in no apparent distress  HEENT:- Joy.AT,   Neck-Supple Neck,No JVD,  Lungs- mostly clear  CV- S1, S2 normal, history of atrial fibrillation but currently regular Abd-  +ve B.Sounds, Abd Soft, No tenderness,    Extremity/Skin:- Intact peripheral pulses  , no edema, Homans negative Neuro- no new focal neurological deficits, gait is steady Psych- affect is appropriate   Data Review   CBC w Diff: Lab Results  Component Value Date   WBC 9.8 12/26/2015   HGB 12.7 12/26/2015   HCT 38.3 12/26/2015   PLT 194 12/26/2015   LYMPHOPCT 20 12/26/2015   MONOPCT 10 12/26/2015   EOSPCT 0 12/26/2015   BASOPCT 0 12/26/2015    CMP: Lab Results  Component Value Date   NA 135 12/27/2015   K 3.2* 12/27/2015   CL 105 12/27/2015   CO2 23 12/27/2015   BUN 22* 12/27/2015   CREATININE 1.00 12/27/2015   CREATININE 0.70 02/13/2015   PROT 6.7 12/26/2015   ALBUMIN 3.6 12/26/2015   BILITOT 0.7 12/26/2015   ALKPHOS 73 12/26/2015   AST 23 12/26/2015   ALT 18 12/26/2015  .   Total Discharge time is about 33 minutes  Harryette Shuart M.D on 12/28/2015 at 12:59 PM  [facility]   Office  [phone]  Dragon dictation system was used to create this note, attempts have been made to correct errors, however presence of uncorrected errors is not a reflection quality of care provided

## 2015-12-28 NOTE — Care Management Important Message (Signed)
Important Message  Patient Details  Name: [name] MRN: [id number] Date of Birth: Sep 13, 1950   Medicare Important Message Given:  Yes    Maura Braaten, Chrystine Oiler, RN 12/28/2015, 9:34 AM

## 2015-12-29 NOTE — Progress Notes (Signed)
AVS reviewed with patient.  Verbalized understanding of discharge instructions, physician follow-up, medications.  Patient reports belongings intact and in possession at time of discharge.  Patient's IV removed during night shift.  Site WNL.  Patient transported by NT via wheelchair to main entrance for discharge.  Patient stable at time of discharge.

## 2015-12-31 ENCOUNTER — Encounter (HOSPITAL_COMMUNITY): Payer: Medicare Other

## 2015-12-31 ENCOUNTER — Encounter (HOSPITAL_COMMUNITY): Payer: Medicare Other | Attending: Oncology | Admitting: Oncology

## 2015-12-31 VITALS — BP 134/77 | HR 69 | Temp 98.9°F | Resp 20

## 2015-12-31 DIAGNOSIS — I480 Paroxysmal atrial fibrillation: Secondary | ICD-10-CM | POA: Diagnosis not present

## 2015-12-31 DIAGNOSIS — K3184 Gastroparesis: Secondary | ICD-10-CM | POA: Insufficient documentation

## 2015-12-31 DIAGNOSIS — E039 Hypothyroidism, unspecified: Secondary | ICD-10-CM | POA: Insufficient documentation

## 2015-12-31 DIAGNOSIS — D509 Iron deficiency anemia, unspecified: Secondary | ICD-10-CM | POA: Insufficient documentation

## 2015-12-31 DIAGNOSIS — M545 Low back pain: Secondary | ICD-10-CM | POA: Insufficient documentation

## 2015-12-31 DIAGNOSIS — K589 Irritable bowel syndrome without diarrhea: Secondary | ICD-10-CM | POA: Insufficient documentation

## 2015-12-31 DIAGNOSIS — Z7901 Long term (current) use of anticoagulants: Secondary | ICD-10-CM | POA: Insufficient documentation

## 2015-12-31 DIAGNOSIS — Z9889 Other specified postprocedural states: Secondary | ICD-10-CM | POA: Diagnosis not present

## 2015-12-31 DIAGNOSIS — K219 Gastro-esophageal reflux disease without esophagitis: Secondary | ICD-10-CM | POA: Insufficient documentation

## 2015-12-31 DIAGNOSIS — K449 Diaphragmatic hernia without obstruction or gangrene: Secondary | ICD-10-CM | POA: Diagnosis not present

## 2015-12-31 DIAGNOSIS — E669 Obesity, unspecified: Secondary | ICD-10-CM | POA: Diagnosis not present

## 2015-12-31 DIAGNOSIS — I5032 Chronic diastolic (congestive) heart failure: Secondary | ICD-10-CM | POA: Diagnosis not present

## 2015-12-31 DIAGNOSIS — G4733 Obstructive sleep apnea (adult) (pediatric): Secondary | ICD-10-CM | POA: Insufficient documentation

## 2015-12-31 DIAGNOSIS — I272 Other secondary pulmonary hypertension: Secondary | ICD-10-CM | POA: Insufficient documentation

## 2015-12-31 DIAGNOSIS — E1143 Type 2 diabetes mellitus with diabetic autonomic (poly)neuropathy: Secondary | ICD-10-CM | POA: Diagnosis not present

## 2015-12-31 DIAGNOSIS — N289 Disorder of kidney and ureter, unspecified: Secondary | ICD-10-CM | POA: Insufficient documentation

## 2015-12-31 DIAGNOSIS — Z87891 Personal history of nicotine dependence: Secondary | ICD-10-CM | POA: Insufficient documentation

## 2015-12-31 DIAGNOSIS — I959 Hypotension, unspecified: Secondary | ICD-10-CM | POA: Diagnosis not present

## 2015-12-31 DIAGNOSIS — I6523 Occlusion and stenosis of bilateral carotid arteries: Secondary | ICD-10-CM | POA: Diagnosis not present

## 2015-12-31 DIAGNOSIS — E785 Hyperlipidemia, unspecified: Secondary | ICD-10-CM | POA: Insufficient documentation

## 2015-12-31 LAB — CBC WITH DIFFERENTIAL/PLATELET
BASOS PCT: 1 %
Basophils Absolute: 0.1 10*3/uL (ref 0.0–0.1)
EOS ABS: 0.2 10*3/uL (ref 0.0–0.7)
Eosinophils Relative: 2 %
HEMATOCRIT: 39.2 % (ref 36.0–46.0)
HEMOGLOBIN: 12.4 g/dL (ref 12.0–15.0)
LYMPHS ABS: 3.1 10*3/uL (ref 0.7–4.0)
Lymphocytes Relative: 34 %
MCH: 31.1 pg (ref 26.0–34.0)
MCHC: 31.6 g/dL (ref 30.0–36.0)
MCV: 98.2 fL (ref 78.0–100.0)
Monocytes Absolute: 1.1 10*3/uL — ABNORMAL HIGH (ref 0.1–1.0)
Monocytes Relative: 12 %
NEUTROS ABS: 4.6 10*3/uL (ref 1.7–7.7)
NEUTROS PCT: 51 %
Platelets: 179 10*3/uL (ref 150–400)
RBC: 3.99 MIL/uL (ref 3.87–5.11)
RDW: 13.4 % (ref 11.5–15.5)
WBC: 9.1 10*3/uL (ref 4.0–10.5)

## 2015-12-31 LAB — IRON AND TIBC
IRON: 42 ug/dL (ref 28–170)
SATURATION RATIOS: 13 % (ref 10.4–31.8)
TIBC: 319 ug/dL (ref 250–450)
UIBC: 277 ug/dL

## 2015-12-31 LAB — FERRITIN: FERRITIN: 82 ng/mL (ref 11–307)

## 2015-12-31 MED ORDER — IRON POLYSACCH CMPLX-B12-FA 150-0.025-1 MG PO CAPS: ORAL_CAPSULE | ORAL | Status: DC

## 2015-12-31 NOTE — Progress Notes (Signed)
Tara Obey, MD [address] [place] [address]  Iron deficiency anemia - Plan: CBC, Ferritin, CBC with Differential, Iron and TIBC, Ferritin, Iron Polysacch Cmplx-B12-FA 150-0.025-1 MG CAPS  CURRENT THERAPY: Ferrex Forte daily.  IV iron replacement when needed.  INTERVAL HISTORY: [name] 65 y.o. female returns for followup of iron deficiency anemia after an extensive GI work-up that is negative including colonoscopy (11/08/2013), EGD (11/21/2014), and camera capsule study.  She was recently in the hospital for acute renal insufficiency, hypotension, and syncopal episode. She notes that she feels much improved since being discharged.  She notes an incidence while being in the hospital she was prescribed IV Dilaudid. This was given and she does not want this medication in the future given the intolerance she experienced which is likely a side effect of the medication.  Initially, her blood pressure was elevated, but at the end of today's visit, I rechecked her blood pressure and it was much improved at 133/74.  She denies any acute complaints. She denies any blood in her stool. She is tolerating oral iron. She was on poly-iron and at this point in time, since she is due for refills, I'll switch her to ferrex forte.   Review of Systems  Constitutional: Negative for fever, chills and weight loss.  HENT: Negative.   Eyes: Negative.   Respiratory: Negative.   Cardiovascular: Negative.   Gastrointestinal: Negative.   Genitourinary: Negative.   Musculoskeletal: Negative.   Skin: Negative.   Neurological: Negative.   Endo/Heme/Allergies: Negative.   Psychiatric/Behavioral: Negative.     Past Medical History  Diagnosis Date  . Chest pain     Negative cardiac catheterization in 2002; negative stress nuclear study in 2008  . Chronic diastolic CHF (congestive heart failure) (HCC)     a. EF predominantly normal during prior echoes but was 40% during 10/2014  echo. b. Most recent 01/2015 EF normal, 55-60%.  . Paroxysmal atrial fibrillation (HCC)   . Chronic anticoagulation   . Diabetes mellitus, type 2 (HCC)     Insulin therapy; exacerbated by prednisone  . Syncope     a. Admitted 05/2009; magnetic resonance imagin/ MRA - negative; etiology thought to be orthostasis secondary to drugs and dehydration. b. Syncope 02/2015 also felt 2/2 dehydration.  Marland Kitchen GERD (gastroesophageal reflux disease)   . IBS (irritable bowel syndrome)   . Hiatal hernia   . Gastroparesis     99% retention 05/2008 on GES  . Hyperlipidemia   . Hypertension   . Hypothyroid   . Chronic LBP     Surgical intervention in 1996  . Obesity   . OSA on CPAP   . Anemia     H/H of 10/30 with a normal MCV in 12/09  . Barrett's esophagus     Diagnosed 1995. Last EGD 2016-NO BARRETT'S.   . Seizures (HCC)   . Pulmonary hypertension (HCC) 01/2015    a. Predominantly pulmonary venous hypertension but may be component of PAH.    Past Surgical History  Procedure Laterality Date  . Cardiovascular stress test  2008    Stress nuclear study  . Back surgery  1996  . Laparoscopic cholecystectomy  1990s  . Total abdominal hysterectomy  1999  . Laminectomy  1995    L4-L5  . Carpal tunnel release  1994  . Esophagogastroduodenoscopy  2008    Barrett's without dysplasia. esphagus dilated. antral erosions, h.pylori serologies negative.  . Colonoscopy  11/2011  Dr. Darrick Penna: Internal hemorrhoids, mild diverticulosis. Random colon biopsies negative.  . Givens capsule study  12/07/2011    Proximal small bowel, rare AVM. Distal small bowel, multiple ulcers noted  . Esophagogastroduodenoscopy  11/2011    Dr. Darrick Penna: Barrett's esophagus, mild gastritis, diverticulum in the second portion of the duodenum repeat EGD 3 years. Small bowel biopsies negative. Gastric biopsy show reactive gastropathy but no H. pylori. Esophageal biopsies consistent with GERD. Next EGD 11/2014  . Givens capsule study N/A  09/27/2013    Distal small bowel ulcers extending to TI.  Marland Kitchen Givens capsule study N/A 10/10/2013    Procedure: GIVENS CAPSULE STUDY;  Surgeon: West Bali, MD;  Location: [facility];  Service: Endoscopy;  Laterality: N/A;  7:30  . Colonoscopy N/A 11/08/2013    SLF: Normal mucosa in the terminal ileum/The colon IS redundant/  Moderate diverticulosis throughout the entire colon. ileum bx benign. colon bx benign  . Biopsy N/A 11/08/2013    Procedure: BIOPSY  / Tissue sampling / ulcers present in small intestine;  Surgeon: West Bali, MD;  Location: [facility];  Service: Endoscopy;  Laterality: N/A;  . Cardiac catheterization  2002  . Right heart catheterization N/A 01/10/2014    Procedure: RIGHT HEART CATH;  Surgeon: Lesleigh Noe, MD;  Location: [facility] CATH LAB;  Service: Cardiovascular;  Laterality: N/A;  . Left heart catheterization with coronary angiogram  01/10/2014    Procedure: LEFT HEART CATHETERIZATION WITH CORONARY ANGIOGRAM;  Surgeon: Lesleigh Noe, MD;  Location: [facility] CATH LAB;  Service: Cardiovascular;;  . Esophagogastroduodenoscopy N/A 11/21/2014    QHU:TMLY non-erosive gastritis/irregular z-line  . Cardiac catheterization N/A 01/26/2015    Procedure: Right Heart Cath;  Surgeon: Laurey Morale, MD;  Location: [facility] INVASIVE CV LAB;  Service: Cardiovascular;  Laterality: N/A;  . Cardioversion N/A 03/06/2015    Procedure: CARDIOVERSION;  Surgeon: Laurey Morale, MD;  Location: [facility] ENDOSCOPY;  Service: Cardiovascular;  Laterality: N/A;    Family History  Problem Relation Age of Onset  . Hypertension Mother   . Alzheimer's disease Mother   . Stroke Mother   . Heart attack Mother   . Heart disease Neg Hx   . Hypertension Other   . Colon cancer Neg Hx   . Breast cancer Sister     Social History   Social History  . Marital Status: Married    Spouse Name: N/A  . Number of Children: N/A  . Years of Education: N/A   Occupational History  . 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     Social History Main Topics  . Smoking status: Former Smoker -- 0.25 packs/day for 15 years    Types: Cigarettes    Start date: 02/26/1966    Quit date: 07/01/1983  . Smokeless tobacco: Never Used     Comment: quit in 1984  . Alcohol Use: No     Comment: last etoh one year ago, never frequent  . Drug Use: No  . Sexual Activity: Yes    Birth Control/ Protection: None, Surgical   Other Topics Concern  . Not on file   Social History Narrative   Sedentary   4 children, "blended family"     PHYSICAL EXAMINATION  ECOG PERFORMANCE STATUS: 1 - Symptomatic but completely ambulatory  Filed Vitals:   12/31/15 1457 12/31/15 1501  BP: 142/118 134/77  Pulse: 69   Temp: 98.9 F (37.2 C)   Resp: 20  GENERAL:alert, no distress, well nourished, well developed, comfortable, cooperative, obese, smiling and unaccompanied in office visit. SKIN: skin color, texture, turgor are normal, no rashes or significant lesions HEAD: Normocephalic, No masses, lesions, tenderness or abnormalities EYES: normal, EOMI, Conjunctiva are pink and non-injected EARS: External ears normal OROPHARYNX:lips, buccal mucosa, and tongue normal and mucous membranes are moist  NECK: supple, trachea midline LYMPH:  not examined BREAST:not examined LUNGS: clear to auscultation  HEART: regular rate & rhythm ABDOMEN:abdomen soft, obese and normal bowel sounds BACK: Back symmetric, no curvature. EXTREMITIES:less then 2 second capillary refill, no joint deformities, effusion, or inflammation, no skin discoloration, no cyanosis  NEURO: alert & oriented x 3 with fluent speech, no focal motor/sensory deficits, gait normal   LABORATORY DATA: CBC    Component Value Date/Time   WBC 9.1 12/31/2015 1359   RBC 3.99 12/31/2015 1359   RBC 3.77* 06/30/2013 0318   HGB 12.4 12/31/2015 1359   HCT 39.2 12/31/2015 1359   PLT 179 12/31/2015 1359   MCV 98.2 12/31/2015 1359   MCH 31.1 12/31/2015 1359   MCHC  31.6 12/31/2015 1359   RDW 13.4 12/31/2015 1359   LYMPHSABS 3.1 12/31/2015 1359   MONOABS 1.1* 12/31/2015 1359   EOSABS 0.2 12/31/2015 1359   BASOSABS 0.1 12/31/2015 1359      Chemistry      Component Value Date/Time   NA 135 12/27/2015 0904   K 3.2* 12/27/2015 0904   CL 105 12/27/2015 0904   CO2 23 12/27/2015 0904   BUN 22* 12/27/2015 0904   CREATININE 1.00 12/27/2015 0904   CREATININE 0.70 02/13/2015 1414      Component Value Date/Time   CALCIUM 8.2* 12/27/2015 0904   CALCIUM 8.4 06/30/2013 0318   ALKPHOS 73 12/26/2015 0111   AST 23 12/26/2015 0111   ALT 18 12/26/2015 0111   BILITOT 0.7 12/26/2015 0111     Lab Results  Component Value Date   IRON 29 12/26/2014   TIBC 458* 12/26/2014   FERRITIN 68 09/25/2015     PENDING LABS:   RADIOGRAPHIC STUDIES:  Ct Head Wo Contrast  12/25/2015  CLINICAL DATA:  64 year old female with fall EXAM: CT HEAD WITHOUT CONTRAST CT CERVICAL SPINE WITHOUT CONTRAST TECHNIQUE: Multidetector CT imaging of the head and cervical spine was performed following the standard protocol without intravenous contrast. Multiplanar CT image reconstructions of the cervical spine were also generated. COMPARISON:  Head CT dated 04/11/2014 FINDINGS: CT HEAD FINDINGS The ventricles and sulci are appropriate size for patient's age. Minimal periventricular and deep white matter chronic microvascular ischemic changes noted. There is no acute intracranial hemorrhage. No mass effect midline shift noted. The visualized paranasal sinuses and mastoid air cells are clear. The calvarium is intact. CT CERVICAL SPINE FINDINGS There is no acute fracture or subluxation of the cervical spine.There is osteopenia with multilevel degenerative changes of the spine in.The odontoid and spinous processes are intact.There is normal anatomic alignment of the C1-C2 lateral masses. The visualized soft tissues appear unremarkable. IMPRESSION: No acute intracranial hemorrhage. No  acute/traumatic cervical spine pathology. Electronically Signed   By: Elgie Collard M.D.   On: 12/25/2015 22:23   Ct Cervical Spine Wo Contrast  12/25/2015  CLINICAL DATA:  65 year old female with fall EXAM: CT HEAD WITHOUT CONTRAST CT CERVICAL SPINE WITHOUT CONTRAST TECHNIQUE: Multidetector CT imaging of the head and cervical spine was performed following the standard protocol without intravenous contrast. Multiplanar CT image reconstructions of the cervical spine were also generated. COMPARISON:  Head CT  dated 04/11/2014 FINDINGS: CT HEAD FINDINGS The ventricles and sulci are appropriate size for patient's age. Minimal periventricular and deep white matter chronic microvascular ischemic changes noted. There is no acute intracranial hemorrhage. No mass effect midline shift noted. The visualized paranasal sinuses and mastoid air cells are clear. The calvarium is intact. CT CERVICAL SPINE FINDINGS There is no acute fracture or subluxation of the cervical spine.There is osteopenia with multilevel degenerative changes of the spine in.The odontoid and spinous processes are intact.There is normal anatomic alignment of the C1-C2 lateral masses. The visualized soft tissues appear unremarkable. IMPRESSION: No acute intracranial hemorrhage. No acute/traumatic cervical spine pathology. Electronically Signed   By: Elgie Collard M.D.   On: 12/25/2015 22:23   Dg Esophagus  12/11/2015  CLINICAL DATA:  Difficulty swallowing and choking. EXAM: ESOPHOGRAM / BARIUM SWALLOW / BARIUM TABLET STUDY TECHNIQUE: Combined double contrast and single contrast examination performed using effervescent crystals, thick barium liquid, and thin barium liquid. The patient was observed with fluoroscopy swallowing a 13 mm barium sulphate tablet. FLUOROSCOPY TIME:  Radiation Exposure Index (as provided by the fluoroscopic device): 40.7 mGy If the device does not provide the exposure index: Fluoroscopy Time:  2 minutes and 20 seconds Number of  Acquired Images: COMPARISON:  None FINDINGS: Initial barium swallows demonstrate normal pharyngeal motion with swallowing. No laryngeal penetration or aspiration. No upper esophageal webs, strictures or diverticuli. Nonspecific esophageal dysmotility with occasional disruption of the primary peristaltic wave and occasional tertiary contractions. No intrinsic or extrinsic lesions are identified. The mucosal folds appear normal. Small to moderate-sized sliding-type hiatal hernia with spontaneous and inducible GE reflux. The 13 mm barium pill passed into the stomach without difficulty. IMPRESSION: 1. Small to moderate-sized sliding-type hiatal hernia with spontaneous and inducible GE reflux. 2. No stricture or mass. 3. Nonspecific esophageal dysmotility. Electronically Signed   By: Rudie Meyer M.D.   On: 12/11/2015 10:42   US Carotid Bilateral  12/28/2015  CLINICAL DATA:  Syncope, hypertension, coronary artery disease, diabetes, previous tobacco abuse. EXAM: BILATERAL CAROTID DUPLEX ULTRASOUND TECHNIQUE: Wallace Cullens scale imaging, color Doppler and duplex ultrasound was performed of bilateral carotid and vertebral arteries in the neck. COMPARISON:  11/08/2012 TECHNIQUE: Quantification of carotid stenosis is based on velocity parameters that correlate the residual internal carotid diameter with NASCET-based stenosis levels, using the diameter of the distal internal carotid lumen as the denominator for stenosis measurement. The following velocity measurements were obtained: PEAK SYSTOLIC/END DIASTOLIC RIGHT ICA:                     84/21cm/sec CCA:                     82/10cm/sec SYSTOLIC ICA/CCA RATIO:  1.0 DIASTOLIC ICA/CCA RATIO: 2.1 ECA:                     68cm/sec LEFT ICA:                     89/27cm/sec CCA:                     82/15cm/sec SYSTOLIC ICA/CCA RATIO:  1.4 DIASTOLIC ICA/CCA RATIO: 2.1 ECA:                     87cm/sec FINDINGS: RIGHT CAROTID ARTERY: Mild partially calcified plaque in the bulb and  proximal ICA. No high-grade stenosis. Normal waveforms and color Doppler signal. RIGHT VERTEBRAL ARTERY:  Normal  flow direction and waveform. LEFT CAROTID ARTERY: Eccentric partially calcified plaque in the bulb and ICA origin. No high-grade stenosis. Normal waveforms and color Doppler signal. LEFT VERTEBRAL ARTERY: Normal flow direction and waveform. IMPRESSION: 1. Mild bilateral carotid bifurcation and proximal ICA plaque, resulting in less than 50% diameter stenosis. 2.  Antegrade bilateral vertebral arterial flow. Electronically Signed   By: Corlis Leak M.D.   On: 12/28/2015 12:24   Dg Shoulder Left  12/25/2015  CLINICAL DATA:  LEFT shoulder pain, got dizzy and passed out while walking to the bathroom, fall, history diabetes mellitus, hypertension, CHF EXAM: LEFT SHOULDER - 2+ VIEW COMPARISON:  07/11/2013 FINDINGS: Marked osseous demineralization. AC joint alignment normal. No acute fracture, dislocation, or bone destruction. Visualized LEFT ribs grossly intact. Subsegmental atelectasis lower LEFT lung. IMPRESSION: Marked osseous demineralization. No definite acute bony abnormalities. Electronically Signed   By: Ulyses Southward M.D.   On: 12/25/2015 21:17     PATHOLOGY:    ASSESSMENT AND PLAN:  Iron deficiency anemia Iron deficiency anemia having undergone an extensive GI work-up that was negative including colonoscopy (11/08/2013), EGD (11/21/2014), and camera capsule study.   Oncology Flowsheet 07/10/2015 07/17/2015  ferric gluconate (NULECIT) IV 125 mg 125 mg   Labs today: CBC diff, iron/TIBC, ferritin.  I personally reviewed and went over laboratory results with the patient.  The results are noted within this dictation.  I personally reviewed and went over radiographic studies with the patient.  The results are noted within this dictation.  Mammogram was negative in Jan 2017.  Chart is reviewed.  Recent hospitalization for acute renal insufficiency with hypotension and near syncopal episode is  noted.    Labs in 3 months: CBC, ferritin  Labs in 6 months: CBC diff, iron/TIBC, ferritin  She is due for a refill on her PO iron.  I will switch her to ferrex forte.  Return in 6 months for follow-up    ORDERS PLACED FOR THIS ENCOUNTER: Orders Placed This Encounter  Procedures  . CBC  . Ferritin  . CBC with Differential  . Iron and TIBC  . Ferritin    MEDICATIONS PRESCRIBED THIS ENCOUNTER: Meds ordered this encounter  Medications  . Iron Polysacch Cmplx-B12-FA 150-0.025-1 MG CAPS    Sig: TAKE 1 TABLET DAILY    Dispense:  30 each    Refill:  5    THERAPY PLAN:  We will continue to monitor iron studies and provide IV iron as indicated.  All questions were answered. The patient knows to call the clinic with any problems, questions or concerns. We can certainly see the patient much sooner if necessary.  Patient and plan discussed with Dr. Loma Messing and she is in agreement with the aforementioned.   This note is electronically signed by: Tina Griffiths 12/31/2015 3:19 PM

## 2015-12-31 NOTE — Patient Instructions (Signed)
[facility] at [facility] Discharge Instructions  RECOMMENDATIONS MADE BY THE CONSULTANT AND ANY TEST RESULTS WILL BE SENT TO YOUR REFERRING PHYSICIAN.  LABS IN 3 MONTHS AND 6 MONTHS   RETURN TO CLINIC IN 6 MONTHS   Thank you for choosing [facility] at [facility] to provide your oncology and hematology care.  To afford each patient quality time with our provider, please arrive at least 15 minutes before your scheduled appointment time.   Beginning January 23rd 2017 lab work for the [facility] will be done in the  Main lab at [facility] on 1st floor. If you have a lab appointment with the Cancer Center please come in thru the  Main Entrance and check in at the main information desk  You need to re-schedule your appointment should you arrive 10 or more minutes late.  We strive to give you quality time with our providers, and arriving late affects you and other patients whose appointments are after yours.  Also, if you no show three or more times for appointments you may be dismissed from the clinic at the providers discretion.     Again, thank you for choosing [facility].  Our hope is that these requests will decrease the amount of time that you wait before being seen by our physicians.       _____________________________________________________________  Should you have questions after your visit to [facility], please contact our office at [phone] between the hours of 8:30 a.m. and 4:30 p.m.  Voicemails left after 4:30 p.m. will not be returned until the following business day.  For prescription refill requests, have your pharmacy contact our office.         Resources For Cancer Patients and their Caregivers ? American Cancer Society: Can assist with transportation, wigs, general needs, runs Look Good Feel Better.        (708) 132-0845 ? Cancer Care: Provides financial assistance, online support  groups, medication/co-pay assistance.  1-800-813-HOPE (724)758-7066) ? [facility] Assists [place] cancer patients and their families through emotional , educational and financial support.  (787)735-2432 ? [place] DSS Where to apply for food stamps, Medicaid and utility assistance. 765 046 0344 ? [facility]: Transportation to medical appointments. 667-517-1245 ? Social Security Administration: May apply for disability if have a Stage IV cancer. (684) 409-2776 402-402-4393 ? [employer], Disability and Transit Services: Assists with nutrition, care and transit needs. 276-516-4766  Cancer Center Support Programs: @10RELATIVEDAYS @ > Cancer Support Group  2nd Tuesday of the month 1pm-2pm, Journey Room  > Creative Journey  3rd Tuesday of the month 1130am-1pm, Journey Room  > Look Good Feel Better  1st Wednesday of the month 10am-12 noon, Journey Room (Call American Cancer Society to register [phone])

## 2015-12-31 NOTE — Assessment & Plan Note (Addendum)
Iron deficiency anemia having undergone an extensive GI work-up that was negative including colonoscopy (11/08/2013), EGD (11/21/2014), and camera capsule study.   Oncology Flowsheet 07/10/2015 07/17/2015  ferric gluconate (NULECIT) IV 125 mg 125 mg   Labs today: CBC diff, iron/TIBC, ferritin.  I personally reviewed and went over laboratory results with the patient.  The results are noted within this dictation.  I personally reviewed and went over radiographic studies with the patient.  The results are noted within this dictation.  Mammogram was negative in Jan 2017.  Chart is reviewed.  Recent hospitalization for acute renal insufficiency with hypotension and near syncopal episode is noted.    Labs in 3 months: CBC, ferritin  Labs in 6 months: CBC diff, iron/TIBC, ferritin  She is due for a refill on her PO iron.  I will switch her to ferrex forte.  Return in 6 months for follow-up

## 2016-01-01 ENCOUNTER — Other Ambulatory Visit (HOSPITAL_COMMUNITY): Payer: Self-pay | Admitting: Oncology

## 2016-01-15 DIAGNOSIS — I482 Chronic atrial fibrillation: Secondary | ICD-10-CM | POA: Diagnosis not present

## 2016-01-15 DIAGNOSIS — E1142 Type 2 diabetes mellitus with diabetic polyneuropathy: Secondary | ICD-10-CM | POA: Diagnosis not present

## 2016-01-15 DIAGNOSIS — E876 Hypokalemia: Secondary | ICD-10-CM | POA: Diagnosis not present

## 2016-01-15 DIAGNOSIS — I952 Hypotension due to drugs: Secondary | ICD-10-CM | POA: Diagnosis not present

## 2016-01-19 ENCOUNTER — Encounter (HOSPITAL_COMMUNITY): Payer: Medicare Other | Attending: Oncology

## 2016-01-19 ENCOUNTER — Other Ambulatory Visit: Payer: Self-pay | Admitting: Cardiology

## 2016-01-19 ENCOUNTER — Encounter (HOSPITAL_COMMUNITY): Payer: Self-pay

## 2016-01-19 VITALS — BP 142/74 | HR 54 | Temp 98.3°F | Resp 18

## 2016-01-19 DIAGNOSIS — I480 Paroxysmal atrial fibrillation: Secondary | ICD-10-CM | POA: Insufficient documentation

## 2016-01-19 DIAGNOSIS — Z87891 Personal history of nicotine dependence: Secondary | ICD-10-CM | POA: Insufficient documentation

## 2016-01-19 DIAGNOSIS — G4733 Obstructive sleep apnea (adult) (pediatric): Secondary | ICD-10-CM | POA: Insufficient documentation

## 2016-01-19 DIAGNOSIS — E039 Hypothyroidism, unspecified: Secondary | ICD-10-CM | POA: Insufficient documentation

## 2016-01-19 DIAGNOSIS — N289 Disorder of kidney and ureter, unspecified: Secondary | ICD-10-CM | POA: Insufficient documentation

## 2016-01-19 DIAGNOSIS — E785 Hyperlipidemia, unspecified: Secondary | ICD-10-CM | POA: Insufficient documentation

## 2016-01-19 DIAGNOSIS — D509 Iron deficiency anemia, unspecified: Secondary | ICD-10-CM | POA: Diagnosis present

## 2016-01-19 DIAGNOSIS — Z7901 Long term (current) use of anticoagulants: Secondary | ICD-10-CM | POA: Insufficient documentation

## 2016-01-19 DIAGNOSIS — I5032 Chronic diastolic (congestive) heart failure: Secondary | ICD-10-CM | POA: Insufficient documentation

## 2016-01-19 DIAGNOSIS — M545 Low back pain: Secondary | ICD-10-CM | POA: Insufficient documentation

## 2016-01-19 DIAGNOSIS — E669 Obesity, unspecified: Secondary | ICD-10-CM | POA: Insufficient documentation

## 2016-01-19 DIAGNOSIS — I6523 Occlusion and stenosis of bilateral carotid arteries: Secondary | ICD-10-CM | POA: Insufficient documentation

## 2016-01-19 DIAGNOSIS — K449 Diaphragmatic hernia without obstruction or gangrene: Secondary | ICD-10-CM | POA: Insufficient documentation

## 2016-01-19 DIAGNOSIS — E1143 Type 2 diabetes mellitus with diabetic autonomic (poly)neuropathy: Secondary | ICD-10-CM | POA: Insufficient documentation

## 2016-01-19 DIAGNOSIS — K219 Gastro-esophageal reflux disease without esophagitis: Secondary | ICD-10-CM | POA: Insufficient documentation

## 2016-01-19 DIAGNOSIS — I272 Other secondary pulmonary hypertension: Secondary | ICD-10-CM | POA: Insufficient documentation

## 2016-01-19 DIAGNOSIS — K3184 Gastroparesis: Secondary | ICD-10-CM | POA: Insufficient documentation

## 2016-01-19 DIAGNOSIS — K589 Irritable bowel syndrome without diarrhea: Secondary | ICD-10-CM | POA: Insufficient documentation

## 2016-01-19 DIAGNOSIS — I959 Hypotension, unspecified: Secondary | ICD-10-CM | POA: Insufficient documentation

## 2016-01-19 DIAGNOSIS — Z9889 Other specified postprocedural states: Secondary | ICD-10-CM | POA: Insufficient documentation

## 2016-01-19 IMAGING — CT CT ABD-PELV W/ CM
2 of 5 series · 16 of 46 positions shown, 18 images · IV contrast (Omnipaque 300)
Comparison: CT ABDOMEN W/CM dated 01/12/2007

CLINICAL DATA: Lower abdominal pain. Diarrhea. Irritable bowel
syndrome. Gastroparesis. Weight loss. Diabetes.

EXAM:
CT ABDOMEN AND PELVIS WITH CONTRAST
TECHNIQUE: Multidetector CT imaging of the abdomen and pelvis was performed
using the standard protocol following bolus administration of
intravenous contrast.
CONTRAST:  100mL OMNIPAQUE IOHEXOL 300 MG/ML  SOLN

[Series 2: abd_pel_with 5.0 b40f · axial · 0.73mm/px · z∈[-470,-35]mm · 13 of 99 slices shown, 15 images]
[im 6/99  soft-tissue]
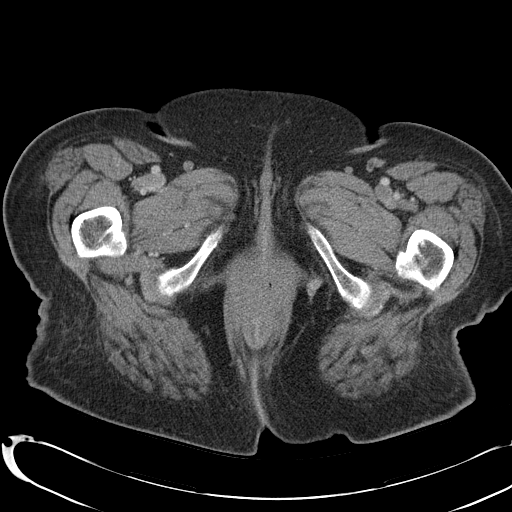
[im 6/99  bone]
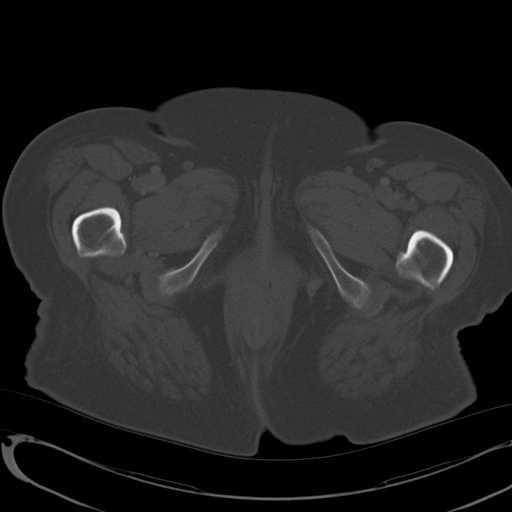
[im 16/99  soft-tissue]
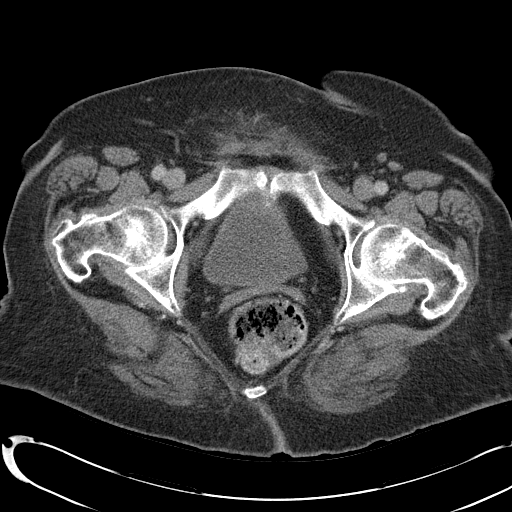
[im 21/99  soft-tissue]
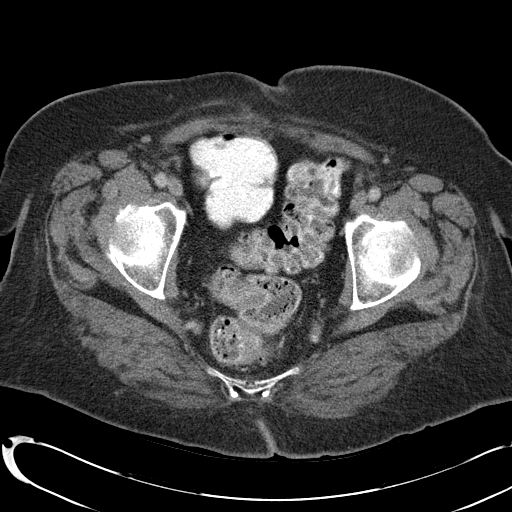
[im 26/99  soft-tissue]
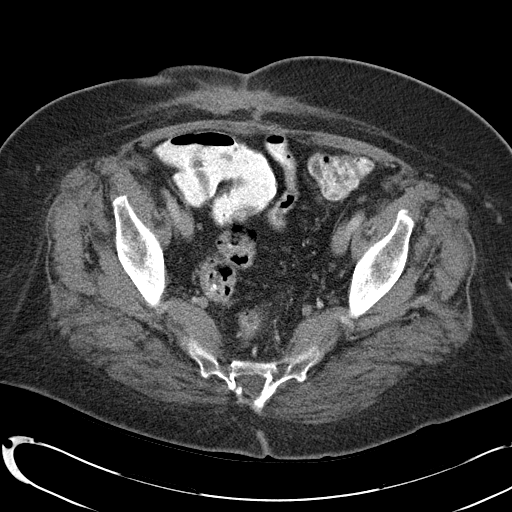
[im 37/99  soft-tissue]
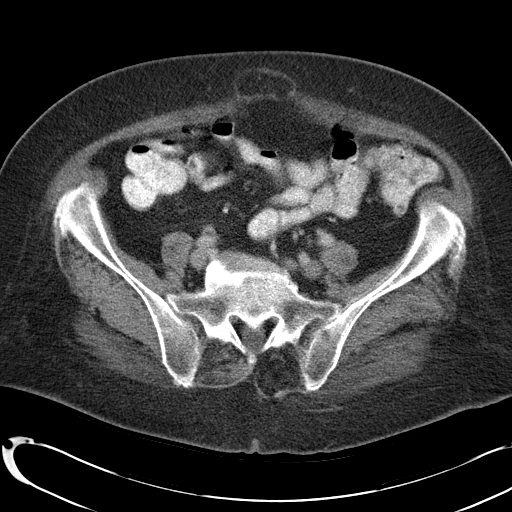
[im 42/99  soft-tissue]
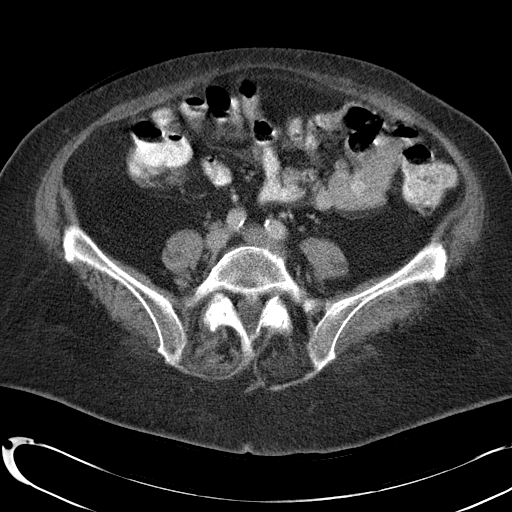
[im 52/99  soft-tissue]
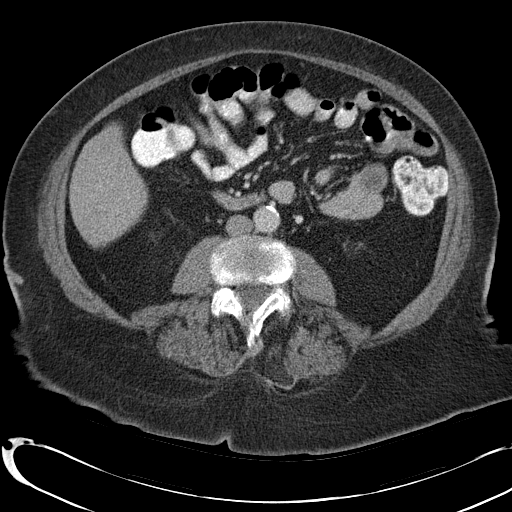
[im 57/99  soft-tissue]
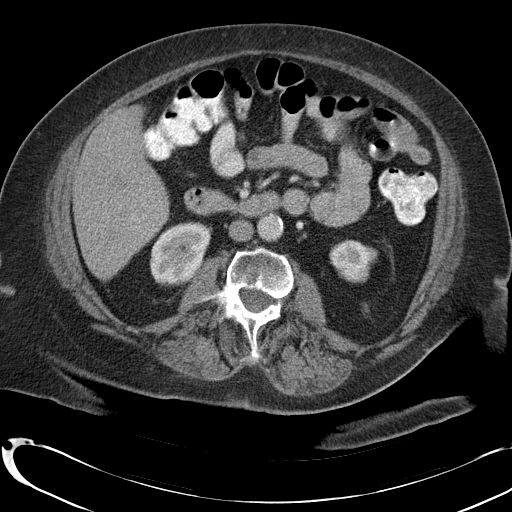
[im 62/99  soft-tissue]
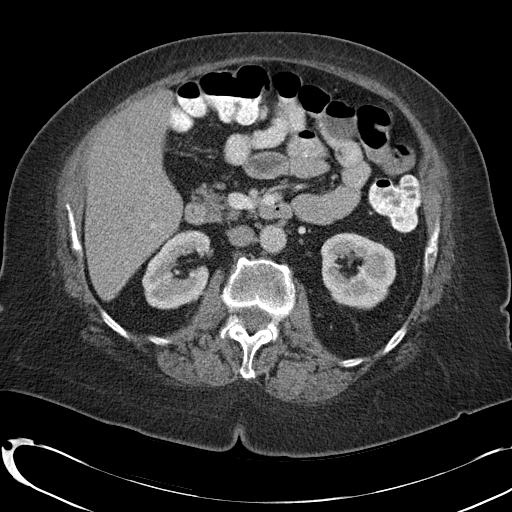
[im 62/99  bone]
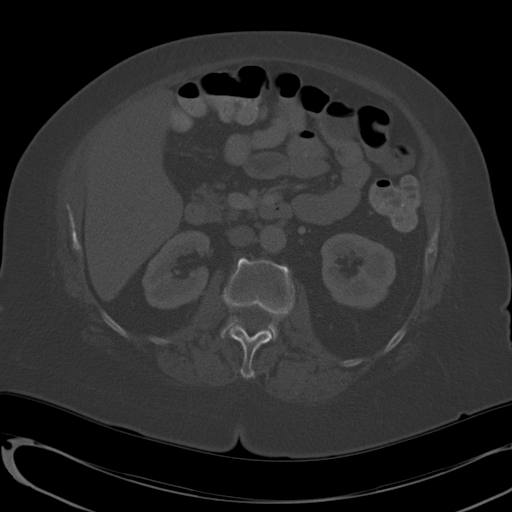
[im 73/99  soft-tissue]
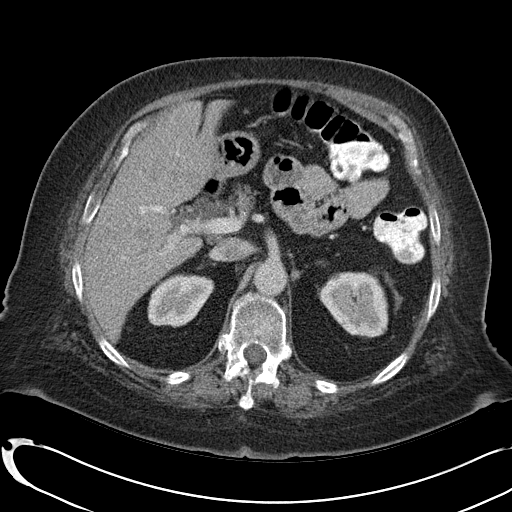
[im 78/99  soft-tissue]
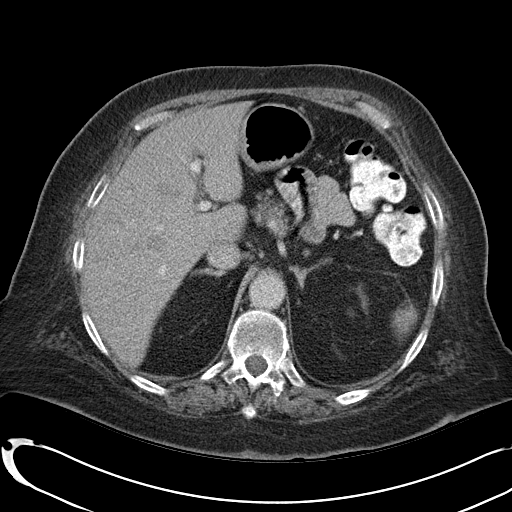
[im 83/99  soft-tissue]
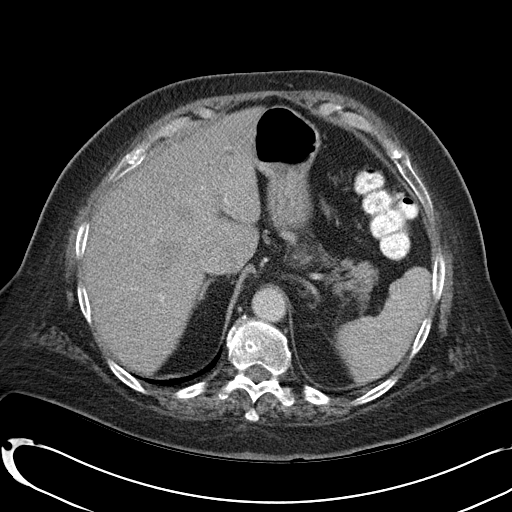
[im 93/99  soft-tissue]
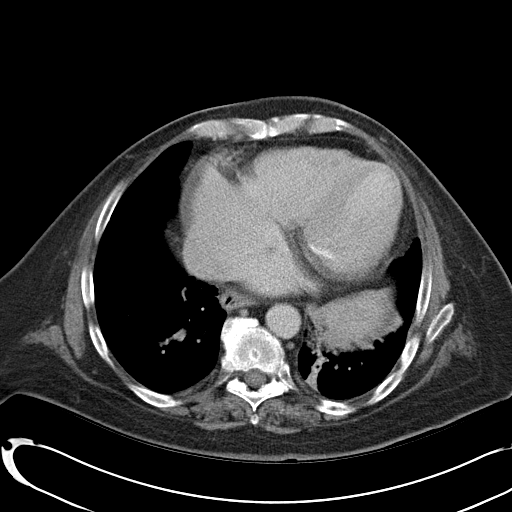

[Series 4: abd_pel_with 3.0 spo · coronal · 0.76mm/px · 3 of 102 slices shown]
[im 34/102  soft-tissue]
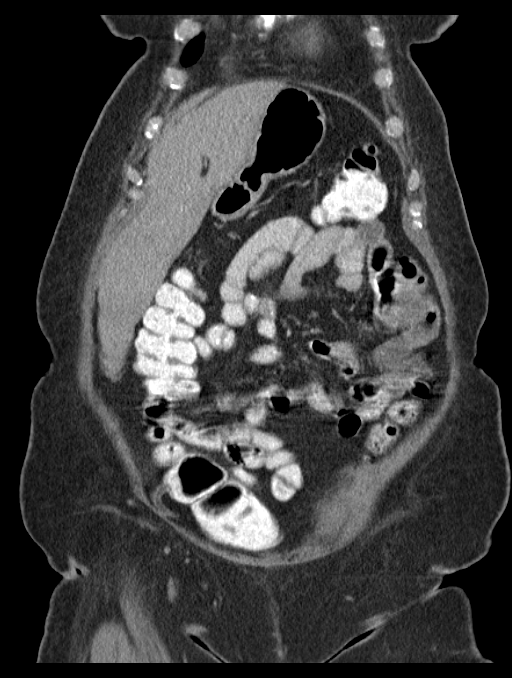
[im 45/102  soft-tissue]
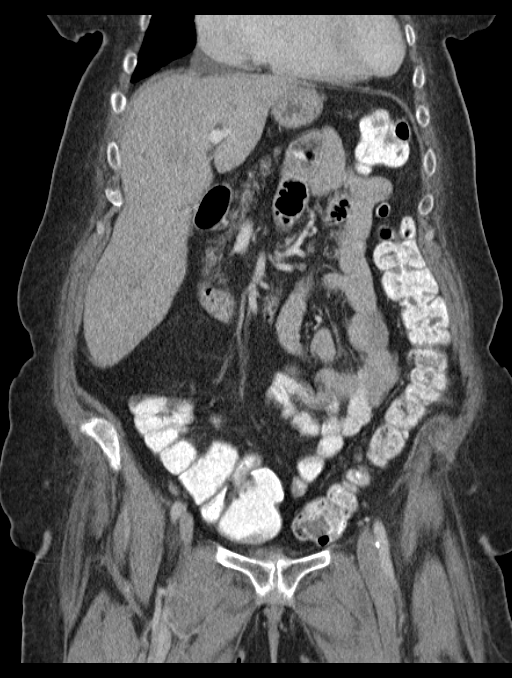
[im 57/102  soft-tissue]
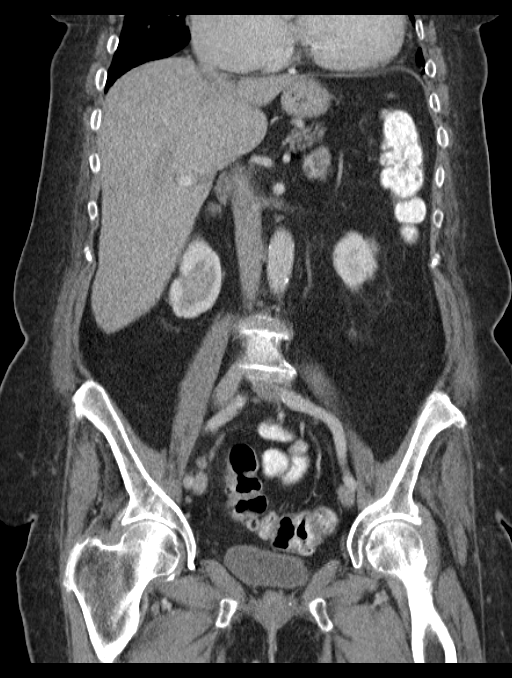

[16 of 46 positions shown; findings below may reference images not displayed]

FINDINGS: Atelectasis in the left lower lobe. Moderate to prominent
cardiomegaly with dense calcification of the mitral valve. The
liver, spleen, pancreas, and adrenal glands appear unremarkable.
Gallbladder surgically absent. Several hypodense lesions of the left
kidney are below 1 cm in size and likely cysts

Aortoiliac atherosclerotic calcification. No dilated bowel observed.
Orally administered contrast extends through to the rectum.

Supraumbilical hernia contains adipose tissue. Scattered colonic
diverticula noted without active diverticulitis.

Urinary bladder unremarkable. Uterus absent. Ovaries not well seen.

Lower lumbar facet arthropathy noted with bony foraminal stenosis
eccentric to the left at L4-5 and to a lesser extent at L3-4.
IMPRESSION: 1. A cause for the patient's lower abdominal pain is not observed.
2. Moderate to prominent cardiomegaly.
3. Mild atelectasis in the left lower lobe.
4. Dense mitral valve calcification.
5. Small hypodense left renal lesions, probably cysts.
6. Super umbilical hernia contains adipose tissue.
7. Scattered colonic diverticula.
8. Lower lumbar spondylosis.

## 2016-01-19 MED ORDER — SODIUM CHLORIDE 0.9 % IV SOLN
510.0000 mg | Freq: Once | INTRAVENOUS | Status: AC
  Administered 2016-01-19: 510 mg via INTRAVENOUS
  Filled 2016-01-19: qty 17

## 2016-01-19 MED ORDER — SODIUM CHLORIDE 0.9 % IV SOLN
Freq: Once | INTRAVENOUS | Status: AC
  Administered 2016-01-19: 10:00:00 via INTRAVENOUS

## 2016-01-19 NOTE — Patient Instructions (Signed)
[facility] at [facility] Discharge Instructions  RECOMMENDATIONS MADE BY THE CONSULTANT AND ANY TEST RESULTS WILL BE SENT TO YOUR REFERRING PHYSICIAN.  Iron infusion today Follow up as scheduled Please call the clinic if you have any questions or concerns   Thank you for choosing [facility] at [facility] to provide your oncology and hematology care.  To afford each patient quality time with our provider, please arrive at least 15 minutes before your scheduled appointment time.   Beginning January 23rd 2017 lab work for the Cancer Center will be done in the  Main lab at [place] on 1st floor. If you have a lab appointment with the Cancer Center please come in thru the  Main Entrance and check in at the main information desk  You need to re-schedule your appointment should you arrive 10 or more minutes late.  We strive to give you quality time with our providers, and arriving late affects you and other patients whose appointments are after yours.  Also, if you no show three or more times for appointments you may be dismissed from the clinic at the providers discretion.     Again, thank you for choosing [facility].  Our hope is that these requests will decrease the amount of time that you wait before being seen by our physicians.       _____________________________________________________________  Should you have questions after your visit to [facility], please contact our office at [phone] between the hours of 8:30 a.m. and 4:30 p.m.  Voicemails left after 4:30 p.m. will not be returned until the following business day.  For prescription refill requests, have your pharmacy contact our office.         Resources For Cancer Patients and their Caregivers ? American Cancer Society: Can assist with transportation, wigs, general needs, runs Look Good Feel Better.        1-888-227-6333 ? Cancer Care: Provides  financial assistance, online support groups, medication/co-pay assistance.  1-800-813-HOPE (4673) ? [facility] Assists [place] cancer patients and their families through emotional , educational and financial support.  336-427-4357 ? [place] DSS Where to apply for food stamps, Medicaid and utility assistance. 336-342-1394 ? [facility]: Transportation to medical appointments. 336-347-2287 ? Social Security Administration: May apply for disability if have a Stage IV cancer. 336-342-7796 1-800-772-1213 ? [place] Aging, Disability and Transit Services: Assists with nutrition, care and transit needs. 336-349-2343  Cancer Center Support Programs: @[email or web address]@ > Cancer Support Group  2nd Tuesday of the month 1pm-2pm, Journey Room  > Creative Journey  3rd Tuesday of the month 1130am-1pm, Journey Room  > Look Good Feel Better  1st Wednesday of the month 10am-12 noon, Journey Room (Call American Cancer Society to register 1-800-395-5775)    

## 2016-01-19 NOTE — Progress Notes (Signed)
[name] Horan Tolerated iron infusion well  Discharged ambulatory after 30 minute wait period

## 2016-01-21 ENCOUNTER — Other Ambulatory Visit: Payer: Self-pay | Admitting: Internal Medicine

## 2016-01-21 DIAGNOSIS — R0600 Dyspnea, unspecified: Secondary | ICD-10-CM

## 2016-01-22 ENCOUNTER — Encounter: Payer: Self-pay | Admitting: Internal Medicine

## 2016-01-22 ENCOUNTER — Ambulatory Visit (INDEPENDENT_AMBULATORY_CARE_PROVIDER_SITE_OTHER): Payer: Medicare Other | Admitting: Internal Medicine

## 2016-01-22 VITALS — BP 126/84 | HR 61 | Ht 65.0 in | Wt 234.0 lb

## 2016-01-22 DIAGNOSIS — R0600 Dyspnea, unspecified: Secondary | ICD-10-CM

## 2016-01-22 LAB — PULMONARY FUNCTION TEST
DL/VA % PRED: 89 %
DL/VA: 4.39 ml/min/mmHg/L
DLCO COR % PRED: 53 %
DLCO cor: 13.81 ml/min/mmHg
DLCO unc % pred: 54 %
DLCO unc: 14.05 ml/min/mmHg
FEF 25-75 POST: 3.11 L/s
FEF 25-75 Pre: 1.81 L/sec
FEF2575-%CHANGE-POST: 71 %
FEF2575-%PRED-POST: 148 %
FEF2575-%Pred-Pre: 86 %
FEV1-%CHANGE-POST: 16 %
FEV1-%Pred-Post: 66 %
FEV1-%Pred-Pre: 56 %
FEV1-PRE: 1.4 L
FEV1-Post: 1.63 L
FEV1FVC-%CHANGE-POST: 5 %
FEV1FVC-%PRED-PRE: 111 %
FEV6-%Change-Post: 9 %
FEV6-%Pred-Post: 58 %
FEV6-%Pred-Pre: 52 %
FEV6-POST: 1.8 L
FEV6-Pre: 1.64 L
FEV6FVC-%PRED-POST: 104 %
FEV6FVC-%Pred-Pre: 104 %
FVC-%CHANGE-POST: 9 %
FVC-%PRED-POST: 55 %
FVC-%PRED-PRE: 50 %
FVC-POST: 1.8 L
FVC-PRE: 1.64 L
POST FEV1/FVC RATIO: 90 %
PRE FEV6/FVC RATIO: 100 %
Post FEV6/FVC ratio: 100 %
Pre FEV1/FVC ratio: 86 %
RV % PRED: 84 %
RV: 1.85 L
TLC % pred: 70 %
TLC: 3.68 L

## 2016-01-22 NOTE — Patient Instructions (Addendum)
Please schedule a follow up visit in 6 months but call sooner if needed with cxr / pfts on return unless you stop the amiodarone in the meantime

## 2016-01-22 NOTE — Progress Notes (Signed)
Subjective:    Patient ID: [name], female    DOB: 1951-06-14,   MRN: [id number]    Brief patient profile:  65 yowf quit smoking 1984 no symptoms referred by Dr Shirlee Latch to pulmonary clinic 04/21/2015 with abn pfts p starting amiodarone  01/29/2015 for CAF     History of Present Illness  04/21/2015 1st [facility] office visit/ [name]   Chief Complaint  Patient presents with  . Pulmonary Consult    Referred by Dr. Marca Ancona for eval of abnormal PFT's . Pt had PFT done to est baseline before starting amiodarone. She c/o SOB and cough for the past 3 months. She states that she is SOB somtimes just sitting and walking short distances. Her cough is prod with clear sputum, mainly in the am.   able to [employer] pushing cart slow pace s sob  Cough x 2 years    Carries dx of gastroparesis on marinol and gaining wt  Cough is worst first thing in am x years  rec Add pepcid 20 mg at bedtime  GERD diet     07/22/2015  f/u ov/[name] re:  ? Amiodarone tox  Chief Complaint  Patient presents with  . Follow-up    PFT done today. Pt states her breathing has improved. She still c/o occ cough with clear sputum.   cough much better in am since adding pepcid at hs/ limited by knee with activity/ not sob  rec Walk as much as your knee will allow Return for increase cough or difficulty with breathing with exertion  Please schedule a follow up visit in 6  months but call sooner if needed with pfts on return - cancel if you stop amiodarone in the meantime   01/22/2016  f/u ov/[name] re: doe on amiodarone  Chief Complaint  Patient presents with  . Follow-up    PFT done today. Breathing is doing well. No new co's today.   limited more by back than knees   No obvious day to day or daytime variability or assoc excess/ purulent sputum or mucus plugs or hemoptysis or cp or chest tightness, subjective wheeze or overt sinus or hb symptoms. No unusual exp hx or h/o childhood pna/ asthma or knowledge  of premature birth.  Sleeping ok without nocturnal  or early am exacerbation  of respiratory  c/o's or need for noct saba. Also denies any obvious fluctuation of symptoms with weather or environmental changes or other aggravating or alleviating factors except as outlined above   Current Medications, Allergies, Complete Past Medical History, Past Surgical History, Family History, and Social History were reviewed in [employer] record.  ROS  The following are not active complaints unless bolded sore throat, dysphagia, dental problems, itching, sneezing,  nasal congestion or excess/ purulent secretions, ear ache,   fever, chills, sweats, unintended wt loss, classically pleuritic or exertional cp,  orthopnea pnd or leg swelling, presyncope, palpitations, abdominal pain, anorexia, nausea, vomiting, diarrhea  or change in bowel or bladder habits, change in stools or urine, dysuria,hematuria,  rash, arthralgias, visual complaints, headache, numbness, weakness or ataxia or problems with walking or coordination,  change in mood/affect or memory.                          Objective:   Physical Exam  amb wf appears > stated age   07/22/2015       202  > 01/22/2016    234  04/21/15 189 lb 3.2 oz (85.821 kg)  03/20/15 174 lb (78.926 kg)  03/16/15 172 lb (78.019 kg)    Vital signs reviewed   HEENT: nl dentition, turbinates, and orophanx. Nl external ear canals without cough reflex   NECK :  without JVD/Nodes/TM/ nl carotid upstrokes bilaterally   LUNGS: no acc muscle use, clear to A and P bilaterally without cough on insp or exp maneuvers   CV:  RRR  no s3 or murmur or increase in P2, min bilateral lower ext  edema s pitting   ABD:  Obese/ soft and nontender with nl excursion in the supine position. No bruits or organomegaly, bowel sounds nl  MS:  warm without deformities, calf tenderness, cyanosis or clubbing  SKIN: warm and dry without lesions    NEURO:  alert,  approp, no deficits    Labs   reviewed   Lab Results  Component Value Date   ESRSEDRATE 20 04/21/2015    Lab Results  Component Value Date   PROBNP 470.0* 04/21/2015    CXR PA and Lateral:   04/21/2015 :    I personally reviewed images and agree with radiology impression as follows:   Cardiomegaly without decompensation. Chronic changes in the left mid and lower lung (min elevation of L HD/ overall better aeration than prev studies)      Assessment & Plan:

## 2016-01-22 NOTE — Progress Notes (Signed)
PFT done today. 

## 2016-01-24 ENCOUNTER — Encounter: Payer: Self-pay | Admitting: Internal Medicine

## 2016-01-24 NOTE — Assessment & Plan Note (Signed)
Placed on amiodarone 01/29/15  04/10/15  FEV1  1.49 (60%) ratio 88 and dlco 53 corrects to 89% at amiodarone 400 04/21/2015  Walked RA x 2 laps @ 185 ft each stopped due to  Dizzy and legs gave out, nl pace, no desat  - PFT's  07/22/2015  FEV1 1.76 (71 % ) ratio 86  p 2 % improvement from saba with  VC 2.02(62%) DLCO  56 % corrects to 93 % for alv volume  - PFT's  01/22/2016  FEV1 1.63 (66 % ) ratio 90  p 16 % improvement from saba p nothing prior to study with VC 1.84(57%)  DLCO  54/53 % corrects to 89 % for alv volume     There has been no significant change in ex tol or pfts vs previous with the caveat that her ex is now more limited by back that breathing and as a result she's really no pushing herself as much in terms of ventilatory demand.  So there has been no convincing evidence of amiodarone toxicity. Patients typically have been on amiodarone for 6-12 months before this complication manifests.  Of note, serial clinical evaluation for symptoms such as cough dyspnea or fevers is  the preferred method of monitoring for pulmonary toxicity because a decrease in DLCO or lung volumes is a nonspecific for toxicity. Pathologically amiodarone pulmonary toxicity may appear as interstitial pneumonitis, eosinophilic pneumonia, organizing pneumonia, pulmonary fibrosis or less commonly as diffuse alveolar hemorrhage, pulmonary nodules or pleural effusions.  Risk factors for pulmonary toxicity include age greater than 60, daily dose greater than equal to 400 mg, a high cumulative dose, or pre-existing lung disease    She has 2/4 risk factors  Discussed in detail all the  indications, usual  risks and alternatives  relative to the benefits with patient who agrees to proceed with conservative f/u as outlined with f/u here q 6 m unless new symptoms or amiodarone is d/cd  I had an extended discussion with the patient reviewing all relevant studies completed to date and  lasting 15 to 20 minutes of a 25 minute  visit    Each maintenance medication was reviewed in detail including most importantly the difference between maintenance and prns and under what circumstances the prns are to be triggered using an action plan format that is not reflected in the computer generated alphabetically organized AVS.    Please see instructions for details which were reviewed in writing and the patient given a copy highlighting the part that I personally wrote and discussed at today's ov.

## 2016-01-29 DIAGNOSIS — R634 Abnormal weight loss: Secondary | ICD-10-CM | POA: Diagnosis not present

## 2016-01-29 DIAGNOSIS — I509 Heart failure, unspecified: Secondary | ICD-10-CM | POA: Diagnosis not present

## 2016-01-29 DIAGNOSIS — Z7901 Long term (current) use of anticoagulants: Secondary | ICD-10-CM | POA: Diagnosis not present

## 2016-01-29 DIAGNOSIS — R600 Localized edema: Secondary | ICD-10-CM | POA: Diagnosis not present

## 2016-01-29 DIAGNOSIS — E1165 Type 2 diabetes mellitus with hyperglycemia: Secondary | ICD-10-CM | POA: Diagnosis not present

## 2016-01-29 DIAGNOSIS — D509 Iron deficiency anemia, unspecified: Secondary | ICD-10-CM | POA: Diagnosis not present

## 2016-01-29 DIAGNOSIS — K3184 Gastroparesis: Secondary | ICD-10-CM | POA: Diagnosis not present

## 2016-01-29 DIAGNOSIS — I482 Chronic atrial fibrillation: Secondary | ICD-10-CM | POA: Diagnosis not present

## 2016-02-02 ENCOUNTER — Ambulatory Visit: Payer: Medicare Other | Admitting: Gastroenterology

## 2016-02-05 ENCOUNTER — Encounter: Payer: Self-pay | Admitting: Gastroenterology

## 2016-02-05 ENCOUNTER — Ambulatory Visit (INDEPENDENT_AMBULATORY_CARE_PROVIDER_SITE_OTHER): Payer: Medicare Other | Admitting: Gastroenterology

## 2016-02-05 VITALS — BP 110/60 | HR 74 | Temp 99.1°F | Ht 66.0 in | Wt 233.8 lb

## 2016-02-05 DIAGNOSIS — K3184 Gastroparesis: Secondary | ICD-10-CM | POA: Diagnosis not present

## 2016-02-05 DIAGNOSIS — K529 Noninfective gastroenteritis and colitis, unspecified: Secondary | ICD-10-CM

## 2016-02-05 MED ORDER — DICYCLOMINE HCL 10 MG PO CAPS: ORAL_CAPSULE | ORAL | 3 refills | Status: DC

## 2016-02-05 MED ORDER — PANTOPRAZOLE SODIUM 40 MG PO TBEC: 40.0000 mg | DELAYED_RELEASE_TABLET | Freq: Two times a day (BID) | ORAL | 3 refills | Status: DC

## 2016-02-05 MED ORDER — ONDANSETRON HCL 4 MG PO TABS: 4.0000 mg | ORAL_TABLET | Freq: Three times a day (TID) | ORAL | 11 refills | Status: DC

## 2016-02-05 MED ORDER — LOPERAMIDE HCL 2 MG PO CAPS: 2.0000 mg | ORAL_CAPSULE | ORAL | 5 refills | Status: DC | PRN

## 2016-02-05 NOTE — Progress Notes (Signed)
Referring Provider: Gareth Morgan, MD Primary Care Physician:  [name], MD  Primary GI: Dr. Darrick Penna   Chief Complaint  Patient presents with  . Follow-up    doing ok    HPI:   [name] is a 65 y.o. female presenting today with a history of chronic abdominal pain, gastroparesis, IBS, history of IDA. EGD in May 2016 due to anemia and history of Barrett's without evidence of Barrett's. Last colonoscopy in May 2015.   Protonix BID is helping instead of Prilosec once daily. Taking Zofran with meals. Now off Marinol and doing well without it. Cost was an issue. Bentyl 1-2 capsules as needed. No adverse side effects with this. Feels like nausea is better controlled. Diarrhea improved with Bentyl.   Dysphagia: has to be careful with salad. BPE completed in interim noted small to moderate sized sliding type hiatal hernia with spontaneous and inducible reflux, nonspecific esophageal dysmotility.   Past Medical History:  Diagnosis Date  . Anemia    H/H of 10/30 with a normal MCV in 12/09  . Barrett's esophagus    Diagnosed 1995. Last EGD 2016-NO BARRETT'S.   . Chest pain    Negative cardiac catheterization in 2002; negative stress nuclear study in 2008  . Chronic anticoagulation   . Chronic diastolic CHF (congestive heart failure) (HCC)    a. EF predominantly normal during prior echoes but was 40% during 10/2014 echo. b. Most recent 01/2015 EF normal, 55-60%.  . Chronic LBP    Surgical intervention in 1996  . Diabetes mellitus, type 2 (HCC)    Insulin therapy; exacerbated by prednisone  . Gastroparesis    99% retention 05/2008 on GES  . GERD (gastroesophageal reflux disease)   . Hiatal hernia   . Hyperlipidemia   . Hypertension   . Hypothyroid   . IBS (irritable bowel syndrome)   . Obesity   . OSA on CPAP   . Paroxysmal atrial fibrillation (HCC)   . Pulmonary hypertension (HCC) 01/2015   a. Predominantly pulmonary venous hypertension but may be component of PAH.    . Seizures (HCC)   . Syncope    a. Admitted 05/2009; magnetic resonance imagin/ MRA - negative; etiology thought to be orthostasis secondary to drugs and dehydration. b. Syncope 02/2015 also felt 2/2 dehydration.    Past Surgical History:  Procedure Laterality Date  . BACK SURGERY  1996  . BIOPSY N/A 11/08/2013   Procedure: BIOPSY  / Tissue sampling / ulcers present in small intestine;  Surgeon: West Bali, MD;  Location: [facility];  Service: Endoscopy;  Laterality: N/A;  . CARDIAC CATHETERIZATION  2002  . CARDIAC CATHETERIZATION N/A 01/26/2015   Procedure: Right Heart Cath;  Surgeon: Laurey Morale, MD;  Location: [facility] INVASIVE CV LAB;  Service: Cardiovascular;  Laterality: N/A;  . CARDIOVASCULAR STRESS TEST  2008   Stress nuclear study  . CARDIOVERSION N/A 03/06/2015   Procedure: CARDIOVERSION;  Surgeon: Laurey Morale, MD;  Location: [facility] ENDOSCOPY;  Service: Cardiovascular;  Laterality: N/A;  . CARPAL TUNNEL RELEASE  1994  . COLONOSCOPY  11/2011   Dr. Darrick Penna: Internal hemorrhoids, mild diverticulosis. Random colon biopsies negative.  . COLONOSCOPY N/A 11/08/2013   SLF: Normal mucosa in the terminal ileum/The colon IS redundant/  Moderate diverticulosis throughout the entire colon. ileum bx benign. colon bx benign  . ESOPHAGOGASTRODUODENOSCOPY  2008   Barrett's without dysplasia. esphagus dilated. antral erosions, h.pylori serologies negative.  . ESOPHAGOGASTRODUODENOSCOPY  11/2011  Dr. Darrick Penna: Barrett's esophagus, mild gastritis, diverticulum in the second portion of the duodenum repeat EGD 3 years. Small bowel biopsies negative. Gastric biopsy show reactive gastropathy but no H. pylori. Esophageal biopsies consistent with GERD. Next EGD 11/2014  . ESOPHAGOGASTRODUODENOSCOPY N/A 11/21/2014   GNO:IBBC non-erosive gastritis/irregular z-line  . GIVENS CAPSULE STUDY  12/07/2011   Proximal small bowel, rare AVM. Distal small bowel, multiple ulcers noted  . GIVENS CAPSULE STUDY N/A 09/27/2013    Distal small bowel ulcers extending to TI.  Marland Kitchen GIVENS CAPSULE STUDY N/A 10/10/2013   Procedure: GIVENS CAPSULE STUDY;  Surgeon: West Bali, MD;  Location: [facility];  Service: Endoscopy;  Laterality: N/A;  7:30  . LAMINECTOMY  1995   L4-L5  . LAPAROSCOPIC CHOLECYSTECTOMY  1990s  . LEFT HEART CATHETERIZATION WITH CORONARY ANGIOGRAM  01/10/2014   Procedure: LEFT HEART CATHETERIZATION WITH CORONARY ANGIOGRAM;  Surgeon: Lesleigh Noe, MD;  Location[facility] C[facility] LAB;  Service: Cardiovascular;;  . RIGHT HEART CATHETERIZATION N/A 01/10/2014   Procedure: RIGHT HEART CATH;  Surgeon: Lesleigh Noe, MD;  Location: [facility] CATH LAB;  Service: Cardiovascular;  Laterality: N/A;  . TOTAL ABDOMINAL HYSTERECTOMY  1999    Current Outpatient Prescriptions  Medication Sig Dispense Refill  . acetaminophen (TYLENOL) 500 MG tablet Take 500 mg by mouth every 6 (six) hours as needed for moderate pain or headache.    Marland Kitchen amiodarone (PACERONE) 200 MG tablet TAKE 1 TABLET BY MOUTH EVERY DAY 30 tablet 1  . apixaban (ELIQUIS) 5 MG TABS tablet Take 5 mg by mouth 2 (two) times daily.    Marland Kitchen dicyclomine (BENTYL) 10 MG capsule 1-2 capsules before meals as needed for abdominal cramping and loose stool 90 capsule 3  . DULoxetine (CYMBALTA) 30 MG capsule Take 30 mg by mouth 2 (two) times daily.    . famotidine (PEPCID) 20 MG tablet Take 20 mg by mouth at bedtime.     . furosemide (LASIX) 40 MG tablet Take 1 tablet (40 mg total) by mouth daily. (Patient taking differently: Take 20 mg by mouth daily. ) 30 tablet 0  . gabapentin (NEURONTIN) 300 MG capsule Take 300 mg by mouth 3 (three) times daily.    Marland Kitchen glipiZIDE (GLUCOTROL) 10 MG tablet Take 10 mg by mouth 2 (two) times daily before a meal.     . HYDROcodone-acetaminophen (NORCO/VICODIN) 5-325 MG tablet Take 1 tablet by mouth every 6 (six) hours as needed for moderate pain. 15 tablet 0  . Insulin Glargine (LANTUS SOLOSTAR) 100 UNIT/ML Solostar Pen Inject 20-50 Units into the  skin at bedtime as needed. Patient takes 20 units every night at bedtime if over 178.  Takes 50 units if her blood sugar is above 300.    . Iron Polysacch Cmplx-B12-FA 150-0.025-1 MG CAPS TAKE 1 TABLET DAILY 30 each 5  . levETIRAcetam (KEPPRA) 750 MG tablet Take 750 mg by mouth 2 (two) times daily.    Marland Kitchen levothyroxine (SYNTHROID, LEVOTHROID) 137 MCG tablet Take 137 mcg by mouth daily.    Marland Kitchen loperamide (IMODIUM) 2 MG capsule TAKE 1 CAPSULE BY MOUTH THREE TIMES DAILY AS NEEDED DIARRHEA OR LOOSE STOOLS  2  . LORazepam (ATIVAN) 1 MG tablet Take 1 mg by mouth 3 (three) times daily as needed for anxiety or sleep. For anxiety    . magnesium oxide (MAG-OX) 400 MG tablet Take 400 mg by mouth daily.     . metaxalone (SKELAXIN) 800 MG tablet Take 800 mg by mouth 3 (  three) times daily.     . metFORMIN (GLUCOPHAGE) 500 MG tablet Take 1,000 mg by mouth 2 (two) times daily with a meal.     . Multiple Vitamin (MULTIVITAMIN WITH MINERALS) TABS tablet Take 1 tablet by mouth daily.    . nitroGLYCERIN (NITROSTAT) 0.4 MG SL tablet Place 0.4 mg under the tongue every 5 (five) minutes as needed for chest pain. Reported on 07/08/2015    . ondansetron (ZOFRAN) 4 MG tablet Take 1 tablet (4 mg total) by mouth 4 (four) times daily -  before meals and at bedtime. As needed for nausea. 120 tablet 3  . pantoprazole (PROTONIX) 40 MG tablet Take 1 tablet (40 mg total) by mouth 2 (two) times daily before a meal. 60 tablet 3  . potassium chloride (K-DUR) 10 MEQ tablet Take 2 tablets (20 mEq total) by mouth 2 (two) times daily. 120 tablet 3  . vitamin B-12 (CYANOCOBALAMIN) 500 MCG tablet Take 500 mcg by mouth daily.     No current facility-administered medications for this visit.     Allergies as of 02/05/2016 - Review Complete 02/05/2016  Allergen Reaction Noted  . Ibuprofen Other (See Comments) 10/13/2013  . Codeine Nausea And Vomiting and Other (See Comments)   . Dilaudid [hydromorphone hcl] Other (See Comments) 12/31/2015  .  Celexa [citalopram hydrobromide] Other (See Comments) 06/30/2013  . Reglan [metoclopramide] Other (See Comments) 06/04/2014    Family History  Problem Relation Age of Onset  . Hypertension Mother   . Alzheimer's disease Mother   . Stroke Mother   . Heart attack Mother   . Heart disease Neg Hx   . Hypertension Other   . Colon cancer Neg Hx   . Breast cancer Sister     Social History   Social History  . Marital status: Married    Spouse name: N/A  . Number of children: N/A  . Years of education: N/A   Occupational History  . 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 Smoking status: Former Smoker    Packs/day: 0.25    Years: 15.00    Types: Cigarettes    Start date: 02/26/1966    Quit date: 07/01/1983  . Smokeless tobacco: Never Used     Comment: quit in 1984  . Alcohol use No     Comment: last etoh one year ago, never frequent  . Drug use: No  . Sexual activity: Yes    Birth control/ protection: None, Surgical   Other Topics Concern  . None   Social History Narrative   Sedentary   4 children, "blended family"    Review of Systems: As mentioned in HPI   Physical Exam: BP 110/60   Pulse 74   Temp 99.1 F (37.3 C) (Oral)   Ht 5\' 6"  (1.676 m)   Wt 233 lb 12.8 oz (106.1 kg)   BMI 37.74 kg/m  General:   Alert and oriented. No distress noted. Pleasant and cooperative.  Head:  Normocephalic and atraumatic. Eyes:  Conjuctiva clear without scleral icterus. Abdomen:  +BS, soft, non-tender and non-distended. No rebound or guarding. No HSM or masses noted. Extremities:  Without edema. Neurologic:  Alert and  oriented x4;  grossly normal neurologically. Psych:  Alert and cooperative. Normal mood and affect.

## 2016-02-05 NOTE — Patient Instructions (Addendum)
I have sent in refills for Protonix twice a day, Bentyl 2 capsules with meals, the Zofran for nausea, and the Imodium once daily.   We will see you in 6 months.   I am glad you are doing well!

## 2016-02-09 ENCOUNTER — Encounter: Payer: Self-pay | Admitting: Cardiology

## 2016-02-09 ENCOUNTER — Ambulatory Visit (INDEPENDENT_AMBULATORY_CARE_PROVIDER_SITE_OTHER): Payer: Medicare Other | Admitting: Cardiology

## 2016-02-09 VITALS — BP 124/64 | HR 78 | Ht 68.0 in | Wt 232.0 lb

## 2016-02-09 DIAGNOSIS — I5032 Chronic diastolic (congestive) heart failure: Secondary | ICD-10-CM

## 2016-02-09 DIAGNOSIS — I480 Paroxysmal atrial fibrillation: Secondary | ICD-10-CM

## 2016-02-09 NOTE — Progress Notes (Signed)
Clinical Summary [name] is a 65 y.o.female seen today for follow up of the following medical problems.   1. Chronicsystolic/ diastolic CHF with mixed precap and post postcap Pulmonary Hypertension - echo 12/2013 PASP 85, moderate TR, RV mild to moderately dilated with normal function, could not eval diasotlic function due to afib, LVEF 50-55%. Biatrial enlargement.  - RHC 01/2015 PA 38/13 and calculated mean of 21, could not get a wedge however LVEDP 15. - repeat echo 10/2014 PASP 61 mmHg (down from 85 on prior echo), some evidence of RV dysfunction with RV TAPSE 1.4 and tissue anular systolic velocity of 7.5. Cannot evaluate diastolic function, however severe biatrial enlarement suggests significant dysfunction, - 01/2015 RHC mean PA 52, PWCP 31 - 01/2015 echo LVEF 55-60%, PASP 46  - after most recent RHC in 01/2015 she was admitted for diuresis. Discharge weight 170 lbs, reportedly down from 190 on admission. Marland Kitchen Discharged on lasix 60mg  bid.    - weights Feb of this year 209. Now up to 232 lbs. Has noted some recent LE edema, R>L which is chronic. - she is taking lasix 20mg  daily. In the past had issues with dehydration and overally aggressive diuretics have been avoided.     2. Afib - no recent palpitations  3. Ascites - followed by GI, recent US 10/2014 with scattered mild ascites without large pocket. 08/2014 CT scan   4. Anemia - she has had previous transfusions over the last 6-8 months. No evidence of active bleeding.  Past Medical History:  Diagnosis Date  . Anemia    H/H of 10/30 with a normal MCV in 12/09  . Barrett's esophagus    Diagnosed 1995. Last EGD 2016-NO BARRETT'S.   . Chest pain    Negative cardiac catheterization in 2002; negative stress nuclear study in 2008  . Chronic anticoagulation   . Chronic diastolic CHF (congestive heart failure) (HCC)    a. EF predominantly normal during prior echoes but was 40% during 10/2014 echo. b. Most recent 01/2015 EF  normal, 55-60%.  . Chronic LBP    Surgical intervention in 1996  . Diabetes mellitus, type 2 (HCC)    Insulin therapy; exacerbated by prednisone  . Gastroparesis    99% retention 05/2008 on GES  . GERD (gastroesophageal reflux disease)   . Hiatal hernia   . Hyperlipidemia   . Hypertension   . Hypothyroid   . IBS (irritable bowel syndrome)   . Obesity   . OSA on CPAP   . Paroxysmal atrial fibrillation (HCC)   . Pulmonary hypertension (HCC) 01/2015   a. Predominantly pulmonary venous hypertension but may be component of PAH.  . Seizures (HCC)   . Syncope    a. Admitted 05/2009; magnetic resonance imagin/ MRA - negative; etiology thought to be orthostasis secondary to drugs and dehydration. b. Syncope 02/2015 also felt 2/2 dehydration.     Allergies  Allergen Reactions  . Ibuprofen Other (See Comments)    Upsets her stomach and causes abdominal pain  . Codeine Nausea And Vomiting and Other (See Comments)    HALLUCINATIONS  . Dilaudid [Hydromorphone Hcl] Other (See Comments)    Made her pass out  . Celexa [Citalopram Hydrobromide] Other (See Comments)    Dyskinesia  . Reglan [Metoclopramide] Other (See Comments)    DYSKINESIA     Current Outpatient Prescriptions  Medication Sig Dispense Refill  . acetaminophen (TYLENOL) 500 MG tablet Take 500 mg by mouth every 6 (six) hours as needed  for moderate pain or headache.    Marland Kitchen amiodarone (PACERONE) 200 MG tablet TAKE 1 TABLET BY MOUTH EVERY DAY 30 tablet 1  . apixaban (ELIQUIS) 5 MG TABS tablet Take 5 mg by mouth 2 (two) times daily.    Marland Kitchen dicyclomine (BENTYL) 10 MG capsule 1-2 capsules before meals as needed for abdominal cramping and loose stool 180 capsule 3  . DULoxetine (CYMBALTA) 30 MG capsule Take 30 mg by mouth 2 (two) times daily.    . famotidine (PEPCID) 20 MG tablet Take 20 mg by mouth at bedtime.     . furosemide (LASIX) 40 MG tablet Take 1 tablet (40 mg total) by mouth daily. (Patient taking differently: Take 20 mg by  mouth daily. ) 30 tablet 0  . gabapentin (NEURONTIN) 300 MG capsule Take 300 mg by mouth 3 (three) times daily.    Marland Kitchen glipiZIDE (GLUCOTROL) 10 MG tablet Take 10 mg by mouth 2 (two) times daily before a meal.     . HYDROcodone-acetaminophen (NORCO/VICODIN) 5-325 MG tablet Take 1 tablet by mouth every 6 (six) hours as needed for moderate pain. 15 tablet 0  . Insulin Glargine (LANTUS SOLOSTAR) 100 UNIT/ML Solostar Pen Inject 20-50 Units into the skin at bedtime as needed. Patient takes 20 units every night at bedtime if over 178.  Takes 50 units if her blood sugar is above 300.    . Iron Polysacch Cmplx-B12-FA 150-0.025-1 MG CAPS TAKE 1 TABLET DAILY 30 each 5  . levETIRAcetam (KEPPRA) 750 MG tablet Take 750 mg by mouth 2 (two) times daily.    Marland Kitchen levothyroxine (SYNTHROID, LEVOTHROID) 137 MCG tablet Take 137 mcg by mouth daily.    Marland Kitchen loperamide (IMODIUM) 2 MG capsule Take 1 capsule (2 mg total) by mouth as needed for diarrhea or loose stools. 30 capsule 5  . LORazepam (ATIVAN) 1 MG tablet Take 1 mg by mouth 3 (three) times daily as needed for anxiety or sleep. For anxiety    . magnesium oxide (MAG-OX) 400 MG tablet Take 400 mg by mouth daily.     . metaxalone (SKELAXIN) 800 MG tablet Take 800 mg by mouth 3 (three) times daily.     . metFORMIN (GLUCOPHAGE) 500 MG tablet Take 1,000 mg by mouth 2 (two) times daily with a meal.     . Multiple Vitamin (MULTIVITAMIN WITH MINERALS) TABS tablet Take 1 tablet by mouth daily.    . nitroGLYCERIN (NITROSTAT) 0.4 MG SL tablet Place 0.4 mg under the tongue every 5 (five) minutes as needed for chest pain. Reported on 07/08/2015    . ondansetron (ZOFRAN) 4 MG tablet Take 1 tablet (4 mg total) by mouth 4 (four) times daily -  before meals and at bedtime. As needed for nausea. 120 tablet 11  . pantoprazole (PROTONIX) 40 MG tablet Take 1 tablet (40 mg total) by mouth 2 (two) times daily before a meal. 180 tablet 3  . potassium chloride (K-DUR) 10 MEQ tablet Take 2 tablets (20  mEq total) by mouth 2 (two) times daily. 120 tablet 3  . vitamin B-12 (CYANOCOBALAMIN) 500 MCG tablet Take 500 mcg by mouth daily.     No current facility-administered medications for this visit.      Past Surgical History:  Procedure Laterality Date  . BACK SURGERY  1996  . BIOPSY N/A 11/08/2013   Procedure: BIOPSY  / Tissue sampling / ulcers present in small intestine;  Surgeon: West Bali, MD;  Location: [facility];  Service: Endoscopy;  Laterality: N/A;  . CARDIAC CATHETERIZATION  2002  . CARDIAC CATHETERIZATION N/A 01/26/2015   Procedure: Right Heart Cath;  Surgeon: Laurey Morale, MD;  Location: [facility] INVASIVE CV LAB;  Service: Cardiovascular;  Laterality: N/A;  . CARDIOVASCULAR STRESS TEST  2008   Stress nuclear study  . CARDIOVERSION N/A 03/06/2015   Procedure: CARDIOVERSION;  Surgeon: Laurey Morale, MD;  Location: [facility] ENDOSCOPY;  Service: Cardiovascular;  Laterality: N/A;  . CARPAL TUNNEL RELEASE  1994  . COLONOSCOPY  11/2011   Dr. Darrick Penna: Internal hemorrhoids, mild diverticulosis. Random colon biopsies negative.  . COLONOSCOPY N/A 11/08/2013   SLF: Normal mucosa in the terminal ileum/The colon IS redundant/  Moderate diverticulosis throughout the entire colon. ileum bx benign. colon bx benign  . ESOPHAGOGASTRODUODENOSCOPY  2008   Barrett's without dysplasia. esphagus dilated. antral erosions, h.pylori serologies negative.  . ESOPHAGOGASTRODUODENOSCOPY  11/2011   Dr. Darrick Penna: Barrett's esophagus, mild gastritis, diverticulum in the second portion of the duodenum repeat EGD 3 years. Small bowel biopsies negative. Gastric biopsy show reactive gastropathy but no H. pylori. Esophageal biopsies consistent with GERD. Next EGD 11/2014  . ESOPHAGOGASTRODUODENOSCOPY N/A 11/21/2014   WUJ:WJXB non-erosive gastritis/irregular z-line  . GIVENS CAPSULE STUDY  12/07/2011   Proximal small bowel, rare AVM. Distal small bowel, multiple ulcers noted  . GIVENS CAPSULE STUDY N/A 09/27/2013   Distal small  bowel ulcers extending to TI.  Marland Kitchen GIVENS CAPSULE STUDY N/A 10/10/2013   Procedure: GIVENS CAPSULE STUDY;  Surgeon: West Bali, MD;  Location: [facility];  Service: Endoscopy;  Laterality: N/A;  7:30  . LAMINECTOMY  1995   L4-L5  . LAPAROSCOPIC CHOLECYSTECTOMY  1990s  . LEFT HEART CATHETERIZATION WITH CORONARY ANGIOGRAM  01/10/2014   Procedure: LEFT HEART CATHETERIZATION WITH CORONARY ANGIOGRAM;  Surgeon: Lesleigh Noe, MD;  Location: [facility] CATH LAB;  Service: Cardiovascular;;  . RIGHT HEART CATHETERIZATION N/A 01/10/2014   Procedure: RIGHT HEART CATH;  Surgeon: Lesleigh Noe, MD;  Location: [facility] CATH LAB;  Service: Cardiovascular;  Laterality: N/A;  . TOTAL ABDOMINAL HYSTERECTOMY  1999     Allergies  Allergen Reactions  . Ibuprofen Other (See Comments)    Upsets her stomach and causes abdominal pain  . Codeine Nausea And Vomiting and Other (See Comments)    HALLUCINATIONS  . Dilaudid [Hydromorphone Hcl] Other (See Comments)    Made her pass out  . Celexa [Citalopram Hydrobromide] Other (See Comments)    Dyskinesia  . Reglan [Metoclopramide] Other (See Comments)    DYSKINESIA      Family History  Problem Relation Age of Onset  . Hypertension Mother   . Alzheimer's disease Mother   . Stroke Mother   . Heart attack Mother   . Hypertension Other   . Breast cancer Sister   . Heart disease Neg Hx   . Colon cancer Neg Hx      Social History [name] reports that she quit smoking about 32 years ago. Her smoking use included Cigarettes. She started smoking about 49 years ago. She has a 3.75 pack-year smoking history. She has never used smokeless tobacco. [name] reports that she does not drink alcohol.   Review of Systems CONSTITUTIONAL: No weight loss, fever, chills, weakness or fatigue.  HEENT: Eyes: No visual loss, blurred vision, double vision or yellow sclerae.No hearing loss, sneezing, congestion, runny nose or sore throat.  SKIN: No rash or itching.    CARDIOVASCULAR: per HPI RESPIRATORY: No shortness of breath, cough or  sputum.  GASTROINTESTINAL: No anorexia, nausea, vomiting or diarrhea. No abdominal pain or blood.  GENITOURINARY: No burning on urination, no polyuria NEUROLOGICAL: No headache, dizziness, syncope, paralysis, ataxia, numbness or tingling in the extremities. No change in bowel or bladder control.  MUSCULOSKELETAL: No muscle, back pain, joint pain or stiffness.  LYMPHATICS: No enlarged nodes. No history of splenectomy.  PSYCHIATRIC: No history of depression or anxiety.  ENDOCRINOLOGIC: No reports of sweating, cold or heat intolerance. No polyuria or polydipsia.  Marland Kitchen   Physical Examination Vitals:   02/09/16 1346  BP: 124/64  Pulse: 78   Filed Weights   02/09/16 1346  Weight: 232 lb (105.2 kg)    Gen: resting comfortably, no acute distress HEENT: no scleral icterus, pupils equal round and reactive, no palptable cervical adenopathy,  CV: RRR, no m/r/g, no jvd Resp: Clear to auscultation bilaterally GI: abdomen is soft, non-tender, non-distended, normal bowel sounds, no hepatosplenomegaly MSK: extremities are warm, no edema.  Skin: warm, no rash Neuro:  no focal deficits Psych: appropriate affect   Diagnostic Studies 12/2013 Echo Study Conclusions  - Limited study to evaluate LV systolic function. - Left ventricle: The cavity size was normal. Wall thickness was normal. Systolic function was normal. The estimated ejection fraction was in the range of 50% to 55%. Wall motion was normal; there were no regional wall motion abnormalities. The study was not technically sufficient to allow evaluation of LV diastolic dysfunction due to atrial fibrillation. - Aortic valve: Valve area (VTI): 2.49 cm^2. Valve area (Vmax): 2.11 cm^2. - Mitral valve: There was moderate regurgitation. The MR vena contracta is 0.4 cm. - Left atrium: The atrium was severely dilated. - Right ventricle: The cavity size was  mildly to moderately dilated. - Right atrium: The atrium was severely dilated. - Atrial septum: From available images no evidence of ASD or PFO by 2D or color Doppler images. - Tricuspid valve: There was moderate regurgitation. - Pulmonary arteries: Systolic pressure was severely increased. PA peak pressure: 85 mm Hg (S).  01/2014 RHC HEMODYNAMICS: Aortic pressure 123/73 mmHg; LV pressure 122/6 mmHg; LVEDP 15 mm mercury; RA 4 mmHg; RV 31/5; PA 38/13 mmHg; PCWP(mean) unable to wedge the balloon tip catheter; Cardiac Output by Fick 4.73 L per minute  ANGIOGRAPHIC DATA: The left main coronary artery is normal.  The left anterior descending artery is widely patent, traverses the LV apex, gives origin to 2 diagonals, and has no significant obstruction..  The left circumflex artery is widely patent and gives a single large obtuse marginal Sinan Tuch that trifurcates on the lateral wall. No significant obstruction.  The right coronary artery is dominant, gives 3 left ventricular branches and a large bifurcating PDA. No obstructions noted.Marland Kitchen  LEFT VENTRICULOGRAM: Left ventricular angiogram was done in the 30 RAO projection and revealed normal LV cavity size, tachycardia due to atrial fibrillation, and mild global reduction in LV function with an estimated ejection fraction of 40%   IMPRESSIONS: 1. Widely patent coronary arteries 2. Mild pulmonary hypertension 3. Mildly depressed LV systolic function with an estimated EF of 40% 3. This did not confirm significant pulmonary hypertension. The procedure did suggest that the right atrium and right ventricle were enlarged as we had difficulty manipulating the right heart catheter into the pulmonary artery.   RECOMMENDATION: Back to the outpatient area at home later today if no complications. She should not resume eloquence until 24 hours post procedure..  10/2014 Echo Study Conclusions  - Left ventricle: The cavity size was  normal.  Wall thickness was increased in a pattern of mild LVH. There was mild concentric hypertrophy. The estimated ejection fraction was 40%. Diffuse hypokinesis. - Mitral valve: Calcified annulus. There was mild regurgitation. - Left atrium: The atrium was severely dilated. - Right atrium: The atrium was severely dilated. - Atrial septum: Thinned and stretched but no obvious PFO. No defect or patent foramen ovale was identified. - Tricuspid valve: There was moderate regurgitation. - Impressions: E/E&' not reported and in afib diastolic evaluation not possible.  Impressions:  - E/E&' not reported and in afib diastolic evaluation not possible.   01/2015 RHC    1. Severely elevated right and left heart filling pressures.  2. Primarily pulmonary venous hypertension (may be additional component of PAH with PVR 4.6 WU).   Plan:  I am going to admit her for IV diuresis.      Right Heart Pressures RHC Procedural Findings: Hemodynamics (mmHg) RA mean 23 RV 68/14 PA 70/36, mean 52 PCWP mean 31  Oxygen saturations: PA 60% AO 98%  Cardiac Output (Fick) 4.54  Cardiac Index (Fick) 2.32  PVR 4.6 WU       Assessment and Plan   1. Chronic systolic/diastolic HF with mixed pulmonary HTN - progression weight gain and edema - she will increase her lasix to 20mg  bid x 3 days, then resume 20mg  daily. Counseled ok to take 20mg  bid as needed for continued swelling or weigh gain.   2. Afib - no recent symptoms. CHADS2Vasc score of 2, continue eliquis.    F/u 3 months     Antoine Poche, M.D., F.A.C.C.

## 2016-02-09 NOTE — Patient Instructions (Signed)
Medication Instructions:  Increase lasix to 20 mg two times daily for the next 3 days - after the three days go back to taking 20 mg daily    Labwork: none  Testing/Procedures: none  Follow-Up: Your physician recommends that you schedule a follow-up appointment in: 3 months    Any Other Special Instructions Will Be Listed Below (If Applicable).     If you need a refill on your cardiac medications before your next appointment, please call your pharmacy.

## 2016-02-10 NOTE — Assessment & Plan Note (Signed)
Doing well with scheduled Zofran, Protonix BID. Return in 6 months.

## 2016-02-10 NOTE — Assessment & Plan Note (Signed)
Continue Bentyl as needed, Imodium prn. 6 month return.

## 2016-02-11 NOTE — Progress Notes (Signed)
cc'ed to pcp °

## 2016-02-12 DIAGNOSIS — D509 Iron deficiency anemia, unspecified: Secondary | ICD-10-CM | POA: Diagnosis not present

## 2016-02-12 DIAGNOSIS — R197 Diarrhea, unspecified: Secondary | ICD-10-CM | POA: Diagnosis not present

## 2016-02-12 DIAGNOSIS — E6609 Other obesity due to excess calories: Secondary | ICD-10-CM | POA: Diagnosis not present

## 2016-02-12 DIAGNOSIS — I952 Hypotension due to drugs: Secondary | ICD-10-CM | POA: Diagnosis not present

## 2016-02-15 ENCOUNTER — Telehealth: Payer: Self-pay

## 2016-02-15 NOTE — Telephone Encounter (Signed)
Vm from [place] at [facility] in reference to a prescription for the pt. I called the pharmacy and Almira Coaster is not in, it is not clear to them why she had called and they will have her call when she gets in today.

## 2016-02-15 NOTE — Telephone Encounter (Signed)
I spoke with Almira Coaster and she said pt thought she was supposed to get more than #30 Imodium tablets. I reviewed Anna's note and told her that Tobi Bastos just wants her to have one Imodium tablet daily. She said that is fine, she just wanted to make sure.

## 2016-02-18 ENCOUNTER — Other Ambulatory Visit: Payer: Self-pay | Admitting: Internal Medicine

## 2016-02-26 DIAGNOSIS — B351 Tinea unguium: Secondary | ICD-10-CM | POA: Diagnosis not present

## 2016-02-26 DIAGNOSIS — E1142 Type 2 diabetes mellitus with diabetic polyneuropathy: Secondary | ICD-10-CM | POA: Diagnosis not present

## 2016-02-26 DIAGNOSIS — L851 Acquired keratosis [keratoderma] palmaris et plantaris: Secondary | ICD-10-CM | POA: Diagnosis not present

## 2016-02-28 IMAGING — CT CT CHEST W/ CM
2 of 3 series · 14 of 36 positions shown, 17 images · IV contrast (omnipaque)
Comparison: DG CHEST 1V PORT dated 10/13/2013;

CLINICAL DATA: Chest pain

EXAM:
CT CHEST WITH CONTRAST
TECHNIQUE: Multidetector CT imaging of the chest was performed during
intravenous contrast administration.
CONTRAST:  80mL OMNIPAQUE IOHEXOL 300 MG/ML  SOLN

[Series 2: chestroutine 5.0 b40f · axial · 0.68mm/px · z∈[-326,-96]mm · 11 of 54 slices shown, 14 images]
[im 4/54  mediastinal]
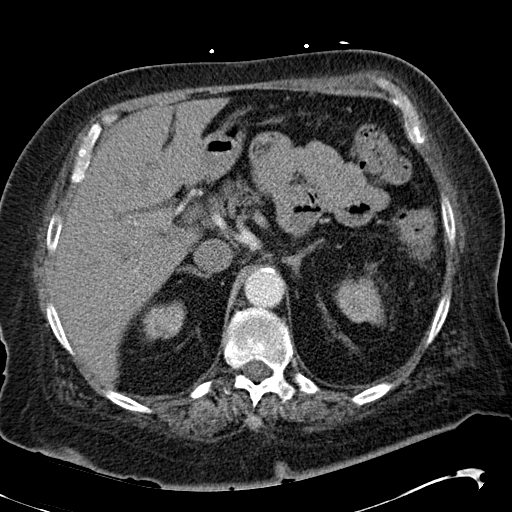
[im 4/54  lung]
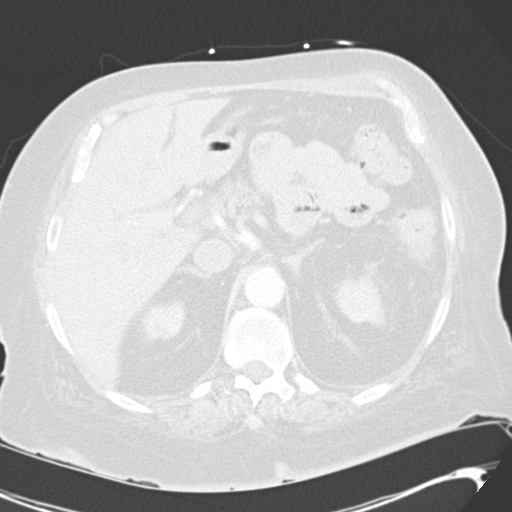
[im 8/54  lung]
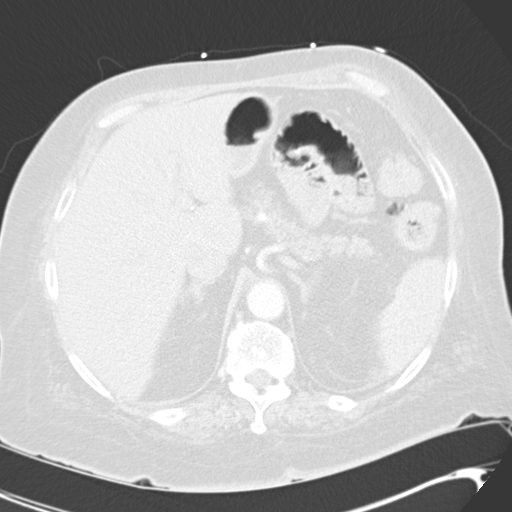
[im 12/54  lung]
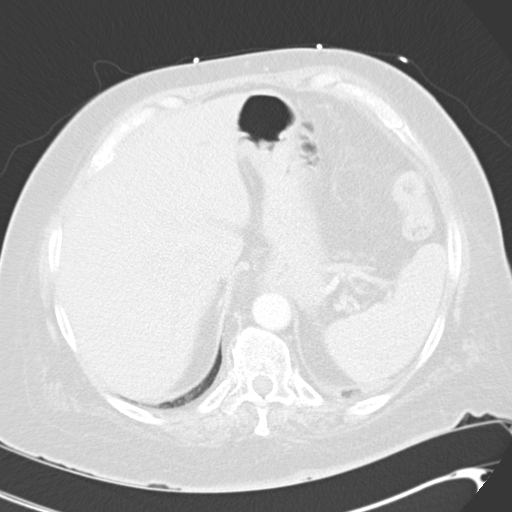
[im 18/54  lung]
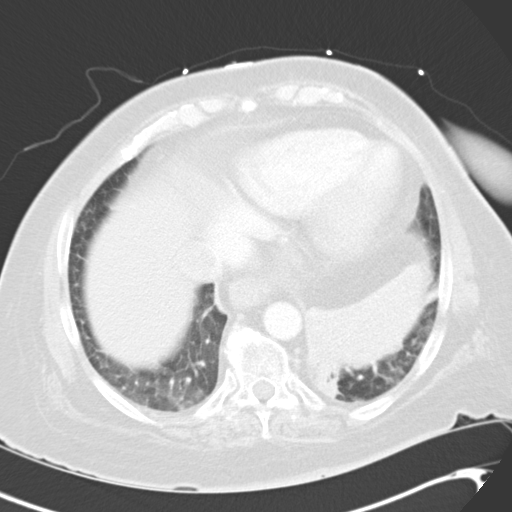
[im 22/54  mediastinal]
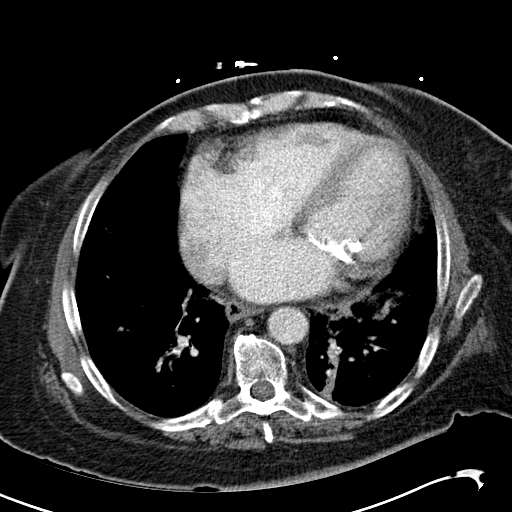
[im 22/54  lung]
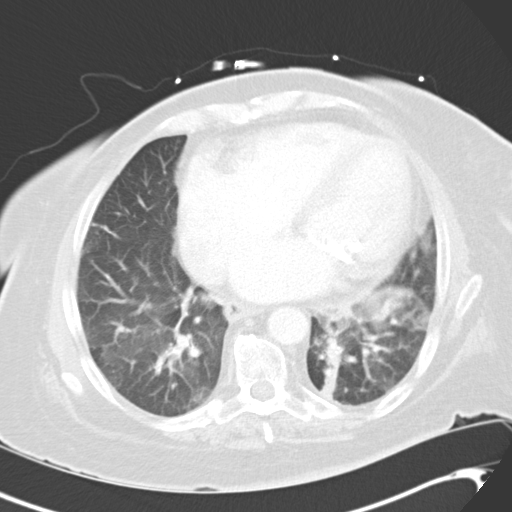
[im 28/54  lung]
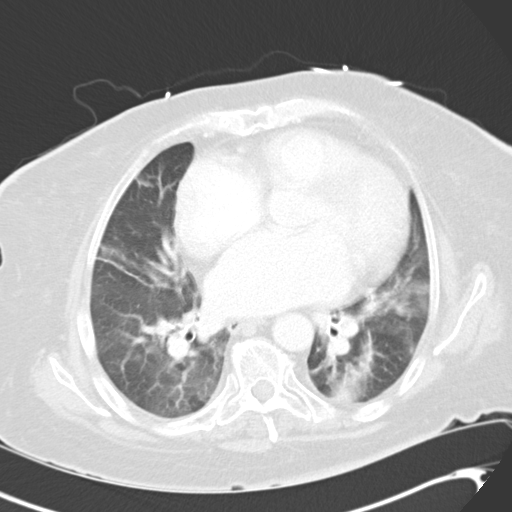
[im 32/54  lung]
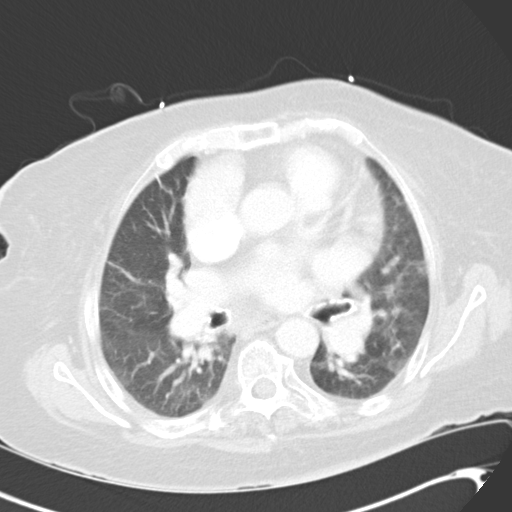
[im 36/54  lung]
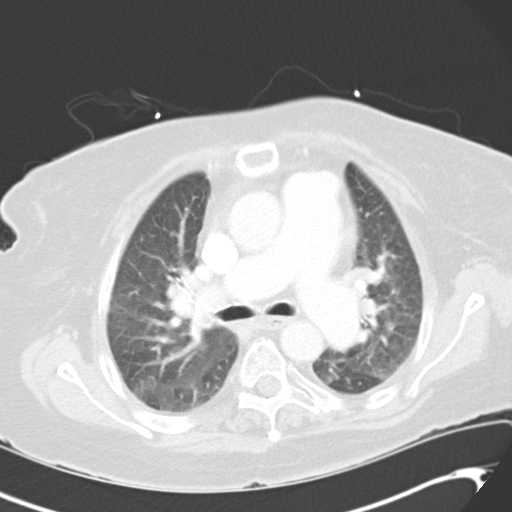
[im 42/54  mediastinal]
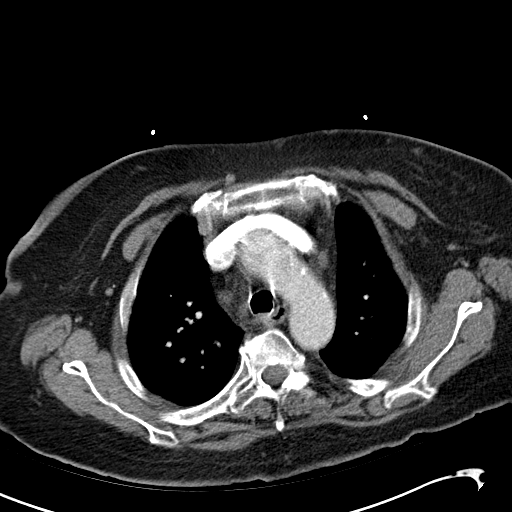
[im 42/54  lung]
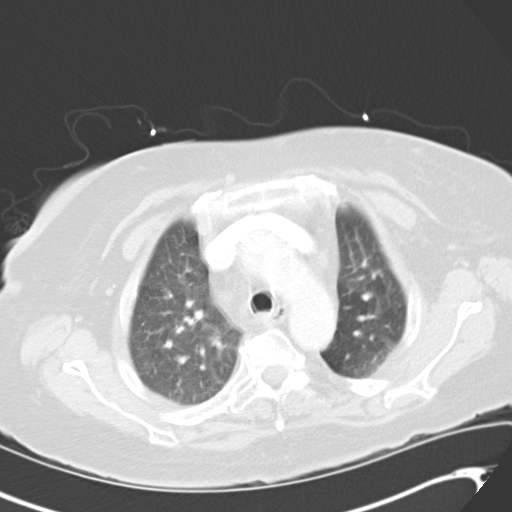
[im 46/54  lung]
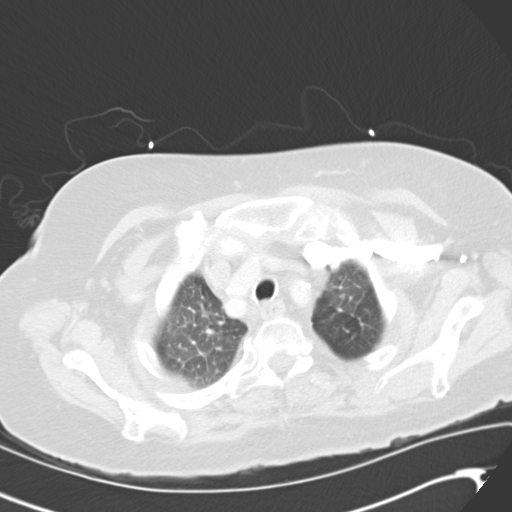
[im 50/54  lung]
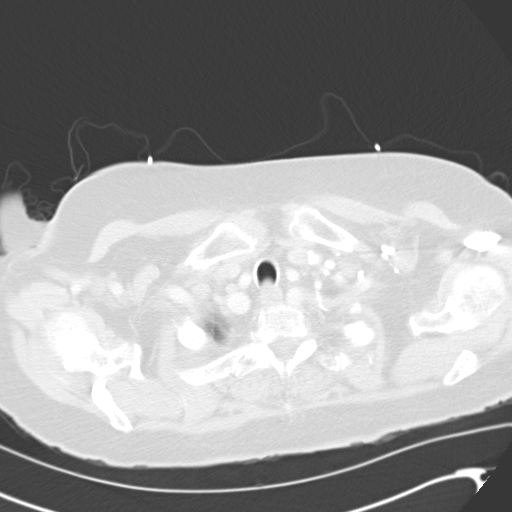

[Series 4: mpr coronal chest 3mm · coronal · 0.54mm/px · 3 of 100 slices shown]
[im 20/100  lung]
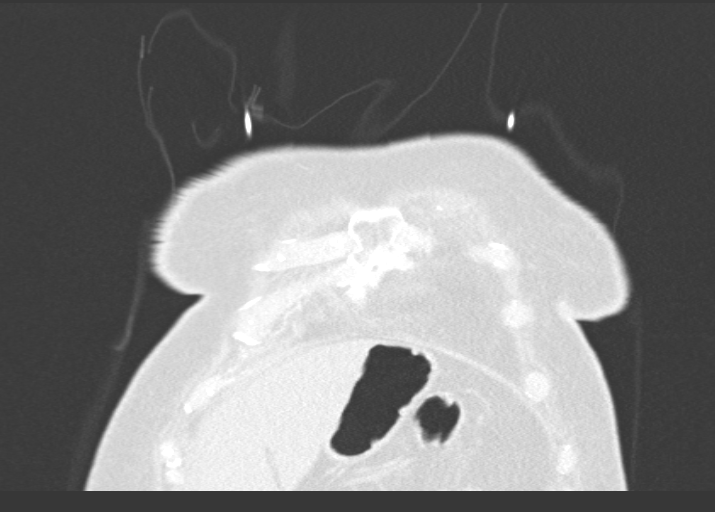
[im 40/100  lung]
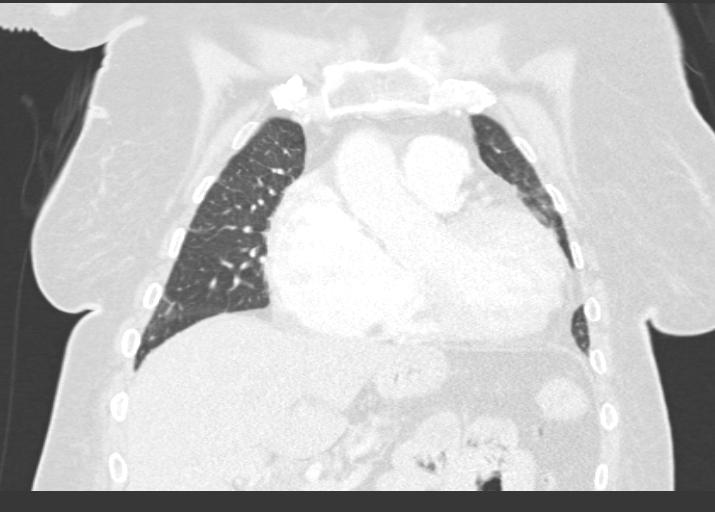
[im 60/100  lung]
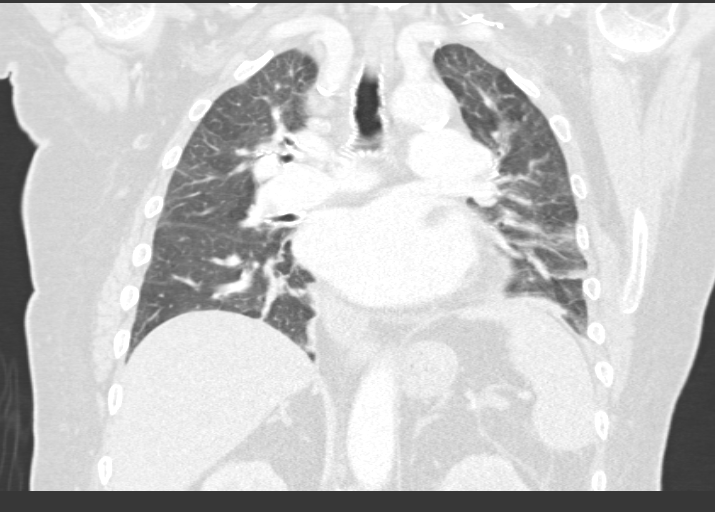

[14 of 36 positions shown; findings below may reference images not displayed]

CT ANGIO CHEST W/CM
&/OR WO/CM dated 06/29/2013; DG CHEST 1V PORT dated 06/29/2013; DG
CHEST 2 VIEW dated 07/08/2013
FINDINGS: Examination is degraded secondary to patient body habitus.

There is minimal subsegmental atelectasis within in the left lower
lobe, lingula and right middle lobe. No discrete focal airspace
opacities. No discrete pulmonary nodules given the limitation of the
examination. There is minimal apical predominant interlobular septal
thickening. No pleural effusion or pneumothorax. The central
pulmonary airways appear widely patent.

No mediastinal, hilar or axillary lymphadenopathy.

Cardiomegaly. Exuberant calcifications within the mitral valve
annulus. No pericardial effusion.

The main pulmonary artery is enlarged, measuring 4.4 cm in diameter
(image 19, series 2). Although this examination was not tailored for
the evaluation of the pulmonary arteries, there are no discrete
filling defects within the pulmonary arterial tree to the level of
the bilateral subsegmental pulmonary arteries to suggest central
pulmonary embolism.

Normal caliber of the thoracic aorta. Bovine configuration of the
aortic arch is incidentally noted. Branch vessels of the arch are
widely patent throughout their imaged course. There is scattered
minimal atherosclerotic plaque within the aortic arch, not resulting
in hemodynamically significant stenosis. No definite aortic
dissection or periaortic stranding on this non CTA examination.

Limited evaluation of the upper abdomen demonstrates a small hiatal
hernia.

No acute or aggressive osseus abnormalities. Stigmata of DISH within
the lower thoracic spine. Regional soft tissues appear normal.
IMPRESSION: 1. Mild pulmonary edema. Otherwise, no explanation for patient's
chest pain. Specifically, no evidence of pneumonia.
2. Cardiomegaly with calcifications of the mitral valve annulus.
Additionally, there is marked enlargement of the caliber of the main
pulmonary artery suggestive of pulmonary arterial hypertension.
Further evaluation with cardiac echo could be performed as
clinically indicated.
3. No evidence of central pulmonary embolism or thoracic aortic
aneurysm/dissection on this non CTA examination.

## 2016-02-28 IMAGING — CR DG CHEST 1V PORT
1 series · 1 of 1 positions shown · non-contrast
Comparison: Chest radiograph 07/08/2013 and CT chest 06/29/2013

CLINICAL DATA: Chest pain

EXAM:
PORTABLE CHEST - 1 VIEW

[portable]
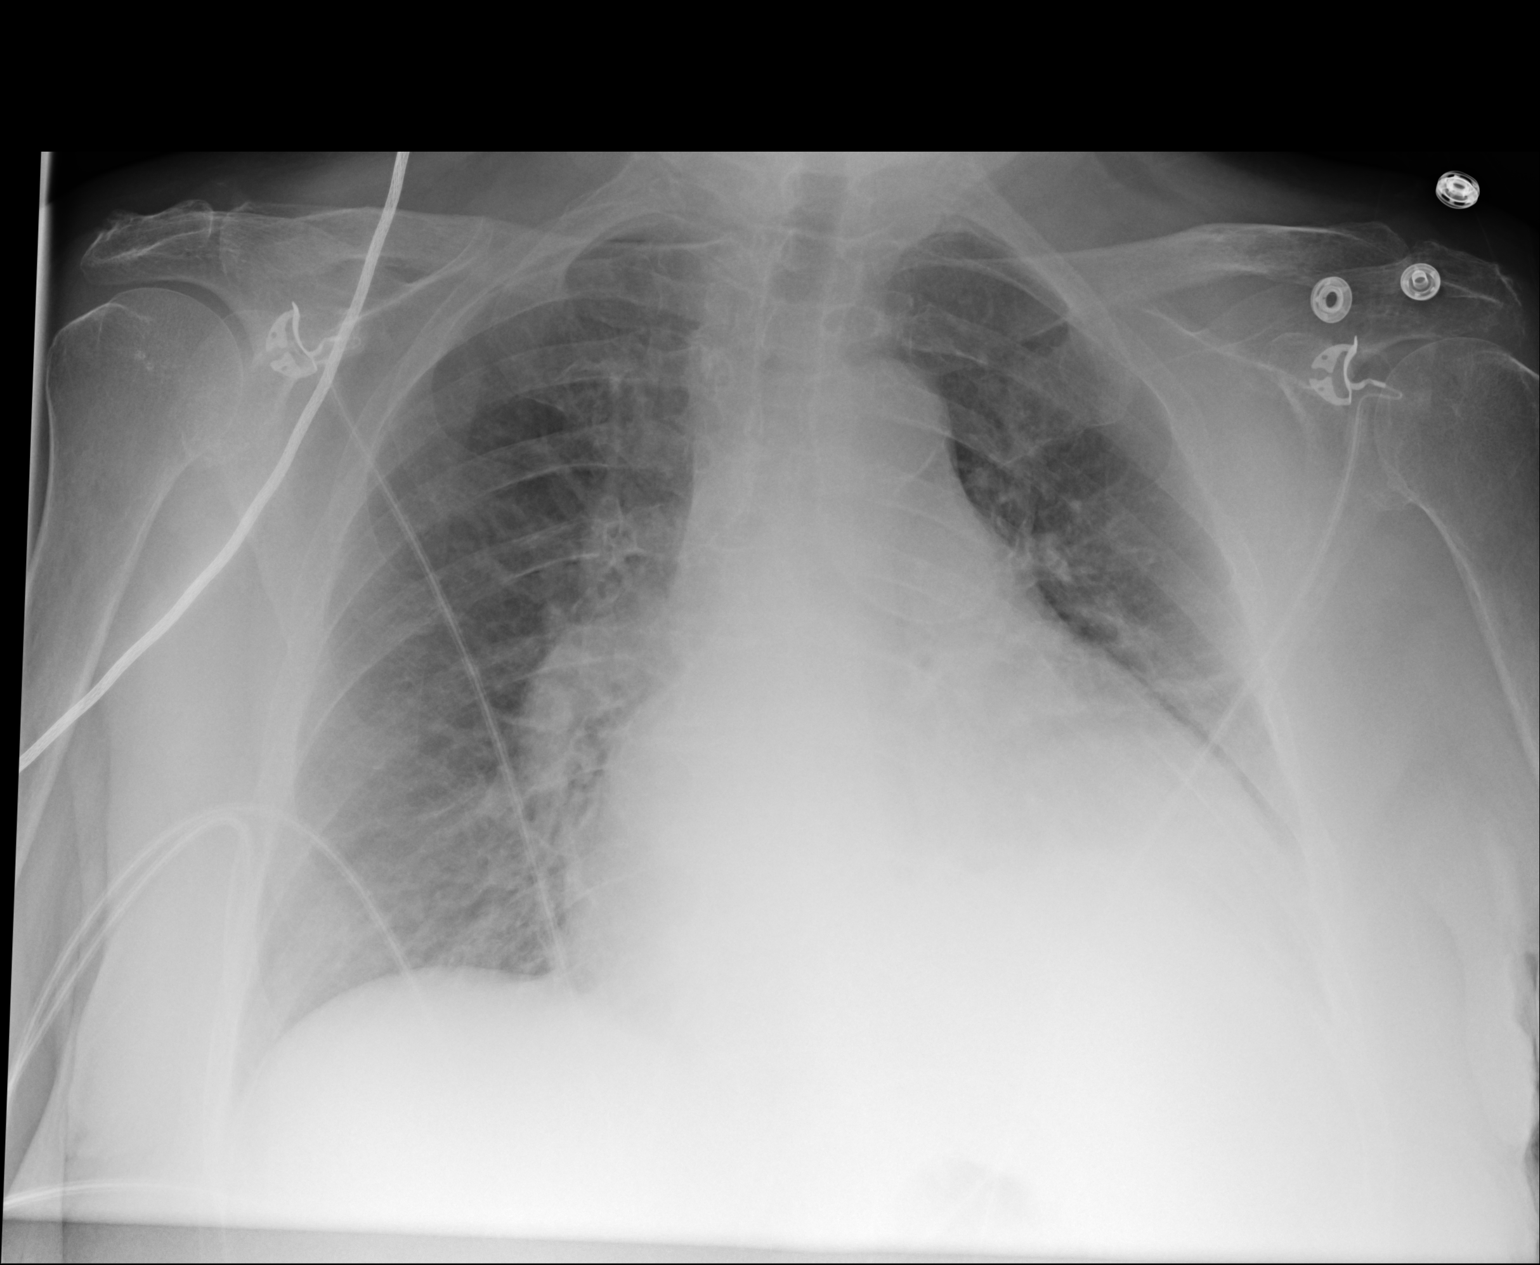

[1 of 1 positions shown; findings below may reference images not displayed]

FINDINGS: Stable cardiac enlargement. Pulmonary vascular congestion discuss
that pulmonary vascularity appears upper normal. There is a left
basilar opacity. The left costophrenic angle is blunted, which may
be in part due to the enlarged heart. Negative for pulmonary edema.
Negative for pneumothorax. No acute osseous abnormality identified.
IMPRESSION: 1. Stable cardiomegaly without failure.
2. Left retrocardiac opacity. Potential etiologies include
atelectasis or airspace disease.
3. Left pleural effusion not excluded by portable technique.

## 2016-03-02 ENCOUNTER — Encounter: Payer: Self-pay | Admitting: Orthopaedic Surgery

## 2016-03-02 ENCOUNTER — Ambulatory Visit (INDEPENDENT_AMBULATORY_CARE_PROVIDER_SITE_OTHER): Payer: Medicare Other

## 2016-03-02 ENCOUNTER — Ambulatory Visit (INDEPENDENT_AMBULATORY_CARE_PROVIDER_SITE_OTHER): Payer: Medicare Other | Admitting: Orthopaedic Surgery

## 2016-03-02 VITALS — BP 113/68 | HR 60 | Temp 97.3°F | Ht 66.0 in | Wt 237.0 lb

## 2016-03-02 DIAGNOSIS — M79622 Pain in left upper arm: Secondary | ICD-10-CM

## 2016-03-02 DIAGNOSIS — M25561 Pain in right knee: Secondary | ICD-10-CM

## 2016-03-02 NOTE — Progress Notes (Signed)
Subjective:  My left knee and my right arm hurt    Patient ID: [name], female    DOB: [birth year], 65 y.o.   MRN: [id number]  HPI She had a fall last June, 2016 and hurt her right proximal fibula with a nondisplaced fracture.  Since then she has had right knee pain with swelling, popping.  More recently she has had giving way of the right knee and more swelling.  She feels it might buckle at anytime. She uses a cane now to catch herself from falling.  She has no redness.  She has tried ice, heat, rest, cane and Advil with little help.  She has no numbness.  She fell in June of this year hitting her left shoulder.  She had x-rays done which were negative.  However, she has pain of the mid left humerus shaft area that does not go away.  She has no weakness, no swelling and no redness.   Review of Systems  HENT: Negative for congestion.   Respiratory: Negative for cough and shortness of breath.   Cardiovascular: Negative for chest pain and leg swelling.  Endocrine: Positive for cold intolerance.  Musculoskeletal: Positive for arthralgias, gait problem and joint swelling.  Allergic/Immunologic: Positive for environmental allergies.   Past Medical History:  Diagnosis Date  . Anemia    H/H of 10/30 with a normal MCV in 12/09  . Barrett's esophagus    Diagnosed 1995. Last EGD 2016-NO BARRETT'S.   . Chest pain    Negative cardiac catheterization in 2002; negative stress nuclear study in 2008  . Chronic anticoagulation   . Chronic diastolic CHF (congestive heart failure) (HCC)    a. EF predominantly normal during prior echoes but was 40% during 10/2014 echo. b. Most recent 01/2015 EF normal, 55-60%.  . Chronic LBP    Surgical intervention in 1996  . Diabetes mellitus, type 2 (HCC)    Insulin therapy; exacerbated by prednisone  . Gastroparesis    99% retention 05/2008 on GES  . GERD (gastroesophageal reflux disease)   . Hiatal hernia   . Hyperlipidemia   . Hypertension   .  Hypothyroid   . IBS (irritable bowel syndrome)   . Obesity   . OSA on CPAP   . Paroxysmal atrial fibrillation (HCC)   . Pulmonary hypertension (HCC) 01/2015   a. Predominantly pulmonary venous hypertension but may be component of PAH.  . Seizures (HCC)   . Syncope    a. Admitted 05/2009; magnetic resonance imagin/ MRA - negative; etiology thought to be orthostasis secondary to drugs and dehydration. b. Syncope 02/2015 also felt 2/2 dehydration.    Past Surgical History:  Procedure Laterality Date  . BACK SURGERY  1996  . BIOPSY N/A 11/08/2013   Procedure: BIOPSY  / Tissue sampling / ulcers present in small intestine;  Surgeon: West Bali, MD;  Location: [facility];  Service: Endoscopy;  Laterality: N/A;  . CARDIAC CATHETERIZATION  2002  . CARDIAC CATHETERIZATION N/A 01/26/2015   Procedure: Right Heart Cath;  Surgeon: Laurey Morale, MD;  Location: [facility] INVASIVE CV LAB;  Service: Cardiovascular;  Laterality: N/A;  . CARDIOVASCULAR STRESS TEST  2008   Stress nuclear study  . CARDIOVERSION N/A 03/06/2015   Procedure: CARDIOVERSION;  Surgeon: Laurey Morale, MD;  Location: [facility] ENDOSCOPY;  Service: Cardiovascular;  Laterality: N/A;  . CARPAL TUNNEL RELEASE  1994  . COLONOSCOPY  11/2011   Dr. Darrick Penna: Internal hemorrhoids, mild diverticulosis. Random colon biopsies negative.  Marland Kitchen  COLONOSCOPY N/A 11/08/2013   SLF: Normal mucosa in the terminal ileum/The colon IS redundant/  Moderate diverticulosis throughout the entire colon. ileum bx benign. colon bx benign  . ESOPHAGOGASTRODUODENOSCOPY  2008   Barrett's without dysplasia. esphagus dilated. antral erosions, h.pylori serologies negative.  . ESOPHAGOGASTRODUODENOSCOPY  11/2011   Dr. Darrick Penna: Barrett's esophagus, mild gastritis, diverticulum in the second portion of the duodenum repeat EGD 3 years. Small bowel biopsies negative. Gastric biopsy show reactive gastropathy but no H. pylori. Esophageal biopsies consistent with GERD. Next EGD 11/2014  .  ESOPHAGOGASTRODUODENOSCOPY N/A 11/21/2014   WUJ:WJXB non-erosive gastritis/irregular z-line  . GIVENS CAPSULE STUDY  12/07/2011   Proximal small bowel, rare AVM. Distal small bowel, multiple ulcers noted  . GIVENS CAPSULE STUDY N/A 09/27/2013   Distal small bowel ulcers extending to TI.  Marland Kitchen GIVENS CAPSULE STUDY N/A 10/10/2013   Procedure: GIVENS CAPSULE STUDY;  Surgeon: West Bali, MD;  Location: [facility];  Service: Endoscopy;  Laterality: N/A;  7:30  . LAMINECTOMY  1995   L4-L5  . LAPAROSCOPIC CHOLECYSTECTOMY  1990s  . LEFT HEART CATHETERIZATION WITH CORONARY ANGIOGRAM  01/10/2014   Procedure: LEFT HEART CATHETERIZATION WITH CORONARY ANGIOGRAM;  Surgeon: Lesleigh Noe, MD;  Location: [facility] CATH LAB;  Service: Cardiovascular;;  . RIGHT HEART CATHETERIZATION N/A 01/10/2014   Procedure: RIGHT HEART CATH;  Surgeon: Lesleigh Noe, MD;  Location: [facility] CATH LAB;  Service: Cardiovascular;  Laterality: N/A;  . TOTAL ABDOMINAL HYSTERECTOMY  1999    Current Outpatient Prescriptions on File Prior to Visit  Medication Sig Dispense Refill  . acetaminophen (TYLENOL) 500 MG tablet Take 500 mg by mouth every 6 (six) hours as needed for moderate pain or headache.    Marland Kitchen amiodarone (PACERONE) 200 MG tablet TAKE 1 TABLET BY MOUTH EVERY DAY 30 tablet 1  . apixaban (ELIQUIS) 5 MG TABS tablet Take 5 mg by mouth 2 (two) times daily.    Marland Kitchen dicyclomine (BENTYL) 10 MG capsule 1-2 capsules before meals as needed for abdominal cramping and loose stool 180 capsule 3  . DULoxetine (CYMBALTA) 30 MG capsule Take 30 mg by mouth 2 (two) times daily.    . famotidine (PEPCID) 20 MG tablet Take 20 mg by mouth at bedtime.     . furosemide (LASIX) 40 MG tablet Take 1 tablet (40 mg total) by mouth daily. (Patient taking differently: Take 20 mg by mouth daily. ) 30 tablet 0  . gabapentin (NEURONTIN) 300 MG capsule Take 300 mg by mouth 3 (three) times daily.    Marland Kitchen glipiZIDE (GLUCOTROL) 10 MG tablet Take 10 mg by mouth 2 (two)  times daily before a meal.     . HYDROcodone-acetaminophen (NORCO/VICODIN) 5-325 MG tablet Take 1 tablet by mouth every 6 (six) hours as needed for moderate pain. 15 tablet 0  . Insulin Glargine (LANTUS SOLOSTAR) 100 UNIT/ML Solostar Pen Inject 20-50 Units into the skin at bedtime as needed. Patient takes 20 units every night at bedtime if over 178.  Takes 50 units if her blood sugar is above 300.    . Iron Polysacch Cmplx-B12-FA 150-0.025-1 MG CAPS TAKE 1 TABLET DAILY 30 each 5  . levETIRAcetam (KEPPRA) 750 MG tablet Take 750 mg by mouth 2 (two) times daily.    Marland Kitchen levothyroxine (SYNTHROID, LEVOTHROID) 137 MCG tablet Take 137 mcg by mouth daily.    Marland Kitchen loperamide (IMODIUM) 2 MG capsule Take 1 capsule (2 mg total) by mouth as needed for diarrhea or loose stools.  30 capsule 5  . LORazepam (ATIVAN) 1 MG tablet Take 1 mg by mouth 3 (three) times daily as needed for anxiety or sleep. For anxiety    . magnesium oxide (MAG-OX) 400 MG tablet Take 400 mg by mouth daily.     . metaxalone (SKELAXIN) 800 MG tablet Take 800 mg by mouth 3 (three) times daily.     . metFORMIN (GLUCOPHAGE) 500 MG tablet Take 1,000 mg by mouth 2 (two) times daily with a meal.     . Multiple Vitamin (MULTIVITAMIN WITH MINERALS) TABS tablet Take 1 tablet by mouth daily.    . nitroGLYCERIN (NITROSTAT) 0.4 MG SL tablet Place 0.4 mg under the tongue every 5 (five) minutes as needed for chest pain. Reported on 07/08/2015    . ondansetron (ZOFRAN) 4 MG tablet Take 1 tablet (4 mg total) by mouth 4 (four) times daily -  before meals and at bedtime. As needed for nausea. 120 tablet 11  . pantoprazole (PROTONIX) 40 MG tablet Take 1 tablet (40 mg total) by mouth 2 (two) times daily before a meal. 180 tablet 3  . potassium chloride (K-DUR) 10 MEQ tablet TAKE 2 TABLETS BY MOUTH TWICE DAILY FOR LOW POTASSIUM 120 tablet 3  . vitamin B-12 (CYANOCOBALAMIN) 500 MCG tablet Take 500 mcg by mouth daily.     No current facility-administered medications on  file prior to visit.     Social History   Social History  . Marital status: Married    Spouse name: N/A  . Number of children: N/A  . Years of education: N/A   Occupational History  . 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 Smoking status: Former Smoker    Packs/day: 0.25    Years: 15.00    Types: Cigarettes    Start date: 02/26/1966    Quit date: 07/01/1983  . Smokeless tobacco: Never Used     Comment: quit in 1984  . Alcohol use No     Comment: last etoh one year ago, never frequent  . Drug use: No  . Sexual activity: Yes    Birth control/ protection: None, Surgical   Other Topics Concern  . Not on file   Social History Narrative   Sedentary   4 children, "blended family"    Family History  Problem Relation Age of Onset  . Hypertension Mother   . Alzheimer's disease Mother   . Stroke Mother   . Heart attack Mother   . Hypertension Other   . Breast cancer Sister   . Heart disease Neg Hx   . Colon cancer Neg Hx     BP 113/68   Pulse 60   Temp 97.3 F (36.3 C)   Ht 5\' 6"  (1.676 m)   Wt 237 lb (107.5 kg)   BMI 38.25 kg/m      Objective:   Physical Exam  Constitutional: She is oriented to person, place, and time. She appears well-developed and well-nourished.  HENT:  Head: Normocephalic and atraumatic.  Eyes: Conjunctivae and EOM are normal. Pupils are equal, round, and reactive to light.  Neck: Normal range of motion. Neck supple.  Cardiovascular: Normal rate, regular rhythm and intact distal pulses.   Pulmonary/Chest: Effort normal.  Abdominal: Soft.  Musculoskeletal: She exhibits tenderness (Pain right knee, more medial, effusion, crepitus, ROM 0 to 105, + medial McMurray.  Left knee negative.  Left humerus tender laterally mid shaft no  swelling, redness, mass, defect.).  Neurological: She is alert and oriented to person, place, and time. She displays normal reflexes. No cranial nerve deficit. She  exhibits normal muscle tone. Coordination normal.  Skin: Skin is warm and dry.  Psychiatric: She has a normal mood and affect. Her behavior is normal. Judgment and thought content normal.   X-rays were done of the right knee and left humerus shaft, reported separately.       Assessment & Plan:   Encounter Diagnoses  Name Primary?  . Right knee pain Yes  . Left upper arm pain   . Morbid obesity due to excess calories (HCC)    PROCEDURE NOTE:  The patient requests injections of the right knee , verbal consent was obtained.  The right knee was prepped appropriately after time out was performed.   Sterile technique was observed and injection of 1 cc of Depo-Medrol 40 mg with several cc's of plain xylocaine. Anesthesia was provided by ethyl chloride and a 20-gauge needle was used to inject the knee area. The injection was tolerated well.  A band aid dressing was applied.  The patient was advised to apply ice later today and tomorrow to the injection sight as needed.  I would like to get MRI of the right knee as she has had continued pain and swelling for a period of time and not improved with conservative treatment.  She has giving way.  She has instability and positive medial McMurray.  I am concerned about a meniscus tear.  Return after MRI.  Call if any problem.  Precautions discussed.  Electronically Signed Darreld Mclean, MD 8/30/20173:33 PM

## 2016-03-11 ENCOUNTER — Encounter (HOSPITAL_COMMUNITY): Payer: Self-pay

## 2016-03-11 ENCOUNTER — Ambulatory Visit (HOSPITAL_COMMUNITY)
Admission: RE | Admit: 2016-03-11 | Discharge: 2016-03-11 | Disposition: A | Discharge status: Home / Self Care | Payer: Medicare Other | Source: Ambulatory Visit | Attending: Orthopaedic Surgery | Admitting: Orthopaedic Surgery

## 2016-03-11 DIAGNOSIS — M25561 Pain in right knee: Secondary | ICD-10-CM

## 2016-03-15 ENCOUNTER — Ambulatory Visit: Payer: Medicare Other | Admitting: Orthopaedic Surgery

## 2016-03-18 ENCOUNTER — Ambulatory Visit (HOSPITAL_COMMUNITY)
Admission: RE | Admit: 2016-03-18 | Discharge: 2016-03-18 | Disposition: A | Discharge status: Home / Self Care | Payer: Medicare Other | Source: Ambulatory Visit | Attending: Orthopaedic Surgery | Admitting: Orthopaedic Surgery

## 2016-03-18 DIAGNOSIS — M25561 Pain in right knee: Secondary | ICD-10-CM | POA: Diagnosis present

## 2016-03-18 DIAGNOSIS — S83231A Complex tear of medial meniscus, current injury, right knee, initial encounter: Secondary | ICD-10-CM | POA: Insufficient documentation

## 2016-03-18 DIAGNOSIS — S83271A Complex tear of lateral meniscus, current injury, right knee, initial encounter: Secondary | ICD-10-CM | POA: Insufficient documentation

## 2016-03-18 DIAGNOSIS — M23041 Cystic meniscus, anterior horn of lateral meniscus, right knee: Secondary | ICD-10-CM | POA: Insufficient documentation

## 2016-03-21 ENCOUNTER — Other Ambulatory Visit: Payer: Self-pay | Admitting: Cardiology

## 2016-03-22 ENCOUNTER — Encounter: Payer: Self-pay | Admitting: Orthopaedic Surgery

## 2016-03-22 ENCOUNTER — Ambulatory Visit (INDEPENDENT_AMBULATORY_CARE_PROVIDER_SITE_OTHER): Payer: Medicare Other | Admitting: Orthopaedic Surgery

## 2016-03-22 VITALS — BP 114/68 | HR 97 | Temp 97.2°F | Ht 66.0 in | Wt 238.0 lb

## 2016-03-22 DIAGNOSIS — M25561 Pain in right knee: Secondary | ICD-10-CM | POA: Diagnosis not present

## 2016-03-22 NOTE — Progress Notes (Signed)
Patient ZO:XWRU:[name], female DOB:05-Sep-1950, 65 y.o. EAV:[id number]  Chief Complaint  Patient presents with  . Follow-up    Right knee pain MRI results    HPI  Tara KrebsMona H [name] is a 65 y.o. female who has had pain of the right knee.  She had a MRI of the knee which shows: IMPRESSION: 1. Grade 3 oblique tear anterior horn lateral meniscus involving the superior surface and meniscal periphery, with a 1 cc parameniscal cyst tracking along the periphery. There is also horizontal grade 3 signal in the anterior horn and midbody of the lateral meniscus extending to the free edge. 2. Subtle oblique grade 3 tear involving the superior surface of the posterior horn medial meniscus. 3. Tricompartmental moderate degenerative chondral thinning. 4. Despite efforts by the technologist and patient, motion artifact is present on today's exam and could not be eliminated. This reduces exam sensitivity and specificity.  I have explained the findings to her.  She is a candidate for knee arthroscopy.  She has history of heart problems and CHF.  She will need medical clearance first.  Then she can see Dr. Romeo AppleHarrison for discussion of possible surgery.  She understands this. She and her daughter who was present asked appropriate questions.  HPI  Body mass index is 38.41 kg/m.  ROS  Review of Systems  HENT: Negative for congestion.   Respiratory: Negative for cough and shortness of breath.   Cardiovascular: Negative for chest pain and leg swelling.  Endocrine: Positive for cold intolerance.  Musculoskeletal: Positive for arthralgias, gait problem and joint swelling.  Allergic/Immunologic: Positive for environmental allergies.    Past Medical History:  Diagnosis Date  . Anemia    H/H of 10/30 with a normal MCV in 12/09  . Barrett's esophagus    Diagnosed 1995. Last EGD 2016-NO BARRETT'S.   . Chest pain    Negative cardiac catheterization in 2002; negative stress nuclear study in 2008  .  Chronic anticoagulation   . Chronic diastolic CHF (congestive heart failure) (HCC)    a. EF predominantly normal during prior echoes but was 40% during 10/2014 echo. b. Most recent 01/2015 EF normal, 55-60%.  . Chronic LBP    Surgical intervention in 1996  . Diabetes mellitus, type 2 (HCC)    Insulin therapy; exacerbated by prednisone  . Gastroparesis    99% retention 05/2008 on GES  . GERD (gastroesophageal reflux disease)   . Hiatal hernia   . Hyperlipidemia   . Hypertension   . Hypothyroid   . IBS (irritable bowel syndrome)   . Obesity   . OSA on CPAP   . Paroxysmal atrial fibrillation (HCC)   . Pulmonary hypertension (HCC) 01/2015   a. Predominantly pulmonary venous hypertension but may be component of PAH.  . Seizures (HCC)   . Syncope    a. Admitted 05/2009; magnetic resonance imagin/ MRA - negative; etiology thought to be orthostasis secondary to drugs and dehydration. b. Syncope 02/2015 also felt 2/2 dehydration.    Past Surgical History:  Procedure Laterality Date  . BACK SURGERY  1996  . BIOPSY N/A 11/08/2013   Procedure: BIOPSY  / Tissue sampling / ulcers present in small intestine;  Surgeon: West BaliSandi L Fields, MD;  Location: [facility];  Service: Endoscopy;  Laterality: N/A;  . CARDIAC CATHETERIZATION  2002  . CARDIAC CATHETERIZATION N/A 01/26/2015   Procedure: Right Heart Cath;  Surgeon: Laurey Moralealton S McLean, MD;  [facility] INVASIVE CV LAB;  Service: Cardiovascular;  Laterality: N/A;  .  CARDIOVASCULAR STRESS TEST  2008   Stress nuclear study  . CARDIOVERSION N/A 03/06/2015   Procedure: CARDIOVERSION;  Surgeon: Laurey Morale, MD;  Location: [facility] ENDOSCOPY;  Service: Cardiovascular;  Laterality: N/A;  . CARPAL TUNNEL RELEASE  1994  . COLONOSCOPY  11/2011   Dr. Darrick Penna: Internal hemorrhoids, mild diverticulosis. Random colon biopsies negative.  . COLONOSCOPY N/A 11/08/2013   SLF: Normal mucosa in the terminal ileum/The colon IS redundant/  Moderate diverticulosis throughout the  entire colon. ileum bx benign. colon bx benign  . ESOPHAGOGASTRODUODENOSCOPY  2008   Barrett's without dysplasia. esphagus dilated. antral erosions, h.pylori serologies negative.  . ESOPHAGOGASTRODUODENOSCOPY  11/2011   Dr. Darrick Penna: Barrett's esophagus, mild gastritis, diverticulum in the second portion of the duodenum repeat EGD 3 years. Small bowel biopsies negative. Gastric biopsy show reactive gastropathy but no H. pylori. Esophageal biopsies consistent with GERD. Next EGD 11/2014  . ESOPHAGOGASTRODUODENOSCOPY N/A 11/21/2014   RUE:AVWU non-erosive gastritis/irregular z-line  . GIVENS CAPSULE STUDY  12/07/2011   Proximal small bowel, rare AVM. Distal small bowel, multiple ulcers noted  . GIVENS CAPSULE STUDY N/A 09/27/2013   Distal small bowel ulcers extending to TI.  Marland Kitchen GIVENS CAPSULE STUDY N/A 10/10/2013   Procedure: GIVENS CAPSULE STUDY;  Surgeon: West Bali, MD;  Location: [facility];  Service: Endoscopy;  Laterality: N/A;  7:30  . LAMINECTOMY  1995   L4-L5  . LAPAROSCOPIC CHOLECYSTECTOMY  1990s  . LEFT HEART CATHETERIZATION WITH CORONARY ANGIOGRAM  01/10/2014   Procedure: LEFT HEART CATHETERIZATION WITH CORONARY ANGIOGRAM;  Surgeon: Lesleigh Noe, MD;  Location: [facility] CATH LAB;  Service: Cardiovascular;;  . RIGHT HEART CATHETERIZATION N/A 01/10/2014   Procedure: RIGHT HEART CATH;  Surgeon: Lesleigh Noe, MD;  Location: [facility] CATH LAB;  Service: Cardiovascular;  Laterality: N/A;  . TOTAL ABDOMINAL HYSTERECTOMY  1999    Family History  Problem Relation Age of Onset  . Hypertension Mother   . Alzheimer's disease Mother   . Stroke Mother   . Heart attack Mother   . Hypertension Other   . Breast cancer Sister   . Heart disease Neg Hx   . Colon cancer Neg Hx     Social History Social History  Substance Use Topics  . Smoking status: Former Smoker    Packs/day: 0.25    Years: 15.00    Types: Cigarettes    Start date: 02/26/1966    Quit date: 07/01/1983  . Smokeless tobacco:  Never Used     Comment: quit in 1984  . Alcohol use No     Comment: last etoh one year ago, never frequent    Allergies  Allergen Reactions  . Ibuprofen Other (See Comments)    Upsets her stomach and causes abdominal pain  . Codeine Nausea And Vomiting and Other (See Comments)    HALLUCINATIONS  . Dilaudid [Hydromorphone Hcl] Other (See Comments)    Made her pass out  . Celexa [Citalopram Hydrobromide] Other (See Comments)    Dyskinesia  . Reglan [Metoclopramide] Other (See Comments)    DYSKINESIA    Current Outpatient Prescriptions  Medication Sig Dispense Refill  . acetaminophen (TYLENOL) 500 MG tablet Take 500 mg by mouth every 6 (six) hours as needed for moderate pain or headache.    Marland Kitchen amiodarone (PACERONE) 200 MG tablet TAKE 1 TABLET BY MOUTH EVERY DAY 30 tablet 1  . apixaban (ELIQUIS) 5 MG TABS tablet Take 5 mg by mouth 2 (two) times daily.    Marland Kitchen  dicyclomine (BENTYL) 10 MG capsule 1-2 capsules before meals as needed for abdominal cramping and loose stool 180 capsule 3  . dronabinol (MARINOL) 2.5 MG capsule Take 2.5 mg by mouth 2 (two) times daily before a meal.    . DULoxetine (CYMBALTA) 30 MG capsule Take 30 mg by mouth 2 (two) times daily.    . famotidine (PEPCID) 20 MG tablet Take 20 mg by mouth at bedtime.     . furosemide (LASIX) 40 MG tablet Take 1 tablet (40 mg total) by mouth daily. (Patient taking differently: Take 20 mg by mouth daily. ) 30 tablet 0  . gabapentin (NEURONTIN) 300 MG capsule Take 300 mg by mouth 3 (three) times daily.    Marland Kitchen glipiZIDE (GLUCOTROL) 10 MG tablet Take 10 mg by mouth 2 (two) times daily before a meal.     . HYDROcodone-acetaminophen (NORCO/VICODIN) 5-325 MG tablet Take 1 tablet by mouth every 6 (six) hours as needed for moderate pain. 15 tablet 0  . Insulin Glargine (LANTUS SOLOSTAR) 100 UNIT/ML Solostar Pen Inject 20-50 Units into the skin at bedtime as needed. Patient takes 20 units every night at bedtime if over 178.  Takes 50 units if her  blood sugar is above 300.    . Iron Polysacch Cmplx-B12-FA 150-0.025-1 MG CAPS TAKE 1 TABLET DAILY 30 each 5  . levETIRAcetam (KEPPRA) 750 MG tablet Take 750 mg by mouth 2 (two) times daily.    Marland Kitchen levothyroxine (SYNTHROID, LEVOTHROID) 137 MCG tablet Take 137 mcg by mouth daily.    Marland Kitchen loperamide (IMODIUM) 2 MG capsule Take 1 capsule (2 mg total) by mouth as needed for diarrhea or loose stools. 30 capsule 5  . LORazepam (ATIVAN) 1 MG tablet Take 1 mg by mouth 3 (three) times daily as needed for anxiety or sleep. For anxiety    . magnesium oxide (MAG-OX) 400 MG tablet Take 400 mg by mouth daily.     . metaxalone (SKELAXIN) 800 MG tablet Take 800 mg by mouth 3 (three) times daily.     . metFORMIN (GLUCOPHAGE) 500 MG tablet Take 1,000 mg by mouth 2 (two) times daily with a meal.     . Multiple Vitamin (MULTIVITAMIN WITH MINERALS) TABS tablet Take 1 tablet by mouth daily.    . nitroGLYCERIN (NITROSTAT) 0.4 MG SL tablet Place 0.4 mg under the tongue every 5 (five) minutes as needed for chest pain. Reported on 07/08/2015    . ondansetron (ZOFRAN) 4 MG tablet Take 1 tablet (4 mg total) by mouth 4 (four) times daily -  before meals and at bedtime. As needed for nausea. 120 tablet 11  . pantoprazole (PROTONIX) 40 MG tablet Take 1 tablet (40 mg total) by mouth 2 (two) times daily before a meal. 180 tablet 3  . potassium chloride (K-DUR) 10 MEQ tablet TAKE 2 TABLETS BY MOUTH TWICE DAILY FOR LOW POTASSIUM 120 tablet 3  . vitamin B-12 (CYANOCOBALAMIN) 500 MCG tablet Take 500 mcg by mouth daily.     No current facility-administered medications for this visit.      Physical Exam  Blood pressure 114/68, pulse 97, temperature 97.2 F (36.2 C), height 5\' 6"  (1.676 m), weight 238 lb (108 kg).  Constitutional: overall normal hygiene, normal nutrition, well developed, normal grooming, normal body habitus. Assistive device:cane  Musculoskeletal: gait and station Limp right, muscle tone and strength are normal, no  tremors or atrophy is present.  .  Neurological: coordination overall normal.  Deep tendon reflex/nerve stretch intact.  Sensation  normal.  Cranial nerves II-XII intact.   Skin:   Normal overall no scars, lesions, ulcers or rashes. No psoriasis.  Psychiatric: Alert and oriented x 3.  Recent memory intact, remote memory unclear.  Normal mood and affect. Well groomed.  Good eye contact.  Cardiovascular: overall no swelling, no varicosities, no edema bilaterally, normal temperatures of the legs and arms, no clubbing, cyanosis and good capillary refill.  Lymphatic: palpation is normal.  The right lower extremity is examined:  Inspection:  Thigh:  Non-tender and no defects  Knee has swelling 2+ effusion.                        Joint tenderness is present                        Patient is tender over the medial joint line  Lower Leg:  Has normal appearance and no tenderness or defects  Ankle:  Non-tender and no defects  Foot:  Non-tender and no defects Range of Motion:  Knee:  Range of motion is: 0-100                        Crepitus is  present  Ankle:  Range of motion is normal. Strength and Tone:  The right lower extremity has normal strength and tone. Stability:  Knee:  The knee has positive medial McMurray.  Ankle:  The ankle is stable.    The patient has been educated about the nature of the problem(s) and counseled on treatment options.  The patient appeared to understand what I have discussed and is in agreement with it.  Encounter Diagnoses  Name Primary?  . Right knee pain Yes  . Morbid obesity due to excess calories [facility])     PLAN Call if any problems.  Precautions discussed.  Continue current medications.   Return to clinic See cardiologist and then Dr. Romeo Apple   Electronically Signed Darreld Mclean, MD 9/19/201710:25 AM

## 2016-03-25 DIAGNOSIS — Z23 Encounter for immunization: Secondary | ICD-10-CM | POA: Diagnosis not present

## 2016-03-30 ENCOUNTER — Encounter: Payer: Self-pay | Admitting: *Deleted

## 2016-03-31 ENCOUNTER — Encounter (HOSPITAL_COMMUNITY): Payer: Medicare Other | Attending: Hematology & Oncology

## 2016-03-31 DIAGNOSIS — D509 Iron deficiency anemia, unspecified: Secondary | ICD-10-CM | POA: Diagnosis not present

## 2016-03-31 LAB — FERRITIN: Ferritin: 112 ng/mL (ref 11–307)

## 2016-03-31 LAB — CBC
HEMATOCRIT: 36.6 % (ref 36.0–46.0)
Hemoglobin: 12 g/dL (ref 12.0–15.0)
MCH: 32.3 pg (ref 26.0–34.0)
MCHC: 32.8 g/dL (ref 30.0–36.0)
MCV: 98.7 fL (ref 78.0–100.0)
PLATELETS: 197 10*3/uL (ref 150–400)
RBC: 3.71 MIL/uL — ABNORMAL LOW (ref 3.87–5.11)
RDW: 14.4 % (ref 11.5–15.5)
WBC: 9.4 10*3/uL (ref 4.0–10.5)

## 2016-04-11 ENCOUNTER — Ambulatory Visit: Payer: Medicare Other | Admitting: Adult Health

## 2016-04-15 ENCOUNTER — Ambulatory Visit (INDEPENDENT_AMBULATORY_CARE_PROVIDER_SITE_OTHER): Payer: Medicare Other | Admitting: Adult Health

## 2016-04-15 ENCOUNTER — Encounter: Payer: Self-pay | Admitting: Adult Health

## 2016-04-15 VITALS — BP 128/74 | HR 61 | Ht 66.0 in | Wt 238.0 lb

## 2016-04-15 DIAGNOSIS — Z01810 Encounter for preprocedural cardiovascular examination: Secondary | ICD-10-CM | POA: Diagnosis not present

## 2016-04-15 DIAGNOSIS — I5032 Chronic diastolic (congestive) heart failure: Secondary | ICD-10-CM | POA: Diagnosis not present

## 2016-04-15 DIAGNOSIS — I4891 Unspecified atrial fibrillation: Secondary | ICD-10-CM | POA: Diagnosis not present

## 2016-04-15 NOTE — Progress Notes (Signed)
Cardiology Office Note   Date:  04/15/2016   ID:  Tara CookeyMona H [name], DOB 06-25-1951, MRN [id number]  PCP:  Milana ObeyStephen D Knowlton, MD  Cardiologist: Arlington CalixBranch/  Surabhi Gadea, NP   Chief Complaint  Patient presents with  . Pre-op Exam  . Atrial Fibrillation      History of Present Illness: [name] is a 65 y.o. female who presents for ongoing assessment and management of chronic mixed CHF, pulmonary hypertension, atrial fibrillation, with history of chronic ascites and anemia. She was seen by Dr. Wyline Moodbranch on 02/09/2016 after reviewing echocardiogram completed on 01/21/2015.  On that office visit the patient had progressive weight gain and edema. Her Lasix was increased to 20 mg twice a day for 3 days and then resume 20 mg daily she was continued on ELIQUIS, CHADS  VASC score of 2. Follow-up labs on 03/31/2016 revealed a hemoglobin of 12.0, hematocrit 36.6, white blood cells 9.4, platelets 197.  She comes today without any complaints. Her weight has been unchanged that she has decreased edema. She is due to have right knee arthroscopy with Dr. Romeo AppleHarrison. She is here for preoperative evaluation. She states she drinks 12 cups of water a day. She states that the fluid medicine is making her thirsty. She is taking her Lasix every other day now.  Past Medical History:  Diagnosis Date  . Anemia    H/H of 10/30 with a normal MCV in 12/09  . Barrett's esophagus    Diagnosed 1995. Last EGD 2016-NO BARRETT'S.   . Chest pain    Negative cardiac catheterization in 2002; negative stress nuclear study in 2008  . Chronic anticoagulation   . Chronic diastolic CHF (congestive heart failure) (HCC)    a. EF predominantly normal during prior echoes but was 40% during 10/2014 echo. b. Most recent 01/2015 EF normal, 55-60%.  . Chronic LBP    Surgical intervention in 1996  . Diabetes mellitus, type 2 (HCC)    Insulin therapy; exacerbated by prednisone  . Gastroparesis    99% retention 05/2008 on GES  . GERD  (gastroesophageal reflux disease)   . Hiatal hernia   . Hyperlipidemia   . Hypertension   . Hypothyroid   . IBS (irritable bowel syndrome)   . Obesity   . OSA on CPAP   . Paroxysmal atrial fibrillation (HCC)   . Pulmonary hypertension 01/2015   a. Predominantly pulmonary venous hypertension but may be component of PAH.  . Seizures (HCC)   . Syncope    a. Admitted 05/2009; magnetic resonance imagin/ MRA - negative; etiology thought to be orthostasis secondary to drugs and dehydration. b. Syncope 02/2015 also felt 2/2 dehydration.    Past Surgical History:  Procedure Laterality Date  . BACK SURGERY  1996  . BIOPSY N/A 11/08/2013   Procedure: BIOPSY  / Tissue sampling / ulcers present in small intestine;  Surgeon: West BaliSandi L Fields, MD;  Location: [facility];  Service: Endoscopy;  Laterality: N/A;  . CARDIAC CATHETERIZATION  2002  . CARDIAC CATHETERIZATION N/A 01/26/2015   Procedure: Right Heart Cath;  Surgeon: Laurey Moralealton S McLean, MD;  [facility];  Service: Cardiovascular;  Laterality: N/A;  . CARDIOVASCULAR STRESS TEST  2008   Stress nuclear study  . CARDIOVERSION N/A 03/06/2015   Procedure: CARDIOVERSION;  Surgeon: Laurey Moralealton S McLean, MD;  [facility];  Service: Cardiovascular;  Laterality: N/A;  . CARPAL TUNNEL RELEASE  1994  . COLONOSCOPY  11/2011   Dr. Darrick Pennafields: Internal hemorrhoids,  mild diverticulosis. Random colon biopsies negative.  . COLONOSCOPY N/A 11/08/2013   SLF: Normal mucosa in the terminal ileum/The colon IS redundant/  Moderate diverticulosis throughout the entire colon. ileum bx benign. colon bx benign  . ESOPHAGOGASTRODUODENOSCOPY  2008   Barrett's without dysplasia. esphagus dilated. antral erosions, h.pylori serologies negative.  . ESOPHAGOGASTRODUODENOSCOPY  11/2011   Dr. Darrick Penna: Barrett's esophagus, mild gastritis, diverticulum in the second portion of the duodenum repeat EGD 3 years. Small bowel biopsies negative. Gastric biopsy show reactive  gastropathy but no H. pylori. Esophageal biopsies consistent with GERD. Next EGD 11/2014  . ESOPHAGOGASTRODUODENOSCOPY N/A 11/21/2014   ZOX:WRUE non-erosive gastritis/irregular z-line  . GIVENS CAPSULE STUDY  12/07/2011   Proximal small bowel, rare AVM. Distal small bowel, multiple ulcers noted  . GIVENS CAPSULE STUDY N/A 09/27/2013   Distal small bowel ulcers extending to TI.  Marland Kitchen GIVENS CAPSULE STUDY N/A 10/10/2013   Procedure: GIVENS CAPSULE STUDY;  Surgeon: West Bali, MD;  Location: [facility];  Service: Endoscopy;  Laterality: N/A;  7:30  . LAMINECTOMY  1995   L4-L5  . LAPAROSCOPIC CHOLECYSTECTOMY  1990s  . LEFT HEART CATHETERIZATION WITH CORONARY ANGIOGRAM  01/10/2014   Procedure: LEFT HEART CATHETERIZATION WITH CORONARY ANGIOGRAM;  Surgeon: Lesleigh Noe, MD;  Location: [facility] CATH LAB;  Service: Cardiovascular;;  . RIGHT HEART CATHETERIZATION N/A 01/10/2014   Procedure: RIGHT HEART CATH;  Surgeon: Lesleigh Noe, MD;  Location: [facility] CATH LAB;  Service: Cardiovascular;  Laterality: N/A;  . TOTAL ABDOMINAL HYSTERECTOMY  1999     Current Outpatient Prescriptions  Medication Sig Dispense Refill  . acetaminophen (TYLENOL) 500 MG tablet Take 500 mg by mouth every 6 (six) hours as needed for moderate pain or headache.    Marland Kitchen amiodarone (PACERONE) 200 MG tablet TAKE 1 TABLET BY MOUTH EVERY DAY 30 tablet 1  . apixaban (ELIQUIS) 5 MG TABS tablet Take 5 mg by mouth 2 (two) times daily.    Marland Kitchen dicyclomine (BENTYL) 10 MG capsule 1-2 capsules before meals as needed for abdominal cramping and loose stool 180 capsule 3  . dronabinol (MARINOL) 2.5 MG capsule Take 2.5 mg by mouth 2 (two) times daily before a meal.    . DULoxetine (CYMBALTA) 30 MG capsule Take 30 mg by mouth 2 (two) times daily.    . famotidine (PEPCID) 20 MG tablet Take 20 mg by mouth at bedtime.     . furosemide (LASIX) 40 MG tablet Take 1 tablet (40 mg total) by mouth daily. (Patient taking differently: Take 20 mg by mouth daily. ) 30  tablet 0  . gabapentin (NEURONTIN) 300 MG capsule Take 300 mg by mouth 3 (three) times daily.    Marland Kitchen glipiZIDE (GLUCOTROL) 10 MG tablet Take 10 mg by mouth 2 (two) times daily before a meal.     . HYDROcodone-acetaminophen (NORCO/VICODIN) 5-325 MG tablet Take 1 tablet by mouth every 6 (six) hours as needed for moderate pain. 15 tablet 0  . Insulin Glargine (LANTUS SOLOSTAR) 100 UNIT/ML Solostar Pen Inject 20-50 Units into the skin at bedtime as needed. Patient takes 20 units every night at bedtime if over 178.  Takes 50 units if her blood sugar is above 300.    . Iron Polysacch Cmplx-B12-FA 150-0.025-1 MG CAPS TAKE 1 TABLET DAILY 30 each 5  . levETIRAcetam (KEPPRA) 750 MG tablet Take 750 mg by mouth 2 (two) times daily.    Marland Kitchen levothyroxine (SYNTHROID, LEVOTHROID) 137 MCG tablet Take 137 mcg by mouth  daily.    . loperamide (IMODIUM) 2 MG capsule Take 1 capsule (2 mg total) by mouth as needed for diarrhea or loose stools. 30 capsule 5  . LORazepam (ATIVAN) 1 MG tablet Take 1 mg by mouth 3 (three) times daily as needed for anxiety or sleep. For anxiety    . magnesium oxide (MAG-OX) 400 MG tablet Take 400 mg by mouth daily.     . metaxalone (SKELAXIN) 800 MG tablet Take 800 mg by mouth 3 (three) times daily.     . metFORMIN (GLUCOPHAGE) 500 MG tablet Take 1,000 mg by mouth 2 (two) times daily with a meal.     . Multiple Vitamin (MULTIVITAMIN WITH MINERALS) TABS tablet Take 1 tablet by mouth daily.    . nitroGLYCERIN (NITROSTAT) 0.4 MG SL tablet Place 0.4 mg under the tongue every 5 (five) minutes as needed for chest pain. Reported on 07/08/2015    . ondansetron (ZOFRAN) 4 MG tablet Take 1 tablet (4 mg total) by mouth 4 (four) times daily -  before meals and at bedtime. As needed for nausea. 120 tablet 11  . pantoprazole (PROTONIX) 40 MG tablet Take 1 tablet (40 mg total) by mouth 2 (two) times daily before a meal. 180 tablet 3  . potassium chloride (K-DUR) 10 MEQ tablet TAKE 2 TABLETS BY MOUTH TWICE DAILY  FOR LOW POTASSIUM 120 tablet 3  . vitamin B-12 (CYANOCOBALAMIN) 500 MCG tablet Take 500 mcg by mouth daily.     No current facility-administered medications for this visit.     Allergies:   Ibuprofen; Codeine; Dilaudid [hydromorphone hcl]; Celexa [citalopram hydrobromide]; and Reglan [metoclopramide]    Social History:  The patient  reports that she quit smoking about 32 years ago. Her smoking use included Cigarettes. She started smoking about 50 years ago. She has a 3.75 pack-year smoking history. She has never used smokeless tobacco. She reports that she does not drink alcohol or use drugs.   Family History:  The patient's family history includes Alzheimer's disease in her mother; Breast cancer in her sister; Heart attack in her mother; Hypertension in her mother and other; Stroke in her mother.    ROS: All other systems are reviewed and negative. Unless otherwise mentioned in H&P    PHYSICAL EXAM: VS:  BP 128/74   Pulse 61   Ht 5\' 6"  (1.676 m)   Wt 238 lb (108 kg)   SpO2 93%   BMI 38.41 kg/m  , BMI Body mass index is 38.41 kg/m. GEN: Well nourished, well developed, in no acute distress Obese HEENT: normal  Neck: no JVD, carotid bruits, or masses Cardiac: RRR; no murmurs, rubs, or gallops,no edema  Respiratory: Clear to auscultation bilaterally, normal work of breathing GI: soft, nontender, nondistended, + BS MS: no deformity or atrophy  Skin: warm and dry, no rash Neuro:  Strength and sensation are intact Psych: euthymic mood, full affect   EKG:  Atrial fib with slow ventricular response.  Rate of 57 bpm   Recent Labs: 04/21/2015: Pro B Natriuretic peptide (BNP) 470.0 12/26/2015: ALT 18; TSH 2.553 12/27/2015: BUN 22; Creatinine, Ser 1.00; Potassium 3.2; Sodium 135 03/31/2016: Hemoglobin 12.0; Platelets 197    Lipid Panel    Component Value Date/Time   CHOL 77 09/02/2011 0446   TRIG 88 09/02/2011 0446   HDL 35 (L) 09/02/2011 0446   CHOLHDL 2.2 09/02/2011 0446    VLDL 18 09/02/2011 0446   LDLCALC 24 09/02/2011 0446      Wt Readings  from Last 3 Encounters:  04/15/16 238 lb (108 kg)  03/22/16 238 lb (108 kg)  03/02/16 237 lb (107.5 kg)      Other studies Reviewed: Additional studies/ records that were reviewed today include:  Echocardiogram 12/06/2015 Left ventricle: The cavity size was normal. Systolic function was   normal. Wall motion was normal; there were no regional wall   motion abnormalities. Doppler parameters are consistent with   abnormal left ventricular relaxation (grade 1 diastolic   dysfunction). Doppler parameters are consistent with elevated   ventricular end-diastolic filling pressure. - Aortic root: The aortic root was normal in size. - Mitral valve: Calcified annulus. Mildly thickened leaflets .   There was mild regurgitation. - Left atrium: The atrium was moderately dilated. - Right ventricle: The cavity size was normal. Wall thickness was   normal. Systolic function was normal. - Right atrium: The atrium was normal in size. - Tricuspid valve: There was no regurgitation. - Pulmonary arteries: Systolic pressure was mildly increased. PA   peak pressure: 44 mm Hg (S). - Inferior vena cava: The vessel was normal in size. - Pericardium, extracardiac: There was no pericardial effusion.   ASSESSMENT AND PLAN:  1.  Preoperative evaluation: The patient is a low cardiac risk to undergo right knee arthroscopy. We have asked her to hold ELIQUIS 48 hours prior to the procedure. She is to restart ELIQUIS as soon as possible post procedure. A letter has been sent to Dr. Mort Sawyers office to confirm this. We will see her in 3 months.  2. Paroxysmal atrial fibrillation: Heart rate is currently well controlled. She continues on amiodarone 200 mg daily, along with anticoagulation with ELIQUIS. CHADS VASC Score of 4. As stated above she should hold the ELIQUIS 48 hours prior to procedure. Recent labs and completed by Dr. Galen Manila and she  is not found to be anemic.  3. Chronic diastolic heart failure: She does not appear to be decompensated at this time. She continues to weigh herself daily. She is getting good response from Lasix every other day. She is advised if she gains 2-3 pounds in 24 hours she is to take Lasix daily for 2 days. She is advised to cut back on her fluid intake. She verbalizes understanding.   Current medicines are reviewed at length with the patient today.    Labs/ tests ordered today include:   Orders Placed This Encounter  Procedures  . EKG 12-Lead     Disposition:   FU with 3 months  Signed, Joni Reining, NP  04/15/2016 3:38 PM    [facility] [address], [place], [place] [address] Phone: [phone]; Fax: [phone]

## 2016-04-15 NOTE — Patient Instructions (Signed)
Your physician recommends that you schedule a follow-up appointment in: 3 months    HOLD ELIQUIS 48 HOURS BEFORE ARTHROSCOPY,MAY RESUME DAY AFTER PROCEDURE    Your physician recommends that you continue on your current medications as directed. Please refer to the Current Medication list given to you today.     Thank you for choosing [facility] !

## 2016-04-15 NOTE — Progress Notes (Signed)
Name: [name]    DOB: 1951/03/07  Age: 65 y.o.  MR#: [id number]       PCP:  [name], MD      Insurance: Payor: MEDICARE / Plan: MEDICARE PART A AND B / Product Type: *No Product type* /   CC:   No chief complaint on file.   VS Vitals:   04/15/16 1459  Weight: 238 lb (108 kg)  Height: 5\' 6"  (1.676 m)    Weights Current Weight  04/15/16 238 lb (108 kg)  03/22/16 238 lb (108 kg)  03/02/16 237 lb (107.5 kg)    Blood Pressure  BP Readings from Last 3 Encounters:  03/22/16 114/68  03/02/16 113/68  02/09/16 124/64     Admit date:  (Not on file) Last encounter with RMR:  Visit date not found   Allergy Ibuprofen; Codeine; Dilaudid [hydromorphone hcl]; Celexa [citalopram hydrobromide]; and Reglan [metoclopramide]  Current Outpatient Prescriptions  Medication Sig Dispense Refill  . acetaminophen (TYLENOL) 500 MG tablet Take 500 mg by mouth every 6 (six) hours as needed for moderate pain or headache.    Marland Kitchen amiodarone (PACERONE) 200 MG tablet TAKE 1 TABLET BY MOUTH EVERY DAY 30 tablet 1  . apixaban (ELIQUIS) 5 MG TABS tablet Take 5 mg by mouth 2 (two) times daily.    Marland Kitchen dicyclomine (BENTYL) 10 MG capsule 1-2 capsules before meals as needed for abdominal cramping and loose stool 180 capsule 3  . dronabinol (MARINOL) 2.5 MG capsule Take 2.5 mg by mouth 2 (two) times daily before a meal.    . DULoxetine (CYMBALTA) 30 MG capsule Take 30 mg by mouth 2 (two) times daily.    . famotidine (PEPCID) 20 MG tablet Take 20 mg by mouth at bedtime.     . furosemide (LASIX) 40 MG tablet Take 1 tablet (40 mg total) by mouth daily. (Patient taking differently: Take 20 mg by mouth daily. ) 30 tablet 0  . gabapentin (NEURONTIN) 300 MG capsule Take 300 mg by mouth 3 (three) times daily.    Marland Kitchen glipiZIDE (GLUCOTROL) 10 MG tablet Take 10 mg by mouth 2 (two) times daily before a meal.     . HYDROcodone-acetaminophen (NORCO/VICODIN) 5-325 MG tablet Take 1 tablet by mouth every 6 (six) hours as  needed for moderate pain. 15 tablet 0  . Insulin Glargine (LANTUS SOLOSTAR) 100 UNIT/ML Solostar Pen Inject 20-50 Units into the skin at bedtime as needed. Patient takes 20 units every night at bedtime if over 178.  Takes 50 units if her blood sugar is above 300.    . Iron Polysacch Cmplx-B12-FA 150-0.025-1 MG CAPS TAKE 1 TABLET DAILY 30 each 5  . levETIRAcetam (KEPPRA) 750 MG tablet Take 750 mg by mouth 2 (two) times daily.    Marland Kitchen levothyroxine (SYNTHROID, LEVOTHROID) 137 MCG tablet Take 137 mcg by mouth daily.    Marland Kitchen loperamide (IMODIUM) 2 MG capsule Take 1 capsule (2 mg total) by mouth as needed for diarrhea or loose stools. 30 capsule 5  . LORazepam (ATIVAN) 1 MG tablet Take 1 mg by mouth 3 (three) times daily as needed for anxiety or sleep. For anxiety    . magnesium oxide (MAG-OX) 400 MG tablet Take 400 mg by mouth daily.     . metaxalone (SKELAXIN) 800 MG tablet Take 800 mg by mouth 3 (three) times daily.     . metFORMIN (GLUCOPHAGE) 500 MG tablet Take 1,000 mg by mouth 2 (two) times daily with a meal.     .  Multiple Vitamin (MULTIVITAMIN WITH MINERALS) TABS tablet Take 1 tablet by mouth daily.    . nitroGLYCERIN (NITROSTAT) 0.4 MG SL tablet Place 0.4 mg under the tongue every 5 (five) minutes as needed for chest pain. Reported on 07/08/2015    . ondansetron (ZOFRAN) 4 MG tablet Take 1 tablet (4 mg total) by mouth 4 (four) times daily -  before meals and at bedtime. As needed for nausea. 120 tablet 11  . pantoprazole (PROTONIX) 40 MG tablet Take 1 tablet (40 mg total) by mouth 2 (two) times daily before a meal. 180 tablet 3  . potassium chloride (K-DUR) 10 MEQ tablet TAKE 2 TABLETS BY MOUTH TWICE DAILY FOR LOW POTASSIUM 120 tablet 3  . vitamin B-12 (CYANOCOBALAMIN) 500 MCG tablet Take 500 mcg by mouth daily.     No current facility-administered medications for this visit.     Discontinued Meds:   There are no discontinued medications.  Patient Active Problem List   Diagnosis Date Noted  .  Syncope, near 12/26/2015  . Fall at home 12/26/2015  . Dysphagia 12/07/2015  . IDA (iron deficiency anemia) 03/13/2015  . Chronic diastolic CHF (congestive heart failure) (HCC) 02/20/2015  . UTI (urinary tract infection) 02/07/2015  . Syncope and collapse 02/05/2015  . Dehydration   . Acute on chronic combined systolic and diastolic CHF (congestive heart failure) (HCC) 01/26/2015  . Low hemoglobin   . Diffuse abdominal pain 09/16/2014  . Ascites 08/27/2014  . Anemia 07/20/2014  . Epistaxis, recurrent 07/20/2014  . Syncope 04/11/2014  . Dementia 04/11/2014  . Chronic diarrhea 04/11/2014  . DM type 2 (diabetes mellitus, type 2) (HCC) 04/11/2014  . Protein-calorie malnutrition, severe (HCC) 04/11/2014  . Acute CHF (HCC) 12/21/2013  . Atrial fibrillation with RVR (HCC) 12/21/2013  . Chest pain at rest 12/03/2013  . Obesity 12/03/2013  . Chest pain 12/03/2013  . Diarrhea 08/26/2013  . Abnormal weight loss 08/26/2013  . Lower abdominal pain 08/26/2013  . Anorexia nervosa 08/26/2013  . Encounter for therapeutic drug monitoring 08/26/2013  . Dyspnea 06/29/2013  . Tremor 10/27/2012  . Chronic anticoagulation 06/23/2011  . HYPOTENSION, ORTHOSTATIC 08/26/2009  . Hyperlipidemia 03/04/2009  . Barrett's esophagus 09/26/2008  . Gastroparesis 09/26/2008  . Iron deficiency anemia 09/25/2008  . Essential hypertension 09/25/2008  . LUNG NODULE 09/25/2008  . Irritable bowel syndrome 09/25/2008  . SLEEP APNEA 09/25/2008  . CARPAL TUNNEL SYNDROME, HX OF 09/25/2008  . Hypothyroidism 04/10/2008  . DIABETES MELLITUS, TYPE II, ON INSULIN 04/10/2008  . OBESITY 04/10/2008  . Atrial fibrillation (HCC) 04/10/2008  . Congestive heart failure (HCC) 04/10/2008  . LOW BACK PAIN, CHRONIC 04/10/2008  . CHEST PAIN-PRECORDIAL 04/10/2008    LABS    Component Value Date/Time   NA 135 12/27/2015 0904   NA 138 12/26/2015 0455   NA 137 12/26/2015 0111   K 3.2 (L) 12/27/2015 0904   K 3.5 12/26/2015  0455   K 3.7 12/26/2015 0111   CL 105 12/27/2015 0904   CL 108 12/26/2015 0455   CL 104 12/26/2015 0111   CO2 23 12/27/2015 0904   CO2 23 12/26/2015 0455   CO2 25 12/26/2015 0111   GLUCOSE 234 (H) 12/27/2015 0904   GLUCOSE 173 (H) 12/26/2015 0455   GLUCOSE 178 (H) 12/26/2015 0111   BUN 22 (H) 12/27/2015 0904   BUN 24 (H) 12/26/2015 0455   BUN 25 (H) 12/26/2015 0111   CREATININE 1.00 12/27/2015 0904   CREATININE 1.44 (H) 12/26/2015 0455   CREATININE 1.61 (  H) 12/26/2015 0111   CREATININE 0.70 02/13/2015 1414   CREATININE 0.75 11/29/2014 0803   CREATININE 0.68 10/04/2014 1142   CALCIUM 8.2 (L) 12/27/2015 0904   CALCIUM 8.3 (L) 12/26/2015 0455   CALCIUM 8.4 (L) 12/26/2015 0111   CALCIUM 8.4 06/30/2013 0318   GFRNONAA 57 (L) 12/27/2015 0904   GFRNONAA 37 (L) 12/26/2015 0455   GFRNONAA 32 (L) 12/26/2015 0111   GFRNONAA 78 08/26/2013 0921   GFRAA >60 12/27/2015 0904   GFRAA 43 (L) 12/26/2015 0455   GFRAA 37 (L) 12/26/2015 0111   GFRAA >89 08/26/2013 0921   CMP     Component Value Date/Time   NA 135 12/27/2015 0904   K 3.2 (L) 12/27/2015 0904   CL 105 12/27/2015 0904   CO2 23 12/27/2015 0904   GLUCOSE 234 (H) 12/27/2015 0904   BUN 22 (H) 12/27/2015 0904   CREATININE 1.00 12/27/2015 0904   CREATININE 0.70 02/13/2015 1414   CALCIUM 8.2 (L) 12/27/2015 0904   CALCIUM 8.4 06/30/2013 0318   PROT 6.7 12/26/2015 0111   ALBUMIN 3.6 12/26/2015 0111   AST 23 12/26/2015 0111   ALT 18 12/26/2015 0111   ALKPHOS 73 12/26/2015 0111   BILITOT 0.7 12/26/2015 0111   GFRNONAA 57 (L) 12/27/2015 0904   GFRNONAA 78 08/26/2013 0921   GFRAA >60 12/27/2015 0904   GFRAA >89 08/26/2013 0921       Component Value Date/Time   WBC 9.4 03/31/2016 1059   WBC 9.1 12/31/2015 1359   WBC 9.8 12/26/2015 0455   HGB 12.0 03/31/2016 1059   HGB 12.4 12/31/2015 1359   HGB 12.7 12/26/2015 0455   HCT 36.6 03/31/2016 1059   HCT 39.2 12/31/2015 1359   HCT 38.3 12/26/2015 0455   MCV 98.7 03/31/2016 1059    MCV 98.2 12/31/2015 1359   MCV 96.0 12/26/2015 0455    Lipid Panel     Component Value Date/Time   CHOL 77 09/02/2011 0446   TRIG 88 09/02/2011 0446   HDL 35 (L) 09/02/2011 0446   CHOLHDL 2.2 09/02/2011 0446   VLDL 18 09/02/2011 0446   LDLCALC 24 09/02/2011 0446    ABG    Component Value Date/Time   PHART 7.412 01/10/2014 1411   PCO2ART 34.8 (L) 01/10/2014 1411   PO2ART 135.0 (H) 01/10/2014 1411   HCO3 25.6 (H) 01/26/2015 1132   TCO2 27 01/26/2015 1132   ACIDBASEDEF 1.0 01/26/2015 1132   O2SAT 59.0 01/26/2015 1132     Lab Results  Component Value Date   TSH 2.553 12/26/2015   BNP (last 3 results) No results for input(s): BNP in the last 8760 hours.  ProBNP (last 3 results)  Recent Labs  04/21/15 1548  PROBNP 470.0*    Cardiac Panel (last 3 results) No results for input(s): CKTOTAL, CKMB, TROPONINI, RELINDX in the last 72 hours.  Iron/TIBC/Ferritin/ %Sat    Component Value Date/Time   IRON 42 12/31/2015 1359   TIBC 319 12/31/2015 1359   FERRITIN 112 03/31/2016 1059   IRONPCTSAT 13 12/31/2015 1359   IRONPCTSAT 8 (L) 10/04/2014 1140     EKG Orders placed or performed during the hospital encounter of 12/26/15  . ED EKG  . ED EKG  . EKG     Prior Assessment and Plan Problem List as of 04/15/2016 Reviewed: 03/22/2016 10:22 AM by Darreld Mclean, MD     Cardiovascular and Mediastinum   Essential hypertension   Last Assessment & Plan 08/27/2014 Office Visit Written 08/27/2014 10:52 AM by  Dyann Kief, PA-C    Blood pressure is on the low side but no dizziness or presyncope.      Atrial fibrillation [facility])   Last Assessment & Plan 08/27/2014 Office Visit Written 08/27/2014 10:52 AM by Dyann Kief, PA-C    Patient remains in atrial fibrillation with controlled rate on low-dose metoprolol. She is also on Eliquis 5 mg twice a day. Continue both. Recent nosebleed in January without recurrence.      Congestive heart failure [facility])   Last Assessment & Plan  12/25/2013 Office Visit Written 12/25/2013  3:22 PM by Jodelle Gross, NP    No evidence of fluid overload currently. Blood pressures on the low side. I decreased her Lasix from 40 mg daily to 20 mg lately and she is having some symptoms of dizziness. She is avoiding salty foods and not adding salt to her foods at all. She is continuing to lose weight and is no evidence of edema in the lower extremities. She will have precatheterization labs to evaluate kidney function. On discharge her kidney function was normal at 0.78 on 12/22/2013.      HYPOTENSION, ORTHOSTATIC   Last Assessment & Plan 10/26/2012 Office Visit Written 10/27/2012 12:31 PM by Kathlen Brunswick, MD    No orthostatic change in blood pressure at today's visit.      Acute CHF [facility])   Last Assessment & Plan 08/27/2014 Office Visit Written 08/27/2014 10:51 AM by Dyann Kief, PA-C    Patient has 30 pound weight gain with ascites and lower extremity edema. Will attempt outpatient diuresis, but advised patient she may need IV diuresis and admission to the hospital. Patient does not improve in the next day or so she is advised to come to the emergency room.      Atrial fibrillation with RVR (HCC)   Syncope   Acute on chronic combined systolic and diastolic CHF (congestive heart failure) (HCC)   Syncope and collapse   Chronic diastolic CHF (congestive heart failure) (HCC)   Syncope, near     Respiratory   LUNG NODULE   SLEEP APNEA   Epistaxis, recurrent     Digestive   Chronic diarrhea   Last Assessment & Plan 02/05/2016 Office Visit Written 02/10/2016 10:32 PM by Nira Retort, NP    Continue Bentyl as needed, Imodium prn. 6 month return.       Barrett's esophagus   Last Assessment & Plan 09/16/2014 Office Visit Written 09/21/2014 12:09 AM by Tiffany Kocher, PA-C    Due repeat EGD this year. Patient not ready to schedule at this time. Continue PPI.       Gastroparesis   Last Assessment & Plan 02/05/2016 Office Visit Written  02/10/2016 10:32 PM by Nira Retort, NP    Doing well with scheduled Zofran, Protonix BID. Return in 6 months.       Irritable bowel syndrome   Last Assessment & Plan 12/07/2015 Office Visit Written 12/09/2015 11:32 AM by Nira Retort, NP    Increase Bentyl to 1-2 capsules as needed.       Dysphagia   Last Assessment & Plan 12/07/2015 Office Visit Written 12/09/2015 11:41 AM by Nira Retort, NP    Last EGD in May 2016. Proceed with BPE.         Endocrine   DM type 2 (diabetes mellitus, type 2) (HCC)   Hypothyroidism   DIABETES MELLITUS, TYPE II, ON INSULIN   Last Assessment & Plan 09/01/2011  Clinical Support Written 09/01/2011 10:06 PM by Kathlen Brunswick, MD    Patient reports improvement in diabetic control with recent 40 pound weight loss.  She was congratulated on this achievement.        Nervous and Auditory   Dementia   CARPAL TUNNEL SYNDROME, HX OF     Genitourinary   UTI (urinary tract infection)     Other   Hyperlipidemia   Last Assessment & Plan 12/26/2012 Office Visit Written 12/26/2012  3:30 PM by Kathlen Brunswick, MD    Extremely low total and LDL cholesterol. Current lipid-lowering therapy is effective and will be continued.      OBESITY   Last Assessment & Plan 09/01/2011 Clinical Support Written 09/01/2011 10:08 PM by Kathlen Brunswick, MD    Patient reports a 40 pound weight loss.  No weight contained in conjunction with today's visit.      Iron deficiency anemia   Last Assessment & Plan 12/31/2015 Office Visit Edited 12/31/2015  3:19 PM by Ellouise Newer, PA-C    Iron deficiency anemia having undergone an extensive GI work-up that was negative including colonoscopy (11/08/2013), EGD (11/21/2014), and camera capsule study.   Oncology Flowsheet 07/10/2015 07/17/2015  ferric gluconate (NULECIT) IV 125 mg 125 mg   Labs today: CBC diff, iron/TIBC, ferritin.  I personally reviewed and went over laboratory results with the patient.  The results are noted within this  dictation.  I personally reviewed and went over radiographic studies with the patient.  The results are noted within this dictation.  Mammogram was negative in Jan 2017.  Chart is reviewed.  Recent hospitalization for acute renal insufficiency with hypotension and near syncopal episode is noted.    Labs in 3 months: CBC, ferritin  Labs in 6 months: CBC diff, iron/TIBC, ferritin  She is due for a refill on her PO iron.  I will switch her to ferrex forte.  Return in 6 months for follow-up      LOW BACK PAIN, CHRONIC   CHEST PAIN-PRECORDIAL   Last Assessment & Plan 09/01/2011 Clinical Support Written 09/03/2011  3:53 PM by Kathlen Brunswick, MD    No recent chest discomfort.      Chronic anticoagulation   Last Assessment & Plan 10/26/2012 Office Visit Written 10/27/2012 12:30 PM by Kathlen Brunswick, MD    Mild anemia persists, likely do to chronic disease although previously characterized as iron deficiency. FOBT will be repeated.      Tremor   Last Assessment & Plan 12/26/2012 Office Visit Written 12/26/2012  3:31 PM by Kathlen Brunswick, MD    Tardive dyskinesia is more prominent than I have noted in the past. Metoclopramide may be contributing. Patient is scheduled for reassessment by Dr. Gerilyn Pilgrim.      Dyspnea   Last Assessment & Plan 01/22/2016 Office Visit Written 01/24/2016  6:16 AM by Nyoka Cowden, MD    Placed on amiodarone 01/29/15  04/10/15  FEV1  1.49 (60%) ratio 88 and dlco 53 corrects to 89% at amiodarone 400 04/21/2015  Walked RA x 2 laps @ 185 ft each stopped due to  Dizzy and legs gave out, nl pace, no desat  - PFT's  07/22/2015  FEV1 1.76 (71 % ) ratio 86  p 2 % improvement from saba with  VC 2.02(62%) DLCO  56 % corrects to 93 % for alv volume  - PFT's  01/22/2016  FEV1 1.63 (66 % ) ratio 90  p 16 %  improvement from saba p nothing prior to study with VC 1.84(57%)  DLCO  54/53 % corrects to 89 % for alv volume     There has been no significant change in ex tol or pfts vs  previous with the caveat that her ex is now more limited by back that breathing and as a result she's really no pushing herself as much in terms of ventilatory demand.  So there has been no convincing evidence of amiodarone toxicity. Patients typically have been on amiodarone for 6-12 months before this complication manifests.  Of note, serial clinical evaluation for symptoms such as cough dyspnea or fevers is  the preferred method of monitoring for pulmonary toxicity because a decrease in DLCO or lung volumes is a nonspecific for toxicity. Pathologically amiodarone pulmonary toxicity may appear as interstitial pneumonitis, eosinophilic pneumonia, organizing pneumonia, pulmonary fibrosis or less commonly as diffuse alveolar hemorrhage, pulmonary nodules or pleural effusions.  Risk factors for pulmonary toxicity include age greater than 60, daily dose greater than equal to 400 mg, a high cumulative dose, or pre-existing lung disease    She has 2/4 risk factors  Discussed in detail all the  indications, usual  risks and alternatives  relative to the benefits with patient who agrees to proceed with conservative f/u as outlined with f/u here q 6 m unless new symptoms or amiodarone is d/cd  I had an extended discussion with the patient reviewing all relevant studies completed to date and  lasting 15 to 20 minutes of a 25 minute visit    Each maintenance medication was reviewed in detail including most importantly the difference between maintenance and prns and under what circumstances the prns are to be triggered using an action plan format that is not reflected in the computer generated alphabetically organized AVS.    Please see instructions for details which were reviewed in writing and the patient given a copy highlighting the part that I personally wrote and discussed at today's ov.         Diarrhea   Abnormal weight loss   Last Assessment & Plan 03/31/2014 Office Visit Edited 04/01/2014  4:51 PM  by Tiffany Kocher, PA-C    Continued weight loss, over 60 pounds at this point. Ongoing anorexia, vomiting, chronic abdominal pain, diarrhea. History of chronic iron deficiency anemia although her hemoglobin looks good at this time. Dietary recall reveals patient is consuming limited fluid and solid food concerning to her weight loss. Extensive workup as outlined above. Not clear if gastroparesis is the primary culprit for her anorexia weight loss, consider mesenteric ischemia as well. Previously advised to try pancreatic enzymes but she really has not pursued this option. Has developed tardive dyskinesia on Reglan. Discussed at length with family/patient and they would like to try coming off the medication.  1. Take Zofran Three times per day before meals. 2. Take Marinol twice per day before meals. 3. Take Zenpep 2 with meals and 1 with snacks. Samples provided. If they help your diarrhea, call for samples. 4. Stop Reglan due to abnormal tongue, mouth motions. 5. Please try to sip on liquids all day to make your urine light yellow. You need to attempt to eat a couple of Boost or Ensure daily. Eat small snacks every 2 to 3 hours. You will continue to lose weight if you don't try to eat.  I will discuss with Dr. Darrick Penna. Further recommendations to follow.        Lower abdominal pain   Anorexia  nervosa   Encounter for therapeutic drug monitoring   Chest pain at rest   Obesity   Chest pain   Protein-calorie malnutrition, severe (HCC)   Anemia   Last Assessment & Plan 09/16/2014 Office Visit Written 09/21/2014 12:23 AM by Tiffany Kocher, PA-C    Labs planned for next week. Change in anemia in part due to recurrent epistaxis. Last TCS and small givens capsule in 2015. Last EGD 2013. Await labs.       Ascites   Last Assessment & Plan 08/27/2014 Office Visit Written 08/27/2014 10:48 AM by Dyann Kief, PA-C    Patient has significant ascites and lower extremity edema with 30 pound weight gain. I  discussed possible hospitalization with her but she would like to try outpatient treatment first. We will begin Lasix 40 mg twice a day increase her potassium to 20 mEq 4 times a day. If she does not improve in the next 2 days she is advised to go to the emergency room to be admitted for IV diuresis. 2 g sodium diet. Follow-up with Dr. Wyline Mood next week. We are trying to obtain blood work that was performed on her yesterday.      Diffuse abdominal pain   Last Assessment & Plan 09/16/2014 Office Visit Written 09/21/2014 12:13 AM by Tiffany Kocher, PA-C    New abd pain for four weeks associated with new-onset ascites of unclear etiology, ?related to cardiac etiology. Hepatomegaly noted on recent CT. Abdominal pain worse with BM. To discuss further with Dr. Darrick Penna with regards to pain, new-onset ascites. Further recommendations to follow.       Low hemoglobin   Dehydration   IDA (iron deficiency anemia)   Fall at home       Imaging: Mr Knee Right Wo Contrast  Result Date: 03/19/2016 CLINICAL DATA:  Right knee pain after fall at home in the kitchen 1 year ago. EXAM: MRI OF THE RIGHT KNEE WITHOUT CONTRAST TECHNIQUE: Multiplanar, multisequence MR imaging of the knee was performed. No intravenous contrast was administered. COMPARISON:  Radiographs from 03/02/2016 FINDINGS: Despite efforts by the technologist and patient, motion artifact is present on today's exam and could not be eliminated. This reduces exam sensitivity and specificity. MENISCI Medial meniscus: Grade 3 oblique tear involving the superior surface of the posterior horn medial meniscus on image 9/7, and extending towards the inferior surface. Considerable adjacent grade 2 signal in the posterior horn. Lateral meniscus: Grade 3 oblique tear in the anterior horn lateral meniscus extends to the meniscal periphery and superior meniscal surface, with a peripheral 1.5 by 0.8 by 2.1 cm (volume = 1 cm^3) parameniscal cyst. There is also horizontal  signal in the anterior horn and midbody lateral meniscus extending to the free edge. LIGAMENTS Cruciates:  Unremarkable Collaterals:  Unremarkable CARTILAGE Patellofemoral:  Moderate degenerative chondral thinning. Medial:  Moderate to prominent degenerative chondral thinning. Lateral:  Moderate degenerative chondral thinning. Joint:  No significant knee effusion. Popliteal Fossa:  Unremarkable Extensor Mechanism:  Unremarkable Bones: No significant extra-articular osseous abnormalities identified. Other: No supplemental non-categorized findings. IMPRESSION: 1. Grade 3 oblique tear anterior horn lateral meniscus involving the superior surface and meniscal periphery, with a 1 cc parameniscal cyst tracking along the periphery. There is also horizontal grade 3 signal in the anterior horn and midbody of the lateral meniscus extending to the free edge. 2. Subtle oblique grade 3 tear involving the superior surface of the posterior horn medial meniscus. 3. Tricompartmental moderate degenerative chondral thinning. 4. Despite efforts  by the technologist and patient, motion artifact is present on today's exam and could not be eliminated. This reduces exam sensitivity and specificity. Electronically Signed   By: Gaylyn RongWalter  Liebkemann M.D.   On: 03/19/2016 08:55

## 2016-04-19 IMAGING — CR DG CHEST 2V
2 series · 2 of 2 positions shown · non-contrast
Comparison: CT chest dated 10/13/2013

CLINICAL DATA: Chest pain, CHF

EXAM:
CHEST  2 VIEW

[view not recorded (1 of 2)]
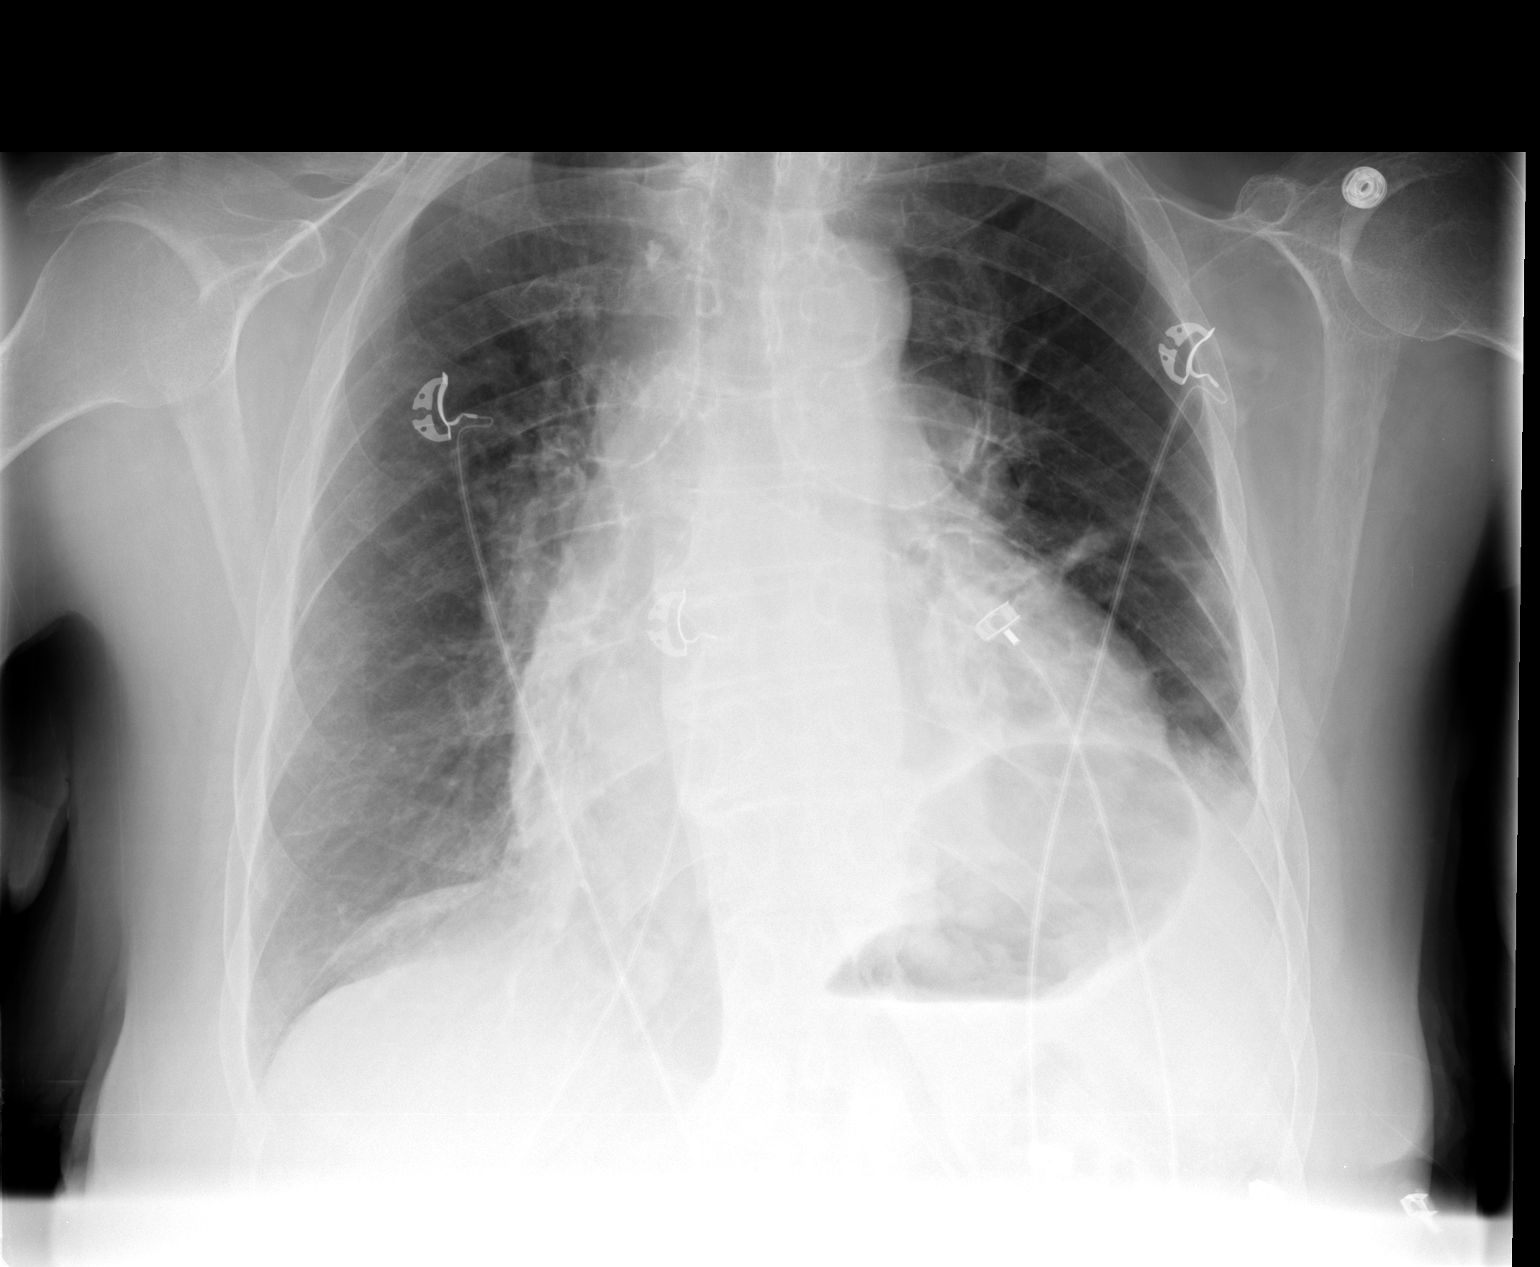

[view not recorded (2 of 2)]
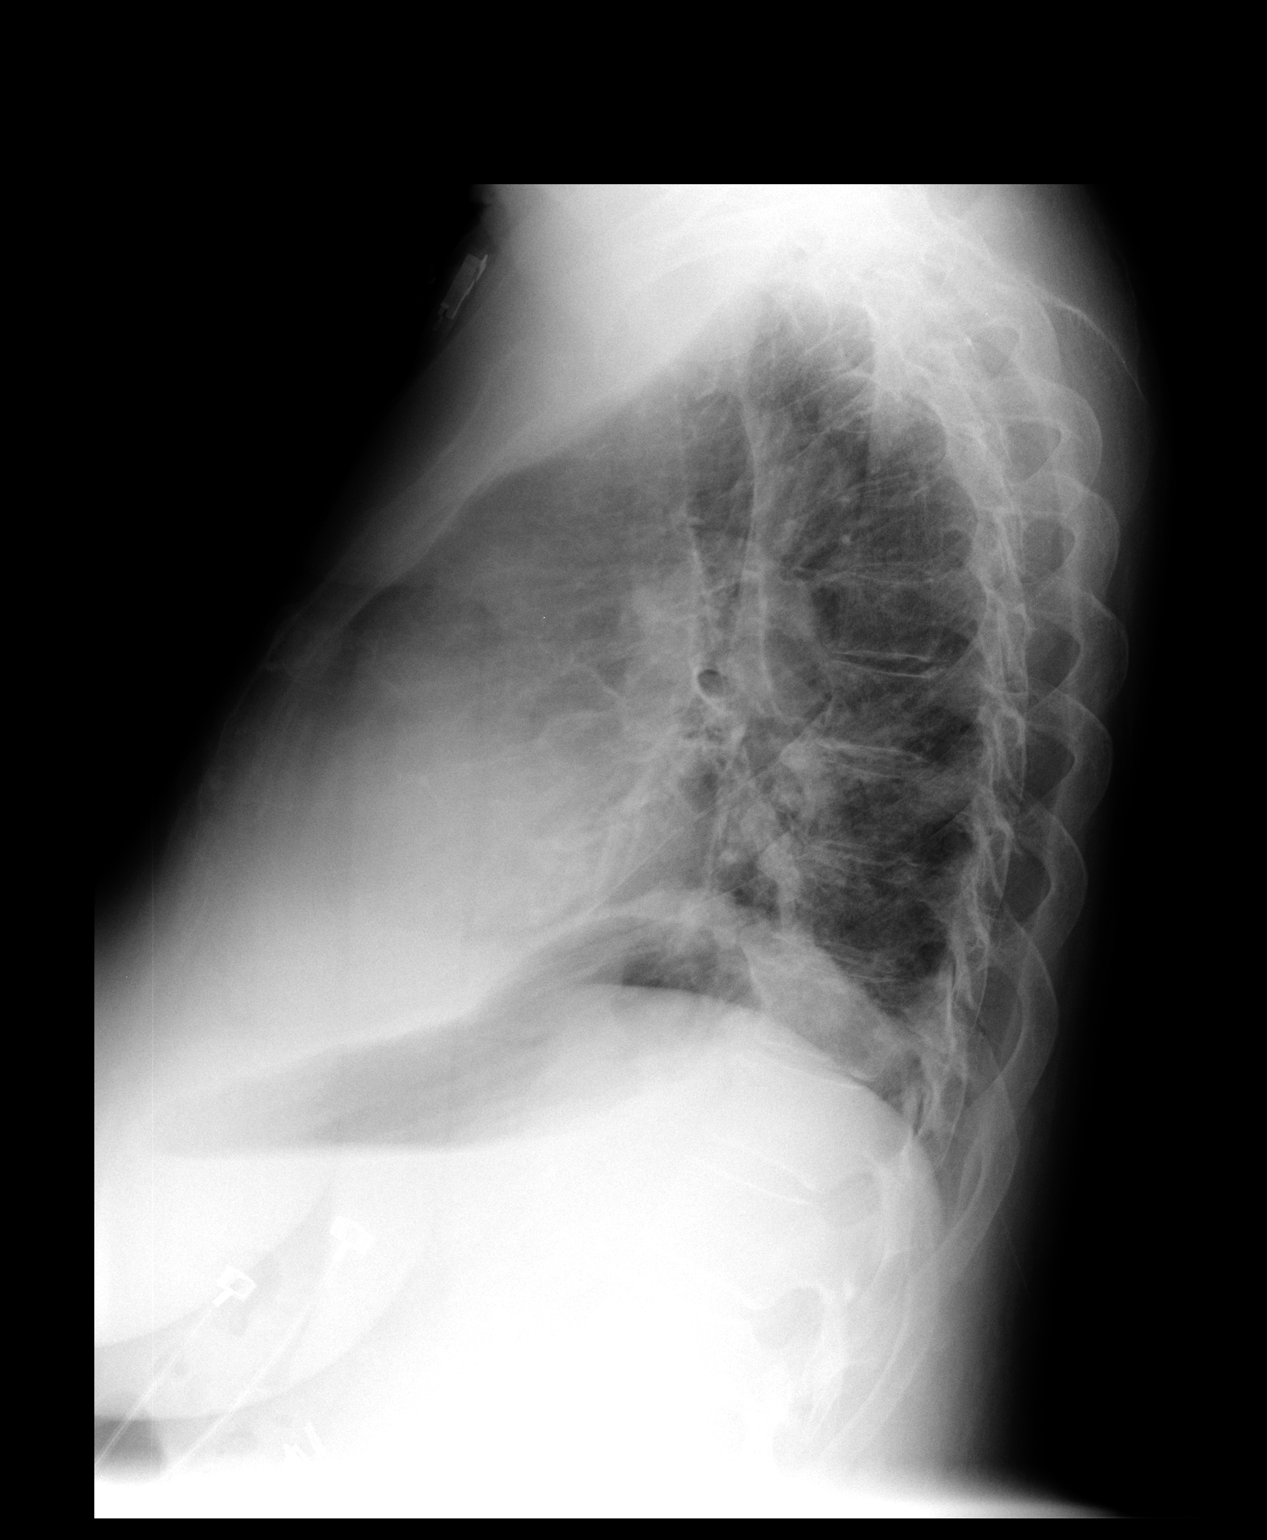

[2 of 2 positions shown; findings below may reference images not displayed]

FINDINGS: No frank interstitial edema. Elevation of the left hemidiaphragm
with possible small left pleural effusion. No pneumothorax.

Stable cardiomegaly.

Degenerative changes of the visualized thoracolumbar spine.
IMPRESSION: No frank interstitial edema.

Possible small left pleural effusion.

## 2016-04-28 ENCOUNTER — Telehealth: Payer: Self-pay | Admitting: Orthopedic Surgery

## 2016-04-28 NOTE — Telephone Encounter (Signed)
Called patient/daughter; scheduled accordingly.

## 2016-04-28 NOTE — Telephone Encounter (Signed)
[id number]

## 2016-04-28 NOTE — Telephone Encounter (Signed)
Patient's daughter, designated party contact, [name], ph# [phone], called to follow up on status of getting appointment set up with Dr Romeo Apple, following visit with Dr Hilda Lias. States we've received a letter of clearance per her visit with cardiologist - nurse confirmed we have received.  Please advise of appointment day and time to discuss knee arthroscopy.

## 2016-04-29 DIAGNOSIS — H52223 Regular astigmatism, bilateral: Secondary | ICD-10-CM | POA: Diagnosis not present

## 2016-04-29 DIAGNOSIS — E113293 Type 2 diabetes mellitus with mild nonproliferative diabetic retinopathy without macular edema, bilateral: Secondary | ICD-10-CM | POA: Diagnosis not present

## 2016-04-29 DIAGNOSIS — H524 Presbyopia: Secondary | ICD-10-CM | POA: Diagnosis not present

## 2016-04-29 DIAGNOSIS — H5203 Hypermetropia, bilateral: Secondary | ICD-10-CM | POA: Diagnosis not present

## 2016-05-05 IMAGING — CR DG CHEST 1V PORT
1 series · 1 of 1 positions shown · non-contrast
Comparison: 12/03/2013

CLINICAL DATA: Left-sided chest pain and shortness of breath since
[DATE] p.m.. History of CHF, hypertension, diabetes.

EXAM:
PORTABLE CHEST - 1 VIEW

[portable]
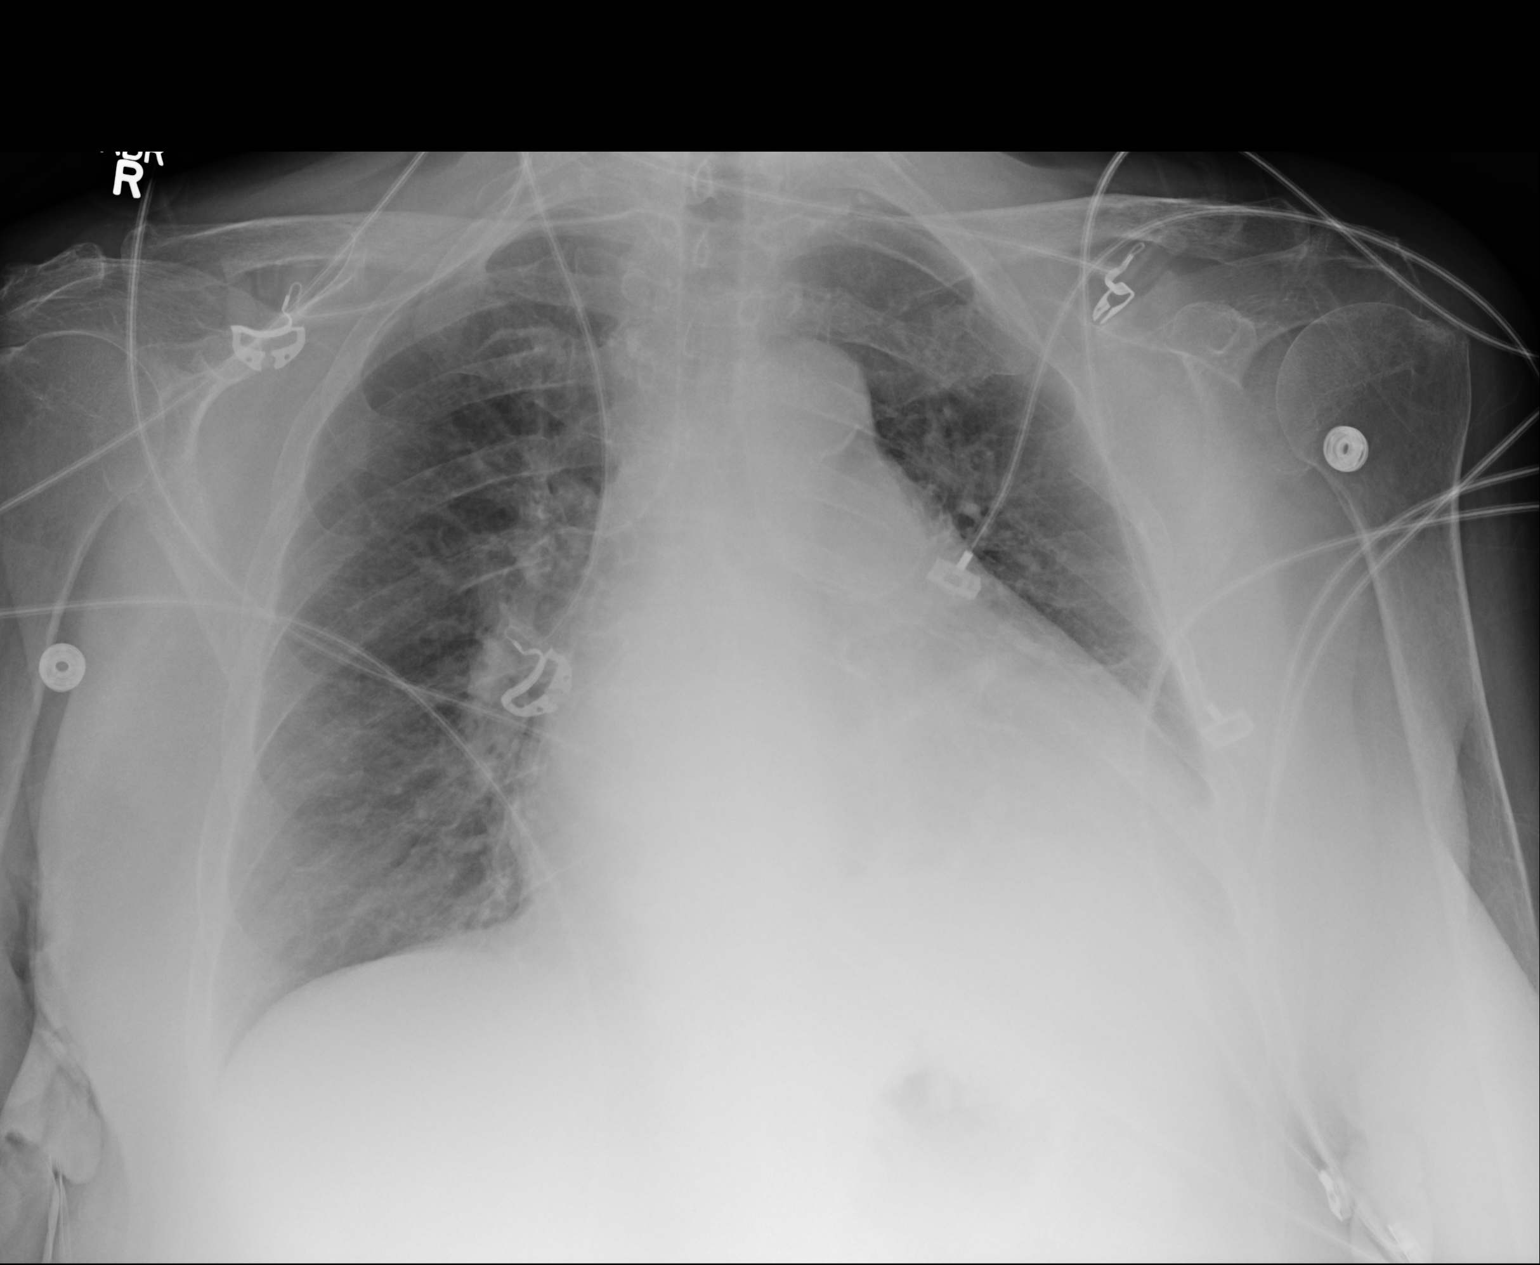

[1 of 1 positions shown; findings below may reference images not displayed]

FINDINGS: The heart is enlarged. There is left lung base density which
partially obscures the hemidiaphragm. Findings are consistent with
atelectasis or mild infiltrate. Aeration appears improved since
exams earlier in the year. There is mild chronic bronchitic change.
No overt edema.
IMPRESSION: 1. Cardiomegaly, stable.
2. Persistent but improved density in the left lung base.

## 2016-05-08 IMAGING — US US EXTREM LOW VENOUS*R*
1 series · 13 of 24 positions shown · non-contrast
Comparison: None

CLINICAL DATA: RIGHT lower extremity swelling, history CHF,
diabetes, hypertension, paroxysmal atrial fibrillation former
smoker, on anticoagulation



[Series 1: us extrem low venous*right* · 0.08mm/px · 13 of 39 slices shown]
[im 1/39]
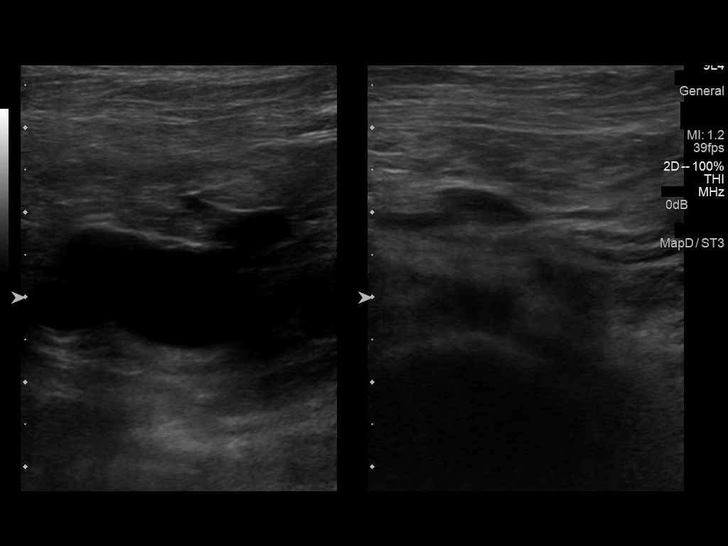
[im 4/39]
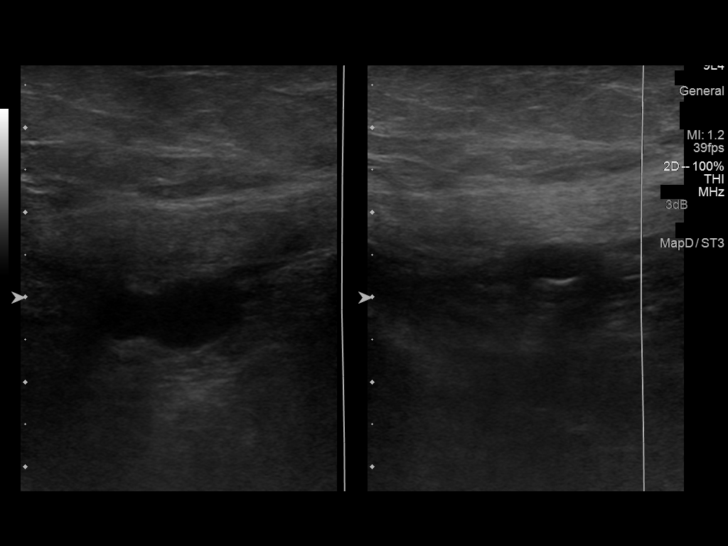
[im 7/39]
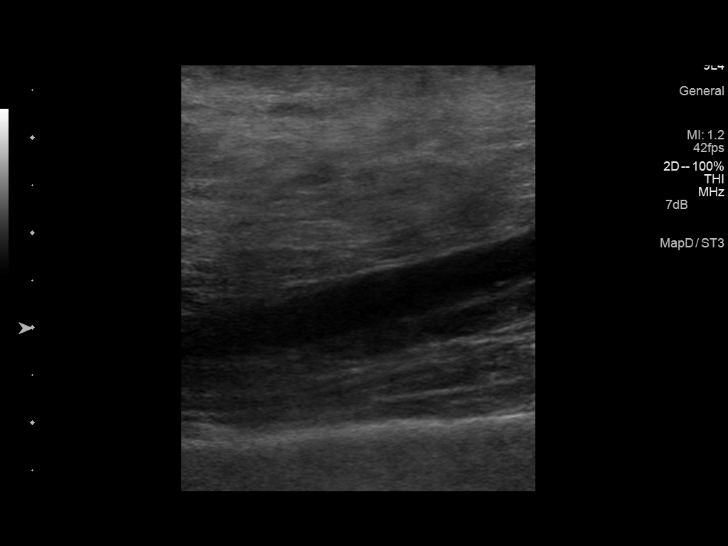
[im 10/39]
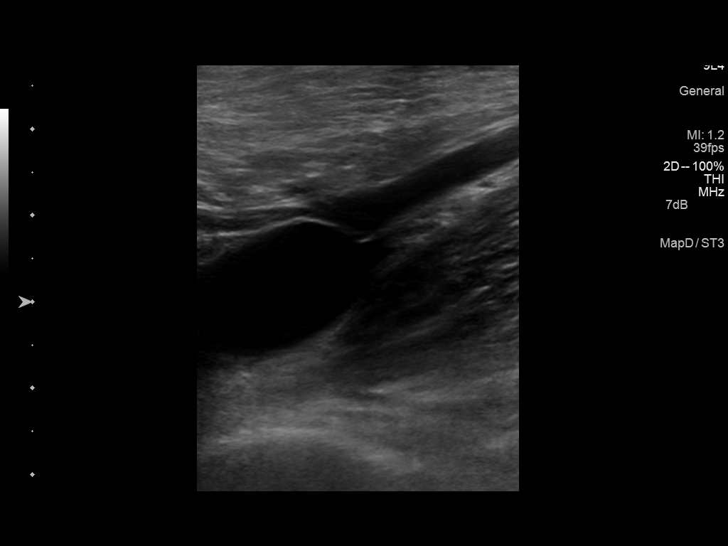
[im 14/39]
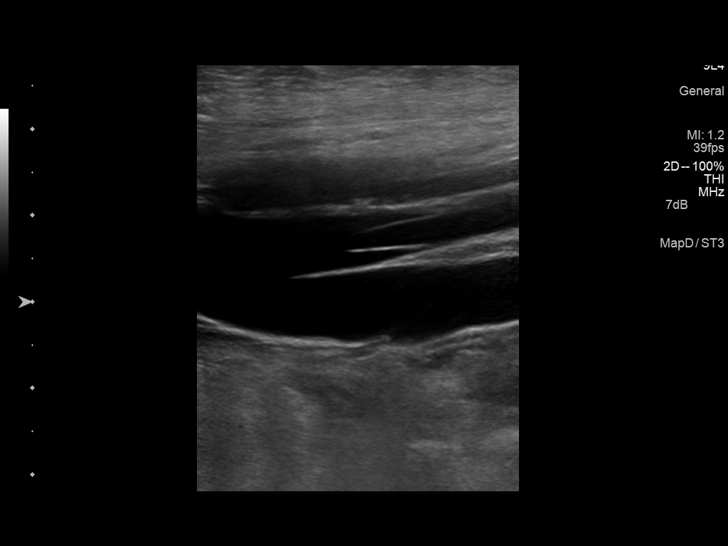
[im 17/39]
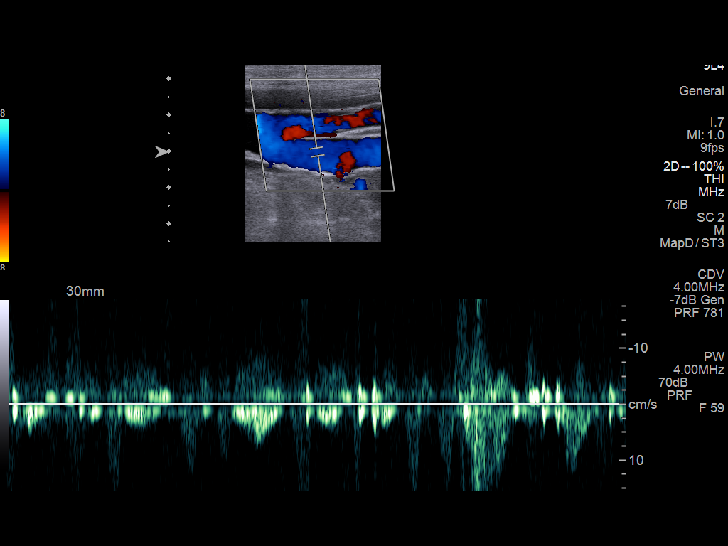
[im 20/39]
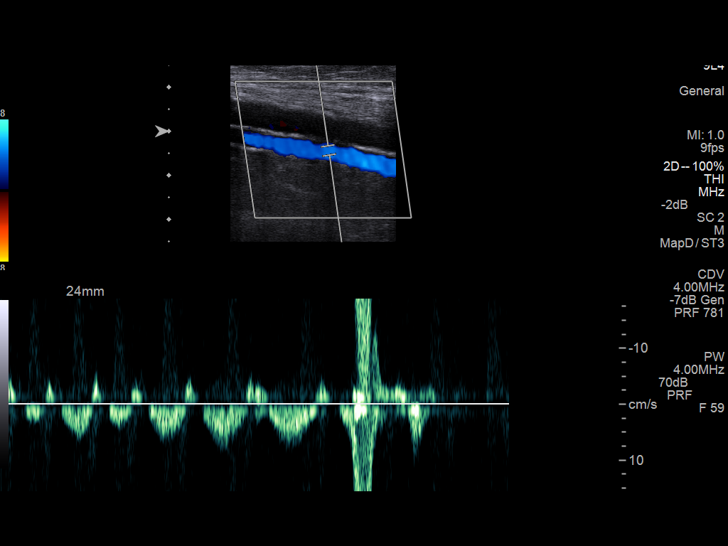
[im 22/39]
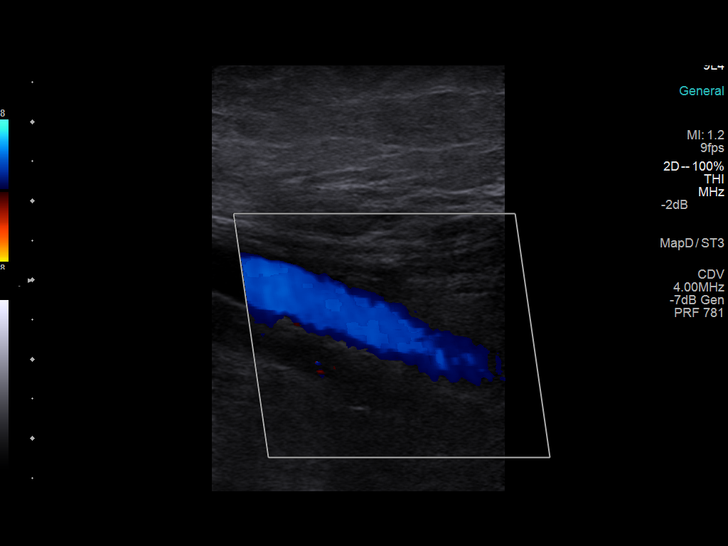
[im 25/39]
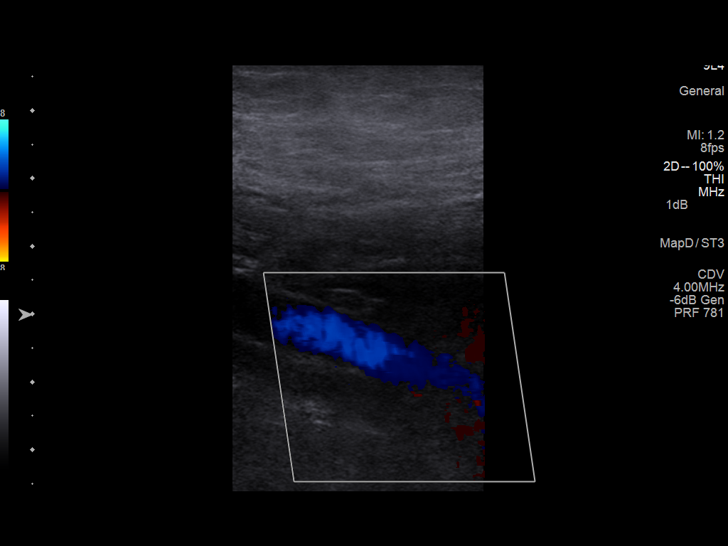
[im 29/39]
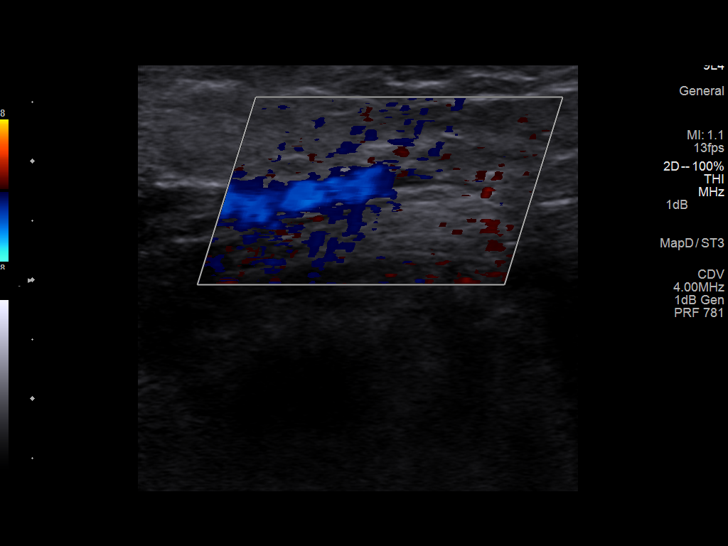
[im 32/39]
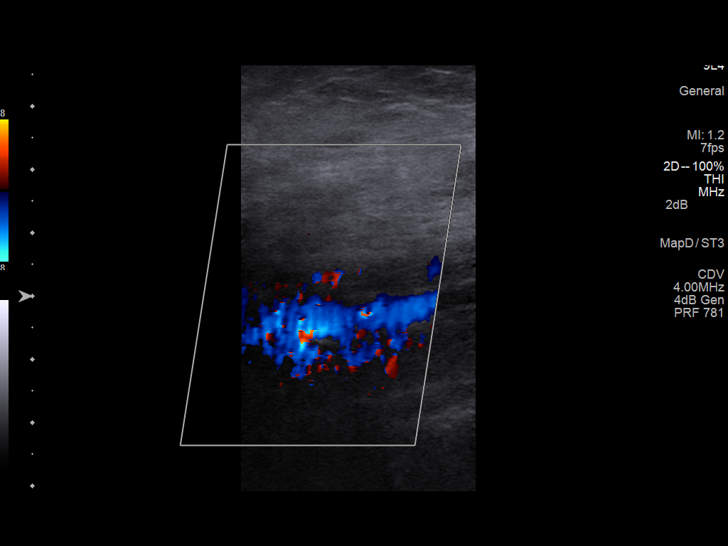
[im 35/39]
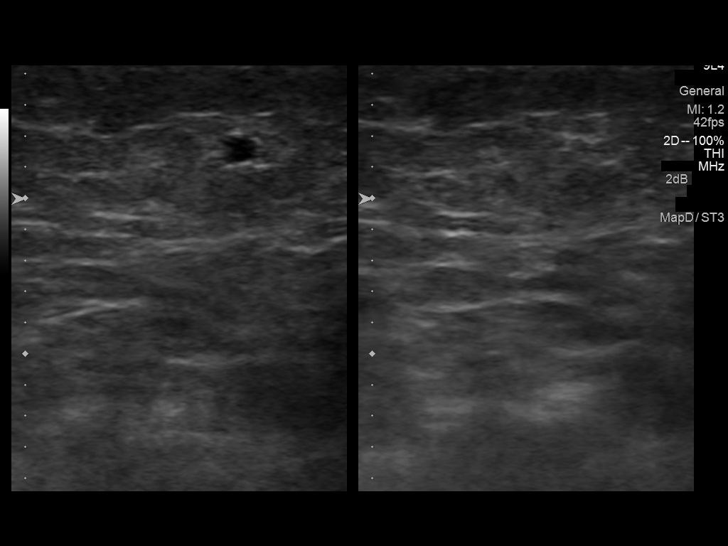
[im 39/39]
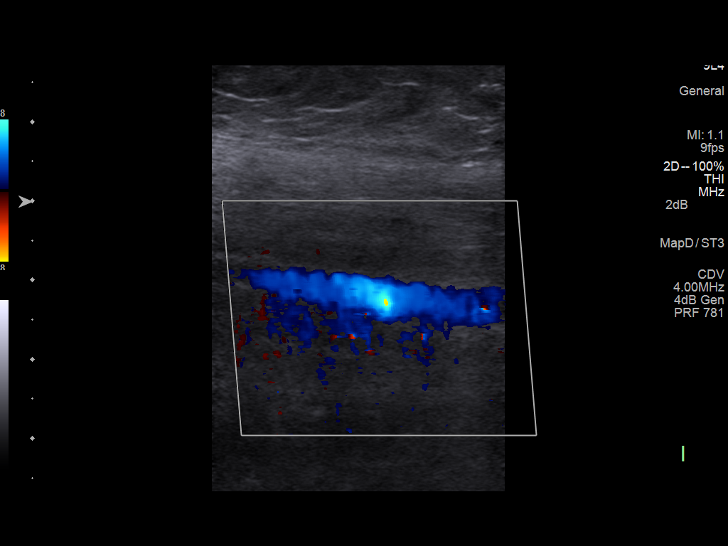

[13 of 24 positions shown; findings below may reference images not displayed]

FINDINGS: Common Femoral Vein: No evidence of thrombus. Normal compressibility
and flow on color Doppler imaging.

Saphenofemoral Junction: No evidence of thrombus. Normal
compressibility and flow on color Doppler imaging.

Profunda Femoral Vein: No evidence of thrombus. Normal
compressibility and flow on color Doppler imaging.

Femoral Vein: No evidence of thrombus. Normal compressibility,
respiratory phasicity and response to augmentation.

Popliteal Vein: No evidence of thrombus. Normal compressibility,
respiratory phasicity and response to augmentation.

Calf Veins: No evidence of thrombus. Normal compressibility and flow
on color Doppler imaging.

Superficial Great Saphenous Vein: No evidence of thrombus. Normal
compressibility and flow on color Doppler imaging.

Venous Reflux:  None.

Other Findings:  None.
IMPRESSION: No evidence of deep venous thrombosis in the RIGHT lower extremity.

## 2016-05-11 ENCOUNTER — Ambulatory Visit (INDEPENDENT_AMBULATORY_CARE_PROVIDER_SITE_OTHER): Payer: Medicare Other | Admitting: Orthopedic Surgery

## 2016-05-11 ENCOUNTER — Encounter: Payer: Self-pay | Admitting: Orthopedic Surgery

## 2016-05-11 VITALS — BP 125/70 | HR 59 | Ht 67.0 in | Wt 242.0 lb

## 2016-05-11 DIAGNOSIS — M23300 Other meniscus derangements, unspecified lateral meniscus, right knee: Secondary | ICD-10-CM | POA: Diagnosis not present

## 2016-05-11 DIAGNOSIS — M23321 Other meniscus derangements, posterior horn of medial meniscus, right knee: Secondary | ICD-10-CM

## 2016-05-11 NOTE — Addendum Note (Signed)
Addended by: Adella Hare B on: 05/11/2016 03:49 PM   Modules accepted: Orders, SmartSet

## 2016-05-11 NOTE — Progress Notes (Signed)
Patient ID: [name], female   DOB: [birth year], 65 y.o.   MRN: [id number]  Chief Complaint  Patient presents with  . Follow-up    RIGHT KNEE, DISCUSS SURGERY    HPI [name] is a 64 y.o. female.  PRESENTS FOR EVAL RIGHT KNEE FOR SURGERY \ THE HISTORY BY DR Hilda Lias WAS CONFIRMED  HPI She had a fall last June, 2016 and hurt her right proximal fibula with a nondisplaced fracture.  Since then she has had right knee pain with swelling, popping.  More recently she has had giving way of the right knee and more swelling.  She feels it might buckle at anytime. She uses a cane now to catch herself from falling.  She has no redness.  She has tried ice, heat, rest, cane and Advil with little help.  She has no numbness.   Review of Systems Review of Systems (PER DR KEELING'S NOTE ) Review of Systems  HENT: Negative for congestion.   Respiratory: Negative for cough and shortness of breath.   Cardiovascular: Negative for chest pain and leg swelling.  Endocrine: Positive for cold intolerance.  Musculoskeletal: Positive for arthralgias, gait problem and joint swelling.  Allergic/Immunologic: Positive for environmental allergies.    Past Medical History:  Diagnosis Date  . Anemia    H/H of 10/30 with a normal MCV in 12/09  . Barrett's esophagus    Diagnosed 1995. Last EGD 2016-NO BARRETT'S.   . Chest pain    Negative cardiac catheterization in 2002; negative stress nuclear study in 2008  . Chronic anticoagulation   . Chronic diastolic CHF (congestive heart failure) (HCC)    a. EF predominantly normal during prior echoes but was 40% during 10/2014 echo. b. Most recent 01/2015 EF normal, 55-60%.  . Chronic LBP    Surgical intervention in 1996  . Diabetes mellitus, type 2 (HCC)    Insulin therapy; exacerbated by prednisone  . Gastroparesis    99% retention 05/2008 on GES  . GERD (gastroesophageal reflux disease)   . Hiatal hernia   . Hyperlipidemia   . Hypertension   . Hypothyroid    . IBS (irritable bowel syndrome)   . Obesity   . OSA on CPAP   . Paroxysmal atrial fibrillation (HCC)   . Pulmonary hypertension 01/2015   a. Predominantly pulmonary venous hypertension but may be component of PAH.  . Seizures (HCC)   . Syncope    a. Admitted 05/2009; magnetic resonance imagin/ MRA - negative; etiology thought to be orthostasis secondary to drugs and dehydration. b. Syncope 02/2015 also felt 2/2 dehydration.    Past Surgical History:  Procedure Laterality Date  . BACK SURGERY  1996  . BIOPSY N/A 11/08/2013   Procedure: BIOPSY  / Tissue sampling / ulcers present in small intestine;  Surgeon: West Bali, MD;  Location: [facility];  Service: Endoscopy;  Laterality: N/A;  . CARDIAC CATHETERIZATION  2002  . CARDIAC CATHETERIZATION N/A 01/26/2015   Procedure: Right Heart Cath;  Surgeon: Laurey Morale, MD;  Location: [facility] INVASIVE CV LAB;  Service: Cardiovascular;  Laterality: N/A;  . CARDIOVASCULAR STRESS TEST  2008   Stress nuclear study  . CARDIOVERSION N/A 03/06/2015   Procedure: CARDIOVERSION;  Surgeon: Laurey Morale, MD;  Location: [facility] ENDOSCOPY;  Service: Cardiovascular;  Laterality: N/A;  . CARPAL TUNNEL RELEASE  1994  . COLONOSCOPY  11/2011   Dr. Darrick Penna: Internal hemorrhoids, mild diverticulosis. Random colon biopsies negative.  . COLONOSCOPY N/A 11/08/2013  SLF: Normal mucosa in the terminal ileum/The colon IS redundant/  Moderate diverticulosis throughout the entire colon. ileum bx benign. colon bx benign  . ESOPHAGOGASTRODUODENOSCOPY  2008   Barrett's without dysplasia. esphagus dilated. antral erosions, h.pylori serologies negative.  . ESOPHAGOGASTRODUODENOSCOPY  11/2011   Dr. Darrick Penna: Barrett's esophagus, mild gastritis, diverticulum in the second portion of the duodenum repeat EGD 3 years. Small bowel biopsies negative. Gastric biopsy show reactive gastropathy but no H. pylori. Esophageal biopsies consistent with GERD. Next EGD 11/2014  .  ESOPHAGOGASTRODUODENOSCOPY N/A 11/21/2014   BTY:OMAY non-erosive gastritis/irregular z-line  . GIVENS CAPSULE STUDY  12/07/2011   Proximal small bowel, rare AVM. Distal small bowel, multiple ulcers noted  . GIVENS CAPSULE STUDY N/A 09/27/2013   Distal small bowel ulcers extending to TI.  Marland Kitchen GIVENS CAPSULE STUDY N/A 10/10/2013   Procedure: GIVENS CAPSULE STUDY;  Surgeon: West Bali, MD;  Location: [facility];  Service: Endoscopy;  Laterality: N/A;  7:30  . LAMINECTOMY  1995   L4-L5  . LAPAROSCOPIC CHOLECYSTECTOMY  1990s  . LEFT HEART CATHETERIZATION WITH CORONARY ANGIOGRAM  01/10/2014   Procedure: LEFT HEART CATHETERIZATION WITH CORONARY ANGIOGRAM;  Surgeon: Lesleigh Noe, MD;  Location: [facility] CATH LAB;  Service: Cardiovascular;;  . RIGHT HEART CATHETERIZATION N/A 01/10/2014   Procedure: RIGHT HEART CATH;  Surgeon: Lesleigh Noe, MD;  Location: [facility] CATH LAB;  Service: Cardiovascular;  Laterality: N/A;  . TOTAL ABDOMINAL HYSTERECTOMY  1999    Social History Social History  Substance Use Topics  . Smoking status: Former Smoker    Packs/day: 0.25    Years: 15.00    Types: Cigarettes    Start date: 02/26/1966    Quit date: 07/01/1983  . Smokeless tobacco: Never Used     Comment: quit in 1984  . Alcohol use No     Comment: last etoh one year ago, never frequent    Allergies  Allergen Reactions  . Ibuprofen Other (See Comments)    Upsets her stomach and causes abdominal pain  . Codeine Nausea And Vomiting and Other (See Comments)    HALLUCINATIONS  . Dilaudid [Hydromorphone Hcl] Other (See Comments)    Made her pass out  . Celexa [Citalopram Hydrobromide] Other (See Comments)    Dyskinesia  . Reglan [Metoclopramide] Other (See Comments)    DYSKINESIA    Current Meds  Medication Sig  . acetaminophen (TYLENOL) 500 MG tablet Take 500 mg by mouth every 6 (six) hours as needed for moderate pain or headache.  Marland Kitchen amiodarone (PACERONE) 200 MG tablet TAKE 1 TABLET BY MOUTH EVERY DAY   . apixaban (ELIQUIS) 5 MG TABS tablet Take 5 mg by mouth 2 (two) times daily.  Marland Kitchen dicyclomine (BENTYL) 10 MG capsule 1-2 capsules before meals as needed for abdominal cramping and loose stool  . dronabinol (MARINOL) 2.5 MG capsule Take 2.5 mg by mouth 2 (two) times daily before a meal.  . DULoxetine (CYMBALTA) 30 MG capsule Take 30 mg by mouth 2 (two) times daily.  . famotidine (PEPCID) 20 MG tablet Take 20 mg by mouth at bedtime.   . furosemide (LASIX) 40 MG tablet Take 1 tablet (40 mg total) by mouth daily. (Patient taking differently: Take 20 mg by mouth daily. )  . gabapentin (NEURONTIN) 300 MG capsule Take 300 mg by mouth 3 (three) times daily.  Marland Kitchen glipiZIDE (GLUCOTROL) 10 MG tablet Take 10 mg by mouth 2 (two) times daily before a meal.   . HYDROcodone-acetaminophen (NORCO/VICODIN)  5-325 MG tablet Take 1 tablet by mouth every 6 (six) hours as needed for moderate pain.  . Insulin Glargine (LANTUS SOLOSTAR) 100 UNIT/ML Solostar Pen Inject 20-50 Units into the skin at bedtime as needed. Patient takes 20 units every night at bedtime if over 178.  Takes 50 units if her blood sugar is above 300.  . Iron Polysacch Cmplx-B12-FA 150-0.025-1 MG CAPS TAKE 1 TABLET DAILY  . levETIRAcetam (KEPPRA) 750 MG tablet Take 750 mg by mouth 2 (two) times daily.  Marland Kitchen. levothyroxine (SYNTHROID, LEVOTHROID) 137 MCG tablet Take 137 mcg by mouth daily.  Marland Kitchen. loperamide (IMODIUM) 2 MG capsule Take 1 capsule (2 mg total) by mouth as needed for diarrhea or loose stools.  Marland Kitchen. LORazepam (ATIVAN) 1 MG tablet Take 1 mg by mouth 3 (three) times daily as needed for anxiety or sleep. For anxiety  . magnesium oxide (MAG-OX) 400 MG tablet Take 400 mg by mouth daily.   . metaxalone (SKELAXIN) 800 MG tablet Take 800 mg by mouth 3 (three) times daily.   . metFORMIN (GLUCOPHAGE) 500 MG tablet Take 1,000 mg by mouth 2 (two) times daily with a meal.   . Multiple Vitamin (MULTIVITAMIN WITH MINERALS) TABS tablet Take 1 tablet by mouth daily.  .  nitroGLYCERIN (NITROSTAT) 0.4 MG SL tablet Place 0.4 mg under the tongue every 5 (five) minutes as needed for chest pain. Reported on 07/08/2015  . ondansetron (ZOFRAN) 4 MG tablet Take 1 tablet (4 mg total) by mouth 4 (four) times daily -  before meals and at bedtime. As needed for nausea.  . pantoprazole (PROTONIX) 40 MG tablet Take 1 tablet (40 mg total) by mouth 2 (two) times daily before a meal.  . potassium chloride (K-DUR) 10 MEQ tablet TAKE 2 TABLETS BY MOUTH TWICE DAILY FOR LOW POTASSIUM  . vitamin B-12 (CYANOCOBALAMIN) 500 MCG tablet Take 500 mcg by mouth daily.      Physical Exam Physical Exam BP 125/70   Pulse (!) 59   Ht 5\' 7"  (1.702 m)   Wt 242 lb (109.8 kg)   BMI 37.90 kg/m   Gen. appearance. The patient is well-developed and well-nourished, grooming and hygiene are normal. There are no gross congenital abnormalities  The patient is alert and oriented to person place and time  Mood and affect are normal  Ambulation WITH CANE MILD LIMP   Examination reveals the following: On inspection we find MEDIAL AND LATERAL JOINT LINE TENDERNESS  With the range of motion of  110  Stability tests were normal    Strength tests revealed grade 5 motor strength  POSITIVE MCMURRAY MEDIAL , MILD LATERAL  Skin we find no rash ulceration or erythema  Sensation remains intact  Impression vascular system shows no peripheral edema  Data Reviewed MRI AND PLAIN FILM  X-rays were done of the right knee, three views   There is degenerative changes of the right knee in all three compartment, moderate, with lateral narrowing, osteophytes present.  Old healed nondisplaced fracture of proximal fibula is present.  No acute fracture seen.  Slight effusion is present.   Impression:  Moderate degenerative joint disease of the right knee, more laterally.  Effusion present.  Old healed proximal fibula fracture.   Electronically Signed Darreld McleanWayne Keeling, MD 8/30/20173:27  PM  MRI IMPRESSION: 1. Grade 3 oblique tear anterior horn lateral meniscus involving the superior surface and meniscal periphery, with a 1 cc parameniscal cyst tracking along the periphery. There is also horizontal grade 3 signal in  the anterior horn and midbody of the lateral meniscus extending to the free edge. 2. Subtle oblique grade 3 tear involving the superior surface of the posterior horn medial meniscus. 3. Tricompartmental moderate degenerative chondral thinning. 4. Despite efforts by the technologist and patient, motion artifact is present on today's exam and could not be eliminated. This reduces exam sensitivity and specificity.   Assessment    Encounter Diagnoses  Name Primary?  . Derangement of posterior horn of medial meniscus of right knee Yes  . Meniscus, lateral, derangement, right        Plan    SARK MEDIAL AND LATERAL MENISECTOMY   This procedure has been fully reviewed with the patient and written informed consent has been obtained.        Fuller Canada 05/11/2016, 9:33 AM

## 2016-05-11 NOTE — Patient Instructions (Addendum)
SURGERY : SARK MEDIAL AND LATERAL MENISECTOMY   Knee Arthroscopy Knee arthroscopy is a surgical procedure that is used to examine the inside of your knee joint and repair any damage. The surgeon puts a small, lighted instrument with a camera on the tip (arthroscope) through a small incision in your knee. The camera sends pictures to a monitor in the operating room. Your surgeon uses those pictures to guide the surgical instruments through other incisions to the area of damage. Knee arthroscopy can be used to treat many types of knee problems. It may be used:  To repair a torn ligament.  To repair or remove damaged tissue.  To remove a fluid-filled sac (cyst) from your knee. LET [facility] PROVID[facility] KNOW ABOUT:  Any allergies you have.  All medicines you are taking, including vitamins, herbs, eye drops, creams, and over-the-counter medicines.  Previous problems you or members of your family have had with the use of anesthetics.  Any blood disorders you have.  Previous surgeries you have had.  Any medical conditions you may have. RISKS AND COMPLICATIONS Generally, this is a safe procedure. However, problems may occur, including:  Infection.  Bleeding.  Damage to blood vessels, nerves, or structures of your knee.  A blood clot that forms in your leg and travels to your lung.  Failure to relieve symptoms. BEFORE THE PROCEDURE  Ask your health care provider about:  Changing or stopping your regular medicines. This is especially important if you are taking diabetes medicines or blood thinners.  Taking medicines such as aspirin and ibuprofen. These medicines can thin your blood. Do not take these medicines before your procedure if your health care provider instructs you not to.  Follow your health care provider's instructions about eating or drinking restrictions.  Plan to have someone take you home after the procedure.  If you go home right after the procedure, plan to  have someone with you for 24 hours.  Do not drink alcohol unless your health care provider says that you can.  Do not use any tobacco products, including cigarettes, chewing tobacco, or electronic cigarettes unless your health care provider says that you can. If you need help quitting, ask your health care provider.  You may have a physical exam. PROCEDURE  An IV tube will be inserted into one of your veins.  You will be given one or more of the following:  A medicine that helps you relax (sedative).  A medicine that numbs the area (local anesthetic).  A medicine that makes you fall asleep (general anesthetic).  A medicine that is injected into your spine that numbs the area below and slightly above the injection site (spinal anesthetic).  A medicine that is injected into an area of your body that numbs everything below the injection site (regional anesthetic).  A cuff may be placed around your upper leg to slow bleeding during the procedure.  The surgeon will make a small number of incisions around your knee.  Your knee joint will be flushed and filled with a germ-free (sterile) solution.  The arthroscope will be passed through an incision into your knee joint.  More instruments will be passed through other incisions to repair your knee as needed.  The fluid will be removed from your knee.  The incisions will be closed with adhesive strips or stitches (sutures).  A bandage (dressing) will be placed over your knee. The procedure may vary among health care providers and hospitals. AFTER THE PROCEDURE  Your blood  pressure, heart rate, breathing rate and blood oxygen level will be monitored often until the medicines you were given have worn off.  You may be given medicine for pain.  You may get crutches to help you walk without using your knee to support your body weight.  You may have to wear compression stockings. These stocking help to prevent blood clots and reduce  swelling in your legs.   This information is not intended to replace advice given to you by your health care provider. Make sure you discuss any questions you have with your health care provider.   Document Released: 06/17/2000 Document Revised: 11/04/2014 Document Reviewed: 06/16/2014 Elsevier Interactive Patient Education 2016 [employer].   You have decided to proceed with operative arthroscopy of the knee. You have decided not to continue with nonoperative measures such as but not limited to oral medication, weight loss, activity modification, physical therapy, bracing, or injection.  We will perform operative arthroscopy of the knee. Some of the risks associated with arthroscopic surgery of the knee include but are not limited to Bleeding Infection Swelling Stiffness Blood clot Pain  If you're not comfortable with these risks and would like to continue with nonoperative treatment please let Dr. Romeo Apple know prior to your surgery.

## 2016-05-13 DIAGNOSIS — I4891 Unspecified atrial fibrillation: Secondary | ICD-10-CM | POA: Diagnosis not present

## 2016-05-13 DIAGNOSIS — E1140 Type 2 diabetes mellitus with diabetic neuropathy, unspecified: Secondary | ICD-10-CM | POA: Diagnosis not present

## 2016-05-13 DIAGNOSIS — R269 Unspecified abnormalities of gait and mobility: Secondary | ICD-10-CM | POA: Diagnosis not present

## 2016-05-13 DIAGNOSIS — M797 Fibromyalgia: Secondary | ICD-10-CM | POA: Diagnosis not present

## 2016-05-13 DIAGNOSIS — L851 Acquired keratosis [keratoderma] palmaris et plantaris: Secondary | ICD-10-CM | POA: Diagnosis not present

## 2016-05-13 DIAGNOSIS — B351 Tinea unguium: Secondary | ICD-10-CM | POA: Diagnosis not present

## 2016-05-13 DIAGNOSIS — I10 Essential (primary) hypertension: Secondary | ICD-10-CM | POA: Diagnosis not present

## 2016-05-13 DIAGNOSIS — G259 Extrapyramidal and movement disorder, unspecified: Secondary | ICD-10-CM | POA: Diagnosis not present

## 2016-05-13 DIAGNOSIS — G40309 Generalized idiopathic epilepsy and epileptic syndromes, not intractable, without status epilepticus: Secondary | ICD-10-CM | POA: Diagnosis not present

## 2016-05-13 DIAGNOSIS — Z79899 Other long term (current) drug therapy: Secondary | ICD-10-CM | POA: Diagnosis not present

## 2016-05-13 DIAGNOSIS — E1142 Type 2 diabetes mellitus with diabetic polyneuropathy: Secondary | ICD-10-CM | POA: Diagnosis not present

## 2016-05-15 IMAGING — CR DG PELVIS 1-2V
2 series · 2 of 2 positions shown · non-contrast
Comparison: CT 09/03/2013

CLINICAL DATA: FALL

EXAM:
PELVIS - 1-2 VIEW

[view not recorded (1 of 2)]
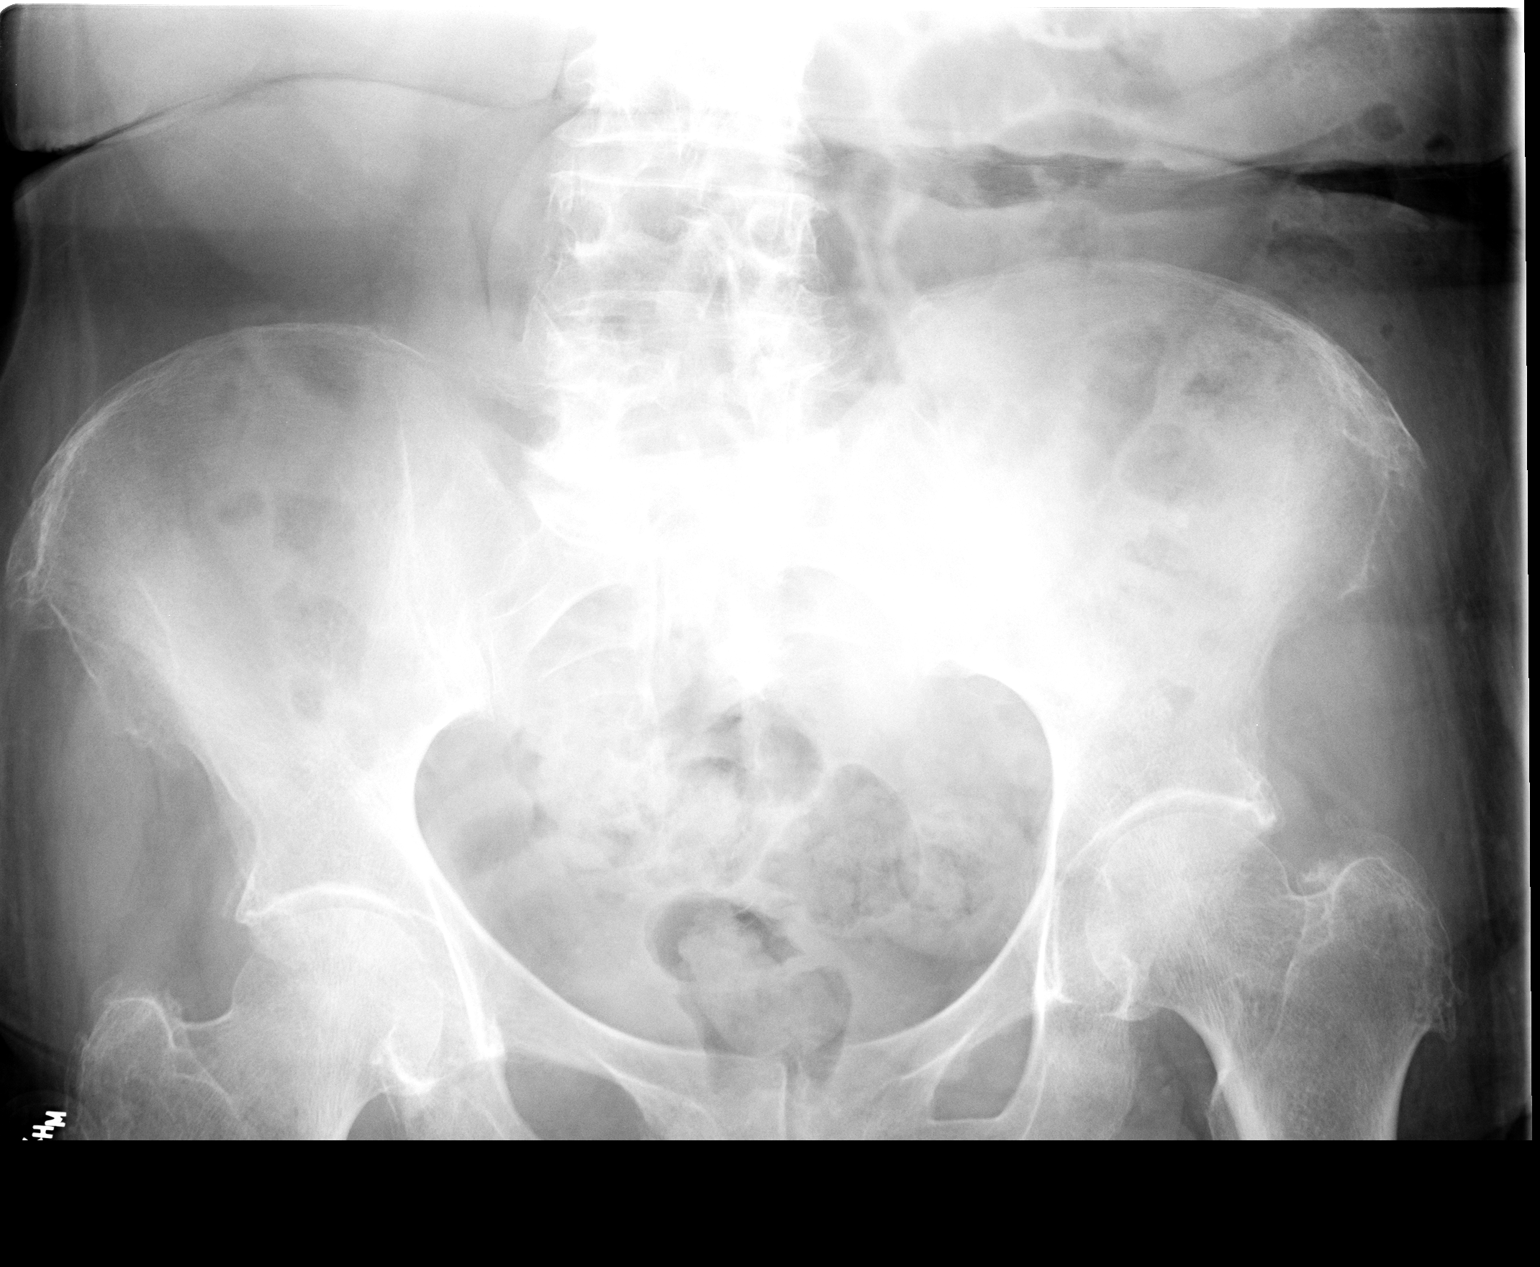

[view not recorded (2 of 2)]
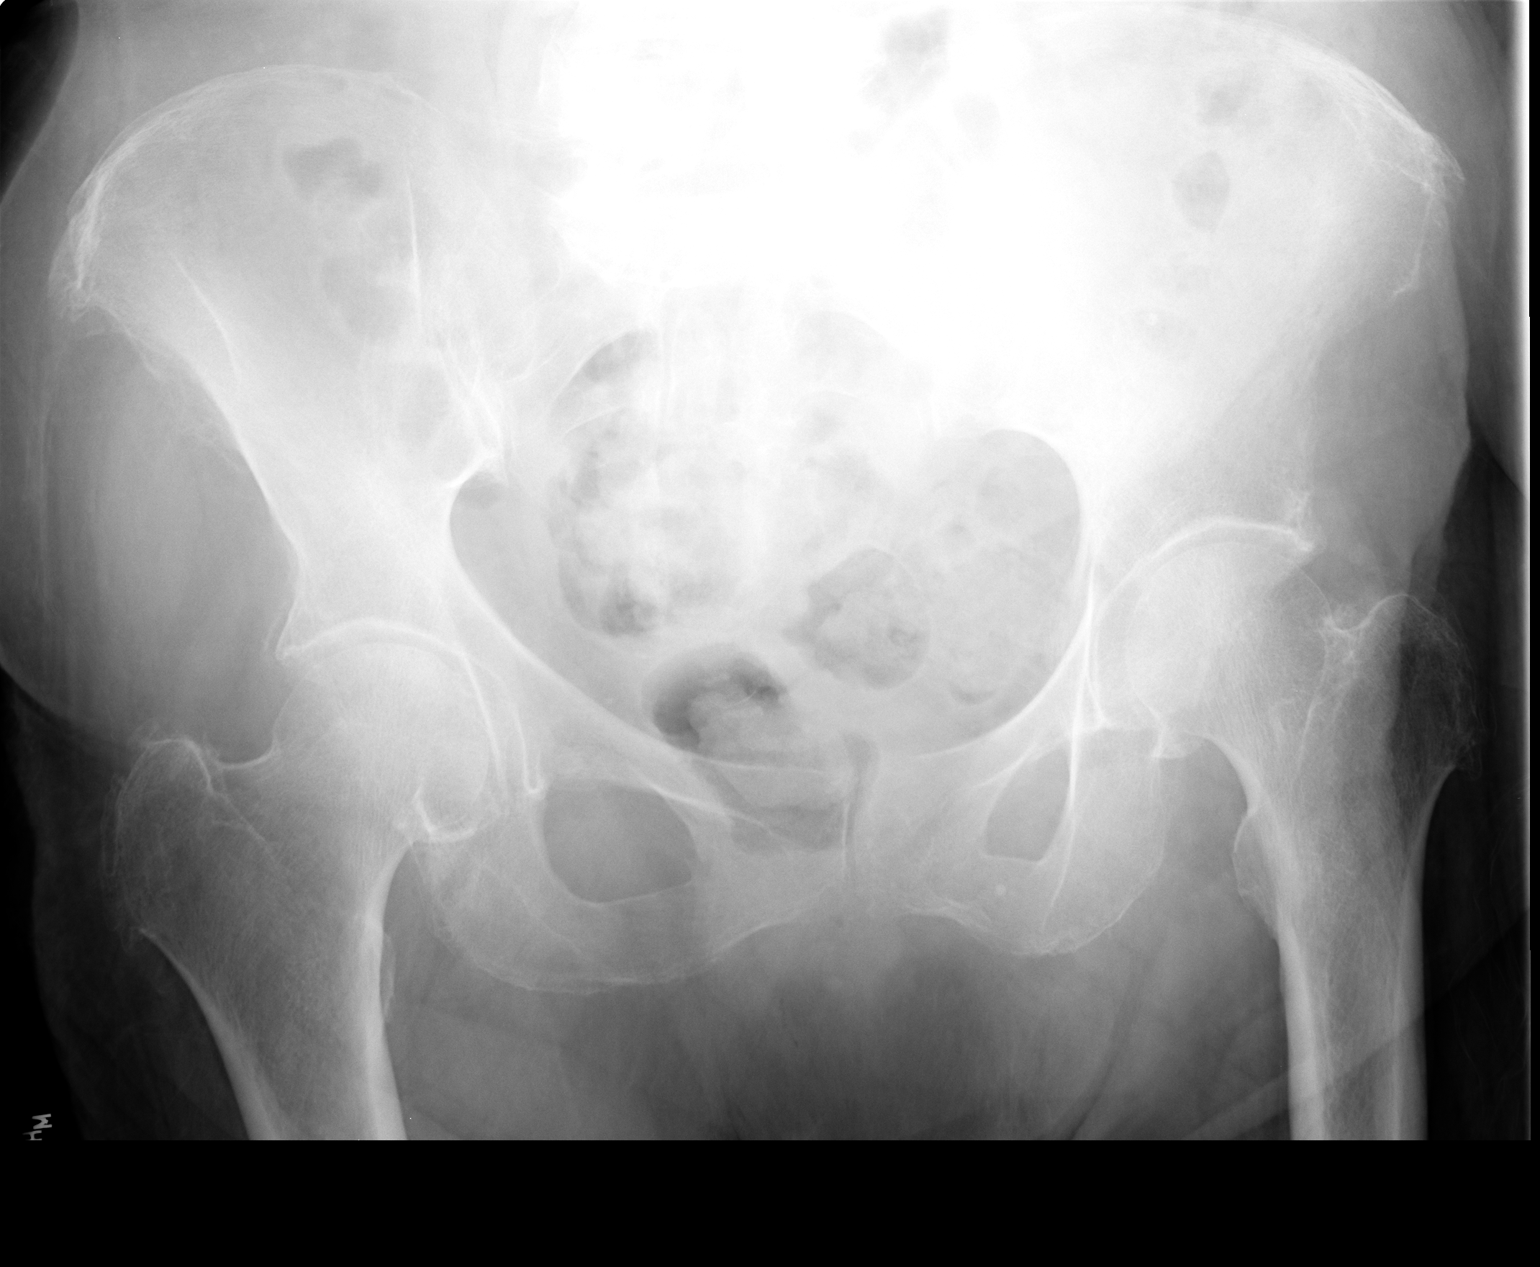

[2 of 2 positions shown; findings below may reference images not displayed]

FINDINGS: There is no evidence of pelvic fracture or diastasis. Mild pelvic
enthesopathy. Spondylitic changes in the lower lumbar spine. No
other pelvic bone lesions are seen.
IMPRESSION: Negative pelvis

## 2016-05-15 IMAGING — CR DG ELBOW COMPLETE 3+V*R*
4 series · 4 of 4 positions shown · non-contrast
Comparison: 10/04/2012

CLINICAL DATA: FALL

EXAM:
RIGHT ELBOW - COMPLETE 3+ VIEW

[view not recorded (1 of 4)]
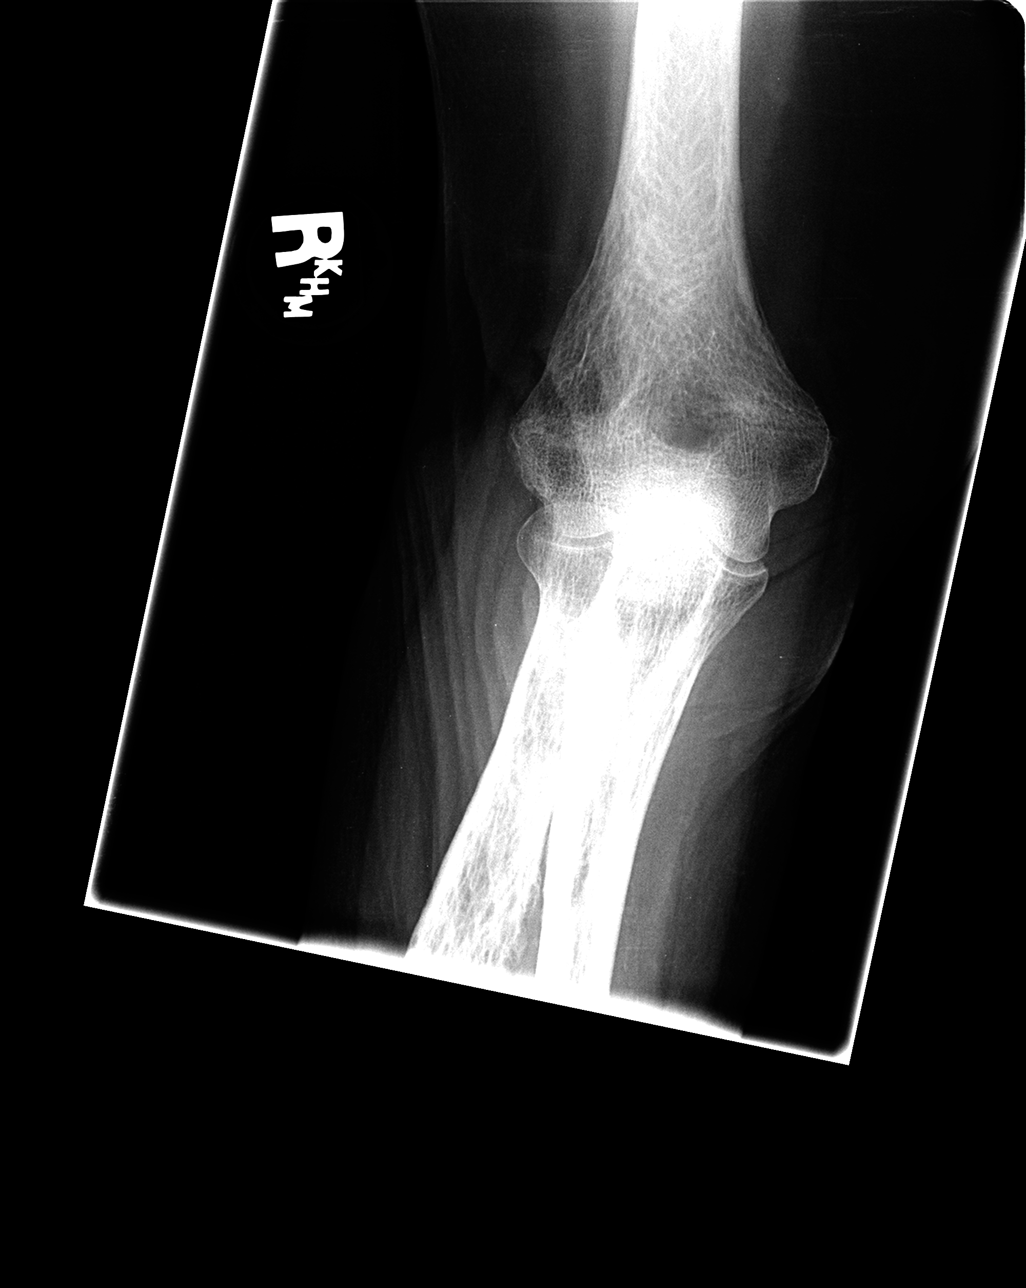

[view not recorded (2 of 4)]
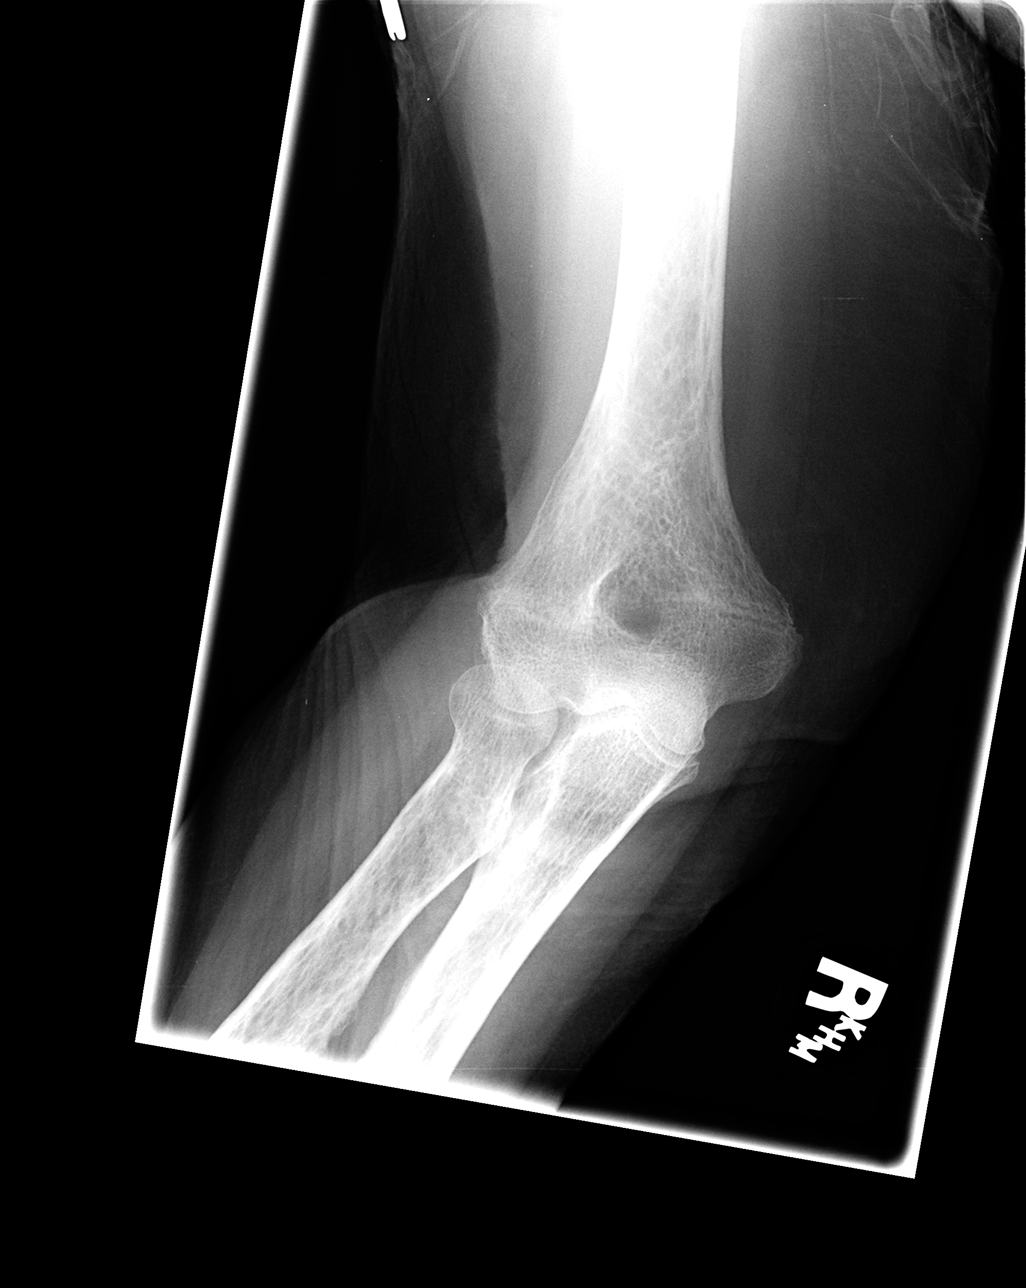

[view not recorded (3 of 4)]
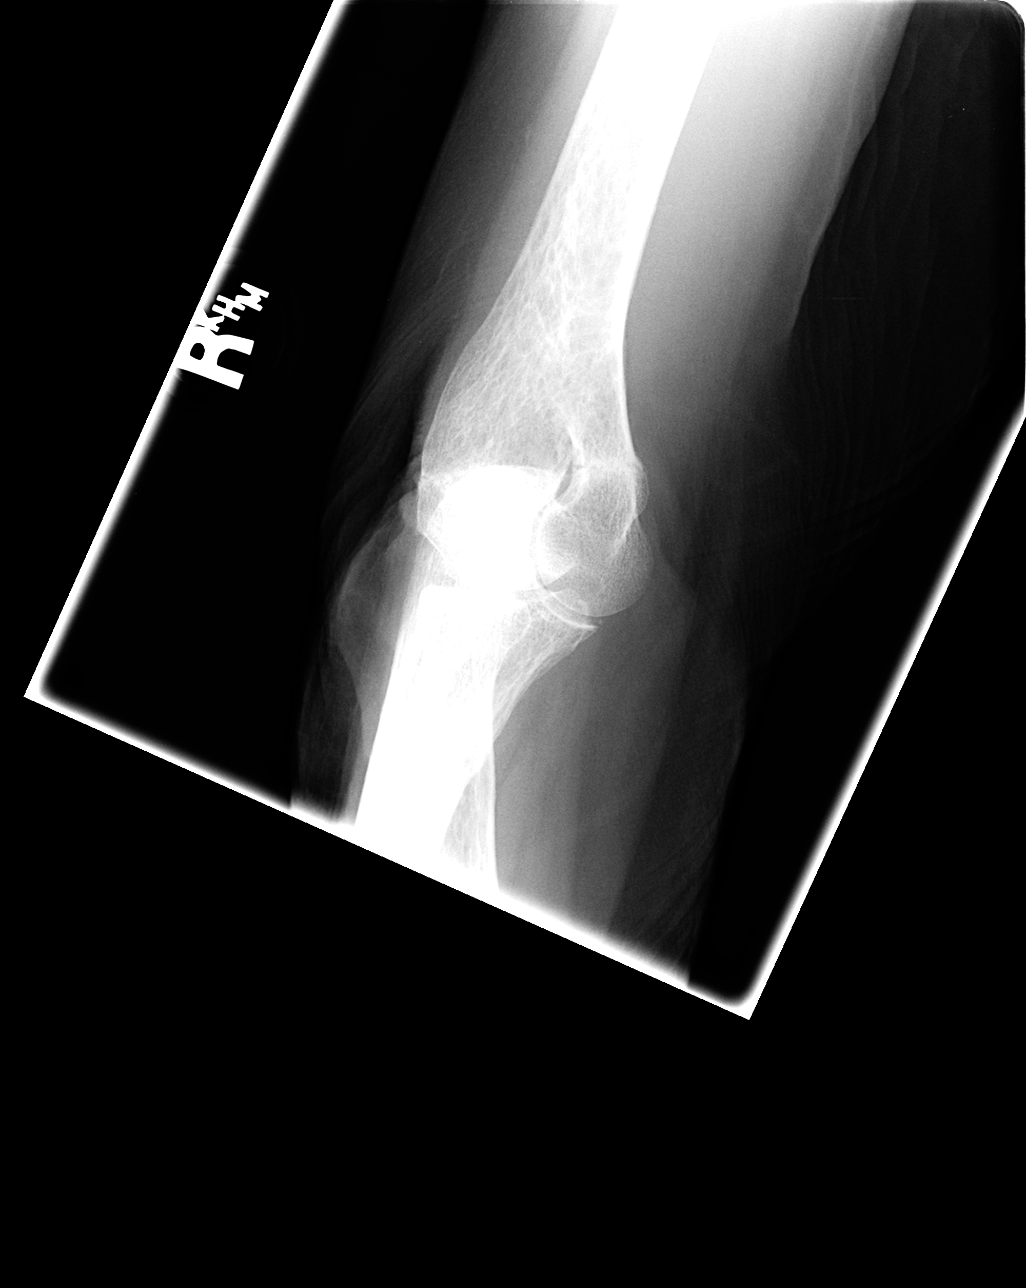

[view not recorded (4 of 4)]
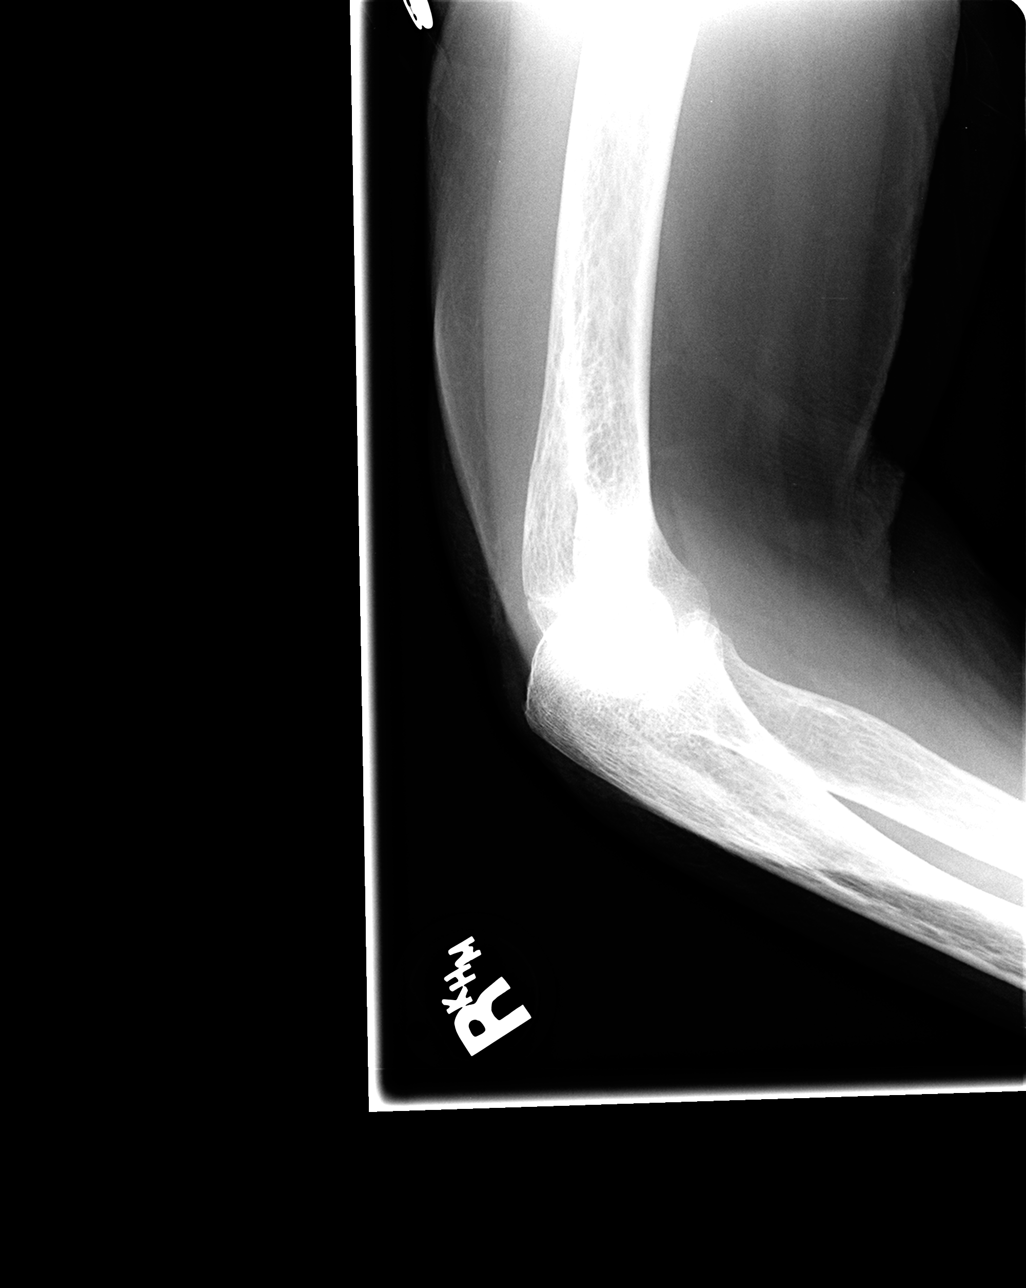

[4 of 4 positions shown; findings below may reference images not displayed]

FINDINGS: Transverse sclerosis and lucency along the posterior supracondylar
region of the humerus. No acute fracture. No effusion. Normal
alignment.
IMPRESSION: 1. Negative for acute fracture or dislocation.
2. Subacute or chronic supracondylar humerus fracture, nondisplaced,
suspicious for delayed union. Consider nonemergent follow-up MRI if
persistent pain.
Findings were reviewed with Dr. Ologo, who concurs.

## 2016-05-15 IMAGING — CR DG LUMBAR SPINE COMPLETE 4+V
5 series · 5 of 5 positions shown · non-contrast
Comparison: CT 09/03/2013

CLINICAL DATA: back pain, fall

EXAM:
LUMBAR SPINE - COMPLETE 4+ VIEW

[view not recorded (1 of 5)]
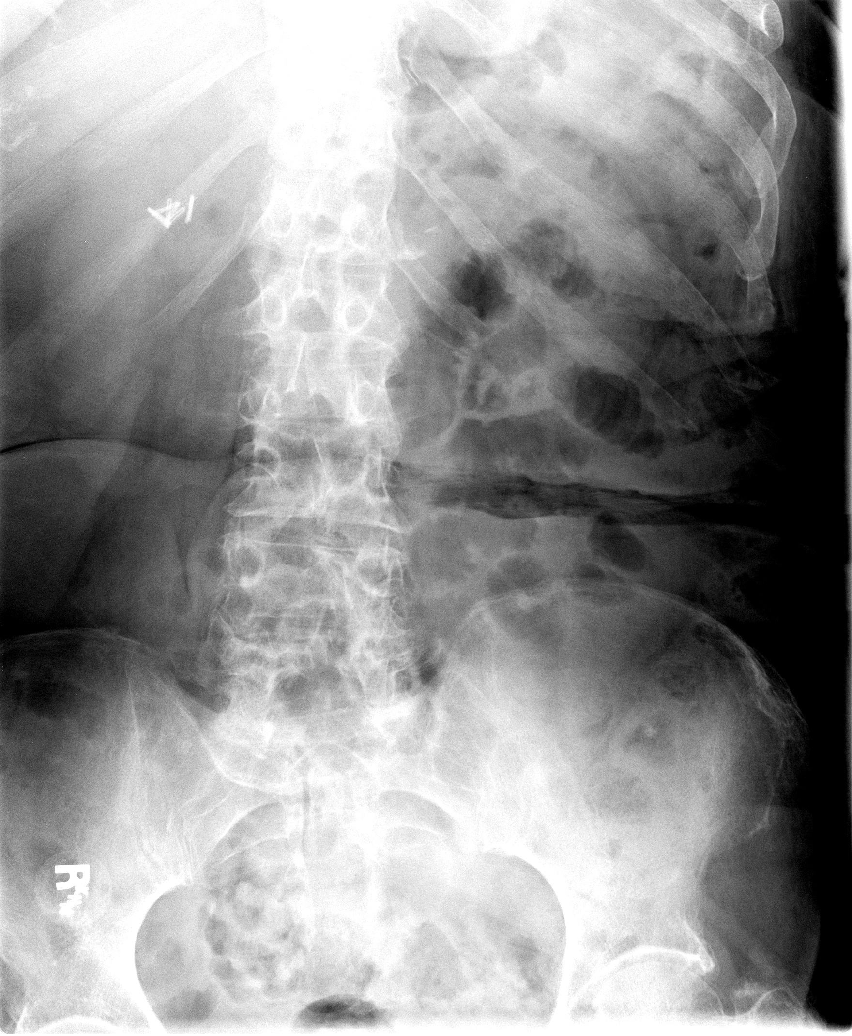

[view not recorded (2 of 5)]
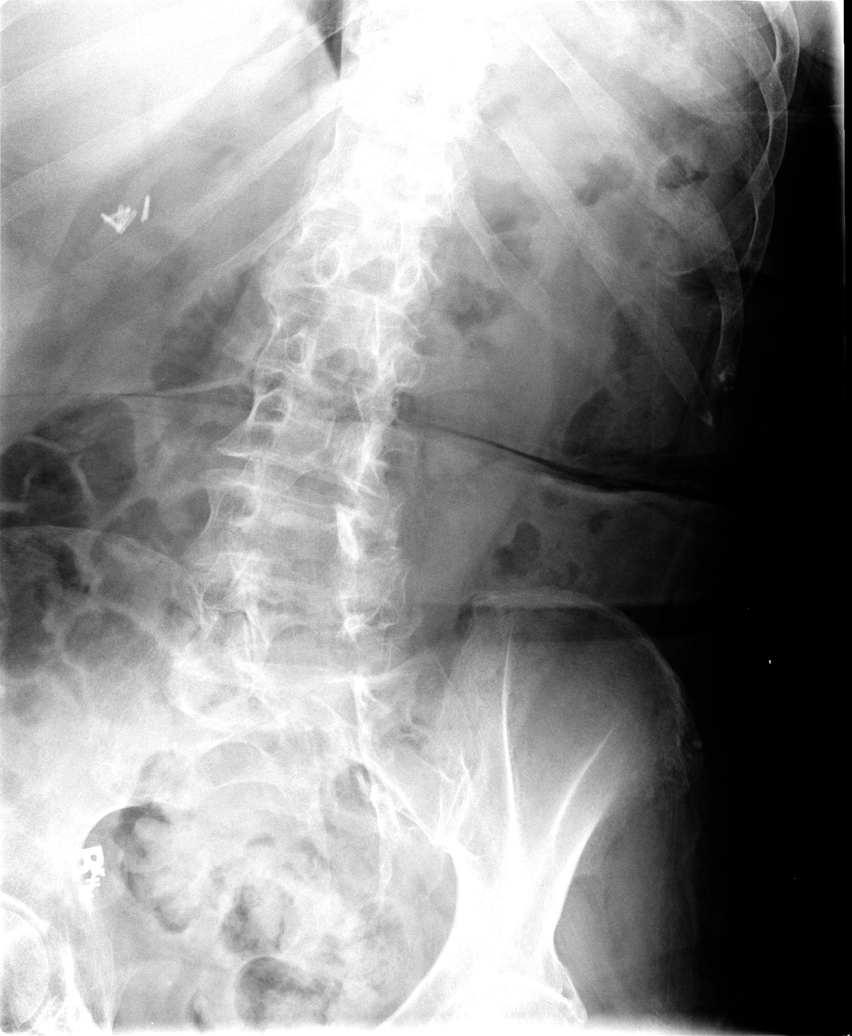

[view not recorded (3 of 5)]
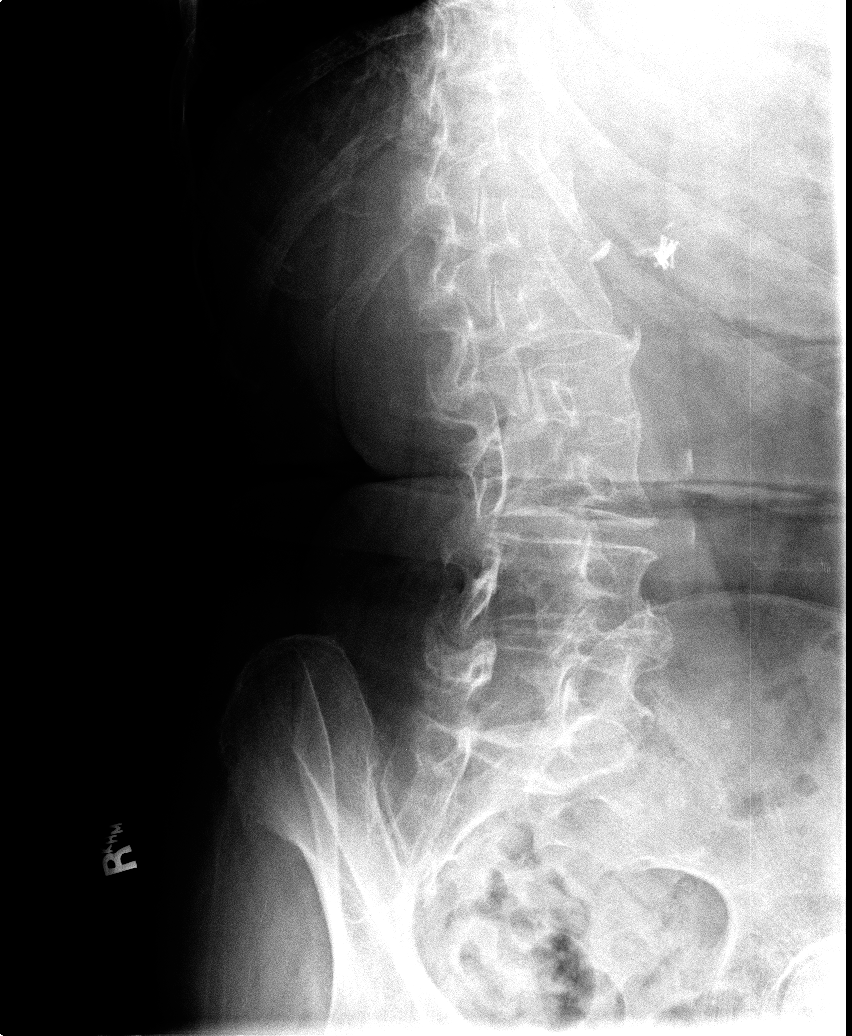

[view not recorded (4 of 5)]
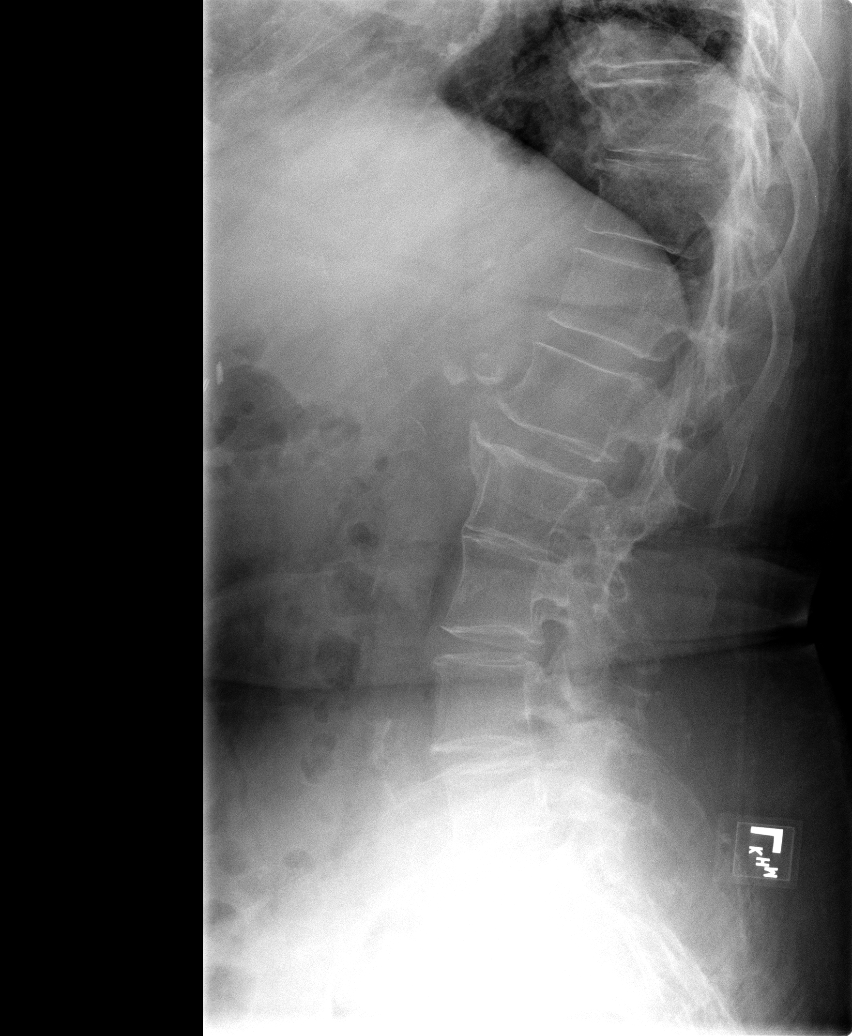

[view not recorded (5 of 5)]
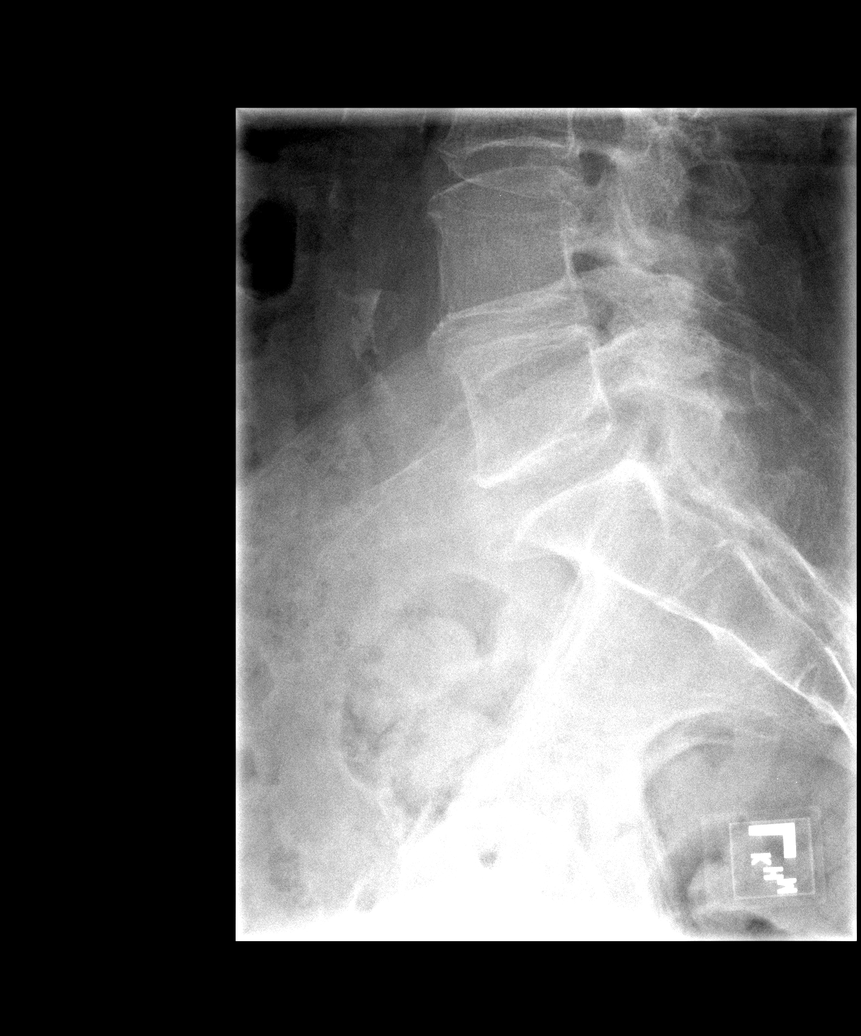

[5 of 5 positions shown; findings below may reference images not displayed]

FINDINGS: Narrowing of L2-3, L3-4, and L4-5 interspaces. Negative for
fracture. Normal alignment. Anterior spurring L1-2. Patchy aortic
calcifications without suggestion of aneurysm. Surgical clips right
upper abdomen.
IMPRESSION: 1. Negative for fracture or other acute bone abnormality.
2. Degenerative disc disease as above.

## 2016-05-18 ENCOUNTER — Other Ambulatory Visit: Payer: Self-pay | Admitting: Cardiology

## 2016-05-20 IMAGING — CR DG CHEST 1V PORT
1 series · 1 of 1 positions shown · non-contrast
Comparison: 12/21/2013

CLINICAL DATA: Shortness of breast.

EXAM:
PORTABLE CHEST - 1 VIEW

[portable]
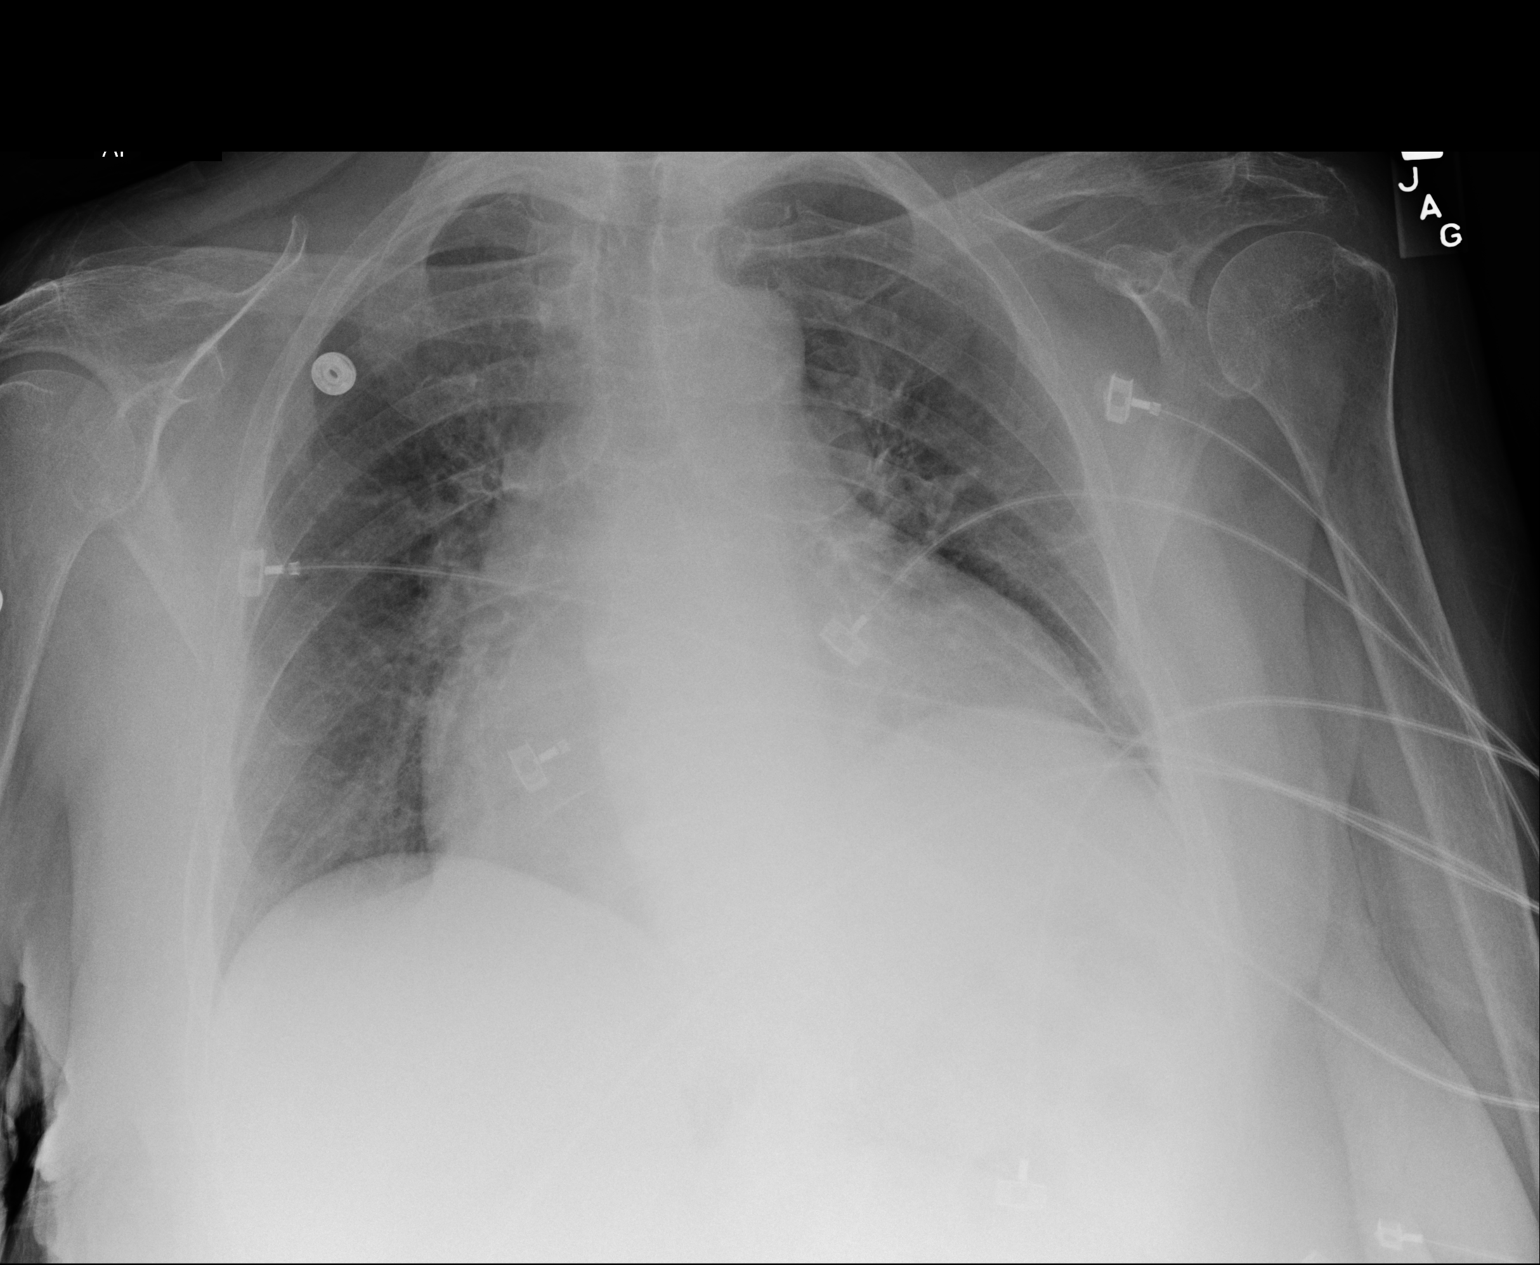

[1 of 1 positions shown; findings below may reference images not displayed]

FINDINGS: Cardiomegaly with vascular congestion. No overt edema. No confluent
opacities or effusions. No acute bony abnormality.
IMPRESSION: Cardiomegaly, vascular congestion.

## 2016-05-24 IMAGING — CR DG CHEST 2V
2 series · 2 of 2 positions shown · non-contrast
Comparison: 01/03/2014

CLINICAL DATA: Chest pain, hypertension

EXAM:
CHEST  2 VIEW

[view not recorded (1 of 2)]
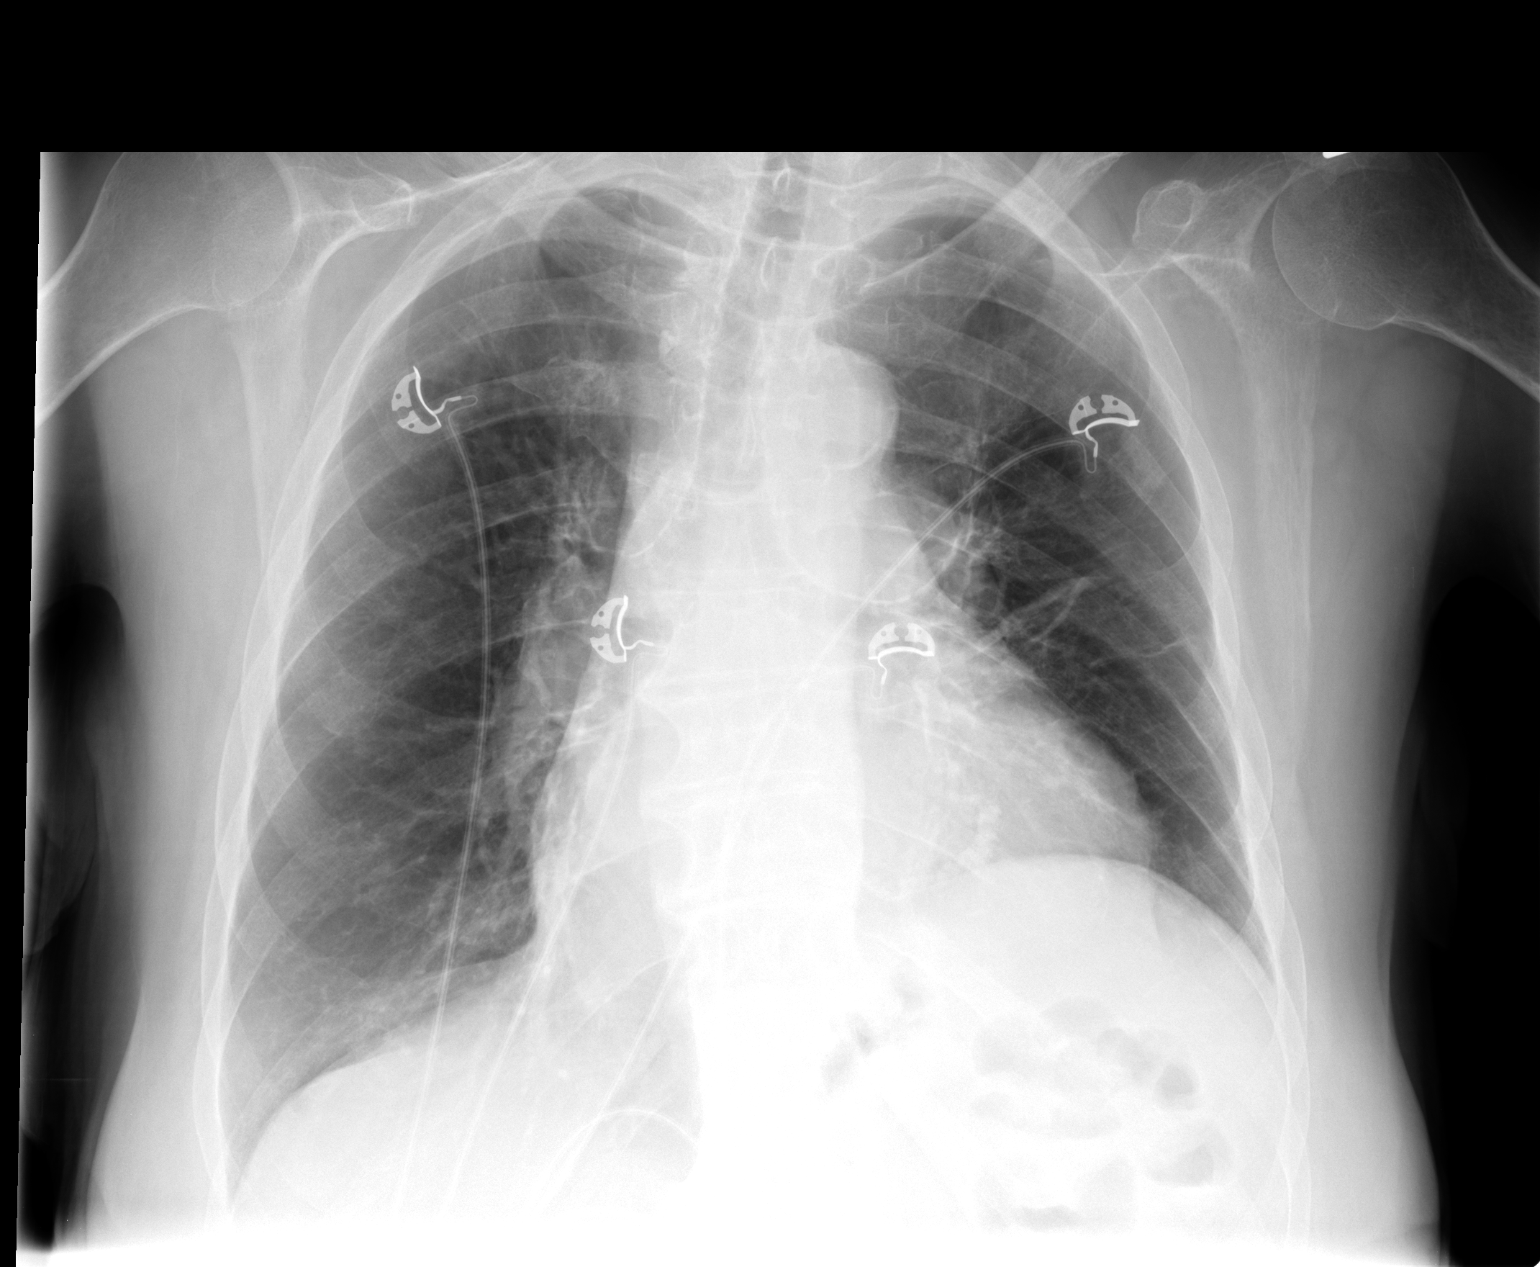

[view not recorded (2 of 2)]
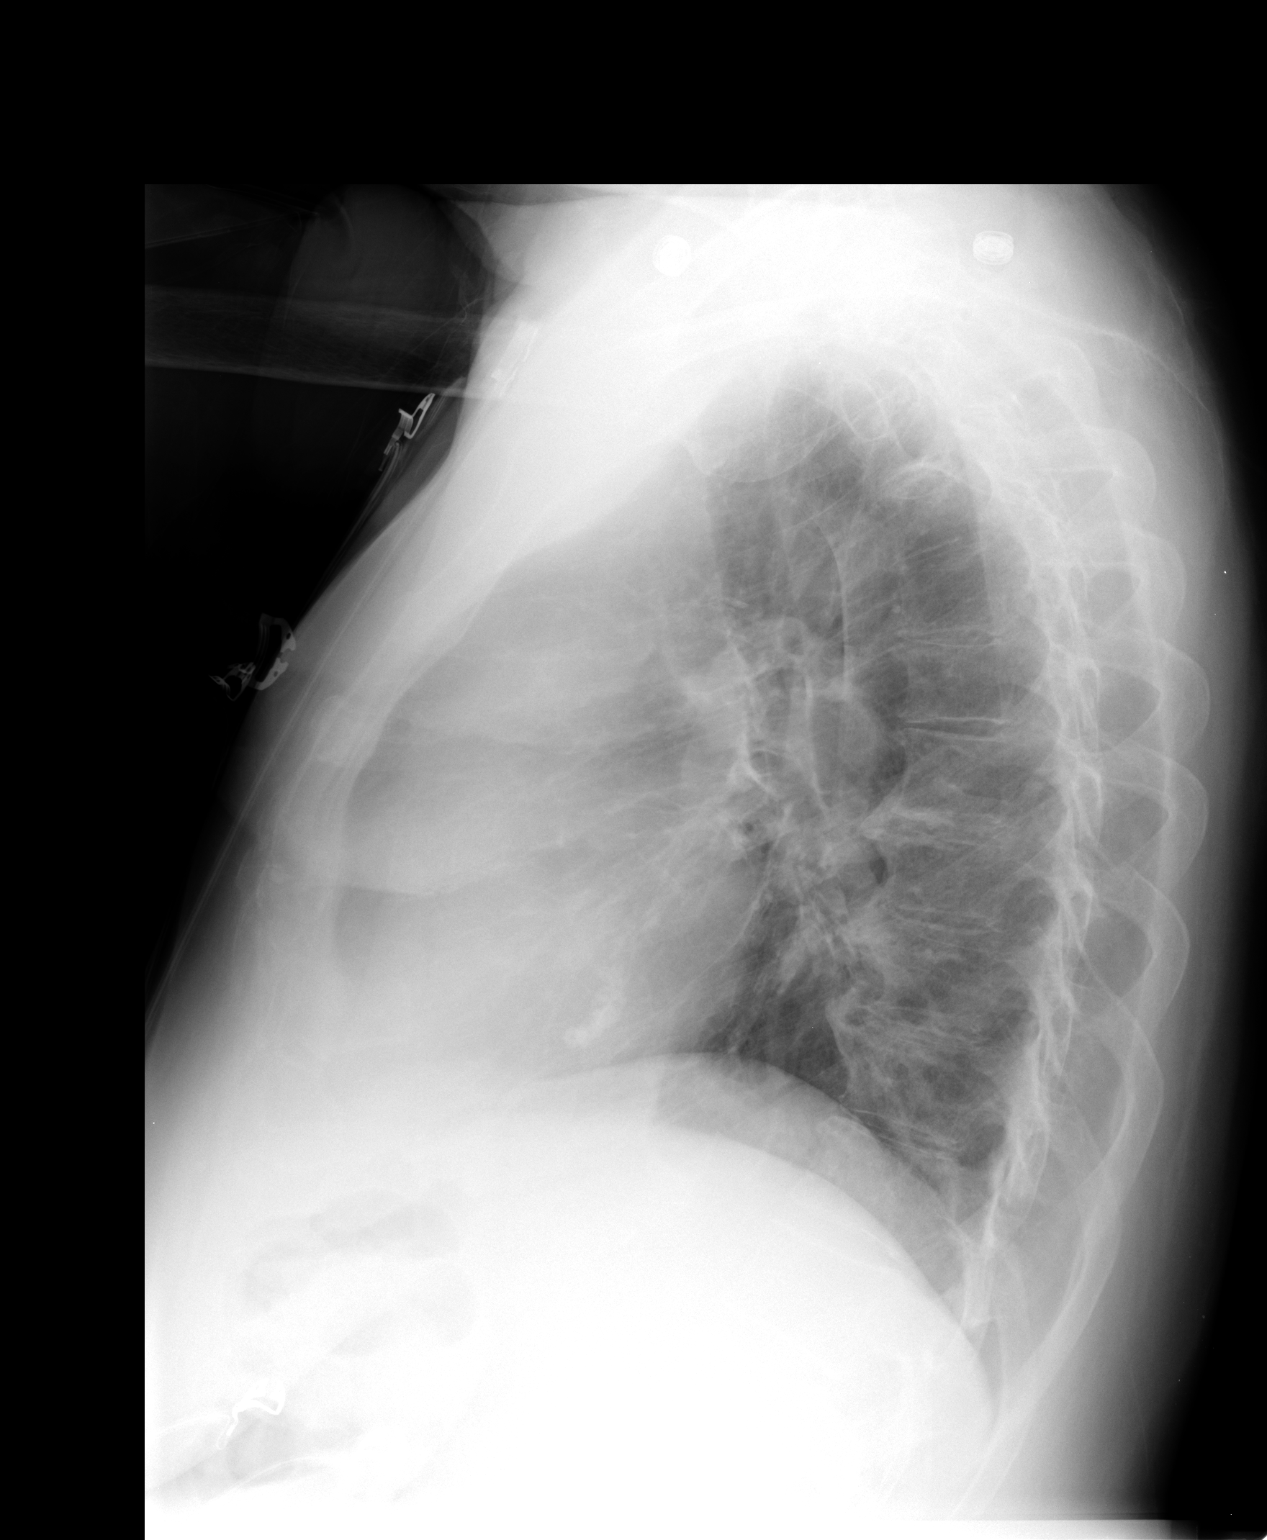

[2 of 2 positions shown; findings below may reference images not displayed]

FINDINGS: Cardiomegaly. Mitral valve annular calcifications are noted. Stable
elevation of the left hemidiaphragm. No confluent opacities or
effusions. No acute bony abnormality.
IMPRESSION: Cardiomegaly.  No active disease.

## 2016-06-03 NOTE — Patient Instructions (Signed)
[name]  06/03/2016     @PREFPERIOPPHARMACY @   Your procedure is scheduled on  06/09/2016   Report to [facility] at  1020 A.M.  Call this number if you have problems the morning of surgery:  [phone]   Remember:  Do not eat food or drink liquids after midnight.  Take these medicines the morning of surgery with A SIP OF WATER  Pacerone, bentyl, cymbalta, pepcid, neurontin, hydrocodone, keppra, levothyroxine, skelaxin, zofran, protonix. Take 1/2 of your usual night time dosage of insulin. DO NOT take any medications for diabetes the morning of surgery.   Do not wear jewelry, make-up or nail polish.  Do not wear lotions, powders, or perfumes, or deoderant.  Do not shave 48 hours prior to surgery.  Men may shave face and neck.  Do not bring valuables to the hospital.  [facility] is not responsible for any belongings or valuables.  Contacts, dentures or bridgework may not be worn into surgery.  Leave your suitcase in the car.  After surgery it may be brought to your room.  For patients admitted to the hospital, discharge time will be determined by your treatment team.  Patients discharged the day of surgery will not be allowed to drive home.   Name and phone number of your driver:   family Special instructions:  Last dose of eliquis should be on Dec. 4.  Please read over the following fact sheets that you were given. Anesthesia Post-op Instructions and Care and Recovery After Surgery      Knee Ligament Injury, Arthroscopy Arthroscopy is a surgical technique in which your health care provider examines your knee through a small, pencil-sized telescope (arthroscope). Often, repairs to injured ligaments can be done with instruments in the arthroscope. Arthroscopy is less invasive than open-knee surgery. Tell a health care provider about:  Any allergies you have.  All medicines you are taking, including vitamins, herbs, eye drops, creams, and over-the-counter  medicines.  Any problems you or family members have had with anesthetic medicines.  Any blood disorders you have.  Any surgeries you have had.  Any medical conditions you have. What are the risks? Generally, this is a safe procedure. However, as with any procedure, problems can occur. Possible problems include:  Infection.  Bleeding.  Stiffness. What happens before the procedure?  Ask your health care provider about changing or stopping any regular medicines. Avoid taking aspirin or blood thinners as directed by your health care provider.  Do not eat or drink anything after midnight the night before surgery.  If you smoke, do not smoke for at least 2 weeks before your surgery.  Do not drink alcohol starting the day before your surgery.  Let your health care provider know if you develop a cold or any infection before your surgery.  Arrange for someone to drive you home after the surgery or after your hospital stay. Also arrange for someone to help you with activities during recovery. What happens during the procedure?  Small monitors will be put on your body. They are used to check your heart, blood pressure, and oxygen levels.  An IV access tube will be put into one of your veins. Medicine will be able to flow directly into your body through this IV tube.  You might be given a medicine to help you relax (sedative).  You will be given a medicine that makes you go to sleep (general anesthetic), and a breathing tube  will be placed into your lungs during the procedure.  Several small incisions are made in your knee. Saline fluid is placed into one of the incisions to expand the knee and clear away any blood in the knee.  Your health care provider will insert the arthroscope to examine the injured knee.  During arthroscopy, your health care provider may find a partial or complete tear in a ligament.  Tools can be inserted through the other incisions to repair the injured  ligaments.  The incisions are then closed with absorbable stitches and covered with dressings. What happens after the procedure?  You will be taken to the recovery area where you will be monitored.  When you are awake, stable, and taking fluids without problems, you will be allowed to go home. This information is not intended to replace advice given to you by your health care provider. Make sure you discuss any questions you have with your health care provider. Document Released: 06/17/2000 Document Revised: 11/26/2015 Document Reviewed: 01/30/2013 Elsevier Interactive Patient Education  2017 [employer] Knee Ligament Injury, Arthroscopy, Care After Refer to this sheet in the next few weeks. These instructions provide you with information on caring for yourself after your procedure. Your health care provider may also give you more specific instructions. Your treatment has been planned according to current medical practices, but problems sometimes occur. Call your health care provider if you have any problems or questions after your procedure. What can I expect after the procedure? After your procedure, it is typical to have the following:  Pain and swelling in your knee.  Your ankle and calf may be swollen and bruised for 3-4 days.  You will be tired during your recovery and will need to rest during the day. You will return to your normal level of activity over the next month following surgery.  You may be constipated. Follow these instructions at home: Ligaments take a long time to heal. For the first several weeks, you may be instructed to limit the amount of weight you put on your leg. A removable knee immobilizer or hinged splint may be used to support your knee. You can use crutches to help you get up and move around. Recovery exercises can help you regain strength and range of motion in your knee. Increased muscle strength helps support the knee. These exercises allow a faster return  to normal activities. Consult your health care provider before performing any exercise. The following suggestions may help to relieve discomfort at home after your procedure:  Apply ice to the injured area:  Put ice in a plastic bag.  Place a towel between your skin and the bag.  Leave the ice on for 20 minutes, 2-3 times a day.  To help relieve constipation, drink 6-8 glasses of water a day and eat plenty of fruits and vegetables. You may be given a stool softener to prevent constipation.  You will be given medicine to control pain. Only take over-the-counter or prescription medicines for pain, discomfort, or fever as directed by your health care provider. Contact a health care provider if:  You have increased bleeding (more than a small spot) from your incision site.  You notice redness, swelling, or increasing pain at your incision site.  You notice pus coming from your incision.  You notice a foul smell coming from your incision or dressing.  You develop increasing stiffness or pain in your knee. Get help right away if:  You have a fever.  You develop  a rash, difficulty breathing, or any allergic problems. This information is not intended to replace advice given to you by your health care provider. Make sure you discuss any questions you have with your health care provider. Document Released: 04/10/2013 Document Revised: 11/26/2015 Document Reviewed: 01/30/2013 Elsevier Interactive Patient Education  2017 [employer]. PATIENT INSTRUCTIONS POST-ANESTHESIA  IMMEDIATELY FOLLOWING SURGERY:  Do not drive or operate machinery for the first twenty four hours after surgery.  Do not make any important decisions for twenty four hours after surgery or while taking narcotic pain medications or sedatives.  If you develop intractable nausea and vomiting or a severe headache please notify your doctor immediately.  FOLLOW-UP:  Please make an appointment with your surgeon as instructed. You  do not need to follow up with anesthesia unless specifically instructed to do so.  WOUND CARE INSTRUCTIONS (if applicable):  Keep a dry clean dressing on the anesthesia/puncture wound site if there is drainage.  Once the wound has quit draining you may leave it open to air.  Generally you should leave the bandage intact for twenty four hours unless there is drainage.  If the epidural site drains for more than 36-48 hours please call the anesthesia department.  QUESTIONS?:  Please feel free to call your physician or the hospital operator if you have any questions, and they will be happy to assist you.

## 2016-06-07 ENCOUNTER — Encounter (HOSPITAL_COMMUNITY)
Admission: RE | Admit: 2016-06-07 | Discharge: 2016-06-07 | Disposition: A | Discharge status: Home / Self Care | Payer: Medicare Other | Source: Ambulatory Visit | Attending: Orthopedic Surgery | Admitting: Orthopedic Surgery

## 2016-06-07 ENCOUNTER — Encounter (HOSPITAL_COMMUNITY): Payer: Self-pay

## 2016-06-07 DIAGNOSIS — M659 Synovitis and tenosynovitis, unspecified: Secondary | ICD-10-CM | POA: Diagnosis not present

## 2016-06-07 DIAGNOSIS — K219 Gastro-esophageal reflux disease without esophagitis: Secondary | ICD-10-CM | POA: Diagnosis not present

## 2016-06-07 DIAGNOSIS — I110 Hypertensive heart disease with heart failure: Secondary | ICD-10-CM | POA: Diagnosis not present

## 2016-06-07 DIAGNOSIS — X58XXXA Exposure to other specified factors, initial encounter: Secondary | ICD-10-CM | POA: Diagnosis not present

## 2016-06-07 DIAGNOSIS — M1711 Unilateral primary osteoarthritis, right knee: Secondary | ICD-10-CM | POA: Diagnosis not present

## 2016-06-07 DIAGNOSIS — F419 Anxiety disorder, unspecified: Secondary | ICD-10-CM | POA: Diagnosis not present

## 2016-06-07 DIAGNOSIS — K3184 Gastroparesis: Secondary | ICD-10-CM | POA: Diagnosis not present

## 2016-06-07 DIAGNOSIS — E1143 Type 2 diabetes mellitus with diabetic autonomic (poly)neuropathy: Secondary | ICD-10-CM | POA: Diagnosis not present

## 2016-06-07 DIAGNOSIS — I5032 Chronic diastolic (congestive) heart failure: Secondary | ICD-10-CM | POA: Diagnosis not present

## 2016-06-07 DIAGNOSIS — Z794 Long term (current) use of insulin: Secondary | ICD-10-CM | POA: Diagnosis not present

## 2016-06-07 DIAGNOSIS — Z79899 Other long term (current) drug therapy: Secondary | ICD-10-CM | POA: Diagnosis not present

## 2016-06-07 DIAGNOSIS — S83241A Other tear of medial meniscus, current injury, right knee, initial encounter: Secondary | ICD-10-CM | POA: Diagnosis not present

## 2016-06-07 DIAGNOSIS — K589 Irritable bowel syndrome without diarrhea: Secondary | ICD-10-CM | POA: Diagnosis not present

## 2016-06-07 DIAGNOSIS — Z9989 Dependence on other enabling machines and devices: Secondary | ICD-10-CM | POA: Diagnosis not present

## 2016-06-07 DIAGNOSIS — D649 Anemia, unspecified: Secondary | ICD-10-CM | POA: Diagnosis not present

## 2016-06-07 DIAGNOSIS — M25561 Pain in right knee: Secondary | ICD-10-CM | POA: Diagnosis present

## 2016-06-07 DIAGNOSIS — I480 Paroxysmal atrial fibrillation: Secondary | ICD-10-CM | POA: Diagnosis not present

## 2016-06-07 DIAGNOSIS — R569 Unspecified convulsions: Secondary | ICD-10-CM | POA: Diagnosis not present

## 2016-06-07 DIAGNOSIS — E039 Hypothyroidism, unspecified: Secondary | ICD-10-CM | POA: Diagnosis not present

## 2016-06-07 DIAGNOSIS — S83281A Other tear of lateral meniscus, current injury, right knee, initial encounter: Secondary | ICD-10-CM | POA: Diagnosis not present

## 2016-06-07 DIAGNOSIS — I272 Pulmonary hypertension, unspecified: Secondary | ICD-10-CM | POA: Diagnosis not present

## 2016-06-07 DIAGNOSIS — Z87891 Personal history of nicotine dependence: Secondary | ICD-10-CM | POA: Diagnosis not present

## 2016-06-07 DIAGNOSIS — G4733 Obstructive sleep apnea (adult) (pediatric): Secondary | ICD-10-CM | POA: Diagnosis not present

## 2016-06-07 HISTORY — DX: Unspecified osteoarthritis, unspecified site: M19.90

## 2016-06-07 HISTORY — DX: Anxiety disorder, unspecified: F41.9

## 2016-06-07 HISTORY — DX: Cardiac arrhythmia, unspecified: I49.9

## 2016-06-07 LAB — BASIC METABOLIC PANEL
ANION GAP: 6 (ref 5–15)
BUN: 13 mg/dL (ref 6–20)
CO2: 26 mmol/L (ref 22–32)
Calcium: 8.9 mg/dL (ref 8.9–10.3)
Chloride: 106 mmol/L (ref 101–111)
Creatinine, Ser: 0.99 mg/dL (ref 0.44–1.00)
GFR calc Af Amer: >60 mL/min (ref >60)
GFR, EST NON AFRICAN AMERICAN: 58 mL/min — AB (ref >60)
GLUCOSE: 157 mg/dL — AB (ref 65–99)
POTASSIUM: 4.4 mmol/L (ref 3.5–5.1)
Sodium: 138 mmol/L (ref 135–145)

## 2016-06-07 LAB — CBC WITH DIFFERENTIAL/PLATELET
BASOS ABS: 0.1 10*3/uL (ref 0.0–0.1)
Basophils Relative: 1 %
EOS PCT: 3 %
Eosinophils Absolute: 0.3 10*3/uL (ref 0.0–0.7)
HEMATOCRIT: 38.2 % (ref 36.0–46.0)
HEMOGLOBIN: 12 g/dL (ref 12.0–15.0)
LYMPHS PCT: 34 %
Lymphs Abs: 2.8 10*3/uL (ref 0.7–4.0)
MCH: 32.2 pg (ref 26.0–34.0)
MCHC: 31.4 g/dL (ref 30.0–36.0)
MCV: 102.4 fL — AB (ref 78.0–100.0)
Monocytes Absolute: 0.8 10*3/uL (ref 0.1–1.0)
Monocytes Relative: 10 %
NEUTROS ABS: 4.4 10*3/uL (ref 1.7–7.7)
NEUTROS PCT: 52 %
PLATELETS: 211 10*3/uL (ref 150–400)
RBC: 3.73 MIL/uL — AB (ref 3.87–5.11)
RDW: 14.2 % (ref 11.5–15.5)
WBC: 8.3 10*3/uL (ref 4.0–10.5)

## 2016-06-08 NOTE — H&P (Signed)
History for admission with physical   Chief Complaint  Patient presents with  . Follow-up      RIGHT KNEE, DISCUSS SURGERY      HPI [name] is a 65 y.o. female.  PRESENTS FOR EVAL RIGHT KNEE FOR SURGERY \ THE HISTORY BY DR Hilda Lias WAS CONFIRMED  HPI She had a fall last June, 2016 and hurt her right proximal fibula with a nondisplaced fracture.  Since then she has had right knee pain with swelling, popping.  More recently she has had giving way of the right knee and more swelling.  She feels it might buckle at anytime. She uses a cane now to catch herself from falling.  She has no redness.  She has tried ice, heat, rest, cane and Advil with little help.  She has no numbness.     Review of Systems Review of Systems (PER DR KEELING'S NOTE ) Review of Systems  HENT: Negative for congestion.   Respiratory: Negative for cough and shortness of breath.   Cardiovascular: Negative for chest pain and leg swelling.  Endocrine: Positive for cold intolerance.  Musculoskeletal: Positive for arthralgias, gait problem and joint swelling.  Allergic/Immunologic: Positive for environmental allergies.          Past Medical History:  Diagnosis Date  . Anemia      H/H of 10/30 with a normal MCV in 12/09  . Barrett's esophagus      Diagnosed 1995. Last EGD 2016-NO BARRETT'S.   . Chest pain      Negative cardiac catheterization in 2002; negative stress nuclear study in 2008  . Chronic anticoagulation    . Chronic diastolic CHF (congestive heart failure) (HCC)      a. EF predominantly normal during prior echoes but was 40% during 10/2014 echo. b. Most recent 01/2015 EF normal, 55-60%.  . Chronic LBP      Surgical intervention in 1996  . Diabetes mellitus, type 2 (HCC)      Insulin therapy; exacerbated by prednisone  . Gastroparesis      99% retention 05/2008 on GES  . GERD (gastroesophageal reflux disease)    . Hiatal hernia    . Hyperlipidemia    . Hypertension    . Hypothyroid    .  IBS (irritable bowel syndrome)    . Obesity    . OSA on CPAP    . Paroxysmal atrial fibrillation (HCC)    . Pulmonary hypertension 01/2015    a. Predominantly pulmonary venous hypertension but may be component of PAH.  . Seizures (HCC)    . Syncope      a. Admitted 05/2009; magnetic resonance imagin/ MRA - negative; etiology thought to be orthostasis secondary to drugs and dehydration. b. Syncope 02/2015 also felt 2/2 dehydration.           Past Surgical History:  Procedure Laterality Date  . BACK SURGERY   1996  . BIOPSY N/A 11/08/2013    Procedure: BIOPSY  / Tissue sampling / ulcers present in small intestine;  Surgeon: West Bali, MD;  Location: [facility];  Service: Endoscopy;  Laterality: N/A;  . CARDIAC CATHETERIZATION   2002  . CARDIAC CATHETERIZATION N/A 01/26/2015    Procedure: Right Heart Cath;  Surgeon: Laurey Morale, MD;  Location: [facility] INVASIVE CV LAB;  Service: Cardiovascular;  Laterality: N/A;  . CARDIOVASCULAR STRESS TEST   2008    Stress nuclear study  . CARDIOVERSION N/A 03/06/2015    Procedure: CARDIOVERSION;  Surgeon:  Laurey Morale, MD;  Location: [facility] ENDOSCOPY;  Service: Cardiovascular;  Laterality: N/A;  . CARPAL TUNNEL RELEASE   1994  . COLONOSCOPY   11/2011    Dr. Darrick Penna: Internal hemorrhoids, mild diverticulosis. Random colon biopsies negative.  . COLONOSCOPY N/A 11/08/2013    SLF: Normal mucosa in the terminal ileum/The colon IS redundant/  Moderate diverticulosis throughout the entire colon. ileum bx benign. colon bx benign  . ESOPHAGOGASTRODUODENOSCOPY   2008    Barrett's without dysplasia. esphagus dilated. antral erosions, h.pylori serologies negative.  . ESOPHAGOGASTRODUODENOSCOPY   11/2011    Dr. Darrick Penna: Barrett's esophagus, mild gastritis, diverticulum in the second portion of the duodenum repeat EGD 3 years. Small bowel biopsies negative. Gastric biopsy show reactive gastropathy but no H. pylori. Esophageal biopsies consistent with GERD. Next EGD 11/2014   . ESOPHAGOGASTRODUODENOSCOPY N/A 11/21/2014    ZOX:WRUE non-erosive gastritis/irregular z-line  . GIVENS CAPSULE STUDY   12/07/2011    Proximal small bowel, rare AVM. Distal small bowel, multiple ulcers noted  . GIVENS CAPSULE STUDY N/A 09/27/2013    Distal small bowel ulcers extending to TI.  Marland Kitchen GIVENS CAPSULE STUDY N/A 10/10/2013    Procedure: GIVENS CAPSULE STUDY;  Surgeon: West Bali, MD;  Location: [facility] SU[facility];  Service: Endoscopy;  Laterality: N/A;  7:30  . LAMINECTOMY   1995    L4-L5  . LAPAROSCOPIC CHOLECYSTECTOMY   1990s  . LEFT HEART CATHETERIZATION WITH CORONARY ANGIOGRAM   01/10/2014    Procedure: LEFT HEART CATHETERIZATION WITH CORONARY ANGIOGRAM;  Surgeon: Lesleigh Noe, MD;  Location: [facility] CATH LAB;  Service: Cardiovascular;;  . RIGHT HEART CATHETERIZATION N/A 01/10/2014    Procedure: RIGHT HEART CATH;  Surgeon: Lesleigh Noe, MD;  Location: [facility] CATH LAB;  Service: Cardiovascular;  Laterality: N/A;  . TOTAL ABDOMINAL HYSTERECTOMY   1999      Social History       Social History  Substance Use Topics  . Smoking status: Former Smoker      Packs/day: 0.25      Years: 15.00      Types: Cigarettes      Start date: 02/26/1966      Quit date: 07/01/1983  . Smokeless tobacco: Never Used        Comment: quit in 1984  . Alcohol use No        Comment: last etoh one year ago, never frequent           Allergies  Allergen Reactions  . Ibuprofen Other (See Comments)      Upsets her stomach and causes abdominal pain  . Codeine Nausea And Vomiting and Other (See Comments)      HALLUCINATIONS  . Dilaudid [Hydromorphone Hcl] Other (See Comments)      Made her pass out  . Celexa [Citalopram Hydrobromide] Other (See Comments)      Dyskinesia  . Reglan [Metoclopramide] Other (See Comments)      DYSKINESIA      Active Medications      Current Meds  Medication Sig  . acetaminophen (TYLENOL) 500 MG tablet Take 500 mg by mouth every 6 (six) hours as needed for moderate  pain or headache.  Marland Kitchen amiodarone (PACERONE) 200 MG tablet TAKE 1 TABLET BY MOUTH EVERY DAY  . apixaban (ELIQUIS) 5 MG TABS tablet Take 5 mg by mouth 2 (two) times daily.  Marland Kitchen dicyclomine (BENTYL) 10 MG capsule 1-2 capsules before meals as needed for abdominal cramping and loose stool  .  dronabinol (MARINOL) 2.5 MG capsule Take 2.5 mg by mouth 2 (two) times daily before a meal.  . DULoxetine (CYMBALTA) 30 MG capsule Take 30 mg by mouth 2 (two) times daily.  . famotidine (PEPCID) 20 MG tablet Take 20 mg by mouth at bedtime.   . furosemide (LASIX) 40 MG tablet Take 1 tablet (40 mg total) by mouth daily. (Patient taking differently: Take 20 mg by mouth daily. )  . gabapentin (NEURONTIN) 300 MG capsule Take 300 mg by mouth 3 (three) times daily.  Marland Kitchen. glipiZIDE (GLUCOTROL) 10 MG tablet Take 10 mg by mouth 2 (two) times daily before a meal.   . HYDROcodone-acetaminophen (NORCO/VICODIN) 5-325 MG tablet Take 1 tablet by mouth every 6 (six) hours as needed for moderate pain.  . Insulin Glargine (LANTUS SOLOSTAR) 100 UNIT/ML Solostar Pen Inject 20-50 Units into the skin at bedtime as needed. Patient takes 20 units every night at bedtime if over 178.  Takes 50 units if her blood sugar is above 300.  . Iron Polysacch Cmplx-B12-FA 150-0.025-1 MG CAPS TAKE 1 TABLET DAILY  . levETIRAcetam (KEPPRA) 750 MG tablet Take 750 mg by mouth 2 (two) times daily.  Marland Kitchen. levothyroxine (SYNTHROID, LEVOTHROID) 137 MCG tablet Take 137 mcg by mouth daily.  Marland Kitchen. loperamide (IMODIUM) 2 MG capsule Take 1 capsule (2 mg total) by mouth as needed for diarrhea or loose stools.  Marland Kitchen. LORazepam (ATIVAN) 1 MG tablet Take 1 mg by mouth 3 (three) times daily as needed for anxiety or sleep. For anxiety  . magnesium oxide (MAG-OX) 400 MG tablet Take 400 mg by mouth daily.   . metaxalone (SKELAXIN) 800 MG tablet Take 800 mg by mouth 3 (three) times daily.   . metFORMIN (GLUCOPHAGE) 500 MG tablet Take 1,000 mg by mouth 2 (two) times daily with a meal.   .  Multiple Vitamin (MULTIVITAMIN WITH MINERALS) TABS tablet Take 1 tablet by mouth daily.  . nitroGLYCERIN (NITROSTAT) 0.4 MG SL tablet Place 0.4 mg under the tongue every 5 (five) minutes as needed for chest pain. Reported on 07/08/2015  . ondansetron (ZOFRAN) 4 MG tablet Take 1 tablet (4 mg total) by mouth 4 (four) times daily -  before meals and at bedtime. As needed for nausea.  . pantoprazole (PROTONIX) 40 MG tablet Take 1 tablet (40 mg total) by mouth 2 (two) times daily before a meal.  . potassium chloride (K-DUR) 10 MEQ tablet TAKE 2 TABLETS BY MOUTH TWICE DAILY FOR LOW POTASSIUM  . vitamin B-12 (CYANOCOBALAMIN) 500 MCG tablet Take 500 mcg by mouth daily.            Physical Exam Physical Exam BP 125/70   Pulse (!) 59   Ht 5\' 7"  (1.702 m)   Wt 242 lb (109.8 kg)   BMI 37.90 kg/m    Gen. appearance. The patient is well-developed and well-nourished, grooming and hygiene are normal. There are no gross congenital abnormalities   The patient is alert and oriented to person place and time   Mood and affect are normal   Ambulation WITH CANE MILD LIMP    Examination reveals the following: On inspection we find MEDIAL AND LATERAL JOINT LINE TENDERNESS   With the range of motion of  110   Stability tests were normal     Strength tests revealed grade 5 motor strength   POSITIVE MCMURRAY MEDIAL , MILD LATERAL   Skin we find no rash ulceration or erythema   Sensation remains intact   Impression vascular  system shows no peripheral edema   Data Reviewed MRI AND PLAIN FILM   X-rays were done of the right knee, three views   There is degenerative changes of the right knee in all three compartment, moderate, with lateral narrowing, osteophytes present.  Old healed nondisplaced fracture of proximal fibula is present.  No acute fracture seen.  Slight effusion is present.   Impression:  Moderate degenerative joint disease of the right knee, more laterally.  Effusion present.  Old  healed proximal fibula fracture.   Electronically Signed Darreld Mclean, MD 8/30/20173:27 PM   MRI IMPRESSION: 1. Grade 3 oblique tear anterior horn lateral meniscus involving the superior surface and meniscal periphery, with a 1 cc parameniscal cyst tracking along the periphery. There is also horizontal grade 3 signal in the anterior horn and midbody of the lateral meniscus extending to the free edge. 2. Subtle oblique grade 3 tear involving the superior surface of the posterior horn medial meniscus. 3. Tricompartmental moderate degenerative chondral thinning. 4. Despite efforts by the technologist and patient, motion artifact is present on today's exam and could not be eliminated. This reduces exam sensitivity and specificity.     Assessment    Encounter Diagnoses  Name Primary?  . Derangement of posterior horn of medial meniscus of right knee Yes  . Meniscus, lateral, derangement, right            Plan    SARK MEDIAL AND LATERAL MENISECTOMY    This procedure has been fully reviewed with the patient and written informed consent has been obtained.  Fuller Canada, MD 06/08/2016 3:45 PM

## 2016-06-09 ENCOUNTER — Ambulatory Visit (HOSPITAL_COMMUNITY)
Admission: RE | Admit: 2016-06-09 | Discharge: 2016-06-09 | Disposition: A | Discharge status: Home / Self Care | Payer: Medicare Other | Source: Ambulatory Visit | Attending: Orthopedic Surgery | Admitting: Orthopedic Surgery

## 2016-06-09 ENCOUNTER — Ambulatory Visit (HOSPITAL_COMMUNITY): Payer: Medicare Other | Admitting: Anesthesiology

## 2016-06-09 ENCOUNTER — Encounter (HOSPITAL_COMMUNITY): Payer: Self-pay | Admitting: *Deleted

## 2016-06-09 ENCOUNTER — Encounter (HOSPITAL_COMMUNITY)
Admission: RE | Disposition: A | Discharge status: Home / Self Care | Payer: Self-pay | Source: Ambulatory Visit | Attending: Orthopedic Surgery

## 2016-06-09 DIAGNOSIS — S83241A Other tear of medial meniscus, current injury, right knee, initial encounter: Secondary | ICD-10-CM | POA: Diagnosis not present

## 2016-06-09 DIAGNOSIS — Z9989 Dependence on other enabling machines and devices: Secondary | ICD-10-CM | POA: Diagnosis not present

## 2016-06-09 DIAGNOSIS — I5032 Chronic diastolic (congestive) heart failure: Secondary | ICD-10-CM | POA: Insufficient documentation

## 2016-06-09 DIAGNOSIS — K3184 Gastroparesis: Secondary | ICD-10-CM | POA: Insufficient documentation

## 2016-06-09 DIAGNOSIS — M23341 Other meniscus derangements, anterior horn of lateral meniscus, right knee: Secondary | ICD-10-CM | POA: Diagnosis not present

## 2016-06-09 DIAGNOSIS — F419 Anxiety disorder, unspecified: Secondary | ICD-10-CM | POA: Insufficient documentation

## 2016-06-09 DIAGNOSIS — I110 Hypertensive heart disease with heart failure: Secondary | ICD-10-CM | POA: Insufficient documentation

## 2016-06-09 DIAGNOSIS — R569 Unspecified convulsions: Secondary | ICD-10-CM | POA: Insufficient documentation

## 2016-06-09 DIAGNOSIS — K589 Irritable bowel syndrome without diarrhea: Secondary | ICD-10-CM | POA: Insufficient documentation

## 2016-06-09 DIAGNOSIS — M659 Synovitis and tenosynovitis, unspecified: Secondary | ICD-10-CM | POA: Diagnosis not present

## 2016-06-09 DIAGNOSIS — E1143 Type 2 diabetes mellitus with diabetic autonomic (poly)neuropathy: Secondary | ICD-10-CM | POA: Diagnosis not present

## 2016-06-09 DIAGNOSIS — D649 Anemia, unspecified: Secondary | ICD-10-CM | POA: Insufficient documentation

## 2016-06-09 DIAGNOSIS — S83281A Other tear of lateral meniscus, current injury, right knee, initial encounter: Secondary | ICD-10-CM | POA: Insufficient documentation

## 2016-06-09 DIAGNOSIS — M1711 Unilateral primary osteoarthritis, right knee: Secondary | ICD-10-CM | POA: Diagnosis not present

## 2016-06-09 DIAGNOSIS — I272 Pulmonary hypertension, unspecified: Secondary | ICD-10-CM | POA: Insufficient documentation

## 2016-06-09 DIAGNOSIS — Z79899 Other long term (current) drug therapy: Secondary | ICD-10-CM | POA: Insufficient documentation

## 2016-06-09 DIAGNOSIS — Z794 Long term (current) use of insulin: Secondary | ICD-10-CM | POA: Insufficient documentation

## 2016-06-09 DIAGNOSIS — I480 Paroxysmal atrial fibrillation: Secondary | ICD-10-CM | POA: Insufficient documentation

## 2016-06-09 DIAGNOSIS — G4733 Obstructive sleep apnea (adult) (pediatric): Secondary | ICD-10-CM | POA: Diagnosis not present

## 2016-06-09 DIAGNOSIS — M23321 Other meniscus derangements, posterior horn of medial meniscus, right knee: Secondary | ICD-10-CM

## 2016-06-09 DIAGNOSIS — E039 Hypothyroidism, unspecified: Secondary | ICD-10-CM | POA: Insufficient documentation

## 2016-06-09 DIAGNOSIS — X58XXXA Exposure to other specified factors, initial encounter: Secondary | ICD-10-CM | POA: Insufficient documentation

## 2016-06-09 DIAGNOSIS — K219 Gastro-esophageal reflux disease without esophagitis: Secondary | ICD-10-CM | POA: Insufficient documentation

## 2016-06-09 DIAGNOSIS — Z87891 Personal history of nicotine dependence: Secondary | ICD-10-CM | POA: Insufficient documentation

## 2016-06-09 HISTORY — PX: KNEE ARTHROSCOPY WITH MEDIAL MENISECTOMY: SHX5651

## 2016-06-09 LAB — GLUCOSE, CAPILLARY
GLUCOSE-CAPILLARY: 131 mg/dL — AB (ref 65–99)
GLUCOSE-CAPILLARY: 151 mg/dL — AB (ref 65–99)

## 2016-06-09 SURGERY — ARTHROSCOPY, KNEE, WITH MEDIAL MENISCECTOMY
Anesthesia: Spinal | Laterality: Right

## 2016-06-09 MED ORDER — SODIUM CHLORIDE 0.9 % IR SOLN
Status: DC | PRN
  Administered 2016-06-09: 500 mL

## 2016-06-09 MED ORDER — SODIUM CHLORIDE 0.9 % IR SOLN
Status: DC | PRN
  Administered 2016-06-09 (×3): 3000 mL

## 2016-06-09 MED ORDER — ONDANSETRON HCL 4 MG/2ML IJ SOLN
4.0000 mg | Freq: Once | INTRAMUSCULAR | Status: AC
  Administered 2016-06-09: 4 mg via INTRAVENOUS

## 2016-06-09 MED ORDER — BUPIVACAINE-EPINEPHRINE 0.25% -1:200000 IJ SOLN
INTRAMUSCULAR | Status: DC | PRN
  Administered 2016-06-09: 60 mL

## 2016-06-09 MED ORDER — HYDROCODONE-ACETAMINOPHEN 7.5-325 MG PO TABS
ORAL_TABLET | ORAL | Status: AC
  Filled 2016-06-09: qty 1

## 2016-06-09 MED ORDER — MIDAZOLAM HCL 2 MG/2ML IJ SOLN
1.0000 mg | INTRAMUSCULAR | Status: DC | PRN
  Administered 2016-06-09: 2 mg via INTRAVENOUS

## 2016-06-09 MED ORDER — LACTATED RINGERS IV SOLN
INTRAVENOUS | Status: DC
  Administered 2016-06-09: 12:00:00 via INTRAVENOUS

## 2016-06-09 MED ORDER — FENTANYL CITRATE (PF) 100 MCG/2ML IJ SOLN
INTRAMUSCULAR | Status: AC
  Filled 2016-06-09: qty 2

## 2016-06-09 MED ORDER — PROPOFOL 10 MG/ML IV BOLUS
INTRAVENOUS | Status: AC
Start: 2016-06-09 — End: 2016-06-09
  Filled 2016-06-09: qty 40

## 2016-06-09 MED ORDER — BUPIVACAINE IN DEXTROSE 0.75-8.25 % IT SOLN
INTRATHECAL | Status: AC
Start: 2016-06-09 — End: 2016-06-09
  Filled 2016-06-09: qty 2

## 2016-06-09 MED ORDER — SEVOFLURANE IN SOLN
RESPIRATORY_TRACT | Status: AC
  Filled 2016-06-09: qty 250

## 2016-06-09 MED ORDER — HYDROCODONE-ACETAMINOPHEN 5-325 MG PO TABS: 1.0000 | ORAL_TABLET | Freq: Four times a day (QID) | ORAL | 0 refills | Status: DC | PRN

## 2016-06-09 MED ORDER — CEFAZOLIN SODIUM-DEXTROSE 2-4 GM/100ML-% IV SOLN
2.0000 g | INTRAVENOUS | Status: AC
  Administered 2016-06-09: 2 g via INTRAVENOUS
  Filled 2016-06-09: qty 100

## 2016-06-09 MED ORDER — FENTANYL CITRATE (PF) 100 MCG/2ML IJ SOLN: 25.0000 ug | INTRAMUSCULAR | Status: DC | PRN

## 2016-06-09 MED ORDER — LIDOCAINE HCL (PF) 1 % IJ SOLN
INTRAMUSCULAR | Status: AC
  Filled 2016-06-09: qty 5

## 2016-06-09 MED ORDER — HYDROCODONE-ACETAMINOPHEN 7.5-325 MG PO TABS
1.0000 | ORAL_TABLET | Freq: Once | ORAL | Status: AC
  Administered 2016-06-09: 1 via ORAL

## 2016-06-09 MED ORDER — MIDAZOLAM HCL 2 MG/2ML IJ SOLN
INTRAMUSCULAR | Status: AC
  Filled 2016-06-09: qty 2

## 2016-06-09 MED ORDER — PROMETHAZINE HCL 12.5 MG PO TABS: 12.5000 mg | ORAL_TABLET | ORAL | 0 refills | Status: DC | PRN

## 2016-06-09 MED ORDER — LIDOCAINE HCL (CARDIAC) 10 MG/ML IV SOLN
INTRAVENOUS | Status: DC | PRN
  Administered 2016-06-09: 40 mg via INTRAVENOUS

## 2016-06-09 MED ORDER — BUPIVACAINE IN DEXTROSE 0.75-8.25 % IT SOLN
INTRATHECAL | Status: DC | PRN
Start: 2016-06-09 — End: 2016-06-09
  Administered 2016-06-09: 2 mL via INTRATHECAL

## 2016-06-09 MED ORDER — SODIUM CHLORIDE 0.9 % IJ SOLN
INTRAMUSCULAR | Status: AC
  Filled 2016-06-09: qty 10

## 2016-06-09 MED ORDER — ONDANSETRON HCL 4 MG/2ML IJ SOLN
INTRAMUSCULAR | Status: AC
  Filled 2016-06-09: qty 2

## 2016-06-09 MED ORDER — ALBUTEROL SULFATE HFA 108 (90 BASE) MCG/ACT IN AERS
INHALATION_SPRAY | RESPIRATORY_TRACT | Status: AC
Start: 2016-06-09 — End: 2016-06-09
  Filled 2016-06-09: qty 6.7

## 2016-06-09 MED ORDER — LIDOCAINE HCL (PF) 1 % IJ SOLN
INTRAMUSCULAR | Status: AC
  Filled 2016-06-09: qty 10

## 2016-06-09 MED ORDER — LABETALOL HCL 5 MG/ML IV SOLN
INTRAVENOUS | Status: AC
  Filled 2016-06-09: qty 4

## 2016-06-09 MED ORDER — FENTANYL CITRATE (PF) 100 MCG/2ML IJ SOLN
INTRAMUSCULAR | Status: DC | PRN
  Administered 2016-06-09: 25 ug via INTRATHECAL

## 2016-06-09 MED ORDER — EPINEPHRINE PF 1 MG/ML IJ SOLN
INTRAMUSCULAR | Status: AC
  Filled 2016-06-09: qty 4

## 2016-06-09 MED ORDER — APIXABAN 5 MG PO TABS: 5.0000 mg | ORAL_TABLET | Freq: Two times a day (BID) | ORAL | 0 refills | Status: DC

## 2016-06-09 MED ORDER — PROPOFOL 500 MG/50ML IV EMUL
INTRAVENOUS | Status: DC | PRN
  Administered 2016-06-09: 25 ug/kg/min via INTRAVENOUS

## 2016-06-09 SURGICAL SUPPLY — 58 items
ARTHROWAND PARAGON T2 (SURGICAL WAND)
BAG HAMPER (MISCELLANEOUS) ×3 IMPLANT
BANDAGE ELASTIC 6 LF NS (GAUZE/BANDAGES/DRESSINGS) ×2 IMPLANT
BANDAGE ELASTIC 6 VELCRO NS (GAUZE/BANDAGES/DRESSINGS) ×3 IMPLANT
BLADE AGGRESSIVE PLUS 4.0 (BLADE) ×3 IMPLANT
BLADE SURG SZ11 CARB STEEL (BLADE) ×3 IMPLANT
BNDG CMPR MED 5X6 ELC HKLP NS (GAUZE/BANDAGES/DRESSINGS) ×1
CHLORAPREP W/TINT 26ML (MISCELLANEOUS) ×6 IMPLANT
CLOTH BEACON ORANGE TIMEOUT ST (SAFETY) ×3 IMPLANT
COOLER CRYO IC GRAV AND TUBE (ORTHOPEDIC SUPPLIES) ×3 IMPLANT
CUFF CRYO KNEE LG 20X31 COOLER (ORTHOPEDIC SUPPLIES) ×2 IMPLANT
CUFF CRYO KNEE18X23 MED (MISCELLANEOUS) IMPLANT
CUFF TOURNIQUET SINGLE 34IN LL (TOURNIQUET CUFF) ×2 IMPLANT
CUFF TOURNIQUET SINGLE 44IN (TOURNIQUET CUFF) IMPLANT
CUTTER ANGLED DBL BITE 4.5 (BURR) IMPLANT
DECANTER SPIKE VIAL GLASS SM (MISCELLANEOUS) ×6 IMPLANT
GAUZE SPONGE 4X4 12PLY STRL (GAUZE/BANDAGES/DRESSINGS) ×3 IMPLANT
GAUZE SPONGE 4X4 16PLY XRAY LF (GAUZE/BANDAGES/DRESSINGS) ×3 IMPLANT
GAUZE XEROFORM 5X9 LF (GAUZE/BANDAGES/DRESSINGS) ×3 IMPLANT
GLOVE BIOGEL PI IND STRL 7.0 (GLOVE) ×1 IMPLANT
GLOVE BIOGEL PI INDICATOR 7.0 (GLOVE) ×2
GLOVE SKINSENSE NS SZ8.0 LF (GLOVE) ×2
GLOVE SKINSENSE STRL SZ8.0 LF (GLOVE) ×1 IMPLANT
GLOVE SS N UNI LF 8.5 STRL (GLOVE) ×3 IMPLANT
GOWN STRL REUS W/ TWL LRG LVL3 (GOWN DISPOSABLE) ×1 IMPLANT
GOWN STRL REUS W/TWL LRG LVL3 (GOWN DISPOSABLE) ×3
GOWN STRL REUS W/TWL XL LVL3 (GOWN DISPOSABLE) ×3 IMPLANT
HLDR LEG FOAM (MISCELLANEOUS) ×1 IMPLANT
IV NS IRRIG 3000ML ARTHROMATIC (IV SOLUTION) ×6 IMPLANT
KIT BLADEGUARD II DBL (SET/KITS/TRAYS/PACK) ×3 IMPLANT
KIT ROOM TURNOVER AP CYSTO (KITS) ×3 IMPLANT
LEG HOLDER FOAM (MISCELLANEOUS) ×2
MANIFOLD NEPTUNE II (INSTRUMENTS) ×3 IMPLANT
MARKER SKIN DUAL TIP RULER LAB (MISCELLANEOUS) ×3 IMPLANT
NDL HYPO 18GX1.5 BLUNT FILL (NEEDLE) ×1 IMPLANT
NDL HYPO 21X1.5 SAFETY (NEEDLE) ×1 IMPLANT
NDL SPNL 18GX3.5 QUINCKE PK (NEEDLE) ×1 IMPLANT
NEEDLE HYPO 18GX1.5 BLUNT FILL (NEEDLE) ×3 IMPLANT
NEEDLE HYPO 21X1.5 SAFETY (NEEDLE) ×3 IMPLANT
NEEDLE SPNL 18GX3.5 QUINCKE PK (NEEDLE) ×3 IMPLANT
NS IRRIG 1000ML POUR BTL (IV SOLUTION) ×3 IMPLANT
PACK ARTHRO LIMB DRAPE STRL (MISCELLANEOUS) ×3 IMPLANT
PAD ABD 5X9 TENDERSORB (GAUZE/BANDAGES/DRESSINGS) ×3 IMPLANT
PAD ARMBOARD 7.5X6 YLW CONV (MISCELLANEOUS) ×3 IMPLANT
PADDING CAST COTTON 6X4 STRL (CAST SUPPLIES) ×3 IMPLANT
PADDING WEBRIL 6 STERILE (GAUZE/BANDAGES/DRESSINGS) ×2 IMPLANT
PROBE BIPOLAR 50 DEGREE SUCT (MISCELLANEOUS) ×2 IMPLANT
SET ARTHROSCOPY INST (INSTRUMENTS) ×3 IMPLANT
SET ARTHROSCOPY PUMP TUBE (IRRIGATION / IRRIGATOR) ×3 IMPLANT
SET BASIN LINEN APH (SET/KITS/TRAYS/PACK) ×3 IMPLANT
SUT ETHILON 3 0 FSL (SUTURE) ×3 IMPLANT
SYR 30ML LL (SYRINGE) ×3 IMPLANT
SYRINGE 10CC LL (SYRINGE) ×3 IMPLANT
TUBE CONNECTING 12'X1/4 (SUCTIONS) ×3
TUBE CONNECTING 12X1/4 (SUCTIONS) ×6 IMPLANT
WAND 50 DEG COVAC W/CORD (SURGICAL WAND) IMPLANT
WAND 90 DEG TURBOVAC W/CORD (SURGICAL WAND) IMPLANT
WAND ARTHRO PARAGON T2 (SURGICAL WAND) IMPLANT

## 2016-06-09 NOTE — Brief Op Note (Addendum)
06/09/2016  1:39 PM  PATIENT:  [name]  65 y.o. female  PRE-OPERATIVE DIAGNOSIS:  right medial and lateral meniscal tears  POST-OPERATIVE DIAGNOSIS:  right medial and lateral meniscal tears  PROCEDURE:  Procedure(s) with comments: KNEE ARTHROSCOPY WITH MEDIAL MENISECTOMY (Right) - medial and lateral menisectomy   Findings torn lateral meniscus anterior horn  medial free edge tearing posterior horn medial meniscus  Generalized arthritis and synovitis   SURGEON:  Surgeon(s) and Role:    * Vickki Hearing, MD - Primary  PHYSICIAN ASSISTANT:   There were no assistants  We used a spinal anesthesia  There was no blood noted drains.  We did use Marcaine local 60 mL in the joint  There were no specimens  Counts were correct  Dragon dictation  DICTATION: .Office manager  PLAN OF CARE: Discharge to home after PACU  PATIENT DISPOSITION:  PACU - hemodynamically stable.   Delay start of Pharmacological VTE agent (>24hrs) due to surgical blood loss or risk of bleeding: not applicable  Knee arthroscopy dictation  The patient was identified in the preoperative holding area using 2 approved identification mechanisms. The chart was reviewed and updated. The surgical site was confirmed as right knee and marked with an indelible marker.  The patient was taken to the operating room for anesthesia. After successful  spinal anesthesia, Ancef 2 g was used as IV antibiotics.  The patient was placed in the supine position with the (right) the operative extremity in an arthroscopic leg holder and the opposite extremity in a padded leg holder.  The timeout was executed.  A lateral portal was established with an 11 blade and the scope was introduced into the joint. A diagnostic arthroscopy was performed in circumferential manner examining the entire knee joint. A medial portal was established and the diagnostic arthroscopy was repeated using a probe to palpate intra-articular  structures as they were encountered.   The operative findings are   Medial free edge tear medial meniscus Lateral anterior horn lateral meniscus tear Patellofemoral mild grade 1 chondral changes Notch normal anterior cruciate ligament PCL Chondral surfaces medial lateral and patellofemoral joint grade 1-2   The medial meniscus was resected using a duckbill forceps. The meniscal fragments were removed with a motorized shaver. The meniscus was balanced with a combination of a motorized shaver and a 50 ArthroCare wand until a stable rim was obtained.  Lateral meniscus anterior horn was debrided with the shaver and small tear of the posterior horn was also debrided with a shaver then a 50 wand was used to contour the meniscus. Probing confirmed a stable rim  The arthroscopic pump was placed on the wash mode and any excess debris was removed from the joint using suction.  60 cc of Marcaine with epinephrine was injected through the arthroscope.  The portals were closed with 3-0 nylon suture.  A sterile bandage, Ace wrap and Cryo/Cuff was placed and the Cryo/Cuff was activated. The patient was taken to the recovery room in stable condition.  [id number]

## 2016-06-09 NOTE — Anesthesia Preprocedure Evaluation (Signed)
Anesthesia Evaluation  Patient identified by MRN, date of birth, ID band Patient awake    Reviewed: Allergy & Precautions, H&P , NPO status , Patient's Chart, lab work & pertinent test results  Airway Mallampati: II  TM Distance: >3 FB     Dental  (+) Edentulous Upper, Edentulous Lower, Upper Dentures, Lower Dentures, Dental Advisory Given   Pulmonary shortness of breath, with exertion and Long-Term Oxygen Therapy, sleep apnea , former smoker,  (-) CPAP   Pulmonary exam normal breath sounds clear to auscultation       Cardiovascular hypertension, Pt. on medications (-) angina+CHF ( Chronic diastolic CHF and pulm htn)  Normal cardiovascular exam+ dysrhythmias (s/p DCCV) Atrial Fibrillation  Rhythm:Irregular Rate:Normal     Neuro/Psych Seizures -,  PSYCHIATRIC DISORDERS Anxiety claustrophobia Last seizure 6 years ago (+Keppra)    GI/Hepatic hiatal hernia, GERD  Medicated,  Endo/Other  diabetes, Well Controlled, Type 2, Insulin DependentHypothyroidism   Renal/GU      Musculoskeletal   Abdominal   Peds  Hematology   Anesthesia Other Findings RA sat=85% on arrival to preop.  Reproductive/Obstetrics                             Anesthesia Physical Anesthesia Plan  ASA: III  Anesthesia Plan: Spinal   Post-op Pain Management:    Induction:   Airway Management Planned: Simple Face Mask  Additional Equipment:   Intra-op Plan:   Post-operative Plan:   Informed Consent: I have reviewed the patients History and Physical, chart, labs and discussed the procedure including the risks, benefits and alternatives for the proposed anesthesia with the patient or authorized representative who has indicated his/her understanding and acceptance.     Plan Discussed with:   Anesthesia Plan Comments:         Anesthesia Quick Evaluation

## 2016-06-09 NOTE — Discharge Instructions (Signed)
Shoulder Arthroscopy, Care After Refer to this sheet in the next few weeks. These instructions provide you with information on caring for yourself after your procedure. Your health care provider may also give you more specific instructions. Your treatment has been planned according to current medical practices, but problems sometimes occur. Call your health care provider if you have any problems or questions after your procedure. What can I expect after the procedure? After your procedure, it is typical to have the following:  Pain at the site of the surgical cuts (incisions).  Stiffness in your shoulder. This should gradually decrease over time. Your health care provider may recommend physical therapy to help improve this.  Nausea, vomiting, or constipation. These symptoms can result from taking pain medicine after surgery.  Clear or red drainage from the incision sites. This is normal for a few days after surgery.  Fatigue. Follow these instructions at home:  Take medicines only as directed by your health care provider.  Use a sling as directed.  There are many different ways to close and cover an incision, including stitches, skin glue, and adhesive strips. Follow your health care provider's instructions on:  Incision care.  Bandage (dressing) changes and removal.  Incision closure removal.  Apply ice to the injured area:  Put ice in a plastic bag.  Place a towel between your skin and the bag.  Leave the ice on for 20 minutes, 2-3 times a day.  If physical therapy and exercises are prescribed by your health care provider, do them as directed.  Keep all follow-up visits as directed by your health care provider. This is important. Contact a health care provider if: You have a fever. Get help right away if:  You have drainage, redness, swelling, or increasing pain at the incision site.  You notice a bad smell coming from the incision site or dressing.  Your incision  site breaks open after your stitches or tape has been removed. This information is not intended to replace advice given to you by your health care provider. Make sure you discuss any questions you have with your health care provider. Document Released: 01/15/2014 Document Revised: 02/14/2016 Document Reviewed: 08/09/2013 Elsevier Interactive Patient Education  2017 [employer] Remove the Cryo/Cuff when you are walking  Remove the dressing tomorrow, apply band-aids  Start knee exercises by bending the knee 253 times a day on the second day after surgery  Apply weight to the operative knee and leg as tolerated

## 2016-06-09 NOTE — Interval H&P Note (Signed)
History and Physical Interval Note:  06/09/2016 10:25 AM  [name]  has presented today for surgery, with the diagnosis of right medial and lateral meniscal tears  The various methods of treatment have been discussed with the patient and family. After consideration of risks, benefits and other options for treatment, the patient has consented to  Procedure(s) with comments: KNEE ARTHROSCOPY WITH MEDIAL MENISECTOMY (Right) - medial and lateral menisectomy as a surgical intervention .  The patient's history has been reviewed, patient examined, no change in status, stable for surgery.  I have reviewed the patient's chart and labs.  Questions were answered to the patient's satisfaction.     Fuller Canada

## 2016-06-09 NOTE — Op Note (Signed)
06/09/2016  1:39 PM  PATIENT:  [name]  65 y.o. female  PRE-OPERATIVE DIAGNOSIS:  right medial and lateral meniscal tears  POST-OPERATIVE DIAGNOSIS:  right medial and lateral meniscal tears  PROCEDURE:  Procedure(s) with comments: KNEE ARTHROSCOPY WITH MEDIAL MENISECTOMY (Right) - medial and lateral menisectomy   Findings torn lateral meniscus anterior horn  medial free edge tearing posterior horn medial meniscus  Generalized arthritis and synovitis   SURGEON:  Surgeon(s) and Role:    * Floriene Jeschke E Jamea Robicheaux, MD - Primary  PHYSICIAN ASSISTANT:   There were no assistants  We used a spinal anesthesia  There was no blood noted drains.  We did use Marcaine local 60 mL in the joint  There were no specimens  Counts were correct  Dragon dictation  DICTATION: .Dragon Dictation  PLAN OF CARE: Discharge to home after PACU  PATIENT DISPOSITION:  PACU - hemodynamically stable.   Delay start of Pharmacological VTE agent (>24hrs) due to surgical blood loss or risk of bleeding: not applicable  Knee arthroscopy dictation  The patient was identified in the preoperative holding area using 2 approved identification mechanisms. The chart was reviewed and updated. The surgical site was confirmed as right knee and marked with an indelible marker.  The patient was taken to the operating room for anesthesia. After successful  spinal anesthesia, Ancef 2 g was used as IV antibiotics.  The patient was placed in the supine position with the (right) the operative extremity in an arthroscopic leg holder and the opposite extremity in a padded leg holder.  The timeout was executed.  A lateral portal was established with an 11 blade and the scope was introduced into the joint. A diagnostic arthroscopy was performed in circumferential manner examining the entire knee joint. A medial portal was established and the diagnostic arthroscopy was repeated using a probe to palpate intra-articular  structures as they were encountered.   The operative findings are   Medial free edge tear medial meniscus Lateral anterior horn lateral meniscus tear Patellofemoral mild grade 1 chondral changes Notch normal anterior cruciate ligament PCL Chondral surfaces medial lateral and patellofemoral joint grade 1-2   The medial meniscus was resected using a duckbill forceps. The meniscal fragments were removed with a motorized shaver. The meniscus was balanced with a combination of a motorized shaver and a 50 ArthroCare wand until a stable rim was obtained.  Lateral meniscus anterior horn was debrided with the shaver and small tear of the posterior horn was also debrided with a shaver then a 50 wand was used to contour the meniscus. Probing confirmed a stable rim  The arthroscopic pump was placed on the wash mode and any excess debris was removed from the joint using suction.  60 cc of Marcaine with epinephrine was injected through the arthroscope.  The portals were closed with 3-0 nylon suture.  A sterile bandage, Ace wrap and Cryo/Cuff was placed and the Cryo/Cuff was activated. The patient was taken to the recovery room in stable condition.  [id number]    

## 2016-06-09 NOTE — Transfer of Care (Signed)
Immediate Anesthesia Transfer of Care Note  Patient: [name]  Procedure(s) Performed: Procedure(s) with comments: KNEE ARTHROSCOPY WITH MEDIAL MENISECTOMY (Right) - medial and lateral menisectomy  Patient Location: PACU  Anesthesia Type:Spinal  Level of Consciousness: awake, alert , oriented and patient cooperative  Airway & Oxygen Therapy: Patient Spontanous Breathing and Patient connected to face mask oxygen  Post-op Assessment: Report given to RN and Post -op Vital signs reviewed and stable  Post vital signs: Reviewed and stable  Last Vitals:  Vitals:   06/09/16 1100 06/09/16 1200  BP:    Resp: 19 13  Temp:      Last Pain:  Vitals:   06/09/16 1041  TempSrc: Oral      Patients Stated Pain Goal: 8 (06/09/16 1041)  Complications: No apparent anesthesia complications

## 2016-06-09 NOTE — Anesthesia Procedure Notes (Addendum)
Spinal  Patient location during procedure: OR Start time: 06/09/2016 12:40 PM End time: 06/09/2016 12:48 PM Staffing Anesthesiologist: Laurene Footman Performed: anesthesiologist  Spinal Block Patient position: right lateral decubitus Prep: Betadine Patient monitoring: heart rate, cardiac monitor, continuous pulse ox and blood pressure Approach: midline Location: L3-4 Injection technique: single-shot Needle Needle type: Spinocan  Needle gauge: 22 G Assessment Sensory level: T10 Additional Notes Lot Number: [id number]

## 2016-06-09 NOTE — Anesthesia Postprocedure Evaluation (Signed)
Anesthesia Post Note  Patient: [name]  Procedure(s) Performed: Procedure(s) (LRB): KNEE ARTHROSCOPY WITH MEDIAL MENISECTOMY (Right)  Patient location during evaluation: PACU Anesthesia Type: Spinal Level of consciousness: awake and alert Pain management: pain level controlled Vital Signs Assessment: post-procedure vital signs reviewed and stable Respiratory status: spontaneous breathing Cardiovascular status: stable Anesthetic complications: no    Last Vitals:  Vitals:   06/09/16 1346 06/09/16 1400  BP: (!) 183/88 (!) 173/81  Pulse:  63  Resp:  17  Temp: 36.8 C     Last Pain:  Vitals:   06/09/16 1041  TempSrc: Oral                 Rashell Shambaugh

## 2016-06-10 ENCOUNTER — Encounter (HOSPITAL_COMMUNITY): Payer: Self-pay | Admitting: Orthopedic Surgery

## 2016-06-10 ENCOUNTER — Ambulatory Visit: Payer: Medicare Other | Admitting: Cardiology

## 2016-06-13 ENCOUNTER — Ambulatory Visit (INDEPENDENT_AMBULATORY_CARE_PROVIDER_SITE_OTHER): Payer: Medicare Other | Admitting: Orthopedic Surgery

## 2016-06-13 ENCOUNTER — Encounter: Payer: Self-pay | Admitting: Orthopedic Surgery

## 2016-06-13 ENCOUNTER — Telehealth: Payer: Self-pay | Admitting: Orthopedic Surgery

## 2016-06-13 DIAGNOSIS — Z9889 Other specified postprocedural states: Secondary | ICD-10-CM

## 2016-06-13 NOTE — Telephone Encounter (Signed)
Patient's daughter called stating that [name] laid down and during that time the strips you put on her surgical site after removing the staples/stitiches this morning have come off and she has some bleeding. Daughter wants to know if she can put some neosporin and band aids on the site.  Please call and advise

## 2016-06-13 NOTE — Progress Notes (Signed)
Patient ID: [name], female   DOB: 12/09/1950, 65 y.o.   MRN: [id number]  Post op visit   Chief Complaint  Patient presents with  . Follow-up    POST OP 1, SARK, DOS 06/09/16     PRE-OPERATIVE DIAGNOSIS:  right medial and lateral meniscal tears   POST-OPERATIVE DIAGNOSIS:  right medial and lateral meniscal tears   PROCEDURE:  Procedure(s) with comments: KNEE ARTHROSCOPY WITH MEDIAL MENISECTOMY (Right) - medial and lateral menisectomy     Findings torn lateral meniscus anterior horn   medial free edge tearing posterior horn medial meniscus   Generalized arthritis and synovitis   The suture portals were removed disease portals look clean the knee has minimal swelling she can bend about 90  Encounter Diagnosis  Name Primary?  . S/P right knee arthroscopy Yes    Recommend physical therapy 3 times a week for 3 weeks follow-up in 4 weeks

## 2016-06-13 NOTE — Addendum Note (Signed)
Addended by: Adella Hare B on: 06/13/2016 09:57 AM   Modules accepted: Orders

## 2016-06-13 NOTE — Patient Instructions (Signed)
START THERAPY 

## 2016-06-13 NOTE — Telephone Encounter (Signed)
Advised yes ok to put neosporin and band aid, advised to keep covered

## 2016-06-17 ENCOUNTER — Other Ambulatory Visit: Payer: Self-pay | Admitting: Internal Medicine

## 2016-06-17 ENCOUNTER — Other Ambulatory Visit: Payer: Self-pay | Admitting: Gastroenterology

## 2016-06-21 ENCOUNTER — Other Ambulatory Visit: Payer: Self-pay | Admitting: *Deleted

## 2016-06-21 DIAGNOSIS — Z9889 Other specified postprocedural states: Secondary | ICD-10-CM

## 2016-06-24 DIAGNOSIS — M25561 Pain in right knee: Secondary | ICD-10-CM | POA: Diagnosis not present

## 2016-06-24 DIAGNOSIS — R262 Difficulty in walking, not elsewhere classified: Secondary | ICD-10-CM | POA: Diagnosis not present

## 2016-06-24 DIAGNOSIS — R2681 Unsteadiness on feet: Secondary | ICD-10-CM | POA: Diagnosis not present

## 2016-06-24 DIAGNOSIS — M6281 Muscle weakness (generalized): Secondary | ICD-10-CM | POA: Diagnosis not present

## 2016-06-29 DIAGNOSIS — M25561 Pain in right knee: Secondary | ICD-10-CM | POA: Diagnosis not present

## 2016-06-29 DIAGNOSIS — R262 Difficulty in walking, not elsewhere classified: Secondary | ICD-10-CM | POA: Diagnosis not present

## 2016-06-29 DIAGNOSIS — M6281 Muscle weakness (generalized): Secondary | ICD-10-CM | POA: Diagnosis not present

## 2016-06-29 DIAGNOSIS — R2681 Unsteadiness on feet: Secondary | ICD-10-CM | POA: Diagnosis not present

## 2016-06-30 DIAGNOSIS — M6281 Muscle weakness (generalized): Secondary | ICD-10-CM | POA: Diagnosis not present

## 2016-06-30 DIAGNOSIS — R262 Difficulty in walking, not elsewhere classified: Secondary | ICD-10-CM | POA: Diagnosis not present

## 2016-06-30 DIAGNOSIS — M25561 Pain in right knee: Secondary | ICD-10-CM | POA: Diagnosis not present

## 2016-06-30 DIAGNOSIS — R2681 Unsteadiness on feet: Secondary | ICD-10-CM | POA: Diagnosis not present

## 2016-07-01 ENCOUNTER — Ambulatory Visit (HOSPITAL_COMMUNITY): Payer: Medicare Other | Admitting: Oncology

## 2016-07-01 DIAGNOSIS — E039 Hypothyroidism, unspecified: Secondary | ICD-10-CM | POA: Diagnosis not present

## 2016-07-01 DIAGNOSIS — E1165 Type 2 diabetes mellitus with hyperglycemia: Secondary | ICD-10-CM | POA: Diagnosis not present

## 2016-07-01 DIAGNOSIS — I5032 Chronic diastolic (congestive) heart failure: Secondary | ICD-10-CM | POA: Diagnosis not present

## 2016-07-01 DIAGNOSIS — J069 Acute upper respiratory infection, unspecified: Secondary | ICD-10-CM | POA: Diagnosis not present

## 2016-07-01 NOTE — Progress Notes (Signed)
-  Rescheduled-  Review of Systems    

## 2016-07-01 NOTE — Assessment & Plan Note (Deleted)
Iron deficiency anemia having undergone an extensive GI work-up that was negative including colonoscopy (11/08/2013), EGD (11/21/2014), and camera capsule study.  Now on ferrex forte and IV iron replacement when indicated.  Labs today: CBC diff, iron/TIBC, ferritin.  I personally reviewed and went over laboratory results with the patient.  The results are noted within this dictation.  She is due for mammogram in ~1 month.  Order is placed for screening mammogram.  Right knee arthroscopy by Dr. Romeo Apple on 06/09/2016 for a right medial and lateral meniscal tears.  Labs in 3 months: CBC, BMET, ferritin  Labs in 6 months: CBC diff, BMET, iron/TIBC, ferritin  Return in 6 months for follow-up

## 2016-07-08 ENCOUNTER — Ambulatory Visit: Payer: Medicare Other | Admitting: Gastroenterology

## 2016-07-11 ENCOUNTER — Ambulatory Visit (INDEPENDENT_AMBULATORY_CARE_PROVIDER_SITE_OTHER): Payer: Self-pay | Admitting: Orthopedic Surgery

## 2016-07-11 DIAGNOSIS — Z9889 Other specified postprocedural states: Secondary | ICD-10-CM

## 2016-07-11 DIAGNOSIS — Z4889 Encounter for other specified surgical aftercare: Secondary | ICD-10-CM

## 2016-07-11 NOTE — Patient Instructions (Signed)
Continue therapy

## 2016-07-11 NOTE — Progress Notes (Signed)
Patient ID: [name], female   DOB: 1950/09/10, 66 y.o.   MRN: [id number]  Follow up visit/  postop  Chief Complaint  Patient presents with  . Follow-up    Post op recheck on right knee, SARK, DOS 06-09-16.    Right knee arthroscopy meniscectomy.  She complained of some posterior pain with knee extension but she's regained 120 of knee flexion no swelling portal sites are clean   Encounter Diagnoses  Name Primary?  . S/P right knee arthroscopy   . Aftercare following surgery Yes   Continue therapy follow-up one month   11:16 AM Fuller Canada, MD 07/11/2016

## 2016-07-15 ENCOUNTER — Encounter: Payer: Self-pay | Admitting: Adult Health

## 2016-07-15 ENCOUNTER — Ambulatory Visit (INDEPENDENT_AMBULATORY_CARE_PROVIDER_SITE_OTHER): Payer: Medicare Other | Admitting: Adult Health

## 2016-07-15 VITALS — BP 136/86 | HR 70 | Ht 66.0 in | Wt 238.0 lb

## 2016-07-15 DIAGNOSIS — I10 Essential (primary) hypertension: Secondary | ICD-10-CM

## 2016-07-15 DIAGNOSIS — I5032 Chronic diastolic (congestive) heart failure: Secondary | ICD-10-CM | POA: Diagnosis not present

## 2016-07-15 DIAGNOSIS — I482 Chronic atrial fibrillation: Secondary | ICD-10-CM | POA: Diagnosis not present

## 2016-07-15 DIAGNOSIS — I4821 Permanent atrial fibrillation: Secondary | ICD-10-CM

## 2016-07-15 NOTE — Progress Notes (Signed)
Cardiology Office Note   Date:  07/15/2016   ID:  Tara, [name] [birth year], MRN [id number]  PCP:  [name], MD  Cardiologist: Arlington Calix, NP   Chief Complaint  Patient presents with  . Congestive Heart Failure  . Atrial Fibrillation      History of Present Illness: [name] is a 66 y.o. female who presents for ongoing assessment and management of chronic mixed CHF, pulmonary hypertension, atrial fibrillation, history of chronic ascites and anemia. The patient is was last seen in October 2017 and had been clinically stable. She was planned to have a right knee arthroscopic surgery with Dr. Romeo Apple. This was completed on 06/13/2016. She was referred to physical therapy postoperatively. She continues to be followed by hematology in the setting of iron deficiency anemia.  She is doing well post knee surgery. She continues with PT. She denies palpitations, dizziness, or increased dyspnea. She is down 10lbs. She is medically compliant and does not have any issues with active bleeding or melena.   Past Medical History:  Diagnosis Date  . Anemia    H/H of 10/30 with a normal MCV in 12/09  . Anxiety   . Arthritis   . Barrett's esophagus    Diagnosed 1995. Last EGD 2016-NO BARRETT'S.   . Chest pain    Negative cardiac catheterization in 2002; negative stress nuclear study in 2008  . Chronic anticoagulation   . Chronic diastolic CHF (congestive heart failure) (HCC)    a. EF predominantly normal during prior echoes but was 40% during 10/2014 echo. b. Most recent 01/2015 EF normal, 55-60%.  . Chronic LBP    Surgical intervention in 1996  . Diabetes mellitus, type 2 (HCC)    Insulin therapy; exacerbated by prednisone  . Dysrhythmia    AFib  . Gastroparesis    99% retention 05/2008 on GES  . GERD (gastroesophageal reflux disease)   . Hiatal hernia   . Hyperlipidemia   . Hypertension   . Hypothyroid   . IBS (irritable bowel syndrome)   . Obesity   .  OSA on CPAP    had CPAP and cannot tolerate.  . Paroxysmal atrial fibrillation (HCC)   . Pulmonary hypertension 01/2015   a. Predominantly pulmonary venous hypertension but may be component of PAH.  . Seizures (HCC)    last seizure was 2 years ago; on keppra for this; unknown etiology  . Syncope    a. Admitted 05/2009; magnetic resonance imagin/ MRA - negative; etiology thought to be orthostasis secondary to drugs and dehydration. b. Syncope 02/2015 also felt 2/2 dehydration.    Past Surgical History:  Procedure Laterality Date  . BACK SURGERY  1996  . BIOPSY N/A 11/08/2013   Procedure: BIOPSY  / Tissue sampling / ulcers present in small intestine;  Surgeon: West Bali, MD;  Location: [facility];  Service: Endoscopy;  Laterality: N/A;  . CARDIAC CATHETERIZATION  2002  . CARDIAC CATHETERIZATION N/A 01/26/2015   Procedure: Right Heart Cath;  Surgeon: Laurey Morale, MD;  Location: [facility] INVASIVE CV LAB;  Service: Cardiovascular;  Laterality: N/A;  . CARDIOVASCULAR STRESS TEST  2008   Stress nuclear study  . CARDIOVERSION N/A 03/06/2015   Procedure: CARDIOVERSION;  Surgeon: Laurey Morale, MD;  Location: [facility] ENDOSCOPY;  Service: Cardiovascular;  Laterality: N/A;  . CARPAL TUNNEL RELEASE  1994  . COLONOSCOPY  11/2011   Dr. Darrick Penna: Internal hemorrhoids, mild diverticulosis. Random colon biopsies negative.  . COLONOSCOPY  N/A 11/08/2013   SLF: Normal mucosa in the terminal ileum/The colon IS redundant/  Moderate diverticulosis throughout the entire colon. ileum bx benign. colon bx benign  . ESOPHAGOGASTRODUODENOSCOPY  2008   Barrett's without dysplasia. esphagus dilated. antral erosions, h.pylori serologies negative.  . ESOPHAGOGASTRODUODENOSCOPY  11/2011   Dr. Darrick Penna: Barrett's esophagus, mild gastritis, diverticulum in the second portion of the duodenum repeat EGD 3 years. Small bowel biopsies negative. Gastric biopsy show reactive gastropathy but no H. pylori. Esophageal biopsies consistent with  GERD. Next EGD 11/2014  . ESOPHAGOGASTRODUODENOSCOPY N/A 11/21/2014   ZOX:WRUE non-erosive gastritis/irregular z-line  . GIVENS CAPSULE STUDY  12/07/2011   Proximal small bowel, rare AVM. Distal small bowel, multiple ulcers noted  . GIVENS CAPSULE STUDY N/A 09/27/2013   Distal small bowel ulcers extending to TI.  Marland Kitchen GIVENS CAPSULE STUDY N/A 10/10/2013   Procedure: GIVENS CAPSULE STUDY;  Surgeon: West Bali, MD;  Location: [facility];  Service: Endoscopy;  Laterality: N/A;  7:30  . KNEE ARTHROSCOPY WITH MEDIAL MENISECTOMY Right 06/09/2016   Procedure: KNEE ARTHROSCOPY WITH MEDIAL MENISECTOMY;  Surgeon: Vickki Hearing, MD;  Location: [facility] ORS;  Service: Orthopedics;  Laterality: Right;  medial and lateral menisectomy  . LAMINECTOMY  1995   L4-L5  . LAPAROSCOPIC CHOLECYSTECTOMY  1990s  . LEFT HEART CATHETERIZATION WITH CORONARY ANGIOGRAM  01/10/2014   Procedure: LEFT HEART CATHETERIZATION WITH CORONARY ANGIOGRAM;  Surgeon: Lesleigh Noe, MD;  Location: [facility] CATH LAB;  Service: Cardiovascular;;  . RIGHT HEART CATHETERIZATION N/A 01/10/2014   Procedure: RIGHT HEART CATH;  Surgeon: Lesleigh Noe, MD;  Location: [facility] CATH LAB;  Service: Cardiovascular;  Laterality: N/A;  . TOTAL ABDOMINAL HYSTERECTOMY  1999     Current Outpatient Prescriptions  Medication Sig Dispense Refill  . acetaminophen (TYLENOL) 500 MG tablet Take 500 mg by mouth every 6 (six) hours as needed for moderate pain or headache.    Marland Kitchen amiodarone (PACERONE) 200 MG tablet TAKE 1 TABLET BY MOUTH EVERY DAY 30 tablet 3  . apixaban (ELIQUIS) 5 MG TABS tablet Take 1 tablet (5 mg total) by mouth 2 (two) times daily. 60 tablet 0  . dicyclomine (BENTYL) 10 MG capsule TAKE 1 TO 2 CAPSULES BY MOUTH BEFORE MEALS AS NEEDED FOR ABDOMINAL CRAMPING AND LOOSE STOOLS 180 capsule 3  . DULoxetine (CYMBALTA) 30 MG capsule Take 30 mg by mouth 2 (two) times daily.    . famotidine (PEPCID) 20 MG tablet Take 20 mg by mouth at bedtime.     . furosemide  (LASIX) 40 MG tablet Take 1 tablet (40 mg total) by mouth daily. (Patient taking differently: Take 20 mg by mouth daily. ) 30 tablet 0  . gabapentin (NEURONTIN) 300 MG capsule Take 300 mg by mouth 3 (three) times daily.    Marland Kitchen glipiZIDE (GLUCOTROL) 10 MG tablet Take 10 mg by mouth 2 (two) times daily before a meal.     . HYDROcodone-acetaminophen (NORCO/VICODIN) 5-325 MG tablet Take 1 tablet by mouth every 6 (six) hours as needed for moderate pain. 42 tablet 0  . Insulin Glargine (LANTUS SOLOSTAR) 100 UNIT/ML Solostar Pen Inject 20-50 Units into the skin at bedtime as needed. Patient takes 20 units every night at bedtime if over 178.  Takes 50 units if her blood sugar is above 300.    . Iron Polysacch Cmplx-B12-FA 150-0.025-1 MG CAPS TAKE 1 TABLET DAILY 30 each 5  . levETIRAcetam (KEPPRA) 750 MG tablet Take 750 mg by mouth 2 (two)  times daily.    Marland Kitchen levothyroxine (SYNTHROID, LEVOTHROID) 137 MCG tablet Take 137 mcg by mouth daily.    Marland Kitchen loperamide (IMODIUM) 2 MG capsule Take 1 capsule (2 mg total) by mouth as needed for diarrhea or loose stools. 30 capsule 5  . LORazepam (ATIVAN) 1 MG tablet Take 1 mg by mouth 3 (three) times daily as needed for anxiety or sleep. For anxiety    . magnesium oxide (MAG-OX) 400 MG tablet Take 400 mg by mouth daily.     . metaxalone (SKELAXIN) 800 MG tablet Take 800 mg by mouth 3 (three) times daily.     . metFORMIN (GLUCOPHAGE) 500 MG tablet Take 1,000 mg by mouth 2 (two) times daily with a meal.     . Multiple Vitamin (MULTIVITAMIN WITH MINERALS) TABS tablet Take 1 tablet by mouth daily.    . nitroGLYCERIN (NITROSTAT) 0.4 MG SL tablet Place 0.4 mg under the tongue every 5 (five) minutes as needed for chest pain. Reported on 07/08/2015    . ondansetron (ZOFRAN) 4 MG tablet Take 1 tablet (4 mg total) by mouth 4 (four) times daily -  before meals and at bedtime. As needed for nausea. 120 tablet 11  . pantoprazole (PROTONIX) 40 MG tablet Take 1 tablet (40 mg total) by mouth 2  (two) times daily before a meal. 180 tablet 3  . potassium chloride (K-DUR) 10 MEQ tablet TAKE 2 TABLETS BY MOUTH TWICE DAILY FOR LOW POTASSIUM 120 tablet 3  . potassium chloride (K-DUR,KLOR-CON) 10 MEQ tablet TAKE 2 TABLETS BY MOUTH TWICE DAILY FOR LOW POTASSIUM 120 tablet 3  . promethazine (PHENERGAN) 12.5 MG tablet Take 1 tablet (12.5 mg total) by mouth every 4 (four) hours as needed for nausea or vomiting. 42 tablet 0  . vitamin B-12 (CYANOCOBALAMIN) 500 MCG tablet Take 500 mcg by mouth daily.     No current facility-administered medications for this visit.     Allergies:   Ibuprofen; Codeine; Dilaudid [hydromorphone hcl]; Celexa [citalopram hydrobromide]; and Reglan [metoclopramide]    Social History:  The patient  reports that she quit smoking about 33 years ago. Her smoking use included Cigarettes. She started smoking about 50 years ago. She has a 3.75 pack-year smoking history. She has never used smokeless tobacco. She reports that she does not drink alcohol or use drugs.   Family History:  The patient's family history includes Alzheimer's disease in her mother; Breast cancer in her sister; Heart attack in her mother; Hypertension in her mother and other; Stroke in her mother.    ROS: All other systems are reviewed and negative. Unless otherwise mentioned in H&P    PHYSICAL EXAM: VS:  BP 136/86   Pulse 70   Ht 5\' 6"  (1.676 m)   Wt 238 lb (108 kg)   SpO2 91%   BMI 38.41 kg/m  , BMI Body mass index is 38.41 kg/m. GEN: Well nourished, well developed, in no acute distress  HEENT: normal  Neck: no JVD, carotid bruits, or masses Cardiac: IRRR; no murmurs, rubs, or gallops,no edema  Respiratory:  Clear to auscultation bilaterally, mildly diminished in the bases.  MS: no deformity or atrophy Right knee mildly edematous. She is favoring it when she walks with cane.  Skin: warm and dry, no rash Neuro:  Strength and sensation are intact Psych: euthymic mood, full affect   Recent  Labs: 12/26/2015: ALT 18; TSH 2.553 06/07/2016: BUN 13; Creatinine, Ser 0.99; Hemoglobin 12.0; Platelets 211; Potassium 4.4; Sodium 138  Lipid Panel    Component Value Date/Time   CHOL 77 09/02/2011 0446   TRIG 88 09/02/2011 0446   HDL 35 (L) 09/02/2011 0446   CHOLHDL 2.2 09/02/2011 0446   VLDL 18 09/02/2011 0446   LDLCALC 24 09/02/2011 0446      Wt Readings from Last 3 Encounters:  07/15/16 238 lb (108 kg)  06/07/16 248 lb (112.5 kg)  05/11/16 242 lb (109.8 kg)      Other studies Reviewed: Echocardiogram 01-18-16 Left ventricle: The cavity size was normal. Systolic function was   normal. Wall motion was normal; there were no regional wall   motion abnormalities. Doppler parameters are consistent with   abnormal left ventricular relaxation (grade 1 diastolic   dysfunction). Doppler parameters are consistent with elevated   ventricular end-diastolic filling pressure. - Aortic root: The aortic root was normal in size. - Mitral valve: Calcified annulus. Mildly thickened leaflets .   There was mild regurgitation. - Left atrium: The atrium was moderately dilated. - Right ventricle: The cavity size was normal. Wall thickness was   normal. Systolic function was normal. - Right atrium: The atrium was normal in size. - Tricuspid valve: There was no regurgitation. - Pulmonary arteries: Systolic pressure was mildly increased. PA   peak pressure: 44 mm Hg (S). - Inferior vena cava: The vessel was normal in size. - Pericardium, extracardiac: There was no pericardial effusion.  ASSESSMENT AND PLAN:  1.  Atrial fib: Heart rate is well controlled currently. No active or discrete bleeding. Continue amiodarone and Eliquis. Labs are being completed by haematology.   2. Chronic Diastolic CHF: No evidence of fluid retention, no edema. She is actually down 10 lbs. Will continue regimen. She will weigh daily and watch salt intake.   3. Anemia: Followed by hematology.    Current  medicines are reviewed at length with the patient today.    Labs/ tests ordered today include:  No orders of the defined types were placed in this encounter.    Disposition:   FU with  4 months.  Signed, Joni Reining, NP  07/15/2016 1:41 PM    [facility] [address] [place], [place] [address] Phone: [phone]; Fax: [phone]

## 2016-07-15 NOTE — Patient Instructions (Signed)
Your physician wants you to follow-up in: 4 Months with Dr. Branch. You will receive a reminder letter in the mail two months in advance. If you don't receive a letter, please call our office to schedule the follow-up appointment.  Your physician recommends that you continue on your current medications as directed. Please refer to the Current Medication list given to you today.  If you need a refill on your cardiac medications before your next appointment, please call your pharmacy.  Thank you for choosing [facility]!   

## 2016-07-15 NOTE — Progress Notes (Signed)
Name: [name]    DOB: 06/01/1951  Age: 66 y.o.  MR#: [id number]       PCP:  [name], MD      Insurance: Payor: MEDICARE / Plan: MEDICARE PART A AND B / Product Type: *No Product type* /   CC:    Chief Complaint  Patient presents with  . Congestive Heart Failure  . Atrial Fibrillation    VS Vitals:   07/15/16 1321  BP: 136/86  Pulse: 70  SpO2: 91%  Weight: 238 lb (108 kg)  Height: 5\' 6"  (1.676 m)    Weights Current Weight  07/15/16 238 lb (108 kg)  06/07/16 248 lb (112.5 kg)  05/11/16 242 lb (109.8 kg)    Blood Pressure  BP Readings from Last 3 Encounters:  07/15/16 136/86  06/09/16 (!) 174/68  06/07/16 (!) 121/43     Admit date:  (Not on file) Last encounter with RMR:  04/15/2016   Allergy Ibuprofen; Codeine; Dilaudid [hydromorphone hcl]; Celexa [citalopram hydrobromide]; and Reglan [metoclopramide]  Current Outpatient Prescriptions  Medication Sig Dispense Refill  . acetaminophen (TYLENOL) 500 MG tablet Take 500 mg by mouth every 6 (six) hours as needed for moderate pain or headache.    Marland Kitchen amiodarone (PACERONE) 200 MG tablet TAKE 1 TABLET BY MOUTH EVERY DAY 30 tablet 3  . apixaban (ELIQUIS) 5 MG TABS tablet Take 1 tablet (5 mg total) by mouth 2 (two) times daily. 60 tablet 0  . dicyclomine (BENTYL) 10 MG capsule TAKE 1 TO 2 CAPSULES BY MOUTH BEFORE MEALS AS NEEDED FOR ABDOMINAL CRAMPING AND LOOSE STOOLS 180 capsule 3  . DULoxetine (CYMBALTA) 30 MG capsule Take 30 mg by mouth 2 (two) times daily.    . famotidine (PEPCID) 20 MG tablet Take 20 mg by mouth at bedtime.     . furosemide (LASIX) 40 MG tablet Take 1 tablet (40 mg total) by mouth daily. (Patient taking differently: Take 20 mg by mouth daily. ) 30 tablet 0  . gabapentin (NEURONTIN) 300 MG capsule Take 300 mg by mouth 3 (three) times daily.    Marland Kitchen glipiZIDE (GLUCOTROL) 10 MG tablet Take 10 mg by mouth 2 (two) times daily before a meal.     . HYDROcodone-acetaminophen (NORCO/VICODIN) 5-325 MG tablet  Take 1 tablet by mouth every 6 (six) hours as needed for moderate pain. 42 tablet 0  . Insulin Glargine (LANTUS SOLOSTAR) 100 UNIT/ML Solostar Pen Inject 20-50 Units into the skin at bedtime as needed. Patient takes 20 units every night at bedtime if over 178.  Takes 50 units if her blood sugar is above 300.    . Iron Polysacch Cmplx-B12-FA 150-0.025-1 MG CAPS TAKE 1 TABLET DAILY 30 each 5  . levETIRAcetam (KEPPRA) 750 MG tablet Take 750 mg by mouth 2 (two) times daily.    Marland Kitchen levothyroxine (SYNTHROID, LEVOTHROID) 137 MCG tablet Take 137 mcg by mouth daily.    Marland Kitchen loperamide (IMODIUM) 2 MG capsule Take 1 capsule (2 mg total) by mouth as needed for diarrhea or loose stools. 30 capsule 5  . LORazepam (ATIVAN) 1 MG tablet Take 1 mg by mouth 3 (three) times daily as needed for anxiety or sleep. For anxiety    . magnesium oxide (MAG-OX) 400 MG tablet Take 400 mg by mouth daily.     . metaxalone (SKELAXIN) 800 MG tablet Take 800 mg by mouth 3 (three) times daily.     . metFORMIN (GLUCOPHAGE) 500 MG tablet Take 1,000 mg by mouth  2 (two) times daily with a meal.     . Multiple Vitamin (MULTIVITAMIN WITH MINERALS) TABS tablet Take 1 tablet by mouth daily.    . nitroGLYCERIN (NITROSTAT) 0.4 MG SL tablet Place 0.4 mg under the tongue every 5 (five) minutes as needed for chest pain. Reported on 07/08/2015    . ondansetron (ZOFRAN) 4 MG tablet Take 1 tablet (4 mg total) by mouth 4 (four) times daily -  before meals and at bedtime. As needed for nausea. 120 tablet 11  . pantoprazole (PROTONIX) 40 MG tablet Take 1 tablet (40 mg total) by mouth 2 (two) times daily before a meal. 180 tablet 3  . potassium chloride (K-DUR) 10 MEQ tablet TAKE 2 TABLETS BY MOUTH TWICE DAILY FOR LOW POTASSIUM 120 tablet 3  . potassium chloride (K-DUR,KLOR-CON) 10 MEQ tablet TAKE 2 TABLETS BY MOUTH TWICE DAILY FOR LOW POTASSIUM 120 tablet 3  . promethazine (PHENERGAN) 12.5 MG tablet Take 1 tablet (12.5 mg total) by mouth every 4 (four) hours as  needed for nausea or vomiting. 42 tablet 0  . vitamin B-12 (CYANOCOBALAMIN) 500 MCG tablet Take 500 mcg by mouth daily.     No current facility-administered medications for this visit.     Discontinued Meds:   There are no discontinued medications.  Patient Active Problem List   Diagnosis Date Noted  . Derangement of posterior horn of medial meniscus of right knee   . Lateral meniscus, anterior horn derangement, right   . Syncope, near 12/26/2015  . Fall at home 12/26/2015  . Dysphagia 12/07/2015  . IDA (iron deficiency anemia) 03/13/2015  . Chronic diastolic CHF (congestive heart failure) (HCC) 02/20/2015  . UTI (urinary tract infection) 02/07/2015  . Syncope and collapse 02/05/2015  . Dehydration   . Acute on chronic combined systolic and diastolic CHF (congestive heart failure) (HCC) 01/26/2015  . Low hemoglobin   . Diffuse abdominal pain 09/16/2014  . Ascites 08/27/2014  . Anemia 07/20/2014  . Epistaxis, recurrent 07/20/2014  . Syncope 04/11/2014  . Dementia 04/11/2014  . Chronic diarrhea 04/11/2014  . DM type 2 (diabetes mellitus, type 2) (HCC) 04/11/2014  . Protein-calorie malnutrition, severe (HCC) 04/11/2014  . Acute CHF (HCC) 12/21/2013  . Atrial fibrillation with RVR (HCC) 12/21/2013  . Chest pain at rest 12/03/2013  . Obesity 12/03/2013  . Chest pain 12/03/2013  . Diarrhea 08/26/2013  . Abnormal weight loss 08/26/2013  . Lower abdominal pain 08/26/2013  . Anorexia nervosa 08/26/2013  . Encounter for therapeutic drug monitoring 08/26/2013  . Dyspnea 06/29/2013  . Tremor 10/27/2012  . Chronic anticoagulation 06/23/2011  . HYPOTENSION, ORTHOSTATIC 08/26/2009  . Hyperlipidemia 03/04/2009  . Barrett's esophagus 09/26/2008  . Gastroparesis 09/26/2008  . Iron deficiency anemia 09/25/2008  . Essential hypertension 09/25/2008  . LUNG NODULE 09/25/2008  . Irritable bowel syndrome 09/25/2008  . SLEEP APNEA 09/25/2008  . CARPAL TUNNEL SYNDROME, HX OF 09/25/2008   . Hypothyroidism 04/10/2008  . DIABETES MELLITUS, TYPE II, ON INSULIN 04/10/2008  . OBESITY 04/10/2008  . Atrial fibrillation (HCC) 04/10/2008  . Congestive heart failure (HCC) 04/10/2008  . LOW BACK PAIN, CHRONIC 04/10/2008  . CHEST PAIN-PRECORDIAL 04/10/2008    LABS    Component Value Date/Time   NA 138 06/07/2016 0917   NA 135 12/27/2015 0904   NA 138 12/26/2015 0455   K 4.4 06/07/2016 0917   K 3.2 (L) 12/27/2015 0904   K 3.5 12/26/2015 0455   CL 106 06/07/2016 0917   CL 105 12/27/2015 0904  CL 108 12/26/2015 0455   CO2 26 06/07/2016 0917   CO2 23 12/27/2015 0904   CO2 23 12/26/2015 0455   GLUCOSE 157 (H) 06/07/2016 0917   GLUCOSE 234 (H) 12/27/2015 0904   GLUCOSE 173 (H) 12/26/2015 0455   BUN 13 06/07/2016 0917   BUN 22 (H) 12/27/2015 0904   BUN 24 (H) 12/26/2015 0455   CREATININE 0.99 06/07/2016 0917   CREATININE 1.00 12/27/2015 0904   CREATININE 1.44 (H) 12/26/2015 0455   CREATININE 0.70 02/13/2015 1414   CREATININE 0.75 11/29/2014 0803   CREATININE 0.68 10/04/2014 1142   CALCIUM 8.9 06/07/2016 0917   CALCIUM 8.2 (L) 12/27/2015 0904   CALCIUM 8.3 (L) 12/26/2015 0455   CALCIUM 8.4 06/30/2013 0318   GFRNONAA 58 (L) 06/07/2016 0917   GFRNONAA 57 (L) 12/27/2015 0904   GFRNONAA 37 (L) 12/26/2015 0455   GFRNONAA 78 08/26/2013 0921   GFRAA >60 06/07/2016 0917   GFRAA >60 12/27/2015 0904   GFRAA 43 (L) 12/26/2015 0455   GFRAA >89 08/26/2013 0921   CMP     Component Value Date/Time   NA 138 06/07/2016 0917   K 4.4 06/07/2016 0917   CL 106 06/07/2016 0917   CO2 26 06/07/2016 0917   GLUCOSE 157 (H) 06/07/2016 0917   BUN 13 06/07/2016 0917   CREATININE 0.99 06/07/2016 0917   CREATININE 0.70 02/13/2015 1414   CALCIUM 8.9 06/07/2016 0917   CALCIUM 8.4 06/30/2013 0318   PROT 6.7 12/26/2015 0111   ALBUMIN 3.6 12/26/2015 0111   AST 23 12/26/2015 0111   ALT 18 12/26/2015 0111   ALKPHOS 73 12/26/2015 0111   BILITOT 0.7 12/26/2015 0111   GFRNONAA 58 (L)  06/07/2016 0917   GFRNONAA 78 08/26/2013 0921   GFRAA >60 06/07/2016 0917   GFRAA >89 08/26/2013 0921       Component Value Date/Time   WBC 8.3 06/07/2016 0917   WBC 9.4 03/31/2016 1059   WBC 9.1 12/31/2015 1359   HGB 12.0 06/07/2016 0917   HGB 12.0 03/31/2016 1059   HGB 12.4 12/31/2015 1359   HCT 38.2 06/07/2016 0917   HCT 36.6 03/31/2016 1059   HCT 39.2 12/31/2015 1359   MCV 102.4 (H) 06/07/2016 0917   MCV 98.7 03/31/2016 1059   MCV 98.2 12/31/2015 1359    Lipid Panel     Component Value Date/Time   CHOL 77 09/02/2011 0446   TRIG 88 09/02/2011 0446   HDL 35 (L) 09/02/2011 0446   CHOLHDL 2.2 09/02/2011 0446   VLDL 18 09/02/2011 0446   LDLCALC 24 09/02/2011 0446    ABG    Component Value Date/Time   PHART 7.412 01/10/2014 1411   PCO2ART 34.8 (L) 01/10/2014 1411   PO2ART 135.0 (H) 01/10/2014 1411   HCO3 25.6 (H) 01/26/2015 1132   TCO2 27 01/26/2015 1132   ACIDBASEDEF 1.0 01/26/2015 1132   O2SAT 59.0 01/26/2015 1132     Lab Results  Component Value Date   TSH 2.553 12/26/2015   BNP (last 3 results) No results for input(s): BNP in the last 8760 hours.  ProBNP (last 3 results) No results for input(s): PROBNP in the last 8760 hours.  Cardiac Panel (last 3 results) No results for input(s): CKTOTAL, CKMB, TROPONINI, RELINDX in the last 72 hours.  Iron/TIBC/Ferritin/ %Sat    Component Value Date/Time   IRON 42 12/31/2015 1359   TIBC 319 12/31/2015 1359   FERRITIN 112 03/31/2016 1059   IRONPCTSAT 13 12/31/2015 1359   IRONPCTSAT 8 (L)  10/04/2014 1140     EKG Orders placed or performed in visit on 04/15/16  . EKG 12-Lead     Prior Assessment and Plan Problem List as of 07/15/2016 Reviewed: 06/09/2016 12:14 PM by Yolonda Kida, CRNA     Cardiovascular and Mediastinum   Essential hypertension   Last Assessment & Plan 08/27/2014 Office Visit Written 08/27/2014 10:52 AM by Dyann Kief, PA-C    Blood pressure is on the low side but no dizziness or  presyncope.      Atrial fibrillation [facility])   Last Assessment & Plan 08/27/2014 Office Visit Written 08/27/2014 10:52 AM by Dyann Kief, PA-C    Patient remains in atrial fibrillation with controlled rate on low-dose metoprolol. She is also on Eliquis 5 mg twice a day. Continue both. Recent nosebleed in January without recurrence.      Congestive heart failure [facility])   Last Assessment & Plan 12/25/2013 Office Visit Written 12/25/2013  3:22 PM by Jodelle Gross, NP    No evidence of fluid overload currently. Blood pressures on the low side. I decreased her Lasix from 40 mg daily to 20 mg lately and she is having some symptoms of dizziness. She is avoiding salty foods and not adding salt to her foods at all. She is continuing to lose weight and is no evidence of edema in the lower extremities. She will have precatheterization labs to evaluate kidney function. On discharge her kidney function was normal at 0.78 on 12/22/2013.      HYPOTENSION, ORTHOSTATIC   Last Assessment & Plan 10/26/2012 Office Visit Written 10/27/2012 12:31 PM by Kathlen Brunswick, MD    No orthostatic change in blood pressure at today's visit.      Acute CHF [facility])   Last Assessment & Plan 08/27/2014 Office Visit Written 08/27/2014 10:51 AM by Dyann Kief, PA-C    Patient has 30 pound weight gain with ascites and lower extremity edema. Will attempt outpatient diuresis, but advised patient she may need IV diuresis and admission to the hospital. Patient does not improve in the next day or so she is advised to come to the emergency room.      Atrial fibrillation with RVR (HCC)   Syncope   Acute on chronic combined systolic and diastolic CHF (congestive heart failure) (HCC)   Syncope and collapse   Chronic diastolic CHF (congestive heart failure) (HCC)   Syncope, near     Respiratory   LUNG NODULE   SLEEP APNEA   Epistaxis, recurrent     Digestive   Chronic diarrhea   Last Assessment & Plan 02/05/2016 Office Visit  Written 02/10/2016 10:32 PM by Nira Retort, NP    Continue Bentyl as needed, Imodium prn. 6 month return.       Barrett's esophagus   Last Assessment & Plan 09/16/2014 Office Visit Written 09/21/2014 12:09 AM by Tiffany Kocher, PA-C    Due repeat EGD this year. Patient not ready to schedule at this time. Continue PPI.       Gastroparesis   Last Assessment & Plan 02/05/2016 Office Visit Written 02/10/2016 10:32 PM by Nira Retort, NP    Doing well with scheduled Zofran, Protonix BID. Return in 6 months.       Irritable bowel syndrome   Last Assessment & Plan 12/07/2015 Office Visit Written 12/09/2015 11:32 AM by Nira Retort, NP    Increase Bentyl to 1-2 capsules as needed.       Dysphagia  Last Assessment & Plan 12/07/2015 Office Visit Written 12/09/2015 11:41 AM by Nira Retort, NP    Last EGD in May 2016. Proceed with BPE.         Endocrine   DM type 2 (diabetes mellitus, type 2) (HCC)   Hypothyroidism   DIABETES MELLITUS, TYPE II, ON INSULIN   Last Assessment & Plan 09/01/2011 Clinical Support Written 09/01/2011 10:06 PM by Kathlen Brunswick, MD    Patient reports improvement in diabetic control with recent 40 pound weight loss.  She was congratulated on this achievement.        Nervous and Auditory   Dementia   CARPAL TUNNEL SYNDROME, HX OF     Musculoskeletal and Integument   Derangement of posterior horn of medial meniscus of right knee   Lateral meniscus, anterior horn derangement, right     Genitourinary   UTI (urinary tract infection)     Other   Hyperlipidemia   Last Assessment & Plan 12/26/2012 Office Visit Written 12/26/2012  3:30 PM by Kathlen Brunswick, MD    Extremely low total and LDL cholesterol. Current lipid-lowering therapy is effective and will be continued.      OBESITY   Last Assessment & Plan 09/01/2011 Clinical Support Written 09/01/2011 10:08 PM by Kathlen Brunswick, MD    Patient reports a 40 pound weight loss.  No weight contained in conjunction with today's  visit.      Iron deficiency anemia   Last Assessment & Plan 07/01/2016 Office Visit Written 07/01/2016  8:16 AM by Ellouise Newer, PA-C    Iron deficiency anemia having undergone an extensive GI work-up that was negative including colonoscopy (11/08/2013), EGD (11/21/2014), and camera capsule study.  Now on ferrex forte and IV iron replacement when indicated.  Labs today: CBC diff, iron/TIBC, ferritin.  I personally reviewed and went over laboratory results with the patient.  The results are noted within this dictation.  She is due for mammogram in ~1 month.  Order is placed for screening mammogram.  Right knee arthroscopy by Dr. Romeo Apple on 06/09/2016 for a right medial and lateral meniscal tears.  Labs in 3 months: CBC, BMET, ferritin  Labs in 6 months: CBC diff, BMET, iron/TIBC, ferritin  Return in 6 months for follow-up      LOW BACK PAIN, CHRONIC   CHEST PAIN-PRECORDIAL   Last Assessment & Plan 09/01/2011 Clinical Support Written 09/03/2011  3:53 PM by Kathlen Brunswick, MD    No recent chest discomfort.      Chronic anticoagulation   Last Assessment & Plan 10/26/2012 Office Visit Written 10/27/2012 12:30 PM by Kathlen Brunswick, MD    Mild anemia persists, likely do to chronic disease although previously characterized as iron deficiency. FOBT will be repeated.      Tremor   Last Assessment & Plan 12/26/2012 Office Visit Written 12/26/2012  3:31 PM by Kathlen Brunswick, MD    Tardive dyskinesia is more prominent than I have noted in the past. Metoclopramide may be contributing. Patient is scheduled for reassessment by Dr. Gerilyn Pilgrim.      Dyspnea   Last Assessment & Plan 01/22/2016 Office Visit Written 01/24/2016  6:16 AM by Nyoka Cowden, MD    Placed on amiodarone 01/29/15  04/10/15  FEV1  1.49 (60%) ratio 88 and dlco 53 corrects to 89% at amiodarone 400 04/21/2015  Walked RA x 2 laps @ 185 ft each stopped due to  Dizzy and legs gave out, nl pace,  no desat  - PFT's  07/22/2015  FEV1  1.76 (71 % ) ratio 86  p 2 % improvement from saba with  VC 2.02(62%) DLCO  56 % corrects to 93 % for alv volume  - PFT's  01/22/2016  FEV1 1.63 (66 % ) ratio 90  p 16 % improvement from saba p nothing prior to study with VC 1.84(57%)  DLCO  54/53 % corrects to 89 % for alv volume     There has been no significant change in ex tol or pfts vs previous with the caveat that her ex is now more limited by back that breathing and as a result she's really no pushing herself as much in terms of ventilatory demand.  So there has been no convincing evidence of amiodarone toxicity. Patients typically have been on amiodarone for 6-12 months before this complication manifests.  Of note, serial clinical evaluation for symptoms such as cough dyspnea or fevers is  the preferred method of monitoring for pulmonary toxicity because a decrease in DLCO or lung volumes is a nonspecific for toxicity. Pathologically amiodarone pulmonary toxicity may appear as interstitial pneumonitis, eosinophilic pneumonia, organizing pneumonia, pulmonary fibrosis or less commonly as diffuse alveolar hemorrhage, pulmonary nodules or pleural effusions.  Risk factors for pulmonary toxicity include age greater than 60, daily dose greater than equal to 400 mg, a high cumulative dose, or pre-existing lung disease    She has 2/4 risk factors  Discussed in detail all the  indications, usual  risks and alternatives  relative to the benefits with patient who agrees to proceed with conservative f/u as outlined with f/u here q 6 m unless new symptoms or amiodarone is d/cd  I had an extended discussion with the patient reviewing all relevant studies completed to date and  lasting 15 to 20 minutes of a 25 minute visit    Each maintenance medication was reviewed in detail including most importantly the difference between maintenance and prns and under what circumstances the prns are to be triggered using an action plan format that is not reflected in the  computer generated alphabetically organized AVS.    Please see instructions for details which were reviewed in writing and the patient given a copy highlighting the part that I personally wrote and discussed at today's ov.         Diarrhea   Abnormal weight loss   Last Assessment & Plan 03/31/2014 Office Visit Edited 04/01/2014  4:51 PM by Tiffany Kocher, PA-C    Continued weight loss, over 60 pounds at this point. Ongoing anorexia, vomiting, chronic abdominal pain, diarrhea. History of chronic iron deficiency anemia although her hemoglobin looks good at this time. Dietary recall reveals patient is consuming limited fluid and solid food concerning to her weight loss. Extensive workup as outlined above. Not clear if gastroparesis is the primary culprit for her anorexia weight loss, consider mesenteric ischemia as well. Previously advised to try pancreatic enzymes but she really has not pursued this option. Has developed tardive dyskinesia on Reglan. Discussed at length with family/patient and they would like to try coming off the medication.  1. Take Zofran Three times per day before meals. 2. Take Marinol twice per day before meals. 3. Take Zenpep 2 with meals and 1 with snacks. Samples provided. If they help your diarrhea, call for samples. 4. Stop Reglan due to abnormal tongue, mouth motions. 5. Please try to sip on liquids all day to make your urine light yellow. You need to  attempt to eat a couple of Boost or Ensure daily. Eat small snacks every 2 to 3 hours. You will continue to lose weight if you don't try to eat.  I will discuss with Dr. Darrick Penna. Further recommendations to follow.        Lower abdominal pain   Anorexia nervosa   Encounter for therapeutic drug monitoring   Chest pain at rest   Obesity   Chest pain   Protein-calorie malnutrition, severe (HCC)   Anemia   Last Assessment & Plan 09/16/2014 Office Visit Written 09/21/2014 12:23 AM by Tiffany Kocher, PA-C    Labs planned  for next week. Change in anemia in part due to recurrent epistaxis. Last TCS and small givens capsule in 2015. Last EGD 2013. Await labs.       Ascites   Last Assessment & Plan 08/27/2014 Office Visit Written 08/27/2014 10:48 AM by Dyann Kief, PA-C    Patient has significant ascites and lower extremity edema with 30 pound weight gain. I discussed possible hospitalization with her but she would like to try outpatient treatment first. We will begin Lasix 40 mg twice a day increase her potassium to 20 mEq 4 times a day. If she does not improve in the next 2 days she is advised to go to the emergency room to be admitted for IV diuresis. 2 g sodium diet. Follow-up with Dr. Wyline Mood next week. We are trying to obtain blood work that was performed on her yesterday.      Diffuse abdominal pain   Last Assessment & Plan 09/16/2014 Office Visit Written 09/21/2014 12:13 AM by Tiffany Kocher, PA-C    New abd pain for four weeks associated with new-onset ascites of unclear etiology, ?related to cardiac etiology. Hepatomegaly noted on recent CT. Abdominal pain worse with BM. To discuss further with Dr. Darrick Penna with regards to pain, new-onset ascites. Further recommendations to follow.       Low hemoglobin   Dehydration   IDA (iron deficiency anemia)   Fall at home       Imaging: No results found.

## 2016-07-18 ENCOUNTER — Other Ambulatory Visit (HOSPITAL_COMMUNITY): Payer: Self-pay | Admitting: Oncology

## 2016-07-18 ENCOUNTER — Other Ambulatory Visit: Payer: Self-pay | Admitting: Internal Medicine

## 2016-07-18 DIAGNOSIS — D509 Iron deficiency anemia, unspecified: Secondary | ICD-10-CM

## 2016-07-19 NOTE — Telephone Encounter (Signed)
Please let pharmacy know that we are not the ones that are prescribing Eliquis for patient.

## 2016-07-19 NOTE — Telephone Encounter (Signed)
Called the pharmacist and informed Bonita Quin.

## 2016-07-22 ENCOUNTER — Ambulatory Visit (HOSPITAL_COMMUNITY): Payer: Medicare Other | Admitting: Oncology

## 2016-07-22 NOTE — Assessment & Plan Note (Signed)
Iron deficiency anemia having undergone an extensive GI work-up that was negative including colonoscopy (11/08/2013), EGD (11/21/2014), and camera capsule study.  Now on ferrex forte and IV iron replacement when indicated.  Labs today: CBC diff, iron/TIBC, ferritin.  I personally reviewed and went over laboratory results with the patient.  The results are noted within this dictation.  She is due for mammogram in Jan 2018.  Order is placed for screening mammogram.  Right knee arthroscopy by Dr. Romeo Apple on 06/09/2016 for a right medial and lateral meniscal tears.  Labs in 3 months: CBC, BMET, ferritin  Labs in 6 months: CBC diff, BMET, iron/TIBC, ferritin  Return in 6 months for follow-up

## 2016-07-22 NOTE — Progress Notes (Unsigned)
Tara Obey, MD [address] [place] [address]  No diagnosis found.  CURRENT THERAPY: Ferrex Forte daily.  IV iron replacement when needed.  INTERVAL HISTORY: [name] 66 y.o. female returns for followup of iron deficiency anemia after an extensive GI work-up that is negative including colonoscopy (11/08/2013), EGD (11/21/2014), and camera capsule study.  She is chronically anticoagulated for A-fib (managed by cardiology).  She recently underwent a right knee arthroscopy by Dr. Romeo Apple on 06/09/2016 for a right medial and lateral meniscal tears.  She has seen cardiology on 07/15/2016 and she is doing well from their standpoint.    ROS  Past Medical History:  Diagnosis Date  . Anemia    H/H of 10/30 with a normal MCV in 12/09  . Anxiety   . Arthritis   . Barrett's esophagus    Diagnosed 1995. Last EGD 2016-NO BARRETT'S.   . Chest pain    Negative cardiac catheterization in 2002; negative stress nuclear study in 2008  . Chronic anticoagulation   . Chronic diastolic CHF (congestive heart failure) (HCC)    a. EF predominantly normal during prior echoes but was 40% during 10/2014 echo. b. Most recent 01/2015 EF normal, 55-60%.  . Chronic LBP    Surgical intervention in 1996  . Diabetes mellitus, type 2 (HCC)    Insulin therapy; exacerbated by prednisone  . Dysrhythmia    AFib  . Gastroparesis    99% retention 05/2008 on GES  . GERD (gastroesophageal reflux disease)   . Hiatal hernia   . Hyperlipidemia   . Hypertension   . Hypothyroid   . IBS (irritable bowel syndrome)   . Obesity   . OSA on CPAP    had CPAP and cannot tolerate.  . Paroxysmal atrial fibrillation (HCC)   . Pulmonary hypertension 01/2015   a. Predominantly pulmonary venous hypertension but may be component of PAH.  . Seizures (HCC)    last seizure was 2 years ago; on keppra for this; unknown etiology  . Syncope    a. Admitted 05/2009; magnetic resonance imagin/ MRA - negative;  etiology thought to be orthostasis secondary to drugs and dehydration. b. Syncope 02/2015 also felt 2/2 dehydration.    Past Surgical History:  Procedure Laterality Date  . BACK SURGERY  1996  . BIOPSY N/A 11/08/2013   Procedure: BIOPSY  / Tissue sampling / ulcers present in small intestine;  Surgeon: West Bali, MD;  [facility];  Service: Endoscopy;  Laterality: N/A;  . CARDIAC CATHETERIZATION  2002  . CARDIAC CATHETERIZATION N/A 01/26/2015   Procedure: Right Heart Cath;  Surgeon: Laurey Morale, MD;  Location: [facility] INVASIVE CV LAB;  Service: Cardiovascular;  Laterality: N/A;  . CARDIOVASCULAR STRESS TEST  2008   Stress nuclear study  . CARDIOVERSION N/A 03/06/2015   Procedure: CARDIOVERSION;  Surgeon: Laurey Morale, MD;  Location: [facility] ENDOSCOPY;  Service: Cardiovascular;  Laterality: N/A;  . CARPAL TUNNEL RELEASE  1994  . COLONOSCOPY  11/2011   Dr. Darrick Penna: Internal hemorrhoids, mild diverticulosis. Random colon biopsies negative.  . COLONOSCOPY N/A 11/08/2013   SLF: Normal mucosa in the terminal ileum/The colon IS redundant/  Moderate diverticulosis throughout the entire colon. ileum bx benign. colon bx benign  . ESOPHAGOGASTRODUODENOSCOPY  2008   Barrett's without dysplasia. esphagus dilated. antral erosions, h.pylori serologies negative.  . ESOPHAGOGASTRODUODENOSCOPY  11/2011   Dr. Darrick Penna: Barrett's esophagus, mild gastritis, diverticulum in the second portion of the  duodenum repeat EGD 3 years. Small bowel biopsies negative. Gastric biopsy show reactive gastropathy but no H. pylori. Esophageal biopsies consistent with GERD. Next EGD 11/2014  . ESOPHAGOGASTRODUODENOSCOPY N/A 11/21/2014   QAE:SLPN non-erosive gastritis/irregular z-line  . GIVENS CAPSULE STUDY  12/07/2011   Proximal small bowel, rare AVM. Distal small bowel, multiple ulcers noted  . GIVENS CAPSULE STUDY N/A 09/27/2013   Distal small bowel ulcers extending to TI.  Marland Kitchen GIVENS CAPSULE STUDY N/A 10/10/2013   Procedure:  GIVENS CAPSULE STUDY;  Surgeon: West Bali, MD;  [facility]: [facility];  Service: Endoscopy;  Laterality: N/A;  7:30  . KNEE ARTHROSCOPY WITH MEDIAL MENISECTOMY Right 06/09/2016   Procedure: KNEE ARTHROSCOPY WITH MEDIAL MENISECTOMY;  Surgeon: Vickki Hearing, MD;  Location: [facility] OR[facility];  Service: Orthopedics;  Laterality: Right;  medial and lateral menisectomy  . LAMINECTOMY  1995   L4-L5  . LAPAROSCOPIC CHOLECYSTECTOMY  1990s  . LEFT HEART CATHETERIZATION WITH CORONARY ANGIOGRAM  01/10/2014   Procedure: LEFT HEART CATHETERIZATION WITH CORONARY ANGIOGRAM;  Surgeon: Lesleigh Noe, MD;  Location: [facility] CATH LAB;  Service: Cardiovascular;;  . RIGHT HEART CATHETERIZATION N/A 01/10/2014   Procedure: RIGHT HEART CATH;  Surgeon: Lesleigh Noe, MD;  Location: [facility] CATH LAB;  Service: Cardiovascular;  Laterality: N/A;  . TOTAL ABDOMINAL HYSTERECTOMY  1999    Family History  Problem Relation Age of Onset  . Hypertension Mother   . Alzheimer's disease Mother   . Stroke Mother   . Heart attack Mother   . Hypertension Other   . Breast cancer Sister   . Heart disease Neg Hx   . Colon cancer Neg Hx     Social History   Social History  . Marital status: Married    Spouse name: N/A  . Number of children: N/A  . Years of education: N/A   Occupational History  . 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 Smoking status: Former Smoker    Packs/day: 0.25    Years: 15.00    Types: Cigarettes    Start date: 02/26/1966    Quit date: 07/01/1983  . Smokeless tobacco: Never Used     Comment: quit in 1984  . Alcohol use No     Comment: last etoh one year ago, never frequent  . Drug use: No  . Sexual activity: Yes    Birth control/ protection: None, Surgical   Other Topics Concern  . Not on file   Social History Narrative   Sedentary   4 children, "blended family"     PHYSICAL EXAMINATION  ECOG PERFORMANCE STATUS: 1 - Symptomatic but  completely ambulatory  There were no vitals filed for this visit.  GENERAL:alert, no distress, well nourished, well developed, comfortable, cooperative, obese, smiling and unaccompanied in office visit. SKIN: skin color, texture, turgor are normal, no rashes or significant lesions HEAD: Normocephalic, No masses, lesions, tenderness or abnormalities EYES: normal, EOMI, Conjunctiva are pink and non-injected EARS: External ears normal OROPHARYNX:lips, buccal mucosa, and tongue normal and mucous membranes are moist  NECK: supple, trachea midline LYMPH:  not examined BREAST:not examined LUNGS: clear to auscultation  HEART: regular rate & rhythm ABDOMEN:abdomen soft, obese and normal bowel sounds BACK: Back symmetric, no curvature. EXTREMITIES:less then 2 second capillary refill, no joint deformities, effusion, or inflammation, no skin discoloration, no cyanosis  NEURO: alert & oriented x 3 with fluent speech, no focal motor/sensory  deficits, gait normal   LABORATORY DATA: CBC    Component Value Date/Time   WBC 8.3 06/07/2016 0917   RBC 3.73 (L) 06/07/2016 0917   HGB 12.0 06/07/2016 0917   HCT 38.2 06/07/2016 0917   PLT 211 06/07/2016 0917   MCV 102.4 (H) 06/07/2016 0917   MCH 32.2 06/07/2016 0917   MCHC 31.4 06/07/2016 0917   RDW 14.2 06/07/2016 0917   LYMPHSABS 2.8 06/07/2016 0917   MONOABS 0.8 06/07/2016 0917   EOSABS 0.3 06/07/2016 0917   BASOSABS 0.1 06/07/2016 0917      Chemistry      Component Value Date/Time   NA 138 06/07/2016 0917   K 4.4 06/07/2016 0917   CL 106 06/07/2016 0917   CO2 26 06/07/2016 0917   BUN 13 06/07/2016 0917   CREATININE 0.99 06/07/2016 0917   CREATININE 0.70 02/13/2015 1414      Component Value Date/Time   CALCIUM 8.9 06/07/2016 0917   CALCIUM 8.4 06/30/2013 0318   ALKPHOS 73 12/26/2015 0111   AST 23 12/26/2015 0111   ALT 18 12/26/2015 0111   BILITOT 0.7 12/26/2015 0111     Lab Results  Component Value Date   IRON 42 12/31/2015    TIBC 319 12/31/2015   FERRITIN 112 03/31/2016     PENDING LABS:   RADIOGRAPHIC STUDIES:  No results found.   PATHOLOGY:    ASSESSMENT AND PLAN:  No problem-specific Assessment & Plan notes found for this encounter.   ORDERS PLACED FOR THIS ENCOUNTER: No orders of the defined types were placed in this encounter.   MEDICATIONS PRESCRIBED THIS ENCOUNTER: No orders of the defined types were placed in this encounter.   THERAPY PLAN:  We will continue to monitor iron studies and provide IV iron as indicated.  All questions were answered. The patient knows to call the clinic with any problems, questions or concerns. We can certainly see the patient much sooner if necessary.  Patient and plan discussed with Dr. Loma Messing and she is in agreement with the aforementioned.   This note is electronically signed by: Dellis Anes, PA-C 07/22/2016 9:02 AM

## 2016-07-26 ENCOUNTER — Other Ambulatory Visit (HOSPITAL_COMMUNITY): Payer: Self-pay | Admitting: Oncology

## 2016-07-26 DIAGNOSIS — D509 Iron deficiency anemia, unspecified: Secondary | ICD-10-CM

## 2016-07-27 ENCOUNTER — Encounter: Payer: Self-pay | Admitting: Gastroenterology

## 2016-07-27 ENCOUNTER — Ambulatory Visit (INDEPENDENT_AMBULATORY_CARE_PROVIDER_SITE_OTHER): Payer: Medicare Other | Admitting: Gastroenterology

## 2016-07-27 VITALS — BP 97/54 | HR 59 | Temp 98.0°F | Ht 66.0 in | Wt 233.2 lb

## 2016-07-27 DIAGNOSIS — K529 Noninfective gastroenteritis and colitis, unspecified: Secondary | ICD-10-CM | POA: Diagnosis not present

## 2016-07-27 DIAGNOSIS — K3184 Gastroparesis: Secondary | ICD-10-CM

## 2016-07-27 NOTE — Patient Instructions (Signed)
I am glad you are doing so well!  We will see you back in 6 months!

## 2016-07-27 NOTE — Progress Notes (Signed)
Referring Provider: Gareth Morgan, MD Primary Care Physician:  [name], MD Primary GI: Dr. Darrick Penna    Chief Complaint  Patient presents with  . Diarrhea    HPI:   [name] is a 66 y.o. female presenting today with a history of chronic abdominal pain, gastroparesis, IBS-D, history of IDA. EGD in May 2016 due to anemia and history of Barrett's without evidence of Barrett's. Last colonoscopy in May 2015. BPE has noted small to moderate sized sliding type hiatal hernia with spontaneous and inducible reflux, nonspecific esophageal dysmotility. Diarrhea has improved with Bentyl.   Had some type of gastroenteritis last week with persistent N/V. Whole family had issues. Saturday and Sunday able to eat some more.   No diarrhea in the last 3 weeks. Bentyl just as needed. No rectal bleeding. Protonix BID. Zofran with meals. Doing well from a GI standpoint.   Past Medical History:  Diagnosis Date  . Anemia    H/H of 10/30 with a normal MCV in 12/09  . Anxiety   . Arthritis   . Barrett's esophagus    Diagnosed 1995. Last EGD 2016-NO BARRETT'S.   . Chest pain    Negative cardiac catheterization in 2002; negative stress nuclear study in 2008  . Chronic anticoagulation   . Chronic diastolic CHF (congestive heart failure) (HCC)    a. EF predominantly normal during prior echoes but was 40% during 10/2014 echo. b. Most recent 01/2015 EF normal, 55-60%.  . Chronic LBP    Surgical intervention in 1996  . Diabetes mellitus, type 2 (HCC)    Insulin therapy; exacerbated by prednisone  . Dysrhythmia    AFib  . Gastroparesis    99% retention 05/2008 on GES  . GERD (gastroesophageal reflux disease)   . Hiatal hernia   . Hyperlipidemia   . Hypertension   . Hypothyroid   . IBS (irritable bowel syndrome)   . Obesity   . OSA on CPAP    had CPAP and cannot tolerate.  . Paroxysmal atrial fibrillation (HCC)   . Pulmonary hypertension 01/2015   a. Predominantly pulmonary venous  hypertension but may be component of PAH.  . Seizures (HCC)    last seizure was 2 years ago; on keppra for this; unknown etiology  . Syncope    a. Admitted 05/2009; magnetic resonance imagin/ MRA - negative; etiology thought to be orthostasis secondary to drugs and dehydration. b. Syncope 02/2015 also felt 2/2 dehydration.    Past Surgical History:  Procedure Laterality Date  . BACK SURGERY  1996  . BIOPSY N/A 11/08/2013   Procedure: BIOPSY  / Tissue sampling / ulcers present in small intestine;  Surgeon: West Bali, MD;  [facility]: [facility];  Service: Endoscopy;  Laterality: N/A;  . CARDIAC CATHETERIZATION  2002  . CARDIAC CATHETERIZATION N/A 01/26/2015   Procedure: Right Heart Cath;  Surgeon: Laurey Morale, MD;  Location: [facility] INVASIVE CV LAB;  Service: Cardiovascular;  Laterality: N/A;  . CARDIOVASCULAR STRESS TEST  2008   Stress nuclear study  . CARDIOVERSION N/A 03/06/2015   Procedure: CARDIOVERSION;  Surgeon: Laurey Morale, MD;  Location: [facility] ENDOSCOPY;  Service: Cardiovascular;  Laterality: N/A;  . CARPAL TUNNEL RELEASE  1994  . COLONOSCOPY  11/2011   Dr. Darrick Penna: Internal hemorrhoids, mild diverticulosis. Random colon biopsies negative.  . COLONOSCOPY N/A 11/08/2013   SLF: Normal mucosa in the terminal ileum/The colon IS redundant/  Moderate diverticulosis throughout the entire colon. ileum bx benign. colon  bx benign  . ESOPHAGOGASTRODUODENOSCOPY  2008   Barrett's without dysplasia. esphagus dilated. antral erosions, h.pylori serologies negative.  . ESOPHAGOGASTRODUODENOSCOPY  11/2011   Dr. Darrick Penna: Barrett's esophagus, mild gastritis, diverticulum in the second portion of the duodenum repeat EGD 3 years. Small bowel biopsies negative. Gastric biopsy show reactive gastropathy but no H. pylori. Esophageal biopsies consistent with GERD. Next EGD 11/2014  . ESOPHAGOGASTRODUODENOSCOPY N/A 11/21/2014   ZHY:QMVH non-erosive gastritis/irregular z-line  . GIVENS CAPSULE STUDY  12/07/2011    Proximal small bowel, rare AVM. Distal small bowel, multiple ulcers noted  . GIVENS CAPSULE STUDY N/A 09/27/2013   Distal small bowel ulcers extending to TI.  Marland Kitchen GIVENS CAPSULE STUDY N/A 10/10/2013   Procedure: GIVENS CAPSULE STUDY;  Surgeon: West Bali, MD;  [facility]: [facility];  Service: Endoscopy;  Laterality: N/A;  7:30  . KNEE ARTHROSCOPY WITH MEDIAL MENISECTOMY Right 06/09/2016   Procedure: KNEE ARTHROSCOPY WITH MEDIAL MENISECTOMY;  Surgeon: Vickki Hearing, MD;  Location: [facility];  Service: Orthopedics;  Laterality: Right;  medial and lateral menisectomy  . LAMINECTOMY  1995   L4-L5  . LAPAROSCOPIC CHOLECYSTECTOMY  1990s  . LEFT HEART CATHETERIZATION WITH CORONARY ANGIOGRAM  01/10/2014   Procedure: LEFT HEART CATHETERIZATION WITH CORONARY ANGIOGRAM;  Surgeon: Lesleigh Noe, MD;  Location: [facility] CATH LAB;  Service: Cardiovascular;;  . RIGHT HEART CATHETERIZATION N/A 01/10/2014   Procedure: RIGHT HEART CATH;  Surgeon: Lesleigh Noe, MD;  Location: [facility] CATH LAB;  Service: Cardiovascular;  Laterality: N/A;  . TOTAL ABDOMINAL HYSTERECTOMY  1999    Current Outpatient Prescriptions  Medication Sig Dispense Refill  . acetaminophen (TYLENOL) 500 MG tablet Take 500 mg by mouth every 6 (six) hours as needed for moderate pain or headache.    Marland Kitchen amiodarone (PACERONE) 200 MG tablet TAKE 1 TABLET BY MOUTH EVERY DAY 30 tablet 3  . apixaban (ELIQUIS) 5 MG TABS tablet Take 1 tablet (5 mg total) by mouth 2 (two) times daily. 60 tablet 0  . dicyclomine (BENTYL) 10 MG capsule TAKE 1 TO 2 CAPSULES BY MOUTH BEFORE MEALS AS NEEDED FOR ABDOMINAL CRAMPING AND LOOSE STOOLS 180 capsule 3  . DULoxetine (CYMBALTA) 30 MG capsule Take 30 mg by mouth 2 (two) times daily.    . famotidine (PEPCID) 20 MG tablet Take 20 mg by mouth at bedtime.     . furosemide (LASIX) 40 MG tablet Take 1 tablet (40 mg total) by mouth daily. (Patient taking differently: Take 20 mg by mouth daily. ) 30 tablet 0  . gabapentin  (NEURONTIN) 300 MG capsule Take 300 mg by mouth 3 (three) times daily.    Marland Kitchen glipiZIDE (GLUCOTROL) 10 MG tablet Take 10 mg by mouth 2 (two) times daily before a meal.     . HYDROcodone-acetaminophen (NORCO/VICODIN) 5-325 MG tablet Take 1 tablet by mouth every 6 (six) hours as needed for moderate pain. 42 tablet 0  . Insulin Glargine (LANTUS SOLOSTAR) 100 UNIT/ML Solostar Pen Inject 20-50 Units into the skin at bedtime as needed. Patient takes 20 units every night at bedtime if over 178.  Takes 50 units if her blood sugar is above 300.    . levETIRAcetam (KEPPRA) 750 MG tablet Take 750 mg by mouth 2 (two) times daily.    Marland Kitchen levothyroxine (SYNTHROID, LEVOTHROID) 137 MCG tablet Take 137 mcg by mouth daily.    Marland Kitchen loperamide (IMODIUM) 2 MG capsule Take 1 capsule (2 mg total) by mouth as needed for diarrhea or loose  stools. 30 capsule 5  . LORazepam (ATIVAN) 1 MG tablet Take 1 mg by mouth 3 (three) times daily as needed for anxiety or sleep. For anxiety    . magnesium oxide (MAG-OX) 400 MG tablet Take 400 mg by mouth daily.     . metaxalone (SKELAXIN) 800 MG tablet Take 800 mg by mouth 3 (three) times daily.     . metFORMIN (GLUCOPHAGE) 500 MG tablet Take 1,000 mg by mouth 2 (two) times daily with a meal.     . Multiple Vitamin (MULTIVITAMIN WITH MINERALS) TABS tablet Take 1 tablet by mouth daily.    . nitroGLYCERIN (NITROSTAT) 0.4 MG SL tablet Place 0.4 mg under the tongue every 5 (five) minutes as needed for chest pain. Reported on 07/08/2015    . ondansetron (ZOFRAN) 4 MG tablet Take 1 tablet (4 mg total) by mouth 4 (four) times daily -  before meals and at bedtime. As needed for nausea. 120 tablet 11  . pantoprazole (PROTONIX) 40 MG tablet Take 1 tablet (40 mg total) by mouth 2 (two) times daily before a meal. 180 tablet 3  . potassium chloride (K-DUR) 10 MEQ tablet TAKE 2 TABLETS BY MOUTH TWICE DAILY FOR LOW POTASSIUM 120 tablet 3  . potassium chloride (K-DUR,KLOR-CON) 10 MEQ tablet TAKE 2 TABLETS BY MOUTH  TWICE DAILY FOR LOW POTASSIUM 120 tablet 3  . promethazine (PHENERGAN) 12.5 MG tablet Take 1 tablet (12.5 mg total) by mouth every 4 (four) hours as needed for nausea or vomiting. 42 tablet 0  . vitamin B-12 (CYANOCOBALAMIN) 500 MCG tablet Take 500 mcg by mouth daily.    Marland Kitchen POLY-IRON 150 FORTE 150-25-1 MG-MCG-MG CAPS TAKE 1 CAPSULE BY MOUTH EVERY DAY (Patient not taking: Reported on 07/27/2016) 30 each 5   No current facility-administered medications for this visit.     Allergies as of 07/27/2016 - Review Complete 07/27/2016  Allergen Reaction Noted  . Ibuprofen Other (See Comments) 10/13/2013  . Codeine Nausea And Vomiting and Other (See Comments)   . Dilaudid [hydromorphone hcl] Other (See Comments) 12/31/2015  . Celexa [citalopram hydrobromide] Other (See Comments) 06/30/2013  . Reglan [metoclopramide] Other (See Comments) 06/04/2014    Family History  Problem Relation Age of Onset  . Hypertension Mother   . Alzheimer's disease Mother   . Stroke Mother   . Heart attack Mother   . Hypertension Other   . Breast cancer Sister   . Heart disease Neg Hx   . Colon cancer Neg Hx     Social History   Social History  . Marital status: Married    Spouse name: N/A  . Number of children: N/A  . Years of education: N/A   Occupational History  . 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 Smoking status: Former Smoker    Packs/day: 0.25    Years: 15.00    Types: Cigarettes    Start date: 02/26/1966    Quit date: 07/01/1983  . Smokeless tobacco: Never Used     Comment: quit in 1984  . Alcohol use No     Comment: last etoh one year ago, never frequent  . Drug use: No  . Sexual activity: Yes    Birth control/ protection: None, Surgical   Other Topics Concern  . None   Social History Narrative   Sedentary   4 children, "blended family"    Review of Systems: As mentioned in HPI  Physical Exam: BP (!) 97/54   Pulse (!) 59    Temp 98 F (36.7 C) (Oral)   Ht 5\' 6"  (1.676 m)   Wt 233 lb 3.2 oz (105.8 kg)   BMI 37.64 kg/m  General:   Alert and oriented. No distress noted. Pleasant and cooperative.  Head:  Normocephalic and atraumatic. Eyes:  Conjuctiva clear without scleral icterus. Mouth:  Oral mucosa pink and moist. Good dentition. No lesions. Abdomen:  +BS, soft, non-tender and non-distended. No rebound or guarding. No HSM or masses noted. Msk:  Symmetrical without gross deformities. Normal posture. Extremities:  Without edema. Neurologic:  Alert and  oriented x4;  grossly normal neurologically.  Psych:  Alert and cooperative. Normal mood and affect.

## 2016-07-29 ENCOUNTER — Ambulatory Visit: Payer: Medicare Other | Admitting: Cardiology

## 2016-07-29 ENCOUNTER — Ambulatory Visit: Payer: Medicare Other | Admitting: Internal Medicine

## 2016-07-31 NOTE — Assessment & Plan Note (Signed)
Doing well with Zofran, Protonix, dietary modification. Return in 6 months.

## 2016-07-31 NOTE — Assessment & Plan Note (Signed)
Bentyl prn. No alarm features. Return in 6 months.

## 2016-08-01 NOTE — Progress Notes (Signed)
CC'D TO PCP °

## 2016-08-08 ENCOUNTER — Ambulatory Visit (INDEPENDENT_AMBULATORY_CARE_PROVIDER_SITE_OTHER): Payer: Self-pay | Admitting: Orthopedic Surgery

## 2016-08-08 ENCOUNTER — Encounter: Payer: Self-pay | Admitting: Orthopedic Surgery

## 2016-08-08 DIAGNOSIS — Z4889 Encounter for other specified surgical aftercare: Secondary | ICD-10-CM

## 2016-08-08 DIAGNOSIS — Z9889 Other specified postprocedural states: Secondary | ICD-10-CM

## 2016-08-08 NOTE — Progress Notes (Signed)
Patient ID: [name], female   DOB: July 02, 1951, 66 y.o.   MRN: [id number]  Follow up visit/  right knee arthroscopy  Chief Complaint  Patient presents with  . Follow-up    SARK, 06/09/16    Much improved after physical therapy now walking with a cane. Patient says she's gotten much better in terms of balance and strengthening would like to continue therapy for 4 more weeks if possible  So we will go ahead and order that she will see [place] on an as-needed basis.   Encounter Diagnoses  Name Primary?  Marland Kitchen Aftercare following surgery Yes  . S/P right knee arthroscopy       1:49 PM Fuller Canada, MD 08/08/2016

## 2016-08-08 NOTE — Addendum Note (Signed)
Addended by: Adella Hare B on: 08/08/2016 01:54 PM   Modules accepted: Orders

## 2016-08-08 NOTE — Patient Instructions (Signed)
Therapy x 4 weeks

## 2016-08-10 ENCOUNTER — Telehealth: Payer: Self-pay | Admitting: Cardiology

## 2016-08-10 NOTE — Telephone Encounter (Addendum)
Instructions given to daughterAngelique Blonder)  who voiced understanding.

## 2016-08-10 NOTE — Telephone Encounter (Signed)
Will forward to Dr. Branch. 

## 2016-08-10 NOTE — Telephone Encounter (Signed)
Hold eliquis 2 days prior to surgery, restart day after  Dominga Ferry MD

## 2016-08-10 NOTE — Telephone Encounter (Signed)
Called patient. No answer. Left message to call back.  

## 2016-08-10 NOTE — Telephone Encounter (Signed)
Patient may have teeth extraction on Friday. Will she need to be off of Eliquis prior to. / tg

## 2016-08-16 ENCOUNTER — Other Ambulatory Visit (HOSPITAL_COMMUNITY): Payer: Self-pay | Admitting: Oncology

## 2016-08-16 ENCOUNTER — Other Ambulatory Visit: Payer: Self-pay | Admitting: Gastroenterology

## 2016-08-16 ENCOUNTER — Encounter (HOSPITAL_COMMUNITY): Payer: Medicare Other

## 2016-08-16 ENCOUNTER — Encounter (HOSPITAL_COMMUNITY): Payer: Medicare Other | Attending: Oncology | Admitting: Adult Health

## 2016-08-16 ENCOUNTER — Ambulatory Visit (HOSPITAL_COMMUNITY): Payer: Medicare Other | Admitting: Oncology

## 2016-08-16 VITALS — BP 115/67 | HR 63 | Resp 20 | Ht 66.0 in | Wt 237.2 lb

## 2016-08-16 DIAGNOSIS — D509 Iron deficiency anemia, unspecified: Secondary | ICD-10-CM | POA: Diagnosis present

## 2016-08-16 DIAGNOSIS — J399 Disease of upper respiratory tract, unspecified: Secondary | ICD-10-CM | POA: Diagnosis not present

## 2016-08-16 LAB — BASIC METABOLIC PANEL
Anion gap: 7 (ref 5–15)
BUN: 15 mg/dL (ref 6–20)
CO2: 27 mmol/L (ref 22–32)
Calcium: 9.1 mg/dL (ref 8.9–10.3)
Chloride: 103 mmol/L (ref 101–111)
Creatinine, Ser: 1.22 mg/dL — ABNORMAL HIGH (ref 0.44–1.00)
GFR calc Af Amer: 52 mL/min — ABNORMAL LOW (ref >60)
GFR, EST NON AFRICAN AMERICAN: 45 mL/min — AB (ref >60)
GLUCOSE: 275 mg/dL — AB (ref 65–99)
POTASSIUM: 4.6 mmol/L (ref 3.5–5.1)
Sodium: 137 mmol/L (ref 135–145)

## 2016-08-16 LAB — CBC
HCT: 37.9 % (ref 36.0–46.0)
HEMOGLOBIN: 12.2 g/dL (ref 12.0–15.0)
MCH: 32.1 pg (ref 26.0–34.0)
MCHC: 32.2 g/dL (ref 30.0–36.0)
MCV: 99.7 fL (ref 78.0–100.0)
PLATELETS: 212 10*3/uL (ref 150–400)
RBC: 3.8 MIL/uL — ABNORMAL LOW (ref 3.87–5.11)
RDW: 14.7 % (ref 11.5–15.5)
WBC: 7.5 10*3/uL (ref 4.0–10.5)

## 2016-08-16 LAB — FERRITIN: FERRITIN: 105 ng/mL (ref 11–307)

## 2016-08-16 NOTE — Progress Notes (Signed)
[facility] [address], [place] [address]   CLINIC:  Medical Oncology/Hematology  PCP:  [name], MD [address] [place] [address] [phone]   REASON FOR VISIT:  Follow-up for iron-deficiency anemia   CURRENT THERAPY: IV iron prn    HISTORY OF PRESENT ILLNESS:  (From Jenita Seashore, PA-C's last note on 12/31/15)   Previous IV iron administration:  Oncology Flowsheet 01/02/2015  ferumoxytol [facility]) IV 510 mg   Oncology Flowsheet 03/23/2015 03/30/2015 07/10/2015 07/17/2015  ferric gluconate (NULECIT) IV 125 mg 125 mg 125 mg 125 mg   Oncology Flowsheet 01/19/2016  ferumoxytol (FERAHEME) IV 510 mg    INTERVAL HISTORY:  [name] presents for follow-up for iron-deficiency anemia; [name] has not had lab work yet today.   [name] stopped her Ferrex forte d/t intolerable side effects including diarrhea & abdominal pain. [name] last saw Leota Sauers, NP/Dr. Kendell Bane with GI in late 07/2016; [name] was told at that visit that [name] would be due for repeat scope evaluation in 01/2017.    [name] has been struggling with an upper respiratory infection for the past 4-5 days; symptoms include productive cough with gray sputum, sore throat, fatigue/weakness, and "sweats." Denies any fevers.  [name] is taking OTC Sudafed, which is helping her symptoms some. Her husband and daughter were recently ill with similar symptoms.    Prior to her current respiratory illness, [name] reports feeling very well. Energy levels and appetite have been great.  Denies any blood in her stool/melena, hematuria, vaginal bleeding, nosebleeds, or oral bleeding.   [name] reports [name] had her mammogram in 04/2016, which was negative. [name] has been participating in PT for her right knee to improve mobility.     REVIEW OF SYSTEMS:  Review of Systems  Constitutional: Positive for diaphoresis and fatigue. Negative for appetite change and fever.  HENT:   Positive for sore throat.   Respiratory: Positive for  cough.   Cardiovascular: Negative.  Negative for chest pain and palpitations.  Gastrointestinal: Negative.  Negative for abdominal pain, blood in stool, constipation, diarrhea, nausea and vomiting.  Genitourinary: Negative.  Negative for hematuria and vaginal bleeding.   Musculoskeletal: Negative.   Skin: Negative.   Neurological: Negative.   Hematological: Negative.  Does not bruise/bleed easily.  Psychiatric/Behavioral: Negative.      PAST MEDICAL/SURGICAL HISTORY:  Past Medical History:  Diagnosis Date  . Anemia    H/H of 10/30 with a normal MCV in 12/09  . Anxiety   . Arthritis   . Barrett's esophagus    Diagnosed 1995. Last EGD 2016-NO BARRETT'S.   . Chest pain    Negative cardiac catheterization in 2002; negative stress nuclear study in 2008  . Chronic anticoagulation   . Chronic diastolic CHF (congestive heart failure) (HCC)    a. EF predominantly normal during prior echoes but was 40% during 10/2014 echo. b. Most recent 01/2015 EF normal, 55-60%.  . Chronic LBP    Surgical intervention in 1996  . Diabetes mellitus, type 2 (HCC)    Insulin therapy; exacerbated by prednisone  . Dysrhythmia    AFib  . Gastroparesis    99% retention 05/2008 on GES  . GERD (gastroesophageal reflux disease)   . Hiatal hernia   . Hyperlipidemia   . Hypertension   . Hypothyroid   . IBS (irritable bowel syndrome)   . Obesity   . OSA on CPAP    had CPAP and cannot tolerate.  . Paroxysmal atrial fibrillation (  HCC)   . Pulmonary hypertension 01/2015   a. Predominantly pulmonary venous hypertension but may be component of PAH.  . Seizures (HCC)    last seizure was 2 years ago; on keppra for this; unknown etiology  . Syncope    a. Admitted 05/2009; magnetic resonance imagin/ MRA - negative; etiology thought to be orthostasis secondary to drugs and dehydration. b. Syncope 02/2015 also felt 2/2 dehydration.   Past Surgical History:  Procedure Laterality Date  . BACK SURGERY  1996  . BIOPSY  N/A 11/08/2013   Procedure: BIOPSY  / Tissue sampling / ulcers present in small intestine;  Surgeon: West Bali, MD;  [facility];  Service: Endoscopy;  Laterality: N/A;  . CARDIAC CATHETERIZATION  2002  . CARDIAC CATHETERIZATION N/A 01/26/2015   Procedure: Right Heart Cath;  Surgeon: Laurey Morale, MD;  Location: [facility] INVASIVE CV LAB;  Service: Cardiovascular;  Laterality: N/A;  . CARDIOVASCULAR STRESS TEST  2008   Stress nuclear study  . CARDIOVERSION N/A 03/06/2015   Procedure: CARDIOVERSION;  Surgeon: Laurey Morale, MD;  Location: [facility] ENDOSCOPY;  Service: Cardiovascular;  Laterality: N/A;  . CARPAL TUNNEL RELEASE  1994  . COLONOSCOPY  11/2011   Dr. Darrick Penna: Internal hemorrhoids, mild diverticulosis. Random colon biopsies negative.  . COLONOSCOPY N/A 11/08/2013   SLF: Normal mucosa in the terminal ileum/The colon IS redundant/  Moderate diverticulosis throughout the entire colon. ileum bx benign. colon bx benign  . ESOPHAGOGASTRODUODENOSCOPY  2008   Barrett's without dysplasia. esphagus dilated. antral erosions, h.pylori serologies negative.  . ESOPHAGOGASTRODUODENOSCOPY  11/2011   Dr. Darrick Penna: Barrett's esophagus, mild gastritis, diverticulum in the second portion of the duodenum repeat EGD 3 years. Small bowel biopsies negative. Gastric biopsy show reactive gastropathy but no H. pylori. Esophageal biopsies consistent with GERD. Next EGD 11/2014  . ESOPHAGOGASTRODUODENOSCOPY N/A 11/21/2014   WJX:BJYN non-erosive gastritis/irregular z-line  . GIVENS CAPSULE STUDY  12/07/2011   Proximal small bowel, rare AVM. Distal small bowel, multiple ulcers noted  . GIVENS CAPSULE STUDY N/A 09/27/2013   Distal small bowel ulcers extending to TI.  Marland Kitchen GIVENS CAPSULE STUDY N/A 10/10/2013   Procedure: GIVENS CAPSULE STUDY;  Surgeon: West Bali, MD;  [facility];  Service: Endoscopy;  Laterality: N/A;  7:30  . KNEE ARTHROSCOPY WITH MEDIAL MENISECTOMY Right 06/09/2016   Procedure: KNEE  ARTHROSCOPY WITH MEDIAL MENISECTOMY;  Surgeon: Vickki Hearing, MD;  Location: [facility] ORS;  Service: Orthopedics;  Laterality: Right;  medial and lateral menisectomy  . LAMINECTOMY  1995   L4-L5  . LAPAROSCOPIC CHOLECYSTECTOMY  1990s  . LEFT HEART CATHETERIZATION WITH CORONARY ANGIOGRAM  01/10/2014   Procedure: LEFT HEART CATHETERIZATION WITH CORONARY ANGIOGRAM;  Surgeon: Lesleigh Noe, MD;  Location: [facility] CATH LAB;  Service: Cardiovascular;;  . RIGHT HEART CATHETERIZATION N/A 01/10/2014   Procedure: RIGHT HEART CATH;  Surgeon: Lesleigh Noe, MD;  Location: [facility] CATH LAB;  Service: Cardiovascular;  Laterality: N/A;  . TOTAL ABDOMINAL HYSTERECTOMY  1999     SOCIAL HISTORY:  Social History   Social History  . Marital status: Married    Spouse name: N/A  . Number of children: N/A  . Years of education: N/A   Occupational History  . 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 Smoking status: Former Smoker    Packs/day: 0.25    Years: 15.00    Types: Cigarettes  Start date: 02/26/1966    Quit date: 07/01/1983  . Smokeless tobacco: Never Used     Comment: quit in 1984  . Alcohol use No     Comment: last etoh one year ago, never frequent  . Drug use: No  . Sexual activity: Yes    Birth control/ protection: None, Surgical   Other Topics Concern  . Not on file   Social History Narrative   Sedentary   4 children, "blended family"    FAMILY HISTORY:  Family History  Problem Relation Age of Onset  . Hypertension Mother   . Alzheimer's disease Mother   . Stroke Mother   . Heart attack Mother   . Hypertension Other   . Breast cancer Sister   . Heart disease Neg Hx   . Colon cancer Neg Hx     CURRENT MEDICATIONS:  Outpatient Encounter Prescriptions as of 08/16/2016  Medication Sig Note  . acetaminophen (TYLENOL) 500 MG tablet Take 500 mg by mouth every 6 (six) hours as needed for moderate pain or headache.   Marland Kitchen amiodarone  (PACERONE) 200 MG tablet TAKE 1 TABLET BY MOUTH EVERY DAY   . apixaban (ELIQUIS) 5 MG TABS tablet Take 1 tablet (5 mg total) by mouth 2 (two) times daily.   Marland Kitchen dicyclomine (BENTYL) 10 MG capsule TAKE 1 TO 2 CAPSULES BY MOUTH BEFORE MEALS AS NEEDED FOR ABDOMINAL CRAMPING AND LOOSE STOOLS   . DULoxetine (CYMBALTA) 30 MG capsule Take 30 mg by mouth 2 (two) times daily.   . famotidine (PEPCID) 20 MG tablet Take 20 mg by mouth at bedtime.    . furosemide (LASIX) 40 MG tablet Take 1 tablet (40 mg total) by mouth daily. (Patient taking differently: Take 20 mg by mouth daily. )   . gabapentin (NEURONTIN) 300 MG capsule Take 300 mg by mouth 3 (three) times daily.   Marland Kitchen glipiZIDE (GLUCOTROL) 10 MG tablet Take 10 mg by mouth 2 (two) times daily before a meal.    . HYDROcodone-acetaminophen (NORCO/VICODIN) 5-325 MG tablet Take 1 tablet by mouth every 6 (six) hours as needed for moderate pain.   . Insulin Glargine (LANTUS SOLOSTAR) 100 UNIT/ML Solostar Pen Inject 20-50 Units into the skin at bedtime as needed. Patient takes 20 units every night at bedtime if over 178.  Takes 50 units if her blood sugar is above 300. 06/09/2016: Took 20 units last night  . levETIRAcetam (KEPPRA) 750 MG tablet Take 750 mg by mouth 2 (two) times daily.   Marland Kitchen levothyroxine (SYNTHROID, LEVOTHROID) 137 MCG tablet Take 137 mcg by mouth daily.   Marland Kitchen loperamide (IMODIUM) 2 MG capsule    . LORazepam (ATIVAN) 1 MG tablet Take 1 mg by mouth 3 (three) times daily as needed for anxiety or sleep. For anxiety 12/25/2015: Normally takes at bedtime  . magnesium oxide (MAG-OX) 400 MG tablet Take 400 mg by mouth daily.    . metaxalone (SKELAXIN) 800 MG tablet Take 800 mg by mouth 3 (three) times daily.    . metFORMIN (GLUCOPHAGE) 500 MG tablet Take 1,000 mg by mouth 2 (two) times daily with a meal.    . Multiple Vitamin (MULTIVITAMIN WITH MINERALS) TABS tablet Take 1 tablet by mouth daily.   . nitroGLYCERIN (NITROSTAT) 0.4 MG SL tablet Place 0.4 mg under  the tongue every 5 (five) minutes as needed for chest pain. Reported on 07/08/2015   . ondansetron (ZOFRAN) 4 MG tablet Take 1 tablet (4 mg total) by mouth 4 (four) times  daily -  before meals and at bedtime. As needed for nausea.   . pantoprazole (PROTONIX) 40 MG tablet Take 1 tablet (40 mg total) by mouth 2 (two) times daily before a meal.   . potassium chloride (K-DUR) 10 MEQ tablet TAKE 2 TABLETS BY MOUTH TWICE DAILY FOR LOW POTASSIUM   . potassium chloride (K-DUR,KLOR-CON) 10 MEQ tablet TAKE 2 TABLETS BY MOUTH TWICE DAILY FOR LOW POTASSIUM   . promethazine (PHENERGAN) 12.5 MG tablet Take 1 tablet (12.5 mg total) by mouth every 4 (four) hours as needed for nausea or vomiting.   . vitamin B-12 (CYANOCOBALAMIN) 500 MCG tablet Take 500 mcg by mouth daily.    No facility-administered encounter medications on file as of 08/16/2016.     ALLERGIES:  Allergies  Allergen Reactions  . Ibuprofen Other (See Comments)    Upsets her stomach and causes abdominal pain  . Codeine Nausea And Vomiting and Other (See Comments)    HALLUCINATIONS  . Dilaudid [Hydromorphone Hcl] Other (See Comments)    Made her pass out  . Celexa [Citalopram Hydrobromide] Other (See Comments)    Dyskinesia  . Reglan [Metoclopramide] Other (See Comments)    DYSKINESIA     PHYSICAL EXAM:  ECOG Performance status: 1 - Symptomatic, but independent.   Vitals:   08/16/16 0900  BP: 115/67  Pulse: 63  Resp: 20  Unable to get temperature per RN  Filed Weights   08/16/16 0900  Weight: 237 lb 3.2 oz (107.6 kg)    Physical Exam  Constitutional: [name] is oriented to person, place, and time and well-developed, well-nourished, and in no distress.  HENT:  Head: Normocephalic.  Mild posterior oropharyngeal erythema secondary to current URI   Eyes: Conjunctivae are normal. Pupils are equal, round, and reactive to light. No scleral icterus.  Neck: Normal range of motion.  Cardiovascular: Normal rate, regular rhythm and normal  heart sounds.   Pulmonary/Chest: Effort normal. No respiratory distress. [name] has wheezes (Expiratory wheezes to bilat bases ). [name] has no rales.  Abdominal: Soft. Bowel sounds are normal. There is no tenderness. There is no guarding.  Musculoskeletal: Normal range of motion. [name] exhibits edema (1+ BLE ankle pitting edema ).  Lymphadenopathy:    [name] has no cervical adenopathy.  Neurological: [name] is alert and oriented to person, place, and time. No cranial nerve deficit. Gait normal.  Skin: Skin is warm and dry. No rash noted.  Psychiatric: Mood, memory, affect and judgment normal.     LABORATORY DATA:  I have reviewed the labs as listed.  CBC    Component Value Date/Time   WBC 8.3 06/07/2016 0917   RBC 3.73 (L) 06/07/2016 0917   HGB 12.0 06/07/2016 0917   HCT 38.2 06/07/2016 0917   PLT 211 06/07/2016 0917   MCV 102.4 (H) 06/07/2016 0917   MCH 32.2 06/07/2016 0917   MCHC 31.4 06/07/2016 0917   RDW 14.2 06/07/2016 0917   LYMPHSABS 2.8 06/07/2016 0917   MONOABS 0.8 06/07/2016 0917   EOSABS 0.3 06/07/2016 0917   BASOSABS 0.1 06/07/2016 0917   CMP Latest Ref Rng & Units 06/07/2016 12/27/2015 12/26/2015  Glucose 65 - 99 mg/dL 161(W) 960(A) 540(J)  BUN 6 - 20 mg/dL 13 81(X) 91(Y)  Creatinine 0.44 - 1.00 mg/dL 7.82 9.56 2.13(Y)  Sodium 135 - 145 mmol/L 138 135 138  Potassium 3.5 - 5.1 mmol/L 4.4 3.2(L) 3.5  Chloride 101 - 111 mmol/L 106 105 108  CO2 22 - 32 mmol/L 26 23 23  Calcium 8.9 - 10.3 mg/dL 8.9 5.4(U) 8.3(L)  Total Protein 6.5 - 8.1 g/dL - - -  Total Bilirubin 0.3 - 1.2 mg/dL - - -  Alkaline Phos 38 - 126 U/L - - -  AST 15 - 41 U/L - - -  ALT 14 - 54 U/L - - -    PENDING LABS:    DIAGNOSTIC IMAGING:  Last EGD: 11/21/14   Last colonoscopy: 11/08/13    PATHOLOGY:  Colonoscopy path: 11/08/13     ASSESSMENT & PLAN:   Iron-deficiency anemia:  -Labs pending for today; will collect CBC with diff, CMET, and anemia panel.  -With regards to her anemia, clinically [name]  is doing very well with no fatigue (prior to current URI).   -[name] could not tolerate oral iron supplementation.  Will consider bringing her in for IV iron as needed to maintain iron > 100.  -Return to cancer center in 6 months with labs.   Upper respiratory illness:  -Likely viral. No evidence of pneumonia on physical exam.  -Encouraged her to report to her PCP or Urgent Care if her symptoms worsen.      Dispo:  -Return to cancer center in 6 months with labs.    All questions were answered to patient's stated satisfaction. Encouraged patient to call with any new concerns or questions before her next visit to the cancer center and we can certain see her sooner, if needed.     Lubertha Basque, NP [facility] [phone]

## 2016-08-16 NOTE — Patient Instructions (Addendum)
[facility] at [facility] Discharge Instructions  RECOMMENDATIONS MADE BY THE CONSULTANT AND ANY TEST RESULTS WILL BE SENT TO YOUR REFERRING PHYSICIAN.  Exam with Lubertha Basque, NP today.   Return to the clinic in 6 months with labs.   Please go to the lab as you leave today.   If your cold symptoms don't improve within the next week, please see your primary care doctor.   Please see Amy as you leave today for appointments.    Thank you for choosing [facility] at [facility] to provide your oncology and hematology care.  To afford each patient quality time with our provider, please arrive at least 15 minutes before your scheduled appointment time.    If you have a lab appointment with the Cancer Center please come in thru the  Main Entrance and check in at the main information desk  You need to re-schedule your appointment should you arrive 10 or more minutes late.  We strive to give you quality time with our providers, and arriving late affects you and other patients whose appointments are after yours.  Also, if you no show three or more times for appointments you may be dismissed from the clinic at the providers discretion.     Again, thank you for choosing [facility].  Our hope is that these requests will decrease the amount of time that you wait before being seen by our physicians.       _____________________________________________________________  Should you have questions after your visit to [facility], please contact our office at [phone] between the hours of 8:30 a.m. and 4:30 p.m.  Voicemails left after 4:30 p.m. will not be returned until the following business day.  For prescription refill requests, have your pharmacy contact our office.       Resources For Cancer Patients and their Caregivers ? American Cancer Society: Can assist with transportation, wigs, general needs, runs Look Good Feel  Better.        (718)092-0287 ? Cancer Care: Provides financial assistance, online support groups, medication/co-pay assistance.  1-800-813-HOPE 912-587-3883) ? [facility] Assists [place] cancer patients and their families through emotional , educational and financial support.  801-633-2069 ? [place] DSS Where to apply for food stamps, Medicaid and utility assistance. (510) 524-4576 ? [facility]: Transportation to medical appointments. (850)778-9972 ? Social Security Administration: May apply for disability if have a Stage IV cancer. 575-695-7137 (878) 412-2170 ? [employer], Disability and Transit Services: Assists with nutrition, care and transit needs. 516-243-5741  Cancer Center Support Programs: @10RELATIVEDAYS @ > Cancer Support Group  2nd Tuesday of the month 1pm-2pm, Journey Room  > Creative Journey  3rd Tuesday of the month 1130am-1pm, Journey Room  > Look Good Feel Better  1st Wednesday of the month 10am-12 noon, Journey Room (Call American Cancer Society to register [phone])

## 2016-08-19 ENCOUNTER — Emergency Department (HOSPITAL_COMMUNITY)
Admission: EM | Admit: 2016-08-19 | Discharge: 2016-08-19 | Disposition: A | Discharge status: Home / Self Care | Payer: Medicare Other | Attending: Emergency Medicine | Admitting: Emergency Medicine

## 2016-08-19 ENCOUNTER — Other Ambulatory Visit: Payer: Self-pay

## 2016-08-19 ENCOUNTER — Emergency Department (HOSPITAL_COMMUNITY): Payer: Medicare Other

## 2016-08-19 ENCOUNTER — Encounter (HOSPITAL_COMMUNITY): Payer: Self-pay | Admitting: Emergency Medicine

## 2016-08-19 DIAGNOSIS — J189 Pneumonia, unspecified organism: Secondary | ICD-10-CM | POA: Diagnosis not present

## 2016-08-19 DIAGNOSIS — Z87891 Personal history of nicotine dependence: Secondary | ICD-10-CM | POA: Insufficient documentation

## 2016-08-19 DIAGNOSIS — Z794 Long term (current) use of insulin: Secondary | ICD-10-CM | POA: Diagnosis not present

## 2016-08-19 DIAGNOSIS — E039 Hypothyroidism, unspecified: Secondary | ICD-10-CM | POA: Diagnosis not present

## 2016-08-19 DIAGNOSIS — I110 Hypertensive heart disease with heart failure: Secondary | ICD-10-CM | POA: Diagnosis not present

## 2016-08-19 DIAGNOSIS — Z79899 Other long term (current) drug therapy: Secondary | ICD-10-CM | POA: Insufficient documentation

## 2016-08-19 DIAGNOSIS — R0602 Shortness of breath: Secondary | ICD-10-CM | POA: Diagnosis present

## 2016-08-19 DIAGNOSIS — I5032 Chronic diastolic (congestive) heart failure: Secondary | ICD-10-CM | POA: Insufficient documentation

## 2016-08-19 DIAGNOSIS — E119 Type 2 diabetes mellitus without complications: Secondary | ICD-10-CM | POA: Insufficient documentation

## 2016-08-19 LAB — CBC WITH DIFFERENTIAL/PLATELET
BASOS ABS: 0.1 10*3/uL (ref 0.0–0.1)
Basophils Relative: 1 %
Eosinophils Absolute: 0.2 10*3/uL (ref 0.0–0.7)
Eosinophils Relative: 2 %
HEMATOCRIT: 37.9 % (ref 36.0–46.0)
HEMOGLOBIN: 12.4 g/dL (ref 12.0–15.0)
LYMPHS ABS: 2.4 10*3/uL (ref 0.7–4.0)
LYMPHS PCT: 29 %
MCH: 32 pg (ref 26.0–34.0)
MCHC: 32.7 g/dL (ref 30.0–36.0)
MCV: 97.7 fL (ref 78.0–100.0)
Monocytes Absolute: 0.9 10*3/uL (ref 0.1–1.0)
Monocytes Relative: 11 %
NEUTROS ABS: 4.7 10*3/uL (ref 1.7–7.7)
NEUTROS PCT: 57 %
PLATELETS: 222 10*3/uL (ref 150–400)
RBC: 3.88 MIL/uL (ref 3.87–5.11)
RDW: 14.6 % (ref 11.5–15.5)
WBC: 8.1 10*3/uL (ref 4.0–10.5)

## 2016-08-19 LAB — BASIC METABOLIC PANEL
ANION GAP: 6 (ref 5–15)
BUN: 13 mg/dL (ref 6–20)
CALCIUM: 8.8 mg/dL — AB (ref 8.9–10.3)
CHLORIDE: 106 mmol/L (ref 101–111)
CO2: 25 mmol/L (ref 22–32)
Creatinine, Ser: 0.9 mg/dL (ref 0.44–1.00)
GFR calc non Af Amer: >60 mL/min (ref >60)
GLUCOSE: 163 mg/dL — AB (ref 65–99)
Potassium: 4.1 mmol/L (ref 3.5–5.1)
Sodium: 137 mmol/L (ref 135–145)

## 2016-08-19 LAB — D-DIMER, QUANTITATIVE: D-Dimer, Quant: 0.48 ug/mL-FEU (ref 0.00–0.50)

## 2016-08-19 LAB — TROPONIN I: TROPONIN I: 0.04 ng/mL — AB (ref <0.03)

## 2016-08-19 LAB — BRAIN NATRIURETIC PEPTIDE: B Natriuretic Peptide: 176 pg/mL — ABNORMAL HIGH (ref 0.0–100.0)

## 2016-08-19 IMAGING — CR DG CHEST 2V
2 series · 2 of 2 positions shown · non-contrast
Comparison: Chest radiograph 01/07/2014

CLINICAL DATA: Patient complains of nausea, weakness, shortness of
breath. History of atrial fibrillation and congestive heart failure.

EXAM:
CHEST  2 VIEW

[view not recorded (1 of 2)]
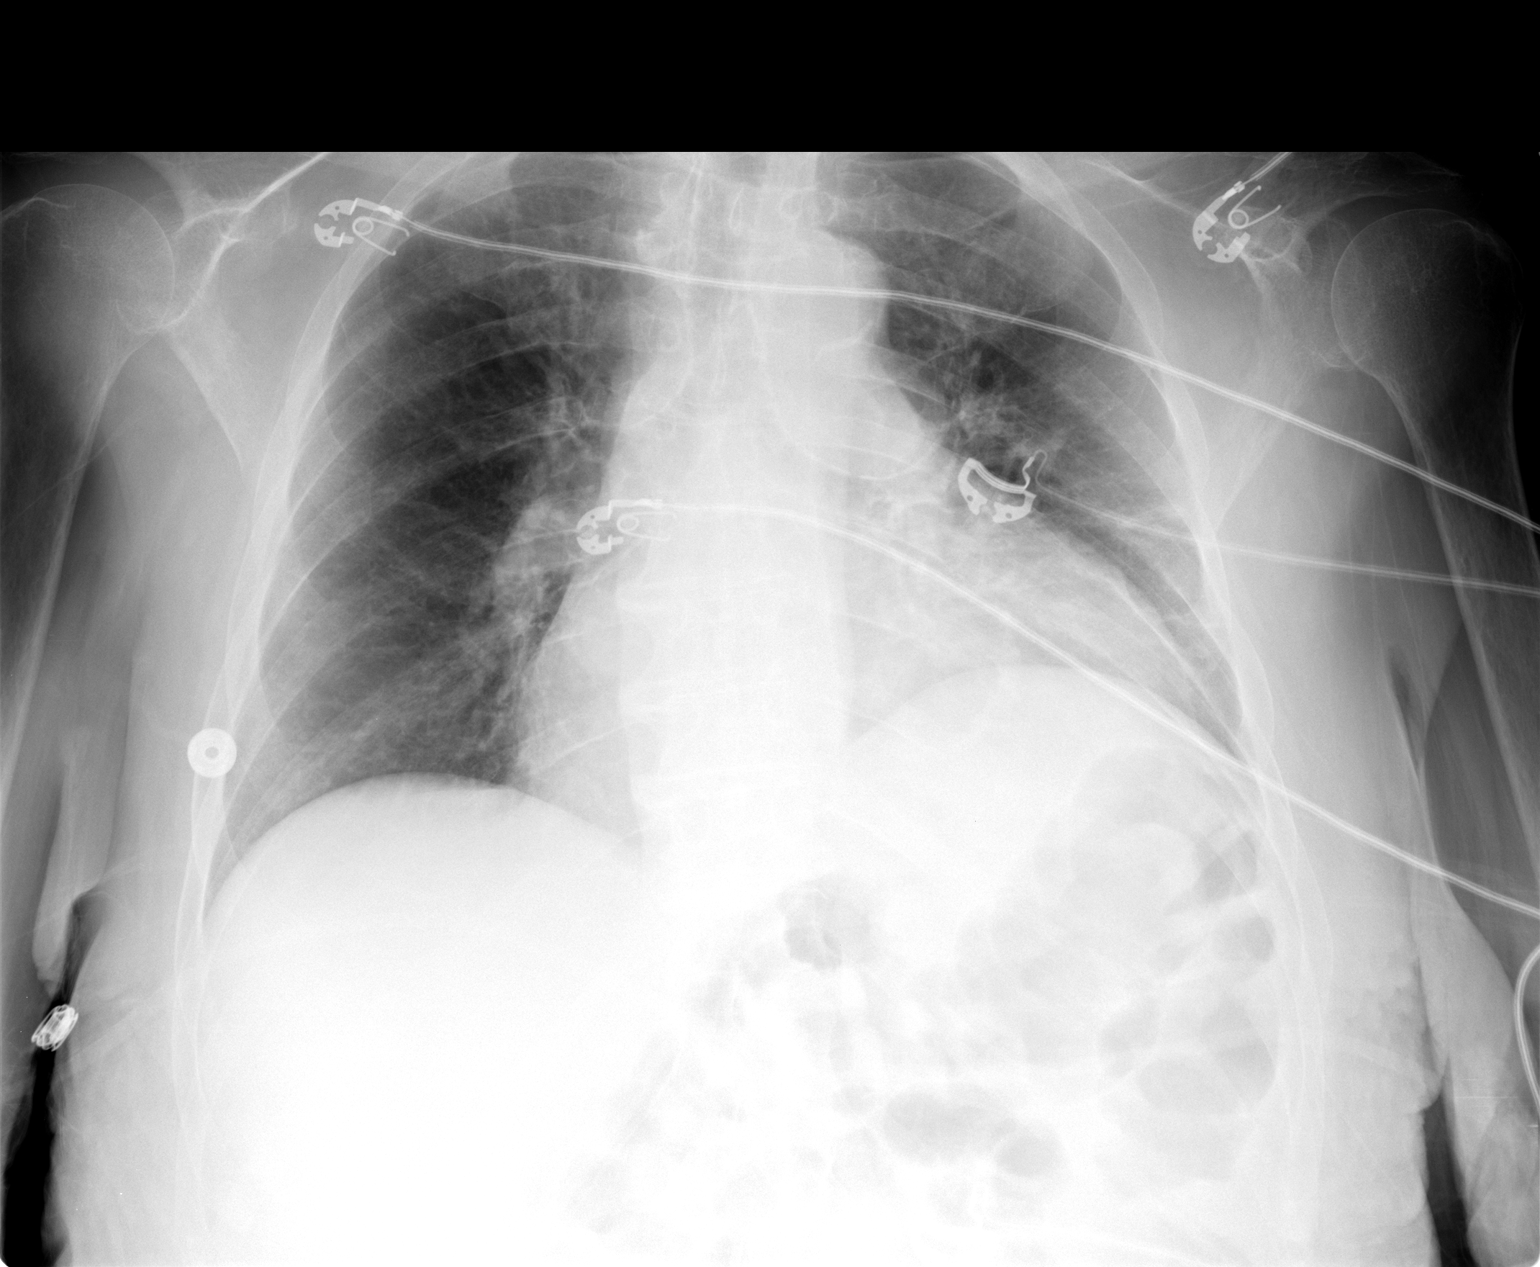

[view not recorded (2 of 2)]
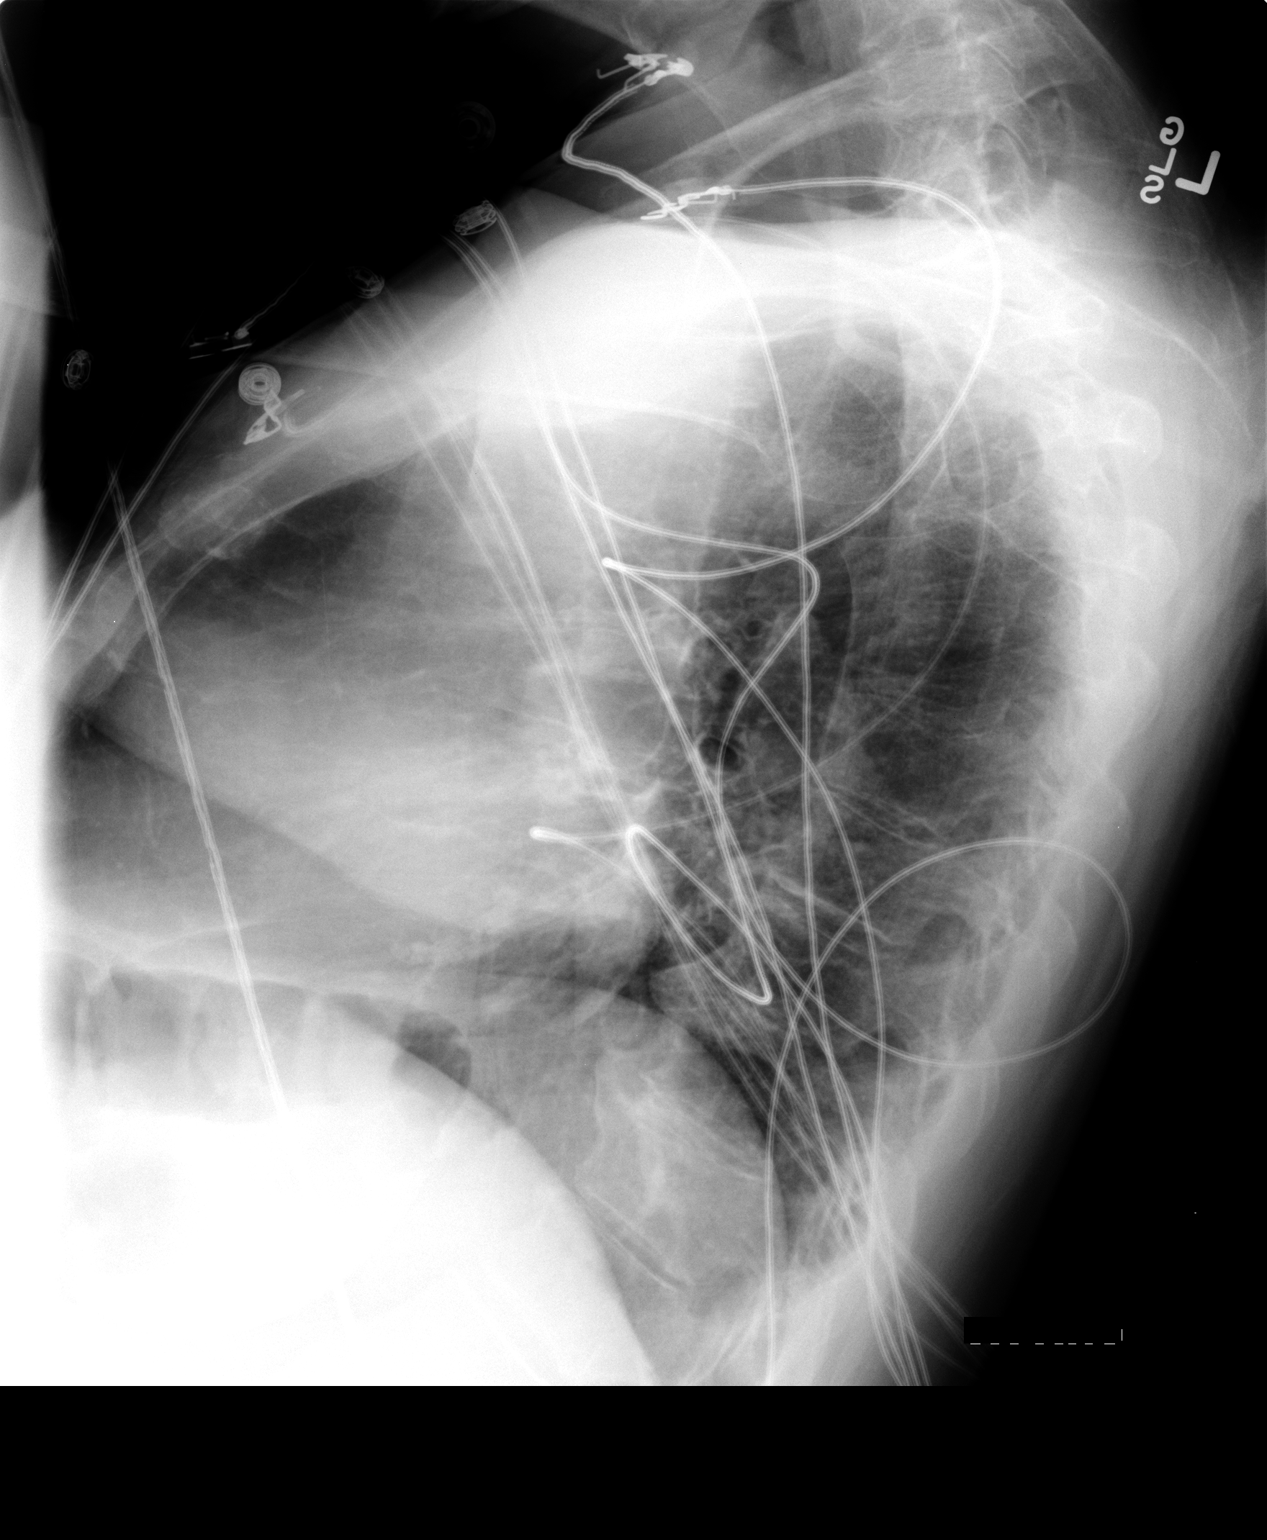

[2 of 2 positions shown; findings below may reference images not displayed]

FINDINGS: Stable cardiomegaly. Elevation of the left hemidiaphragm. Minimal
heterogeneous opacities left lung base. No large consolidative
pulmonary opacity. No pleural effusion or pneumothorax. Lateral view
is limited due to multiple overlying leads and soft tissue.
IMPRESSION: Cardiomegaly. Elevation left hemidiaphragm. Heterogeneous left lung
base opacities likely represent combination of scarring and or
atelectasis.

## 2016-08-19 MED ORDER — DOXYCYCLINE HYCLATE 100 MG PO TABS
100.0000 mg | ORAL_TABLET | Freq: Once | ORAL | Status: AC
  Administered 2016-08-19: 100 mg via ORAL
  Filled 2016-08-19: qty 1

## 2016-08-19 MED ORDER — DOXYCYCLINE HYCLATE 100 MG PO TABS: 100.0000 mg | ORAL_TABLET | Freq: Two times a day (BID) | ORAL | 0 refills | Status: DC

## 2016-08-19 MED ORDER — ALBUTEROL SULFATE HFA 108 (90 BASE) MCG/ACT IN AERS: 2.0000 | INHALATION_SPRAY | RESPIRATORY_TRACT | 0 refills | Status: DC | PRN

## 2016-08-19 NOTE — ED Notes (Signed)
CRITICAL VALUE ALERT  Critical value received:  Troponin/0.04  Date of notification:  08/19/16  Time of notification:  1622  Critical value read back:Yes.    Nurse who received alert:  Dorris Fetch, RN  MD notified (1st page):  Dr Clarene Duke  Time of first page:  1622

## 2016-08-19 NOTE — ED Provider Notes (Signed)
[facility] DEPT Provider Note   CSN: [id number] Arrival date & time: 08/19/16  1456     History   Chief Complaint Chief Complaint  Patient presents with  . Shortness of Breath    HPI [name] is a 66 y.o. female.   Shortness of Breath     Pt was seen at 1630. Per pt, c/o gradual onset and persistence of constant cough and SOB for the past 2 weeks. Pt states she had a 2# weight gain since yesterday. LD lasix yesterday. Pt was about to have PT today (s/p knee arthroscopy 06/09/2016) when she was told her BP was "210/110." Pt does endorse she has been taking sudafed for her sinus congestion this past week. Denies CP/palpitations, no back pain, no abd pain, no N/V/D, no fevers, no rash, no visual changes, no focal motor weakness, no tingling/numbness in extremities.      Past Medical History:  Diagnosis Date  . Anemia    H/H of 10/30 with a normal MCV in 12/09  . Anxiety   . Arthritis   . Barrett's esophagus    Diagnosed 1995. Last EGD 2016-NO BARRETT'S.   . Chest pain    Negative cardiac catheterization in 2002; negative stress nuclear study in 2008  . Chronic anticoagulation   . Chronic diastolic CHF (congestive heart failure) (HCC)    a. EF predominantly normal during prior echoes but was 40% during 10/2014 echo. b. Most recent 01/2015 EF normal, 55-60%.  . Chronic LBP    Surgical intervention in 1996  . Diabetes mellitus, type 2 (HCC)    Insulin therapy; exacerbated by prednisone  . Dysrhythmia    AFib  . Gastroparesis    99% retention 05/2008 on GES  . GERD (gastroesophageal reflux disease)   . Hiatal hernia   . Hyperlipidemia   . Hypertension   . Hypothyroid   . IBS (irritable bowel syndrome)   . Obesity   . OSA on CPAP    had CPAP and cannot tolerate.  . Paroxysmal atrial fibrillation (HCC)   . Pulmonary hypertension 01/2015   a. Predominantly pulmonary venous hypertension but may be component of PAH.  . Seizures (HCC)    last seizure was 2 years  ago; on keppra for this; unknown etiology  . Syncope    a. Admitted 05/2009; magnetic resonance imagin/ MRA - negative; etiology thought to be orthostasis secondary to drugs and dehydration. b. Syncope 02/2015 also felt 2/2 dehydration.    Patient Active Problem List   Diagnosis Date Noted  . Derangement of posterior horn of medial meniscus of right knee   . Lateral meniscus, anterior horn derangement, right   . Syncope, near 12/26/2015  . Fall at home 12/26/2015  . Dysphagia 12/07/2015  . IDA (iron deficiency anemia) 03/13/2015  . Chronic diastolic CHF (congestive heart failure) (HCC) 02/20/2015  . UTI (urinary tract infection) 02/07/2015  . Syncope and collapse 02/05/2015  . Dehydration   . Acute on chronic combined systolic and diastolic CHF (congestive heart failure) (HCC) 01/26/2015  . Low hemoglobin   . Diffuse abdominal pain 09/16/2014  . Ascites 08/27/2014  . Anemia 07/20/2014  . Epistaxis, recurrent 07/20/2014  . Syncope 04/11/2014  . Dementia 04/11/2014  . Chronic diarrhea 04/11/2014  . DM type 2 (diabetes mellitus, type 2) (HCC) 04/11/2014  . Protein-calorie malnutrition, severe (HCC) 04/11/2014  . Acute CHF (HCC) 12/21/2013  . Atrial fibrillation with RVR (HCC) 12/21/2013  . Chest pain at rest 12/03/2013  . Obesity 12/03/2013  .  Chest pain 12/03/2013  . Diarrhea 08/26/2013  . Abnormal weight loss 08/26/2013  . Lower abdominal pain 08/26/2013  . Anorexia nervosa 08/26/2013  . Encounter for therapeutic drug monitoring 08/26/2013  . Dyspnea 06/29/2013  . Tremor 10/27/2012  . Chronic anticoagulation 06/23/2011  . HYPOTENSION, ORTHOSTATIC 08/26/2009  . Hyperlipidemia 03/04/2009  . Barrett's esophagus 09/26/2008  . Gastroparesis 09/26/2008  . Iron deficiency anemia 09/25/2008  . Essential hypertension 09/25/2008  . LUNG NODULE 09/25/2008  . Irritable bowel syndrome 09/25/2008  . SLEEP APNEA 09/25/2008  . CARPAL TUNNEL SYNDROME, HX OF 09/25/2008  .  Hypothyroidism 04/10/2008  . DIABETES MELLITUS, TYPE II, ON INSULIN 04/10/2008  . OBESITY 04/10/2008  . Atrial fibrillation (HCC) 04/10/2008  . Congestive heart failure (HCC) 04/10/2008  . LOW BACK PAIN, CHRONIC 04/10/2008  . CHEST PAIN-PRECORDIAL 04/10/2008    Past Surgical History:  Procedure Laterality Date  . BACK SURGERY  1996  . BIOPSY N/A 11/08/2013   Procedure: BIOPSY  / Tissue sampling / ulcers present in small intestine;  Surgeon: West Bali, MD;  Location: [facility];  Service: Endoscopy;  Laterality: N/A;  . CARDIAC CATHETERIZATION  2002  . CARDIAC CATHETERIZATION N/A 01/26/2015   Procedure: Right Heart Cath;  Surgeon: Laurey Morale, MD;  Location: [facility] INVASIVE CV LAB;  Service: Cardiovascular;  Laterality: N/A;  . CARDIOVASCULAR STRESS TEST  2008   Stress nuclear study  . CARDIOVERSION N/A 03/06/2015   Procedure: CARDIOVERSION;  Surgeon: Laurey Morale, MD;  Location: [facility] ENDOSCOPY;  Service: Cardiovascular;  Laterality: N/A;  . CARPAL TUNNEL RELEASE  1994  . COLONOSCOPY  11/2011   Dr. Darrick Penna: Internal hemorrhoids, mild diverticulosis. Random colon biopsies negative.  . COLONOSCOPY N/A 11/08/2013   SLF: Normal mucosa in the terminal ileum/The colon IS redundant/  Moderate diverticulosis throughout the entire colon. ileum bx benign. colon bx benign  . ESOPHAGOGASTRODUODENOSCOPY  2008   Barrett's without dysplasia. esphagus dilated. antral erosions, h.pylori serologies negative.  . ESOPHAGOGASTRODUODENOSCOPY  11/2011   Dr. Darrick Penna: Barrett's esophagus, mild gastritis, diverticulum in the second portion of the duodenum repeat EGD 3 years. Small bowel biopsies negative. Gastric biopsy show reactive gastropathy but no H. pylori. Esophageal biopsies consistent with GERD. Next EGD 11/2014  . ESOPHAGOGASTRODUODENOSCOPY N/A 11/21/2014   OMB:TDHR non-erosive gastritis/irregular z-line  . GIVENS CAPSULE STUDY  12/07/2011   Proximal small bowel, rare AVM. Distal small bowel, multiple  ulcers noted  . GIVENS CAPSULE STUDY N/A 09/27/2013   Distal small bowel ulcers extending to TI.  Marland Kitchen GIVENS CAPSULE STUDY N/A 10/10/2013   Procedure: GIVENS CAPSULE STUDY;  Surgeon: West Bali, MD;  Location: [facility];  Service: Endoscopy;  Laterality: N/A;  7:30  . KNEE ARTHROSCOPY WITH MEDIAL MENISECTOMY Right 06/09/2016   Procedure: KNEE ARTHROSCOPY WITH MEDIAL MENISECTOMY;  Surgeon: Vickki Hearing, MD;  Location: [facility] ORS;  Service: Orthopedics;  Laterality: Right;  medial and lateral menisectomy  . LAMINECTOMY  1995   L4-L5  . LAPAROSCOPIC CHOLECYSTECTOMY  1990s  . LEFT HEART CATHETERIZATION WITH CORONARY ANGIOGRAM  01/10/2014   Procedure: LEFT HEART CATHETERIZATION WITH CORONARY ANGIOGRAM;  Surgeon: Lesleigh Noe, MD;  Location: [facility] CATH LAB;  Service: Cardiovascular;;  . RIGHT HEART CATHETERIZATION N/A 01/10/2014   Procedure: RIGHT HEART CATH;  Surgeon: Lesleigh Noe, MD;  Location: [facility] CATH LAB;  Service: Cardiovascular;  Laterality: N/A;  . TOTAL ABDOMINAL HYSTERECTOMY  1999    OB History    Gravida Para Term Preterm AB  Living   2 2 2          SAB TAB Ectopic Multiple Live Births                   Home Medications    Prior to Admission medications   Medication Sig Start Date End Date Taking? Authorizing Provider  acetaminophen (TYLENOL) 500 MG tablet Take 500 mg by mouth every 6 (six) hours as needed for moderate pain or headache.   Yes Historical Provider, MD  amiodarone (PACERONE) 200 MG tablet TAKE 1 TABLET BY MOUTH EVERY DAY 05/18/16  Yes Laurey Morale, MD  apixaban (ELIQUIS) 5 MG TABS tablet Take 1 tablet (5 mg total) by mouth 2 (two) times daily. 06/09/16  Yes Vickki Hearing, MD  dicyclomine (BENTYL) 10 MG capsule TAKE 1 TO 2 CAPSULES BY MOUTH BEFORE MEALS AS NEEDED FOR ABDOMINAL CRAMPING AND LOOSE STOOLS 06/17/16  Yes Anice Paganini, NP  DULoxetine (CYMBALTA) 30 MG capsule Take 30 mg by mouth 2 (two) times daily.   Yes Historical Provider, MD  famotidine  (PEPCID) 20 MG tablet Take 20 mg by mouth at bedtime.    Yes Historical Provider, MD  furosemide (LASIX) 40 MG tablet Take 1 tablet (40 mg total) by mouth daily. Patient taking differently: Take 20 mg by mouth daily.  12/28/15  Yes Courage Emokpae, MD  gabapentin (NEURONTIN) 300 MG capsule Take 300 mg by mouth 3 (three) times daily.   Yes Historical Provider, MD  glipiZIDE (GLUCOTROL) 10 MG tablet Take 10 mg by mouth 2 (two) times daily before a meal.    Yes Historical Provider, MD  HYDROcodone-acetaminophen (NORCO/VICODIN) 5-325 MG tablet Take 1 tablet by mouth every 6 (six) hours as needed for moderate pain. 06/09/16  Yes Vickki Hearing, MD  Insulin Glargine (LANTUS SOLOSTAR) 100 UNIT/ML Solostar Pen Inject 20-50 Units into the skin at bedtime as needed. Patient takes 20 units every night at bedtime if over 178.  Takes 50 units if her blood sugar is above 300.   Yes Historical Provider, MD  levETIRAcetam (KEPPRA) 750 MG tablet Take 750 mg by mouth 2 (two) times daily.   Yes Historical Provider, MD  levothyroxine (SYNTHROID, LEVOTHROID) 137 MCG tablet Take 137 mcg by mouth daily. 10/14/14  Yes Historical Provider, MD  loperamide (IMODIUM) 2 MG capsule TAKE 1 CAPSULE BY MOUTH AS NEEDED FOR DIARRHEA OR LOOSE STOOLS 08/18/16  Yes Anice Paganini, NP  LORazepam (ATIVAN) 1 MG tablet Take 1 mg by mouth 3 (three) times daily as needed for anxiety or sleep. For anxiety   Yes Historical Provider, MD  magnesium oxide (MAG-OX) 400 MG tablet Take 400 mg by mouth daily.    Yes Historical Provider, MD  metaxalone (SKELAXIN) 800 MG tablet Take 800 mg by mouth 3 (three) times daily.  10/04/14  Yes Historical Provider, MD  metFORMIN (GLUCOPHAGE) 500 MG tablet Take 1,000 mg by mouth 2 (two) times daily with a meal.    Yes Historical Provider, MD  Multiple Vitamin (MULTIVITAMIN WITH MINERALS) TABS tablet Take 1 tablet by mouth daily.   Yes Historical Provider, MD  nitroGLYCERIN (NITROSTAT) 0.4 MG SL tablet Place 0.4 mg under  the tongue every 5 (five) minutes as needed for chest pain. Reported on 07/08/2015   Yes Historical Provider, MD  ondansetron (ZOFRAN) 4 MG tablet Take 1 tablet (4 mg total) by mouth 4 (four) times daily -  before meals and at bedtime. As needed for nausea. 02/05/16  Yes  Gelene Mink, NP  pantoprazole (PROTONIX) 40 MG tablet Take 1 tablet (40 mg total) by mouth 2 (two) times daily before a meal. 02/05/16  Yes Gelene Mink, NP  potassium chloride (K-DUR) 10 MEQ tablet TAKE 2 TABLETS BY MOUTH TWICE DAILY FOR LOW POTASSIUM 02/18/16  Yes Dolores Patty, MD  promethazine (PHENERGAN) 12.5 MG tablet Take 1 tablet (12.5 mg total) by mouth every 4 (four) hours as needed for nausea or vomiting. 06/09/16  Yes Vickki Hearing, MD  vitamin B-12 (CYANOCOBALAMIN) 500 MCG tablet Take 500 mcg by mouth daily.   Yes Historical Provider, MD    Family History Family History  Problem Relation Age of Onset  . Hypertension Mother   . Alzheimer's disease Mother   . Stroke Mother   . Heart attack Mother   . Hypertension Other   . Breast cancer Sister   . Heart disease Neg Hx   . Colon cancer Neg Hx     Social History Social History  Substance Use Topics  . Smoking status: Former Smoker    Packs/day: 0.25    Years: 15.00    Types: Cigarettes    Start date: 02/26/1966    Quit date: 07/01/1983  . Smokeless tobacco: Never Used     Comment: quit in 1984  . Alcohol use No     Comment: last etoh one year ago, never frequent     Allergies   Ibuprofen; Codeine; Dilaudid [hydromorphone hcl]; Celexa [citalopram hydrobromide]; and Reglan [metoclopramide]   Review of Systems Review of Systems  Respiratory: Positive for shortness of breath.   ROS: Statement: All systems negative except as marked or noted in the HPI; Constitutional: Negative for fever and chills. +weight gain.; ; Eyes: Negative for eye pain, redness and discharge. ; ; ENMT: Negative for ear pain, hoarseness, nasal congestion, sinus pressure and  sore throat. ; ; Cardiovascular: Negative for chest pain, palpitations, diaphoresis, and peripheral edema. ; ; Respiratory: +SOB, cough. Negative for wheezing and stridor. ; ; Gastrointestinal: Negative for nausea, vomiting, diarrhea, abdominal pain, blood in stool, hematemesis, jaundice and rectal bleeding. . ; ; Genitourinary: Negative for dysuria, flank pain and hematuria. ; ; Musculoskeletal: Negative for back pain and neck pain. Negative for swelling and trauma.; ; Skin: Negative for pruritus, rash, abrasions, blisters, bruising and skin lesion.; ; Neuro: Negative for headache, lightheadedness and neck stiffness. Negative for weakness, altered level of consciousness, altered mental status, extremity weakness, paresthesias, involuntary movement, seizure and syncope.      Physical Exam Updated Vital Signs BP 111/82 (BP Location: Left Arm)   Pulse (!) 59   Resp 14   Ht 5\' 6"  (1.676 m)   Wt 237 lb (107.5 kg)   SpO2 93%   BMI 38.25 kg/m    Patient Vitals for the past 24 hrs:  BP Temp Temp src Pulse Resp SpO2 Height Weight  08/19/16 1828 139/75 98 F (36.7 C) Oral 78 20 93 % - -  08/19/16 1800 139/75 - - (!) 58 15 90 % - -  08/19/16 1730 (!) 130/53 - - (!) 59 18 92 % - -  08/19/16 1700 111/82 - - (!) 59 14 93 % - -  08/19/16 1522 - - - - - 95 % - -  08/19/16 1520 - - - - - - 5\' 6"  (1.676 m) 237 lb (107.5 kg)     Physical Exam 1635: Physical examination:  Nursing notes reviewed; Vital signs and O2 SAT reviewed;  Constitutional:  Well developed, Well nourished, Well hydrated, In no acute distress; Head:  Normocephalic, atraumatic; Eyes: EOMI, PERRL, No scleral icterus; ENMT: Mouth and pharynx normal, Mucous membranes moist; Neck: Supple, Full range of motion, No lymphadenopathy; Cardiovascular: Irregular rate and rhythm, No gallop; Respiratory: Breath sounds coarse & equal bilaterally, No wheezes.  Speaking full sentences with ease, Normal respiratory effort/excursion; Chest: Nontender,  Movement normal; Abdomen: Soft, Nontender, Nondistended, Normal bowel sounds; Genitourinary: No CVA tenderness; Extremities: Pulses normal, No tenderness, +tr pedal edema bilat. No calf edema or asymmetry.; Neuro: AA&Ox3, Major CN grossly intact.  Speech clear. No gross focal motor or sensory deficits in extremities.; Skin: Color normal, Warm, Dry.   ED Treatments / Results  Labs (all labs ordered are listed, but only abnormal results are displayed)   EKG  EKG Interpretation  Date/Time:  Friday August 19 2016 17:10:58 EST Ventricular Rate:  63 PR Interval:    QRS Duration: 116 QT Interval:  469 QTC Calculation: 481 R Axis:   -46 Text Interpretation:  Sinus rhythm Left axis deviation   Left anterior fascicular block Probable left ventricular hypertrophy Baseline wander When compared with ECG of 12/26/2015 No significant change was found Confirmed by [facility]  MD, Nicholos Johns [phone]) on 08/19/2016 5:41:39 PM       Radiology   Procedures Procedures (including critical care time)  Medications Ordered in ED Medications - No data to display   Initial Impression / Assessment and Plan / ED Course  I have reviewed the triage vital signs and the nursing notes.  Pertinent labs & imaging results that were available during my care of the patient were reviewed by me and considered in my medical decision making (see chart for details).  MDM Reviewed: previous chart, nursing note and vitals Reviewed previous: labs and ECG Interpretation: labs, ECG and x-ray   Results for orders placed or performed during the hospital encounter of 08/19/16  Basic metabolic panel  Result Value Ref Range   Sodium 137 135 - 145 mmol/L   Potassium 4.1 3.5 - 5.1 mmol/L   Chloride 106 101 - 111 mmol/L   CO2 25 22 - 32 mmol/L   Glucose, Bld 163 (H) 65 - 99 mg/dL   BUN 13 6 - 20 mg/dL   Creatinine, Ser 6.04 0.44 - 1.00 mg/dL   Calcium 8.8 (L) 8.9 - 10.3 mg/dL   GFR calc non Af Amer >60 >60 mL/min   GFR  calc Af Amer >60 >60 mL/min   Anion gap 6 5 - 15  Brain natriuretic peptide  Result Value Ref Range   B Natriuretic Peptide 176.0 (H) 0.0 - 100.0 pg/mL  Troponin I  Result Value Ref Range   Troponin I 0.04 (HH) <0.03 ng/mL  CBC with Differential  Result Value Ref Range   WBC 8.1 4.0 - 10.5 K/uL   RBC 3.88 3.87 - 5.11 MIL/uL   Hemoglobin 12.4 12.0 - 15.0 g/dL   HCT 54.0 98.1 - 19.1 %   MCV 97.7 78.0 - 100.0 fL   MCH 32.0 26.0 - 34.0 pg   MCHC 32.7 30.0 - 36.0 g/dL   RDW 47.8 29.5 - 62.1 %   Platelets 222 150 - 400 K/uL   Neutrophils Relative % 57 %   Neutro Abs 4.7 1.7 - 7.7 K/uL   Lymphocytes Relative 29 %   Lymphs Abs 2.4 0.7 - 4.0 K/uL   Monocytes Relative 11 %   Monocytes Absolute 0.9 0.1 - 1.0 K/uL   Eosinophils Relative 2 %  Eosinophils Absolute 0.2 0.0 - 0.7 K/uL   Basophils Relative 1 %   Basophils Absolute 0.1 0.0 - 0.1 K/uL  D-dimer, quantitative  Result Value Ref Range   D-Dimer, Quant 0.48 0.00 - 0.50 ug/mL-FEU   Dg Chest 2 View Result Date: 08/19/2016 CLINICAL DATA:  Shortness of breath and productive cough for 2 weeks, 4 pound weight gain in 1 week, history CHF, diabetes mellitus, hypertension, atrial fibrillation, GERD, irritable bowel syndrome EXAM: CHEST  2 VIEW COMPARISON:  04/21/2015 FINDINGS: Enlargement of cardiac silhouette. Atherosclerotic calcification aorta. Slight prominent central pulmonary arteries. Increased LEFT lower lobe opacity consistent with pneumonia. Remaining lungs clear. No pleural effusion or pneumothorax. Bones demineralized. IMPRESSION: Enlargement of cardiac silhouette with question pulmonary arterial hypertension. Increased LEFT lower lobe opacity consistent with pneumonia. Aortic atherosclerosis. Electronically Signed   By: Ulyses Southward M.D.   On: 08/19/2016 17:55    Results for JACE, DOWE (MRN [id number]) as of 08/19/2016 19:07  Ref. Range 12/26/2015 01:11 12/26/2015 04:55 12/26/2015 10:25 12/26/2015 16:59 08/19/2016 17:23  Troponin I  Latest Ref Range: <0.03 ng/mL 0.04 (H) 0.05 (H) 0.04 (H) 0.04 (H) 0.04 (HH)   Results for GROVER, WOODFIELD (MRN [id number]) as of 08/19/2016 19:07  Ref. Range 07/20/2014 17:02 01/26/2015 11:49 01/29/2015 04:10 08/19/2016 17:23  B Natriuretic Peptide Latest Ref Range: 0.0 - 100.0 pg/mL 576.0 (H) 566.0 (H) 265.7 (H) 176.0 (H)    1900:   Troponin chronically elevated. BNP lower than previous and CXR without pulmonary edema; doubt CHF exacerbation at this time. Will tx for pneumonia on CXR. Pt has tol PO well while in the ED without N/V. Pt has ambulated with steady gait, easy resps, NAD, Sats 95% R/A. BP stable while in the ED. No clear indication for admission at this time. Dx and testing d/w pt and family.  Questions answered.  Verb understanding, agreeable to d/c home with outpt f/u.    Final Clinical Impressions(s) / ED Diagnoses   Final diagnoses:  None    New Prescriptions New Prescriptions   No medications on file      Samuel Jester, DO 08/22/16 1537

## 2016-08-19 NOTE — ED Triage Notes (Signed)
Reports at rehab knee replacement and had bp was 210/110- Call to Dr Wyline Mood and was sent here for eval- followed also by Dr [name]- Has history of a fib and reports a 4 lb weight gain along with a cough x 1 week

## 2016-08-19 NOTE — ED Notes (Signed)
Ambulated pt pt spo2 was 95 and her pulse was 68

## 2016-08-19 NOTE — ED Notes (Signed)
Pt alert & oriented x4, stable gait. Patient given discharge instructions, paperwork & prescription(s).  Pt left department in wheelchair escorted by staff. Pt left  w/ no further questions.

## 2016-08-19 NOTE — Discharge Instructions (Signed)
Take the prescriptions as directed.  Stop taking sudafed and, if needed, take an over the counter decongestant that will not raise your blood pressure (ask your pharmacist), as directed on packaging, for the next week.  Use over the counter normal saline nasal spray with frequent nose blowing, several times per day for the next 2 weeks.  Call your regular medical doctor on Monday to schedule a follow up appointment in the next 3 days.   Return to the Emergency Department immediately if worsening.

## 2016-08-26 IMAGING — CT CT HEAD W/O CM
1 of 2 series · 15 of 30 positions shown, 19 images · non-contrast
Comparison: CT of the head from 10/04/2012, and MRI/MRA of the
brain performed 11/08/2012

CLINICAL DATA: Acute onset of dizziness and hypotension. Syncope
earlier today. Initial encounter.

EXAM:
CT HEAD WITHOUT CONTRAST
TECHNIQUE: Contiguous axial images were obtained from the base of the skull
through the vertex without intravenous contrast.

[Series 2: headseq 4.8 h37s · axial · 0.43mm/px · z∈[+91,+251]mm · 15 of 36 slices shown, 19 images]
[im 2/36  brain]
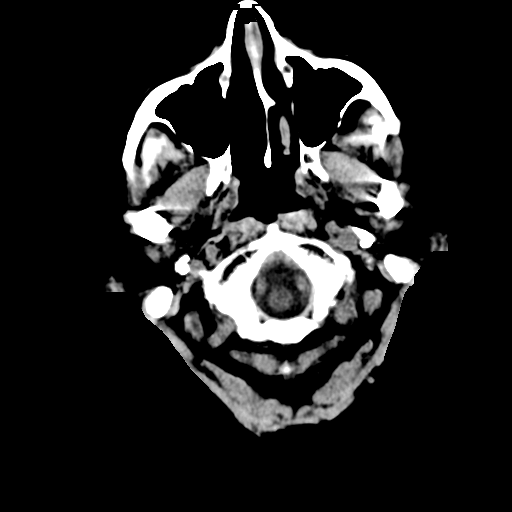
[im 2/36  bone]
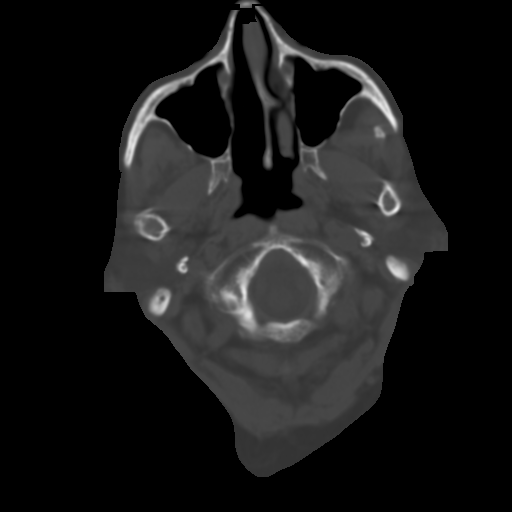
[im 6/36  brain]
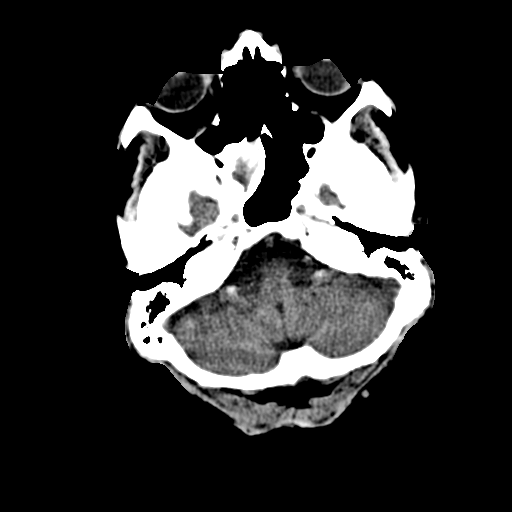
[im 7/36  brain]
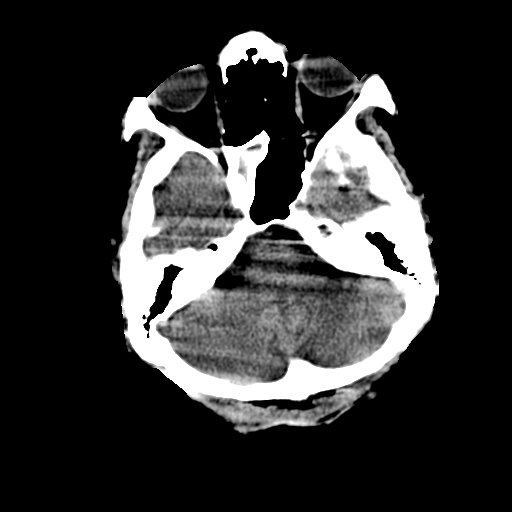
[im 9/36  brain]
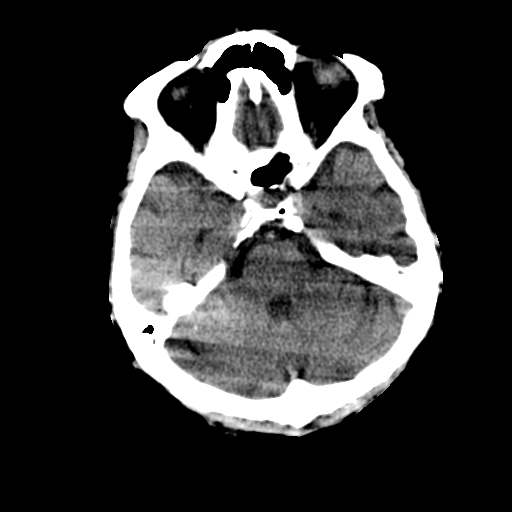
[im 12/36  brain]
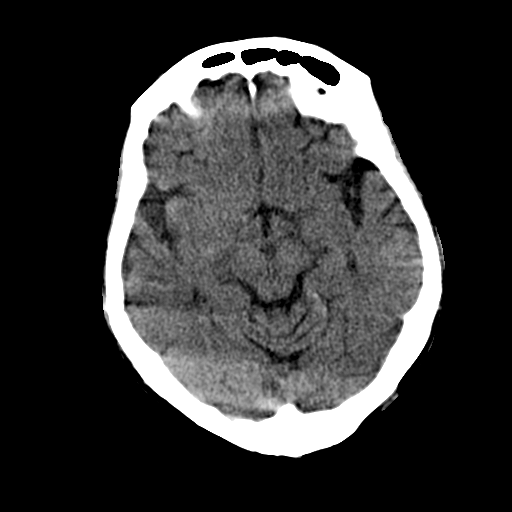
[im 12/36  bone]
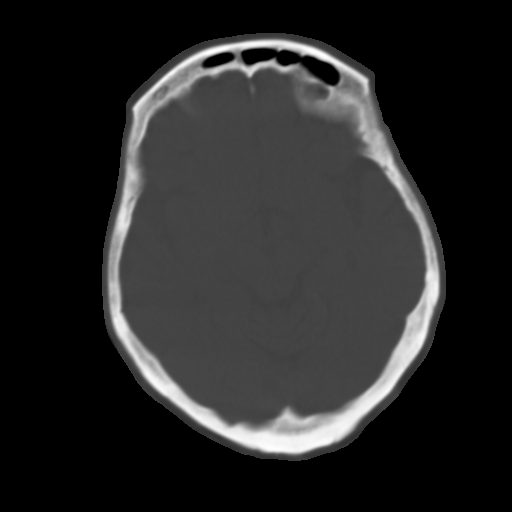
[im 14/36  brain]
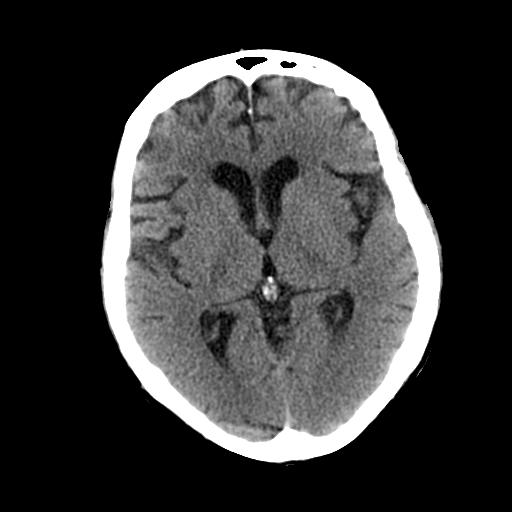
[im 16/36  brain]
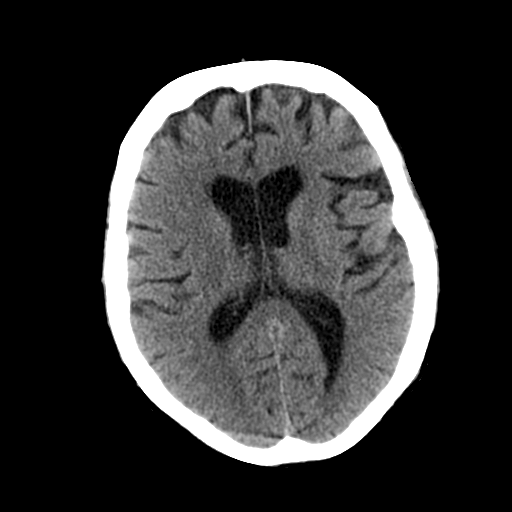
[im 19/36  brain]
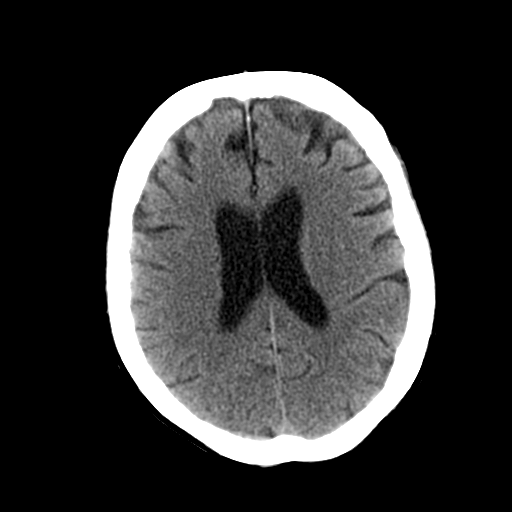
[im 21/36  brain]
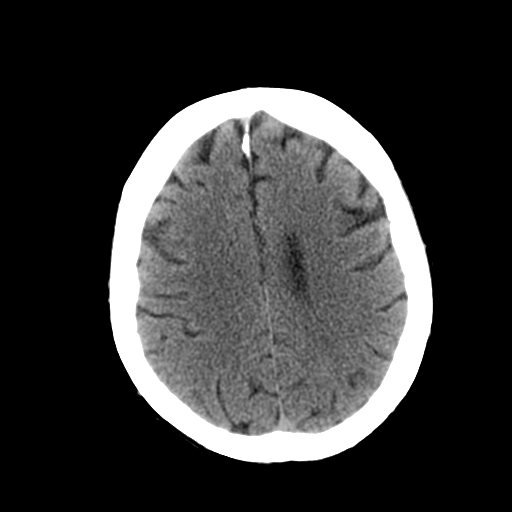
[im 21/36  bone]
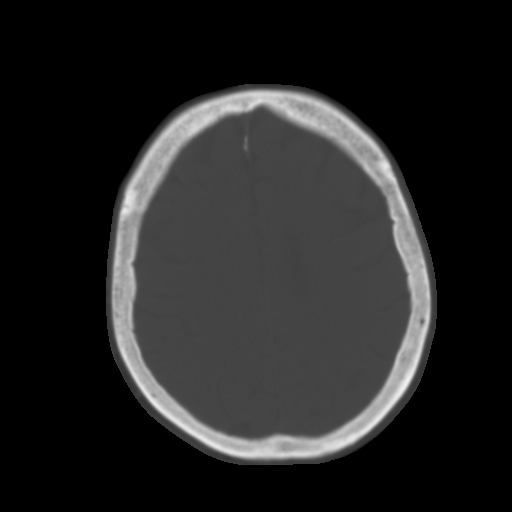
[im 22/36  brain]
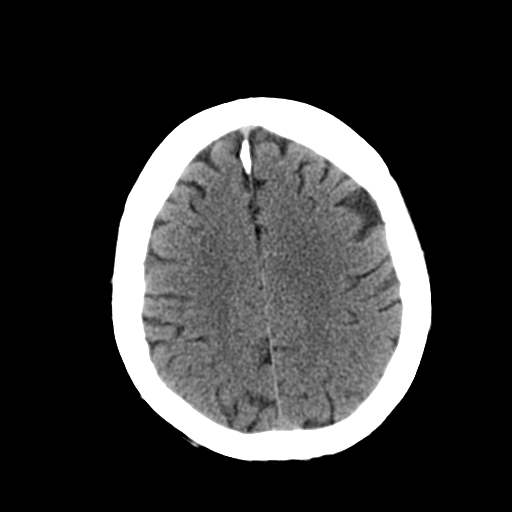
[im 26/36  brain]
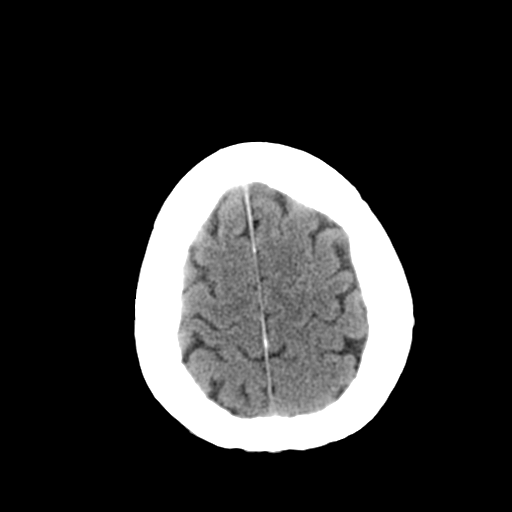
[im 27/36  brain]
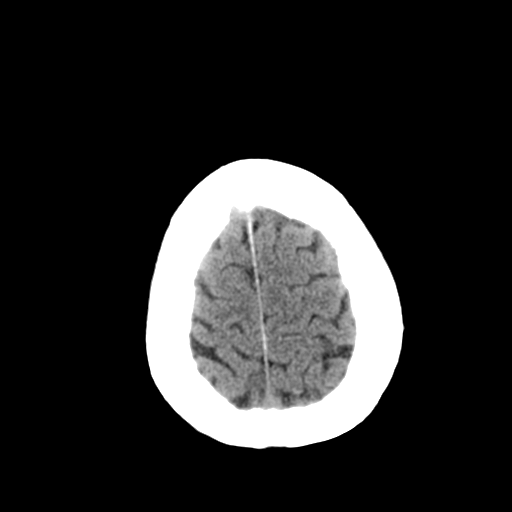
[im 29/36  brain]
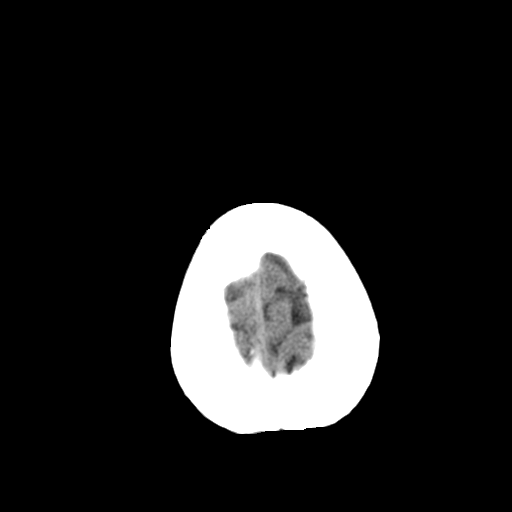
[im 29/36  bone]
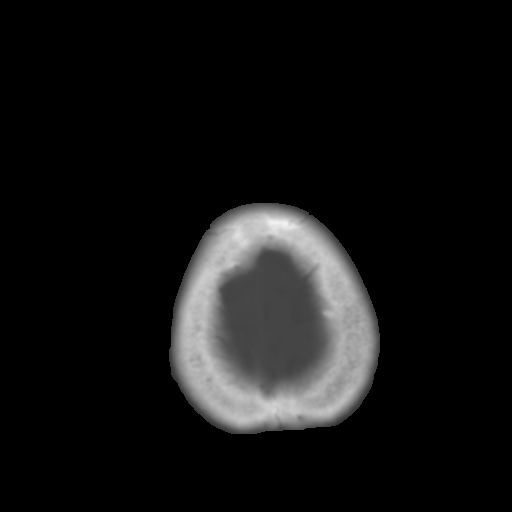
[im 32/36  brain]
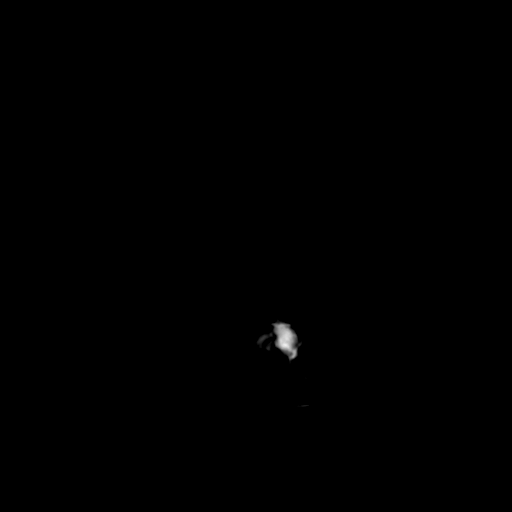
[im 34/36  brain]
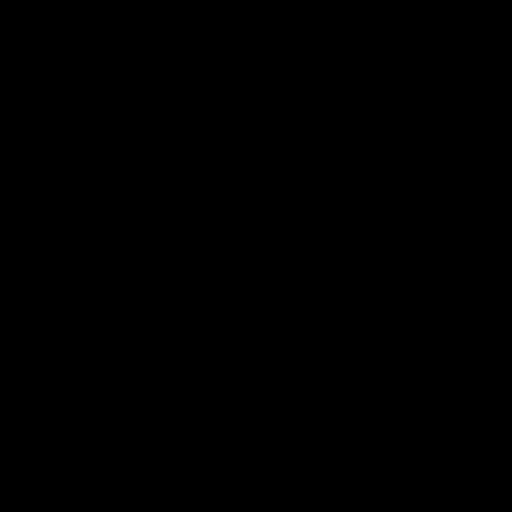

[15 of 30 positions shown; findings below may reference images not displayed]

FINDINGS: There is no evidence of acute infarction, mass lesion, or intra- or
extra-axial hemorrhage on CT.

Prominence of the ventricles and sulci reflects mild cortical volume
loss. Mild cerebellar atrophy is noted. Mild periventricular white
matter change likely reflects small vessel ischemic microangiopathy.

The brainstem and fourth ventricle are within normal limits. The
basal ganglia are unremarkable in appearance. The cerebral
hemispheres demonstrate grossly normal gray-white differentiation.
No mass effect or midline shift is seen.

There is no evidence of fracture; visualized osseous structures are
unremarkable in appearance. The orbits are within normal limits.
There is opacification of the right side of the sphenoid sinus. The
remaining paranasal sinuses and mastoid air cells are well-aerated.
No significant soft tissue abnormalities are seen.
IMPRESSION: 1. No acute intracranial pathology seen on CT.
2. Mild cortical volume loss and scattered small vessel ischemic
microangiopathy.
3. Opacification of the right side of the sphenoid sinus.

## 2016-08-26 IMAGING — CR DG CHEST 2V
2 series · 2 of 2 positions shown · non-contrast
Comparison: Chest radiograph performed 04/04/2014

CLINICAL DATA: Acute onset of dizziness and hypotension. Syncope
earlier tonight. Initial encounter.

EXAM:
CHEST  2 VIEW

[view not recorded (1 of 2)]
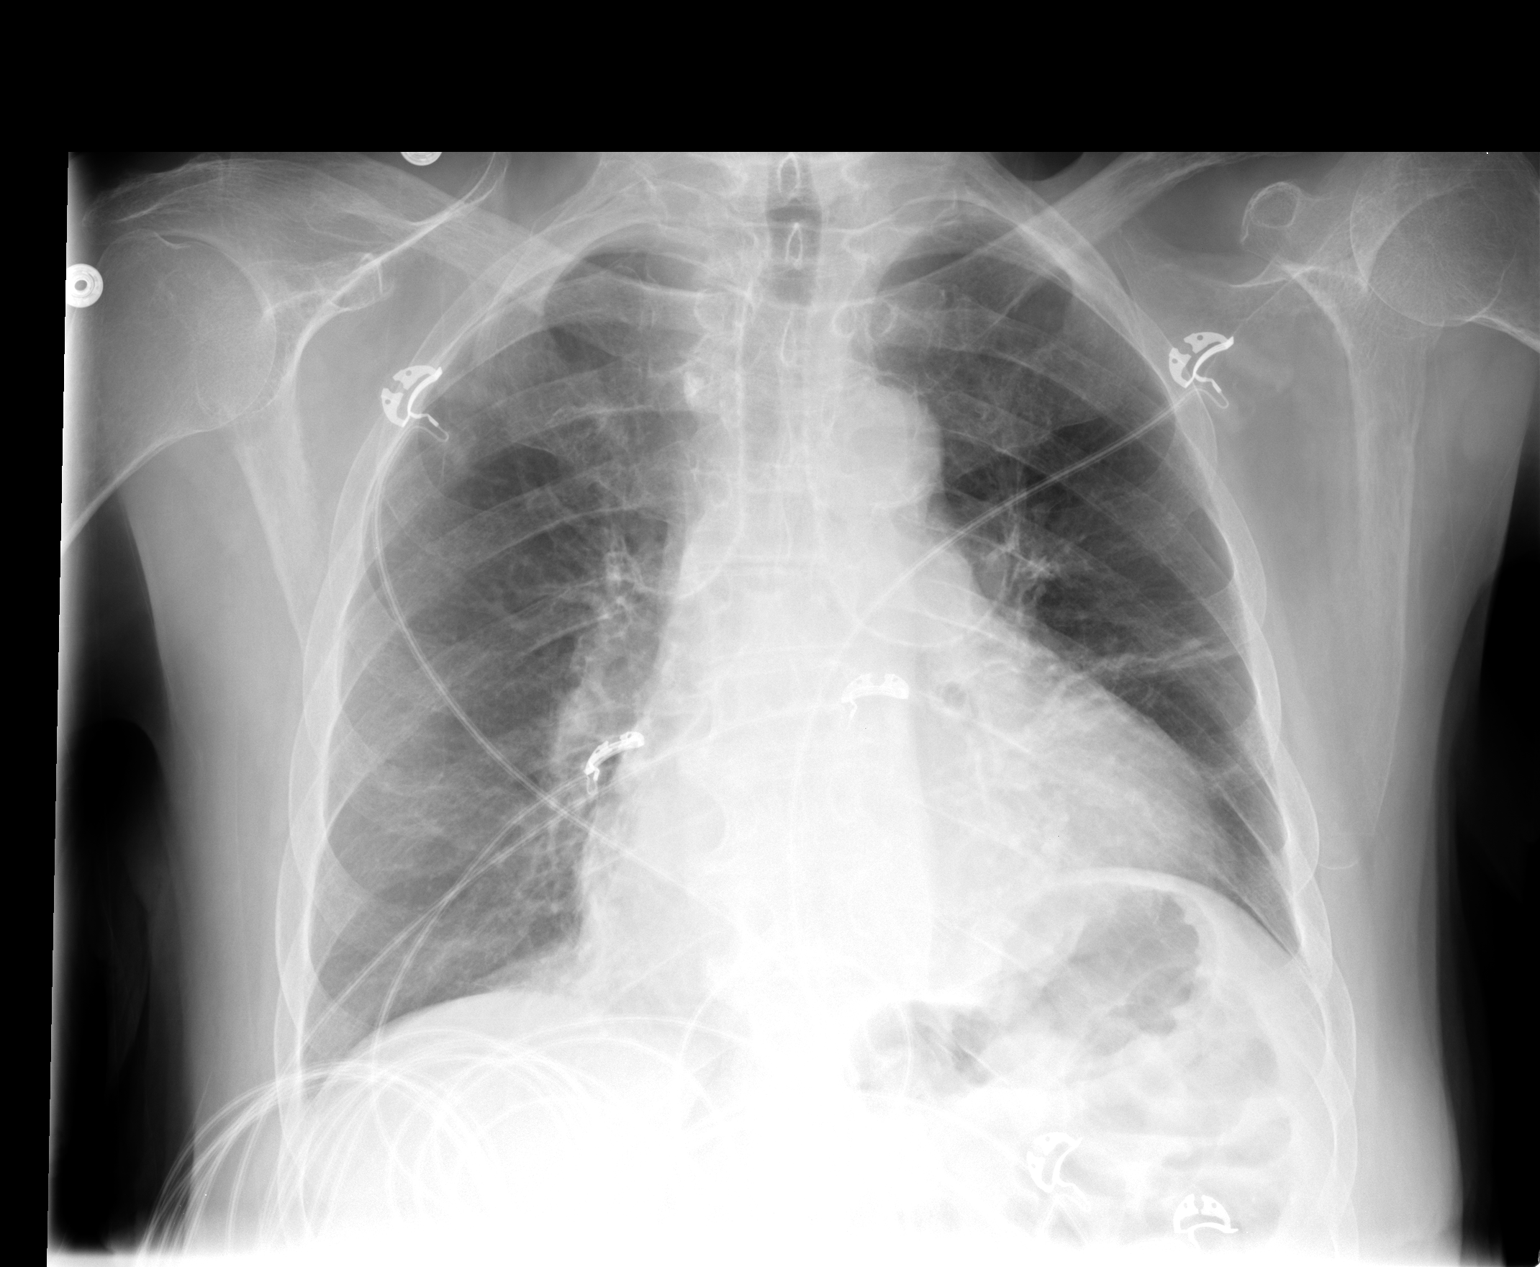

[view not recorded (2 of 2)]
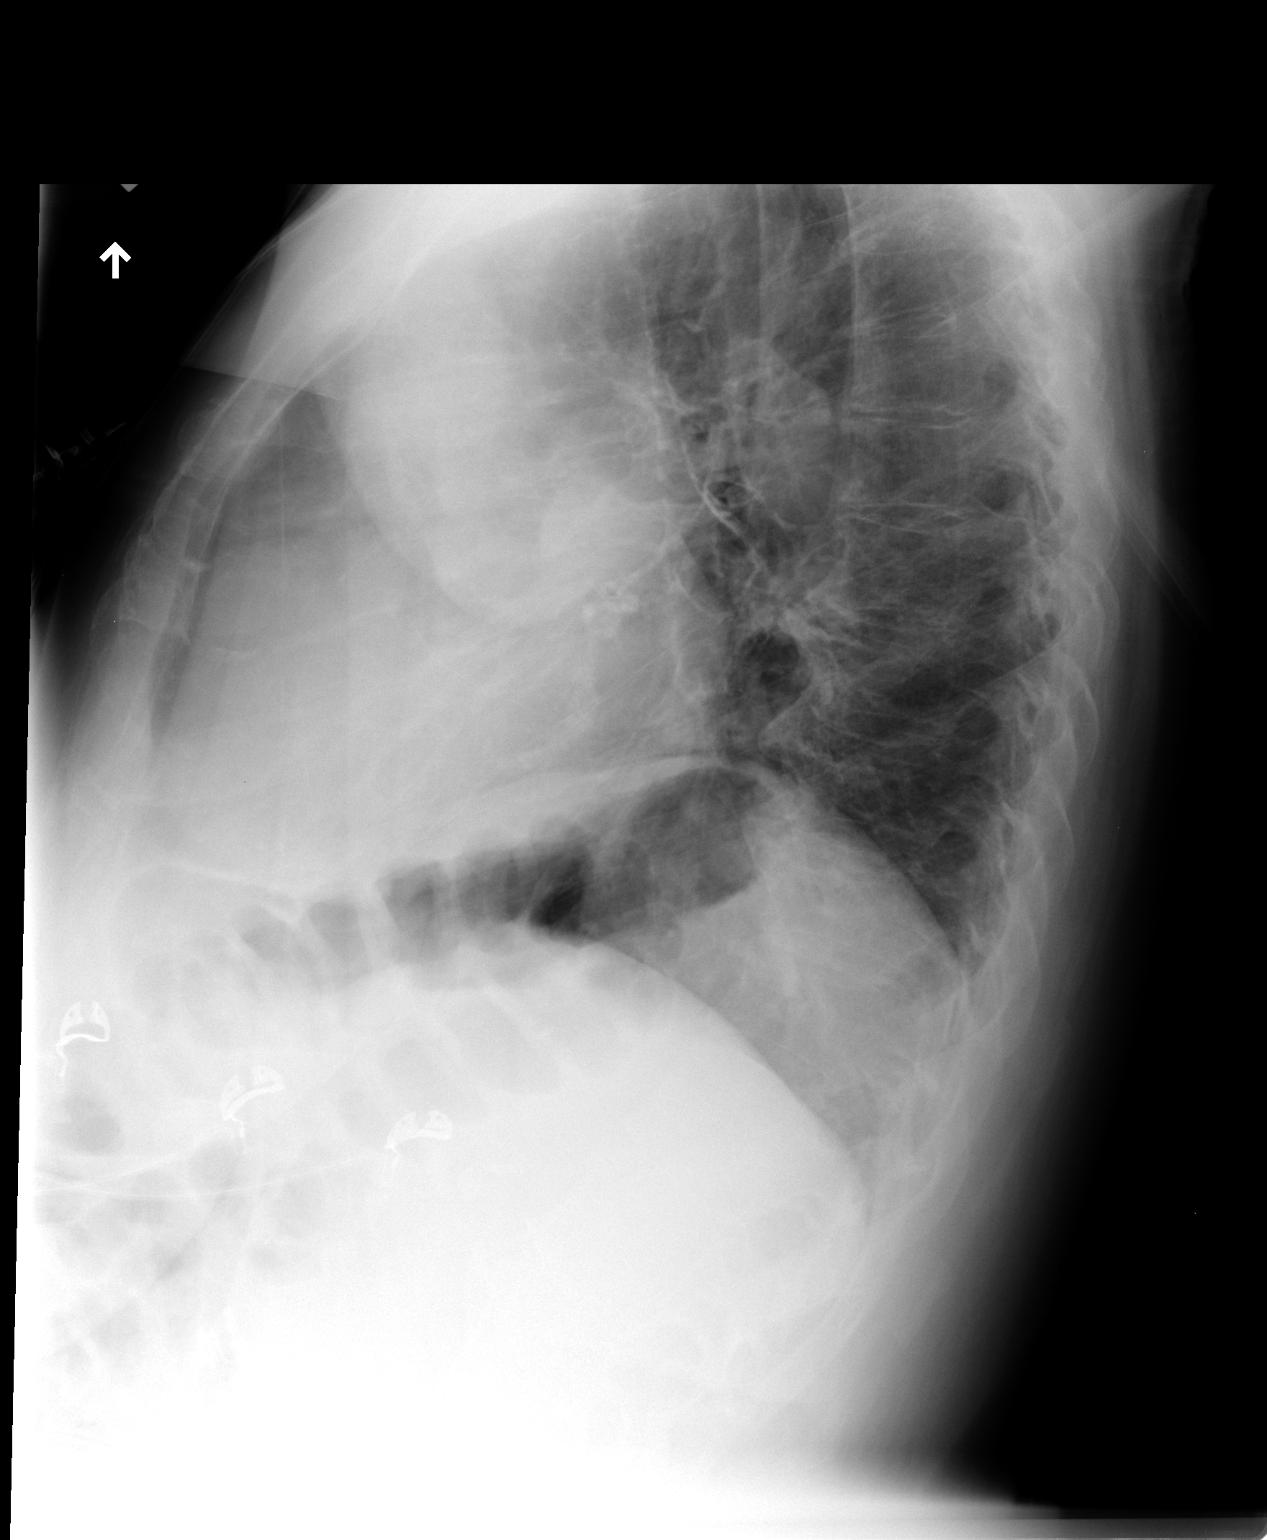

[2 of 2 positions shown; findings below may reference images not displayed]

FINDINGS: The lungs are well-aerated. Minimal bibasilar opacities likely
reflect atelectasis. There is no evidence of pleural effusion or
pneumothorax.

The heart is mildly enlarged. No acute osseous abnormalities are
seen.
IMPRESSION: Minimal bibasilar opacities likely reflect atelectasis; lungs
otherwise clear. Mild cardiomegaly.

## 2016-09-14 ENCOUNTER — Other Ambulatory Visit (HOSPITAL_COMMUNITY): Payer: Self-pay | Admitting: Family Medicine

## 2016-09-14 ENCOUNTER — Ambulatory Visit (HOSPITAL_COMMUNITY)
Admission: RE | Admit: 2016-09-14 | Discharge: 2016-09-14 | Disposition: A | Discharge status: Home / Self Care | Payer: Medicare Other | Source: Ambulatory Visit | Attending: Family Medicine | Admitting: Family Medicine

## 2016-09-14 DIAGNOSIS — R059 Cough, unspecified: Secondary | ICD-10-CM

## 2016-09-14 DIAGNOSIS — J189 Pneumonia, unspecified organism: Secondary | ICD-10-CM | POA: Diagnosis not present

## 2016-09-14 DIAGNOSIS — I700 Atherosclerosis of aorta: Secondary | ICD-10-CM | POA: Insufficient documentation

## 2016-09-14 DIAGNOSIS — J984 Other disorders of lung: Secondary | ICD-10-CM | POA: Diagnosis not present

## 2016-09-14 DIAGNOSIS — R05 Cough: Secondary | ICD-10-CM

## 2016-09-15 ENCOUNTER — Other Ambulatory Visit: Payer: Self-pay | Admitting: Cardiology

## 2016-09-30 ENCOUNTER — Other Ambulatory Visit (HOSPITAL_COMMUNITY): Payer: Self-pay | Admitting: Family Medicine

## 2016-09-30 ENCOUNTER — Ambulatory Visit (HOSPITAL_COMMUNITY)
Admission: RE | Admit: 2016-09-30 | Discharge: 2016-09-30 | Disposition: A | Discharge status: Home / Self Care | Payer: Medicare Other | Source: Ambulatory Visit | Attending: Family Medicine | Admitting: Family Medicine

## 2016-09-30 DIAGNOSIS — J189 Pneumonia, unspecified organism: Secondary | ICD-10-CM

## 2016-09-30 DIAGNOSIS — R918 Other nonspecific abnormal finding of lung field: Secondary | ICD-10-CM | POA: Diagnosis not present

## 2016-10-11 ENCOUNTER — Ambulatory Visit (HOSPITAL_COMMUNITY)
Admission: RE | Admit: 2016-10-11 | Discharge: 2016-10-11 | Disposition: A | Discharge status: Home / Self Care | Payer: Medicare Other | Source: Ambulatory Visit | Attending: Family Medicine | Admitting: Family Medicine

## 2016-10-11 ENCOUNTER — Other Ambulatory Visit (HOSPITAL_COMMUNITY): Payer: Self-pay | Admitting: Family Medicine

## 2016-10-11 DIAGNOSIS — I700 Atherosclerosis of aorta: Secondary | ICD-10-CM | POA: Insufficient documentation

## 2016-10-11 DIAGNOSIS — I2721 Secondary pulmonary arterial hypertension: Secondary | ICD-10-CM | POA: Diagnosis not present

## 2016-10-11 DIAGNOSIS — R9389 Abnormal findings on diagnostic imaging of other specified body structures: Secondary | ICD-10-CM

## 2016-10-11 DIAGNOSIS — I517 Cardiomegaly: Secondary | ICD-10-CM | POA: Diagnosis not present

## 2016-10-11 DIAGNOSIS — J984 Other disorders of lung: Secondary | ICD-10-CM | POA: Diagnosis not present

## 2016-10-11 DIAGNOSIS — R911 Solitary pulmonary nodule: Secondary | ICD-10-CM | POA: Diagnosis present

## 2016-10-11 MED ORDER — IOPAMIDOL (ISOVUE-300) INJECTION 61%
75.0000 mL | Freq: Once | INTRAVENOUS | Status: AC | PRN
  Administered 2016-10-11: 75 mL via INTRAVENOUS

## 2016-10-17 ENCOUNTER — Other Ambulatory Visit: Payer: Self-pay | Admitting: Internal Medicine

## 2016-10-17 ENCOUNTER — Other Ambulatory Visit: Payer: Self-pay | Admitting: Nurse Practitioner

## 2016-10-17 ENCOUNTER — Other Ambulatory Visit: Payer: Self-pay | Admitting: Cardiology

## 2016-10-25 ENCOUNTER — Other Ambulatory Visit: Payer: Self-pay | Admitting: Cardiology

## 2016-10-26 ENCOUNTER — Telehealth: Payer: Self-pay | Admitting: Cardiology

## 2016-10-26 ENCOUNTER — Other Ambulatory Visit: Payer: Self-pay

## 2016-10-26 MED ORDER — AMIODARONE HCL 200 MG PO TABS: ORAL_TABLET | ORAL | 0 refills | Status: DC

## 2016-10-26 NOTE — Telephone Encounter (Signed)
Refill on Amiodarone sent to [facility] / tg

## 2016-10-26 NOTE — Telephone Encounter (Signed)
Sent refill to [facility]. 15 days ( pt needs appointment)

## 2016-10-27 ENCOUNTER — Other Ambulatory Visit (HOSPITAL_COMMUNITY): Payer: Self-pay | Admitting: Cardiology

## 2016-10-27 MED ORDER — POTASSIUM CHLORIDE ER 10 MEQ PO TBCR: EXTENDED_RELEASE_TABLET | ORAL | 0 refills | Status: DC

## 2016-10-27 NOTE — Telephone Encounter (Signed)
Last seen in CHF clinic 2016 Last office visit 07/2016 with K.Lawrence

## 2016-11-21 ENCOUNTER — Other Ambulatory Visit (HOSPITAL_COMMUNITY): Payer: Self-pay | Admitting: Adult Health

## 2016-11-21 ENCOUNTER — Other Ambulatory Visit: Payer: Self-pay | Admitting: Cardiology

## 2016-12-04 IMAGING — DX DG CHEST 1V
1 series · 1 of 1 positions shown · non-contrast
Comparison: PA and lateral chest 04/11/2014. Single view of the
chest 12/19/2013. CT chest 12/20/2013.

CLINICAL DATA: Atrial fibrillation, tachycardia.

EXAM:
CHEST - 1 VIEW

[chest ap]
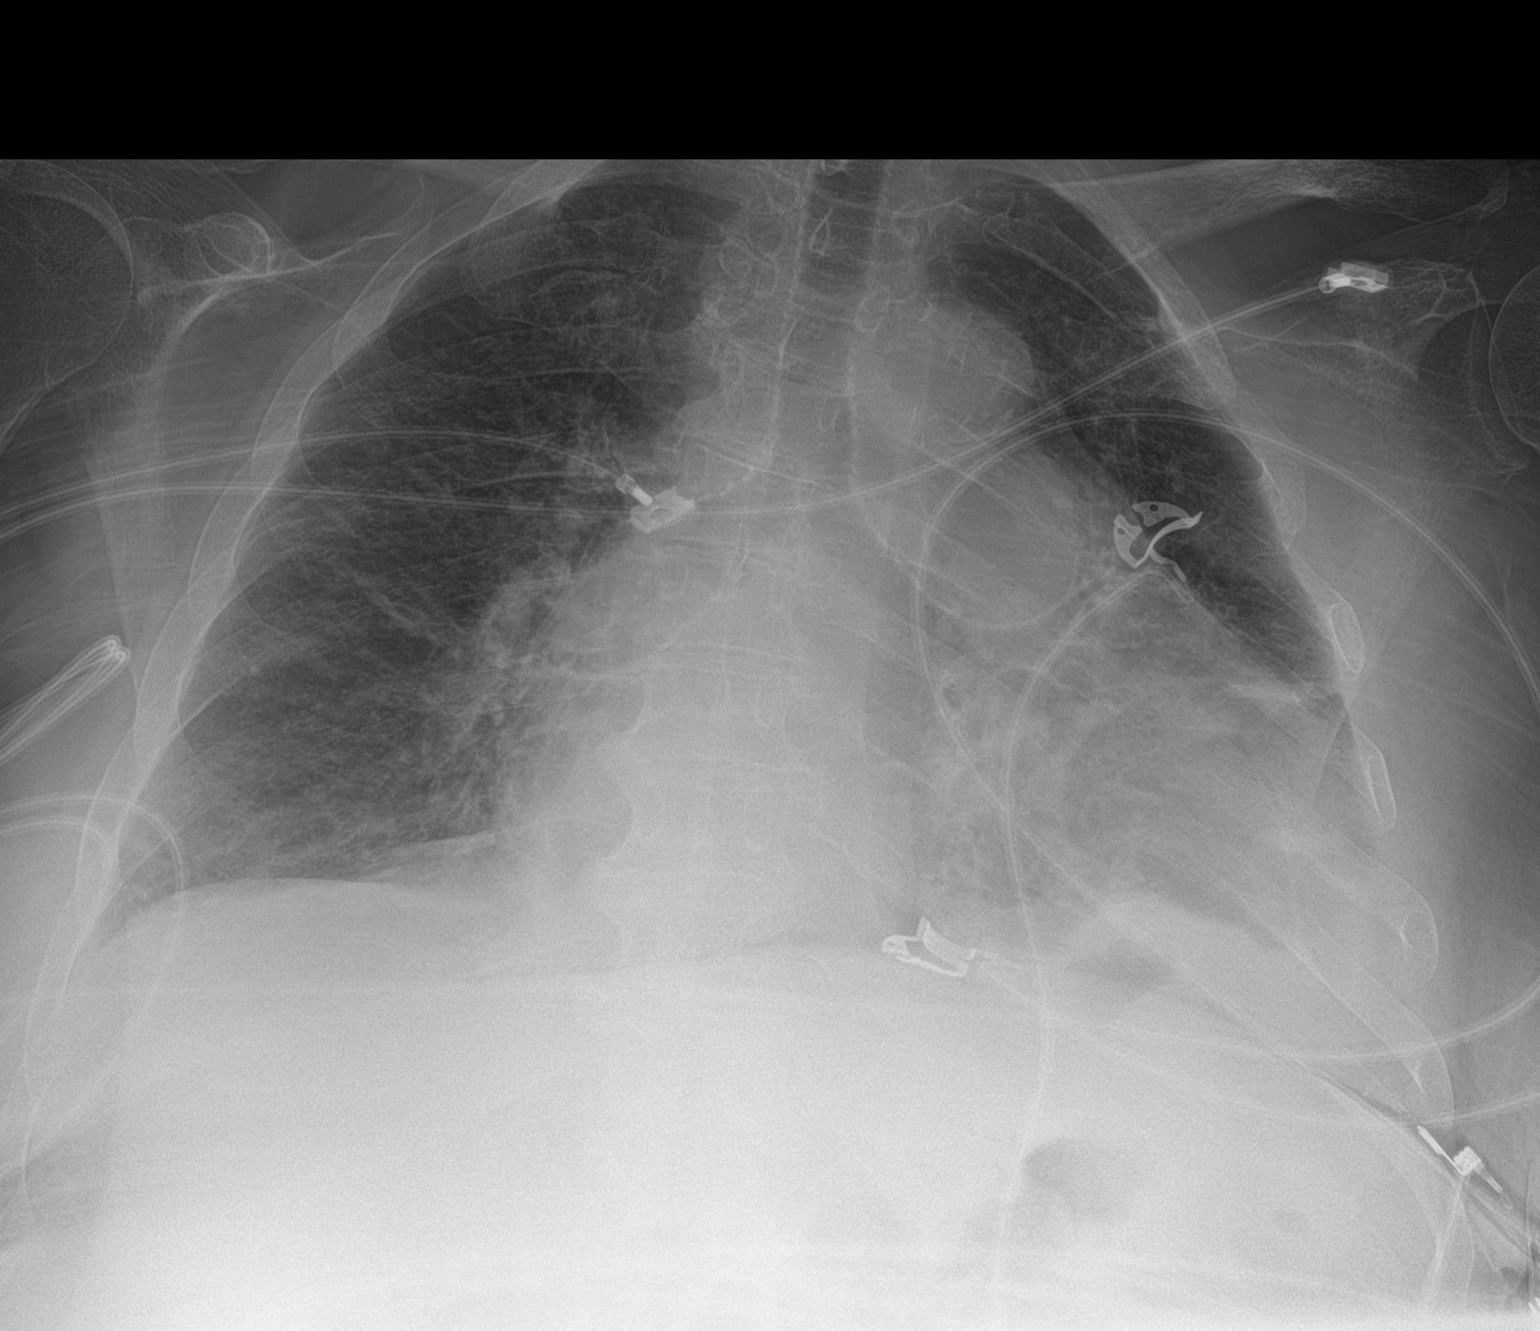

[1 of 1 positions shown; findings below may reference images not displayed]

FINDINGS: There is marked cardiomegaly without edema. The patient has a small
left pleural effusion and basilar airspace disease. The right lung
appears clear. No pneumothorax is identified.
IMPRESSION: Small left pleural effusion and basilar airspace disease which could
be atelectasis or pneumonia.

Cardiomegaly without edema.

## 2016-12-21 ENCOUNTER — Other Ambulatory Visit: Payer: Self-pay | Admitting: Cardiology

## 2017-01-09 IMAGING — CT CT ABD-PELV W/ CM
2 of 5 series · 16 of 46 positions shown, 18 images · IV contrast (Omnipaque 300)
Comparison: 09/03/2013 CT.

CLINICAL DATA: 63-year-old diabetic hypertensive female with
generalized abdominal pain and swelling for the past month. Nausea,
diarrhea and constipation. On prior CT, history of hysterectomy,
cholecystectomy and appendectomy. History of small-bowel ulcers.
Initial encounter.

EXAM:
CT ABDOMEN AND PELVIS WITH CONTRAST
TECHNIQUE: Multidetector CT imaging of the abdomen and pelvis was performed
using the standard protocol following bolus administration of
intravenous contrast.
CONTRAST:  100mL OMNIPAQUE IOHEXOL 300 MG/ML  SOLN

[Series 2: abd_pel_with 5.0 b40f · axial · 0.87mm/px · z∈[-488,-43]mm · 13 of 99 slices shown, 15 images]
[im 5/99  soft-tissue]
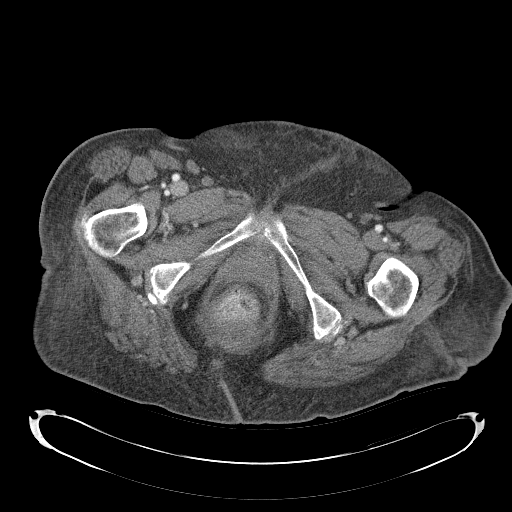
[im 5/99  bone]
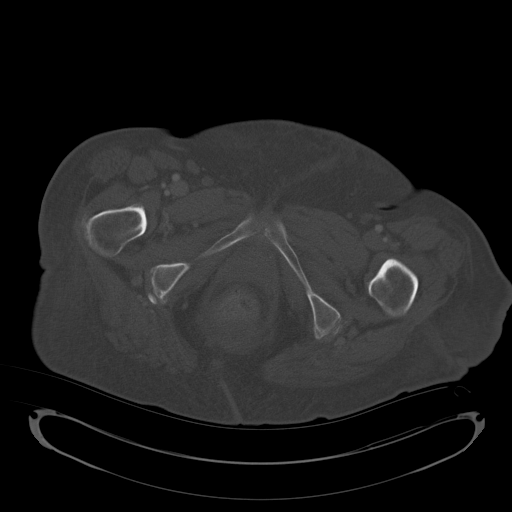
[im 15/99  soft-tissue]
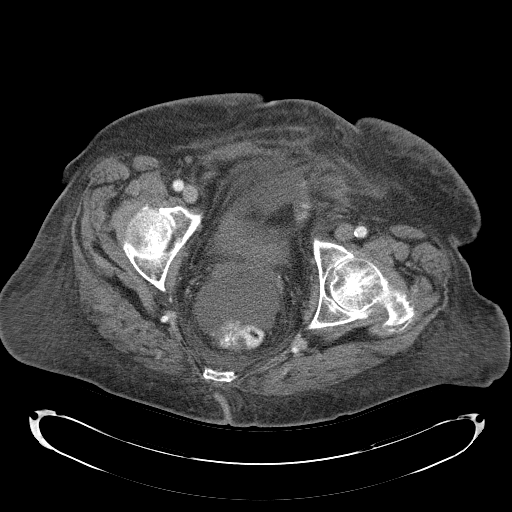
[im 20/99  soft-tissue]
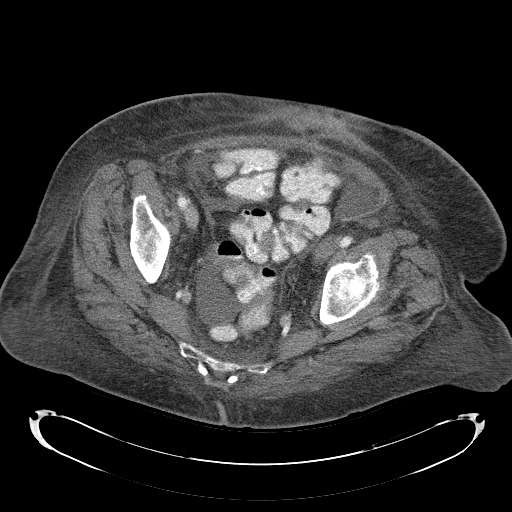
[im 30/99  soft-tissue]
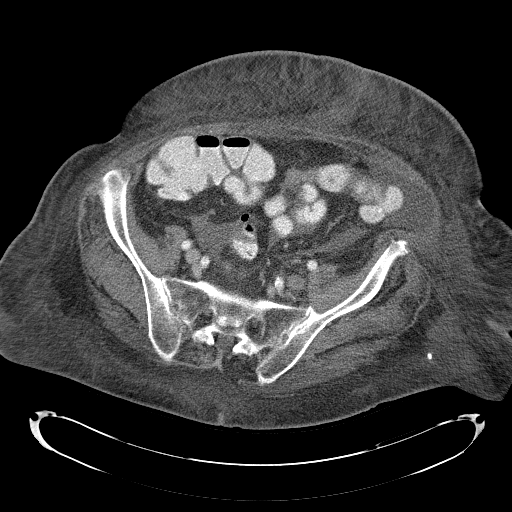
[im 35/99  soft-tissue]
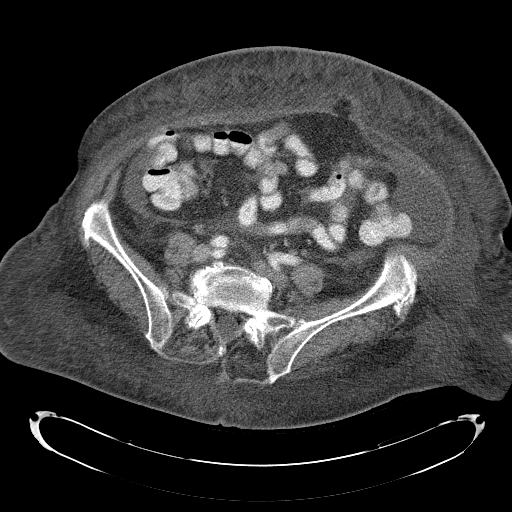
[im 45/99  soft-tissue]
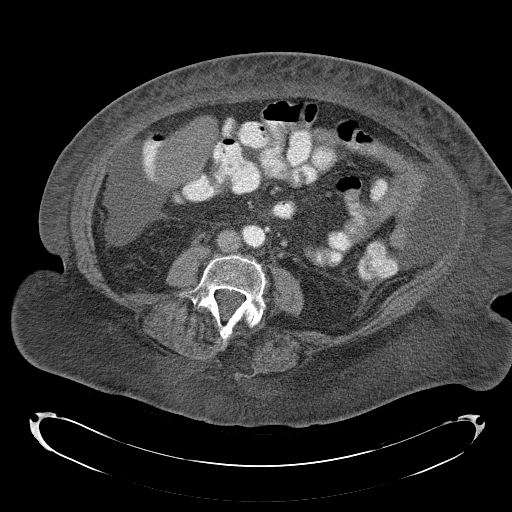
[im 50/99  soft-tissue]
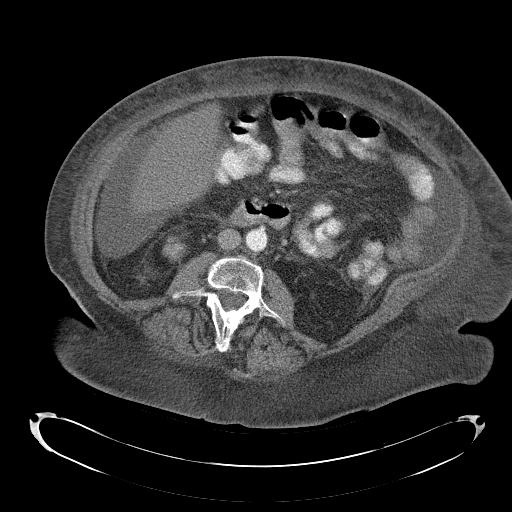
[im 54/99  soft-tissue]
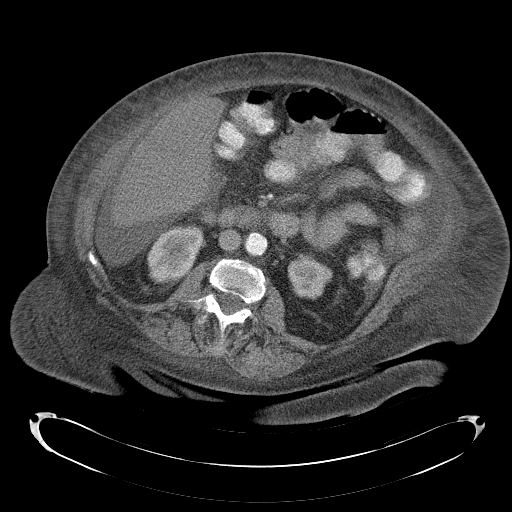
[im 64/99  soft-tissue]
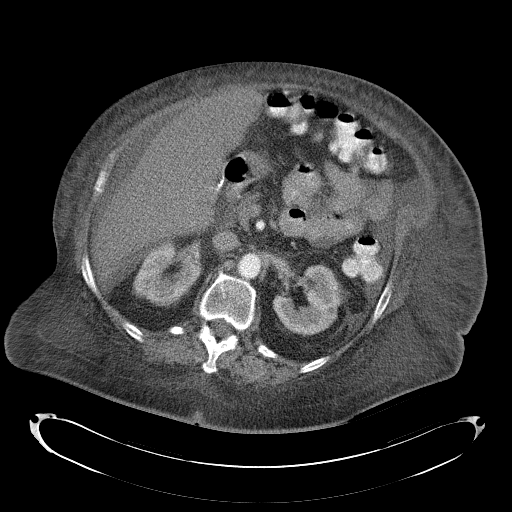
[im 64/99  bone]
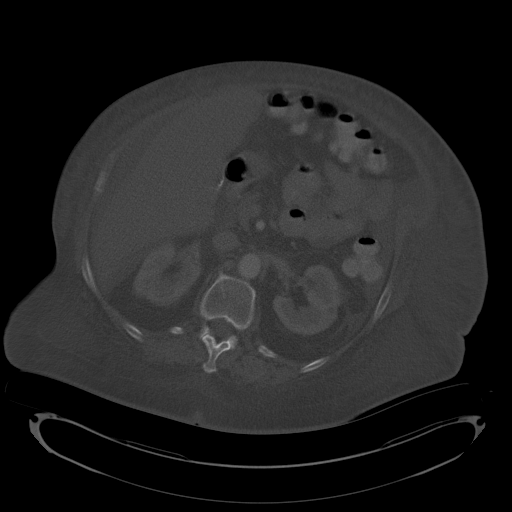
[im 69/99  soft-tissue]
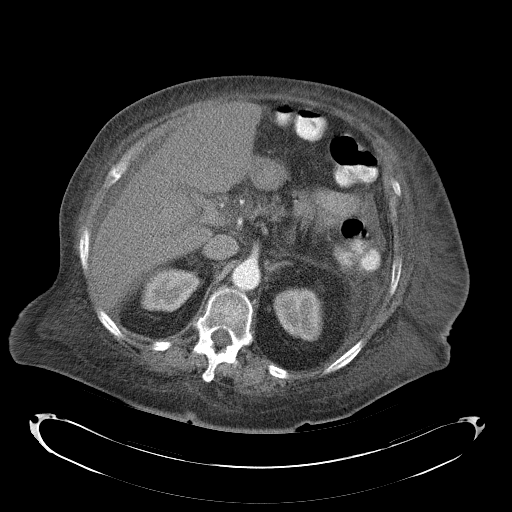
[im 79/99  soft-tissue]
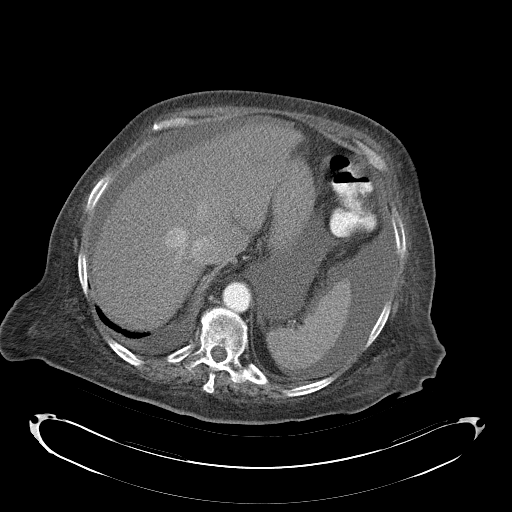
[im 84/99  soft-tissue]
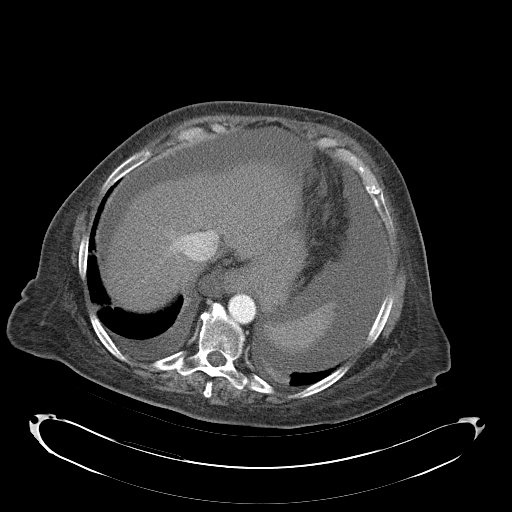
[im 94/99  soft-tissue]
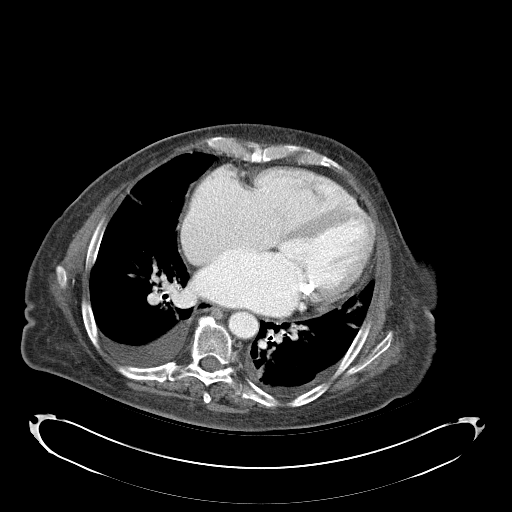

[Series 3: abd_pel_with 3.0 spo · coronal · 0.89mm/px · 3 of 102 slices shown]
[im 34/102  soft-tissue]
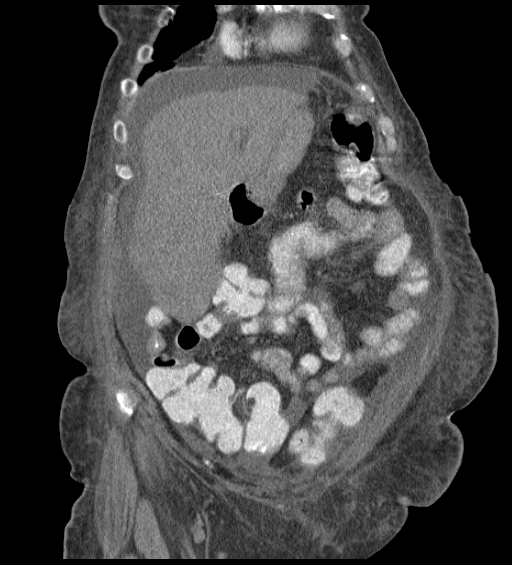
[im 45/102  soft-tissue]
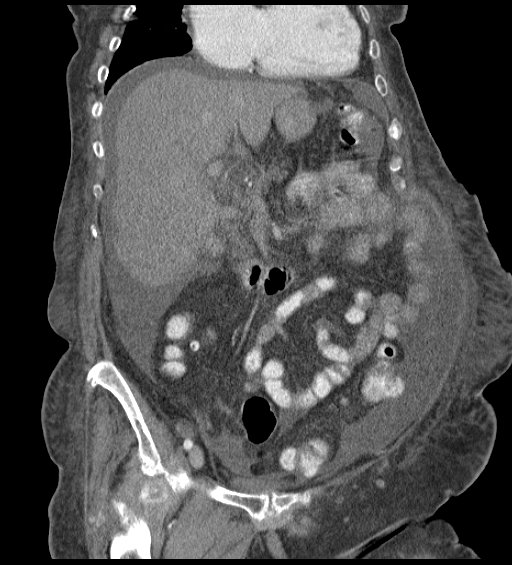
[im 57/102  soft-tissue]
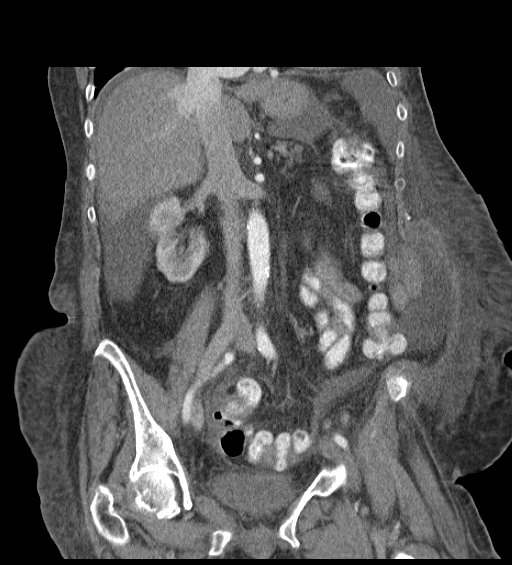

[16 of 46 positions shown; findings below may reference images not displayed]

FINDINGS: Respiratory motion degraded examination.

Ascites. Third spacing of fluid. Etiology indeterminate but may be
related to heart dysfunction. Prominent hepatic veins.

Cardiomegaly.  Mitral valve calcifications.

Small plural effusions with basilar atelectasis.

Atherosclerotic type changes aorta and branch vessels most notable
involving the proximal renal artery bilaterally.

Evaluation for extra luminal bowel inflammatory process is limited
given the ascites. No extra luminal free air. Under distended
stomach without obvious primary bowel abnormality.

Liver is enlarged spanning over 21 cm. No focal worrisome hepatic
lesion. Post cholecystectomy.

No worrisome splenic, pancreatic, renal or adrenal abnormality. Post
cholecystectomy.

No adenopathy.  Slightly rounded left external iliac node unchanged.

Congenital fusion L2-3. Degenerative changes lower thoracic spine
and lumbar spine and most notable L4-5 and L5-S1 level. No osseous
destructive lesion.

Post hysterectomy.  No adnexal mass identified.

Fatty containing anterior abdominal wall hernia.
IMPRESSION: Respiratory motion degraded examination.

Ascites. Third spacing of fluid. Etiology indeterminate but may be
related to heart dysfunction. Prominent hepatic veins.

Cardiomegaly.  Mitral valve calcifications.

Small plural effusions with basilar atelectasis.

Atherosclerotic type changes aorta and branch vessels most notable
involving the proximal renal artery bilaterally.

Evaluation for extra luminal bowel inflammatory process is limited
given the ascites. No extra luminal free air.

Liver is enlarged spanning over 21 cm.

These results will be called to the ordering clinician or
representative by the Radiologist Assistant, and communication
documented in the PACS or zVision Dashboard.

## 2017-01-18 ENCOUNTER — Other Ambulatory Visit (HOSPITAL_COMMUNITY): Payer: Self-pay | Admitting: Cardiology

## 2017-01-24 ENCOUNTER — Ambulatory Visit (INDEPENDENT_AMBULATORY_CARE_PROVIDER_SITE_OTHER): Payer: Medicare Other | Admitting: Gastroenterology

## 2017-01-24 ENCOUNTER — Encounter: Payer: Self-pay | Admitting: Gastroenterology

## 2017-01-24 VITALS — BP 117/75 | HR 64 | Temp 97.3°F | Ht 68.0 in | Wt 242.0 lb

## 2017-01-24 DIAGNOSIS — R197 Diarrhea, unspecified: Secondary | ICD-10-CM | POA: Diagnosis not present

## 2017-01-24 NOTE — Patient Instructions (Signed)
Please complete the stool tests.  You can increase imodium to twice a day for now. Monitor for constipation.   We will see you in 3 months.   Please call me on Thursday to let me know how you are doing.

## 2017-01-24 NOTE — Progress Notes (Signed)
Referring Provider: Gareth Morgan, MD Primary Care Physician:  Gareth Morgan, MD Primary GI: Dr. Darrick Penna   Chief Complaint  Patient presents with  . Diarrhea    HPI:   [name] is a 66 y.o. female presenting today with a history of chronic abdominal pain, gastroparesis, IBS-D, history of IDA. EGD in May 2016 due to anemia and history of Barrett's without evidence of Barrett's. Last colonoscopy in May 2015. BPE has noted small to moderate sized sliding type hiatal hernia with spontaneous and inducible reflux, nonspecific esophageal dysmotility. Diarrhea has improved with Bentyl.   Had a flare up this past 1-2 weeks. Having leakage, uncontrollable at times. Incontinence episodes. Has frequent episodes of diarrhea during day. Takes lorazepam at night and wonders if this is knocking her out. No rectal bleeding. Takes loperamide and Bentyl. Last exposure to antibiotics in Feb 2018. Diarrhea better since not eating popcorn.   Past Medical History:  Diagnosis Date  . Anemia    H/H of 10/30 with a normal MCV in 12/09  . Anxiety   . Arthritis   . Barrett's esophagus    Diagnosed 1995. Last EGD 2016-NO BARRETT'S.   . Chest pain    Negative cardiac catheterization in 2002; negative stress nuclear study in 2008  . Chronic anticoagulation   . Chronic diastolic CHF (congestive heart failure) (HCC)    a. EF predominantly normal during prior echoes but was 40% during 10/2014 echo. b. Most recent 01/2015 EF normal, 55-60%.  . Chronic LBP    Surgical intervention in 1996  . Diabetes mellitus, type 2 (HCC)    Insulin therapy; exacerbated by prednisone  . Dysrhythmia    AFib  . Gastroparesis    99% retention 05/2008 on GES  . GERD (gastroesophageal reflux disease)   . Hiatal hernia   . Hyperlipidemia   . Hypertension   . Hypothyroid   . IBS (irritable bowel syndrome)   . Obesity   . OSA on CPAP    had CPAP and cannot tolerate.  . Paroxysmal atrial fibrillation (HCC)   . Pulmonary  hypertension (HCC) 01/2015   a. Predominantly pulmonary venous hypertension but may be component of PAH.  . Seizures (HCC)    last seizure was 2 years ago; on keppra for this; unknown etiology  . Syncope    a. Admitted 05/2009; magnetic resonance imagin/ MRA - negative; etiology thought to be orthostasis secondary to drugs and dehydration. b. Syncope 02/2015 also felt 2/2 dehydration.    Past Surgical History:  Procedure Laterality Date  . BACK SURGERY  1996  . BIOPSY N/A 11/08/2013   Procedure: BIOPSY  / Tissue sampling / ulcers present in small intestine;  Surgeon: West Bali, MD;  Location: [facility];  Service: Endoscopy;  Laterality: N/A;  . CARDIAC CATHETERIZATION  2002  . CARDIAC CATHETERIZATION N/A 01/26/2015   Procedure: Right Heart Cath;  Surgeon: Laurey Morale, MD;  Location: [facility] INVASIVE CV LAB;  Service: Cardiovascular;  Laterality: N/A;  . CARDIOVASCULAR STRESS TEST  2008   Stress nuclear study  . CARDIOVERSION N/A 03/06/2015   Procedure: CARDIOVERSION;  Surgeon: Laurey Morale, MD;  Location: [facility] ENDOSCOPY;  Service: Cardiovascular;  Laterality: N/A;  . CARPAL TUNNEL RELEASE  1994  . COLONOSCOPY  11/2011   Dr. Darrick Penna: Internal hemorrhoids, mild diverticulosis. Random colon biopsies negative.  . COLONOSCOPY N/A 11/08/2013   SLF: Normal mucosa in the terminal ileum/The colon IS redundant/  Moderate diverticulosis throughout the entire colon.  ileum bx benign. colon bx benign  . ESOPHAGOGASTRODUODENOSCOPY  2008   Barrett's without dysplasia. esphagus dilated. antral erosions, h.pylori serologies negative.  . ESOPHAGOGASTRODUODENOSCOPY  11/2011   Dr. Darrick Penna: Barrett's esophagus, mild gastritis, diverticulum in the second portion of the duodenum repeat EGD 3 years. Small bowel biopsies negative. Gastric biopsy show reactive gastropathy but no H. pylori. Esophageal biopsies consistent with GERD. Next EGD 11/2014  . ESOPHAGOGASTRODUODENOSCOPY N/A 11/21/2014   ZOX:WRUE non-erosive  gastritis/irregular z-line  . GIVENS CAPSULE STUDY  12/07/2011   Proximal small bowel, rare AVM. Distal small bowel, multiple ulcers noted  . GIVENS CAPSULE STUDY N/A 09/27/2013   Distal small bowel ulcers extending to TI.  Marland Kitchen GIVENS CAPSULE STUDY N/A 10/10/2013   Procedure: GIVENS CAPSULE STUDY;  Surgeon: West Bali, MD;  Location: [facility];  Service: Endoscopy;  Laterality: N/A;  7:30  . KNEE ARTHROSCOPY WITH MEDIAL MENISECTOMY Right 06/09/2016   Procedure: KNEE ARTHROSCOPY WITH MEDIAL MENISECTOMY;  Surgeon: Vickki Hearing, MD;  Location: [facility] OR[facility];  Service: Orthopedics;  Laterality: Right;  medial and lateral menisectomy  . LAMINECTOMY  1995   L4-L5  . LAPAROSCOPIC CHOLECYSTECTOMY  1990s  . LEFT HEART CATHETERIZATION WITH CORONARY ANGIOGRAM  01/10/2014   Procedure: LEFT HEART CATHETERIZATION WITH CORONARY ANGIOGRAM;  Surgeon: Lesleigh Noe, MD;  Location: [facility] CATH LAB;  Service: Cardiovascular;;  . RIGHT HEART CATHETERIZATION N/A 01/10/2014   Procedure: RIGHT HEART CATH;  Surgeon: Lesleigh Noe, MD;  Location: [facility] CATH LAB;  Service: Cardiovascular;  Laterality: N/A;  . TOTAL ABDOMINAL HYSTERECTOMY  1999    Current Outpatient Prescriptions  Medication Sig Dispense Refill  . acetaminophen (TYLENOL) 500 MG tablet Take 500 mg by mouth every 6 (six) hours as needed for moderate pain or headache.    . albuterol (PROVENTIL HFA;VENTOLIN HFA) 108 (90 Base) MCG/ACT inhaler Inhale 2 puffs into the lungs every 4 (four) hours as needed for wheezing or shortness of breath. 1 Inhaler 0  . amiodarone (PACERONE) 200 MG tablet TAKE 1 TABLET BY MOUTH EVERY DAY( pt needs office visit for further refills) 15 tablet 0  . amiodarone (PACERONE) 200 MG tablet TAKE ONE TABLET BY MOUTH EVERY MORNING 30 tablet 0  . apixaban (ELIQUIS) 5 MG TABS tablet Take 1 tablet (5 mg total) by mouth 2 (two) times daily. 60 tablet 0  . dicyclomine (BENTYL) 10 MG capsule TAKE 1 TO 2 CAPSULES BY MOUTH BEFORE MEALS AS  NEEDED FOR ABDOMINAL CRAMPING AND LOOSE STOOLS 180 capsule 3  . DULoxetine (CYMBALTA) 30 MG capsule Take 30 mg by mouth 2 (two) times daily.    . famotidine (PEPCID) 20 MG tablet Take 20 mg by mouth at bedtime.     . furosemide (LASIX) 40 MG tablet Take 1 tablet (40 mg total) by mouth daily. (Patient taking differently: Take 20 mg by mouth daily. ) 30 tablet 0  . gabapentin (NEURONTIN) 300 MG capsule Take 300 mg by mouth 3 (three) times daily.    Marland Kitchen glipiZIDE (GLUCOTROL) 10 MG tablet Take 10 mg by mouth 2 (two) times daily before a meal.     . Insulin Glargine (LANTUS SOLOSTAR) 100 UNIT/ML Solostar Pen Inject 20-50 Units into the skin at bedtime as needed. Patient takes 20 units every night at bedtime if over 178.  Takes 50 units if her blood sugar is above 300.    . levETIRAcetam (KEPPRA) 750 MG tablet Take 750 mg by mouth 2 (two) times daily.    Marland Kitchen  levothyroxine (SYNTHROID, LEVOTHROID) 137 MCG tablet Take 137 mcg by mouth daily.    Marland Kitchen loperamide (IMODIUM) 2 MG capsule TAKE 1 CAPSULE BY MOUTH AS NEEDED FOR DIARRHEA OR LOOSE STOOLS 30 capsule 5  . LORazepam (ATIVAN) 1 MG tablet Take 1 mg by mouth 3 (three) times daily as needed for anxiety or sleep. For anxiety    . magnesium oxide (MAG-OX) 400 MG tablet Take 400 mg by mouth daily.     . metaxalone (SKELAXIN) 800 MG tablet Take 800 mg by mouth 3 (three) times daily.     . metFORMIN (GLUCOPHAGE) 500 MG tablet Take 1,000 mg by mouth 2 (two) times daily with a meal.     . Multiple Vitamin (MULTIVITAMIN WITH MINERALS) TABS tablet Take 1 tablet by mouth daily.    . nitroGLYCERIN (NITROSTAT) 0.4 MG SL tablet Place 0.4 mg under the tongue every 5 (five) minutes as needed for chest pain. Reported on 07/08/2015    . ondansetron (ZOFRAN) 4 MG tablet Take 1 tablet (4 mg total) by mouth 4 (four) times daily -  before meals and at bedtime. As needed for nausea. 120 tablet 11  . pantoprazole (PROTONIX) 40 MG tablet Take 1 tablet (40 mg total) by mouth 2 (two) times  daily before a meal. 180 tablet 3  . potassium chloride (K-DUR,KLOR-CON) 10 MEQ tablet TAKE TWO TABLETS BY MOUTH TWICE DAILY 120 tablet 0  . promethazine (PHENERGAN) 12.5 MG tablet Take 1 tablet (12.5 mg total) by mouth every 4 (four) hours as needed for nausea or vomiting. 42 tablet 0  . vitamin B-12 (CYANOCOBALAMIN) 500 MCG tablet Take 500 mcg by mouth daily.    Marland Kitchen doxycycline (VIBRA-TABS) 100 MG tablet Take 1 tablet (100 mg total) by mouth 2 (two) times daily. (Patient not taking: Reported on 01/24/2017) 14 tablet 0  . HYDROcodone-acetaminophen (NORCO/VICODIN) 5-325 MG tablet Take 1 tablet by mouth every 6 (six) hours as needed for moderate pain. (Patient not taking: Reported on 01/24/2017) 42 tablet 0   No current facility-administered medications for this visit.     Allergies as of 01/24/2017 - Review Complete 01/24/2017  Allergen Reaction Noted  . Ibuprofen Other (See Comments) 10/13/2013  . Codeine Nausea And Vomiting and Other (See Comments)   . Dilaudid [hydromorphone hcl] Other (See Comments) 12/31/2015  . Celexa [citalopram hydrobromide] Other (See Comments) 06/30/2013  . Reglan [metoclopramide] Other (See Comments) 06/04/2014    Family History  Problem Relation Age of Onset  . Hypertension Mother   . Alzheimer's disease Mother   . Stroke Mother   . Heart attack Mother   . Hypertension Other   . Breast cancer Sister   . Heart disease Neg Hx   . Colon cancer Neg Hx     Social History   Social History  . Marital status: Married    Spouse name: N/A  . Number of children: N/A  . Years of education: N/A   Occupational History  . 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 Smoking status: Former Smoker    Packs/day: 0.25    Years: 15.00    Types: Cigarettes    Start date: 02/26/1966    Quit date: 07/01/1983  . Smokeless tobacco: Never Used     Comment: quit in 1984  . Alcohol use No     Comment: last etoh one year ago,  never frequent  . Drug use: No  .  Sexual activity: Yes    Birth control/ protection: None, Surgical   Other Topics Concern  . None   Social History Narrative   Sedentary   4 children, "blended family"    Review of Systems: As mentioned in HPI   Physical Exam: BP 117/75   Pulse 64   Temp (!) 97.3 F (36.3 C) (Oral)   Ht 5\' 8"  (1.727 m)   Wt 242 lb (109.8 kg)   BMI 36.80 kg/m  General:   Alert and oriented. No distress noted. Pleasant and cooperative.  Head:  Normocephalic and atraumatic. Eyes:  Conjuctiva clear without scleral icterus. Mouth:  Oral mucosa pink and moist. Good dentition. No lesions. Abdomen:  +BS, soft, non-tender and non-distended. No rebound or guarding. No HSM or masses noted. Msk:  Symmetrical without gross deformities. Normal posture. Extremities:  Without edema. Neurologic:  Alert and  oriented x4;  grossly normal neurologically. Psych:  Alert and cooperative. Normal mood and affect.

## 2017-01-25 NOTE — Assessment & Plan Note (Signed)
66 year old female with history of chronic diarrhea, now with worsening symptoms but notes improvement with avoidance of popcorn. Nocturnal episodes concerning. Check Cdiff now, although I doubt this will be positive. However, she was exposed to antibiotics several months ago. If this is negative, will adjust anti-diarrheal agents and supportive measures. Return in 3 months.

## 2017-01-26 NOTE — Progress Notes (Signed)
cc'ed to pcp °

## 2017-01-27 ENCOUNTER — Telehealth: Payer: Self-pay

## 2017-01-27 NOTE — Telephone Encounter (Signed)
Pt called- left a voicemail- she said she was ok and was doing a lot better. She said she has not done the stool tests yet but will turn them in next week.

## 2017-01-27 NOTE — Telephone Encounter (Signed)
Great!

## 2017-02-05 NOTE — Progress Notes (Signed)
REVIEWED. AWAITING STOOL SAMPLE. STOP EATING POCORN.

## 2017-02-07 ENCOUNTER — Other Ambulatory Visit: Payer: Self-pay | Admitting: Gastroenterology

## 2017-02-10 ENCOUNTER — Other Ambulatory Visit: Payer: Self-pay | Admitting: Cardiology

## 2017-02-13 ENCOUNTER — Encounter (HOSPITAL_COMMUNITY): Payer: Self-pay | Admitting: Oncology

## 2017-02-13 ENCOUNTER — Encounter (HOSPITAL_COMMUNITY): Payer: Medicare Other | Attending: Oncology | Admitting: Oncology

## 2017-02-13 ENCOUNTER — Encounter (HOSPITAL_COMMUNITY): Payer: Medicare Other

## 2017-02-13 ENCOUNTER — Other Ambulatory Visit: Payer: Self-pay | Admitting: Nurse Practitioner

## 2017-02-13 VITALS — BP 131/46 | HR 66 | Resp 18 | Ht 66.0 in | Wt 243.0 lb

## 2017-02-13 DIAGNOSIS — D508 Other iron deficiency anemias: Secondary | ICD-10-CM

## 2017-02-13 DIAGNOSIS — D509 Iron deficiency anemia, unspecified: Secondary | ICD-10-CM | POA: Diagnosis present

## 2017-02-13 LAB — CBC WITH DIFFERENTIAL/PLATELET
Basophils Absolute: 0 10*3/uL (ref 0.0–0.1)
Basophils Relative: 1 %
EOS ABS: 0.1 10*3/uL (ref 0.0–0.7)
EOS PCT: 2 %
HCT: 38.2 % (ref 36.0–46.0)
HEMOGLOBIN: 12.2 g/dL (ref 12.0–15.0)
LYMPHS ABS: 1.7 10*3/uL (ref 0.7–4.0)
LYMPHS PCT: 30 %
MCH: 31.8 pg (ref 26.0–34.0)
MCHC: 31.9 g/dL (ref 30.0–36.0)
MCV: 99.5 fL (ref 78.0–100.0)
MONO ABS: 0.5 10*3/uL (ref 0.1–1.0)
Monocytes Relative: 9 %
Neutro Abs: 3.2 10*3/uL (ref 1.7–7.7)
Neutrophils Relative %: 58 %
Platelets: 192 10*3/uL (ref 150–400)
RBC: 3.84 MIL/uL — AB (ref 3.87–5.11)
RDW: 15.1 % (ref 11.5–15.5)
WBC: 5.5 10*3/uL (ref 4.0–10.5)

## 2017-02-13 LAB — IRON AND TIBC
IRON: 86 ug/dL (ref 28–170)
SATURATION RATIOS: 25 % (ref 10.4–31.8)
TIBC: 344 ug/dL (ref 250–450)
UIBC: 258 ug/dL

## 2017-02-13 LAB — COMPREHENSIVE METABOLIC PANEL
ALK PHOS: 53 U/L (ref 38–126)
ALT: 13 U/L — AB (ref 14–54)
AST: 21 U/L (ref 15–41)
Albumin: 3.5 g/dL (ref 3.5–5.0)
Anion gap: 10 (ref 5–15)
BILIRUBIN TOTAL: 0.4 mg/dL (ref 0.3–1.2)
BUN: 16 mg/dL (ref 6–20)
CALCIUM: 9 mg/dL (ref 8.9–10.3)
CO2: 26 mmol/L (ref 22–32)
CREATININE: 1.17 mg/dL — AB (ref 0.44–1.00)
Chloride: 101 mmol/L (ref 101–111)
GFR, EST AFRICAN AMERICAN: 55 mL/min — AB (ref >60)
GFR, EST NON AFRICAN AMERICAN: 47 mL/min — AB (ref >60)
Glucose, Bld: 243 mg/dL — ABNORMAL HIGH (ref 65–99)
Potassium: 4.4 mmol/L (ref 3.5–5.1)
Sodium: 137 mmol/L (ref 135–145)
Total Protein: 6.5 g/dL (ref 6.5–8.1)

## 2017-02-13 LAB — VITAMIN B12

## 2017-02-13 LAB — FOLATE: Folate: 38.6 ng/mL (ref >5.9)

## 2017-02-13 LAB — FERRITIN: FERRITIN: 19 ng/mL (ref 11–307)

## 2017-02-13 NOTE — Progress Notes (Signed)
[facility] [address], [place] [address]   CLINIC:  Medical Oncology/Hematology  PCP:  Gareth Morgan, MD [address] [place] [address] [phone]   REASON FOR VISIT:  Follow-up for iron-deficiency anemia   CURRENT THERAPY: IV iron prn    HISTORY OF PRESENT ILLNESS:  (From Jenita Seashore, PA-C's last note on 12/31/15)   Previous IV iron administration:  Oncology Flowsheet 01/02/2015  ferumoxytol [facility]) IV 510 mg   Oncology Flowsheet 03/23/2015 03/30/2015 07/10/2015 07/17/2015  ferric gluconate (NULECIT) IV 125 mg 125 mg 125 mg 125 mg   Oncology Flowsheet 01/19/2016  ferumoxytol (FERAHEME) IV 510 mg    INTERVAL HISTORY:  [name] for follow-up for iron-deficiency anemia. She states that she is doing well. She's been doing physical therapy for the past few weeks. She denies noting any GI bleeding including melena and hematochezia, and denies any other sources of bleeding. She is currently not on any oral iron supplements.    REVIEW OF SYSTEMS:  Review of Systems  Constitutional: Positive for fatigue. Negative for appetite change, diaphoresis and fever.  HENT:   Negative for sore throat.   Respiratory: Negative for cough.   Cardiovascular: Negative.  Negative for chest pain and palpitations.  Gastrointestinal: Negative.  Negative for abdominal pain, blood in stool, constipation, diarrhea, nausea and vomiting.  Genitourinary: Negative.  Negative for hematuria and vaginal bleeding.   Musculoskeletal: Negative.   Skin: Negative.   Neurological: Negative.   Hematological: Negative.  Does not bruise/bleed easily.  Psychiatric/Behavioral: Negative.      PAST MEDICAL/SURGICAL HISTORY:  Past Medical History:  Diagnosis Date  . Anemia    H/H of 10/30 with a normal MCV in 12/09  . Anxiety   . Arthritis   . Barrett's esophagus    Diagnosed 1995. Last EGD 2016-NO BARRETT'S.   . Chest pain    Negative cardiac catheterization in  2002; negative stress nuclear study in 2008  . Chronic anticoagulation   . Chronic diastolic CHF (congestive heart failure) (HCC)    a. EF predominantly normal during prior echoes but was 40% during 10/2014 echo. b. Most recent 01/2015 EF normal, 55-60%.  . Chronic LBP    Surgical intervention in 1996  . Diabetes mellitus, type 2 (HCC)    Insulin therapy; exacerbated by prednisone  . Dysrhythmia    AFib  . Gastroparesis    99% retention 05/2008 on GES  . GERD (gastroesophageal reflux disease)   . Hiatal hernia   . Hyperlipidemia   . Hypertension   . Hypothyroid   . IBS (irritable bowel syndrome)   . Obesity   . OSA on CPAP    had CPAP and cannot tolerate.  . Paroxysmal atrial fibrillation (HCC)   . Pulmonary hypertension (HCC) 01/2015   a. Predominantly pulmonary venous hypertension but may be component of PAH.  . Seizures (HCC)    last seizure was 2 years ago; on keppra for this; unknown etiology  . Syncope    a. Admitted 05/2009; magnetic resonance imagin/ MRA - negative; etiology thought to be orthostasis secondary to drugs and dehydration. b. Syncope 02/2015 also felt 2/2 dehydration.   Past Surgical History:  Procedure Laterality Date  . BACK SURGERY  1996  . BIOPSY N/A 11/08/2013   Procedure: BIOPSY  / Tissue sampling / ulcers present in small intestine;  Surgeon: West Bali, MD;  Location: [facility];  Service: Endoscopy;  Laterality: N/A;  . CARDIAC CATHETERIZATION  2002  . CARDIAC CATHETERIZATION N/A 01/26/2015   Procedure: Right Heart Cath;  Surgeon: Laurey Morale, MD;  Location: [facility] INVASIVE CV LAB;  Service: Cardiovascular;  Laterality: N/A;  . CARDIOVASCULAR STRESS TEST  2008   Stress nuclear study  . CARDIOVERSION N/A 03/06/2015   Procedure: CARDIOVERSION;  Surgeon: Laurey Morale, MD;  Location: [facility] ENDOSCOPY;  Service: Cardiovascular;  Laterality: N/A;  . CARPAL TUNNEL RELEASE  1994  . COLONOSCOPY  11/2011   Dr. Darrick Penna: Internal hemorrhoids, mild  diverticulosis. Random colon biopsies negative.  . COLONOSCOPY N/A 11/08/2013   SLF: Normal mucosa in the terminal ileum/The colon IS redundant/  Moderate diverticulosis throughout the entire colon. ileum bx benign. colon bx benign  . ESOPHAGOGASTRODUODENOSCOPY  2008   Barrett's without dysplasia. esphagus dilated. antral erosions, h.pylori serologies negative.  . ESOPHAGOGASTRODUODENOSCOPY  11/2011   Dr. Darrick Penna: Barrett's esophagus, mild gastritis, diverticulum in the second portion of the duodenum repeat EGD 3 years. Small bowel biopsies negative. Gastric biopsy show reactive gastropathy but no H. pylori. Esophageal biopsies consistent with GERD. Next EGD 11/2014  . ESOPHAGOGASTRODUODENOSCOPY N/A 11/21/2014   RUE:AVWU non-erosive gastritis/irregular z-line  . GIVENS CAPSULE STUDY  12/07/2011   Proximal small bowel, rare AVM. Distal small bowel, multiple ulcers noted  . GIVENS CAPSULE STUDY N/A 09/27/2013   Distal small bowel ulcers extending to TI.  Marland Kitchen GIVENS CAPSULE STUDY N/A 10/10/2013   Procedure: GIVENS CAPSULE STUDY;  Surgeon: West Bali, MD;  Location: [facility];  Service: Endoscopy;  Laterality: N/A;  7:30  . KNEE ARTHROSCOPY WITH MEDIAL MENISECTOMY Right 06/09/2016   Procedure: KNEE ARTHROSCOPY WITH MEDIAL MENISECTOMY;  Surgeon: Vickki Hearing, MD;  Location: [facility] ORS;  Service: Orthopedics;  Laterality: Right;  medial and lateral menisectomy  . LAMINECTOMY  1995   L4-L5  . LAPAROSCOPIC CHOLECYSTECTOMY  1990s  . LEFT HEART CATHETERIZATION WITH CORONARY ANGIOGRAM  01/10/2014   Procedure: LEFT HEART CATHETERIZATION WITH CORONARY ANGIOGRAM;  Surgeon: Lesleigh Noe, MD;  Location: [facility] CATH LAB;  Service: Cardiovascular;;  . RIGHT HEART CATHETERIZATION N/A 01/10/2014   Procedure: RIGHT HEART CATH;  Surgeon: Lesleigh Noe, MD;  Location: [facility] CATH LAB;  Service: Cardiovascular;  Laterality: N/A;  . TOTAL ABDOMINAL HYSTERECTOMY  1999     SOCIAL HISTORY:  Social History   Social  History  . Marital status: Married    Spouse name: N/A  . Number of children: N/A  . Years of education: N/A   Occupational History  . 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 Smoking status: Former Smoker    Packs/day: 0.25    Years: 15.00    Types: Cigarettes    Start date: 02/26/1966    Quit date: 07/01/1983  . Smokeless tobacco: Never Used     Comment: quit in 1984  . Alcohol use No     Comment: last etoh one year ago, never frequent  . Drug use: No  . Sexual activity: Yes    Birth control/ protection: None, Surgical   Other Topics Concern  . Not on file   Social History Narrative   Sedentary   4 children, "blended family"    FAMILY HISTORY:  Family History  Problem Relation Age of Onset  . Hypertension Mother   . Alzheimer's disease Mother   . Stroke Mother   . Heart attack Mother   . Hypertension Other   .  Breast cancer Sister   . Heart disease Neg Hx   . Colon cancer Neg Hx     CURRENT MEDICATIONS:  Outpatient Encounter Prescriptions as of 02/13/2017  Medication Sig Note  . acetaminophen (TYLENOL) 500 MG tablet Take 500 mg by mouth every 6 (six) hours as needed for moderate pain or headache.   . albuterol (PROVENTIL HFA;VENTOLIN HFA) 108 (90 Base) MCG/ACT inhaler Inhale 2 puffs into the lungs every 4 (four) hours as needed for wheezing or shortness of breath.   Marland Kitchen amiodarone (PACERONE) 200 MG tablet TAKE 1 TABLET BY MOUTH EVERY DAY( pt needs office visit for further refills)   . amiodarone (PACERONE) 200 MG tablet TAKE 1 TABLET BY MOUTH EVERY MORNING   . apixaban (ELIQUIS) 5 MG TABS tablet Take 1 tablet (5 mg total) by mouth 2 (two) times daily.   Marland Kitchen dicyclomine (BENTYL) 10 MG capsule TAKE 1 TO 2 CAPSULES BY MOUTH BEFORE MEALS AS NEEDED FOR ABDOMINAL CRAMPING AND LOOSE STOOLS   . DULoxetine (CYMBALTA) 30 MG capsule Take 30 mg by mouth 2 (two) times daily.   . famotidine (PEPCID) 20 MG tablet Take 20 mg by mouth at  bedtime.    . furosemide (LASIX) 40 MG tablet Take 1 tablet (40 mg total) by mouth daily. (Patient taking differently: Take 20 mg by mouth daily. )   . gabapentin (NEURONTIN) 300 MG capsule Take 300 mg by mouth 3 (three) times daily.   Marland Kitchen glipiZIDE (GLUCOTROL) 10 MG tablet Take 10 mg by mouth 2 (two) times daily before a meal.    . Insulin Glargine (LANTUS SOLOSTAR) 100 UNIT/ML Solostar Pen Inject 20-50 Units into the skin at bedtime as needed. Patient takes 20 units every night at bedtime if over 178.  Takes 50 units if her blood sugar is above 300. 06/09/2016: Took 20 units last night  . levETIRAcetam (KEPPRA) 750 MG tablet Take 750 mg by mouth 2 (two) times daily.   Marland Kitchen levothyroxine (SYNTHROID, LEVOTHROID) 137 MCG tablet Take 137 mcg by mouth daily.   Marland Kitchen loperamide (IMODIUM) 2 MG capsule TAKE 1 CAPSULE BY MOUTH AS NEEDED FOR DIARRHEA OR LOOSE STOOLS   . LORazepam (ATIVAN) 1 MG tablet Take 1 mg by mouth 3 (three) times daily as needed for anxiety or sleep. For anxiety 12/25/2015: Normally takes at bedtime  . magnesium oxide (MAG-OX) 400 MG tablet Take 400 mg by mouth daily.    . metaxalone (SKELAXIN) 800 MG tablet Take 800 mg by mouth 3 (three) times daily.    . metFORMIN (GLUCOPHAGE) 500 MG tablet Take 1,000 mg by mouth 2 (two) times daily with a meal.    . Multiple Vitamin (MULTIVITAMIN WITH MINERALS) TABS tablet Take 1 tablet by mouth daily.   . nitroGLYCERIN (NITROSTAT) 0.4 MG SL tablet Place 0.4 mg under the tongue every 5 (five) minutes as needed for chest pain. Reported on 07/08/2015   . ondansetron (ZOFRAN) 4 MG tablet TAKE ONE TABLET BY MOUTH FOUR TIMES DAILY FOR NAUSEA - BEFORE MEALS AND AT BEDTIME   . pantoprazole (PROTONIX) 40 MG tablet TAKE ONE TABLET BY MOUTH TWICE DAILY BEFORE APPLY MEAL   . potassium chloride (K-DUR,KLOR-CON) 10 MEQ tablet TAKE TWO TABLETS BY MOUTH TWICE DAILY   . promethazine (PHENERGAN) 12.5 MG tablet Take 1 tablet (12.5 mg total) by mouth every 4 (four) hours as needed  for nausea or vomiting.   . vitamin B-12 (CYANOCOBALAMIN) 500 MCG tablet Take 500 mcg by mouth daily.    No  facility-administered encounter medications on file as of 02/13/2017.     ALLERGIES:  Allergies  Allergen Reactions  . Ibuprofen Other (See Comments)    Upsets her stomach and causes abdominal pain  . Codeine Nausea And Vomiting and Other (See Comments)    HALLUCINATIONS  . Dilaudid [Hydromorphone Hcl] Other (See Comments)    Made her pass out  . Celexa [Citalopram Hydrobromide] Other (See Comments)    Dyskinesia  . Reglan [Metoclopramide] Other (See Comments)    DYSKINESIA     PHYSICAL EXAM:  ECOG Performance status: 1 - Symptomatic, but independent.   Vitals:   02/13/17 1109  BP: (!) 131/46  Pulse: 66  Resp: 18  SpO2: 94%  Unable to get temperature per RN  Filed Weights   02/13/17 1109  Weight: 243 lb (110.2 kg)    Physical Exam  Constitutional: She is oriented to person, place, and time and well-developed, well-nourished, and in no distress.  HENT:  Head: Normocephalic.  Eyes: Pupils are equal, round, and reactive to light. Conjunctivae are normal. No scleral icterus.  Neck: Normal range of motion.  Cardiovascular: Normal rate, regular rhythm and normal heart sounds.   Pulmonary/Chest: Effort normal. No respiratory distress. She has no wheezes. She has no rales.  Abdominal: Soft. Bowel sounds are normal. There is no tenderness. There is no guarding.  Musculoskeletal: Normal range of motion. She exhibits no edema.  Lymphadenopathy:    She has no cervical adenopathy.  Neurological: She is alert and oriented to person, place, and time. No cranial nerve deficit. Gait normal.  Skin: Skin is warm and dry. No rash noted.  Psychiatric: Mood, memory, affect and judgment normal.     LABORATORY DATA:  I have reviewed the labs as listed.  CBC    Component Value Date/Time   WBC 5.5 02/13/2017 1036   RBC 3.84 (L) 02/13/2017 1036   HGB 12.2 02/13/2017 1036    HCT 38.2 02/13/2017 1036   PLT 192 02/13/2017 1036   MCV 99.5 02/13/2017 1036   MCH 31.8 02/13/2017 1036   MCHC 31.9 02/13/2017 1036   RDW 15.1 02/13/2017 1036   LYMPHSABS 1.7 02/13/2017 1036   MONOABS 0.5 02/13/2017 1036   EOSABS 0.1 02/13/2017 1036   BASOSABS 0.0 02/13/2017 1036   CMP Latest Ref Rng & Units 02/13/2017 08/19/2016 08/16/2016  Glucose 65 - 99 mg/dL 161(W) 960(A) 540(J)  BUN 6 - 20 mg/dL 16 13 15   Creatinine 0.44 - 1.00 mg/dL 8.11(B) 1.47 8.29(F)  Sodium 135 - 145 mmol/L 137 137 137  Potassium 3.5 - 5.1 mmol/L 4.4 4.1 4.6  Chloride 101 - 111 mmol/L 101 106 103  CO2 22 - 32 mmol/L 26 25 27   Calcium 8.9 - 10.3 mg/dL 9.0 6.2(Z) 9.1  Total Protein 6.5 - 8.1 g/dL 6.5 - -  Total Bilirubin 0.3 - 1.2 mg/dL 0.4 - -  Alkaline Phos 38 - 126 U/L 53 - -  AST 15 - 41 U/L 21 - -  ALT 14 - 54 U/L 13(L) - -    PENDING LABS:    DIAGNOSTIC IMAGING:  Last EGD: 11/21/14   Last colonoscopy: 11/08/13    PATHOLOGY:  Colonoscopy path: 11/08/13     ASSESSMENT & PLAN:   Iron-deficiency anemia:  -Hemoglobin 12.2 g/dL for today;will follow up on her iron studies.  -She could not tolerate oral iron supplementation.  Will consider bringing her in for IV iron as needed to maintain ferritin > 100.  -Return to cancer center in  6 months with labs.      Dispo:  -Return to cancer center in 6 months with labs.  Orders Placed This Encounter  Procedures  . CBC with Differential    Standing Status:   Future    Standing Expiration Date:   02/13/2018  . Comprehensive metabolic panel    Standing Status:   Future    Standing Expiration Date:   02/13/2018  . Iron and TIBC    Standing Status:   Future    Standing Expiration Date:   02/13/2018  . Ferritin    Standing Status:   Future    Standing Expiration Date:   02/13/2018  . Vitamin B12    Standing Status:   Future    Standing Expiration Date:   02/13/2018  . Folate    Standing Status:   Future    Standing Expiration Date:   02/13/2018      All questions were answered to patient's stated satisfaction. Encouraged patient to call with any new concerns or questions before her next visit to the cancer center and we can certain see her sooner, if needed.     Ralene Cork, MD

## 2017-02-16 ENCOUNTER — Other Ambulatory Visit (HOSPITAL_COMMUNITY): Payer: Self-pay | Admitting: Cardiology

## 2017-02-16 ENCOUNTER — Telehealth: Payer: Self-pay | Admitting: Cardiology

## 2017-02-16 MED ORDER — AMIODARONE HCL 200 MG PO TABS: 200.0000 mg | ORAL_TABLET | Freq: Every morning | ORAL | 0 refills | Status: DC

## 2017-02-16 MED ORDER — POTASSIUM CHLORIDE CRYS ER 10 MEQ PO TBCR: EXTENDED_RELEASE_TABLET | ORAL | 0 refills | Status: DC

## 2017-02-16 NOTE — Telephone Encounter (Signed)
Refill for 30 day complete sent. Further to be done at office visit.

## 2017-02-16 NOTE — Telephone Encounter (Signed)
°  1. Which medications need to be refilled? (please list name of each medication and dose if known)  amiodarone (PACERONE) 200 MG tablet [[id number]]  potassium chloride (K-DUR,KLOR-CON) 10 MEQ tablet [[id number]]    2. Which pharmacy/location (including street and city if local pharmacy) is medication to be sent to? [name]  3. Do they need a 30 day or 90 day supply? 90 day  Pt is scheduled to see Dr. Wyline Mood on 03/24/17

## 2017-03-08 ENCOUNTER — Other Ambulatory Visit: Payer: Self-pay | Admitting: Nurse Practitioner

## 2017-03-24 ENCOUNTER — Ambulatory Visit (INDEPENDENT_AMBULATORY_CARE_PROVIDER_SITE_OTHER): Payer: Medicare Other | Admitting: Cardiology

## 2017-03-24 ENCOUNTER — Encounter: Payer: Self-pay | Admitting: Cardiology

## 2017-03-24 VITALS — BP 120/70 | HR 78 | Ht 66.0 in | Wt 251.0 lb

## 2017-03-24 DIAGNOSIS — I5043 Acute on chronic combined systolic (congestive) and diastolic (congestive) heart failure: Secondary | ICD-10-CM

## 2017-03-24 DIAGNOSIS — I4891 Unspecified atrial fibrillation: Secondary | ICD-10-CM | POA: Diagnosis not present

## 2017-03-24 DIAGNOSIS — I272 Pulmonary hypertension, unspecified: Secondary | ICD-10-CM

## 2017-03-24 DIAGNOSIS — Z79899 Other long term (current) drug therapy: Secondary | ICD-10-CM

## 2017-03-24 MED ORDER — AMIODARONE HCL 200 MG PO TABS: 200.0000 mg | ORAL_TABLET | Freq: Every morning | ORAL | 3 refills | Status: DC

## 2017-03-24 MED ORDER — FUROSEMIDE 40 MG PO TABS: ORAL_TABLET | ORAL | 3 refills | Status: DC

## 2017-03-24 NOTE — Patient Instructions (Addendum)
Medication Instructions:  INCREASE LASIX TO 40 MG DAILY  Labwork: 1 1/2 WEEKS (OCTOBER 2-3) BMET  Testing/Procedures: NONE  Follow-Up: Your physician recommends that you schedule a follow-up appointment in: 2 WEEKS   Any Other Special Instructions Will Be Listed Below (If Applicable). CALL IN 1 WEEK WITH WEIGHTS     If you need a refill on your cardiac medications before your next appointment, please call your pharmacy.

## 2017-03-24 NOTE — Progress Notes (Signed)
Clinical Summary Ms. Muramoto is a 66 y.o.female seen today for follow up of the following medical problems.   1. Chronicsystolic/ diastolic CHF with mixed precap and post postcap Pulmonary Hypertension - echo 12/2013 PASP 85, moderate TR, RV mild to moderately dilated with normal function, could not eval diasotlic function due to afib, LVEF 50-55%. Biatrial enlargement.  - RHC 01/2015 PA 38/13 and calculated mean of 21, could not get a wedge however LVEDP 15. - repeat echo 10/2014 PASP 61 mmHg (down from 85 on prior echo), some evidence of RV dysfunction with RV TAPSE 1.4 and tissue anular systolic velocity of 7.5. Cannot evaluate diastolic function, however severe biatrial enlarement suggests significant dysfunction, - 01/2015 RHC mean PA 52, PWCP 31 - 01/2015 echo LVEF 55-60%, PASP 46  - after most recent RHC in 01/2015 she was admitted for diuresis. Discharge weight 170 lbs, reportedly down from 190 on admission. Marland Kitchen Discharged on lasix  bid.    - weights Feb of this year 209. Now up to 232 lbs. Has noted some recent LE edema, R>L which is chronic. - she is taking lasix  daily. In the past had issues with dehydration and overally aggressive diuretics have been avoided.  - weight 02/2016 appt 232 lbs.   - home weights around 238-246 lbs.She has had some increased in LE edema.  - limiting sodium intake    2. Afib - no recent palpitations. Mainly occurs with standing.  - had some nose bleeds last week in setting of allergies but resolved. She remains on anticoagulation.  3. Ascites - followed by GI, recent US 10/2014 with scattered mild ascites without large pocket. 08/2014 CT scan    Past Medical History:  Diagnosis Date  . Anemia    H/H of 10/30 with a normal MCV in 12/09  . Anxiety   . Arthritis   . Barrett's esophagus    Diagnosed 1995. Last EGD 2016-NO BARRETT'S.   . Chest pain    Negative cardiac catheterization in 2002; negative stress nuclear study in 2008    . Chronic anticoagulation   . Chronic diastolic CHF (congestive heart failure) (HCC)    a. EF predominantly normal during prior echoes but was 40% during 10/2014 echo. b. Most recent 01/2015 EF normal, 55-60%.  . Chronic LBP    Surgical intervention in 1996  . Diabetes mellitus, type 2 (HCC)    Insulin therapy; exacerbated by prednisone  . Dysrhythmia    AFib  . Gastroparesis    99% retention 05/2008 on GES  . GERD (gastroesophageal reflux disease)   . Hiatal hernia   . Hyperlipidemia   . Hypertension   . Hypothyroid   . IBS (irritable bowel syndrome)   . Obesity   . OSA on CPAP    had CPAP and cannot tolerate.  . Paroxysmal atrial fibrillation (HCC)   . Pulmonary hypertension (HCC) 01/2015   a. Predominantly pulmonary venous hypertension but may be component of PAH.  . Seizures (HCC)    last seizure was 2 years ago; on keppra for this; unknown etiology  . Syncope    a. Admitted 05/2009; magnetic resonance imagin/ MRA - negative; etiology thought to be orthostasis secondary to drugs and dehydration. b. Syncope 02/2015 also felt 2/2 dehydration.     Allergies  Allergen Reactions  . Ibuprofen Other (See Comments)    Upsets her stomach and causes abdominal pain  . Codeine Nausea And Vomiting and Other (See Comments)    HALLUCINATIONS  .  Dilaudid [Hydromorphone Hcl] Other (See Comments)    Made her pass out  . Celexa [Citalopram Hydrobromide] Other (See Comments)    Dyskinesia  . Reglan [Metoclopramide] Other (See Comments)    DYSKINESIA     Current Outpatient Prescriptions  Medication Sig Dispense Refill  . acetaminophen (TYLENOL) 500 MG tablet Take 500 mg by mouth every 6 (six) hours as needed for moderate pain or headache.    . albuterol (PROVENTIL HFA;VENTOLIN HFA) 108 (90 Base) MCG/ACT inhaler Inhale 2 puffs into the lungs every 4 (four) hours as needed for wheezing or shortness of breath. 1 Inhaler 0  . amiodarone (PACERONE) 200 MG tablet Take 1 tablet (200 mg total)  by mouth every morning. 30 tablet 0  . apixaban (ELIQUIS) 5 MG TABS tablet Take 1 tablet (5 mg total) by mouth 2 (two) times daily. 60 tablet 0  . dicyclomine (BENTYL) 10 MG capsule TAKE 1 TO 2 CAPSULES BY MOUTH BEFORE MEALS AS NEEDED abdominal cramping AND loose stools 180 capsule 1  . DULoxetine (CYMBALTA) 30 MG capsule Take 30 mg by mouth 2 (two) times daily.    . famotidine (PEPCID) 20 MG tablet Take 20 mg by mouth at bedtime.     . furosemide (LASIX) 40 MG tablet Take 1 tablet (40 mg total) by mouth daily. (Patient taking differently: Take 20 mg by mouth daily. ) 30 tablet 0  . gabapentin (NEURONTIN) 300 MG capsule Take 300 mg by mouth 3 (three) times daily.    Marland Kitchen glipiZIDE (GLUCOTROL) 10 MG tablet Take 10 mg by mouth 2 (two) times daily before a meal.     . Insulin Glargine (LANTUS SOLOSTAR) 100 UNIT/ML Solostar Pen Inject 20-50 Units into the skin at bedtime as needed. Patient takes 20 units every night at bedtime if over 178.  Takes 50 units if her blood sugar is above 300.    . levETIRAcetam (KEPPRA) 750 MG tablet Take 750 mg by mouth 2 (two) times daily.    Marland Kitchen levothyroxine (SYNTHROID, LEVOTHROID) 137 MCG tablet Take 137 mcg by mouth daily.    Marland Kitchen loperamide (IMODIUM) 2 MG capsule Take 1 capsule (2 mg total) by mouth 2 (two) times daily as needed for diarrhea or loose stools. 60 capsule 2  . LORazepam (ATIVAN) 1 MG tablet Take 1 mg by mouth 3 (three) times daily as needed for anxiety or sleep. For anxiety    . magnesium oxide (MAG-OX) 400 MG tablet Take 400 mg by mouth daily.     . metaxalone (SKELAXIN) 800 MG tablet Take 800 mg by mouth 3 (three) times daily.     . metFORMIN (GLUCOPHAGE) 500 MG tablet Take 1,000 mg by mouth 2 (two) times daily with a meal.     . Multiple Vitamin (MULTIVITAMIN WITH MINERALS) TABS tablet Take 1 tablet by mouth daily.    . nitroGLYCERIN (NITROSTAT) 0.4 MG SL tablet Place 0.4 mg under the tongue every 5 (five) minutes as needed for chest pain. Reported on  07/08/2015    . ondansetron (ZOFRAN) 4 MG tablet TAKE ONE TABLET BY MOUTH FOUR TIMES DAILY FOR NAUSEA - BEFORE MEALS AND AT BEDTIME 120 tablet 3  . pantoprazole (PROTONIX) 40 MG tablet TAKE ONE TABLET BY MOUTH TWICE DAILY BEFORE APPLY MEAL 180 tablet 3  . potassium chloride (K-DUR,KLOR-CON) 10 MEQ tablet TAKE TWO TABLETS BY MOUTH TWICE DAILY - pt. needs APPOINTMENT. FOR further refills 60 tablet 0  . promethazine (PHENERGAN) 12.5 MG tablet Take 1 tablet (12.5 mg total)  by mouth every 4 (four) hours as needed for nausea or vomiting. 42 tablet 0  . vitamin B-12 (CYANOCOBALAMIN) 500 MCG tablet Take 500 mcg by mouth daily.     No current facility-administered medications for this visit.      Past Surgical History:  Procedure Laterality Date  . BACK SURGERY  1996  . BIOPSY N/A 11/08/2013   Procedure: BIOPSY  / Tissue sampling / ulcers present in small intestine;  Surgeon: West Bali, MD;  Location: [facility];  Service: Endoscopy;  Laterality: N/A;  . CARDIAC CATHETERIZATION  2002  . CARDIAC CATHETERIZATION N/A 01/26/2015   Procedure: Right Heart Cath;  Surgeon: Laurey Morale, MD;  Location: [facility] INVASIVE CV LAB;  Service: Cardiovascular;  Laterality: N/A;  . CARDIOVASCULAR STRESS TEST  2008   Stress nuclear study  . CARDIOVERSION N/A 03/06/2015   Procedure: CARDIOVERSION;  Surgeon: Laurey Morale, MD;  Location[facility] ENDOSCOPY;  Service: Cardiovascular;  Laterality: N/A;  . CARPAL TUNNEL RELEASE  1994  . COLONOSCOPY  11/2011   Dr. Darrick Penna: Internal hemorrhoids, mild diverticulosis. Random colon biopsies negative.  . COLONOSCOPY N/A 11/08/2013   SLF: Normal mucosa in the terminal ileum/The colon IS redundant/  Moderate diverticulosis throughout the entire colon. ileum bx benign. colon bx benign  . ESOPHAGOGASTRODUODENOSCOPY  2008   Barrett's without dysplasia. esphagus dilated. antral erosions, h.pylori serologies negative.  . ESOPHAGOGASTRODUODENOSCOPY  11/2011   Dr. Darrick Penna: Barrett's esophagus,  mild gastritis, diverticulum in the second portion of the duodenum repeat EGD 3 years. Small bowel biopsies negative. Gastric biopsy show reactive gastropathy but no H. pylori. Esophageal biopsies consistent with GERD. Next EGD 11/2014  . ESOPHAGOGASTRODUODENOSCOPY N/A 11/21/2014   GNF:AOZH non-erosive gastritis/irregular z-line  . GIVENS CAPSULE STUDY  12/07/2011   Proximal small bowel, rare AVM. Distal small bowel, multiple ulcers noted  . GIVENS CAPSULE STUDY N/A 09/27/2013   Distal small bowel ulcers extending to TI.  Marland Kitchen GIVENS CAPSULE STUDY N/A 10/10/2013   Procedure: GIVENS CAPSULE STUDY;  Surgeon: West Bali, MD;  Location: [facility];  Service: Endoscopy;  Laterality: N/A;  7:30  . KNEE ARTHROSCOPY WITH MEDIAL MENISECTOMY Right 06/09/2016   Procedure: KNEE ARTHROSCOPY WITH MEDIAL MENISECTOMY;  Surgeon: Vickki Hearing, MD;  Location: [facility] ORS;  Service: Orthopedics;  Laterality: Right;  medial and lateral menisectomy  . LAMINECTOMY  1995   L4-L5  . LAPAROSCOPIC CHOLECYSTECTOMY  1990s  . LEFT HEART CATHETERIZATION WITH CORONARY ANGIOGRAM  01/10/2014   Procedure: LEFT HEART CATHETERIZATION WITH CORONARY ANGIOGRAM;  Surgeon: Lesleigh Noe, MD;  Location: [facility] CATH LAB;  Service: Cardiovascular;;  . RIGHT HEART CATHETERIZATION N/A 01/10/2014   Procedure: RIGHT HEART CATH;  Surgeon: Lesleigh Noe, MD;  Location: [facility] CATH LAB;  Service: Cardiovascular;  Laterality: N/A;  . TOTAL ABDOMINAL HYSTERECTOMY  1999     Allergies  Allergen Reactions  . Ibuprofen Other (See Comments)    Upsets her stomach and causes abdominal pain  . Codeine Nausea And Vomiting and Other (See Comments)    HALLUCINATIONS  . Dilaudid [Hydromorphone Hcl] Other (See Comments)    Made her pass out  . Celexa [Citalopram Hydrobromide] Other (See Comments)    Dyskinesia  . Reglan [Metoclopramide] Other (See Comments)    DYSKINESIA      Family History  Problem Relation Age of Onset  . Hypertension Mother     . Alzheimer's disease Mother   . Stroke Mother   . Heart attack Mother   .  Hypertension Other   . Breast cancer Sister   . Heart disease Neg Hx   . Colon cancer Neg Hx      Social History Ms. Sieloff reports that she quit smoking about 33 years ago. Her smoking use included Cigarettes. She started smoking about 51 years ago. She has a 3.75 pack-year smoking history. She has never used smokeless tobacco. Ms. Lynne reports that she does not drink alcohol.   Review of Systems CONSTITUTIONAL: No weight loss, fever, chills, weakness or fatigue.  HEENT: Eyes: No visual loss, blurred vision, double vision or yellow sclerae.No hearing loss, sneezing, congestion, runny nose or sore throat.  SKIN: No rash or itching.  CARDIOVASCULAR: per hpi RESPIRATORY: per hpi GASTROINTESTINAL: No anorexia, nausea, vomiting or diarrhea. No abdominal pain or blood.  GENITOURINARY: No burning on urination, no polyuria NEUROLOGICAL: No headache, dizziness, syncope, paralysis, ataxia, numbness or tingling in the extremities. No change in bowel or bladder control.  MUSCULOSKELETAL: No muscle, back pain, joint pain or stiffness.  LYMPHATICS: No enlarged nodes. No history of splenectomy.  PSYCHIATRIC: No history of depression or anxiety.  ENDOCRINOLOGIC: No reports of sweating, cold or heat intolerance. No polyuria or polydipsia.  Marland Kitchen   Physical Examination Vitals:   03/24/17 1315  BP: 120/70  Pulse: 78  SpO2: (!) 89%   Vitals:   03/24/17 1315  Weight: 251 lb (113.9 kg)  Height:  (1.676 m)    Gen: resting comfortably, no acute distress HEENT: no scleral icterus, pupils equal round and reactive, no palptable cervical adenopathy,  CV: RRR, no m/r/g, no jvd Resp: faint crackels bilateral bases GI: abdomen is soft, non-tender, non-distended, normal bowel sounds, no hepatosplenomegaly MSK: extremities are warm, 1+ edema bilateral  Skin: warm, no rash Neuro:  no focal deficits Psych: appropriate  affect   Diagnostic Studies 12/2013 Echo Study Conclusions  - Limited study to evaluate LV systolic function. - Left ventricle: The cavity size was normal. Wall thickness was normal. Systolic function was normal. The estimated ejection fraction was in the range of 50% to 55%. Wall motion was normal; there were no regional wall motion abnormalities. The study was not technically sufficient to allow evaluation of LV diastolic dysfunction due to atrial fibrillation. - Aortic valve: Valve area (VTI): 2.49 cm^2. Valve area (Vmax): 2.11 cm^2. - Mitral valve: There was moderate regurgitation. The MR vena contracta is 0.4 cm. - Left atrium: The atrium was severely dilated. - Right ventricle: The cavity size was mildly to moderately dilated. - Right atrium: The atrium was severely dilated. - Atrial septum: From available images no evidence of ASD or PFO by 2D or color Doppler images. - Tricuspid valve: There was moderate regurgitation. - Pulmonary arteries: Systolic pressure was severely increased. PA peak pressure: 85 mm Hg (S).  01/2014 RHC HEMODYNAMICS: Aortic pressure 123/73 mmHg; LV pressure 122/6 mmHg; LVEDP 15 mm mercury; RA 4 mmHg; RV 31/5; PA 38/13 mmHg; PCWP(mean) unable to wedge the balloon tip catheter; Cardiac Output by Fick 4.73 L per minute  ANGIOGRAPHIC DATA: The left main coronary artery is normal.  The left anterior descending artery is widely patent, traverses the LV apex, gives origin to 2 diagonals, and has no significant obstruction..  The left circumflex artery is widely patent and gives a single large obtuse marginal branch that trifurcates on the lateral wall. No significant obstruction.  The right coronary artery is dominant, gives 3 left ventricular branches and a large bifurcating PDA. No obstructions noted.Marland Kitchen  LEFT VENTRICULOGRAM: Left ventricular  angiogram was done in the 30 RAO projection and revealed normal LV cavity size,  tachycardia due to atrial fibrillation, and mild global reduction in LV function with an estimated ejection fraction of 40%   IMPRESSIONS: 1. Widely patent coronary arteries 2. Mild pulmonary hypertension 3. Mildly depressed LV systolic function with an estimated EF of 40% 3. This did not confirm significant pulmonary hypertension. The procedure did suggest that the right atrium and right ventricle were enlarged as we had difficulty manipulating the right heart catheter into the pulmonary artery.   RECOMMENDATION: Back to the outpatient area at home later today if no complications. She should not resume eloquence until 24 hours post procedure..  10/2014 Echo Study Conclusions  - Left ventricle: The cavity size was normal. Wall thickness was increased in a pattern of mild LVH. There was mild concentric hypertrophy. The estimated ejection fraction was 40%. Diffuse hypokinesis. - Mitral valve: Calcified annulus. There was mild regurgitation. - Left atrium: The atrium was severely dilated. - Right atrium: The atrium was severely dilated. - Atrial septum: Thinned and stretched but no obvious PFO. No defect or patent foramen ovale was identified. - Tricuspid valve: There was moderate regurgitation. - Impressions: E/E&' not reported and in afib diastolic evaluation not possible.  Impressions:  - E/E&' not reported and in afib diastolic evaluation not possible.   01/2015 RHC    1. Severely elevated right and left heart filling pressures.  2. Primarily pulmonary venous hypertension (may be additional component of PAH with PVR 4.6 WU).   Plan:  I am going to admit her for IV diuresis.      Right Heart Pressures RHC Procedural Findings: Hemodynamics (mmHg) RA mean 23 RV 68/14 PA 70/36, mean 52 PCWP mean 31  Oxygen saturations: PA 60% AO 98%  Cardiac Output (Fick) 4.54  Cardiac Index (Fick) 2.32  PVR 4.6 WU       Assessment and Plan  1. Acute  on chronic systolic/diastolic HF with mixed pulmonary HTN - recent weight gain and increased edema - we will incresas lasix to  daily, check BMET/Mg in 2 weeks - asked to call us in 1 week to update [place] on weights.    2. Afib -  CHADS2Vasc score of 2, she will continue anticoagulation - continue current meds   F/u 2 weeks      Antoine Poche, M.D.

## 2017-03-27 LAB — MAGNESIUM: Magnesium: 1.7 mg/dL (ref 1.5–2.5)

## 2017-03-31 ENCOUNTER — Telehealth: Payer: Self-pay

## 2017-03-31 NOTE — Telephone Encounter (Signed)
Weights slowly trending down, we will continue current meds. Can she update [place] again this next week Friday   J Jabree Rebert MD

## 2017-03-31 NOTE — Telephone Encounter (Signed)
[name] daughter notifed, will call back with weights

## 2017-03-31 NOTE — Telephone Encounter (Signed)
Dr Wyline Mood wts: 9/22 246 lbs, 9/23 245 lbs, 9/24 244 lbs , 9/25 242 lbs, 9/26 242 lbs 9/27 240 lbs   Requested labs done by Dr [name], did not use our lab slips

## 2017-04-05 ENCOUNTER — Other Ambulatory Visit (HOSPITAL_COMMUNITY)
Admission: RE | Admit: 2017-04-05 | Discharge: 2017-04-05 | Disposition: A | Discharge status: Home / Self Care | Payer: Medicare Other | Source: Ambulatory Visit | Attending: Physician Assistant | Admitting: Physician Assistant

## 2017-04-05 ENCOUNTER — Encounter: Payer: Self-pay | Admitting: Physician Assistant

## 2017-04-05 ENCOUNTER — Ambulatory Visit (INDEPENDENT_AMBULATORY_CARE_PROVIDER_SITE_OTHER): Payer: Medicare Other | Admitting: Physician Assistant

## 2017-04-05 VITALS — BP 116/76 | HR 60 | Ht 66.0 in | Wt 242.0 lb

## 2017-04-05 DIAGNOSIS — I272 Pulmonary hypertension, unspecified: Secondary | ICD-10-CM | POA: Insufficient documentation

## 2017-04-05 DIAGNOSIS — I482 Chronic atrial fibrillation: Secondary | ICD-10-CM | POA: Diagnosis not present

## 2017-04-05 DIAGNOSIS — I10 Essential (primary) hypertension: Secondary | ICD-10-CM | POA: Insufficient documentation

## 2017-04-05 DIAGNOSIS — I4821 Permanent atrial fibrillation: Secondary | ICD-10-CM

## 2017-04-05 DIAGNOSIS — I5043 Acute on chronic combined systolic (congestive) and diastolic (congestive) heart failure: Secondary | ICD-10-CM | POA: Diagnosis not present

## 2017-04-05 LAB — BASIC METABOLIC PANEL
ANION GAP: 9 (ref 5–15)
BUN: 19 mg/dL (ref 6–20)
CHLORIDE: 99 mmol/L — AB (ref 101–111)
CO2: 30 mmol/L (ref 22–32)
Calcium: 9 mg/dL (ref 8.9–10.3)
Creatinine, Ser: 1.18 mg/dL — ABNORMAL HIGH (ref 0.44–1.00)
GFR calc non Af Amer: 46 mL/min — ABNORMAL LOW (ref >60)
GFR, EST AFRICAN AMERICAN: 54 mL/min — AB (ref >60)
Glucose, Bld: 155 mg/dL — ABNORMAL HIGH (ref 65–99)
POTASSIUM: 4.3 mmol/L (ref 3.5–5.1)
Sodium: 138 mmol/L (ref 135–145)

## 2017-04-05 NOTE — Progress Notes (Signed)
Cardiology Office Note    Date:  04/05/2017   ID:  [name] [birth year], MRN [id number]  PCP:  Gareth Morgan, MD  Cardiologist: Dr. Wyline Mood   Chief Complaint  Patient presents with  . Follow-up    History of Present Illness:  [name] is a 66 y.o. female history of chronic systolic/diastolic CHF, pulmonary hypertension, atrial fibrillation on , ascites followed by GI. Most recent echo 12/26/15 normal systolic function with grade 1 DD, elevated ventricular end-diastolic filling pressures, PA peak pressure 44 mmHg, left atrium moderately dilated.  Patient saw Dr. Wyline Mood 03/24/17 with acute on chronic systolic/diastolic CHF with weight gain up to 251 lbs and increased edema. Weight 02/2016 was 232 pounds He increased her Lasix to 40 mg daily.  Patient comes in today for two-week follow-up. She has lost 9 pounds. Her weight today is 242 pounds. On her scale she is 238 pounds. She says she is feeling better. She still has some edema.   Past Medical History:  Diagnosis Date  . Anemia    H/H of 10/30 with a normal MCV in 12/09  . Anxiety   . Arthritis   . Barrett's esophagus    Diagnosed 1995. Last EGD 2016-NO BARRETT'S.   . Chest pain    Negative cardiac catheterization in 2002; negative stress nuclear study in 2008  . Chronic anticoagulation   . Chronic diastolic CHF (congestive heart failure) (HCC)    a. EF predominantly normal during prior echoes but was 40% during 10/2014 echo. b. Most recent 01/2015 EF normal, 55-60%.  . Chronic LBP    Surgical intervention in 1996  . Diabetes mellitus, type 2 (HCC)    Insulin therapy; exacerbated by prednisone  . Dysrhythmia    AFib  . Gastroparesis    99% retention 05/2008 on GES  . GERD (gastroesophageal reflux disease)   . Hiatal hernia   . Hyperlipidemia   . Hypertension   . Hypothyroid   . IBS (irritable bowel syndrome)   . Obesity   . OSA on CPAP    had CPAP and cannot tolerate.  . Paroxysmal atrial fibrillation  (HCC)   . Pulmonary hypertension (HCC) 01/2015   a. Predominantly pulmonary venous hypertension but may be component of PAH.  . Seizures (HCC)    last seizure was 2 years ago; on keppra for this; unknown etiology  . Syncope    a. Admitted 05/2009; magnetic resonance imagin/ MRA - negative; etiology thought to be orthostasis secondary to drugs and dehydration. b. Syncope 02/2015 also felt 2/2 dehydration.    Past Surgical History:  Procedure Laterality Date  . BACK SURGERY  1996  . BIOPSY N/A 11/08/2013   Procedure: BIOPSY  / Tissue sampling / ulcers present in small intestine;  Surgeon: West Bali, MD;  Location: [facility];  Service: Endoscopy;  Laterality: N/A;  . CARDIAC CATHETERIZATION  2002  . CARDIAC CATHETERIZATION N/A 01/26/2015   Procedure: Right Heart Cath;  Surgeon: Laurey Morale, MD;  Location: [facility] INVASIVE CV LAB;  Service: Cardiovascular;  Laterality: N/A;  . CARDIOVASCULAR STRESS TEST  2008   Stress nuclear study  . CARDIOVERSION N/A 03/06/2015   Procedure: CARDIOVERSION;  Surgeon: Laurey Morale, MD;  Location: [facility] ENDOSCOPY;  Service: Cardiovascular;  Laterality: N/A;  . CARPAL TUNNEL RELEASE  1994  . COLONOSCOPY  11/2011   Dr. Darrick Penna: Internal hemorrhoids, mild diverticulosis. Random colon biopsies negative.  . COLONOSCOPY N/A 11/08/2013   SLF: Normal mucosa in  the terminal ileum/The colon IS redundant/  Moderate diverticulosis throughout the entire colon. ileum bx benign. colon bx benign  . ESOPHAGOGASTRODUODENOSCOPY  2008   Barrett's without dysplasia. esphagus dilated. antral erosions, h.pylori serologies negative.  . ESOPHAGOGASTRODUODENOSCOPY  11/2011   Dr. Darrick Penna: Barrett's esophagus, mild gastritis, diverticulum in the second portion of the duodenum repeat EGD 3 years. Small bowel biopsies negative. Gastric biopsy show reactive gastropathy but no H. pylori. Esophageal biopsies consistent with GERD. Next EGD 11/2014  . ESOPHAGOGASTRODUODENOSCOPY N/A 11/21/2014    ZOX:WRUE non-erosive gastritis/irregular z-line  . GIVENS CAPSULE STUDY  12/07/2011   Proximal small bowel, rare AVM. Distal small bowel, multiple ulcers noted  . GIVENS CAPSULE STUDY N/A 09/27/2013   Distal small bowel ulcers extending to TI.  Marland Kitchen GIVENS CAPSULE STUDY N/A 10/10/2013   Procedure: GIVENS CAPSULE STUDY;  Surgeon: West Bali, MD;  Location: [facility];  Service: Endoscopy;  Laterality: N/A;  7:30  . KNEE ARTHROSCOPY WITH MEDIAL MENISECTOMY Right 06/09/2016   Procedure: KNEE ARTHROSCOPY WITH MEDIAL MENISECTOMY;  Surgeon: Vickki Hearing, MD;  Location: [facility] ORS;  Service: Orthopedics;  Laterality: Right;  medial and lateral menisectomy  . LAMINECTOMY  1995   L4-L5  . LAPAROSCOPIC CHOLECYSTECTOMY  1990s  . LEFT HEART CATHETERIZATION WITH CORONARY ANGIOGRAM  01/10/2014   Procedure: LEFT HEART CATHETERIZATION WITH CORONARY ANGIOGRAM;  Surgeon: Lesleigh Noe, MD;  Location: [facility] CATH LAB;  Service: Cardiovascular;;  . RIGHT HEART CATHETERIZATION N/A 01/10/2014   Procedure: RIGHT HEART CATH;  Surgeon: Lesleigh Noe, MD;  Location: [facility] CATH LAB;  Service: Cardiovascular;  Laterality: N/A;  . TOTAL ABDOMINAL HYSTERECTOMY  1999    Current Medications: Current Meds  Medication Sig  . acetaminophen (TYLENOL) 500 MG tablet Take 500 mg by mouth every 6 (six) hours as needed for moderate pain or headache.  . albuterol (PROVENTIL HFA;VENTOLIN HFA) 108 (90 Base) MCG/ACT inhaler Inhale 2 puffs into the lungs every 4 (four) hours as needed for wheezing or shortness of breath.  Marland Kitchen amiodarone (PACERONE) 200 MG tablet Take 1 tablet (200 mg total) by mouth every morning.  Marland Kitchen apixaban (ELIQUIS) 5 MG TABS tablet Take 1 tablet (5 mg total) by mouth 2 (two) times daily.  Marland Kitchen dicyclomine (BENTYL) 10 MG capsule TAKE 1 TO 2 CAPSULES BY MOUTH BEFORE MEALS AS NEEDED abdominal cramping AND loose stools  . DULoxetine (CYMBALTA) 30 MG capsule Take 30 mg by mouth 2 (two) times daily.  . famotidine (PEPCID) 20  MG tablet Take 20 mg by mouth at bedtime.   . furosemide (LASIX) 40 MG tablet TAKE 40 MG DAILY.  Marland Kitchen gabapentin (NEURONTIN) 300 MG capsule Take 300 mg by mouth 3 (three) times daily.  Marland Kitchen glipiZIDE (GLUCOTROL) 10 MG tablet Take 10 mg by mouth 2 (two) times daily before a meal.   . Insulin Glargine (LANTUS SOLOSTAR) 100 UNIT/ML Solostar Pen Inject 20-50 Units into the skin at bedtime as needed. Patient takes 20 units every night at bedtime if over 178.  Takes 50 units if her blood sugar is above 300.  . levETIRAcetam (KEPPRA) 750 MG tablet Take 750 mg by mouth 2 (two) times daily.  Marland Kitchen levothyroxine (SYNTHROID, LEVOTHROID) 137 MCG tablet Take 137 mcg by mouth daily.  Marland Kitchen loperamide (IMODIUM) 2 MG capsule Take 1 capsule (2 mg total) by mouth 2 (two) times daily as needed for diarrhea or loose stools.  Marland Kitchen LORazepam (ATIVAN) 1 MG tablet Take 1 mg by mouth 3 (three) times  daily as needed for anxiety or sleep. For anxiety  . magnesium oxide (MAG-OX) 400 MG tablet Take 400 mg by mouth daily.   . metaxalone (SKELAXIN) 800 MG tablet Take 800 mg by mouth 3 (three) times daily.   . metFORMIN (GLUCOPHAGE) 500 MG tablet Take 1,000 mg by mouth 2 (two) times daily with a meal.   . Multiple Vitamin (MULTIVITAMIN WITH MINERALS) TABS tablet Take 1 tablet by mouth daily.  . nitroGLYCERIN (NITROSTAT) 0.4 MG SL tablet Place 0.4 mg under the tongue every 5 (five) minutes as needed for chest pain. Reported on 07/08/2015  . ondansetron (ZOFRAN) 4 MG tablet TAKE ONE TABLET BY MOUTH FOUR TIMES DAILY FOR NAUSEA - BEFORE MEALS AND AT BEDTIME  . pantoprazole (PROTONIX) 40 MG tablet TAKE ONE TABLET BY MOUTH TWICE DAILY BEFORE APPLY MEAL  . potassium chloride (K-DUR,KLOR-CON) 10 MEQ tablet TAKE TWO TABLETS BY MOUTH TWICE DAILY - pt. needs APPOINTMENT. FOR further refills  . promethazine (PHENERGAN) 12.5 MG tablet Take 1 tablet (12.5 mg total) by mouth every 4 (four) hours as needed for nausea or vomiting.  . vitamin B-12 (CYANOCOBALAMIN)  500 MCG tablet Take 500 mcg by mouth daily.     Allergies:   Ibuprofen; Codeine; Dilaudid [hydromorphone hcl]; Celexa [citalopram hydrobromide]; and Reglan [metoclopramide]   Social History   Social History  . Marital status: Married    Spouse name: N/A  . Number of children: N/A  . Years of education: N/A   Occupational History  . 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 Smoking status: Former Smoker    Packs/day: 0.25    Years: 15.00    Types: Cigarettes    Start date: 02/26/1966    Quit date: 07/01/1983  . Smokeless tobacco: Never Used     Comment: quit in 1984  . Alcohol use No     Comment: last etoh one year ago, never frequent  . Drug use: No  . Sexual activity: Yes    Birth control/ protection: None, Surgical   Other Topics Concern  . None   Social History Narrative   Sedentary   4 children, "blended family"     Family History:  The patient's family history includes Alzheimer's disease in her mother; Breast cancer in her sister; Heart attack in her mother; Hypertension in her mother and other; Stroke in her mother.   ROS:   Please see the history of present illness.    Review of Systems  Constitution: Positive for weakness and malaise/fatigue.  HENT: Negative.   Eyes: Negative.   Cardiovascular: Positive for dyspnea on exertion and leg swelling.  Respiratory: Negative.   Hematologic/Lymphatic: Negative.   Musculoskeletal: Positive for arthritis, joint pain and muscle weakness.  Gastrointestinal: Negative.   Genitourinary: Negative.    All other systems reviewed and are negative.   PHYSICAL EXAM:   VS:  BP 116/76   Pulse 60   Ht  (1.676 m)   Wt 242 lb (109.8 kg)   SpO2 94%   BMI 39.06 kg/m   Physical Exam  GEN: Obese, in no acute distress  Neck: no JVD, carotid bruits, or masses Cardiac:irregular irregular; no murmurs, rubs, or gallops  Respiratory: decreased breath sounds but clear GI: soft,  nontender, nondistended, + BS Ext: +1 edema on the left +2 on the right Neuro:  Alert and Oriented x 3 Psych: euthymic mood, full affect  Wt Readings from  Last 3 Encounters:  04/05/17 242 lb (109.8 kg)  03/24/17 251 lb (113.9 kg)  02/13/17 243 lb (110.2 kg)      Studies/Labs Reviewed:   EKG:  EKG is not ordered today.    Recent Labs: 08/19/2016: B Natriuretic Peptide 176.0 02/13/2017: ALT 13; BUN 16; Creatinine, Ser 1.17; Hemoglobin 12.2; Platelets 192; Potassium 4.4; Sodium 137 03/27/2017: Magnesium 1.7   Lipid Panel    Component Value Date/Time   CHOL 77 09/02/2011 0446   TRIG 88 09/02/2011 0446   HDL 35 (L) 09/02/2011 0446   CHOLHDL 2.2 09/02/2011 0446   VLDL 18 09/02/2011 0446   LDLCALC 24 09/02/2011 0446    Additional studies/ records that were reviewed today include:  Echocardiogram 12/26/2015 Left ventricle: The cavity size was normal. Systolic function was   normal. Wall motion was normal; there were no regional wall   motion abnormalities. Doppler parameters are consistent with   abnormal left ventricular relaxation (grade 1 diastolic   dysfunction). Doppler parameters are consistent with elevated   ventricular end-diastolic filling pressure. - Aortic root: The aortic root was normal in size. - Mitral valve: Calcified annulus. Mildly thickened leaflets .   There was mild regurgitation. - Left atrium: The atrium was moderately dilated. - Right ventricle: The cavity size was normal. Wall thickness was   normal. Systolic function was normal. - Right atrium: The atrium was normal in size. - Tricuspid valve: There was no regurgitation. - Pulmonary arteries: Systolic pressure was mildly increased. PA   peak pressure: 44 mm Hg (S). - Inferior vena cava: The vessel was normal in size. - Pericardium, extracardiac: There was no pericardial effusion.   12/2013 Echo Study Conclusions  - Limited study to evaluate LV systolic function. - Left ventricle: The cavity size  was normal. Wall thickness was   normal. Systolic function was normal. The estimated ejection   fraction was in the range of 50% to 55%. Wall motion was normal;   there were no regional wall motion abnormalities. The study was   not technically sufficient to allow evaluation of LV diastolic   dysfunction due to atrial fibrillation. - Aortic valve: Valve area (VTI): 2.49 cm^2. Valve area (Vmax):   2.11 cm^2. - Mitral valve: There was moderate regurgitation. The MR vena   contracta is 0.4 cm. - Left atrium: The atrium was severely dilated. - Right ventricle: The cavity size was mildly to moderately   dilated. - Right atrium: The atrium was severely dilated. - Atrial septum: From available images no evidence of ASD or PFO by   2D or color Doppler images. - Tricuspid valve: There was moderate regurgitation. - Pulmonary arteries: Systolic pressure was severely increased. PA   peak pressure: 85 mm Hg (S).   01/2014 [facility] HEMODYNAMICS:  Aortic pressure 123/73 mmHg; LV pressure 122/6 mmHg; LVEDP 15 mm mercury; RA 4 mmHg; RV 31/5; PA 38/13 mmHg; PCWP(mean) unable to wedge the balloon tip catheter; Cardiac Output by Fick 4.73 L per minute   ANGIOGRAPHIC DATA:   The left main coronary artery is normal.   The left anterior descending artery is widely patent, traverses the LV apex, gives origin to 2 diagonals, and has no significant obstruction..   The left circumflex artery is widely patent and gives a single large obtuse marginal branch that trifurcates on the lateral wall. No significant obstruction.   The right coronary artery is dominant, gives 3 left ventricular branches and a large bifurcating PDA. No obstructions noted.Marland Kitchen   LEFT VENTRICULOGRAM:  Left ventricular angiogram was done in the 30 RAO projection and revealed normal LV cavity size, tachycardia due to atrial fibrillation, and mild global reduction in LV function with an estimated ejection fraction of 40%     IMPRESSIONS:  1. Widely  patent coronary arteries 2. Mild pulmonary hypertension 3. Mildly depressed LV systolic function with an estimated EF of 40% 3. This did not confirm significant pulmonary hypertension. The procedure did suggest that the right atrium and right ventricle were enlarged as we had difficulty manipulating the right heart catheter into the pulmonary artery.     RECOMMENDATION:  Back to the outpatient area at home later today if no complications. She should not resume eloquence until 24 hours post procedure..   10/2014 Echo Study Conclusions  - Left ventricle: The cavity size was normal. Wall thickness was   increased in a pattern of mild LVH. There was mild concentric   hypertrophy. The estimated ejection fraction was 40%. Diffuse   hypokinesis. - Mitral valve: Calcified annulus. There was mild regurgitation. - Left atrium: The atrium was severely dilated. - Right atrium: The atrium was severely dilated. - Atrial septum: Thinned and stretched but no obvious PFO. No   defect or patent foramen ovale was identified. - Tricuspid valve: There was moderate regurgitation. - Impressions: E/E&' not reported and in afib diastolic evaluation   not possible.  Impressions:  - E/E&' not reported and in afib diastolic evaluation not possible.     01/2015 [facility]      1. Severely elevated right and left heart filling pressures.   2. Primarily pulmonary venous hypertension (may be additional component of PAH with PVR 4.6 WU).    Plan:  I am going to admit her for IV diuresis.         Right Heart Pressures [facility] Procedural Findings: Hemodynamics (mmHg) RA mean 23 RV 68/14 PA 70/36, mean 52 PCWP mean 31  Oxygen saturations: PA 60% AO 98%  Cardiac Output (Fick) 4.54  Cardiac Index (Fick) 2.32  PVR 4.6 WU               ASSESSMENT:    1. Acute on chronic combined systolic and diastolic CHF (congestive heart failure) (HCC)   2. Permanent atrial fibrillation (HCC)   3. Essential hypertension       PLAN:  In order of problems listed above:  Acute on Chronic combined systolic and diastolic CHF weight 251 pounds 03/24/17 and Lasix increased to 40 mg daily.weight today 242 pounds. Still has some edema and dyspnea on exertion. We'll check bmet today. Continue Lasix 40 mg once daily and potassium twice daily.Readjust if needed after blood work.  Pulmonary hypertension Mild pulmonary hypertension on right heart cath 2015   atrial fibrillation has been in normal sinus rhythm on amiodarone and Eliquis. No EKG done today.  Essential hypertension blood pressure controlled today      Medication Adjustments/Labs and Tests Ordered: Current medicines are reviewed at length with the patient today.  Concerns regarding medicines are outlined above.  Medication changes, Labs and Tests ordered today are listed in the Patient Instructions below. There are no Patient Instructions on file for this visit.   Signed, Jacolyn Reedy, PA-C  04/05/2017 2:23 PM    [facility] [address] [place], [place], [place]  [address] Phone: [phone]; Fax: [phone]

## 2017-04-05 NOTE — Patient Instructions (Signed)
Medication Instructions:  Your physician recommends that you continue on your current medications as directed. Please refer to the Current Medication list given to you today.   Labwork: Your physician recommends that you return for lab work Today    Testing/Procedures: NONE   Follow-Up: Your physician recommends that you schedule a follow-up appointment in: 1-2 Months    Any Other Special Instructions Will Be Listed Below (If Applicable).     If you need a refill on your cardiac medications before your next appointment, please call your pharmacy.  Thank you for choosing [facility]!

## 2017-04-18 IMAGING — US US CAROTID DUPLEX BILAT
1 series · 13 of 24 positions shown · non-contrast
Comparison: 11/08/2012

CLINICAL DATA: Syncope, hypertension, coronary artery disease,
diabetes, previous tobacco abuse.

EXAM:
BILATERAL CAROTID DUPLEX ULTRASOUND
TECHNIQUE: Gray scale imaging, color Doppler and duplex ultrasound was
performed of bilateral carotid and vertebral arteries in the neck.
TECHNIQUE: Quantification of carotid stenosis is based on velocity parameters
that correlate the residual internal carotid diameter with
NASCET-based stenosis levels, using the diameter of the distal
internal carotid lumen as the denominator for stenosis measurement.

[Series 1: us carotid duplex bilat · 0.06mm/px · 13 of 69 slices shown]
[im 1/69]
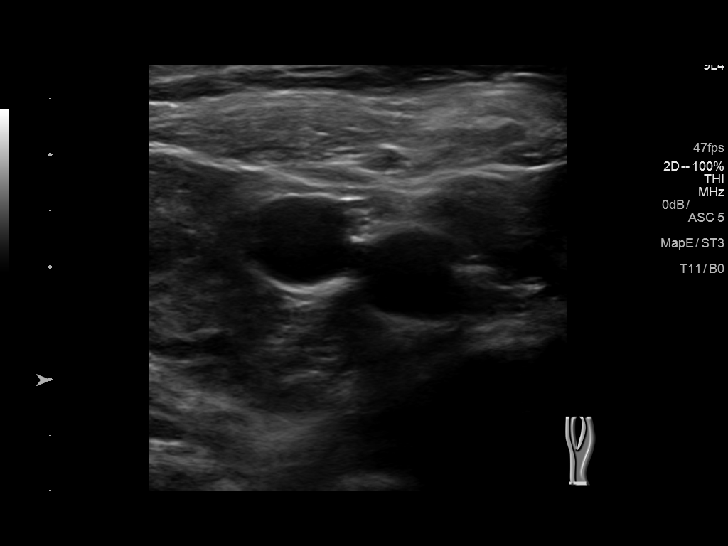
[im 6/69]
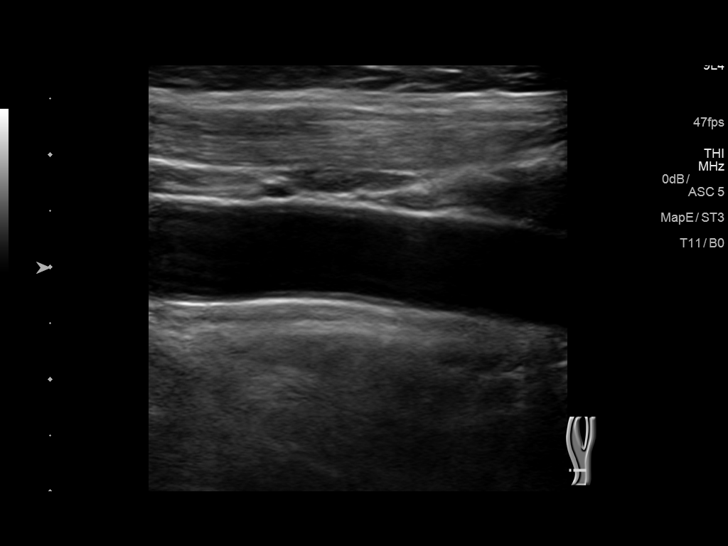
[im 12/69]
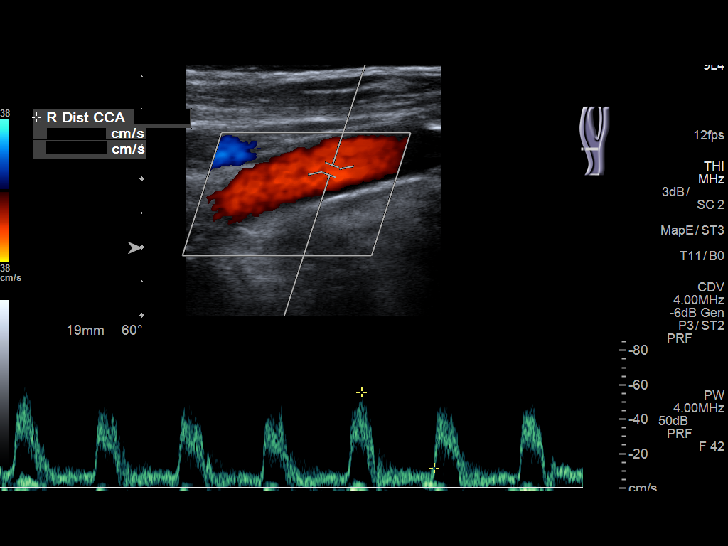
[im 18/69]
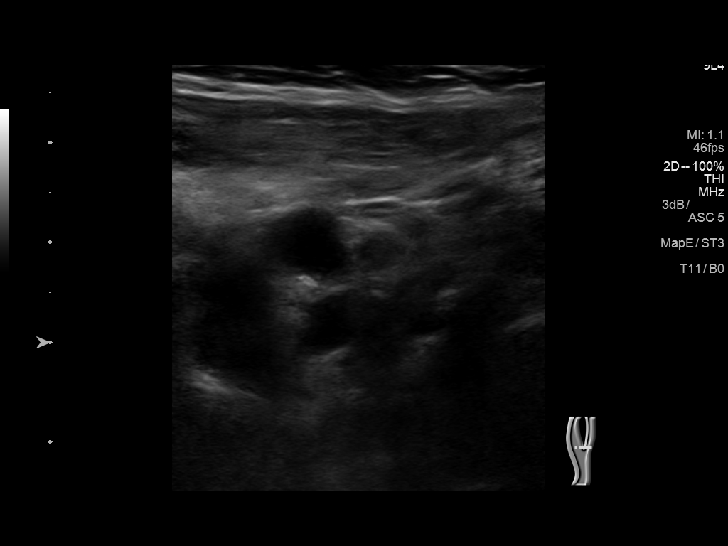
[im 24/69]
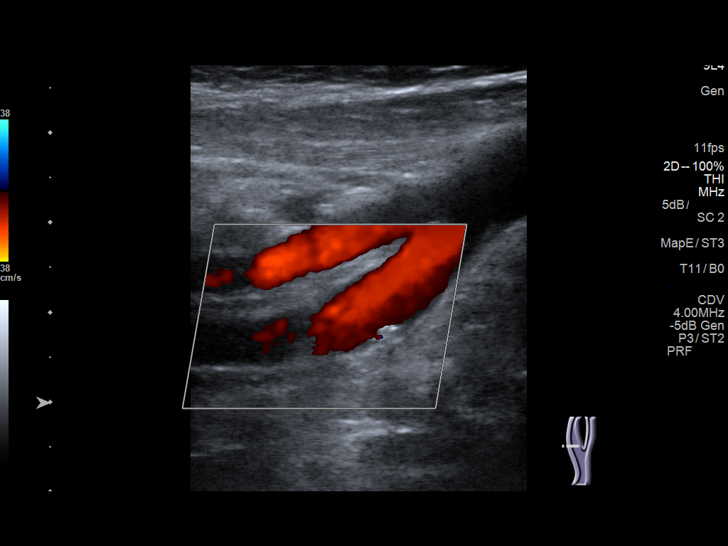
[im 30/69]
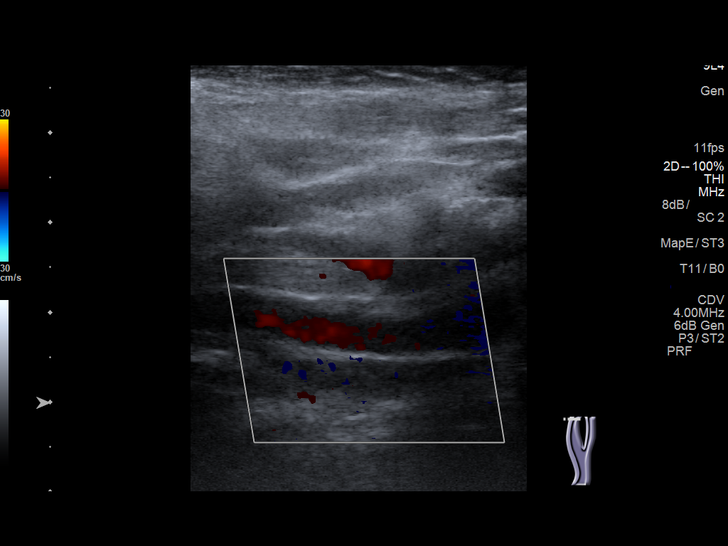
[im 36/69]
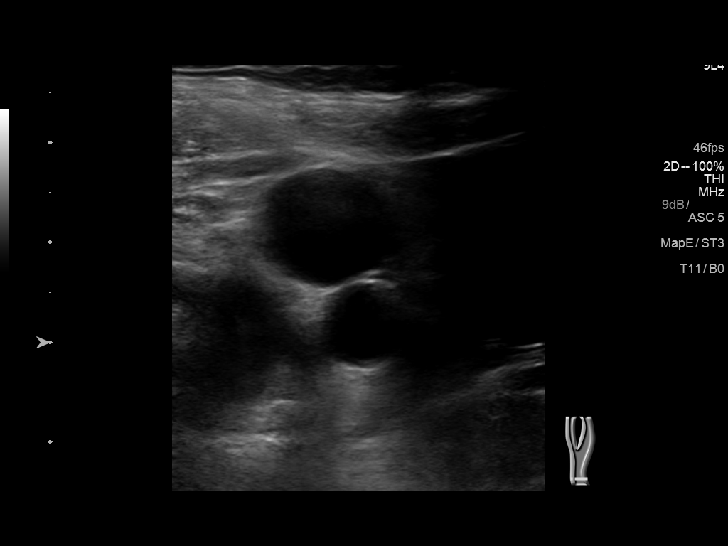
[im 39/69]
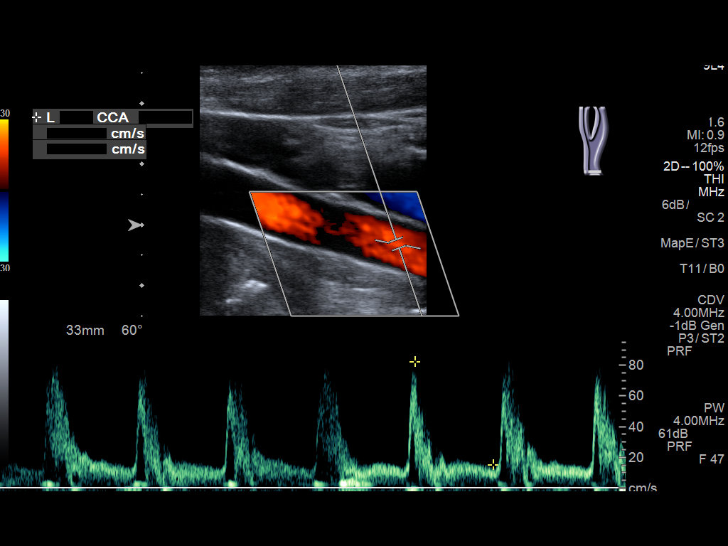
[im 45/69]
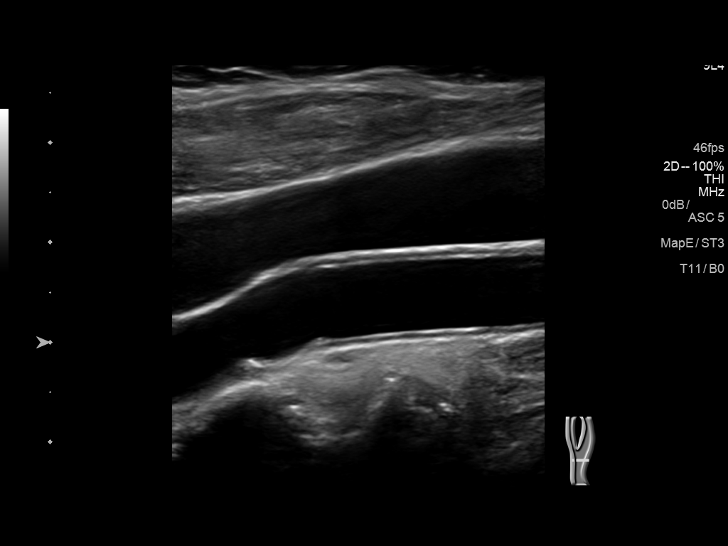
[im 51/69]
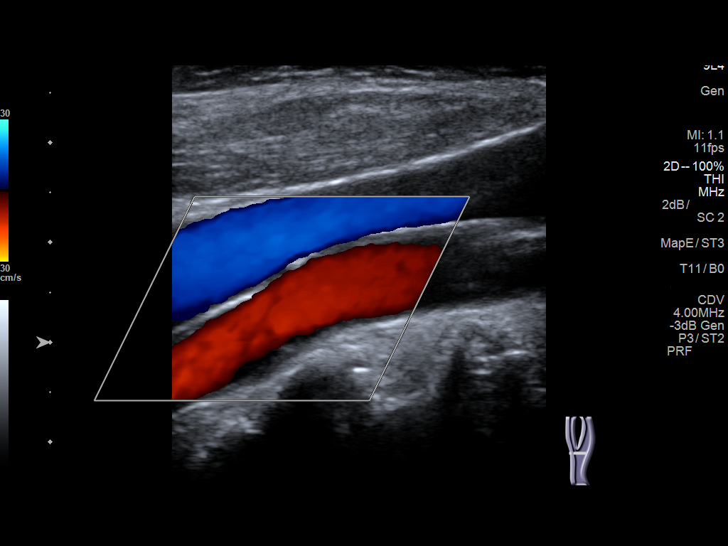
[im 57/69]
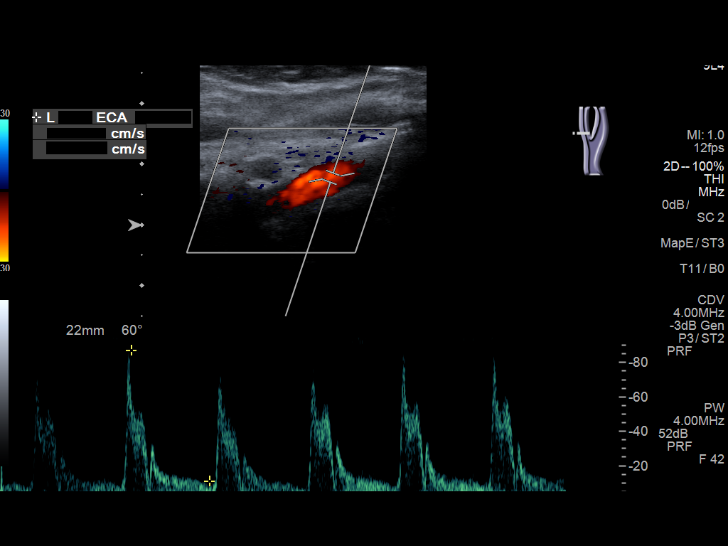
[im 63/69]
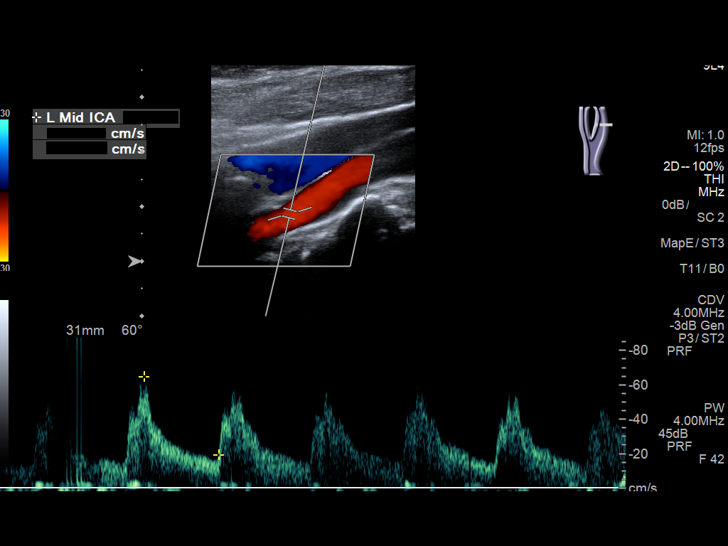
[im 69/69]
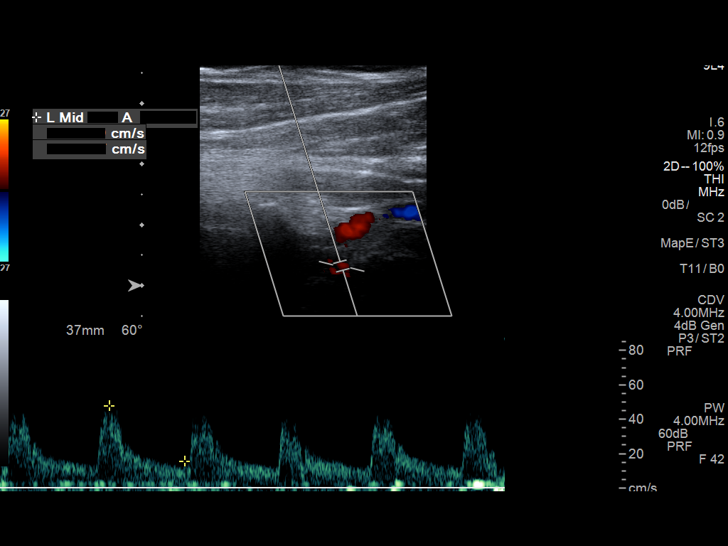

[13 of 24 positions shown; findings below may reference images not displayed]

The following velocity measurements were obtained:

PEAK SYSTOLIC/END DIASTOLIC

RIGHT

ICA:                     84/21cm/sec

CCA:                     82/10cm/sec

SYSTOLIC ICA/CCA RATIO:

DIASTOLIC ICA/CCA RATIO:

ECA:                     68cm/sec

LEFT

ICA:                     89/27cm/sec

CCA:                     82/15cm/sec

SYSTOLIC ICA/CCA RATIO:

DIASTOLIC ICA/CCA RATIO:

ECA:                     87cm/sec
FINDINGS: RIGHT CAROTID ARTERY: Mild partially calcified plaque in the bulb
and proximal ICA. No high-grade stenosis. Normal waveforms and color
Doppler signal.

RIGHT VERTEBRAL ARTERY:  Normal flow direction and waveform.

LEFT CAROTID ARTERY: Eccentric partially calcified plaque in the
bulb and ICA origin. No high-grade stenosis. Normal waveforms and
color Doppler signal.

LEFT VERTEBRAL ARTERY: Normal flow direction and waveform.
IMPRESSION: 1. Mild bilateral carotid bifurcation and proximal ICA plaque,
resulting in less than 50% diameter stenosis.
2.  Antegrade bilateral vertebral arterial flow.

## 2017-04-19 ENCOUNTER — Telehealth: Payer: Self-pay | Admitting: Adult Health

## 2017-04-19 NOTE — Telephone Encounter (Signed)
Patient's daughter states that [facility] said they had no record of Furosemide increase to BID. Please clarify. / tg

## 2017-04-19 NOTE — Telephone Encounter (Signed)
Spoke with pt's daughter. Pt's lasix should be 40 mg daily, not BID. She stated that maybe she misunderstood instructions. She will let mother know.

## 2017-04-20 ENCOUNTER — Other Ambulatory Visit: Payer: Self-pay | Admitting: *Deleted

## 2017-04-20 MED ORDER — POTASSIUM CHLORIDE CRYS ER 10 MEQ PO TBCR: EXTENDED_RELEASE_TABLET | ORAL | 11 refills | Status: DC

## 2017-04-26 ENCOUNTER — Ambulatory Visit: Payer: Medicare Other | Admitting: Gastroenterology

## 2017-05-06 ENCOUNTER — Other Ambulatory Visit: Payer: Self-pay | Admitting: Gastroenterology

## 2017-05-10 ENCOUNTER — Other Ambulatory Visit: Payer: Self-pay | Admitting: Gastroenterology

## 2017-06-06 ENCOUNTER — Other Ambulatory Visit: Payer: Self-pay | Admitting: Gastroenterology

## 2017-06-07 ENCOUNTER — Encounter: Payer: Self-pay | Admitting: Cardiology

## 2017-06-07 ENCOUNTER — Other Ambulatory Visit: Payer: Self-pay

## 2017-06-07 ENCOUNTER — Ambulatory Visit (INDEPENDENT_AMBULATORY_CARE_PROVIDER_SITE_OTHER): Payer: Medicare Other | Admitting: Cardiology

## 2017-06-07 VITALS — BP 130/70 | HR 70 | Ht 66.0 in | Wt 239.0 lb

## 2017-06-07 DIAGNOSIS — I4891 Unspecified atrial fibrillation: Secondary | ICD-10-CM | POA: Diagnosis not present

## 2017-06-07 DIAGNOSIS — I5032 Chronic diastolic (congestive) heart failure: Secondary | ICD-10-CM | POA: Diagnosis not present

## 2017-06-07 NOTE — Progress Notes (Signed)
Clinical Summary [name] is a 66 y.o.femaleseen today for follow up of the following medical problems.   1. Chronicsystolic/ diastolic CHF with mixed precap and post postcap Pulmonary Hypertension - echo 12/2013 PASP 85, moderate TR, RV mild to moderately dilated with normal function, could not eval diasotlic function due to afib, LVEF 50-55%. Biatrial enlargement.  - RHC 01/2015 PA 38/13 and calculated mean of 21, could not get a wedge however LVEDP 15. - repeat echo 10/2014 PASP 61 mmHg (down from 85 on prior echo), some evidence of RV dysfunction with RV TAPSE 1.4 and tissue anular systolic velocity of 7.5. Cannot evaluate diastolic function, however severe biatrial enlarement suggests significant dysfunction, - 01/2015 RHC mean PA 52, PCWP 31 - 01/2015 echo LVEF 55-60%, PASP 46  - after most recent RHC in 01/2015 she was admitted for diuresis. Discharge weight 170 lbs, reportedly down from 190 on admission. Marland Kitchen. Discharged on lasix 60mg  bid.     - recent issues with weight gain and edema. 03/2017 weight up to 251. Lasix was increased to 40mg  daily. Down to 242 lbs last visit and to 239 lbs today. Edema is improving. Left tends to swell more due to prior knee surgery  - since last visit home weights running around 236-242 - compliant with meds.  - can have some occasional SOB/DOE which is stable.   2. Afib   - no recent palptiations. No recent bleeding on eliquis   3. Ascites - followed by GI, recent US 10/2014 with scattered mild ascites without large pocket. 08/2014 CT scan  4. Chronic diarrhea - followed by GI.  5. OSA - could not tolerate CPAP    Past Medical History:  Diagnosis Date  . Anemia    H/H of 10/30 with a normal MCV in 12/09  . Anxiety   . Arthritis   . Barrett's esophagus    Diagnosed 1995. Last EGD 2016-NO BARRETT'S.   . Chest pain    Negative cardiac catheterization in 2002; negative stress nuclear study in 2008  . Chronic anticoagulation   .  Chronic diastolic CHF (congestive heart failure) (HCC)    a. EF predominantly normal during prior echoes but was 40% during 10/2014 echo. b. Most recent 01/2015 EF normal, 55-60%.  . Chronic LBP    Surgical intervention in 1996  . Diabetes mellitus, type 2 (HCC)    Insulin therapy; exacerbated by prednisone  . Dysrhythmia    AFib  . Gastroparesis    99% retention 05/2008 on GES  . GERD (gastroesophageal reflux disease)   . Hiatal hernia   . Hyperlipidemia   . Hypertension   . Hypothyroid   . IBS (irritable bowel syndrome)   . Obesity   . OSA on CPAP    had CPAP and cannot tolerate.  . Paroxysmal atrial fibrillation (HCC)   . Pulmonary hypertension (HCC) 01/2015   a. Predominantly pulmonary venous hypertension but may be component of PAH.  . Seizures (HCC)    last seizure was 2 years ago; on keppra for this; unknown etiology  . Syncope    a. Admitted 05/2009; magnetic resonance imagin/ MRA - negative; etiology thought to be orthostasis secondary to drugs and dehydration. b. Syncope 02/2015 also felt 2/2 dehydration.     Allergies  Allergen Reactions  . Ibuprofen Other (See Comments)    Upsets her stomach and causes abdominal pain  . Codeine Nausea And Vomiting and Other (See Comments)    HALLUCINATIONS  . Dilaudid [Hydromorphone  Hcl] Other (See Comments)    Made her pass out  . Celexa [Citalopram Hydrobromide] Other (See Comments)    Dyskinesia  . Reglan [Metoclopramide] Other (See Comments)    DYSKINESIA     Current Outpatient Medications  Medication Sig Dispense Refill  . acetaminophen (TYLENOL) 500 MG tablet Take 500 mg by mouth every 6 (six) hours as needed for moderate pain or headache.    . albuterol (PROVENTIL HFA;VENTOLIN HFA) 108 (90 Base) MCG/ACT inhaler Inhale 2 puffs into the lungs every 4 (four) hours as needed for wheezing or shortness of breath. 1 Inhaler 0  . amiodarone (PACERONE) 200 MG tablet Take 1 tablet (200 mg total) by mouth every morning. 90 tablet 3   . apixaban (ELIQUIS) 5 MG TABS tablet Take 1 tablet (5 mg total) by mouth 2 (two) times daily. 60 tablet 0  . dicyclomine (BENTYL) 10 MG capsule TAKE 1 TO 2 CAPSULES BY MOUTH BEFORE meals AS NEEDED 180 capsule 1  . DULoxetine (CYMBALTA) 30 MG capsule Take 30 mg by mouth 2 (two) times daily.    . famotidine (PEPCID) 20 MG tablet Take 20 mg by mouth at bedtime.     . furosemide (LASIX) 40 MG tablet TAKE 40 MG DAILY. 90 tablet 3  . gabapentin (NEURONTIN) 300 MG capsule Take 300 mg by mouth 3 (three) times daily.    Marland Kitchen glipiZIDE (GLUCOTROL) 10 MG tablet Take 10 mg by mouth 2 (two) times daily before a meal.     . Insulin Glargine (LANTUS SOLOSTAR) 100 UNIT/ML Solostar Pen Inject 20-50 Units into the skin at bedtime as needed. Patient takes 20 units every night at bedtime if over 178.  Takes 50 units if her blood sugar is above 300.    . levETIRAcetam (KEPPRA) 750 MG tablet Take 750 mg by mouth 2 (two) times daily.    Marland Kitchen levothyroxine (SYNTHROID, LEVOTHROID) 137 MCG tablet Take 137 mcg by mouth daily.    Marland Kitchen loperamide (IMODIUM) 2 MG capsule TAKE ONE CAPSULE BY MOUTH TWICE DAILY AS NEEDED 60 capsule 3  . LORazepam (ATIVAN) 1 MG tablet Take 1 mg by mouth 3 (three) times daily as needed for anxiety or sleep. For anxiety    . magnesium oxide (MAG-OX) 400 MG tablet Take 400 mg by mouth daily.     . metaxalone (SKELAXIN) 800 MG tablet Take 800 mg by mouth 3 (three) times daily.     . metFORMIN (GLUCOPHAGE) 500 MG tablet Take 1,000 mg by mouth 2 (two) times daily with a meal.     . Multiple Vitamin (MULTIVITAMIN WITH MINERALS) TABS tablet Take 1 tablet by mouth daily.    . nitroGLYCERIN (NITROSTAT) 0.4 MG SL tablet Place 0.4 mg under the tongue every 5 (five) minutes as needed for chest pain. Reported on 07/08/2015    . ondansetron (ZOFRAN) 4 MG tablet TAKE ONE TABLET BY MOUTH FOUR TIMES DAILY FOR NAUSEA - BEFORE MEALS AND AT BEDTIME 120 tablet 3  . pantoprazole (PROTONIX) 40 MG tablet TAKE ONE TABLET BY MOUTH  TWICE DAILY BEFORE APPLY MEAL 180 tablet 3  . potassium chloride (K-DUR,KLOR-CON) 10 MEQ tablet TAKE TWO TABLETS BY MOUTH TWICE DAILY 120 tablet 11  . promethazine (PHENERGAN) 12.5 MG tablet Take 1 tablet (12.5 mg total) by mouth every 4 (four) hours as needed for nausea or vomiting. 42 tablet 0  . vitamin B-12 (CYANOCOBALAMIN) 500 MCG tablet Take 500 mcg by mouth daily.     No current facility-administered medications for this  visit.      Past Surgical History:  Procedure Laterality Date  . BACK SURGERY  1996  . BIOPSY N/A 11/08/2013   Procedure: BIOPSY  / Tissue sampling / ulcers present in small intestine;  Surgeon: West Bali, MD;  Location: [facility];  Service: Endoscopy;  Laterality: N/A;  . CARDIAC CATHETERIZATION  2002  . CARDIAC CATHETERIZATION N/A 01/26/2015   Procedure: Right Heart Cath;  Surgeon: Laurey Morale, MD;  Location: [facility] INVASIVE CV LAB;  Service: Cardiovascular;  Laterality: N/A;  . CARDIOVASCULAR STRESS TEST  2008   Stress nuclear study  . CARDIOVERSION N/A 03/06/2015   Procedure: CARDIOVERSION;  Surgeon: Laurey Morale, MD;  Location: [facility] ENDOSCOPY;  Service: Cardiovascular;  Laterality: N/A;  . CARPAL TUNNEL RELEASE  1994  . COLONOSCOPY  11/2011   Dr. Darrick Penna: Internal hemorrhoids, mild diverticulosis. Random colon biopsies negative.  . COLONOSCOPY N/A 11/08/2013   SLF: Normal mucosa in the terminal ileum/The colon IS redundant/  Moderate diverticulosis throughout the entire colon. ileum bx benign. colon bx benign  . ESOPHAGOGASTRODUODENOSCOPY  2008   Barrett's without dysplasia. esphagus dilated. antral erosions, h.pylori serologies negative.  . ESOPHAGOGASTRODUODENOSCOPY  11/2011   Dr. Darrick Penna: Barrett's esophagus, mild gastritis, diverticulum in the second portion of the duodenum repeat EGD 3 years. Small bowel biopsies negative. Gastric biopsy show reactive gastropathy but no H. pylori. Esophageal biopsies consistent with GERD. Next EGD 11/2014  .  ESOPHAGOGASTRODUODENOSCOPY N/A 11/21/2014   UJW:[id number] non-erosive gastritis/irregular z-line  . GIVENS CAPSULE STUDY  12/07/2011   Proximal small bowel, rare AVM. Distal small bowel, multiple ulcers noted  . GIVENS CAPSULE STUDY N/A 09/27/2013   Distal small bowel ulcers extending to TI.  Marland Kitchen GIVENS CAPSULE STUDY N/A 10/10/2013   Procedure: GIVENS CAPSULE STUDY;  Surgeon: West Bali, MD;  Location: [facility];  Service: Endoscopy;  Laterality: N/A;  7:30  . KNEE ARTHROSCOPY WITH MEDIAL MENISECTOMY Right 06/09/2016   Procedure: KNEE ARTHROSCOPY WITH MEDIAL MENISECTOMY;  Surgeon: Vickki Hearing, MD;  Location: [facility] ORS;  Service: Orthopedics;  Laterality: Right;  medial and lateral menisectomy  . LAMINECTOMY  1995   L4-L5  . LAPAROSCOPIC CHOLECYSTECTOMY  1990s  . LEFT HEART CATHETERIZATION WITH CORONARY ANGIOGRAM  01/10/2014   Procedure: LEFT HEART CATHETERIZATION WITH CORONARY ANGIOGRAM;  Surgeon: Lesleigh Noe, MD;  Location: [facility] CATH LAB;  Service: Cardiovascular;;  . RIGHT HEART CATHETERIZATION N/A 01/10/2014   Procedure: RIGHT HEART CATH;  Surgeon: Lesleigh Noe, MD;  Location: [facility] CATH LAB;  Service: Cardiovascular;  Laterality: N/A;  . TOTAL ABDOMINAL HYSTERECTOMY  1999     Allergies  Allergen Reactions  . Ibuprofen Other (See Comments)    Upsets her stomach and causes abdominal pain  . Codeine Nausea And Vomiting and Other (See Comments)    HALLUCINATIONS  . Dilaudid [Hydromorphone Hcl] Other (See Comments)    Made her pass out  . Celexa [Citalopram Hydrobromide] Other (See Comments)    Dyskinesia  . Reglan [Metoclopramide] Other (See Comments)    DYSKINESIA      Family History  Problem Relation Age of Onset  . Hypertension Mother   . Alzheimer's disease Mother   . Stroke Mother   . Heart attack Mother   . Hypertension Other   . Breast cancer Sister   . Heart disease Neg Hx   . Colon cancer Neg Hx      Social History Ms. Thammavong reports that she quit smoking  about  33 years ago. Her smoking use included cigarettes. She started smoking about 51 years ago. She has a 3.75 pack-year smoking history. she has never used smokeless tobacco. Ms. Leske reports that she does not drink alcohol.   Review of Systems CONSTITUTIONAL: No weight loss, fever, chills, weakness or fatigue.  HEENT: Eyes: No visual loss, blurred vision, double vision or yellow sclerae.No hearing loss, sneezing, congestion, runny nose or sore throat.  SKIN: No rash or itching.  CARDIOVASCULAR: per hpi RESPIRATORY: No shortness of breath, cough or sputum.  GASTROINTESTINAL: intermittent diarrhea GENITOURINARY: No burning on urination, no polyuria NEUROLOGICAL: No headache, dizziness, syncope, paralysis, ataxia, numbness or tingling in the extremities. No change in bowel or bladder control.  MUSCULOSKELETAL: No muscle, back pain, joint pain or stiffness.  LYMPHATICS: No enlarged nodes. No history of splenectomy.  PSYCHIATRIC: No history of depression or anxiety.  ENDOCRINOLOGIC: No reports of sweating, cold or heat intolerance. No polyuria or polydipsia.  Marland Kitchen   Physical Examination Vitals:   06/07/17 1410  BP: 130/70  Pulse: 70  SpO2: 93%   Vitals:   06/07/17 1410  Weight: 239 lb (108.4 kg)  Height: 5\' 6"  (1.676 m)    Gen: resting comfortably, no acute distress HEENT: no scleral icterus, pupils equal round and reactive, no palptable cervical adenopathy,  CV: RRR, no m/r/g, no jvd Resp: Clear to auscultation bilaterally GI: abdomen is soft, non-tender, non-distended, normal bowel sounds, no hepatosplenomegaly MSK: extremities are warm, no edema.  Skin: warm, no rash Neuro:  no focal deficits Psych: appropriate affect   Diagnostic Studies 12/2013 Echo Study Conclusions  - Limited study to evaluate LV systolic function. - Left ventricle: The cavity size was normal. Wall thickness was normal. Systolic function was normal. The estimated ejection fraction was in the  range of 50% to 55%. Wall motion was normal; there were no regional wall motion abnormalities. The study was not technically sufficient to allow evaluation of LV diastolic dysfunction due to atrial fibrillation. - Aortic valve: Valve area (VTI): 2.49 cm^2. Valve area (Vmax): 2.11 cm^2. - Mitral valve: There was moderate regurgitation. The MR vena contracta is 0.4 cm. - Left atrium: The atrium was severely dilated. - Right ventricle: The cavity size was mildly to moderately dilated. - Right atrium: The atrium was severely dilated. - Atrial septum: From available images no evidence of ASD or PFO by 2D or color Doppler images. - Tricuspid valve: There was moderate regurgitation. - Pulmonary arteries: Systolic pressure was severely increased. PA peak pressure: 85 mm Hg (S).  01/2014 RHC HEMODYNAMICS: Aortic pressure 123/73 mmHg; LV pressure 122/6 mmHg; LVEDP 15 mm mercury; RA 4 mmHg; RV 31/5; PA 38/13 mmHg; PCWP(mean) unable to wedge the balloon tip catheter; Cardiac Output by Fick 4.73 L per minute  ANGIOGRAPHIC DATA: The left main coronary artery is normal.  The left anterior descending artery is widely patent, traverses the LV apex, gives origin to 2 diagonals, and has no significant obstruction..  The left circumflex artery is widely patent and gives a single large obtuse marginal Airyonna Franklyn that trifurcates on the lateral wall. No significant obstruction.  The right coronary artery is dominant, gives 3 left ventricular branches and a large bifurcating PDA. No obstructions noted.Marland Kitchen  LEFT VENTRICULOGRAM: Left ventricular angiogram was done in the 30 RAO projection and revealed normal LV cavity size, tachycardia due to atrial fibrillation, and mild global reduction in LV function with an estimated ejection fraction of 40%   IMPRESSIONS: 1. Widely patent coronary arteries 2. Mild  pulmonary hypertension 3. Mildly depressed LV systolic function with an estimated  EF of 40% 3. This did not confirm significant pulmonary hypertension. The procedure did suggest that the right atrium and right ventricle were enlarged as we had difficulty manipulating the right heart catheter into the pulmonary artery.   RECOMMENDATION: Back to the outpatient area at home later today if no complications. She should not resume eloquence until 24 hours post procedure..  10/2014 Echo Study Conclusions  - Left ventricle: The cavity size was normal. Wall thickness was increased in a pattern of mild LVH. There was mild concentric hypertrophy. The estimated ejection fraction was 40%. Diffuse hypokinesis. - Mitral valve: Calcified annulus. There was mild regurgitation. - Left atrium: The atrium was severely dilated. - Right atrium: The atrium was severely dilated. - Atrial septum: Thinned and stretched but no obvious PFO. No defect or patent foramen ovale was identified. - Tricuspid valve: There was moderate regurgitation. - Impressions: E/E&' not reported and in afib diastolic evaluation not possible.  Impressions:  - E/E&' not reported and in afib diastolic evaluation not possible.   01/2015 RHC    1. Severely elevated right and left heart filling pressures.  2. Primarily pulmonary venous hypertension (may be additional component of PAH with PVR 4.6 WU).   Plan:  I am going to admit her for IV diuresis.      Right Heart Pressures RHC Procedural Findings: Hemodynamics (mmHg) RA mean 23 RV 68/14 PA 70/36, mean 52 PCWP mean 31  Oxygen saturations: PA 60% AO 98%  Cardiac Output (Fick) 4.54  Cardiac Index (Fick) 2.32  PVR 4.6 WU        Assessment and Plan  1. Chronic diastolic HF with mixed pulmonary HTN -weights are stable, does not appear volume overloaded in clinic - continue current diuretics. REcheck BMET/Mg    2. Afib -  CHADS2Vasc score of 2, she will continue anticoagulation - no recent symptoms, continue current  meds    F/u 3 months     Antoine Poche, M.D

## 2017-06-07 NOTE — Patient Instructions (Addendum)
Your physician recommends that you schedule a follow-up appointment in: 3 MONTHS WITH DR [facility] IN [facility] OFFICE   Your physician recommends that you continue on your current medications as directed. Please refer to the Current Medication list given to you today.  Your physician recommends that you return for lab work BMP/MG  Thank you for choosing [facility]!!

## 2017-06-09 ENCOUNTER — Other Ambulatory Visit: Payer: Self-pay | Admitting: Cardiology

## 2017-06-09 LAB — BASIC METABOLIC PANEL WITH GFR
BUN / CREAT RATIO: 13 (calc) (ref 6–22)
BUN: 15 mg/dL (ref 7–25)
CALCIUM: 9.2 mg/dL (ref 8.6–10.4)
CHLORIDE: 99 mmol/L (ref 98–110)
CO2: 29 mmol/L (ref 20–32)
Creat: 1.12 mg/dL — ABNORMAL HIGH (ref 0.50–0.99)
GFR, EST AFRICAN AMERICAN: 58 mL/min/{1.73_m2} — AB (ref >=60)
GFR, EST NON AFRICAN AMERICAN: 50 mL/min/{1.73_m2} — AB (ref >=60)
Glucose, Bld: 173 mg/dL — ABNORMAL HIGH (ref 65–139)
POTASSIUM: 4.6 mmol/L (ref 3.5–5.3)
SODIUM: 136 mmol/L (ref 135–146)

## 2017-06-09 LAB — MAGNESIUM: Magnesium: 1.6 mg/dL (ref 1.5–2.5)

## 2017-06-11 ENCOUNTER — Encounter: Payer: Self-pay | Admitting: Cardiology

## 2017-06-15 ENCOUNTER — Telehealth: Payer: Self-pay | Admitting: *Deleted

## 2017-06-15 NOTE — Telephone Encounter (Signed)
Pt aware - routed to pcp  

## 2017-06-15 NOTE — Telephone Encounter (Signed)
-----   Message from Antoine Poche, MD sent at 06/14/2017 11:22 AM EST ----- Labs look ok  Rosine Door MD

## 2017-06-16 ENCOUNTER — Ambulatory Visit: Payer: Medicare Other | Admitting: Gastroenterology

## 2017-06-22 ENCOUNTER — Encounter: Payer: Self-pay | Admitting: Gastroenterology

## 2017-06-22 ENCOUNTER — Ambulatory Visit (INDEPENDENT_AMBULATORY_CARE_PROVIDER_SITE_OTHER): Payer: Medicare Other | Admitting: Gastroenterology

## 2017-06-22 VITALS — BP 113/64 | HR 60 | Temp 98.5°F | Ht 66.0 in | Wt 241.6 lb

## 2017-06-22 DIAGNOSIS — D508 Other iron deficiency anemias: Secondary | ICD-10-CM | POA: Diagnosis not present

## 2017-06-22 DIAGNOSIS — K529 Noninfective gastroenteritis and colitis, unspecified: Secondary | ICD-10-CM

## 2017-06-22 IMAGING — CR DG ANKLE PORT 2V*R*
1 series · 2 of 2 positions shown · non-contrast
Comparison: None.

CLINICAL DATA: Fall.Ankle pain.  Initial evaluation .

EXAM:
PORTABLE RIGHT ANKLE - 2 VIEW

[Series 2: ap · 0.17mm/px · 2 of 2 slices shown]
[im 1/2]
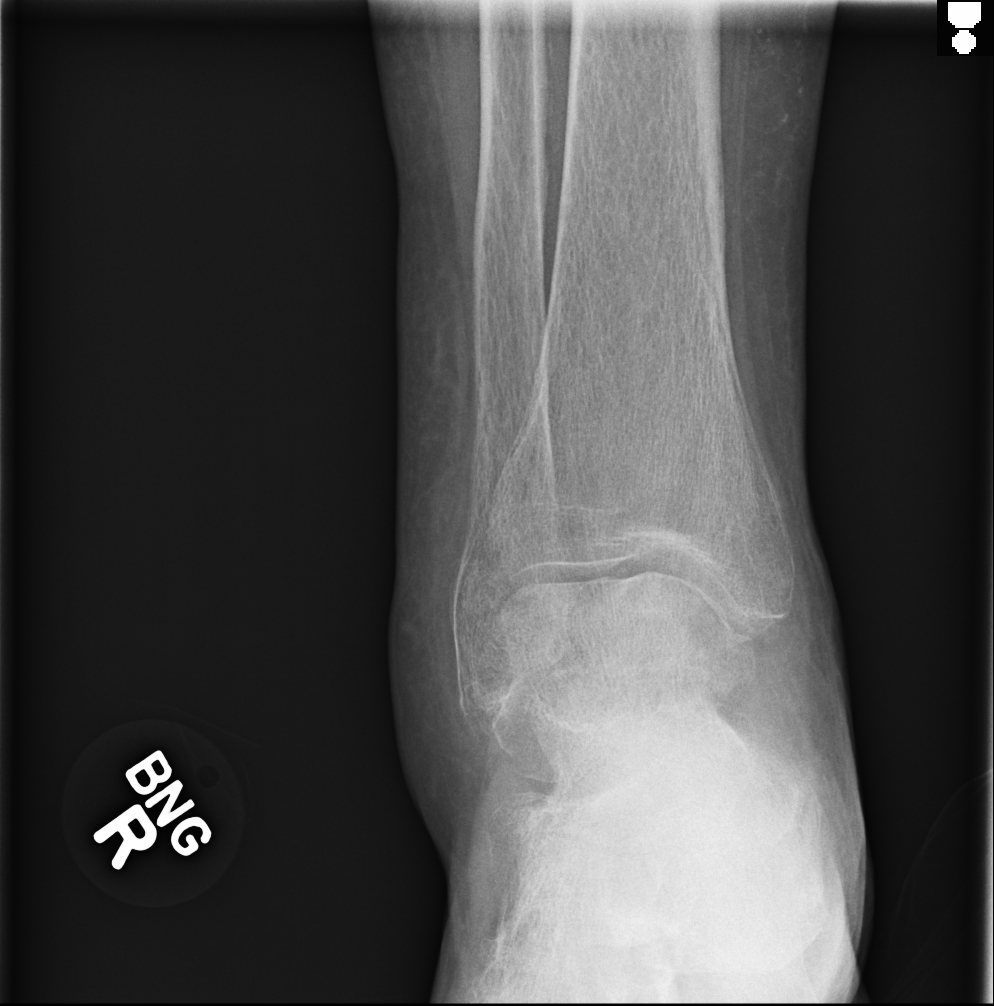
[im 2/2]
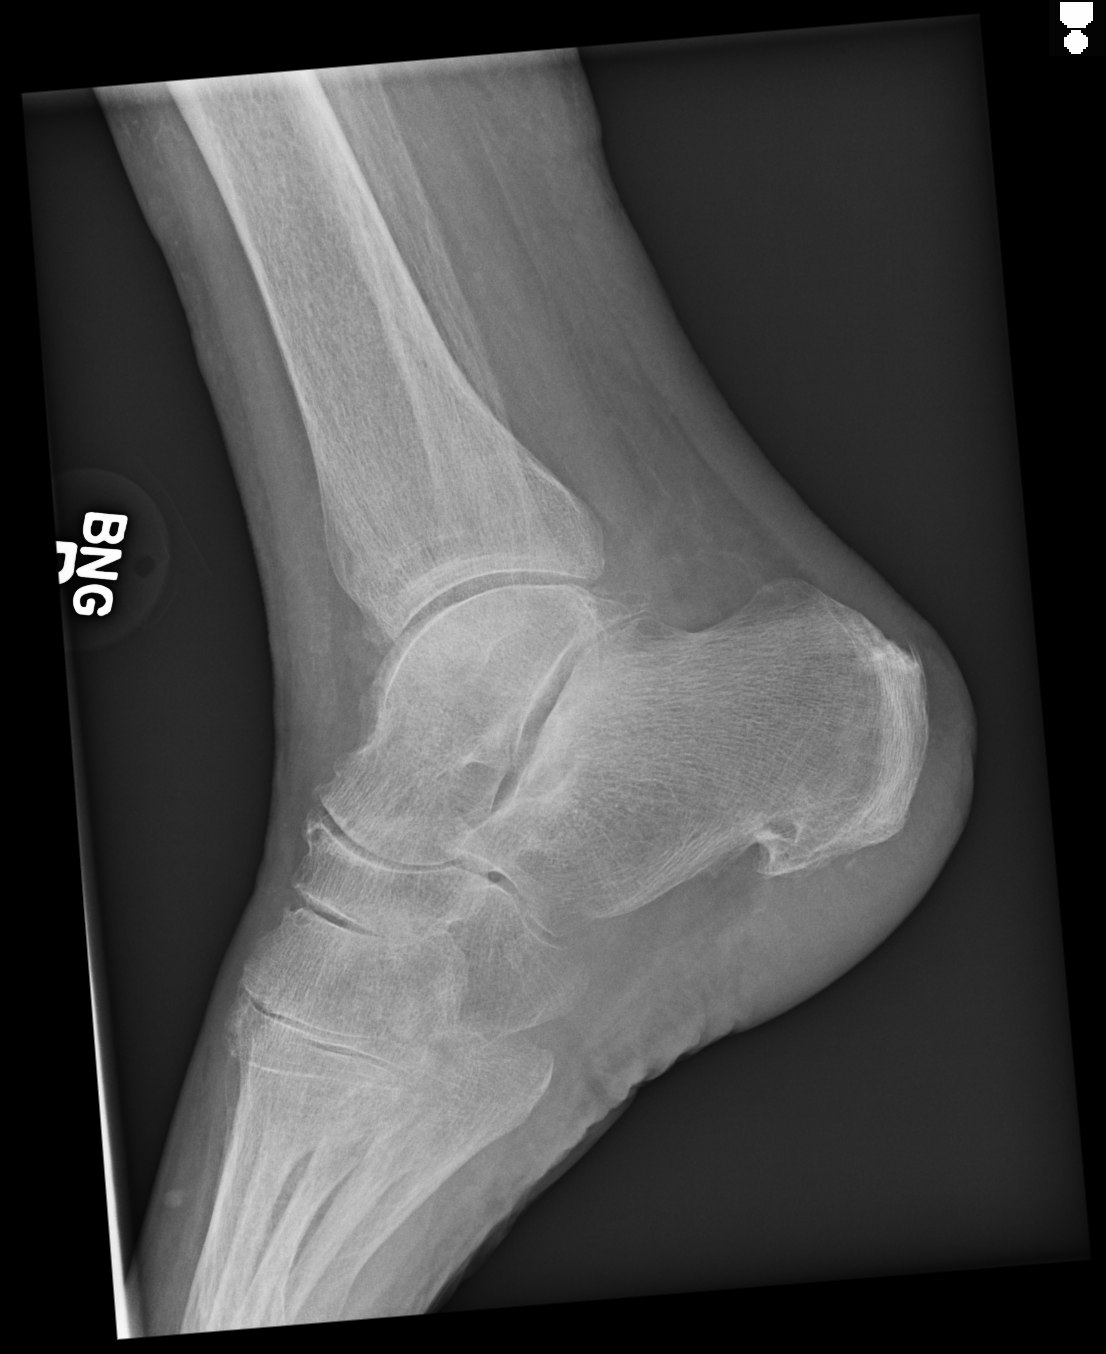

[2 of 2 positions shown; findings below may reference images not displayed]

FINDINGS: Soft tissue swelling over the lateral malleolus. Diffuse osteopenia
degenerative change. No evidence of displaced fracture. Peripheral
vascular calcification .
IMPRESSION: 1. Soft tissue swelling. Diffuse osteopenia and degenerative change.
No acute abnormality. Follow-up imaging in 7-10 days can be obtained
if symptoms persist.
2. Peripheral vascular disease.

## 2017-06-22 IMAGING — CR DG CHEST 1V PORT
1 series · 1 of 1 positions shown · non-contrast
Comparison: 07/20/2014

CLINICAL DATA: Dyspnea this morning. Chronic atrial fibrillation
and pulmonary hypertension

EXAM:
PORTABLE CHEST - 1 VIEW

[ap portable]
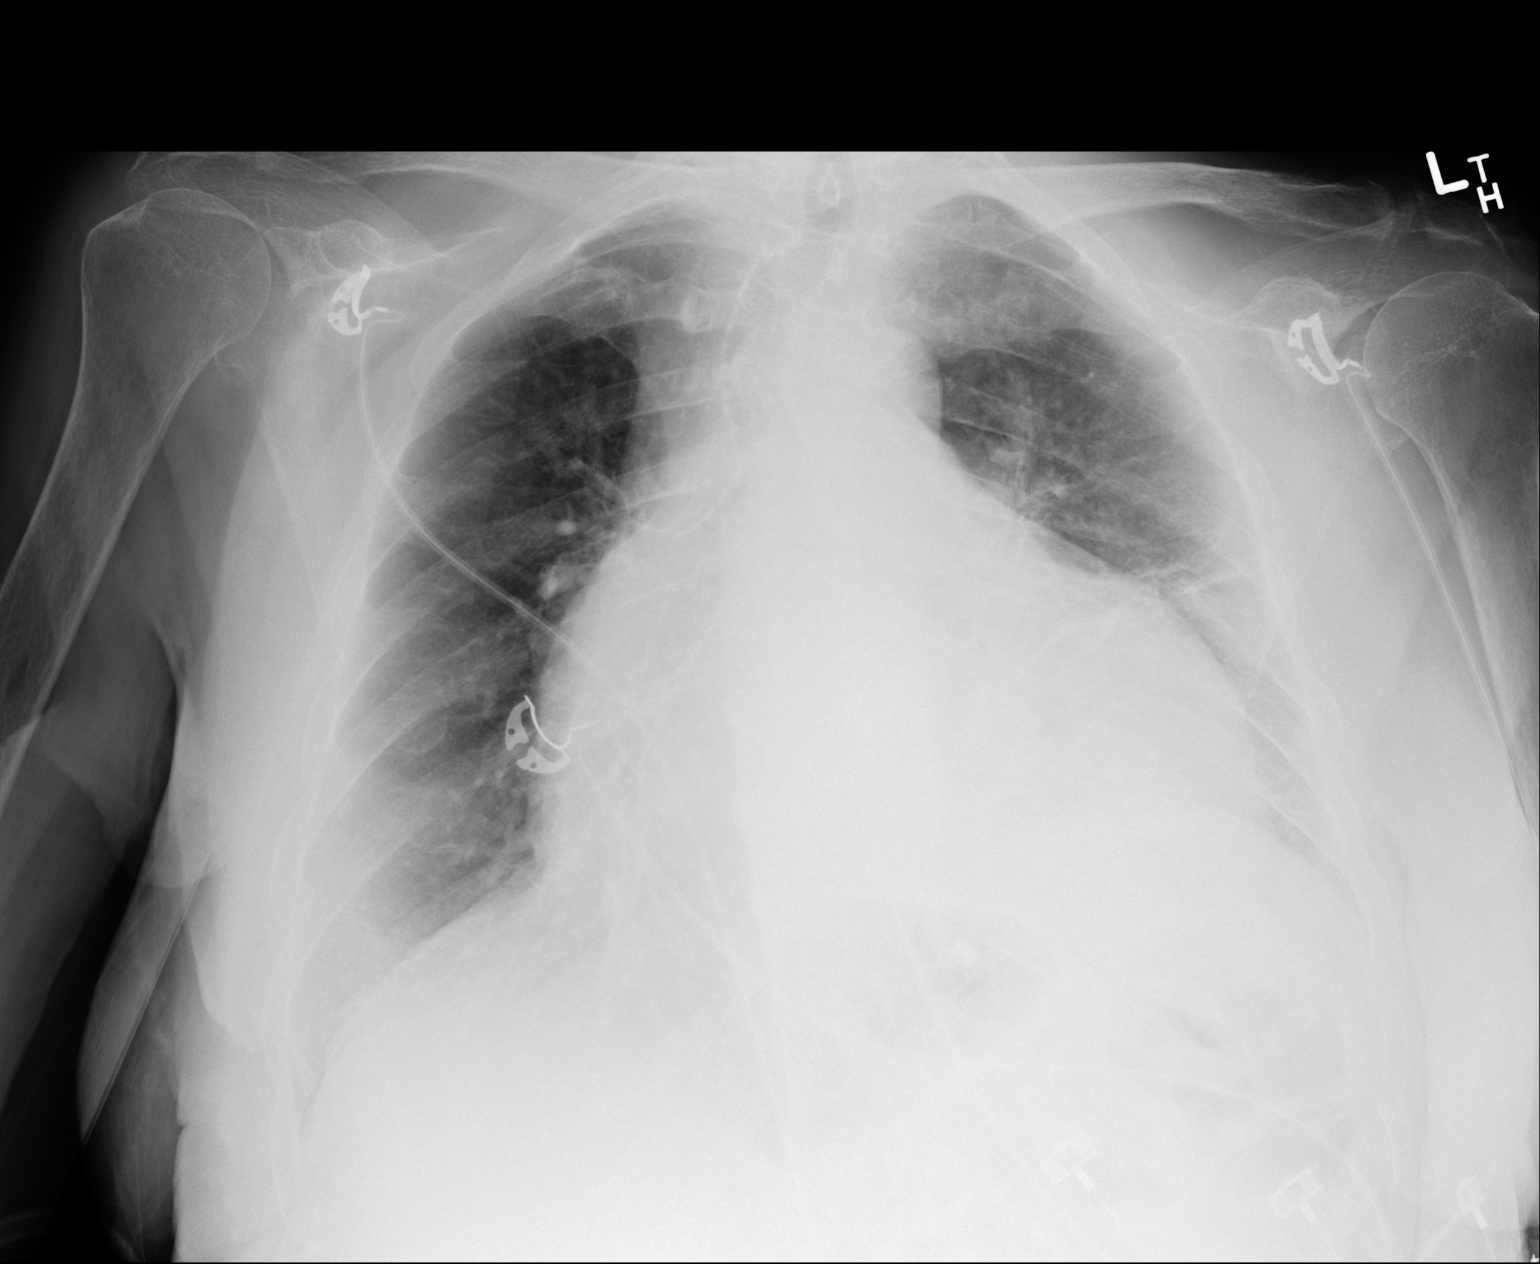

[1 of 1 positions shown; findings below may reference images not displayed]

FINDINGS: There is marked cardiomegaly, unchanged. There is a linear scarring
in the left base, unchanged. There is mild vascular congestion and
minimal perihilar ground-glass opacity. There is no large effusion
IMPRESSION: Marked cardiomegaly. Mild congestive changes in the central
vasculature.

## 2017-06-22 NOTE — Progress Notes (Signed)
Referring Provider: Gareth Morgan, MD Primary Care Physician:  Gareth Morgan, MD Primary GI: Dr. Darrick Penna   Chief Complaint  Patient presents with  . Constipation  . Diarrhea  . gastroparesis    HPI:   [name] is a 66 y.o. female presenting today with a history of chronic abdominal pain, gastroparesis, IBS-D, history of IDA. EGD in May 2016 due to anemia and history of Barrett's without evidence of Barrett's. Last colonoscopy in May 2015. BPE hasnoted small to moderate sized sliding type hiatal hernia with spontaneous and inducible reflux, nonspecific esophageal dysmotility. Capsule in April 2015 with idiopathic small bowel ulcers that have been present since 2013.   Labs with Hematology in Aug 2018 with drop in ferritin to 19, down from 105 10 months ago. She has not been on oral iron for 2 years. Hgb stable at 12.2. Thorough evaluation for IDA in the past. Weight remains stable. No overt GI bleeding. Daughter present with her today. Notes taking Imodium up to 3 times per day. Most nights she "messes her clothes up". She notes that she feels the sensation to go but can't make it in time. Notes no BM until around 5pm in the evening. Bentyl TID. Usually the most diarrhea is 3x/day. Not eating popcorn any more. Abdominal cramping with bowel movements. She is on metformin.   Gastroparesis symptoms at baseline.     Past Medical History:  Diagnosis Date  . Anemia    H/H of 10/30 with a normal MCV in 12/09  . Anxiety   . Arthritis   . Barrett's esophagus    Diagnosed 1995. Last EGD 2016-NO BARRETT'S.   . Chest pain    Negative cardiac catheterization in 2002; negative stress nuclear study in 2008  . Chronic anticoagulation   . Chronic diastolic CHF (congestive heart failure) (HCC)    a. EF predominantly normal during prior echoes but was 40% during 10/2014 echo. b. Most recent 01/2015 EF normal, 55-60%.  . Chronic LBP    Surgical intervention in 1996  . Diabetes mellitus,  type 2 (HCC)    Insulin therapy; exacerbated by prednisone  . Dysrhythmia    AFib  . Gastroparesis    99% retention 05/2008 on GES  . GERD (gastroesophageal reflux disease)   . Hiatal hernia   . Hyperlipidemia   . Hypertension   . Hypothyroid   . IBS (irritable bowel syndrome)   . Obesity   . OSA on CPAP    had CPAP and cannot tolerate.  . Paroxysmal atrial fibrillation (HCC)   . Pulmonary hypertension (HCC) 01/2015   a. Predominantly pulmonary venous hypertension but may be component of PAH.  . Seizures (HCC)    last seizure was 2 years ago; on keppra for this; unknown etiology  . Syncope    a. Admitted 05/2009; magnetic resonance imagin/ MRA - negative; etiology thought to be orthostasis secondary to drugs and dehydration. b. Syncope 02/2015 also felt 2/2 dehydration.    Past Surgical History:  Procedure Laterality Date  . BACK SURGERY  1996  . BIOPSY N/A 11/08/2013   Procedure: BIOPSY  / Tissue sampling / ulcers present in small intestine;  Surgeon: West Bali, MD;  Location: [facility];  Service: Endoscopy;  Laterality: N/A;  . CARDIAC CATHETERIZATION  2002  . CARDIAC CATHETERIZATION N/A 01/26/2015   Procedure: Right Heart Cath;  Surgeon: Laurey Morale, MD;  Location: [facility] INVASIVE CV LAB;  Service: Cardiovascular;  Laterality: N/A;  . CARDIOVASCULAR  STRESS TEST  2008   Stress nuclear study  . CARDIOVERSION N/A 03/06/2015   Procedure: CARDIOVERSION;  Surgeon: Laurey Morale, MD;  Location: [facility] ENDOSCOPY;  Service: Cardiovascular;  Laterality: N/A;  . CARPAL TUNNEL RELEASE  1994  . COLONOSCOPY  11/2011   Dr. Darrick Penna: Internal hemorrhoids, mild diverticulosis. Random colon biopsies negative.  . COLONOSCOPY N/A 11/08/2013   SLF: Normal mucosa in the terminal ileum/The colon IS redundant/  Moderate diverticulosis throughout the entire colon. ileum bx benign. colon bx benign  . ESOPHAGOGASTRODUODENOSCOPY  2008   Barrett's without dysplasia. esphagus dilated. antral erosions,  h.pylori serologies negative.  . ESOPHAGOGASTRODUODENOSCOPY  11/2011   Dr. Darrick Penna: Barrett's esophagus, mild gastritis, diverticulum in the second portion of the duodenum repeat EGD 3 years. Small bowel biopsies negative. Gastric biopsy show reactive gastropathy but no H. pylori. Esophageal biopsies consistent with GERD. Next EGD 11/2014  . ESOPHAGOGASTRODUODENOSCOPY N/A 11/21/2014   ESL:PNPY non-erosive gastritis/irregular z-line  . GIVENS CAPSULE STUDY  12/07/2011   Proximal small bowel, rare AVM. Distal small bowel, multiple ulcers noted  . GIVENS CAPSULE STUDY N/A 09/27/2013   Distal small bowel ulcers extending to TI.  Marland Kitchen GIVENS CAPSULE STUDY N/A 10/10/2013   Procedure: GIVENS CAPSULE STUDY;  Surgeon: West Bali, MD;  Location: [facility];  Service: Endoscopy;  Laterality: N/A;  7:30  . KNEE ARTHROSCOPY WITH MEDIAL MENISECTOMY Right 06/09/2016   Procedure: KNEE ARTHROSCOPY WITH MEDIAL MENISECTOMY;  Surgeon: Vickki Hearing, MD;  Location: [facility];  Service: Orthopedics;  Laterality: Right;  medial and lateral menisectomy  . LAMINECTOMY  1995   L4-L5  . LAPAROSCOPIC CHOLECYSTECTOMY  1990s  . LEFT HEART CATHETERIZATION WITH CORONARY ANGIOGRAM  01/10/2014   Procedure: LEFT HEART CATHETERIZATION WITH CORONARY ANGIOGRAM;  Surgeon: Lesleigh Noe, MD;  Location: [facility] CATH LAB;  Service: Cardiovascular;;  . RIGHT HEART CATHETERIZATION N/A 01/10/2014   Procedure: RIGHT HEART CATH;  Surgeon: Lesleigh Noe, MD;  Location: [facility] CATH LAB;  Service: Cardiovascular;  Laterality: N/A;  . TOTAL ABDOMINAL HYSTERECTOMY  1999    Current Outpatient Medications  Medication Sig Dispense Refill  . acetaminophen (TYLENOL) 500 MG tablet Take 500 mg by mouth every 6 (six) hours as needed for moderate pain or headache.    . albuterol (PROVENTIL HFA;VENTOLIN HFA) 108 (90 Base) MCG/ACT inhaler Inhale 2 puffs into the lungs every 4 (four) hours as needed for wheezing or shortness of breath. 1 Inhaler 0  .  amiodarone (PACERONE) 200 MG tablet Take 1 tablet (200 mg total) by mouth every morning. 90 tablet 3  . apixaban (ELIQUIS) 5 MG TABS tablet Take 1 tablet (5 mg total) by mouth 2 (two) times daily. 60 tablet 0  . dicyclomine (BENTYL) 10 MG capsule TAKE 1 TO 2 CAPSULES BY MOUTH BEFORE meals AS NEEDED 180 capsule 1  . DULoxetine (CYMBALTA) 30 MG capsule Take 30 mg by mouth 2 (two) times daily.    . famotidine (PEPCID) 20 MG tablet Take 20 mg by mouth at bedtime.     . furosemide (LASIX) 40 MG tablet TAKE 40 MG DAILY. 90 tablet 3  . gabapentin (NEURONTIN) 300 MG capsule Take 300 mg by mouth 3 (three) times daily.    Marland Kitchen glipiZIDE (GLUCOTROL) 10 MG tablet Take 10 mg by mouth 2 (two) times daily before a meal.     . Insulin Glargine (LANTUS SOLOSTAR) 100 UNIT/ML Solostar Pen Inject 20-50 Units into the skin at bedtime as needed. Patient takes 8  units every night at bedtime if over 178.  Takes 50 units if her blood sugar is above 300.    . levETIRAcetam (KEPPRA) 750 MG tablet Take 750 mg by mouth 2 (two) times daily.    Marland Kitchen levothyroxine (SYNTHROID, LEVOTHROID) 137 MCG tablet Take 137 mcg by mouth daily.    Marland Kitchen loperamide (IMODIUM) 2 MG capsule TAKE ONE CAPSULE BY MOUTH TWICE DAILY AS NEEDED 60 capsule 3  . LORazepam (ATIVAN) 1 MG tablet Take 1 mg by mouth 3 (three) times daily as needed for anxiety or sleep. For anxiety    . magnesium oxide (MAG-OX) 400 MG tablet Take 400 mg by mouth daily.     . metaxalone (SKELAXIN) 800 MG tablet Take 800 mg by mouth 3 (three) times daily.     . metFORMIN (GLUCOPHAGE) 500 MG tablet Take 1,000 mg by mouth 2 (two) times daily with a meal.     . Multiple Vitamin (MULTIVITAMIN WITH MINERALS) TABS tablet Take 1 tablet by mouth daily.    . nitroGLYCERIN (NITROSTAT) 0.4 MG SL tablet Place 0.4 mg under the tongue every 5 (five) minutes as needed for chest pain. Reported on 07/08/2015    . ondansetron (ZOFRAN) 4 MG tablet TAKE ONE TABLET BY MOUTH FOUR TIMES DAILY FOR NAUSEA (Patient  taking differently: TAKE ONE TABLET BY MOUTH FOUR TIMES DAILY FOR NAUSEA. Takes as needed) 120 tablet 3  . pantoprazole (PROTONIX) 40 MG tablet TAKE ONE TABLET BY MOUTH TWICE DAILY BEFORE APPLY MEAL 180 tablet 3  . potassium chloride (K-DUR,KLOR-CON) 10 MEQ tablet TAKE TWO TABLETS BY MOUTH TWICE DAILY 120 tablet 11  . promethazine (PHENERGAN) 12.5 MG tablet Take 1 tablet (12.5 mg total) by mouth every 4 (four) hours as needed for nausea or vomiting. 42 tablet 0  . vitamin B-12 (CYANOCOBALAMIN) 500 MCG tablet Take 500 mcg by mouth daily.     No current facility-administered medications for this visit.     Allergies as of 06/22/2017 - Review Complete 06/22/2017  Allergen Reaction Noted  . Ibuprofen Other (See Comments) 10/13/2013  . Codeine Nausea And Vomiting and Other (See Comments)   . Dilaudid [hydromorphone hcl] Other (See Comments) 12/31/2015  . Celexa [citalopram hydrobromide] Other (See Comments) 06/30/2013  . Reglan [metoclopramide] Other (See Comments) 06/04/2014    Family History  Problem Relation Age of Onset  . Hypertension Mother   . Alzheimer's disease Mother   . Stroke Mother   . Heart attack Mother   . Hypertension Other   . Breast cancer Sister   . Heart disease Neg Hx   . Colon cancer Neg Hx     Social History   Socioeconomic History  . Marital status: Married    Spouse name: Not on file  . Number of children: Not on file  . Years of education: Not on file  . Highest education level: Not on file  Social Needs  . Financial resource strain: Not on file  . Food insecurity - worry: Not on file  . Food insecurity - inability: Not on file  . Transportation needs - medical: Not on file  . Transportation needs - non-medical: Not on file  Occupational History  . Occupation: Passenger transport manager at [employer]    Employer: [employer]    Comment: disabled    Employer: RETIRED  Tobacco Use  . Smoking status: Former Smoker    Packs/day: 0.25    Years: 15.00    Pack years: 3.75      Types: Cigarettes  Start date: 02/26/1966    Last attempt to quit: 07/01/1983    Years since quitting: 34.0  . Smokeless tobacco: Never Used  . Tobacco comment: quit in 1984  Substance and Sexual Activity  . Alcohol use: No    Alcohol/week: 0.0 oz  . Drug use: No  . Sexual activity: Yes    Birth control/protection: None, Surgical  Other Topics Concern  . Not on file  Social History Narrative   Sedentary   4 children, "blended family"    Review of Systems: Gen: Denies fever, chills, anorexia. Denies fatigue, weakness, weight loss.  CV: Denies chest pain, palpitations, syncope, peripheral edema, and claudication. Resp: Denies dyspnea at rest, cough, wheezing, coughing up blood, and pleurisy. GI: see HPI  Derm: Denies rash, itching, dry skin Psych: Denies depression, anxiety, memory loss, confusion. No homicidal or suicidal ideation.  Heme: Denies bruising, bleeding, and enlarged lymph nodes.  Physical Exam: BP 113/64   Pulse 60   Temp 98.5 F (36.9 C) (Oral)   Ht 5\' 6"  (1.676 m)   Wt 241 lb 9.6 oz (109.6 kg)   BMI 39.00 kg/m  General:   Alert and oriented. No distress noted. Pleasant and cooperative.  Head:  Normocephalic and atraumatic. Eyes:  Conjuctiva clear without scleral icterus. Mouth:  Oral mucosa pink and moist.  Abdomen:  +BS, soft, non-tender and non-distended. No rebound or guarding. No HSM or masses noted. Msk:  Symmetrical without gross deformities. Normal posture. Extremities:  Without edema. Neurologic:  Alert and  oriented x4 Psych:  Alert and cooperative. Normal mood and affect.

## 2017-06-22 NOTE — Patient Instructions (Signed)
I am reaching out to Hematology about the plan for iron. We may need to recheck this now.   Please complete the stool studies. We can then decide on the best treatment. I think we may need to update your colonoscopy.  I will have you come back in 3 months regardless, but we will be working on things in the meantime!  Have a great Christmas!

## 2017-06-25 ENCOUNTER — Telehealth: Payer: Self-pay | Admitting: Gastroenterology

## 2017-06-25 NOTE — Telephone Encounter (Signed)
Reviewed chart. Can we see how long she has been on metformin? It may be helpful to talk with PCP about a different agent, as a classic side effect of this is diarrhea. Still recommend checking stool studies. If these are negative, I will give a round of Xifaxan.

## 2017-06-25 NOTE — Assessment & Plan Note (Signed)
Noting 3 loose stools a day, usually starting later in the day. Notes nocturnal diarrhea and incontinence, which usually occurs due to urgency and unable to make it to bathroom in time. No weight loss. As previously asked, requesting stool studies. Colonoscopy on file from 2015. I note that she is on metformin: I will ask that she discuss with PCP changing this agent so we can eliminate anything that could be exacerbating symptoms. Diarrhea likely multifactorial. May benefit from round of Xifaxan: I am requesting stool studies first. Return in 3 months.

## 2017-06-25 NOTE — Assessment & Plan Note (Signed)
Prior extensive evaluation. Has not been able to take oral iron. Most recent ferritin with drop from months prior. Anticipate need for IV iron in future. No overt GI bleeding.

## 2017-06-26 NOTE — Telephone Encounter (Signed)
LMOM for a return call.  

## 2017-06-26 NOTE — Progress Notes (Signed)
CC'D TO PCP °

## 2017-06-29 NOTE — Telephone Encounter (Signed)
LMOM to call. Mailing a letter to call.

## 2017-07-05 NOTE — Telephone Encounter (Signed)
[name], [name], called. She said pt has been on the metformin for many years. It has just been the past couple of years that she has had increase in BM's. She will have her mom do the stool samples next week when she will be able to get them to the lab.

## 2017-07-10 ENCOUNTER — Other Ambulatory Visit: Payer: Self-pay | Admitting: Gastroenterology

## 2017-08-05 ENCOUNTER — Encounter (HOSPITAL_COMMUNITY): Payer: Self-pay

## 2017-08-05 ENCOUNTER — Inpatient Hospital Stay (HOSPITAL_COMMUNITY)
Admission: EM | Admit: 2017-08-05 | Discharge: 2017-08-22 | DRG: 207 | Disposition: A | Discharge status: Other Acute Inpatient Hospital | Payer: Medicare Other | Attending: Internal Medicine | Admitting: Internal Medicine

## 2017-08-05 ENCOUNTER — Other Ambulatory Visit: Payer: Self-pay

## 2017-08-05 ENCOUNTER — Emergency Department (HOSPITAL_COMMUNITY): Payer: Medicare Other

## 2017-08-05 DIAGNOSIS — J9690 Respiratory failure, unspecified, unspecified whether with hypoxia or hypercapnia: Secondary | ICD-10-CM

## 2017-08-05 DIAGNOSIS — Z0189 Encounter for other specified special examinations: Secondary | ICD-10-CM

## 2017-08-05 DIAGNOSIS — I272 Pulmonary hypertension, unspecified: Secondary | ICD-10-CM | POA: Diagnosis present

## 2017-08-05 DIAGNOSIS — K589 Irritable bowel syndrome without diarrhea: Secondary | ICD-10-CM | POA: Diagnosis present

## 2017-08-05 DIAGNOSIS — J9602 Acute respiratory failure with hypercapnia: Secondary | ICD-10-CM

## 2017-08-05 DIAGNOSIS — J441 Chronic obstructive pulmonary disease with (acute) exacerbation: Secondary | ICD-10-CM

## 2017-08-05 DIAGNOSIS — I5033 Acute on chronic diastolic (congestive) heart failure: Secondary | ICD-10-CM | POA: Diagnosis not present

## 2017-08-05 DIAGNOSIS — J189 Pneumonia, unspecified organism: Secondary | ICD-10-CM | POA: Diagnosis present

## 2017-08-05 DIAGNOSIS — I509 Heart failure, unspecified: Secondary | ICD-10-CM

## 2017-08-05 DIAGNOSIS — I4891 Unspecified atrial fibrillation: Secondary | ICD-10-CM | POA: Diagnosis present

## 2017-08-05 DIAGNOSIS — G40909 Epilepsy, unspecified, not intractable, without status epilepticus: Secondary | ICD-10-CM | POA: Diagnosis present

## 2017-08-05 DIAGNOSIS — E11649 Type 2 diabetes mellitus with hypoglycemia without coma: Secondary | ICD-10-CM | POA: Diagnosis present

## 2017-08-05 DIAGNOSIS — R251 Tremor, unspecified: Secondary | ICD-10-CM

## 2017-08-05 DIAGNOSIS — J440 Chronic obstructive pulmonary disease with acute lower respiratory infection: Secondary | ICD-10-CM | POA: Diagnosis present

## 2017-08-05 DIAGNOSIS — K3184 Gastroparesis: Secondary | ICD-10-CM | POA: Diagnosis not present

## 2017-08-05 DIAGNOSIS — Z515 Encounter for palliative care: Secondary | ICD-10-CM

## 2017-08-05 DIAGNOSIS — Z931 Gastrostomy status: Secondary | ICD-10-CM

## 2017-08-05 DIAGNOSIS — E662 Morbid (severe) obesity with alveolar hypoventilation: Secondary | ICD-10-CM | POA: Diagnosis present

## 2017-08-05 DIAGNOSIS — J9621 Acute and chronic respiratory failure with hypoxia: Secondary | ICD-10-CM | POA: Diagnosis present

## 2017-08-05 DIAGNOSIS — Z7189 Other specified counseling: Secondary | ICD-10-CM

## 2017-08-05 DIAGNOSIS — I110 Hypertensive heart disease with heart failure: Secondary | ICD-10-CM | POA: Diagnosis present

## 2017-08-05 DIAGNOSIS — I482 Chronic atrial fibrillation: Secondary | ICD-10-CM

## 2017-08-05 DIAGNOSIS — E785 Hyperlipidemia, unspecified: Secondary | ICD-10-CM | POA: Diagnosis present

## 2017-08-05 DIAGNOSIS — J9601 Acute respiratory failure with hypoxia: Secondary | ICD-10-CM | POA: Diagnosis not present

## 2017-08-05 DIAGNOSIS — E039 Hypothyroidism, unspecified: Secondary | ICD-10-CM | POA: Diagnosis present

## 2017-08-05 DIAGNOSIS — I959 Hypotension, unspecified: Secondary | ICD-10-CM | POA: Diagnosis present

## 2017-08-05 DIAGNOSIS — I5032 Chronic diastolic (congestive) heart failure: Secondary | ICD-10-CM

## 2017-08-05 DIAGNOSIS — J181 Lobar pneumonia, unspecified organism: Secondary | ICD-10-CM | POA: Diagnosis present

## 2017-08-05 DIAGNOSIS — E1143 Type 2 diabetes mellitus with diabetic autonomic (poly)neuropathy: Secondary | ICD-10-CM | POA: Diagnosis present

## 2017-08-05 DIAGNOSIS — Z7901 Long term (current) use of anticoagulants: Secondary | ICD-10-CM

## 2017-08-05 DIAGNOSIS — G9340 Encephalopathy, unspecified: Secondary | ICD-10-CM | POA: Diagnosis not present

## 2017-08-05 DIAGNOSIS — Z4659 Encounter for fitting and adjustment of other gastrointestinal appliance and device: Secondary | ICD-10-CM

## 2017-08-05 DIAGNOSIS — Z79890 Hormone replacement therapy (postmenopausal): Secondary | ICD-10-CM

## 2017-08-05 DIAGNOSIS — R6889 Other general symptoms and signs: Secondary | ICD-10-CM

## 2017-08-05 DIAGNOSIS — E782 Mixed hyperlipidemia: Secondary | ICD-10-CM | POA: Diagnosis not present

## 2017-08-05 DIAGNOSIS — J9600 Acute respiratory failure, unspecified whether with hypoxia or hypercapnia: Secondary | ICD-10-CM

## 2017-08-05 DIAGNOSIS — Z87891 Personal history of nicotine dependence: Secondary | ICD-10-CM

## 2017-08-05 DIAGNOSIS — J9622 Acute and chronic respiratory failure with hypercapnia: Secondary | ICD-10-CM | POA: Diagnosis present

## 2017-08-05 DIAGNOSIS — E1165 Type 2 diabetes mellitus with hyperglycemia: Secondary | ICD-10-CM | POA: Diagnosis present

## 2017-08-05 DIAGNOSIS — Z978 Presence of other specified devices: Secondary | ICD-10-CM

## 2017-08-05 DIAGNOSIS — Z794 Long term (current) use of insulin: Secondary | ICD-10-CM | POA: Diagnosis not present

## 2017-08-05 DIAGNOSIS — K219 Gastro-esophageal reflux disease without esophagitis: Secondary | ICD-10-CM | POA: Diagnosis present

## 2017-08-05 DIAGNOSIS — E119 Type 2 diabetes mellitus without complications: Secondary | ICD-10-CM

## 2017-08-05 DIAGNOSIS — Z8249 Family history of ischemic heart disease and other diseases of the circulatory system: Secondary | ICD-10-CM | POA: Diagnosis not present

## 2017-08-05 DIAGNOSIS — I10 Essential (primary) hypertension: Secondary | ICD-10-CM | POA: Diagnosis not present

## 2017-08-05 DIAGNOSIS — I480 Paroxysmal atrial fibrillation: Secondary | ICD-10-CM | POA: Diagnosis present

## 2017-08-05 DIAGNOSIS — G4730 Sleep apnea, unspecified: Secondary | ICD-10-CM | POA: Diagnosis present

## 2017-08-05 DIAGNOSIS — F0390 Unspecified dementia without behavioral disturbance: Secondary | ICD-10-CM | POA: Diagnosis present

## 2017-08-05 DIAGNOSIS — I361 Nonrheumatic tricuspid (valve) insufficiency: Secondary | ICD-10-CM | POA: Diagnosis not present

## 2017-08-05 DIAGNOSIS — E038 Other specified hypothyroidism: Secondary | ICD-10-CM | POA: Diagnosis not present

## 2017-08-05 DIAGNOSIS — E876 Hypokalemia: Secondary | ICD-10-CM | POA: Diagnosis not present

## 2017-08-05 DIAGNOSIS — G4733 Obstructive sleep apnea (adult) (pediatric): Secondary | ICD-10-CM | POA: Diagnosis present

## 2017-08-05 DIAGNOSIS — R0602 Shortness of breath: Secondary | ICD-10-CM | POA: Diagnosis present

## 2017-08-05 DIAGNOSIS — Z6836 Body mass index (BMI) 36.0-36.9, adult: Secondary | ICD-10-CM

## 2017-08-05 LAB — I-STAT TROPONIN, ED: Troponin i, poc: 0.07 ng/mL (ref 0.00–0.08)

## 2017-08-05 LAB — RESPIRATORY PANEL BY PCR
Adenovirus: NOT DETECTED
Bordetella pertussis: NOT DETECTED
CORONAVIRUS OC43-RVPPCR: NOT DETECTED
Chlamydophila pneumoniae: NOT DETECTED
Coronavirus 229E: NOT DETECTED
Coronavirus HKU1: NOT DETECTED
Coronavirus NL63: NOT DETECTED
INFLUENZA A-RVPPCR: NOT DETECTED
INFLUENZA B-RVPPCR: NOT DETECTED
METAPNEUMOVIRUS-RVPPCR: NOT DETECTED
MYCOPLASMA PNEUMONIAE-RVPPCR: NOT DETECTED
PARAINFLUENZA VIRUS 1-RVPPCR: NOT DETECTED
PARAINFLUENZA VIRUS 2-RVPPCR: NOT DETECTED
PARAINFLUENZA VIRUS 4-RVPPCR: NOT DETECTED
Parainfluenza Virus 3: NOT DETECTED
RESPIRATORY SYNCYTIAL VIRUS-RVPPCR: NOT DETECTED
Rhinovirus / Enterovirus: NOT DETECTED

## 2017-08-05 LAB — CBC
HEMATOCRIT: 40.5 % (ref 36.0–46.0)
Hemoglobin: 12.5 g/dL (ref 12.0–15.0)
MCH: 31.5 pg (ref 26.0–34.0)
MCHC: 30.9 g/dL (ref 30.0–36.0)
MCV: 102 fL — AB (ref 78.0–100.0)
Platelets: 174 10*3/uL (ref 150–400)
RBC: 3.97 MIL/uL (ref 3.87–5.11)
RDW: 14.9 % (ref 11.5–15.5)
WBC: 6.8 10*3/uL (ref 4.0–10.5)

## 2017-08-05 LAB — BRAIN NATRIURETIC PEPTIDE: B Natriuretic Peptide: 189 pg/mL — ABNORMAL HIGH (ref 0.0–100.0)

## 2017-08-05 LAB — BASIC METABOLIC PANEL
ANION GAP: 12 (ref 5–15)
BUN: 15 mg/dL (ref 6–20)
CALCIUM: 8.4 mg/dL — AB (ref 8.9–10.3)
CO2: 28 mmol/L (ref 22–32)
Chloride: 95 mmol/L — ABNORMAL LOW (ref 101–111)
Creatinine, Ser: 1.11 mg/dL — ABNORMAL HIGH (ref 0.44–1.00)
GFR calc Af Amer: 58 mL/min — ABNORMAL LOW (ref >60)
GFR, EST NON AFRICAN AMERICAN: 50 mL/min — AB (ref >60)
GLUCOSE: 236 mg/dL — AB (ref 65–99)
Potassium: 3.9 mmol/L (ref 3.5–5.1)
Sodium: 135 mmol/L (ref 135–145)

## 2017-08-05 LAB — GLUCOSE, CAPILLARY
Glucose-Capillary: 210 mg/dL — ABNORMAL HIGH (ref 65–99)
Glucose-Capillary: 197 mg/dL — ABNORMAL HIGH (ref 65–99)
Glucose-Capillary: 323 mg/dL — ABNORMAL HIGH (ref 65–99)

## 2017-08-05 LAB — STREP PNEUMONIAE URINARY ANTIGEN: Strep Pneumo Urinary Antigen: NEGATIVE

## 2017-08-05 MED ORDER — LEVOTHYROXINE SODIUM 25 MCG PO TABS
137.0000 ug | ORAL_TABLET | Freq: Every day | ORAL | Status: DC
  Administered 2017-08-06 – 2017-08-13 (×7): 137 ug via ORAL
  Filled 2017-08-05 (×8): qty 1

## 2017-08-05 MED ORDER — METAXALONE 800 MG PO TABS
800.0000 mg | ORAL_TABLET | Freq: Three times a day (TID) | ORAL | Status: DC
  Administered 2017-08-05 – 2017-08-07 (×8): 800 mg via ORAL
  Filled 2017-08-05 (×14): qty 1

## 2017-08-05 MED ORDER — MAGNESIUM OXIDE 400 (241.3 MG) MG PO TABS
400.0000 mg | ORAL_TABLET | Freq: Every day | ORAL | Status: DC
  Administered 2017-08-06 – 2017-08-22 (×15): 400 mg via ORAL
  Filled 2017-08-05 (×16): qty 1

## 2017-08-05 MED ORDER — NITROGLYCERIN 0.4 MG SL SUBL: 0.4000 mg | SUBLINGUAL_TABLET | SUBLINGUAL | Status: DC | PRN

## 2017-08-05 MED ORDER — GABAPENTIN 400 MG PO CAPS
800.0000 mg | ORAL_CAPSULE | Freq: Every day | ORAL | Status: DC
  Administered 2017-08-05 – 2017-08-12 (×7): 800 mg via ORAL
  Filled 2017-08-05 (×7): qty 2

## 2017-08-05 MED ORDER — DEXTROSE 5 % IV SOLN
1.0000 g | INTRAVENOUS | Status: DC
  Administered 2017-08-06 – 2017-08-07 (×2): 1 g via INTRAVENOUS
  Filled 2017-08-05 (×4): qty 10

## 2017-08-05 MED ORDER — LOPERAMIDE HCL 2 MG PO CAPS
2.0000 mg | ORAL_CAPSULE | Freq: Two times a day (BID) | ORAL | Status: DC | PRN
  Administered 2017-08-10 – 2017-08-12 (×3): 2 mg via ORAL
  Filled 2017-08-05 (×3): qty 1

## 2017-08-05 MED ORDER — DEXTROMETHORPHAN POLISTIREX ER 30 MG/5ML PO SUER
30.0000 mg | Freq: Two times a day (BID) | ORAL | Status: DC
  Administered 2017-08-05 – 2017-08-22 (×32): 30 mg via ORAL
  Filled 2017-08-05 (×37): qty 5

## 2017-08-05 MED ORDER — POTASSIUM CHLORIDE CRYS ER 20 MEQ PO TBCR
20.0000 meq | EXTENDED_RELEASE_TABLET | Freq: Every day | ORAL | Status: DC
  Administered 2017-08-05 – 2017-08-12 (×7): 20 meq via ORAL
  Filled 2017-08-05 (×8): qty 1

## 2017-08-05 MED ORDER — CEFTRIAXONE SODIUM 1 G IJ SOLR
1.0000 g | Freq: Once | INTRAMUSCULAR | Status: AC
  Administered 2017-08-05: 1 g via INTRAVENOUS
  Filled 2017-08-05: qty 10

## 2017-08-05 MED ORDER — ACETAMINOPHEN 325 MG PO TABS
ORAL_TABLET | ORAL | Status: AC
  Administered 2017-08-05: 650 mg
  Filled 2017-08-05: qty 2

## 2017-08-05 MED ORDER — IPRATROPIUM-ALBUTEROL 0.5-2.5 (3) MG/3ML IN SOLN
3.0000 mL | RESPIRATORY_TRACT | Status: AC
  Administered 2017-08-05 – 2017-08-09 (×24): 3 mL via RESPIRATORY_TRACT
  Filled 2017-08-05 (×25): qty 3

## 2017-08-05 MED ORDER — ADULT MULTIVITAMIN W/MINERALS CH
1.0000 | ORAL_TABLET | Freq: Every day | ORAL | Status: DC
  Administered 2017-08-06 – 2017-08-13 (×7): 1 via ORAL
  Filled 2017-08-05 (×8): qty 1

## 2017-08-05 MED ORDER — DULOXETINE HCL 30 MG PO CPEP
30.0000 mg | ORAL_CAPSULE | Freq: Two times a day (BID) | ORAL | Status: DC
  Administered 2017-08-05 – 2017-08-12 (×13): 30 mg via ORAL
  Filled 2017-08-05 (×14): qty 1

## 2017-08-05 MED ORDER — INSULIN ASPART 100 UNIT/ML ~~LOC~~ SOLN
4.0000 [IU] | Freq: Three times a day (TID) | SUBCUTANEOUS | Status: DC
  Administered 2017-08-05 (×2): 4 [IU] via SUBCUTANEOUS

## 2017-08-05 MED ORDER — LEVETIRACETAM 500 MG PO TABS
1000.0000 mg | ORAL_TABLET | Freq: Two times a day (BID) | ORAL | Status: DC
  Administered 2017-08-05 – 2017-08-13 (×14): 1000 mg via ORAL
  Filled 2017-08-05 (×15): qty 2

## 2017-08-05 MED ORDER — FUROSEMIDE 40 MG PO TABS
40.0000 mg | ORAL_TABLET | Freq: Every day | ORAL | Status: DC
  Administered 2017-08-06 – 2017-08-07 (×2): 40 mg via ORAL
  Filled 2017-08-05 (×3): qty 1

## 2017-08-05 MED ORDER — GABAPENTIN 400 MG PO CAPS
400.0000 mg | ORAL_CAPSULE | Freq: Two times a day (BID) | ORAL | Status: DC
  Administered 2017-08-05 – 2017-08-13 (×16): 400 mg via ORAL
  Filled 2017-08-05 (×17): qty 1

## 2017-08-05 MED ORDER — LORAZEPAM 1 MG PO TABS
1.0000 mg | ORAL_TABLET | Freq: Three times a day (TID) | ORAL | Status: DC | PRN
  Administered 2017-08-07: 1 mg via ORAL
  Filled 2017-08-05: qty 1

## 2017-08-05 MED ORDER — AZITHROMYCIN 250 MG PO TABS
500.0000 mg | ORAL_TABLET | Freq: Once | ORAL | Status: AC
  Administered 2017-08-05: 500 mg via ORAL
  Filled 2017-08-05: qty 2

## 2017-08-05 MED ORDER — APIXABAN 5 MG PO TABS
5.0000 mg | ORAL_TABLET | Freq: Two times a day (BID) | ORAL | Status: DC
  Administered 2017-08-05 – 2017-08-22 (×31): 5 mg via ORAL
  Filled 2017-08-05 (×32): qty 1

## 2017-08-05 MED ORDER — METHYLPREDNISOLONE SODIUM SUCC 40 MG IJ SOLR
40.0000 mg | Freq: Two times a day (BID) | INTRAMUSCULAR | Status: DC
  Administered 2017-08-05 – 2017-08-17 (×24): 40 mg via INTRAVENOUS
  Filled 2017-08-05 (×24): qty 1

## 2017-08-05 MED ORDER — INSULIN GLARGINE 100 UNIT/ML ~~LOC~~ SOLN
20.0000 [IU] | Freq: Every day | SUBCUTANEOUS | Status: DC
  Administered 2017-08-05: 20 [IU] via SUBCUTANEOUS
  Filled 2017-08-05 (×2): qty 0.2

## 2017-08-05 MED ORDER — PANTOPRAZOLE SODIUM 40 MG PO TBEC
40.0000 mg | DELAYED_RELEASE_TABLET | Freq: Two times a day (BID) | ORAL | Status: DC
  Administered 2017-08-05 – 2017-08-07 (×5): 40 mg via ORAL
  Filled 2017-08-05 (×6): qty 1

## 2017-08-05 MED ORDER — INSULIN ASPART 100 UNIT/ML ~~LOC~~ SOLN
0.0000 [IU] | Freq: Three times a day (TID) | SUBCUTANEOUS | Status: DC
  Administered 2017-08-05: 5 [IU] via SUBCUTANEOUS
  Administered 2017-08-05: 3 [IU] via SUBCUTANEOUS
  Administered 2017-08-06 (×3): 8 [IU] via SUBCUTANEOUS
  Administered 2017-08-07: 15 [IU] via SUBCUTANEOUS
  Administered 2017-08-07 (×2): 8 [IU] via SUBCUTANEOUS
  Administered 2017-08-08 (×2): 11 [IU] via SUBCUTANEOUS

## 2017-08-05 MED ORDER — AZITHROMYCIN 250 MG PO TABS
500.0000 mg | ORAL_TABLET | ORAL | Status: DC
  Administered 2017-08-06 – 2017-08-07 (×2): 500 mg via ORAL
  Filled 2017-08-05 (×2): qty 2

## 2017-08-05 MED ORDER — FAMOTIDINE 20 MG PO TABS
20.0000 mg | ORAL_TABLET | Freq: Every day | ORAL | Status: DC
  Administered 2017-08-05 – 2017-08-07 (×3): 20 mg via ORAL
  Filled 2017-08-05 (×4): qty 1

## 2017-08-05 MED ORDER — DICYCLOMINE HCL 10 MG PO CAPS
10.0000 mg | ORAL_CAPSULE | Freq: Three times a day (TID) | ORAL | Status: DC | PRN
  Filled 2017-08-05 (×3): qty 1

## 2017-08-05 MED ORDER — AMIODARONE HCL 200 MG PO TABS
200.0000 mg | ORAL_TABLET | Freq: Every morning | ORAL | Status: DC
  Administered 2017-08-06 – 2017-08-22 (×15): 200 mg via ORAL
  Filled 2017-08-05 (×17): qty 1

## 2017-08-05 NOTE — ED Notes (Signed)
ED Provider at bedside. 

## 2017-08-05 NOTE — ED Notes (Signed)
Report to Daniel, RN

## 2017-08-05 NOTE — ED Triage Notes (Signed)
Patient reports of shortness of breath since last night. States she has had cough x2 weeks. Currently taking 2nd round of Amoxicillin. Has swelling noted to bilateral legs, patients states they look normal and is taking her fluid pill as directed.

## 2017-08-05 NOTE — ED Notes (Signed)
Call for report 

## 2017-08-05 NOTE — ED Provider Notes (Addendum)
[facility] EMERGENCY DEPARTMENT Provider Note   CSN: [id number] Arrival date & time: 08/05/17  7829     History   Chief Complaint Chief Complaint  Patient presents with  . Shortness of Breath    HPI [name] is a 67 y.o. female.  HPI Patient presents to the emergency room for evaluation of shortness of breath.  Patient states she has had a cough for the past 2 weeks.  Her husband was recently ill with a respiratory illness.  She saw her primary doctor and was started on antibiotics.  Patient states since that time her symptoms have not really improved.  She is on her second round of amoxicillin and last night she started to feel more shortness of breath.  Patient states she has chronic swelling in her right leg but denies any significant change in the swelling of her legs.  She has been taking her medications.  She denies any chest pain.  No history of DVT or PE.  She is on chronic anticoagulation.  She does have a history of chronic CHF.  She has also noted that her blood sugars have been more elevated than usual. Past Medical History:  Diagnosis Date  . Anemia    H/H of 10/30 with a normal MCV in 12/09  . Anxiety   . Arthritis   . Barrett's esophagus    Diagnosed 1995. Last EGD 2016-NO BARRETT'S.   . Chest pain    Negative cardiac catheterization in 2002; negative stress nuclear study in 2008  . Chronic anticoagulation   . Chronic diastolic CHF (congestive heart failure) (HCC)    a. EF predominantly normal during prior echoes but was 40% during 10/2014 echo. b. Most recent 01/2015 EF normal, 55-60%.  . Chronic LBP    Surgical intervention in 1996  . Diabetes mellitus, type 2 (HCC)    Insulin therapy; exacerbated by prednisone  . Dysrhythmia    AFib  . Gastroparesis    99% retention 05/2008 on GES  . GERD (gastroesophageal reflux disease)   . Hiatal hernia   . Hyperlipidemia   . Hypertension   . Hypothyroid   . IBS (irritable bowel syndrome)   . Obesity   . OSA on  CPAP    had CPAP and cannot tolerate.  . Paroxysmal atrial fibrillation (HCC)   . Pulmonary hypertension (HCC) 01/2015   a. Predominantly pulmonary venous hypertension but may be component of PAH.  . Seizures (HCC)    last seizure was 2 years ago; on keppra for this; unknown etiology  . Syncope    a. Admitted 05/2009; magnetic resonance imagin/ MRA - negative; etiology thought to be orthostasis secondary to drugs and dehydration. b. Syncope 02/2015 also felt 2/2 dehydration.    Patient Active Problem List   Diagnosis Date Noted  . Pulmonary hypertension (HCC) 04/05/2017  . Derangement of posterior horn of medial meniscus of right knee   . Lateral meniscus, anterior horn derangement, right   . Syncope, near 12/26/2015  . Fall at home 12/26/2015  . Dysphagia 12/07/2015  . IDA (iron deficiency anemia) 03/13/2015  . Chronic diastolic CHF (congestive heart failure) (HCC) 02/20/2015  . UTI (urinary tract infection) 02/07/2015  . Syncope and collapse 02/05/2015  . Dehydration   . Acute on chronic combined systolic and diastolic CHF (congestive heart failure) (HCC) 01/26/2015  . Low hemoglobin   . Diffuse abdominal pain 09/16/2014  . Ascites 08/27/2014  . Anemia 07/20/2014  . Epistaxis, recurrent 07/20/2014  . Syncope  04/11/2014  . Dementia 04/11/2014  . Chronic diarrhea 04/11/2014  . DM type 2 (diabetes mellitus, type 2) (HCC) 04/11/2014  . Protein-calorie malnutrition, severe (HCC) 04/11/2014  . Acute CHF (HCC) 12/21/2013  . Atrial fibrillation with RVR (HCC) 12/21/2013  . Chest pain at rest 12/03/2013  . Obesity 12/03/2013  . Chest pain 12/03/2013  . Diarrhea 08/26/2013  . Abnormal weight loss 08/26/2013  . Lower abdominal pain 08/26/2013  . Anorexia nervosa 08/26/2013  . Encounter for therapeutic drug monitoring 08/26/2013  . Dyspnea 06/29/2013  . Tremor 10/27/2012  . Chronic anticoagulation 06/23/2011  . HYPOTENSION, ORTHOSTATIC 08/26/2009  . Hyperlipidemia 03/04/2009  .  Barrett's esophagus 09/26/2008  . Gastroparesis 09/26/2008  . Iron deficiency anemia 09/25/2008  . Essential hypertension 09/25/2008  . LUNG NODULE 09/25/2008  . Irritable bowel syndrome 09/25/2008  . SLEEP APNEA 09/25/2008  . CARPAL TUNNEL SYNDROME, HX OF 09/25/2008  . Hypothyroidism 04/10/2008  . DIABETES MELLITUS, TYPE II, ON INSULIN 04/10/2008  . OBESITY 04/10/2008  . Atrial fibrillation (HCC) 04/10/2008  . Congestive heart failure (HCC) 04/10/2008  . LOW BACK PAIN, CHRONIC 04/10/2008  . CHEST PAIN-PRECORDIAL 04/10/2008    Past Surgical History:  Procedure Laterality Date  . BACK SURGERY  1996  . BIOPSY N/A 11/08/2013   Procedure: BIOPSY  / Tissue sampling / ulcers present in small intestine;  Surgeon: West Bali, MD;  Location: [facility] ENDO SUITE;  Service: Endoscopy;  Laterality: N/A;  . CARDIAC CATHETERIZATION  2002  . CARDIAC CATHETERIZATION N/A 01/26/2015   Procedure: Right Heart Cath;  Surgeon: Laurey Morale, MD;  Location: [facility] INVASIVE CV LAB;  Service: Cardiovascular;  Laterality: N/A;  . CARDIOVASCULAR STRESS TEST  2008   Stress nuclear study  . CARDIOVERSION N/A 03/06/2015   Procedure: CARDIOVERSION;  Surgeon: Laurey Morale, MD;  Location: [facility] ENDOSCOPY;  Service: Cardiovascular;  Laterality: N/A;  . CARPAL TUNNEL RELEASE  1994  . COLONOSCOPY  11/2011   Dr. Darrick Penna: Internal hemorrhoids, mild diverticulosis. Random colon biopsies negative.  . COLONOSCOPY N/A 11/08/2013   SLF: Normal mucosa in the terminal ileum/The colon IS redundant/  Moderate diverticulosis throughout the entire colon. ileum bx benign. colon bx benign  . ESOPHAGOGASTRODUODENOSCOPY  2008   Barrett's without dysplasia. esphagus dilated. antral erosions, h.pylori serologies negative.  . ESOPHAGOGASTRODUODENOSCOPY  11/2011   Dr. Darrick Penna: Barrett's esophagus, mild gastritis, diverticulum in the second portion of the duodenum repeat EGD 3 years. Small bowel biopsies negative. Gastric biopsy show reactive  gastropathy but no H. pylori. Esophageal biopsies consistent with GERD. Next EGD 11/2014  . ESOPHAGOGASTRODUODENOSCOPY N/A 11/21/2014   POL:IDCV non-erosive gastritis/irregular z-line  . GIVENS CAPSULE STUDY  12/07/2011   Proximal small bowel, rare AVM. Distal small bowel, multiple ulcers noted  . GIVENS CAPSULE STUDY N/A 09/27/2013   Distal small bowel ulcers extending to TI.  Marland Kitchen GIVENS CAPSULE STUDY N/A 10/10/2013   Procedure: GIVENS CAPSULE STUDY;  Surgeon: West Bali, MD;  Location: [facility] ENDO SUITE;  Service: Endoscopy;  Laterality: N/A;  7:30  . KNEE ARTHROSCOPY WITH MEDIAL MENISECTOMY Right 06/09/2016   Procedure: KNEE ARTHROSCOPY WITH MEDIAL MENISECTOMY;  Surgeon: Vickki Hearing, MD;  Location: [facility] ORS;  Service: Orthopedics;  Laterality: Right;  medial and lateral menisectomy  . LAMINECTOMY  1995   L4-L5  . LAPAROSCOPIC CHOLECYSTECTOMY  1990s  . LEFT HEART CATHETERIZATION WITH CORONARY ANGIOGRAM  01/10/2014   Procedure: LEFT HEART CATHETERIZATION WITH CORONARY ANGIOGRAM;  Surgeon: Lesleigh Noe, MD;  Location: [facility] CATH  LAB;  Service: Cardiovascular;;  . RIGHT HEART CATHETERIZATION N/A 01/10/2014   Procedure: RIGHT HEART CATH;  Surgeon: Lesleigh Noe, MD;  Location: [facility] CATH LAB;  Service: Cardiovascular;  Laterality: N/A;  . TOTAL ABDOMINAL HYSTERECTOMY  1999    OB History    Gravida Para Term Preterm AB Living   2 2 2          SAB TAB Ectopic Multiple Live Births                   Home Medications    Prior to Admission medications   Medication Sig Start Date End Date Taking? Authorizing Provider  acetaminophen (TYLENOL) 500 MG tablet Take 500 mg by mouth every 6 (six) hours as needed for moderate pain or headache.    [provider]  albuterol (PROVENTIL HFA;VENTOLIN HFA) 108 (90 Base) MCG/ACT inhaler Inhale 2 puffs into the lungs every 4 (four) hours as needed for wheezing or shortness of breath. 08/19/16   Samuel Jester, DO  amiodarone (PACERONE) 200 MG  tablet Take 1 tablet (200 mg total) by mouth every morning. 03/24/17   Branch, Dorothe Pea, MD  apixaban (ELIQUIS) 5 MG TABS tablet Take 1 tablet (5 mg total) by mouth 2 (two) times daily. 06/09/16   Vickki Hearing, MD  dicyclomine (BENTYL) 10 MG capsule TAKE 1 TO 2 CAPSULES BY MOUTH BEFORE meals AS NEEDED 07/10/17   Tiffany Kocher, PA-C  DULoxetine (CYMBALTA) 30 MG capsule Take 30 mg by mouth 2 (two) times daily.    [provider]  famotidine (PEPCID) 20 MG tablet Take 20 mg by mouth at bedtime.     [provider]  furosemide (LASIX) 40 MG tablet TAKE 40 MG DAILY. 03/24/17   Antoine Poche, MD  gabapentin (NEURONTIN) 300 MG capsule Take 300 mg by mouth 3 (three) times daily.    [provider]  glipiZIDE (GLUCOTROL) 10 MG tablet Take 10 mg by mouth 2 (two) times daily before a meal.     [provider]  Insulin Glargine (LANTUS SOLOSTAR) 100 UNIT/ML Solostar Pen Inject 20-50 Units into the skin at bedtime as needed. Patient takes 20 units every night at bedtime if over 178.  Takes 50 units if her blood sugar is above 300.    [provider]  levETIRAcetam (KEPPRA) 750 MG tablet Take 750 mg by mouth 2 (two) times daily.    [provider]  levothyroxine (SYNTHROID, LEVOTHROID) 137 MCG tablet Take 137 mcg by mouth daily. 10/14/14   [provider]  loperamide (IMODIUM) 2 MG capsule TAKE ONE CAPSULE BY MOUTH TWICE DAILY AS NEEDED 05/08/17   Gelene Mink, NP  LORazepam (ATIVAN) 1 MG tablet Take 1 mg by mouth 3 (three) times daily as needed for anxiety or sleep. For anxiety    [provider]  magnesium oxide (MAG-OX) 400 MG tablet Take 400 mg by mouth daily.     [provider]  metaxalone (SKELAXIN) 800 MG tablet Take 800 mg by mouth 3 (three) times daily.  10/04/14   [provider]  metFORMIN (GLUCOPHAGE) 500 MG tablet Take 1,000 mg by mouth 2 (two) times daily with a meal.     [provider]    Multiple Vitamin (MULTIVITAMIN WITH MINERALS) TABS tablet Take 1 tablet by mouth daily.    [provider]  nitroGLYCERIN (NITROSTAT) 0.4 MG SL tablet Place 0.4 mg under the tongue every 5 (five) minutes as needed for  chest pain. Reported on 07/08/2015    [provider]  ondansetron (ZOFRAN) 4 MG tablet TAKE ONE TABLET BY MOUTH FOUR TIMES DAILY FOR NAUSEA Patient taking differently: TAKE ONE TABLET BY MOUTH FOUR TIMES DAILY FOR NAUSEA. Takes as needed 06/08/17   Anice Paganini, NP  pantoprazole (PROTONIX) 40 MG tablet TAKE ONE TABLET BY MOUTH TWICE DAILY BEFORE APPLY MEAL 02/07/17   Gelene Mink, NP  potassium chloride (K-DUR,KLOR-CON) 10 MEQ tablet TAKE TWO TABLETS BY MOUTH TWICE DAILY 04/20/17   Antoine Poche, MD  promethazine (PHENERGAN) 12.5 MG tablet Take 1 tablet (12.5 mg total) by mouth every 4 (four) hours as needed for nausea or vomiting. 06/09/16   Vickki Hearing, MD  vitamin B-12 (CYANOCOBALAMIN) 500 MCG tablet Take 500 mcg by mouth daily.    [provider]    Family History Family History  Problem Relation Age of Onset  . Hypertension Mother   . Alzheimer's disease Mother   . Stroke Mother   . Heart attack Mother   . Hypertension Other   . Breast cancer Sister   . Heart disease Neg Hx   . Colon cancer Neg Hx     Social History Social History   Tobacco Use  . Smoking status: Former Smoker    Packs/day: 0.25    Years: 15.00    Pack years: 3.75    Types: Cigarettes    Start date: 02/26/1966    Last attempt to quit: 07/01/1983    Years since quitting: 34.1  . Smokeless tobacco: Never Used  . Tobacco comment: quit in 1984  Substance Use Topics  . Alcohol use: No    Alcohol/week: 0.0 oz  . Drug use: No     Allergies   Ibuprofen; Codeine; Dilaudid [hydromorphone hcl]; Celexa [citalopram hydrobromide]; and Reglan [metoclopramide]   Review of Systems Review of Systems  All other systems reviewed and are negative.    Physical  Exam Updated Vital Signs BP (!) 161/76   Pulse 84   Temp 98.7 F (37.1 C) (Oral)   Resp (!) 26   Ht 1.727 m (5\' 8" )   Wt 102.1 kg (225 lb)   SpO2 94%   BMI 34.21 kg/m   Physical Exam  Constitutional: No distress.  Obese  HENT:  Head: Normocephalic and atraumatic.  Right Ear: External ear normal.  Left Ear: External ear normal.  Eyes: Conjunctivae are normal. Right eye exhibits no discharge. Left eye exhibits no discharge. No scleral icterus.  Neck: Neck supple. No tracheal deviation present.  Cardiovascular: Normal rate, regular rhythm and intact distal pulses.  Pulmonary/Chest: Breath sounds normal. Accessory muscle usage present. No stridor. Tachypnea noted. No respiratory distress. She has no wheezes. She has no rales.  Abdominal: Soft. Bowel sounds are normal. She exhibits no distension. There is no tenderness. There is no rebound and no guarding.  Musculoskeletal: She exhibits no tenderness.       Right lower leg: She exhibits edema. She exhibits no tenderness.       Left lower leg: She exhibits no tenderness and no edema.  Neurological: She is alert. She has normal strength. No cranial nerve deficit (no facial droop, extraocular movements intact, no slurred speech) or sensory deficit. She exhibits normal muscle tone. She displays no seizure activity. Coordination normal.  Skin: Skin is warm and dry. No rash noted.  Psychiatric: She has a normal mood and affect.  Nursing note and vitals reviewed.    ED Treatments / Results  Labs (all labs ordered are listed, but only abnormal results are displayed) Labs Reviewed  BASIC METABOLIC PANEL - Abnormal; Notable for the following components:      Result Value   Chloride 95 (*)    Glucose, Bld 236 (*)    Creatinine, Ser 1.11 (*)    Calcium 8.4 (*)    GFR calc non Af Amer 50 (*)    GFR calc Af Amer 58 (*)    All other components within normal limits  CBC - Abnormal; Notable for the following components:   MCV 102.0 (*)     All other components within normal limits  BRAIN NATRIURETIC PEPTIDE - Abnormal; Notable for the following components:   B Natriuretic Peptide 189.0 (*)    All other components within normal limits  I-STAT TROPONIN, ED    EKG  EKG Interpretation  Date/Time:  Saturday August 05 2017 09:58:37 EST Ventricular Rate:  82 PR Interval:    QRS Duration: 119 QT Interval:  412 QTC Calculation: 482 R Axis:   -54 Text Interpretation:  Sinus rhythm Prolonged PR interval Left anterior fascicular block Nonspecific T abnormalities, lateral leads No significant change since last tracing Confirmed by Linwood Dibbles [id number]) on 08/05/2017 10:07:17 AM       Radiology Dg Chest 2 View  Result Date: 08/05/2017 CLINICAL DATA:  Shortness of breath.  Cough. EXAM: CHEST  2 VIEW COMPARISON:  September 30, 2016 FINDINGS: No pneumothorax. Stable cardiomegaly. The hila and mediastinum are unchanged. There appears to be increased opacity in the left retrocardiac region which could represent pneumonia given history. Recommend follow-up to resolution. IMPRESSION: Left retrocardiac opacity may represent pneumonia given history. Recommend follow-up to resolution. Electronically Signed   By: Gerome Sam III M.D   On: 08/05/2017 09:43    Procedures Procedures (including critical care time)  Medications Ordered in ED Medications  cefTRIAXone (ROCEPHIN) 1 g in dextrose 5 % 50 mL IVPB (1 g Intravenous New Bag/Given 08/05/17 1022)  azithromycin (ZITHROMAX) tablet 500 mg (500 mg Oral Given 08/05/17 1022)  acetaminophen (TYLENOL) 325 MG tablet (650 mg  Given 08/05/17 1022)     Initial Impression / Assessment and Plan / ED Course  I have reviewed the triage vital signs and the nursing notes.  Pertinent labs & imaging results that were available during my care of the patient were reviewed by me and considered in my medical decision making (see chart for details).  Clinical Course as of Aug 05 1034  Sat Aug 05, 2017  1032  Slightly elevated BNP but I doubt clinically significant B Natriuretic Peptide: (!) 189.0 [JK]  1033 CBC is unremarkable.  Electrolyte panel is unremarkable  [JK]    Clinical Course User Index [JK] Linwood Dibbles, MD  Patient presents to emergency room with complaints of worsening shortness of breath.  She has been treated for pneumonia empirically.  No signs of congestive heart failure.  Patient is on anticoagulation making pulmonary embolism unlikely.  X-ray today shows probable pneumonia in the left lung.  Patient has persistent tachypnea.  She continues to feel short of breath and has failed outpatient treatment of pneumonia.  I will consult with medical service for admission.   Final Clinical Impressions(s) / ED Diagnoses   Final diagnoses:  Community acquired pneumonia of left lower lobe of lung [facility])       Linwood Dibbles, MD 08/05/17 1036  Discussed case with Dr Laural Benes.   Linwood Dibbles, MD 08/05/17 1051

## 2017-08-05 NOTE — H&P (Signed)
History and Physical  [name] ZOX:[id number] DOB: 11/16/1950 DOA: 08/05/2017  Referring physician: Lynelle Doctor PCP: Gareth Morgan, MD   Chief Complaint: cough, SOB  HPI: [name] is a 67 y.o. female with multiple medical comorbidities detailed below presented to the emergency department with persistent cough and shortness of breath for the past 2 weeks that failed outpatient management with 2 courses of oral amoxicillin.  Patient reports that she is on her second round of amoxicillin and continues to feel short of breath.  She has nonproductive cough.  She has some wheezing.  She has no chest pain.  The patient has no swelling in the extremities.  She has a congestive heart failure that is chronic and she is taking her diuretics regularly.  She denies palpitations.  She presented to the ED and noted to be tachypneic.  She was afebrile.  A chest x-ray was obtained that did show a large left retro-cardiac opacity that was suspicious for pneumonia.  EKG was unremarkable except for notation of prolonged PR interval.  Her troponin was 0.07.  She had a mildly elevated BNP at 189.  The patient's blood sugar was elevated at 236.  She was given treatment in the emergency department but remained tachypneic.  She is going to be admitted for community-acquired pneumonia that has failed outpatient treatment.  Review of Systems: All systems reviewed and apart from history of presenting illness, are negative.  Past Medical History:  Diagnosis Date  . Anemia    H/H of 10/30 with a normal MCV in 12/09  . Anxiety   . Arthritis   . Barrett's esophagus    Diagnosed 1995. Last EGD 2016-NO BARRETT'S.   . Chest pain    Negative cardiac catheterization in 2002; negative stress nuclear study in 2008  . Chronic anticoagulation   . Chronic diastolic CHF (congestive heart failure) (HCC)    a. EF predominantly normal during prior echoes but was 40% during 10/2014 echo. b. Most recent 01/2015 EF normal, 55-60%.    . Chronic LBP    Surgical intervention in 1996  . Diabetes mellitus, type 2 (HCC)    Insulin therapy; exacerbated by prednisone  . Dysrhythmia    AFib  . Gastroparesis    99% retention 05/2008 on GES  . GERD (gastroesophageal reflux disease)   . Hiatal hernia   . Hyperlipidemia   . Hypertension   . Hypothyroid   . IBS (irritable bowel syndrome)   . Obesity   . OSA on CPAP    had CPAP and cannot tolerate.  . Paroxysmal atrial fibrillation (HCC)   . Pulmonary hypertension (HCC) 01/2015   a. Predominantly pulmonary venous hypertension but may be component of PAH.  . Seizures (HCC)    last seizure was 2 years ago; on keppra for this; unknown etiology  . Syncope    a. Admitted 05/2009; magnetic resonance imagin/ MRA - negative; etiology thought to be orthostasis secondary to drugs and dehydration. b. Syncope 02/2015 also felt 2/2 dehydration.   Past Surgical History:  Procedure Laterality Date  . BACK SURGERY  1996  . BIOPSY N/A 11/08/2013   Procedure: BIOPSY  / Tissue sampling / ulcers present in small intestine;  Surgeon: West Bali, MD;  Location: [facility];  Service: Endoscopy;  Laterality: N/A;  . CARDIAC CATHETERIZATION  2002  . CARDIAC CATHETERIZATION N/A 01/26/2015   Procedure: Right Heart Cath;  Surgeon: Laurey Morale, MD;  Location: [facility] INVASIVE CV LAB;  Service:  Cardiovascular;  Laterality: N/A;  . CARDIOVASCULAR STRESS TEST  2008   Stress nuclear study  . CARDIOVERSION N/A 03/06/2015   Procedure: CARDIOVERSION;  Surgeon: Laurey Morale, MD;  Location: [facility] ENDOSCOPY;  Service: Cardiovascular;  Laterality: N/A;  . CARPAL TUNNEL RELEASE  1994  . COLONOSCOPY  11/2011   Dr. Darrick Penna: Internal hemorrhoids, mild diverticulosis. Random colon biopsies negative.  . COLONOSCOPY N/A 11/08/2013   SLF: Normal mucosa in the terminal ileum/The colon IS redundant/  Moderate diverticulosis throughout the entire colon. ileum bx benign. colon bx benign  . ESOPHAGOGASTRODUODENOSCOPY  2008    Barrett's without dysplasia. esphagus dilated. antral erosions, h.pylori serologies negative.  . ESOPHAGOGASTRODUODENOSCOPY  11/2011   Dr. Darrick Penna: Barrett's esophagus, mild gastritis, diverticulum in the second portion of the duodenum repeat EGD 3 years. Small bowel biopsies negative. Gastric biopsy show reactive gastropathy but no H. pylori. Esophageal biopsies consistent with GERD. Next EGD 11/2014  . ESOPHAGOGASTRODUODENOSCOPY N/A 11/21/2014   BJY:NWGN non-erosive gastritis/irregular z-line  . GIVENS CAPSULE STUDY  12/07/2011   Proximal small bowel, rare AVM. Distal small bowel, multiple ulcers noted  . GIVENS CAPSULE STUDY N/A 09/27/2013   Distal small bowel ulcers extending to TI.  Marland Kitchen GIVENS CAPSULE STUDY N/A 10/10/2013   Procedure: GIVENS CAPSULE STUDY;  Surgeon: West Bali, MD;  Location: [facility];  Service: Endoscopy;  Laterality: N/A;  7:30  . KNEE ARTHROSCOPY WITH MEDIAL MENISECTOMY Right 06/09/2016   Procedure: KNEE ARTHROSCOPY WITH MEDIAL MENISECTOMY;  Surgeon: Vickki Hearing, MD;  Location: [facility] ORS;  Service: Orthopedics;  Laterality: Right;  medial and lateral menisectomy  . LAMINECTOMY  1995   L4-L5  . LAPAROSCOPIC CHOLECYSTECTOMY  1990s  . LEFT HEART CATHETERIZATION WITH CORONARY ANGIOGRAM  01/10/2014   Procedure: LEFT HEART CATHETERIZATION WITH CORONARY ANGIOGRAM;  Surgeon: Lesleigh Noe, MD;  Location: [facility] CATH LAB;  Service: Cardiovascular;;  . RIGHT HEART CATHETERIZATION N/A 01/10/2014   Procedure: RIGHT HEART CATH;  Surgeon: Lesleigh Noe, MD;  Location: [facility] CATH LAB;  Service: Cardiovascular;  Laterality: N/A;  . TOTAL ABDOMINAL HYSTERECTOMY  1999   Social History:  reports that she quit smoking about 34 years ago. Her smoking use included cigarettes. She started smoking about 51 years ago. She has a 3.75 pack-year smoking history. she has never used smokeless tobacco. She reports that she does not drink alcohol or use drugs.  Allergies  Allergen Reactions  .  Ibuprofen Other (See Comments)    Upsets her stomach and causes abdominal pain  . Codeine Nausea And Vomiting and Other (See Comments)    HALLUCINATIONS  . Dilaudid [Hydromorphone Hcl] Other (See Comments)    Made her pass out  . Celexa [Citalopram Hydrobromide] Other (See Comments)    Dyskinesia  . Reglan [Metoclopramide] Other (See Comments)    DYSKINESIA    Family History  Problem Relation Age of Onset  . Hypertension Mother   . Alzheimer's disease Mother   . Stroke Mother   . Heart attack Mother   . Hypertension Other   . Breast cancer Sister   . Heart disease Neg Hx   . Colon cancer Neg Hx     Prior to Admission medications   Medication Sig Start Date End Date Taking? Authorizing Provider  acetaminophen (TYLENOL) 500 MG tablet Take 500 mg by mouth every 6 (six) hours as needed for moderate pain or headache.   Yes [provider]  albuterol (PROVENTIL HFA;VENTOLIN HFA) 108 (90 Base) MCG/ACT inhaler  Inhale 2 puffs into the lungs every 4 (four) hours as needed for wheezing or shortness of breath. 08/19/16  Yes Samuel Jester, DO  amiodarone (PACERONE) 200 MG tablet Take 1 tablet (200 mg total) by mouth every morning. 03/24/17  Yes Branch, Dorothe Pea, MD  amoxicillin (AMOXIL) 500 MG capsule Take 1 capsule by mouth 3 (three) times daily. 08/01/17  Yes [provider]  apixaban (ELIQUIS) 5 MG TABS tablet Take 1 tablet (5 mg total) by mouth 2 (two) times daily. 06/09/16  Yes Vickki Hearing, MD  Cyanocobalamin (B-12) 5000 MCG SUBL Take 1 tablet by mouth daily. 06/20/17  Yes [provider]  dicyclomine (BENTYL) 10 MG capsule TAKE 1 TO 2 CAPSULES BY MOUTH BEFORE meals AS NEEDED 07/10/17  Yes Tiffany Kocher, PA-C  DULoxetine (CYMBALTA) 30 MG capsule Take 30 mg by mouth 2 (two) times daily.   Yes [provider]  famotidine (PEPCID) 20 MG tablet Take 20 mg by mouth at bedtime.    Yes [provider]  furosemide (LASIX) 40 MG tablet TAKE 40  MG DAILY. 03/24/17  Yes BranchDorothe Pea, MD  gabapentin (NEURONTIN) 400 MG capsule Take 400-800 mg by mouth 3 (three) times daily. Take 1 capsule(400mg ) by mouth twice a day and take 2 capsules(800mg ) by mouth at bedtime 07/18/17  Yes [provider]  glipiZIDE (GLUCOTROL) 10 MG tablet Take 10 mg by mouth 2 (two) times daily before a meal.    Yes [provider]  Insulin Glargine (LANTUS SOLOSTAR) 100 UNIT/ML Solostar Pen Inject 20-50 Units into the skin at bedtime as needed. Patient takes 20 units every night at bedtime if over 178.  Takes 50 units if her blood sugar is above 300.   Yes [provider]  levETIRAcetam (KEPPRA) 1000 MG tablet Take 1,000 mg by mouth 2 (two) times daily. 07/18/17  Yes [provider]  levothyroxine (SYNTHROID, LEVOTHROID) 137 MCG tablet Take 137 mcg by mouth daily. 10/14/14  Yes [provider]  loperamide (IMODIUM) 2 MG capsule TAKE ONE CAPSULE BY MOUTH TWICE DAILY AS NEEDED 05/08/17  Yes Gelene Mink, NP  LORazepam (ATIVAN) 1 MG tablet Take 1 mg by mouth 3 (three) times daily as needed for anxiety or sleep. For anxiety   Yes [provider]  magnesium oxide (MAG-OX) 400 MG tablet Take 400 mg by mouth daily.    Yes [provider]  metaxalone (SKELAXIN) 800 MG tablet Take 800 mg by mouth 3 (three) times daily.  10/04/14  Yes [provider]  metFORMIN (GLUCOPHAGE) 1000 MG tablet Take 1,000 mg by mouth 2 (two) times daily with a meal.    Yes [provider]  Multiple Vitamin (MULTIVITAMIN WITH MINERALS) TABS tablet Take 1 tablet by mouth daily.   Yes [provider]  nitroGLYCERIN (NITROSTAT) 0.4 MG SL tablet Place 0.4 mg under the tongue every 5 (five) minutes as needed for chest pain. Reported on 07/08/2015   Yes [provider]  ondansetron (ZOFRAN) 4 MG tablet TAKE ONE TABLET BY MOUTH FOUR TIMES DAILY FOR NAUSEA Patient taking differently: TAKE ONE TABLET BY MOUTH FOUR TIMES  DAILY FOR NAUSEA. Takes as needed 06/08/17  Yes Anice Paganini, NP  pantoprazole (PROTONIX) 40 MG tablet TAKE ONE TABLET BY MOUTH TWICE DAILY BEFORE APPLY MEAL 02/07/17  Yes Gelene Mink, NP  potassium chloride (K-DUR,KLOR-CON) 10 MEQ tablet TAKE TWO TABLETS BY MOUTH TWICE DAILY 04/20/17  Yes Branch, Dorothe Pea, MD  promethazine [facility])  12.5 MG tablet Take 1 tablet (12.5 mg total) by mouth every 4 (four) hours as needed for nausea or vomiting. 06/09/16  Yes Vickki Hearing, MD  UNABLE TO FIND Take 1 tablet by mouth 2 (two) times daily. Medication Name: [employer] RX Multivitamin Diabetes Support 06/20/17  Yes [provider]   Physical Exam: Vitals:   08/05/17 0930 08/05/17 0945 08/05/17 1000 08/05/17 1140  BP: (!) 158/83  (!) 161/76   Pulse:  87 84   Resp: (!) 28 15 (!) 26   Temp:      TempSrc:      SpO2:  93% 94%   Weight:    110.8 kg (244 lb 4.3 oz)  Height:    5\' 8"  (1.727 m)     General exam: Moderately built and nourished patient, lying comfortably supine on the gurney in no obvious distress.  Head, eyes and ENT: Nontraumatic and normocephalic. Pupils equally reacting to light and accommodation. Oral mucosa moist.  Neck: Supple. No JVD, carotid bruit or thyromegaly.  Lymphatics: No lymphadenopathy.  Respiratory system: Mild tachypnea noted with diffuse inspiratory/expiratory wheezing.  Cardiovascular system: S1 and S2 heard. No JVD, murmurs, gallops, clicks or pedal edema.  Gastrointestinal system: Abdomen is nondistended, soft and nontender. Normal bowel sounds heard. No organomegaly or masses appreciated.  Central nervous system: Alert and oriented. No focal neurological deficits.  Extremities: Symmetric 5 x 5 power. Peripheral pulses symmetrically felt.  Mild edema bilateral lower extremities.  Skin: No rashes or acute findings.  Musculoskeletal system: Negative exam.  Psychiatry: Pleasant and cooperative.  Labs on Admission:  Basic Metabolic  Panel: Recent Labs  Lab 08/05/17 0847  NA 135  K 3.9  CL 95*  CO2 28  GLUCOSE 236*  BUN 15  CREATININE 1.11*  CALCIUM 8.4*   Liver Function Tests: No results for input(s): AST, ALT, ALKPHOS, BILITOT, PROT, ALBUMIN in the last 168 hours. No results for input(s): LIPASE, AMYLASE in the last 168 hours. No results for input(s): AMMONIA in the last 168 hours. CBC: Recent Labs  Lab 08/05/17 0847  WBC 6.8  HGB 12.5  HCT 40.5  MCV 102.0*  PLT 174   Cardiac Enzymes: No results for input(s): CKTOTAL, CKMB, CKMBINDEX, TROPONINI in the last 168 hours.  BNP (last 3 results) No results for input(s): PROBNP in the last 8760 hours. CBG: No results for input(s): GLUCAP in the last 168 hours.  Radiological Exams on Admission: Dg Chest 2 View  Result Date: 08/05/2017 CLINICAL DATA:  Shortness of breath.  Cough. EXAM: CHEST  2 VIEW COMPARISON:  September 30, 2016 FINDINGS: No pneumothorax. Stable cardiomegaly. The hila and mediastinum are unchanged. There appears to be increased opacity in the left retrocardiac region which could represent pneumonia given history. Recommend follow-up to resolution. IMPRESSION: Left retrocardiac opacity may represent pneumonia given history. Recommend follow-up to resolution. Electronically Signed   By: Gerome Sam III M.D   On: 08/05/2017 09:43   EKG: Independently reviewed.  Normal sinus rhythm with PR interval prolonged  Assessment/Plan Principal Problem:   CAP Failed Outpatient Tx Active Problems:   Hypothyroidism   Hyperlipidemia   Essential hypertension   Atrial fibrillation (HCC)   Congestive heart failure (HCC)   Gastroparesis   Sleep apnea   Chronic anticoagulation   Tremor   Dementia   DM type 2 (diabetes mellitus, type 2) (HCC)   Pulmonary hypertension (HCC)  1. Community-acquired pneumonia that failed outpatient management-admit for MedSurg bed, IV antibiotics with ceftriaxone and azithromycin  orally has been ordered.  Oxygen support  with supplemental O2.  Nebulizer treatments.  Cough syrup.  Continue supportive therapy. 2. Acute COPD Exacerbation -patient has diffuse wheezing and with her former smoking history I am concerned about COPD.  I am going to treat with steroids and nebulizers in addition to the antibiotics ordered above for pneumonia. 3. Essential hypertension-plan to resume home medications. 4. Chronic diastolic heart failure- appears compensated and well managed.  Resume home medications. 5. Type 2 diabetes mellitus, insulin requiring-resume basal insulin, sliding scale coverage and prandial coverage ordered.  Monitor CBG closely. 6. Sleep apnea-patient reports that she does not tolerate CPAP. 7. Chronic anticoagulation-resume home apixaban. 8. Paroxysmal atrial fibrillation-anticoagulated with apixaban. 9. Hypothyroidism- stable, resume home levothyroxine.  DVT Prophylaxis: Apixaban Code Status: Full Family Communication:   Disposition Plan: Home  Time spent: 55 minutes  Standley Dakins, MD [facility] Pager [phone]  If 7PM-7AM, please contact night-coverage [email or web address] Password [facility] 08/05/2017, 11:42 AM

## 2017-08-06 DIAGNOSIS — F0390 Unspecified dementia without behavioral disturbance: Secondary | ICD-10-CM

## 2017-08-06 DIAGNOSIS — J441 Chronic obstructive pulmonary disease with (acute) exacerbation: Secondary | ICD-10-CM

## 2017-08-06 LAB — COMPREHENSIVE METABOLIC PANEL
ALT: 18 U/L (ref 14–54)
AST: 22 U/L (ref 15–41)
Albumin: 3.5 g/dL (ref 3.5–5.0)
Alkaline Phosphatase: 59 U/L (ref 38–126)
Anion gap: 13 (ref 5–15)
BILIRUBIN TOTAL: 0.6 mg/dL (ref 0.3–1.2)
BUN: 15 mg/dL (ref 6–20)
CO2: 29 mmol/L (ref 22–32)
Calcium: 8.8 mg/dL — ABNORMAL LOW (ref 8.9–10.3)
Chloride: 97 mmol/L — ABNORMAL LOW (ref 101–111)
Creatinine, Ser: 0.9 mg/dL (ref 0.44–1.00)
GFR calc Af Amer: >60 mL/min (ref >60)
Glucose, Bld: 270 mg/dL — ABNORMAL HIGH (ref 65–99)
POTASSIUM: 4.4 mmol/L (ref 3.5–5.1)
Sodium: 139 mmol/L (ref 135–145)
TOTAL PROTEIN: 7.1 g/dL (ref 6.5–8.1)

## 2017-08-06 LAB — GLUCOSE, CAPILLARY
GLUCOSE-CAPILLARY: 329 mg/dL — AB (ref 65–99)
Glucose-Capillary: 280 mg/dL — ABNORMAL HIGH (ref 65–99)
Glucose-Capillary: 270 mg/dL — ABNORMAL HIGH (ref 65–99)
Glucose-Capillary: 257 mg/dL — ABNORMAL HIGH (ref 65–99)

## 2017-08-06 LAB — MAGNESIUM: MAGNESIUM: 1.8 mg/dL (ref 1.7–2.4)

## 2017-08-06 LAB — HIV ANTIBODY (ROUTINE TESTING W REFLEX): HIV Screen 4th Generation wRfx: NONREACTIVE

## 2017-08-06 MED ORDER — ONDANSETRON HCL 4 MG/2ML IJ SOLN
4.0000 mg | Freq: Four times a day (QID) | INTRAMUSCULAR | Status: DC | PRN
Start: 2017-08-06 — End: 2017-08-22
  Administered 2017-08-06 – 2017-08-07 (×2): 4 mg via INTRAVENOUS
  Filled 2017-08-06 (×2): qty 2

## 2017-08-06 MED ORDER — ACETAMINOPHEN 325 MG PO TABS
650.0000 mg | ORAL_TABLET | Freq: Four times a day (QID) | ORAL | Status: DC | PRN
  Administered 2017-08-06 – 2017-08-13 (×3): 650 mg via ORAL
  Filled 2017-08-06 (×3): qty 2

## 2017-08-06 MED ORDER — INSULIN GLARGINE 100 UNIT/ML ~~LOC~~ SOLN
24.0000 [IU] | Freq: Every day | SUBCUTANEOUS | Status: DC
  Administered 2017-08-06: 24 [IU] via SUBCUTANEOUS
  Filled 2017-08-06 (×2): qty 0.24

## 2017-08-06 MED ORDER — INSULIN ASPART 100 UNIT/ML ~~LOC~~ SOLN
8.0000 [IU] | Freq: Three times a day (TID) | SUBCUTANEOUS | Status: DC
  Administered 2017-08-06 – 2017-08-07 (×3): 8 [IU] via SUBCUTANEOUS

## 2017-08-06 MED ORDER — TRAMADOL HCL 50 MG PO TABS: 50.0000 mg | ORAL_TABLET | Freq: Four times a day (QID) | ORAL | Status: DC | PRN

## 2017-08-06 MED ORDER — INSULIN ASPART 100 UNIT/ML ~~LOC~~ SOLN
6.0000 [IU] | Freq: Three times a day (TID) | SUBCUTANEOUS | Status: DC
  Administered 2017-08-06: 6 [IU] via SUBCUTANEOUS

## 2017-08-06 NOTE — Progress Notes (Signed)
PROGRESS NOTE    [name]  ZGY:[id number]  DOB: 06-16-1951  DOA: 08/05/2017 PCP: Gareth Morgan, MD   Brief Admission Hx: [name] is a 67 y.o. female with multiple medical comorbidities detailed below presented to the emergency department with persistent cough and shortness of breath for the past 2 weeks that failed outpatient management with 2 courses of oral amoxicillin.  Patient reports that she is on her second round of amoxicillin and continues to feel short of breath.  She has nonproductive cough.  She has some wheezing.  She has no chest pain.  The patient has no swelling in the extremities.  She has a congestive heart failure that is chronic and she is taking her diuretics regularly.  She denies palpitations.  She presented to the ED and noted to be tachypneic.  She was afebrile.  A chest x-ray was obtained that did show a large left retro-cardiac opacity that was suspicious for pneumonia.  EKG was unremarkable except for notation of prolonged PR interval.  Her troponin was 0.07.  She had a mildly elevated BNP at 189.  The patient's blood sugar was elevated at 236.  She was given treatment in the emergency department but remained tachypneic.  She is going to be admitted for community-acquired pneumonia that has failed outpatient treatment.  MDM/Assessment & Plan:    1. Community-acquired pneumonia that failed outpatient management-admitted to MedSurg bed, IV antibiotics with ceftriaxone and azithromycin orally has been ordered.  Oxygen support with supplemental O2.  Nebulizer treatments.  Cough syrup.  Continue supportive therapy. 2. Acute COPD Exacerbation -patient has diffuse wheezing and with her former smoking history I am concerned about COPD.  I am going to treat with steroids and nebulizers in addition to the antibiotics ordered above for pneumonia.  She is slightly improved today but remains tachypneic and speaking with short sentences.  Continue current  treatments. 3. Essential hypertension-plan to resume home medications. 4. Chronic diastolic heart failure- appears compensated and well managed.  Resume home medications. 5. Type 2 diabetes mellitus, insulin requiring- hyperglycemia from steroids exacerbating blood sugars, increase basal insulin, sliding scale coverage and prandial coverage.  Monitor CBG closely. 6. Sleep apnea-patient reports that she does not tolerate CPAP. 7. Chronic anticoagulation-resume home apixaban. 8. Paroxysmal atrial fibrillation-anticoagulated with apixaban. 9. Hypothyroidism- stable, resume home levothyroxine.  DVT Prophylaxis: Apixaban Code Status: Full Family Communication:   Disposition Plan: Home   Subjective: The patient is sitting up on the side of the bed, she reports that she does feel improved but continues to cough and wheeze.  She is speaking in short sentences.  Objective: Vitals:   08/06/17 0745 08/06/17 1025 08/06/17 1028 08/06/17 1315  BP:      Pulse:      Resp:      Temp:      TempSrc:      SpO2: 93% (!) 45% 91% 90%  Weight:      Height:        Intake/Output Summary (Last 24 hours) at 08/06/2017 1452 Last data filed at 08/05/2017 1754 Gross per 24 hour  Intake 240 ml  Output -  Net 240 ml   Filed Weights   08/05/17 0834 08/05/17 1140  Weight: 102.1 kg (225 lb) 110.8 kg (244 lb 4.3 oz)     REVIEW OF SYSTEMS  As per history otherwise all reviewed and reported negative  Exam:  General exam: Awake, alert, sitting up in the bed, speaking in short sentences and shallow breathing.  Respiratory system: Diffuse inspiratory/expiratory wheezes, shallow breathing. Cardiovascular system: S1 & S2 heard, tachycardic. No JVD, murmurs, gallops, clicks.  Mild pretibial edema bilateral lower extremities. Gastrointestinal system: Abdomen is nondistended, soft and nontender. Normal bowel sounds heard. Central nervous system: Alert and oriented. No focal neurological deficits. Extremities:  Mild pretibial edema bilateral lower extremities.  Data Reviewed: Basic Metabolic Panel: Recent Labs  Lab 08/05/17 0847 08/06/17 0646  NA 135 139  K 3.9 4.4  CL 95* 97*  CO2 28 29  GLUCOSE 236* 270*  BUN 15 15  CREATININE 1.11* 0.90  CALCIUM 8.4* 8.8*  MG  --  1.8   Liver Function Tests: Recent Labs  Lab 08/06/17 0646  AST 22  ALT 18  ALKPHOS 59  BILITOT 0.6  PROT 7.1  ALBUMIN 3.5   No results for input(s): LIPASE, AMYLASE in the last 168 hours. No results for input(s): AMMONIA in the last 168 hours. CBC: Recent Labs  Lab 08/05/17 0847  WBC 6.8  HGB 12.5  HCT 40.5  MCV 102.0*  PLT 174   Cardiac Enzymes: No results for input(s): CKTOTAL, CKMB, CKMBINDEX, TROPONINI in the last 168 hours. CBG (last 3)  Recent Labs    08/05/17 2144 08/06/17 0725 08/06/17 1121  GLUCAP 323* 280* 270*   Recent Results (from the past 240 hour(s))  Culture, blood (routine x 2) Call MD if unable to obtain prior to antibiotics being given     Status: None (Preliminary result)   Collection Time: 08/05/17 12:02 PM  Result Value Ref Range Status   Specimen Description LEFT ANTECUBITAL  Final   Special Requests   Final    BOTTLES DRAWN AEROBIC AND ANAEROBIC Blood Culture adequate volume   Culture   Final    NO GROWTH < 24 HOURS Performed at [facility], [address] [place], [place] [address]    Report Status PENDING  Incomplete  Respiratory Panel by PCR     Status: None   Collection Time: 08/05/17 12:37 PM  Result Value Ref Range Status   Adenovirus NOT DETECTED NOT DETECTED Final   Coronavirus 229E NOT DETECTED NOT DETECTED Final   Coronavirus HKU1 NOT DETECTED NOT DETECTED Final   Coronavirus NL63 NOT DETECTED NOT DETECTED Final   Coronavirus OC43 NOT DETECTED NOT DETECTED Final   Metapneumovirus NOT DETECTED NOT DETECTED Final   Rhinovirus / Enterovirus NOT DETECTED NOT DETECTED Final   Influenza A NOT DETECTED NOT DETECTED Final   Influenza B NOT DETECTED NOT  DETECTED Final   Parainfluenza Virus 1 NOT DETECTED NOT DETECTED Final   Parainfluenza Virus 2 NOT DETECTED NOT DETECTED Final   Parainfluenza Virus 3 NOT DETECTED NOT DETECTED Final   Parainfluenza Virus 4 NOT DETECTED NOT DETECTED Final   Respiratory Syncytial Virus NOT DETECTED NOT DETECTED Final   Bordetella pertussis NOT DETECTED NOT DETECTED Final   Chlamydophila pneumoniae NOT DETECTED NOT DETECTED Final   Mycoplasma pneumoniae NOT DETECTED NOT DETECTED Final    Comment: Performed at [facility], [address], [place], [place] [address]     Studies: Dg Chest 2 View  Result Date: 08/05/2017 CLINICAL DATA:  Shortness of breath.  Cough. EXAM: CHEST  2 VIEW COMPARISON:  September 30, 2016 FINDINGS: No pneumothorax. Stable cardiomegaly. The hila and mediastinum are unchanged. There appears to be increased opacity in the left retrocardiac region which could represent pneumonia given history. Recommend follow-up to resolution. IMPRESSION: Left retrocardiac opacity may represent pneumonia given history. Recommend follow-up to  resolution. Electronically Signed   By: Gerome Sam III M.D   On: 08/05/2017 09:43     Scheduled Meds: . amiodarone  200 mg Oral q morning - 10a  . apixaban  5 mg Oral BID  . azithromycin  500 mg Oral Q24H  . dextromethorphan  30 mg Oral BID  . DULoxetine  30 mg Oral BID  . famotidine  20 mg Oral QHS  . furosemide  40 mg Oral Daily  . gabapentin  400 mg Oral BID  . gabapentin  800 mg Oral QHS  . insulin aspart  0-15 Units Subcutaneous TID WC  . insulin aspart  8 Units Subcutaneous TID WC  . insulin glargine  24 Units Subcutaneous Q2200  . ipratropium-albuterol  3 mL Nebulization Q4H  . levETIRAcetam  1,000 mg Oral BID  . levothyroxine  137 mcg Oral QAC breakfast  . magnesium oxide  400 mg Oral Daily  . metaxalone  800 mg Oral TID  . methylPREDNISolone (SOLU-MEDROL) injection  40 mg Intravenous Q12H  . multivitamin with minerals  1 tablet Oral Daily   . pantoprazole  40 mg Oral BID AC  . potassium chloride  20 mEq Oral Daily   Continuous Infusions: . cefTRIAXone (ROCEPHIN)  IV Stopped (08/06/17 1130)    Principal Problem:   CAP Failed Outpatient Tx Active Problems:   Hypothyroidism   Hyperlipidemia   Essential hypertension   Atrial fibrillation (HCC)   Congestive heart failure (HCC)   Gastroparesis   Sleep apnea   Chronic anticoagulation   Tremor   Dementia   DM type 2 (diabetes mellitus, type 2) (HCC)   Pulmonary hypertension (HCC)   COPD exacerbation (HCC)   Time spent:   Standley Dakins, MD, FAAFP [facility] Pager [phone]  If 7PM-7AM, please contact night-coverage [email or web address] Password [email or web address] 08/06/2017, 2:52 PM    LOS: 1 day

## 2017-08-06 NOTE — Progress Notes (Signed)
Patient vomited after breathing treatment, No other problems noted

## 2017-08-06 NOTE — Progress Notes (Signed)
Patient had an SpO2 of 74% on 3lpm  [place] (while napping.) The patient is very slow to increase her oxygen saturation levels. Post nebulizer treatment and while wearing 3lpm she had an SpO2 of 91%.

## 2017-08-07 LAB — COMPREHENSIVE METABOLIC PANEL
ALBUMIN: 3.5 g/dL (ref 3.5–5.0)
ALK PHOS: 52 U/L (ref 38–126)
ALT: 19 U/L (ref 14–54)
ANION GAP: 13 (ref 5–15)
AST: 20 U/L (ref 15–41)
BILIRUBIN TOTAL: 0.5 mg/dL (ref 0.3–1.2)
BUN: 17 mg/dL (ref 6–20)
CALCIUM: 8.8 mg/dL — AB (ref 8.9–10.3)
CO2: 31 mmol/L (ref 22–32)
CREATININE: 0.9 mg/dL (ref 0.44–1.00)
Chloride: 93 mmol/L — ABNORMAL LOW (ref 101–111)
GFR calc Af Amer: >60 mL/min (ref >60)
GFR calc non Af Amer: >60 mL/min (ref >60)
GLUCOSE: 280 mg/dL — AB (ref 65–99)
Potassium: 4.6 mmol/L (ref 3.5–5.1)
SODIUM: 137 mmol/L (ref 135–145)
TOTAL PROTEIN: 7.1 g/dL (ref 6.5–8.1)

## 2017-08-07 LAB — BLOOD GAS, ARTERIAL
ACID-BASE EXCESS: 8.5 mmol/L — AB (ref 0.0–2.0)
Bicarbonate: 30.9 mmol/L — ABNORMAL HIGH (ref 20.0–28.0)
DRAWN BY: 277331
FIO2: 0.5
O2 CONTENT: 30 L/min
O2 Saturation: 89.1 %
PATIENT TEMPERATURE: 37
PCO2 ART: 59.6 mmHg — AB (ref 32.0–48.0)
pH, Arterial: 7.373 (ref 7.350–7.450)
pO2, Arterial: 59.2 mmHg — ABNORMAL LOW (ref 83.0–108.0)

## 2017-08-07 LAB — GLUCOSE, CAPILLARY
GLUCOSE-CAPILLARY: 263 mg/dL — AB (ref 65–99)
Glucose-Capillary: 283 mg/dL — ABNORMAL HIGH (ref 65–99)
Glucose-Capillary: 393 mg/dL — ABNORMAL HIGH (ref 65–99)

## 2017-08-07 LAB — MAGNESIUM: Magnesium: 1.9 mg/dL (ref 1.7–2.4)

## 2017-08-07 MED ORDER — KETOCONAZOLE 2 % EX CREA
TOPICAL_CREAM | Freq: Two times a day (BID) | CUTANEOUS | Status: DC
  Administered 2017-08-07 – 2017-08-09 (×4): via TOPICAL
  Administered 2017-08-09: 1 via TOPICAL
  Administered 2017-08-10 – 2017-08-11 (×3): via TOPICAL
  Administered 2017-08-11: 1 via TOPICAL
  Administered 2017-08-12 – 2017-08-19 (×13): via TOPICAL
  Administered 2017-08-19: 1 via TOPICAL
  Administered 2017-08-20 – 2017-08-21 (×4): via TOPICAL
  Administered 2017-08-22: 1 via TOPICAL
  Filled 2017-08-07 (×2): qty 15

## 2017-08-07 MED ORDER — HALOPERIDOL LACTATE 5 MG/ML IJ SOLN
5.0000 mg | Freq: Once | INTRAMUSCULAR | Status: AC
  Administered 2017-08-07: 5 mg via INTRAVENOUS
  Filled 2017-08-07: qty 1

## 2017-08-07 MED ORDER — INSULIN GLARGINE 100 UNIT/ML ~~LOC~~ SOLN
30.0000 [IU] | Freq: Every day | SUBCUTANEOUS | Status: DC
  Filled 2017-08-07: qty 0.3

## 2017-08-07 MED ORDER — NYSTATIN 100000 UNIT/GM EX POWD
Freq: Three times a day (TID) | CUTANEOUS | Status: DC
  Administered 2017-08-07 – 2017-08-18 (×35): via TOPICAL
  Administered 2017-08-19: 1 g via TOPICAL
  Administered 2017-08-19 – 2017-08-22 (×9): via TOPICAL
  Filled 2017-08-07 (×3): qty 15

## 2017-08-07 MED ORDER — DEXTROSE 50 % IV SOLN
INTRAVENOUS | Status: AC
  Administered 2017-08-07: 25 mL
  Filled 2017-08-07: qty 50

## 2017-08-07 MED ORDER — INSULIN GLARGINE 100 UNIT/ML ~~LOC~~ SOLN
40.0000 [IU] | Freq: Every day | SUBCUTANEOUS | Status: DC
  Filled 2017-08-07: qty 0.4

## 2017-08-07 MED ORDER — INSULIN ASPART 100 UNIT/ML ~~LOC~~ SOLN
12.0000 [IU] | Freq: Three times a day (TID) | SUBCUTANEOUS | Status: DC
  Administered 2017-08-07: 12 [IU] via SUBCUTANEOUS

## 2017-08-07 MED ORDER — ALBUTEROL SULFATE (2.5 MG/3ML) 0.083% IN NEBU
2.5000 mg | INHALATION_SOLUTION | RESPIRATORY_TRACT | Status: DC | PRN
  Administered 2017-08-21: 2.5 mg via RESPIRATORY_TRACT
  Filled 2017-08-07: qty 3

## 2017-08-07 MED ORDER — INSULIN ASPART 100 UNIT/ML ~~LOC~~ SOLN
10.0000 [IU] | Freq: Three times a day (TID) | SUBCUTANEOUS | Status: DC
  Administered 2017-08-07: 10 [IU] via SUBCUTANEOUS

## 2017-08-07 NOTE — Progress Notes (Signed)
Patientt oxygen increased to 7 lpm high flow cannula. Her saturation seems to be staying at about 93. Have tried to allow patient to sleep as much as possible between midnight and morning. Noted she is up in chair at 0540 this am. Overall patient sleeps only about 3 hours. She usually sleeps in chair. A CPAP or BiPAP would help her greatly, but states she cannot stand anything on her face. She does state she feels better: her wheezes sound better. Will continue to work with patient.

## 2017-08-07 NOTE — Progress Notes (Signed)
**Note De-Identified  Obfuscation** Patient placed on heated HFNC 30 Liters and 50% FIO2; tolerating well ath this time.   RRT to continue to monitor

## 2017-08-07 NOTE — Progress Notes (Signed)
Report called to Raynelle Fanning in ICU, patient and family aware of transfer.

## 2017-08-07 NOTE — Progress Notes (Addendum)
PROGRESS NOTE    [name]  ZOX:[id number]  DOB: [birth year]  DOA: 08/05/2017 PCP: Gareth Morgan, MD   Brief Admission Hx: [name] is a 67 y.o. female with multiple medical comorbidities detailed below presented to the emergency department with persistent cough and shortness of breath for the past 2 weeks that failed outpatient management with 2 courses of oral amoxicillin.  Patient reports that she is on her second round of amoxicillin and continues to feel short of breath.  She has nonproductive cough.  She has some wheezing.  She has no chest pain.  The patient has no swelling in the extremities.  She has a congestive heart failure that is chronic and she is taking her diuretics regularly.  She denies palpitations.  She presented to the ED and noted to be tachypneic.  She was afebrile.  A chest x-ray was obtained that did show a large left retro-cardiac opacity that was suspicious for pneumonia.  EKG was unremarkable except for notation of prolonged PR interval.  Her troponin was 0.07.  She had a mildly elevated BNP at 189.  The patient's blood sugar was elevated at 236.  She was given treatment in the emergency department but remained tachypneic.  She is going to be admitted for community-acquired pneumonia that has failed outpatient treatment.  MDM/Assessment & Plan:   1. Community-acquired pneumonia that failed outpatient management-admitted to MedSurg bed, IV antibiotics with ceftriaxone and azithromycin orally has been ordered.  Oxygen support with supplemental O2.  Nebulizer treatments.  Cough syrup.  Continue supportive therapy. 2. Acute respiratory failure with hypoxemia and hypercapnia / Acute COPD Exacerbation -patient has diffuse wheezing and with her former smoking history I am concerned about COPD.  I am going to treat with steroids and nebulizers in addition to the antibiotics ordered above for pneumonia.  She is slightly improved today.  Continue current treatments.  Pt  having higher oxygen requirements, will transfer to stepdown unit for closer monitoring.   3. Essential hypertension-plan to resume home medications. 4. Chronic diastolic heart failure- appears compensated and well managed.  Resume home medications. 5. Type 2 diabetes mellitus, insulin requiring- hyperglycemia from steroids exacerbating blood sugars, increase basal insulin, sliding scale coverage and prandial coverage.  Monitor CBG closely.  Increasing insulin doses today.  6. Sleep apnea-patient reports that she does not tolerate CPAP. 7. Chronic anticoagulation-resume home apixaban. 8. Paroxysmal atrial fibrillation-anticoagulated with apixaban. 9. Hypothyroidism- stable, resume home levothyroxine.  DVT Prophylaxis: Apixaban Code Status: Full Family Communication:   Disposition Plan: Home   Subjective: Pt in chair today, still wheezing a lot but less SOB and says she is starting to feel better.   Objective: Vitals:   08/06/17 2100 08/07/17 0044 08/07/17 0557 08/07/17 0751  BP: 138/88     Pulse: 87     Resp: 19     Temp: 98.4 F (36.9 C)     TempSrc: Oral     SpO2: 91% 93% 93% (!) 78%  Weight:      Height:        Intake/Output Summary (Last 24 hours) at 08/07/2017 8119 Last data filed at 08/07/2017 0500 Gross per 24 hour  Intake 720 ml  Output -  Net 720 ml   Filed Weights   08/05/17 0834 08/05/17 1140  Weight: 102.1 kg (225 lb) 110.8 kg (244 lb 4.3 oz)     REVIEW OF SYSTEMS  As per history otherwise all reviewed and reported negative  Exam:  General exam: Awake,  alert, sitting up in the bed, speaking in short sentences and shallow breathing. Respiratory system: Diffuse inspiratory/expiratory wheezes, shallow breathing. Cardiovascular system: S1 & S2 heard, tachycardic. No JVD, murmurs, gallops, clicks.  Mild pretibial edema bilateral lower extremities. Gastrointestinal system: Abdomen is nondistended, soft and nontender. Normal bowel sounds heard. Central nervous  system: Alert and oriented. No focal neurological deficits. Extremities: Mild pretibial edema bilateral lower extremities.  Data Reviewed: Basic Metabolic Panel: Recent Labs  Lab 08/05/17 0847 08/06/17 0646 08/07/17 0624  NA 135 139 137  K 3.9 4.4 4.6  CL 95* 97* 93*  CO2 28 29 31   GLUCOSE 236* 270* 280*  BUN 15 15 17   CREATININE 1.11* 0.90 0.90  CALCIUM 8.4* 8.8* 8.8*  MG  --  1.8 1.9   Liver Function Tests: Recent Labs  Lab 08/06/17 0646 08/07/17 0624  AST 22 20  ALT 18 19  ALKPHOS 59 52  BILITOT 0.6 0.5  PROT 7.1 7.1  ALBUMIN 3.5 3.5   No results for input(s): LIPASE, AMYLASE in the last 168 hours. No results for input(s): AMMONIA in the last 168 hours. CBC: Recent Labs  Lab 08/05/17 0847  WBC 6.8  HGB 12.5  HCT 40.5  MCV 102.0*  PLT 174   Cardiac Enzymes: No results for input(s): CKTOTAL, CKMB, CKMBINDEX, TROPONINI in the last 168 hours. CBG (last 3)  Recent Labs    08/06/17 1604 08/06/17 2108 08/07/17 0742  GLUCAP 257* 329* 283*   Recent Results (from the past 240 hour(s))  Culture, blood (routine x 2) Call MD if unable to obtain prior to antibiotics being given     Status: None (Preliminary result)   Collection Time: 08/05/17 12:02 PM  Result Value Ref Range Status   Specimen Description LEFT ANTECUBITAL  Final   Special Requests   Final    BOTTLES DRAWN AEROBIC AND ANAEROBIC Blood Culture adequate volume   Culture   Final    NO GROWTH 2 DAYS Performed at [facility], [address] [place], [place] [address]    Report Status PENDING  Incomplete  Respiratory Panel by PCR     Status: None   Collection Time: 08/05/17 12:37 PM  Result Value Ref Range Status   Adenovirus NOT DETECTED NOT DETECTED Final   Coronavirus 229E NOT DETECTED NOT DETECTED Final   Coronavirus HKU1 NOT DETECTED NOT DETECTED Final   Coronavirus NL63 NOT DETECTED NOT DETECTED Final   Coronavirus OC43 NOT DETECTED NOT DETECTED Final   Metapneumovirus NOT DETECTED NOT  DETECTED Final   Rhinovirus / Enterovirus NOT DETECTED NOT DETECTED Final   Influenza A NOT DETECTED NOT DETECTED Final   Influenza B NOT DETECTED NOT DETECTED Final   Parainfluenza Virus 1 NOT DETECTED NOT DETECTED Final   Parainfluenza Virus 2 NOT DETECTED NOT DETECTED Final   Parainfluenza Virus 3 NOT DETECTED NOT DETECTED Final   Parainfluenza Virus 4 NOT DETECTED NOT DETECTED Final   Respiratory Syncytial Virus NOT DETECTED NOT DETECTED Final   Bordetella pertussis NOT DETECTED NOT DETECTED Final   Chlamydophila pneumoniae NOT DETECTED NOT DETECTED Final   Mycoplasma pneumoniae NOT DETECTED NOT DETECTED Final    Comment: Performed at [facility], [address] [place], [place] [address]     Studies: Dg Chest 2 View  Result Date: 08/05/2017 CLINICAL DATA:  Shortness of breath.  Cough. EXAM: CHEST  2 VIEW COMPARISON:  September 30, 2016 FINDINGS: No pneumothorax. Stable cardiomegaly. The hila and mediastinum are unchanged. There appears  to be increased opacity in the left retrocardiac region which could represent pneumonia given history. Recommend follow-up to resolution. IMPRESSION: Left retrocardiac opacity may represent pneumonia given history. Recommend follow-up to resolution. Electronically Signed   By: Gerome Sam III M.D   On: 08/05/2017 09:43   Scheduled Meds: . amiodarone  200 mg Oral q morning - 10a  . apixaban  5 mg Oral BID  . azithromycin  500 mg Oral Q24H  . dextromethorphan  30 mg Oral BID  . DULoxetine  30 mg Oral BID  . famotidine  20 mg Oral QHS  . furosemide  40 mg Oral Daily  . gabapentin  400 mg Oral BID  . gabapentin  800 mg Oral QHS  . insulin aspart  0-15 Units Subcutaneous TID WC  . insulin aspart  10 Units Subcutaneous TID WC  . insulin glargine  30 Units Subcutaneous Q2200  . ipratropium-albuterol  3 mL Nebulization Q4H  . levETIRAcetam  1,000 mg Oral BID  . levothyroxine  137 mcg Oral QAC breakfast  . magnesium oxide  400 mg Oral Daily  .  metaxalone  800 mg Oral TID  . methylPREDNISolone (SOLU-MEDROL) injection  40 mg Intravenous Q12H  . multivitamin with minerals  1 tablet Oral Daily  . pantoprazole  40 mg Oral BID AC  . potassium chloride  20 mEq Oral Daily   Continuous Infusions: . cefTRIAXone (ROCEPHIN)  IV Stopped (08/06/17 1130)    Principal Problem:   CAP Failed Outpatient Tx Active Problems:   Hypothyroidism   Hyperlipidemia   Essential hypertension   Atrial fibrillation (HCC)   Congestive heart failure (HCC)   Gastroparesis   Sleep apnea   Chronic anticoagulation   Tremor   Dementia   DM type 2 (diabetes mellitus, type 2) (HCC)   Pulmonary hypertension (HCC)   COPD exacerbation (HCC)   Time spent:   Standley Dakins, MD, FAAFP [facility] Pager [phone]  If 7PM-7AM, please contact night-coverage [email or web address] Password [email or web address] 08/07/2017, 9:22 AM    LOS: 2 days

## 2017-08-07 NOTE — Progress Notes (Signed)
Inpatient Diabetes Program Recommendations  AACE/ADA: New Consensus Statement on Inpatient Glycemic Control (2015)  Target Ranges:  Prepandial:   less than 140 mg/dL      Peak postprandial:   less than 180 mg/dL (1-2 hours)      Critically ill patients:  140 - 180 mg/dL  Results for MONCERAT, RIVAS (MRN [id number]) as of 08/07/2017 07:53  Ref. Range 08/06/2017 07:25 08/06/2017 11:21 08/06/2017 16:04 08/06/2017 21:08 08/07/2017 07:42  Glucose-Capillary Latest Ref Range: 65 - 99 mg/dL 374 (H) 827 (H) 078 (H) 329 (H) 283 (H)    Review of Glycemic Control  Diabetes history: DM2 Outpatient Diabetes medications: Lantus 20 units QHS if glucose >178 mg/dl (or takes 50 units if glucose >300 mg/dl), Glipizide 10 mg BID, Metformin 1000 mg BID Current orders for Inpatient glycemic control: Lantus 24 units QHS, Novolog 8 units TID with meals, Novolog 0-15 units TID with meals; Solumedrol 40 mg Q12H  Inpatient Diabetes Program Recommendations:  Insulin - Basal: If steroids are continued as ordered, please consider increasing Lantus to 30 units QHS. Insulin-Correction: Please consider ordering Novolog 0-5 units QHS for bedtime correction. Insulin - Meal Coverage: If steroids are continued as ordered, please consider increasing meal coverage to Novolog 10 units TID with meals.  Thanks, Orlando Penner, RN, MSN, CDE Diabetes Coordinator Inpatient Diabetes Program [phone] (Team Pager from 8am to 5pm)

## 2017-08-07 NOTE — Progress Notes (Signed)
08/07/2017 3:50 PM  I transferred patient to ICU for worsening acute on chronic resp failure and starting her on bipap.   She has a intertriginal yeast rash in left pannus area, will Rx topical antifungals.    ABG was reviewed.   See orders  Maryln Manuel MD

## 2017-08-08 ENCOUNTER — Inpatient Hospital Stay (HOSPITAL_COMMUNITY): Payer: Medicare Other

## 2017-08-08 LAB — BLOOD GAS, ARTERIAL
Acid-Base Excess: 1.6 mmol/L (ref 0.0–2.0)
Acid-Base Excess: 2.6 mmol/L — ABNORMAL HIGH (ref 0.0–2.0)
Bicarbonate: 25.4 mmol/L (ref 20.0–28.0)
Bicarbonate: 25.6 mmol/L (ref 20.0–28.0)
DRAWN BY: 22223
Delivery systems: POSITIVE
Delivery systems: POSITIVE
Drawn by: 270161
Expiratory PAP: 7
FIO2: 60
FIO2: 70
Inspiratory PAP: 17
LHR: 10 {breaths}/min
Mode: POSITIVE
O2 SAT: 95.9 %
O2 Saturation: 88.8 %
PATIENT TEMPERATURE: 37
PCO2 ART: 58.4 mmHg — AB (ref 32.0–48.0)
PCO2 ART: 47.3 mmHg (ref 32.0–48.0)
PEEP: 5 cmH2O
PEEP: 7 cmH2O
PH ART: 7.366 (ref 7.350–7.450)
PO2 ART: 62.4 mmHg — AB (ref 83.0–108.0)
PO2 ART: 84.2 mmHg (ref 83.0–108.0)
Pressure support: 10 cmH2O
RATE: 10 resp/min
pH, Arterial: 7.307 — ABNORMAL LOW (ref 7.350–7.450)

## 2017-08-08 LAB — COMPREHENSIVE METABOLIC PANEL
ALT: 20 U/L (ref 14–54)
AST: 27 U/L (ref 15–41)
Albumin: 3.5 g/dL (ref 3.5–5.0)
Alkaline Phosphatase: 52 U/L (ref 38–126)
Anion gap: 15 (ref 5–15)
BUN: 19 mg/dL (ref 6–20)
CHLORIDE: 93 mmol/L — AB (ref 101–111)
CO2: 29 mmol/L (ref 22–32)
CREATININE: 0.84 mg/dL (ref 0.44–1.00)
Calcium: 8.8 mg/dL — ABNORMAL LOW (ref 8.9–10.3)
GFR calc Af Amer: >60 mL/min (ref >60)
Glucose, Bld: 253 mg/dL — ABNORMAL HIGH (ref 65–99)
Potassium: 4.2 mmol/L (ref 3.5–5.1)
Sodium: 137 mmol/L (ref 135–145)
Total Bilirubin: 1.2 mg/dL (ref 0.3–1.2)
Total Protein: 7.3 g/dL (ref 6.5–8.1)

## 2017-08-08 LAB — GLUCOSE, CAPILLARY
GLUCOSE-CAPILLARY: 60 mg/dL — AB (ref 65–99)
GLUCOSE-CAPILLARY: 151 mg/dL — AB (ref 65–99)
GLUCOSE-CAPILLARY: 404 mg/dL — AB (ref 65–99)
Glucose-Capillary: 116 mg/dL — ABNORMAL HIGH (ref 65–99)
Glucose-Capillary: 340 mg/dL — ABNORMAL HIGH (ref 65–99)
Glucose-Capillary: 380 mg/dL — ABNORMAL HIGH (ref 65–99)
Glucose-Capillary: 394 mg/dL — ABNORMAL HIGH (ref 65–99)

## 2017-08-08 LAB — MAGNESIUM: MAGNESIUM: 2 mg/dL (ref 1.7–2.4)

## 2017-08-08 MED ORDER — VANCOMYCIN HCL 10 G IV SOLR
1500.0000 mg | Freq: Once | INTRAVENOUS | Status: AC
  Administered 2017-08-08: 1500 mg via INTRAVENOUS
  Filled 2017-08-08: qty 1500

## 2017-08-08 MED ORDER — HALOPERIDOL LACTATE 5 MG/ML IJ SOLN
5.0000 mg | Freq: Four times a day (QID) | INTRAMUSCULAR | Status: DC | PRN
  Administered 2017-08-08: 5 mg via INTRAVENOUS
  Filled 2017-08-08: qty 1

## 2017-08-08 MED ORDER — HALOPERIDOL LACTATE 5 MG/ML IJ SOLN
5.0000 mg | Freq: Once | INTRAMUSCULAR | Status: AC
  Administered 2017-08-08: 5 mg via INTRAVENOUS
  Filled 2017-08-08: qty 1

## 2017-08-08 MED ORDER — VANCOMYCIN HCL IN DEXTROSE 1-5 GM/200ML-% IV SOLN
1000.0000 mg | Freq: Two times a day (BID) | INTRAVENOUS | Status: DC
  Administered 2017-08-09 – 2017-08-11 (×5): 1000 mg via INTRAVENOUS
  Filled 2017-08-08 (×4): qty 200

## 2017-08-08 MED ORDER — FUROSEMIDE 10 MG/ML IJ SOLN
40.0000 mg | Freq: Every day | INTRAMUSCULAR | Status: DC
  Administered 2017-08-08 – 2017-08-10 (×3): 40 mg via INTRAVENOUS
  Filled 2017-08-08 (×3): qty 4

## 2017-08-08 MED ORDER — INSULIN ASPART 100 UNIT/ML ~~LOC~~ SOLN
10.0000 [IU] | Freq: Three times a day (TID) | SUBCUTANEOUS | Status: DC
  Administered 2017-08-08: 10 [IU] via SUBCUTANEOUS

## 2017-08-08 MED ORDER — INSULIN ASPART 100 UNIT/ML ~~LOC~~ SOLN
0.0000 [IU] | SUBCUTANEOUS | Status: DC | PRN
  Administered 2017-08-08: 15 [IU] via SUBCUTANEOUS
  Filled 2017-08-08: qty 0.15

## 2017-08-08 MED ORDER — LORAZEPAM 2 MG/ML IJ SOLN
1.0000 mg | INTRAMUSCULAR | Status: DC | PRN
  Administered 2017-08-08 – 2017-08-09 (×5): 1 mg via INTRAVENOUS
  Filled 2017-08-08 (×5): qty 1

## 2017-08-08 MED ORDER — DEXTROSE 5 % IV SOLN
1.0000 g | Freq: Three times a day (TID) | INTRAVENOUS | Status: DC
  Administered 2017-08-08 – 2017-08-14 (×18): 1 g via INTRAVENOUS
  Filled 2017-08-08 (×21): qty 1

## 2017-08-08 MED ORDER — LORAZEPAM 2 MG/ML IJ SOLN
1.0000 mg | Freq: Once | INTRAMUSCULAR | Status: AC
  Administered 2017-08-08: 1 mg via INTRAVENOUS
  Filled 2017-08-08: qty 1

## 2017-08-08 MED ORDER — INSULIN GLARGINE 100 UNIT/ML ~~LOC~~ SOLN
30.0000 [IU] | Freq: Every day | SUBCUTANEOUS | Status: DC
  Administered 2017-08-09: 30 [IU] via SUBCUTANEOUS
  Filled 2017-08-08 (×2): qty 0.3

## 2017-08-08 MED ORDER — PANTOPRAZOLE SODIUM 40 MG IV SOLR
40.0000 mg | INTRAVENOUS | Status: DC
  Administered 2017-08-08 – 2017-08-21 (×14): 40 mg via INTRAVENOUS
  Filled 2017-08-08 (×14): qty 40

## 2017-08-08 MED ORDER — HALOPERIDOL LACTATE 5 MG/ML IJ SOLN
5.0000 mg | INTRAMUSCULAR | Status: DC | PRN
  Administered 2017-08-08 (×3): 5 mg via INTRAVENOUS
  Filled 2017-08-08 (×4): qty 1

## 2017-08-08 MED ORDER — LORAZEPAM 2 MG/ML IJ SOLN
1.0000 mg | Freq: Three times a day (TID) | INTRAMUSCULAR | Status: DC | PRN
  Administered 2017-08-08: 1 mg via INTRAVENOUS
  Filled 2017-08-08: qty 1

## 2017-08-08 NOTE — Progress Notes (Addendum)
PROGRESS NOTE    [name]  MVH:[id number]  DOB: 04-18-1951  DOA: 08/05/2017 PCP: Gareth Morgan, MD   Brief Admission Hx: [name] is a 67 y.o. female with multiple medical comorbidities detailed below presented to the emergency department with persistent cough and shortness of breath for the past 2 weeks that failed outpatient management with 2 courses of oral amoxicillin.  Patient reports that she is on her second round of amoxicillin and continues to feel short of breath.  She has nonproductive cough.  She has some wheezing.  She has no chest pain.  The patient has no swelling in the extremities.  She has a congestive heart failure that is chronic and she is taking her diuretics regularly.  She denies palpitations.  She presented to the ED and noted to be tachypneic.  She was afebrile.  A chest x-ray was obtained that did show a large left retro-cardiac opacity that was suspicious for pneumonia.  EKG was unremarkable except for notation of prolonged PR interval.  Her troponin was 0.07.  She had a mildly elevated BNP at 189.  The patient's blood sugar was elevated at 236.  She was given treatment in the emergency department but remained tachypneic.  She is going to be admitted for community-acquired pneumonia that has failed outpatient treatment.  MDM/Assessment & Plan:   1. Community-acquired pneumonia that failed outpatient management-admitted to MedSurg bed, IV antibiotics with ceftriaxone and azithromycin orally has been ordered.  She is not improving, will escalate antibiotics to cefepime/vancomycin Oxygen support with supplemental O2.  Nebulizer treatments.  Cough syrup.  Continue supportive therapy. 2. Acute respiratory failure with hypoxemia and hypercapnia / Acute COPD Exacerbation -patient has diffuse wheezing and with her former smoking history I am concerned about COPD.  I continue  steroids and nebulizers in addition to the antibiotics ordered above for pneumonia.  Continue  bipap, if no improvement I discussed with family at bedside that we may have to intubate and they verbalized understanding but wanted to reserve intubation as the very last resort.  Pulmonary consult requested. Increasing sedation to see if she can tolerate bipap longer.   3. Essential hypertension-plan to resume home medications. 4. Chronic diastolic heart failure- IV lasix ordered.  Follow output. Foley placed.   5. Type 2 diabetes mellitus, insulin requiring- hyperglycemia from steroids exacerbating blood sugars, basal insulin, sliding scale coverage and prandial coverage.  Monitor CBG closely.  Increasing insulin doses today.  6. Sleep apnea-patient reports that she does not tolerate CPAP. 7. Chronic anticoagulation-resume home apixaban. 8. Paroxysmal atrial fibrillation-anticoagulated with apixaban. 9. Hypothyroidism- stable, resume home levothyroxine.  DVT Prophylaxis: Apixaban Code Status: Full Family Communication:   Disposition Plan: Home  Subjective: Pt lethargic, not tolerating bipap well this morning   Objective: Vitals:   08/08/17 1230 08/08/17 1245 08/08/17 1300 08/08/17 1400  BP:   (!) 132/108   Pulse:  95 89 76  Resp:   (!) 25 (!) 22  Temp: 98.8 F (37.1 C)     TempSrc:      SpO2: 92% 90% (!) 89% 92%  Weight:      Height:       No intake or output data in the 24 hours ending 08/08/17 1521 Filed Weights   08/05/17 0834 08/05/17 1140 08/08/17 0500  Weight: 102.1 kg (225 lb) 110.8 kg (244 lb 4.3 oz) 106.6 kg (235 lb 0.2 oz)   REVIEW OF SYSTEMS  As per history otherwise all reviewed and reported negative  Exam:  General exam: lethargic, agitated in bed. On bipap.  Respiratory system: Diffuse inspiratory/expiratory wheezes, shallow breathing. Cardiovascular system: S1 & S2 heard, tachycardic. No JVD, murmurs, gallops, clicks.  Mild pretibial edema bilateral lower extremities. Gastrointestinal system: Abdomen is nondistended, soft and nontender. Normal bowel  sounds heard. Central nervous system: Alert and oriented. No focal neurological deficits. Extremities: Mild pretibial edema bilateral lower extremities.  Data Reviewed: Basic Metabolic Panel: Recent Labs  Lab 08/05/17 0847 08/06/17 0646 08/07/17 0624 08/08/17 0427  NA 135 139 137 137  K 3.9 4.4 4.6 4.2  CL 95* 97* 93* 93*  CO2 28 29 31 29   GLUCOSE 236* 270* 280* 253*  BUN 15 15 17 19   CREATININE 1.11* 0.90 0.90 0.84  CALCIUM 8.4* 8.8* 8.8* 8.8*  MG  --  1.8 1.9 2.0   Liver Function Tests: Recent Labs  Lab 08/06/17 0646 08/07/17 0624 08/08/17 0427  AST 22 20 27   ALT 18 19 20   ALKPHOS 59 52 52  BILITOT 0.6 0.5 1.2  PROT 7.1 7.1 7.3  ALBUMIN 3.5 3.5 3.5   No results for input(s): LIPASE, AMYLASE in the last 168 hours. No results for input(s): AMMONIA in the last 168 hours. CBC: Recent Labs  Lab 08/05/17 0847  WBC 6.8  HGB 12.5  HCT 40.5  MCV 102.0*  PLT 174   Cardiac Enzymes: No results for input(s): CKTOTAL, CKMB, CKMBINDEX, TROPONINI in the last 168 hours. CBG (last 3)  Recent Labs    08/07/17 2120 08/07/17 2138 08/08/17 0814  GLUCAP 60* 116* 340*   Recent Results (from the past 240 hour(s))  Culture, blood (routine x 2) Call MD if unable to obtain prior to antibiotics being given     Status: None (Preliminary result)   Collection Time: 08/05/17 12:02 PM  Result Value Ref Range Status   Specimen Description LEFT ANTECUBITAL  Final   Special Requests   Final    BOTTLES DRAWN AEROBIC AND ANAEROBIC Blood Culture adequate volume   Culture   Final    NO GROWTH 3 DAYS Performed at [facility], [address], [place], [place] [address]    Report Status PENDING  Incomplete  Respiratory Panel by PCR     Status: None   Collection Time: 08/05/17 12:37 PM  Result Value Ref Range Status   Adenovirus NOT DETECTED NOT DETECTED Final   Coronavirus 229E NOT DETECTED NOT DETECTED Final   Coronavirus HKU1 NOT DETECTED NOT DETECTED Final   Coronavirus NL63 NOT  DETECTED NOT DETECTED Final   Coronavirus OC43 NOT DETECTED NOT DETECTED Final   Metapneumovirus NOT DETECTED NOT DETECTED Final   Rhinovirus / Enterovirus NOT DETECTED NOT DETECTED Final   Influenza A NOT DETECTED NOT DETECTED Final   Influenza B NOT DETECTED NOT DETECTED Final   Parainfluenza Virus 1 NOT DETECTED NOT DETECTED Final   Parainfluenza Virus 2 NOT DETECTED NOT DETECTED Final   Parainfluenza Virus 3 NOT DETECTED NOT DETECTED Final   Parainfluenza Virus 4 NOT DETECTED NOT DETECTED Final   Respiratory Syncytial Virus NOT DETECTED NOT DETECTED Final   Bordetella pertussis NOT DETECTED NOT DETECTED Final   Chlamydophila pneumoniae NOT DETECTED NOT DETECTED Final   Mycoplasma pneumoniae NOT DETECTED NOT DETECTED Final    Comment: Performed at [facility], [address], [place], [place] [address]     Studies: Dg Chest Port 1 View  Result Date: 08/08/2017 CLINICAL DATA:  Respiratory failure. EXAM: PORTABLE CHEST 1 VIEW COMPARISON:  August 05, 2017 FINDINGS: There has been progression of airspace consolidation on the left. There is now airspace consolidation throughout most of the left lung. There is a small left pleural effusion. The right lung is clear. There is cardiomegaly with pulmonary venous hypertension. There is aortic atherosclerosis. No adenopathy. No evident bone lesions. IMPRESSION: Consolidation throughout most of the left lung felt to represent widespread pneumonia, progressed from recent study. Right lung clear. Underlying pulmonary vascular congestion. There is aortic atherosclerosis. Aortic Atherosclerosis (ICD10-I70.0). Electronically Signed   By: Bretta Bang III M.D.   On: 08/08/2017 09:27   Scheduled Meds: . amiodarone  200 mg Oral q morning - 10a  . apixaban  5 mg Oral BID  . azithromycin  500 mg Oral Q24H  . dextromethorphan  30 mg Oral BID  . DULoxetine  30 mg Oral BID  . famotidine  20 mg Oral QHS  . furosemide  40 mg Intravenous Daily  .  gabapentin  400 mg Oral BID  . gabapentin  800 mg Oral QHS  . insulin aspart  0-15 Units Subcutaneous TID WC  . insulin aspart  10 Units Subcutaneous TID WC  . insulin glargine  30 Units Subcutaneous Q2200  . ipratropium-albuterol  3 mL Nebulization Q4H  . ketoconazole   Topical BID  . levETIRAcetam  1,000 mg Oral BID  . levothyroxine  137 mcg Oral QAC breakfast  . magnesium oxide  400 mg Oral Daily  . metaxalone  800 mg Oral TID  . methylPREDNISolone (SOLU-MEDROL) injection  40 mg Intravenous Q12H  . multivitamin with minerals  1 tablet Oral Daily  . nystatin   Topical TID  . pantoprazole  40 mg Oral BID AC  . potassium chloride  20 mEq Oral Daily   Continuous Infusions: . cefTRIAXone (ROCEPHIN)  IV Stopped (08/07/17 1046)    Principal Problem:   CAP Failed Outpatient Tx Active Problems:   Hypothyroidism   Hyperlipidemia   Essential hypertension   Atrial fibrillation (HCC)   Congestive heart failure (HCC)   Gastroparesis   Sleep apnea   Chronic anticoagulation   Tremor   Dementia   DM type 2 (diabetes mellitus, type 2) (HCC)   Pulmonary hypertension (HCC)   COPD exacerbation (HCC)   Critical Care Time spent: 40 mins  Standley Dakins, MD, FAAFP [facility] Pager [phone]  If 7PM-7AM, please contact night-coverage [email or web address] Password [email or web address] 08/08/2017, 3:21 PM    LOS: 3 days

## 2017-08-08 NOTE — Progress Notes (Signed)
Inpatient Diabetes Program Recommendations  AACE/ADA: New Consensus Statement on Inpatient Glycemic Control (2015)  Target Ranges:  Prepandial:   less than 140 mg/dL      Peak postprandial:   less than 180 mg/dL (1-2 hours)      Critically ill patients:  140 - 180 mg/dL  Results for CHAYNA, PICK (MRN [id number]) as of 08/08/2017 08:17  Ref. Range 08/08/2017 04:27  Glucose Latest Ref Range: 65 - 99 mg/dL 409 (H)   Results for GEARLENE, DEASY (MRN [id number]) as of 08/08/2017 08:17  Ref. Range 08/07/2017 07:42 08/07/2017 11:31 08/07/2017 16:10 08/07/2017 21:20 08/07/2017 21:38  Glucose-Capillary Latest Ref Range: 65 - 99 mg/dL 956 (H) 213 (H) 086 (H) 60 (L) 116 (H)   Review of Glycemic Control  Diabetes history: DM2 Outpatient Diabetes medications: Lantus 20 units QHS if glucose >178 mg/dl (or takes 50 units if glucose >300 mg/dl), Glipizide 10 mg BID, Metformin 1000 mg BID Current orders for Inpatient glycemic control: Lantus 30 units QHS, Novolog 10 units TID with meals, Novolog 0-15 units TID with meals; Solumedrol 40 mg Q12H  Inpatient Diabetes Program Recommendations:  Insulin - Basal: Lantus was NOT given last night (charted as Held due to glucose of 60 mg/dl at 57:84). Noted Lantus was decreased this morning to 30 units QHS. Insulin-Correction: Please consider ordering Novolog 0-5 units QHS for bedtime correction. Insulin - Meal Coverage: Noted meal coverage was decreased this morning to Novolog 10 units TID with meals.  Thanks, Orlando Penner, RN, MSN, CDE Diabetes Coordinator Inpatient Diabetes Program [phone] (Team Pager from 8am to 5pm)

## 2017-08-08 NOTE — Progress Notes (Signed)
Pharmacy Antibiotic Note  [name] is a 67 y.o. female admitted on 08/05/2017 with pneumonia.  Pharmacy has been consulted for Vancomycin dosing.  Plan: Vancomycin 1500mg  x 1 then 1000mg  IV q12hrs Monitor labs, progress, c/s  Height: 5\' 8"  (172.7 cm) Weight: 235 lb 0.2 oz (106.6 kg) IBW/kg (Calculated) : 63.9  Temp (24hrs), Avg:98.2 F (36.8 C), Min:97.6 F (36.4 C), Max:98.8 F (37.1 C)  Recent Labs  Lab 08/05/17 0847 08/06/17 0646 08/07/17 0624 08/08/17 0427  WBC 6.8  --   --   --   CREATININE 1.11* 0.90 0.90 0.84    Estimated Creatinine Clearance: 82 mL/min (by C-G formula based on SCr of 0.84 mg/dL).    Allergies  Allergen Reactions  . Ibuprofen Other (See Comments)    Upsets her stomach and causes abdominal pain  . Codeine Nausea And Vomiting and Other (See Comments)    HALLUCINATIONS  . Dilaudid [Hydromorphone Hcl] Other (See Comments)    Made her pass out  . Celexa [Citalopram Hydrobromide] Other (See Comments)    Dyskinesia  . Reglan [Metoclopramide] Other (See Comments)    DYSKINESIA   Antimicrobials this admission: Vancomycin 2/5 >>  Cefepime 2/5 >>  Rocephin 2/3>2/5  Dose adjustments this admission:  Microbiology results:  BCx: pending  UCx:    Sputum:    MRSA PCR: pending  Thank you for allowing pharmacy to be a part of this patient's care.  Valrie Hart A 08/08/2017 4:30 PM

## 2017-08-09 ENCOUNTER — Inpatient Hospital Stay (HOSPITAL_COMMUNITY): Payer: Medicare Other | Admitting: Anesthesiology

## 2017-08-09 ENCOUNTER — Inpatient Hospital Stay (HOSPITAL_COMMUNITY): Payer: Medicare Other

## 2017-08-09 DIAGNOSIS — Z794 Long term (current) use of insulin: Secondary | ICD-10-CM

## 2017-08-09 DIAGNOSIS — E119 Type 2 diabetes mellitus without complications: Secondary | ICD-10-CM

## 2017-08-09 DIAGNOSIS — J9600 Acute respiratory failure, unspecified whether with hypoxia or hypercapnia: Secondary | ICD-10-CM

## 2017-08-09 DIAGNOSIS — J9602 Acute respiratory failure with hypercapnia: Secondary | ICD-10-CM

## 2017-08-09 LAB — BLOOD GAS, ARTERIAL
ACID-BASE EXCESS: 10.3 mmol/L — AB (ref 0.0–2.0)
BICARBONATE: 33.6 mmol/L — AB (ref 20.0–28.0)
Drawn by: 277331
FIO2: 0.7
LHR: 20 {breaths}/min
O2 SAT: 92.1 %
PATIENT TEMPERATURE: 37
PCO2 ART: 44.3 mmHg (ref 32.0–48.0)
PEEP/CPAP: 5 cmH2O
PH ART: 7.499 — AB (ref 7.350–7.450)
PO2 ART: 59.7 mmHg — AB (ref 83.0–108.0)
VT: 520 mL

## 2017-08-09 LAB — COMPREHENSIVE METABOLIC PANEL
ALK PHOS: 50 U/L (ref 38–126)
ALT: 21 U/L (ref 14–54)
ANION GAP: 14 (ref 5–15)
AST: 38 U/L (ref 15–41)
Albumin: 3 g/dL — ABNORMAL LOW (ref 3.5–5.0)
BUN: 21 mg/dL — ABNORMAL HIGH (ref 6–20)
CALCIUM: 8.9 mg/dL (ref 8.9–10.3)
CHLORIDE: 95 mmol/L — AB (ref 101–111)
CO2: 31 mmol/L (ref 22–32)
CREATININE: 0.95 mg/dL (ref 0.44–1.00)
Glucose, Bld: 282 mg/dL — ABNORMAL HIGH (ref 65–99)
Potassium: 3.6 mmol/L (ref 3.5–5.1)
Sodium: 140 mmol/L (ref 135–145)
Total Bilirubin: 1 mg/dL (ref 0.3–1.2)
Total Protein: 6.8 g/dL (ref 6.5–8.1)

## 2017-08-09 LAB — GLUCOSE, CAPILLARY
GLUCOSE-CAPILLARY: 268 mg/dL — AB (ref 65–99)
GLUCOSE-CAPILLARY: 422 mg/dL — AB (ref 65–99)
Glucose-Capillary: 277 mg/dL — ABNORMAL HIGH (ref 65–99)
Glucose-Capillary: 303 mg/dL — ABNORMAL HIGH (ref 65–99)
Glucose-Capillary: 462 mg/dL — ABNORMAL HIGH (ref 65–99)

## 2017-08-09 LAB — CBC
HCT: 37.2 % (ref 36.0–46.0)
Hemoglobin: 11.4 g/dL — ABNORMAL LOW (ref 12.0–15.0)
MCH: 31.7 pg (ref 26.0–34.0)
MCHC: 30.6 g/dL (ref 30.0–36.0)
MCV: 103.3 fL — AB (ref 78.0–100.0)
PLATELETS: 158 10*3/uL (ref 150–400)
RBC: 3.6 MIL/uL — AB (ref 3.87–5.11)
RDW: 15 % (ref 11.5–15.5)
WBC: 6.4 10*3/uL (ref 4.0–10.5)

## 2017-08-09 LAB — TRIGLYCERIDES: Triglycerides: 131 mg/dL (ref <150)

## 2017-08-09 MED ORDER — ETOMIDATE 2 MG/ML IV SOLN
INTRAVENOUS | Status: DC | PRN
  Administered 2017-08-09: 10 mg via INTRAVENOUS

## 2017-08-09 MED ORDER — FENTANYL CITRATE (PF) 100 MCG/2ML IJ SOLN
50.0000 ug | Freq: Once | INTRAMUSCULAR | Status: AC
  Administered 2017-08-09: 50 ug via INTRAVENOUS
  Filled 2017-08-09: qty 2

## 2017-08-09 MED ORDER — PROPOFOL 1000 MG/100ML IV EMUL
0.0000 ug/kg/min | INTRAVENOUS | Status: DC
  Administered 2017-08-09: 35 ug/kg/min via INTRAVENOUS
  Administered 2017-08-09: 5 ug/kg/min via INTRAVENOUS
  Administered 2017-08-09: 35 ug/kg/min via INTRAVENOUS
  Administered 2017-08-09: 40 ug/kg/min via INTRAVENOUS
  Filled 2017-08-09: qty 100

## 2017-08-09 MED ORDER — SUCCINYLCHOLINE CHLORIDE 20 MG/ML IJ SOLN
INTRAMUSCULAR | Status: DC | PRN
  Administered 2017-08-09: 80 mg via INTRAVENOUS

## 2017-08-09 MED ORDER — MIDAZOLAM 50MG/50ML (1MG/ML) PREMIX INFUSION
INTRAVENOUS | Status: AC
  Filled 2017-08-09: qty 50

## 2017-08-09 MED ORDER — MIDAZOLAM HCL 2 MG/2ML IJ SOLN
2.0000 mg | INTRAMUSCULAR | Status: DC | PRN
  Administered 2017-08-09 – 2017-08-21 (×10): 2 mg via INTRAVENOUS
  Filled 2017-08-09 (×12): qty 2

## 2017-08-09 MED ORDER — FENTANYL BOLUS VIA INFUSION
25.0000 ug | INTRAVENOUS | Status: DC | PRN
  Administered 2017-08-09 – 2017-08-16 (×12): 25 ug via INTRAVENOUS
  Filled 2017-08-09: qty 25

## 2017-08-09 MED ORDER — INSULIN ASPART 100 UNIT/ML ~~LOC~~ SOLN
10.0000 [IU] | Freq: Once | SUBCUTANEOUS | Status: AC
  Administered 2017-08-09: 10 [IU] via SUBCUTANEOUS

## 2017-08-09 MED ORDER — ORAL CARE MOUTH RINSE
15.0000 mL | Freq: Four times a day (QID) | OROMUCOSAL | Status: DC
  Administered 2017-08-09 – 2017-08-22 (×50): 15 mL via OROMUCOSAL

## 2017-08-09 MED ORDER — FENTANYL CITRATE (PF) 100 MCG/2ML IJ SOLN
50.0000 ug | INTRAMUSCULAR | Status: DC | PRN
  Administered 2017-08-09 – 2017-08-22 (×5): 50 ug via INTRAVENOUS
  Filled 2017-08-09 (×4): qty 2

## 2017-08-09 MED ORDER — IPRATROPIUM-ALBUTEROL 0.5-2.5 (3) MG/3ML IN SOLN
3.0000 mL | RESPIRATORY_TRACT | Status: DC
  Administered 2017-08-09 – 2017-08-18 (×55): 3 mL via RESPIRATORY_TRACT
  Filled 2017-08-09 (×55): qty 3

## 2017-08-09 MED ORDER — INSULIN GLARGINE 100 UNIT/ML ~~LOC~~ SOLN
35.0000 [IU] | Freq: Every day | SUBCUTANEOUS | Status: DC
  Administered 2017-08-09: 35 [IU] via SUBCUTANEOUS
  Filled 2017-08-09: qty 0.35

## 2017-08-09 MED ORDER — PRO-STAT SUGAR FREE PO LIQD
45.0000 mL | Freq: Four times a day (QID) | ORAL | Status: DC
Start: 2017-08-09 — End: 2017-08-10
  Administered 2017-08-09 – 2017-08-10 (×6): 45 mL
  Filled 2017-08-09 (×4): qty 60

## 2017-08-09 MED ORDER — PANTOPRAZOLE SODIUM 40 MG IV SOLR: 40.0000 mg | Freq: Every day | INTRAVENOUS | Status: DC

## 2017-08-09 MED ORDER — VITAL HIGH PROTEIN PO LIQD
1000.0000 mL | ORAL | Status: DC
  Administered 2017-08-09: 1000 mL
  Filled 2017-08-09 (×2): qty 1000

## 2017-08-09 MED ORDER — SODIUM CHLORIDE 0.9 % IV SOLN
6.0000 mg/h | INTRAVENOUS | Status: DC
  Administered 2017-08-09 – 2017-08-12 (×10): 4 mg/h via INTRAVENOUS
  Administered 2017-08-13: 5 mg/h via INTRAVENOUS
  Administered 2017-08-13: 4 mg/h via INTRAVENOUS
  Administered 2017-08-14 – 2017-08-21 (×16): 6 mg/h via INTRAVENOUS
  Filled 2017-08-09 (×23): qty 10

## 2017-08-09 MED ORDER — CHLORHEXIDINE GLUCONATE 0.12% ORAL RINSE (MEDLINE KIT)
15.0000 mL | Freq: Two times a day (BID) | OROMUCOSAL | Status: DC
  Administered 2017-08-09 – 2017-08-22 (×27): 15 mL via OROMUCOSAL

## 2017-08-09 MED ORDER — FENTANYL CITRATE (PF) 100 MCG/2ML IJ SOLN
50.0000 ug | INTRAMUSCULAR | Status: AC | PRN
  Administered 2017-08-09 (×3): 50 ug via INTRAVENOUS
  Filled 2017-08-09 (×3): qty 2

## 2017-08-09 MED ORDER — FENTANYL CITRATE (PF) 2500 MCG/50ML IJ SOLN
25.0000 ug/h | INTRAMUSCULAR | Status: DC
  Administered 2017-08-09 (×2): 50 ug/h via INTRAVENOUS
  Administered 2017-08-09: 75 ug/h via INTRAVENOUS
  Administered 2017-08-09 (×2): 50 ug/h via INTRAVENOUS
  Administered 2017-08-09: 100 ug/h via INTRAVENOUS
  Administered 2017-08-09: 125 ug/h via INTRAVENOUS
  Administered 2017-08-10 (×2): 175 ug/h via INTRAVENOUS
  Administered 2017-08-11: 200 ug/h via INTRAVENOUS
  Administered 2017-08-11 (×3): 50 ug/h via INTRAVENOUS
  Administered 2017-08-11 (×2): 200 ug/h via INTRAVENOUS
  Administered 2017-08-12: 300 ug/h via INTRAVENOUS
  Administered 2017-08-12 – 2017-08-13 (×3): 400 ug/h via INTRAVENOUS
  Administered 2017-08-14 (×2): 300 ug/h via INTRAVENOUS
  Administered 2017-08-15 (×2): 100 ug/h via INTRAVENOUS
  Administered 2017-08-15 (×2): 400 ug/h via INTRAVENOUS
  Administered 2017-08-15: 50 ug/h via INTRAVENOUS
  Administered 2017-08-15: 325 ug/h via INTRAVENOUS
  Administered 2017-08-16: 250 ug/h via INTRAVENOUS
  Administered 2017-08-16: 100 ug/h via INTRAVENOUS
  Administered 2017-08-17: 350 ug/h via INTRAVENOUS
  Administered 2017-08-17 (×2): 275 ug/h via INTRAVENOUS
  Administered 2017-08-18: 200 ug/h via INTRAVENOUS
  Administered 2017-08-18: 175 ug/h via INTRAVENOUS
  Administered 2017-08-19: 275 ug/h via INTRAVENOUS
  Administered 2017-08-19: 300 ug/h via INTRAVENOUS
  Administered 2017-08-19: 325 ug/h via INTRAVENOUS
  Administered 2017-08-20: 275 ug/h via INTRAVENOUS
  Administered 2017-08-20: 300 ug/h via INTRAVENOUS
  Administered 2017-08-20: 325 ug/h via INTRAVENOUS
  Administered 2017-08-20: 27.5 ug/h via INTRAVENOUS
  Administered 2017-08-21: 50 ug/h via INTRAVENOUS
  Administered 2017-08-21: 325 ug/h via INTRAVENOUS
  Administered 2017-08-21 (×4): 50 ug/h via INTRAVENOUS
  Administered 2017-08-22 (×2): 300 ug/h via INTRAVENOUS
  Filled 2017-08-09 (×27): qty 50

## 2017-08-09 MED ORDER — ACETYLCYSTEINE 20 % IN SOLN
3.0000 mL | RESPIRATORY_TRACT | Status: DC
  Administered 2017-08-09 – 2017-08-10 (×10): 3 mL via RESPIRATORY_TRACT
  Administered 2017-08-11 (×2): 4 mL via RESPIRATORY_TRACT
  Administered 2017-08-11: 3 mL via RESPIRATORY_TRACT
  Administered 2017-08-11: 4 mL via RESPIRATORY_TRACT
  Administered 2017-08-11 – 2017-08-12 (×5): 3 mL via RESPIRATORY_TRACT
  Administered 2017-08-12: 4 mL via RESPIRATORY_TRACT
  Administered 2017-08-12 – 2017-08-13 (×4): 3 mL via RESPIRATORY_TRACT
  Administered 2017-08-13: 4 mL via RESPIRATORY_TRACT
  Administered 2017-08-13 – 2017-08-14 (×7): 3 mL via RESPIRATORY_TRACT
  Administered 2017-08-14: via RESPIRATORY_TRACT
  Administered 2017-08-14 – 2017-08-15 (×3): 3 mL via RESPIRATORY_TRACT
  Administered 2017-08-15: 04:00:00 via RESPIRATORY_TRACT
  Administered 2017-08-16: 3 mL via RESPIRATORY_TRACT
  Filled 2017-08-09 (×40): qty 4

## 2017-08-09 NOTE — Progress Notes (Signed)
Initial Nutrition Assessment  DOCUMENTATION CODES:   Obesity unspecified  INTERVENTION:   Initiate Vital High Protein @ 15  ml/hr via OGT and Add 45 ml Prostat QID.    Tube feeding regimen provides 960 kcal , 119 grams of protein, and 301 ml of H2O.    NUTRITION DIAGNOSIS:   Inadequate oral intake related to inability to eat as evidenced by NPO status.   GOAL:   Provide needs based on ASPEN/SCCM guidelines  MONITOR:   Vent status, Labs, I & O's, TF tolerance  REASON FOR ASSESSMENT:   Consult Enteral/tube feeding initiation and management  ASSESSMENT:  Patient admitted on Saturday. She has pneumonia, acute respiratory failure. Chest x-ray (white out left lung) per MD.   Unable to tolerate BiPAP and was intubated/sedated this morning.  She has hx of CHF, GERD, HTN, DM -2, HLD, IBS, dementia and gastroparesis.  MV: 10.2 L/min Temp (24hrs), Avg:98.8 F (37.1 C), Min:98.1 F (36.7 C), Max:99.2 F (37.3 C)  Propofol: 22.4 ml/hr -Provides 591 kcal lipids every 24 hr.   Labs and Meds reviewed. Hyperglycemia. Diuretic, PPI, mag-ox, MVI and insulin    Recent Labs  Lab 08/06/17 0646 08/07/17 0624 08/08/17 0427  NA 139 137 137  K 4.4 4.6 4.2  CL 97* 93* 93*  CO2 29 31 29   BUN 15 17 19   CREATININE 0.90 0.90 0.84  CALCIUM 8.8* 8.8* 8.8*  MG 1.8 1.9 2.0  GLUCOSE 270* 280* 253*     Diet Order:  Diet NPO time specified  EDUCATION NEEDS:   No education needs have been identified at this time  Skin:  Skin Assessment: Reviewed RN Assessment  Last BM:  08/07/17  Height:   Ht Readings from Last 1 Encounters:  08/05/17 5\' 8"  (1.727 m)    Weight:   Wt Readings from Last 1 Encounters:  08/09/17 235 lb 0.2 oz (106.6 kg)    Ideal Body Weight:  64 kg  BMI:  Body mass index is 35.73 kg/m.  Estimated Nutritional Needs:   Kcal:  8119-1478   Protein:  120-130 gr  Fluid:  per MD goals  Royann Shivers MS,RD,CSG,LDN Office: [phone] Pager: [phone]

## 2017-08-09 NOTE — Progress Notes (Signed)
[id number] [facility] called and reported that [name]'s ETA will be around 1630 for PICC line placement.

## 2017-08-09 NOTE — Addendum Note (Signed)
Addendum  created 08/09/17 1358 by Laurene Footman, MD   Intraprocedure Attestations filed

## 2017-08-09 NOTE — Progress Notes (Incomplete)
1520 Spoke with Kia at [facility] regarding new order for PICC line placement for pt. Kia d/t

## 2017-08-09 NOTE — Consult Note (Signed)
Consult requested by: [facility], Dr.Memon Consult requested for: Respiratory failure  HPI: This is a 67 year old who came to the emergency room because of cough and shortness of breath.  This is been going on for about 2 weeks.  She had taken oral antibiotics but failed those.  When she came to the emergency department she was noted to be tachypneic did not have fever but did have a large left lower lobe pneumonia.  She was started on treatment for community-acquired pneumonia that had failed outpatient treatment.  History is from the medical record is she is not able to give a history and her daughter who is at bedside also provides some history.  She has a history of chronic diastolic heart failure atrial fib, hypertension, hypothyroidism, obesity, obstructive sleep apnea not on CPAP apparently and seizure disorder on Keppra.  She was started on appropriate treatment but has continued to have more trouble.  Chest x-ray this morning is much worse.  She is on BiPAP with 70% oxygen that she is not tolerating well and she is very agitated.  Past Medical History:  Diagnosis Date  . Anemia    H/H of 10/30 with a normal MCV in 12/09  . Anxiety   . Arthritis   . Barrett's esophagus    Diagnosed 1995. Last EGD 2016-NO BARRETT'S.   . Chest pain    Negative cardiac catheterization in 2002; negative stress nuclear study in 2008  . Chronic anticoagulation   . Chronic diastolic CHF (congestive heart failure) (HCC)    a. EF predominantly normal during prior echoes but was 40% during 10/2014 echo. b. Most recent 01/2015 EF normal, 55-60%.  . Chronic LBP    Surgical intervention in 1996  . Diabetes mellitus, type 2 (HCC)    Insulin therapy; exacerbated by prednisone  . Dysrhythmia    AFib  . Gastroparesis    99% retention 05/2008 on GES  . GERD (gastroesophageal reflux disease)   . Hiatal hernia   . Hyperlipidemia   . Hypertension   . Hypothyroid   . IBS (irritable bowel syndrome)   .  Obesity   . OSA on CPAP    had CPAP and cannot tolerate.  . Paroxysmal atrial fibrillation ([place])   . Pulmonary hypertension ([place]) 01/2015   a. Predominantly pulmonary venous hypertension but may be component of PAH.  . Seizures ([place])    last seizure was 2 years ago; on keppra for this; unknown etiology  . Syncope    a. Admitted 05/2009; magnetic resonance imagin/ MRA - negative; etiology thought to be orthostasis secondary to drugs and dehydration. b. Syncope 02/2015 also felt 2/2 dehydration.     Family History  Problem Relation Age of Onset  . Hypertension Mother   . Alzheimer's disease Mother   . Stroke Mother   . Heart attack Mother   . Hypertension Other   . Breast cancer Sister   . Heart disease Neg Hx   . Colon cancer Neg Hx      Social History   Socioeconomic History  . Marital status: Married    Spouse name: None  . Number of children: None  . Years of education: None  . Highest education level: None  Social Needs  . Financial resource strain: None  . Food insecurity - worry: None  . Food insecurity - inability: None  . Transportation needs - medical: None  . Transportation needs - non-medical: None  Occupational History  . Occupation: Museum/gallery curator at [employer]  Employer: [employer]    Comment: disabled    Employer: RETIRED  Tobacco Use  . Smoking status: Former Smoker    Packs/day: 0.25    Years: 15.00    Pack years: 3.75    Types: Cigarettes    Start date: 02/26/1966    Last attempt to quit: 07/01/1983    Years since quitting: 34.1  . Smokeless tobacco: Never Used  . Tobacco comment: quit in 1984  Substance and Sexual Activity  . Alcohol use: No    Alcohol/week: 0.0 oz  . Drug use: No  . Sexual activity: Yes    Birth control/protection: None, Surgical  Other Topics Concern  . None  Social History Narrative   Sedentary   4 children, "blended family"     ROS: Unobtainable    Objective: Vital signs in last 24 hours: Temp:  [98.1 F (36.7  C)-99.2 F (37.3 C)] 98.1 F (36.7 C) (02/06 0813) Pulse Rate:  [76-113] 93 (02/06 0813) Resp:  [16-41] 18 (02/06 0813) BP: (80-172)/(37-145) 125/56 (02/06 0700) SpO2:  [88 %-94 %] 92 % (02/06 0813) FiO2 (%):  [60 %-70 %] 70 % (02/06 0802) Weight:  [106.6 kg (235 lb 0.2 oz)] 106.6 kg (235 lb 0.2 oz) (02/06 0500) Weight change: 0 kg (0 lb) Last BM Date: 08/07/17  Intake/Output from previous day: 02/05 0701 - 02/06 0700 In: 250 [IV Piggyback:250] Out: 1850 [Urine:1850]  PHYSICAL EXAM Constitutional: She is morbidly obese.  She is agitated and writhing around on the bed.  Eyes pupils react.  Ears nose mouth and throat: Mucous membranes appear to be mildly dry.  I cannot tell much else about her ears nose mouth and throat.  Cardiovascular: Her heart is regular with normal heart sounds respiratory: Her respiratory effort is markedly in less so on the right.  Gastrointestinal: Her abdomen is soft obese with no masses.  Musculoskeletal: She is moving all 4 extremities.  Neurological: Moving all 4 extremities but cannot make any other assessment.  Psychiatric: I cannot make any assessment.  Lab Results: Basic Metabolic Panel: Recent Labs    08/07/17 0624 08/08/17 0427  NA 137 137  K 4.6 4.2  CL 93* 93*  CO2 31 29  GLUCOSE 280* 253*  BUN 17 19  CREATININE 0.90 0.84  CALCIUM 8.8* 8.8*  MG 1.9 2.0   Liver Function Tests: Recent Labs    08/07/17 0624 08/08/17 0427  AST 20 27  ALT 19 20  ALKPHOS 52 52  BILITOT 0.5 1.2  PROT 7.1 7.3  ALBUMIN 3.5 3.5   No results for input(s): LIPASE, AMYLASE in the last 72 hours. No results for input(s): AMMONIA in the last 72 hours. CBC: No results for input(s): WBC, NEUTROABS, HGB, HCT, MCV, PLT in the last 72 hours. Cardiac Enzymes: No results for input(s): CKTOTAL, CKMB, CKMBINDEX, TROPONINI in the last 72 hours. BNP: No results for input(s): PROBNP in the last 72 hours. D-Dimer: No results for input(s): DDIMER in the last 72  hours. CBG: Recent Labs    08/07/17 2138 08/08/17 0814 08/08/17 1032 08/08/17 1209 08/08/17 1637 08/08/17 2139  GLUCAP 116* 340* 380* 394* 151* 404*   Hemoglobin A1C: No results for input(s): HGBA1C in the last 72 hours. Fasting Lipid Panel: No results for input(s): CHOL, HDL, LDLCALC, TRIG, CHOLHDL, LDLDIRECT in the last 72 hours. Thyroid Function Tests: No results for input(s): TSH, T4TOTAL, FREET4, T3FREE, THYROIDAB in the last 72 hours. Anemia Panel: No results for input(s): VITAMINB12, FOLATE, FERRITIN, TIBC,  IRON, RETICCTPCT in the last 72 hours. Coagulation: No results for input(s): LABPROT, INR in the last 72 hours. Urine Drug Screen: Drugs of Abuse  No results found for: LABOPIA, COCAINSCRNUR, LABBENZ, AMPHETMU, THCU, LABBARB  Alcohol Level: No results for input(s): ETH in the last 72 hours. Urinalysis: No results for input(s): COLORURINE, LABSPEC, PHURINE, GLUCOSEU, HGBUR, BILIRUBINUR, KETONESUR, PROTEINUR, UROBILINOGEN, NITRITE, LEUKOCYTESUR in the last 72 hours.  Invalid input(s): APPERANCEUR Misc. Labs:   ABGS: Recent Labs    08/08/17 2330  PHART 7.366  PO2ART 84.2  HCO3 25.4     MICROBIOLOGY: Recent Results (from the past 240 hour(s))  Culture, blood (routine x 2) Call MD if unable to obtain prior to antibiotics being given     Status: None (Preliminary result)   Collection Time: 08/05/17 12:02 PM  Result Value Ref Range Status   Specimen Description LEFT ANTECUBITAL  Final   Special Requests   Final    BOTTLES DRAWN AEROBIC AND ANAEROBIC Blood Culture adequate volume   Culture   Final    NO GROWTH 4 DAYS Performed at [facility], [address], [place], [place] [address]    Report Status PENDING  Incomplete  Respiratory Panel by PCR     Status: None   Collection Time: 08/05/17 12:37 PM  Result Value Ref Range Status   Adenovirus NOT DETECTED NOT DETECTED Final   Coronavirus 229E NOT DETECTED NOT DETECTED Final   Coronavirus HKU1 NOT  DETECTED NOT DETECTED Final   Coronavirus NL63 NOT DETECTED NOT DETECTED Final   Coronavirus OC43 NOT DETECTED NOT DETECTED Final   Metapneumovirus NOT DETECTED NOT DETECTED Final   Rhinovirus / Enterovirus NOT DETECTED NOT DETECTED Final   Influenza A NOT DETECTED NOT DETECTED Final   Influenza B NOT DETECTED NOT DETECTED Final   Parainfluenza Virus 1 NOT DETECTED NOT DETECTED Final   Parainfluenza Virus 2 NOT DETECTED NOT DETECTED Final   Parainfluenza Virus 3 NOT DETECTED NOT DETECTED Final   Parainfluenza Virus 4 NOT DETECTED NOT DETECTED Final   Respiratory Syncytial Virus NOT DETECTED NOT DETECTED Final   Bordetella pertussis NOT DETECTED NOT DETECTED Final   Chlamydophila pneumoniae NOT DETECTED NOT DETECTED Final   Mycoplasma pneumoniae NOT DETECTED NOT DETECTED Final    Comment: Performed at [facility], [place] [address] [place], [place] [address]    Studies/Results: Dg Chest Port 1 View  Result Date: 08/08/2017 CLINICAL DATA:  Respiratory failure. EXAM: PORTABLE CHEST 1 VIEW COMPARISON:  August 05, 2017 FINDINGS: There has been progression of airspace consolidation on the left. There is now airspace consolidation throughout most of the left lung. There is a small left pleural effusion. The right lung is clear. There is cardiomegaly with pulmonary venous hypertension. There is aortic atherosclerosis. No adenopathy. No evident bone lesions. IMPRESSION: Consolidation throughout most of the left lung felt to represent widespread pneumonia, progressed from recent study. Right lung clear. Underlying pulmonary vascular congestion. There is aortic atherosclerosis. Aortic Atherosclerosis (ICD10-I70.0). Electronically Signed   By: Lowella Grip III M.D.   On: 08/08/2017 09:27    Medications:  Prior to Admission:  Medications Prior to Admission  Medication Sig Dispense Refill Last Dose  . acetaminophen (TYLENOL) 500 MG tablet Take 500 mg by mouth every 6 (six) hours as needed for  moderate pain or headache.   08/05/2017 at 0630  . albuterol (PROVENTIL HFA;VENTOLIN HFA) 108 (90 Base) MCG/ACT inhaler Inhale 2 puffs into the lungs every 4 (four) hours as needed  for wheezing or shortness of breath. 1 Inhaler 0 08/04/2017 at Unknown time  . amiodarone (PACERONE) 200 MG tablet Take 1 tablet (200 mg total) by mouth every morning. 90 tablet 3 08/05/2017 at Unknown time  . amoxicillin (AMOXIL) 500 MG capsule Take 1 capsule by mouth 3 (three) times daily.  0 08/05/2017 at 0700  . apixaban (ELIQUIS) 5 MG TABS tablet Take 1 tablet (5 mg total) by mouth 2 (two) times daily. 60 tablet 0 08/05/2017 at Unknown time  . Cyanocobalamin (B-12) 5000 MCG SUBL Take 1 tablet by mouth daily.  12 08/05/2017 at 0700  . dicyclomine (BENTYL) 10 MG capsule TAKE 1 TO 2 CAPSULES BY MOUTH BEFORE meals AS NEEDED 180 capsule 3 08/04/2017 at Unknown time  . DULoxetine (CYMBALTA) 30 MG capsule Take 30 mg by mouth 2 (two) times daily.   08/05/2017 at 0700  . famotidine (PEPCID) 20 MG tablet Take 20 mg by mouth at bedtime.    08/04/2017 at Unknown time  . furosemide (LASIX) 40 MG tablet TAKE 40 MG DAILY. 90 tablet 3 08/05/2017 at Unknown time  . gabapentin (NEURONTIN) 400 MG capsule Take 400-800 mg by mouth 3 (three) times daily. Take 1 capsule(433m) by mouth twice a day and take 2 capsules(804m by mouth at bedtime  11 08/05/2017 at 0700  . glipiZIDE (GLUCOTROL) 10 MG tablet Take 10 mg by mouth 2 (two) times daily before a meal.    08/05/2017 at Unknown time  . Insulin Glargine (LANTUS SOLOSTAR) 100 UNIT/ML Solostar Pen Inject 20-50 Units into the skin at bedtime as needed. Patient takes 20 units every night at bedtime if over 178.  Takes 50 units if her blood sugar is above 300.   08/04/2017 at 2200  . levETIRAcetam (KEPPRA) 1000 MG tablet Take 1,000 mg by mouth 2 (two) times daily.  1 08/05/2017 at 0700  . levothyroxine (SYNTHROID, LEVOTHROID) 137 MCG tablet Take 137 mcg by mouth daily.   08/05/2017 at 0700  . loperamide (IMODIUM) 2 MG  capsule TAKE ONE CAPSULE BY MOUTH TWICE DAILY AS NEEDED 60 capsule 3 08/04/2017 at Unknown time  . LORazepam (ATIVAN) 1 MG tablet Take 1 mg by mouth 3 (three) times daily as needed for anxiety or sleep. For anxiety   08/04/2017 at 2200  . magnesium oxide (MAG-OX) 400 MG tablet Take 400 mg by mouth daily.    08/05/2017 at Unknown time  . metaxalone (SKELAXIN) 800 MG tablet Take 800 mg by mouth 3 (three) times daily.    08/05/2017 at 0700  . metFORMIN (GLUCOPHAGE) 1000 MG tablet Take 1,000 mg by mouth 2 (two) times daily with a meal.    08/05/2017 at 0700  . Multiple Vitamin (MULTIVITAMIN WITH MINERALS) TABS tablet Take 1 tablet by mouth daily.   08/05/2017 at 0700  . nitroGLYCERIN (NITROSTAT) 0.4 MG SL tablet Place 0.4 mg under the tongue every 5 (five) minutes as needed for chest pain. Reported on 07/08/2015   Taking  . ondansetron (ZOFRAN) 4 MG tablet TAKE ONE TABLET BY MOUTH FOUR TIMES DAILY FOR NAUSEA (Patient taking differently: TAKE ONE TABLET BY MOUTH FOUR TIMES DAILY FOR NAUSEA. Takes as needed) 120 tablet 3 Past Week at Unknown time  . pantoprazole (PROTONIX) 40 MG tablet TAKE ONE TABLET BY MOUTH TWICE DAILY BEFORE APPLY MEAL 180 tablet 3 08/05/2017 at 0700  . potassium chloride (K-DUR,KLOR-CON) 10 MEQ tablet TAKE TWO TABLETS BY MOUTH TWICE DAILY 120 tablet 11 08/05/2017 at 0700  . promethazine (PHENERGAN) 12.5 MG tablet  Take 1 tablet (12.5 mg total) by mouth every 4 (four) hours as needed for nausea or vomiting. 42 tablet 0 Taking  . UNABLE TO FIND Take 1 tablet by mouth 2 (two) times daily. Medication Name: [employer] RX Multivitamin Diabetes Support  12 08/05/2017 at 0700   Scheduled: . acetylcysteine  3 mL Nebulization Q4H  . amiodarone  200 mg Oral q morning - 10a  . apixaban  5 mg Oral BID  . chlorhexidine gluconate (MEDLINE KIT)  15 mL Mouth Rinse BID  . dextromethorphan  30 mg Oral BID  . DULoxetine  30 mg Oral BID  . furosemide  40 mg Intravenous Daily  . gabapentin  400 mg Oral BID  . gabapentin   800 mg Oral QHS  . insulin glargine  30 Units Subcutaneous Q2200  . ipratropium-albuterol  3 mL Nebulization Q4H  . ketoconazole   Topical BID  . levETIRAcetam  1,000 mg Oral BID  . levothyroxine  137 mcg Oral QAC breakfast  . magnesium oxide  400 mg Oral Daily  . mouth rinse  15 mL Mouth Rinse QID  . methylPREDNISolone (SOLU-MEDROL) injection  40 mg Intravenous Q12H  . multivitamin with minerals  1 tablet Oral Daily  . nystatin   Topical TID  . pantoprazole (PROTONIX) IV  40 mg Intravenous Q24H  . potassium chloride  20 mEq Oral Daily   Continuous: . ceFEPime (MAXIPIME) IV Stopped (08/09/17 0155)  . propofol (DIPRIVAN) infusion    . vancomycin 1,000 mg (08/09/17 0620)   FVW:[id number], albuterol, fentaNYL (SUBLIMAZE) injection, fentaNYL (SUBLIMAZE) injection, haloperidol lactate, insulin aspart, loperamide, LORazepam, nitroGLYCERIN, ondansetron (ZOFRAN) IV  Assesment: She has community-acquired pneumonia and failed outpatient treatment.  She now has what looks like a "white out" of her left lung on chest x-ray that I have personally reviewed.  She is very agitated.  She is on BiPAP at 70% oxygen.  I think she needs to have intubation and mechanical ventilation to help clear her secretions and we can give her effective medications to help her calm down.  She has sleep apnea which is contributing to her problem.  She has paroxysmal atrial fib which is ongoing.   Principal Problem:   CAP Failed Outpatient Tx Active Problems:   Hypothyroidism   Hyperlipidemia   Essential hypertension   Atrial fibrillation (HCC)   Congestive heart failure (HCC)   Gastroparesis   Sleep apnea   Chronic anticoagulation   Tremor   Dementia   DM type 2 (diabetes mellitus, type 2) (HCC)   Pulmonary hypertension (HCC)   COPD exacerbation ([place])    Plan: At this point I think we need to go ahead and intubate her and put her on mechanical ventilation.  I will use propofol for sedation because  she is so agitated.  I will start her on Mucomyst to see if it will help with what I think is likely a mucous plug.  Discussed by phone with Dr.Memon    LOS: 4 days   [name] 08/09/2017, 8:16 AM

## 2017-08-09 NOTE — Progress Notes (Signed)
Inpatient Diabetes Program Recommendations  AACE/ADA: New Consensus Statement on Inpatient Glycemic Control (2015)  Target Ranges:  Prepandial:   less than 140 mg/dL      Peak postprandial:   less than 180 mg/dL (1-2 hours)      Critically ill patients:  140 - 180 mg/dL   Results for AMINTA, COMI (MRN [id number]) as of 08/09/2017 10:44  Ref. Range 08/08/2017 08:14 08/08/2017 10:32 08/08/2017 12:09 08/08/2017 16:37 08/08/2017 21:39 08/09/2017 08:10  Glucose-Capillary Latest Ref Range: 65 - 99 mg/dL 203 (H) 559 (H) 741 (H) 151 (H) 404 (H) 268 (H)   Review of Glycemic Control  Diabetes history: DM2 Outpatient Diabetes medications:Lantus 20 units QHS if glucose >178 mg/dl (or takes 50 units if glucose >300 mg/dl), Glipizide 10 mg BID, Metformin 1000 mg BID Current orders for Inpatient glycemic control:Lantus 30 units QHS,Novolog 0-15 units Q4H; Solumedrol 40 mg Q12H   Inpatient Diabetes Program Recommendations: Insulin - Basal: Noted glucose up to 404 mg/dl on 12/05/82; likely from not getting any Lantus on 08/07/17. Please consider increasing Lantus to 35 units QHS.  Thanks, Orlando Penner, RN, MSN, CDE Diabetes Coordinator Inpatient Diabetes Program [phone] (Team Pager from 8am to 5pm)

## 2017-08-09 NOTE — Progress Notes (Addendum)
PROGRESS NOTE    [name]  WLN:[id number] DOB: 11/03/1950 DOA: 08/05/2017 PCP: Lemmie Evens, MD    Brief Narrative:  [name] a 67 y.o.femalewith multiple medical comorbidities detailed below presented to the emergency department with persistent cough and shortness of breath for the past 2 weeks that failed outpatient management with 2 courses of oral amoxicillin. Patient reports that she is on her second round of amoxicillin and continues to feel short of breath. She has nonproductive cough. She has some wheezing. She has no chest pain. The patient has no swelling in the extremities. She has a congestive heart failure that is chronic and she is taking her diuretics regularly. She denies palpitations. She presented to the ED and noted to be tachypneic. She was afebrile. A chest x-ray was obtained that did show a large left retro-cardiac opacity that was suspicious for pneumonia. EKG was unremarkable except for notation of prolonged PR interval. Her troponin was 0.07. She had a mildly elevated BNP at 189. The patient's blood sugar was elevated at 236. She was given treatment in the emergency department but remained tachypneic. She is going to be admitted for community-acquired pneumonia that has failed outpatient treatment.  Assessment & Plan:   Principal Problem:   CAP Failed Outpatient Tx Active Problems:   Hypothyroidism   Hyperlipidemia   Essential hypertension   Atrial fibrillation (HCC)   Congestive heart failure (HCC)   Gastroparesis   Sleep apnea   Chronic anticoagulation   Tremor   Dementia   DM type 2 (diabetes mellitus, type 2) (HCC)   Pulmonary hypertension (HCC)   COPD exacerbation ([place])   1. Acute respiratory failure with hypoxia and hypercapnia.  Related to progressive pneumonia.  She also has underlying COPD.  Patient was tried on BiPAP, but was still very short of breath and did not have any significant improvement.  She required  intubation with mechanical ventilation on 2/6.  Pulmonology following.  Continue current treatments. 2. Community-acquired pneumonia.  On ceftriaxone and azithromycin.  Chest x-ray showed progressive worsening of pneumonia.  Antibiotics changed to vancomycin and cefepime.  There may be a component of a mucous plug.  On Mucomyst nebulizer treatments. 3. COPD.  On nebulizer treatments and intravenous steroids.  No wheezing at this time. 4. Chronic diastolic heart failure.  Appears compensated at this time.  Currently on IV Lasix.  Monitor renal function. 5. Hypertension.  Blood pressures currently stable 6. Type 2 diabetes.  On basal insulin as well as sliding scale.  Blood sugars are running high.  Will increase basal insulin. 7. Paroxysmal atrial fibrillation.  Heart rate is currently controlled on amiodarone.  Anticoagulated with Eliquis. 8. Hypothyroidism.  Continue on Synthroid 9. Seizure disorder.  Continue on Keppra   DVT prophylaxis: apixiban Code Status: full code Family Communication: discussed with daughter at the bedside Disposition Plan: pending hospital course, may need placement   Consultants:   pulmonology  Procedures:   Intubation 2/6>  Antimicrobials:   Vancomycin 2/5>  Cefepime 2/5>  Ceftriaxone 2/2>2/5  Azithromycin 2/2>2/5   Subjective: Intubated and sedated  Objective: Vitals:   08/09/17 0802 08/09/17 0813 08/09/17 0932 08/09/17 0938  BP:      Pulse:  93    Resp:  18    Temp:  98.1 F (36.7 C)    TempSrc:  Axillary    SpO2: 94% 92% 98% 97%  Weight:      Height:        Intake/Output Summary (Last 24  hours) at 08/09/2017 0942 Last data filed at 08/09/2017 0700 Gross per 24 hour  Intake 250 ml  Output 1850 ml  Net -1600 ml   Filed Weights   08/05/17 1140 08/08/17 0500 08/09/17 0500  Weight: 110.8 kg (244 lb 4.3 oz) 106.6 kg (235 lb 0.2 oz) 106.6 kg (235 lb 0.2 oz)    Examination:  General exam: Appears calm and comfortable  Respiratory  system: bilateral rhonchi. Respiratory effort normal. Cardiovascular system: S1 & S2 heard, RRR. No JVD, murmurs, rubs, gallops or clicks.  Gastrointestinal system: Abdomen is nondistended, soft and nontender. No organomegaly or masses felt. Normal bowel sounds heard. Central nervous system:  No focal neurological deficits. Extremities: no cyanosis or edema Skin: No rashes, lesions or ulcers Psychiatry: sedated    Data Reviewed: I have personally reviewed following labs and imaging studies  CBC: Recent Labs  Lab 08/05/17 0847  WBC 6.8  HGB 12.5  HCT 40.5  MCV 102.0*  PLT 350   Basic Metabolic Panel: Recent Labs  Lab 08/05/17 0847 08/06/17 0646 08/07/17 0624 08/08/17 0427  NA 135 139 137 137  K 3.9 4.4 4.6 4.2  CL 95* 97* 93* 93*  CO2 _0 GLUCOSE 236* 270* 280* 253*  BUN _1 CREATININE 1.11* 0.90 0.90 0.84  CALCIUM 8.4* 8.8* 8.8* 8.8*  MG  --  1.8 1.9 2.0   GFR: Estimated Creatinine Clearance: 82 mL/min (by C-G formula based on SCr of 0.84 mg/dL). Liver Function Tests: Recent Labs  Lab 08/06/17 0646 08/07/17 0624 08/08/17 0427  AST _2 ALT _3 ALKPHOS 59 52 52  BILITOT 0.6 0.5 1.2  PROT 7.1 7.1 7.3  ALBUMIN 3.5 3.5 3.5   No results for input(s): LIPASE, AMYLASE in the last 168 hours. No results for input(s): AMMONIA in the last 168 hours. Coagulation Profile: No results for input(s): INR, PROTIME in the last 168 hours. Cardiac Enzymes: No results for input(s): CKTOTAL, CKMB, CKMBINDEX, TROPONINI in the last 168 hours. BNP (last 3 results) No results for input(s): PROBNP in the last 8760 hours. HbA1C: No results for input(s): HGBA1C in the last 72 hours. CBG: Recent Labs  Lab 08/08/17 1032 08/08/17 1209 08/08/17 1637 08/08/17 2139 08/09/17 0810  GLUCAP 380* 394* 151* 404* 268*   Lipid Profile: No results for input(s): CHOL, HDL, LDLCALC, TRIG, CHOLHDL, LDLDIRECT in the last 72 hours. Thyroid Function Tests: No  results for input(s): TSH, T4TOTAL, FREET4, T3FREE, THYROIDAB in the last 72 hours. Anemia Panel: No results for input(s): VITAMINB12, FOLATE, FERRITIN, TIBC, IRON, RETICCTPCT in the last 72 hours. Sepsis Labs: No results for input(s): PROCALCITON, LATICACIDVEN in the last 168 hours.  Recent Results (from the past 240 hour(s))  Culture, blood (routine x 2) Call MD if unable to obtain prior to antibiotics being given     Status: None (Preliminary result)   Collection Time: 08/05/17 12:02 PM  Result Value Ref Range Status   Specimen Description LEFT ANTECUBITAL  Final   Special Requests   Final    BOTTLES DRAWN AEROBIC AND ANAEROBIC Blood Culture adequate volume   Culture   Final    NO GROWTH 4 DAYS Performed at [facility], [address], [place], [place] [address]    Report Status PENDING  Incomplete  Respiratory Panel by PCR     Status: None   Collection Time: 08/05/17 12:37 PM  Result Value Ref Range Status   Adenovirus NOT DETECTED  NOT DETECTED Final   Coronavirus 229E NOT DETECTED NOT DETECTED Final   Coronavirus HKU1 NOT DETECTED NOT DETECTED Final   Coronavirus NL63 NOT DETECTED NOT DETECTED Final   Coronavirus OC43 NOT DETECTED NOT DETECTED Final   Metapneumovirus NOT DETECTED NOT DETECTED Final   Rhinovirus / Enterovirus NOT DETECTED NOT DETECTED Final   Influenza A NOT DETECTED NOT DETECTED Final   Influenza B NOT DETECTED NOT DETECTED Final   Parainfluenza Virus 1 NOT DETECTED NOT DETECTED Final   Parainfluenza Virus 2 NOT DETECTED NOT DETECTED Final   Parainfluenza Virus 3 NOT DETECTED NOT DETECTED Final   Parainfluenza Virus 4 NOT DETECTED NOT DETECTED Final   Respiratory Syncytial Virus NOT DETECTED NOT DETECTED Final   Bordetella pertussis NOT DETECTED NOT DETECTED Final   Chlamydophila pneumoniae NOT DETECTED NOT DETECTED Final   Mycoplasma pneumoniae NOT DETECTED NOT DETECTED Final    Comment: Performed at [facility], [place] [address], [place], [place]  [address]         Radiology Studies: Portable Chest X-ray  Result Date: 08/09/2017 CLINICAL DATA:  Endotracheal tube placement. EXAM: PORTABLE CHEST 1 VIEW COMPARISON:  Radiograph of August 08, 2017. FINDINGS: Mild cardiomegaly is noted with central pulmonary vascular congestion. Endotracheal tube is seen projected over tracheal air shadow with distal tip 2 cm above the carina. Nasogastric tube is seen passing through the esophagus into the stomach. Stable bilateral lung opacities are noted, left greater than right, most consistent with pneumonia or possibly edema. No pneumothorax is noted. Probable left pleural effusion cannot be excluded. Bony thorax is unremarkable. IMPRESSION: Endotracheal and nasogastric tubes are in grossly good position. Stable bilateral lung opacities are noted most consistent with pneumonia or possibly edema. Left pleural effusion cannot be excluded. Electronically Signed   By: Marijo Conception, M.D.   On: 08/09/2017 09:28   Dg Chest Port 1 View  Result Date: 08/08/2017 CLINICAL DATA:  Respiratory failure. EXAM: PORTABLE CHEST 1 VIEW COMPARISON:  August 05, 2017 FINDINGS: There has been progression of airspace consolidation on the left. There is now airspace consolidation throughout most of the left lung. There is a small left pleural effusion. The right lung is clear. There is cardiomegaly with pulmonary venous hypertension. There is aortic atherosclerosis. No adenopathy. No evident bone lesions. IMPRESSION: Consolidation throughout most of the left lung felt to represent widespread pneumonia, progressed from recent study. Right lung clear. Underlying pulmonary vascular congestion. There is aortic atherosclerosis. Aortic Atherosclerosis (ICD10-I70.0). Electronically Signed   By: Lowella Grip III M.D.   On: 08/08/2017 09:27        Scheduled Meds: . acetylcysteine  3 mL Nebulization Q4H  . amiodarone  200 mg Oral q morning - 10a  . apixaban  5 mg Oral BID  .  chlorhexidine gluconate (MEDLINE KIT)  15 mL Mouth Rinse BID  . dextromethorphan  30 mg Oral BID  . DULoxetine  30 mg Oral BID  . furosemide  40 mg Intravenous Daily  . gabapentin  400 mg Oral BID  . gabapentin  800 mg Oral QHS  . insulin glargine  30 Units Subcutaneous Q2200  . ipratropium-albuterol  3 mL Nebulization Q4H  . ketoconazole   Topical BID  . levETIRAcetam  1,000 mg Oral BID  . levothyroxine  137 mcg Oral QAC breakfast  . magnesium oxide  400 mg Oral Daily  . mouth rinse  15 mL Mouth Rinse QID  . methylPREDNISolone (SOLU-MEDROL) injection  40 mg Intravenous Q12H  .  multivitamin with minerals  1 tablet Oral Daily  . nystatin   Topical TID  . pantoprazole (PROTONIX) IV  40 mg Intravenous Q24H  . potassium chloride  20 mEq Oral Daily   Continuous Infusions: . ceFEPime (MAXIPIME) IV Stopped (08/09/17 0155)  . propofol (DIPRIVAN) infusion 35 mcg/kg/min (08/09/17 0933)  . vancomycin 1,000 mg (08/09/17 0620)     LOS: 4 days    Time spent: critical care time: 71mns    JKathie Dike MD [facility] Pager [phone] If 7PM-7AM, please contact night-coverage [email or web address] Password [email or web address] LP2/12/2017, 9:42 AM   Addendum 18:30:   Patient was agitated earlier this morning.  She was on propofol will blood pressures were running low.  This was transitioned to fentanyl drip with intermittent boluses of midazolam.  Blood pressure has improved, but she continues to be agitated.  Will transition Mutaz Lamb to infusion to be titrated for sedation.  [employer]on

## 2017-08-09 NOTE — Anesthesia Procedure Notes (Signed)
Procedure Name: Intubation Date/Time: 08/09/2017 8:45 AM Performed by: Vista Deck, CRNA Pre-anesthesia Checklist: Patient identified, Patient being monitored, Timeout performed, Emergency Drugs available and Suction available Patient Re-evaluated:Patient Re-evaluated prior to induction Oxygen Delivery Method: Ambu bag Preoxygenation: Pre-oxygenation with 100% oxygen Induction Type: IV induction Ventilation: Mask ventilation without difficulty Laryngoscope Size: Mac and 3 Grade View: Grade I Tube type: Subglottic suction tube Tube size: 7.0 mm Number of attempts: 1 Airway Equipment and Method: stylet Placement Confirmation: ETT inserted through vocal cords under direct vision,  breath sounds checked- equal and bilateral and CO2 detector Secured at: 23 cm Tube secured with: secured by respiratory. Dental Injury: Teeth and Oropharynx as per pre-operative assessment

## 2017-08-10 ENCOUNTER — Inpatient Hospital Stay (HOSPITAL_COMMUNITY): Payer: Medicare Other

## 2017-08-10 LAB — COMPREHENSIVE METABOLIC PANEL
ALBUMIN: 2.8 g/dL — AB (ref 3.5–5.0)
ALT: 21 U/L (ref 14–54)
ANION GAP: 13 (ref 5–15)
AST: 31 U/L (ref 15–41)
Alkaline Phosphatase: 47 U/L (ref 38–126)
BILIRUBIN TOTAL: 0.9 mg/dL (ref 0.3–1.2)
BUN: 35 mg/dL — AB (ref 6–20)
CHLORIDE: 96 mmol/L — AB (ref 101–111)
CO2: 31 mmol/L (ref 22–32)
Calcium: 8.8 mg/dL — ABNORMAL LOW (ref 8.9–10.3)
Creatinine, Ser: 0.9 mg/dL (ref 0.44–1.00)
GFR calc Af Amer: >60 mL/min (ref >60)
GLUCOSE: 391 mg/dL — AB (ref 65–99)
POTASSIUM: 3.6 mmol/L (ref 3.5–5.1)
Sodium: 140 mmol/L (ref 135–145)
TOTAL PROTEIN: 6.5 g/dL (ref 6.5–8.1)

## 2017-08-10 LAB — CBC
HEMATOCRIT: 35.3 % — AB (ref 36.0–46.0)
Hemoglobin: 11 g/dL — ABNORMAL LOW (ref 12.0–15.0)
MCH: 31.7 pg (ref 26.0–34.0)
MCHC: 31.2 g/dL (ref 30.0–36.0)
MCV: 101.7 fL — ABNORMAL HIGH (ref 78.0–100.0)
PLATELETS: 166 10*3/uL (ref 150–400)
RBC: 3.47 MIL/uL — ABNORMAL LOW (ref 3.87–5.11)
RDW: 14.8 % (ref 11.5–15.5)
WBC: 4.5 10*3/uL (ref 4.0–10.5)

## 2017-08-10 LAB — GLUCOSE, CAPILLARY
GLUCOSE-CAPILLARY: 390 mg/dL — AB (ref 65–99)
GLUCOSE-CAPILLARY: 424 mg/dL — AB (ref 65–99)
Glucose-Capillary: 399 mg/dL — ABNORMAL HIGH (ref 65–99)
Glucose-Capillary: 441 mg/dL — ABNORMAL HIGH (ref 65–99)
Glucose-Capillary: 249 mg/dL — ABNORMAL HIGH (ref 65–99)
Glucose-Capillary: 415 mg/dL — ABNORMAL HIGH (ref 65–99)

## 2017-08-10 LAB — CULTURE, BLOOD (ROUTINE X 2)
CULTURE: NO GROWTH
Special Requests: ADEQUATE

## 2017-08-10 MED ORDER — FENTANYL CITRATE (PF) 2500 MCG/50ML IJ SOLN
INTRAMUSCULAR | Status: AC
  Filled 2017-08-10: qty 50

## 2017-08-10 MED ORDER — VITAL HIGH PROTEIN PO LIQD
1000.0000 mL | ORAL | Status: DC
  Administered 2017-08-10: 1000 mL
  Filled 2017-08-10 (×4): qty 1000

## 2017-08-10 MED ORDER — INSULIN ASPART 100 UNIT/ML ~~LOC~~ SOLN
8.0000 [IU] | Freq: Once | SUBCUTANEOUS | Status: AC
  Administered 2017-08-11: 8 [IU] via SUBCUTANEOUS

## 2017-08-10 MED ORDER — INSULIN ASPART 100 UNIT/ML ~~LOC~~ SOLN
0.0000 [IU] | SUBCUTANEOUS | Status: DC
  Administered 2017-08-10: 20 [IU] via SUBCUTANEOUS
  Administered 2017-08-10: 7 [IU] via SUBCUTANEOUS
  Administered 2017-08-10: 25 [IU] via SUBCUTANEOUS
  Administered 2017-08-10 (×2): 20 [IU] via SUBCUTANEOUS
  Administered 2017-08-11: 15 [IU] via SUBCUTANEOUS
  Administered 2017-08-11: 7 [IU] via SUBCUTANEOUS
  Administered 2017-08-11: 11 [IU] via SUBCUTANEOUS
  Administered 2017-08-11 (×2): 15 [IU] via SUBCUTANEOUS
  Administered 2017-08-12 (×2): 11 [IU] via SUBCUTANEOUS
  Administered 2017-08-12 (×2): 15 [IU] via SUBCUTANEOUS
  Administered 2017-08-12: 11 [IU] via SUBCUTANEOUS
  Administered 2017-08-12 (×2): 15 [IU] via SUBCUTANEOUS
  Administered 2017-08-13 (×2): 11 [IU] via SUBCUTANEOUS
  Administered 2017-08-13: 4 [IU] via SUBCUTANEOUS
  Administered 2017-08-13: 11 [IU] via SUBCUTANEOUS
  Administered 2017-08-13: 15 [IU] via SUBCUTANEOUS
  Administered 2017-08-13 – 2017-08-14 (×2): 4 [IU] via SUBCUTANEOUS
  Administered 2017-08-14: 7 [IU] via SUBCUTANEOUS
  Administered 2017-08-14: 15 [IU] via SUBCUTANEOUS
  Administered 2017-08-14: 20 [IU] via SUBCUTANEOUS
  Administered 2017-08-14: 11 [IU] via SUBCUTANEOUS
  Administered 2017-08-14: 20 [IU] via SUBCUTANEOUS
  Administered 2017-08-15: 15 [IU] via SUBCUTANEOUS
  Administered 2017-08-15 (×2): 11 [IU] via SUBCUTANEOUS
  Administered 2017-08-15: 15 [IU] via SUBCUTANEOUS
  Administered 2017-08-15: 11 [IU] via SUBCUTANEOUS
  Administered 2017-08-16 (×2): 4 [IU] via SUBCUTANEOUS
  Administered 2017-08-16: 7 [IU] via SUBCUTANEOUS
  Administered 2017-08-16: 4 [IU] via SUBCUTANEOUS
  Administered 2017-08-16: 11 [IU] via SUBCUTANEOUS
  Administered 2017-08-17 (×2): 4 [IU] via SUBCUTANEOUS
  Administered 2017-08-17 (×2): 11 [IU] via SUBCUTANEOUS
  Administered 2017-08-17 (×2): 7 [IU] via SUBCUTANEOUS
  Administered 2017-08-18: 4 [IU] via SUBCUTANEOUS
  Administered 2017-08-18: 3 [IU] via SUBCUTANEOUS
  Administered 2017-08-18: 4 [IU] via SUBCUTANEOUS
  Administered 2017-08-18: 11 [IU] via SUBCUTANEOUS
  Administered 2017-08-18 – 2017-08-19 (×2): 4 [IU] via SUBCUTANEOUS
  Administered 2017-08-19 (×2): 7 [IU] via SUBCUTANEOUS
  Administered 2017-08-19: 3 [IU] via SUBCUTANEOUS
  Administered 2017-08-19: 4 [IU] via SUBCUTANEOUS
  Administered 2017-08-20: 7 [IU] via SUBCUTANEOUS
  Administered 2017-08-20 (×2): 4 [IU] via SUBCUTANEOUS
  Administered 2017-08-20 (×2): 7 [IU] via SUBCUTANEOUS
  Administered 2017-08-21: 4 [IU] via SUBCUTANEOUS
  Administered 2017-08-21: 7 [IU] via SUBCUTANEOUS
  Administered 2017-08-21 (×4): 4 [IU] via SUBCUTANEOUS

## 2017-08-10 MED ORDER — INSULIN ASPART 100 UNIT/ML ~~LOC~~ SOLN
8.0000 [IU] | Freq: Once | SUBCUTANEOUS | Status: AC
  Administered 2017-08-10: 8 [IU] via SUBCUTANEOUS

## 2017-08-10 MED ORDER — VITAL HIGH PROTEIN PO LIQD
1000.0000 mL | ORAL | Status: DC
  Filled 2017-08-10: qty 1000

## 2017-08-10 MED ORDER — FUROSEMIDE 40 MG PO TABS
40.0000 mg | ORAL_TABLET | Freq: Every day | ORAL | Status: DC
  Administered 2017-08-11 – 2017-08-18 (×8): 40 mg via ORAL
  Filled 2017-08-10 (×8): qty 1

## 2017-08-10 MED ORDER — INSULIN GLARGINE 100 UNIT/ML ~~LOC~~ SOLN
45.0000 [IU] | Freq: Every day | SUBCUTANEOUS | Status: DC
  Administered 2017-08-10: 45 [IU] via SUBCUTANEOUS
  Filled 2017-08-10 (×4): qty 0.45

## 2017-08-10 NOTE — Progress Notes (Signed)
Inpatient Diabetes Program Recommendations  AACE/ADA: New Consensus Statement on Inpatient Glycemic Control (2015)  Target Ranges:  Prepandial:   less than 140 mg/dL      Peak postprandial:   less than 180 mg/dL (1-2 hours)      Critically ill patients:  140 - 180 mg/dL   Results for MARIANAH, FAGERBERG (MRN [id number]) as of 08/10/2017 08:08  Ref. Range 08/09/2017 08:10 08/09/2017 11:07 08/09/2017 16:33 08/09/2017 20:11 08/09/2017 23:40 08/10/2017 03:58 08/10/2017 07:46  Glucose-Capillary Latest Ref Range: 65 - 99 mg/dL 937 (H) 169 (H) 678 (H) 462 (H) 422 (H) 390 (H) 399 (H)   Review of Glycemic Control  Diabetes history: DM2 Outpatient Diabetes medications: Lantus 20 units QHS if glucose >178 mg/dl (or takes 50 units if glucose >300 mg/dl), Glipizide 10 mg BID, Metformin 1000 mg BID Current orders for Inpatient glycemic control:Lantus35units QHS,Novolog 0-15 units Q4H; Solumedrol 40 mg Q12H  Inpatient Diabetes Program Recommendations:  Insulin - Basal: If steroids are continued as ordered, please consider increasing Lantus to 45 units QHS. Correction (SSI): Since patient is intubated, on steroids, receiving tube feedings, and glucose is consistently greater than 180 mg/dl, recommend discontinuing current Novolog correction scale and using ICU Glycemic Control order set to order Novolog correction.  Thanks, Orlando Penner, RN, MSN, CDE Diabetes Coordinator Inpatient Diabetes Program [phone] (Team Pager from 8am to 5pm)

## 2017-08-10 NOTE — Progress Notes (Signed)
Subjective: She remains intubated and on mechanical ventilation.  Propofol worked well for sedation but caused her to have hypotension so she was switched to fentanyl but that required very large doses so she is now on a Versed drip which seems to be working well.  She looks comfortable now.  Objective: Vital signs in last 24 hours: Temp:  [98 F (36.7 C)-99.7 F (37.6 C)] 98 F (36.7 C) (02/07 0400) Pulse Rate:  [46-93] 48 (02/07 0645) Resp:  [11-20] 20 (02/07 0645) BP: (80-127)/(37-115) 121/45 (02/07 0645) SpO2:  [90 %-98 %] 94 % (02/07 0645) FiO2 (%):  [70 %-100 %] 70 % (02/07 0316) Weight:  [104.3 kg (229 lb 15 oz)] 104.3 kg (229 lb 15 oz) (02/07 0500) Weight change: -2.3 kg (-1.1 oz) Last BM Date: 08/07/17  Intake/Output from previous day: 02/06 0701 - 02/07 0700 In: 801.5 [I.V.:301.5; NG/GT:100; IV Piggyback:400] Out: 1695 [Urine:1695]  PHYSICAL EXAM General appearance: Intubated sedated on mechanical ventilation Resp: rhonchi bilaterally Cardio: Normal heart sounds.  She is bradycardic GI: soft, non-tender; bowel sounds normal; no masses,  no organomegaly Extremities: extremities normal, atraumatic, no cyanosis or edema She is morbidly obese  Lab Results:  Results for orders placed or performed during the hospital encounter of 08/05/17 (from the past 48 hour(s))  Glucose, capillary     Status: Abnormal   Collection Time: 08/08/17  8:14 AM  Result Value Ref Range   Glucose-Capillary 340 (H) 65 - 99 mg/dL  Blood gas, arterial     Status: Abnormal   Collection Time: 08/08/17 10:25 AM  Result Value Ref Range   FIO2 60.00    Delivery systems BILEVEL POSITIVE AIRWAY PRESSURE    Mode BILEVEL POSITIVE AIRWAY PRESSURE    LHR 10 resp/min   Peep/cpap 5.0 cm H20   Pressure support 10 cm H20   pH, Arterial 7.307 (L) 7.350 - 7.450   pCO2 arterial 58.4 (H) 32.0 - 48.0 mmHg    Comment: CRITICAL RESULT CALLED TO, READ BACK BY AND VERIFIED WITH: Loni Muse, RN BY Lois Huxley, RRT ON 08/08/17 AT 1039.    pO2, Arterial 62.4 (L) 83.0 - 108.0 mmHg   Bicarbonate 25.6 20.0 - 28.0 mmol/L   Acid-Base Excess 2.6 (H) 0.0 - 2.0 mmol/L   O2 Saturation 88.8 %   Patient temperature 37.0    Collection site LEFT RADIAL    Drawn by [id number]    Sample type ARTERIAL DRAW    Allens test (pass/fail) PASS PASS    Comment: Performed at [facility], [address], [place], [place] [address]  Glucose, capillary     Status: Abnormal   Collection Time: 08/08/17 10:32 AM  Result Value Ref Range   Glucose-Capillary 380 (H) 65 - 99 mg/dL  Glucose, capillary     Status: Abnormal   Collection Time: 08/08/17 12:09 PM  Result Value Ref Range   Glucose-Capillary 394 (H) 65 - 99 mg/dL  Glucose, capillary     Status: Abnormal   Collection Time: 08/08/17  4:37 PM  Result Value Ref Range   Glucose-Capillary 151 (H) 65 - 99 mg/dL   Comment 1 Notify RN    Comment 2 Document in Chart   Glucose, capillary     Status: Abnormal   Collection Time: 08/08/17  9:39 PM  Result Value Ref Range   Glucose-Capillary 404 (H) 65 - 99 mg/dL  Blood gas, arterial     Status: None   Collection Time: 08/08/17 11:30 PM  Result Value Ref  Range   FIO2 70.00    Delivery systems BILEVEL POSITIVE AIRWAY PRESSURE    LHR 10 resp/min   Peep/cpap 7.0 cm H20   Inspiratory PAP 17    Expiratory PAP 7    pH, Arterial 7.366 7.350 - 7.450   pCO2 arterial 47.3 32.0 - 48.0 mmHg   pO2, Arterial 84.2 83.0 - 108.0 mmHg   Bicarbonate 25.4 20.0 - 28.0 mmol/L   Acid-Base Excess 1.6 0.0 - 2.0 mmol/L   O2 Saturation 95.9 %   Collection site LEFT RADIAL    Drawn by [id number]    Sample type ARTERIAL    Allens test (pass/fail) PASS PASS    Comment: Performed at [facility], [address], [place], [place] [address]  Glucose, capillary     Status: Abnormal   Collection Time: 08/09/17  8:10 AM  Result Value Ref Range   Glucose-Capillary 268 (H) 65 - 99 mg/dL  Glucose, capillary     Status: Abnormal   Collection Time:  08/09/17 11:07 AM  Result Value Ref Range   Glucose-Capillary 277 (H) 65 - 99 mg/dL  CBC     Status: Abnormal   Collection Time: 08/09/17 11:28 AM  Result Value Ref Range   WBC 6.4 4.0 - 10.5 K/uL   RBC 3.60 (L) 3.87 - 5.11 MIL/uL   Hemoglobin 11.4 (L) 12.0 - 15.0 g/dL   HCT 37.2 36.0 - 46.0 %   MCV 103.3 (H) 78.0 - 100.0 fL   MCH 31.7 26.0 - 34.0 pg   MCHC 30.6 30.0 - 36.0 g/dL   RDW 15.0 11.5 - 15.5 %   Platelets 158 150 - 400 K/uL    Comment: Performed at [facility], [address], [place], [place] [address]  Comprehensive metabolic panel     Status: Abnormal   Collection Time: 08/09/17 11:28 AM  Result Value Ref Range   Sodium 140 135 - 145 mmol/L   Potassium 3.6 3.5 - 5.1 mmol/L   Chloride 95 (L) 101 - 111 mmol/L   CO2 31 22 - 32 mmol/L   Glucose, Bld 282 (H) 65 - 99 mg/dL   BUN 21 (H) 6 - 20 mg/dL   Creatinine, Ser 0.95 0.44 - 1.00 mg/dL   Calcium 8.9 8.9 - 10.3 mg/dL   Total Protein 6.8 6.5 - 8.1 g/dL   Albumin 3.0 (L) 3.5 - 5.0 g/dL   AST 38 15 - 41 U/L   ALT 21 14 - 54 U/L   Alkaline Phosphatase 50 38 - 126 U/L   Total Bilirubin 1.0 0.3 - 1.2 mg/dL   GFR calc non Af Amer >60 >60 mL/min   GFR calc Af Amer >60 >60 mL/min    Comment: (NOTE) The eGFR has been calculated using the CKD EPI equation. This calculation has not been validated in all clinical situations. eGFR's persistently <60 mL/min signify possible Chronic Kidney Disease.    Anion gap 14 5 - 15    Comment: Performed at [facility], [address], [place], [place] [address]  Triglycerides     Status: None   Collection Time: 08/09/17 11:28 AM  Result Value Ref Range   Triglycerides 131 <150 mg/dL    Comment: Performed at [facility], [address] [place], [place] [address]  Draw ABG 1 hour after initiation of ventilator     Status: Abnormal   Collection Time: 08/09/17 12:20 PM  Result Value Ref Range   FIO2 0.70    Delivery systems VENTILATOR  Mode PRESSURE REGULATED VOLUME CONTROL    VT  520 mL   LHR 20 resp/min   Peep/cpap 5.0 cm H20   pH, Arterial 7.499 (H) 7.350 - 7.450   pCO2 arterial 44.3 32.0 - 48.0 mmHg   pO2, Arterial 59.7 (L) 83.0 - 108.0 mmHg   Bicarbonate 33.6 (H) 20.0 - 28.0 mmol/L   Acid-Base Excess 10.3 (H) 0.0 - 2.0 mmol/L   O2 Saturation 92.1 %   Patient temperature 37.0    Collection site LEFT RADIAL    Drawn by [id number]    Sample type ARTERIAL DRAW    Allens test (pass/fail) PASS PASS    Comment: Performed at [facility], [address] [place], [place] [address]  Glucose, capillary     Status: Abnormal   Collection Time: 08/09/17  4:33 PM  Result Value Ref Range   Glucose-Capillary 303 (H) 65 - 99 mg/dL  Glucose, capillary     Status: Abnormal   Collection Time: 08/09/17  8:11 PM  Result Value Ref Range   Glucose-Capillary 462 (H) 65 - 99 mg/dL  Glucose, capillary     Status: Abnormal   Collection Time: 08/09/17 11:40 PM  Result Value Ref Range   Glucose-Capillary 422 (H) 65 - 99 mg/dL   Comment 1 Notify RN   CBC     Status: Abnormal   Collection Time: 08/10/17  3:57 AM  Result Value Ref Range   WBC 4.5 4.0 - 10.5 K/uL   RBC 3.47 (L) 3.87 - 5.11 MIL/uL   Hemoglobin 11.0 (L) 12.0 - 15.0 g/dL   HCT 35.3 (L) 36.0 - 46.0 %   MCV 101.7 (H) 78.0 - 100.0 fL   MCH 31.7 26.0 - 34.0 pg   MCHC 31.2 30.0 - 36.0 g/dL   RDW 14.8 11.5 - 15.5 %   Platelets 166 150 - 400 K/uL    Comment: Performed at [facility], [address], [place], [place] [address]  Comprehensive metabolic panel     Status: Abnormal   Collection Time: 08/10/17  3:57 AM  Result Value Ref Range   Sodium 140 135 - 145 mmol/L   Potassium 3.6 3.5 - 5.1 mmol/L   Chloride 96 (L) 101 - 111 mmol/L   CO2 31 22 - 32 mmol/L   Glucose, Bld 391 (H) 65 - 99 mg/dL   BUN 35 (H) 6 - 20 mg/dL   Creatinine, Ser 0.90 0.44 - 1.00 mg/dL   Calcium 8.8 (L) 8.9 - 10.3 mg/dL   Total Protein 6.5 6.5 - 8.1 g/dL   Albumin 2.8 (L) 3.5 - 5.0 g/dL   AST 31 15 - 41 U/L   ALT 21 14 - 54 U/L   Alkaline  Phosphatase 47 38 - 126 U/L   Total Bilirubin 0.9 0.3 - 1.2 mg/dL   GFR calc non Af Amer >60 >60 mL/min   GFR calc Af Amer >60 >60 mL/min    Comment: (NOTE) The eGFR has been calculated using the CKD EPI equation. This calculation has not been validated in all clinical situations. eGFR's persistently <60 mL/min signify possible Chronic Kidney Disease.    Anion gap 13 5 - 15    Comment: Performed at [facility], [address], [place], [place] [address]  Glucose, capillary     Status: Abnormal   Collection Time: 08/10/17  3:58 AM  Result Value Ref Range   Glucose-Capillary 390 (H) 65 - 99 mg/dL   Comment 1 Notify RN     ABGS  Recent Labs    08/09/17 1220  PHART 7.499*  PO2ART 59.7*  HCO3 33.6*   CULTURES Recent Results (from the past 240 hour(s))  Culture, blood (routine x 2) Call MD if unable to obtain prior to antibiotics being given     Status: None   Collection Time: 08/05/17 12:02 PM  Result Value Ref Range Status   Specimen Description LEFT ANTECUBITAL  Final   Special Requests   Final    BOTTLES DRAWN AEROBIC AND ANAEROBIC Blood Culture adequate volume   Culture   Final    NO GROWTH 5 DAYS Performed at [facility], [address] [place], [place] [address]    Report Status 08/10/2017 FINAL  Final  Respiratory Panel by PCR     Status: None   Collection Time: 08/05/17 12:37 PM  Result Value Ref Range Status   Adenovirus NOT DETECTED NOT DETECTED Final   Coronavirus 229E NOT DETECTED NOT DETECTED Final   Coronavirus HKU1 NOT DETECTED NOT DETECTED Final   Coronavirus NL63 NOT DETECTED NOT DETECTED Final   Coronavirus OC43 NOT DETECTED NOT DETECTED Final   Metapneumovirus NOT DETECTED NOT DETECTED Final   Rhinovirus / Enterovirus NOT DETECTED NOT DETECTED Final   Influenza A NOT DETECTED NOT DETECTED Final   Influenza B NOT DETECTED NOT DETECTED Final   Parainfluenza Virus 1 NOT DETECTED NOT DETECTED Final   Parainfluenza Virus 2 NOT DETECTED NOT DETECTED Final    Parainfluenza Virus 3 NOT DETECTED NOT DETECTED Final   Parainfluenza Virus 4 NOT DETECTED NOT DETECTED Final   Respiratory Syncytial Virus NOT DETECTED NOT DETECTED Final   Bordetella pertussis NOT DETECTED NOT DETECTED Final   Chlamydophila pneumoniae NOT DETECTED NOT DETECTED Final   Mycoplasma pneumoniae NOT DETECTED NOT DETECTED Final    Comment: Performed at [facility], [place] [address], [place], [place] [address]   Studies/Results: Portable Chest Xray  Result Date: 08/10/2017 CLINICAL DATA:  Endotracheal tube EXAM: PORTABLE CHEST 1 VIEW COMPARISON:  08/09/2017 FINDINGS: Endotracheal tube 3 cm above the carina.  NG tube in the stomach Diffuse bilateral airspace disease asymmetric on the left shows mild interval improvement. No significant effusion. IMPRESSION: Endotracheal tube 3 cm above the carina Severe diffuse bilateral airspace disease with mild interval improvement from yesterday. Electronically Signed   By: Franchot Gallo M.D.   On: 08/10/2017 06:51   Portable Chest X-ray  Result Date: 08/09/2017 CLINICAL DATA:  Endotracheal tube placement. EXAM: PORTABLE CHEST 1 VIEW COMPARISON:  Radiograph of August 08, 2017. FINDINGS: Mild cardiomegaly is noted with central pulmonary vascular congestion. Endotracheal tube is seen projected over tracheal air shadow with distal tip 2 cm above the carina. Nasogastric tube is seen passing through the esophagus into the stomach. Stable bilateral lung opacities are noted, left greater than right, most consistent with pneumonia or possibly edema. No pneumothorax is noted. Probable left pleural effusion cannot be excluded. Bony thorax is unremarkable. IMPRESSION: Endotracheal and nasogastric tubes are in grossly good position. Stable bilateral lung opacities are noted most consistent with pneumonia or possibly edema. Left pleural effusion cannot be excluded. Electronically Signed   By: Marijo Conception, M.D.   On: 08/09/2017 09:28   Dg Chest Port 1  View  Result Date: 08/08/2017 CLINICAL DATA:  Respiratory failure. EXAM: PORTABLE CHEST 1 VIEW COMPARISON:  August 05, 2017 FINDINGS: There has been progression of airspace consolidation on the left. There is now airspace consolidation throughout most of the left lung. There is a small  left pleural effusion. The right lung is clear. There is cardiomegaly with pulmonary venous hypertension. There is aortic atherosclerosis. No adenopathy. No evident bone lesions. IMPRESSION: Consolidation throughout most of the left lung felt to represent widespread pneumonia, progressed from recent study. Right lung clear. Underlying pulmonary vascular congestion. There is aortic atherosclerosis. Aortic Atherosclerosis (ICD10-I70.0). Electronically Signed   By: Lowella Grip III M.D.   On: 08/08/2017 09:27    Medications:  Prior to Admission:  Medications Prior to Admission  Medication Sig Dispense Refill Last Dose  . acetaminophen (TYLENOL) 500 MG tablet Take 500 mg by mouth every 6 (six) hours as needed for moderate pain or headache.   08/05/2017 at 0630  . albuterol (PROVENTIL HFA;VENTOLIN HFA) 108 (90 Base) MCG/ACT inhaler Inhale 2 puffs into the lungs every 4 (four) hours as needed for wheezing or shortness of breath. 1 Inhaler 0 08/04/2017 at Unknown time  . amiodarone (PACERONE) 200 MG tablet Take 1 tablet (200 mg total) by mouth every morning. 90 tablet 3 08/05/2017 at Unknown time  . amoxicillin (AMOXIL) 500 MG capsule Take 1 capsule by mouth 3 (three) times daily.  0 08/05/2017 at 0700  . apixaban (ELIQUIS) 5 MG TABS tablet Take 1 tablet (5 mg total) by mouth 2 (two) times daily. 60 tablet 0 08/05/2017 at Unknown time  . Cyanocobalamin (B-12) 5000 MCG SUBL Take 1 tablet by mouth daily.  12 08/05/2017 at 0700  . dicyclomine (BENTYL) 10 MG capsule TAKE 1 TO 2 CAPSULES BY MOUTH BEFORE meals AS NEEDED 180 capsule 3 08/04/2017 at Unknown time  . DULoxetine (CYMBALTA) 30 MG capsule Take 30 mg by mouth 2 (two) times daily.    08/05/2017 at 0700  . famotidine (PEPCID) 20 MG tablet Take 20 mg by mouth at bedtime.    08/04/2017 at Unknown time  . furosemide (LASIX) 40 MG tablet TAKE 40 MG DAILY. 90 tablet 3 08/05/2017 at Unknown time  . gabapentin (NEURONTIN) 400 MG capsule Take 400-800 mg by mouth 3 (three) times daily. Take 1 capsule(461m) by mouth twice a day and take 2 capsules(8063m by mouth at bedtime  11 08/05/2017 at 0700  . glipiZIDE (GLUCOTROL) 10 MG tablet Take 10 mg by mouth 2 (two) times daily before a meal.    08/05/2017 at Unknown time  . Insulin Glargine (LANTUS SOLOSTAR) 100 UNIT/ML Solostar Pen Inject 20-50 Units into the skin at bedtime as needed. Patient takes 20 units every night at bedtime if over 178.  Takes 50 units if her blood sugar is above 300.   08/04/2017 at 2200  . levETIRAcetam (KEPPRA) 1000 MG tablet Take 1,000 mg by mouth 2 (two) times daily.  1 08/05/2017 at 0700  . levothyroxine (SYNTHROID, LEVOTHROID) 137 MCG tablet Take 137 mcg by mouth daily.   08/05/2017 at 0700  . loperamide (IMODIUM) 2 MG capsule TAKE ONE CAPSULE BY MOUTH TWICE DAILY AS NEEDED 60 capsule 3 08/04/2017 at Unknown time  . LORazepam (ATIVAN) 1 MG tablet Take 1 mg by mouth 3 (three) times daily as needed for anxiety or sleep. For anxiety   08/04/2017 at 2200  . magnesium oxide (MAG-OX) 400 MG tablet Take 400 mg by mouth daily.    08/05/2017 at Unknown time  . metaxalone (SKELAXIN) 800 MG tablet Take 800 mg by mouth 3 (three) times daily.    08/05/2017 at 0700  . metFORMIN (GLUCOPHAGE) 1000 MG tablet Take 1,000 mg by mouth 2 (two) times daily with a meal.    08/05/2017 at  0700  . Multiple Vitamin (MULTIVITAMIN WITH MINERALS) TABS tablet Take 1 tablet by mouth daily.   08/05/2017 at 0700  . nitroGLYCERIN (NITROSTAT) 0.4 MG SL tablet Place 0.4 mg under the tongue every 5 (five) minutes as needed for chest pain. Reported on 07/08/2015   Taking  . ondansetron (ZOFRAN) 4 MG tablet TAKE ONE TABLET BY MOUTH FOUR TIMES DAILY FOR NAUSEA (Patient taking  differently: TAKE ONE TABLET BY MOUTH FOUR TIMES DAILY FOR NAUSEA. Takes as needed) 120 tablet 3 Past Week at Unknown time  . pantoprazole (PROTONIX) 40 MG tablet TAKE ONE TABLET BY MOUTH TWICE DAILY BEFORE APPLY MEAL 180 tablet 3 08/05/2017 at 0700  . potassium chloride (K-DUR,KLOR-CON) 10 MEQ tablet TAKE TWO TABLETS BY MOUTH TWICE DAILY 120 tablet 11 08/05/2017 at 0700  . promethazine (PHENERGAN) 12.5 MG tablet Take 1 tablet (12.5 mg total) by mouth every 4 (four) hours as needed for nausea or vomiting. 42 tablet 0 Taking  . UNABLE TO FIND Take 1 tablet by mouth 2 (two) times daily. Medication Name: [employer] RX Multivitamin Diabetes Support  12 08/05/2017 at 0700   Scheduled: . acetylcysteine  3 mL Nebulization Q4H  . amiodarone  200 mg Oral q morning - 10a  . apixaban  5 mg Oral BID  . chlorhexidine gluconate (MEDLINE KIT)  15 mL Mouth Rinse BID  . dextromethorphan  30 mg Oral BID  . DULoxetine  30 mg Oral BID  . feeding supplement (PRO-STAT SUGAR FREE 64)  45 mL Per Tube QID  . feeding supplement (VITAL HIGH PROTEIN)  1,000 mL Per Tube Q24H  . furosemide  40 mg Intravenous Daily  . gabapentin  400 mg Oral BID  . gabapentin  800 mg Oral QHS  . insulin glargine  35 Units Subcutaneous QHS  . ipratropium-albuterol  3 mL Nebulization Q4H  . ketoconazole   Topical BID  . levETIRAcetam  1,000 mg Oral BID  . levothyroxine  137 mcg Oral QAC breakfast  . magnesium oxide  400 mg Oral Daily  . mouth rinse  15 mL Mouth Rinse QID  . methylPREDNISolone (SOLU-MEDROL) injection  40 mg Intravenous Q12H  . multivitamin with minerals  1 tablet Oral Daily  . nystatin   Topical TID  . pantoprazole (PROTONIX) IV  40 mg Intravenous Q24H  . potassium chloride  20 mEq Oral Daily   Continuous: . ceFEPime (MAXIPIME) IV Stopped (08/10/17 0027)  . fentaNYL infusion INTRAVENOUS 175 mcg/hr (08/09/17 2145)  . midazolam (VERSED) infusion 4 mg/hr (08/09/17 2354)  . vancomycin Stopped (08/10/17 0547)    YFV:[id number], albuterol, fentaNYL, fentaNYL (SUBLIMAZE) injection, haloperidol lactate, insulin aspart, loperamide, LORazepam, midazolam, nitroGLYCERIN, ondansetron (ZOFRAN) IV  Assesment: She has community-acquired pneumonia.  She was treated as an outpatient with amoxicillin and failed that.  She has extensive pneumonia on chest x-ray with infiltrates in the left and some in the right lung.  She has acute hypoxic and hypercapnic respiratory failure requiring intubation and mechanical ventilation.  She remains on the ventilator and I do not think we really have any opportunity to try to wean her now.  At baseline she has sleep apnea which complicates her situation  She has pulmonary hypertension which complicates her situation and keeping her oxygenation in good shape should help with that.  She has had paroxysmal atrial fib she is on amiodarone and she is a little bit bradycardic this morning  She has been agitated and difficult to sedate but is doing better with that now  She has chronic heart failure but seems to be mostly euvolemic now although there are some changes on chest x-ray that could represent volume overload  She has seizure disorder on Keppra. Principal Problem:   CAP Failed Outpatient Tx Active Problems:   Hypothyroidism   Hyperlipidemia   Essential hypertension   Atrial fibrillation (HCC)   Congestive heart failure (HCC)   Gastroparesis   Sleep apnea   Chronic anticoagulation   Tremor   Dementia   DM type 2 (diabetes mellitus, type 2) (HCC)   Pulmonary hypertension (HCC)   COPD exacerbation (HCC)   Acute respiratory failure with hypercapnia ([place])    Plan: Continue on the ventilator.  I do not think there is anything to add at this point.  Continue antibiotics. Continue IV steroids   LOS: 5 days   Recie Cirrincione L 08/10/2017, 7:46 AM

## 2017-08-10 NOTE — Progress Notes (Signed)
Nutrition Follow up  DOCUMENTATION CODES:   Obesity unspecified  INTERVENTION:   Increase tube feeding rate Vital High Protein to goal of 55 ml/hr (1320 ml every 24 hr) via OGT   Discontinue ProStat   Tube feeding regimen provides 1320 kcal , 116 grams of protein, and 301 ml of H2O.    NUTRITION DIAGNOSIS:   Inadequate oral intake related to inability to eat as evidenced by NPO status.   GOAL:   Provide needs based on ASPEN/SCCM guidelines  MONITOR:   Vent status, Labs, I & O's, TF tolerance  REASON FOR ASSESSMENT:   Consult Enteral/tube feeding initiation and management  ASSESSMENT:  Patient admitted on Saturday. She has pneumonia, acute respiratory failure. Chest x-ray (white out left lung) per MD.   Unable to tolerate BiPAP and was intubated/sedated this morning.  She has hx of CHF, GERD, HTN, DM -2, HLD, IBS, dementia and gastroparesis.  MV: 10.2 L/min Temp (24hrs), Avg:99.1 F (37.3 C), Min:98 F (36.7 C), Max:99.8 F (37.7 C)  Propofol:  Discontinued.  Labs and Meds reviewed. Hyperglycemia. Diuretic, PPI, mag-ox, MVI and insulin    Recent Labs  Lab 08/06/17 0646 08/07/17 0624 08/08/17 0427 08/09/17 1128 08/10/17 0357  NA 139 137 137 140 140  K 4.4 4.6 4.2 3.6 3.6  CL 97* 93* 93* 95* 96*  CO2 29 31 29 31 31   BUN 15 17 19  21* 35*  CREATININE 0.90 0.90 0.84 0.95 0.90  CALCIUM 8.8* 8.8* 8.8* 8.9 8.8*  MG 1.8 1.9 2.0  --   --   GLUCOSE 270* 280* 253* 282* 391*     Diet Order:  Diet NPO time specified  EDUCATION NEEDS:   No education needs have been identified at this time  Skin:  Skin Assessment: Reviewed RN Assessment  Last BM:  08/07/17  Height:   Ht Readings from Last 1 Encounters:  08/05/17 5\' 8"  (1.727 m)    Weight:   Wt Readings from Last 1 Encounters:  08/10/17 229 lb 15 oz (104.3 kg)    Ideal Body Weight:  64 kg  BMI:  Body mass index is 34.96 kg/m.  Estimated Nutritional Needs:   Kcal:  9024-0973   Protein:   120-130 gr  Fluid:  per MD goals  Royann Shivers MS,RD,CSG,LDN Office: [phone] Pager: [phone]

## 2017-08-10 NOTE — Progress Notes (Signed)
PROGRESS NOTE    [name]  FKC:[id number] DOB: 15-Dec-1950 DOA: 08/05/2017 PCP: Lemmie Evens, MD    Brief Narrative:  [name] a 67 y.o.femalewith multiple medical comorbidities detailed below presented to the emergency department with persistent cough and shortness of breath for the past 2 weeks that failed outpatient management with 2 courses of oral amoxicillin. Patient reports that she is on her second round of amoxicillin and continues to feel short of breath. She has nonproductive cough. She has some wheezing. She has no chest pain. The patient has no swelling in the extremities. She has a congestive heart failure that is chronic and she is taking her diuretics regularly. She denies palpitations. She presented to the ED and noted to be tachypneic. She was afebrile. A chest x-ray was obtained that did show a large left retro-cardiac opacity that was suspicious for pneumonia. EKG was unremarkable except for notation of prolonged PR interval. Her troponin was 0.07. She had a mildly elevated BNP at 189. The patient's blood sugar was elevated at 236. She was given treatment in the emergency department but remained tachypneic. She is going to be admitted for community-acquired pneumonia that has failed outpatient treatment.  Assessment & Plan:   Principal Problem:   CAP Failed Outpatient Tx Active Problems:   Hypothyroidism   Hyperlipidemia   Essential hypertension   Atrial fibrillation (HCC)   Congestive heart failure (HCC)   Gastroparesis   Sleep apnea   Chronic anticoagulation   Tremor   Dementia   DM type 2 (diabetes mellitus, type 2) (HCC)   Pulmonary hypertension (HCC)   COPD exacerbation (HCC)   Acute respiratory failure with hypercapnia ([place])   1. Acute respiratory failure with hypoxia and hypercapnia.  Related to progressive pneumonia.  She also has underlying COPD.  Patient was initially tried on BiPAP, but continued to be very short of breath  and did not have any significant improvement.  She required intubation with mechanical ventilation on 2/6.  Pulmonology following.  Still has a significant oxygen requirement at 70% FiO2.  Not ready to wean at this time. 2. Community-acquired pneumonia.  Initially started on ceftriaxone and azithromycin.  Chest x-ray showed progressive worsening of pneumonia.  Antibiotics changed to vancomycin and cefepime.  There may be a component of a mucous plug.  On Mucomyst nebulizer treatments.  Mild improvement on follow-up chest x-ray from 2/7.  MRSA PCR ordered.  If negative can discontinue vancomycin. 3. COPD.  No wheezing at this time.  Continue on bronchodilators as well as intravenous steroids. 4. Chronic diastolic heart failure.  Appears compensated at this time.  Currently on oral Lasix.  Monitor renal function. 5. Hypertension.  Blood pressures currently stable 6. Type 2 diabetes.  On basal insulin as well as sliding scale.  Blood sugars continue to run high.  Lantus dose increased.  Sliding scale changed to resistant coverage. 7. Paroxysmal atrial fibrillation.  She was having episodes of bradycardia overnight.  Heart rate is currently controlled on amiodarone.  Anticoagulated with Eliquis. 8. Hypothyroidism.  Continue on Synthroid 9. Seizure disorder.  No evidence of seizures. Continue on Keppra   DVT prophylaxis: apixiban Code Status: full code Family Communication: 2/7 discussed with daughters at the bedside Disposition Plan: pending hospital course, may need placement   Consultants:   pulmonology  Procedures:   Intubation 2/6>  Antimicrobials:   Vancomycin 2/5>  Cefepime 2/5>  Ceftriaxone 2/2>2/5  Azithromycin 2/2>2/5   Subjective: Resting in bed, intubated, does not respond  to voice  Objective: Vitals:   08/10/17 0630 08/10/17 0645 08/10/17 0801 08/10/17 0830  BP: (!) 118/46 (!) 121/45    Pulse: (!) 47 (!) 48    Resp: 20 20    Temp:   98.9 F (37.2 C)   TempSrc:       SpO2: 93% 94%  94%  Weight:      Height:        Intake/Output Summary (Last 24 hours) at 08/10/2017 0959 Last data filed at 08/10/2017 0500 Gross per 24 hour  Intake 801.49 ml  Output 1695 ml  Net -893.51 ml   Filed Weights   08/08/17 0500 08/09/17 0500 08/10/17 0500  Weight: 106.6 kg (235 lb 0.2 oz) 106.6 kg (235 lb 0.2 oz) 104.3 kg (229 lb 15 oz)    Examination:  General exam: intubated and sedated Respiratory system: Clear to auscultation. Respiratory effort normal. Cardiovascular system: RRR. No murmurs, rubs, gallops. Gastrointestinal system: Abdomen is nondistended, soft and nontender. No organomegaly or masses felt. Normal bowel sounds heard. Central nervous system: sedated. No focal neurological deficits. Extremities: RLE swelling (chronic), no LLE edema Skin: No rashes, lesions or ulcers Psychiatry: sedated      Data Reviewed: I have personally reviewed following labs and imaging studies  CBC: Recent Labs  Lab 08/05/17 0847 08/09/17 1128 08/10/17 0357  WBC 6.8 6.4 4.5  HGB 12.5 11.4* 11.0*  HCT 40.5 37.2 35.3*  MCV 102.0* 103.3* 101.7*  PLT 174 158 130   Basic Metabolic Panel: Recent Labs  Lab 08/06/17 0646 08/07/17 0624 08/08/17 0427 08/09/17 1128 08/10/17 0357  NA 139 137 137 140 140  K 4.4 4.6 4.2 3.6 3.6  CL 97* 93* 93* 95* 96*  CO2 _0 GLUCOSE 270* 280* 253* 282* 391*  BUN _1 21* 35*  CREATININE 0.90 0.90 0.84 0.95 0.90  CALCIUM 8.8* 8.8* 8.8* 8.9 8.8*  MG 1.8 1.9 2.0  --   --    GFR: Estimated Creatinine Clearance: 75.7 mL/min (by C-G formula based on SCr of 0.9 mg/dL). Liver Function Tests: Recent Labs  Lab 08/06/17 0646 08/07/17 0624 08/08/17 0427 08/09/17 1128 08/10/17 0357  AST _2 38 31  ALT _3 ALKPHOS 59 52 52 50 47  BILITOT 0.6 0.5 1.2 1.0 0.9  PROT 7.1 7.1 7.3 6.8 6.5  ALBUMIN 3.5 3.5 3.5 3.0* 2.8*   No results for input(s): LIPASE, AMYLASE in the last 168 hours. No results for  input(s): AMMONIA in the last 168 hours. Coagulation Profile: No results for input(s): INR, PROTIME in the last 168 hours. Cardiac Enzymes: No results for input(s): CKTOTAL, CKMB, CKMBINDEX, TROPONINI in the last 168 hours. BNP (last 3 results) No results for input(s): PROBNP in the last 8760 hours. HbA1C: No results for input(s): HGBA1C in the last 72 hours. CBG: Recent Labs  Lab 08/09/17 1633 08/09/17 2011 08/09/17 2340 08/10/17 0358 08/10/17 0746  GLUCAP 303* 462* 422* 390* 399*   Lipid Profile: Recent Labs    08/09/17 1128  TRIG 131   Thyroid Function Tests: No results for input(s): TSH, T4TOTAL, FREET4, T3FREE, THYROIDAB in the last 72 hours. Anemia Panel: No results for input(s): VITAMINB12, FOLATE, FERRITIN, TIBC, IRON, RETICCTPCT in the last 72 hours. Sepsis Labs: No results for input(s): PROCALCITON, LATICACIDVEN in the last 168 hours.  Recent Results (from the past 240 hour(s))  Culture, blood (routine x 2) Call MD if unable to obtain prior to  antibiotics being given     Status: None   Collection Time: 08/05/17 12:02 PM  Result Value Ref Range Status   Specimen Description LEFT ANTECUBITAL  Final   Special Requests   Final    BOTTLES DRAWN AEROBIC AND ANAEROBIC Blood Culture adequate volume   Culture   Final    NO GROWTH 5 DAYS Performed at [facility], [address], [place], [place] [address]    Report Status 08/10/2017 FINAL  Final  Respiratory Panel by PCR     Status: None   Collection Time: 08/05/17 12:37 PM  Result Value Ref Range Status   Adenovirus NOT DETECTED NOT DETECTED Final   Coronavirus 229E NOT DETECTED NOT DETECTED Final   Coronavirus HKU1 NOT DETECTED NOT DETECTED Final   Coronavirus NL63 NOT DETECTED NOT DETECTED Final   Coronavirus OC43 NOT DETECTED NOT DETECTED Final   Metapneumovirus NOT DETECTED NOT DETECTED Final   Rhinovirus / Enterovirus NOT DETECTED NOT DETECTED Final   Influenza A NOT DETECTED NOT DETECTED Final   Influenza  B NOT DETECTED NOT DETECTED Final   Parainfluenza Virus 1 NOT DETECTED NOT DETECTED Final   Parainfluenza Virus 2 NOT DETECTED NOT DETECTED Final   Parainfluenza Virus 3 NOT DETECTED NOT DETECTED Final   Parainfluenza Virus 4 NOT DETECTED NOT DETECTED Final   Respiratory Syncytial Virus NOT DETECTED NOT DETECTED Final   Bordetella pertussis NOT DETECTED NOT DETECTED Final   Chlamydophila pneumoniae NOT DETECTED NOT DETECTED Final   Mycoplasma pneumoniae NOT DETECTED NOT DETECTED Final    Comment: Performed at [facility], [place] [address], [place], [place] [address]         Radiology Studies: Portable Chest Xray  Result Date: 08/10/2017 CLINICAL DATA:  Endotracheal tube EXAM: PORTABLE CHEST 1 VIEW COMPARISON:  08/09/2017 FINDINGS: Endotracheal tube 3 cm above the carina.  NG tube in the stomach Diffuse bilateral airspace disease asymmetric on the left shows mild interval improvement. No significant effusion. IMPRESSION: Endotracheal tube 3 cm above the carina Severe diffuse bilateral airspace disease with mild interval improvement from yesterday. Electronically Signed   By: Franchot Gallo M.D.   On: 08/10/2017 06:51   Portable Chest X-ray  Result Date: 08/09/2017 CLINICAL DATA:  Endotracheal tube placement. EXAM: PORTABLE CHEST 1 VIEW COMPARISON:  Radiograph of August 08, 2017. FINDINGS: Mild cardiomegaly is noted with central pulmonary vascular congestion. Endotracheal tube is seen projected over tracheal air shadow with distal tip 2 cm above the carina. Nasogastric tube is seen passing through the esophagus into the stomach. Stable bilateral lung opacities are noted, left greater than right, most consistent with pneumonia or possibly edema. No pneumothorax is noted. Probable left pleural effusion cannot be excluded. Bony thorax is unremarkable. IMPRESSION: Endotracheal and nasogastric tubes are in grossly good position. Stable bilateral lung opacities are noted most consistent with  pneumonia or possibly edema. Left pleural effusion cannot be excluded. Electronically Signed   By: Marijo Conception, M.D.   On: 08/09/2017 09:28        Scheduled Meds: . acetylcysteine  3 mL Nebulization Q4H  . amiodarone  200 mg Oral q morning - 10a  . apixaban  5 mg Oral BID  . chlorhexidine gluconate (MEDLINE KIT)  15 mL Mouth Rinse BID  . dextromethorphan  30 mg Oral BID  . DULoxetine  30 mg Oral BID  . feeding supplement (PRO-STAT SUGAR FREE 64)  45 mL Per Tube QID  . feeding supplement (VITAL HIGH PROTEIN)  1,000 mL Per Tube Q24H  . furosemide  40 mg Intravenous Daily  . gabapentin  400 mg Oral BID  . gabapentin  800 mg Oral QHS  . insulin aspart  0-20 Units Subcutaneous Q4H  . insulin glargine  45 Units Subcutaneous QHS  . ipratropium-albuterol  3 mL Nebulization Q4H  . ketoconazole   Topical BID  . levETIRAcetam  1,000 mg Oral BID  . levothyroxine  137 mcg Oral QAC breakfast  . magnesium oxide  400 mg Oral Daily  . mouth rinse  15 mL Mouth Rinse QID  . methylPREDNISolone (SOLU-MEDROL) injection  40 mg Intravenous Q12H  . multivitamin with minerals  1 tablet Oral Daily  . nystatin   Topical TID  . pantoprazole (PROTONIX) IV  40 mg Intravenous Q24H  . potassium chloride  20 mEq Oral Daily   Continuous Infusions: . ceFEPime (MAXIPIME) IV 1 g (08/10/17 0851)  . fentaNYL infusion INTRAVENOUS 175 mcg/hr (08/10/17 0837)  . midazolam (VERSED) infusion 4 mg/hr (08/09/17 2354)  . vancomycin Stopped (08/10/17 0547)     LOS: 5 days    Time spent: critical care time: 15mns    JKathie Dike MD [facility] Pager [phone] If 7PM-7AM, please contact night-coverage [email or web address] Password [email or web address] 08/10/2017, 9:59 AM

## 2017-08-11 ENCOUNTER — Inpatient Hospital Stay (HOSPITAL_COMMUNITY): Payer: Medicare Other

## 2017-08-11 ENCOUNTER — Ambulatory Visit: Payer: Medicare Other | Admitting: Gastroenterology

## 2017-08-11 DIAGNOSIS — J181 Lobar pneumonia, unspecified organism: Principal | ICD-10-CM

## 2017-08-11 LAB — GLUCOSE, CAPILLARY
GLUCOSE-CAPILLARY: 242 mg/dL — AB (ref 65–99)
GLUCOSE-CAPILLARY: 332 mg/dL — AB (ref 65–99)
Glucose-Capillary: 275 mg/dL — ABNORMAL HIGH (ref 65–99)
Glucose-Capillary: 293 mg/dL — ABNORMAL HIGH (ref 65–99)
Glucose-Capillary: 303 mg/dL — ABNORMAL HIGH (ref 65–99)
Glucose-Capillary: 307 mg/dL — ABNORMAL HIGH (ref 65–99)

## 2017-08-11 LAB — BLOOD GAS, ARTERIAL
ACID-BASE EXCESS: 9.2 mmol/L — AB (ref 0.0–2.0)
BICARBONATE: 32.9 mmol/L — AB (ref 20.0–28.0)
DRAWN BY: 221791
FIO2: 70
LHR: 20 {breaths}/min
O2 Saturation: 94.5 %
PEEP/CPAP: 5 cmH2O
PH ART: 7.529 — AB (ref 7.350–7.450)
VT: 520 mL
pCO2 arterial: 39.3 mmHg (ref 32.0–48.0)
pO2, Arterial: 71 mmHg — ABNORMAL LOW (ref 83.0–108.0)

## 2017-08-11 LAB — COMPREHENSIVE METABOLIC PANEL
ALBUMIN: 2.6 g/dL — AB (ref 3.5–5.0)
ALK PHOS: 49 U/L (ref 38–126)
ALT: 20 U/L (ref 14–54)
ANION GAP: 11 (ref 5–15)
AST: 34 U/L (ref 15–41)
BILIRUBIN TOTAL: 0.7 mg/dL (ref 0.3–1.2)
BUN: 43 mg/dL — ABNORMAL HIGH (ref 6–20)
CALCIUM: 8.9 mg/dL (ref 8.9–10.3)
CO2: 31 mmol/L (ref 22–32)
Chloride: 97 mmol/L — ABNORMAL LOW (ref 101–111)
Creatinine, Ser: 1 mg/dL (ref 0.44–1.00)
GFR calc non Af Amer: 57 mL/min — ABNORMAL LOW (ref >60)
GLUCOSE: 300 mg/dL — AB (ref 65–99)
POTASSIUM: 3.4 mmol/L — AB (ref 3.5–5.1)
SODIUM: 139 mmol/L (ref 135–145)
TOTAL PROTEIN: 6.6 g/dL (ref 6.5–8.1)

## 2017-08-11 LAB — CBC
HEMATOCRIT: 35.9 % — AB (ref 36.0–46.0)
Hemoglobin: 11 g/dL — ABNORMAL LOW (ref 12.0–15.0)
MCH: 31 pg (ref 26.0–34.0)
MCHC: 30.6 g/dL (ref 30.0–36.0)
MCV: 101.1 fL — ABNORMAL HIGH (ref 78.0–100.0)
Platelets: 186 10*3/uL (ref 150–400)
RBC: 3.55 MIL/uL — ABNORMAL LOW (ref 3.87–5.11)
RDW: 14.9 % (ref 11.5–15.5)
WBC: 7.5 10*3/uL (ref 4.0–10.5)

## 2017-08-11 LAB — MRSA PCR SCREENING: MRSA BY PCR: NEGATIVE

## 2017-08-11 MED ORDER — VITAL HIGH PROTEIN PO LIQD
1000.0000 mL | ORAL | Status: DC
  Administered 2017-08-11: 1000 mL
  Filled 2017-08-11 (×3): qty 1000

## 2017-08-11 MED ORDER — INSULIN GLARGINE 100 UNIT/ML ~~LOC~~ SOLN
55.0000 [IU] | Freq: Every day | SUBCUTANEOUS | Status: DC
  Administered 2017-08-11 – 2017-08-21 (×11): 55 [IU] via SUBCUTANEOUS
  Filled 2017-08-11 (×12): qty 0.55

## 2017-08-11 MED ORDER — VITAL HIGH PROTEIN PO LIQD
1000.0000 mL | ORAL | Status: DC
  Administered 2017-08-11 – 2017-08-20 (×9): 1000 mL
  Filled 2017-08-11 (×16): qty 1000

## 2017-08-11 NOTE — Progress Notes (Signed)
Inpatient Diabetes Program Recommendations  AACE/ADA: New Consensus Statement on Inpatient Glycemic Control (2015)  Target Ranges:  Prepandial:   less than 140 mg/dL      Peak postprandial:   less than 180 mg/dL (1-2 hours)      Critically ill patients:  140 - 180 mg/dL   Lab Results  Component Value Date   GLUCAP 307 (H) 08/11/2017   HGBA1C 7.4 (H) 02/04/2015    Review of Glycemic ControlResults for KIARA, WELCOME (MRN [id number]) as of 08/11/2017 09:54  Ref. Range 08/10/2017 16:13 08/10/2017 20:01 08/10/2017 23:48 08/11/2017 04:42 08/11/2017 08:17  Glucose-Capillary Latest Ref Range: 65 - 99 mg/dL 952 (H) 841 (H) 324 (H) 275 (H) 307 (H)   Diabetes history: Type 2 DM Outpatient Diabetes medications: Lantus 20 units QHS if glucose >178 mg/dl (or takes 50 units if glucose >300 mg/dl), Glipizide 10 mg BID, Metformin 1000 mg BID Current orders for Inpatient glycemic control:Lantus45units QHS,Novolog 0-20 unitsQ4H; Solumedrol 40 mg Q12H, Vital 55 ml/hr Inpatient Diabetes Program Recommendations:  Blood sugars remain elevated.  Please consider IV insulin/Glycemic control order set.   Thanks,  Beryl Meager, RN, BC-ADM Inpatient Diabetes Coordinator Pager [phone] (8a-5p)

## 2017-08-11 NOTE — Progress Notes (Signed)
Subjective: She remains intubated and on mechanical vent.  No new issues overnight.  Objective: Vital signs in last 24 hours: Temp:  [98.8 F (37.1 C)-99.8 F (37.7 C)] 99.2 F (37.3 C) (02/08 0400) Pulse Rate:  [45-57] 47 (02/08 0600) Resp:  [15-20] 20 (02/08 0600) BP: (88-131)/(42-114) 110/52 (02/08 0600) SpO2:  [92 %-96 %] 93 % (02/08 0600) FiO2 (%):  [70 %] 70 % (02/08 0330) Weight:  [105 kg (231 lb 7.7 oz)] 105 kg (231 lb 7.7 oz) (02/08 0500) Weight change: 0.7 kg (1 lb 8.7 oz) Last BM Date: 08/10/17  Intake/Output from previous day: 02/07 0701 - 02/08 0700 In: 1536.3 [I.V.:578.4; NG/GT:407.9; IV Piggyback:550] Out: 1925 [CNOBS:9628]  PHYSICAL EXAM General appearance: Intubated sedated on mechanical ventilation Resp: rales bilaterally, rhonchi bilaterally and wheezes bilaterally Cardio: Heart is regular with bradycardia at about 45-50 GI: soft, non-tender; bowel sounds normal; no masses,  no organomegaly Extremities: extremities normal, atraumatic, no cyanosis or edema  Lab Results:  Results for orders placed or performed during the hospital encounter of 08/05/17 (from the past 48 hour(s))  Glucose, capillary     Status: Abnormal   Collection Time: 08/09/17  8:10 AM  Result Value Ref Range   Glucose-Capillary 268 (H) 65 - 99 mg/dL  Glucose, capillary     Status: Abnormal   Collection Time: 08/09/17 11:07 AM  Result Value Ref Range   Glucose-Capillary 277 (H) 65 - 99 mg/dL  CBC     Status: Abnormal   Collection Time: 08/09/17 11:28 AM  Result Value Ref Range   WBC 6.4 4.0 - 10.5 K/uL   RBC 3.60 (L) 3.87 - 5.11 MIL/uL   Hemoglobin 11.4 (L) 12.0 - 15.0 g/dL   HCT 37.2 36.0 - 46.0 %   MCV 103.3 (H) 78.0 - 100.0 fL   MCH 31.7 26.0 - 34.0 pg   MCHC 30.6 30.0 - 36.0 g/dL   RDW 15.0 11.5 - 15.5 %   Platelets 158 150 - 400 K/uL    Comment: Performed at [facility], [address] [place], [place] [address]  Comprehensive metabolic panel     Status: Abnormal    Collection Time: 08/09/17 11:28 AM  Result Value Ref Range   Sodium 140 135 - 145 mmol/L   Potassium 3.6 3.5 - 5.1 mmol/L   Chloride 95 (L) 101 - 111 mmol/L   CO2 31 22 - 32 mmol/L   Glucose, Bld 282 (H) 65 - 99 mg/dL   BUN 21 (H) 6 - 20 mg/dL   Creatinine, Ser 0.95 0.44 - 1.00 mg/dL   Calcium 8.9 8.9 - 10.3 mg/dL   Total Protein 6.8 6.5 - 8.1 g/dL   Albumin 3.0 (L) 3.5 - 5.0 g/dL   AST 38 15 - 41 U/L   ALT 21 14 - 54 U/L   Alkaline Phosphatase 50 38 - 126 U/L   Total Bilirubin 1.0 0.3 - 1.2 mg/dL   GFR calc non Af Amer >60 >60 mL/min   GFR calc Af Amer >60 >60 mL/min    Comment: (NOTE) The eGFR has been calculated using the CKD EPI equation. This calculation has not been validated in all clinical situations. eGFR's persistently <60 mL/min signify possible Chronic Kidney Disease.    Anion gap 14 5 - 15    Comment: Performed at [facility], [address] [place], [place] [address]  Triglycerides     Status: None   Collection Time: 08/09/17 11:28 AM  Result Value Ref Range  Triglycerides 131 <150 mg/dL    Comment: Performed at [facility], [address], [place], [place] [address]  Draw ABG 1 hour after initiation of ventilator     Status: Abnormal   Collection Time: 08/09/17 12:20 PM  Result Value Ref Range   FIO2 0.70    Delivery systems VENTILATOR    Mode PRESSURE REGULATED VOLUME CONTROL    VT 520 mL   LHR 20 resp/min   Peep/cpap 5.0 cm H20   pH, Arterial 7.499 (H) 7.350 - 7.450   pCO2 arterial 44.3 32.0 - 48.0 mmHg   pO2, Arterial 59.7 (L) 83.0 - 108.0 mmHg   Bicarbonate 33.6 (H) 20.0 - 28.0 mmol/L   Acid-Base Excess 10.3 (H) 0.0 - 2.0 mmol/L   O2 Saturation 92.1 %   Patient temperature 37.0    Collection site LEFT RADIAL    Drawn by [id number]    Sample type ARTERIAL DRAW    Allens test (pass/fail) PASS PASS    Comment: Performed at [facility], [address] [place], [place] [address]  Glucose, capillary     Status: Abnormal   Collection Time: 08/09/17   4:33 PM  Result Value Ref Range   Glucose-Capillary 303 (H) 65 - 99 mg/dL  Glucose, capillary     Status: Abnormal   Collection Time: 08/09/17  8:11 PM  Result Value Ref Range   Glucose-Capillary 462 (H) 65 - 99 mg/dL  Glucose, capillary     Status: Abnormal   Collection Time: 08/09/17 11:40 PM  Result Value Ref Range   Glucose-Capillary 422 (H) 65 - 99 mg/dL   Comment 1 Notify RN   CBC     Status: Abnormal   Collection Time: 08/10/17  3:57 AM  Result Value Ref Range   WBC 4.5 4.0 - 10.5 K/uL   RBC 3.47 (L) 3.87 - 5.11 MIL/uL   Hemoglobin 11.0 (L) 12.0 - 15.0 g/dL   HCT 35.3 (L) 36.0 - 46.0 %   MCV 101.7 (H) 78.0 - 100.0 fL   MCH 31.7 26.0 - 34.0 pg   MCHC 31.2 30.0 - 36.0 g/dL   RDW 14.8 11.5 - 15.5 %   Platelets 166 150 - 400 K/uL    Comment: Performed at [facility] [address], [place], [place] [address]  Comprehensive metabolic panel     Status: Abnormal   Collection Time: 08/10/17  3:57 AM  Result Value Ref Range   Sodium 140 135 - 145 mmol/L   Potassium 3.6 3.5 - 5.1 mmol/L   Chloride 96 (L) 101 - 111 mmol/L   CO2 31 22 - 32 mmol/L   Glucose, Bld 391 (H) 65 - 99 mg/dL   BUN 35 (H) 6 - 20 mg/dL   Creatinine, Ser 0.90 0.44 - 1.00 mg/dL   Calcium 8.8 (L) 8.9 - 10.3 mg/dL   Total Protein 6.5 6.5 - 8.1 g/dL   Albumin 2.8 (L) 3.5 - 5.0 g/dL   AST 31 15 - 41 U/L   ALT 21 14 - 54 U/L   Alkaline Phosphatase 47 38 - 126 U/L   Total Bilirubin 0.9 0.3 - 1.2 mg/dL   GFR calc non Af Amer >60 >60 mL/min   GFR calc Af Amer >60 >60 mL/min    Comment: (NOTE) The eGFR has been calculated using the CKD EPI equation. This calculation has not been validated in all clinical situations. eGFR's persistently <60 mL/min signify possible Chronic Kidney Disease.    Anion gap 13 5 -  15    Comment: Performed at [facility], [address], [place], [place] [address]  Glucose, capillary     Status: Abnormal   Collection Time: 08/10/17  3:58 AM  Result Value Ref Range    Glucose-Capillary 390 (H) 65 - 99 mg/dL   Comment 1 Notify RN   Glucose, capillary     Status: Abnormal   Collection Time: 08/10/17  7:46 AM  Result Value Ref Range   Glucose-Capillary 399 (H) 65 - 99 mg/dL  MRSA PCR Screening     Status: None   Collection Time: 08/10/17  8:35 AM  Result Value Ref Range   MRSA by PCR NEGATIVE NEGATIVE    Comment:        The GeneXpert MRSA Assay (FDA approved for NASAL specimens only), is one component of a comprehensive MRSA colonization surveillance program. It is not intended to diagnose MRSA infection nor to guide or monitor treatment for MRSA infections. Performed at [facility], [address], [place], [place] [address]   Glucose, capillary     Status: Abnormal   Collection Time: 08/10/17 11:26 AM  Result Value Ref Range   Glucose-Capillary 441 (H) 65 - 99 mg/dL  Glucose, capillary     Status: Abnormal   Collection Time: 08/10/17  4:13 PM  Result Value Ref Range   Glucose-Capillary 249 (H) 65 - 99 mg/dL   Comment 1 Notify RN    Comment 2 Document in Chart   Glucose, capillary     Status: Abnormal   Collection Time: 08/10/17  8:01 PM  Result Value Ref Range   Glucose-Capillary 415 (H) 65 - 99 mg/dL   Comment 1 Notify RN    Comment 2 Document in Chart   Glucose, capillary     Status: Abnormal   Collection Time: 08/10/17 11:48 PM  Result Value Ref Range   Glucose-Capillary 424 (H) 65 - 99 mg/dL   Comment 1 Notify RN   Glucose, capillary     Status: Abnormal   Collection Time: 08/11/17  4:42 AM  Result Value Ref Range   Glucose-Capillary 275 (H) 65 - 99 mg/dL  CBC     Status: Abnormal   Collection Time: 08/11/17  5:47 AM  Result Value Ref Range   WBC 7.5 4.0 - 10.5 K/uL   RBC 3.55 (L) 3.87 - 5.11 MIL/uL   Hemoglobin 11.0 (L) 12.0 - 15.0 g/dL   HCT 35.9 (L) 36.0 - 46.0 %   MCV 101.1 (H) 78.0 - 100.0 fL   MCH 31.0 26.0 - 34.0 pg   MCHC 30.6 30.0 - 36.0 g/dL   RDW 14.9 11.5 - 15.5 %   Platelets 186 150 - 400 K/uL    Comment:  Performed at [facility], [address], [place], [place] [address]  Comprehensive metabolic panel     Status: Abnormal   Collection Time: 08/11/17  5:47 AM  Result Value Ref Range   Sodium 139 135 - 145 mmol/L   Potassium 3.4 (L) 3.5 - 5.1 mmol/L   Chloride 97 (L) 101 - 111 mmol/L   CO2 31 22 - 32 mmol/L   Glucose, Bld 300 (H) 65 - 99 mg/dL   BUN 43 (H) 6 - 20 mg/dL   Creatinine, Ser 1.00 0.44 - 1.00 mg/dL   Calcium 8.9 8.9 - 10.3 mg/dL   Total Protein 6.6 6.5 - 8.1 g/dL   Albumin 2.6 (L) 3.5 - 5.0 g/dL   AST 34 15 - 41 U/L  ALT 20 14 - 54 U/L   Alkaline Phosphatase 49 38 - 126 U/L   Total Bilirubin 0.7 0.3 - 1.2 mg/dL   GFR calc non Af Amer 57 (L) >60 mL/min   GFR calc Af Amer >60 >60 mL/min    Comment: (NOTE) The eGFR has been calculated using the CKD EPI equation. This calculation has not been validated in all clinical situations. eGFR's persistently <60 mL/min signify possible Chronic Kidney Disease.    Anion gap 11 5 - 15    Comment: Performed at [facility], [address], [place], [place] [address]    ABGS Recent Labs    08/09/17 1220  PHART 7.499*  PO2ART 59.7*  HCO3 33.6*   CULTURES Recent Results (from the past 240 hour(s))  Culture, blood (routine x 2) Call MD if unable to obtain prior to antibiotics being given     Status: None   Collection Time: 08/05/17 12:02 PM  Result Value Ref Range Status   Specimen Description LEFT ANTECUBITAL  Final   Special Requests   Final    BOTTLES DRAWN AEROBIC AND ANAEROBIC Blood Culture adequate volume   Culture   Final    NO GROWTH 5 DAYS Performed at [facility], [address], [place], [place] [address]    Report Status 08/10/2017 FINAL  Final  Respiratory Panel by PCR     Status: None   Collection Time: 08/05/17 12:37 PM  Result Value Ref Range Status   Adenovirus NOT DETECTED NOT DETECTED Final   Coronavirus 229E NOT DETECTED NOT DETECTED Final   Coronavirus HKU1 NOT DETECTED NOT DETECTED Final    Coronavirus NL63 NOT DETECTED NOT DETECTED Final   Coronavirus OC43 NOT DETECTED NOT DETECTED Final   Metapneumovirus NOT DETECTED NOT DETECTED Final   Rhinovirus / Enterovirus NOT DETECTED NOT DETECTED Final   Influenza A NOT DETECTED NOT DETECTED Final   Influenza B NOT DETECTED NOT DETECTED Final   Parainfluenza Virus 1 NOT DETECTED NOT DETECTED Final   Parainfluenza Virus 2 NOT DETECTED NOT DETECTED Final   Parainfluenza Virus 3 NOT DETECTED NOT DETECTED Final   Parainfluenza Virus 4 NOT DETECTED NOT DETECTED Final   Respiratory Syncytial Virus NOT DETECTED NOT DETECTED Final   Bordetella pertussis NOT DETECTED NOT DETECTED Final   Chlamydophila pneumoniae NOT DETECTED NOT DETECTED Final   Mycoplasma pneumoniae NOT DETECTED NOT DETECTED Final    Comment: Performed at [facility], [place] [address], [place], [place] [address]  MRSA PCR Screening     Status: None   Collection Time: 08/10/17  8:35 AM  Result Value Ref Range Status   MRSA by PCR NEGATIVE NEGATIVE Final    Comment:        The GeneXpert MRSA Assay (FDA approved for NASAL specimens only), is one component of a comprehensive MRSA colonization surveillance program. It is not intended to diagnose MRSA infection nor to guide or monitor treatment for MRSA infections. Performed at [facility], [address], [place],  [address]    Studies/Results: Portable Chest Xray  Result Date: 08/10/2017 CLINICAL DATA:  Endotracheal tube EXAM: PORTABLE CHEST 1 VIEW COMPARISON:  08/09/2017 FINDINGS: Endotracheal tube 3 cm above the carina.  NG tube in the stomach Diffuse bilateral airspace disease asymmetric on the left shows mild interval improvement. No significant effusion. IMPRESSION: Endotracheal tube 3 cm above the carina Severe diffuse bilateral airspace disease with mild interval improvement from yesterday. Electronically Signed   By: Franchot Gallo M.D.  On: 08/10/2017 06:51   Portable Chest X-ray  Result Date:  08/09/2017 CLINICAL DATA:  Endotracheal tube placement. EXAM: PORTABLE CHEST 1 VIEW COMPARISON:  Radiograph of August 08, 2017. FINDINGS: Mild cardiomegaly is noted with central pulmonary vascular congestion. Endotracheal tube is seen projected over tracheal air shadow with distal tip 2 cm above the carina. Nasogastric tube is seen passing through the esophagus into the stomach. Stable bilateral lung opacities are noted, left greater than right, most consistent with pneumonia or possibly edema. No pneumothorax is noted. Probable left pleural effusion cannot be excluded. Bony thorax is unremarkable. IMPRESSION: Endotracheal and nasogastric tubes are in grossly good position. Stable bilateral lung opacities are noted most consistent with pneumonia or possibly edema. Left pleural effusion cannot be excluded. Electronically Signed   By: Marijo Conception, M.D.   On: 08/09/2017 09:28    Medications:  Prior to Admission:  Medications Prior to Admission  Medication Sig Dispense Refill Last Dose  . acetaminophen (TYLENOL) 500 MG tablet Take 500 mg by mouth every 6 (six) hours as needed for moderate pain or headache.   08/05/2017 at 0630  . albuterol (PROVENTIL HFA;VENTOLIN HFA) 108 (90 Base) MCG/ACT inhaler Inhale 2 puffs into the lungs every 4 (four) hours as needed for wheezing or shortness of breath. 1 Inhaler 0 08/04/2017 at Unknown time  . amiodarone (PACERONE) 200 MG tablet Take 1 tablet (200 mg total) by mouth every morning. 90 tablet 3 08/05/2017 at Unknown time  . amoxicillin (AMOXIL) 500 MG capsule Take 1 capsule by mouth 3 (three) times daily.  0 08/05/2017 at 0700  . apixaban (ELIQUIS) 5 MG TABS tablet Take 1 tablet (5 mg total) by mouth 2 (two) times daily. 60 tablet 0 08/05/2017 at Unknown time  . Cyanocobalamin (B-12) 5000 MCG SUBL Take 1 tablet by mouth daily.  12 08/05/2017 at 0700  . dicyclomine (BENTYL) 10 MG capsule TAKE 1 TO 2 CAPSULES BY MOUTH BEFORE meals AS NEEDED 180 capsule 3 08/04/2017 at Unknown  time  . DULoxetine (CYMBALTA) 30 MG capsule Take 30 mg by mouth 2 (two) times daily.   08/05/2017 at 0700  . famotidine (PEPCID) 20 MG tablet Take 20 mg by mouth at bedtime.    08/04/2017 at Unknown time  . furosemide (LASIX) 40 MG tablet TAKE 40 MG DAILY. 90 tablet 3 08/05/2017 at Unknown time  . gabapentin (NEURONTIN) 400 MG capsule Take 400-800 mg by mouth 3 (three) times daily. Take 1 capsule(473m) by mouth twice a day and take 2 capsules(8054m by mouth at bedtime  11 08/05/2017 at 0700  . glipiZIDE (GLUCOTROL) 10 MG tablet Take 10 mg by mouth 2 (two) times daily before a meal.    08/05/2017 at Unknown time  . Insulin Glargine (LANTUS SOLOSTAR) 100 UNIT/ML Solostar Pen Inject 20-50 Units into the skin at bedtime as needed. Patient takes 20 units every night at bedtime if over 178.  Takes 50 units if her blood sugar is above 300.   08/04/2017 at 2200  . levETIRAcetam (KEPPRA) 1000 MG tablet Take 1,000 mg by mouth 2 (two) times daily.  1 08/05/2017 at 0700  . levothyroxine (SYNTHROID, LEVOTHROID) 137 MCG tablet Take 137 mcg by mouth daily.   08/05/2017 at 0700  . loperamide (IMODIUM) 2 MG capsule TAKE ONE CAPSULE BY MOUTH TWICE DAILY AS NEEDED 60 capsule 3 08/04/2017 at Unknown time  . LORazepam (ATIVAN) 1 MG tablet Take 1 mg by mouth 3 (three) times daily as needed for anxiety or sleep.  For anxiety   08/04/2017 at 2200  . magnesium oxide (MAG-OX) 400 MG tablet Take 400 mg by mouth daily.    08/05/2017 at Unknown time  . metaxalone (SKELAXIN) 800 MG tablet Take 800 mg by mouth 3 (three) times daily.    08/05/2017 at 0700  . metFORMIN (GLUCOPHAGE) 1000 MG tablet Take 1,000 mg by mouth 2 (two) times daily with a meal.    08/05/2017 at 0700  . Multiple Vitamin (MULTIVITAMIN WITH MINERALS) TABS tablet Take 1 tablet by mouth daily.   08/05/2017 at 0700  . nitroGLYCERIN (NITROSTAT) 0.4 MG SL tablet Place 0.4 mg under the tongue every 5 (five) minutes as needed for chest pain. Reported on 07/08/2015   Taking  . ondansetron (ZOFRAN)  4 MG tablet TAKE ONE TABLET BY MOUTH FOUR TIMES DAILY FOR NAUSEA (Patient taking differently: TAKE ONE TABLET BY MOUTH FOUR TIMES DAILY FOR NAUSEA. Takes as needed) 120 tablet 3 Past Week at Unknown time  . pantoprazole (PROTONIX) 40 MG tablet TAKE ONE TABLET BY MOUTH TWICE DAILY BEFORE APPLY MEAL 180 tablet 3 08/05/2017 at 0700  . potassium chloride (K-DUR,KLOR-CON) 10 MEQ tablet TAKE TWO TABLETS BY MOUTH TWICE DAILY 120 tablet 11 08/05/2017 at 0700  . promethazine (PHENERGAN) 12.5 MG tablet Take 1 tablet (12.5 mg total) by mouth every 4 (four) hours as needed for nausea or vomiting. 42 tablet 0 Taking  . UNABLE TO FIND Take 1 tablet by mouth 2 (two) times daily. Medication Name: [employer] RX Multivitamin Diabetes Support  12 08/05/2017 at 0700   Scheduled: . acetylcysteine  3 mL Nebulization Q4H  . amiodarone  200 mg Oral q morning - 10a  . apixaban  5 mg Oral BID  . chlorhexidine gluconate (MEDLINE KIT)  15 mL Mouth Rinse BID  . dextromethorphan  30 mg Oral BID  . DULoxetine  30 mg Oral BID  . feeding supplement (VITAL HIGH PROTEIN)  1,000 mL Per Tube Q24H  . furosemide  40 mg Oral Daily  . gabapentin  400 mg Oral BID  . gabapentin  800 mg Oral QHS  . insulin aspart  0-20 Units Subcutaneous Q4H  . insulin glargine  45 Units Subcutaneous QHS  . ipratropium-albuterol  3 mL Nebulization Q4H  . ketoconazole   Topical BID  . levETIRAcetam  1,000 mg Oral BID  . levothyroxine  137 mcg Oral QAC breakfast  . magnesium oxide  400 mg Oral Daily  . mouth rinse  15 mL Mouth Rinse QID  . methylPREDNISolone (SOLU-MEDROL) injection  40 mg Intravenous Q12H  . multivitamin with minerals  1 tablet Oral Daily  . nystatin   Topical TID  . pantoprazole (PROTONIX) IV  40 mg Intravenous Q24H  . potassium chloride  20 mEq Oral Daily   Continuous: . ceFEPime (MAXIPIME) IV Stopped (08/11/17 0256)  . fentaNYL infusion INTRAVENOUS 200 mcg/hr (08/11/17 0255)  . midazolam (VERSED) infusion 4 mg/hr (08/10/17 2357)   . vancomycin 1,000 mg (08/11/17 0500)   GEX:[id number], albuterol, fentaNYL, fentaNYL (SUBLIMAZE) injection, haloperidol lactate, loperamide, midazolam, nitroGLYCERIN, ondansetron (ZOFRAN) IV  Assesment: She was admitted with community-acquired pneumonia that failed outpatient treatment.  She has diffuse bilateral pneumonia.  I initially thought that she might have a mucous plug based on the x-ray I saw before she was intubated but I think that is less likely now. Chest x-ray that I personally reviewed allowing for differences in technique I think is about the same but better than on the sixth   She has  COPD at baseline and she is being treated  She failed BiPAP and required intubation and mechanical ventilation and she is still on the ventilator.  She is at 70% oxygen so there is not much chance for weaning today.  She has history of heart failure but seems to be euvolemic  She has history of seizure disorder but no recent seizures and she is on treatment for that Principal Problem:   CAP Failed Outpatient Tx Active Problems:   Hypothyroidism   Hyperlipidemia   Essential hypertension   Atrial fibrillation (HCC)   Congestive heart failure (HCC)   Gastroparesis   Sleep apnea   Chronic anticoagulation   Tremor   Dementia   DM type 2 (diabetes mellitus, type 2) (HCC)   Pulmonary hypertension (HCC)   COPD exacerbation (HCC)   Acute respiratory failure with hypercapnia ([place])    Plan: Check blood gas.  Continue other treatments.  See if we can wean her oxygen any.  Okay to discontinue Mucomyst after 48 hours    LOS: 6 days   Rishawn Walck L 08/11/2017, 7:54 AM

## 2017-08-11 NOTE — Progress Notes (Signed)
PROGRESS NOTE    [name]  ZES:[id number] DOB: [birth year] DOA: 08/05/2017 PCP: Lemmie Evens, MD    Brief Narrative:  [name] a 67 y.o.femalewith multiple medical comorbidities detailed below presented to the emergency department with persistent cough and shortness of breath for the past 2 weeks that failed outpatient management with 2 courses of oral amoxicillin. Patient reports that she is on her second round of amoxicillin and continues to feel short of breath. She has nonproductive cough. She has some wheezing. She has no chest pain. The patient has no swelling in the extremities. She has a congestive heart failure that is chronic and she is taking her diuretics regularly. She denies palpitations. She presented to the ED and noted to be tachypneic. She was afebrile. A chest x-ray was obtained that did show a large left retro-cardiac opacity that was suspicious for pneumonia. EKG was unremarkable except for notation of prolonged PR interval. Her troponin was 0.07. She had a mildly elevated BNP at 189. The patient's blood sugar was elevated at 236. She was given treatment in the emergency department but remained tachypneic. She is going to be admitted for community-acquired pneumonia that has failed outpatient treatment.  Assessment & Plan:   Principal Problem:   CAP Failed Outpatient Tx Active Problems:   Hypothyroidism   Hyperlipidemia   Essential hypertension   Atrial fibrillation (HCC)   Congestive heart failure (HCC)   Gastroparesis   Sleep apnea   Chronic anticoagulation   Tremor   Dementia   DM type 2 (diabetes mellitus, type 2) (HCC)   Pulmonary hypertension (HCC)   COPD exacerbation (HCC)   Acute respiratory failure with hypercapnia ([place])   1. Acute respiratory failure with hypoxia and hypercapnia.  Related to progressive pneumonia.  She also has underlying COPD.  Patient was initially tried on BiPAP, but continued to be very short of breath  and did not have any significant improvement.  She required intubation with mechanical ventilation on 2/6.  Pulmonology following.  Continues to require 70% FiO2.  We will continue to wean down oxygen as tolerated.  She is not ready to undergo trial start of pressure support. 2. Community-acquired pneumonia.  Initially started on ceftriaxone and azithromycin.  Chest x-ray showed progressive worsening of pneumonia.  Antibiotics changed to vancomycin and cefepime.  Follow-up chest x-rays have shown mild improvement.  MRSA PCR is negative.  Will discontinue vancomycin.  Continue on cefepime. 3. COPD.  No wheezing at this time.  Continue on bronchodilators as well as intravenous steroids. 4. Chronic diastolic heart failure.  Appears compensated at this time.  Currently on oral Lasix.  Monitor renal function. 5. Hypertension.  Blood pressures currently stable 6. Type 2 diabetes.  On basal insulin as well as sliding scale.  Blood sugars remain high.  Increase Lantus to 55 units nightly.  Continue on sliding scale. 7. Paroxysmal atrial fibrillation.  Currently on amiodarone.  Heart rate is stable.  Anticoagulated with Eliquis. 8. Hypothyroidism.  Stable.  Continue on Synthroid 9. Seizure disorder.  Stable.  Continue on Keppra   DVT prophylaxis: apixiban Code Status: full code Family Communication: 2/8 discussed with daughters at the bedside Disposition Plan: pending hospital course, may need placement   Consultants:   pulmonology  Procedures:   Intubation 2/6>  Antimicrobials:   Vancomycin 2/5> 2/8  Cefepime 2/5>  Ceftriaxone 2/2>2/5  Azithromycin 2/2>2/5   Subjective: Sedated and intubated.  Does not respond to voice  Objective: Vitals:   08/11/17 0445 08/11/17  0500 08/11/17 0600 08/11/17 0816  BP: (!) 129/107 (!) 112/56 (!) 110/52   Pulse: (!) 55 (!) 51 (!) 47   Resp: _0 Temp:      TempSrc:      SpO2: 94% 95% 93% 92%  Weight:  105 kg (231 lb 7.7 oz)    Height:          Intake/Output Summary (Last 24 hours) at 08/11/2017 1213 Last data filed at 08/11/2017 0500 Gross per 24 hour  Intake 1536.34 ml  Output 900 ml  Net 636.34 ml   Filed Weights   08/09/17 0500 08/10/17 0500 08/11/17 0500  Weight: 106.6 kg (235 lb 0.2 oz) 104.3 kg (229 lb 15 oz) 105 kg (231 lb 7.7 oz)    Examination:  General exam: Intubated and sedated Respiratory system: Rhonchi bilaterally. Respiratory effort normal. Cardiovascular system:RRR. No murmurs, rubs, gallops. Gastrointestinal system: Abdomen is nondistended, soft and nontender. No organomegaly or masses felt. Normal bowel sounds heard. Central nervous system: Sedated. No focal neurological deficits. Extremities: Right lower extremity swelling (chronic), no left lower extremity edema Skin: No rashes, lesions or ulcers Psychiatry: Sedated      Data Reviewed: I have personally reviewed following labs and imaging studies  CBC: Recent Labs  Lab 08/05/17 0847 08/09/17 1128 08/10/17 0357 08/11/17 0547  WBC 6.8 6.4 4.5 7.5  HGB 12.5 11.4* 11.0* 11.0*  HCT 40.5 37.2 35.3* 35.9*  MCV 102.0* 103.3* 101.7* 101.1*  PLT 174 158 166 030   Basic Metabolic Panel: Recent Labs  Lab 08/06/17 0646 08/07/17 0624 08/08/17 0427 08/09/17 1128 08/10/17 0357 08/11/17 0547  NA 139 137 137 140 140 139  K 4.4 4.6 4.2 3.6 3.6 3.4*  CL 97* 93* 93* 95* 96* 97*  CO2 _1 GLUCOSE 270* 280* 253* 282* 391* 300*  BUN _2 21* 35* 43*  CREATININE 0.90 0.90 0.84 0.95 0.90 1.00  CALCIUM 8.8* 8.8* 8.8* 8.9 8.8* 8.9  MG 1.8 1.9 2.0  --   --   --    GFR: Estimated Creatinine Clearance: 68.3 mL/min (by C-G formula based on SCr of 1 mg/dL). Liver Function Tests: Recent Labs  Lab 08/07/17 0624 08/08/17 0427 08/09/17 1128 08/10/17 0357 08/11/17 0547  AST 20 27 38 31 34  ALT _3 ALKPHOS 52 52 50 47 49  BILITOT 0.5 1.2 1.0 0.9 0.7  PROT 7.1 7.3 6.8 6.5 6.6  ALBUMIN 3.5 3.5 3.0* 2.8* 2.6*   No  results for input(s): LIPASE, AMYLASE in the last 168 hours. No results for input(s): AMMONIA in the last 168 hours. Coagulation Profile: No results for input(s): INR, PROTIME in the last 168 hours. Cardiac Enzymes: No results for input(s): CKTOTAL, CKMB, CKMBINDEX, TROPONINI in the last 168 hours. BNP (last 3 results) No results for input(s): PROBNP in the last 8760 hours. HbA1C: No results for input(s): HGBA1C in the last 72 hours. CBG: Recent Labs  Lab 08/10/17 2001 08/10/17 2348 08/11/17 0442 08/11/17 0817 08/11/17 1141  GLUCAP 415* 424* 275* 307* 303*   Lipid Profile: Recent Labs    08/09/17 1128  TRIG 131   Thyroid Function Tests: No results for input(s): TSH, T4TOTAL, FREET4, T3FREE, THYROIDAB in the last 72 hours. Anemia Panel: No results for input(s): VITAMINB12, FOLATE, FERRITIN, TIBC, IRON, RETICCTPCT in the last 72 hours. Sepsis Labs: No results for input(s): PROCALCITON, LATICACIDVEN in the last 168 hours.  Recent Results (from the  past 240 hour(s))  Culture, blood (routine x 2) Call MD if unable to obtain prior to antibiotics being given     Status: None   Collection Time: 08/05/17 12:02 PM  Result Value Ref Range Status   Specimen Description LEFT ANTECUBITAL  Final   Special Requests   Final    BOTTLES DRAWN AEROBIC AND ANAEROBIC Blood Culture adequate volume   Culture   Final    NO GROWTH 5 DAYS Performed at [facility], [address] [place], [place] [address]    Report Status 08/10/2017 FINAL  Final  Respiratory Panel by PCR     Status: None   Collection Time: 08/05/17 12:37 PM  Result Value Ref Range Status   Adenovirus NOT DETECTED NOT DETECTED Final   Coronavirus 229E NOT DETECTED NOT DETECTED Final   Coronavirus HKU1 NOT DETECTED NOT DETECTED Final   Coronavirus NL63 NOT DETECTED NOT DETECTED Final   Coronavirus OC43 NOT DETECTED NOT DETECTED Final   Metapneumovirus NOT DETECTED NOT DETECTED Final   Rhinovirus / Enterovirus NOT DETECTED  NOT DETECTED Final   Influenza A NOT DETECTED NOT DETECTED Final   Influenza B NOT DETECTED NOT DETECTED Final   Parainfluenza Virus 1 NOT DETECTED NOT DETECTED Final   Parainfluenza Virus 2 NOT DETECTED NOT DETECTED Final   Parainfluenza Virus 3 NOT DETECTED NOT DETECTED Final   Parainfluenza Virus 4 NOT DETECTED NOT DETECTED Final   Respiratory Syncytial Virus NOT DETECTED NOT DETECTED Final   Bordetella pertussis NOT DETECTED NOT DETECTED Final   Chlamydophila pneumoniae NOT DETECTED NOT DETECTED Final   Mycoplasma pneumoniae NOT DETECTED NOT DETECTED Final    Comment: Performed at [facility], [place] [address] [place], [place] [address]  MRSA PCR Screening     Status: None   Collection Time: 08/10/17  8:35 AM  Result Value Ref Range Status   MRSA by PCR NEGATIVE NEGATIVE Final    Comment:        The GeneXpert MRSA Assay (FDA approved for NASAL specimens only), is one component of a comprehensive MRSA colonization surveillance program. It is not intended to diagnose MRSA infection nor to guide or monitor treatment for MRSA infections. Performed at [facility], [address] [place],  [address]          Radiology Studies: Dg Chest [facility] 1 View  Result Date: 08/11/2017 CLINICAL DATA:  Respiratory failure EXAM: PORTABLE CHEST 1 VIEW COMPARISON:  08/10/2017 FINDINGS: Cardiac shadow remains enlarged. Aortic calcifications are again seen. Endotracheal tube and nasogastric catheter are noted in satisfactory position. Diffuse airspace opacities are again identified bilaterally with some improved aeration in the left upper lobe. No bony abnormality is seen. IMPRESSION: Slight improved aeration particularly on the left. Electronically Signed   By: Inez Catalina M.D.   On: 08/11/2017 07:54   Portable Chest Xray  Result Date: 08/10/2017 CLINICAL DATA:  Endotracheal tube EXAM: PORTABLE CHEST 1 VIEW COMPARISON:  08/09/2017 FINDINGS: Endotracheal tube 3 cm above the carina.   NG tube in the stomach Diffuse bilateral airspace disease asymmetric on the left shows mild interval improvement. No significant effusion. IMPRESSION: Endotracheal tube 3 cm above the carina Severe diffuse bilateral airspace disease with mild interval improvement from yesterday. Electronically Signed   By: Franchot Gallo M.D.   On: 08/10/2017 06:51        Scheduled Meds: . acetylcysteine  3 mL Nebulization Q4H  . amiodarone  200 mg Oral q morning - 10a  . apixaban  5 mg Oral BID  . chlorhexidine gluconate (MEDLINE KIT)  15 mL Mouth Rinse BID  . dextromethorphan  30 mg Oral BID  . DULoxetine  30 mg Oral BID  . feeding supplement (VITAL HIGH PROTEIN)  1,000 mL Per Tube Q24H  . furosemide  40 mg Oral Daily  . gabapentin  400 mg Oral BID  . gabapentin  800 mg Oral QHS  . insulin aspart  0-20 Units Subcutaneous Q4H  . insulin glargine  45 Units Subcutaneous QHS  . ipratropium-albuterol  3 mL Nebulization Q4H  . ketoconazole   Topical BID  . levETIRAcetam  1,000 mg Oral BID  . levothyroxine  137 mcg Oral QAC breakfast  . magnesium oxide  400 mg Oral Daily  . mouth rinse  15 mL Mouth Rinse QID  . methylPREDNISolone (SOLU-MEDROL) injection  40 mg Intravenous Q12H  . multivitamin with minerals  1 tablet Oral Daily  . nystatin   Topical TID  . pantoprazole (PROTONIX) IV  40 mg Intravenous Q24H  . potassium chloride  20 mEq Oral Daily   Continuous Infusions: . ceFEPime (MAXIPIME) IV 1 g (08/11/17 1211)  . fentaNYL infusion INTRAVENOUS 200 mcg/hr (08/11/17 1205)  . midazolam (VERSED) infusion 4 mg/hr (08/10/17 2357)     LOS: 6 days    Time spent: critical care time: 30 minutes    Kathie Dike, MD [facility] Pager [phone]  If 7PM-7AM, please contact night-coverage [email or web address] Password [facility] 08/11/2017, 12:13 PM

## 2017-08-12 ENCOUNTER — Inpatient Hospital Stay (HOSPITAL_COMMUNITY): Payer: Medicare Other

## 2017-08-12 LAB — BLOOD GAS, ARTERIAL
ACID-BASE EXCESS: 6.4 mmol/L — AB (ref 0.0–2.0)
Bicarbonate: 30.3 mmol/L — ABNORMAL HIGH (ref 20.0–28.0)
DRAWN BY: 105551
FIO2: 65
MECHANICAL RATE: 20
O2 SAT: 94.9 %
PCO2 ART: 38.6 mmHg (ref 32.0–48.0)
PEEP: 5 cmH2O
PH ART: 7.499 — AB (ref 7.350–7.450)
PO2 ART: 75.8 mmHg — AB (ref 83.0–108.0)
RATE: 20 resp/min
VT: 520 mL

## 2017-08-12 LAB — COMPREHENSIVE METABOLIC PANEL
ALT: 19 U/L (ref 14–54)
ANION GAP: 13 (ref 5–15)
AST: 25 U/L (ref 15–41)
Albumin: 2.3 g/dL — ABNORMAL LOW (ref 3.5–5.0)
Alkaline Phosphatase: 44 U/L (ref 38–126)
BUN: 38 mg/dL — AB (ref 6–20)
CHLORIDE: 100 mmol/L — AB (ref 101–111)
CO2: 26 mmol/L (ref 22–32)
Calcium: 7.9 mg/dL — ABNORMAL LOW (ref 8.9–10.3)
Creatinine, Ser: 0.85 mg/dL (ref 0.44–1.00)
GFR calc Af Amer: >60 mL/min (ref >60)
GFR calc non Af Amer: >60 mL/min (ref >60)
GLUCOSE: 291 mg/dL — AB (ref 65–99)
POTASSIUM: 2.8 mmol/L — AB (ref 3.5–5.1)
SODIUM: 139 mmol/L (ref 135–145)
Total Bilirubin: 0.4 mg/dL (ref 0.3–1.2)
Total Protein: 5.8 g/dL — ABNORMAL LOW (ref 6.5–8.1)

## 2017-08-12 LAB — CBC
HEMATOCRIT: 32.6 % — AB (ref 36.0–46.0)
Hemoglobin: 10.1 g/dL — ABNORMAL LOW (ref 12.0–15.0)
MCH: 31.4 pg (ref 26.0–34.0)
MCHC: 31 g/dL (ref 30.0–36.0)
MCV: 101.2 fL — ABNORMAL HIGH (ref 78.0–100.0)
Platelets: 175 10*3/uL (ref 150–400)
RBC: 3.22 MIL/uL — ABNORMAL LOW (ref 3.87–5.11)
RDW: 14.7 % (ref 11.5–15.5)
WBC: 8.1 10*3/uL (ref 4.0–10.5)

## 2017-08-12 LAB — GLUCOSE, CAPILLARY
GLUCOSE-CAPILLARY: 285 mg/dL — AB (ref 65–99)
GLUCOSE-CAPILLARY: 299 mg/dL — AB (ref 65–99)
Glucose-Capillary: 290 mg/dL — ABNORMAL HIGH (ref 65–99)
Glucose-Capillary: 302 mg/dL — ABNORMAL HIGH (ref 65–99)
Glucose-Capillary: 304 mg/dL — ABNORMAL HIGH (ref 65–99)
Glucose-Capillary: 315 mg/dL — ABNORMAL HIGH (ref 65–99)
Glucose-Capillary: 356 mg/dL — ABNORMAL HIGH (ref 65–99)

## 2017-08-12 LAB — MAGNESIUM: Magnesium: 2 mg/dL (ref 1.7–2.4)

## 2017-08-12 LAB — TRIGLYCERIDES: TRIGLYCERIDES: 130 mg/dL (ref <150)

## 2017-08-12 MED ORDER — POTASSIUM CHLORIDE 10 MEQ/100ML IV SOLN
10.0000 meq | INTRAVENOUS | Status: AC
  Administered 2017-08-12 (×6): 10 meq via INTRAVENOUS
  Filled 2017-08-12 (×5): qty 100

## 2017-08-12 MED ORDER — POTASSIUM CHLORIDE 10 MEQ/50ML IV SOLN
10.0000 meq | INTRAVENOUS | Status: DC
  Filled 2017-08-12 (×6): qty 50

## 2017-08-12 MED ORDER — FENTANYL CITRATE (PF) 2500 MCG/50ML IJ SOLN
INTRAMUSCULAR | Status: AC
  Filled 2017-08-12: qty 50

## 2017-08-12 NOTE — Progress Notes (Signed)
Subjective: She remains intubated and on the ventilator no new problems have been noted.  She is moving around a little bit more today.  Objective: Vital signs in last 24 hours: Temp:  [98 F (36.7 C)-99.6 F (37.6 C)] 98 F (36.7 C) (02/09 0400) Pulse Rate:  [44-62] 59 (02/09 0430) Resp:  [18-22] 20 (02/09 0430) BP: (95-130)/(40-108) 96/40 (02/09 0430) SpO2:  [91 %-97 %] 95 % (02/09 0430) FiO2 (%):  [65 %] 65 % (02/09 0400) Weight:  [104.5 kg (230 lb 6.1 oz)] 104.5 kg (230 lb 6.1 oz) (02/09 0400) Weight change: -0.5 kg (-1.6 oz) Last BM Date: 08/10/17  Intake/Output from previous day: 02/08 0701 - 02/09 0700 In: 1634.7 [I.V.:548.5; NG/GT:1086.3] Out: 1900 [Urine:1900]  PHYSICAL EXAM General appearance: Intubated sedated more responsive than previously Resp: rales bilaterally Cardio: regular rate and rhythm, S1, S2 normal, no murmur, click, rub or gallop GI: soft, non-tender; bowel sounds normal; no masses,  no organomegaly Extremities: extremities normal, atraumatic, no cyanosis or edema Skin warm and dry.  Lab Results:  Results for orders placed or performed during the hospital encounter of 08/05/17 (from the past 48 hour(s))  Glucose, capillary     Status: Abnormal   Collection Time: 08/10/17 11:26 AM  Result Value Ref Range   Glucose-Capillary 441 (H) 65 - 99 mg/dL  Glucose, capillary     Status: Abnormal   Collection Time: 08/10/17  4:13 PM  Result Value Ref Range   Glucose-Capillary 249 (H) 65 - 99 mg/dL   Comment 1 Notify RN    Comment 2 Document in Chart   Glucose, capillary     Status: Abnormal   Collection Time: 08/10/17  8:01 PM  Result Value Ref Range   Glucose-Capillary 415 (H) 65 - 99 mg/dL   Comment 1 Notify RN    Comment 2 Document in Chart   Glucose, capillary     Status: Abnormal   Collection Time: 08/10/17 11:48 PM  Result Value Ref Range   Glucose-Capillary 424 (H) 65 - 99 mg/dL   Comment 1 Notify RN   Glucose, capillary     Status: Abnormal    Collection Time: 08/11/17  4:42 AM  Result Value Ref Range   Glucose-Capillary 275 (H) 65 - 99 mg/dL  CBC     Status: Abnormal   Collection Time: 08/11/17  5:47 AM  Result Value Ref Range   WBC 7.5 4.0 - 10.5 K/uL   RBC 3.55 (L) 3.87 - 5.11 MIL/uL   Hemoglobin 11.0 (L) 12.0 - 15.0 g/dL   HCT 35.9 (L) 36.0 - 46.0 %   MCV 101.1 (H) 78.0 - 100.0 fL   MCH 31.0 26.0 - 34.0 pg   MCHC 30.6 30.0 - 36.0 g/dL   RDW 14.9 11.5 - 15.5 %   Platelets 186 150 - 400 K/uL    Comment: Performed at [facility], [address], [place], [place] [address]  Comprehensive metabolic panel     Status: Abnormal   Collection Time: 08/11/17  5:47 AM  Result Value Ref Range   Sodium 139 135 - 145 mmol/L   Potassium 3.4 (L) 3.5 - 5.1 mmol/L   Chloride 97 (L) 101 - 111 mmol/L   CO2 31 22 - 32 mmol/L   Glucose, Bld 300 (H) 65 - 99 mg/dL   BUN 43 (H) 6 - 20 mg/dL   Creatinine, Ser 1.00 0.44 - 1.00 mg/dL   Calcium 8.9 8.9 - 10.3 mg/dL   Total Protein 6.6 6.5 -  8.1 g/dL   Albumin 2.6 (L) 3.5 - 5.0 g/dL   AST 34 15 - 41 U/L   ALT 20 14 - 54 U/L   Alkaline Phosphatase 49 38 - 126 U/L   Total Bilirubin 0.7 0.3 - 1.2 mg/dL   GFR calc non Af Amer 57 (L) >60 mL/min   GFR calc Af Amer >60 >60 mL/min    Comment: (NOTE) The eGFR has been calculated using the CKD EPI equation. This calculation has not been validated in all clinical situations. eGFR's persistently <60 mL/min signify possible Chronic Kidney Disease.    Anion gap 11 5 - 15    Comment: Performed at [facility], [address] [place], [place] [address]  Glucose, capillary     Status: Abnormal   Collection Time: 08/11/17  8:17 AM  Result Value Ref Range   Glucose-Capillary 307 (H) 65 - 99 mg/dL  Blood gas, arterial     Status: Abnormal   Collection Time: 08/11/17  8:25 AM  Result Value Ref Range   FIO2 70.00    Delivery systems VENTILATOR    Mode PRESSURE REGULATED VOLUME CONTROL    VT 520 mL   LHR 20 resp/min   Peep/cpap 5.0 cm H20   pH,  Arterial 7.529 (H) 7.350 - 7.450   pCO2 arterial 39.3 32.0 - 48.0 mmHg   pO2, Arterial 71.0 (L) 83.0 - 108.0 mmHg   Bicarbonate 32.9 (H) 20.0 - 28.0 mmol/L   Acid-Base Excess 9.2 (H) 0.0 - 2.0 mmol/L   O2 Saturation 94.5 %   Collection site BRACHIAL ARTERY    Drawn by [id number]    Sample type ARTERIAL    Allens test (pass/fail) NOT INDICATED (A) PASS    Comment: Performed at [facility], [address], [place], [place] [address]  Glucose, capillary     Status: Abnormal   Collection Time: 08/11/17 11:41 AM  Result Value Ref Range   Glucose-Capillary 303 (H) 65 - 99 mg/dL   Comment 1 Notify RN    Comment 2 Document in Chart   Glucose, capillary     Status: Abnormal   Collection Time: 08/11/17  3:54 PM  Result Value Ref Range   Glucose-Capillary 242 (H) 65 - 99 mg/dL   Comment 1 Notify RN    Comment 2 Document in Chart   Glucose, capillary     Status: Abnormal   Collection Time: 08/11/17  8:40 PM  Result Value Ref Range   Glucose-Capillary 332 (H) 65 - 99 mg/dL   Comment 1 Notify RN    Comment 2 Document in Chart   Glucose, capillary     Status: Abnormal   Collection Time: 08/11/17 11:56 PM  Result Value Ref Range   Glucose-Capillary 293 (H) 65 - 99 mg/dL   Comment 1 Notify RN    Comment 2 Document in Chart   Glucose, capillary     Status: Abnormal   Collection Time: 08/12/17 12:50 AM  Result Value Ref Range   Glucose-Capillary 299 (H) 65 - 99 mg/dL  CBC     Status: Abnormal   Collection Time: 08/12/17  4:25 AM  Result Value Ref Range   WBC 8.1 4.0 - 10.5 K/uL   RBC 3.22 (L) 3.87 - 5.11 MIL/uL   Hemoglobin 10.1 (L) 12.0 - 15.0 g/dL   HCT 32.6 (L) 36.0 - 46.0 %   MCV 101.2 (H) 78.0 - 100.0 fL   MCH 31.4 26.0 - 34.0 pg   MCHC 31.0 30.0 -  36.0 g/dL   RDW 14.7 11.5 - 15.5 %   Platelets 175 150 - 400 K/uL    Comment: Performed at [facility], [address], [place], [place] [address]  Comprehensive metabolic panel     Status: Abnormal   Collection Time: 08/12/17  4:25 AM   Result Value Ref Range   Sodium 139 135 - 145 mmol/L   Potassium 2.8 (L) 3.5 - 5.1 mmol/L    Comment: DELTA CHECK NOTED   Chloride 100 (L) 101 - 111 mmol/L   CO2 26 22 - 32 mmol/L   Glucose, Bld 291 (H) 65 - 99 mg/dL   BUN 38 (H) 6 - 20 mg/dL   Creatinine, Ser 0.85 0.44 - 1.00 mg/dL   Calcium 7.9 (L) 8.9 - 10.3 mg/dL   Total Protein 5.8 (L) 6.5 - 8.1 g/dL   Albumin 2.3 (L) 3.5 - 5.0 g/dL   AST 25 15 - 41 U/L   ALT 19 14 - 54 U/L   Alkaline Phosphatase 44 38 - 126 U/L   Total Bilirubin 0.4 0.3 - 1.2 mg/dL   GFR calc non Af Amer >60 >60 mL/min   GFR calc Af Amer >60 >60 mL/min    Comment: (NOTE) The eGFR has been calculated using the CKD EPI equation. This calculation has not been validated in all clinical situations. eGFR's persistently <60 mL/min signify possible Chronic Kidney Disease.    Anion gap 13 5 - 15    Comment: Performed at [facility], [address], [place], [place] [address]  Triglycerides     Status: None   Collection Time: 08/12/17  4:25 AM  Result Value Ref Range   Triglycerides 130 <150 mg/dL    Comment: Performed at [facility], [address] [place], [place] [address]  Glucose, capillary     Status: Abnormal   Collection Time: 08/12/17  5:04 AM  Result Value Ref Range   Glucose-Capillary 304 (H) 65 - 99 mg/dL  Blood gas, arterial     Status: Abnormal   Collection Time: 08/12/17  5:10 AM  Result Value Ref Range   FIO2 65.00    Delivery systems VENTILATOR    Mode PRESSURE REGULATED VOLUME CONTROL    VT 520 mL   LHR 20 resp/min   Peep/cpap 5.0 cm H20   pH, Arterial 7.499 (H) 7.350 - 7.450   pCO2 arterial 38.6 32.0 - 48.0 mmHg   pO2, Arterial 75.8 (L) 83.0 - 108.0 mmHg   Bicarbonate 30.3 (H) 20.0 - 28.0 mmol/L   Acid-Base Excess 6.4 (H) 0.0 - 2.0 mmol/L   O2 Saturation 94.9 %   Collection site BRACHIAL ARTERY    Drawn by [id number]    Sample type ARTERIAL    Mechanical Rate 20     Comment: Performed at [facility], [address],  [place], [place] [address]  Glucose, capillary     Status: Abnormal   Collection Time: 08/12/17  7:51 AM  Result Value Ref Range   Glucose-Capillary 290 (H) 65 - 99 mg/dL    ABGS Recent Labs    08/12/17 0510  PHART 7.499*  PO2ART 75.8*  HCO3 30.3*   CULTURES Recent Results (from the past 240 hour(s))  Culture, blood (routine x 2) Call MD if unable to obtain prior to antibiotics being given     Status: None   Collection Time: 08/05/17 12:02 PM  Result Value Ref Range Status   Specimen Description LEFT ANTECUBITAL  Final   Special Requests  Final    BOTTLES DRAWN AEROBIC AND ANAEROBIC Blood Culture adequate volume   Culture   Final    NO GROWTH 5 DAYS Performed at [facility], [address], [place], [place] [address]    Report Status 08/10/2017 FINAL  Final  Respiratory Panel by PCR     Status: None   Collection Time: 08/05/17 12:37 PM  Result Value Ref Range Status   Adenovirus NOT DETECTED NOT DETECTED Final   Coronavirus 229E NOT DETECTED NOT DETECTED Final   Coronavirus HKU1 NOT DETECTED NOT DETECTED Final   Coronavirus NL63 NOT DETECTED NOT DETECTED Final   Coronavirus OC43 NOT DETECTED NOT DETECTED Final   Metapneumovirus NOT DETECTED NOT DETECTED Final   Rhinovirus / Enterovirus NOT DETECTED NOT DETECTED Final   Influenza A NOT DETECTED NOT DETECTED Final   Influenza B NOT DETECTED NOT DETECTED Final   Parainfluenza Virus 1 NOT DETECTED NOT DETECTED Final   Parainfluenza Virus 2 NOT DETECTED NOT DETECTED Final   Parainfluenza Virus 3 NOT DETECTED NOT DETECTED Final   Parainfluenza Virus 4 NOT DETECTED NOT DETECTED Final   Respiratory Syncytial Virus NOT DETECTED NOT DETECTED Final   Bordetella pertussis NOT DETECTED NOT DETECTED Final   Chlamydophila pneumoniae NOT DETECTED NOT DETECTED Final   Mycoplasma pneumoniae NOT DETECTED NOT DETECTED Final    Comment: Performed at [facility], [place] [address], [place], [place] [address]  MRSA PCR Screening     Status:  None   Collection Time: 08/10/17  8:35 AM  Result Value Ref Range Status   MRSA by PCR NEGATIVE NEGATIVE Final    Comment:        The GeneXpert MRSA Assay (FDA approved for NASAL specimens only), is one component of a comprehensive MRSA colonization surveillance program. It is not intended to diagnose MRSA infection nor to guide or monitor treatment for MRSA infections. Performed at [facility], [address], [place], [place] [address]    Studies/Results: Dg Chest Port 1 View  Result Date: 08/12/2017 CLINICAL DATA:  Respiratory failure with shortness of breath EXAM: PORTABLE CHEST 1 VIEW COMPARISON:  Yesterday FINDINGS: Endotracheal tube tip between the clavicular heads and carina. Orogastric tube with tip at the GE junction. Cardiopericardial enlargement. There is diffuse hazy opacity of the chest with probable pleural fluid greater on the left. No visible pneumothorax. Artifact from EKG leads. These results will be called to the ordering clinician or representative by the Radiologist Assistant, and communication documented in the PACS or zVision Dashboard. IMPRESSION: 1. Orogastric tube tip at the GE junction. Consider ~5 cm advancement into the stomach. 2. CHF pattern similar to yesterday. Electronically Signed   By: Monte Fantasia M.D.   On: 08/12/2017 07:52   Dg Chest Port 1 View  Result Date: 08/11/2017 CLINICAL DATA:  Respiratory failure EXAM: PORTABLE CHEST 1 VIEW COMPARISON:  08/10/2017 FINDINGS: Cardiac shadow remains enlarged. Aortic calcifications are again seen. Endotracheal tube and nasogastric catheter are noted in satisfactory position. Diffuse airspace opacities are again identified bilaterally with some improved aeration in the left upper lobe. No bony abnormality is seen. IMPRESSION: Slight improved aeration particularly on the left. Electronically Signed   By: Inez Catalina M.D.   On: 08/11/2017 07:54    Medications:  Prior to Admission:  Medications Prior to  Admission  Medication Sig Dispense Refill Last Dose  . acetaminophen (TYLENOL) 500 MG tablet Take 500 mg by mouth every 6 (six) hours as needed for moderate pain or headache.  08/05/2017 at 0630  . albuterol (PROVENTIL HFA;VENTOLIN HFA) 108 (90 Base) MCG/ACT inhaler Inhale 2 puffs into the lungs every 4 (four) hours as needed for wheezing or shortness of breath. 1 Inhaler 0 08/04/2017 at Unknown time  . amiodarone (PACERONE) 200 MG tablet Take 1 tablet (200 mg total) by mouth every morning. 90 tablet 3 08/05/2017 at Unknown time  . amoxicillin (AMOXIL) 500 MG capsule Take 1 capsule by mouth 3 (three) times daily.  0 08/05/2017 at 0700  . apixaban (ELIQUIS) 5 MG TABS tablet Take 1 tablet (5 mg total) by mouth 2 (two) times daily. 60 tablet 0 08/05/2017 at Unknown time  . Cyanocobalamin (B-12) 5000 MCG SUBL Take 1 tablet by mouth daily.  12 08/05/2017 at 0700  . dicyclomine (BENTYL) 10 MG capsule TAKE 1 TO 2 CAPSULES BY MOUTH BEFORE meals AS NEEDED 180 capsule 3 08/04/2017 at Unknown time  . DULoxetine (CYMBALTA) 30 MG capsule Take 30 mg by mouth 2 (two) times daily.   08/05/2017 at 0700  . famotidine (PEPCID) 20 MG tablet Take 20 mg by mouth at bedtime.    08/04/2017 at Unknown time  . furosemide (LASIX) 40 MG tablet TAKE 40 MG DAILY. 90 tablet 3 08/05/2017 at Unknown time  . gabapentin (NEURONTIN) 400 MG capsule Take 400-800 mg by mouth 3 (three) times daily. Take 1 capsule(426m) by mouth twice a day and take 2 capsules(80100m by mouth at bedtime  11 08/05/2017 at 0700  . glipiZIDE (GLUCOTROL) 10 MG tablet Take 10 mg by mouth 2 (two) times daily before a meal.    08/05/2017 at Unknown time  . Insulin Glargine (LANTUS SOLOSTAR) 100 UNIT/ML Solostar Pen Inject 20-50 Units into the skin at bedtime as needed. Patient takes 20 units every night at bedtime if over 178.  Takes 50 units if her blood sugar is above 300.   08/04/2017 at 2200  . levETIRAcetam (KEPPRA) 1000 MG tablet Take 1,000 mg by mouth 2 (two) times daily.  1  08/05/2017 at 0700  . levothyroxine (SYNTHROID, LEVOTHROID) 137 MCG tablet Take 137 mcg by mouth daily.   08/05/2017 at 0700  . loperamide (IMODIUM) 2 MG capsule TAKE ONE CAPSULE BY MOUTH TWICE DAILY AS NEEDED 60 capsule 3 08/04/2017 at Unknown time  . LORazepam (ATIVAN) 1 MG tablet Take 1 mg by mouth 3 (three) times daily as needed for anxiety or sleep. For anxiety   08/04/2017 at 2200  . magnesium oxide (MAG-OX) 400 MG tablet Take 400 mg by mouth daily.    08/05/2017 at Unknown time  . metaxalone (SKELAXIN) 800 MG tablet Take 800 mg by mouth 3 (three) times daily.    08/05/2017 at 0700  . metFORMIN (GLUCOPHAGE) 1000 MG tablet Take 1,000 mg by mouth 2 (two) times daily with a meal.    08/05/2017 at 0700  . Multiple Vitamin (MULTIVITAMIN WITH MINERALS) TABS tablet Take 1 tablet by mouth daily.   08/05/2017 at 0700  . nitroGLYCERIN (NITROSTAT) 0.4 MG SL tablet Place 0.4 mg under the tongue every 5 (five) minutes as needed for chest pain. Reported on 07/08/2015   Taking  . ondansetron (ZOFRAN) 4 MG tablet TAKE ONE TABLET BY MOUTH FOUR TIMES DAILY FOR NAUSEA (Patient taking differently: TAKE ONE TABLET BY MOUTH FOUR TIMES DAILY FOR NAUSEA. Takes as needed) 120 tablet 3 Past Week at Unknown time  . pantoprazole (PROTONIX) 40 MG tablet TAKE ONE TABLET BY MOUTH TWICE DAILY BEFORE APPLY MEAL 180 tablet 3 08/05/2017 at 0700  .  potassium chloride (K-DUR,KLOR-CON) 10 MEQ tablet TAKE TWO TABLETS BY MOUTH TWICE DAILY 120 tablet 11 08/05/2017 at 0700  . promethazine (PHENERGAN) 12.5 MG tablet Take 1 tablet (12.5 mg total) by mouth every 4 (four) hours as needed for nausea or vomiting. 42 tablet 0 Taking  . UNABLE TO FIND Take 1 tablet by mouth 2 (two) times daily. Medication Name: [employer] RX Multivitamin Diabetes Support  12 08/05/2017 at 0700   Scheduled: . acetylcysteine  3 mL Nebulization Q4H  . amiodarone  200 mg Oral q morning - 10a  . apixaban  5 mg Oral BID  . chlorhexidine gluconate (MEDLINE KIT)  15 mL Mouth Rinse BID  .  dextromethorphan  30 mg Oral BID  . DULoxetine  30 mg Oral BID  . furosemide  40 mg Oral Daily  . gabapentin  400 mg Oral BID  . gabapentin  800 mg Oral QHS  . insulin aspart  0-20 Units Subcutaneous Q4H  . insulin glargine  55 Units Subcutaneous QHS  . ipratropium-albuterol  3 mL Nebulization Q4H  . ketoconazole   Topical BID  . levETIRAcetam  1,000 mg Oral BID  . levothyroxine  137 mcg Oral QAC breakfast  . magnesium oxide  400 mg Oral Daily  . mouth rinse  15 mL Mouth Rinse QID  . methylPREDNISolone (SOLU-MEDROL) injection  40 mg Intravenous Q12H  . multivitamin with minerals  1 tablet Oral Daily  . nystatin   Topical TID  . pantoprazole (PROTONIX) IV  40 mg Intravenous Q24H  . potassium chloride  20 mEq Oral Daily   Continuous: . ceFEPime (MAXIPIME) IV Stopped (08/12/17 0855)  . feeding supplement (VITAL HIGH PROTEIN) 1,000 mL (08/11/17 1404)  . fentaNYL infusion INTRAVENOUS 300 mcg/hr (08/12/17 0208)  . midazolam (VERSED) infusion 4 mg/hr (08/11/17 2131)  . potassium chloride     CHY:IFOYDXAJOINOM, albuterol, fentaNYL, fentaNYL (SUBLIMAZE) injection, haloperidol lactate, loperamide, midazolam, nitroGLYCERIN, ondansetron (ZOFRAN) IV  Assesment: She has community-acquired pneumonia and failed outpatient treatment.  She was initially treated with antibiotics but continued to deteriorate and eventually required intubation and mechanical ventilation.  She remains on the ventilator now.  She had near white out of her left lung earlier this week and her chest x-ray has improved markedly with treatment.  Chest x-ray is being read by the radiologist as heart failure but I think it is more of the diffuse bilateral pneumonia  She has had trouble with atrial for but she is in sinus bradycardia now with a rate of about 50.  She does have a history of congestive heart failure but I think she is euvolemic now.  She has sleep apnea which complicates her situation when she is off the  ventilator.  She is chronically anticoagulated but no evidence of bleeding Principal Problem:   CAP Failed Outpatient Tx Active Problems:   Hypothyroidism   Hyperlipidemia   Essential hypertension   Atrial fibrillation (HCC)   Congestive heart failure (HCC)   Gastroparesis   Sleep apnea   Chronic anticoagulation   Tremor   Dementia   DM type 2 (diabetes mellitus, type 2) (HCC)   Pulmonary hypertension (HCC)   COPD exacerbation (HCC)   Acute respiratory failure with hypercapnia ([place])    Plan: Continue treatments.  She is down to 55% oxygen now from 70% yesterday and she is oxygenating well.  Her chest x-ray is improving.  She is not ready for weaning and extubation attempts yet but will be soon    LOS: 7  days   Angelia Hazell L 08/12/2017, 9:10 AM

## 2017-08-12 NOTE — Progress Notes (Signed)
Per Dr. Juanetta Gosling recommendation, will not perform wake up assessment until we are able to get her FiO2 down to 45%

## 2017-08-12 NOTE — Progress Notes (Signed)
Per CXR results OG tube advanced. OG tube noted not to be flushing. Multiple attempts to try to remove blockage from OG Tube. OG Tube removed and new 18 Fr OG tube in place. Dr. Kerry Hough notified and stat CXR ordered.

## 2017-08-12 NOTE — Progress Notes (Signed)
PROGRESS NOTE    [name]  GNO:[id number] DOB: March 02, 1951 DOA: 08/05/2017 PCP: Lemmie Evens, MD    Brief Narrative:  [name] a 67 y.o.femalewith multiple medical comorbidities detailed below presented to the emergency department with persistent cough and shortness of breath for the past 2 weeks that failed outpatient management with 2 courses of oral amoxicillin. Patient reports that she is on her second round of amoxicillin and continues to feel short of breath. She has nonproductive cough. She has some wheezing. She has no chest pain. The patient has no swelling in the extremities. She has a congestive heart failure that is chronic and she is taking her diuretics regularly. She denies palpitations. She presented to the ED and noted to be tachypneic. She was afebrile. A chest x-ray was obtained that did show a large left retro-cardiac opacity that was suspicious for pneumonia. EKG was unremarkable except for notation of prolonged PR interval. Her troponin was 0.07. She had a mildly elevated BNP at 189. The patient's blood sugar was elevated at 236. She was given treatment in the emergency department but remained tachypneic. She is going to be admitted for community-acquired pneumonia that has failed outpatient treatment.  Assessment & Plan:   Principal Problem:   CAP Failed Outpatient Tx Active Problems:   Hypothyroidism   Hyperlipidemia   Essential hypertension   Atrial fibrillation (HCC)   Congestive heart failure (HCC)   Gastroparesis   Sleep apnea   Chronic anticoagulation   Tremor   Dementia   DM type 2 (diabetes mellitus, type 2) (HCC)   Pulmonary hypertension (HCC)   COPD exacerbation (HCC)   Acute respiratory failure with hypercapnia ([place])   1. Acute respiratory failure with hypoxia and hypercapnia.  Related to progressive pneumonia.  She also has underlying COPD.  Patient was initially tried on BiPAP, but continued to be very short of breath  and did not have any significant improvement.  She required intubation with mechanical ventilation on 2/6.  Pulmonology following.  FiO2 is being weaned down to 55% today.  Clinically she is slowly improving although she is not ready for weaning trial/extubation yet.  We will continue to wean down oxygen as tolerated.  Chest x-ray was independently reviewed and is showing improvement of left-sided infiltrate.  Continue current treatments 2. Community-acquired pneumonia.  Initially started on ceftriaxone and azithromycin.  Chest x-ray had shown progressive worsening of pneumonia.  Antibiotics were changed to vancomycin and cefepime.  Follow-up chest x-rays have shown progressive improvement.  MRSA PCR is negative so vancomycin was discontinued.  Continue on cefepime. 3. COPD.  No wheezing at this time.  Continue on bronchodilators as well as intravenous steroids. 4. Chronic diastolic heart failure.  Stable.  Appears compensated at this time.  Currently on oral Lasix.  Monitor renal function. 5. Hypertension.  Blood pressures currently stable.  Continue to monitor 6. Type 2 diabetes.  On basal insulin as well as sliding scale.  Blood sugars are still elevated, although stable.  Lantus dose is being adjusted.  Continue sliding scale. 7. Paroxysmal atrial fibrillation.  Currently on amiodarone.  Heart rate is stable.  Anticoagulated with Eliquis. 8. Hypothyroidism.  Stable.  Continue on Synthroid 9. Seizure disorder.  Stable.  Continue on Keppra    DVT prophylaxis: apixiban Code Status: full code Family Communication: 2/9 discussed with daughters at the bedside Disposition Plan: pending hospital course, may need placement   Consultants:   pulmonology  Procedures:   Intubation 2/6>  Antimicrobials:  Vancomycin 2/5> 2/8  Cefepime 2/5>  Ceftriaxone 2/2>2/5  Azithromycin 2/2>2/5   Subjective: Sedated and intubated.  Objective: Vitals:   08/12/17 0345 08/12/17 0400 08/12/17 0430  08/12/17 0800  BP:  (!) 101/43 (!) 96/40   Pulse: (!) 58 (!) 56 (!) 59   Resp: _0 Temp:  98 F (36.7 C)  98 F (36.7 C)  TempSrc:  Oral  Axillary  SpO2: 97% 97% 95%   Weight:  104.5 kg (230 lb 6.1 oz)    Height:  _1  (1.727 m)      Intake/Output Summary (Last 24 hours) at 08/12/2017 1126 Last data filed at 08/12/2017 0530 Gross per 24 hour  Intake 1634.71 ml  Output 1900 ml  Net -265.29 ml   Filed Weights   08/10/17 0500 08/11/17 0500 08/12/17 0400  Weight: 104.3 kg (229 lb 15 oz) 105 kg (231 lb 7.7 oz) 104.5 kg (230 lb 6.1 oz)    Examination:  General exam: Resting, intubated Respiratory system: Clear to auscultation. Respiratory effort normal. Cardiovascular system:RRR. No murmurs, rubs, gallops. Gastrointestinal system: Abdomen is nondistended, soft and nontender. No organomegaly or masses felt. Normal bowel sounds heard. Central nervous system: Sedated. No focal neurological deficits. Extremities: Right lower extremity edema (chronic) Skin: No rashes, lesions or ulcers Psychiatry: Sedated    Data Reviewed: I have personally reviewed following labs and imaging studies  CBC: Recent Labs  Lab 08/09/17 1128 08/10/17 0357 08/11/17 0547 08/12/17 0425  WBC 6.4 4.5 7.5 8.1  HGB 11.4* 11.0* 11.0* 10.1*  HCT 37.2 35.3* 35.9* 32.6*  MCV 103.3* 101.7* 101.1* 101.2*  PLT 158 166 186 742   Basic Metabolic Panel: Recent Labs  Lab 08/06/17 0646 08/07/17 0624 08/08/17 0427 08/09/17 1128 08/10/17 0357 08/11/17 0547 08/12/17 0425  NA 139 137 137 140 140 139 139  K 4.4 4.6 4.2 3.6 3.6 3.4* 2.8*  CL 97* 93* 93* 95* 96* 97* 100*  CO2 _2 GLUCOSE 270* 280* 253* 282* 391* 300* 291*  BUN _3 21* 35* 43* 38*  CREATININE 0.90 0.90 0.84 0.95 0.90 1.00 0.85  CALCIUM 8.8* 8.8* 8.8* 8.9 8.8* 8.9 7.9*  MG 1.8 1.9 2.0  --   --   --  2.0   GFR: Estimated Creatinine Clearance: 80.1 mL/min (by C-G formula based on SCr of 0.85 mg/dL). Liver  Function Tests: Recent Labs  Lab 08/08/17 0427 08/09/17 1128 08/10/17 0357 08/11/17 0547 08/12/17 0425  AST 27 38 31 34 25  ALT _4 ALKPHOS 52 50 47 49 44  BILITOT 1.2 1.0 0.9 0.7 0.4  PROT 7.3 6.8 6.5 6.6 5.8*  ALBUMIN 3.5 3.0* 2.8* 2.6* 2.3*   No results for input(s): LIPASE, AMYLASE in the last 168 hours. No results for input(s): AMMONIA in the last 168 hours. Coagulation Profile: No results for input(s): INR, PROTIME in the last 168 hours. Cardiac Enzymes: No results for input(s): CKTOTAL, CKMB, CKMBINDEX, TROPONINI in the last 168 hours. BNP (last 3 results) No results for input(s): PROBNP in the last 8760 hours. HbA1C: No results for input(s): HGBA1C in the last 72 hours. CBG: Recent Labs  Lab 08/11/17 2040 08/11/17 2356 08/12/17 0050 08/12/17 0504 08/12/17 0751  GLUCAP 332* 293* 299* 304* 290*   Lipid Profile: Recent Labs    08/09/17 1128 08/12/17 0425  TRIG 131 130   Thyroid Function Tests: No results for input(s): TSH, T4TOTAL, FREET4, T3FREE, THYROIDAB  in the last 72 hours. Anemia Panel: No results for input(s): VITAMINB12, FOLATE, FERRITIN, TIBC, IRON, RETICCTPCT in the last 72 hours. Sepsis Labs: No results for input(s): PROCALCITON, LATICACIDVEN in the last 168 hours.  Recent Results (from the past 240 hour(s))  Culture, blood (routine x 2) Call MD if unable to obtain prior to antibiotics being given     Status: None   Collection Time: 08/05/17 12:02 PM  Result Value Ref Range Status   Specimen Description LEFT ANTECUBITAL  Final   Special Requests   Final    BOTTLES DRAWN AEROBIC AND ANAEROBIC Blood Culture adequate volume   Culture   Final    NO GROWTH 5 DAYS Performed at [facility], [address], [place], [place] [address]    Report Status 08/10/2017 FINAL  Final  Respiratory Panel by PCR     Status: None   Collection Time: 08/05/17 12:37 PM  Result Value Ref Range Status   Adenovirus NOT DETECTED NOT DETECTED Final    Coronavirus 229E NOT DETECTED NOT DETECTED Final   Coronavirus HKU1 NOT DETECTED NOT DETECTED Final   Coronavirus NL63 NOT DETECTED NOT DETECTED Final   Coronavirus OC43 NOT DETECTED NOT DETECTED Final   Metapneumovirus NOT DETECTED NOT DETECTED Final   Rhinovirus / Enterovirus NOT DETECTED NOT DETECTED Final   Influenza A NOT DETECTED NOT DETECTED Final   Influenza B NOT DETECTED NOT DETECTED Final   Parainfluenza Virus 1 NOT DETECTED NOT DETECTED Final   Parainfluenza Virus 2 NOT DETECTED NOT DETECTED Final   Parainfluenza Virus 3 NOT DETECTED NOT DETECTED Final   Parainfluenza Virus 4 NOT DETECTED NOT DETECTED Final   Respiratory Syncytial Virus NOT DETECTED NOT DETECTED Final   Bordetella pertussis NOT DETECTED NOT DETECTED Final   Chlamydophila pneumoniae NOT DETECTED NOT DETECTED Final   Mycoplasma pneumoniae NOT DETECTED NOT DETECTED Final    Comment: Performed at [facility], [place] [address], [place], [place] [address]  MRSA PCR Screening     Status: None   Collection Time: 08/10/17  8:35 AM  Result Value Ref Range Status   MRSA by PCR NEGATIVE NEGATIVE Final    Comment:        The GeneXpert MRSA Assay (FDA approved for NASAL specimens only), is one component of a comprehensive MRSA colonization surveillance program. It is not intended to diagnose MRSA infection nor to guide or monitor treatment for MRSA infections. Performed at [facility], [address], [place], [place] [address]          Radiology Studies: Dg Chest [facility] 1 View  Result Date: 08/12/2017 CLINICAL DATA:  Respiratory failure with shortness of breath EXAM: PORTABLE CHEST 1 VIEW COMPARISON:  Yesterday FINDINGS: Endotracheal tube tip between the clavicular heads and carina. Orogastric tube with tip at the GE junction. Cardiopericardial enlargement. There is diffuse hazy opacity of the chest with probable pleural fluid greater on the left. No visible pneumothorax. Artifact from EKG leads. These  results will be called to the ordering clinician or representative by the Radiologist Assistant, and communication documented in the PACS or zVision Dashboard. IMPRESSION: 1. Orogastric tube tip at the GE junction. Consider ~5 cm advancement into the stomach. 2. CHF pattern similar to yesterday. Electronically Signed   By: Monte Fantasia M.D.   On: 08/12/2017 07:52   Dg Chest Port 1 View  Result Date: 08/11/2017 CLINICAL DATA:  Respiratory failure EXAM: PORTABLE CHEST 1 VIEW COMPARISON:  08/10/2017 FINDINGS: Cardiac shadow remains enlarged. Aortic  calcifications are again seen. Endotracheal tube and nasogastric catheter are noted in satisfactory position. Diffuse airspace opacities are again identified bilaterally with some improved aeration in the left upper lobe. No bony abnormality is seen. IMPRESSION: Slight improved aeration particularly on the left. Electronically Signed   By: Inez Catalina M.D.   On: 08/11/2017 07:54        Scheduled Meds: . acetylcysteine  3 mL Nebulization Q4H  . amiodarone  200 mg Oral q morning - 10a  . apixaban  5 mg Oral BID  . chlorhexidine gluconate (MEDLINE KIT)  15 mL Mouth Rinse BID  . dextromethorphan  30 mg Oral BID  . DULoxetine  30 mg Oral BID  . furosemide  40 mg Oral Daily  . gabapentin  400 mg Oral BID  . gabapentin  800 mg Oral QHS  . insulin aspart  0-20 Units Subcutaneous Q4H  . insulin glargine  55 Units Subcutaneous QHS  . ipratropium-albuterol  3 mL Nebulization Q4H  . ketoconazole   Topical BID  . levETIRAcetam  1,000 mg Oral BID  . levothyroxine  137 mcg Oral QAC breakfast  . magnesium oxide  400 mg Oral Daily  . mouth rinse  15 mL Mouth Rinse QID  . methylPREDNISolone (SOLU-MEDROL) injection  40 mg Intravenous Q12H  . multivitamin with minerals  1 tablet Oral Daily  . nystatin   Topical TID  . pantoprazole (PROTONIX) IV  40 mg Intravenous Q24H  . potassium chloride  20 mEq Oral Daily   Continuous Infusions: . ceFEPime (MAXIPIME) IV  Stopped (08/12/17 0855)  . feeding supplement (VITAL HIGH PROTEIN) 1,000 mL (08/11/17 1404)  . fentaNYL infusion INTRAVENOUS 400 mcg/hr (08/12/17 1036)  . midazolam (VERSED) infusion 4 mg/hr (08/12/17 1038)  . potassium chloride 10 mEq (08/12/17 1123)     LOS: 7 days    Time spent: critical care time: 30 minutes    Kathie Dike, MD [facility] Pager [phone]  If 7PM-7AM, please contact night-coverage [email or web address] Password [email or web address] 08/12/2017, 11:26 AM

## 2017-08-13 ENCOUNTER — Inpatient Hospital Stay (HOSPITAL_COMMUNITY): Payer: Medicare Other

## 2017-08-13 LAB — COMPREHENSIVE METABOLIC PANEL
ALBUMIN: 2.4 g/dL — AB (ref 3.5–5.0)
ALK PHOS: 40 U/L (ref 38–126)
ALT: 19 U/L (ref 14–54)
ANION GAP: 10 (ref 5–15)
AST: 21 U/L (ref 15–41)
BUN: 43 mg/dL — ABNORMAL HIGH (ref 6–20)
CALCIUM: 8.6 mg/dL — AB (ref 8.9–10.3)
CHLORIDE: 104 mmol/L (ref 101–111)
CO2: 29 mmol/L (ref 22–32)
Creatinine, Ser: 0.89 mg/dL (ref 0.44–1.00)
GFR calc Af Amer: >60 mL/min (ref >60)
GFR calc non Af Amer: >60 mL/min (ref >60)
GLUCOSE: 268 mg/dL — AB (ref 65–99)
Potassium: 3.6 mmol/L (ref 3.5–5.1)
SODIUM: 143 mmol/L (ref 135–145)
Total Bilirubin: 0.7 mg/dL (ref 0.3–1.2)
Total Protein: 6.1 g/dL — ABNORMAL LOW (ref 6.5–8.1)

## 2017-08-13 LAB — CBC
HCT: 33.4 % — ABNORMAL LOW (ref 36.0–46.0)
HEMOGLOBIN: 10.3 g/dL — AB (ref 12.0–15.0)
MCH: 31.6 pg (ref 26.0–34.0)
MCHC: 30.8 g/dL (ref 30.0–36.0)
MCV: 102.5 fL — ABNORMAL HIGH (ref 78.0–100.0)
PLATELETS: 190 10*3/uL (ref 150–400)
RBC: 3.26 MIL/uL — AB (ref 3.87–5.11)
RDW: 15.2 % (ref 11.5–15.5)
WBC: 10.2 10*3/uL (ref 4.0–10.5)

## 2017-08-13 LAB — BLOOD GAS, ARTERIAL
ACID-BASE EXCESS: 5 mmol/L — AB (ref 0.0–2.0)
Bicarbonate: 28.9 mmol/L — ABNORMAL HIGH (ref 20.0–28.0)
DRAWN BY: 105551
FIO2: 55
MECHVT: 520 mL
Mechanical Rate: 16
O2 SAT: 97.3 %
PCO2 ART: 40.5 mmHg (ref 32.0–48.0)
PEEP/CPAP: 5 cmH2O
PH ART: 7.464 — AB (ref 7.350–7.450)
PO2 ART: 103 mmHg (ref 83.0–108.0)
RATE: 16 resp/min

## 2017-08-13 LAB — GLUCOSE, CAPILLARY
GLUCOSE-CAPILLARY: 167 mg/dL — AB (ref 65–99)
GLUCOSE-CAPILLARY: 266 mg/dL — AB (ref 65–99)
Glucose-Capillary: 290 mg/dL — ABNORMAL HIGH (ref 65–99)
Glucose-Capillary: 194 mg/dL — ABNORMAL HIGH (ref 65–99)
Glucose-Capillary: 326 mg/dL — ABNORMAL HIGH (ref 65–99)
Glucose-Capillary: 290 mg/dL — ABNORMAL HIGH (ref 65–99)

## 2017-08-13 MED ORDER — FENTANYL CITRATE (PF) 100 MCG/2ML IJ SOLN
100.0000 ug | Freq: Once | INTRAMUSCULAR | Status: AC
  Administered 2017-08-13: 100 ug via INTRAVENOUS

## 2017-08-13 MED ORDER — LEVOTHYROXINE SODIUM 100 MCG IV SOLR
75.0000 ug | Freq: Every day | INTRAVENOUS | Status: DC
  Administered 2017-08-14 – 2017-08-22 (×9): 75 ug via INTRAVENOUS
  Filled 2017-08-13 (×9): qty 5

## 2017-08-13 MED ORDER — MIDAZOLAM HCL 2 MG/2ML IJ SOLN
2.0000 mg | Freq: Once | INTRAMUSCULAR | Status: AC
  Administered 2017-08-13: 2 mg via INTRAVENOUS

## 2017-08-13 MED ORDER — FENTANYL CITRATE (PF) 2500 MCG/50ML IJ SOLN
INTRAMUSCULAR | Status: AC
  Filled 2017-08-13: qty 50

## 2017-08-13 MED ORDER — GABAPENTIN 250 MG/5ML PO SOLN
400.0000 mg | ORAL | Status: DC
  Administered 2017-08-14 – 2017-08-22 (×15): 400 mg via ORAL
  Filled 2017-08-13 (×32): qty 8

## 2017-08-13 MED ORDER — LOPERAMIDE HCL 1 MG/5ML PO LIQD
2.0000 mg | Freq: Two times a day (BID) | ORAL | Status: DC | PRN
  Administered 2017-08-14 – 2017-08-17 (×4): 2 mg via ORAL
  Filled 2017-08-13 (×5): qty 10

## 2017-08-13 MED ORDER — MIDAZOLAM 50MG/50ML (1MG/ML) PREMIX INFUSION
INTRAVENOUS | Status: AC
Start: 2017-08-13 — End: 2017-08-13
  Filled 2017-08-13: qty 50

## 2017-08-13 MED ORDER — ACETAMINOPHEN 160 MG/5ML PO SOLN: 650.0000 mg | Freq: Four times a day (QID) | ORAL | Status: DC | PRN

## 2017-08-13 MED ORDER — ADULT MULTIVITAMIN LIQUID CH
15.0000 mL | Freq: Every day | ORAL | Status: DC
  Administered 2017-08-14 – 2017-08-22 (×9): 15 mL via ORAL
  Filled 2017-08-13 (×10): qty 15

## 2017-08-13 MED ORDER — SODIUM CHLORIDE 0.9% FLUSH: 10.0000 mL | INTRAVENOUS | Status: DC | PRN

## 2017-08-13 MED ORDER — POTASSIUM CHLORIDE 20 MEQ/15ML (10%) PO SOLN
20.0000 meq | Freq: Every day | ORAL | Status: DC
  Administered 2017-08-13 – 2017-08-19 (×7): 20 meq via ORAL
  Filled 2017-08-13 (×7): qty 30

## 2017-08-13 MED ORDER — LEVETIRACETAM 100 MG/ML PO SOLN
1000.0000 mg | Freq: Two times a day (BID) | ORAL | Status: DC
  Administered 2017-08-13 – 2017-08-22 (×17): 1000 mg via ORAL
  Filled 2017-08-13 (×27): qty 10

## 2017-08-13 MED ORDER — GABAPENTIN 250 MG/5ML PO SOLN
800.0000 mg | Freq: Every day | ORAL | Status: DC
  Administered 2017-08-13 – 2017-08-21 (×9): 800 mg via ORAL
  Filled 2017-08-13 (×13): qty 16

## 2017-08-13 MED ORDER — SODIUM CHLORIDE 0.9% FLUSH
10.0000 mL | Freq: Two times a day (BID) | INTRAVENOUS | Status: DC
  Administered 2017-08-13 – 2017-08-22 (×18): 10 mL

## 2017-08-13 MED ORDER — CHLORHEXIDINE GLUCONATE CLOTH 2 % EX PADS
6.0000 | MEDICATED_PAD | Freq: Every day | CUTANEOUS | Status: DC
  Administered 2017-08-13 – 2017-08-22 (×10): 6 via TOPICAL

## 2017-08-13 NOTE — Progress Notes (Signed)
Subjective: She remains intubated and on the mechanical ventilator.  Chest x-ray this morning is pending.  She is having a little more issue with being agitated and she is pretty well maxed out on fentanyl and Versed.  However when we had her on propofol she had significant problems maintaining her blood pressure.  Objective: Vital signs in last 24 hours: Temp:  [98.4 F (36.9 C)-100.5 F (38.1 C)] 98.5 F (36.9 C) (02/10 0809) Pulse Rate:  [50-66] 51 (02/10 0800) Resp:  [14-21] 18 (02/10 0800) BP: (74-147)/(51-111) 112/53 (02/10 0800) SpO2:  [90 %-98 %] 95 % (02/10 0800) FiO2 (%):  [50 %-55 %] 50 % (02/10 0500) Weight:  [106.2 kg (234 lb 2.1 oz)] 106.2 kg (234 lb 2.1 oz) (02/10 0500) Weight change: 1.7 kg (3 lb 12 oz) Last BM Date: 08/11/17  Intake/Output from previous day: 02/09 0701 - 02/10 0700 In: 2450 [I.V.:1020; NG/GT:1330; IV Piggyback:100] Out: 1330 [Urine:1300; Emesis/NG output:30]  PHYSICAL EXAM General appearance: Intubated sedated moving around a little bit in the bed Resp: She has bilateral rhonchi and some rales in the left base Cardio: regular rate and rhythm, S1, S2 normal, no murmur, click, rub or gallop GI: soft, non-tender; bowel sounds normal; no masses,  no organomegaly Extremities: extremities normal, atraumatic, no cyanosis or edema Skin warm and dry  Lab Results:  Results for orders placed or performed during the hospital encounter of 08/05/17 (from the past 48 hour(s))  Glucose, capillary     Status: Abnormal   Collection Time: 08/11/17 11:41 AM  Result Value Ref Range   Glucose-Capillary 303 (H) 65 - 99 mg/dL   Comment 1 Notify RN    Comment 2 Document in Chart   Glucose, capillary     Status: Abnormal   Collection Time: 08/11/17  3:54 PM  Result Value Ref Range   Glucose-Capillary 242 (H) 65 - 99 mg/dL   Comment 1 Notify RN    Comment 2 Document in Chart   Glucose, capillary     Status: Abnormal   Collection Time: 08/11/17  8:40 PM  Result  Value Ref Range   Glucose-Capillary 332 (H) 65 - 99 mg/dL   Comment 1 Notify RN    Comment 2 Document in Chart   Glucose, capillary     Status: Abnormal   Collection Time: 08/11/17 11:56 PM  Result Value Ref Range   Glucose-Capillary 293 (H) 65 - 99 mg/dL   Comment 1 Notify RN    Comment 2 Document in Chart   Glucose, capillary     Status: Abnormal   Collection Time: 08/12/17 12:50 AM  Result Value Ref Range   Glucose-Capillary 299 (H) 65 - 99 mg/dL  CBC     Status: Abnormal   Collection Time: 08/12/17  4:25 AM  Result Value Ref Range   WBC 8.1 4.0 - 10.5 K/uL   RBC 3.22 (L) 3.87 - 5.11 MIL/uL   Hemoglobin 10.1 (L) 12.0 - 15.0 g/dL   HCT 32.6 (L) 36.0 - 46.0 %   MCV 101.2 (H) 78.0 - 100.0 fL   MCH 31.4 26.0 - 34.0 pg   MCHC 31.0 30.0 - 36.0 g/dL   RDW 14.7 11.5 - 15.5 %   Platelets 175 150 - 400 K/uL    Comment: Performed at [facility], [address] [place], [place] [address]  Comprehensive metabolic panel     Status: Abnormal   Collection Time: 08/12/17  4:25 AM  Result Value Ref Range   Sodium 139  135 - 145 mmol/L   Potassium 2.8 (L) 3.5 - 5.1 mmol/L    Comment: DELTA CHECK NOTED   Chloride 100 (L) 101 - 111 mmol/L   CO2 26 22 - 32 mmol/L   Glucose, Bld 291 (H) 65 - 99 mg/dL   BUN 38 (H) 6 - 20 mg/dL   Creatinine, Ser 0.85 0.44 - 1.00 mg/dL   Calcium 7.9 (L) 8.9 - 10.3 mg/dL   Total Protein 5.8 (L) 6.5 - 8.1 g/dL   Albumin 2.3 (L) 3.5 - 5.0 g/dL   AST 25 15 - 41 U/L   ALT 19 14 - 54 U/L   Alkaline Phosphatase 44 38 - 126 U/L   Total Bilirubin 0.4 0.3 - 1.2 mg/dL   GFR calc non Af Amer >60 >60 mL/min   GFR calc Af Amer >60 >60 mL/min    Comment: (NOTE) The eGFR has been calculated using the CKD EPI equation. This calculation has not been validated in all clinical situations. eGFR's persistently <60 mL/min signify possible Chronic Kidney Disease.    Anion gap 13 5 - 15    Comment: Performed at [facility], [address] [place], [place] [address]   Triglycerides     Status: None   Collection Time: 08/12/17  4:25 AM  Result Value Ref Range   Triglycerides 130 <150 mg/dL    Comment: Performed at [facility], [address] [place], [place] [address]  Magnesium     Status: None   Collection Time: 08/12/17  4:25 AM  Result Value Ref Range   Magnesium 2.0 1.7 - 2.4 mg/dL    Comment: Performed at [facility], [address] [place], [place] [address]  Glucose, capillary     Status: Abnormal   Collection Time: 08/12/17  5:04 AM  Result Value Ref Range   Glucose-Capillary 304 (H) 65 - 99 mg/dL  Blood gas, arterial     Status: Abnormal   Collection Time: 08/12/17  5:10 AM  Result Value Ref Range   FIO2 65.00    Delivery systems VENTILATOR    Mode PRESSURE REGULATED VOLUME CONTROL    VT 520 mL   LHR 20 resp/min   Peep/cpap 5.0 cm H20   pH, Arterial 7.499 (H) 7.350 - 7.450   pCO2 arterial 38.6 32.0 - 48.0 mmHg   pO2, Arterial 75.8 (L) 83.0 - 108.0 mmHg   Bicarbonate 30.3 (H) 20.0 - 28.0 mmol/L   Acid-Base Excess 6.4 (H) 0.0 - 2.0 mmol/L   O2 Saturation 94.9 %   Collection site BRACHIAL ARTERY    Drawn by [id number]    Sample type ARTERIAL    Mechanical Rate 20     Comment: Performed at [facility], [address] [place], [place] [address]  Glucose, capillary     Status: Abnormal   Collection Time: 08/12/17  7:51 AM  Result Value Ref Range   Glucose-Capillary 290 (H) 65 - 99 mg/dL  Glucose, capillary     Status: Abnormal   Collection Time: 08/12/17 11:14 AM  Result Value Ref Range   Glucose-Capillary 285 (H) 65 - 99 mg/dL  Glucose, capillary     Status: Abnormal   Collection Time: 08/12/17  4:43 PM  Result Value Ref Range   Glucose-Capillary 302 (H) 65 - 99 mg/dL  Glucose, capillary     Status: Abnormal   Collection Time: 08/12/17  7:49 PM  Result Value Ref Range   Glucose-Capillary 315 (H) 65 - 99 mg/dL  Glucose, capillary  Status: Abnormal   Collection Time: 08/12/17 11:50 PM  Result Value Ref Range    Glucose-Capillary 356 (H) 65 - 99 mg/dL  Glucose, capillary     Status: Abnormal   Collection Time: 08/13/17  4:05 AM  Result Value Ref Range   Glucose-Capillary 290 (H) 65 - 99 mg/dL  CBC     Status: Abnormal   Collection Time: 08/13/17  4:37 AM  Result Value Ref Range   WBC 10.2 4.0 - 10.5 K/uL   RBC 3.26 (L) 3.87 - 5.11 MIL/uL   Hemoglobin 10.3 (L) 12.0 - 15.0 g/dL   HCT 33.4 (L) 36.0 - 46.0 %   MCV 102.5 (H) 78.0 - 100.0 fL   MCH 31.6 26.0 - 34.0 pg   MCHC 30.8 30.0 - 36.0 g/dL   RDW 15.2 11.5 - 15.5 %   Platelets 190 150 - 400 K/uL    Comment: Performed at [facility], [address], [place], [place] [address]  Comprehensive metabolic panel     Status: Abnormal   Collection Time: 08/13/17  4:37 AM  Result Value Ref Range   Sodium 143 135 - 145 mmol/L   Potassium 3.6 3.5 - 5.1 mmol/L    Comment: DELTA CHECK NOTED   Chloride 104 101 - 111 mmol/L   CO2 29 22 - 32 mmol/L   Glucose, Bld 268 (H) 65 - 99 mg/dL   BUN 43 (H) 6 - 20 mg/dL   Creatinine, Ser 0.89 0.44 - 1.00 mg/dL   Calcium 8.6 (L) 8.9 - 10.3 mg/dL   Total Protein 6.1 (L) 6.5 - 8.1 g/dL   Albumin 2.4 (L) 3.5 - 5.0 g/dL   AST 21 15 - 41 U/L   ALT 19 14 - 54 U/L   Alkaline Phosphatase 40 38 - 126 U/L   Total Bilirubin 0.7 0.3 - 1.2 mg/dL   GFR calc non Af Amer >60 >60 mL/min   GFR calc Af Amer >60 >60 mL/min    Comment: (NOTE) The eGFR has been calculated using the CKD EPI equation. This calculation has not been validated in all clinical situations. eGFR's persistently <60 mL/min signify possible Chronic Kidney Disease.    Anion gap 10 5 - 15    Comment: Performed at [facility], [address], [place], [place] [address]  Blood gas, arterial     Status: Abnormal   Collection Time: 08/13/17  5:15 AM  Result Value Ref Range   FIO2 55.00    Delivery systems VENTILATOR    Mode PRESSURE REGULATED VOLUME CONTROL    VT 520 mL   LHR 16 resp/min   Peep/cpap 5.0 cm H20   pH, Arterial 7.464 (H) 7.350 - 7.450    pCO2 arterial 40.5 32.0 - 48.0 mmHg   pO2, Arterial 103 83.0 - 108.0 mmHg   Bicarbonate 28.9 (H) 20.0 - 28.0 mmol/L   Acid-Base Excess 5.0 (H) 0.0 - 2.0 mmol/L   O2 Saturation 97.3 %   Collection site BRACHIAL ARTERY    Drawn by [id number]    Sample type ARTERIAL    Allens test (pass/fail) NOT INDICATED (A) PASS   Mechanical Rate 16     Comment: Performed at [facility], [address] [place], [place] [address]  Glucose, capillary     Status: Abnormal   Collection Time: 08/13/17  7:54 AM  Result Value Ref Range   Glucose-Capillary 194 (H) 65 - 99 mg/dL    ABGS Recent Labs    08/13/17 0515  PHART  7.464*  PO2ART 103  HCO3 28.9*   CULTURES Recent Results (from the past 240 hour(s))  Culture, blood (routine x 2) Call MD if unable to obtain prior to antibiotics being given     Status: None   Collection Time: 08/05/17 12:02 PM  Result Value Ref Range Status   Specimen Description LEFT ANTECUBITAL  Final   Special Requests   Final    BOTTLES DRAWN AEROBIC AND ANAEROBIC Blood Culture adequate volume   Culture   Final    NO GROWTH 5 DAYS Performed at [facility], [address], [place], [place] [address]    Report Status 08/10/2017 FINAL  Final  Respiratory Panel by PCR     Status: None   Collection Time: 08/05/17 12:37 PM  Result Value Ref Range Status   Adenovirus NOT DETECTED NOT DETECTED Final   Coronavirus 229E NOT DETECTED NOT DETECTED Final   Coronavirus HKU1 NOT DETECTED NOT DETECTED Final   Coronavirus NL63 NOT DETECTED NOT DETECTED Final   Coronavirus OC43 NOT DETECTED NOT DETECTED Final   Metapneumovirus NOT DETECTED NOT DETECTED Final   Rhinovirus / Enterovirus NOT DETECTED NOT DETECTED Final   Influenza A NOT DETECTED NOT DETECTED Final   Influenza B NOT DETECTED NOT DETECTED Final   Parainfluenza Virus 1 NOT DETECTED NOT DETECTED Final   Parainfluenza Virus 2 NOT DETECTED NOT DETECTED Final   Parainfluenza Virus 3 NOT DETECTED NOT DETECTED Final    Parainfluenza Virus 4 NOT DETECTED NOT DETECTED Final   Respiratory Syncytial Virus NOT DETECTED NOT DETECTED Final   Bordetella pertussis NOT DETECTED NOT DETECTED Final   Chlamydophila pneumoniae NOT DETECTED NOT DETECTED Final   Mycoplasma pneumoniae NOT DETECTED NOT DETECTED Final    Comment: Performed at [facility], [place] [address] [place], [place] [address]  MRSA PCR Screening     Status: None   Collection Time: 08/10/17  8:35 AM  Result Value Ref Range Status   MRSA by PCR NEGATIVE NEGATIVE Final    Comment:        The GeneXpert MRSA Assay (FDA approved for NASAL specimens only), is one component of a comprehensive MRSA colonization surveillance program. It is not intended to diagnose MRSA infection nor to guide or monitor treatment for MRSA infections. Performed at [facility], [address], [place], [place] [address]    Studies/Results: Dg Chest Port 1 View  Result Date: 08/12/2017 CLINICAL DATA:  Respiratory failure with shortness of breath EXAM: PORTABLE CHEST 1 VIEW COMPARISON:  Yesterday FINDINGS: Endotracheal tube tip between the clavicular heads and carina. Orogastric tube with tip at the GE junction. Cardiopericardial enlargement. There is diffuse hazy opacity of the chest with probable pleural fluid greater on the left. No visible pneumothorax. Artifact from EKG leads. These results will be called to the ordering clinician or representative by the Radiologist Assistant, and communication documented in the PACS or zVision Dashboard. IMPRESSION: 1. Orogastric tube tip at the GE junction. Consider ~5 cm advancement into the stomach. 2. CHF pattern similar to yesterday. Electronically Signed   By: Monte Fantasia M.D.   On: 08/12/2017 07:52   Dg Chest Port 1v Same Day  Result Date: 08/12/2017 CLINICAL DATA:  OG tube replacement. EXAM: PORTABLE CHEST 1 VIEW COMPARISON:  08/12/2017 FINDINGS: An endotracheal tube is identified with 2 cm above the carina. And OG tube  enters the stomach with tip off the field of view. Cardiomegaly, pulmonary vascular congestion, right basilar atelectasis and left lower lung consolidation/atelectasis  again noted. There is no evidence of pneumothorax. IMPRESSION: Support apparatus as described. Otherwise unchanged appearance of the chest. Electronically Signed   By: Margarette Canada M.D.   On: 08/12/2017 11:40    Medications:  Prior to Admission:  Medications Prior to Admission  Medication Sig Dispense Refill Last Dose  . acetaminophen (TYLENOL) 500 MG tablet Take 500 mg by mouth every 6 (six) hours as needed for moderate pain or headache.   08/05/2017 at 0630  . albuterol (PROVENTIL HFA;VENTOLIN HFA) 108 (90 Base) MCG/ACT inhaler Inhale 2 puffs into the lungs every 4 (four) hours as needed for wheezing or shortness of breath. 1 Inhaler 0 08/04/2017 at Unknown time  . amiodarone (PACERONE) 200 MG tablet Take 1 tablet (200 mg total) by mouth every morning. 90 tablet 3 08/05/2017 at Unknown time  . amoxicillin (AMOXIL) 500 MG capsule Take 1 capsule by mouth 3 (three) times daily.  0 08/05/2017 at 0700  . apixaban (ELIQUIS) 5 MG TABS tablet Take 1 tablet (5 mg total) by mouth 2 (two) times daily. 60 tablet 0 08/05/2017 at Unknown time  . Cyanocobalamin (B-12) 5000 MCG SUBL Take 1 tablet by mouth daily.  12 08/05/2017 at 0700  . dicyclomine (BENTYL) 10 MG capsule TAKE 1 TO 2 CAPSULES BY MOUTH BEFORE meals AS NEEDED 180 capsule 3 08/04/2017 at Unknown time  . DULoxetine (CYMBALTA) 30 MG capsule Take 30 mg by mouth 2 (two) times daily.   08/05/2017 at 0700  . famotidine (PEPCID) 20 MG tablet Take 20 mg by mouth at bedtime.    08/04/2017 at Unknown time  . furosemide (LASIX) 40 MG tablet TAKE 40 MG DAILY. 90 tablet 3 08/05/2017 at Unknown time  . gabapentin (NEURONTIN) 400 MG capsule Take 400-800 mg by mouth 3 (three) times daily. Take 1 capsule(426m) by mouth twice a day and take 2 capsules(8065m by mouth at bedtime  11 08/05/2017 at 0700  . glipiZIDE  (GLUCOTROL) 10 MG tablet Take 10 mg by mouth 2 (two) times daily before a meal.    08/05/2017 at Unknown time  . Insulin Glargine (LANTUS SOLOSTAR) 100 UNIT/ML Solostar Pen Inject 20-50 Units into the skin at bedtime as needed. Patient takes 20 units every night at bedtime if over 178.  Takes 50 units if her blood sugar is above 300.   08/04/2017 at 2200  . levETIRAcetam (KEPPRA) 1000 MG tablet Take 1,000 mg by mouth 2 (two) times daily.  1 08/05/2017 at 0700  . levothyroxine (SYNTHROID, LEVOTHROID) 137 MCG tablet Take 137 mcg by mouth daily.   08/05/2017 at 0700  . loperamide (IMODIUM) 2 MG capsule TAKE ONE CAPSULE BY MOUTH TWICE DAILY AS NEEDED 60 capsule 3 08/04/2017 at Unknown time  . LORazepam (ATIVAN) 1 MG tablet Take 1 mg by mouth 3 (three) times daily as needed for anxiety or sleep. For anxiety   08/04/2017 at 2200  . magnesium oxide (MAG-OX) 400 MG tablet Take 400 mg by mouth daily.    08/05/2017 at Unknown time  . metaxalone (SKELAXIN) 800 MG tablet Take 800 mg by mouth 3 (three) times daily.    08/05/2017 at 0700  . metFORMIN (GLUCOPHAGE) 1000 MG tablet Take 1,000 mg by mouth 2 (two) times daily with a meal.    08/05/2017 at 0700  . Multiple Vitamin (MULTIVITAMIN WITH MINERALS) TABS tablet Take 1 tablet by mouth daily.   08/05/2017 at 0700  . nitroGLYCERIN (NITROSTAT) 0.4 MG SL tablet Place 0.4 mg under the tongue every 5 (five)  minutes as needed for chest pain. Reported on 07/08/2015   Taking  . ondansetron (ZOFRAN) 4 MG tablet TAKE ONE TABLET BY MOUTH FOUR TIMES DAILY FOR NAUSEA (Patient taking differently: TAKE ONE TABLET BY MOUTH FOUR TIMES DAILY FOR NAUSEA. Takes as needed) 120 tablet 3 Past Week at Unknown time  . pantoprazole (PROTONIX) 40 MG tablet TAKE ONE TABLET BY MOUTH TWICE DAILY BEFORE APPLY MEAL 180 tablet 3 08/05/2017 at 0700  . potassium chloride (K-DUR,KLOR-CON) 10 MEQ tablet TAKE TWO TABLETS BY MOUTH TWICE DAILY 120 tablet 11 08/05/2017 at 0700  . promethazine (PHENERGAN) 12.5 MG tablet Take 1  tablet (12.5 mg total) by mouth every 4 (four) hours as needed for nausea or vomiting. 42 tablet 0 Taking  . UNABLE TO FIND Take 1 tablet by mouth 2 (two) times daily. Medication Name: [employer] RX Multivitamin Diabetes Support  12 08/05/2017 at 0700   Scheduled: . acetylcysteine  3 mL Nebulization Q4H  . amiodarone  200 mg Oral q morning - 10a  . apixaban  5 mg Oral BID  . chlorhexidine gluconate (MEDLINE KIT)  15 mL Mouth Rinse BID  . dextromethorphan  30 mg Oral BID  . DULoxetine  30 mg Oral BID  . furosemide  40 mg Oral Daily  . gabapentin  400 mg Oral BID  . gabapentin  800 mg Oral QHS  . insulin aspart  0-20 Units Subcutaneous Q4H  . insulin glargine  55 Units Subcutaneous QHS  . ipratropium-albuterol  3 mL Nebulization Q4H  . ketoconazole   Topical BID  . levETIRAcetam  1,000 mg Oral BID  . levothyroxine  137 mcg Oral QAC breakfast  . magnesium oxide  400 mg Oral Daily  . mouth rinse  15 mL Mouth Rinse QID  . methylPREDNISolone (SOLU-MEDROL) injection  40 mg Intravenous Q12H  . multivitamin with minerals  1 tablet Oral Daily  . nystatin   Topical TID  . pantoprazole (PROTONIX) IV  40 mg Intravenous Q24H  . potassium chloride  20 mEq Oral Daily   Continuous: . ceFEPime (MAXIPIME) IV 1 g (08/13/17 0906)  . feeding supplement (VITAL HIGH PROTEIN) 1,000 mL (08/12/17 1655)  . fentaNYL infusion INTRAVENOUS 400 mcg/hr (08/13/17 0024)  . midazolam (VERSED) infusion 4 mg/hr (08/12/17 2349)   [id number], albuterol, fentaNYL, fentaNYL (SUBLIMAZE) injection, loperamide, midazolam, nitroGLYCERIN, ondansetron (ZOFRAN) IV  Assesment: She was admitted with community-acquired pneumonia that had failed outpatient treatment.  She developed worsening air hunger did not tolerate BiPAP and had trouble maintaining oxygen saturation and had near white out of her left lung which culminated in her being intubated and placed on mechanical ventilation.  This is day 5 of mechanical ventilation.   She had required very high FiO2 but we have been able to reduce that substantially over the last 48 hours and she is now on 50% with oxygen saturation in the mid 90s.  She is having a little more trouble with agitation and we will see what happens with that.  She is close to maxing out on her medications for agitation.  She has COPD exacerbation which is being treated with steroids and inhaled bronchodilators.  She has some element of congestive heart failure but that does not seem to be active now.  She has had episodes of atrial fib and she is chronically anticoagulate but no evidence of bleeding  She apparently has some dementia at baseline which may be part of the problem with her agitation but I have not seen her when  she was not having some distress or intubated to know how bad her dementia is.  She has sleep apnea at baseline Principal Problem:   CAP Failed Outpatient Tx Active Problems:   Hypothyroidism   Hyperlipidemia   Essential hypertension   Atrial fibrillation (HCC)   Congestive heart failure (HCC)   Gastroparesis   Sleep apnea   Chronic anticoagulation   Tremor   Dementia   DM type 2 (diabetes mellitus, type 2) (HCC)   Pulmonary hypertension (HCC)   COPD exacerbation (HCC)   Acute respiratory failure with hypercapnia ([place])    Plan: Continue treatments.  The first full weaning attempt in the morning    LOS: 8 days   Stephania Macfarlane L 08/13/2017, 9:23 AM

## 2017-08-13 NOTE — Progress Notes (Signed)
PROGRESS NOTE    [name]  LNL:[id number] DOB: June 14, 1951 DOA: 08/05/2017 PCP: Lemmie Evens, MD    Brief Narrative:  [name] a 67 y.o.femalewith multiple medical comorbidities detailed below presented to the emergency department with persistent cough and shortness of breath for the past 2 weeks that failed outpatient management with 2 courses of oral amoxicillin. Patient reports that she is on her second round of amoxicillin and continues to feel short of breath. She has nonproductive cough. She has some wheezing. She has no chest pain. The patient has no swelling in the extremities. She has a congestive heart failure that is chronic and she is taking her diuretics regularly. She denies palpitations. She presented to the ED and noted to be tachypneic. She was afebrile. A chest x-ray was obtained that did show a large left retro-cardiac opacity that was suspicious for pneumonia. EKG was unremarkable except for notation of prolonged PR interval. Her troponin was 0.07. She had a mildly elevated BNP at 189. The patient's blood sugar was elevated at 236. She was given treatment in the emergency department but remained tachypneic. She is going to be admitted for community-acquired pneumonia that has failed outpatient treatment.  Assessment & Plan:   Principal Problem:   CAP Failed Outpatient Tx Active Problems:   Hypothyroidism   Hyperlipidemia   Essential hypertension   Atrial fibrillation (HCC)   Congestive heart failure (HCC)   Gastroparesis   Sleep apnea   Chronic anticoagulation   Tremor   Dementia   DM type 2 (diabetes mellitus, type 2) (HCC)   Pulmonary hypertension (HCC)   COPD exacerbation (HCC)   Acute respiratory failure with hypercapnia ([place])   1. Acute respiratory failure with hypoxia and hypercapnia.  Related to progressive pneumonia.  She also has underlying COPD.  Patient was initially tried on BiPAP, but continued to be very short of breath  and did not have any significant improvement.  She required intubation with mechanical ventilation on 2/6.  Pulmonology following. FiO2 down to5 50% today.  Continues to slowly improve. She was having low grade fever overnight. This may be related to atelectasis. Would continue to monitor. Continue to wean down oxygen as tolerated.  Continue current treatments 2. Community-acquired pneumonia.  Initially started on ceftriaxone and azithromycin.  Chest x-ray had shown progressive worsening of pneumonia.  Antibiotics were changed to vancomycin and cefepime.  Follow-up chest x-rays have shown progressive improvement.  MRSA PCR is negative so vancomycin was discontinued.  Continue on cefepime. 3. COPD.  No wheezing. On steroids and bronchodilators. 4. Chronic diastolic heart failure.  Stable.  Appears compensated at this time.  Currently on oral Lasix.  Monitor renal function. 5. Hypertension.  Blood pressures currently stable.  Continue to monitor 6. Type 2 diabetes.  On basal insulin as well as sliding scale.  Blood sugars are stable. Continue on lantus and sliding scale. 7. Paroxysmal atrial fibrillation.  Currently on amiodarone.  Heart rate is stable. Continue current treatments.  Anticoagulated with Eliquis. 8. Hypothyroidism.  Stable.  Continue on Synthroid 9. Seizure disorder.  Stable.  No evidence of seizures. Continue on Keppra    DVT prophylaxis: apixiban Code Status: full code Family Communication: 2/9 discussed with daughters at the bedside Disposition Plan: pending hospital course, may need placement   Consultants:   pulmonology  Procedures:   Intubation 2/6>  Antimicrobials:   Vancomycin 2/5> 2/8  Cefepime 2/5>  Ceftriaxone 2/2>2/5  Azithromycin 2/2>2/5   Subjective: Sedated on vent  Objective:  Vitals:   08/13/17 0700 08/13/17 0800 08/13/17 0809 08/13/17 0935  BP: (!) 110/56 (!) 112/53    Pulse: (!) 52 (!) 51    Resp: 16 18    Temp:   98.5 F (36.9 C)     TempSrc:   Axillary   SpO2: 95% 95%  96%  Weight:      Height:        Intake/Output Summary (Last 24 hours) at 08/13/2017 1113 Last data filed at 08/13/2017 0700 Gross per 24 hour  Intake 2040 ml  Output 1330 ml  Net 710 ml   Filed Weights   08/11/17 0500 08/12/17 0400 08/13/17 0500  Weight: 105 kg (231 lb 7.7 oz) 104.5 kg (230 lb 6.1 oz) 106.2 kg (234 lb 2.1 oz)    Examination:  General exam: intubated and sedated Respiratory system: bilateral rhonchi. Respiratory effort normal. Cardiovascular system:RRR. No murmurs, rubs, gallops. Gastrointestinal system: Abdomen is nondistended, soft and nontender. No organomegaly or masses felt. Normal bowel sounds heard. Central nervous system: sedated. No obvious gross deficits. Moving extremities spontaneously Extremities: RLE (chronic) Skin: No rashes, lesions or ulcers Psychiatry: sedated     Data Reviewed: I have personally reviewed following labs and imaging studies  CBC: Recent Labs  Lab 08/09/17 1128 08/10/17 0357 08/11/17 0547 08/12/17 0425 08/13/17 0437  WBC 6.4 4.5 7.5 8.1 10.2  HGB 11.4* 11.0* 11.0* 10.1* 10.3*  HCT 37.2 35.3* 35.9* 32.6* 33.4*  MCV 103.3* 101.7* 101.1* 101.2* 102.5*  PLT 158 166 186 175 315   Basic Metabolic Panel: Recent Labs  Lab 08/07/17 0624 08/08/17 0427 08/09/17 1128 08/10/17 0357 08/11/17 0547 08/12/17 0425 08/13/17 0437  NA 137 137 140 140 139 139 143  K 4.6 4.2 3.6 3.6 3.4* 2.8* 3.6  CL 93* 93* 95* 96* 97* 100* 104  CO2 '31 29 31 31 31 26 29  ' GLUCOSE 280* 253* 282* 391* 300* 291* 268*  BUN 17 19 21* 35* 43* 38* 43*  CREATININE 0.90 0.84 0.95 0.90 1.00 0.85 0.89  CALCIUM 8.8* 8.8* 8.9 8.8* 8.9 7.9* 8.6*  MG 1.9 2.0  --   --   --  2.0  --    GFR: Estimated Creatinine Clearance: 77.2 mL/min (by C-G formula based on SCr of 0.89 mg/dL). Liver Function Tests: Recent Labs  Lab 08/09/17 1128 08/10/17 0357 08/11/17 0547 08/12/17 0425 08/13/17 0437  AST 38 31 34 25 21  ALT  '21 21 20 19 19  ' ALKPHOS 50 47 49 44 40  BILITOT 1.0 0.9 0.7 0.4 0.7  PROT 6.8 6.5 6.6 5.8* 6.1*  ALBUMIN 3.0* 2.8* 2.6* 2.3* 2.4*   No results for input(s): LIPASE, AMYLASE in the last 168 hours. No results for input(s): AMMONIA in the last 168 hours. Coagulation Profile: No results for input(s): INR, PROTIME in the last 168 hours. Cardiac Enzymes: No results for input(s): CKTOTAL, CKMB, CKMBINDEX, TROPONINI in the last 168 hours. BNP (last 3 results) No results for input(s): PROBNP in the last 8760 hours. HbA1C: No results for input(s): HGBA1C in the last 72 hours. CBG: Recent Labs  Lab 08/12/17 1643 08/12/17 1949 08/12/17 2350 08/13/17 0405 08/13/17 0754  GLUCAP 302* 315* 356* 290* 194*   Lipid Profile: Recent Labs    08/12/17 0425  TRIG 130   Thyroid Function Tests: No results for input(s): TSH, T4TOTAL, FREET4, T3FREE, THYROIDAB in the last 72 hours. Anemia Panel: No results for input(s): VITAMINB12, FOLATE, FERRITIN, TIBC, IRON, RETICCTPCT in the last 72 hours. Sepsis Labs:  No results for input(s): PROCALCITON, LATICACIDVEN in the last 168 hours.  Recent Results (from the past 240 hour(s))  Culture, blood (routine x 2) Call MD if unable to obtain prior to antibiotics being given     Status: None   Collection Time: 08/05/17 12:02 PM  Result Value Ref Range Status   Specimen Description LEFT ANTECUBITAL  Final   Special Requests   Final    BOTTLES DRAWN AEROBIC AND ANAEROBIC Blood Culture adequate volume   Culture   Final    NO GROWTH 5 DAYS Performed at [facility], [address], [place], [place] [address]    Report Status 08/10/2017 FINAL  Final  Respiratory Panel by PCR     Status: None   Collection Time: 08/05/17 12:37 PM  Result Value Ref Range Status   Adenovirus NOT DETECTED NOT DETECTED Final   Coronavirus 229E NOT DETECTED NOT DETECTED Final   Coronavirus HKU1 NOT DETECTED NOT DETECTED Final   Coronavirus NL63 NOT DETECTED NOT DETECTED Final    Coronavirus OC43 NOT DETECTED NOT DETECTED Final   Metapneumovirus NOT DETECTED NOT DETECTED Final   Rhinovirus / Enterovirus NOT DETECTED NOT DETECTED Final   Influenza A NOT DETECTED NOT DETECTED Final   Influenza B NOT DETECTED NOT DETECTED Final   Parainfluenza Virus 1 NOT DETECTED NOT DETECTED Final   Parainfluenza Virus 2 NOT DETECTED NOT DETECTED Final   Parainfluenza Virus 3 NOT DETECTED NOT DETECTED Final   Parainfluenza Virus 4 NOT DETECTED NOT DETECTED Final   Respiratory Syncytial Virus NOT DETECTED NOT DETECTED Final   Bordetella pertussis NOT DETECTED NOT DETECTED Final   Chlamydophila pneumoniae NOT DETECTED NOT DETECTED Final   Mycoplasma pneumoniae NOT DETECTED NOT DETECTED Final    Comment: Performed at [facility], [place] [address], [place], [place] [address]  MRSA PCR Screening     Status: None   Collection Time: 08/10/17  8:35 AM  Result Value Ref Range Status   MRSA by PCR NEGATIVE NEGATIVE Final    Comment:        The GeneXpert MRSA Assay (FDA approved for NASAL specimens only), is one component of a comprehensive MRSA colonization surveillance program. It is not intended to diagnose MRSA infection nor to guide or monitor treatment for MRSA infections. Performed at [facility], [address] [place], [place] [address]          Radiology Studies: Dg Chest [facility] 1 View  Result Date: 08/13/2017 CLINICAL DATA:  Respiratory failure EXAM: PORTABLE CHEST 1 VIEW COMPARISON:  08/12/2017 and prior radiographs FINDINGS: Endotracheal tube 2.5 cm above the carina, NG tube entering the stomach with tip off the field of view again noted. Cardiomegaly and bilateral interstitial/airspace opacities, left-greater-than-right, are not significantly changed. Left lower lung consolidation/atelectasis again noted. There is no evidence of pneumothorax. IMPRESSION: Unchanged appearance of the chest with continued bilateral interstitial/airspace opacities/edema and left lower  lung consolidation/atelectasis. Electronically Signed   By: Margarette Canada M.D.   On: 08/13/2017 10:00   Dg Chest Port 1 View  Result Date: 08/12/2017 CLINICAL DATA:  Respiratory failure with shortness of breath EXAM: PORTABLE CHEST 1 VIEW COMPARISON:  Yesterday FINDINGS: Endotracheal tube tip between the clavicular heads and carina. Orogastric tube with tip at the GE junction. Cardiopericardial enlargement. There is diffuse hazy opacity of the chest with probable pleural fluid greater on the left. No visible pneumothorax. Artifact from EKG leads. These results will be called to the ordering clinician or representative by the  Psychologist, clinical, and communication documented in the PACS or zVision Dashboard. IMPRESSION: 1. Orogastric tube tip at the GE junction. Consider ~5 cm advancement into the stomach. 2. CHF pattern similar to yesterday. Electronically Signed   By: Monte Fantasia M.D.   On: 08/12/2017 07:52   Dg Chest Port 1v Same Day  Result Date: 08/12/2017 CLINICAL DATA:  OG tube replacement. EXAM: PORTABLE CHEST 1 VIEW COMPARISON:  08/12/2017 FINDINGS: An endotracheal tube is identified with 2 cm above the carina. And OG tube enters the stomach with tip off the field of view. Cardiomegaly, pulmonary vascular congestion, right basilar atelectasis and left lower lung consolidation/atelectasis again noted. There is no evidence of pneumothorax. IMPRESSION: Support apparatus as described. Otherwise unchanged appearance of the chest. Electronically Signed   By: Margarette Canada M.D.   On: 08/12/2017 11:40        Scheduled Meds: . acetylcysteine  3 mL Nebulization Q4H  . amiodarone  200 mg Oral q morning - 10a  . apixaban  5 mg Oral BID  . chlorhexidine gluconate (MEDLINE KIT)  15 mL Mouth Rinse BID  . dextromethorphan  30 mg Oral BID  . DULoxetine  30 mg Oral BID  . furosemide  40 mg Oral Daily  . gabapentin  400 mg Oral BID  . gabapentin  800 mg Oral QHS  . insulin aspart  0-20 Units  Subcutaneous Q4H  . insulin glargine  55 Units Subcutaneous QHS  . ipratropium-albuterol  3 mL Nebulization Q4H  . ketoconazole   Topical BID  . levETIRAcetam  1,000 mg Oral BID  . levothyroxine  137 mcg Oral QAC breakfast  . magnesium oxide  400 mg Oral Daily  . mouth rinse  15 mL Mouth Rinse QID  . methylPREDNISolone (SOLU-MEDROL) injection  40 mg Intravenous Q12H  . multivitamin with minerals  1 tablet Oral Daily  . nystatin   Topical TID  . pantoprazole (PROTONIX) IV  40 mg Intravenous Q24H  . potassium chloride  20 mEq Oral Daily   Continuous Infusions: . ceFEPime (MAXIPIME) IV Stopped (08/13/17 0936)  . feeding supplement (VITAL HIGH PROTEIN) 1,000 mL (08/12/17 1655)  . fentaNYL infusion INTRAVENOUS 400 mcg/hr (08/13/17 0024)  . midazolam (VERSED) infusion 4 mg/hr (08/12/17 2349)     LOS: 8 days    Time spent: critical care time: 30 mins    Kathie Dike, MD [facility] Pager [phone]  If 7PM-7AM, please contact night-coverage [email or web address] Password [email or web address] 08/13/2017, 11:13 AM

## 2017-08-13 NOTE — Progress Notes (Signed)
Have backed off Rate to 16 will see if her PH is more normal range in am.

## 2017-08-13 NOTE — Progress Notes (Signed)
Dr. Juanetta Gosling wanted to hold off on weaning trial today and try Monday.

## 2017-08-14 ENCOUNTER — Other Ambulatory Visit (HOSPITAL_COMMUNITY): Payer: Medicare Other

## 2017-08-14 ENCOUNTER — Ambulatory Visit (HOSPITAL_COMMUNITY): Payer: Medicare Other | Admitting: Internal Medicine

## 2017-08-14 ENCOUNTER — Inpatient Hospital Stay (HOSPITAL_COMMUNITY): Payer: Medicare Other

## 2017-08-14 LAB — BLOOD GAS, ARTERIAL
Acid-Base Excess: 3.7 mmol/L — ABNORMAL HIGH (ref 0.0–2.0)
Bicarbonate: 27.1 mmol/L (ref 20.0–28.0)
Drawn by: 105551
FIO2: 50
MECHANICAL RATE: 12
MECHVT: 520 mL
O2 Saturation: 94.7 %
PEEP: 5 cmH2O
PH ART: 7.352 (ref 7.350–7.450)
RATE: 12 resp/min
pCO2 arterial: 53.4 mmHg — ABNORMAL HIGH (ref 32.0–48.0)
pO2, Arterial: 82.9 mmHg — ABNORMAL LOW (ref 83.0–108.0)

## 2017-08-14 LAB — GLUCOSE, CAPILLARY
GLUCOSE-CAPILLARY: 389 mg/dL — AB (ref 65–99)
GLUCOSE-CAPILLARY: 241 mg/dL — AB (ref 65–99)
GLUCOSE-CAPILLARY: 295 mg/dL — AB (ref 65–99)
Glucose-Capillary: 359 mg/dL — ABNORMAL HIGH (ref 65–99)
Glucose-Capillary: 302 mg/dL — ABNORMAL HIGH (ref 65–99)
Glucose-Capillary: 191 mg/dL — ABNORMAL HIGH (ref 65–99)

## 2017-08-14 LAB — BASIC METABOLIC PANEL
ANION GAP: 10 (ref 5–15)
BUN: 42 mg/dL — AB (ref 6–20)
CALCIUM: 8.7 mg/dL — AB (ref 8.9–10.3)
CO2: 29 mmol/L (ref 22–32)
Chloride: 102 mmol/L (ref 101–111)
Creatinine, Ser: 0.88 mg/dL (ref 0.44–1.00)
GFR calc Af Amer: >60 mL/min (ref >60)
GFR calc non Af Amer: >60 mL/min (ref >60)
GLUCOSE: 377 mg/dL — AB (ref 65–99)
Potassium: 4.4 mmol/L (ref 3.5–5.1)
Sodium: 141 mmol/L (ref 135–145)

## 2017-08-14 LAB — CBC
HEMATOCRIT: 33.6 % — AB (ref 36.0–46.0)
Hemoglobin: 10 g/dL — ABNORMAL LOW (ref 12.0–15.0)
MCH: 31.2 pg (ref 26.0–34.0)
MCHC: 29.8 g/dL — AB (ref 30.0–36.0)
MCV: 104.7 fL — AB (ref 78.0–100.0)
Platelets: 163 10*3/uL (ref 150–400)
RBC: 3.21 MIL/uL — ABNORMAL LOW (ref 3.87–5.11)
RDW: 14.8 % (ref 11.5–15.5)
WBC: 12.3 10*3/uL — AB (ref 4.0–10.5)

## 2017-08-14 MED ORDER — INSULIN ASPART 100 UNIT/ML ~~LOC~~ SOLN
6.0000 [IU] | SUBCUTANEOUS | Status: DC
  Administered 2017-08-14 – 2017-08-15 (×6): 6 [IU] via SUBCUTANEOUS

## 2017-08-14 MED ORDER — SODIUM CHLORIDE 0.9 % IV SOLN
1.0000 g | Freq: Three times a day (TID) | INTRAVENOUS | Status: AC
  Administered 2017-08-14 – 2017-08-16 (×7): 1 g via INTRAVENOUS
  Filled 2017-08-14 (×9): qty 1

## 2017-08-14 NOTE — Progress Notes (Signed)
Subjective: She remains intubated and on the ventilator.  She was more agitated yesterday.  Objective: Vital signs in last 24 hours: Temp:  [98.2 F (36.8 C)-99.4 F (37.4 C)] 98.3 F (36.8 C) (02/11 0400) Pulse Rate:  [49-82] 82 (02/11 0615) Resp:  [12-26] 13 (02/11 0615) BP: (93-130)/(45-98) 121/45 (02/11 0615) SpO2:  [89 %-96 %] 95 % (02/11 0615) FiO2 (%):  [45 %-50 %] 45 % (02/11 0413) Weight:  [107 kg (235 lb 14.3 oz)] 107 kg (235 lb 14.3 oz) (02/11 0500) Weight change: 0.8 kg (1 lb 12.2 oz) Last BM Date: 08/13/17  Intake/Output from previous day: 02/10 0701 - 02/11 0700 In: 2382.9 [I.V.:917.9; NG/GT:1265; IV Piggyback:200] Out: 2000 [Urine:2000]  PHYSICAL EXAM General appearance: Intubated sedated on mechanical ventilation Resp: She still has rales in the left base and some rhonchi scattered throughout Cardio: regular rate and rhythm, S1, S2 normal, no murmur, click, rub or gallop GI: soft, non-tender; bowel sounds normal; no masses,  no organomegaly Extremities: extremities normal, atraumatic, no cyanosis or edema She is calm now  Lab Results:  Results for orders placed or performed during the hospital encounter of 08/05/17 (from the past 48 hour(s))  Glucose, capillary     Status: Abnormal   Collection Time: 08/12/17  7:51 AM  Result Value Ref Range   Glucose-Capillary 290 (H) 65 - 99 mg/dL  Glucose, capillary     Status: Abnormal   Collection Time: 08/12/17 11:14 AM  Result Value Ref Range   Glucose-Capillary 285 (H) 65 - 99 mg/dL  Glucose, capillary     Status: Abnormal   Collection Time: 08/12/17  4:43 PM  Result Value Ref Range   Glucose-Capillary 302 (H) 65 - 99 mg/dL  Glucose, capillary     Status: Abnormal   Collection Time: 08/12/17  7:49 PM  Result Value Ref Range   Glucose-Capillary 315 (H) 65 - 99 mg/dL  Glucose, capillary     Status: Abnormal   Collection Time: 08/12/17 11:50 PM  Result Value Ref Range   Glucose-Capillary 356 (H) 65 - 99 mg/dL   Glucose, capillary     Status: Abnormal   Collection Time: 08/13/17  4:05 AM  Result Value Ref Range   Glucose-Capillary 290 (H) 65 - 99 mg/dL  CBC     Status: Abnormal   Collection Time: 08/13/17  4:37 AM  Result Value Ref Range   WBC 10.2 4.0 - 10.5 K/uL   RBC 3.26 (L) 3.87 - 5.11 MIL/uL   Hemoglobin 10.3 (L) 12.0 - 15.0 g/dL   HCT 33.4 (L) 36.0 - 46.0 %   MCV 102.5 (H) 78.0 - 100.0 fL   MCH 31.6 26.0 - 34.0 pg   MCHC 30.8 30.0 - 36.0 g/dL   RDW 15.2 11.5 - 15.5 %   Platelets 190 150 - 400 K/uL    Comment: Performed at [facility], [address] [place], [place] [address]  Comprehensive metabolic panel     Status: Abnormal   Collection Time: 08/13/17  4:37 AM  Result Value Ref Range   Sodium 143 135 - 145 mmol/L   Potassium 3.6 3.5 - 5.1 mmol/L    Comment: DELTA CHECK NOTED   Chloride 104 101 - 111 mmol/L   CO2 29 22 - 32 mmol/L   Glucose, Bld 268 (H) 65 - 99 mg/dL   BUN 43 (H) 6 - 20 mg/dL   Creatinine, Ser 0.89 0.44 - 1.00 mg/dL   Calcium 8.6 (L) 8.9 - 10.3 mg/dL  Total Protein 6.1 (L) 6.5 - 8.1 g/dL   Albumin 2.4 (L) 3.5 - 5.0 g/dL   AST 21 15 - 41 U/L   ALT 19 14 - 54 U/L   Alkaline Phosphatase 40 38 - 126 U/L   Total Bilirubin 0.7 0.3 - 1.2 mg/dL   GFR calc non Af Amer >60 >60 mL/min   GFR calc Af Amer >60 >60 mL/min    Comment: (NOTE) The eGFR has been calculated using the CKD EPI equation. This calculation has not been validated in all clinical situations. eGFR's persistently <60 mL/min signify possible Chronic Kidney Disease.    Anion gap 10 5 - 15    Comment: Performed at [facility], [address], [place], [place] [address]  Blood gas, arterial     Status: Abnormal   Collection Time: 08/13/17  5:15 AM  Result Value Ref Range   FIO2 55.00    Delivery systems VENTILATOR    Mode PRESSURE REGULATED VOLUME CONTROL    VT 520 mL   LHR 16 resp/min   Peep/cpap 5.0 cm H20   pH, Arterial 7.464 (H) 7.350 - 7.450   pCO2 arterial 40.5 32.0 - 48.0 mmHg    pO2, Arterial 103 83.0 - 108.0 mmHg   Bicarbonate 28.9 (H) 20.0 - 28.0 mmol/L   Acid-Base Excess 5.0 (H) 0.0 - 2.0 mmol/L   O2 Saturation 97.3 %   Collection site BRACHIAL ARTERY    Drawn by [id number]    Sample type ARTERIAL    Allens test (pass/fail) NOT INDICATED (A) PASS   Mechanical Rate 16     Comment: Performed at [facility], [address], [place], [place] [address]  Glucose, capillary     Status: Abnormal   Collection Time: 08/13/17  7:54 AM  Result Value Ref Range   Glucose-Capillary 194 (H) 65 - 99 mg/dL  Glucose, capillary     Status: Abnormal   Collection Time: 08/13/17 11:48 AM  Result Value Ref Range   Glucose-Capillary 167 (H) 65 - 99 mg/dL  Glucose, capillary     Status: Abnormal   Collection Time: 08/13/17  5:13 PM  Result Value Ref Range   Glucose-Capillary 326 (H) 65 - 99 mg/dL  Glucose, capillary     Status: Abnormal   Collection Time: 08/13/17  7:58 PM  Result Value Ref Range   Glucose-Capillary 290 (H) 65 - 99 mg/dL  Glucose, capillary     Status: Abnormal   Collection Time: 08/13/17 11:23 PM  Result Value Ref Range   Glucose-Capillary 266 (H) 65 - 99 mg/dL  Glucose, capillary     Status: Abnormal   Collection Time: 08/14/17  4:30 AM  Result Value Ref Range   Glucose-Capillary 359 (H) 65 - 99 mg/dL   Comment 1 Notify RN   CBC     Status: Abnormal   Collection Time: 08/14/17  4:34 AM  Result Value Ref Range   WBC 12.3 (H) 4.0 - 10.5 K/uL   RBC 3.21 (L) 3.87 - 5.11 MIL/uL   Hemoglobin 10.0 (L) 12.0 - 15.0 g/dL   HCT 33.6 (L) 36.0 - 46.0 %   MCV 104.7 (H) 78.0 - 100.0 fL   MCH 31.2 26.0 - 34.0 pg   MCHC 29.8 (L) 30.0 - 36.0 g/dL   RDW 14.8 11.5 - 15.5 %   Platelets 163 150 - 400 K/uL    Comment: Performed at [facility], [address], [place], [place] [address]  Basic metabolic panel  Status: Abnormal   Collection Time: 08/14/17  4:34 AM  Result Value Ref Range   Sodium 141 135 - 145 mmol/L   Potassium 4.4 3.5 - 5.1 mmol/L    Comment: DELTA  CHECK NOTED   Chloride 102 101 - 111 mmol/L   CO2 29 22 - 32 mmol/L   Glucose, Bld 377 (H) 65 - 99 mg/dL   BUN 42 (H) 6 - 20 mg/dL   Creatinine, Ser 0.88 0.44 - 1.00 mg/dL   Calcium 8.7 (L) 8.9 - 10.3 mg/dL   GFR calc non Af Amer >60 >60 mL/min   GFR calc Af Amer >60 >60 mL/min    Comment: (NOTE) The eGFR has been calculated using the CKD EPI equation. This calculation has not been validated in all clinical situations. eGFR's persistently <60 mL/min signify possible Chronic Kidney Disease.    Anion gap 10 5 - 15    Comment: Performed at [facility], [address] [place], [place] [address]  Blood gas, arterial     Status: Abnormal   Collection Time: 08/14/17  5:50 AM  Result Value Ref Range   FIO2 50.00    Delivery systems VENTILATOR    Mode PRESSURE REGULATED VOLUME CONTROL    VT 520 mL   LHR 12 resp/min   Peep/cpap 5.0 cm H20   pH, Arterial 7.352 7.350 - 7.450   pCO2 arterial 53.4 (H) 32.0 - 48.0 mmHg   pO2, Arterial 82.9 (L) 83.0 - 108.0 mmHg   Bicarbonate 27.1 20.0 - 28.0 mmol/L   Acid-Base Excess 3.7 (H) 0.0 - 2.0 mmol/L   O2 Saturation 94.7 %   Collection site RADIAL    Drawn by [id number]    Sample type ARTERIAL    Allens test (pass/fail) PASS PASS   Mechanical Rate 12     Comment: Performed at [facility], [address] [place], [place] [address]    ABGS Recent Labs    08/14/17 0550  PHART 7.352  PO2ART 82.9*  HCO3 27.1   CULTURES Recent Results (from the past 240 hour(s))  Culture, blood (routine x 2) Call MD if unable to obtain prior to antibiotics being given     Status: None   Collection Time: 08/05/17 12:02 PM  Result Value Ref Range Status   Specimen Description LEFT ANTECUBITAL  Final   Special Requests   Final    BOTTLES DRAWN AEROBIC AND ANAEROBIC Blood Culture adequate volume   Culture   Final    NO GROWTH 5 DAYS Performed at [facility], [address], [place], [place] [address]    Report Status 08/10/2017 FINAL  Final  Respiratory  Panel by PCR     Status: None   Collection Time: 08/05/17 12:37 PM  Result Value Ref Range Status   Adenovirus NOT DETECTED NOT DETECTED Final   Coronavirus 229E NOT DETECTED NOT DETECTED Final   Coronavirus HKU1 NOT DETECTED NOT DETECTED Final   Coronavirus NL63 NOT DETECTED NOT DETECTED Final   Coronavirus OC43 NOT DETECTED NOT DETECTED Final   Metapneumovirus NOT DETECTED NOT DETECTED Final   Rhinovirus / Enterovirus NOT DETECTED NOT DETECTED Final   Influenza A NOT DETECTED NOT DETECTED Final   Influenza B NOT DETECTED NOT DETECTED Final   Parainfluenza Virus 1 NOT DETECTED NOT DETECTED Final   Parainfluenza Virus 2 NOT DETECTED NOT DETECTED Final   Parainfluenza Virus 3 NOT DETECTED NOT DETECTED Final   Parainfluenza Virus 4 NOT DETECTED NOT DETECTED Final   Respiratory Syncytial  Virus NOT DETECTED NOT DETECTED Final   Bordetella pertussis NOT DETECTED NOT DETECTED Final   Chlamydophila pneumoniae NOT DETECTED NOT DETECTED Final   Mycoplasma pneumoniae NOT DETECTED NOT DETECTED Final    Comment: Performed at [facility], [place] [address], [place], [place] [address]  MRSA PCR Screening     Status: None   Collection Time: 08/10/17  8:35 AM  Result Value Ref Range Status   MRSA by PCR NEGATIVE NEGATIVE Final    Comment:        The GeneXpert MRSA Assay (FDA approved for NASAL specimens only), is one component of a comprehensive MRSA colonization surveillance program. It is not intended to diagnose MRSA infection nor to guide or monitor treatment for MRSA infections. Performed at [facility], [address], [place], [place] [address]    Studies/Results: Dg Chest Port 1 View  Result Date: 08/14/2017 CLINICAL DATA:  Respiratory failure EXAM: PORTABLE CHEST 1 VIEW COMPARISON:  Yesterday FINDINGS: Endotracheal tube tip is between the clavicular heads and carina. An orogastric tube and side-port reaches the stomach. Cardiopericardial enlargement and vascular pedicle  widening. There is diffuse haziness of the chest and interstitial opacities. IMPRESSION: 1. Unremarkable positioning of endotracheal and orogastric tubes. 2. CHF pattern.  Pneumonia and atelectasis could be superimposed. Electronically Signed   By: Monte Fantasia M.D.   On: 08/14/2017 06:48   Dg Chest Port 1 View  Result Date: 08/13/2017 CLINICAL DATA:  Respiratory failure EXAM: PORTABLE CHEST 1 VIEW COMPARISON:  08/12/2017 and prior radiographs FINDINGS: Endotracheal tube 2.5 cm above the carina, NG tube entering the stomach with tip off the field of view again noted. Cardiomegaly and bilateral interstitial/airspace opacities, left-greater-than-right, are not significantly changed. Left lower lung consolidation/atelectasis again noted. There is no evidence of pneumothorax. IMPRESSION: Unchanged appearance of the chest with continued bilateral interstitial/airspace opacities/edema and left lower lung consolidation/atelectasis. Electronically Signed   By: Margarette Canada M.D.   On: 08/13/2017 10:00   Dg Chest Port 1 View  Result Date: 08/12/2017 CLINICAL DATA:  Respiratory failure with shortness of breath EXAM: PORTABLE CHEST 1 VIEW COMPARISON:  Yesterday FINDINGS: Endotracheal tube tip between the clavicular heads and carina. Orogastric tube with tip at the GE junction. Cardiopericardial enlargement. There is diffuse hazy opacity of the chest with probable pleural fluid greater on the left. No visible pneumothorax. Artifact from EKG leads. These results will be called to the ordering clinician or representative by the Radiologist Assistant, and communication documented in the PACS or zVision Dashboard. IMPRESSION: 1. Orogastric tube tip at the GE junction. Consider ~5 cm advancement into the stomach. 2. CHF pattern similar to yesterday. Electronically Signed   By: Monte Fantasia M.D.   On: 08/12/2017 07:52   Dg Chest Port 1v Same Day  Result Date: 08/12/2017 CLINICAL DATA:  OG tube replacement. EXAM:  PORTABLE CHEST 1 VIEW COMPARISON:  08/12/2017 FINDINGS: An endotracheal tube is identified with 2 cm above the carina. And OG tube enters the stomach with tip off the field of view. Cardiomegaly, pulmonary vascular congestion, right basilar atelectasis and left lower lung consolidation/atelectasis again noted. There is no evidence of pneumothorax. IMPRESSION: Support apparatus as described. Otherwise unchanged appearance of the chest. Electronically Signed   By: Margarette Canada M.D.   On: 08/12/2017 11:40    Medications:  Prior to Admission:  Medications Prior to Admission  Medication Sig Dispense Refill Last Dose  . acetaminophen (TYLENOL) 500 MG tablet Take 500 mg by mouth every 6 (six)  hours as needed for moderate pain or headache.   08/05/2017 at 0630  . albuterol (PROVENTIL HFA;VENTOLIN HFA) 108 (90 Base) MCG/ACT inhaler Inhale 2 puffs into the lungs every 4 (four) hours as needed for wheezing or shortness of breath. 1 Inhaler 0 08/04/2017 at Unknown time  . amiodarone (PACERONE) 200 MG tablet Take 1 tablet (200 mg total) by mouth every morning. 90 tablet 3 08/05/2017 at Unknown time  . amoxicillin (AMOXIL) 500 MG capsule Take 1 capsule by mouth 3 (three) times daily.  0 08/05/2017 at 0700  . apixaban (ELIQUIS) 5 MG TABS tablet Take 1 tablet (5 mg total) by mouth 2 (two) times daily. 60 tablet 0 08/05/2017 at Unknown time  . Cyanocobalamin (B-12) 5000 MCG SUBL Take 1 tablet by mouth daily.  12 08/05/2017 at 0700  . dicyclomine (BENTYL) 10 MG capsule TAKE 1 TO 2 CAPSULES BY MOUTH BEFORE meals AS NEEDED 180 capsule 3 08/04/2017 at Unknown time  . DULoxetine (CYMBALTA) 30 MG capsule Take 30 mg by mouth 2 (two) times daily.   08/05/2017 at 0700  . famotidine (PEPCID) 20 MG tablet Take 20 mg by mouth at bedtime.    08/04/2017 at Unknown time  . furosemide (LASIX) 40 MG tablet TAKE 40 MG DAILY. 90 tablet 3 08/05/2017 at Unknown time  . gabapentin (NEURONTIN) 400 MG capsule Take 400-800 mg by mouth 3 (three) times daily.  Take 1 capsule(420m) by mouth twice a day and take 2 capsules(8010m by mouth at bedtime  11 08/05/2017 at 0700  . glipiZIDE (GLUCOTROL) 10 MG tablet Take 10 mg by mouth 2 (two) times daily before a meal.    08/05/2017 at Unknown time  . Insulin Glargine (LANTUS SOLOSTAR) 100 UNIT/ML Solostar Pen Inject 20-50 Units into the skin at bedtime as needed. Patient takes 20 units every night at bedtime if over 178.  Takes 50 units if her blood sugar is above 300.   08/04/2017 at 2200  . levETIRAcetam (KEPPRA) 1000 MG tablet Take 1,000 mg by mouth 2 (two) times daily.  1 08/05/2017 at 0700  . levothyroxine (SYNTHROID, LEVOTHROID) 137 MCG tablet Take 137 mcg by mouth daily.   08/05/2017 at 0700  . loperamide (IMODIUM) 2 MG capsule TAKE ONE CAPSULE BY MOUTH TWICE DAILY AS NEEDED 60 capsule 3 08/04/2017 at Unknown time  . LORazepam (ATIVAN) 1 MG tablet Take 1 mg by mouth 3 (three) times daily as needed for anxiety or sleep. For anxiety   08/04/2017 at 2200  . magnesium oxide (MAG-OX) 400 MG tablet Take 400 mg by mouth daily.    08/05/2017 at Unknown time  . metaxalone (SKELAXIN) 800 MG tablet Take 800 mg by mouth 3 (three) times daily.    08/05/2017 at 0700  . metFORMIN (GLUCOPHAGE) 1000 MG tablet Take 1,000 mg by mouth 2 (two) times daily with a meal.    08/05/2017 at 0700  . Multiple Vitamin (MULTIVITAMIN WITH MINERALS) TABS tablet Take 1 tablet by mouth daily.   08/05/2017 at 0700  . nitroGLYCERIN (NITROSTAT) 0.4 MG SL tablet Place 0.4 mg under the tongue every 5 (five) minutes as needed for chest pain. Reported on 07/08/2015   Taking  . ondansetron (ZOFRAN) 4 MG tablet TAKE ONE TABLET BY MOUTH FOUR TIMES DAILY FOR NAUSEA (Patient taking differently: TAKE ONE TABLET BY MOUTH FOUR TIMES DAILY FOR NAUSEA. Takes as needed) 120 tablet 3 Past Week at Unknown time  . pantoprazole (PROTONIX) 40 MG tablet TAKE ONE TABLET BY MOUTH TWICE DAILY BEFORE  APPLY MEAL 180 tablet 3 08/05/2017 at 0700  . potassium chloride (K-DUR,KLOR-CON) 10 MEQ tablet  TAKE TWO TABLETS BY MOUTH TWICE DAILY 120 tablet 11 08/05/2017 at 0700  . promethazine (PHENERGAN) 12.5 MG tablet Take 1 tablet (12.5 mg total) by mouth every 4 (four) hours as needed for nausea or vomiting. 42 tablet 0 Taking  . UNABLE TO FIND Take 1 tablet by mouth 2 (two) times daily. Medication Name: [employer] RX Multivitamin Diabetes Support  12 08/05/2017 at 0700   Scheduled: . acetylcysteine  3 mL Nebulization Q4H  . amiodarone  200 mg Oral q morning - 10a  . apixaban  5 mg Oral BID  . chlorhexidine gluconate (MEDLINE KIT)  15 mL Mouth Rinse BID  . Chlorhexidine Gluconate Cloth  6 each Topical Daily  . dextromethorphan  30 mg Oral BID  . furosemide  40 mg Oral Daily  . gabapentin  400 mg Oral 2 times per day  . gabapentin  800 mg Oral QHS  . insulin aspart  0-20 Units Subcutaneous Q4H  . insulin glargine  55 Units Subcutaneous QHS  . ipratropium-albuterol  3 mL Nebulization Q4H  . ketoconazole   Topical BID  . levETIRAcetam  1,000 mg Oral BID  . levothyroxine  75 mcg Intravenous Daily  . magnesium oxide  400 mg Oral Daily  . mouth rinse  15 mL Mouth Rinse QID  . methylPREDNISolone (SOLU-MEDROL) injection  40 mg Intravenous Q12H  . multivitamin  15 mL Oral Daily  . nystatin   Topical TID  . pantoprazole (PROTONIX) IV  40 mg Intravenous Q24H  . potassium chloride  20 mEq Oral Daily  . sodium chloride flush  10-40 mL Intracatheter Q12H   Continuous: . ceFEPime (MAXIPIME) IV Stopped (08/14/17 0128)  . feeding supplement (VITAL HIGH PROTEIN) 1,000 mL (08/13/17 1440)  . fentaNYL infusion INTRAVENOUS 200 mcg/hr (08/14/17 0658)  . midazolam (VERSED) infusion 3 mg/hr (08/14/17 0657)   ZJQ:BHALPFXTKWIOX (TYLENOL) oral liquid 160 mg/5 mL, albuterol, fentaNYL, fentaNYL (SUBLIMAZE) injection, loperamide, midazolam, nitroGLYCERIN, ondansetron (ZOFRAN) IV, sodium chloride flush  Assesment: He was admitted with community-acquired pneumonia that had failed outpatient treatment.  She had  extensive bilateral pneumonia and ended up requiring intubation and mechanical ventilation.  Her chest x-ray is clearing.  I personally reviewed the x-ray from this morning and I do not think it shows much change from yesterday but substantial improvement from last week.  She had required 100% oxygen and that she is now on 45% with good oxygenation.  At baseline she has COPD and that is being treated.  At baseline she has sleep apnea so she may require BiPAP even after she is extubated  She has diastolic heart failure but I do not think her problem now is cardiac.  She was very agitated before she was placed on the ventilator and that may adversely influence her ability to get her off ventilator support Principal Problem:   CAP Failed Outpatient Tx Active Problems:   Hypothyroidism   Hyperlipidemia   Essential hypertension   Atrial fibrillation (HCC)   Congestive heart failure (HCC)   Gastroparesis   Sleep apnea   Chronic anticoagulation   Tremor   Dementia   DM type 2 (diabetes mellitus, type 2) (HCC)   Pulmonary hypertension (HCC)   COPD exacerbation ([place])   Acute respiratory failure with hypercapnia ([place])    Plan: Try weaning today continue other treatments    LOS: 9 days   Malachi Kinzler L 08/14/2017, 6:57 AM

## 2017-08-14 NOTE — Progress Notes (Signed)
Inpatient Diabetes Program Recommendations  AACE/ADA: New Consensus Statement on Inpatient Glycemic Control (2015)  Target Ranges:  Prepandial:   less than 140 mg/dL      Peak postprandial:   less than 180 mg/dL (1-2 hours)      Critically ill patients:  140 - 180 mg/dL   Lab Results  Component Value Date   GLUCAP 389 (H) 08/14/2017   HGBA1C 7.4 (H) 02/04/2015    Review of Glycemic ControlResults for GENESSIS, FIELDER (MRN [id number]) as of 08/14/2017 10:18  Ref. Range 08/13/2017 17:13 08/13/2017 19:58 08/13/2017 23:23 08/14/2017 04:30 08/14/2017 07:38  Glucose-Capillary Latest Ref Range: 65 - 99 mg/dL 967 (H) 591 (H) 638 (H) 359 (H) 389 (H)   Diabetes history: Type 2 DM Outpatient Diabetes medications:Lantus 20 units QHS if glucose >178 mg/dl (or takes 50 units if glucose >300 mg/dl), Glipizide 10 mg BID, Metformin 1000 mg BID Current orders for Inpatient glycemic control:Lantus55units QHS,Novolog 0-20 unitsQ4H; Solumedrol 40 mg Q12H, Vital 55 ml/hr  Inpatient Diabetes Program Recommendations: Blood sugars remain increased.  Please add Novolog tube feed coverage 4 units q 4 hours (hold if patient eats less than 50%).   Thanks,  Beryl Meager, RN, BC-ADM Inpatient Diabetes Coordinator Pager [phone] (8a-5p)

## 2017-08-14 NOTE — Progress Notes (Signed)
PROGRESS NOTE    [name]  KGM:[id number] DOB: 08/22/1950 DOA: 08/05/2017 PCP: Lemmie Evens, MD    Brief Narrative:  [name] a 67 y.o.femalewith multiple medical comorbidities detailed below presented to the emergency department with persistent cough and shortness of breath for the past 2 weeks that failed outpatient management with 2 courses of oral amoxicillin. Patient reports that she is on her second round of amoxicillin and continues to feel short of breath. She has nonproductive cough. She has some wheezing. She has no chest pain. The patient has no swelling in the extremities. She has a congestive heart failure that is chronic and she is taking her diuretics regularly. She denies palpitations. She presented to the ED and noted to be tachypneic. She was afebrile. A chest x-ray was obtained that did show a large left retro-cardiac opacity that was suspicious for pneumonia. EKG was unremarkable except for notation of prolonged PR interval. Her troponin was 0.07. She had a mildly elevated BNP at 189. The patient's blood sugar was elevated at 236. She was given treatment in the emergency department but remained tachypneic. She is going to be admitted for community-acquired pneumonia that has failed outpatient treatment.  Assessment & Plan:   Principal Problem:   CAP Failed Outpatient Tx Active Problems:   Hypothyroidism   Hyperlipidemia   Essential hypertension   Atrial fibrillation (HCC)   Congestive heart failure (HCC)   Gastroparesis   Sleep apnea   Chronic anticoagulation   Tremor   Dementia   DM type 2 (diabetes mellitus, type 2) (HCC)   Pulmonary hypertension (HCC)   COPD exacerbation (HCC)   Acute respiratory failure with hypercapnia ([place])   1. Acute respiratory failure with hypoxia and hypercapnia.  Related to progressive pneumonia.  She also has underlying COPD.  Patient was initially tried on BiPAP, but continued to be very short of breath  and did not have any significant improvement.  She required intubation with mechanical ventilation on 2/6.  Pulmonology following. FiO2 down to5 50% today.  Continues to slowly improve. Weaning trial was attempted today, but patient became very agitated and had to go back on ventilator. Will continue current treatments and perform another weaning trial in AM. 2. Community-acquired pneumonia.  Initially started on ceftriaxone and azithromycin.  Chest x-ray had shown progressive worsening of pneumonia.  Antibiotics were changed to vancomycin and cefepime.  Follow-up chest x-rays have shown progressive improvement.  MRSA PCR is negative so vancomycin was discontinued.  Continue on cefepime. 3. COPD.  No wheezing. Continue on steroids and bronchodilators. 4. Chronic diastolic heart failure.  Stable.  Appears compensated at this time.  Currently on oral Lasix. Renal function has been stable. 5. Hypertension.  Blood pressures currently stable.  Continue to monitor 6. Type 2 diabetes.  On basal insulin as well as sliding scale.  Remain uncontrolled. Will add scheduled novolog q4h and continue on basal/SSI 7. Paroxysmal atrial fibrillation.  Currently on amiodarone.  Heart rate is stable. Continue current treatments.  Anticoagulated with Eliquis. 8. Hypothyroidism.  Stable.  Continue on Synthroid 9. Seizure disorder.  Stable.  No evidence of seizures. Continue on Keppra    DVT prophylaxis: apixiban Code Status: full code Family Communication: 2/9 discussed with daughters at the bedside Disposition Plan: pending hospital course, may need placement   Consultants:   pulmonology  Procedures:   Intubation 2/6>  Antimicrobials:   Vancomycin 2/5> 2/8  Cefepime 2/5>  Ceftriaxone 2/2>2/5  Azithromycin 2/2>2/5   Subjective: Sedated on  ventilator  Objective: Vitals:   08/14/17 1015 08/14/17 1030 08/14/17 1045 08/14/17 1117  BP: 137/77 (!) 148/103 130/87 125/65  Pulse: 93 82 80 66  Resp: (!)  '21 17 15 15  ' Temp:    98.4 F (36.9 C)  TempSrc:    Axillary  SpO2: 90% 95% 93% 94%  Weight:      Height:        Intake/Output Summary (Last 24 hours) at 08/14/2017 1149 Last data filed at 08/14/2017 0815 Gross per 24 hour  Intake 2432.93 ml  Output 2000 ml  Net 432.93 ml   Filed Weights   08/12/17 0400 08/13/17 0500 08/14/17 0500  Weight: 104.5 kg (230 lb 6.1 oz) 106.2 kg (234 lb 2.1 oz) 107 kg (235 lb 14.3 oz)    Examination:  General exam: intubated and sedated Respiratory system: left sided rhonchi, Respiratory effort normal. Cardiovascular system:RRR. No murmurs, rubs, gallops. Gastrointestinal system: Abdomen is nondistended, soft and nontender. No organomegaly or masses felt. Normal bowel sounds heard. Central nervous system: sedated. No focal neurological deficits. Extremities: No C/C/E, +pedal pulses Skin: No rashes, lesions or ulcers Psychiatry: sedated      Data Reviewed: I have personally reviewed following labs and imaging studies  CBC: Recent Labs  Lab 08/10/17 0357 08/11/17 0547 08/12/17 0425 08/13/17 0437 08/14/17 0434  WBC 4.5 7.5 8.1 10.2 12.3*  HGB 11.0* 11.0* 10.1* 10.3* 10.0*  HCT 35.3* 35.9* 32.6* 33.4* 33.6*  MCV 101.7* 101.1* 101.2* 102.5* 104.7*  PLT 166 186 175 190 854   Basic Metabolic Panel: Recent Labs  Lab 08/08/17 0427  08/10/17 0357 08/11/17 0547 08/12/17 0425 08/13/17 0437 08/14/17 0434  NA 137   < > 140 139 139 143 141  K 4.2   < > 3.6 3.4* 2.8* 3.6 4.4  CL 93*   < > 96* 97* 100* 104 102  CO2 29   < > '31 31 26 29 29  ' GLUCOSE 253*   < > 391* 300* 291* 268* 377*  BUN 19   < > 35* 43* 38* 43* 42*  CREATININE 0.84   < > 0.90 1.00 0.85 0.89 0.88  CALCIUM 8.8*   < > 8.8* 8.9 7.9* 8.6* 8.7*  MG 2.0  --   --   --  2.0  --   --    < > = values in this interval not displayed.   GFR: Estimated Creatinine Clearance: 78.3 mL/min (by C-G formula based on SCr of 0.88 mg/dL). Liver Function Tests: Recent Labs  Lab  08/09/17 1128 08/10/17 0357 08/11/17 0547 08/12/17 0425 08/13/17 0437  AST 38 31 34 25 21  ALT '21 21 20 19 19  ' ALKPHOS 50 47 49 44 40  BILITOT 1.0 0.9 0.7 0.4 0.7  PROT 6.8 6.5 6.6 5.8* 6.1*  ALBUMIN 3.0* 2.8* 2.6* 2.3* 2.4*   No results for input(s): LIPASE, AMYLASE in the last 168 hours. No results for input(s): AMMONIA in the last 168 hours. Coagulation Profile: No results for input(s): INR, PROTIME in the last 168 hours. Cardiac Enzymes: No results for input(s): CKTOTAL, CKMB, CKMBINDEX, TROPONINI in the last 168 hours. BNP (last 3 results) No results for input(s): PROBNP in the last 8760 hours. HbA1C: No results for input(s): HGBA1C in the last 72 hours. CBG: Recent Labs  Lab 08/13/17 1713 08/13/17 1958 08/13/17 2323 08/14/17 0430 08/14/17 0738  GLUCAP 326* 290* 266* 359* 389*   Lipid Profile: Recent Labs    08/12/17 0425  TRIG 130  Thyroid Function Tests: No results for input(s): TSH, T4TOTAL, FREET4, T3FREE, THYROIDAB in the last 72 hours. Anemia Panel: No results for input(s): VITAMINB12, FOLATE, FERRITIN, TIBC, IRON, RETICCTPCT in the last 72 hours. Sepsis Labs: No results for input(s): PROCALCITON, LATICACIDVEN in the last 168 hours.  Recent Results (from the past 240 hour(s))  Culture, blood (routine x 2) Call MD if unable to obtain prior to antibiotics being given     Status: None   Collection Time: 08/05/17 12:02 PM  Result Value Ref Range Status   Specimen Description LEFT ANTECUBITAL  Final   Special Requests   Final    BOTTLES DRAWN AEROBIC AND ANAEROBIC Blood Culture adequate volume   Culture   Final    NO GROWTH 5 DAYS Performed at [facility], [address], [place], [place] [address]    Report Status 08/10/2017 FINAL  Final  Respiratory Panel by PCR     Status: None   Collection Time: 08/05/17 12:37 PM  Result Value Ref Range Status   Adenovirus NOT DETECTED NOT DETECTED Final   Coronavirus 229E NOT DETECTED NOT DETECTED Final    Coronavirus HKU1 NOT DETECTED NOT DETECTED Final   Coronavirus NL63 NOT DETECTED NOT DETECTED Final   Coronavirus OC43 NOT DETECTED NOT DETECTED Final   Metapneumovirus NOT DETECTED NOT DETECTED Final   Rhinovirus / Enterovirus NOT DETECTED NOT DETECTED Final   Influenza A NOT DETECTED NOT DETECTED Final   Influenza B NOT DETECTED NOT DETECTED Final   Parainfluenza Virus 1 NOT DETECTED NOT DETECTED Final   Parainfluenza Virus 2 NOT DETECTED NOT DETECTED Final   Parainfluenza Virus 3 NOT DETECTED NOT DETECTED Final   Parainfluenza Virus 4 NOT DETECTED NOT DETECTED Final   Respiratory Syncytial Virus NOT DETECTED NOT DETECTED Final   Bordetella pertussis NOT DETECTED NOT DETECTED Final   Chlamydophila pneumoniae NOT DETECTED NOT DETECTED Final   Mycoplasma pneumoniae NOT DETECTED NOT DETECTED Final    Comment: Performed at [facility], [place] [address] [place], [place] [address]  MRSA PCR Screening     Status: None   Collection Time: 08/10/17  8:35 AM  Result Value Ref Range Status   MRSA by PCR NEGATIVE NEGATIVE Final    Comment:        The GeneXpert MRSA Assay (FDA approved for NASAL specimens only), is one component of a comprehensive MRSA colonization surveillance program. It is not intended to diagnose MRSA infection nor to guide or monitor treatment for MRSA infections. Performed at [facility], [address], [place], [place] [address]          Radiology Studies: Dg Chest [facility] 1 View  Result Date: 08/14/2017 CLINICAL DATA:  Respiratory failure EXAM: PORTABLE CHEST 1 VIEW COMPARISON:  Yesterday FINDINGS: Endotracheal tube tip is between the clavicular heads and carina. An orogastric tube and side-port reaches the stomach. Cardiopericardial enlargement and vascular pedicle widening. There is diffuse haziness of the chest and interstitial opacities. IMPRESSION: 1. Unremarkable positioning of endotracheal and orogastric tubes. 2. CHF pattern.  Pneumonia and  atelectasis could be superimposed. Electronically Signed   By: Monte Fantasia M.D.   On: 08/14/2017 06:48   Dg Chest Port 1 View  Result Date: 08/13/2017 CLINICAL DATA:  Respiratory failure EXAM: PORTABLE CHEST 1 VIEW COMPARISON:  08/12/2017 and prior radiographs FINDINGS: Endotracheal tube 2.5 cm above the carina, NG tube entering the stomach with tip off the field of view again noted. Cardiomegaly and bilateral interstitial/airspace opacities, left-greater-than-right, are not  significantly changed. Left lower lung consolidation/atelectasis again noted. There is no evidence of pneumothorax. IMPRESSION: Unchanged appearance of the chest with continued bilateral interstitial/airspace opacities/edema and left lower lung consolidation/atelectasis. Electronically Signed   By: Margarette Canada M.D.   On: 08/13/2017 10:00        Scheduled Meds: . acetylcysteine  3 mL Nebulization Q4H  . amiodarone  200 mg Oral q morning - 10a  . apixaban  5 mg Oral BID  . chlorhexidine gluconate (MEDLINE KIT)  15 mL Mouth Rinse BID  . Chlorhexidine Gluconate Cloth  6 each Topical Daily  . dextromethorphan  30 mg Oral BID  . furosemide  40 mg Oral Daily  . gabapentin  400 mg Oral 2 times per day  . gabapentin  800 mg Oral QHS  . insulin aspart  0-20 Units Subcutaneous Q4H  . insulin glargine  55 Units Subcutaneous QHS  . ipratropium-albuterol  3 mL Nebulization Q4H  . ketoconazole   Topical BID  . levETIRAcetam  1,000 mg Oral BID  . levothyroxine  75 mcg Intravenous Daily  . magnesium oxide  400 mg Oral Daily  . mouth rinse  15 mL Mouth Rinse QID  . methylPREDNISolone (SOLU-MEDROL) injection  40 mg Intravenous Q12H  . multivitamin  15 mL Oral Daily  . nystatin   Topical TID  . pantoprazole (PROTONIX) IV  40 mg Intravenous Q24H  . potassium chloride  20 mEq Oral Daily  . sodium chloride flush  10-40 mL Intracatheter Q12H   Continuous Infusions: . ceFEPime (MAXIPIME) IV    . feeding supplement (VITAL HIGH  PROTEIN) 1,000 mL (08/13/17 1440)  . fentaNYL infusion INTRAVENOUS 350 mcg/hr (08/14/17 1125)  . midazolam (VERSED) infusion 6 mg/hr (08/14/17 1039)     LOS: 9 days    Time spent: critical care time: 26mns    JKathie Dike MD [facility] Pager [phone] If 7PM-7AM, please contact night-coverage [email or web address] Password [email or web address]2/05/2018, 11:49 AM

## 2017-08-14 NOTE — Progress Notes (Signed)
Patient still not breathing over vent.

## 2017-08-14 NOTE — Progress Notes (Signed)
Have decreased rate to 12 , patient PH still high as of yesterday ABG, patient to start weaning in am, Saturation 96 on 45 FiO2 , patient has not started to breath on her own as yet.

## 2017-08-15 ENCOUNTER — Inpatient Hospital Stay (HOSPITAL_COMMUNITY): Payer: Medicare Other

## 2017-08-15 LAB — CBC
HCT: 34.6 % — ABNORMAL LOW (ref 36.0–46.0)
Hemoglobin: 10.3 g/dL — ABNORMAL LOW (ref 12.0–15.0)
MCH: 31.4 pg (ref 26.0–34.0)
MCHC: 29.8 g/dL — ABNORMAL LOW (ref 30.0–36.0)
MCV: 105.5 fL — ABNORMAL HIGH (ref 78.0–100.0)
PLATELETS: 198 10*3/uL (ref 150–400)
RBC: 3.28 MIL/uL — ABNORMAL LOW (ref 3.87–5.11)
RDW: 15.2 % (ref 11.5–15.5)
WBC: 14.5 10*3/uL — AB (ref 4.0–10.5)

## 2017-08-15 LAB — GLUCOSE, CAPILLARY
GLUCOSE-CAPILLARY: 260 mg/dL — AB (ref 65–99)
GLUCOSE-CAPILLARY: 285 mg/dL — AB (ref 65–99)
Glucose-Capillary: 321 mg/dL — ABNORMAL HIGH (ref 65–99)
Glucose-Capillary: 298 mg/dL — ABNORMAL HIGH (ref 65–99)
Glucose-Capillary: 333 mg/dL — ABNORMAL HIGH (ref 65–99)

## 2017-08-15 LAB — BLOOD GAS, ARTERIAL
ACID-BASE EXCESS: 4.4 mmol/L — AB (ref 0.0–2.0)
Acid-Base Excess: 3.6 mmol/L — ABNORMAL HIGH (ref 0.0–2.0)
Bicarbonate: 27.8 mmol/L (ref 20.0–28.0)
Bicarbonate: 26.4 mmol/L (ref 20.0–28.0)
Drawn by: 3177771
Drawn by: 270161
FIO2: 40
FIO2: 40
MECHVT: 520 mL
Mode: POSITIVE
O2 SAT: 94.7 %
O2 Saturation: 94.8 %
PATIENT TEMPERATURE: 37
PEEP/CPAP: 5 cmH2O
PEEP/CPAP: 5 cmH2O
PH ART: 7.38 (ref 7.350–7.450)
PO2 ART: 76.9 mmHg — AB (ref 83.0–108.0)
PRESSURE SUPPORT: 10 cmH2O
Patient temperature: 37
RATE: 12 resp/min
pCO2 arterial: 50.5 mmHg — ABNORMAL HIGH (ref 32.0–48.0)
pCO2 arterial: 63.8 mmHg — ABNORMAL HIGH (ref 32.0–48.0)
pH, Arterial: 7.29 — ABNORMAL LOW (ref 7.350–7.450)
pO2, Arterial: 85.2 mmHg (ref 83.0–108.0)

## 2017-08-15 LAB — BASIC METABOLIC PANEL
Anion gap: 9 (ref 5–15)
BUN: 43 mg/dL — AB (ref 6–20)
CO2: 29 mmol/L (ref 22–32)
CREATININE: 0.84 mg/dL (ref 0.44–1.00)
Calcium: 8.8 mg/dL — ABNORMAL LOW (ref 8.9–10.3)
Chloride: 105 mmol/L (ref 101–111)
GFR calc Af Amer: >60 mL/min (ref >60)
Glucose, Bld: 286 mg/dL — ABNORMAL HIGH (ref 65–99)
Potassium: 4.3 mmol/L (ref 3.5–5.1)
Sodium: 143 mmol/L (ref 135–145)

## 2017-08-15 LAB — TRIGLYCERIDES: Triglycerides: 95 mg/dL (ref <150)

## 2017-08-15 MED ORDER — MIDAZOLAM 50MG/50ML (1MG/ML) PREMIX INFUSION
INTRAVENOUS | Status: AC
  Filled 2017-08-15: qty 50

## 2017-08-15 MED ORDER — INSULIN ASPART 100 UNIT/ML ~~LOC~~ SOLN
8.0000 [IU] | SUBCUTANEOUS | Status: DC
  Administered 2017-08-15 – 2017-08-19 (×21): 8 [IU] via SUBCUTANEOUS

## 2017-08-15 NOTE — Progress Notes (Signed)
Pt weaned for 45 minutes on CP/PSV on 10/5 and 40%. She looked great as far as volumes and all vitals. Her RR was 20s. Drew an ABG for possible extubation. ABG was not within parameters. Placed patient back on full support. Pt tolerating well. Will attempt wean again in the A.M.

## 2017-08-15 NOTE — Progress Notes (Signed)
PROGRESS NOTE    [name]  RJJ:[id number] DOB: 11/23/1950 DOA: 08/05/2017 PCP: Lemmie Evens, MD    Brief Narrative:  CARLOYN [name] a 67 y.o.femalewith multiple medical comorbidities detailed below presented to the emergency department with persistent cough and shortness of breath for the past 2 weeks that failed outpatient management with 2 courses of oral amoxicillin. Patient reports that she is on her second round of amoxicillin and continues to feel short of breath. She has nonproductive cough. She has some wheezing. She has no chest pain. The patient has no swelling in the extremities. She has a congestive heart failure that is chronic and she is taking her diuretics regularly. She denies palpitations. She presented to the ED and noted to be tachypneic. She was afebrile. A chest x-ray was obtained that did show a large left retro-cardiac opacity that was suspicious for pneumonia. EKG was unremarkable except for notation of prolonged PR interval. Her troponin was 0.07. She had a mildly elevated BNP at 189. The patient's blood sugar was elevated at 236. She was given treatment in the emergency department but remained tachypneic.   She was admitted to the hospital and initially required BiPAP therapy, but then decompensated and required intubation.  She has been slow to progress.  Weaning trials have been complicated by development of agitation.  Pulmonology following.  Assessment & Plan:   Principal Problem:   CAP Failed Outpatient Tx Active Problems:   Hypothyroidism   Hyperlipidemia   Essential hypertension   Atrial fibrillation (HCC)   Congestive heart failure (HCC)   Gastroparesis   Sleep apnea   Chronic anticoagulation   Tremor   Dementia   DM type 2 (diabetes mellitus, type 2) (HCC)   Pulmonary hypertension (HCC)   COPD exacerbation (HCC)   Acute respiratory failure with hypercapnia ([place])   1. Acute respiratory failure with hypoxia and hypercapnia.   Related to progressive pneumonia.  She also has underlying COPD.  Patient was initially tried on BiPAP, but continued to be very short of breath and did not have any significant improvement.  She required intubation with mechanical ventilation on 2/6.  Pulmonology following. FiO2 down to 40% today.  Continues to slowly improve. Weaning trial was attempted yesterday, but patient became increasingly agitated. Will attempt another weaning trial today 2. Community-acquired pneumonia.  Initially started on ceftriaxone and azithromycin.  Chest x-ray initially had shown progressive worsening of pneumonia.  Antibiotics were changed to vancomycin and cefepime.  Follow-up chest x-rays have shown progressive improvement.  MRSA PCR was negative so vancomycin was discontinued.  Continue on cefepime. 3. COPD.  No wheezing. Continue on steroids and bronchodilators. 4. Chronic diastolic heart failure.  Stable.  Appears compensated at this time.  Currently on oral Lasix. Renal function has been stable. 5. Hypertension.  Blood pressures currently stable.  Continue to monitor 6. Type 2 diabetes.  On basal insulin as well as sliding scale.  Blood sugars remain elevated. Will increase novolog coverage. 7. Paroxysmal atrial fibrillation.  Currently on amiodarone. Heart rate is bradycardic, but I suspect this is due to sedation. Continue current treatments.  Anticoagulated with Eliquis. 8. Hypothyroidism.  Stable..  Continue on Synthroid 9. Seizure disorder. Stable. No evidence of seizures. Continue on Keppra    DVT prophylaxis: apixiban Code Status: full code Family Communication: 2/12 discussed with daughters at the bedside Disposition Plan: pending hospital course, may need placement   Consultants:   pulmonology  Procedures:   Intubation 2/6>  Antimicrobials:   Vancomycin  2/5> 2/8  Cefepime 2/5>  Ceftriaxone 2/2>2/5  Azithromycin 2/2>2/5   Subjective: Sedated on ventilator  Objective: Vitals:    08/15/17 0630 08/15/17 0645 08/15/17 0801 08/15/17 0955  BP: 122/61 121/66    Pulse: (!) 49 (!) 48 (!) 43   Resp: _0 Temp:   98.2 F (36.8 C)   TempSrc:   Axillary   SpO2: 94% 94% 92% 95%  Weight:      Height:        Intake/Output Summary (Last 24 hours) at 08/15/2017 1009 Last data filed at 08/15/2017 0952 Gross per 24 hour  Intake 2545.78 ml  Output 1750 ml  Net 795.78 ml   Filed Weights   08/13/17 0500 08/14/17 0500 08/15/17 0500  Weight: 106.2 kg (234 lb 2.1 oz) 107 kg (235 lb 14.3 oz) 108 kg (238 lb 1.6 oz)    Examination:  General exam: sedated on vent Respiratory system: bilateral rhonchi. Respiratory effort normal. Cardiovascular system: bradycardic. No murmurs, rubs, gallops. Gastrointestinal system: Abdomen is nondistended, soft and nontender. No organomegaly or masses felt. Normal bowel sounds heard. Central nervous system: sedated No focal neurological deficits. Extremities: No C/C/E, +pedal pulses Skin: No rashes, lesions or ulcers Psychiatry: sedated    Data Reviewed: I have personally reviewed following labs and imaging studies  CBC: Recent Labs  Lab 08/11/17 0547 08/12/17 0425 08/13/17 0437 08/14/17 0434 08/15/17 0431  WBC 7.5 8.1 10.2 12.3* 14.5*  HGB 11.0* 10.1* 10.3* 10.0* 10.3*  HCT 35.9* 32.6* 33.4* 33.6* 34.6*  MCV 101.1* 101.2* 102.5* 104.7* 105.5*  PLT 186 175 190 163 892   Basic Metabolic Panel: Recent Labs  Lab 08/11/17 0547 08/12/17 0425 08/13/17 0437 08/14/17 0434 08/15/17 0431  NA 139 139 143 141 143  K 3.4* 2.8* 3.6 4.4 4.3  CL 97* 100* 104 102 105  CO2 _1 GLUCOSE 300* 291* 268* 377* 286*  BUN 43* 38* 43* 42* 43*  CREATININE 1.00 0.85 0.89 0.88 0.84  CALCIUM 8.9 7.9* 8.6* 8.7* 8.8*  MG  --  2.0  --   --   --    GFR: Estimated Creatinine Clearance: 82.5 mL/min (by C-G formula based on SCr of 0.84 mg/dL). Liver Function Tests: Recent Labs  Lab 08/09/17 1128 08/10/17 0357 08/11/17 0547  08/12/17 0425 08/13/17 0437  AST 38 31 34 25 21  ALT _2 ALKPHOS 50 47 49 44 40  BILITOT 1.0 0.9 0.7 0.4 0.7  PROT 6.8 6.5 6.6 5.8* 6.1*  ALBUMIN 3.0* 2.8* 2.6* 2.3* 2.4*   No results for input(s): LIPASE, AMYLASE in the last 168 hours. No results for input(s): AMMONIA in the last 168 hours. Coagulation Profile: No results for input(s): INR, PROTIME in the last 168 hours. Cardiac Enzymes: No results for input(s): CKTOTAL, CKMB, CKMBINDEX, TROPONINI in the last 168 hours. BNP (last 3 results) No results for input(s): PROBNP in the last 8760 hours. HbA1C: No results for input(s): HGBA1C in the last 72 hours. CBG: Recent Labs  Lab 08/14/17 1612 08/14/17 2000 08/14/17 2301 08/15/17 0428 08/15/17 0758  GLUCAP 191* 241* 295* 260* 321*   Lipid Profile: Recent Labs    08/15/17 0847  TRIG 95   Thyroid Function Tests: No results for input(s): TSH, T4TOTAL, FREET4, T3FREE, THYROIDAB in the last 72 hours. Anemia Panel: No results for input(s): VITAMINB12, FOLATE, FERRITIN, TIBC, IRON, RETICCTPCT in the last 72 hours. Sepsis Labs: No results for input(s): PROCALCITON, LATICACIDVEN  in the last 168 hours.  Recent Results (from the past 240 hour(s))  Culture, blood (routine x 2) Call MD if unable to obtain prior to antibiotics being given     Status: None   Collection Time: 08/05/17 12:02 PM  Result Value Ref Range Status   Specimen Description LEFT ANTECUBITAL  Final   Special Requests   Final    BOTTLES DRAWN AEROBIC AND ANAEROBIC Blood Culture adequate volume   Culture   Final    NO GROWTH 5 DAYS Performed at [facility], [address] [place], [place] [address]    Report Status 08/10/2017 FINAL  Final  Respiratory Panel by PCR     Status: None   Collection Time: 08/05/17 12:37 PM  Result Value Ref Range Status   Adenovirus NOT DETECTED NOT DETECTED Final   Coronavirus 229E NOT DETECTED NOT DETECTED Final   Coronavirus HKU1 NOT DETECTED NOT DETECTED Final    Coronavirus NL63 NOT DETECTED NOT DETECTED Final   Coronavirus OC43 NOT DETECTED NOT DETECTED Final   Metapneumovirus NOT DETECTED NOT DETECTED Final   Rhinovirus / Enterovirus NOT DETECTED NOT DETECTED Final   Influenza A NOT DETECTED NOT DETECTED Final   Influenza B NOT DETECTED NOT DETECTED Final   Parainfluenza Virus 1 NOT DETECTED NOT DETECTED Final   Parainfluenza Virus 2 NOT DETECTED NOT DETECTED Final   Parainfluenza Virus 3 NOT DETECTED NOT DETECTED Final   Parainfluenza Virus 4 NOT DETECTED NOT DETECTED Final   Respiratory Syncytial Virus NOT DETECTED NOT DETECTED Final   Bordetella pertussis NOT DETECTED NOT DETECTED Final   Chlamydophila pneumoniae NOT DETECTED NOT DETECTED Final   Mycoplasma pneumoniae NOT DETECTED NOT DETECTED Final    Comment: Performed at [facility], [place] [address], [place], [place] [address]  MRSA PCR Screening     Status: None   Collection Time: 08/10/17  8:35 AM  Result Value Ref Range Status   MRSA by PCR NEGATIVE NEGATIVE Final    Comment:        The GeneXpert MRSA Assay (FDA approved for NASAL specimens only), is one component of a comprehensive MRSA colonization surveillance program. It is not intended to diagnose MRSA infection nor to guide or monitor treatment for MRSA infections. Performed at [facility], [address], [place], [place] [address]          Radiology Studies: Dg Chest [facility] 1 View  Result Date: 08/15/2017 CLINICAL DATA:  Respiratory failure, CHF, COPD EXAM: PORTABLE CHEST 1 VIEW COMPARISON:  Portable chest x-ray of August 14, 2017 FINDINGS: The lungs are mildly hypoinflated. The interstitial markings are coarse though slightly improved. There are densities at both lung bases which may reflect atelectasis. There is no pleural effusion or pneumothorax. The cardiac silhouette is enlarged. The pulmonary vascularity is mildly engorged but less prominent than on the previous study. There calcification in the wall  of the aortic arch. The endotracheal tube tip projects 4.8 cm above the carina. The esophagogastric tube tip and proximal port project below the GE junction. IMPRESSION: Bilateral hypoinflation. Slightly decreased pulmonary interstitial edema. Stable cardiomegaly with decreased pulmonary vascular congestion. Probable bibasilar subsegmental atelectasis. The support tubes are in reasonable position. Thoracic aortic atherosclerosis. Electronically Signed   By: David  Martinique M.D.   On: 08/15/2017 08:40   Dg Chest Port 1 View  Result Date: 08/14/2017 CLINICAL DATA:  Respiratory failure EXAM: PORTABLE CHEST 1 VIEW COMPARISON:  Yesterday FINDINGS: Endotracheal tube tip is between the clavicular heads and  carina. An orogastric tube and side-port reaches the stomach. Cardiopericardial enlargement and vascular pedicle widening. There is diffuse haziness of the chest and interstitial opacities. IMPRESSION: 1. Unremarkable positioning of endotracheal and orogastric tubes. 2. CHF pattern.  Pneumonia and atelectasis could be superimposed. Electronically Signed   By: Monte Fantasia M.D.   On: 08/14/2017 06:48        Scheduled Meds: . acetylcysteine  3 mL Nebulization Q4H  . amiodarone  200 mg Oral q morning - 10a  . apixaban  5 mg Oral BID  . chlorhexidine gluconate (MEDLINE KIT)  15 mL Mouth Rinse BID  . Chlorhexidine Gluconate Cloth  6 each Topical Daily  . dextromethorphan  30 mg Oral BID  . furosemide  40 mg Oral Daily  . gabapentin  400 mg Oral 2 times per day  . gabapentin  800 mg Oral QHS  . insulin aspart  0-20 Units Subcutaneous Q4H  . insulin aspart  6 Units Subcutaneous Q4H  . insulin glargine  55 Units Subcutaneous QHS  . ipratropium-albuterol  3 mL Nebulization Q4H  . ketoconazole   Topical BID  . levETIRAcetam  1,000 mg Oral BID  . levothyroxine  75 mcg Intravenous Daily  . magnesium oxide  400 mg Oral Daily  . mouth rinse  15 mL Mouth Rinse QID  . methylPREDNISolone (SOLU-MEDROL)  injection  40 mg Intravenous Q12H  . multivitamin  15 mL Oral Daily  . nystatin   Topical TID  . pantoprazole (PROTONIX) IV  40 mg Intravenous Q24H  . potassium chloride  20 mEq Oral Daily  . sodium chloride flush  10-40 mL Intracatheter Q12H   Continuous Infusions: . ceFEPime (MAXIPIME) IV Stopped (08/15/17 0901)  . feeding supplement (VITAL HIGH PROTEIN) 1,000 mL (08/14/17 1558)  . fentaNYL infusion INTRAVENOUS 375 mcg/hr (08/15/17 0949)  . midazolam (VERSED) infusion 6 mg/hr (08/15/17 1610)     LOS: 10 days    Time spent: critical care time: 62mns    JKathie Dike MD [facility] Pager [phone] If 7PM-7AM, please contact night-coverage [email or web address] Password [email or web address]/06/2018, 10:09 AM

## 2017-08-15 NOTE — Progress Notes (Signed)
Subjective: She remains intubated and on mechanical ventilation.  She had weaning attempt yesterday but became very agitated and was not able to successfully wean.  We will try again today.  No other new complaints have been noted.  But she was admitted because of community-acquired pneumonia that did not respond to outpatient treatment.  She eventually had to be intubated and placed on mechanical ventilation because of air hunger difficulty maintaining her oxygenation and near white out of her left lung.  Her chest x-ray is improving and I have personally reviewed the chest x-ray from this morning.  She is now on 40% oxygen.  Objective: Vital signs in last 24 hours: Temp:  [98.2 F (36.8 C)-98.7 F (37.1 C)] 98.2 F (36.8 C) (02/12 0801) Pulse Rate:  [43-94] 43 (02/12 0801) Resp:  [9-24] 14 (02/12 0801) BP: (103-148)/(53-103) 121/66 (02/12 0645) SpO2:  [90 %-96 %] 92 % (02/12 0801) FiO2 (%):  [40 %-45 %] 40 % (02/12 0403) Weight:  [108 kg (238 lb 1.6 oz)] 108 kg (238 lb 1.6 oz) (02/12 0500) Weight change: 1 kg (2 lb 3.3 oz) Last BM Date: 08/13/17  Intake/Output from previous day: 02/11 0701 - 02/12 0700 In: 2585.8 [I.V.:981; RV/IF:5379.4; IV Piggyback:250] Out: 1750 [Urine:1750]  PHYSICAL EXAM General appearance: Intubated sedated on mechanical ventilation Resp: rhonchi bilaterally Cardio: regular rate and rhythm, S1, S2 normal, no murmur, click, rub or gallop GI: soft, non-tender; bowel sounds normal; no masses,  no organomegaly Extremities: extremities normal, atraumatic, no cyanosis or edema Skin warm and dry  Lab Results:  Results for orders placed or performed during the hospital encounter of 08/05/17 (from the past 48 hour(s))  Glucose, capillary     Status: Abnormal   Collection Time: 08/13/17 11:48 AM  Result Value Ref Range   Glucose-Capillary 167 (H) 65 - 99 mg/dL  Glucose, capillary     Status: Abnormal   Collection Time: 08/13/17  5:13 PM  Result Value Ref Range    Glucose-Capillary 326 (H) 65 - 99 mg/dL  Glucose, capillary     Status: Abnormal   Collection Time: 08/13/17  7:58 PM  Result Value Ref Range   Glucose-Capillary 290 (H) 65 - 99 mg/dL  Glucose, capillary     Status: Abnormal   Collection Time: 08/13/17 11:23 PM  Result Value Ref Range   Glucose-Capillary 266 (H) 65 - 99 mg/dL  Glucose, capillary     Status: Abnormal   Collection Time: 08/14/17  4:30 AM  Result Value Ref Range   Glucose-Capillary 359 (H) 65 - 99 mg/dL   Comment 1 Notify RN   CBC     Status: Abnormal   Collection Time: 08/14/17  4:34 AM  Result Value Ref Range   WBC 12.3 (H) 4.0 - 10.5 K/uL   RBC 3.21 (L) 3.87 - 5.11 MIL/uL   Hemoglobin 10.0 (L) 12.0 - 15.0 g/dL   HCT 33.6 (L) 36.0 - 46.0 %   MCV 104.7 (H) 78.0 - 100.0 fL   MCH 31.2 26.0 - 34.0 pg   MCHC 29.8 (L) 30.0 - 36.0 g/dL   RDW 14.8 11.5 - 15.5 %   Platelets 163 150 - 400 K/uL    Comment: Performed at [facility], [address] [place], [place] [address]  Basic metabolic panel     Status: Abnormal   Collection Time: 08/14/17  4:34 AM  Result Value Ref Range   Sodium 141 135 - 145 mmol/L   Potassium 4.4 3.5 - 5.1 mmol/L  Comment: DELTA CHECK NOTED   Chloride 102 101 - 111 mmol/L   CO2 29 22 - 32 mmol/L   Glucose, Bld 377 (H) 65 - 99 mg/dL   BUN 42 (H) 6 - 20 mg/dL   Creatinine, Ser 0.88 0.44 - 1.00 mg/dL   Calcium 8.7 (L) 8.9 - 10.3 mg/dL   GFR calc non Af Amer >60 >60 mL/min   GFR calc Af Amer >60 >60 mL/min    Comment: (NOTE) The eGFR has been calculated using the CKD EPI equation. This calculation has not been validated in all clinical situations. eGFR's persistently <60 mL/min signify possible Chronic Kidney Disease.    Anion gap 10 5 - 15    Comment: Performed at [facility], [address], [place], [place] [address]  Blood gas, arterial     Status: Abnormal   Collection Time: 08/14/17  5:50 AM  Result Value Ref Range   FIO2 50.00    Delivery systems VENTILATOR    Mode PRESSURE  REGULATED VOLUME CONTROL    VT 520 mL   LHR 12 resp/min   Peep/cpap 5.0 cm H20   pH, Arterial 7.352 7.350 - 7.450   pCO2 arterial 53.4 (H) 32.0 - 48.0 mmHg   pO2, Arterial 82.9 (L) 83.0 - 108.0 mmHg   Bicarbonate 27.1 20.0 - 28.0 mmol/L   Acid-Base Excess 3.7 (H) 0.0 - 2.0 mmol/L   O2 Saturation 94.7 %   Collection site RADIAL    Drawn by [id number]    Sample type ARTERIAL    Allens test (pass/fail) PASS PASS   Mechanical Rate 12     Comment: Performed at [facility], [address], [place], [place] [address]  Glucose, capillary     Status: Abnormal   Collection Time: 08/14/17  7:38 AM  Result Value Ref Range   Glucose-Capillary 389 (H) 65 - 99 mg/dL  Glucose, capillary     Status: Abnormal   Collection Time: 08/14/17 11:10 AM  Result Value Ref Range   Glucose-Capillary 302 (H) 65 - 99 mg/dL  Glucose, capillary     Status: Abnormal   Collection Time: 08/14/17  4:12 PM  Result Value Ref Range   Glucose-Capillary 191 (H) 65 - 99 mg/dL  Glucose, capillary     Status: Abnormal   Collection Time: 08/14/17  8:00 PM  Result Value Ref Range   Glucose-Capillary 241 (H) 65 - 99 mg/dL   Comment 1 Notify RN   Glucose, capillary     Status: Abnormal   Collection Time: 08/14/17 11:01 PM  Result Value Ref Range   Glucose-Capillary 295 (H) 65 - 99 mg/dL   Comment 1 Notify RN   Glucose, capillary     Status: Abnormal   Collection Time: 08/15/17  4:28 AM  Result Value Ref Range   Glucose-Capillary 260 (H) 65 - 99 mg/dL   Comment 1 Notify RN    Comment 2 Document in Chart   Basic metabolic panel     Status: Abnormal   Collection Time: 08/15/17  4:31 AM  Result Value Ref Range   Sodium 143 135 - 145 mmol/L   Potassium 4.3 3.5 - 5.1 mmol/L   Chloride 105 101 - 111 mmol/L   CO2 29 22 - 32 mmol/L   Glucose, Bld 286 (H) 65 - 99 mg/dL   BUN 43 (H) 6 - 20 mg/dL   Creatinine, Ser 0.84 0.44 - 1.00 mg/dL   Calcium 8.8 (L) 8.9 - 10.3 mg/dL   GFR calc  non Af Amer >60 >60 mL/min   GFR calc Af  Amer >60 >60 mL/min    Comment: (NOTE) The eGFR has been calculated using the CKD EPI equation. This calculation has not been validated in all clinical situations. eGFR's persistently <60 mL/min signify possible Chronic Kidney Disease.    Anion gap 9 5 - 15    Comment: Performed at [facility], [address] [place], [place] [address]  CBC     Status: Abnormal   Collection Time: 08/15/17  4:31 AM  Result Value Ref Range   WBC 14.5 (H) 4.0 - 10.5 K/uL   RBC 3.28 (L) 3.87 - 5.11 MIL/uL   Hemoglobin 10.3 (L) 12.0 - 15.0 g/dL   HCT 34.6 (L) 36.0 - 46.0 %   MCV 105.5 (H) 78.0 - 100.0 fL   MCH 31.4 26.0 - 34.0 pg   MCHC 29.8 (L) 30.0 - 36.0 g/dL   RDW 15.2 11.5 - 15.5 %   Platelets 198 150 - 400 K/uL    Comment: Performed at [facility], [address], [place], [place] [address]  Blood gas, arterial     Status: Abnormal   Collection Time: 08/15/17  4:35 AM  Result Value Ref Range   FIO2 40.00    Delivery systems VENTILATOR    Mode PRESSURE REGULATED VOLUME CONTROL    VT 520 mL   LHR 12 resp/min   Peep/cpap 5.0 cm H20   pH, Arterial 7.380 7.350 - 7.450   pCO2 arterial 50.5 (H) 32.0 - 48.0 mmHg   pO2, Arterial 76.9 (L) 83.0 - 108.0 mmHg   Bicarbonate 27.8 20.0 - 28.0 mmol/L   Acid-Base Excess 4.4 (H) 0.0 - 2.0 mmol/L   O2 Saturation 94.7 %   Patient temperature 37.0    Collection site RIGHT RADIAL    Drawn by [id number]    Sample type ARTERIAL DRAW    Allens test (pass/fail) PASS PASS    Comment: Performed at [facility], [address] [place], [place] [address]  Glucose, capillary     Status: Abnormal   Collection Time: 08/15/17  7:58 AM  Result Value Ref Range   Glucose-Capillary 321 (H) 65 - 99 mg/dL    ABGS Recent Labs    08/15/17 0435  PHART 7.380  PO2ART 76.9*  HCO3 27.8   CULTURES Recent Results (from the past 240 hour(s))  Culture, blood (routine x 2) Call MD if unable to obtain prior to antibiotics being given     Status: None   Collection Time: 08/05/17  12:02 PM  Result Value Ref Range Status   Specimen Description LEFT ANTECUBITAL  Final   Special Requests   Final    BOTTLES DRAWN AEROBIC AND ANAEROBIC Blood Culture adequate volume   Culture   Final    NO GROWTH 5 DAYS Performed at [facility], [address] [place], [place] [address]    Report Status 08/10/2017 FINAL  Final  Respiratory Panel by PCR     Status: None   Collection Time: 08/05/17 12:37 PM  Result Value Ref Range Status   Adenovirus NOT DETECTED NOT DETECTED Final   Coronavirus 229E NOT DETECTED NOT DETECTED Final   Coronavirus HKU1 NOT DETECTED NOT DETECTED Final   Coronavirus NL63 NOT DETECTED NOT DETECTED Final   Coronavirus OC43 NOT DETECTED NOT DETECTED Final   Metapneumovirus NOT DETECTED NOT DETECTED Final   Rhinovirus / Enterovirus NOT DETECTED NOT DETECTED Final   Influenza A NOT DETECTED NOT DETECTED Final  Influenza B NOT DETECTED NOT DETECTED Final   Parainfluenza Virus 1 NOT DETECTED NOT DETECTED Final   Parainfluenza Virus 2 NOT DETECTED NOT DETECTED Final   Parainfluenza Virus 3 NOT DETECTED NOT DETECTED Final   Parainfluenza Virus 4 NOT DETECTED NOT DETECTED Final   Respiratory Syncytial Virus NOT DETECTED NOT DETECTED Final   Bordetella pertussis NOT DETECTED NOT DETECTED Final   Chlamydophila pneumoniae NOT DETECTED NOT DETECTED Final   Mycoplasma pneumoniae NOT DETECTED NOT DETECTED Final    Comment: Performed at [facility], [place] [address] [place], [place] [address]  MRSA PCR Screening     Status: None   Collection Time: 08/10/17  8:35 AM  Result Value Ref Range Status   MRSA by PCR NEGATIVE NEGATIVE Final    Comment:        The GeneXpert MRSA Assay (FDA approved for NASAL specimens only), is one component of a comprehensive MRSA colonization surveillance program. It is not intended to diagnose MRSA infection nor to guide or monitor treatment for MRSA infections. Performed at [facility], [address], [place], [place]  [address]    Studies/Results: Dg Chest Port 1 View  Result Date: 08/14/2017 CLINICAL DATA:  Respiratory failure EXAM: PORTABLE CHEST 1 VIEW COMPARISON:  Yesterday FINDINGS: Endotracheal tube tip is between the clavicular heads and carina. An orogastric tube and side-port reaches the stomach. Cardiopericardial enlargement and vascular pedicle widening. There is diffuse haziness of the chest and interstitial opacities. IMPRESSION: 1. Unremarkable positioning of endotracheal and orogastric tubes. 2. CHF pattern.  Pneumonia and atelectasis could be superimposed. Electronically Signed   By: Monte Fantasia M.D.   On: 08/14/2017 06:48   Dg Chest Port 1 View  Result Date: 08/13/2017 CLINICAL DATA:  Respiratory failure EXAM: PORTABLE CHEST 1 VIEW COMPARISON:  08/12/2017 and prior radiographs FINDINGS: Endotracheal tube 2.5 cm above the carina, NG tube entering the stomach with tip off the field of view again noted. Cardiomegaly and bilateral interstitial/airspace opacities, left-greater-than-right, are not significantly changed. Left lower lung consolidation/atelectasis again noted. There is no evidence of pneumothorax. IMPRESSION: Unchanged appearance of the chest with continued bilateral interstitial/airspace opacities/edema and left lower lung consolidation/atelectasis. Electronically Signed   By: Margarette Canada M.D.   On: 08/13/2017 10:00    Medications:  Prior to Admission:  Medications Prior to Admission  Medication Sig Dispense Refill Last Dose  . acetaminophen (TYLENOL) 500 MG tablet Take 500 mg by mouth every 6 (six) hours as needed for moderate pain or headache.   08/05/2017 at 0630  . albuterol (PROVENTIL HFA;VENTOLIN HFA) 108 (90 Base) MCG/ACT inhaler Inhale 2 puffs into the lungs every 4 (four) hours as needed for wheezing or shortness of breath. 1 Inhaler 0 08/04/2017 at Unknown time  . amiodarone (PACERONE) 200 MG tablet Take 1 tablet (200 mg total) by mouth every morning. 90 tablet 3 08/05/2017 at  Unknown time  . amoxicillin (AMOXIL) 500 MG capsule Take 1 capsule by mouth 3 (three) times daily.  0 08/05/2017 at 0700  . apixaban (ELIQUIS) 5 MG TABS tablet Take 1 tablet (5 mg total) by mouth 2 (two) times daily. 60 tablet 0 08/05/2017 at Unknown time  . Cyanocobalamin (B-12) 5000 MCG SUBL Take 1 tablet by mouth daily.  12 08/05/2017 at 0700  . dicyclomine (BENTYL) 10 MG capsule TAKE 1 TO 2 CAPSULES BY MOUTH BEFORE meals AS NEEDED 180 capsule 3 08/04/2017 at Unknown time  . DULoxetine (CYMBALTA) 30 MG capsule Take 30 mg by mouth  2 (two) times daily.   08/05/2017 at 0700  . famotidine (PEPCID) 20 MG tablet Take 20 mg by mouth at bedtime.    08/04/2017 at Unknown time  . furosemide (LASIX) 40 MG tablet TAKE 40 MG DAILY. 90 tablet 3 08/05/2017 at Unknown time  . gabapentin (NEURONTIN) 400 MG capsule Take 400-800 mg by mouth 3 (three) times daily. Take 1 capsule(474m) by mouth twice a day and take 2 capsules(8077m by mouth at bedtime  11 08/05/2017 at 0700  . glipiZIDE (GLUCOTROL) 10 MG tablet Take 10 mg by mouth 2 (two) times daily before a meal.    08/05/2017 at Unknown time  . Insulin Glargine (LANTUS SOLOSTAR) 100 UNIT/ML Solostar Pen Inject 20-50 Units into the skin at bedtime as needed. Patient takes 20 units every night at bedtime if over 178.  Takes 50 units if her blood sugar is above 300.   08/04/2017 at 2200  . levETIRAcetam (KEPPRA) 1000 MG tablet Take 1,000 mg by mouth 2 (two) times daily.  1 08/05/2017 at 0700  . levothyroxine (SYNTHROID, LEVOTHROID) 137 MCG tablet Take 137 mcg by mouth daily.   08/05/2017 at 0700  . loperamide (IMODIUM) 2 MG capsule TAKE ONE CAPSULE BY MOUTH TWICE DAILY AS NEEDED 60 capsule 3 08/04/2017 at Unknown time  . LORazepam (ATIVAN) 1 MG tablet Take 1 mg by mouth 3 (three) times daily as needed for anxiety or sleep. For anxiety   08/04/2017 at 2200  . magnesium oxide (MAG-OX) 400 MG tablet Take 400 mg by mouth daily.    08/05/2017 at Unknown time  . metaxalone (SKELAXIN) 800 MG tablet  Take 800 mg by mouth 3 (three) times daily.    08/05/2017 at 0700  . metFORMIN (GLUCOPHAGE) 1000 MG tablet Take 1,000 mg by mouth 2 (two) times daily with a meal.    08/05/2017 at 0700  . Multiple Vitamin (MULTIVITAMIN WITH MINERALS) TABS tablet Take 1 tablet by mouth daily.   08/05/2017 at 0700  . nitroGLYCERIN (NITROSTAT) 0.4 MG SL tablet Place 0.4 mg under the tongue every 5 (five) minutes as needed for chest pain. Reported on 07/08/2015   Taking  . ondansetron (ZOFRAN) 4 MG tablet TAKE ONE TABLET BY MOUTH FOUR TIMES DAILY FOR NAUSEA (Patient taking differently: TAKE ONE TABLET BY MOUTH FOUR TIMES DAILY FOR NAUSEA. Takes as needed) 120 tablet 3 Past Week at Unknown time  . pantoprazole (PROTONIX) 40 MG tablet TAKE ONE TABLET BY MOUTH TWICE DAILY BEFORE APPLY MEAL 180 tablet 3 08/05/2017 at 0700  . potassium chloride (K-DUR,KLOR-CON) 10 MEQ tablet TAKE TWO TABLETS BY MOUTH TWICE DAILY 120 tablet 11 08/05/2017 at 0700  . promethazine (PHENERGAN) 12.5 MG tablet Take 1 tablet (12.5 mg total) by mouth every 4 (four) hours as needed for nausea or vomiting. 42 tablet 0 Taking  . UNABLE TO FIND Take 1 tablet by mouth 2 (two) times daily. Medication Name: [employer] RX Multivitamin Diabetes Support  12 08/05/2017 at 0700   Scheduled: . acetylcysteine  3 mL Nebulization Q4H  . amiodarone  200 mg Oral q morning - 10a  . apixaban  5 mg Oral BID  . chlorhexidine gluconate (MEDLINE KIT)  15 mL Mouth Rinse BID  . Chlorhexidine Gluconate Cloth  6 each Topical Daily  . dextromethorphan  30 mg Oral BID  . furosemide  40 mg Oral Daily  . gabapentin  400 mg Oral 2 times per day  . gabapentin  800 mg Oral QHS  . insulin aspart  0-20  Units Subcutaneous Q4H  . insulin aspart  6 Units Subcutaneous Q4H  . insulin glargine  55 Units Subcutaneous QHS  . ipratropium-albuterol  3 mL Nebulization Q4H  . ketoconazole   Topical BID  . levETIRAcetam  1,000 mg Oral BID  . levothyroxine  75 mcg Intravenous Daily  . magnesium oxide  400  mg Oral Daily  . mouth rinse  15 mL Mouth Rinse QID  . methylPREDNISolone (SOLU-MEDROL) injection  40 mg Intravenous Q12H  . multivitamin  15 mL Oral Daily  . nystatin   Topical TID  . pantoprazole (PROTONIX) IV  40 mg Intravenous Q24H  . potassium chloride  20 mEq Oral Daily  . sodium chloride flush  10-40 mL Intracatheter Q12H   Continuous: . ceFEPime (MAXIPIME) IV 1 g (08/15/17 0831)  . feeding supplement (VITAL HIGH PROTEIN) 1,000 mL (08/14/17 1558)  . fentaNYL infusion INTRAVENOUS 400 mcg/hr (08/15/17 0559)  . midazolam (VERSED) infusion 6 mg/hr (08/15/17 8412)   KSK:SHNGITJLLVDIX (TYLENOL) oral liquid 160 mg/5 mL, albuterol, fentaNYL, fentaNYL (SUBLIMAZE) injection, loperamide, midazolam, nitroGLYCERIN, ondansetron (ZOFRAN) IV, sodium chloride flush  Assesment: She was admitted with community-acquired pneumonia that failed outpatient treatment.  Her pneumonia progressed which culminated in her being intubated and placed on mechanical ventilation.  She is now at a point we can try weaning but that has been limited by her agitation.  She has dementia at baseline which complicates her situation.  She has heart failure but I do not think her problem now is heart failure.  Her pneumonia is clearing by chest x-ray.  She has obstructive sleep apnea at baseline which complicates her situation as well.  She has had episodes of paroxysmal atrial fib and she is is chronically anticoagulated.  She has COPD at baseline that is having acute exacerbation which is being treated Principal Problem:   CAP Failed Outpatient Tx Active Problems:   Hypothyroidism   Hyperlipidemia   Essential hypertension   Atrial fibrillation (HCC)   Congestive heart failure (HCC)   Gastroparesis   Sleep apnea   Chronic anticoagulation   Tremor   Dementia   DM type 2 (diabetes mellitus, type 2) (HCC)   Pulmonary hypertension (HCC)   COPD exacerbation (HCC)   Acute respiratory failure with hypercapnia  ([place])    Plan: Attempt weaning again today.  She will have her Versed continued to see if we can wean her without so much agitation.    LOS: 10 days   Alondria Mousseau L 08/15/2017, 8:39 AM

## 2017-08-15 NOTE — Progress Notes (Signed)
Per Dr.Hawkins titrate down on Fentanyl gtt and leave Versed at current dose/rate for weaning trial today.

## 2017-08-15 NOTE — Progress Notes (Signed)
Nutrition Follow up  DOCUMENTATION CODES:   Obesity unspecified  INTERVENTION:   Continue Vital High Protein to goal of 55 ml/hr until pt is extubated    NUTRITION DIAGNOSIS:   Inadequate oral intake related to inability to eat as evidenced by NPO status.  GOAL:   Provide needs based on ASPEN/SCCM guidelines  MONITOR:   Vent status, Labs, I & O's, TF tolerance  ASSESSMENT:  Patient admitted on Saturday 2/2. She has pneumonia, acute respiratory failure. Chest x-ray findings today : slightly decreased pulmonary interstitial edema.  MV: 6.5 L/min Temp (24hrs), Avg:98.7 F (37.1 C), Min:98.2 F (36.8 C), Max:99.3 F (37.4 C)  Sedative : titrating fentanyl down for weaning trial  Labs and Meds reviewed. Hyperglycemia. Diuretic, PPI, mag-ox, MVI and insulin (being adjusted)  Discussed with her nurse today. No issues with tube feeding they are attempting to start weaning process. Will follow up with her tomorrow.    Recent Labs  Lab 08/12/17 0425 08/13/17 0437 08/14/17 0434 08/15/17 0431  NA 139 143 141 143  K 2.8* 3.6 4.4 4.3  CL 100* 104 102 105  CO2 26 29 29 29   BUN 38* 43* 42* 43*  CREATININE 0.85 0.89 0.88 0.84  CALCIUM 7.9* 8.6* 8.7* 8.8*  MG 2.0  --   --   --   GLUCOSE 291* 268* 377* 286*     Intake/Output Summary (Last 24 hours) at 08/15/2017 1514 Last data filed at 08/15/2017 1350 Gross per 24 hour  Intake 2374.8 ml  Output 2650 ml  Net -275.2 ml    Diet Order:  Diet NPO time specified  EDUCATION NEEDS:   No education needs have been identified at this time  Skin:  Skin Assessment: Reviewed RN Assessment  Last BM:  08/13/17  Height:   Ht Readings from Last 1 Encounters:  08/14/17 5\' 8"  (1.727 m)   Weight:   Wt Readings from Last 1 Encounters:  08/15/17 238 lb 1.6 oz (108 kg)  Wt on 2/7 229.15 lb (104.3 kg)   Ideal Body Weight:  64 kg  BMI:  Body mass index is 36.2 kg/m.  Estimated Nutritional Needs:   Kcal:  3382-5053    Protein:  120-130 gr  Fluid:  per MD goals  Royann Shivers MS,RD,CSG,LDN Office: [phone] Pager: [phone]

## 2017-08-15 NOTE — Progress Notes (Addendum)
Inpatient Diabetes Program Recommendations  AACE/ADA: New Consensus Statement on Inpatient Glycemic Control (2015)  Target Ranges:  Prepandial:   less than 140 mg/dL      Peak postprandial:   less than 180 mg/dL (1-2 hours)      Critically ill patients:  140 - 180 mg/dL   Lab Results  Component Value Date   GLUCAP 321 (H) 08/15/2017   HGBA1C 7.4 (H) 02/04/2015    Review of Glycemic Control  Results for [name], [name] (MRN [id number]) as of 08/15/2017 09:56  Ref. Range 08/14/2017 16:12 08/14/2017 20:00 08/14/2017 23:01 08/15/2017 04:28 08/15/2017 07:58  Glucose-Capillary Latest Ref Range: 65 - 99 mg/dL 248 (H) 185 (H) 909 (H) 260 (H) 321 (H)    Diabetes history:Type 2 DM  Outpatient Diabetes medications:Lantus 20 units QHS if glucose >178 mg/dl (or takes 50 units if glucose >300 mg/dl), Glipizide 10 mg BID, Metformin 1000 mg BID  Current orders for Inpatient glycemic control:Lantus55units QHS,Novolog 0-20unitsq4h, Novolog 6 units q4h; Solumedrol 40 mg Q12h  Inpatient Diabetes Program Recommendations: Blood sugars remain increased.  Please increase Novolog tube feed coverage to 8 units q 4 hours (hold if patient eats less than 50%).   Consider increasing Lantus insulin 60 units qhs.  Susette Racer, RN, BA, [place], CDE Diabetes Coordinator Inpatient Diabetes Program  [phone] (Team Pager) [phone] [facility] Office) 08/15/2017 10:00 AM

## 2017-08-16 ENCOUNTER — Inpatient Hospital Stay (HOSPITAL_COMMUNITY): Payer: Medicare Other

## 2017-08-16 DIAGNOSIS — E782 Mixed hyperlipidemia: Secondary | ICD-10-CM

## 2017-08-16 DIAGNOSIS — E1165 Type 2 diabetes mellitus with hyperglycemia: Secondary | ICD-10-CM

## 2017-08-16 DIAGNOSIS — J9602 Acute respiratory failure with hypercapnia: Secondary | ICD-10-CM

## 2017-08-16 DIAGNOSIS — J9601 Acute respiratory failure with hypoxia: Secondary | ICD-10-CM

## 2017-08-16 DIAGNOSIS — I4891 Unspecified atrial fibrillation: Secondary | ICD-10-CM

## 2017-08-16 LAB — GLUCOSE, CAPILLARY
GLUCOSE-CAPILLARY: 196 mg/dL — AB (ref 65–99)
GLUCOSE-CAPILLARY: 165 mg/dL — AB (ref 65–99)
Glucose-Capillary: 192 mg/dL — ABNORMAL HIGH (ref 65–99)
Glucose-Capillary: 242 mg/dL — ABNORMAL HIGH (ref 65–99)
Glucose-Capillary: 245 mg/dL — ABNORMAL HIGH (ref 65–99)
Glucose-Capillary: 257 mg/dL — ABNORMAL HIGH (ref 65–99)
Glucose-Capillary: 281 mg/dL — ABNORMAL HIGH (ref 65–99)
Glucose-Capillary: 83 mg/dL (ref 65–99)

## 2017-08-16 LAB — BLOOD GAS, ARTERIAL
ACID-BASE EXCESS: 5.4 mmol/L — AB (ref 0.0–2.0)
Acid-Base Excess: 4.9 mmol/L — ABNORMAL HIGH (ref 0.0–2.0)
BICARBONATE: 28.5 mmol/L — AB (ref 20.0–28.0)
Bicarbonate: 27.8 mmol/L (ref 20.0–28.0)
Drawn by: 317771
Drawn by: 23534
FIO2: 40
FIO2: 0.4
MECHVT: 520 mL
MODE: POSITIVE
O2 SAT: 95.6 %
O2 Saturation: 91.6 %
PEEP/CPAP: 5 cmH2O
PEEP: 5 cmH2O
PO2 ART: 81.9 mmHg — AB (ref 83.0–108.0)
PO2 ART: 68.5 mmHg — AB (ref 83.0–108.0)
RATE: 12 resp/min
pCO2 arterial: 48.9 mmHg — ABNORMAL HIGH (ref 32.0–48.0)
pCO2 arterial: 59 mmHg — ABNORMAL HIGH (ref 32.0–48.0)
pH, Arterial: 7.405 (ref 7.350–7.450)
pH, Arterial: 7.333 — ABNORMAL LOW (ref 7.350–7.450)

## 2017-08-16 LAB — PROCALCITONIN

## 2017-08-16 LAB — BRAIN NATRIURETIC PEPTIDE: B NATRIURETIC PEPTIDE 5: 324 pg/mL — AB (ref 0.0–100.0)

## 2017-08-16 LAB — CBC
HEMATOCRIT: 34.2 % — AB (ref 36.0–46.0)
HEMOGLOBIN: 10.3 g/dL — AB (ref 12.0–15.0)
MCH: 31.7 pg (ref 26.0–34.0)
MCHC: 30.1 g/dL (ref 30.0–36.0)
MCV: 105.2 fL — ABNORMAL HIGH (ref 78.0–100.0)
Platelets: 216 10*3/uL (ref 150–400)
RBC: 3.25 MIL/uL — AB (ref 3.87–5.11)
RDW: 15.1 % (ref 11.5–15.5)
WBC: 14.9 10*3/uL — AB (ref 4.0–10.5)

## 2017-08-16 LAB — BASIC METABOLIC PANEL
ANION GAP: 8 (ref 5–15)
BUN: 49 mg/dL — ABNORMAL HIGH (ref 6–20)
CALCIUM: 8.9 mg/dL (ref 8.9–10.3)
CHLORIDE: 106 mmol/L (ref 101–111)
CO2: 29 mmol/L (ref 22–32)
Creatinine, Ser: 0.84 mg/dL (ref 0.44–1.00)
GFR calc non Af Amer: >60 mL/min (ref >60)
Glucose, Bld: 212 mg/dL — ABNORMAL HIGH (ref 65–99)
Potassium: 4 mmol/L (ref 3.5–5.1)
Sodium: 143 mmol/L (ref 135–145)

## 2017-08-16 MED ORDER — FUROSEMIDE 10 MG/ML IJ SOLN
40.0000 mg | Freq: Once | INTRAMUSCULAR | Status: AC
  Administered 2017-08-16: 40 mg via INTRAVENOUS
  Filled 2017-08-16: qty 4

## 2017-08-16 MED ORDER — MIDAZOLAM 50MG/50ML (1MG/ML) PREMIX INFUSION
INTRAVENOUS | Status: AC
  Filled 2017-08-16: qty 50

## 2017-08-16 MED ORDER — FENTANYL CITRATE (PF) 2500 MCG/50ML IJ SOLN
INTRAMUSCULAR | Status: AC
  Filled 2017-08-16: qty 50

## 2017-08-16 NOTE — Progress Notes (Signed)
Weaning attempted pH 7.33 pCO2 59 pO2 68.5 Bicarb 27.8. Per Dr. Juanetta Gosling recommendation, increase fentanyl to keep patient comfortable. Will attempt weaning again tomorrow.

## 2017-08-16 NOTE — Progress Notes (Signed)
Per Dr. Juanetta Gosling recommendation will cut Fentanyl from 100 mcg/hr to 50 mcg/hr and will keep Versed at 6 mg/hr.

## 2017-08-16 NOTE — Progress Notes (Signed)
Per Dr. Don Perking verbal order, CVP monitoring initiated. CVP 17 currently.

## 2017-08-16 NOTE — Progress Notes (Signed)
Patient is rolling around in the bed from side to side in addition to moving hands to face. -patient was given a bolus of Fentanyl which did not help -dose of Fentanyl to be increased to 250 mcg/hr

## 2017-08-16 NOTE — Progress Notes (Signed)
Subjective: She attempted weaning yesterday and did well as far as the respiratory volumes respiratory rate and vital signs.  However her blood gas did not look as good and her pH was below 7.3.  She was placed back on ventilator support and will try weaning again today.  Objective: Vital signs in last 24 hours: Temp:  [98.2 F (36.8 C)-99.3 F (37.4 C)] 99 F (37.2 C) (02/13 0400) Pulse Rate:  [39-83] 43 (02/13 0345) Resp:  [5-24] 6 (02/13 0345) BP: (92-122)/(33-93) 111/49 (02/13 0345) SpO2:  [91 %-99 %] 98 % (02/13 0436) FiO2 (%):  [40 %] 40 % (02/13 0436) Weight change:  Last BM Date: 08/13/17  Intake/Output from previous day: 02/12 0701 - 02/13 0700 In: 1448.8 [I.V.:503.7; NG/GT:845.2; IV Piggyback:100] Out: 1775 [Urine:1500; Emesis/NG output:275]  PHYSICAL EXAM General appearance: Intubated sedated on mechanical ventilation Resp: rhonchi bilaterally Cardio: Regular with bradycardia GI: soft, non-tender; bowel sounds normal; no masses,  no organomegaly Extremities: extremities normal, atraumatic, no cyanosis or edema Skin warm and dry  Lab Results:  Results for orders placed or performed during the hospital encounter of 08/05/17 (from the past 48 hour(s))  Glucose, capillary     Status: Abnormal   Collection Time: 08/14/17  7:38 AM  Result Value Ref Range   Glucose-Capillary 389 (H) 65 - 99 mg/dL  Glucose, capillary     Status: Abnormal   Collection Time: 08/14/17 11:10 AM  Result Value Ref Range   Glucose-Capillary 302 (H) 65 - 99 mg/dL  Glucose, capillary     Status: Abnormal   Collection Time: 08/14/17  4:12 PM  Result Value Ref Range   Glucose-Capillary 191 (H) 65 - 99 mg/dL  Glucose, capillary     Status: Abnormal   Collection Time: 08/14/17  8:00 PM  Result Value Ref Range   Glucose-Capillary 241 (H) 65 - 99 mg/dL   Comment 1 Notify RN   Glucose, capillary     Status: Abnormal   Collection Time: 08/14/17 11:01 PM  Result Value Ref Range    Glucose-Capillary 295 (H) 65 - 99 mg/dL   Comment 1 Notify RN   Glucose, capillary     Status: Abnormal   Collection Time: 08/15/17  4:28 AM  Result Value Ref Range   Glucose-Capillary 260 (H) 65 - 99 mg/dL   Comment 1 Notify RN    Comment 2 Document in Chart   Basic metabolic panel     Status: Abnormal   Collection Time: 08/15/17  4:31 AM  Result Value Ref Range   Sodium 143 135 - 145 mmol/L   Potassium 4.3 3.5 - 5.1 mmol/L   Chloride 105 101 - 111 mmol/L   CO2 29 22 - 32 mmol/L   Glucose, Bld 286 (H) 65 - 99 mg/dL   BUN 43 (H) 6 - 20 mg/dL   Creatinine, Ser 0.84 0.44 - 1.00 mg/dL   Calcium 8.8 (L) 8.9 - 10.3 mg/dL   GFR calc non Af Amer >60 >60 mL/min   GFR calc Af Amer >60 >60 mL/min    Comment: (NOTE) The eGFR has been calculated using the CKD EPI equation. This calculation has not been validated in all clinical situations. eGFR's persistently <60 mL/min signify possible Chronic Kidney Disease.    Anion gap 9 5 - 15    Comment: Performed at [facility], [address], [place], [place] [address]  CBC     Status: Abnormal   Collection Time: 08/15/17  4:31 AM  Result Value Ref  Range   WBC 14.5 (H) 4.0 - 10.5 K/uL   RBC 3.28 (L) 3.87 - 5.11 MIL/uL   Hemoglobin 10.3 (L) 12.0 - 15.0 g/dL   HCT 34.6 (L) 36.0 - 46.0 %   MCV 105.5 (H) 78.0 - 100.0 fL   MCH 31.4 26.0 - 34.0 pg   MCHC 29.8 (L) 30.0 - 36.0 g/dL   RDW 15.2 11.5 - 15.5 %   Platelets 198 150 - 400 K/uL    Comment: Performed at [facility], [address], [place], [place] [address]  Blood gas, arterial     Status: Abnormal   Collection Time: 08/15/17  4:35 AM  Result Value Ref Range   FIO2 40.00    Delivery systems VENTILATOR    Mode PRESSURE REGULATED VOLUME CONTROL    VT 520 mL   LHR 12 resp/min   Peep/cpap 5.0 cm H20   pH, Arterial 7.380 7.350 - 7.450   pCO2 arterial 50.5 (H) 32.0 - 48.0 mmHg   pO2, Arterial 76.9 (L) 83.0 - 108.0 mmHg   Bicarbonate 27.8 20.0 - 28.0 mmol/L   Acid-Base Excess 4.4 (H) 0.0  - 2.0 mmol/L   O2 Saturation 94.7 %   Patient temperature 37.0    Collection site RIGHT RADIAL    Drawn by [id number]    Sample type ARTERIAL DRAW    Allens test (pass/fail) PASS PASS    Comment: Performed at [facility], [address], [place], [place] [address]  Glucose, capillary     Status: Abnormal   Collection Time: 08/15/17  7:58 AM  Result Value Ref Range   Glucose-Capillary 321 (H) 65 - 99 mg/dL  Triglycerides     Status: None   Collection Time: 08/15/17  8:47 AM  Result Value Ref Range   Triglycerides 95 <150 mg/dL    Comment: Performed at [facility], [address] [place], [place] [address]  Glucose, capillary     Status: Abnormal   Collection Time: 08/15/17 11:45 AM  Result Value Ref Range   Glucose-Capillary 298 (H) 65 - 99 mg/dL  Glucose, capillary     Status: Abnormal   Collection Time: 08/15/17  4:34 PM  Result Value Ref Range   Glucose-Capillary 285 (H) 65 - 99 mg/dL  Blood gas, arterial     Status: Abnormal   Collection Time: 08/15/17  6:00 PM  Result Value Ref Range   FIO2 40.00    Delivery systems VENTILATOR    Mode CONTINUOUS POSITIVE AIRWAY PRESSURE    Peep/cpap 5.0 cm H20   Pressure support 10 cm H20   pH, Arterial 7.29 (L) 7.350 - 7.450   pCO2 arterial 63.8 (H) 32.0 - 48.0 mmHg   pO2, Arterial 85.2 83.0 - 108.0 mmHg   Bicarbonate 26.4 20.0 - 28.0 mmol/L   Acid-Base Excess 3.6 (H) 0.0 - 2.0 mmol/L   O2 Saturation 94.8 %   Patient temperature 37.0    Collection site RIGHT RADIAL    Drawn by [id number]    Sample type ARTERIAL DRAW    Allens test (pass/fail) PASS PASS    Comment: Performed at [facility], [address], [place], [place] [address]  Glucose, capillary     Status: Abnormal   Collection Time: 08/15/17  7:44 PM  Result Value Ref Range   Glucose-Capillary 333 (H) 65 - 99 mg/dL   Comment 1 Notify RN   Glucose, capillary     Status: Abnormal   Collection Time: 08/16/17 12:11 AM  Result Value Ref  Range   Glucose-Capillary 257 (H) 65 - 99  mg/dL   Comment 1 Notify RN   Glucose, capillary     Status: Abnormal   Collection Time: 08/16/17  3:56 AM  Result Value Ref Range   Glucose-Capillary 196 (H) 65 - 99 mg/dL   Comment 1 Notify RN   Blood gas, arterial     Status: Abnormal   Collection Time: 08/16/17  5:00 AM  Result Value Ref Range   FIO2 40.00    Delivery systems VENTILATOR    Mode PRESSURE REGULATED VOLUME CONTROL    VT 520 mL   LHR 12.0 resp/min   Peep/cpap 5.0 cm H20   pH, Arterial 7.405 7.350 - 7.450   pCO2 arterial 48.9 (H) 32.0 - 48.0 mmHg   pO2, Arterial 81.9 (L) 83.0 - 108.0 mmHg   Bicarbonate 28.5 (H) 20.0 - 28.0 mmol/L   Acid-Base Excess 5.4 (H) 0.0 - 2.0 mmol/L   O2 Saturation 95.6 %   Collection site RIGHT RADIAL    Drawn by [id number]    Sample type ARTERIAL DRAW    Allens test (pass/fail) PASS PASS    Comment: Performed at [facility], [address], [place], [place] [address]  CBC     Status: Abnormal   Collection Time: 08/16/17  5:29 AM  Result Value Ref Range   WBC 14.9 (H) 4.0 - 10.5 K/uL   RBC 3.25 (L) 3.87 - 5.11 MIL/uL   Hemoglobin 10.3 (L) 12.0 - 15.0 g/dL   HCT 34.2 (L) 36.0 - 46.0 %   MCV 105.2 (H) 78.0 - 100.0 fL   MCH 31.7 26.0 - 34.0 pg   MCHC 30.1 30.0 - 36.0 g/dL   RDW 15.1 11.5 - 15.5 %   Platelets 216 150 - 400 K/uL    Comment: Performed at [facility], [address] [place], [place] [address]  Basic metabolic panel     Status: Abnormal   Collection Time: 08/16/17  5:29 AM  Result Value Ref Range   Sodium 143 135 - 145 mmol/L   Potassium 4.0 3.5 - 5.1 mmol/L   Chloride 106 101 - 111 mmol/L   CO2 29 22 - 32 mmol/L   Glucose, Bld 212 (H) 65 - 99 mg/dL   BUN 49 (H) 6 - 20 mg/dL   Creatinine, Ser 0.84 0.44 - 1.00 mg/dL   Calcium 8.9 8.9 - 10.3 mg/dL   GFR calc non Af Amer >60 >60 mL/min   GFR calc Af Amer >60 >60 mL/min    Comment: (NOTE) The eGFR has been calculated using the CKD EPI equation. This calculation has not been validated in all clinical situations. eGFR's  persistently <60 mL/min signify possible Chronic Kidney Disease.    Anion gap 8 5 - 15    Comment: Performed at [facility], [address] [place], [place] [address]    ABGS Recent Labs    08/16/17 0500  PHART 7.405  PO2ART 81.9*  HCO3 28.5*   CULTURES Recent Results (from the past 240 hour(s))  MRSA PCR Screening     Status: None   Collection Time: 08/10/17  8:35 AM  Result Value Ref Range Status   MRSA by PCR NEGATIVE NEGATIVE Final    Comment:        The GeneXpert MRSA Assay (FDA approved for NASAL specimens only), is one component of a comprehensive MRSA colonization surveillance program. It is not intended to diagnose MRSA infection nor to guide or monitor treatment for MRSA  infections. Performed at [facility], [address], [place],  [address]    Studies/Results: Dg Chest Port 1 View  Result Date: 08/15/2017 CLINICAL DATA:  Respiratory failure, CHF, COPD EXAM: PORTABLE CHEST 1 VIEW COMPARISON:  Portable chest x-ray of August 14, 2017 FINDINGS: The lungs are mildly hypoinflated. The interstitial markings are coarse though slightly improved. There are densities at both lung bases which may reflect atelectasis. There is no pleural effusion or pneumothorax. The cardiac silhouette is enlarged. The pulmonary vascularity is mildly engorged but less prominent than on the previous study. There calcification in the wall of the aortic arch. The endotracheal tube tip projects 4.8 cm above the carina. The esophagogastric tube tip and proximal port project below the GE junction. IMPRESSION: Bilateral hypoinflation. Slightly decreased pulmonary interstitial edema. Stable cardiomegaly with decreased pulmonary vascular congestion. Probable bibasilar subsegmental atelectasis. The support tubes are in reasonable position. Thoracic aortic atherosclerosis. Electronically Signed   By: David  Martinique M.D.   On: 08/15/2017 08:40    Medications:  Prior to Admission:  Medications  Prior to Admission  Medication Sig Dispense Refill Last Dose  . acetaminophen (TYLENOL) 500 MG tablet Take 500 mg by mouth every 6 (six) hours as needed for moderate pain or headache.   08/05/2017 at 0630  . albuterol (PROVENTIL HFA;VENTOLIN HFA) 108 (90 Base) MCG/ACT inhaler Inhale 2 puffs into the lungs every 4 (four) hours as needed for wheezing or shortness of breath. 1 Inhaler 0 08/04/2017 at Unknown time  . amiodarone (PACERONE) 200 MG tablet Take 1 tablet (200 mg total) by mouth every morning. 90 tablet 3 08/05/2017 at Unknown time  . amoxicillin (AMOXIL) 500 MG capsule Take 1 capsule by mouth 3 (three) times daily.  0 08/05/2017 at 0700  . apixaban (ELIQUIS) 5 MG TABS tablet Take 1 tablet (5 mg total) by mouth 2 (two) times daily. 60 tablet 0 08/05/2017 at Unknown time  . Cyanocobalamin (B-12) 5000 MCG SUBL Take 1 tablet by mouth daily.  12 08/05/2017 at 0700  . dicyclomine (BENTYL) 10 MG capsule TAKE 1 TO 2 CAPSULES BY MOUTH BEFORE meals AS NEEDED 180 capsule 3 08/04/2017 at Unknown time  . DULoxetine (CYMBALTA) 30 MG capsule Take 30 mg by mouth 2 (two) times daily.   08/05/2017 at 0700  . famotidine (PEPCID) 20 MG tablet Take 20 mg by mouth at bedtime.    08/04/2017 at Unknown time  . furosemide (LASIX) 40 MG tablet TAKE 40 MG DAILY. 90 tablet 3 08/05/2017 at Unknown time  . gabapentin (NEURONTIN) 400 MG capsule Take 400-800 mg by mouth 3 (three) times daily. Take 1 capsule(466m) by mouth twice a day and take 2 capsules(8064m by mouth at bedtime  11 08/05/2017 at 0700  . glipiZIDE (GLUCOTROL) 10 MG tablet Take 10 mg by mouth 2 (two) times daily before a meal.    08/05/2017 at Unknown time  . Insulin Glargine (LANTUS SOLOSTAR) 100 UNIT/ML Solostar Pen Inject 20-50 Units into the skin at bedtime as needed. Patient takes 20 units every night at bedtime if over 178.  Takes 50 units if her blood sugar is above 300.   08/04/2017 at 2200  . levETIRAcetam (KEPPRA) 1000 MG tablet Take 1,000 mg by mouth 2 (two) times daily.   1 08/05/2017 at 0700  . levothyroxine (SYNTHROID, LEVOTHROID) 137 MCG tablet Take 137 mcg by mouth daily.   08/05/2017 at 0700  . loperamide (IMODIUM) 2 MG capsule TAKE ONE CAPSULE BY MOUTH TWICE DAILY AS NEEDED 60 capsule  3 08/04/2017 at Unknown time  . LORazepam (ATIVAN) 1 MG tablet Take 1 mg by mouth 3 (three) times daily as needed for anxiety or sleep. For anxiety   08/04/2017 at 2200  . magnesium oxide (MAG-OX) 400 MG tablet Take 400 mg by mouth daily.    08/05/2017 at Unknown time  . metaxalone (SKELAXIN) 800 MG tablet Take 800 mg by mouth 3 (three) times daily.    08/05/2017 at 0700  . metFORMIN (GLUCOPHAGE) 1000 MG tablet Take 1,000 mg by mouth 2 (two) times daily with a meal.    08/05/2017 at 0700  . Multiple Vitamin (MULTIVITAMIN WITH MINERALS) TABS tablet Take 1 tablet by mouth daily.   08/05/2017 at 0700  . nitroGLYCERIN (NITROSTAT) 0.4 MG SL tablet Place 0.4 mg under the tongue every 5 (five) minutes as needed for chest pain. Reported on 07/08/2015   Taking  . ondansetron (ZOFRAN) 4 MG tablet TAKE ONE TABLET BY MOUTH FOUR TIMES DAILY FOR NAUSEA (Patient taking differently: TAKE ONE TABLET BY MOUTH FOUR TIMES DAILY FOR NAUSEA. Takes as needed) 120 tablet 3 Past Week at Unknown time  . pantoprazole (PROTONIX) 40 MG tablet TAKE ONE TABLET BY MOUTH TWICE DAILY BEFORE APPLY MEAL 180 tablet 3 08/05/2017 at 0700  . potassium chloride (K-DUR,KLOR-CON) 10 MEQ tablet TAKE TWO TABLETS BY MOUTH TWICE DAILY 120 tablet 11 08/05/2017 at 0700  . promethazine (PHENERGAN) 12.5 MG tablet Take 1 tablet (12.5 mg total) by mouth every 4 (four) hours as needed for nausea or vomiting. 42 tablet 0 Taking  . UNABLE TO FIND Take 1 tablet by mouth 2 (two) times daily. Medication Name: [employer] RX Multivitamin Diabetes Support  12 08/05/2017 at 0700   Scheduled: . acetylcysteine  3 mL Nebulization Q4H  . amiodarone  200 mg Oral q morning - 10a  . apixaban  5 mg Oral BID  . chlorhexidine gluconate (MEDLINE KIT)  15 mL Mouth Rinse BID   . Chlorhexidine Gluconate Cloth  6 each Topical Daily  . dextromethorphan  30 mg Oral BID  . furosemide  40 mg Oral Daily  . gabapentin  400 mg Oral 2 times per day  . gabapentin  800 mg Oral QHS  . insulin aspart  0-20 Units Subcutaneous Q4H  . insulin aspart  8 Units Subcutaneous Q4H  . insulin glargine  55 Units Subcutaneous QHS  . ipratropium-albuterol  3 mL Nebulization Q4H  . ketoconazole   Topical BID  . levETIRAcetam  1,000 mg Oral BID  . levothyroxine  75 mcg Intravenous Daily  . magnesium oxide  400 mg Oral Daily  . mouth rinse  15 mL Mouth Rinse QID  . methylPREDNISolone (SOLU-MEDROL) injection  40 mg Intravenous Q12H  . multivitamin  15 mL Oral Daily  . nystatin   Topical TID  . pantoprazole (PROTONIX) IV  40 mg Intravenous Q24H  . potassium chloride  20 mEq Oral Daily  . sodium chloride flush  10-40 mL Intracatheter Q12H   Continuous: . ceFEPime (MAXIPIME) IV Stopped (08/16/17 0233)  . feeding supplement (VITAL HIGH PROTEIN) 1,000 mL (08/15/17 1052)  . fentaNYL infusion INTRAVENOUS 100 mcg/hr (08/16/17 0553)  . midazolam (VERSED) infusion 6 mg/hr (08/16/17 0552)   MLY:YTKPTWSFKCLEX (TYLENOL) oral liquid 160 mg/5 mL, albuterol, fentaNYL, fentaNYL (SUBLIMAZE) injection, loperamide, midazolam, nitroGLYCERIN, ondansetron (ZOFRAN) IV, sodium chloride flush  Assesment: She was admitted with community-acquired pneumonia that had failed outpatient treatment.  She had acute hypoxic and hypercapnic respiratory failure.  She was treated with BiPAP which  she did not tolerate well and then was found to have near "white out" of her left lung as well as respiratory distress and difficulty maintaining her oxygen saturations.  She was intubated a week ago.  She initially required high FiO2 but that has been weaned down to 40% now.    Chest x-ray shows improvement in her pneumonia.    She has sleep apnea which can complicate her situation.    She has chronic diastolic heart failure  but she is only +600 cc since admission and this does not appear to be an active issue right now.  She has chronic atrial fib but is in sinus bradycardia now.  She is chronically anticoagulated because of the chronic atrial fib  At baseline she has dementia and she is frequently very anxious Principal Problem:   CAP Failed Outpatient Tx Active Problems:   Hypothyroidism   Hyperlipidemia   Essential hypertension   Atrial fibrillation (HCC)   Congestive heart failure (HCC)   Gastroparesis   Sleep apnea   Chronic anticoagulation   Tremor   Dementia   DM type 2 (diabetes mellitus, type 2) (HCC)   Pulmonary hypertension (HCC)   COPD exacerbation (HCC)   Acute respiratory failure with hypercapnia ([place])    Plan: Continue treatments.  Try weaning again today.  I am encouraged that she did well with her volumes and vitals yesterday without developing severe agitation    LOS: 11 days   [name] L 08/16/2017, 7:12 AM

## 2017-08-16 NOTE — Progress Notes (Addendum)
PROGRESS NOTE  [name] DOB: [birth year] DOA: 08/05/2017 PCP: Lemmie Evens, MD  Brief History:   67 y.o.femalewith multiple medical comorbidities detailed below presented to the emergency department with persistent cough and shortness of breath for the past 2 weeks that failed outpatient management with 2 courses of oral amoxicillin. Patient reports that she is on her second round of amoxicillin and continues to feel short of breath. She has nonproductive cough. She has some wheezing. She has no chest pain. The patient has no swelling in the extremities. She has a congestive heart failure that is chronic and she is taking her diuretics regularly. She denies palpitations. She presented to the ED and noted to be tachypneic. She was afebrile. A chest x-ray was obtained that did show a large left retro-cardiac opacity that was suspicious for pneumonia. EKG was unremarkable except for notation of prolonged PR interval. Her troponin was 0.07. She had a mildly elevated BNP at 189. The patient's blood sugar was elevated at 236. She was given treatment in the emergency department but remained tachypneic.   She was admitted to the hospital and initially required BiPAP therapy, but then decompensated and required intubation.  She has been slow to progress.  Weaning trials have been complicated by development of agitation.  Pulmonology following.    Assessment/Plan: Acute respiratory failure with hypoxia and hypercapnia -Multifactorial with the main driving factor related to pneumonia -The patient also has COPD and diastolic CHF with likely undiagnosed OSA/OHS -Appreciate pulmonary follow-up -Continue weaning from mechanical ventilation -Continue Solu-Medrol  Lobar Pneumonia -check PCT -d/c abx after today's doses--would complete 12 days  Chronic diastolic CHF -asked RN to set up CVP -maintain neg fluid balance -give additional dose lasix today -check  BNP -12/26/2015 Echo--normal EF, grade 1 DD, mild MR, PAS P 44 -2/13--Personally reviewed chest x-ray--increased interstitial markings -Daily weights -repeat Echo  Paroxysmal atrial fibrillation -Continue amiodarone -Presently in sinus rhythm rhythm -Continue apixaban  Essential hypertension -Stable presently  COPD -No wheezing presently  Diabetes mellitus type 2 -Check hemoglobin A1c -Continue Lantus 55 units daily -Continue NovoLog sliding scale -Continue NovoLog 8 units q 4hr  Hypothyroidism -Continue Synthroid   Seizure disorder -Continue Keppra       Disposition Plan:   Remain in ICU Family Communication:   Daughter updateed at bedside--Total time spent 35 minutes.  Greater than 50% spent face to face counseling and coordinating care.   Consultants:  pulmonary  Code Status:  FULL   DVT Prophylaxis:  apixaban   Procedures: As Listed in Progress Note Above  Antibiotics: Cefepime 2/5>>> vanc 2/5>>>28 The patient is critically ill with multiple organ systems failure and requires high complexity decision making for assessment and support, frequent evaluation and titration of therapies, application of advanced monitoring technologies and extensive interpretation of multiple databases.  Critical care time - 35 mins.     Subjective: The patient is sedated on mechanical ventilation.  There is no vomiting or respiratory distress.  She has had difficulty weaning becoming agitated during weans from mechanical ventilation.  She is having loose stools with a Flexi-Seal.  Objective: Vitals:   08/16/17 0838 08/16/17 0851 08/16/17 0854 08/16/17 1148  BP:      Pulse:   81   Resp:   (!) 27   Temp:  98.1 F (36.7 C)  98.1 F (36.7 C)  TempSrc:  Axillary  Oral  SpO2: 93%  93%   Weight:  Height:        Intake/Output Summary (Last 24 hours) at 08/16/2017 1214 Last data filed at 08/16/2017 1100 Gross per 24 hour  Intake 1448.83 ml  Output 1801 ml  Net  -352.17 ml   Weight change:  Exam:   General:  Pt is alert, follows commands appropriately, not in acute distress  HEENT: No icterus, No thrush, No neck mass, [place]/AT  Cardiovascular: RRR, S1/S2, no rubs, no gallops  Respiratory: Bibasilar crackles.  No wheezing.  Good air movement.  Abdomen: Soft/+BS, non tender, non distended, no guarding  Extremities: trace LE edema, No lymphangitis, No petechiae, No rashes, no synovitis   Data Reviewed: I have personally reviewed following labs and imaging studies Basic Metabolic Panel: Recent Labs  Lab 08/12/17 0425 08/13/17 0437 08/14/17 0434 08/15/17 0431 08/16/17 0529  NA 139 143 141 143 143  K 2.8* 3.6 4.4 4.3 4.0  CL 100* 104 102 105 106  CO2 _0 GLUCOSE 291* 268* 377* 286* 212*  BUN 38* 43* 42* 43* 49*  CREATININE 0.85 0.89 0.88 0.84 0.84  CALCIUM 7.9* 8.6* 8.7* 8.8* 8.9  MG 2.0  --   --   --   --    Liver Function Tests: Recent Labs  Lab 08/10/17 0357 08/11/17 0547 08/12/17 0425 08/13/17 0437  AST 31 34 25 21  ALT _1 ALKPHOS 47 49 44 40  BILITOT 0.9 0.7 0.4 0.7  PROT 6.5 6.6 5.8* 6.1*  ALBUMIN 2.8* 2.6* 2.3* 2.4*   No results for input(s): LIPASE, AMYLASE in the last 168 hours. No results for input(s): AMMONIA in the last 168 hours. Coagulation Profile: No results for input(s): INR, PROTIME in the last 168 hours. CBC: Recent Labs  Lab 08/12/17 0425 08/13/17 0437 08/14/17 0434 08/15/17 0431 08/16/17 0529  WBC 8.1 10.2 12.3* 14.5* 14.9*  HGB 10.1* 10.3* 10.0* 10.3* 10.3*  HCT 32.6* 33.4* 33.6* 34.6* 34.2*  MCV 101.2* 102.5* 104.7* 105.5* 105.2*  PLT 175 190 163 198 216   Cardiac Enzymes: No results for input(s): CKTOTAL, CKMB, CKMBINDEX, TROPONINI in the last 168 hours. BNP: Invalid input(s): POCBNP CBG: Recent Labs  Lab 08/15/17 1944 08/16/17 0011 08/16/17 0356 08/16/17 0755 08/16/17 1115  GLUCAP 333* 257* 196* 192* 83   HbA1C: No results for input(s): HGBA1C in the  last 72 hours. Urine analysis:    Component Value Date/Time   COLORURINE YELLOW 02/05/2015 1700   APPEARANCEUR CLEAR 02/05/2015 1700   LABSPEC 1.015 02/05/2015 1700   PHURINE 6.0 02/05/2015 1700   GLUCOSEU NEGATIVE 02/05/2015 1700   HGBUR TRACE (A) 02/05/2015 1700   BILIRUBINUR NEGATIVE 02/05/2015 1700   KETONESUR TRACE (A) 02/05/2015 1700   PROTEINUR NEGATIVE 02/05/2015 1700   UROBILINOGEN 0.2 02/05/2015 1700   NITRITE NEGATIVE 02/05/2015 1700   LEUKOCYTESUR SMALL (A) 02/05/2015 1700   Sepsis Labs: _2 (procalcitonin:4,lacticidven:4) ) Recent Results (from the past 240 hour(s))  MRSA PCR Screening     Status: None   Collection Time: 08/10/17  8:35 AM  Result Value Ref Range Status   MRSA by PCR NEGATIVE NEGATIVE Final    Comment:        The GeneXpert MRSA Assay (FDA approved for NASAL specimens only), is one component of a comprehensive MRSA colonization surveillance program. It is not intended to diagnose MRSA infection nor to guide or monitor treatment for MRSA infections. Performed at [facility], [address] [place],  [address]      Scheduled  Meds: . amiodarone  200 mg Oral q morning - 10a  . apixaban  5 mg Oral BID  . chlorhexidine gluconate (MEDLINE KIT)  15 mL Mouth Rinse BID  . Chlorhexidine Gluconate Cloth  6 each Topical Daily  . dextromethorphan  30 mg Oral BID  . furosemide  40 mg Oral Daily  . gabapentin  400 mg Oral 2 times per day  . gabapentin  800 mg Oral QHS  . insulin aspart  0-20 Units Subcutaneous Q4H  . insulin aspart  8 Units Subcutaneous Q4H  . insulin glargine  55 Units Subcutaneous QHS  . ipratropium-albuterol  3 mL Nebulization Q4H  . ketoconazole   Topical BID  . levETIRAcetam  1,000 mg Oral BID  . levothyroxine  75 mcg Intravenous Daily  . magnesium oxide  400 mg Oral Daily  . mouth rinse  15 mL Mouth Rinse QID  . methylPREDNISolone (SOLU-MEDROL) injection  40 mg Intravenous Q12H  . multivitamin  15 mL Oral Daily   . nystatin   Topical TID  . pantoprazole (PROTONIX) IV  40 mg Intravenous Q24H  . potassium chloride  20 mEq Oral Daily  . sodium chloride flush  10-40 mL Intracatheter Q12H   Continuous Infusions: . ceFEPime (MAXIPIME) IV Stopped (08/16/17 0911)  . feeding supplement (VITAL HIGH PROTEIN) 1,000 mL (08/15/17 1052)  . fentaNYL infusion INTRAVENOUS 125 mcg/hr (08/16/17 1050)  . midazolam (VERSED) infusion 6 mg/hr (08/16/17 0552)    Procedures/Studies: Dg Chest 2 View  Result Date: 08/05/2017 CLINICAL DATA:  Shortness of breath.  Cough. EXAM: CHEST  2 VIEW COMPARISON:  September 30, 2016 FINDINGS: No pneumothorax. Stable cardiomegaly. The hila and mediastinum are unchanged. There appears to be increased opacity in the left retrocardiac region which could represent pneumonia given history. Recommend follow-up to resolution. IMPRESSION: Left retrocardiac opacity may represent pneumonia given history. Recommend follow-up to resolution. Electronically Signed   By: Dorise Bullion III M.D   On: 08/05/2017 09:43   Dg Chest Port 1 View  Result Date: 08/16/2017 CLINICAL DATA:  Respiratory failure. EXAM: PORTABLE CHEST 1 VIEW COMPARISON:  08/15/2017 FINDINGS: The patient is rotated to the right. Endotracheal tube terminates 4.2 cm above the carina. Enteric tube courses into the upper abdomen with tip not imaged. The cardiac silhouette remains enlarged. Lung volumes remain low with grossly similar appearance of left greater than right basilar opacities suggesting atelectasis. Pulmonary vascular congestion and bilateral interstitial densities have slightly increased. No large pleural effusion or pneumothorax is identified. IMPRESSION: 1. Slightly increased pulmonary edema. 2. Bibasilar atelectasis. Electronically Signed   By: Logan Bores M.D.   On: 08/16/2017 07:48   Dg Chest Port 1 View  Result Date: 08/15/2017 CLINICAL DATA:  Respiratory failure, CHF, COPD EXAM: PORTABLE CHEST 1 VIEW COMPARISON:  Portable  chest x-ray of August 14, 2017 FINDINGS: The lungs are mildly hypoinflated. The interstitial markings are coarse though slightly improved. There are densities at both lung bases which may reflect atelectasis. There is no pleural effusion or pneumothorax. The cardiac silhouette is enlarged. The pulmonary vascularity is mildly engorged but less prominent than on the previous study. There calcification in the wall of the aortic arch. The endotracheal tube tip projects 4.8 cm above the carina. The esophagogastric tube tip and proximal port project below the GE junction. IMPRESSION: Bilateral hypoinflation. Slightly decreased pulmonary interstitial edema. Stable cardiomegaly with decreased pulmonary vascular congestion. Probable bibasilar subsegmental atelectasis. The support tubes are in reasonable position. Thoracic aortic atherosclerosis. Electronically Signed  By: Shaney Deckman  [place] M.D.   On: 08/15/2017 08:40   Dg Chest Port 1 View  Result Date: 08/14/2017 CLINICAL DATA:  Respiratory failure EXAM: PORTABLE CHEST 1 VIEW COMPARISON:  Yesterday FINDINGS: Endotracheal tube tip is between the clavicular heads and carina. An orogastric tube and side-port reaches the stomach. Cardiopericardial enlargement and vascular pedicle widening. There is diffuse haziness of the chest and interstitial opacities. IMPRESSION: 1. Unremarkable positioning of endotracheal and orogastric tubes. 2. CHF pattern.  Pneumonia and atelectasis could be superimposed. Electronically Signed   By: Monte Fantasia M.D.   On: 08/14/2017 06:48   Dg Chest Port 1 View  Result Date: 08/13/2017 CLINICAL DATA:  Respiratory failure EXAM: PORTABLE CHEST 1 VIEW COMPARISON:  08/12/2017 and prior radiographs FINDINGS: Endotracheal tube 2.5 cm above the carina, NG tube entering the stomach with tip off the field of view again noted. Cardiomegaly and bilateral interstitial/airspace opacities, left-greater-than-right, are not significantly changed. Left lower  lung consolidation/atelectasis again noted. There is no evidence of pneumothorax. IMPRESSION: Unchanged appearance of the chest with continued bilateral interstitial/airspace opacities/edema and left lower lung consolidation/atelectasis. Electronically Signed   By: Margarette Canada M.D.   On: 08/13/2017 10:00   Dg Chest Port 1 View  Result Date: 08/12/2017 CLINICAL DATA:  Respiratory failure with shortness of breath EXAM: PORTABLE CHEST 1 VIEW COMPARISON:  Yesterday FINDINGS: Endotracheal tube tip between the clavicular heads and carina. Orogastric tube with tip at the GE junction. Cardiopericardial enlargement. There is diffuse hazy opacity of the chest with probable pleural fluid greater on the left. No visible pneumothorax. Artifact from EKG leads. These results will be called to the ordering clinician or representative by the Radiologist Assistant, and communication documented in the PACS or zVision Dashboard. IMPRESSION: 1. Orogastric tube tip at the GE junction. Consider ~5 cm advancement into the stomach. 2. CHF pattern similar to yesterday. Electronically Signed   By: Monte Fantasia M.D.   On: 08/12/2017 07:52   Dg Chest Port 1 View  Result Date: 08/11/2017 CLINICAL DATA:  Respiratory failure EXAM: PORTABLE CHEST 1 VIEW COMPARISON:  08/10/2017 FINDINGS: Cardiac shadow remains enlarged. Aortic calcifications are again seen. Endotracheal tube and nasogastric catheter are noted in satisfactory position. Diffuse airspace opacities are again identified bilaterally with some improved aeration in the left upper lobe. No bony abnormality is seen. IMPRESSION: Slight improved aeration particularly on the left. Electronically Signed   By: Inez Catalina M.D.   On: 08/11/2017 07:54   Portable Chest Xray  Result Date: 08/10/2017 CLINICAL DATA:  Endotracheal tube EXAM: PORTABLE CHEST 1 VIEW COMPARISON:  08/09/2017 FINDINGS: Endotracheal tube 3 cm above the carina.  NG tube in the stomach Diffuse bilateral airspace  disease asymmetric on the left shows mild interval improvement. No significant effusion. IMPRESSION: Endotracheal tube 3 cm above the carina Severe diffuse bilateral airspace disease with mild interval improvement from yesterday. Electronically Signed   By: Franchot Gallo M.D.   On: 08/10/2017 06:51   Portable Chest X-ray  Result Date: 08/09/2017 CLINICAL DATA:  Endotracheal tube placement. EXAM: PORTABLE CHEST 1 VIEW COMPARISON:  Radiograph of August 08, 2017. FINDINGS: Mild cardiomegaly is noted with central pulmonary vascular congestion. Endotracheal tube is seen projected over tracheal air shadow with distal tip 2 cm above the carina. Nasogastric tube is seen passing through the esophagus into the stomach. Stable bilateral lung opacities are noted, left greater than right, most consistent with pneumonia or possibly edema. No pneumothorax is noted. Probable left pleural effusion cannot be excluded.  Bony thorax is unremarkable. IMPRESSION: Endotracheal and nasogastric tubes are in grossly good position. Stable bilateral lung opacities are noted most consistent with pneumonia or possibly edema. Left pleural effusion cannot be excluded. Electronically Signed   By: Marijo Conception, M.D.   On: 08/09/2017 09:28   Dg Chest Port 1 View  Result Date: 08/08/2017 CLINICAL DATA:  Respiratory failure. EXAM: PORTABLE CHEST 1 VIEW COMPARISON:  August 05, 2017 FINDINGS: There has been progression of airspace consolidation on the left. There is now airspace consolidation throughout most of the left lung. There is a small left pleural effusion. The right lung is clear. There is cardiomegaly with pulmonary venous hypertension. There is aortic atherosclerosis. No adenopathy. No evident bone lesions. IMPRESSION: Consolidation throughout most of the left lung felt to represent widespread pneumonia, progressed from recent study. Right lung clear. Underlying pulmonary vascular congestion. There is aortic atherosclerosis. Aortic  Atherosclerosis (ICD10-I70.0). Electronically Signed   By: Lowella Grip III M.D.   On: 08/08/2017 09:27   Dg Chest Port 1v Same Day  Result Date: 08/12/2017 CLINICAL DATA:  OG tube replacement. EXAM: PORTABLE CHEST 1 VIEW COMPARISON:  08/12/2017 FINDINGS: An endotracheal tube is identified with 2 cm above the carina. And OG tube enters the stomach with tip off the field of view. Cardiomegaly, pulmonary vascular congestion, right basilar atelectasis and left lower lung consolidation/atelectasis again noted. There is no evidence of pneumothorax. IMPRESSION: Support apparatus as described. Otherwise unchanged appearance of the chest. Electronically Signed   By: Margarette Canada M.D.   On: 08/12/2017 11:40    Orson Eva, DO  [facility] Pager [phone]  If 7PM-7AM, please contact night-coverage [email or web address] Password [email or web address] 08/16/2017, 12:14 PM   LOS: 11 days

## 2017-08-16 NOTE — Progress Notes (Signed)
130 mL residual noted from OG tube. Dr. Arbutus Leas notified.

## 2017-08-17 ENCOUNTER — Inpatient Hospital Stay (HOSPITAL_COMMUNITY): Payer: Medicare Other

## 2017-08-17 DIAGNOSIS — I5033 Acute on chronic diastolic (congestive) heart failure: Secondary | ICD-10-CM

## 2017-08-17 DIAGNOSIS — I361 Nonrheumatic tricuspid (valve) insufficiency: Secondary | ICD-10-CM

## 2017-08-17 LAB — CBC WITH DIFFERENTIAL/PLATELET
BASOS ABS: 0 10*3/uL (ref 0.0–0.1)
BASOS PCT: 0 %
Eosinophils Absolute: 0 10*3/uL (ref 0.0–0.7)
Eosinophils Relative: 0 %
HEMATOCRIT: 33.2 % — AB (ref 36.0–46.0)
Hemoglobin: 9.9 g/dL — ABNORMAL LOW (ref 12.0–15.0)
LYMPHS PCT: 9 %
Lymphs Abs: 1.1 10*3/uL (ref 0.7–4.0)
MCH: 31.3 pg (ref 26.0–34.0)
MCHC: 29.8 g/dL — ABNORMAL LOW (ref 30.0–36.0)
MCV: 105.1 fL — AB (ref 78.0–100.0)
MONO ABS: 0.6 10*3/uL (ref 0.1–1.0)
Monocytes Relative: 5 %
NEUTROS ABS: 10.3 10*3/uL — AB (ref 1.7–7.7)
NEUTROS PCT: 86 %
Platelets: 213 10*3/uL (ref 150–400)
RBC: 3.16 MIL/uL — AB (ref 3.87–5.11)
RDW: 15.1 % (ref 11.5–15.5)
WBC: 12 10*3/uL — ABNORMAL HIGH (ref 4.0–10.5)

## 2017-08-17 LAB — URINALYSIS, COMPLETE (UACMP) WITH MICROSCOPIC
BACTERIA UA: NONE SEEN
Bilirubin Urine: NEGATIVE
Glucose, UA: NEGATIVE mg/dL
Ketones, ur: NEGATIVE mg/dL
Nitrite: NEGATIVE
PH: 6 (ref 5.0–8.0)
Protein, ur: 100 mg/dL — AB
SPECIFIC GRAVITY, URINE: 1.02 (ref 1.005–1.030)
Squamous Epithelial / LPF: NONE SEEN

## 2017-08-17 LAB — BLOOD GAS, ARTERIAL
ACID-BASE EXCESS: 7.4 mmol/L — AB (ref 0.0–2.0)
Bicarbonate: 29.7 mmol/L — ABNORMAL HIGH (ref 20.0–28.0)
DRAWN BY: 317771
FIO2: 40
O2 Saturation: 94.1 %
PEEP: 5 cmH2O
RATE: 12 resp/min
VT: 520 mL
pCO2 arterial: 54.8 mmHg — ABNORMAL HIGH (ref 32.0–48.0)
pH, Arterial: 7.389 (ref 7.350–7.450)
pO2, Arterial: 77.5 mmHg — ABNORMAL LOW (ref 83.0–108.0)

## 2017-08-17 LAB — PHOSPHORUS: PHOSPHORUS: 3.9 mg/dL (ref 2.5–4.6)

## 2017-08-17 LAB — GLUCOSE, CAPILLARY
GLUCOSE-CAPILLARY: 272 mg/dL — AB (ref 65–99)
GLUCOSE-CAPILLARY: 218 mg/dL — AB (ref 65–99)
GLUCOSE-CAPILLARY: 180 mg/dL — AB (ref 65–99)
Glucose-Capillary: 164 mg/dL — ABNORMAL HIGH (ref 65–99)
Glucose-Capillary: 272 mg/dL — ABNORMAL HIGH (ref 65–99)
Glucose-Capillary: 60 mg/dL — ABNORMAL LOW (ref 65–99)

## 2017-08-17 LAB — ECHOCARDIOGRAM COMPLETE
HEIGHTINCHES: 68 in
Weight: 3802.49 oz

## 2017-08-17 LAB — BASIC METABOLIC PANEL
ANION GAP: 9 (ref 5–15)
BUN: 47 mg/dL — ABNORMAL HIGH (ref 6–20)
CALCIUM: 8.8 mg/dL — AB (ref 8.9–10.3)
CO2: 32 mmol/L (ref 22–32)
Chloride: 102 mmol/L (ref 101–111)
Creatinine, Ser: 0.89 mg/dL (ref 0.44–1.00)
GLUCOSE: 301 mg/dL — AB (ref 65–99)
POTASSIUM: 3.9 mmol/L (ref 3.5–5.1)
SODIUM: 143 mmol/L (ref 135–145)

## 2017-08-17 LAB — MAGNESIUM: Magnesium: 2.2 mg/dL (ref 1.7–2.4)

## 2017-08-17 LAB — HEMOGLOBIN A1C
HEMOGLOBIN A1C: 9.4 % — AB (ref 4.8–5.6)
MEAN PLASMA GLUCOSE: 223.08 mg/dL

## 2017-08-17 MED ORDER — METHYLPREDNISOLONE SODIUM SUCC 40 MG IJ SOLR
40.0000 mg | Freq: Every day | INTRAMUSCULAR | Status: DC
  Administered 2017-08-18 – 2017-08-22 (×5): 40 mg via INTRAVENOUS
  Filled 2017-08-17 (×5): qty 1

## 2017-08-17 MED ORDER — FUROSEMIDE 10 MG/ML IJ SOLN
40.0000 mg | Freq: Once | INTRAMUSCULAR | Status: AC
  Administered 2017-08-17: 40 mg via INTRAVENOUS
  Filled 2017-08-17: qty 4

## 2017-08-17 MED ORDER — MIDAZOLAM 50MG/50ML (1MG/ML) PREMIX INFUSION
INTRAVENOUS | Status: AC
  Filled 2017-08-17: qty 50

## 2017-08-17 MED ORDER — DEXTROSE 50 % IV SOLN
50.0000 mL | Freq: Once | INTRAVENOUS | Status: AC
  Administered 2017-08-18: 25 mL via INTRAVENOUS

## 2017-08-17 MED ORDER — FENTANYL CITRATE (PF) 2500 MCG/50ML IJ SOLN
INTRAMUSCULAR | Status: AC
  Filled 2017-08-17: qty 50

## 2017-08-17 NOTE — Progress Notes (Signed)
Pt very restless. Moving all over bed and placing hand on ET tube. Fentanyl gtt increased to . Pt now calm in bed.  Family requesting BP to be taken every hour instead of every 15 minutes. BP stable. HR 30's-40's  Will continue to monitor  Genelle Bal, RN

## 2017-08-17 NOTE — Progress Notes (Signed)
Subjective: She attempted weaning yesterday and failed.  She had fairly poor volumes and her blood gas did not look as good as it needs to.  She did a little better with agitation.  This morning she is more awake and moving around.  No other new complaints have been seen.  She still has bradycardia.  Objective: Vital signs in last 24 hours: Temp:  [97.5 F (36.4 C)-98.7 F (37.1 C)] 98 F (36.7 C) (02/14 0400) Pulse Rate:  [38-82] 60 (02/14 0630) Resp:  [5-27] 15 (02/14 0630) BP: (96-147)/(47-115) 138/86 (02/14 0630) SpO2:  [89 %-97 %] 93 % (02/14 0630) FiO2 (%):  [40 %] 40 % (02/14 0433) Weight:  [107.8 kg (237 lb 10.5 oz)] 107.8 kg (237 lb 10.5 oz) (02/14 0500) Weight change:  Last BM Date: 08/16/17  Intake/Output from previous day: 02/13 0701 - 02/14 0700 In: 4903.2 [I.V.:214.2; JY/NW:2956; IV Piggyback:200] Out: 2130 [Urine:3450; Emesis/NG output:216]  PHYSICAL EXAM General appearance: Intubated sedated moving around not really agitated Resp: rhonchi bilaterally Cardio: regular rate and rhythm, S1, S2 normal, no murmur, click, rub or gallop GI: soft, non-tender; bowel sounds normal; no masses,  no organomegaly Extremities: extremities normal, atraumatic, no cyanosis or edema Skin warm and dry  Lab Results:  Results for orders placed or performed during the hospital encounter of 08/05/17 (from the past 48 hour(s))  Glucose, capillary     Status: Abnormal   Collection Time: 08/15/17  7:58 AM  Result Value Ref Range   Glucose-Capillary 321 (H) 65 - 99 mg/dL  Triglycerides     Status: None   Collection Time: 08/15/17  8:47 AM  Result Value Ref Range   Triglycerides 95 <150 mg/dL    Comment: Performed at [facility], [address] [place], [place] [address]  Glucose, capillary     Status: Abnormal   Collection Time: 08/15/17 11:45 AM  Result Value Ref Range   Glucose-Capillary 298 (H) 65 - 99 mg/dL  Glucose, capillary     Status: Abnormal   Collection Time: 08/15/17   4:34 PM  Result Value Ref Range   Glucose-Capillary 285 (H) 65 - 99 mg/dL  Blood gas, arterial     Status: Abnormal   Collection Time: 08/15/17  6:00 PM  Result Value Ref Range   FIO2 40.00    Delivery systems VENTILATOR    Mode CONTINUOUS POSITIVE AIRWAY PRESSURE    Peep/cpap 5.0 cm H20   Pressure support 10 cm H20   pH, Arterial 7.29 (L) 7.350 - 7.450   pCO2 arterial 63.8 (H) 32.0 - 48.0 mmHg   pO2, Arterial 85.2 83.0 - 108.0 mmHg   Bicarbonate 26.4 20.0 - 28.0 mmol/L   Acid-Base Excess 3.6 (H) 0.0 - 2.0 mmol/L   O2 Saturation 94.8 %   Patient temperature 37.0    Collection site RIGHT RADIAL    Drawn by [id number]    Sample type ARTERIAL DRAW    Allens test (pass/fail) PASS PASS    Comment: Performed at [facility], [address] [place], [place] [address]  Glucose, capillary     Status: Abnormal   Collection Time: 08/15/17  7:44 PM  Result Value Ref Range   Glucose-Capillary 333 (H) 65 - 99 mg/dL   Comment 1 Notify RN   Glucose, capillary     Status: Abnormal   Collection Time: 08/16/17 12:11 AM  Result Value Ref Range   Glucose-Capillary 257 (H) 65 - 99 mg/dL   Comment 1 Notify RN   Glucose,  capillary     Status: Abnormal   Collection Time: 08/16/17  3:56 AM  Result Value Ref Range   Glucose-Capillary 196 (H) 65 - 99 mg/dL   Comment 1 Notify RN   Blood gas, arterial     Status: Abnormal   Collection Time: 08/16/17  5:00 AM  Result Value Ref Range   FIO2 40.00    Delivery systems VENTILATOR    Mode PRESSURE REGULATED VOLUME CONTROL    VT 520 mL   LHR 12.0 resp/min   Peep/cpap 5.0 cm H20   pH, Arterial 7.405 7.350 - 7.450   pCO2 arterial 48.9 (H) 32.0 - 48.0 mmHg   pO2, Arterial 81.9 (L) 83.0 - 108.0 mmHg   Bicarbonate 28.5 (H) 20.0 - 28.0 mmol/L   Acid-Base Excess 5.4 (H) 0.0 - 2.0 mmol/L   O2 Saturation 95.6 %   Collection site RIGHT RADIAL    Drawn by [id number]    Sample type ARTERIAL DRAW    Allens test (pass/fail) PASS PASS    Comment: Performed at [facility], [address], [place], [place] [address]  CBC     Status: Abnormal   Collection Time: 08/16/17  5:29 AM  Result Value Ref Range   WBC 14.9 (H) 4.0 - 10.5 K/uL   RBC 3.25 (L) 3.87 - 5.11 MIL/uL   Hemoglobin 10.3 (L) 12.0 - 15.0 g/dL   HCT 34.2 (L) 36.0 - 46.0 %   MCV 105.2 (H) 78.0 - 100.0 fL   MCH 31.7 26.0 - 34.0 pg   MCHC 30.1 30.0 - 36.0 g/dL   RDW 15.1 11.5 - 15.5 %   Platelets 216 150 - 400 K/uL    Comment: Performed at [facility], [address], [place],  [address]  Basic metabolic panel     Status: Abnormal   Collection Time: 08/16/17  5:29 AM  Result Value Ref Range   Sodium 143 135 - 145 mmol/L   Potassium 4.0 3.5 - 5.1 mmol/L   Chloride 106 101 - 111 mmol/L   CO2 29 22 - 32 mmol/L   Glucose, Bld 212 (H) 65 - 99 mg/dL   BUN 49 (H) 6 - 20 mg/dL   Creatinine, Ser 0.84 0.44 - 1.00 mg/dL   Calcium 8.9 8.9 - 10.3 mg/dL   GFR calc non Af Amer >60 >60 mL/min   GFR calc Af Amer >60 >60 mL/min    Comment: (NOTE) The eGFR has been calculated using the CKD EPI equation. This calculation has not been validated in all clinical situations. eGFR's persistently <60 mL/min signify possible Chronic Kidney Disease.    Anion gap 8 5 - 15    Comment: Performed at [facility], [address], [place], [place] [address]  Glucose, capillary     Status: Abnormal   Collection Time: 08/16/17  7:55 AM  Result Value Ref Range   Glucose-Capillary 192 (H) 65 - 99 mg/dL  Blood gas, arterial     Status: Abnormal   Collection Time: 08/16/17  9:20 AM  Result Value Ref Range   FIO2 0.40    Delivery systems VENTILATOR    Mode CONTINUOUS POSITIVE AIRWAY PRESSURE    Peep/cpap 5.0 cm H20   pH, Arterial 7.333 (L) 7.350 - 7.450   pCO2 arterial 59.0 (H) 32.0 - 48.0 mmHg   pO2, Arterial 68.5 (L) 83.0 - 108.0 mmHg   Bicarbonate 27.8 20.0 - 28.0 mmol/L   Acid-Base Excess 4.9 (H) 0.0 - 2.0 mmol/L   O2  Saturation 91.6 %   Collection site RIGHT RADIAL    Drawn by [phone]    Sample type  ARTERIAL    Allens test (pass/fail) PASS PASS    Comment: Performed at [facility], [address], [place], [place] [address]  Glucose, capillary     Status: None   Collection Time: 08/16/17 11:15 AM  Result Value Ref Range   Glucose-Capillary 83 65 - 99 mg/dL  Procalcitonin - Baseline     Status: None   Collection Time: 08/16/17 12:42 PM  Result Value Ref Range   Procalcitonin <0.10 ng/mL    Comment:        Interpretation: PCT (Procalcitonin) <= 0.5 ng/mL: Systemic infection (sepsis) is not likely. Local bacterial infection is possible. (NOTE)       Sepsis PCT Algorithm           Lower Respiratory Tract                                      Infection PCT Algorithm    ----------------------------     ----------------------------         PCT < 0.25 ng/mL                PCT < 0.10 ng/mL         Strongly encourage             Strongly discourage   discontinuation of antibiotics    initiation of antibiotics    ----------------------------     -----------------------------       PCT 0.25 - 0.50 ng/mL            PCT 0.10 - 0.25 ng/mL               OR       >80% decrease in PCT            Discourage initiation of                                            antibiotics      Encourage discontinuation           of antibiotics    ----------------------------     -----------------------------         PCT >= 0.50 ng/mL              PCT 0.26 - 0.50 ng/mL               AND        <80% decrease in PCT             Encourage initiation of                                             antibiotics       Encourage continuation           of antibiotics    ----------------------------     -----------------------------        PCT >= 0.50 ng/mL                  PCT > 0.50 ng/mL  AND         increase in PCT                  Strongly encourage                                      initiation of antibiotics    Strongly encourage escalation           of antibiotics                                      -----------------------------                                           PCT <= 0.25 ng/mL                                                 OR                                        > 80% decrease in PCT                                     Discontinue / Do not initiate                                             antibiotics Performed at [facility], [address], [place], [place] [address]   Brain natriuretic peptide     Status: Abnormal   Collection Time: 08/16/17 12:42 PM  Result Value Ref Range   B Natriuretic Peptide 324.0 (H) 0.0 - 100.0 pg/mL    Comment: Performed at [facility], [address] [place], [place] [address]  Glucose, capillary     Status: Abnormal   Collection Time: 08/16/17  4:59 PM  Result Value Ref Range   Glucose-Capillary 165 (H) 65 - 99 mg/dL  Glucose, capillary     Status: Abnormal   Collection Time: 08/16/17  7:37 PM  Result Value Ref Range   Glucose-Capillary 245 (H) 65 - 99 mg/dL  Glucose, capillary     Status: Abnormal   Collection Time: 08/16/17  9:32 PM  Result Value Ref Range   Glucose-Capillary 281 (H) 65 - 99 mg/dL   Comment 1 Notify RN    Comment 2 Document in Chart   Glucose, capillary     Status: Abnormal   Collection Time: 08/16/17 11:56 PM  Result Value Ref Range   Glucose-Capillary 242 (H) 65 - 99 mg/dL   Comment 1 Notify RN    Comment 2 Document in Chart   Glucose, capillary     Status: Abnormal   Collection Time: 08/17/17  4:07 AM  Result Value Ref Range   Glucose-Capillary 272 (H) 65 - 99 mg/dL  Blood gas, arterial  Status: Abnormal   Collection Time: 08/17/17  4:50 AM  Result Value Ref Range   FIO2 40.00    Delivery systems VENTILATOR    Mode PRESSURE REGULATED VOLUME CONTROL    VT 520 mL   LHR 12.0 resp/min   Peep/cpap 5.0 cm H20   pH, Arterial 7.389 7.350 - 7.450   pCO2 arterial 54.8 (H) 32.0 - 48.0 mmHg   pO2, Arterial 77.5 (L) 83.0 - 108.0 mmHg   Bicarbonate 29.7 (H) 20.0 - 28.0 mmol/L   Acid-Base Excess  7.4 (H) 0.0 - 2.0 mmol/L   O2 Saturation 94.1 %   Collection site RIGHT RADIAL    Drawn by [id number]    Sample type ARTERIAL DRAW    Allens test (pass/fail) PASS PASS    Comment: Performed at [facility], [address], [place], [place] [address]  Basic metabolic panel     Status: Abnormal   Collection Time: 08/17/17  4:57 AM  Result Value Ref Range   Sodium 143 135 - 145 mmol/L   Potassium 3.9 3.5 - 5.1 mmol/L   Chloride 102 101 - 111 mmol/L   CO2 32 22 - 32 mmol/L   Glucose, Bld 301 (H) 65 - 99 mg/dL   BUN 47 (H) 6 - 20 mg/dL   Creatinine, Ser 0.89 0.44 - 1.00 mg/dL   Calcium 8.8 (L) 8.9 - 10.3 mg/dL   GFR calc non Af Amer >60 >60 mL/min   GFR calc Af Amer >60 >60 mL/min    Comment: (NOTE) The eGFR has been calculated using the CKD EPI equation. This calculation has not been validated in all clinical situations. eGFR's persistently <60 mL/min signify possible Chronic Kidney Disease.    Anion gap 9 5 - 15    Comment: Performed at [facility], [address], [place], [place] [address]  CBC with Differential/Platelet     Status: Abnormal   Collection Time: 08/17/17  4:57 AM  Result Value Ref Range   WBC 12.0 (H) 4.0 - 10.5 K/uL   RBC 3.16 (L) 3.87 - 5.11 MIL/uL   Hemoglobin 9.9 (L) 12.0 - 15.0 g/dL   HCT 33.2 (L) 36.0 - 46.0 %   MCV 105.1 (H) 78.0 - 100.0 fL   MCH 31.3 26.0 - 34.0 pg   MCHC 29.8 (L) 30.0 - 36.0 g/dL   RDW 15.1 11.5 - 15.5 %   Platelets 213 150 - 400 K/uL   Neutrophils Relative % 86 %   Neutro Abs 10.3 (H) 1.7 - 7.7 K/uL   Lymphocytes Relative 9 %   Lymphs Abs 1.1 0.7 - 4.0 K/uL   Monocytes Relative 5 %   Monocytes Absolute 0.6 0.1 - 1.0 K/uL   Eosinophils Relative 0 %   Eosinophils Absolute 0.0 0.0 - 0.7 K/uL   Basophils Relative 0 %   Basophils Absolute 0.0 0.0 - 0.1 K/uL    Comment: Performed at [facility], [address], [place], [place] [address]  Magnesium     Status: None   Collection Time: 08/17/17  4:57 AM  Result Value Ref Range    Magnesium 2.2 1.7 - 2.4 mg/dL    Comment: Performed at [facility], [address], [place], [place] [address]  Phosphorus     Status: None   Collection Time: 08/17/17  4:57 AM  Result Value Ref Range   Phosphorus 3.9 2.5 - 4.6 mg/dL    Comment: Performed at [facility], [address], [place], [place] [address]  ABGS Recent Labs    08/17/17 0450  PHART 7.389  PO2ART 77.5*  HCO3 29.7*   CULTURES Recent Results (from the past 240 hour(s))  MRSA PCR Screening     Status: None   Collection Time: 08/10/17  8:35 AM  Result Value Ref Range Status   MRSA by PCR NEGATIVE NEGATIVE Final    Comment:        The GeneXpert MRSA Assay (FDA approved for NASAL specimens only), is one component of a comprehensive MRSA colonization surveillance program. It is not intended to diagnose MRSA infection nor to guide or monitor treatment for MRSA infections. Performed at [facility], [address] [place], [place] [address]    Studies/Results: Dg Chest Port 1 View  Result Date: 08/16/2017 CLINICAL DATA:  Respiratory failure. EXAM: PORTABLE CHEST 1 VIEW COMPARISON:  08/15/2017 FINDINGS: The patient is rotated to the right. Endotracheal tube terminates 4.2 cm above the carina. Enteric tube courses into the upper abdomen with tip not imaged. The cardiac silhouette remains enlarged. Lung volumes remain low with grossly similar appearance of left greater than right basilar opacities suggesting atelectasis. Pulmonary vascular congestion and bilateral interstitial densities have slightly increased. No large pleural effusion or pneumothorax is identified. IMPRESSION: 1. Slightly increased pulmonary edema. 2. Bibasilar atelectasis. Electronically Signed   By: Logan Bores M.D.   On: 08/16/2017 07:48   Dg Chest Port 1 View  Result Date: 08/15/2017 CLINICAL DATA:  Respiratory failure, CHF, COPD EXAM: PORTABLE CHEST 1 VIEW COMPARISON:  Portable chest x-ray of August 14, 2017 FINDINGS: The lungs are  mildly hypoinflated. The interstitial markings are coarse though slightly improved. There are densities at both lung bases which may reflect atelectasis. There is no pleural effusion or pneumothorax. The cardiac silhouette is enlarged. The pulmonary vascularity is mildly engorged but less prominent than on the previous study. There calcification in the wall of the aortic arch. The endotracheal tube tip projects 4.8 cm above the carina. The esophagogastric tube tip and proximal port project below the GE junction. IMPRESSION: Bilateral hypoinflation. Slightly decreased pulmonary interstitial edema. Stable cardiomegaly with decreased pulmonary vascular congestion. Probable bibasilar subsegmental atelectasis. The support tubes are in reasonable position. Thoracic aortic atherosclerosis. Electronically Signed   By: David  Martinique M.D.   On: 08/15/2017 08:40    Medications:  Prior to Admission:  Medications Prior to Admission  Medication Sig Dispense Refill Last Dose  . acetaminophen (TYLENOL) 500 MG tablet Take 500 mg by mouth every 6 (six) hours as needed for moderate pain or headache.   08/05/2017 at 0630  . albuterol (PROVENTIL HFA;VENTOLIN HFA) 108 (90 Base) MCG/ACT inhaler Inhale 2 puffs into the lungs every 4 (four) hours as needed for wheezing or shortness of breath. 1 Inhaler 0 08/04/2017 at Unknown time  . amiodarone (PACERONE) 200 MG tablet Take 1 tablet (200 mg total) by mouth every morning. 90 tablet 3 08/05/2017 at Unknown time  . amoxicillin (AMOXIL) 500 MG capsule Take 1 capsule by mouth 3 (three) times daily.  0 08/05/2017 at 0700  . apixaban (ELIQUIS) 5 MG TABS tablet Take 1 tablet (5 mg total) by mouth 2 (two) times daily. 60 tablet 0 08/05/2017 at Unknown time  . Cyanocobalamin (B-12) 5000 MCG SUBL Take 1 tablet by mouth daily.  12 08/05/2017 at 0700  . dicyclomine (BENTYL) 10 MG capsule TAKE 1 TO 2 CAPSULES BY MOUTH BEFORE meals AS NEEDED 180 capsule 3 08/04/2017 at Unknown time  . DULoxetine  (CYMBALTA) 30 MG capsule Take  30 mg by mouth 2 (two) times daily.   08/05/2017 at 0700  . famotidine (PEPCID) 20 MG tablet Take 20 mg by mouth at bedtime.    08/04/2017 at Unknown time  . furosemide (LASIX) 40 MG tablet TAKE 40 MG DAILY. 90 tablet 3 08/05/2017 at Unknown time  . gabapentin (NEURONTIN) 400 MG capsule Take 400-800 mg by mouth 3 (three) times daily. Take 1 capsule(459m) by mouth twice a day and take 2 capsules(8067m by mouth at bedtime  11 08/05/2017 at 0700  . glipiZIDE (GLUCOTROL) 10 MG tablet Take 10 mg by mouth 2 (two) times daily before a meal.    08/05/2017 at Unknown time  . Insulin Glargine (LANTUS SOLOSTAR) 100 UNIT/ML Solostar Pen Inject 20-50 Units into the skin at bedtime as needed. Patient takes 20 units every night at bedtime if over 178.  Takes 50 units if her blood sugar is above 300.   08/04/2017 at 2200  . levETIRAcetam (KEPPRA) 1000 MG tablet Take 1,000 mg by mouth 2 (two) times daily.  1 08/05/2017 at 0700  . levothyroxine (SYNTHROID, LEVOTHROID) 137 MCG tablet Take 137 mcg by mouth daily.   08/05/2017 at 0700  . loperamide (IMODIUM) 2 MG capsule TAKE ONE CAPSULE BY MOUTH TWICE DAILY AS NEEDED 60 capsule 3 08/04/2017 at Unknown time  . LORazepam (ATIVAN) 1 MG tablet Take 1 mg by mouth 3 (three) times daily as needed for anxiety or sleep. For anxiety   08/04/2017 at 2200  . magnesium oxide (MAG-OX) 400 MG tablet Take 400 mg by mouth daily.    08/05/2017 at Unknown time  . metaxalone (SKELAXIN) 800 MG tablet Take 800 mg by mouth 3 (three) times daily.    08/05/2017 at 0700  . metFORMIN (GLUCOPHAGE) 1000 MG tablet Take 1,000 mg by mouth 2 (two) times daily with a meal.    08/05/2017 at 0700  . Multiple Vitamin (MULTIVITAMIN WITH MINERALS) TABS tablet Take 1 tablet by mouth daily.   08/05/2017 at 0700  . nitroGLYCERIN (NITROSTAT) 0.4 MG SL tablet Place 0.4 mg under the tongue every 5 (five) minutes as needed for chest pain. Reported on 07/08/2015   Taking  . ondansetron (ZOFRAN) 4 MG tablet TAKE ONE  TABLET BY MOUTH FOUR TIMES DAILY FOR NAUSEA (Patient taking differently: TAKE ONE TABLET BY MOUTH FOUR TIMES DAILY FOR NAUSEA. Takes as needed) 120 tablet 3 Past Week at Unknown time  . pantoprazole (PROTONIX) 40 MG tablet TAKE ONE TABLET BY MOUTH TWICE DAILY BEFORE APPLY MEAL 180 tablet 3 08/05/2017 at 0700  . potassium chloride (K-DUR,KLOR-CON) 10 MEQ tablet TAKE TWO TABLETS BY MOUTH TWICE DAILY 120 tablet 11 08/05/2017 at 0700  . promethazine (PHENERGAN) 12.5 MG tablet Take 1 tablet (12.5 mg total) by mouth every 4 (four) hours as needed for nausea or vomiting. 42 tablet 0 Taking  . UNABLE TO FIND Take 1 tablet by mouth 2 (two) times daily. Medication Name: [employer] RX Multivitamin Diabetes Support  12 08/05/2017 at 0700   Scheduled: . amiodarone  200 mg Oral q morning - 10a  . apixaban  5 mg Oral BID  . chlorhexidine gluconate (MEDLINE KIT)  15 mL Mouth Rinse BID  . Chlorhexidine Gluconate Cloth  6 each Topical Daily  . dextromethorphan  30 mg Oral BID  . furosemide  40 mg Oral Daily  . gabapentin  400 mg Oral 2 times per day  . gabapentin  800 mg Oral QHS  . insulin aspart  0-20 Units Subcutaneous Q4H  .  insulin aspart  8 Units Subcutaneous Q4H  . insulin glargine  55 Units Subcutaneous QHS  . ipratropium-albuterol  3 mL Nebulization Q4H  . ketoconazole   Topical BID  . levETIRAcetam  1,000 mg Oral BID  . levothyroxine  75 mcg Intravenous Daily  . magnesium oxide  400 mg Oral Daily  . mouth rinse  15 mL Mouth Rinse QID  . methylPREDNISolone (SOLU-MEDROL) injection  40 mg Intravenous Q12H  . multivitamin  15 mL Oral Daily  . nystatin   Topical TID  . pantoprazole (PROTONIX) IV  40 mg Intravenous Q24H  . potassium chloride  20 mEq Oral Daily  . sodium chloride flush  10-40 mL Intracatheter Q12H   Continuous: . feeding supplement (VITAL HIGH PROTEIN) 1,000 mL (08/15/17 1052)  . fentaNYL infusion INTRAVENOUS 200 mcg/hr (08/17/17 0515)  . midazolam (VERSED) infusion 6 mg/hr (08/16/17  2254)   WRU:EAVWUJWJXBJYN (TYLENOL) oral liquid 160 mg/5 mL, albuterol, fentaNYL, fentaNYL (SUBLIMAZE) injection, loperamide, midazolam, nitroGLYCERIN, ondansetron (ZOFRAN) IV, sodium chloride flush  Assesment: She was admitted with community-acquired pneumonia the outpatient treatment.  She was started on IV treatment but continued to deteriorate did not tolerate BiPAP and ended up with a near "white out" of her left lung which culminated in her being intubated and placed on mechanical ventilation.  She initially required very high flow oxygen but she is now down to 40% with adequate blood gases.  Her PCO2 is up and I think that is probably chronic based on her pH.  Her pneumonia seems to be improving by chest x-ray.  She has COPD at baseline with exacerbation  She seems to have chronic hypercapnic respiratory failure and had acute exacerbation  She has congestive heart failure at baseline but that does not seem to be causing much trouble now.  However her CVP was 17 and she did receive Lasix yesterday.  She has pulmonary hypertension at baseline she is being treated with oxygen  She has sleep apnea which may complicate her situation.  She did not tolerate BiPAP earlier.  She has history of atrial fib but has been in sinus bradycardia on amiodarone  She has cognitive impairment at baseline apparently has not been officially diagnosed with dementia but her daughter says that she does have issues with memory and cognitive impairment.  This may complicate her situation as far as trying to get her off the ventilator. Principal Problem:   CAP Failed Outpatient Tx Active Problems:   Hypothyroidism   Hyperlipidemia   Essential hypertension   Atrial fibrillation (HCC)   Congestive heart failure (HCC)   Gastroparesis   Sleep apnea   Chronic anticoagulation   Tremor   Dementia   DM type 2 (diabetes mellitus, type 2) (HCC)   Chronic diastolic CHF (congestive heart failure) (HCC)   Pulmonary  hypertension (HCC)   COPD exacerbation (HCC)   Acute respiratory failure with hypercapnia (HCC)   Acute respiratory failure with hypoxia and hypercapnia ([place])   Uncontrolled type 2 diabetes mellitus with hyperglycemia ([place])    Plan: Continue current treatments.  May need more Lasix but we will see what CVP is.  Continue weaning attempts.    LOS: 12 days   Avner Stroder L 08/17/2017, 7:16 AM

## 2017-08-17 NOTE — Progress Notes (Signed)
*  PRELIMINARY RESULTS* Echocardiogram 2D Echocardiogram has been performed.  [name] 08/17/2017, 2:29 PM

## 2017-08-17 NOTE — Progress Notes (Signed)
Provider Nelson Chimes made aware/reminded the patient's HR ranges from 35-45 with a sBP > 100. -patient in no apparent distress at this  -no new orders received

## 2017-08-17 NOTE — Progress Notes (Addendum)
PROGRESS NOTE  [name] DJS:[id number] DOB: December 06, 1950 DOA: 08/05/2017 PCP: Lemmie Evens, MD  Brief History:  67 y.o.femalewith multiple medical comorbidities detailed below presented to the emergency department with persistent cough and shortness of breath for the past 2 weeks that failed outpatient management with 2 courses of oral amoxicillin. Patient reports that she is on her second round of amoxicillin and continues to feel short of breath. She has nonproductive cough. She has some wheezing. She has no chest pain. The patient has no swelling in the extremities. She has a congestive heart failure that is chronic and she is taking her diuretics regularly. She denies palpitations. She presented to the ED and noted to be tachypneic. She was afebrile. A chest x-ray was obtained that did show a large left retro-cardiac opacity that was suspicious for pneumonia. EKG was unremarkable except for notation of prolonged PR interval. Her troponin was 0.07. She had a mildly elevated BNP at 189. The patient's blood sugar was elevated at 236. She was given treatment in the emergency department but remained tachypneic.   She was admitted to the hospitaland initially required BiPAP therapy, but then decompensated and required intubation. She has been slow to progress. Weaning trials have been complicated by development of agitation. Pulmonology following.    Assessment/Plan: Acute respiratory failure with hypoxia and hypercapnia -Multifactorial with the main driving factor related to pneumonia -The patient also has COPD and diastolic CHF with likely undiagnosed OSA/OHS -Appreciate pulmonary follow-up -Continue weaning from mechanical ventilation -Continue Solu-Medrol--decrease to once daily -2/14--personally reviewed CXR--interstitial prominence  Lobar Pneumonia -check PCT<0.10 -d/c abx after 2/13 doses--completed 12 days  Acute on Chronic diastolic  CHF -asked RN to set up CVP = 17-->IV lasix -maintain neg fluid balance -check BNP--324.0 -12/26/2015 Echo--normal EF, grade 1 DD, mild MR, PAS P 44 -2/13--Personally reviewed chest x-ray--increased interstitial markings -Daily weights -repeat Echo  Paroxysmal atrial fibrillation -Continue amiodarone -Presently in sinus rhythm -Continue apixaban  Essential hypertension -Stable presently  COPD -No wheezing presently  Diabetes mellitus type 2, uncontrolled with hyperglycemia -Check hemoglobin A1c--pending -Continue Lantus 55 units daily -Continue NovoLog sliding scale -Continue NovoLog 8 units q 4hr -will not change insulin today as weaning steroids  Hypothyroidism -Continue Synthroid   Seizure disorder -Continue Keppra       Disposition Plan:   Remain in ICU Family Communication:   Daughter updateed at bedside 2/14--Total time spent 31 minutes.  Greater than 50% spent face to face counseling and coordinating care.    Consultants:  pulmonary  Code Status:  FULL   DVT Prophylaxis:  apixaban   Procedures: As Listed in Progress Note Above  Antibiotics: Cefepime 2/5>>>2/13 vanc 2/5>>>2/8  The patient is critically ill with multiple organ systems failure and requires high complexity decision making for assessment and support, frequent evaluation and titration of therapies, application of advanced monitoring technologies and extensive interpretation of multiple databases.  Critical care time - 35 mins.      Subjective: Patient remains sedated on mechanical ventilation.  She is tolerating enteral feeds.  There is no report of vomiting, diarrhea, uncontrolled pain, respiratory distress.  Objective: Vitals:   08/17/17 0800 08/17/17 0903 08/17/17 0908 08/17/17 1100  BP:      Pulse:      Resp:      Temp: 97.9 F (36.6 C)   98 F (36.7 C)  TempSrc: Oral   Axillary  SpO2:  93%    Weight:  Height:   '5\' 8"'  (1.727 m)      Intake/Output Summary (Last 24 hours) at 08/17/2017 1244 Last data filed at 08/17/2017 0600 Gross per 24 hour  Intake 4240.79 ml  Output 3640 ml  Net 600.79 ml   Weight change:  Exam:   General:  Pt is sedated on mechanical ventilatation  HEENT: No icterus, No thrush, No neck mass, [place]/AT  Cardiovascular: RRR, S1/S2, no rubs, no gallops  Respiratory: bibasilar crackles, no wheeze  Abdomen: Soft/+BS, non tender, non distended, no guarding  Extremities: trace LE edema, No lymphangitis, No petechiae, No rashes, no synovitis   Data Reviewed: I have personally reviewed following labs and imaging studies Basic Metabolic Panel: Recent Labs  Lab 08/12/17 0425 08/13/17 0437 08/14/17 0434 08/15/17 0431 08/16/17 0529 08/17/17 0457  NA 139 143 141 143 143 143  K 2.8* 3.6 4.4 4.3 4.0 3.9  CL 100* 104 102 105 106 102  CO2 '26 29 29 29 29 ' 32  GLUCOSE 291* 268* 377* 286* 212* 301*  BUN 38* 43* 42* 43* 49* 47*  CREATININE 0.85 0.89 0.88 0.84 0.84 0.89  CALCIUM 7.9* 8.6* 8.7* 8.8* 8.9 8.8*  MG 2.0  --   --   --   --  2.2  PHOS  --   --   --   --   --  3.9   Liver Function Tests: Recent Labs  Lab 08/11/17 0547 08/12/17 0425 08/13/17 0437  AST 34 25 21  ALT '20 19 19  ' ALKPHOS 49 44 40  BILITOT 0.7 0.4 0.7  PROT 6.6 5.8* 6.1*  ALBUMIN 2.6* 2.3* 2.4*   No results for input(s): LIPASE, AMYLASE in the last 168 hours. No results for input(s): AMMONIA in the last 168 hours. Coagulation Profile: No results for input(s): INR, PROTIME in the last 168 hours. CBC: Recent Labs  Lab 08/13/17 0437 08/14/17 0434 08/15/17 0431 08/16/17 0529 08/17/17 0457  WBC 10.2 12.3* 14.5* 14.9* 12.0*  NEUTROABS  --   --   --   --  10.3*  HGB 10.3* 10.0* 10.3* 10.3* 9.9*  HCT 33.4* 33.6* 34.6* 34.2* 33.2*  MCV 102.5* 104.7* 105.5* 105.2* 105.1*  PLT 190 163 198 216 213   Cardiac Enzymes: No results for input(s): CKTOTAL, CKMB, CKMBINDEX, TROPONINI in the last 168 hours. BNP: Invalid  input(s): POCBNP CBG: Recent Labs  Lab 08/16/17 2132 08/16/17 2356 08/17/17 0407 08/17/17 0815 08/17/17 1200  GLUCAP 281* 242* 272* 218* 272*   HbA1C: No results for input(s): HGBA1C in the last 72 hours. Urine analysis:    Component Value Date/Time   COLORURINE YELLOW 02/05/2015 1700   APPEARANCEUR CLEAR 02/05/2015 1700   LABSPEC 1.015 02/05/2015 1700   PHURINE 6.0 02/05/2015 1700   GLUCOSEU NEGATIVE 02/05/2015 1700   HGBUR TRACE (A) 02/05/2015 1700   BILIRUBINUR NEGATIVE 02/05/2015 1700   KETONESUR TRACE (A) 02/05/2015 1700   PROTEINUR NEGATIVE 02/05/2015 1700   UROBILINOGEN 0.2 02/05/2015 1700   NITRITE NEGATIVE 02/05/2015 1700   LEUKOCYTESUR SMALL (A) 02/05/2015 1700   Sepsis Labs: '@LABRCNTIP' (procalcitonin:4,lacticidven:4) ) Recent Results (from the past 240 hour(s))  MRSA PCR Screening     Status: None   Collection Time: 08/10/17  8:35 AM  Result Value Ref Range Status   MRSA by PCR NEGATIVE NEGATIVE Final    Comment:        The GeneXpert MRSA Assay (FDA approved for NASAL specimens only), is one component of a comprehensive MRSA colonization surveillance program. It is not intended  to diagnose MRSA infection nor to guide or monitor treatment for MRSA infections. Performed at [facility], [address], [place], [place] [address]      Scheduled Meds: . amiodarone  200 mg Oral q morning - 10a  . apixaban  5 mg Oral BID  . chlorhexidine gluconate (MEDLINE KIT)  15 mL Mouth Rinse BID  . Chlorhexidine Gluconate Cloth  6 each Topical Daily  . dextromethorphan  30 mg Oral BID  . furosemide  40 mg Oral Daily  . gabapentin  400 mg Oral 2 times per day  . gabapentin  800 mg Oral QHS  . insulin aspart  0-20 Units Subcutaneous Q4H  . insulin aspart  8 Units Subcutaneous Q4H  . insulin glargine  55 Units Subcutaneous QHS  . ipratropium-albuterol  3 mL Nebulization Q4H  . ketoconazole   Topical BID  . levETIRAcetam  1,000 mg Oral BID  . levothyroxine  75 mcg  Intravenous Daily  . magnesium oxide  400 mg Oral Daily  . mouth rinse  15 mL Mouth Rinse QID  . [START ON 08/18/2017] methylPREDNISolone (SOLU-MEDROL) injection  40 mg Intravenous Daily  . multivitamin  15 mL Oral Daily  . nystatin   Topical TID  . pantoprazole (PROTONIX) IV  40 mg Intravenous Q24H  . potassium chloride  20 mEq Oral Daily  . sodium chloride flush  10-40 mL Intracatheter Q12H   Continuous Infusions: . feeding supplement (VITAL HIGH PROTEIN) 1,000 mL (08/17/17 0750)  . fentaNYL infusion INTRAVENOUS 275 mcg/hr (08/17/17 0920)  . midazolam (VERSED) infusion 6 mg/hr (08/17/17 0750)    Procedures/Studies: Dg Chest 2 View  Result Date: 08/05/2017 CLINICAL DATA:  Shortness of breath.  Cough. EXAM: CHEST  2 VIEW COMPARISON:  September 30, 2016 FINDINGS: No pneumothorax. Stable cardiomegaly. The hila and mediastinum are unchanged. There appears to be increased opacity in the left retrocardiac region which could represent pneumonia given history. Recommend follow-up to resolution. IMPRESSION: Left retrocardiac opacity may represent pneumonia given history. Recommend follow-up to resolution. Electronically Signed   By: Dorise Bullion III M.D   On: 08/05/2017 09:43   Dg Chest Port 1 View  Result Date: 08/17/2017 CLINICAL DATA:  Intubation. EXAM: PORTABLE CHEST 1 VIEW COMPARISON:  08/16/2017. FINDINGS: Endotracheal tube and NG tube in stable position. Cardiomegaly with bilateral interstitial prominence consistent pulmonary edema again noted. No interim change. Small left pleural scratched it small pleural effusions can't be excluded. No pneumothorax. IMPRESSION: 1.  Endotracheal tube and NG tube in stable position. 2. Findings consistent congestive heart failure bilateral interstitial edema small pleural effusions. No interim change from prior exam. Electronically Signed   By: Marcello Moores  Register   On: 08/17/2017 05:49   Dg Chest Port 1 View  Result Date: 08/16/2017 CLINICAL DATA:  Respiratory  failure. EXAM: PORTABLE CHEST 1 VIEW COMPARISON:  08/15/2017 FINDINGS: The patient is rotated to the right. Endotracheal tube terminates 4.2 cm above the carina. Enteric tube courses into the upper abdomen with tip not imaged. The cardiac silhouette remains enlarged. Lung volumes remain low with grossly similar appearance of left greater than right basilar opacities suggesting atelectasis. Pulmonary vascular congestion and bilateral interstitial densities have slightly increased. No large pleural effusion or pneumothorax is identified. IMPRESSION: 1. Slightly increased pulmonary edema. 2. Bibasilar atelectasis. Electronically Signed   By: Logan Bores M.D.   On: 08/16/2017 07:48   Dg Chest Port 1 View  Result Date: 08/15/2017 CLINICAL DATA:  Respiratory failure, CHF, COPD EXAM: PORTABLE CHEST 1  VIEW COMPARISON:  Portable chest x-ray of August 14, 2017 FINDINGS: The lungs are mildly hypoinflated. The interstitial markings are coarse though slightly improved. There are densities at both lung bases which may reflect atelectasis. There is no pleural effusion or pneumothorax. The cardiac silhouette is enlarged. The pulmonary vascularity is mildly engorged but less prominent than on the previous study. There calcification in the wall of the aortic arch. The endotracheal tube tip projects 4.8 cm above the carina. The esophagogastric tube tip and proximal port project below the GE junction. IMPRESSION: Bilateral hypoinflation. Slightly decreased pulmonary interstitial edema. Stable cardiomegaly with decreased pulmonary vascular congestion. Probable bibasilar subsegmental atelectasis. The support tubes are in reasonable position. Thoracic aortic atherosclerosis. Electronically Signed   By: Britlyn Martine  [place] M.D.   On: 08/15/2017 08:40   Dg Chest Port 1 View  Result Date: 08/14/2017 CLINICAL DATA:  Respiratory failure EXAM: PORTABLE CHEST 1 VIEW COMPARISON:  Yesterday FINDINGS: Endotracheal tube tip is between the  clavicular heads and carina. An orogastric tube and side-port reaches the stomach. Cardiopericardial enlargement and vascular pedicle widening. There is diffuse haziness of the chest and interstitial opacities. IMPRESSION: 1. Unremarkable positioning of endotracheal and orogastric tubes. 2. CHF pattern.  Pneumonia and atelectasis could be superimposed. Electronically Signed   By: Monte Fantasia M.D.   On: 08/14/2017 06:48   Dg Chest Port 1 View  Result Date: 08/13/2017 CLINICAL DATA:  Respiratory failure EXAM: PORTABLE CHEST 1 VIEW COMPARISON:  08/12/2017 and prior radiographs FINDINGS: Endotracheal tube 2.5 cm above the carina, NG tube entering the stomach with tip off the field of view again noted. Cardiomegaly and bilateral interstitial/airspace opacities, left-greater-than-right, are not significantly changed. Left lower lung consolidation/atelectasis again noted. There is no evidence of pneumothorax. IMPRESSION: Unchanged appearance of the chest with continued bilateral interstitial/airspace opacities/edema and left lower lung consolidation/atelectasis. Electronically Signed   By: Margarette Canada M.D.   On: 08/13/2017 10:00   Dg Chest Port 1 View  Result Date: 08/12/2017 CLINICAL DATA:  Respiratory failure with shortness of breath EXAM: PORTABLE CHEST 1 VIEW COMPARISON:  Yesterday FINDINGS: Endotracheal tube tip between the clavicular heads and carina. Orogastric tube with tip at the GE junction. Cardiopericardial enlargement. There is diffuse hazy opacity of the chest with probable pleural fluid greater on the left. No visible pneumothorax. Artifact from EKG leads. These results will be called to the ordering clinician or representative by the Radiologist Assistant, and communication documented in the PACS or zVision Dashboard. IMPRESSION: 1. Orogastric tube tip at the GE junction. Consider ~5 cm advancement into the stomach. 2. CHF pattern similar to yesterday. Electronically Signed   By: Monte Fantasia  M.D.   On: 08/12/2017 07:52   Dg Chest Port 1 View  Result Date: 08/11/2017 CLINICAL DATA:  Respiratory failure EXAM: PORTABLE CHEST 1 VIEW COMPARISON:  08/10/2017 FINDINGS: Cardiac shadow remains enlarged. Aortic calcifications are again seen. Endotracheal tube and nasogastric catheter are noted in satisfactory position. Diffuse airspace opacities are again identified bilaterally with some improved aeration in the left upper lobe. No bony abnormality is seen. IMPRESSION: Slight improved aeration particularly on the left. Electronically Signed   By: Inez Catalina M.D.   On: 08/11/2017 07:54   Portable Chest Xray  Result Date: 08/10/2017 CLINICAL DATA:  Endotracheal tube EXAM: PORTABLE CHEST 1 VIEW COMPARISON:  08/09/2017 FINDINGS: Endotracheal tube 3 cm above the carina.  NG tube in the stomach Diffuse bilateral airspace disease asymmetric on the left shows mild interval improvement. No significant effusion. IMPRESSION:  Endotracheal tube 3 cm above the carina Severe diffuse bilateral airspace disease with mild interval improvement from yesterday. Electronically Signed   By: Franchot Gallo M.D.   On: 08/10/2017 06:51   Portable Chest X-ray  Result Date: 08/09/2017 CLINICAL DATA:  Endotracheal tube placement. EXAM: PORTABLE CHEST 1 VIEW COMPARISON:  Radiograph of August 08, 2017. FINDINGS: Mild cardiomegaly is noted with central pulmonary vascular congestion. Endotracheal tube is seen projected over tracheal air shadow with distal tip 2 cm above the carina. Nasogastric tube is seen passing through the esophagus into the stomach. Stable bilateral lung opacities are noted, left greater than right, most consistent with pneumonia or possibly edema. No pneumothorax is noted. Probable left pleural effusion cannot be excluded. Bony thorax is unremarkable. IMPRESSION: Endotracheal and nasogastric tubes are in grossly good position. Stable bilateral lung opacities are noted most consistent with pneumonia or possibly  edema. Left pleural effusion cannot be excluded. Electronically Signed   By: Marijo Conception, M.D.   On: 08/09/2017 09:28   Dg Chest Port 1 View  Result Date: 08/08/2017 CLINICAL DATA:  Respiratory failure. EXAM: PORTABLE CHEST 1 VIEW COMPARISON:  August 05, 2017 FINDINGS: There has been progression of airspace consolidation on the left. There is now airspace consolidation throughout most of the left lung. There is a small left pleural effusion. The right lung is clear. There is cardiomegaly with pulmonary venous hypertension. There is aortic atherosclerosis. No adenopathy. No evident bone lesions. IMPRESSION: Consolidation throughout most of the left lung felt to represent widespread pneumonia, progressed from recent study. Right lung clear. Underlying pulmonary vascular congestion. There is aortic atherosclerosis. Aortic Atherosclerosis (ICD10-I70.0). Electronically Signed   By: Lowella Grip III M.D.   On: 08/08/2017 09:27   Dg Chest Port 1v Same Day  Result Date: 08/12/2017 CLINICAL DATA:  OG tube replacement. EXAM: PORTABLE CHEST 1 VIEW COMPARISON:  08/12/2017 FINDINGS: An endotracheal tube is identified with 2 cm above the carina. And OG tube enters the stomach with tip off the field of view. Cardiomegaly, pulmonary vascular congestion, right basilar atelectasis and left lower lung consolidation/atelectasis again noted. There is no evidence of pneumothorax. IMPRESSION: Support apparatus as described. Otherwise unchanged appearance of the chest. Electronically Signed   By: Margarette Canada M.D.   On: 08/12/2017 11:40    Orson Eva, DO  [facility] Pager [phone]  If 7PM-7AM, please contact night-coverage [email or web address] Password [email or web address] 08/17/2017, 12:44 PM   LOS: 12 days

## 2017-08-17 NOTE — Progress Notes (Signed)
Nutrition Follow up  DOCUMENTATION CODES:   Obesity unspecified  INTERVENTION:   Vital High Protein to goal of 55 ml/hr. Providing 1320 kcal, 116 gr protein and 1103 ml water every 24 hr which meets 97% protein and 100% energy goal.   NUTRITION DIAGNOSIS:   Inadequate oral intake related to inability to eat as evidenced by NPO status.  GOAL:   Provide needs based on ASPEN/SCCM guidelines  MONITOR:   Vent status, Labs, I & O's, TF tolerance  ASSESSMENT:  Patient admitted on Saturday 2/2. She has pneumonia, acute respiratory failure. Intubated since 2/6  MV: 6.1 L/min Temp (24hrs), Avg:98 F (36.7 C), Min:97.5 F (36.4 C), Max:98.7 F (37.1 C)  Sedative : fentanyl and versed - increased today due to pt excess movement in bed  Labs and Meds reviewed. Hyperglycemia. Diuretic, PPI, mag-ox, MVI and insulin   Recent Labs  Lab 08/12/17 0425  08/15/17 0431 08/16/17 0529 08/17/17 0457  NA 139   < > 143 143 143  K 2.8*   < > 4.3 4.0 3.9  CL 100*   < > 105 106 102  CO2 26   < > 29 29 32  BUN 38*   < > 43* 49* 47*  CREATININE 0.85   < > 0.84 0.84 0.89  CALCIUM 7.9*   < > 8.8* 8.9 8.8*  MG 2.0  --   --   --  2.2  PHOS  --   --   --   --  3.9  GLUCOSE 291*   < > 286* 212* 301*   < > = values in this interval not displayed.     Intake/Output Summary (Last 24 hours) at 08/17/2017 1010 Last data filed at 08/17/2017 0600 Gross per 24 hour  Intake 4598.21 ml  Output 3640 ml  Net 958.21 ml    Diet Order:  Diet NPO time specified  EDUCATION NEEDS:   No education needs have been identified at this time  Skin:  Skin Assessment: Reviewed RN Assessment  Last BM:  08/16/17  stool Type 6. Pt has rectal tube.MASD groin and perineum.  Height:   Ht Readings from Last 1 Encounters:  08/17/17 5\' 8"  (1.727 m)   Weight:   Wt Readings from Last 1 Encounters:  08/17/17 237 lb 10.5 oz (107.8 kg)  Wt on 2/7 229.15 lb (104.3 kg)   Ideal Body Weight:  64 kg  BMI:  Body mass  index is 36.14 kg/m.  Estimated Nutritional Needs:   Kcal:  2025-4270   Protein:  120-130 gr  Fluid:  per MD goals  Royann Shivers MS,RD,CSG,LDN Office: [phone] Pager: [phone]

## 2017-08-17 NOTE — Progress Notes (Signed)
Inpatient Diabetes Program Recommendations  AACE/ADA: New Consensus Statement on Inpatient Glycemic Control (2015)  Target Ranges:  Prepandial:   less than 140 mg/dL      Peak postprandial:   less than 180 mg/dL (1-2 hours)      Critically ill patients:  140 - 180 mg/dL   Results for LAYLANNI, PEDLEY (MRN [id number]) as of 08/17/2017 10:15  Ref. Range 08/16/2017 07:55 08/16/2017 11:15 08/16/2017 16:59 08/16/2017 19:37 08/16/2017 21:32 08/16/2017 23:56 08/17/2017 04:07 08/17/2017 08:15  Glucose-Capillary Latest Ref Range: 65 - 99 mg/dL 616 (H)  Novolog 12 units 83 165 (H)  Novolog 12 units 245 (H)  Novolog 15 units 281 (H)  Lantus 55 units 242 (H)  Novolog 15 units 272 (H)  Novolog 19 units 218 (H)  Novolog 15 units   Review of Glycemic Control  Diabetes history: DM2 Outpatient Diabetes medications: Lantus 20 units QHS if glucose >178 mg/dl (or takes 50 units if glucose >300 mg/dl), Glipizide 10 mg BID, Metformin 1000 mg BID Current orders for Inpatient glycemic control:Lantus55units QHS,Novolog 0-20 unitsQ4H; Novolog 8 units Q4H for tube feeding coverage; Solumedrol 40 mg Q12H   Inpatient Diabetes Program Recommendations:  Insulin - Basal: If Solumedrol is continued as ordered, please consider increasing Lantus to 60 units QHS. Insulin - Tube Feeding Coverage: Please consider increasing tube feeding coverage to Novolog 10 units Q4H. NURSING: Please note, tube feeding coverage should be held or stopped if tube feeding is on hold or stopped.   Thanks, Orlando Penner, RN, MSN, CDE Diabetes Coordinator Inpatient Diabetes Program [phone] (Team Pager from 8am to 5pm)

## 2017-08-18 ENCOUNTER — Inpatient Hospital Stay (HOSPITAL_COMMUNITY): Payer: Medicare Other

## 2017-08-18 LAB — GLUCOSE, CAPILLARY
GLUCOSE-CAPILLARY: 187 mg/dL — AB (ref 65–99)
Glucose-Capillary: 125 mg/dL — ABNORMAL HIGH (ref 65–99)
Glucose-Capillary: 142 mg/dL — ABNORMAL HIGH (ref 65–99)
Glucose-Capillary: 165 mg/dL — ABNORMAL HIGH (ref 65–99)
Glucose-Capillary: 174 mg/dL — ABNORMAL HIGH (ref 65–99)
Glucose-Capillary: 177 mg/dL — ABNORMAL HIGH (ref 65–99)
Glucose-Capillary: 261 mg/dL — ABNORMAL HIGH (ref 65–99)

## 2017-08-18 LAB — BLOOD GAS, ARTERIAL
ACID-BASE EXCESS: 10.5 mmol/L — AB (ref 0.0–2.0)
BICARBONATE: 31.8 mmol/L — AB (ref 20.0–28.0)
DRAWN BY: 234301
FIO2: 40
O2 SAT: 82.8 %
PATIENT TEMPERATURE: 37
PEEP: 5 cmH2O
PH ART: 7.337 — AB (ref 7.350–7.450)
PO2 ART: 55 mmHg — AB (ref 83.0–108.0)
PRESSURE SUPPORT: 5 cmH2O
pCO2 arterial: 70 mmHg (ref 32.0–48.0)

## 2017-08-18 LAB — CBC
HEMATOCRIT: 31.7 % — AB (ref 36.0–46.0)
HEMOGLOBIN: 9.6 g/dL — AB (ref 12.0–15.0)
MCH: 31.8 pg (ref 26.0–34.0)
MCHC: 30.3 g/dL (ref 30.0–36.0)
MCV: 105 fL — ABNORMAL HIGH (ref 78.0–100.0)
Platelets: 203 10*3/uL (ref 150–400)
RBC: 3.02 MIL/uL — ABNORMAL LOW (ref 3.87–5.11)
RDW: 15.2 % (ref 11.5–15.5)
WBC: 12.1 10*3/uL — AB (ref 4.0–10.5)

## 2017-08-18 LAB — BASIC METABOLIC PANEL
ANION GAP: 9 (ref 5–15)
BUN: 44 mg/dL — ABNORMAL HIGH (ref 6–20)
CALCIUM: 8.6 mg/dL — AB (ref 8.9–10.3)
CO2: 34 mmol/L — ABNORMAL HIGH (ref 22–32)
Chloride: 103 mmol/L (ref 101–111)
Creatinine, Ser: 0.79 mg/dL (ref 0.44–1.00)
GFR calc Af Amer: >60 mL/min (ref >60)
GLUCOSE: 175 mg/dL — AB (ref 65–99)
POTASSIUM: 3.1 mmol/L — AB (ref 3.5–5.1)
Sodium: 146 mmol/L — ABNORMAL HIGH (ref 135–145)

## 2017-08-18 LAB — IRON AND TIBC
IRON: 24 ug/dL — AB (ref 28–170)
Saturation Ratios: 11 % (ref 10.4–31.8)
TIBC: 221 ug/dL — AB (ref 250–450)
UIBC: 197 ug/dL

## 2017-08-18 LAB — TRIGLYCERIDES: TRIGLYCERIDES: 90 mg/dL (ref <150)

## 2017-08-18 LAB — FERRITIN: FERRITIN: 93 ng/mL (ref 11–307)

## 2017-08-18 MED ORDER — MIDAZOLAM 50MG/50ML (1MG/ML) PREMIX INFUSION
INTRAVENOUS | Status: AC
  Filled 2017-08-18: qty 50

## 2017-08-18 MED ORDER — FUROSEMIDE 10 MG/ML IJ SOLN
40.0000 mg | Freq: Every day | INTRAMUSCULAR | Status: DC
  Administered 2017-08-18: 40 mg via INTRAVENOUS
  Filled 2017-08-18: qty 4

## 2017-08-18 MED ORDER — DEXTROSE 50 % IV SOLN
INTRAVENOUS | Status: AC
Start: 2017-08-18 — End: 2017-08-18
  Filled 2017-08-18: qty 50

## 2017-08-18 MED ORDER — POTASSIUM CHLORIDE 20 MEQ/15ML (10%) PO SOLN
40.0000 meq | Freq: Once | ORAL | Status: AC
  Administered 2017-08-18: 40 meq via ORAL
  Filled 2017-08-18: qty 30

## 2017-08-18 MED ORDER — FUROSEMIDE 10 MG/ML IJ SOLN
40.0000 mg | Freq: Two times a day (BID) | INTRAMUSCULAR | Status: DC
  Administered 2017-08-19 – 2017-08-22 (×7): 40 mg via INTRAVENOUS
  Filled 2017-08-18 (×7): qty 4

## 2017-08-18 NOTE — Progress Notes (Signed)
PROGRESS NOTE  [name] JQZ:[id number] DOB: 25-Dec-1950 DOA: 08/05/2017 PCP: Lemmie Evens, MD Brief History: 67 y.o.femalewith multiple medical comorbidities detailed below presented to the emergency department with persistent cough and shortness of breath for the past 2 weeks that failed outpatient management with 2 courses of oral amoxicillin. Patient reports that she is on her second round of amoxicillin and continues to feel short of breath. She has nonproductive cough. She has some wheezing. She has no chest pain. The patient has no swelling in the extremities. She has a congestive heart failure that is chronic and she is taking her diuretics regularly. She denies palpitations. She presented to the ED and noted to be tachypneic. She was afebrile. A chest x-ray was obtained that did show a large left retro-cardiac opacity that was suspicious for pneumonia. EKG was unremarkable except for notation of prolonged PR interval. Her troponin was 0.07. She had a mildly elevated BNP at 189. The patient's blood sugar was elevated at 236. She was given treatment in the emergency department but remained tachypneic.   She was admitted to the hospitaland initially required BiPAP therapy, but then decompensated and required intubation. She has been slow to progress. Weaning trials have been complicated by development of agitation. Pulmonology following.    Assessment/Plan: Acute respiratory failure with hypoxia and hypercapnia -Multifactorial with the main driving factor related to pneumonia -The patient also has COPD and diastolic CHF with likely undiagnosed OSA/OHS -Appreciate pulmonary follow-up -Continue weaning from mechanical ventilation--pt failed wean today -Continue Solu-Medrol--decrease to once daily -2/15--personally reviewed CXR--interstitial prominence -2/15--case discussed with Dr. Luan Pulling  Lobar Pneumonia -check PCT<0.10 -d/c abx after 2/13  doses--completed 12 days  Acute on Chronic diastolic CHF -change to IV lasix -maintain neg fluid balance -2/13 BNP--324.0 -12/26/2015 Echo--normal EF, grade 1 DD, mild MR, PAS P 44 -2/13--Personally reviewed chest x-ray--increased interstitial markings -Daily weights--NEG 5 lbs -repeat Echo--EF 55-60%, grade 2 DD, PAS P 57, mild TR  Paroxysmal atrial fibrillation -Continue amiodarone -Presently in sinus rhythm -Continue apixaban  Essential hypertension -Stable presently  COPD -No wheezing presently  Diabetes mellitus type 2, uncontrolled with hyperglycemia -Check hemoglobin A1c--pending -Continue Lantus 55 units daily -Continue NovoLog sliding scale -Continue NovoLog 8 unitsq 4hr -will not change insulin as CBGs improving with decreased steroids  Hypothyroidism -Continue Synthroid   Seizure disorder -Continue Keppra  Hypokalemia -replete       Disposition Plan:Remain in ICU Family Communication:Daughter updateedat bedside 2/14--Total time spent 32 minutes.  Greater than 50% spent face to face counseling and coordinating care.    Consultants:pulmonary  Code Status: FULL   DVT Prophylaxis:apixaban   Procedures: As Listed in Progress Note Above  Antibiotics: Cefepime 2/5>>>2/13 vanc 2/5>>>2/8  The patient is critically ill with multiple organ systems failure and requires high complexity decision making for assessment and support, frequent evaluation and titration of therapies, application of advanced monitoring technologies and extensive interpretation of multiple databases.  Critical care time - 32 mins.       Subjective: Patient was not able to tolerate weaning on pressure support today.  There is no reports of vomiting, respiratory distress, uncontrolled pain.  She has some loose stool  Objective: Vitals:   08/18/17 1200 08/18/17 1242 08/18/17 1600 08/18/17 1638  BP:      Pulse:      Resp:      Temp: 98 F  (36.7 C)  97.8 F (36.6 C)   TempSrc: Oral  Axillary  SpO2:  95%  92%  Weight:      Height:        Intake/Output Summary (Last 24 hours) at 08/18/2017 1716 Last data filed at 08/18/2017 1701 Gross per 24 hour  Intake 2670.5 ml  Output 3125 ml  Net -454.5 ml   Weight change: -1.8 kg (-15.5 oz) Exam:   General:  Pt is alert, follows commands appropriately, not in acute distress  HEENT: No icterus, No thrush, No neck mass, [place]/AT  Cardiovascular: RRR, S1/S2, no rubs, no gallops  Respiratory: Bilateral rales.  No wheezing.  Abdomen: Soft/+BS, non tender, non distended, no guarding  Extremities: trace LE edema, No lymphangitis, No petechiae, No rashes, no synovitis   Data Reviewed: I have personally reviewed following labs and imaging studies Basic Metabolic Panel: Recent Labs  Lab 08/12/17 0425  08/14/17 0434 08/15/17 0431 08/16/17 0529 08/17/17 0457 08/18/17 0441  NA 139   < > 141 143 143 143 146*  K 2.8*   < > 4.4 4.3 4.0 3.9 3.1*  CL 100*   < > 102 105 106 102 103  CO2 26   < > '29 29 29 ' 32 34*  GLUCOSE 291*   < > 377* 286* 212* 301* 175*  BUN 38*   < > 42* 43* 49* 47* 44*  CREATININE 0.85   < > 0.88 0.84 0.84 0.89 0.79  CALCIUM 7.9*   < > 8.7* 8.8* 8.9 8.8* 8.6*  MG 2.0  --   --   --   --  2.2  --   PHOS  --   --   --   --   --  3.9  --    < > = values in this interval not displayed.   Liver Function Tests: Recent Labs  Lab 08/12/17 0425 08/13/17 0437  AST 25 21  ALT 19 19  ALKPHOS 44 40  BILITOT 0.4 0.7  PROT 5.8* 6.1*  ALBUMIN 2.3* 2.4*   No results for input(s): LIPASE, AMYLASE in the last 168 hours. No results for input(s): AMMONIA in the last 168 hours. Coagulation Profile: No results for input(s): INR, PROTIME in the last 168 hours. CBC: Recent Labs  Lab 08/14/17 0434 08/15/17 0431 08/16/17 0529 08/17/17 0457 08/18/17 0441  WBC 12.3* 14.5* 14.9* 12.0* 12.1*  NEUTROABS  --   --   --  10.3*  --   HGB 10.0* 10.3* 10.3* 9.9* 9.6*  HCT  33.6* 34.6* 34.2* 33.2* 31.7*  MCV 104.7* 105.5* 105.2* 105.1* 105.0*  PLT 163 198 216 213 203   Cardiac Enzymes: No results for input(s): CKTOTAL, CKMB, CKMBINDEX, TROPONINI in the last 168 hours. BNP: Invalid input(s): POCBNP CBG: Recent Labs  Lab 08/18/17 0124 08/18/17 0456 08/18/17 0836 08/18/17 1144 08/18/17 1611  GLUCAP 125* 174* 187* 142* 165*   HbA1C: Recent Labs    08/16/17 0529  HGBA1C 9.4*   Urine analysis:    Component Value Date/Time   COLORURINE AMBER (A) 08/17/2017 1600   APPEARANCEUR CLOUDY (A) 08/17/2017 1600   LABSPEC 1.020 08/17/2017 1600   PHURINE 6.0 08/17/2017 1600   GLUCOSEU NEGATIVE 08/17/2017 1600   HGBUR LARGE (A) 08/17/2017 1600   BILIRUBINUR NEGATIVE 08/17/2017 1600   KETONESUR NEGATIVE 08/17/2017 1600   PROTEINUR 100 (A) 08/17/2017 1600   UROBILINOGEN 0.2 02/05/2015 1700   NITRITE NEGATIVE 08/17/2017 1600   LEUKOCYTESUR MODERATE (A) 08/17/2017 1600   Sepsis Labs: '@LABRCNTIP' (procalcitonin:4,lacticidven:4) ) Recent Results (from the past 240 hour(s))  MRSA PCR Screening  Status: None   Collection Time: 08/10/17  8:35 AM  Result Value Ref Range Status   MRSA by PCR NEGATIVE NEGATIVE Final    Comment:        The GeneXpert MRSA Assay (FDA approved for NASAL specimens only), is one component of a comprehensive MRSA colonization surveillance program. It is not intended to diagnose MRSA infection nor to guide or monitor treatment for MRSA infections. Performed at [facility], [address] [place], [place] [address]      Scheduled Meds: . amiodarone  200 mg Oral q morning - 10a  . apixaban  5 mg Oral BID  . chlorhexidine gluconate (MEDLINE KIT)  15 mL Mouth Rinse BID  . Chlorhexidine Gluconate Cloth  6 each Topical Daily  . dextromethorphan  30 mg Oral BID  . furosemide  40 mg Intravenous Daily  . gabapentin  400 mg Oral 2 times per day  . gabapentin  800 mg Oral QHS  . insulin aspart  0-20 Units Subcutaneous Q4H  .  insulin aspart  8 Units Subcutaneous Q4H  . insulin glargine  55 Units Subcutaneous QHS  . ipratropium-albuterol  3 mL Nebulization Q4H  . ketoconazole   Topical BID  . levETIRAcetam  1,000 mg Oral BID  . levothyroxine  75 mcg Intravenous Daily  . magnesium oxide  400 mg Oral Daily  . mouth rinse  15 mL Mouth Rinse QID  . methylPREDNISolone (SOLU-MEDROL) injection  40 mg Intravenous Daily  . multivitamin  15 mL Oral Daily  . nystatin   Topical TID  . pantoprazole (PROTONIX) IV  40 mg Intravenous Q24H  . potassium chloride  20 mEq Oral Daily  . sodium chloride flush  10-40 mL Intracatheter Q12H   Continuous Infusions: . feeding supplement (VITAL HIGH PROTEIN) 1,000 mL (08/18/17 0450)  . fentaNYL infusion INTRAVENOUS 175 mcg/hr (08/18/17 1619)  . midazolam (VERSED) infusion 6 mg/hr (08/18/17 1059)    Procedures/Studies: Dg Chest 2 View  Result Date: 08/05/2017 CLINICAL DATA:  Shortness of breath.  Cough. EXAM: CHEST  2 VIEW COMPARISON:  September 30, 2016 FINDINGS: No pneumothorax. Stable cardiomegaly. The hila and mediastinum are unchanged. There appears to be increased opacity in the left retrocardiac region which could represent pneumonia given history. Recommend follow-up to resolution. IMPRESSION: Left retrocardiac opacity may represent pneumonia given history. Recommend follow-up to resolution. Electronically Signed   By: Dorise Bullion III M.D   On: 08/05/2017 09:43   Dg Chest Port 1 View  Result Date: 08/18/2017 CLINICAL DATA:  Hypoxia EXAM: PORTABLE CHEST 1 VIEW COMPARISON:  August 17, 2017 FINDINGS: Endotracheal tube tip is 3.5 cm above the carina. Nasogastric tube tip and side port are below the diaphragm in the stomach. No pneumothorax. There are small pleural effusions bilaterally. There is interstitial pulmonary edema. There is atelectatic change in both lung bases. There is cardiomegaly with pulmonary venous hypertension. There is aortic atherosclerosis. No adenopathy. No bone  lesions. IMPRESSION: Tube positions as described without pneumothorax. Pulmonary vascular congestion with interstitial edema and small pleural effusions bilaterally. Appearance radiographically is indicative of congestive heart failure. There is bibasilar atelectasis. Electronically Signed   By: Lowella Grip III M.D.   On: 08/18/2017 07:08   Dg Chest Port 1 View  Result Date: 08/17/2017 CLINICAL DATA:  Intubation. EXAM: PORTABLE CHEST 1 VIEW COMPARISON:  08/16/2017. FINDINGS: Endotracheal tube and NG tube in stable position. Cardiomegaly with bilateral interstitial prominence consistent pulmonary edema again noted. No interim change. Small left pleural scratched it  small pleural effusions can't be excluded. No pneumothorax. IMPRESSION: 1.  Endotracheal tube and NG tube in stable position. 2. Findings consistent congestive heart failure bilateral interstitial edema small pleural effusions. No interim change from prior exam. Electronically Signed   By: Marcello Moores  Register   On: 08/17/2017 05:49   Dg Chest Port 1 View  Result Date: 08/16/2017 CLINICAL DATA:  Respiratory failure. EXAM: PORTABLE CHEST 1 VIEW COMPARISON:  08/15/2017 FINDINGS: The patient is rotated to the right. Endotracheal tube terminates 4.2 cm above the carina. Enteric tube courses into the upper abdomen with tip not imaged. The cardiac silhouette remains enlarged. Lung volumes remain low with grossly similar appearance of left greater than right basilar opacities suggesting atelectasis. Pulmonary vascular congestion and bilateral interstitial densities have slightly increased. No large pleural effusion or pneumothorax is identified. IMPRESSION: 1. Slightly increased pulmonary edema. 2. Bibasilar atelectasis. Electronically Signed   By: Logan Bores M.D.   On: 08/16/2017 07:48   Dg Chest Port 1 View  Result Date: 08/15/2017 CLINICAL DATA:  Respiratory failure, CHF, COPD EXAM: PORTABLE CHEST 1 VIEW COMPARISON:  Portable chest x-ray of  August 14, 2017 FINDINGS: The lungs are mildly hypoinflated. The interstitial markings are coarse though slightly improved. There are densities at both lung bases which may reflect atelectasis. There is no pleural effusion or pneumothorax. The cardiac silhouette is enlarged. The pulmonary vascularity is mildly engorged but less prominent than on the previous study. There calcification in the wall of the aortic arch. The endotracheal tube tip projects 4.8 cm above the carina. The esophagogastric tube tip and proximal port project below the GE junction. IMPRESSION: Bilateral hypoinflation. Slightly decreased pulmonary interstitial edema. Stable cardiomegaly with decreased pulmonary vascular congestion. Probable bibasilar subsegmental atelectasis. The support tubes are in reasonable position. Thoracic aortic atherosclerosis. Electronically Signed   By: Haivyn Oravec  [place] M.D.   On: 08/15/2017 08:40   Dg Chest Port 1 View  Result Date: 08/14/2017 CLINICAL DATA:  Respiratory failure EXAM: PORTABLE CHEST 1 VIEW COMPARISON:  Yesterday FINDINGS: Endotracheal tube tip is between the clavicular heads and carina. An orogastric tube and side-port reaches the stomach. Cardiopericardial enlargement and vascular pedicle widening. There is diffuse haziness of the chest and interstitial opacities. IMPRESSION: 1. Unremarkable positioning of endotracheal and orogastric tubes. 2. CHF pattern.  Pneumonia and atelectasis could be superimposed. Electronically Signed   By: Monte Fantasia M.D.   On: 08/14/2017 06:48   Dg Chest Port 1 View  Result Date: 08/13/2017 CLINICAL DATA:  Respiratory failure EXAM: PORTABLE CHEST 1 VIEW COMPARISON:  08/12/2017 and prior radiographs FINDINGS: Endotracheal tube 2.5 cm above the carina, NG tube entering the stomach with tip off the field of view again noted. Cardiomegaly and bilateral interstitial/airspace opacities, left-greater-than-right, are not significantly changed. Left lower lung  consolidation/atelectasis again noted. There is no evidence of pneumothorax. IMPRESSION: Unchanged appearance of the chest with continued bilateral interstitial/airspace opacities/edema and left lower lung consolidation/atelectasis. Electronically Signed   By: Margarette Canada M.D.   On: 08/13/2017 10:00   Dg Chest Port 1 View  Result Date: 08/12/2017 CLINICAL DATA:  Respiratory failure with shortness of breath EXAM: PORTABLE CHEST 1 VIEW COMPARISON:  Yesterday FINDINGS: Endotracheal tube tip between the clavicular heads and carina. Orogastric tube with tip at the GE junction. Cardiopericardial enlargement. There is diffuse hazy opacity of the chest with probable pleural fluid greater on the left. No visible pneumothorax. Artifact from EKG leads. These results will be called to the ordering clinician or representative by the  Psychologist, clinical, and communication documented in the PACS or zVision Dashboard. IMPRESSION: 1. Orogastric tube tip at the GE junction. Consider ~5 cm advancement into the stomach. 2. CHF pattern similar to yesterday. Electronically Signed   By: Monte Fantasia M.D.   On: 08/12/2017 07:52   Dg Chest Port 1 View  Result Date: 08/11/2017 CLINICAL DATA:  Respiratory failure EXAM: PORTABLE CHEST 1 VIEW COMPARISON:  08/10/2017 FINDINGS: Cardiac shadow remains enlarged. Aortic calcifications are again seen. Endotracheal tube and nasogastric catheter are noted in satisfactory position. Diffuse airspace opacities are again identified bilaterally with some improved aeration in the left upper lobe. No bony abnormality is seen. IMPRESSION: Slight improved aeration particularly on the left. Electronically Signed   By: Inez Catalina M.D.   On: 08/11/2017 07:54   Portable Chest Xray  Result Date: 08/10/2017 CLINICAL DATA:  Endotracheal tube EXAM: PORTABLE CHEST 1 VIEW COMPARISON:  08/09/2017 FINDINGS: Endotracheal tube 3 cm above the carina.  NG tube in the stomach Diffuse bilateral airspace disease  asymmetric on the left shows mild interval improvement. No significant effusion. IMPRESSION: Endotracheal tube 3 cm above the carina Severe diffuse bilateral airspace disease with mild interval improvement from yesterday. Electronically Signed   By: Franchot Gallo M.D.   On: 08/10/2017 06:51   Portable Chest X-ray  Result Date: 08/09/2017 CLINICAL DATA:  Endotracheal tube placement. EXAM: PORTABLE CHEST 1 VIEW COMPARISON:  Radiograph of August 08, 2017. FINDINGS: Mild cardiomegaly is noted with central pulmonary vascular congestion. Endotracheal tube is seen projected over tracheal air shadow with distal tip 2 cm above the carina. Nasogastric tube is seen passing through the esophagus into the stomach. Stable bilateral lung opacities are noted, left greater than right, most consistent with pneumonia or possibly edema. No pneumothorax is noted. Probable left pleural effusion cannot be excluded. Bony thorax is unremarkable. IMPRESSION: Endotracheal and nasogastric tubes are in grossly good position. Stable bilateral lung opacities are noted most consistent with pneumonia or possibly edema. Left pleural effusion cannot be excluded. Electronically Signed   By: Marijo Conception, M.D.   On: 08/09/2017 09:28   Dg Chest Port 1 View  Result Date: 08/08/2017 CLINICAL DATA:  Respiratory failure. EXAM: PORTABLE CHEST 1 VIEW COMPARISON:  August 05, 2017 FINDINGS: There has been progression of airspace consolidation on the left. There is now airspace consolidation throughout most of the left lung. There is a small left pleural effusion. The right lung is clear. There is cardiomegaly with pulmonary venous hypertension. There is aortic atherosclerosis. No adenopathy. No evident bone lesions. IMPRESSION: Consolidation throughout most of the left lung felt to represent widespread pneumonia, progressed from recent study. Right lung clear. Underlying pulmonary vascular congestion. There is aortic atherosclerosis. Aortic  Atherosclerosis (ICD10-I70.0). Electronically Signed   By: Lowella Grip III M.D.   On: 08/08/2017 09:27   Dg Chest Port 1v Same Day  Result Date: 08/17/2017 CLINICAL DATA:  Check gastric catheter placement EXAM: PORTABLE CHEST 1 VIEW COMPARISON:  08/17/2017 FINDINGS: Cardiac shadow is enlarged but stable. Endotracheal tube is noted in satisfactory position. Nasogastric catheter is noted tip within the stomach. Lungs are well aerated with vascular congestion and interstitial edema similar that seen prior exam. IMPRESSION: The overall appearance of the chest is stable from the prior exam. Electronically Signed   By: Inez Catalina M.D.   On: 08/17/2017 21:24   Dg Chest Port 1v Same Day  Result Date: 08/12/2017 CLINICAL DATA:  OG tube replacement. EXAM: PORTABLE CHEST 1 VIEW COMPARISON:  08/12/2017 FINDINGS: An endotracheal tube is identified with 2 cm above the carina. And OG tube enters the stomach with tip off the field of view. Cardiomegaly, pulmonary vascular congestion, right basilar atelectasis and left lower lung consolidation/atelectasis again noted. There is no evidence of pneumothorax. IMPRESSION: Support apparatus as described. Otherwise unchanged appearance of the chest. Electronically Signed   By: Margarette Canada M.D.   On: 08/12/2017 11:40    Orson Eva, DO  [facility] Pager [phone]  If 7PM-7AM, please contact night-coverage [email or web address] Password [email or web address] 08/18/2017, 5:16 PM   LOS: 13 days

## 2017-08-18 NOTE — Progress Notes (Signed)
RT attempted to wean patient today, patient appeared to be tolerating well. ABG was performed and results where abnormal. Patient PCO2 70 and PAO2 55.  AT 1135hr Wean was discontinued and patient was placed back on rest mode, no further attempts where made. RT will continue to monitor.

## 2017-08-18 NOTE — Progress Notes (Signed)
Inpatient Diabetes Program Recommendations  AACE/ADA: New Consensus Statement on Inpatient Glycemic Control (2015)  Target Ranges:  Prepandial:   less than 140 mg/dL      Peak postprandial:   less than 180 mg/dL (1-2 hours)      Critically ill patients:  140 - 180 mg/dL   Lab Results  Component Value Date   GLUCAP 187 (H) 08/18/2017   HGBA1C 9.4 (H) 08/16/2017    Review of Glycemic Control Results for OBERIA, COLICCHIO (MRN [id number]) as of 08/18/2017 11:29  Ref. Range 08/17/2017 23:54 08/18/2017 01:24 08/18/2017 04:56 08/18/2017 08:36  Glucose-Capillary Latest Ref Range: 65 - 99 mg/dL 60 (L) 235 (H) 573 (H) 187 (H)   Diabetes history:DM2 Outpatient Diabetes medications:Lantus 20 units QHS if glucose >178 mg/dl (or takes 50 units if glucose >300 mg/dl), Glipizide 10 mg BID, Metformin 1000 mg BID Current orders for Inpatient glycemic control:Lantus55units QHS,Novolog 0-20 unitsQ4H; Novolog 8 units Q4H for tube feeding coverage; Solumedrol 40 mg Q12H    Inpatient Diabetes Program Recommendations:  Noted hypoglycemic event of 60 mg/dL. Consider decreasing Novolog correction to 0-15 Units [facility]. Insulin may need to be adjusted with decrease of steroids.  Thanks, Lujean Rave, MSN, RNC-OB Diabetes Coordinator [phone] (8a-5p)

## 2017-08-18 NOTE — Progress Notes (Signed)
Subjective: She remains intubated and on the ventilator.  She became agitated last night but is calm now.  Echocardiogram yesterday shows diastolic dysfunction.  Objective: Vital signs in last 24 hours: Temp:  [97.6 F (36.4 C)-99 F (37.2 C)] 98.1 F (36.7 C) (02/15 0400) Pulse Rate:  [36-52] 40 (02/15 0600) Resp:  [10-19] 14 (02/15 0600) BP: (84-124)/(40-76) 103/53 (02/15 0600) SpO2:  [91 %-97 %] 96 % (02/15 0600) FiO2 (%):  [40 %-45 %] 40 % (02/15 0422) Weight:  [106 kg (233 lb 11 oz)] 106 kg (233 lb 11 oz) (02/15 0500) Weight change: -1.8 kg (-15.5 oz) Last BM Date: 08/17/17  Intake/Output from previous day: 02/14 0701 - 02/15 0700 In: 2670.5 [I.V.:1195.5; NG/GT:1475] Out: 3025 [Urine:3025]  PHYSICAL EXAM General appearance: Intubated sedated on mechanical ventilation Resp: Still has rales diffusely bilaterally but less than previously Cardio: Regular with bradycardia GI: soft, non-tender; bowel sounds normal; no masses,  no organomegaly Extremities: extremities normal, atraumatic, no cyanosis or edema Skin turgor good  Lab Results:  Results for orders placed or performed during the hospital encounter of 08/05/17 (from the past 48 hour(s))  Glucose, capillary     Status: Abnormal   Collection Time: 08/16/17  7:55 AM  Result Value Ref Range   Glucose-Capillary 192 (H) 65 - 99 mg/dL  Blood gas, arterial     Status: Abnormal   Collection Time: 08/16/17  9:20 AM  Result Value Ref Range   FIO2 0.40    Delivery systems VENTILATOR    Mode CONTINUOUS POSITIVE AIRWAY PRESSURE    Peep/cpap 5.0 cm H20   pH, Arterial 7.333 (L) 7.350 - 7.450   pCO2 arterial 59.0 (H) 32.0 - 48.0 mmHg   pO2, Arterial 68.5 (L) 83.0 - 108.0 mmHg   Bicarbonate 27.8 20.0 - 28.0 mmol/L   Acid-Base Excess 4.9 (H) 0.0 - 2.0 mmol/L   O2 Saturation 91.6 %   Collection site RIGHT RADIAL    Drawn by [phone]    Sample type ARTERIAL    Allens test (pass/fail) PASS PASS    Comment: Performed at [facility], [address], [place], [place] [address]  Glucose, capillary     Status: None   Collection Time: 08/16/17 11:15 AM  Result Value Ref Range   Glucose-Capillary 83 65 - 99 mg/dL  Procalcitonin - Baseline     Status: None   Collection Time: 08/16/17 12:42 PM  Result Value Ref Range   Procalcitonin <0.10 ng/mL    Comment:        Interpretation: PCT (Procalcitonin) <= 0.5 ng/mL: Systemic infection (sepsis) is not likely. Local bacterial infection is possible. (NOTE)       Sepsis PCT Algorithm           Lower Respiratory Tract                                      Infection PCT Algorithm    ----------------------------     ----------------------------         PCT < 0.25 ng/mL                PCT < 0.10 ng/mL         Strongly encourage             Strongly discourage   discontinuation of antibiotics    initiation of antibiotics    ----------------------------     -----------------------------  PCT 0.25 - 0.50 ng/mL            PCT 0.10 - 0.25 ng/mL               OR       >80% decrease in PCT            Discourage initiation of                                            antibiotics      Encourage discontinuation           of antibiotics    ----------------------------     -----------------------------         PCT >= 0.50 ng/mL              PCT 0.26 - 0.50 ng/mL               AND        <80% decrease in PCT             Encourage initiation of                                             antibiotics       Encourage continuation           of antibiotics    ----------------------------     -----------------------------        PCT >= 0.50 ng/mL                  PCT > 0.50 ng/mL               AND         increase in PCT                  Strongly encourage                                      initiation of antibiotics    Strongly encourage escalation           of antibiotics                                     -----------------------------                                            PCT <= 0.25 ng/mL                                                 OR                                        > 80% decrease in PCT  Discontinue / Do not initiate                                             antibiotics Performed at [facility], [address], [place], [place] [address]   Brain natriuretic peptide     Status: Abnormal   Collection Time: 08/16/17 12:42 PM  Result Value Ref Range   B Natriuretic Peptide 324.0 (H) 0.0 - 100.0 pg/mL    Comment: Performed at [facility], [address], [place], [place] [address]  Glucose, capillary     Status: Abnormal   Collection Time: 08/16/17  4:59 PM  Result Value Ref Range   Glucose-Capillary 165 (H) 65 - 99 mg/dL  Glucose, capillary     Status: Abnormal   Collection Time: 08/16/17  7:37 PM  Result Value Ref Range   Glucose-Capillary 245 (H) 65 - 99 mg/dL  Glucose, capillary     Status: Abnormal   Collection Time: 08/16/17  9:32 PM  Result Value Ref Range   Glucose-Capillary 281 (H) 65 - 99 mg/dL   Comment 1 Notify RN    Comment 2 Document in Chart   Glucose, capillary     Status: Abnormal   Collection Time: 08/16/17 11:56 PM  Result Value Ref Range   Glucose-Capillary 242 (H) 65 - 99 mg/dL   Comment 1 Notify RN    Comment 2 Document in Chart   Glucose, capillary     Status: Abnormal   Collection Time: 08/17/17  4:07 AM  Result Value Ref Range   Glucose-Capillary 272 (H) 65 - 99 mg/dL  Blood gas, arterial     Status: Abnormal   Collection Time: 08/17/17  4:50 AM  Result Value Ref Range   FIO2 40.00    Delivery systems VENTILATOR    Mode PRESSURE REGULATED VOLUME CONTROL    VT 520 mL   LHR 12.0 resp/min   Peep/cpap 5.0 cm H20   pH, Arterial 7.389 7.350 - 7.450   pCO2 arterial 54.8 (H) 32.0 - 48.0 mmHg   pO2, Arterial 77.5 (L) 83.0 - 108.0 mmHg   Bicarbonate 29.7 (H) 20.0 - 28.0 mmol/L   Acid-Base Excess 7.4 (H) 0.0 - 2.0 mmol/L   O2 Saturation 94.1 %   Collection site RIGHT RADIAL     Drawn by [id number]    Sample type ARTERIAL DRAW    Allens test (pass/fail) PASS PASS    Comment: Performed at [facility], [address], [place], [place] [address]  Basic metabolic panel     Status: Abnormal   Collection Time: 08/17/17  4:57 AM  Result Value Ref Range   Sodium 143 135 - 145 mmol/L   Potassium 3.9 3.5 - 5.1 mmol/L   Chloride 102 101 - 111 mmol/L   CO2 32 22 - 32 mmol/L   Glucose, Bld 301 (H) 65 - 99 mg/dL   BUN 47 (H) 6 - 20 mg/dL   Creatinine, Ser 0.89 0.44 - 1.00 mg/dL   Calcium 8.8 (L) 8.9 - 10.3 mg/dL   GFR calc non Af Amer >60 >60 mL/min   GFR calc Af Amer >60 >60 mL/min    Comment: (NOTE) The eGFR has been calculated using the CKD EPI equation. This calculation has not been validated in all clinical situations. eGFR's persistently <60 mL/min signify possible Chronic Kidney Disease.    Anion gap 9 5 -  15    Comment: Performed at [facility], [address], [place], [place] [address]  CBC with Differential/Platelet     Status: Abnormal   Collection Time: 08/17/17  4:57 AM  Result Value Ref Range   WBC 12.0 (H) 4.0 - 10.5 K/uL   RBC 3.16 (L) 3.87 - 5.11 MIL/uL   Hemoglobin 9.9 (L) 12.0 - 15.0 g/dL   HCT 33.2 (L) 36.0 - 46.0 %   MCV 105.1 (H) 78.0 - 100.0 fL   MCH 31.3 26.0 - 34.0 pg   MCHC 29.8 (L) 30.0 - 36.0 g/dL   RDW 15.1 11.5 - 15.5 %   Platelets 213 150 - 400 K/uL   Neutrophils Relative % 86 %   Neutro Abs 10.3 (H) 1.7 - 7.7 K/uL   Lymphocytes Relative 9 %   Lymphs Abs 1.1 0.7 - 4.0 K/uL   Monocytes Relative 5 %   Monocytes Absolute 0.6 0.1 - 1.0 K/uL   Eosinophils Relative 0 %   Eosinophils Absolute 0.0 0.0 - 0.7 K/uL   Basophils Relative 0 %   Basophils Absolute 0.0 0.0 - 0.1 K/uL    Comment: Performed at [facility], [address] [place], [place] [address]  Magnesium     Status: None   Collection Time: 08/17/17  4:57 AM  Result Value Ref Range   Magnesium 2.2 1.7 - 2.4 mg/dL    Comment: Performed at [facility], [address], [place], [place] [address]  Phosphorus     Status: None   Collection Time: 08/17/17  4:57 AM  Result Value Ref Range   Phosphorus 3.9 2.5 - 4.6 mg/dL    Comment: Performed at [facility], [address], [place], [place] [address]  Glucose, capillary     Status: Abnormal   Collection Time: 08/17/17  8:15 AM  Result Value Ref Range   Glucose-Capillary 218 (H) 65 - 99 mg/dL  Glucose, capillary     Status: Abnormal   Collection Time: 08/17/17 12:00 PM  Result Value Ref Range   Glucose-Capillary 272 (H) 65 - 99 mg/dL  Urinalysis, Complete w Microscopic     Status: Abnormal   Collection Time: 08/17/17  4:00 PM  Result Value Ref Range   Color, Urine AMBER (A) YELLOW    Comment: BIOCHEMICALS MAY BE AFFECTED BY COLOR   APPearance CLOUDY (A) CLEAR   Specific Gravity, Urine 1.020 1.005 - 1.030   pH 6.0 5.0 - 8.0   Glucose, UA NEGATIVE NEGATIVE mg/dL   Hgb urine dipstick LARGE (A) NEGATIVE   Bilirubin Urine NEGATIVE NEGATIVE   Ketones, ur NEGATIVE NEGATIVE mg/dL   Protein, ur 100 (A) NEGATIVE mg/dL   Nitrite NEGATIVE NEGATIVE   Leukocytes, UA MODERATE (A) NEGATIVE   RBC / HPF TOO NUMEROUS TO COUNT 0 - 5 RBC/hpf   WBC, UA 6-30 0 - 5 WBC/hpf   Bacteria, UA NONE SEEN NONE SEEN   Squamous Epithelial / LPF NONE SEEN NONE SEEN    Comment: Performed at [facility], [address] [place], [place] [address]  Glucose, capillary     Status: Abnormal   Collection Time: 08/17/17  5:21 PM  Result Value Ref Range   Glucose-Capillary 180 (H) 65 - 99 mg/dL  Glucose, capillary     Status: Abnormal   Collection Time: 08/17/17  7:59 PM  Result Value Ref Range   Glucose-Capillary 164 (H) 65 - 99 mg/dL  Glucose, capillary     Status: Abnormal   Collection Time:  08/17/17 11:54 PM  Result Value Ref Range   Glucose-Capillary 60 (L) 65 - 99 mg/dL  Glucose, capillary     Status: Abnormal   Collection Time: 08/18/17  1:24 AM  Result Value Ref Range   Glucose-Capillary 125 (H) 65 - 99 mg/dL  CBC      Status: Abnormal   Collection Time: 08/18/17  4:41 AM  Result Value Ref Range   WBC 12.1 (H) 4.0 - 10.5 K/uL   RBC 3.02 (L) 3.87 - 5.11 MIL/uL   Hemoglobin 9.6 (L) 12.0 - 15.0 g/dL   HCT 31.7 (L) 36.0 - 46.0 %   MCV 105.0 (H) 78.0 - 100.0 fL   MCH 31.8 26.0 - 34.0 pg   MCHC 30.3 30.0 - 36.0 g/dL   RDW 15.2 11.5 - 15.5 %   Platelets 203 150 - 400 K/uL    Comment: Performed at [facility], [address] [place], [place] [address]  Glucose, capillary     Status: Abnormal   Collection Time: 08/18/17  4:56 AM  Result Value Ref Range   Glucose-Capillary 174 (H) 65 - 99 mg/dL  Blood gas, arterial     Status: Abnormal (Preliminary result)   Collection Time: 08/18/17  5:20 AM  Result Value Ref Range   FIO2 40.00    Delivery systems VENTILATOR    Mode PRESSURE REGULATED VOLUME CONTROL    VT 520 mL   LHR 12 resp/min   Peep/cpap 5.0 cm H20   pH, Arterial 7.473 (H) 7.350 - 7.450   pCO2 arterial 47.9 32.0 - 48.0 mmHg   pO2, Arterial 73.6 (L) 83.0 - 108.0 mmHg   Bicarbonate 33.8 (H) 20.0 - 28.0 mmol/L   Acid-Base Excess 10.5 (H) 0.0 - 2.0 mmol/L   O2 Saturation 94.5 %   Collection site LEFT RADIAL    Drawn by PASS    Sample type ARTERIAL     Comment: Performed at [facility], [address], [place], [place] [address]   Allens test (pass/fail) PENDING PASS    ABGS Recent Labs    08/18/17 0520  PHART 7.473*  PO2ART 73.6*  HCO3 33.8*   CULTURES Recent Results (from the past 240 hour(s))  MRSA PCR Screening     Status: None   Collection Time: 08/10/17  8:35 AM  Result Value Ref Range Status   MRSA by PCR NEGATIVE NEGATIVE Final    Comment:        The GeneXpert MRSA Assay (FDA approved for NASAL specimens only), is one component of a comprehensive MRSA colonization surveillance program. It is not intended to diagnose MRSA infection nor to guide or monitor treatment for MRSA infections. Performed at [facility], [address], [place], [place] [address]     Studies/Results: Dg Chest Port 1 View  Result Date: 08/17/2017 CLINICAL DATA:  Intubation. EXAM: PORTABLE CHEST 1 VIEW COMPARISON:  08/16/2017. FINDINGS: Endotracheal tube and NG tube in stable position. Cardiomegaly with bilateral interstitial prominence consistent pulmonary edema again noted. No interim change. Small left pleural scratched it small pleural effusions can't be excluded. No pneumothorax. IMPRESSION: 1.  Endotracheal tube and NG tube in stable position. 2. Findings consistent congestive heart failure bilateral interstitial edema small pleural effusions. No interim change from prior exam. Electronically Signed   By: Marcello Moores  Register   On: 08/17/2017 05:49   Dg Chest Port 1v Same Day  Result Date: 08/17/2017 CLINICAL DATA:  Check gastric catheter placement EXAM: PORTABLE CHEST 1 VIEW COMPARISON:  08/17/2017 FINDINGS: Cardiac  shadow is enlarged but stable. Endotracheal tube is noted in satisfactory position. Nasogastric catheter is noted tip within the stomach. Lungs are well aerated with vascular congestion and interstitial edema similar that seen prior exam. IMPRESSION: The overall appearance of the chest is stable from the prior exam. Electronically Signed   By: Inez Catalina M.D.   On: 08/17/2017 21:24    Medications:  Prior to Admission:  Medications Prior to Admission  Medication Sig Dispense Refill Last Dose  . acetaminophen (TYLENOL) 500 MG tablet Take 500 mg by mouth every 6 (six) hours as needed for moderate pain or headache.   08/05/2017 at 0630  . albuterol (PROVENTIL HFA;VENTOLIN HFA) 108 (90 Base) MCG/ACT inhaler Inhale 2 puffs into the lungs every 4 (four) hours as needed for wheezing or shortness of breath. 1 Inhaler 0 08/04/2017 at Unknown time  . amiodarone (PACERONE) 200 MG tablet Take 1 tablet (200 mg total) by mouth every morning. 90 tablet 3 08/05/2017 at Unknown time  . amoxicillin (AMOXIL) 500 MG capsule Take 1 capsule by mouth 3 (three) times daily.  0 08/05/2017 at  0700  . apixaban (ELIQUIS) 5 MG TABS tablet Take 1 tablet (5 mg total) by mouth 2 (two) times daily. 60 tablet 0 08/05/2017 at Unknown time  . Cyanocobalamin (B-12) 5000 MCG SUBL Take 1 tablet by mouth daily.  12 08/05/2017 at 0700  . dicyclomine (BENTYL) 10 MG capsule TAKE 1 TO 2 CAPSULES BY MOUTH BEFORE meals AS NEEDED 180 capsule 3 08/04/2017 at Unknown time  . DULoxetine (CYMBALTA) 30 MG capsule Take 30 mg by mouth 2 (two) times daily.   08/05/2017 at 0700  . famotidine (PEPCID) 20 MG tablet Take 20 mg by mouth at bedtime.    08/04/2017 at Unknown time  . furosemide (LASIX) 40 MG tablet TAKE 40 MG DAILY. 90 tablet 3 08/05/2017 at Unknown time  . gabapentin (NEURONTIN) 400 MG capsule Take 400-800 mg by mouth 3 (three) times daily. Take 1 capsule(466m) by mouth twice a day and take 2 capsules(8072m by mouth at bedtime  11 08/05/2017 at 0700  . glipiZIDE (GLUCOTROL) 10 MG tablet Take 10 mg by mouth 2 (two) times daily before a meal.    08/05/2017 at Unknown time  . Insulin Glargine (LANTUS SOLOSTAR) 100 UNIT/ML Solostar Pen Inject 20-50 Units into the skin at bedtime as needed. Patient takes 20 units every night at bedtime if over 178.  Takes 50 units if her blood sugar is above 300.   08/04/2017 at 2200  . levETIRAcetam (KEPPRA) 1000 MG tablet Take 1,000 mg by mouth 2 (two) times daily.  1 08/05/2017 at 0700  . levothyroxine (SYNTHROID, LEVOTHROID) 137 MCG tablet Take 137 mcg by mouth daily.   08/05/2017 at 0700  . loperamide (IMODIUM) 2 MG capsule TAKE ONE CAPSULE BY MOUTH TWICE DAILY AS NEEDED 60 capsule 3 08/04/2017 at Unknown time  . LORazepam (ATIVAN) 1 MG tablet Take 1 mg by mouth 3 (three) times daily as needed for anxiety or sleep. For anxiety   08/04/2017 at 2200  . magnesium oxide (MAG-OX) 400 MG tablet Take 400 mg by mouth daily.    08/05/2017 at Unknown time  . metaxalone (SKELAXIN) 800 MG tablet Take 800 mg by mouth 3 (three) times daily.    08/05/2017 at 0700  . metFORMIN (GLUCOPHAGE) 1000 MG tablet Take 1,000  mg by mouth 2 (two) times daily with a meal.    08/05/2017 at 0700  . Multiple Vitamin (MULTIVITAMIN WITH MINERALS)  TABS tablet Take 1 tablet by mouth daily.   08/05/2017 at 0700  . nitroGLYCERIN (NITROSTAT) 0.4 MG SL tablet Place 0.4 mg under the tongue every 5 (five) minutes as needed for chest pain. Reported on 07/08/2015   Taking  . ondansetron (ZOFRAN) 4 MG tablet TAKE ONE TABLET BY MOUTH FOUR TIMES DAILY FOR NAUSEA (Patient taking differently: TAKE ONE TABLET BY MOUTH FOUR TIMES DAILY FOR NAUSEA. Takes as needed) 120 tablet 3 Past Week at Unknown time  . pantoprazole (PROTONIX) 40 MG tablet TAKE ONE TABLET BY MOUTH TWICE DAILY BEFORE APPLY MEAL 180 tablet 3 08/05/2017 at 0700  . potassium chloride (K-DUR,KLOR-CON) 10 MEQ tablet TAKE TWO TABLETS BY MOUTH TWICE DAILY 120 tablet 11 08/05/2017 at 0700  . promethazine (PHENERGAN) 12.5 MG tablet Take 1 tablet (12.5 mg total) by mouth every 4 (four) hours as needed for nausea or vomiting. 42 tablet 0 Taking  . UNABLE TO FIND Take 1 tablet by mouth 2 (two) times daily. Medication Name: [employer] RX Multivitamin Diabetes Support  12 08/05/2017 at 0700   Scheduled: . amiodarone  200 mg Oral q morning - 10a  . apixaban  5 mg Oral BID  . chlorhexidine gluconate (MEDLINE KIT)  15 mL Mouth Rinse BID  . Chlorhexidine Gluconate Cloth  6 each Topical Daily  . dextromethorphan  30 mg Oral BID  . furosemide  40 mg Oral Daily  . gabapentin  400 mg Oral 2 times per day  . gabapentin  800 mg Oral QHS  . insulin aspart  0-20 Units Subcutaneous Q4H  . insulin aspart  8 Units Subcutaneous Q4H  . insulin glargine  55 Units Subcutaneous QHS  . ipratropium-albuterol  3 mL Nebulization Q4H  . ketoconazole   Topical BID  . levETIRAcetam  1,000 mg Oral BID  . levothyroxine  75 mcg Intravenous Daily  . magnesium oxide  400 mg Oral Daily  . mouth rinse  15 mL Mouth Rinse QID  . methylPREDNISolone (SOLU-MEDROL) injection  40 mg Intravenous Daily  . multivitamin  15 mL Oral  Daily  . nystatin   Topical TID  . pantoprazole (PROTONIX) IV  40 mg Intravenous Q24H  . potassium chloride  20 mEq Oral Daily  . sodium chloride flush  10-40 mL Intracatheter Q12H   Continuous: . feeding supplement (VITAL HIGH PROTEIN) 1,000 mL (08/18/17 0450)  . fentaNYL infusion INTRAVENOUS 200 mcg/hr (08/18/17 0645)  . midazolam (VERSED) infusion 6 mg/hr (08/18/17 0328)   QIW:LNLGXQJJHERDE (TYLENOL) oral liquid 160 mg/5 mL, albuterol, fentaNYL, fentaNYL (SUBLIMAZE) injection, loperamide, midazolam, nitroGLYCERIN, ondansetron (ZOFRAN) IV, sodium chloride flush  Assesment: She was admitted with community-acquired pneumonia that had failed outpatient treatment.  She was started on antibiotics and was on BiPAP which she did not tolerate well.  She eventually was placed on mechanical ventilation 9 days ago because of near "white out" of her left lung trouble maintaining her oxygenation and not tolerating BiPAP.  Her pneumonia seems to be improving.  She still has diffuse bilateral infiltrates on chest x-ray and has diastolic dysfunction known.  She has had relatively low CVP her BNP is only mildly elevated but we have had trouble getting her off the ventilator.  Discussed with Dr. Carles Collet and he agrees we should try to diurese her and see if that helps [place] get her off the ventilator.  She has some cognitive dysfunction which makes things more difficult and she gets very agitated  She has COPD exacerbation which is being treated with steroids  and bronchodilators  She has sleep apnea which may complicate getting her off the ventilator Principal Problem:   CAP Failed Outpatient Tx Active Problems:   Hypothyroidism   Hyperlipidemia   Essential hypertension   Atrial fibrillation (HCC)   Congestive heart failure (HCC)   Gastroparesis   Sleep apnea   Chronic anticoagulation   Tremor   Dementia   DM type 2 (diabetes mellitus, type 2) (HCC)   Chronic diastolic CHF (congestive heart failure)  (HCC)   Pulmonary hypertension (HCC)   COPD exacerbation (HCC)   Acute respiratory failure with hypercapnia (HCC)   Acute respiratory failure with hypoxia and hypercapnia (HCC)   Uncontrolled type 2 diabetes mellitus with hyperglycemia (HCC)   Acute on chronic diastolic CHF (congestive heart failure) ([place])    Plan: Continue treatments.  See if diuresis helps.  Attempt weaning today    LOS: 13 days   Maelle Sheaffer L 08/18/2017, 7:05 AM

## 2017-08-19 ENCOUNTER — Inpatient Hospital Stay (HOSPITAL_COMMUNITY): Payer: Medicare Other

## 2017-08-19 DIAGNOSIS — E038 Other specified hypothyroidism: Secondary | ICD-10-CM

## 2017-08-19 LAB — GLUCOSE, CAPILLARY
GLUCOSE-CAPILLARY: 69 mg/dL (ref 65–99)
GLUCOSE-CAPILLARY: 64 mg/dL — AB (ref 65–99)
GLUCOSE-CAPILLARY: 92 mg/dL (ref 65–99)
GLUCOSE-CAPILLARY: 231 mg/dL — AB (ref 65–99)
GLUCOSE-CAPILLARY: 220 mg/dL — AB (ref 65–99)
Glucose-Capillary: 76 mg/dL (ref 65–99)
Glucose-Capillary: 148 mg/dL — ABNORMAL HIGH (ref 65–99)
Glucose-Capillary: 246 mg/dL — ABNORMAL HIGH (ref 65–99)

## 2017-08-19 LAB — BLOOD GAS, ARTERIAL
Acid-Base Excess: 10.5 mmol/L — ABNORMAL HIGH (ref 0.0–2.0)
Acid-Base Excess: 11 mmol/L — ABNORMAL HIGH (ref 0.0–2.0)
Bicarbonate: 33.8 mmol/L — ABNORMAL HIGH (ref 20.0–28.0)
Bicarbonate: 33.7 mmol/L — ABNORMAL HIGH (ref 20.0–28.0)
Drawn by: 22223
FIO2: 40
FIO2: 40
LHR: 12 {breaths}/min
MECHVT: 520 mL
O2 SAT: 94.5 %
O2 Saturation: 93.7 %
PCO2 ART: 47.9 mmHg (ref 32.0–48.0)
PEEP: 5 cmH2O
PEEP: 5 cmH2O
PO2 ART: 73.6 mmHg — AB (ref 83.0–108.0)
RATE: 12 resp/min
VT: 520 mL
pCO2 arterial: 53.6 mmHg — ABNORMAL HIGH (ref 32.0–48.0)
pH, Arterial: 7.473 — ABNORMAL HIGH (ref 7.350–7.450)
pH, Arterial: 7.439 (ref 7.350–7.450)
pO2, Arterial: 72.2 mmHg — ABNORMAL LOW (ref 83.0–108.0)

## 2017-08-19 LAB — BRAIN NATRIURETIC PEPTIDE: B NATRIURETIC PEPTIDE 5: 316 pg/mL — AB (ref 0.0–100.0)

## 2017-08-19 LAB — BASIC METABOLIC PANEL
ANION GAP: 9 (ref 5–15)
BUN: 42 mg/dL — ABNORMAL HIGH (ref 6–20)
CALCIUM: 8.4 mg/dL — AB (ref 8.9–10.3)
CHLORIDE: 105 mmol/L (ref 101–111)
CO2: 32 mmol/L (ref 22–32)
Creatinine, Ser: 0.77 mg/dL (ref 0.44–1.00)
GFR calc non Af Amer: >60 mL/min (ref >60)
Glucose, Bld: 108 mg/dL — ABNORMAL HIGH (ref 65–99)
Potassium: 3.4 mmol/L — ABNORMAL LOW (ref 3.5–5.1)
Sodium: 146 mmol/L — ABNORMAL HIGH (ref 135–145)

## 2017-08-19 LAB — URINE CULTURE: Culture: 50000 — AB

## 2017-08-19 MED ORDER — DEXTROSE 50 % IV SOLN
25.0000 mL | Freq: Once | INTRAVENOUS | Status: AC
  Administered 2017-08-19: 25 mL via INTRAVENOUS

## 2017-08-19 MED ORDER — IPRATROPIUM-ALBUTEROL 0.5-2.5 (3) MG/3ML IN SOLN: 3.0000 mL | Freq: Three times a day (TID) | RESPIRATORY_TRACT | Status: DC

## 2017-08-19 MED ORDER — FENTANYL CITRATE (PF) 2500 MCG/50ML IJ SOLN
INTRAMUSCULAR | Status: AC
  Filled 2017-08-19: qty 50

## 2017-08-19 MED ORDER — DEXTROSE 50 % IV SOLN
INTRAVENOUS | Status: AC
  Filled 2017-08-19: qty 50

## 2017-08-19 MED ORDER — IPRATROPIUM-ALBUTEROL 0.5-2.5 (3) MG/3ML IN SOLN
3.0000 mL | Freq: Four times a day (QID) | RESPIRATORY_TRACT | Status: DC
  Administered 2017-08-19 – 2017-08-22 (×15): 3 mL via RESPIRATORY_TRACT
  Filled 2017-08-19 (×15): qty 3

## 2017-08-19 MED ORDER — MIDAZOLAM 50MG/50ML (1MG/ML) PREMIX INFUSION
INTRAVENOUS | Status: AC
  Filled 2017-08-19: qty 50

## 2017-08-19 MED ORDER — POTASSIUM CHLORIDE 20 MEQ/15ML (10%) PO SOLN
40.0000 meq | Freq: Once | ORAL | Status: AC
  Administered 2017-08-19: 40 meq
  Filled 2017-08-19: qty 30

## 2017-08-19 MED ORDER — POTASSIUM CHLORIDE 20 MEQ/15ML (10%) PO SOLN
40.0000 meq | Freq: Every day | ORAL | Status: DC
  Administered 2017-08-20 – 2017-08-22 (×3): 40 meq via ORAL
  Filled 2017-08-19 (×3): qty 30

## 2017-08-19 NOTE — Progress Notes (Addendum)
This RN notified by Respiratory Therapist that pt's ventilator settings had been changed to CPAP/PS. This RN did not change ventilator settings nor did respiratory therapist.  This RN did have to tell patient's daughter to not touch ventilator at beginning of shift due to pt's daughter pressing the silence button at beginning of shift. Family member educated to not touch any of the monitors or alarms in the room. Daughter very anxious.

## 2017-08-19 NOTE — Progress Notes (Signed)
Pt very restless and agitated upon initial assessment. RASS of 2. RN had to increase Fentanyl sedation due to pt's rolling in bed and reaching for ET tube. Pt is now resting well. Goal RASS of -2. RN decreasing fentanyl drip.  Genelle Bal, RN

## 2017-08-19 NOTE — Progress Notes (Signed)
Subjective: No new problems noted.  She continues to have agitation.  She is being diuresed to see if that will help [place] get her off the ventilator.  Objective: Vital signs in last 24 hours: Temp:  [97.8 F (36.6 C)-98.9 F (37.2 C)] 98.1 F (36.7 C) (02/16 0801) Pulse Rate:  [37-75] 48 (02/16 0801) Resp:  [12-24] 19 (02/16 0801) BP: (84-135)/(40-89) 106/51 (02/16 0600) SpO2:  [91 %-100 %] 93 % (02/16 0900) FiO2 (%):  [40 %-45 %] 40 % (02/16 0900) Weight:  [105.5 kg (232 lb 9.4 oz)] 105.5 kg (232 lb 9.4 oz) (02/16 0400) Weight change: -0.5 kg (-1.6 oz) Last BM Date: 08/18/17  Intake/Output from previous day: 02/15 0701 - 02/16 0700 In: 1720.4 [I.V.:706.4; NG/GT:1013.9] Out: 2825 [Urine:2425; Stool:400]  PHYSICAL EXAM General appearance: Intubated sedated on mechanical ventilation Resp: rales bilaterally and rhonchi bilaterally Cardio: Regular bradycardia GI: soft, non-tender; bowel sounds normal; no masses,  no organomegaly Extremities: extremities normal, atraumatic, no cyanosis or edema Skin warm and dry  Lab Results:  Results for orders placed or performed during the hospital encounter of 08/05/17 (from the past 48 hour(s))  Glucose, capillary     Status: Abnormal   Collection Time: 08/17/17 12:00 PM  Result Value Ref Range   Glucose-Capillary 272 (H) 65 - 99 mg/dL  Urinalysis, Complete w Microscopic     Status: Abnormal   Collection Time: 08/17/17  4:00 PM  Result Value Ref Range   Color, Urine AMBER (A) YELLOW    Comment: BIOCHEMICALS MAY BE AFFECTED BY COLOR   APPearance CLOUDY (A) CLEAR   Specific Gravity, Urine 1.020 1.005 - 1.030   pH 6.0 5.0 - 8.0   Glucose, UA NEGATIVE NEGATIVE mg/dL   Hgb urine dipstick LARGE (A) NEGATIVE   Bilirubin Urine NEGATIVE NEGATIVE   Ketones, ur NEGATIVE NEGATIVE mg/dL   Protein, ur 100 (A) NEGATIVE mg/dL   Nitrite NEGATIVE NEGATIVE   Leukocytes, UA MODERATE (A) NEGATIVE   RBC / HPF TOO NUMEROUS TO COUNT 0 - 5 RBC/hpf   WBC,  UA 6-30 0 - 5 WBC/hpf   Bacteria, UA NONE SEEN NONE SEEN   Squamous Epithelial / LPF NONE SEEN NONE SEEN    Comment: Performed at [facility], [address], [place], [place] [address]  Glucose, capillary     Status: Abnormal   Collection Time: 08/17/17  5:21 PM  Result Value Ref Range   Glucose-Capillary 180 (H) 65 - 99 mg/dL  Glucose, capillary     Status: Abnormal   Collection Time: 08/17/17  7:59 PM  Result Value Ref Range   Glucose-Capillary 164 (H) 65 - 99 mg/dL  Glucose, capillary     Status: Abnormal   Collection Time: 08/17/17 11:54 PM  Result Value Ref Range   Glucose-Capillary 60 (L) 65 - 99 mg/dL  Glucose, capillary     Status: Abnormal   Collection Time: 08/18/17  1:24 AM  Result Value Ref Range   Glucose-Capillary 125 (H) 65 - 99 mg/dL  Basic metabolic panel     Status: Abnormal   Collection Time: 08/18/17  4:41 AM  Result Value Ref Range   Sodium 146 (H) 135 - 145 mmol/L   Potassium 3.1 (L) 3.5 - 5.1 mmol/L    Comment: DELTA CHECK NOTED   Chloride 103 101 - 111 mmol/L   CO2 34 (H) 22 - 32 mmol/L   Glucose, Bld 175 (H) 65 - 99 mg/dL   BUN 44 (H) 6 - 20 mg/dL  Creatinine, Ser 0.79 0.44 - 1.00 mg/dL   Calcium 8.6 (L) 8.9 - 10.3 mg/dL   GFR calc non Af Amer >60 >60 mL/min   GFR calc Af Amer >60 >60 mL/min    Comment: (NOTE) The eGFR has been calculated using the CKD EPI equation. This calculation has not been validated in all clinical situations. eGFR's persistently <60 mL/min signify possible Chronic Kidney Disease.    Anion gap 9 5 - 15    Comment: Performed at [facility], [address], [place], [place] [address]  CBC     Status: Abnormal   Collection Time: 08/18/17  4:41 AM  Result Value Ref Range   WBC 12.1 (H) 4.0 - 10.5 K/uL   RBC 3.02 (L) 3.87 - 5.11 MIL/uL   Hemoglobin 9.6 (L) 12.0 - 15.0 g/dL   HCT 31.7 (L) 36.0 - 46.0 %   MCV 105.0 (H) 78.0 - 100.0 fL   MCH 31.8 26.0 - 34.0 pg   MCHC 30.3 30.0 - 36.0 g/dL   RDW 15.2 11.5 - 15.5 %    Platelets 203 150 - 400 K/uL    Comment: Performed at [facility], [address], [place], [place] [address]  Iron and TIBC     Status: Abnormal   Collection Time: 08/18/17  4:41 AM  Result Value Ref Range   Iron 24 (L) 28 - 170 ug/dL   TIBC 221 (L) 250 - 450 ug/dL   Saturation Ratios 11 10.4 - 31.8 %   UIBC 197 ug/dL    Comment: Performed at [facility], [place] [address], [place], [place] [address]  Ferritin     Status: None   Collection Time: 08/18/17  4:41 AM  Result Value Ref Range   Ferritin 93 11 - 307 ng/mL    Comment: Performed at [facility], [place] [address] [place], [place] [address]  Glucose, capillary     Status: Abnormal   Collection Time: 08/18/17  4:56 AM  Result Value Ref Range   Glucose-Capillary 174 (H) 65 - 99 mg/dL  Blood gas, arterial     Status: Abnormal (Preliminary result)   Collection Time: 08/18/17  5:20 AM  Result Value Ref Range   FIO2 40.00    Delivery systems VENTILATOR    Mode PRESSURE REGULATED VOLUME CONTROL    VT 520 mL   LHR 12 resp/min   Peep/cpap 5.0 cm H20   pH, Arterial 7.473 (H) 7.350 - 7.450   pCO2 arterial 47.9 32.0 - 48.0 mmHg   pO2, Arterial 73.6 (L) 83.0 - 108.0 mmHg   Bicarbonate 33.8 (H) 20.0 - 28.0 mmol/L   Acid-Base Excess 10.5 (H) 0.0 - 2.0 mmol/L   O2 Saturation 94.5 %   Collection site LEFT RADIAL    Drawn by PASS    Sample type ARTERIAL     Comment: Performed at [facility], [address] [place], [place] [address]   Allens test (pass/fail) PENDING PASS  Triglycerides     Status: None   Collection Time: 08/18/17  8:27 AM  Result Value Ref Range   Triglycerides 90 <150 mg/dL    Comment: Performed at [facility], [address], [place], [place] [address]  Glucose, capillary     Status: Abnormal   Collection Time: 08/18/17  8:36 AM  Result Value Ref Range   Glucose-Capillary 187 (H) 65 - 99 mg/dL   Comment 1 Notify RN    Comment 2 Document in Chart   Blood  gas, arterial     Status: Abnormal    Collection Time: 08/18/17 11:30 AM  Result Value Ref Range   FIO2 40.00    Delivery systems VENTILATOR    Peep/cpap 5.0 cm H20   Pressure support 5 cm H20   pH, Arterial 7.337 (L) 7.350 - 7.450   pCO2 arterial 70.0 (HH) 32.0 - 48.0 mmHg    Comment: CRITICAL RESULT CALLED TO, READ BACK BY AND VERIFIED WITH: HYLTON,L.RN AT 1140 08/18/17 BY BROADNAX,L.RRT    pO2, Arterial 55.0 (L) 83.0 - 108.0 mmHg   Bicarbonate 31.8 (H) 20.0 - 28.0 mmol/L   Acid-Base Excess 10.5 (H) 0.0 - 2.0 mmol/L   O2 Saturation 82.8 %   Patient temperature 37.0    Collection site RIGHT BRACHIAL    Drawn by [id number]    Sample type ARTERIAL DRAW     Comment: Performed at [facility], [address] [place], [place] [address]  Glucose, capillary     Status: Abnormal   Collection Time: 08/18/17 11:44 AM  Result Value Ref Range   Glucose-Capillary 142 (H) 65 - 99 mg/dL   Comment 1 Notify RN    Comment 2 Document in Chart   Glucose, capillary     Status: Abnormal   Collection Time: 08/18/17  4:11 PM  Result Value Ref Range   Glucose-Capillary 165 (H) 65 - 99 mg/dL   Comment 1 Notify RN    Comment 2 Document in Chart   Glucose, capillary     Status: Abnormal   Collection Time: 08/18/17  8:03 PM  Result Value Ref Range   Glucose-Capillary 261 (H) 65 - 99 mg/dL   Comment 1 Notify RN    Comment 2 Document in Chart   Glucose, capillary     Status: Abnormal   Collection Time: 08/18/17 11:41 PM  Result Value Ref Range   Glucose-Capillary 177 (H) 65 - 99 mg/dL   Comment 1 Notify RN    Comment 2 Document in Chart   Blood gas, arterial     Status: Abnormal   Collection Time: 08/19/17  4:10 AM  Result Value Ref Range   FIO2 40.00    Delivery systems VENTILATOR    Mode PRESSURE REGULATED VOLUME CONTROL    VT 520 mL   LHR 12 resp/min   Peep/cpap 5.0 cm H20   pH, Arterial 7.439 7.350 - 7.450   pCO2 arterial 53.6 (H) 32.0 - 48.0 mmHg   pO2, Arterial 72.2 (L) 83.0 - 108.0 mmHg   Bicarbonate 33.7 (H) 20.0 - 28.0  mmol/L   Acid-Base Excess 11.0 (H) 0.0 - 2.0 mmol/L   O2 Saturation 93.7 %   Collection site RIGHT BRACHIAL    Drawn by [id number]    Sample type ARTERIAL    Allens test (pass/fail) NOT INDICATED (A) PASS    Comment: Performed at [facility], [address] [place], [place] [address]  Glucose, capillary     Status: None   Collection Time: 08/19/17  4:18 AM  Result Value Ref Range   Glucose-Capillary 69 65 - 99 mg/dL   Comment 1 Notify RN    Comment 2 Document in Chart   Glucose, capillary     Status: Abnormal   Collection Time: 08/19/17  4:38 AM  Result Value Ref Range   Glucose-Capillary 64 (L) 65 - 99 mg/dL  Basic metabolic panel     Status: Abnormal   Collection Time: 08/19/17  4:46 AM  Result Value Ref Range   Sodium  146 (H) 135 - 145 mmol/L   Potassium 3.4 (L) 3.5 - 5.1 mmol/L   Chloride 105 101 - 111 mmol/L   CO2 32 22 - 32 mmol/L   Glucose, Bld 108 (H) 65 - 99 mg/dL   BUN 42 (H) 6 - 20 mg/dL   Creatinine, Ser 0.77 0.44 - 1.00 mg/dL   Calcium 8.4 (L) 8.9 - 10.3 mg/dL   GFR calc non Af Amer >60 >60 mL/min   GFR calc Af Amer >60 >60 mL/min    Comment: (NOTE) The eGFR has been calculated using the CKD EPI equation. This calculation has not been validated in all clinical situations. eGFR's persistently <60 mL/min signify possible Chronic Kidney Disease.    Anion gap 9 5 - 15    Comment: Performed at [facility], [address], [place], [place] [address]  Brain natriuretic peptide     Status: Abnormal   Collection Time: 08/19/17  4:46 AM  Result Value Ref Range   B Natriuretic Peptide 316.0 (H) 0.0 - 100.0 pg/mL    Comment: Performed at [facility], [address], [place], [place] [address]  Glucose, capillary     Status: None   Collection Time: 08/19/17  5:26 AM  Result Value Ref Range   Glucose-Capillary 92 65 - 99 mg/dL   Comment 1 Notify RN    Comment 2 Document in Chart   Glucose, capillary     Status: None   Collection Time: 08/19/17  8:00 AM  Result Value Ref  Range   Glucose-Capillary 76 65 - 99 mg/dL   Comment 1 Notify RN     ABGS Recent Labs    08/19/17 0410  PHART 7.439  PO2ART 72.2*  HCO3 33.7*   CULTURES Recent Results (from the past 240 hour(s))  MRSA PCR Screening     Status: None   Collection Time: 08/10/17  8:35 AM  Result Value Ref Range Status   MRSA by PCR NEGATIVE NEGATIVE Final    Comment:        The GeneXpert MRSA Assay (FDA approved for NASAL specimens only), is one component of a comprehensive MRSA colonization surveillance program. It is not intended to diagnose MRSA infection nor to guide or monitor treatment for MRSA infections. Performed at [facility], [address], [place], [place] [address]    Studies/Results: Dg Chest 1 View  Result Date: 08/19/2017 CLINICAL DATA:  Respiratory failure EXAM: CHEST 1 VIEW COMPARISON:  08/18/2017 FINDINGS: Endotracheal tube in good position. NG tube remains in place. Left arm PICC tip in the proximal SVC. Cardiac enlargement with improvement in pulmonary vascular congestion. Negative for edema. Improvement in bibasilar atelectasis. IMPRESSION: Improvement in congestive heart failure and bibasilar atelectasis. Electronically Signed   By: Franchot Gallo M.D.   On: 08/19/2017 08:28   Dg Chest Port 1 View  Result Date: 08/18/2017 CLINICAL DATA:  Hypoxia EXAM: PORTABLE CHEST 1 VIEW COMPARISON:  August 17, 2017 FINDINGS: Endotracheal tube tip is 3.5 cm above the carina. Nasogastric tube tip and side port are below the diaphragm in the stomach. No pneumothorax. There are small pleural effusions bilaterally. There is interstitial pulmonary edema. There is atelectatic change in both lung bases. There is cardiomegaly with pulmonary venous hypertension. There is aortic atherosclerosis. No adenopathy. No bone lesions. IMPRESSION: Tube positions as described without pneumothorax. Pulmonary vascular congestion with interstitial edema and small pleural effusions bilaterally. Appearance  radiographically is indicative of congestive heart failure. There is bibasilar atelectasis. Electronically Signed   By:  Lowella Grip III M.D.   On: 08/18/2017 07:08   Dg Chest Port 1v Same Day  Result Date: 08/17/2017 CLINICAL DATA:  Check gastric catheter placement EXAM: PORTABLE CHEST 1 VIEW COMPARISON:  08/17/2017 FINDINGS: Cardiac shadow is enlarged but stable. Endotracheal tube is noted in satisfactory position. Nasogastric catheter is noted tip within the stomach. Lungs are well aerated with vascular congestion and interstitial edema similar that seen prior exam. IMPRESSION: The overall appearance of the chest is stable from the prior exam. Electronically Signed   By: Inez Catalina M.D.   On: 08/17/2017 21:24    Medications:  Prior to Admission:  Medications Prior to Admission  Medication Sig Dispense Refill Last Dose  . acetaminophen (TYLENOL) 500 MG tablet Take 500 mg by mouth every 6 (six) hours as needed for moderate pain or headache.   08/05/2017 at 0630  . albuterol (PROVENTIL HFA;VENTOLIN HFA) 108 (90 Base) MCG/ACT inhaler Inhale 2 puffs into the lungs every 4 (four) hours as needed for wheezing or shortness of breath. 1 Inhaler 0 08/04/2017 at Unknown time  . amiodarone (PACERONE) 200 MG tablet Take 1 tablet (200 mg total) by mouth every morning. 90 tablet 3 08/05/2017 at Unknown time  . amoxicillin (AMOXIL) 500 MG capsule Take 1 capsule by mouth 3 (three) times daily.  0 08/05/2017 at 0700  . apixaban (ELIQUIS) 5 MG TABS tablet Take 1 tablet (5 mg total) by mouth 2 (two) times daily. 60 tablet 0 08/05/2017 at Unknown time  . Cyanocobalamin (B-12) 5000 MCG SUBL Take 1 tablet by mouth daily.  12 08/05/2017 at 0700  . dicyclomine (BENTYL) 10 MG capsule TAKE 1 TO 2 CAPSULES BY MOUTH BEFORE meals AS NEEDED 180 capsule 3 08/04/2017 at Unknown time  . DULoxetine (CYMBALTA) 30 MG capsule Take 30 mg by mouth 2 (two) times daily.   08/05/2017 at 0700  . famotidine (PEPCID) 20 MG tablet Take 20 mg by  mouth at bedtime.    08/04/2017 at Unknown time  . furosemide (LASIX) 40 MG tablet TAKE 40 MG DAILY. 90 tablet 3 08/05/2017 at Unknown time  . gabapentin (NEURONTIN) 400 MG capsule Take 400-800 mg by mouth 3 (three) times daily. Take 1 capsule(452m) by mouth twice a day and take 2 capsules(8010m by mouth at bedtime  11 08/05/2017 at 0700  . glipiZIDE (GLUCOTROL) 10 MG tablet Take 10 mg by mouth 2 (two) times daily before a meal.    08/05/2017 at Unknown time  . Insulin Glargine (LANTUS SOLOSTAR) 100 UNIT/ML Solostar Pen Inject 20-50 Units into the skin at bedtime as needed. Patient takes 20 units every night at bedtime if over 178.  Takes 50 units if her blood sugar is above 300.   08/04/2017 at 2200  . levETIRAcetam (KEPPRA) 1000 MG tablet Take 1,000 mg by mouth 2 (two) times daily.  1 08/05/2017 at 0700  . levothyroxine (SYNTHROID, LEVOTHROID) 137 MCG tablet Take 137 mcg by mouth daily.   08/05/2017 at 0700  . loperamide (IMODIUM) 2 MG capsule TAKE ONE CAPSULE BY MOUTH TWICE DAILY AS NEEDED 60 capsule 3 08/04/2017 at Unknown time  . LORazepam (ATIVAN) 1 MG tablet Take 1 mg by mouth 3 (three) times daily as needed for anxiety or sleep. For anxiety   08/04/2017 at 2200  . magnesium oxide (MAG-OX) 400 MG tablet Take 400 mg by mouth daily.    08/05/2017 at Unknown time  . metaxalone (SKELAXIN) 800 MG tablet Take 800 mg by mouth 3 (three) times daily.  08/05/2017 at 0700  . metFORMIN (GLUCOPHAGE) 1000 MG tablet Take 1,000 mg by mouth 2 (two) times daily with a meal.    08/05/2017 at 0700  . Multiple Vitamin (MULTIVITAMIN WITH MINERALS) TABS tablet Take 1 tablet by mouth daily.   08/05/2017 at 0700  . nitroGLYCERIN (NITROSTAT) 0.4 MG SL tablet Place 0.4 mg under the tongue every 5 (five) minutes as needed for chest pain. Reported on 07/08/2015   Taking  . ondansetron (ZOFRAN) 4 MG tablet TAKE ONE TABLET BY MOUTH FOUR TIMES DAILY FOR NAUSEA (Patient taking differently: TAKE ONE TABLET BY MOUTH FOUR TIMES DAILY FOR NAUSEA. Takes as  needed) 120 tablet 3 Past Week at Unknown time  . pantoprazole (PROTONIX) 40 MG tablet TAKE ONE TABLET BY MOUTH TWICE DAILY BEFORE APPLY MEAL 180 tablet 3 08/05/2017 at 0700  . potassium chloride (K-DUR,KLOR-CON) 10 MEQ tablet TAKE TWO TABLETS BY MOUTH TWICE DAILY 120 tablet 11 08/05/2017 at 0700  . promethazine (PHENERGAN) 12.5 MG tablet Take 1 tablet (12.5 mg total) by mouth every 4 (four) hours as needed for nausea or vomiting. 42 tablet 0 Taking  . UNABLE TO FIND Take 1 tablet by mouth 2 (two) times daily. Medication Name: [employer] RX Multivitamin Diabetes Support  12 08/05/2017 at 0700   Scheduled: . amiodarone  200 mg Oral q morning - 10a  . apixaban  5 mg Oral BID  . chlorhexidine gluconate (MEDLINE KIT)  15 mL Mouth Rinse BID  . Chlorhexidine Gluconate Cloth  6 each Topical Daily  . dextromethorphan  30 mg Oral BID  . furosemide  40 mg Intravenous BID  . gabapentin  400 mg Oral 2 times per day  . gabapentin  800 mg Oral QHS  . insulin aspart  0-20 Units Subcutaneous Q4H  . insulin aspart  8 Units Subcutaneous Q4H  . insulin glargine  55 Units Subcutaneous QHS  . ipratropium-albuterol  3 mL Nebulization Q6H  . ketoconazole   Topical BID  . levETIRAcetam  1,000 mg Oral BID  . levothyroxine  75 mcg Intravenous Daily  . magnesium oxide  400 mg Oral Daily  . mouth rinse  15 mL Mouth Rinse QID  . methylPREDNISolone (SOLU-MEDROL) injection  40 mg Intravenous Daily  . multivitamin  15 mL Oral Daily  . nystatin   Topical TID  . pantoprazole (PROTONIX) IV  40 mg Intravenous Q24H  . potassium chloride  20 mEq Oral Daily  . sodium chloride flush  10-40 mL Intracatheter Q12H   Continuous: . feeding supplement (VITAL HIGH PROTEIN) 1,000 mL (08/19/17 0400)  . fentaNYL infusion INTRAVENOUS 225 mcg/hr (08/19/17 0630)  . midazolam (VERSED) infusion 6 mg/hr (08/19/17 0617)   KCL:EXNTZGYFVCBSW (TYLENOL) oral liquid 160 mg/5 mL, albuterol, fentaNYL, fentaNYL (SUBLIMAZE) injection, loperamide,  midazolam, nitroGLYCERIN, ondansetron (ZOFRAN) IV, sodium chloride flush  Assesment: She came in with community-acquired pneumonia that failed outpatient treatment.  She had acute on chronic hypoxic and hypercapnic respiratory failure and eventually required intubation and mechanical ventilation.  She remains on the ventilator she has been difficult to wean because of agitation and poor volumes.  This morning we attempted to wean her but she almost immediately had worsened bradycardia and her oxygen saturation dropped so weaning attempt was stopped.  She has finished antibiotics  She has diastolic heart failure and we are diuresing her to see if that will help [place] with the weaning process  She has COPD exacerbation which is being treated with steroids and inhaled bronchodilators  She has sleep  apnea at baseline  She has cognitive impairment at baseline and I think that interferes with her ability to wean her Principal Problem:   CAP Failed Outpatient Tx Active Problems:   Hypothyroidism   Hyperlipidemia   Essential hypertension   Atrial fibrillation (HCC)   Congestive heart failure (HCC)   Gastroparesis   Sleep apnea   Chronic anticoagulation   Tremor   Dementia   DM type 2 (diabetes mellitus, type 2) (HCC)   Chronic diastolic CHF (congestive heart failure) (HCC)   Pulmonary hypertension (HCC)   COPD exacerbation (HCC)   Acute respiratory failure with hypercapnia (HCC)   Acute respiratory failure with hypoxia and hypercapnia (HCC)   Uncontrolled type 2 diabetes mellitus with hyperglycemia (HCC)   Acute on chronic diastolic CHF (congestive heart failure) ([place])    Plan: Continue treatments she was intubated 11 days ago so we are approaching her needing to have tracheostomy    LOS: 14 days   Cheskel Silverio L 08/19/2017, 9:19 AM

## 2017-08-19 NOTE — Progress Notes (Addendum)
PROGRESS NOTE  [name] OIN:[id number] DOB: 10-17-1950 DOA: 08/05/2017 PCP: Lemmie Evens, MD  Brief History: 67 y.o.femalewith multiple medical comorbidities detailed below presented to the emergency department with persistent cough and shortness of breath for the past 2 weeks that failed outpatient management with 2 courses of oral amoxicillin. Patient reports that she is on her second round of amoxicillin and continues to feel short of breath. She has nonproductive cough. She has some wheezing. She has no chest pain. The patient has no swelling in the extremities. She has a congestive heart failure that is chronic and she is taking her diuretics regularly. She denies palpitations. She presented to the ED and noted to be tachypneic. She was afebrile. A chest x-ray was obtained that did show a large left retro-cardiac opacity that was suspicious for pneumonia. EKG was unremarkable except for notation of prolonged PR interval. Her troponin was 0.07. She had a mildly elevated BNP at 189. The patient's blood sugar was elevated at 236. She was given treatment in the emergency department but remained tachypneic.   She was admitted to the hospitaland initially required BiPAP therapy, but then decompensated and required intubation. She has been slow to progress. Weaning trials have been complicated by development of agitation.  The patient was noted to have acute on chronic diastolic CHF.  She was started on intravenous furosemide with good clinical effect.  Weaning trials have been difficult in part due to agitation. Pulmonology following.    Assessment/Plan: Acute respiratory failure with hypoxia and hypercapnia -Multifactorial with the main driving factor related to pneumonia -The patient also has COPD and diastolic CHF with likely undiagnosed OSA/OHS -Appreciate pulmonary follow-up -Continue weaning from mechanical ventilation--pt failed wean  today -Continue Solu-Medrol--decrease to once daily -2/16--personally reviewed CXR--interstitial prominence  Lobar Pneumonia -check PCT<0.10 -d/ced abx after2/13doses--completed12 days  Acute onChronic diastolic CHF -Continue IV lasix -maintain neg fluid balance -2/13 BNP--324.0 -12/26/2015 Echo--normal EF, grade 1 DD, mild MR, PAS P 44 -2/13--Personally reviewed chest x-ray--increased interstitial markings -Daily weights--NEG 6 lbs -repeat Echo--EF 55-60%, grade 2 DD, PAS P 57, mild TR  Paroxysmal atrial fibrillation -Continue amiodarone -Presently in sinus rhythm -Continue apixaban  Essential hypertension -Stable presently  COPD -No wheezing presently  Diabetes mellitus type 2, uncontrolled with hyperglycemia -Check hemoglobin A1c--pending -Continue Lantus 55 units daily -Continue NovoLog sliding scale -Disontinue NovoLog 8 unitsq 4hr due to hypoglycemia -will not change insulin as CBGs improving with decreased steroids  Hypothyroidism -Continue Synthroid   Seizure disorder -Continue Keppra  Hypokalemia -repletes -check mag  Goals of Care -daughter tearful when discussing possible trach/peg -consult palliative medicine       Disposition Plan:Remain in ICU Family Communication:Daughter updateedat bedside2/16--Total time spent 92mnutes. Greater than 50% spent face to face counseling and coordinating care.    Consultants:pulmonary  Code Status: FULL   DVT Prophylaxis:apixaban   Procedures: As Listed in Progress Note Above  Antibiotics: Cefepime 2/5>>>2/13 vanc 2/5>>>2/8  The patient is critically ill with multiple organ systems failure and requires high complexity decision making for assessment and support, frequent evaluation and titration of therapies, application of advanced monitoring technologies and extensive interpretation of multiple databases.  Critical care time - 35 mins.        Subjective: The patient continues to have some difficulty with agitation requiring increase in the fentanyl drip.  There is no vomiting.  No uncontrolled pain.  No diarrhea noted.  Objective: Vitals:   08/19/17 0801 08/19/17  0900 08/19/17 1129 08/19/17 1217  BP:      Pulse: (!) 48     Resp: 19     Temp: 98.1 F (36.7 C)  98 F (36.7 C)   TempSrc: Axillary  Axillary   SpO2: 96% 93%  96%  Weight:      Height:        Intake/Output Summary (Last 24 hours) at 08/19/2017 1547 Last data filed at 08/19/2017 1333 Gross per 24 hour  Intake 1740.35 ml  Output 4675 ml  Net -2934.65 ml   Weight change: -0.5 kg (-1.6 oz) Exam:   General:  Pt is sedated on the ventilator.  HEENT: No icterus, No thrush, No neck mass, [place]/AT  Cardiovascular: RRR, S1/S2, no rubs, no gallops  Respiratory: Bibasilar crackles.  No wheezing.  Good air movement.  Abdomen: Soft/+BS, non tender, non distended, no guarding  Extremities: No edema, No lymphangitis, No petechiae, No rashes, no synovitis   Data Reviewed: I have personally reviewed following labs and imaging studies Basic Metabolic Panel: Recent Labs  Lab 08/15/17 0431 08/16/17 0529 08/17/17 0457 08/18/17 0441 08/19/17 0446  NA 143 143 143 146* 146*  K 4.3 4.0 3.9 3.1* 3.4*  CL 105 106 102 103 105  CO2 29 29 32 34* 32  GLUCOSE 286* 212* 301* 175* 108*  BUN 43* 49* 47* 44* 42*  CREATININE 0.84 0.84 0.89 0.79 0.77  CALCIUM 8.8* 8.9 8.8* 8.6* 8.4*  MG  --   --  2.2  --   --   PHOS  --   --  3.9  --   --    Liver Function Tests: Recent Labs  Lab 08/13/17 0437  AST 21  ALT 19  ALKPHOS 40  BILITOT 0.7  PROT 6.1*  ALBUMIN 2.4*   No results for input(s): LIPASE, AMYLASE in the last 168 hours. No results for input(s): AMMONIA in the last 168 hours. Coagulation Profile: No results for input(s): INR, PROTIME in the last 168 hours. CBC: Recent Labs  Lab 08/14/17 0434 08/15/17 0431 08/16/17 0529 08/17/17 0457  08/18/17 0441  WBC 12.3* 14.5* 14.9* 12.0* 12.1*  NEUTROABS  --   --   --  10.3*  --   HGB 10.0* 10.3* 10.3* 9.9* 9.6*  HCT 33.6* 34.6* 34.2* 33.2* 31.7*  MCV 104.7* 105.5* 105.2* 105.1* 105.0*  PLT 163 198 216 213 203   Cardiac Enzymes: No results for input(s): CKTOTAL, CKMB, CKMBINDEX, TROPONINI in the last 168 hours. BNP: Invalid input(s): POCBNP CBG: Recent Labs  Lab 08/19/17 0418 08/19/17 0438 08/19/17 0526 08/19/17 0800 08/19/17 1128  GLUCAP 69 64* 92 76 148*   HbA1C: No results for input(s): HGBA1C in the last 72 hours. Urine analysis:    Component Value Date/Time   COLORURINE AMBER (A) 08/17/2017 1600   APPEARANCEUR CLOUDY (A) 08/17/2017 1600   LABSPEC 1.020 08/17/2017 1600   PHURINE 6.0 08/17/2017 1600   GLUCOSEU NEGATIVE 08/17/2017 1600   HGBUR LARGE (A) 08/17/2017 1600   BILIRUBINUR NEGATIVE 08/17/2017 1600   KETONESUR NEGATIVE 08/17/2017 1600   PROTEINUR 100 (A) 08/17/2017 1600   UROBILINOGEN 0.2 02/05/2015 1700   NITRITE NEGATIVE 08/17/2017 1600   LEUKOCYTESUR MODERATE (A) 08/17/2017 1600   Sepsis Labs: '@LABRCNTIP' (procalcitonin:4,lacticidven:4) ) Recent Results (from the past 240 hour(s))  MRSA PCR Screening     Status: None   Collection Time: 08/10/17  8:35 AM  Result Value Ref Range Status   MRSA by PCR NEGATIVE NEGATIVE Final    Comment:  The GeneXpert MRSA Assay (FDA approved for NASAL specimens only), is one component of a comprehensive MRSA colonization surveillance program. It is not intended to diagnose MRSA infection nor to guide or monitor treatment for MRSA infections. Performed at [facility], [address] [place], [place] [address]   Culture, Urine     Status: Abnormal   Collection Time: 08/17/17  4:00 PM  Result Value Ref Range Status   Specimen Description   Final    URINE, RANDOM Performed at [facility], [address] [place], [place] [address]    Special Requests   Final    NONE Performed at [facility], [address], [place], [place] [address]    Culture 50,000 COLONIES/mL YEAST (A)  Final   Report Status 08/19/2017 FINAL  Final     Scheduled Meds: . amiodarone  200 mg Oral q morning - 10a  . apixaban  5 mg Oral BID  . chlorhexidine gluconate (MEDLINE KIT)  15 mL Mouth Rinse BID  . Chlorhexidine Gluconate Cloth  6 each Topical Daily  . dextromethorphan  30 mg Oral BID  . furosemide  40 mg Intravenous BID  . gabapentin  400 mg Oral 2 times per day  . gabapentin  800 mg Oral QHS  . insulin aspart  0-20 Units Subcutaneous Q4H  . insulin glargine  55 Units Subcutaneous QHS  . ipratropium-albuterol  3 mL Nebulization Q6H  . ketoconazole   Topical BID  . levETIRAcetam  1,000 mg Oral BID  . levothyroxine  75 mcg Intravenous Daily  . magnesium oxide  400 mg Oral Daily  . mouth rinse  15 mL Mouth Rinse QID  . methylPREDNISolone (SOLU-MEDROL) injection  40 mg Intravenous Daily  . multivitamin  15 mL Oral Daily  . nystatin   Topical TID  . pantoprazole (PROTONIX) IV  40 mg Intravenous Q24H  . potassium chloride  40 mEq Per Tube Once  . [START ON 08/20/2017] potassium chloride  40 mEq Oral Daily  . sodium chloride flush  10-40 mL Intracatheter Q12H   Continuous Infusions: . feeding supplement (VITAL HIGH PROTEIN) 1,000 mL (08/19/17 0400)  . fentaNYL infusion INTRAVENOUS 275 mcg/hr (08/19/17 1217)  . midazolam (VERSED) infusion 6 mg/hr (08/19/17 0617)    Procedures/Studies: Dg Chest 1 View  Result Date: 08/19/2017 CLINICAL DATA:  Respiratory failure EXAM: CHEST 1 VIEW COMPARISON:  08/18/2017 FINDINGS: Endotracheal tube in good position. NG tube remains in place. Left arm PICC tip in the proximal SVC. Cardiac enlargement with improvement in pulmonary vascular congestion. Negative for edema. Improvement in bibasilar atelectasis. IMPRESSION: Improvement in congestive heart failure and bibasilar atelectasis. Electronically Signed   By: Franchot Gallo M.D.   On: 08/19/2017 08:28   Dg  Chest 2 View  Result Date: 08/05/2017 CLINICAL DATA:  Shortness of breath.  Cough. EXAM: CHEST  2 VIEW COMPARISON:  September 30, 2016 FINDINGS: No pneumothorax. Stable cardiomegaly. The hila and mediastinum are unchanged. There appears to be increased opacity in the left retrocardiac region which could represent pneumonia given history. Recommend follow-up to resolution. IMPRESSION: Left retrocardiac opacity may represent pneumonia given history. Recommend follow-up to resolution. Electronically Signed   By: Dorise Bullion III M.D   On: 08/05/2017 09:43   Dg Chest Port 1 View  Result Date: 08/18/2017 CLINICAL DATA:  Hypoxia EXAM: PORTABLE CHEST 1 VIEW COMPARISON:  August 17, 2017 FINDINGS: Endotracheal tube tip is 3.5 cm above the carina. Nasogastric tube tip and side port are below the diaphragm  in the stomach. No pneumothorax. There are small pleural effusions bilaterally. There is interstitial pulmonary edema. There is atelectatic change in both lung bases. There is cardiomegaly with pulmonary venous hypertension. There is aortic atherosclerosis. No adenopathy. No bone lesions. IMPRESSION: Tube positions as described without pneumothorax. Pulmonary vascular congestion with interstitial edema and small pleural effusions bilaterally. Appearance radiographically is indicative of congestive heart failure. There is bibasilar atelectasis. Electronically Signed   By: Lowella Grip III M.D.   On: 08/18/2017 07:08   Dg Chest Port 1 View  Result Date: 08/17/2017 CLINICAL DATA:  Intubation. EXAM: PORTABLE CHEST 1 VIEW COMPARISON:  08/16/2017. FINDINGS: Endotracheal tube and NG tube in stable position. Cardiomegaly with bilateral interstitial prominence consistent pulmonary edema again noted. No interim change. Small left pleural scratched it small pleural effusions can't be excluded. No pneumothorax. IMPRESSION: 1.  Endotracheal tube and NG tube in stable position. 2. Findings consistent congestive heart  failure bilateral interstitial edema small pleural effusions. No interim change from prior exam. Electronically Signed   By: Marcello Moores  Register   On: 08/17/2017 05:49   Dg Chest Port 1 View  Result Date: 08/16/2017 CLINICAL DATA:  Respiratory failure. EXAM: PORTABLE CHEST 1 VIEW COMPARISON:  08/15/2017 FINDINGS: The patient is rotated to the right. Endotracheal tube terminates 4.2 cm above the carina. Enteric tube courses into the upper abdomen with tip not imaged. The cardiac silhouette remains enlarged. Lung volumes remain low with grossly similar appearance of left greater than right basilar opacities suggesting atelectasis. Pulmonary vascular congestion and bilateral interstitial densities have slightly increased. No large pleural effusion or pneumothorax is identified. IMPRESSION: 1. Slightly increased pulmonary edema. 2. Bibasilar atelectasis. Electronically Signed   By: Logan Bores M.D.   On: 08/16/2017 07:48   Dg Chest Port 1 View  Result Date: 08/15/2017 CLINICAL DATA:  Respiratory failure, CHF, COPD EXAM: PORTABLE CHEST 1 VIEW COMPARISON:  Portable chest x-ray of August 14, 2017 FINDINGS: The lungs are mildly hypoinflated. The interstitial markings are coarse though slightly improved. There are densities at both lung bases which may reflect atelectasis. There is no pleural effusion or pneumothorax. The cardiac silhouette is enlarged. The pulmonary vascularity is mildly engorged but less prominent than on the previous study. There calcification in the wall of the aortic arch. The endotracheal tube tip projects 4.8 cm above the carina. The esophagogastric tube tip and proximal port project below the GE junction. IMPRESSION: Bilateral hypoinflation. Slightly decreased pulmonary interstitial edema. Stable cardiomegaly with decreased pulmonary vascular congestion. Probable bibasilar subsegmental atelectasis. The support tubes are in reasonable position. Thoracic aortic atherosclerosis. Electronically  Signed   By: Liboria Putnam  [place] M.D.   On: 08/15/2017 08:40   Dg Chest Port 1 View  Result Date: 08/14/2017 CLINICAL DATA:  Respiratory failure EXAM: PORTABLE CHEST 1 VIEW COMPARISON:  Yesterday FINDINGS: Endotracheal tube tip is between the clavicular heads and carina. An orogastric tube and side-port reaches the stomach. Cardiopericardial enlargement and vascular pedicle widening. There is diffuse haziness of the chest and interstitial opacities. IMPRESSION: 1. Unremarkable positioning of endotracheal and orogastric tubes. 2. CHF pattern.  Pneumonia and atelectasis could be superimposed. Electronically Signed   By: Monte Fantasia M.D.   On: 08/14/2017 06:48   Dg Chest Port 1 View  Result Date: 08/13/2017 CLINICAL DATA:  Respiratory failure EXAM: PORTABLE CHEST 1 VIEW COMPARISON:  08/12/2017 and prior radiographs FINDINGS: Endotracheal tube 2.5 cm above the carina, NG tube entering the stomach with tip off the field of view again noted. Cardiomegaly  and bilateral interstitial/airspace opacities, left-greater-than-right, are not significantly changed. Left lower lung consolidation/atelectasis again noted. There is no evidence of pneumothorax. IMPRESSION: Unchanged appearance of the chest with continued bilateral interstitial/airspace opacities/edema and left lower lung consolidation/atelectasis. Electronically Signed   By: Margarette Canada M.D.   On: 08/13/2017 10:00   Dg Chest Port 1 View  Result Date: 08/12/2017 CLINICAL DATA:  Respiratory failure with shortness of breath EXAM: PORTABLE CHEST 1 VIEW COMPARISON:  Yesterday FINDINGS: Endotracheal tube tip between the clavicular heads and carina. Orogastric tube with tip at the GE junction. Cardiopericardial enlargement. There is diffuse hazy opacity of the chest with probable pleural fluid greater on the left. No visible pneumothorax. Artifact from EKG leads. These results will be called to the ordering clinician or representative by the Radiologist Assistant, and  communication documented in the PACS or zVision Dashboard. IMPRESSION: 1. Orogastric tube tip at the GE junction. Consider ~5 cm advancement into the stomach. 2. CHF pattern similar to yesterday. Electronically Signed   By: Monte Fantasia M.D.   On: 08/12/2017 07:52   Dg Chest Port 1 View  Result Date: 08/11/2017 CLINICAL DATA:  Respiratory failure EXAM: PORTABLE CHEST 1 VIEW COMPARISON:  08/10/2017 FINDINGS: Cardiac shadow remains enlarged. Aortic calcifications are again seen. Endotracheal tube and nasogastric catheter are noted in satisfactory position. Diffuse airspace opacities are again identified bilaterally with some improved aeration in the left upper lobe. No bony abnormality is seen. IMPRESSION: Slight improved aeration particularly on the left. Electronically Signed   By: Inez Catalina M.D.   On: 08/11/2017 07:54   Portable Chest Xray  Result Date: 08/10/2017 CLINICAL DATA:  Endotracheal tube EXAM: PORTABLE CHEST 1 VIEW COMPARISON:  08/09/2017 FINDINGS: Endotracheal tube 3 cm above the carina.  NG tube in the stomach Diffuse bilateral airspace disease asymmetric on the left shows mild interval improvement. No significant effusion. IMPRESSION: Endotracheal tube 3 cm above the carina Severe diffuse bilateral airspace disease with mild interval improvement from yesterday. Electronically Signed   By: Franchot Gallo M.D.   On: 08/10/2017 06:51   Portable Chest X-ray  Result Date: 08/09/2017 CLINICAL DATA:  Endotracheal tube placement. EXAM: PORTABLE CHEST 1 VIEW COMPARISON:  Radiograph of August 08, 2017. FINDINGS: Mild cardiomegaly is noted with central pulmonary vascular congestion. Endotracheal tube is seen projected over tracheal air shadow with distal tip 2 cm above the carina. Nasogastric tube is seen passing through the esophagus into the stomach. Stable bilateral lung opacities are noted, left greater than right, most consistent with pneumonia or possibly edema. No pneumothorax is noted.  Probable left pleural effusion cannot be excluded. Bony thorax is unremarkable. IMPRESSION: Endotracheal and nasogastric tubes are in grossly good position. Stable bilateral lung opacities are noted most consistent with pneumonia or possibly edema. Left pleural effusion cannot be excluded. Electronically Signed   By: Marijo Conception, M.D.   On: 08/09/2017 09:28   Dg Chest Port 1 View  Result Date: 08/08/2017 CLINICAL DATA:  Respiratory failure. EXAM: PORTABLE CHEST 1 VIEW COMPARISON:  August 05, 2017 FINDINGS: There has been progression of airspace consolidation on the left. There is now airspace consolidation throughout most of the left lung. There is a small left pleural effusion. The right lung is clear. There is cardiomegaly with pulmonary venous hypertension. There is aortic atherosclerosis. No adenopathy. No evident bone lesions. IMPRESSION: Consolidation throughout most of the left lung felt to represent widespread pneumonia, progressed from recent study. Right lung clear. Underlying pulmonary vascular congestion. There is aortic  atherosclerosis. Aortic Atherosclerosis (ICD10-I70.0). Electronically Signed   By: Lowella Grip III M.D.   On: 08/08/2017 09:27   Dg Chest Port 1v Same Day  Result Date: 08/17/2017 CLINICAL DATA:  Check gastric catheter placement EXAM: PORTABLE CHEST 1 VIEW COMPARISON:  08/17/2017 FINDINGS: Cardiac shadow is enlarged but stable. Endotracheal tube is noted in satisfactory position. Nasogastric catheter is noted tip within the stomach. Lungs are well aerated with vascular congestion and interstitial edema similar that seen prior exam. IMPRESSION: The overall appearance of the chest is stable from the prior exam. Electronically Signed   By: Inez Catalina M.D.   On: 08/17/2017 21:24   Dg Chest Port 1v Same Day  Result Date: 08/12/2017 CLINICAL DATA:  OG tube replacement. EXAM: PORTABLE CHEST 1 VIEW COMPARISON:  08/12/2017 FINDINGS: An endotracheal tube is identified with 2  cm above the carina. And OG tube enters the stomach with tip off the field of view. Cardiomegaly, pulmonary vascular congestion, right basilar atelectasis and left lower lung consolidation/atelectasis again noted. There is no evidence of pneumothorax. IMPRESSION: Support apparatus as described. Otherwise unchanged appearance of the chest. Electronically Signed   By: Margarette Canada M.D.   On: 08/12/2017 11:40    Orson Eva, DO  [facility] Pager [phone]  If 7PM-7AM, please contact night-coverage [email or web address] Password [email or web address] 08/19/2017, 3:47 PM   LOS: 14 days

## 2017-08-19 NOTE — Progress Notes (Signed)
Pt ventilator found in CPAP/PS mode. Writer did not make change. No new order was given to change ventilator order. Ventilator returned to correct ordered ventilator settings. Sheria Lang, RN and [facility] Tiffany aware. Ventilator screen is now locked. Pt is in no distress. All vital signs are stable. AM ABG is currently ordered

## 2017-08-20 LAB — BLOOD GAS, ARTERIAL
Acid-Base Excess: 11.3 mmol/L — ABNORMAL HIGH (ref 0.0–2.0)
Bicarbonate: 34.5 mmol/L — ABNORMAL HIGH (ref 20.0–28.0)
Drawn by: 105551
FIO2: 40
LHR: 12 {breaths}/min
MECHANICAL RATE: 12
MECHVT: 520 mL
O2 SAT: 92.4 %
PCO2 ART: 50 mmHg — AB (ref 32.0–48.0)
PEEP: 5 cmH2O
PO2 ART: 66.9 mmHg — AB (ref 83.0–108.0)
pH, Arterial: 7.467 — ABNORMAL HIGH (ref 7.350–7.450)

## 2017-08-20 LAB — BASIC METABOLIC PANEL
Anion gap: 9 (ref 5–15)
BUN: 41 mg/dL — ABNORMAL HIGH (ref 6–20)
CALCIUM: 8.4 mg/dL — AB (ref 8.9–10.3)
CO2: 34 mmol/L — AB (ref 22–32)
CREATININE: 0.77 mg/dL (ref 0.44–1.00)
Chloride: 103 mmol/L (ref 101–111)
GFR calc non Af Amer: >60 mL/min (ref >60)
Glucose, Bld: 244 mg/dL — ABNORMAL HIGH (ref 65–99)
Potassium: 3.9 mmol/L (ref 3.5–5.1)
SODIUM: 146 mmol/L — AB (ref 135–145)

## 2017-08-20 LAB — GLUCOSE, CAPILLARY
GLUCOSE-CAPILLARY: 199 mg/dL — AB (ref 65–99)
GLUCOSE-CAPILLARY: 208 mg/dL — AB (ref 65–99)
Glucose-Capillary: 229 mg/dL — ABNORMAL HIGH (ref 65–99)
Glucose-Capillary: 216 mg/dL — ABNORMAL HIGH (ref 65–99)
Glucose-Capillary: 190 mg/dL — ABNORMAL HIGH (ref 65–99)

## 2017-08-20 LAB — MAGNESIUM: Magnesium: 2.1 mg/dL (ref 1.7–2.4)

## 2017-08-20 LAB — PHOSPHORUS: PHOSPHORUS: 3.2 mg/dL (ref 2.5–4.6)

## 2017-08-20 MED ORDER — INSULIN ASPART 100 UNIT/ML ~~LOC~~ SOLN
4.0000 [IU] | SUBCUTANEOUS | Status: DC
Start: 2017-08-20 — End: 2017-08-22
  Administered 2017-08-20 – 2017-08-22 (×10): 4 [IU] via SUBCUTANEOUS

## 2017-08-20 NOTE — Progress Notes (Signed)
Patient is doing well on wean mode and asleep. I'm going to let her continue on till am and check her abgs. Have increased oxygen a little to 50 to cut out her work of breathing also removed some dead space. Will continue to monitor.

## 2017-08-20 NOTE — Progress Notes (Signed)
PROGRESS NOTE  [name] WGY:[id number] DOB: 1951/01/21 DOA: 08/05/2017 PCP: Lemmie Evens, MD  Brief History: 67 y.o.femalewith multiple medical comorbidities detailed below presented to the emergency department with persistent cough and shortness of breath for the past 2 weeks that failed outpatient management with 2 courses of oral amoxicillin. Patient reports that she is on her second round of amoxicillin and continues to feel short of breath. She has nonproductive cough. She has some wheezing. She has no chest pain. The patient has no swelling in the extremities. She has a congestive heart failure that is chronic and she is taking her diuretics regularly. She denies palpitations. She presented to the ED and noted to be tachypneic. She was afebrile. A chest x-ray was obtained that did show a large left retro-cardiac opacity that was suspicious for pneumonia. EKG was unremarkable except for notation of prolonged PR interval. Her troponin was 0.07. She had a mildly elevated BNP at 189. The patient's blood sugar was elevated at 236. She was given treatment in the emergency department but remained tachypneic.   She was admitted to the hospitaland initially required BiPAP therapy, but then decompensated and required intubation. She has been slow to progress. Weaning trials have been complicated by development of agitation.  The patient was noted to have acute on chronic diastolic CHF.  She was started on intravenous furosemide with good clinical effect.  Weaning trials have been difficult in part due to agitation. Pulmonology following.    Assessment/Plan: Acute respiratory failure with hypoxia and hypercapnia -Multifactorial with the main driving factor related to pneumonia -The patient also has COPD and diastolic CHF with likely undiagnosed OSA/OHS -Appreciate pulmonary follow-up -Continue weaning from mechanical ventilation--pt not doing well with  weaning--low tidal volumes and agitation -Continue Solu-Medrol--decrease to once daily -2/16--personally reviewed CXR--interstitial prominence  Lobar Pneumonia -check PCT<0.10 -d/ced abx after2/13doses--completed12 days  Acute onChronic diastolic CHF -Continue IV lasix -maintain neg fluid balance -2/13BNP--324.0 -12/26/2015 Echo--normal EF, grade 1 DD, mild MR, PAS P 44 -2/13--Personally reviewed chest x-ray--increased interstitial markings -Daily weights--NEG 6 lbs -repeat Echo--EF 55-60%, grade 2 DD, PAS P 57, mild TR  Paroxysmal atrial fibrillation -Continue amiodarone -Presently in sinus rhythm -Continue apixaban  Essential hypertension -Stable presently  COPD -No wheezing presently  Diabetes mellitus type 2, uncontrolled with hyperglycemia -Check hemoglobin A1c--9.4 -Continue Lantus 55 units daily -Continue NovoLog sliding scale -restart NovoLog 4 unitsq 4hr -will not change insulinas CBGs improving with decreased steroids  Hypothyroidism -Continue Synthroid   Seizure disorder -Continue Keppra  Hypokalemia -repletes -check mag--2.1  Goals of Care -daughter wishes for full scope of care -consult palliative medicine     Disposition Plan:Remain in ICU Family Communication:Daughter updateedat bedside2/17--Total time spent 110mnutes. Greater than 50% spent face to face counseling and coordinating care.    Consultants:pulmonary  Code Status: FULL   DVT Prophylaxis:apixaban   Procedures: As Listed in Progress Note Above  Antibiotics: Cefepime 2/5>>>2/13 vanc 2/5>>>2/8  The patient is critically ill with multiple organ systems failure and requires high complexity decision making for assessment and support, frequent evaluation and titration of therapies, application of advanced monitoring technologies and extensive interpretation of multiple databases.  Critical care time - 333ms.          Subjective: Patient remains sedated on the ventilator.  She becomes agitated very easily when sedation is decreased.  There is no reports of vomiting, respiratory distress, diarrhea.  Objective: Vitals:   08/20/17 0630 08/20/17 [id number]  08/20/17 0859 08/20/17 0900  BP: (!) 97/43     Pulse: (!) 39 (!) 52    Resp: 13 12    Temp:  97.6 F (36.4 C)    TempSrc:  Axillary    SpO2: 95% 98% 95% 96%  Weight:      Height:        Intake/Output Summary (Last 24 hours) at 08/20/2017 1025 Last data filed at 08/20/2017 0615 Gross per 24 hour  Intake 2245.2 ml  Output 3700 ml  Net -1454.8 ml   Weight change:  Exam:   General:  Pt is sedated on the ventilator  HEENT: No icterus, No thrush, No neck mass, [place]/AT  Cardiovascular: RRR, S1/S2, no rubs, no gallops  Respiratory: Bibasilar crackles.  No wheezing.  Good air movement.  Abdomen: Soft/+BS, non tender, non distended, no guarding  Extremities: No edema, No lymphangitis, No petechiae, No rashes, no synovitis   Data Reviewed: I have personally reviewed following labs and imaging studies Basic Metabolic Panel: Recent Labs  Lab 08/16/17 0529 08/17/17 0457 08/18/17 0441 08/19/17 0446 08/20/17 0429  NA 143 143 146* 146* 146*  K 4.0 3.9 3.1* 3.4* 3.9  CL 106 102 103 105 103  CO2 29 32 34* 32 34*  GLUCOSE 212* 301* 175* 108* 244*  BUN 49* 47* 44* 42* 41*  CREATININE 0.84 0.89 0.79 0.77 0.77  CALCIUM 8.9 8.8* 8.6* 8.4* 8.4*  MG  --  2.2  --   --  2.1  PHOS  --  3.9  --   --  3.2   Liver Function Tests: No results for input(s): AST, ALT, ALKPHOS, BILITOT, PROT, ALBUMIN in the last 168 hours. No results for input(s): LIPASE, AMYLASE in the last 168 hours. No results for input(s): AMMONIA in the last 168 hours. Coagulation Profile: No results for input(s): INR, PROTIME in the last 168 hours. CBC: Recent Labs  Lab 08/14/17 0434 08/15/17 0431 08/16/17 0529 08/17/17 0457 08/18/17 0441  WBC 12.3* 14.5* 14.9*  12.0* 12.1*  NEUTROABS  --   --   --  10.3*  --   HGB 10.0* 10.3* 10.3* 9.9* 9.6*  HCT 33.6* 34.6* 34.2* 33.2* 31.7*  MCV 104.7* 105.5* 105.2* 105.1* 105.0*  PLT 163 198 216 213 203   Cardiac Enzymes: No results for input(s): CKTOTAL, CKMB, CKMBINDEX, TROPONINI in the last 168 hours. BNP: Invalid input(s): POCBNP CBG: Recent Labs  Lab 08/19/17 1644 08/19/17 1936 08/19/17 2329 08/20/17 0412 08/20/17 0810  GLUCAP 231* 246* 220* 229* 216*   HbA1C: No results for input(s): HGBA1C in the last 72 hours. Urine analysis:    Component Value Date/Time   COLORURINE AMBER (A) 08/17/2017 1600   APPEARANCEUR CLOUDY (A) 08/17/2017 1600   LABSPEC 1.020 08/17/2017 1600   PHURINE 6.0 08/17/2017 1600   GLUCOSEU NEGATIVE 08/17/2017 1600   HGBUR LARGE (A) 08/17/2017 1600   BILIRUBINUR NEGATIVE 08/17/2017 1600   KETONESUR NEGATIVE 08/17/2017 1600   PROTEINUR 100 (A) 08/17/2017 1600   UROBILINOGEN 0.2 02/05/2015 1700   NITRITE NEGATIVE 08/17/2017 1600   LEUKOCYTESUR MODERATE (A) 08/17/2017 1600   Sepsis Labs: '@LABRCNTIP' (procalcitonin:4,lacticidven:4) ) Recent Results (from the past 240 hour(s))  Culture, Urine     Status: Abnormal   Collection Time: 08/17/17  4:00 PM  Result Value Ref Range Status   Specimen Description   Final    URINE, RANDOM Performed at [facility], [address], [place], [place] [address]    Special Requests   Final  NONE Performed at [facility], [address] [place], [place] [address]    Culture 50,000 COLONIES/mL YEAST (A)  Final   Report Status 08/19/2017 FINAL  Final     Scheduled Meds: . amiodarone  200 mg Oral q morning - 10a  . apixaban  5 mg Oral BID  . chlorhexidine gluconate (MEDLINE KIT)  15 mL Mouth Rinse BID  . Chlorhexidine Gluconate Cloth  6 each Topical Daily  . dextromethorphan  30 mg Oral BID  . furosemide  40 mg Intravenous BID  . gabapentin  400 mg Oral 2 times per day  . gabapentin  800 mg Oral QHS  . insulin aspart  0-20  Units Subcutaneous Q4H  . insulin glargine  55 Units Subcutaneous QHS  . ipratropium-albuterol  3 mL Nebulization Q6H  . ketoconazole   Topical BID  . levETIRAcetam  1,000 mg Oral BID  . levothyroxine  75 mcg Intravenous Daily  . magnesium oxide  400 mg Oral Daily  . mouth rinse  15 mL Mouth Rinse QID  . methylPREDNISolone (SOLU-MEDROL) injection  40 mg Intravenous Daily  . multivitamin  15 mL Oral Daily  . nystatin   Topical TID  . pantoprazole (PROTONIX) IV  40 mg Intravenous Q24H  . potassium chloride  40 mEq Oral Daily  . sodium chloride flush  10-40 mL Intracatheter Q12H   Continuous Infusions: . feeding supplement (VITAL HIGH PROTEIN) 1,000 mL (08/20/17 0414)  . fentaNYL infusion INTRAVENOUS 300 mcg/hr (08/20/17 0756)  . midazolam (VERSED) infusion 6 mg/hr (08/20/17 0922)    Procedures/Studies: Dg Chest 1 View  Result Date: 08/19/2017 CLINICAL DATA:  Respiratory failure EXAM: CHEST 1 VIEW COMPARISON:  08/18/2017 FINDINGS: Endotracheal tube in good position. NG tube remains in place. Left arm PICC tip in the proximal SVC. Cardiac enlargement with improvement in pulmonary vascular congestion. Negative for edema. Improvement in bibasilar atelectasis. IMPRESSION: Improvement in congestive heart failure and bibasilar atelectasis. Electronically Signed   By: Franchot Gallo M.D.   On: 08/19/2017 08:28   Dg Chest 2 View  Result Date: 08/05/2017 CLINICAL DATA:  Shortness of breath.  Cough. EXAM: CHEST  2 VIEW COMPARISON:  September 30, 2016 FINDINGS: No pneumothorax. Stable cardiomegaly. The hila and mediastinum are unchanged. There appears to be increased opacity in the left retrocardiac region which could represent pneumonia given history. Recommend follow-up to resolution. IMPRESSION: Left retrocardiac opacity may represent pneumonia given history. Recommend follow-up to resolution. Electronically Signed   By: Dorise Bullion III M.D   On: 08/05/2017 09:43   Dg Chest Port 1 View  Result  Date: 08/18/2017 CLINICAL DATA:  Hypoxia EXAM: PORTABLE CHEST 1 VIEW COMPARISON:  August 17, 2017 FINDINGS: Endotracheal tube tip is 3.5 cm above the carina. Nasogastric tube tip and side port are below the diaphragm in the stomach. No pneumothorax. There are small pleural effusions bilaterally. There is interstitial pulmonary edema. There is atelectatic change in both lung bases. There is cardiomegaly with pulmonary venous hypertension. There is aortic atherosclerosis. No adenopathy. No bone lesions. IMPRESSION: Tube positions as described without pneumothorax. Pulmonary vascular congestion with interstitial edema and small pleural effusions bilaterally. Appearance radiographically is indicative of congestive heart failure. There is bibasilar atelectasis. Electronically Signed   By: Lowella Grip III M.D.   On: 08/18/2017 07:08   Dg Chest Port 1 View  Result Date: 08/17/2017 CLINICAL DATA:  Intubation. EXAM: PORTABLE CHEST 1 VIEW COMPARISON:  08/16/2017. FINDINGS: Endotracheal tube and NG tube in stable position. Cardiomegaly with  bilateral interstitial prominence consistent pulmonary edema again noted. No interim change. Small left pleural scratched it small pleural effusions can't be excluded. No pneumothorax. IMPRESSION: 1.  Endotracheal tube and NG tube in stable position. 2. Findings consistent congestive heart failure bilateral interstitial edema small pleural effusions. No interim change from prior exam. Electronically Signed   By: Marcello Moores  Register   On: 08/17/2017 05:49   Dg Chest Port 1 View  Result Date: 08/16/2017 CLINICAL DATA:  Respiratory failure. EXAM: PORTABLE CHEST 1 VIEW COMPARISON:  08/15/2017 FINDINGS: The patient is rotated to the right. Endotracheal tube terminates 4.2 cm above the carina. Enteric tube courses into the upper abdomen with tip not imaged. The cardiac silhouette remains enlarged. Lung volumes remain low with grossly similar appearance of left greater than right  basilar opacities suggesting atelectasis. Pulmonary vascular congestion and bilateral interstitial densities have slightly increased. No large pleural effusion or pneumothorax is identified. IMPRESSION: 1. Slightly increased pulmonary edema. 2. Bibasilar atelectasis. Electronically Signed   By: Logan Bores M.D.   On: 08/16/2017 07:48   Dg Chest Port 1 View  Result Date: 08/15/2017 CLINICAL DATA:  Respiratory failure, CHF, COPD EXAM: PORTABLE CHEST 1 VIEW COMPARISON:  Portable chest x-ray of August 14, 2017 FINDINGS: The lungs are mildly hypoinflated. The interstitial markings are coarse though slightly improved. There are densities at both lung bases which may reflect atelectasis. There is no pleural effusion or pneumothorax. The cardiac silhouette is enlarged. The pulmonary vascularity is mildly engorged but less prominent than on the previous study. There calcification in the wall of the aortic arch. The endotracheal tube tip projects 4.8 cm above the carina. The esophagogastric tube tip and proximal port project below the GE junction. IMPRESSION: Bilateral hypoinflation. Slightly decreased pulmonary interstitial edema. Stable cardiomegaly with decreased pulmonary vascular congestion. Probable bibasilar subsegmental atelectasis. The support tubes are in reasonable position. Thoracic aortic atherosclerosis. Electronically Signed   By: Datha Kissinger  [place] M.D.   On: 08/15/2017 08:40   Dg Chest Port 1 View  Result Date: 08/14/2017 CLINICAL DATA:  Respiratory failure EXAM: PORTABLE CHEST 1 VIEW COMPARISON:  Yesterday FINDINGS: Endotracheal tube tip is between the clavicular heads and carina. An orogastric tube and side-port reaches the stomach. Cardiopericardial enlargement and vascular pedicle widening. There is diffuse haziness of the chest and interstitial opacities. IMPRESSION: 1. Unremarkable positioning of endotracheal and orogastric tubes. 2. CHF pattern.  Pneumonia and atelectasis could be superimposed.  Electronically Signed   By: Monte Fantasia M.D.   On: 08/14/2017 06:48   Dg Chest Port 1 View  Result Date: 08/13/2017 CLINICAL DATA:  Respiratory failure EXAM: PORTABLE CHEST 1 VIEW COMPARISON:  08/12/2017 and prior radiographs FINDINGS: Endotracheal tube 2.5 cm above the carina, NG tube entering the stomach with tip off the field of view again noted. Cardiomegaly and bilateral interstitial/airspace opacities, left-greater-than-right, are not significantly changed. Left lower lung consolidation/atelectasis again noted. There is no evidence of pneumothorax. IMPRESSION: Unchanged appearance of the chest with continued bilateral interstitial/airspace opacities/edema and left lower lung consolidation/atelectasis. Electronically Signed   By: Margarette Canada M.D.   On: 08/13/2017 10:00   Dg Chest Port 1 View  Result Date: 08/12/2017 CLINICAL DATA:  Respiratory failure with shortness of breath EXAM: PORTABLE CHEST 1 VIEW COMPARISON:  Yesterday FINDINGS: Endotracheal tube tip between the clavicular heads and carina. Orogastric tube with tip at the GE junction. Cardiopericardial enlargement. There is diffuse hazy opacity of the chest with probable pleural fluid greater on the left. No visible pneumothorax. Artifact  from EKG leads. These results will be called to the ordering clinician or representative by the Radiologist Assistant, and communication documented in the PACS or zVision Dashboard. IMPRESSION: 1. Orogastric tube tip at the GE junction. Consider ~5 cm advancement into the stomach. 2. CHF pattern similar to yesterday. Electronically Signed   By: Monte Fantasia M.D.   On: 08/12/2017 07:52   Dg Chest Port 1 View  Result Date: 08/11/2017 CLINICAL DATA:  Respiratory failure EXAM: PORTABLE CHEST 1 VIEW COMPARISON:  08/10/2017 FINDINGS: Cardiac shadow remains enlarged. Aortic calcifications are again seen. Endotracheal tube and nasogastric catheter are noted in satisfactory position. Diffuse airspace opacities  are again identified bilaterally with some improved aeration in the left upper lobe. No bony abnormality is seen. IMPRESSION: Slight improved aeration particularly on the left. Electronically Signed   By: Inez Catalina M.D.   On: 08/11/2017 07:54   Portable Chest Xray  Result Date: 08/10/2017 CLINICAL DATA:  Endotracheal tube EXAM: PORTABLE CHEST 1 VIEW COMPARISON:  08/09/2017 FINDINGS: Endotracheal tube 3 cm above the carina.  NG tube in the stomach Diffuse bilateral airspace disease asymmetric on the left shows mild interval improvement. No significant effusion. IMPRESSION: Endotracheal tube 3 cm above the carina Severe diffuse bilateral airspace disease with mild interval improvement from yesterday. Electronically Signed   By: Franchot Gallo M.D.   On: 08/10/2017 06:51   Portable Chest X-ray  Result Date: 08/09/2017 CLINICAL DATA:  Endotracheal tube placement. EXAM: PORTABLE CHEST 1 VIEW COMPARISON:  Radiograph of August 08, 2017. FINDINGS: Mild cardiomegaly is noted with central pulmonary vascular congestion. Endotracheal tube is seen projected over tracheal air shadow with distal tip 2 cm above the carina. Nasogastric tube is seen passing through the esophagus into the stomach. Stable bilateral lung opacities are noted, left greater than right, most consistent with pneumonia or possibly edema. No pneumothorax is noted. Probable left pleural effusion cannot be excluded. Bony thorax is unremarkable. IMPRESSION: Endotracheal and nasogastric tubes are in grossly good position. Stable bilateral lung opacities are noted most consistent with pneumonia or possibly edema. Left pleural effusion cannot be excluded. Electronically Signed   By: Marijo Conception, M.D.   On: 08/09/2017 09:28   Dg Chest Port 1 View  Result Date: 08/08/2017 CLINICAL DATA:  Respiratory failure. EXAM: PORTABLE CHEST 1 VIEW COMPARISON:  August 05, 2017 FINDINGS: There has been progression of airspace consolidation on the left. There is  now airspace consolidation throughout most of the left lung. There is a small left pleural effusion. The right lung is clear. There is cardiomegaly with pulmonary venous hypertension. There is aortic atherosclerosis. No adenopathy. No evident bone lesions. IMPRESSION: Consolidation throughout most of the left lung felt to represent widespread pneumonia, progressed from recent study. Right lung clear. Underlying pulmonary vascular congestion. There is aortic atherosclerosis. Aortic Atherosclerosis (ICD10-I70.0). Electronically Signed   By: Lowella Grip III M.D.   On: 08/08/2017 09:27   Dg Chest Port 1v Same Day  Result Date: 08/17/2017 CLINICAL DATA:  Check gastric catheter placement EXAM: PORTABLE CHEST 1 VIEW COMPARISON:  08/17/2017 FINDINGS: Cardiac shadow is enlarged but stable. Endotracheal tube is noted in satisfactory position. Nasogastric catheter is noted tip within the stomach. Lungs are well aerated with vascular congestion and interstitial edema similar that seen prior exam. IMPRESSION: The overall appearance of the chest is stable from the prior exam. Electronically Signed   By: Inez Catalina M.D.   On: 08/17/2017 21:24   Dg Chest Port 1v Same Day  Result Date: 08/12/2017 CLINICAL DATA:  OG tube replacement. EXAM: PORTABLE CHEST 1 VIEW COMPARISON:  08/12/2017 FINDINGS: An endotracheal tube is identified with 2 cm above the carina. And OG tube enters the stomach with tip off the field of view. Cardiomegaly, pulmonary vascular congestion, right basilar atelectasis and left lower lung consolidation/atelectasis again noted. There is no evidence of pneumothorax. IMPRESSION: Support apparatus as described. Otherwise unchanged appearance of the chest. Electronically Signed   By: Margarette Canada M.D.   On: 08/12/2017 11:40    Orson Eva, DO  [facility] Pager [phone]  If 7PM-7AM, please contact night-coverage [email or web address] Password [email or web address] 08/20/2017, 10:25 AM   LOS: 15 days

## 2017-08-20 NOTE — Progress Notes (Signed)
Patient's family would like for patient to rest and have bath later in the day.  Explained importance of foley care.  Will return later today or have night shift bath patient.

## 2017-08-20 NOTE — Progress Notes (Signed)
Subjective: She is actually doing fairly well with weaning today but her daughter is worried because she does not see her chest moving as much as she would like.  She is maintaining oxygenation.  Part of the problem is that she becomes very agitated.  Staff concerned because she is not able to maintain her head at a 45 degree upright position so I think we should probably discontinue tube feedings because of risk of aspiration and may be bolus her when we do have her sitting up.  Objective: Vital signs in last 24 hours: Temp:  [97.6 F (36.4 C)-98.3 F (36.8 C)] 97.6 F (36.4 C) (02/17 0811) Pulse Rate:  [35-63] 52 (02/17 0811) Resp:  [7-19] 12 (02/17 0811) BP: (93-119)/(40-68) 97/43 (02/17 0630) SpO2:  [89 %-99 %] 96 % (02/17 0900) FiO2 (%):  [40 %] 40 % (02/17 1048) Weight change:  Last BM Date: 08/19/17  Intake/Output from previous day: 02/16 0701 - 02/17 0700 In: 2265.2 [I.V.:890.2; NG/GT:1375] Out: 3700 [Urine:3700]  PHYSICAL EXAM General appearance: Intubated sedated weaning now Resp: rhonchi bilaterally Cardio: regular rate and rhythm, S1, S2 normal, no murmur, click, rub or gallop GI: soft, non-tender; bowel sounds normal; no masses,  no organomegaly Extremities: extremities normal, atraumatic, no cyanosis or edema  Lab Results:  Results for orders placed or performed during the hospital encounter of 08/05/17 (from the past 48 hour(s))  Blood gas, arterial     Status: Abnormal   Collection Time: 08/18/17 11:30 AM  Result Value Ref Range   FIO2 40.00    Delivery systems VENTILATOR    Peep/cpap 5.0 cm H20   Pressure support 5 cm H20   pH, Arterial 7.337 (L) 7.350 - 7.450   pCO2 arterial 70.0 (HH) 32.0 - 48.0 mmHg    Comment: CRITICAL RESULT CALLED TO, READ BACK BY AND VERIFIED WITH: HYLTON,L.RN AT 1140 08/18/17 BY BROADNAX,L.RRT    pO2, Arterial 55.0 (L) 83.0 - 108.0 mmHg   Bicarbonate 31.8 (H) 20.0 - 28.0 mmol/L   Acid-Base Excess 10.5 (H) 0.0 - 2.0 mmol/L   O2  Saturation 82.8 %   Patient temperature 37.0    Collection site RIGHT BRACHIAL    Drawn by [id number]    Sample type ARTERIAL DRAW     Comment: Performed at [facility], [address] [place], [place] [address]  Glucose, capillary     Status: Abnormal   Collection Time: 08/18/17 11:44 AM  Result Value Ref Range   Glucose-Capillary 142 (H) 65 - 99 mg/dL   Comment 1 Notify RN    Comment 2 Document in Chart   Glucose, capillary     Status: Abnormal   Collection Time: 08/18/17  4:11 PM  Result Value Ref Range   Glucose-Capillary 165 (H) 65 - 99 mg/dL   Comment 1 Notify RN    Comment 2 Document in Chart   Glucose, capillary     Status: Abnormal   Collection Time: 08/18/17  8:03 PM  Result Value Ref Range   Glucose-Capillary 261 (H) 65 - 99 mg/dL   Comment 1 Notify RN    Comment 2 Document in Chart   Glucose, capillary     Status: Abnormal   Collection Time: 08/18/17 11:41 PM  Result Value Ref Range   Glucose-Capillary 177 (H) 65 - 99 mg/dL   Comment 1 Notify RN    Comment 2 Document in Chart   Blood gas, arterial     Status: Abnormal   Collection Time: 08/19/17  4:10  AM  Result Value Ref Range   FIO2 40.00    Delivery systems VENTILATOR    Mode PRESSURE REGULATED VOLUME CONTROL    VT 520 mL   LHR 12 resp/min   Peep/cpap 5.0 cm H20   pH, Arterial 7.439 7.350 - 7.450   pCO2 arterial 53.6 (H) 32.0 - 48.0 mmHg   pO2, Arterial 72.2 (L) 83.0 - 108.0 mmHg   Bicarbonate 33.7 (H) 20.0 - 28.0 mmol/L   Acid-Base Excess 11.0 (H) 0.0 - 2.0 mmol/L   O2 Saturation 93.7 %   Collection site RIGHT BRACHIAL    Drawn by [id number]    Sample type ARTERIAL    Allens test (pass/fail) NOT INDICATED (A) PASS    Comment: Performed at [facility], [address], [place], [place] [address]  Glucose, capillary     Status: None   Collection Time: 08/19/17  4:18 AM  Result Value Ref Range   Glucose-Capillary 69 65 - 99 mg/dL   Comment 1 Notify RN    Comment 2 Document in Chart   Glucose, capillary      Status: Abnormal   Collection Time: 08/19/17  4:38 AM  Result Value Ref Range   Glucose-Capillary 64 (L) 65 - 99 mg/dL  Basic metabolic panel     Status: Abnormal   Collection Time: 08/19/17  4:46 AM  Result Value Ref Range   Sodium 146 (H) 135 - 145 mmol/L   Potassium 3.4 (L) 3.5 - 5.1 mmol/L   Chloride 105 101 - 111 mmol/L   CO2 32 22 - 32 mmol/L   Glucose, Bld 108 (H) 65 - 99 mg/dL   BUN 42 (H) 6 - 20 mg/dL   Creatinine, Ser 0.77 0.44 - 1.00 mg/dL   Calcium 8.4 (L) 8.9 - 10.3 mg/dL   GFR calc non Af Amer >60 >60 mL/min   GFR calc Af Amer >60 >60 mL/min    Comment: (NOTE) The eGFR has been calculated using the CKD EPI equation. This calculation has not been validated in all clinical situations. eGFR's persistently <60 mL/min signify possible Chronic Kidney Disease.    Anion gap 9 5 - 15    Comment: Performed at [facility], [address] [place], [place] [address]  Brain natriuretic peptide     Status: Abnormal   Collection Time: 08/19/17  4:46 AM  Result Value Ref Range   B Natriuretic Peptide 316.0 (H) 0.0 - 100.0 pg/mL    Comment: Performed at [facility], [address] [place], [place] [address]  Glucose, capillary     Status: None   Collection Time: 08/19/17  5:26 AM  Result Value Ref Range   Glucose-Capillary 92 65 - 99 mg/dL   Comment 1 Notify RN    Comment 2 Document in Chart   Glucose, capillary     Status: None   Collection Time: 08/19/17  8:00 AM  Result Value Ref Range   Glucose-Capillary 76 65 - 99 mg/dL   Comment 1 Notify RN   Glucose, capillary     Status: Abnormal   Collection Time: 08/19/17 11:28 AM  Result Value Ref Range   Glucose-Capillary 148 (H) 65 - 99 mg/dL  Glucose, capillary     Status: Abnormal   Collection Time: 08/19/17  4:44 PM  Result Value Ref Range   Glucose-Capillary 231 (H) 65 - 99 mg/dL  Glucose, capillary     Status: Abnormal   Collection Time: 08/19/17  7:36 PM  Result Value Ref Range  Glucose-Capillary 246 (H) 65 - 99  mg/dL   Comment 1 Notify RN   Glucose, capillary     Status: Abnormal   Collection Time: 08/19/17 11:29 PM  Result Value Ref Range   Glucose-Capillary 220 (H) 65 - 99 mg/dL   Comment 1 Notify RN   Glucose, capillary     Status: Abnormal   Collection Time: 08/20/17  4:12 AM  Result Value Ref Range   Glucose-Capillary 229 (H) 65 - 99 mg/dL  Basic metabolic panel     Status: Abnormal   Collection Time: 08/20/17  4:29 AM  Result Value Ref Range   Sodium 146 (H) 135 - 145 mmol/L   Potassium 3.9 3.5 - 5.1 mmol/L   Chloride 103 101 - 111 mmol/L   CO2 34 (H) 22 - 32 mmol/L   Glucose, Bld 244 (H) 65 - 99 mg/dL   BUN 41 (H) 6 - 20 mg/dL   Creatinine, Ser 0.77 0.44 - 1.00 mg/dL   Calcium 8.4 (L) 8.9 - 10.3 mg/dL   GFR calc non Af Amer >60 >60 mL/min   GFR calc Af Amer >60 >60 mL/min    Comment: (NOTE) The eGFR has been calculated using the CKD EPI equation. This calculation has not been validated in all clinical situations. eGFR's persistently <60 mL/min signify possible Chronic Kidney Disease.    Anion gap 9 5 - 15    Comment: Performed at [facility], [address], [place], [place] [address]  Magnesium     Status: None   Collection Time: 08/20/17  4:29 AM  Result Value Ref Range   Magnesium 2.1 1.7 - 2.4 mg/dL    Comment: Performed at [facility], [address], [place], [place] [address]  Phosphorus     Status: None   Collection Time: 08/20/17  4:29 AM  Result Value Ref Range   Phosphorus 3.2 2.5 - 4.6 mg/dL    Comment: Performed at [facility], [address], [place], [place] [address]  Blood gas, arterial     Status: Abnormal   Collection Time: 08/20/17  5:35 AM  Result Value Ref Range   FIO2 40.00    Delivery systems VENTILATOR    Mode PRESSURE REGULATED VOLUME CONTROL    VT 520 mL   LHR 12 resp/min   Peep/cpap 5.0 cm H20   pH, Arterial 7.467 (H) 7.350 - 7.450   pCO2 arterial 50.0 (H) 32.0 - 48.0 mmHg   pO2, Arterial 66.9 (L) 83.0 - 108.0 mmHg   Bicarbonate 34.5  (H) 20.0 - 28.0 mmol/L   Acid-Base Excess 11.3 (H) 0.0 - 2.0 mmol/L   O2 Saturation 92.4 %   Collection site BRACHIAL ARTERY    Drawn by [id number]    Sample type ARTERIAL    Mechanical Rate 12     Comment: Performed at [facility], [address], [place],  [address]  Glucose, capillary     Status: Abnormal   Collection Time: 08/20/17  8:10 AM  Result Value Ref Range   Glucose-Capillary 216 (H) 65 - 99 mg/dL    ABGS Recent Labs    08/20/17 0535  PHART 7.467*  PO2ART 66.9*  HCO3 34.5*   CULTURES Recent Results (from the past 240 hour(s))  Culture, Urine     Status: Abnormal   Collection Time: 08/17/17  4:00 PM  Result Value Ref Range Status   Specimen Description   Final    URINE, RANDOM Performed at [facility], [address], [place],  [place] [address]    Special Requests   Final    NONE Performed at [facility], [address] [place], [place] [address]    Culture 50,000 COLONIES/mL YEAST (A)  Final   Report Status 08/19/2017 FINAL  Final   Studies/Results: Dg Chest 1 View  Result Date: 08/19/2017 CLINICAL DATA:  Respiratory failure EXAM: CHEST 1 VIEW COMPARISON:  08/18/2017 FINDINGS: Endotracheal tube in good position. NG tube remains in place. Left arm PICC tip in the proximal SVC. Cardiac enlargement with improvement in pulmonary vascular congestion. Negative for edema. Improvement in bibasilar atelectasis. IMPRESSION: Improvement in congestive heart failure and bibasilar atelectasis. Electronically Signed   By: Franchot Gallo M.D.   On: 08/19/2017 08:28    Medications:  Prior to Admission:  Medications Prior to Admission  Medication Sig Dispense Refill Last Dose  . acetaminophen (TYLENOL) 500 MG tablet Take 500 mg by mouth every 6 (six) hours as needed for moderate pain or headache.   08/05/2017 at 0630  . albuterol (PROVENTIL HFA;VENTOLIN HFA) 108 (90 Base) MCG/ACT inhaler Inhale 2 puffs into the lungs every 4 (four) hours as needed for wheezing or  shortness of breath. 1 Inhaler 0 08/04/2017 at Unknown time  . amiodarone (PACERONE) 200 MG tablet Take 1 tablet (200 mg total) by mouth every morning. 90 tablet 3 08/05/2017 at Unknown time  . amoxicillin (AMOXIL) 500 MG capsule Take 1 capsule by mouth 3 (three) times daily.  0 08/05/2017 at 0700  . apixaban (ELIQUIS) 5 MG TABS tablet Take 1 tablet (5 mg total) by mouth 2 (two) times daily. 60 tablet 0 08/05/2017 at Unknown time  . Cyanocobalamin (B-12) 5000 MCG SUBL Take 1 tablet by mouth daily.  12 08/05/2017 at 0700  . dicyclomine (BENTYL) 10 MG capsule TAKE 1 TO 2 CAPSULES BY MOUTH BEFORE meals AS NEEDED 180 capsule 3 08/04/2017 at Unknown time  . DULoxetine (CYMBALTA) 30 MG capsule Take 30 mg by mouth 2 (two) times daily.   08/05/2017 at 0700  . famotidine (PEPCID) 20 MG tablet Take 20 mg by mouth at bedtime.    08/04/2017 at Unknown time  . furosemide (LASIX) 40 MG tablet TAKE 40 MG DAILY. 90 tablet 3 08/05/2017 at Unknown time  . gabapentin (NEURONTIN) 400 MG capsule Take 400-800 mg by mouth 3 (three) times daily. Take 1 capsule(410m) by mouth twice a day and take 2 capsules(8087m by mouth at bedtime  11 08/05/2017 at 0700  . glipiZIDE (GLUCOTROL) 10 MG tablet Take 10 mg by mouth 2 (two) times daily before a meal.    08/05/2017 at Unknown time  . Insulin Glargine (LANTUS SOLOSTAR) 100 UNIT/ML Solostar Pen Inject 20-50 Units into the skin at bedtime as needed. Patient takes 20 units every night at bedtime if over 178.  Takes 50 units if her blood sugar is above 300.   08/04/2017 at 2200  . levETIRAcetam (KEPPRA) 1000 MG tablet Take 1,000 mg by mouth 2 (two) times daily.  1 08/05/2017 at 0700  . levothyroxine (SYNTHROID, LEVOTHROID) 137 MCG tablet Take 137 mcg by mouth daily.   08/05/2017 at 0700  . loperamide (IMODIUM) 2 MG capsule TAKE ONE CAPSULE BY MOUTH TWICE DAILY AS NEEDED 60 capsule 3 08/04/2017 at Unknown time  . LORazepam (ATIVAN) 1 MG tablet Take 1 mg by mouth 3 (three) times daily as needed for anxiety or  sleep. For anxiety   08/04/2017 at 2200  . magnesium oxide (MAG-OX) 400 MG tablet Take 400 mg by mouth daily.  08/05/2017 at Unknown time  . metaxalone (SKELAXIN) 800 MG tablet Take 800 mg by mouth 3 (three) times daily.    08/05/2017 at 0700  . metFORMIN (GLUCOPHAGE) 1000 MG tablet Take 1,000 mg by mouth 2 (two) times daily with a meal.    08/05/2017 at 0700  . Multiple Vitamin (MULTIVITAMIN WITH MINERALS) TABS tablet Take 1 tablet by mouth daily.   08/05/2017 at 0700  . nitroGLYCERIN (NITROSTAT) 0.4 MG SL tablet Place 0.4 mg under the tongue every 5 (five) minutes as needed for chest pain. Reported on 07/08/2015   Taking  . ondansetron (ZOFRAN) 4 MG tablet TAKE ONE TABLET BY MOUTH FOUR TIMES DAILY FOR NAUSEA (Patient taking differently: TAKE ONE TABLET BY MOUTH FOUR TIMES DAILY FOR NAUSEA. Takes as needed) 120 tablet 3 Past Week at Unknown time  . pantoprazole (PROTONIX) 40 MG tablet TAKE ONE TABLET BY MOUTH TWICE DAILY BEFORE APPLY MEAL 180 tablet 3 08/05/2017 at 0700  . potassium chloride (K-DUR,KLOR-CON) 10 MEQ tablet TAKE TWO TABLETS BY MOUTH TWICE DAILY 120 tablet 11 08/05/2017 at 0700  . promethazine (PHENERGAN) 12.5 MG tablet Take 1 tablet (12.5 mg total) by mouth every 4 (four) hours as needed for nausea or vomiting. 42 tablet 0 Taking  . UNABLE TO FIND Take 1 tablet by mouth 2 (two) times daily. Medication Name: [employer] RX Multivitamin Diabetes Support  12 08/05/2017 at 0700   Scheduled: . amiodarone  200 mg Oral q morning - 10a  . apixaban  5 mg Oral BID  . chlorhexidine gluconate (MEDLINE KIT)  15 mL Mouth Rinse BID  . Chlorhexidine Gluconate Cloth  6 each Topical Daily  . dextromethorphan  30 mg Oral BID  . furosemide  40 mg Intravenous BID  . gabapentin  400 mg Oral 2 times per day  . gabapentin  800 mg Oral QHS  . insulin aspart  0-20 Units Subcutaneous Q4H  . insulin aspart  4 Units Subcutaneous Q4H  . insulin glargine  55 Units Subcutaneous QHS  . ipratropium-albuterol  3 mL Nebulization  Q6H  . ketoconazole   Topical BID  . levETIRAcetam  1,000 mg Oral BID  . levothyroxine  75 mcg Intravenous Daily  . magnesium oxide  400 mg Oral Daily  . mouth rinse  15 mL Mouth Rinse QID  . methylPREDNISolone (SOLU-MEDROL) injection  40 mg Intravenous Daily  . multivitamin  15 mL Oral Daily  . nystatin   Topical TID  . pantoprazole (PROTONIX) IV  40 mg Intravenous Q24H  . potassium chloride  40 mEq Oral Daily  . sodium chloride flush  10-40 mL Intracatheter Q12H   Continuous: . feeding supplement (VITAL HIGH PROTEIN) 1,000 mL (08/20/17 0414)  . fentaNYL infusion INTRAVENOUS 275 mcg/hr (08/20/17 1055)  . midazolam (VERSED) infusion 6 mg/hr (08/20/17 7048)   GQB:[id number] (TYLENOL) oral liquid 160 mg/5 mL, albuterol, fentaNYL, fentaNYL (SUBLIMAZE) injection, loperamide, midazolam, nitroGLYCERIN, ondansetron (ZOFRAN) IV, sodium chloride flush  Assesment: She was admitted with community-acquired pneumonia that failed outpatient treatment.  She continued to worsen and required intubation and mechanical ventilation starting 12 days ago.  We had difficulty weaning her because of a multitude of factors including agitation difficulty understanding the instructions because of her known cognitive impairment and her severe pneumonia.  She may have some element of diastolic heart failure and she is being diuresed for that.  I am concerned that she may have had trouble with aspiration because she is very difficult to keep at a 45 degree angle.  Principal Problem:   CAP Failed Outpatient Tx Active Problems:   Hypothyroidism   Hyperlipidemia   Essential hypertension   Atrial fibrillation (HCC)   Congestive heart failure (HCC)   Gastroparesis   Sleep apnea   Chronic anticoagulation   Tremor   Dementia   DM type 2 (diabetes mellitus, type 2) (HCC)   Chronic diastolic CHF (congestive heart failure) (HCC)   Pulmonary hypertension (HCC)   COPD exacerbation (HCC)   Acute respiratory failure with  hypercapnia (HCC)   Acute respiratory failure with hypoxia and hypercapnia (HCC)   Uncontrolled type 2 diabetes mellitus with hyperglycemia (HCC)   Acute on chronic diastolic CHF (congestive heart failure) ([place])    Plan: Discontinue tube feedings for now except when she is upright and then we can bolus her.  Continue trying to wean.  Discussed with Dr. Carles Collet and agree with his plan for palliative care consultation    LOS: 15 days   Zaheer Wageman L 08/20/2017, 11:00 AM

## 2017-08-21 ENCOUNTER — Inpatient Hospital Stay (HOSPITAL_COMMUNITY): Payer: Medicare Other

## 2017-08-21 ENCOUNTER — Inpatient Hospital Stay (HOSPITAL_COMMUNITY): Payer: Medicare Other | Admitting: Anesthesiology

## 2017-08-21 ENCOUNTER — Encounter (HOSPITAL_COMMUNITY): Payer: Self-pay | Admitting: Primary Care

## 2017-08-21 DIAGNOSIS — Z515 Encounter for palliative care: Secondary | ICD-10-CM

## 2017-08-21 DIAGNOSIS — J9601 Acute respiratory failure with hypoxia: Secondary | ICD-10-CM

## 2017-08-21 DIAGNOSIS — Z7189 Other specified counseling: Secondary | ICD-10-CM

## 2017-08-21 LAB — BASIC METABOLIC PANEL
ANION GAP: 10 (ref 5–15)
BUN: 43 mg/dL — ABNORMAL HIGH (ref 6–20)
CALCIUM: 8.7 mg/dL — AB (ref 8.9–10.3)
CHLORIDE: 104 mmol/L (ref 101–111)
CO2: 35 mmol/L — AB (ref 22–32)
Creatinine, Ser: 0.79 mg/dL (ref 0.44–1.00)
GFR calc non Af Amer: >60 mL/min (ref >60)
Glucose, Bld: 140 mg/dL — ABNORMAL HIGH (ref 65–99)
POTASSIUM: 3.9 mmol/L (ref 3.5–5.1)
Sodium: 149 mmol/L — ABNORMAL HIGH (ref 135–145)

## 2017-08-21 LAB — CBC
HEMATOCRIT: 32.6 % — AB (ref 36.0–46.0)
HEMOGLOBIN: 9.5 g/dL — AB (ref 12.0–15.0)
MCH: 30.8 pg (ref 26.0–34.0)
MCHC: 29.1 g/dL — ABNORMAL LOW (ref 30.0–36.0)
MCV: 105.8 fL — ABNORMAL HIGH (ref 78.0–100.0)
Platelets: 212 10*3/uL (ref 150–400)
RBC: 3.08 MIL/uL — AB (ref 3.87–5.11)
RDW: 15.1 % (ref 11.5–15.5)
WBC: 12.1 10*3/uL — ABNORMAL HIGH (ref 4.0–10.5)

## 2017-08-21 LAB — BLOOD GAS, ARTERIAL
Acid-Base Excess: 11.4 mmol/L — ABNORMAL HIGH (ref 0.0–2.0)
Bicarbonate: 34.4 mmol/L — ABNORMAL HIGH (ref 20.0–28.0)
Drawn by: 105551
FIO2: 45
O2 Saturation: 96.3 %
PEEP/CPAP: 5 cmH2O
PRESSURE SUPPORT: 12 cmH2O
pCO2 arterial: 55.5 mmHg — ABNORMAL HIGH (ref 32.0–48.0)
pH, Arterial: 7.43 (ref 7.350–7.450)
pO2, Arterial: 87.8 mmHg (ref 83.0–108.0)

## 2017-08-21 LAB — GLUCOSE, CAPILLARY
GLUCOSE-CAPILLARY: 168 mg/dL — AB (ref 65–99)
GLUCOSE-CAPILLARY: 193 mg/dL — AB (ref 65–99)
Glucose-Capillary: 161 mg/dL — ABNORMAL HIGH (ref 65–99)
Glucose-Capillary: 185 mg/dL — ABNORMAL HIGH (ref 65–99)
Glucose-Capillary: 186 mg/dL — ABNORMAL HIGH (ref 65–99)
Glucose-Capillary: 211 mg/dL — ABNORMAL HIGH (ref 65–99)
Glucose-Capillary: 96 mg/dL (ref 65–99)

## 2017-08-21 LAB — TRIGLYCERIDES: Triglycerides: 81 mg/dL (ref <150)

## 2017-08-21 LAB — BRAIN NATRIURETIC PEPTIDE: B Natriuretic Peptide: 333 pg/mL — ABNORMAL HIGH (ref 0.0–100.0)

## 2017-08-21 MED ORDER — ETOMIDATE 2 MG/ML IV SOLN
0.3000 mg/kg | Freq: Once | INTRAVENOUS | Status: AC
  Administered 2017-08-21: 10 mg via INTRAVENOUS

## 2017-08-21 MED ORDER — SUCCINYLCHOLINE CHLORIDE 20 MG/ML IJ SOLN
80.0000 mg | Freq: Once | INTRAMUSCULAR | Status: AC
  Administered 2017-08-21: 80 mg via INTRAVENOUS

## 2017-08-21 MED ORDER — MIDAZOLAM HCL 50 MG/10ML IJ SOLN
0.5000 mg/h | INTRAMUSCULAR | Status: DC
  Administered 2017-08-21 – 2017-08-22 (×3): 6 mg/h via INTRAVENOUS
  Administered 2017-08-22 (×2): 5 mg/h via INTRAVENOUS
  Administered 2017-08-22: 4.5 mg/h via INTRAVENOUS
  Filled 2017-08-21 (×2): qty 10

## 2017-08-21 MED ORDER — VITAL HIGH PROTEIN PO LIQD
1000.0000 mL | ORAL | Status: DC
  Administered 2017-08-21: 1000 mL via OROGASTRIC
  Filled 2017-08-21 (×2): qty 1000

## 2017-08-21 MED ORDER — MIDAZOLAM 50MG/50ML (1MG/ML) PREMIX INFUSION
INTRAVENOUS | Status: AC
  Filled 2017-08-21: qty 50

## 2017-08-21 MED ORDER — FENTANYL CITRATE (PF) 2500 MCG/50ML IJ SOLN
INTRAMUSCULAR | Status: AC
  Filled 2017-08-21: qty 50

## 2017-08-21 MED FILL — Medication: Qty: 1 | Status: AC

## 2017-08-21 NOTE — Progress Notes (Signed)
PROGRESS NOTE  [name] LYY:[id number] DOB: 11/12/1950 DOA: 08/05/2017 PCP: Lemmie Evens, MD  Brief History: 67 y.o.femalewith multiple medical comorbidities detailed below presented to the emergency department with persistent cough and shortness of breath for the past 2 weeks that failed outpatient management with 2 courses of oral amoxicillin. Patient reports that she is on her second round of amoxicillin and continues to feel short of breath. She has nonproductive cough. She has some wheezing. She has no chest pain. The patient has no swelling in the extremities. She has a congestive heart failure that is chronic and she is taking her diuretics regularly. She denies palpitations. She presented to the ED and noted to be tachypneic. She was afebrile. A chest x-ray was obtained that did show a large left retro-cardiac opacity that was suspicious for pneumonia. EKG was unremarkable except for notation of prolonged PR interval. Her troponin was 0.07. She had a mildly elevated BNP at 189. The patient's blood sugar was elevated at 236. She was given treatment in the emergency department but remained tachypneic.   She was admitted to the hospitaland initially required BiPAP therapy, but then decompensated and required intubation. She has been slow to progress. Weaning trials have been complicated by development of agitation. The patient was noted to have acute on chronic diastolic CHF. She was started on intravenous furosemide with good clinical effect. Weaning trials have been difficult in part due to agitation. Pulmonology following.    Assessment/Plan: Acute respiratory failure with hypoxia and hypercapnia -Multifactorial with the main driving factor related to pneumonia -The patient also has COPD and diastolic CHF with likely undiagnosed OSA/OHS -Appreciate pulmonary follow-up -Continue weaning from mechanical ventilation--pt tolerated CPAP/PS all night  2/17-2/18 -Extubated am 08/21/17 -Continue Solu-Medrol--decrease to once daily -2/18--personally reviewed CXR--interstitial prominence but improving -will need swallow evaluation if tolerates extubation  Lobar Pneumonia -check PCT<0.10 -d/cedabx after2/13doses--completed12 days  Acute onChronic diastolic CHF -ContinueIV lasix -maintain neg fluid balance--I/O not accurate past 24 hours -2/13BNP--324.0 -12/26/2015 Echo--normal EF, grade 1 DD, mild MR, PAS P 44 -Daily weights--NEG8lbs -repeat Echo--EF 55-60%, grade 2 DD, PAS P 57, mild TR  Paroxysmal atrial fibrillation -Continue amiodarone -Presently in sinus rhythm -Continue apixaban  Essential hypertension -Stable presently  COPD -No wheezing presently  Diabetes mellitus type 2, uncontrolled with hyperglycemia -Check hemoglobin A1c--9.4 -may need to decrease Lantus now that enteral feeding are off -Continue NovoLog sliding scale -stop NovoLog 4 unitsq 4hr with extubation and enteral feeds off -will not change insulinas CBGs improving with decreased steroids  Hypothyroidism -Continue Synthroid   Seizure disorder -Continue Keppra  Hypokalemia -repletes -check mag--2.1  Goals of Care -daughter wishes for full scope of care -consult palliative medicine     Disposition Plan:Remain in ICU Family Communication:Daughter updateedat bedside2/18--Total time spent 69mnutes. Greater than 50% spent face to face counseling and coordinating care.    Consultants:pulmonary  Code Status: FULL   DVT Prophylaxis:apixaban   Procedures: As Listed in Progress Note Above  Antibiotics: Cefepime 2/5>>>2/13 vanc 2/5>>>2/8  The patient is critically ill with multiple organ systems failure and requires high complexity decision making for assessment and support, frequent evaluation and titration of therapies, application of advanced monitoring technologies and extensive  interpretation of multiple databases.  Critical care time - 318ms.       Subjective: Patient awakens to voice, but not following commands at this time.  No reports of vomiting, respiratory distress, diarrhea, uncontrolled pain.  Objective: Vitals:  08/21/17 0600 08/21/17 0630 08/21/17 0800 08/21/17 0900  BP: (!) 86/60 (!) 101/51    Pulse: (!) 45 (!) 44    Resp: 15 15    Temp:   98.2 F (36.8 C)   TempSrc:   Axillary   SpO2: 96% 96%  90%  Weight:      Height:        Intake/Output Summary (Last 24 hours) at 08/21/2017 0934 Last data filed at 08/21/2017 0500 Gross per 24 hour  Intake 10 ml  Output 1800 ml  Net -1790 ml   Weight change:  Exam:   General:  Pt is alert, does not follow commands appropriately  HEENT: No icterus, No thrush, No neck mass, [place]/AT  Cardiovascular: RRR, S1/S2, no rubs, no gallops  Respiratory: Bilateral scattered rhonchi.  No wheezing.  Good air movement.  Abdomen: Soft/+BS, non tender, non distended, no guarding  Extremities: No edema, No lymphangitis, No petechiae, No rashes, no synovitis   Data Reviewed: I have personally reviewed following labs and imaging studies Basic Metabolic Panel: Recent Labs  Lab 08/17/17 0457 08/18/17 0441 08/19/17 0446 08/20/17 0429 08/21/17 0512  NA 143 146* 146* 146* 149*  K 3.9 3.1* 3.4* 3.9 3.9  CL 102 103 105 103 104  CO2 32 34* 32 34* 35*  GLUCOSE 301* 175* 108* 244* 140*  BUN 47* 44* 42* 41* 43*  CREATININE 0.89 0.79 0.77 0.77 0.79  CALCIUM 8.8* 8.6* 8.4* 8.4* 8.7*  MG 2.2  --   --  2.1  --   PHOS 3.9  --   --  3.2  --    Liver Function Tests: No results for input(s): AST, ALT, ALKPHOS, BILITOT, PROT, ALBUMIN in the last 168 hours. No results for input(s): LIPASE, AMYLASE in the last 168 hours. No results for input(s): AMMONIA in the last 168 hours. Coagulation Profile: No results for input(s): INR, PROTIME in the last 168 hours. CBC: Recent Labs  Lab 08/15/17 0431 08/16/17 0529  08/17/17 0457 08/18/17 0441 08/21/17 0512  WBC 14.5* 14.9* 12.0* 12.1* 12.1*  NEUTROABS  --   --  10.3*  --   --   HGB 10.3* 10.3* 9.9* 9.6* 9.5*  HCT 34.6* 34.2* 33.2* 31.7* 32.6*  MCV 105.5* 105.2* 105.1* 105.0* 105.8*  PLT 198 216 213 203 212   Cardiac Enzymes: No results for input(s): CKTOTAL, CKMB, CKMBINDEX, TROPONINI in the last 168 hours. BNP: Invalid input(s): POCBNP CBG: Recent Labs  Lab 08/20/17 1635 08/20/17 2004 08/21/17 0011 08/21/17 0351 08/21/17 0837  GLUCAP 208* 190* 211* 186* 96   HbA1C: No results for input(s): HGBA1C in the last 72 hours. Urine analysis:    Component Value Date/Time   COLORURINE AMBER (A) 08/17/2017 1600   APPEARANCEUR CLOUDY (A) 08/17/2017 1600   LABSPEC 1.020 08/17/2017 1600   PHURINE 6.0 08/17/2017 1600   GLUCOSEU NEGATIVE 08/17/2017 1600   HGBUR LARGE (A) 08/17/2017 1600   BILIRUBINUR NEGATIVE 08/17/2017 1600   KETONESUR NEGATIVE 08/17/2017 1600   PROTEINUR 100 (A) 08/17/2017 1600   UROBILINOGEN 0.2 02/05/2015 1700   NITRITE NEGATIVE 08/17/2017 1600   LEUKOCYTESUR MODERATE (A) 08/17/2017 1600   Sepsis Labs: _0 (procalcitonin:4,lacticidven:4) ) Recent Results (from the past 240 hour(s))  Culture, Urine     Status: Abnormal   Collection Time: 08/17/17  4:00 PM  Result Value Ref Range Status   Specimen Description   Final    URINE, RANDOM Performed at [facility], [address] [place], [place] [address]  Special Requests   Final    NONE Performed at [facility], [address], [place], [place] [address]    Culture 50,000 COLONIES/mL YEAST (A)  Final   Report Status 08/19/2017 FINAL  Final     Scheduled Meds: . amiodarone  200 mg Oral q morning - 10a  . apixaban  5 mg Oral BID  . chlorhexidine gluconate (MEDLINE KIT)  15 mL Mouth Rinse BID  . Chlorhexidine Gluconate Cloth  6 each Topical Daily  . dextromethorphan  30 mg Oral BID  . furosemide  40 mg Intravenous BID  . gabapentin  400 mg Oral 2 times  per day  . gabapentin  800 mg Oral QHS  . insulin aspart  0-20 Units Subcutaneous Q4H  . insulin aspart  4 Units Subcutaneous Q4H  . insulin glargine  55 Units Subcutaneous QHS  . ipratropium-albuterol  3 mL Nebulization Q6H  . ketoconazole   Topical BID  . levETIRAcetam  1,000 mg Oral BID  . levothyroxine  75 mcg Intravenous Daily  . magnesium oxide  400 mg Oral Daily  . mouth rinse  15 mL Mouth Rinse QID  . methylPREDNISolone (SOLU-MEDROL) injection  40 mg Intravenous Daily  . multivitamin  15 mL Oral Daily  . nystatin   Topical TID  . pantoprazole (PROTONIX) IV  40 mg Intravenous Q24H  . potassium chloride  40 mEq Oral Daily  . sodium chloride flush  10-40 mL Intracatheter Q12H   Continuous Infusions: . fentaNYL infusion INTRAVENOUS 150 mcg/hr (08/21/17 0658)  . midazolam (VERSED) infusion 3 mg/hr (08/21/17 0658)    Procedures/Studies: Dg Chest 1 View  Result Date: 08/19/2017 CLINICAL DATA:  Respiratory failure EXAM: CHEST 1 VIEW COMPARISON:  08/18/2017 FINDINGS: Endotracheal tube in good position. NG tube remains in place. Left arm PICC tip in the proximal SVC. Cardiac enlargement with improvement in pulmonary vascular congestion. Negative for edema. Improvement in bibasilar atelectasis. IMPRESSION: Improvement in congestive heart failure and bibasilar atelectasis. Electronically Signed   By: Franchot Gallo M.D.   On: 08/19/2017 08:28   Dg Chest 2 View  Result Date: 08/05/2017 CLINICAL DATA:  Shortness of breath.  Cough. EXAM: CHEST  2 VIEW COMPARISON:  September 30, 2016 FINDINGS: No pneumothorax. Stable cardiomegaly. The hila and mediastinum are unchanged. There appears to be increased opacity in the left retrocardiac region which could represent pneumonia given history. Recommend follow-up to resolution. IMPRESSION: Left retrocardiac opacity may represent pneumonia given history. Recommend follow-up to resolution. Electronically Signed   By: Dorise Bullion III M.D   On: 08/05/2017  09:43   Dg Chest Port 1 View  Result Date: 08/21/2017 CLINICAL DATA:  Respiratory failure EXAM: PORTABLE CHEST 1 VIEW COMPARISON:  08/19/2017 FINDINGS: Endotracheal tube in good position.  NG tube enters the stomach. Left lower lobe airspace disease unchanged. Right lung remains clear. Cardiac enlargement without heart failure. IMPRESSION: Endotracheal tube remains in good position. Left lower lobe atelectasis/infiltrate unchanged. Electronically Signed   By: Franchot Gallo M.D.   On: 08/21/2017 08:19   Dg Chest Port 1 View  Result Date: 08/18/2017 CLINICAL DATA:  Hypoxia EXAM: PORTABLE CHEST 1 VIEW COMPARISON:  August 17, 2017 FINDINGS: Endotracheal tube tip is 3.5 cm above the carina. Nasogastric tube tip and side port are below the diaphragm in the stomach. No pneumothorax. There are small pleural effusions bilaterally. There is interstitial pulmonary edema. There is atelectatic change in both lung bases. There is cardiomegaly with pulmonary venous hypertension. There is aortic atherosclerosis. No  adenopathy. No bone lesions. IMPRESSION: Tube positions as described without pneumothorax. Pulmonary vascular congestion with interstitial edema and small pleural effusions bilaterally. Appearance radiographically is indicative of congestive heart failure. There is bibasilar atelectasis. Electronically Signed   By: Lowella Grip III M.D.   On: 08/18/2017 07:08   Dg Chest Port 1 View  Result Date: 08/17/2017 CLINICAL DATA:  Intubation. EXAM: PORTABLE CHEST 1 VIEW COMPARISON:  08/16/2017. FINDINGS: Endotracheal tube and NG tube in stable position. Cardiomegaly with bilateral interstitial prominence consistent pulmonary edema again noted. No interim change. Small left pleural scratched it small pleural effusions can't be excluded. No pneumothorax. IMPRESSION: 1.  Endotracheal tube and NG tube in stable position. 2. Findings consistent congestive heart failure bilateral interstitial edema small pleural  effusions. No interim change from prior exam. Electronically Signed   By: Marcello Moores  Register   On: 08/17/2017 05:49   Dg Chest Port 1 View  Result Date: 08/16/2017 CLINICAL DATA:  Respiratory failure. EXAM: PORTABLE CHEST 1 VIEW COMPARISON:  08/15/2017 FINDINGS: The patient is rotated to the right. Endotracheal tube terminates 4.2 cm above the carina. Enteric tube courses into the upper abdomen with tip not imaged. The cardiac silhouette remains enlarged. Lung volumes remain low with grossly similar appearance of left greater than right basilar opacities suggesting atelectasis. Pulmonary vascular congestion and bilateral interstitial densities have slightly increased. No large pleural effusion or pneumothorax is identified. IMPRESSION: 1. Slightly increased pulmonary edema. 2. Bibasilar atelectasis. Electronically Signed   By: Logan Bores M.D.   On: 08/16/2017 07:48   Dg Chest Port 1 View  Result Date: 08/15/2017 CLINICAL DATA:  Respiratory failure, CHF, COPD EXAM: PORTABLE CHEST 1 VIEW COMPARISON:  Portable chest x-ray of August 14, 2017 FINDINGS: The lungs are mildly hypoinflated. The interstitial markings are coarse though slightly improved. There are densities at both lung bases which may reflect atelectasis. There is no pleural effusion or pneumothorax. The cardiac silhouette is enlarged. The pulmonary vascularity is mildly engorged but less prominent than on the previous study. There calcification in the wall of the aortic arch. The endotracheal tube tip projects 4.8 cm above the carina. The esophagogastric tube tip and proximal port project below the GE junction. IMPRESSION: Bilateral hypoinflation. Slightly decreased pulmonary interstitial edema. Stable cardiomegaly with decreased pulmonary vascular congestion. Probable bibasilar subsegmental atelectasis. The support tubes are in reasonable position. Thoracic aortic atherosclerosis. Electronically Signed   By: Kolter Reaver  [place] M.D.   On: 08/15/2017  08:40   Dg Chest Port 1 View  Result Date: 08/14/2017 CLINICAL DATA:  Respiratory failure EXAM: PORTABLE CHEST 1 VIEW COMPARISON:  Yesterday FINDINGS: Endotracheal tube tip is between the clavicular heads and carina. An orogastric tube and side-port reaches the stomach. Cardiopericardial enlargement and vascular pedicle widening. There is diffuse haziness of the chest and interstitial opacities. IMPRESSION: 1. Unremarkable positioning of endotracheal and orogastric tubes. 2. CHF pattern.  Pneumonia and atelectasis could be superimposed. Electronically Signed   By: Monte Fantasia M.D.   On: 08/14/2017 06:48   Dg Chest Port 1 View  Result Date: 08/13/2017 CLINICAL DATA:  Respiratory failure EXAM: PORTABLE CHEST 1 VIEW COMPARISON:  08/12/2017 and prior radiographs FINDINGS: Endotracheal tube 2.5 cm above the carina, NG tube entering the stomach with tip off the field of view again noted. Cardiomegaly and bilateral interstitial/airspace opacities, left-greater-than-right, are not significantly changed. Left lower lung consolidation/atelectasis again noted. There is no evidence of pneumothorax. IMPRESSION: Unchanged appearance of the chest with continued bilateral interstitial/airspace opacities/edema and left lower lung  consolidation/atelectasis. Electronically Signed   By: Margarette Canada M.D.   On: 08/13/2017 10:00   Dg Chest Port 1 View  Result Date: 08/12/2017 CLINICAL DATA:  Respiratory failure with shortness of breath EXAM: PORTABLE CHEST 1 VIEW COMPARISON:  Yesterday FINDINGS: Endotracheal tube tip between the clavicular heads and carina. Orogastric tube with tip at the GE junction. Cardiopericardial enlargement. There is diffuse hazy opacity of the chest with probable pleural fluid greater on the left. No visible pneumothorax. Artifact from EKG leads. These results will be called to the ordering clinician or representative by the Radiologist Assistant, and communication documented in the PACS or zVision  Dashboard. IMPRESSION: 1. Orogastric tube tip at the GE junction. Consider ~5 cm advancement into the stomach. 2. CHF pattern similar to yesterday. Electronically Signed   By: Monte Fantasia M.D.   On: 08/12/2017 07:52   Dg Chest Port 1 View  Result Date: 08/11/2017 CLINICAL DATA:  Respiratory failure EXAM: PORTABLE CHEST 1 VIEW COMPARISON:  08/10/2017 FINDINGS: Cardiac shadow remains enlarged. Aortic calcifications are again seen. Endotracheal tube and nasogastric catheter are noted in satisfactory position. Diffuse airspace opacities are again identified bilaterally with some improved aeration in the left upper lobe. No bony abnormality is seen. IMPRESSION: Slight improved aeration particularly on the left. Electronically Signed   By: Inez Catalina M.D.   On: 08/11/2017 07:54   Portable Chest Xray  Result Date: 08/10/2017 CLINICAL DATA:  Endotracheal tube EXAM: PORTABLE CHEST 1 VIEW COMPARISON:  08/09/2017 FINDINGS: Endotracheal tube 3 cm above the carina.  NG tube in the stomach Diffuse bilateral airspace disease asymmetric on the left shows mild interval improvement. No significant effusion. IMPRESSION: Endotracheal tube 3 cm above the carina Severe diffuse bilateral airspace disease with mild interval improvement from yesterday. Electronically Signed   By: Franchot Gallo M.D.   On: 08/10/2017 06:51   Portable Chest X-ray  Result Date: 08/09/2017 CLINICAL DATA:  Endotracheal tube placement. EXAM: PORTABLE CHEST 1 VIEW COMPARISON:  Radiograph of August 08, 2017. FINDINGS: Mild cardiomegaly is noted with central pulmonary vascular congestion. Endotracheal tube is seen projected over tracheal air shadow with distal tip 2 cm above the carina. Nasogastric tube is seen passing through the esophagus into the stomach. Stable bilateral lung opacities are noted, left greater than right, most consistent with pneumonia or possibly edema. No pneumothorax is noted. Probable left pleural effusion cannot be excluded.  Bony thorax is unremarkable. IMPRESSION: Endotracheal and nasogastric tubes are in grossly good position. Stable bilateral lung opacities are noted most consistent with pneumonia or possibly edema. Left pleural effusion cannot be excluded. Electronically Signed   By: Marijo Conception, M.D.   On: 08/09/2017 09:28   Dg Chest Port 1 View  Result Date: 08/08/2017 CLINICAL DATA:  Respiratory failure. EXAM: PORTABLE CHEST 1 VIEW COMPARISON:  August 05, 2017 FINDINGS: There has been progression of airspace consolidation on the left. There is now airspace consolidation throughout most of the left lung. There is a small left pleural effusion. The right lung is clear. There is cardiomegaly with pulmonary venous hypertension. There is aortic atherosclerosis. No adenopathy. No evident bone lesions. IMPRESSION: Consolidation throughout most of the left lung felt to represent widespread pneumonia, progressed from recent study. Right lung clear. Underlying pulmonary vascular congestion. There is aortic atherosclerosis. Aortic Atherosclerosis (ICD10-I70.0). Electronically Signed   By: Lowella Grip III M.D.   On: 08/08/2017 09:27   Dg Chest Port 1v Same Day  Result Date: 08/17/2017 CLINICAL DATA:  Check gastric  catheter placement EXAM: PORTABLE CHEST 1 VIEW COMPARISON:  08/17/2017 FINDINGS: Cardiac shadow is enlarged but stable. Endotracheal tube is noted in satisfactory position. Nasogastric catheter is noted tip within the stomach. Lungs are well aerated with vascular congestion and interstitial edema similar that seen prior exam. IMPRESSION: The overall appearance of the chest is stable from the prior exam. Electronically Signed   By: Inez Catalina M.D.   On: 08/17/2017 21:24   Dg Chest Port 1v Same Day  Result Date: 08/12/2017 CLINICAL DATA:  OG tube replacement. EXAM: PORTABLE CHEST 1 VIEW COMPARISON:  08/12/2017 FINDINGS: An endotracheal tube is identified with 2 cm above the carina. And OG tube enters the  stomach with tip off the field of view. Cardiomegaly, pulmonary vascular congestion, right basilar atelectasis and left lower lung consolidation/atelectasis again noted. There is no evidence of pneumothorax. IMPRESSION: Support apparatus as described. Otherwise unchanged appearance of the chest. Electronically Signed   By: Margarette Canada M.D.   On: 08/12/2017 11:40    Orson Eva, DO  [facility] Pager [phone]  If 7PM-7AM, please contact night-coverage [email or web address] Password [facility] 08/21/2017, 9:34 AM   LOS: 16 days

## 2017-08-21 NOTE — Anesthesia Procedure Notes (Signed)
Procedure Name: Intubation Date/Time: 08/21/2017 10:49 AM Performed by: Vista Deck, CRNA Pre-anesthesia Checklist: Patient identified, Emergency Drugs available, Suction available, Patient being monitored and Timeout performed Patient Re-evaluated:Patient Re-evaluated prior to induction Oxygen Delivery Method: Ambu bag Preoxygenation: Pre-oxygenation with 100% oxygen Induction Type: IV induction Laryngoscope Size: Mac and 3 Grade View: Grade II Tube type: Subglottic suction tube Tube size: 7.5 mm Number of attempts: 1 Airway Equipment and Method: Rigid stylet Placement Confirmation: ETT inserted through vocal cords under direct vision,  CO2 detector and breath sounds checked- equal and bilateral Secured at: 22 cm Tube secured with: ETad. Dental Injury: Teeth and Oropharynx as per pre-operative assessment

## 2017-08-21 NOTE — Consult Note (Signed)
Consultation Note Date: 08/21/2017   Patient Name: [name]  DOB: 09/29/1950  MRN: [id number]  Age / Sex: 67 y.o., female  PCP: Lemmie Evens, MD Referring Physician: Orson Eva, MD  Reason for Consultation: Establishing goals of care and Psychosocial/spiritual support  HPI/Patient Profile: 67 y.o. female  with past medical history of heart failure, high blood pressure and cholesterol, obesity, OSA on CPAP, not using, para sock small atrial fibrillation, pulmonary hypertension, hiatal hernia, GERD, gastroparesis, diabetes, chest pain with a negative cath in 2008, Barrett's esophagus last EGD 2016 admitted on 08/05/2017 with acute respiratory failure with hypoxia and hypercapnia, lobar pneumonia, acute on chronic diastolic heart failure.   Clinical Assessment and Goals of Care: [name] is lying in bed surrounded by her family.  She has BiPAP in place, was extubated earlier this morning.  She has market work of breathing noted, obese abdomen and accessory muscle use.  She is scheduled to be reintubated within the next hour, anesthesia has been called to assist.  Present today at bedside is Husband of 30 years Gogofredo, daughters Ladonna Snide, and daughter Jean/Gina is in the family room calling a son.  We talked about [name] and reintubation.  Husband states "she needs the breathing tube again".  Daughter [name] asks if reintubation today would reset her 14-day limit for oral intubation.  I shared that it does not.  [name] states that she wants her mother made whole again, renewed.  I share that we are doing what we can for her, but we cannot turn back time.  I share that I will continue to work with them, support, answer questions.  Sure that I will return when [name] has been stabilized.  Conference with nursing staff related to care, conference with hospitalist related to care.  Healthcare  power of attorney NEXT OF KIN - Husband of 18 years Gogofredo, daughters Ladonna Snide at bedside.  Husband defers to children for decision making.  Also another daughter Jean/Gina.   SUMMARY OF RECOMMENDATIONS   Continue full scope treatment. Continue discussions related to trach, long-term treatment plan  Code Status/Advance Care Planning:  Full code -husband at bedside stating "she needs the breathing tube again".  Symptom Management:   Per hospitalist, no additional needs at this time.  Palliative Prophylaxis:   Aspiration and Turn Reposition  Additional Recommendations (Limitations, Scope, Preferences):  Full Scope Treatment  Psycho-social/Spiritual:   Desire for further Chaplaincy support:no  Additional Recommendations: Caregiving  Support/Resources  Prognosis:   Unable to determine, based on outcomes.  Discharge Planning: To be determined, based on outcomes.     Primary Diagnoses: Present on Admission: . CAP Failed Outpatient Tx . Atrial fibrillation ([place]) . Dementia . Essential hypertension . Gastroparesis . Hyperlipidemia . Hypothyroidism . Tremor . Sleep apnea . Pulmonary hypertension ([place]) . COPD exacerbation ([place]) . Chronic diastolic CHF (congestive heart failure) ([place])   I have reviewed the medical record, interviewed the patient and family, and examined the patient. The following aspects are pertinent.  Past Medical  History:  Diagnosis Date  . Anemia    H/H of 10/30 with a normal MCV in 12/09  . Anxiety   . Arthritis   . Barrett's esophagus    Diagnosed 1995. Last EGD 2016-NO BARRETT'S.   . Chest pain    Negative cardiac catheterization in 2002; negative stress nuclear study in 2008  . Chronic anticoagulation   . Chronic diastolic CHF (congestive heart failure) (H[facility])    a. EF predominantly normal during prior echoes but was 40% during 10/2014 echo. b. Most recent 01/2015 EF normal, 55-60%.  . Chronic LBP    Surgical intervention in  1996  . Diabetes mellitus, type 2 (HCC)    Insulin therapy; exacerbated by prednisone  . Dysrhythmia    AFib  . Gastroparesis    99% retention 05/2008 on GES  . GERD (gastroesophageal reflux disease)   . Hiatal hernia   . Hyperlipidemia   . Hypertension   . Hypothyroid   . IBS (irritable bowel syndrome)   . Obesity   . OSA on CPAP    had CPAP and cannot tolerate.  . Paroxysmal atrial fibrillation ([place])   . Pulmonary hypertension ([place]) 01/2015   a. Predominantly pulmonary venous hypertension but may be component of PAH.  . Seizures ([place])    last seizure was 2 years ago; on keppra for this; unknown etiology  . Syncope    a. Admitted 05/2009; magnetic resonance imagin/ MRA - negative; etiology thought to be orthostasis secondary to drugs and dehydration. b. Syncope 02/2015 also felt 2/2 dehydration.   Social History   Socioeconomic History  . Marital status: Married    Spouse name: None  . Number of children: None  . Years of education: None  . Highest education level: None  Social Needs  . Financial resource strain: None  . Food insecurity - worry: None  . Food insecurity - inability: None  . Transportation needs - medical: None  . Transportation needs - non-medical: None  Occupational History  . 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 Smoking status: Former Smoker    Packs/day: 0.25    Years: 15.00    Pack years: 3.75    Types: Cigarettes    Start date: 02/26/1966    Last attempt to quit: 07/01/1983    Years since quitting: 34.1  . Smokeless tobacco: Never Used  . Tobacco comment: quit in 1984  Substance and Sexual Activity  . Alcohol use: No    Alcohol/week: 0.0 oz  . Drug use: No  . Sexual activity: Yes    Birth control/protection: None, Surgical  Other Topics Concern  . None  Social History Narrative   Sedentary   4 children, "blended family"   Family History  Problem Relation Age  of Onset  . Hypertension Mother   . Alzheimer's disease Mother   . Stroke Mother   . Heart attack Mother   . Hypertension Other   . Breast cancer Sister   . Heart disease Neg Hx   . Colon cancer Neg Hx    Scheduled Meds: . amiodarone  200 mg Oral q morning - 10a  . apixaban  5 mg Oral BID  . chlorhexidine gluconate (MEDLINE KIT)  15 mL Mouth Rinse BID  . Chlorhexidine Gluconate Cloth  6 each Topical Daily  . dextromethorphan  30 mg Oral BID  . furosemide  40 mg Intravenous BID  .  gabapentin  400 mg Oral 2 times per day  . gabapentin  800 mg Oral QHS  . insulin aspart  0-20 Units Subcutaneous Q4H  . insulin aspart  4 Units Subcutaneous Q4H  . insulin glargine  55 Units Subcutaneous QHS  . ipratropium-albuterol  3 mL Nebulization Q6H  . ketoconazole   Topical BID  . levETIRAcetam  1,000 mg Oral BID  . levothyroxine  75 mcg Intravenous Daily  . magnesium oxide  400 mg Oral Daily  . mouth rinse  15 mL Mouth Rinse QID  . methylPREDNISolone (SOLU-MEDROL) injection  40 mg Intravenous Daily  . multivitamin  15 mL Oral Daily  . nystatin   Topical TID  . pantoprazole (PROTONIX) IV  40 mg Intravenous Q24H  . potassium chloride  40 mEq Oral Daily  . sodium chloride flush  10-40 mL Intracatheter Q12H   Continuous Infusions: . fentaNYL infusion INTRAVENOUS 50 mcg/hr (08/21/17 1114)  . midazolam (VERSED) infusion 3 mg/hr (08/21/17 0658)   PRN Meds:.acetaminophen (TYLENOL) oral liquid 160 mg/5 mL, albuterol, fentaNYL, fentaNYL (SUBLIMAZE) injection, loperamide, midazolam, nitroGLYCERIN, ondansetron (ZOFRAN) IV, sodium chloride flush Medications Prior to Admission:  Prior to Admission medications   Medication Sig Start Date End Date Taking? Authorizing Provider  acetaminophen (TYLENOL) 500 MG tablet Take 500 mg by mouth every 6 (six) hours as needed for moderate pain or headache.   Yes [provider]  albuterol (PROVENTIL HFA;VENTOLIN HFA) 108 (90 Base) MCG/ACT inhaler Inhale 2  puffs into the lungs every 4 (four) hours as needed for wheezing or shortness of breath. 08/19/16  Yes Francine Graven, DO  amiodarone (PACERONE) 200 MG tablet Take 1 tablet (200 mg total) by mouth every morning. 03/24/17  Yes Branch, Alphonse Guild, MD  amoxicillin (AMOXIL) 500 MG capsule Take 1 capsule by mouth 3 (three) times daily. 08/01/17  Yes [provider]  apixaban (ELIQUIS) 5 MG TABS tablet Take 1 tablet (5 mg total) by mouth 2 (two) times daily. 06/09/16  Yes Carole Civil, MD  Cyanocobalamin (B-12) 5000 MCG SUBL Take 1 tablet by mouth daily. 06/20/17  Yes [provider]  dicyclomine (BENTYL) 10 MG capsule TAKE 1 TO 2 CAPSULES BY MOUTH BEFORE meals AS NEEDED 07/10/17  Yes Mahala Menghini, PA-C  DULoxetine (CYMBALTA) 30 MG capsule Take 30 mg by mouth 2 (two) times daily.   Yes [provider]  famotidine (PEPCID) 20 MG tablet Take 20 mg by mouth at bedtime.    Yes [provider]  furosemide (LASIX) 40 MG tablet TAKE 40 MG DAILY. 03/24/17  Yes BranchAlphonse Guild, MD  gabapentin (NEURONTIN) 400 MG capsule Take 400-800 mg by mouth 3 (three) times daily. Take 1 capsule(469m) by mouth twice a day and take 2 capsules(8034m by mouth at bedtime 07/18/17  Yes [provider]  glipiZIDE (GLUCOTROL) 10 MG tablet Take 10 mg by mouth 2 (two) times daily before a meal.    Yes [provider]  Insulin Glargine (LANTUS SOLOSTAR) 100 UNIT/ML Solostar Pen Inject 20-50 Units into the skin at bedtime as needed. Patient takes 20 units every night at bedtime if over 178.  Takes 50 units if her blood sugar is above 300.   Yes [provider]  levETIRAcetam (KEPPRA) 1000 MG tablet Take 1,000 mg by mouth 2 (two) times daily. 07/18/17  Yes [provider]  levothyroxine (SYNTHROID, LEVOTHROID) 137 MCG tablet Take 137 mcg by mouth daily. 10/14/14  Yes [provider]  loperamide (IMODIUM) 2 MG  capsule TAKE ONE CAPSULE BY MOUTH TWICE DAILY  AS NEEDED 05/08/17  Yes Annitta Needs, NP  LORazepam (ATIVAN) 1 MG tablet Take 1 mg by mouth 3 (three) times daily as needed for anxiety or sleep. For anxiety   Yes [provider]  magnesium oxide (MAG-OX) 400 MG tablet Take 400 mg by mouth daily.    Yes [provider]  metaxalone (SKELAXIN) 800 MG tablet Take 800 mg by mouth 3 (three) times daily.  10/04/14  Yes [provider]  metFORMIN (GLUCOPHAGE) 1000 MG tablet Take 1,000 mg by mouth 2 (two) times daily with a meal.    Yes [provider]  Multiple Vitamin (MULTIVITAMIN WITH MINERALS) TABS tablet Take 1 tablet by mouth daily.   Yes [provider]  nitroGLYCERIN (NITROSTAT) 0.4 MG SL tablet Place 0.4 mg under the tongue every 5 (five) minutes as needed for chest pain. Reported on 07/08/2015   Yes [provider]  ondansetron (ZOFRAN) 4 MG tablet TAKE ONE TABLET BY MOUTH FOUR TIMES DAILY FOR NAUSEA Patient taking differently: TAKE ONE TABLET BY MOUTH FOUR TIMES DAILY FOR NAUSEA. Takes as needed 06/08/17  Yes Carlis Stable, NP  pantoprazole (PROTONIX) 40 MG tablet TAKE ONE TABLET BY MOUTH TWICE DAILY BEFORE APPLY MEAL 02/07/17  Yes Annitta Needs, NP  potassium chloride (K-DUR,KLOR-CON) 10 MEQ tablet TAKE TWO TABLETS BY MOUTH TWICE DAILY 04/20/17  Yes Branch, Alphonse Guild, MD  promethazine (PHENERGAN) 12.5 MG tablet Take 1 tablet (12.5 mg total) by mouth every 4 (four) hours as needed for nausea or vomiting. 06/09/16  Yes Carole Civil, MD  UNABLE TO FIND Take 1 tablet by mouth 2 (two) times daily. Medication Name: [employer] RX Multivitamin Diabetes Support 06/20/17  Yes [provider]   Allergies  Allergen Reactions  . Ibuprofen Other (See Comments)    Upsets her stomach and causes abdominal pain  . Codeine Nausea And Vomiting and Other (See Comments)    HALLUCINATIONS  . Dilaudid [Hydromorphone Hcl] Other (See Comments)    Made her pass out  . Celexa [Citalopram Hydrobromide]  Other (See Comments)    Dyskinesia  . Reglan [Metoclopramide] Other (See Comments)    DYSKINESIA   Review of Systems  Unable to perform ROS: Acuity of condition    Physical Exam  Constitutional: She appears ill.  Appears chronically ill, BiPAP in place, respiratory distress  HENT:  Head: Atraumatic.  Cardiovascular: Normal rate and regular rhythm.  Pulmonary/Chest:  BiPAP in place, respiratory distress, planning for elective reintubation within the next hour  Abdominal: Soft. She exhibits distension.  Obese abdomen, accessory muscle use with breathing  Musculoskeletal:       Right lower leg: She exhibits edema.       Left lower leg: She exhibits edema.  Neurological:  Does not respond to voice  Skin: Skin is warm and dry.  Nursing note and vitals reviewed.   Vital Signs: BP (!) 101/51   Pulse (!) 44   Temp 98.2 F (36.8 C) (Axillary)   Resp 15   Ht _0  (1.727 m)   Wt 104.6 kg (230 lb 9.6 oz)   SpO2 93%   BMI 35.06 kg/m  Pain Assessment: CPOT POSS *See Group Information*: S-Acceptable,Sleep, easy to arouse Pain Score: Asleep   SpO2: SpO2: 93 % O2 Device:SpO2: 93 % O2 Flow Rate: .O2 Flow Rate (L/min): 4 L/min  IO: Intake/output summary:   Intake/Output Summary (Last 24 hours) at 08/21/2017 1133 Last data  filed at 08/21/2017 1023 Gross per 24 hour  Intake 10 ml  Output 1800 ml  Net -1790 ml    LBM: Last BM Date: 08/20/17 Baseline Weight: Weight: 102.1 kg (225 lb) Most recent weight: Weight: 104.6 kg (230 lb 9.6 oz)     Palliative Assessment/Data:   Flowsheet Rows     Most Recent Value  Intake Tab  Referral Department  Hospitalist  Unit at Time of Referral  ICU  Palliative Care Primary Diagnosis  Pulmonary  Date Notified  08/19/17  Palliative Care Type  New Palliative care  Reason for referral  Clarify Goals of Care  Date of Admission  08/05/17  Date first seen by Palliative Care  08/21/17  # of days Palliative referral response time  2 Day(s)  #  of days IP prior to Palliative referral  14  Clinical Assessment  Palliative Performance Scale Score  20%  Pain Max last 24 hours  Not able to report  Pain Min Last 24 hours  Not able to report  Dyspnea Max Last 24 Hours  Not able to report  Dyspnea Min Last 24 hours  Not able to report  Psychosocial & Spiritual Assessment  Palliative Care Outcomes  Patient/Family meeting held?  Yes  Who was at the meeting?  Husband of 10 years Gogofredo, daughters Ladonna Snide at bedside  Palliative Care Outcomes  Clarified goals of care, Provided psychosocial or spiritual support      Time In: 0910 Time Out: 0950 Time Total: 40 minutes Greater than 50%  of this time was spent counseling and coordinating care related to the above assessment and plan.  Signed by: Drue Novel, NP   Please contact Palliative Medicine Team phone at [phone] for questions and concerns.  For individual provider: See Shea Evans

## 2017-08-21 NOTE — Progress Notes (Signed)
Patient reintubated at this time due to increased WOB. After extubation, patient became very labored in her breathing. She was initially placed on 4lpm [place] but then inceased to a 100% NRB. Nebulized breathing treatments were given for opening up the airways to no avail. NT suctioning performed as well due to BBS being rhoncus. Moderate thick bloody secretions noted. After some time placed patient on Bipap to see if it would decrease the work of breathing. It did not. Decision was made by attending to reintubate at that time. CRNA intubated patient without incident. Pt was placed back on previous full support settings per Dr. Juanetta Gosling. RT will continue to monitor patient.

## 2017-08-21 NOTE — Anesthesia Procedure Notes (Signed)
Procedures

## 2017-08-21 NOTE — Progress Notes (Incomplete)
4696 Patient extubated, no gag reflex noted. Patient still very sedated. Patient encouraged to cough and move secretions without success. O2 SAT 86% [name] 6L, RT at bedside suctioning patient. Patient O2 SATs did not improve and required to be placed on a non-rebreather mask. Dr.Tat at bedside and ordered to reintubate the patient. Anesthesia notified per Dr.Tat order and [name] came to bedside to intubate patient. Patient reintubated at 1049 successfully.

## 2017-08-21 NOTE — Progress Notes (Signed)
Pt extubated at 0855 to 4lpm [place] per MD order.  Pt still very sleepy from sedation. Orally suctioned without much gag reflex. Neb treatment given. Pt still seems very congested and uncomfortable. RT will NTS if needed and CPT started at this time with the bed module.

## 2017-08-21 NOTE — Progress Notes (Signed)
Responded to nursing/respiratory call:  Hypoxia, tachypnea   Subjective: After speaking with pulmonary, Dr. Juanetta Gosling, the decision was made to extubate the patient this morning.  The patient was extubated ~0855.  Approximately 30-60 minutes after extubation, the patient had difficulty managing her secretions and had increased work of breathing.  The patient was placed on BiPAP with some improvement of her oxygenation and work of breathing.  However, the patient continued to remain encephalopathic with no gag reflex.  In addition, she continued to have low tidal volumes and increased work of breathing.  Vitals:   08/21/17 0954 08/21/17 1241 08/21/17 1310 08/21/17 1317  BP:      Pulse:      Resp:      Temp:  98.6 F (37 C)    TempSrc:  Oral    SpO2: 93%  92% 92%  Weight:      Height:       CV--RRR Lung--bilateral scattered rhonchi. Abd--soft+BS/NT   Assessment/Plan: Acute respiratory failure with hypoxia and hypercarbia -Secondary to CHF in the setting of OHS/OSA and COPD -Patient reintubated approximately 2 hours after extubation on 08/21/2017. -Spoke with PCCM, Dr. [facility]. Pt can have trach and peg there  Total time 35 min.   Catarina Hartshorn, DO [facility]

## 2017-08-21 NOTE — Progress Notes (Signed)
Subjective: She has done well on weaning mode all night.  She is on 12/5 of CPAP.  She looks comfortable.  She has been anxious and moving around during the night and is still on sedation  Objective: Vital signs in last 24 hours: Temp:  [97.4 F (36.3 C)-98.2 F (36.8 C)] 98.1 F (36.7 C) (02/18 0400) Pulse Rate:  [40-72] 44 (02/18 0630) Resp:  [9-22] 15 (02/18 0630) BP: (86-130)/(28-67) 101/51 (02/18 0630) SpO2:  [88 %-99 %] 96 % (02/18 0630) FiO2 (%):  [40 %-50 %] 50 % (02/18 0242) Weight:  [104.6 kg (230 lb 9.6 oz)] 104.6 kg (230 lb 9.6 oz) (02/18 0500) Weight change:  Last BM Date: 08/20/17  Intake/Output from previous day: 02/17 0701 - 02/18 0700 In: 10 [I.V.:10] Out: 1800 [Urine:1400]  PHYSICAL EXAM General appearance: Resting comfortably now.  Still intubated and on the ventilator but on pressure support Resp: clear to auscultation bilaterally Cardio: regular rate and rhythm, S1, S2 normal, no murmur, click, rub or gallop GI: soft, non-tender; bowel sounds normal; no masses,  no organomegaly Extremities: extremities normal, atraumatic, no cyanosis or edema Skin warm and dry  Lab Results:  Results for orders placed or performed during the hospital encounter of 08/05/17 (from the past 48 hour(s))  Glucose, capillary     Status: None   Collection Time: 08/19/17  8:00 AM  Result Value Ref Range   Glucose-Capillary 76 65 - 99 mg/dL   Comment 1 Notify RN   Glucose, capillary     Status: Abnormal   Collection Time: 08/19/17 11:28 AM  Result Value Ref Range   Glucose-Capillary 148 (H) 65 - 99 mg/dL  Glucose, capillary     Status: Abnormal   Collection Time: 08/19/17  4:44 PM  Result Value Ref Range   Glucose-Capillary 231 (H) 65 - 99 mg/dL  Glucose, capillary     Status: Abnormal   Collection Time: 08/19/17  7:36 PM  Result Value Ref Range   Glucose-Capillary 246 (H) 65 - 99 mg/dL   Comment 1 Notify RN   Glucose, capillary     Status: Abnormal   Collection Time:  08/19/17 11:29 PM  Result Value Ref Range   Glucose-Capillary 220 (H) 65 - 99 mg/dL   Comment 1 Notify RN   Glucose, capillary     Status: Abnormal   Collection Time: 08/20/17  4:12 AM  Result Value Ref Range   Glucose-Capillary 229 (H) 65 - 99 mg/dL  Basic metabolic panel     Status: Abnormal   Collection Time: 08/20/17  4:29 AM  Result Value Ref Range   Sodium 146 (H) 135 - 145 mmol/L   Potassium 3.9 3.5 - 5.1 mmol/L   Chloride 103 101 - 111 mmol/L   CO2 34 (H) 22 - 32 mmol/L   Glucose, Bld 244 (H) 65 - 99 mg/dL   BUN 41 (H) 6 - 20 mg/dL   Creatinine, Ser 0.77 0.44 - 1.00 mg/dL   Calcium 8.4 (L) 8.9 - 10.3 mg/dL   GFR calc non Af Amer >60 >60 mL/min   GFR calc Af Amer >60 >60 mL/min    Comment: (NOTE) The eGFR has been calculated using the CKD EPI equation. This calculation has not been validated in all clinical situations. eGFR's persistently <60 mL/min signify possible Chronic Kidney Disease.    Anion gap 9 5 - 15    Comment: Performed at [facility], [address], [place], [place] [address]  Magnesium  Status: None   Collection Time: 08/20/17  4:29 AM  Result Value Ref Range   Magnesium 2.1 1.7 - 2.4 mg/dL    Comment: Performed at [facility], [address] [place], [place] [address]  Phosphorus     Status: None   Collection Time: 08/20/17  4:29 AM  Result Value Ref Range   Phosphorus 3.2 2.5 - 4.6 mg/dL    Comment: Performed at [facility], [address] [place], [place] [address]  Blood gas, arterial     Status: Abnormal   Collection Time: 08/20/17  5:35 AM  Result Value Ref Range   FIO2 40.00    Delivery systems VENTILATOR    Mode PRESSURE REGULATED VOLUME CONTROL    VT 520 mL   LHR 12 resp/min   Peep/cpap 5.0 cm H20   pH, Arterial 7.467 (H) 7.350 - 7.450   pCO2 arterial 50.0 (H) 32.0 - 48.0 mmHg   pO2, Arterial 66.9 (L) 83.0 - 108.0 mmHg   Bicarbonate 34.5 (H) 20.0 - 28.0 mmol/L   Acid-Base Excess 11.3 (H) 0.0 - 2.0 mmol/L   O2 Saturation 92.4 %    Collection site BRACHIAL ARTERY    Drawn by [id number]    Sample type ARTERIAL    Mechanical Rate 12     Comment: Performed at [facility], [address], [place], [place] [address]  Glucose, capillary     Status: Abnormal   Collection Time: 08/20/17  8:10 AM  Result Value Ref Range   Glucose-Capillary 216 (H) 65 - 99 mg/dL  Glucose, capillary     Status: Abnormal   Collection Time: 08/20/17 11:53 AM  Result Value Ref Range   Glucose-Capillary 199 (H) 65 - 99 mg/dL  Glucose, capillary     Status: Abnormal   Collection Time: 08/20/17  4:35 PM  Result Value Ref Range   Glucose-Capillary 208 (H) 65 - 99 mg/dL  Glucose, capillary     Status: Abnormal   Collection Time: 08/20/17  8:04 PM  Result Value Ref Range   Glucose-Capillary 190 (H) 65 - 99 mg/dL   Comment 1 Notify RN   Glucose, capillary     Status: Abnormal   Collection Time: 08/21/17 12:11 AM  Result Value Ref Range   Glucose-Capillary 211 (H) 65 - 99 mg/dL   Comment 1 Notify RN   Glucose, capillary     Status: Abnormal   Collection Time: 08/21/17  3:51 AM  Result Value Ref Range   Glucose-Capillary 186 (H) 65 - 99 mg/dL   Comment 1 Notify RN   Blood gas, arterial     Status: Abnormal   Collection Time: 08/21/17  6:15 AM  Result Value Ref Range   FIO2 45.00    Delivery systems VENTILATOR    Mode PRESSURE SUPPORT    Peep/cpap 5.0 cm H20   Pressure support 12 cm H20   pH, Arterial 7.430 7.350 - 7.450   pCO2 arterial 55.5 (H) 32.0 - 48.0 mmHg   pO2, Arterial 87.8 83.0 - 108.0 mmHg   Bicarbonate 34.4 (H) 20.0 - 28.0 mmol/L   Acid-Base Excess 11.4 (H) 0.0 - 2.0 mmol/L   O2 Saturation 96.3 %   Collection site RADIAL    Drawn by [id number]    Sample type ARTERIAL    Allens test (pass/fail) PASS PASS    Comment: Performed at [facility], [address] [place], [place] [address]    ABGS Recent Labs    08/21/17 0615  PHART 7.430  PO2ART 87.8  HCO3 34.4*   CULTURES Recent Results (from the past 240 hour(s))   Culture, Urine     Status: Abnormal   Collection Time: 08/17/17  4:00 PM  Result Value Ref Range Status   Specimen Description   Final    URINE, RANDOM Performed at [facility], [address] [place], [place] [address]    Special Requests   Final    NONE Performed at [facility], [address], [place], [place] [address]    Culture 50,000 COLONIES/mL YEAST (A)  Final   Report Status 08/19/2017 FINAL  Final   Studies/Results: No results found.  Medications:  Prior to Admission:  Medications Prior to Admission  Medication Sig Dispense Refill Last Dose  . acetaminophen (TYLENOL) 500 MG tablet Take 500 mg by mouth every 6 (six) hours as needed for moderate pain or headache.   08/05/2017 at 0630  . albuterol (PROVENTIL HFA;VENTOLIN HFA) 108 (90 Base) MCG/ACT inhaler Inhale 2 puffs into the lungs every 4 (four) hours as needed for wheezing or shortness of breath. 1 Inhaler 0 08/04/2017 at Unknown time  . amiodarone (PACERONE) 200 MG tablet Take 1 tablet (200 mg total) by mouth every morning. 90 tablet 3 08/05/2017 at Unknown time  . amoxicillin (AMOXIL) 500 MG capsule Take 1 capsule by mouth 3 (three) times daily.  0 08/05/2017 at 0700  . apixaban (ELIQUIS) 5 MG TABS tablet Take 1 tablet (5 mg total) by mouth 2 (two) times daily. 60 tablet 0 08/05/2017 at Unknown time  . Cyanocobalamin (B-12) 5000 MCG SUBL Take 1 tablet by mouth daily.  12 08/05/2017 at 0700  . dicyclomine (BENTYL) 10 MG capsule TAKE 1 TO 2 CAPSULES BY MOUTH BEFORE meals AS NEEDED 180 capsule 3 08/04/2017 at Unknown time  . DULoxetine (CYMBALTA) 30 MG capsule Take 30 mg by mouth 2 (two) times daily.   08/05/2017 at 0700  . famotidine (PEPCID) 20 MG tablet Take 20 mg by mouth at bedtime.    08/04/2017 at Unknown time  . furosemide (LASIX) 40 MG tablet TAKE 40 MG DAILY. 90 tablet 3 08/05/2017 at Unknown time  . gabapentin (NEURONTIN) 400 MG capsule Take 400-800 mg by mouth 3 (three) times daily. Take 1 capsule(471m) by mouth twice a day and  take 2 capsules(803m by mouth at bedtime  11 08/05/2017 at 0700  . glipiZIDE (GLUCOTROL) 10 MG tablet Take 10 mg by mouth 2 (two) times daily before a meal.    08/05/2017 at Unknown time  . Insulin Glargine (LANTUS SOLOSTAR) 100 UNIT/ML Solostar Pen Inject 20-50 Units into the skin at bedtime as needed. Patient takes 20 units every night at bedtime if over 178.  Takes 50 units if her blood sugar is above 300.   08/04/2017 at 2200  . levETIRAcetam (KEPPRA) 1000 MG tablet Take 1,000 mg by mouth 2 (two) times daily.  1 08/05/2017 at 0700  . levothyroxine (SYNTHROID, LEVOTHROID) 137 MCG tablet Take 137 mcg by mouth daily.   08/05/2017 at 0700  . loperamide (IMODIUM) 2 MG capsule TAKE ONE CAPSULE BY MOUTH TWICE DAILY AS NEEDED 60 capsule 3 08/04/2017 at Unknown time  . LORazepam (ATIVAN) 1 MG tablet Take 1 mg by mouth 3 (three) times daily as needed for anxiety or sleep. For anxiety   08/04/2017 at 2200  . magnesium oxide (MAG-OX) 400 MG tablet Take 400 mg by mouth daily.    08/05/2017 at Unknown time  . metaxalone (SKELAXIN) 800 MG tablet Take 800 mg  by mouth 3 (three) times daily.    08/05/2017 at 0700  . metFORMIN (GLUCOPHAGE) 1000 MG tablet Take 1,000 mg by mouth 2 (two) times daily with a meal.    08/05/2017 at 0700  . Multiple Vitamin (MULTIVITAMIN WITH MINERALS) TABS tablet Take 1 tablet by mouth daily.   08/05/2017 at 0700  . nitroGLYCERIN (NITROSTAT) 0.4 MG SL tablet Place 0.4 mg under the tongue every 5 (five) minutes as needed for chest pain. Reported on 07/08/2015   Taking  . ondansetron (ZOFRAN) 4 MG tablet TAKE ONE TABLET BY MOUTH FOUR TIMES DAILY FOR NAUSEA (Patient taking differently: TAKE ONE TABLET BY MOUTH FOUR TIMES DAILY FOR NAUSEA. Takes as needed) 120 tablet 3 Past Week at Unknown time  . pantoprazole (PROTONIX) 40 MG tablet TAKE ONE TABLET BY MOUTH TWICE DAILY BEFORE APPLY MEAL 180 tablet 3 08/05/2017 at 0700  . potassium chloride (K-DUR,KLOR-CON) 10 MEQ tablet TAKE TWO TABLETS BY MOUTH TWICE DAILY 120  tablet 11 08/05/2017 at 0700  . promethazine (PHENERGAN) 12.5 MG tablet Take 1 tablet (12.5 mg total) by mouth every 4 (four) hours as needed for nausea or vomiting. 42 tablet 0 Taking  . UNABLE TO FIND Take 1 tablet by mouth 2 (two) times daily. Medication Name: [employer] RX Multivitamin Diabetes Support  12 08/05/2017 at 0700   Scheduled: . amiodarone  200 mg Oral q morning - 10a  . apixaban  5 mg Oral BID  . chlorhexidine gluconate (MEDLINE KIT)  15 mL Mouth Rinse BID  . Chlorhexidine Gluconate Cloth  6 each Topical Daily  . dextromethorphan  30 mg Oral BID  . furosemide  40 mg Intravenous BID  . gabapentin  400 mg Oral 2 times per day  . gabapentin  800 mg Oral QHS  . insulin aspart  0-20 Units Subcutaneous Q4H  . insulin aspart  4 Units Subcutaneous Q4H  . insulin glargine  55 Units Subcutaneous QHS  . ipratropium-albuterol  3 mL Nebulization Q6H  . ketoconazole   Topical BID  . levETIRAcetam  1,000 mg Oral BID  . levothyroxine  75 mcg Intravenous Daily  . magnesium oxide  400 mg Oral Daily  . mouth rinse  15 mL Mouth Rinse QID  . methylPREDNISolone (SOLU-MEDROL) injection  40 mg Intravenous Daily  . multivitamin  15 mL Oral Daily  . nystatin   Topical TID  . pantoprazole (PROTONIX) IV  40 mg Intravenous Q24H  . potassium chloride  40 mEq Oral Daily  . sodium chloride flush  10-40 mL Intracatheter Q12H   Continuous: . fentaNYL infusion INTRAVENOUS 150 mcg/hr (08/21/17 0658)  . midazolam (VERSED) infusion 3 mg/hr (08/21/17 8938)   BOF:BPZWCHENIDPOE (TYLENOL) oral liquid 160 mg/5 mL, albuterol, fentaNYL, fentaNYL (SUBLIMAZE) injection, loperamide, midazolam, nitroGLYCERIN, ondansetron (ZOFRAN) IV, sodium chloride flush  Assesment: She was admitted with community-acquired pneumonia and had failed outpatient treatment.  She was being treated with intravenous antibiotics and had required BiPAP.  However she continued to have difficulty maintaining her oxygen saturation had a near  "white out" of her left lung and eventually needed intubation and mechanical ventilation.  She still has hypoxic and hypercapnic respiratory failure but she has been on weaning mode for most of the day yesterday and last night.  She has COPD exacerbation  She has history of heart failure and she has diuresed.  She has cognitive impairment/dementia which has complicated her situation  She has atrial fib but currently is in sinus bradycardia  She is chronically anticoagulated  She has sleep apnea at baseline which may complicate coming off the ventilator  We are approaching the need for tracheostomy.  Since she has done as well as she has on for the last 24 hours we will plan for extubation later today  Discontinue tube feeding since she is having trouble maintaining an upright position and she may aspirate Principal Problem:   CAP Failed Outpatient Tx Active Problems:   Hypothyroidism   Hyperlipidemia   Essential hypertension   Atrial fibrillation (HCC)   Congestive heart failure (HCC)   Gastroparesis   Sleep apnea   Chronic anticoagulation   Tremor   Dementia   DM type 2 (diabetes mellitus, type 2) (HCC)   Chronic diastolic CHF (congestive heart failure) (HCC)   Pulmonary hypertension (HCC)   COPD exacerbation (HCC)   Acute respiratory failure with hypercapnia ([place])   Acute respiratory failure with hypoxia and hypercapnia ([place])   Uncontrolled type 2 diabetes mellitus with hyperglycemia (HCC)   Acute on chronic diastolic CHF (congestive heart failure) ([place])    Plan: As above    LOS: 16 days   Anairis Knick L 08/21/2017, 6:57 AM

## 2017-08-22 ENCOUNTER — Other Ambulatory Visit (HOSPITAL_COMMUNITY): Payer: Self-pay

## 2017-08-22 ENCOUNTER — Inpatient Hospital Stay
Admission: RE | Admit: 2017-08-22 | Discharge: 2017-09-01 | Disposition: A | Discharge status: Nursing Home (Includes ICF) | Payer: Self-pay | Source: Other Acute Inpatient Hospital | Attending: Internal Medicine | Admitting: Internal Medicine

## 2017-08-22 DIAGNOSIS — Z4659 Encounter for fitting and adjustment of other gastrointestinal appliance and device: Secondary | ICD-10-CM

## 2017-08-22 DIAGNOSIS — J9690 Respiratory failure, unspecified, unspecified whether with hypoxia or hypercapnia: Secondary | ICD-10-CM

## 2017-08-22 DIAGNOSIS — R509 Fever, unspecified: Secondary | ICD-10-CM

## 2017-08-22 DIAGNOSIS — K567 Ileus, unspecified: Secondary | ICD-10-CM

## 2017-08-22 LAB — BLOOD GAS, ARTERIAL
Acid-Base Excess: 13.3 mmol/L — ABNORMAL HIGH (ref 0.0–2.0)
Acid-Base Excess: 9.3 mmol/L — ABNORMAL HIGH (ref 0.0–2.0)
Bicarbonate: 36.6 mmol/L — ABNORMAL HIGH (ref 20.0–28.0)
Bicarbonate: 33.4 mmol/L — ABNORMAL HIGH (ref 20.0–28.0)
Drawn by: 382351
FIO2: 60
FIO2: 60
LHR: 12 {breaths}/min
MECHVT: 520 mL
O2 SAT: 94 %
O2 SAT: 98.2 %
PATIENT TEMPERATURE: 37
PATIENT TEMPERATURE: 98.6
PCO2 ART: 44.7 mmHg (ref 32.0–48.0)
PCO2 ART: 45.9 mmHg (ref 32.0–48.0)
PEEP/CPAP: 5 cmH2O
PH ART: 7.531 — AB (ref 7.350–7.450)
PO2 ART: 124 mmHg — AB (ref 83.0–108.0)
RATE: 12 resp/min
VT: 520 mL
pH, Arterial: 7.475 — ABNORMAL HIGH (ref 7.350–7.450)
pO2, Arterial: 68 mmHg — ABNORMAL LOW (ref 83.0–108.0)

## 2017-08-22 LAB — GLUCOSE, CAPILLARY
GLUCOSE-CAPILLARY: 114 mg/dL — AB (ref 65–99)
Glucose-Capillary: 52 mg/dL — ABNORMAL LOW (ref 65–99)
Glucose-Capillary: 78 mg/dL (ref 65–99)
Glucose-Capillary: 264 mg/dL — ABNORMAL HIGH (ref 65–99)

## 2017-08-22 LAB — CBC
HCT: 31.7 % — ABNORMAL LOW (ref 36.0–46.0)
Hemoglobin: 9.4 g/dL — ABNORMAL LOW (ref 12.0–15.0)
MCH: 31 pg (ref 26.0–34.0)
MCHC: 29.7 g/dL — ABNORMAL LOW (ref 30.0–36.0)
MCV: 104.6 fL — ABNORMAL HIGH (ref 78.0–100.0)
Platelets: 202 K/uL (ref 150–400)
RBC: 3.03 MIL/uL — ABNORMAL LOW (ref 3.87–5.11)
RDW: 15.1 % (ref 11.5–15.5)
WBC: 11.9 K/uL — ABNORMAL HIGH (ref 4.0–10.5)

## 2017-08-22 LAB — BASIC METABOLIC PANEL WITH GFR
Anion gap: 10 (ref 5–15)
BUN: 36 mg/dL — ABNORMAL HIGH (ref 6–20)
CO2: 35 mmol/L — ABNORMAL HIGH (ref 22–32)
Calcium: 8.8 mg/dL — ABNORMAL LOW (ref 8.9–10.3)
Chloride: 103 mmol/L (ref 101–111)
Creatinine, Ser: 0.87 mg/dL (ref 0.44–1.00)
GFR calc Af Amer: >60 mL/min
GFR calc non Af Amer: >60 mL/min
Glucose, Bld: 113 mg/dL — ABNORMAL HIGH (ref 65–99)
Potassium: 3.7 mmol/L (ref 3.5–5.1)
Sodium: 148 mmol/L — ABNORMAL HIGH (ref 135–145)

## 2017-08-22 LAB — MAGNESIUM: MAGNESIUM: 2 mg/dL (ref 1.7–2.4)

## 2017-08-22 MED ORDER — FENTANYL CITRATE (PF) 2500 MCG/50ML IJ SOLN
INTRAMUSCULAR | Status: AC
Start: 2017-08-22 — End: 2017-08-22
  Filled 2017-08-22: qty 50

## 2017-08-22 MED ORDER — INSULIN ASPART 100 UNIT/ML ~~LOC~~ SOLN: 0.0000 [IU] | SUBCUTANEOUS | 0 refills | Status: DC

## 2017-08-22 MED ORDER — INSULIN GLARGINE 100 UNIT/ML ~~LOC~~ SOLN: 55.0000 [IU] | Freq: Every day | SUBCUTANEOUS | 0 refills | Status: DC

## 2017-08-22 MED ORDER — FUROSEMIDE 40 MG PO TABS: 40.0000 mg | ORAL_TABLET | Freq: Every day | ORAL | Status: DC

## 2017-08-22 MED ORDER — MIDAZOLAM 50MG/50ML (1MG/ML) PREMIX INFUSION
INTRAVENOUS | Status: AC
  Filled 2017-08-22: qty 50

## 2017-08-22 MED ORDER — VITAL HIGH PROTEIN PO LIQD: ORAL | 0 refills | Status: DC

## 2017-08-22 MED ORDER — IPRATROPIUM-ALBUTEROL 0.5-2.5 (3) MG/3ML IN SOLN: 3.0000 mL | Freq: Four times a day (QID) | RESPIRATORY_TRACT | 0 refills | Status: DC

## 2017-08-22 MED ORDER — SODIUM CHLORIDE 0.9 % IV SOLN: INTRAVENOUS | Status: DC

## 2017-08-22 NOTE — Progress Notes (Signed)
Subjective: She was extubated yesterday but almost immediately required reintubation.  She is going to need tracheostomy and PEG tube placement and her daughter [facility]  Objective: Vital signs in last 24 hours: Temp:  [97.3 F (36.3 C)-98.9 F (37.2 C)] 97.8 F (36.6 C) (02/19 0400) Pulse Rate:  [43-98] 44 (02/19 0730) Resp:  [12-24] 15 (02/19 0730) BP: (81-148)/(34-90) 92/52 (02/19 0730) SpO2:  [90 %-100 %] 96 % (02/19 0730) FiO2 (%):  [50 %-60 %] 60 % (02/19 0500) Weight:  [104.6 kg (230 lb 9.6 oz)] 104.6 kg (230 lb 9.6 oz) (02/19 0500) Weight change: 0 kg (0 lb) Last BM Date: 08/21/17  Intake/Output from previous day: 02/18 0701 - 02/19 0700 In: 10 [I.V.:10] Out: 3050 [Urine:3050]  PHYSICAL EXAM General appearance: Intubated sedated on mechanical ventilation Resp: rhonchi bilaterally Cardio: Sinus bradycardia with rate in the 40s GI: soft, non-tender; bowel sounds normal; no masses,  no organomegaly Extremities: extremities normal, atraumatic, no cyanosis or edema Skin turgor fair  Lab Results:  Results for orders placed or performed during the hospital encounter of 08/05/17 (from the past 48 hour(s))  Glucose, capillary     Status: Abnormal   Collection Time: 08/20/17  8:10 AM  Result Value Ref Range   Glucose-Capillary 216 (H) 65 - 99 mg/dL  Glucose, capillary     Status: Abnormal   Collection Time: 08/20/17 11:53 AM  Result Value Ref Range   Glucose-Capillary 199 (H) 65 - 99 mg/dL  Glucose, capillary     Status: Abnormal   Collection Time: 08/20/17  4:35 PM  Result Value Ref Range   Glucose-Capillary 208 (H) 65 - 99 mg/dL  Glucose, capillary     Status: Abnormal   Collection Time: 08/20/17  8:04 PM  Result Value Ref Range   Glucose-Capillary 190 (H) 65 - 99 mg/dL   Comment 1 Notify RN   Glucose, capillary     Status: Abnormal   Collection Time: 08/21/17 12:11 AM  Result Value Ref Range   Glucose-Capillary  211 (H) 65 - 99 mg/dL   Comment 1 Notify RN   Glucose, capillary     Status: Abnormal   Collection Time: 08/21/17  3:51 AM  Result Value Ref Range   Glucose-Capillary 186 (H) 65 - 99 mg/dL   Comment 1 Notify RN   Basic metabolic panel     Status: Abnormal   Collection Time: 08/21/17  5:12 AM  Result Value Ref Range   Sodium 149 (H) 135 - 145 mmol/L   Potassium 3.9 3.5 - 5.1 mmol/L   Chloride 104 101 - 111 mmol/L   CO2 35 (H) 22 - 32 mmol/L   Glucose, Bld 140 (H) 65 - 99 mg/dL   BUN 43 (H) 6 - 20 mg/dL   Creatinine, Ser 0.79 0.44 - 1.00 mg/dL   Calcium 8.7 (L) 8.9 - 10.3 mg/dL   GFR calc non Af Amer >60 >60 mL/min   GFR calc Af Amer >60 >60 mL/min    Comment: (NOTE) The eGFR has been calculated using the CKD EPI equation. This calculation has not been validated in all clinical situations. eGFR's persistently <60 mL/min signify possible Chronic Kidney Disease.    Anion gap 10 5 - 15    Comment: Performed at [facility], [address], [place], [place] [address]  CBC     Status: Abnormal   Collection Time: 08/21/17  5:12 AM  Result Value Ref Range  WBC 12.1 (H) 4.0 - 10.5 K/uL   RBC 3.08 (L) 3.87 - 5.11 MIL/uL   Hemoglobin 9.5 (L) 12.0 - 15.0 g/dL   HCT 32.6 (L) 36.0 - 46.0 %   MCV 105.8 (H) 78.0 - 100.0 fL   MCH 30.8 26.0 - 34.0 pg   MCHC 29.1 (L) 30.0 - 36.0 g/dL   RDW 15.1 11.5 - 15.5 %   Platelets 212 150 - 400 K/uL    Comment: Performed at [facility], [address], [place], [place] [address]  Brain natriuretic peptide     Status: Abnormal   Collection Time: 08/21/17  5:12 AM  Result Value Ref Range   B Natriuretic Peptide 333.0 (H) 0.0 - 100.0 pg/mL    Comment: Performed at [facility], [address], [place], [place] [address]  Triglycerides     Status: None   Collection Time: 08/21/17  5:12 AM  Result Value Ref Range   Triglycerides 81 <150 mg/dL    Comment: Performed at [facility], [address], [place], [place] [address]  Blood gas, arterial      Status: Abnormal   Collection Time: 08/21/17  6:15 AM  Result Value Ref Range   FIO2 45.00    Delivery systems VENTILATOR    Mode PRESSURE SUPPORT    Peep/cpap 5.0 cm H20   Pressure support 12 cm H20   pH, Arterial 7.430 7.350 - 7.450   pCO2 arterial 55.5 (H) 32.0 - 48.0 mmHg   pO2, Arterial 87.8 83.0 - 108.0 mmHg   Bicarbonate 34.4 (H) 20.0 - 28.0 mmol/L   Acid-Base Excess 11.4 (H) 0.0 - 2.0 mmol/L   O2 Saturation 96.3 %   Collection site RADIAL    Drawn by [id number]    Sample type ARTERIAL    Allens test (pass/fail) PASS PASS    Comment: Performed at [facility], [address] [place], [place] [address]  Glucose, capillary     Status: None   Collection Time: 08/21/17  8:37 AM  Result Value Ref Range   Glucose-Capillary 96 65 - 99 mg/dL  Glucose, capillary     Status: Abnormal   Collection Time: 08/21/17 11:53 AM  Result Value Ref Range   Glucose-Capillary 185 (H) 65 - 99 mg/dL   Comment 1 Notify RN    Comment 2 Document in Chart   Glucose, capillary     Status: Abnormal   Collection Time: 08/21/17  4:35 PM  Result Value Ref Range   Glucose-Capillary 161 (H) 65 - 99 mg/dL  Glucose, capillary     Status: Abnormal   Collection Time: 08/21/17  8:24 PM  Result Value Ref Range   Glucose-Capillary 168 (H) 65 - 99 mg/dL  Glucose, capillary     Status: Abnormal   Collection Time: 08/21/17 11:34 PM  Result Value Ref Range   Glucose-Capillary 193 (H) 65 - 99 mg/dL  Blood gas, arterial     Status: Abnormal   Collection Time: 08/22/17  3:54 AM  Result Value Ref Range   FIO2 60.00    Delivery systems VENTILATOR    Mode PRESSURE REGULATED VOLUME CONTROL    VT 520 mL   LHR 12 resp/min   Peep/cpap 5.0 cm H20   pH, Arterial 7.531 (H) 7.350 - 7.450   pCO2 arterial 44.7 32.0 - 48.0 mmHg   pO2, Arterial 68.0 (L) 83.0 - 108.0 mmHg   Bicarbonate 36.6 (H) 20.0 - 28.0 mmol/L   Acid-Base Excess 13.3 (H) 0.0 - 2.0 mmol/L  O2 Saturation 94.0 %   Patient temperature 37.0    Collection  site RIGHT RADIAL    Drawn by [id number]    Sample type ARTERIAL DRAW    Allens test (pass/fail) PASS PASS    Comment: Performed at [facility], [address], [place], [place] [address]  Glucose, capillary     Status: Abnormal   Collection Time: 08/22/17  3:56 AM  Result Value Ref Range   Glucose-Capillary 114 (H) 65 - 99 mg/dL  Basic metabolic panel     Status: Abnormal   Collection Time: 08/22/17  4:07 AM  Result Value Ref Range   Sodium 148 (H) 135 - 145 mmol/L   Potassium 3.7 3.5 - 5.1 mmol/L   Chloride 103 101 - 111 mmol/L   CO2 35 (H) 22 - 32 mmol/L   Glucose, Bld 113 (H) 65 - 99 mg/dL   BUN 36 (H) 6 - 20 mg/dL   Creatinine, Ser 0.87 0.44 - 1.00 mg/dL   Calcium 8.8 (L) 8.9 - 10.3 mg/dL   GFR calc non Af Amer >60 >60 mL/min   GFR calc Af Amer >60 >60 mL/min    Comment: (NOTE) The eGFR has been calculated using the CKD EPI equation. This calculation has not been validated in all clinical situations. eGFR's persistently <60 mL/min signify possible Chronic Kidney Disease.    Anion gap 10 5 - 15    Comment: Performed at [facility], [address], [place], [place] [address]  CBC     Status: Abnormal   Collection Time: 08/22/17  4:07 AM  Result Value Ref Range   WBC 11.9 (H) 4.0 - 10.5 K/uL   RBC 3.03 (L) 3.87 - 5.11 MIL/uL   Hemoglobin 9.4 (L) 12.0 - 15.0 g/dL   HCT 31.7 (L) 36.0 - 46.0 %   MCV 104.6 (H) 78.0 - 100.0 fL   MCH 31.0 26.0 - 34.0 pg   MCHC 29.7 (L) 30.0 - 36.0 g/dL   RDW 15.1 11.5 - 15.5 %   Platelets 202 150 - 400 K/uL    Comment: Performed at [facility], [address] [place], [place] [address]  Magnesium     Status: None   Collection Time: 08/22/17  4:07 AM  Result Value Ref Range   Magnesium 2.0 1.7 - 2.4 mg/dL    Comment: Performed at [facility], [address], [place], [place] [address]    ABGS Recent Labs    08/22/17 0354  PHART 7.531*  PO2ART 68.0*  HCO3 36.6*   CULTURES Recent Results (from the past 240 hour(s))  Culture, Urine      Status: Abnormal   Collection Time: 08/17/17  4:00 PM  Result Value Ref Range Status   Specimen Description   Final    URINE, RANDOM Performed at [facility], [address], [place], [place] [address]    Special Requests   Final    NONE Performed at [facility], [address] [place],  [address]    Culture 50,000 COLONIES/mL YEAST (A)  Final   Report Status 08/19/2017 FINAL  Final   Studies/Results: Dg Chest Port 1 View  Result Date: 08/21/2017 CLINICAL DATA:  Respiratory failure EXAM: PORTABLE CHEST 1 VIEW COMPARISON:  08/19/2017 FINDINGS: Endotracheal tube in good position.  NG tube enters the stomach. Left lower lobe airspace disease unchanged. Right lung remains clear. Cardiac enlargement without heart failure. IMPRESSION: Endotracheal tube remains in good position. Left lower lobe atelectasis/infiltrate unchanged. Electronically Signed   By:  Franchot Gallo M.D.   On: 08/21/2017 08:19   Dg Chest Port 1v Same Day  Result Date: 08/21/2017 CLINICAL DATA:  Orogastric placement. EXAM: PORTABLE CHEST 1 VIEW COMPARISON:  Earlier same day FINDINGS: Endotracheal tube tip is 2 cm above the carina. Orogastric tube enters the stomach with its tip at least in the mid body of the stomach. Left arm PICC tip is at the SVC innominate junction. Cardiomegaly persists with mild edema, and volume loss in the left lower lobe IMPRESSION: Left arm PICC tip at the SVC innominate junction. Endotracheal tube and nasogastric tube well positioned. Persistent mild edema and left lower lobe volume loss. Electronically Signed   By: Nelson Chimes M.D.   On: 08/21/2017 16:39    Medications:  Prior to Admission:  Medications Prior to Admission  Medication Sig Dispense Refill Last Dose  . acetaminophen (TYLENOL) 500 MG tablet Take 500 mg by mouth every 6 (six) hours as needed for moderate pain or headache.   08/05/2017 at 0630  . albuterol (PROVENTIL HFA;VENTOLIN HFA) 108 (90 Base) MCG/ACT inhaler Inhale 2  puffs into the lungs every 4 (four) hours as needed for wheezing or shortness of breath. 1 Inhaler 0 08/04/2017 at Unknown time  . amiodarone (PACERONE) 200 MG tablet Take 1 tablet (200 mg total) by mouth every morning. 90 tablet 3 08/05/2017 at Unknown time  . amoxicillin (AMOXIL) 500 MG capsule Take 1 capsule by mouth 3 (three) times daily.  0 08/05/2017 at 0700  . apixaban (ELIQUIS) 5 MG TABS tablet Take 1 tablet (5 mg total) by mouth 2 (two) times daily. 60 tablet 0 08/05/2017 at Unknown time  . Cyanocobalamin (B-12) 5000 MCG SUBL Take 1 tablet by mouth daily.  12 08/05/2017 at 0700  . dicyclomine (BENTYL) 10 MG capsule TAKE 1 TO 2 CAPSULES BY MOUTH BEFORE meals AS NEEDED 180 capsule 3 08/04/2017 at Unknown time  . DULoxetine (CYMBALTA) 30 MG capsule Take 30 mg by mouth 2 (two) times daily.   08/05/2017 at 0700  . famotidine (PEPCID) 20 MG tablet Take 20 mg by mouth at bedtime.    08/04/2017 at Unknown time  . furosemide (LASIX) 40 MG tablet TAKE 40 MG DAILY. 90 tablet 3 08/05/2017 at Unknown time  . gabapentin (NEURONTIN) 400 MG capsule Take 400-800 mg by mouth 3 (three) times daily. Take 1 capsule(471m) by mouth twice a day and take 2 capsules(8065m by mouth at bedtime  11 08/05/2017 at 0700  . glipiZIDE (GLUCOTROL) 10 MG tablet Take 10 mg by mouth 2 (two) times daily before a meal.    08/05/2017 at Unknown time  . Insulin Glargine (LANTUS SOLOSTAR) 100 UNIT/ML Solostar Pen Inject 20-50 Units into the skin at bedtime as needed. Patient takes 20 units every night at bedtime if over 178.  Takes 50 units if her blood sugar is above 300.   08/04/2017 at 2200  . levETIRAcetam (KEPPRA) 1000 MG tablet Take 1,000 mg by mouth 2 (two) times daily.  1 08/05/2017 at 0700  . levothyroxine (SYNTHROID, LEVOTHROID) 137 MCG tablet Take 137 mcg by mouth daily.   08/05/2017 at 0700  . loperamide (IMODIUM) 2 MG capsule TAKE ONE CAPSULE BY MOUTH TWICE DAILY AS NEEDED 60 capsule 3 08/04/2017 at Unknown time  . LORazepam (ATIVAN) 1 MG tablet  Take 1 mg by mouth 3 (three) times daily as needed for anxiety or sleep. For anxiety   08/04/2017 at 2200  . magnesium oxide (MAG-OX) 400 MG tablet Take 400 mg  by mouth daily.    08/05/2017 at Unknown time  . metaxalone (SKELAXIN) 800 MG tablet Take 800 mg by mouth 3 (three) times daily.    08/05/2017 at 0700  . metFORMIN (GLUCOPHAGE) 1000 MG tablet Take 1,000 mg by mouth 2 (two) times daily with a meal.    08/05/2017 at 0700  . Multiple Vitamin (MULTIVITAMIN WITH MINERALS) TABS tablet Take 1 tablet by mouth daily.   08/05/2017 at 0700  . nitroGLYCERIN (NITROSTAT) 0.4 MG SL tablet Place 0.4 mg under the tongue every 5 (five) minutes as needed for chest pain. Reported on 07/08/2015   Taking  . ondansetron (ZOFRAN) 4 MG tablet TAKE ONE TABLET BY MOUTH FOUR TIMES DAILY FOR NAUSEA (Patient taking differently: TAKE ONE TABLET BY MOUTH FOUR TIMES DAILY FOR NAUSEA. Takes as needed) 120 tablet 3 Past Week at Unknown time  . pantoprazole (PROTONIX) 40 MG tablet TAKE ONE TABLET BY MOUTH TWICE DAILY BEFORE APPLY MEAL 180 tablet 3 08/05/2017 at 0700  . potassium chloride (K-DUR,KLOR-CON) 10 MEQ tablet TAKE TWO TABLETS BY MOUTH TWICE DAILY 120 tablet 11 08/05/2017 at 0700  . promethazine (PHENERGAN) 12.5 MG tablet Take 1 tablet (12.5 mg total) by mouth every 4 (four) hours as needed for nausea or vomiting. 42 tablet 0 Taking  . UNABLE TO FIND Take 1 tablet by mouth 2 (two) times daily. Medication Name: [employer] RX Multivitamin Diabetes Support  12 08/05/2017 at 0700   Scheduled: . amiodarone  200 mg Oral q morning - 10a  . apixaban  5 mg Oral BID  . chlorhexidine gluconate (MEDLINE KIT)  15 mL Mouth Rinse BID  . Chlorhexidine Gluconate Cloth  6 each Topical Daily  . dextromethorphan  30 mg Oral BID  . furosemide  40 mg Intravenous BID  . gabapentin  400 mg Oral 2 times per day  . gabapentin  800 mg Oral QHS  . insulin aspart  0-20 Units Subcutaneous Q4H  . insulin aspart  4 Units Subcutaneous Q4H  . insulin glargine  55  Units Subcutaneous QHS  . ipratropium-albuterol  3 mL Nebulization Q6H  . ketoconazole   Topical BID  . levETIRAcetam  1,000 mg Oral BID  . levothyroxine  75 mcg Intravenous Daily  . magnesium oxide  400 mg Oral Daily  . mouth rinse  15 mL Mouth Rinse QID  . methylPREDNISolone (SOLU-MEDROL) injection  40 mg Intravenous Daily  . multivitamin  15 mL Oral Daily  . nystatin   Topical TID  . pantoprazole (PROTONIX) IV  40 mg Intravenous Q24H  . potassium chloride  40 mEq Oral Daily  . sodium chloride flush  10-40 mL Intracatheter Q12H   Continuous: . feeding supplement (VITAL HIGH PROTEIN) 1,000 mL (08/21/17 2300)  . fentaNYL infusion INTRAVENOUS 275 mcg/hr (08/22/17 0612)  . midazolam (VERSED) infusion 5 mg/hr (08/22/17 0612)   [id number] (TYLENOL) oral liquid 160 mg/5 mL, albuterol, fentaNYL, fentaNYL (SUBLIMAZE) injection, loperamide, midazolam, nitroGLYCERIN, ondansetron (ZOFRAN) IV, sodium chloride flush  Assesment: She was admitted with community-acquired pneumonia that failed outpatient treatment.  She was started on treatment with antibiotics but continued to deteriorate.  She had an almost complete "white out" of her left lung which culminated in her being intubated and placed on mechanical ventilation.  She has not been able to come off the ventilator.  She has COPD at baseline which is being treated.  She has sleep apnea at baseline which may complicate her situation.  Her pneumonia is improved.  She has chronic diastolic  heart failure and she is been being diuresed.  She has atrial fib she is on amiodarone and now is in sinus bradycardia.  She is on full anticoagulation  She has been having trouble with encephalopathy which is complicated trying to get her off of the ventilator.  She is going to require tracheostomy now. Principal Problem:   CAP Failed Outpatient Tx Active Problems:   Hypothyroidism   Hyperlipidemia   Essential hypertension   Atrial fibrillation  (HCC)   Congestive heart failure (HCC)   Gastroparesis   Sleep apnea   Chronic anticoagulation   Tremor   Dementia   DM type 2 (diabetes mellitus, type 2) (HCC)   Chronic diastolic CHF (congestive heart failure) (HCC)   Pulmonary hypertension (HCC)   COPD exacerbation (HCC)   Acute respiratory failure with hypercapnia (HCC)   Acute respiratory failure with hypoxia and hypercapnia (HCC)   Uncontrolled type 2 diabetes mellitus with hyperglycemia (HCC)   Acute on chronic diastolic CHF (congestive heart failure) ([place])   Palliative care by specialist   Goals of care, counseling/discussion    Plan: Transfer to long-term acute care hospital when bed available    LOS: 17 days   Andrey Mccaskill L 08/22/2017, 7:40 AM

## 2017-08-22 NOTE — Care Management (Signed)
Patient Information   SS# [id number]  Patient Name [name], [name] ([id number]) Sex Female DOB 05-21-1951  Room Bed  IC11 IC11-01  Patient Demographics   Address [address] [place] [address] Phone [phone] (Home) E-mail Address [email or web address]  Patient Ethnicity & Race   Ethnic Group Patient Race  Not Hispanic or Latino White or Caucasian  Emergency Contact(s)   Name Relation Home Work Mobile  [place] Spouse [phone]  [phone]  Sibert,Denise Daughter [phone]  [phone]  Documents on File    Status Date Received Description  Documents for the Patient  EMR Medication Summary Not Received    EMR Problem Summary Not Received    EMR Patient Summary Not Received    [place] HIPAA NOTICE OF PRIVACY - Scanned Received 08/19/10   [place] E-Signature HIPAA Notice of Privacy Received 07/30/12   [place] E-Signature HIPAA Notice of Privacy Spanish Not Received    Driver's License Not Received    Advance Directives/Living Will/HCPOA/POA Not Received    Driver's License Not Received    [place] HIPAA NOTICE OF PRIVACY - Scanned Not Received    Historic Radiology Documentation Not Received    Insurance Card Not Received    Technical brewer Not Received    Insurance Card Not Received    Insurance Card Not Received    Insurance Card Not Received    Insurance Card Received 08/27/12   Insurance Card Not Received    Insurance Card Not Received    Insurance Card Not Received    HIM ROI Authorization Not Received    AMB Correspondence Not Received  03/13 [facility]  Insurance Card Not Received    Release of Information Not Received    HIM ROI Authorization Not Received    Insurance Card Received 11/25/12 aarp  Advanced Beneficiary Notice (ABN) Not Received    HIM ROI Authorization  08/28/13   HIM ROI Authorization  01/04/14   HIM ROI Authorization  02/10/14 patient accounting reba safford  Insurance Card Received  03/31/14 MEDICARE/COVENTRY  HIM ROI Authorization  04/02/14   HIM ROI Authorization  04/17/14 ECS repesenting [employer]   HIM ROI Authorization  06/04/14   Insurance Card   medicare/combine/dhl/aped  Insurance Card Received 08/27/14 medicare and combined  AMB Correspondence  08/22/14 CLINICAL NOTE [facility]  HIM ROI Authorization  09/23/14   HIM ROI Authorization  09/23/14   Other Photo ID Not Received    AMB Correspondence  09/17/14 PRESCRIBER NOTICE TRANSAMERICA MEDICARERX  HIM ROI Authorization  03/16/15   AMB Correspondence  05/12/15 LETTER B[facility]STOL-[facility]ERS SQIUBB  Insurance Card Received 01/22/16 Combined Ins-active 2017  HIM ROI Authorization  12/09/15   Release of Information Received 02/05/16 RGA  HIM ROI Authorization  02/11/16   Insurance Card Received 03/02/16 ROSM/ Medicare and Combined  Release of Information Received 03/02/16 DPR signed for [employer] Received 08/16/16 Medicare/Combined/ROSM  HIM ROI Authorization  08/01/16   AMB Correspondence  06/24/16 PLAN OF CARE [facility]  Insurance Card     AMB Correspondence  07/26/16 PLAN OF CARE [facility]  AMB Correspondence  08/05/16 PROGRESS NOTE PT AND HAND  AMB Correspondence  09/05/16 END OF CARE PT AND HAND  HIM ROI Authorization  01/26/17   Insurance Card Received 08/05/17 2019 insurance  Insurance Card Received (Deleted) 07/25/12   Insurance Card Not Received (Deleted)    Insurance Card Not Received (Deleted)  AMB Correspondence (Deleted) 06/24/16 POC HARRISON MD,S  HIM Release of Information Output  06/26/17 Notes Individual  Documents for the Encounter  AOB (Assignment of Insurance Benefits) Not Received    E-signature AOB Signed 08/05/17   MEDICARE RIGHTS Not Received    E-signature Medicare Rights Signed 08/05/17   Cardiac Monitoring Strip Shift Summary Received 08/05/17   ED Patient Billing Extract   ED PB  Billing Extract  Cardiac Monitoring Strip Received 08/08/17   EKG Received (Deleted) 08/07/17   EKG Received (Deleted) 08/07/17   EKG Received 08/07/17   Admission Information   Attending Provider Admitting Provider Admission Type Admission Date/Time  Tat, Onalee Hua, MD Cleora Fleet, MD Emergency 08/05/17 [phone]  Discharge Date Hospital Service Auth/Cert Status Service Area   Critical Care Incomplete [place] SERVICE AREA  Unit Room/Bed Admission Status   AP-ICCUP NURSING IC11/IC11-01 Admission (Confirmed)   Admission   Complaint  trouble breathing, oxygen levels low  Hospital Account   Name Acct ID Class Status Primary Coverage  [place], Suver [id number] Inpatient Open MEDICARE - MEDICARE PART A AND B      Guarantor Account (for Hospital Account [id number])   Name Relation to Pt Service Area Active? Acct Type  [name] Self [facility]SA Yes Personal/Family  Address Phone    [address] [place], [place] [address] [phone](H)        Coverage Information (for Hospital Account [id number])   1. MEDICARE/MEDICARE PART A AND B   F/O Payor/Plan Precert #  MEDICARE/MEDICARE PART A AND B   Subscriber Subscriber #  Kailei, Atehortua [id number]  Address Phone  [address] [place], [place] [address]   2. GENERIC COMMERCIAL/GENERIC COMMERCIAL   F/O Payor/Plan Precert #  [facility] COMMERCIAL/GENERIC COMMERCIAL   Subscriber Subscriber #  Beverley, Bynum [id number]  Address Phone  [address] [place], [place] [address] [phone]

## 2017-08-22 NOTE — Discharge Summary (Signed)
Physician Discharge Summary  [name] ZOX:[id number] DOB: 1951-05-19 DOA: 08/05/2017  PCP: Gareth Morgan, MD  Admit date: 08/05/2017 Discharge date: 08/22/2017  Admitted From: Home Disposition:  [facility]--[facility]    Discharge Condition: Stable CODE STATUS:FULL Diet recommendation: Vital High Protein enteral feed@55  cc/hr  Brief/Interim Summary: 67 y.o.femalewith multiple medical comorbidities detailed below presented to the emergency department with persistent cough and shortness of breath for the past 2 weeks that failed outpatient management with 2 courses of oral amoxicillin. Patient reports that she is on her second round of amoxicillin and continues to feel short of breath. She has nonproductive cough. She has some wheezing. She has no chest pain. The patient has no swelling in the extremities. She has a congestive heart failure that is chronic and she is taking her diuretics regularly. She denies palpitations. She presented to the ED and noted to be tachypneic. She was afebrile. A chest x-ray was obtained that did show a large left retro-cardiac opacity that was suspicious for pneumonia. EKG was unremarkable except for notation of prolonged PR interval. Her troponin was 0.07. She had a mildly elevated BNP at 189. The patient's blood sugar was elevated at 236. She was given treatment in the emergency department but remained tachypneic.   She was admitted to the hospitaland initially required BiPAP therapy, but then decompensated and required intubation. She has been slow to progress. Weaning trials have been complicated by development of agitation. The patient was noted to have acute on chronic diastolic CHF. She was started on intravenous furosemide with good clinical effect. Weaning trials have been difficult in part due to agitation and low tidal volumes. Pulmonology following.  On the evening of 08/20/2017, the patient did well with weaning on  pressure support ventilation.  As a result, Dr. Juanetta Gosling felt that extubation was indicated on the morning of 08/21/2017.  2 hours after extubation, the patient developed progressive respiratory distress and required reintubation.  After family meeting, the patient's daughters wanted to continue the full scope of care.  With assistance of case management, the patient will be transferred to [facility] where a tracheostomy and gastrostomy tube will likely need to be placed.    Discharge Diagnoses:  Acute respiratory failure with hypoxia and hypercapnia -Multifactorial with the main driving factor related to pneumonia -The patient also has COPD and diastolic CHF with likely undiagnosed OSA/OHS -Appreciate pulmonary follow-up -Continue weaning from mechanical ventilation--pt tolerated CPAP/PS all night 2/17-2/18 -Extubated am 08/21/17 -Continue Solu-Medrol--decrease to once daily -2/18--personally reviewed CXR--interstitial prominence but improving -08/21/17--extubated; re-intubated 2 hours later -Continue Versed and fentanyl for sedation.  Lobar Pneumonia -check PCT<0.10 -d/cedabx after2/13doses--completed12 days  Acute onChronic diastolic CHF -ContinueIV lasix>>>po lasix -CVP 9-12 -maintain neg fluid balance--I/O not accurate past 24 hours -2/13BNP--324.0 -12/26/2015 Echo--normal EF, grade 1 DD, mild MR, PAS P 44 -Daily weights--NEG8lbs -repeat Echo--EF 55-60%, grade 2 DD, PAS P 57, mild TR  Paroxysmal atrial fibrillation -Continue amiodarone -Presently in sinus rhythm -Continue apixaban  Essential hypertension -Stable presently  COPD -No wheezing presently  Diabetes mellitus type 2, uncontrolled with hyperglycemia -Check hemoglobin A1c--9.4 -Continue Lantus 55 units at bedtime -Continue NovoLog sliding scale every 4 hours  Hypothyroidism -Continue Synthroid   Seizure disorder -Continue Keppra  Hypokalemia -repleted -check mag--2.1  Goals of  Care -daughterwishes for full scope of care -consult palliative medicine     Discharge Instructions  Discharge Instructions    Diet - low sodium heart healthy   Complete by:  As directed  Increase activity slowly   Complete by:  As directed      Allergies as of 08/22/2017      Reactions   Ibuprofen Other (See Comments)   Upsets her stomach and causes abdominal pain   Codeine Nausea And Vomiting, Other (See Comments)   HALLUCINATIONS   Dilaudid [hydromorphone Hcl] Other (See Comments)   Made her pass out   Celexa [citalopram Hydrobromide] Other (See Comments)   Dyskinesia   Reglan [metoclopramide] Other (See Comments)   DYSKINESIA      Medication List    STOP taking these medications   albuterol 108 (90 Base) MCG/ACT inhaler Commonly known as:  PROVENTIL HFA;VENTOLIN HFA   amoxicillin 500 MG capsule Commonly known as:  AMOXIL   B-12 5000 MCG Subl   dicyclomine 10 MG capsule Commonly known as:  BENTYL   DULoxetine 30 MG capsule Commonly known as:  CYMBALTA   famotidine 20 MG tablet Commonly known as:  PEPCID   glipiZIDE 10 MG tablet Commonly known as:  GLUCOTROL   LANTUS SOLOSTAR 100 UNIT/ML Solostar Pen Generic drug:  Insulin Glargine Replaced by:  insulin glargine 100 UNIT/ML injection   loperamide 2 MG capsule Commonly known as:  IMODIUM   LORazepam 1 MG tablet Commonly known as:  ATIVAN   metaxalone 800 MG tablet Commonly known as:  SKELAXIN   metFORMIN 1000 MG tablet Commonly known as:  GLUCOPHAGE   nitroGLYCERIN 0.4 MG SL tablet Commonly known as:  NITROSTAT   ondansetron 4 MG tablet Commonly known as:  ZOFRAN   promethazine 12.5 MG tablet Commonly known as:  PHENERGAN   UNABLE TO FIND     TAKE these medications   acetaminophen 500 MG tablet Commonly known as:  TYLENOL Take 500 mg by mouth every 6 (six) hours as needed for moderate pain or headache.   amiodarone 200 MG tablet Commonly known as:  PACERONE Take 1 tablet (200  mg total) by mouth every morning.   apixaban 5 MG Tabs tablet Commonly known as:  ELIQUIS Take 1 tablet (5 mg total) by mouth 2 (two) times daily.   feeding supplement (VITAL HIGH PROTEIN) Liqd liquid Run continuous at 55 cc/hour   fentaNYL 2,500 mcg in sodium chloride 0.9 % 200 mL Titrate 25-400 mcg/hour for sedation on mechanical ventilation   furosemide 40 MG tablet Commonly known as:  LASIX TAKE 40 MG DAILY.   gabapentin 400 MG capsule Commonly known as:  NEURONTIN Take 400-800 mg by mouth 3 (three) times daily. Take 1 capsule(400mg ) by mouth twice a day and take 2 capsules(800mg ) by mouth at bedtime   insulin aspart 100 UNIT/ML injection Commonly known as:  novoLOG Inject 0-20 Units into the skin every 4 (four) hours. CBG 0-120--no units; 121-150--3 units; 151-200--4 units; 201-250--7units; 251-300--11 units; 301-350--15 units; 351-400--20 units   insulin glargine 100 UNIT/ML injection Commonly known as:  LANTUS Inject 0.55 mLs (55 Units total) into the skin at bedtime. Replaces:  LANTUS SOLOSTAR 100 UNIT/ML Solostar Pen   ipratropium-albuterol 0.5-2.5 (3) MG/3ML Soln Commonly known as:  DUONEB Take 3 mLs by nebulization every 6 (six) hours.   levETIRAcetam 1000 MG tablet Commonly known as:  KEPPRA Take 1,000 mg by mouth 2 (two) times daily.   levothyroxine 137 MCG tablet Commonly known as:  SYNTHROID, LEVOTHROID Take 137 mcg by mouth daily.   magnesium oxide 400 MG tablet Commonly known as:  MAG-OX Take 400 mg by mouth daily.   multivitamin with minerals Tabs tablet Take 1 tablet  by mouth daily.   pantoprazole 40 MG tablet Commonly known as:  PROTONIX TAKE ONE TABLET BY MOUTH TWICE DAILY BEFORE APPLY MEAL   potassium chloride 10 MEQ tablet Commonly known as:  K-DUR,KLOR-CON TAKE TWO TABLETS BY MOUTH TWICE DAILY       Allergies  Allergen Reactions  . Ibuprofen Other (See Comments)    Upsets her stomach and causes abdominal pain  . Codeine Nausea And  Vomiting and Other (See Comments)    HALLUCINATIONS  . Dilaudid [Hydromorphone Hcl] Other (See Comments)    Made her pass out  . Celexa [Citalopram Hydrobromide] Other (See Comments)    Dyskinesia  . Reglan [Metoclopramide] Other (See Comments)    DYSKINESIA    Consultations:  Pulmonary medicine  Palliative medicine   Procedures/Studies: Dg Chest 1 View  Result Date: 08/19/2017 CLINICAL DATA:  Respiratory failure EXAM: CHEST 1 VIEW COMPARISON:  08/18/2017 FINDINGS: Endotracheal tube in good position. NG tube remains in place. Left arm PICC tip in the proximal SVC. Cardiac enlargement with improvement in pulmonary vascular congestion. Negative for edema. Improvement in bibasilar atelectasis. IMPRESSION: Improvement in congestive heart failure and bibasilar atelectasis. Electronically Signed   By: Marlan Palau M.D.   On: 08/19/2017 08:28   Dg Chest 2 View  Result Date: 08/05/2017 CLINICAL DATA:  Shortness of breath.  Cough. EXAM: CHEST  2 VIEW COMPARISON:  September 30, 2016 FINDINGS: No pneumothorax. Stable cardiomegaly. The hila and mediastinum are unchanged. There appears to be increased opacity in the left retrocardiac region which could represent pneumonia given history. Recommend follow-up to resolution. IMPRESSION: Left retrocardiac opacity may represent pneumonia given history. Recommend follow-up to resolution. Electronically Signed   By: Gerome Sam III M.D   On: 08/05/2017 09:43   Dg Chest Port 1 View  Result Date: 08/21/2017 CLINICAL DATA:  Respiratory failure EXAM: PORTABLE CHEST 1 VIEW COMPARISON:  08/19/2017 FINDINGS: Endotracheal tube in good position.  NG tube enters the stomach. Left lower lobe airspace disease unchanged. Right lung remains clear. Cardiac enlargement without heart failure. IMPRESSION: Endotracheal tube remains in good position. Left lower lobe atelectasis/infiltrate unchanged. Electronically Signed   By: Marlan Palau M.D.   On: 08/21/2017 08:19   Dg  Chest Port 1 View  Result Date: 08/18/2017 CLINICAL DATA:  Hypoxia EXAM: PORTABLE CHEST 1 VIEW COMPARISON:  August 17, 2017 FINDINGS: Endotracheal tube tip is 3.5 cm above the carina. Nasogastric tube tip and side port are below the diaphragm in the stomach. No pneumothorax. There are small pleural effusions bilaterally. There is interstitial pulmonary edema. There is atelectatic change in both lung bases. There is cardiomegaly with pulmonary venous hypertension. There is aortic atherosclerosis. No adenopathy. No bone lesions. IMPRESSION: Tube positions as described without pneumothorax. Pulmonary vascular congestion with interstitial edema and small pleural effusions bilaterally. Appearance radiographically is indicative of congestive heart failure. There is bibasilar atelectasis. Electronically Signed   By: Bretta Bang III M.D.   On: 08/18/2017 07:08   Dg Chest Port 1 View  Result Date: 08/17/2017 CLINICAL DATA:  Intubation. EXAM: PORTABLE CHEST 1 VIEW COMPARISON:  08/16/2017. FINDINGS: Endotracheal tube and NG tube in stable position. Cardiomegaly with bilateral interstitial prominence consistent pulmonary edema again noted. No interim change. Small left pleural scratched it small pleural effusions can't be excluded. No pneumothorax. IMPRESSION: 1.  Endotracheal tube and NG tube in stable position. 2. Findings consistent congestive heart failure bilateral interstitial edema small pleural effusions. No interim change from prior exam. Electronically Signed   By:  Thomas  Register   On: 08/17/2017 05:49   Dg Chest Port 1 View  Result Date: 08/16/2017 CLINICAL DATA:  Respiratory failure. EXAM: PORTABLE CHEST 1 VIEW COMPARISON:  08/15/2017 FINDINGS: The patient is rotated to the right. Endotracheal tube terminates 4.2 cm above the carina. Enteric tube courses into the upper abdomen with tip not imaged. The cardiac silhouette remains enlarged. Lung volumes remain low with grossly similar appearance of  left greater than right basilar opacities suggesting atelectasis. Pulmonary vascular congestion and bilateral interstitial densities have slightly increased. No large pleural effusion or pneumothorax is identified. IMPRESSION: 1. Slightly increased pulmonary edema. 2. Bibasilar atelectasis. Electronically Signed   By: Sebastian Ache M.D.   On: 08/16/2017 07:48   Dg Chest Port 1 View  Result Date: 08/15/2017 CLINICAL DATA:  Respiratory failure, CHF, COPD EXAM: PORTABLE CHEST 1 VIEW COMPARISON:  Portable chest x-ray of August 14, 2017 FINDINGS: The lungs are mildly hypoinflated. The interstitial markings are coarse though slightly improved. There are densities at both lung bases which may reflect atelectasis. There is no pleural effusion or pneumothorax. The cardiac silhouette is enlarged. The pulmonary vascularity is mildly engorged but less prominent than on the previous study. There calcification in the wall of the aortic arch. The endotracheal tube tip projects 4.8 cm above the carina. The esophagogastric tube tip and proximal port project below the GE junction. IMPRESSION: Bilateral hypoinflation. Slightly decreased pulmonary interstitial edema. Stable cardiomegaly with decreased pulmonary vascular congestion. Probable bibasilar subsegmental atelectasis. The support tubes are in reasonable position. Thoracic aortic atherosclerosis. Electronically Signed   By: Caydence Enck  [place] M.D.   On: 08/15/2017 08:40   Dg Chest Port 1 View  Result Date: 08/14/2017 CLINICAL DATA:  Respiratory failure EXAM: PORTABLE CHEST 1 VIEW COMPARISON:  Yesterday FINDINGS: Endotracheal tube tip is between the clavicular heads and carina. An orogastric tube and side-port reaches the stomach. Cardiopericardial enlargement and vascular pedicle widening. There is diffuse haziness of the chest and interstitial opacities. IMPRESSION: 1. Unremarkable positioning of endotracheal and orogastric tubes. 2. CHF pattern.  Pneumonia and atelectasis  could be superimposed. Electronically Signed   By: Marnee Spring M.D.   On: 08/14/2017 06:48   Dg Chest Port 1 View  Result Date: 08/13/2017 CLINICAL DATA:  Respiratory failure EXAM: PORTABLE CHEST 1 VIEW COMPARISON:  08/12/2017 and prior radiographs FINDINGS: Endotracheal tube 2.5 cm above the carina, NG tube entering the stomach with tip off the field of view again noted. Cardiomegaly and bilateral interstitial/airspace opacities, left-greater-than-right, are not significantly changed. Left lower lung consolidation/atelectasis again noted. There is no evidence of pneumothorax. IMPRESSION: Unchanged appearance of the chest with continued bilateral interstitial/airspace opacities/edema and left lower lung consolidation/atelectasis. Electronically Signed   By: Harmon Pier M.D.   On: 08/13/2017 10:00   Dg Chest Port 1 View  Result Date: 08/12/2017 CLINICAL DATA:  Respiratory failure with shortness of breath EXAM: PORTABLE CHEST 1 VIEW COMPARISON:  Yesterday FINDINGS: Endotracheal tube tip between the clavicular heads and carina. Orogastric tube with tip at the GE junction. Cardiopericardial enlargement. There is diffuse hazy opacity of the chest with probable pleural fluid greater on the left. No visible pneumothorax. Artifact from EKG leads. These results will be called to the ordering clinician or representative by the Radiologist Assistant, and communication documented in the PACS or zVision Dashboard. IMPRESSION: 1. Orogastric tube tip at the GE junction. Consider ~5 cm advancement into the stomach. 2. CHF pattern similar to yesterday. Electronically Signed   By: Kathrynn Ducking.D.  On: 08/12/2017 07:52   Dg Chest Port 1 View  Result Date: 08/11/2017 CLINICAL DATA:  Respiratory failure EXAM: PORTABLE CHEST 1 VIEW COMPARISON:  08/10/2017 FINDINGS: Cardiac shadow remains enlarged. Aortic calcifications are again seen. Endotracheal tube and nasogastric catheter are noted in satisfactory position.  Diffuse airspace opacities are again identified bilaterally with some improved aeration in the left upper lobe. No bony abnormality is seen. IMPRESSION: Slight improved aeration particularly on the left. Electronically Signed   By: Alcide Clever M.D.   On: 08/11/2017 07:54   Portable Chest Xray  Result Date: 08/10/2017 CLINICAL DATA:  Endotracheal tube EXAM: PORTABLE CHEST 1 VIEW COMPARISON:  08/09/2017 FINDINGS: Endotracheal tube 3 cm above the carina.  NG tube in the stomach Diffuse bilateral airspace disease asymmetric on the left shows mild interval improvement. No significant effusion. IMPRESSION: Endotracheal tube 3 cm above the carina Severe diffuse bilateral airspace disease with mild interval improvement from yesterday. Electronically Signed   By: Marlan Palau M.D.   On: 08/10/2017 06:51   Portable Chest X-ray  Result Date: 08/09/2017 CLINICAL DATA:  Endotracheal tube placement. EXAM: PORTABLE CHEST 1 VIEW COMPARISON:  Radiograph of August 08, 2017. FINDINGS: Mild cardiomegaly is noted with central pulmonary vascular congestion. Endotracheal tube is seen projected over tracheal air shadow with distal tip 2 cm above the carina. Nasogastric tube is seen passing through the esophagus into the stomach. Stable bilateral lung opacities are noted, left greater than right, most consistent with pneumonia or possibly edema. No pneumothorax is noted. Probable left pleural effusion cannot be excluded. Bony thorax is unremarkable. IMPRESSION: Endotracheal and nasogastric tubes are in grossly good position. Stable bilateral lung opacities are noted most consistent with pneumonia or possibly edema. Left pleural effusion cannot be excluded. Electronically Signed   By: Lupita Raider, M.D.   On: 08/09/2017 09:28   Dg Chest Port 1 View  Result Date: 08/08/2017 CLINICAL DATA:  Respiratory failure. EXAM: PORTABLE CHEST 1 VIEW COMPARISON:  August 05, 2017 FINDINGS: There has been progression of airspace  consolidation on the left. There is now airspace consolidation throughout most of the left lung. There is a small left pleural effusion. The right lung is clear. There is cardiomegaly with pulmonary venous hypertension. There is aortic atherosclerosis. No adenopathy. No evident bone lesions. IMPRESSION: Consolidation throughout most of the left lung felt to represent widespread pneumonia, progressed from recent study. Right lung clear. Underlying pulmonary vascular congestion. There is aortic atherosclerosis. Aortic Atherosclerosis (ICD10-I70.0). Electronically Signed   By: Bretta Bang III M.D.   On: 08/08/2017 09:27   Dg Chest Port 1v Same Day  Result Date: 08/21/2017 CLINICAL DATA:  Orogastric placement. EXAM: PORTABLE CHEST 1 VIEW COMPARISON:  Earlier same day FINDINGS: Endotracheal tube tip is 2 cm above the carina. Orogastric tube enters the stomach with its tip at least in the mid body of the stomach. Left arm PICC tip is at the SVC innominate junction. Cardiomegaly persists with mild edema, and volume loss in the left lower lobe IMPRESSION: Left arm PICC tip at the SVC innominate junction. Endotracheal tube and nasogastric tube well positioned. Persistent mild edema and left lower lobe volume loss. Electronically Signed   By: Paulina Fusi M.D.   On: 08/21/2017 16:39   Dg Chest Port 1v Same Day  Result Date: 08/17/2017 CLINICAL DATA:  Check gastric catheter placement EXAM: PORTABLE CHEST 1 VIEW COMPARISON:  08/17/2017 FINDINGS: Cardiac shadow is enlarged but stable. Endotracheal tube is noted in satisfactory position. Nasogastric catheter  is noted tip within the stomach. Lungs are well aerated with vascular congestion and interstitial edema similar that seen prior exam. IMPRESSION: The overall appearance of the chest is stable from the prior exam. Electronically Signed   By: Alcide Clever M.D.   On: 08/17/2017 21:24   Dg Chest Port 1v Same Day  Result Date: 08/12/2017 CLINICAL DATA:  OG tube  replacement. EXAM: PORTABLE CHEST 1 VIEW COMPARISON:  08/12/2017 FINDINGS: An endotracheal tube is identified with 2 cm above the carina. And OG tube enters the stomach with tip off the field of view. Cardiomegaly, pulmonary vascular congestion, right basilar atelectasis and left lower lung consolidation/atelectasis again noted. There is no evidence of pneumothorax. IMPRESSION: Support apparatus as described. Otherwise unchanged appearance of the chest. Electronically Signed   By: Harmon Pier M.D.   On: 08/12/2017 11:40         Discharge Exam: Vitals:   08/22/17 0900 08/22/17 1000  BP: (!) 108/49 (!) 115/58  Pulse: 66 62  Resp: 10 16  Temp:    SpO2: 96% 99%   Vitals:   08/22/17 0842 08/22/17 0844 08/22/17 0900 08/22/17 1000  BP:   (!) 108/49 (!) 115/58  Pulse:   66 62  Resp:   10 16  Temp: 98.7 F (37.1 C)     TempSrc: Oral     SpO2: 96% 95% 96% 99%  Weight:      Height:        General: Pt is alert, awake, not in acute distress Cardiovascular: RRR, S1/S2 +, no rubs, no gallops Respiratory: CTA bilaterally, no wheezing, no rhonchi Abdominal: Soft, NT, ND, bowel sounds + Extremities: no edema, no cyanosis   The results of significant diagnostics from this hospitalization (including imaging, microbiology, ancillary and laboratory) are listed below for reference.    Significant Diagnostic Studies: Dg Chest 1 View  Result Date: 08/19/2017 CLINICAL DATA:  Respiratory failure EXAM: CHEST 1 VIEW COMPARISON:  08/18/2017 FINDINGS: Endotracheal tube in good position. NG tube remains in place. Left arm PICC tip in the proximal SVC. Cardiac enlargement with improvement in pulmonary vascular congestion. Negative for edema. Improvement in bibasilar atelectasis. IMPRESSION: Improvement in congestive heart failure and bibasilar atelectasis. Electronically Signed   By: Marlan Palau M.D.   On: 08/19/2017 08:28   Dg Chest 2 View  Result Date: 08/05/2017 CLINICAL DATA:  Shortness of breath.   Cough. EXAM: CHEST  2 VIEW COMPARISON:  September 30, 2016 FINDINGS: No pneumothorax. Stable cardiomegaly. The hila and mediastinum are unchanged. There appears to be increased opacity in the left retrocardiac region which could represent pneumonia given history. Recommend follow-up to resolution. IMPRESSION: Left retrocardiac opacity may represent pneumonia given history. Recommend follow-up to resolution. Electronically Signed   By: Gerome Sam III M.D   On: 08/05/2017 09:43   Dg Chest Port 1 View  Result Date: 08/21/2017 CLINICAL DATA:  Respiratory failure EXAM: PORTABLE CHEST 1 VIEW COMPARISON:  08/19/2017 FINDINGS: Endotracheal tube in good position.  NG tube enters the stomach. Left lower lobe airspace disease unchanged. Right lung remains clear. Cardiac enlargement without heart failure. IMPRESSION: Endotracheal tube remains in good position. Left lower lobe atelectasis/infiltrate unchanged. Electronically Signed   By: Marlan Palau M.D.   On: 08/21/2017 08:19   Dg Chest Port 1 View  Result Date: 08/18/2017 CLINICAL DATA:  Hypoxia EXAM: PORTABLE CHEST 1 VIEW COMPARISON:  August 17, 2017 FINDINGS: Endotracheal tube tip is 3.5 cm above the carina. Nasogastric tube tip and side port  are below the diaphragm in the stomach. No pneumothorax. There are small pleural effusions bilaterally. There is interstitial pulmonary edema. There is atelectatic change in both lung bases. There is cardiomegaly with pulmonary venous hypertension. There is aortic atherosclerosis. No adenopathy. No bone lesions. IMPRESSION: Tube positions as described without pneumothorax. Pulmonary vascular congestion with interstitial edema and small pleural effusions bilaterally. Appearance radiographically is indicative of congestive heart failure. There is bibasilar atelectasis. Electronically Signed   By: Bretta Bang III M.D.   On: 08/18/2017 07:08   Dg Chest Port 1 View  Result Date: 08/17/2017 CLINICAL DATA:  Intubation.  EXAM: PORTABLE CHEST 1 VIEW COMPARISON:  08/16/2017. FINDINGS: Endotracheal tube and NG tube in stable position. Cardiomegaly with bilateral interstitial prominence consistent pulmonary edema again noted. No interim change. Small left pleural scratched it small pleural effusions can't be excluded. No pneumothorax. IMPRESSION: 1.  Endotracheal tube and NG tube in stable position. 2. Findings consistent congestive heart failure bilateral interstitial edema small pleural effusions. No interim change from prior exam. Electronically Signed   By: Maisie Fus  Register   On: 08/17/2017 05:49   Dg Chest Port 1 View  Result Date: 08/16/2017 CLINICAL DATA:  Respiratory failure. EXAM: PORTABLE CHEST 1 VIEW COMPARISON:  08/15/2017 FINDINGS: The patient is rotated to the right. Endotracheal tube terminates 4.2 cm above the carina. Enteric tube courses into the upper abdomen with tip not imaged. The cardiac silhouette remains enlarged. Lung volumes remain low with grossly similar appearance of left greater than right basilar opacities suggesting atelectasis. Pulmonary vascular congestion and bilateral interstitial densities have slightly increased. No large pleural effusion or pneumothorax is identified. IMPRESSION: 1. Slightly increased pulmonary edema. 2. Bibasilar atelectasis. Electronically Signed   By: Sebastian Ache M.D.   On: 08/16/2017 07:48   Dg Chest Port 1 View  Result Date: 08/15/2017 CLINICAL DATA:  Respiratory failure, CHF, COPD EXAM: PORTABLE CHEST 1 VIEW COMPARISON:  Portable chest x-ray of August 14, 2017 FINDINGS: The lungs are mildly hypoinflated. The interstitial markings are coarse though slightly improved. There are densities at both lung bases which may reflect atelectasis. There is no pleural effusion or pneumothorax. The cardiac silhouette is enlarged. The pulmonary vascularity is mildly engorged but less prominent than on the previous study. There calcification in the wall of the aortic arch. The  endotracheal tube tip projects 4.8 cm above the carina. The esophagogastric tube tip and proximal port project below the GE junction. IMPRESSION: Bilateral hypoinflation. Slightly decreased pulmonary interstitial edema. Stable cardiomegaly with decreased pulmonary vascular congestion. Probable bibasilar subsegmental atelectasis. The support tubes are in reasonable position. Thoracic aortic atherosclerosis. Electronically Signed   By: Nazaire Cordial  [place] M.D.   On: 08/15/2017 08:40   Dg Chest Port 1 View  Result Date: 08/14/2017 CLINICAL DATA:  Respiratory failure EXAM: PORTABLE CHEST 1 VIEW COMPARISON:  Yesterday FINDINGS: Endotracheal tube tip is between the clavicular heads and carina. An orogastric tube and side-port reaches the stomach. Cardiopericardial enlargement and vascular pedicle widening. There is diffuse haziness of the chest and interstitial opacities. IMPRESSION: 1. Unremarkable positioning of endotracheal and orogastric tubes. 2. CHF pattern.  Pneumonia and atelectasis could be superimposed. Electronically Signed   By: Marnee Spring M.D.   On: 08/14/2017 06:48   Dg Chest Port 1 View  Result Date: 08/13/2017 CLINICAL DATA:  Respiratory failure EXAM: PORTABLE CHEST 1 VIEW COMPARISON:  08/12/2017 and prior radiographs FINDINGS: Endotracheal tube 2.5 cm above the carina, NG tube entering the stomach with tip off the field of  view again noted. Cardiomegaly and bilateral interstitial/airspace opacities, left-greater-than-right, are not significantly changed. Left lower lung consolidation/atelectasis again noted. There is no evidence of pneumothorax. IMPRESSION: Unchanged appearance of the chest with continued bilateral interstitial/airspace opacities/edema and left lower lung consolidation/atelectasis. Electronically Signed   By: Harmon Pier M.D.   On: 08/13/2017 10:00   Dg Chest Port 1 View  Result Date: 08/12/2017 CLINICAL DATA:  Respiratory failure with shortness of breath EXAM: PORTABLE CHEST 1  VIEW COMPARISON:  Yesterday FINDINGS: Endotracheal tube tip between the clavicular heads and carina. Orogastric tube with tip at the GE junction. Cardiopericardial enlargement. There is diffuse hazy opacity of the chest with probable pleural fluid greater on the left. No visible pneumothorax. Artifact from EKG leads. These results will be called to the ordering clinician or representative by the Radiologist Assistant, and communication documented in the PACS or zVision Dashboard. IMPRESSION: 1. Orogastric tube tip at the GE junction. Consider ~5 cm advancement into the stomach. 2. CHF pattern similar to yesterday. Electronically Signed   By: Marnee Spring M.D.   On: 08/12/2017 07:52   Dg Chest Port 1 View  Result Date: 08/11/2017 CLINICAL DATA:  Respiratory failure EXAM: PORTABLE CHEST 1 VIEW COMPARISON:  08/10/2017 FINDINGS: Cardiac shadow remains enlarged. Aortic calcifications are again seen. Endotracheal tube and nasogastric catheter are noted in satisfactory position. Diffuse airspace opacities are again identified bilaterally with some improved aeration in the left upper lobe. No bony abnormality is seen. IMPRESSION: Slight improved aeration particularly on the left. Electronically Signed   By: Alcide Clever M.D.   On: 08/11/2017 07:54   Portable Chest Xray  Result Date: 08/10/2017 CLINICAL DATA:  Endotracheal tube EXAM: PORTABLE CHEST 1 VIEW COMPARISON:  08/09/2017 FINDINGS: Endotracheal tube 3 cm above the carina.  NG tube in the stomach Diffuse bilateral airspace disease asymmetric on the left shows mild interval improvement. No significant effusion. IMPRESSION: Endotracheal tube 3 cm above the carina Severe diffuse bilateral airspace disease with mild interval improvement from yesterday. Electronically Signed   By: Marlan Palau M.D.   On: 08/10/2017 06:51   Portable Chest X-ray  Result Date: 08/09/2017 CLINICAL DATA:  Endotracheal tube placement. EXAM: PORTABLE CHEST 1 VIEW COMPARISON:   Radiograph of August 08, 2017. FINDINGS: Mild cardiomegaly is noted with central pulmonary vascular congestion. Endotracheal tube is seen projected over tracheal air shadow with distal tip 2 cm above the carina. Nasogastric tube is seen passing through the esophagus into the stomach. Stable bilateral lung opacities are noted, left greater than right, most consistent with pneumonia or possibly edema. No pneumothorax is noted. Probable left pleural effusion cannot be excluded. Bony thorax is unremarkable. IMPRESSION: Endotracheal and nasogastric tubes are in grossly good position. Stable bilateral lung opacities are noted most consistent with pneumonia or possibly edema. Left pleural effusion cannot be excluded. Electronically Signed   By: Lupita Raider, M.D.   On: 08/09/2017 09:28   Dg Chest Port 1 View  Result Date: 08/08/2017 CLINICAL DATA:  Respiratory failure. EXAM: PORTABLE CHEST 1 VIEW COMPARISON:  August 05, 2017 FINDINGS: There has been progression of airspace consolidation on the left. There is now airspace consolidation throughout most of the left lung. There is a small left pleural effusion. The right lung is clear. There is cardiomegaly with pulmonary venous hypertension. There is aortic atherosclerosis. No adenopathy. No evident bone lesions. IMPRESSION: Consolidation throughout most of the left lung felt to represent widespread pneumonia, progressed from recent study. Right lung clear. Underlying pulmonary vascular  congestion. There is aortic atherosclerosis. Aortic Atherosclerosis (ICD10-I70.0). Electronically Signed   By: Bretta Bang III M.D.   On: 08/08/2017 09:27   Dg Chest Port 1v Same Day  Result Date: 08/21/2017 CLINICAL DATA:  Orogastric placement. EXAM: PORTABLE CHEST 1 VIEW COMPARISON:  Earlier same day FINDINGS: Endotracheal tube tip is 2 cm above the carina. Orogastric tube enters the stomach with its tip at least in the mid body of the stomach. Left arm PICC tip is at the  SVC innominate junction. Cardiomegaly persists with mild edema, and volume loss in the left lower lobe IMPRESSION: Left arm PICC tip at the SVC innominate junction. Endotracheal tube and nasogastric tube well positioned. Persistent mild edema and left lower lobe volume loss. Electronically Signed   By: Paulina Fusi M.D.   On: 08/21/2017 16:39   Dg Chest Port 1v Same Day  Result Date: 08/17/2017 CLINICAL DATA:  Check gastric catheter placement EXAM: PORTABLE CHEST 1 VIEW COMPARISON:  08/17/2017 FINDINGS: Cardiac shadow is enlarged but stable. Endotracheal tube is noted in satisfactory position. Nasogastric catheter is noted tip within the stomach. Lungs are well aerated with vascular congestion and interstitial edema similar that seen prior exam. IMPRESSION: The overall appearance of the chest is stable from the prior exam. Electronically Signed   By: Alcide Clever M.D.   On: 08/17/2017 21:24   Dg Chest Port 1v Same Day  Result Date: 08/12/2017 CLINICAL DATA:  OG tube replacement. EXAM: PORTABLE CHEST 1 VIEW COMPARISON:  08/12/2017 FINDINGS: An endotracheal tube is identified with 2 cm above the carina. And OG tube enters the stomach with tip off the field of view. Cardiomegaly, pulmonary vascular congestion, right basilar atelectasis and left lower lung consolidation/atelectasis again noted. There is no evidence of pneumothorax. IMPRESSION: Support apparatus as described. Otherwise unchanged appearance of the chest. Electronically Signed   By: Harmon Pier M.D.   On: 08/12/2017 11:40     Microbiology: Recent Results (from the past 240 hour(s))  Culture, Urine     Status: Abnormal   Collection Time: 08/17/17  4:00 PM  Result Value Ref Range Status   Specimen Description   Final    URINE, RANDOM Performed at [facility], [address], [place], [place] [address]    Special Requests   Final    NONE Performed at [facility], [address], [place], [place] [address]    Culture 50,000 COLONIES/mL  YEAST (A)  Final   Report Status 08/19/2017 FINAL  Final     Labs: Basic Metabolic Panel: Recent Labs  Lab 08/17/17 0457 08/18/17 0441 08/19/17 0446 08/20/17 0429 08/21/17 0512 08/22/17 0407  NA 143 146* 146* 146* 149* 148*  K 3.9 3.1* 3.4* 3.9 3.9 3.7  CL 102 103 105 103 104 103  CO2 32 34* 32 34* 35* 35*  GLUCOSE 301* 175* 108* 244* 140* 113*  BUN 47* 44* 42* 41* 43* 36*  CREATININE 0.89 0.79 0.77 0.77 0.79 0.87  CALCIUM 8.8* 8.6* 8.4* 8.4* 8.7* 8.8*  MG 2.2  --   --  2.1  --  2.0  PHOS 3.9  --   --  3.2  --   --    Liver Function Tests: No results for input(s): AST, ALT, ALKPHOS, BILITOT, PROT, ALBUMIN in the last 168 hours. No results for input(s): LIPASE, AMYLASE in the last 168 hours. No results for input(s): AMMONIA in the last 168 hours. CBC: Recent Labs  Lab 08/16/17 0529 08/17/17 0457 08/18/17 0441 08/21/17 8295  08/22/17 0407  WBC 14.9* 12.0* 12.1* 12.1* 11.9*  NEUTROABS  --  10.3*  --   --   --   HGB 10.3* 9.9* 9.6* 9.5* 9.4*  HCT 34.2* 33.2* 31.7* 32.6* 31.7*  MCV 105.2* 105.1* 105.0* 105.8* 104.6*  PLT 216 213 203 212 202   Cardiac Enzymes: No results for input(s): CKTOTAL, CKMB, CKMBINDEX, TROPONINI in the last 168 hours. BNP: Invalid input(s): POCBNP CBG: Recent Labs  Lab 08/21/17 1635 08/21/17 2024 08/21/17 2334 08/22/17 0356 08/22/17 0825  GLUCAP 161* 168* 193* 114* 52*    Time coordinating discharge:  Greater than 30 minutes  Signed:  Catarina Hartshorn, DO [facility] Pager: [phone] 08/22/2017, 10:44 AM

## 2017-08-22 NOTE — Care Management Note (Signed)
Case Management Note  Patient Details  Name: [name] MRN: [id number] Date of Birth: May 30, 1951         Pt with failed extubation and has been re-intubated yesterday. Appropriate for LTAC. CM has sent referrals to [facility]. Both have bed available, family has Research officer, political party. Pt to be discharged to [facility] today.             Expected Discharge Date:  08/22/17               Expected Discharge Plan:  Long Term Acute Care (LTAC)  In-House Referral:  NA  Discharge planning Services  CM Consult  Post Acute Care Choice:  Long Term Acute Care (LTAC) Choice offered to:  Adult Children  Status of Service:  Completed, signed off  If discussed at Long Length of Stay Meetings, dates discussed:  08/22/17  Additional Comments:  Malcolm Metro, RN 08/22/2017, 11:20 AM

## 2017-08-22 NOTE — Addendum Note (Signed)
Addendum  created 08/22/17 0709 by Franco Nones, CRNA   Charge Capture section accepted

## 2017-08-22 NOTE — Care Management Important Message (Signed)
Important Message  Patient Details  Name: SYLVAN HOSTEN MRN: [id number] Date of Birth: April 16, 1951   Medicare Important Message Given:  Yes    Malcolm Metro, RN 08/22/2017, 11:01 AM

## 2017-08-22 NOTE — Progress Notes (Signed)
Patient BG this AM at 0825 was 52, Dr. Arbutus Leas made aware. OG residual checked and tube feeding increased to 55ml/hr per orders. Patient was alert, with eyes open and moving all extremities appearing restless and agitated. @0821  Versed increased by 0.5 due to increased restlessness with no positive effect. Patient continued to kick feet off the side of the bed and was waving arms in the air and attempting to roll on her side so @0825  PRN fentanyl given with little effect. Writer bathed patient and repositioned her in the bed with pillows which helped to decrease agitation and restlessness for a little while. @0946 , patient began to have increase in restlessness and agitation once again to which repositioning was ineffective so Fentanyl drip was increased and versed was increased per orders which was effective. Patient appears to be resting comfortably with no signs or symptoms of distress, restlessness or agitation. Family at bedside and supportive. Nurse to nurse report called and given to [place] RN at [employer]. Paperwork printed and prescriptions put in yellow folder and given to [facility]. Family took belongings home.

## 2017-08-23 ENCOUNTER — Other Ambulatory Visit (HOSPITAL_COMMUNITY): Payer: Self-pay

## 2017-08-23 LAB — URINALYSIS, ROUTINE W REFLEX MICROSCOPIC
BILIRUBIN URINE: NEGATIVE
Ketones, ur: NEGATIVE mg/dL
NITRITE: NEGATIVE
PH: 6 (ref 5.0–8.0)
Protein, ur: 30 mg/dL — AB
SPECIFIC GRAVITY, URINE: 1.022 (ref 1.005–1.030)

## 2017-08-23 LAB — COMPREHENSIVE METABOLIC PANEL
ALBUMIN: 2.4 g/dL — AB (ref 3.5–5.0)
ALK PHOS: 52 U/L (ref 38–126)
ALT: 65 U/L — ABNORMAL HIGH (ref 14–54)
ANION GAP: 13 (ref 5–15)
AST: 90 U/L — ABNORMAL HIGH (ref 15–41)
BUN: 35 mg/dL — ABNORMAL HIGH (ref 6–20)
CALCIUM: 8.9 mg/dL (ref 8.9–10.3)
CO2: 33 mmol/L — ABNORMAL HIGH (ref 22–32)
Chloride: 102 mmol/L (ref 101–111)
Creatinine, Ser: 1.03 mg/dL — ABNORMAL HIGH (ref 0.44–1.00)
GFR calc non Af Amer: 55 mL/min — ABNORMAL LOW (ref >60)
Glucose, Bld: 280 mg/dL — ABNORMAL HIGH (ref 65–99)
POTASSIUM: 3.9 mmol/L (ref 3.5–5.1)
Sodium: 148 mmol/L — ABNORMAL HIGH (ref 135–145)
TOTAL PROTEIN: 6.1 g/dL — AB (ref 6.5–8.1)
Total Bilirubin: 0.8 mg/dL (ref 0.3–1.2)

## 2017-08-23 LAB — HEMOGLOBIN A1C
HEMOGLOBIN A1C: 9.1 % — AB (ref 4.8–5.6)
Mean Plasma Glucose: 214.47 mg/dL

## 2017-08-23 LAB — CBC WITH DIFFERENTIAL/PLATELET
BASOS ABS: 0 10*3/uL (ref 0.0–0.1)
Basophils Relative: 0 %
Eosinophils Absolute: 0 10*3/uL (ref 0.0–0.7)
Eosinophils Relative: 0 %
HEMATOCRIT: 33.9 % — AB (ref 36.0–46.0)
HEMOGLOBIN: 10.3 g/dL — AB (ref 12.0–15.0)
Lymphocytes Relative: 20 %
Lymphs Abs: 2.1 10*3/uL (ref 0.7–4.0)
MCH: 31.5 pg (ref 26.0–34.0)
MCHC: 30.4 g/dL (ref 30.0–36.0)
MCV: 103.7 fL — ABNORMAL HIGH (ref 78.0–100.0)
MONOS PCT: 7 %
Monocytes Absolute: 0.8 10*3/uL (ref 0.1–1.0)
NEUTROS ABS: 7.5 10*3/uL (ref 1.7–7.7)
NEUTROS PCT: 73 %
Platelets: 198 10*3/uL (ref 150–400)
RBC: 3.27 MIL/uL — AB (ref 3.87–5.11)
RDW: 15.3 % (ref 11.5–15.5)
WBC: 10.4 10*3/uL (ref 4.0–10.5)

## 2017-08-23 LAB — PHOSPHORUS: PHOSPHORUS: 3.3 mg/dL (ref 2.5–4.6)

## 2017-08-23 LAB — MAGNESIUM: Magnesium: 2.2 mg/dL (ref 1.7–2.4)

## 2017-08-23 LAB — PROTIME-INR
INR: 1.57
PROTHROMBIN TIME: 18.6 s — AB (ref 11.4–15.2)

## 2017-08-23 LAB — C DIFFICILE QUICK SCREEN W PCR REFLEX
C DIFFICILE (CDIFF) INTERP: NOT DETECTED
C DIFFICILE (CDIFF) TOXIN: NEGATIVE
C Diff antigen: NEGATIVE

## 2017-08-23 LAB — TSH: TSH: 2.066 u[IU]/mL (ref 0.350–4.500)

## 2017-08-24 ENCOUNTER — Other Ambulatory Visit (HOSPITAL_COMMUNITY): Payer: Self-pay

## 2017-08-24 LAB — CK TOTAL AND CKMB (NOT AT ARMC)
CK TOTAL: 1026 U/L — AB (ref 38–234)
CK, MB: 2.7 ng/mL (ref 0.5–5.0)
CK, MB: 4 ng/mL (ref 0.5–5.0)
CK, MB: 2.5 ng/mL (ref 0.5–5.0)
RELATIVE INDEX: 0.2 (ref 0.0–2.5)
RELATIVE INDEX: 0.3 (ref 0.0–2.5)
Relative Index: 0.2 (ref 0.0–2.5)
Total CK: 1228 U/L — ABNORMAL HIGH (ref 38–234)
Total CK: 1259 U/L — ABNORMAL HIGH (ref 38–234)

## 2017-08-24 LAB — CBC
HEMATOCRIT: 31 % — AB (ref 36.0–46.0)
HEMOGLOBIN: 9.6 g/dL — AB (ref 12.0–15.0)
MCH: 31.3 pg (ref 26.0–34.0)
MCHC: 31 g/dL (ref 30.0–36.0)
MCV: 101 fL — AB (ref 78.0–100.0)
PLATELETS: 191 10*3/uL (ref 150–400)
RBC: 3.07 MIL/uL — AB (ref 3.87–5.11)
RDW: 15.2 % (ref 11.5–15.5)
WBC: 10.5 10*3/uL (ref 4.0–10.5)

## 2017-08-24 LAB — BASIC METABOLIC PANEL
Anion gap: 14 (ref 5–15)
BUN: 27 mg/dL — ABNORMAL HIGH (ref 6–20)
CHLORIDE: 102 mmol/L (ref 101–111)
CO2: 26 mmol/L (ref 22–32)
Calcium: 8.3 mg/dL — ABNORMAL LOW (ref 8.9–10.3)
Creatinine, Ser: 0.84 mg/dL (ref 0.44–1.00)
GFR calc Af Amer: >60 mL/min (ref >60)
GLUCOSE: 252 mg/dL — AB (ref 65–99)
POTASSIUM: 4.6 mmol/L (ref 3.5–5.1)
Sodium: 142 mmol/L (ref 135–145)

## 2017-08-24 LAB — BLOOD GAS, ARTERIAL
Acid-Base Excess: 11.7 mmol/L — ABNORMAL HIGH (ref 0.0–2.0)
Bicarbonate: 35.9 mmol/L — ABNORMAL HIGH (ref 20.0–28.0)
FIO2: 0.35
O2 Saturation: 95.7 %
PCO2 ART: 48.4 mmHg — AB (ref 32.0–48.0)
PEEP: 5 cmH2O
Patient temperature: 99.2
RATE: 16 resp/min
pH, Arterial: 7.485 — ABNORMAL HIGH (ref 7.350–7.450)
pO2, Arterial: 84.1 mmHg (ref 83.0–108.0)

## 2017-08-24 LAB — MAGNESIUM: Magnesium: 2.5 mg/dL — ABNORMAL HIGH (ref 1.7–2.4)

## 2017-08-24 LAB — URINE CULTURE

## 2017-08-24 LAB — TROPONIN I
TROPONIN I: 0.09 ng/mL — AB (ref <0.03)
Troponin I: 0.09 ng/mL (ref <0.03)
Troponin I: 0.07 ng/mL (ref <0.03)

## 2017-08-24 LAB — PHOSPHORUS: Phosphorus: 3 mg/dL (ref 2.5–4.6)

## 2017-08-25 ENCOUNTER — Other Ambulatory Visit (HOSPITAL_COMMUNITY): Payer: Self-pay

## 2017-08-25 LAB — BLOOD GAS, ARTERIAL
Acid-Base Excess: 9.1 mmol/L — ABNORMAL HIGH (ref 0.0–2.0)
Bicarbonate: 33.1 mmol/L — ABNORMAL HIGH (ref 20.0–28.0)
FIO2: 60
LHR: 16 {breaths}/min
O2 Saturation: 88.8 %
PEEP: 5 cmH2O
PO2 ART: 56.3 mmHg — AB (ref 83.0–108.0)
Patient temperature: 98.6
VT: 400 mL
pCO2 arterial: 44.6 mmHg (ref 32.0–48.0)
pH, Arterial: 7.483 — ABNORMAL HIGH (ref 7.350–7.450)

## 2017-08-25 LAB — RENAL FUNCTION PANEL
Albumin: 2.3 g/dL — ABNORMAL LOW (ref 3.5–5.0)
Anion gap: 14 (ref 5–15)
BUN: 19 mg/dL (ref 6–20)
CHLORIDE: 100 mmol/L — AB (ref 101–111)
CO2: 27 mmol/L (ref 22–32)
CREATININE: 0.93 mg/dL (ref 0.44–1.00)
Calcium: 8.4 mg/dL — ABNORMAL LOW (ref 8.9–10.3)
Glucose, Bld: 143 mg/dL — ABNORMAL HIGH (ref 65–99)
POTASSIUM: 3.1 mmol/L — AB (ref 3.5–5.1)
Phosphorus: 2.8 mg/dL (ref 2.5–4.6)
Sodium: 141 mmol/L (ref 135–145)

## 2017-08-25 LAB — CBC
HCT: 34.7 % — ABNORMAL LOW (ref 36.0–46.0)
Hemoglobin: 10.6 g/dL — ABNORMAL LOW (ref 12.0–15.0)
MCH: 31.5 pg (ref 26.0–34.0)
MCHC: 30.5 g/dL (ref 30.0–36.0)
MCV: 103 fL — ABNORMAL HIGH (ref 78.0–100.0)
PLATELETS: 181 10*3/uL (ref 150–400)
RBC: 3.37 MIL/uL — ABNORMAL LOW (ref 3.87–5.11)
RDW: 15.4 % (ref 11.5–15.5)
WBC: 14.1 10*3/uL — AB (ref 4.0–10.5)

## 2017-08-25 LAB — CK TOTAL AND CKMB (NOT AT ARMC)
CK TOTAL: 831 U/L — AB (ref 38–234)
CK, MB: 2.3 ng/mL (ref 0.5–5.0)
Relative Index: 0.3 (ref 0.0–2.5)

## 2017-08-25 LAB — CULTURE, RESPIRATORY W GRAM STAIN

## 2017-08-25 LAB — MAGNESIUM: Magnesium: 1.8 mg/dL (ref 1.7–2.4)

## 2017-08-25 LAB — CULTURE, RESPIRATORY: CULTURE: NORMAL

## 2017-08-25 LAB — TROPONIN I: TROPONIN I: 0.06 ng/mL — AB (ref <0.03)

## 2017-08-26 LAB — RENAL FUNCTION PANEL
Albumin: 2 g/dL — ABNORMAL LOW (ref 3.5–5.0)
Anion gap: 13 (ref 5–15)
BUN: 29 mg/dL — AB (ref 6–20)
CHLORIDE: 100 mmol/L — AB (ref 101–111)
CO2: 27 mmol/L (ref 22–32)
Calcium: 7.8 mg/dL — ABNORMAL LOW (ref 8.9–10.3)
Creatinine, Ser: 1.2 mg/dL — ABNORMAL HIGH (ref 0.44–1.00)
GFR calc Af Amer: 53 mL/min — ABNORMAL LOW (ref >60)
GFR calc non Af Amer: 45 mL/min — ABNORMAL LOW (ref >60)
GLUCOSE: 229 mg/dL — AB (ref 65–99)
POTASSIUM: 3.3 mmol/L — AB (ref 3.5–5.1)
Phosphorus: 2.9 mg/dL (ref 2.5–4.6)
Sodium: 140 mmol/L (ref 135–145)

## 2017-08-26 LAB — CBC
HEMATOCRIT: 30 % — AB (ref 36.0–46.0)
Hemoglobin: 9.3 g/dL — ABNORMAL LOW (ref 12.0–15.0)
MCH: 31 pg (ref 26.0–34.0)
MCHC: 31 g/dL (ref 30.0–36.0)
MCV: 100 fL (ref 78.0–100.0)
Platelets: 159 10*3/uL (ref 150–400)
RBC: 3 MIL/uL — ABNORMAL LOW (ref 3.87–5.11)
RDW: 15.4 % (ref 11.5–15.5)
WBC: 13.7 10*3/uL — ABNORMAL HIGH (ref 4.0–10.5)

## 2017-08-26 LAB — MAGNESIUM: MAGNESIUM: 2 mg/dL (ref 1.7–2.4)

## 2017-08-26 LAB — PHOSPHORUS: Phosphorus: 2.8 mg/dL (ref 2.5–4.6)

## 2017-08-27 ENCOUNTER — Other Ambulatory Visit (HOSPITAL_COMMUNITY): Payer: Self-pay

## 2017-08-27 LAB — RENAL FUNCTION PANEL
Albumin: 2 g/dL — ABNORMAL LOW (ref 3.5–5.0)
Anion gap: 12 (ref 5–15)
BUN: 29 mg/dL — ABNORMAL HIGH (ref 6–20)
CALCIUM: 8.1 mg/dL — AB (ref 8.9–10.3)
CHLORIDE: 101 mmol/L (ref 101–111)
CO2: 26 mmol/L (ref 22–32)
CREATININE: 1.11 mg/dL — AB (ref 0.44–1.00)
GFR, EST AFRICAN AMERICAN: 58 mL/min — AB (ref >60)
GFR, EST NON AFRICAN AMERICAN: 50 mL/min — AB (ref >60)
Glucose, Bld: 234 mg/dL — ABNORMAL HIGH (ref 65–99)
Phosphorus: 2.6 mg/dL (ref 2.5–4.6)
Potassium: 3.3 mmol/L — ABNORMAL LOW (ref 3.5–5.1)
SODIUM: 139 mmol/L (ref 135–145)

## 2017-08-27 LAB — CBC
HCT: 28.9 % — ABNORMAL LOW (ref 36.0–46.0)
HEMOGLOBIN: 9 g/dL — AB (ref 12.0–15.0)
MCH: 31.1 pg (ref 26.0–34.0)
MCHC: 31.1 g/dL (ref 30.0–36.0)
MCV: 100 fL (ref 78.0–100.0)
Platelets: 145 10*3/uL — ABNORMAL LOW (ref 150–400)
RBC: 2.89 MIL/uL — AB (ref 3.87–5.11)
RDW: 15.2 % (ref 11.5–15.5)
WBC: 12.1 10*3/uL — AB (ref 4.0–10.5)

## 2017-08-27 LAB — MAGNESIUM: MAGNESIUM: 2 mg/dL (ref 1.7–2.4)

## 2017-08-27 LAB — CULTURE, RESPIRATORY

## 2017-08-27 LAB — VANCOMYCIN, TROUGH
VANCOMYCIN TR: 25 ug/mL — AB (ref 15–20)
Vancomycin Tr: 36 ug/mL (ref 15–20)

## 2017-08-27 LAB — LACTIC ACID, PLASMA: LACTIC ACID, VENOUS: 2.1 mmol/L — AB (ref 0.5–1.9)

## 2017-08-27 LAB — CULTURE, RESPIRATORY W GRAM STAIN: Culture: NORMAL

## 2017-08-28 ENCOUNTER — Other Ambulatory Visit (HOSPITAL_COMMUNITY): Payer: Self-pay

## 2017-08-28 LAB — RENAL FUNCTION PANEL
ALBUMIN: 1.9 g/dL — AB (ref 3.5–5.0)
Anion gap: 8 (ref 5–15)
BUN: 23 mg/dL — AB (ref 6–20)
CO2: 25 mmol/L (ref 22–32)
Calcium: 8.1 mg/dL — ABNORMAL LOW (ref 8.9–10.3)
Chloride: 108 mmol/L (ref 101–111)
Creatinine, Ser: 0.86 mg/dL (ref 0.44–1.00)
Glucose, Bld: 105 mg/dL — ABNORMAL HIGH (ref 65–99)
POTASSIUM: 3.3 mmol/L — AB (ref 3.5–5.1)
Phosphorus: 2.3 mg/dL — ABNORMAL LOW (ref 2.5–4.6)
Sodium: 141 mmol/L (ref 135–145)

## 2017-08-28 LAB — CBC
HEMATOCRIT: 27.9 % — AB (ref 36.0–46.0)
Hemoglobin: 8.7 g/dL — ABNORMAL LOW (ref 12.0–15.0)
MCH: 31.1 pg (ref 26.0–34.0)
MCHC: 31.2 g/dL (ref 30.0–36.0)
MCV: 99.6 fL (ref 78.0–100.0)
Platelets: 133 10*3/uL — ABNORMAL LOW (ref 150–400)
RBC: 2.8 MIL/uL — AB (ref 3.87–5.11)
RDW: 15.3 % (ref 11.5–15.5)
WBC: 8.7 10*3/uL (ref 4.0–10.5)

## 2017-08-28 LAB — LACTIC ACID, PLASMA: Lactic Acid, Venous: 1.1 mmol/L (ref 0.5–1.9)

## 2017-08-28 LAB — BLOOD GAS, ARTERIAL
Acid-Base Excess: 6.4 mmol/L — ABNORMAL HIGH (ref 0.0–2.0)
Bicarbonate: 28.8 mmol/L — ABNORMAL HIGH (ref 20.0–28.0)
FIO2: 45
MECHVT: 450 mL
O2 Saturation: 98.1 %
PATIENT TEMPERATURE: 98.6
PCO2 ART: 29.6 mmHg — AB (ref 32.0–48.0)
PEEP: 5 cmH2O
RATE: 24 resp/min
pH, Arterial: 7.593 — ABNORMAL HIGH (ref 7.350–7.450)
pO2, Arterial: 104 mmHg (ref 83.0–108.0)

## 2017-08-28 LAB — VANCOMYCIN, TROUGH: VANCOMYCIN TR: 28 ug/mL — AB (ref 15–20)

## 2017-08-28 LAB — MAGNESIUM: MAGNESIUM: 2.1 mg/dL (ref 1.7–2.4)

## 2017-08-28 LAB — PHOSPHORUS: Phosphorus: 2.2 mg/dL — ABNORMAL LOW (ref 2.5–4.6)

## 2017-08-29 DIAGNOSIS — I5032 Chronic diastolic (congestive) heart failure: Secondary | ICD-10-CM

## 2017-08-29 DIAGNOSIS — E876 Hypokalemia: Secondary | ICD-10-CM

## 2017-08-29 DIAGNOSIS — J9601 Acute respiratory failure with hypoxia: Secondary | ICD-10-CM

## 2017-08-29 DIAGNOSIS — I480 Paroxysmal atrial fibrillation: Secondary | ICD-10-CM

## 2017-08-29 DIAGNOSIS — R001 Bradycardia, unspecified: Secondary | ICD-10-CM

## 2017-08-29 LAB — POTASSIUM: Potassium: 3.3 mmol/L — ABNORMAL LOW (ref 3.5–5.1)

## 2017-08-29 LAB — VANCOMYCIN, TROUGH: Vancomycin Tr: 10 ug/mL — ABNORMAL LOW (ref 15–20)

## 2017-08-29 NOTE — H&P (Signed)
PREOPERATIVE H&P  Chief Complaint: Respiratory failure  HPI: [name] is a 67 y.o. female who presents for evaluation of acute on chronic respiratory failure. Patient was initially admitted to the hospital over 3 weeks ago with pneumonia and trouble breathing. She was admitted to the ICU where she was intubated. She's had a previous history of congestive heart failure, COPD, type 2 diabetes, seizure disorder and A. fib. 3 attempts to extubate her were unsuccessful requiring reintubation. The last attempt at extubation was on February 18. She was subsequently transferred to [facility] hospital. Attempts to wean her off of the vent have been unsuccessful and I was consulted for placement of tracheostomy  Past Medical History:  Diagnosis Date  . Anemia    H/H of 10/30 with a normal MCV in 12/09  . Anxiety   . Arthritis   . Barrett's esophagus    Diagnosed 1995. Last EGD 2016-NO BARRETT'S.   . Chest pain    Negative cardiac catheterization in 2002; negative stress nuclear study in 2008  . Chronic anticoagulation   . Chronic diastolic CHF (congestive heart failure) (HCC)    a. EF predominantly normal during prior echoes but was 40% during 10/2014 echo. b. Most recent 01/2015 EF normal, 55-60%.  . Chronic LBP    Surgical intervention in 1996  . Diabetes mellitus, type 2 (HCC)    Insulin therapy; exacerbated by prednisone  . Dysrhythmia    AFib  . Gastroparesis    99% retention 05/2008 on GES  . GERD (gastroesophageal reflux disease)   . Hiatal hernia   . Hyperlipidemia   . Hypertension   . Hypothyroid   . IBS (irritable bowel syndrome)   . Obesity   . OSA on CPAP    had CPAP and cannot tolerate.  . Paroxysmal atrial fibrillation (HCC)   . Pulmonary hypertension (HCC) 01/2015   a. Predominantly pulmonary venous hypertension but may be component of PAH.  . Seizures (HCC)    last seizure was 2 years ago; on keppra for this; unknown etiology  . Syncope    a. Admitted 05/2009; magnetic  resonance imagin/ MRA - negative; etiology thought to be orthostasis secondary to drugs and dehydration. b. Syncope 02/2015 also felt 2/2 dehydration.   Past Surgical History:  Procedure Laterality Date  . BACK SURGERY  1996  . BIOPSY N/A 11/08/2013   Procedure: BIOPSY  / Tissue sampling / ulcers present in small intestine;  Surgeon: West Bali, MD;  [facility];  Service: Endoscopy;  Laterality: N/A;  . CARDIAC CATHETERIZATION  2002  . CARDIAC CATHETERIZATION N/A 01/26/2015   Procedure: Right Heart Cath;  Surgeon: Laurey Morale, MD;  Location: [facility] INVASIVE CV LAB;  Service: Cardiovascular;  Laterality: N/A;  . CARDIOVASCULAR STRESS TEST  2008   Stress nuclear study  . CARDIOVERSION N/A 03/06/2015   Procedure: CARDIOVERSION;  Surgeon: Laurey Morale, MD;  Location: [facility] ENDOSCOPY;  Service: Cardiovascular;  Laterality: N/A;  . CARPAL TUNNEL RELEASE  1994  . COLONOSCOPY  11/2011   Dr. Darrick Penna: Internal hemorrhoids, mild diverticulosis. Random colon biopsies negative.  . COLONOSCOPY N/A 11/08/2013   SLF: Normal mucosa in the terminal ileum/The colon IS redundant/  Moderate diverticulosis throughout the entire colon. ileum bx benign. colon bx benign  . ESOPHAGOGASTRODUODENOSCOPY  2008   Barrett's without dysplasia. esphagus dilated. antral erosions, h.pylori serologies negative.  . ESOPHAGOGASTRODUODENOSCOPY  11/2011   Dr. Darrick Penna: Barrett's esophagus, mild gastritis, diverticulum in the second portion of the duodenum  repeat EGD 3 years. Small bowel biopsies negative. Gastric biopsy show reactive gastropathy but no H. pylori. Esophageal biopsies consistent with GERD. Next EGD 11/2014  . ESOPHAGOGASTRODUODENOSCOPY N/A 11/21/2014   VWU:[id number] non-erosive gastritis/irregular z-line  . GIVENS CAPSULE STUDY  12/07/2011   Proximal small bowel, rare AVM. Distal small bowel, multiple ulcers noted  . GIVENS CAPSULE STUDY N/A 09/27/2013   Distal small bowel ulcers extending to TI.  Marland Kitchen GIVENS CAPSULE  STUDY N/A 10/10/2013   Procedure: GIVENS CAPSULE STUDY;  Surgeon: West Bali, MD;  Location: [facility];  Service: Endoscopy;  Laterality: N/A;  7:30  . KNEE ARTHROSCOPY WITH MEDIAL MENISECTOMY Right 06/09/2016   Procedure: KNEE ARTHROSCOPY WITH MEDIAL MENISECTOMY;  Surgeon: Vickki Hearing, MD;  Location: [facility] ORS;  Service: Orthopedics;  Laterality: Right;  medial and lateral menisectomy  . LAMINECTOMY  1995   L4-L5  . LAPAROSCOPIC CHOLECYSTECTOMY  1990s  . LEFT HEART CATHETERIZATION WITH CORONARY ANGIOGRAM  01/10/2014   Procedure: LEFT HEART CATHETERIZATION WITH CORONARY ANGIOGRAM;  Surgeon: Lesleigh Noe, MD;  Location: [facility] CATH LAB;  Service: Cardiovascular;;  . RIGHT HEART CATHETERIZATION N/A 01/10/2014   Procedure: RIGHT HEART CATH;  Surgeon: Lesleigh Noe, MD;  Location: [facility] CATH LAB;  Service: Cardiovascular;  Laterality: N/A;  . TOTAL ABDOMINAL HYSTERECTOMY  1999   Social History   Socioeconomic History  . Marital status: Married    Spouse name: Not on file  . Number of children: Not on file  . Years of education: Not on file  . Highest education level: Not on file  Social Needs  . Financial resource strain: Not on file  . Food insecurity - worry: Not on file  . Food insecurity - inability: Not on file  . Transportation needs - medical: Not on file  . Transportation needs - non-medical: Not on file  Occupational History  . Occupation: Passenger transport manager at [employer]    Employer: [employer]    Comment: disabled    Employer: RETIRED  Tobacco Use  . Smoking status: Former Smoker    Packs/day: 0.25    Years: 15.00    Pack years: 3.75    Types: Cigarettes    Start date: 02/26/1966    Last attempt to quit: 07/01/1983    Years since quitting: 34.1  . Smokeless tobacco: Never Used  . Tobacco comment: quit in 1984  Substance and Sexual Activity  . Alcohol use: No    Alcohol/week: 0.0 oz  . Drug use: No  . Sexual activity: Yes    Birth control/protection: None, Surgical   Other Topics Concern  . Not on file  Social History Narrative   Sedentary   4 children, "blended family"   Family History  Problem Relation Age of Onset  . Hypertension Mother   . Alzheimer's disease Mother   . Stroke Mother   . Heart attack Mother   . Hypertension Other   . Breast cancer Sister   . Heart disease Neg Hx   . Colon cancer Neg Hx    Allergies  Allergen Reactions  . Ibuprofen Other (See Comments)    Upsets her stomach and causes abdominal pain  . Codeine Nausea And Vomiting and Other (See Comments)    HALLUCINATIONS  . Dilaudid [Hydromorphone Hcl] Other (See Comments)    Made her pass out  . Celexa [Citalopram Hydrobromide] Other (See Comments)    Dyskinesia  . Reglan [Metoclopramide] Other (See Comments)    DYSKINESIA   Prior  to Admission medications   Medication Sig Start Date End Date Taking? Authorizing Provider  acetaminophen (TYLENOL) 500 MG tablet Take 500 mg by mouth every 6 (six) hours as needed for moderate pain or headache.    [provider]  amiodarone (PACERONE) 200 MG tablet Take 1 tablet (200 mg total) by mouth every morning. 03/24/17   Branch, Dorothe Pea, MD  apixaban (ELIQUIS) 5 MG TABS tablet Take 1 tablet (5 mg total) by mouth 2 (two) times daily. 06/09/16   Vickki Hearing, MD  fentaNYL 2,500 mcg in sodium chloride 0.9 % 200 mL Titrate 25-400 mcg/hour for sedation on mechanical ventilation 08/22/17   Tat, Onalee Hua, MD  furosemide (LASIX) 40 MG tablet TAKE 40 MG DAILY. 03/24/17   Antoine Poche, MD  gabapentin (NEURONTIN) 400 MG capsule Take 400-800 mg by mouth 3 (three) times daily. Take 1 capsule(400mg ) by mouth twice a day and take 2 capsules(800mg ) by mouth at bedtime 07/18/17   [provider]  insulin aspart (NOVOLOG) 100 UNIT/ML injection Inject 0-20 Units into the skin every 4 (four) hours. CBG 0-120--no units; 121-150--3 units; 151-200--4 units; 201-250--7units; 251-300--11 units; 301-350--15 units; 351-400--20 units  08/22/17   Tat, David, MD  insulin glargine (LANTUS) 100 UNIT/ML injection Inject 0.55 mLs (55 Units total) into the skin at bedtime. 08/22/17   Catarina Hartshorn, MD  ipratropium-albuterol (DUONEB) 0.5-2.5 (3) MG/3ML SOLN Take 3 mLs by nebulization every 6 (six) hours. 08/22/17   Catarina Hartshorn, MD  levETIRAcetam (KEPPRA) 1000 MG tablet Take 1,000 mg by mouth 2 (two) times daily. 07/18/17   [provider]  levothyroxine (SYNTHROID, LEVOTHROID) 137 MCG tablet Take 137 mcg by mouth daily. 10/14/14   [provider]  magnesium oxide (MAG-OX) 400 MG tablet Take 400 mg by mouth daily.     [provider]  Multiple Vitamin (MULTIVITAMIN WITH MINERALS) TABS tablet Take 1 tablet by mouth daily.    [provider]  Nutritional Supplements (FEEDING SUPPLEMENT, VITAL HIGH PROTEIN,) LIQD liquid Run continuous at 55 cc/hour 08/22/17   Tat, Onalee Hua, MD  pantoprazole (PROTONIX) 40 MG tablet TAKE ONE TABLET BY MOUTH TWICE DAILY BEFORE APPLY MEAL 02/07/17   Gelene Mink, NP  potassium chloride (K-DUR,KLOR-CON) 10 MEQ tablet TAKE TWO TABLETS BY MOUTH TWICE DAILY 04/20/17   Antoine Poche, MD     Positive ROS: neg  All other systems have been reviewed and were otherwise negative with the exception of those mentioned in the HPI and as above.  Physical Exam: There were no vitals filed for this visit.  General: Alert, and intubated on vent Nasal: Clear nasal passages Neck: No palpable adenopathy or thyroid nodules. Trachea midline without masses. Cardiovascular: IRRR  Respiratory: Clear to auscultation Neurologic: Alert and oriented x 3   Assessment/Plan: RESPRITORY FAILURE Plan for Procedure(s): TRACHEOSTOMY   Dillard Cannon, MD 08/29/2017 1:21 PM

## 2017-08-29 NOTE — Consult Note (Addendum)
Cardiology Consultation:   Patient ID: [name]; [id number]; 23-Aug-1950   Admit date: 08/22/2017 Date of Consult: 08/29/2017  Primary Care Provider: Gareth Morgan, MD Primary Cardiologist: Dina Rich, MD   Patient Profile:   [name] is a 67 y.o. female with a hx of chronic diastolic HF, Atrial fibrillation, chronic anticoagulation therapy, COPD, Pulmonary HTN, DM, HTN, OSA and hypothyroidism who was admitted to [facility] 08/05/17 for acute hypoxic respiratory failure, in the setting of CAP and acute on chronic diastolic CHF, requiring intubation. [name] was treated by internal medicine with antibiotics and diuretics. Attempt was made to extubate, however [name] require reintubation due to recurrent respiratory failure. [name] subsequently transferred to [facility] 08/22/17 for tracheostomy and gastrostomy tube placement and management. The patient is being seen today by cardiology for the evaluation of bradycardia at the request of Dr. Modena Jansky.   History of Present Illness:   [name] has been followed by Dr. Wyline Mood. She has known diastolic HF. Recent echo 08/17/17 showed normal LVEF at 55-60% with G2DD. She has no known coronary disease. Per records, she had a LHC in 2002 that showed no CAD. In 2008, she had a nuclear study that was negative. She has h/o pulmonary HTN. PA pressure in the past has been as high as 85 mmHg. In 2016, she had a RHC which demonstrated a mean pulmonary artery pressure of 52 mmHg. Recent echo 08/17/17 demonstrated a peak PA pressure of 57 mmHg. Additional history includes PAF, COPD, OSA, hypothyroidism, HTN and DM.   As outlined above, she was initially admitted to [facility] from 2/2-2/19/19 for acute hypoxic respiratory failure, in the setting of CAP and acute on chronic diastolic CHF, requiring intubation. [name] was treated by internal medicine with antibiotics and diuretics. Attempt was made to extubate, however [name] require reintubation due to recurrent respiratory  failure. [name] subsequently transferred to [facility] 08/22/17 for tracheostomy and gastrostomy tube placement and management.   Cardiology consulted for bradycardia. EKG shows sinus bradycardia, 50 bpm with 1st degree HB. Meds reviewed. She is has been on amiodarone, 200 mg daily, which can affect HR and cause bradycardia. [name]  also w/ history of hypothyroidism, treated with levothyroxine, however TSH recently checked 08/23/17 and was normal at 2.06. [name] mildly hypokalemic today at 3.3. Mg WNL at 2.1. Recent echo showed normal LVEF and wall motion.   [name] remains intubated but is is alert and appears comfortable. She nods yes and no to questions asked. She nods "no" when asked about any chest pain. Her husband and daughter are both by bedside. Her daughter notes that her HR drops whenever she is in a deep sleep. Current rates during my examination in the upper 50s low 60s. Paper chart also reviewed. [name] had telemetry strips documenting sinus brady with rates as low as the upper 30s.   [name] is scheduled to get tracheostomy on 09/01/17. Eliquis is now on hold. Amiodarone was discontinued 08/28/17 by primary MD.   Past Medical History:  Diagnosis Date  . Anemia    H/H of 10/30 with a normal MCV in 12/09  . Anxiety   . Arthritis   . Barrett's esophagus    Diagnosed 1995. Last EGD 2016-NO BARRETT'S.   . Chest pain    Negative cardiac catheterization in 2002; negative stress nuclear study in 2008  . Chronic anticoagulation   . Chronic diastolic CHF (congestive heart failure) (HCC)    a. EF predominantly normal during prior echoes but was 40% during  10/2014 echo. b. Most recent 01/2015 EF normal, 55-60%.  . Chronic LBP    Surgical intervention in 1996  . Diabetes mellitus, type 2 (HCC)    Insulin therapy; exacerbated by prednisone  . Dysrhythmia    AFib  . Gastroparesis    99% retention 05/2008 on GES  . GERD (gastroesophageal reflux disease)   . Hiatal hernia   . Hyperlipidemia   .  Hypertension   . Hypothyroid   . IBS (irritable bowel syndrome)   . Obesity   . OSA on CPAP    had CPAP and cannot tolerate.  . Paroxysmal atrial fibrillation (HCC)   . Pulmonary hypertension (HCC) 01/2015   a. Predominantly pulmonary venous hypertension but may be component of PAH.  . Seizures (HCC)    last seizure was 2 years ago; on keppra for this; unknown etiology  . Syncope    a. Admitted 05/2009; magnetic resonance imagin/ MRA - negative; etiology thought to be orthostasis secondary to drugs and dehydration. b. Syncope 02/2015 also felt 2/2 dehydration.    Past Surgical History:  Procedure Laterality Date  . BACK SURGERY  1996  . BIOPSY N/A 11/08/2013   Procedure: BIOPSY  / Tissue sampling / ulcers present in small intestine;  Surgeon: West Bali, MD;  Location: [facility];  Service: Endoscopy;  Laterality: N/A;  . CARDIAC CATHETERIZATION  2002  . CARDIAC CATHETERIZATION N/A 01/26/2015   Procedure: Right Heart Cath;  Surgeon: Laurey Morale, MD;  Location: [facility] INVASIVE CV LAB;  Service: Cardiovascular;  Laterality: N/A;  . CARDIOVASCULAR STRESS TEST  2008   Stress nuclear study  . CARDIOVERSION N/A 03/06/2015   Procedure: CARDIOVERSION;  Surgeon: Laurey Morale, MD;  Location: [facility] ENDOSCOPY;  Service: Cardiovascular;  Laterality: N/A;  . CARPAL TUNNEL RELEASE  1994  . COLONOSCOPY  11/2011   Dr. Darrick Penna: Internal hemorrhoids, mild diverticulosis. Random colon biopsies negative.  . COLONOSCOPY N/A 11/08/2013   SLF: Normal mucosa in the terminal ileum/The colon IS redundant/  Moderate diverticulosis throughout the entire colon. ileum bx benign. colon bx benign  . ESOPHAGOGASTRODUODENOSCOPY  2008   Barrett's without dysplasia. esphagus dilated. antral erosions, h.pylori serologies negative.  . ESOPHAGOGASTRODUODENOSCOPY  11/2011   Dr. Darrick Penna: Barrett's esophagus, mild gastritis, diverticulum in the second portion of the duodenum repeat EGD 3 years. Small bowel biopsies negative.  Gastric biopsy show reactive gastropathy but no H. pylori. Esophageal biopsies consistent with GERD. Next EGD 11/2014  . ESOPHAGOGASTRODUODENOSCOPY N/A 11/21/2014   DDU:KGUR non-erosive gastritis/irregular z-line  . GIVENS CAPSULE STUDY  12/07/2011   Proximal small bowel, rare AVM. Distal small bowel, multiple ulcers noted  . GIVENS CAPSULE STUDY N/A 09/27/2013   Distal small bowel ulcers extending to TI.  Marland Kitchen GIVENS CAPSULE STUDY N/A 10/10/2013   Procedure: GIVENS CAPSULE STUDY;  Surgeon: West Bali, MD;  Location: [facility];  Service: Endoscopy;  Laterality: N/A;  7:30  . KNEE ARTHROSCOPY WITH MEDIAL MENISECTOMY Right 06/09/2016   Procedure: KNEE ARTHROSCOPY WITH MEDIAL MENISECTOMY;  Surgeon: Vickki Hearing, MD;  Location: [facility] ORS;  Service: Orthopedics;  Laterality: Right;  medial and lateral menisectomy  . LAMINECTOMY  1995   L4-L5  . LAPAROSCOPIC CHOLECYSTECTOMY  1990s  . LEFT HEART CATHETERIZATION WITH CORONARY ANGIOGRAM  01/10/2014   Procedure: LEFT HEART CATHETERIZATION WITH CORONARY ANGIOGRAM;  Surgeon: Lesleigh Noe, MD;  Location: [facility] CATH LAB;  Service: Cardiovascular;;  . RIGHT HEART CATHETERIZATION N/A 01/10/2014   Procedure: RIGHT HEART CATH;  Surgeon: Lesleigh Noe, MD;  Location: [facility] CATH LAB;  Service: Cardiovascular;  Laterality: N/A;  . TOTAL ABDOMINAL HYSTERECTOMY  1999     Home Medications:  Prior to Admission medications   Medication Sig Start Date End Date Taking? Authorizing Provider  acetaminophen (TYLENOL) 500 MG tablet Take 500 mg by mouth every 6 (six) hours as needed for moderate pain or headache.    [provider]  amiodarone (PACERONE) 200 MG tablet Take 1 tablet (200 mg total) by mouth every morning. 03/24/17   Branch, Dorothe Pea, MD  apixaban (ELIQUIS) 5 MG TABS tablet Take 1 tablet (5 mg total) by mouth 2 (two) times daily. 06/09/16   Vickki Hearing, MD  fentaNYL 2,500 mcg in sodium chloride 0.9 % 200 mL Titrate 25-400 mcg/hour for  sedation on mechanical ventilation 08/22/17   Tat, Onalee Hua, MD  furosemide (LASIX) 40 MG tablet TAKE 40 MG DAILY. 03/24/17   Antoine Poche, MD  gabapentin (NEURONTIN) 400 MG capsule Take 400-800 mg by mouth 3 (three) times daily. Take 1 capsule(400mg ) by mouth twice a day and take 2 capsules(800mg ) by mouth at bedtime 07/18/17   [provider]  insulin aspart (NOVOLOG) 100 UNIT/ML injection Inject 0-20 Units into the skin every 4 (four) hours. CBG 0-120--no units; 121-150--3 units; 151-200--4 units; 201-250--7units; 251-300--11 units; 301-350--15 units; 351-400--20 units 08/22/17   Tat, David, MD  insulin glargine (LANTUS) 100 UNIT/ML injection Inject 0.55 mLs (55 Units total) into the skin at bedtime. 08/22/17   Catarina Hartshorn, MD  ipratropium-albuterol (DUONEB) 0.5-2.5 (3) MG/3ML SOLN Take 3 mLs by nebulization every 6 (six) hours. 08/22/17   Catarina Hartshorn, MD  levETIRAcetam (KEPPRA) 1000 MG tablet Take 1,000 mg by mouth 2 (two) times daily. 07/18/17   [provider]  levothyroxine (SYNTHROID, LEVOTHROID) 137 MCG tablet Take 137 mcg by mouth daily. 10/14/14   [provider]  magnesium oxide (MAG-OX) 400 MG tablet Take 400 mg by mouth daily.     [provider]  Multiple Vitamin (MULTIVITAMIN WITH MINERALS) TABS tablet Take 1 tablet by mouth daily.    [provider]  Nutritional Supplements (FEEDING SUPPLEMENT, VITAL HIGH PROTEIN,) LIQD liquid Run continuous at 55 cc/hour 08/22/17   Tat, Onalee Hua, MD  pantoprazole (PROTONIX) 40 MG tablet TAKE ONE TABLET BY MOUTH TWICE DAILY BEFORE APPLY MEAL 02/07/17   Gelene Mink, NP  potassium chloride (K-DUR,KLOR-CON) 10 MEQ tablet TAKE TWO TABLETS BY MOUTH TWICE DAILY 04/20/17   Antoine Poche, MD    Inpatient Medications: Scheduled Meds:  Continuous Infusions:  PRN Meds:   Allergies:    Allergies  Allergen Reactions  . Ibuprofen Other (See Comments)    Upsets her stomach and causes abdominal pain  . Codeine  Nausea And Vomiting and Other (See Comments)    HALLUCINATIONS  . Dilaudid [Hydromorphone Hcl] Other (See Comments)    Made her pass out  . Celexa [Citalopram Hydrobromide] Other (See Comments)    Dyskinesia  . Reglan [Metoclopramide] Other (See Comments)    DYSKINESIA    Social History:   Social History   Socioeconomic History  . Marital status: Married    Spouse name: Not on file  . Number of children: Not on file  . Years of education: Not on file  . Highest education level: Not on file  Social Needs  . Financial resource strain: Not on file  . Food insecurity - worry: Not on file  . Food insecurity - inability: Not  on file  . Transportation needs - medical: Not on file  . Transportation needs - non-medical: Not on file  Occupational History  . Occupation: Passenger transport manager at [employer]    Employer: [employer]    Comment: disabled    Employer: RETIRED  Tobacco Use  . Smoking status: Former Smoker    Packs/day: 0.25    Years: 15.00    Pack years: 3.75    Types: Cigarettes    Start date: 02/26/1966    Last attempt to quit: 07/01/1983    Years since quitting: 34.1  . Smokeless tobacco: Never Used  . Tobacco comment: quit in 1984  Substance and Sexual Activity  . Alcohol use: No    Alcohol/week: 0.0 oz  . Drug use: No  . Sexual activity: Yes    Birth control/protection: None, Surgical  Other Topics Concern  . Not on file  Social History Narrative   Sedentary   4 children, "blended family"    Family History:    Family History  Problem Relation Age of Onset  . Hypertension Mother   . Alzheimer's disease Mother   . Stroke Mother   . Heart attack Mother   . Hypertension Other   . Breast cancer Sister   . Heart disease Neg Hx   . Colon cancer Neg Hx      ROS:  Please see the history of present illness.   All other ROS reviewed and negative.     Physical Exam/Data:  There were no vitals filed for this visit. No intake or output data in the 24 hours ending 08/29/17  1258 There were no vitals filed for this visit. There is no height or weight on file to calculate BMI.  General:  Intubated by alert. No distress, obese HEENT: normal Lymph: no adenopathy Neck: no JVD Endocrine:  No thryomegaly Vascular: No carotid bruits; FA pulses 2+ bilaterally without bruits  Cardiac:  normal S1, S2; RRR; no murmur  Lungs:  Intubated clear to auscultation anteriorlly, no wheezing, rhonchi or rales  Abd: soft, nontender, no hepatomegaly  Ext: no edema Musculoskeletal:  No deformities, BUE and BLE strength normal and equal Skin: warm and dry  Neuro:  CNs 2-12 intact, no focal abnormalities noted Psych:  Normal affect   EKG:  The EKG was personally reviewed and demonstrates:  Sinus bradycardia, 1st degree HB Telemetry:  Telemetry was personally reviewed and demonstrates:  NSR currently, low 60s  Relevant CV Studies: 2D Echo 08/17/17 Study Conclusions  - Left ventricle: The cavity size was normal. Wall thickness was   increased in a pattern of moderate LVH. Systolic function was   normal. The estimated ejection fraction was in the range of 55%   to 60%. Wall motion was normal; there were no regional wall   motion abnormalities. Features are consistent with a pseudonormal   left ventricular filling pattern, with concomitant abnormal   relaxation and increased filling pressure (grade 2 diastolic   dysfunction). Doppler parameters are consistent with high   ventricular filling pressure. - Aortic valve: Mildly calcified annulus. Trileaflet; normal   thickness leaflets. Valve area (VTI): 2.6 cm^2. Valve area   (Vmax): 2.49 cm^2. - Mitral valve: Moderately calcified annulus. Normal thickness   leaflets . - Left atrium: The atrium was moderately to severely dilated. - Right ventricle: Poorly visualized. Grossly appears enlarged with   normal function. - Right atrium: The atrium was moderately dilated. - Pulmonary arteries: Systolic pressure was moderately  increased.   PA peak pressure:  57 mm Hg (S). - Technically difficult study.  Laboratory Data:  Chemistry Recent Labs  Lab 08/26/17 0646 08/27/17 0259 08/28/17 0345 08/29/17 0606  NA 140 139 141  --   K 3.3* 3.3* 3.3* 3.3*  CL 100* 101 108  --   CO2 27 26 25   --   GLUCOSE 229* 234* 105*  --   BUN 29* 29* 23*  --   CREATININE 1.20* 1.11* 0.86  --   CALCIUM 7.8* 8.1* 8.1*  --   GFRNONAA 45* 50* >60  --   GFRAA 53* 58* >60  --   ANIONGAP 13 12 8   --     Recent Labs  Lab 08/23/17 0737  08/26/17 0646 08/27/17 0259 08/28/17 0345  PROT 6.1*  --   --   --   --   ALBUMIN 2.4*   < > 2.0* 2.0* 1.9*  AST 90*  --   --   --   --   ALT 65*  --   --   --   --   ALKPHOS 52  --   --   --   --   BILITOT 0.8  --   --   --   --    < > = values in this interval not displayed.   Hematology Recent Labs  Lab 08/26/17 0646 08/27/17 0259 08/28/17 0345  WBC 13.7* 12.1* 8.7  RBC 3.00* 2.89* 2.80*  HGB 9.3* 9.0* 8.7*  HCT 30.0* 28.9* 27.9*  MCV 100.0 100.0 99.6  MCH 31.0 31.1 31.1  MCHC 31.0 31.1 31.2  RDW 15.4 15.2 15.3  PLT 159 145* 133*   Cardiac Enzymes Recent Labs  Lab 08/24/17 0944 08/24/17 1215 08/24/17 1813 08/24/17 2318  TROPONINI 0.09* 0.09* 0.07* 0.06*   No results for input(s): TROPIPOC in the last 168 hours.  BNPNo results for input(s): BNP, PROBNP in the last 168 hours.  DDimer No results for input(s): DDIMER in the last 168 hours.  Radiology/Studies:  Dg Chest Port 1 View  Result Date: 08/27/2017 CLINICAL DATA:  Respiratory failure EXAM: PORTABLE CHEST 1 VIEW COMPARISON:  08/25/2017 chest radiograph. FINDINGS: Endotracheal tube tip is 2.8 cm above the carina. Left PICC terminates over the left brachiocephalic vein near the junction with the superior vena cava. Enteric tube enters stomach with the tip not seen on this image. Stable cardiomediastinal silhouette with mild cardiomegaly. No pneumothorax. No significant pleural effusion. Mild pulmonary edema,  decreased. Left basilar atelectasis is stable. IMPRESSION: 1. Support structures as detailed. 2. Mild congestive heart failure, improved. 3. Stable left basilar atelectasis. Electronically Signed   By: Delbert Phenix M.D.   On: 08/27/2017 07:32   Dg Abd Portable 1v  Result Date: 08/28/2017 CLINICAL DATA:  Evaluate for ileus EXAM: PORTABLE ABDOMEN - 1 VIEW COMPARISON:  Plain film of the abdomen dated 08/25/2017. FINDINGS: There are no dilated large or small bowel loops seen. Gas is seen throughout the grossly nondistended colon. Enteric tube appears adequately positioned in the stomach. No evidence of free intraperitoneal air or abnormal fluid collection. Patchy consolidations noted at the left lung base, pneumonia versus atelectasis. IMPRESSION: Nonobstructive bowel gas pattern. No dilated bowel loops or air-fluid levels to suggest ileus. Enteric tube appears adequately positioned in the stomach. Electronically Signed   By: Bary Richard M.D.   On: 08/28/2017 18:52    Assessment and Plan:   DAVEAH VARONE is a 67 y.o. female with a hx of chronic diastolic HF, Atrial fibrillation, chronic anticoagulation  therapy, COPD, Pulmonary HTN, DM, HTN, OSA and hypothyroidism who was admitted to [facility] 08/05/17 for acute hypoxic respiratory failure, in the setting of CAP and acute on chronic diastolic CHF, requiring intubation. [name] was treated by internal medicine with antibiotics and diuretics. Attempt was made to extubate, however [name] require reintubation due to recurrent respiratory failure. [name] subsequently transferred to [facility] 08/22/17 for tracheostomy and gastrostomy tube placement and management. The patient is being seen today by cardiology for the evaluation of bradycardia at the request of Dr. Modena Jansky.    1. Sinus Bradycardia: TSH and Mg WNL. Mildly hypokalemic at 3.3, however supplementation has been given. Recent echo 08/17/17 showed normal LVEF and wall motion.  Per family report, [name] has not seemed  to have any significant symptoms. No CP ([name] nods "No" when asked). Bradycardia mainly occurs during sleep. Telemetry reviewed. Only sinus brady and no high grade heart block. [name] has been on amiodarone for atrial fibrillation, which can contribute to bradycardia, however given no high grade heart block and [name] has been asymptomatic, recommend continuation of amiodarone. Continue to monitor on tele. Also ? If increased vagal nerve stimulation from intubation. MD to follow with further recs.   2. H/o PAF: NSR/ SB on tele. Would continue amiodarone for rhythm control, despite bradycardia as outlined above. Eliquis on hold for planned tracheostomy 09/01/17.   3. Chronic Diastolic CHF: volume appears stable.   4. Hypoxic Respiratory Failure: in the setting of PNA. Failed multiple attempts at extubation. Plan is for tracheostomy 09/01/17.   For questions or updates, please contact [facility] Please consult [email or web address] for contact info under Cardiology/STEMI.   Signed, Robbie Lis, PA-C  08/29/2017 12:58 PM   I have examined the patient and reviewed assessment and plan and discussed with patient.  Agree with above as stated.  I personally reviewed the telemetry strips and ECG.  It appears the patient has had marked sinus bradycardia intermittently.  THere has not been any Mobitz type II heart block.    I suspect her bradycardia is stable for her.  Would restart her Amio to prevent any atrial fibrillation.  During the perioperative course, she may have some episodic bradycardia worsened with sedation.  No indication for pacemaker.  Continue to monitor.  D/w Dr. Jacqualyn Posey.  Lance Muss

## 2017-08-30 LAB — BASIC METABOLIC PANEL
ANION GAP: 9 (ref 5–15)
BUN: 19 mg/dL (ref 6–20)
CO2: 25 mmol/L (ref 22–32)
Calcium: 7.9 mg/dL — ABNORMAL LOW (ref 8.9–10.3)
Chloride: 109 mmol/L (ref 101–111)
Creatinine, Ser: 0.82 mg/dL (ref 0.44–1.00)
GFR calc Af Amer: >60 mL/min (ref >60)
GLUCOSE: 132 mg/dL — AB (ref 65–99)
POTASSIUM: 3.5 mmol/L (ref 3.5–5.1)
SODIUM: 143 mmol/L (ref 135–145)

## 2017-08-30 LAB — VANCOMYCIN, TROUGH: Vancomycin Tr: 20 ug/mL (ref 15–20)

## 2017-08-30 LAB — CULTURE, BLOOD (ROUTINE X 2)
Culture: NO GROWTH
Culture: NO GROWTH
SPECIAL REQUESTS: ADEQUATE

## 2017-08-30 LAB — CBC
HCT: 30 % — ABNORMAL LOW (ref 36.0–46.0)
Hemoglobin: 9.2 g/dL — ABNORMAL LOW (ref 12.0–15.0)
MCH: 31.2 pg (ref 26.0–34.0)
MCHC: 30.7 g/dL (ref 30.0–36.0)
MCV: 101.7 fL — ABNORMAL HIGH (ref 78.0–100.0)
PLATELETS: 169 10*3/uL (ref 150–400)
RBC: 2.95 MIL/uL — AB (ref 3.87–5.11)
RDW: 15.2 % (ref 11.5–15.5)
WBC: 6.3 10*3/uL (ref 4.0–10.5)

## 2017-08-30 LAB — MAGNESIUM: MAGNESIUM: 1.9 mg/dL (ref 1.7–2.4)

## 2017-08-30 NOTE — Progress Notes (Addendum)
Progress Note  Patient Name: [name] Date of Encounter: 08/30/2017  Primary Cardiologist: Dina Rich, MD  Subjective   No chest pain, resting comfortable in the bed. Family at bedside.   Inpatient Medications    Scheduled Meds:  Continuous Infusions:  PRN Meds:    Vital Signs    There were no vitals filed for this visit. No intake or output data in the 24 hours ending 08/30/17 0911 There were no vitals filed for this visit.  Telemetry    SB - Personally Reviewed  ECG    N/a - Personally Reviewed  Physical Exam  \ General: Well developed, well nourished, obese older W female appearing in no acute distress. Head: Normocephalic, atraumatic.  Neck: Supple, difficulty to assess JVD given neck girth. Lungs:  Resp regular and unlabored, diminished bilaterally. Heart: SB, S1, S2, no S3, S4, soft systolic murmur; no rub. Abdomen: Soft, non-tender, non-distended with normoactive bowel sounds.  Extremities: No clubbing, cyanosis, edema. Distal pedal pulses are 2+ bilaterally. Neuro: Alert and oriented X 3. Moves all extremities spontaneously. Psych: Normal affect.  Labs    Chemistry Recent Labs  Lab 08/26/17 [phone] 08/27/17 0259 08/28/17 0345 08/29/17 0606 08/30/17 0614  NA 140 139 141  --  143  K 3.3* 3.3* 3.3* 3.3* 3.5  CL 100* 101 108  --  109  CO2 27 26 25   --  25  GLUCOSE 229* 234* 105*  --  132*  BUN 29* 29* 23*  --  19  CREATININE 1.20* 1.11* 0.86  --  0.82  CALCIUM 7.8* 8.1* 8.1*  --  7.9*  ALBUMIN 2.0* 2.0* 1.9*  --   --   GFRNONAA 45* 50* >60  --  >60  GFRAA 53* 58* >60  --  >60  ANIONGAP 13 12 8   --  9     Hematology Recent Labs  Lab 08/27/17 0259 08/28/17 0345 08/30/17 0614  WBC 12.1* 8.7 6.3  RBC 2.89* 2.80* 2.95*  HGB 9.0* 8.7* 9.2*  HCT 28.9* 27.9* 30.0*  MCV 100.0 99.6 101.7*  MCH 31.1 31.1 31.2  MCHC 31.1 31.2 30.7  RDW 15.2 15.3 15.2  PLT 145* 133* 169    Cardiac Enzymes Recent Labs  Lab 08/24/17 0944  08/24/17 1215 08/24/17 1813 08/24/17 2318  TROPONINI 0.09* 0.09* 0.07* 0.06*   No results for input(s): TROPIPOC in the last 168 hours.   BNPNo results for input(s): BNP, PROBNP in the last 168 hours.   DDimer No results for input(s): DDIMER in the last 168 hours.    Radiology    Dg Abd Portable 1v  Result Date: 08/28/2017 CLINICAL DATA:  Evaluate for ileus EXAM: PORTABLE ABDOMEN - 1 VIEW COMPARISON:  Plain film of the abdomen dated 08/25/2017. FINDINGS: There are no dilated large or small bowel loops seen. Gas is seen throughout the grossly nondistended colon. Enteric tube appears adequately positioned in the stomach. No evidence of free intraperitoneal air or abnormal fluid collection. Patchy consolidations noted at the left lung base, pneumonia versus atelectasis. IMPRESSION: Nonobstructive bowel gas pattern. No dilated bowel loops or air-fluid levels to suggest ileus. Enteric tube appears adequately positioned in the stomach. Electronically Signed   By: Bary Richard M.D.   On: 08/28/2017 18:52    Cardiac Studies   N/a   Patient Profile     67 y.o. female with a hx of chronic diastolic HF, Atrial fibrillation, chronic anticoagulation therapy, COPD, Pulmonary HTN, DM, HTN, OSA and hypothyroidism who was  admitted to [facility] 08/05/17 for acute hypoxic respiratory failure, in the setting of CAP and acute on chronic diastolic CHF, requiring intubation. Pt was treated by internal medicine with antibiotics and diuretics. Attempt was made to extubate, however pt require reintubation due to recurrent respiratory failure. Pt subsequently transferred to [facility] 08/22/17 for tracheostomy and gastrostomy tube placement and management.   Assessment & Plan    1. Sinus Bradycardia: Mg 1.7 today. Hypokalemia resolved today. HR ranging between 50-60s. Strips in chart reviewed without any noted high grade HB. Continue to follow on telemetry. Recent echo 08/17/17 showed normal LVEF and wall  motion.    2. H/o PAF: NSR/ SB on tele. Amiodarone was held 2/2 to concerns over bradycardia, but ordered to resume this morning. Will like to avoid any recurrence of Afib given her OAC is on hold for planned trach on 3/1.   3. Chronic Diastolic CHF: volume appears stable.   4. Hypoxic Respiratory Failure: in the setting of PNA. Failed multiple attempts at extubation. Plan is for tracheostomy 09/01/17.   5. Hypokalemia: Resolved.   6. Anemia: stable at 9.2 today  Signed, Laverda Page, NP  08/30/2017, 9:11 AM  Pager # [phone]   I have examined the patient and reviewed assessment and plan and discussed with patient.  Agree with above as stated.  HR has been stable.  I personally reviewed telemetry.  COntinue Amiodarone.  Anticoagulation is on hold due to upcoming surgery.  Please call with questions.   Lance Muss   For questions or updates, please contact [facility] Please consult [email or web address] for contact info under Cardiology/STEMI.

## 2017-08-31 NOTE — Anesthesia Preprocedure Evaluation (Addendum)
Anesthesia Evaluation  Patient identified by MRN, date of birth, ID band Patient awake    Reviewed: Allergy & Precautions, H&P , NPO status , Patient's Chart, lab work & pertinent test results  Airway Mallampati: Intubated  TM Distance: >3 FB     Dental  (+) Edentulous Upper, Edentulous Lower, Upper Dentures, Lower Dentures, Dental Advisory Given   Pulmonary shortness of breath, with exertion and Long-Term Oxygen Therapy, sleep apnea , former smoker,  (-) CPAP   Pulmonary exam normal breath sounds clear to auscultation       Cardiovascular hypertension, Pt. on medications (-) angina+CHF ( Chronic diastolic CHF and pulm htn)  Normal cardiovascular exam+ dysrhythmias (s/p DCCV) Atrial Fibrillation  Rhythm:Irregular Rate:Normal     Neuro/Psych Seizures -,  PSYCHIATRIC DISORDERS Anxiety claustrophobia Last seizure 6 years ago (+Keppra)    GI/Hepatic hiatal hernia, GERD  Medicated,  Endo/Other  diabetes, Well Controlled, Type 2, Insulin DependentHypothyroidism   Renal/GU      Musculoskeletal   Abdominal   Peds  Hematology   Anesthesia Other Findings RA sat=85% on arrival to preop.  Reproductive/Obstetrics                            Anesthesia Physical  Anesthesia Plan  ASA: III  Anesthesia Plan: General   Post-op Pain Management:    Induction: Intravenous  PONV Risk Score and Plan: 3 and Treatment may vary due to age or medical condition and Ondansetron  Airway Management Planned: Oral ETT and LMA  Additional Equipment:   Intra-op Plan:   Post-operative Plan: Possible Post-op intubation/ventilation  Informed Consent: I have reviewed the patients History and Physical, chart, labs and discussed the procedure including the risks, benefits and alternatives for the proposed anesthesia with the patient or authorized representative who has indicated his/her understanding and acceptance.      Plan Discussed with: Anesthesiologist and CRNA  Anesthesia Plan Comments: (  )        Anesthesia Quick Evaluation

## 2017-09-01 ENCOUNTER — Encounter (HOSPITAL_COMMUNITY)
Admission: RE | Disposition: A | Discharge status: Nursing Home (Includes ICF) | Payer: Self-pay | Source: Other Acute Inpatient Hospital | Attending: Internal Medicine

## 2017-09-01 ENCOUNTER — Encounter: Payer: Self-pay | Admitting: Certified Registered"

## 2017-09-01 ENCOUNTER — Encounter (HOSPITAL_COMMUNITY): Payer: Self-pay | Admitting: Anesthesiology

## 2017-09-01 ENCOUNTER — Other Ambulatory Visit (HOSPITAL_COMMUNITY): Payer: Self-pay

## 2017-09-01 ENCOUNTER — Inpatient Hospital Stay
Admission: RE | Admit: 2017-09-01 | Discharge: 2017-09-15 | Disposition: A | Discharge status: Home Health | Payer: Self-pay

## 2017-09-01 DIAGNOSIS — Z0189 Encounter for other specified special examinations: Secondary | ICD-10-CM

## 2017-09-01 HISTORY — PX: TRACHEOSTOMY TUBE PLACEMENT: SHX814

## 2017-09-01 LAB — BASIC METABOLIC PANEL
ANION GAP: 10 (ref 5–15)
BUN: 14 mg/dL (ref 6–20)
CALCIUM: 8.2 mg/dL — AB (ref 8.9–10.3)
CO2: 28 mmol/L (ref 22–32)
Chloride: 103 mmol/L (ref 101–111)
Creatinine, Ser: 0.73 mg/dL (ref 0.44–1.00)
GFR calc Af Amer: >60 mL/min (ref >60)
GFR calc non Af Amer: >60 mL/min (ref >60)
GLUCOSE: 120 mg/dL — AB (ref 65–99)
Potassium: 3 mmol/L — ABNORMAL LOW (ref 3.5–5.1)
Sodium: 141 mmol/L (ref 135–145)

## 2017-09-01 LAB — CBC
HEMATOCRIT: 33.8 % — AB (ref 36.0–46.0)
Hemoglobin: 10.5 g/dL — ABNORMAL LOW (ref 12.0–15.0)
MCH: 31 pg (ref 26.0–34.0)
MCHC: 31.1 g/dL (ref 30.0–36.0)
MCV: 99.7 fL (ref 78.0–100.0)
Platelets: 223 10*3/uL (ref 150–400)
RBC: 3.39 MIL/uL — ABNORMAL LOW (ref 3.87–5.11)
RDW: 15.3 % (ref 11.5–15.5)
WBC: 8.4 10*3/uL (ref 4.0–10.5)

## 2017-09-01 SURGERY — CREATION, TRACHEOSTOMY
Anesthesia: General

## 2017-09-01 MED ORDER — 0.9 % SODIUM CHLORIDE (POUR BTL) OPTIME
TOPICAL | Status: DC | PRN
  Administered 2017-09-01: 1000 mL

## 2017-09-01 MED ORDER — PROPOFOL 10 MG/ML IV BOLUS
INTRAVENOUS | Status: AC
  Filled 2017-09-01: qty 20

## 2017-09-01 MED ORDER — LIDOCAINE 2% (20 MG/ML) 5 ML SYRINGE
INTRAMUSCULAR | Status: AC
  Filled 2017-09-01: qty 5

## 2017-09-01 MED ORDER — PHENYLEPHRINE 40 MCG/ML (10ML) SYRINGE FOR IV PUSH (FOR BLOOD PRESSURE SUPPORT)
PREFILLED_SYRINGE | INTRAVENOUS | Status: AC
  Filled 2017-09-01: qty 10

## 2017-09-01 MED ORDER — ROCURONIUM BROMIDE 10 MG/ML (PF) SYRINGE
PREFILLED_SYRINGE | INTRAVENOUS | Status: DC | PRN
  Administered 2017-09-01: 10 mg via INTRAVENOUS
  Administered 2017-09-01: 50 mg via INTRAVENOUS

## 2017-09-01 MED ORDER — FENTANYL CITRATE (PF) 100 MCG/2ML IJ SOLN
INTRAMUSCULAR | Status: DC | PRN
  Administered 2017-09-01 (×2): 50 ug via INTRAVENOUS

## 2017-09-01 MED ORDER — FENTANYL CITRATE (PF) 250 MCG/5ML IJ SOLN
INTRAMUSCULAR | Status: AC
  Filled 2017-09-01: qty 5

## 2017-09-01 MED ORDER — MIDAZOLAM HCL 2 MG/2ML IJ SOLN
INTRAMUSCULAR | Status: DC | PRN
  Administered 2017-09-01: 2 mg via INTRAVENOUS

## 2017-09-01 MED ORDER — LIDOCAINE-EPINEPHRINE 1 %-1:100000 IJ SOLN
INTRAMUSCULAR | Status: AC
  Filled 2017-09-01: qty 1

## 2017-09-01 MED ORDER — EPHEDRINE SULFATE-NACL 50-0.9 MG/10ML-% IV SOSY
PREFILLED_SYRINGE | INTRAVENOUS | Status: DC | PRN
  Administered 2017-09-01 (×3): 10 mg via INTRAVENOUS

## 2017-09-01 MED ORDER — LACTATED RINGERS IV SOLN
INTRAVENOUS | Status: DC | PRN
  Administered 2017-09-01: 08:00:00 via INTRAVENOUS

## 2017-09-01 MED ORDER — ONDANSETRON HCL 4 MG/2ML IJ SOLN
INTRAMUSCULAR | Status: AC
  Filled 2017-09-01: qty 2

## 2017-09-01 MED ORDER — ROCURONIUM BROMIDE 10 MG/ML (PF) SYRINGE
PREFILLED_SYRINGE | INTRAVENOUS | Status: AC
Start: 2017-09-01 — End: 2017-09-01
  Filled 2017-09-01: qty 5

## 2017-09-01 MED ORDER — MIDAZOLAM HCL 2 MG/2ML IJ SOLN
INTRAMUSCULAR | Status: AC
  Filled 2017-09-01: qty 2

## 2017-09-01 MED ORDER — PHENYLEPHRINE 40 MCG/ML (10ML) SYRINGE FOR IV PUSH (FOR BLOOD PRESSURE SUPPORT)
PREFILLED_SYRINGE | INTRAVENOUS | Status: DC | PRN
  Administered 2017-09-01: 40 ug via INTRAVENOUS
  Administered 2017-09-01: 120 ug via INTRAVENOUS
  Administered 2017-09-01: 80 ug via INTRAVENOUS

## 2017-09-01 MED ORDER — ONDANSETRON HCL 4 MG/2ML IJ SOLN
INTRAMUSCULAR | Status: DC | PRN
  Administered 2017-09-01: 4 mg via INTRAVENOUS

## 2017-09-01 MED ORDER — EPHEDRINE 5 MG/ML INJ
INTRAVENOUS | Status: AC
  Filled 2017-09-01: qty 10

## 2017-09-01 MED ORDER — LIDOCAINE-EPINEPHRINE 1 %-1:100000 IJ SOLN
INTRAMUSCULAR | Status: DC | PRN
  Administered 2017-09-01: 7 mL

## 2017-09-01 SURGICAL SUPPLY — 39 items
ATTRACTOMAT 16X20 MAGNETIC DRP (DRAPES) IMPLANT
BLADE SURG 15 STRL LF DISP TIS (BLADE) ×1 IMPLANT
BLADE SURG 15 STRL SS (BLADE) ×3
CLEANER TIP ELECTROSURG 2X2 (MISCELLANEOUS) ×3 IMPLANT
COVER SURGICAL LIGHT HANDLE (MISCELLANEOUS) ×3 IMPLANT
DRAPE HALF SHEET 40X57 (DRAPES) IMPLANT
ELECT COATED BLADE 2.86 ST (ELECTRODE) ×3 IMPLANT
ELECT REM PT RETURN 9FT ADLT (ELECTROSURGICAL) ×3
ELECTRODE REM PT RTRN 9FT ADLT (ELECTROSURGICAL) ×1 IMPLANT
GAUZE SPONGE 4X4 16PLY XRAY LF (GAUZE/BANDAGES/DRESSINGS) ×3 IMPLANT
GEL ULTRASOUND 20GR AQUASONIC (MISCELLANEOUS) ×3 IMPLANT
GLOVE SS BIOGEL STRL SZ 7.5 (GLOVE) ×1 IMPLANT
GLOVE SUPERSENSE BIOGEL SZ 7.5 (GLOVE) ×2
GOWN STRL REUS W/ TWL LRG LVL3 (GOWN DISPOSABLE) ×1 IMPLANT
GOWN STRL REUS W/ TWL XL LVL3 (GOWN DISPOSABLE) ×1 IMPLANT
GOWN STRL REUS W/TWL LRG LVL3 (GOWN DISPOSABLE) ×3
GOWN STRL REUS W/TWL XL LVL3 (GOWN DISPOSABLE) ×3
HOLDER TRACH TUBE VELCRO 19.5 (MISCELLANEOUS) ×3 IMPLANT
KIT BASIN OR (CUSTOM PROCEDURE TRAY) ×3 IMPLANT
KIT ROOM TURNOVER OR (KITS) ×3 IMPLANT
KIT SUCTION CATH 14FR (SUCTIONS) ×3 IMPLANT
NDL HYPO 25GX1X1/2 BEV (NEEDLE) ×1 IMPLANT
NEEDLE HYPO 25GX1X1/2 BEV (NEEDLE) ×3 IMPLANT
NS IRRIG 1000ML POUR BTL (IV SOLUTION) ×3 IMPLANT
PACK EENT II TURBAN DRAPE (CUSTOM PROCEDURE TRAY) ×3 IMPLANT
PAD ARMBOARD 7.5X6 YLW CONV (MISCELLANEOUS) IMPLANT
PENCIL BUTTON HOLSTER BLD 10FT (ELECTRODE) ×3 IMPLANT
SPONGE DRAIN TRACH 4X4 STRL 2S (GAUZE/BANDAGES/DRESSINGS) ×3 IMPLANT
SPONGE INTESTINAL PEANUT (DISPOSABLE) ×3 IMPLANT
SUT SILK 2 0 SH CR/8 (SUTURE) ×3 IMPLANT
SUT SILK 3 0 TIES 10X30 (SUTURE) IMPLANT
SYR 5ML LUER SLIP (SYRINGE) ×3 IMPLANT
SYR CONTROL 10ML LL (SYRINGE) ×3 IMPLANT
TOWEL OR 17X24 6PK STRL BLUE (TOWEL DISPOSABLE) ×3 IMPLANT
TOWEL OR 17X26 10 PK STRL BLUE (TOWEL DISPOSABLE) ×3 IMPLANT
TUBE CONNECTING 12'X1/4 (SUCTIONS) ×1
TUBE CONNECTING 12X1/4 (SUCTIONS) ×2 IMPLANT
TUBE TRACH SHILEY  6 DIST  CUF (TUBING) ×2 IMPLANT
TUBE TRACH SHILEY 8 DIST CUF (TUBING) IMPLANT

## 2017-09-01 NOTE — Interval H&P Note (Signed)
History and Physical Interval Note:  09/01/2017 7:35 AM  [name]  has presented today for surgery, with the diagnosis of RESPRITORY FAILURE  The various methods of treatment have been discussed with the patient and family. After consideration of risks, benefits and other options for treatment, the patient has consented to  Procedure(s): TRACHEOSTOMY (N/A) as a surgical intervention .  The patient's history has been reviewed, patient examined, no change in status, stable for surgery.  I have reviewed the patient's chart and labs.  Questions were answered to the patient's satisfaction.     Dillard Cannon

## 2017-09-01 NOTE — Anesthesia Postprocedure Evaluation (Signed)
Anesthesia Post Note  Patient: [name]  Procedure(s) Performed: TRACHEOSTOMY (N/A )     Patient location during evaluation: PACU Anesthesia Type: General Level of consciousness: awake and alert Pain management: pain level controlled Vital Signs Assessment: post-procedure vital signs reviewed and stable Respiratory status: spontaneous breathing, nonlabored ventilation, respiratory function stable and patient connected to nasal cannula oxygen Cardiovascular status: blood pressure returned to baseline and stable Postop Assessment: no apparent nausea or vomiting Anesthetic complications: no    Last Vitals: There were no vitals filed for this visit.  Last Pain: There were no vitals filed for this visit.               Obdulio Mash

## 2017-09-01 NOTE — Anesthesia Procedure Notes (Signed)
Date/Time: 09/01/2017 7:49 AM Performed by: De Nurse, CRNA Pre-anesthesia Checklist: Patient identified, Emergency Drugs available, Suction available, Patient being monitored and Timeout performed Patient Re-evaluated:Patient Re-evaluated prior to induction Oxygen Delivery Method: Circle system utilized

## 2017-09-01 NOTE — Brief Op Note (Signed)
09/01/2017  8:29 AM  PATIENT:  [name]  67 y.o. female  PRE-OPERATIVE DIAGNOSIS:  RESPRITORY FAILURE  POST-OPERATIVE DIAGNOSIS:  RESPRITORY FAILURE  PROCEDURE:  Procedure(s): TRACHEOSTOMY (N/A)  Shiley # 6  SURGEON:  Surgeon(s) and Role:    * Drema Halon, MD - Primary  PHYSICIAN ASSISTANT:   ASSISTANTS: none   ANESTHESIA:   general  EBL:  minimal   BLOOD ADMINISTERED:none  DRAINS: none   LOCAL MEDICATIONS USED:  XYLOCAINE with EPI 7 cc  SPECIMEN:  No Specimen  DISPOSITION OF SPECIMEN:  N/A  COUNTS:  YES  TOURNIQUET:  * No tourniquets in log *  DICTATION: .Other Dictation: Dictation Number [id number]  PLAN OF CARE: Discharge to home after PACU  PATIENT DISPOSITION:  PACU - hemodynamically stable.   Delay start of Pharmacological VTE agent (>24hrs) due to surgical blood loss or risk of bleeding: yes

## 2017-09-01 NOTE — Transfer of Care (Signed)
Immediate Anesthesia Transfer of Care Note  Patient: [name]  Procedure(s) Performed: TRACHEOSTOMY (N/A )  Patient Location: Nursing Unit and 5E05  Anesthesia Type:General  Level of Consciousness: awake, alert  and oriented  Airway & Oxygen Therapy: Patient placed on Ventilator (see vital sign flow sheet for setting)  Post-op Assessment: Report given to RN  Post vital signs: Reviewed and stable  Last Vitals: There were no vitals filed for this visit.  Last Pain: There were no vitals filed for this visit.       Complications: No apparent anesthesia complications

## 2017-09-02 LAB — C DIFFICILE QUICK SCREEN W PCR REFLEX
C DIFFICILE (CDIFF) TOXIN: NEGATIVE
C DIFFICLE (CDIFF) ANTIGEN: NEGATIVE
C Diff interpretation: NOT DETECTED

## 2017-09-02 LAB — POTASSIUM: POTASSIUM: 3.3 mmol/L — AB (ref 3.5–5.1)

## 2017-09-03 LAB — POTASSIUM
Potassium: 2.7 mmol/L — CL (ref 3.5–5.1)
Potassium: 3.5 mmol/L (ref 3.5–5.1)

## 2017-09-03 LAB — MAGNESIUM: Magnesium: 2 mg/dL (ref 1.7–2.4)

## 2017-09-04 ENCOUNTER — Encounter (HOSPITAL_COMMUNITY): Payer: Self-pay | Admitting: Otolaryngology

## 2017-09-04 LAB — CBC
HEMATOCRIT: 35.6 % — AB (ref 36.0–46.0)
HEMOGLOBIN: 11.3 g/dL — AB (ref 12.0–15.0)
MCH: 32 pg (ref 26.0–34.0)
MCHC: 31.7 g/dL (ref 30.0–36.0)
MCV: 100.8 fL — ABNORMAL HIGH (ref 78.0–100.0)
Platelets: 312 10*3/uL (ref 150–400)
RBC: 3.53 MIL/uL — ABNORMAL LOW (ref 3.87–5.11)
RDW: 15.9 % — ABNORMAL HIGH (ref 11.5–15.5)
WBC: 11.2 10*3/uL — ABNORMAL HIGH (ref 4.0–10.5)

## 2017-09-04 LAB — BASIC METABOLIC PANEL
Anion gap: 11 (ref 5–15)
BUN: 12 mg/dL (ref 6–20)
CHLORIDE: 104 mmol/L (ref 101–111)
CO2: 28 mmol/L (ref 22–32)
CREATININE: 0.76 mg/dL (ref 0.44–1.00)
Calcium: 8.6 mg/dL — ABNORMAL LOW (ref 8.9–10.3)
GFR calc non Af Amer: >60 mL/min (ref >60)
Glucose, Bld: 62 mg/dL — ABNORMAL LOW (ref 65–99)
Potassium: 3.2 mmol/L — ABNORMAL LOW (ref 3.5–5.1)
Sodium: 143 mmol/L (ref 135–145)

## 2017-09-04 NOTE — Op Note (Signed)
NAME:  AJUNI, PAGANI                      ACCOUNT NO.:  MEDICAL RECORD NO.:  [id number]  LOCATION:                                 FACILITY:  PHYSICIAN:  Kristine Garbe. Ezzard Standing, M.D. DATE OF BIRTH:  DATE OF PROCEDURE:  09/01/2017 DATE OF DISCHARGE:                              OPERATIVE REPORT   PREOPERATIVE DIAGNOSIS:  Acute on chronic respiratory failure.  POSTOPERATIVE DIAGNOSIS:  Acute on chronic respiratory failure.  OPERATION PERFORMED:  Tracheotomy with a #6 Shiley cuffed tube.  SURGEON:  Kristine Garbe. Ezzard Standing, MD.  ANESTHESIA:  General endotracheal.  COMPLICATIONS:  None.  BRIEF CLINICAL NOTE:  Fleta is a 67 year old female with history of congestive heart failure and AFib.  She has also had a history of COPD. She developed pneumonia several weeks ago requiring ICU care and intubation.  Attempts at extubating her were unsuccessful x3; last intubated on August 21, 2017.  She was subsequently transferred to [facility] on August 21, 2017; intubated on vent. Attempts to wean the patient have been unsuccessful and tracheotomy was recommended.  She was taken to the operating room at this time for tracheotomy.  DESCRIPTION OF PROCEDURE:  She was brought straight to the operating room from [facility].  She was intubated.  A roll was placed underneath the shoulders to extend her neck.  Neck was palpated and trach was midline without masses.  Area of incision was injected with 6-7 mL of Xylocaine with epinephrine for hemostasis and local anesthetic.  A vertical incision was made just below the cricoid cartilage.  Dissection was carried down through the subcutaneous tissue. Strap muscles were divided vertically in midline.  The cricoid cartilage and trachea were identified.  The first 2-3 tracheal rings were exposed. A horizontal incision was made between the second and third tracheal rings.  The endotracheal tube was removed and a #6 Shiley  cuffed tube was inserted without difficulty.  The patient was ventilated well. [name] was secured with 2-0 silk sutures x4 and a Velcro trach collar around the neck.  The patient was subsequently transferred back to [facility]; she had minimal bleeding.         ______________________________ Kristine Garbe. Ezzard Standing, M.D.    CEN/MEDQ  D:  09/01/2017  T:  09/01/2017  Job:  [id number]

## 2017-09-05 LAB — BASIC METABOLIC PANEL
ANION GAP: 10 (ref 5–15)
BUN: 15 mg/dL (ref 6–20)
CHLORIDE: 102 mmol/L (ref 101–111)
CO2: 31 mmol/L (ref 22–32)
Calcium: 8.7 mg/dL — ABNORMAL LOW (ref 8.9–10.3)
Creatinine, Ser: 0.79 mg/dL (ref 0.44–1.00)
GFR calc Af Amer: >60 mL/min (ref >60)
GFR calc non Af Amer: >60 mL/min (ref >60)
GLUCOSE: 79 mg/dL (ref 65–99)
POTASSIUM: 3.2 mmol/L — AB (ref 3.5–5.1)
Sodium: 143 mmol/L (ref 135–145)

## 2017-09-05 LAB — CBC
HEMATOCRIT: 36.7 % (ref 36.0–46.0)
Hemoglobin: 11.5 g/dL — ABNORMAL LOW (ref 12.0–15.0)
MCH: 31.3 pg (ref 26.0–34.0)
MCHC: 31.3 g/dL (ref 30.0–36.0)
MCV: 99.7 fL (ref 78.0–100.0)
Platelets: 324 10*3/uL (ref 150–400)
RBC: 3.68 MIL/uL — AB (ref 3.87–5.11)
RDW: 15.4 % (ref 11.5–15.5)
WBC: 10.2 10*3/uL (ref 4.0–10.5)

## 2017-09-05 LAB — PHOSPHORUS: Phosphorus: 3.1 mg/dL (ref 2.5–4.6)

## 2017-09-05 LAB — MAGNESIUM: Magnesium: 2.1 mg/dL (ref 1.7–2.4)

## 2017-09-06 LAB — BASIC METABOLIC PANEL
Anion gap: 11 (ref 5–15)
BUN: 18 mg/dL (ref 6–20)
CALCIUM: 8.6 mg/dL — AB (ref 8.9–10.3)
CO2: 32 mmol/L (ref 22–32)
Chloride: 98 mmol/L — ABNORMAL LOW (ref 101–111)
Creatinine, Ser: 0.84 mg/dL (ref 0.44–1.00)
GFR calc Af Amer: >60 mL/min (ref >60)
GLUCOSE: 105 mg/dL — AB (ref 65–99)
Potassium: 3.5 mmol/L (ref 3.5–5.1)
SODIUM: 141 mmol/L (ref 135–145)

## 2017-09-07 LAB — BASIC METABOLIC PANEL
Anion gap: 14 (ref 5–15)
BUN: 19 mg/dL (ref 6–20)
CO2: 29 mmol/L (ref 22–32)
Calcium: 8.7 mg/dL — ABNORMAL LOW (ref 8.9–10.3)
Chloride: 97 mmol/L — ABNORMAL LOW (ref 101–111)
Creatinine, Ser: 0.86 mg/dL (ref 0.44–1.00)
GFR calc Af Amer: >60 mL/min (ref >60)
Glucose, Bld: 83 mg/dL (ref 65–99)
Potassium: 2.9 mmol/L — ABNORMAL LOW (ref 3.5–5.1)
SODIUM: 140 mmol/L (ref 135–145)

## 2017-09-07 LAB — CBC
HCT: 34.5 % — ABNORMAL LOW (ref 36.0–46.0)
Hemoglobin: 10.7 g/dL — ABNORMAL LOW (ref 12.0–15.0)
MCH: 30.8 pg (ref 26.0–34.0)
MCHC: 31 g/dL (ref 30.0–36.0)
MCV: 99.4 fL (ref 78.0–100.0)
PLATELETS: 342 10*3/uL (ref 150–400)
RBC: 3.47 MIL/uL — AB (ref 3.87–5.11)
RDW: 15.4 % (ref 11.5–15.5)
WBC: 9.5 10*3/uL (ref 4.0–10.5)

## 2017-09-08 ENCOUNTER — Other Ambulatory Visit (HOSPITAL_COMMUNITY): Payer: Self-pay

## 2017-09-08 LAB — BASIC METABOLIC PANEL
ANION GAP: 12 (ref 5–15)
BUN: 14 mg/dL (ref 6–20)
CO2: 31 mmol/L (ref 22–32)
Calcium: 8.6 mg/dL — ABNORMAL LOW (ref 8.9–10.3)
Chloride: 99 mmol/L — ABNORMAL LOW (ref 101–111)
Creatinine, Ser: 0.8 mg/dL (ref 0.44–1.00)
GFR calc Af Amer: >60 mL/min (ref >60)
GFR calc non Af Amer: >60 mL/min (ref >60)
GLUCOSE: 135 mg/dL — AB (ref 65–99)
Potassium: 3 mmol/L — ABNORMAL LOW (ref 3.5–5.1)
Sodium: 142 mmol/L (ref 135–145)

## 2017-09-09 LAB — POTASSIUM: Potassium: 3.1 mmol/L — ABNORMAL LOW (ref 3.5–5.1)

## 2017-09-10 LAB — BASIC METABOLIC PANEL
Anion gap: 10 (ref 5–15)
BUN: 16 mg/dL (ref 6–20)
CHLORIDE: 96 mmol/L — AB (ref 101–111)
CO2: 31 mmol/L (ref 22–32)
CREATININE: 0.91 mg/dL (ref 0.44–1.00)
Calcium: 8.4 mg/dL — ABNORMAL LOW (ref 8.9–10.3)
GFR calc Af Amer: >60 mL/min (ref >60)
GFR calc non Af Amer: >60 mL/min (ref >60)
GLUCOSE: 130 mg/dL — AB (ref 65–99)
POTASSIUM: 3.8 mmol/L (ref 3.5–5.1)
Sodium: 137 mmol/L (ref 135–145)

## 2017-09-10 LAB — CBC
HEMATOCRIT: 30.8 % — AB (ref 36.0–46.0)
HEMOGLOBIN: 9.7 g/dL — AB (ref 12.0–15.0)
MCH: 31.2 pg (ref 26.0–34.0)
MCHC: 31.5 g/dL (ref 30.0–36.0)
MCV: 99 fL (ref 78.0–100.0)
Platelets: 279 10*3/uL (ref 150–400)
RBC: 3.11 MIL/uL — ABNORMAL LOW (ref 3.87–5.11)
RDW: 15.4 % (ref 11.5–15.5)
WBC: 7.8 10*3/uL (ref 4.0–10.5)

## 2017-09-10 LAB — MAGNESIUM: Magnesium: 1.9 mg/dL (ref 1.7–2.4)

## 2017-09-11 ENCOUNTER — Ambulatory Visit: Payer: Medicare Other | Admitting: Cardiology

## 2017-09-13 LAB — BASIC METABOLIC PANEL
ANION GAP: 11 (ref 5–15)
BUN: 24 mg/dL — ABNORMAL HIGH (ref 6–20)
CALCIUM: 8.7 mg/dL — AB (ref 8.9–10.3)
CO2: 27 mmol/L (ref 22–32)
Chloride: 99 mmol/L — ABNORMAL LOW (ref 101–111)
Creatinine, Ser: 1.19 mg/dL — ABNORMAL HIGH (ref 0.44–1.00)
GFR, EST AFRICAN AMERICAN: 53 mL/min — AB (ref >60)
GFR, EST NON AFRICAN AMERICAN: 46 mL/min — AB (ref >60)
Glucose, Bld: 345 mg/dL — ABNORMAL HIGH (ref 65–99)
Potassium: 4.3 mmol/L (ref 3.5–5.1)
SODIUM: 137 mmol/L (ref 135–145)

## 2017-09-13 LAB — CBC
HCT: 35.5 % — ABNORMAL LOW (ref 36.0–46.0)
Hemoglobin: 11 g/dL — ABNORMAL LOW (ref 12.0–15.0)
MCH: 31 pg (ref 26.0–34.0)
MCHC: 31 g/dL (ref 30.0–36.0)
MCV: 100 fL (ref 78.0–100.0)
PLATELETS: 336 10*3/uL (ref 150–400)
RBC: 3.55 MIL/uL — ABNORMAL LOW (ref 3.87–5.11)
RDW: 15.3 % (ref 11.5–15.5)
WBC: 13.2 10*3/uL — AB (ref 4.0–10.5)

## 2017-09-15 LAB — URINALYSIS, ROUTINE W REFLEX MICROSCOPIC
BILIRUBIN URINE: NEGATIVE
GLUCOSE, UA: NEGATIVE mg/dL
Hgb urine dipstick: NEGATIVE
KETONES UR: NEGATIVE mg/dL
Leukocytes, UA: NEGATIVE
Nitrite: NEGATIVE
PROTEIN: NEGATIVE mg/dL
Specific Gravity, Urine: 1.016 (ref 1.005–1.030)
pH: 6 (ref 5.0–8.0)

## 2017-09-15 LAB — CBC
HCT: 36.1 % (ref 36.0–46.0)
Hemoglobin: 11.3 g/dL — ABNORMAL LOW (ref 12.0–15.0)
MCH: 31.2 pg (ref 26.0–34.0)
MCHC: 31.3 g/dL (ref 30.0–36.0)
MCV: 99.7 fL (ref 78.0–100.0)
Platelets: 325 10*3/uL (ref 150–400)
RBC: 3.62 MIL/uL — ABNORMAL LOW (ref 3.87–5.11)
RDW: 16 % — AB (ref 11.5–15.5)
WBC: 9.4 10*3/uL (ref 4.0–10.5)

## 2017-09-16 LAB — URINE CULTURE

## 2017-09-22 ENCOUNTER — Ambulatory Visit: Payer: Medicare Other | Admitting: Gastroenterology

## 2017-09-26 DIAGNOSIS — I110 Hypertensive heart disease with heart failure: Secondary | ICD-10-CM | POA: Diagnosis not present

## 2017-09-26 DIAGNOSIS — E119 Type 2 diabetes mellitus without complications: Secondary | ICD-10-CM | POA: Diagnosis not present

## 2017-09-26 DIAGNOSIS — Z794 Long term (current) use of insulin: Secondary | ICD-10-CM | POA: Insufficient documentation

## 2017-09-26 DIAGNOSIS — M25552 Pain in left hip: Secondary | ICD-10-CM | POA: Insufficient documentation

## 2017-09-26 DIAGNOSIS — E039 Hypothyroidism, unspecified: Secondary | ICD-10-CM | POA: Diagnosis not present

## 2017-09-26 DIAGNOSIS — Z79899 Other long term (current) drug therapy: Secondary | ICD-10-CM | POA: Insufficient documentation

## 2017-09-26 DIAGNOSIS — W06XXXA Fall from bed, initial encounter: Secondary | ICD-10-CM | POA: Diagnosis not present

## 2017-09-26 DIAGNOSIS — Z7901 Long term (current) use of anticoagulants: Secondary | ICD-10-CM | POA: Insufficient documentation

## 2017-09-26 DIAGNOSIS — Z87891 Personal history of nicotine dependence: Secondary | ICD-10-CM | POA: Diagnosis not present

## 2017-09-26 DIAGNOSIS — J449 Chronic obstructive pulmonary disease, unspecified: Secondary | ICD-10-CM | POA: Insufficient documentation

## 2017-09-26 DIAGNOSIS — I5032 Chronic diastolic (congestive) heart failure: Secondary | ICD-10-CM | POA: Diagnosis not present

## 2017-09-26 DIAGNOSIS — M25551 Pain in right hip: Secondary | ICD-10-CM | POA: Diagnosis present

## 2017-09-27 ENCOUNTER — Emergency Department (HOSPITAL_COMMUNITY): Payer: Medicare Other

## 2017-09-27 ENCOUNTER — Other Ambulatory Visit: Payer: Self-pay

## 2017-09-27 ENCOUNTER — Encounter (HOSPITAL_COMMUNITY): Payer: Self-pay | Admitting: *Deleted

## 2017-09-27 ENCOUNTER — Emergency Department (HOSPITAL_COMMUNITY)
Admission: EM | Admit: 2017-09-27 | Discharge: 2017-09-27 | Disposition: A | Discharge status: Home / Self Care | Payer: Medicare Other | Attending: Emergency Medicine | Admitting: Emergency Medicine

## 2017-09-27 DIAGNOSIS — M25551 Pain in right hip: Secondary | ICD-10-CM

## 2017-09-27 DIAGNOSIS — M25552 Pain in left hip: Secondary | ICD-10-CM | POA: Diagnosis not present

## 2017-09-27 DIAGNOSIS — W19XXXA Unspecified fall, initial encounter: Secondary | ICD-10-CM

## 2017-09-27 NOTE — ED Provider Notes (Signed)
[facility] EMERGENCY DEPARTMENT Provider Note   CSN: [id number] Arrival date & time: 09/26/17  2346     History   Chief Complaint Chief Complaint  Patient presents with  . Back Pain    HPI [name] is a 67 y.o. female.  The history is provided by the patient. No language interpreter was used.  Hip Pain  This is a new problem. Episode onset: 12 days ago. The problem occurs constantly. The problem has been gradually worsening. Nothing aggravates the symptoms. Nothing relieves the symptoms. She has tried nothing for the symptoms. The treatment provided no relief.  Pt reports she fell out of bed on March 15.  Pt reports she bruised her right side.  Pt reports since fall she has been having increased pain in her hips.  Pt reports both hips are stinging and painful.  Pt reports she is able to stand and walk with her walker.   Past Medical History:  Diagnosis Date  . Anemia    H/H of 10/30 with a normal MCV in 12/09  . Anxiety   . Arthritis   . Barrett's esophagus    Diagnosed 1995. Last EGD 2016-NO BARRETT'S.   . Chest pain    Negative cardiac catheterization in 2002; negative stress nuclear study in 2008  . Chronic anticoagulation   . Chronic diastolic CHF (congestive heart failure) (HCC)    a. EF predominantly normal during prior echoes but was 40% during 10/2014 echo. b. Most recent 01/2015 EF normal, 55-60%.  . Chronic LBP    Surgical intervention in 1996  . Diabetes mellitus, type 2 (HCC)    Insulin therapy; exacerbated by prednisone  . Dysrhythmia    AFib  . Gastroparesis    99% retention 05/2008 on GES  . GERD (gastroesophageal reflux disease)   . Hiatal hernia   . Hyperlipidemia   . Hypertension   . Hypothyroid   . IBS (irritable bowel syndrome)   . Obesity   . OSA on CPAP    had CPAP and cannot tolerate.  . Paroxysmal atrial fibrillation (HCC)   . Pulmonary hypertension (HCC) 01/2015   a. Predominantly pulmonary venous hypertension but may be component of  PAH.  . Seizures (HCC)    last seizure was 2 years ago; on keppra for this; unknown etiology  . Syncope    a. Admitted 05/2009; magnetic resonance imagin/ MRA - negative; etiology thought to be orthostasis secondary to drugs and dehydration. b. Syncope 02/2015 also felt 2/2 dehydration.    Patient Active Problem List   Diagnosis Date Noted  . Palliative care by specialist   . Goals of care, counseling/discussion   . Acute on chronic diastolic CHF (congestive heart failure) (HCC) 08/17/2017  . Acute respiratory failure with hypoxia and hypercapnia (HCC) 08/16/2017  . Uncontrolled type 2 diabetes mellitus with hyperglycemia (HCC) 08/16/2017  . Acute respiratory failure with hypercapnia (HCC)   . CAP Failed Outpatient Tx 08/05/2017  . COPD exacerbation (HCC) 08/05/2017  . Pulmonary hypertension (HCC) 04/05/2017  . Derangement of posterior horn of medial meniscus of right knee   . Lateral meniscus, anterior horn derangement, right   . Syncope, near 12/26/2015  . Fall at home 12/26/2015  . Dysphagia 12/07/2015  . IDA (iron deficiency anemia) 03/13/2015  . Chronic diastolic CHF (congestive heart failure) (HCC) 02/20/2015  . Syncope and collapse 02/05/2015  . Dehydration   . Acute on chronic combined systolic and diastolic CHF (congestive heart failure) (HCC) 01/26/2015  . Low  hemoglobin   . Diffuse abdominal pain 09/16/2014  . Ascites 08/27/2014  . Anemia 07/20/2014  . Epistaxis, recurrent 07/20/2014  . Syncope 04/11/2014  . Dementia 04/11/2014  . Chronic diarrhea 04/11/2014  . DM type 2 (diabetes mellitus, type 2) (HCC) 04/11/2014  . Protein-calorie malnutrition, severe (HCC) 04/11/2014  . Acute CHF (HCC) 12/21/2013  . Atrial fibrillation with RVR (HCC) 12/21/2013  . Chest pain at rest 12/03/2013  . Obesity 12/03/2013  . Chest pain 12/03/2013  . Diarrhea 08/26/2013  . Abnormal weight loss 08/26/2013  . Lower abdominal pain 08/26/2013  . Anorexia nervosa 08/26/2013  .  Encounter for therapeutic drug monitoring 08/26/2013  . Dyspnea 06/29/2013  . Tremor 10/27/2012  . Chronic anticoagulation 06/23/2011  . HYPOTENSION, ORTHOSTATIC 08/26/2009  . Hyperlipidemia 03/04/2009  . Barrett's esophagus 09/26/2008  . Gastroparesis 09/26/2008  . Iron deficiency anemia 09/25/2008  . Essential hypertension 09/25/2008  . LUNG NODULE 09/25/2008  . Irritable bowel syndrome 09/25/2008  . Sleep apnea 09/25/2008  . CARPAL TUNNEL SYNDROME, HX OF 09/25/2008  . Hypothyroidism 04/10/2008  . DIABETES MELLITUS, TYPE II, ON INSULIN 04/10/2008  . OBESITY 04/10/2008  . Atrial fibrillation (HCC) 04/10/2008  . Congestive heart failure (HCC) 04/10/2008  . LOW BACK PAIN, CHRONIC 04/10/2008  . CHEST PAIN-PRECORDIAL 04/10/2008    Past Surgical History:  Procedure Laterality Date  . BACK SURGERY  1996  . BIOPSY N/A 11/08/2013   Procedure: BIOPSY  / Tissue sampling / ulcers present in small intestine;  Surgeon: West Bali, MD;  Location: [facility];  Service: Endoscopy;  Laterality: N/A;  . CARDIAC CATHETERIZATION  2002  . CARDIAC CATHETERIZATION N/A 01/26/2015   Procedure: Right Heart Cath;  Surgeon: Laurey Morale, MD;  Location: [facility] INVASIVE CV LAB;  Service: Cardiovascular;  Laterality: N/A;  . CARDIOVASCULAR STRESS TEST  2008   Stress nuclear study  . CARDIOVERSION N/A 03/06/2015   Procedure: CARDIOVERSION;  Surgeon: Laurey Morale, MD;  Location: [facility] ENDOSCOPY;  Service: Cardiovascular;  Laterality: N/A;  . CARPAL TUNNEL RELEASE  1994  . COLONOSCOPY  11/2011   Dr. Darrick Penna: Internal hemorrhoids, mild diverticulosis. Random colon biopsies negative.  . COLONOSCOPY N/A 11/08/2013   SLF: Normal mucosa in the terminal ileum/The colon IS redundant/  Moderate diverticulosis throughout the entire colon. ileum bx benign. colon bx benign  . ESOPHAGOGASTRODUODENOSCOPY  2008   Barrett's without dysplasia. esphagus dilated. antral erosions, h.pylori serologies negative.  .  ESOPHAGOGASTRODUODENOSCOPY  11/2011   Dr. Darrick Penna: Barrett's esophagus, mild gastritis, diverticulum in the second portion of the duodenum repeat EGD 3 years. Small bowel biopsies negative. Gastric biopsy show reactive gastropathy but no H. pylori. Esophageal biopsies consistent with GERD. Next EGD 11/2014  . ESOPHAGOGASTRODUODENOSCOPY N/A 11/21/2014   ZOX:WRUE non-erosive gastritis/irregular z-line  . GIVENS CAPSULE STUDY  12/07/2011   Proximal small bowel, rare AVM. Distal small bowel, multiple ulcers noted  . GIVENS CAPSULE STUDY N/A 09/27/2013   Distal small bowel ulcers extending to TI.  Marland Kitchen GIVENS CAPSULE STUDY N/A 10/10/2013   Procedure: GIVENS CAPSULE STUDY;  Surgeon: West Bali, MD;  Location: [facility];  Service: Endoscopy;  Laterality: N/A;  7:30  . KNEE ARTHROSCOPY WITH MEDIAL MENISECTOMY Right 06/09/2016   Procedure: KNEE ARTHROSCOPY WITH MEDIAL MENISECTOMY;  Surgeon: Vickki Hearing, MD;  Location: [facility] ORS;  Service: Orthopedics;  Laterality: Right;  medial and lateral menisectomy  . LAMINECTOMY  1995   L4-L5  . LAPAROSCOPIC CHOLECYSTECTOMY  1990s  . LEFT HEART CATHETERIZATION WITH  CORONARY ANGIOGRAM  01/10/2014   Procedure: LEFT HEART CATHETERIZATION WITH CORONARY ANGIOGRAM;  Surgeon: Lesleigh Noe, MD;  Location: [facility] CATH LAB;  Service: Cardiovascular;;  . RIGHT HEART CATHETERIZATION N/A 01/10/2014   Procedure: RIGHT HEART CATH;  Surgeon: Lesleigh Noe, MD;  Location: [facility] CATH LAB;  Service: Cardiovascular;  Laterality: N/A;  . TOTAL ABDOMINAL HYSTERECTOMY  1999  . TRACHEOSTOMY TUBE PLACEMENT N/A 09/01/2017   Procedure: TRACHEOSTOMY;  Surgeon: Drema Halon, MD;  Location: [facility] OR;  Service: ENT;  Laterality: N/A;     OB History    Gravida  2   Para  2   Term  2   Preterm      AB      Living        SAB      TAB      Ectopic      Multiple      Live Births               Home Medications    Prior to Admission medications   Medication Sig  Start Date End Date Taking? Authorizing Provider  acetaminophen (TYLENOL) 500 MG tablet Take 500 mg by mouth every 6 (six) hours as needed for moderate pain or headache.    [provider]  amiodarone (PACERONE) 200 MG tablet Take 1 tablet (200 mg total) by mouth every morning. 03/24/17   Branch, Dorothe Pea, MD  apixaban (ELIQUIS) 5 MG TABS tablet Take 1 tablet (5 mg total) by mouth 2 (two) times daily. 06/09/16   Vickki Hearing, MD  fentaNYL 2,500 mcg in sodium chloride 0.9 % 200 mL Titrate 25-400 mcg/hour for sedation on mechanical ventilation 08/22/17   Tat, Onalee Hua, MD  furosemide (LASIX) 40 MG tablet TAKE 40 MG DAILY. 03/24/17   Antoine Poche, MD  gabapentin (NEURONTIN) 400 MG capsule Take 400-800 mg by mouth 3 (three) times daily. Take 1 capsule(400mg ) by mouth twice a day and take 2 capsules(800mg ) by mouth at bedtime 07/18/17   [provider]  insulin aspart (NOVOLOG) 100 UNIT/ML injection Inject 0-20 Units into the skin every 4 (four) hours. CBG 0-120--no units; 121-150--3 units; 151-200--4 units; 201-250--7units; 251-300--11 units; 301-350--15 units; 351-400--20 units 08/22/17   Tat, David, MD  insulin glargine (LANTUS) 100 UNIT/ML injection Inject 0.55 mLs (55 Units total) into the skin at bedtime. 08/22/17   Catarina Hartshorn, MD  ipratropium-albuterol (DUONEB) 0.5-2.5 (3) MG/3ML SOLN Take 3 mLs by nebulization every 6 (six) hours. 08/22/17   Catarina Hartshorn, MD  levETIRAcetam (KEPPRA) 1000 MG tablet Take 1,000 mg by mouth 2 (two) times daily. 07/18/17   [provider]  levothyroxine (SYNTHROID, LEVOTHROID) 137 MCG tablet Take 137 mcg by mouth daily. 10/14/14   [provider]  magnesium oxide (MAG-OX) 400 MG tablet Take 400 mg by mouth daily.     [provider]  Multiple Vitamin (MULTIVITAMIN WITH MINERALS) TABS tablet Take 1 tablet by mouth daily.    [provider]  Nutritional Supplements (FEEDING SUPPLEMENT, VITAL HIGH PROTEIN,) LIQD liquid  Run continuous at 55 cc/hour 08/22/17   Tat, Onalee Hua, MD  pantoprazole (PROTONIX) 40 MG tablet TAKE ONE TABLET BY MOUTH TWICE DAILY BEFORE APPLY MEAL 02/07/17   Gelene Mink, NP  potassium chloride (K-DUR,KLOR-CON) 10 MEQ tablet TAKE TWO TABLETS BY MOUTH TWICE DAILY 04/20/17   Antoine Poche, MD    Family History Family History  Problem Relation Age of Onset  . Hypertension Mother   .  Alzheimer's disease Mother   . Stroke Mother   . Heart attack Mother   . Hypertension Other   . Breast cancer Sister   . Heart disease Neg Hx   . Colon cancer Neg Hx     Social History Social History   Tobacco Use  . Smoking status: Former Smoker    Packs/day: 0.25    Years: 15.00    Pack years: 3.75    Types: Cigarettes    Start date: 02/26/1966    Last attempt to quit: 07/01/1983    Years since quitting: 34.2  . Smokeless tobacco: Never Used  . Tobacco comment: quit in 1984  Substance Use Topics  . Alcohol use: No    Alcohol/week: 0.0 oz  . Drug use: No     Allergies   Celexa [citalopram hydrobromide]; Codeine; Dilaudid [hydromorphone hcl]; Reglan [metoclopramide]; and Ibuprofen   Review of Systems Review of Systems  All other systems reviewed and are negative.    Physical Exam Updated Vital Signs Ht 5\' 8"  (1.727 m)   Wt 100.2 kg (221 lb)   BMI 33.60 kg/m   Physical Exam  Constitutional: She appears well-developed and well-nourished. No distress.  HENT:  Head: Normocephalic and atraumatic.  Eyes: Conjunctivae are normal.  Neck: Neck supple.  Cardiovascular: Normal rate and regular rhythm.  No murmur heard. Pulmonary/Chest: Effort normal and breath sounds normal. No respiratory distress.  Abdominal: Soft. There is no tenderness.  Musculoskeletal: She exhibits tenderness. She exhibits no edema.  Bruised area 12cm right side, center clearing,   Tender bilat hips.  Right leg larger than left.  (pt reports this due to to an old injury)   Neurological: She is alert.  Skin:  Skin is warm and dry.  Psychiatric: She has a normal mood and affect.  Nursing note and vitals reviewed.    ED Treatments / Results  Labs (all labs ordered are listed, but only abnormal results are displayed) Labs Reviewed - No data to display  EKG None  Radiology No results found.  Procedures Procedures (including critical care time)  Medications Ordered in ED Medications - No data to display   Initial Impression / Assessment and Plan / ED Course  I have reviewed the triage vital signs and the nursing notes.  Pertinent labs & imaging results that were available during my care of the patient were reviewed by me and considered in my medical decision making (see chart for details).     Pt's care turned over to Dr. Nathanial Rancher pending  Final Clinical Impressions(s) / ED Diagnoses   Final diagnoses:  Pain of both hip joints    ED Discharge Orders    None       Elson Areas, [place] 09/27/17 1521    Shon Baton, MD 10/08/17 2255

## 2017-09-27 NOTE — Discharge Instructions (Addendum)
You were seen today for hip pain.  Your x-rays are negative for fracture.  Continue medications at home.

## 2017-09-27 NOTE — ED Notes (Signed)
Patient transported to X-ray 

## 2017-09-27 NOTE — ED Triage Notes (Signed)
Pt fell out of the bed on the 15th of march and pt has been having increased lower back pain; pt has bruise to right hip; pt was recently hospitalized for pneumonia and had a tracheostomy; pt given one hydrocodone and ativan 1mg  at 2145 this evening

## 2017-10-18 ENCOUNTER — Other Ambulatory Visit: Payer: Self-pay | Admitting: Gastroenterology

## 2017-10-19 NOTE — Telephone Encounter (Signed)
I think patient may be in a long-term care facility now after respiratory failure and trach? Is she still taking this?

## 2017-10-19 NOTE — Telephone Encounter (Signed)
LMOM for [name] to call. [phone]. )

## 2017-10-23 NOTE — Telephone Encounter (Signed)
Done

## 2017-10-23 NOTE — Telephone Encounter (Signed)
[name] said pt is home, came home 09/15/2017 and is doing much better. She is taking the Loperamide and needs refill.

## 2017-11-03 ENCOUNTER — Encounter

## 2017-11-03 ENCOUNTER — Ambulatory Visit (INDEPENDENT_AMBULATORY_CARE_PROVIDER_SITE_OTHER): Payer: Medicare Other | Admitting: Cardiology

## 2017-11-03 ENCOUNTER — Encounter: Payer: Self-pay | Admitting: Cardiology

## 2017-11-03 ENCOUNTER — Other Ambulatory Visit (HOSPITAL_COMMUNITY)
Admission: RE | Admit: 2017-11-03 | Discharge: 2017-11-03 | Disposition: A | Discharge status: Home / Self Care | Payer: Medicare Other | Source: Ambulatory Visit | Attending: Cardiology | Admitting: Cardiology

## 2017-11-03 VITALS — BP 119/65 | HR 70 | Ht 68.0 in | Wt 241.0 lb

## 2017-11-03 DIAGNOSIS — Z79899 Other long term (current) drug therapy: Secondary | ICD-10-CM | POA: Insufficient documentation

## 2017-11-03 DIAGNOSIS — I5033 Acute on chronic diastolic (congestive) heart failure: Secondary | ICD-10-CM

## 2017-11-03 DIAGNOSIS — I272 Pulmonary hypertension, unspecified: Secondary | ICD-10-CM | POA: Diagnosis not present

## 2017-11-03 DIAGNOSIS — I4891 Unspecified atrial fibrillation: Secondary | ICD-10-CM

## 2017-11-03 LAB — BASIC METABOLIC PANEL
Anion gap: 11 (ref 5–15)
BUN: 17 mg/dL (ref 6–20)
CALCIUM: 8.8 mg/dL — AB (ref 8.9–10.3)
CHLORIDE: 100 mmol/L — AB (ref 101–111)
CO2: 27 mmol/L (ref 22–32)
Creatinine, Ser: 0.98 mg/dL (ref 0.44–1.00)
GFR calc non Af Amer: 58 mL/min — ABNORMAL LOW (ref >60)
GLUCOSE: 166 mg/dL — AB (ref 65–99)
Potassium: 4.6 mmol/L (ref 3.5–5.1)
Sodium: 138 mmol/L (ref 135–145)

## 2017-11-03 LAB — CBC
HEMATOCRIT: 34.5 % — AB (ref 36.0–46.0)
Hemoglobin: 10.6 g/dL — ABNORMAL LOW (ref 12.0–15.0)
MCH: 30.4 pg (ref 26.0–34.0)
MCHC: 30.7 g/dL (ref 30.0–36.0)
MCV: 98.9 fL (ref 78.0–100.0)
PLATELETS: 250 10*3/uL (ref 150–400)
RBC: 3.49 MIL/uL — ABNORMAL LOW (ref 3.87–5.11)
RDW: 15.7 % — AB (ref 11.5–15.5)
WBC: 7.3 10*3/uL (ref 4.0–10.5)

## 2017-11-03 LAB — MAGNESIUM: Magnesium: 1.9 mg/dL (ref 1.7–2.4)

## 2017-11-03 MED ORDER — TORSEMIDE 20 MG PO TABS: 40.0000 mg | ORAL_TABLET | Freq: Every day | ORAL | 3 refills | Status: DC

## 2017-11-03 NOTE — Patient Instructions (Addendum)
Your physician wants you to follow-up in: 2 weeks  with Dr.Branch      STOP Lasix   START Torsemide 40 mg daily   Get lab work today: cbc,bmet, mg+, vit D   Call on Tuesday 5/7 with weights    No testing ordered today      Thank you for choosing [facility] !

## 2017-11-03 NOTE — Progress Notes (Signed)
Clinical Summary [name] is a 67 y.o.female seen today for follow up of the following medical problems.   1. Chronicsystolic/ diastolic CHF with mixed precap and post postcap Pulmonary Hypertension - echo 12/2013 PASP 85, moderate TR, RV mild to moderately dilated with normal function, could not eval diasotlic function due to afib, LVEF 50-55%. Biatrial enlargement.  - RHC 01/2015 PA 38/13 and calculated mean of 21, could not get a wedge however LVEDP 15. - repeat echo 10/2014 PASP 61 mmHg (down from 85 on prior echo), some evidence of RV dysfunction with RV TAPSE 1.4 and tissue anular systolic velocity of 7.5. Cannot evaluate diastolic function, however severe biatrial enlarement suggests significant dysfunction, - 01/2015 RHC mean PA 52, PCWP 31 - 01/2015 echo LVEF 55-60%, PASP 46  - after most recent RHC in 01/2015 she was admitted for diuresis. Discharge weight 170 lbs, reportedly down from 190 on admission. Marland Kitchen Discharged on lasix 60mg  bid.    - 8-10 lbs weight gain since early April by her home weights. In early April weights around 230, now up to high 230s low 240s.  - lasix recently in creased to 80mg  daily by pcp x 5 days, no change in weights - increaesd LE edema.    2. Afib - no palpitations. Compliant with eliquis, no bleeding issues.    3. OSA - could not tolerate CPAP   4. History of respiratory failure/prolonged intubation - admit earlier this year with pneumonia, respiratory failure requiring intubation.  - prolonged intubation, had trach.  - discharged to [facility]. Eventually off vent, trach closed.  - now home, working with PT Past Medical History:  Diagnosis Date  . Anemia    H/H of 10/30 with a normal MCV in 12/09  . Anxiety   . Arthritis   . Barrett's esophagus    Diagnosed 1995. Last EGD 2016-NO BARRETT'S.   . Chest pain    Negative cardiac catheterization in 2002; negative stress nuclear study in 2008  . Chronic anticoagulation   . Chronic  diastolic CHF (congestive heart failure) (HCC)    a. EF predominantly normal during prior echoes but was 40% during 10/2014 echo. b. Most recent 01/2015 EF normal, 55-60%.  . Chronic LBP    Surgical intervention in 1996  . Diabetes mellitus, type 2 (HCC)    Insulin therapy; exacerbated by prednisone  . Dysrhythmia    AFib  . Gastroparesis    99% retention 05/2008 on GES  . GERD (gastroesophageal reflux disease)   . Hiatal hernia   . Hyperlipidemia   . Hypertension   . Hypothyroid   . IBS (irritable bowel syndrome)   . Obesity   . OSA on CPAP    had CPAP and cannot tolerate.  . Paroxysmal atrial fibrillation (HCC)   . Pulmonary hypertension (HCC) 01/2015   a. Predominantly pulmonary venous hypertension but may be component of PAH.  . Seizures (HCC)    last seizure was 2 years ago; on keppra for this; unknown etiology  . Syncope    a. Admitted 05/2009; magnetic resonance imagin/ MRA - negative; etiology thought to be orthostasis secondary to drugs and dehydration. b. Syncope 02/2015 also felt 2/2 dehydration.     Allergies  Allergen Reactions  . Celexa [Citalopram Hydrobromide] Other (See Comments)    Dyskinesia  . Codeine Nausea And Vomiting and Other (See Comments)    HALLUCINATIONS  . Dilaudid [Hydromorphone Hcl] Other (See Comments)    Made her pass out  .  Reglan [Metoclopramide] Other (See Comments)    DYSKINESIA  . Ibuprofen Nausea And Vomiting and Other (See Comments)    Upsets her stomach and causes abdominal pain     Current Outpatient Medications  Medication Sig Dispense Refill  . acetaminophen (TYLENOL) 500 MG tablet Take 500 mg by mouth every 6 (six) hours as needed for moderate pain or headache.    Marland Kitchen amiodarone (PACERONE) 200 MG tablet Take 1 tablet (200 mg total) by mouth every morning. 90 tablet 3  . apixaban (ELIQUIS) 5 MG TABS tablet Take 1 tablet (5 mg total) by mouth 2 (two) times daily. 60 tablet 0  . fentaNYL 2,500 mcg in sodium chloride 0.9 % 200 mL  Titrate 25-400 mcg/hour for sedation on mechanical ventilation    . furosemide (LASIX) 40 MG tablet TAKE 40 MG DAILY. 90 tablet 3  . gabapentin (NEURONTIN) 400 MG capsule Take 400-800 mg by mouth 3 (three) times daily. Take 1 capsule(400mg ) by mouth twice a day and take 2 capsules(800mg ) by mouth at bedtime  11  . insulin aspart (NOVOLOG) 100 UNIT/ML injection Inject 0-20 Units into the skin every 4 (four) hours. CBG 0-120--no units; 121-150--3 units; 151-200--4 units; 201-250--7units; 251-300--11 units; 301-350--15 units; 351-400--20 units 10 mL 0  . insulin glargine (LANTUS) 100 UNIT/ML injection Inject 0.55 mLs (55 Units total) into the skin at bedtime. 10 mL 0  . ipratropium-albuterol (DUONEB) 0.5-2.5 (3) MG/3ML SOLN Take 3 mLs by nebulization every 6 (six) hours. 360 mL 0  . levETIRAcetam (KEPPRA) 1000 MG tablet Take 1,000 mg by mouth 2 (two) times daily.  1  . levothyroxine (SYNTHROID, LEVOTHROID) 137 MCG tablet Take 137 mcg by mouth daily.    Marland Kitchen loperamide (IMODIUM) 2 MG capsule TAKE ONE CAPSULE BY MOUTH TWICE DAILY AS NEEDED 60 capsule 3  . magnesium oxide (MAG-OX) 400 MG tablet Take 400 mg by mouth daily.     . Multiple Vitamin (MULTIVITAMIN WITH MINERALS) TABS tablet Take 1 tablet by mouth daily.    . Nutritional Supplements (FEEDING SUPPLEMENT, VITAL HIGH PROTEIN,) LIQD liquid Run continuous at 55 cc/hour 1000 mL 0  . pantoprazole (PROTONIX) 40 MG tablet TAKE ONE TABLET BY MOUTH TWICE DAILY BEFORE APPLY MEAL 180 tablet 3  . potassium chloride (K-DUR,KLOR-CON) 10 MEQ tablet TAKE TWO TABLETS BY MOUTH TWICE DAILY 120 tablet 11   No current facility-administered medications for this visit.      Past Surgical History:  Procedure Laterality Date  . BACK SURGERY  1996  . BIOPSY N/A 11/08/2013   Procedure: BIOPSY  / Tissue sampling / ulcers present in small intestine;  Surgeon: West Bali, MD;  Location: [facility];  Service: Endoscopy;  Laterality: N/A;  . CARDIAC CATHETERIZATION   2002  . CARDIAC CATHETERIZATION N/A 01/26/2015   Procedure: Right Heart Cath;  Surgeon: Laurey Morale, MD;  Location: [facility] INVASIVE CV LAB;  Service: Cardiovascular;  Laterality: N/A;  . CARDIOVASCULAR STRESS TEST  2008   Stress nuclear study  . CARDIOVERSION N/A 03/06/2015   Procedure: CARDIOVERSION;  Surgeon: Laurey Morale, MD;  Location: [facility] ENDOSCOPY;  Service: Cardiovascular;  Laterality: N/A;  . CARPAL TUNNEL RELEASE  1994  . COLONOSCOPY  11/2011   Dr. Darrick Penna: Internal hemorrhoids, mild diverticulosis. Random colon biopsies negative.  . COLONOSCOPY N/A 11/08/2013   SLF: Normal mucosa in the terminal ileum/The colon IS redundant/  Moderate diverticulosis throughout the entire colon. ileum bx benign. colon bx benign  . ESOPHAGOGASTRODUODENOSCOPY  2008   Barrett's  without dysplasia. esphagus dilated. antral erosions, h.pylori serologies negative.  . ESOPHAGOGASTRODUODENOSCOPY  11/2011   Dr. Darrick Penna: Barrett's esophagus, mild gastritis, diverticulum in the second portion of the duodenum repeat EGD 3 years. Small bowel biopsies negative. Gastric biopsy show reactive gastropathy but no H. pylori. Esophageal biopsies consistent with GERD. Next EGD 11/2014  . ESOPHAGOGASTRODUODENOSCOPY N/A 11/21/2014   ZOX:WRUE non-erosive gastritis/irregular z-line  . GIVENS CAPSULE STUDY  12/07/2011   Proximal small bowel, rare AVM. Distal small bowel, multiple ulcers noted  . GIVENS CAPSULE STUDY N/A 09/27/2013   Distal small bowel ulcers extending to TI.  Marland Kitchen GIVENS CAPSULE STUDY N/A 10/10/2013   Procedure: GIVENS CAPSULE STUDY;  Surgeon: West Bali, MD;  Location: [facility];  Service: Endoscopy;  Laterality: N/A;  7:30  . KNEE ARTHROSCOPY WITH MEDIAL MENISECTOMY Right 06/09/2016   Procedure: KNEE ARTHROSCOPY WITH MEDIAL MENISECTOMY;  Surgeon: Vickki Hearing, MD;  Location: [facility] ORS;  Service: Orthopedics;  Laterality: Right;  medial and lateral menisectomy  . LAMINECTOMY  1995   L4-L5  . LAPAROSCOPIC  CHOLECYSTECTOMY  1990s  . LEFT HEART CATHETERIZATION WITH CORONARY ANGIOGRAM  01/10/2014   Procedure: LEFT HEART CATHETERIZATION WITH CORONARY ANGIOGRAM;  Surgeon: Lesleigh Noe, MD;  Location: [facility] CATH LAB;  Service: Cardiovascular;;  . RIGHT HEART CATHETERIZATION N/A 01/10/2014   Procedure: RIGHT HEART CATH;  Surgeon: Lesleigh Noe, MD;  Location: [facility] CATH LAB;  Service: Cardiovascular;  Laterality: N/A;  . TOTAL ABDOMINAL HYSTERECTOMY  1999  . TRACHEOSTOMY TUBE PLACEMENT N/A 09/01/2017   Procedure: TRACHEOSTOMY;  Surgeon: Drema Halon, MD;  Location: [facility] OR;  Service: ENT;  Laterality: N/A;     Allergies  Allergen Reactions  . Celexa [Citalopram Hydrobromide] Other (See Comments)    Dyskinesia  . Codeine Nausea And Vomiting and Other (See Comments)    HALLUCINATIONS  . Dilaudid [Hydromorphone Hcl] Other (See Comments)    Made her pass out  . Reglan [Metoclopramide] Other (See Comments)    DYSKINESIA  . Ibuprofen Nausea And Vomiting and Other (See Comments)    Upsets her stomach and causes abdominal pain      Family History  Problem Relation Age of Onset  . Hypertension Mother   . Alzheimer's disease Mother   . Stroke Mother   . Heart attack Mother   . Hypertension Other   . Breast cancer Sister   . Heart disease Neg Hx   . Colon cancer Neg Hx      Social History [name] reports that she quit smoking about 34 years ago. Her smoking use included cigarettes. She started smoking about 51 years ago. She has a 3.75 pack-year smoking history. She has never used smokeless tobacco. [name] reports that she does not drink alcohol.   Review of Systems CONSTITUTIONAL: No weight loss, fever, chills, weakness or fatigue.  HEENT: Eyes: No visual loss, blurred vision, double vision or yellow sclerae.No hearing loss, sneezing, congestion, runny nose or sore throat.  SKIN: No rash or itching.  CARDIOVASCULAR: per hpi RESPIRATORY: No shortness of breath, cough or  sputum.  GASTROINTESTINAL: No anorexia, nausea, vomiting or diarrhea. No abdominal pain or blood.  GENITOURINARY: No burning on urination, no polyuria NEUROLOGICAL: No headache, dizziness, syncope, paralysis, ataxia, numbness or tingling in the extremities. No change in bowel or bladder control.  MUSCULOSKELETAL: No muscle, back pain, joint pain or stiffness.  LYMPHATICS: No enlarged nodes. No history of splenectomy.  PSYCHIATRIC: No history of depression or anxiety.  ENDOCRINOLOGIC: No reports of sweating, cold or heat intolerance. No polyuria or polydipsia.  Marland Kitchen   Physical Examination Vitals:   11/03/17 0907  BP: 119/65  Pulse: 70  SpO2: 90%   Vitals:   11/03/17 0907  Weight: 241 lb (109.3 kg)  Height: 5\' 8"  (1.727 m)    Gen: resting comfortably, no acute distress HEENT: no scleral icterus, pupils equal round and reactive, no palptable cervical adenopathy,  CV: irreg, no m/r/g, no jvd Resp: Clear to auscultation bilaterally GI: abdomen is soft, non-tender, non-distended, normal bowel sounds, no hepatosplenomegaly MSK: extremities are warm, 2+ bilateral LE edema Skin: warm, no rash Neuro:  no focal deficits Psych: appropriate affect   Diagnostic Studies  12/2013 Echo Study Conclusions  - Limited study to evaluate LV systolic function. - Left ventricle: The cavity size was normal. Wall thickness was normal. Systolic function was normal. The estimated ejection fraction was in the range of 50% to 55%. Wall motion was normal; there were no regional wall motion abnormalities. The study was not technically sufficient to allow evaluation of LV diastolic dysfunction due to atrial fibrillation. - Aortic valve: Valve area (VTI): 2.49 cm^2. Valve area (Vmax): 2.11 cm^2. - Mitral valve: There was moderate regurgitation. The MR vena contracta is 0.4 cm. - Left atrium: The atrium was severely dilated. - Right ventricle: The cavity size was mildly to  moderately dilated. - Right atrium: The atrium was severely dilated. - Atrial septum: From available images no evidence of ASD or PFO by 2D or color Doppler images. - Tricuspid valve: There was moderate regurgitation. - Pulmonary arteries: Systolic pressure was severely increased. PA peak pressure: 85 mm Hg (S).  01/2014 RHC HEMODYNAMICS: Aortic pressure 123/73 mmHg; LV pressure 122/6 mmHg; LVEDP 15 mm mercury; RA 4 mmHg; RV 31/5; PA 38/13 mmHg; PCWP(mean) unable to wedge the balloon tip catheter; Cardiac Output by Fick 4.73 L per minute  ANGIOGRAPHIC DATA: The left main coronary artery is normal.  The left anterior descending artery is widely patent, traverses the LV apex, gives origin to 2 diagonals, and has no significant obstruction..  The left circumflex artery is widely patent and gives a single large obtuse marginal Shakeeta Godette that trifurcates on the lateral wall. No significant obstruction.  The right coronary artery is dominant, gives 3 left ventricular branches and a large bifurcating PDA. No obstructions noted.Marland Kitchen  LEFT VENTRICULOGRAM: Left ventricular angiogram was done in the 30 RAO projection and revealed normal LV cavity size, tachycardia due to atrial fibrillation, and mild global reduction in LV function with an estimated ejection fraction of 40%   IMPRESSIONS: 1. Widely patent coronary arteries 2. Mild pulmonary hypertension 3. Mildly depressed LV systolic function with an estimated EF of 40% 3. This did not confirm significant pulmonary hypertension. The procedure did suggest that the right atrium and right ventricle were enlarged as we had difficulty manipulating the right heart catheter into the pulmonary artery.   RECOMMENDATION: Back to the outpatient area at home later today if no complications. She should not resume eloquence until 24 hours post procedure..  10/2014 Echo Study Conclusions  - Left ventricle: The cavity size was normal. Wall  thickness was increased in a pattern of mild LVH. There was mild concentric hypertrophy. The estimated ejection fraction was 40%. Diffuse hypokinesis. - Mitral valve: Calcified annulus. There was mild regurgitation. - Left atrium: The atrium was severely dilated. - Right atrium: The atrium was severely dilated. - Atrial septum: Thinned and stretched but no obvious PFO. No defect  or patent foramen ovale was identified. - Tricuspid valve: There was moderate regurgitation. - Impressions: E/E&' not reported and in afib diastolic evaluation not possible.  Impressions:  - E/E&' not reported and in afib diastolic evaluation not possible.   01/2015 RHC    1. Severely elevated right and left heart filling pressures.  2. Primarily pulmonary venous hypertension (may be additional component of PAH with PVR 4.6 WU).   Plan:  I am going to admit her for IV diuresis.      Right Heart Pressures RHC Procedural Findings: Hemodynamics (mmHg) RA mean 23 RV 68/14 PA 70/36, mean 52 PCWP mean 31  Oxygen saturations: PA 60% AO 98%  Cardiac Output (Fick) 4.54  Cardiac Index (Fick) 2.32  PVR 4.6 WU         Assessment and Plan  1.Acute on hronic diastolic HF with mixed pulmonary HTN -8-10 lbs weight gain since early April by home weight, increased edema - did not respond when pcp increased lasix to 80mg  daily x 5 days - we will d/c lasix, start torsemide 40mg  daily. Recheck BMET/Mg today, along with cbc and vit D level due to fatigue - she is to call Tuesday to update on weights and edema, f/u 2 weeks. Will need repeat BMET/Mg at that time.    2. Afib - no symptoms, continue currnet meds         Antoine Poche, M.D.

## 2017-11-04 LAB — VITAMIN D 25 HYDROXY (VIT D DEFICIENCY, FRACTURES): VIT D 25 HYDROXY: 26.7 ng/mL — AB (ref 30.0–100.0)

## 2017-11-10 ENCOUNTER — Telehealth: Payer: Self-pay

## 2017-11-10 NOTE — Telephone Encounter (Signed)
-----   Message from Antoine Poche, MD sent at 11/10/2017  3:09 PM EDT ----- Weights coming down, how is her swelling and symptoms doing?Marland Kitchen When she hits 230 I would have her change to toresmide 20mg  daily, but take 40mg  if she gets to 232 or higher  Dominga Ferry MD ----- Message ----- From: Fonnie Birkenhead, CMA Sent: 11/09/2017   3:50 PM To: Antoine Poche, MD  [name] called to advise on pt's weights- 239 from may 1-5, started new medicine on 5th - on 5/6 her weight went to 236, 5/7 was  234, 5/8 was 232 and 5/9 was 232.

## 2017-11-10 NOTE — Telephone Encounter (Signed)
Called pt. Left detailed message on private voicemail.

## 2017-11-13 ENCOUNTER — Ambulatory Visit: Payer: Medicare Other | Admitting: Gastroenterology

## 2017-11-14 ENCOUNTER — Other Ambulatory Visit: Payer: Self-pay | Admitting: Nurse Practitioner

## 2017-11-23 ENCOUNTER — Encounter: Payer: Self-pay | Admitting: Physician Assistant

## 2017-11-23 NOTE — Progress Notes (Signed)
Cardiology Office Note    Date:  11/24/2017  ID:  Betania, Dizon 12-May-1951, MRN [id number] PCP:  Gareth Morgan, MD  Cardiologist:  Dina Rich, MD  Chief Complaint: f/u CHF  History of Present Illness:  RUTH KOVICH is a 67 y.o. female with history of chronic combined CHF with mixed precap and post postcap pulmonary hypertension, HTN, HLD, paroxysmal atrial fibrillation, OSA on CPAP, DM, hypothyroidism, IBS, chronic anemia who presents for f/u of CHF.  She predominantly has had history of diastolic and right heart failure/pulm although EF 10/2014 was 40%. She was referred to CHF team in 01/2015 for RHC due to discrepancy in PASP estimated by echo. This showed severely elevated R & L heart filling pressures as well as primarily pulmonary venous hypertension (may be additional component of PAH with PVR 4.6 WU). She was admitted for diuresis at that time. It was also noted that she had been in persistent atrial fib for months and amiodarone/apixaban were added with plans for outpatient DCCV after about a month of therapy given recent worsening of CHF. She was readmitted 8/3-02/06/15 with an episode of syncope. She had reported feeling weak for several days along with falls and significant thirst. She was hypotensive and dehydrated (felt to be cause of syncope) so Lasix dose was decreased. Coreg and lisinopril were discontinued. Repeat echo 01/27/15 showed mod LVH, EF 55-60%, no RWMA, mild MR, severely dilated LA/RA, mod TR, mod increase PASP. She eventually underwent cardioversion to NSR 03/2015 at which time amiodarone was reduced. Her office visit 08/2015 documented irregular rhythm by exam but no changes were made to regimen at that time. She was seen by pulm in 2017 for abnormal PFTs and it was felt there was no convincing evidence of amiodarone toxicity. F/u q77mo was recommended but this never occurred. During admissions in 12/2015 for near-syncope and PNA in 08/2017 she was reported to be in  NSR. The latter admission was notable for prolonged respiratory failure requiring intubation and trach with [facility] stay. Her trach was eventually closed. 2D echo 08/2017 showed EF 55-60%, grade 2 DD, mod-severe LAE, grossly enlarged RV with normal function, moderately increased PASP .   She has had previous office visits requiring med adjustment for HF. She was seen in clinic 11/03/17 by Dr. Wyline Mood with increased edema and weight gain. In 2016 she weighed 170lb at dc but has had steady gain since that time. Her weight at discharge 08/2017 was 230lb. At OV 11/03/17, she was up to 241lb. HR was documented as irregular in that note's exam. The patient had not responded to increase in Lasix by PCP to 80mg  daily, so torsemide was started at 40mg  daily.   She returns for follow-up today with her daughter. She states she was started on PRN O2 by PCP sometime in April. O2 sat was 89% after walking to the table then 92% at rest. She is not really on any other pulm regimen. In general she does not feel much different than 11/03/17 - her weight initially improved with torsemide down to 232 but she's had steady gain back up to around 238 by home scale. Her edema is about the same per her report. She also reports a reddening under her pannus without odor or broken skin.  Most recent labs 5/3 showed K 4.6, Cr 0.98, Mg 1.9, Hgb 10.6, Plt 250. Last TSH 08/2017 wnl, LFTs 08/2017 were elvated AST 90/ALT 64/albumin 2.4.    Past Medical History:  Diagnosis Date  .  Abnormal pulmonary function test   . Anemia    H/H of 10/30 with a normal MCV in 12/09  . Anxiety   . Arthritis   . Barrett's esophagus    Diagnosed 1995. Last EGD 2016-NO BARRETT'S.   . Chest pain    Negative cardiac catheterization in 2002; negative stress nuclear study in 2008  . Chronic anticoagulation   . Chronic combined systolic and diastolic CHF (congestive heart failure) (HCC)    a. EF predominantly normal during prior echoes but was 40% during  10/2014 echo. b. Most recent 01/2015 EF normal, 55-60%.  . Chronic LBP    Surgical intervention in 1996  . Diabetes mellitus, type 2 (HCC)    Insulin therapy; exacerbated by prednisone  . Dysrhythmia    AFib  . Gastroparesis    99% retention 05/2008 on GES  . GERD (gastroesophageal reflux disease)   . Hiatal hernia   . Hyperlipidemia   . Hypertension   . Hypothyroid   . IBS (irritable bowel syndrome)   . Obesity   . OSA on CPAP    had CPAP and cannot tolerate.  . Paroxysmal atrial fibrillation (HCC)   . Pulmonary hypertension (HCC) 01/2015   a. Predominantly pulmonary venous hypertension but may be component of PAH.  . Seizures (HCC)    last seizure was 2 years ago; on keppra for this; unknown etiology  . Syncope    a. Admitted 05/2009; magnetic resonance imagin/ MRA - negative; etiology thought to be orthostasis secondary to drugs and dehydration. b. Syncope 02/2015 also felt 2/2 dehydration.    Past Surgical History:  Procedure Laterality Date  . BACK SURGERY  1996  . BIOPSY N/A 11/08/2013   Procedure: BIOPSY  / Tissue sampling / ulcers present in small intestine;  Surgeon: West Bali, MD;  Location: [facility];  Service: Endoscopy;  Laterality: N/A;  . CARDIAC CATHETERIZATION  2002  . CARDIAC CATHETERIZATION N/A 01/26/2015   Procedure: Right Heart Cath;  Surgeon: Laurey Morale, MD;  Location: [facility] INVASIVE CV LAB;  Service: Cardiovascular;  Laterality: N/A;  . CARDIOVASCULAR STRESS TEST  2008   Stress nuclear study  . CARDIOVERSION N/A 03/06/2015   Procedure: CARDIOVERSION;  Surgeon: Laurey Morale, MD;  Location: [facility] ENDOSCOPY;  Service: Cardiovascular;  Laterality: N/A;  . CARPAL TUNNEL RELEASE  1994  . COLONOSCOPY  11/2011   Dr. Darrick Penna: Internal hemorrhoids, mild diverticulosis. Random colon biopsies negative.  . COLONOSCOPY N/A 11/08/2013   SLF: Normal mucosa in the terminal ileum/The colon IS redundant/  Moderate diverticulosis throughout the entire colon. ileum bx  benign. colon bx benign  . ESOPHAGOGASTRODUODENOSCOPY  2008   Barrett's without dysplasia. esphagus dilated. antral erosions, h.pylori serologies negative.  . ESOPHAGOGASTRODUODENOSCOPY  11/2011   Dr. Darrick Penna: Barrett's esophagus, mild gastritis, diverticulum in the second portion of the duodenum repeat EGD 3 years. Small bowel biopsies negative. Gastric biopsy show reactive gastropathy but no H. pylori. Esophageal biopsies consistent with GERD. Next EGD 11/2014  . ESOPHAGOGASTRODUODENOSCOPY N/A 11/21/2014   SWN:IOEV non-erosive gastritis/irregular z-line  . GIVENS CAPSULE STUDY  12/07/2011   Proximal small bowel, rare AVM. Distal small bowel, multiple ulcers noted  . GIVENS CAPSULE STUDY N/A 09/27/2013   Distal small bowel ulcers extending to TI.  Marland Kitchen GIVENS CAPSULE STUDY N/A 10/10/2013   Procedure: GIVENS CAPSULE STUDY;  Surgeon: West Bali, MD;  Location: [facility];  Service: Endoscopy;  Laterality: N/A;  7:30  . KNEE ARTHROSCOPY WITH MEDIAL MENISECTOMY Right  06/09/2016   Procedure: KNEE ARTHROSCOPY WITH MEDIAL MENISECTOMY;  Surgeon: Vickki Hearing, MD;  Location: [facility] ORS;  Service: Orthopedics;  Laterality: Right;  medial and lateral menisectomy  . LAMINECTOMY  1995   L4-L5  . LAPAROSCOPIC CHOLECYSTECTOMY  1990s  . LEFT HEART CATHETERIZATION WITH CORONARY ANGIOGRAM  01/10/2014   Procedure: LEFT HEART CATHETERIZATION WITH CORONARY ANGIOGRAM;  Surgeon: Lesleigh Noe, MD;  Location: [facility] CATH LAB;  Service: Cardiovascular;;  . RIGHT HEART CATHETERIZATION N/A 01/10/2014   Procedure: RIGHT HEART CATH;  Surgeon: Lesleigh Noe, MD;  Location: [facility] CATH LAB;  Service: Cardiovascular;  Laterality: N/A;  . TOTAL ABDOMINAL HYSTERECTOMY  1999  . TRACHEOSTOMY TUBE PLACEMENT N/A 09/01/2017   Procedure: TRACHEOSTOMY;  Surgeon: Drema Halon, MD;  Location: [facility] OR;  Service: ENT;  Laterality: N/A;    Current Medications: Current Meds  Medication Sig  . acetaminophen (TYLENOL) 500 MG tablet  Take 500 mg by mouth every 6 (six) hours as needed for moderate pain or headache.  Marland Kitchen amiodarone (PACERONE) 200 MG tablet Take 1 tablet (200 mg total) by mouth every morning.  Marland Kitchen apixaban (ELIQUIS) 5 MG TABS tablet Take 1 tablet (5 mg total) by mouth 2 (two) times daily.  . Cholecalciferol (VITAMIN D3) 2000 units capsule Take 2,000 Units by mouth daily.  Marland Kitchen gabapentin (NEURONTIN) 400 MG capsule Take 400-800 mg by mouth 3 (three) times daily. Take 1 capsule(400mg ) by mouth twice a day and take 2 capsules(800mg ) by mouth at bedtime  . insulin aspart (NOVOLOG) 100 UNIT/ML injection Inject 0-20 Units into the skin every 4 (four) hours. CBG 0-120--no units; 121-150--3 units; 151-200--4 units; 201-250--7units; 251-300--11 units; 301-350--15 units; 351-400--20 units  . insulin glargine (LANTUS) 100 UNIT/ML injection Inject 0.55 mLs (55 Units total) into the skin at bedtime.  Marland Kitchen ipratropium-albuterol (DUONEB) 0.5-2.5 (3) MG/3ML SOLN Take 3 mLs by nebulization every 6 (six) hours.  . levETIRAcetam (KEPPRA) 1000 MG tablet Take 1,000 mg by mouth 2 (two) times daily.  Marland Kitchen levothyroxine (SYNTHROID, LEVOTHROID) 137 MCG tablet Take 137 mcg by mouth daily.  Marland Kitchen loperamide (IMODIUM) 2 MG capsule TAKE ONE CAPSULE BY MOUTH TWICE DAILY AS NEEDED  . magnesium oxide (MAG-OX) 400 MG tablet Take 400 mg by mouth daily.   . Multiple Vitamin (MULTIVITAMIN WITH MINERALS) TABS tablet Take 1 tablet by mouth daily.  . Nutritional Supplements (FEEDING SUPPLEMENT, VITAL HIGH PROTEIN,) LIQD liquid Run continuous at 55 cc/hour  . ondansetron (ZOFRAN) 4 MG tablet TAKE ONE TABLET BY MOUTH FOUR TIMES DAILY AS NEEDED FOR NAUSEA  . pantoprazole (PROTONIX) 40 MG tablet TAKE ONE TABLET BY MOUTH TWICE DAILY BEFORE APPLY MEAL  . potassium chloride (K-DUR,KLOR-CON) 10 MEQ tablet TAKE TWO TABLETS BY MOUTH TWICE DAILY  . torsemide (DEMADEX) 20 MG tablet Take 2 tablets (40 mg total) by mouth daily.    Allergies:   Celexa [citalopram hydrobromide];  Codeine; Dilaudid [hydromorphone hcl]; Reglan [metoclopramide]; and Ibuprofen   Social History   Socioeconomic History  . Marital status: Married    Spouse name: Not on file  . Number of children: Not on file  . Years of education: Not on file  . Highest education level: Not on file  Occupational History  . Occupation: Passenger transport manager at [employer]    Employer: [employer]    Comment: disabled    Employer: RETIRED  Social Needs  . Financial resource strain: Not on file  . Food insecurity:    Worry: Not on file  Inability: Not on file  . Transportation needs:    Medical: Not on file    Non-medical: Not on file  Tobacco Use  . Smoking status: Former Smoker    Packs/day: 0.25    Years: 15.00    Pack years: 3.75    Types: Cigarettes    Start date: 02/26/1966    Last attempt to quit: 07/01/1983    Years since quitting: 34.4  . Smokeless tobacco: Never Used  . Tobacco comment: quit in 1984  Substance and Sexual Activity  . Alcohol use: No    Alcohol/week: 0.0 oz  . Drug use: No  . Sexual activity: Yes    Birth control/protection: None, Surgical  Lifestyle  . Physical activity:    Days per week: Not on file    Minutes per session: Not on file  . Stress: Not on file  Relationships  . Social connections:    Talks on phone: Not on file    Gets together: Not on file    Attends religious service: Not on file    Active member of club or organization: Not on file    Attends meetings of clubs or organizations: Not on file    Relationship status: Not on file  Other Topics Concern  . Not on file  Social History Narrative   Sedentary   4 children, "blended family"     Family History:  The patient's family history includes Alzheimer's disease in her mother; Breast cancer in her sister; Heart attack in her mother; Hypertension in her mother and other; Stroke in her mother. There is no history of Heart disease or Colon cancer.  ROS:   Please see the history of present illness.  All  other systems are reviewed and otherwise negative.    PHYSICAL EXAM:   VS:  BP 130/70   Pulse 62   Ht 5\' 6"  (1.676 m)   Wt 240 lb (108.9 kg)   SpO2 92%   BMI 38.74 kg/m   BMI: Body mass index is 38.74 kg/m. GEN: Chronically ill but comfortable appearing WF, in no acute distress HEENT: normocephalic, atraumatic Neck: no JVD, carotid bruits, or masses Cardiac: RRR; no murmurs, rubs, or gallops, no edema  Respiratory: generally clear without wheezes, rales or rhonchi, normal work of breathing GI: soft, nontender, nondistended, + BS MS: no deformity or atrophy Skin: warm and dry. She reveals some erythema in the creases underneath her pannus above right and left groins, possibly early candidiasis. No skin breakdown, odor or suppuration Neuro:  Alert and Oriented x 3, Strength and sensation are intact, follows commands Psych: euthymic mood, full affect  Wt Readings from Last 3 Encounters:  11/24/17 240 lb (108.9 kg)  11/03/17 241 lb (109.3 kg)  09/27/17 221 lb (100.2 kg)      Studies/Labs Reviewed:   EKG:  EKG was ordered today and personally reviewed by me and demonstrates NSR 62bpm, poor R wave progression with nonspecific STT changes   Recent Labs: 08/21/2017: B Natriuretic Peptide 333.0 08/23/2017: ALT 65; TSH 2.066 11/03/2017: BUN 17; Creatinine, Ser 0.98; Hemoglobin 10.6; Magnesium 1.9; Platelets 250; Potassium 4.6; Sodium 138   Lipid Panel    Component Value Date/Time   CHOL 77 09/02/2011 0446   TRIG 81 08/21/2017 0512   HDL 35 (L) 09/02/2011 0446   CHOLHDL 2.2 09/02/2011 0446   VLDL 18 09/02/2011 0446   LDLCALC 24 09/02/2011 0446    Additional studies/ records that were reviewed today include: Summarized above.  ASSESSMENT & PLAN:   1. Acute on chronic combined CHF/right heart failure/pulmonary HTN - Ms. Leise initially saw improvement with torsemide then weight began to go back up. I am concerned about her heart failure being a reflection of worsening  pulmonary status. Her pulse ox was 92% after resting today in clinic which she states PCP put her on oxygen for. She had abnormal PFTs in 2017 and is on amiodarone. She was hesitant to redo PFTs because she felt like the proctor was harsh with instructions while undergoing the test. She is willing to revisit. We've scheduled her to see pulmonary early next week for an office visit to further assess. I would like their input on her amiodarone as well as overall pulmonary regimen. She does not appear acutely decompensated in clinic but I am worried about her recurrent weight gain. Will check labs today to determine where to go with her diuretic therapy. We have to be somewhat cautious given her history of drying out. She also is drinking more fluids than she probably should. Reviewed 2g sodium restriction, 2L fluid restriction, daily weights with patient. 2. Paroxysmal atrial fibrillation - as above. Continue current regimen but await pulm input regarding amiodarone. Check LFTs, thyroid. She is fortunately in NSR today. 3. Abnormal pulmonary function testing - as above, appreciate pulm input. 4. Irritant dermatitis underneath pannus - suspect due to location with creases. Discussed keeping clean and as dry as possible. Will try a trial of nystatin powder as this may represent early candidiasis. I encouraged her to reach out to her PCP to determine if they have other suggestions for wicking away moisture to this difficult area.  Disposition: F/u with our office in 2 weeks for recheck edema.   Medication Adjustments/Labs and Tests Ordered: Current medicines are reviewed at length with the patient today.  Concerns regarding medicines are outlined above. Medication changes, Labs and Tests ordered today are summarized above and listed in the Patient Instructions accessible in Encounters.   Signed, Laurann Montana, PA-C  11/24/2017 3:22 PM    [facility] [place] Location in [facility] [address] [place], [place] [address] Ph: [phone]; Fax [phone]

## 2017-11-24 ENCOUNTER — Other Ambulatory Visit (HOSPITAL_COMMUNITY)
Admission: RE | Admit: 2017-11-24 | Discharge: 2017-11-24 | Disposition: A | Discharge status: Home / Self Care | Payer: Medicare Other | Source: Ambulatory Visit | Attending: Physician Assistant | Admitting: Physician Assistant

## 2017-11-24 ENCOUNTER — Ambulatory Visit (INDEPENDENT_AMBULATORY_CARE_PROVIDER_SITE_OTHER): Payer: Medicare Other | Admitting: Physician Assistant

## 2017-11-24 ENCOUNTER — Encounter: Payer: Self-pay | Admitting: Physician Assistant

## 2017-11-24 VITALS — BP 130/70 | HR 62 | Ht 66.0 in | Wt 240.0 lb

## 2017-11-24 DIAGNOSIS — I5043 Acute on chronic combined systolic (congestive) and diastolic (congestive) heart failure: Secondary | ICD-10-CM

## 2017-11-24 DIAGNOSIS — I480 Paroxysmal atrial fibrillation: Secondary | ICD-10-CM

## 2017-11-24 DIAGNOSIS — R942 Abnormal results of pulmonary function studies: Secondary | ICD-10-CM | POA: Diagnosis not present

## 2017-11-24 DIAGNOSIS — I272 Pulmonary hypertension, unspecified: Secondary | ICD-10-CM

## 2017-11-24 DIAGNOSIS — R21 Rash and other nonspecific skin eruption: Secondary | ICD-10-CM | POA: Diagnosis not present

## 2017-11-24 LAB — HEPATIC FUNCTION PANEL
ALBUMIN: 3.4 g/dL — AB (ref 3.5–5.0)
ALT: 16 U/L (ref 14–54)
AST: 19 U/L (ref 15–41)
Alkaline Phosphatase: 64 U/L (ref 38–126)
BILIRUBIN TOTAL: 0.7 mg/dL (ref 0.3–1.2)
Bilirubin, Direct: <0.1 mg/dL — ABNORMAL LOW (ref 0.1–0.5)
TOTAL PROTEIN: 6.6 g/dL (ref 6.5–8.1)

## 2017-11-24 LAB — CBC
HCT: 35 % — ABNORMAL LOW (ref 36.0–46.0)
Hemoglobin: 11 g/dL — ABNORMAL LOW (ref 12.0–15.0)
MCH: 30.1 pg (ref 26.0–34.0)
MCHC: 31.4 g/dL (ref 30.0–36.0)
MCV: 95.9 fL (ref 78.0–100.0)
PLATELETS: 213 10*3/uL (ref 150–400)
RBC: 3.65 MIL/uL — AB (ref 3.87–5.11)
RDW: 14.9 % (ref 11.5–15.5)
WBC: 7 10*3/uL (ref 4.0–10.5)

## 2017-11-24 LAB — BASIC METABOLIC PANEL
Anion gap: 9 (ref 5–15)
BUN: 24 mg/dL — ABNORMAL HIGH (ref 6–20)
CO2: 31 mmol/L (ref 22–32)
Calcium: 8.8 mg/dL — ABNORMAL LOW (ref 8.9–10.3)
Chloride: 97 mmol/L — ABNORMAL LOW (ref 101–111)
Creatinine, Ser: 1.3 mg/dL — ABNORMAL HIGH (ref 0.44–1.00)
GFR calc non Af Amer: 41 mL/min — ABNORMAL LOW (ref >60)
GFR, EST AFRICAN AMERICAN: 48 mL/min — AB (ref >60)
Glucose, Bld: 175 mg/dL — ABNORMAL HIGH (ref 65–99)
POTASSIUM: 4.5 mmol/L (ref 3.5–5.1)
SODIUM: 137 mmol/L (ref 135–145)

## 2017-11-24 LAB — MAGNESIUM: Magnesium: 1.6 mg/dL — ABNORMAL LOW (ref 1.7–2.4)

## 2017-11-24 LAB — TSH: TSH: 5.954 u[IU]/mL — ABNORMAL HIGH (ref 0.350–4.500)

## 2017-11-24 LAB — T4, FREE: FREE T4: 1.25 ng/dL (ref 0.82–1.77)

## 2017-11-24 MED ORDER — NYSTATIN 100000 UNIT/GM EX POWD: Freq: Two times a day (BID) | CUTANEOUS | 0 refills | Status: DC

## 2017-11-24 NOTE — Patient Instructions (Addendum)
   For patients with congestive heart failure, we give them these special instructions:  1. Follow a low-salt diet - you are allowed no more than 2,000mg  of sodium per day. Watch your fluid intake. In general, you should not be taking in more than 2 liters of fluid per day (no more than 8 glasses per day). This includes sources of water in foods like soup, coffee, tea, milk, etc. 2. Weigh yourself on the same scale at same time of day and keep a log. 3. Call your doctor: (Anytime you feel any of the following symptoms)  - 3lb weight gain overnight or 5lb within a few days - Shortness of breath, with or without a dry hacking cough  - Swelling in the hands, feet or stomach  - If you have to sleep on extra pillows at night in order to breathe   IT IS IMPORTANT TO LET YOUR DOCTOR KNOW EARLY ON IF YOU ARE HAVING SYMPTOMS SO WE CAN HELP YOU!  Your physician recommends that you schedule a follow-up appointment in: 2 WEEKS   Your physician has recommended you make the following change in your medication:   NYSTATIN POWER UNDER ABDOMEN TWICE DAILY  Your physician recommends that you return for lab work in: TODAY CBC/BMP/LFT/MG/TSH/FREE T4   Your physician has recommended that you have a pulmonary function test. Pulmonary Function Tests are a group of tests that measure how well air moves in and out of your lungs.  WE HAVE SCHEDULED YOU WITH PULMONOLOGIST   Thank you for choosing  [facility]!!

## 2017-11-25 ENCOUNTER — Telehealth: Payer: Self-pay | Admitting: Physician Assistant

## 2017-11-25 ENCOUNTER — Emergency Department (HOSPITAL_COMMUNITY): Payer: Medicare Other

## 2017-11-25 ENCOUNTER — Inpatient Hospital Stay (HOSPITAL_COMMUNITY)
Admission: EM | Admit: 2017-11-25 | Discharge: 2017-11-29 | DRG: 293 | Disposition: A | Discharge status: Home / Self Care | Payer: Medicare Other | Attending: Dermatology | Admitting: Dermatology

## 2017-11-25 ENCOUNTER — Encounter (HOSPITAL_COMMUNITY): Payer: Self-pay | Admitting: Emergency Medicine

## 2017-11-25 ENCOUNTER — Telehealth: Payer: Self-pay | Admitting: Cardiology

## 2017-11-25 DIAGNOSIS — Z794 Long term (current) use of insulin: Secondary | ICD-10-CM

## 2017-11-25 DIAGNOSIS — Z79890 Hormone replacement therapy (postmenopausal): Secondary | ICD-10-CM

## 2017-11-25 DIAGNOSIS — I110 Hypertensive heart disease with heart failure: Principal | ICD-10-CM | POA: Diagnosis present

## 2017-11-25 DIAGNOSIS — N179 Acute kidney failure, unspecified: Secondary | ICD-10-CM

## 2017-11-25 DIAGNOSIS — E785 Hyperlipidemia, unspecified: Secondary | ICD-10-CM | POA: Diagnosis present

## 2017-11-25 DIAGNOSIS — Z87891 Personal history of nicotine dependence: Secondary | ICD-10-CM

## 2017-11-25 DIAGNOSIS — Z6838 Body mass index (BMI) 38.0-38.9, adult: Secondary | ICD-10-CM

## 2017-11-25 DIAGNOSIS — G4733 Obstructive sleep apnea (adult) (pediatric): Secondary | ICD-10-CM | POA: Diagnosis present

## 2017-11-25 DIAGNOSIS — K3184 Gastroparesis: Secondary | ICD-10-CM | POA: Diagnosis present

## 2017-11-25 DIAGNOSIS — R0602 Shortness of breath: Secondary | ICD-10-CM | POA: Diagnosis not present

## 2017-11-25 DIAGNOSIS — I5033 Acute on chronic diastolic (congestive) heart failure: Secondary | ICD-10-CM | POA: Diagnosis present

## 2017-11-25 DIAGNOSIS — K589 Irritable bowel syndrome without diarrhea: Secondary | ICD-10-CM | POA: Diagnosis present

## 2017-11-25 DIAGNOSIS — Z7901 Long term (current) use of anticoagulants: Secondary | ICD-10-CM

## 2017-11-25 DIAGNOSIS — E669 Obesity, unspecified: Secondary | ICD-10-CM | POA: Diagnosis present

## 2017-11-25 DIAGNOSIS — I5021 Acute systolic (congestive) heart failure: Secondary | ICD-10-CM | POA: Diagnosis present

## 2017-11-25 DIAGNOSIS — Z7951 Long term (current) use of inhaled steroids: Secondary | ICD-10-CM

## 2017-11-25 DIAGNOSIS — Z79899 Other long term (current) drug therapy: Secondary | ICD-10-CM

## 2017-11-25 DIAGNOSIS — I480 Paroxysmal atrial fibrillation: Secondary | ICD-10-CM | POA: Diagnosis present

## 2017-11-25 DIAGNOSIS — E1143 Type 2 diabetes mellitus with diabetic autonomic (poly)neuropathy: Secondary | ICD-10-CM | POA: Diagnosis present

## 2017-11-25 DIAGNOSIS — K219 Gastro-esophageal reflux disease without esophagitis: Secondary | ICD-10-CM | POA: Diagnosis present

## 2017-11-25 DIAGNOSIS — Z8249 Family history of ischemic heart disease and other diseases of the circulatory system: Secondary | ICD-10-CM

## 2017-11-25 DIAGNOSIS — Z823 Family history of stroke: Secondary | ICD-10-CM

## 2017-11-25 DIAGNOSIS — E039 Hypothyroidism, unspecified: Secondary | ICD-10-CM | POA: Diagnosis present

## 2017-11-25 DIAGNOSIS — I5043 Acute on chronic combined systolic (congestive) and diastolic (congestive) heart failure: Secondary | ICD-10-CM

## 2017-11-25 DIAGNOSIS — I2729 Other secondary pulmonary hypertension: Secondary | ICD-10-CM | POA: Diagnosis present

## 2017-11-25 LAB — BASIC METABOLIC PANEL
ANION GAP: 11 (ref 5–15)
BUN: 24 mg/dL — AB (ref 6–20)
CHLORIDE: 98 mmol/L — AB (ref 101–111)
CO2: 27 mmol/L (ref 22–32)
Calcium: 9 mg/dL (ref 8.9–10.3)
Creatinine, Ser: 1.44 mg/dL — ABNORMAL HIGH (ref 0.44–1.00)
GFR, EST AFRICAN AMERICAN: 42 mL/min — AB (ref >60)
GFR, EST NON AFRICAN AMERICAN: 36 mL/min — AB (ref >60)
Glucose, Bld: 168 mg/dL — ABNORMAL HIGH (ref 65–99)
POTASSIUM: 4.5 mmol/L (ref 3.5–5.1)
SODIUM: 136 mmol/L (ref 135–145)

## 2017-11-25 LAB — CBC
HEMATOCRIT: 36.2 % (ref 36.0–46.0)
Hemoglobin: 11.4 g/dL — ABNORMAL LOW (ref 12.0–15.0)
MCH: 29.8 pg (ref 26.0–34.0)
MCHC: 31.5 g/dL (ref 30.0–36.0)
MCV: 94.8 fL (ref 78.0–100.0)
PLATELETS: 208 10*3/uL (ref 150–400)
RBC: 3.82 MIL/uL — ABNORMAL LOW (ref 3.87–5.11)
RDW: 14.9 % (ref 11.5–15.5)
WBC: 8.8 10*3/uL (ref 4.0–10.5)

## 2017-11-25 LAB — I-STAT TROPONIN, ED: TROPONIN I, POC: 0.05 ng/mL (ref 0.00–0.08)

## 2017-11-25 NOTE — Telephone Encounter (Signed)
Reviewed labs with Dr.Bensimhon - in the face of borderline O2 sats and lack of clinical improvement at OV yesterday, with rising BUN/Cr, concern is for cardiorenal status. Consideration could be given to OP med titration but I truly worry about her OP f/u plan given the long holiday weekend. Dr.  Gala Romney does feel she would likely benefit from IV diuresis. Called and [facility] for patient to proceed to [facility] ER, and to call us back when she got the message. Unfortunately daughter has same # listed. Tried to call husband but no answer. I also signed out to Nada Boozer to try again later as well. Olan Kurek PA-C

## 2017-11-25 NOTE — Telephone Encounter (Signed)
I left messages again on pt;s home phone number and with her husband's mobile phone.   I have asked her to come to ER for IV diuresis.

## 2017-11-25 NOTE — ED Triage Notes (Addendum)
Reports being seen at cardiology on Friday and was called today and told to come here for elevated kidney function and increased edema.  During triage O2 sat noted to be 75%.  Placed on 2L [place].  Up to 95%.  Reports she wears oxygen at home as needed.

## 2017-11-26 ENCOUNTER — Other Ambulatory Visit: Payer: Self-pay

## 2017-11-26 ENCOUNTER — Emergency Department (HOSPITAL_COMMUNITY): Payer: Medicare Other

## 2017-11-26 ENCOUNTER — Encounter (HOSPITAL_COMMUNITY): Payer: Self-pay

## 2017-11-26 DIAGNOSIS — Z794 Long term (current) use of insulin: Secondary | ICD-10-CM | POA: Diagnosis not present

## 2017-11-26 DIAGNOSIS — I5031 Acute diastolic (congestive) heart failure: Secondary | ICD-10-CM | POA: Diagnosis not present

## 2017-11-26 DIAGNOSIS — N289 Disorder of kidney and ureter, unspecified: Secondary | ICD-10-CM

## 2017-11-26 DIAGNOSIS — Z6838 Body mass index (BMI) 38.0-38.9, adult: Secondary | ICD-10-CM | POA: Diagnosis not present

## 2017-11-26 DIAGNOSIS — I5030 Unspecified diastolic (congestive) heart failure: Secondary | ICD-10-CM | POA: Diagnosis not present

## 2017-11-26 DIAGNOSIS — K3184 Gastroparesis: Secondary | ICD-10-CM | POA: Diagnosis present

## 2017-11-26 DIAGNOSIS — Z79899 Other long term (current) drug therapy: Secondary | ICD-10-CM | POA: Diagnosis not present

## 2017-11-26 DIAGNOSIS — K219 Gastro-esophageal reflux disease without esophagitis: Secondary | ICD-10-CM | POA: Diagnosis present

## 2017-11-26 DIAGNOSIS — Z823 Family history of stroke: Secondary | ICD-10-CM | POA: Diagnosis not present

## 2017-11-26 DIAGNOSIS — I480 Paroxysmal atrial fibrillation: Secondary | ICD-10-CM | POA: Diagnosis not present

## 2017-11-26 DIAGNOSIS — I5021 Acute systolic (congestive) heart failure: Secondary | ICD-10-CM | POA: Diagnosis present

## 2017-11-26 DIAGNOSIS — E039 Hypothyroidism, unspecified: Secondary | ICD-10-CM | POA: Diagnosis present

## 2017-11-26 DIAGNOSIS — I5033 Acute on chronic diastolic (congestive) heart failure: Secondary | ICD-10-CM | POA: Diagnosis not present

## 2017-11-26 DIAGNOSIS — E1143 Type 2 diabetes mellitus with diabetic autonomic (poly)neuropathy: Secondary | ICD-10-CM | POA: Diagnosis present

## 2017-11-26 DIAGNOSIS — Z79890 Hormone replacement therapy (postmenopausal): Secondary | ICD-10-CM | POA: Diagnosis not present

## 2017-11-26 DIAGNOSIS — I272 Pulmonary hypertension, unspecified: Secondary | ICD-10-CM | POA: Diagnosis not present

## 2017-11-26 DIAGNOSIS — Z7951 Long term (current) use of inhaled steroids: Secondary | ICD-10-CM | POA: Diagnosis not present

## 2017-11-26 DIAGNOSIS — Z8249 Family history of ischemic heart disease and other diseases of the circulatory system: Secondary | ICD-10-CM | POA: Diagnosis not present

## 2017-11-26 DIAGNOSIS — Z7901 Long term (current) use of anticoagulants: Secondary | ICD-10-CM | POA: Diagnosis not present

## 2017-11-26 DIAGNOSIS — I110 Hypertensive heart disease with heart failure: Secondary | ICD-10-CM | POA: Diagnosis present

## 2017-11-26 DIAGNOSIS — E669 Obesity, unspecified: Secondary | ICD-10-CM | POA: Diagnosis present

## 2017-11-26 DIAGNOSIS — E785 Hyperlipidemia, unspecified: Secondary | ICD-10-CM | POA: Diagnosis present

## 2017-11-26 DIAGNOSIS — I2729 Other secondary pulmonary hypertension: Secondary | ICD-10-CM | POA: Diagnosis not present

## 2017-11-26 DIAGNOSIS — K589 Irritable bowel syndrome without diarrhea: Secondary | ICD-10-CM | POA: Diagnosis present

## 2017-11-26 DIAGNOSIS — G4733 Obstructive sleep apnea (adult) (pediatric): Secondary | ICD-10-CM | POA: Diagnosis present

## 2017-11-26 DIAGNOSIS — Z87891 Personal history of nicotine dependence: Secondary | ICD-10-CM | POA: Diagnosis not present

## 2017-11-26 DIAGNOSIS — R0602 Shortness of breath: Secondary | ICD-10-CM | POA: Diagnosis present

## 2017-11-26 LAB — GLUCOSE, CAPILLARY: Glucose-Capillary: 215 mg/dL — ABNORMAL HIGH (ref 65–99)

## 2017-11-26 MED ORDER — AMIODARONE HCL 200 MG PO TABS
200.0000 mg | ORAL_TABLET | Freq: Every morning | ORAL | Status: DC
  Administered 2017-11-27 – 2017-11-29 (×3): 200 mg via ORAL
  Filled 2017-11-26 (×3): qty 1

## 2017-11-26 MED ORDER — FUROSEMIDE 10 MG/ML IJ SOLN
60.0000 mg | Freq: Two times a day (BID) | INTRAMUSCULAR | Status: DC
  Administered 2017-11-26 – 2017-11-29 (×6): 60 mg via INTRAVENOUS
  Filled 2017-11-26 (×6): qty 6

## 2017-11-26 MED ORDER — SODIUM CHLORIDE 0.9% FLUSH
3.0000 mL | Freq: Two times a day (BID) | INTRAVENOUS | Status: DC
  Administered 2017-11-26 – 2017-11-29 (×5): 3 mL via INTRAVENOUS

## 2017-11-26 MED ORDER — ONDANSETRON HCL 4 MG PO TABS: 4.0000 mg | ORAL_TABLET | Freq: Three times a day (TID) | ORAL | Status: DC | PRN

## 2017-11-26 MED ORDER — ACETAMINOPHEN 325 MG PO TABS: 650.0000 mg | ORAL_TABLET | ORAL | Status: DC | PRN

## 2017-11-26 MED ORDER — LEVETIRACETAM 500 MG PO TABS
1000.0000 mg | ORAL_TABLET | Freq: Two times a day (BID) | ORAL | Status: DC
  Administered 2017-11-26 – 2017-11-29 (×6): 1000 mg via ORAL
  Filled 2017-11-26 (×6): qty 2

## 2017-11-26 MED ORDER — APIXABAN 5 MG PO TABS
5.0000 mg | ORAL_TABLET | Freq: Two times a day (BID) | ORAL | Status: DC
  Administered 2017-11-26 – 2017-11-29 (×6): 5 mg via ORAL
  Filled 2017-11-26 (×6): qty 1

## 2017-11-26 MED ORDER — POTASSIUM CHLORIDE CRYS ER 10 MEQ PO TBCR
10.0000 meq | EXTENDED_RELEASE_TABLET | Freq: Two times a day (BID) | ORAL | Status: DC
  Administered 2017-11-26 – 2017-11-29 (×6): 10 meq via ORAL
  Filled 2017-11-26 (×6): qty 1

## 2017-11-26 MED ORDER — LORAZEPAM 1 MG PO TABS: 1.0000 mg | ORAL_TABLET | Freq: Four times a day (QID) | ORAL | Status: DC | PRN

## 2017-11-26 MED ORDER — VITAMIN D 1000 UNITS PO TABS
2000.0000 [IU] | ORAL_TABLET | Freq: Every day | ORAL | Status: DC
  Administered 2017-11-26 – 2017-11-29 (×4): 2000 [IU] via ORAL
  Filled 2017-11-26 (×6): qty 2

## 2017-11-26 MED ORDER — MAGNESIUM OXIDE 400 (241.3 MG) MG PO TABS
400.0000 mg | ORAL_TABLET | Freq: Every day | ORAL | Status: DC
  Administered 2017-11-26 – 2017-11-29 (×4): 400 mg via ORAL
  Filled 2017-11-26 (×6): qty 1

## 2017-11-26 MED ORDER — INSULIN GLARGINE 100 UNIT/ML ~~LOC~~ SOLN
40.0000 [IU] | Freq: Every day | SUBCUTANEOUS | Status: DC
  Administered 2017-11-26 – 2017-11-28 (×3): 40 [IU] via SUBCUTANEOUS
  Filled 2017-11-26 (×4): qty 0.4

## 2017-11-26 MED ORDER — GABAPENTIN 400 MG PO CAPS
400.0000 mg | ORAL_CAPSULE | Freq: Three times a day (TID) | ORAL | Status: DC
  Administered 2017-11-26: 800 mg via ORAL
  Filled 2017-11-26 (×2): qty 2

## 2017-11-26 MED ORDER — INSULIN ASPART 100 UNIT/ML ~~LOC~~ SOLN
0.0000 [IU] | Freq: Three times a day (TID) | SUBCUTANEOUS | Status: DC
  Administered 2017-11-27: 3 [IU] via SUBCUTANEOUS
  Administered 2017-11-27 – 2017-11-28 (×3): 2 [IU] via SUBCUTANEOUS
  Administered 2017-11-28 – 2017-11-29 (×4): 3 [IU] via SUBCUTANEOUS

## 2017-11-26 MED ORDER — DULOXETINE HCL 30 MG PO CPEP
30.0000 mg | ORAL_CAPSULE | Freq: Two times a day (BID) | ORAL | Status: DC
  Administered 2017-11-26 – 2017-11-29 (×6): 30 mg via ORAL
  Filled 2017-11-26 (×6): qty 1

## 2017-11-26 MED ORDER — SODIUM CHLORIDE 0.9 % IV SOLN: 250.0000 mL | INTRAVENOUS | Status: DC | PRN

## 2017-11-26 MED ORDER — ADULT MULTIVITAMIN W/MINERALS CH
1.0000 | ORAL_TABLET | Freq: Every day | ORAL | Status: DC
  Administered 2017-11-26 – 2017-11-29 (×4): 1 via ORAL
  Filled 2017-11-26 (×4): qty 1

## 2017-11-26 MED ORDER — SODIUM CHLORIDE 0.9% FLUSH: 3.0000 mL | INTRAVENOUS | Status: DC | PRN

## 2017-11-26 MED ORDER — ACETAMINOPHEN 500 MG PO TABS
500.0000 mg | ORAL_TABLET | Freq: Four times a day (QID) | ORAL | Status: DC | PRN
  Administered 2017-11-28: 500 mg via ORAL
  Filled 2017-11-26: qty 1

## 2017-11-26 MED ORDER — LEVOTHYROXINE SODIUM 137 MCG PO TABS
137.0000 ug | ORAL_TABLET | Freq: Every day | ORAL | Status: DC
  Administered 2017-11-27 – 2017-11-29 (×3): 137 ug via ORAL
  Filled 2017-11-26 (×3): qty 1

## 2017-11-26 MED ORDER — ONDANSETRON HCL 4 MG/2ML IJ SOLN: 4.0000 mg | Freq: Four times a day (QID) | INTRAMUSCULAR | Status: DC | PRN

## 2017-11-26 NOTE — ED Notes (Addendum)
Lunch tray ordered for patient, heart healthy, okay'd by [place], [place]

## 2017-11-26 NOTE — ED Provider Notes (Signed)
[facility] EMERGENCY DEPARTMENT Provider Note   CSN: [id number] Arrival date & time: 11/25/17  1935     History   Chief Complaint Chief Complaint  Patient presents with  . abnormal labs    HPI [name] is a 67 y.o. female w PMHx chronic combined CHF w pulmonary HTN, GERD, HTN, HLD, PAF, presenting to the ED per recommendation of her Cardiologist, Dr. Kittie Plater and Ronie Spies PA. Pt presenting with abnormal lab result and worsening LE edema. Per chart review, patient seen by her cardiologist on 11/24/2017 for worsening lower extremity edema.  She was recently changed from Lasix to torsemide on 11/04/2017 for lower extremity edema and intermittent shortness of breath on exertion.  She had labs drawn at her cardiology appointment on the 24th which showed elevated creatinine.  She was then called by her cardiologist who recommended she report to the ED for IV diuresis with concern for borderline O2 saturations and renal function.  Patient reports intermittent shortness of breath on exertion, orthopnea, and progressively worsening lower extremity edema.  She states initially when she was switched to torsemide she lost weight, however has been slowly gaining it back.  She intermittently requires 2 L nasal cannula oxygen, and was 86% on arrival.  She denies chest pain or fever.  Endorses intermittent cough in the morning.  The history is provided by the patient and medical records.    Past Medical History:  Diagnosis Date  . Abnormal pulmonary function test   . Anemia    H/H of 10/30 with a normal MCV in 12/09  . Anxiety   . Arthritis   . Barrett's esophagus    Diagnosed 1995. Last EGD 2016-NO BARRETT'S.   . Chest pain    Negative cardiac catheterization in 2002; negative stress nuclear study in 2008  . Chronic anticoagulation   . Chronic combined systolic and diastolic CHF (congestive heart failure) (HCC)    a. EF predominantly normal during prior echoes but was 40%  during 10/2014 echo. b. Most recent 01/2015 EF normal, 55-60%.  . Chronic LBP    Surgical intervention in 1996  . Diabetes mellitus, type 2 (HCC)    Insulin therapy; exacerbated by prednisone  . Dysrhythmia    AFib  . Gastroparesis    99% retention 05/2008 on GES  . GERD (gastroesophageal reflux disease)   . Hiatal hernia   . Hyperlipidemia   . Hypertension   . Hypothyroid   . IBS (irritable bowel syndrome)   . Obesity   . OSA on CPAP    had CPAP and cannot tolerate.  . Paroxysmal atrial fibrillation (HCC)   . Pulmonary hypertension (HCC) 01/2015   a. Predominantly pulmonary venous hypertension but may be component of PAH.  . Seizures (HCC)    last seizure was 2 years ago; on keppra for this; unknown etiology  . Syncope    a. Admitted 05/2009; magnetic resonance imagin/ MRA - negative; etiology thought to be orthostasis secondary to drugs and dehydration. b. Syncope 02/2015 also felt 2/2 dehydration.    Patient Active Problem List   Diagnosis Date Noted  . Palliative care by specialist   . Goals of care, counseling/discussion   . Acute on chronic diastolic CHF (congestive heart failure) (HCC) 08/17/2017  . Acute respiratory failure with hypoxia and hypercapnia (HCC) 08/16/2017  . Uncontrolled type 2 diabetes mellitus with hyperglycemia (HCC) 08/16/2017  . Acute respiratory failure with hypercapnia (HCC)   . CAP Failed Outpatient Tx 08/05/2017  .  COPD exacerbation (HCC) 08/05/2017  . Pulmonary hypertension (HCC) 04/05/2017  . Derangement of posterior horn of medial meniscus of right knee   . Lateral meniscus, anterior horn derangement, right   . Syncope, near 12/26/2015  . Fall at home 12/26/2015  . Dysphagia 12/07/2015  . IDA (iron deficiency anemia) 03/13/2015  . Chronic diastolic CHF (congestive heart failure) (HCC) 02/20/2015  . Syncope and collapse 02/05/2015  . Dehydration   . Acute on chronic combined systolic and diastolic CHF (congestive heart failure) (HCC)  01/26/2015  . Low hemoglobin   . Diffuse abdominal pain 09/16/2014  . Ascites 08/27/2014  . Anemia 07/20/2014  . Epistaxis, recurrent 07/20/2014  . Syncope 04/11/2014  . Dementia 04/11/2014  . Chronic diarrhea 04/11/2014  . DM type 2 (diabetes mellitus, type 2) (HCC) 04/11/2014  . Protein-calorie malnutrition, severe (HCC) 04/11/2014  . Acute CHF (HCC) 12/21/2013  . Atrial fibrillation with RVR (HCC) 12/21/2013  . Chest pain at rest 12/03/2013  . Obesity 12/03/2013  . Chest pain 12/03/2013  . Diarrhea 08/26/2013  . Abnormal weight loss 08/26/2013  . Lower abdominal pain 08/26/2013  . Anorexia nervosa 08/26/2013  . Encounter for therapeutic drug monitoring 08/26/2013  . Dyspnea 06/29/2013  . Tremor 10/27/2012  . Chronic anticoagulation 06/23/2011  . HYPOTENSION, ORTHOSTATIC 08/26/2009  . Hyperlipidemia 03/04/2009  . Barrett's esophagus 09/26/2008  . Gastroparesis 09/26/2008  . Iron deficiency anemia 09/25/2008  . Essential hypertension 09/25/2008  . LUNG NODULE 09/25/2008  . Irritable bowel syndrome 09/25/2008  . Sleep apnea 09/25/2008  . CARPAL TUNNEL SYNDROME, HX OF 09/25/2008  . Hypothyroidism 04/10/2008  . DIABETES MELLITUS, TYPE II, ON INSULIN 04/10/2008  . OBESITY 04/10/2008  . Atrial fibrillation (HCC) 04/10/2008  . Congestive heart failure (HCC) 04/10/2008  . LOW BACK PAIN, CHRONIC 04/10/2008  . CHEST PAIN-PRECORDIAL 04/10/2008    Past Surgical History:  Procedure Laterality Date  . BACK SURGERY  1996  . BIOPSY N/A 11/08/2013   Procedure: BIOPSY  / Tissue sampling / ulcers present in small intestine;  Surgeon: West Bali, MD;  Location: [facility];  Service: Endoscopy;  Laterality: N/A;  . CARDIAC CATHETERIZATION  2002  . CARDIAC CATHETERIZATION N/A 01/26/2015   Procedure: Right Heart Cath;  Surgeon: Laurey Morale, MD;  Location: [facility] INVASIVE CV LAB;  Service: Cardiovascular;  Laterality: N/A;  . CARDIOVASCULAR STRESS TEST  2008   Stress nuclear study    . CARDIOVERSION N/A 03/06/2015   Procedure: CARDIOVERSION;  Surgeon: Laurey Morale, MD;  Location: [facility] ENDOSCOPY;  Service: Cardiovascular;  Laterality: N/A;  . CARPAL TUNNEL RELEASE  1994  . COLONOSCOPY  11/2011   Dr. Darrick Penna: Internal hemorrhoids, mild diverticulosis. Random colon biopsies negative.  . COLONOSCOPY N/A 11/08/2013   SLF: Normal mucosa in the terminal ileum/The colon IS redundant/  Moderate diverticulosis throughout the entire colon. ileum bx benign. colon bx benign  . ESOPHAGOGASTRODUODENOSCOPY  2008   Barrett's without dysplasia. esphagus dilated. antral erosions, h.pylori serologies negative.  . ESOPHAGOGASTRODUODENOSCOPY  11/2011   Dr. Darrick Penna: Barrett's esophagus, mild gastritis, diverticulum in the second portion of the duodenum repeat EGD 3 years. Small bowel biopsies negative. Gastric biopsy show reactive gastropathy but no H. pylori. Esophageal biopsies consistent with GERD. Next EGD 11/2014  . ESOPHAGOGASTRODUODENOSCOPY N/A 11/21/2014   ZOX:WRUE non-erosive gastritis/irregular z-line  . GIVENS CAPSULE STUDY  12/07/2011   Proximal small bowel, rare AVM. Distal small bowel, multiple ulcers noted  . GIVENS CAPSULE STUDY N/A 09/27/2013   Distal small bowel ulcers  extending to TI.  Marland Kitchen GIVENS CAPSULE STUDY N/A 10/10/2013   Procedure: GIVENS CAPSULE STUDY;  Surgeon: West Bali, MD;  Location: [facility];  Service: Endoscopy;  Laterality: N/A;  7:30  . KNEE ARTHROSCOPY WITH MEDIAL MENISECTOMY Right 06/09/2016   Procedure: KNEE ARTHROSCOPY WITH MEDIAL MENISECTOMY;  Surgeon: Vickki Hearing, MD;  Location: [facility] ORS;  Service: Orthopedics;  Laterality: Right;  medial and lateral menisectomy  . LAMINECTOMY  1995   L4-L5  . LAPAROSCOPIC CHOLECYSTECTOMY  1990s  . LEFT HEART CATHETERIZATION WITH CORONARY ANGIOGRAM  01/10/2014   Procedure: LEFT HEART CATHETERIZATION WITH CORONARY ANGIOGRAM;  Surgeon: Lesleigh Noe, MD;  Location: [facility] CATH LAB;  Service: Cardiovascular;;  . RIGHT HEART  CATHETERIZATION N/A 01/10/2014   Procedure: RIGHT HEART CATH;  Surgeon: Lesleigh Noe, MD;  Location: [facility] CATH LAB;  Service: Cardiovascular;  Laterality: N/A;  . TOTAL ABDOMINAL HYSTERECTOMY  1999  . TRACHEOSTOMY TUBE PLACEMENT N/A 09/01/2017   Procedure: TRACHEOSTOMY;  Surgeon: Drema Halon, MD;  Location[facility] OR;  Service: ENT;  Laterality: N/A;     OB History    Gravida  2   Para  2   Term  2   Preterm      AB      Living        SAB      TAB      Ectopic      Multiple      Live Births               Home Medications    Prior to Admission medications   Medication Sig Start Date End Date Taking? Authorizing Provider  acetaminophen (TYLENOL) 500 MG tablet Take 500 mg by mouth every 6 (six) hours as needed for moderate pain or headache.    [provider]  amiodarone (PACERONE) 200 MG tablet Take 1 tablet (200 mg total) by mouth every morning. 03/24/17   Branch, Dorothe Pea, MD  apixaban (ELIQUIS) 5 MG TABS tablet Take 1 tablet (5 mg total) by mouth 2 (two) times daily. 06/09/16   Vickki Hearing, MD  Cholecalciferol (VITAMIN D3) 2000 units capsule Take 2,000 Units by mouth daily. 10/25/17   [provider]  gabapentin (NEURONTIN) 400 MG capsule Take 400-800 mg by mouth 3 (three) times daily. Take 1 capsule(400mg ) by mouth twice a day and take 2 capsules(800mg ) by mouth at bedtime 07/18/17   [provider]  insulin aspart (NOVOLOG) 100 UNIT/ML injection Inject 0-20 Units into the skin every 4 (four) hours. CBG 0-120--no units; 121-150--3 units; 151-200--4 units; 201-250--7units; 251-300--11 units; 301-350--15 units; 351-400--20 units 08/22/17   Tat, David, MD  insulin glargine (LANTUS) 100 UNIT/ML injection Inject 0.55 mLs (55 Units total) into the skin at bedtime. 08/22/17   Catarina Hartshorn, MD  ipratropium-albuterol (DUONEB) 0.5-2.5 (3) MG/3ML SOLN Take 3 mLs by nebulization every 6 (six) hours. 08/22/17   Catarina Hartshorn, MD  levETIRAcetam  (KEPPRA) 1000 MG tablet Take 1,000 mg by mouth 2 (two) times daily. 07/18/17   [provider]  levothyroxine (SYNTHROID, LEVOTHROID) 137 MCG tablet Take 137 mcg by mouth daily. 10/14/14   [provider]  loperamide (IMODIUM) 2 MG capsule TAKE ONE CAPSULE BY MOUTH TWICE DAILY AS NEEDED 10/23/17   Gelene Mink, NP  magnesium oxide (MAG-OX) 400 MG tablet Take 400 mg by mouth daily.     [provider]  Multiple Vitamin (MULTIVITAMIN WITH MINERALS) TABS tablet Take 1  tablet by mouth daily.    [provider]  Nutritional Supplements (FEEDING SUPPLEMENT, VITAL HIGH PROTEIN,) LIQD liquid Run continuous at 55 cc/hour 08/22/17   Tat, David, MD  nystatin (MYCOSTATIN/NYSTOP) powder Apply topically 2 (two) times daily. To affected area. Keep dry. 11/24/17   Dunn, Tacey Ruiz, PA-C  ondansetron (ZOFRAN) 4 MG tablet TAKE ONE TABLET BY MOUTH FOUR TIMES DAILY AS NEEDED FOR NAUSEA 11/18/17   Tiffany Kocher, PA-C  pantoprazole (PROTONIX) 40 MG tablet TAKE ONE TABLET BY MOUTH TWICE DAILY BEFORE APPLY MEAL 02/07/17   Gelene Mink, NP  potassium chloride (K-DUR,KLOR-CON) 10 MEQ tablet TAKE TWO TABLETS BY MOUTH TWICE DAILY 04/20/17   Antoine Poche, MD  torsemide (DEMADEX) 20 MG tablet Take 2 tablets (40 mg total) by mouth daily. 11/03/17 02/01/18  Antoine Poche, MD    Family History Family History  Problem Relation Age of Onset  . Hypertension Mother   . Alzheimer's disease Mother   . Stroke Mother   . Heart attack Mother   . Hypertension Other   . Breast cancer Sister   . Heart disease Neg Hx   . Colon cancer Neg Hx     Social History Social History   Tobacco Use  . Smoking status: Former Smoker    Packs/day: 0.25    Years: 15.00    Pack years: 3.75    Types: Cigarettes    Start date: 02/26/1966    Last attempt to quit: 07/01/1983    Years since quitting: 34.4  . Smokeless tobacco: Never Used  . Tobacco comment: quit in 1984  Substance Use Topics  . Alcohol use:  No    Alcohol/week: 0.0 oz  . Drug use: No     Allergies   Celexa [citalopram hydrobromide]; Codeine; Dilaudid [hydromorphone hcl]; Reglan [metoclopramide]; and Ibuprofen   Review of Systems Review of Systems  Constitutional: Negative for fever.  Respiratory: Positive for cough and shortness of breath.   Cardiovascular: Positive for leg swelling. Negative for chest pain and palpitations.  All other systems reviewed and are negative.    Physical Exam Updated Vital Signs BP 128/69   Pulse (!) 58   Temp 98.3 F (36.8 C) (Oral)   Resp 13   Ht 5\' 6"  (1.676 m)   Wt 108.9 kg (240 lb)   SpO2 99%   BMI 38.74 kg/m   Physical Exam  Constitutional: She appears well-developed and well-nourished.  Obese female  HENT:  Head: Normocephalic and atraumatic.  Mouth/Throat: Oropharynx is clear and moist.  Eyes: Conjunctivae are normal.  Neck: Normal range of motion. Neck supple. No JVD present. No tracheal deviation present.  Cardiovascular: Normal rate, regular rhythm and intact distal pulses.  Pulmonary/Chest: Effort normal. No stridor. No respiratory distress. She has no wheezes.  Abdominal: Soft. Bowel sounds are normal. She exhibits no mass. There is no tenderness. There is no guarding.  Musculoskeletal:  2+  Pretibial pitting edema BLE.  Neurological: She is alert.  Skin: Skin is warm.  Psychiatric: She has a normal mood and affect. Her behavior is normal.  Nursing note and vitals reviewed.    ED Treatments / Results  Labs (all labs ordered are listed, but only abnormal results are displayed) Labs Reviewed  CBC - Abnormal; Notable for the following components:      Result Value   RBC 3.82 (*)    Hemoglobin 11.4 (*)    All other components within normal limits  BASIC METABOLIC PANEL - Abnormal;  Notable for the following components:   Chloride 98 (*)    Glucose, Bld 168 (*)    BUN 24 (*)    Creatinine, Ser 1.44 (*)    GFR calc non Af Amer 36 (*)    GFR calc Af Amer  42 (*)    All other components within normal limits  I-STAT TROPONIN, ED    EKG None  Radiology Dg Chest 2 View  Addendum Date: 11/25/2017   ADDENDUM REPORT: 11/25/2017 21:04 ADDENDUM: These results were called by telephone at the time of interpretation on 11/25/2017 at 8:57 pm to Dr. Lebron Conners PFEIFFER , who verbally acknowledged these results. Electronically Signed   By: Ted Mcalpine M.D.   On: 11/25/2017 21:04   Result Date: 11/25/2017 CLINICAL DATA:  Increasing shortness of breath. EXAM: CHEST - 2 VIEW COMPARISON:  08/27/2017 FINDINGS: The cardiac silhouette is enlarged. There is an apparent widening of the mediastinum. Mild calcific atherosclerotic disease of the aorta. Elevation of the left hemidiaphragm with patchy airspace consolidation versus atelectasis in left lower lobe. Osseous structures are without acute abnormality. Soft tissues are grossly normal. IMPRESSION: Apparent widening of the mediastinal silhouette. This may be due to shallow inspiration, however it may also be seen with aneurysmal dilation of the ascending aorta. If acute aortic syndrome as clinically suspected, CT angiogram of the chest should be considered. Enlarged cardiac silhouette. Elevation of the left hemidiaphragm with atelectasis versus airspace disease in the left lower lobe. Electronically Signed: By: Ted Mcalpine M.D. On: 11/25/2017 20:44    Procedures Procedures (including critical care time)  Medications Ordered in ED Medications - No data to display   Initial Impression / Assessment and Plan / ED Course  I have reviewed the triage vital signs and the nursing notes.  Pertinent labs & imaging results that were available during my care of the patient were reviewed by me and considered in my medical decision making (see chart for details).  Clinical Course as of Nov 27 1202  Sun Nov 26, 2017  1159 Image reviewed with Dr. Eudelia Bunch.  Comparison to previous chest x-ray, patient appears slightly  rotated in this image and likely accounts for the widened mediastinum.  Patient without chest pain, do not feel further imaging is necessary at this time.  DG Chest 2 View [JR]    Clinical Course User Index [JR] Robinson, [place] N, PA-C   Patient presenting to the ED per recommendation of her cardiologist, Dr. Gala Romney, for concerns for CHF with worsening renal function and increasing oxygen requirement, not significantly improved with torsemide.  On exam, patient with pretibial pitting edema bilaterally.  2 saturation 86% on room air on arrival to ED, however on my evaluation patient maintaining oxygen saturation without supplemental oxygen.  BMP with creatinine of 1.44, BUN 24.  EKG without acute changes.  Patient discussed with and evaluated by Dr. Eudelia Bunch.  Will consult cardiology.  Dr. Donnie Aho with cardiology to admit for IV diuresis and management.  Final Clinical Impressions(s) / ED Diagnoses   Final diagnoses:  AKI (acute kidney injury) (HCC)  Acute on chronic combined systolic and diastolic congestive heart failure [facility])    ED Discharge Orders    None       Robinson, [place] N, PA-C 11/26/17 1204    Nira Conn, MD 11/28/17 [phone]

## 2017-11-26 NOTE — Progress Notes (Signed)
Pt arrives to [facility], c/a/ox4. Pt has signed/held orders form March on her chart that will not be released by this nurse/this admission. MD may review.

## 2017-11-26 NOTE — ED Notes (Signed)
Patient returned to room 27 from CT scan.

## 2017-11-26 NOTE — ED Notes (Signed)
Patient transported to CT 

## 2017-11-26 NOTE — ED Notes (Signed)
Patient's daughter out to desk.  Asking for rationale for delays and current POC.  Also asked if patient's CBG could be checked due to having DM

## 2017-11-26 NOTE — ED Notes (Signed)
Patient and family want to leave; convinced them to stay for a while longer to see provider.

## 2017-11-26 NOTE — ED Notes (Signed)
Patient able to ambulate to the restroom with standby assist and cane.

## 2017-11-26 NOTE — H&P (Signed)
Cardiology Admission History and Physical:   Patient ID: [name]; [id number]; [birth year]   Admission date: 11/25/2017  Primary Care Provider: Gareth Morgan, MD Primary Cardiologist: Dr. Dina Rich  Chief Complaint: Weight gain on diuretics  Patient Profile:   [name] is a 67 y.o. female with a history of chronic combined heart failure, pulmonary hypertension, hypertension, hyperlipidemia, paroxysmal atrial fibrillation, type 2 diabetes mellitus, hypothyroidism, and OSA on CPAP.  History of Present Illness:   [name] was recently seen for a follow-up visit by [name] PA-C in the [place] office.  She had been evaluated earlier in the month by Dr. Wyline Mood with leg swelling and fluid gain, had been switched from Lasix to Demadex.  This apparently led to a decrease in rate from around 240 pounds to 232 pounds after about a week, but since that time she has had steady weight gain back to prior baseline.  She states her leg swelling has been intermittent, generally right worse than left.  Diuretic regimen was not changed at the recent visit but she was scheduled to have follow-up lab work.  BMET revealed increase in creatinine up to 1.44, previously 0.98 in early May.  [name] discussed the case with Dr. Gala [name] and recommendation was for her to present to the hospital in anticipation of admission for an IV diuretics.  Evaluation in the ER she is in no distress at rest.  She does report leg swelling, right worse than left.  No recent chest pain or palpitations.  Chest x-ray reported enlarged cardiac silhouette with elevated left hemidiaphragm and associated atelectasis, also widening of the mediastinum although in the setting of shallow inspiration.  Echocardiogram in February of this year revealed LVEF 55 to 60% range with grade 2 diastolic dysfunction and increased filling pressures, PASP estimated 57 mmHg.   Past Medical History:  Diagnosis Date  . Abnormal  pulmonary function test   . Anemia    H/H of 10/30 with a normal MCV in 12/09  . Anxiety   . Arthritis   . Barrett's esophagus    Diagnosed 1995. Last EGD 2016-NO BARRETT'S.   . Chest pain    Negative cardiac catheterization in 2002; negative stress nuclear study in 2008  . Chronic anticoagulation   . Chronic combined systolic and diastolic CHF (congestive heart failure) (HCC)    a. EF predominantly normal during prior echoes but was 40% during 10/2014 echo. b. Most recent 01/2015 EF normal, 55-60%.  . Chronic LBP    Surgical intervention in 1996  . Diabetes mellitus, type 2 (HCC)    Insulin therapy; exacerbated by prednisone  . Dysrhythmia    AFib  . Gastroparesis    99% retention 05/2008 on GES  . GERD (gastroesophageal reflux disease)   . Hiatal hernia   . Hyperlipidemia   . Hypertension   . Hypothyroid   . IBS (irritable bowel syndrome)   . Obesity   . OSA on CPAP    had CPAP and cannot tolerate.  . Paroxysmal atrial fibrillation (HCC)   . Pulmonary hypertension (HCC) 01/2015   a. Predominantly pulmonary venous hypertension but may be component of PAH.  . Seizures (HCC)    last seizure was 2 years ago; on keppra for this; unknown etiology  . Syncope    a. Admitted 05/2009; magnetic resonance imagin/ MRA - negative; etiology thought to be orthostasis secondary to drugs and dehydration. b. Syncope 02/2015 also felt 2/2 dehydration.    Past Surgical  History:  Procedure Laterality Date  . BACK SURGERY  1996  . BIOPSY N/A 11/08/2013   Procedure: BIOPSY  / Tissue sampling / ulcers present in small intestine;  Surgeon: West Bali, MD;  Location: [facility];  Service: Endoscopy;  Laterality: N/A;  . CARDIAC CATHETERIZATION  2002  . CARDIAC CATHETERIZATION N/A 01/26/2015   Procedure: Right Heart Cath;  Surgeon: Laurey Morale, MD;  Location: [facility] INVASIVE CV LAB;  Service: Cardiovascular;  Laterality: N/A;  . CARDIOVASCULAR STRESS TEST  2008   Stress nuclear study  .  CARDIOVERSION N/A 03/06/2015   Procedure: CARDIOVERSION;  Surgeon: Laurey Morale, MD;  Location: [facility] ENDOSCOPY;  Service: Cardiovascular;  Laterality: N/A;  . CARPAL TUNNEL RELEASE  1994  . COLONOSCOPY  11/2011   Dr. Darrick Penna: Internal hemorrhoids, mild diverticulosis. Random colon biopsies negative.  . COLONOSCOPY N/A 11/08/2013   SLF: Normal mucosa in the terminal ileum/The colon IS redundant/  Moderate diverticulosis throughout the entire colon. ileum bx benign. colon bx benign  . ESOPHAGOGASTRODUODENOSCOPY  2008   Barrett's without dysplasia. esphagus dilated. antral erosions, h.pylori serologies negative.  . ESOPHAGOGASTRODUODENOSCOPY  11/2011   Dr. Darrick Penna: Barrett's esophagus, mild gastritis, diverticulum in the second portion of the duodenum repeat EGD 3 years. Small bowel biopsies negative. Gastric biopsy show reactive gastropathy but no H. pylori. Esophageal biopsies consistent with GERD. Next EGD 11/2014  . ESOPHAGOGASTRODUODENOSCOPY N/A 11/21/2014   FFM:BWGY non-erosive gastritis/irregular z-line  . GIVENS CAPSULE STUDY  12/07/2011   Proximal small bowel, rare AVM. Distal small bowel, multiple ulcers noted  . GIVENS CAPSULE STUDY N/A 09/27/2013   Distal small bowel ulcers extending to TI.  Marland Kitchen GIVENS CAPSULE STUDY N/A 10/10/2013   Procedure: GIVENS CAPSULE STUDY;  Surgeon: West Bali, MD;  Location: [facility];  Service: Endoscopy;  Laterality: N/A;  7:30  . KNEE ARTHROSCOPY WITH MEDIAL MENISECTOMY Right 06/09/2016   Procedure: KNEE ARTHROSCOPY WITH MEDIAL MENISECTOMY;  Surgeon: Vickki Hearing, MD;  Location: [facility] ORS;  Service: Orthopedics;  Laterality: Right;  medial and lateral menisectomy  . LAMINECTOMY  1995   L4-L5  . LAPAROSCOPIC CHOLECYSTECTOMY  1990s  . LEFT HEART CATHETERIZATION WITH CORONARY ANGIOGRAM  01/10/2014   Procedure: LEFT HEART CATHETERIZATION WITH CORONARY ANGIOGRAM;  Surgeon: Lesleigh Noe, MD;  Location: [facility] CATH LAB;  Service: Cardiovascular;;  . RIGHT HEART  CATHETERIZATION N/A 01/10/2014   Procedure: RIGHT HEART CATH;  Surgeon: Lesleigh Noe, MD;  Location: [facility] CATH LAB;  Service: Cardiovascular;  Laterality: N/A;  . TOTAL ABDOMINAL HYSTERECTOMY  1999  . TRACHEOSTOMY TUBE PLACEMENT N/A 09/01/2017   Procedure: TRACHEOSTOMY;  Surgeon: Drema Halon, MD;  Location: [facility] OR;  Service: ENT;  Laterality: N/A;     Medications Prior to Admission: Prior to Admission medications   Medication Sig Start Date End Date Taking? Authorizing Provider  acetaminophen (TYLENOL) 500 MG tablet Take 500 mg by mouth every 6 (six) hours as needed for moderate pain or headache.   Yes [provider]  amiodarone (PACERONE) 200 MG tablet Take 1 tablet (200 mg total) by mouth every morning. 03/24/17  Yes Branch, Dorothe Pea, MD  apixaban (ELIQUIS) 5 MG TABS tablet Take 1 tablet (5 mg total) by mouth 2 (two) times daily. 06/09/16  Yes Vickki Hearing, MD  Cholecalciferol (VITAMIN D3) 2000 units capsule Take 2,000 Units by mouth daily. 10/25/17  Yes [provider]  DULoxetine (CYMBALTA) 30 MG capsule Take 30 mg by mouth 2 (  two) times daily. 11/24/17  Yes [provider]  gabapentin (NEURONTIN) 400 MG capsule Take 400-800 mg by mouth 3 (three) times daily. Take 1 capsule(400mg ) by mouth twice a day and take 2 capsules(800mg ) by mouth at bedtime 07/18/17  Yes [provider]  glipiZIDE (GLUCOTROL) 10 MG tablet Take 10 mg by mouth 2 (two) times daily. 11/24/17  Yes [provider]  insulin glargine (LANTUS) 100 UNIT/ML injection Inject 0.55 mLs (55 Units total) into the skin at bedtime. 08/22/17  Yes Tat, Onalee Hua, MD  levETIRAcetam (KEPPRA) 1000 MG tablet Take 1,000 mg by mouth 2 (two) times daily. 07/18/17  Yes [provider]  levothyroxine (SYNTHROID, LEVOTHROID) 137 MCG tablet Take 137 mcg by mouth daily. 10/14/14  Yes [provider]  loperamide (IMODIUM) 2 MG capsule TAKE ONE CAPSULE BY MOUTH TWICE DAILY AS NEEDED  10/23/17  Yes Gelene Mink, NP  LORazepam (ATIVAN) 1 MG tablet Take 1 mg by mouth 3 (three) times daily as needed. for anxiety 11/24/17  Yes [provider]  magnesium oxide (MAG-OX) 400 MG tablet Take 400 mg by mouth daily.    Yes [provider]  metFORMIN (GLUCOPHAGE) 500 MG tablet Take 500 mg by mouth 2 (two) times daily with a meal.   Yes [provider]  Multiple Vitamin (MULTIVITAMIN WITH MINERALS) TABS tablet Take 1 tablet by mouth daily.   Yes [provider]  nystatin (MYCOSTATIN/NYSTOP) powder Apply topically 2 (two) times daily. To affected area. Keep dry. 11/24/17  Yes [name], Dayna N, PA-C  ondansetron (ZOFRAN) 4 MG tablet TAKE ONE TABLET BY MOUTH FOUR TIMES DAILY AS NEEDED FOR NAUSEA 11/18/17  Yes Tiffany Kocher, PA-C  potassium chloride (K-DUR,KLOR-CON) 10 MEQ tablet TAKE TWO TABLETS BY MOUTH TWICE DAILY 04/20/17  Yes Branch, Dorothe Pea, MD  torsemide (DEMADEX) 20 MG tablet Take 2 tablets (40 mg total) by mouth daily. 11/03/17 02/01/18 Yes Branch, Dorothe Pea, MD  insulin aspart (NOVOLOG) 100 UNIT/ML injection Inject 0-20 Units into the skin every 4 (four) hours. CBG 0-120--no units; 121-150--3 units; 151-200--4 units; 201-250--7units; 251-300--11 units; 301-350--15 units; 351-400--20 units Patient not taking: Reported on 11/26/2017 08/22/17   Tat, Onalee Hua, MD  ipratropium-albuterol (DUONEB) 0.5-2.5 (3) MG/3ML SOLN Take 3 mLs by nebulization every 6 (six) hours. Patient not taking: Reported on 11/26/2017 08/22/17   Catarina Hartshorn, MD  Nutritional Supplements (FEEDING SUPPLEMENT, VITAL HIGH PROTEIN,) LIQD liquid Run continuous at 55 cc/hour Patient not taking: Reported on 11/26/2017 08/22/17   Catarina Hartshorn, MD  pantoprazole (PROTONIX) 40 MG tablet TAKE ONE TABLET BY MOUTH TWICE DAILY BEFORE APPLY MEAL Patient not taking: Reported on 11/26/2017 02/07/17   Gelene Mink, NP     Allergies:    Allergies  Allergen Reactions  . Celexa [Citalopram Hydrobromide] Other (See  Comments)    Dyskinesia  . Codeine Nausea And Vomiting and Other (See Comments)    HALLUCINATIONS  . Dilaudid [Hydromorphone Hcl] Other (See Comments)    Made her pass out  . Reglan [Metoclopramide] Other (See Comments)    DYSKINESIA  . Ibuprofen Nausea And Vomiting and Other (See Comments)    Upsets her stomach and causes abdominal pain    Social History:   Social History   Socioeconomic History  . Marital status: Married    Spouse name: Not on file  . Number of children: Not on file  . Years of education: Not on file  . Highest education level: Not on file  Occupational History  .  Occupation: Passenger transport manager at [employer]    Employer: [employer]    Comment: disabled    Employer: RETIRED  Social Needs  . Financial resource strain: Not on file  . Food insecurity:    Worry: Not on file    Inability: Not on file  . Transportation needs:    Medical: Not on file    Non-medical: Not on file  Tobacco Use  . Smoking status: Former Smoker    Packs/day: 0.25    Years: 15.00    Pack years: 3.75    Types: Cigarettes    Start date: 02/26/1966    Last attempt to quit: 07/01/1983    Years since quitting: 34.4  . Smokeless tobacco: Never Used  . Tobacco comment: quit in 1984  Substance and Sexual Activity  . Alcohol use: No    Alcohol/week: 0.0 oz  . Drug use: No  . Sexual activity: Yes    Birth control/protection: None, Surgical  Lifestyle  . Physical activity:    Days per week: Not on file    Minutes per session: Not on file  . Stress: Not on file  Relationships  . Social connections:    Talks on phone: Not on file    Gets together: Not on file    Attends religious service: Not on file    Active member of club or organization: Not on file    Attends meetings of clubs or organizations: Not on file    Relationship status: Not on file  . Intimate partner violence:    Fear of current or ex partner: Not on file    Emotionally abused: Not on file    Physically abused: Not on file      Forced sexual activity: Not on file  Other Topics Concern  . Not on file  Social History Narrative   Sedentary   4 children, "blended family"    Family History:  The patient's family history includes Alzheimer's disease in her mother; Breast cancer in her sister; Heart attack in her mother; Hypertension in her mother and other; Stroke in her mother. There is no history of Heart disease or Colon cancer.    ROS:  Please see the history of present illness.  All other ROS reviewed and negative.     Physical Exam/Data:   Vitals:   11/26/17 0539 11/26/17 0805 11/26/17 0930 11/26/17 1227  BP: 135/64 (!) 110/46 128/69 (!) 133/56  Pulse: (!) 57 80 (!) 58 65  Resp: 20 16 13 20   Temp:      TempSrc:      SpO2: 100% 100% 99% 96%  Weight:      Height:       No intake or output data in the 24 hours ending 11/26/17 1447 Filed Weights   11/25/17 2004  Weight: 240 lb (108.9 kg)   Body mass index is 38.74 kg/m.   Gen: Obese woman, no acute distress. HEENT: Conjunctiva and lids normal, oropharynx clear. Neck: Supple, no elevated JVP or carotid bruits, no thyromegaly. Lungs: Decreased breath sounds at the bases with scattered crackles, nonlabored breathing at rest. Cardiac: Regular rate and rhythm, no S3, soft systolic murmur, no pericardial rub. Abdomen: Soft, nontender, bowel sounds present. Extremities: 1-2+ lower leg edema, right worse than left, distal pulses 2+. Skin: Warm and dry. Musculoskeletal: No kyphosis. Neuropsychiatric: Alert and oriented x3, affect grossly appropriate.   EKG:  I personally reviewed the tracing from 11/25/2017 which showed sinus rhythm with left anterior fascicular block.  Relevant CV Studies:  Echocardiogram 08/17/2017: Study Conclusions  - Left ventricle: The cavity size was normal. Wall thickness was   increased in a pattern of moderate LVH. Systolic function was   normal. The estimated ejection fraction was in the range of 55%   to 60%. Wall  motion was normal; there were no regional wall   motion abnormalities. Features are consistent with a pseudonormal   left ventricular filling pattern, with concomitant abnormal   relaxation and increased filling pressure (grade 2 diastolic   dysfunction). Doppler parameters are consistent with high   ventricular filling pressure. - Aortic valve: Mildly calcified annulus. Trileaflet; normal   thickness leaflets. Valve area (VTI): 2.6 cm^2. Valve area   (Vmax): 2.49 cm^2. - Mitral valve: Moderately calcified annulus. Normal thickness   leaflets . - Left atrium: The atrium was moderately to severely dilated. - Right ventricle: Poorly visualized. Grossly appears enlarged with   normal function. - Right atrium: The atrium was moderately dilated. - Pulmonary arteries: Systolic pressure was moderately increased.   PA peak pressure: 57 mm Hg (S). - Technically difficult study.  Laboratory Data:  Chemistry Recent Labs  Lab 11/24/17 1555 11/25/17 2014  NA 137 136  K 4.5 4.5  CL 97* 98*  CO2 31 27  GLUCOSE 175* 168*  BUN 24* 24*  CREATININE 1.30* 1.44*  CALCIUM 8.8* 9.0  GFRNONAA 41* 36*  GFRAA 48* 42*  ANIONGAP 9 11    Recent Labs  Lab 11/24/17 1555  PROT 6.6  ALBUMIN 3.4*  AST 19  ALT 16  ALKPHOS 64  BILITOT 0.7   Hematology Recent Labs  Lab 11/24/17 1555 11/25/17 2014  WBC 7.0 8.8  RBC 3.65* 3.82*  HGB 11.0* 11.4*  HCT 35.0* 36.2  MCV 95.9 94.8  MCH 30.1 29.8  MCHC 31.4 31.5  RDW 14.9 14.9  PLT 213 208   Cardiac EnzymesNo results for input(s): TROPONINI in the last 168 hours.  Recent Labs  Lab 11/25/17 2029  TROPIPOC 0.05     Radiology/Studies:  Dg Chest 2 View  Addendum Date: 11/25/2017   ADDENDUM REPORT: 11/25/2017 21:04 ADDENDUM: These results were called by telephone at the time of interpretation on 11/25/2017 at 8:57 pm to Dr. Arby Barrette , who verbally acknowledged these results. Electronically Signed   By: Ted Mcalpine M.D.   On:  11/25/2017 21:04   Result Date: 11/25/2017 CLINICAL DATA:  Increasing shortness of breath. EXAM: CHEST - 2 VIEW COMPARISON:  08/27/2017 FINDINGS: The cardiac silhouette is enlarged. There is an apparent widening of the mediastinum. Mild calcific atherosclerotic disease of the aorta. Elevation of the left hemidiaphragm with patchy airspace consolidation versus atelectasis in left lower lobe. Osseous structures are without acute abnormality. Soft tissues are grossly normal. IMPRESSION: Apparent widening of the mediastinal silhouette. This may be due to shallow inspiration, however it may also be seen with aneurysmal dilation of the ascending aorta. If acute aortic syndrome as clinically suspected, CT angiogram of the chest should be considered. Enlarged cardiac silhouette. Elevation of the left hemidiaphragm with atelectasis versus airspace disease in the left lower lobe. Electronically Signed: By: Ted Mcalpine M.D. On: 11/25/2017 20:44    Assessment and Plan:   1.  Acute on chronic diastolic heart failure despite consistent use of outpatient Demadex and also associated with rising creatinine.  Could be component of cardiorenal syndrome.  She does have pulmonary hypertension but preserved RV function by echocardiogram in February.  2.  Paroxysmal atrial fibrillation, on amiodarone  and Eliquis as an outpatient.  She is in sinus rhythm at present.  3.  OSA on CPAP.  4.  Acute renal insufficiency, creatinine up from 0.98 to 1.44 from beginning of May until now.  5.  Widened mediastinum by chest x-ray in the absence of chest pain, could be associated to use shallow inspiration.  Discussed with patient and daughter, also reviewed recent office note, subsequent lab work, and telephone notes.  Plan is admission for IV diuresis, will convert her from Demadex to Lasix.  Follow-up BMET and urine output.  Continue Eliquis and amiodarone.  We will obtain a noncontrasted chest CT to better assess apparent  mediastinal widening, would hold off CTA unless absolutely necessary in light of acute renal insufficiency. Also follow-up echocardiogram to reassess cardiac structure and function, RV function and PA pressures.  If she does not respond clinically to IV diuresis may need to consider consultation with the advanced heart failure team for right heart catheterization.   Signed, Nona Dell, MD  11/26/2017 2:47 PM

## 2017-11-27 LAB — BASIC METABOLIC PANEL
Anion gap: 8 (ref 5–15)
BUN: 17 mg/dL (ref 6–20)
CALCIUM: 9 mg/dL (ref 8.9–10.3)
CO2: 30 mmol/L (ref 22–32)
CREATININE: 1.13 mg/dL — AB (ref 0.44–1.00)
Chloride: 104 mmol/L (ref 101–111)
GFR calc Af Amer: 57 mL/min — ABNORMAL LOW (ref >60)
GFR calc non Af Amer: 49 mL/min — ABNORMAL LOW (ref >60)
GLUCOSE: 234 mg/dL — AB (ref 65–99)
Potassium: 3.7 mmol/L (ref 3.5–5.1)
Sodium: 142 mmol/L (ref 135–145)

## 2017-11-27 LAB — GLUCOSE, CAPILLARY
Glucose-Capillary: 179 mg/dL — ABNORMAL HIGH (ref 65–99)
Glucose-Capillary: 206 mg/dL — ABNORMAL HIGH (ref 65–99)
Glucose-Capillary: 169 mg/dL — ABNORMAL HIGH (ref 65–99)
Glucose-Capillary: 258 mg/dL — ABNORMAL HIGH (ref 65–99)

## 2017-11-27 LAB — TROPONIN I: Troponin I: 0.05 ng/mL (ref <0.03)

## 2017-11-27 MED ORDER — GABAPENTIN 400 MG PO CAPS
400.0000 mg | ORAL_CAPSULE | Freq: Two times a day (BID) | ORAL | Status: DC
  Administered 2017-11-27 – 2017-11-29 (×6): 400 mg via ORAL
  Filled 2017-11-27 (×6): qty 1

## 2017-11-27 MED ORDER — GABAPENTIN 400 MG PO CAPS
800.0000 mg | ORAL_CAPSULE | Freq: Every day | ORAL | Status: DC
  Administered 2017-11-27 – 2017-11-28 (×2): 800 mg via ORAL
  Filled 2017-11-27 (×2): qty 2

## 2017-11-27 NOTE — Discharge Instructions (Addendum)

## 2017-11-27 NOTE — Progress Notes (Signed)
Troponin 0.05, nos s/s, MD notified, will continue to monitor, THanks Lavonda Jumbo RN.

## 2017-11-27 NOTE — Progress Notes (Signed)
Visited with patient regarding HCPOA.  Patient states she will talk with her husband and her daughter about it and she wants to stop putting it off.  She states that her CHF has worsened and she feels it is time to let her daughter be her HCPOA since she is already is her POA.  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  She is sharing many stories and realizes she is at the point to make better health choices.  Will help her complete AD once she talks with her family.     11/27/17 1245  Clinical Encounter Type  Visited With Patient  Visit Type Initial;Spiritual support  Spiritual Encounters  Spiritual Needs Literature (Advanced Directive)

## 2017-11-27 NOTE — Progress Notes (Signed)
Progress Note  Patient Name: [name] Date of Encounter: 11/27/2017  Primary Cardiologist: Dina Rich, MD   Subjective   Dyspnea improving; no chest pain  Inpatient Medications    Scheduled Meds: . amiodarone  200 mg Oral q morning - 10a  . apixaban  5 mg Oral BID  . cholecalciferol  2,000 Units Oral Daily  . DULoxetine  30 mg Oral BID  . furosemide  60 mg Intravenous Q12H  . gabapentin  400 mg Oral BID   And  . gabapentin  800 mg Oral QHS  . insulin aspart  0-9 Units Subcutaneous TID WC  . insulin glargine  40 Units Subcutaneous QHS  . levETIRAcetam  1,000 mg Oral BID  . levothyroxine  137 mcg Oral QAC breakfast  . magnesium oxide  400 mg Oral Daily  . multivitamin with minerals  1 tablet Oral Daily  . potassium chloride  10 mEq Oral BID  . sodium chloride flush  3 mL Intravenous Q12H   Continuous Infusions: . sodium chloride     PRN Meds: sodium chloride, acetaminophen, acetaminophen, LORazepam, ondansetron (ZOFRAN) IV, ondansetron, sodium chloride flush   Vital Signs    Vitals:   11/26/17 1639 11/26/17 2058 11/26/17 2342 11/27/17 0313  BP: (!) 110/52 (!) 144/87 (!) 130/49 (!) 145/62  Pulse: 77 71 79 67  Resp: 20 18 18 20   Temp: 98.1 F (36.7 C) 98 F (36.7 C) 98 F (36.7 C) 98.2 F (36.8 C)  TempSrc: Oral Oral Oral Oral  SpO2: 100% 98% 98% 100%  Weight: 237 lb 3.2 oz (107.6 kg)   232 lb 6.4 oz (105.4 kg)  Height: 5\' 6"  (1.676 m)       Intake/Output Summary (Last 24 hours) at 11/27/2017 0757 Last data filed at 11/27/2017 0500 Gross per 24 hour  Intake 200 ml  Output 1325 ml  Net -1125 ml   Filed Weights   11/25/17 2004 11/26/17 1639 11/27/17 0313  Weight: 240 lb (108.9 kg) 237 lb 3.2 oz (107.6 kg) 232 lb 6.4 oz (105.4 kg)    Telemetry    Sinus - Personally Reviewed  Physical Exam   GEN: No acute distress.   Neck: supple Cardiac: RRR Respiratory: Diminished BS bases GI: Soft, nontender, non-distended  MS: Trace edema Neuro:   Nonfocal  Psych: Normal affect   Labs    Chemistry Recent Labs  Lab 11/24/17 1555 11/25/17 2014 11/27/17 0406  NA 137 136 142  K 4.5 4.5 3.7  CL 97* 98* 104  CO2 31 27 30   GLUCOSE 175* 168* 234*  BUN 24* 24* 17  CREATININE 1.30* 1.44* 1.13*  CALCIUM 8.8* 9.0 9.0  PROT 6.6  --   --   ALBUMIN 3.4*  --   --   AST 19  --   --   ALT 16  --   --   ALKPHOS 64  --   --   BILITOT 0.7  --   --   GFRNONAA 41* 36* 49*  GFRAA 48* 42* 57*  ANIONGAP 9 11 8      Hematology Recent Labs  Lab 11/24/17 1555 11/25/17 2014  WBC 7.0 8.8  RBC 3.65* 3.82*  HGB 11.0* 11.4*  HCT 35.0* 36.2  MCV 95.9 94.8  MCH 30.1 29.8  MCHC 31.4 31.5  RDW 14.9 14.9  PLT 213 208    Cardiac Enzymes Recent Labs  Lab 11/27/17 0406  TROPONINI 0.05*    Recent Labs  Lab 11/25/17 2029  TROPIPOC 0.05     Radiology    Dg Chest 2 View  Addendum Date: 11/25/2017   ADDENDUM REPORT: 11/25/2017 21:04 ADDENDUM: These results were called by telephone at the time of interpretation on 11/25/2017 at 8:57 pm to Dr. Arby Barrette , who verbally acknowledged these results. Electronically Signed   By: Ted Mcalpine M.D.   On: 11/25/2017 21:04   Result Date: 11/25/2017 CLINICAL DATA:  Increasing shortness of breath. EXAM: CHEST - 2 VIEW COMPARISON:  08/27/2017 FINDINGS: The cardiac silhouette is enlarged. There is an apparent widening of the mediastinum. Mild calcific atherosclerotic disease of the aorta. Elevation of the left hemidiaphragm with patchy airspace consolidation versus atelectasis in left lower lobe. Osseous structures are without acute abnormality. Soft tissues are grossly normal. IMPRESSION: Apparent widening of the mediastinal silhouette. This may be due to shallow inspiration, however it may also be seen with aneurysmal dilation of the ascending aorta. If acute aortic syndrome as clinically suspected, CT angiogram of the chest should be considered. Enlarged cardiac silhouette. Elevation of the left  hemidiaphragm with atelectasis versus airspace disease in the left lower lobe. Electronically Signed: By: Ted Mcalpine M.D. On: 11/25/2017 20:44   Ct Chest Wo Contrast  Result Date: 11/26/2017 CLINICAL DATA:  Shortness of breath. EXAM: CT CHEST WITHOUT CONTRAST TECHNIQUE: Multidetector CT imaging of the chest was performed following the standard protocol without IV contrast. COMPARISON:  CT scan of October 11, 2016. FINDINGS: Cardiovascular: Atherosclerosis of thoracic aorta is noted without aneurysm formation. Cardiomegaly is noted. No pericardial effusion is noted. Mediastinum/Nodes: No enlarged mediastinal or axillary lymph nodes. Thyroid gland, trachea, and esophagus demonstrate no significant findings. Lungs/Pleura: No pneumothorax is noted. No significant pleural effusion is noted. Stable scarring is noted in left lung base and right upper lobe. No definite acute abnormality is noted. Upper Abdomen: No acute abnormality. Musculoskeletal: No chest wall mass or suspicious bone lesions identified. IMPRESSION: Stable bilateral lung scarring is noted. No definite acute abnormality seen. Aortic Atherosclerosis (ICD10-I70.0). Electronically Signed   By: Lupita Raider, M.D.   On: 11/26/2017 15:54    Patient Profile     67 y.o. female with past medical history of chronic diastolic congestive heart failure, paroxysmal atrial fibrillation, obstructive sleep apnea, diabetes mellitus, pulmonary hypertension, hypertension, hyperlipidemia admitted with acute on chronic diastolic congestive heart failure.  Assessment & Plan    1 acute on chronic diastolic congestive heart failure-patient improving this morning with decreased dyspnea. I/O-1125; weight 232.  Plan to continue present dose of IV Lasix.  Follow renal function.  Patient instructed on low-sodium diet and fluid restriction.  2 history of paroxysmal atrial fibrillation-patient remains in sinus rhythm.  Continue amiodarone.  Continue  apixaban.  3 hypertension-blood pressure is borderline.  Continue to follow and add medications as needed.  4 obstructive sleep apnea-continue CPAP. Fu pulmonary.  5 Wide mediastinum-noncontrast chest CT shows no aneurysm.  For questions or updates, please contact [facility] Please consult [email or web address] for contact info under Cardiology/STEMI.      Signed, Olga Millers, MD  11/27/2017, 7:57 AM

## 2017-11-27 NOTE — Progress Notes (Signed)
Patient resting comfortably during shift report. Denies complaints.  

## 2017-11-28 ENCOUNTER — Inpatient Hospital Stay (HOSPITAL_COMMUNITY): Payer: Medicare Other

## 2017-11-28 ENCOUNTER — Ambulatory Visit: Payer: Medicare Other | Admitting: Internal Medicine

## 2017-11-28 DIAGNOSIS — I5030 Unspecified diastolic (congestive) heart failure: Secondary | ICD-10-CM

## 2017-11-28 DIAGNOSIS — I5031 Acute diastolic (congestive) heart failure: Secondary | ICD-10-CM

## 2017-11-28 LAB — BASIC METABOLIC PANEL
ANION GAP: 9 (ref 5–15)
BUN: 13 mg/dL (ref 6–20)
CALCIUM: 9.1 mg/dL (ref 8.9–10.3)
CO2: 32 mmol/L (ref 22–32)
Chloride: 101 mmol/L (ref 101–111)
Creatinine, Ser: 1.17 mg/dL — ABNORMAL HIGH (ref 0.44–1.00)
GFR calc Af Amer: 54 mL/min — ABNORMAL LOW (ref >60)
GFR calc non Af Amer: 47 mL/min — ABNORMAL LOW (ref >60)
Glucose, Bld: 233 mg/dL — ABNORMAL HIGH (ref 65–99)
Potassium: 3.9 mmol/L (ref 3.5–5.1)
Sodium: 142 mmol/L (ref 135–145)

## 2017-11-28 LAB — GLUCOSE, CAPILLARY
GLUCOSE-CAPILLARY: 245 mg/dL — AB (ref 65–99)
Glucose-Capillary: 178 mg/dL — ABNORMAL HIGH (ref 65–99)
Glucose-Capillary: 205 mg/dL — ABNORMAL HIGH (ref 65–99)
Glucose-Capillary: 181 mg/dL — ABNORMAL HIGH (ref 65–99)
Glucose-Capillary: 209 mg/dL — ABNORMAL HIGH (ref 65–99)

## 2017-11-28 LAB — ECHOCARDIOGRAM COMPLETE
Height: 66 in
Weight: 3652.8 oz

## 2017-11-28 MED ORDER — LOPERAMIDE HCL 2 MG PO CAPS
2.0000 mg | ORAL_CAPSULE | Freq: Two times a day (BID) | ORAL | Status: DC
  Administered 2017-11-28 – 2017-11-29 (×3): 2 mg via ORAL
  Filled 2017-11-28 (×3): qty 1

## 2017-11-28 NOTE — Plan of Care (Signed)
  Problem: Activity: Goal: Risk for activity intolerance will decrease Outcome: Progressing   Problem: Skin Integrity: Goal: Risk for impaired skin integrity will decrease Outcome: Progressing   Problem: Activity: Goal: Ability to tolerate increased activity will improve Outcome: Progressing   

## 2017-11-28 NOTE — Progress Notes (Signed)
Results for RHYLYNN, RUDIE (MRN [id number]) as of 11/28/2017 13:03  Ref. Range 11/27/2017 11:47 11/27/2017 16:27 11/27/2017 21:36 11/28/2017 07:26 11/28/2017 11:31  Glucose-Capillary Latest Ref Range: 65 - 99 mg/dL 197 (H) 588 (H) 325 (H) 205 (H) 181 (H)  If blood sugars continue to be greater than 180 mg/dl, recommend increasing Novolog to MODERATE correction scale TID. Will continue to monitor blood sugars while in the hospital.  Smith Mince RN BSN CDE Diabetes Coordinator Pager: [phone]  8am-5pm

## 2017-11-28 NOTE — Care Management Note (Signed)
Case Management Note  Patient Details  Name: [name] MRN: [id number] Date of Birth: October 03, 1950  Subjective/Objective:   Pt admitted on 11/25/17 with acute on chronic diastolic CHF.  PTA, pt resided at home with daughter and spouse.  PCP is Dr. [name]; daughter states pt able to get Rx meds filled without difficulty.                      Action/Plan: Will request PT/OT consults to determine home needs.  CM will continue to follow for dc planning as pt progresses.  Expected Discharge Date:              Expected Discharge Plan:  Home w Home Health Services  In-House Referral:     Discharge planning Services  CM Consult  Post Acute Care Choice:    Choice offered to:     DME Arranged:    DME Agency:     HH Arranged:    HH Agency:     Status of Service:  In process, will continue to follow  If discussed at Long Length of Stay Meetings, dates discussed:    Additional Comments:  Glennon Mac, RN 11/28/2017, 4:11 PM

## 2017-11-28 NOTE — Progress Notes (Signed)
2D Echocardiogram has been performed.  Pieter Partridge 11/28/2017, 11:06 AM

## 2017-11-28 NOTE — Progress Notes (Signed)
Progress Note  Patient Name: [name] Date of Encounter: 11/28/2017  Primary Cardiologist: Dina Rich, MD   Subjective   Feels a little better. No CP, decreased SOB  Inpatient Medications    Scheduled Meds: . amiodarone  200 mg Oral q morning - 10a  . apixaban  5 mg Oral BID  . cholecalciferol  2,000 Units Oral Daily  . DULoxetine  30 mg Oral BID  . furosemide  60 mg Intravenous Q12H  . gabapentin  400 mg Oral BID   And  . gabapentin  800 mg Oral QHS  . insulin aspart  0-9 Units Subcutaneous TID WC  . insulin glargine  40 Units Subcutaneous QHS  . levETIRAcetam  1,000 mg Oral BID  . levothyroxine  137 mcg Oral QAC breakfast  . magnesium oxide  400 mg Oral Daily  . multivitamin with minerals  1 tablet Oral Daily  . potassium chloride  10 mEq Oral BID  . sodium chloride flush  3 mL Intravenous Q12H   Continuous Infusions: . sodium chloride     PRN Meds: sodium chloride, acetaminophen, acetaminophen, LORazepam, ondansetron (ZOFRAN) IV, ondansetron, sodium chloride flush   Vital Signs    Vitals:   11/27/17 1150 11/27/17 1955 11/28/17 0521 11/28/17 1027  BP: 138/69 104/64 134/64   Pulse: (!) 55 69 60   Resp: 18 18 18    Temp: 98.4 F (36.9 C) 98.4 F (36.9 C) 97.6 F (36.4 C)   TempSrc: Oral Oral Oral   SpO2: 100% 94% 100% 96%  Weight:   228 lb 4.8 oz (103.6 kg)   Height:        Intake/Output Summary (Last 24 hours) at 11/28/2017 1035 Last data filed at 11/28/2017 1000 Gross per 24 hour  Intake 1610 ml  Output 1000 ml  Net 610 ml   Filed Weights   11/26/17 1639 11/27/17 0313 11/28/17 0521  Weight: 237 lb 3.2 oz (107.6 kg) 232 lb 6.4 oz (105.4 kg) 228 lb 4.8 oz (103.6 kg)    Telemetry    NSR - Personally Reviewed  ECG    NSR - Personally Reviewed  Physical Exam   GEN: No acute distress.  obese Neck: No JVD Cardiac: RRR, no murmurs, rubs, or gallops.  Respiratory: Clear to auscultation bilaterally. GI: Soft, nontender, non-distended    MS: +LE edema; No deformity. Neuro:  Nonfocal  Psych: Normal affect   Labs    Chemistry Recent Labs  Lab 11/24/17 1555 11/25/17 2014 11/27/17 0406 11/28/17 0646  NA 137 136 142 142  K 4.5 4.5 3.7 3.9  CL 97* 98* 104 101  CO2 31 27 30  32  GLUCOSE 175* 168* 234* 233*  BUN 24* 24* 17 13  CREATININE 1.30* 1.44* 1.13* 1.17*  CALCIUM 8.8* 9.0 9.0 9.1  PROT 6.6  --   --   --   ALBUMIN 3.4*  --   --   --   AST 19  --   --   --   ALT 16  --   --   --   ALKPHOS 64  --   --   --   BILITOT 0.7  --   --   --   GFRNONAA 41* 36* 49* 47*  GFRAA 48* 42* 57* 54*  ANIONGAP 9 11 8 9      Hematology Recent Labs  Lab 11/24/17 1555 11/25/17 2014  WBC 7.0 8.8  RBC 3.65* 3.82*  HGB 11.0* 11.4*  HCT 35.0* 36.2  MCV  95.9 94.8  MCH 30.1 29.8  MCHC 31.4 31.5  RDW 14.9 14.9  PLT 213 208    Cardiac Enzymes Recent Labs  Lab 11/27/17 0406  TROPONINI 0.05*    Recent Labs  Lab 11/25/17 2029  TROPIPOC 0.05     BNPNo results for input(s): BNP, PROBNP in the last 168 hours.   DDimer No results for input(s): DDIMER in the last 168 hours.   Radiology    Ct Chest Wo Contrast  Result Date: 11/26/2017 CLINICAL DATA:  Shortness of breath. EXAM: CT CHEST WITHOUT CONTRAST TECHNIQUE: Multidetector CT imaging of the chest was performed following the standard protocol without IV contrast. COMPARISON:  CT scan of October 11, 2016. FINDINGS: Cardiovascular: Atherosclerosis of thoracic aorta is noted without aneurysm formation. Cardiomegaly is noted. No pericardial effusion is noted. Mediastinum/Nodes: No enlarged mediastinal or axillary lymph nodes. Thyroid gland, trachea, and esophagus demonstrate no significant findings. Lungs/Pleura: No pneumothorax is noted. No significant pleural effusion is noted. Stable scarring is noted in left lung base and right upper lobe. No definite acute abnormality is noted. Upper Abdomen: No acute abnormality. Musculoskeletal: No chest wall mass or suspicious bone  lesions identified. IMPRESSION: Stable bilateral lung scarring is noted. No definite acute abnormality seen. Aortic Atherosclerosis (ICD10-I70.0). Electronically Signed   By: Lupita Raider, M.D.   On: 11/26/2017 15:54    Cardiac Studies   ECHO normal EF. .   Patient Profile     67 y.o. female admitted with acute on chronic diastolic heart failure with paroxysmal A. fib currently in sinus rhythm, hypertension, OSA  Assessment & Plan    Acute on chronic diastolic heart failure - Stable.  Troponin 0 0.05 likely secondary to acute heart failure episode.  Not ACS.  TSH mildly elevated at 5.9 but free T4 is normal at 1.25.  Potassium stable 3.9.  Out 2.5 L yesterday.  Weight is down from 237-228. One more day  PAF  - NSR. Eliquis  OSA  - secondary pulm HTN  Note she was in [place] for several days.   For questions or updates, please contact [facility] Please consult [email or web address] for contact info under Cardiology/STEMI.      Signed, Donato Schultz, MD  11/28/2017, 10:35 AM

## 2017-11-29 ENCOUNTER — Ambulatory Visit: Payer: Medicare Other | Admitting: Internal Medicine

## 2017-11-29 DIAGNOSIS — I2729 Other secondary pulmonary hypertension: Secondary | ICD-10-CM

## 2017-11-29 DIAGNOSIS — G4733 Obstructive sleep apnea (adult) (pediatric): Secondary | ICD-10-CM

## 2017-11-29 LAB — BASIC METABOLIC PANEL
ANION GAP: 10 (ref 5–15)
BUN: 14 mg/dL (ref 6–20)
CALCIUM: 9.1 mg/dL (ref 8.9–10.3)
CO2: 32 mmol/L (ref 22–32)
Chloride: 99 mmol/L — ABNORMAL LOW (ref 101–111)
Creatinine, Ser: 1.08 mg/dL — ABNORMAL HIGH (ref 0.44–1.00)
GFR, EST AFRICAN AMERICAN: 60 mL/min — AB (ref >60)
GFR, EST NON AFRICAN AMERICAN: 52 mL/min — AB (ref >60)
Glucose, Bld: 223 mg/dL — ABNORMAL HIGH (ref 65–99)
Potassium: 3.7 mmol/L (ref 3.5–5.1)
Sodium: 141 mmol/L (ref 135–145)

## 2017-11-29 LAB — GLUCOSE, CAPILLARY
Glucose-Capillary: 205 mg/dL — ABNORMAL HIGH (ref 65–99)
Glucose-Capillary: 227 mg/dL — ABNORMAL HIGH (ref 65–99)

## 2017-11-29 MED ORDER — TORSEMIDE 20 MG PO TABS: ORAL_TABLET | ORAL | 3 refills | Status: DC

## 2017-11-29 NOTE — Progress Notes (Signed)
Reviewed discharge instructions with patient and patient's family member. Answered their questions. Pt is stable and ready for discharge.

## 2017-11-29 NOTE — Progress Notes (Signed)
Inpatient Diabetes Program Recommendations  AACE/ADA: New Consensus Statement on Inpatient Glycemic Control (2015)  Target Ranges:  Prepandial:   less than 140 mg/dL      Peak postprandial:   less than 180 mg/dL (1-2 hours)      Critically ill patients:  140 - 180 mg/dL   Lab Results  Component Value Date   GLUCAP 205 (H) 11/29/2017   HGBA1C 9.1 (H) 08/23/2017    Review of Glycemic ControlResults for ROZE, [place] (MRN [id number]) as of 11/29/2017 09:39  Ref. Range 11/28/2017 07:26 11/28/2017 11:31 11/28/2017 16:57 11/28/2017 21:52 11/29/2017 07:38  Glucose-Capillary Latest Ref Range: 65 - 99 mg/dL 536 (H) 644 (H) 034 (H) 245 (H) 205 (H)   Diabetes history: Type 2 DM  Outpatient Diabetes medications:  Glucotrol 10 mg bid, Lantus 55 units q HS, Metformin 500 mg bid Current orders for Inpatient glycemic control:  Novolog sensitive tid with meals, Lantus 40 units q HS Inpatient Diabetes Program Recommendations:   Please consider increasing Lantus to 50 units q HS.  Also consider increasing Novolog correction to moderate tid with meals.  Last A1C was in February.  Consider rechecking A1C while in the hospital.  Thanks,  Beryl Meager, RN, BC-ADM Inpatient Diabetes Coordinator Pager [phone] (8a-5p)

## 2017-11-29 NOTE — Care Management Note (Signed)
Case Management Note  Patient Details  Name: [name] MRN: [id number] Date of Birth: [birth year]  Subjective/Objective:    CHF, HTN, atrial fib, DM                Action/Plan: NCM spoke to pt and dtr at bedside. She has CHF, Living with Heart Failure booklet. States she does weigh daily and take medications. She plans to work on eating a Heart Healthy diet by reducing sodium and sugar intake. Has oxygen ([facility]), RW, four prong cane and bedside commode at home.   Expected Discharge Date:  11/29/17               Expected Discharge Plan:  Home/Self Care  In-House Referral:  NA  Discharge planning Services  CM Consult  Post Acute Care Choice:  NA Choice offered to:  NA  DME Arranged:  N/A(oxygen with [facility]) DME Agency:  NA  HH Arranged:  NA HH Agency:  NA  Status of Service:  Completed, signed off  If discussed at Long Length of Stay Meetings, dates discussed:    Additional Comments:  Elliot Cousin, RN 11/29/2017, 11:21 AM

## 2017-11-29 NOTE — Care Management Important Message (Signed)
Important Message  Patient Details  Name: [name] MRN: [id number] Date of Birth: 03/20/1951   Medicare Important Message Given:  Yes    Elliot Cousin, RN 11/29/2017, 11:21 AM

## 2017-11-29 NOTE — Evaluation (Signed)
Occupational Therapy Evaluation Patient Details Name: [name] MRN: [id number] DOB: 12/16/1950 Today's Date: 11/29/2017    History of Present Illness  67 y.o. female with a history of chronic combined heart failure, pulmonary hypertension, hypertension, hyperlipidemia, paroxysmal atrial fibrillation, type 2 diabetes mellitus, hypothyroidism, and OSA on CPAP. Presenting to ED for LE edema (right>left) while on diuretics   Clinical Impression   PTA, pt was living with her husband and daughter and was independent with ADLs. Pt currently performing ADLs and functional mobility at supervision level near baseline function. Pt and daughter reporting that there is good family support at home. Pt planning for dc later today. Recommend dc home once medically stable per physician. All acute OT needs met and sill sign off.    Follow Up Recommendations  No OT follow up;Supervision/Assistance - 24 hour    Equipment Recommendations  None recommended by OT    Recommendations for Other Services PT consult     Precautions / Restrictions Precautions Precautions: Fall Restrictions Weight Bearing Restrictions: No      Mobility Bed Mobility Overal bed mobility: Modified Independent             General bed mobility comments: Sitting at EOB upon arrival  Transfers Overall transfer level: Needs assistance Equipment used: Quad cane Transfers: Sit to/from Stand Sit to Stand: Supervision         General transfer comment: Supervision for safety. No phsycial A needed    Balance Overall balance assessment: Mild deficits observed, not formally tested                                         ADL either performed or assessed with clinical judgement   ADL Overall ADL's : Needs assistance/impaired Eating/Feeding: Set up;Sitting   Grooming: Wash/dry face;Wash/dry hands;Brushing hair;Set up;Supervision/safety;Standing Grooming Details (indicate cue type and reason): Pt  performing multiple grooming tasks at sink and demonstrating baseline balance and activity tolerance.  Upper Body Bathing: Set up;Supervision/ safety;Sitting   Lower Body Bathing: Sit to/from stand;Supervison/ safety   Upper Body Dressing : Set up;Supervision/safety;Sitting   Lower Body Dressing: Supervision/safety;Sit to/from stand Lower Body Dressing Details (indicate cue type and reason): Pt able to adjust socks at EOB with increased time. supervision for safety in standing Toilet Transfer: Supervision/safety;Ambulation(simulated in room)           Functional mobility during ADLs: Supervision/safety;Cane General ADL Comments: Pt performing ADLs and funcitonal mobiltiy with supervision for safety and increased time. Performing near baseline function     Vision         Perception     Praxis      Pertinent Vitals/Pain Pain Assessment: No/denies pain     Hand Dominance Right   Extremity/Trunk Assessment Upper Extremity Assessment Upper Extremity Assessment: Generalized weakness   Lower Extremity Assessment Lower Extremity Assessment: Defer to PT evaluation   Cervical / Trunk Assessment Cervical / Trunk Assessment: Other exceptions Cervical / Trunk Exceptions: Increased body habitus and lower back pain   Communication Communication Communication: No difficulties   Cognition Arousal/Alertness: Awake/alert Behavior During Therapy: WFL for tasks assessed/performed Overall Cognitive Status: Within Functional Limits for tasks assessed                                 General Comments: Pt becoming anxious at end of session when  talking about her prior illness and time in [place]   General Comments  Daughter present throughout session and verablizing that pt has good family support    Exercises     Shoulder Instructions      Home Living Family/patient expects to be discharged to:: Private residence Living Arrangements: Spouse/significant  other;Children Available Help at Discharge: Family;Available 24 hours/day Type of Home: House Home Access: Stairs to enter [employer] of Steps: 4 Entrance Stairs-Rails: Right;Left;Can reach both Home Layout: One level     Bathroom Shower/Tub: Occupational psychologist: Standard     Home Equipment: Toilet riser;Cane - quad;Bedside commode;Walker - 2 wheels;Shower seat   Additional Comments: Wears O2 as needed      Prior Functioning/Environment Level of Independence: Independent with assistive device(s)        Comments: ADLs and uses cane for community mobility. Daughter performs all IADLs        OT Problem List: Decreased activity tolerance;Impaired balance (sitting and/or standing);Increased edema      OT Treatment/Interventions:      OT Goals(Current goals can be found in the care plan section) Acute Rehab OT Goals Patient Stated Goal: go home today OT Goal Formulation: All assessment and education complete, DC therapy  OT Frequency:     Barriers to D/C:            Co-evaluation              AM-PAC PT "6 Clicks" Daily Activity     Outcome Measure Help from another person eating meals?: None Help from another person taking care of personal grooming?: None Help from another person toileting, which includes using toliet, bedpan, or urinal?: A Little Help from another person bathing (including washing, rinsing, drying)?: A Little Help from another person to put on and taking off regular upper body clothing?: None Help from another person to put on and taking off regular lower body clothing?: A Little 6 Click Score: 21   End of Session Equipment Utilized During Treatment: Gait belt(Cane) Nurse Communication: Mobility status  Activity Tolerance: Patient tolerated treatment well Patient left: (with PT in hall)  OT Visit Diagnosis: Other abnormalities of gait and mobility (R26.89);Muscle weakness (generalized) (M62.81)                 Time: 3790-2409 OT Time Calculation (min): 20 min Charges:  OT General Charges $OT Visit: 1 Visit OT Evaluation $OT Eval Moderate Complexity: 1 Mod G-Codes:     [place] MSOT, OTR/L Acute Rehab Pager: [phone] Office: [place] 11/29/2017, 10:57 AM

## 2017-11-29 NOTE — Evaluation (Signed)
Physical Therapy Evaluation and Discharge  Patient Details Name: [name] MRN: [id number] DOB: October 12, 1950 Today's Date: 11/29/2017   History of Present Illness   67 y.o. female with a history of chronic combined heart failure, pulmonary hypertension, hypertension, hyperlipidemia, paroxysmal atrial fibrillation, type 2 diabetes mellitus, hypothyroidism, and OSA on CPAP. Presenting to ED for LE edema (right>left) while on diuretics     Clinical Impression  Patient evaluated by Physical Therapy with no further acute PT needs identified. All education has been completed and the patient has no further questions. PTA, pt ambulating with quad cane, independent with ADLs. Patient peresntening at baseline, ambulating unit without assistance. SpO2 90% on RA after 80' of walking. See below for any follow-up Physical Therapy or equipment needs. PT is signing off. Thank you for this referral.     Follow Up Recommendations No PT follow up;Supervision for mobility/OOB    Equipment Recommendations  None recommended by PT    Recommendations for Other Services       Precautions / Restrictions        Mobility  Bed Mobility Overal bed mobility: Modified Independent                Transfers Overall transfer level: Modified independent Equipment used: Quad cane                Ambulation/Gait Ambulation/Gait assistance: Modified independent (Device/Increase time) Ambulation Distance (Feet): 100 Feet Assistive device: Quad cane Gait Pattern/deviations: WFL(Within Functional Limits)        Stairs Stairs: (defered 2/2 patient request)          Wheelchair Mobility    Modified Rankin (Stroke Patients Only)       Balance Overall balance assessment: Mild deficits observed, not formally tested                                           Pertinent Vitals/Pain Pain Assessment: No/denies pain    Home Living Family/patient expects to be discharged  to:: Private residence Living Arrangements: Spouse/significant other;Children Available Help at Discharge: Family;Available 24 hours/day Type of Home: House Home Access: Stairs to enter Entrance Stairs-Rails: Right;Left;Can reach both Entrance Stairs-Number of Steps: 4 Home Layout: One level Home Equipment: Toilet riser;Cane - quad;Bedside commode;Walker - 2 wheels;Shower seat Additional Comments: Wears O2 as needed    Prior Function Level of Independence: Independent with assistive device(s)         Comments: ADLs and uses cane for community mobility. Daughter performs all IADLs     Hand Dominance   Dominant Hand: Right    Extremity/Trunk Assessment   Upper Extremity Assessment Upper Extremity Assessment: Defer to OT evaluation    Lower Extremity Assessment Lower Extremity Assessment: (BLE strength 4-/5)       Communication   Communication: No difficulties  Cognition Arousal/Alertness: Awake/alert Behavior During Therapy: WFL for tasks assessed/performed Overall Cognitive Status: Within Functional Limits for tasks assessed                                        General Comments      Exercises     Assessment/Plan    PT Assessment Patent does not need any further PT services  PT Problem List         PT Treatment Interventions  PT Goals (Current goals can be found in the Care Plan section)  Acute Rehab PT Goals Patient Stated Goal: go home today PT Goal Formulation: With patient/family Time For Goal Achievement: 12/06/17 Potential to Achieve Goals: Good    Frequency     Barriers to discharge        Co-evaluation               AM-PAC PT "6 Clicks" Daily Activity  Outcome Measure Difficulty turning over in bed (including adjusting bedclothes, sheets and blankets)?: None Difficulty moving from lying on back to sitting on the side of the bed? : None Difficulty sitting down on and standing up from a chair with arms (e.g.,  wheelchair, bedside commode, etc,.)?: None Help needed moving to and from a bed to chair (including a wheelchair)?: None Help needed walking in hospital room?: None Help needed climbing 3-5 steps with a railing? : None 6 Click Score: 24    End of Session Equipment Utilized During Treatment: Gait belt Activity Tolerance: Patient tolerated treatment well Patient left: in bed;with call bell/phone within reach;with family/visitor present Nurse Communication: Mobility status PT Visit Diagnosis: Unsteadiness on feet (R26.81)    Time: 1610-9604 PT Time Calculation (min) (ACUTE ONLY): 20 min   Charges:   PT Evaluation $PT Eval Low Complexity: 1 Low PT Treatments $Gait Training: 8-22 mins   PT G Codes:      Etta Grandchild, PT, DPT Acute Rehab Services Pager: [phone]    Etta Grandchild 11/29/2017, 9:18 AM

## 2017-11-29 NOTE — Discharge Summary (Addendum)
Discharge Summary    Patient ID: [name],  MRN: [id number], DOB/AGE: 1951/03/13 67 y.o.  Admit date: 11/25/2017 Discharge date: 11/29/2017  Primary Care Provider: Gareth Morgan Primary Cardiologist: Dina Rich, MD  Discharge Diagnoses    Active Problems:   Acute systolic HF (heart failure) (HCC)   Allergies Allergies  Allergen Reactions  . Celexa [Citalopram Hydrobromide] Other (See Comments)    Dyskinesia  . Codeine Nausea And Vomiting and Other (See Comments)    HALLUCINATIONS  . Dilaudid [Hydromorphone Hcl] Other (See Comments)    Made her pass out  . Reglan [Metoclopramide] Other (See Comments)    DYSKINESIA  . Ibuprofen Nausea And Vomiting and Other (See Comments)    Upsets her stomach and causes abdominal pain    Diagnostic Studies/Procedures    Echocardiogram 11/28/17: Study Conclusions  - Left ventricle: The cavity size was normal. There was moderate   concentric hypertrophy. Systolic function was normal. The   estimated ejection fraction was in the range of 60% to 65%. Wall   motion was normal; there were no regional wall motion   abnormalities. Doppler parameters are consistent with abnormal   left ventricular relaxation (grade 1 diastolic dysfunction). - Aortic valve: Transvalvular velocity was within the normal range.   There was no stenosis. There was trivial regurgitation. - Mitral valve: Transvalvular velocity was within the normal range.   There was no evidence for stenosis. There was no regurgitation. - Left atrium: The atrium was severely dilated. - Right ventricle: The cavity size was normal. Wall thickness was   normal. Systolic function was normal. - Tricuspid valve: There was mild regurgitation. - Pulmonary arteries: PA peak pressure: 46 mm Hg (S). _____________   History of Present Illness     [name] was recently seen for a follow-up visit by [name] PA-C in the [place] office.  She had been evaluated earlier in  the month by Dr. Wyline Mood with leg swelling and fluid gain, had been switched from Lasix to Demadex.  This apparently led to a decrease in weight from around 240 pounds to 232 pounds after about a week, but since that time she has had steady weight gain back to prior baseline.  She states her leg swelling has been intermittent, generally right worse than left.  Diuretic regimen was not changed at the recent visit but she was scheduled to have follow-up lab work.  BMET revealed increase in creatinine up to 1.44, previously 0.98 in early May.  [name] discussed the case with Dr. Gala Romney and recommendation was for her to present to the hospital in anticipation of admission for an IV diuretics.  On evaluation in the ER she is in no distress at rest.  She does report leg swelling, right worse than left.  No recent chest pain or palpitations.  Chest x-ray reported enlarged cardiac silhouette with elevated left hemidiaphragm and associated atelectasis, also widening of the mediastinum although in the setting of shallow inspiration.  Echocardiogram in February of this year revealed LVEF 55 to 60% range with grade 2 diastolic dysfunction and increased filling pressures, PASP estimated 57 mmHg.    Hospital Course     Consultants: None   1. Acute on chronic diastolic CHF: presented with weight gain and LE edema. Diuresed with IV lasix with UOP net -1.4L this admission. Weight 237>229lbs on the day of discharge.  Cr stable - 1.08 on the day of discharge.  - Discharged home on demadex 40mg  in  AM and 20mg  in PM.   2. Secondary pulmonary HTN: echo this admission with PA pressure , improved from previous.  - Recommend continued management of sleep apnea  3. Paroxysmal atrial fibrillation: maintained sinus rhythm this admission - Continued amiodarone and eliquis.   4. DM type 2: maintained on ISS inpatient - Continued home medications at discharge   _____________  Discharge Vitals Blood  pressure 122/89, pulse 62, temperature 98.2 F (36.8 C), temperature source Oral, resp. rate 18, height 5\' 6"  (1.676 m), weight 229 lb 6.4 oz (104.1 kg), SpO2 97 %.  Filed Weights   11/27/17 0313 11/28/17 0521 11/29/17 0411  Weight: 232 lb 6.4 oz (105.4 kg) 228 lb 4.8 oz (103.6 kg) 229 lb 6.4 oz (104.1 kg)    Labs & Radiologic Studies    CBC No results for input(s): WBC, NEUTROABS, HGB, HCT, MCV, PLT in the last 72 hours. Basic Metabolic Panel Recent Labs    38/33/38 0646 11/29/17 0715  NA 142 141  K 3.9 3.7  CL 101 99*  CO2 32 32  GLUCOSE 233* 223*  BUN 13 14  CREATININE 1.17* 1.08*  CALCIUM 9.1 9.1   Liver Function Tests No results for input(s): AST, ALT, ALKPHOS, BILITOT, PROT, ALBUMIN in the last 72 hours. No results for input(s): LIPASE, AMYLASE in the last 72 hours. Cardiac Enzymes Recent Labs    11/27/17 0406  TROPONINI 0.05*   BNP Invalid input(s): POCBNP D-Dimer No results for input(s): DDIMER in the last 72 hours. Hemoglobin A1C No results for input(s): HGBA1C in the last 72 hours. Fasting Lipid Panel No results for input(s): CHOL, HDL, LDLCALC, TRIG, CHOLHDL, LDLDIRECT in the last 72 hours. Thyroid Function Tests No results for input(s): TSH, T4TOTAL, T3FREE, THYROIDAB in the last 72 hours.  Invalid input(s): FREET3 _____________  Dg Chest 2 View  Addendum Date: 11/25/2017   ADDENDUM REPORT: 11/25/2017 21:04 ADDENDUM: These results were called by telephone at the time of interpretation on 11/25/2017 at 8:57 pm to Dr. Lebron Conners PFEIFFER , who verbally acknowledged these results. Electronically Signed   By: Ted Mcalpine M.D.   On: 11/25/2017 21:04   Result Date: 11/25/2017 CLINICAL DATA:  Increasing shortness of breath. EXAM: CHEST - 2 VIEW COMPARISON:  08/27/2017 FINDINGS: The cardiac silhouette is enlarged. There is an apparent widening of the mediastinum. Mild calcific atherosclerotic disease of the aorta. Elevation of the left hemidiaphragm with  patchy airspace consolidation versus atelectasis in left lower lobe. Osseous structures are without acute abnormality. Soft tissues are grossly normal. IMPRESSION: Apparent widening of the mediastinal silhouette. This may be due to shallow inspiration, however it may also be seen with aneurysmal dilation of the ascending aorta. If acute aortic syndrome as clinically suspected, CT angiogram of the chest should be considered. Enlarged cardiac silhouette. Elevation of the left hemidiaphragm with atelectasis versus airspace disease in the left lower lobe. Electronically Signed: By: Ted Mcalpine M.D. On: 11/25/2017 20:44   Ct Chest Wo Contrast  Result Date: 11/26/2017 CLINICAL DATA:  Shortness of breath. EXAM: CT CHEST WITHOUT CONTRAST TECHNIQUE: Multidetector CT imaging of the chest was performed following the standard protocol without IV contrast. COMPARISON:  CT scan of October 11, 2016. FINDINGS: Cardiovascular: Atherosclerosis of thoracic aorta is noted without aneurysm formation. Cardiomegaly is noted. No pericardial effusion is noted. Mediastinum/Nodes: No enlarged mediastinal or axillary lymph nodes. Thyroid gland, trachea, and esophagus demonstrate no significant findings. Lungs/Pleura: No pneumothorax is noted. No significant pleural effusion is noted. Stable scarring is noted  in left lung base and right upper lobe. No definite acute abnormality is noted. Upper Abdomen: No acute abnormality. Musculoskeletal: No chest wall mass or suspicious bone lesions identified. IMPRESSION: Stable bilateral lung scarring is noted. No definite acute abnormality seen. Aortic Atherosclerosis (ICD10-I70.0). Electronically Signed   By: Lupita Raider, M.D.   On: 11/26/2017 15:54   Disposition   Patient was seen and examined by Dr. Anne Fu who deemed patient as stable for discharge. Follow-up has been arranged. Discharge medications as listed below.   Follow-up Plans & Appointments    Follow-up Information     Antoine Poche, MD Follow up on 12/06/2017.   Specialty:  Cardiology Why:  Please arrive 15 minutes early for your 3:00pm appointment Contact information: [address] [place] [address] [phone]          Discharge Instructions    Diet - low sodium heart healthy   Complete by:  As directed    Increase activity slowly   Complete by:  As directed       Discharge Medications   Allergies as of 11/29/2017      Reactions   Celexa [citalopram Hydrobromide] Other (See Comments)   Dyskinesia   Codeine Nausea And Vomiting, Other (See Comments)   HALLUCINATIONS   Dilaudid [hydromorphone Hcl] Other (See Comments)   Made her pass out   Reglan [metoclopramide] Other (See Comments)   DYSKINESIA   Ibuprofen Nausea And Vomiting, Other (See Comments)   Upsets her stomach and causes abdominal pain      Medication List    TAKE these medications   acetaminophen 500 MG tablet Commonly known as:  TYLENOL Take 500 mg by mouth every 6 (six) hours as needed for moderate pain or headache.   amiodarone 200 MG tablet Commonly known as:  PACERONE Take 1 tablet (200 mg total) by mouth every morning.   apixaban 5 MG Tabs tablet Commonly known as:  ELIQUIS Take 1 tablet (5 mg total) by mouth 2 (two) times daily.   DULoxetine 30 MG capsule Commonly known as:  CYMBALTA Take 30 mg by mouth 2 (two) times daily.   feeding supplement (VITAL HIGH PROTEIN) Liqd liquid Run continuous at 55 cc/hour   gabapentin 400 MG capsule Commonly known as:  NEURONTIN Take 400-800 mg by mouth 3 (three) times daily. Take 1 capsule(400mg ) by mouth twice a day and take 2 capsules(800mg ) by mouth at bedtime   glipiZIDE 10 MG tablet Commonly known as:  GLUCOTROL Take 10 mg by mouth 2 (two) times daily.   insulin aspart 100 UNIT/ML injection Commonly known as:  novoLOG Inject 0-20 Units into the skin every 4 (four) hours. CBG 0-120--no units; 121-150--3 units; 151-200--4 units;  201-250--7units; 251-300--11 units; 301-350--15 units; 351-400--20 units   insulin glargine 100 UNIT/ML injection Commonly known as:  LANTUS Inject 0.55 mLs (55 Units total) into the skin at bedtime.   ipratropium-albuterol 0.5-2.5 (3) MG/3ML Soln Commonly known as:  DUONEB Take 3 mLs by nebulization every 6 (six) hours.   levETIRAcetam 1000 MG tablet Commonly known as:  KEPPRA Take 1,000 mg by mouth 2 (two) times daily.   levothyroxine 137 MCG tablet Commonly known as:  SYNTHROID, LEVOTHROID Take 137 mcg by mouth daily.   loperamide 2 MG capsule Commonly known as:  IMODIUM TAKE ONE CAPSULE BY MOUTH TWICE DAILY AS NEEDED   LORazepam 1 MG tablet Commonly known as:  ATIVAN Take 1 mg by mouth 3 (three) times daily as needed.  for anxiety   magnesium oxide 400 MG tablet Commonly known as:  MAG-OX Take 400 mg by mouth daily.   metFORMIN 500 MG tablet Commonly known as:  GLUCOPHAGE Take 500 mg by mouth 2 (two) times daily with a meal.   multivitamin with minerals Tabs tablet Take 1 tablet by mouth daily.   nystatin powder Commonly known as:  MYCOSTATIN/NYSTOP Apply topically 2 (two) times daily. To affected area. Keep dry.   ondansetron 4 MG tablet Commonly known as:  ZOFRAN TAKE ONE TABLET BY MOUTH FOUR TIMES DAILY AS NEEDED FOR NAUSEA   pantoprazole 40 MG tablet Commonly known as:  PROTONIX TAKE ONE TABLET BY MOUTH TWICE DAILY BEFORE APPLY MEAL   potassium chloride 10 MEQ tablet Commonly known as:  K-DUR,KLOR-CON TAKE TWO TABLETS BY MOUTH TWICE DAILY   torsemide 20 MG tablet Commonly known as:  DEMADEX Take 2 tablets (40mg ) in the morning and 1 tablet (20mg ) in the evening every day. What changed:    how much to take  how to take this  when to take this  additional instructions   Vitamin D3 2000 units capsule Take 2,000 Units by mouth daily.          Outstanding Labs/Studies   None  Duration of Discharge Encounter   Greater than 30 minutes  including physician time.  Signed, Beatriz Stallion PA-C 11/29/2017, 10:28 AM   Personally seen and examined. Agree with above.   Primary Cardiologist: Dina Rich, MD   Subjective   Feels better, continuing to have decreased shortness of breath.  Maintaining sinus rhythm.  No chest pain.  Inpatient Medications    Scheduled Meds: . amiodarone  200 mg Oral q morning - 10a  . apixaban  5 mg Oral BID  . cholecalciferol  2,000 Units Oral Daily  . DULoxetine  30 mg Oral BID  . furosemide  60 mg Intravenous Q12H  . gabapentin  400 mg Oral BID   And  . gabapentin  800 mg Oral QHS  . insulin aspart  0-9 Units Subcutaneous TID WC  . insulin glargine  40 Units Subcutaneous QHS  . levETIRAcetam  1,000 mg Oral BID  . levothyroxine  137 mcg Oral QAC breakfast  . loperamide  2 mg Oral BID  . magnesium oxide  400 mg Oral Daily  . multivitamin with minerals  1 tablet Oral Daily  . potassium chloride  10 mEq Oral BID  . sodium chloride flush  3 mL Intravenous Q12H   Continuous Infusions: . sodium chloride     PRN Meds: sodium chloride, acetaminophen, acetaminophen, LORazepam, ondansetron (ZOFRAN) IV, ondansetron, sodium chloride flush   Vital Signs          Vitals:   11/28/17 1131 11/28/17 2155 11/29/17 0025 11/29/17 0411  BP: 125/68 123/64 120/62 122/89  Pulse: (!) 57 69 70 62  Resp:  18 18 18   Temp:  97.8 F (36.6 C) 98 F (36.7 C) 98.2 F (36.8 C)  TempSrc:  Oral Oral Oral  SpO2: 95% 99% 98% 97%  Weight:    229 lb 6.4 oz (104.1 kg)  Height:        Intake/Output Summary (Last 24 hours) at 11/29/2017 0811 Last data filed at 11/29/2017 0618    Gross per 24 hour  Intake 510 ml  Output 1375 ml  Net -865 ml        Filed Weights   11/27/17 0313 11/28/17 0521 11/29/17 0411  Weight: 232 lb 6.4 oz (105.4  kg) 228 lb 4.8 oz (103.6 kg) 229 lb 6.4 oz (104.1 kg)    Telemetry    Normal sinus rhythm- Personally Reviewed  ECG    No new EKG,  previous sinus rhythm- Personally Reviewed  Physical Exam   GEN:No acute distress.   Neck:No JVD Cardiac:RRR, no murmurs, rubs, or gallops.  Respiratory:Clear to auscultation bilaterally. ZO:XWRU, nontender, non-distended  MS: Minor chronic lower extremity edema; No deformity. Neuro:Nonfocal  Psych: Normal affect   Labs    Chemistry LastLabs  Recent Labs  Lab 11/24/17 1555 11/25/17 2014 11/27/17 0406 11/28/17 0646  NA 137 136 142 142  K 4.5 4.5 3.7 3.9  CL 97* 98* 104 101  CO2 31 27 30  32  GLUCOSE 175* 168* 234* 233*  BUN 24* 24* 17 13  CREATININE 1.30* 1.44* 1.13* 1.17*  CALCIUM 8.8* 9.0 9.0 9.1  PROT 6.6  --   --   --   ALBUMIN 3.4*  --   --   --   AST 19  --   --   --   ALT 16  --   --   --   ALKPHOS 64  --   --   --   BILITOT 0.7  --   --   --   GFRNONAA 41* 36* 49* 47*  GFRAA 48* 42* 57* 54*  ANIONGAP 9 11 8 9        Hematology LastLabs      Recent Labs  Lab 11/24/17 1555 11/25/17 2014  WBC 7.0 8.8  RBC 3.65* 3.82*  HGB 11.0* 11.4*  HCT 35.0* 36.2  MCV 95.9 94.8  MCH 30.1 29.8  MCHC 31.4 31.5  RDW 14.9 14.9  PLT 213 208      Cardiac Enzymes LastLabs     Recent Labs  Lab 11/27/17 0406  TROPONINI 0.05*      LastLabs     Recent Labs  Lab 11/25/17 2029  TROPIPOC 0.05       BNP LastLabs  No results for input(s): BNP, PROBNP in the last 168 hours.     DDimer  Beau Fanny  No results for input(s): DDIMER in the last 168 hours.     Radiology    ImagingResults(Last48hours)  No results found.    Cardiac Studies   Echocardiogram during this admission - Left ventricle: The cavity size was normal. There was moderate concentric hypertrophy. Systolic function was normal. The estimated ejection fraction was in the range of 60% to 65%. Wall motion was normal; there were no regional wall motion abnormalities. Doppler parameters are consistent with abnormal left ventricular relaxation  (grade 1 diastolic dysfunction). - Aortic valve: Transvalvular velocity was within the normal range. There was no stenosis. There was trivial regurgitation. - Mitral valve: Transvalvular velocity was within the normal range. There was no evidence for stenosis. There was no regurgitation. - Left atrium: The atrium was severely dilated. - Right ventricle: The cavity size was normal. Wall thickness was normal. Systolic function was normal. - Tricuspid valve: There was mild regurgitation. - Pulmonary arteries: PA peak pressure: 46 mm Hg (S).  Patient Profile     67 y.o. female admitted with acute on chronic diastolic heart failure, secondary pulmonary hypertension, obstructive sleep apnea, paroxysmal atrial fibrillation, essential hypertension  Assessment & Plan    Acute on chronic diastolic heart failure - Adequate diuresis current weight 229 from 237.  Cumulative net approximately 1.5 L out.  EF reassuring.  She was taking Demadex 40 mg p.o. daily at  home.  I would like for her to take 40 in the morning and 20 in the afternoon.  We will have close follow-up with basic metabolic profile to ensure that she does not have any signs of acute kidney injury.  Her renal function has improved throughout this hospitalization originally creatinine 1.3, down to 1.1 diuresis.  Secondary pulmonary hypertension - Per echocardiogram, PA pressure still remain elevated but improved.  Continue to treat sleep apnea, obesity  Diabetes with hypertension -Continue to treat as outpatient.  Paroxysmal atrial fibrillation -Continue with amiodarone, sinus rhythm.  Okay for discharge.  Have follow-up in 1 to 2 weeks with APP or Dr. Wyline Mood  For questions or updates, please contact [facility] Please consult [email or web address] for contact info under Cardiology/STEMI.      Signed, Donato Schultz, MD

## 2017-11-29 NOTE — Plan of Care (Signed)
  Problem: Health Behavior/Discharge Planning: Goal: Ability to manage health-related needs will improve Outcome: Progressing   Problem: Clinical Measurements: Goal: Ability to maintain clinical measurements within normal limits will improve Outcome: Progressing   Problem: Activity: Goal: Risk for activity intolerance will decrease Outcome: Progressing   Problem: Nutrition: Goal: Adequate nutrition will be maintained Outcome: Progressing   

## 2017-11-29 NOTE — Progress Notes (Signed)
Progress Note  Patient Name: [name] Date of Encounter: 11/29/2017  Primary Cardiologist: Dina Rich, MD   Subjective   Feels better, continuing to have decreased shortness of breath.  Maintaining sinus rhythm.  No chest pain.  Inpatient Medications    Scheduled Meds: . amiodarone  200 mg Oral q morning - 10a  . apixaban  5 mg Oral BID  . cholecalciferol  2,000 Units Oral Daily  . DULoxetine  30 mg Oral BID  . furosemide  60 mg Intravenous Q12H  . gabapentin  400 mg Oral BID   And  . gabapentin  800 mg Oral QHS  . insulin aspart  0-9 Units Subcutaneous TID WC  . insulin glargine  40 Units Subcutaneous QHS  . levETIRAcetam  1,000 mg Oral BID  . levothyroxine  137 mcg Oral QAC breakfast  . loperamide  2 mg Oral BID  . magnesium oxide  400 mg Oral Daily  . multivitamin with minerals  1 tablet Oral Daily  . potassium chloride  10 mEq Oral BID  . sodium chloride flush  3 mL Intravenous Q12H   Continuous Infusions: . sodium chloride     PRN Meds: sodium chloride, acetaminophen, acetaminophen, LORazepam, ondansetron (ZOFRAN) IV, ondansetron, sodium chloride flush   Vital Signs    Vitals:   11/28/17 1131 11/28/17 2155 11/29/17 0025 11/29/17 0411  BP: 125/68 123/64 120/62 122/89  Pulse: (!) 57 69 70 62  Resp:  18 18 18   Temp:  97.8 F (36.6 C) 98 F (36.7 C) 98.2 F (36.8 C)  TempSrc:  Oral Oral Oral  SpO2: 95% 99% 98% 97%  Weight:    229 lb 6.4 oz (104.1 kg)  Height:        Intake/Output Summary (Last 24 hours) at 11/29/2017 0811 Last data filed at 11/29/2017 0618 Gross per 24 hour  Intake 510 ml  Output 1375 ml  Net -865 ml   Filed Weights   11/27/17 0313 11/28/17 0521 11/29/17 0411  Weight: 232 lb 6.4 oz (105.4 kg) 228 lb 4.8 oz (103.6 kg) 229 lb 6.4 oz (104.1 kg)    Telemetry    Normal sinus rhythm- Personally Reviewed  ECG    No new EKG, previous sinus rhythm- Personally Reviewed  Physical Exam   GEN: No acute distress.   Neck: No  JVD Cardiac: RRR, no murmurs, rubs, or gallops.  Respiratory: Clear to auscultation bilaterally. GI: Soft, nontender, non-distended  MS:  Minor chronic lower extremity edema; No deformity. Neuro:  Nonfocal  Psych: Normal affect   Labs    Chemistry Recent Labs  Lab 11/24/17 1555 11/25/17 2014 11/27/17 0406 11/28/17 0646  NA 137 136 142 142  K 4.5 4.5 3.7 3.9  CL 97* 98* 104 101  CO2 31 27 30  32  GLUCOSE 175* 168* 234* 233*  BUN 24* 24* 17 13  CREATININE 1.30* 1.44* 1.13* 1.17*  CALCIUM 8.8* 9.0 9.0 9.1  PROT 6.6  --   --   --   ALBUMIN 3.4*  --   --   --   AST 19  --   --   --   ALT 16  --   --   --   ALKPHOS 64  --   --   --   BILITOT 0.7  --   --   --   GFRNONAA 41* 36* 49* 47*  GFRAA 48* 42* 57* 54*  ANIONGAP 9 11 8 9      Hematology Recent  Labs  Lab 11/24/17 1555 11/25/17 2014  WBC 7.0 8.8  RBC 3.65* 3.82*  HGB 11.0* 11.4*  HCT 35.0* 36.2  MCV 95.9 94.8  MCH 30.1 29.8  MCHC 31.4 31.5  RDW 14.9 14.9  PLT 213 208    Cardiac Enzymes Recent Labs  Lab 11/27/17 0406  TROPONINI 0.05*    Recent Labs  Lab 11/25/17 2029  TROPIPOC 0.05     BNPNo results for input(s): BNP, PROBNP in the last 168 hours.   DDimer No results for input(s): DDIMER in the last 168 hours.   Radiology    No results found.  Cardiac Studies   Echocardiogram during this admission - Left ventricle: The cavity size was normal. There was moderate   concentric hypertrophy. Systolic function was normal. The   estimated ejection fraction was in the range of 60% to 65%. Wall   motion was normal; there were no regional wall motion   abnormalities. Doppler parameters are consistent with abnormal   left ventricular relaxation (grade 1 diastolic dysfunction). - Aortic valve: Transvalvular velocity was within the normal range.   There was no stenosis. There was trivial regurgitation. - Mitral valve: Transvalvular velocity was within the normal range.   There was no evidence for  stenosis. There was no regurgitation. - Left atrium: The atrium was severely dilated. - Right ventricle: The cavity size was normal. Wall thickness was   normal. Systolic function was normal. - Tricuspid valve: There was mild regurgitation. - Pulmonary arteries: PA peak pressure: 46 mm Hg (S).  Patient Profile     67 y.o. female admitted with acute on chronic diastolic heart failure, secondary pulmonary hypertension, obstructive sleep apnea, paroxysmal atrial fibrillation, essential hypertension  Assessment & Plan    Acute on chronic diastolic heart failure - Adequate diuresis current weight 229 from 237.  Cumulative net approximately 1.5 L out.  EF reassuring.  She was taking Demadex 40 mg p.o. daily at home.  I would like for her to take 40 in the morning and 20 in the afternoon.  We will have close follow-up with basic metabolic profile to ensure that she does not have any signs of acute kidney injury.  Her renal function has improved throughout this hospitalization originally creatinine 1.3, down to 1.1 diuresis.  Secondary pulmonary hypertension - Per echocardiogram, PA pressure still remain elevated but improved.  Continue to treat sleep apnea, obesity  Diabetes with hypertension -Continue to treat as outpatient.  Paroxysmal atrial fibrillation -Continue with amiodarone, sinus rhythm.  Okay for discharge.  Have follow-up in 1 to 2 weeks with [facility] or Dr. Wyline Mood  For questions or updates, please contact [facility] Please consult [email or web address] for contact info under Cardiology/STEMI.      Signed, Donato Schultz, MD  11/29/2017, 8:11 AM

## 2017-11-30 ENCOUNTER — Inpatient Hospital Stay (HOSPITAL_COMMUNITY): Admission: RE | Admit: 2017-11-30 | Payer: Medicare Other | Source: Ambulatory Visit

## 2017-12-05 ENCOUNTER — Ambulatory Visit (HOSPITAL_COMMUNITY)
Admission: RE | Admit: 2017-12-05 | Discharge: 2017-12-05 | Disposition: A | Discharge status: Home / Self Care | Payer: Medicare Other | Source: Ambulatory Visit | Attending: Physician Assistant | Admitting: Physician Assistant

## 2017-12-05 DIAGNOSIS — R942 Abnormal results of pulmonary function studies: Secondary | ICD-10-CM | POA: Insufficient documentation

## 2017-12-05 LAB — PULMONARY FUNCTION TEST
DL/VA % pred: 65 %
DL/VA: 3.22 ml/min/mmHg/L
DLCO COR % PRED: 35 %
DLCO UNC: 8.36 ml/min/mmHg
DLCO cor: 8.96 ml/min/mmHg
DLCO unc % pred: 32 %
FEF 25-75 POST: 1.91 L/s
FEF 25-75 Pre: 1.66 L/sec
FEF2575-%Change-Post: 14 %
FEF2575-%PRED-POST: 95 %
FEF2575-%PRED-PRE: 82 %
FEV1-%Change-Post: 3 %
FEV1-%PRED-POST: 56 %
FEV1-%Pred-Pre: 54 %
FEV1-POST: 1.35 L
FEV1-Pre: 1.31 L
FEV1FVC-%CHANGE-POST: 3 %
FEV1FVC-%PRED-PRE: 110 %
FEV6-%CHANGE-POST: 0 %
FEV6-%Pred-Post: 51 %
FEV6-%Pred-Pre: 51 %
FEV6-Post: 1.55 L
FEV6-Pre: 1.55 L
FEV6FVC-%PRED-POST: 104 %
FEV6FVC-%Pred-Pre: 104 %
FVC-%CHANGE-POST: 0 %
FVC-%PRED-PRE: 49 %
FVC-%Pred-Post: 48 %
FVC-POST: 1.55 L
FVC-PRE: 1.55 L
POST FEV6/FVC RATIO: 100 %
PRE FEV1/FVC RATIO: 84 %
Post FEV1/FVC ratio: 87 %
Pre FEV6/FVC Ratio: 100 %
RV % pred: 75 %
RV: 1.68 L
TLC % PRED: 64 %
TLC: 3.34 L

## 2017-12-05 MED ORDER — ALBUTEROL SULFATE (2.5 MG/3ML) 0.083% IN NEBU
2.5000 mg | INHALATION_SOLUTION | Freq: Once | RESPIRATORY_TRACT | Status: AC
  Administered 2017-12-05: 2.5 mg via RESPIRATORY_TRACT

## 2017-12-06 ENCOUNTER — Ambulatory Visit (INDEPENDENT_AMBULATORY_CARE_PROVIDER_SITE_OTHER): Payer: Medicare Other | Admitting: Cardiology

## 2017-12-06 ENCOUNTER — Encounter: Payer: Self-pay | Admitting: Cardiology

## 2017-12-06 VITALS — BP 130/74 | HR 81 | Ht 66.0 in | Wt 235.6 lb

## 2017-12-06 DIAGNOSIS — I4891 Unspecified atrial fibrillation: Secondary | ICD-10-CM

## 2017-12-06 DIAGNOSIS — I5032 Chronic diastolic (congestive) heart failure: Secondary | ICD-10-CM

## 2017-12-06 DIAGNOSIS — I272 Pulmonary hypertension, unspecified: Secondary | ICD-10-CM | POA: Diagnosis not present

## 2017-12-06 NOTE — Progress Notes (Signed)
Clinical Summary Ms. Elliott is a 67 y.o.female seen today for follow up of the following medical problems.   1. Chronicsystolic/ diastolic CHF with mixed precap and post postcap Pulmonary Hypertension - echo 12/2013 PASP 85, moderate TR, RV mild to moderately dilated with normal function, could not eval diasotlic function due to afib, LVEF 50-55%. Biatrial enlargement.  - RHC 01/2015 PA 38/13 and calculated mean of 21, could not get a wedge however LVEDP 15. - repeat echo 10/2014 PASP 61 mmHg (down from 85 on prior echo), some evidence of RV dysfunction with RV TAPSE 1.4 and tissue anular systolic velocity of 7.5. Cannot evaluate diastolic function, however severe biatrial enlarement suggests significant dysfunction, - 01/2015 RHC mean PA 52, PCWP 31 - 01/2015 echo LVEF 55-60%, PASP 46  - after most recent RHC in 01/2015 she was admitted for diuresis. Discharge weight 170 lbs, reportedly down from 190 on admission. Marland Kitchen Discharged on lasix 60mg  bid.     - last visit we stopped lasix, started torsemide 40mg  daily due to weight gain and edema - admit 11/2017 to [facility] with volume overload. Diuresed 1.4 L, weight 237 to 229 lbs. Cr was stable. Discharged on torsemide 40mg  in AM and 20mg  in PM - 11/2017 echo LVEF 60-65%, no WMAs, grade I diastolic dysfunction, PASP 46.  - since discharge doing well. Swelling is improving in legs. Home weights 229 at discahrge, up to 232   2. Afib  - amio started 02/2015.  - has had severe diffusion dfect on PFTs 04/2015 prior - no recent palpitations.  3. OSA - could not tolerate CPAP   4. History of respiratory failure/prolonged intubation - admit earlier this year with pneumonia, respiratory failure requiring intubation.  - prolonged intubation, had trach.  - discharged to [facility]. Eventually off vent, trach closed.  - now home, working with PT    Past Medical History:  Diagnosis Date  . Abnormal pulmonary function test   . Anemia    H/H of  10/30 with a normal MCV in 12/09  . Anxiety   . Arthritis   . Barrett's esophagus    Diagnosed 1995. Last EGD 2016-NO BARRETT'S.   . Chest pain    Negative cardiac catheterization in 2002; negative stress nuclear study in 2008  . Chronic anticoagulation   . Chronic combined systolic and diastolic CHF (congestive heart failure) (HCC)    a. EF predominantly normal during prior echoes but was 40% during 10/2014 echo. b. Most recent 01/2015 EF normal, 55-60%.  . Chronic LBP    Surgical intervention in 1996  . Diabetes mellitus, type 2 (HCC)    Insulin therapy; exacerbated by prednisone  . Dysrhythmia    AFib  . Gastroparesis    99% retention 05/2008 on GES  . GERD (gastroesophageal reflux disease)   . Hiatal hernia   . Hyperlipidemia   . Hypertension   . Hypothyroid   . IBS (irritable bowel syndrome)   . Obesity   . OSA on CPAP    had CPAP and cannot tolerate.  . Paroxysmal atrial fibrillation (HCC)   . Pulmonary hypertension (HCC) 01/2015   a. Predominantly pulmonary venous hypertension but may be component of PAH.  . Seizures (HCC)    last seizure was 2 years ago; on keppra for this; unknown etiology  . Syncope    a. Admitted 05/2009; magnetic resonance imagin/ MRA - negative; etiology thought to be orthostasis secondary to drugs and dehydration. b. Syncope 02/2015 also felt 2/2  dehydration.     Allergies  Allergen Reactions  . Celexa [Citalopram Hydrobromide] Other (See Comments)    Dyskinesia  . Codeine Nausea And Vomiting and Other (See Comments)    HALLUCINATIONS  . Dilaudid [Hydromorphone Hcl] Other (See Comments)    Made her pass out  . Reglan [Metoclopramide] Other (See Comments)    DYSKINESIA  . Ibuprofen Nausea And Vomiting and Other (See Comments)    Upsets her stomach and causes abdominal pain     Current Outpatient Medications  Medication Sig Dispense Refill  . acetaminophen (TYLENOL) 500 MG tablet Take 500 mg by mouth every 6 (six) hours as needed for  moderate pain or headache.    Marland Kitchen amiodarone (PACERONE) 200 MG tablet Take 1 tablet (200 mg total) by mouth every morning. 90 tablet 3  . apixaban (ELIQUIS) 5 MG TABS tablet Take 1 tablet (5 mg total) by mouth 2 (two) times daily. 60 tablet 0  . Cholecalciferol (VITAMIN D3) 2000 units capsule Take 2,000 Units by mouth daily.  11  . DULoxetine (CYMBALTA) 30 MG capsule Take 30 mg by mouth 2 (two) times daily.  0  . gabapentin (NEURONTIN) 400 MG capsule Take 400-800 mg by mouth 3 (three) times daily. Take 1 capsule(400mg ) by mouth twice a day and take 2 capsules(800mg ) by mouth at bedtime  11  . glipiZIDE (GLUCOTROL) 10 MG tablet Take 10 mg by mouth 2 (two) times daily.  11  . insulin aspart (NOVOLOG) 100 UNIT/ML injection Inject 0-20 Units into the skin every 4 (four) hours. CBG 0-120--no units; 121-150--3 units; 151-200--4 units; 201-250--7units; 251-300--11 units; 301-350--15 units; 351-400--20 units 10 mL 0  . insulin glargine (LANTUS) 100 UNIT/ML injection Inject 0.55 mLs (55 Units total) into the skin at bedtime. 10 mL 0  . levETIRAcetam (KEPPRA) 1000 MG tablet Take 1,000 mg by mouth 2 (two) times daily.  1  . levothyroxine (SYNTHROID, LEVOTHROID) 137 MCG tablet Take 137 mcg by mouth daily.    Marland Kitchen loperamide (IMODIUM) 2 MG capsule TAKE ONE CAPSULE BY MOUTH TWICE DAILY AS NEEDED 60 capsule 3  . LORazepam (ATIVAN) 1 MG tablet Take 1 mg by mouth 3 (three) times daily as needed. for anxiety  5  . magnesium oxide (MAG-OX) 400 MG tablet Take 400 mg by mouth daily.     . metFORMIN (GLUCOPHAGE) 500 MG tablet Take 500 mg by mouth 2 (two) times daily with a meal.    . Multiple Vitamin (MULTIVITAMIN WITH MINERALS) TABS tablet Take 1 tablet by mouth daily.    Marland Kitchen nystatin (MYCOSTATIN/NYSTOP) powder Apply topically 2 (two) times daily. To affected area. Keep dry. 15 g 0  . ondansetron (ZOFRAN) 4 MG tablet TAKE ONE TABLET BY MOUTH FOUR TIMES DAILY AS NEEDED FOR NAUSEA 120 tablet 1  . pantoprazole (PROTONIX) 40 MG  tablet TAKE ONE TABLET BY MOUTH TWICE DAILY BEFORE APPLY MEAL 180 tablet 3  . potassium chloride (K-DUR,KLOR-CON) 10 MEQ tablet TAKE TWO TABLETS BY MOUTH TWICE DAILY 120 tablet 11  . torsemide (DEMADEX) 20 MG tablet Take 2 tablets (40mg ) in the morning and 1 tablet (20mg ) in the evening every day. 90 tablet 3   No current facility-administered medications for this visit.      Past Surgical History:  Procedure Laterality Date  . BACK SURGERY  1996  . BIOPSY N/A 11/08/2013   Procedure: BIOPSY  / Tissue sampling / ulcers present in small intestine;  Surgeon: West Bali, MD;  Location: [facility];  Service:  Endoscopy;  Laterality: N/A;  . CARDIAC CATHETERIZATION  2002  . CARDIAC CATHETERIZATION N/A 01/26/2015   Procedure: Right Heart Cath;  Surgeon: Laurey Morale, MD;  Location: [facility] INVASIVE CV LAB;  Service: Cardiovascular;  Laterality: N/A;  . CARDIOVASCULAR STRESS TEST  2008   Stress nuclear study  . CARDIOVERSION N/A 03/06/2015   Procedure: CARDIOVERSION;  Surgeon: Laurey Morale, MD;  Location: [facility] ENDOSCOPY;  Service: Cardiovascular;  Laterality: N/A;  . CARPAL TUNNEL RELEASE  1994  . COLONOSCOPY  11/2011   Dr. Darrick Penna: Internal hemorrhoids, mild diverticulosis. Random colon biopsies negative.  . COLONOSCOPY N/A 11/08/2013   SLF: Normal mucosa in the terminal ileum/The colon IS redundant/  Moderate diverticulosis throughout the entire colon. ileum bx benign. colon bx benign  . ESOPHAGOGASTRODUODENOSCOPY  2008   Barrett's without dysplasia. esphagus dilated. antral erosions, h.pylori serologies negative.  . ESOPHAGOGASTRODUODENOSCOPY  11/2011   Dr. Darrick Penna: Barrett's esophagus, mild gastritis, diverticulum in the second portion of the duodenum repeat EGD 3 years. Small bowel biopsies negative. Gastric biopsy show reactive gastropathy but no H. pylori. Esophageal biopsies consistent with GERD. Next EGD 11/2014  . ESOPHAGOGASTRODUODENOSCOPY N/A 11/21/2014   ZOX:WRUE non-erosive  gastritis/irregular z-line  . GIVENS CAPSULE STUDY  12/07/2011   Proximal small bowel, rare AVM. Distal small bowel, multiple ulcers noted  . GIVENS CAPSULE STUDY N/A 09/27/2013   Distal small bowel ulcers extending to TI.  Marland Kitchen GIVENS CAPSULE STUDY N/A 10/10/2013   Procedure: GIVENS CAPSULE STUDY;  Surgeon: West Bali, MD;  Location: [facility];  Service: Endoscopy;  Laterality: N/A;  7:30  . KNEE ARTHROSCOPY WITH MEDIAL MENISECTOMY Right 06/09/2016   Procedure: KNEE ARTHROSCOPY WITH MEDIAL MENISECTOMY;  Surgeon: Vickki Hearing, MD;  Location: [facility] ORS;  Service: Orthopedics;  Laterality: Right;  medial and lateral menisectomy  . LAMINECTOMY  1995   L4-L5  . LAPAROSCOPIC CHOLECYSTECTOMY  1990s  . LEFT HEART CATHETERIZATION WITH CORONARY ANGIOGRAM  01/10/2014   Procedure: LEFT HEART CATHETERIZATION WITH CORONARY ANGIOGRAM;  Surgeon: Lesleigh Noe, MD;  Location: [facility] CATH LAB;  Service: Cardiovascular;;  . RIGHT HEART CATHETERIZATION N/A 01/10/2014   Procedure: RIGHT HEART CATH;  Surgeon: Lesleigh Noe, MD;  Location: [facility] CATH LAB;  Service: Cardiovascular;  Laterality: N/A;  . TOTAL ABDOMINAL HYSTERECTOMY  1999  . TRACHEOSTOMY TUBE PLACEMENT N/A 09/01/2017   Procedure: TRACHEOSTOMY;  Surgeon: Drema Halon, MD;  Location: [facility] OR;  Service: ENT;  Laterality: N/A;     Allergies  Allergen Reactions  . Celexa [Citalopram Hydrobromide] Other (See Comments)    Dyskinesia  . Codeine Nausea And Vomiting and Other (See Comments)    HALLUCINATIONS  . Dilaudid [Hydromorphone Hcl] Other (See Comments)    Made her pass out  . Reglan [Metoclopramide] Other (See Comments)    DYSKINESIA  . Ibuprofen Nausea And Vomiting and Other (See Comments)    Upsets her stomach and causes abdominal pain      Family History  Problem Relation Age of Onset  . Hypertension Mother   . Alzheimer's disease Mother   . Stroke Mother   . Heart attack Mother   . Hypertension Other   . Breast cancer  Sister   . Heart disease Neg Hx   . Colon cancer Neg Hx      Social History Ms. Pla reports that she quit smoking about 34 years ago. Her smoking use included cigarettes. She started smoking about 51 years ago. She has a 3.75 pack-year  smoking history. She has never used smokeless tobacco. Ms. Mcconathy reports that she does not drink alcohol.   Review of Systems CONSTITUTIONAL: No weight loss, fever, chills, weakness or fatigue.  HEENT: Eyes: No visual loss, blurred vision, double vision or yellow sclerae.No hearing loss, sneezing, congestion, runny nose or sore throat.  SKIN: No rash or itching.  CARDIOVASCULAR: per hpi RESPIRATORY: per hpi GASTROINTESTINAL: No anorexia, nausea, vomiting or diarrhea. No abdominal pain or blood.  GENITOURINARY: No burning on urination, no polyuria NEUROLOGICAL: No headache, dizziness, syncope, paralysis, ataxia, numbness or tingling in the extremities. No change in bowel or bladder control.  MUSCULOSKELETAL: No muscle, back pain, joint pain or stiffness.  LYMPHATICS: No enlarged nodes. No history of splenectomy.  PSYCHIATRIC: No history of depression or anxiety.  ENDOCRINOLOGIC: No reports of sweating, cold or heat intolerance. No polyuria or polydipsia.  Marland Kitchen   Physical Examination Vitals:   12/06/17 1454  BP: 130/74  Pulse: 81  SpO2: 95%   Filed Weights   12/06/17 1454  Weight: 235 lb 9.6 oz (106.9 kg)    Gen: resting comfortably, no acute distress HEENT: no scleral icterus, pupils equal round and reactive, no palptable cervical adenopathy,  CV: RRR, no m/r/g, no jvd Resp: Clear to auscultation bilaterally GI: abdomen is soft, non-tender, non-distended, normal bowel sounds, no hepatosplenomegaly MSK: extremities are warm, no edema.  Skin: warm, no rash Neuro:  no focal deficits Psych: appropriate affect   Diagnostic Studies 12/2013 Echo Study Conclusions  - Limited study to evaluate LV systolic function. - Left ventricle: The  cavity size was normal. Wall thickness was normal. Systolic function was normal. The estimated ejection fraction was in the range of 50% to 55%. Wall motion was normal; there were no regional wall motion abnormalities. The study was not technically sufficient to allow evaluation of LV diastolic dysfunction due to atrial fibrillation. - Aortic valve: Valve area (VTI): 2.49 cm^2. Valve area (Vmax): 2.11 cm^2. - Mitral valve: There was moderate regurgitation. The MR vena contracta is 0.4 cm. - Left atrium: The atrium was severely dilated. - Right ventricle: The cavity size was mildly to moderately dilated. - Right atrium: The atrium was severely dilated. - Atrial septum: From available images no evidence of ASD or PFO by 2D or color Doppler images. - Tricuspid valve: There was moderate regurgitation. - Pulmonary arteries: Systolic pressure was severely increased. PA peak pressure: 85 mm Hg (S).  01/2014 RHC HEMODYNAMICS: Aortic pressure 123/73 mmHg; LV pressure 122/6 mmHg; LVEDP 15 mm mercury; RA 4 mmHg; RV 31/5; PA 38/13 mmHg; PCWP(mean) unable to wedge the balloon tip catheter; Cardiac Output by Fick 4.73 L per minute  ANGIOGRAPHIC DATA: The left main coronary artery is normal.  The left anterior descending artery is widely patent, traverses the LV apex, gives origin to 2 diagonals, and has no significant obstruction..  The left circumflex artery is widely patent and gives a single large obtuse marginal Calla Wedekind that trifurcates on the lateral wall. No significant obstruction.  The right coronary artery is dominant, gives 3 left ventricular branches and a large bifurcating PDA. No obstructions noted.Marland Kitchen  LEFT VENTRICULOGRAM: Left ventricular angiogram was done in the 30 RAO projection and revealed normal LV cavity size, tachycardia due to atrial fibrillation, and mild global reduction in LV function with an estimated ejection fraction of  40%   IMPRESSIONS: 1. Widely patent coronary arteries 2. Mild pulmonary hypertension 3. Mildly depressed LV systolic function with an estimated EF of 40% 3. This did not  confirm significant pulmonary hypertension. The procedure did suggest that the right atrium and right ventricle were enlarged as we had difficulty manipulating the right heart catheter into the pulmonary artery.   RECOMMENDATION: Back to the outpatient area at home later today if no complications. She should not resume eloquence until 24 hours post procedure..  10/2014 Echo Study Conclusions  - Left ventricle: The cavity size was normal. Wall thickness was increased in a pattern of mild LVH. There was mild concentric hypertrophy. The estimated ejection fraction was 40%. Diffuse hypokinesis. - Mitral valve: Calcified annulus. There was mild regurgitation. - Left atrium: The atrium was severely dilated. - Right atrium: The atrium was severely dilated. - Atrial septum: Thinned and stretched but no obvious PFO. No defect or patent foramen ovale was identified. - Tricuspid valve: There was moderate regurgitation. - Impressions: E/E&' not reported and in afib diastolic evaluation not possible.  Impressions:  - E/E&' not reported and in afib diastolic evaluation not possible.   01/2015 RHC    1. Severely elevated right and left heart filling pressures.  2. Primarily pulmonary venous hypertension (may be additional component of PAH with PVR 4.6 WU).   Plan:  I am going to admit her for IV diuresis.      Right Heart Pressures RHC Procedural Findings: Hemodynamics (mmHg) RA mean 23 RV 68/14 PA 70/36, mean 52 PCWP mean 31  Oxygen saturations: PA 60% AO 98%  Cardiac Output (Fick) 4.54  Cardiac Index (Fick) 2.32  PVR 4.6 WU    11/2017 echo Study Conclusions  - Left ventricle: The cavity size was normal. There was moderate   concentric hypertrophy. Systolic function was  normal. The   estimated ejection fraction was in the range of 60% to 65%. Wall   motion was normal; there were no regional wall motion   abnormalities. Doppler parameters are consistent with abnormal   left ventricular relaxation (grade 1 diastolic dysfunction). - Aortic valve: Transvalvular velocity was within the normal range.   There was no stenosis. There was trivial regurgitation. - Mitral valve: Transvalvular velocity was within the normal range.   There was no evidence for stenosis. There was no regurgitation. - Left atrium: The atrium was severely dilated. - Right ventricle: The cavity size was normal. Wall thickness was   normal. Systolic function was normal. - Tricuspid valve: There was mild regurgitation. - Pulmonary arteries: PA peak pressure: 46 mm Hg (S).  Assessment and Plan  1.Chronic diastolic HF with mixed pulmonary HTN -stable from a symptoms standpoint, continue current meds  - recheck BMET/Mg   2. Afib - no recent symptoms, conitnue current meds   F/u 2 months       Antoine Poche, M.D.

## 2017-12-06 NOTE — Patient Instructions (Addendum)
Medication Instructions:   Your physician recommends that you continue on your current medications as directed. Please refer to the Current Medication list given to you today.  Labwork:  Your physician recommends that you return for lab work in: one week to check your BMET & Mg levels.  Testing/Procedures:  NONE  Follow-Up:  Your physician recommends that you schedule a follow-up appointment in: 2 months.  Any Other Special Instructions Will Be Listed Below (If Applicable).  If you need a refill on your cardiac medications before your next appointment, please call your pharmacy.

## 2017-12-08 ENCOUNTER — Other Ambulatory Visit (INDEPENDENT_AMBULATORY_CARE_PROVIDER_SITE_OTHER): Payer: Medicare Other

## 2017-12-08 ENCOUNTER — Encounter: Payer: Self-pay | Admitting: Internal Medicine

## 2017-12-08 ENCOUNTER — Ambulatory Visit (INDEPENDENT_AMBULATORY_CARE_PROVIDER_SITE_OTHER): Payer: Medicare Other | Admitting: Internal Medicine

## 2017-12-08 VITALS — BP 104/70 | HR 61 | Ht 66.0 in | Wt 234.0 lb

## 2017-12-08 DIAGNOSIS — J9612 Chronic respiratory failure with hypercapnia: Secondary | ICD-10-CM

## 2017-12-08 DIAGNOSIS — R0609 Other forms of dyspnea: Secondary | ICD-10-CM | POA: Diagnosis not present

## 2017-12-08 DIAGNOSIS — J9611 Chronic respiratory failure with hypoxia: Secondary | ICD-10-CM | POA: Diagnosis not present

## 2017-12-08 LAB — CBC WITH DIFFERENTIAL/PLATELET
Basophils Absolute: 0.1 K/uL (ref 0.0–0.1)
Basophils Relative: 1.2 % (ref 0.0–3.0)
Eosinophils Absolute: 0.2 K/uL (ref 0.0–0.7)
Eosinophils Relative: 2.6 % (ref 0.0–5.0)
HCT: 37 % (ref 36.0–46.0)
Hemoglobin: 12.2 g/dL (ref 12.0–15.0)
Lymphocytes Relative: 42.3 % (ref 12.0–46.0)
Lymphs Abs: 3.7 K/uL (ref 0.7–4.0)
MCHC: 33.1 g/dL (ref 30.0–36.0)
MCV: 91.7 fl (ref 78.0–100.0)
Monocytes Absolute: 0.8 K/uL (ref 0.1–1.0)
Monocytes Relative: 8.9 % (ref 3.0–12.0)
Neutro Abs: 3.9 K/uL (ref 1.4–7.7)
Neutrophils Relative %: 45 % (ref 43.0–77.0)
Platelets: 230 K/uL (ref 150.0–400.0)
RBC: 4.03 Mil/uL (ref 3.87–5.11)
RDW: 15 % (ref 11.5–15.5)
WBC: 8.7 K/uL (ref 4.0–10.5)

## 2017-12-08 LAB — SEDIMENTATION RATE: Sed Rate: 32 mm/h — ABNORMAL HIGH (ref 0–30)

## 2017-12-08 NOTE — Patient Instructions (Addendum)
Please remember to go to the lab department downstairs in the basement  for your tests - we will call you with the results when they are available.      Please schedule a follow up visit in 3 months but call sooner if needed with pfts on return  

## 2017-12-08 NOTE — Progress Notes (Signed)
LMTCB

## 2017-12-08 NOTE — Progress Notes (Signed)
Subjective:    Patient ID: [name], female    DOB: [birth year],   MRN: [id number]    Brief patient profile:  80 yowf quit smoking 1984 no symptoms referred by Dr Shirlee Latch to pulmonary clinic 04/21/2015 with abn pfts p starting amiodarone  01/29/2015 for CAF     History of Present Illness  04/21/2015 1st [facility] office visit/ [name]   Chief Complaint  Patient presents with  . Pulmonary Consult    Referred by Dr. Marca Ancona for eval of abnormal PFT's . Pt had PFT done to est baseline before starting amiodarone. She c/o SOB and cough for the past 3 months. She states that she is SOB somtimes just sitting and walking short distances. Her cough is prod with clear sputum, mainly in the am.   able to [employer] pushing cart slow pace s sob  Cough x 2 years    Carries dx of gastroparesis on marinol and gaining wt  Cough is worst first thing in am x years  rec Add pepcid 20 mg at bedtime  GERD diet     07/22/2015  f/u ov/Khamari Sheehan re:  ? Amiodarone tox  Chief Complaint  Patient presents with  . Follow-up    PFT done today. Pt states her breathing has improved. She still c/o occ cough with clear sputum.   cough much better in am since adding pepcid at hs/ limited by knee with activity/ not sob  rec Walk as much as your knee will allow Return for increase cough or difficulty with breathing with exertion  Please schedule a follow up visit in 6  months but call sooner if needed with pfts on return - cancel if you stop amiodarone in the meantime   01/22/2016  f/u ov/[name] re: doe on amiodarone  Chief Complaint  Patient presents with  . Follow-up    PFT done today. Breathing is doing well. No new co's today.   limited more by back than knees  rec Please schedule a follow up visit in 6 months but call sooner if needed with cxr / pfts on return unless you stop the amiodarone in the meantime > did not do     Admit date: 11/25/2017 Discharge date: 11/29/2017   1. Acute on chronic  diastolic CHF: presented with weight gain and LE edema. Diuresed with IV lasix with UOP net -1.4L this admission. Weight 237>229lbs on the day of discharge.  Cr stable - 1.08 on the day of discharge.  - Discharged home on demadex 40mg  in AM and 20mg  in PM.   2. Secondary pulmonary HTN: echo this admission with PA pressure , improved from previous.  - Recommend continued management of sleep apnea  3. Paroxysmal atrial fibrillation: maintained sinus rhythm this admission - Continued amiodarone and eliquis.   4. DM type 2: maintained on ISS inpatient - Continued home medications at discharge        12/08/2017  Extended f/u ov/[name] re: re-esatablish re doe/  still on amiodarone on 02 2lpm / none at rest and 2lpm outside the house ok  Chief Complaint  Patient presents with  . Follow-up    Per Dr Shea Evans. Pt states had PNA in Feb 2019 and then had to be intubated and then ended up needing a trach.  She had trach removed in 09/10/17.  She has been having some swelling in her legs.  She has occ SOB with "alot of activity".    Dyspnea:  Some grocery shopping on 2lpm  Cough: none Sleep: 2lpm hs ok  Leg swelling severity  correlates well with   Sob   No obvious day to day or daytime variability or assoc excess/ purulent sputum or mucus plugs or hemoptysis or cp or chest tightness, subjective wheeze or overt sinus or hb symptoms. No unusual exposure hx or h/o childhood pna/ asthma or knowledge of premature birth.  Sleeping  Ok on 2lpm  without nocturnal  or early am exacerbation  of respiratory  c/o's or need for noct saba. Also denies any obvious fluctuation of symptoms with weather or environmental changes or other aggravating or alleviating factors except as outlined above   Current Allergies, Complete Past Medical History, Past Surgical History, Family History, and Social History were reviewed in [employer] record.  ROS  The following are not active complaints  unless bolded Hoarseness, sore throat, dysphagia, dental problems, itching, sneezing,  nasal congestion or discharge of excess mucus or purulent secretions, ear ache,   fever, chills, sweats, unintended wt loss or wt gain, classically pleuritic or exertional cp,  orthopnea pnd or arm/hand swelling  or leg swelling, presyncope, palpitations, abdominal pain, anorexia, nausea, vomiting, diarrhea  or change in bowel habits or change in bladder habits, change in stools or change in urine, dysuria, hematuria,  rash, arthralgias, visual complaints, headache, numbness, weakness or ataxia or problems with walking or coordination,  change in mood or  memory.        Current Meds  Medication Sig  . acetaminophen (TYLENOL) 500 MG tablet Take 500 mg by mouth every 6 (six) hours as needed for moderate pain or headache.  Marland Kitchen amiodarone (PACERONE) 200 MG tablet Take 1 tablet (200 mg total) by mouth every morning.  Marland Kitchen apixaban (ELIQUIS) 5 MG TABS tablet Take 1 tablet (5 mg total) by mouth 2 (two) times daily.  . Cholecalciferol (VITAMIN D3) 2000 units capsule Take 2,000 Units by mouth daily.  . DULoxetine (CYMBALTA) 30 MG capsule Take 30 mg by mouth 2 (two) times daily.  Marland Kitchen gabapentin (NEURONTIN) 400 MG capsule Take 400-800 mg by mouth 3 (three) times daily. Take 1 capsule(400mg ) by mouth twice a day and take 2 capsules(800mg ) by mouth at bedtime  . glipiZIDE (GLUCOTROL) 10 MG tablet Take 10 mg by mouth 2 (two) times daily.  . insulin aspart (NOVOLOG) 100 UNIT/ML injection Inject 0-20 Units into the skin every 4 (four) hours. CBG 0-120--no units; 121-150--3 units; 151-200--4 units; 201-250--7units; 251-300--11 units; 301-350--15 units; 351-400--20 units  . insulin glargine (LANTUS) 100 UNIT/ML injection Inject 0.55 mLs (55 Units total) into the skin at bedtime.  . levETIRAcetam (KEPPRA) 1000 MG tablet Take 1,000 mg by mouth 2 (two) times daily.  Marland Kitchen levothyroxine (SYNTHROID, LEVOTHROID) 137 MCG tablet Take 137 mcg by mouth  daily.  Marland Kitchen loperamide (IMODIUM) 2 MG capsule TAKE ONE CAPSULE BY MOUTH TWICE DAILY AS NEEDED  . LORazepam (ATIVAN) 1 MG tablet Take 1 mg by mouth 3 (three) times daily as needed. for anxiety  . magnesium oxide (MAG-OX) 400 MG tablet Take 400 mg by mouth daily.   . metFORMIN (GLUCOPHAGE) 500 MG tablet Take 500 mg by mouth 2 (two) times daily with a meal.  . Multiple Vitamin (MULTIVITAMIN WITH MINERALS) TABS tablet Take 1 tablet by mouth daily.  Marland Kitchen nystatin (MYCOSTATIN/NYSTOP) powder Apply topically 2 (two) times daily. To affected area. Keep dry.  . ondansetron (ZOFRAN) 4 MG tablet TAKE ONE TABLET BY MOUTH FOUR TIMES DAILY AS NEEDED FOR NAUSEA  . OXYGEN 2lpm  with sleep and as needed during the day  . pantoprazole (PROTONIX) 40 MG tablet TAKE ONE TABLET BY MOUTH TWICE DAILY BEFORE APPLY MEAL  . potassium chloride (K-DUR,KLOR-CON) 10 MEQ tablet TAKE TWO TABLETS BY MOUTH TWICE DAILY  . torsemide (DEMADEX) 20 MG tablet Take 2 tablets (40mg ) in the morning and 1 tablet (20mg ) in the evening every day.                            Objective:   Physical Exam  amb wf nad    07/22/2015       202  > 01/22/2016    234 > 12/08/2017   234  04/21/15 189 lb 3.2 oz (85.821 kg)  03/20/15 174 lb (78.926 kg)  03/16/15 172 lb (78.019 kg)    Vital signs reviewed - Note on arrival 02 sats  97% on  RA       HEENT: nl dentition, turbinates bilaterally, and oropharynx. Nl external ear canals without cough reflex   NECK :  without JVD/Nodes/TM/ nl carotid upstrokes bilaterally   LUNGS: no acc muscle use,  Nl contour chest which is clear to A and P bilaterally without cough on insp or exp maneuvers   CV:  RRR  no s3 or murmur or increase in P2, and  Min bilateral lower ext edema s pitting   ABD:  soft and nontender with nl inspiratory excursion in the supine position. No bruits or organomegaly appreciated, bowel sounds nl  MS:  Nl gait/ ext warm without deformities, calf tenderness, cyanosis or  clubbing No obvious joint restrictions   SKIN: warm and dry without lesions    NEURO:  alert, approp, nl sensorium with  no motor or cerebellar deficits apparent.       Labs ordered 12/08/2017   Lab Results  Component Value Date   ESRSEDRATE 32 (H) 12/08/2017   ESRSEDRATE 20 04/21/2015    Lab Results  Component Value Date   WBC 8.7 12/08/2017   HGB 12.2 12/08/2017   HCT 37.0 12/08/2017   MCV 91.7 12/08/2017   PLT 230.0 12/08/2017       EOS                                                                0.2                                   12/08/2017      I personally reviewed images and agree with radiology impression as follows:   Chest CT w/o contrast 11/06/24/19 Stable bilateral lung scarring is noted.  No definite acute abnormality seen.    Assessment & Plan:

## 2017-12-13 ENCOUNTER — Encounter: Payer: Self-pay | Admitting: Cardiology

## 2017-12-13 ENCOUNTER — Encounter: Payer: Self-pay | Admitting: Internal Medicine

## 2017-12-13 DIAGNOSIS — J9612 Chronic respiratory failure with hypercapnia: Secondary | ICD-10-CM

## 2017-12-13 DIAGNOSIS — J9611 Chronic respiratory failure with hypoxia: Secondary | ICD-10-CM | POA: Insufficient documentation

## 2017-12-13 NOTE — Assessment & Plan Note (Addendum)
HCO3   11/29/17 =  32   As of 12/08/2017 rec 02 2lpm hs and with activity beyond room to room at home esp in view of tendency to chf/ secondary PH    I had an extended discussion with the patient reviewing all relevant studies completed to date and  lasting 25 minutes of a 40  minute extended post hops transition of care office visit  re  non-specific but potentially very serious refractory respiratory symptoms of uncertain and potentially multiple  etiologies.   Each maintenance medication was reviewed in detail including most importantly the difference between maintenance and prns and under what circumstances the prns are to be triggered using an action plan format that is not reflected in the computer generated alphabetically organized AVS.    Please see AVS for specific instructions unique to this office visit that I personally wrote and verbalized to the the pt in detail and then reviewed with pt  by my nurse highlighting any changes in therapy/plan of care  recommended at today's visit.

## 2017-12-13 NOTE — Progress Notes (Signed)
LMTCB

## 2017-12-13 NOTE — Assessment & Plan Note (Signed)
Placed on amiodarone 01/29/15  04/10/15  FEV1  1.49 (60%) ratio 88 and dlco 53 corrects to 89% at amiodarone 400 04/21/2015  Walked RA x 2 laps @ 185 ft each stopped due to  Dizzy and legs gave out, nl pace, no desat  - PFT's  07/22/2015  FEV1 1.76 (71 % ) ratio 86  p 2 % improvement from saba with  VC 2.02(62%) DLCO  56 % corrects to 93 % for alv volume  - PFT's  01/22/2016  FEV1 1.63 (66 % ) ratio 90  p 16 % improvement from saba p nothing prior to study with VC 1.84(57%)  DLCO  54/53 % corrects to 89 % for alv volume   - 12/08/2017   Walked RA  2 laps @ 185 ft each stopped due to  Sob/ desats transiently (see chronic resp failure)   Response to diuresis/ correlation of sob to vol status reassuring this is not amiodarone lung dz but she is at risk.  Patients typically have been on amiodarone for 6-12 months before this complication manifests.  Of note, serial clinical evaluation for symptoms such as cough dyspnea or fevers is  the preferred method of monitoring for pulmonary toxicity because a decrease in DLCO or lung volumes is a nonspecific for toxicity. Pathologically amiodarone pulmonary toxicity may appear as interstitial pneumonitis, eosinophilic pneumonia, organizing pneumonia, pulmonary fibrosis or less commonly as diffuse alveolar hemorrhage, pulmonary nodules or pleural effusions.  Risk factors for pulmonary toxicity include age greater than 60, daily dose greater than equal to 400 mg, a high cumulative dose, or pre-existing lung disease, so she has 3/4 risk factors and will need to be monitored going forward and risk/ benefits assessed at each ov with cards.   F/u here with pfts planned

## 2017-12-14 ENCOUNTER — Telehealth: Payer: Self-pay | Admitting: Internal Medicine

## 2017-12-14 NOTE — Telephone Encounter (Signed)
Notes recorded by Nyoka Cowden, MD on 12/08/2017 at 4:06 PM EDT Call patient : Studies are unremarkable, no def evidence of amiodarone toxicity but can't rule it out either > will defer to cards and send Dr Wyline Mood a copy  [name], [name] [facility]) is aware of results and voiced her understanding. Labs have been faxed to Dr. Wyline Mood via epic. Nothing further is needed.

## 2017-12-20 ENCOUNTER — Telehealth: Payer: Self-pay | Admitting: Cardiology

## 2017-12-20 MED ORDER — TORSEMIDE 20 MG PO TABS: ORAL_TABLET | ORAL | 3 refills | Status: DC

## 2017-12-20 NOTE — Telephone Encounter (Signed)
Per daughter, patient was instructed by provider to take an extra fluid pill (torsemide) if her weight was up. Please clarify instructions.

## 2017-12-20 NOTE — Telephone Encounter (Signed)
Can rewrite as torsemide 40mg  in AM and 20mg  in PM, may take additional 20mg  as needed for swelling   Dominga Ferry MD

## 2017-12-20 NOTE — Telephone Encounter (Signed)
Patient has been taking extra fluid pill for weight gain per Dr.Branch. Patient's daughter is wandering if she will need to get new RX sent to [facility]. / tg

## 2017-12-22 ENCOUNTER — Ambulatory Visit (HOSPITAL_COMMUNITY)
Admission: RE | Admit: 2017-12-22 | Discharge: 2017-12-22 | Disposition: A | Discharge status: Home / Self Care | Payer: Medicare Other | Source: Ambulatory Visit | Attending: Nurse Practitioner | Admitting: Nurse Practitioner

## 2017-12-22 ENCOUNTER — Other Ambulatory Visit (HOSPITAL_COMMUNITY): Payer: Self-pay | Admitting: Nurse Practitioner

## 2017-12-22 DIAGNOSIS — M47816 Spondylosis without myelopathy or radiculopathy, lumbar region: Secondary | ICD-10-CM | POA: Insufficient documentation

## 2017-12-22 DIAGNOSIS — M545 Low back pain: Secondary | ICD-10-CM | POA: Diagnosis present

## 2017-12-22 DIAGNOSIS — M79606 Pain in leg, unspecified: Secondary | ICD-10-CM | POA: Diagnosis present

## 2017-12-22 DIAGNOSIS — R52 Pain, unspecified: Secondary | ICD-10-CM

## 2017-12-26 ENCOUNTER — Other Ambulatory Visit (HOSPITAL_COMMUNITY): Payer: Self-pay | Admitting: Family Medicine

## 2017-12-26 ENCOUNTER — Telehealth: Payer: Self-pay | Admitting: *Deleted

## 2017-12-26 DIAGNOSIS — S22080A Wedge compression fracture of T11-T12 vertebra, initial encounter for closed fracture: Secondary | ICD-10-CM

## 2017-12-26 MED ORDER — MAGNESIUM OXIDE 400 MG PO TABS: 400.0000 mg | ORAL_TABLET | Freq: Two times a day (BID) | ORAL | 1 refills | Status: DC

## 2017-12-26 NOTE — Telephone Encounter (Signed)
-----   Message from Antoine Poche, MD sent at 12/25/2017  3:32 PM EDT ----- Labs look good other than low magnesium. Verify taking magnesium oxide 400mg  bid and if so change to 400mg  bid   Dominga Ferry MD

## 2017-12-26 NOTE — Telephone Encounter (Signed)
Pt aware and voiced understanding - has meds bubble packed from [facility] - will send in new rx - routed to pcp

## 2017-12-27 LAB — CBC
HEMATOCRIT: 32.9 % — AB (ref 35.0–45.0)
HEMOGLOBIN: 11 g/dL — AB (ref 11.7–15.5)
MCH: 29.7 pg (ref 27.0–33.0)
MCHC: 33.4 g/dL (ref 32.0–36.0)
MCV: 88.9 fL (ref 80.0–100.0)
MPV: 11.9 fL (ref 7.5–12.5)
PLATELETS: 191 10*3/uL (ref 140–400)
RBC: 3.7 10*6/uL — ABNORMAL LOW (ref 3.80–5.10)
RDW: 13.4 % (ref 11.0–15.0)
WBC: 7.7 10*3/uL (ref 3.8–10.8)

## 2017-12-27 LAB — BASIC METABOLIC PANEL
BUN / CREAT RATIO: 18 (calc) (ref 6–22)
BUN: 23 mg/dL (ref 7–25)
CO2: 33 mmol/L — ABNORMAL HIGH (ref 20–32)
CREATININE: 1.28 mg/dL — AB (ref 0.50–0.99)
Calcium: 8.5 mg/dL — ABNORMAL LOW (ref 8.6–10.4)
Chloride: 99 mmol/L (ref 98–110)
GLUCOSE: 191 mg/dL — AB (ref 65–139)
Potassium: 4 mmol/L (ref 3.5–5.3)
SODIUM: 139 mmol/L (ref 135–146)

## 2017-12-27 LAB — MAGNESIUM: Magnesium: 1.4 mg/dL — ABNORMAL LOW (ref 1.5–2.5)

## 2017-12-27 LAB — VITAMIN D 1,25 DIHYDROXY
VITAMIN D3 1, 25 (OH): 49 pg/mL
Vitamin D 1, 25 (OH)2 Total: 49 pg/mL (ref 18–72)

## 2017-12-28 ENCOUNTER — Telehealth (HOSPITAL_COMMUNITY): Payer: Self-pay

## 2017-12-28 ENCOUNTER — Ambulatory Visit (HOSPITAL_COMMUNITY)
Admission: RE | Admit: 2017-12-28 | Discharge: 2017-12-28 | Disposition: A | Discharge status: Home / Self Care | Payer: Medicare Other | Source: Ambulatory Visit | Attending: Family Medicine | Admitting: Family Medicine

## 2017-12-28 DIAGNOSIS — S22080A Wedge compression fracture of T11-T12 vertebra, initial encounter for closed fracture: Secondary | ICD-10-CM

## 2017-12-28 DIAGNOSIS — X58XXXA Exposure to other specified factors, initial encounter: Secondary | ICD-10-CM | POA: Insufficient documentation

## 2018-01-02 ENCOUNTER — Telehealth: Payer: Self-pay | Admitting: *Deleted

## 2018-01-02 NOTE — Telephone Encounter (Signed)
Pt aware and says Dr Osborne Casco office called her yesterday and increased mag to 400 mg bid - update pt medication list

## 2018-01-02 NOTE — Telephone Encounter (Signed)
-----   Message from Antoine Poche, MD sent at 01/02/2018  2:18 PM EDT ----- Magnesium is low, increase mag oxide to 400mg  bid   Dominga Ferry MD

## 2018-01-08 ENCOUNTER — Other Ambulatory Visit (HOSPITAL_COMMUNITY): Payer: Self-pay | Admitting: Interventional Radiology

## 2018-01-08 DIAGNOSIS — S22080A Wedge compression fracture of T11-T12 vertebra, initial encounter for closed fracture: Secondary | ICD-10-CM

## 2018-01-10 ENCOUNTER — Other Ambulatory Visit (HOSPITAL_COMMUNITY): Payer: Self-pay | Admitting: Family Medicine

## 2018-01-10 ENCOUNTER — Ambulatory Visit (HOSPITAL_COMMUNITY)
Admission: RE | Admit: 2018-01-10 | Discharge: 2018-01-10 | Disposition: A | Discharge status: Home / Self Care | Payer: Medicare Other | Source: Ambulatory Visit | Attending: Family Medicine | Admitting: Family Medicine

## 2018-01-10 ENCOUNTER — Other Ambulatory Visit: Payer: Self-pay | Admitting: Gastroenterology

## 2018-01-10 DIAGNOSIS — R52 Pain, unspecified: Secondary | ICD-10-CM

## 2018-01-10 DIAGNOSIS — M79671 Pain in right foot: Secondary | ICD-10-CM | POA: Diagnosis present

## 2018-01-19 ENCOUNTER — Ambulatory Visit: Payer: Medicare Other | Admitting: Gastroenterology

## 2018-01-24 ENCOUNTER — Telehealth: Payer: Self-pay | Admitting: Cardiology

## 2018-01-24 ENCOUNTER — Ambulatory Visit (HOSPITAL_COMMUNITY)
Admission: RE | Admit: 2018-01-24 | Discharge: 2018-01-24 | Disposition: A | Discharge status: Home / Self Care | Payer: Medicare Other | Source: Ambulatory Visit | Attending: Interventional Radiology | Admitting: Interventional Radiology

## 2018-01-24 ENCOUNTER — Telehealth: Payer: Self-pay | Admitting: Student

## 2018-01-24 DIAGNOSIS — S22080A Wedge compression fracture of T11-T12 vertebra, initial encounter for closed fracture: Secondary | ICD-10-CM

## 2018-01-24 NOTE — Telephone Encounter (Signed)
Needs the okay to stop Eliquis 48 hrs prior to Kyphoplasty and the ok to bridge with ASA 325 QD. Please call back with instructions. / tg

## 2018-01-24 NOTE — Telephone Encounter (Signed)
Called patient's cardiologist, Dr. Wyline Mood, regarding Eliquis hold prior to procedure. Patient is scheduled for an image-guided thoracic 12 kyphoplasty/vertebroplasty with Dr. Corliss Skains.  Spoke with Terri from office. Requested patient hold Eliquis 48 hours prior to procedure and bridge with Aspirin 325 mg once daily. Camelia Eng states she will send message to Dr. Wyline Mood and get back to [place].   All questions answered and concerns addressed.  Tara Boga Louk, PA-C 01/24/2018, 3:54 PM

## 2018-01-24 NOTE — Consult Note (Signed)
Chief Complaint: Patient was seen in consultation today for thoracic 12 compression fracture.  Referring Physician(s): [name]  Supervising Physician: Julieanne Cotton  Patient Status: [name] - Out-pt  History of Present Illness: [name] is a 67 y.o. female with a past medical history of hypertension, paroxysmal atrial fibrillation on chronic anticoagulation, systolic and diastolic HF, hyperlipidemia, syncope, seizures, pulmonary hypertension, hypothyroidism, diabetes mellitus type II, Barrett's esophagus, GERD, hiatal hernia, gastroparesis, IBS, anemia, OSA on CPAP, obesity, arthritis, chronic low back pain, and anxiety. In 09/2017, she slipped getting into bed and felt immediate back pain. She eventually went to Dr. [name] for management.  MRI thoracic spine 12/28/2017: 1. T12 subacute compression fracture. 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 No retropulsion. 2. Motion degraded.  DG lumbar spine 12/22/2017: 1. Interval and possibly acute anterior compression fracture of T12. 2. Degenerative changes in the lumbar spine.  IR requested by Dr. [name] for management of thoracic 12 compression fracture. Patient awake and alert sitting in chair. Accompanied by daughter. Complains of low back pain, rated anywhere from 5-9/10 depending on activity level. States she now ambulates with walker due to pain. States she has taken Tylenol, Aleve, and Tramadol for pain. Complains of numbness/tingling and pain down bilateral legs, right>left.  Denies fever, chills, bladder/bowel incontinence, or urinary symptoms including dysuria and frequency.  Patient is currently taking Eliquis 5 mg twice daily.   Past Medical History:  Diagnosis Date  . Abnormal pulmonary function test   . Anemia    H/H of 10/30 with a normal MCV in 12/09  . Anxiety   . Arthritis   . Barrett's esophagus    Diagnosed 1995. Last EGD 2016-NO  BARRETT'S.   . Chest pain    Negative cardiac catheterization in 2002; negative stress nuclear study in 2008  . Chronic anticoagulation   . Chronic combined systolic and diastolic CHF (congestive heart failure) (HCC)    a. EF predominantly normal during prior echoes but was 40% during 10/2014 echo. b. Most recent 01/2015 EF normal, 55-60%.  . Chronic LBP    Surgical intervention in 1996  . Diabetes mellitus, type 2 (HCC)    Insulin therapy; exacerbated by prednisone  . Dysrhythmia    AFib  . Gastroparesis    99% retention 05/2008 on GES  . GERD (gastroesophageal reflux disease)   . Hiatal hernia   . Hyperlipidemia   . Hypertension   . Hypothyroid   . IBS (irritable bowel syndrome)   . Obesity   . OSA on CPAP    had CPAP and cannot tolerate.  . Paroxysmal atrial fibrillation (HCC)   . Pulmonary hypertension (HCC) 01/2015   a. Predominantly pulmonary venous hypertension but may be component of PAH.  . Seizures (HCC)    last seizure was 2 years ago; on keppra for this; unknown etiology  . Syncope    a. Admitted 05/2009; magnetic resonance imagin/ MRA - negative; etiology thought to be orthostasis secondary to drugs and dehydration. b. Syncope 02/2015 also felt 2/2 dehydration.    Past Surgical History:  Procedure Laterality Date  . BACK SURGERY  1996  . BIOPSY N/A 11/08/2013   Procedure: BIOPSY  / Tissue sampling / ulcers present in small intestine;  Surgeon: West Bali, MD;  Location: [facility];  Service: Endoscopy;  Laterality: N/A;  . CARDIAC CATHETERIZATION  2002  . CARDIAC CATHETERIZATION N/A 01/26/2015   Procedure: Right Heart Cath;  Surgeon: Eliot Ford  Shirlee Latch, MD;  Location: [name] INVASIVE CV LAB;  Service: Cardiovascular;  Laterality: N/A;  . CARDIOVASCULAR STRESS TEST  2008   Stress nuclear study  . CARDIOVERSION N/A 03/06/2015   Procedure: CARDIOVERSION;  Surgeon: Laurey Morale, MD;  Location: [facility] ENDOSCOPY;  Service: Cardiovascular;  Laterality: N/A;  . CARPAL TUNNEL  RELEASE  1994  . COLONOSCOPY  11/2011   Dr. Darrick Penna: Internal hemorrhoids, mild diverticulosis. Random colon biopsies negative.  . COLONOSCOPY N/A 11/08/2013   SLF: Normal mucosa in the terminal ileum/The colon IS redundant/  Moderate diverticulosis throughout the entire colon. ileum bx benign. colon bx benign  . ESOPHAGOGASTRODUODENOSCOPY  2008   Barrett's without dysplasia. esphagus dilated. antral erosions, h.pylori serologies negative.  . ESOPHAGOGASTRODUODENOSCOPY  11/2011   Dr. Darrick Penna: Barrett's esophagus, mild gastritis, diverticulum in the second portion of the duodenum repeat EGD 3 years. Small bowel biopsies negative. Gastric biopsy show reactive gastropathy but no H. pylori. Esophageal biopsies consistent with GERD. Next EGD 11/2014  . ESOPHAGOGASTRODUODENOSCOPY N/A 11/21/2014   ZOX:WRUE non-erosive gastritis/irregular z-line  . GIVENS CAPSULE STUDY  12/07/2011   Proximal small bowel, rare AVM. Distal small bowel, multiple ulcers noted  . GIVENS CAPSULE STUDY N/A 09/27/2013   Distal small bowel ulcers extending to TI.  Marland Kitchen GIVENS CAPSULE STUDY N/A 10/10/2013   Procedure: GIVENS CAPSULE STUDY;  Surgeon: West Bali, MD;  Location: [facility];  Service: Endoscopy;  Laterality: N/A;  7:30  . KNEE ARTHROSCOPY WITH MEDIAL MENISECTOMY Right 06/09/2016   Procedure: KNEE ARTHROSCOPY WITH MEDIAL MENISECTOMY;  Surgeon: Vickki Hearing, MD;  Location: [facility] ORS;  Service: Orthopedics;  Laterality: Right;  medial and lateral menisectomy  . LAMINECTOMY  1995   L4-L5  . LAPAROSCOPIC CHOLECYSTECTOMY  1990s  . LEFT HEART CATHETERIZATION WITH CORONARY ANGIOGRAM  01/10/2014   Procedure: LEFT HEART CATHETERIZATION WITH CORONARY ANGIOGRAM;  Surgeon: Lesleigh Noe, MD;  Location: [facility] CATH LAB;  Service: Cardiovascular;;  . RIGHT HEART CATHETERIZATION N/A 01/10/2014   Procedure: RIGHT HEART CATH;  Surgeon: Lesleigh Noe, MD;  Location: [name] CATH LAB;  Service: Cardiovascular;  Laterality: N/A;  . TOTAL  ABDOMINAL HYSTERECTOMY  1999  . TRACHEOSTOMY TUBE PLACEMENT N/A 09/01/2017   Procedure: TRACHEOSTOMY;  Surgeon: Drema Halon, MD;  [facility];  Service: ENT;  Laterality: N/A;    Allergies: Celexa [citalopram hydrobromide]; Codeine; Dilaudid [hydromorphone hcl]; Reglan [metoclopramide]; and Ibuprofen  Medications: Prior to Admission medications   Medication Sig Start Date End Date Taking? Authorizing Provider  acetaminophen (TYLENOL) 500 MG tablet Take 500 mg by mouth every 6 (six) hours as needed for moderate pain or headache.    [provider]  amiodarone (PACERONE) 200 MG tablet Take 1 tablet (200 mg total) by mouth every morning. 03/24/17   Branch, Dorothe Pea, MD  apixaban (ELIQUIS) 5 MG TABS tablet Take 1 tablet (5 mg total) by mouth 2 (two) times daily. 06/09/16   Vickki Hearing, MD  Cholecalciferol (VITAMIN D3) 2000 units capsule Take 2,000 Units by mouth daily. 10/25/17   [provider]  DULoxetine (CYMBALTA) 30 MG capsule Take 30 mg by mouth 2 (two) times daily. 11/24/17   [provider]  gabapentin (NEURONTIN) 400 MG capsule Take 400-800 mg by mouth 3 (three) times daily. Take 1 capsule(400mg ) by mouth twice a day and take 2 capsules(800mg ) by mouth at bedtime 07/18/17   [provider]  glipiZIDE (GLUCOTROL) 10 MG tablet Take 10 mg by mouth 2 (two) times  daily. 11/24/17   [provider]  insulin aspart (NOVOLOG) 100 UNIT/ML injection Inject 0-20 Units into the skin every 4 (four) hours. CBG 0-120--no units; 121-150--3 units; 151-200--4 units; 201-250--7units; 251-300--11 units; 301-350--15 units; 351-400--20 units 08/22/17   Tat, David, MD  insulin glargine (LANTUS) 100 UNIT/ML injection Inject 0.55 mLs (55 Units total) into the skin at bedtime. 08/22/17   Catarina Hartshorn, MD  levETIRAcetam (KEPPRA) 1000 MG tablet Take 1,000 mg by mouth 2 (two) times daily. 07/18/17   [provider]  levothyroxine (SYNTHROID, LEVOTHROID)  137 MCG tablet Take 137 mcg by mouth daily. 10/14/14   [provider]  loperamide (IMODIUM) 2 MG capsule TAKE ONE CAPSULE BY MOUTH TWICE DAILY AS NEEDED 10/23/17   Gelene Mink, NP  LORazepam (ATIVAN) 1 MG tablet Take 1 mg by mouth 3 (three) times daily as needed. for anxiety 11/24/17   [provider]  magnesium oxide (MAG-OX) 400 MG tablet Take 1 tablet (400 mg total) by mouth 2 (two) times daily. 12/26/17   Antoine Poche, MD  metFORMIN (GLUCOPHAGE) 500 MG tablet Take 500 mg by mouth 2 (two) times daily with a meal.    [provider]  Multiple Vitamin (MULTIVITAMIN WITH MINERALS) TABS tablet Take 1 tablet by mouth daily.    [provider]  nystatin (MYCOSTATIN/NYSTOP) powder Apply topically 2 (two) times daily. To affected area. Keep dry. 11/24/17   Dunn, Tacey Ruiz, PA-C  ondansetron (ZOFRAN) 4 MG tablet TAKE 1 TABLET BY MOUTH FOUR TIMES DAILY AS NEEDED FOR NAUSEA 01/10/18   Gelene Mink, NP  OXYGEN 2lpm with sleep and as needed during the day    [provider]  pantoprazole (PROTONIX) 40 MG tablet TAKE ONE TABLET BY MOUTH TWICE DAILY BEFORE APPLY MEAL 02/07/17   Gelene Mink, NP  potassium chloride (K-DUR,KLOR-CON) 10 MEQ tablet TAKE TWO TABLETS BY MOUTH TWICE DAILY 04/20/17   Antoine Poche, MD  torsemide (DEMADEX) 20 MG tablet Take 2 tablets (40mg ) in the morning and 1 tablet (20mg ) in the evening every day. May take and additonal 20 mg as needed for swelling. 12/20/17   Antoine Poche, MD     Family History  Problem Relation Age of Onset  . Hypertension Mother   . Alzheimer's disease Mother   . Stroke Mother   . Heart attack Mother   . Hypertension Other   . Breast cancer Sister   . Heart disease Neg Hx   . Colon cancer Neg Hx     Social History   Socioeconomic History  . Marital status: Married    Spouse name: Not on file  . Number of children: Not on file  . Years of education: Not on file  . Highest education level: Not  on file  Occupational History  . Occupation: Passenger transport manager at [employer]    Employer: [employer]    Comment: disabled    Employer: RETIRED  Social Needs  . Financial resource strain: Not on file  . Food insecurity:    Worry: Not on file    Inability: Not on file  . Transportation needs:    Medical: Not on file    Non-medical: Not on file  Tobacco Use  . Smoking status: Former Smoker    Packs/day: 0.25    Years: 15.00    Pack years: 3.75    Types: Cigarettes    Start date: 02/26/1966    Last attempt to quit: 07/01/1983    Years since  quitting: 34.5  . Smokeless tobacco: Never Used  . Tobacco comment: quit in 1984  Substance and Sexual Activity  . Alcohol use: No    Alcohol/week: 0.0 oz  . Drug use: No  . Sexual activity: Yes    Birth control/protection: None, Surgical  Lifestyle  . Physical activity:    Days per week: Not on file    Minutes per session: Not on file  . Stress: Not on file  Relationships  . Social connections:    Talks on phone: Not on file    Gets together: Not on file    Attends religious service: Not on file    Active member of club or organization: Not on file    Attends meetings of clubs or organizations: Not on file    Relationship status: Not on file  Other Topics Concern  . Not on file  Social History Narrative   Sedentary   4 children, "blended family"     Review of Systems: A 12 point ROS discussed and pertinent positives are indicated in the HPI above.  All other systems are negative.  Review of Systems  Constitutional: Negative for chills and fever.  Respiratory: Negative for shortness of breath and wheezing.   Cardiovascular: Negative for chest pain and palpitations.  Gastrointestinal:       Negative for bowel incontinence.  Genitourinary: Negative for dysuria and frequency.       Negative for bladder incontinence.  Musculoskeletal: Positive for back pain.  Neurological: Positive for numbness.    Vital Signs: There were no vitals  taken for this visit.  Physical Exam  Constitutional: She is oriented to person, place, and time. She appears well-developed and well-nourished. No distress.  Pulmonary/Chest: Effort normal. No respiratory distress.  Musculoskeletal:  Moderate tenderness of midline back at approximate level of thoracic 12.  Neurological: She is alert and oriented to person, place, and time.  Skin: Skin is warm and dry.  Psychiatric: She has a normal mood and affect. Her behavior is normal. Judgment and thought content normal.  Nursing note and vitals reviewed.    Imaging: Mr Thoracic Spine Wo Contrast  Result Date: 12/28/2017 CLINICAL DATA:  Upper back pain for 2 years. EXAM: MRI THORACIC SPINE WITHOUT CONTRAST TECHNIQUE: Multiplanar, multisequence MR imaging of the thoracic spine was performed. No intravenous contrast was administered. COMPARISON:  11/26/2016 chest CT FINDINGS: Alignment:  Mild exaggerated thoracic kyphosis.  No listhesis. Vertebrae: T12 (numbered from above) body marrow edema following a horizontal fracture cleft containing fluid and gas. Height loss is mild (~30%). The fracture is nonacute based on chest CT 1 month ago but not healed. No evidence of underlying bone lesion. No retropulsion. Remote T1, T2, T3, and T4 superior endplate fractures with minor depression. Small scattered Schmorl's nodes. Cord:  Normal signal and morphology, as permitted by motion artifact Paraspinal and other soft tissues: Negative Disc levels: Generalized spondylosis and disc narrowing with minor bulging. No cord impingement. The foramina are diffusely patent IMPRESSION: 1. T12 subacute compression fracture. 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 No retropulsion. 2. Motion degraded. Electronically Signed   By: Marnee Spring M.D.   On: 12/28/2017 12:11   Dg Foot Complete Right  Result Date: 01/11/2018 CLINICAL DATA:  Right foot pain after fall several months  ago. EXAM: RIGHT FOOT COMPLETE - 3+ VIEW COMPARISON:  None. FINDINGS: There is no evidence of fracture or dislocation. Joint spaces are unremarkable. Moderate posterior calcaneal spurring is noted.  Soft tissues are unremarkable. IMPRESSION: No significant abnormality seen in the right foot. Electronically Signed   By: Lupita Raider, M.D.   On: 01/11/2018 09:07    Labs:  CBC: Recent Labs    11/24/17 1555 11/25/17 2014 12/08/17 1516 12/25/17 0750  WBC 7.0 8.8 8.7 7.7  HGB 11.0* 11.4* 12.2 11.0*  HCT 35.0* 36.2 37.0 32.9*  PLT 213 208 230.0 191    COAGS: Recent Labs    08/23/17 0737  INR 1.57    BMP: Recent Labs    11/25/17 2014 11/27/17 0406 11/28/17 0646 11/29/17 0715 12/25/17 0750  NA 136 142 142 141 139  K 4.5 3.7 3.9 3.7 4.0  CL 98* 104 101 99* 99  CO2 27 30 32 32 33*  GLUCOSE 168* 234* 233* 223* 191*  BUN 24* 17 13 14 23   CALCIUM 9.0 9.0 9.1 9.1 8.5*  CREATININE 1.44* 1.13* 1.17* 1.08* 1.28*  GFRNONAA 36* 49* 47* 52*  --   GFRAA 42* 57* 54* 60*  --     LIVER FUNCTION TESTS: Recent Labs    08/12/17 0425 08/13/17 0437 08/23/17 0737  08/26/17 0646 08/27/17 0259 08/28/17 0345 11/24/17 1555  BILITOT 0.4 0.7 0.8  --   --   --   --  0.7  AST 25 21 90*  --   --   --   --  19  ALT 19 19 65*  --   --   --   --  16  ALKPHOS 44 40 52  --   --   --   --  64  PROT 5.8* 6.1* 6.1*  --   --   --   --  6.6  ALBUMIN 2.3* 2.4* 2.4*   < > 2.0* 2.0* 1.9* 3.4*   < > = values in this interval not displayed.    TUMOR MARKERS: No results for input(s): AFPTM, CEA, CA199, CHROMGRNA in the last 8760 hours.  Assessment and Plan:  Thoracic 12 compression fracture. Reviewed imaging with patient and daughter. Explained that the best course of management for her thoracic 12 compression fracture at this time is with a procedure called a kyphoplasty/vertebroplasty. Explained procedure including risks and benefits. Explained the goal of this procedure is to decrease his pain  level.  Instructed patient that in order for this procedure to occur, she will have to be off Eliquis a minimum of 48 hours prior to procedure, per IR protocol. Will call Dr. Wyline Mood for cardiac clearance.  Plan for follow-up with image-guided thoracic 12 kyphoplasty/vertebroplasty. Informed patient that our schedulers will call her to set up this procedure.  All questions answered and concerns addressed. Patient and daughter convey understanding and agree with plan.  Thank you for this interesting consult.  I greatly enjoyed meeting KIMBERLEY SPEECE and look forward to participating in their care.  A copy of this report was sent to the requesting provider on this date.  Electronically Signed: Elwin Mocha, PA-C 01/24/2018, 8:08 AM   I spent a total of 30 Minutes in face to face in clinical consultation, greater than 50% of which was counseling/coordinating care for thoracic 12 compression fracture.

## 2018-01-25 NOTE — Telephone Encounter (Signed)
Allie w/ Interventional Radiology [phone] was out of the office and left this number to leave on VM with instructions [phone] - LM with instructions - also LM for pt to return call

## 2018-01-25 NOTE — Telephone Encounter (Signed)
Daughter [name] [facility]) voiced understanding and would relay to pt

## 2018-01-25 NOTE — Telephone Encounter (Signed)
Ok to hold eliquis, I would actually hold 72 hrs prior to procedure, restart day after. I don't see an indication to use aspirin as a bridge.   Dominga Ferry MD

## 2018-01-27 ENCOUNTER — Ambulatory Visit (HOSPITAL_COMMUNITY)
Admission: RE | Admit: 2018-01-27 | Discharge: 2018-01-27 | Disposition: A | Discharge status: Home / Self Care | Payer: Medicare Other | Source: Ambulatory Visit | Attending: Internal Medicine | Admitting: Internal Medicine

## 2018-01-27 ENCOUNTER — Other Ambulatory Visit (HOSPITAL_COMMUNITY): Payer: Self-pay | Admitting: Internal Medicine

## 2018-01-27 DIAGNOSIS — R05 Cough: Secondary | ICD-10-CM | POA: Diagnosis not present

## 2018-01-27 DIAGNOSIS — R059 Cough, unspecified: Secondary | ICD-10-CM

## 2018-01-27 DIAGNOSIS — R918 Other nonspecific abnormal finding of lung field: Secondary | ICD-10-CM | POA: Insufficient documentation

## 2018-01-27 DIAGNOSIS — I517 Cardiomegaly: Secondary | ICD-10-CM | POA: Diagnosis not present

## 2018-01-30 ENCOUNTER — Other Ambulatory Visit (HOSPITAL_COMMUNITY): Payer: Self-pay | Admitting: Interventional Radiology

## 2018-01-30 DIAGNOSIS — S22080A Wedge compression fracture of T11-T12 vertebra, initial encounter for closed fracture: Secondary | ICD-10-CM

## 2018-02-07 ENCOUNTER — Other Ambulatory Visit: Payer: Self-pay | Admitting: Radiology

## 2018-02-08 ENCOUNTER — Other Ambulatory Visit: Payer: Self-pay | Admitting: Gastroenterology

## 2018-02-09 ENCOUNTER — Ambulatory Visit (HOSPITAL_COMMUNITY)
Admission: RE | Admit: 2018-02-09 | Discharge: 2018-02-09 | Disposition: A | Discharge status: Home / Self Care | Payer: Medicare Other | Source: Ambulatory Visit | Attending: Interventional Radiology | Admitting: Interventional Radiology

## 2018-02-09 ENCOUNTER — Encounter (HOSPITAL_COMMUNITY): Payer: Self-pay

## 2018-02-09 DIAGNOSIS — I5042 Chronic combined systolic (congestive) and diastolic (congestive) heart failure: Secondary | ICD-10-CM | POA: Insufficient documentation

## 2018-02-09 DIAGNOSIS — Z7901 Long term (current) use of anticoagulants: Secondary | ICD-10-CM | POA: Insufficient documentation

## 2018-02-09 DIAGNOSIS — M1990 Unspecified osteoarthritis, unspecified site: Secondary | ICD-10-CM | POA: Insufficient documentation

## 2018-02-09 DIAGNOSIS — G4733 Obstructive sleep apnea (adult) (pediatric): Secondary | ICD-10-CM | POA: Insufficient documentation

## 2018-02-09 DIAGNOSIS — E039 Hypothyroidism, unspecified: Secondary | ICD-10-CM | POA: Insufficient documentation

## 2018-02-09 DIAGNOSIS — I480 Paroxysmal atrial fibrillation: Secondary | ICD-10-CM | POA: Diagnosis not present

## 2018-02-09 DIAGNOSIS — I272 Pulmonary hypertension, unspecified: Secondary | ICD-10-CM | POA: Insufficient documentation

## 2018-02-09 DIAGNOSIS — G8929 Other chronic pain: Secondary | ICD-10-CM | POA: Insufficient documentation

## 2018-02-09 DIAGNOSIS — M4854XA Collapsed vertebra, not elsewhere classified, thoracic region, initial encounter for fracture: Secondary | ICD-10-CM | POA: Insufficient documentation

## 2018-02-09 DIAGNOSIS — K3184 Gastroparesis: Secondary | ICD-10-CM | POA: Insufficient documentation

## 2018-02-09 DIAGNOSIS — E669 Obesity, unspecified: Secondary | ICD-10-CM | POA: Diagnosis not present

## 2018-02-09 DIAGNOSIS — I110 Hypertensive heart disease with heart failure: Secondary | ICD-10-CM | POA: Diagnosis not present

## 2018-02-09 DIAGNOSIS — Z6836 Body mass index (BMI) 36.0-36.9, adult: Secondary | ICD-10-CM | POA: Diagnosis not present

## 2018-02-09 DIAGNOSIS — F419 Anxiety disorder, unspecified: Secondary | ICD-10-CM | POA: Diagnosis not present

## 2018-02-09 DIAGNOSIS — M8580 Other specified disorders of bone density and structure, unspecified site: Secondary | ICD-10-CM | POA: Diagnosis not present

## 2018-02-09 DIAGNOSIS — E1143 Type 2 diabetes mellitus with diabetic autonomic (poly)neuropathy: Secondary | ICD-10-CM | POA: Insufficient documentation

## 2018-02-09 DIAGNOSIS — Z87891 Personal history of nicotine dependence: Secondary | ICD-10-CM | POA: Insufficient documentation

## 2018-02-09 DIAGNOSIS — R569 Unspecified convulsions: Secondary | ICD-10-CM | POA: Insufficient documentation

## 2018-02-09 DIAGNOSIS — K589 Irritable bowel syndrome without diarrhea: Secondary | ICD-10-CM | POA: Insufficient documentation

## 2018-02-09 DIAGNOSIS — S22080A Wedge compression fracture of T11-T12 vertebra, initial encounter for closed fracture: Secondary | ICD-10-CM

## 2018-02-09 DIAGNOSIS — Z885 Allergy status to narcotic agent status: Secondary | ICD-10-CM | POA: Diagnosis not present

## 2018-02-09 DIAGNOSIS — E785 Hyperlipidemia, unspecified: Secondary | ICD-10-CM | POA: Diagnosis not present

## 2018-02-09 DIAGNOSIS — K219 Gastro-esophageal reflux disease without esophagitis: Secondary | ICD-10-CM | POA: Diagnosis not present

## 2018-02-09 DIAGNOSIS — Z794 Long term (current) use of insulin: Secondary | ICD-10-CM | POA: Diagnosis not present

## 2018-02-09 HISTORY — PX: IR KYPHO THORACIC WITH BONE BIOPSY: IMG5518

## 2018-02-09 LAB — CBC
HEMATOCRIT: 38.5 % (ref 36.0–46.0)
Hemoglobin: 11.7 g/dL — ABNORMAL LOW (ref 12.0–15.0)
MCH: 29 pg (ref 26.0–34.0)
MCHC: 30.4 g/dL (ref 30.0–36.0)
MCV: 95.3 fL (ref 78.0–100.0)
PLATELETS: 249 10*3/uL (ref 150–400)
RBC: 4.04 MIL/uL (ref 3.87–5.11)
RDW: 16.2 % — ABNORMAL HIGH (ref 11.5–15.5)
WBC: 9.3 10*3/uL (ref 4.0–10.5)

## 2018-02-09 LAB — BASIC METABOLIC PANEL
Anion gap: 10 (ref 5–15)
BUN: 20 mg/dL (ref 8–23)
CHLORIDE: 99 mmol/L (ref 98–111)
CO2: 33 mmol/L — AB (ref 22–32)
CREATININE: 1.49 mg/dL — AB (ref 0.44–1.00)
Calcium: 9.3 mg/dL (ref 8.9–10.3)
GFR calc non Af Amer: 35 mL/min — ABNORMAL LOW (ref >60)
GFR, EST AFRICAN AMERICAN: 40 mL/min — AB (ref >60)
Glucose, Bld: 171 mg/dL — ABNORMAL HIGH (ref 70–99)
POTASSIUM: 4.2 mmol/L (ref 3.5–5.1)
Sodium: 142 mmol/L (ref 135–145)

## 2018-02-09 LAB — PROTIME-INR
INR: 0.98
Prothrombin Time: 12.9 seconds (ref 11.4–15.2)

## 2018-02-09 MED ORDER — CEFAZOLIN SODIUM-DEXTROSE 2-4 GM/100ML-% IV SOLN
2.0000 g | INTRAVENOUS | Status: AC
  Administered 2018-02-09: 2 g via INTRAVENOUS

## 2018-02-09 MED ORDER — CEFAZOLIN SODIUM-DEXTROSE 2-4 GM/100ML-% IV SOLN
INTRAVENOUS | Status: AC
  Filled 2018-02-09: qty 100

## 2018-02-09 MED ORDER — BUPIVACAINE HCL (PF) 0.5 % IJ SOLN
INTRAMUSCULAR | Status: AC | PRN
  Administered 2018-02-09: 20 mL

## 2018-02-09 MED ORDER — GELATIN ABSORBABLE 12-7 MM EX MISC
CUTANEOUS | Status: AC
  Filled 2018-02-09: qty 1

## 2018-02-09 MED ORDER — IOPAMIDOL (ISOVUE-300) INJECTION 61%
INTRAVENOUS | Status: AC
  Administered 2018-02-09: 10 mL
  Filled 2018-02-09: qty 50

## 2018-02-09 MED ORDER — SODIUM CHLORIDE 0.9 % IV SOLN: INTRAVENOUS | Status: DC

## 2018-02-09 MED ORDER — MIDAZOLAM HCL 2 MG/2ML IJ SOLN
INTRAMUSCULAR | Status: AC
  Filled 2018-02-09: qty 4

## 2018-02-09 MED ORDER — TOBRAMYCIN SULFATE 1.2 G IJ SOLR
INTRAMUSCULAR | Status: AC
  Administered 2018-02-09: 09:00:00
  Filled 2018-02-09: qty 1.2

## 2018-02-09 MED ORDER — SODIUM CHLORIDE 0.9 % IV SOLN: INTRAVENOUS | Status: AC

## 2018-02-09 MED ORDER — BUPIVACAINE HCL (PF) 0.5 % IJ SOLN
INTRAMUSCULAR | Status: AC
  Filled 2018-02-09: qty 30

## 2018-02-09 MED ORDER — FENTANYL CITRATE (PF) 100 MCG/2ML IJ SOLN
INTRAMUSCULAR | Status: AC
  Filled 2018-02-09: qty 4

## 2018-02-09 MED ORDER — FENTANYL CITRATE (PF) 100 MCG/2ML IJ SOLN
INTRAMUSCULAR | Status: AC | PRN
  Administered 2018-02-09: 25 ug via INTRAVENOUS

## 2018-02-09 MED ORDER — MIDAZOLAM HCL 2 MG/2ML IJ SOLN
INTRAMUSCULAR | Status: AC | PRN
  Administered 2018-02-09: 1 mg via INTRAVENOUS

## 2018-02-09 NOTE — H&P (Signed)
Chief Complaint: Back pain  Referring Physician(s): [name]  Supervising Physician: Julieanne Cotton  Patient Status: [facility] - Out-pt  History of Present Illness: [name] is a 67 y.o. [name] with a past medical history of hypertension, paroxysmal atrial fibrillation on Eliquis, systolic and diastolic HF, hyperlipidemia, syncope, seizures, pulmonary hypertension, hypothyroidism, diabetes mellitus type II, Barrett's esophagus, GERD, hiatal hernia, gastroparesis, IBS, anemia, OSA on CPAP, obesity, arthritis, chronic low back pain, and anxiety.   Around March she slipped getting into bed and felt immediate back pain.   MRI thoracic spine done 12/28/2017: 1. T12 subacute compression fracture. 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 No retropulsion. 2. Motion degraded.  She rates her pain from 5-9/10 depending on activity level.   She ambulates with walker due to pain.   She is taking Tylenol, Aleve, and Tramadol for pain.  She also reports numbness/tingling and pain down bilateral legs, right>left.   Today she denies fever, chills, bladder/bowel incontinence, or urinary symptoms including dysuria and frequency.  She has held her Eliquis x 72 hours.  She is NPO.  Past Medical History:  Diagnosis Date  . Abnormal pulmonary function test   . Anemia    H/H of 10/30 with a normal MCV in 12/09  . Anxiety   . Arthritis   . Barrett's esophagus    Diagnosed 1995. Last EGD 2016-NO BARRETT'S.   . Chest pain    Negative cardiac catheterization in 2002; negative stress nuclear study in 2008  . Chronic anticoagulation   . Chronic combined systolic and diastolic CHF (congestive heart failure) (HCC)    a. EF predominantly normal during prior echoes but was 40% during 10/2014 echo. b. Most recent 01/2015 EF normal, 55-60%.  . Chronic LBP    Surgical intervention in 1996  . Diabetes mellitus, type 2 (HCC)    Insulin therapy; exacerbated by prednisone  . Dysrhythmia    AFib  . Gastroparesis    99% retention 05/2008 on GES  . GERD (gastroesophageal reflux disease)   . Hiatal hernia   . Hyperlipidemia   . Hypertension   . Hypothyroid   . IBS (irritable bowel syndrome)   . Obesity   . OSA on CPAP    had CPAP and cannot tolerate.  . Paroxysmal atrial fibrillation (HCC)   . Pulmonary hypertension (HCC) 01/2015   a. Predominantly pulmonary venous hypertension but may be component of PAH.  . Seizures (HCC)    last seizure was 2 years ago; on keppra for this; unknown etiology  . Syncope    a. Admitted 05/2009; magnetic resonance imagin/ MRA - negative; etiology thought to be orthostasis secondary to drugs and dehydration. b. Syncope 02/2015 also felt 2/2 dehydration.    Past Surgical History:  Procedure Laterality Date  . BACK SURGERY  1996  . BIOPSY N/A 11/08/2013   Procedure: BIOPSY  / Tissue sampling / ulcers present in small intestine;  Surgeon: West Bali, MD;  Location: [facility] SUITE;  Service: Endoscopy;  Laterality: N/A;  . CARDIAC CATHETERIZATION  2002  . CARDIAC CATHETERIZATION N/A 01/26/2015   Procedure: Right Heart Cath;  Surgeon: Laurey Morale, MD;  Location: [facility] INVASIVE CV LAB;  Service: Cardiovascular;  Laterality: N/A;  . CARDIOVASCULAR STRESS TEST  2008   Stress nuclear study  . CARDIOVERSION N/A 03/06/2015   Procedure: CARDIOVERSION;  Surgeon: Laurey Morale, MD;  Location: [facility] ENDOSCOPY;  Service: Cardiovascular;  Laterality: N/A;  .  CARPAL TUNNEL RELEASE  1994  . COLONOSCOPY  11/2011   Dr. Darrick Penna: Internal hemorrhoids, mild diverticulosis. Random colon biopsies negative.  . COLONOSCOPY N/A 11/08/2013   SLF: Normal mucosa in the terminal ileum/The colon IS redundant/  Moderate diverticulosis throughout the entire colon. ileum bx benign. colon bx benign  . ESOPHAGOGASTRODUODENOSCOPY  2008   Barrett's without dysplasia. esphagus dilated. antral erosions, h.pylori serologies  negative.  . ESOPHAGOGASTRODUODENOSCOPY  11/2011   Dr. Darrick Penna: Barrett's esophagus, mild gastritis, diverticulum in the second portion of the duodenum repeat EGD 3 years. Small bowel biopsies negative. Gastric biopsy show reactive gastropathy but no H. pylori. Esophageal biopsies consistent with GERD. Next EGD 11/2014  . ESOPHAGOGASTRODUODENOSCOPY N/A 11/21/2014   CRF:VOHK non-erosive gastritis/irregular z-line  . GIVENS CAPSULE STUDY  12/07/2011   Proximal small bowel, rare AVM. Distal small bowel, multiple ulcers noted  . GIVENS CAPSULE STUDY N/A 09/27/2013   Distal small bowel ulcers extending to TI.  Marland Kitchen GIVENS CAPSULE STUDY N/A 10/10/2013   Procedure: GIVENS CAPSULE STUDY;  Surgeon: West Bali, MD;  Location: [facility];  Service: Endoscopy;  Laterality: N/A;  7:30  . KNEE ARTHROSCOPY WITH MEDIAL MENISECTOMY Right 06/09/2016   Procedure: KNEE ARTHROSCOPY WITH MEDIAL MENISECTOMY;  Surgeon: Vickki Hearing, MD;  Location: [facility] ORS;  Service: Orthopedics;  Laterality: Right;  medial and lateral menisectomy  . LAMINECTOMY  1995   L4-L5  . LAPAROSCOPIC CHOLECYSTECTOMY  1990s  . LEFT HEART CATHETERIZATION WITH CORONARY ANGIOGRAM  01/10/2014   Procedure: LEFT HEART CATHETERIZATION WITH CORONARY ANGIOGRAM;  Surgeon: Lesleigh Noe, MD;  Location: [facility] CATH LAB;  Service: Cardiovascular;;  . RIGHT HEART CATHETERIZATION N/A 01/10/2014   Procedure: RIGHT HEART CATH;  Surgeon: Lesleigh Noe, MD;  Location: [facility] CATH LAB;  Service: Cardiovascular;  Laterality: N/A;  . TOTAL ABDOMINAL HYSTERECTOMY  1999  . TRACHEOSTOMY TUBE PLACEMENT N/A 09/01/2017   Procedure: TRACHEOSTOMY;  Surgeon: Drema Halon, MD;  Location: [facility] OR;  Service: ENT;  Laterality: N/A;    Allergies: Celexa [citalopram hydrobromide]; Codeine; Dilaudid [hydromorphone hcl]; and Reglan [metoclopramide]  Medications: Prior to Admission medications   Medication Sig Start Date End Date Taking? Authorizing Provider  acetaminophen  (TYLENOL) 500 MG tablet Take 500 mg by mouth every 6 (six) hours as needed for moderate pain or headache.   Yes [provider]  amiodarone (PACERONE) 200 MG tablet Take 1 tablet (200 mg total) by mouth every morning. 03/24/17  Yes Branch, Dorothe Pea, MD  apixaban (ELIQUIS) 5 MG TABS tablet Take 1 tablet (5 mg total) by mouth 2 (two) times daily. 06/09/16  Yes Vickki Hearing, MD  Cholecalciferol (VITAMIN D3) 2000 units capsule Take 2,000 Units by mouth daily. 10/25/17  Yes [provider]  dicyclomine (BENTYL) 10 MG capsule Take 1-2 capsules by mouth 3 (three) times daily with meals as needed. 01/19/18  Yes [provider]  DULoxetine (CYMBALTA) 30 MG capsule Take 30 mg by mouth 2 (two) times daily. 11/24/17  Yes [provider]  gabapentin (NEURONTIN) 400 MG capsule Take 400-800 mg by mouth 3 (three) times daily. Take 1 capsule(400mg ) by mouth twice a day and take 2 capsules(800mg ) by mouth at bedtime 07/18/17  Yes [provider]  glipiZIDE (GLUCOTROL) 10 MG tablet Take 10 mg by mouth 2 (two) times daily. 11/24/17  Yes [provider]  insulin aspart (NOVOLOG) 100 UNIT/ML injection Inject 0-20 Units into the skin every 4 (four) hours. CBG 0-120--no units; 121-150--3 units;  151-200--4 units; 201-250--7units; 251-300--11 units; 301-350--15 units; 351-400--20 units 08/22/17  Yes Tat, David, MD  insulin glargine (LANTUS) 100 UNIT/ML injection Inject 0.55 mLs (55 Units total) into the skin at bedtime. 08/22/17  Yes Tat, Onalee Hua, MD  levETIRAcetam (KEPPRA) 1000 MG tablet Take 1,000 mg by mouth 2 (two) times daily. 07/18/17  Yes [provider]  levothyroxine (SYNTHROID, LEVOTHROID) 137 MCG tablet Take 137 mcg by mouth daily. 10/14/14  Yes [provider]  LORazepam (ATIVAN) 1 MG tablet Take 1 mg by mouth 3 (three) times daily as needed. for anxiety 11/24/17  Yes [provider]  magnesium oxide (MAG-OX) 400 MG tablet Take 1 tablet (400  mg total) by mouth 2 (two) times daily. 12/26/17  Yes Branch, Dorothe Pea, MD  metFORMIN (GLUCOPHAGE) 500 MG tablet Take 500 mg by mouth 2 (two) times daily with a meal.   Yes [provider]  Multiple Vitamin (MULTIVITAMIN WITH MINERALS) TABS tablet Take 1 tablet by mouth daily.   Yes [provider]  nystatin (MYCOSTATIN/NYSTOP) powder Apply topically 2 (two) times daily. To affected area. Keep dry. Patient taking differently: Apply 1 Bottle topically 2 (two) times daily as needed. To affected area. Keep dry. 11/24/17  Yes Dunn, Dayna N, PA-C  ondansetron (ZOFRAN) 4 MG tablet TAKE 1 TABLET BY MOUTH FOUR TIMES DAILY AS NEEDED FOR NAUSEA 01/10/18  Yes Gelene Mink, NP  OXYGEN 2lpm with sleep and as needed during the day   Yes [provider]  pantoprazole (PROTONIX) 40 MG tablet TAKE ONE TABLET BY MOUTH TWICE DAILY BEFORE APPLY MEAL 02/07/17  Yes Gelene Mink, NP  potassium chloride (K-DUR,KLOR-CON) 10 MEQ tablet TAKE TWO TABLETS BY MOUTH TWICE DAILY 04/20/17  Yes Antoine Poche, MD  torsemide (DEMADEX) 20 MG tablet Take 2 tablets (40mg ) in the morning and 1 tablet (20mg ) in the evening every day. May take and additonal 20 mg as needed for swelling. 12/20/17  Yes Branch, Dorothe Pea, MD  traMADol (ULTRAM) 50 MG tablet Take 50 mg by mouth 2 (two) times daily as needed. 01/03/18  Yes [provider]  loperamide (IMODIUM) 2 MG capsule TAKE ONE CAPSULE BY MOUTH TWICE DAILY AS NEEDED 02/08/18   Gelene Mink, NP     Family History  Problem Relation Age of Onset  . Hypertension Mother   . Alzheimer's disease Mother   . Stroke Mother   . Heart attack Mother   . Hypertension Other   . Breast cancer Sister   . Heart disease Neg Hx   . Colon cancer Neg Hx     Social History   Socioeconomic History  . Marital status: Married    Spouse name: Not on file  . Number of children: Not on file  . Years of education: Not on file  . Highest education level: Not on file    Occupational History  . Occupation: Passenger transport manager at [employer]    Employer: [employer]    Comment: disabled    Employer: RETIRED  Social Needs  . Financial resource strain: Not on file  . Food insecurity:    Worry: Not on file    Inability: Not on file  . Transportation needs:    Medical: Not on file    Non-medical: Not on file  Tobacco Use  . Smoking status: Former Smoker    Packs/day: 0.25    Years: 15.00    Pack years: 3.75    Types: Cigarettes    Start date: 02/26/1966  Last attempt to quit: 07/01/1983    Years since quitting: 34.6  . Smokeless tobacco: Never Used  . Tobacco comment: quit in 1984  Substance and Sexual Activity  . Alcohol use: No    Alcohol/week: 0.0 standard drinks  . Drug use: No  . Sexual activity: Yes    Birth control/protection: None, Surgical  Lifestyle  . Physical activity:    Days per week: Not on file    Minutes per session: Not on file  . Stress: Not on file  Relationships  . Social connections:    Talks on phone: Not on file    Gets together: Not on file    Attends religious service: Not on file    Active member of club or organization: Not on file    Attends meetings of clubs or organizations: Not on file    Relationship status: Not on file  Other Topics Concern  . Not on file  Social History Narrative   Sedentary   4 children, "blended family"     Review of Systems: A 12 point ROS discussed and pertinent positives are indicated in the HPI above.  All other systems are negative. Review of Systems  Vital Signs: BP 116/67   Pulse 61   Temp 98.1 F (36.7 C) (Oral)   Resp 16   Ht 5\' 8"  (1.727 m)   Wt 107.5 kg   SpO2 92%   BMI 36.04 kg/m   Physical Exam  Constitutional: She is oriented to person, place, and time.  Obese, NAD  HENT:  Head: Normocephalic and atraumatic.  Eyes: EOM are normal.  Neck: Normal range of motion.  Cardiovascular: Normal rate, regular rhythm and normal heart sounds.  Pulmonary/Chest: Effort normal  and breath sounds normal.  85% on Room air. Placed on O2 Stephens 2 liters  Abdominal: Soft. There is no tenderness.  Musculoskeletal: Normal range of motion.       Thoracic back: She exhibits tenderness and pain.       Back:  Neurological: She is alert and oriented to person, place, and time.  Skin: Skin is warm and dry.  Psychiatric: She has a normal mood and affect. Her behavior is normal. Judgment and thought content normal.  Vitals reviewed.   Imaging: Dg Chest 2 View  Result Date: 01/27/2018 CLINICAL DATA:  Cough and short of breath EXAM: CHEST - 2 VIEW COMPARISON:  CT 11/26/2017, radiograph 11/25/2017, 08/27/2017 FINDINGS: Linear scarring at the bilateral lung bases. No acute consolidation or pleural effusion. Stable enlarged cardiomediastinal silhouette. Aortic atherosclerosis. No pneumothorax. Degenerative changes of the spine. Moderate compression deformity T12. IMPRESSION: 1. Cardiomegaly 2. Stable linear scarring at both lung bases. No acute pulmonary infiltrate. Electronically Signed   By: Jasmine Pang M.D.   On: 01/27/2018 17:57   Dg Foot Complete Right  Result Date: 01/11/2018 CLINICAL DATA:  Right foot pain after fall several months ago. EXAM: RIGHT FOOT COMPLETE - 3+ VIEW COMPARISON:  None. FINDINGS: There is no evidence of fracture or dislocation. Joint spaces are unremarkable. Moderate posterior calcaneal spurring is noted. Soft tissues are unremarkable. IMPRESSION: No significant abnormality seen in the right foot. Electronically Signed   By: Lupita Raider, M.D.   On: 01/11/2018 09:07    Labs:  CBC: Recent Labs    11/24/17 1555 11/25/17 2014 12/08/17 1516 12/25/17 0750  WBC 7.0 8.8 8.7 7.7  HGB 11.0* 11.4* 12.2 11.0*  HCT 35.0* 36.2 37.0 32.9*  PLT 213 208 230.0 191  COAGS: Recent Labs    08/23/17 0737  INR 1.57    BMP: Recent Labs    11/25/17 2014 11/27/17 0406 11/28/17 0646 11/29/17 0715 12/25/17 0750  NA 136 142 142 141 139  K 4.5 3.7 3.9  3.7 4.0  CL 98* 104 101 99* 99  CO2 27 30 32 32 33*  GLUCOSE 168* 234* 233* 223* 191*  BUN 24* 17 13 14 23   CALCIUM 9.0 9.0 9.1 9.1 8.5*  CREATININE 1.44* 1.13* 1.17* 1.08* 1.28*  GFRNONAA 36* 49* 47* 52*  --   GFRAA 42* 57* 54* 60*  --     LIVER FUNCTION TESTS: Recent Labs    08/12/17 0425 08/13/17 0437 08/23/17 0737  08/26/17 0646 08/27/17 0259 08/28/17 0345 11/24/17 1555  BILITOT 0.4 0.7 0.8  --   --   --   --  0.7  AST 25 21 90*  --   --   --   --  19  ALT 19 19 65*  --   --   --   --  16  ALKPHOS 44 40 52  --   --   --   --  64  PROT 5.8* 6.1* 6.1*  --   --   --   --  6.6  ALBUMIN 2.3* 2.4* 2.4*   < > 2.0* 2.0* 1.9* 3.4*   < > = values in this interval not displayed.    TUMOR MARKERS: No results for input(s): AFPTM, CEA, CA199, CHROMGRNA in the last 8760 hours.  Assessment and Plan:  T12 compression fracture with severe lifestyle limiting back pain.  Will proceed with image guided kyphoplasty of T12 today by Dr. Corliss Skains.  Risks and benefits of T12 kyphoplasty were discussed with the patient including, but not limited to education regarding the natural healing process of compression fractures without intervention, bleeding, infection, cement migration which may cause spinal cord damage, paralysis, pulmonary embolism or even death.  This interventional procedure involves the use of X-rays and because of the nature of the planned procedure, it is possible that we will have prolonged use of X-ray fluoroscopy.  Potential radiation risks to you include (but are not limited to) the following: - A slightly elevated risk for cancer  several years later in life. This risk is typically less than 0.5% percent. This risk is low in comparison to the normal incidence of human cancer, which is 33% for women and 50% for men according to the American Cancer Society. - Radiation induced injury can include skin redness, resembling a rash, tissue breakdown / ulcers and hair loss  (which can be temporary or permanent).   The likelihood of either of these occurring depends on the difficulty of the procedure and whether you are sensitive to radiation due to previous procedures, disease, or genetic conditions.   IF your procedure requires a prolonged use of radiation, you will be notified and given written instructions for further action.  It is your responsibility to monitor the irradiated area for the 2 weeks following the procedure and to notify your physician if you are concerned that you have suffered a radiation induced injury.    All of the patient's questions were answered, patient is agreeable to proceed.  Consent signed and in chart.   Electronically Signed: Gwynneth Macleod, PA-C   02/09/2018, 7:45 AM      I spent a total of    25 Minutes in face to face in clinical consultation, greater than 50% of which  was counseling/coordinating care for kyphoplasty.

## 2018-02-09 NOTE — Procedures (Signed)
S/P T 12 balloon KP 

## 2018-02-09 NOTE — Discharge Instructions (Addendum)
° °  Percutaneous Vertebroplasty, Care After These instructions give you information on caring for yourself after your procedure. Your doctor may also give you more specific instructions. Call your doctor if you have any problems or questions after your procedure. Follow these instructions at home:  Take medicine as told by your doctor.  Keep your wound dry and covered for 24 hours or as told by your doctor.  Ask your doctor when you can bathe or shower.  Put an ice pack on your wound. ? Put ice in a plastic bag. ? Place a towel between your skin and the bag. ? Leave the ice on for 15-20 minutes, 3-4 times a day.  Rest in your bed for 24 hours or as told by your doctor.  Return to normal activities as told by your doctor.  Ask your doctor what stretches and exercises you can do.  Do not bend or lift anything heavy as told by your doctor. Contact a doctor if:  Your wound becomes red, puffy (swollen), or tender to the touch.  You are bleeding or leaking fluid from the wound.  You are sick to your stomach (nauseous) or throw up (vomit) for more than 24 hours after the procedure.  Your back pain does not get better.  You have a fever. Get help right away if:  You have bad back pain that comes on suddenly.  You cannot control when you pee (urinate) or poop (bowel movement).  You lose feeling (numbness) or have tingling in your legs or feet, or they become weak.  You have sudden weakness in your arms or legs.  You have shooting pain down your legs.  You have chest pain or a hard time breathing.  You feel dizzy or pass out (faint).  Your vision changes or you cannot talk as you normally do. This information is not intended to replace advice given to you by your health care provider. Make sure you discuss any questions you have with your health care provider. Document Released: 09/14/2009 Document Revised: 11/26/2015 Document Reviewed: 02/26/2013 Elsevier Interactive  Patient Education  2018 [employer]. 1. No stooping,bending or lifting more than 10 lbs  For 2 weeks. 2.Use walker to ambulate for 2 weeks. 3.RTC PRN 2 weeks

## 2018-02-13 ENCOUNTER — Encounter (HOSPITAL_COMMUNITY): Payer: Self-pay | Admitting: Interventional Radiology

## 2018-02-16 ENCOUNTER — Ambulatory Visit (HOSPITAL_COMMUNITY)
Admission: RE | Admit: 2018-02-16 | Discharge: 2018-02-16 | Disposition: A | Discharge status: Home / Self Care | Payer: Medicare Other | Source: Ambulatory Visit | Attending: Family Medicine | Admitting: Family Medicine

## 2018-02-16 ENCOUNTER — Other Ambulatory Visit (HOSPITAL_COMMUNITY)
Admission: RE | Admit: 2018-02-16 | Discharge: 2018-02-16 | Disposition: A | Discharge status: Home / Self Care | Payer: Medicare Other | Source: Ambulatory Visit | Attending: Nurse Practitioner | Admitting: Nurse Practitioner

## 2018-02-16 ENCOUNTER — Other Ambulatory Visit (HOSPITAL_COMMUNITY): Payer: Self-pay | Admitting: Family Medicine

## 2018-02-16 DIAGNOSIS — M7989 Other specified soft tissue disorders: Secondary | ICD-10-CM | POA: Diagnosis not present

## 2018-02-16 DIAGNOSIS — E782 Mixed hyperlipidemia: Secondary | ICD-10-CM | POA: Insufficient documentation

## 2018-02-16 DIAGNOSIS — E1165 Type 2 diabetes mellitus with hyperglycemia: Secondary | ICD-10-CM | POA: Insufficient documentation

## 2018-02-16 DIAGNOSIS — M79604 Pain in right leg: Secondary | ICD-10-CM | POA: Insufficient documentation

## 2018-02-16 DIAGNOSIS — R600 Localized edema: Secondary | ICD-10-CM | POA: Diagnosis not present

## 2018-02-16 DIAGNOSIS — Z7901 Long term (current) use of anticoagulants: Secondary | ICD-10-CM | POA: Diagnosis not present

## 2018-02-16 DIAGNOSIS — Z87891 Personal history of nicotine dependence: Secondary | ICD-10-CM | POA: Insufficient documentation

## 2018-02-16 DIAGNOSIS — R2241 Localized swelling, mass and lump, right lower limb: Secondary | ICD-10-CM | POA: Insufficient documentation

## 2018-02-16 LAB — LIPID PANEL
CHOLESTEROL: 167 mg/dL (ref 0–200)
HDL: 38 mg/dL — ABNORMAL LOW (ref >40)
LDL Cholesterol: 93 mg/dL (ref 0–99)
Total CHOL/HDL Ratio: 4.4 RATIO
Triglycerides: 179 mg/dL — ABNORMAL HIGH (ref <150)
VLDL: 36 mg/dL (ref 0–40)

## 2018-02-16 LAB — COMPREHENSIVE METABOLIC PANEL
ALBUMIN: 3.3 g/dL — AB (ref 3.5–5.0)
ALK PHOS: 70 U/L (ref 38–126)
ALT: 12 U/L (ref 0–44)
AST: 18 U/L (ref 15–41)
Anion gap: 8 (ref 5–15)
BILIRUBIN TOTAL: 0.7 mg/dL (ref 0.3–1.2)
BUN: 22 mg/dL (ref 8–23)
CALCIUM: 8.9 mg/dL (ref 8.9–10.3)
CO2: 31 mmol/L (ref 22–32)
Chloride: 103 mmol/L (ref 98–111)
Creatinine, Ser: 1.28 mg/dL — ABNORMAL HIGH (ref 0.44–1.00)
GFR calc Af Amer: 49 mL/min — ABNORMAL LOW (ref >60)
GFR, EST NON AFRICAN AMERICAN: 42 mL/min — AB (ref >60)
GLUCOSE: 142 mg/dL — AB (ref 70–99)
Potassium: 5 mmol/L (ref 3.5–5.1)
Sodium: 142 mmol/L (ref 135–145)
TOTAL PROTEIN: 6.3 g/dL — AB (ref 6.5–8.1)

## 2018-02-16 LAB — CBC WITH DIFFERENTIAL/PLATELET
BASOS PCT: 1 %
Basophils Absolute: 0.1 10*3/uL (ref 0.0–0.1)
Eosinophils Absolute: 0.2 10*3/uL (ref 0.0–0.7)
Eosinophils Relative: 2 %
HEMATOCRIT: 35.9 % — AB (ref 36.0–46.0)
HEMOGLOBIN: 10.8 g/dL — AB (ref 12.0–15.0)
Lymphocytes Relative: 29 %
Lymphs Abs: 2.6 10*3/uL (ref 0.7–4.0)
MCH: 29 pg (ref 26.0–34.0)
MCHC: 30.1 g/dL (ref 30.0–36.0)
MCV: 96.5 fL (ref 78.0–100.0)
MONOS PCT: 8 %
Monocytes Absolute: 0.7 10*3/uL (ref 0.1–1.0)
NEUTROS ABS: 5.2 10*3/uL (ref 1.7–7.7)
NEUTROS PCT: 60 %
Platelets: 233 10*3/uL (ref 150–400)
RBC: 3.72 MIL/uL — ABNORMAL LOW (ref 3.87–5.11)
RDW: 16.8 % — ABNORMAL HIGH (ref 11.5–15.5)
WBC: 8.8 10*3/uL (ref 4.0–10.5)

## 2018-02-16 LAB — HEMOGLOBIN A1C
HEMOGLOBIN A1C: 9.1 % — AB (ref 4.8–5.6)
MEAN PLASMA GLUCOSE: 214.47 mg/dL

## 2018-02-16 LAB — D-DIMER, QUANTITATIVE (NOT AT ARMC): D DIMER QUANT: 1.12 ug{FEU}/mL — AB (ref 0.00–0.50)

## 2018-02-16 LAB — TSH: TSH: 6.431 u[IU]/mL — AB (ref 0.350–4.500)

## 2018-02-16 LAB — BRAIN NATRIURETIC PEPTIDE: B NATRIURETIC PEPTIDE 5: 191 pg/mL — AB (ref 0.0–100.0)

## 2018-03-02 ENCOUNTER — Encounter: Payer: Self-pay | Admitting: Cardiology

## 2018-03-02 ENCOUNTER — Ambulatory Visit (INDEPENDENT_AMBULATORY_CARE_PROVIDER_SITE_OTHER): Payer: Medicare Other | Admitting: Cardiology

## 2018-03-02 VITALS — BP 136/78 | HR 76 | Ht 68.0 in | Wt 239.0 lb

## 2018-03-02 DIAGNOSIS — I480 Paroxysmal atrial fibrillation: Secondary | ICD-10-CM | POA: Diagnosis not present

## 2018-03-02 DIAGNOSIS — I5032 Chronic diastolic (congestive) heart failure: Secondary | ICD-10-CM

## 2018-03-02 NOTE — Progress Notes (Signed)
Clinical Summary [name] is a 67 y.o.female  seen today for follow up of the following medical problems.   1. Chronicsystolic/ diastolic CHF with mixed precap and post postcap Pulmonary Hypertension - echo 12/2013 PASP 85, moderate TR, RV mild to moderately dilated with normal function, could not eval diasotlic function due to afib, LVEF 50-55%. Biatrial enlargement.  - RHC 01/2015 PA 38/13 and calculated mean of 21, could not get a wedge however LVEDP 15. - repeat echo 10/2014 PASP 61 mmHg (down from 85 on prior echo), some evidence of RV dysfunction with RV TAPSE 1.4 and tissue anular systolic velocity of 7.5. Cannot evaluate diastolic function, however severe biatrial enlarement suggests significant dysfunction, - 01/2015 RHC mean PA 52, PCWP 31 - 01/2015 echo LVEF 55-60%, PASP 46  - after most recent RHC in 01/2015 she was admitted for diuresis. Discharge weight 170 lbs, reportedly down from 190 on admission. Marland Kitchen Discharged on lasix 60mg  bid.   - admit 11/2017 to [facility] with volume overload. Diuresed 1.4 L, weight 237 to 229 lbs. Cr was stable. Discharged on torsemide 40mg  in AM and 20mg  in PM - 11/2017 echo LVEF 60-65%, no WMAs, grade I diastolic dysfunction, PASP 46.  - since discharge doing well. Swelling is improving in legs. Home weights 229 at discahrge, up to 232   Home weights up 245 lbs, now down to 240 lbs. Swelling in legs improving. Taking toresmide 40mg  in AM and 20mg  in PM. She associates weight gain to inactivity due to recent back injury. Gradual increase of weight she reports.  - BNP lower than baseline, does not appear volume overloaded by exam.    2. Afib - amio started 02/2015.  - has had severe diffusion dfect on PFTs 04/2015 prior  - no recent palpitations. No bleeding on eliquis.   3. OSA - could not tolerate CPAP     Past Medical History:  Diagnosis Date  . Abnormal pulmonary function test   . Anemia    H/H of 10/30 with a normal MCV in 12/09    . Anxiety   . Arthritis   . Barrett's esophagus    Diagnosed 1995. Last EGD 2016-NO BARRETT'S.   . Chest pain    Negative cardiac catheterization in 2002; negative stress nuclear study in 2008  . Chronic anticoagulation   . Chronic combined systolic and diastolic CHF (congestive heart failure) (HCC)    a. EF predominantly normal during prior echoes but was 40% during 10/2014 echo. b. Most recent 01/2015 EF normal, 55-60%.  . Chronic LBP    Surgical intervention in 1996  . Diabetes mellitus, type 2 (HCC)    Insulin therapy; exacerbated by prednisone  . Dysrhythmia    AFib  . Gastroparesis    99% retention 05/2008 on GES  . GERD (gastroesophageal reflux disease)   . Hiatal hernia   . Hyperlipidemia   . Hypertension   . Hypothyroid   . IBS (irritable bowel syndrome)   . Obesity   . OSA on CPAP    had CPAP and cannot tolerate.  . Paroxysmal atrial fibrillation (HCC)   . Pulmonary hypertension (HCC) 01/2015   a. Predominantly pulmonary venous hypertension but may be component of PAH.  . Seizures (HCC)    last seizure was 2 years ago; on keppra for this; unknown etiology  . Syncope    a. Admitted 05/2009; magnetic resonance imagin/ MRA - negative; etiology thought to be orthostasis secondary to drugs and dehydration. b. Syncope  02/2015 also felt 2/2 dehydration.     Allergies  Allergen Reactions  . Celexa [Citalopram Hydrobromide] Other (See Comments)    Dyskinesia  . Codeine Nausea And Vomiting and Other (See Comments)    HALLUCINATIONS  . Dilaudid [Hydromorphone Hcl] Other (See Comments)    Made her pass out  . Reglan [Metoclopramide] Other (See Comments)    DYSKINESIA     Current Outpatient Medications  Medication Sig Dispense Refill  . acetaminophen (TYLENOL) 500 MG tablet Take 500 mg by mouth every 6 (six) hours as needed for moderate pain or headache.    Marland Kitchen amiodarone (PACERONE) 200 MG tablet Take 1 tablet (200 mg total) by mouth every morning. 90 tablet 3  .  apixaban (ELIQUIS) 5 MG TABS tablet Take 1 tablet (5 mg total) by mouth 2 (two) times daily. 60 tablet 0  . Cholecalciferol (VITAMIN D3) 2000 units capsule Take 2,000 Units by mouth daily.  11  . dicyclomine (BENTYL) 10 MG capsule Take 1-2 capsules by mouth 3 (three) times daily with meals as needed.  3  . DULoxetine (CYMBALTA) 30 MG capsule Take 30 mg by mouth 2 (two) times daily.  0  . gabapentin (NEURONTIN) 400 MG capsule Take 400-800 mg by mouth 3 (three) times daily. Take 1 capsule(400mg ) by mouth twice a day and take 2 capsules(800mg ) by mouth at bedtime  11  . glipiZIDE (GLUCOTROL) 10 MG tablet Take 10 mg by mouth 2 (two) times daily.  11  . insulin aspart (NOVOLOG) 100 UNIT/ML injection Inject 0-20 Units into the skin every 4 (four) hours. CBG 0-120--no units; 121-150--3 units; 151-200--4 units; 201-250--7units; 251-300--11 units; 301-350--15 units; 351-400--20 units 10 mL 0  . insulin glargine (LANTUS) 100 UNIT/ML injection Inject 0.55 mLs (55 Units total) into the skin at bedtime. 10 mL 0  . levETIRAcetam (KEPPRA) 1000 MG tablet Take 1,000 mg by mouth 2 (two) times daily.  1  . levothyroxine (SYNTHROID, LEVOTHROID) 137 MCG tablet Take 137 mcg by mouth daily.    Marland Kitchen loperamide (IMODIUM) 2 MG capsule TAKE ONE CAPSULE BY MOUTH TWICE DAILY AS NEEDED 60 capsule 3  . LORazepam (ATIVAN) 1 MG tablet Take 1 mg by mouth 3 (three) times daily as needed. for anxiety  5  . magnesium oxide (MAG-OX) 400 MG tablet Take 1 tablet (400 mg total) by mouth 2 (two) times daily. 180 tablet 1  . metFORMIN (GLUCOPHAGE) 500 MG tablet Take 500 mg by mouth 2 (two) times daily with a meal.    . Multiple Vitamin (MULTIVITAMIN WITH MINERALS) TABS tablet Take 1 tablet by mouth daily.    Marland Kitchen nystatin (MYCOSTATIN/NYSTOP) powder Apply topically 2 (two) times daily. To affected area. Keep dry. (Patient taking differently: Apply 1 Bottle topically 2 (two) times daily as needed. To affected area. Keep dry.) 15 g 0  . ondansetron  (ZOFRAN) 4 MG tablet TAKE 1 TABLET BY MOUTH FOUR TIMES DAILY AS NEEDED FOR NAUSEA 120 tablet 1  . OXYGEN 2lpm with sleep and as needed during the day    . pantoprazole (PROTONIX) 40 MG tablet TAKE ONE TABLET BY MOUTH TWICE DAILY BEFORE APPLY MEAL 180 tablet 3  . potassium chloride (K-DUR,KLOR-CON) 10 MEQ tablet TAKE TWO TABLETS BY MOUTH TWICE DAILY 120 tablet 11  . torsemide (DEMADEX) 20 MG tablet Take 2 tablets (40mg ) in the morning and 1 tablet (20mg ) in the evening every day. May take and additonal 20 mg as needed for swelling. 120 tablet 3  . traMADol (ULTRAM) 50  MG tablet Take 50 mg by mouth 2 (two) times daily as needed.     No current facility-administered medications for this visit.      Past Surgical History:  Procedure Laterality Date  . BACK SURGERY  1996  . BIOPSY N/A 11/08/2013   Procedure: BIOPSY  / Tissue sampling / ulcers present in small intestine;  Surgeon: West Bali, MD;  Location: [facility];  Service: Endoscopy;  Laterality: N/A;  . CARDIAC CATHETERIZATION  2002  . CARDIAC CATHETERIZATION N/A 01/26/2015   Procedure: Right Heart Cath;  Surgeon: Laurey Morale, MD;  Location[facility] INVASIVE CV LAB;  Service: Cardiovascular;  Laterality: N/A;  . CARDIOVASCULAR STRESS TEST  2008   Stress nuclear study  . CARDIOVERSION N/A 03/06/2015   Procedure: CARDIOVERSION;  Surgeon: Laurey Morale, MD;  Location: [facility] ENDOSCOPY;  Service: Cardiovascular;  Laterality: N/A;  . CARPAL TUNNEL RELEASE  1994  . COLONOSCOPY  11/2011   Dr. Darrick Penna: Internal hemorrhoids, mild diverticulosis. Random colon biopsies negative.  . COLONOSCOPY N/A 11/08/2013   SLF: Normal mucosa in the terminal ileum/The colon IS redundant/  Moderate diverticulosis throughout the entire colon. ileum bx benign. colon bx benign  . ESOPHAGOGASTRODUODENOSCOPY  2008   Barrett's without dysplasia. esphagus dilated. antral erosions, h.pylori serologies negative.  . ESOPHAGOGASTRODUODENOSCOPY  11/2011   Dr. Darrick Penna: Barrett's  esophagus, mild gastritis, diverticulum in the second portion of the duodenum repeat EGD 3 years. Small bowel biopsies negative. Gastric biopsy show reactive gastropathy but no H. pylori. Esophageal biopsies consistent with GERD. Next EGD 11/2014  . ESOPHAGOGASTRODUODENOSCOPY N/A 11/21/2014   XTA:VWPV non-erosive gastritis/irregular z-line  . GIVENS CAPSULE STUDY  12/07/2011   Proximal small bowel, rare AVM. Distal small bowel, multiple ulcers noted  . GIVENS CAPSULE STUDY N/A 09/27/2013   Distal small bowel ulcers extending to TI.  Marland Kitchen GIVENS CAPSULE STUDY N/A 10/10/2013   Procedure: GIVENS CAPSULE STUDY;  Surgeon: West Bali, MD;  Location: [facility];  Service: Endoscopy;  Laterality: N/A;  7:30  . IR KYPHO THORACIC WITH BONE BIOPSY  02/09/2018  . KNEE ARTHROSCOPY WITH MEDIAL MENISECTOMY Right 06/09/2016   Procedure: KNEE ARTHROSCOPY WITH MEDIAL MENISECTOMY;  Surgeon: Vickki Hearing, MD;  Location: [facility] ORS;  Service: Orthopedics;  Laterality: Right;  medial and lateral menisectomy  . LAMINECTOMY  1995   L4-L5  . LAPAROSCOPIC CHOLECYSTECTOMY  1990s  . LEFT HEART CATHETERIZATION WITH CORONARY ANGIOGRAM  01/10/2014   Procedure: LEFT HEART CATHETERIZATION WITH CORONARY ANGIOGRAM;  Surgeon: Lesleigh Noe, MD;  [facility] CATH LAB;  Service: Cardiovascular;;  . RIGHT HEART CATHETERIZATION N/A 01/10/2014   Procedure: RIGHT HEART CATH;  Surgeon: Lesleigh Noe, MD;  Location: [facility] CATH LAB;  Service: Cardiovascular;  Laterality: N/A;  . TOTAL ABDOMINAL HYSTERECTOMY  1999  . TRACHEOSTOMY TUBE PLACEMENT N/A 09/01/2017   Procedure: TRACHEOSTOMY;  Surgeon: Drema Halon, MD;  Location: [facility] OR;  Service: ENT;  Laterality: N/A;     Allergies  Allergen Reactions  . Celexa [Citalopram Hydrobromide] Other (See Comments)    Dyskinesia  . Codeine Nausea And Vomiting and Other (See Comments)    HALLUCINATIONS  . Dilaudid [Hydromorphone Hcl] Other (See Comments)    Made her pass out  . Reglan  [Metoclopramide] Other (See Comments)    DYSKINESIA      Family History  Problem Relation Age of Onset  . Hypertension Mother   . Alzheimer's disease Mother   . Stroke Mother   .  Heart attack Mother   . Hypertension Other   . Breast cancer Sister   . Heart disease Neg Hx   . Colon cancer Neg Hx      Social History [name] reports that she quit smoking about 34 years ago. Her smoking use included cigarettes. She started smoking about 52 years ago. She has a 3.75 pack-year smoking history. She has never used smokeless tobacco. [name] reports that she does not drink alcohol.   Review of Systems CONSTITUTIONAL: No weight loss, fever, chills, weakness or fatigue.  HEENT: Eyes: No visual loss, blurred vision, double vision or yellow sclerae.No hearing loss, sneezing, congestion, runny nose or sore throat.  SKIN: No rash or itching.  CARDIOVASCULAR: per hpi RESPIRATORY: No shortness of breath, cough or sputum.  GASTROINTESTINAL: No anorexia, nausea, vomiting or diarrhea. No abdominal pain or blood.  GENITOURINARY: No burning on urination, no polyuria NEUROLOGICAL: No headache, dizziness, syncope, paralysis, ataxia, numbness or tingling in the extremities. No change in bowel or bladder control.  MUSCULOSKELETAL: back and leg pain LYMPHATICS: No enlarged nodes. No history of splenectomy.  PSYCHIATRIC: No history of depression or anxiety.  ENDOCRINOLOGIC: No reports of sweating, cold or heat intolerance. No polyuria or polydipsia.  Marland Kitchen   Physical Examination Vitals:   03/02/18 1530  BP: 136/78  Pulse: 76  SpO2: 95%   Vitals:   03/02/18 1530  Weight: 239 lb (108.4 kg)  Height: 5\' 8"  (1.727 m)    Gen: resting comfortably, no acute distress HEENT: no scleral icterus, pupils equal round and reactive, no palptable cervical adenopathy,  CV: RRR, no m/r/g, no jvd Resp: Clear to auscultation bilaterally GI: abdomen is soft, non-tender, non-distended, normal bowel sounds,  no hepatosplenomegaly MSK: extremities are warm, no edema.  Skin: warm, no rash Neuro:  no focal deficits Psych: appropriate affect   Diagnostic Studies 12/2013 Echo Study Conclusions  - Limited study to evaluate LV systolic function. - Left ventricle: The cavity size was normal. Wall thickness was normal. Systolic function was normal. The estimated ejection fraction was in the range of 50% to 55%. Wall motion was normal; there were no regional wall motion abnormalities. The study was not technically sufficient to allow evaluation of LV diastolic dysfunction due to atrial fibrillation. - Aortic valve: Valve area (VTI): 2.49 cm^2. Valve area (Vmax): 2.11 cm^2. - Mitral valve: There was moderate regurgitation. The MR vena contracta is 0.4 cm. - Left atrium: The atrium was severely dilated. - Right ventricle: The cavity size was mildly to moderately dilated. - Right atrium: The atrium was severely dilated. - Atrial septum: From available images no evidence of ASD or PFO by 2D or color Doppler images. - Tricuspid valve: There was moderate regurgitation. - Pulmonary arteries: Systolic pressure was severely increased. PA peak pressure: 85 mm Hg (S).  01/2014 RHC HEMODYNAMICS: Aortic pressure 123/73 mmHg; LV pressure 122/6 mmHg; LVEDP 15 mm mercury; RA 4 mmHg; RV 31/5; PA 38/13 mmHg; PCWP(mean) unable to wedge the balloon tip catheter; Cardiac Output by Fick 4.73 L per minute  ANGIOGRAPHIC DATA: The left main coronary artery is normal.  The left anterior descending artery is widely patent, traverses the LV apex, gives origin to 2 diagonals, and has no significant obstruction..  The left circumflex artery is widely patent and gives a single large obtuse marginal Walaa Carel that trifurcates on the lateral wall. No significant obstruction.  The right coronary artery is dominant, gives 3 left ventricular branches and a large bifurcating PDA. No obstructions  noted.Marland Kitchen  LEFT VENTRICULOGRAM: Left ventricular angiogram was done in the 30 RAO projection and revealed normal LV cavity size, tachycardia due to atrial fibrillation, and mild global reduction in LV function with an estimated ejection fraction of 40%   IMPRESSIONS: 1. Widely patent coronary arteries 2. Mild pulmonary hypertension 3. Mildly depressed LV systolic function with an estimated EF of 40% 3. This did not confirm significant pulmonary hypertension. The procedure did suggest that the right atrium and right ventricle were enlarged as we had difficulty manipulating the right heart catheter into the pulmonary artery.   RECOMMENDATION: Back to the outpatient area at home later today if no complications. She should not resume eloquence until 24 hours post procedure..  10/2014 Echo Study Conclusions  - Left ventricle: The cavity size was normal. Wall thickness was increased in a pattern of mild LVH. There was mild concentric hypertrophy. The estimated ejection fraction was 40%. Diffuse hypokinesis. - Mitral valve: Calcified annulus. There was mild regurgitation. - Left atrium: The atrium was severely dilated. - Right atrium: The atrium was severely dilated. - Atrial septum: Thinned and stretched but no obvious PFO. No defect or patent foramen ovale was identified. - Tricuspid valve: There was moderate regurgitation. - Impressions: E/E&' not reported and in afib diastolic evaluation not possible.  Impressions:  - E/E&' not reported and in afib diastolic evaluation not possible.   01/2015 RHC    1. Severely elevated right and left heart filling pressures.  2. Primarily pulmonary venous hypertension (may be additional component of PAH with PVR 4.6 WU).   Plan:  I am going to admit her for IV diuresis.      Right Heart Pressures RHC Procedural Findings: Hemodynamics (mmHg) RA mean 23 RV 68/14 PA 70/36, mean 52 PCWP mean 31  Oxygen  saturations: PA 60% AO 98%  Cardiac Output (Fick) 4.54  Cardiac Index (Fick) 2.32  PVR 4.6 WU    11/2017 echo Study Conclusions  - Left ventricle: The cavity size was normal. There was moderate concentric hypertrophy. Systolic function was normal. The estimated ejection fraction was in the range of 60% to 65%. Wall motion was normal; there were no regional wall motion abnormalities. Doppler parameters are consistent with abnormal left ventricular relaxation (grade 1 diastolic dysfunction). - Aortic valve: Transvalvular velocity was within the normal range. There was no stenosis. There was trivial regurgitation. - Mitral valve: Transvalvular velocity was within the normal range. There was no evidence for stenosis. There was no regurgitation. - Left atrium: The atrium was severely dilated. - Right ventricle: The cavity size was normal. Wall thickness was normal. Systolic function was normal. - Tricuspid valve: There was mild regurgitation. - Pulmonary arteries: PA peak pressure: 46 mm Hg (S).    Assessment and Plan  1.Chronic diastolic HF with mixed pulmonary HTN -recent weight gain appears to be due to inactivity, calorie intake as opposed to fluid. No significant volume overload by exam, her recent BNP was lowest its been in several months - continue current diuretics. Weight trending down with exercise and better dietary habits  2. Afib - doing well without symptoms, continue current meds along with anticoag    F/u 4 months      Antoine Poche, M.D.

## 2018-03-02 NOTE — Patient Instructions (Signed)
Your physician recommends that you schedule a follow-up appointment in:  4 months with Dr.Branch     Your physician recommends that you continue on your current medications as directed. Please refer to the Current Medication list given to you today.    If you need a refill on your cardiac medications before your next appointment, please call your pharmacy.     No labs or tests today.       Thank you for choosing [facility] !         

## 2018-03-13 ENCOUNTER — Other Ambulatory Visit: Payer: Self-pay | Admitting: Cardiology

## 2018-03-16 ENCOUNTER — Ambulatory Visit: Payer: Medicare Other | Admitting: Internal Medicine

## 2018-04-23 ENCOUNTER — Other Ambulatory Visit: Payer: Self-pay | Admitting: Cardiology

## 2018-04-24 ENCOUNTER — Telehealth: Payer: Self-pay | Admitting: Cardiology

## 2018-04-24 NOTE — Telephone Encounter (Signed)
Patient's daughter states that patient is having tooth extracted on Friday. Wants to know if it is okay to go off of eliquis prior to this. / tg

## 2018-04-24 NOTE — Telephone Encounter (Signed)
I spoke with daughter and relayed dr.branch's message

## 2018-04-24 NOTE — Telephone Encounter (Signed)
Yes, hold eliquis starting 2 days prior to procedure and resume day after procedure.  Dominga Ferry MD

## 2018-04-24 NOTE — Telephone Encounter (Signed)
Spoke with daughter who states pt is to have one molar extracted.

## 2018-04-25 ENCOUNTER — Ambulatory Visit (INDEPENDENT_AMBULATORY_CARE_PROVIDER_SITE_OTHER): Payer: Medicare Other | Admitting: Gastroenterology

## 2018-04-25 ENCOUNTER — Other Ambulatory Visit: Payer: Self-pay | Admitting: *Deleted

## 2018-04-25 ENCOUNTER — Encounter

## 2018-04-25 ENCOUNTER — Encounter: Payer: Self-pay | Admitting: Gastroenterology

## 2018-04-25 VITALS — BP 119/69 | HR 76 | Temp 97.9°F | Ht 68.0 in | Wt 240.0 lb

## 2018-04-25 DIAGNOSIS — D500 Iron deficiency anemia secondary to blood loss (chronic): Secondary | ICD-10-CM

## 2018-04-25 DIAGNOSIS — K580 Irritable bowel syndrome with diarrhea: Secondary | ICD-10-CM

## 2018-04-25 DIAGNOSIS — K3184 Gastroparesis: Secondary | ICD-10-CM

## 2018-04-25 MED ORDER — DICYCLOMINE HCL 10 MG PO CAPS: 20.0000 mg | ORAL_CAPSULE | Freq: Two times a day (BID) | ORAL | 3 refills | Status: DC

## 2018-04-25 NOTE — Assessment & Plan Note (Signed)
Doing well with Protonix BID. Return in 6 months.

## 2018-04-25 NOTE — Patient Instructions (Signed)
I have refilled dicyclomine. You could take 2 capsules twice a day if it's easier to remember it this way, or one capsule before meals and at bedtime (no more than 4 per day).  I have referred you back to the hematologist to follow your iron.  We will see you in 6 months or sooner if needed!  It was a pleasure to see you today. I strive to create trusting relationships with patients to provide genuine, compassionate, and quality care. I value your feedback. If you receive a survey regarding your visit,  I greatly appreciate you taking time to fill this out.   Gelene Mink, PhD, ANP-BC [facility]

## 2018-04-25 NOTE — Assessment & Plan Note (Signed)
With diarrhea. Doing well now with Bentyl and Imodium. Return in 6 months. If flare, consider treatment with Xifaxan.

## 2018-04-25 NOTE — Progress Notes (Signed)
Referring Provider: Gareth Morgan, MD Primary Care Physician:  Gareth Morgan, MD Primary GI: Dr. Darrick Penna   Chief Complaint  Patient presents with  . Anemia  . Diarrhea    doing ok    HPI:   [name] is a 67 y.o. female presenting today with a history of chronic abdominal pain, gastroparesis, IBS-D, history of IDA. EGD in May 2016 due to anemia and history of Barrett's without evidence of Barrett's. Last colonoscopy in May 2015. BPE hasnoted small to moderate sized sliding type hiatal hernia with spontaneous and inducible reflux, nonspecific esophageal dysmotility. Capsule in April 2015 with idiopathic small bowel ulcers that have been present since 2013.   Has been inpatient, prolonged hospitalization with pneumonia and requiring trach. Trach removed. Daughter present.   IBS-D: last seen in Dec 2018 and had noted incontinence at times. Requested stool studies. Consideration for Xifaxan. Possible med effect with metformin Negative CDI in Feb and March 2019. Bentyl 20 mg BID.  Loperamide BID. No rectal bleeding. Back to baseline.   Gastoparesis:No N/V.   IDA: extensive eval in past. Unable to take oral iron. Needs follow-up with hematology.  Overall, patient at baseline and stable from GI perspective.   Past Medical History:  Diagnosis Date  . Abnormal pulmonary function test   . Anemia    H/H of 10/30 with a normal MCV in 12/09  . Anxiety   . Arthritis   . Barrett's esophagus    Diagnosed 1995. Last EGD 2016-NO BARRETT'S.   . Chest pain    Negative cardiac catheterization in 2002; negative stress nuclear study in 2008  . Chronic anticoagulation   . Chronic combined systolic and diastolic CHF (congestive heart failure) (HCC)    a. EF predominantly normal during prior echoes but was 40% during 10/2014 echo. b. Most recent 01/2015 EF normal, 55-60%.  . Chronic LBP    Surgical intervention in 1996  . Diabetes mellitus, type 2 (HCC)    Insulin therapy; exacerbated by  prednisone  . Dysrhythmia    AFib  . Gastroparesis    99% retention 05/2008 on GES  . GERD (gastroesophageal reflux disease)   . Hiatal hernia   . Hyperlipidemia   . Hypertension   . Hypothyroid   . IBS (irritable bowel syndrome)   . Obesity   . OSA on CPAP    had CPAP and cannot tolerate.  . Paroxysmal atrial fibrillation (HCC)   . Pulmonary hypertension (HCC) 01/2015   a. Predominantly pulmonary venous hypertension but may be component of PAH.  . Seizures (HCC)    last seizure was 2 years ago; on keppra for this; unknown etiology  . Syncope    a. Admitted 05/2009; magnetic resonance imagin/ MRA - negative; etiology thought to be orthostasis secondary to drugs and dehydration. b. Syncope 02/2015 also felt 2/2 dehydration.    Past Surgical History:  Procedure Laterality Date  . BACK SURGERY  1996  . BIOPSY N/A 11/08/2013   Procedure: BIOPSY  / Tissue sampling / ulcers present in small intestine;  Surgeon: West Bali, MD;  Location: [facility];  Service: Endoscopy;  Laterality: N/A;  . CARDIAC CATHETERIZATION  2002  . CARDIAC CATHETERIZATION N/A 01/26/2015   Procedure: Right Heart Cath;  Surgeon: Laurey Morale, MD;  Location: [facility] INVASIVE CV LAB;  Service: Cardiovascular;  Laterality: N/A;  . CARDIOVASCULAR STRESS TEST  2008   Stress nuclear study  . CARDIOVERSION N/A 03/06/2015   Procedure: CARDIOVERSION;  Surgeon:  Laurey Morale, MD;  Location: [facility] ENDOSCOPY;  Service: Cardiovascular;  Laterality: N/A;  . CARPAL TUNNEL RELEASE  1994  . COLONOSCOPY  11/2011   Dr. Darrick Penna: Internal hemorrhoids, mild diverticulosis. Random colon biopsies negative.  . COLONOSCOPY N/A 11/08/2013   SLF: Normal mucosa in the terminal ileum/The colon IS redundant/  Moderate diverticulosis throughout the entire colon. ileum bx benign. colon bx benign  . ESOPHAGOGASTRODUODENOSCOPY  2008   Barrett's without dysplasia. esphagus dilated. antral erosions, h.pylori serologies negative.  .  ESOPHAGOGASTRODUODENOSCOPY  11/2011   Dr. Darrick Penna: Barrett's esophagus, mild gastritis, diverticulum in the second portion of the duodenum repeat EGD 3 years. Small bowel biopsies negative. Gastric biopsy show reactive gastropathy but no H. pylori. Esophageal biopsies consistent with GERD. Next EGD 11/2014  . ESOPHAGOGASTRODUODENOSCOPY N/A 11/21/2014   WUJ:WJXB non-erosive gastritis/irregular z-line  . GIVENS CAPSULE STUDY  12/07/2011   Proximal small bowel, rare AVM. Distal small bowel, multiple ulcers noted  . GIVENS CAPSULE STUDY N/A 09/27/2013   Distal small bowel ulcers extending to TI.  Marland Kitchen GIVENS CAPSULE STUDY N/A 10/10/2013   Procedure: GIVENS CAPSULE STUDY;  Surgeon: West Bali, MD;  Location: [facility];  Service: Endoscopy;  Laterality: N/A;  7:30  . IR KYPHO THORACIC WITH BONE BIOPSY  02/09/2018  . KNEE ARTHROSCOPY WITH MEDIAL MENISECTOMY Right 06/09/2016   Procedure: KNEE ARTHROSCOPY WITH MEDIAL MENISECTOMY;  Surgeon: Vickki Hearing, MD;  Location: [facility] ORS;  Service: Orthopedics;  Laterality: Right;  medial and lateral menisectomy  . LAMINECTOMY  1995   L4-L5  . LAPAROSCOPIC CHOLECYSTECTOMY  1990s  . LEFT HEART CATHETERIZATION WITH CORONARY ANGIOGRAM  01/10/2014   Procedure: LEFT HEART CATHETERIZATION WITH CORONARY ANGIOGRAM;  Surgeon: Lesleigh Noe, MD;  Location: [facility] CATH LAB;  Service: Cardiovascular;;  . RIGHT HEART CATHETERIZATION N/A 01/10/2014   Procedure: RIGHT HEART CATH;  Surgeon: Lesleigh Noe, MD;  Location: [facility] CATH LAB;  Service: Cardiovascular;  Laterality: N/A;  . TOTAL ABDOMINAL HYSTERECTOMY  1999  . TRACHEOSTOMY TUBE PLACEMENT N/A 09/01/2017   Procedure: TRACHEOSTOMY;  Surgeon: Drema Halon, MD;  Location: [facility] OR;  Service: ENT;  Laterality: N/A;    Current Outpatient Medications  Medication Sig Dispense Refill  . acetaminophen (TYLENOL) 500 MG tablet Take 500 mg by mouth every 6 (six) hours as needed for moderate pain or headache.    Marland Kitchen amiodarone  (PACERONE) 200 MG tablet TAKE ONE TABLET BY MOUTH EVERY MORNING 90 tablet 3  . apixaban (ELIQUIS) 5 MG TABS tablet Take 1 tablet (5 mg total) by mouth 2 (two) times daily. 60 tablet 0  . Cholecalciferol (VITAMIN D3) 2000 units capsule Take 2,000 Units by mouth daily.  11  . DULoxetine (CYMBALTA) 30 MG capsule Take 30 mg by mouth 2 (two) times daily.  0  . gabapentin (NEURONTIN) 400 MG capsule Take 400-800 mg by mouth 3 (three) times daily. Take 1 capsule(400mg ) by mouth twice a day and take 2 capsules(800mg ) by mouth at bedtime  11  . glipiZIDE (GLUCOTROL) 10 MG tablet Take 10 mg by mouth 2 (two) times daily.  11  . insulin aspart (NOVOLOG) 100 UNIT/ML injection Inject 0-20 Units into the skin every 4 (four) hours. CBG 0-120--no units; 121-150--3 units; 151-200--4 units; 201-250--7units; 251-300--11 units; 301-350--15 units; 351-400--20 units 10 mL 0  . insulin glargine (LANTUS) 100 UNIT/ML injection Inject 0.55 mLs (55 Units total) into the skin at bedtime. 10 mL 0  . levETIRAcetam (KEPPRA) 1000 MG tablet Take  1,000 mg by mouth 2 (two) times daily.  1  . levothyroxine (SYNTHROID, LEVOTHROID) 137 MCG tablet Take 137 mcg by mouth daily.    Marland Kitchen loperamide (IMODIUM) 2 MG capsule TAKE ONE CAPSULE BY MOUTH TWICE DAILY AS NEEDED 60 capsule 3  . LORazepam (ATIVAN) 1 MG tablet Take 1 mg by mouth 3 (three) times daily as needed. for anxiety  5  . magnesium oxide (MAG-OX) 400 MG tablet Take 1 tablet (400 mg total) by mouth 2 (two) times daily. 180 tablet 1  . metFORMIN (GLUCOPHAGE) 500 MG tablet Take 500 mg by mouth 2 (two) times daily with a meal.    . Multiple Vitamin (MULTIVITAMIN WITH MINERALS) TABS tablet Take 1 tablet by mouth daily.    . naproxen sodium (ALEVE) 220 MG tablet Take 440 mg by mouth as needed.    . nystatin (MYCOSTATIN/NYSTOP) powder Apply topically 2 (two) times daily. To affected area. Keep dry. (Patient taking differently: Apply 1 Bottle topically 2 (two) times daily as needed. To affected  area. Keep dry.) 15 g 0  . ondansetron (ZOFRAN) 4 MG tablet TAKE 1 TABLET BY MOUTH FOUR TIMES DAILY AS NEEDED FOR NAUSEA 120 tablet 1  . OXYGEN 2lpm with sleep and as needed during the day    . pantoprazole (PROTONIX) 40 MG tablet TAKE ONE TABLET BY MOUTH TWICE DAILY BEFORE APPLY MEAL 180 tablet 3  . potassium chloride (K-DUR,KLOR-CON) 10 MEQ tablet TAKE TWO TABLETS BY MOUTH TWICE DAILY 120 tablet 3  . torsemide (DEMADEX) 20 MG tablet TAKE TWO TABLETS BY MOUTH IN THE MORNING AND TAKE 1 TABLET IN THE EVENING EVERY DAY (MAY TAKE ADDITIONAL TABLET AS NEEDED FOR SWELLING 120 tablet 3  . traMADol (ULTRAM) 50 MG tablet Take 50 mg by mouth 2 (two) times daily as needed.    . dicyclomine (BENTYL) 10 MG capsule Take 2 capsules (20 mg total) by mouth 2 (two) times daily. 360 capsule 3   No current facility-administered medications for this visit.     Allergies as of 04/25/2018 - Review Complete 04/25/2018  Allergen Reaction Noted  . Celexa [citalopram hydrobromide] Other (See Comments) 06/30/2013  . Codeine Nausea And Vomiting and Other (See Comments)   . Dilaudid [hydromorphone hcl] Other (See Comments) 12/31/2015  . Reglan [metoclopramide] Other (See Comments) 06/04/2014    Family History  Problem Relation Age of Onset  . Hypertension Mother   . Alzheimer's disease Mother   . Stroke Mother   . Heart attack Mother   . Hypertension Other   . Breast cancer Sister   . Heart disease Neg Hx   . Colon cancer Neg Hx     Social History   Socioeconomic History  . Marital status: Married    Spouse name: Not on file  . Number of children: Not on file  . Years of education: Not on file  . Highest education level: Not on file  Occupational History  . Occupation: Passenger transport manager at [employer]    Employer: [employer]    Comment: disabled    Employer: RETIRED  Social Needs  . Financial resource strain: Not on file  . Food insecurity:    Worry: Not on file    Inability: Not on file  . Transportation  needs:    Medical: Not on file    Non-medical: Not on file  Tobacco Use  . Smoking status: Former Smoker    Packs/day: 0.25    Years: 15.00    Pack years: 3.75  Types: Cigarettes    Start date: 02/26/1966    Last attempt to quit: 07/01/1983    Years since quitting: 34.8  . Smokeless tobacco: Never Used  . Tobacco comment: quit in 1984  Substance and Sexual Activity  . Alcohol use: No    Alcohol/week: 0.0 standard drinks  . Drug use: No  . Sexual activity: Yes    Birth control/protection: None, Surgical  Lifestyle  . Physical activity:    Days per week: Not on file    Minutes per session: Not on file  . Stress: Not on file  Relationships  . Social connections:    Talks on phone: Not on file    Gets together: Not on file    Attends religious service: Not on file    Active member of club or organization: Not on file    Attends meetings of clubs or organizations: Not on file    Relationship status: Not on file  Other Topics Concern  . Not on file  Social History Narrative   Sedentary   4 children, "blended family"    Review of Systems: Gen: Denies fever, chills, anorexia. Denies fatigue, weakness, weight loss.  CV: Denies chest pain, palpitations, syncope, peripheral edema, and claudication. Resp: Denies dyspnea at rest, cough, wheezing, coughing up blood, and pleurisy. GI: see HPI  Derm: Denies rash, itching, dry skin Psych: Denies depression, anxiety, memory loss, confusion. No homicidal or suicidal ideation.  Heme: Denies bruising, bleeding, and enlarged lymph nodes.  Physical Exam: BP 119/69   Pulse 76   Temp 97.9 F (36.6 C) (Oral)   Ht 5\' 8"  (1.727 m)   Wt 240 lb (108.9 kg)   BMI 36.49 kg/m  General:   Alert and oriented. No distress noted. Pleasant and cooperative.  Head:  Normocephalic and atraumatic. Eyes:  Conjuctiva clear without scleral icterus. Mouth:  Oral mucosa pink and moist. Abdomen:  +BS, soft, non-tender and non-distended. Limited exam  with patient sitting in chair Msk:  Symmetrical without gross deformities. Normal posture. Extremities:  Without edema. Neurologic:  Alert and  oriented x4 Psych:  Alert and cooperative. Normal mood and affect.

## 2018-04-25 NOTE — Assessment & Plan Note (Signed)
Refer back to Hematology. Extensive eval prior. Unable to tolerate oral iron.

## 2018-04-26 NOTE — Progress Notes (Signed)
CC'D TO PCP °

## 2018-04-27 IMAGING — RF DG ESOPHAGUS
10 of 13 series · 15 of 24 positions shown · non-contrast
Comparison: None

CLINICAL DATA: Difficulty swallowing and choking.

EXAM:
ESOPHOGRAM / BARIUM SWALLOW / BARIUM TABLET STUDY
TECHNIQUE: Combined double contrast and single contrast examination performed
using effervescent crystals, thick barium liquid, and thin barium
liquid. The patient was observed with fluoroscopy swallowing a 13 mm
barium sulphate tablet.
FLUOROSCOPY TIME:  Radiation Exposure Index (as provided by the
fluoroscopic device): 40.7 mGy
If the device does not provide the exposure index:
Fluoroscopy Time:  2 minutes and 20 seconds
Number of Acquired Images:

[Series 1: cp_standard · 0.34mm/px · 2 of 50 frames shown (1 of 10)]
[frame 4/50]
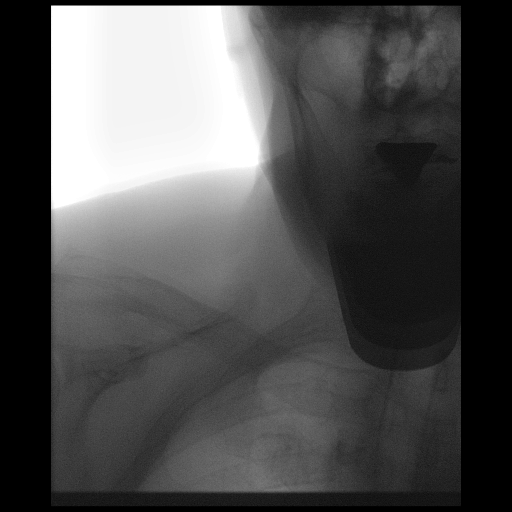
[frame 43/50]
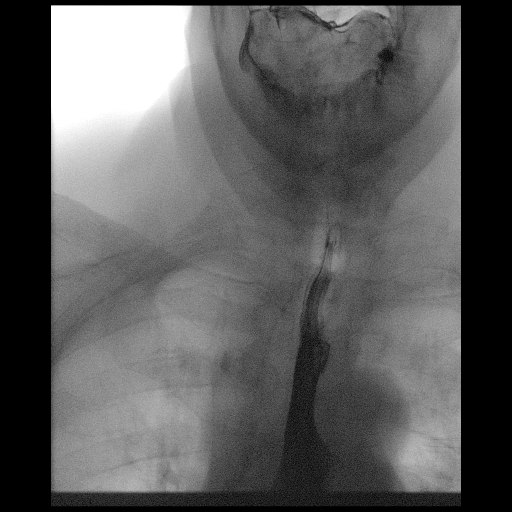

[Series 2: cp_standard · 0.34mm/px · 2 of 135 frames shown (2 of 10)]
[frame 21/135]
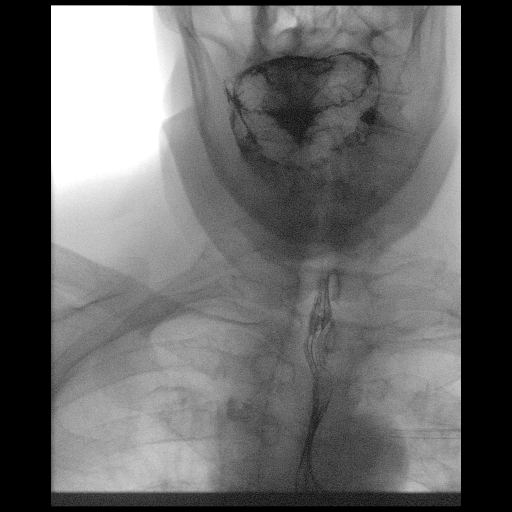
[frame 68/135]
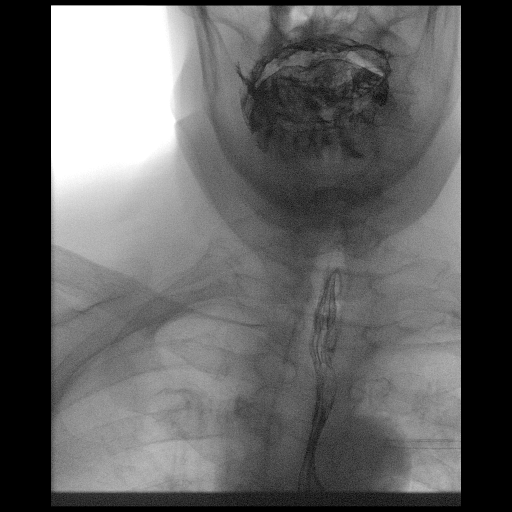

[Series 3: cp_standard · 0.34mm/px · 2 of 54 frames shown (3 of 10)]
[frame 9/54]
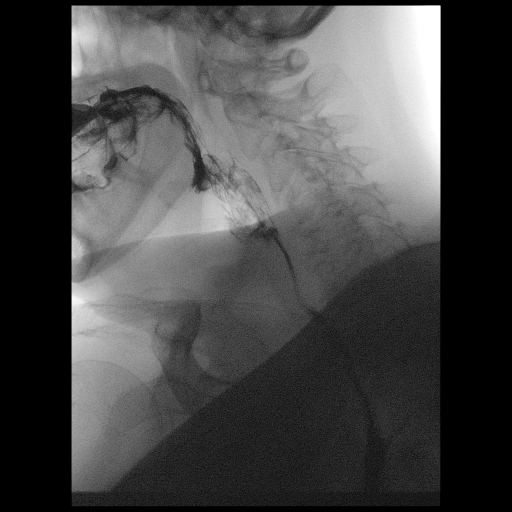
[frame 28/54]
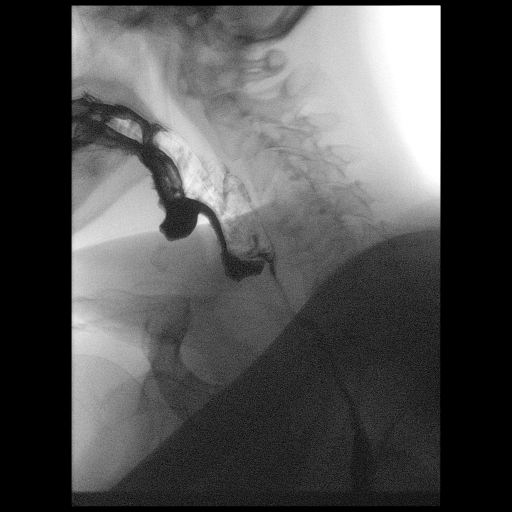

[Series 4: cp_standard · 0.51mm/px · 2 of 123 frames shown (4 of 10)]
[frame 20/123]
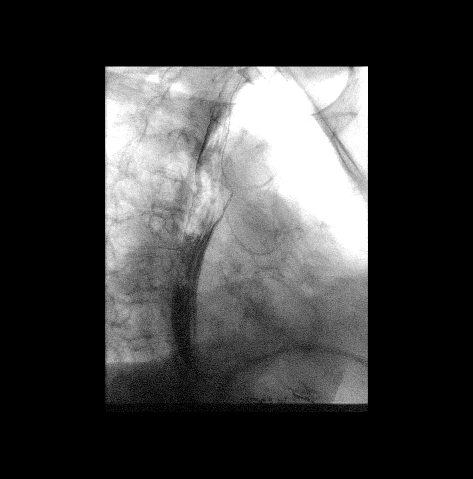
[frame 105/123]
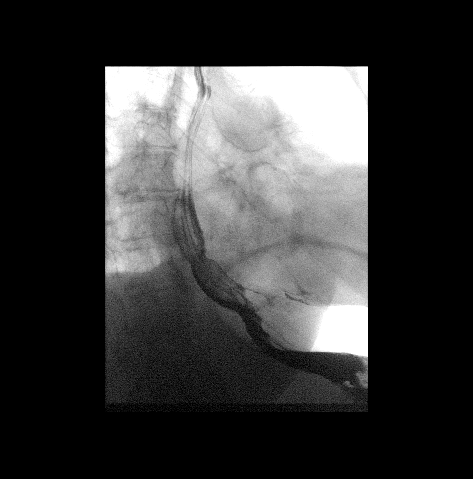

[Series 5: cp_standard · 0.26mm/px · 1 of 1 slices shown (5 of 10)]
[im 1/1]
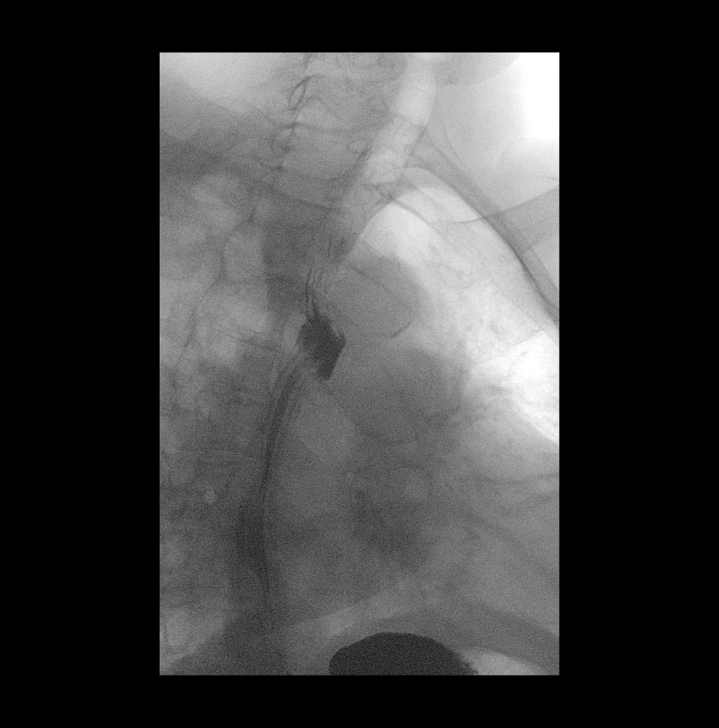

[Series 6: cp_standard · 0.53mm/px · 2 of 156 frames shown (6 of 10)]
[frame 133/156]
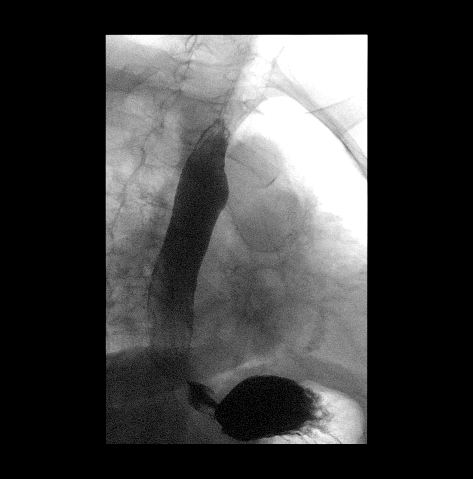
[frame 152/156  full-range]
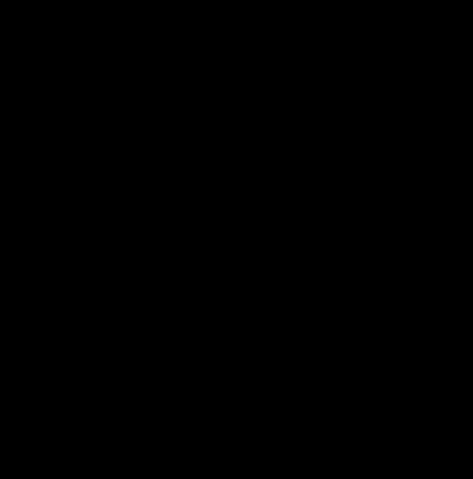

[Series 9: cp_standard · 0.26mm/px · 1 of 1 slices shown (7 of 10)]
[im 1/1]
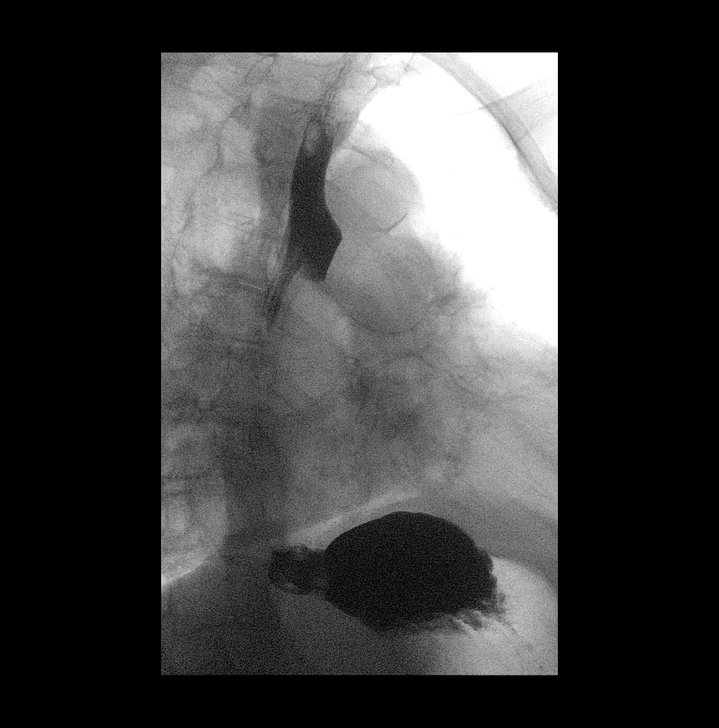

[Series 11: cp_standard · 0.26mm/px · 1 of 1 slices shown (8 of 10)]
[im 1/1]
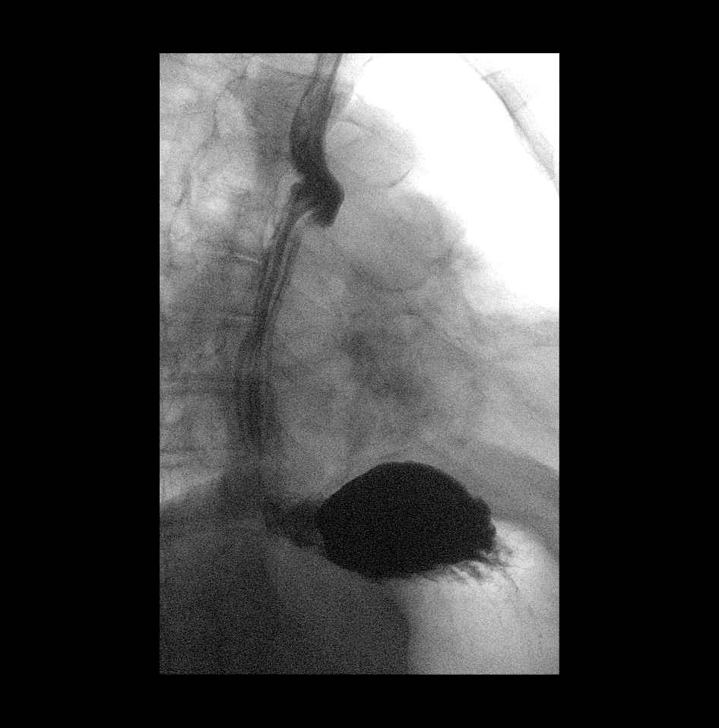

[Series 12: cp_standard · 0.27mm/px · 1 of 1 slices shown (9 of 10)]
[im 1/1]
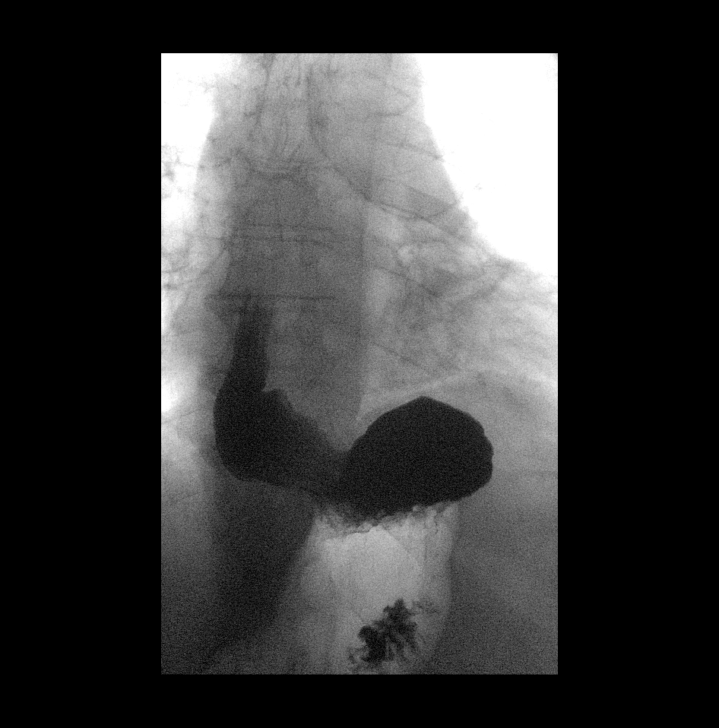

[Series 15: cp_standard · 0.27mm/px · 1 of 1 slices shown (10 of 10)]
[im 1/1]
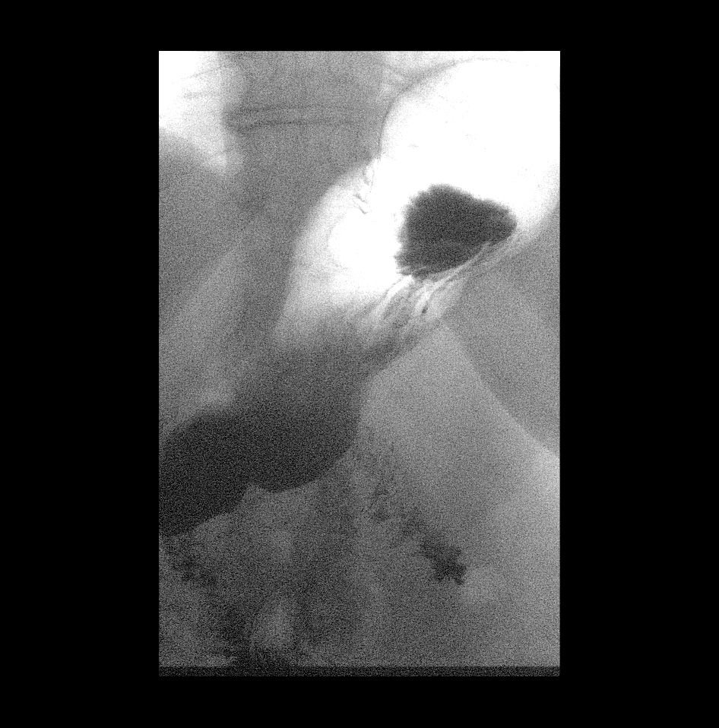

[15 of 24 positions shown; findings below may reference images not displayed]

FINDINGS: Initial barium swallows demonstrate normal pharyngeal motion with
swallowing. No laryngeal penetration or aspiration. No upper
esophageal webs, strictures or diverticuli.

Nonspecific esophageal dysmotility with occasional disruption of the
primary peristaltic wave and occasional tertiary contractions. No
intrinsic or extrinsic lesions are identified. The mucosal folds
appear normal.

Small to moderate-sized sliding-type hiatal hernia with spontaneous
and inducible GE reflux.

The 13 mm barium pill passed into the stomach without difficulty.
IMPRESSION: 1. Small to moderate-sized sliding-type hiatal hernia with
spontaneous and inducible GE reflux.
2. No stricture or mass.
3. Nonspecific esophageal dysmotility.

## 2018-05-04 ENCOUNTER — Ambulatory Visit (HOSPITAL_COMMUNITY): Payer: Medicare Other | Admitting: Internal Medicine

## 2018-05-11 ENCOUNTER — Inpatient Hospital Stay (HOSPITAL_COMMUNITY): Payer: Medicare Other

## 2018-05-11 ENCOUNTER — Inpatient Hospital Stay (HOSPITAL_COMMUNITY): Payer: Medicare Other | Attending: Internal Medicine | Admitting: Internal Medicine

## 2018-05-11 ENCOUNTER — Encounter (HOSPITAL_COMMUNITY): Payer: Self-pay | Admitting: Internal Medicine

## 2018-05-11 VITALS — BP 100/68 | HR 69 | Temp 99.7°F | Resp 18 | Ht 68.0 in | Wt 244.3 lb

## 2018-05-11 DIAGNOSIS — I5043 Acute on chronic combined systolic (congestive) and diastolic (congestive) heart failure: Secondary | ICD-10-CM | POA: Diagnosis not present

## 2018-05-11 DIAGNOSIS — E039 Hypothyroidism, unspecified: Secondary | ICD-10-CM | POA: Diagnosis not present

## 2018-05-11 DIAGNOSIS — D509 Iron deficiency anemia, unspecified: Secondary | ICD-10-CM | POA: Diagnosis not present

## 2018-05-11 DIAGNOSIS — D508 Other iron deficiency anemias: Secondary | ICD-10-CM

## 2018-05-11 DIAGNOSIS — I110 Hypertensive heart disease with heart failure: Secondary | ICD-10-CM | POA: Diagnosis not present

## 2018-05-11 LAB — CBC WITH DIFFERENTIAL/PLATELET
Abs Immature Granulocytes: 0.07 10*3/uL (ref 0.00–0.07)
BASOS ABS: 0.1 10*3/uL (ref 0.0–0.1)
BASOS PCT: 1 %
EOS ABS: 0.1 10*3/uL (ref 0.0–0.5)
EOS PCT: 1 %
HEMATOCRIT: 38.2 % (ref 36.0–46.0)
Hemoglobin: 11.6 g/dL — ABNORMAL LOW (ref 12.0–15.0)
Immature Granulocytes: 1 %
Lymphocytes Relative: 33 %
Lymphs Abs: 2.6 10*3/uL (ref 0.7–4.0)
MCH: 28.9 pg (ref 26.0–34.0)
MCHC: 30.4 g/dL (ref 30.0–36.0)
MCV: 95.3 fL (ref 80.0–100.0)
Monocytes Absolute: 0.6 10*3/uL (ref 0.1–1.0)
Monocytes Relative: 8 %
NRBC: 0 % (ref 0.0–0.2)
Neutro Abs: 4.5 10*3/uL (ref 1.7–7.7)
Neutrophils Relative %: 56 %
Platelets: 223 10*3/uL (ref 150–400)
RBC: 4.01 MIL/uL (ref 3.87–5.11)
RDW: 15.1 % (ref 11.5–15.5)
WBC: 8 10*3/uL (ref 4.0–10.5)

## 2018-05-11 LAB — COMPREHENSIVE METABOLIC PANEL
ALBUMIN: 3.6 g/dL (ref 3.5–5.0)
ALT: 14 U/L (ref 0–44)
ANION GAP: 8 (ref 5–15)
AST: 18 U/L (ref 15–41)
Alkaline Phosphatase: 70 U/L (ref 38–126)
BILIRUBIN TOTAL: 0.5 mg/dL (ref 0.3–1.2)
BUN: 27 mg/dL — ABNORMAL HIGH (ref 8–23)
CO2: 26 mmol/L (ref 22–32)
Calcium: 8.9 mg/dL (ref 8.9–10.3)
Chloride: 101 mmol/L (ref 98–111)
Creatinine, Ser: 1.34 mg/dL — ABNORMAL HIGH (ref 0.44–1.00)
GFR calc Af Amer: 46 mL/min — ABNORMAL LOW (ref >60)
GFR calc non Af Amer: 39 mL/min — ABNORMAL LOW (ref >60)
GLUCOSE: 217 mg/dL — AB (ref 70–99)
POTASSIUM: 4.6 mmol/L (ref 3.5–5.1)
SODIUM: 135 mmol/L (ref 135–145)
Total Protein: 6.7 g/dL (ref 6.5–8.1)

## 2018-05-11 LAB — LACTATE DEHYDROGENASE: LDH: 157 U/L (ref 98–192)

## 2018-05-11 LAB — VITAMIN B12: Vitamin B-12: 7214 pg/mL — ABNORMAL HIGH (ref 180–914)

## 2018-05-11 LAB — FERRITIN: Ferritin: 13 ng/mL (ref 11–307)

## 2018-05-11 LAB — FOLATE: Folate: 19.1 ng/mL (ref >5.9)

## 2018-05-11 IMAGING — DX DG SHOULDER 2+V*L*
2 series · 2 of 2 positions shown · non-contrast
Comparison: 07/11/2013

CLINICAL DATA: LEFT shoulder pain, got dizzy and passed out while
walking to the bathroom, fall, history diabetes mellitus,
hypertension, CHF

EXAM:
LEFT SHOULDER - 2+ VIEW

[shoulder grashey]
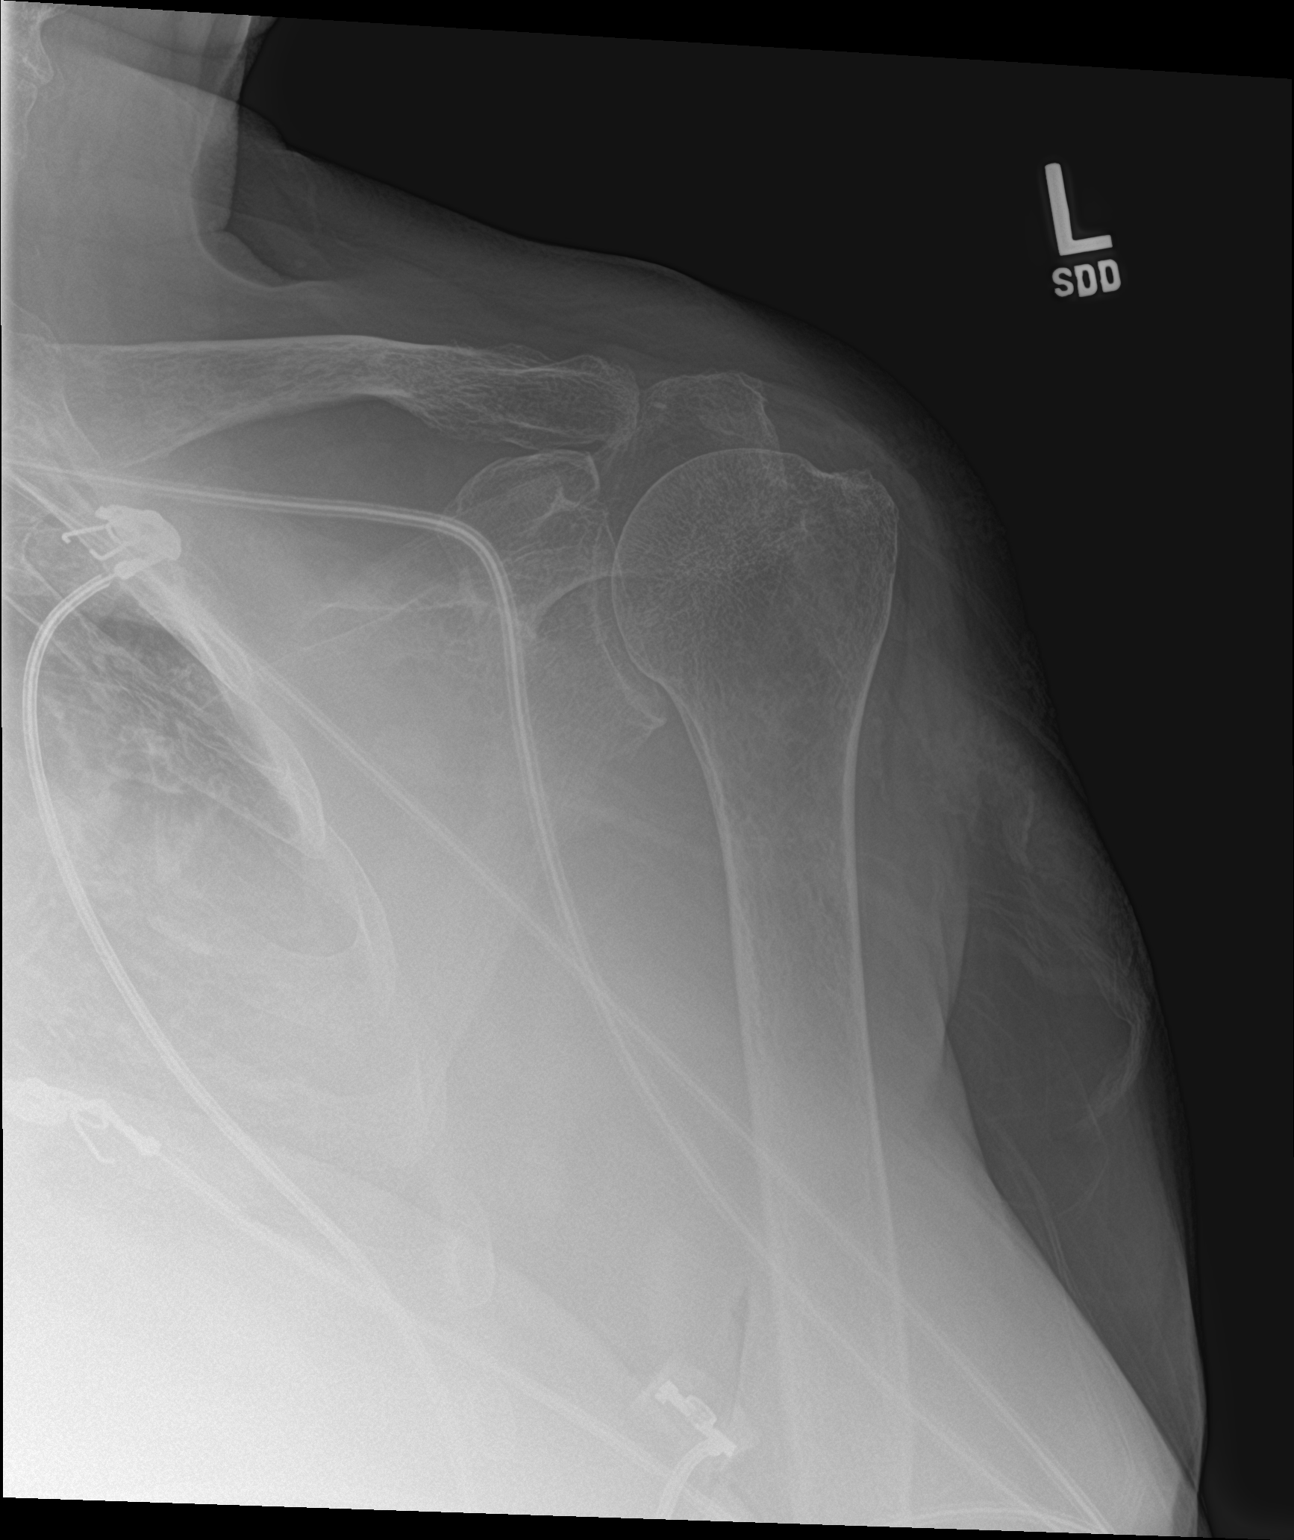

[shoulder y view]
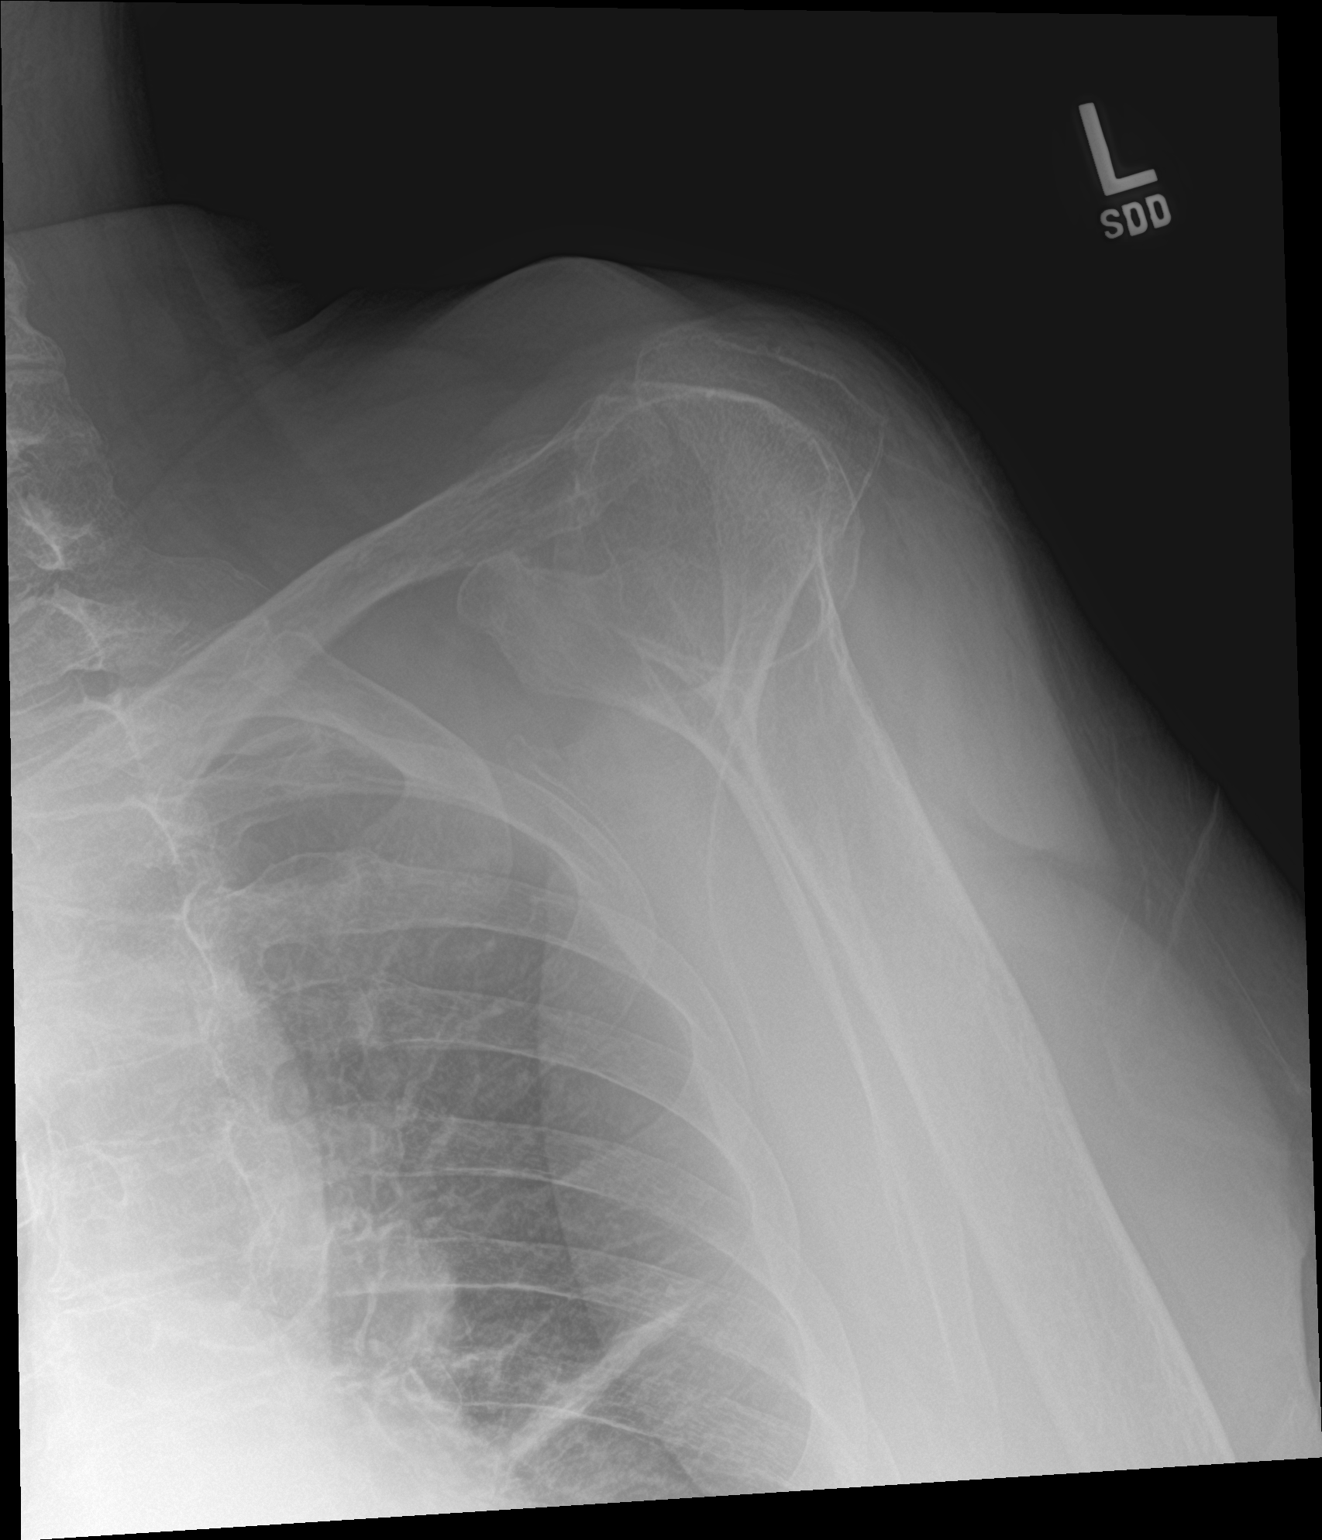

[2 of 2 positions shown; findings below may reference images not displayed]

FINDINGS: Marked osseous demineralization.

AC joint alignment normal.

No acute fracture, dislocation, or bone destruction.

Visualized LEFT ribs grossly intact.

Subsegmental atelectasis lower LEFT lung.
IMPRESSION: Marked osseous demineralization.

No definite acute bony abnormalities.

## 2018-05-11 IMAGING — CT CT HEAD W/O CM
4 of 6 series · 16 of 47 positions shown, 18 images · non-contrast
Comparison: Head CT dated 04/11/2014

CLINICAL DATA: 64-year-old female with fall

EXAM:
CT HEAD WITHOUT CONTRAST
CT CERVICAL SPINE WITHOUT CONTRAST
TECHNIQUE: Multidetector CT imaging of the head and cervical spine was
performed following the standard protocol without intravenous
contrast. Multiplanar CT image reconstructions of the cervical spine
were also generated.

[Series 2: head w/o · axial · non-contrast · 0.40mm/px · z∈[+268,+324]mm · 2 of 42 slices shown]
[im 14/42  brain]
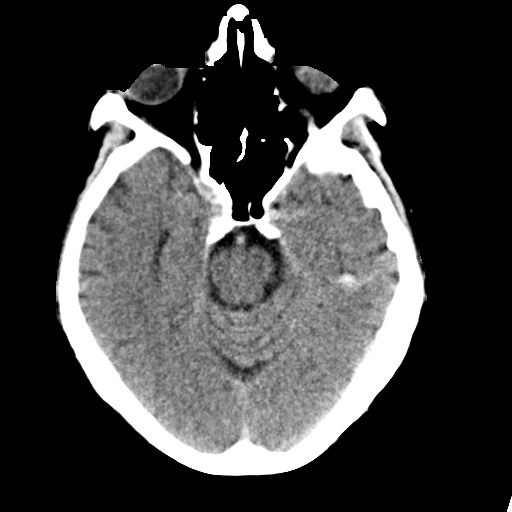
[im 28/42  brain]
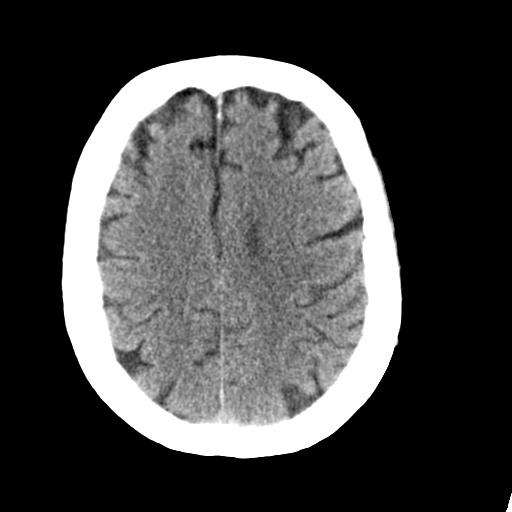

[Series 4: coronal · coronal · 0.29mm/px · 3 of 61 slices shown]
[im 21/61  brain]
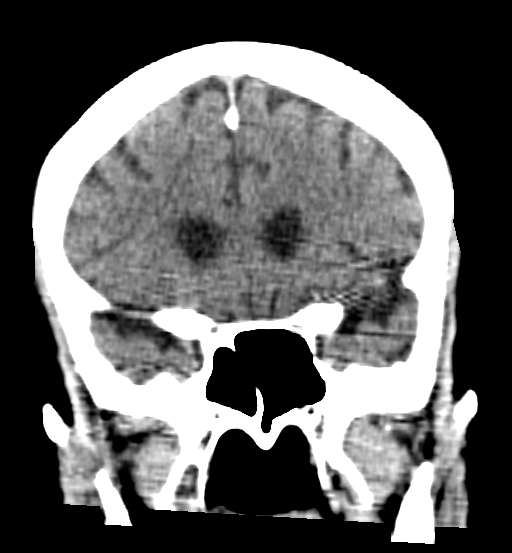
[im 27/61  brain]
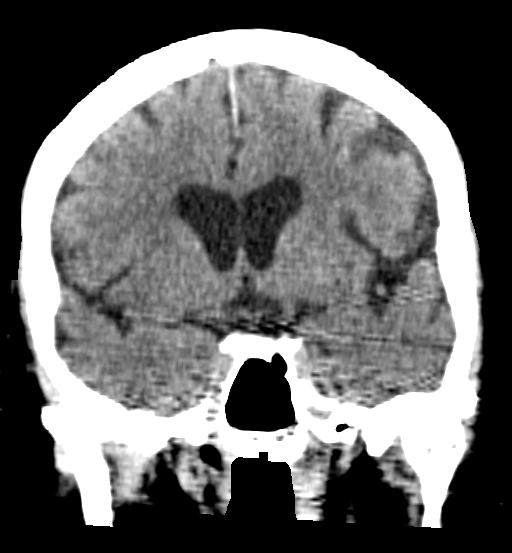
[im 34/61  brain]
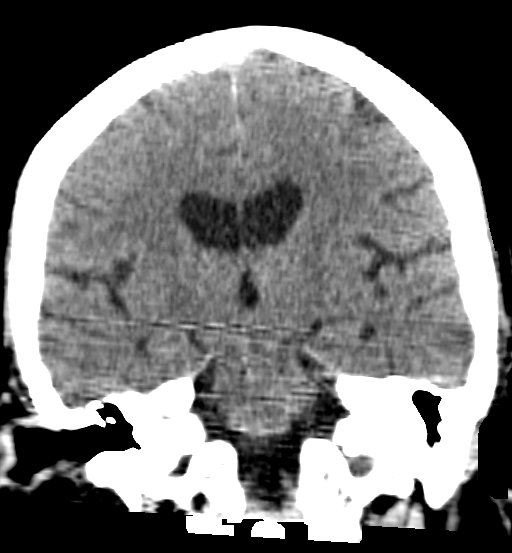

[Series 5: sagittal · sagittal · 0.33mm/px · 3 of 53 slices shown]
[im 18/53  brain]
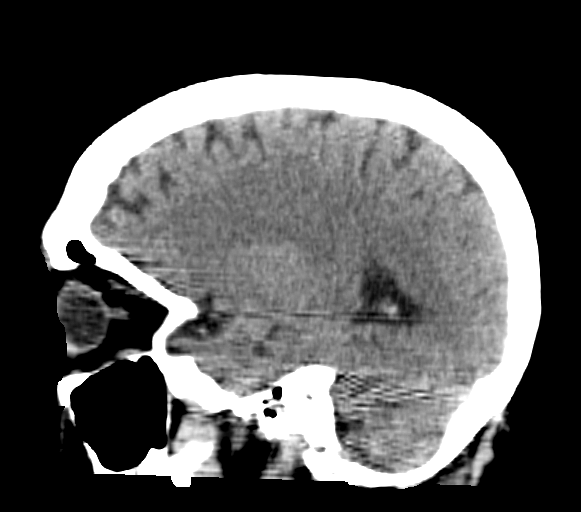
[im 27/53  brain]
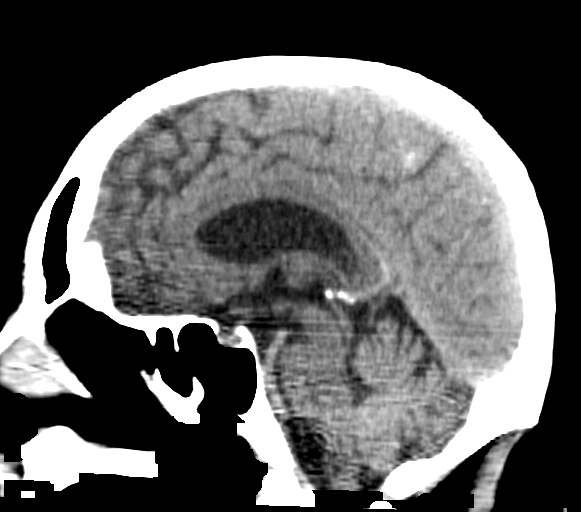
[im 35/53  brain]
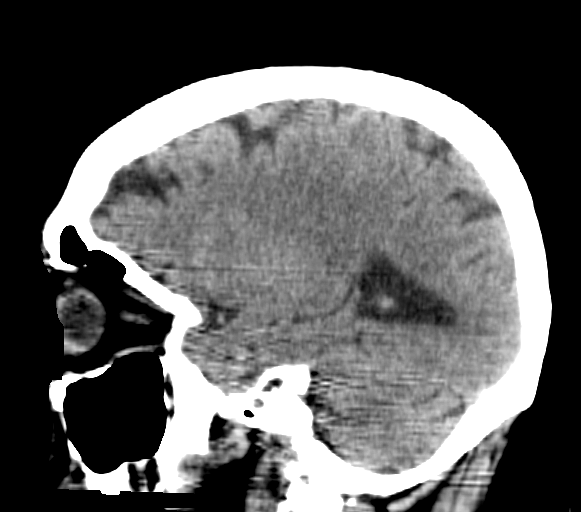

[Series 10: orthogonal axial · axial · 0.25mm/px · z∈[+63,+218]mm · 8 of 105 slices shown, 10 images]
[im 12/105  brain]
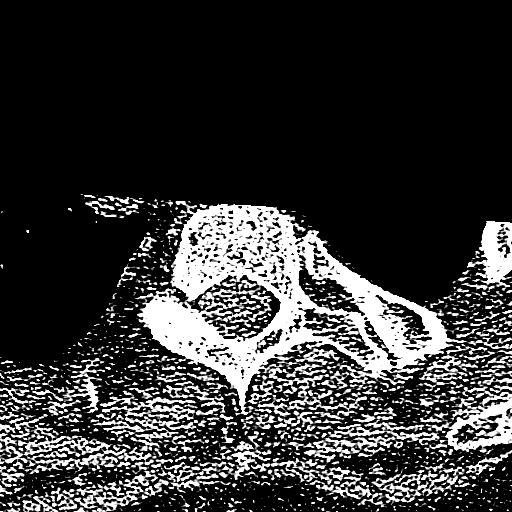
[im 12/105  bone]
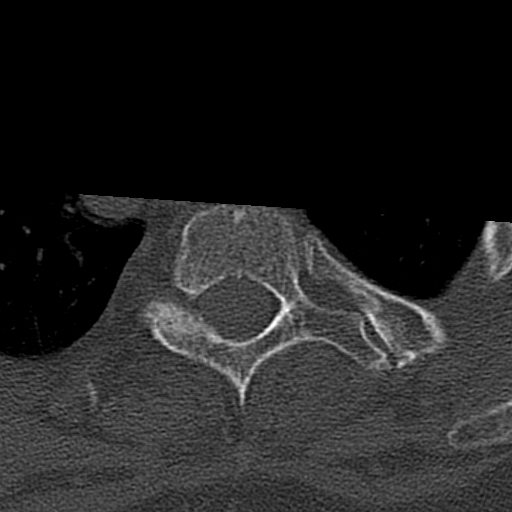
[im 24/105  brain]
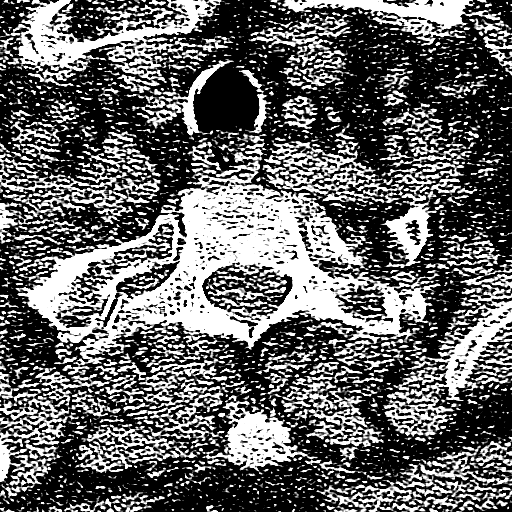
[im 35/105  brain]
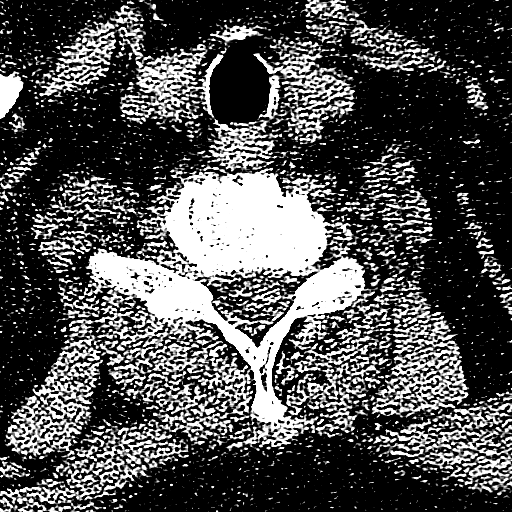
[im 47/105  brain]
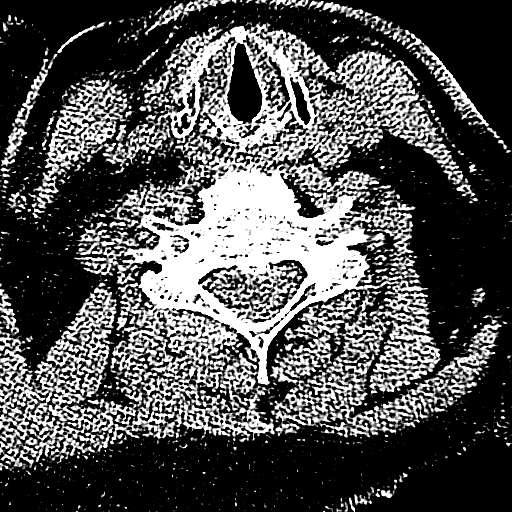
[im 58/105  brain]
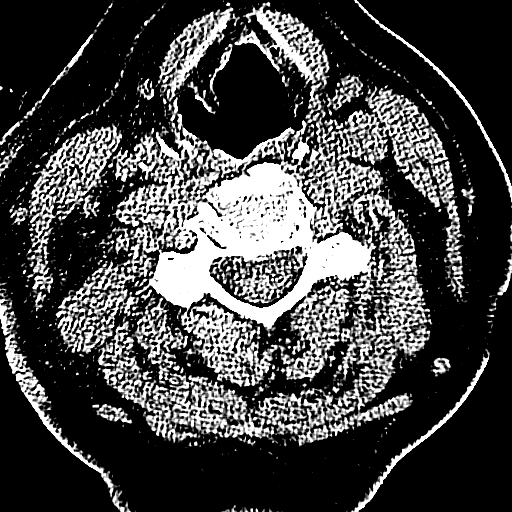
[im 58/105  bone]
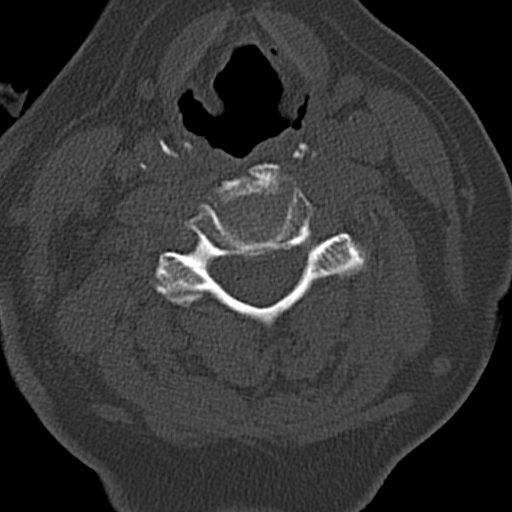
[im 70/105  brain]
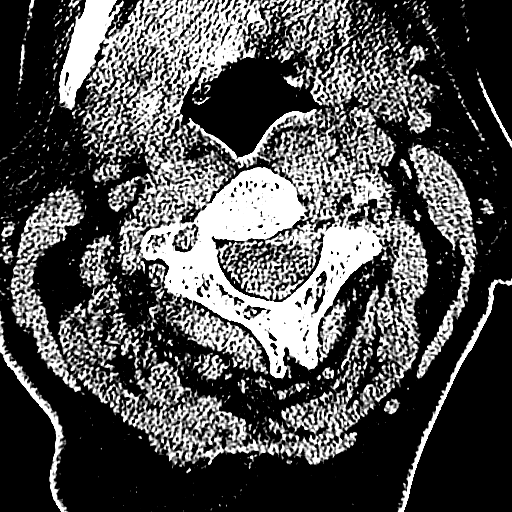
[im 81/105  brain]
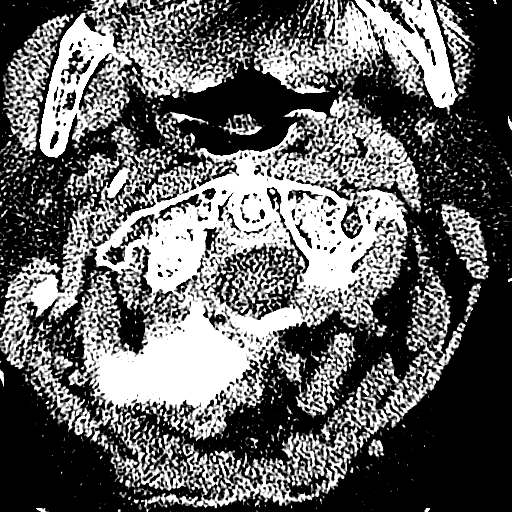
[im 93/105  brain]
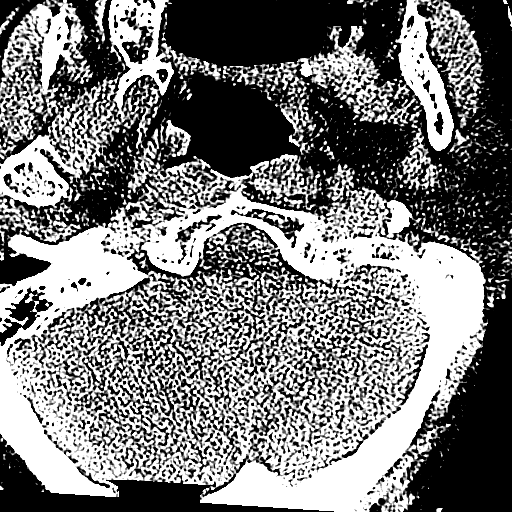

[16 of 47 positions shown; findings below may reference images not displayed]

FINDINGS: CT HEAD FINDINGS

The ventricles and sulci are appropriate size for patient's age.
Minimal periventricular and deep white matter chronic microvascular
ischemic changes noted. There is no acute intracranial hemorrhage.
No mass effect midline shift noted.

The visualized paranasal sinuses and mastoid air cells are clear.
The calvarium is intact.

CT CERVICAL SPINE FINDINGS

There is no acute fracture or subluxation of the cervical
spine.There is osteopenia with multilevel degenerative changes of
the spine in.The odontoid and spinous processes are intact.There is
normal anatomic alignment of the C1-C2 lateral masses. The
visualized soft tissues appear unremarkable.
IMPRESSION: No acute intracranial hemorrhage.

No acute/traumatic cervical spine pathology.

## 2018-05-11 NOTE — Progress Notes (Signed)
Diagnosis Other iron deficiency anemia - Plan: CBC with Differential/Platelet, Comprehensive metabolic panel, Lactate dehydrogenase, Protein electrophoresis, serum, Ferritin, Vitamin B12, Folate, CBC with Differential/Platelet, CANCELED: Comprehensive metabolic panel, CANCELED: Lactate dehydrogenase, CANCELED: Ferritin  Staging Cancer Staging No matching staging information was found for the patient.  Assessment and Plan:  1.  Iron-deficiency anemia.  Pt was last treated with IV iron in 2017.  Labs done 05/11/2018 reviewed and showed WBC 8 Hb 11.6 plts 223,000.  Chemistries WNL with K+ 4.6 Cr 1.34 and normal LFTs.  Ferritin decreased at 13.  Pt has an intolerance to oral iron.  She is set up for Injectafer 750 mg IV D1 and D8.  Side effects of medication reviewed.  Pt will RTC in 06/2018 for follow-up after IV iron.  Pt should follow-up with GI as recommended.  Last colonoscopy was in 2015.    2.  HTN.  BP is 100/68.  Follow-up with PCP for evaluation.    3.  Fatigue.  Will determine if symptoms improve after IV iron.    25 minutes spent with more than 50% spent in counseling and coordination of care.    Current Status:  Pt is seen today for follow-up to go over labs.  She denies any blood in stool or urine.    Problem List Patient Active Problem List   Diagnosis Date Noted  . Chronic respiratory failure with hypoxia and hypercapnia (HCC) [J96.11, J96.12] 12/13/2017  . Acute systolic HF (heart failure) (HCC) [I50.21] 11/26/2017  . Palliative care by specialist [Z51.5]   . Goals of care, counseling/discussion [Z71.89]   . Acute on chronic diastolic CHF (congestive heart failure) ([place]) [I50.33] 08/17/2017  . Acute respiratory failure with hypoxia and hypercapnia (HCC) [J96.01, J96.02] 08/16/2017  . Uncontrolled type 2 diabetes mellitus with hyperglycemia ([place]) [E11.65] 08/16/2017  . Acute respiratory failure with hypercapnia ([place]) [J96.02]   . CAP Failed Outpatient Tx [J18.9] 08/05/2017   . COPD exacerbation ([place]) [J44.1] 08/05/2017  . Pulmonary hypertension ([place]) [I27.20] 04/05/2017  . Derangement of posterior horn of medial meniscus of right knee [M23.321]   . Lateral meniscus, anterior horn derangement, right [M23.341]   . Syncope, near [R55] 12/26/2015  . Fall at home [W19.Merril Abbe, M27.078] 12/26/2015  . Dysphagia [R13.10] 12/07/2015  . IDA (iron deficiency anemia) [D50.9] 03/13/2015  . Chronic diastolic CHF (congestive heart failure) ([place]) [I50.32] 02/20/2015  . Syncope and collapse [R55] 02/05/2015  . Dehydration [E86.0]   . Acute on chronic combined systolic and diastolic CHF (congestive heart failure) ([place]) [I50.43] 01/26/2015  . Low hemoglobin [D64.9]   . Diffuse abdominal pain [R10.84] 09/16/2014  . Ascites [R18.8] 08/27/2014  . Anemia [D64.9] 07/20/2014  . Epistaxis, recurrent [R04.0] 07/20/2014  . Syncope [R55] 04/11/2014  . Dementia ([place]) [F03.90] 04/11/2014  . Chronic diarrhea [K52.9] 04/11/2014  . DM type 2 (diabetes mellitus, type 2) ([place]) [E11.9] 04/11/2014  . Protein-calorie malnutrition, severe ([place]) [E43] 04/11/2014  . Acute CHF ([place]) [I50.9] 12/21/2013  . Atrial fibrillation with RVR ([place]) [I48.91] 12/21/2013  . Chest pain at rest [R07.9] 12/03/2013  . Obesity [E66.9] 12/03/2013  . Chest pain [R07.9] 12/03/2013  . Diarrhea [R19.7] 08/26/2013  . Abnormal weight loss [R63.4] 08/26/2013  . Lower abdominal pain [R10.30] 08/26/2013  . Anorexia nervosa [F50.00] 08/26/2013  . Encounter for therapeutic drug monitoring [Z51.81] 08/26/2013  . DOE (dyspnea on exertion) [R06.09] 06/29/2013  . Tremor [R25.1] 10/27/2012  . Chronic anticoagulation [Z79.01] 06/23/2011  . HYPOTENSION, ORTHOSTATIC [I95.1] 08/26/2009  . Hyperlipidemia [E78.5] 03/04/2009  .  Barrett's esophagus [K22.70] 09/26/2008  . Gastroparesis [K31.84] 09/26/2008  . Iron deficiency anemia [D50.9] 09/25/2008  . Essential hypertension [I10] 09/25/2008  . LUNG NODULE [J98.4] 09/25/2008  .  Irritable bowel syndrome [K58.9] 09/25/2008  . Sleep apnea [G47.30] 09/25/2008  . CARPAL TUNNEL SYNDROME, HX OF [Z86.69] 09/25/2008  . Hypothyroidism [E03.9] 04/10/2008  . DIABETES MELLITUS, TYPE II, ON INSULIN [E11.9] 04/10/2008  . OBESITY [E66.9] 04/10/2008  . Atrial fibrillation ([place]) [I48.91] 04/10/2008  . Congestive heart failure ([place]) [I50.9] 04/10/2008  . LOW BACK PAIN, CHRONIC [M54.5] 04/10/2008  . CHEST PAIN-PRECORDIAL [R07.2] 04/10/2008    Past Medical History Past Medical History:  Diagnosis Date  . Abnormal pulmonary function test   . Anemia    H/H of 10/30 with a normal MCV in 12/09  . Anxiety   . Arthritis   . Barrett's esophagus    Diagnosed 1995. Last EGD 2016-NO BARRETT'S.   . Chest pain    Negative cardiac catheterization in 2002; negative stress nuclear study in 2008  . Chronic anticoagulation   . Chronic combined systolic and diastolic CHF (congestive heart failure) (HCC)    a. EF predominantly normal during prior echoes but was 40% during 10/2014 echo. b. Most recent 01/2015 EF normal, 55-60%.  . Chronic LBP    Surgical intervention in 1996  . Diabetes mellitus, type 2 (HCC)    Insulin therapy; exacerbated by prednisone  . Dysrhythmia    AFib  . Gastroparesis    99% retention 05/2008 on GES  . GERD (gastroesophageal reflux disease)   . Hiatal hernia   . Hyperlipidemia   . Hypertension   . Hypothyroid   . IBS (irritable bowel syndrome)   . Obesity   . OSA on CPAP    had CPAP and cannot tolerate.  . Paroxysmal atrial fibrillation ([place])   . Pulmonary hypertension ([place]) 01/2015   a. Predominantly pulmonary venous hypertension but may be component of PAH.  . Seizures ([place])    last seizure was 2 years ago; on keppra for this; unknown etiology  . Syncope    a. Admitted 05/2009; magnetic resonance imagin/ MRA - negative; etiology thought to be orthostasis secondary to drugs and dehydration. b. Syncope 02/2015 also felt 2/2 dehydration.    Past Surgical  History Past Surgical History:  Procedure Laterality Date  . BACK SURGERY  1996  . BIOPSY N/A 11/08/2013   Procedure: BIOPSY  / Tissue sampling / ulcers present in small intestine;  Surgeon: Danie Binder, MD;  [facility];  Service: Endoscopy;  Laterality: N/A;  . CARDIAC CATHETERIZATION  2002  . CARDIAC CATHETERIZATION N/A 01/26/2015   Procedure: Right Heart Cath;  Surgeon: Larey Dresser, MD;  Location: [facility];  Service: Cardiovascular;  Laterality: N/A;  . CARDIOVASCULAR STRESS TEST  2008   Stress nuclear study  . CARDIOVERSION N/A 03/06/2015   Procedure: CARDIOVERSION;  Surgeon: Larey Dresser, MD;  Location: [facility];  Service: Cardiovascular;  Laterality: N/A;  . CARPAL [place]  . COLONOSCOPY  11/2011   Dr. Oneida Alar: Internal hemorrhoids, mild diverticulosis. Random colon biopsies negative.  . COLONOSCOPY N/A 11/08/2013   SLF: Normal mucosa in the terminal ileum/The colon IS redundant/  Moderate diverticulosis throughout the entire colon. ileum bx benign. colon bx benign  . ESOPHAGOGASTRODUODENOSCOPY  2008   Barrett's without dysplasia. esphagus dilated. antral erosions, h.pylori serologies negative.  . ESOPHAGOGASTRODUODENOSCOPY  11/2011   Dr. Oneida Alar: Barrett's esophagus, mild gastritis, diverticulum in the second  portion of the duodenum repeat EGD 3 years. Small bowel biopsies negative. Gastric biopsy show reactive gastropathy but no H. pylori. Esophageal biopsies consistent with GERD. Next EGD 11/2014  . ESOPHAGOGASTRODUODENOSCOPY N/A 11/21/2014   OZY:YQMG non-erosive gastritis/irregular z-line  . GIVENS CAPSULE STUDY  12/07/2011   Proximal small bowel, rare AVM. Distal small bowel, multiple ulcers noted  . GIVENS CAPSULE STUDY N/A 09/27/2013   Distal small bowel ulcers extending to TI.  Marland Kitchen GIVENS CAPSULE STUDY N/A 10/10/2013   Procedure: GIVENS CAPSULE STUDY;  Surgeon: Danie Binder, MD;  [facility];  Service: Endoscopy;  Laterality: N/A;   7:30  . IR KYPHO THORACIC WITH BONE BIOPSY  02/09/2018  . KNEE ARTHROSCOPY WITH MEDIAL MENISECTOMY Right 06/09/2016   Procedure: KNEE ARTHROSCOPY WITH MEDIAL MENISECTOMY;  Surgeon: Carole Civil, MD;  Location: [facility];  Service: Orthopedics;  Laterality: Right;  medial and lateral menisectomy  . LAMINECTOMY  1995   L4-L5  . LAPAROSCOPIC CHOLECYSTECTOMY  1990s  . LEFT HEART CATHETERIZATION WITH CORONARY ANGIOGRAM  01/10/2014   Procedure: LEFT HEART CATHETERIZATION WITH CORONARY ANGIOGRAM;  Surgeon: Sinclair Grooms, MD;  Location: [facility] CATH LAB;  Service: Cardiovascular;;  . RIGHT HEART CATHETERIZATION N/A 01/10/2014   Procedure: RIGHT HEART CATH;  Surgeon: Sinclair Grooms, MD;  Location[facility] CATH LAB;  Service: Cardiovascular;  Laterality: N/A;  . TOTAL ABDOMINAL HYSTERECTOMY  1999  . TRACHEOSTOMY TUBE PLACEMENT N/A 09/01/2017   Procedure: TRACHEOSTOMY;  Surgeon: Rozetta Nunnery, MD;  Location: [facility] OR;  Service: ENT;  Laterality: N/A;    Family History Family History  Problem Relation Age of Onset  . Hypertension Mother   . Alzheimer's disease Mother   . Stroke Mother   . Heart attack Mother   . Hypertension Other   . Breast cancer Sister   . Heart disease Neg Hx   . Colon cancer Neg Hx      Social History  reports that she quit smoking about 34 years ago. Her smoking use included cigarettes. She started smoking about 52 years ago. She has a 3.75 pack-year smoking history. She has never used smokeless tobacco. She reports that she does not drink alcohol or use drugs.  Medications  Current Outpatient Medications:  .  acetaminophen (TYLENOL) 500 MG tablet, Take 500 mg by mouth every 6 (six) hours as needed for moderate pain or headache., Disp: , Rfl:  .  amiodarone (PACERONE) 200 MG tablet, TAKE ONE TABLET BY MOUTH EVERY MORNING, Disp: 90 tablet, Rfl: 3 .  apixaban (ELIQUIS) 5 MG TABS tablet, Take 1 tablet (5 mg total) by mouth 2 (two) times daily., Disp: 60 tablet, Rfl: 0 .   Cholecalciferol (VITAMIN D3) 2000 units capsule, Take 2,000 Units by mouth daily., Disp: , Rfl: 11 .  dicyclomine (BENTYL) 10 MG capsule, Take 2 capsules (20 mg total) by mouth 2 (two) times daily., Disp: 360 capsule, Rfl: 3 .  DULoxetine (CYMBALTA) 30 MG capsule, Take 30 mg by mouth 2 (two) times daily., Disp: , Rfl: 0 .  gabapentin (NEURONTIN) 400 MG capsule, Take 400-800 mg by mouth 3 (three) times daily. Take 1 capsule(446m) by mouth twice a day and take 2 capsules(8037m by mouth at bedtime, Disp: , Rfl: 11 .  glipiZIDE (GLUCOTROL) 10 MG tablet, Take 10 mg by mouth 2 (two) times daily., Disp: , Rfl: 11 .  insulin aspart (NOVOLOG) 100 UNIT/ML injection, Inject 0-20 Units into the skin every 4 (four) hours. CBG 0-120--no units;  121-150--3 units; 151-200--4 units; 201-250--7units; 251-300--11 units; 301-350--15 units; 351-400--20 units, Disp: 10 mL, Rfl: 0 .  insulin glargine (LANTUS) 100 UNIT/ML injection, Inject 0.55 mLs (55 Units total) into the skin at bedtime., Disp: 10 mL, Rfl: 0 .  levETIRAcetam (KEPPRA) 1000 MG tablet, Take 1,000 mg by mouth 2 (two) times daily., Disp: , Rfl: 1 .  levothyroxine (SYNTHROID, LEVOTHROID) 137 MCG tablet, Take 137 mcg by mouth daily., Disp: , Rfl:  .  loperamide (IMODIUM) 2 MG capsule, TAKE ONE CAPSULE BY MOUTH TWICE DAILY AS NEEDED, Disp: 60 capsule, Rfl: 3 .  LORazepam (ATIVAN) 1 MG tablet, Take 1 mg by mouth 3 (three) times daily as needed. for anxiety, Disp: , Rfl: 5 .  magnesium oxide (MAG-OX) 400 MG tablet, Take 1 tablet (400 mg total) by mouth 2 (two) times daily., Disp: 180 tablet, Rfl: 1 .  metFORMIN (GLUCOPHAGE) 500 MG tablet, Take 500 mg by mouth 2 (two) times daily with a meal., Disp: , Rfl:  .  Multiple Vitamin (MULTIVITAMIN WITH MINERALS) TABS tablet, Take 1 tablet by mouth daily., Disp: , Rfl:  .  naproxen sodium (ALEVE) 220 MG tablet, Take 440 mg by mouth as needed., Disp: , Rfl:  .  nystatin (MYCOSTATIN/NYSTOP) powder, Apply topically 2 (two)  times daily. To affected area. Keep dry. (Patient taking differently: Apply 1 Bottle topically 2 (two) times daily as needed. To affected area. Keep dry.), Disp: 15 g, Rfl: 0 .  ondansetron (ZOFRAN) 4 MG tablet, TAKE 1 TABLET BY MOUTH FOUR TIMES DAILY AS NEEDED FOR NAUSEA, Disp: 120 tablet, Rfl: 1 .  OXYGEN, 2lpm with sleep and as needed during the day, Disp: , Rfl:  .  pantoprazole (PROTONIX) 40 MG tablet, TAKE ONE TABLET BY MOUTH TWICE DAILY BEFORE APPLY MEAL, Disp: 180 tablet, Rfl: 3 .  potassium chloride (K-DUR,KLOR-CON) 10 MEQ tablet, TAKE TWO TABLETS BY MOUTH TWICE DAILY, Disp: 120 tablet, Rfl: 3 .  torsemide (DEMADEX) 20 MG tablet, TAKE TWO TABLETS BY MOUTH IN THE MORNING AND TAKE 1 TABLET IN THE EVENING EVERY DAY (MAY TAKE ADDITIONAL TABLET AS NEEDED FOR SWELLING, Disp: 120 tablet, Rfl: 3 .  traMADol (ULTRAM) 50 MG tablet, Take 50 mg by mouth 2 (two) times daily as needed., Disp: , Rfl:   Allergies Celexa [citalopram hydrobromide]; Codeine; Dilaudid [hydromorphone hcl]; and Reglan [metoclopramide]  Review of Systems Review of Systems - Oncology ROS negative other than fatigue   Physical Exam  Vitals Wt Readings from Last 3 Encounters:  05/11/18 244 lb 5 oz (110.8 kg)  04/25/18 240 lb (108.9 kg)  03/02/18 239 lb (108.4 kg)   Temp Readings from Last 3 Encounters:  05/11/18 99.7 F (37.6 C) (Oral)  04/25/18 97.9 F (36.6 C) (Oral)  02/09/18 98 F (36.7 C) (Oral)   BP Readings from Last 3 Encounters:  05/11/18 100/68  04/25/18 119/69  03/02/18 136/78   Pulse Readings from Last 3 Encounters:  05/11/18 69  04/25/18 76  03/02/18 76   Constitutional: Well-developed, well-nourished, and in no distress.   HENT: Head: Normocephalic and atraumatic.  Mouth/Throat: No oropharyngeal exudate. Mucosa moist. Eyes: Pupils are equal, round, and reactive to light. Conjunctivae are normal. No scleral icterus.  Neck: Normal range of motion. Neck supple. No JVD present.   Cardiovascular: Normal rate, regular rhythm and normal heart sounds.  Exam reveals no gallop and no friction rub.   No murmur heard. Pulmonary/Chest: Effort normal and breath sounds normal. No respiratory distress. No wheezes.No rales.  Abdominal:  Soft. Bowel sounds are normal. No distension. There is no tenderness. There is no guarding.  Musculoskeletal: No edema or tenderness.  Lymphadenopathy: No cervical, axillary or supraclavicular adenopathy.  Neurological: Alert and oriented to person, place, and time. No cranial nerve deficit.  Skin: Skin is warm and dry. No rash noted. No erythema. No pallor.  Psychiatric: Affect and judgment normal.   Labs Appointment on 05/11/2018  Component Date Value Ref Range Status  . WBC 05/11/2018 8.0  4.0 - 10.5 K/uL Final  . RBC 05/11/2018 4.01  3.87 - 5.11 MIL/uL Final  . Hemoglobin 05/11/2018 11.6* 12.0 - 15.0 g/dL Final  . HCT 05/11/2018 38.2  36.0 - 46.0 % Final  . MCV 05/11/2018 95.3  80.0 - 100.0 fL Final  . MCH 05/11/2018 28.9  26.0 - 34.0 pg Final  . MCHC 05/11/2018 30.4  30.0 - 36.0 g/dL Final  . RDW 05/11/2018 15.1  11.5 - 15.5 % Final  . Platelets 05/11/2018 223  150 - 400 K/uL Final  . nRBC 05/11/2018 0.0  0.0 - 0.2 % Final  . Neutrophils Relative % 05/11/2018 56  % Final  . Neutro Abs 05/11/2018 4.5  1.7 - 7.7 K/uL Final  . Lymphocytes Relative 05/11/2018 33  % Final  . Lymphs Abs 05/11/2018 2.6  0.7 - 4.0 K/uL Final  . Monocytes Relative 05/11/2018 8  % Final  . Monocytes Absolute 05/11/2018 0.6  0.1 - 1.0 K/uL Final  . Eosinophils Relative 05/11/2018 1  % Final  . Eosinophils Absolute 05/11/2018 0.1  0.0 - 0.5 K/uL Final  . Basophils Relative 05/11/2018 1  % Final  . Basophils Absolute 05/11/2018 0.1  0.0 - 0.1 K/uL Final  . Immature Granulocytes 05/11/2018 1  % Final  . Abs Immature Granulocytes 05/11/2018 0.07  0.00 - 0.07 K/uL Final   Performed at [facility], [address], [place], [place] [address]  . Sodium 05/11/2018  135  135 - 145 mmol/L Final  . Potassium 05/11/2018 4.6  3.5 - 5.1 mmol/L Final  . Chloride 05/11/2018 101  98 - 111 mmol/L Final  . CO2 05/11/2018 26  22 - 32 mmol/L Final  . Glucose, Bld 05/11/2018 217* 70 - 99 mg/dL Final  . BUN 05/11/2018 27* 8 - 23 mg/dL Final  . Creatinine, Ser 05/11/2018 1.34* 0.44 - 1.00 mg/dL Final  . Calcium 05/11/2018 8.9  8.9 - 10.3 mg/dL Final  . Total Protein 05/11/2018 6.7  6.5 - 8.1 g/dL Final  . Albumin 05/11/2018 3.6  3.5 - 5.0 g/dL Final  . AST 05/11/2018 18  15 - 41 U/L Final  . ALT 05/11/2018 14  0 - 44 U/L Final  . Alkaline Phosphatase 05/11/2018 70  38 - 126 U/L Final  . Total Bilirubin 05/11/2018 0.5  0.3 - 1.2 mg/dL Final  . GFR calc non Af Amer 05/11/2018 39* >60 mL/min Final  . GFR calc Af Amer 05/11/2018 46* >60 mL/min Final   Comment: (NOTE) The eGFR has been calculated using the CKD EPI equation. This calculation has not been validated in all clinical situations. eGFR's persistently <60 mL/min signify possible Chronic Kidney Disease.   Georgiann Hahn gap 05/11/2018 8  5 - 15 Final   Performed at [facility], [address], [place], [place] [address]  . LDH 05/11/2018 157  98 - 192 U/L Final   Performed at [facility], [address], [place], [place] [address]  . Folate 05/11/2018 19.1  >5.9 ng/mL Final   Performed  at [facility], [address] [place], [place] [address]     Pathology Orders Placed This Encounter  Procedures  . CBC with Differential/Platelet    Standing Status:   Future    Number of Occurrences:   1    Standing Expiration Date:   05/12/2019  . Comprehensive metabolic panel    Standing Status:   Future    Number of Occurrences:   1    Standing Expiration Date:   05/12/2019  . Lactate dehydrogenase    Standing Status:   Future    Number of Occurrences:   1    Standing Expiration Date:   05/12/2019  . Protein electrophoresis, serum    Standing Status:   Future    Number of Occurrences:   1    Standing Expiration  Date:   05/12/2019  . Ferritin    Standing Status:   Future    Number of Occurrences:   1    Standing Expiration Date:   05/12/2019  . Vitamin B12    Standing Status:   Future    Number of Occurrences:   1    Standing Expiration Date:   05/12/2019  . Folate    Standing Status:   Future    Number of Occurrences:   1    Standing Expiration Date:   05/12/2019  . CBC with Differential/Platelet    Standing Status:   Future    Number of Occurrences:   1    Standing Expiration Date:   05/11/2020       Zoila Shutter MD

## 2018-05-15 ENCOUNTER — Encounter (HOSPITAL_COMMUNITY): Payer: Self-pay

## 2018-05-15 ENCOUNTER — Inpatient Hospital Stay (HOSPITAL_COMMUNITY): Payer: Medicare Other

## 2018-05-15 VITALS — BP 118/49 | HR 56 | Temp 97.8°F | Resp 18

## 2018-05-15 DIAGNOSIS — D509 Iron deficiency anemia, unspecified: Secondary | ICD-10-CM | POA: Diagnosis not present

## 2018-05-15 DIAGNOSIS — D508 Other iron deficiency anemias: Secondary | ICD-10-CM

## 2018-05-15 DIAGNOSIS — D500 Iron deficiency anemia secondary to blood loss (chronic): Secondary | ICD-10-CM

## 2018-05-15 MED ORDER — SODIUM CHLORIDE 0.9 % IV SOLN
750.0000 mg | Freq: Once | INTRAVENOUS | Status: AC
  Administered 2018-05-15: 750 mg via INTRAVENOUS
  Filled 2018-05-15: qty 15

## 2018-05-15 MED ORDER — SODIUM CHLORIDE 0.9 % IV SOLN
Freq: Once | INTRAVENOUS | Status: AC
  Administered 2018-05-15: 14:00:00 via INTRAVENOUS

## 2018-05-15 NOTE — Progress Notes (Signed)
[name] tolerated Injectafer infusion well without complaints or incident. Peripheral IV site with positive blood return noted prior to and after this infusion. VSS after infusion completed. Pt discharged via wheelchair in satisfactory condition accompanied by family member

## 2018-05-15 NOTE — Patient Instructions (Signed)
[facility] at [facility] Discharge Instructions  Received Injectafer infusion today. Follow-up as scheduled. Call clinic for any questions or concerns   Thank you for choosing [facility] at [facility] to provide your oncology and hematology care.  To afford each patient quality time with our provider, please arrive at least 15 minutes before your scheduled appointment time.   If you have a lab appointment with the Cancer Center please come in thru the  Main Entrance and check in at the main information desk  You need to re-schedule your appointment should you arrive 10 or more minutes late.  We strive to give you quality time with our providers, and arriving late affects you and other patients whose appointments are after yours.  Also, if you no show three or more times for appointments you may be dismissed from the clinic at the providers discretion.     Again, thank you for choosing [facility].  Our hope is that these requests will decrease the amount of time that you wait before being seen by our physicians.       _____________________________________________________________  Should you have questions after your visit to [facility], please contact our office at [phone] between the hours of 8:00 a.m. and 4:30 p.m.  Voicemails left after 4:00 p.m. will not be returned until the following business day.  For prescription refill requests, have your pharmacy contact our office and allow 72 hours.    Cancer Center Support Programs:   > Cancer Support Group  2nd Tuesday of the month 1pm-2pm, Journey Room   

## 2018-05-16 LAB — PROTEIN ELECTROPHORESIS, SERUM
A/G Ratio: 1.1 (ref 0.7–1.7)
ALPHA-1-GLOBULIN: 0.2 g/dL (ref 0.0–0.4)
Albumin ELP: 3.4 g/dL (ref 2.9–4.4)
Alpha-2-Globulin: 1 g/dL (ref 0.4–1.0)
Beta Globulin: 1 g/dL (ref 0.7–1.3)
GAMMA GLOBULIN: 0.9 g/dL (ref 0.4–1.8)
GLOBULIN, TOTAL: 3.1 g/dL (ref 2.2–3.9)
TOTAL PROTEIN ELP: 6.5 g/dL (ref 6.0–8.5)

## 2018-05-22 ENCOUNTER — Encounter (HOSPITAL_COMMUNITY): Payer: Self-pay

## 2018-05-22 ENCOUNTER — Inpatient Hospital Stay (HOSPITAL_COMMUNITY): Payer: Medicare Other

## 2018-05-22 VITALS — BP 124/45 | HR 56 | Temp 98.2°F | Resp 18

## 2018-05-22 DIAGNOSIS — D508 Other iron deficiency anemias: Secondary | ICD-10-CM

## 2018-05-22 DIAGNOSIS — D509 Iron deficiency anemia, unspecified: Secondary | ICD-10-CM | POA: Diagnosis not present

## 2018-05-22 DIAGNOSIS — D500 Iron deficiency anemia secondary to blood loss (chronic): Secondary | ICD-10-CM

## 2018-05-22 MED ORDER — SODIUM CHLORIDE 0.9 % IV SOLN
750.0000 mg | Freq: Once | INTRAVENOUS | Status: AC
  Administered 2018-05-22: 750 mg via INTRAVENOUS
  Filled 2018-05-22: qty 15

## 2018-05-22 MED ORDER — SODIUM CHLORIDE 0.9 % IV SOLN
Freq: Once | INTRAVENOUS | Status: AC
  Administered 2018-05-22: 15:00:00 via INTRAVENOUS

## 2018-05-22 NOTE — Progress Notes (Signed)
[name] tolerated Injecater infusion well without complaints or incident. VSS upon discharge. Pt discharged via wheelchair in satisfactory condition accompanied by family member

## 2018-05-22 NOTE — Patient Instructions (Signed)
[facility] at [facility] Discharge Instructions  Received Injectafer infusion today. Follow-up as scheduled. Call clinic for any questions or concerns   Thank you for choosing [facility] at [facility] to provide your oncology and hematology care.  To afford each patient quality time with our provider, please arrive at least 15 minutes before your scheduled appointment time.   If you have a lab appointment with the Cancer Center please come in thru the  Main Entrance and check in at the main information desk  You need to re-schedule your appointment should you arrive 10 or more minutes late.  We strive to give you quality time with our providers, and arriving late affects you and other patients whose appointments are after yours.  Also, if you no show three or more times for appointments you may be dismissed from the clinic at the providers discretion.     Again, thank you for choosing [facility].  Our hope is that these requests will decrease the amount of time that you wait before being seen by our physicians.       _____________________________________________________________  Should you have questions after your visit to [facility], please contact our office at [phone] between the hours of 8:00 a.m. and 4:30 p.m.  Voicemails left after 4:00 p.m. will not be returned until the following business day.  For prescription refill requests, have your pharmacy contact our office and allow 72 hours.    Cancer Center Support Programs:   > Cancer Support Group  2nd Tuesday of the month 1pm-2pm, Journey Room   

## 2018-05-24 NOTE — Progress Notes (Signed)
REVIEWED-NO ADDITIONAL RECOMMENDATIONS. 

## 2018-06-19 ENCOUNTER — Other Ambulatory Visit: Payer: Self-pay | Admitting: Gastroenterology

## 2018-07-02 ENCOUNTER — Ambulatory Visit (HOSPITAL_COMMUNITY)
Admission: RE | Admit: 2018-07-02 | Discharge: 2018-07-02 | Disposition: A | Discharge status: Home / Self Care | Payer: Medicare Other | Source: Ambulatory Visit | Attending: Family Medicine | Admitting: Family Medicine

## 2018-07-02 ENCOUNTER — Other Ambulatory Visit (HOSPITAL_COMMUNITY): Payer: Self-pay | Admitting: Family Medicine

## 2018-07-02 DIAGNOSIS — M25561 Pain in right knee: Secondary | ICD-10-CM

## 2018-07-02 DIAGNOSIS — M5440 Lumbago with sciatica, unspecified side: Secondary | ICD-10-CM

## 2018-07-02 DIAGNOSIS — M25562 Pain in left knee: Secondary | ICD-10-CM | POA: Diagnosis present

## 2018-07-09 DIAGNOSIS — M5441 Lumbago with sciatica, right side: Secondary | ICD-10-CM | POA: Diagnosis not present

## 2018-07-09 DIAGNOSIS — M25561 Pain in right knee: Secondary | ICD-10-CM | POA: Diagnosis not present

## 2018-07-09 DIAGNOSIS — M5442 Lumbago with sciatica, left side: Secondary | ICD-10-CM | POA: Diagnosis not present

## 2018-07-11 ENCOUNTER — Other Ambulatory Visit (HOSPITAL_COMMUNITY): Payer: Self-pay | Admitting: Family Medicine

## 2018-07-11 ENCOUNTER — Other Ambulatory Visit: Payer: Self-pay | Admitting: Family Medicine

## 2018-07-11 DIAGNOSIS — M5441 Lumbago with sciatica, right side: Secondary | ICD-10-CM

## 2018-07-18 ENCOUNTER — Ambulatory Visit (HOSPITAL_COMMUNITY): Payer: Medicare Other

## 2018-07-27 ENCOUNTER — Ambulatory Visit (HOSPITAL_COMMUNITY)
Admission: RE | Admit: 2018-07-27 | Discharge: 2018-07-27 | Disposition: A | Discharge status: Home / Self Care | Payer: PPO | Source: Ambulatory Visit | Attending: Family Medicine | Admitting: Family Medicine

## 2018-07-27 DIAGNOSIS — M5127 Other intervertebral disc displacement, lumbosacral region: Secondary | ICD-10-CM | POA: Diagnosis not present

## 2018-07-27 DIAGNOSIS — M5441 Lumbago with sciatica, right side: Secondary | ICD-10-CM | POA: Insufficient documentation

## 2018-08-03 IMAGING — MR MR KNEE*R* W/O CM
4 of 6 series · 17 of 40 positions shown · non-contrast
Comparison: Radiographs from 03/02/2016

CLINICAL DATA: Right knee pain after fall at home in the kitchen 1
year ago.

EXAM:
MRI OF THE RIGHT KNEE WITHOUT CONTRAST
TECHNIQUE: Multiplanar, multisequence MR imaging of the knee was performed. No
intravenous contrast was administered.

[Series 3: pdfs axial · axial · 3.0mm · 0.20mm/px · z∈[-62,+24]mm · 5 of 30 slices shown]
[im 1/30]
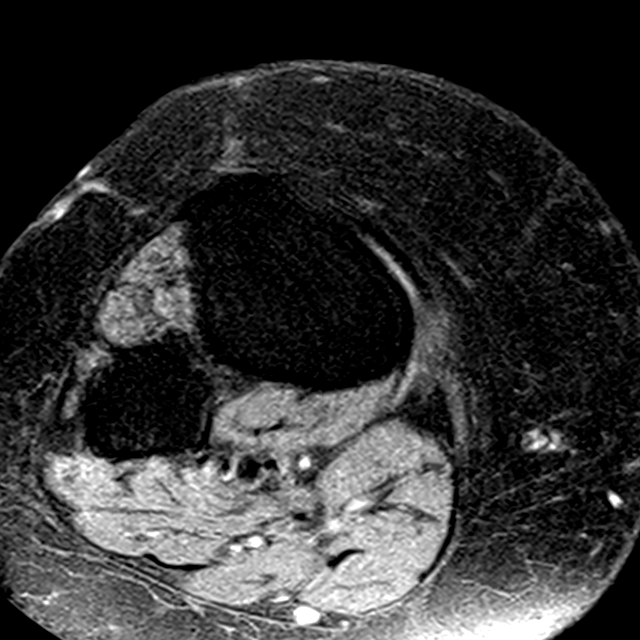
[im 5/30]
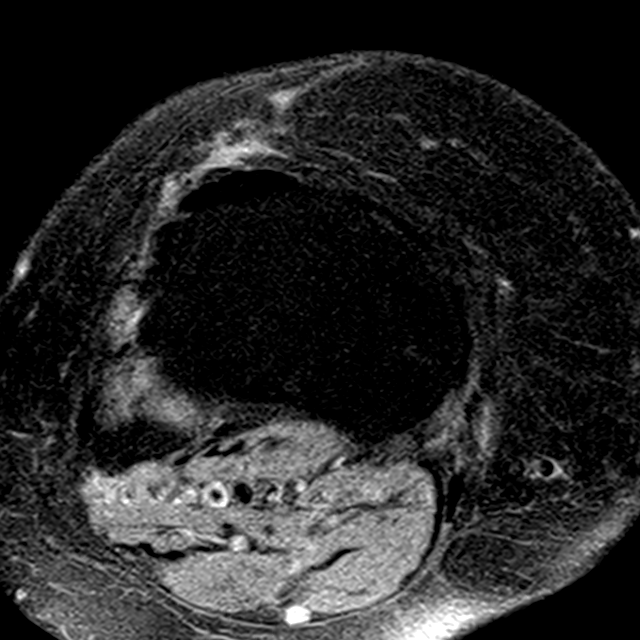
[im 9/30]
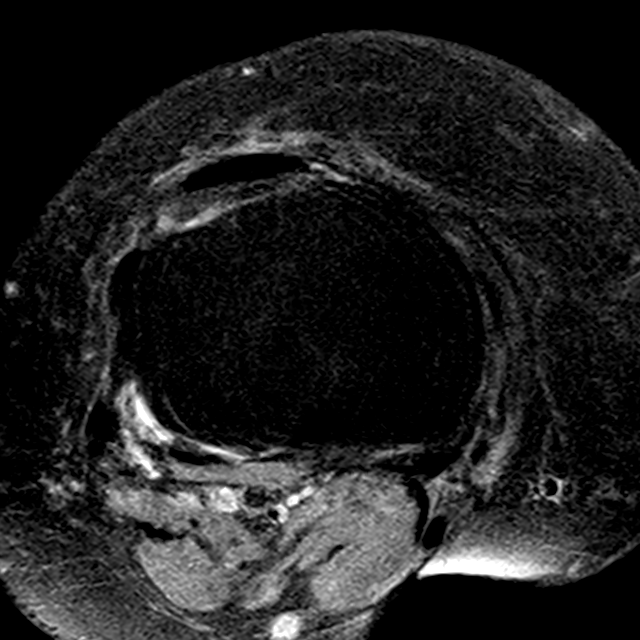
[im 17/30]
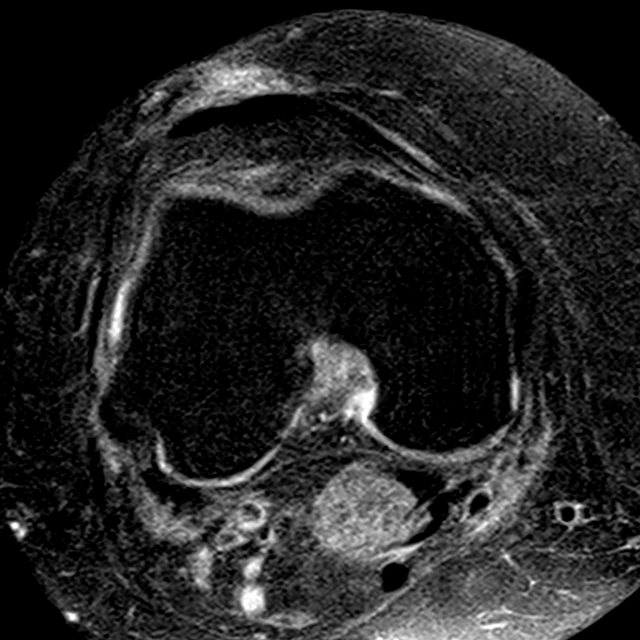
[im 25/30]
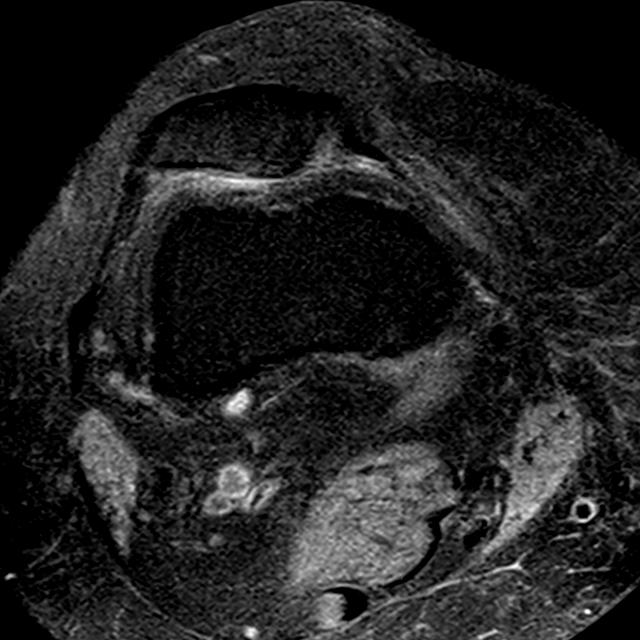

[Series 4: T1 · coronal · 3.0mm · 0.21mm/px · 6 of 24 slices shown]
[im 1/24]
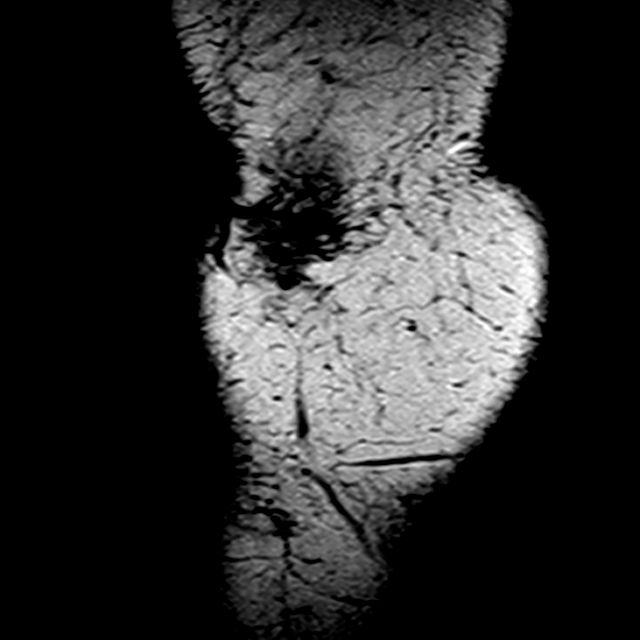
[im 5/24]
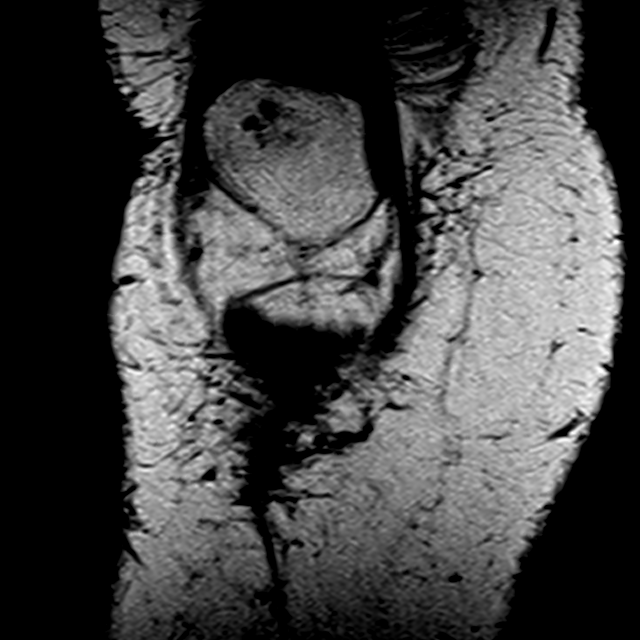
[im 10/24]
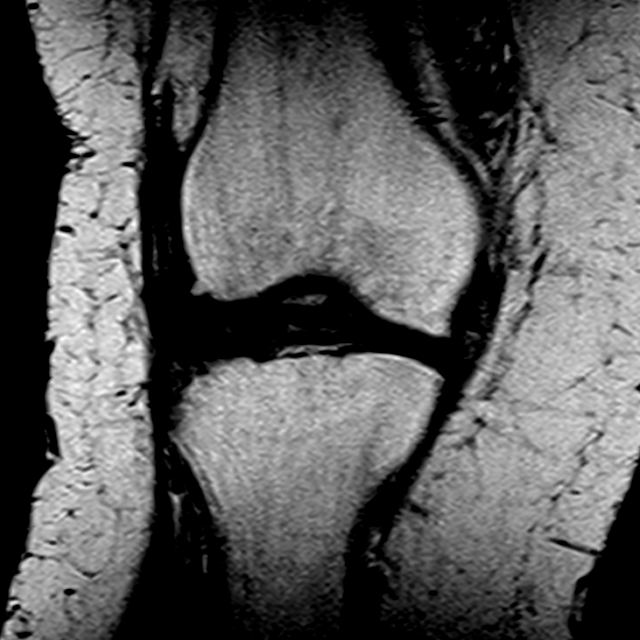
[im 14/24]
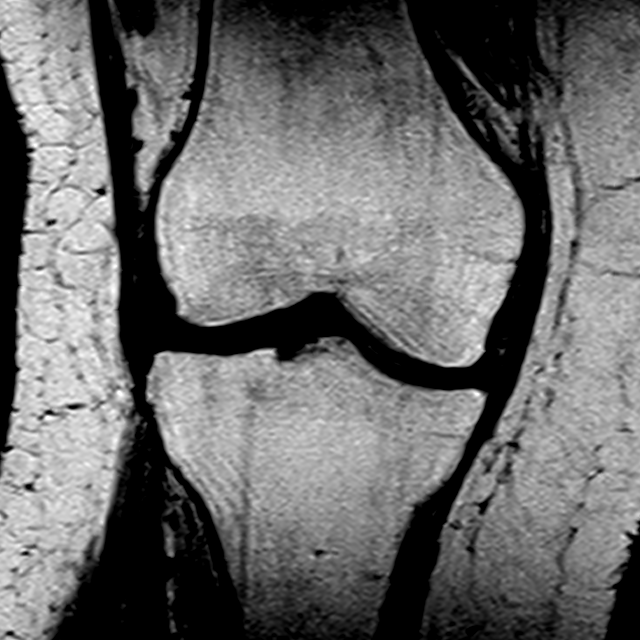
[im 19/24]
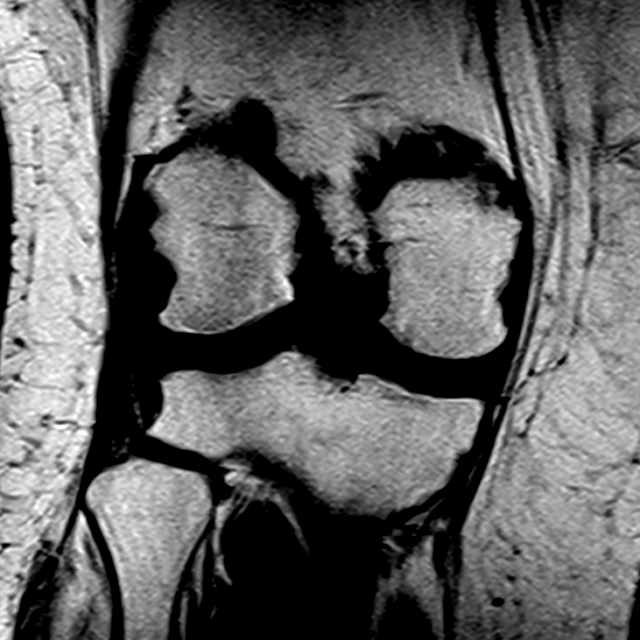
[im 24/24]
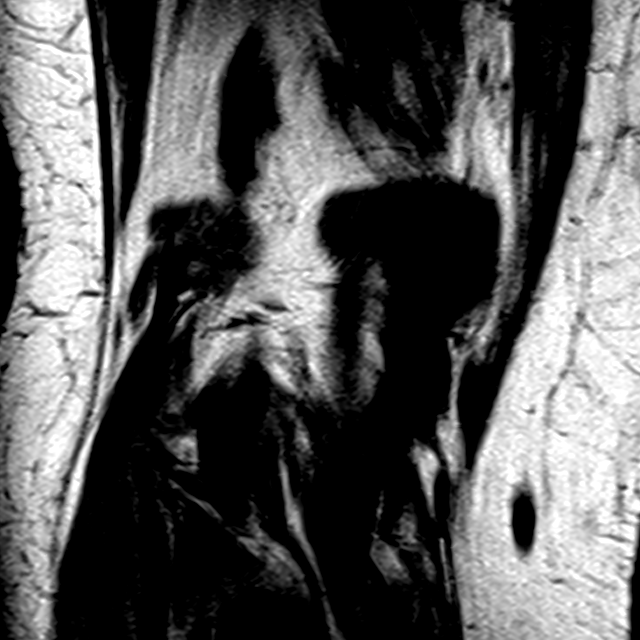

[Series 5: pdfs sag blade · sagittal · 3.0mm · 0.46mm/px · 3 of 31 slices shown]
[im 5/31]
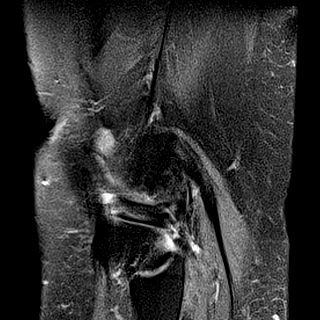
[im 18/31]
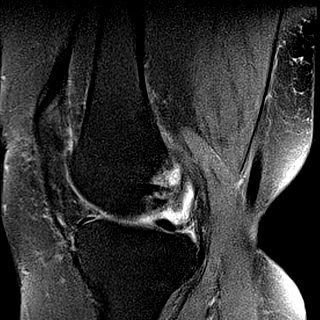
[im 26/31]
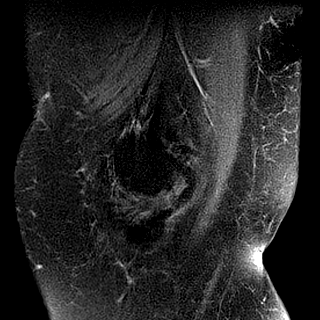

[Series 6: t2fs cor blade · coronal · 3.0mm · 0.42mm/px · 3 of 26 slices shown]
[im 5/26]
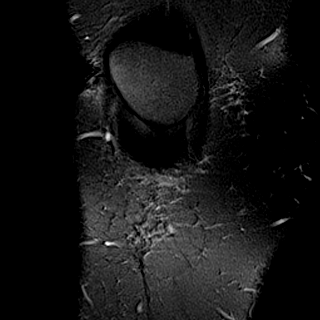
[im 13/26]
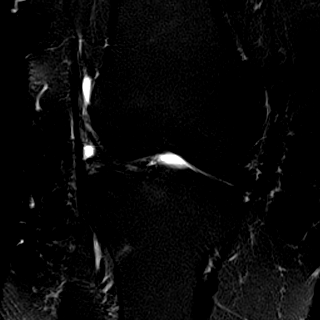
[im 21/26]
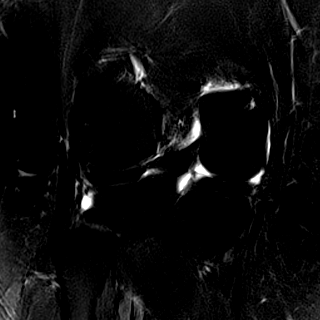

[17 of 40 positions shown; findings below may reference images not displayed]

FINDINGS: Despite efforts by the technologist and patient, motion artifact is
present on today's exam and could not be eliminated. This reduces
exam sensitivity and specificity.

MENISCI

Medial meniscus: Grade 3 oblique tear involving the superior surface
of the posterior horn medial meniscus on image [DATE], and extending
towards the inferior surface. Considerable adjacent grade 2 signal
in the posterior horn.

Lateral meniscus: Grade 3 oblique tear in the anterior horn lateral
meniscus extends to the meniscal periphery and superior meniscal
surface, with a peripheral 1.5 by 0.8 by 2.1 cm (volume = 1 cm^3)
parameniscal cyst. There is also horizontal signal in the anterior
horn and midbody lateral meniscus extending to the free edge.

LIGAMENTS

Cruciates:  Unremarkable

Collaterals:  Unremarkable

CARTILAGE

Patellofemoral:  Moderate degenerative chondral thinning.

Medial:  Moderate to prominent degenerative chondral thinning.

Lateral:  Moderate degenerative chondral thinning.

Joint:  No significant knee effusion.

Popliteal Fossa:  Unremarkable

Extensor Mechanism:  Unremarkable

Bones: No significant extra-articular osseous abnormalities
identified.

Other: No supplemental non-categorized findings.
IMPRESSION: 1. Grade 3 oblique tear anterior horn lateral meniscus involving the
superior surface and meniscal periphery, with a 1 cc parameniscal
cyst tracking along the periphery. There is also horizontal grade 3
signal in the anterior horn and midbody of the lateral meniscus
extending to the free edge.
2. Subtle oblique grade 3 tear involving the superior surface of the
posterior horn medial meniscus.
3. Tricompartmental moderate degenerative chondral thinning.
4. Despite efforts by the technologist and patient, motion artifact
is present on today's exam and could not be eliminated. This reduces
exam sensitivity and specificity.

## 2018-08-05 DIAGNOSIS — I5032 Chronic diastolic (congestive) heart failure: Secondary | ICD-10-CM | POA: Diagnosis not present

## 2018-08-05 DIAGNOSIS — R269 Unspecified abnormalities of gait and mobility: Secondary | ICD-10-CM | POA: Diagnosis not present

## 2018-08-08 ENCOUNTER — Other Ambulatory Visit: Payer: Self-pay | Admitting: Cardiology

## 2018-08-10 DIAGNOSIS — R410 Disorientation, unspecified: Secondary | ICD-10-CM | POA: Diagnosis not present

## 2018-08-10 DIAGNOSIS — R350 Frequency of micturition: Secondary | ICD-10-CM | POA: Diagnosis not present

## 2018-08-10 DIAGNOSIS — M5441 Lumbago with sciatica, right side: Secondary | ICD-10-CM | POA: Diagnosis not present

## 2018-08-10 DIAGNOSIS — M5442 Lumbago with sciatica, left side: Secondary | ICD-10-CM | POA: Diagnosis not present

## 2018-08-13 DIAGNOSIS — R7303 Prediabetes: Secondary | ICD-10-CM | POA: Diagnosis not present

## 2018-08-13 DIAGNOSIS — D510 Vitamin B12 deficiency anemia due to intrinsic factor deficiency: Secondary | ICD-10-CM | POA: Diagnosis not present

## 2018-08-13 DIAGNOSIS — R5383 Other fatigue: Secondary | ICD-10-CM | POA: Diagnosis not present

## 2018-08-13 DIAGNOSIS — E039 Hypothyroidism, unspecified: Secondary | ICD-10-CM | POA: Diagnosis not present

## 2018-08-13 DIAGNOSIS — N390 Urinary tract infection, site not specified: Secondary | ICD-10-CM | POA: Diagnosis not present

## 2018-08-25 ENCOUNTER — Other Ambulatory Visit: Payer: Self-pay

## 2018-08-25 ENCOUNTER — Encounter (HOSPITAL_COMMUNITY): Payer: Self-pay | Admitting: Emergency Medicine

## 2018-08-25 ENCOUNTER — Emergency Department (HOSPITAL_COMMUNITY): Payer: PPO

## 2018-08-25 ENCOUNTER — Emergency Department (HOSPITAL_COMMUNITY)
Admission: EM | Admit: 2018-08-25 | Discharge: 2018-08-25 | Disposition: A | Discharge status: Home / Self Care | Payer: PPO | Attending: Emergency Medicine | Admitting: Emergency Medicine

## 2018-08-25 DIAGNOSIS — I5042 Chronic combined systolic (congestive) and diastolic (congestive) heart failure: Secondary | ICD-10-CM | POA: Insufficient documentation

## 2018-08-25 DIAGNOSIS — F0390 Unspecified dementia without behavioral disturbance: Secondary | ICD-10-CM | POA: Insufficient documentation

## 2018-08-25 DIAGNOSIS — I110 Hypertensive heart disease with heart failure: Secondary | ICD-10-CM | POA: Diagnosis not present

## 2018-08-25 DIAGNOSIS — J449 Chronic obstructive pulmonary disease, unspecified: Secondary | ICD-10-CM | POA: Insufficient documentation

## 2018-08-25 DIAGNOSIS — J069 Acute upper respiratory infection, unspecified: Secondary | ICD-10-CM | POA: Diagnosis not present

## 2018-08-25 DIAGNOSIS — Z87891 Personal history of nicotine dependence: Secondary | ICD-10-CM | POA: Insufficient documentation

## 2018-08-25 DIAGNOSIS — R05 Cough: Secondary | ICD-10-CM | POA: Diagnosis not present

## 2018-08-25 DIAGNOSIS — Z79899 Other long term (current) drug therapy: Secondary | ICD-10-CM | POA: Diagnosis not present

## 2018-08-25 DIAGNOSIS — Z7901 Long term (current) use of anticoagulants: Secondary | ICD-10-CM | POA: Diagnosis not present

## 2018-08-25 DIAGNOSIS — E039 Hypothyroidism, unspecified: Secondary | ICD-10-CM | POA: Insufficient documentation

## 2018-08-25 DIAGNOSIS — Z794 Long term (current) use of insulin: Secondary | ICD-10-CM | POA: Insufficient documentation

## 2018-08-25 DIAGNOSIS — E119 Type 2 diabetes mellitus without complications: Secondary | ICD-10-CM | POA: Insufficient documentation

## 2018-08-25 MED ORDER — DEXAMETHASONE SODIUM PHOSPHATE 10 MG/ML IJ SOLN
10.0000 mg | Freq: Once | INTRAMUSCULAR | Status: AC
  Administered 2018-08-25: 10 mg via INTRAMUSCULAR
  Filled 2018-08-25: qty 1

## 2018-08-25 MED ORDER — DOXYCYCLINE HYCLATE 100 MG PO TABS
100.0000 mg | ORAL_TABLET | Freq: Once | ORAL | Status: AC
  Administered 2018-08-25: 100 mg via ORAL
  Filled 2018-08-25: qty 1

## 2018-08-25 MED ORDER — PREDNISONE 10 MG PO TABS: 10.0000 mg | ORAL_TABLET | Freq: Every day | ORAL | 0 refills | Status: DC

## 2018-08-25 MED ORDER — ACETAMINOPHEN 325 MG PO TABS
ORAL_TABLET | ORAL | Status: AC
  Filled 2018-08-25: qty 2

## 2018-08-25 MED ORDER — ACETAMINOPHEN 325 MG PO TABS
650.0000 mg | ORAL_TABLET | Freq: Once | ORAL | Status: AC
  Administered 2018-08-25: 650 mg via ORAL

## 2018-08-25 MED ORDER — DOXYCYCLINE HYCLATE 100 MG PO CAPS: 100.0000 mg | ORAL_CAPSULE | Freq: Two times a day (BID) | ORAL | 0 refills | Status: DC

## 2018-08-25 MED ORDER — ALBUTEROL SULFATE HFA 108 (90 BASE) MCG/ACT IN AERS
2.0000 | INHALATION_SPRAY | RESPIRATORY_TRACT | Status: DC
  Administered 2018-08-25: 2 via RESPIRATORY_TRACT
  Filled 2018-08-25: qty 6.7

## 2018-08-25 MED ORDER — DEXAMETHASONE SODIUM PHOSPHATE 4 MG/ML IJ SOLN: 10.0000 mg | Freq: Once | INTRAMUSCULAR | Status: DC

## 2018-08-25 MED ORDER — IPRATROPIUM-ALBUTEROL 0.5-2.5 (3) MG/3ML IN SOLN
3.0000 mL | Freq: Once | RESPIRATORY_TRACT | Status: AC
  Administered 2018-08-25: 3 mL via RESPIRATORY_TRACT
  Filled 2018-08-25: qty 3

## 2018-08-25 NOTE — Discharge Instructions (Addendum)
Chest x-ray showed no obvious pneumonia.  However, will start an antibiotic because of your weak lungs.  Also you were given a an albuterol inhaler.  Prescription for prednisone.

## 2018-08-25 NOTE — ED Triage Notes (Signed)
Pt comes in for a cough X 2 days, has been using Mucinex at home.  Small amount of white sputum. Hx of CHF, denies wt gain, swelling in BLE or abd.

## 2018-08-26 NOTE — ED Provider Notes (Signed)
[facility] EMERGENCY DEPARTMENT Provider Note   CSN: [id number] Arrival date & time: 08/25/18  1842    History   Chief Complaint Chief Complaint  Patient presents with  . Cough    HPI [name] is a 68 y.o. female.     Cough, wheezing, dyspnea for 2 days.  Patient has COPD and is on 2 L of oxygen PRN.  No substernal chest pain, extremity swelling, fever, sweats, chills.  PMH:  CHF, obesity, diabetes, hypertension, many others.  Severity is moderate.  Exertion makes symptoms worse.     Past Medical History:  Diagnosis Date  . Abnormal pulmonary function test   . Anemia    H/H of 10/30 with a normal MCV in 12/09  . Anxiety   . Arthritis   . Barrett's esophagus    Diagnosed 1995. Last EGD 2016-NO BARRETT'S.   . Chest pain    Negative cardiac catheterization in 2002; negative stress nuclear study in 2008  . Chronic anticoagulation   . Chronic combined systolic and diastolic CHF (congestive heart failure) (HCC)    a. EF predominantly normal during prior echoes but was 40% during 10/2014 echo. b. Most recent 01/2015 EF normal, 55-60%.  . Chronic LBP    Surgical intervention in 1996  . Diabetes mellitus, type 2 (HCC)    Insulin therapy; exacerbated by prednisone  . Dysrhythmia    AFib  . Gastroparesis    99% retention 05/2008 on GES  . GERD (gastroesophageal reflux disease)   . Hiatal hernia   . Hyperlipidemia   . Hypertension   . Hypothyroid   . IBS (irritable bowel syndrome)   . Obesity   . OSA on CPAP    had CPAP and cannot tolerate.  . Paroxysmal atrial fibrillation (HCC)   . Pulmonary hypertension (HCC) 01/2015   a. Predominantly pulmonary venous hypertension but may be component of PAH.  . Seizures (HCC)    last seizure was 2 years ago; on keppra for this; unknown etiology  . Syncope    a. Admitted 05/2009; magnetic resonance imagin/ MRA - negative; etiology thought to be orthostasis secondary to drugs and dehydration. b. Syncope 02/2015 also felt 2/2  dehydration.    Patient Active Problem List   Diagnosis Date Noted  . Chronic respiratory failure with hypoxia and hypercapnia (HCC) 12/13/2017  . Acute systolic HF (heart failure) (HCC) 11/26/2017  . Palliative care by specialist   . Goals of care, counseling/discussion   . Acute on chronic diastolic CHF (congestive heart failure) (HCC) 08/17/2017  . Acute respiratory failure with hypoxia and hypercapnia (HCC) 08/16/2017  . Uncontrolled type 2 diabetes mellitus with hyperglycemia (HCC) 08/16/2017  . Acute respiratory failure with hypercapnia (HCC)   . CAP Failed Outpatient Tx 08/05/2017  . COPD exacerbation (HCC) 08/05/2017  . Pulmonary hypertension (HCC) 04/05/2017  . Derangement of posterior horn of medial meniscus of right knee   . Lateral meniscus, anterior horn derangement, right   . Syncope, near 12/26/2015  . Fall at home 12/26/2015  . Dysphagia 12/07/2015  . IDA (iron deficiency anemia) 03/13/2015  . Chronic diastolic CHF (congestive heart failure) (HCC) 02/20/2015  . Syncope and collapse 02/05/2015  . Dehydration   . Acute on chronic combined systolic and diastolic CHF (congestive heart failure) (HCC) 01/26/2015  . Low hemoglobin   . Diffuse abdominal pain 09/16/2014  . Ascites 08/27/2014  . Anemia 07/20/2014  . Epistaxis, recurrent 07/20/2014  . Syncope 04/11/2014  . Dementia (HCC) 04/11/2014  .  Chronic diarrhea 04/11/2014  . DM type 2 (diabetes mellitus, type 2) (HCC) 04/11/2014  . Protein-calorie malnutrition, severe (HCC) 04/11/2014  . Acute CHF (HCC) 12/21/2013  . Atrial fibrillation with RVR (HCC) 12/21/2013  . Chest pain at rest 12/03/2013  . Obesity 12/03/2013  . Chest pain 12/03/2013  . Diarrhea 08/26/2013  . Abnormal weight loss 08/26/2013  . Lower abdominal pain 08/26/2013  . Anorexia nervosa 08/26/2013  . Encounter for therapeutic drug monitoring 08/26/2013  . DOE (dyspnea on exertion) 06/29/2013  . Tremor 10/27/2012  . Chronic anticoagulation  06/23/2011  . HYPOTENSION, ORTHOSTATIC 08/26/2009  . Hyperlipidemia 03/04/2009  . Barrett's esophagus 09/26/2008  . Gastroparesis 09/26/2008  . Iron deficiency anemia 09/25/2008  . Essential hypertension 09/25/2008  . LUNG NODULE 09/25/2008  . Irritable bowel syndrome 09/25/2008  . Sleep apnea 09/25/2008  . CARPAL TUNNEL SYNDROME, HX OF 09/25/2008  . Hypothyroidism 04/10/2008  . DIABETES MELLITUS, TYPE II, ON INSULIN 04/10/2008  . OBESITY 04/10/2008  . Atrial fibrillation (HCC) 04/10/2008  . Congestive heart failure (HCC) 04/10/2008  . LOW BACK PAIN, CHRONIC 04/10/2008  . CHEST PAIN-PRECORDIAL 04/10/2008    Past Surgical History:  Procedure Laterality Date  . BACK SURGERY  1996  . BIOPSY N/A 11/08/2013   Procedure: BIOPSY  / Tissue sampling / ulcers present in small intestine;  Surgeon: West Bali, MD;  Location: [facility] ENDO SUITE;  Service: Endoscopy;  Laterality: N/A;  . CARDIAC CATHETERIZATION  2002  . CARDIAC CATHETERIZATION N/A 01/26/2015   Procedure: Right Heart Cath;  Surgeon: Laurey Morale, MD;  Location: [facility] INVASIVE CV LAB;  Service: Cardiovascular;  Laterality: N/A;  . CARDIOVASCULAR STRESS TEST  2008   Stress nuclear study  . CARDIOVERSION N/A 03/06/2015   Procedure: CARDIOVERSION;  Surgeon: Laurey Morale, MD;  Location: [facility] ENDOSCOPY;  Service: Cardiovascular;  Laterality: N/A;  . CARPAL TUNNEL RELEASE  1994  . COLONOSCOPY  11/2011   Dr. Darrick Penna: Internal hemorrhoids, mild diverticulosis. Random colon biopsies negative.  . COLONOSCOPY N/A 11/08/2013   SLF: Normal mucosa in the terminal ileum/The colon IS redundant/  Moderate diverticulosis throughout the entire colon. ileum bx benign. colon bx benign  . ESOPHAGOGASTRODUODENOSCOPY  2008   Barrett's without dysplasia. esphagus dilated. antral erosions, h.pylori serologies negative.  . ESOPHAGOGASTRODUODENOSCOPY  11/2011   Dr. Darrick Penna: Barrett's esophagus, mild gastritis, diverticulum in the second portion of the duodenum  repeat EGD 3 years. Small bowel biopsies negative. Gastric biopsy show reactive gastropathy but no H. pylori. Esophageal biopsies consistent with GERD. Next EGD 11/2014  . ESOPHAGOGASTRODUODENOSCOPY N/A 11/21/2014   HBZ:JIRC non-erosive gastritis/irregular z-line  . GIVENS CAPSULE STUDY  12/07/2011   Proximal small bowel, rare AVM. Distal small bowel, multiple ulcers noted  . GIVENS CAPSULE STUDY N/A 09/27/2013   Distal small bowel ulcers extending to TI.  Marland Kitchen GIVENS CAPSULE STUDY N/A 10/10/2013   Procedure: GIVENS CAPSULE STUDY;  Surgeon: West Bali, MD;  Location: [facility] ENDO SUITE;  Service: Endoscopy;  Laterality: N/A;  7:30  . IR KYPHO THORACIC WITH BONE BIOPSY  02/09/2018  . KNEE ARTHROSCOPY WITH MEDIAL MENISECTOMY Right 06/09/2016   Procedure: KNEE ARTHROSCOPY WITH MEDIAL MENISECTOMY;  Surgeon: Vickki Hearing, MD;  Location: [facility] ORS;  Service: Orthopedics;  Laterality: Right;  medial and lateral menisectomy  . LAMINECTOMY  1995   L4-L5  . LAPAROSCOPIC CHOLECYSTECTOMY  1990s  . LEFT HEART CATHETERIZATION WITH CORONARY ANGIOGRAM  01/10/2014   Procedure: LEFT HEART CATHETERIZATION WITH CORONARY ANGIOGRAM;  Surgeon: Lyn Records  III, MD;  Location: [facility] CATH LAB;  Service: Cardiovascular;;  . RIGHT HEART CATHETERIZATION N/A 01/10/2014   Procedure: RIGHT HEART CATH;  Surgeon: Lesleigh Noe, MD;  Location: [facility] CATH LAB;  Service: Cardiovascular;  Laterality: N/A;  . TOTAL ABDOMINAL HYSTERECTOMY  1999  . TRACHEOSTOMY TUBE PLACEMENT N/A 09/01/2017   Procedure: TRACHEOSTOMY;  Surgeon: Drema Halon, MD;  Location: [facility] OR;  Service: ENT;  Laterality: N/A;     OB History    Gravida  2   Para  2   Term  2   Preterm      AB      Living        SAB      TAB      Ectopic      Multiple      Live Births               Home Medications    Prior to Admission medications   Medication Sig Start Date End Date Taking? Authorizing Provider  acetaminophen (TYLENOL) 500 MG tablet  Take 500 mg by mouth every 6 (six) hours as needed for moderate pain or headache.    [provider]  amiodarone (PACERONE) 200 MG tablet TAKE ONE TABLET BY MOUTH EVERY MORNING 04/23/18   Antoine Poche, MD  amoxicillin (AMOXIL) 500 MG capsule TAKE ONE CAPSULE BY MOUTH THREE TIMES DAILY UNTIL ALL TAKEN 05/17/18   [provider]  apixaban (ELIQUIS) 5 MG TABS tablet Take 1 tablet (5 mg total) by mouth 2 (two) times daily. 06/09/16   Vickki Hearing, MD  Cholecalciferol (VITAMIN D3) 2000 units capsule Take 2,000 Units by mouth daily. 10/25/17   [provider]  dicyclomine (BENTYL) 10 MG capsule Take 2 capsules (20 mg total) by mouth 2 (two) times daily. 04/25/18   Gelene Mink, NP  doxycycline (VIBRAMYCIN) 100 MG capsule Take 1 capsule (100 mg total) by mouth 2 (two) times daily. 08/25/18   Donnetta Hutching, MD  DULoxetine (CYMBALTA) 30 MG capsule Take 30 mg by mouth 2 (two) times daily. 11/24/17   [provider]  gabapentin (NEURONTIN) 400 MG capsule Take 400-800 mg by mouth 3 (three) times daily. Take 1 capsule(400mg ) by mouth twice a day and take 2 capsules(800mg ) by mouth at bedtime 07/18/17   [provider]  glipiZIDE (GLUCOTROL) 10 MG tablet Take 10 mg by mouth 2 (two) times daily. 11/24/17   [provider]  insulin aspart (NOVOLOG) 100 UNIT/ML injection Inject 0-20 Units into the skin every 4 (four) hours. CBG 0-120--no units; 121-150--3 units; 151-200--4 units; 201-250--7units; 251-300--11 units; 301-350--15 units; 351-400--20 units 08/22/17   Tat, David, MD  insulin glargine (LANTUS) 100 UNIT/ML injection Inject 0.55 mLs (55 Units total) into the skin at bedtime. 08/22/17   Catarina Hartshorn, MD  LANTUS SOLOSTAR 100 UNIT/ML Solostar Pen  05/21/18   [provider]  levETIRAcetam (KEPPRA) 1000 MG tablet Take 1,000 mg by mouth 2 (two) times daily. 07/18/17   [provider]  levothyroxine (SYNTHROID, LEVOTHROID) 137 MCG tablet Take  137 mcg by mouth daily. 10/14/14   [provider]  loperamide (IMODIUM) 2 MG capsule TAKE ONE CAPSULE BY MOUTH TWICE DAILY AS NEEDED 06/20/18   Anice Paganini, NP  LORazepam (ATIVAN) 1 MG tablet Take 1 mg by mouth 3 (three) times daily as needed. for anxiety 11/24/17   [provider]  magnesium oxide (MAG-OX) 400 MG tablet Take 1 tablet (400 mg total)  by mouth 2 (two) times daily. 12/26/17   Antoine Poche, MD  meclizine (ANTIVERT) 25 MG tablet  05/21/18   [provider]  metFORMIN (GLUCOPHAGE) 500 MG tablet Take 500 mg by mouth 2 (two) times daily with a meal.    [provider]  Multiple Vitamin (MULTIVITAMIN WITH MINERALS) TABS tablet Take 1 tablet by mouth daily.    [provider]  naproxen sodium (ALEVE) 220 MG tablet Take 440 mg by mouth as needed.    [provider]  nystatin (MYCOSTATIN/NYSTOP) powder Apply topically 2 (two) times daily. To affected area. Keep dry. Patient taking differently: Apply 1 Bottle topically 2 (two) times daily as needed. To affected area. Keep dry. 11/24/17   Dunn, Tacey Ruiz, PA-C  ondansetron (ZOFRAN) 4 MG tablet TAKE 1 TABLET BY MOUTH FOUR TIMES DAILY AS NEEDED FOR NAUSEA 01/10/18   Gelene Mink, NP  OXYGEN 2lpm with sleep and as needed during the day    [provider]  pantoprazole (PROTONIX) 40 MG tablet TAKE ONE TABLET BY MOUTH TWICE DAILY BEFORE APPLY MEAL 02/07/17   Gelene Mink, NP  potassium chloride (K-DUR,KLOR-CON) 10 MEQ tablet TAKE TWO TABLETS BY MOUTH TWICE DAILY 08/08/18   Antoine Poche, MD  predniSONE (DELTASONE) 10 MG tablet Take 1 tablet (10 mg total) by mouth daily with breakfast. 2 tablets for 4 days, 1 tablet for 4 days, 1/2 tablet for 4 days 08/25/18   Donnetta Hutching, MD  torsemide (DEMADEX) 20 MG tablet TAKE TWO TABLETS BY MOUTH IN THE MORNING AND TAKE 1 TABLET IN THE EVENING EVERY DAY (MAY TAKE ADDITIONAL TABLET AS NEEDED FOR SWELLING 08/08/18   Antoine Poche, MD  traMADol  (ULTRAM) 50 MG tablet Take 50 mg by mouth 2 (two) times daily as needed. 01/03/18   [provider]    Family History Family History  Problem Relation Age of Onset  . Hypertension Mother   . Alzheimer's disease Mother   . Stroke Mother   . Heart attack Mother   . Hypertension Other   . Breast cancer Sister   . Heart disease Neg Hx   . Colon cancer Neg Hx     Social History Social History   Tobacco Use  . Smoking status: Former Smoker    Packs/day: 0.25    Years: 15.00    Pack years: 3.75    Types: Cigarettes    Start date: 02/26/1966    Last attempt to quit: 07/01/1983    Years since quitting: 35.1  . Smokeless tobacco: Never Used  . Tobacco comment: quit in 1984  Substance Use Topics  . Alcohol use: No    Alcohol/week: 0.0 standard drinks  . Drug use: No     Allergies   Celexa [citalopram hydrobromide]; Codeine; Dilaudid [hydromorphone hcl]; and Reglan [metoclopramide]   Review of Systems Review of Systems  All other systems reviewed and are negative.    Physical Exam Updated Vital Signs BP (!) 154/91   Pulse 85   Temp 98.1 F (36.7 C) (Temporal)   Resp 18   Ht 5\' 6"  (1.676 m)   Wt 110.2 kg   SpO2 97%   BMI 39.22 kg/m   Physical Exam Vitals signs and nursing note reviewed.  Constitutional:      Appearance: She is well-developed.     Comments: Mild dyspnea.  HENT:     Head: Normocephalic and atraumatic.  Eyes:     Conjunctiva/sclera: Conjunctivae normal.  Neck:  Musculoskeletal: Neck supple.  Cardiovascular:     Rate and Rhythm: Normal rate and regular rhythm.  Pulmonary:     Effort: Pulmonary effort is normal.     Comments: Scattered rhonchi. Abdominal:     General: Bowel sounds are normal.     Palpations: Abdomen is soft.  Musculoskeletal: Normal range of motion.  Skin:    General: Skin is warm and dry.  Neurological:     Mental Status: She is alert and oriented to person, place, and time.  Psychiatric:        Behavior:  Behavior normal.      ED Treatments / Results  Labs (all labs ordered are listed, but only abnormal results are displayed) Labs Reviewed - No data to display  EKG None  Radiology Dg Chest 2 View  Result Date: 08/25/2018 CLINICAL DATA:  Cough EXAM: CHEST - 2 VIEW COMPARISON:  01/27/2018 FINDINGS: Cardiomegaly. Patchy airspace disease in the left lung base stable compatible with scarring. Mild vascular congestion. No overt edema or effusions. No acute bony abnormality. IMPRESSION: Cardiomegaly with vascular congestion. Stable left basilar scarring. Electronically Signed   By: Charlett Nose M.D.   On: 08/25/2018 20:08    Procedures Procedures (including critical care time)  Medications Ordered in ED Medications  ipratropium-albuterol (DUONEB) 0.5-2.5 (3) MG/3ML nebulizer solution 3 mL (3 mLs Nebulization Given 08/25/18 2116)  dexamethasone (DECADRON) injection 10 mg (10 mg Intramuscular Given 08/25/18 2057)  acetaminophen (TYLENOL) tablet 650 mg (650 mg Oral Not Given 08/25/18 2135)  doxycycline (VIBRA-TABS) tablet 100 mg (100 mg Oral Given 08/25/18 2307)     Initial Impression / Assessment and Plan / ED Course  I have reviewed the triage vital signs and the nursing notes.  Pertinent labs & imaging results that were available during my care of the patient were reviewed by me and considered in my medical decision making (see chart for details).        Patient presents with coughing and wheezing.  She is hemodynamically stable.  Her normal pulse ox is in the low 90s.  Chest x-ray shows no pneumonia.  She responded well to IM Decadron and a nebulizer treatment.  Discharge medication prednisone and doxycycline 100 mg.  Final Clinical Impressions(s) / ED Diagnoses   Final diagnoses:  Upper respiratory tract infection, unspecified type    ED Discharge Orders         Ordered    doxycycline (VIBRAMYCIN) 100 MG capsule  2 times daily     08/25/18 2322    predniSONE (DELTASONE) 10  MG tablet  Daily with breakfast     08/25/18 2322           Donnetta Hutching, MD 08/26/18 [phone]

## 2018-08-31 ENCOUNTER — Other Ambulatory Visit: Payer: Self-pay

## 2018-08-31 ENCOUNTER — Inpatient Hospital Stay (HOSPITAL_COMMUNITY)
Admission: EM | Admit: 2018-08-31 | Discharge: 2018-09-02 | DRG: 193 | Disposition: A | Discharge status: Home / Self Care | Payer: PPO | Attending: Family Medicine | Admitting: Family Medicine

## 2018-08-31 ENCOUNTER — Emergency Department (HOSPITAL_COMMUNITY): Payer: PPO

## 2018-08-31 ENCOUNTER — Encounter (HOSPITAL_COMMUNITY): Payer: Self-pay | Admitting: Emergency Medicine

## 2018-08-31 DIAGNOSIS — K589 Irritable bowel syndrome without diarrhea: Secondary | ICD-10-CM | POA: Diagnosis not present

## 2018-08-31 DIAGNOSIS — I4891 Unspecified atrial fibrillation: Secondary | ICD-10-CM | POA: Diagnosis present

## 2018-08-31 DIAGNOSIS — E038 Other specified hypothyroidism: Secondary | ICD-10-CM | POA: Diagnosis not present

## 2018-08-31 DIAGNOSIS — J181 Lobar pneumonia, unspecified organism: Secondary | ICD-10-CM

## 2018-08-31 DIAGNOSIS — E669 Obesity, unspecified: Secondary | ICD-10-CM | POA: Diagnosis present

## 2018-08-31 DIAGNOSIS — I482 Chronic atrial fibrillation, unspecified: Secondary | ICD-10-CM | POA: Diagnosis present

## 2018-08-31 DIAGNOSIS — E1143 Type 2 diabetes mellitus with diabetic autonomic (poly)neuropathy: Secondary | ICD-10-CM | POA: Diagnosis not present

## 2018-08-31 DIAGNOSIS — G8929 Other chronic pain: Secondary | ICD-10-CM | POA: Diagnosis not present

## 2018-08-31 DIAGNOSIS — G3184 Mild cognitive impairment, so stated: Secondary | ICD-10-CM | POA: Diagnosis not present

## 2018-08-31 DIAGNOSIS — I480 Paroxysmal atrial fibrillation: Secondary | ICD-10-CM | POA: Diagnosis present

## 2018-08-31 DIAGNOSIS — Z9981 Dependence on supplemental oxygen: Secondary | ICD-10-CM

## 2018-08-31 DIAGNOSIS — E119 Type 2 diabetes mellitus without complications: Secondary | ICD-10-CM

## 2018-08-31 DIAGNOSIS — G9341 Metabolic encephalopathy: Secondary | ICD-10-CM | POA: Diagnosis present

## 2018-08-31 DIAGNOSIS — I110 Hypertensive heart disease with heart failure: Secondary | ICD-10-CM | POA: Diagnosis present

## 2018-08-31 DIAGNOSIS — J441 Chronic obstructive pulmonary disease with (acute) exacerbation: Secondary | ICD-10-CM | POA: Diagnosis not present

## 2018-08-31 DIAGNOSIS — J101 Influenza due to other identified influenza virus with other respiratory manifestations: Secondary | ICD-10-CM | POA: Diagnosis not present

## 2018-08-31 DIAGNOSIS — R0602 Shortness of breath: Secondary | ICD-10-CM | POA: Diagnosis not present

## 2018-08-31 DIAGNOSIS — Z820 Family history of epilepsy and other diseases of the nervous system: Secondary | ICD-10-CM

## 2018-08-31 DIAGNOSIS — E785 Hyperlipidemia, unspecified: Secondary | ICD-10-CM | POA: Diagnosis present

## 2018-08-31 DIAGNOSIS — E039 Hypothyroidism, unspecified: Secondary | ICD-10-CM | POA: Diagnosis present

## 2018-08-31 DIAGNOSIS — J9611 Chronic respiratory failure with hypoxia: Secondary | ICD-10-CM | POA: Diagnosis not present

## 2018-08-31 DIAGNOSIS — Z885 Allergy status to narcotic agent status: Secondary | ICD-10-CM | POA: Diagnosis not present

## 2018-08-31 DIAGNOSIS — Z9049 Acquired absence of other specified parts of digestive tract: Secondary | ICD-10-CM

## 2018-08-31 DIAGNOSIS — J189 Pneumonia, unspecified organism: Secondary | ICD-10-CM | POA: Diagnosis not present

## 2018-08-31 DIAGNOSIS — Z823 Family history of stroke: Secondary | ICD-10-CM

## 2018-08-31 DIAGNOSIS — J1000 Influenza due to other identified influenza virus with unspecified type of pneumonia: Principal | ICD-10-CM | POA: Diagnosis present

## 2018-08-31 DIAGNOSIS — J9612 Chronic respiratory failure with hypercapnia: Secondary | ICD-10-CM | POA: Diagnosis present

## 2018-08-31 DIAGNOSIS — K219 Gastro-esophageal reflux disease without esophagitis: Secondary | ICD-10-CM | POA: Diagnosis not present

## 2018-08-31 DIAGNOSIS — E1169 Type 2 diabetes mellitus with other specified complication: Secondary | ICD-10-CM

## 2018-08-31 DIAGNOSIS — R0902 Hypoxemia: Secondary | ICD-10-CM | POA: Diagnosis not present

## 2018-08-31 DIAGNOSIS — J440 Chronic obstructive pulmonary disease with acute lower respiratory infection: Secondary | ICD-10-CM | POA: Diagnosis not present

## 2018-08-31 DIAGNOSIS — F419 Anxiety disorder, unspecified: Secondary | ICD-10-CM | POA: Diagnosis present

## 2018-08-31 DIAGNOSIS — G40909 Epilepsy, unspecified, not intractable, without status epilepticus: Secondary | ICD-10-CM | POA: Diagnosis present

## 2018-08-31 DIAGNOSIS — F32A Depression, unspecified: Secondary | ICD-10-CM

## 2018-08-31 DIAGNOSIS — E860 Dehydration: Secondary | ICD-10-CM | POA: Diagnosis present

## 2018-08-31 DIAGNOSIS — Z8249 Family history of ischemic heart disease and other diseases of the circulatory system: Secondary | ICD-10-CM

## 2018-08-31 DIAGNOSIS — I272 Pulmonary hypertension, unspecified: Secondary | ICD-10-CM | POA: Diagnosis present

## 2018-08-31 DIAGNOSIS — I509 Heart failure, unspecified: Secondary | ICD-10-CM

## 2018-08-31 DIAGNOSIS — I5042 Chronic combined systolic (congestive) and diastolic (congestive) heart failure: Secondary | ICD-10-CM | POA: Diagnosis not present

## 2018-08-31 DIAGNOSIS — M545 Low back pain: Secondary | ICD-10-CM | POA: Diagnosis not present

## 2018-08-31 DIAGNOSIS — Z888 Allergy status to other drugs, medicaments and biological substances status: Secondary | ICD-10-CM | POA: Diagnosis not present

## 2018-08-31 DIAGNOSIS — G4733 Obstructive sleep apnea (adult) (pediatric): Secondary | ICD-10-CM | POA: Diagnosis present

## 2018-08-31 DIAGNOSIS — Z7901 Long term (current) use of anticoagulants: Secondary | ICD-10-CM

## 2018-08-31 DIAGNOSIS — F329 Major depressive disorder, single episode, unspecified: Secondary | ICD-10-CM | POA: Diagnosis present

## 2018-08-31 DIAGNOSIS — Z794 Long term (current) use of insulin: Secondary | ICD-10-CM

## 2018-08-31 DIAGNOSIS — I5032 Chronic diastolic (congestive) heart failure: Secondary | ICD-10-CM | POA: Diagnosis not present

## 2018-08-31 DIAGNOSIS — Z803 Family history of malignant neoplasm of breast: Secondary | ICD-10-CM

## 2018-08-31 DIAGNOSIS — Z79899 Other long term (current) drug therapy: Secondary | ICD-10-CM

## 2018-08-31 DIAGNOSIS — Z6837 Body mass index (BMI) 37.0-37.9, adult: Secondary | ICD-10-CM

## 2018-08-31 DIAGNOSIS — Z90710 Acquired absence of both cervix and uterus: Secondary | ICD-10-CM

## 2018-08-31 DIAGNOSIS — R4182 Altered mental status, unspecified: Secondary | ICD-10-CM | POA: Diagnosis not present

## 2018-08-31 LAB — CBC
HCT: 41.5 % (ref 36.0–46.0)
Hemoglobin: 12.5 g/dL (ref 12.0–15.0)
MCH: 31.9 pg (ref 26.0–34.0)
MCHC: 30.1 g/dL (ref 30.0–36.0)
MCV: 105.9 fL — ABNORMAL HIGH (ref 80.0–100.0)
Platelets: 154 10*3/uL (ref 150–400)
RBC: 3.92 MIL/uL (ref 3.87–5.11)
RDW: 14.5 % (ref 11.5–15.5)
WBC: 4.8 10*3/uL (ref 4.0–10.5)
nRBC: 0 % (ref 0.0–0.2)

## 2018-08-31 LAB — BASIC METABOLIC PANEL
Anion gap: 7 (ref 5–15)
BUN: 15 mg/dL (ref 8–23)
CO2: 26 mmol/L (ref 22–32)
Calcium: 8.9 mg/dL (ref 8.9–10.3)
Chloride: 105 mmol/L (ref 98–111)
Creatinine, Ser: 0.77 mg/dL (ref 0.44–1.00)
GFR calc Af Amer: >60 mL/min (ref >60)
GFR calc non Af Amer: >60 mL/min (ref >60)
Glucose, Bld: 165 mg/dL — ABNORMAL HIGH (ref 70–99)
Potassium: 5.1 mmol/L (ref 3.5–5.1)
SODIUM: 138 mmol/L (ref 135–145)

## 2018-08-31 LAB — I-STAT TROPONIN, ED: Troponin i, poc: 0.07 ng/mL (ref 0.00–0.08)

## 2018-08-31 LAB — INFLUENZA PANEL BY PCR (TYPE A & B)
Influenza A By PCR: POSITIVE — AB
Influenza B By PCR: NEGATIVE

## 2018-08-31 LAB — GLUCOSE, CAPILLARY
Glucose-Capillary: 120 mg/dL — ABNORMAL HIGH (ref 70–99)
Glucose-Capillary: 178 mg/dL — ABNORMAL HIGH (ref 70–99)

## 2018-08-31 MED ORDER — PANTOPRAZOLE SODIUM 40 MG PO TBEC
40.0000 mg | DELAYED_RELEASE_TABLET | Freq: Two times a day (BID) | ORAL | Status: DC
  Administered 2018-08-31 – 2018-09-02 (×4): 40 mg via ORAL
  Filled 2018-08-31 (×5): qty 1

## 2018-08-31 MED ORDER — HYDROCODONE-ACETAMINOPHEN 10-325 MG PO TABS: 1.0000 | ORAL_TABLET | Freq: Four times a day (QID) | ORAL | Status: DC

## 2018-08-31 MED ORDER — INSULIN ASPART 100 UNIT/ML ~~LOC~~ SOLN: 0.0000 [IU] | Freq: Three times a day (TID) | SUBCUTANEOUS | Status: DC

## 2018-08-31 MED ORDER — LORAZEPAM 1 MG PO TABS
1.0000 mg | ORAL_TABLET | Freq: Three times a day (TID) | ORAL | Status: DC | PRN
  Administered 2018-08-31 – 2018-09-02 (×6): 1 mg via ORAL
  Filled 2018-08-31 (×6): qty 1

## 2018-08-31 MED ORDER — MAGNESIUM SULFATE 2 GM/50ML IV SOLN
2.0000 g | Freq: Once | INTRAVENOUS | Status: AC
  Administered 2018-08-31: 2 g via INTRAVENOUS
  Filled 2018-08-31: qty 50

## 2018-08-31 MED ORDER — ACETAMINOPHEN 325 MG PO TABS
650.0000 mg | ORAL_TABLET | Freq: Once | ORAL | Status: AC
  Administered 2018-08-31: 650 mg via ORAL
  Filled 2018-08-31: qty 2

## 2018-08-31 MED ORDER — ONDANSETRON HCL 4 MG/2ML IJ SOLN
4.0000 mg | Freq: Four times a day (QID) | INTRAMUSCULAR | Status: DC | PRN
  Administered 2018-09-01: 4 mg via INTRAVENOUS
  Filled 2018-08-31 (×2): qty 2

## 2018-08-31 MED ORDER — ACETAMINOPHEN 325 MG PO TABS
650.0000 mg | ORAL_TABLET | Freq: Four times a day (QID) | ORAL | Status: DC | PRN
  Administered 2018-09-01: 650 mg via ORAL
  Filled 2018-08-31: qty 2

## 2018-08-31 MED ORDER — APIXABAN 5 MG PO TABS
5.0000 mg | ORAL_TABLET | Freq: Two times a day (BID) | ORAL | Status: DC
  Administered 2018-08-31 – 2018-09-02 (×4): 5 mg via ORAL
  Filled 2018-08-31 (×4): qty 1

## 2018-08-31 MED ORDER — ALBUTEROL SULFATE (2.5 MG/3ML) 0.083% IN NEBU
2.5000 mg | INHALATION_SOLUTION | Freq: Once | RESPIRATORY_TRACT | Status: AC
  Administered 2018-08-31: 2.5 mg via RESPIRATORY_TRACT
  Filled 2018-08-31: qty 3

## 2018-08-31 MED ORDER — SODIUM CHLORIDE 0.9% FLUSH
3.0000 mL | Freq: Two times a day (BID) | INTRAVENOUS | Status: DC
  Administered 2018-09-01 (×2): 3 mL via INTRAVENOUS

## 2018-08-31 MED ORDER — LEVOFLOXACIN IN D5W 750 MG/150ML IV SOLN
750.0000 mg | Freq: Once | INTRAVENOUS | Status: DC
  Administered 2018-08-31: 750 mg via INTRAVENOUS
  Filled 2018-08-31: qty 150

## 2018-08-31 MED ORDER — AMIODARONE HCL 200 MG PO TABS
200.0000 mg | ORAL_TABLET | Freq: Every day | ORAL | Status: DC
  Administered 2018-09-01 – 2018-09-02 (×2): 200 mg via ORAL
  Filled 2018-08-31 (×3): qty 1

## 2018-08-31 MED ORDER — SODIUM CHLORIDE 0.9 % IV SOLN
500.0000 mg | INTRAVENOUS | Status: DC
  Administered 2018-08-31: 500 mg via INTRAVENOUS
  Filled 2018-08-31 (×4): qty 500

## 2018-08-31 MED ORDER — SODIUM CHLORIDE 0.9% FLUSH: 3.0000 mL | INTRAVENOUS | Status: DC | PRN

## 2018-08-31 MED ORDER — DULOXETINE HCL 30 MG PO CPEP
30.0000 mg | ORAL_CAPSULE | Freq: Two times a day (BID) | ORAL | Status: DC
  Administered 2018-08-31 – 2018-09-02 (×4): 30 mg via ORAL
  Filled 2018-08-31 (×4): qty 1

## 2018-08-31 MED ORDER — LEVETIRACETAM 750 MG PO TABS
1500.0000 mg | ORAL_TABLET | Freq: Two times a day (BID) | ORAL | Status: DC
  Administered 2018-08-31 – 2018-09-02 (×4): 1500 mg via ORAL
  Filled 2018-08-31 (×4): qty 2

## 2018-08-31 MED ORDER — LEVOTHYROXINE SODIUM 25 MCG PO TABS
137.0000 ug | ORAL_TABLET | Freq: Every day | ORAL | Status: DC
  Administered 2018-09-01 – 2018-09-02 (×2): 137 ug via ORAL
  Filled 2018-08-31 (×2): qty 1

## 2018-08-31 MED ORDER — IPRATROPIUM-ALBUTEROL 0.5-2.5 (3) MG/3ML IN SOLN
3.0000 mL | Freq: Three times a day (TID) | RESPIRATORY_TRACT | Status: DC
  Administered 2018-09-01: 3 mL via RESPIRATORY_TRACT
  Filled 2018-08-31: qty 3

## 2018-08-31 MED ORDER — SODIUM CHLORIDE 0.9% FLUSH
3.0000 mL | Freq: Once | INTRAVENOUS | Status: AC
  Administered 2018-08-31: 3 mL via INTRAVENOUS

## 2018-08-31 MED ORDER — IPRATROPIUM-ALBUTEROL 0.5-2.5 (3) MG/3ML IN SOLN
3.0000 mL | Freq: Once | RESPIRATORY_TRACT | Status: AC
  Administered 2018-08-31: 3 mL via RESPIRATORY_TRACT
  Filled 2018-08-31: qty 3

## 2018-08-31 MED ORDER — SODIUM CHLORIDE 0.9 % IV SOLN
250.0000 mL | INTRAVENOUS | Status: DC | PRN
  Administered 2018-09-02: 250 mL via INTRAVENOUS

## 2018-08-31 MED ORDER — HYDROCODONE-ACETAMINOPHEN 5-325 MG PO TABS
1.0000 | ORAL_TABLET | ORAL | Status: DC | PRN
  Administered 2018-08-31: 1 via ORAL
  Filled 2018-08-31: qty 1

## 2018-08-31 MED ORDER — METHYLPREDNISOLONE SODIUM SUCC 125 MG IJ SOLR
125.0000 mg | Freq: Once | INTRAMUSCULAR | Status: AC
  Administered 2018-08-31: 125 mg via INTRAVENOUS
  Filled 2018-08-31: qty 2

## 2018-08-31 MED ORDER — ACETAMINOPHEN 650 MG RE SUPP: 650.0000 mg | Freq: Four times a day (QID) | RECTAL | Status: DC | PRN

## 2018-08-31 MED ORDER — SODIUM CHLORIDE 0.9 % IV SOLN
1.0000 g | INTRAVENOUS | Status: DC
  Administered 2018-09-01 – 2018-09-02 (×3): 1 g via INTRAVENOUS
  Filled 2018-08-31 (×4): qty 10

## 2018-08-31 MED ORDER — IPRATROPIUM-ALBUTEROL 0.5-2.5 (3) MG/3ML IN SOLN
3.0000 mL | Freq: Four times a day (QID) | RESPIRATORY_TRACT | Status: DC
  Administered 2018-08-31: 3 mL via RESPIRATORY_TRACT
  Filled 2018-08-31: qty 3

## 2018-08-31 MED ORDER — MECLIZINE HCL 25 MG PO TABS
25.0000 mg | ORAL_TABLET | Freq: Every day | ORAL | Status: DC
  Administered 2018-09-01 – 2018-09-02 (×2): 25 mg via ORAL
  Filled 2018-08-31 (×2): qty 1

## 2018-08-31 MED ORDER — METHYLPREDNISOLONE SODIUM SUCC 40 MG IJ SOLR
40.0000 mg | Freq: Four times a day (QID) | INTRAMUSCULAR | Status: DC
  Administered 2018-08-31 – 2018-09-01 (×2): 40 mg via INTRAVENOUS
  Filled 2018-08-31 (×2): qty 1

## 2018-08-31 MED ORDER — ONDANSETRON HCL 4 MG PO TABS
4.0000 mg | ORAL_TABLET | Freq: Four times a day (QID) | ORAL | Status: DC | PRN
  Administered 2018-09-02: 4 mg via ORAL
  Filled 2018-08-31: qty 1

## 2018-08-31 NOTE — ED Provider Notes (Signed)
[facility] EMERGENCY DEPARTMENT Provider Note   CSN: [id number] Arrival date & time: 08/31/18  1245    History   Chief Complaint Chief Complaint  Patient presents with  . Shortness of Breath    HPI SAVONNAH LESEBERG is a 68 y.o. female.     Patient has been treated for about 6 days with doxycycline for pneumonia.  She is become more short of breath and more confused according to her daughter and also she seems to be weaker  The history is provided by the patient. No language interpreter was used.  Shortness of Breath  Severity:  Moderate Onset quality:  Sudden Timing:  Constant Progression:  Worsening Chronicity:  Recurrent Context: not animal exposure   Relieved by:  Nothing Worsened by:  Nothing Ineffective treatments:  None tried Associated symptoms: cough   Associated symptoms: no abdominal pain, no chest pain, no headaches and no rash     Past Medical History:  Diagnosis Date  . Abnormal pulmonary function test   . Anemia    H/H of 10/30 with a normal MCV in 12/09  . Anxiety   . Arthritis   . Barrett's esophagus    Diagnosed 1995. Last EGD 2016-NO BARRETT'S.   . Chest pain    Negative cardiac catheterization in 2002; negative stress nuclear study in 2008  . Chronic anticoagulation   . Chronic combined systolic and diastolic CHF (congestive heart failure) (HCC)    a. EF predominantly normal during prior echoes but was 40% during 10/2014 echo. b. Most recent 01/2015 EF normal, 55-60%.  . Chronic LBP    Surgical intervention in 1996  . Diabetes mellitus, type 2 (HCC)    Insulin therapy; exacerbated by prednisone  . Dysrhythmia    AFib  . Gastroparesis    99% retention 05/2008 on GES  . GERD (gastroesophageal reflux disease)   . Hiatal hernia   . Hyperlipidemia   . Hypertension   . Hypothyroid   . IBS (irritable bowel syndrome)   . Obesity   . OSA on CPAP    had CPAP and cannot tolerate.  . Paroxysmal atrial fibrillation (HCC)   .  Pulmonary hypertension (HCC) 01/2015   a. Predominantly pulmonary venous hypertension but may be component of PAH.  . Seizures (HCC)    last seizure was 2 years ago; on keppra for this; unknown etiology  . Syncope    a. Admitted 05/2009; magnetic resonance imagin/ MRA - negative; etiology thought to be orthostasis secondary to drugs and dehydration. b. Syncope 02/2015 also felt 2/2 dehydration.    Patient Active Problem List   Diagnosis Date Noted  . Chronic respiratory failure with hypoxia and hypercapnia (HCC) 12/13/2017  . Acute systolic HF (heart failure) (HCC) 11/26/2017  . Palliative care by specialist   . Goals of care, counseling/discussion   . Acute on chronic diastolic CHF (congestive heart failure) (HCC) 08/17/2017  . Acute respiratory failure with hypoxia and hypercapnia (HCC) 08/16/2017  . Uncontrolled type 2 diabetes mellitus with hyperglycemia (HCC) 08/16/2017  . Acute respiratory failure with hypercapnia (HCC)   . CAP Failed Outpatient Tx 08/05/2017  . COPD exacerbation (HCC) 08/05/2017  . Pulmonary hypertension (HCC) 04/05/2017  . Derangement of posterior horn of medial meniscus of right knee   . Lateral meniscus, anterior horn derangement, right   . Syncope, near 12/26/2015  . Fall at home 12/26/2015  . Dysphagia 12/07/2015  . IDA (iron deficiency anemia) 03/13/2015  . Chronic diastolic CHF (congestive heart  failure) (HCC) 02/20/2015  . Syncope and collapse 02/05/2015  . Dehydration   . Acute on chronic combined systolic and diastolic CHF (congestive heart failure) (HCC) 01/26/2015  . Low hemoglobin   . Diffuse abdominal pain 09/16/2014  . Ascites 08/27/2014  . Anemia 07/20/2014  . Epistaxis, recurrent 07/20/2014  . Syncope 04/11/2014  . Dementia (HCC) 04/11/2014  . Chronic diarrhea 04/11/2014  . DM type 2 (diabetes mellitus, type 2) (HCC) 04/11/2014  . Protein-calorie malnutrition, severe (HCC) 04/11/2014  . Acute CHF (HCC) 12/21/2013  . Atrial fibrillation  with RVR (HCC) 12/21/2013  . Chest pain at rest 12/03/2013  . Obesity 12/03/2013  . Chest pain 12/03/2013  . Diarrhea 08/26/2013  . Abnormal weight loss 08/26/2013  . Lower abdominal pain 08/26/2013  . Anorexia nervosa 08/26/2013  . Encounter for therapeutic drug monitoring 08/26/2013  . DOE (dyspnea on exertion) 06/29/2013  . Tremor 10/27/2012  . Chronic anticoagulation 06/23/2011  . HYPOTENSION, ORTHOSTATIC 08/26/2009  . Hyperlipidemia 03/04/2009  . Barrett's esophagus 09/26/2008  . Gastroparesis 09/26/2008  . Iron deficiency anemia 09/25/2008  . Essential hypertension 09/25/2008  . LUNG NODULE 09/25/2008  . Irritable bowel syndrome 09/25/2008  . Sleep apnea 09/25/2008  . CARPAL TUNNEL SYNDROME, HX OF 09/25/2008  . Hypothyroidism 04/10/2008  . DIABETES MELLITUS, TYPE II, ON INSULIN 04/10/2008  . OBESITY 04/10/2008  . Atrial fibrillation (HCC) 04/10/2008  . Congestive heart failure (HCC) 04/10/2008  . LOW BACK PAIN, CHRONIC 04/10/2008  . CHEST PAIN-PRECORDIAL 04/10/2008    Past Surgical History:  Procedure Laterality Date  . BACK SURGERY  1996  . BIOPSY N/A 11/08/2013   Procedure: BIOPSY  / Tissue sampling / ulcers present in small intestine;  Surgeon: West Bali, MD;  Location: [facility];  Service: Endoscopy;  Laterality: N/A;  . CARDIAC CATHETERIZATION  2002  . CARDIAC CATHETERIZATION N/A 01/26/2015   Procedure: Right Heart Cath;  Surgeon: Laurey Morale, MD;  Location: [facility] INVASIVE CV LAB;  Service: Cardiovascular;  Laterality: N/A;  . CARDIOVASCULAR STRESS TEST  2008   Stress nuclear study  . CARDIOVERSION N/A 03/06/2015   Procedure: CARDIOVERSION;  Surgeon: Laurey Morale, MD;  Location: [facility] ENDOSCOPY;  Service: Cardiovascular;  Laterality: N/A;  . CARPAL TUNNEL RELEASE  1994  . COLONOSCOPY  11/2011   Dr. Darrick Penna: Internal hemorrhoids, mild diverticulosis. Random colon biopsies negative.  . COLONOSCOPY N/A 11/08/2013   SLF: Normal mucosa in the terminal ileum/The  colon IS redundant/  Moderate diverticulosis throughout the entire colon. ileum bx benign. colon bx benign  . ESOPHAGOGASTRODUODENOSCOPY  2008   Barrett's without dysplasia. esphagus dilated. antral erosions, h.pylori serologies negative.  . ESOPHAGOGASTRODUODENOSCOPY  11/2011   Dr. Darrick Penna: Barrett's esophagus, mild gastritis, diverticulum in the second portion of the duodenum repeat EGD 3 years. Small bowel biopsies negative. Gastric biopsy show reactive gastropathy but no H. pylori. Esophageal biopsies consistent with GERD. Next EGD 11/2014  . ESOPHAGOGASTRODUODENOSCOPY N/A 11/21/2014   ZOX:WRUE non-erosive gastritis/irregular z-line  . GIVENS CAPSULE STUDY  12/07/2011   Proximal small bowel, rare AVM. Distal small bowel, multiple ulcers noted  . GIVENS CAPSULE STUDY N/A 09/27/2013   Distal small bowel ulcers extending to TI.  Marland Kitchen GIVENS CAPSULE STUDY N/A 10/10/2013   Procedure: GIVENS CAPSULE STUDY;  Surgeon: West Bali, MD;  Location: [facility];  Service: Endoscopy;  Laterality: N/A;  7:30  . IR KYPHO THORACIC WITH BONE BIOPSY  02/09/2018  . KNEE ARTHROSCOPY WITH MEDIAL MENISECTOMY Right 06/09/2016   Procedure: KNEE  ARTHROSCOPY WITH MEDIAL MENISECTOMY;  Surgeon: Vickki Hearing, MD;  Location: [facility] ORS;  Service: Orthopedics;  Laterality: Right;  medial and lateral menisectomy  . LAMINECTOMY  1995   L4-L5  . LAPAROSCOPIC CHOLECYSTECTOMY  1990s  . LEFT HEART CATHETERIZATION WITH CORONARY ANGIOGRAM  01/10/2014   Procedure: LEFT HEART CATHETERIZATION WITH CORONARY ANGIOGRAM;  Surgeon: Lesleigh Noe, MD;  Location: [facility] CATH LAB;  Service: Cardiovascular;;  . RIGHT HEART CATHETERIZATION N/A 01/10/2014   Procedure: RIGHT HEART CATH;  Surgeon: Lesleigh Noe, MD;  Location: [facility] CATH LAB;  Service: Cardiovascular;  Laterality: N/A;  . TOTAL ABDOMINAL HYSTERECTOMY  1999  . TRACHEOSTOMY TUBE PLACEMENT N/A 09/01/2017   Procedure: TRACHEOSTOMY;  Surgeon: Drema Halon, MD;  Location: [facility] OR;   Service: ENT;  Laterality: N/A;     OB History    Gravida  2   Para  2   Term  2   Preterm      AB      Living        SAB      TAB      Ectopic      Multiple      Live Births               Home Medications    Prior to Admission medications   Medication Sig Start Date End Date Taking? Authorizing Provider  acetaminophen (TYLENOL) 500 MG tablet Take 500 mg by mouth every 6 (six) hours as needed for moderate pain or headache.   Yes [provider]  amiodarone (PACERONE) 200 MG tablet TAKE ONE TABLET BY MOUTH EVERY MORNING Patient taking differently: Take 200 mg by mouth daily.  04/23/18  Yes Branch, Dorothe Pea, MD  apixaban (ELIQUIS) 5 MG TABS tablet Take 1 tablet (5 mg total) by mouth 2 (two) times daily. 06/09/16  Yes Vickki Hearing, MD  Cholecalciferol (VITAMIN D3) 2000 units capsule Take 2,000 Units by mouth daily. 10/25/17  Yes [provider]  dicyclomine (BENTYL) 10 MG capsule Take 2 capsules (20 mg total) by mouth 2 (two) times daily. 04/25/18  Yes Gelene Mink, NP  doxycycline (VIBRAMYCIN) 100 MG capsule Take 1 capsule (100 mg total) by mouth 2 (two) times daily. 08/25/18  Yes Donnetta Hutching, MD  DULoxetine (CYMBALTA) 30 MG capsule Take 30 mg by mouth 2 (two) times daily. 11/24/17  Yes [provider]  gabapentin (NEURONTIN) 400 MG capsule Take 400-800 mg by mouth See admin instructions. Take 1 capsule(400mg ) by mouth twice a day and take 2 capsules(800mg ) by mouth at bedtime 07/18/17  Yes [provider]  glipiZIDE (GLUCOTROL) 10 MG tablet Take 10 mg by mouth 2 (two) times daily. 11/24/17  Yes [provider]  HYDROcodone-acetaminophen (NORCO) 10-325 MG tablet Take 1 tablet by mouth 4 (four) times daily. 07/02/18  Yes [provider]  insulin aspart (NOVOLOG) 100 UNIT/ML injection Inject 0-20 Units into the skin every 4 (four) hours. CBG 0-120--no units; 121-150--3 units; 151-200--4 units; 201-250--7units;  251-300--11 units; 301-350--15 units; 351-400--20 units 08/22/17  Yes Tat, David, MD  levETIRAcetam (KEPPRA) 1000 MG tablet Take 1,500 mg by mouth 2 (two) times daily.    Yes [provider]  levothyroxine (SYNTHROID, LEVOTHROID) 137 MCG tablet Take 137 mcg by mouth daily. 10/14/14  Yes [provider]  loperamide (IMODIUM) 2 MG capsule TAKE ONE CAPSULE BY MOUTH TWICE DAILY AS NEEDED Patient taking differently: Take 2 mg by mouth 2 (two) times daily  as needed for diarrhea or loose stools.  06/20/18  Yes Anice PaganiniGill, Eric A, NP  LORazepam (ATIVAN) 1 MG tablet Take 1 mg by mouth 3 (three) times daily as needed. for anxiety 11/24/17  Yes [provider]  magnesium oxide (MAG-OX) 400 MG tablet Take 1 tablet (400 mg total) by mouth 2 (two) times daily. 12/26/17  Yes BranchDorothe Pea, Jonathan F, MD  meclizine (ANTIVERT) 25 MG tablet Take 25 mg by mouth daily.  05/21/18  Yes [provider]  metFORMIN (GLUCOPHAGE) 500 MG tablet Take 500 mg by mouth 2 (two) times daily with a meal.   Yes [provider]  Multiple Vitamin (MULTIVITAMIN WITH MINERALS) TABS tablet Take 1 tablet by mouth daily.   Yes [provider]  naproxen sodium (ALEVE) 220 MG tablet Take 440 mg by mouth as needed (pain).    Yes [provider]  nystatin (MYCOSTATIN/NYSTOP) powder Apply topically 2 (two) times daily. To affected area. Keep dry. Patient taking differently: Apply 1 Bottle topically 2 (two) times daily as needed. To affected area. Keep dry. 11/24/17  Yes Dunn, Dayna N, PA-C  ondansetron (ZOFRAN) 4 MG tablet TAKE 1 TABLET BY MOUTH FOUR TIMES DAILY AS NEEDED FOR NAUSEA Patient taking differently: Take 4 mg by mouth 4 (four) times daily as needed for nausea.  01/10/18  Yes Gelene MinkBoone, Anna W, NP  OXYGEN 2lpm with sleep and as needed during the day   Yes [provider]  pantoprazole (PROTONIX) 40 MG tablet TAKE ONE TABLET BY MOUTH TWICE DAILY BEFORE APPLY MEAL Patient taking  differently: Take 40 mg by mouth 2 (two) times daily.  02/07/17  Yes Gelene MinkBoone, Anna W, NP  potassium chloride (K-DUR,KLOR-CON) 10 MEQ tablet TAKE TWO TABLETS BY MOUTH TWICE DAILY Patient taking differently: Take 20 mEq by mouth 2 (two) times daily.  08/08/18  Yes Branch, Dorothe PeaJonathan F, MD  predniSONE (DELTASONE) 10 MG tablet Take 1 tablet (10 mg total) by mouth daily with breakfast. 2 tablets for 4 days, 1 tablet for 4 days, 1/2 tablet for 4 days 08/25/18  Yes Donnetta Hutchingook, Brian, MD  torsemide (DEMADEX) 20 MG tablet TAKE TWO TABLETS BY MOUTH IN THE MORNING AND TAKE 1 TABLET IN THE EVENING EVERY DAY (MAY TAKE ADDITIONAL TABLET AS NEEDED FOR SWELLING Patient taking differently: Take 20-40 mg by mouth See admin instructions. TAKE TWO TABLETS BY MOUTH IN THE MORNING AND TAKE 1 TABLET IN THE EVENING EVERY DAY (MAY TAKE ADDITIONAL TABLET AS NEEDED FOR SWELLING 08/08/18  Yes Branch, Dorothe PeaJonathan F, MD  insulin glargine (LANTUS) 100 UNIT/ML injection Inject 0.55 mLs (55 Units total) into the skin at bedtime. Patient not taking: Reported on 08/31/2018 08/22/17   Catarina Hartshornat, David, MD    Family History Family History  Problem Relation Age of Onset  . Hypertension Mother   . Alzheimer's disease Mother   . Stroke Mother   . Heart attack Mother   . Hypertension Other   . Breast cancer Sister   . Heart disease Neg Hx   . Colon cancer Neg Hx     Social History Social History   Tobacco Use  . Smoking status: Former Smoker    Packs/day: 0.25    Years: 15.00    Pack years: 3.75    Types: Cigarettes    Start date: 02/26/1966    Last attempt to quit: 07/01/1983    Years since quitting: 35.1  . Smokeless tobacco: Never Used  . Tobacco comment: quit in 1984  Substance Use Topics  .  Alcohol use: No    Alcohol/week: 0.0 standard drinks  . Drug use: No     Allergies   Celexa [citalopram hydrobromide]; Codeine; Dilaudid [hydromorphone hcl]; and Reglan [metoclopramide]   Review of Systems Review of Systems  Constitutional:  Negative for appetite change and fatigue.  HENT: Negative for congestion, ear discharge and sinus pressure.   Eyes: Negative for discharge.  Respiratory: Positive for cough and shortness of breath.   Cardiovascular: Negative for chest pain.  Gastrointestinal: Negative for abdominal pain and diarrhea.  Genitourinary: Negative for frequency and hematuria.  Musculoskeletal: Negative for back pain.  Skin: Negative for rash.  Neurological: Negative for seizures and headaches.  Psychiatric/Behavioral: Negative for hallucinations.     Physical Exam Updated Vital Signs BP (!) 110/92 (BP Location: Right Arm)   Pulse 60   Temp 98.6 F (37 C) (Oral)   Resp 18   Ht 5\' 6"  (1.676 m)   Wt 106.6 kg   SpO2 99%   BMI 37.93 kg/m   Physical Exam Vitals signs and nursing note reviewed.  Constitutional:      Appearance: She is well-developed.  HENT:     Head: Normocephalic.     Nose: Nose normal.  Eyes:     General: No scleral icterus.    Conjunctiva/sclera: Conjunctivae normal.  Neck:     Musculoskeletal: Neck supple.     Thyroid: No thyromegaly.  Cardiovascular:     Rate and Rhythm: Normal rate and regular rhythm.     Heart sounds: No murmur. No friction rub. No gallop.   Pulmonary:     Breath sounds: No stridor. No wheezing or rales.  Chest:     Chest wall: No tenderness.  Abdominal:     General: There is no distension.     Tenderness: There is no abdominal tenderness. There is no rebound.  Musculoskeletal: Normal range of motion.  Lymphadenopathy:     Cervical: No cervical adenopathy.  Skin:    Findings: No erythema or rash.  Neurological:     Mental Status: She is oriented to person, place, and time.     Motor: No abnormal muscle tone.     Coordination: Coordination normal.  Psychiatric:        Behavior: Behavior normal.      ED Treatments / Results  Labs (all labs ordered are listed, but only abnormal results are displayed) Labs Reviewed  BASIC METABOLIC PANEL -  Abnormal; Notable for the following components:      Result Value   Glucose, Bld 165 (*)    All other components within normal limits  CBC - Abnormal; Notable for the following components:   MCV 105.9 (*)    All other components within normal limits  URINALYSIS, ROUTINE W REFLEX MICROSCOPIC  I-STAT TROPONIN, ED    EKG EKG Interpretation  Date/Time:  Friday August 31 2018 12:52:45 EST Ventricular Rate:  60 PR Interval:  212 QRS Duration: 104 QT Interval:  466 QTC Calculation: 466 R Axis:   -48 Text Interpretation:  Sinus rhythm with 1st degree A-V block Left anterior fascicular block Abnormal ECG Confirmed by Bethann Berkshire [phone]) on 08/31/2018 4:36:14 PM   Radiology Dg Chest 2 View  Result Date: 08/31/2018 CLINICAL DATA:  68 y/o F; shortness of breath, dizziness, personality changes. EXAM: CHEST - 2 VIEW COMPARISON:  08/25/2018 chest radiograph FINDINGS: Increasing consolidation in the left lung base. Stable background of interstitial opacities probably representing vascular congestion. Stable cardiomegaly given projection and technique. Aortic calcific atherosclerosis.  Stable thoracolumbar mild compression deformities post augmentation. No acute osseous abnormality is evident. IMPRESSION: Increasing consolidation in the left lung base, suspected pneumonia. Stable background of interstitial opacities, probably vascular congestion. Electronically Signed   By: Mitzi Hansen M.D.   On: 08/31/2018 14:18    Procedures Procedures (including critical care time)  Medications Ordered in ED Medications  magnesium sulfate IVPB 2 g 50 mL (2 g Intravenous New Bag/Given 08/31/18 1646)  levofloxacin (LEVAQUIN) IVPB 750 mg (has no administration in time range)  sodium chloride flush (NS) 0.9 % injection 3 mL (3 mLs Intravenous Given 08/31/18 1524)  ipratropium-albuterol (DUONEB) 0.5-2.5 (3) MG/3ML nebulizer solution 3 mL (3 mLs Nebulization Given 08/31/18 1536)  albuterol (PROVENTIL)  (2.5 MG/3ML) 0.083% nebulizer solution 2.5 mg (2.5 mg Nebulization Given 08/31/18 1536)  methylPREDNISolone sodium succinate (SOLU-MEDROL) 125 mg/2 mL injection 125 mg (125 mg Intravenous Given 08/31/18 1643)  acetaminophen (TYLENOL) tablet 650 mg (650 mg Oral Given 08/31/18 1703)     Initial Impression / Assessment and Plan / ED Course  I have reviewed the triage vital signs and the nursing notes.  Pertinent labs & imaging results that were available during my care of the patient were reviewed by me and considered in my medical decision making (see chart for details).       Patient with pneumonia that has failed outpatient treatment.  She will be admitted to the hospital for IV antibiotics Final Clinical Impressions(s) / ED Diagnoses   Final diagnoses:  Community acquired pneumonia of right lower lobe of lung [facility])    ED Discharge Orders    None       Bethann Berkshire, MD 08/31/18 1710

## 2018-08-31 NOTE — H&P (Signed)
History and Physical    [name] ZOX:[id number] DOB: 05-Nov-1950 DOA: 08/31/2018  PCP: Gareth Morgan, MD  Patient coming from: Home  Chief Complaint: Shortness of breath  HPI: [name] is a 68 y.o. female with medical history significant of COPD with home O2 of 2L PRN, paroxysmal atrial fibrillation, chronic combined systolic diastolic heart failure, depression, diabetes, seizure disorder, hypothyroidism who presents with symptoms of worsening shortness of breath.  She was diagnosed with pneumonia as an outpatient, was started on doxycycline about 6 days ago.  Despite being on antibiotics, she has not clinically improved.  She denies any fevers at home, no chest pain.  She does admit to productive cough of yellow sputum.  No nausea, vomiting.  She did have some loose stools over the past few days.  She denies any international travel, no close contacts with international travel history.  ED Course: CXR revealed increased consolidation left lung base. Started on levaquin, steroids, neb tx.   Review of Systems: As per HPI otherwise 10 point review of systems negative.   Past Medical History:  Diagnosis Date  . Abnormal pulmonary function test   . Anemia    H/H of 10/30 with a normal MCV in 12/09  . Anxiety   . Arthritis   . Barrett's esophagus    Diagnosed 1995. Last EGD 2016-NO BARRETT'S.   . Chest pain    Negative cardiac catheterization in 2002; negative stress nuclear study in 2008  . Chronic anticoagulation   . Chronic combined systolic and diastolic CHF (congestive heart failure) (HCC)    a. EF predominantly normal during prior echoes but was 40% during 10/2014 echo. b. Most recent 01/2015 EF normal, 55-60%.  . Chronic LBP    Surgical intervention in 1996  . Diabetes mellitus, type 2 (HCC)    Insulin therapy; exacerbated by prednisone  . Dysrhythmia    AFib  . Gastroparesis    99% retention 05/2008 on GES  . GERD (gastroesophageal reflux disease)   . Hiatal hernia     . Hyperlipidemia   . Hypertension   . Hypothyroid   . IBS (irritable bowel syndrome)   . Obesity   . OSA on CPAP    had CPAP and cannot tolerate.  . Paroxysmal atrial fibrillation (HCC)   . Pulmonary hypertension (HCC) 01/2015   a. Predominantly pulmonary venous hypertension but may be component of PAH.  . Seizures (HCC)    last seizure was 2 years ago; on keppra for this; unknown etiology  . Syncope    a. Admitted 05/2009; magnetic resonance imagin/ MRA - negative; etiology thought to be orthostasis secondary to drugs and dehydration. b. Syncope 02/2015 also felt 2/2 dehydration.    Past Surgical History:  Procedure Laterality Date  . BACK SURGERY  1996  . BIOPSY N/A 11/08/2013   Procedure: BIOPSY  / Tissue sampling / ulcers present in small intestine;  Surgeon: West Bali, MD;  Location: [facility];  Service: Endoscopy;  Laterality: N/A;  . CARDIAC CATHETERIZATION  2002  . CARDIAC CATHETERIZATION N/A 01/26/2015   Procedure: Right Heart Cath;  Surgeon: Laurey Morale, MD;  Location: [facility] INVASIVE CV LAB;  Service: Cardiovascular;  Laterality: N/A;  . CARDIOVASCULAR STRESS TEST  2008   Stress nuclear study  . CARDIOVERSION N/A 03/06/2015   Procedure: CARDIOVERSION;  Surgeon: Laurey Morale, MD;  Location: [facility] ENDOSCOPY;  Service: Cardiovascular;  Laterality: N/A;  . CARPAL TUNNEL RELEASE  1994  . COLONOSCOPY  11/2011  Dr. Darrick Penna: Internal hemorrhoids, mild diverticulosis. Random colon biopsies negative.  . COLONOSCOPY N/A 11/08/2013   SLF: Normal mucosa in the terminal ileum/The colon IS redundant/  Moderate diverticulosis throughout the entire colon. ileum bx benign. colon bx benign  . ESOPHAGOGASTRODUODENOSCOPY  2008   Barrett's without dysplasia. esphagus dilated. antral erosions, h.pylori serologies negative.  . ESOPHAGOGASTRODUODENOSCOPY  11/2011   Dr. Darrick Penna: Barrett's esophagus, mild gastritis, diverticulum in the second portion of the duodenum repeat EGD 3 years. Small bowel  biopsies negative. Gastric biopsy show reactive gastropathy but no H. pylori. Esophageal biopsies consistent with GERD. Next EGD 11/2014  . ESOPHAGOGASTRODUODENOSCOPY N/A 11/21/2014   OLM:BEML non-erosive gastritis/irregular z-line  . GIVENS CAPSULE STUDY  12/07/2011   Proximal small bowel, rare AVM. Distal small bowel, multiple ulcers noted  . GIVENS CAPSULE STUDY N/A 09/27/2013   Distal small bowel ulcers extending to TI.  Marland Kitchen GIVENS CAPSULE STUDY N/A 10/10/2013   Procedure: GIVENS CAPSULE STUDY;  Surgeon: West Bali, MD;  Location: [facility];  Service: Endoscopy;  Laterality: N/A;  7:30  . IR KYPHO THORACIC WITH BONE BIOPSY  02/09/2018  . KNEE ARTHROSCOPY WITH MEDIAL MENISECTOMY Right 06/09/2016   Procedure: KNEE ARTHROSCOPY WITH MEDIAL MENISECTOMY;  Surgeon: Vickki Hearing, MD;  Location: [facility] ORS;  Service: Orthopedics;  Laterality: Right;  medial and lateral menisectomy  . LAMINECTOMY  1995   L4-L5  . LAPAROSCOPIC CHOLECYSTECTOMY  1990s  . LEFT HEART CATHETERIZATION WITH CORONARY ANGIOGRAM  01/10/2014   Procedure: LEFT HEART CATHETERIZATION WITH CORONARY ANGIOGRAM;  Surgeon: Lesleigh Noe, MD;  Location: [facility] CATH LAB;  Service: Cardiovascular;;  . RIGHT HEART CATHETERIZATION N/A 01/10/2014   Procedure: RIGHT HEART CATH;  Surgeon: Lesleigh Noe, MD;  Location: [facility] CATH LAB;  Service: Cardiovascular;  Laterality: N/A;  . TOTAL ABDOMINAL HYSTERECTOMY  1999  . TRACHEOSTOMY TUBE PLACEMENT N/A 09/01/2017   Procedure: TRACHEOSTOMY;  Surgeon: Drema Halon, MD;  Location: [facility] OR;  Service: ENT;  Laterality: N/A;     reports that she quit smoking about 35 years ago. Her smoking use included cigarettes. She started smoking about 52 years ago. She has a 3.75 pack-year smoking history. She has never used smokeless tobacco. She reports that she does not drink alcohol or use drugs.  Allergies  Allergen Reactions  . Celexa [Citalopram Hydrobromide] Other (See Comments)    Dyskinesia  .  Codeine Nausea And Vomiting and Other (See Comments)    HALLUCINATIONS  . Dilaudid [Hydromorphone Hcl] Other (See Comments)    Made her pass out  . Reglan [Metoclopramide] Other (See Comments)    DYSKINESIA    Family History  Problem Relation Age of Onset  . Hypertension Mother   . Alzheimer's disease Mother   . Stroke Mother   . Heart attack Mother   . Hypertension Other   . Breast cancer Sister   . Heart disease Neg Hx   . Colon cancer Neg Hx     Prior to Admission medications   Medication Sig Start Date End Date Taking? Authorizing Provider  acetaminophen (TYLENOL) 500 MG tablet Take 500 mg by mouth every 6 (six) hours as needed for moderate pain or headache.   Yes [provider]  amiodarone (PACERONE) 200 MG tablet TAKE ONE TABLET BY MOUTH EVERY MORNING Patient taking differently: Take 200 mg by mouth daily.  04/23/18  Yes Branch, Dorothe Pea, MD  apixaban (ELIQUIS) 5 MG TABS tablet Take 1 tablet (5 mg total) by mouth  2 (two) times daily. 06/09/16  Yes Vickki HearingHarrison, Stanley E, MD  Cholecalciferol (VITAMIN D3) 2000 units capsule Take 2,000 Units by mouth daily. 10/25/17  Yes [provider]  dicyclomine (BENTYL) 10 MG capsule Take 2 capsules (20 mg total) by mouth 2 (two) times daily. 04/25/18  Yes Gelene MinkBoone, Anna W, NP  doxycycline (VIBRAMYCIN) 100 MG capsule Take 1 capsule (100 mg total) by mouth 2 (two) times daily. 08/25/18  Yes Donnetta Hutchingook, Brian, MD  DULoxetine (CYMBALTA) 30 MG capsule Take 30 mg by mouth 2 (two) times daily. 11/24/17  Yes [provider]  gabapentin (NEURONTIN) 400 MG capsule Take 400-800 mg by mouth See admin instructions. Take 1 capsule(400mg ) by mouth twice a day and take 2 capsules(800mg ) by mouth at bedtime 07/18/17  Yes [provider]  glipiZIDE (GLUCOTROL) 10 MG tablet Take 10 mg by mouth 2 (two) times daily. 11/24/17  Yes [provider]  HYDROcodone-acetaminophen (NORCO) 10-325 MG tablet Take 1 tablet by mouth 4 (four) times  daily. 07/02/18  Yes [provider]  insulin aspart (NOVOLOG) 100 UNIT/ML injection Inject 0-20 Units into the skin every 4 (four) hours. CBG 0-120--no units; 121-150--3 units; 151-200--4 units; 201-250--7units; 251-300--11 units; 301-350--15 units; 351-400--20 units 08/22/17  Yes Tat, David, MD  levETIRAcetam (KEPPRA) 1000 MG tablet Take 1,500 mg by mouth 2 (two) times daily.    Yes [provider]  levothyroxine (SYNTHROID, LEVOTHROID) 137 MCG tablet Take 137 mcg by mouth daily. 10/14/14  Yes [provider]  loperamide (IMODIUM) 2 MG capsule TAKE ONE CAPSULE BY MOUTH TWICE DAILY AS NEEDED Patient taking differently: Take 2 mg by mouth 2 (two) times daily as needed for diarrhea or loose stools.  06/20/18  Yes Anice PaganiniGill, Eric A, NP  LORazepam (ATIVAN) 1 MG tablet Take 1 mg by mouth 3 (three) times daily as needed. for anxiety 11/24/17  Yes [provider]  magnesium oxide (MAG-OX) 400 MG tablet Take 1 tablet (400 mg total) by mouth 2 (two) times daily. 12/26/17  Yes BranchDorothe Pea, Jonathan F, MD  meclizine (ANTIVERT) 25 MG tablet Take 25 mg by mouth daily.  05/21/18  Yes [provider]  metFORMIN (GLUCOPHAGE) 500 MG tablet Take 500 mg by mouth 2 (two) times daily with a meal.   Yes [provider]  Multiple Vitamin (MULTIVITAMIN WITH MINERALS) TABS tablet Take 1 tablet by mouth daily.   Yes [provider]  naproxen sodium (ALEVE) 220 MG tablet Take 440 mg by mouth as needed (pain).    Yes [provider]  nystatin (MYCOSTATIN/NYSTOP) powder Apply topically 2 (two) times daily. To affected area. Keep dry. Patient taking differently: Apply 1 Bottle topically 2 (two) times daily as needed. To affected area. Keep dry. 11/24/17  Yes Dunn, Dayna N, PA-C  ondansetron (ZOFRAN) 4 MG tablet TAKE 1 TABLET BY MOUTH FOUR TIMES DAILY AS NEEDED FOR NAUSEA Patient taking differently: Take 4 mg by mouth 4 (four) times daily as needed for nausea.  01/10/18  Yes  Gelene MinkBoone, Anna W, NP  OXYGEN 2lpm with sleep and as needed during the day   Yes [provider]  pantoprazole (PROTONIX) 40 MG tablet TAKE ONE TABLET BY MOUTH TWICE DAILY BEFORE APPLY MEAL Patient taking differently: Take 40 mg by mouth 2 (two) times daily.  02/07/17  Yes Gelene MinkBoone, Anna W, NP  potassium chloride (K-DUR,KLOR-CON) 10 MEQ tablet TAKE TWO TABLETS BY MOUTH TWICE DAILY Patient taking differently: Take 20 mEq by mouth 2 (two) times daily.  08/08/18  Yes Branch, Dorothe Pea, MD  predniSONE (DELTASONE) 10 MG tablet Take 1 tablet (10 mg total) by mouth daily with breakfast. 2 tablets for 4 days, 1 tablet for 4 days, 1/2 tablet for 4 days 08/25/18  Yes Donnetta Hutching, MD  torsemide (DEMADEX) 20 MG tablet TAKE TWO TABLETS BY MOUTH IN THE MORNING AND TAKE 1 TABLET IN THE EVENING EVERY DAY (MAY TAKE ADDITIONAL TABLET AS NEEDED FOR SWELLING Patient taking differently: Take 20-40 mg by mouth See admin instructions. TAKE TWO TABLETS BY MOUTH IN THE MORNING AND TAKE 1 TABLET IN THE EVENING EVERY DAY (MAY TAKE ADDITIONAL TABLET AS NEEDED FOR SWELLING 08/08/18  Yes Branch, Dorothe Pea, MD  insulin glargine (LANTUS) 100 UNIT/ML injection Inject 0.55 mLs (55 Units total) into the skin at bedtime. Patient not taking: Reported on 08/31/2018 08/22/17   Catarina Hartshorn, MD    Physical Exam: Vitals:   08/31/18 1250  BP: (!) 110/92  Pulse: 60  Resp: 18  Temp: 98.6 F (37 C)  TempSrc: Oral  SpO2: 99%  Weight: 106.6 kg  Height: 5\' 6"  (1.676 m)     Constitutional: NAD, calm, comfortable Eyes: PERRL, lids and conjunctivae normal ENMT: Mucous membranes are moist. Posterior pharynx clear of any exudate or lesions.Normal dentition.  Neck: normal, supple, no masses, no thyromegaly Respiratory: Bilateral expiratory wheezes, crackles over the left base.  On nasal cannula O2.  No respiratory distress on nasal cannula O2 Cardiovascular: Regular rate and rhythm, no murmurs / rubs / gallops. No extremity edema.  Abdomen:  no tenderness, no masses palpated. No hepatosplenomegaly. Bowel sounds positive.  Musculoskeletal: no clubbing / cyanosis. No joint deformity upper and lower extremities. Good ROM, no contractures. Normal muscle tone.  Skin: no rashes, lesions, ulcers. No induration Neurologic: CN 2-12 grossly intact. Strength 5/5 in all 4.  Psychiatric: Normal judgment and insight.    Labs on Admission: I have personally reviewed following labs and imaging studies  CBC: Recent Labs  Lab 08/31/18 1307  WBC 4.8  HGB 12.5  HCT 41.5  MCV 105.9*  PLT 154   Basic Metabolic Panel: Recent Labs  Lab 08/31/18 1307  NA 138  K 5.1  CL 105  CO2 26  GLUCOSE 165*  BUN 15  CREATININE 0.77  CALCIUM 8.9   GFR: Estimated Creatinine Clearance: 81.9 mL/min (by C-G formula based on SCr of 0.77 mg/dL). Liver Function Tests: No results for input(s): AST, ALT, ALKPHOS, BILITOT, PROT, ALBUMIN in the last 168 hours. No results for input(s): LIPASE, AMYLASE in the last 168 hours. No results for input(s): AMMONIA in the last 168 hours. Coagulation Profile: No results for input(s): INR, PROTIME in the last 168 hours. Cardiac Enzymes: No results for input(s): CKTOTAL, CKMB, CKMBINDEX, TROPONINI in the last 168 hours. BNP (last 3 results) No results for input(s): PROBNP in the last 8760 hours. HbA1C: No results for input(s): HGBA1C in the last 72 hours. CBG: No results for input(s): GLUCAP in the last 168 hours. Lipid Profile: No results for input(s): CHOL, HDL, LDLCALC, TRIG, CHOLHDL, LDLDIRECT in the last 72 hours. Thyroid Function Tests: No results for input(s): TSH, T4TOTAL, FREET4, T3FREE, THYROIDAB in the last 72 hours. Anemia Panel: No results for input(s): VITAMINB12, FOLATE, FERRITIN, TIBC, IRON, RETICCTPCT in the last 72 hours. Urine analysis:    Component Value Date/Time   COLORURINE YELLOW 09/15/2017 0348   APPEARANCEUR HAZY (A) 09/15/2017 0348   LABSPEC 1.016 09/15/2017 0348   PHURINE 6.0  09/15/2017 0348   GLUCOSEU NEGATIVE  09/15/2017 0348   HGBUR NEGATIVE 09/15/2017 0348   BILIRUBINUR NEGATIVE 09/15/2017 0348   KETONESUR NEGATIVE 09/15/2017 0348   PROTEINUR NEGATIVE 09/15/2017 0348   UROBILINOGEN 0.2 02/05/2015 1700   NITRITE NEGATIVE 09/15/2017 0348   LEUKOCYTESUR NEGATIVE 09/15/2017 0348   Sepsis Labs: !!!!!!!!!!!!!!!!!!!!!!!!!!!!!!!!!!!!!!!!!!!! @LABRCNTIP (procalcitonin:4,lacticidven:4) )No results found for this or any previous visit (from the past 240 hour(s)).   Radiological Exams on Admission: Dg Chest 2 View  Result Date: 08/31/2018 CLINICAL DATA:  68 y/o F; shortness of breath, dizziness, personality changes. EXAM: CHEST - 2 VIEW COMPARISON:  08/25/2018 chest radiograph FINDINGS: Increasing consolidation in the left lung base. Stable background of interstitial opacities probably representing vascular congestion. Stable cardiomegaly given projection and technique. Aortic calcific atherosclerosis. Stable thoracolumbar mild compression deformities post augmentation. No acute osseous abnormality is evident. IMPRESSION: Increasing consolidation in the left lung base, suspected pneumonia. Stable background of interstitial opacities, probably vascular congestion. Electronically Signed   By: Mitzi Hansen M.D.   On: 08/31/2018 14:18    EKG: Independently reviewed. Normal sinus rhythm with left axis deviation   Assessment/Plan Principal Problem:   CAP (community acquired pneumonia) Active Problems:   Hypothyroidism   Diabetes mellitus type 2 in obese (HCC)   Atrial fibrillation (HCC)   Congestive heart failure (HCC)   DM type 2 (diabetes mellitus, type 2) (HCC)   COPD exacerbation (HCC)   Depression   Anxiety   CAP, left lower base Rocephin, azithromycin Check influenza PCR   Acute COPD exacerbation with chronic hypoxemic/hypercarbic respiratory failure Uses 2 L nasal cannula O2 as needed at baseline Continue IV Solu-Medrol, nebs  Paroxysmal A.  fib Continue Pacerone, Eliquis  Chronic combined systolic and diastolic heart failure Without acute exacerbation, does not appear to be fluid overloaded on examination Hold Demadex  Depression Continue Cymbalta, Ativan  Diabetes mellitus Hold glipizide, metformin Sliding-scale insulin  Seizure disorder Continue Keppra  Hypothyroidism Continue Synthroid  GERD Continue PPI      DVT prophylaxis: Eliquis Code Status: FULL  Family Communication: No family at bedside Disposition Plan: Pending improvement  Consults called: None Admission status: Inpatient  Severity of Illness: The appropriate patient status for this patient is INPATIENT. Inpatient status is judged to be reasonable and necessary in order to provide the required intensity of service to ensure the patient's safety. The patient's presenting symptoms, physical exam findings, and initial radiographic and laboratory data in the context of their chronic comorbidities is felt to place them at high risk for further clinical deterioration. Furthermore, it is not anticipated that the patient will be medically stable for discharge from the hospital within 2 midnights of admission. The following factors support the patient status of inpatient.   " The patient's presenting symptoms include shortness of breath, productive cough. " The worrisome physical exam findings include wheezes, crackles LLL. " The initial radiographic and laboratory data are worrisome because of worsening infiltrate. " The chronic co-morbidities include Diabetes, heart failure, A. fib, COPD, chronic respiratory failure.   * I certify that at the point of admission it is my clinical judgment that the patient will require inpatient hospital care spanning beyond 2 midnights from the point of admission due to high intensity of service, high risk for further deterioration and high frequency of surveillance required.Noralee Stain, DO [facility] 08/31/2018, 5:44 PM

## 2018-08-31 NOTE — ED Triage Notes (Signed)
Pt arrives pov with daughter with concern for decreased oxygen saturation due to patient having "erractic behavioral" per daughter pt will became angry and not act herself. Daughter concerned her oxygen level made be low.

## 2018-09-01 DIAGNOSIS — J441 Chronic obstructive pulmonary disease with (acute) exacerbation: Secondary | ICD-10-CM

## 2018-09-01 DIAGNOSIS — E669 Obesity, unspecified: Secondary | ICD-10-CM

## 2018-09-01 DIAGNOSIS — J101 Influenza due to other identified influenza virus with other respiratory manifestations: Secondary | ICD-10-CM

## 2018-09-01 DIAGNOSIS — E1169 Type 2 diabetes mellitus with other specified complication: Secondary | ICD-10-CM

## 2018-09-01 DIAGNOSIS — E038 Other specified hypothyroidism: Secondary | ICD-10-CM

## 2018-09-01 DIAGNOSIS — I4891 Unspecified atrial fibrillation: Secondary | ICD-10-CM

## 2018-09-01 LAB — CBC
HCT: 39.5 % (ref 36.0–46.0)
Hemoglobin: 12.4 g/dL (ref 12.0–15.0)
MCH: 32.3 pg (ref 26.0–34.0)
MCHC: 31.4 g/dL (ref 30.0–36.0)
MCV: 102.9 fL — ABNORMAL HIGH (ref 80.0–100.0)
Platelets: 157 10*3/uL (ref 150–400)
RBC: 3.84 MIL/uL — ABNORMAL LOW (ref 3.87–5.11)
RDW: 14.4 % (ref 11.5–15.5)
WBC: 4.8 10*3/uL (ref 4.0–10.5)
nRBC: 0 % (ref 0.0–0.2)

## 2018-09-01 LAB — GLUCOSE, CAPILLARY
Glucose-Capillary: 257 mg/dL — ABNORMAL HIGH (ref 70–99)
Glucose-Capillary: 249 mg/dL — ABNORMAL HIGH (ref 70–99)
Glucose-Capillary: 176 mg/dL — ABNORMAL HIGH (ref 70–99)
Glucose-Capillary: 108 mg/dL — ABNORMAL HIGH (ref 70–99)

## 2018-09-01 LAB — BASIC METABOLIC PANEL
Anion gap: 11 (ref 5–15)
BUN: 15 mg/dL (ref 8–23)
CO2: 26 mmol/L (ref 22–32)
Calcium: 8.6 mg/dL — ABNORMAL LOW (ref 8.9–10.3)
Chloride: 101 mmol/L (ref 98–111)
Creatinine, Ser: 1.03 mg/dL — ABNORMAL HIGH (ref 0.44–1.00)
GFR calc Af Amer: >60 mL/min (ref >60)
GFR calc non Af Amer: 55 mL/min — ABNORMAL LOW (ref >60)
Glucose, Bld: 302 mg/dL — ABNORMAL HIGH (ref 70–99)
Potassium: 4.9 mmol/L (ref 3.5–5.1)
Sodium: 138 mmol/L (ref 135–145)

## 2018-09-01 LAB — HIV ANTIBODY (ROUTINE TESTING W REFLEX): HIV Screen 4th Generation wRfx: NONREACTIVE

## 2018-09-01 MED ORDER — INSULIN GLARGINE 100 UNIT/ML ~~LOC~~ SOLN
50.0000 [IU] | Freq: Every day | SUBCUTANEOUS | Status: DC
  Administered 2018-09-01: 50 [IU] via SUBCUTANEOUS
  Filled 2018-09-01: qty 0.5

## 2018-09-01 MED ORDER — INSULIN ASPART 100 UNIT/ML ~~LOC~~ SOLN
0.0000 [IU] | Freq: Three times a day (TID) | SUBCUTANEOUS | Status: DC
  Administered 2018-09-01: 7 [IU] via SUBCUTANEOUS
  Administered 2018-09-01: 4 [IU] via SUBCUTANEOUS

## 2018-09-01 MED ORDER — IPRATROPIUM-ALBUTEROL 0.5-2.5 (3) MG/3ML IN SOLN
3.0000 mL | Freq: Four times a day (QID) | RESPIRATORY_TRACT | Status: DC
  Administered 2018-09-01 – 2018-09-02 (×3): 3 mL via RESPIRATORY_TRACT
  Filled 2018-09-01 (×4): qty 3

## 2018-09-01 MED ORDER — METHYLPREDNISOLONE SODIUM SUCC 40 MG IJ SOLR
40.0000 mg | Freq: Every day | INTRAMUSCULAR | Status: DC
  Administered 2018-09-02: 40 mg via INTRAVENOUS
  Filled 2018-09-01 (×2): qty 1

## 2018-09-01 MED ORDER — IPRATROPIUM-ALBUTEROL 0.5-2.5 (3) MG/3ML IN SOLN: 3.0000 mL | RESPIRATORY_TRACT | Status: DC | PRN

## 2018-09-01 MED ORDER — OXYCODONE HCL 5 MG PO TABS
5.0000 mg | ORAL_TABLET | Freq: Four times a day (QID) | ORAL | Status: DC | PRN
  Administered 2018-09-01: 5 mg via ORAL
  Filled 2018-09-01: qty 1

## 2018-09-01 MED ORDER — ACETAMINOPHEN 500 MG PO TABS
1000.0000 mg | ORAL_TABLET | Freq: Three times a day (TID) | ORAL | Status: DC
  Administered 2018-09-01 – 2018-09-02 (×3): 1000 mg via ORAL
  Filled 2018-09-01 (×3): qty 2

## 2018-09-01 MED ORDER — INSULIN ASPART 100 UNIT/ML ~~LOC~~ SOLN: 0.0000 [IU] | Freq: Every day | SUBCUTANEOUS | Status: DC

## 2018-09-01 MED ORDER — OSELTAMIVIR PHOSPHATE 75 MG PO CAPS
75.0000 mg | ORAL_CAPSULE | Freq: Two times a day (BID) | ORAL | Status: DC
  Administered 2018-09-01 – 2018-09-02 (×3): 75 mg via ORAL
  Filled 2018-09-01 (×3): qty 1

## 2018-09-01 NOTE — Discharge Instructions (Signed)

## 2018-09-01 NOTE — Plan of Care (Signed)

## 2018-09-01 NOTE — Progress Notes (Signed)
PROGRESS NOTE    [name]  PXT:[id number] DOB: 13-Jun-1951 DOA: 08/31/2018 PCP: Gareth Morgan, MD      Brief Narrative:  [name] is a 68 y.o. F with COPD On home O2, chronic respiratory failure with hypoxia, pAF on AC, seizures, SCHF most recent EF 65%, Dm and hypothyroidism who presents with fever, cough, malaise.  Flu testing postive.      Assessment & Plan:  Influenza pneumonia Community acquired pneumonia, left lower lobe -Start Tamiflu -Continue ceftriaxone and azithromycin -Follow blood cultures  COPD exacerbation Chronic respiratory failure with hypoxia and hypercarbia -Continue Solu-Medrol, reduce to 40 mg once daily -Scheduled duo nebs - Albuterol as needed -Continue pantoprazole -Start flutter valve and incentive spirometry  Acute metabolic encephalopathy The patient is disoriented today, suspect that this is some delirium superimposed on baseline dementia. -Treat underlying infection Delirium precautions:   -Lights and TV off, minimize interruptions at night  -Blinds open and lights on during day  -Glasses/hearing aid with patient  -Frequent reorientation  -PT/OT when able  -Avoid sedation medications/Beers list medications  Paroxysmal atrial fibrillation -Continue amiodarone, apixaban  Chronic combined systolic and diastolic CHF Appears dehydrated -Hold torsemide  Depression -Continue duloxetine  Diabetes -Hold home glipizide -Continue sliding scale correction insulin  Seizures No seizures -Continue Keppra  Hypothyroidism -Continue levothyroxine        MDM and disposition: The below labs and imaging reports were reviewed and summarized above.  Medication management as above.  The patient was admitted with community-acquired pneumonia from influenza, suspect a small lobar bacterial pneumonia.  She remains dyspneic with exertion, requiring more than baseline oxygen, frequent nebs.    She also has some  encephalopathy/delirium/change in mental status.       DVT prophylaxis: SCD Code Status: Full code Family Communication: Daughter at the bedside    Consultants:   None  Procedures:   None  Antimicrobials:   Tamiflu 2/29>>  Ceftriaxone 2/28>>  Azithromycin 2/28>>   Subjective: Still feeling achy, malaise, cough, dyspneic.  Objective: Vitals:   08/31/18 2047 09/01/18 0600 09/01/18 0802 09/01/18 1251  BP:  (!) 156/65  (!) 158/89  Pulse:  94  92  Resp:  20  18  Temp:  97.6 F (36.4 C)  98.2 F (36.8 C)  TempSrc:  Axillary  Oral  SpO2: 94% 95% 96% 97%  Weight:      Height:        Intake/Output Summary (Last 24 hours) at 09/01/2018 1617 Last data filed at 09/01/2018 1500 Gross per 24 hour  Intake 560 ml  Output -  Net 560 ml   Filed Weights   08/31/18 1250 08/31/18 1830  Weight: 106.6 kg 106.6 kg    Examination: General appearance: Overweight adult female, alert and in moderate respiratory distress.   HEENT: Anicteric, conjunctiva pink, lids and lashes normal. No nasal deformity, discharge, epistaxis.  Lips moist, dentition normal, oropharynx dry, no oral lesions, hearing normal.   Skin: Warm and dry.  No jaundice.  No suspicious rashes or lesions. Cardiac: Tachycardic, regular, nl S1-S2, no murmurs appreciated.  Capillary refill is brisk.  JVP not visible.  No LE edema.  Radia  pulses 2+ and symmetric. Respiratory: Tachypneic with exertion, wheezing bilaterally, lung sounds diminished.    Abdomen: Abdomen soft.  no TTP. No ascites, distension, hepatosplenomegaly.   MSK: No deformities or effusions. Neuro: Awake and alert.  EOMI, moves all extremities. Speech fluent.    Psych: Sensorium intact and responding to questions, attention diminished.  Affect blunted.  Judgment and insight appear impaired.    Data Reviewed: I have personally reviewed following labs and imaging studies:  CBC: Recent Labs  Lab 08/31/18 1307 09/01/18 0315  WBC 4.8 4.8    HGB 12.5 12.4  HCT 41.5 39.5  MCV 105.9* 102.9*  PLT 154 157   Basic Metabolic Panel: Recent Labs  Lab 08/31/18 1307 09/01/18 0315  NA 138 138  K 5.1 4.9  CL 105 101  CO2 26 26  GLUCOSE 165* 302*  BUN 15 15  CREATININE 0.77 1.03*  CALCIUM 8.9 8.6*   GFR: Estimated Creatinine Clearance: 63.6 mL/min (A) (by C-G formula based on SCr of 1.03 mg/dL (H)). Liver Function Tests: No results for input(s): AST, ALT, ALKPHOS, BILITOT, PROT, ALBUMIN in the last 168 hours. No results for input(s): LIPASE, AMYLASE in the last 168 hours. No results for input(s): AMMONIA in the last 168 hours. Coagulation Profile: No results for input(s): INR, PROTIME in the last 168 hours. Cardiac Enzymes: No results for input(s): CKTOTAL, CKMB, CKMBINDEX, TROPONINI in the last 168 hours. BNP (last 3 results) No results for input(s): PROBNP in the last 8760 hours. HbA1C: No results for input(s): HGBA1C in the last 72 hours. CBG: Recent Labs  Lab 08/31/18 1847 08/31/18 2034 09/01/18 0813 09/01/18 1038  GLUCAP 120* 178* 257* 249*   Lipid Profile: No results for input(s): CHOL, HDL, LDLCALC, TRIG, CHOLHDL, LDLDIRECT in the last 72 hours. Thyroid Function Tests: No results for input(s): TSH, T4TOTAL, FREET4, T3FREE, THYROIDAB in the last 72 hours. Anemia Panel: No results for input(s): VITAMINB12, FOLATE, FERRITIN, TIBC, IRON, RETICCTPCT in the last 72 hours. Urine analysis:    Component Value Date/Time   COLORURINE YELLOW 09/15/2017 0348   APPEARANCEUR HAZY (A) 09/15/2017 0348   LABSPEC 1.016 09/15/2017 0348   PHURINE 6.0 09/15/2017 0348   GLUCOSEU NEGATIVE 09/15/2017 0348   HGBUR NEGATIVE 09/15/2017 0348   BILIRUBINUR NEGATIVE 09/15/2017 0348   KETONESUR NEGATIVE 09/15/2017 0348   PROTEINUR NEGATIVE 09/15/2017 0348   UROBILINOGEN 0.2 02/05/2015 1700   NITRITE NEGATIVE 09/15/2017 0348   LEUKOCYTESUR NEGATIVE 09/15/2017 0348   Sepsis  Labs: @LABRCNTIP (procalcitonin:4,lacticacidven:4)  )No results found for this or any previous visit (from the past 240 hour(s)).       Radiology Studies: Dg Chest 2 View  Result Date: 08/31/2018 CLINICAL DATA:  68 y/o F; shortness of breath, dizziness, personality changes. EXAM: CHEST - 2 VIEW COMPARISON:  08/25/2018 chest radiograph FINDINGS: Increasing consolidation in the left lung base. Stable background of interstitial opacities probably representing vascular congestion. Stable cardiomegaly given projection and technique. Aortic calcific atherosclerosis. Stable thoracolumbar mild compression deformities post augmentation. No acute osseous abnormality is evident. IMPRESSION: Increasing consolidation in the left lung base, suspected pneumonia. Stable background of interstitial opacities, probably vascular congestion. Electronically Signed   By: Mitzi Hansen M.D.   On: 08/31/2018 14:18        Scheduled Meds: . acetaminophen  1,000 mg Oral TID  . amiodarone  200 mg Oral Daily  . apixaban  5 mg Oral BID  . DULoxetine  30 mg Oral BID  . insulin aspart  0-20 Units Subcutaneous TID WC  . insulin aspart  0-5 Units Subcutaneous QHS  . insulin glargine  50 Units Subcutaneous QHS  . ipratropium-albuterol  3 mL Nebulization Q6H  . levETIRAcetam  1,500 mg Oral BID  . levothyroxine  137 mcg Oral Q0600  . meclizine  25 mg Oral Daily  . [START ON 09/02/2018] methylPREDNISolone (SOLU-MEDROL)  injection  40 mg Intravenous Daily  . oseltamivir  75 mg Oral BID  . pantoprazole  40 mg Oral BID  . sodium chloride flush  3 mL Intravenous Q12H   Continuous Infusions: . sodium chloride    . azithromycin 500 mg (08/31/18 2043)  . cefTRIAXone (ROCEPHIN)  IV 1 g (09/01/18 0036)     LOS: 1 day    Time spent: 35 minutes    Alberteen Sam, MD [facility] 09/01/2018, 4:17 PM     Please page through AMION:  [email or web address] If 7PM-7AM, please contact  night-coverage

## 2018-09-02 DIAGNOSIS — E119 Type 2 diabetes mellitus without complications: Secondary | ICD-10-CM

## 2018-09-02 DIAGNOSIS — Z794 Long term (current) use of insulin: Secondary | ICD-10-CM

## 2018-09-02 DIAGNOSIS — I5032 Chronic diastolic (congestive) heart failure: Secondary | ICD-10-CM

## 2018-09-02 LAB — BASIC METABOLIC PANEL
Anion gap: 9 (ref 5–15)
BUN: 8 mg/dL (ref 8–23)
CO2: 28 mmol/L (ref 22–32)
Calcium: 8.9 mg/dL (ref 8.9–10.3)
Chloride: 101 mmol/L (ref 98–111)
Creatinine, Ser: 0.83 mg/dL (ref 0.44–1.00)
GFR calc Af Amer: >60 mL/min (ref >60)
GFR calc non Af Amer: >60 mL/min (ref >60)
Glucose, Bld: 93 mg/dL (ref 70–99)
Potassium: 3.6 mmol/L (ref 3.5–5.1)
Sodium: 138 mmol/L (ref 135–145)

## 2018-09-02 LAB — CBC
HCT: 40.2 % (ref 36.0–46.0)
Hemoglobin: 12.8 g/dL (ref 12.0–15.0)
MCH: 32 pg (ref 26.0–34.0)
MCHC: 31.8 g/dL (ref 30.0–36.0)
MCV: 100.5 fL — ABNORMAL HIGH (ref 80.0–100.0)
Platelets: 185 10*3/uL (ref 150–400)
RBC: 4 MIL/uL (ref 3.87–5.11)
RDW: 14.1 % (ref 11.5–15.5)
WBC: 10.7 10*3/uL — ABNORMAL HIGH (ref 4.0–10.5)
nRBC: 0 % (ref 0.0–0.2)

## 2018-09-02 LAB — GLUCOSE, CAPILLARY
Glucose-Capillary: 78 mg/dL (ref 70–99)
Glucose-Capillary: 130 mg/dL — ABNORMAL HIGH (ref 70–99)

## 2018-09-02 MED ORDER — OSELTAMIVIR PHOSPHATE 75 MG PO CAPS: 75.0000 mg | ORAL_CAPSULE | Freq: Two times a day (BID) | ORAL | 0 refills | Status: AC

## 2018-09-02 MED ORDER — ALBUTEROL SULFATE (2.5 MG/3ML) 0.083% IN NEBU: 2.5000 mg | INHALATION_SOLUTION | Freq: Four times a day (QID) | RESPIRATORY_TRACT | 1 refills | Status: DC | PRN

## 2018-09-02 MED ORDER — AZITHROMYCIN 250 MG PO TABS: 250.0000 mg | ORAL_TABLET | Freq: Every day | ORAL | 0 refills | Status: DC

## 2018-09-02 MED ORDER — PREDNISONE 10 MG PO TABS: ORAL_TABLET | ORAL | 0 refills | Status: DC

## 2018-09-02 MED ORDER — AMOXICILLIN-POT CLAVULANATE 875-125 MG PO TABS: 1.0000 | ORAL_TABLET | Freq: Two times a day (BID) | ORAL | 0 refills | Status: AC

## 2018-09-02 NOTE — Discharge Summary (Signed)
Physician Discharge Summary  [name] HYQ:[id number] DOB: September 12, 1950 DOA: 08/31/2018  PCP: Tara Morgan, MD  Admit date: 08/31/2018 Discharge date: 09/02/2018  Admitted From: Home  Disposition:  Home   Recommendations for Outpatient Follow-up:  1. Follow up with PCP in 1 week 2. Attempt slow taper of lorazepam   Home Health: No  Equipment/Devices: Nebulizer  Discharge Condition: Fair  CODE STATUS: FULL Diet recommendation: Diabetic  Brief/Interim Summary: [name] is a 68 y.o. F with COPD On home O2, chronic respiratory failure with hypoxia, pAF on AC, seizures, SCHF most recent EF 65%, Dm and hypothyroidism who presents with fever, cough, malaise.  Flu testing postive.     PRINCIPAL HOSPITAL DIAGNOSIS: Influenza A    Discharge Diagnoses:  Influenza pneumonia Community acquired pneumonia, left lower lobe Patient supplemental oxygen was increased, she was started on ceftriaxone, azithromycin, and Tamiflu.  Her heart rate normalized, she remained afebrile for 24 hours, her white blood cell count normalized, her respiratory status improved back to her baseline, and her mentation and oral intake were good.  Family preferred to take her home, and she was discharged to complete a course of Tamiflu, Augmentin, and azithromycin.   COPD exacerbation Chronic respiratory failure with hypoxia and hypercarbia She was started on Solu-Medrol and scheduled bronchodilators.  She was discharged to complete a 5-day prednisone taper.     Acute metabolic encephalopathy Patient had some intermittent disorientation, likely mild delirium in the setting of underlying mild cognitive impairment.  Delirium precautions were recommended.     Paroxysmal atrial fibrillation Amiodarone and apixaban were continued.  Chronic combined systolic and diastolic CHF Torsemide was held.  Depression  Diabetes Sugars were corrected with subcutaneous insulin.  Seizures Prose continued.  No  seizures  Hypothyroidism Home levothyroxine was continued          Discharge Instructions  Discharge Instructions    DME Nebulizer machine   Complete by:  As directed    Patient needs a nebulizer to treat with the following condition:  COPD exacerbation (HCC)   Diet Carb Modified   Complete by:  As directed    Discharge instructions   Complete by:  As directed    From [name]: You were admitted for flu and pneumonia. These have cause a flare of your COPD as well.  For the flu: Use good hand hygiene.  Minimize contact with family. Take oseltamivir/Tamiflu 75 mg twice daily until gone  For the pneumonia: Use the flutter valve several times per day Take azithromycin 250 mg once daily for the next 4 days starting Monday Take Augmentin 875-125 mg twice daily for the next 4 days, starting tonight  For the COPD flare: Use the nebulizer and albuterol four times a day for the next few days Slowly taper using the albuterol (breathing treatment) after that, until you only use it as needed Take prednisone according to this taper: Take prednisone 50 mg (5 tabs Monday) for 1 day, then  Take prednisone 40 mg (4 tabs Tues) for 1 day, then  Take prednisone 30 mg (3 tabs) for 1 day, then  Take prednisone 20 mg (2 tabs) for 1 day, then  Take prednisone 10 mg for one day then stop.    Call your primary care doctor to follow up in 1-2 weeks  For the next few days, don't take your Torsemide or potassium When you are eating and drinking like normal again (in 2-3 days) restart the torsemide   Go back to taking Lantus  for the next week, while you are on steroids   Increase activity slowly   Complete by:  As directed      Allergies as of 09/02/2018      Reactions   Celexa [citalopram Hydrobromide] Other (See Comments)   Dyskinesia   Codeine Nausea And Vomiting, Other (See Comments)   HALLUCINATIONS   Dilaudid [hydromorphone Hcl] Other (See Comments)   Made her pass out    Reglan [metoclopramide] Other (See Comments)   DYSKINESIA      Medication List    TAKE these medications   acetaminophen 500 MG tablet Commonly known as:  TYLENOL Take 500 mg by mouth every 6 (six) hours as needed for moderate pain or headache.   albuterol (2.5 MG/3ML) 0.083% nebulizer solution Commonly known as:  PROVENTIL Take 3 mLs (2.5 mg total) by nebulization every 6 (six) hours as needed for wheezing or shortness of breath.   amiodarone 200 MG tablet Commonly known as:  PACERONE TAKE ONE TABLET BY MOUTH EVERY MORNING What changed:  when to take this   amoxicillin-clavulanate 875-125 MG tablet Commonly known as:  AUGMENTIN Take 1 tablet by mouth 2 (two) times daily for 4 days.   apixaban 5 MG Tabs tablet Commonly known as:  ELIQUIS Take 1 tablet (5 mg total) by mouth 2 (two) times daily.   azithromycin 250 MG tablet Commonly known as:  ZITHROMAX Z-PAK Take 1 tablet (250 mg total) by mouth daily.   dicyclomine 10 MG capsule Commonly known as:  BENTYL Take 2 capsules (20 mg total) by mouth 2 (two) times daily.   DULoxetine 30 MG capsule Commonly known as:  CYMBALTA Take 30 mg by mouth 2 (two) times daily.   gabapentin 400 MG capsule Commonly known as:  NEURONTIN Take 400-800 mg by mouth See admin instructions. Take 1 capsule(400mg ) by mouth twice a day and take 2 capsules(800mg ) by mouth at bedtime   glipiZIDE 10 MG tablet Commonly known as:  GLUCOTROL Take 10 mg by mouth 2 (two) times daily.   HYDROcodone-acetaminophen 10-325 MG tablet Commonly known as:  NORCO Take 1 tablet by mouth 4 (four) times daily.   insulin aspart 100 UNIT/ML injection Commonly known as:  novoLOG Inject 0-20 Units into the skin every 4 (four) hours. CBG 0-120--no units; 121-150--3 units; 151-200--4 units; 201-250--7units; 251-300--11 units; 301-350--15 units; 351-400--20 units   insulin glargine 100 UNIT/ML injection Commonly known as:  LANTUS Inject 0.55 mLs (55 Units total)  into the skin at bedtime.   levETIRAcetam 1000 MG tablet Commonly known as:  KEPPRA Take 1,500 mg by mouth 2 (two) times daily.   levothyroxine 137 MCG tablet Commonly known as:  SYNTHROID, LEVOTHROID Take 137 mcg by mouth daily.   loperamide 2 MG capsule Commonly known as:  IMODIUM TAKE ONE CAPSULE BY MOUTH TWICE DAILY AS NEEDED What changed:  reasons to take this   LORazepam 1 MG tablet Commonly known as:  ATIVAN Take 1 mg by mouth 3 (three) times daily as needed. for anxiety   magnesium oxide 400 MG tablet Commonly known as:  MAG-OX Take 1 tablet (400 mg total) by mouth 2 (two) times daily.   meclizine 25 MG tablet Commonly known as:  ANTIVERT Take 25 mg by mouth daily.   metFORMIN 500 MG tablet Commonly known as:  GLUCOPHAGE Take 500 mg by mouth 2 (two) times daily with a meal.   multivitamin with minerals Tabs tablet Take 1 tablet by mouth daily.   naproxen sodium 220 MG  tablet Commonly known as:  ALEVE Take 440 mg by mouth as needed (pain).   nystatin powder Commonly known as:  MYCOSTATIN/NYSTOP Apply topically 2 (two) times daily. To affected area. Keep dry. What changed:    how much to take  when to take this  reasons to take this   ondansetron 4 MG tablet Commonly known as:  ZOFRAN TAKE 1 TABLET BY MOUTH FOUR TIMES DAILY AS NEEDED FOR NAUSEA What changed:  See the new instructions.   oseltamivir 75 MG capsule Commonly known as:  TAMIFLU Take 1 capsule (75 mg total) by mouth 2 (two) times daily for 7 doses.   OXYGEN 2lpm with sleep and as needed during the day   pantoprazole 40 MG tablet Commonly known as:  PROTONIX TAKE ONE TABLET BY MOUTH TWICE DAILY BEFORE APPLY MEAL What changed:  See the new instructions.   potassium chloride 10 MEQ tablet Commonly known as:  K-DUR,KLOR-CON TAKE TWO TABLETS BY MOUTH TWICE DAILY   predniSONE 10 MG tablet Commonly known as:  DELTASONE Take 50 mg (5 tabs) for 1 day, then 40 mg (4 tabs) for 1 day, then  30 mg for 1 day, then 20 mg for 1 day, then 10 mg then stop. What changed:    how much to take  how to take this  when to take this  additional instructions   torsemide 20 MG tablet Commonly known as:  DEMADEX TAKE TWO TABLETS BY MOUTH IN THE MORNING AND TAKE 1 TABLET IN THE EVENING EVERY DAY (MAY TAKE ADDITIONAL TABLET AS NEEDED FOR SWELLING What changed:  See the new instructions.   Vitamin D3 50 MCG (2000 UT) capsule Take 2,000 Units by mouth daily.            Durable Medical Equipment  (From admission, onward)         Start     Ordered   09/02/18 0000  DME Nebulizer machine    Question:  Patient needs a nebulizer to treat with the following condition  Answer:  COPD exacerbation (HCC)   09/02/18 1134         Follow-up Information    [facility] - Dme Follow up.   Why:  Nebulizer machine arranged- to be delivered to room prior to discharge- (will also bring portable 02 tank) Contact information: [address] [place] [address] [phone]          Allergies  Allergen Reactions  . Celexa [Citalopram Hydrobromide] Other (See Comments)    Dyskinesia  . Codeine Nausea And Vomiting and Other (See Comments)    HALLUCINATIONS  . Dilaudid [Hydromorphone Hcl] Other (See Comments)    Made her pass out  . Reglan [Metoclopramide] Other (See Comments)    DYSKINESIA    Consultations:  None   Procedures/Studies: Dg Chest 2 View  Result Date: 08/31/2018 CLINICAL DATA:  68 y/o F; shortness of breath, dizziness, personality changes. EXAM: CHEST - 2 VIEW COMPARISON:  08/25/2018 chest radiograph FINDINGS: Increasing consolidation in the left lung base. Stable background of interstitial opacities probably representing vascular congestion. Stable cardiomegaly given projection and technique. Aortic calcific atherosclerosis. Stable thoracolumbar mild compression deformities post augmentation. No acute osseous abnormality is evident. IMPRESSION:  Increasing consolidation in the left lung base, suspected pneumonia. Stable background of interstitial opacities, probably vascular congestion. Electronically Signed   By: Mitzi Hansen M.D.   On: 08/31/2018 14:18   Dg Chest 2 View  Result Date: 08/25/2018 CLINICAL DATA:  Cough  EXAM: CHEST - 2 VIEW COMPARISON:  01/27/2018 FINDINGS: Cardiomegaly. Patchy airspace disease in the left lung base stable compatible with scarring. Mild vascular congestion. No overt edema or effusions. No acute bony abnormality. IMPRESSION: Cardiomegaly with vascular congestion. Stable left basilar scarring. Electronically Signed   By: Charlett Nose M.D.   On: 08/25/2018 20:08       Subjective: Patient was uncomfortable, has back pain, but no dyspnea, respiratory distress, chest pain, swelling.  She was somewhat disoriented overnight with lorazepam.  She feels achy, still has cough.  Discharge Exam: Vitals:   09/02/18 0042 09/02/18 0919  BP:    Pulse:  73  Resp:  20  Temp:    SpO2: 91% 97%   Vitals:   09/01/18 2027 09/01/18 2100 09/02/18 0042 09/02/18 0919  BP:  (!) 152/72    Pulse:    73  Resp:  18  20  Temp:  99 F (37.2 C)    TempSrc:  Oral    SpO2: 97%  91% 97%  Weight:      Height:        General: Patient restless, lying in bed, oriented but uncomfortable, tired. Cardiovascular: RRR, nl S1-S2, no murmurs appreciated.   No LE edema.   Respiratory: Normal respiratory rate and rhythm.  Easing bilaterally. Abdominal: Abdomen soft and non-tender.  No distension or HSM.   Neuro/Psych: Strength symmetric in upper and lower extremities.  Judgment and insight appear somewhat impaired.  Attention diminished.   The results of significant diagnostics from this hospitalization (including imaging, microbiology, ancillary and laboratory) are listed below for reference.     Microbiology: No results found for this or any previous visit (from the past 240 hour(s)).   Labs: BNP (last 3  results) Recent Labs    02/16/18 1043  BNP 191.0*   Basic Metabolic Panel: Recent Labs  Lab 08/31/18 1307 09/01/18 0315 09/02/18 0610  NA 138 138 138  K 5.1 4.9 3.6  CL 105 101 101  CO2 GLUCOSE 165* 302* 93  BUN CREATININE 0.77 1.03* 0.83  CALCIUM 8.9 8.6* 8.9   Liver Function Tests: No results for input(s): AST, ALT, ALKPHOS, BILITOT, PROT, ALBUMIN in the last 168 hours. No results for input(s): LIPASE, AMYLASE in the last 168 hours. No results for input(s): AMMONIA in the last 168 hours. CBC: Recent Labs  Lab 08/31/18 1307 09/01/18 0315 09/02/18 0610  WBC 4.8 4.8 10.7*  HGB 12.5 12.4 12.8  HCT 41.5 39.5 40.2  MCV 105.9* 102.9* 100.5*  PLT 154 157 185   Cardiac Enzymes: No results for input(s): CKTOTAL, CKMB, CKMBINDEX, TROPONINI in the last 168 hours. BNP: Invalid input(s): POCBNP CBG: Recent Labs  Lab 09/01/18 1038 09/01/18 1710 09/01/18 2133 09/02/18 0844 09/02/18 1224  GLUCAP 249* 176* 108* 78 130*   D-Dimer No results for input(s): DDIMER in the last 72 hours. Hgb A1c No results for input(s): HGBA1C in the last 72 hours. Lipid Profile No results for input(s): CHOL, HDL, LDLCALC, TRIG, CHOLHDL, LDLDIRECT in the last 72 hours. Thyroid function studies No results for input(s): TSH, T4TOTAL, T3FREE, THYROIDAB in the last 72 hours.  Invalid input(s): FREET3 Anemia work up No results for input(s): VITAMINB12, FOLATE, FERRITIN, TIBC, IRON, RETICCTPCT in the last 72 hours. Urinalysis    Component Value Date/Time   COLORURINE YELLOW 09/15/2017 0348   APPEARANCEUR HAZY (A) 09/15/2017 0348   LABSPEC 1.016 09/15/2017 0348   PHURINE 6.0 09/15/2017 0348  GLUCOSEU NEGATIVE 09/15/2017 0348   HGBUR NEGATIVE 09/15/2017 0348   BILIRUBINUR NEGATIVE 09/15/2017 0348   KETONESUR NEGATIVE 09/15/2017 0348   PROTEINUR NEGATIVE 09/15/2017 0348   UROBILINOGEN 0.2 02/05/2015 1700   NITRITE NEGATIVE 09/15/2017 0348   LEUKOCYTESUR NEGATIVE  09/15/2017 0348   Sepsis Labs Invalid input(s): PROCALCITONIN,  WBC,  LACTICIDVEN Microbiology No results found for this or any previous visit (from the past 240 hour(s)).   Time coordinating discharge: 40 minutes      SIGNED:   Alberteen Sam, MD  [facility] 09/02/2018, 3:39 PM

## 2018-09-02 NOTE — Care Management Note (Signed)
Case Management Note RN CM weekend coverage for 6N [phone]  Patient Details  Name: [name] MRN: [id number] Date of Birth: [birth year]  Subjective/Objective:    Pt admitted with CAP                Action/Plan: PTA pt lived at home, has home 02 with [facility], order placed for home nebulizer, call made to [facility] with [facility] for DME needs- nebulizer machine to be delivered to bedside prior to discharge. Call received from bedside RN Neysa Bonito that pt also need 02 tank for transport home- [facility] to bring portable 02 tank along with nebulizer for transition home.   Expected Discharge Date:  09/02/18               Expected Discharge Plan:  Home/Self Care  In-House Referral:     Discharge planning Services  CM Consult  Post Acute Care Choice:  Durable Medical Equipment Choice offered to:     DME Arranged:  Oxygen, Nebulizer machine DME Agency:  [facility]  HH Arranged:    [facility] Agency:     Status of Service:  Completed, signed off  If discussed at Long Length of Stay Meetings, dates discussed:    Discharge Disposition: home/self care   Additional Comments:  Darrold Span, RN 09/02/2018, 11:42 AM

## 2018-09-02 NOTE — Progress Notes (Signed)
Pt very restless and anxious, she pulled her IV out and is refusing an IV start

## 2018-09-28 DIAGNOSIS — R2689 Other abnormalities of gait and mobility: Secondary | ICD-10-CM | POA: Diagnosis not present

## 2018-09-28 DIAGNOSIS — M797 Fibromyalgia: Secondary | ICD-10-CM | POA: Diagnosis not present

## 2018-09-28 DIAGNOSIS — M25561 Pain in right knee: Secondary | ICD-10-CM | POA: Diagnosis not present

## 2018-10-04 DIAGNOSIS — I5032 Chronic diastolic (congestive) heart failure: Secondary | ICD-10-CM | POA: Diagnosis not present

## 2018-10-04 DIAGNOSIS — R269 Unspecified abnormalities of gait and mobility: Secondary | ICD-10-CM | POA: Diagnosis not present

## 2018-10-05 DIAGNOSIS — I480 Paroxysmal atrial fibrillation: Secondary | ICD-10-CM | POA: Diagnosis not present

## 2018-10-05 DIAGNOSIS — Z7901 Long term (current) use of anticoagulants: Secondary | ICD-10-CM | POA: Diagnosis not present

## 2018-10-05 DIAGNOSIS — M545 Low back pain: Secondary | ICD-10-CM | POA: Diagnosis not present

## 2018-10-05 DIAGNOSIS — E1142 Type 2 diabetes mellitus with diabetic polyneuropathy: Secondary | ICD-10-CM | POA: Diagnosis not present

## 2018-10-08 ENCOUNTER — Other Ambulatory Visit: Payer: Self-pay | Admitting: Nurse Practitioner

## 2018-10-26 ENCOUNTER — Encounter: Payer: Self-pay | Admitting: Gastroenterology

## 2018-10-26 ENCOUNTER — Ambulatory Visit (INDEPENDENT_AMBULATORY_CARE_PROVIDER_SITE_OTHER): Payer: PPO | Admitting: Gastroenterology

## 2018-10-26 ENCOUNTER — Other Ambulatory Visit: Payer: Self-pay

## 2018-10-26 DIAGNOSIS — D509 Iron deficiency anemia, unspecified: Secondary | ICD-10-CM

## 2018-10-26 DIAGNOSIS — K580 Irritable bowel syndrome with diarrhea: Secondary | ICD-10-CM | POA: Diagnosis not present

## 2018-10-26 DIAGNOSIS — K3184 Gastroparesis: Secondary | ICD-10-CM | POA: Diagnosis not present

## 2018-10-26 DIAGNOSIS — K529 Noninfective gastroenteritis and colitis, unspecified: Secondary | ICD-10-CM

## 2018-10-26 NOTE — Progress Notes (Signed)
CC'ED TO PCP 

## 2018-10-26 NOTE — Patient Instructions (Signed)
I am glad you are doing well!  We will see you in 6 months or sooner if needed!  I enjoyed talking with you again today! As you know, I value our relationship and want to provide genuine, compassionate, and quality care. I welcome your feedback. If you receive a survey regarding your visit,  I greatly appreciate you taking time to fill this out. See you next time!  Gelene Mink, PhD, ANP-BC [facility] Gastroenterology

## 2018-10-26 NOTE — Progress Notes (Signed)
Referring Provider:  Primary Care Physician:  Gareth Morgan, MD  Primary GI: Dr. Darrick Penna   Virtual Visit via Telephone Note Due to COVID-19, visit is conducted virtually and was requested by patient.   I connected with [name] on 10/26/18 at  9:00 AM EDT by telephone and verified that I am speaking with the correct person using two identifiers.   I discussed the limitations, risks, security and privacy concerns of performing an evaluation and management service by telephone and the availability of in person appointments. I also discussed with the patient that there may be a patient responsible charge related to this service. The patient expressed understanding and agreed to proceed.  Chief Complaint  Patient presents with  . Anemia     History of Present Illness: 68 y.o. female presenting today with a history of chronic abdominal pain, gastroparesis, IBS-D, history of IDA. EGD in May 2016 due to anemia and history of Barrett's without evidence of Barrett's. Last colonoscopy in May 2015. BPE hasnoted small to moderate sized sliding type hiatal hernia with spontaneous and inducible reflux, nonspecific esophageal dysmotility.Capsule in April 2015 with idiopathic small bowel ulcers that have been present since 2013.   Gastroparesis: no vomiting. Zofran, 4 times a day. Controls nausea.   IBS-D: at baseline for patient. Twice daily. Bentyl BID.   IDA: unable to tolerate oral iron in the past. Followed by Hematology. No overt GI bleeding.   Appetite is sometimes ravenous and sometimes not at all. Weight 231. Every once in awhile gets choked if too large a pill or eating too fast. GERD controlled with PPI BID.   Past Medical History:  Diagnosis Date  . Abnormal pulmonary function test   . Anemia    H/H of 10/30 with a normal MCV in 12/09  . Anxiety   . Arthritis   . Barrett's esophagus    Diagnosed 1995. Last EGD 2016-NO BARRETT'S.   . Chest pain    Negative cardiac  catheterization in 2002; negative stress nuclear study in 2008  . Chronic anticoagulation   . Chronic combined systolic and diastolic CHF (congestive heart failure) (HCC)    a. EF predominantly normal during prior echoes but was 40% during 10/2014 echo. b. Most recent 01/2015 EF normal, 55-60%.  . Chronic LBP    Surgical intervention in 1996  . Diabetes mellitus, type 2 (HCC)    Insulin therapy; exacerbated by prednisone  . Dysrhythmia    AFib  . Gastroparesis    99% retention 05/2008 on GES  . GERD (gastroesophageal reflux disease)   . Hiatal hernia   . Hyperlipidemia   . Hypertension   . Hypothyroid   . IBS (irritable bowel syndrome)   . Obesity   . OSA on CPAP    had CPAP and cannot tolerate.  . Paroxysmal atrial fibrillation (HCC)   . Pulmonary hypertension (HCC) 01/2015   a. Predominantly pulmonary venous hypertension but may be component of PAH.  . Seizures (HCC)    last seizure was 2 years ago; on keppra for this; unknown etiology  . Syncope    a. Admitted 05/2009; magnetic resonance imagin/ MRA - negative; etiology thought to be orthostasis secondary to drugs and dehydration. b. Syncope 02/2015 also felt 2/2 dehydration.     Past Surgical History:  Procedure Laterality Date  . BACK SURGERY  1996  . BIOPSY N/A 11/08/2013   Procedure: BIOPSY  / Tissue sampling / ulcers present in small intestine;  Surgeon: West Bali,  MD;  Location: [facility];  Service: Endoscopy;  Laterality: N/A;  . CARDIAC CATHETERIZATION  2002  . CARDIAC CATHETERIZATION N/A 01/26/2015   Procedure: Right Heart Cath;  Surgeon: Laurey Morale, MD;  Location: [facility] INVASIVE CV LAB;  Service: Cardiovascular;  Laterality: N/A;  . CARDIOVASCULAR STRESS TEST  2008   Stress nuclear study  . CARDIOVERSION N/A 03/06/2015   Procedure: CARDIOVERSION;  Surgeon: Laurey Morale, MD;  Location: [facility] ENDOSCOPY;  Service: Cardiovascular;  Laterality: N/A;  . CARPAL TUNNEL RELEASE  1994  . COLONOSCOPY  11/2011   Dr.  Darrick Penna: Internal hemorrhoids, mild diverticulosis. Random colon biopsies negative.  . COLONOSCOPY N/A 11/08/2013   SLF: Normal mucosa in the terminal ileum/The colon IS redundant/  Moderate diverticulosis throughout the entire colon. ileum bx benign. colon bx benign  . ESOPHAGOGASTRODUODENOSCOPY  2008   Barrett's without dysplasia. esphagus dilated. antral erosions, h.pylori serologies negative.  . ESOPHAGOGASTRODUODENOSCOPY  11/2011   Dr. Darrick Penna: Barrett's esophagus, mild gastritis, diverticulum in the second portion of the duodenum repeat EGD 3 years. Small bowel biopsies negative. Gastric biopsy show reactive gastropathy but no H. pylori. Esophageal biopsies consistent with GERD. Next EGD 11/2014  . ESOPHAGOGASTRODUODENOSCOPY N/A 11/21/2014   ZOX:WRUE non-erosive gastritis/irregular z-line  . GIVENS CAPSULE STUDY  12/07/2011   Proximal small bowel, rare AVM. Distal small bowel, multiple ulcers noted  . GIVENS CAPSULE STUDY N/A 09/27/2013   Distal small bowel ulcers extending to TI.  Marland Kitchen GIVENS CAPSULE STUDY N/A 10/10/2013   Procedure: GIVENS CAPSULE STUDY;  Surgeon: West Bali, MD;  Location: [facility];  Service: Endoscopy;  Laterality: N/A;  7:30  . IR KYPHO THORACIC WITH BONE BIOPSY  02/09/2018  . KNEE ARTHROSCOPY WITH MEDIAL MENISECTOMY Right 06/09/2016   Procedure: KNEE ARTHROSCOPY WITH MEDIAL MENISECTOMY;  Surgeon: Vickki Hearing, MD;  Location: [facility] ORS;  Service: Orthopedics;  Laterality: Right;  medial and lateral menisectomy  . LAMINECTOMY  1995   L4-L5  . LAPAROSCOPIC CHOLECYSTECTOMY  1990s  . LEFT HEART CATHETERIZATION WITH CORONARY ANGIOGRAM  01/10/2014   Procedure: LEFT HEART CATHETERIZATION WITH CORONARY ANGIOGRAM;  Surgeon: Lesleigh Noe, MD;  Location: [facility] CATH LAB;  Service: Cardiovascular;;  . RIGHT HEART CATHETERIZATION N/A 01/10/2014   Procedure: RIGHT HEART CATH;  Surgeon: Lesleigh Noe, MD;  Location: [facility] CATH LAB;  Service: Cardiovascular;  Laterality: N/A;  . TOTAL  ABDOMINAL HYSTERECTOMY  1999  . TRACHEOSTOMY TUBE PLACEMENT N/A 09/01/2017   Procedure: TRACHEOSTOMY;  Surgeon: Drema Halon, MD;  Location: [facility] OR;  Service: ENT;  Laterality: N/A;     Current Meds  Medication Sig  . acetaminophen (TYLENOL) 500 MG tablet Take 500 mg by mouth every 6 (six) hours as needed for moderate pain or headache.  . albuterol (PROVENTIL) (2.5 MG/3ML) 0.083% nebulizer solution Take 3 mLs (2.5 mg total) by nebulization every 6 (six) hours as needed for wheezing or shortness of breath.  Marland Kitchen amiodarone (PACERONE) 200 MG tablet TAKE ONE TABLET BY MOUTH EVERY MORNING  . apixaban (ELIQUIS) 5 MG TABS tablet Take 1 tablet (5 mg total) by mouth 2 (two) times daily.  . Cholecalciferol (VITAMIN D3) 2000 units capsule Take 2,000 Units by mouth daily.  Marland Kitchen dicyclomine (BENTYL) 10 MG capsule Take 2 capsules (20 mg total) by mouth 2 (two) times daily.  . DULoxetine (CYMBALTA) 30 MG capsule Take 30 mg by mouth 2 (two) times daily.  Marland Kitchen gabapentin (NEURONTIN) 400 MG capsule Take 400-800 mg by mouth  See admin instructions. Take 1 capsule(400mg ) by mouth twice a day and take 2 capsules(800mg ) by mouth at bedtime  . glipiZIDE (GLUCOTROL) 10 MG tablet Take 10 mg by mouth 2 (two) times daily.  Marland Kitchen HYDROcodone-acetaminophen (NORCO) 10-325 MG tablet Take 1 tablet by mouth 4 (four) times daily.  . insulin aspart (NOVOLOG) 100 UNIT/ML injection Inject 0-20 Units into the skin every 4 (four) hours. CBG 0-120--no units; 121-150--3 units; 151-200--4 units; 201-250--7units; 251-300--11 units; 301-350--15 units; 351-400--20 units  . insulin glargine (LANTUS) 100 UNIT/ML injection Inject 0.55 mLs (55 Units total) into the skin at bedtime.  . levETIRAcetam (KEPPRA) 1000 MG tablet Take 1,500 mg by mouth 2 (two) times daily.   Marland Kitchen levothyroxine (SYNTHROID, LEVOTHROID) 137 MCG tablet Take 137 mcg by mouth daily.  Marland Kitchen loperamide (IMODIUM) 2 MG capsule TAKE ONE CAPSULE BY MOUTH TWICE DAILY AS NEEDED  . LORazepam  (ATIVAN) 1 MG tablet Take 1 mg by mouth 3 (three) times daily as needed. for anxiety  . magnesium oxide (MAG-OX) 400 MG tablet Take 1 tablet (400 mg total) by mouth 2 (two) times daily.  . meclizine (ANTIVERT) 25 MG tablet Take 25 mg by mouth as needed.   . metFORMIN (GLUCOPHAGE) 500 MG tablet Take 500 mg by mouth 2 (two) times daily with a meal.  . Multiple Vitamin (MULTIVITAMIN WITH MINERALS) TABS tablet Take 1 tablet by mouth daily.  . naproxen sodium (ALEVE) 220 MG tablet Take 440 mg by mouth as needed (pain).   . ondansetron (ZOFRAN) 4 MG tablet TAKE 1 TABLET BY MOUTH FOUR TIMES DAILY AS NEEDED FOR NAUSEA (Patient taking differently: Take 4 mg by mouth 4 (four) times daily as needed for nausea. )  . OXYGEN 2lpm with sleep and as needed during the day  . pantoprazole (PROTONIX) 40 MG tablet TAKE ONE TABLET BY MOUTH TWICE DAILY BEFORE APPLY MEAL (Patient taking differently: Take 40 mg by mouth 2 (two) times daily. )  . potassium chloride (K-DUR,KLOR-CON) 10 MEQ tablet TAKE TWO TABLETS BY MOUTH TWICE DAILY (Patient taking differently: Take 20 mEq by mouth 2 (two) times daily. )  . torsemide (DEMADEX) 20 MG tablet TAKE TWO TABLETS BY MOUTH IN THE MORNING AND TAKE 1 TABLET IN THE EVENING EVERY DAY (MAY TAKE ADDITIONAL TABLET AS NEEDED FOR SWELLING (Patient taking differently: Take 20-40 mg by mouth See admin instructions. TAKE TWO TABLETS BY MOUTH IN THE MORNING AND TAKE 1 TABLET IN THE EVENING EVERY DAY (MAY TAKE ADDITIONAL TABLET AS NEEDED FOR SWELLING)      Review of Systems: Gen: Denies fever, chills, anorexia. Denies fatigue, weakness, weight loss.  CV: Denies chest pain, palpitations, syncope, peripheral edema, and claudication. Resp: Denies dyspnea at rest, cough, wheezing, coughing up blood, and pleurisy. GI: see HPI Derm: Denies rash, itching, dry skin Psych: Denies depression, anxiety, memory loss, confusion. No homicidal or suicidal ideation.  Heme: Denies bruising, bleeding, and  enlarged lymph nodes.  Observations/Objective: No distress. Unable to perform physical exam due to telephone encounter. No video available.   Assessment and Plan: Pleasant 68 year old female doing well and at baseline with chronic GI issues. Chronic diarrhea at baseline, gastroparesis well managed with PPI and zofran. Continues to follow with Hematology for IDA. Will see her again in 6 months or sooner if needed.   Follow Up Instructions:    I discussed the assessment and treatment plan with the patient. The patient was provided an opportunity to ask questions and all were answered. The patient agreed with the  plan and demonstrated an understanding of the instructions.   The patient was advised to call back or seek an in-person evaluation if the symptoms worsen or if the condition fails to improve as anticipated.  I provided 15 minutes of non-face-to-face time during this encounter.  Gelene Mink, PhD, ANP-BC [facility]

## 2018-11-02 ENCOUNTER — Other Ambulatory Visit (HOSPITAL_COMMUNITY): Payer: Medicare Other

## 2018-11-03 DIAGNOSIS — I5032 Chronic diastolic (congestive) heart failure: Secondary | ICD-10-CM | POA: Diagnosis not present

## 2018-11-03 DIAGNOSIS — R269 Unspecified abnormalities of gait and mobility: Secondary | ICD-10-CM | POA: Diagnosis not present

## 2018-11-09 ENCOUNTER — Ambulatory Visit (HOSPITAL_COMMUNITY): Payer: PPO | Admitting: Nurse Practitioner

## 2018-12-04 DIAGNOSIS — I5032 Chronic diastolic (congestive) heart failure: Secondary | ICD-10-CM | POA: Diagnosis not present

## 2018-12-04 DIAGNOSIS — R269 Unspecified abnormalities of gait and mobility: Secondary | ICD-10-CM | POA: Diagnosis not present

## 2018-12-05 ENCOUNTER — Other Ambulatory Visit: Payer: Self-pay | Admitting: Cardiology

## 2018-12-11 DIAGNOSIS — M79672 Pain in left foot: Secondary | ICD-10-CM | POA: Diagnosis not present

## 2018-12-11 DIAGNOSIS — E1142 Type 2 diabetes mellitus with diabetic polyneuropathy: Secondary | ICD-10-CM | POA: Diagnosis not present

## 2018-12-11 DIAGNOSIS — M79671 Pain in right foot: Secondary | ICD-10-CM | POA: Diagnosis not present

## 2019-01-03 DIAGNOSIS — R269 Unspecified abnormalities of gait and mobility: Secondary | ICD-10-CM | POA: Diagnosis not present

## 2019-01-03 DIAGNOSIS — I5032 Chronic diastolic (congestive) heart failure: Secondary | ICD-10-CM | POA: Diagnosis not present

## 2019-01-04 IMAGING — DX DG CHEST 2V
2 series · 2 of 2 positions shown · non-contrast
Comparison: 04/21/2015

CLINICAL DATA: Shortness of breath and productive cough for 2
weeks, 4 pound weight gain in 1 week, history CHF, diabetes
mellitus, hypertension, atrial fibrillation, GERD, irritable bowel
syndrome

EXAM:
CHEST  2 VIEW

[chest pa]
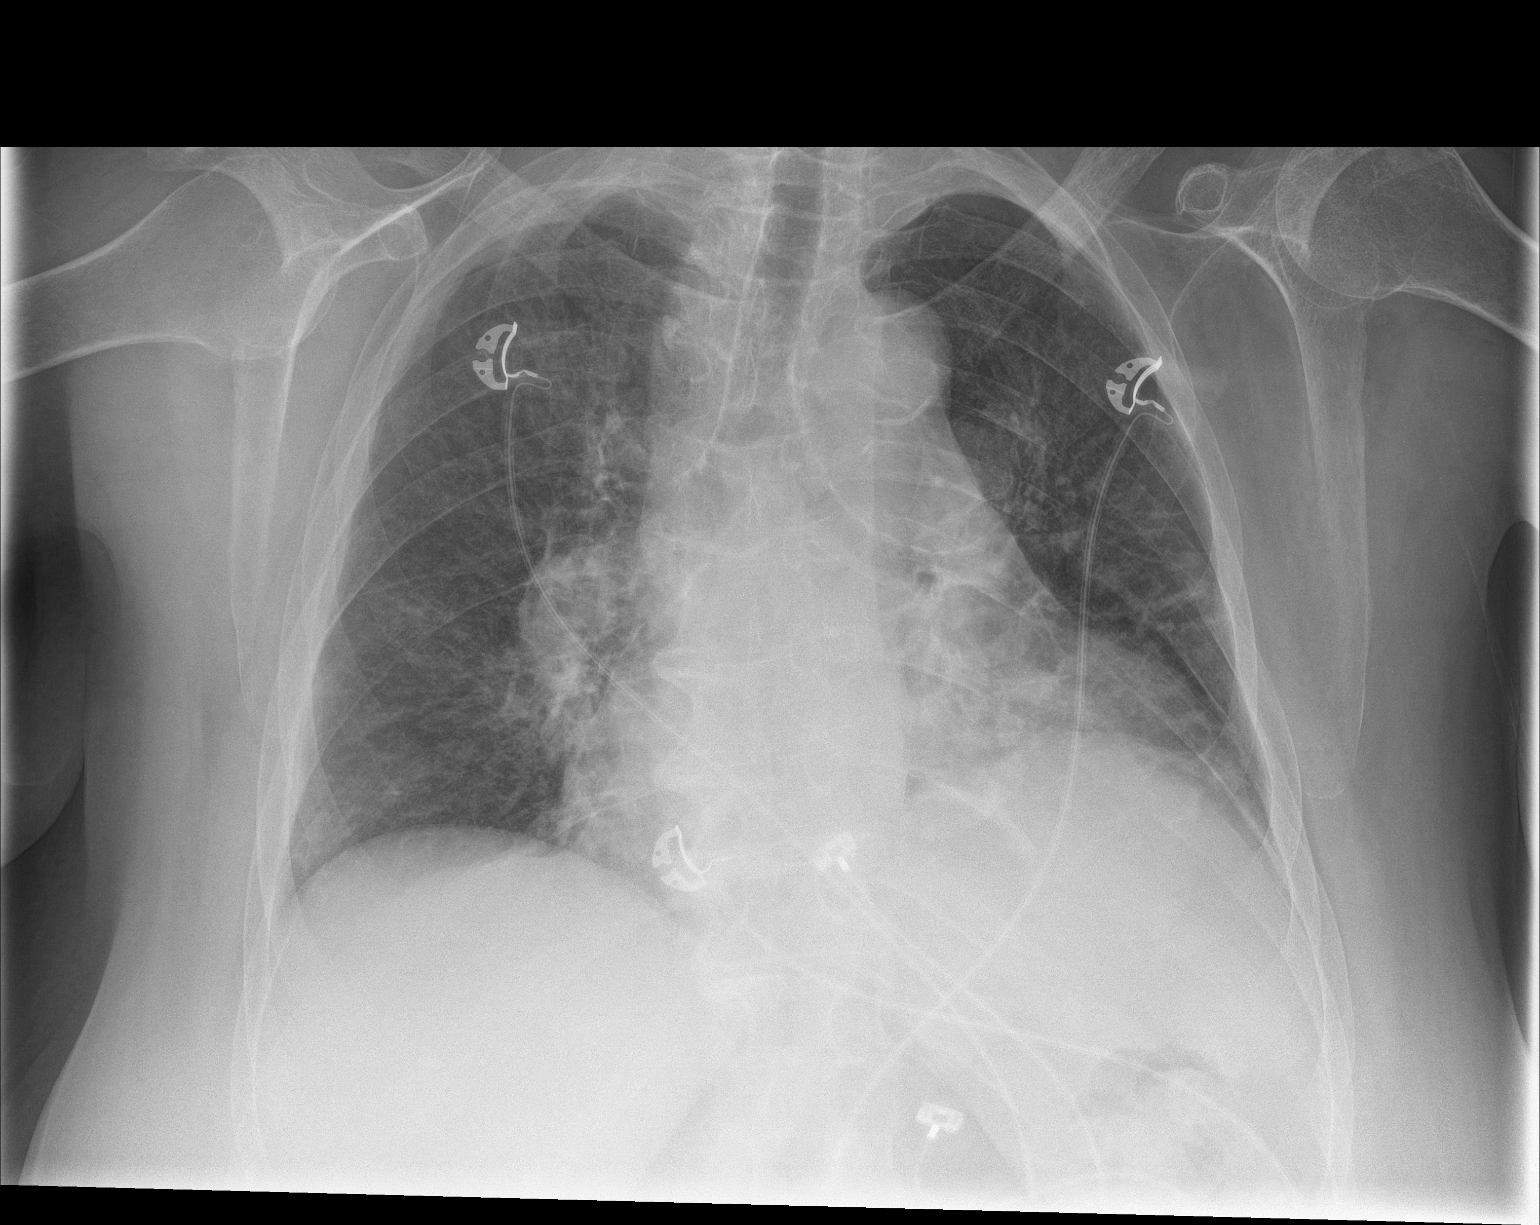

[chest lat]
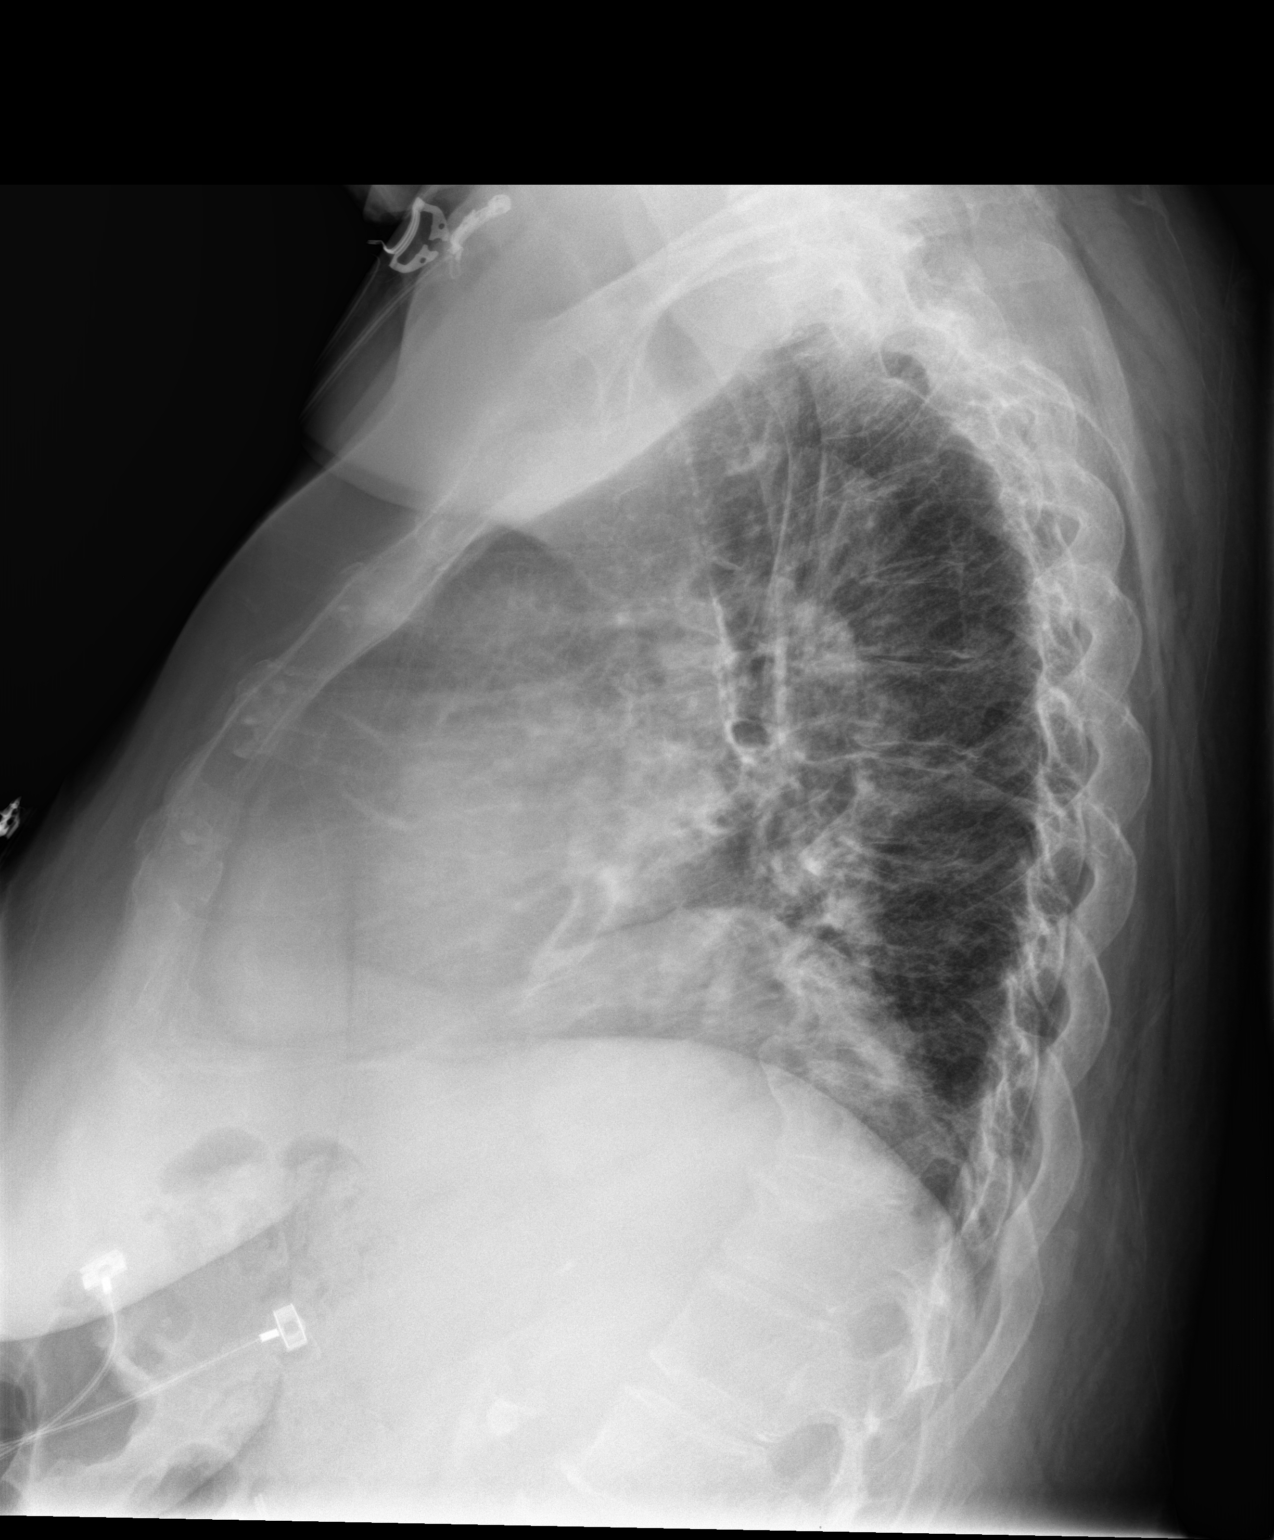

[2 of 2 positions shown; findings below may reference images not displayed]

FINDINGS: Enlargement of cardiac silhouette.

Atherosclerotic calcification aorta.

Slight prominent central pulmonary arteries.

Increased LEFT lower lobe opacity consistent with pneumonia.

Remaining lungs clear.

No pleural effusion or pneumothorax.

Bones demineralized.
IMPRESSION: Enlargement of cardiac silhouette with question pulmonary arterial
hypertension.

Increased LEFT lower lobe opacity consistent with pneumonia.

Aortic atherosclerosis.

## 2019-01-11 DIAGNOSIS — I5032 Chronic diastolic (congestive) heart failure: Secondary | ICD-10-CM | POA: Diagnosis not present

## 2019-01-11 DIAGNOSIS — E039 Hypothyroidism, unspecified: Secondary | ICD-10-CM | POA: Diagnosis not present

## 2019-01-11 DIAGNOSIS — E1165 Type 2 diabetes mellitus with hyperglycemia: Secondary | ICD-10-CM | POA: Diagnosis not present

## 2019-01-11 DIAGNOSIS — D509 Iron deficiency anemia, unspecified: Secondary | ICD-10-CM | POA: Diagnosis not present

## 2019-01-15 ENCOUNTER — Other Ambulatory Visit: Payer: Self-pay | Admitting: Cardiology

## 2019-01-30 DIAGNOSIS — J301 Allergic rhinitis due to pollen: Secondary | ICD-10-CM | POA: Diagnosis not present

## 2019-02-03 DIAGNOSIS — R269 Unspecified abnormalities of gait and mobility: Secondary | ICD-10-CM | POA: Diagnosis not present

## 2019-02-03 DIAGNOSIS — I5032 Chronic diastolic (congestive) heart failure: Secondary | ICD-10-CM | POA: Diagnosis not present

## 2019-02-04 ENCOUNTER — Other Ambulatory Visit: Payer: Self-pay | Admitting: Cardiology

## 2019-02-04 ENCOUNTER — Other Ambulatory Visit: Payer: Self-pay | Admitting: Nurse Practitioner

## 2019-02-13 ENCOUNTER — Other Ambulatory Visit: Payer: Self-pay | Admitting: Cardiology

## 2019-02-15 IMAGING — DX DG CHEST 2V
2 series · 2 of 2 positions shown · non-contrast
Comparison: Radiographs September 14, 2016.

CLINICAL DATA: Nonproductive cough.

EXAM:
CHEST  2 VIEW

[chest pa]
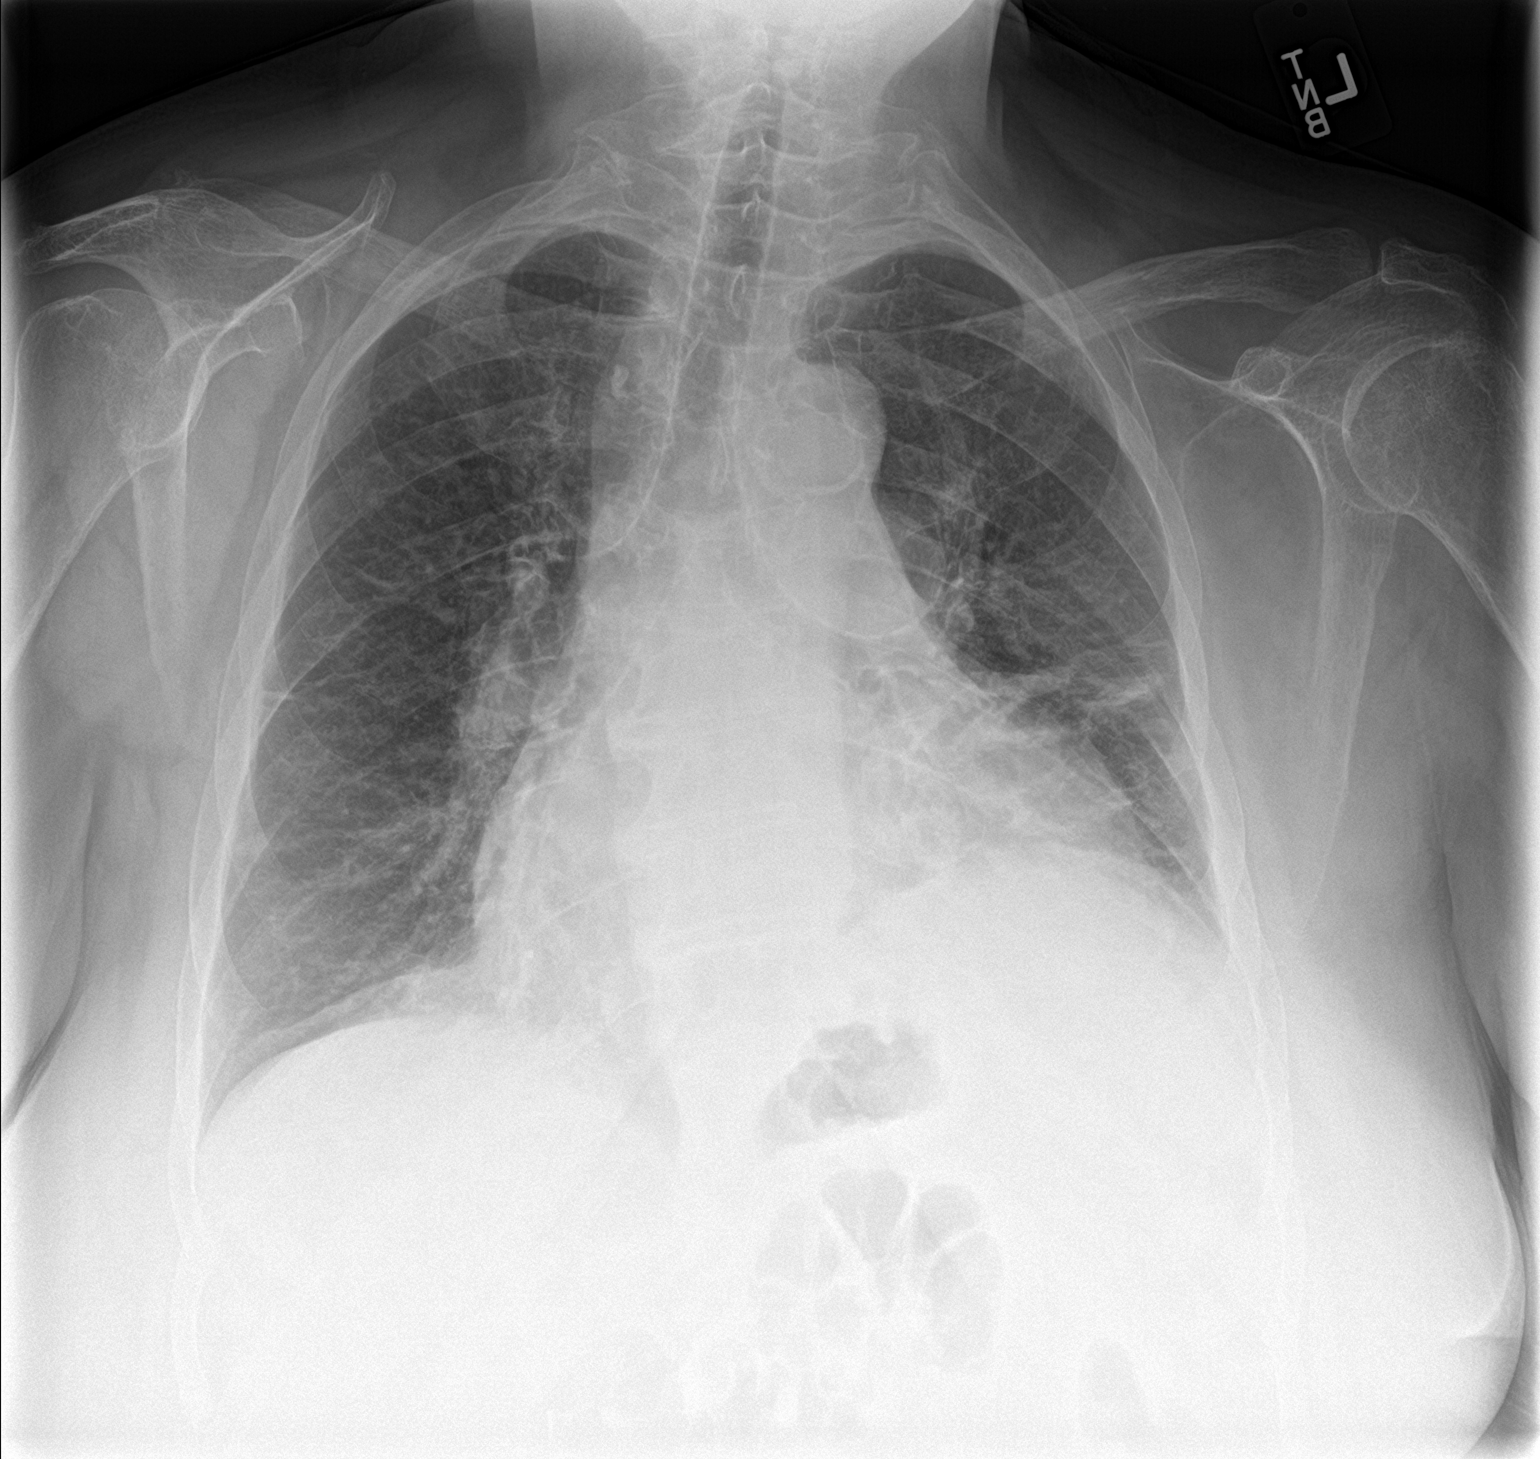

[chest lat]
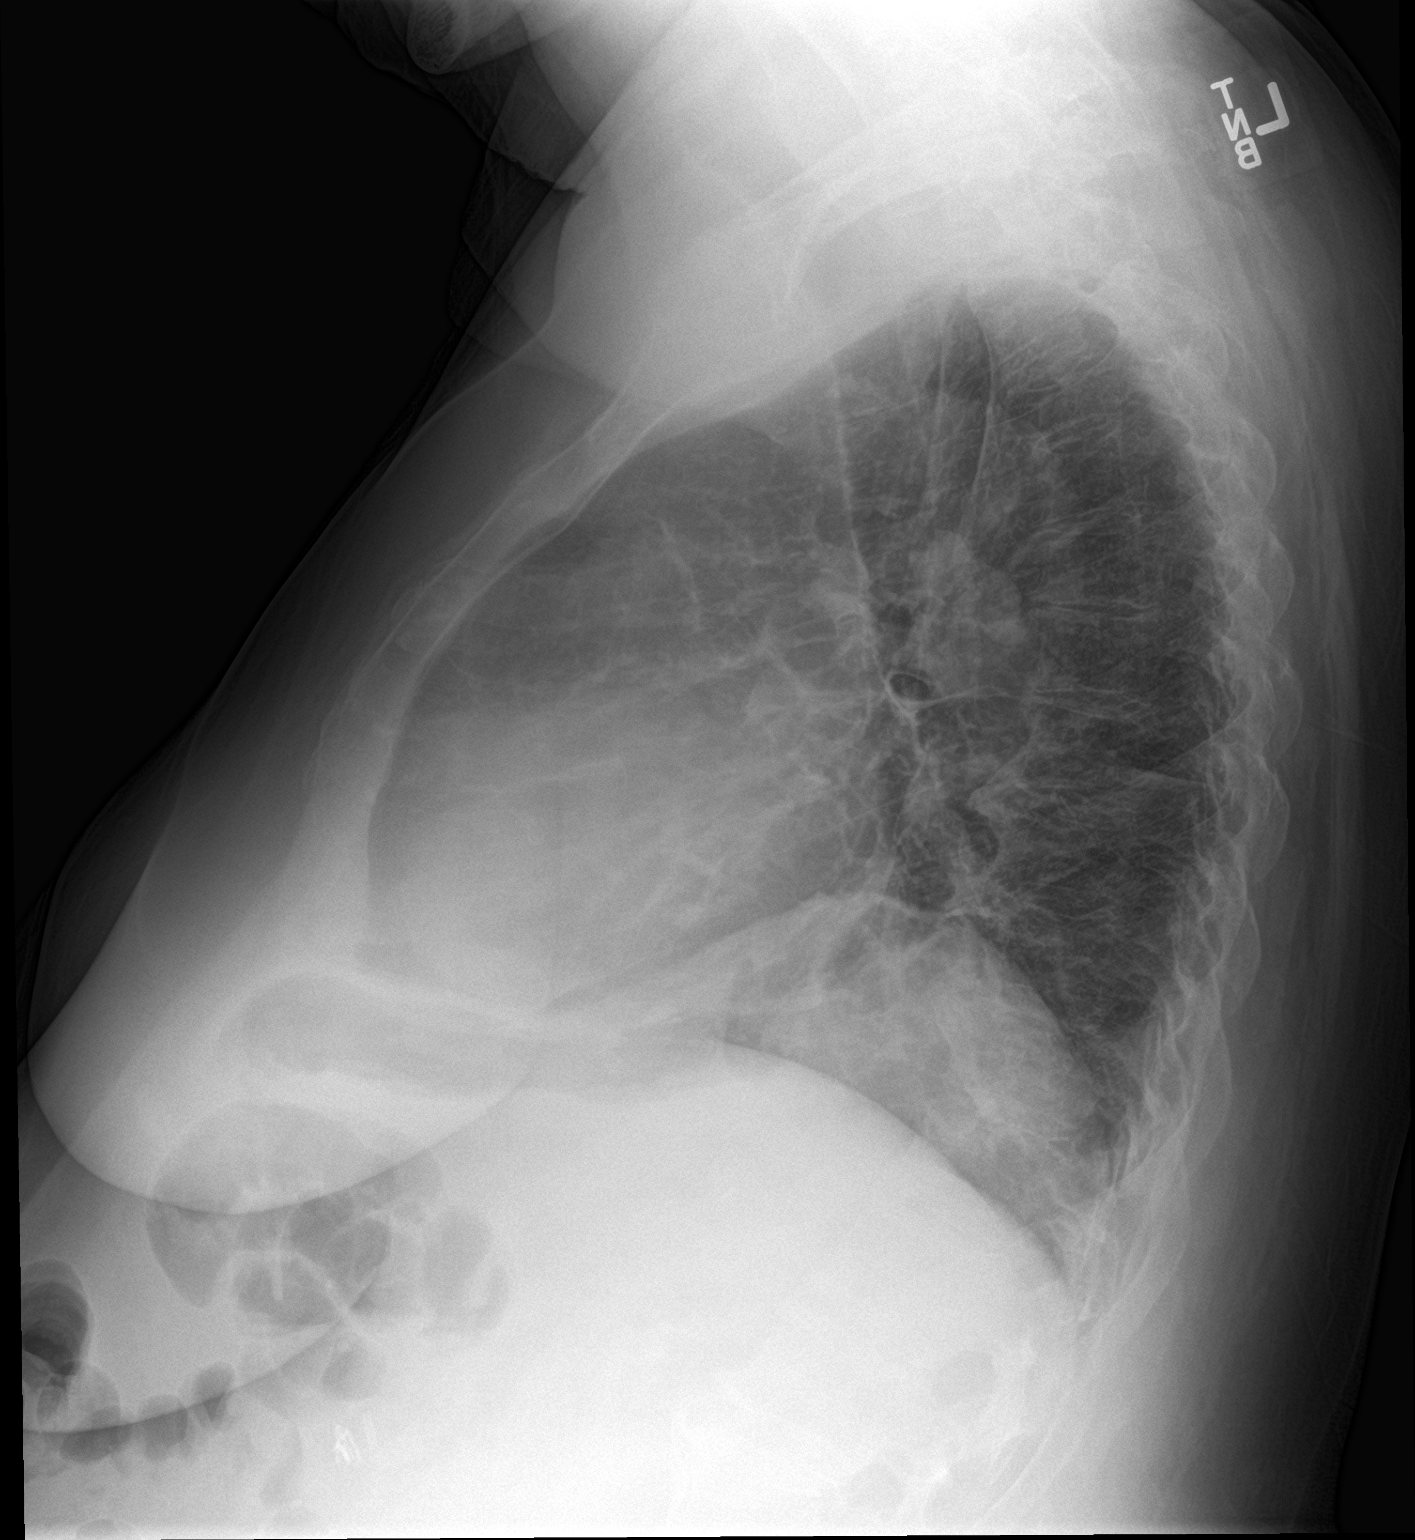

[2 of 2 positions shown; findings below may reference images not displayed]

FINDINGS: Stable cardiomegaly. Atherosclerosis of thoracic aorta is noted. No
pneumothorax is noted. No pleural effusion is noted. Focal
eventration of left hemidiaphragm is noted. Stable left basilar
density is noted which may simply represent scarring, but associated
atelectasis or infiltrate cannot be excluded. Stable density is seen
laterally in right lung base. Bony thorax is unremarkable.
IMPRESSION: Stable left basilar density is noted which may represent scarring,
but superimposed atelectasis or infiltrate cannot be excluded.

Stable nodular density seen laterally in right lung base; CT scan of
the chest is recommended to rule out possible pulmonary nodule.

## 2019-02-22 ENCOUNTER — Encounter: Payer: Self-pay | Admitting: Student

## 2019-02-22 ENCOUNTER — Telehealth (INDEPENDENT_AMBULATORY_CARE_PROVIDER_SITE_OTHER): Payer: PPO | Admitting: Student

## 2019-02-22 ENCOUNTER — Other Ambulatory Visit: Payer: Self-pay

## 2019-02-22 VITALS — Ht 68.0 in | Wt 241.0 lb

## 2019-02-22 DIAGNOSIS — I480 Paroxysmal atrial fibrillation: Secondary | ICD-10-CM

## 2019-02-22 DIAGNOSIS — Z7189 Other specified counseling: Secondary | ICD-10-CM

## 2019-02-22 DIAGNOSIS — J9611 Chronic respiratory failure with hypoxia: Secondary | ICD-10-CM

## 2019-02-22 DIAGNOSIS — I5032 Chronic diastolic (congestive) heart failure: Secondary | ICD-10-CM

## 2019-02-22 DIAGNOSIS — J9612 Chronic respiratory failure with hypercapnia: Secondary | ICD-10-CM | POA: Diagnosis not present

## 2019-02-22 DIAGNOSIS — G4733 Obstructive sleep apnea (adult) (pediatric): Secondary | ICD-10-CM | POA: Diagnosis not present

## 2019-02-22 MED ORDER — AMIODARONE HCL 200 MG PO TABS: 200.0000 mg | ORAL_TABLET | Freq: Every morning | ORAL | 11 refills | Status: DC

## 2019-02-22 MED ORDER — APIXABAN 5 MG PO TABS: 5.0000 mg | ORAL_TABLET | Freq: Two times a day (BID) | ORAL | 11 refills | Status: DC

## 2019-02-22 MED ORDER — TORSEMIDE 20 MG PO TABS: ORAL_TABLET | ORAL | 11 refills | Status: DC

## 2019-02-22 MED ORDER — POTASSIUM CHLORIDE CRYS ER 10 MEQ PO TBCR: 20.0000 meq | EXTENDED_RELEASE_TABLET | Freq: Two times a day (BID) | ORAL | 11 refills | Status: DC

## 2019-02-22 NOTE — Progress Notes (Signed)
Virtual Visit via Telephone Note   This visit type was conducted due to national recommendations for restrictions regarding the COVID-19 Pandemic (e.g. social distancing) in an effort to limit this patient's exposure and mitigate transmission in our community.  Due to her co-morbid illnesses, this patient is at least at moderate risk for complications without adequate follow up.  This format is felt to be most appropriate for this patient at this time.  The patient did not have access to video technology/had technical difficulties with video requiring transitioning to audio format only (telephone).  All issues noted in this document were discussed and addressed.  No physical exam could be performed with this format.  Please refer to the patient's chart for her  consent to telehealth for [facility].   Date:  02/22/2019   ID:  [name], DOB [birth year], MRN [id number]  Patient Location: Home Provider Location: Office  PCP:  Gareth MorganKnowlton, Steve, MD  Cardiologist:  Dina RichBranch, Jonathan, MD  Electrophysiologist:  None   Evaluation Performed:  Follow-Up Visit  Chief Complaint:  No recent symptoms  History of Present Illness:    [name] is a 68 y.o. female with past medical history of chronic combined systolic and diastolic CHF (EF 40%40% in 2016, improved to 55-60% by repeat imaging in 01/2015 and 60-65% by echo in 11/2017), paroxysmal atrial fibrillation, HTN, and OSA who presents for a follow-up telehealth visit.  She was last examined by Dr. Wyline MoodBranch in 12/2017 following a recent admission for a CHF exacerbation.  Her weight was at 232 lbs on her home scales and she was taking Torsemide 40mg  in AM/20mg  in PM. She was continued on her current medication regimen at that time and informed to follow-up in 2 months but has not been evaluated since.  In the interim, she was admitted to [facility] in 08/2018 for community-acquired pneumonia and a COPD exacerbation.  She was treated with antibiotic  therapy and a steroid taper. It does not appear changes were made to her cardiac medications at that time.  In talking with the patient and her daughter today, she reports overall doing well from a cardiac perspective since her last office visit. Weight has been stable at approximately 240 lbs on her home scales. She denies any specific orthopnea, PND, or lower extremity edema. She has baseline dyspnea on exertion but denies any recent change in this. She uses 2 L nasal cannula intermittently as needed during the day and then consistently at nighttime. She denies any recent chest pain or palpitations.  Reports good compliance with her medications and denies missing any doses of Eliquis. No recent melena, hematochezia, or hematuria.  The patient does not have symptoms concerning for COVID-19 infection (fever, chills, cough, or new shortness of breath).    Past Medical History:  Diagnosis Date  . Abnormal pulmonary function test   . Anemia    H/H of 10/30 with a normal MCV in 12/09  . Anxiety   . Arthritis   . Barrett's esophagus    Diagnosed 1995. Last EGD 2016-NO BARRETT'S.   . Chest pain    Negative cardiac catheterization in 2002; negative stress nuclear study in 2008  . Chronic anticoagulation   . Chronic combined systolic and diastolic CHF (congestive heart failure) (HCC)    a. EF predominantly normal during prior echoes but was 40% during 10/2014 echo. b. Most recent 01/2015 EF normal, 55-60%.  . Chronic LBP    Surgical intervention in 1996  . Diabetes  mellitus, type 2 (HCC)    Insulin therapy; exacerbated by prednisone  . Dysrhythmia    AFib  . Gastroparesis    99% retention 05/2008 on GES  . GERD (gastroesophageal reflux disease)   . Hiatal hernia   . Hyperlipidemia   . Hypertension   . Hypothyroid   . IBS (irritable bowel syndrome)   . Obesity   . OSA on CPAP    had CPAP and cannot tolerate.  . Paroxysmal atrial fibrillation (HCC)   . Pulmonary hypertension (HCC) 01/2015    a. Predominantly pulmonary venous hypertension but may be component of PAH.  . Seizures (HCC)    last seizure was 2 years ago; on keppra for this; unknown etiology  . Syncope    a. Admitted 05/2009; magnetic resonance imagin/ MRA - negative; etiology thought to be orthostasis secondary to drugs and dehydration. b. Syncope 02/2015 also felt 2/2 dehydration.   Past Surgical History:  Procedure Laterality Date  . BACK SURGERY  1996  . BIOPSY N/A 11/08/2013   Procedure: BIOPSY  / Tissue sampling / ulcers present in small intestine;  Surgeon: West BaliSandi L Fields, MD;  Location: [facility];  Service: Endoscopy;  Laterality: N/A;  . CARDIAC CATHETERIZATION  2002  . CARDIAC CATHETERIZATION N/A 01/26/2015   Procedure: Right Heart Cath;  Surgeon: Laurey Moralealton S McLean, MD;  [facility];  Service: Cardiovascular;  Laterality: N/A;  . CARDIOVASCULAR STRESS TEST  2008   Stress nuclear study  . CARDIOVERSION N/A 03/06/2015   Procedure: CARDIOVERSION;  Surgeon: Laurey Moralealton S McLean, MD;  [facility];  Service: Cardiovascular;  Laterality: N/A;  . CARPAL TUNNEL RELEASE  1994  . COLONOSCOPY  11/2011   Dr. Darrick Pennafields: Internal hemorrhoids, mild diverticulosis. Random colon biopsies negative.  . COLONOSCOPY N/A 11/08/2013   SLF: Normal mucosa in the terminal ileum/The colon IS redundant/  Moderate diverticulosis throughout the entire colon. ileum bx benign. colon bx benign  . ESOPHAGOGASTRODUODENOSCOPY  2008   Barrett's without dysplasia. esphagus dilated. antral erosions, h.pylori serologies negative.  . ESOPHAGOGASTRODUODENOSCOPY  11/2011   Dr. Darrick PennaFields: Barrett's esophagus, mild gastritis, diverticulum in the second portion of the duodenum repeat EGD 3 years. Small bowel biopsies negative. Gastric biopsy show reactive gastropathy but no H. pylori. Esophageal biopsies consistent with GERD. Next EGD 11/2014  . ESOPHAGOGASTRODUODENOSCOPY N/A 11/21/2014   ZOX:WRUESLF:mild non-erosive gastritis/irregular z-line  .  GIVENS CAPSULE STUDY  12/07/2011   Proximal small bowel, rare AVM. Distal small bowel, multiple ulcers noted  . GIVENS CAPSULE STUDY N/A 09/27/2013   Distal small bowel ulcers extending to TI.  Marland Kitchen. GIVENS CAPSULE STUDY N/A 10/10/2013   Procedure: GIVENS CAPSULE STUDY;  Surgeon: West BaliSandi L Fields, MD;  Location: [facility];  Service: Endoscopy;  Laterality: N/A;  7:30  . IR KYPHO THORACIC WITH BONE BIOPSY  02/09/2018  . KNEE ARTHROSCOPY WITH MEDIAL MENISECTOMY Right 06/09/2016   Procedure: KNEE ARTHROSCOPY WITH MEDIAL MENISECTOMY;  Surgeon: Vickki HearingStanley E Harrison, MD;  Location: [facility] ORS;  Service: Orthopedics;  Laterality: Right;  medial and lateral menisectomy  . LAMINECTOMY  1995   L4-L5  . LAPAROSCOPIC CHOLECYSTECTOMY  1990s  . LEFT HEART CATHETERIZATION WITH CORONARY ANGIOGRAM  01/10/2014   Procedure: LEFT HEART CATHETERIZATION WITH CORONARY ANGIOGRAM;  Surgeon: Lesleigh NoeHenry W Smith III, MD;  [facility];  Service: Cardiovascular;;  . RIGHT HEART CATHETERIZATION N/A 01/10/2014   Procedure: RIGHT HEART CATH;  Surgeon: Lesleigh NoeHenry W Smith III, MD;  [facility];  Service:  Cardiovascular;  Laterality: N/A;  . TOTAL ABDOMINAL HYSTERECTOMY  1999  . TRACHEOSTOMY TUBE PLACEMENT N/A 09/01/2017   Procedure: TRACHEOSTOMY;  Surgeon: Drema HalonNewman, Christopher E, MD;  [facility];  Service: ENT;  Laterality: N/A;     Current Meds  Medication Sig  . acetaminophen (TYLENOL) 500 MG tablet Take 500 mg by mouth every 6 (six) hours as needed for moderate pain or headache.  . albuterol (PROVENTIL) (2.5 MG/3ML) 0.083% nebulizer solution Take 3 mLs (2.5 mg total) by nebulization every 6 (six) hours as needed for wheezing or shortness of breath.  Marland Kitchen. amiodarone (PACERONE) 200 MG tablet Take 1 tablet (200 mg total) by mouth every morning.  Marland Kitchen. apixaban (ELIQUIS) 5 MG TABS tablet Take 1 tablet (5 mg total) by mouth 2 (two) times daily.  . Cholecalciferol (VITAMIN D3) 2000 units capsule Take 2,000 Units by mouth daily.  Marland Kitchen.  dicyclomine (BENTYL) 10 MG capsule Take 2 capsules (20 mg total) by mouth 2 (two) times daily.  . DULoxetine (CYMBALTA) 30 MG capsule Take 30 mg by mouth 2 (two) times daily.  Marland Kitchen. gabapentin (NEURONTIN) 400 MG capsule Take 400-800 mg by mouth See admin instructions. Take 1 capsule(400mg ) by mouth twice a day and take 2 capsules(800mg ) by mouth at bedtime  . glipiZIDE (GLUCOTROL) 10 MG tablet Take 10 mg by mouth 2 (two) times daily.  Marland Kitchen. HYDROcodone-acetaminophen (NORCO) 10-325 MG tablet Take 1 tablet by mouth 4 (four) times daily.  . insulin aspart (NOVOLOG) 100 UNIT/ML injection Inject 0-20 Units into the skin every 4 (four) hours. CBG 0-120--no units; 121-150--3 units; 151-200--4 units; 201-250--7units; 251-300--11 units; 301-350--15 units; 351-400--20 units  . insulin glargine (LANTUS) 100 UNIT/ML injection Inject 0.55 mLs (55 Units total) into the skin at bedtime.  . levETIRAcetam (KEPPRA) 1000 MG tablet Take 1,500 mg by mouth 2 (two) times daily.   Marland Kitchen. levothyroxine (SYNTHROID, LEVOTHROID) 137 MCG tablet Take 137 mcg by mouth daily.  Marland Kitchen. loperamide (IMODIUM) 2 MG capsule TAKE ONE CAPSULE BY MOUTH TWICE DAILY AS NEEDED  . LORazepam (ATIVAN) 1 MG tablet Take 1 mg by mouth 3 (three) times daily as needed. for anxiety  . magnesium oxide (MAG-OX) 400 MG tablet Take 1 tablet (400 mg total) by mouth 2 (two) times daily.  . meclizine (ANTIVERT) 25 MG tablet Take 25 mg by mouth as needed.   . metFORMIN (GLUCOPHAGE) 500 MG tablet Take 500 mg by mouth 2 (two) times daily with a meal.  . Multiple Vitamin (MULTIVITAMIN WITH MINERALS) TABS tablet Take 1 tablet by mouth daily.  . naproxen sodium (ALEVE) 220 MG tablet Take 440 mg by mouth as needed (pain).   . ondansetron (ZOFRAN) 4 MG tablet TAKE 1 TABLET BY MOUTH FOUR TIMES DAILY AS NEEDED FOR NAUSEA (Patient taking differently: Take 4 mg by mouth 4 (four) times daily as needed for nausea. )  . OXYGEN 2lpm with sleep and as needed during the day  . pantoprazole  (PROTONIX) 40 MG tablet TAKE ONE TABLET BY MOUTH TWICE DAILY BEFORE APPLY MEAL (Patient taking differently: Take 40 mg by mouth 2 (two) times daily. )  . potassium chloride (K-DUR) 10 MEQ tablet Take 2 tablets (20 mEq total) by mouth 2 (two) times daily.  Marland Kitchen. torsemide (DEMADEX) 20 MG tablet TAKE TWO TABLETS BY MOUTH EVERY MORNING AND TAKE 1 TABLET BY MOUTH EVERY EVENING (MAY TAKE EXTRA TABLET AS NEEDED FOR SWELLING)  . [DISCONTINUED] amiodarone (PACERONE) 200 MG tablet TAKE ONE TABLET BY MOUTH EVERY MORNING  . [DISCONTINUED]  apixaban (ELIQUIS) 5 MG TABS tablet Take 1 tablet (5 mg total) by mouth 2 (two) times daily.  . [DISCONTINUED] potassium chloride (K-DUR) 10 MEQ tablet TAKE TWO TABLETS BY MOUTH TWICE DAILY  . [DISCONTINUED] torsemide (DEMADEX) 20 MG tablet TAKE TWO TABLETS BY MOUTH EVERY MORNING AND TAKE 1 TABLET BY MOUTH EVERY EVENING (MAY TAKE EXTRA TABLET AS NEEDED FOR SWELLING)     Allergies:   Celexa [citalopram hydrobromide], Codeine, Dilaudid [hydromorphone hcl], and Reglan [metoclopramide]   Social History   Tobacco Use  . Smoking status: Former Smoker    Packs/day: 0.25    Years: 15.00    Pack years: 3.75    Types: Cigarettes    Start date: 02/26/1966    Quit date: 07/01/1983    Years since quitting: 35.6  . Smokeless tobacco: Never Used  . Tobacco comment: quit in 1984  Substance Use Topics  . Alcohol use: No    Alcohol/week: 0.0 standard drinks  . Drug use: No     Family Hx: The patient's family history includes Alzheimer's disease in her mother; Breast cancer in her sister; Heart attack in her mother; Hypertension in her mother and another family member; Stroke in her mother. There is no history of Heart disease or Colon cancer.  ROS:   Please see the history of present illness.     All other systems reviewed and are negative.   Prior CV studies:   The following studies were reviewed today:  Echocardiogram: 11/2017 Study Conclusions  - Left ventricle: The  cavity size was normal. There was moderate   concentric hypertrophy. Systolic function was normal. The   estimated ejection fraction was in the range of 60% to 65%. Wall   motion was normal; there were no regional wall motion   abnormalities. Doppler parameters are consistent with abnormal   left ventricular relaxation (grade 1 diastolic dysfunction). - Aortic valve: Transvalvular velocity was within the normal range.   There was no stenosis. There was trivial regurgitation. - Mitral valve: Transvalvular velocity was within the normal range.   There was no evidence for stenosis. There was no regurgitation. - Left atrium: The atrium was severely dilated. - Right ventricle: The cavity size was normal. Wall thickness was   normal. Systolic function was normal. - Tricuspid valve: There was mild regurgitation. - Pulmonary arteries: PA peak pressure: 46 mm Hg (S).  Labs/Other Tests and Data Reviewed:    EKG:  An ECG dated 08/31/2018 was personally reviewed today and demonstrated:  NSR with 1st degree AV Block, HR 60. No acute ST changes.   Recent Labs: 05/11/2018: ALT 14 09/02/2018: BUN 8; Creatinine, Ser 0.83; Hemoglobin 12.8; Platelets 185; Potassium 3.6; Sodium 138   Recent Lipid Panel Lab Results  Component Value Date/Time   CHOL 167 02/16/2018 10:43 AM   TRIG 179 (H) 02/16/2018 10:43 AM   HDL 38 (L) 02/16/2018 10:43 AM   CHOLHDL 4.4 02/16/2018 10:43 AM   LDLCALC 93 02/16/2018 10:43 AM    Wt Readings from Last 3 Encounters:  02/22/19 241 lb (109.3 kg)  08/31/18 235 lb 1.6 oz (106.6 kg)  08/25/18 243 lb (110.2 kg)     Objective:    Vital Signs:  Ht 5\' 8"  (1.727 m)   Wt 241 lb (109.3 kg)   BMI 36.64 kg/m    General: Pleasant female sounding in NAD Psych: Normal affect. Neuro: Alert and oriented X 3.  Lungs:  Resp regular and unlabored while talking on the phone.  ASSESSMENT & PLAN:    1. Chronic Diastolic CHF - EF previously reduced at 40% in 2016 but improved to 60 to  65% by repeat imaging in 11/2017. She denies any recent orthopnea, PND, or lower extremity edema. Weight has overall been stable at approximately 240 lbs on her home scales.  - continue Torsemide 40mg  in AM/20mg  in PM along with potassium supplementation.  2. Paroxysmal Atrial Fibrillation - She denies any recent palpitations. Remains on miodarone 200 mg daily.  Reviewed the need for repeat TSH and LFT's and she is scheduled for follow-up labs with her PCP next week. Will request a copy of these once available. Also reviewed the need for annual eye examinations. - She denies any evidence of active bleeding. Continue Eliquis 5 mg twice daily for anticoagulation.  She does take Naproxen as needed for arthritis and we reviewed trying to switch this to Tylenol to reduce the risk of bleeding.  3. Chronic Hypoxic Respiratory Failure/OSA - intolerant to CPAP in the past. Utilizes 2L [place] at night and as needed during the day.    COVID-19 Education: The signs and symptoms of COVID-19 were discussed with the patient and how to seek care for testing (follow up with PCP or arrange E-visit).  The importance of social distancing was discussed today.  Time:   Today, I have spent 23 minutes with the patient with telehealth technology discussing the above problems.     Medication Adjustments/Labs and Tests Ordered: Current medicines are reviewed at length with the patient today.  Concerns regarding medicines are outlined above.   Tests Ordered: No orders of the defined types were placed in this encounter.   Medication Changes: Meds ordered this encounter  Medications  . amiodarone (PACERONE) 200 MG tablet    Sig: Take 1 tablet (200 mg total) by mouth every morning.    Dispense:  30 tablet    Refill:  11    Order Specific Question:   Supervising Provider    Answer:   Lars Masson [id number]  . apixaban (ELIQUIS) 5 MG TABS tablet    Sig: Take 1 tablet (5 mg total) by mouth 2 (two) times daily.     Dispense:  30 tablet    Refill:  11    Order Specific Question:   Supervising Provider    Answer:   Lars Masson [id number]  . potassium chloride (K-DUR) 10 MEQ tablet    Sig: Take 2 tablets (20 mEq total) by mouth 2 (two) times daily.    Dispense:  120 tablet    Refill:  11    Order Specific Question:   Supervising Provider    Answer:   Lars Masson [id number]  . torsemide (DEMADEX) 20 MG tablet    Sig: TAKE TWO TABLETS BY MOUTH EVERY MORNING AND TAKE 1 TABLET BY MOUTH EVERY EVENING (MAY TAKE EXTRA TABLET AS NEEDED FOR SWELLING)    Dispense:  90 tablet    Refill:  11    Order Specific Question:   Supervising Provider    Answer:   Lars Masson [[id number]]    Follow Up:  In Person in 6 month(s)  Signed, Ellsworth Lennox, PA-C  02/22/2019 4:51 PM    [facility]

## 2019-02-22 NOTE — Patient Instructions (Signed)
Medication Instructions:  Your physician recommends that you continue on your current medications as directed. Please refer to the Current Medication list given to you today.   Labwork: NONE  Testing/Procedures: NONE  Follow-Up: Your physician wants you to follow-up in: 6 MONTHS.  You will receive a reminder letter in the mail two months in advance. If you don't receive a letter, please call our office to schedule the follow-up appointment.   Any Other Special Instructions Will Be Listed Below (If Applicable).     If you need a refill on your cardiac medications before your next appointment, please call your pharmacy.   

## 2019-02-26 IMAGING — CT CT CHEST W/ CM
2 of 3 series · 15 of 36 positions shown, 18 images · IV contrast (iopamidol)
Comparison: 09/30/2016 chest radiograph.   12/20/2013 chest CT.

CLINICAL DATA: Indeterminate small nodular density in the right
lower lung on recent chest radiograph. Recent pneumonia.

EXAM:
CT CHEST WITH CONTRAST
TECHNIQUE: Multidetector CT imaging of the chest was performed during
intravenous contrast administration.
CONTRAST:  75mL [id number] IOPAMIDOL (FIAZ5L-E22) INJECTION 61%

[Series 2: axial st · axial · 0.72mm/px · z∈[+1312,+1550]mm · 12 of 141 slices shown, 15 images]
[im 11/141  mediastinal]
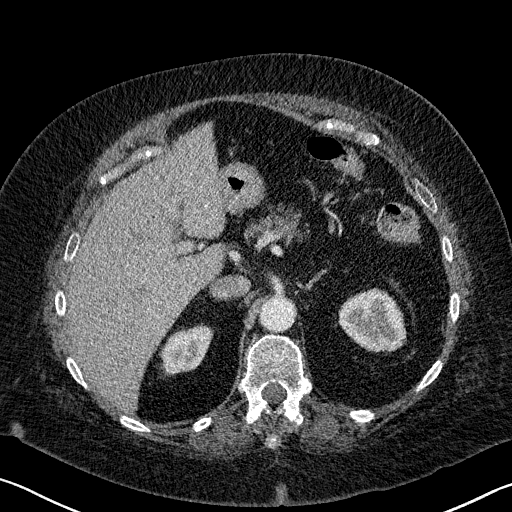
[im 11/141  lung]
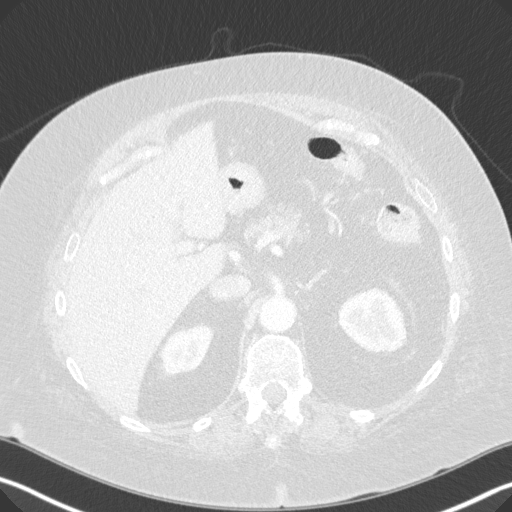
[im 21/141  lung]
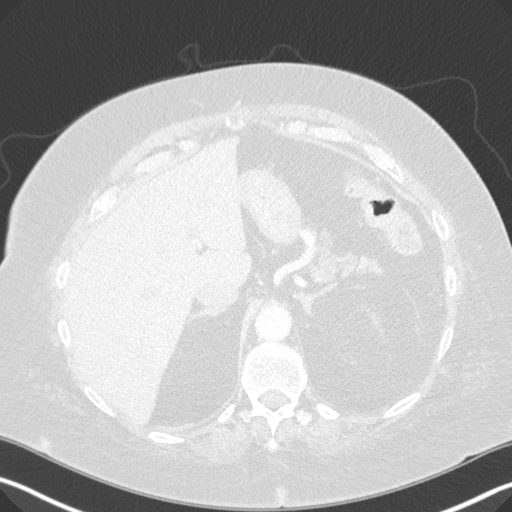
[im 32/141  lung]
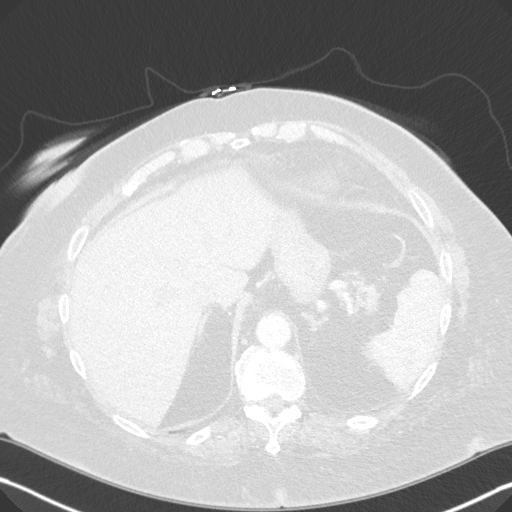
[im 42/141  lung]
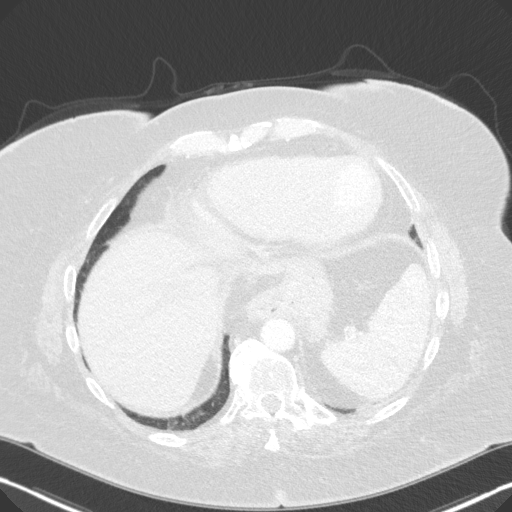
[im 52/141  mediastinal]
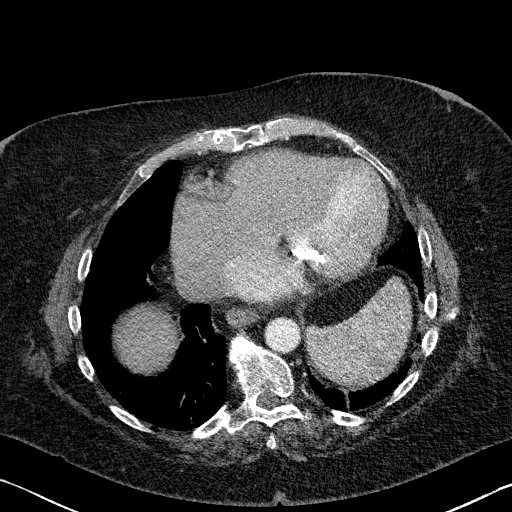
[im 52/141  lung]
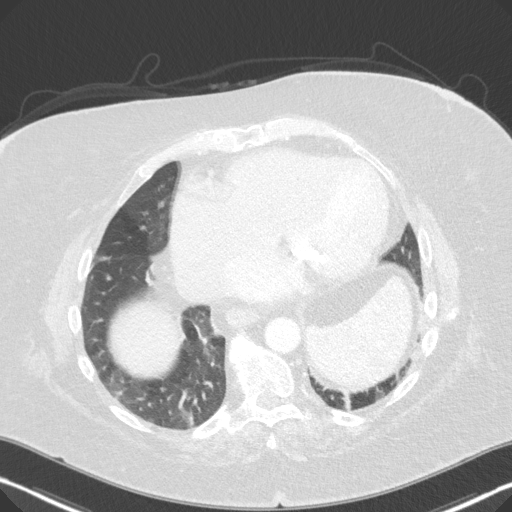
[im 63/141  lung]
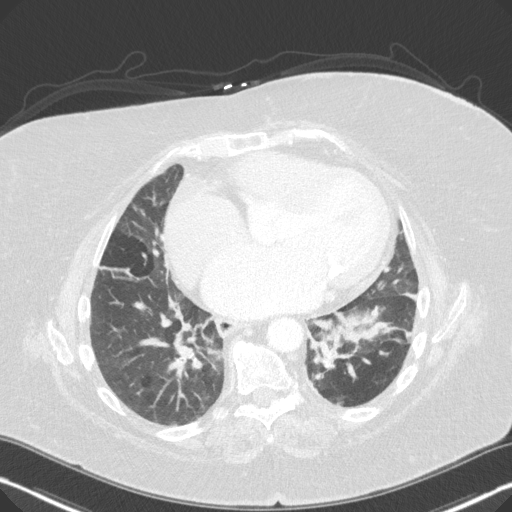
[im 78/141  lung]
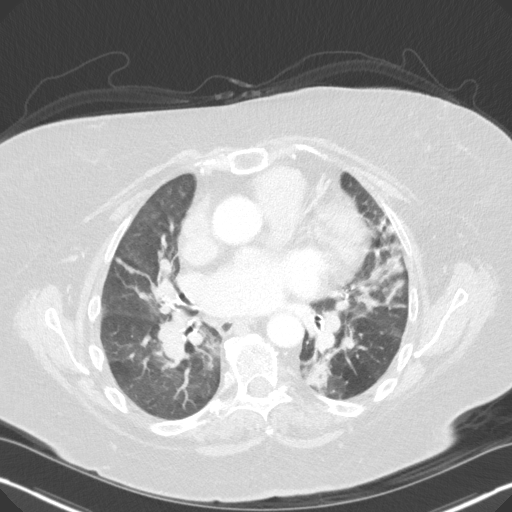
[im 89/141  lung]
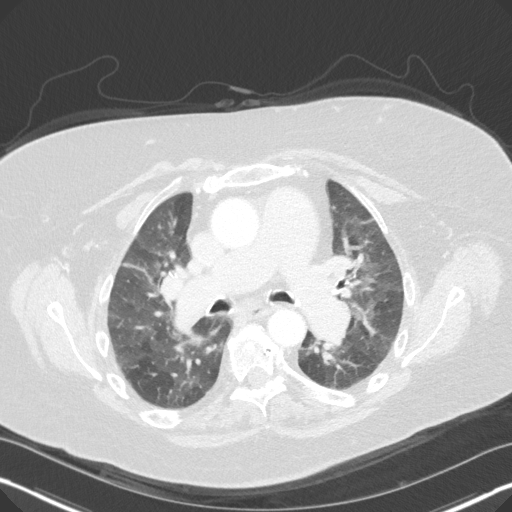
[im 99/141  mediastinal]
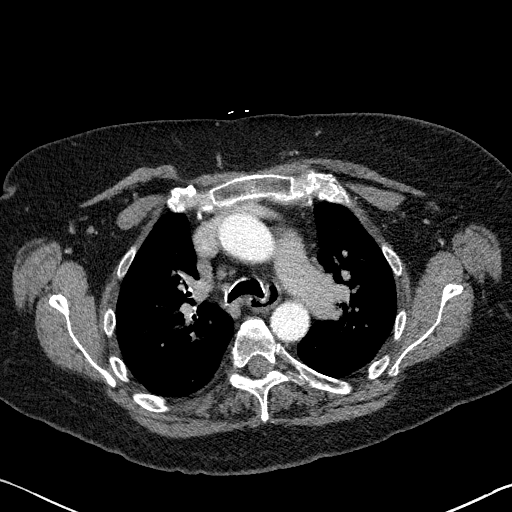
[im 99/141  lung]
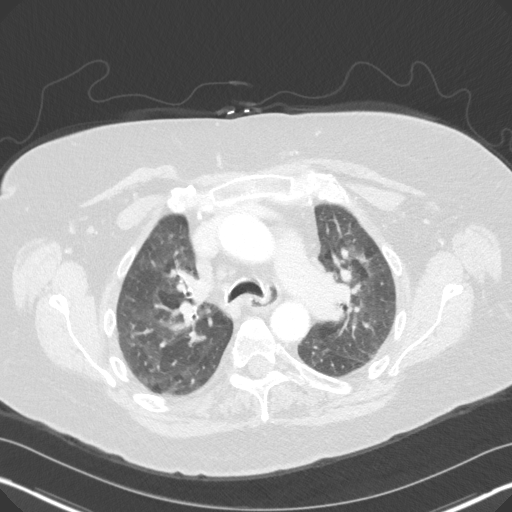
[im 109/141  lung]
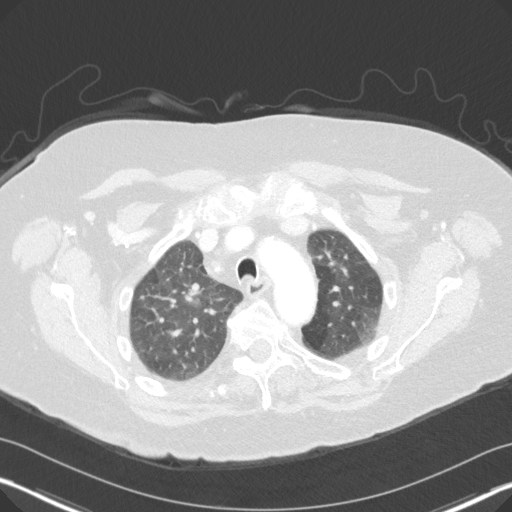
[im 120/141  lung]
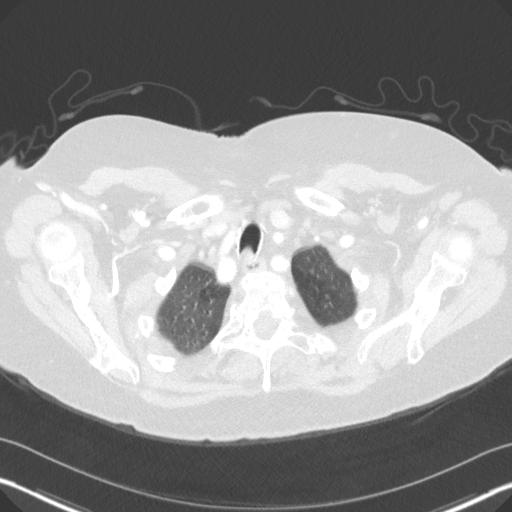
[im 130/141  lung]
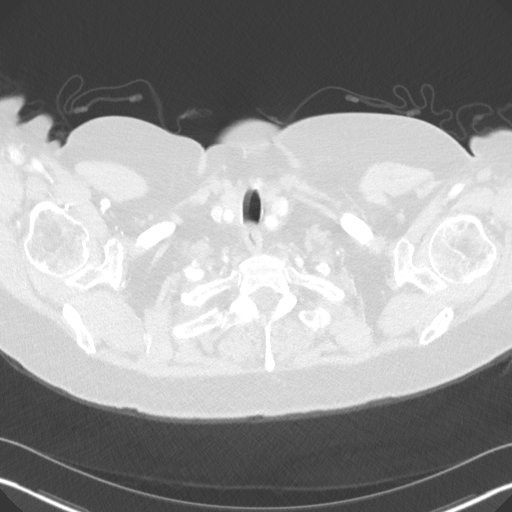

[Series 5: coronal · coronal · 0.60mm/px · 3 of 143 slices shown]
[im 29/143  lung]
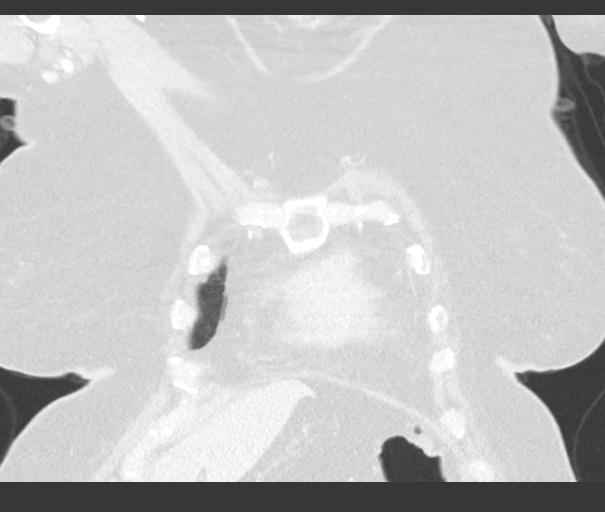
[im 57/143  lung]
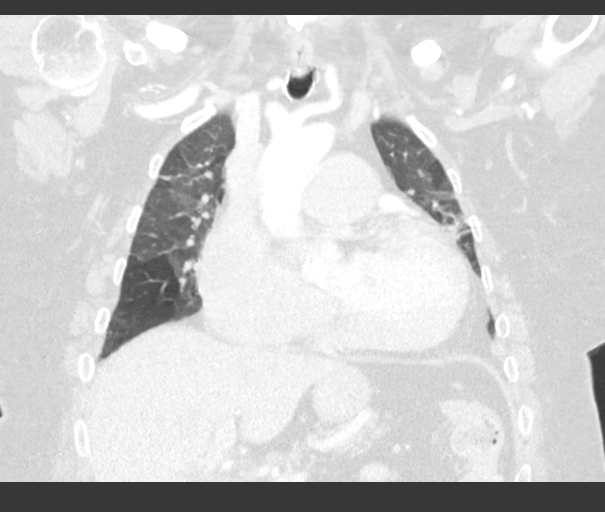
[im 86/143  lung]
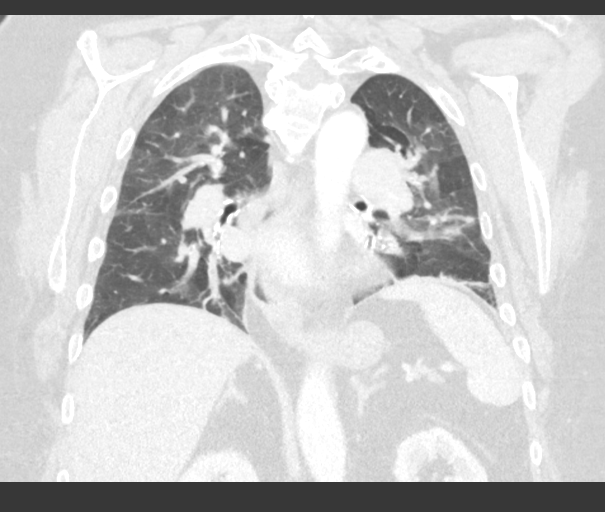

[15 of 36 positions shown; findings below may reference images not displayed]

FINDINGS: Motion degraded scan .

Cardiovascular: Stable cardiomegaly. No significant pericardial
fluid/thickening. Atherosclerotic nonaneurysmal thoracic aorta.
Stable dilated main pulmonary artery (4.2 cm diameter). No central
pulmonary emboli. Peripheral pulmonary artery branches are tortuous.

Mediastinum/Nodes: Atrophic appearing thyroid gland with no discrete
thyroid nodules. Unremarkable esophagus. No pathologically enlarged
axillary, mediastinal or hilar lymph nodes.

Lungs/Pleura: No pneumothorax. No pleural effusion. Mosaic
attenuation throughout both lungs, unchanged. Diffuse bronchial wall
thickening. There is extensive parenchymal banding in the mid to
lower lungs bilaterally, compatible with a combination of
postinfectious/ postinflammatory scarring and subsegmental
atelectasis. No acute consolidative airspace disease, lung masses or
significant pulmonary nodules in the aerated portions of the lungs.

Upper abdomen: Unremarkable.

Musculoskeletal: No aggressive appearing focal osseous lesions.
Moderate thoracic spondylosis.
IMPRESSION: 1. No acute consolidative airspace disease, lung masses or
significant pulmonary nodules in the limited aerated portions of the
lungs on this motion degraded scan.
2. Extensive parenchymal banding in the mid to lower lungs
bilaterally, compatible with a combination
postinfectious/postinflammatory scarring and subsegmental
atelectasis .
3. Stable cardiomegaly. Stable findings of chronic pulmonary
arterial hypertension including prominently dilated main pulmonary
artery (4.2 cm diameter) and tortuous peripheral pulmonary artery
branches.
4. Mosaic attenuation throughout both lungs, unchanged, probably due
to mosaic perfusion given the pulmonary vascular disease.
5. Aortic atherosclerosis.

## 2019-03-06 DIAGNOSIS — I5032 Chronic diastolic (congestive) heart failure: Secondary | ICD-10-CM | POA: Diagnosis not present

## 2019-03-06 DIAGNOSIS — R269 Unspecified abnormalities of gait and mobility: Secondary | ICD-10-CM | POA: Diagnosis not present

## 2019-03-13 DIAGNOSIS — R2689 Other abnormalities of gait and mobility: Secondary | ICD-10-CM | POA: Diagnosis not present

## 2019-03-13 DIAGNOSIS — G3184 Mild cognitive impairment, so stated: Secondary | ICD-10-CM | POA: Diagnosis not present

## 2019-03-13 DIAGNOSIS — M797 Fibromyalgia: Secondary | ICD-10-CM | POA: Diagnosis not present

## 2019-03-13 DIAGNOSIS — M25561 Pain in right knee: Secondary | ICD-10-CM | POA: Diagnosis not present

## 2019-03-26 DIAGNOSIS — E119 Type 2 diabetes mellitus without complications: Secondary | ICD-10-CM | POA: Diagnosis not present

## 2019-03-26 DIAGNOSIS — E039 Hypothyroidism, unspecified: Secondary | ICD-10-CM | POA: Diagnosis not present

## 2019-03-26 DIAGNOSIS — E1165 Type 2 diabetes mellitus with hyperglycemia: Secondary | ICD-10-CM | POA: Diagnosis not present

## 2019-03-26 DIAGNOSIS — E1142 Type 2 diabetes mellitus with diabetic polyneuropathy: Secondary | ICD-10-CM | POA: Diagnosis not present

## 2019-04-05 DIAGNOSIS — R269 Unspecified abnormalities of gait and mobility: Secondary | ICD-10-CM | POA: Diagnosis not present

## 2019-04-05 DIAGNOSIS — I5032 Chronic diastolic (congestive) heart failure: Secondary | ICD-10-CM | POA: Diagnosis not present

## 2019-04-30 ENCOUNTER — Ambulatory Visit: Payer: PPO | Admitting: Gastroenterology

## 2019-05-02 ENCOUNTER — Encounter: Payer: Self-pay | Admitting: Gastroenterology

## 2019-05-02 ENCOUNTER — Ambulatory Visit (INDEPENDENT_AMBULATORY_CARE_PROVIDER_SITE_OTHER): Payer: PPO | Admitting: Nurse Practitioner

## 2019-05-02 ENCOUNTER — Encounter: Payer: Self-pay | Admitting: Nurse Practitioner

## 2019-05-02 ENCOUNTER — Other Ambulatory Visit: Payer: Self-pay

## 2019-05-02 DIAGNOSIS — K529 Noninfective gastroenteritis and colitis, unspecified: Secondary | ICD-10-CM | POA: Diagnosis not present

## 2019-05-02 DIAGNOSIS — D649 Anemia, unspecified: Secondary | ICD-10-CM

## 2019-05-02 DIAGNOSIS — K3184 Gastroparesis: Secondary | ICD-10-CM

## 2019-05-02 NOTE — Addendum Note (Signed)
Addended by: Gordy Levan, Rosamaria Donn A on: 05/02/2019 11:17 AM   Modules accepted: Orders

## 2019-05-02 NOTE — Assessment & Plan Note (Signed)
History of iron deficiency anemia.  Her last CBC in March showed again normal hemoglobin.  Her last ferritin in 2019 was low normal at 13.  At this point I would like to update her CBC and ferritin.  We will call her with results.  Further recommendations to follow.  Follow-up in 6 weeks.

## 2019-05-02 NOTE — Assessment & Plan Note (Signed)
Acute on chronic diarrhea with a history of IBS-D.  She began having worsening loose stools 2 weeks ago.  Denies healthcare exposure recent antibiotics.  Denies hematochezia or melena.  She is having about 4-5 stools a day.  She has tried to increase her Bentyl and is taking Imodium twice a day, neither of these have helped.  At this point I will have her check a C. difficile and GI pathogen panel.  Further recommendations to follow.  As an aside it is somewhat concerning that her acute on chronic diarrhea began 2 weeks ago along with a cough and sore throat.  I recommended she reach out to her primary care to discuss.  She seems well, healthy and not in distress when I spoke with her today.  However, cannot rule out COVID-19.

## 2019-05-02 NOTE — Assessment & Plan Note (Signed)
Gastroparesis currently well managed.  Notify us of any worsening symptoms.  Follow-up in 6 weeks.

## 2019-05-02 NOTE — Progress Notes (Signed)
Referring Provider: Gareth MorganKnowlton, Steve, MD Primary Care Physician:  Gareth MorganKnowlton, Steve, MD Primary GI:  Dr. Darrick PennaFields  NOTE: Service was provided via telemedicine and was requested by the patient due to COVID-19 pandemic.  Method of visit: Telephone  Patient Location: Home  Provider Location: Office  Reason for Phone Visit: Follow-up  The patient was consented to phone follow-up via telephone encounter including billing of the encounter (yes/no): yes  Persons present on the phone encounter, with roles: Daughter  Total time (minutes) spent on medical discussion: 22 minutes  Chief Complaint  Patient presents with   ida    f/u.   gastroparesis    f/u. C/o diarrhea x 2 weeks, daily, a lot of stool coming out. Denies blood in stool. Wonders if could be related to back fracture she has    HPI:   [name] is a 68 y.o. female who presents for virtual visit regarding: IDA and gastroparesis. The patient was last seen in our office 10/26/2018 for chronic diarrhea and gastroparesis.  The patient's last visit was a virtual office visit due to COVID-19/coronavirus pandemic.  At that time noted chronic history of abdominal pain, gastroparesis, IBS-D, IDA.  EGD last completed in May 2016 for anemia and history of Barrett's without evidence of Barrett's.  Last colonoscopy in May 2015.  BPE noted small to moderate sized sliding-type hiatal hernia with spontaneous and inducible reflux, nonspecific esophageal motility.  Capsule study completed for completeness in April 2015 with idiopathic small bowel ulcers that have been present since 2013.  At her last visit she noted no vomiting with gastroparesis on Zofran 4 times a day which controls her nausea.  Her IBS-D was baseline, takes Bentyl twice daily.  Related to anemia she has been unable to tolerate oral iron in the past and she is followed by hematology.  Denies overt GI bleed.  Rare/intermittent pill dysphagia.  GERD controlled with twice daily  PPI.  Recommended follow-up in 6 months.  Today she states she's doing ok overall. Has been having worsening diarrhea for the last 2 weeks, increased Bentyl to 4 times a day which is not helping. Also taking Imodium twice daily (morning and night). No improvement. Having diarrhea 3-4 times a day. Stools consistent with Bristol 5-6. Has not had any healthcare exposure. Has not been on any recent antibiotics. Does have intermittent sore throat for which she was taking Mucinex. No recent sick contacts. Denies hematochezia, melena. Denies fever, chills. She is trying to lose weight. Energy level is "strange" due to back injury. No worsening weakness/fatigue. She has also recently had a cough which started a month ago. She is planning an appointment with primary care. Denies chest pain, dyspnea, dizziness, lightheadedness, syncope, near syncope. Denies any other upper or lower GI symptoms.  Gastroparesis doing well, no ongoing N/V.  Past Medical History:  Diagnosis Date   Abnormal pulmonary function test    Anemia    H/H of 10/30 with a normal MCV in 12/09   Anxiety    Arthritis    Barrett's esophagus    Diagnosed 1995. Last EGD 2016-NO BARRETT'S.    Chest pain    Negative cardiac catheterization in 2002; negative stress nuclear study in 2008   Chronic anticoagulation    Chronic combined systolic and diastolic CHF (congestive heart failure) (HCC)    a. EF predominantly normal during prior echoes but was 40% during 10/2014 echo. b. Most recent 01/2015 EF normal, 55-60%.   Chronic LBP  Surgical intervention in 1996   Diabetes mellitus, type 2 (HCC)    Insulin therapy; exacerbated by prednisone   Dysrhythmia    AFib   Gastroparesis    99% retention 05/2008 on GES   GERD (gastroesophageal reflux disease)    Hiatal hernia    Hyperlipidemia    Hypertension    Hypothyroid    IBS (irritable bowel syndrome)    Obesity    OSA on CPAP    had CPAP and cannot tolerate.    Paroxysmal atrial fibrillation (HCC)    Pulmonary hypertension (HCC) 01/2015   a. Predominantly pulmonary venous hypertension but may be component of PAH.   Seizures (HCC)    last seizure was 2 years ago; on keppra for this; unknown etiology   Syncope    a. Admitted 05/2009; magnetic resonance imagin/ MRA - negative; etiology thought to be orthostasis secondary to drugs and dehydration. b. Syncope 02/2015 also felt 2/2 dehydration.    Past Surgical History:  Procedure Laterality Date   BACK SURGERY  1996   BIOPSY N/A 11/08/2013   Procedure: BIOPSY  / Tissue sampling / ulcers present in small intestine;  Surgeon: West BaliSandi L Fields, MD;  Location: [facility];  Service: Endoscopy;  Laterality: N/A;   CARDIAC CATHETERIZATION  2002   CARDIAC CATHETERIZATION N/A 01/26/2015   Procedure: Right Heart Cath;  Surgeon: Laurey Moralealton S McLean, MD;  [facility];  Service: Cardiovascular;  Laterality: N/A;   CARDIOVASCULAR STRESS TEST  2008   Stress nuclear study   CARDIOVERSION N/A 03/06/2015   Procedure: CARDIOVERSION;  Surgeon: Laurey Moralealton S McLean, MD;  [facility];  Service: Cardiovascular;  Laterality: N/A;   CARPAL TUNNEL RELEASE  1994   COLONOSCOPY  11/2011   Dr. Darrick Pennafields: Internal hemorrhoids, mild diverticulosis. Random colon biopsies negative.   COLONOSCOPY N/A 11/08/2013   SLF: Normal mucosa in the terminal ileum/The colon IS redundant/  Moderate diverticulosis throughout the entire colon. ileum bx benign. colon bx benign   ESOPHAGOGASTRODUODENOSCOPY  2008   Barrett's without dysplasia. esphagus dilated. antral erosions, h.pylori serologies negative.   ESOPHAGOGASTRODUODENOSCOPY  11/2011   Dr. Darrick PennaFields: Barrett's esophagus, mild gastritis, diverticulum in the second portion of the duodenum repeat EGD 3 years. Small bowel biopsies negative. Gastric biopsy show reactive gastropathy but no H. pylori. Esophageal biopsies consistent with GERD. Next EGD 11/2014    ESOPHAGOGASTRODUODENOSCOPY N/A 11/21/2014   ZOX:WRUESLF:mild non-erosive gastritis/irregular z-line   GIVENS CAPSULE STUDY  12/07/2011   Proximal small bowel, rare AVM. Distal small bowel, multiple ulcers noted   GIVENS CAPSULE STUDY N/A 09/27/2013   Distal small bowel ulcers extending to TI.   GIVENS CAPSULE STUDY N/A 10/10/2013   Procedure: GIVENS CAPSULE STUDY;  Surgeon: West BaliSandi L Fields, MD;  Location: [facility];  Service: Endoscopy;  Laterality: N/A;  7:30   IR KYPHO THORACIC WITH BONE BIOPSY  02/09/2018   KNEE ARTHROSCOPY WITH MEDIAL MENISECTOMY Right 06/09/2016   Procedure: KNEE ARTHROSCOPY WITH MEDIAL MENISECTOMY;  Surgeon: Vickki HearingStanley E Harrison, MD;  Location: [facility] ORS;  Service: Orthopedics;  Laterality: Right;  medial and lateral menisectomy   LAMINECTOMY  1995   L4-L5   LAPAROSCOPIC CHOLECYSTECTOMY  1990s   LEFT HEART CATHETERIZATION WITH CORONARY ANGIOGRAM  01/10/2014   Procedure: LEFT HEART CATHETERIZATION WITH CORONARY ANGIOGRAM;  Surgeon: Lesleigh NoeHenry W Smith III, MD;  [facility];  Service: Cardiovascular;;   RIGHT HEART CATHETERIZATION N/A 01/10/2014   Procedure: RIGHT HEART CATH;  Surgeon: Lyn RecordsHenry W Smith III,  MD;  Location: [facility] CATH [facility];  Service: Cardiovascular;  Laterality: N/A;   TOTAL ABDOMINAL HYSTERECTOMY  1999   TRACHEOSTOMY TUBE PLACEMENT N/A 09/01/2017   Procedure: TRACHEOSTOMY;  Surgeon: Drema Halon, MD;  [facility];  Service: ENT;  Laterality: N/A;    Current Outpatient Medications  Medication Sig Dispense Refill   acetaminophen (TYLENOL) 500 MG tablet Take 500 mg by mouth every 6 (six) hours as needed for moderate pain or headache.     albuterol (PROVENTIL) (2.5 MG/3ML) 0.083% nebulizer solution Take 3 mLs (2.5 mg total) by nebulization every 6 (six) hours as needed for wheezing or shortness of breath. 50 vial 1   amiodarone (PACERONE) 200 MG tablet Take 1 tablet (200 mg total) by mouth every morning. 30 tablet 11   apixaban (ELIQUIS) 5 MG TABS tablet  Take 1 tablet (5 mg total) by mouth 2 (two) times daily. 30 tablet 11   Cholecalciferol (VITAMIN D3) 2000 units capsule Take 2,000 Units by mouth daily.  11   dicyclomine (BENTYL) 10 MG capsule Take 2 capsules (20 mg total) by mouth 2 (two) times daily. 360 capsule 3   DULoxetine (CYMBALTA) 30 MG capsule Take 30 mg by mouth 2 (two) times daily.  0   gabapentin (NEURONTIN) 400 MG capsule Take 400-800 mg by mouth See admin instructions. Take 1 capsule(400mg ) by mouth twice a day and take 2 capsules(800mg ) by mouth at bedtime  11   glipiZIDE (GLUCOTROL) 10 MG tablet Take 10 mg by mouth 2 (two) times daily.  11   HYDROcodone-acetaminophen (NORCO) 10-325 MG tablet Take 1 tablet by mouth 4 (four) times daily.     insulin glargine (LANTUS) 100 UNIT/ML injection Inject 0.55 mLs (55 Units total) into the skin at bedtime. 10 mL 0   levETIRAcetam (KEPPRA) 1000 MG tablet Take 1,500 mg by mouth 2 (two) times daily.   1   levothyroxine (SYNTHROID) 150 MCG tablet Take 150 mcg by mouth daily.      loperamide (IMODIUM) 2 MG capsule TAKE ONE CAPSULE BY MOUTH TWICE DAILY AS NEEDED 60 capsule 3   LORazepam (ATIVAN) 1 MG tablet Take 1 mg by mouth 3 (three) times daily as needed. for anxiety  5   magnesium oxide (MAG-OX) 400 MG tablet Take 1 tablet (400 mg total) by mouth 2 (two) times daily. 180 tablet 1   meclizine (ANTIVERT) 25 MG tablet Take 25 mg by mouth as needed.      metFORMIN (GLUCOPHAGE) 500 MG tablet Take 500 mg by mouth 2 (two) times daily with a meal.     Multiple Vitamin (MULTIVITAMIN WITH MINERALS) TABS tablet Take 1 tablet by mouth daily.     naproxen sodium (ALEVE) 220 MG tablet Take 440 mg by mouth as needed (pain).      ondansetron (ZOFRAN) 4 MG tablet TAKE 1 TABLET BY MOUTH FOUR TIMES DAILY AS NEEDED FOR NAUSEA (Patient taking differently: Take 4 mg by mouth 4 (four) times daily as needed for nausea. ) 120 tablet 1   OXYGEN 2lpm with sleep and as needed during the day      pantoprazole (PROTONIX) 40 MG tablet TAKE ONE TABLET BY MOUTH TWICE DAILY BEFORE APPLY MEAL (Patient taking differently: Take 40 mg by mouth 2 (two) times daily. ) 180 tablet 3   potassium chloride (K-DUR) 10 MEQ tablet Take 2 tablets (20 mEq total) by mouth 2 (two) times daily. 120 tablet 11   torsemide (DEMADEX) 20 MG tablet TAKE TWO TABLETS BY MOUTH  EVERY MORNING AND TAKE 1 TABLET BY MOUTH EVERY EVENING (MAY TAKE EXTRA TABLET AS NEEDED FOR SWELLING) 90 tablet 11   insulin aspart (NOVOLOG) 100 UNIT/ML injection Inject 0-20 Units into the skin every 4 (four) hours. CBG 0-120--no units; 121-150--3 units; 151-200--4 units; 201-250--7units; 251-300--11 units; 301-350--15 units; 351-400--20 units (Patient not taking: Reported on 05/02/2019) 10 mL 0   No current facility-administered medications for this visit.     Allergies as of 05/02/2019 - Review Complete 05/02/2019  Allergen Reaction Noted   Celexa [citalopram hydrobromide] Other (See Comments) 06/30/2013   Codeine Nausea And Vomiting and Other (See Comments)    Dilaudid [hydromorphone hcl] Other (See Comments) 12/31/2015   Reglan [metoclopramide] Other (See Comments) 06/04/2014    Family History  Problem Relation Age of Onset   Hypertension Mother    Alzheimer's disease Mother    Stroke Mother    Heart attack Mother    Hypertension Other    Breast cancer Sister    Heart disease Neg Hx    Colon cancer Neg Hx     Social History   Socioeconomic History   Marital status: Married    Spouse name: Not on file   Number of children: Not on file   Years of education: Not on file   Highest education level: Not on file  Occupational History   Occupation: Museum/gallery curator at [employer]    Employer: [employer]: disabled    Fish farm manager: RETIRED  Social Designer, fashion/clothing strain: Not on file   Food insecurity    Worry: Not on file    Inability: Not on file   Transportation needs    Medical: Not on file     Non-medical: Not on file  Tobacco Use   Smoking status: Former Smoker    Packs/day: 0.25    Years: 15.00    Pack years: 3.75    Types: Cigarettes    Start date: 02/26/1966    Quit date: 07/01/1983    Years since quitting: 35.8   Smokeless tobacco: Never Used   Tobacco comment: quit in 1984  Substance and Sexual Activity   Alcohol use: No    Alcohol/week: 0.0 standard drinks   Drug use: No   Sexual activity: Yes    Birth control/protection: None, Surgical  Lifestyle   Physical activity    Days per week: Not on file    Minutes per session: Not on file   Stress: Not on file  Relationships   Social connections    Talks on phone: Not on file    Gets together: Not on file    Attends religious service: Not on file    Active member of club or organization: Not on file    Attends meetings of clubs or organizations: Not on file    Relationship status: Not on file  Other Topics Concern   Not on file  Social History Narrative   Sedentary   4 children, "blended family"    Review of Systems: General: Negative for anorexia, weight loss, fever, chills, fatigue, weakness. ENT: Negative for hoarseness, difficulty swallowing , nasal congestion. CV: Negative for chest pain, angina, palpitations, dyspnea on exertion, peripheral edema.  Respiratory: Negative for dyspnea at rest, dyspnea on exertion, cough, sputum, wheezing.  GI: See history of present illness. Endo: Negative for unusual weight change.  Heme: Negative for bruising or bleeding. Allergy: Negative for rash or hives.  Physical Exam: Note: limited exam due to virtual visit General:  Alert and oriented. Pleasant and cooperative. Ears:  Normal auditory acuity. Neurologic:  Alert and oriented x4 Psych:  Alert and cooperative. Normal mood and affect.

## 2019-05-02 NOTE — Progress Notes (Signed)
cc'ed to pcp °

## 2019-05-02 NOTE — Patient Instructions (Addendum)
Your health issues we discussed today were:   Anemia: 1. As we discussed I had like you to update your labs including your blood cell count and your iron levels 2. We will call you with results 3. Call us if you see any significant GI bleeding or have any symptoms of worsening anemia such as worsening weakness, fatigue, dizziness, chest pain, shortness of breath.  Worsening diarrhea: 1. As we discussed, pick up your stool kit from the labs and collect a stool sample 2. Your stool sample needs to be liquid or the lab will not run a test 3. We will call you with results 4. Depending on your results we will make further recommendations for how to help manage her diarrhea 5. Call us if you have any worsening or severe symptoms.  Overall I recommend:  1. Continue taking your current medications 2. Notify your primary care of sore throat, cough, worsening diarrhea 3. Return for follow-up in 6 weeks 4. Call us if you have any questions or concerns.   Because of recent events of COVID-19 ("Coronavirus"), follow CDC recommendations:  1. Wash your hand frequently 2. Avoid touching your face 3. Stay away from people who are sick 4. If you have symptoms such as fever, cough, shortness of breath then call your healthcare provider for further guidance 5. If you are sick, STAY AT HOME unless otherwise directed by your healthcare provider. 6. Follow directions from state and national officials regarding staying safe   At [facility] we value your feedback. You may receive a survey about your visit today. Please share your experience as we strive to create trusting relationships with our patients to provide genuine, compassionate, quality care.  We appreciate your understanding and patience as we review any laboratory studies, imaging, and other diagnostic tests that are ordered as we care for you. Our office policy is 5 business days for review of these results, and any emergent or  urgent results are addressed in a timely manner for your best interest. If you do not hear from our office in 1 week, please contact us.   We also encourage the use of MyChart, which contains your medical information for your review as well. If you are not enrolled in this feature, an access code is on this after visit summary for your convenience. Thank you for allowing [place] to be involved in your care.  It was great to see you today!  I hope you have a great Fall and Happy Halloween!!

## 2019-05-06 DIAGNOSIS — R269 Unspecified abnormalities of gait and mobility: Secondary | ICD-10-CM | POA: Diagnosis not present

## 2019-05-06 DIAGNOSIS — I5032 Chronic diastolic (congestive) heart failure: Secondary | ICD-10-CM | POA: Diagnosis not present

## 2019-05-08 DIAGNOSIS — E039 Hypothyroidism, unspecified: Secondary | ICD-10-CM | POA: Diagnosis not present

## 2019-05-08 DIAGNOSIS — M25561 Pain in right knee: Secondary | ICD-10-CM | POA: Diagnosis not present

## 2019-05-08 DIAGNOSIS — R2689 Other abnormalities of gait and mobility: Secondary | ICD-10-CM | POA: Diagnosis not present

## 2019-05-08 DIAGNOSIS — K3184 Gastroparesis: Secondary | ICD-10-CM | POA: Diagnosis not present

## 2019-05-08 DIAGNOSIS — E1165 Type 2 diabetes mellitus with hyperglycemia: Secondary | ICD-10-CM | POA: Diagnosis not present

## 2019-05-08 DIAGNOSIS — G3184 Mild cognitive impairment, so stated: Secondary | ICD-10-CM | POA: Diagnosis not present

## 2019-05-08 DIAGNOSIS — K529 Noninfective gastroenteritis and colitis, unspecified: Secondary | ICD-10-CM | POA: Diagnosis not present

## 2019-05-08 DIAGNOSIS — E119 Type 2 diabetes mellitus without complications: Secondary | ICD-10-CM | POA: Diagnosis not present

## 2019-05-08 DIAGNOSIS — M797 Fibromyalgia: Secondary | ICD-10-CM | POA: Diagnosis not present

## 2019-05-08 DIAGNOSIS — E1142 Type 2 diabetes mellitus with diabetic polyneuropathy: Secondary | ICD-10-CM | POA: Diagnosis not present

## 2019-05-08 DIAGNOSIS — D649 Anemia, unspecified: Secondary | ICD-10-CM | POA: Diagnosis not present

## 2019-05-08 LAB — FERRITIN: Ferritin: 131 ng/mL (ref 16–288)

## 2019-05-14 DIAGNOSIS — E1142 Type 2 diabetes mellitus with diabetic polyneuropathy: Secondary | ICD-10-CM | POA: Diagnosis not present

## 2019-05-14 DIAGNOSIS — Z7901 Long term (current) use of anticoagulants: Secondary | ICD-10-CM | POA: Diagnosis not present

## 2019-05-14 DIAGNOSIS — F419 Anxiety disorder, unspecified: Secondary | ICD-10-CM | POA: Diagnosis not present

## 2019-05-14 DIAGNOSIS — I480 Paroxysmal atrial fibrillation: Secondary | ICD-10-CM | POA: Diagnosis not present

## 2019-05-21 DIAGNOSIS — E039 Hypothyroidism, unspecified: Secondary | ICD-10-CM | POA: Diagnosis not present

## 2019-05-21 DIAGNOSIS — I10 Essential (primary) hypertension: Secondary | ICD-10-CM | POA: Diagnosis not present

## 2019-05-21 DIAGNOSIS — E7800 Pure hypercholesterolemia, unspecified: Secondary | ICD-10-CM | POA: Diagnosis not present

## 2019-05-21 DIAGNOSIS — E1142 Type 2 diabetes mellitus with diabetic polyneuropathy: Secondary | ICD-10-CM | POA: Diagnosis not present

## 2019-05-21 DIAGNOSIS — I509 Heart failure, unspecified: Secondary | ICD-10-CM | POA: Diagnosis not present

## 2019-05-21 DIAGNOSIS — E782 Mixed hyperlipidemia: Secondary | ICD-10-CM | POA: Diagnosis not present

## 2019-05-23 DIAGNOSIS — K3184 Gastroparesis: Secondary | ICD-10-CM | POA: Diagnosis not present

## 2019-05-23 DIAGNOSIS — D649 Anemia, unspecified: Secondary | ICD-10-CM | POA: Diagnosis not present

## 2019-05-23 DIAGNOSIS — K529 Noninfective gastroenteritis and colitis, unspecified: Secondary | ICD-10-CM | POA: Diagnosis not present

## 2019-05-27 LAB — GASTROINTESTINAL PATHOGEN PANEL PCR
C. difficile Tox A/B, PCR: NOT DETECTED
Campylobacter, PCR: NOT DETECTED
Cryptosporidium, PCR: NOT DETECTED
E coli (ETEC) LT/ST PCR: NOT DETECTED
E coli (STEC) stx1/stx2, PCR: NOT DETECTED
E coli 0157, PCR: NOT DETECTED
Giardia lamblia, PCR: NOT DETECTED
Norovirus, PCR: NOT DETECTED
Rotavirus A, PCR: NOT DETECTED
Salmonella, PCR: NOT DETECTED
Shigella, PCR: NOT DETECTED

## 2019-05-27 LAB — C. DIFFICILE GDH AND TOXIN A/B
GDH ANTIGEN: NOT DETECTED
MICRO NUMBER:: 1120723
SPECIMEN QUALITY:: ADEQUATE
TOXIN A AND B: NOT DETECTED

## 2019-06-05 DIAGNOSIS — R269 Unspecified abnormalities of gait and mobility: Secondary | ICD-10-CM | POA: Diagnosis not present

## 2019-06-05 DIAGNOSIS — I5032 Chronic diastolic (congestive) heart failure: Secondary | ICD-10-CM | POA: Diagnosis not present

## 2019-06-07 DIAGNOSIS — J209 Acute bronchitis, unspecified: Secondary | ICD-10-CM | POA: Diagnosis not present

## 2019-06-08 IMAGING — US US EXTREM LOW VENOUS*R*
1 series · 13 of 24 positions shown · non-contrast
Comparison: Right lower extremity venous Doppler
ultrasound-12/22/2013

CLINICAL DATA: Intermittent right lower extremity pain and edema
for the past 3 months. Former smoker. Evaluate for DVT.



[Series 1: us extrem low venous*right* · 0.08mm/px · 13 of 37 slices shown]
[im 1/37]
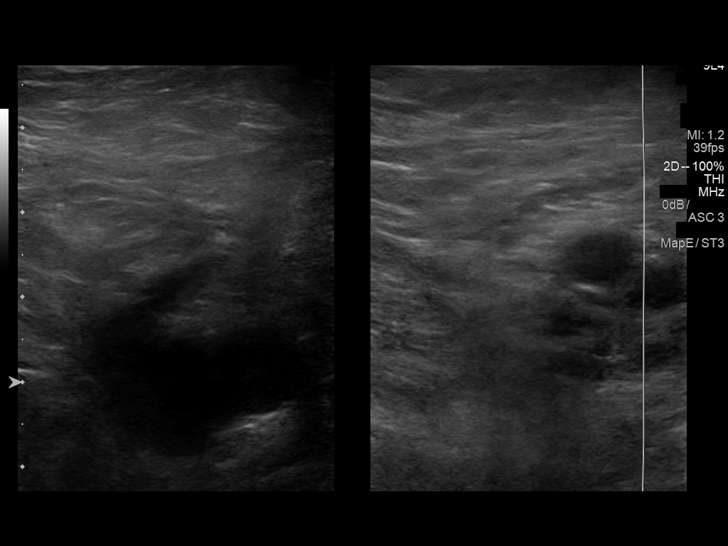
[im 4/37]
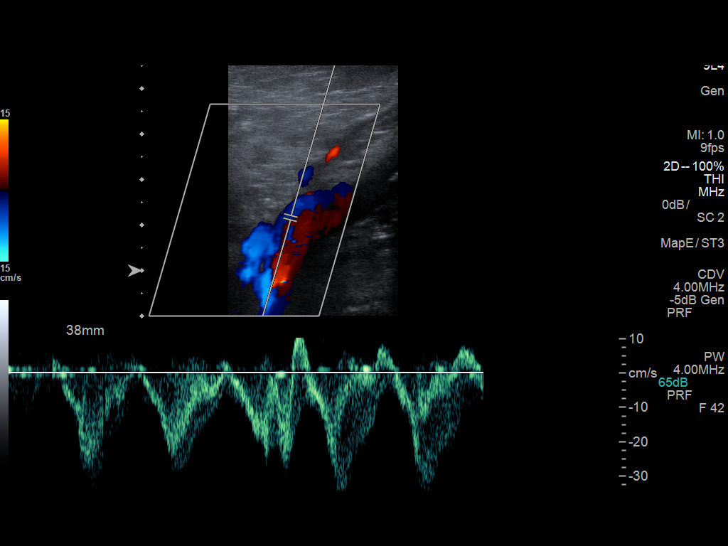
[im 7/37]
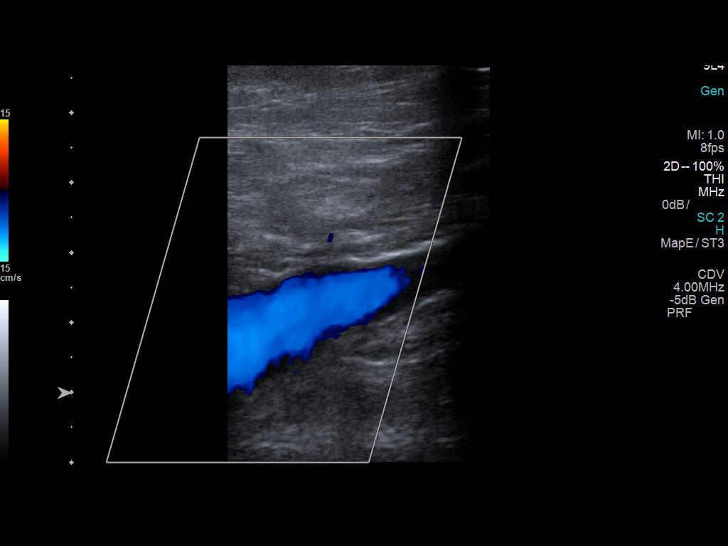
[im 10/37]
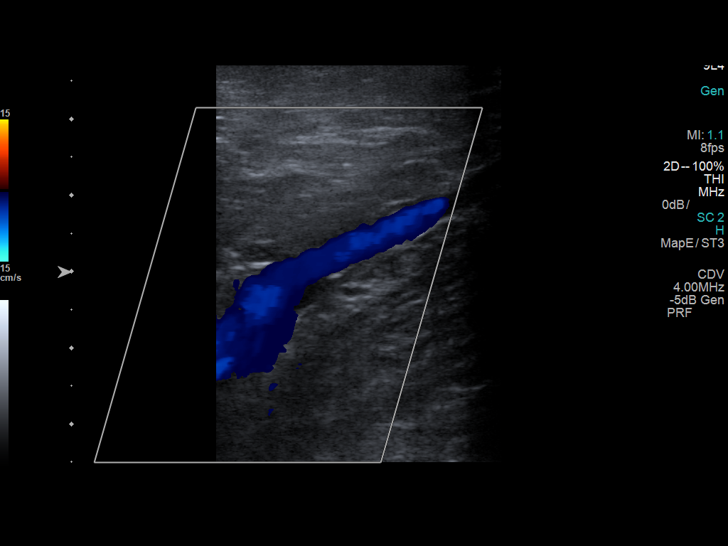
[im 13/37]
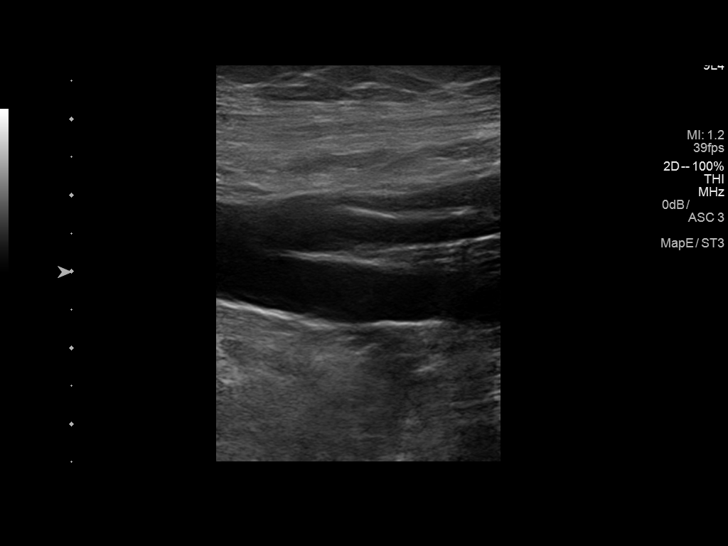
[im 16/37]
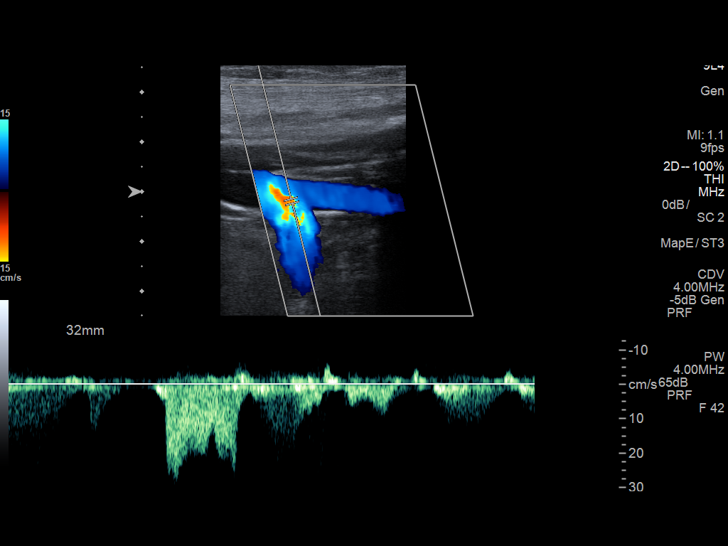
[im 19/37]
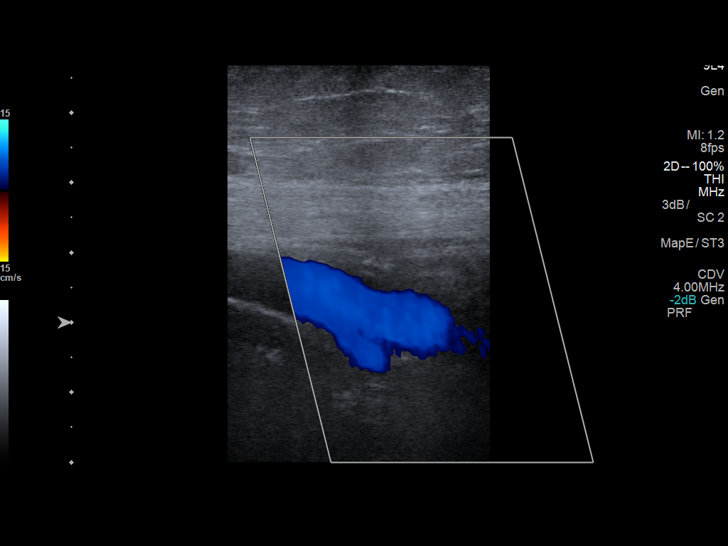
[im 21/37]
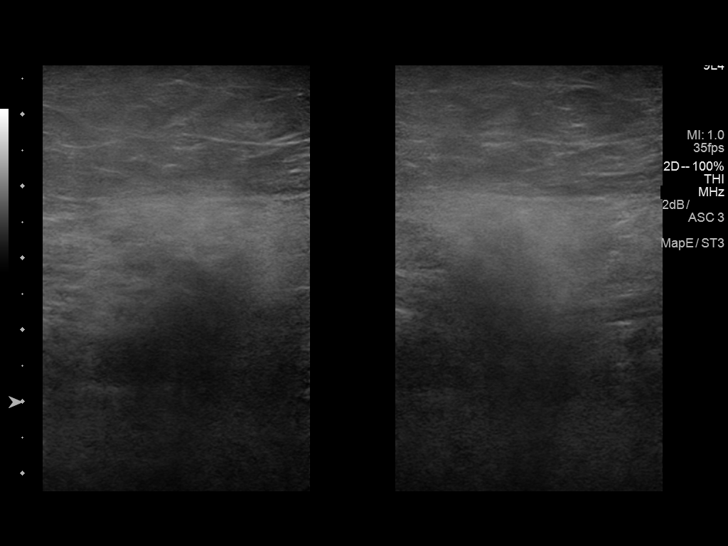
[im 24/37]
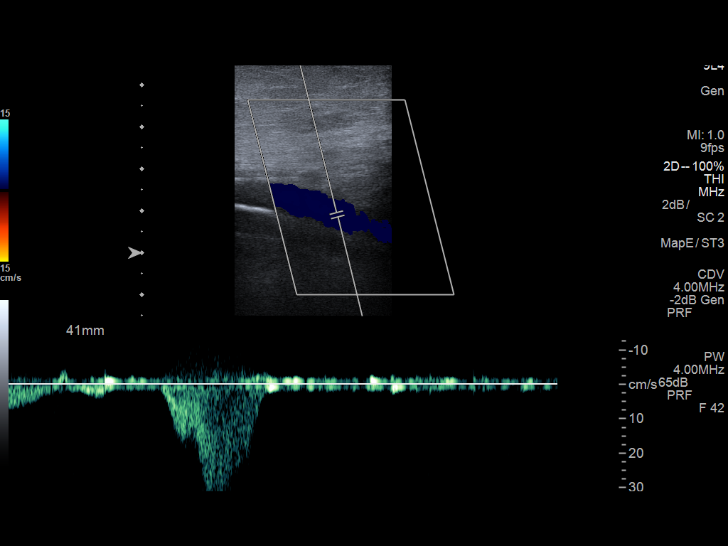
[im 27/37]
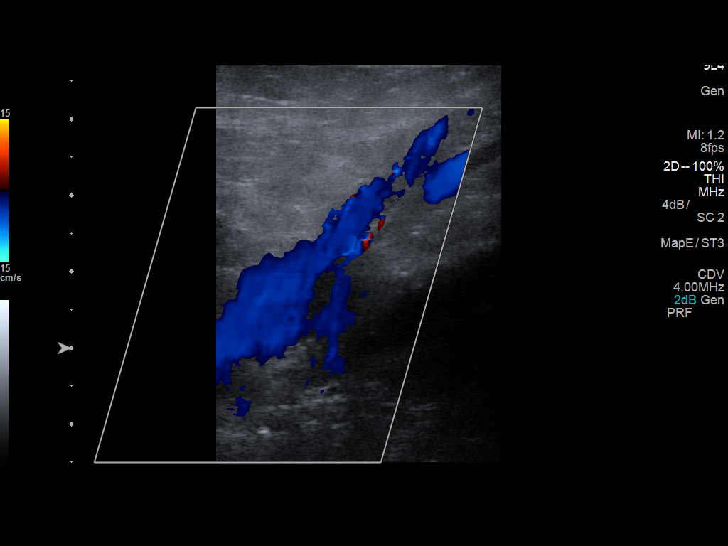
[im 30/37]
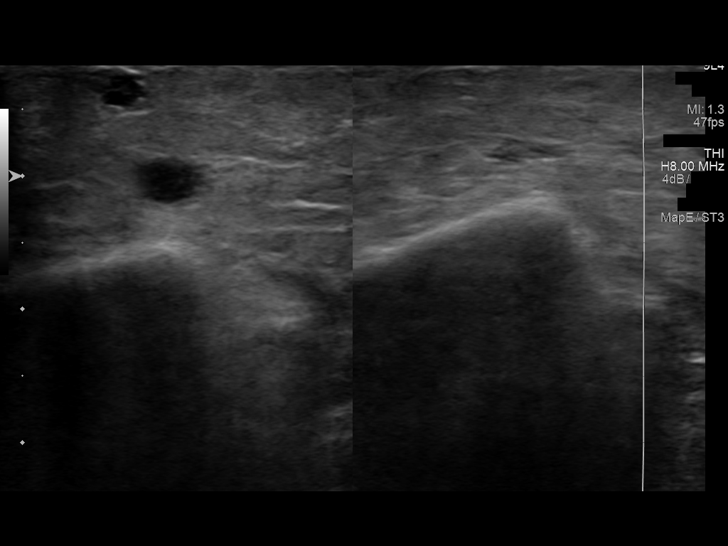
[im 33/37]
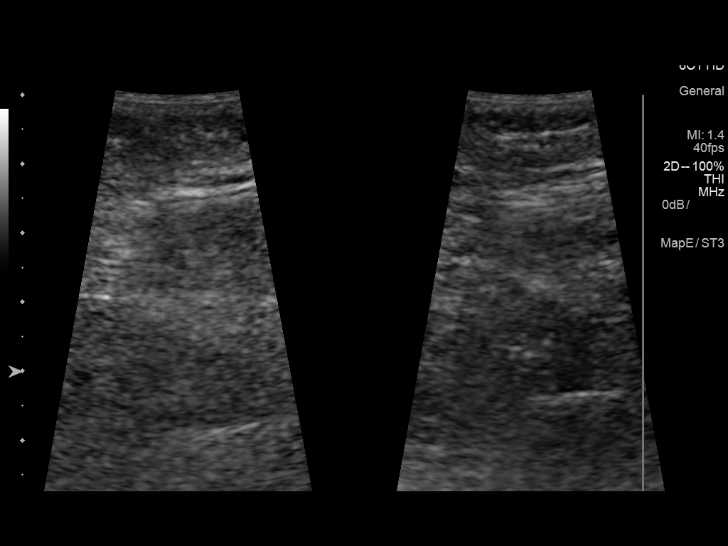
[im 37/37]
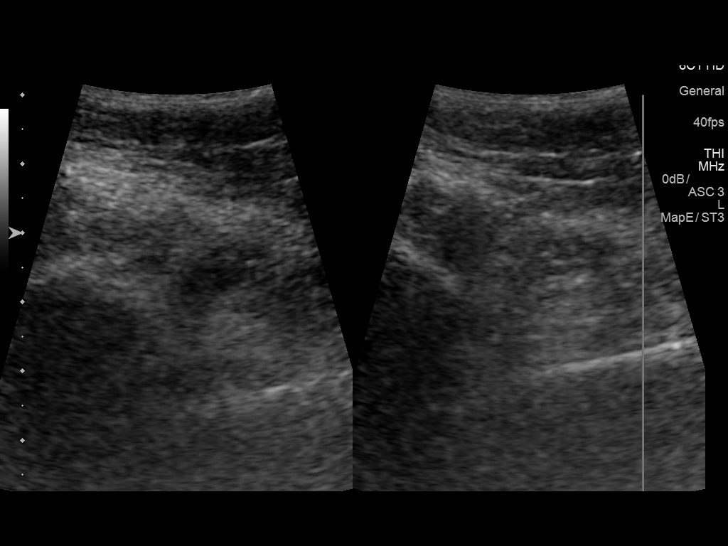

[13 of 24 positions shown; findings below may reference images not displayed]

FINDINGS: Contralateral Common Femoral Vein: Respiratory phasicity is normal
and symmetric with the symptomatic side. No evidence of thrombus.
Normal compressibility.

Common Femoral Vein: No evidence of thrombus. Normal
compressibility, respiratory phasicity and response to augmentation.

Saphenofemoral Junction: No evidence of thrombus. Normal
compressibility and flow on color Doppler imaging.

Profunda Femoral Vein: No evidence of thrombus. Normal
compressibility and flow on color Doppler imaging.

Femoral Vein: No evidence of thrombus. Normal compressibility,
respiratory phasicity and response to augmentation.

Popliteal Vein: No evidence of thrombus. Normal compressibility,
respiratory phasicity and response to augmentation.

Calf Veins: No evidence of thrombus. Normal compressibility and flow
on color Doppler imaging.

Superficial Great Saphenous Vein: No evidence of thrombus. Normal
compressibility.

Venous Reflux:  None.

Other Findings:  None.
IMPRESSION: No evidence of DVT within the right lower extremity.

## 2019-06-09 ENCOUNTER — Other Ambulatory Visit: Payer: Self-pay | Admitting: Nurse Practitioner

## 2019-06-12 ENCOUNTER — Telehealth: Payer: Self-pay | Admitting: Gastroenterology

## 2019-06-12 NOTE — Telephone Encounter (Signed)
LMOM to call.

## 2019-06-12 NOTE — Telephone Encounter (Signed)
[name] said her mom is not having significant diarrhea now and does not need anything for it. They will let us know if she does.

## 2019-06-12 NOTE — Telephone Encounter (Signed)
[name] was returning a call from DS. [phone]

## 2019-06-18 ENCOUNTER — Other Ambulatory Visit: Payer: PPO

## 2019-06-19 ENCOUNTER — Ambulatory Visit: Payer: PPO | Admitting: Nurse Practitioner

## 2019-06-26 ENCOUNTER — Other Ambulatory Visit: Payer: PPO

## 2019-07-04 ENCOUNTER — Encounter: Payer: Self-pay | Admitting: Nurse Practitioner

## 2019-07-04 ENCOUNTER — Other Ambulatory Visit: Payer: Self-pay

## 2019-07-04 ENCOUNTER — Ambulatory Visit (INDEPENDENT_AMBULATORY_CARE_PROVIDER_SITE_OTHER): Payer: PPO | Admitting: Nurse Practitioner

## 2019-07-04 DIAGNOSIS — K580 Irritable bowel syndrome with diarrhea: Secondary | ICD-10-CM

## 2019-07-04 MED ORDER — HYOSCYAMINE SULFATE 0.125 MG SL SUBL: 0.1250 mg | SUBLINGUAL_TABLET | SUBLINGUAL | 3 refills | Status: DC | PRN

## 2019-07-04 NOTE — Assessment & Plan Note (Signed)
Chronic history of IBS diarrhea type resulting in chronic diarrhea.  She was doing somewhat better over the past few months.  However, about 2 weeks ago she had a fall and around that time she began having worsening diarrhea.  She is having Bristol 6-7 stools about 4 times a day.  She is trying Bentyl with meals as well as Imodium with meals and has had some minimal improvement but no significant improvement.  At this time we will have her stop Bentyl and start hyoscyamine 0.125 mg sublingual every 4 hours as needed.  She can continue Imodium as well.  Discussed possible risk for dry mouth and other adverse effects.  I have requested a progress report in 2 to 4 weeks.  Further recommendations depending on her clinical progression.  Return for follow-up in 2 months.  Call if any worsening or severe symptoms.  Given recent negative stool studies and no healthcare exposure recent antibiotics I doubt overt colonic infection.

## 2019-07-04 NOTE — Patient Instructions (Signed)
Your health issues we discussed today were:   Chronic diarrhea: 1. I am sorry your diarrhea has gotten worse!  I am also sorry that she had a fall a couple weeks ago, I hope you are doing better! 2. Stop taking Bentyl (dicyclomine) for now 3. I have sent a prescription for hyoscyamine 0.125 mg.  You can place 1 under your tongue to dissolve every 4 hours as needed for diarrhea 4. You can continue to use Imodium (loperamide) as needed to help with diarrhea as well 5. Call us in 2 to 4 weeks and let us know if this medication is helping her diarrhea any better 6. Call us if you have any worsening or severe symptoms  Overall I recommend:  1. Continue your other current medications 2. Return for follow-up in 2 months 3. Call us if you have any questions or concerns.   Because of recent events of COVID-19 ("Coronavirus"), follow CDC recommendations:  1. Wash your hand frequently 2. Avoid touching your face 3. Stay away from people who are sick 4. If you have symptoms such as fever, cough, shortness of breath then call your healthcare provider for further guidance 5. If you are sick, STAY AT HOME unless otherwise directed by your healthcare provider. 6. Follow directions from state and national officials regarding staying safe   At [facility] we value your feedback. You may receive a survey about your visit today. Please share your experience as we strive to create trusting relationships with our patients to provide genuine, compassionate, quality care.  We appreciate your understanding and patience as we review any laboratory studies, imaging, and other diagnostic tests that are ordered as we care for you. Our office policy is 5 business days for review of these results, and any emergent or urgent results are addressed in a timely manner for your best interest. If you do not hear from our office in 1 week, please contact us.   We also encourage the use of MyChart, which  contains your medical information for your review as well. If you are not enrolled in this feature, an access code is on this after visit summary for your convenience. Thank you for allowing [place] to be involved in your care.  It was great to see you today!  I hope you have a Happy New Year!!

## 2019-07-04 NOTE — Progress Notes (Signed)
Referring Provider: Gareth Morgan, MD Primary Care Physician:  Gareth Morgan, MD Primary GI:  Dr. Darrick Penna  NOTE: Service was provided via telemedicine and was requested by the patient due to COVID-19 pandemic.  Method of visit: Telephone  Patient Location: Home  Provider Location: Office  Reason for Phone Visit: Follow-up  The patient was consented to phone follow-up via telephone encounter including billing of the encounter (yes/no): Yes  Persons present on the phone encounter, with roles: Daughter  Total time (minutes) spent on medical discussion: 22 minutes  Chief Complaint  Patient presents with  . Diarrhea    daily past 2 weeks    HPI:   [name] is a 68 y.o. female who presents for virtual visit regarding: Follow-up on IDA and diarrhea.  The patient was last seen in our office 05/02/2019 for anemia, chronic diarrhea, gastroparesis.  The patient's last visit was a virtual office visit due to COVID-19/coronavirus pandemic.  Noted chronic history of abdominal pain, gastroparesis, IBS-D, IDA.  Previous BPE with small to moderate sized sliding-type hiatal hernia with spontaneous and inducible reflux, nonspecific esophageal motility.  Capsule study in April 2015 with idiopathic small bowel ulcers present since 2013.  EGD in 2016 without evidence of Barrett's.  Otherwise normal.  Last colonoscopy in May 2015.  She typically is well managed on Bentyl twice daily related to her IBS-D.  Zofran 4 times a day controls her nausea.  Unable to tolerate oral iron in the past and followed by hematology.  At her last visit she has had worsening diarrhea over the past 2 weeks and increase Bentyl to 4 times a day which is not helping as well as Imodium twice daily also without improvement.  Having 3-4 stools a day consistently Bristol 5-6.  Denies any healthcare exposure or recent antibiotics.  No recent sick contacts.  No hematochezia or melena.  Energy level is "strange" due to her  back injury but no overt weakness or fatigue.  Upcoming appointment with primary care related to her cough.  No other overt GI symptoms.  Recommended updating labs including CBC and iron levels, stool studies, follow-up in 6 weeks.  Does not appear CBC was completed.  Ferritin was completed 05/08/2019 which was normal at 131 and significantly improved compared to your prior when it was 13.  Stool studies were also negative.  Recommended contact patient and offer other treatments for diarrhea.  A message was left on her phone and a letter mailed.  The patient's daughter did call back and stated her mom was not having significant diarrhea now and does not need anything for it but they will let us know if she does.  No further communication from the patient.  Today she states she's doing ok overall. She has had daily diarrhea for the past 2 weeks, typically going at least 5 times a day. Stools are uncontrollable and has had several episodes of incontinence and messes. Diarrhea is Bristol 6-7. Denies hematochezia, melena. Denies recent sick contacts. Has been self-quarantine at home (as precaution, to be safe and prevent catching COVID-19). She has been using bentyl 3-4 times a day, which has helped some. Has also been taking Imodium three times a day. Has not been taking anything else. Symptoms typically occur after eating and sometimes nocturnally. Has some lower abdominal cramping and sharp pain which improves after several bowel movements. Denies fever, chills, unintentional weight loss. She has had decreased energy over the past weeks and notes this started after  a fall about 2 weeks ago; this is also about the time she started having worsening diarrhea.   Eats rare dairy.   Past Medical History:  Diagnosis Date  . Abnormal pulmonary function test   . Anemia    H/H of 10/30 with a normal MCV in 12/09  . Anxiety   . Arthritis   . Barrett's esophagus    Diagnosed 1995. Last EGD 2016-NO BARRETT'S.     . Chest pain    Negative cardiac catheterization in 2002; negative stress nuclear study in 2008  . Chronic anticoagulation   . Chronic combined systolic and diastolic CHF (congestive heart failure) (HCC)    a. EF predominantly normal during prior echoes but was 40% during 10/2014 echo. b. Most recent 01/2015 EF normal, 55-60%.  . Chronic LBP    Surgical intervention in 1996  . Diabetes mellitus, type 2 (HCC)    Insulin therapy; exacerbated by prednisone  . Dysrhythmia    AFib  . Gastroparesis    99% retention 05/2008 on GES  . GERD (gastroesophageal reflux disease)   . Hiatal hernia   . Hyperlipidemia   . Hypertension   . Hypothyroid   . IBS (irritable bowel syndrome)   . Obesity   . OSA on CPAP    had CPAP and cannot tolerate.  . Paroxysmal atrial fibrillation (HCC)   . Pulmonary hypertension (HCC) 01/2015   a. Predominantly pulmonary venous hypertension but may be component of PAH.  . Seizures (HCC)    last seizure was 2 years ago; on keppra for this; unknown etiology  . Syncope    a. Admitted 05/2009; magnetic resonance imagin/ MRA - negative; etiology thought to be orthostasis secondary to drugs and dehydration. b. Syncope 02/2015 also felt 2/2 dehydration.    Past Surgical History:  Procedure Laterality Date  . BACK SURGERY  1996  . BIOPSY N/A 11/08/2013   Procedure: BIOPSY  / Tissue sampling / ulcers present in small intestine;  Surgeon: West Bali, MD;  Location: [facility];  Service: Endoscopy;  Laterality: N/A;  . CARDIAC CATHETERIZATION  2002  . CARDIAC CATHETERIZATION N/A 01/26/2015   Procedure: Right Heart Cath;  Surgeon: Laurey Morale, MD;  Location: [facility] INVASIVE CV LAB;  Service: Cardiovascular;  Laterality: N/A;  . CARDIOVASCULAR STRESS TEST  2008   Stress nuclear study  . CARDIOVERSION N/A 03/06/2015   Procedure: CARDIOVERSION;  Surgeon: Laurey Morale, MD;  Location: [facility] ENDOSCOPY;  Service: Cardiovascular;  Laterality: N/A;  . CARPAL TUNNEL RELEASE  1994   . COLONOSCOPY  11/2011   Dr. Darrick Penna: Internal hemorrhoids, mild diverticulosis. Random colon biopsies negative.  . COLONOSCOPY N/A 11/08/2013   SLF: Normal mucosa in the terminal ileum/The colon IS redundant/  Moderate diverticulosis throughout the entire colon. ileum bx benign. colon bx benign  . ESOPHAGOGASTRODUODENOSCOPY  2008   Barrett's without dysplasia. esphagus dilated. antral erosions, h.pylori serologies negative.  . ESOPHAGOGASTRODUODENOSCOPY  11/2011   Dr. Darrick Penna: Barrett's esophagus, mild gastritis, diverticulum in the second portion of the duodenum repeat EGD 3 years. Small bowel biopsies negative. Gastric biopsy show reactive gastropathy but no H. pylori. Esophageal biopsies consistent with GERD. Next EGD 11/2014  . ESOPHAGOGASTRODUODENOSCOPY N/A 11/21/2014   PIR:JJOA non-erosive gastritis/irregular z-line  . GIVENS CAPSULE STUDY  12/07/2011   Proximal small bowel, rare AVM. Distal small bowel, multiple ulcers noted  . GIVENS CAPSULE STUDY N/A 09/27/2013   Distal small bowel ulcers extending to TI.  Marland Kitchen GIVENS CAPSULE STUDY N/A 10/10/2013  Procedure: GIVENS CAPSULE STUDY;  Surgeon: West BaliSandi L Fields, MD;  Location: [facility];  Service: Endoscopy;  Laterality: N/A;  7:30  . IR KYPHO THORACIC WITH BONE BIOPSY  02/09/2018  . KNEE ARTHROSCOPY WITH MEDIAL MENISECTOMY Right 06/09/2016   Procedure: KNEE ARTHROSCOPY WITH MEDIAL MENISECTOMY;  Surgeon: Vickki HearingStanley E Harrison, MD;  Location: [facility] ORS;  Service: Orthopedics;  Laterality: Right;  medial and lateral menisectomy  . LAMINECTOMY  1995   L4-L5  . LAPAROSCOPIC CHOLECYSTECTOMY  1990s  . LEFT HEART CATHETERIZATION WITH CORONARY ANGIOGRAM  01/10/2014   Procedure: LEFT HEART CATHETERIZATION WITH CORONARY ANGIOGRAM;  Surgeon: Lesleigh NoeHenry W Smith III, MD;  [facility];  Service: Cardiovascular;;  . RIGHT HEART CATHETERIZATION N/A 01/10/2014   Procedure: RIGHT HEART CATH;  Surgeon: Lesleigh NoeHenry W Smith III, MD;  [facility];  Service:  Cardiovascular;  Laterality: N/A;  . TOTAL ABDOMINAL HYSTERECTOMY  1999  . TRACHEOSTOMY TUBE PLACEMENT N/A 09/01/2017   Procedure: TRACHEOSTOMY;  Surgeon: Drema HalonNewman, Christopher E, MD;  [facility];  Service: ENT;  Laterality: N/A;    Current Outpatient Medications  Medication Sig Dispense Refill  . acetaminophen (TYLENOL) 500 MG tablet Take 500 mg by mouth every 6 (six) hours as needed for moderate pain or headache.    . albuterol (PROVENTIL) (2.5 MG/3ML) 0.083% nebulizer solution Take 3 mLs (2.5 mg total) by nebulization every 6 (six) hours as needed for wheezing or shortness of breath. 50 vial 1  . amiodarone (PACERONE) 200 MG tablet Take 1 tablet (200 mg total) by mouth every morning. 30 tablet 11  . apixaban (ELIQUIS) 5 MG TABS tablet Take 1 tablet (5 mg total) by mouth 2 (two) times daily. 30 tablet 11  . Cholecalciferol (VITAMIN D3) 2000 units capsule Take 2,000 Units by mouth daily.  11  . dicyclomine (BENTYL) 10 MG capsule Take 2 capsules (20 mg total) by mouth 2 (two) times daily. 360 capsule 3  . DULoxetine (CYMBALTA) 30 MG capsule Take 30 mg by mouth 2 (two) times daily.  0  . gabapentin (NEURONTIN) 400 MG capsule Take 400-800 mg by mouth See admin instructions. Take 1 capsule(400mg ) by mouth twice a day and take 2 capsules(800mg ) by mouth at bedtime  11  . glipiZIDE (GLUCOTROL) 10 MG tablet Take 10 mg by mouth 2 (two) times daily.  11  . HYDROcodone-acetaminophen (NORCO) 10-325 MG tablet Take 1 tablet by mouth as needed.     . insulin glargine (LANTUS) 100 UNIT/ML injection Inject 0.55 mLs (55 Units total) into the skin at bedtime. 10 mL 0  . levETIRAcetam (KEPPRA) 1000 MG tablet Take 1,500 mg by mouth 2 (two) times daily.   1  . levothyroxine (SYNTHROID) 175 MCG tablet Take 1 tablet by mouth daily.    Marland Kitchen. loperamide (IMODIUM) 2 MG capsule TAKE ONE CAPSULE BY MOUTH TWICE DAILY AS NEEDED 60 capsule 2  . LORazepam (ATIVAN) 1 MG tablet Take 1 mg by mouth 3 (three) times daily as needed.  for anxiety  5  . magnesium oxide (MAG-OX) 400 MG tablet Take 1 tablet (400 mg total) by mouth 2 (two) times daily. 180 tablet 1  . meclizine (ANTIVERT) 25 MG tablet Take 25 mg by mouth as needed.     . metFORMIN (GLUCOPHAGE) 500 MG tablet Take 500 mg by mouth 2 (two) times daily with a meal.    . Multiple Vitamin (MULTIVITAMIN WITH MINERALS) TABS tablet Take 1 tablet by mouth daily.    . ondansetron (ZOFRAN)  4 MG tablet TAKE 1 TABLET BY MOUTH FOUR TIMES DAILY AS NEEDED FOR NAUSEA (Patient taking differently: Take 4 mg by mouth 4 (four) times daily as needed for nausea. ) 120 tablet 1  . OXYGEN 2lpm with sleep and as needed during the day    . potassium chloride (K-DUR) 10 MEQ tablet Take 2 tablets (20 mEq total) by mouth 2 (two) times daily. (Patient taking differently: Take 20 mEq by mouth daily. ) 120 tablet 11  . torsemide (DEMADEX) 20 MG tablet TAKE TWO TABLETS BY MOUTH EVERY MORNING AND TAKE 1 TABLET BY MOUTH EVERY EVENING (MAY TAKE EXTRA TABLET AS NEEDED FOR SWELLING) 90 tablet 11  . levothyroxine (SYNTHROID) 150 MCG tablet Take 150 mcg by mouth daily.     . naproxen sodium (ALEVE) 220 MG tablet Take 440 mg by mouth as needed (pain).     . pantoprazole (PROTONIX) 40 MG tablet TAKE ONE TABLET BY MOUTH TWICE DAILY BEFORE APPLY MEAL (Patient not taking: No sig reported) 180 tablet 3   No current facility-administered medications for this visit.    Allergies as of 07/04/2019 - Review Complete 07/04/2019  Allergen Reaction Noted  . Celexa [citalopram hydrobromide] Other (See Comments) 06/30/2013  . Codeine Nausea And Vomiting and Other (See Comments)   . Dilaudid [hydromorphone hcl] Other (See Comments) 12/31/2015  . Reglan [metoclopramide] Other (See Comments) 06/04/2014    Family History  Problem Relation Age of Onset  . Hypertension Mother   . Alzheimer's disease Mother   . Stroke Mother   . Heart attack Mother   . Hypertension Other   . Breast cancer Sister   . Heart disease Neg  Hx   . Colon cancer Neg Hx     Social History   Socioeconomic History  . Marital status: Married    Spouse name: Not on file  . Number of children: Not on file  . Years of education: Not on file  . Highest education level: Not on file  Occupational History  . 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 Smoking status: Former Smoker    Packs/day: 0.25    Years: 15.00    Pack years: 3.75    Types: Cigarettes    Start date: 02/26/1966    Quit date: 07/01/1983    Years since quitting: 36.0  . Smokeless tobacco: Never Used  . Tobacco comment: quit in 1984  Substance and Sexual Activity  . Alcohol use: No    Alcohol/week: 0.0 standard drinks  . Drug use: No  . Sexual activity: Yes    Birth control/protection: None, Surgical  Other Topics Concern  . Not on file  Social History Narrative   Sedentary   4 children, "blended family"   Social Determinants of Health   Financial Resource Strain:   . Difficulty of Paying Living Expenses: Not on file  Food Insecurity:   . Worried About Charity fundraiser in the Last Year: Not on file  . Ran Out of Food in the Last Year: Not on file  Transportation Needs:   . Lack of Transportation (Medical): Not on file  . Lack of Transportation (Non-Medical): Not on file  Physical Activity:   . Days of Exercise per Week: Not on file  . Minutes of Exercise per Session: Not on file  Stress:   . Feeling of Stress : Not on file  Social Connections:   .  Frequency of Communication with Friends and Family: Not on file  . Frequency of Social Gatherings with Friends and Family: Not on file  . Attends Religious Services: Not on file  . Active Member of Clubs or Organizations: Not on file  . Attends BankerClub or Organization Meetings: Not on file  . Marital Status: Not on file    Review of Systems: General: Negative for anorexia, weight loss, fever, chills, fatigue, weakness. Eyes:  Negative for vision changes.  ENT: Negative for hoarseness, difficulty swallowing , nasal congestion. CV: Negative for chest pain, angina, palpitations, dyspnea on exertion, peripheral edema.  Respiratory: Negative for dyspnea at rest, dyspnea on exertion, cough, sputum, wheezing.  GI: See history of present illness. GU:  Negative for dysuria, hematuria, urinary incontinence, urinary frequency, nocturnal urination.  MS: Negative for joint pain, low back pain.  Derm: Negative for rash or itching.  Neuro: Negative for weakness, abnormal sensation, seizure, frequent headaches, memory loss, confusion.  Psych: Negative for anxiety, depression, suicidal ideation, hallucinations.  Endo: Negative for unusual weight change.  Heme: Negative for bruising or bleeding. Allergy: Negative for rash or hives.  Physical Exam: Note: limited exam due to virtual visit General:   Alert and oriented. Pleasant and cooperative. Well-nourished and well-developed.  Head:  Normocephalic and atraumatic. Eyes:  Without icterus, sclera clear and conjunctiva pink.  Ears:  Normal auditory acuity. Skin:  Intact without facial significant lesions or rashes. Neurologic:  Alert and oriented x4;  grossly normal neurologically. Psych:  Alert and cooperative. Normal mood and affect. Heme/Lymph/Immune: No excessive bruising noted.

## 2019-07-06 DIAGNOSIS — I5032 Chronic diastolic (congestive) heart failure: Secondary | ICD-10-CM | POA: Diagnosis not present

## 2019-07-06 DIAGNOSIS — R269 Unspecified abnormalities of gait and mobility: Secondary | ICD-10-CM | POA: Diagnosis not present

## 2019-07-08 ENCOUNTER — Encounter: Payer: Self-pay | Admitting: Gastroenterology

## 2019-07-12 DIAGNOSIS — R569 Unspecified convulsions: Secondary | ICD-10-CM | POA: Diagnosis not present

## 2019-07-13 DIAGNOSIS — R569 Unspecified convulsions: Secondary | ICD-10-CM | POA: Diagnosis not present

## 2019-07-14 DIAGNOSIS — R569 Unspecified convulsions: Secondary | ICD-10-CM | POA: Diagnosis not present

## 2019-07-15 DIAGNOSIS — R569 Unspecified convulsions: Secondary | ICD-10-CM | POA: Diagnosis not present

## 2019-07-16 DIAGNOSIS — R569 Unspecified convulsions: Secondary | ICD-10-CM | POA: Diagnosis not present

## 2019-07-31 DIAGNOSIS — J209 Acute bronchitis, unspecified: Secondary | ICD-10-CM | POA: Diagnosis not present

## 2019-08-06 DIAGNOSIS — M25561 Pain in right knee: Secondary | ICD-10-CM | POA: Diagnosis not present

## 2019-08-06 DIAGNOSIS — I5032 Chronic diastolic (congestive) heart failure: Secondary | ICD-10-CM | POA: Diagnosis not present

## 2019-08-06 DIAGNOSIS — G3184 Mild cognitive impairment, so stated: Secondary | ICD-10-CM | POA: Diagnosis not present

## 2019-08-06 DIAGNOSIS — M797 Fibromyalgia: Secondary | ICD-10-CM | POA: Diagnosis not present

## 2019-08-06 DIAGNOSIS — R269 Unspecified abnormalities of gait and mobility: Secondary | ICD-10-CM | POA: Diagnosis not present

## 2019-08-06 DIAGNOSIS — R2689 Other abnormalities of gait and mobility: Secondary | ICD-10-CM | POA: Diagnosis not present

## 2019-08-13 DIAGNOSIS — Z79891 Long term (current) use of opiate analgesic: Secondary | ICD-10-CM | POA: Diagnosis not present

## 2019-08-13 DIAGNOSIS — I5032 Chronic diastolic (congestive) heart failure: Secondary | ICD-10-CM | POA: Diagnosis not present

## 2019-08-13 DIAGNOSIS — E039 Hypothyroidism, unspecified: Secondary | ICD-10-CM | POA: Diagnosis not present

## 2019-08-13 DIAGNOSIS — M545 Low back pain: Secondary | ICD-10-CM | POA: Diagnosis not present

## 2019-09-04 ENCOUNTER — Other Ambulatory Visit: Payer: Self-pay | Admitting: Gastroenterology

## 2019-09-05 ENCOUNTER — Ambulatory Visit (INDEPENDENT_AMBULATORY_CARE_PROVIDER_SITE_OTHER): Payer: PPO | Admitting: Gastroenterology

## 2019-09-05 ENCOUNTER — Encounter: Payer: Self-pay | Admitting: Gastroenterology

## 2019-09-05 ENCOUNTER — Other Ambulatory Visit: Payer: Self-pay

## 2019-09-05 DIAGNOSIS — K3184 Gastroparesis: Secondary | ICD-10-CM | POA: Diagnosis not present

## 2019-09-05 DIAGNOSIS — K529 Noninfective gastroenteritis and colitis, unspecified: Secondary | ICD-10-CM

## 2019-09-05 MED ORDER — LACTASE 9000 UNITS PO CHEW: CHEWABLE_TABLET | ORAL | 11 refills | Status: DC

## 2019-09-05 MED ORDER — DICYCLOMINE HCL 20 MG PO TABS: ORAL_TABLET | ORAL | 11 refills | Status: DC

## 2019-09-05 MED ORDER — LOPERAMIDE HCL 2 MG PO CAPS: ORAL_CAPSULE | ORAL | 11 refills | Status: DC

## 2019-09-05 NOTE — Progress Notes (Signed)
Subjective:    Patient ID: [name], female    DOB: 08-May-1951, 69 y.o.   MRN: [id number]  Primary Care Physician:  Tara Morgan, MD  Primary GI:  Tara Eva, MD   Patient Location: home   Provider Location: [facility] office   Reason for Visit: DIARRHEA   Persons present on the virtual encounter, with roles: patient, myself (provider), MARTINA BOOTH CMA (update meds/allergies)   Total time (minutes) spent on medical discussion:  16 MINUTES   Due to COVID-19, visit was VIA TELEPHONE VISIT DUE TO COVID 19. VISIT IS CONDUCTED VIRTUALLY AND WAS REQUESTED BY PATIENT.   Virtual Visit via TELEPHONE   I connected with [name] and verified that I am speaking with the correct person using two identifiers.   I discussed the limitations, risks, security and privacy concerns of performing an evaluation and management service by telephone/video and the availability of in person appointments. I also discussed with the patient that there may be a patient responsible charge related to this service. The patient expressed understanding and agreed to proceed.   HPI DIARRHEA STILL. TAKING FLUID PILLS. SOME DAYS RUNNING TO BATHROOM. STRAIGHT FLUID AND SOMETIMES FORMED. ACCIDENTS:1-2X/WEEK(FECAL). POO TRIGGERS THE PEE. TAKES BENTYL 2 BEFORE MEALS. EATS THREE TIMES A DAY. HAS YOGURT. MILK: NO  ICE CREAM: 3 DAYS. CHEESE: 1X/WEEK. EATS 8 AM, 12N, BETWEEN 5-7P. NAUSEA: EVERY DAY 3X/DAY. LAST VOMITING: 2 DAYS AGO. THINK SHE WAS EATING TOO FAST AND STOMACH WAS HURTING. ABDOMINAL PAIN: LOWER ACHY, NO RADIATION, BETTER: HAVING A BM OR IN FETAL POSITION. ON OXYGEN CHRONICALLY. HEARTBURN/INDIGESTION: 2-3X/WEEK, BURNING FEELING IN MIDDLE OF THROAT. WEIHGT: 230 LBS, INTENTIONAL. LAST TSH 2 MOS AGO AND DOSE INCREASE  PT DENIES FEVER, CHILLS, HEMATOCHEZIA, HEMATEMESIS, nausea, vomiting, melena, CHEST PAIN, SHORTNESS OF BREATH,  CHANGE IN BOWEL IN HABITS, constipation, problems swallowing, OR heartburn or  indigestion.  Past Medical History:  Diagnosis Date  . Abnormal pulmonary function test   . Anemia    H/H of 10/30 with a normal MCV in 12/09  . Anxiety   . Arthritis   . Barrett's esophagus    Diagnosed 1995. Last EGD 2016-NO BARRETT'S.   . Chest pain    Negative cardiac catheterization in 2002; negative stress nuclear study in 2008  . Chronic anticoagulation   . Chronic combined systolic and diastolic CHF (congestive heart failure) (HCC)    a. EF predominantly normal during prior echoes but was 40% during 10/2014 echo. b. Most recent 01/2015 EF normal, 55-60%.  . Chronic LBP    Surgical intervention in 1996  . Diabetes mellitus, type 2 (HCC)    Insulin therapy; exacerbated by prednisone  . Dysrhythmia    AFib  . Gastroparesis    99% retention 05/2008 on GES  . GERD (gastroesophageal reflux disease)   . Hiatal hernia   . Hyperlipidemia   . Hypertension   . Hypothyroid   . IBS (irritable bowel syndrome)   . Obesity   . OSA on CPAP    had CPAP and cannot tolerate.  . Paroxysmal atrial fibrillation (HCC)   . Pulmonary hypertension (HCC) 01/2015   a. Predominantly pulmonary venous hypertension but may be component of PAH.  . Seizures (HCC)    last seizure was 2 years ago; on keppra for this; unknown etiology  . Syncope    a. Admitted 05/2009; magnetic resonance imagin/ MRA - negative; etiology thought to be orthostasis secondary to drugs and dehydration. b. Syncope 02/2015 also felt 2/2 dehydration.  Past Surgical History:  Procedure Laterality Date  . BACK SURGERY  1996  . BIOPSY N/A 11/08/2013   Procedure: BIOPSY  / Tissue sampling / ulcers present in small intestine;  Surgeon: Danie Binder, MD;  [facility];  Service: Endoscopy;  Laterality: N/A;  . CARDIAC CATHETERIZATION  2002  . CARDIAC CATHETERIZATION N/A 01/26/2015   Procedure: Right Heart Cath;  Surgeon: Larey Dresser, MD;  Location: [place] [facility];  Service: Cardiovascular;  Laterality: N/A;  .  CARDIOVASCULAR STRESS TEST  2008   Stress nuclear study  . CARDIOVERSION N/A 03/06/2015   Procedure: CARDIOVERSION;  Surgeon: Larey Dresser, MD;  Location: [place];  Service: Cardiovascular;  Laterality: N/A;  . CARPAL [place]  . COLONOSCOPY  11/2011   Dr. Oneida Alar: Internal hemorrhoids, mild diverticulosis. Random colon biopsies negative.  . COLONOSCOPY N/A 11/08/2013   SLF: Normal mucosa in the terminal ileum/The colon IS redundant/  Moderate diverticulosis throughout the entire colon. ileum bx benign. colon bx benign  . ESOPHAGOGASTRODUODENOSCOPY  2008   Barrett's without dysplasia. esphagus dilated. antral erosions, h.pylori serologies negative.  . ESOPHAGOGASTRODUODENOSCOPY  11/2011   Dr. Oneida Alar: Barrett's esophagus, mild gastritis, diverticulum in the second portion of the duodenum repeat EGD 3 years. Small bowel biopsies negative. Gastric biopsy show reactive gastropathy but no H. pylori. Esophageal biopsies consistent with GERD. Next EGD 11/2014  . ESOPHAGOGASTRODUODENOSCOPY N/A 11/21/2014   ION:GEXB non-erosive gastritis/irregular z-line  . GIVENS CAPSULE STUDY  12/07/2011   Proximal small bowel, rare AVM. Distal small bowel, multiple ulcers noted  . GIVENS CAPSULE STUDY N/A 09/27/2013   Distal small bowel ulcers extending to TI.  Marland Kitchen GIVENS CAPSULE STUDY N/A 10/10/2013   Procedure: GIVENS CAPSULE STUDY;  Surgeon: Danie Binder, MD;  [facility];  Service: Endoscopy;  Laterality: N/A;  7:30  . IR KYPHO THORACIC WITH BONE BIOPSY  02/09/2018  . KNEE ARTHROSCOPY WITH MEDIAL MENISECTOMY Right 06/09/2016   Procedure: KNEE ARTHROSCOPY WITH MEDIAL MENISECTOMY;  Surgeon: Carole Civil, MD;  Location: [facility];  Service: Orthopedics;  Laterality: Right;  medial and lateral menisectomy  . LAMINECTOMY  1995   L4-L5  . LAPAROSCOPIC CHOLECYSTECTOMY  1990s  . LEFT HEART CATHETERIZATION WITH CORONARY ANGIOGRAM  01/10/2014   Procedure: LEFT HEART CATHETERIZATION WITH CORONARY  ANGIOGRAM;  Surgeon: Sinclair Grooms, MD;  Location[facility];  Service: Cardiovascular;;  . RIGHT HEART CATHETERIZATION N/A 01/10/2014   Procedure: RIGHT HEART CATH;  Surgeon: Sinclair Grooms, MD;  Location: [facility] CATH LAB;  Service: Cardiovascular;  Laterality: N/A;  . TOTAL ABDOMINAL HYSTERECTOMY  1999  . TRACHEOSTOMY TUBE PLACEMENT N/A 09/01/2017   Procedure: TRACHEOSTOMY;  Surgeon: Rozetta Nunnery, MD;  Location: [place];  Service: ENT;  Laterality: N/A;    Allergies  Allergen Reactions  . Celexa [Citalopram Hydrobromide] Other (See Comments)    Dyskinesia  . Codeine Nausea And Vomiting and Other (See Comments)    HALLUCINATIONS  . Dilaudid [Hydromorphone Hcl] Other (See Comments)    Made her pass out  . Reglan [Metoclopramide] Other (See Comments)    DYSKINESIA    Current Outpatient Medications  Medication Sig    . acetaminophen (TYLENOL) 500 MG tablet Take 500 mg by mouth as needed for moderate pain or headache.     . albuterol (PROVENTIL) (2.5 MG/3ML) 0.083% nebulizer solution Take 3 mLs (2.5 mg total) by nebulization every 6 (six) hours as needed for wheezing or shortness  of breath. (Patient taking differently: Take 2.5 mg by nebulization as needed for wheezing or shortness of breath. )    . amiodarone (PACERONE) 200 MG tablet Take 1 tablet (200 mg total) by mouth every morning.    Marland Kitchen apixaban (ELIQUIS) 5 MG TABS tablet Take 1 tablet (5 mg total) by mouth 2 (two) times daily.    . Cholecalciferol (VITAMIN D3) 2000 units capsule Take 2,000 Units by mouth daily.    Marland Kitchen dicyclomine (BENTYL) 10 MG capsule Take 2 capsules (20 mg total) by mouth 2 (two) times daily.    . DULoxetine (CYMBALTA) 30 MG capsule Take 30 mg by mouth 2 (two) times daily.    Marland Kitchen gabapentin (NEURONTIN) 400 MG capsule Take by mouth 3 (three) times daily. Take 1 capsule(400mg ) 3 times daily.    Marland Kitchen glipiZIDE (GLUCOTROL) 10 MG tablet Take 10 mg by mouth 2 (two) times daily.    Marland Kitchen HYDROcodone-acetaminophen (NORCO)  10-325 MG tablet Take 1 tablet by mouth at bedtime. Takes 1/2 tablet at bedtime.    . insulin glargine (LANTUS) 100 UNIT/ML injection Inject 0.55 mLs (55 Units total) into the skin at bedtime.    . levETIRAcetam (KEPPRA) 1000 MG tablet Take 1,500 mg by mouth 2 (two) times daily.     Marland Kitchen levothyroxine (SYNTHROID) 175 MCG tablet Take 1 tablet by mouth daily.    Marland Kitchen loperamide (IMODIUM) 2 MG capsule TAKE ONE CAPSULE BY MOUTH TWICE DAILY AS NEEDED    . LORazepam (ATIVAN) 1 MG tablet Take 1 mg by mouth 3 (three) times daily as needed. for anxiety    . magnesium oxide (MAG-OX) 400 MG tablet Take 1 tablet (400 mg total) by mouth 2 (two) times daily.    . meclizine (ANTIVERT) 25 MG tablet Take 25 mg by mouth as needed.     . metFORMIN (GLUCOPHAGE) 500 MG tablet Take 500 mg by mouth 2 (two) times daily with a meal.    . Multiple Vitamin (MULTIVITAMIN WITH MINERALS) TABS tablet Take 1 tablet by mouth daily.    . naproxen sodium (ALEVE) 220 MG tablet Take 440 mg by mouth as needed (pain).     . ondansetron (ZOFRAN) 4 MG tablet TAKE 1 TABLET BY MOUTH FOUR TIMES DAILY AS NEEDED FOR NAUSEA (Patient taking differently: Take 4 mg by mouth 4 (four) times daily as needed for nausea. )    . OXYGEN 2lpm with sleep and as needed during the day    . pantoprazole (PROTONIX) 40 MG tablet TAKE ONE TABLET BY MOUTH TWICE DAILY BEFORE APPLY MEAL    . potassium chloride (K-DUR) 10 MEQ tablet Take 2 tablets (20 mEq total) by mouth 2 (two) times daily. (Patient taking differently: Take 10 mEq by mouth in the morning and at bedtime. )    . torsemide (DEMADEX) 20 MG tablet TAKE TWO TABLETS BY MOUTH EVERY MORNING AND TAKE 1 TABLET BY MOUTH EVERY EVENING (MAY TAKE EXTRA TABLET AS NEEDED FOR SWELLING)    .      Marland Kitchen        Review of Systems PER HPI OTHERWISE ALL SYSTEMS ARE NEGATIVE.    Objective:   Physical Exam   TELEPHONE VISIT DUE TO COVID 19, VISIT IS CONDUCTED VIRTUALLY AND WAS REQUESTED BY PATIENT.     Assessment & Plan:

## 2019-09-05 NOTE — Patient Instructions (Addendum)
DRINK WATER TO KEEP YOUR URINE LIGHT YELLOW.  FOLLOW A GASTROPARESIS DIET: STEP 1 FOR SEVERE NAUSEA AND VOMITING, STEP 2 FOR MODERATE NAUSEA OR VOMITING STEP 3 WITH MILD NAUSEA OR VOMITING. SEE HANDOUT.  USE ZOFRAN TO CONTROL NAUSEA/VOMITING.    TO BETTER CONTROL DIARRHEA:  1. REDUCE DAIRY INTAKE. IF YOU CONSUME DAIRY, ADD LACTASE ONE PILL EVERY 4 HOURS WITH MEALS UP TO THREE TIMES A DAY.    2. TAKE DICYCLOMINE 20 MG TABLETS ONE 30 MINUTES PRIOR MEALS THREE TIMES A DAY. IT MAY CAUSE DROWSINESS, DRY EYES/MOUTH, BLURRY VISION, OR DIFFICULTY URINATING.    3. USE IMODIUM 1-2 EVERY 4 HOURS WHEN NEEDED TO CONTROL LOOSE STOOL.  FOLLOW UP IN 6 MOS.

## 2019-09-05 NOTE — Assessment & Plan Note (Signed)
SYMPTOMS FAIRLY WELL CONTROLLED.  FOLLOW A GASTROPARESIS DIET/ SEND HANDOUT. USE ZOFRAN TO CONTROL NAUSEA/VOMITING. FOLLOW UP IN 6 MOS.

## 2019-09-05 NOTE — Assessment & Plan Note (Signed)
SYMPTOMS NOT IDEALLY CONTROLLED AND LIKELY DUE TO DAIRY INTAKE/DIABETIC GASTROPARESIS.  DRINK WATER TO KEEP YOUR URINE LIGHT YELLOW. TO BETTER CONTROL DIARRHEA: 1. REDUCE DAIRY INTAKE. IF YOU CONSUME DAIRY, ADD LACTASE ONE PILL EVERY 4 HOURS WITH MEALS UP TO THREE TIMES A DAY.   2. TAKE DICYCLOMINE 20 MG TABLETS ONE 30 MINUTES PRIOR MEALS THREE TIMES A DAY. IT MAY CAUSE DROWSINESS, DRY EYES/MOUTH, BLURRY VISION, OR DIFFICULTY URINATING.   3. USE IMODIUM 1-2 EVERY 4 HOURS WHEN NEEDED TO CONTROL LOOSE STOOL. FOLLOW UP IN 6 MOS.

## 2019-09-06 ENCOUNTER — Encounter: Payer: Self-pay | Admitting: Gastroenterology

## 2019-09-25 DIAGNOSIS — E7800 Pure hypercholesterolemia, unspecified: Secondary | ICD-10-CM | POA: Diagnosis not present

## 2019-09-25 DIAGNOSIS — E1142 Type 2 diabetes mellitus with diabetic polyneuropathy: Secondary | ICD-10-CM | POA: Diagnosis not present

## 2019-09-25 DIAGNOSIS — I509 Heart failure, unspecified: Secondary | ICD-10-CM | POA: Diagnosis not present

## 2019-09-25 DIAGNOSIS — R7989 Other specified abnormal findings of blood chemistry: Secondary | ICD-10-CM | POA: Diagnosis not present

## 2019-09-25 DIAGNOSIS — E039 Hypothyroidism, unspecified: Secondary | ICD-10-CM | POA: Diagnosis not present

## 2019-09-25 DIAGNOSIS — E1165 Type 2 diabetes mellitus with hyperglycemia: Secondary | ICD-10-CM | POA: Diagnosis not present

## 2019-09-30 DIAGNOSIS — M6281 Muscle weakness (generalized): Secondary | ICD-10-CM | POA: Insufficient documentation

## 2019-09-30 DIAGNOSIS — M25561 Pain in right knee: Secondary | ICD-10-CM | POA: Insufficient documentation

## 2019-09-30 DIAGNOSIS — R269 Unspecified abnormalities of gait and mobility: Secondary | ICD-10-CM | POA: Insufficient documentation

## 2019-09-30 DIAGNOSIS — M797 Fibromyalgia: Secondary | ICD-10-CM | POA: Insufficient documentation

## 2019-10-01 DIAGNOSIS — R413 Other amnesia: Secondary | ICD-10-CM | POA: Diagnosis not present

## 2019-10-01 DIAGNOSIS — R569 Unspecified convulsions: Secondary | ICD-10-CM | POA: Diagnosis not present

## 2019-10-01 DIAGNOSIS — M25569 Pain in unspecified knee: Secondary | ICD-10-CM | POA: Diagnosis not present

## 2019-10-01 DIAGNOSIS — M797 Fibromyalgia: Secondary | ICD-10-CM | POA: Diagnosis not present

## 2019-10-01 DIAGNOSIS — M130 Polyarthritis, unspecified: Secondary | ICD-10-CM | POA: Diagnosis not present

## 2019-10-07 DIAGNOSIS — M25569 Pain in unspecified knee: Secondary | ICD-10-CM | POA: Insufficient documentation

## 2019-10-07 DIAGNOSIS — M130 Polyarthritis, unspecified: Secondary | ICD-10-CM | POA: Insufficient documentation

## 2019-10-07 DIAGNOSIS — M797 Fibromyalgia: Secondary | ICD-10-CM | POA: Diagnosis present

## 2019-10-10 ENCOUNTER — Telehealth: Payer: Self-pay

## 2019-10-10 NOTE — Telephone Encounter (Signed)
  Patient Consent for Virtual Visit         [name] has provided verbal consent on 10/10/2019 for a virtual visit (video or telephone).   CONSENT FOR VIRTUAL VISIT FOR:  [name]  By participating in this virtual visit I agree to the following:  I hereby voluntarily request, consent and authorize [facility] and its employed or contracted physicians, physician assistants, nurse practitioners or other licensed health care professionals (the Practitioner), to provide me with telemedicine health care services (the "Services") as deemed necessary by the treating Practitioner. I acknowledge and consent to receive the Services by the Practitioner via telemedicine. I understand that the telemedicine visit will involve communicating with the Practitioner through live audiovisual communication technology and the disclosure of certain medical information by electronic transmission. I acknowledge that I have been given the opportunity to request an in-person assessment or other available alternative prior to the telemedicine visit and am voluntarily participating in the telemedicine visit.  I understand that I have the right to withhold or withdraw my consent to the use of telemedicine in the course of my care at any time, without affecting my right to future care or treatment, and that the Practitioner or I may terminate the telemedicine visit at any time. I understand that I have the right to inspect all information obtained and/or recorded in the course of the telemedicine visit and may receive copies of available information for a reasonable fee.  I understand that some of the potential risks of receiving the Services via telemedicine include:  Marland Kitchen Delay or interruption in medical evaluation due to technological equipment failure or disruption; . Information transmitted may not be sufficient (e.g. poor resolution of images) to allow for appropriate medical decision making by the Practitioner;  and/or  . In rare instances, security protocols could fail, causing a breach of personal health information.  Furthermore, I acknowledge that it is my responsibility to provide information about my medical history, conditions and care that is complete and accurate to the best of my ability. I acknowledge that Practitioner's advice, recommendations, and/or decision may be based on factors not within their control, such as incomplete or inaccurate data provided by me or distortions of diagnostic images or specimens that may result from electronic transmissions. I understand that the practice of medicine is not an exact science and that Practitioner makes no warranties or guarantees regarding treatment outcomes. I acknowledge that a copy of this consent can be made available to me via my patient portal [facility] MyChart), or I can request a printed copy by calling the office of [facility].    I understand that my insurance will be billed for this visit.   I have read or had this consent read to me. . I understand the contents of this consent, which adequately explains the benefits and risks of the Services being provided via telemedicine.  . I have been provided ample opportunity to ask questions regarding this consent and the Services and have had my questions answered to my satisfaction. . I give my informed consent for the services to be provided through the use of telemedicine in my medical care

## 2019-10-11 ENCOUNTER — Telehealth: Payer: PPO | Admitting: Cardiology

## 2019-10-11 NOTE — Progress Notes (Unsigned)
{Choose 1 Note Type (Video or Telephone):[phone]}   The patient was identified using 2 identifiers.  Date:  10/11/2019   ID:  [name], [name] 07-09-1950, MRN [id number]  {Patient [phone]::"Home"} {Provider [phone]::"Home"}  PCP:  Gareth Morgan, MD  Cardiologist:  Dina Rich, MD *** Electrophysiologist:  None   Evaluation Performed:  {Choose Visit [phone]::"Follow-Up Visit"}  Chief Complaint:  ***  History of Present Illness:    [name] is a 69 y.o. female seen today for follow up of the following medical problems.   1. Chronicsystolic/ diastolic CHF with mixed precap and post postcap Pulmonary Hypertension - echo 12/2013 PASP 85, moderate TR, RV mild to moderately dilated with normal function, could not eval diasotlic function due to afib, LVEF 50-55%. Biatrial enlargement.  - RHC 01/2015 PA 38/13 and calculated mean of 21, could not get a wedge however LVEDP 15. - repeat echo 10/2014 PASP 61 mmHg (down from 85 on prior echo), some evidence of RV dysfunction with RV TAPSE 1.4 and tissue anular systolic velocity of 7.5. Cannot evaluate diastolic function, however severe biatrial enlarement suggests significant dysfunction, - 01/2015 RHC mean PA 52, PCWP 31 - 01/2015 echo LVEF 55-60%, PASP 46  - after most recent RHC in 01/2015 she was admitted for diuresis. Discharge weight 170 lbs, reportedly down from 190 on admission. Marland Kitchen Discharged on lasix 60mg  bid.   -admit 11/2017 to [facility] with volume overload. Diuresed 1.4 L, weight 237 to 229 lbs. Cr was stable. Discharged on torsemide 40mg  in AM and 20mg  in PM - 11/2017 echo LVEF 60-65%, no WMAs, grade I diastolic dysfunction, PASP 46. - since discharge doing well. Swelling is improving in legs. Home weights 229 at discahrge, up to 232  Home weights up 245 lbs, now down to 240 lbs. Swelling in legs improving. Taking toresmide 40mg  in AM and 20mg  in PM. She associates weight gain to inactivity  due to recent back injury. Gradual increase of weight she reports.  - BNP lower than baseline, does not appear volume overloaded by exam.    2. Afib - amio started 02/2015.  - has had severe diffusion dfect on PFTs 10/2016prior  - no recent palpitations. No bleeding on eliquis.   3. OSA - could not tolerate CPAP  The patient {does/does not:200015} have symptoms concerning for COVID-19 infection (fever, chills, cough, or new shortness of breath).    Past Medical History:  Diagnosis Date  . Abnormal pulmonary function test   . Anemia    H/H of 10/30 with a normal MCV in 12/09  . Anxiety   . Arthritis   . Barrett's esophagus    Diagnosed 1995. Last EGD 2016-NO BARRETT'S.   . Chest pain    Negative cardiac catheterization in 2002; negative stress nuclear study in 2008  . Chronic anticoagulation   . Chronic combined systolic and diastolic CHF (congestive heart failure) (HCC)    a. EF predominantly normal during prior echoes but was 40% during 10/2014 echo. b. Most recent 01/2015 EF normal, 55-60%.  . Chronic LBP    Surgical intervention in 1996  . Diabetes mellitus, type 2 (HCC)    Insulin therapy; exacerbated by prednisone  . Dysrhythmia    AFib  . Gastroparesis    99% retention 05/2008 on GES  . GERD (gastroesophageal reflux disease)   . Hiatal hernia   . Hyperlipidemia   . Hypertension   . Hypothyroid   . IBS (irritable bowel syndrome)   . Obesity   .  OSA on CPAP    had CPAP and cannot tolerate.  . Paroxysmal atrial fibrillation ([place])   . Pulmonary hypertension ([place]) 01/2015   a. Predominantly pulmonary venous hypertension but may be component of PAH.  . Seizures ([place])    last seizure was 2 years ago; on keppra for this; unknown etiology  . Syncope    a. Admitted 05/2009; magnetic resonance imagin/ MRA - negative; etiology thought to be orthostasis secondary to drugs and dehydration. b. Syncope 02/2015 also felt 2/2 dehydration.   Past Surgical History:   Procedure Laterality Date  . BACK SURGERY  1996  . BIOPSY N/A 11/08/2013   Procedure: BIOPSY  / Tissue sampling / ulcers present in small intestine;  Surgeon: Danie Binder, MD;  [facility];  Service: Endoscopy;  Laterality: N/A;  . CARDIAC CATHETERIZATION  2002  . CARDIAC CATHETERIZATION N/A 01/26/2015   Procedure: Right Heart Cath;  Surgeon: Larey Dresser, MD;  Location: [place] [facility];  Service: Cardiovascular;  Laterality: N/A;  . CARDIOVASCULAR STRESS TEST  2008   Stress nuclear study  . CARDIOVERSION N/A 03/06/2015   Procedure: CARDIOVERSION;  Surgeon: Larey Dresser, MD;  Location: [facility];  Service: Cardiovascular;  Laterality: N/A;  . CARPAL [place]  . COLONOSCOPY  11/2011   Dr. Oneida Alar: Internal hemorrhoids, mild diverticulosis. Random colon biopsies negative.  . COLONOSCOPY N/A 11/08/2013   SLF: Normal mucosa in the terminal ileum/The colon IS redundant/  Moderate diverticulosis throughout the entire colon. ileum bx benign. colon bx benign  . ESOPHAGOGASTRODUODENOSCOPY  2008   Barrett's without dysplasia. esphagus dilated. antral erosions, h.pylori serologies negative.  . ESOPHAGOGASTRODUODENOSCOPY  11/2011   Dr. Oneida Alar: Barrett's esophagus, mild gastritis, diverticulum in the second portion of the duodenum repeat EGD 3 years. Small bowel biopsies negative. Gastric biopsy show reactive gastropathy but no H. pylori. Esophageal biopsies consistent with GERD. Next EGD 11/2014  . ESOPHAGOGASTRODUODENOSCOPY N/A 11/21/2014   EXB:MWUX non-erosive gastritis/irregular z-line  . GIVENS CAPSULE STUDY  12/07/2011   Proximal small bowel, rare AVM. Distal small bowel, multiple ulcers noted  . GIVENS CAPSULE STUDY N/A 09/27/2013   Distal small bowel ulcers extending to TI.  Marland Kitchen GIVENS CAPSULE STUDY N/A 10/10/2013   Procedure: GIVENS CAPSULE STUDY;  Surgeon: Danie Binder, MD;  [facility];  Service: Endoscopy;  Laterality: N/A;  7:30  . IR KYPHO THORACIC  WITH BONE BIOPSY  02/09/2018  . KNEE ARTHROSCOPY WITH MEDIAL MENISECTOMY Right 06/09/2016   Procedure: KNEE ARTHROSCOPY WITH MEDIAL MENISECTOMY;  Surgeon: Carole Civil, MD;  Location: [facility];  Service: Orthopedics;  Laterality: Right;  medial and lateral menisectomy  . LAMINECTOMY  1995   L4-L5  . LAPAROSCOPIC CHOLECYSTECTOMY  1990s  . LEFT HEART CATHETERIZATION WITH CORONARY ANGIOGRAM  01/10/2014   Procedure: LEFT HEART CATHETERIZATION WITH CORONARY ANGIOGRAM;  Surgeon: Sinclair Grooms, MD;  Location[facility] C[facility] LAB;  Service: Cardiovascular;;  . RIGHT HEART CATHETERIZATION N/A 01/10/2014   Procedure: RIGHT HEART CATH;  Surgeon: Sinclair Grooms, MD;  Location: [facility] CATH LAB;  Service: Cardiovascular;  Laterality: N/A;  . TOTAL ABDOMINAL HYSTERECTOMY  1999  . TRACHEOSTOMY TUBE PLACEMENT N/A 09/01/2017   Procedure: TRACHEOSTOMY;  Surgeon: Rozetta Nunnery, MD;  Location: [facility] OR;  Service: ENT;  Laterality: N/A;     No outpatient medications have been marked as taking for the 10/11/19 encounter (Appointment) with Arnoldo Lenis, MD.     Allergies:  Celexa [citalopram hydrobromide], Codeine, Dilaudid [hydromorphone hcl], Reglan [metoclopramide], and Hyoscyamine   Social History   Tobacco Use  . Smoking status: Former Smoker    Packs/day: 0.25    Years: 15.00    Pack years: 3.75    Types: Cigarettes    Start date: 02/26/1966    Quit date: 07/01/1983    Years since quitting: 36.3  . Smokeless tobacco: Never Used  . Tobacco comment: quit in 1984  Substance Use Topics  . Alcohol use: No    Alcohol/week: 0.0 standard drinks  . Drug use: No     Family Hx: The patient's family history includes Alzheimer's disease in her mother; Breast cancer in her sister; Heart attack in her mother; Hypertension in her mother and another family member; Stroke in her mother. There is no history of Heart disease or Colon cancer.  ROS:   Please see the history of present illness.    *** All other  systems reviewed and are negative.   Prior CV studies:   The following studies were reviewed today: 12/2013 Echo Study Conclusions  - Limited study to evaluate LV systolic function. - Left ventricle: The cavity size was normal. Wall thickness was normal. Systolic function was normal. The estimated ejection fraction was in the range of 50% to 55%. Wall motion was normal; there were no regional wall motion abnormalities. The study was not technically sufficient to allow evaluation of LV diastolic dysfunction due to atrial fibrillation. - Aortic valve: Valve area (VTI): 2.49 cm^2. Valve area (Vmax): 2.11 cm^2. - Mitral valve: There was moderate regurgitation. The MR vena contracta is 0.4 cm. - Left atrium: The atrium was severely dilated. - Right ventricle: The cavity size was mildly to moderately dilated. - Right atrium: The atrium was severely dilated. - Atrial septum: From available images no evidence of ASD or PFO by 2D or color Doppler images. - Tricuspid valve: There was moderate regurgitation. - Pulmonary arteries: Systolic pressure was severely increased. PA peak pressure: 85 mm Hg (S).  01/2014 RHC HEMODYNAMICS: Aortic pressure 123/73 mmHg; LV pressure 122/6 mmHg; LVEDP 15 mm mercury; RA 4 mmHg; RV 31/5; PA 38/13 mmHg; PCWP(mean) unable to wedge the balloon tip catheter; Cardiac Output by Fick 4.73 L per minute  ANGIOGRAPHIC DATA: The left main coronary artery is normal.  The left anterior descending artery is widely patent, traverses the LV apex, gives origin to 2 diagonals, and has no significant obstruction..  The left circumflex artery is widely patent and gives a single large obtuse marginal [place] Gut that trifurcates on the lateral wall. No significant obstruction.  The right coronary artery is dominant, gives 3 left ventricular branches and a large bifurcating PDA. No obstructions noted.Marland Kitchen  LEFT VENTRICULOGRAM: Left ventricular angiogram  was done in the 30 RAO projection and revealed normal LV cavity size, tachycardia due to atrial fibrillation, and mild global reduction in LV function with an estimated ejection fraction of 40%   IMPRESSIONS: 1. Widely patent coronary arteries 2. Mild pulmonary hypertension 3. Mildly depressed LV systolic function with an estimated EF of 40% 3. This did not confirm significant pulmonary hypertension. The procedure did suggest that the right atrium and right ventricle were enlarged as we had difficulty manipulating the right heart catheter into the pulmonary artery.   RECOMMENDATION: Back to the outpatient area at home later today if no complications. She should not resume eloquence until 24 hours post procedure..  10/2014 Echo Study Conclusions  - Left ventricle: The cavity size was normal.  Wall thickness was increased in a pattern of mild LVH. There was mild concentric hypertrophy. The estimated ejection fraction was 40%. Diffuse hypokinesis. - Mitral valve: Calcified annulus. There was mild regurgitation. - Left atrium: The atrium was severely dilated. - Right atrium: The atrium was severely dilated. - Atrial septum: Thinned and stretched but no obvious PFO. No defect or patent foramen ovale was identified. - Tricuspid valve: There was moderate regurgitation. - Impressions: E/E&' not reported and in afib diastolic evaluation not possible.  Impressions:  - E/E&' not reported and in afib diastolic evaluation not possible.   01/2015 RHC    1. Severely elevated right and left heart filling pressures.  2. Primarily pulmonary venous hypertension (may be additional component of PAH with PVR 4.6 WU).   Plan:  I am going to admit her for IV diuresis.      Right Heart Pressures RHC Procedural Findings: Hemodynamics (mmHg) RA mean 23 RV 68/14 PA 70/36, mean 52 PCWP mean 31  Oxygen saturations: PA 60% AO 98%  Cardiac Output (Fick) 4.54  Cardiac  Index (Fick) 2.32  PVR 4.6 WU    11/2017 echo Study Conclusions  - Left ventricle: The cavity size was normal. There was moderate concentric hypertrophy. Systolic function was normal. The estimated ejection fraction was in the range of 60% to 65%. Wall motion was normal; there were no regional wall motion abnormalities. Doppler parameters are consistent with abnormal left ventricular relaxation (grade 1 diastolic dysfunction). - Aortic valve: Transvalvular velocity was within the normal range. There was no stenosis. There was trivial regurgitation. - Mitral valve: Transvalvular velocity was within the normal range. There was no evidence for stenosis. There was no regurgitation. - Left atrium: The atrium was severely dilated. - Right ventricle: The cavity size was normal. Wall thickness was normal. Systolic function was normal. - Tricuspid valve: There was mild regurgitation. - Pulmonary arteries: PA peak pressure: 46 mm Hg (S).   Labs/Other Tests and Data Reviewed:    EKG:  {EKG/Telemetry Strips Reviewed:[phone]}  Recent Labs: No results found for requested labs within last 8760 hours.   Recent Lipid Panel Lab Results  Component Value Date/Time   CHOL 167 02/16/2018 10:43 AM   TRIG 179 (H) 02/16/2018 10:43 AM   HDL 38 (L) 02/16/2018 10:43 AM   CHOLHDL 4.4 02/16/2018 10:43 AM   LDLCALC 93 02/16/2018 10:43 AM    Wt Readings from Last 3 Encounters:  02/22/19 241 lb (109.3 kg)  08/31/18 235 lb 1.6 oz (106.6 kg)  08/25/18 243 lb (110.2 kg)     Objective:    Vital Signs:  There were no vitals taken for this visit.   {[facility] Virtual Exam (Optional):[phone]::"VITAL SIGNS:  reviewed"}  ASSESSMENT & PLAN:    1.Chronic diastolic HF with mixed pulmonary HTN -recent weight gain appears to be due to inactivity, calorie intake as opposed to fluid. No significant volume overload by exam, her recent BNP was lowest its been in several months -  continue current diuretics. Weight trending down with exercise and better dietary habits  2. Afib - doing well without symptoms, continue current meds along with anticoag  COVID-19 Education: The signs and symptoms of COVID-19 were discussed with the patient and how to seek care for testing (follow up with PCP or arrange E-visit).  ***The importance of social distancing was discussed today.  Time:   Today, I have spent *** minutes with the patient with telehealth technology discussing the above problems.     Medication Adjustments/Labs  and Tests Ordered: Current medicines are reviewed at length with the patient today.  Concerns regarding medicines are outlined above.   Tests Ordered: No orders of the defined types were placed in this encounter.   Medication Changes: No orders of the defined types were placed in this encounter.   Follow Up:  {F/U Format:[phone]} {follow up:15908}  Signed, Dina Rich, MD  10/11/2019 12:45 PM    [facility]

## 2019-10-15 ENCOUNTER — Telehealth (INDEPENDENT_AMBULATORY_CARE_PROVIDER_SITE_OTHER): Payer: PPO | Admitting: Cardiology

## 2019-10-15 ENCOUNTER — Encounter: Payer: Self-pay | Admitting: Cardiology

## 2019-10-15 VITALS — BP 100/57 | HR 59 | Ht 65.0 in | Wt 234.0 lb

## 2019-10-15 DIAGNOSIS — I480 Paroxysmal atrial fibrillation: Secondary | ICD-10-CM

## 2019-10-15 DIAGNOSIS — I5032 Chronic diastolic (congestive) heart failure: Secondary | ICD-10-CM | POA: Diagnosis not present

## 2019-10-15 MED ORDER — TORSEMIDE 20 MG PO TABS: ORAL_TABLET | ORAL | 11 refills | Status: DC

## 2019-10-15 NOTE — Progress Notes (Signed)
Virtual Visit via Telephone Note   This visit type was conducted due to national recommendations for restrictions regarding the COVID-19 Pandemic (e.g. social distancing) in an effort to limit this patient's exposure and mitigate transmission in our community.  Due to her co-morbid illnesses, this patient is at least at moderate risk for complications without adequate follow up.  This format is felt to be most appropriate for this patient at this time.  The patient did not have access to video technology/had technical difficulties with video requiring transitioning to audio format only (telephone).  All issues noted in this document were discussed and addressed.  No physical exam could be performed with this format.  Please refer to the patient's chart for her  consent to telehealth for [facility].   The patient was identified using 2 identifiers.  Date:  10/15/2019   ID:  Tara, [name] 1950-11-03, MRN [id number]  Patient Location: Home Provider Location: Office  PCP:  Gareth Morgan, MD  Cardiologist:  Dina Rich, MD  Electrophysiologist:  None   Evaluation Performed:  Follow-Up Visit  Chief Complaint:  Follow up visit  History of Present Illness:    [name] is a 69 y.o. female seen today for follow up of the following medical problems.   1. Chronicsystolic/ diastolic CHF with mixed precap and post postcap Pulmonary Hypertension - echo 12/2013 PASP 85, moderate TR, RV mild to moderately dilated with normal function, could not eval diasotlic function due to afib, LVEF 50-55%. Biatrial enlargement.  - RHC 01/2015 PA 38/13 and calculated mean of 21, could not get a wedge however LVEDP 15. - repeat echo 10/2014 PASP 61 mmHg (down from 85 on prior echo), some evidence of RV dysfunction with RV TAPSE 1.4 and tissue anular systolic velocity of 7.5. Cannot evaluate diastolic function, however severe biatrial enlarement suggests significant dysfunction, - 01/2015 RHC mean  PA 52, PCWP 31 - 01/2015 echo LVEF 55-60%, PASP 46  - after most recent RHC in 01/2015 she was admitted for diuresis. Discharge weight 170 lbs, reportedly down from 190 on admission. Marland Kitchen Discharged on lasix 60mg  bid.   -admit 11/2017 to [facility] with volume overload. Diuresed 1.4 L, weight 237 to 229 lbs. Cr was stable. Discharged on torsemide 40mg  in AM and 20mg  in PM - 11/2017 echo LVEF 60-65%, no WMAs, grade I diastolic dysfunction, PASP 46. - since discharge doing well. Swelling is improving in legs.Home weights 229 at discahrge, up to 232  Home weights up 245 lbs, now down to 240 lbs. Swelling in legs improving. Taking toresmide 40mg  in AM and 20mg  in PM. She associates weight gain to inactivity due to recent back injury. Gradual increase of weight she reports.  - BNP lower than baseline, does not appear volume overloaded by exam.  - weights today 234 lbs -    2. Afib - amio started 02/2015.  - has had severe diffusion dfect on PFTs 10/2016prior  - no recent palpitations - no bleeding on eliquis.     3. OSA - could not tolerate CPAP   SH: completed her covid vaccine x2.   The patient does not have symptoms concerning for COVID-19 infection (fever, chills, cough, or new shortness of breath).    Past Medical History:  Diagnosis Date  . Abnormal pulmonary function test   . Anemia    H/H of 10/30 with a normal MCV in 12/09  . Anxiety   . Arthritis   . Barrett's esophagus  Diagnosed 1995. Last EGD 2016-NO BARRETT'S.   . Chest pain    Negative cardiac catheterization in 2002; negative stress nuclear study in 2008  . Chronic anticoagulation   . Chronic combined systolic and diastolic CHF (congestive heart failure) (HCC)    a. EF predominantly normal during prior echoes but was 40% during 10/2014 echo. b. Most recent 01/2015 EF normal, 55-60%.  . Chronic LBP    Surgical intervention in 1996  . Diabetes mellitus, type 2 (HCC)    Insulin therapy; exacerbated by  prednisone  . Dysrhythmia    AFib  . Gastroparesis    99% retention 05/2008 on GES  . GERD (gastroesophageal reflux disease)   . Hiatal hernia   . Hyperlipidemia   . Hypertension   . Hypothyroid   . IBS (irritable bowel syndrome)   . Obesity   . OSA on CPAP    had CPAP and cannot tolerate.  . Paroxysmal atrial fibrillation (HCC)   . Pulmonary hypertension (HCC) 01/2015   a. Predominantly pulmonary venous hypertension but may be component of PAH.  . Seizures (HCC)    last seizure was 2 years ago; on keppra for this; unknown etiology  . Syncope    a. Admitted 05/2009; magnetic resonance imagin/ MRA - negative; etiology thought to be orthostasis secondary to drugs and dehydration. b. Syncope 02/2015 also felt 2/2 dehydration.   Past Surgical History:  Procedure Laterality Date  . BACK SURGERY  1996  . BIOPSY N/A 11/08/2013   Procedure: BIOPSY  / Tissue sampling / ulcers present in small intestine;  Surgeon: West Bali, MD;  Location: [facility];  Service: Endoscopy;  Laterality: N/A;  . CARDIAC CATHETERIZATION  2002  . CARDIAC CATHETERIZATION N/A 01/26/2015   Procedure: Right Heart Cath;  Surgeon: Laurey Morale, MD;  Location: [facility] INVASIVE CV LAB;  Service: Cardiovascular;  Laterality: N/A;  . CARDIOVASCULAR STRESS TEST  2008   Stress nuclear study  . CARDIOVERSION N/A 03/06/2015   Procedure: CARDIOVERSION;  Surgeon: Laurey Morale, MD;  Location: [facility] ENDOSCOPY;  Service: Cardiovascular;  Laterality: N/A;  . CARPAL TUNNEL RELEASE  1994  . COLONOSCOPY  11/2011   Dr. Darrick Penna: Internal hemorrhoids, mild diverticulosis. Random colon biopsies negative.  . COLONOSCOPY N/A 11/08/2013   SLF: Normal mucosa in the terminal ileum/The colon IS redundant/  Moderate diverticulosis throughout the entire colon. ileum bx benign. colon bx benign  . ESOPHAGOGASTRODUODENOSCOPY  2008   Barrett's without dysplasia. esphagus dilated. antral erosions, h.pylori serologies negative.  .  ESOPHAGOGASTRODUODENOSCOPY  11/2011   Dr. Darrick Penna: Barrett's esophagus, mild gastritis, diverticulum in the second portion of the duodenum repeat EGD 3 years. Small bowel biopsies negative. Gastric biopsy show reactive gastropathy but no H. pylori. Esophageal biopsies consistent with GERD. Next EGD 11/2014  . ESOPHAGOGASTRODUODENOSCOPY N/A 11/21/2014   YYF:RTMY non-erosive gastritis/irregular z-line  . GIVENS CAPSULE STUDY  12/07/2011   Proximal small bowel, rare AVM. Distal small bowel, multiple ulcers noted  . GIVENS CAPSULE STUDY N/A 09/27/2013   Distal small bowel ulcers extending to TI.  Marland Kitchen GIVENS CAPSULE STUDY N/A 10/10/2013   Procedure: GIVENS CAPSULE STUDY;  Surgeon: West Bali, MD;  Location: [facility];  Service: Endoscopy;  Laterality: N/A;  7:30  . IR KYPHO THORACIC WITH BONE BIOPSY  02/09/2018  . KNEE ARTHROSCOPY WITH MEDIAL MENISECTOMY Right 06/09/2016   Procedure: KNEE ARTHROSCOPY WITH MEDIAL MENISECTOMY;  Surgeon: Vickki Hearing, MD;  Location: [facility] ORS;  Service: Orthopedics;  Laterality: Right;  medial and lateral menisectomy  . LAMINECTOMY  1995   L4-L5  . LAPAROSCOPIC CHOLECYSTECTOMY  1990s  . LEFT HEART CATHETERIZATION WITH CORONARY ANGIOGRAM  01/10/2014   Procedure: LEFT HEART CATHETERIZATION WITH CORONARY ANGIOGRAM;  Surgeon: Lesleigh Noe, MD;  Location: [facility] CATH LAB;  Service: Cardiovascular;;  . RIGHT HEART CATHETERIZATION N/A 01/10/2014   Procedure: RIGHT HEART CATH;  Surgeon: Lesleigh Noe, MD;  Location: [facility] CATH LAB;  Service: Cardiovascular;  Laterality: N/A;  . TOTAL ABDOMINAL HYSTERECTOMY  1999  . TRACHEOSTOMY TUBE PLACEMENT N/A 09/01/2017   Procedure: TRACHEOSTOMY;  Surgeon: Drema Halon, MD;  Location[facility] OR;  Service: ENT;  Laterality: N/A;     No outpatient medications have been marked as taking for the 10/15/19 encounter (Appointment) with Antoine Poche, MD.     Allergies:   Celexa [citalopram hydrobromide], Codeine, Dilaudid [hydromorphone  hcl], Reglan [metoclopramide], and Hyoscyamine   Social History   Tobacco Use  . Smoking status: Former Smoker    Packs/day: 0.25    Years: 15.00    Pack years: 3.75    Types: Cigarettes    Start date: 02/26/1966    Quit date: 07/01/1983    Years since quitting: 36.3  . Smokeless tobacco: Never Used  . Tobacco comment: quit in 1984  Substance Use Topics  . Alcohol use: No    Alcohol/week: 0.0 standard drinks  . Drug use: No     Family Hx: The patient's family history includes Alzheimer's disease in her mother; Breast cancer in her sister; Heart attack in her mother; Hypertension in her mother and another family member; Stroke in her mother. There is no history of Heart disease or Colon cancer.  ROS:   Please see the history of present illness.     All other systems reviewed and are negative.   Prior CV studies:   The following studies were reviewed today:  12/2013 Echo Study Conclusions  - Limited study to evaluate LV systolic function. - Left ventricle: The cavity size was normal. Wall thickness was normal. Systolic function was normal. The estimated ejection fraction was in the range of 50% to 55%. Wall motion was normal; there were no regional wall motion abnormalities. The study was not technically sufficient to allow evaluation of LV diastolic dysfunction due to atrial fibrillation. - Aortic valve: Valve area (VTI): 2.49 cm^2. Valve area (Vmax): 2.11 cm^2. - Mitral valve: There was moderate regurgitation. The MR vena contracta is 0.4 cm. - Left atrium: The atrium was severely dilated. - Right ventricle: The cavity size was mildly to moderately dilated. - Right atrium: The atrium was severely dilated. - Atrial septum: From available images no evidence of ASD or PFO by 2D or color Doppler images. - Tricuspid valve: There was moderate regurgitation. - Pulmonary arteries: Systolic pressure was severely increased. PA peak pressure: 85 mm Hg  (S).  01/2014 RHC HEMODYNAMICS: Aortic pressure 123/73 mmHg; LV pressure 122/6 mmHg; LVEDP 15 mm mercury; RA 4 mmHg; RV 31/5; PA 38/13 mmHg; PCWP(mean) unable to wedge the balloon tip catheter; Cardiac Output by Fick 4.73 L per minute  ANGIOGRAPHIC DATA: The left main coronary artery is normal.  The left anterior descending artery is widely patent, traverses the LV apex, gives origin to 2 diagonals, and has no significant obstruction..  The left circumflex artery is widely patent and gives a single large obtuse marginal Mieka Leaton that trifurcates on the lateral wall. No significant obstruction.  The right coronary artery is dominant, gives  3 left ventricular branches and a large bifurcating PDA. No obstructions noted.Marland Kitchen  LEFT VENTRICULOGRAM: Left ventricular angiogram was done in the 30 RAO projection and revealed normal LV cavity size, tachycardia due to atrial fibrillation, and mild global reduction in LV function with an estimated ejection fraction of 40%   IMPRESSIONS: 1. Widely patent coronary arteries 2. Mild pulmonary hypertension 3. Mildly depressed LV systolic function with an estimated EF of 40% 3. This did not confirm significant pulmonary hypertension. The procedure did suggest that the right atrium and right ventricle were enlarged as we had difficulty manipulating the right heart catheter into the pulmonary artery.   RECOMMENDATION: Back to the outpatient area at home later today if no complications. She should not resume eloquence until 24 hours post procedure..  10/2014 Echo Study Conclusions  - Left ventricle: The cavity size was normal. Wall thickness was increased in a pattern of mild LVH. There was mild concentric hypertrophy. The estimated ejection fraction was 40%. Diffuse hypokinesis. - Mitral valve: Calcified annulus. There was mild regurgitation. - Left atrium: The atrium was severely dilated. - Right atrium: The atrium was severely  dilated. - Atrial septum: Thinned and stretched but no obvious PFO. No defect or patent foramen ovale was identified. - Tricuspid valve: There was moderate regurgitation. - Impressions: E/E&' not reported and in afib diastolic evaluation not possible.  Impressions:  - E/E&' not reported and in afib diastolic evaluation not possible.   01/2015 RHC    1. Severely elevated right and left heart filling pressures.  2. Primarily pulmonary venous hypertension (may be additional component of PAH with PVR 4.6 WU).   Plan:  I am going to admit her for IV diuresis.      Right Heart Pressures RHC Procedural Findings: Hemodynamics (mmHg) RA mean 23 RV 68/14 PA 70/36, mean 52 PCWP mean 31  Oxygen saturations: PA 60% AO 98%  Cardiac Output (Fick) 4.54  Cardiac Index (Fick) 2.32  PVR 4.6 WU    11/2017 echo Study Conclusions  - Left ventricle: The cavity size was normal. There was moderate concentric hypertrophy. Systolic function was normal. The estimated ejection fraction was in the range of 60% to 65%. Wall motion was normal; there were no regional wall motion abnormalities. Doppler parameters are consistent with abnormal left ventricular relaxation (grade 1 diastolic dysfunction). - Aortic valve: Transvalvular velocity was within the normal range. There was no stenosis. There was trivial regurgitation. - Mitral valve: Transvalvular velocity was within the normal range. There was no evidence for stenosis. There was no regurgitation. - Left atrium: The atrium was severely dilated. - Right ventricle: The cavity size was normal. Wall thickness was normal. Systolic function was normal. - Tricuspid valve: There was mild regurgitation. - Pulmonary arteries: PA peak pressure: 46 mm Hg (S).   Labs/Other Tests and Data Reviewed:    EKG:  No ECG reviewed.  Recent Labs: No results found for requested labs within last 8760 hours.   Recent Lipid  Panel Lab Results  Component Value Date/Time   CHOL 167 02/16/2018 10:43 AM   TRIG 179 (H) 02/16/2018 10:43 AM   HDL 38 (L) 02/16/2018 10:43 AM   CHOLHDL 4.4 02/16/2018 10:43 AM   LDLCALC 93 02/16/2018 10:43 AM    Wt Readings from Last 3 Encounters:  02/22/19 241 lb (109.3 kg)  08/31/18 235 lb 1.6 oz (106.6 kg)  08/25/18 243 lb (110.2 kg)     Objective:    Vital Signs:   Today's Vitals  10/15/19 0822  BP: (!) 100/57  Pulse: (!) 59  Weight: 234 lb (106.1 kg)  Height: 5\' 5"  (1.651 m)   Body mass index is 38.94 kg/m. Normal affect. Normal speech pattern and tone. Comfortable, no apparent distress. No audible signs of sob or wheezing.   ASSESSMENT & PLAN:    1.Chronic diastolic HF with mixed pulmonary HTN -weight down 6 lbs since our last visit. Denies any significant edema. Nonspecific symptoms of genrealized fatigue and dizziness, unclear if could be dehydration. She has had intermittent issues with diarrhea and N/V, remains on her home toresmide - change torsemide to 40mg  in AM, take additional 20mg  later in the day if weight above 240 lbs - f/u with pcp for generalized fatigue, no specific cardiopulmonary symptoms.   2. Afib - no symptoms, conitnue current meds   COVID-19 Education: The signs and symptoms of COVID-19 were discussed with the patient and how to seek care for testing (follow up with PCP or arrange E-visit).  The importance of social distancing was discussed today.  Time:   Today, I have spent 21 minutes with the patient with telehealth technology discussing the above problems.     Medication Adjustments/Labs and Tests Ordered: Current medicines are reviewed at length with the patient today.  Concerns regarding medicines are outlined above.   Tests Ordered: No orders of the defined types were placed in this encounter.   Medication Changes: No orders of the defined types were placed in this encounter.   Follow Up:  Either In Person or Virtual  in 4 month(s)  Signed, Carlyle Dolly, MD  10/15/2019 8:16 AM    [place]

## 2019-10-15 NOTE — Patient Instructions (Signed)
Medication Instructions:  Your physician has recommended you make the following change in your medication:  Torsemide 40 mg take additional 20 mg later in day if weight above 240   *If you need a refill on your cardiac medications before your next appointment, please call your pharmacy*   Lab Work: NONE   If you have labs (blood work) drawn today and your tests are completely normal, you will receive your results only by: Marland Kitchen MyChart Message (if you have MyChart) OR . A paper copy in the mail If you have any lab test that is abnormal or we need to change your treatment, we will call you to review the results.   Testing/Procedures: NONE    Follow-Up: At [facility], you and your health needs are our priority.  As part of our continuing mission to provide you with exceptional heart care, we have created designated Provider Care Teams.  These Care Teams include your primary Cardiologist (physician) and Advanced Practice Providers (APPs -  Physician Assistants and Nurse Practitioners) who all work together to provide you with the care you need, when you need it.  We recommend signing up for the patient portal called "MyChart".  Sign up information is provided on this After Visit Summary.  MyChart is used to connect with patients for Virtual Visits (Telemedicine).  Patients are able to view lab/test results, encounter notes, upcoming appointments, etc.  Non-urgent messages can be sent to your provider as well.   To learn more about what you can do with MyChart, go to [email or web address].    Your next appointment:   4 month(s)  The format for your next appointment:   Either In Person or Virtual  Provider:   Dina Rich, MD   Other Instructions Thank you for choosing [facility]!

## 2019-10-15 NOTE — Addendum Note (Signed)
Addended by: Kerney Elbe on: 10/15/2019 09:44 AM   Modules accepted: Orders

## 2019-11-05 DIAGNOSIS — Z79891 Long term (current) use of opiate analgesic: Secondary | ICD-10-CM | POA: Diagnosis not present

## 2019-11-05 DIAGNOSIS — Z9981 Dependence on supplemental oxygen: Secondary | ICD-10-CM | POA: Diagnosis not present

## 2019-11-05 DIAGNOSIS — I5032 Chronic diastolic (congestive) heart failure: Secondary | ICD-10-CM | POA: Diagnosis not present

## 2019-11-05 DIAGNOSIS — M545 Low back pain: Secondary | ICD-10-CM | POA: Diagnosis not present

## 2019-11-16 ENCOUNTER — Emergency Department (HOSPITAL_COMMUNITY): Payer: PPO

## 2019-11-16 ENCOUNTER — Encounter (HOSPITAL_COMMUNITY): Payer: Self-pay | Admitting: *Deleted

## 2019-11-16 ENCOUNTER — Other Ambulatory Visit: Payer: Self-pay

## 2019-11-16 ENCOUNTER — Emergency Department (HOSPITAL_COMMUNITY)
Admission: EM | Admit: 2019-11-16 | Discharge: 2019-11-16 | Disposition: A | Discharge status: Home / Self Care | Payer: PPO | Attending: Emergency Medicine | Admitting: Emergency Medicine

## 2019-11-16 DIAGNOSIS — Z7901 Long term (current) use of anticoagulants: Secondary | ICD-10-CM | POA: Diagnosis not present

## 2019-11-16 DIAGNOSIS — Z20822 Contact with and (suspected) exposure to covid-19: Secondary | ICD-10-CM | POA: Insufficient documentation

## 2019-11-16 DIAGNOSIS — I5032 Chronic diastolic (congestive) heart failure: Secondary | ICD-10-CM | POA: Insufficient documentation

## 2019-11-16 DIAGNOSIS — R0902 Hypoxemia: Secondary | ICD-10-CM | POA: Diagnosis not present

## 2019-11-16 DIAGNOSIS — I110 Hypertensive heart disease with heart failure: Secondary | ICD-10-CM | POA: Diagnosis not present

## 2019-11-16 DIAGNOSIS — E119 Type 2 diabetes mellitus without complications: Secondary | ICD-10-CM | POA: Diagnosis not present

## 2019-11-16 DIAGNOSIS — Z79899 Other long term (current) drug therapy: Secondary | ICD-10-CM | POA: Diagnosis not present

## 2019-11-16 DIAGNOSIS — Z794 Long term (current) use of insulin: Secondary | ICD-10-CM | POA: Insufficient documentation

## 2019-11-16 DIAGNOSIS — R079 Chest pain, unspecified: Secondary | ICD-10-CM | POA: Diagnosis not present

## 2019-11-16 DIAGNOSIS — R442 Other hallucinations: Secondary | ICD-10-CM | POA: Diagnosis not present

## 2019-11-16 DIAGNOSIS — Z87891 Personal history of nicotine dependence: Secondary | ICD-10-CM | POA: Insufficient documentation

## 2019-11-16 DIAGNOSIS — J189 Pneumonia, unspecified organism: Secondary | ICD-10-CM | POA: Diagnosis not present

## 2019-11-16 DIAGNOSIS — N3000 Acute cystitis without hematuria: Secondary | ICD-10-CM | POA: Diagnosis not present

## 2019-11-16 DIAGNOSIS — R4182 Altered mental status, unspecified: Secondary | ICD-10-CM | POA: Diagnosis not present

## 2019-11-16 DIAGNOSIS — R531 Weakness: Secondary | ICD-10-CM | POA: Diagnosis not present

## 2019-11-16 DIAGNOSIS — R410 Disorientation, unspecified: Secondary | ICD-10-CM | POA: Diagnosis not present

## 2019-11-16 DIAGNOSIS — R918 Other nonspecific abnormal finding of lung field: Secondary | ICD-10-CM | POA: Diagnosis not present

## 2019-11-16 LAB — COMPREHENSIVE METABOLIC PANEL
ALT: 15 U/L (ref 0–44)
AST: 17 U/L (ref 15–41)
Albumin: 3.3 g/dL — ABNORMAL LOW (ref 3.5–5.0)
Alkaline Phosphatase: 69 U/L (ref 38–126)
Anion gap: 10 (ref 5–15)
BUN: 22 mg/dL (ref 8–23)
CO2: 31 mmol/L (ref 22–32)
Calcium: 8.7 mg/dL — ABNORMAL LOW (ref 8.9–10.3)
Chloride: 97 mmol/L — ABNORMAL LOW (ref 98–111)
Creatinine, Ser: 1.25 mg/dL — ABNORMAL HIGH (ref 0.44–1.00)
GFR calc Af Amer: 50 mL/min — ABNORMAL LOW (ref >60)
GFR calc non Af Amer: 44 mL/min — ABNORMAL LOW (ref >60)
Glucose, Bld: 169 mg/dL — ABNORMAL HIGH (ref 70–99)
Potassium: 4.3 mmol/L (ref 3.5–5.1)
Sodium: 138 mmol/L (ref 135–145)
Total Bilirubin: 0.1 mg/dL — ABNORMAL LOW (ref 0.3–1.2)
Total Protein: 7 g/dL (ref 6.5–8.1)

## 2019-11-16 LAB — CBC WITH DIFFERENTIAL/PLATELET
Abs Immature Granulocytes: 0.05 10*3/uL (ref 0.00–0.07)
Basophils Absolute: 0.1 10*3/uL (ref 0.0–0.1)
Basophils Relative: 1 %
Eosinophils Absolute: 0.3 10*3/uL (ref 0.0–0.5)
Eosinophils Relative: 3 %
HCT: 32.4 % — ABNORMAL LOW (ref 36.0–46.0)
Hemoglobin: 9.9 g/dL — ABNORMAL LOW (ref 12.0–15.0)
Immature Granulocytes: 1 %
Lymphocytes Relative: 23 %
Lymphs Abs: 2.4 10*3/uL (ref 0.7–4.0)
MCH: 32.7 pg (ref 26.0–34.0)
MCHC: 30.6 g/dL (ref 30.0–36.0)
MCV: 106.9 fL — ABNORMAL HIGH (ref 80.0–100.0)
Monocytes Absolute: 1.3 10*3/uL — ABNORMAL HIGH (ref 0.1–1.0)
Monocytes Relative: 12 %
Neutro Abs: 6.7 10*3/uL (ref 1.7–7.7)
Neutrophils Relative %: 60 %
Platelets: 253 10*3/uL (ref 150–400)
RBC: 3.03 MIL/uL — ABNORMAL LOW (ref 3.87–5.11)
RDW: 13.6 % (ref 11.5–15.5)
WBC: 10.8 10*3/uL — ABNORMAL HIGH (ref 4.0–10.5)
nRBC: 0 % (ref 0.0–0.2)

## 2019-11-16 LAB — URINALYSIS, ROUTINE W REFLEX MICROSCOPIC
Bilirubin Urine: NEGATIVE
Glucose, UA: NEGATIVE mg/dL
Hgb urine dipstick: NEGATIVE
Ketones, ur: NEGATIVE mg/dL
Nitrite: NEGATIVE
Protein, ur: 30 mg/dL — AB
Specific Gravity, Urine: 1.019 (ref 1.005–1.030)
WBC, UA: >50 WBC/hpf — ABNORMAL HIGH (ref 0–5)
pH: 5 (ref 5.0–8.0)

## 2019-11-16 LAB — LIPASE, BLOOD: Lipase: 18 U/L (ref 11–51)

## 2019-11-16 LAB — BRAIN NATRIURETIC PEPTIDE: B Natriuretic Peptide: 263 pg/mL — ABNORMAL HIGH (ref 0.0–100.0)

## 2019-11-16 LAB — CBG MONITORING, ED: Glucose-Capillary: 174 mg/dL — ABNORMAL HIGH (ref 70–99)

## 2019-11-16 LAB — TROPONIN I (HIGH SENSITIVITY)
Troponin I (High Sensitivity): 50 ng/L — ABNORMAL HIGH (ref <18)
Troponin I (High Sensitivity): 49 ng/L — ABNORMAL HIGH (ref <18)

## 2019-11-16 LAB — SARS CORONAVIRUS 2 BY RT PCR (HOSPITAL ORDER, PERFORMED IN ~~LOC~~ HOSPITAL LAB): SARS Coronavirus 2: NEGATIVE

## 2019-11-16 MED ORDER — AZITHROMYCIN 250 MG PO TABS
250.0000 mg | ORAL_TABLET | Freq: Every day | ORAL | 0 refills | Status: DC
Start: 2019-11-16 — End: 2019-12-24

## 2019-11-16 MED ORDER — SODIUM CHLORIDE 0.9 % IV SOLN
1.0000 g | Freq: Once | INTRAVENOUS | Status: AC
  Administered 2019-11-16: 1 g via INTRAVENOUS
  Filled 2019-11-16: qty 10

## 2019-11-16 MED ORDER — LORAZEPAM 2 MG/ML IJ SOLN
0.5000 mg | Freq: Once | INTRAMUSCULAR | Status: AC
  Administered 2019-11-16: 0.5 mg via INTRAVENOUS
  Filled 2019-11-16: qty 1

## 2019-11-16 MED ORDER — ACETAMINOPHEN 325 MG PO TABS
650.0000 mg | ORAL_TABLET | Freq: Once | ORAL | Status: AC
  Administered 2019-11-16: 650 mg via ORAL
  Filled 2019-11-16: qty 2

## 2019-11-16 MED ORDER — CEPHALEXIN 500 MG PO CAPS
500.0000 mg | ORAL_CAPSULE | Freq: Four times a day (QID) | ORAL | 0 refills | Status: DC
Start: 2019-11-16 — End: 2019-12-24

## 2019-11-16 MED ORDER — SODIUM CHLORIDE 0.9 % IV SOLN
500.0000 mg | INTRAVENOUS | Status: DC
  Administered 2019-11-16: 500 mg via INTRAVENOUS
  Filled 2019-11-16: qty 500

## 2019-11-16 MED ORDER — SODIUM CHLORIDE 0.9 % IV SOLN: INTRAVENOUS | Status: DC

## 2019-11-16 NOTE — ED Triage Notes (Signed)
Pt brought in by [facility] for c/o altered loc and weakness; daughter reported to ems pt has strong smelling urine and she has been confused x one day; cbg 231

## 2019-11-16 NOTE — ED Notes (Signed)
Pt. With CT. 

## 2019-11-16 NOTE — Discharge Instructions (Signed)
Take the antibiotic Keflex as directed for the urinary tract infection.  And also supplement that with the Zithromax in case there is pneumonia.  Covid testing is pending.  He can followed up on MyChart.  Return for any new or worse symptoms.  Would expect improvement over the next couple days.  Otherwise work-up without any significant findings.

## 2019-11-16 NOTE — ED Provider Notes (Signed)
[facility] EMERGENCY DEPARTMENT Provider Note   CSN: [id number] Arrival date & time: 11/16/19  1001     History Chief Complaint  Patient presents with  . Altered Mental Status    [name] is a 69 y.o. female.  Patient brought in by EMS.  Her daughter is with her.  Patient since Wednesday has had little bit of confusion some generalized weakness.  Urine is been strong smelling.  Confusion little bit worse today there may have been a little bit of slurred speech today.  Patient has had both Covid vaccines.  Second 1 was in February.  Patient also has a history of atrial fibrillation is on Eliquis.  Normally on 2 L of nasal cannula oxygen at all times.  There have been no falls or injuries.  Cough or upper respiratory symptoms.        Past Medical History:  Diagnosis Date  . Abnormal pulmonary function test   . Anemia    H/H of 10/30 with a normal MCV in 12/09  . Anxiety   . Arthritis   . Barrett's esophagus    Diagnosed 1995. Last EGD 2016-NO BARRETT'S.   . Chest pain    Negative cardiac catheterization in 2002; negative stress nuclear study in 2008  . Chronic anticoagulation   . Chronic combined systolic and diastolic CHF (congestive heart failure) (HCC)    a. EF predominantly normal during prior echoes but was 40% during 10/2014 echo. b. Most recent 01/2015 EF normal, 55-60%.  . Chronic LBP    Surgical intervention in 1996  . Diabetes mellitus, type 2 (HCC)    Insulin therapy; exacerbated by prednisone  . Dysrhythmia    AFib  . Gastroparesis    99% retention 05/2008 on GES  . GERD (gastroesophageal reflux disease)   . Hiatal hernia   . Hyperlipidemia   . Hypertension   . Hypothyroid   . IBS (irritable bowel syndrome)   . Obesity   . OSA on CPAP    had CPAP and cannot tolerate.  . Paroxysmal atrial fibrillation ([place])   . Pulmonary hypertension ([place]) 01/2015   a. Predominantly pulmonary venous hypertension but may be component of PAH.  . Seizures ([place])    last seizure was 2 years ago; on keppra for this; unknown etiology  . Syncope    a. Admitted 05/2009; magnetic resonance imagin/ MRA - negative; etiology thought to be orthostasis secondary to drugs and dehydration. b. Syncope 02/2015 also felt 2/2 dehydration.    Patient Active Problem List   Diagnosis Date Noted  . Depression 08/31/2018  . Anxiety 08/31/2018  . Chronic respiratory failure with hypoxia and hypercapnia ([place]) 12/13/2017  . Acute systolic HF (heart failure) ([place]) 11/26/2017  . Palliative care by specialist   . Goals of care, counseling/discussion   . Acute on chronic diastolic CHF (congestive heart failure) ([place]) 08/17/2017  . Acute respiratory failure with hypoxia and hypercapnia ([place]) 08/16/2017  . Uncontrolled type 2 diabetes mellitus with hyperglycemia ([place]) 08/16/2017  . Acute respiratory failure with hypercapnia ([place])   . CAP (community acquired pneumonia) 08/05/2017  . COPD exacerbation ([place]) 08/05/2017  . Pulmonary hypertension ([place]) 04/05/2017  . Derangement of posterior horn of medial meniscus of right knee   . Lateral meniscus, anterior horn derangement, right   . Syncope, near 12/26/2015  . Fall at home 12/26/2015  . Dysphagia 12/07/2015  . IDA (iron deficiency anemia) 03/13/2015  . Chronic diastolic CHF (congestive heart failure) ([place]) 02/20/2015  . Diffuse  abdominal pain 09/16/2014  . Ascites 08/27/2014  . Anemia 07/20/2014  . Epistaxis, recurrent 07/20/2014  . Dementia (HCC) 04/11/2014  . Chronic diarrhea 04/11/2014  . DM type 2 (diabetes mellitus, type 2) (HCC) 04/11/2014  . Protein-calorie malnutrition, severe (HCC) 04/11/2014  . Obesity 12/03/2013  . Abnormal weight loss 08/26/2013  . Lower abdominal pain 08/26/2013  . Anorexia nervosa 08/26/2013  . Encounter for therapeutic drug monitoring 08/26/2013  . DOE (dyspnea on exertion) 06/29/2013  . Tremor 10/27/2012  . Chronic anticoagulation 06/23/2011  . HYPOTENSION, ORTHOSTATIC 08/26/2009  .  Hyperlipidemia 03/04/2009  . Barrett's esophagus 09/26/2008  . Gastroparesis 09/26/2008  . Iron deficiency anemia 09/25/2008  . Essential hypertension 09/25/2008  . LUNG NODULE 09/25/2008  . Irritable bowel syndrome 09/25/2008  . Sleep apnea 09/25/2008  . CARPAL TUNNEL SYNDROME, HX OF 09/25/2008  . Hypothyroidism 04/10/2008  . OBESITY 04/10/2008  . Atrial fibrillation (HCC) 04/10/2008  . Congestive heart failure (HCC) 04/10/2008  . LOW BACK PAIN, CHRONIC 04/10/2008  . CHEST PAIN-PRECORDIAL 04/10/2008    Past Surgical History:  Procedure Laterality Date  . BACK SURGERY  1996  . BIOPSY N/A 11/08/2013   Procedure: BIOPSY  / Tissue sampling / ulcers present in small intestine;  Surgeon: West Bali, MD;  Location[facility] ENDO [facility];  Service: Endoscopy;  Laterality: N/A;  . CARDIAC CATHETERIZATION  2002  . CARDIAC CATHETERIZATION N/A 01/26/2015   Procedure: Right Heart Cath;  Surgeon: Laurey Morale, MD;  Location: [facility] INVASIVE CV LAB;  Service: Cardiovascular;  Laterality: N/A;  . CARDIOVASCULAR STRESS TEST  2008   Stress nuclear study  . CARDIOVERSION N/A 03/06/2015   Procedure: CARDIOVERSION;  Surgeon: Laurey Morale, MD;  Location: [facility] ENDOSCOPY;  Service: Cardiovascular;  Laterality: N/A;  . CARPAL TUNNEL RELEASE  1994  . COLONOSCOPY  11/2011   Dr. Darrick Penna: Internal hemorrhoids, mild diverticulosis. Random colon biopsies negative.  . COLONOSCOPY N/A 11/08/2013   SLF: Normal mucosa in the terminal ileum/The colon IS redundant/  Moderate diverticulosis throughout the entire colon. ileum bx benign. colon bx benign  . ESOPHAGOGASTRODUODENOSCOPY  2008   Barrett's without dysplasia. esphagus dilated. antral erosions, h.pylori serologies negative.  . ESOPHAGOGASTRODUODENOSCOPY  11/2011   Dr. Darrick Penna: Barrett's esophagus, mild gastritis, diverticulum in the second portion of the duodenum repeat EGD 3 years. Small bowel biopsies negative. Gastric biopsy show reactive gastropathy but no H. pylori.  Esophageal biopsies consistent with GERD. Next EGD 11/2014  . ESOPHAGOGASTRODUODENOSCOPY N/A 11/21/2014   TRZ:NBVA non-erosive gastritis/irregular z-line  . GIVENS CAPSULE STUDY  12/07/2011   Proximal small bowel, rare AVM. Distal small bowel, multiple ulcers noted  . GIVENS CAPSULE STUDY N/A 09/27/2013   Distal small bowel ulcers extending to TI.  Marland Kitchen GIVENS CAPSULE STUDY N/A 10/10/2013   Procedure: GIVENS CAPSULE STUDY;  Surgeon: West Bali, MD;  Location: [facility] ENDO SUITE;  Service: Endoscopy;  Laterality: N/A;  7:30  . IR KYPHO THORACIC WITH BONE BIOPSY  02/09/2018  . KNEE ARTHROSCOPY WITH MEDIAL MENISECTOMY Right 06/09/2016   Procedure: KNEE ARTHROSCOPY WITH MEDIAL MENISECTOMY;  Surgeon: Vickki Hearing, MD;  Location: [facility] ORS;  Service: Orthopedics;  Laterality: Right;  medial and lateral menisectomy  . LAMINECTOMY  1995   L4-L5  . LAPAROSCOPIC CHOLECYSTECTOMY  1990s  . LEFT HEART CATHETERIZATION WITH CORONARY ANGIOGRAM  01/10/2014   Procedure: LEFT HEART CATHETERIZATION WITH CORONARY ANGIOGRAM;  Surgeon: Lesleigh Noe, MD;  Location: [facility] CATH LAB;  Service: Cardiovascular;;  . RIGHT HEART CATHETERIZATION N/A  01/10/2014   Procedure: RIGHT HEART CATH;  Surgeon: Lesleigh Noe, MD;  Location: [facility] CATH LAB;  Service: Cardiovascular;  Laterality: N/A;  . TOTAL ABDOMINAL HYSTERECTOMY  1999  . TRACHEOSTOMY TUBE PLACEMENT N/A 09/01/2017   Procedure: TRACHEOSTOMY;  Surgeon: Drema Halon, MD;  [facility];  Service: ENT;  Laterality: N/A;     OB History    Gravida  2   Para  2   Term  2   Preterm      AB      Living        SAB      TAB      Ectopic      Multiple      Live Births              Family History  Problem Relation Age of Onset  . Hypertension Mother   . Alzheimer's disease Mother   . Stroke Mother   . Heart attack Mother   . Hypertension Other   . Breast cancer Sister   . Heart disease Neg Hx   . Colon cancer Neg Hx     Social History    Tobacco Use  . Smoking status: Former Smoker    Packs/day: 0.25    Years: 15.00    Pack years: 3.75    Types: Cigarettes    Start date: 02/26/1966    Quit date: 07/01/1983    Years since quitting: 36.4  . Smokeless tobacco: Never Used  . Tobacco comment: quit in 1984  Substance Use Topics  . Alcohol use: No    Alcohol/week: 0.0 standard drinks  . Drug use: No    Home Medications Prior to Admission medications   Medication Sig Start Date End Date Taking? Authorizing Provider  acetaminophen (TYLENOL) 500 MG tablet Take 500 mg by mouth as needed for moderate pain or headache.    Yes [provider]  amiodarone (PACERONE) 200 MG tablet Take 1 tablet (200 mg total) by mouth every morning. 02/22/19  Yes Strader, [place] M, PA-C  apixaban (ELIQUIS) 5 MG TABS tablet Take 1 tablet (5 mg total) by mouth 2 (two) times daily. 02/22/19  Yes Strader, Lennart Pall, PA-C  Cholecalciferol (VITAMIN D3) 2000 units capsule Take 2,000 Units by mouth daily. 10/25/17  Yes [provider]  dicyclomine (BENTYL) 20 MG tablet 1 PO QAC TID Patient taking differently: Take 20 mg by mouth 3 (three) times daily before meals. 1 PO QAC TID 09/05/19  Yes Fields, Sandi L, MD  DULoxetine (CYMBALTA) 30 MG capsule Take 30 mg by mouth 2 (two) times daily. 11/24/17  Yes [provider]  gabapentin (NEURONTIN) 400 MG capsule Take by mouth 3 (three) times daily. Take 1 capsule(400mg ) 3 times daily. 07/18/17  Yes [provider]  glipiZIDE (GLUCOTROL) 10 MG tablet Take 10 mg by mouth 2 (two) times daily. 11/24/17  Yes [provider]  HYDROcodone-acetaminophen (NORCO) 10-325 MG tablet Take 1 tablet by mouth at bedtime. Takes 1/2 tablet at bedtime. 07/02/18  Yes [provider]  insulin glargine (LANTUS) 100 UNIT/ML injection Inject 0.55 mLs (55 Units total) into the skin at bedtime. 08/22/17  Yes Tat, Onalee Hua, MD  Lactase 9000 units CHEW 1 PO Q4H WHEN YOU EAT ICE CREAM OR  CHEESE Patient taking differently: Chew 1 tablet by mouth every 4 (four) hours. 1 PO Q4H WHEN YOU EAT ICE CREAM OR CHEESE 09/05/19  Yes Fields, Darleene Cleaver, MD  levETIRAcetam (KEPPRA) 1000 MG  tablet Take 1,500 mg by mouth 2 (two) times daily.    Yes [provider]  levothyroxine (SYNTHROID) 175 MCG tablet Take 1 tablet by mouth daily. 05/20/19  Yes [provider]  loperamide (IMODIUM) 2 MG capsule 1 PO Q4H PRN LOOSE STOOLS Patient taking differently: Take 4 mg by mouth every 4 (four) hours as needed. 1 PO Q4H PRN LOOSE STOOLS 09/05/19  Yes Fields, Sandi L, MD  LORazepam (ATIVAN) 1 MG tablet Take 0.5 mg by mouth at bedtime as needed for anxiety or sleep. for anxiety 11/24/17  Yes [provider]  magnesium oxide (MAG-OX) 400 MG tablet Take 1 tablet (400 mg total) by mouth 2 (two) times daily. 12/26/17  Yes Branch, Dorothe Pea, MD  metFORMIN (GLUCOPHAGE) 500 MG tablet Take 500 mg by mouth 2 (two) times daily with a meal.   Yes [provider]  Multiple Vitamin (MULTIVITAMIN WITH MINERALS) TABS tablet Take 1 tablet by mouth daily.   Yes [provider]  ondansetron (ZOFRAN) 4 MG tablet TAKE 1 TABLET BY MOUTH FOUR TIMES DAILY AS NEEDED FOR NAUSEA Patient taking differently: Take 4 mg by mouth 4 (four) times daily as needed for nausea.  01/10/18  Yes Gelene Mink, NP  OXYGEN 2 L daily.    Yes [provider]  pantoprazole (PROTONIX) 40 MG tablet TAKE ONE TABLET BY MOUTH TWICE DAILY BEFORE APPLY MEAL 02/07/17  Yes Gelene Mink, NP  potassium chloride (K-DUR) 10 MEQ tablet Take 2 tablets (20 mEq total) by mouth 2 (two) times daily. Patient taking differently: Take 20 mEq by mouth daily.  02/22/19  Yes Strader, Lennart Pall, PA-C  torsemide (DEMADEX) 20 MG tablet Take 40 mg in the AM Take additional 20 mg later in the day only if weight above 240 10/15/19  Yes Branch, Dorothe Pea, MD  albuterol (PROVENTIL) (2.5 MG/3ML) 0.083% nebulizer solution Take 3 mLs (2.5 mg  total) by nebulization every 6 (six) hours as needed for wheezing or shortness of breath. Patient taking differently: Take 2.5 mg by nebulization as needed for wheezing or shortness of breath.  09/02/18   Danford, Earl Lites, MD  azithromycin (ZITHROMAX) 250 MG tablet Take 1 tablet (250 mg total) by mouth daily. Take first 2 tablets together, then 1 every day until finished. 11/16/19   Vanetta Mulders, MD  cephALEXin (KEFLEX) 500 MG capsule Take 1 capsule (500 mg total) by mouth 4 (four) times daily. 11/16/19   Vanetta Mulders, MD  meclizine (ANTIVERT) 25 MG tablet Take 25 mg by mouth as needed.  05/21/18   [provider]  naproxen sodium (ALEVE) 220 MG tablet Take 440 mg by mouth as needed (pain).     [provider]    Allergies    Celexa [citalopram hydrobromide], Codeine, Dilaudid [hydromorphone hcl], Reglan [metoclopramide], and Hyoscyamine  Review of Systems   Review of Systems  Constitutional: Positive for fatigue. Negative for chills and fever.  HENT: Negative for congestion, rhinorrhea and sore throat.   Eyes: Negative for visual disturbance.  Respiratory: Negative for cough and shortness of breath.   Cardiovascular: Negative for chest pain and leg swelling.  Gastrointestinal: Negative for abdominal pain, diarrhea, nausea and vomiting.  Genitourinary: Positive for dysuria.  Musculoskeletal: Negative for back pain and neck pain.  Skin: Negative for rash.  Neurological: Positive for speech difficulty and weakness. Negative for dizziness, light-headedness and headaches.  Hematological: Does not bruise/bleed easily.  Psychiatric/Behavioral: Negative for confusion.    Physical Exam Updated Vital  Signs BP (!) 147/74   Pulse 82   Temp 99.3 F (37.4 C) (Oral)   Resp 16   Ht 1.727 m (5\' 8" )   Wt 106.1 kg   SpO2 92%   BMI 35.58 kg/m   Physical Exam Vitals and nursing note reviewed.  Constitutional:      General: She is not in acute distress.     Appearance: Normal appearance. She is well-developed.  HENT:     Head: Normocephalic and atraumatic.  Eyes:     Extraocular Movements: Extraocular movements intact.     Conjunctiva/sclera: Conjunctivae normal.     Pupils: Pupils are equal, round, and reactive to light.  Cardiovascular:     Rate and Rhythm: Normal rate and regular rhythm.     Heart sounds: No murmur.  Pulmonary:     Effort: Pulmonary effort is normal. No respiratory distress.     Breath sounds: Normal breath sounds.  Abdominal:     Palpations: Abdomen is soft.     Tenderness: There is no abdominal tenderness.  Musculoskeletal:        General: Normal range of motion.     Cervical back: Normal range of motion and neck supple.  Skin:    General: Skin is warm and dry.  Neurological:     General: No focal deficit present.     Mental Status: She is alert. Mental status is at baseline.     Cranial Nerves: No cranial nerve deficit.     Sensory: No sensory deficit.     Motor: No weakness.     ED Results / Procedures / Treatments   Labs (all labs ordered are listed, but only abnormal results are displayed) Labs Reviewed  URINALYSIS, ROUTINE W REFLEX MICROSCOPIC - Abnormal; Notable for the following components:      Result Value   Color, Urine AMBER (*)    APPearance HAZY (*)    Protein, ur 30 (*)    Leukocytes,Ua MODERATE (*)    WBC, UA >50 (*)    Bacteria, UA MANY (*)    All other components within normal limits  COMPREHENSIVE METABOLIC PANEL - Abnormal; Notable for the following components:   Chloride 97 (*)    Glucose, Bld 169 (*)    Creatinine, Ser 1.25 (*)    Calcium 8.7 (*)    Albumin 3.3 (*)    Total Bilirubin 0.1 (*)    GFR calc non Af Amer 44 (*)    GFR calc Af Amer 50 (*)    All other components within normal limits  CBC WITH DIFFERENTIAL/PLATELET - Abnormal; Notable for the following components:   WBC 10.8 (*)    RBC 3.03 (*)    Hemoglobin 9.9 (*)    HCT 32.4 (*)    MCV 106.9 (*)     Monocytes Absolute 1.3 (*)    All other components within normal limits  BRAIN NATRIURETIC PEPTIDE - Abnormal; Notable for the following components:   B Natriuretic Peptide 263.0 (*)    All other components within normal limits  CBG MONITORING, ED - Abnormal; Notable for the following components:   Glucose-Capillary 174 (*)    All other components within normal limits  TROPONIN I (HIGH SENSITIVITY) - Abnormal; Notable for the following components:   Troponin I (High Sensitivity) 50 (*)    All other components within normal limits  TROPONIN I (HIGH SENSITIVITY) - Abnormal; Notable for the following components:   Troponin I (High Sensitivity) 49 (*)  All other components within normal limits  URINE CULTURE  SARS CORONAVIRUS 2 BY RT PCR [facility] ORDER, PERFORMED IN [facility] LAB)  LIPASE, BLOOD    EKG EKG Interpretation  Date/Time:  Saturday Nov 16 2019 10:15:15 EDT Ventricular Rate:  80 PR Interval:    QRS Duration: 120 QT Interval:  461 QTC Calculation: 532 R Axis:   -56 Text Interpretation: Sinus rhythm Left anterior fascicular block Probable lateral infarct, age indeterminate ST elevation, consider inferior injury Baseline wander in lead(s) II aVR aVF V6 Confirmed by Vanetta Mulders [phone]) on 11/16/2019 10:21:50 AM   Radiology CT Head Wo Contrast  Result Date: 11/16/2019 CLINICAL DATA:  Altered mental status and weakness EXAM: CT HEAD WITHOUT CONTRAST TECHNIQUE: Contiguous axial images were obtained from the base of the skull through the vertex without intravenous contrast. COMPARISON:  12/25/2015 FINDINGS: Brain: No evidence of acute infarction, hemorrhage, hydrocephalus, extra-axial collection or mass lesion/mass effect. Mild atrophic and chronic white matter ischemic changes are noted. Vascular: No hyperdense vessel or unexpected calcification. Skull: Normal. Negative for fracture or focal lesion. Sinuses/Orbits: No acute finding. Other: None. IMPRESSION: Chronic  atrophic and ischemic changes without acute abnormality. Electronically Signed   By: Alcide Clever M.D.   On: 11/16/2019 11:53   CT Chest Wo Contrast  Result Date: 11/16/2019 CLINICAL DATA:  Pneumonia. Altered level of consciousness and weakness. EXAM: CT CHEST WITHOUT CONTRAST TECHNIQUE: Multidetector CT imaging of the chest was performed following the standard protocol without IV contrast. COMPARISON:  11/26/2017 FINDINGS: Cardiovascular: Moderate enlargement of the heart. No pericardial effusion. No coronary artery calcifications. Aorta is normal in caliber. Mild aortic atherosclerotic calcification along the arch. Enlarged pulmonary arteries, right 3.1 cm, left 3.2 cm. Mediastinum/Nodes: No neck base, mediastinal or hilar masses. No discrete enlarged lymph nodes. Trachea and esophagus are unremarkable. Lungs/Pleura: Bronchial wall thickening and peribronchovascular opacity is noted in the lower lobes and, to lesser degree, right middle lobe and left upper lobe lingula. There are additional areas of linear/discoid opacity bilaterally that are consistent with atelectasis. Bilateral interstitial thickening is noted. There is mild hazy ground-glass opacity most evident in the right upper lobe. No pleural effusion.  No pneumothorax. Upper Abdomen: No acute abnormality. Musculoskeletal: Ununited fracture of the upper sternum at the level of the sternal manubrial joint, new since the prior CT. Old mild compression fracture of T4 and old fracture of L1. No other fractures. No osteoblastic or osteolytic lesions. IMPRESSION: 1. Lungs demonstrate patchy peribronchovascular and discoid type opacities that may all be due to atelectasis. Multifocal pneumonia is in the differential. There also hazy ground-glass opacities that are most evident in the right upper lobe, which could be due to infection or inflammation or be due to shunting related to airways disease. 2. Lungs also demonstrate bilateral interstitial thickening,  mild in similar to the prior CT. No overt pulmonary edema. Overall appearance of the lungs is similar to the prior chest CT. No evidence of an abscess and no pleural effusion. 3. Moderate cardiomegaly, stable. 4. Enlarged pulmonary arteries consistent with pulmonary artery hypertension, also unchanged. Aortic Atherosclerosis (ICD10-I70.0). Electronically Signed   By: Amie Portland M.D.   On: 11/16/2019 13:16   DG Chest Port 1 View  Result Date: 11/16/2019 CLINICAL DATA:  Confusion, altered mental status EXAM: PORTABLE CHEST 1 VIEW COMPARISON:  08/31/2018 FINDINGS: Cardiomegaly with increased vascular congestion/mild interstitial edema pattern. Slightly lower lung volumes. Left mid and lower lung airspace opacity obscures the left cardiac border and left hemidiaphragm. No  large effusion or pneumothorax. Aorta atherosclerotic. Degenerative changes of the spine IMPRESSION: Cardiomegaly with increased vascular congestion versus developing edema Left mid and lower lung airspace opacity/consolidation. Difficult to exclude pneumonia. Electronically Signed   By: Judie Petit.  Shick M.D.   On: 11/16/2019 11:23    Procedures Procedures (including critical care time)  Medications Ordered in ED Medications  0.9 %  sodium chloride infusion ( Intravenous New Bag/Given 11/16/19 1205)  cefTRIAXone (ROCEPHIN) 1 g in sodium chloride 0.9 % 100 mL IVPB (1 g Intravenous New Bag/Given 11/16/19 1414)  azithromycin (ZITHROMAX) 500 mg in sodium chloride 0.9 % 250 mL IVPB (has no administration in time range)  acetaminophen (TYLENOL) tablet 650 mg (650 mg Oral Given 11/16/19 1241)    ED Course  I have reviewed the triage vital signs and the nursing notes.  Pertinent labs & imaging results that were available during my care of the patient were reviewed by me and considered in my medical decision making (see chart for details).    MDM Rules/Calculators/A&P                      Extensive work-up.  Urine does appear infected.  It  took a while to get that.  Chest x-ray raise some concerns for atypical pneumonia.  CT chest chest sort of came to the same conclusion.  Maybe a little more extensive on the right side.  Patient started on Rocephin and Zithromax IV for that.  Then the urine came back.  Covid testing was ordered because of questionable atypical that is still pending but patient's had both vaccines and had the second 1 the end of February so unlikely she would have a significant Covid infection.  Vital signs here temp 99.3 so not a true fever but a little elevated.  Patient after hydration became much more alert.  CT head was negative troponins x2 without any significant changes.  Initial troponin was done because EKG had some questionable ST segment changes inferiorly.  Patient did not have any chest pain.  So patient will can finish the Rocephin and Zithromax here she will continue Keflex at home urine was sent for culture.  And will finish out a Z-Pak.  And Covid testing is pending.  Could be back before patient is ready for discharge.  But patient's not hypoxic.  And would not require admission even if the Covid positive.  Final Clinical Impression(s) / ED Diagnoses Final diagnoses:  Community acquired pneumonia, unspecified laterality  Acute cystitis without hematuria    Rx / DC Orders ED Discharge Orders         Ordered    cephALEXin (KEFLEX) 500 MG capsule  4 times daily     11/16/19 1441    azithromycin (ZITHROMAX) 250 MG tablet  Daily     11/16/19 1441           Vanetta Mulders, MD 11/16/19 1446

## 2019-11-16 NOTE — ED Notes (Addendum)
Patient initial oxygen saturation 80% on 2L. Inetta Fermo RN aware.

## 2019-11-19 LAB — URINE CULTURE: Culture: >=100000 — AB

## 2019-11-20 ENCOUNTER — Telehealth: Payer: Self-pay | Admitting: *Deleted

## 2019-11-20 NOTE — Telephone Encounter (Signed)
Post ED Visit - Positive Culture Follow-up  Culture report reviewed by antimicrobial stewardship pharmacist: [facility] []  , Pharm.D. []  Enzo Bi, Pharm.D., BCPS AQ-ID []  , Pharm.D., BCPS []  Celedonio Miyamoto, Pharm.D., BCPS []  [place], Garvin Fila.D., BCPS, AAHIVP []  , Pharm.D., BCPS, AAHIVP []  Georgina Pillion, PharmD, BCPS []  , PharmD, BCPS []  [place], PharmD, BCPS []  [address], PharmD []  , PharmD, BCPS []  Estella Husk, PharmD , PharmD  [facility] []  , PharmD []  Phillips Climes, PharmD []  , PharmD []  Agapito Games, Rph []  ) Verlan Friends, PharmD []  , PharmD []  Mervyn Gay, PharmD []  , PharmD []  Vinnie Level, PharmD []  Jettie Pagan, PharmD []  [facility], PharmD []  , PharmD []  Len Childs, PharmD   Positive urine culture Treated with Cephalexin, organism sensitive to the same and no further patient follow-up is required at this time.  [facility] 11/20/2019, 10:36 AM

## 2019-11-27 ENCOUNTER — Telehealth: Payer: PPO | Admitting: Cardiology

## 2019-11-27 NOTE — Progress Notes (Unsigned)
{Choose 1 Note Type (Video or Telephone):[phone]}   The patient was identified using 2 identifiers.  Date:  11/27/2019   ID:  Tara, [name] [birth year], MRN [id number]  {Patient [phone]::"Home"} {Provider Location:[phone]::"Home"}  PCP:  Lemmie Evens, MD  Cardiologist:  Carlyle Dolly, MD *** Electrophysiologist:  None   Evaluation Performed:  {Choose Visit Type:[phone]::"Follow-Up Visit"}  Chief Complaint:  ***  History of Present Illness:    [name] is a 69 y.o. female seen today for follow up of the following medical problems.   1. Chronicsystolic/ diastolic CHF with mixed precap and post postcap Pulmonary Hypertension - echo 12/2013 PASP 85, moderate TR, RV mild to moderately dilated with normal function, could not eval diasotlic function due to afib, LVEF 50-55%. Biatrial enlargement.  - RHC 01/2015 PA 38/13 and calculated mean of 21, could not get a wedge however LVEDP 15. - repeat echo 10/2014 PASP 61 mmHg (down from 85 on prior echo), some evidence of RV dysfunction with RV TAPSE 1.4 and tissue anular systolic velocity of 7.5. Cannot evaluate diastolic function, however severe biatrial enlarement suggests significant dysfunction, - 01/2015 RHC mean PA 52, PCWP 31 - 01/2015 echo LVEF 55-60%, PASP 46  - after most recent RHC in 01/2015 she was admitted for diuresis. Discharge weight 170 lbs, reportedly down from 190 on admission. Marland Kitchen Discharged on lasix 60mg  bid.   -admit 11/2017 to [facility] with volume overload. Diuresed 1.4 L, weight 237 to 229 lbs. Cr was stable. Discharged on torsemide 40mg  in AM and 20mg  in PM - 11/2017 echo LVEF 60-65%, no WMAs, grade I diastolic dysfunction, PASP 46. - since discharge doing well. Swelling is improving in legs.Home weights 229 at [place], up to [place] weights up 245 lbs, now down to 240 lbs. Swelling in legs improving. Taking toresmide 40mg  in AM and 20mg  in PM. She associates weight gain to inactivity  due to recent back injury. Gradual increase of weight she reports.  - BNP lower than baseline, does not appear volume overloaded by exam.  - weights today 234 lbs -    2. Afib - amio started 02/2015.  - has had severe diffusion dfect on PFTs 10/2016prior  - no recent palpitations - no bleeding on eliquis.     3. OSA - could not tolerate CPAP  4. AMS -ER visit 11/16/19, found to have UTI. CXR suspected atypical pneumonia - Cr elevated to 1.25 in ER.    SH: completed her covid vaccine x2.   The patient {does/does not:200015} have symptoms concerning for COVID-19 infection (fever, chills, cough, or new shortness of breath).    Past Medical History:  Diagnosis Date  . Abnormal pulmonary function test   . Anemia    H/H of 10/30 with a normal MCV in 12/09  . Anxiety   . Arthritis   . Barrett's esophagus    Diagnosed 1995. Last EGD 2016-NO BARRETT'S.   . Chest pain    Negative cardiac catheterization in 2002; negative stress nuclear study in 2008  . Chronic anticoagulation   . Chronic combined systolic and diastolic CHF (congestive heart failure) (HCC)    a. EF predominantly normal during prior echoes but was 40% during 10/2014 echo. b. Most recent 01/2015 EF normal, 55-60%.  . Chronic LBP    Surgical intervention in 1996  . Diabetes mellitus, type 2 (HCC)    Insulin therapy; exacerbated by prednisone  . Dysrhythmia    AFib  . Gastroparesis    99% retention  05/2008 on GES  . GERD (gastroesophageal reflux disease)   . Hiatal hernia   . Hyperlipidemia   . Hypertension   . Hypothyroid   . IBS (irritable bowel syndrome)   . Obesity   . OSA on CPAP    had CPAP and cannot tolerate.  . Paroxysmal atrial fibrillation (HCC)   . Pulmonary hypertension (HCC) 01/2015   a. Predominantly pulmonary venous hypertension but may be component of PAH.  . Seizures (HCC)    last seizure was 2 years ago; on keppra for this; unknown etiology  . Syncope    a. Admitted  05/2009; magnetic resonance imagin/ MRA - negative; etiology thought to be orthostasis secondary to drugs and dehydration. b. Syncope 02/2015 also felt 2/2 dehydration.   Past Surgical History:  Procedure Laterality Date  . BACK SURGERY  1996  . BIOPSY N/A 11/08/2013   Procedure: BIOPSY  / Tissue sampling / ulcers present in small intestine;  Surgeon: West Bali, MD;  Location: [facility];  Service: Endoscopy;  Laterality: N/A;  . CARDIAC CATHETERIZATION  2002  . CARDIAC CATHETERIZATION N/A 01/26/2015   Procedure: Right Heart Cath;  Surgeon: Laurey Morale, MD;  Location: [facility] INVASIVE CV LAB;  Service: Cardiovascular;  Laterality: N/A;  . CARDIOVASCULAR STRESS TEST  2008   Stress nuclear study  . CARDIOVERSION N/A 03/06/2015   Procedure: CARDIOVERSION;  Surgeon: Laurey Morale, MD;  Location: [facility] ENDOSCOPY;  Service: Cardiovascular;  Laterality: N/A;  . CARPAL TUNNEL RELEASE  1994  . COLONOSCOPY  11/2011   Dr. Darrick Penna: Internal hemorrhoids, mild diverticulosis. Random colon biopsies negative.  . COLONOSCOPY N/A 11/08/2013   SLF: Normal mucosa in the terminal ileum/The colon IS redundant/  Moderate diverticulosis throughout the entire colon. ileum bx benign. colon bx benign  . ESOPHAGOGASTRODUODENOSCOPY  2008   Barrett's without dysplasia. esphagus dilated. antral erosions, h.pylori serologies negative.  . ESOPHAGOGASTRODUODENOSCOPY  11/2011   Dr. Darrick Penna: Barrett's esophagus, mild gastritis, diverticulum in the second portion of the duodenum repeat EGD 3 years. Small bowel biopsies negative. Gastric biopsy show reactive gastropathy but no H. pylori. Esophageal biopsies consistent with GERD. Next EGD 11/2014  . ESOPHAGOGASTRODUODENOSCOPY N/A 11/21/2014   UTM:LYYT non-erosive gastritis/irregular z-line  . GIVENS CAPSULE STUDY  12/07/2011   Proximal small bowel, rare AVM. Distal small bowel, multiple ulcers noted  . GIVENS CAPSULE STUDY N/A 09/27/2013   Distal small bowel ulcers extending to TI.  Marland Kitchen  GIVENS CAPSULE STUDY N/A 10/10/2013   Procedure: GIVENS CAPSULE STUDY;  Surgeon: West Bali, MD;  Location: [facility];  Service: Endoscopy;  Laterality: N/A;  7:30  . IR KYPHO THORACIC WITH BONE BIOPSY  02/09/2018  . KNEE ARTHROSCOPY WITH MEDIAL MENISECTOMY Right 06/09/2016   Procedure: KNEE ARTHROSCOPY WITH MEDIAL MENISECTOMY;  Surgeon: Vickki Hearing, MD;  Location: [facility] ORS;  Service: Orthopedics;  Laterality: Right;  medial and lateral menisectomy  . LAMINECTOMY  1995   L4-L5  . LAPAROSCOPIC CHOLECYSTECTOMY  1990s  . LEFT HEART CATHETERIZATION WITH CORONARY ANGIOGRAM  01/10/2014   Procedure: LEFT HEART CATHETERIZATION WITH CORONARY ANGIOGRAM;  Surgeon: Lesleigh Noe, MD;  Location: [facility] CATH LAB;  Service: Cardiovascular;;  . RIGHT HEART CATHETERIZATION N/A 01/10/2014   Procedure: RIGHT HEART CATH;  Surgeon: Lesleigh Noe, MD;  Location: [facility] CATH LAB;  Service: Cardiovascular;  Laterality: N/A;  . TOTAL ABDOMINAL HYSTERECTOMY  1999  . TRACHEOSTOMY TUBE PLACEMENT N/A 09/01/2017   Procedure: TRACHEOSTOMY;  Surgeon: Drema Halon,  MD;  Location: [facility] OR;  Service: ENT;  Laterality: N/A;     No outpatient medications have been marked as taking for the 11/27/19 encounter (Appointment) with Antoine Poche, MD.     Allergies:   Celexa [citalopram hydrobromide], Codeine, Dilaudid [hydromorphone hcl], Reglan [metoclopramide], and Hyoscyamine   Social History   Tobacco Use  . Smoking status: Former Smoker    Packs/day: 0.25    Years: 15.00    Pack years: 3.75    Types: Cigarettes    Start date: 02/26/1966    Quit date: 07/01/1983    Years since quitting: 36.4  . Smokeless tobacco: Never Used  . Tobacco comment: quit in 1984  Substance Use Topics  . Alcohol use: No    Alcohol/week: 0.0 standard drinks  . Drug use: No     Family Hx: The patient's family history includes Alzheimer's disease in her mother; Breast cancer in her sister; Heart attack in her mother;  Hypertension in her mother and another family member; Stroke in her mother. There is no history of Heart disease or Colon cancer.  ROS:   Please see the history of present illness.    *** All other systems reviewed and are negative.   Prior CV studies:   The following studies were reviewed today:  12/2013 Echo Study Conclusions  - Limited study to evaluate LV systolic function. - Left ventricle: The cavity size was normal. Wall thickness was normal. Systolic function was normal. The estimated ejection fraction was in the range of 50% to 55%. Wall motion was normal; there were no regional wall motion abnormalities. The study was not technically sufficient to allow evaluation of LV diastolic dysfunction due to atrial fibrillation. - Aortic valve: Valve area (VTI): 2.49 cm^2. Valve area (Vmax): 2.11 cm^2. - Mitral valve: There was moderate regurgitation. The MR vena contracta is 0.4 cm. - Left atrium: The atrium was severely dilated. - Right ventricle: The cavity size was mildly to moderately dilated. - Right atrium: The atrium was severely dilated. - Atrial septum: From available images no evidence of ASD or PFO by 2D or color Doppler images. - Tricuspid valve: There was moderate regurgitation. - Pulmonary arteries: Systolic pressure was severely increased. PA peak pressure: 85 mm Hg (S).  01/2014 RHC HEMODYNAMICS: Aortic pressure 123/73 mmHg; LV pressure 122/6 mmHg; LVEDP 15 mm mercury; RA 4 mmHg; RV 31/5; PA 38/13 mmHg; PCWP(mean) unable to wedge the balloon tip catheter; Cardiac Output by Fick 4.73 L per minute  ANGIOGRAPHIC DATA: The left main coronary artery is normal.  The left anterior descending artery is widely patent, traverses the LV apex, gives origin to 2 diagonals, and has no significant obstruction..  The left circumflex artery is widely patent and gives a single large obtuse marginal Danta Baumgardner that trifurcates on the lateral wall. No  significant obstruction.  The right coronary artery is dominant, gives 3 left ventricular branches and a large bifurcating PDA. No obstructions noted.Marland Kitchen  LEFT VENTRICULOGRAM: Left ventricular angiogram was done in the 30 RAO projection and revealed normal LV cavity size, tachycardia due to atrial fibrillation, and mild global reduction in LV function with an estimated ejection fraction of 40%   IMPRESSIONS: 1. Widely patent coronary arteries 2. Mild pulmonary hypertension 3. Mildly depressed LV systolic function with an estimated EF of 40% 3. This did not confirm significant pulmonary hypertension. The procedure did suggest that the right atrium and right ventricle were enlarged as we had difficulty manipulating the right heart catheter into the  pulmonary artery.   RECOMMENDATION: Back to the outpatient area at home later today if no complications. She should not resume eloquence until 24 hours post procedure..  10/2014 Echo Study Conclusions  - Left ventricle: The cavity size was normal. Wall thickness was increased in a pattern of mild LVH. There was mild concentric hypertrophy. The estimated ejection fraction was 40%. Diffuse hypokinesis. - Mitral valve: Calcified annulus. There was mild regurgitation. - Left atrium: The atrium was severely dilated. - Right atrium: The atrium was severely dilated. - Atrial septum: Thinned and stretched but no obvious PFO. No defect or patent foramen ovale was identified. - Tricuspid valve: There was moderate regurgitation. - Impressions: E/E&' not reported and in afib diastolic evaluation not possible.  Impressions:  - E/E&' not reported and in afib diastolic evaluation not possible.   01/2015 RHC    1. Severely elevated right and left heart filling pressures.  2. Primarily pulmonary venous hypertension (may be additional component of PAH with PVR 4.6 WU).   Plan:  I am going to admit her for IV diuresis.       Right Heart Pressures RHC Procedural Findings: Hemodynamics (mmHg) RA mean 23 RV 68/14 PA 70/36, mean 52 PCWP mean 31  Oxygen saturations: PA 60% AO 98%  Cardiac Output (Fick) 4.54  Cardiac Index (Fick) 2.32  PVR 4.6 WU    11/2017 echo Study Conclusions  - Left ventricle: The cavity size was normal. There was moderate concentric hypertrophy. Systolic function was normal. The estimated ejection fraction was in the range of 60% to 65%. Wall motion was normal; there were no regional wall motion abnormalities. Doppler parameters are consistent with abnormal left ventricular relaxation (grade 1 diastolic dysfunction). - Aortic valve: Transvalvular velocity was within the normal range. There was no stenosis. There was trivial regurgitation. - Mitral valve: Transvalvular velocity was within the normal range. There was no evidence for stenosis. There was no regurgitation. - Left atrium: The atrium was severely dilated. - Right ventricle: The cavity size was normal. Wall thickness was normal. Systolic function was normal. - Tricuspid valve: There was mild regurgitation. - Pulmonary arteries: PA peak pressure: 46 mm Hg (S).   Labs/Other Tests and Data Reviewed:    EKG:  {EKG/Telemetry Strips Reviewed:[id number]}  Recent Labs: 11/16/2019: ALT 15; B Natriuretic Peptide 263.0; BUN 22; Creatinine, Ser 1.25; Hemoglobin 9.9; Platelets 253; Potassium 4.3; Sodium 138   Recent Lipid Panel Lab Results  Component Value Date/Time   CHOL 167 02/16/2018 10:43 AM   TRIG 179 (H) 02/16/2018 10:43 AM   HDL 38 (L) 02/16/2018 10:43 AM   CHOLHDL 4.4 02/16/2018 10:43 AM   LDLCALC 93 02/16/2018 10:43 AM    Wt Readings from Last 3 Encounters:  11/16/19 234 lb (106.1 kg)  10/15/19 234 lb (106.1 kg)  02/22/19 241 lb (109.3 kg)     Objective:    Vital Signs:  There were no vitals taken for this visit.   {[facility] Virtual Exam (Optional):[phone]::"VITAL SIGNS:   reviewed"}  ASSESSMENT & PLAN:    1.Chronic diastolic HF with mixed pulmonary HTN -weight down 6 lbs since our last visit. Denies any significant edema. Nonspecific symptoms of genrealized fatigue and dizziness, unclear if could be dehydration. She has had intermittent issues with diarrhea and N/V, remains on her home toresmide - change torsemide to 40mg  in AM, take additional 20mg  later in the day if weight above 240 lbs - f/u with pcp for generalized fatigue, no specific cardiopulmonary symptoms.   2. Afib -  no symptoms, conitnue current meds  COVID-19 Education: The signs and symptoms of COVID-19 were discussed with the patient and how to seek care for testing (follow up with PCP or arrange E-visit).  ***The importance of social distancing was discussed today.  Time:   Today, I have spent *** minutes with the patient with telehealth technology discussing the above problems.     Medication Adjustments/Labs and Tests Ordered: Current medicines are reviewed at length with the patient today.  Concerns regarding medicines are outlined above.   Tests Ordered: No orders of the defined types were placed in this encounter.   Medication Changes: No orders of the defined types were placed in this encounter.   Follow Up:  {F/U Format:[phone]} {follow up:15908}  Signed, Dina Rich, MD  11/27/2019 7:51 AM    [facility]

## 2019-11-28 ENCOUNTER — Other Ambulatory Visit: Payer: Self-pay | Admitting: Neurological Surgery

## 2019-11-28 DIAGNOSIS — M545 Low back pain: Secondary | ICD-10-CM | POA: Diagnosis not present

## 2019-11-29 ENCOUNTER — Other Ambulatory Visit: Payer: Self-pay | Admitting: Neurological Surgery

## 2019-11-29 DIAGNOSIS — M545 Low back pain, unspecified: Secondary | ICD-10-CM

## 2019-12-21 IMAGING — DX DG CHEST 2V
2 series · 2 of 2 positions shown · non-contrast
Comparison: September 30, 2016

CLINICAL DATA: Shortness of breath.  Cough.

EXAM:
CHEST  2 VIEW

[chest lat]
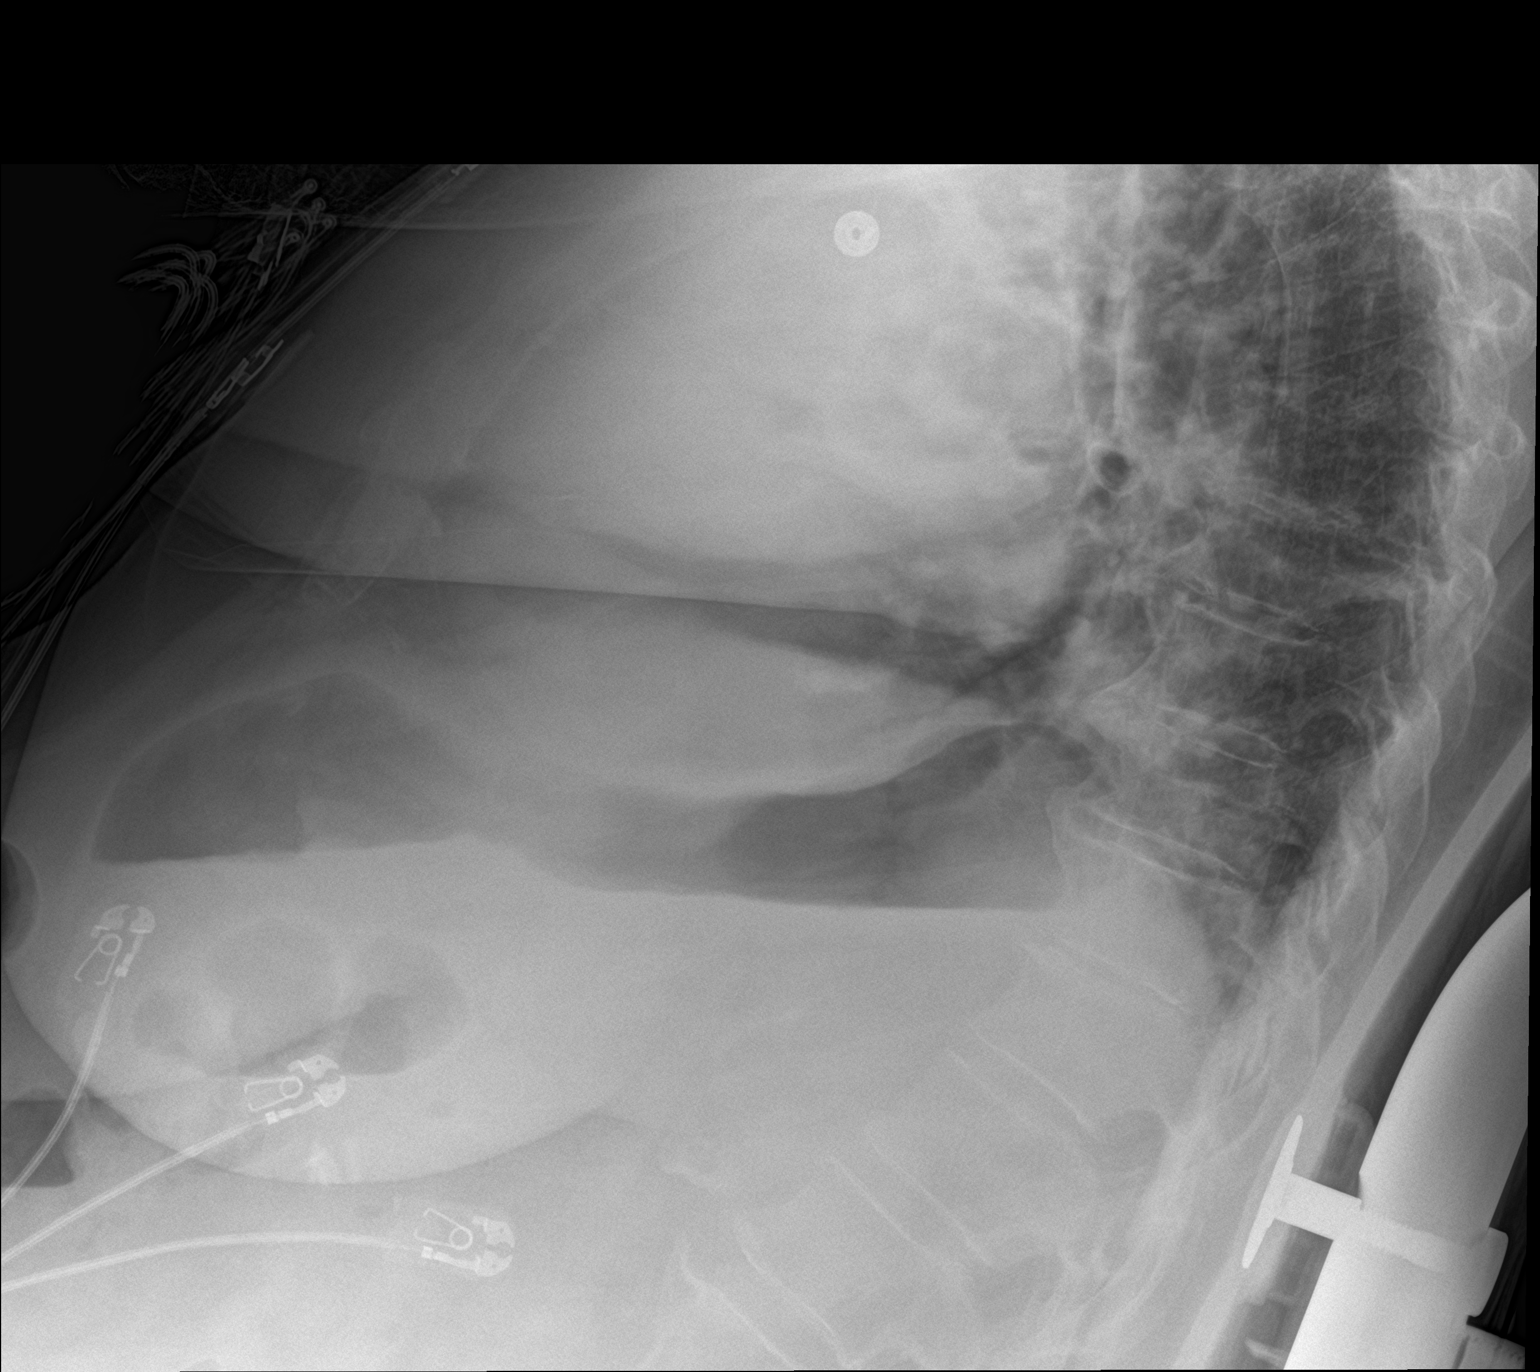

[chest ap]
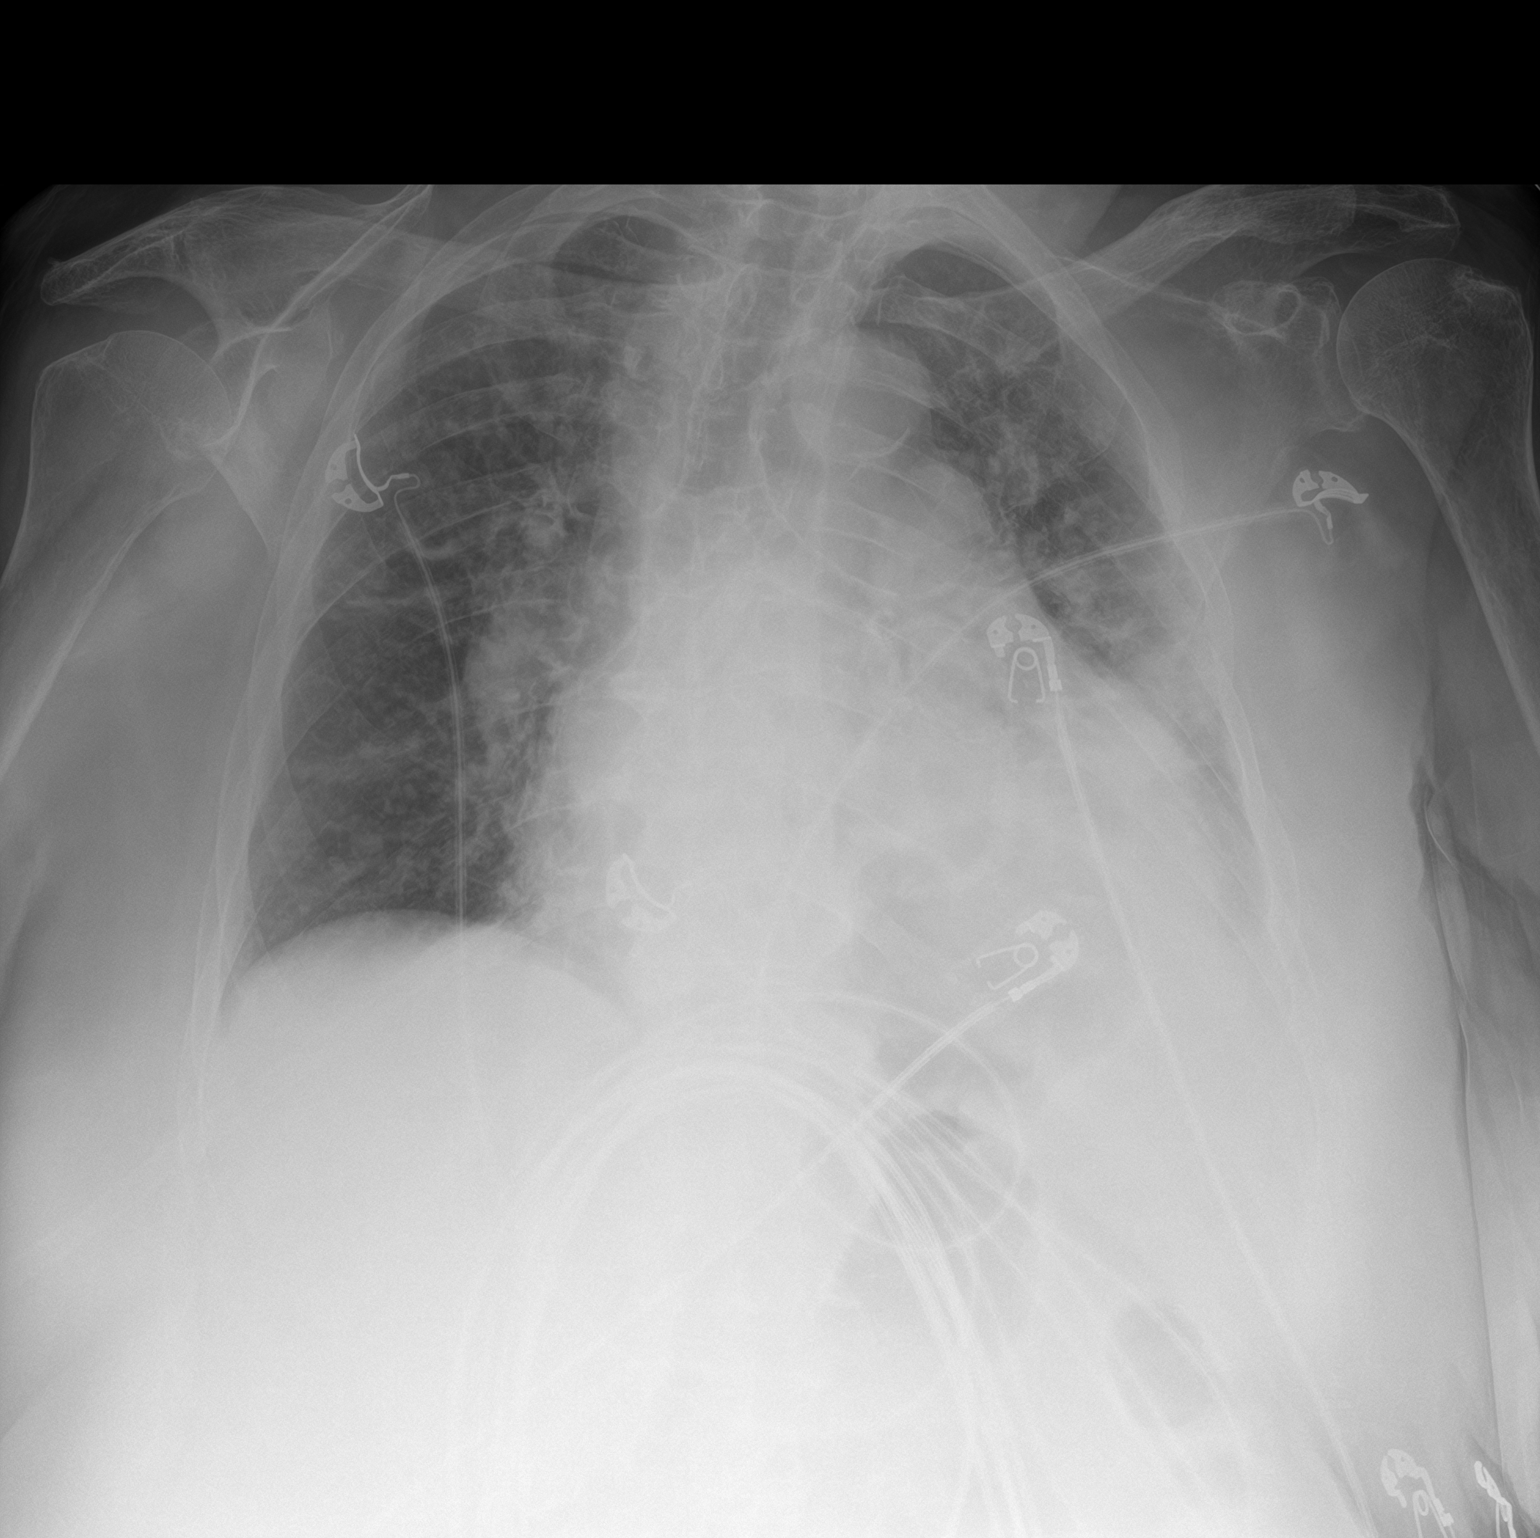

[2 of 2 positions shown; findings below may reference images not displayed]

FINDINGS: No pneumothorax. Stable cardiomegaly. The hila and mediastinum are
unchanged. There appears to be increased opacity in the left
retrocardiac region which could represent pneumonia given history.
Recommend follow-up to resolution.
IMPRESSION: Left retrocardiac opacity may represent pneumonia given history.
Recommend follow-up to resolution.

## 2019-12-24 ENCOUNTER — Telehealth (INDEPENDENT_AMBULATORY_CARE_PROVIDER_SITE_OTHER): Payer: PPO | Admitting: Cardiology

## 2019-12-24 ENCOUNTER — Encounter: Payer: Self-pay | Admitting: Cardiology

## 2019-12-24 VITALS — BP 105/85 | HR 82 | Ht 65.0 in | Wt 241.0 lb

## 2019-12-24 DIAGNOSIS — I480 Paroxysmal atrial fibrillation: Secondary | ICD-10-CM

## 2019-12-24 DIAGNOSIS — I5033 Acute on chronic diastolic (congestive) heart failure: Secondary | ICD-10-CM | POA: Diagnosis not present

## 2019-12-24 IMAGING — CR DG CHEST 1V PORT
1 series · 1 of 1 positions shown · non-contrast
Comparison: August 05, 2017

CLINICAL DATA: Respiratory failure.

EXAM:
PORTABLE CHEST 1 VIEW

[portable]
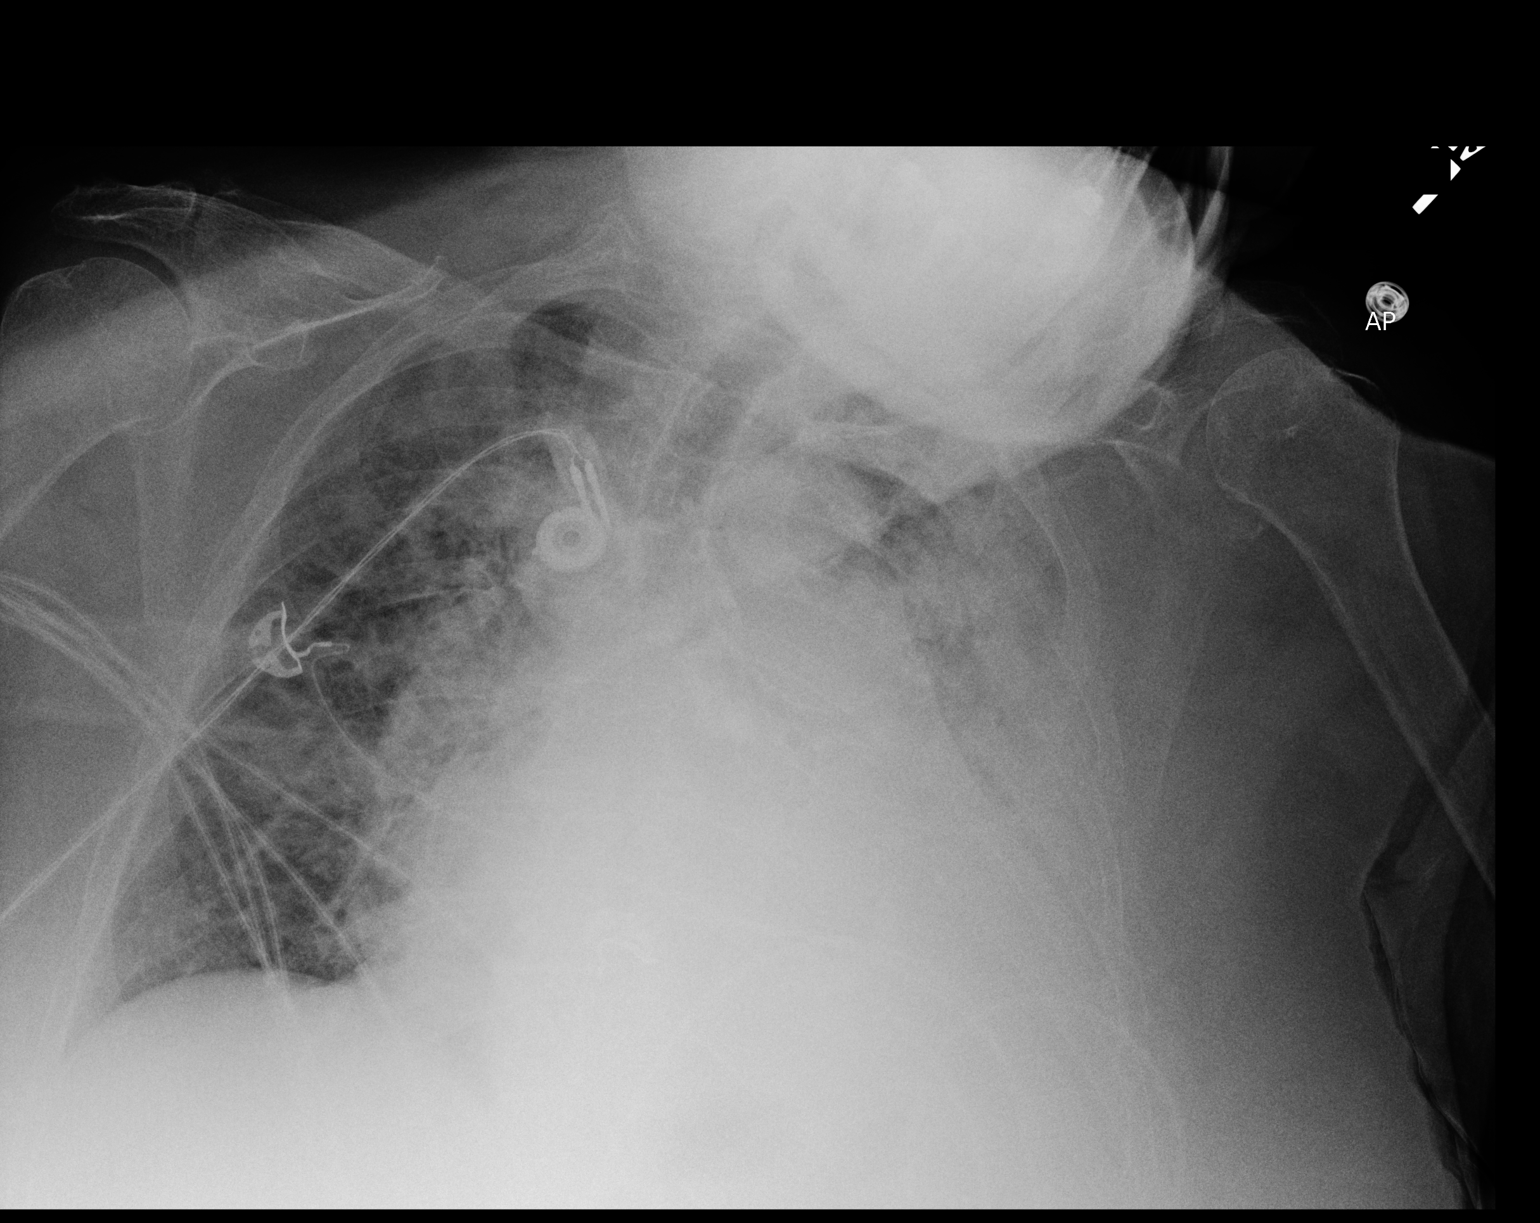

[1 of 1 positions shown; findings below may reference images not displayed]

FINDINGS: There has been progression of airspace consolidation on the left.
There is now airspace consolidation throughout most of the left
lung. There is a small left pleural effusion. The right lung is
clear. There is cardiomegaly with pulmonary venous hypertension.
There is aortic atherosclerosis. No adenopathy. No evident bone
lesions.
IMPRESSION: Consolidation throughout most of the left lung felt to represent
widespread pneumonia, progressed from recent study. Right lung
clear. Underlying pulmonary vascular congestion. There is aortic
atherosclerosis.

Aortic Atherosclerosis (HL104-WEX.X).

## 2019-12-24 MED ORDER — TORSEMIDE 20 MG PO TABS
40.0000 mg | ORAL_TABLET | Freq: Two times a day (BID) | ORAL | 6 refills | Status: DC
Start: 2019-12-24 — End: 2020-04-16

## 2019-12-24 NOTE — Progress Notes (Signed)
Virtual Visit via Telephone Note   This visit type was conducted due to national recommendations for restrictions regarding the COVID-19 Pandemic (e.g. social distancing) in an effort to limit this patient's exposure and mitigate transmission in our community.  Due to her co-morbid illnesses, this patient is at least at moderate risk for complications without adequate follow up.  This format is felt to be most appropriate for this patient at this time.  The patient did not have access to video technology/had technical difficulties with video requiring transitioning to audio format only (telephone).  All issues noted in this document were discussed and addressed.  No physical exam could be performed with this format.  Please refer to the patient's chart for her  consent to telehealth for [facility].   The patient was identified using 2 identifiers.  Date:  12/24/2019   ID:  [name], [name] 1951-01-30, MRN [id number]  Patient Location: Home Provider Location: Office  PCP:  Gareth Morgan, MD  Cardiologist:  Dina Rich, MD  Electrophysiologist:  None   Evaluation Performed:  Follow-Up Visit  Chief Complaint:  Follow up visit  History of Present Illness:    [name] is a 69 y.o. female seen today for follow up of the following medical problems.   1. Chronicsystolic/ diastolic CHF with mixed precap and post postcap Pulmonary Hypertension - echo 12/2013 PASP 85, moderate TR, RV mild to moderately dilated with normal function, could not eval diasotlic function due to afib, LVEF 50-55%. Biatrial enlargement.  - RHC 01/2015 PA 38/13 and calculated mean of 21, could not get a wedge however LVEDP 15. - repeat echo 10/2014 PASP 61 mmHg (down from 85 on prior echo), some evidence of RV dysfunction with RV TAPSE 1.4 and tissue anular systolic velocity of 7.5. Cannot evaluate diastolic function, however severe biatrial enlarement suggests significant dysfunction, - 01/2015 RHC mean  PA 52, PCWP 31 - 01/2015 echo LVEF 55-60%, PASP 46  - after most recent RHC in 01/2015 she was admitted for diuresis. Discharge weight 170 lbs, reportedly down from 190 on admission. Marland Kitchen Discharged on lasix 60mg  bid.     - home weights at 241 lbs. No recent edema, some abdominal distension. No SOB/DOE - beginning of June weight was 233 lbs  2. Afib - amio started 02/2015.  - has had severe diffusion dfect on PFTs 10/2016prior  - no recent palpitations. Family noted an isolated heart rate by vitals check of 117, no recurrent tachycardia.     3. OSA - could not tolerate CPAP  4. AMS -ER visit 11/16/19, found to have UTI. CXR suspected atypical pneumonia - Cr elevated to 1.25 in ER.    5. Fatigue - recent fatigue, feeling drowsy - is to f/u with pcp   SH: completed her covid vaccine x2.  The patient does not have symptoms concerning for COVID-19 infection (fever, chills, cough, or new shortness of breath).    Past Medical History:  Diagnosis Date  . Abnormal pulmonary function test   . Anemia    H/H of 10/30 with a normal MCV in 12/09  . Anxiety   . Arthritis   . Barrett's esophagus    Diagnosed 1995. Last EGD 2016-NO BARRETT'S.   . Chest pain    Negative cardiac catheterization in 2002; negative stress nuclear study in 2008  . Chronic anticoagulation   . Chronic combined systolic and diastolic CHF (congestive heart failure) (HCC)    a. EF predominantly normal during prior echoes  but was 40% during 10/2014 echo. b. Most recent 01/2015 EF normal, 55-60%.  . Chronic LBP    Surgical intervention in 1996  . Diabetes mellitus, type 2 (HCC)    Insulin therapy; exacerbated by prednisone  . Dysrhythmia    AFib  . Gastroparesis    99% retention 05/2008 on GES  . GERD (gastroesophageal reflux disease)   . Hiatal hernia   . Hyperlipidemia   . Hypertension   . Hypothyroid   . IBS (irritable bowel syndrome)   . Obesity   . OSA on CPAP    had CPAP and cannot  tolerate.  . Paroxysmal atrial fibrillation (HCC)   . Pulmonary hypertension (HCC) 01/2015   a. Predominantly pulmonary venous hypertension but may be component of PAH.  . Seizures (HCC)    last seizure was 2 years ago; on keppra for this; unknown etiology  . Syncope    a. Admitted 05/2009; magnetic resonance imagin/ MRA - negative; etiology thought to be orthostasis secondary to drugs and dehydration. b. Syncope 02/2015 also felt 2/2 dehydration.   Past Surgical History:  Procedure Laterality Date  . BACK SURGERY  1996  . BIOPSY N/A 11/08/2013   Procedure: BIOPSY  / Tissue sampling / ulcers present in small intestine;  Surgeon: West Bali, MD;  Location: [facility];  Service: Endoscopy;  Laterality: N/A;  . CARDIAC CATHETERIZATION  2002  . CARDIAC CATHETERIZATION N/A 01/26/2015   Procedure: Right Heart Cath;  Surgeon: Laurey Morale, MD;  Location: [facility] INVASIVE CV LAB;  Service: Cardiovascular;  Laterality: N/A;  . CARDIOVASCULAR STRESS TEST  2008   Stress nuclear study  . CARDIOVERSION N/A 03/06/2015   Procedure: CARDIOVERSION;  Surgeon: Laurey Morale, MD;  Location: [facility] ENDOSCOPY;  Service: Cardiovascular;  Laterality: N/A;  . CARPAL TUNNEL RELEASE  1994  . COLONOSCOPY  11/2011   Dr. Darrick Penna: Internal hemorrhoids, mild diverticulosis. Random colon biopsies negative.  . COLONOSCOPY N/A 11/08/2013   SLF: Normal mucosa in the terminal ileum/The colon IS redundant/  Moderate diverticulosis throughout the entire colon. ileum bx benign. colon bx benign  . ESOPHAGOGASTRODUODENOSCOPY  2008   Barrett's without dysplasia. esphagus dilated. antral erosions, h.pylori serologies negative.  . ESOPHAGOGASTRODUODENOSCOPY  11/2011   Dr. Darrick Penna: Barrett's esophagus, mild gastritis, diverticulum in the second portion of the duodenum repeat EGD 3 years. Small bowel biopsies negative. Gastric biopsy show reactive gastropathy but no H. pylori. Esophageal biopsies consistent with GERD. Next EGD 11/2014  .  ESOPHAGOGASTRODUODENOSCOPY N/A 11/21/2014   TDV:VOHY non-erosive gastritis/irregular z-line  . GIVENS CAPSULE STUDY  12/07/2011   Proximal small bowel, rare AVM. Distal small bowel, multiple ulcers noted  . GIVENS CAPSULE STUDY N/A 09/27/2013   Distal small bowel ulcers extending to TI.  Marland Kitchen GIVENS CAPSULE STUDY N/A 10/10/2013   Procedure: GIVENS CAPSULE STUDY;  Surgeon: West Bali, MD;  Location: [facility];  Service: Endoscopy;  Laterality: N/A;  7:30  . IR KYPHO THORACIC WITH BONE BIOPSY  02/09/2018  . KNEE ARTHROSCOPY WITH MEDIAL MENISECTOMY Right 06/09/2016   Procedure: KNEE ARTHROSCOPY WITH MEDIAL MENISECTOMY;  Surgeon: Vickki Hearing, MD;  Location: [facility] ORS;  Service: Orthopedics;  Laterality: Right;  medial and lateral menisectomy  . LAMINECTOMY  1995   L4-L5  . LAPAROSCOPIC CHOLECYSTECTOMY  1990s  . LEFT HEART CATHETERIZATION WITH CORONARY ANGIOGRAM  01/10/2014   Procedure: LEFT HEART CATHETERIZATION WITH CORONARY ANGIOGRAM;  Surgeon: Lesleigh Noe, MD;  Location: [facility] CATH LAB;  Service: Cardiovascular;;  .  RIGHT HEART CATHETERIZATION N/A 01/10/2014   Procedure: RIGHT HEART CATH;  Surgeon: Lesleigh Noe, MD;  Location: [facility] CATH LAB;  Service: Cardiovascular;  Laterality: N/A;  . TOTAL ABDOMINAL HYSTERECTOMY  1999  . TRACHEOSTOMY TUBE PLACEMENT N/A 09/01/2017   Procedure: TRACHEOSTOMY;  Surgeon: Drema Halon, MD;  Location: [facility] OR;  Service: ENT;  Laterality: N/A;     No outpatient medications have been marked as taking for the 12/24/19 encounter (Appointment) with Antoine Poche, MD.     Allergies:   Celexa [citalopram hydrobromide], Codeine, Dilaudid [hydromorphone hcl], Reglan [metoclopramide], and Hyoscyamine   Social History   Tobacco Use  . Smoking status: Former Smoker    Packs/day: 0.25    Years: 15.00    Pack years: 3.75    Types: Cigarettes    Start date: 02/26/1966    Quit date: 07/01/1983    Years since quitting: 36.5  . Smokeless tobacco: Never  Used  . Tobacco comment: quit in 1984  Vaping Use  . Vaping Use: Never used  Substance Use Topics  . Alcohol use: No    Alcohol/week: 0.0 standard drinks  . Drug use: No     Family Hx: The patient's family history includes Alzheimer's disease in her mother; Breast cancer in her sister; Heart attack in her mother; Hypertension in her mother and another family member; Stroke in her mother. There is no history of Heart disease or Colon cancer.  ROS:   Please see the history of present illness.     All other systems reviewed and are negative.   Prior CV studies:   The following studies were reviewed today:  12/2013 Echo Study Conclusions  - Limited study to evaluate LV systolic function. - Left ventricle: The cavity size was normal. Wall thickness was normal. Systolic function was normal. The estimated ejection fraction was in the range of 50% to 55%. Wall motion was normal; there were no regional wall motion abnormalities. The study was not technically sufficient to allow evaluation of LV diastolic dysfunction due to atrial fibrillation. - Aortic valve: Valve area (VTI): 2.49 cm^2. Valve area (Vmax): 2.11 cm^2. - Mitral valve: There was moderate regurgitation. The MR vena contracta is 0.4 cm. - Left atrium: The atrium was severely dilated. - Right ventricle: The cavity size was mildly to moderately dilated. - Right atrium: The atrium was severely dilated. - Atrial septum: From available images no evidence of ASD or PFO by 2D or color Doppler images. - Tricuspid valve: There was moderate regurgitation. - Pulmonary arteries: Systolic pressure was severely increased. PA peak pressure: 85 mm Hg (S).  01/2014 RHC HEMODYNAMICS: Aortic pressure 123/73 mmHg; LV pressure 122/6 mmHg; LVEDP 15 mm mercury; RA 4 mmHg; RV 31/5; PA 38/13 mmHg; PCWP(mean) unable to wedge the balloon tip catheter; Cardiac Output by Fick 4.73 L per minute  ANGIOGRAPHIC DATA: The  left main coronary artery is normal.  The left anterior descending artery is widely patent, traverses the LV apex, gives origin to 2 diagonals, and has no significant obstruction..  The left circumflex artery is widely patent and gives a single large obtuse marginal Reesha Debes that trifurcates on the lateral wall. No significant obstruction.  The right coronary artery is dominant, gives 3 left ventricular branches and a large bifurcating PDA. No obstructions noted.Marland Kitchen  LEFT VENTRICULOGRAM: Left ventricular angiogram was done in the 30 RAO projection and revealed normal LV cavity size, tachycardia due to atrial fibrillation, and mild global reduction in LV function with an  estimated ejection fraction of 40%   IMPRESSIONS: 1. Widely patent coronary arteries 2. Mild pulmonary hypertension 3. Mildly depressed LV systolic function with an estimated EF of 40% 3. This did not confirm significant pulmonary hypertension. The procedure did suggest that the right atrium and right ventricle were enlarged as we had difficulty manipulating the right heart catheter into the pulmonary artery.   RECOMMENDATION: Back to the outpatient area at home later today if no complications. She should not resume eloquence until 24 hours post procedure..  10/2014 Echo Study Conclusions  - Left ventricle: The cavity size was normal. Wall thickness was increased in a pattern of mild LVH. There was mild concentric hypertrophy. The estimated ejection fraction was 40%. Diffuse hypokinesis. - Mitral valve: Calcified annulus. There was mild regurgitation. - Left atrium: The atrium was severely dilated. - Right atrium: The atrium was severely dilated. - Atrial septum: Thinned and stretched but no obvious PFO. No defect or patent foramen ovale was identified. - Tricuspid valve: There was moderate regurgitation. - Impressions: E/E&' not reported and in afib diastolic evaluation not  possible.  Impressions:  - E/E&' not reported and in afib diastolic evaluation not possible.   01/2015 RHC    1. Severely elevated right and left heart filling pressures.  2. Primarily pulmonary venous hypertension (may be additional component of PAH with PVR 4.6 WU).   Plan:  I am going to admit her for IV diuresis.      Right Heart Pressures RHC Procedural Findings: Hemodynamics (mmHg) RA mean 23 RV 68/14 PA 70/36, mean 52 PCWP mean 31  Oxygen saturations: PA 60% AO 98%  Cardiac Output (Fick) 4.54  Cardiac Index (Fick) 2.32  PVR 4.6 WU    11/2017 echo Study Conclusions  - Left ventricle: The cavity size was normal. There was moderate concentric hypertrophy. Systolic function was normal. The estimated ejection fraction was in the range of 60% to 65%. Wall motion was normal; there were no regional wall motion abnormalities. Doppler parameters are consistent with abnormal left ventricular relaxation (grade 1 diastolic dysfunction). - Aortic valve: Transvalvular velocity was within the normal range. There was no stenosis. There was trivial regurgitation. - Mitral valve: Transvalvular velocity was within the normal range. There was no evidence for stenosis. There was no regurgitation. - Left atrium: The atrium was severely dilated. - Right ventricle: The cavity size was normal. Wall thickness was normal. Systolic function was normal. - Tricuspid valve: There was mild regurgitation. - Pulmonary arteries: PA peak pressure: 46 mm Hg (S).   Labs/Other Tests and Data Reviewed:    EKG:  No ECG reviewed.  Recent Labs: 11/16/2019: ALT 15; B Natriuretic Peptide 263.0; BUN 22; Creatinine, Ser 1.25; Hemoglobin 9.9; Platelets 253; Potassium 4.3; Sodium 138   Recent Lipid Panel Lab Results  Component Value Date/Time   CHOL 167 02/16/2018 10:43 AM   TRIG 179 (H) 02/16/2018 10:43 AM   HDL 38 (L) 02/16/2018 10:43 AM   CHOLHDL 4.4 02/16/2018  10:43 AM   LDLCALC 93 02/16/2018 10:43 AM    Wt Readings from Last 3 Encounters:  11/16/19 234 lb (106.1 kg)  10/15/19 234 lb (106.1 kg)  02/22/19 241 lb (109.3 kg)     Objective:    Vital Signs:   Today's Vitals   12/24/19 0844  BP: 105/85  Pulse: 82  Weight: 241 lb (109.3 kg)  Height: 5\' 5"  (1.651 m)   Body mass index is 40.1 kg/m. Normal affect. Normal speech pattern and tone. Comfortable, no  apparent distress. No audible signs of sob or wheezing.   ASSESSMENT & PLAN:    1.Acute on chronic diastolic HF with mixed pulmonary HTN Weight up from 233 to 241 lbs, some reported abodminal distension - increase torsemide from her 40mg  in am 20mg  in pm to 40mg  bid. Repeat labs on Monday and family to update [place] on weights.   2. Afib -isolated high heart rate, monitor at this time - continue current meds   COVID-19 Education: The signs and symptoms of COVID-19 were discussed with the patient and how to seek care for testing (follow up with PCP or arrange E-visit).  The importance of social distancing was discussed today.  Time:   Today, I have spent 16 minutes with the patient with telehealth technology discussing the above problems.     Medication Adjustments/Labs and Tests Ordered: Current medicines are reviewed at length with the patient today.  Concerns regarding medicines are outlined above.   Tests Ordered: No orders of the defined types were placed in this encounter.   Medication Changes: No orders of the defined types were placed in this encounter.   Follow Up:  Either In Person or Virtual in 4 month(s)  Signed, Dina Rich, MD  12/24/2019 7:45 AM    [facility]

## 2019-12-24 NOTE — Addendum Note (Signed)
Addended by: Kerney Elbe on: 12/24/2019 10:02 AM   Modules accepted: Orders

## 2019-12-24 NOTE — Patient Instructions (Signed)
Medication Instructions:  Your physician has recommended you make the following change in your medication:  Increase Torsemide to 40 mg Two Times Daily  Call on Monday with Weights.   *If you need a refill on your cardiac medications before your next appointment, please call your pharmacy*   Lab Work: Your physician recommends that you return for lab work in: Monday (CMET, Mg, CBC, TSH)   If you have labs (blood work) drawn today and your tests are completely normal, you will receive your results only by: Marland Kitchen MyChart Message (if you have MyChart) OR . A paper copy in the mail If you have any lab test that is abnormal or we need to change your treatment, we will call you to review the results.   Testing/Procedures: NONE    Follow-Up: At [facility], you and your health needs are our priority.  As part of our continuing mission to provide you with exceptional heart care, we have created designated Provider Care Teams.  These Care Teams include your primary Cardiologist (physician) and Advanced Practice Providers (APPs -  Physician Assistants and Nurse Practitioners) who all work together to provide you with the care you need, when you need it.  We recommend signing up for the patient portal called "MyChart".  Sign up information is provided on this After Visit Summary.  MyChart is used to connect with patients for Virtual Visits (Telemedicine).  Patients are able to view lab/test results, encounter notes, upcoming appointments, etc.  Non-urgent messages can be sent to your provider as well.   To learn more about what you can do with MyChart, go to [email or web address].    Your next appointment:   4 month(s)  The format for your next appointment:   In Person  Provider:   Dina Rich, MD   Other Instructions Thank you for choosing [facility]!

## 2019-12-26 ENCOUNTER — Encounter: Payer: Self-pay | Admitting: Gastroenterology

## 2019-12-27 IMAGING — CR DG CHEST 1V PORT
1 series · 1 of 1 positions shown · non-contrast
Comparison: 08/10/2017

CLINICAL DATA: Respiratory failure

EXAM:
PORTABLE CHEST 1 VIEW

[portable]
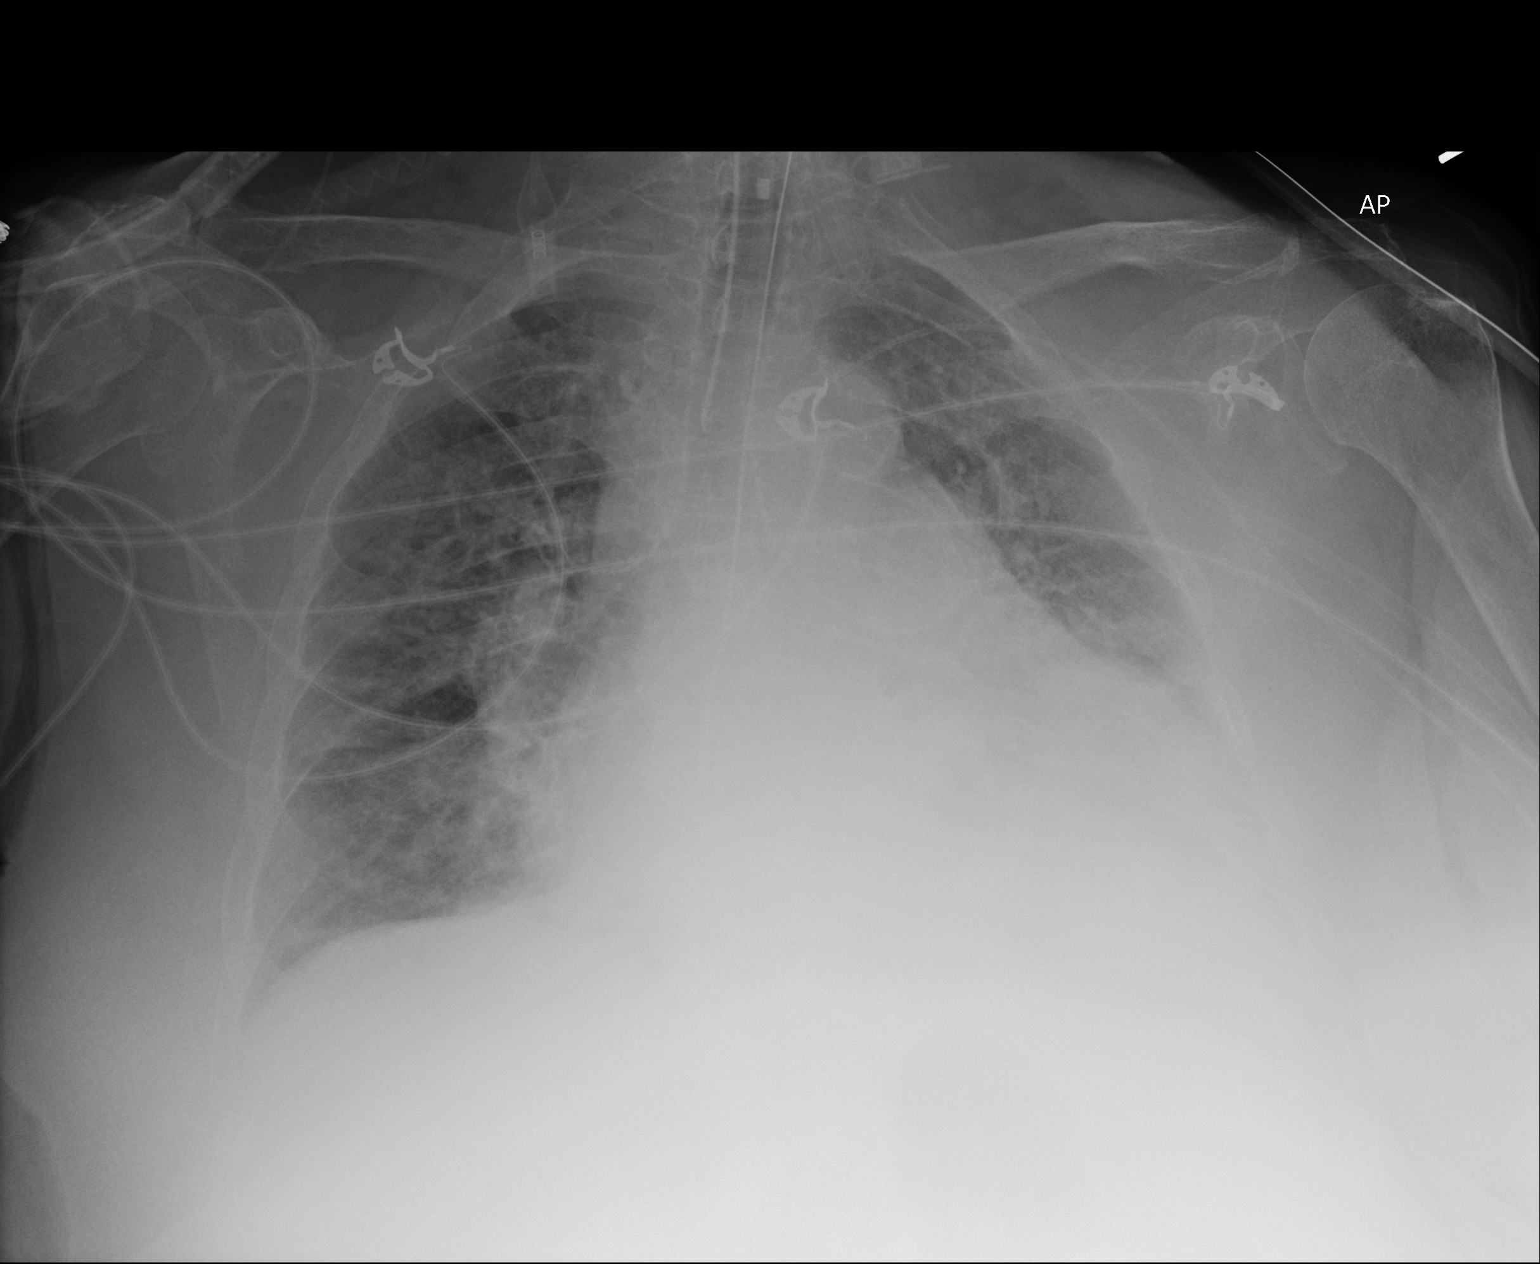

[1 of 1 positions shown; findings below may reference images not displayed]

FINDINGS: Cardiac shadow remains enlarged. Aortic calcifications are again
seen. Endotracheal tube and nasogastric catheter are noted in
satisfactory position. Diffuse airspace opacities are again
identified bilaterally with some improved aeration in the left upper
lobe. No bony abnormality is seen.
IMPRESSION: Slight improved aeration particularly on the left.

## 2019-12-28 IMAGING — CR DG CHEST 1V PORT
1 series · 1 of 1 positions shown · non-contrast
Comparison: Yesterday

CLINICAL DATA: Respiratory failure with shortness of breath

EXAM:
PORTABLE CHEST 1 VIEW

[portable]
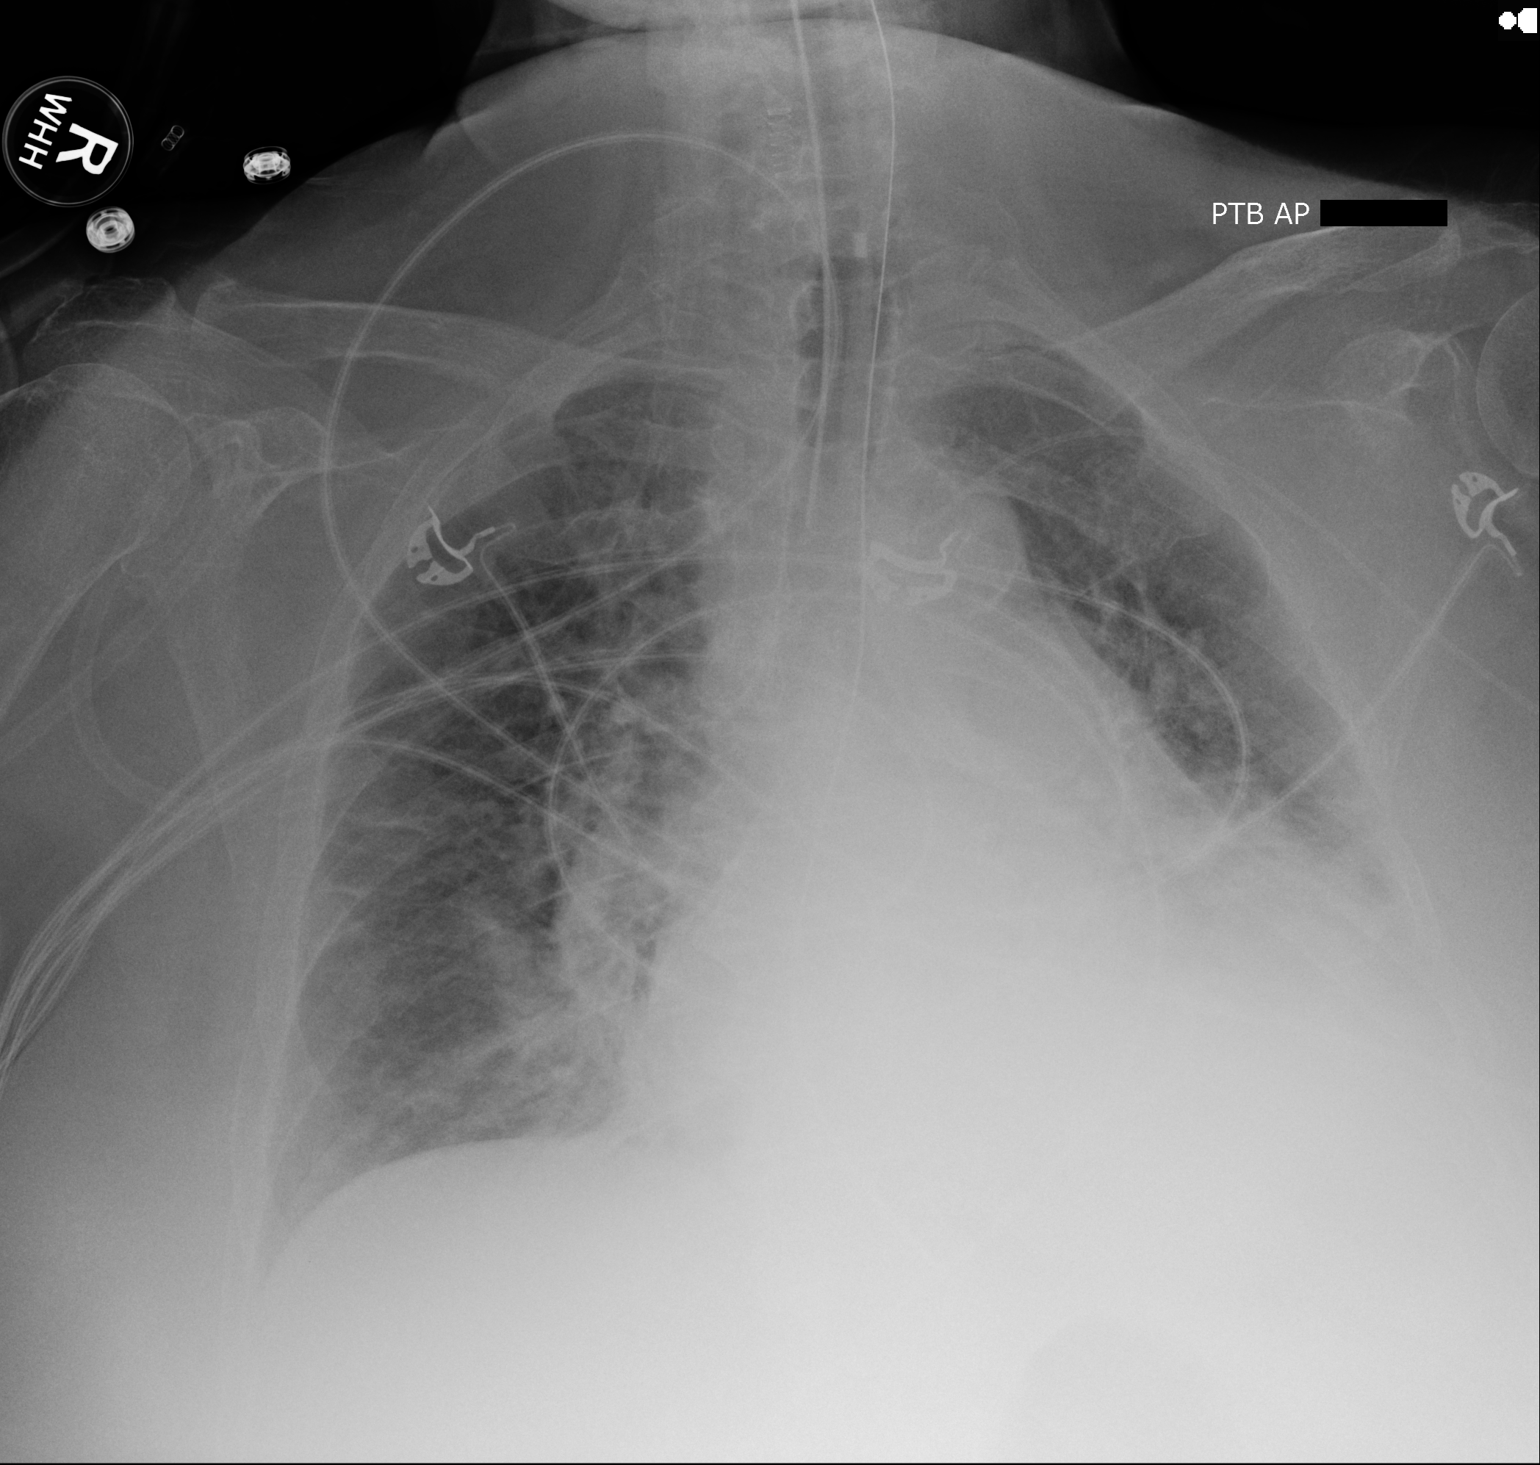

[1 of 1 positions shown; findings below may reference images not displayed]

FINDINGS: Endotracheal tube tip between the clavicular heads and carina.
Orogastric tube with tip at the GE junction. Cardiopericardial
enlargement. There is diffuse hazy opacity of the chest with
probable pleural fluid greater on the left. No visible pneumothorax.

Artifact from EKG leads.

These results will be called to the ordering clinician or
representative by the Radiologist Assistant, and communication
documented in the PACS or zVision Dashboard.
IMPRESSION: 1. Orogastric tube tip at the GE junction. Consider 
5 cm
advancement into the stomach.
2. CHF pattern similar to yesterday.

## 2019-12-28 IMAGING — CR DG CHEST 1V PORT SAME DAY
1 series · 2 of 2 positions shown · non-contrast
Comparison: 08/12/2017

CLINICAL DATA: OG tube replacement.

EXAM:
PORTABLE CHEST 1 VIEW

[Series 1: ap · 0.17mm/px · 2 of 2 slices shown]
[im 1/2]
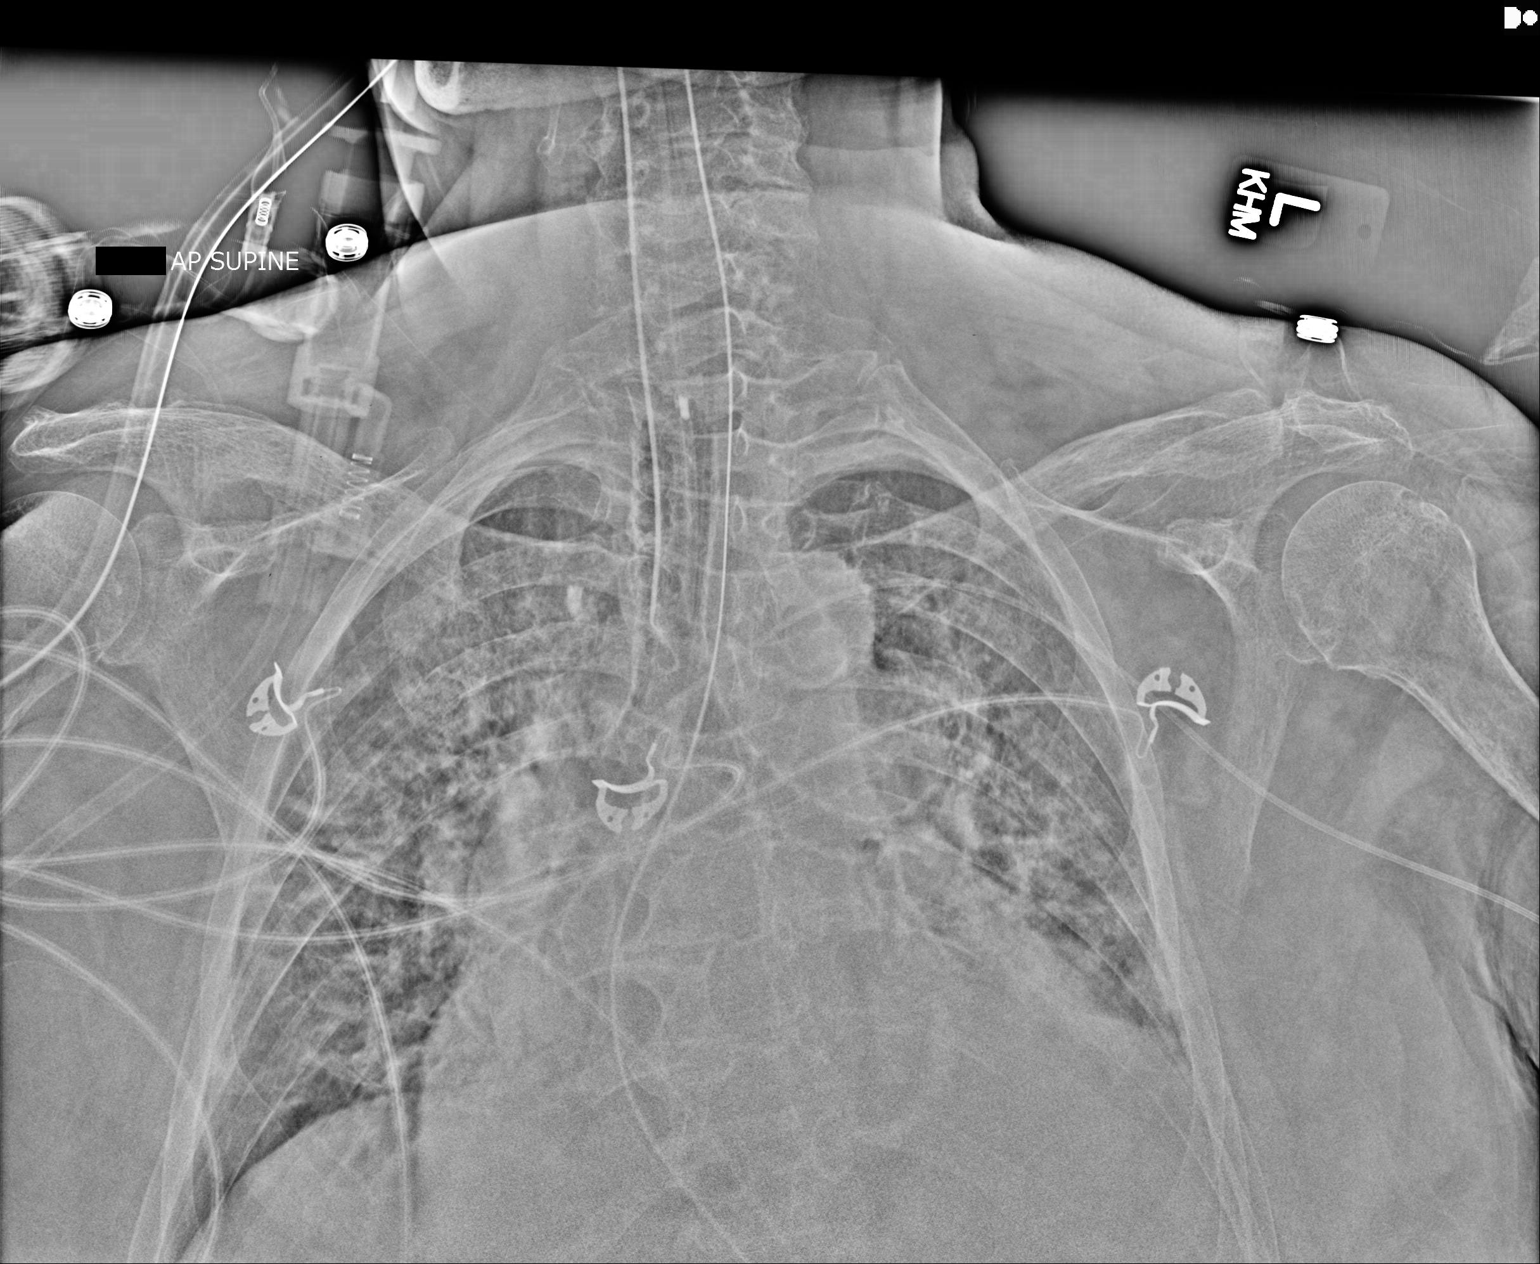
[im 2/2]
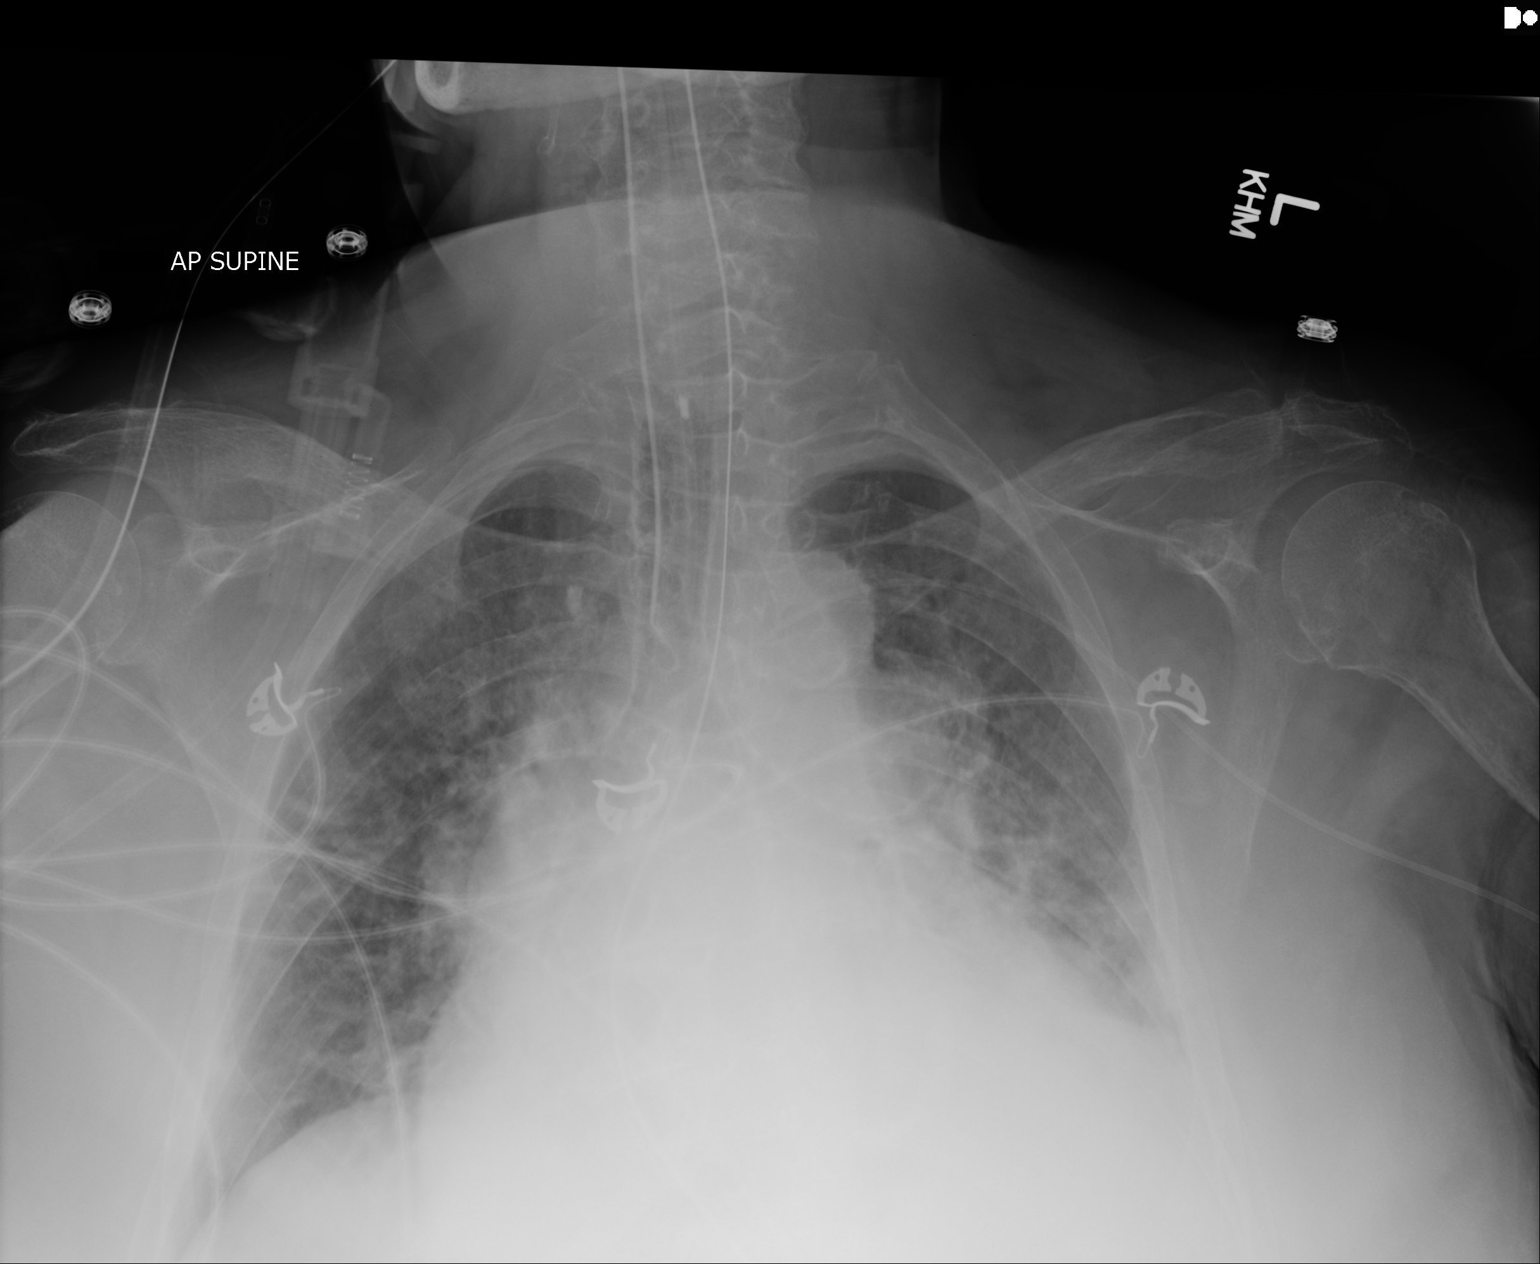

[2 of 2 positions shown; findings below may reference images not displayed]

FINDINGS: An endotracheal tube is identified with 2 cm above the carina.

And OG tube enters the stomach with tip off the field of view.

Cardiomegaly, pulmonary vascular congestion, right basilar
atelectasis and left lower lung consolidation/atelectasis again
noted.

There is no evidence of pneumothorax.
IMPRESSION: Support apparatus as described. Otherwise unchanged appearance of
the chest.

## 2019-12-30 IMAGING — CR DG CHEST 1V PORT
1 series · 1 of 1 positions shown · non-contrast
Comparison: Yesterday

CLINICAL DATA: Respiratory failure

EXAM:
PORTABLE CHEST 1 VIEW

[portable]
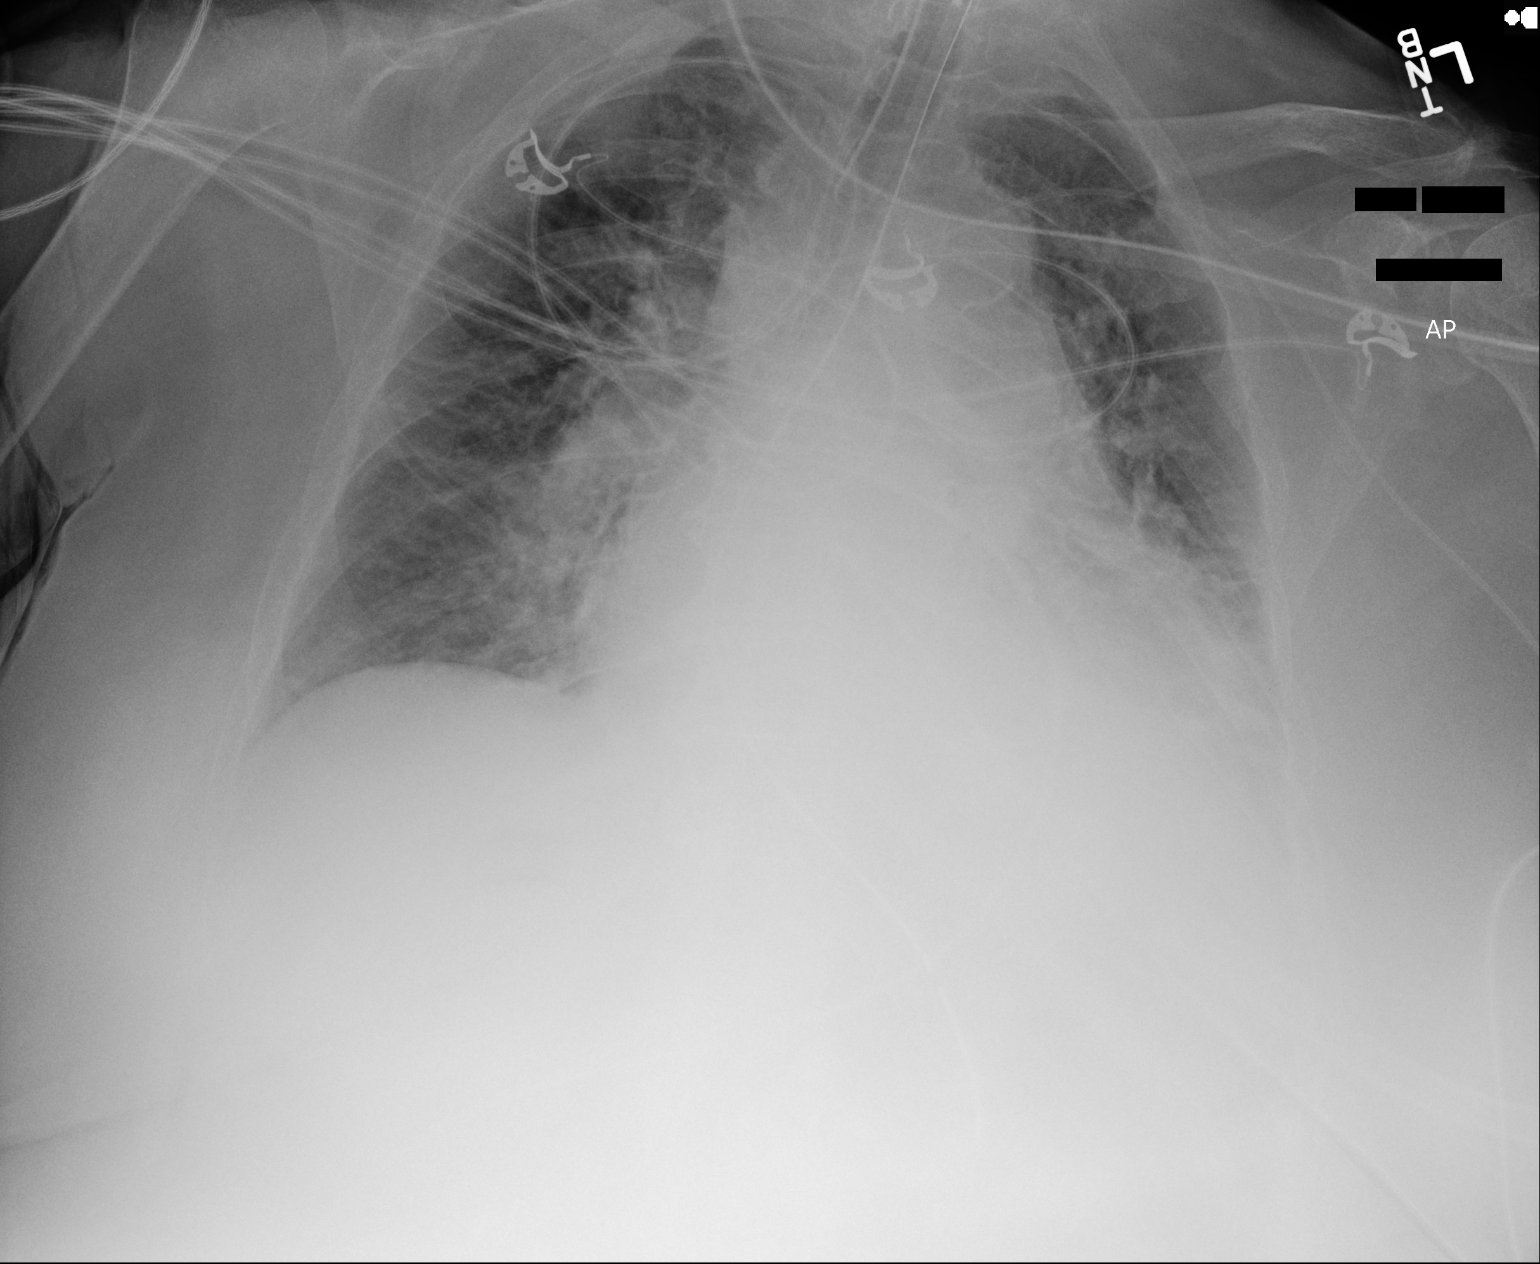

[1 of 1 positions shown; findings below may reference images not displayed]

FINDINGS: Endotracheal tube tip is between the clavicular heads and carina. An
orogastric tube and side-port reaches the stomach.

Cardiopericardial enlargement and vascular pedicle widening. There
is diffuse haziness of the chest and interstitial opacities.
IMPRESSION: 1. Unremarkable positioning of endotracheal and orogastric tubes.
2. CHF pattern.  Pneumonia and atelectasis could be superimposed.

## 2019-12-31 ENCOUNTER — Ambulatory Visit
Admission: RE | Admit: 2019-12-31 | Discharge: 2019-12-31 | Disposition: A | Discharge status: Home / Self Care | Payer: PPO | Source: Ambulatory Visit | Attending: Neurological Surgery | Admitting: Neurological Surgery

## 2019-12-31 DIAGNOSIS — M545 Low back pain, unspecified: Secondary | ICD-10-CM

## 2019-12-31 IMAGING — CR DG CHEST 1V PORT
1 series · 1 of 1 positions shown · non-contrast
Comparison: Portable chest x-ray August 14, 2017

CLINICAL DATA: Respiratory failure, CHF, COPD

EXAM:
PORTABLE CHEST 1 VIEW

[portable]
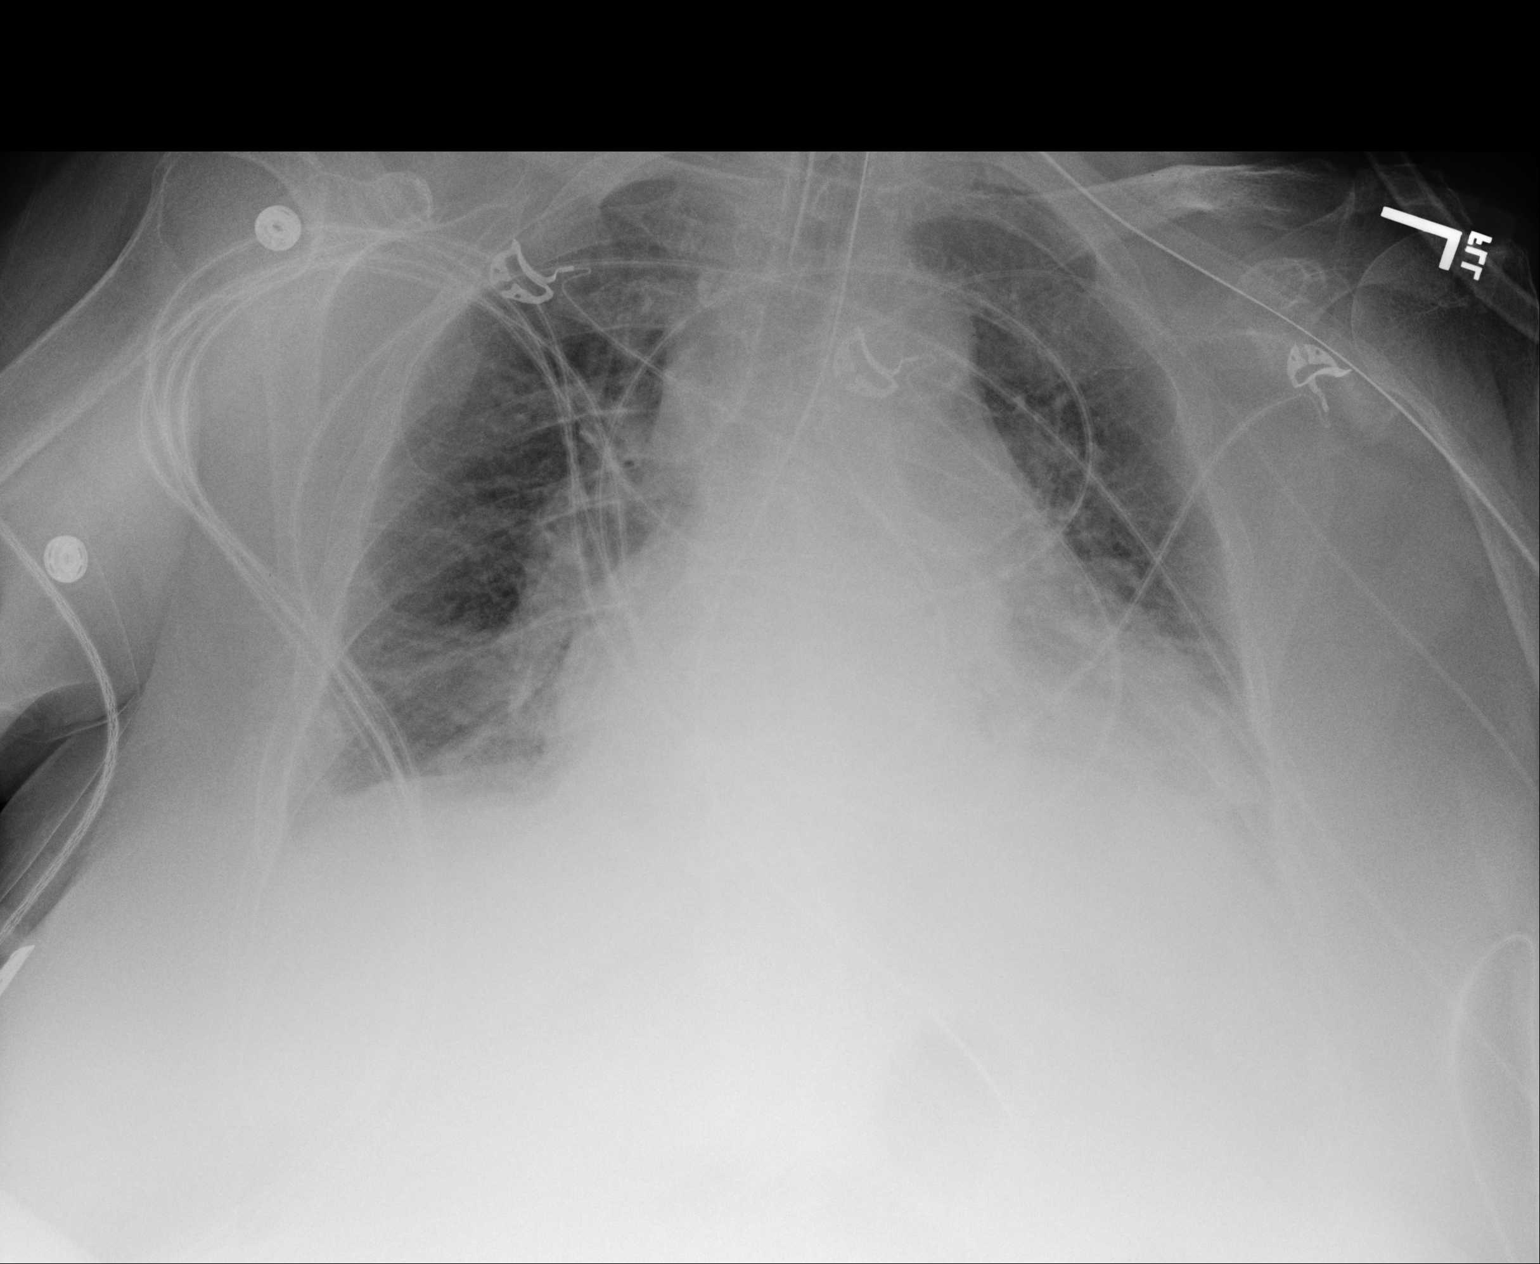

[1 of 1 positions shown; findings below may reference images not displayed]

FINDINGS: The lungs are mildly hypoinflated. The interstitial markings are
coarse though slightly improved. There are densities at both lung
bases which may reflect atelectasis. There is no pleural effusion or
pneumothorax. The cardiac silhouette is enlarged. The pulmonary
vascularity is mildly engorged but less prominent than on the
previous study. There calcification in the wall of the aortic arch.
The endotracheal tube tip projects 4.8 cm above the carina. The
esophagogastric tube tip and proximal port project below the GE
junction.
IMPRESSION: Bilateral hypoinflation. Slightly decreased pulmonary interstitial
edema. Stable cardiomegaly with decreased pulmonary vascular
congestion. Probable bibasilar subsegmental atelectasis.

The support tubes are in reasonable position.

Thoracic aortic atherosclerosis.

## 2020-01-01 IMAGING — CR DG CHEST 1V PORT
1 series · 1 of 1 positions shown · non-contrast
Comparison: 08/15/2017

CLINICAL DATA: Respiratory failure.

EXAM:
PORTABLE CHEST 1 VIEW

[portable]
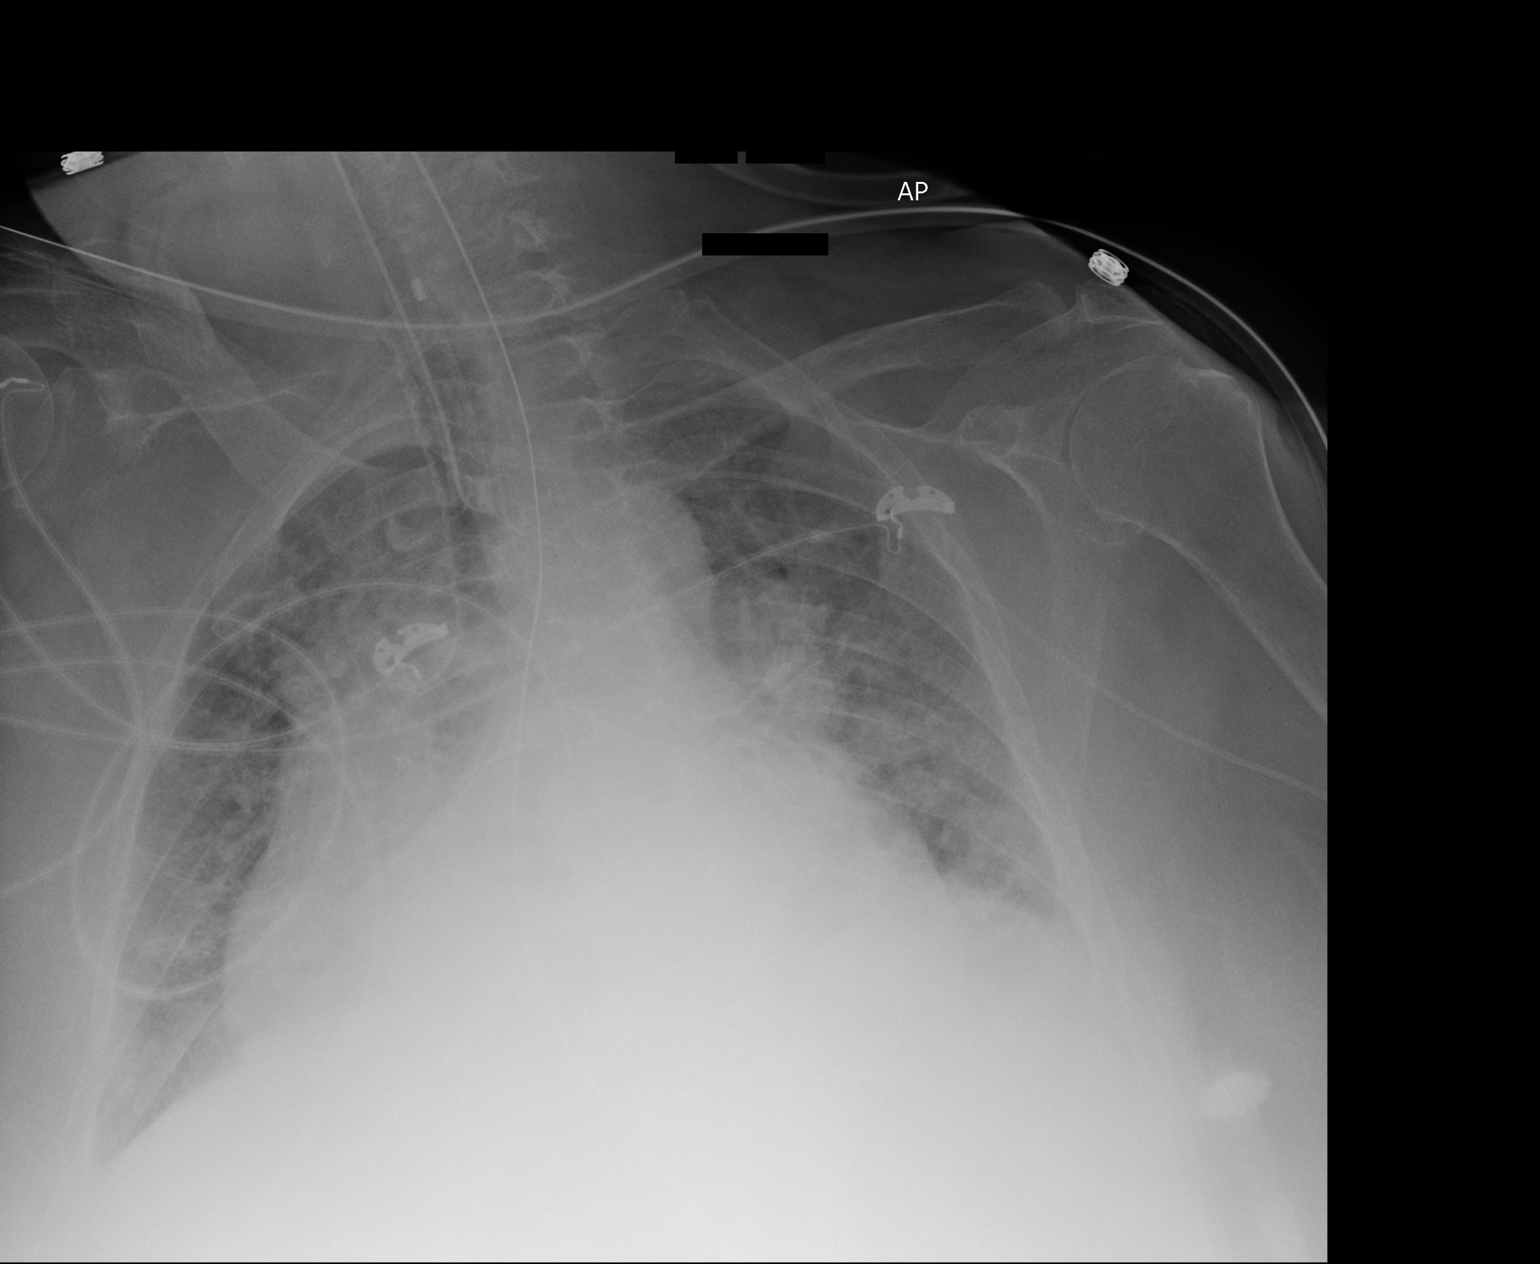

[1 of 1 positions shown; findings below may reference images not displayed]

FINDINGS: The patient is rotated to the right. Endotracheal tube terminates
4.2 cm above the carina. Enteric tube courses into the upper abdomen
with tip not imaged. The cardiac silhouette remains enlarged. Lung
volumes remain low with grossly similar appearance of left greater
than right basilar opacities suggesting atelectasis. Pulmonary
vascular congestion and bilateral interstitial densities have
slightly increased. No large pleural effusion or pneumothorax is
identified.
IMPRESSION: 1. Slightly increased pulmonary edema.
2. Bibasilar atelectasis.

## 2020-01-02 IMAGING — CR DG CHEST 1V PORT SAME DAY
1 series · 1 of 1 positions shown · non-contrast
Comparison: 08/17/2017

CLINICAL DATA: Check gastric catheter placement

EXAM:
PORTABLE CHEST 1 VIEW

[portable]
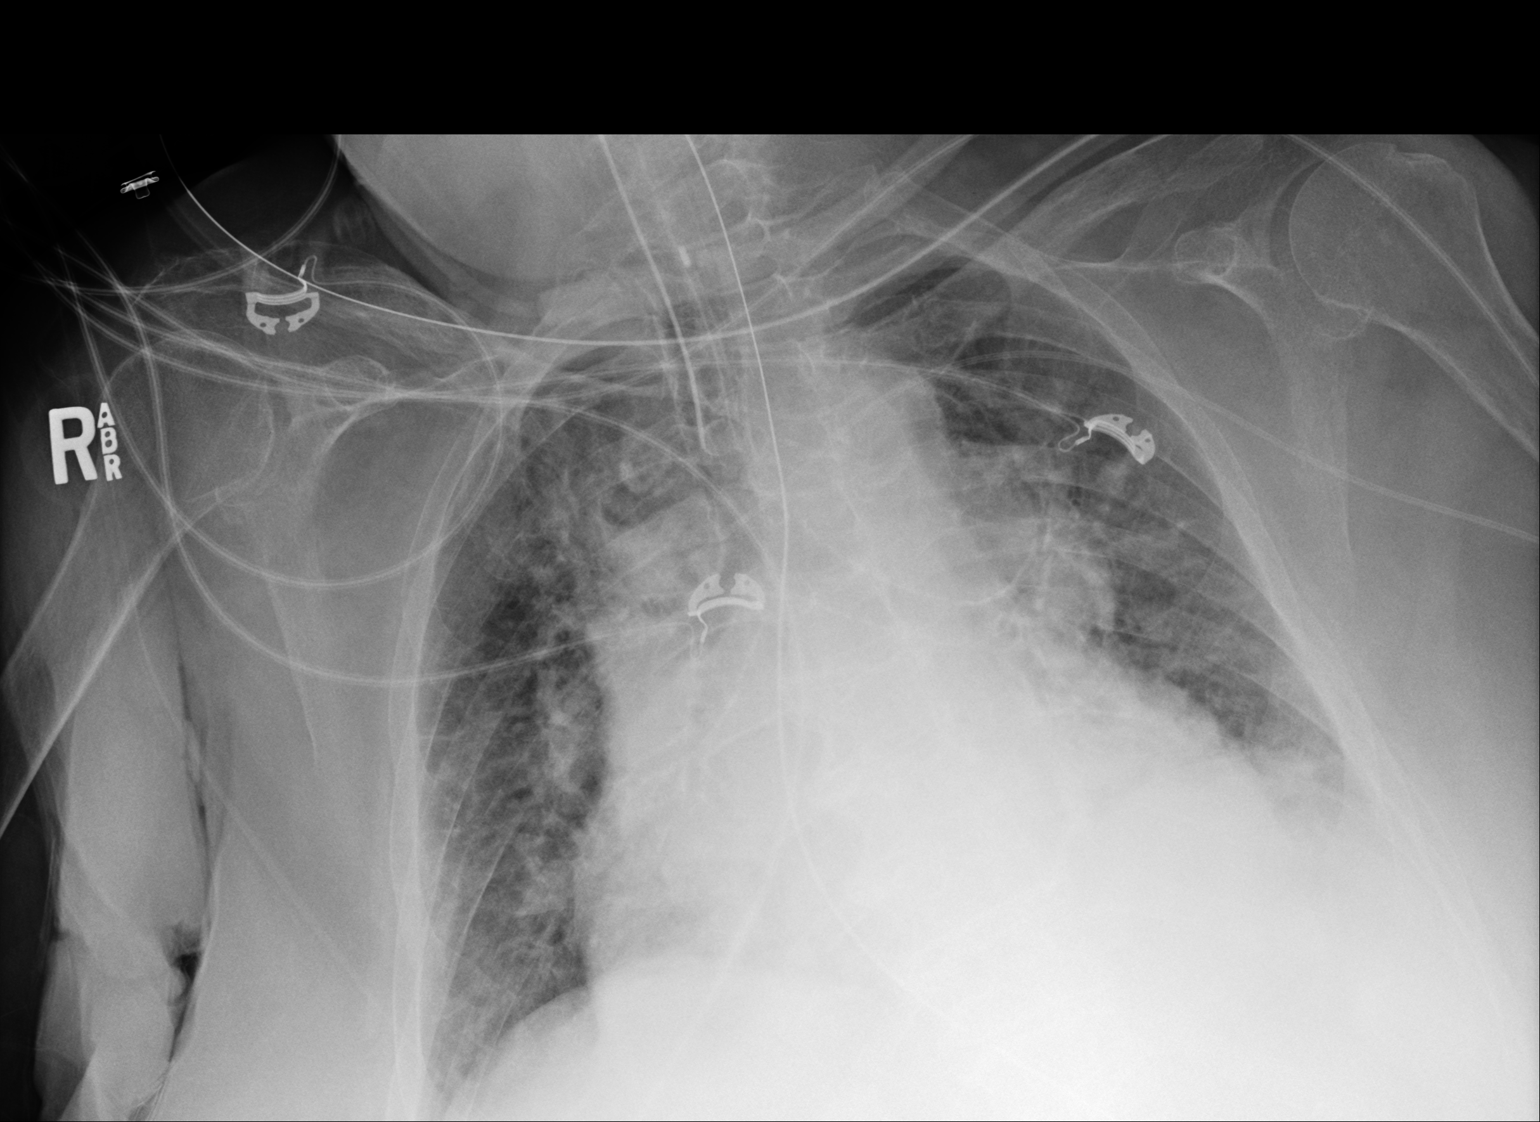

[1 of 1 positions shown; findings below may reference images not displayed]

FINDINGS: Cardiac shadow is enlarged but stable. Endotracheal tube is noted in
satisfactory position. Nasogastric catheter is noted tip within the
stomach. Lungs are well aerated with vascular congestion and
interstitial edema similar that seen prior exam.
IMPRESSION: The overall appearance of the chest is stable from the prior exam.

## 2020-01-02 IMAGING — CR DG CHEST 1V PORT
1 series · 1 of 1 positions shown · non-contrast
Comparison: 08/16/2017.

CLINICAL DATA: Intubation.

EXAM:
PORTABLE CHEST 1 VIEW

[portable]
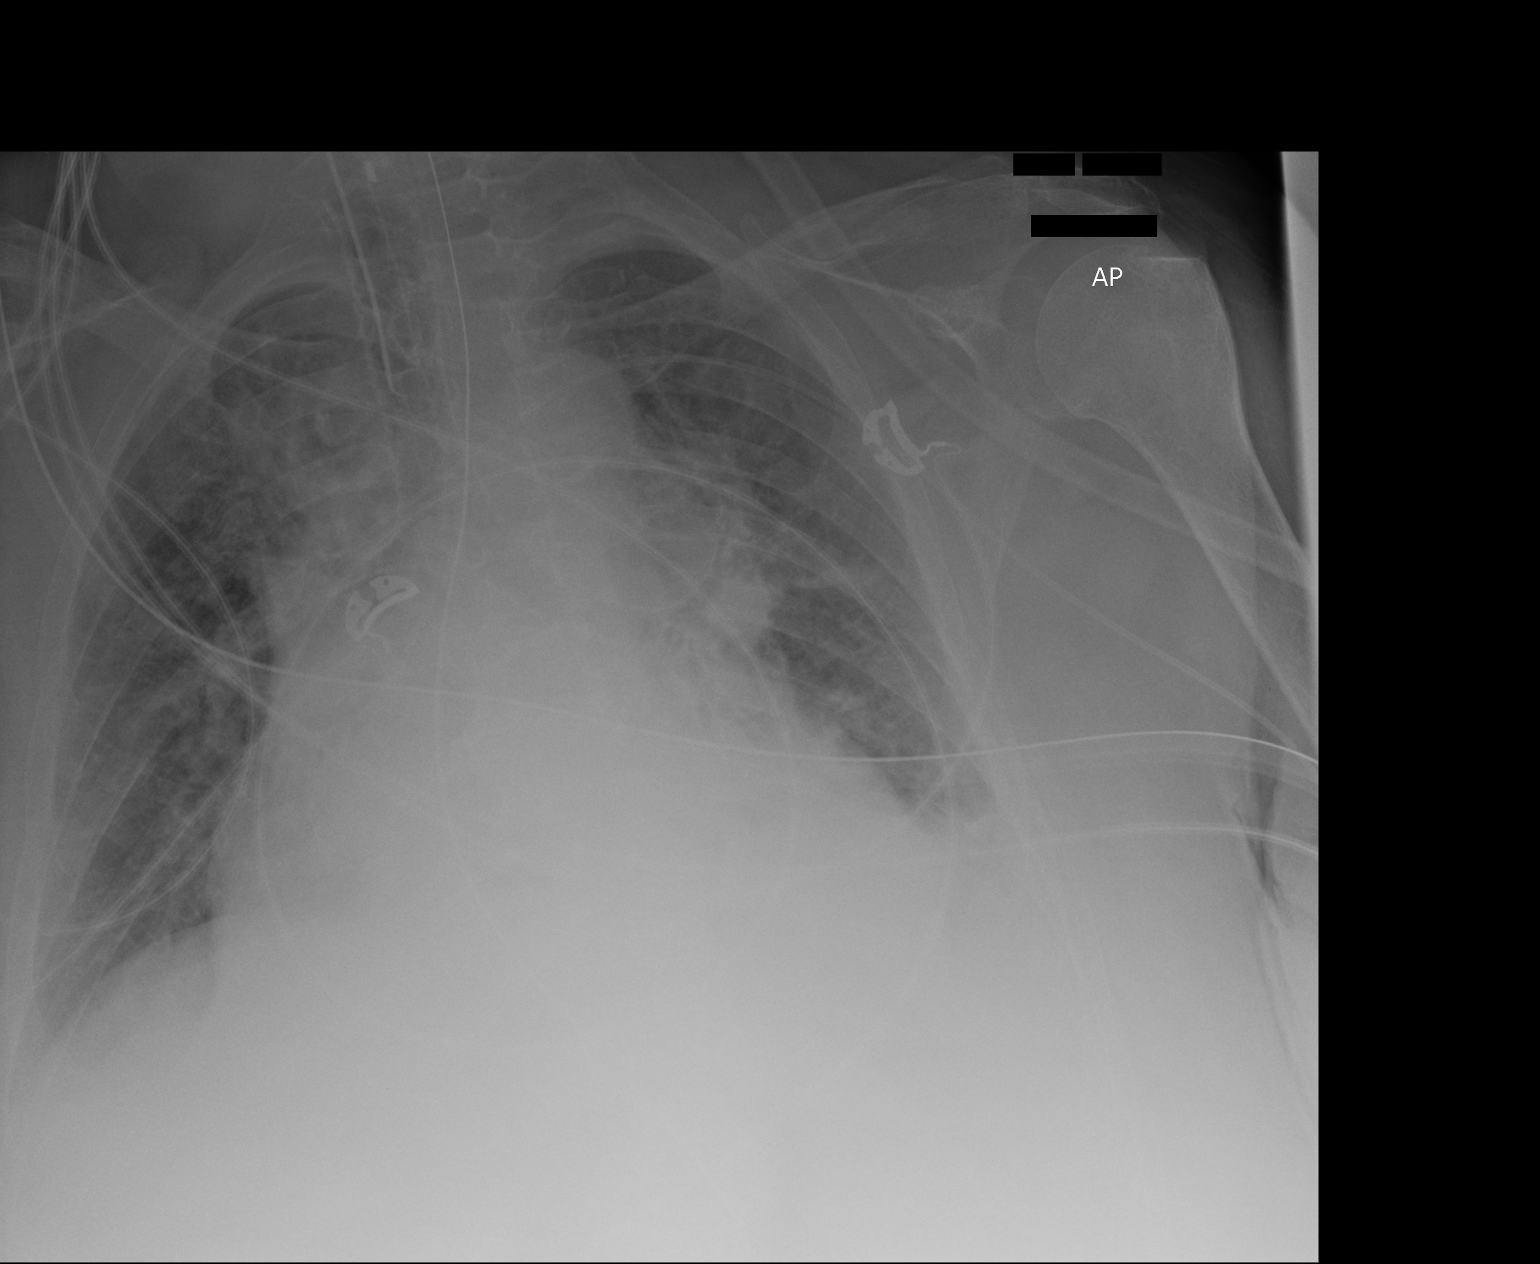

[1 of 1 positions shown; findings below may reference images not displayed]

FINDINGS: Endotracheal tube and NG tube in stable position. Cardiomegaly with
bilateral interstitial prominence consistent pulmonary edema again
noted. No interim change. Small left pleural scratched it small
pleural effusions can't be excluded. No pneumothorax.
IMPRESSION: 1.  Endotracheal tube and NG tube in stable position.

2. Findings consistent congestive heart failure bilateral
interstitial edema small pleural effusions. No interim change from
prior exam.

## 2020-01-03 IMAGING — CR DG CHEST 1V PORT
1 series · 1 of 1 positions shown · non-contrast
Comparison: August 17, 2017

CLINICAL DATA: Hypoxia

EXAM:
PORTABLE CHEST 1 VIEW

[ap portable]
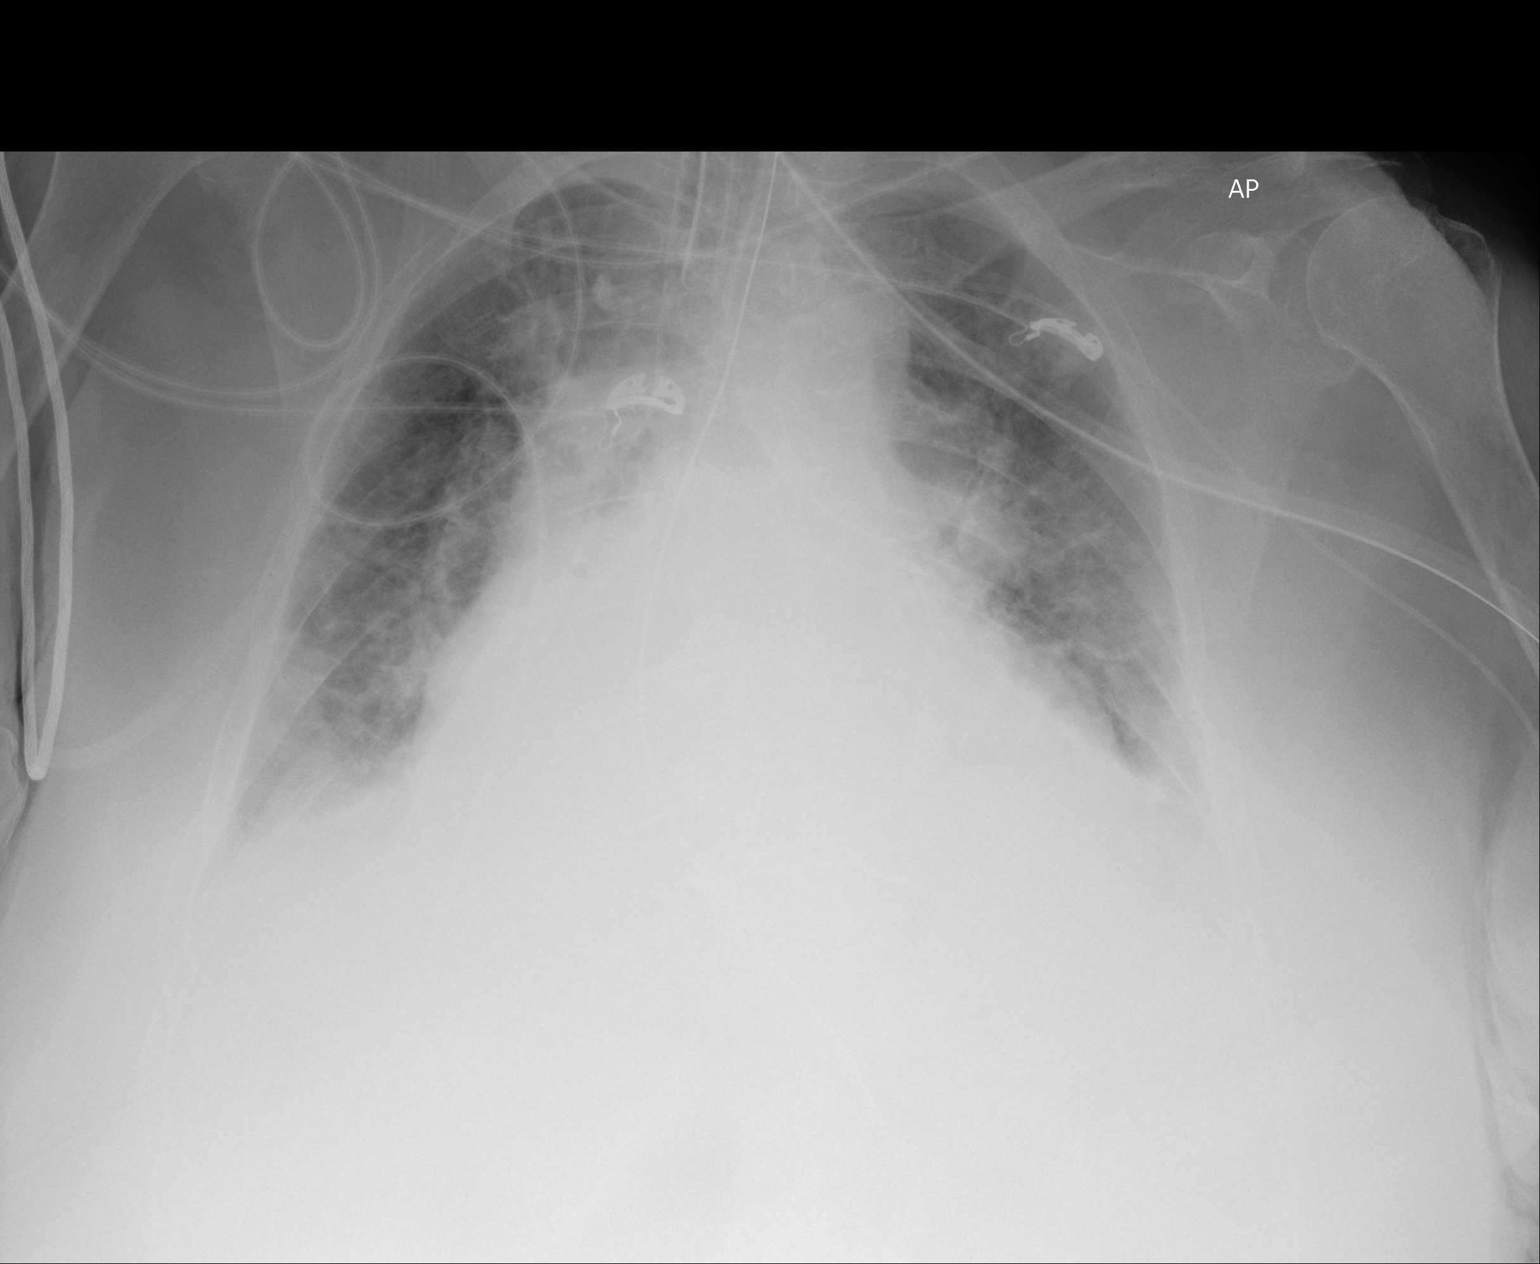

[1 of 1 positions shown; findings below may reference images not displayed]

FINDINGS: Endotracheal tube tip is 3.5 cm above the carina. Nasogastric tube
tip and side port are below the diaphragm in the stomach. No
pneumothorax. There are small pleural effusions bilaterally. There
is interstitial pulmonary edema. There is atelectatic change in both
lung bases.

There is cardiomegaly with pulmonary venous hypertension. There is
aortic atherosclerosis. No adenopathy. No bone lesions.
IMPRESSION: Tube positions as described without pneumothorax. Pulmonary vascular
congestion with interstitial edema and small pleural effusions
bilaterally. Appearance radiographically is indicative of congestive
heart failure. There is bibasilar atelectasis.

## 2020-01-04 IMAGING — CR DG CHEST 1V
1 series · 1 of 1 positions shown · non-contrast
Comparison: 08/18/2017

CLINICAL DATA: Respiratory failure

EXAM:
CHEST 1 VIEW

[ap]
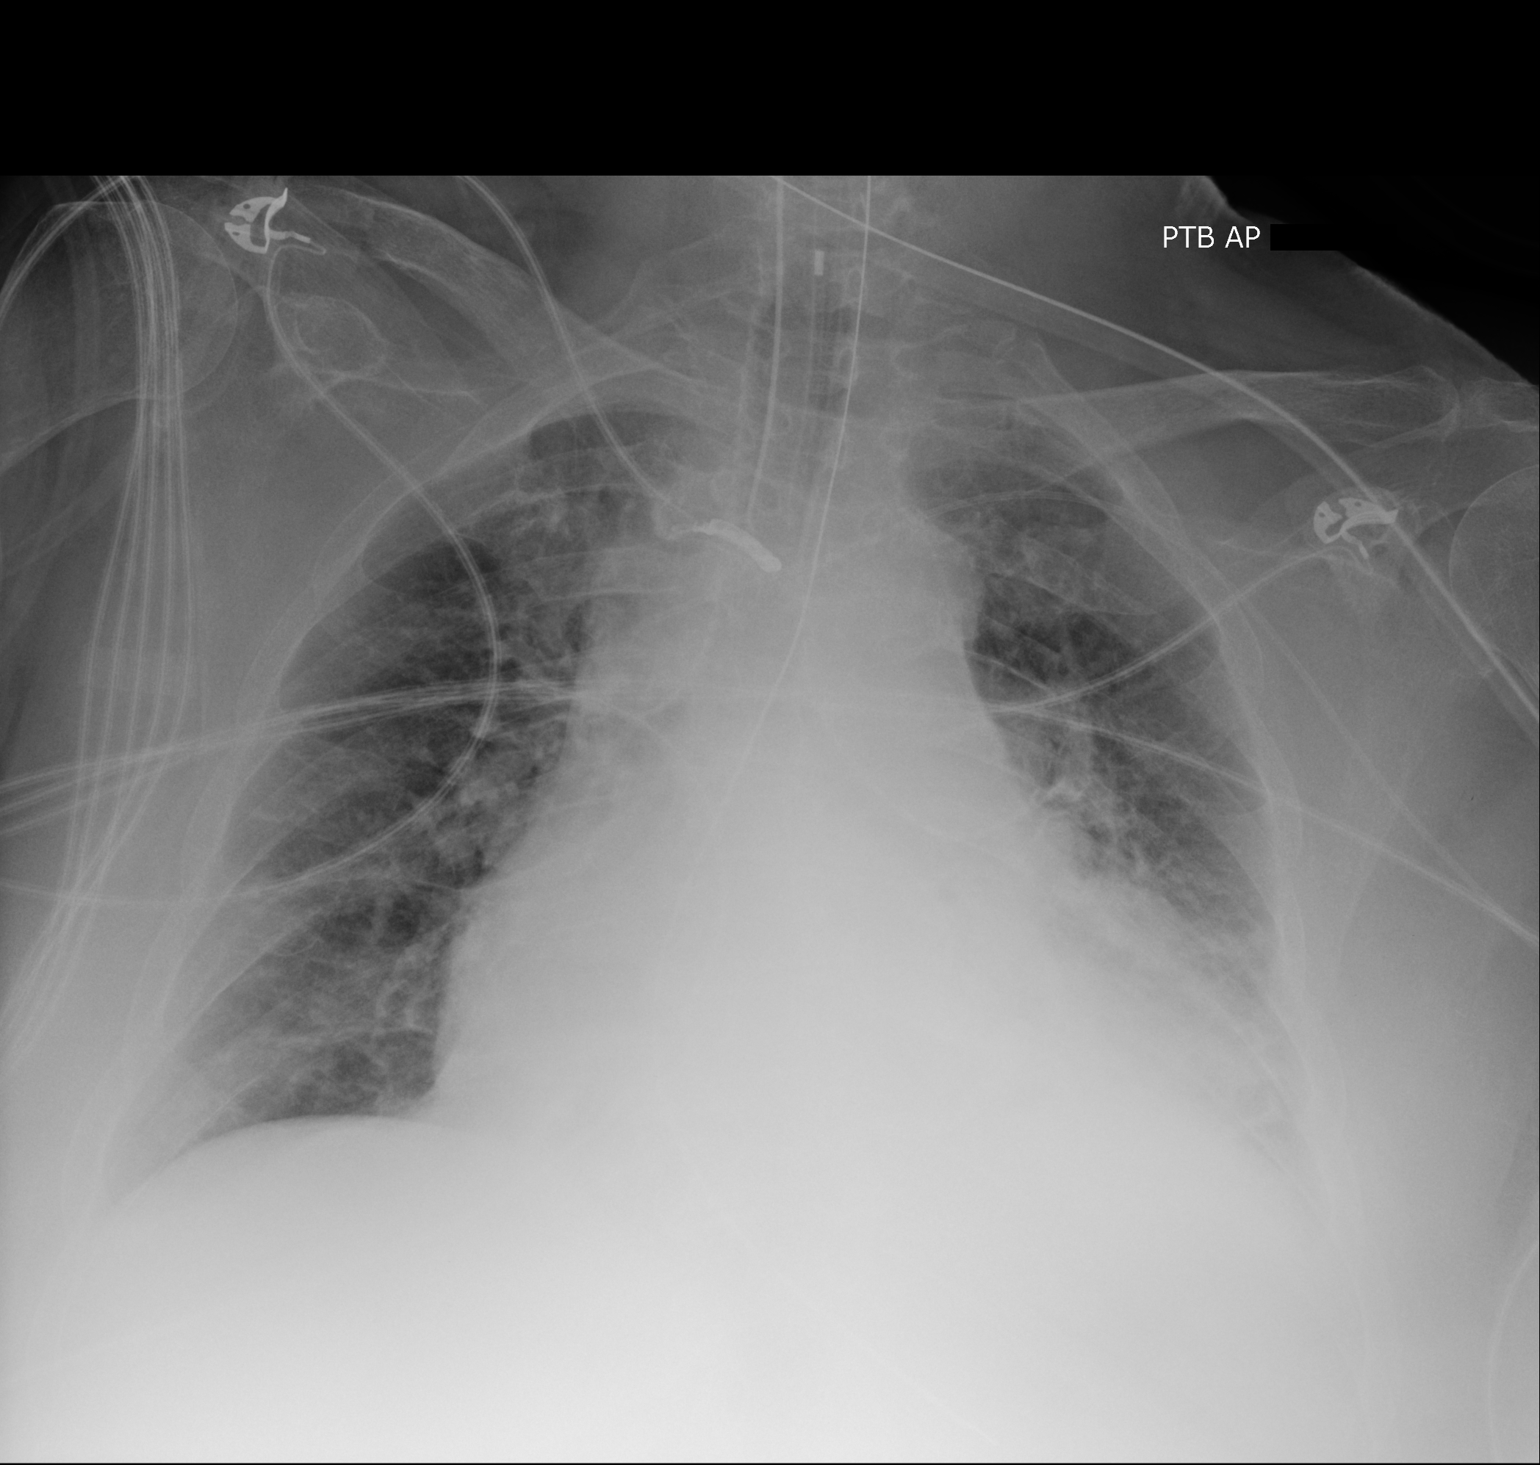

[1 of 1 positions shown; findings below may reference images not displayed]

FINDINGS: Endotracheal tube in good position. NG tube remains in place. Left
arm PICC tip in the proximal SVC.

Cardiac enlargement with improvement in pulmonary vascular
congestion. Negative for edema.

Improvement in bibasilar atelectasis.
IMPRESSION: Improvement in congestive heart failure and bibasilar atelectasis.

## 2020-01-06 IMAGING — CR DG CHEST 1V PORT SAME DAY
1 series · 1 of 1 positions shown · non-contrast
Comparison: Earlier same day

CLINICAL DATA: Orogastric placement.

EXAM:
PORTABLE CHEST 1 VIEW

[portable]
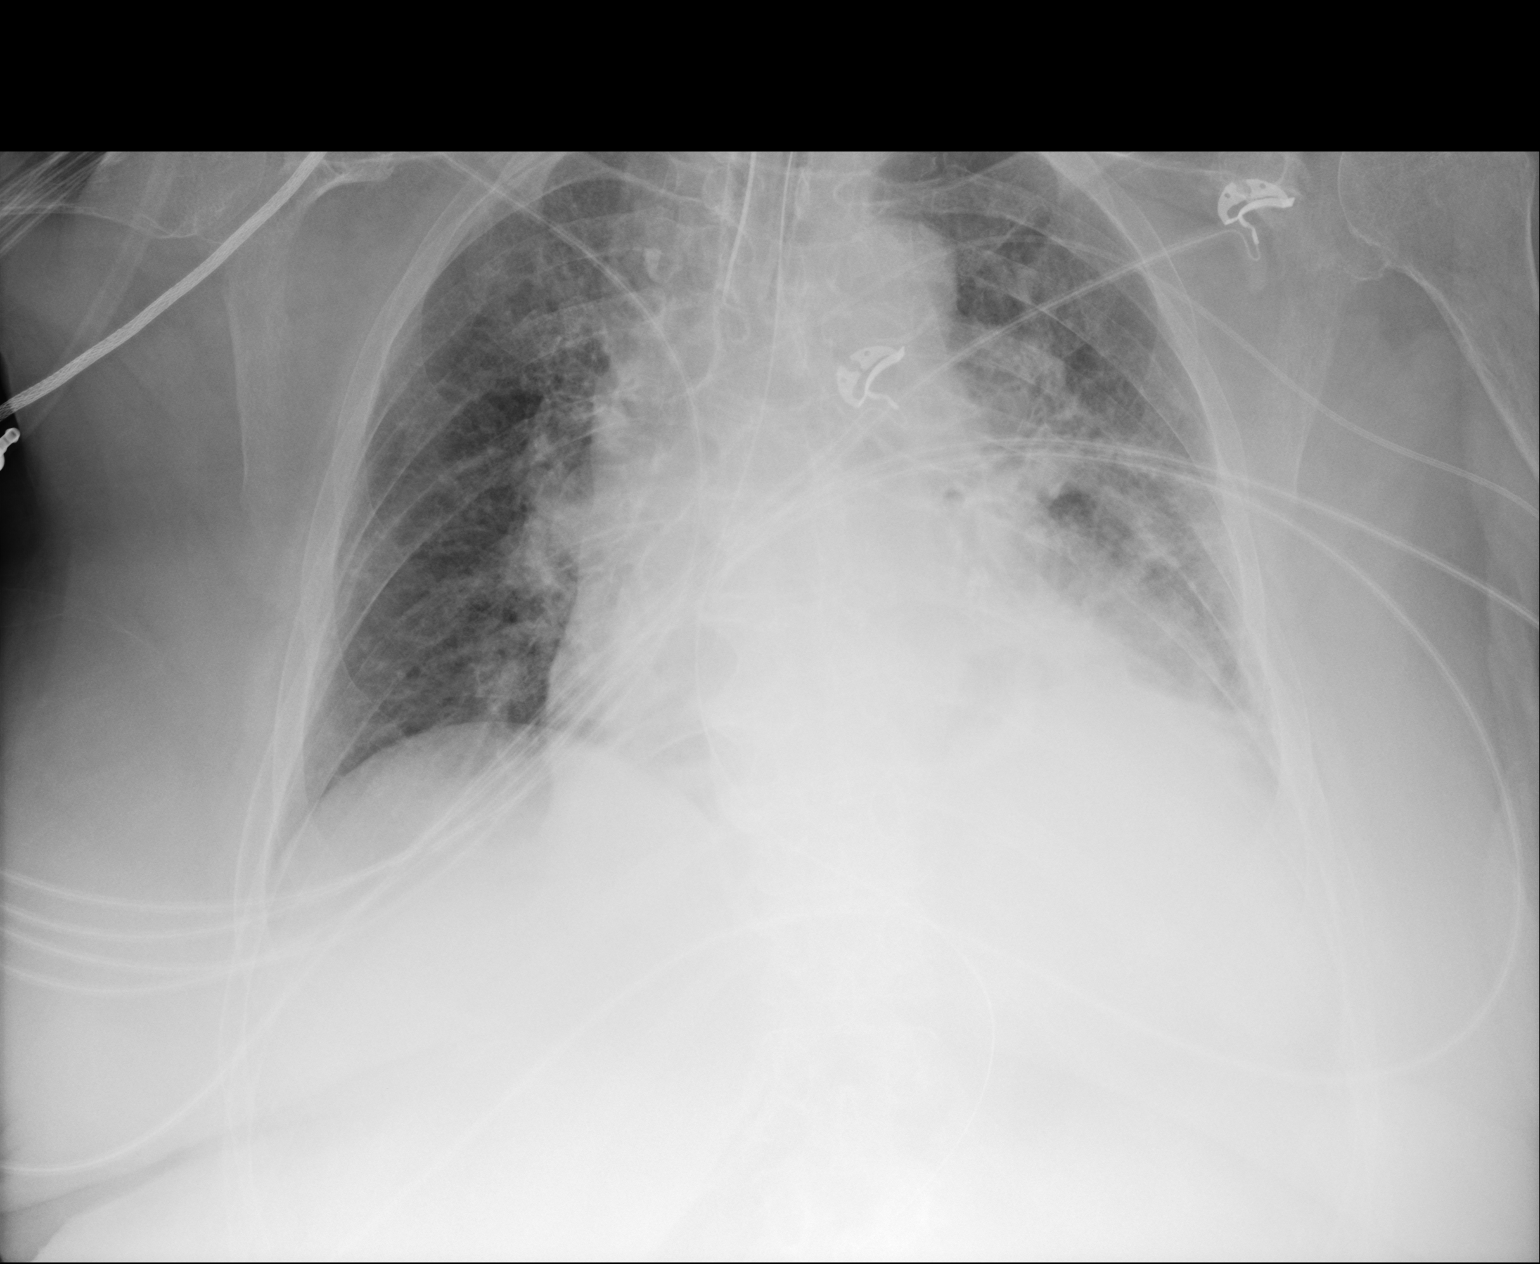

[1 of 1 positions shown; findings below may reference images not displayed]

FINDINGS: Endotracheal tube tip is 2 cm above the carina. Orogastric tube
enters the stomach with its tip at least in the mid body of the
stomach. Left arm PICC tip is at the SVC innominate junction.
Cardiomegaly persists with mild edema, and volume loss in the left
lower lobe
IMPRESSION: Left arm PICC tip at the SVC innominate junction. Endotracheal tube
and nasogastric tube well positioned. Persistent mild edema and left
lower lobe volume loss.

## 2020-01-06 IMAGING — CR DG CHEST 1V PORT
1 series · 1 of 1 positions shown · non-contrast
Comparison: 08/19/2017

CLINICAL DATA: Respiratory failure

EXAM:
PORTABLE CHEST 1 VIEW

[portable]
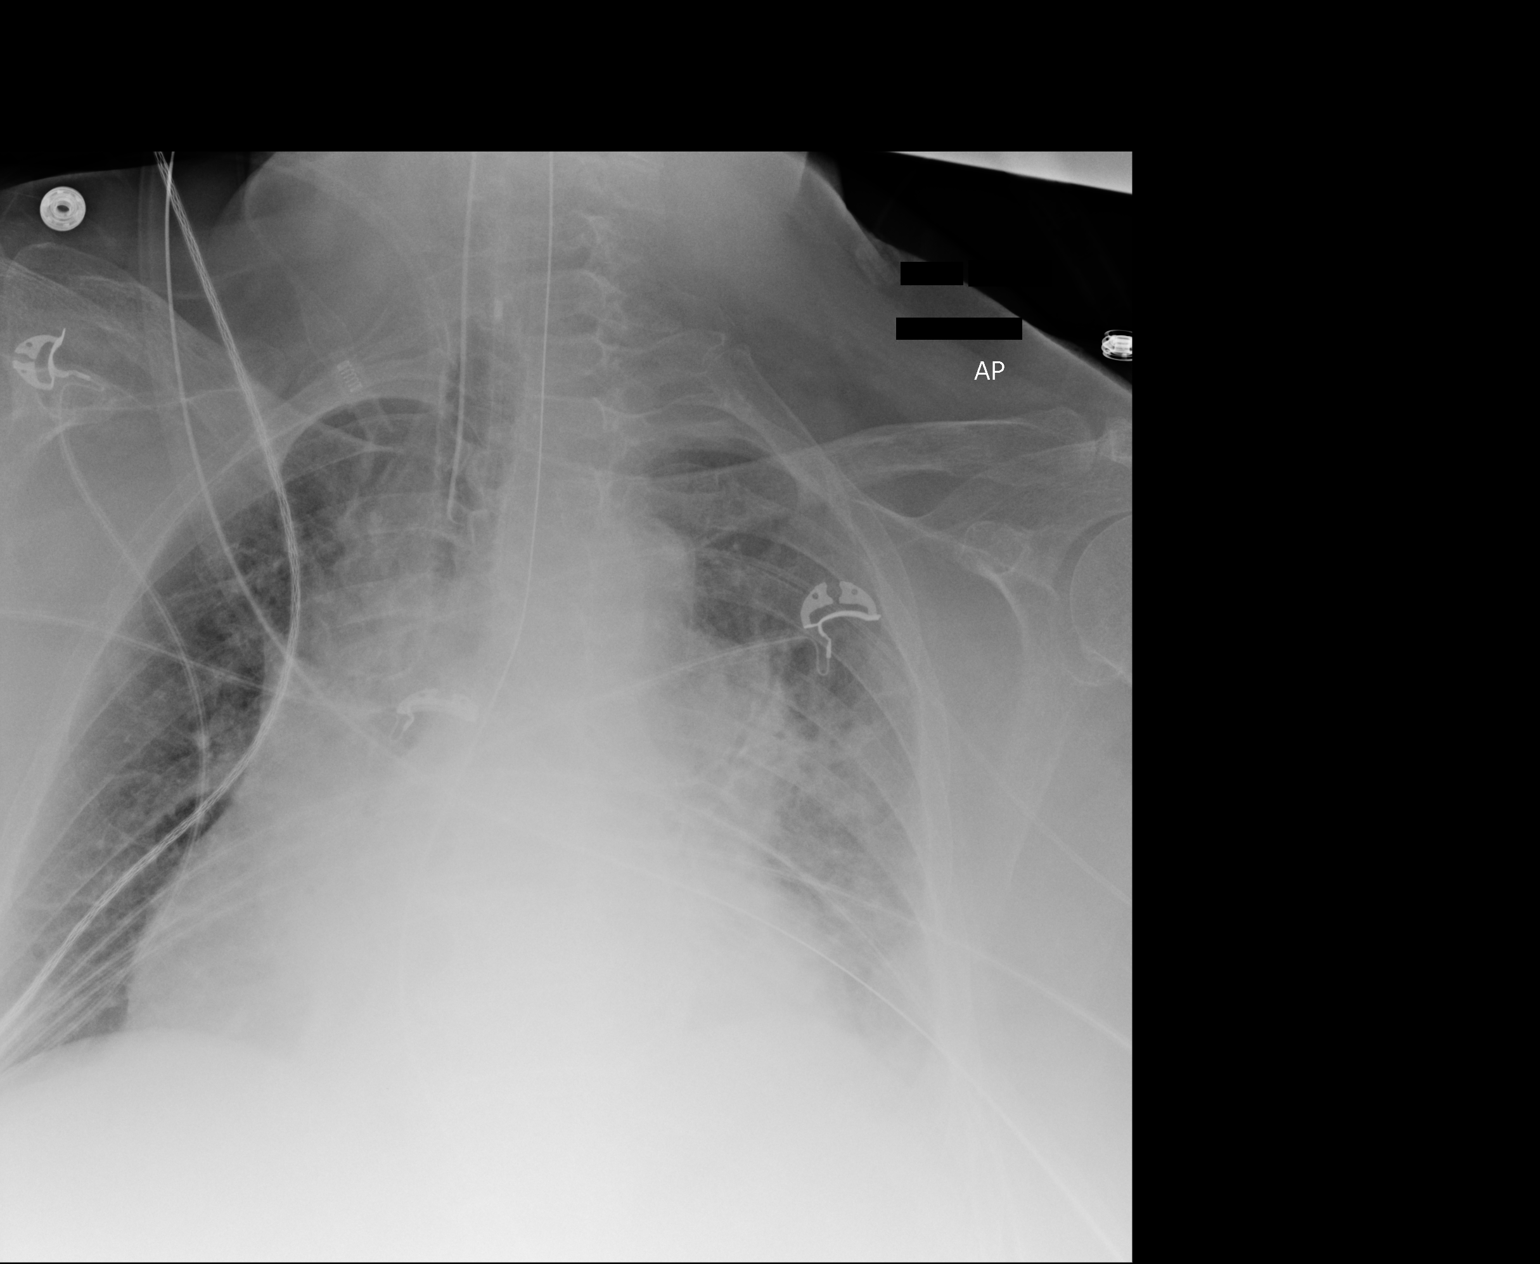

[1 of 1 positions shown; findings below may reference images not displayed]

FINDINGS: Endotracheal tube in good position.  NG tube enters the stomach.

Left lower lobe airspace disease unchanged. Right lung remains
clear. Cardiac enlargement without heart failure.
IMPRESSION: Endotracheal tube remains in good position. Left lower lobe
atelectasis/infiltrate unchanged.

## 2020-01-07 DIAGNOSIS — M545 Low back pain: Secondary | ICD-10-CM | POA: Diagnosis not present

## 2020-01-07 IMAGING — DX DG CHEST 1V PORT
1 series · 1 of 1 positions shown · non-contrast
Comparison: 08/21/2017, 08/18/2017, 08/16/2017

CLINICAL DATA: ET and NG placement

EXAM:
PORTABLE CHEST 1 VIEW

[chest]
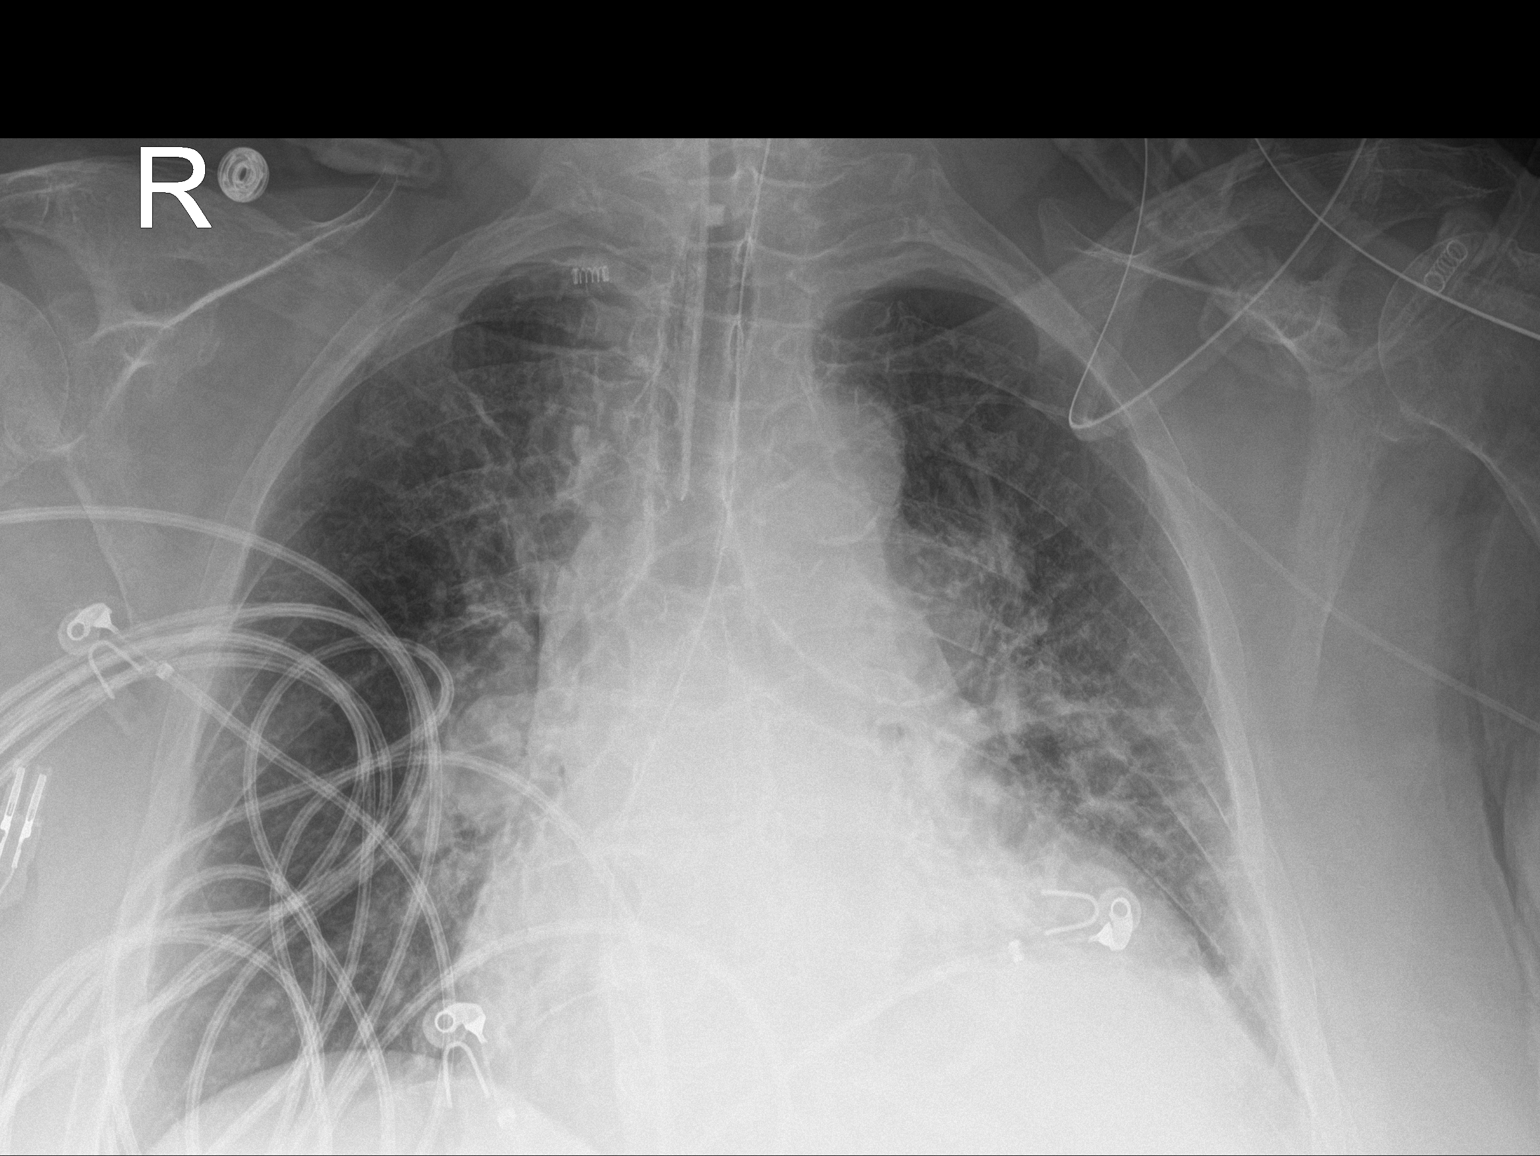

[1 of 1 positions shown; findings below may reference images not displayed]

FINDINGS: Endotracheal tube tip is about 2 cm superior to carina. Esophageal
tube courses toward diaphragm but is poorly visualized on chest
radiograph. Left upper extremity catheter tip difficult to
visualize, appears to overlie the brachiocephalic confluence.

Cardiomegaly with small left pleural effusion. Residual atelectasis
or pneumonia at the left base but overall improving aeration
compared to radiographs from several days prior. Vascular
congestion.
IMPRESSION: 1. Endotracheal tube tip about 2 cm superior to carina
2. Cardiomegaly with continued vascular congestion and small left
pleural effusion
3. Residual airspace disease at the left lung base, slowly improving

## 2020-01-07 IMAGING — DX DG ABD PORTABLE 1V
1 series · 1 of 1 positions shown · non-contrast
Comparison: CT 08/25/2014, serial chest radiographs

CLINICAL DATA: NG tube placement

EXAM:
PORTABLE ABDOMEN - 1 VIEW

[abdomen]
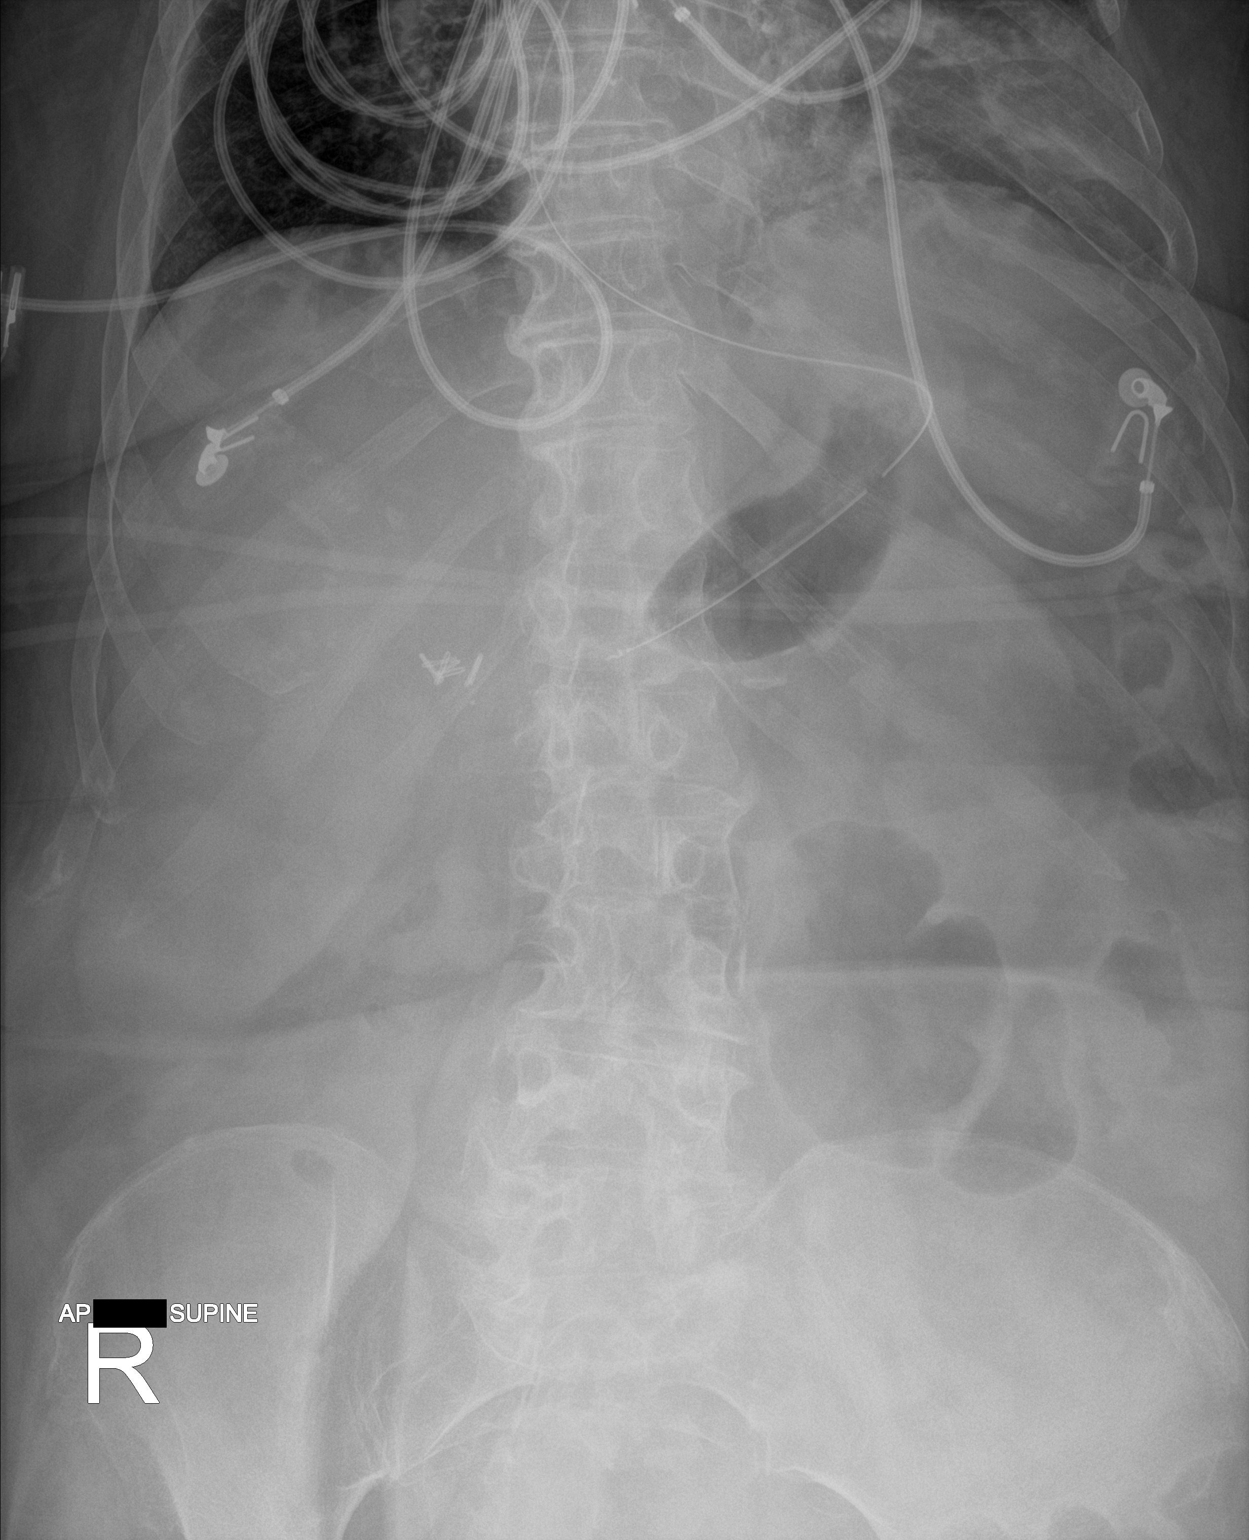

[1 of 1 positions shown; findings below may reference images not displayed]

FINDINGS: Airspace disease at the left base. Esophageal tube tip overlies the
distal stomach. Surgical clips in the right upper quadrant. Overall
decreased bowel gas in the upper abdomen
IMPRESSION: Esophageal tube tip overlies the distal stomach

## 2020-01-08 DIAGNOSIS — I5033 Acute on chronic diastolic (congestive) heart failure: Secondary | ICD-10-CM | POA: Diagnosis not present

## 2020-01-08 DIAGNOSIS — I480 Paroxysmal atrial fibrillation: Secondary | ICD-10-CM | POA: Diagnosis not present

## 2020-01-08 LAB — COMPREHENSIVE METABOLIC PANEL
AG Ratio: 1.7 (calc) (ref 1.0–2.5)
ALT: 10 U/L (ref 6–29)
AST: 14 U/L (ref 10–35)
Albumin: 3.5 g/dL — ABNORMAL LOW (ref 3.6–5.1)
Alkaline phosphatase (APISO): 52 U/L (ref 37–153)
BUN/Creatinine Ratio: 19 (calc) (ref 6–22)
BUN: 22 mg/dL (ref 7–25)
CO2: 31 mmol/L (ref 20–32)
Calcium: 8.8 mg/dL (ref 8.6–10.4)
Chloride: 102 mmol/L (ref 98–110)
Creat: 1.13 mg/dL — ABNORMAL HIGH (ref 0.60–0.93)
Globulin: 2.1 g/dL (calc) (ref 1.9–3.7)
Glucose, Bld: 168 mg/dL — ABNORMAL HIGH (ref 65–99)
Potassium: 4.7 mmol/L (ref 3.5–5.3)
Sodium: 141 mmol/L (ref 135–146)
Total Bilirubin: 0.3 mg/dL (ref 0.2–1.2)
Total Protein: 5.6 g/dL — ABNORMAL LOW (ref 6.1–8.1)

## 2020-01-08 LAB — CBC
HCT: 33.8 % — ABNORMAL LOW (ref 35.0–45.0)
Hemoglobin: 10.7 g/dL — ABNORMAL LOW (ref 11.7–15.5)
MCH: 31.1 pg (ref 27.0–33.0)
MCHC: 31.7 g/dL — ABNORMAL LOW (ref 32.0–36.0)
MCV: 98.3 fL (ref 80.0–100.0)
MPV: 12 fL (ref 7.5–12.5)
Platelets: 194 10*3/uL (ref 140–400)
RBC: 3.44 10*6/uL — ABNORMAL LOW (ref 3.80–5.10)
RDW: 12.9 % (ref 11.0–15.0)
WBC: 5.8 10*3/uL (ref 3.8–10.8)

## 2020-01-08 LAB — MAGNESIUM: Magnesium: 2.1 mg/dL (ref 1.5–2.5)

## 2020-01-08 LAB — TSH: TSH: 5.79 mIU/L — ABNORMAL HIGH (ref 0.40–4.50)

## 2020-01-08 IMAGING — DX DG ABD PORTABLE 1V
1 series · 1 of 1 positions shown · non-contrast
Comparison: 08/22/2017

CLINICAL DATA: Orogastric tube placement

EXAM:
PORTABLE ABDOMEN - 1 VIEW

[abdomen kub]
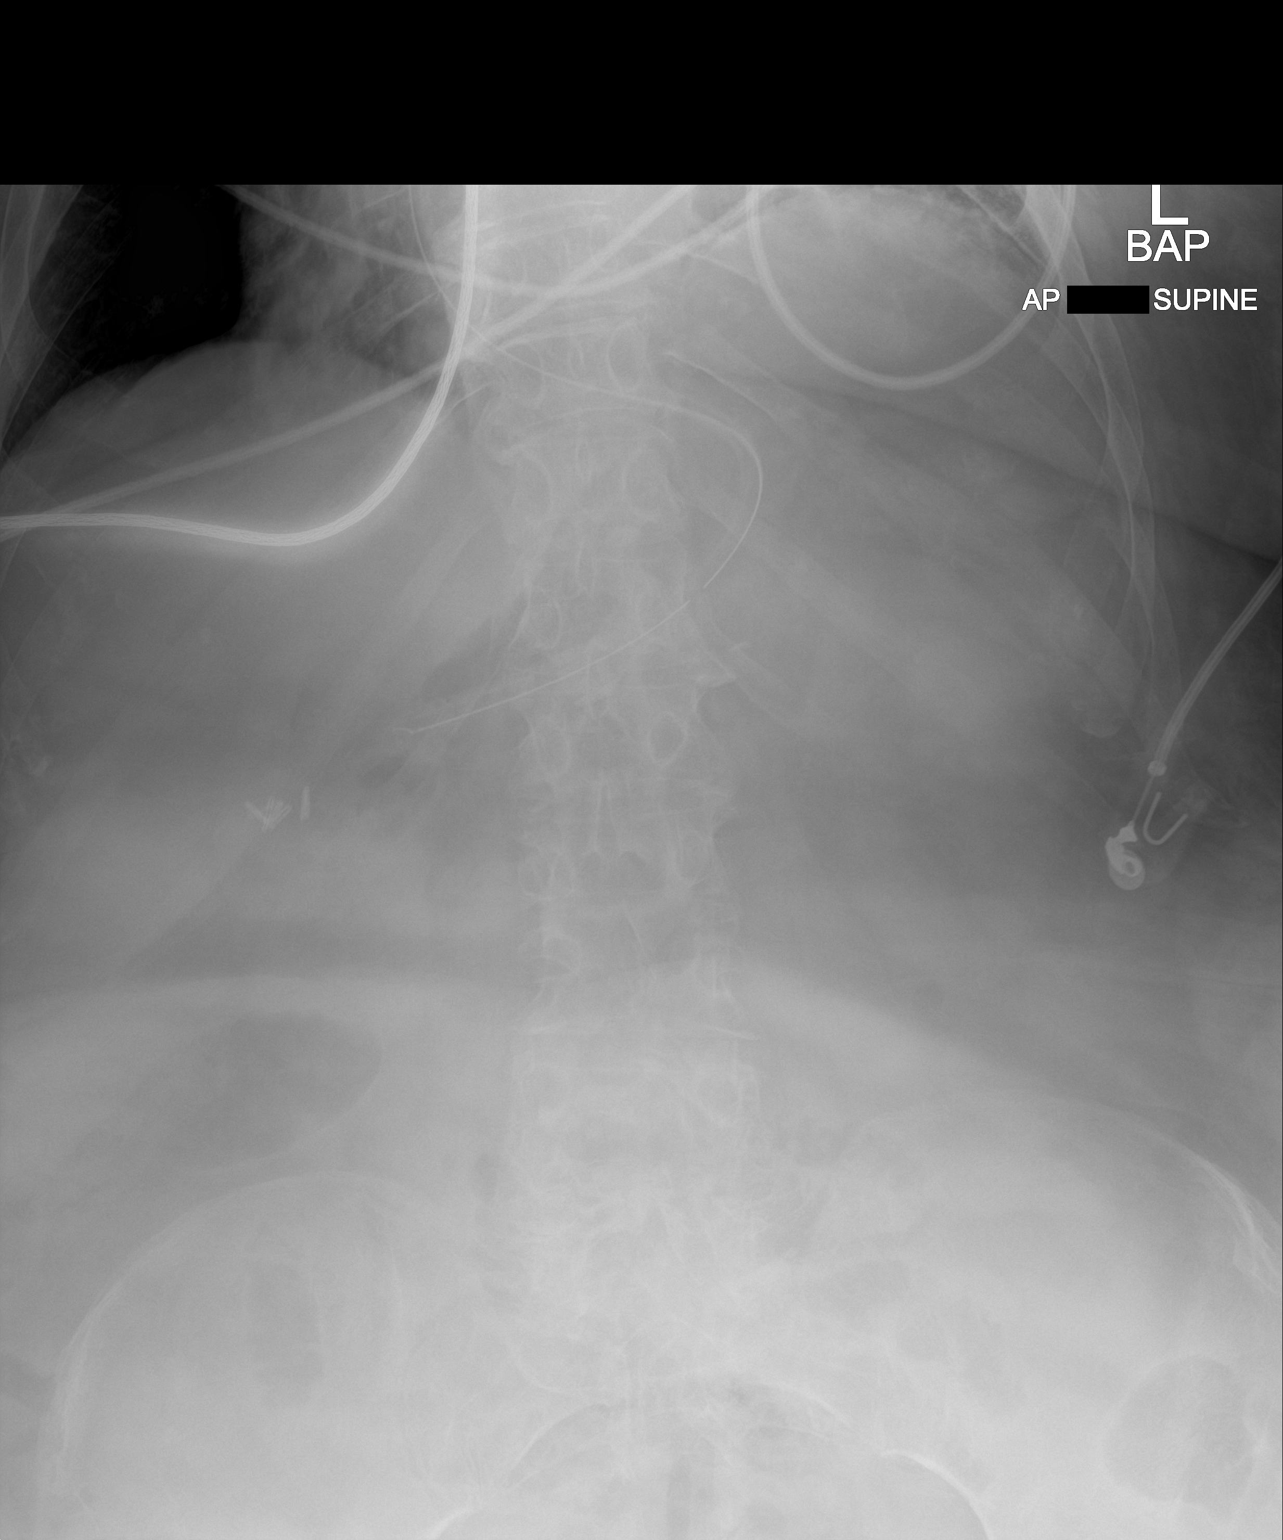

[1 of 1 positions shown; findings below may reference images not displayed]

FINDINGS: The tip and side port of a gastric tube are seen along the greater
curvature of the stomach. The tip projects over the expected
location of the gastric antrum. Cholecystectomy clips are seen in
the right upper quadrant. Bowel gas pattern is unremarkable. There
is cardiomegaly.
IMPRESSION: Gastric tube in the expected location of the stomach with tip in the
region of the gastric antrum.

## 2020-01-09 IMAGING — DX DG CHEST 1V PORT
1 series · 1 of 1 positions shown · non-contrast
Comparison: August 22, 2017

CLINICAL DATA: Hypoxia

EXAM:
PORTABLE CHEST 1 VIEW

[chest]
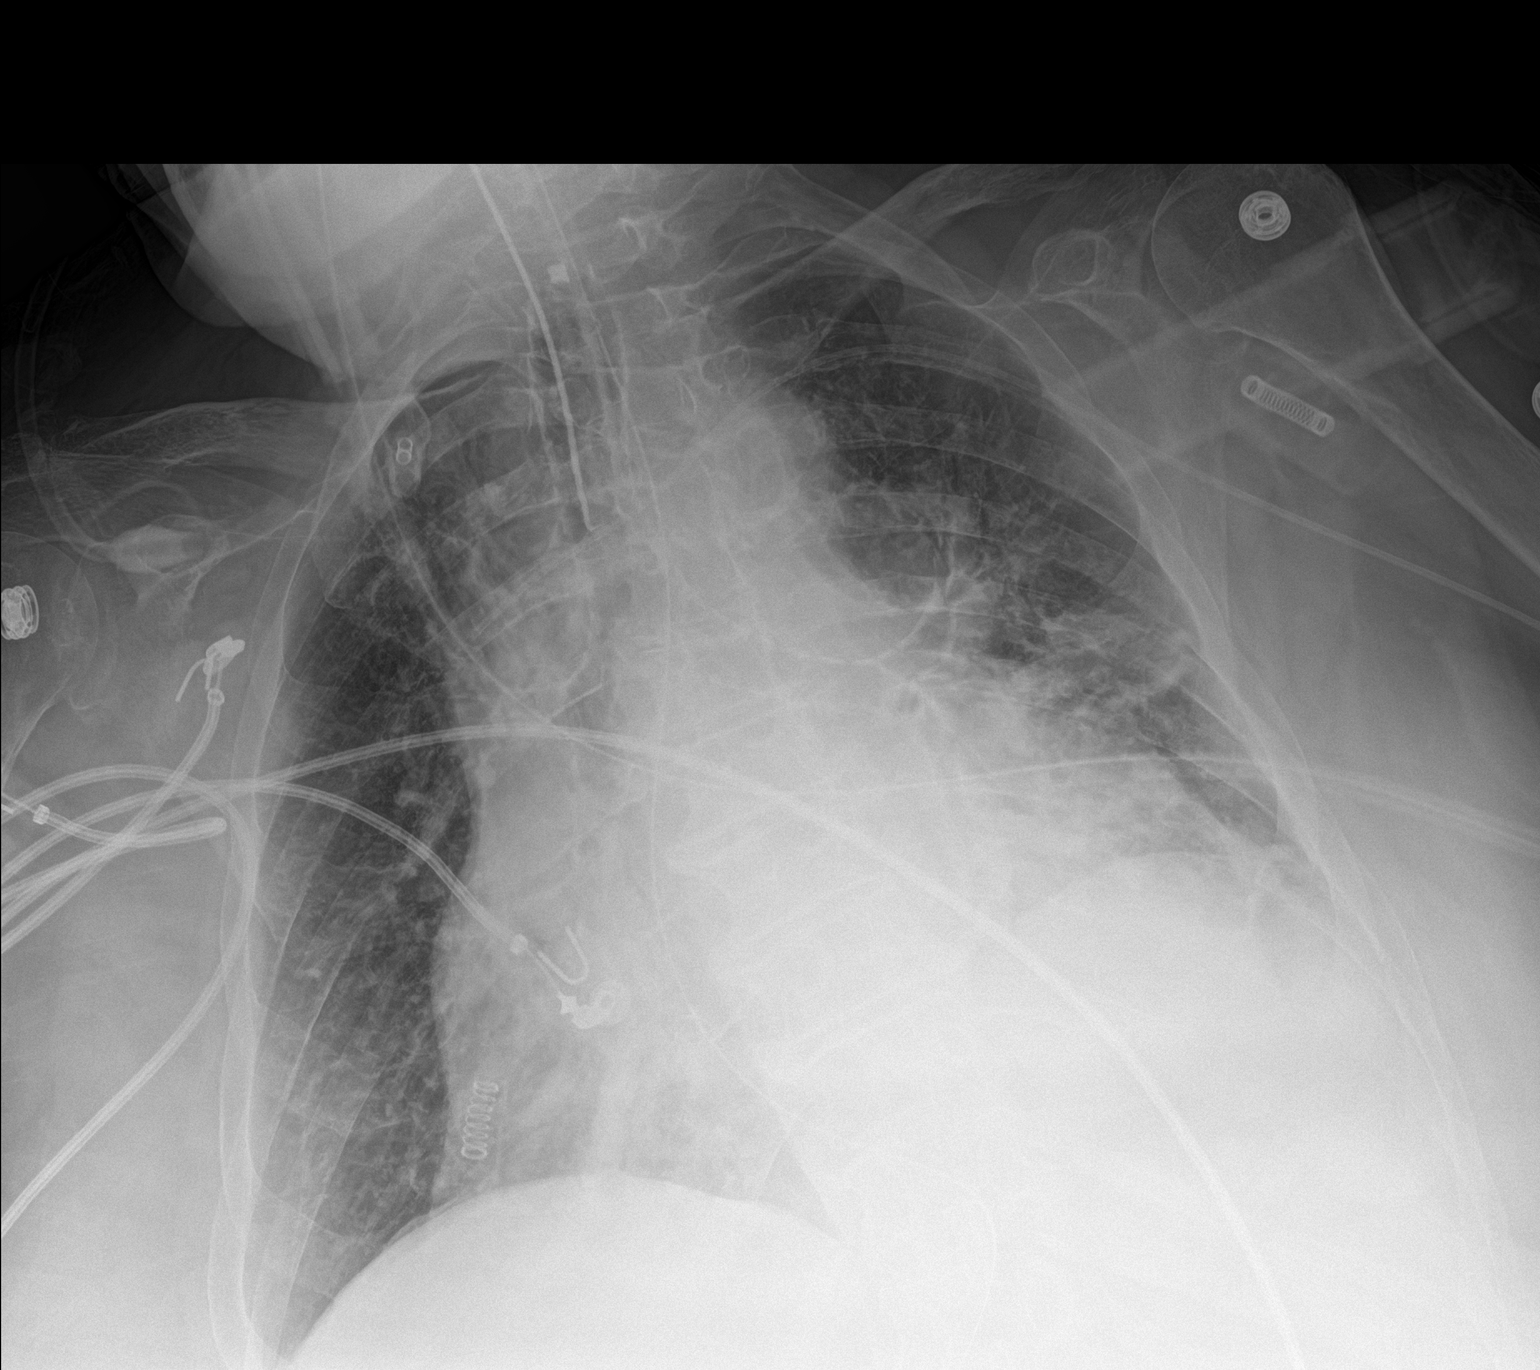

[1 of 1 positions shown; findings below may reference images not displayed]

FINDINGS: Endotracheal tube tip is 3.0 cm above the carina. Nasogastric tube
tip and side port are below the diaphragm. Central catheter tip is
at the junction of the left innominate vein and superior vena cava.
No pneumothorax.

There is patchy airspace consolidation in the left lower lobe
region. The lungs elsewhere are clear. There is cardiomegaly with
pulmonary vascularity within normal limits. No adenopathy evident.
There is aortic atherosclerosis. Bones are osteoporotic.
IMPRESSION: Tube and catheter positions as described without pneumothorax.
Patchy airspace consolidation felt to represent pneumonia left lower
lobe. Lungs elsewhere clear.

There is stable cardiomegaly. There is aortic atherosclerosis. Bones
osteoporotic.

Aortic Atherosclerosis ([id number]).

## 2020-01-10 IMAGING — CR DG ABD PORTABLE 1V
2 series · 2 of 2 positions shown · non-contrast
Comparison: August 23, 2017

CLINICAL DATA: Fever

EXAM:
PORTABLE ABDOMEN - 1 VIEW

[AP (1 of 2)]
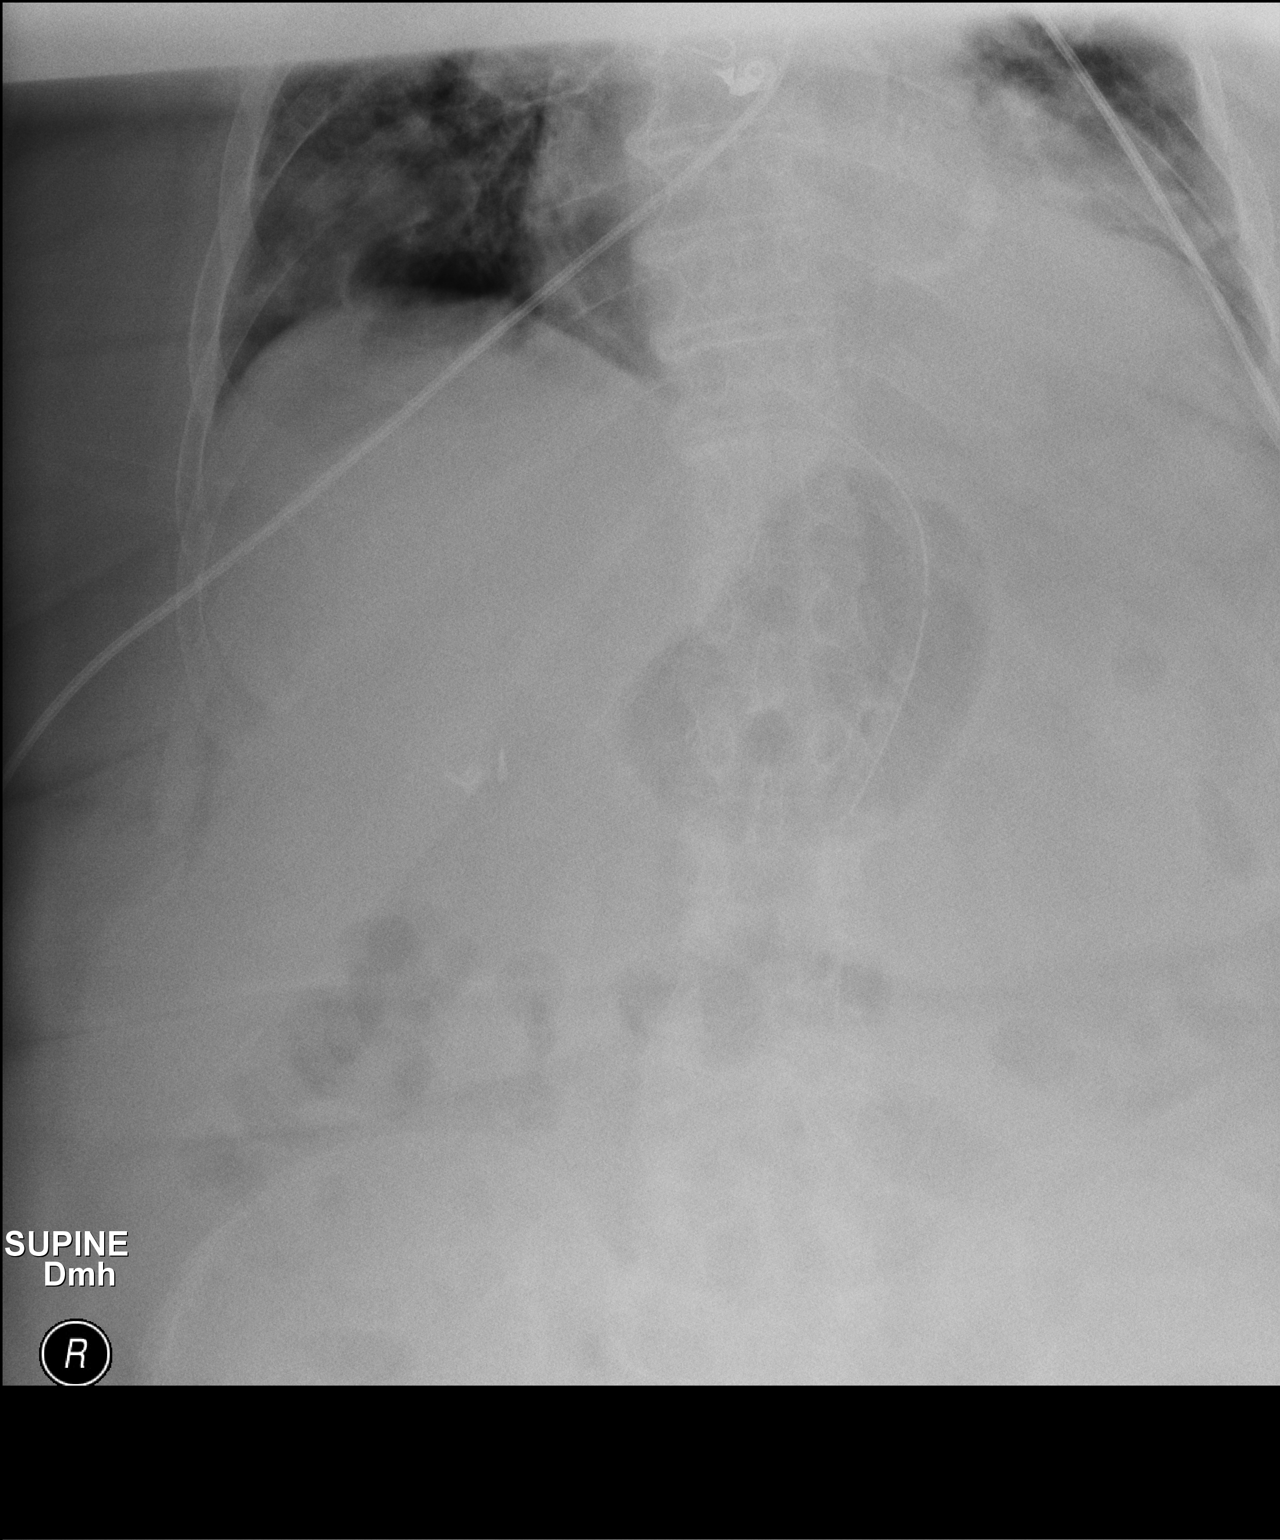

[AP (2 of 2)]
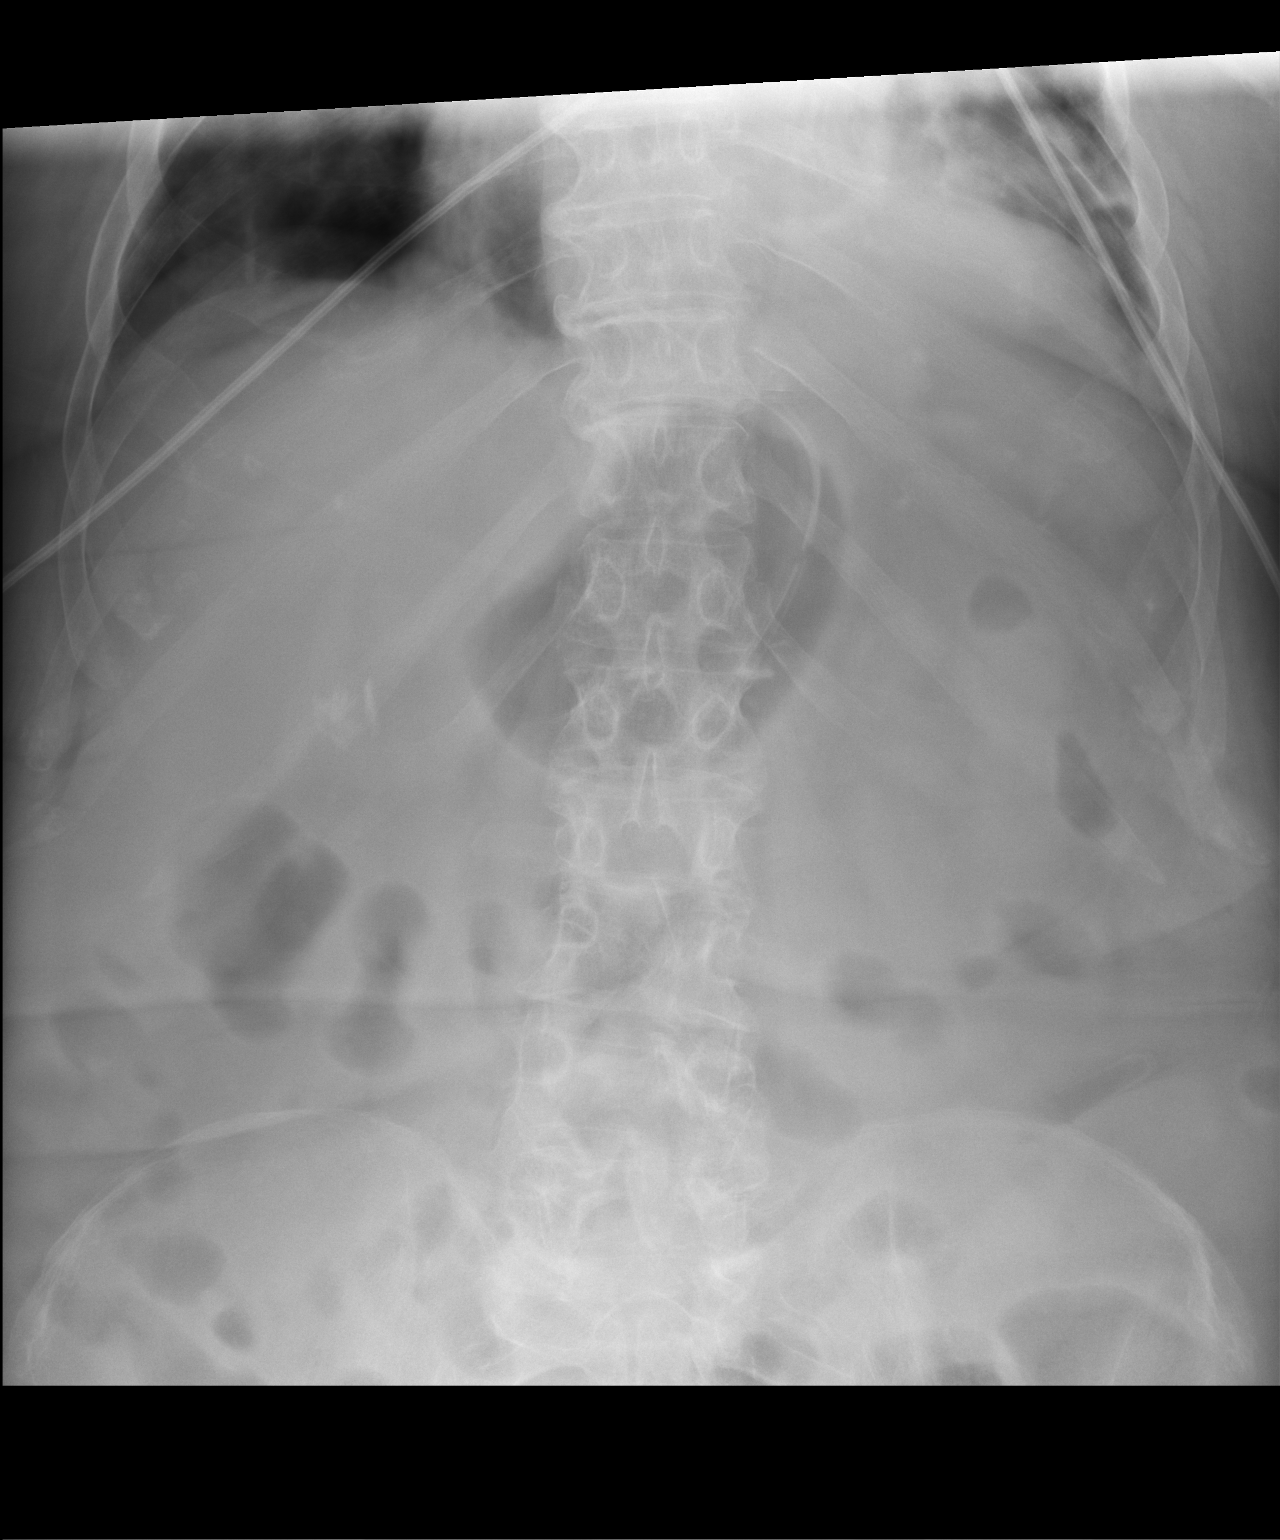

[2 of 2 positions shown; findings below may reference images not displayed]

FINDINGS: Nasogastric tube tip and side port are in the stomach. There is
moderate stool in the colon. There is no bowel dilatation or
air-fluid level to suggest bowel obstruction. No free air. There are
surgical clips in right upper quadrant region. There is bibasilar
atelectasis. There is calcification in the mitral annulus.
IMPRESSION: Nasogastric tube tip and side port in stomach. No bowel obstruction
or free air. Moderate stool in colon.

## 2020-01-10 IMAGING — CR DG CHEST 1V PORT
1 series · 1 of 1 positions shown · non-contrast
Comparison: 08/24/2017

CLINICAL DATA: Fever

EXAM:
PORTABLE CHEST 1 VIEW

[AP]
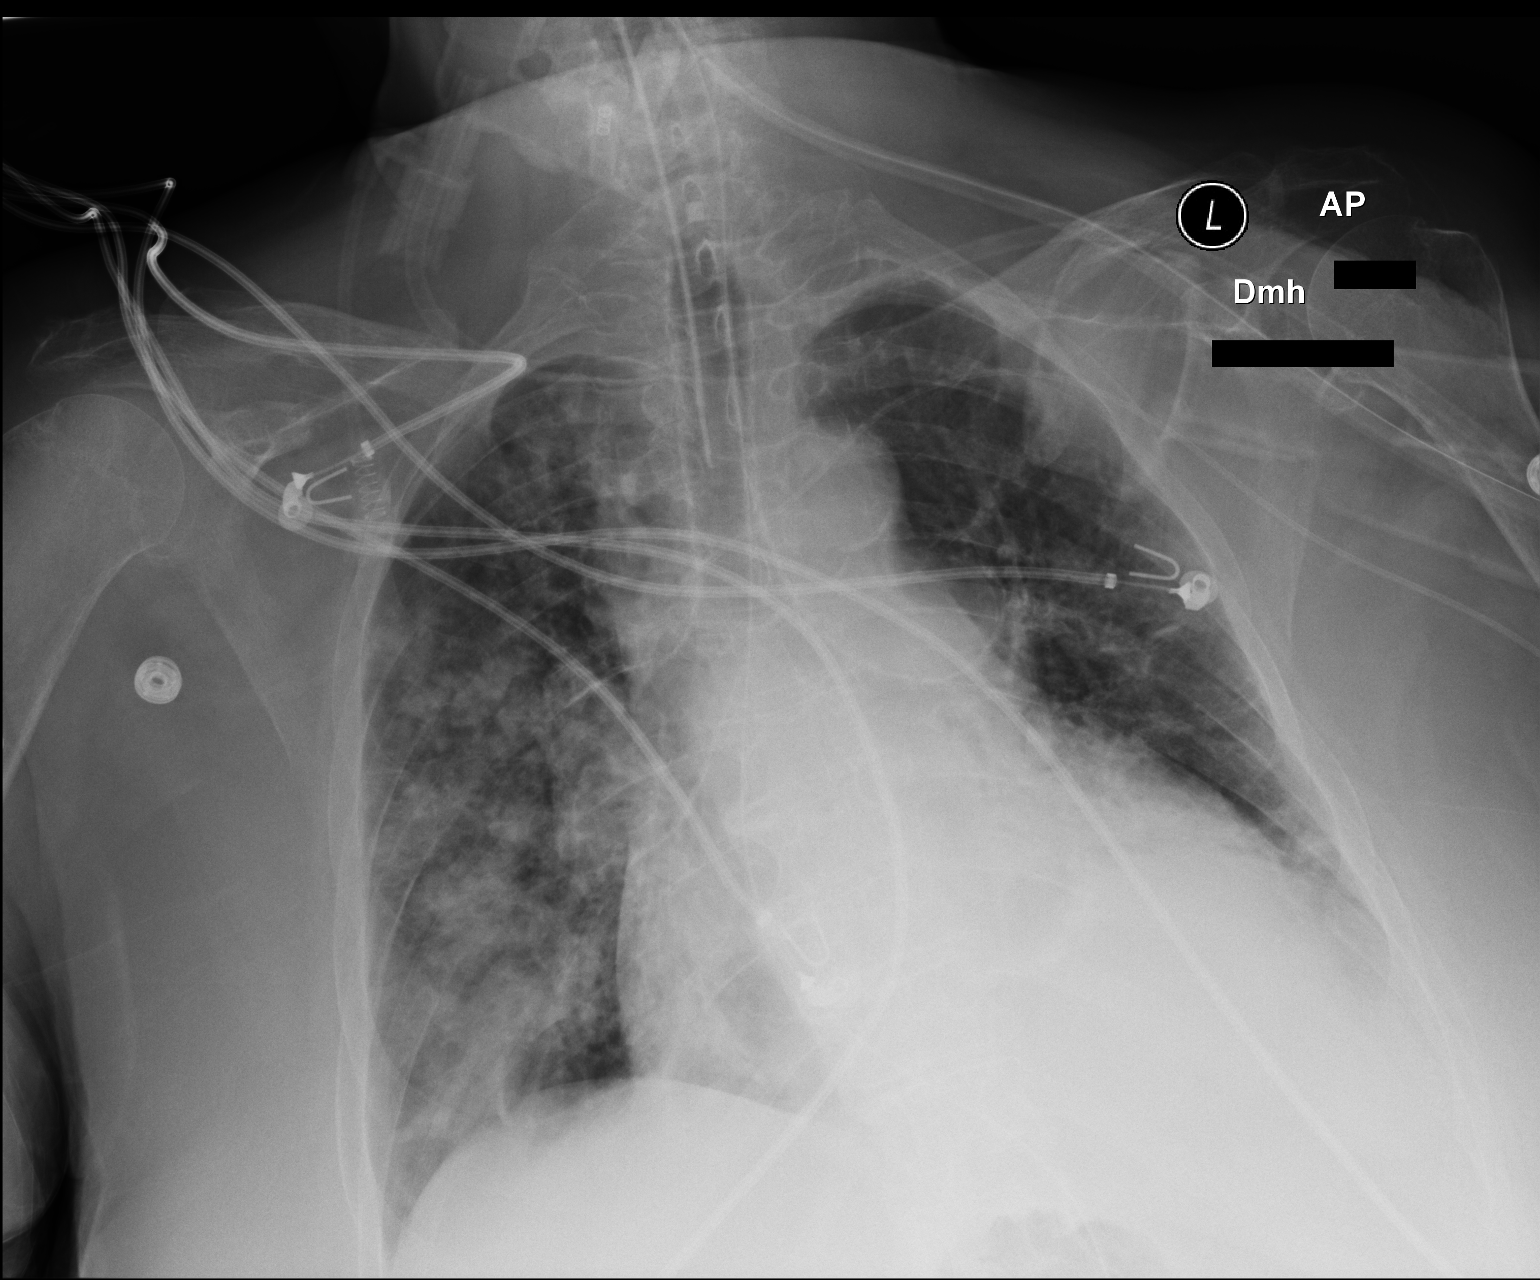

[1 of 1 positions shown; findings below may reference images not displayed]

FINDINGS: Endotracheal and NG tubes are stable. Left arm PICC is stable with
its tip at the upper SVC. Cardiomegaly is stable. Patchy bilateral
airspace opacities are stable in the left lung and worsening
throughout the right lung. No pneumothorax. No pleural effusion.
IMPRESSION: Patchy bilateral airspace opacities are stable on the left but worse
throughout the right lung.

## 2020-01-12 IMAGING — DX DG CHEST 1V PORT
1 series · 1 of 1 positions shown · non-contrast
Comparison: 08/25/2017 chest radiograph.

CLINICAL DATA: Respiratory failure

EXAM:
PORTABLE CHEST 1 VIEW

[chest]
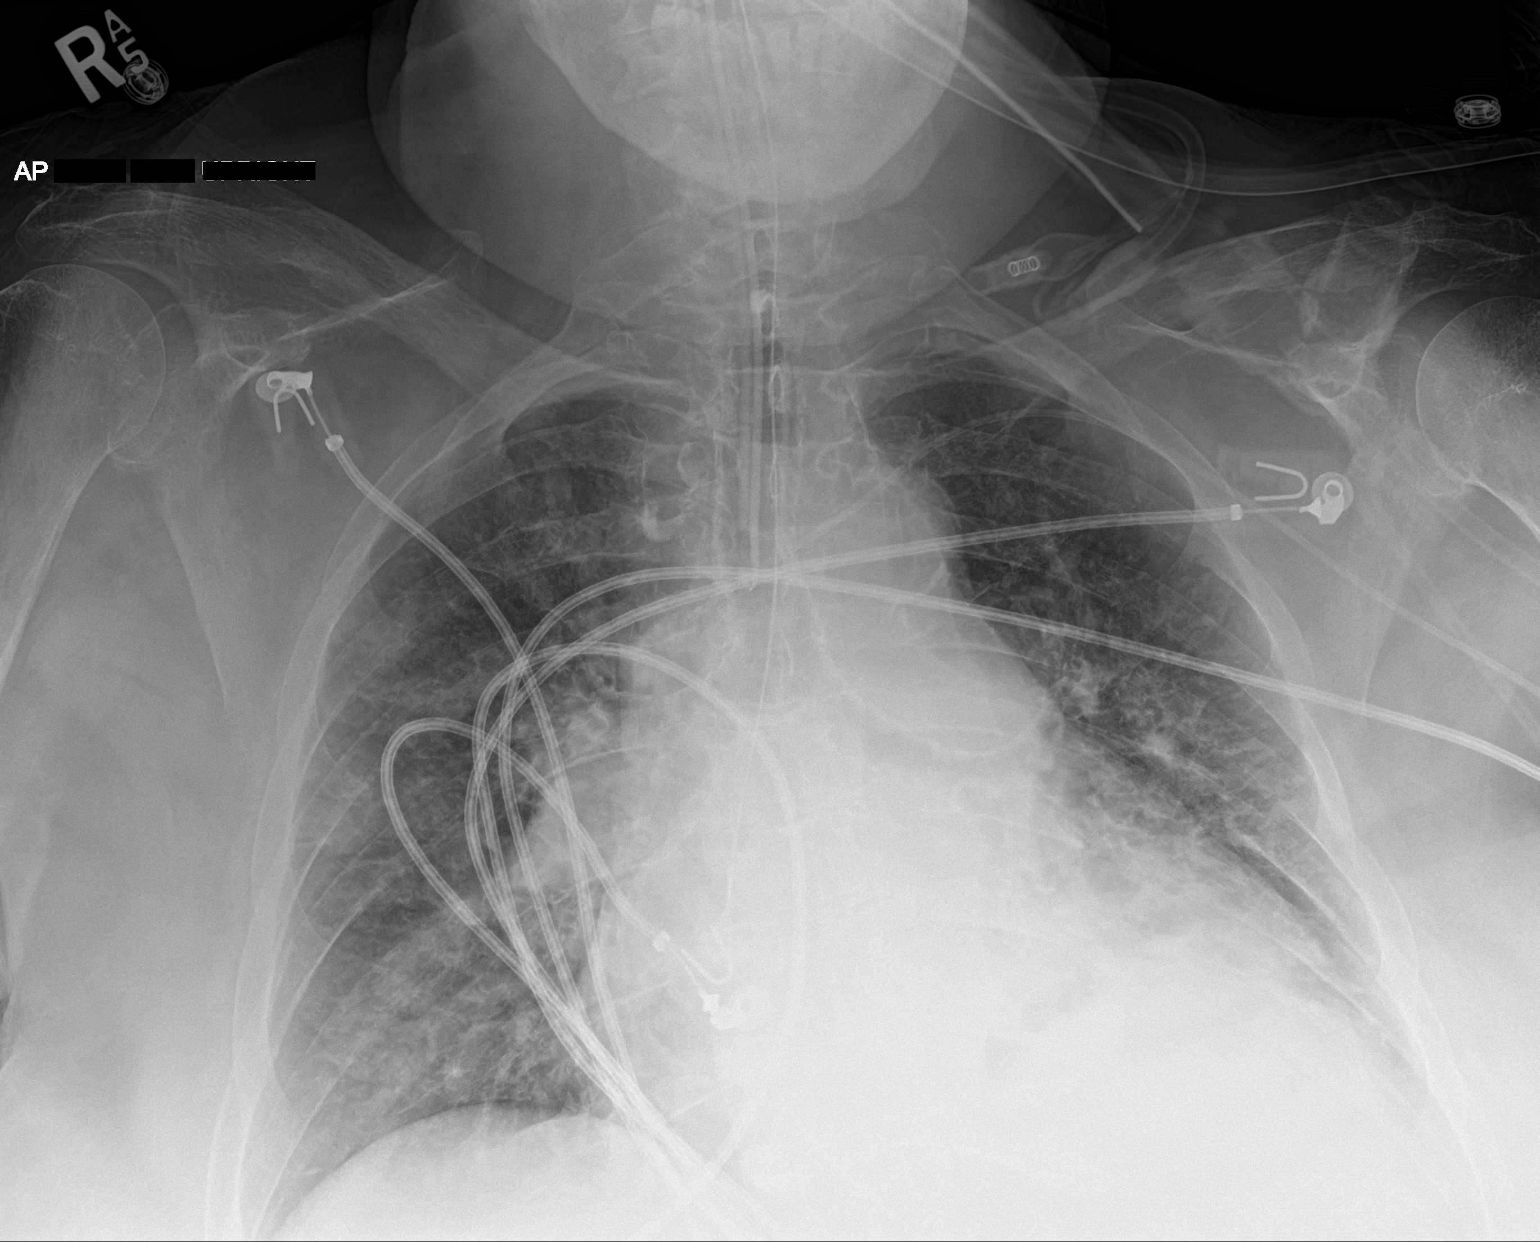

[1 of 1 positions shown; findings below may reference images not displayed]

FINDINGS: Endotracheal tube tip is 2.8 cm above the carina. Left PICC
terminates over the left brachiocephalic vein near the junction with
the superior vena cava. Enteric tube enters stomach with the tip not
seen on this image. Stable cardiomediastinal silhouette with mild
cardiomegaly. No pneumothorax. No significant pleural effusion. Mild
pulmonary edema, decreased. Left basilar atelectasis is stable.
IMPRESSION: 1. Support structures as detailed.
2. Mild congestive heart failure, improved.
3. Stable left basilar atelectasis.

## 2020-01-13 IMAGING — DX DG ABD PORTABLE 1V
1 series · 2 of 2 positions shown · non-contrast
Comparison: Plain film of the abdomen dated 08/25/2017.

CLINICAL DATA: Evaluate for ileus

EXAM:
PORTABLE ABDOMEN - 1 VIEW

[Series 1: abdomen · 0.14mm/px · 2 of 2 slices shown]
[im 1/2]
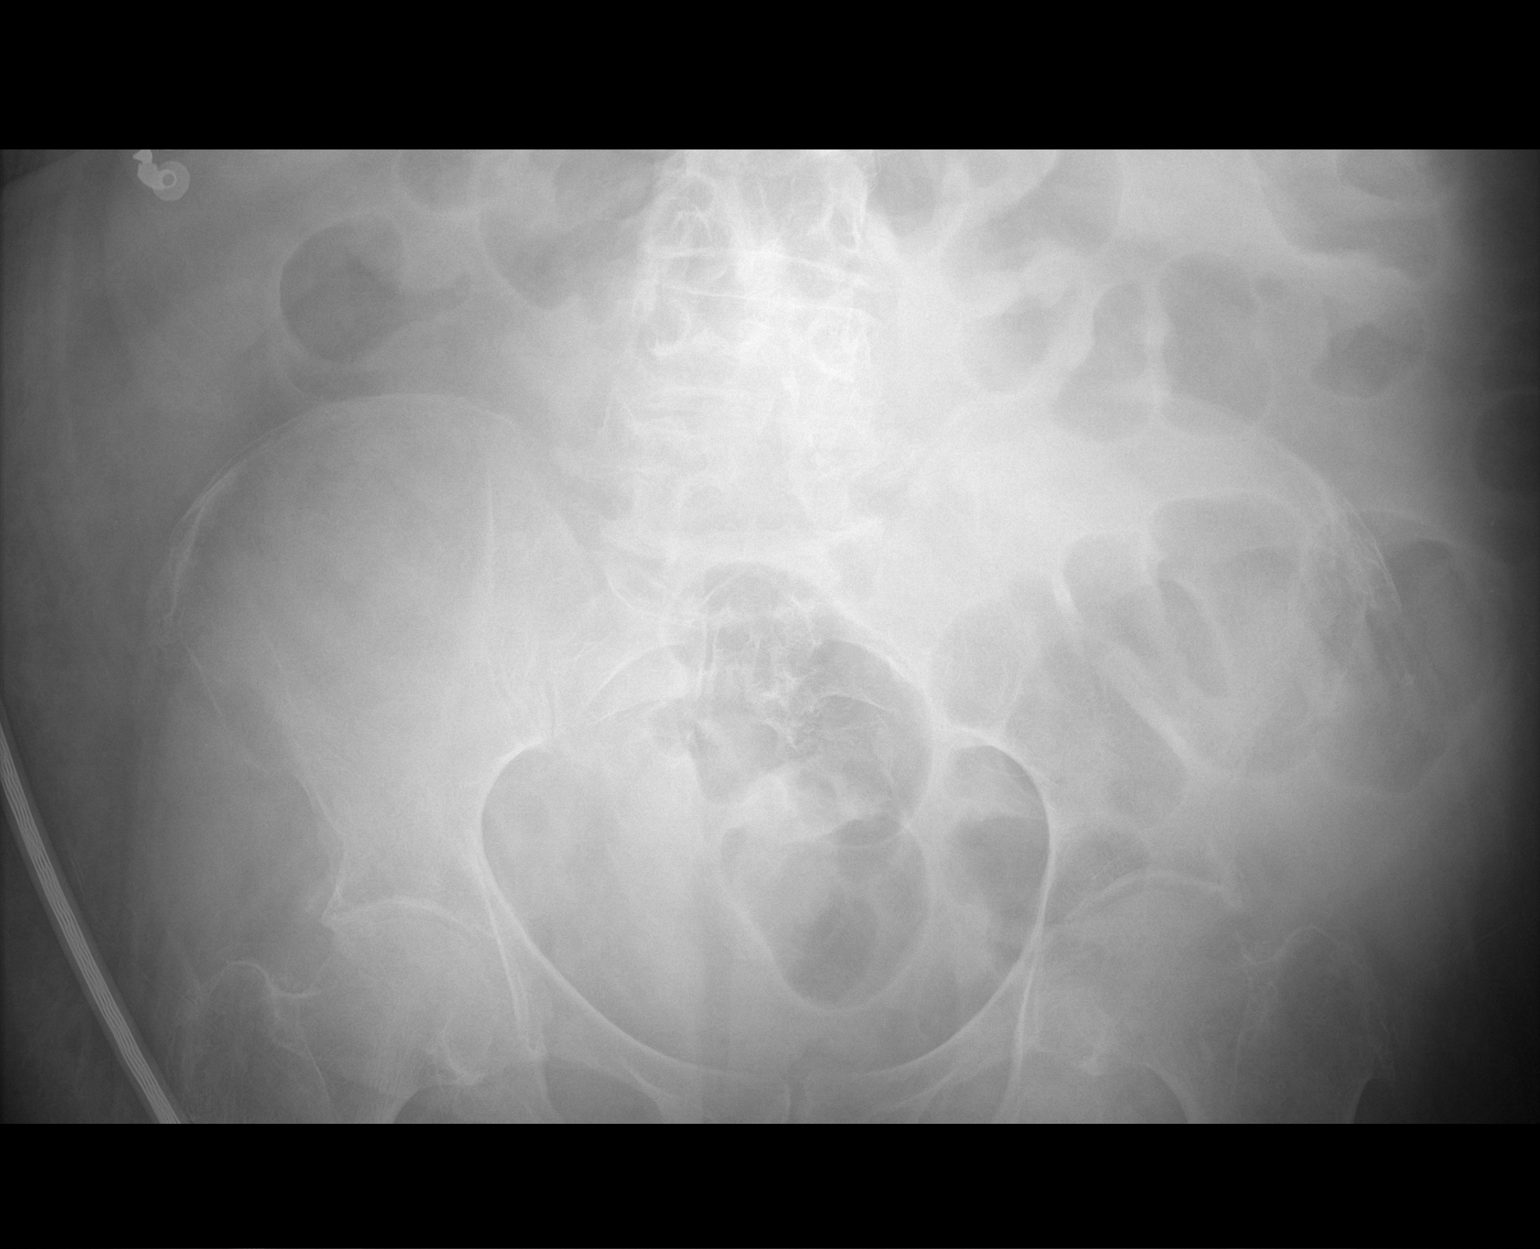
[im 2/2]
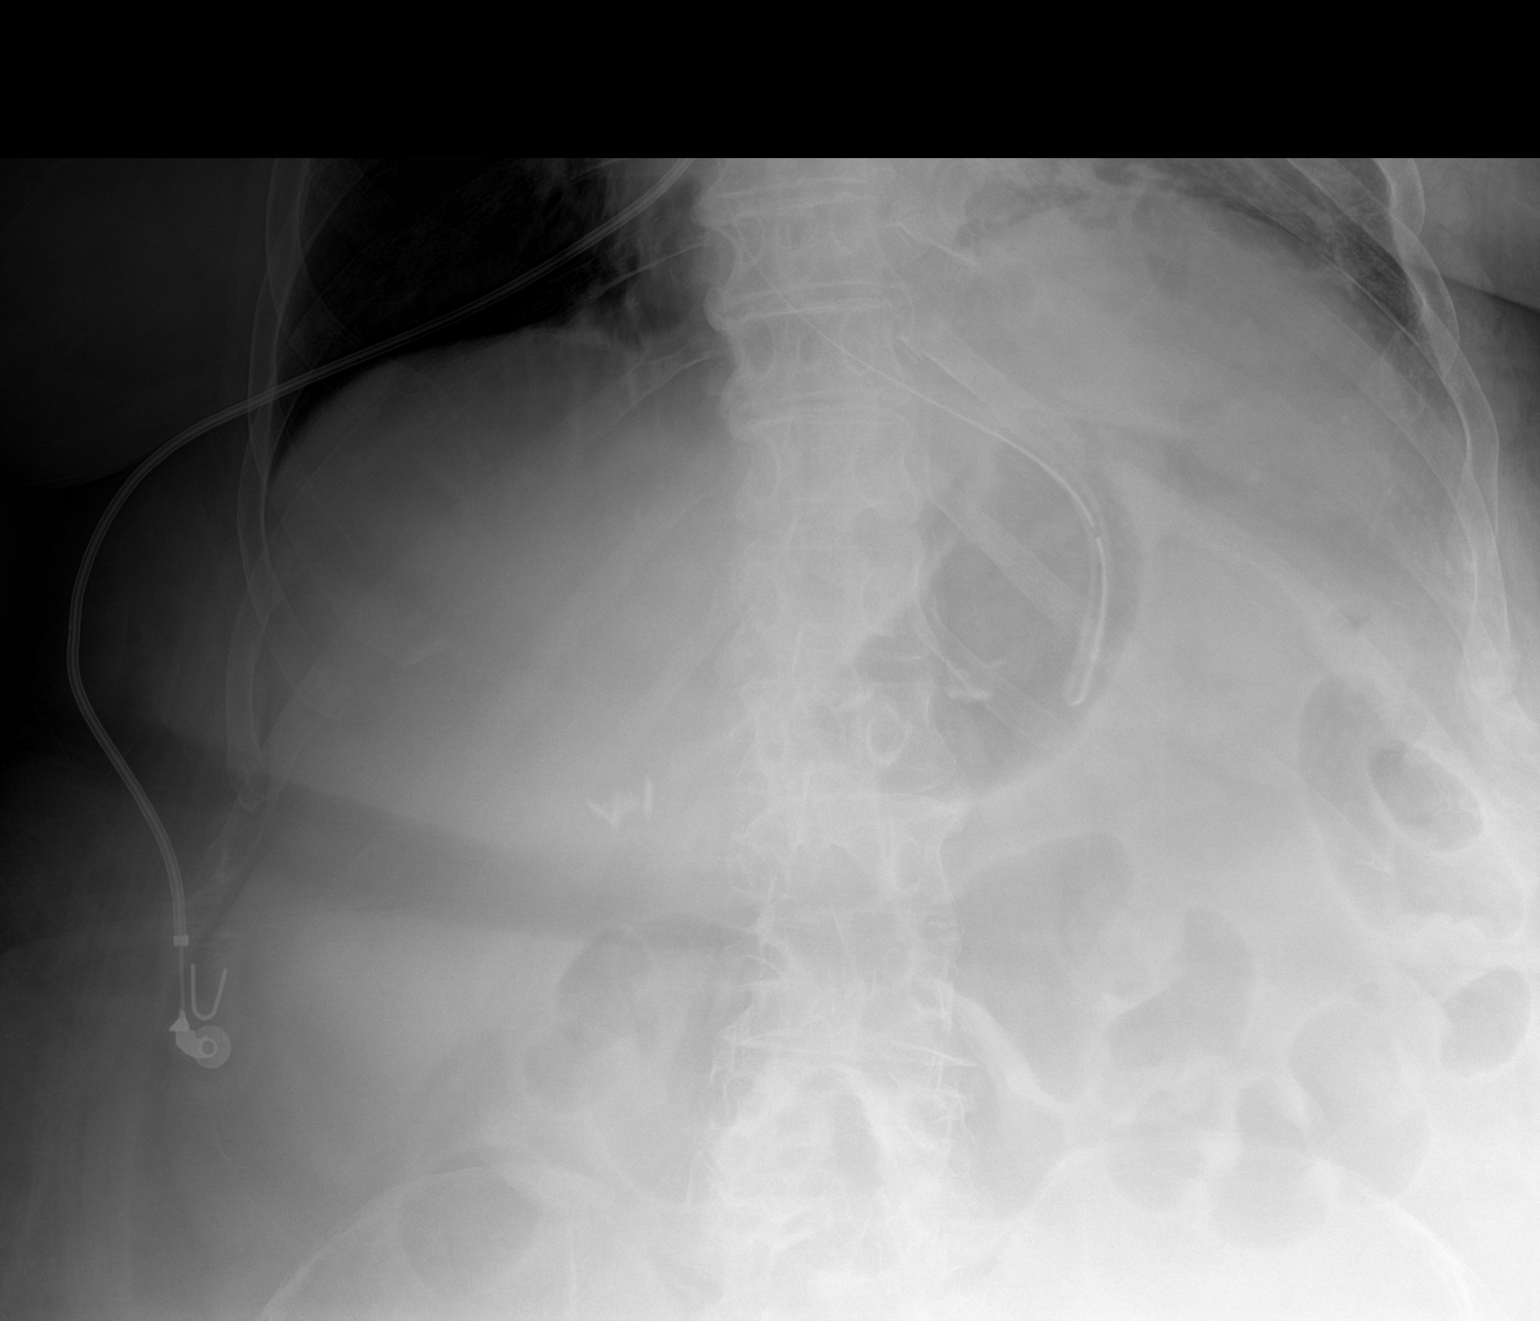

[2 of 2 positions shown; findings below may reference images not displayed]

FINDINGS: There are no dilated large or small bowel loops seen. Gas is seen
throughout the grossly nondistended colon. Enteric tube appears
adequately positioned in the stomach.

No evidence of free intraperitoneal air or abnormal fluid
collection. Patchy consolidations noted at the left lung base,
pneumonia versus atelectasis.
IMPRESSION: Nonobstructive bowel gas pattern. No dilated bowel loops or
air-fluid levels to suggest ileus. Enteric tube appears adequately
positioned in the stomach.

## 2020-01-14 ENCOUNTER — Telehealth: Payer: Self-pay | Admitting: *Deleted

## 2020-01-14 NOTE — Telephone Encounter (Signed)
-----   Message from Jonathan F Branch, MD sent at 01/13/2020 12:25 PM EDT ----- °Labs look fine, thyroid levels remain a little low, please forward results to her pcp to discuss with her ° °J Branch MD °

## 2020-01-15 NOTE — Telephone Encounter (Signed)
Daughter, [name] informed and verbalized understanding. Copy sent to PCP

## 2020-01-15 NOTE — Telephone Encounter (Signed)
-----   Message from Antoine Poche, MD sent at 01/13/2020 12:25 PM EDT ----- Labs look fine, thyroid levels remain a little low, please forward results to her pcp to discuss with her  Dominga Ferry MD

## 2020-01-17 IMAGING — DX DG ABD PORTABLE 1V
1 series · 1 of 1 positions shown · non-contrast
Comparison: Plain film from earlier same day.

CLINICAL DATA: Status post nasogastric tube advancement.

EXAM:
PORTABLE ABDOMEN - 1 VIEW

[abdomen]
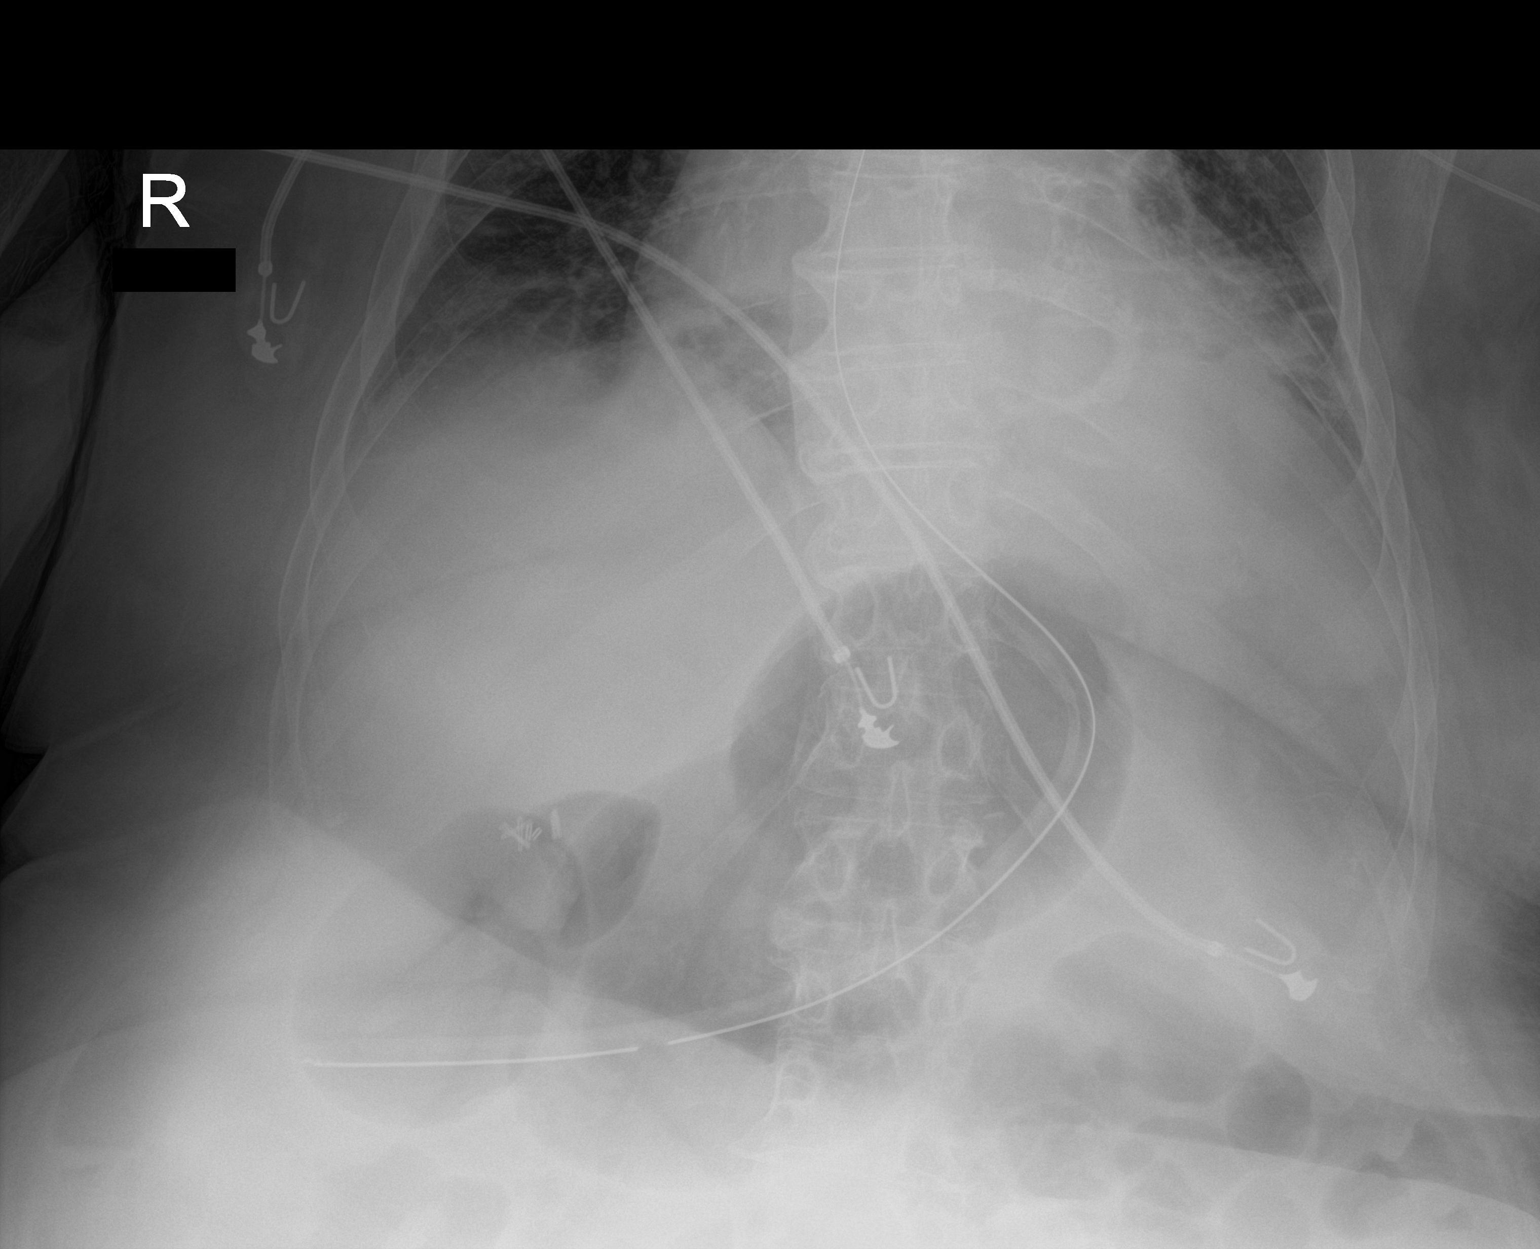

[1 of 1 positions shown; findings below may reference images not displayed]

FINDINGS: Nasogastric tube now appears well positioned with tip at the level
of the stomach pylorus or proximal duodenum. Stable opacity at the
left lung base, pneumonia versus atelectasis.
IMPRESSION: Nasogastric tube has been advanced, now well positioned with tip at
the level of the pylorus or proximal duodenum.

## 2020-01-17 IMAGING — DX DG ABD PORTABLE 1V
1 series · 1 of 1 positions shown · non-contrast
Comparison: Portable exam 2487 hours compared to 08/28/2017

CLINICAL DATA: Post nasogastric tube placement

EXAM:
PORTABLE ABDOMEN - 1 VIEW

[abdomen]
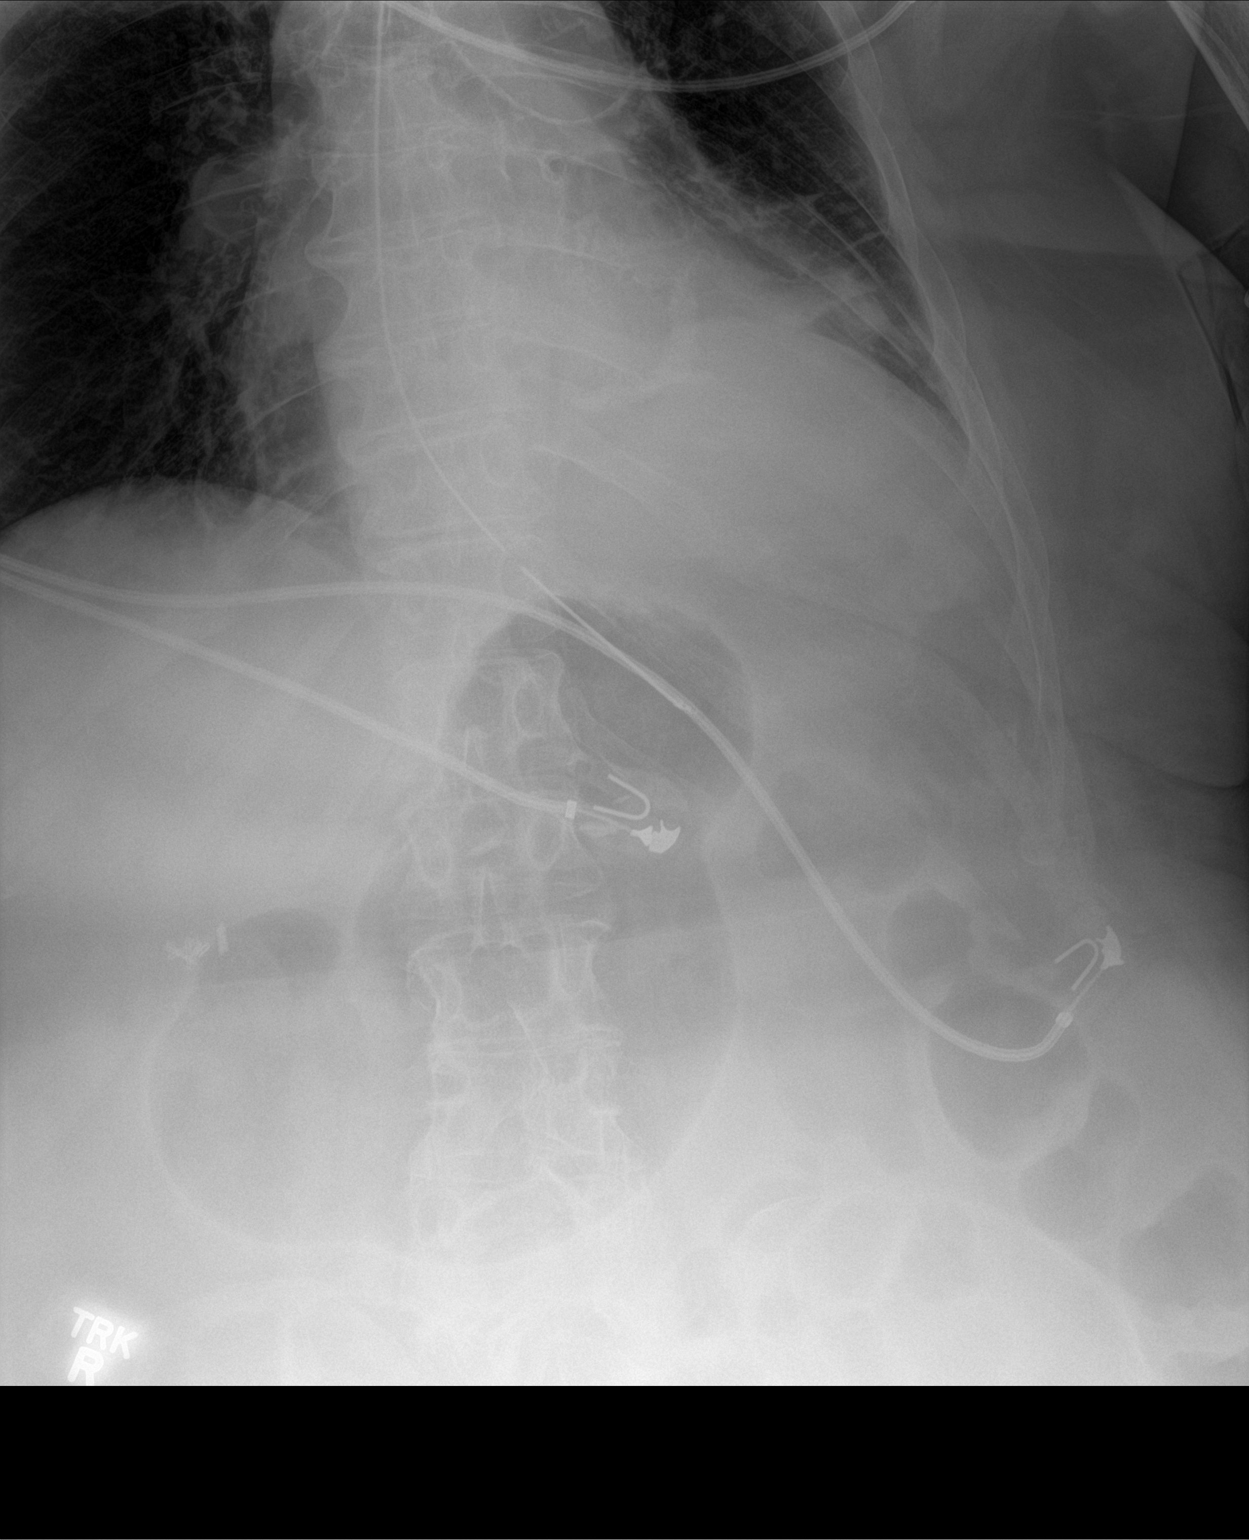

[1 of 1 positions shown; findings below may reference images not displayed]

FINDINGS: Tip of nasogastric tube projects over proximal stomach with proximal
side-port at approximately the gastroesophageal junction.

Gaseous distention of stomach.

Bones demineralized.

Mitral annular calcification noted.

Atelectasis versus consolidation LEFT lower lobe.
IMPRESSION: Tip of nasogastric tube projects over proximal stomach with proximal
side-port at approximately the gastroesophageal junction.

## 2020-02-05 DIAGNOSIS — Z9981 Dependence on supplemental oxygen: Secondary | ICD-10-CM | POA: Diagnosis not present

## 2020-02-05 DIAGNOSIS — E1142 Type 2 diabetes mellitus with diabetic polyneuropathy: Secondary | ICD-10-CM | POA: Diagnosis not present

## 2020-02-05 DIAGNOSIS — R600 Localized edema: Secondary | ICD-10-CM | POA: Diagnosis not present

## 2020-02-05 DIAGNOSIS — I5032 Chronic diastolic (congestive) heart failure: Secondary | ICD-10-CM | POA: Diagnosis not present

## 2020-02-12 IMAGING — DX DG HIP (WITH OR WITHOUT PELVIS) 5+V BILAT
5 series · 5 of 5 positions shown · non-contrast
Comparison: None.

CLINICAL DATA: 66-year-old female with fall and bilateral hip pain.

EXAM:
DG HIP (WITH OR WITHOUT PELVIS) 5+V BILAT

[pelvis ap]
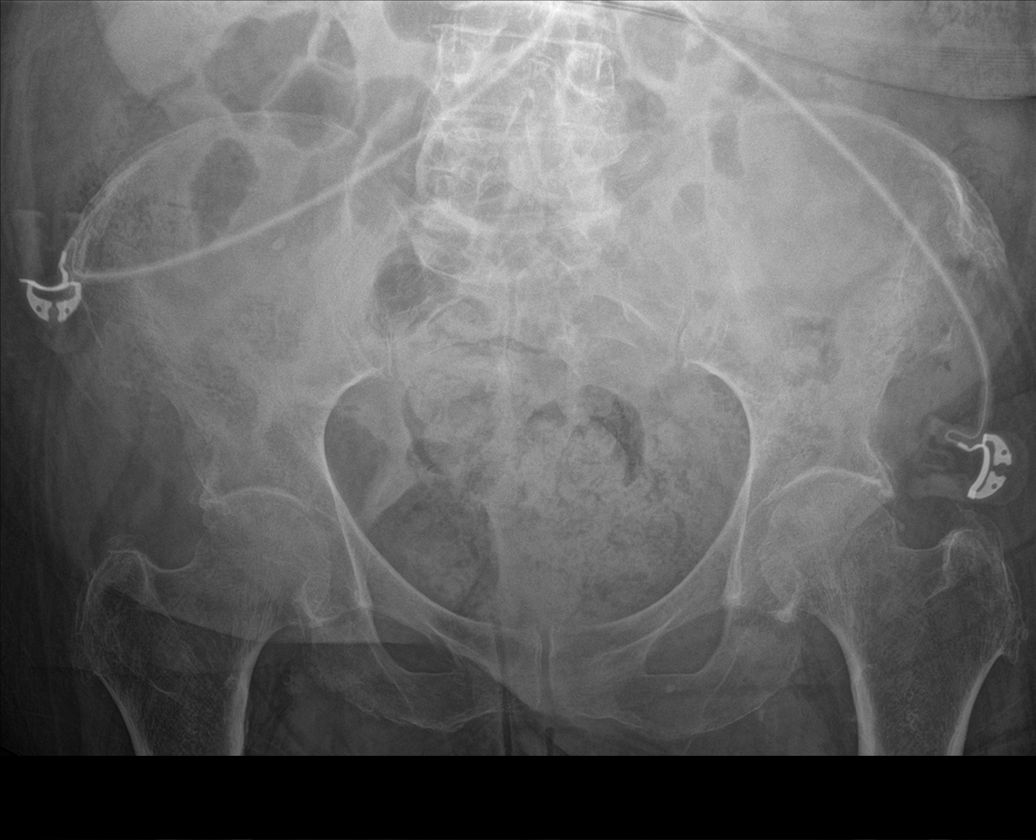

[hip ap (1 of 2)]
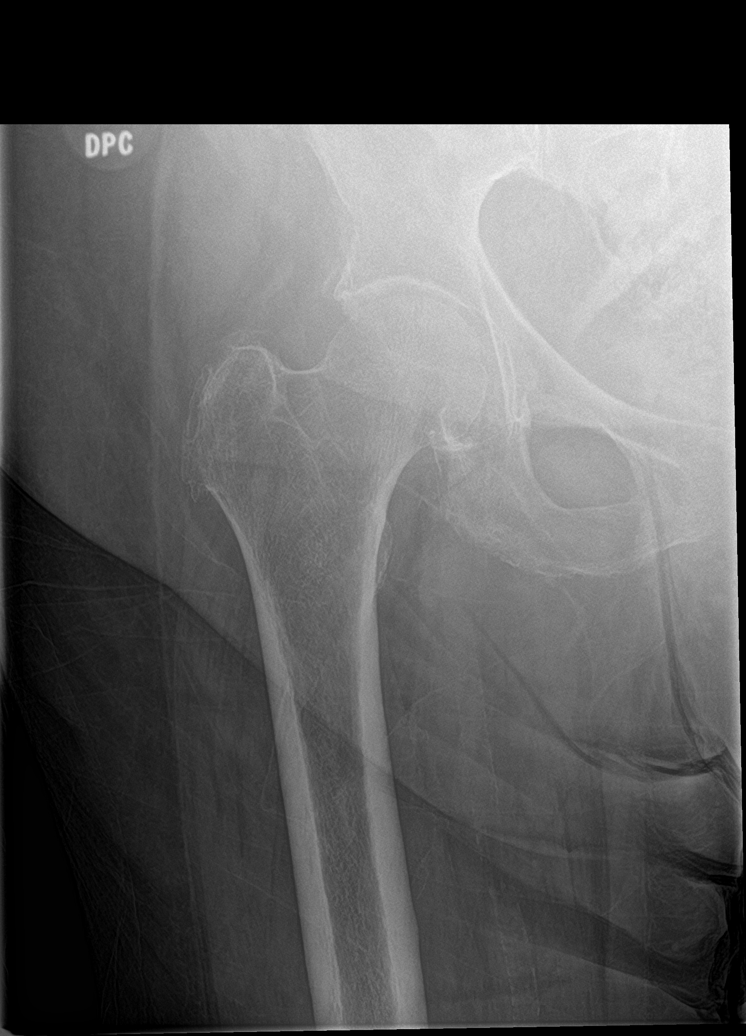

[hip lat (1 of 2)]
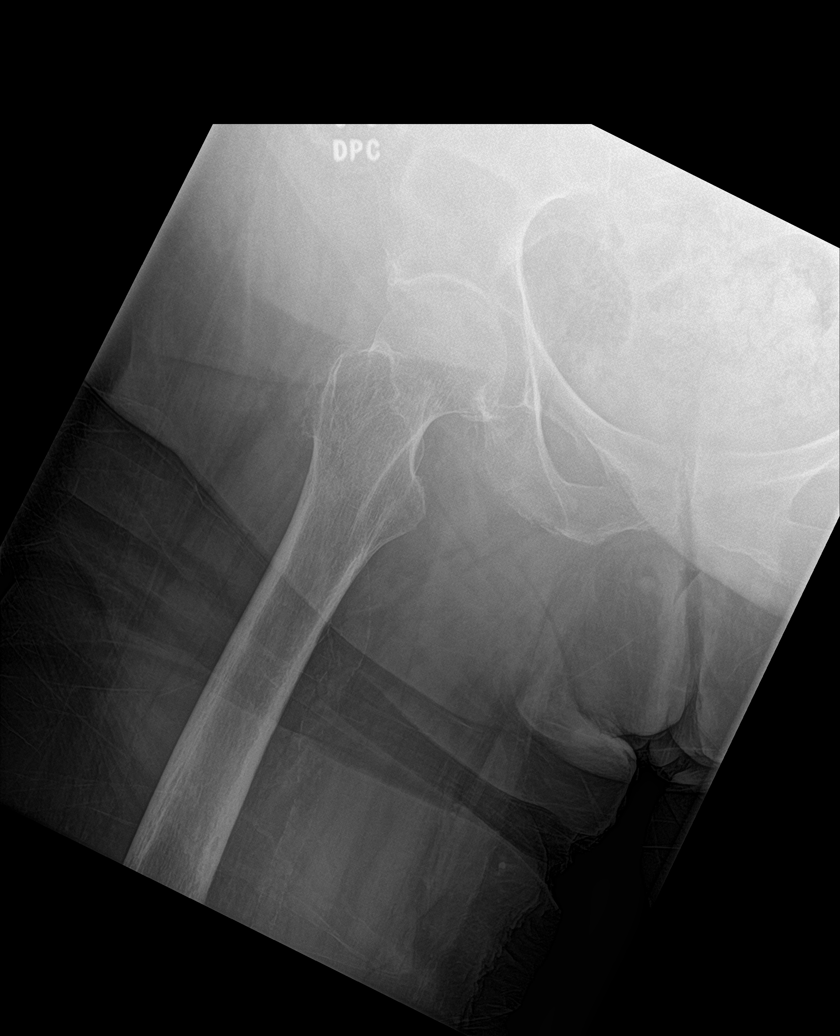

[hip ap (2 of 2)]
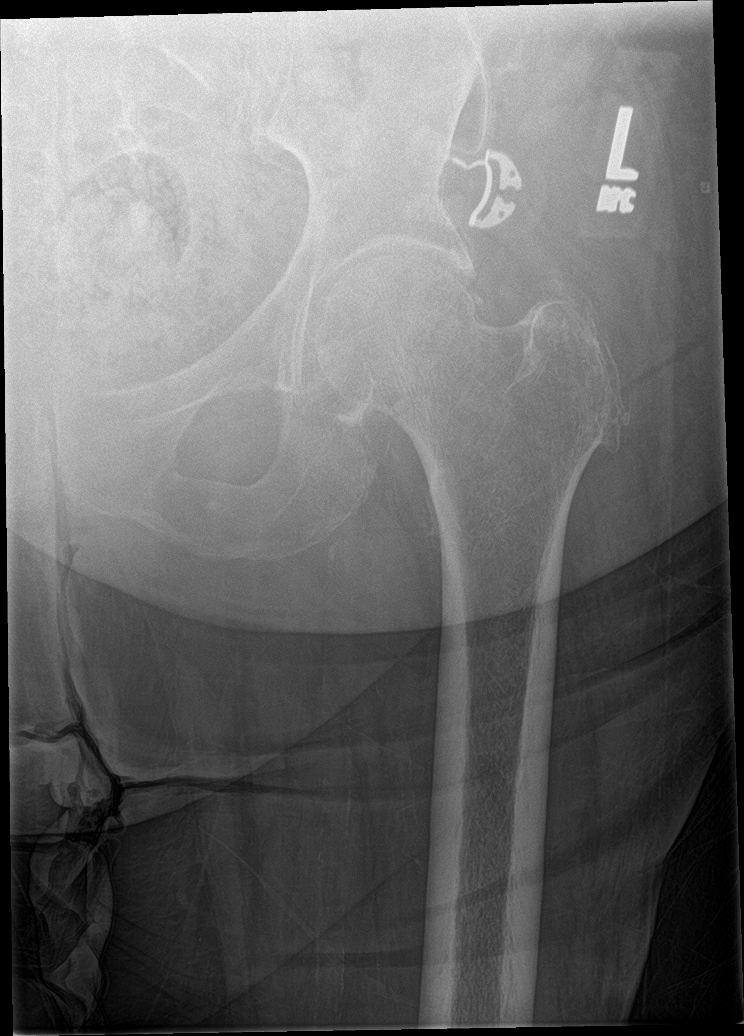

[hip lat (2 of 2)]
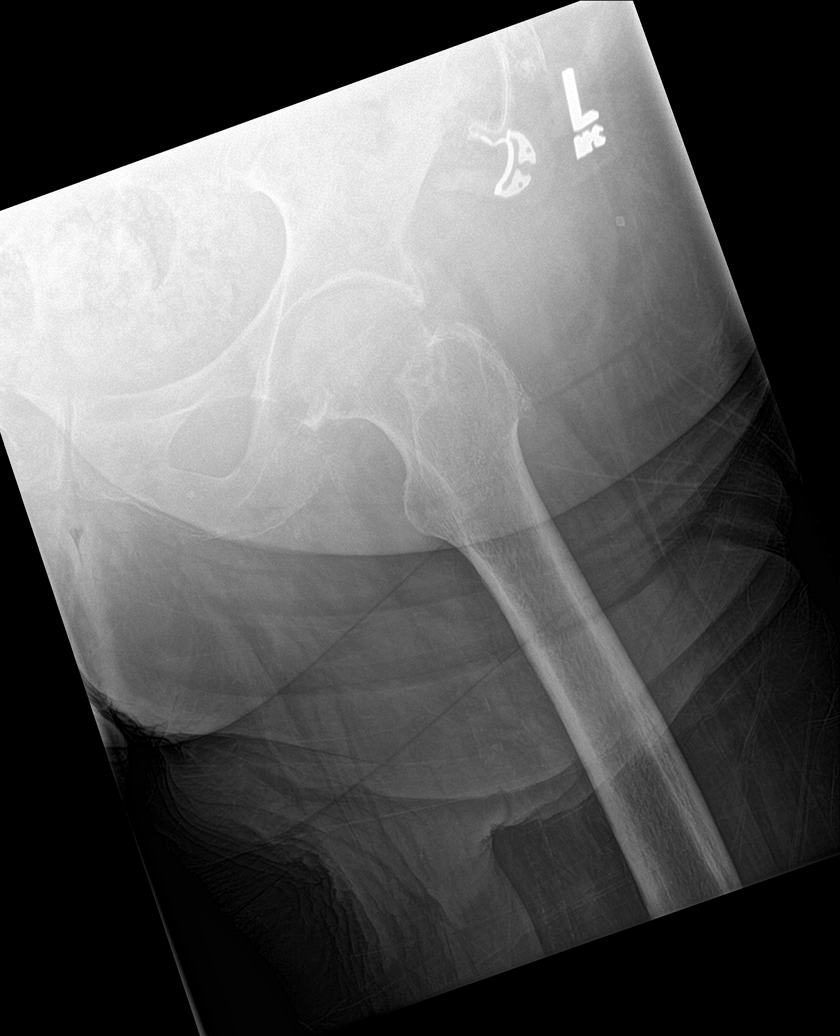

[5 of 5 positions shown; findings below may reference images not displayed]

FINDINGS: There is no acute fracture or dislocation. The bones are osteopenic.
Mild bilateral hip arthropathy. Degenerative changes of the lower
lumbar spine. Moderate stool throughout the visualized colon. The
soft tissues are grossly unremarkable.
IMPRESSION: No acute fracture or dislocation.

## 2020-02-14 ENCOUNTER — Other Ambulatory Visit: Payer: Self-pay

## 2020-02-14 ENCOUNTER — Encounter: Payer: Self-pay | Admitting: Cardiology

## 2020-02-14 ENCOUNTER — Telehealth (INDEPENDENT_AMBULATORY_CARE_PROVIDER_SITE_OTHER): Payer: PPO | Admitting: Cardiology

## 2020-02-14 VITALS — BP 101/73 | Ht 64.0 in | Wt 232.0 lb

## 2020-02-14 DIAGNOSIS — I5032 Chronic diastolic (congestive) heart failure: Secondary | ICD-10-CM

## 2020-02-14 MED ORDER — APIXABAN 5 MG PO TABS: 5.0000 mg | ORAL_TABLET | Freq: Two times a day (BID) | ORAL | 11 refills | Status: DC

## 2020-02-14 NOTE — Patient Instructions (Signed)
Medication Instructions:  Your physician recommends that you continue on your current medications as directed. Please refer to the Current Medication list given to you today.  *If you need a refill on your cardiac medications before your next appointment, please call your pharmacy*   Lab Work: BMET, MAGNESIUM IN 2 WEEKS ( 8/27)   If you have labs (blood work) drawn today and your tests are completely normal, you will receive your results only by: Marland Kitchen MyChart Message (if you have MyChart) OR . A paper copy in the mail If you have any lab test that is abnormal or we need to change your treatment, we will call you to review the results.   Testing/Procedures: NONE TODAY   Follow-Up: At [facility], you and your health needs are our priority.  As part of our continuing mission to provide you with exceptional heart care, we have created designated Provider Care Teams.  These Care Teams include your primary Cardiologist (physician) and Advanced Practice Providers (APPs -  Physician Assistants and Nurse Practitioners) who all work together to provide you with the care you need, when you need it.  We recommend signing up for the patient portal called "MyChart".  Sign up information is provided on this After Visit Summary.  MyChart is used to connect with patients for Virtual Visits (Telemedicine).  Patients are able to view lab/test results, encounter notes, upcoming appointments, etc.  Non-urgent messages can be sent to your provider as well.   To learn more about what you can do with MyChart, go to [email or web address].    Your next appointment:   3 month(s)  The format for your next appointment:   In Person  Provider:   Dina Rich, MD   Other Instructions NONE      Thank you for choosing [facility] !

## 2020-02-14 NOTE — Addendum Note (Signed)
Addended by: Marlyn Corporal A on: 02/14/2020 02:21 PM   Modules accepted: Orders

## 2020-02-14 NOTE — Progress Notes (Signed)
Virtual Visit via Telephone Note   This visit type was conducted due to national recommendations for restrictions regarding the COVID-19 Pandemic (e.g. social distancing) in an effort to limit this patient's exposure and mitigate transmission in our community.  Due to her co-morbid illnesses, this patient is at least at moderate risk for complications without adequate follow up.  This format is felt to be most appropriate for this patient at this time.  The patient did not have access to video technology/had technical difficulties with video requiring transitioning to audio format only (telephone).  All issues noted in this document were discussed and addressed.  No physical exam could be performed with this format.  Please refer to the patient's chart for her  consent to telehealth for [facility].    Date:  02/14/2020   ID:  [name], DOB 1951/05/12, MRN [id number] The patient was identified using 2 identifiers.  Patient Location: Home Provider Location: Office/Clinic  PCP:  Gareth Morgan, MD  Cardiologist:  Dina Rich, MD  Electrophysiologist:  None   Evaluation Performed:  Follow-Up Visit  Chief Complaint:  Follow up  History of Present Illness:    [name] is a 69 y.o. female seen today for follow up of the following medical problems.   1. Chronicsystolic diastolic CHF with mixed precap and post postcap Pulmonary Hypertension - echo 12/2013 PASP 85, moderate TR, RV mild to moderately dilated with normal function, could not eval diasotlic function due to afib, LVEF 50-55%. Biatrial enlargement.  - RHC 01/2015 PA 38/13 and calculated mean of 21, could not get a wedge however LVEDP 15. - repeat echo 10/2014 PASP 61 mmHg (down from 85 on prior echo), some evidence of RV dysfunction with RV TAPSE 1.4 and tissue anular systolic velocity of 7.5. Cannot evaluate diastolic function, however severe biatrial enlarement suggests significant dysfunction, - 01/2015 RHC mean  PA 52, PCWP 31 - 01/2015 echo LVEF 55-60%, PASP 46  - after most recent RHC in 01/2015 she was admitted for diuresis. Discharge weight 170 lbs, reportedly down from 190 on admission. Marland Kitchen Discharged on lasix 60mg  bid.     - last visit we increased torsemide to 40mg  bid - repeat labs showed stable renal function - weight is down 232 lbs. Still some swelling right leg which is chronic, no abdominal distension. No significant SOB/DOE      SH: completed her covid vaccine x2.  The patient does not have symptoms concerning for COVID-19 infection (fever, chills, cough, or new shortness of breath).    Past Medical History:  Diagnosis Date  . Abnormal pulmonary function test   . Anemia    H/H of 10/30 with a normal MCV in 12/09  . Anxiety   . Arthritis   . Barrett's esophagus    Diagnosed 1995. Last EGD 2016-NO BARRETT'S.   . Chest pain    Negative cardiac catheterization in 2002; negative stress nuclear study in 2008  . Chronic anticoagulation   . Chronic combined systolic and diastolic CHF (congestive heart failure) (HCC)    a. EF predominantly normal during prior echoes but was 40% during 10/2014 echo. b. Most recent 01/2015 EF normal, 55-60%.  . Chronic LBP    Surgical intervention in 1996  . Diabetes mellitus, type 2 (HCC)    Insulin therapy; exacerbated by prednisone  . Dysrhythmia    AFib  . Gastroparesis    99% retention 05/2008 on GES  . GERD (gastroesophageal reflux disease)   . Hiatal hernia   .  Hyperlipidemia   . Hypertension   . Hypothyroid   . IBS (irritable bowel syndrome)   . Obesity   . OSA on CPAP    had CPAP and cannot tolerate.  . Paroxysmal atrial fibrillation (HCC)   . Pulmonary hypertension (HCC) 01/2015   a. Predominantly pulmonary venous hypertension but may be component of PAH.  . Seizures (HCC)    last seizure was 2 years ago; on keppra for this; unknown etiology  . Syncope    a. Admitted 05/2009; magnetic resonance imagin/ MRA - negative;  etiology thought to be orthostasis secondary to drugs and dehydration. b. Syncope 02/2015 also felt 2/2 dehydration.   Past Surgical History:  Procedure Laterality Date  . BACK SURGERY  1996  . BIOPSY N/A 11/08/2013   Procedure: BIOPSY  / Tissue sampling / ulcers present in small intestine;  Surgeon: West Bali, MD;  Location: [facility];  Service: Endoscopy;  Laterality: N/A;  . CARDIAC CATHETERIZATION  2002  . CARDIAC CATHETERIZATION N/A 01/26/2015   Procedure: Right Heart Cath;  Surgeon: Laurey Morale, MD;  Location: [facility] INVASIVE CV LAB;  Service: Cardiovascular;  Laterality: N/A;  . CARDIOVASCULAR STRESS TEST  2008   Stress nuclear study  . CARDIOVERSION N/A 03/06/2015   Procedure: CARDIOVERSION;  Surgeon: Laurey Morale, MD;  Location: [facility] ENDOSCOPY;  Service: Cardiovascular;  Laterality: N/A;  . CARPAL TUNNEL RELEASE  1994  . COLONOSCOPY  11/2011   Dr. Darrick Penna: Internal hemorrhoids, mild diverticulosis. Random colon biopsies negative.  . COLONOSCOPY N/A 11/08/2013   SLF: Normal mucosa in the terminal ileum/The colon IS redundant/  Moderate diverticulosis throughout the entire colon. ileum bx benign. colon bx benign  . ESOPHAGOGASTRODUODENOSCOPY  2008   Barrett's without dysplasia. esphagus dilated. antral erosions, h.pylori serologies negative.  . ESOPHAGOGASTRODUODENOSCOPY  11/2011   Dr. Darrick Penna: Barrett's esophagus, mild gastritis, diverticulum in the second portion of the duodenum repeat EGD 3 years. Small bowel biopsies negative. Gastric biopsy show reactive gastropathy but no H. pylori. Esophageal biopsies consistent with GERD. Next EGD 11/2014  . ESOPHAGOGASTRODUODENOSCOPY N/A 11/21/2014   ZOX:WRUE non-erosive gastritis/irregular z-line  . GIVENS CAPSULE STUDY  12/07/2011   Proximal small bowel, rare AVM. Distal small bowel, multiple ulcers noted  . GIVENS CAPSULE STUDY N/A 09/27/2013   Distal small bowel ulcers extending to TI.  Marland Kitchen GIVENS CAPSULE STUDY N/A 10/10/2013   Procedure:  GIVENS CAPSULE STUDY;  Surgeon: West Bali, MD;  Location: [facility];  Service: Endoscopy;  Laterality: N/A;  7:30  . IR KYPHO THORACIC WITH BONE BIOPSY  02/09/2018  . KNEE ARTHROSCOPY WITH MEDIAL MENISECTOMY Right 06/09/2016   Procedure: KNEE ARTHROSCOPY WITH MEDIAL MENISECTOMY;  Surgeon: Vickki Hearing, MD;  Location: [facility] ORS;  Service: Orthopedics;  Laterality: Right;  medial and lateral menisectomy  . LAMINECTOMY  1995   L4-L5  . LAPAROSCOPIC CHOLECYSTECTOMY  1990s  . LEFT HEART CATHETERIZATION WITH CORONARY ANGIOGRAM  01/10/2014   Procedure: LEFT HEART CATHETERIZATION WITH CORONARY ANGIOGRAM;  Surgeon: Lesleigh Noe, MD;  Location: [facility] CATH LAB;  Service: Cardiovascular;;  . RIGHT HEART CATHETERIZATION N/A 01/10/2014   Procedure: RIGHT HEART CATH;  Surgeon: Lesleigh Noe, MD;  Location[facility] CATH LAB;  Service: Cardiovascular;  Laterality: N/A;  . TOTAL ABDOMINAL HYSTERECTOMY  1999  . TRACHEOSTOMY TUBE PLACEMENT N/A 09/01/2017   Procedure: TRACHEOSTOMY;  Surgeon: Drema Halon, MD;  Location: [facility] OR;  Service: ENT;  Laterality: N/A;     Current Meds  Medication Sig  . acetaminophen (TYLENOL) 500 MG tablet Take 500 mg by mouth as needed for moderate pain or headache.   Marland Kitchen amiodarone (PACERONE) 200 MG tablet Take 1 tablet (200 mg total) by mouth every morning.  Marland Kitchen apixaban (ELIQUIS) 5 MG TABS tablet Take 1 tablet (5 mg total) by mouth 2 (two) times daily.  . Cholecalciferol (VITAMIN D3) 2000 units capsule Take 2,000 Units by mouth daily.  Marland Kitchen dicyclomine (BENTYL) 20 MG tablet 1 PO QAC TID (Patient taking differently: Take 20 mg by mouth 3 (three) times daily before meals. 1 PO QAC TID)  . DULoxetine (CYMBALTA) 30 MG capsule Take 30 mg by mouth 2 (two) times daily.  Marland Kitchen gabapentin (NEURONTIN) 400 MG capsule Take by mouth 3 (three) times daily. Take 1 capsule(400mg ) 3 times daily.  Marland Kitchen glipiZIDE (GLUCOTROL) 10 MG tablet Take 10 mg by mouth 2 (two) times daily.  Marland Kitchen  HYDROcodone-acetaminophen (NORCO) 10-325 MG tablet Take 1 tablet by mouth at bedtime. Takes 1/2 tablet at bedtime.  . insulin glargine (LANTUS) 100 UNIT/ML injection Inject 0.55 mLs (55 Units total) into the skin at bedtime.  . Lactase 9000 units CHEW 1 PO Q4H WHEN YOU EAT ICE CREAM OR CHEESE (Patient taking differently: Chew 1 tablet by mouth every 4 (four) hours. 1 PO Q4H WHEN YOU EAT ICE CREAM OR CHEESE)  . levETIRAcetam (KEPPRA) 1000 MG tablet Take 1,500 mg by mouth 2 (two) times daily.   Marland Kitchen levothyroxine (SYNTHROID) 175 MCG tablet Take 1 tablet by mouth daily.  Marland Kitchen loperamide (IMODIUM) 2 MG capsule 1 PO Q4H PRN LOOSE STOOLS (Patient taking differently: Take 4 mg by mouth every 4 (four) hours as needed. 1 PO Q4H PRN LOOSE STOOLS)  . LORazepam (ATIVAN) 1 MG tablet Take 0.5 mg by mouth at bedtime as needed for anxiety or sleep. for anxiety  . magnesium oxide (MAG-OX) 400 MG tablet Take 1 tablet (400 mg total) by mouth 2 (two) times daily.  . meclizine (ANTIVERT) 25 MG tablet Take 25 mg by mouth as needed.   . metFORMIN (GLUCOPHAGE) 500 MG tablet Take 500 mg by mouth 2 (two) times daily with a meal.  . Multiple Vitamin (MULTIVITAMIN WITH MINERALS) TABS tablet Take 1 tablet by mouth daily.  . naproxen sodium (ALEVE) 220 MG tablet Take 440 mg by mouth as needed (pain).   . ondansetron (ZOFRAN) 4 MG tablet TAKE 1 TABLET BY MOUTH FOUR TIMES DAILY AS NEEDED FOR NAUSEA (Patient taking differently: Take 4 mg by mouth 4 (four) times daily as needed for nausea. )  . OXYGEN 2 L daily.   . pantoprazole (PROTONIX) 40 MG tablet TAKE ONE TABLET BY MOUTH TWICE DAILY BEFORE APPLY MEAL  . potassium chloride (KLOR-CON) 10 MEQ tablet Take 20 mEq by mouth daily. Take 2 tablets (20 meq) daily  . torsemide (DEMADEX) 20 MG tablet Take 2 tablets (40 mg total) by mouth 2 (two) times daily.  . [DISCONTINUED] apixaban (ELIQUIS) 5 MG TABS tablet Take 1 tablet (5 mg total) by mouth 2 (two) times daily.     Allergies:   Celexa  [citalopram hydrobromide], Codeine, Dilaudid [hydromorphone hcl], Reglan [metoclopramide], and Hyoscyamine   Social History   Tobacco Use  . Smoking status: Former Smoker    Packs/day: 0.25    Years: 15.00    Pack years: 3.75    Types: Cigarettes    Start date: 02/26/1966    Quit date: 07/01/1983    Years since quitting: 36.6  . Smokeless tobacco: Never  Used  . Tobacco comment: quit in 1984  Vaping Use  . Vaping Use: Never used  Substance Use Topics  . Alcohol use: No    Alcohol/week: 0.0 standard drinks  . Drug use: No     Family Hx: The patient's family history includes Alzheimer's disease in her mother; Breast cancer in her sister; Heart attack in her mother; Hypertension in her mother and another family member; Stroke in her mother. There is no history of Heart disease or Colon cancer.  ROS:   Please see the history of present illness.     All other systems reviewed and are negative.   Prior CV studies:   The following studies were reviewed today:    Labs/Other Tests and Data Reviewed:    EKG:  No ECG reviewed.  Recent Labs: 11/16/2019: B Natriuretic Peptide 263.0 01/08/2020: ALT 10; BUN 22; Creat 1.13; Hemoglobin 10.7; Magnesium 2.1; Platelets 194; Potassium 4.7; Sodium 141; TSH 5.79   Recent Lipid Panel Lab Results  Component Value Date/Time   CHOL 167 02/16/2018 10:43 AM   TRIG 179 (H) 02/16/2018 10:43 AM   HDL 38 (L) 02/16/2018 10:43 AM   CHOLHDL 4.4 02/16/2018 10:43 AM   LDLCALC 93 02/16/2018 10:43 AM    Wt Readings from Last 3 Encounters:  02/14/20 232 lb (105.2 kg)  12/24/19 241 lb (109.3 kg)  11/16/19 234 lb (106.1 kg)     Objective:    Vital Signs:  BP 101/73   Ht 5\' 4"  (1.626 m)   Wt 232 lb (105.2 kg)   BMI 39.82 kg/m    Normal affect. Normal speech pattern and tone. COmfortable, no apparent distress. No audible signs of sob or wheezing.   ASSESSMENT & PLAN:    1. Chronic diastolic heart failure -weights down 9 lbs since changing to  torsemide 40mg  bid. Some ongonig LE edema primarily right leg which is chronic. Labs were stable, conitnue current diuretic dose and repeat BMET/Mg in 2 weeks  COVID-19 Education: The signs and symptoms of COVID-19 were discussed with the patient and how to seek care for testing (follow up with PCP or arrange E-visit).  The importance of social distancing was discussed today.  Time:   Today, I have spent 14 minutes with the patient with telehealth technology discussing the above problems.     Medication Adjustments/Labs and Tests Ordered: Current medicines are reviewed at length with the patient today.  Concerns regarding medicines are outlined above.   Tests Ordered: No orders of the defined types were placed in this encounter.   Medication Changes: Meds ordered this encounter  Medications  . apixaban (ELIQUIS) 5 MG TABS tablet    Sig: Take 1 tablet (5 mg total) by mouth 2 (two) times daily.    Dispense:  60 tablet    Refill:  11    Follow Up:  In Person in 3 month(s)  Signed, Dina Rich, MD  02/14/2020 2:10 PM

## 2020-03-03 ENCOUNTER — Other Ambulatory Visit: Payer: Self-pay | Admitting: Student

## 2020-03-03 NOTE — Telephone Encounter (Signed)
This is a [place] pt.  °

## 2020-03-17 ENCOUNTER — Ambulatory Visit: Payer: PPO | Admitting: Gastroenterology

## 2020-03-17 ENCOUNTER — Other Ambulatory Visit: Payer: Self-pay

## 2020-03-17 ENCOUNTER — Encounter: Payer: Self-pay | Admitting: Gastroenterology

## 2020-03-17 VITALS — BP 137/86 | HR 82 | Temp 98.7°F | Ht 64.0 in | Wt 237.2 lb

## 2020-03-17 DIAGNOSIS — K580 Irritable bowel syndrome with diarrhea: Secondary | ICD-10-CM | POA: Diagnosis not present

## 2020-03-17 DIAGNOSIS — D509 Iron deficiency anemia, unspecified: Secondary | ICD-10-CM

## 2020-03-17 DIAGNOSIS — K5900 Constipation, unspecified: Secondary | ICD-10-CM

## 2020-03-17 DIAGNOSIS — K3184 Gastroparesis: Secondary | ICD-10-CM

## 2020-03-17 NOTE — Progress Notes (Signed)
Primary Care Physician: Gareth Morgan, MD  Primary Gastroenterologist:  Hennie Duos. Marletta Lor, DO   Chief Complaint  Patient presents with  . Diarrhea    off/on. Has had constipation for about 2 days now.   . gastroparesis    no nausea, no vomiting  . Abdominal Pain    across lower abd, comes/goes    HPI: [name] is a 69 y.o. female here for follow-up of chronic diarrhea, IBS-D, gastroparesis, history of IDA.  Last seen March 2021, virtual visit. EGD in May 2016 due to anemia and history of Barrett's without evidence of Barrett's. Last colonoscopy in May 2015. BPE hasnoted small to moderate sized sliding type hiatal hernia with spontaneous and inducible reflux, nonspecific esophageal dysmotility.Capsule in April 2015 with idiopathic small bowel ulcers that have been present since 2013.  Tendency towards diarrhea but admits to having no BM in the past two days. Diarrhea has been better when using lactaid and avoiding dairy as much as possible. She also takes bentyl three times daily. Generally has 3-4 stools some days. Usually does not have to take laxatives. No n/v. no melena, brbpr. Some lower abdominal pain off/on chronic in nature.   Overdue to see hematology for IDA. Cancelled appointments at beginning of covid pandemic and has been staying at home mostly. No iron infusions in a couple of years.        Current Outpatient Medications  Medication Sig Dispense Refill  . acetaminophen (TYLENOL) 500 MG tablet Take 500 mg by mouth as needed for moderate pain or headache.     Marland Kitchen amiodarone (PACERONE) 200 MG tablet TAKE 1 TABLET BY MOUTH EVERY MORNING 30 tablet 9  . apixaban (ELIQUIS) 5 MG TABS tablet Take 1 tablet (5 mg total) by mouth 2 (two) times daily. 60 tablet 11  . Cholecalciferol (VITAMIN D3) 2000 units capsule Take 2,000 Units by mouth daily.  11  . dicyclomine (BENTYL) 20 MG tablet 1 PO QAC TID (Patient taking differently: Take 20 mg by mouth 3 (three) times daily  before meals. 1 PO QAC TID) 90 tablet 11  . DULoxetine (CYMBALTA) 30 MG capsule Take 30 mg by mouth 2 (two) times daily.  0  . gabapentin (NEURONTIN) 400 MG capsule Take by mouth 3 (three) times daily. Take 1 capsule(400mg ) 3 times daily.  11  . glipiZIDE (GLUCOTROL) 10 MG tablet Take 10 mg by mouth 2 (two) times daily.  11  . HYDROcodone-acetaminophen (NORCO) 10-325 MG tablet Take 1 tablet by mouth at bedtime. Takes 1/2 tablet at bedtime.    . insulin glargine (LANTUS) 100 UNIT/ML injection Inject 0.55 mLs (55 Units total) into the skin at bedtime. 10 mL 0  . Lactase 9000 units CHEW 1 PO Q4H WHEN YOU EAT ICE CREAM OR CHEESE (Patient taking differently: Chew 1 tablet by mouth every 4 (four) hours. 1 PO Q4H WHEN YOU EAT ICE CREAM OR CHEESE) 90 tablet 11  . levETIRAcetam (KEPPRA) 1000 MG tablet Take 1,500 mg by mouth 2 (two) times daily.   1  . levothyroxine (SYNTHROID) 175 MCG tablet Take 1 tablet by mouth daily.    Marland Kitchen loperamide (IMODIUM) 2 MG capsule 1 PO Q4H PRN LOOSE STOOLS (Patient taking differently: Take 4 mg by mouth every 4 (four) hours as needed. 1 PO Q4H PRN LOOSE STOOLS) 120 capsule 11  . LORazepam (ATIVAN) 1 MG tablet Take 0.5 mg by mouth at bedtime as needed for anxiety or sleep. for anxiety  5  .  magnesium oxide (MAG-OX) 400 MG tablet Take 1 tablet (400 mg total) by mouth 2 (two) times daily. 180 tablet 1  . meclizine (ANTIVERT) 25 MG tablet Take 25 mg by mouth as needed.     . metFORMIN (GLUCOPHAGE) 500 MG tablet Take 500 mg by mouth 2 (two) times daily with a meal.    . Multiple Vitamin (MULTIVITAMIN WITH MINERALS) TABS tablet Take 1 tablet by mouth daily.    . naproxen sodium (ALEVE) 220 MG tablet Take 440 mg by mouth as needed (pain).     . ondansetron (ZOFRAN) 4 MG tablet TAKE 1 TABLET BY MOUTH FOUR TIMES DAILY AS NEEDED FOR NAUSEA (Patient taking differently: Take 4 mg by mouth 4 (four) times daily as needed for nausea. ) 120 tablet 1  . OXYGEN 2 L daily.     . pantoprazole  (PROTONIX) 40 MG tablet TAKE ONE TABLET BY MOUTH TWICE DAILY BEFORE APPLY MEAL 180 tablet 3  . potassium chloride (KLOR-CON) 10 MEQ tablet Take 20 mEq by mouth daily. Take 2 tablets (20 meq) daily    . torsemide (DEMADEX) 20 MG tablet Take 2 tablets (40 mg total) by mouth 2 (two) times daily. 120 tablet 6   No current facility-administered medications for this visit.    Allergies as of 03/17/2020 - Review Complete 03/17/2020  Allergen Reaction Noted  . Celexa [citalopram hydrobromide] Other (See Comments) 06/30/2013  . Codeine Nausea And Vomiting and Other (See Comments)   . Dilaudid [hydromorphone hcl] Other (See Comments) 12/31/2015  . Reglan [metoclopramide] Other (See Comments) 06/04/2014  . Hyoscyamine  09/05/2019    ROS:  General: Negative for anorexia, weight loss, fever, chills, fatigue, weakness. ENT: Negative for hoarseness, difficulty swallowing , nasal congestion. CV: Negative for chest pain, angina, palpitations, dyspnea on exertion, peripheral edema.  Respiratory: Negative for dyspnea at rest, dyspnea on exertion, cough, sputum, wheezing.  GI: See history of present illness. GU:  Negative for dysuria, hematuria, urinary incontinence, urinary frequency, nocturnal urination.  Endo: Negative for unusual weight change.    Physical Examination:   BP 137/86   Pulse 82   Temp 98.7 F (37.1 C) (Oral)   Ht 5\' 4"  (1.626 m)   Wt 237 lb 3.2 oz (107.6 kg)   BMI 40.72 kg/m   General: Chronically ill-appearing.  Accompanied by daughter Eyes: No icterus. Mouth: masked Lungs: Clear to auscultation bilaterally.  Heart: Regular rate and rhythm, no murmurs rubs or gallops.  Abdomen: Bowel sounds are normal, nontender, nondistended, no hepatosplenomegaly or masses, no abdominal bruits or hernia , no rebound or guarding.   Extremities: No lower extremity edema. No clubbing or deformities. Neuro: Alert and oriented x 4   Skin: Warm and dry, no jaundice.   Psych: Alert and  cooperative, normal mood and affect.  Labs:  Lab Results  Component Value Date   TSH 5.79 (H) 01/08/2020   Lab Results  Component Value Date   CREATININE 1.13 (H) 01/08/2020   BUN 22 01/08/2020   NA 141 01/08/2020   K 4.7 01/08/2020   CL 102 01/08/2020   CO2 31 01/08/2020   Lab Results  Component Value Date   ALT 10 01/08/2020   AST 14 01/08/2020   ALKPHOS 69 11/16/2019   BILITOT 0.3 01/08/2020   Lab Results  Component Value Date   WBC 5.8 01/08/2020   HGB 10.7 (L) 01/08/2020   HCT 33.8 (L) 01/08/2020   MCV 98.3 01/08/2020   PLT 194 01/08/2020   Lab Results  Component Value Date   IRON 24 (L) 08/18/2017   TIBC 221 (L) 08/18/2017   FERRITIN 131 05/08/2019     Imaging Studies: No results found.   Impression/plan:  Gastroparesis: Eating small meals throughout the day.  No significant nausea or vomiting.  Uses Zofran when needed.  Chronic diarrhea: Has been much improved with use of Lactaid and dairy avoidance when possible. Continues dicyclomine 20 mg 3 times daily.  Has been a couple of days without a bowel movement at this point.  Typically avoids laxatives.  Consider MiraLAX 1 packet 1-2 times daily for the next couple of days to assist with bowel movement.  Samples provided.  IDA: Due for updated labs.  Well overdue to see hematology, encouraged follow-up when they are ready.  Return for office in 6 months to meet Dr. Marletta Lor.  Call sooner if needed.

## 2020-03-17 NOTE — Patient Instructions (Signed)
1. You can add some miralax, one packet once or twice daily for next couple of days if needed to get bowels moving again. Samples provided.  2. Complete labs. We will contact you with results as available. 3. Return to see Dr. Marletta Lor (our new GI doctor who is taking care of Dr. Darrick Penna' patients now) in six months.

## 2020-03-18 ENCOUNTER — Encounter: Payer: Self-pay | Admitting: Gastroenterology

## 2020-03-18 NOTE — Progress Notes (Signed)
CC'ED TO PCP 

## 2020-04-08 DIAGNOSIS — K5900 Constipation, unspecified: Secondary | ICD-10-CM | POA: Diagnosis not present

## 2020-04-08 DIAGNOSIS — I5032 Chronic diastolic (congestive) heart failure: Secondary | ICD-10-CM | POA: Diagnosis not present

## 2020-04-08 DIAGNOSIS — K580 Irritable bowel syndrome with diarrhea: Secondary | ICD-10-CM | POA: Diagnosis not present

## 2020-04-08 DIAGNOSIS — D509 Iron deficiency anemia, unspecified: Secondary | ICD-10-CM | POA: Diagnosis not present

## 2020-04-08 DIAGNOSIS — K3184 Gastroparesis: Secondary | ICD-10-CM | POA: Diagnosis not present

## 2020-04-09 LAB — IRON,TIBC AND FERRITIN PANEL
%SAT: 16 % (calc) (ref 16–45)
Ferritin: 58 ng/mL (ref 16–288)
Iron: 42 ug/dL — ABNORMAL LOW (ref 45–160)
TIBC: 260 mcg/dL (calc) (ref 250–450)

## 2020-04-09 LAB — MAGNESIUM: Magnesium: 2.2 mg/dL (ref 1.5–2.5)

## 2020-04-09 LAB — BASIC METABOLIC PANEL
BUN/Creatinine Ratio: 21 (calc) (ref 6–22)
BUN: 29 mg/dL — ABNORMAL HIGH (ref 7–25)
CO2: 38 mmol/L — ABNORMAL HIGH (ref 20–32)
Calcium: 9.4 mg/dL (ref 8.6–10.4)
Chloride: 97 mmol/L — ABNORMAL LOW (ref 98–110)
Creat: 1.4 mg/dL — ABNORMAL HIGH (ref 0.60–0.93)
Glucose, Bld: 129 mg/dL — ABNORMAL HIGH (ref 65–99)
Potassium: 4.3 mmol/L (ref 3.5–5.3)
Sodium: 140 mmol/L (ref 135–146)

## 2020-04-09 LAB — CBC WITH DIFFERENTIAL/PLATELET
Absolute Monocytes: 640 cells/uL (ref 200–950)
Basophils Absolute: 57 cells/uL (ref 0–200)
Basophils Relative: 0.7 %
Eosinophils Absolute: 164 cells/uL (ref 15–500)
Eosinophils Relative: 2 %
HCT: 37.3 % (ref 35.0–45.0)
Hemoglobin: 12.3 g/dL (ref 11.7–15.5)
Lymphs Abs: 2526 cells/uL (ref 850–3900)
MCH: 31.6 pg (ref 27.0–33.0)
MCHC: 33 g/dL (ref 32.0–36.0)
MCV: 95.9 fL (ref 80.0–100.0)
MPV: 12.2 fL (ref 7.5–12.5)
Monocytes Relative: 7.8 %
Neutro Abs: 4813 cells/uL (ref 1500–7800)
Neutrophils Relative %: 58.7 %
Platelets: 193 10*3/uL (ref 140–400)
RBC: 3.89 10*6/uL (ref 3.80–5.10)
RDW: 14 % (ref 11.0–15.0)
Total Lymphocyte: 30.8 %
WBC: 8.2 10*3/uL (ref 3.8–10.8)

## 2020-04-11 IMAGING — DX DG CHEST 2V
2 series · 2 of 2 positions shown · non-contrast
Comparison: 08/27/2017

ADDENDUM:
These results were called by telephone at the time of interpretation
on 11/25/2017 at [DATE] to Dr. DENNYS KARLIN , who verbally
acknowledged these results.
CLINICAL DATA: Increasing shortness of breath.

EXAM:
CHEST - 2 VIEW

[chest pa]
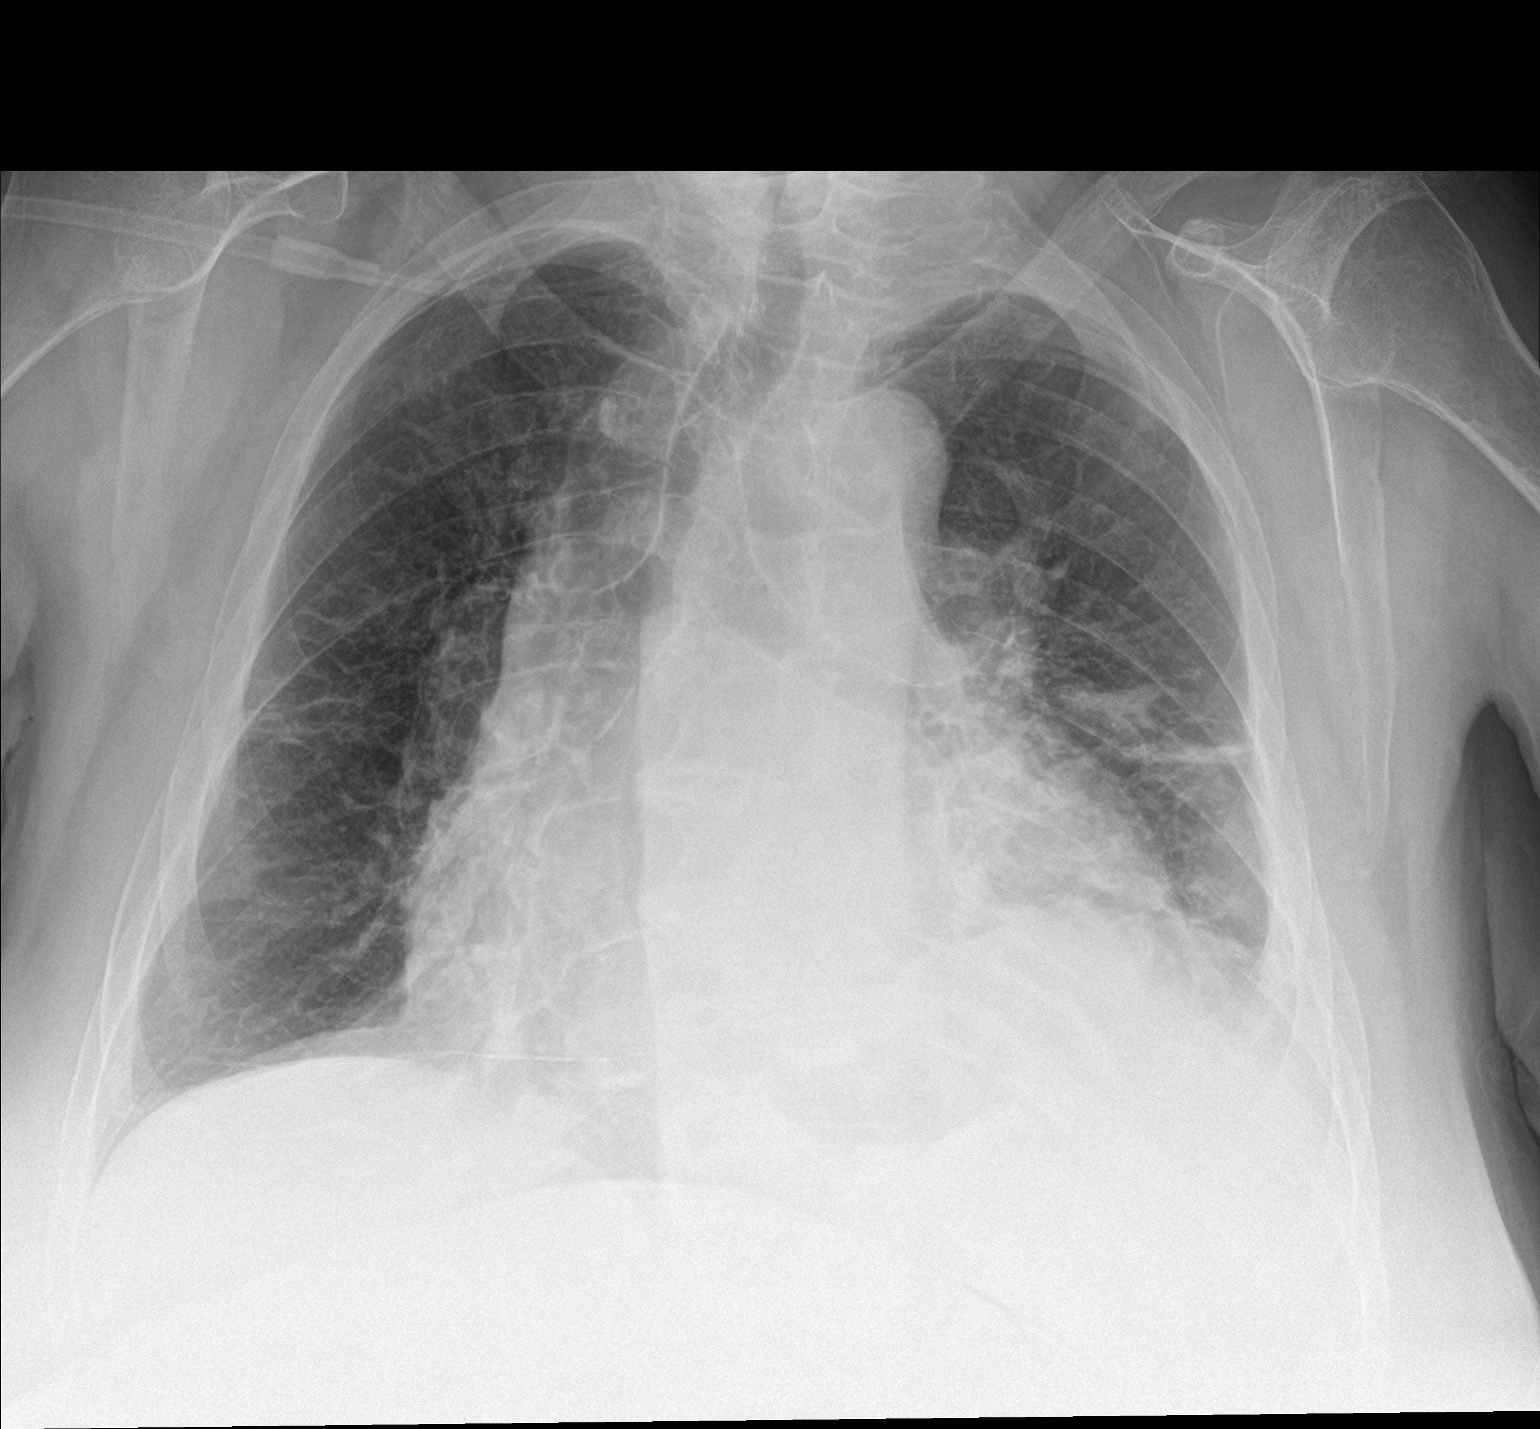

[chest lat]
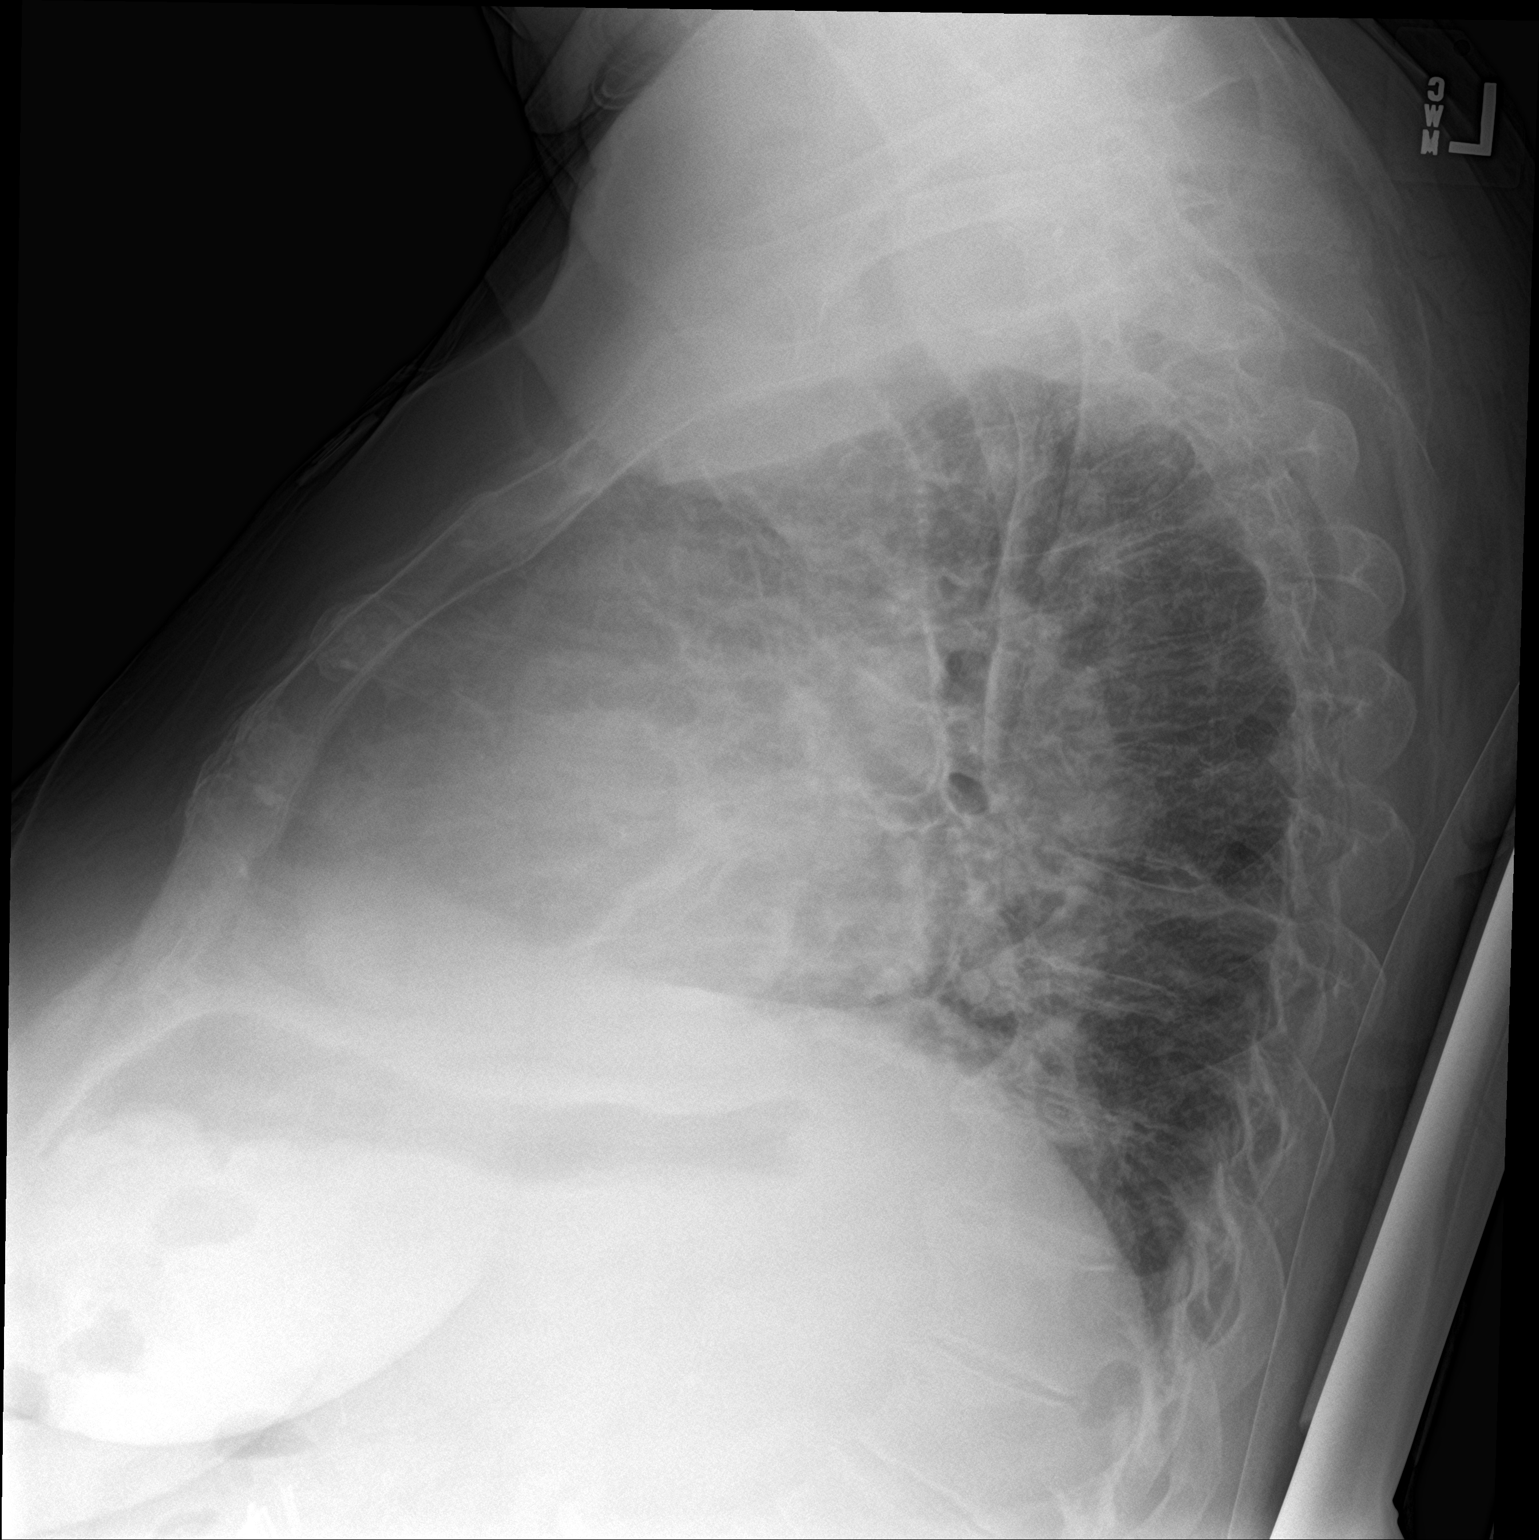

[2 of 2 positions shown; findings below may reference images not displayed]

FINDINGS: The cardiac silhouette is enlarged. There is an apparent widening of
the mediastinum. Mild calcific atherosclerotic disease of the aorta.

Elevation of the left hemidiaphragm with patchy airspace
consolidation versus atelectasis in left lower lobe.

Osseous structures are without acute abnormality. Soft tissues are
grossly normal.
IMPRESSION: Apparent widening of the mediastinal silhouette. This may be due to
shallow inspiration, however it may also be seen with aneurysmal
dilation of the ascending aorta. If acute aortic syndrome as
clinically suspected, CT angiogram of the chest should be
considered.

Enlarged cardiac silhouette.

Elevation of the left hemidiaphragm with atelectasis versus airspace
disease in the left lower lobe.

## 2020-04-12 IMAGING — CT CT CHEST W/O CM
2 of 3 series · 15 of 36 positions shown, 18 images · non-contrast
Comparison: CT scan of October 11, 2016.

CLINICAL DATA: Shortness of breath.

EXAM:
CT CHEST WITHOUT CONTRAST
TECHNIQUE: Multidetector CT imaging of the chest was performed following the
standard protocol without IV contrast.

[Series 3: chest w/o 2mm st · axial · non-contrast · 0.71mm/px · z∈[+1153,+1395]mm · 12 of 143 slices shown, 15 images]
[im 11/143  mediastinal]
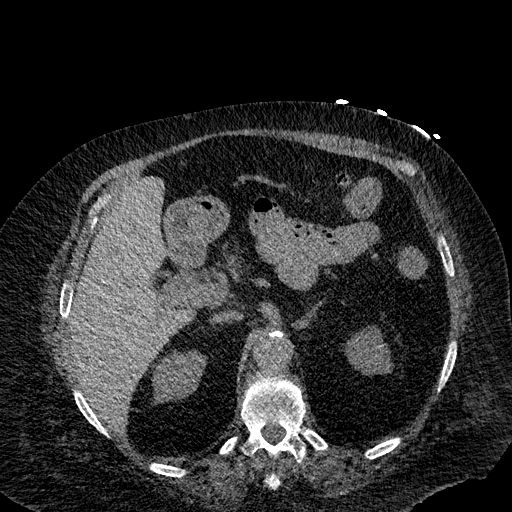
[im 11/143  lung]
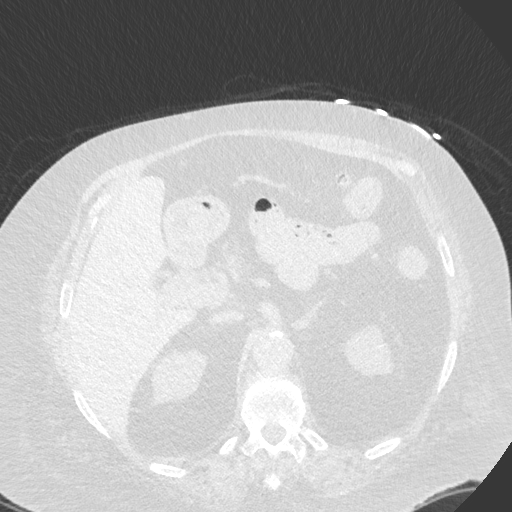
[im 22/143  lung]
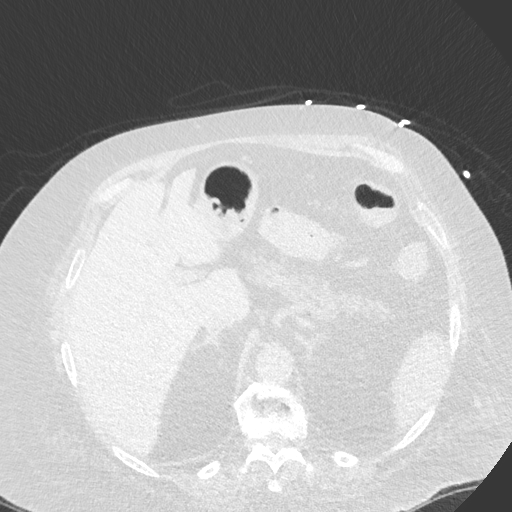
[im 32/143  lung]
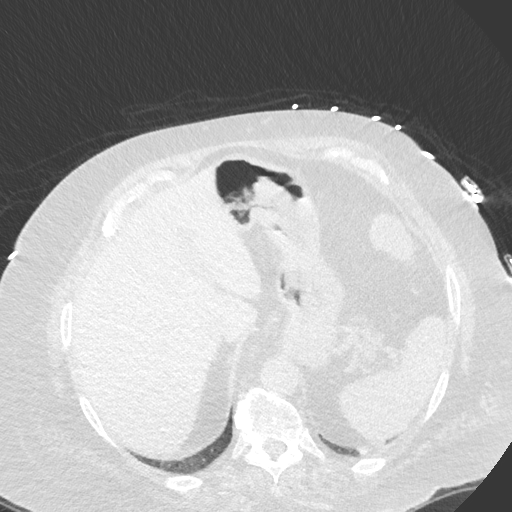
[im 43/143  lung]
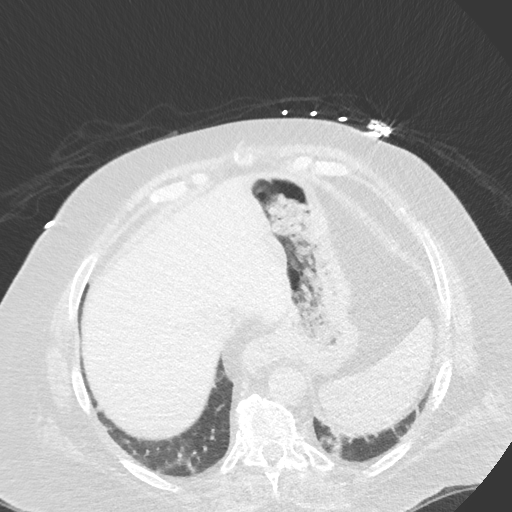
[im 53/143  mediastinal]
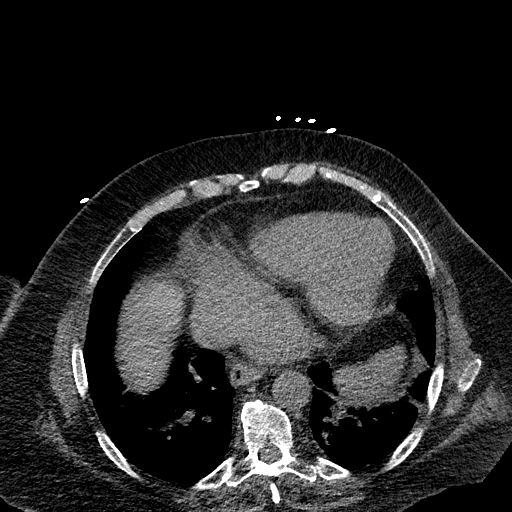
[im 53/143  lung]
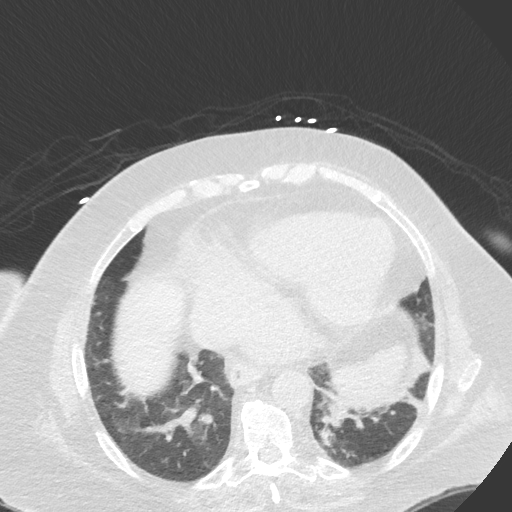
[im 64/143  lung]
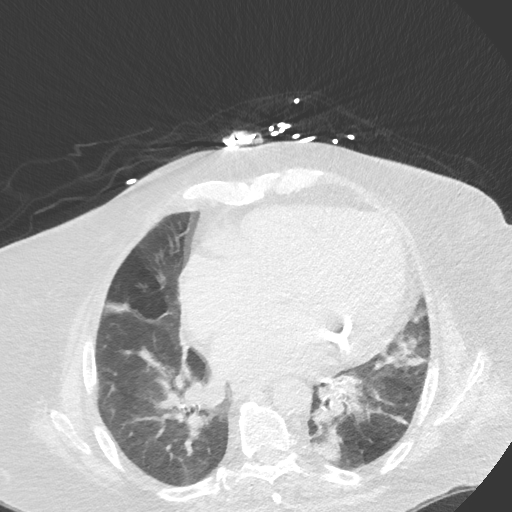
[im 79/143  lung]
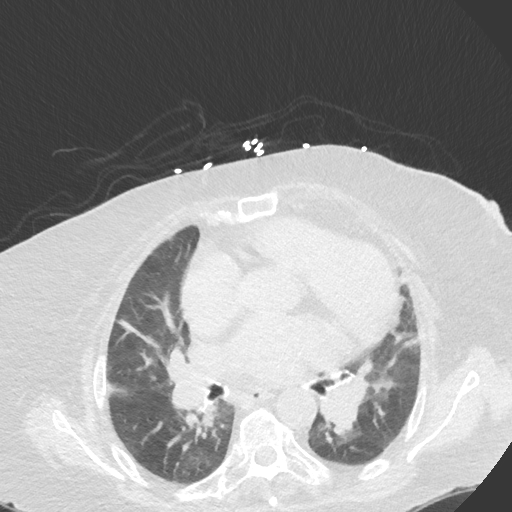
[im 90/143  lung]
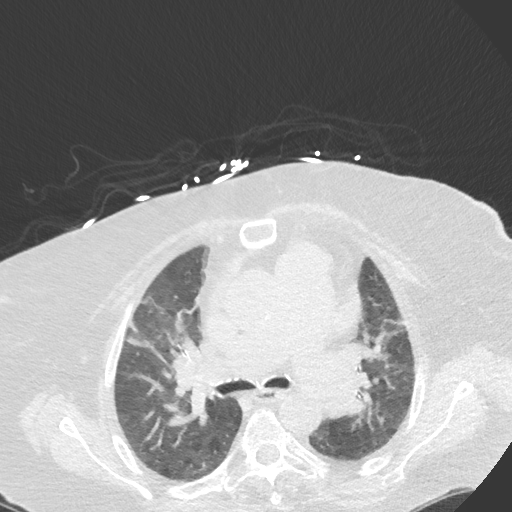
[im 100/143  mediastinal]
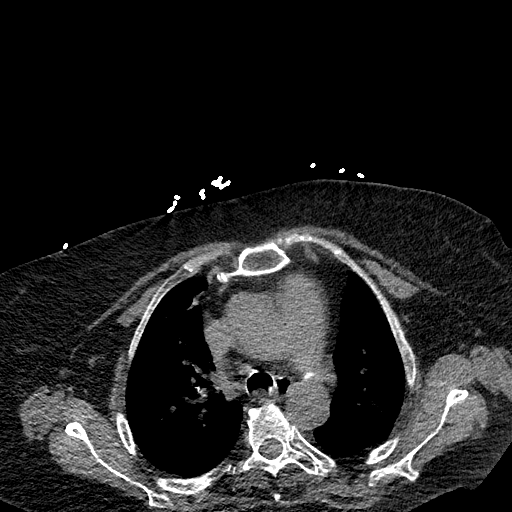
[im 100/143  lung]
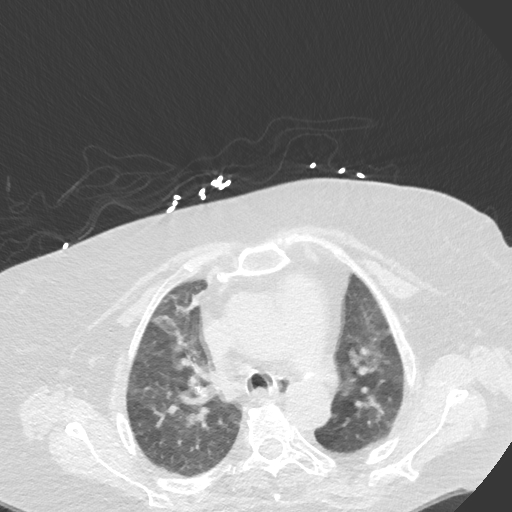
[im 111/143  lung]
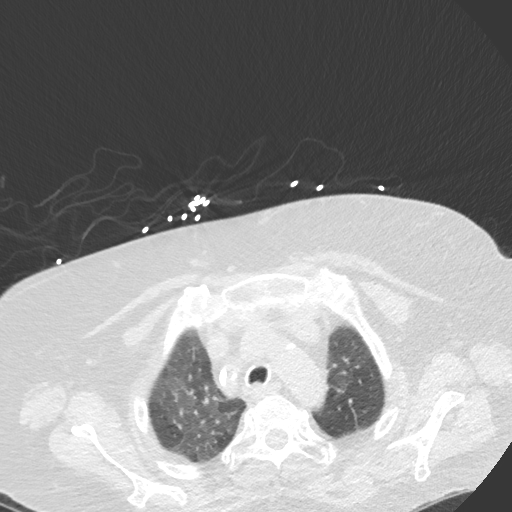
[im 121/143  lung]
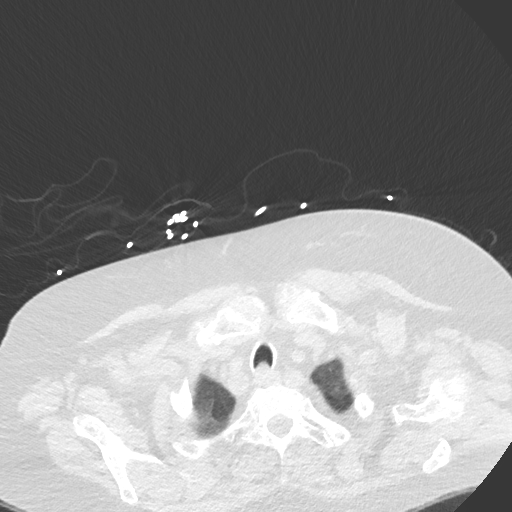
[im 132/143  lung]
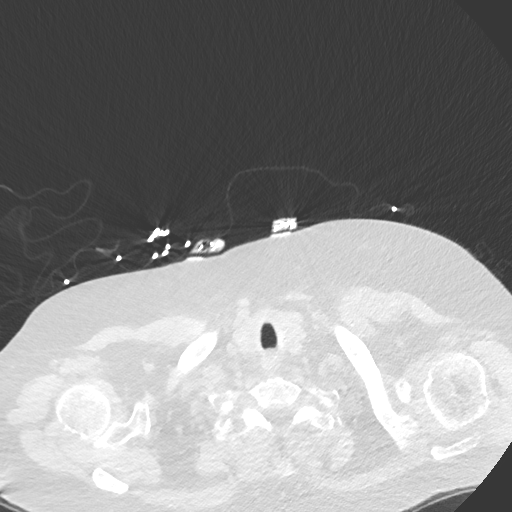

[Series 5: chest w/o 3mm st cor · coronal · non-contrast · 0.59mm/px · 3 of 101 slices shown]
[im 21/101  lung]
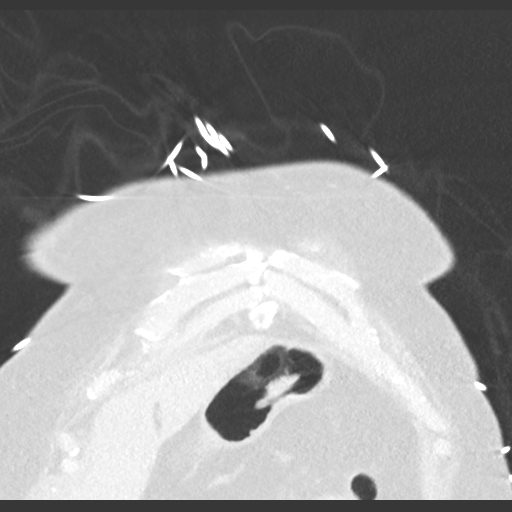
[im 41/101  lung]
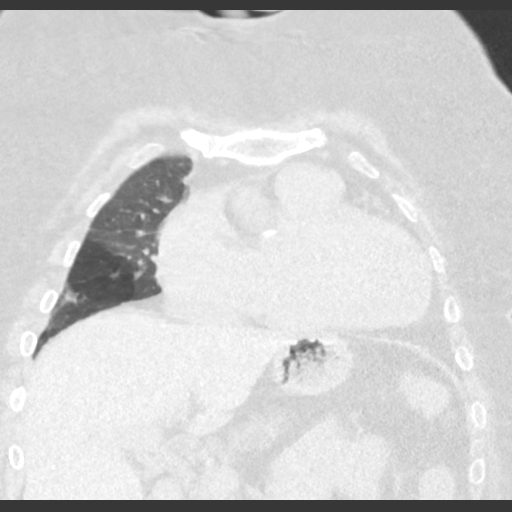
[im 61/101  lung]
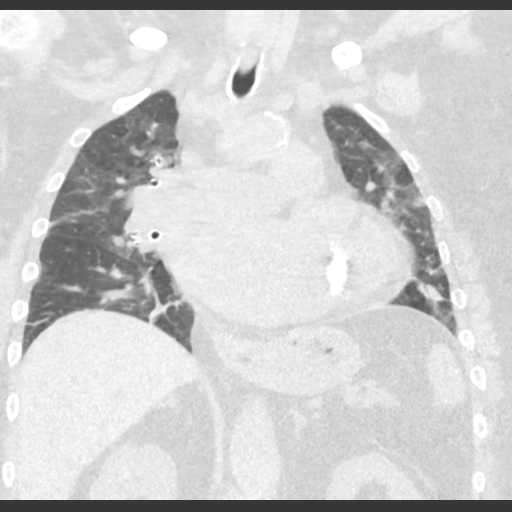

[15 of 36 positions shown; findings below may reference images not displayed]

FINDINGS: Cardiovascular: Atherosclerosis of thoracic aorta is noted without
aneurysm formation. Cardiomegaly is noted. No pericardial effusion
is noted.

Mediastinum/Nodes: No enlarged mediastinal or axillary lymph nodes.
Thyroid gland, trachea, and esophagus demonstrate no significant
findings.

Lungs/Pleura: No pneumothorax is noted. No significant pleural
effusion is noted. Stable scarring is noted in left lung base and
right upper lobe. No definite acute abnormality is noted.

Upper Abdomen: No acute abnormality.

Musculoskeletal: No chest wall mass or suspicious bone lesions
identified.
IMPRESSION: Stable bilateral lung scarring is noted.

No definite acute abnormality seen.

Aortic Atherosclerosis ([id number]).

## 2020-04-13 ENCOUNTER — Other Ambulatory Visit: Payer: Self-pay | Admitting: *Deleted

## 2020-04-13 DIAGNOSIS — D649 Anemia, unspecified: Secondary | ICD-10-CM

## 2020-04-13 DIAGNOSIS — D509 Iron deficiency anemia, unspecified: Secondary | ICD-10-CM

## 2020-04-13 DIAGNOSIS — D500 Iron deficiency anemia secondary to blood loss (chronic): Secondary | ICD-10-CM

## 2020-04-14 DIAGNOSIS — M130 Polyarthritis, unspecified: Secondary | ICD-10-CM | POA: Diagnosis not present

## 2020-04-14 DIAGNOSIS — R569 Unspecified convulsions: Secondary | ICD-10-CM | POA: Diagnosis not present

## 2020-04-14 DIAGNOSIS — R413 Other amnesia: Secondary | ICD-10-CM | POA: Diagnosis not present

## 2020-04-14 DIAGNOSIS — M25569 Pain in unspecified knee: Secondary | ICD-10-CM | POA: Diagnosis not present

## 2020-04-14 DIAGNOSIS — M797 Fibromyalgia: Secondary | ICD-10-CM | POA: Diagnosis not present

## 2020-04-15 ENCOUNTER — Telehealth: Payer: Self-pay | Admitting: *Deleted

## 2020-04-15 NOTE — Telephone Encounter (Signed)
-----   Message from Jonathan F Branch, MD sent at 04/15/2020  3:35 PM EDT ----- °Labs show some increased stress on the kidneys, would verify she is taking torsemide 40mg bid, if so lower to 40mg in AM and 20mg in pm. If significant swelling can take 40mg bid for 2-3 days  ° °J Branch MD °

## 2020-04-16 ENCOUNTER — Telehealth: Payer: Self-pay

## 2020-04-16 MED ORDER — TORSEMIDE 20 MG PO TABS
ORAL_TABLET | ORAL | 3 refills | Status: DC
Start: 2020-04-16 — End: 2020-07-29

## 2020-04-16 NOTE — Telephone Encounter (Signed)
-----   Message from Antoine Poche, MD sent at 04/15/2020  3:35 PM EDT ----- Labs show some increased stress on the kidneys, would verify she is taking torsemide 40mg  bid, if so lower to 40mg  in AM and 20mg  in pm. If significant swelling can take 40mg  bid for 2-3 days   MD

## 2020-04-16 NOTE — Telephone Encounter (Signed)
I spoke with daughter and she agrees to reduce patients torsemide to 40 mg am and 20 mg pm.She also understands that in case of significant swelling, Ms.Perman may take 40 mg BID for 2-3 days.

## 2020-04-20 NOTE — Telephone Encounter (Signed)
See 10/14 phone note - pt aware

## 2020-04-28 ENCOUNTER — Ambulatory Visit: Payer: PPO | Admitting: Cardiology

## 2020-05-04 DIAGNOSIS — E113293 Type 2 diabetes mellitus with mild nonproliferative diabetic retinopathy without macular edema, bilateral: Secondary | ICD-10-CM | POA: Diagnosis not present

## 2020-05-05 DIAGNOSIS — R269 Unspecified abnormalities of gait and mobility: Secondary | ICD-10-CM | POA: Diagnosis not present

## 2020-05-05 DIAGNOSIS — I5032 Chronic diastolic (congestive) heart failure: Secondary | ICD-10-CM | POA: Diagnosis not present

## 2020-05-06 DIAGNOSIS — I5032 Chronic diastolic (congestive) heart failure: Secondary | ICD-10-CM | POA: Diagnosis not present

## 2020-05-06 DIAGNOSIS — E162 Hypoglycemia, unspecified: Secondary | ICD-10-CM | POA: Diagnosis not present

## 2020-05-06 DIAGNOSIS — E1142 Type 2 diabetes mellitus with diabetic polyneuropathy: Secondary | ICD-10-CM | POA: Diagnosis not present

## 2020-05-06 DIAGNOSIS — E039 Hypothyroidism, unspecified: Secondary | ICD-10-CM | POA: Diagnosis not present

## 2020-05-08 IMAGING — DX DG LUMBAR SPINE COMPLETE 4+V
5 series · 5 of 5 positions shown · non-contrast
Comparison: CT of the abdomen and pelvis on 08/25/2014

CLINICAL DATA: Pt c/o lower back pain and bilateral leg pain for 1x
week with tingling. No known injury.

EXAM:
LUMBAR SPINE - COMPLETE 4+ VIEW

[l-spine ap]
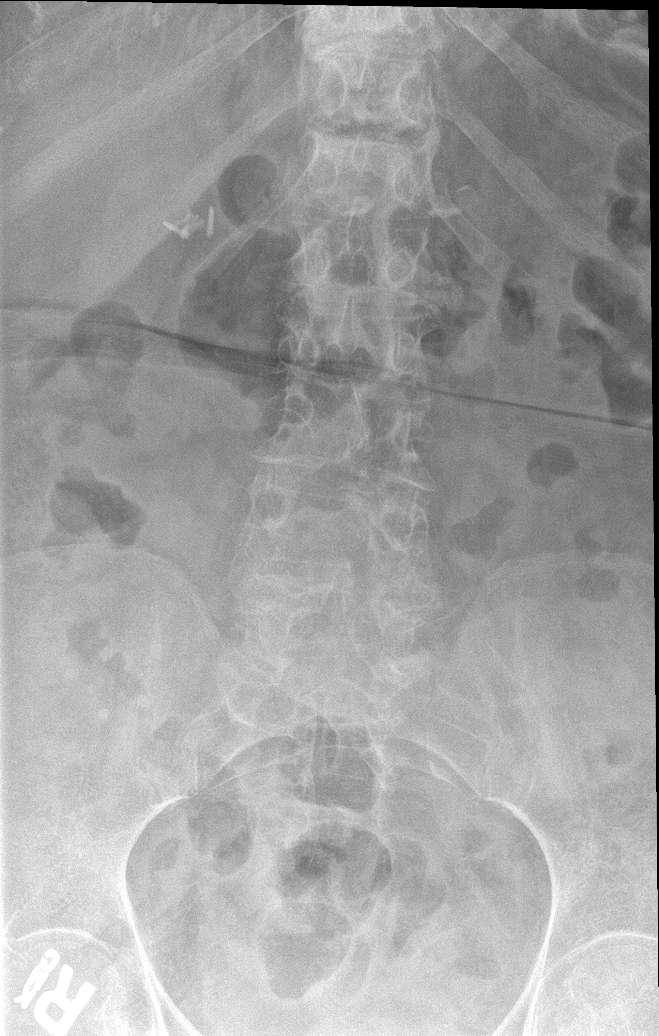

[l-spine obl (1 of 2)]
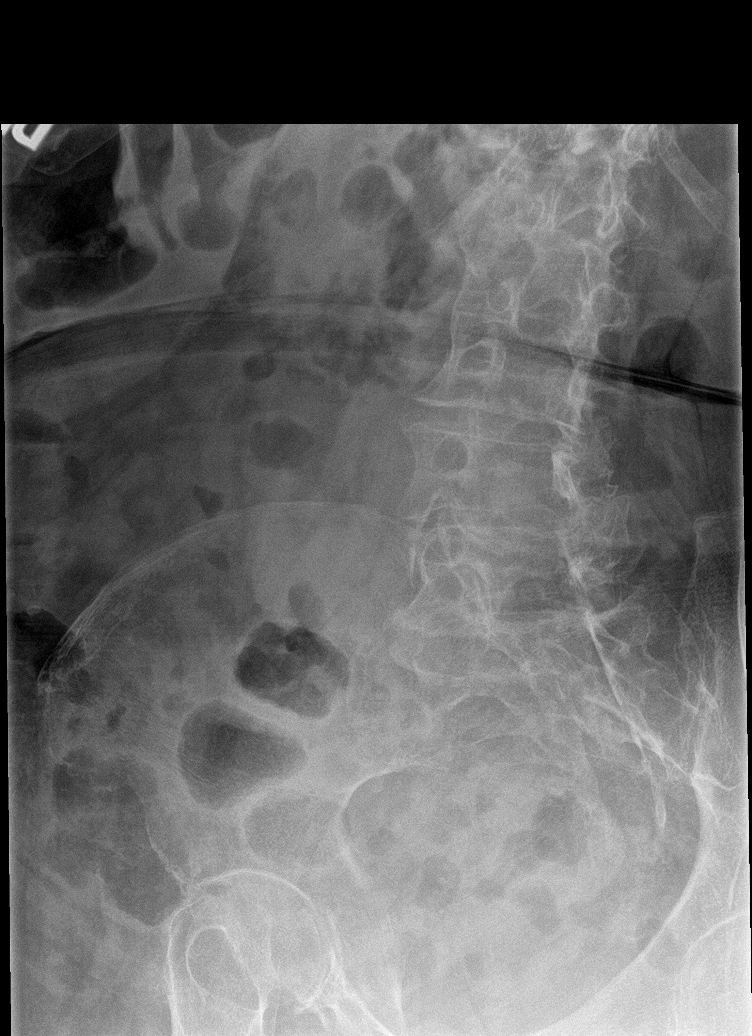

[l-spine obl (2 of 2)]
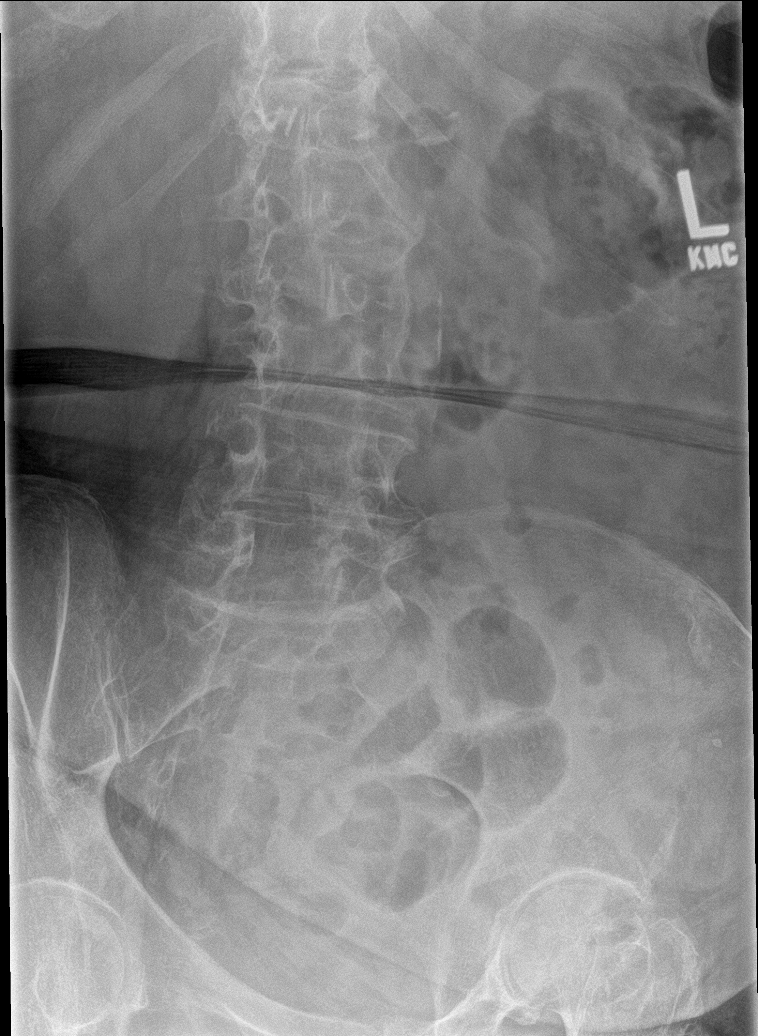

[l-spine lat]
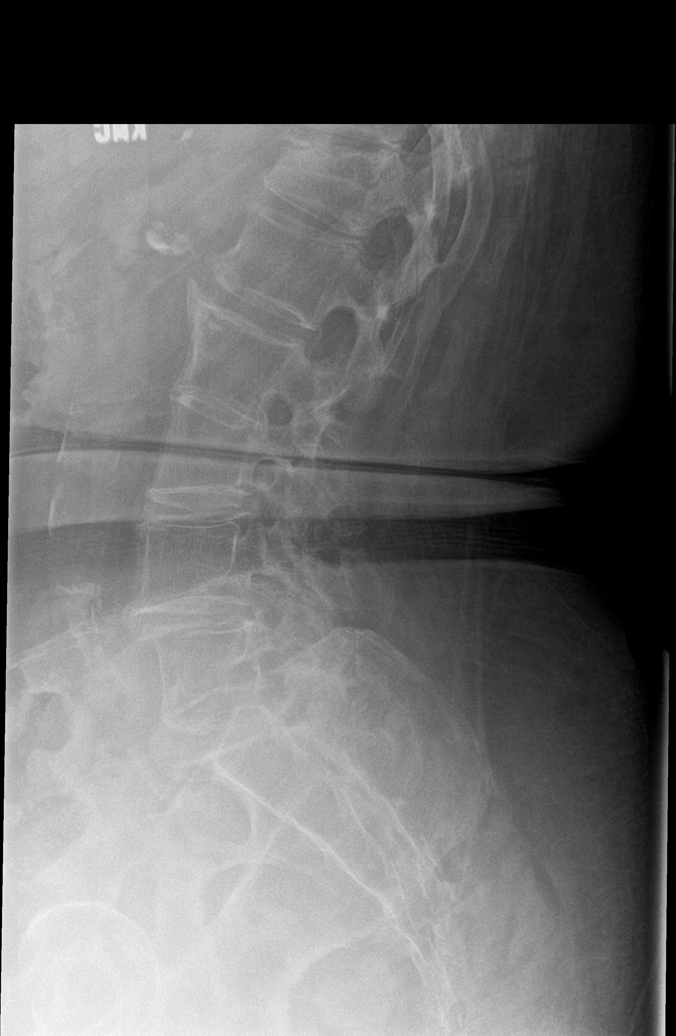

[l-spine spot]
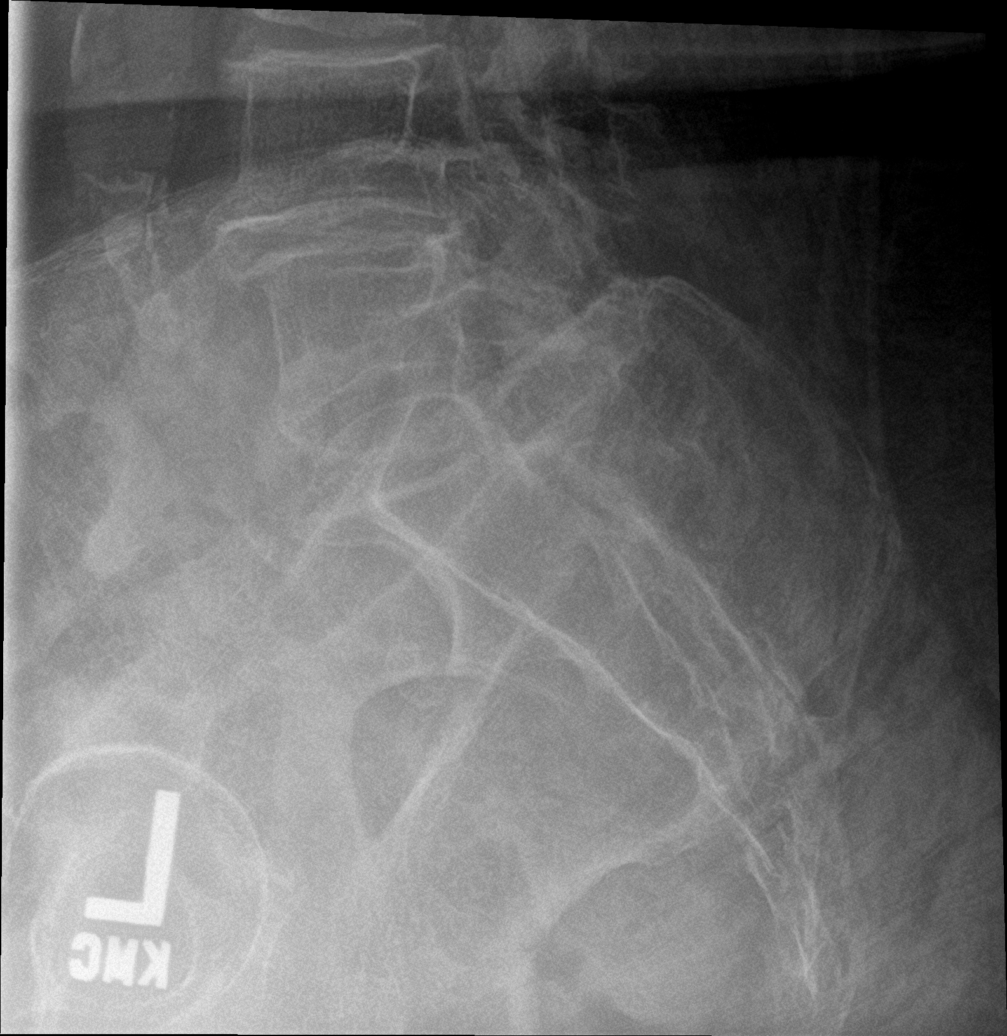

[5 of 5 positions shown; findings below may reference images not displayed]

FINDINGS: Degenerative changes are seen throughout the lumbar spine. There is
congenital/developmental fusion of L2-3 associated with superior
osteophyte on L2. The appearance is stable. There is an interval and
possibly acute anterior compression fracture of T12 with loss of
anterior height by approximately 30%. No retropulsion. No suspicious
lytic or blastic lesions are identified. There is atherosclerosis of
the abdominal aorta.
IMPRESSION: 1. Interval and possibly acute anterior compression fracture of T12.
2. Degenerative changes in the lumbar spine.

## 2020-05-14 IMAGING — MR MR THORACIC SPINE W/O CM
4 of 7 series · 13 of 48 positions shown · non-contrast
Comparison: 11/26/2016 chest CT

CLINICAL DATA: Upper back pain for 2 years.

EXAM:
MRI THORACIC SPINE WITHOUT CONTRAST
TECHNIQUE: Multiplanar, multisequence MR imaging of the thoracic spine was
performed. No intravenous contrast was administered.

[Series 6: T1 · sagittal · 4.0mm · 0.62mm/px · 3 of 13 slices shown]
[im 1/13]
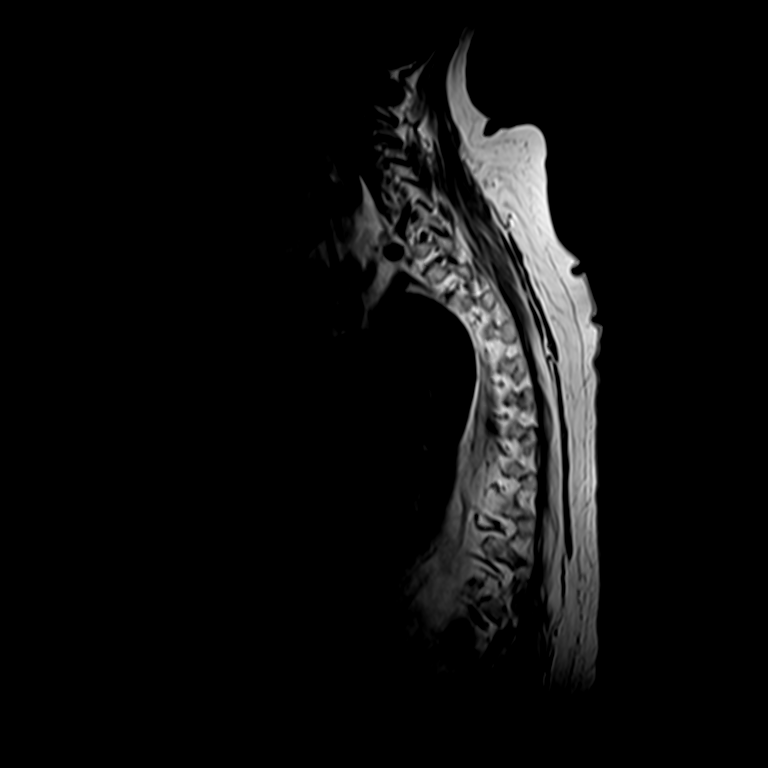
[im 9/13]
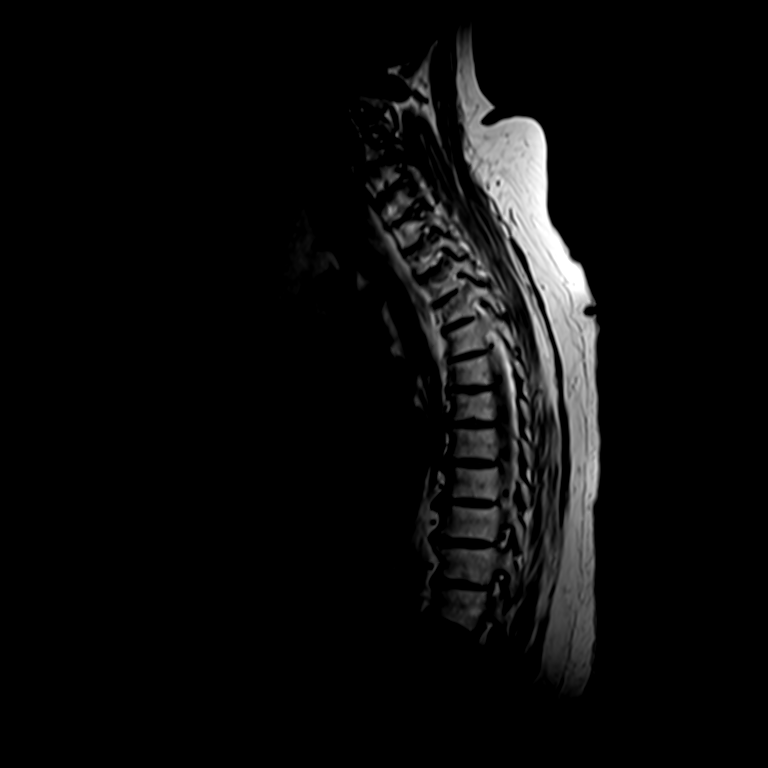
[im 13/13]
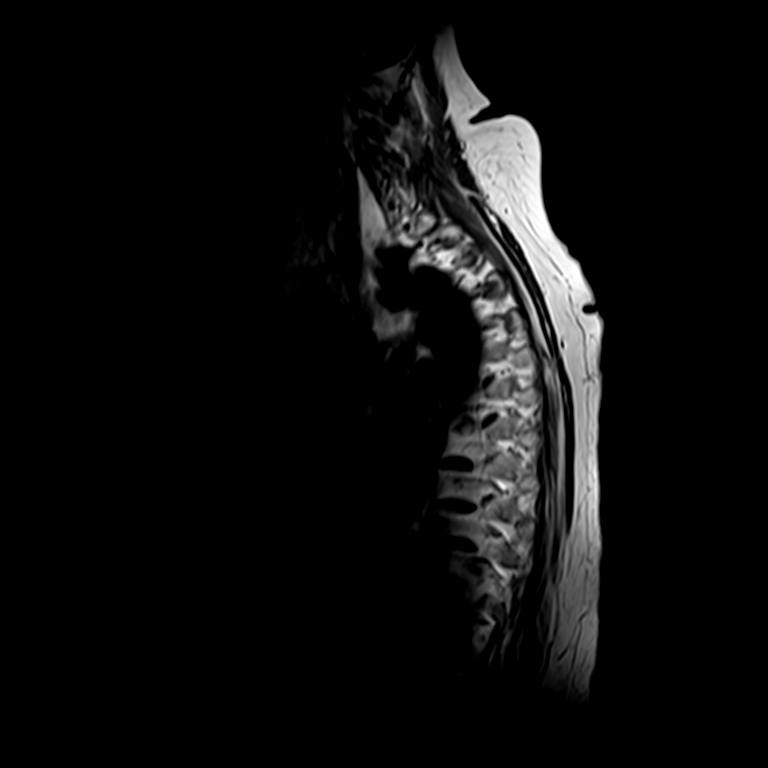

[Series 8: T2 · sagittal · 4.0mm · 0.55mm/px · 4 of 13 slices shown (1 of 3)]
[im 1/13]
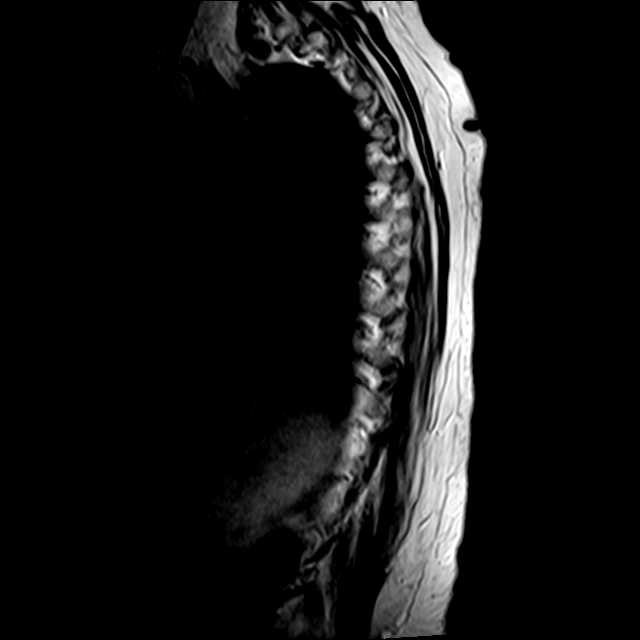
[im 5/13]
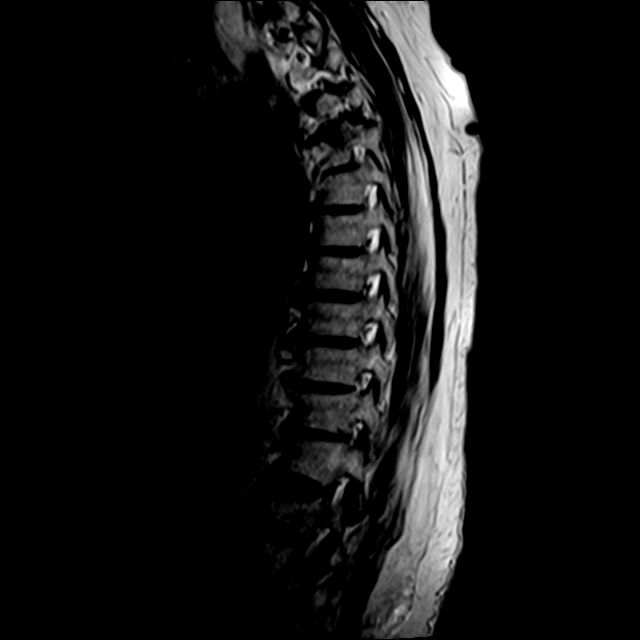
[im 9/13]
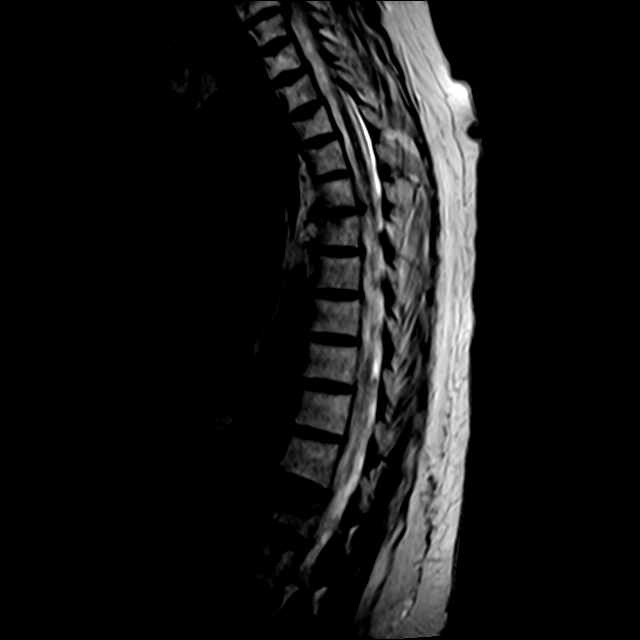
[im 13/13]
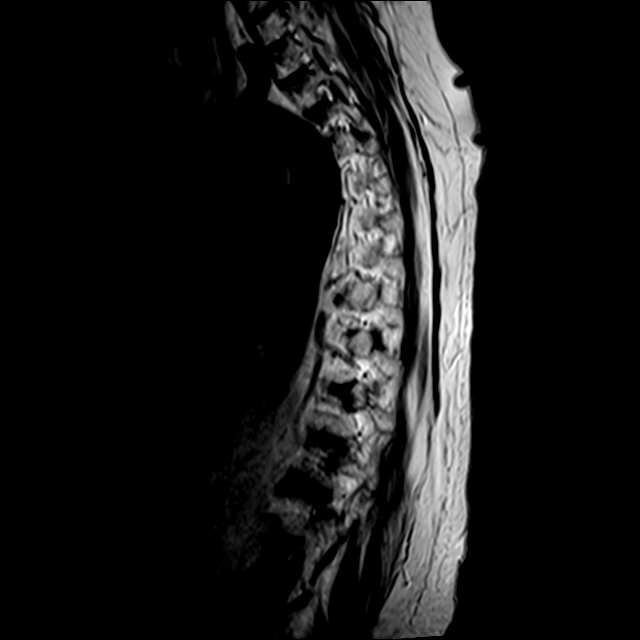

[Series 12: T2 · axial · 3.0mm · 0.24mm/px · z∈[-314,-128]mm · 3 of 48 slices shown (2 of 3)]
[im 8/48]
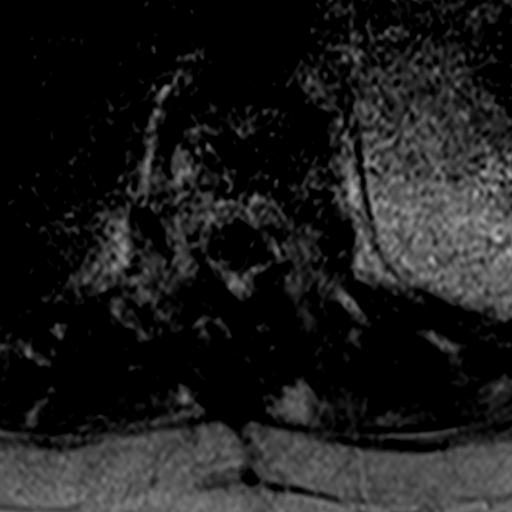
[im 26/48]
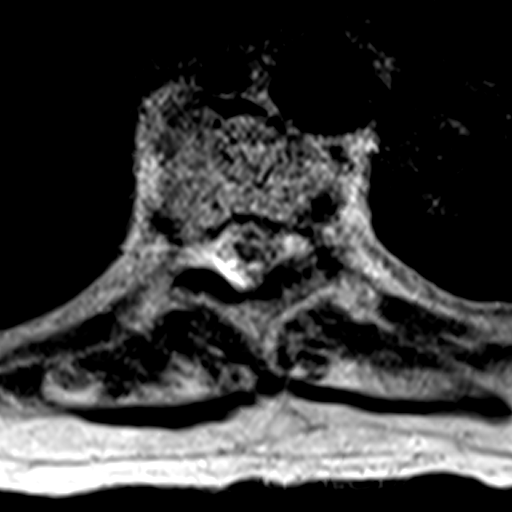
[im 40/48]
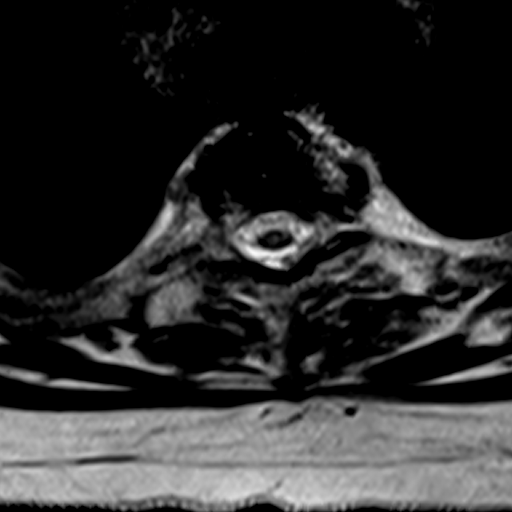

[Series 13: T2 · sagittal · 4.0mm · 0.55mm/px · 3 of 13 slices shown (3 of 3)]
[im 1/13]
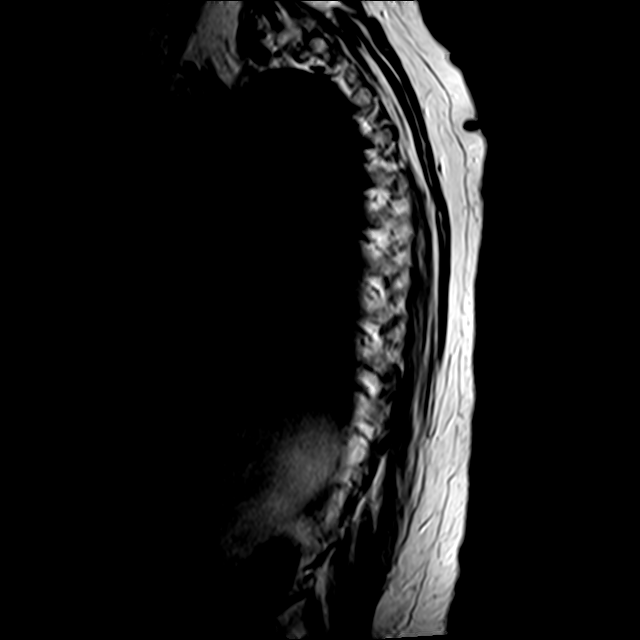
[im 9/13]
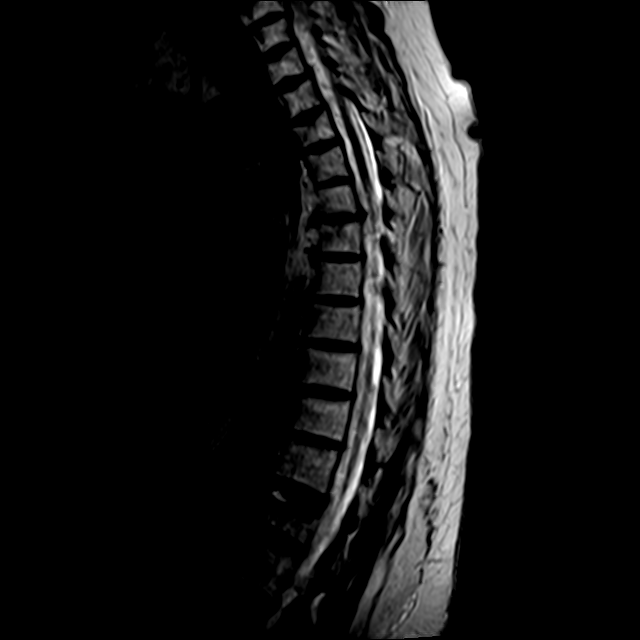
[im 13/13]
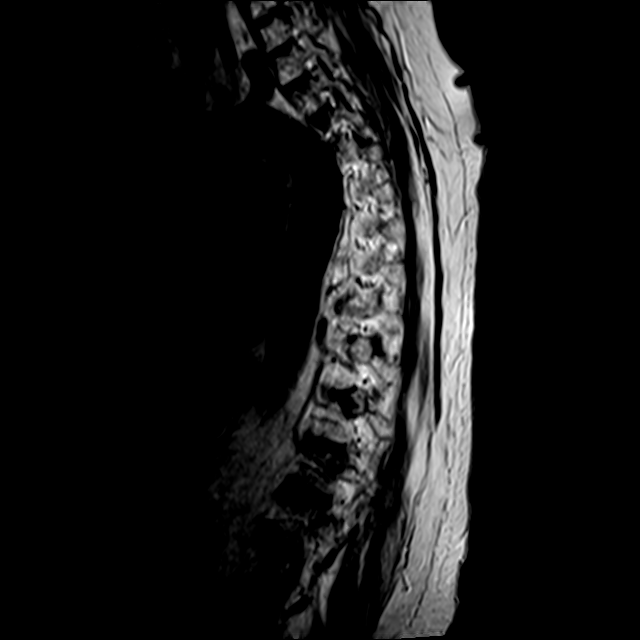

[13 of 48 positions shown; findings below may reference images not displayed]

FINDINGS: Alignment:  Mild exaggerated thoracic kyphosis.  No listhesis.

Vertebrae: T12 (numbered from above) body marrow edema following a
horizontal fracture cleft containing fluid and gas. Height loss is
mild (
30%). The fracture is nonacute based on chest CT 1 month
ago but not healed. No evidence of underlying bone lesion. No
retropulsion.

Remote T1, T2, T3, and T4 superior endplate fractures with minor
depression. Small scattered Schmorl's nodes.

Cord:  Normal signal and morphology, as permitted by motion artifact

Paraspinal and other soft tissues: Negative

Disc levels:

Generalized spondylosis and disc narrowing with minor bulging. No
cord impingement. The foramina are diffusely patent
IMPRESSION: 1. T12 subacute compression fracture. Given well-defined fluid and
gas-filled fracture cleft, Kummell disease should be considered. No
retropulsion.
2. Motion degraded.

## 2020-05-19 DIAGNOSIS — E1142 Type 2 diabetes mellitus with diabetic polyneuropathy: Secondary | ICD-10-CM | POA: Diagnosis not present

## 2020-05-19 DIAGNOSIS — E7800 Pure hypercholesterolemia, unspecified: Secondary | ICD-10-CM | POA: Diagnosis not present

## 2020-05-19 DIAGNOSIS — E559 Vitamin D deficiency, unspecified: Secondary | ICD-10-CM | POA: Diagnosis not present

## 2020-05-19 DIAGNOSIS — E039 Hypothyroidism, unspecified: Secondary | ICD-10-CM | POA: Diagnosis not present

## 2020-05-19 DIAGNOSIS — E782 Mixed hyperlipidemia: Secondary | ICD-10-CM | POA: Diagnosis not present

## 2020-05-19 DIAGNOSIS — E119 Type 2 diabetes mellitus without complications: Secondary | ICD-10-CM | POA: Diagnosis not present

## 2020-05-19 DIAGNOSIS — F419 Anxiety disorder, unspecified: Secondary | ICD-10-CM | POA: Diagnosis not present

## 2020-05-19 DIAGNOSIS — I10 Essential (primary) hypertension: Secondary | ICD-10-CM | POA: Diagnosis not present

## 2020-05-19 DIAGNOSIS — I5032 Chronic diastolic (congestive) heart failure: Secondary | ICD-10-CM | POA: Diagnosis not present

## 2020-05-19 DIAGNOSIS — E1165 Type 2 diabetes mellitus with hyperglycemia: Secondary | ICD-10-CM | POA: Diagnosis not present

## 2020-05-19 DIAGNOSIS — D509 Iron deficiency anemia, unspecified: Secondary | ICD-10-CM | POA: Diagnosis not present

## 2020-05-20 ENCOUNTER — Ambulatory Visit: Payer: PPO | Admitting: Cardiology

## 2020-05-20 NOTE — Progress Notes (Deleted)
Clinical Summary Ms. Mandelbaum is a 69 y.o.female seen today for follow up of the following medical problems.   1. Chronicsystolic diastolic CHF with mixed precap and post postcap Pulmonary Hypertension - echo 12/2013 PASP 85, moderate TR, RV mild to moderately dilated with normal function, could not eval diasotlic function due to afib, LVEF 50-55%. Biatrial enlargement.  - RHC 01/2015 PA 38/13 and calculated mean of 21, could not get a wedge however LVEDP 15. - repeat echo 10/2014 PASP 61 mmHg (down from 85 on prior echo), some evidence of RV dysfunction with RV TAPSE 1.4 and tissue anular systolic velocity of 7.5. Cannot evaluate diastolic function, however severe biatrial enlarement suggests significant dysfunction, - 01/2015 RHC mean PA 52, PCWP 31 - 01/2015 echo LVEF 55-60%, PASP 46  - after most recent RHC in 01/2015 she was admitted for diuresis. Discharge weight 170 lbs, reportedly down from 190 on admission. Marland Kitchen Discharged on lasix 60mg  bid.     - last visit we increased torsemide to 40mg  bid - repeat labs showed stable renal function - weight is down 232 lbs. Still some swelling right leg which is chronic, no abdominal distension. No significant SOB/DOE  - had uptrend in Cr, on 04/16/20 we lowered torsemide to 40 mg in AM and 20mg  PM   2. Afib - amio started 02/2015.  - has had severe diffusion dfect on PFTs 10/2016prior  - no recent palpitations. Family noted an isolated heart rate by vitals check of 117, no recurrent tachycardia.     3. OSA - could not tolerate CPAP  4. AMS -ER visit 11/16/19, found to have UTI. CXR suspected atypical pneumonia - Cr elevated to 1.25 in ER.   5. Fatigue - recent fatigue, feeling drowsy - is to f/u with pcp     SH: completed her covid vaccine x2.   Past Medical History:  Diagnosis Date  . Abnormal pulmonary function test   . Anemia    H/H of 10/30 with a normal MCV in 12/09  . Anxiety   . Arthritis    . Barrett's esophagus    Diagnosed 1995. Last EGD 2016-NO BARRETT'S.   . Chest pain    Negative cardiac catheterization in 2002; negative stress nuclear study in 2008  . Chronic anticoagulation   . Chronic combined systolic and diastolic CHF (congestive heart failure) (HCC)    a. EF predominantly normal during prior echoes but was 40% during 10/2014 echo. b. Most recent 01/2015 EF normal, 55-60%.  . Chronic LBP    Surgical intervention in 1996  . Diabetes mellitus, type 2 (HCC)    Insulin therapy; exacerbated by prednisone  . Dysrhythmia    AFib  . Gastroparesis    99% retention 05/2008 on GES  . GERD (gastroesophageal reflux disease)   . Hiatal hernia   . Hyperlipidemia   . Hypertension   . Hypothyroid   . IBS (irritable bowel syndrome)   . Obesity   . OSA on CPAP    had CPAP and cannot tolerate.  . Paroxysmal atrial fibrillation (HCC)   . Pulmonary hypertension (HCC) 01/2015   a. Predominantly pulmonary venous hypertension but may be component of PAH.  . Seizures (HCC)    last seizure was 2 years ago; on keppra for this; unknown etiology  . Syncope    a. Admitted 05/2009; magnetic resonance imagin/ MRA - negative; etiology thought to be orthostasis secondary to drugs and dehydration. b. Syncope 02/2015 also felt 2/2 dehydration.  Allergies  Allergen Reactions  . Celexa [Citalopram Hydrobromide] Other (See Comments)    Dyskinesia  . Codeine Nausea And Vomiting and Other (See Comments)    HALLUCINATIONS  . Dilaudid [Hydromorphone Hcl] Other (See Comments)    Made her pass out  . Reglan [Metoclopramide] Other (See Comments)    DYSKINESIA  . Hyoscyamine     MADE DIARRHEA WORSE     Current Outpatient Medications  Medication Sig Dispense Refill  . acetaminophen (TYLENOL) 500 MG tablet Take 500 mg by mouth as needed for moderate pain or headache.     Marland Kitchen amiodarone (PACERONE) 200 MG tablet TAKE 1 TABLET BY MOUTH EVERY MORNING 30 tablet 9  . apixaban (ELIQUIS) 5 MG TABS  tablet Take 1 tablet (5 mg total) by mouth 2 (two) times daily. 60 tablet 11  . Cholecalciferol (VITAMIN D3) 2000 units capsule Take 2,000 Units by mouth daily.  11  . dicyclomine (BENTYL) 20 MG tablet 1 PO QAC TID (Patient taking differently: Take 20 mg by mouth 3 (three) times daily before meals. 1 PO QAC TID) 90 tablet 11  . DULoxetine (CYMBALTA) 30 MG capsule Take 30 mg by mouth 2 (two) times daily.  0  . gabapentin (NEURONTIN) 400 MG capsule Take by mouth 3 (three) times daily. Take 1 capsule(400mg ) 3 times daily.  11  . glipiZIDE (GLUCOTROL) 10 MG tablet Take 10 mg by mouth 2 (two) times daily.  11  . HYDROcodone-acetaminophen (NORCO) 10-325 MG tablet Take 1 tablet by mouth at bedtime. Takes 1/2 tablet at bedtime.    . insulin glargine (LANTUS) 100 UNIT/ML injection Inject 0.55 mLs (55 Units total) into the skin at bedtime. 10 mL 0  . Lactase 9000 units CHEW 1 PO Q4H WHEN YOU EAT ICE CREAM OR CHEESE (Patient taking differently: Chew 1 tablet by mouth every 4 (four) hours. 1 PO Q4H WHEN YOU EAT ICE CREAM OR CHEESE) 90 tablet 11  . levETIRAcetam (KEPPRA) 1000 MG tablet Take 1,500 mg by mouth 2 (two) times daily.   1  . levothyroxine (SYNTHROID) 175 MCG tablet Take 1 tablet by mouth daily.    Marland Kitchen loperamide (IMODIUM) 2 MG capsule 1 PO Q4H PRN LOOSE STOOLS (Patient taking differently: Take 4 mg by mouth every 4 (four) hours as needed. 1 PO Q4H PRN LOOSE STOOLS) 120 capsule 11  . LORazepam (ATIVAN) 1 MG tablet Take 0.5 mg by mouth at bedtime as needed for anxiety or sleep. for anxiety  5  . magnesium oxide (MAG-OX) 400 MG tablet Take 1 tablet (400 mg total) by mouth 2 (two) times daily. 180 tablet 1  . meclizine (ANTIVERT) 25 MG tablet Take 25 mg by mouth as needed.     . metFORMIN (GLUCOPHAGE) 500 MG tablet Take 500 mg by mouth 2 (two) times daily with a meal.    . Multiple Vitamin (MULTIVITAMIN WITH MINERALS) TABS tablet Take 1 tablet by mouth daily.    . naproxen sodium (ALEVE) 220 MG tablet Take  440 mg by mouth as needed (pain).     . ondansetron (ZOFRAN) 4 MG tablet TAKE 1 TABLET BY MOUTH FOUR TIMES DAILY AS NEEDED FOR NAUSEA (Patient taking differently: Take 4 mg by mouth 4 (four) times daily as needed for nausea. ) 120 tablet 1  . OXYGEN 2 L daily.     . pantoprazole (PROTONIX) 40 MG tablet TAKE ONE TABLET BY MOUTH TWICE DAILY BEFORE APPLY MEAL 180 tablet 3  . potassium chloride (KLOR-CON) 10 MEQ tablet Take  20 mEq by mouth daily. Take 2 tablets (20 meq) daily    . torsemide (DEMADEX) 20 MG tablet Take 40 mg am and 20 mg pm, if significant swelling, can take 40 mg twice a day for 2-3 days 180 tablet 3   No current facility-administered medications for this visit.     Past Surgical History:  Procedure Laterality Date  . BACK SURGERY  1996  . BIOPSY N/A 11/08/2013   Procedure: BIOPSY  / Tissue sampling / ulcers present in small intestine;  Surgeon: West Bali, MD;  Location: [facility];  Service: Endoscopy;  Laterality: N/A;  . CARDIAC CATHETERIZATION  2002  . CARDIAC CATHETERIZATION N/A 01/26/2015   Procedure: Right Heart Cath;  Surgeon: Laurey Morale, MD;  Location[facility] INVASIVE CV LAB;  Service: Cardiovascular;  Laterality: N/A;  . CARDIOVASCULAR STRESS TEST  2008   Stress nuclear study  . CARDIOVERSION N/A 03/06/2015   Procedure: CARDIOVERSION;  Surgeon: Laurey Morale, MD;  Location: [facility] ENDOSCOPY;  Service: Cardiovascular;  Laterality: N/A;  . CARPAL TUNNEL RELEASE  1994  . COLONOSCOPY  11/2011   Dr. Darrick Penna: Internal hemorrhoids, mild diverticulosis. Random colon biopsies negative.  . COLONOSCOPY N/A 11/08/2013   SLF: Normal mucosa in the terminal ileum/The colon IS redundant/  Moderate diverticulosis throughout the entire colon. ileum bx benign. colon bx benign  . ESOPHAGOGASTRODUODENOSCOPY  2008   Barrett's without dysplasia. esphagus dilated. antral erosions, h.pylori serologies negative.  . ESOPHAGOGASTRODUODENOSCOPY  11/2011   Dr. Darrick Penna: Barrett's esophagus, mild  gastritis, diverticulum in the second portion of the duodenum repeat EGD 3 years. Small bowel biopsies negative. Gastric biopsy show reactive gastropathy but no H. pylori. Esophageal biopsies consistent with GERD. Next EGD 11/2014  . ESOPHAGOGASTRODUODENOSCOPY N/A 11/21/2014   BJS:EGBT non-erosive gastritis/irregular z-line  . GIVENS CAPSULE STUDY  12/07/2011   Proximal small bowel, rare AVM. Distal small bowel, multiple ulcers noted  . GIVENS CAPSULE STUDY N/A 09/27/2013   Distal small bowel ulcers extending to TI.  Marland Kitchen GIVENS CAPSULE STUDY N/A 10/10/2013   Procedure: GIVENS CAPSULE STUDY;  Surgeon: West Bali, MD;  Location: [facility];  Service: Endoscopy;  Laterality: N/A;  7:30  . IR KYPHO THORACIC WITH BONE BIOPSY  02/09/2018  . KNEE ARTHROSCOPY WITH MEDIAL MENISECTOMY Right 06/09/2016   Procedure: KNEE ARTHROSCOPY WITH MEDIAL MENISECTOMY;  Surgeon: Vickki Hearing, MD;  Location: [facility] ORS;  Service: Orthopedics;  Laterality: Right;  medial and lateral menisectomy  . LAMINECTOMY  1995   L4-L5  . LAPAROSCOPIC CHOLECYSTECTOMY  1990s  . LEFT HEART CATHETERIZATION WITH CORONARY ANGIOGRAM  01/10/2014   Procedure: LEFT HEART CATHETERIZATION WITH CORONARY ANGIOGRAM;  Surgeon: Lesleigh Noe, MD;  Location: [facility] CATH LAB;  Service: Cardiovascular;;  . RIGHT HEART CATHETERIZATION N/A 01/10/2014   Procedure: RIGHT HEART CATH;  Surgeon: Lesleigh Noe, MD;  Location[facility] CATH LAB;  Service: Cardiovascular;  Laterality: N/A;  . TOTAL ABDOMINAL HYSTERECTOMY  1999  . TRACHEOSTOMY TUBE PLACEMENT N/A 09/01/2017   Procedure: TRACHEOSTOMY;  Surgeon: Drema Halon, MD;  Location: [facility] OR;  Service: ENT;  Laterality: N/A;     Allergies  Allergen Reactions  . Celexa [Citalopram Hydrobromide] Other (See Comments)    Dyskinesia  . Codeine Nausea And Vomiting and Other (See Comments)    HALLUCINATIONS  . Dilaudid [Hydromorphone Hcl] Other (See Comments)    Made her pass out  . Reglan [Metoclopramide]  Other (See Comments)    DYSKINESIA  .  Hyoscyamine     MADE DIARRHEA WORSE      Family History  Problem Relation Age of Onset  . Hypertension Mother   . Alzheimer's disease Mother   . Stroke Mother   . Heart attack Mother   . Hypertension Other   . Breast cancer Sister   . Heart disease Neg Hx   . Colon cancer Neg Hx      Social History Ms. Spannagel reports that she quit smoking about 36 years ago. Her smoking use included cigarettes. She started smoking about 54 years ago. She has a 3.75 pack-year smoking history. She has never used smokeless tobacco. Ms. Stroble reports no history of alcohol use.   Review of Systems CONSTITUTIONAL: No weight loss, fever, chills, weakness or fatigue.  HEENT: Eyes: No visual loss, blurred vision, double vision or yellow sclerae.No hearing loss, sneezing, congestion, runny nose or sore throat.  SKIN: No rash or itching.  CARDIOVASCULAR:  RESPIRATORY: No shortness of breath, cough or sputum.  GASTROINTESTINAL: No anorexia, nausea, vomiting or diarrhea. No abdominal pain or blood.  GENITOURINARY: No burning on urination, no polyuria NEUROLOGICAL: No headache, dizziness, syncope, paralysis, ataxia, numbness or tingling in the extremities. No change in bowel or bladder control.  MUSCULOSKELETAL: No muscle, back pain, joint pain or stiffness.  LYMPHATICS: No enlarged nodes. No history of splenectomy.  PSYCHIATRIC: No history of depression or anxiety.  ENDOCRINOLOGIC: No reports of sweating, cold or heat intolerance. No polyuria or polydipsia.  Marland Kitchen   Physical Examination There were no vitals filed for this visit. There were no vitals filed for this visit.  Gen: resting comfortably, no acute distress HEENT: no scleral icterus, pupils equal round and reactive, no palptable cervical adenopathy,  CV Resp: Clear to auscultation bilaterally GI: abdomen is soft, non-tender, non-distended, normal bowel sounds, no hepatosplenomegaly MSK: extremities are  warm, no edema.  Skin: warm, no rash Neuro:  no focal deficits Psych: appropriate affect   Diagnostic Studies     Assessment and Plan   1. Chronic diastolic heart failure -weights down 9 lbs since changing to torsemide 40mg  bid. Some ongonig LE edema primarily right leg which is chronic. Labs were stable, conitnue current diuretic dose and repeat BMET/Mg in 2 weeks   2. Afib -isolated high heart rate, monitor at this time - continue current meds  Antoine Poche, M.D., F.A.C.C.

## 2020-05-25 DIAGNOSIS — R413 Other amnesia: Secondary | ICD-10-CM | POA: Diagnosis not present

## 2020-05-25 DIAGNOSIS — M797 Fibromyalgia: Secondary | ICD-10-CM | POA: Diagnosis not present

## 2020-05-25 DIAGNOSIS — M25569 Pain in unspecified knee: Secondary | ICD-10-CM | POA: Diagnosis not present

## 2020-05-25 DIAGNOSIS — M130 Polyarthritis, unspecified: Secondary | ICD-10-CM | POA: Diagnosis not present

## 2020-05-25 DIAGNOSIS — R569 Unspecified convulsions: Secondary | ICD-10-CM | POA: Diagnosis not present

## 2020-06-04 DIAGNOSIS — R269 Unspecified abnormalities of gait and mobility: Secondary | ICD-10-CM | POA: Diagnosis not present

## 2020-06-04 DIAGNOSIS — I5032 Chronic diastolic (congestive) heart failure: Secondary | ICD-10-CM | POA: Diagnosis not present

## 2020-06-09 ENCOUNTER — Ambulatory Visit (HOSPITAL_COMMUNITY)
Admission: RE | Admit: 2020-06-09 | Discharge: 2020-06-09 | Disposition: A | Discharge status: Home / Self Care | Payer: PPO | Source: Ambulatory Visit | Attending: Nurse Practitioner | Admitting: Nurse Practitioner

## 2020-06-09 ENCOUNTER — Other Ambulatory Visit (HOSPITAL_COMMUNITY): Payer: Self-pay | Admitting: Nurse Practitioner

## 2020-06-09 ENCOUNTER — Other Ambulatory Visit: Payer: Self-pay

## 2020-06-09 DIAGNOSIS — W19XXXA Unspecified fall, initial encounter: Secondary | ICD-10-CM | POA: Insufficient documentation

## 2020-06-09 DIAGNOSIS — R079 Chest pain, unspecified: Secondary | ICD-10-CM | POA: Insufficient documentation

## 2020-06-09 DIAGNOSIS — R7989 Other specified abnormal findings of blood chemistry: Secondary | ICD-10-CM | POA: Diagnosis not present

## 2020-06-09 DIAGNOSIS — Z9981 Dependence on supplemental oxygen: Secondary | ICD-10-CM | POA: Diagnosis not present

## 2020-06-13 IMAGING — DX DG CHEST 2V
2 series · 2 of 2 positions shown · non-contrast
Comparison: CT 11/26/2017, radiograph 11/25/2017, 08/27/2017

CLINICAL DATA: Cough and short of breath

EXAM:
CHEST - 2 VIEW

[chest pa]
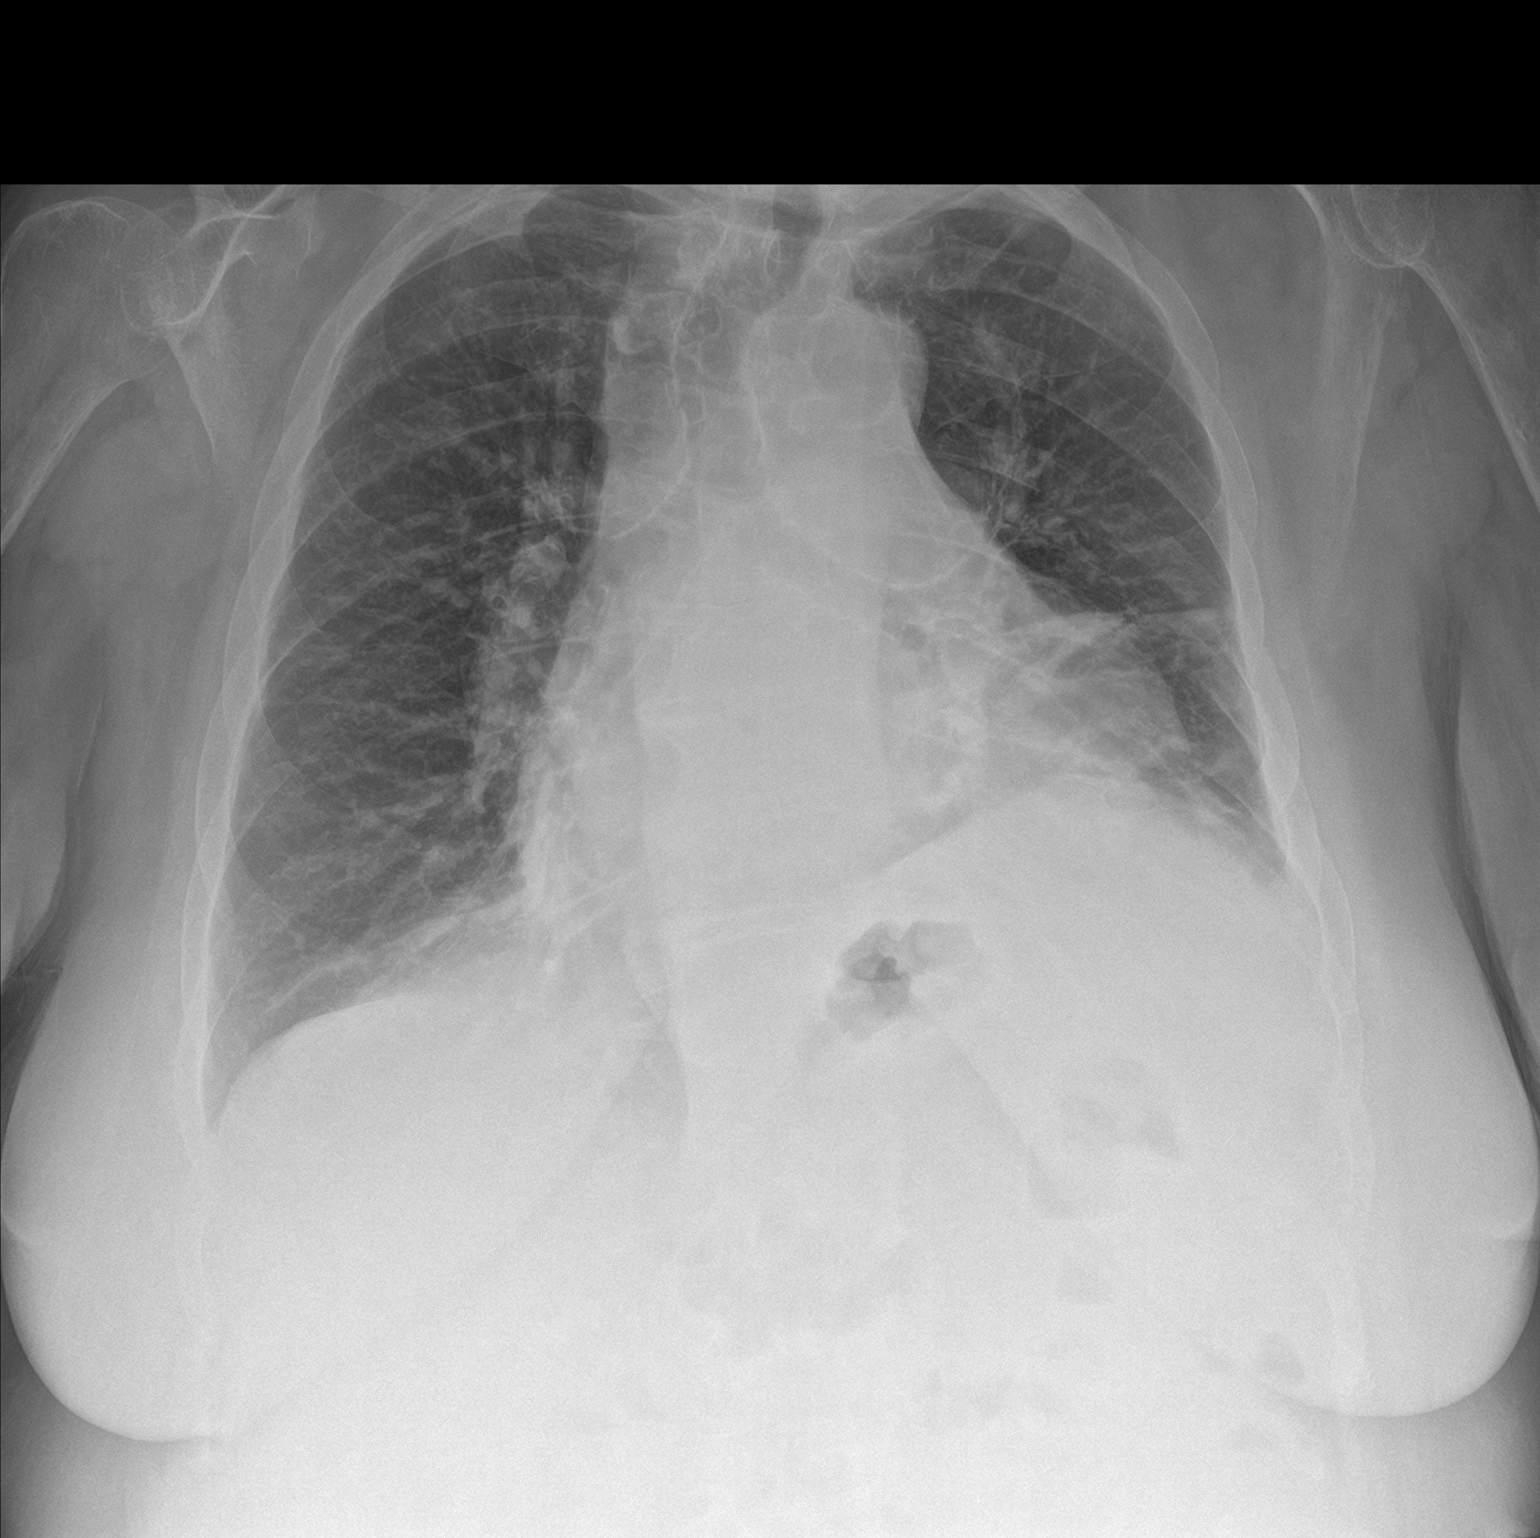

[chest lat]
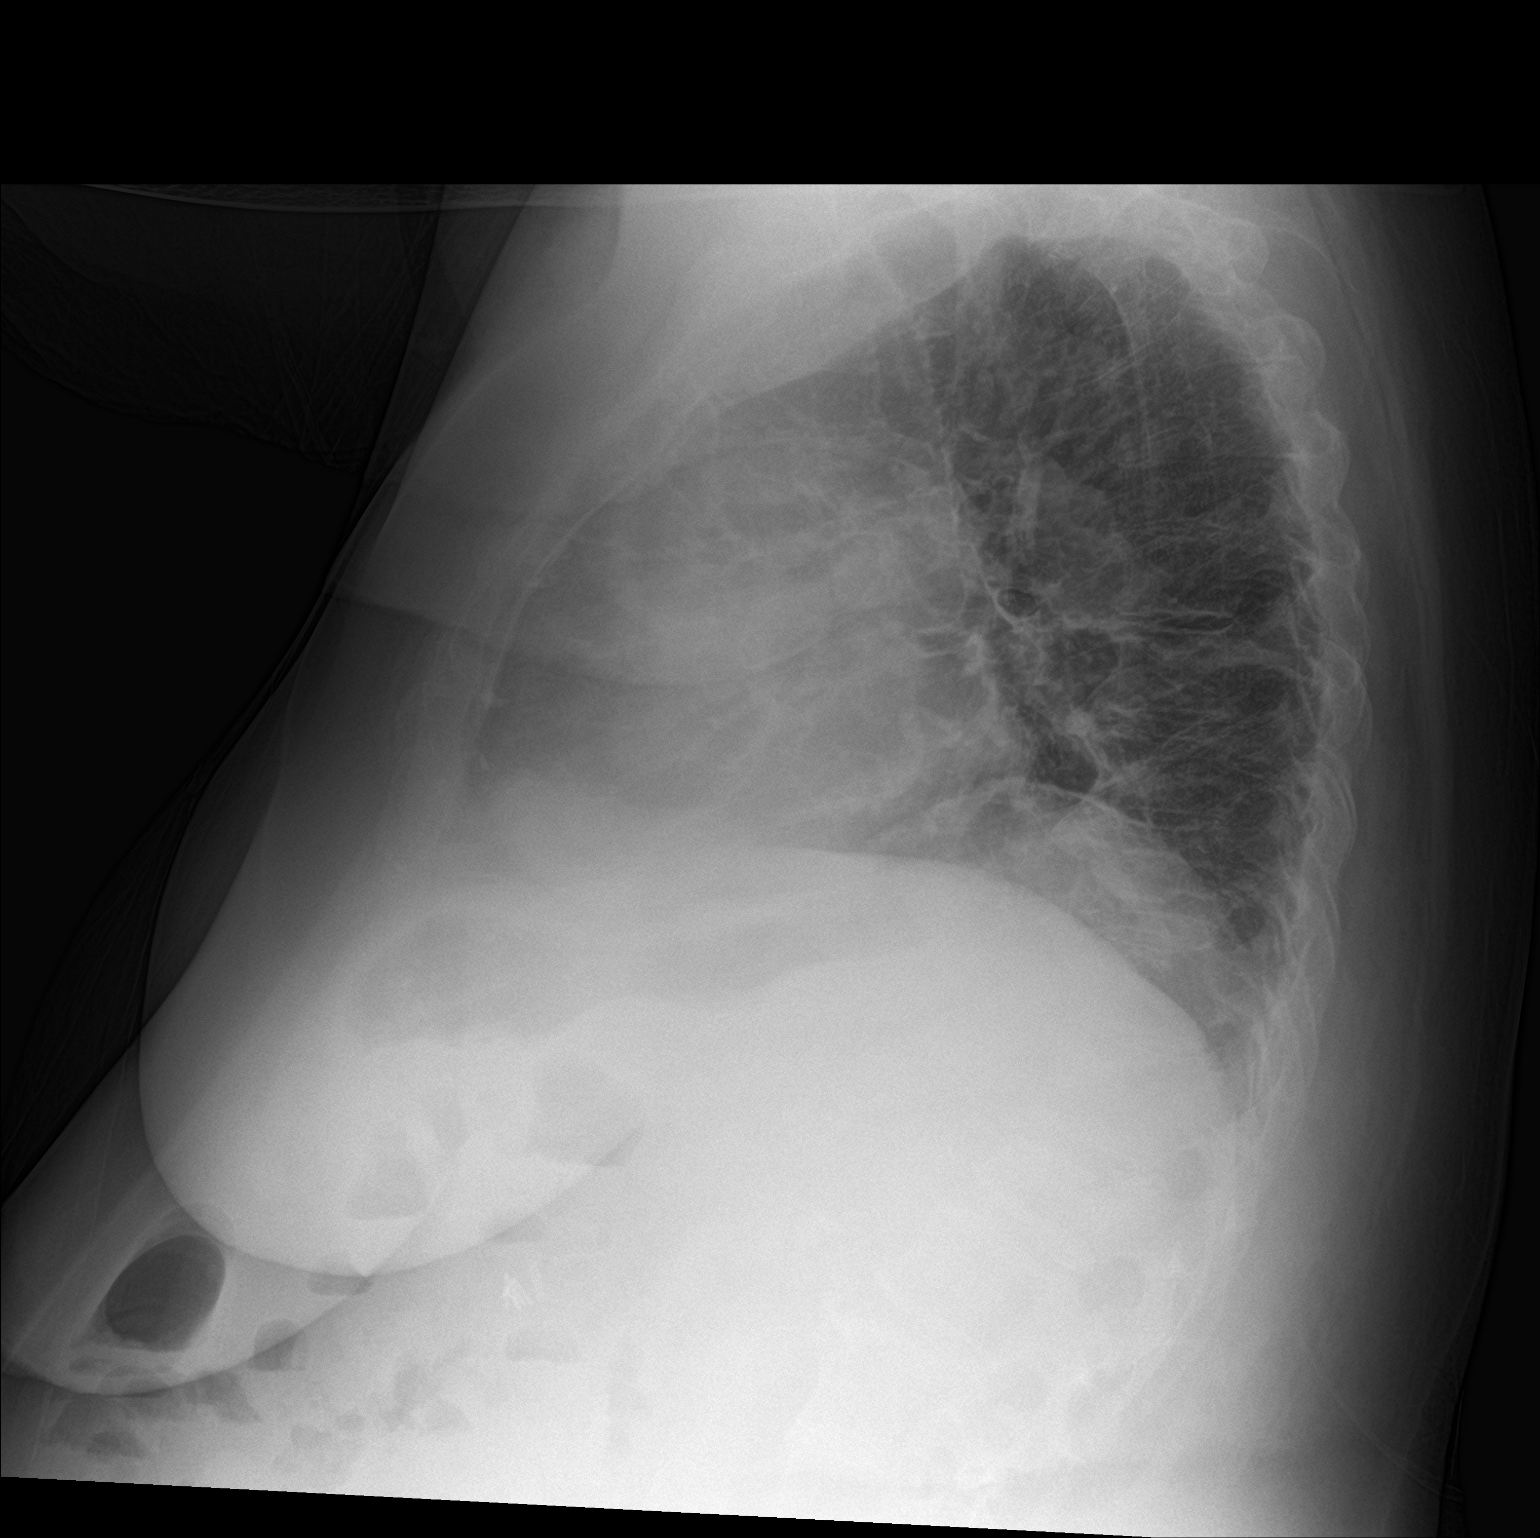

[2 of 2 positions shown; findings below may reference images not displayed]

FINDINGS: Linear scarring at the bilateral lung bases. No acute consolidation
or pleural effusion. Stable enlarged cardiomediastinal silhouette.
Aortic atherosclerosis. No pneumothorax. Degenerative changes of the
spine. Moderate compression deformity T12.
IMPRESSION: 1. Cardiomegaly
2. Stable linear scarring at both lung bases. No acute pulmonary
infiltrate.

## 2020-06-17 ENCOUNTER — Encounter: Payer: Self-pay | Admitting: Internal Medicine

## 2020-06-17 ENCOUNTER — Other Ambulatory Visit: Payer: Self-pay

## 2020-06-17 ENCOUNTER — Ambulatory Visit: Payer: PPO | Admitting: Internal Medicine

## 2020-06-17 VITALS — BP 108/72 | HR 99 | Temp 96.2°F | Ht 66.0 in | Wt 229.4 lb

## 2020-06-17 DIAGNOSIS — K580 Irritable bowel syndrome with diarrhea: Secondary | ICD-10-CM

## 2020-06-17 DIAGNOSIS — R1030 Lower abdominal pain, unspecified: Secondary | ICD-10-CM

## 2020-06-17 DIAGNOSIS — K529 Noninfective gastroenteritis and colitis, unspecified: Secondary | ICD-10-CM

## 2020-06-17 NOTE — Progress Notes (Signed)
Referring Provider: Gareth Morgan, MD Primary Care Physician:  Gareth Morgan, MD Primary GI:  Dr. Marletta Lor  Chief Complaint  Patient presents with   Abdominal Pain    Larey Seat Nov 17    HPI:   [name] is a 69 y.o. female who presents to the clinic today for follow-up visit.  She has a history of irritable bowel syndrome, diarrhea predominant.  She is currently maintained on dicyclomine and uses Lactaid which she states is helping with her diarrhea.  Her main complaint for me is chronic abdominal pain.  States this is lower abdominal pain.  Worse after meals.  States she has had food aversion due to this as well as weight loss.  No blood in her stools.  No black stools.  Last colonoscopy 2015 relatively unremarkable.  She has significant heart disease.  Past Medical History:  Diagnosis Date   Abnormal pulmonary function test    Anemia    H/H of 10/30 with a normal MCV in 12/09   Anxiety    Arthritis    Barrett's esophagus    Diagnosed 1995. Last EGD 2016-NO BARRETT'S.    Chest pain    Negative cardiac catheterization in 2002; negative stress nuclear study in 2008   Chronic anticoagulation    Chronic combined systolic and diastolic CHF (congestive heart failure) (HCC)    a. EF predominantly normal during prior echoes but was 40% during 10/2014 echo. b. Most recent 01/2015 EF normal, 55-60%.   Chronic LBP    Surgical intervention in 1996   Diabetes mellitus, type 2 (HCC)    Insulin therapy; exacerbated by prednisone   Dysrhythmia    AFib   Gastroparesis    99% retention 05/2008 on GES   GERD (gastroesophageal reflux disease)    Hiatal hernia    Hyperlipidemia    Hypertension    Hypothyroid    IBS (irritable bowel syndrome)    Obesity    OSA on CPAP    had CPAP and cannot tolerate.   Paroxysmal atrial fibrillation (HCC)    Pulmonary hypertension (HCC) 01/2015   a. Predominantly pulmonary venous hypertension but may be component of PAH.    Seizures (HCC)    last seizure was 2 years ago; on keppra for this; unknown etiology   Syncope    a. Admitted 05/2009; magnetic resonance imagin/ MRA - negative; etiology thought to be orthostasis secondary to drugs and dehydration. b. Syncope 02/2015 also felt 2/2 dehydration.    Past Surgical History:  Procedure Laterality Date   BACK SURGERY  1996   BIOPSY N/A 11/08/2013   Procedure: BIOPSY  / Tissue sampling / ulcers present in small intestine;  Surgeon: West Bali, MD;  Location: [facility];  Service: Endoscopy;  Laterality: N/A;   CARDIAC CATHETERIZATION  2002   CARDIAC CATHETERIZATION N/A 01/26/2015   Procedure: Right Heart Cath;  Surgeon: Laurey Morale, MD;  Location: [facility] INVASIVE CV LAB;  Service: Cardiovascular;  Laterality: N/A;   CARDIOVASCULAR STRESS TEST  2008   Stress nuclear study   CARDIOVERSION N/A 03/06/2015   Procedure: CARDIOVERSION;  Surgeon: Laurey Morale, MD;  Location: [facility] ENDOSCOPY;  Service: Cardiovascular;  Laterality: N/A;   CARPAL TUNNEL RELEASE  1994   COLONOSCOPY  11/2011   Dr. Darrick Penna: Internal hemorrhoids, mild diverticulosis. Random colon biopsies negative.   COLONOSCOPY N/A 11/08/2013   SLF: Normal mucosa in the terminal ileum/The colon IS redundant/  Moderate diverticulosis throughout the entire colon. ileum bx  benign. colon bx benign   ESOPHAGOGASTRODUODENOSCOPY  2008   Barrett's without dysplasia. esphagus dilated. antral erosions, h.pylori serologies negative.   ESOPHAGOGASTRODUODENOSCOPY  11/2011   Dr. Darrick Penna: Barrett's esophagus, mild gastritis, diverticulum in the second portion of the duodenum repeat EGD 3 years. Small bowel biopsies negative. Gastric biopsy show reactive gastropathy but no H. pylori. Esophageal biopsies consistent with GERD. Next EGD 11/2014   ESOPHAGOGASTRODUODENOSCOPY N/A 11/21/2014   WUJ:WJXB non-erosive gastritis/irregular z-line   GIVENS CAPSULE STUDY  12/07/2011   Proximal small bowel, rare AVM. Distal small  bowel, multiple ulcers noted   GIVENS CAPSULE STUDY N/A 09/27/2013   Distal small bowel ulcers extending to TI.   GIVENS CAPSULE STUDY N/A 10/10/2013   Procedure: GIVENS CAPSULE STUDY;  Surgeon: West Bali, MD;  Location: [facility];  Service: Endoscopy;  Laterality: N/A;  7:30   IR KYPHO THORACIC WITH BONE BIOPSY  02/09/2018   KNEE ARTHROSCOPY WITH MEDIAL MENISECTOMY Right 06/09/2016   Procedure: KNEE ARTHROSCOPY WITH MEDIAL MENISECTOMY;  Surgeon: Vickki Hearing, MD;  Location: [facility] OR[facility];  Service: Orthopedics;  Laterality: Right;  medial and lateral menisectomy   LAMINECTOMY  1995   L4-L5   LAPAROSCOPIC CHOLECYSTECTOMY  1990s   LEFT HEART CATHETERIZATION WITH CORONARY ANGIOGRAM  01/10/2014   Procedure: LEFT HEART CATHETERIZATION WITH CORONARY ANGIOGRAM;  Surgeon: Lesleigh Noe, MD;  Location: [facility] CATH LAB;  Service: Cardiovascular;;   RIGHT HEART CATHETERIZATION N/A 01/10/2014   Procedure: RIGHT HEART CATH;  Surgeon: Lesleigh Noe, MD;  Location: [facility] CATH LAB;  Service: Cardiovascular;  Laterality: N/A;   TOTAL ABDOMINAL HYSTERECTOMY  1999   TRACHEOSTOMY TUBE PLACEMENT N/A 09/01/2017   Procedure: TRACHEOSTOMY;  Surgeon: Drema Halon, MD;  Location: [facility] OR;  Service: ENT;  Laterality: N/A;    Current Outpatient Medications  Medication Sig Dispense Refill   acetaminophen (TYLENOL) 500 MG tablet Take 500 mg by mouth as needed for moderate pain or headache.      amiodarone (PACERONE) 200 MG tablet TAKE 1 TABLET BY MOUTH EVERY MORNING 30 tablet 9   apixaban (ELIQUIS) 5 MG TABS tablet Take 1 tablet (5 mg total) by mouth 2 (two) times daily. 60 tablet 11   Cholecalciferol (VITAMIN D3) 2000 units capsule Take 2,000 Units by mouth daily.  11   dicyclomine (BENTYL) 20 MG tablet 1 PO QAC TID (Patient taking differently: Take 20 mg by mouth 3 (three) times daily before meals. 1 PO QAC TID) 90 tablet 11   donepezil (ARICEPT) 5 MG tablet Take 5 mg by mouth daily.      DULoxetine (CYMBALTA) 30 MG capsule Take 30 mg by mouth 2 (two) times daily.  0   gabapentin (NEURONTIN) 400 MG capsule Take by mouth 3 (three) times daily. Take 1 capsule(400mg ) 3 times daily.  11   glipiZIDE (GLUCOTROL) 10 MG tablet Take 10 mg by mouth daily.  11   HYDROcodone-acetaminophen (NORCO) 10-325 MG tablet Take 1 tablet by mouth as directed. Takes 1/2 tablet at bedtime.     insulin glargine (LANTUS) 100 UNIT/ML injection Inject 0.55 mLs (55 Units total) into the skin at bedtime. (Patient taking differently: Inject 20-30 Units into the skin at bedtime.) 10 mL 0   Lactase 9000 units CHEW 1 PO Q4H WHEN YOU EAT ICE CREAM OR CHEESE (Patient taking differently: Chew 1 tablet by mouth every 4 (four) hours. 1 PO Q4H WHEN YOU EAT ICE CREAM OR CHEESE) 90 tablet 11   levETIRAcetam (KEPPRA) 1000  MG tablet Take 1,500 mg by mouth 2 (two) times daily.   1   levothyroxine (SYNTHROID) 175 MCG tablet Take 1 tablet by mouth daily.     loperamide (IMODIUM) 2 MG capsule 1 PO Q4H PRN LOOSE STOOLS (Patient taking differently: Take 4 mg by mouth every 4 (four) hours as needed. 1 PO Q4H PRN LOOSE STOOLS) 120 capsule 11   LORazepam (ATIVAN) 1 MG tablet Take 0.5 mg by mouth at bedtime as needed for anxiety or sleep. for anxiety  5   magnesium oxide (MAG-OX) 400 MG tablet Take 1 tablet (400 mg total) by mouth 2 (two) times daily. 180 tablet 1   meclizine (ANTIVERT) 25 MG tablet Take 25 mg by mouth as needed.      metFORMIN (GLUCOPHAGE) 500 MG tablet Take 500 mg by mouth 2 (two) times daily with a meal.     Multiple Vitamin (MULTIVITAMIN WITH MINERALS) TABS tablet Take 1 tablet by mouth daily.     naproxen sodium (ALEVE) 220 MG tablet Take 440 mg by mouth as needed (pain).      ondansetron (ZOFRAN) 4 MG tablet TAKE 1 TABLET BY MOUTH FOUR TIMES DAILY AS NEEDED FOR NAUSEA (Patient taking differently: Take 4 mg by mouth 4 (four) times daily as needed for nausea.) 120 tablet 1   OXYGEN 2 L daily.       pantoprazole (PROTONIX) 40 MG tablet TAKE ONE TABLET BY MOUTH TWICE DAILY BEFORE APPLY MEAL 180 tablet 3   potassium chloride (KLOR-CON) 10 MEQ tablet Take 20 mEq by mouth daily. Take 2 tablets (20 meq) daily     torsemide (DEMADEX) 20 MG tablet Take 40 mg am and 20 mg pm, if significant swelling, can take 40 mg twice a day for 2-3 days 180 tablet 3   No current facility-administered medications for this visit.    Allergies as of 06/17/2020 - Review Complete 06/17/2020  Allergen Reaction Noted   Celexa [citalopram hydrobromide] Other (See Comments) 06/30/2013   Codeine Nausea And Vomiting and Other (See Comments)    Dilaudid [hydromorphone hcl] Other (See Comments) 12/31/2015   Reglan [metoclopramide] Other (See Comments) 06/04/2014   Hyoscyamine  09/05/2019    Family History  Problem Relation Age of Onset   Hypertension Mother    Alzheimer's disease Mother    Stroke Mother    Heart attack Mother    Hypertension Other    Breast cancer Sister    Heart disease Neg Hx    Colon cancer Neg Hx     Social History   Socioeconomic History   Marital status: Married    Spouse name: Not on file   Number of children: Not on file   Years of education: Not on file   Highest education level: Not on file  Occupational History   Occupation: Passenger transport manager at [employer]    Employer: [employer]    Comment: disabled    Employer: RETIRED  Tobacco Use   Smoking status: Former Smoker    Packs/day: 0.25    Years: 15.00    Pack years: 3.75    Types: Cigarettes    Start date: 02/26/1966    Quit date: 07/01/1983    Years since quitting: 36.9   Smokeless tobacco: Never Used   Tobacco comment: quit in 1984  Vaping Use   Vaping Use: Never used  Substance and Sexual Activity   Alcohol use: No    Alcohol/week: 0.0 standard drinks   Drug use: No   Sexual activity: Yes  Birth control/protection: None, Surgical  Other Topics Concern   Not on file  Social History Narrative    Sedentary   4 children, "blended family"   Social Determinants of Health   Financial Resource Strain: Not on file  Food Insecurity: Not on file  Transportation Needs: Not on file  Physical Activity: Not on file  Stress: Not on file  Social Connections: Not on file    Subjective: Review of Systems  Constitutional: Negative for chills and fever.  HENT: Negative for congestion and hearing loss.   Eyes: Negative for blurred vision and double vision.  Respiratory: Negative for cough and shortness of breath.   Cardiovascular: Negative for chest pain and palpitations.  Gastrointestinal: Positive for abdominal pain and diarrhea. Negative for blood in stool, constipation, heartburn, melena and vomiting.  Genitourinary: Negative for dysuria and urgency.  Musculoskeletal: Negative for joint pain and myalgias.  Skin: Negative for itching and rash.  Neurological: Negative for dizziness and headaches.  Psychiatric/Behavioral: Negative for depression. The patient is not nervous/anxious.      Objective: BP 108/72    Pulse 99    Temp (!) 96.2 F (35.7 C) (Temporal)    Ht 5\' 6"  (1.676 m)    Wt 229 lb 6.4 oz (104.1 kg)    BMI 37.03 kg/m  Physical Exam Constitutional:      Appearance: Normal appearance. She is obese.  HENT:     Head: Normocephalic and atraumatic.  Eyes:     Extraocular Movements: Extraocular movements intact.     Conjunctiva/sclera: Conjunctivae normal.  Cardiovascular:     Rate and Rhythm: Normal rate and regular rhythm.  Pulmonary:     Effort: Pulmonary effort is normal.     Breath sounds: Normal breath sounds.  Abdominal:     General: Bowel sounds are normal.     Palpations: Abdomen is soft.  Musculoskeletal:        General: No swelling. Normal range of motion.     Cervical back: Normal range of motion and neck supple.  Skin:    General: Skin is warm and dry.     Coloration: Skin is not jaundiced.  Neurological:     General: No focal deficit present.      Mental Status: She is alert and oriented to person, place, and time.  Psychiatric:        Mood and Affect: Mood normal.        Behavior: Behavior normal.      Assessment: *Irritable bowel syndrome-diarrhea predominant *Lower abdominal pain, progressively worsening *Weight loss *Food aversion  Plan: In regards to patient's irritable bowel syndrome, this is relatively well controlled on dicyclomine and Lactaid.  We will continue for now.  Previous colonoscopy in 2015 was relatively unremarkable.  Her abdominal pain is concerning as this has progressively worsened over time.  Also notes food aversion as well as weight loss.  I am concerned given her vascular history that she may have chronic mesenteric ischemia.  Will order a mesenteric ultrasound to further evaluate.  Possible she may need CT angiogram in the future.  Patient follow-up in 4 to 6 weeks or sooner if needed.  06/17/2020 11:20 AM   Disclaimer: This note was dictated with voice recognition software. Similar sounding words can inadvertently be transcribed and may not be corrected upon review.

## 2020-06-17 NOTE — Progress Notes (Signed)
Referring Provider: Gareth Morgan, MD Primary Care Physician:  Gareth Morgan, MD Primary GI:  Dr. Marletta Lor  Chief Complaint  Patient presents with  . Abdominal Pain    Larey Seat Nov 17    HPI:   [name] is a 69 y.o. female who presents to the clinic today for follow-up visit.  She has a history of irritable bowel syndrome, diarrhea predominant.  She is currently maintained on dicyclomine and uses Lactaid which she states is helping with her diarrhea.  Her main complaint for me is chronic abdominal pain.  States this is lower abdominal pain.  Worse after meals.  States she has had food aversion due to this as well as weight loss.  No blood in her stools.  No black stools.  Last colonoscopy 2015 relatively unremarkable.  She has significant heart disease.   Past Medical History:  Diagnosis Date  . Abnormal pulmonary function test   . Anemia    H/H of 10/30 with a normal MCV in 12/09  . Anxiety   . Arthritis   . Barrett's esophagus    Diagnosed 1995. Last EGD 2016-NO BARRETT'S.   . Chest pain    Negative cardiac catheterization in 2002; negative stress nuclear study in 2008  . Chronic anticoagulation   . Chronic combined systolic and diastolic CHF (congestive heart failure) (HCC)    a. EF predominantly normal during prior echoes but was 40% during 10/2014 echo. b. Most recent 01/2015 EF normal, 55-60%.  . Chronic LBP    Surgical intervention in 1996  . Diabetes mellitus, type 2 (HCC)    Insulin therapy; exacerbated by prednisone  . Dysrhythmia    AFib  . Gastroparesis    99% retention 05/2008 on GES  . GERD (gastroesophageal reflux disease)   . Hiatal hernia   . Hyperlipidemia   . Hypertension   . Hypothyroid   . IBS (irritable bowel syndrome)   . Obesity   . OSA on CPAP    had CPAP and cannot tolerate.  . Paroxysmal atrial fibrillation (HCC)   . Pulmonary hypertension (HCC) 01/2015   a. Predominantly pulmonary venous hypertension but may be component of PAH.  .  Seizures (HCC)    last seizure was 2 years ago; on keppra for this; unknown etiology  . Syncope    a. Admitted 05/2009; magnetic resonance imagin/ MRA - negative; etiology thought to be orthostasis secondary to drugs and dehydration. b. Syncope 02/2015 also felt 2/2 dehydration.    Past Surgical History:  Procedure Laterality Date  . BACK SURGERY  1996  . BIOPSY N/A 11/08/2013   Procedure: BIOPSY  / Tissue sampling / ulcers present in small intestine;  Surgeon: West Bali, MD;  Location: [facility];  Service: Endoscopy;  Laterality: N/A;  . CARDIAC CATHETERIZATION  2002  . CARDIAC CATHETERIZATION N/A 01/26/2015   Procedure: Right Heart Cath;  Surgeon: Laurey Morale, MD;  Location: [facility] INVASIVE CV LAB;  Service: Cardiovascular;  Laterality: N/A;  . CARDIOVASCULAR STRESS TEST  2008   Stress nuclear study  . CARDIOVERSION N/A 03/06/2015   Procedure: CARDIOVERSION;  Surgeon: Laurey Morale, MD;  Location: [facility] ENDOSCOPY;  Service: Cardiovascular;  Laterality: N/A;  . CARPAL TUNNEL RELEASE  1994  . COLONOSCOPY  11/2011   Dr. Darrick Penna: Internal hemorrhoids, mild diverticulosis. Random colon biopsies negative.  . COLONOSCOPY N/A 11/08/2013   SLF: Normal mucosa in the terminal ileum/The colon IS redundant/  Moderate diverticulosis throughout the entire colon. ileum  bx benign. colon bx benign  . ESOPHAGOGASTRODUODENOSCOPY  2008   Barrett's without dysplasia. esphagus dilated. antral erosions, h.pylori serologies negative.  . ESOPHAGOGASTRODUODENOSCOPY  11/2011   Dr. Darrick Penna: Barrett's esophagus, mild gastritis, diverticulum in the second portion of the duodenum repeat EGD 3 years. Small bowel biopsies negative. Gastric biopsy show reactive gastropathy but no H. pylori. Esophageal biopsies consistent with GERD. Next EGD 11/2014  . ESOPHAGOGASTRODUODENOSCOPY N/A 11/21/2014   TYO:MAYO non-erosive gastritis/irregular z-line  . GIVENS CAPSULE STUDY  12/07/2011   Proximal small bowel, rare AVM. Distal small  bowel, multiple ulcers noted  . GIVENS CAPSULE STUDY N/A 09/27/2013   Distal small bowel ulcers extending to TI.  Marland Kitchen GIVENS CAPSULE STUDY N/A 10/10/2013   Procedure: GIVENS CAPSULE STUDY;  Surgeon: West Bali, MD;  Location: [facility];  Service: Endoscopy;  Laterality: N/A;  7:30  . IR KYPHO THORACIC WITH BONE BIOPSY  02/09/2018  . KNEE ARTHROSCOPY WITH MEDIAL MENISECTOMY Right 06/09/2016   Procedure: KNEE ARTHROSCOPY WITH MEDIAL MENISECTOMY;  Surgeon: Vickki Hearing, MD;  Location: [facility] ORS;  Service: Orthopedics;  Laterality: Right;  medial and lateral menisectomy  . LAMINECTOMY  1995   L4-L5  . LAPAROSCOPIC CHOLECYSTECTOMY  1990s  . LEFT HEART CATHETERIZATION WITH CORONARY ANGIOGRAM  01/10/2014   Procedure: LEFT HEART CATHETERIZATION WITH CORONARY ANGIOGRAM;  Surgeon: Lesleigh Noe, MD;  Location: [facility] CATH LAB;  Service: Cardiovascular;;  . RIGHT HEART CATHETERIZATION N/A 01/10/2014   Procedure: RIGHT HEART CATH;  Surgeon: Lesleigh Noe, MD;  Location: [facility] CATH LAB;  Service: Cardiovascular;  Laterality: N/A;  . TOTAL ABDOMINAL HYSTERECTOMY  1999  . TRACHEOSTOMY TUBE PLACEMENT N/A 09/01/2017   Procedure: TRACHEOSTOMY;  Surgeon: Drema Halon, MD;  Location: [facility] OR;  Service: ENT;  Laterality: N/A;    Current Outpatient Medications  Medication Sig Dispense Refill  . acetaminophen (TYLENOL) 500 MG tablet Take 500 mg by mouth as needed for moderate pain or headache.     Marland Kitchen amiodarone (PACERONE) 200 MG tablet TAKE 1 TABLET BY MOUTH EVERY MORNING 30 tablet 9  . apixaban (ELIQUIS) 5 MG TABS tablet Take 1 tablet (5 mg total) by mouth 2 (two) times daily. 60 tablet 11  . Cholecalciferol (VITAMIN D3) 2000 units capsule Take 2,000 Units by mouth daily.  11  . dicyclomine (BENTYL) 20 MG tablet 1 PO QAC TID (Patient taking differently: Take 20 mg by mouth 3 (three) times daily before meals. 1 PO QAC TID) 90 tablet 11  . donepezil (ARICEPT) 5 MG tablet Take 5 mg by mouth daily.    .  DULoxetine (CYMBALTA) 30 MG capsule Take 30 mg by mouth 2 (two) times daily.  0  . gabapentin (NEURONTIN) 400 MG capsule Take by mouth 3 (three) times daily. Take 1 capsule(400mg ) 3 times daily.  11  . glipiZIDE (GLUCOTROL) 10 MG tablet Take 10 mg by mouth daily.  11  . HYDROcodone-acetaminophen (NORCO) 10-325 MG tablet Take 1 tablet by mouth as directed. Takes 1/2 tablet at bedtime.    . insulin glargine (LANTUS) 100 UNIT/ML injection Inject 0.55 mLs (55 Units total) into the skin at bedtime. (Patient taking differently: Inject 20-30 Units into the skin at bedtime.) 10 mL 0  . Lactase 9000 units CHEW 1 PO Q4H WHEN YOU EAT ICE CREAM OR CHEESE (Patient taking differently: Chew 1 tablet by mouth every 4 (four) hours. 1 PO Q4H WHEN YOU EAT ICE CREAM OR CHEESE) 90 tablet 11  . levETIRAcetam (KEPPRA)  1000 MG tablet Take 1,500 mg by mouth 2 (two) times daily.   1  . levothyroxine (SYNTHROID) 175 MCG tablet Take 1 tablet by mouth daily.    Marland Kitchen loperamide (IMODIUM) 2 MG capsule 1 PO Q4H PRN LOOSE STOOLS (Patient taking differently: Take 4 mg by mouth every 4 (four) hours as needed. 1 PO Q4H PRN LOOSE STOOLS) 120 capsule 11  . LORazepam (ATIVAN) 1 MG tablet Take 0.5 mg by mouth at bedtime as needed for anxiety or sleep. for anxiety  5  . magnesium oxide (MAG-OX) 400 MG tablet Take 1 tablet (400 mg total) by mouth 2 (two) times daily. 180 tablet 1  . meclizine (ANTIVERT) 25 MG tablet Take 25 mg by mouth as needed.     . metFORMIN (GLUCOPHAGE) 500 MG tablet Take 500 mg by mouth 2 (two) times daily with a meal.    . Multiple Vitamin (MULTIVITAMIN WITH MINERALS) TABS tablet Take 1 tablet by mouth daily.    . naproxen sodium (ALEVE) 220 MG tablet Take 440 mg by mouth as needed (pain).     . ondansetron (ZOFRAN) 4 MG tablet TAKE 1 TABLET BY MOUTH FOUR TIMES DAILY AS NEEDED FOR NAUSEA (Patient taking differently: Take 4 mg by mouth 4 (four) times daily as needed for nausea.) 120 tablet 1  . OXYGEN 2 L daily.     .  pantoprazole (PROTONIX) 40 MG tablet TAKE ONE TABLET BY MOUTH TWICE DAILY BEFORE APPLY MEAL 180 tablet 3  . potassium chloride (KLOR-CON) 10 MEQ tablet Take 20 mEq by mouth daily. Take 2 tablets (20 meq) daily    . torsemide (DEMADEX) 20 MG tablet Take 40 mg am and 20 mg pm, if significant swelling, can take 40 mg twice a day for 2-3 days 180 tablet 3   No current facility-administered medications for this visit.    Allergies as of 06/17/2020 - Review Complete 06/17/2020  Allergen Reaction Noted  . Celexa [citalopram hydrobromide] Other (See Comments) 06/30/2013  . Codeine Nausea And Vomiting and Other (See Comments)   . Dilaudid [hydromorphone hcl] Other (See Comments) 12/31/2015  . Reglan [metoclopramide] Other (See Comments) 06/04/2014  . Hyoscyamine  09/05/2019    Family History  Problem Relation Age of Onset  . Hypertension Mother   . Alzheimer's disease Mother   . Stroke Mother   . Heart attack Mother   . Hypertension Other   . Breast cancer Sister   . Heart disease Neg Hx   . Colon cancer Neg Hx     Social History   Socioeconomic History  . Marital status: Married    Spouse name: Not on file  . Number of children: Not on file  . Years of education: Not on file  . Highest education level: Not on file  Occupational History  . Occupation: Passenger transport manager at [employer]    Employer:     Comment: disabled    Employer: RETIRED  Tobacco Use  . Smoking status: Former Smoker    Packs/day: 0.25    Years: 15.00    Pack years: 3.75    Types: Cigarettes    Start date: 02/26/1966    Quit date: 07/01/1983    Years since quitting: 36.9  . Smokeless tobacco: Never Used  . Tobacco comment: quit in 1984  Vaping Use  . Vaping Use: Never used  Substance and Sexual Activity  . Alcohol use: No    Alcohol/week: 0.0 standard drinks  . Drug use: No  . Sexual activity:  Yes    Birth control/protection: None, Surgical  Other Topics Concern  . Not on file  Social History Narrative    Sedentary   4 children, "blended family"   Social Determinants of Health   Financial Resource Strain: Not on file  Food Insecurity: Not on file  Transportation Needs: Not on file  Physical Activity: Not on file  Stress: Not on file  Social Connections: Not on file    Subjective: Review of Systems  Constitutional: Negative for chills and fever.  HENT: Negative for congestion and hearing loss.   Eyes: Negative for blurred vision and double vision.  Respiratory: Negative for cough and shortness of breath.   Cardiovascular: Negative for chest pain and palpitations.  Gastrointestinal: Positive for abdominal pain and diarrhea. Negative for blood in stool, constipation, heartburn, melena and vomiting.  Genitourinary: Negative for dysuria and urgency.  Musculoskeletal: Negative for joint pain and myalgias.  Skin: Negative for itching and rash.  Neurological: Negative for dizziness and headaches.  Psychiatric/Behavioral: Negative for depression. The patient is not nervous/anxious.      Objective: BP 108/72   Pulse 99   Temp (!) 96.2 F (35.7 C) (Temporal)   Ht 5\' 6"  (1.676 m)   Wt 229 lb 6.4 oz (104.1 kg)   BMI 37.03 kg/m  Physical Exam Constitutional:      Appearance: Normal appearance.  HENT:     Head: Normocephalic and atraumatic.  Eyes:     Extraocular Movements: Extraocular movements intact.     Conjunctiva/sclera: Conjunctivae normal.  Cardiovascular:     Rate and Rhythm: Normal rate and regular rhythm.  Pulmonary:     Effort: Pulmonary effort is normal.     Breath sounds: Normal breath sounds.  Abdominal:     General: Bowel sounds are normal.     Palpations: Abdomen is soft.  Musculoskeletal:        General: No swelling. Normal range of motion.     Cervical back: Normal range of motion and neck supple.  Skin:    General: Skin is warm and dry.     Coloration: Skin is not jaundiced.  Neurological:     General: No focal deficit present.     Mental Status:  She is alert and oriented to person, place, and time.  Psychiatric:        Mood and Affect: Mood normal.        Behavior: Behavior normal.      Assessment: *Irritable bowel syndrome-diarrhea predominant *Lower abdominal pain, progressively worsening *Weight loss *Food aversion  Plan: In regards to patient's irritable bowel syndrome, this is relatively well controlled on dicyclomine and Lactaid.  We will continue for now.  Previous colonoscopy in 2015 was relatively unremarkable.  Her abdominal pain is concerning as this has progressively worsened over time.  Also notes food aversion as well as weight loss.  I am concerned given her vascular history that she may have chronic mesenteric ischemia.  Will order a mesenteric ultrasound to further evaluate.  Possible she may need CT angiogram in the future.  Patient follow-up in 4 to 6 weeks or sooner if needed.   06/17/2020 4:49 PM   Disclaimer: This note was dictated with voice recognition software. Similar sounding words can inadvertently be transcribed and may not be corrected upon review.

## 2020-06-17 NOTE — Patient Instructions (Signed)
Need for ultrasound to evaluate blood vessels that supply your colon to evaluate for chronic mesenteric ischemia.  Further recommendations to follow.  Continue on dicyclomine.  At [facility] we value your feedback. You may receive a survey about your visit today. Please share your experience as we strive to create trusting relationships with our patients to provide genuine, compassionate, quality care.  We appreciate your understanding and patience as we review any laboratory studies, imaging, and other diagnostic tests that are ordered as we care for you. Our office policy is 5 business days for review of these results, and any emergent or urgent results are addressed in a timely manner for your best interest. If you do not hear from our office in 1 week, please contact us.   We also encourage the use of MyChart, which contains your medical information for your review as well. If you are not enrolled in this feature, an access code is on this after visit summary for your convenience. Thank you for allowing [place] to be involved in your care.  It was great to see you today!  I hope you have a great rest of your winter!!    Hennie Duos. Marletta Lor, D.O. Gastroenterology and Hepatology [facility]

## 2020-06-24 ENCOUNTER — Other Ambulatory Visit: Payer: Self-pay

## 2020-06-24 ENCOUNTER — Ambulatory Visit (HOSPITAL_COMMUNITY)
Admission: RE | Admit: 2020-06-24 | Discharge: 2020-06-24 | Disposition: A | Discharge status: Home / Self Care | Payer: PPO | Source: Ambulatory Visit | Attending: Internal Medicine | Admitting: Internal Medicine

## 2020-06-24 DIAGNOSIS — R109 Unspecified abdominal pain: Secondary | ICD-10-CM | POA: Diagnosis not present

## 2020-06-24 DIAGNOSIS — R1030 Lower abdominal pain, unspecified: Secondary | ICD-10-CM | POA: Diagnosis not present

## 2020-06-26 IMAGING — XA IR KYPHO VERTEBRAL THORACIC AUGMENTATION
1 series · 13 of 14 positions shown · non-contrast
Comparison: MRI of the chest of 12/28/2017.

INDICATION: Severe low back pain secondary to compression fracture at T12.

EXAM:
BALLOON KYPHOPLASTY AT T12

[Series 300: spine · 13 of 14 slices shown]
[im 1/14]
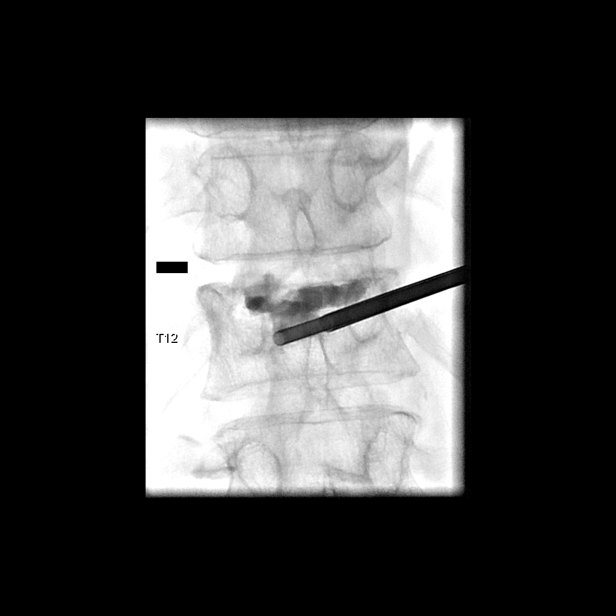
[im 2/14]
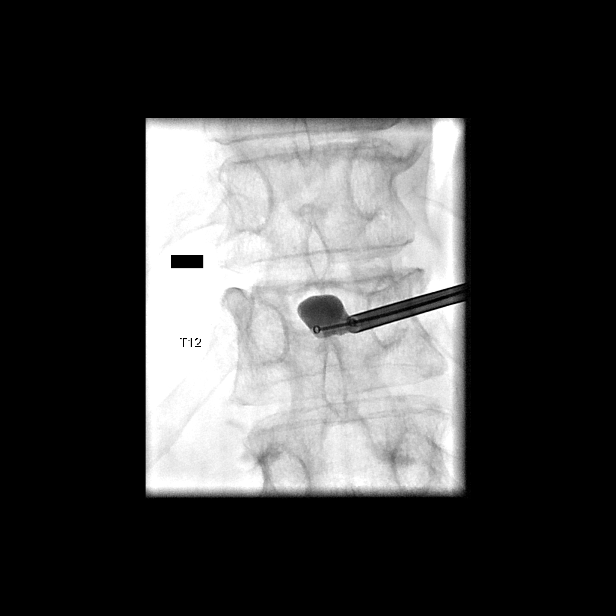
[im 3/14]
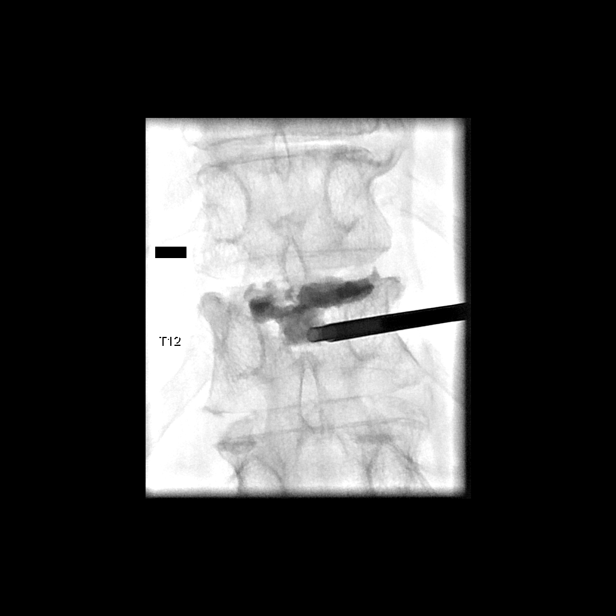
[im 4/14]
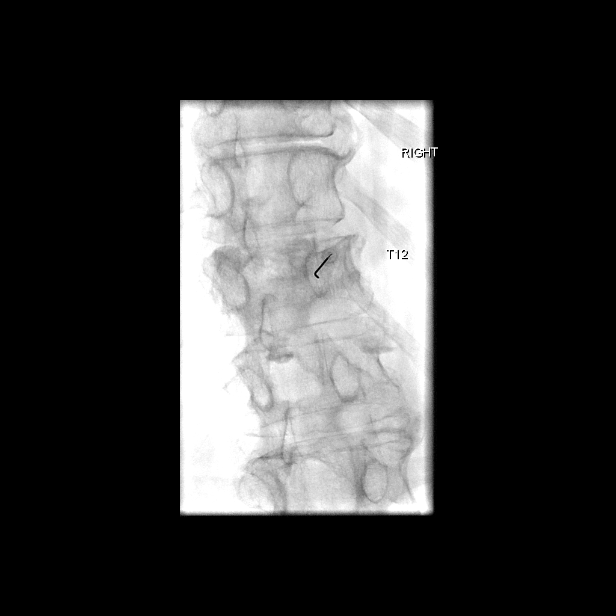
[im 5/14]
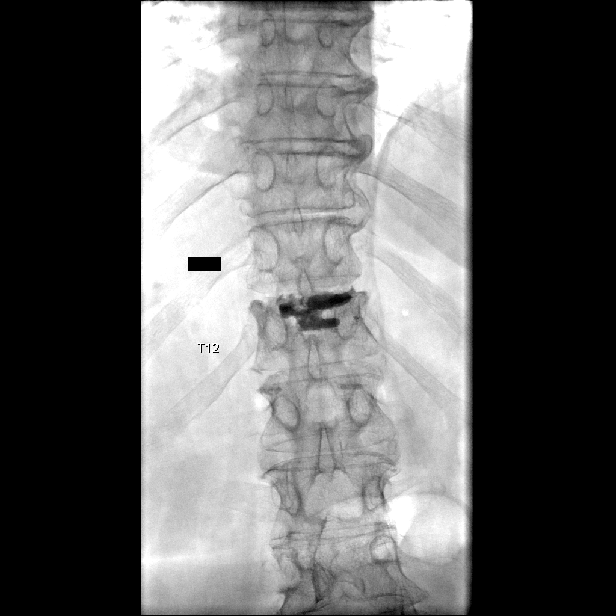
[im 6/14]
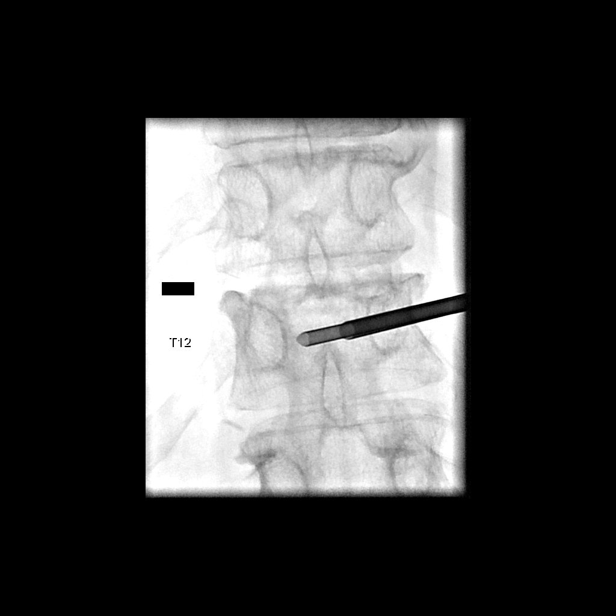
[im 8/14]
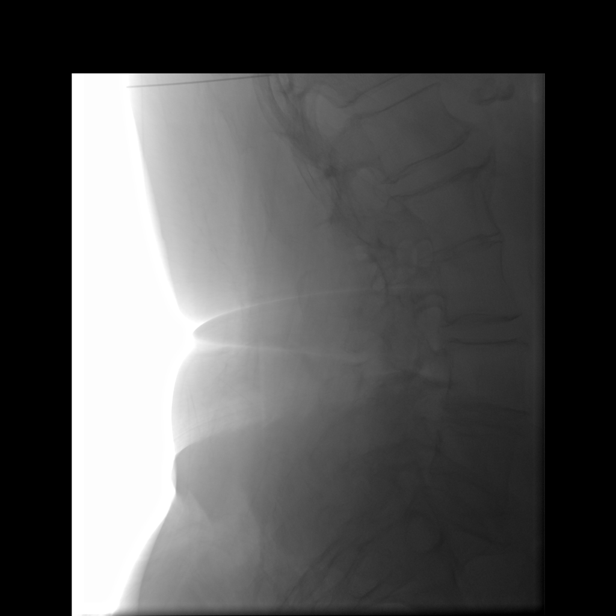
[im 9/14]
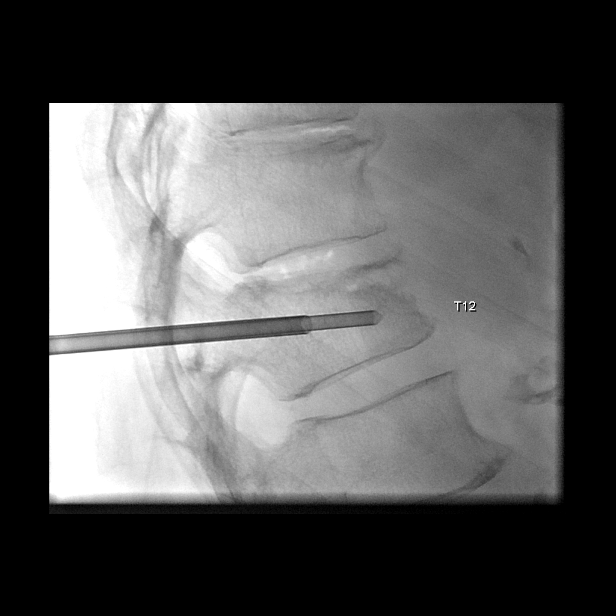
[im 10/14]
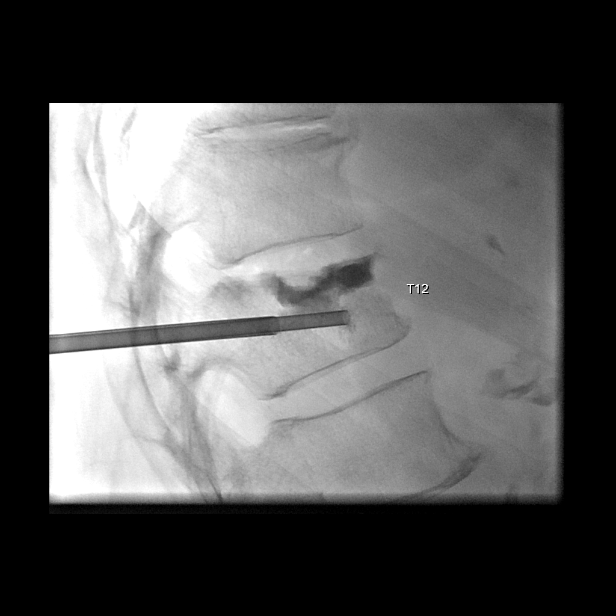
[im 11/14]
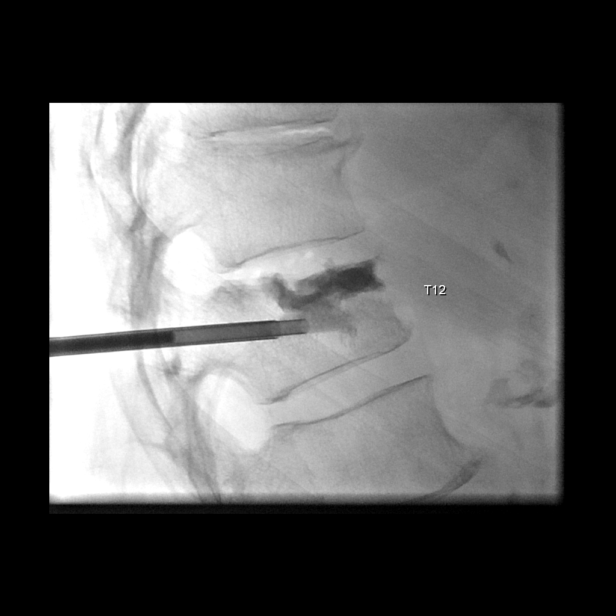
[im 12/14]
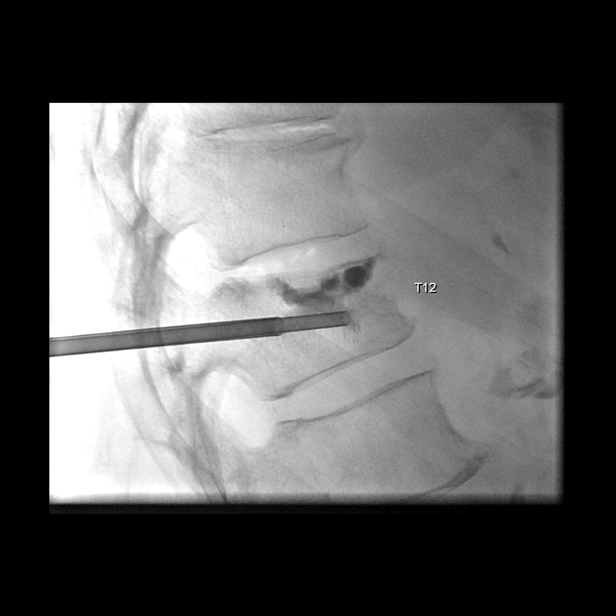
[im 13/14]
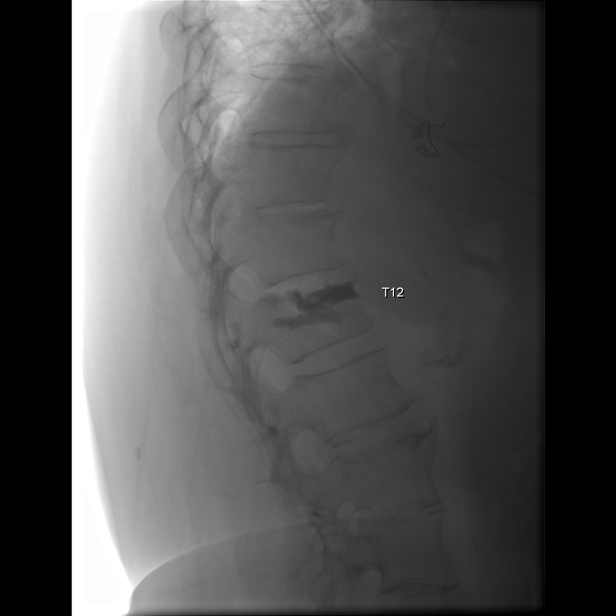
[im 14/14]
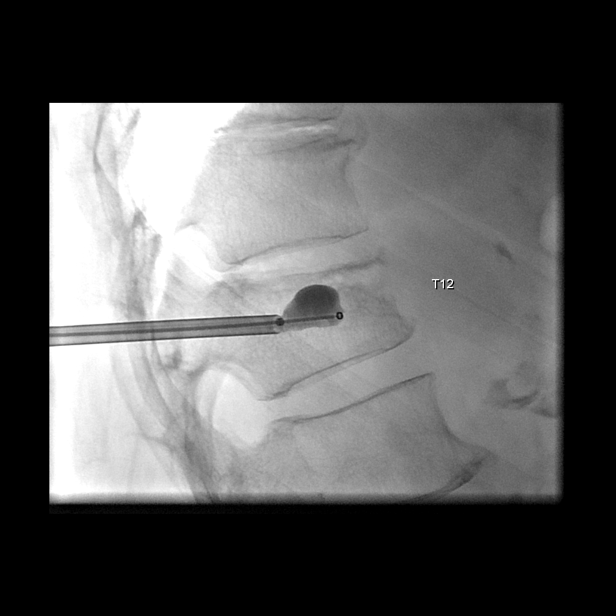

[13 of 14 positions shown; findings below may reference images not displayed]

MEDICATIONS:
As antibiotic prophylaxis, Ancef 2 g IV was ordered pre-procedure
and administered intravenously within 1 hour of incision.

ANESTHESIA/SEDATION:
Moderate (conscious) sedation was employed during this procedure. A
total of Versed 1 mg and Fentanyl 25 mcg was administered
intravenously.

Moderate Sedation Time: 30 minutes. The patient's level of
consciousness and vital signs were monitored continuously by
radiology nursing throughout the procedure under my direct
supervision.

FLUOROSCOPY TIME:  Fluoroscopy Time: 6 minutes 36 seconds (668 mGy)

COMPLICATIONS:
None immediate.

PROCEDURE:
Following a full explanation of the procedure along with the
potential associated complications, an informed witnessed consent
was obtained.

The patient was placed prone on the fluoroscopic table. The skin
overlying the thoracolumbar region was then prepped and draped in
the usual sterile fashion. The right pedicle at T12 was then
infiltrated with 0.25% bupivacaine followed by the advancement of an
11-gauge Jamshidi needle through the right pedicle into the
posterior one-third at T12. This was then exchanged for a Kyphon
advanced osteo introducer system comprised of a working cannula and
a Kyphon osteo drill.

This combination was then advanced over a Kyphon osteo bone pin
until the tip of the Kyphon osteo drill was in the posterior third
at T12.

At this time, the bone pin was removed. In a medial trajectory, the
combination was advanced until the tip of the working cannula was
inside the posterior one-third at T12.

The osteo drill was removed and a core sample sent for pathologic
analysis.

Through the working cannula, a Kyphon bone biopsy device was
advanced to within 5 mm of the anterior aspect of T12. A core sample
from this was also sent for pathologic analysis.

Through the working cannula, a Kyphon inflatable bone tamp 20 x 3
was advanced and positioned with the distal marker 5 mm from the
anterior aspect of T12. Crossing of the midline was seen on the AP
projection. At this time, the balloon was expanded using contrast
via a Kyphon inflation syringe device via microtubing.

Inflations were continued until there was apposition with the
superior endplate.

At this time, methylmethacrylate mixture was reconstituted with
Tobramycin in the Kyphon bone mixing device system. This was then
loaded onto the Kyphon bone fillers.

The balloon was deflated and removed followed by the instillation of
2-1/2 bone filler equivalents of methylmethacrylate mixture at T12
with excellent filling in the AP and lateral projections. No
extravasation was noted in the disk spaces or posteriorly into the
spinal canal. No epidural venous contamination was seen.

The working cannula and the bone filler were then retrieved and
removed. Hemostasis was achieved at the skin entry site. The patient
tolerated the procedure well.
IMPRESSION: 1. Status post fluoroscopic-guided needle placement for deep core
bone biopsy at T12. The results of the biopsy report to be sent to
the referring requesting doctor. The patient's family was also
advised to check with the referring doctor's office in the next 2-3
days regarding the report of the biopsy.
2. Status post vertebral body augmentation using balloon kyphoplasty
at T12 as described without event.

## 2020-07-01 ENCOUNTER — Telehealth: Payer: Self-pay | Admitting: Internal Medicine

## 2020-07-01 NOTE — Telephone Encounter (Signed)
Phoned the pt's daughter back and LM that results have not been sent to me. Once they are I will give them a call.

## 2020-07-01 NOTE — Telephone Encounter (Signed)
Patient daughter called asking about the results from her ultrasound    Please call if they are complete  [phone]

## 2020-07-05 DIAGNOSIS — R269 Unspecified abnormalities of gait and mobility: Secondary | ICD-10-CM | POA: Diagnosis not present

## 2020-07-05 DIAGNOSIS — I5032 Chronic diastolic (congestive) heart failure: Secondary | ICD-10-CM | POA: Diagnosis not present

## 2020-07-23 ENCOUNTER — Other Ambulatory Visit: Payer: Self-pay

## 2020-07-23 DIAGNOSIS — D509 Iron deficiency anemia, unspecified: Secondary | ICD-10-CM

## 2020-07-23 DIAGNOSIS — D649 Anemia, unspecified: Secondary | ICD-10-CM

## 2020-07-23 DIAGNOSIS — D500 Iron deficiency anemia secondary to blood loss (chronic): Secondary | ICD-10-CM

## 2020-07-23 NOTE — Progress Notes (Signed)
Pt lab forms mailed

## 2020-07-29 ENCOUNTER — Other Ambulatory Visit: Payer: Self-pay | Admitting: Cardiology

## 2020-07-30 ENCOUNTER — Ambulatory Visit: Payer: PPO | Admitting: Internal Medicine

## 2020-08-05 DIAGNOSIS — R269 Unspecified abnormalities of gait and mobility: Secondary | ICD-10-CM | POA: Diagnosis not present

## 2020-08-05 DIAGNOSIS — I5032 Chronic diastolic (congestive) heart failure: Secondary | ICD-10-CM | POA: Diagnosis not present

## 2020-08-13 DIAGNOSIS — I219 Acute myocardial infarction, unspecified: Secondary | ICD-10-CM

## 2020-08-13 HISTORY — DX: Acute myocardial infarction, unspecified: I21.9

## 2020-08-14 DIAGNOSIS — N390 Urinary tract infection, site not specified: Secondary | ICD-10-CM | POA: Diagnosis not present

## 2020-08-15 DIAGNOSIS — I444 Left anterior fascicular block: Secondary | ICD-10-CM | POA: Diagnosis not present

## 2020-08-15 DIAGNOSIS — J9811 Atelectasis: Secondary | ICD-10-CM | POA: Diagnosis not present

## 2020-08-15 DIAGNOSIS — Z794 Long term (current) use of insulin: Secondary | ICD-10-CM | POA: Diagnosis not present

## 2020-08-15 DIAGNOSIS — R9431 Abnormal electrocardiogram [ECG] [EKG]: Secondary | ICD-10-CM | POA: Diagnosis not present

## 2020-08-15 DIAGNOSIS — E039 Hypothyroidism, unspecified: Secondary | ICD-10-CM | POA: Diagnosis not present

## 2020-08-15 DIAGNOSIS — R531 Weakness: Secondary | ICD-10-CM | POA: Diagnosis not present

## 2020-08-15 DIAGNOSIS — R0602 Shortness of breath: Secondary | ICD-10-CM | POA: Diagnosis not present

## 2020-08-15 DIAGNOSIS — R059 Cough, unspecified: Secondary | ICD-10-CM | POA: Diagnosis not present

## 2020-08-15 DIAGNOSIS — R0600 Dyspnea, unspecified: Secondary | ICD-10-CM | POA: Diagnosis not present

## 2020-08-15 DIAGNOSIS — N390 Urinary tract infection, site not specified: Secondary | ICD-10-CM | POA: Diagnosis not present

## 2020-08-15 DIAGNOSIS — Z20822 Contact with and (suspected) exposure to covid-19: Secondary | ICD-10-CM | POA: Diagnosis not present

## 2020-08-15 DIAGNOSIS — E1165 Type 2 diabetes mellitus with hyperglycemia: Secondary | ICD-10-CM | POA: Diagnosis not present

## 2020-08-15 DIAGNOSIS — I10 Essential (primary) hypertension: Secondary | ICD-10-CM | POA: Diagnosis not present

## 2020-08-15 DIAGNOSIS — Z79899 Other long term (current) drug therapy: Secondary | ICD-10-CM | POA: Diagnosis not present

## 2020-08-15 DIAGNOSIS — J9600 Acute respiratory failure, unspecified whether with hypoxia or hypercapnia: Secondary | ICD-10-CM | POA: Diagnosis not present

## 2020-08-15 DIAGNOSIS — I700 Atherosclerosis of aorta: Secondary | ICD-10-CM | POA: Diagnosis not present

## 2020-08-15 DIAGNOSIS — I214 Non-ST elevation (NSTEMI) myocardial infarction: Secondary | ICD-10-CM | POA: Diagnosis not present

## 2020-08-15 DIAGNOSIS — I509 Heart failure, unspecified: Secondary | ICD-10-CM | POA: Diagnosis not present

## 2020-08-15 DIAGNOSIS — I4891 Unspecified atrial fibrillation: Secondary | ICD-10-CM | POA: Diagnosis not present

## 2020-08-15 DIAGNOSIS — R410 Disorientation, unspecified: Secondary | ICD-10-CM | POA: Diagnosis not present

## 2020-08-15 DIAGNOSIS — I517 Cardiomegaly: Secondary | ICD-10-CM | POA: Diagnosis not present

## 2020-08-15 DIAGNOSIS — Z9981 Dependence on supplemental oxygen: Secondary | ICD-10-CM | POA: Diagnosis not present

## 2020-08-15 DIAGNOSIS — J9690 Respiratory failure, unspecified, unspecified whether with hypoxia or hypercapnia: Secondary | ICD-10-CM | POA: Diagnosis not present

## 2020-08-15 DIAGNOSIS — Z7901 Long term (current) use of anticoagulants: Secondary | ICD-10-CM | POA: Diagnosis not present

## 2020-08-15 DIAGNOSIS — J90 Pleural effusion, not elsewhere classified: Secondary | ICD-10-CM | POA: Diagnosis not present

## 2020-08-15 DIAGNOSIS — I5032 Chronic diastolic (congestive) heart failure: Secondary | ICD-10-CM | POA: Diagnosis not present

## 2020-08-15 DIAGNOSIS — R4182 Altered mental status, unspecified: Secondary | ICD-10-CM | POA: Diagnosis not present

## 2020-08-15 DIAGNOSIS — E119 Type 2 diabetes mellitus without complications: Secondary | ICD-10-CM | POA: Diagnosis not present

## 2020-08-17 DIAGNOSIS — Z7982 Long term (current) use of aspirin: Secondary | ICD-10-CM | POA: Diagnosis not present

## 2020-08-17 DIAGNOSIS — M6250 Muscle wasting and atrophy, not elsewhere classified, unspecified site: Secondary | ICD-10-CM | POA: Diagnosis not present

## 2020-08-17 DIAGNOSIS — Z7984 Long term (current) use of oral hypoglycemic drugs: Secondary | ICD-10-CM | POA: Diagnosis not present

## 2020-08-17 DIAGNOSIS — I272 Pulmonary hypertension, unspecified: Secondary | ICD-10-CM | POA: Diagnosis not present

## 2020-08-17 DIAGNOSIS — J449 Chronic obstructive pulmonary disease, unspecified: Secondary | ICD-10-CM | POA: Diagnosis not present

## 2020-08-17 DIAGNOSIS — E1165 Type 2 diabetes mellitus with hyperglycemia: Secondary | ICD-10-CM | POA: Diagnosis not present

## 2020-08-17 DIAGNOSIS — R0902 Hypoxemia: Secondary | ICD-10-CM | POA: Diagnosis not present

## 2020-08-17 DIAGNOSIS — N189 Chronic kidney disease, unspecified: Secondary | ICD-10-CM | POA: Diagnosis not present

## 2020-08-17 DIAGNOSIS — I4891 Unspecified atrial fibrillation: Secondary | ICD-10-CM | POA: Diagnosis not present

## 2020-08-17 DIAGNOSIS — E039 Hypothyroidism, unspecified: Secondary | ICD-10-CM | POA: Diagnosis not present

## 2020-08-17 DIAGNOSIS — I340 Nonrheumatic mitral (valve) insufficiency: Secondary | ICD-10-CM | POA: Diagnosis not present

## 2020-08-17 DIAGNOSIS — I5023 Acute on chronic systolic (congestive) heart failure: Secondary | ICD-10-CM | POA: Diagnosis not present

## 2020-08-17 DIAGNOSIS — G40909 Epilepsy, unspecified, not intractable, without status epilepticus: Secondary | ICD-10-CM | POA: Diagnosis not present

## 2020-08-17 DIAGNOSIS — D649 Anemia, unspecified: Secondary | ICD-10-CM | POA: Diagnosis not present

## 2020-08-17 DIAGNOSIS — Z794 Long term (current) use of insulin: Secondary | ICD-10-CM | POA: Diagnosis not present

## 2020-08-17 DIAGNOSIS — I2510 Atherosclerotic heart disease of native coronary artery without angina pectoris: Secondary | ICD-10-CM | POA: Diagnosis not present

## 2020-08-17 DIAGNOSIS — R531 Weakness: Secondary | ICD-10-CM | POA: Diagnosis not present

## 2020-08-17 DIAGNOSIS — I214 Non-ST elevation (NSTEMI) myocardial infarction: Secondary | ICD-10-CM | POA: Insufficient documentation

## 2020-08-17 DIAGNOSIS — I5030 Unspecified diastolic (congestive) heart failure: Secondary | ICD-10-CM | POA: Diagnosis not present

## 2020-08-17 DIAGNOSIS — F419 Anxiety disorder, unspecified: Secondary | ICD-10-CM | POA: Diagnosis not present

## 2020-08-17 DIAGNOSIS — Z743 Need for continuous supervision: Secondary | ICD-10-CM | POA: Diagnosis not present

## 2020-08-17 DIAGNOSIS — I517 Cardiomegaly: Secondary | ICD-10-CM | POA: Diagnosis not present

## 2020-08-17 DIAGNOSIS — R0602 Shortness of breath: Secondary | ICD-10-CM | POA: Diagnosis not present

## 2020-08-17 DIAGNOSIS — E1122 Type 2 diabetes mellitus with diabetic chronic kidney disease: Secondary | ICD-10-CM | POA: Diagnosis not present

## 2020-08-17 DIAGNOSIS — Z9989 Dependence on other enabling machines and devices: Secondary | ICD-10-CM | POA: Diagnosis not present

## 2020-08-17 DIAGNOSIS — M797 Fibromyalgia: Secondary | ICD-10-CM | POA: Diagnosis not present

## 2020-08-17 DIAGNOSIS — R918 Other nonspecific abnormal finding of lung field: Secondary | ICD-10-CM | POA: Diagnosis not present

## 2020-08-17 DIAGNOSIS — E785 Hyperlipidemia, unspecified: Secondary | ICD-10-CM | POA: Diagnosis not present

## 2020-08-17 DIAGNOSIS — G4733 Obstructive sleep apnea (adult) (pediatric): Secondary | ICD-10-CM | POA: Diagnosis not present

## 2020-08-17 DIAGNOSIS — D696 Thrombocytopenia, unspecified: Secondary | ICD-10-CM | POA: Diagnosis not present

## 2020-08-17 DIAGNOSIS — I130 Hypertensive heart and chronic kidney disease with heart failure and stage 1 through stage 4 chronic kidney disease, or unspecified chronic kidney disease: Secondary | ICD-10-CM | POA: Diagnosis not present

## 2020-08-17 DIAGNOSIS — R0600 Dyspnea, unspecified: Secondary | ICD-10-CM | POA: Diagnosis not present

## 2020-08-17 DIAGNOSIS — I5021 Acute systolic (congestive) heart failure: Secondary | ICD-10-CM | POA: Diagnosis not present

## 2020-08-17 DIAGNOSIS — J9610 Chronic respiratory failure, unspecified whether with hypoxia or hypercapnia: Secondary | ICD-10-CM | POA: Diagnosis not present

## 2020-08-17 DIAGNOSIS — I081 Rheumatic disorders of both mitral and tricuspid valves: Secondary | ICD-10-CM | POA: Diagnosis not present

## 2020-08-17 DIAGNOSIS — I252 Old myocardial infarction: Secondary | ICD-10-CM | POA: Diagnosis not present

## 2020-08-17 DIAGNOSIS — I5020 Unspecified systolic (congestive) heart failure: Secondary | ICD-10-CM | POA: Diagnosis not present

## 2020-08-17 DIAGNOSIS — J90 Pleural effusion, not elsewhere classified: Secondary | ICD-10-CM | POA: Diagnosis not present

## 2020-08-17 DIAGNOSIS — Z7901 Long term (current) use of anticoagulants: Secondary | ICD-10-CM | POA: Diagnosis not present

## 2020-08-17 DIAGNOSIS — I21A1 Myocardial infarction type 2: Secondary | ICD-10-CM | POA: Diagnosis not present

## 2020-08-18 ENCOUNTER — Telehealth: Payer: Self-pay | Admitting: *Deleted

## 2020-08-18 NOTE — Telephone Encounter (Signed)
[name], you are scheduled for a virtual visit with your provider today.  Just as we do with appointments in the office, we must obtain your consent to participate.  Your consent will be active for this visit and any virtual visit you may have with one of our providers in the next 365 days.  If you have a MyChart account, I can also send a copy of this consent to you electronically.  All virtual visits are billed to your insurance company just like a traditional visit in the office.  As this is a virtual visit, video technology does not allow for your provider to perform a traditional examination.  This may limit your provider's ability to fully assess your condition.  If your provider identifies any concerns that need to be evaluated in person or the need to arrange testing such as labs, EKG, etc, we will make arrangements to do so.  Although advances in technology are sophisticated, we cannot ensure that it will always work on either your end or our end.  If the connection with a video visit is poor, we may have to switch to a telephone visit.  With either a video or telephone visit, we are not always able to ensure that we have a secure connection.   I need to obtain your verbal consent now.   Are you willing to proceed with your visit today?

## 2020-08-18 NOTE — Telephone Encounter (Signed)
Pt consented to a virtual visit for 08/19/2020.

## 2020-08-19 ENCOUNTER — Other Ambulatory Visit: Payer: Self-pay

## 2020-08-19 ENCOUNTER — Telehealth: Payer: PPO | Admitting: Internal Medicine

## 2020-08-19 DIAGNOSIS — G4733 Obstructive sleep apnea (adult) (pediatric): Secondary | ICD-10-CM | POA: Diagnosis not present

## 2020-08-23 ENCOUNTER — Emergency Department (HOSPITAL_COMMUNITY): Payer: PPO

## 2020-08-23 ENCOUNTER — Inpatient Hospital Stay (HOSPITAL_COMMUNITY)
Admission: EM | Admit: 2020-08-23 | Discharge: 2020-09-23 | DRG: 870 | Disposition: A | Discharge status: Home Health | Payer: PPO | Attending: Internal Medicine | Admitting: Internal Medicine

## 2020-08-23 DIAGNOSIS — E1165 Type 2 diabetes mellitus with hyperglycemia: Secondary | ICD-10-CM | POA: Diagnosis present

## 2020-08-23 DIAGNOSIS — G9389 Other specified disorders of brain: Secondary | ICD-10-CM | POA: Diagnosis not present

## 2020-08-23 DIAGNOSIS — I272 Pulmonary hypertension, unspecified: Secondary | ICD-10-CM | POA: Diagnosis present

## 2020-08-23 DIAGNOSIS — T17500A Unspecified foreign body in bronchus causing asphyxiation, initial encounter: Secondary | ICD-10-CM

## 2020-08-23 DIAGNOSIS — K3184 Gastroparesis: Secondary | ICD-10-CM | POA: Diagnosis present

## 2020-08-23 DIAGNOSIS — R4189 Other symptoms and signs involving cognitive functions and awareness: Secondary | ICD-10-CM

## 2020-08-23 DIAGNOSIS — G9341 Metabolic encephalopathy: Secondary | ICD-10-CM | POA: Diagnosis present

## 2020-08-23 DIAGNOSIS — R6521 Severe sepsis with septic shock: Secondary | ICD-10-CM | POA: Diagnosis present

## 2020-08-23 DIAGNOSIS — J181 Lobar pneumonia, unspecified organism: Secondary | ICD-10-CM | POA: Diagnosis not present

## 2020-08-23 DIAGNOSIS — R0602 Shortness of breath: Secondary | ICD-10-CM | POA: Diagnosis not present

## 2020-08-23 DIAGNOSIS — R1310 Dysphagia, unspecified: Secondary | ICD-10-CM | POA: Diagnosis not present

## 2020-08-23 DIAGNOSIS — J9690 Respiratory failure, unspecified, unspecified whether with hypoxia or hypercapnia: Secondary | ICD-10-CM | POA: Diagnosis not present

## 2020-08-23 DIAGNOSIS — E785 Hyperlipidemia, unspecified: Secondary | ICD-10-CM | POA: Diagnosis present

## 2020-08-23 DIAGNOSIS — J9601 Acute respiratory failure with hypoxia: Secondary | ICD-10-CM | POA: Diagnosis not present

## 2020-08-23 DIAGNOSIS — J9621 Acute and chronic respiratory failure with hypoxia: Secondary | ICD-10-CM | POA: Diagnosis not present

## 2020-08-23 DIAGNOSIS — I639 Cerebral infarction, unspecified: Secondary | ICD-10-CM | POA: Diagnosis not present

## 2020-08-23 DIAGNOSIS — I517 Cardiomegaly: Secondary | ICD-10-CM | POA: Diagnosis not present

## 2020-08-23 DIAGNOSIS — Z20822 Contact with and (suspected) exposure to covid-19: Secondary | ICD-10-CM | POA: Diagnosis present

## 2020-08-23 DIAGNOSIS — R918 Other nonspecific abnormal finding of lung field: Secondary | ICD-10-CM | POA: Diagnosis not present

## 2020-08-23 DIAGNOSIS — K219 Gastro-esophageal reflux disease without esophagitis: Secondary | ICD-10-CM | POA: Diagnosis present

## 2020-08-23 DIAGNOSIS — I63232 Cerebral infarction due to unspecified occlusion or stenosis of left carotid arteries: Secondary | ICD-10-CM | POA: Diagnosis not present

## 2020-08-23 DIAGNOSIS — F419 Anxiety disorder, unspecified: Secondary | ICD-10-CM | POA: Diagnosis present

## 2020-08-23 DIAGNOSIS — J690 Pneumonitis due to inhalation of food and vomit: Secondary | ICD-10-CM | POA: Diagnosis present

## 2020-08-23 DIAGNOSIS — G40909 Epilepsy, unspecified, not intractable, without status epilepticus: Secondary | ICD-10-CM | POA: Diagnosis present

## 2020-08-23 DIAGNOSIS — E039 Hypothyroidism, unspecified: Secondary | ICD-10-CM | POA: Diagnosis present

## 2020-08-23 DIAGNOSIS — R001 Bradycardia, unspecified: Secondary | ICD-10-CM | POA: Diagnosis not present

## 2020-08-23 DIAGNOSIS — E876 Hypokalemia: Secondary | ICD-10-CM | POA: Diagnosis not present

## 2020-08-23 DIAGNOSIS — D6489 Other specified anemias: Secondary | ICD-10-CM | POA: Diagnosis present

## 2020-08-23 DIAGNOSIS — J811 Chronic pulmonary edema: Secondary | ICD-10-CM | POA: Diagnosis not present

## 2020-08-23 DIAGNOSIS — F0390 Unspecified dementia without behavioral disturbance: Secondary | ICD-10-CM | POA: Diagnosis not present

## 2020-08-23 DIAGNOSIS — E1143 Type 2 diabetes mellitus with diabetic autonomic (poly)neuropathy: Secondary | ICD-10-CM | POA: Diagnosis present

## 2020-08-23 DIAGNOSIS — I482 Chronic atrial fibrillation, unspecified: Secondary | ICD-10-CM | POA: Diagnosis present

## 2020-08-23 DIAGNOSIS — G4733 Obstructive sleep apnea (adult) (pediatric): Secondary | ICD-10-CM | POA: Diagnosis present

## 2020-08-23 DIAGNOSIS — J9600 Acute respiratory failure, unspecified whether with hypoxia or hypercapnia: Secondary | ICD-10-CM

## 2020-08-23 DIAGNOSIS — I10 Essential (primary) hypertension: Secondary | ICD-10-CM | POA: Diagnosis present

## 2020-08-23 DIAGNOSIS — Z823 Family history of stroke: Secondary | ICD-10-CM

## 2020-08-23 DIAGNOSIS — M797 Fibromyalgia: Secondary | ICD-10-CM | POA: Diagnosis present

## 2020-08-23 DIAGNOSIS — J158 Pneumonia due to other specified bacteria: Secondary | ICD-10-CM | POA: Diagnosis present

## 2020-08-23 DIAGNOSIS — H748X3 Other specified disorders of middle ear and mastoid, bilateral: Secondary | ICD-10-CM | POA: Diagnosis not present

## 2020-08-23 DIAGNOSIS — J9622 Acute and chronic respiratory failure with hypercapnia: Secondary | ICD-10-CM | POA: Diagnosis present

## 2020-08-23 DIAGNOSIS — Z794 Long term (current) use of insulin: Secondary | ICD-10-CM | POA: Diagnosis not present

## 2020-08-23 DIAGNOSIS — E782 Mixed hyperlipidemia: Secondary | ICD-10-CM | POA: Diagnosis not present

## 2020-08-23 DIAGNOSIS — J9811 Atelectasis: Secondary | ICD-10-CM

## 2020-08-23 DIAGNOSIS — I5022 Chronic systolic (congestive) heart failure: Secondary | ICD-10-CM | POA: Diagnosis not present

## 2020-08-23 DIAGNOSIS — E871 Hypo-osmolality and hyponatremia: Secondary | ICD-10-CM | POA: Diagnosis present

## 2020-08-23 DIAGNOSIS — R0902 Hypoxemia: Secondary | ICD-10-CM | POA: Diagnosis not present

## 2020-08-23 DIAGNOSIS — E1140 Type 2 diabetes mellitus with diabetic neuropathy, unspecified: Secondary | ICD-10-CM | POA: Diagnosis present

## 2020-08-23 DIAGNOSIS — Z888 Allergy status to other drugs, medicaments and biological substances status: Secondary | ICD-10-CM

## 2020-08-23 DIAGNOSIS — I6381 Other cerebral infarction due to occlusion or stenosis of small artery: Secondary | ICD-10-CM | POA: Diagnosis not present

## 2020-08-23 DIAGNOSIS — E119 Type 2 diabetes mellitus without complications: Secondary | ICD-10-CM | POA: Diagnosis not present

## 2020-08-23 DIAGNOSIS — Z8249 Family history of ischemic heart disease and other diseases of the circulatory system: Secondary | ICD-10-CM

## 2020-08-23 DIAGNOSIS — R269 Unspecified abnormalities of gait and mobility: Secondary | ICD-10-CM | POA: Diagnosis not present

## 2020-08-23 DIAGNOSIS — D6859 Other primary thrombophilia: Secondary | ICD-10-CM | POA: Diagnosis present

## 2020-08-23 DIAGNOSIS — I130 Hypertensive heart and chronic kidney disease with heart failure and stage 1 through stage 4 chronic kidney disease, or unspecified chronic kidney disease: Secondary | ICD-10-CM | POA: Diagnosis not present

## 2020-08-23 DIAGNOSIS — I4891 Unspecified atrial fibrillation: Secondary | ICD-10-CM | POA: Diagnosis present

## 2020-08-23 DIAGNOSIS — E870 Hyperosmolality and hypernatremia: Secondary | ICD-10-CM | POA: Diagnosis not present

## 2020-08-23 DIAGNOSIS — Z7984 Long term (current) use of oral hypoglycemic drugs: Secondary | ICD-10-CM

## 2020-08-23 DIAGNOSIS — Z9289 Personal history of other medical treatment: Secondary | ICD-10-CM

## 2020-08-23 DIAGNOSIS — I429 Cardiomyopathy, unspecified: Secondary | ICD-10-CM | POA: Diagnosis present

## 2020-08-23 DIAGNOSIS — D631 Anemia in chronic kidney disease: Secondary | ICD-10-CM | POA: Diagnosis present

## 2020-08-23 DIAGNOSIS — E1122 Type 2 diabetes mellitus with diabetic chronic kidney disease: Secondary | ICD-10-CM | POA: Diagnosis present

## 2020-08-23 DIAGNOSIS — Y95 Nosocomial condition: Secondary | ICD-10-CM | POA: Diagnosis present

## 2020-08-23 DIAGNOSIS — I509 Heart failure, unspecified: Secondary | ICD-10-CM | POA: Diagnosis not present

## 2020-08-23 DIAGNOSIS — R464 Slowness and poor responsiveness: Secondary | ICD-10-CM | POA: Diagnosis not present

## 2020-08-23 DIAGNOSIS — A419 Sepsis, unspecified organism: Secondary | ICD-10-CM | POA: Diagnosis present

## 2020-08-23 DIAGNOSIS — N1831 Chronic kidney disease, stage 3a: Secondary | ICD-10-CM | POA: Diagnosis present

## 2020-08-23 DIAGNOSIS — Z6837 Body mass index (BMI) 37.0-37.9, adult: Secondary | ICD-10-CM

## 2020-08-23 DIAGNOSIS — I6782 Cerebral ischemia: Secondary | ICD-10-CM | POA: Diagnosis not present

## 2020-08-23 DIAGNOSIS — I252 Old myocardial infarction: Secondary | ICD-10-CM

## 2020-08-23 DIAGNOSIS — R569 Unspecified convulsions: Secondary | ICD-10-CM

## 2020-08-23 DIAGNOSIS — E7220 Disorder of urea cycle metabolism, unspecified: Secondary | ICD-10-CM | POA: Diagnosis present

## 2020-08-23 DIAGNOSIS — N170 Acute kidney failure with tubular necrosis: Secondary | ICD-10-CM | POA: Diagnosis present

## 2020-08-23 DIAGNOSIS — B952 Enterococcus as the cause of diseases classified elsewhere: Secondary | ICD-10-CM | POA: Diagnosis present

## 2020-08-23 DIAGNOSIS — M2548 Effusion, other site: Secondary | ICD-10-CM | POA: Diagnosis not present

## 2020-08-23 DIAGNOSIS — Z043 Encounter for examination and observation following other accident: Secondary | ICD-10-CM | POA: Diagnosis not present

## 2020-08-23 DIAGNOSIS — R069 Unspecified abnormalities of breathing: Secondary | ICD-10-CM | POA: Diagnosis not present

## 2020-08-23 DIAGNOSIS — Z87891 Personal history of nicotine dependence: Secondary | ICD-10-CM

## 2020-08-23 DIAGNOSIS — R0600 Dyspnea, unspecified: Secondary | ICD-10-CM

## 2020-08-23 DIAGNOSIS — J189 Pneumonia, unspecified organism: Secondary | ICD-10-CM

## 2020-08-23 DIAGNOSIS — Z885 Allergy status to narcotic agent status: Secondary | ICD-10-CM

## 2020-08-23 DIAGNOSIS — I5043 Acute on chronic combined systolic (congestive) and diastolic (congestive) heart failure: Secondary | ICD-10-CM | POA: Diagnosis not present

## 2020-08-23 DIAGNOSIS — K2270 Barrett's esophagus without dysplasia: Secondary | ICD-10-CM | POA: Diagnosis present

## 2020-08-23 DIAGNOSIS — R4182 Altered mental status, unspecified: Secondary | ICD-10-CM | POA: Diagnosis not present

## 2020-08-23 DIAGNOSIS — J90 Pleural effusion, not elsewhere classified: Secondary | ICD-10-CM | POA: Diagnosis not present

## 2020-08-23 DIAGNOSIS — G319 Degenerative disease of nervous system, unspecified: Secondary | ICD-10-CM | POA: Diagnosis not present

## 2020-08-23 DIAGNOSIS — Z79890 Hormone replacement therapy (postmenopausal): Secondary | ICD-10-CM

## 2020-08-23 DIAGNOSIS — Z7901 Long term (current) use of anticoagulants: Secondary | ICD-10-CM

## 2020-08-23 DIAGNOSIS — E11649 Type 2 diabetes mellitus with hypoglycemia without coma: Secondary | ICD-10-CM | POA: Diagnosis present

## 2020-08-23 DIAGNOSIS — Z79899 Other long term (current) drug therapy: Secondary | ICD-10-CM

## 2020-08-23 DIAGNOSIS — Z4659 Encounter for fitting and adjustment of other gastrointestinal appliance and device: Secondary | ICD-10-CM

## 2020-08-23 DIAGNOSIS — J9691 Respiratory failure, unspecified with hypoxia: Secondary | ICD-10-CM | POA: Diagnosis present

## 2020-08-23 DIAGNOSIS — Z4682 Encounter for fitting and adjustment of non-vascular catheter: Secondary | ICD-10-CM | POA: Diagnosis not present

## 2020-08-23 DIAGNOSIS — Z978 Presence of other specified devices: Secondary | ICD-10-CM

## 2020-08-23 DIAGNOSIS — R404 Transient alteration of awareness: Secondary | ICD-10-CM | POA: Diagnosis not present

## 2020-08-23 DIAGNOSIS — I5032 Chronic diastolic (congestive) heart failure: Secondary | ICD-10-CM | POA: Diagnosis not present

## 2020-08-23 DIAGNOSIS — I6389 Other cerebral infarction: Secondary | ICD-10-CM | POA: Diagnosis not present

## 2020-08-23 DIAGNOSIS — J3489 Other specified disorders of nose and nasal sinuses: Secondary | ICD-10-CM | POA: Diagnosis not present

## 2020-08-23 LAB — COMPREHENSIVE METABOLIC PANEL
ALT: 28 U/L (ref 0–44)
AST: 31 U/L (ref 15–41)
Albumin: 3.4 g/dL — ABNORMAL LOW (ref 3.5–5.0)
Alkaline Phosphatase: 71 U/L (ref 38–126)
Anion gap: 8 (ref 5–15)
BUN: 39 mg/dL — ABNORMAL HIGH (ref 8–23)
CO2: 34 mmol/L — ABNORMAL HIGH (ref 22–32)
Calcium: 8.9 mg/dL (ref 8.9–10.3)
Chloride: 100 mmol/L (ref 98–111)
Creatinine, Ser: 2.15 mg/dL — ABNORMAL HIGH (ref 0.44–1.00)
GFR, Estimated: 24 mL/min — ABNORMAL LOW (ref >60)
Glucose, Bld: 215 mg/dL — ABNORMAL HIGH (ref 70–99)
Potassium: 4.8 mmol/L (ref 3.5–5.1)
Sodium: 142 mmol/L (ref 135–145)
Total Bilirubin: 0.5 mg/dL (ref 0.3–1.2)
Total Protein: 6.6 g/dL (ref 6.5–8.1)

## 2020-08-23 LAB — CBC
HCT: 36.4 % (ref 36.0–46.0)
Hemoglobin: 10.4 g/dL — ABNORMAL LOW (ref 12.0–15.0)
MCH: 32.9 pg (ref 26.0–34.0)
MCHC: 28.6 g/dL — ABNORMAL LOW (ref 30.0–36.0)
MCV: 115.2 fL — ABNORMAL HIGH (ref 80.0–100.0)
Platelets: 234 10*3/uL (ref 150–400)
RBC: 3.16 MIL/uL — ABNORMAL LOW (ref 3.87–5.11)
RDW: 14.6 % (ref 11.5–15.5)
WBC: 11.4 10*3/uL — ABNORMAL HIGH (ref 4.0–10.5)
nRBC: 1 % — ABNORMAL HIGH (ref 0.0–0.2)

## 2020-08-23 LAB — APTT: aPTT: 38 seconds — ABNORMAL HIGH (ref 24–36)

## 2020-08-23 LAB — PROTIME-INR
INR: 1.8 — ABNORMAL HIGH (ref 0.8–1.2)
Prothrombin Time: 20.7 seconds — ABNORMAL HIGH (ref 11.4–15.2)

## 2020-08-23 LAB — ETHANOL: Alcohol, Ethyl (B): <10 mg/dL (ref <10)

## 2020-08-23 LAB — CK: Total CK: 49 U/L (ref 38–234)

## 2020-08-23 LAB — TROPONIN I (HIGH SENSITIVITY): Troponin I (High Sensitivity): 195 ng/L (ref <18)

## 2020-08-23 LAB — ACETAMINOPHEN LEVEL: Acetaminophen (Tylenol), Serum: 17 ug/mL (ref 10–30)

## 2020-08-23 LAB — SALICYLATE LEVEL: Salicylate Lvl: <7 mg/dL — ABNORMAL LOW (ref 7.0–30.0)

## 2020-08-23 MED ORDER — SODIUM BICARBONATE 8.4 % IV SOLN
INTRAVENOUS | Status: AC
  Filled 2020-08-23: qty 50

## 2020-08-23 MED ORDER — SODIUM CHLORIDE 0.9 % IV SOLN
INTRAVENOUS | Status: AC | PRN
  Administered 2020-08-23: 1000 mL via INTRAVENOUS

## 2020-08-23 MED ORDER — NOREPINEPHRINE 4 MG/250ML-% IV SOLN
INTRAVENOUS | Status: AC
  Administered 2020-08-23: 5 ug/min
  Filled 2020-08-23: qty 250

## 2020-08-23 MED ORDER — EPINEPHRINE 1 MG/10ML IJ SOSY
PREFILLED_SYRINGE | INTRAMUSCULAR | Status: AC | PRN
  Administered 2020-08-23: 0.1 mg via INTRAVENOUS

## 2020-08-23 MED ORDER — FLUMAZENIL 0.5 MG/5ML IV SOLN
INTRAVENOUS | Status: AC | PRN
  Administered 2020-08-23: 0.1 mg via INTRAVENOUS

## 2020-08-23 MED ORDER — SODIUM BICARBONATE 8.4 % IV SOLN
INTRAVENOUS | Status: AC | PRN
  Administered 2020-08-23: 50 meq via INTRAVENOUS

## 2020-08-23 NOTE — ED Triage Notes (Signed)
Pt arrives via [facility] for unresponsive. Pt seen yesterday at [facility] and discharged. Heart cath completed but no stents placed. Earlier today pt fell and refused to be transported to ED for eval. Pt intubated on arrival.

## 2020-08-23 NOTE — ED Notes (Addendum)
Date and time results received: 08/23/20 11:50 PM  Test: Troponin  Critical Value: 195  Name of Provider Notified:Dr. Pilar Plate  Orders Received? Or Actions Taken? No new orders received

## 2020-08-24 ENCOUNTER — Inpatient Hospital Stay (HOSPITAL_COMMUNITY): Payer: PPO

## 2020-08-24 ENCOUNTER — Encounter: Payer: Self-pay | Admitting: Internal Medicine

## 2020-08-24 ENCOUNTER — Encounter (HOSPITAL_COMMUNITY): Payer: Self-pay | Admitting: Internal Medicine

## 2020-08-24 DIAGNOSIS — E1143 Type 2 diabetes mellitus with diabetic autonomic (poly)neuropathy: Secondary | ICD-10-CM | POA: Diagnosis present

## 2020-08-24 DIAGNOSIS — J181 Lobar pneumonia, unspecified organism: Secondary | ICD-10-CM

## 2020-08-24 DIAGNOSIS — J9621 Acute and chronic respiratory failure with hypoxia: Secondary | ICD-10-CM | POA: Diagnosis present

## 2020-08-24 DIAGNOSIS — J158 Pneumonia due to other specified bacteria: Secondary | ICD-10-CM | POA: Diagnosis present

## 2020-08-24 DIAGNOSIS — I5032 Chronic diastolic (congestive) heart failure: Secondary | ICD-10-CM | POA: Diagnosis not present

## 2020-08-24 DIAGNOSIS — Z4682 Encounter for fitting and adjustment of non-vascular catheter: Secondary | ICD-10-CM | POA: Diagnosis not present

## 2020-08-24 DIAGNOSIS — R269 Unspecified abnormalities of gait and mobility: Secondary | ICD-10-CM | POA: Diagnosis not present

## 2020-08-24 DIAGNOSIS — A419 Sepsis, unspecified organism: Principal | ICD-10-CM

## 2020-08-24 DIAGNOSIS — Y95 Nosocomial condition: Secondary | ICD-10-CM | POA: Diagnosis present

## 2020-08-24 DIAGNOSIS — R0902 Hypoxemia: Secondary | ICD-10-CM | POA: Diagnosis not present

## 2020-08-24 DIAGNOSIS — E039 Hypothyroidism, unspecified: Secondary | ICD-10-CM | POA: Diagnosis present

## 2020-08-24 DIAGNOSIS — Z043 Encounter for examination and observation following other accident: Secondary | ICD-10-CM | POA: Diagnosis not present

## 2020-08-24 DIAGNOSIS — J9601 Acute respiratory failure with hypoxia: Secondary | ICD-10-CM | POA: Diagnosis not present

## 2020-08-24 DIAGNOSIS — J9811 Atelectasis: Secondary | ICD-10-CM | POA: Diagnosis present

## 2020-08-24 DIAGNOSIS — I4891 Unspecified atrial fibrillation: Secondary | ICD-10-CM | POA: Diagnosis not present

## 2020-08-24 DIAGNOSIS — F0390 Unspecified dementia without behavioral disturbance: Secondary | ICD-10-CM

## 2020-08-24 DIAGNOSIS — E119 Type 2 diabetes mellitus without complications: Secondary | ICD-10-CM | POA: Diagnosis not present

## 2020-08-24 DIAGNOSIS — I6381 Other cerebral infarction due to occlusion or stenosis of small artery: Secondary | ICD-10-CM | POA: Diagnosis not present

## 2020-08-24 DIAGNOSIS — J9690 Respiratory failure, unspecified, unspecified whether with hypoxia or hypercapnia: Secondary | ICD-10-CM | POA: Diagnosis not present

## 2020-08-24 DIAGNOSIS — I517 Cardiomegaly: Secondary | ICD-10-CM | POA: Diagnosis not present

## 2020-08-24 DIAGNOSIS — E782 Mixed hyperlipidemia: Secondary | ICD-10-CM

## 2020-08-24 DIAGNOSIS — I6389 Other cerebral infarction: Secondary | ICD-10-CM | POA: Diagnosis not present

## 2020-08-24 DIAGNOSIS — I482 Chronic atrial fibrillation, unspecified: Secondary | ICD-10-CM | POA: Diagnosis present

## 2020-08-24 DIAGNOSIS — G40909 Epilepsy, unspecified, not intractable, without status epilepticus: Secondary | ICD-10-CM | POA: Diagnosis present

## 2020-08-24 DIAGNOSIS — R4189 Other symptoms and signs involving cognitive functions and awareness: Secondary | ICD-10-CM | POA: Diagnosis present

## 2020-08-24 DIAGNOSIS — I10 Essential (primary) hypertension: Secondary | ICD-10-CM | POA: Diagnosis not present

## 2020-08-24 DIAGNOSIS — I5043 Acute on chronic combined systolic (congestive) and diastolic (congestive) heart failure: Secondary | ICD-10-CM | POA: Diagnosis present

## 2020-08-24 DIAGNOSIS — I639 Cerebral infarction, unspecified: Secondary | ICD-10-CM | POA: Diagnosis not present

## 2020-08-24 DIAGNOSIS — E7220 Disorder of urea cycle metabolism, unspecified: Secondary | ICD-10-CM | POA: Diagnosis present

## 2020-08-24 DIAGNOSIS — E785 Hyperlipidemia, unspecified: Secondary | ICD-10-CM | POA: Diagnosis present

## 2020-08-24 DIAGNOSIS — R4182 Altered mental status, unspecified: Secondary | ICD-10-CM | POA: Diagnosis not present

## 2020-08-24 DIAGNOSIS — E870 Hyperosmolality and hypernatremia: Secondary | ICD-10-CM | POA: Diagnosis not present

## 2020-08-24 DIAGNOSIS — G9341 Metabolic encephalopathy: Secondary | ICD-10-CM | POA: Diagnosis present

## 2020-08-24 DIAGNOSIS — J90 Pleural effusion, not elsewhere classified: Secondary | ICD-10-CM | POA: Diagnosis not present

## 2020-08-24 DIAGNOSIS — I429 Cardiomyopathy, unspecified: Secondary | ICD-10-CM | POA: Diagnosis present

## 2020-08-24 DIAGNOSIS — N170 Acute kidney failure with tubular necrosis: Secondary | ICD-10-CM | POA: Diagnosis present

## 2020-08-24 DIAGNOSIS — R569 Unspecified convulsions: Secondary | ICD-10-CM | POA: Diagnosis not present

## 2020-08-24 DIAGNOSIS — E871 Hypo-osmolality and hyponatremia: Secondary | ICD-10-CM | POA: Diagnosis present

## 2020-08-24 DIAGNOSIS — J9691 Respiratory failure, unspecified with hypoxia: Secondary | ICD-10-CM | POA: Diagnosis present

## 2020-08-24 DIAGNOSIS — I63232 Cerebral infarction due to unspecified occlusion or stenosis of left carotid arteries: Secondary | ICD-10-CM | POA: Diagnosis not present

## 2020-08-24 DIAGNOSIS — Z20822 Contact with and (suspected) exposure to covid-19: Secondary | ICD-10-CM | POA: Diagnosis present

## 2020-08-24 DIAGNOSIS — K3184 Gastroparesis: Secondary | ICD-10-CM | POA: Diagnosis present

## 2020-08-24 DIAGNOSIS — I5022 Chronic systolic (congestive) heart failure: Secondary | ICD-10-CM | POA: Diagnosis not present

## 2020-08-24 DIAGNOSIS — J9622 Acute and chronic respiratory failure with hypercapnia: Secondary | ICD-10-CM | POA: Diagnosis present

## 2020-08-24 DIAGNOSIS — J690 Pneumonitis due to inhalation of food and vomit: Secondary | ICD-10-CM | POA: Diagnosis present

## 2020-08-24 DIAGNOSIS — R6521 Severe sepsis with septic shock: Secondary | ICD-10-CM

## 2020-08-24 DIAGNOSIS — I6782 Cerebral ischemia: Secondary | ICD-10-CM | POA: Diagnosis not present

## 2020-08-24 DIAGNOSIS — Z794 Long term (current) use of insulin: Secondary | ICD-10-CM | POA: Diagnosis not present

## 2020-08-24 DIAGNOSIS — H748X3 Other specified disorders of middle ear and mastoid, bilateral: Secondary | ICD-10-CM | POA: Diagnosis not present

## 2020-08-24 DIAGNOSIS — I130 Hypertensive heart and chronic kidney disease with heart failure and stage 1 through stage 4 chronic kidney disease, or unspecified chronic kidney disease: Secondary | ICD-10-CM | POA: Diagnosis present

## 2020-08-24 DIAGNOSIS — G319 Degenerative disease of nervous system, unspecified: Secondary | ICD-10-CM | POA: Diagnosis not present

## 2020-08-24 DIAGNOSIS — D6859 Other primary thrombophilia: Secondary | ICD-10-CM | POA: Diagnosis present

## 2020-08-24 DIAGNOSIS — J189 Pneumonia, unspecified organism: Secondary | ICD-10-CM | POA: Diagnosis not present

## 2020-08-24 DIAGNOSIS — R918 Other nonspecific abnormal finding of lung field: Secondary | ICD-10-CM | POA: Diagnosis not present

## 2020-08-24 DIAGNOSIS — R1310 Dysphagia, unspecified: Secondary | ICD-10-CM | POA: Diagnosis not present

## 2020-08-24 LAB — GLUCOSE, CAPILLARY
Glucose-Capillary: 207 mg/dL — ABNORMAL HIGH (ref 70–99)
Glucose-Capillary: 223 mg/dL — ABNORMAL HIGH (ref 70–99)
Glucose-Capillary: 220 mg/dL — ABNORMAL HIGH (ref 70–99)

## 2020-08-24 LAB — URINALYSIS, ROUTINE W REFLEX MICROSCOPIC
Bilirubin Urine: NEGATIVE
Glucose, UA: >=500 mg/dL — AB
Hgb urine dipstick: NEGATIVE
Ketones, ur: NEGATIVE mg/dL
Leukocytes,Ua: NEGATIVE
Nitrite: NEGATIVE
Protein, ur: NEGATIVE mg/dL
Specific Gravity, Urine: 1.011 (ref 1.005–1.030)
pH: 5 (ref 5.0–8.0)

## 2020-08-24 LAB — BASIC METABOLIC PANEL
Anion gap: 16 — ABNORMAL HIGH (ref 5–15)
BUN: 39 mg/dL — ABNORMAL HIGH (ref 8–23)
CO2: 30 mmol/L (ref 22–32)
Calcium: 9 mg/dL (ref 8.9–10.3)
Chloride: 98 mmol/L (ref 98–111)
Creatinine, Ser: 2 mg/dL — ABNORMAL HIGH (ref 0.44–1.00)
GFR, Estimated: 26 mL/min — ABNORMAL LOW (ref >60)
Glucose, Bld: 248 mg/dL — ABNORMAL HIGH (ref 70–99)
Potassium: 3.9 mmol/L (ref 3.5–5.1)
Sodium: 144 mmol/L (ref 135–145)

## 2020-08-24 LAB — BLOOD GAS, ARTERIAL
Acid-Base Excess: 6.9 mmol/L — ABNORMAL HIGH (ref 0.0–2.0)
Acid-Base Excess: 7.3 mmol/L — ABNORMAL HIGH (ref 0.0–2.0)
Acid-Base Excess: 7.6 mmol/L — ABNORMAL HIGH (ref 0.0–2.0)
Bicarbonate: 29.4 mmol/L — ABNORMAL HIGH (ref 20.0–28.0)
Bicarbonate: 30.9 mmol/L — ABNORMAL HIGH (ref 20.0–28.0)
Bicarbonate: 31.1 mmol/L — ABNORMAL HIGH (ref 20.0–28.0)
FIO2: 100
FIO2: 100
FIO2: 100
O2 Saturation: 63.3 %
O2 Saturation: 93.2 %
O2 Saturation: 97.9 %
Patient temperature: 36
Patient temperature: 36.4
Patient temperature: 37.2
pCO2 arterial: 43.3 mmHg (ref 32.0–48.0)
pCO2 arterial: 45.9 mmHg (ref 32.0–48.0)
pCO2 arterial: 49.3 mmHg — ABNORMAL HIGH (ref 32.0–48.0)
pH, Arterial: 7.418 (ref 7.350–7.450)
pH, Arterial: 7.453 — ABNORMAL HIGH (ref 7.350–7.450)
pH, Arterial: 7.472 — ABNORMAL HIGH (ref 7.350–7.450)
pO2, Arterial: 32.6 mmHg — CL (ref 83.0–108.0)
pO2, Arterial: 66.7 mmHg — ABNORMAL LOW (ref 83.0–108.0)
pO2, Arterial: 84.2 mmHg (ref 83.0–108.0)

## 2020-08-24 LAB — TYPE AND SCREEN
ABO/RH(D): A POS
Antibody Screen: NEGATIVE

## 2020-08-24 LAB — TROPONIN I (HIGH SENSITIVITY): Troponin I (High Sensitivity): 185 ng/L (ref <18)

## 2020-08-24 LAB — HEMOGLOBIN A1C
Hgb A1c MFr Bld: 7.9 % — ABNORMAL HIGH (ref 4.8–5.6)
Mean Plasma Glucose: 180.03 mg/dL

## 2020-08-24 LAB — RESP PANEL BY RT-PCR (FLU A&B, COVID) ARPGX2
Influenza A by PCR: NEGATIVE
Influenza B by PCR: NEGATIVE
SARS Coronavirus 2 by RT PCR: NEGATIVE

## 2020-08-24 LAB — MRSA PCR SCREENING: MRSA by PCR: NEGATIVE

## 2020-08-24 LAB — LACTIC ACID, PLASMA: Lactic Acid, Venous: 1.2 mmol/L (ref 0.5–1.9)

## 2020-08-24 MED ORDER — POLYETHYLENE GLYCOL 3350 17 G PO PACK
17.0000 g | PACK | Freq: Every day | ORAL | Status: DC
  Administered 2020-08-25 – 2020-08-28 (×4): 17 g
  Filled 2020-08-24 (×5): qty 1

## 2020-08-24 MED ORDER — SODIUM CHLORIDE 0.9 % IV SOLN: INTRAVENOUS | Status: DC

## 2020-08-24 MED ORDER — PROSOURCE TF PO LIQD
45.0000 mL | Freq: Three times a day (TID) | ORAL | Status: DC
  Administered 2020-08-24 – 2020-08-28 (×12): 45 mL
  Filled 2020-08-24 (×12): qty 45

## 2020-08-24 MED ORDER — SODIUM CHLORIDE 0.9 % IV SOLN
2.0000 g | Freq: Two times a day (BID) | INTRAVENOUS | Status: DC
  Administered 2020-08-24 – 2020-08-25 (×4): 2 g via INTRAVENOUS
  Filled 2020-08-24 (×4): qty 2

## 2020-08-24 MED ORDER — PROPOFOL 1000 MG/100ML IV EMUL: 5.0000 ug/kg/min | INTRAVENOUS | Status: DC

## 2020-08-24 MED ORDER — PROPOFOL 1000 MG/100ML IV EMUL
INTRAVENOUS | Status: AC
  Administered 2020-08-24: 15 ug/kg/min via INTRAVENOUS
  Filled 2020-08-24: qty 100

## 2020-08-24 MED ORDER — ALBUTEROL SULFATE (2.5 MG/3ML) 0.083% IN NEBU
INHALATION_SOLUTION | RESPIRATORY_TRACT | Status: AC
  Administered 2020-08-24: 2.5 mg
  Filled 2020-08-24: qty 6

## 2020-08-24 MED ORDER — AMIODARONE HCL 200 MG PO TABS
200.0000 mg | ORAL_TABLET | Freq: Every morning | ORAL | Status: DC
  Administered 2020-08-25: 200 mg
  Filled 2020-08-24: qty 1

## 2020-08-24 MED ORDER — PROSOURCE TF PO LIQD: 45.0000 mL | Freq: Two times a day (BID) | ORAL | Status: DC

## 2020-08-24 MED ORDER — NOREPINEPHRINE 4 MG/250ML-% IV SOLN
INTRAVENOUS | Status: AC
  Administered 2020-08-24: 30 ug/min via INTRAVENOUS
  Filled 2020-08-24: qty 250

## 2020-08-24 MED ORDER — IPRATROPIUM-ALBUTEROL 0.5-2.5 (3) MG/3ML IN SOLN
3.0000 mL | Freq: Four times a day (QID) | RESPIRATORY_TRACT | Status: DC
  Administered 2020-08-24: 3 mL via RESPIRATORY_TRACT
  Filled 2020-08-24: qty 3

## 2020-08-24 MED ORDER — IPRATROPIUM-ALBUTEROL 0.5-2.5 (3) MG/3ML IN SOLN
3.0000 mL | RESPIRATORY_TRACT | Status: DC | PRN
  Administered 2020-09-10 – 2020-09-11 (×4): 3 mL via RESPIRATORY_TRACT
  Filled 2020-08-24 (×4): qty 3

## 2020-08-24 MED ORDER — ACETAMINOPHEN 650 MG RE SUPP: 650.0000 mg | Freq: Four times a day (QID) | RECTAL | Status: DC | PRN

## 2020-08-24 MED ORDER — APIXABAN 5 MG PO TABS
5.0000 mg | ORAL_TABLET | Freq: Two times a day (BID) | ORAL | Status: DC
  Administered 2020-08-24 – 2020-08-28 (×9): 5 mg via NASOGASTRIC
  Filled 2020-08-24 (×9): qty 1

## 2020-08-24 MED ORDER — LORAZEPAM 0.5 MG PO TABS: 0.5000 mg | ORAL_TABLET | Freq: Every evening | ORAL | Status: DC | PRN

## 2020-08-24 MED ORDER — ONDANSETRON HCL 4 MG PO TABS
4.0000 mg | ORAL_TABLET | Freq: Four times a day (QID) | ORAL | Status: DC | PRN
  Administered 2020-09-22: 4 mg via ORAL
  Filled 2020-08-24: qty 1

## 2020-08-24 MED ORDER — LEVETIRACETAM 100 MG/ML PO SOLN
1500.0000 mg | Freq: Two times a day (BID) | ORAL | Status: DC
  Administered 2020-08-24 – 2020-08-28 (×9): 1500 mg via NASOGASTRIC
  Filled 2020-08-24 (×15): qty 15

## 2020-08-24 MED ORDER — DOCUSATE SODIUM 50 MG/5ML PO LIQD
100.0000 mg | Freq: Two times a day (BID) | ORAL | Status: DC
  Administered 2020-08-24 – 2020-08-28 (×8): 100 mg
  Filled 2020-08-24 (×9): qty 10

## 2020-08-24 MED ORDER — LEVOTHYROXINE SODIUM 75 MCG PO TABS
175.0000 ug | ORAL_TABLET | Freq: Every day | ORAL | Status: DC
  Administered 2020-08-24 – 2020-08-28 (×5): 175 ug via NASOGASTRIC
  Filled 2020-08-24 (×5): qty 1

## 2020-08-24 MED ORDER — VANCOMYCIN HCL 2000 MG/400ML IV SOLN: 2000.0000 mg | Freq: Once | INTRAVENOUS | Status: DC

## 2020-08-24 MED ORDER — PROPOFOL 1000 MG/100ML IV EMUL
5.0000 ug/kg/min | INTRAVENOUS | Status: DC
  Administered 2020-08-24: 35 ug/kg/min via INTRAVENOUS
  Administered 2020-08-24: 25 ug/kg/min via INTRAVENOUS
  Filled 2020-08-24: qty 100

## 2020-08-24 MED ORDER — SODIUM CHLORIDE 0.9 % IV BOLUS: 1000.0000 mL | Freq: Once | INTRAVENOUS | Status: DC

## 2020-08-24 MED ORDER — CHLORHEXIDINE GLUCONATE CLOTH 2 % EX PADS
6.0000 | MEDICATED_PAD | Freq: Every day | CUTANEOUS | Status: DC
  Administered 2020-08-24 – 2020-09-01 (×10): 6 via TOPICAL

## 2020-08-24 MED ORDER — PANTOPRAZOLE SODIUM 40 MG PO PACK
40.0000 mg | PACK | ORAL | Status: DC
  Administered 2020-08-24 – 2020-08-27 (×4): 40 mg
  Filled 2020-08-24 (×4): qty 20

## 2020-08-24 MED ORDER — NOREPINEPHRINE 4 MG/250ML-% IV SOLN
0.0000 ug/min | INTRAVENOUS | Status: DC
  Administered 2020-08-24: 25 ug/min via INTRAVENOUS
  Administered 2020-08-24: 20 ug/min via INTRAVENOUS
  Administered 2020-08-24: 30 ug/min via INTRAVENOUS
  Administered 2020-08-24 (×2): 25 ug/min via INTRAVENOUS
  Administered 2020-08-24: 30 ug/min via INTRAVENOUS
  Filled 2020-08-24 (×6): qty 250

## 2020-08-24 MED ORDER — ENOXAPARIN SODIUM 30 MG/0.3ML ~~LOC~~ SOLN: 30.0000 mg | SUBCUTANEOUS | Status: DC

## 2020-08-24 MED ORDER — AMIODARONE HCL 200 MG PO TABS
200.0000 mg | ORAL_TABLET | Freq: Every morning | ORAL | Status: DC
  Administered 2020-08-24: 200 mg via ORAL
  Filled 2020-08-24: qty 1

## 2020-08-24 MED ORDER — FENTANYL CITRATE (PF) 100 MCG/2ML IJ SOLN
25.0000 ug | INTRAMUSCULAR | Status: DC | PRN
Start: 2020-08-24 — End: 2020-08-29
  Administered 2020-08-24 – 2020-08-29 (×8): 25 ug via INTRAVENOUS
  Filled 2020-08-24 (×9): qty 2

## 2020-08-24 MED ORDER — PROPOFOL 1000 MG/100ML IV EMUL
0.0000 ug/kg/min | INTRAVENOUS | Status: DC
  Administered 2020-08-24: 45 ug/kg/min via INTRAVENOUS
  Administered 2020-08-24 (×2): 40 ug/kg/min via INTRAVENOUS
  Administered 2020-08-25: 50 ug/kg/min via INTRAVENOUS
  Administered 2020-08-25 (×6): 45 ug/kg/min via INTRAVENOUS
  Administered 2020-08-26: 40 ug/kg/min via INTRAVENOUS
  Administered 2020-08-26: 35 ug/kg/min via INTRAVENOUS
  Administered 2020-08-26: 45 ug/kg/min via INTRAVENOUS
  Administered 2020-08-26: 35 ug/kg/min via INTRAVENOUS
  Administered 2020-08-26: 50 ug/kg/min via INTRAVENOUS
  Administered 2020-08-27: 25 ug/kg/min via INTRAVENOUS
  Administered 2020-08-27 (×3): 40 ug/kg/min via INTRAVENOUS
  Administered 2020-08-27: 20 ug/kg/min via INTRAVENOUS
  Filled 2020-08-24 (×22): qty 100

## 2020-08-24 MED ORDER — SODIUM CHLORIDE 0.9 % IV SOLN
2.0000 g | Freq: Once | INTRAVENOUS | Status: AC
  Administered 2020-08-24: 2 g via INTRAVENOUS
  Filled 2020-08-24: qty 2

## 2020-08-24 MED ORDER — ACETAMINOPHEN 325 MG PO TABS: 650.0000 mg | ORAL_TABLET | Freq: Four times a day (QID) | ORAL | Status: DC | PRN

## 2020-08-24 MED ORDER — ACETAMINOPHEN 325 MG PO TABS
650.0000 mg | ORAL_TABLET | Freq: Four times a day (QID) | ORAL | Status: DC | PRN
  Administered 2020-08-25 – 2020-09-04 (×10): 650 mg
  Filled 2020-08-24 (×12): qty 2

## 2020-08-24 MED ORDER — ALBUTEROL SULFATE (2.5 MG/3ML) 0.083% IN NEBU
INHALATION_SOLUTION | RESPIRATORY_TRACT | Status: AC
  Administered 2020-08-24: 5 mg
  Filled 2020-08-24: qty 6

## 2020-08-24 MED ORDER — ALBUTEROL SULFATE (2.5 MG/3ML) 0.083% IN NEBU
INHALATION_SOLUTION | RESPIRATORY_TRACT | Status: AC
  Administered 2020-08-24: 2.5 mg
  Filled 2020-08-24: qty 3

## 2020-08-24 MED ORDER — DONEPEZIL HCL 5 MG PO TABS: 5.0000 mg | ORAL_TABLET | Freq: Every day | ORAL | Status: DC

## 2020-08-24 MED ORDER — VITAL HIGH PROTEIN PO LIQD
1000.0000 mL | ORAL | Status: DC
  Administered 2020-08-24 – 2020-08-27 (×4): 1000 mL

## 2020-08-24 MED ORDER — INSULIN ASPART 100 UNIT/ML ~~LOC~~ SOLN
0.0000 [IU] | Freq: Three times a day (TID) | SUBCUTANEOUS | Status: DC
  Administered 2020-08-24 (×2): 3 [IU] via SUBCUTANEOUS
  Administered 2020-08-25: 5 [IU] via SUBCUTANEOUS

## 2020-08-24 MED ORDER — VANCOMYCIN HCL IN DEXTROSE 1-5 GM/200ML-% IV SOLN: 1000.0000 mg | INTRAVENOUS | Status: DC

## 2020-08-24 MED ORDER — ONDANSETRON HCL 4 MG/2ML IJ SOLN
4.0000 mg | Freq: Four times a day (QID) | INTRAMUSCULAR | Status: DC | PRN
  Administered 2020-09-22: 4 mg via INTRAVENOUS
  Filled 2020-08-24: qty 2

## 2020-08-24 MED ORDER — DULOXETINE HCL 30 MG PO CPEP: 30.0000 mg | ORAL_CAPSULE | Freq: Two times a day (BID) | ORAL | Status: DC

## 2020-08-24 MED ORDER — NOREPINEPHRINE 16 MG/250ML-% IV SOLN
0.0000 ug/min | INTRAVENOUS | Status: DC
  Administered 2020-08-25: 30 ug/min via INTRAVENOUS
  Administered 2020-08-25: 24 ug/min via INTRAVENOUS
  Administered 2020-08-25: 15 ug/min via INTRAVENOUS
  Administered 2020-08-27: 2 ug/min via INTRAVENOUS
  Filled 2020-08-24 (×4): qty 250

## 2020-08-24 MED ORDER — IPRATROPIUM BROMIDE 0.02 % IN SOLN
RESPIRATORY_TRACT | Status: AC
  Administered 2020-08-24: 0.5 mg
  Filled 2020-08-24: qty 2.5

## 2020-08-24 MED FILL — Medication: Qty: 1 | Status: AC

## 2020-08-24 NOTE — Progress Notes (Signed)
Pharmacy Antibiotic Note  [name] is a 70 y.o. female admitted on 08/23/2020 with pneumonia.  Pharmacy has been consulted for Vancomycin and cefepime dosing.  Plan: Vancomycin 2000mg  loading dose then 1000mg  IV every 24 hours. Expected AUC 485 Goal trough 15-20 mcg/mL. Cefepime 2gm IV q12h F/U cxs and clinical progress Monitor V/S, labs and levels as indicated  Height: 5\' 6"  (167.6 cm) Weight: 104 kg (229 lb 4.5 oz) IBW/kg (Calculated) : 59.3  Temp (24hrs), Avg:98.1 F (36.7 C), Min:95.9 F (35.5 C), Max:100.04 F (37.8 C)  Recent Labs  Lab 08/23/20 2305 08/23/20 2347 08/24/20 1002  WBC 11.4*  --   --   CREATININE 2.15*  --  2.00*  LATICACIDVEN  --  1.2  --     Estimated Creatinine Clearance: 31.4 mL/min (A) (by C-G formula based on SCr of 2 mg/dL (H)).    Allergies  Allergen Reactions  . Citalopram Hydrobromide Other (See Comments)    Dyskinesia Other reaction(s): Other (See Comments) Dyskinesia  . Codeine Nausea And Vomiting and Other (See Comments)    HALLUCINATIONS Other reaction(s): Unknown Other reaction(s): Other (See Comments) HALLUCINATIONS   . Hydromorphone Hcl Other (See Comments)    Made her pass out Other reaction(s): Other (See Comments) Made her pass out  . Metoclopramide Other (See Comments)    DYSKINESIA Other reaction(s): Other (See Comments) DYSKINESIA  . Hyoscyamine     MADE DIARRHEA WORSE    Antimicrobials this admission: Vancomycin 2/21 >>   Cefepime 2/21 >>   Dose adjustments this admission: prn  Microbiology results: 2/21 MRSA PCR: pending  Thank you for allowing pharmacy to be a part of this patient's care.  3/21, BS Pharm D, 3/21 Clinical Pharmacist Pager [phone] 08/24/2020 10:44 AM

## 2020-08-24 NOTE — Consult Note (Signed)
NAME:  [name], MRN:  [id number], DOB:  [birth year], LOS: 0 ADMISSION DATE:  08/23/2020, CONSULTATION DATE:  08/24/2020 REFERRING MD:  Dr. Pilar Plate, ER, CHIEF COMPLAINT:  Short of breath   Brief History:  70 yo female brought to [facility] ER unresponsive after being d/c from [facility] on 08/22/20 for acute coronary syndrome.  Reported she fell at home.  Intubated in ER.  Found to have multilobar pneumonia and started on antibiotics and pressors.  Hx from chart and medical team.  Past Medical History:  Anemia, Barrett's esophagus, Systolic CHF, DM type 2, A fib, HLD, HTN, Seizures, Hypothyroidism  Significant Hospital Events:  2/20 Admit  Consults:    Procedures:  ETT 2/20 >> Rt IJ CVL 2/21 >>   Significant Diagnostic Tests:  CT head 2/21 >> mild atrophy, chronic microvascular disease CT chest 2/21 >> b/l ASD, small b/l effusions  Micro Data:  COVID 2/20 >> negative Flu PCR 2/20 >> negative MRSA PCR 2/20 >> negative  Antimicrobials:  Vancomycin 2/20 >>  Cefepime 2/20 >>   Interim History / Subjective:    Objective   Blood pressure 119/86, pulse 76, temperature 100 F (37.8 C), temperature source Bladder, resp. rate (!) 28, height 5\' 6"  (1.676 m), weight 104 kg, SpO2 95 %.    Vent Mode: PRVC FiO2 (%):  [80 %-100 %] 90 % Set Rate:  [24 bmp-28 bmp] 28 bmp Vt Set:  [480 mL-550 mL] 480 mL PEEP:  [5 cmH20-10 cmH20] 10 cmH20 Plateau Pressure:  [25 cmH20-27 cmH20] 25 cmH20   Intake/Output Summary (Last 24 hours) at 08/24/2020 1405 Last data filed at 08/24/2020 0932 Gross per 24 hour  Intake 2302.86 ml  Output 450 ml  Net 1852.86 ml   Filed Weights   08/24/20 0139 08/24/20 0156  Weight: 104 kg 104 kg    Examination:  General - sedated Eyes - pupils reactive ENT - ETT in place Cardiac - regular rate/rhythm, no murmur Chest - b/l rhonchi Abdomen - soft, non tender, + bowel sounds Extremities - 1+ edema Skin - no rashes Neuro - RASS -3  Resolved Hospital Problem list      Assessment & Plan:   Acute hypoxic respiratory failure from bacterial HCAP. - adjust PEEP/FiO2 to keep SpO2 90 to 95% - f/u CXR - decrease RR to 24 - day 2 of ABx; MRSA pcr negative >> will d/c vancomycin  Septic shock from pneumonia. - pressors to keep MAP > 65  Acute metabolic encephalopathy from sepsis and hypoxia. Hx of seizures, neuropathy. - RASS goal -1 to -2 - continue keppra - hold outpt aricept, cymbalta, neurontin, ativan  AKI from ATN 2nd to sepsis. CKD 3a. - baseline creatinine 1.40 from 04/08/20 - f/u BMET, monitor urine outpt  Chronic a fib, chronic diastolic CHF, HLD. - continue eliquis, amiodarone - hold outpt torsemide  DM type 2 poorly controlled with hyperglycemia. Hx of hypothyroidism. - SSI - continue levothyroxine - hold outpt glucotrol, metformin  Anemia of chronic disease and critical illness. - f/u CBC - transfuse for Hb < 7 or significant bleeding  Best practice (evaluated daily)  Diet: tube feeds DVT prophylaxis: eliquis GI prophylaxis: protonix Mobility: bed rest Disposition: ICU Code Status: full code  Updated pt's daughter at bedside  Labs    CMP Latest Ref Rng & Units 08/24/2020 08/23/2020 04/08/2020  Glucose 70 - 99 mg/dL 06/08/2020) 939(Q) 300(P)  BUN 8 - 23 mg/dL 233(A) 07(M) 22(Q)  Creatinine 0.44 - 1.00 mg/dL 33(H) 5.45(G) 2.56(L)  Sodium 135 - 145 mmol/L 144 142 140  Potassium 3.5 - 5.1 mmol/L 3.9 4.8 4.3  Chloride 98 - 111 mmol/L 98 100 97(L)  CO2 22 - 32 mmol/L 30 34(H) 38(H)  Calcium 8.9 - 10.3 mg/dL 9.0 8.9 9.4  Total Protein 6.5 - 8.1 g/dL - 6.6 -  Total Bilirubin 0.3 - 1.2 mg/dL - 0.5 -  Alkaline Phos 38 - 126 U/L - 71 -  AST 15 - 41 U/L - 31 -  ALT 0 - 44 U/L - 28 -    CBC Latest Ref Rng & Units 08/23/2020 04/08/2020 01/08/2020  WBC 4.0 - 10.5 K/uL 11.4(H) 8.2 5.8  Hemoglobin 12.0 - 15.0 g/dL 10.4(L) 12.3 10.7(L)  Hematocrit 36.0 - 46.0 % 36.4 37.3 33.8(L)  Platelets 150 - 400 K/uL 234 193 194    ABG     Component Value Date/Time   PHART 7.472 (H) 08/24/2020 0413   PCO2ART 43.3 08/24/2020 0413   PO2ART 66.7 (L) 08/24/2020 0413   HCO3 30.9 (H) 08/24/2020 0413   TCO2 27 01/26/2015 1132   ACIDBASEDEF 1.0 01/26/2015 1132   O2SAT 93.2 08/24/2020 0413    CBG (last 3)  Recent Labs    08/24/20 1134  GLUCAP 207*     Review of Systems:   Unable to obtain.  Past Medical History:  She,  has a past medical history of Abnormal pulmonary function test, Anemia, Anxiety, Arthritis, Barrett's esophagus, Chest pain, Chronic anticoagulation, Chronic combined systolic and diastolic CHF (congestive heart failure) (HCC), Chronic LBP, Diabetes mellitus, type 2 (HCC), Dysrhythmia, Gastroparesis, GERD (gastroesophageal reflux disease), Hiatal hernia, Hyperlipidemia, Hypertension, Hypothyroid, IBS (irritable bowel syndrome), Obesity, OSA on CPAP, Paroxysmal atrial fibrillation (HCC), Pulmonary hypertension (HCC) (01/2015), Seizures (HCC), and Syncope.   Surgical History:   Past Surgical History:  Procedure Laterality Date  . BACK SURGERY  1996  . BIOPSY N/A 11/08/2013   Procedure: BIOPSY  / Tissue sampling / ulcers present in small intestine;  Surgeon: West Bali, MD;  Location: [facility] ENDO [facility];  Service: Endoscopy;  Laterality: N/A;  . CARDIAC CATHETERIZATION  2002  . CARDIAC CATHETERIZATION N/A 01/26/2015   Procedure: Right Heart Cath;  Surgeon: Laurey Morale, MD;  Location: [facility] INVASIVE CV LAB;  Service: Cardiovascular;  Laterality: N/A;  . CARDIOVASCULAR STRESS TEST  2008   Stress nuclear study  . CARDIOVERSION N/A 03/06/2015   Procedure: CARDIOVERSION;  Surgeon: Laurey Morale, MD;  Location: [facility] ENDOSCOPY;  Service: Cardiovascular;  Laterality: N/A;  . CARPAL TUNNEL RELEASE  1994  . COLONOSCOPY  11/2011   Dr. Darrick Penna: Internal hemorrhoids, mild diverticulosis. Random colon biopsies negative.  . COLONOSCOPY N/A 11/08/2013   SLF: Normal mucosa in the terminal ileum/The colon IS redundant/  Moderate  diverticulosis throughout the entire colon. ileum bx benign. colon bx benign  . ESOPHAGOGASTRODUODENOSCOPY  2008   Barrett's without dysplasia. esphagus dilated. antral erosions, h.pylori serologies negative.  . ESOPHAGOGASTRODUODENOSCOPY  11/2011   Dr. Darrick Penna: Barrett's esophagus, mild gastritis, diverticulum in the second portion of the duodenum repeat EGD 3 years. Small bowel biopsies negative. Gastric biopsy show reactive gastropathy but no H. pylori. Esophageal biopsies consistent with GERD. Next EGD 11/2014  . ESOPHAGOGASTRODUODENOSCOPY N/A 11/21/2014   WUJ:WJXB non-erosive gastritis/irregular z-line  . GIVENS CAPSULE STUDY  12/07/2011   Proximal small bowel, rare AVM. Distal small bowel, multiple ulcers noted  . GIVENS CAPSULE STUDY N/A 09/27/2013   Distal small bowel ulcers extending to TI.  Marland Kitchen GIVENS CAPSULE STUDY N/A 10/10/2013  Procedure: GIVENS CAPSULE STUDY;  Surgeon: West Bali, MD;  Location: [facility];  Service: Endoscopy;  Laterality: N/A;  7:30  . IR KYPHO THORACIC WITH BONE BIOPSY  02/09/2018  . KNEE ARTHROSCOPY WITH MEDIAL MENISECTOMY Right 06/09/2016   Procedure: KNEE ARTHROSCOPY WITH MEDIAL MENISECTOMY;  Surgeon: Vickki Hearing, MD;  Location: [facility] ORS;  Service: Orthopedics;  Laterality: Right;  medial and lateral menisectomy  . LAMINECTOMY  1995   L4-L5  . LAPAROSCOPIC CHOLECYSTECTOMY  1990s  . LEFT HEART CATHETERIZATION WITH CORONARY ANGIOGRAM  01/10/2014   Procedure: LEFT HEART CATHETERIZATION WITH CORONARY ANGIOGRAM;  Surgeon: Lesleigh Noe, MD;  Location: [facility] CATH LAB;  Service: Cardiovascular;;  . RIGHT HEART CATHETERIZATION N/A 01/10/2014   Procedure: RIGHT HEART CATH;  Surgeon: Lesleigh Noe, MD;  Location: [facility] CATH LAB;  Service: Cardiovascular;  Laterality: N/A;  . TOTAL ABDOMINAL HYSTERECTOMY  1999  . TRACHEOSTOMY TUBE PLACEMENT N/A 09/01/2017   Procedure: TRACHEOSTOMY;  Surgeon: Drema Halon, MD;  Location: [facility] OR;  Service: ENT;  Laterality: N/A;      Social History:   reports that she quit smoking about 37 years ago. Her smoking use included cigarettes. She started smoking about 54 years ago. She has a 3.75 pack-year smoking history. She has never used smokeless tobacco. She reports that she does not drink alcohol and does not use drugs.   Family History:  Her family history includes Alzheimer's disease in her mother; Breast cancer in her sister; Heart attack in her mother; Hypertension in her mother and another family member; Stroke in her mother. There is no history of Heart disease or Colon cancer.   Allergies Allergies  Allergen Reactions  . Citalopram Hydrobromide Other (See Comments)    Dyskinesia Other reaction(s): Other (See Comments) Dyskinesia  . Codeine Nausea And Vomiting and Other (See Comments)    HALLUCINATIONS Other reaction(s): Unknown Other reaction(s): Other (See Comments) HALLUCINATIONS   . Hydromorphone Hcl Other (See Comments)    Made her pass out Other reaction(s): Other (See Comments) Made her pass out  . Metoclopramide Other (See Comments)    DYSKINESIA Other reaction(s): Other (See Comments) DYSKINESIA  . Hyoscyamine     MADE DIARRHEA WORSE     Home Medications  Prior to Admission medications   Medication Sig Start Date End Date Taking? Authorizing Provider  acetaminophen (TYLENOL) 500 MG tablet Take 500 mg by mouth as needed for moderate pain or headache.     [provider]  amiodarone (PACERONE) 200 MG tablet TAKE 1 TABLET BY MOUTH EVERY MORNING 03/03/20   Strader, [place] M, PA-C  apixaban (ELIQUIS) 5 MG TABS tablet Take 1 tablet (5 mg total) by mouth 2 (two) times daily. 02/14/20   Antoine Poche, MD  Cholecalciferol (VITAMIN D3) 2000 units capsule Take 2,000 Units by mouth daily. 10/25/17   [provider]  dicyclomine (BENTYL) 20 MG tablet 1 PO QAC TID Patient taking differently: Take 20 mg by mouth 3 (three) times daily before meals. 1 PO QAC TID 09/05/19   Fields,  Darleene Cleaver, MD  donepezil (ARICEPT) 5 MG tablet Take 5 mg by mouth daily.    [provider]  DULoxetine (CYMBALTA) 30 MG capsule Take 30 mg by mouth 2 (two) times daily. 11/24/17   [provider]  gabapentin (NEURONTIN) 400 MG capsule Take by mouth 3 (three) times daily. Take 1 capsule(400mg ) 3 times daily. 07/18/17   [provider]  glipiZIDE (GLUCOTROL) 10 MG  tablet Take 10 mg by mouth daily. 11/24/17   [provider]  HYDROcodone-acetaminophen (NORCO) 10-325 MG tablet Take 1 tablet by mouth as directed. Takes 1/2 tablet at bedtime. 07/02/18   [provider]  insulin glargine (LANTUS) 100 UNIT/ML injection Inject 0.55 mLs (55 Units total) into the skin at bedtime. Patient taking differently: Inject 20-30 Units into the skin at bedtime. 08/22/17   Catarina Hartshorn, MD  Lactase 9000 units CHEW 1 PO Q4H WHEN YOU EAT ICE CREAM OR CHEESE Patient taking differently: Chew 1 tablet by mouth every 4 (four) hours. 1 PO Q4H WHEN YOU EAT ICE CREAM OR CHEESE 09/05/19   Fields, Darleene Cleaver, MD  levETIRAcetam (KEPPRA) 1000 MG tablet Take 1,500 mg by mouth 2 (two) times daily.     [provider]  levothyroxine (SYNTHROID) 175 MCG tablet Take 1 tablet by mouth daily. 05/20/19   [provider]  loperamide (IMODIUM) 2 MG capsule 1 PO Q4H PRN LOOSE STOOLS Patient taking differently: Take 4 mg by mouth every 4 (four) hours as needed. 1 PO Q4H PRN LOOSE STOOLS 09/05/19   Fields, Sandi L, MD  LORazepam (ATIVAN) 1 MG tablet Take 0.5 mg by mouth at bedtime as needed for anxiety or sleep. for anxiety 11/24/17   [provider]  magnesium oxide (MAG-OX) 400 MG tablet Take 1 tablet (400 mg total) by mouth 2 (two) times daily. 12/26/17   Antoine Poche, MD  meclizine (ANTIVERT) 25 MG tablet Take 25 mg by mouth as needed.  05/21/18   [provider]  metFORMIN (GLUCOPHAGE) 500 MG tablet Take 500 mg by mouth 2 (two) times daily with a meal.    [provider]  Multiple Vitamin (MULTIVITAMIN WITH MINERALS) TABS tablet Take 1 tablet by mouth daily.    [provider]  naproxen sodium (ALEVE) 220 MG tablet Take 440 mg by mouth as needed (pain).     [provider]  ondansetron (ZOFRAN) 4 MG tablet TAKE 1 TABLET BY MOUTH FOUR TIMES DAILY AS NEEDED FOR NAUSEA Patient taking differently: Take 4 mg by mouth 4 (four) times daily as needed for nausea. 01/10/18   Gelene Mink, NP  OXYGEN 2 L daily.     [provider]  pantoprazole (PROTONIX) 40 MG tablet TAKE ONE TABLET BY MOUTH TWICE DAILY BEFORE APPLY MEAL 02/07/17   Gelene Mink, NP  potassium chloride (KLOR-CON) 10 MEQ tablet Take 20 mEq by mouth daily. Take 2 tablets (20 meq) daily    [provider]  torsemide (DEMADEX) 20 MG tablet TAKE TWO TABLETS BY MOUTH TWICE DAILY 07/29/20   Antoine Poche, MD     Critical care time: 42 minutes  Coralyn Helling, MD [facility] Pulmonary/Critical Care Pager - [phone] 08/24/2020, 2:46 PM

## 2020-08-24 NOTE — ED Notes (Signed)
Repeated gas at 0155 , Obtained venous sample. Will repeat blood gas at 0400. Have lowered VT to 480 for 8 cc. , increased rate to 28 , peep has also been increased to 10. This all was done as Patients saturation had dropped to 80 on previous settings. Since this time patients saturation has increased to 100 percent.  Patient also started on nebulizer treatments for wheezes and rhonchi. Etsx is obtaining large amount of blood in et tube.

## 2020-08-24 NOTE — Plan of Care (Signed)
PCCM Plan of Care:  Patient accepted for transfer to [facility] or [facility] when bed available. Presented with unresponsiveness after a fall requiring intubation. Trauma scans negative for fracture. CT chest shows bilateral consolidative opacities with small bilateral effusions R>L; would favor pneumonia in this clinical context. Recent hospitalization at [facility] facility where [facility] performed without intervention.  RHC from 08/18/20: RA 20 PCW 27 PA 53 PVR 3.9 [place] units  These findings are consistent with pulmonary hypertension with at least some significant component of left-sided heart disease.  Marcelo Baldy, MD 08/24/20 2:54 AM

## 2020-08-24 NOTE — H&P (Signed)
History and Physical    SAVEAH BAHAR MWU:[id number] DOB: April 16, 1951 DOA: 08/23/2020  PCP: Gareth Morgan, MD   Patient coming from: home   Chief Complaint: Unresponsive respiratory failure.   HPI: [name] is a 70 y.o. female with medical history significant of chronic anemia, Barrett's esophagus, systolic heart failure, type 2 diabetes mellitus, atrial fibrillation, dyslipidemia, hypertension, seizures hypothyroidism.  Patient had a recent hospitalization at outside hospital, apparently she underwent cardiac work-up with coronary angiography.  At home she fell out of her bed, when EMS arrived she declined to be transported to the hospital.  Trough the course of the day she developed worsening mentation, to the point where she became unresponsive.  EMS was called again, she had bag valve mask ventilation and eventually an advanced airway was placed in the ED.   ED Course: Patient was diagnosed with acute respiratory failure due to multilobar pneumonia, she was intubated and placed broad-spectrum antibiotic and critical care was consulted.   Review of Systems: Unable to obtain due to critical condition.    Past Medical History:  Diagnosis Date  . Abnormal pulmonary function test   . Anemia    H/H of 10/30 with a normal MCV in 12/09  . Anxiety   . Arthritis   . Barrett's esophagus    Diagnosed 1995. Last EGD 2016-NO BARRETT'S.   . Chest pain    Negative cardiac catheterization in 2002; negative stress nuclear study in 2008  . Chronic anticoagulation   . Chronic combined systolic and diastolic CHF (congestive heart failure) (HCC)    a. EF predominantly normal during prior echoes but was 40% during 10/2014 echo. b. Most recent 01/2015 EF normal, 55-60%.  . Chronic LBP    Surgical intervention in 1996  . Diabetes mellitus, type 2 (HCC)    Insulin therapy; exacerbated by prednisone  . Dysrhythmia    AFib  . Gastroparesis    99% retention 05/2008 on GES  . GERD  (gastroesophageal reflux disease)   . Hiatal hernia   . Hyperlipidemia   . Hypertension   . Hypothyroid   . IBS (irritable bowel syndrome)   . Obesity   . OSA on CPAP    had CPAP and cannot tolerate.  . Paroxysmal atrial fibrillation (HCC)   . Pulmonary hypertension (HCC) 01/2015   a. Predominantly pulmonary venous hypertension but may be component of PAH.  . Seizures (HCC)    last seizure was 2 years ago; on keppra for this; unknown etiology  . Syncope    a. Admitted 05/2009; magnetic resonance imagin/ MRA - negative; etiology thought to be orthostasis secondary to drugs and dehydration. b. Syncope 02/2015 also felt 2/2 dehydration.    Past Surgical History:  Procedure Laterality Date  . BACK SURGERY  1996  . BIOPSY N/A 11/08/2013   Procedure: BIOPSY  / Tissue sampling / ulcers present in small intestine;  Surgeon: West Bali, MD;  Location: [facility];  Service: Endoscopy;  Laterality: N/A;  . CARDIAC CATHETERIZATION  2002  . CARDIAC CATHETERIZATION N/A 01/26/2015   Procedure: Right Heart Cath;  Surgeon: Laurey Morale, MD;  Location: [facility] INVASIVE CV LAB;  Service: Cardiovascular;  Laterality: N/A;  . CARDIOVASCULAR STRESS TEST  2008   Stress nuclear study  . CARDIOVERSION N/A 03/06/2015   Procedure: CARDIOVERSION;  Surgeon: Laurey Morale, MD;  Location: [facility] ENDOSCOPY;  Service: Cardiovascular;  Laterality: N/A;  . CARPAL TUNNEL RELEASE  1994  . COLONOSCOPY  11/2011  Dr. Darrick Penna: Internal hemorrhoids, mild diverticulosis. Random colon biopsies negative.  . COLONOSCOPY N/A 11/08/2013   SLF: Normal mucosa in the terminal ileum/The colon IS redundant/  Moderate diverticulosis throughout the entire colon. ileum bx benign. colon bx benign  . ESOPHAGOGASTRODUODENOSCOPY  2008   Barrett's without dysplasia. esphagus dilated. antral erosions, h.pylori serologies negative.  . ESOPHAGOGASTRODUODENOSCOPY  11/2011   Dr. Darrick Penna: Barrett's esophagus, mild gastritis, diverticulum in the second  portion of the duodenum repeat EGD 3 years. Small bowel biopsies negative. Gastric biopsy show reactive gastropathy but no H. pylori. Esophageal biopsies consistent with GERD. Next EGD 11/2014  . ESOPHAGOGASTRODUODENOSCOPY N/A 11/21/2014   IOX:BDZH non-erosive gastritis/irregular z-line  . GIVENS CAPSULE STUDY  12/07/2011   Proximal small bowel, rare AVM. Distal small bowel, multiple ulcers noted  . GIVENS CAPSULE STUDY N/A 09/27/2013   Distal small bowel ulcers extending to TI.  Marland Kitchen GIVENS CAPSULE STUDY N/A 10/10/2013   Procedure: GIVENS CAPSULE STUDY;  Surgeon: West Bali, MD;  Location: [facility];  Service: Endoscopy;  Laterality: N/A;  7:30  . IR KYPHO THORACIC WITH BONE BIOPSY  02/09/2018  . KNEE ARTHROSCOPY WITH MEDIAL MENISECTOMY Right 06/09/2016   Procedure: KNEE ARTHROSCOPY WITH MEDIAL MENISECTOMY;  Surgeon: Vickki Hearing, MD;  Location: [facility] ORS;  Service: Orthopedics;  Laterality: Right;  medial and lateral menisectomy  . LAMINECTOMY  1995   L4-L5  . LAPAROSCOPIC CHOLECYSTECTOMY  1990s  . LEFT HEART CATHETERIZATION WITH CORONARY ANGIOGRAM  01/10/2014   Procedure: LEFT HEART CATHETERIZATION WITH CORONARY ANGIOGRAM;  Surgeon: Lesleigh Noe, MD;  Location: [facility] CATH LAB;  Service: Cardiovascular;;  . RIGHT HEART CATHETERIZATION N/A 01/10/2014   Procedure: RIGHT HEART CATH;  Surgeon: Lesleigh Noe, MD;  Location: [facility] CATH LAB;  Service: Cardiovascular;  Laterality: N/A;  . TOTAL ABDOMINAL HYSTERECTOMY  1999  . TRACHEOSTOMY TUBE PLACEMENT N/A 09/01/2017   Procedure: TRACHEOSTOMY;  Surgeon: Drema Halon, MD;  Location: [facility] OR;  Service: ENT;  Laterality: N/A;     reports that she quit smoking about 37 years ago. Her smoking use included cigarettes. She started smoking about 54 years ago. She has a 3.75 pack-year smoking history. She has never used smokeless tobacco. She reports that she does not drink alcohol and does not use drugs.  Allergies  Allergen Reactions  .  Citalopram Hydrobromide Other (See Comments)    Dyskinesia Other reaction(s): Other (See Comments) Dyskinesia  . Codeine Nausea And Vomiting and Other (See Comments)    HALLUCINATIONS Other reaction(s): Unknown Other reaction(s): Other (See Comments) HALLUCINATIONS   . Hydromorphone Hcl Other (See Comments)    Made her pass out Other reaction(s): Other (See Comments) Made her pass out  . Metoclopramide Other (See Comments)    DYSKINESIA Other reaction(s): Other (See Comments) DYSKINESIA  . Hyoscyamine     MADE DIARRHEA WORSE    Family History  Problem Relation Age of Onset  . Hypertension Mother   . Alzheimer's disease Mother   . Stroke Mother   . Heart attack Mother   . Hypertension Other   . Breast cancer Sister   . Heart disease Neg Hx   . Colon cancer Neg Hx      Prior to Admission medications   Medication Sig Start Date End Date Taking? Authorizing Provider  acetaminophen (TYLENOL) 500 MG tablet Take 500 mg by mouth as needed for moderate pain or headache.     [provider]  amiodarone (PACERONE) 200 MG tablet TAKE  1 TABLET BY MOUTH EVERY MORNING 03/03/20   Strader, GrenadaBrittany M, PA-C  apixaban (ELIQUIS) 5 MG TABS tablet Take 1 tablet (5 mg total) by mouth 2 (two) times daily. 02/14/20   Antoine PocheBranch, Jonathan F, MD  Cholecalciferol (VITAMIN D3) 2000 units capsule Take 2,000 Units by mouth daily. 10/25/17   [provider]  dicyclomine (BENTYL) 20 MG tablet 1 PO QAC TID Patient taking differently: Take 20 mg by mouth 3 (three) times daily before meals. 1 PO QAC TID 09/05/19   Fields, Darleene CleaverSandi L, MD  donepezil (ARICEPT) 5 MG tablet Take 5 mg by mouth daily.    [provider]  DULoxetine (CYMBALTA) 30 MG capsule Take 30 mg by mouth 2 (two) times daily. 11/24/17   [provider]  gabapentin (NEURONTIN) 400 MG capsule Take by mouth 3 (three) times daily. Take 1 capsule(400mg ) 3 times daily. 07/18/17   [provider]  glipiZIDE (GLUCOTROL)  10 MG tablet Take 10 mg by mouth daily. 11/24/17   [provider]  HYDROcodone-acetaminophen (NORCO) 10-325 MG tablet Take 1 tablet by mouth as directed. Takes 1/2 tablet at bedtime. 07/02/18   [provider]  insulin glargine (LANTUS) 100 UNIT/ML injection Inject 0.55 mLs (55 Units total) into the skin at bedtime. Patient taking differently: Inject 20-30 Units into the skin at bedtime. 08/22/17   Catarina Hartshornat, David, MD  Lactase 9000 units CHEW 1 PO Q4H WHEN YOU EAT ICE CREAM OR CHEESE Patient taking differently: Chew 1 tablet by mouth every 4 (four) hours. 1 PO Q4H WHEN YOU EAT ICE CREAM OR CHEESE 09/05/19   Fields, Darleene CleaverSandi L, MD  levETIRAcetam (KEPPRA) 1000 MG tablet Take 1,500 mg by mouth 2 (two) times daily.     [provider]  levothyroxine (SYNTHROID) 175 MCG tablet Take 1 tablet by mouth daily. 05/20/19   [provider]  loperamide (IMODIUM) 2 MG capsule 1 PO Q4H PRN LOOSE STOOLS Patient taking differently: Take 4 mg by mouth every 4 (four) hours as needed. 1 PO Q4H PRN LOOSE STOOLS 09/05/19   Fields, Sandi L, MD  LORazepam (ATIVAN) 1 MG tablet Take 0.5 mg by mouth at bedtime as needed for anxiety or sleep. for anxiety 11/24/17   [provider]  magnesium oxide (MAG-OX) 400 MG tablet Take 1 tablet (400 mg total) by mouth 2 (two) times daily. 12/26/17   Antoine PocheBranch, Jonathan F, MD  meclizine (ANTIVERT) 25 MG tablet Take 25 mg by mouth as needed.  05/21/18   [provider]  metFORMIN (GLUCOPHAGE) 500 MG tablet Take 500 mg by mouth 2 (two) times daily with a meal.    [provider]  Multiple Vitamin (MULTIVITAMIN WITH MINERALS) TABS tablet Take 1 tablet by mouth daily.    [provider]  naproxen sodium (ALEVE) 220 MG tablet Take 440 mg by mouth as needed (pain).     [provider]  ondansetron (ZOFRAN) 4 MG tablet TAKE 1 TABLET BY MOUTH FOUR TIMES DAILY AS NEEDED FOR NAUSEA Patient taking differently: Take 4 mg by mouth 4 (four)  times daily as needed for nausea. 01/10/18   Gelene MinkBoone, Anna W, NP  OXYGEN 2 L daily.     [provider]  pantoprazole (PROTONIX) 40 MG tablet TAKE ONE TABLET BY MOUTH TWICE DAILY BEFORE APPLY MEAL 02/07/17   Gelene MinkBoone, Anna W, NP  potassium chloride (KLOR-CON) 10 MEQ tablet Take 20 mEq by mouth daily. Take 2 tablets (20 meq) daily    [provider]  torsemide (DEMADEX) 20 MG tablet TAKE TWO TABLETS BY MOUTH TWICE DAILY 07/29/20   Antoine Poche, MD    Physical Exam: Vitals:   08/24/20 0645 08/24/20 0700 08/24/20 0741 08/24/20 0822  BP:  (!) 121/37    Pulse: (!) 106 75    Resp: (!) 22 (!) 28    Temp: 100 F (37.8 C) 100 F (37.8 C) 100 F (37.8 C)   TempSrc:   Bladder   SpO2: (!) 88% 90%  91%  Weight:      Height:        Vitals:   08/24/20 0645 08/24/20 0700 08/24/20 0741 08/24/20 0822  BP:  (!) 121/37    Pulse: (!) 106 75    Resp: (!) 22 (!) 28    Temp: 100 F (37.8 C) 100 F (37.8 C) 100 F (37.8 C)   TempSrc:   Bladder   SpO2: (!) 88% 90%  91%  Weight:      Height:       General: patient sedated, ill looking appearing  Neurology: sedated, grimaces to pain, not opening eyes or following commands.  Head and Neck. Head normocephalic. Neck supple with no adenopathy or thyromegaly.   E ENT: mild pallor, no icterus, oral mucosa moist Cardiovascular: No JVD. S1-S2 present, irregularly irregular, no gallops, rubs, or murmurs. No lower extremity edema. Pulmonary: positive breath sounds bilaterally, with no wheezing, scattered rhonchi or rales. Gastrointestinal. Abdomen protuberant, not distended and non tender Skin.ecchymosis on left lower quadrant abdomen.  Musculoskeletal: no joint deformities    Labs on Admission: I have personally reviewed following labs and imaging studies  CBC: Recent Labs  Lab 08/23/20 2305  WBC 11.4*  HGB 10.4*  HCT 36.4  MCV 115.2*  PLT 234   Basic Metabolic Panel: Recent Labs  Lab 08/23/20 2305  NA 142  K 4.8  CL 100   CO2 34*  GLUCOSE 215*  BUN 39*  CREATININE 2.15*  CALCIUM 8.9   GFR: Estimated Creatinine Clearance: 29.2 mL/min (A) (by C-G formula based on SCr of 2.15 mg/dL (H)). Liver Function Tests: Recent Labs  Lab 08/23/20 2305  AST 31  ALT 28  ALKPHOS 71  BILITOT 0.5  PROT 6.6  ALBUMIN 3.4*   No results for input(s): LIPASE, AMYLASE in the last 168 hours. No results for input(s): AMMONIA in the last 168 hours. Coagulation Profile: Recent Labs  Lab 08/23/20 2305  INR 1.8*   Cardiac Enzymes: Recent Labs  Lab 08/23/20 2305  CKTOTAL 49   BNP (last 3 results) No results for input(s): PROBNP in the last 8760 hours. HbA1C: No results for input(s): HGBA1C in the last 72 hours. CBG: No results for input(s): GLUCAP in the last 168 hours. Lipid Profile: No results for input(s): CHOL, HDL, LDLCALC, TRIG, CHOLHDL, LDLDIRECT in the last 72 hours. Thyroid Function Tests: No results for input(s): TSH, T4TOTAL, FREET4, T3FREE, THYROIDAB in the last 72 hours. Anemia Panel: No results for input(s): VITAMINB12, FOLATE, FERRITIN, TIBC, IRON, RETICCTPCT in the last 72 hours. Urine analysis:    Component Value Date/Time   COLORURINE AMBER (A) 08/23/2020 2346   APPEARANCEUR HAZY (A) 08/23/2020 2346   LABSPEC 1.011 08/23/2020 2346   PHURINE 5.0 08/23/2020 2346   GLUCOSEU >=500 (A) 08/23/2020 2346   HGBUR NEGATIVE 08/23/2020 2346   BILIRUBINUR NEGATIVE 08/23/2020 2346   KETONESUR NEGATIVE 08/23/2020 2346   PROTEINUR NEGATIVE 08/23/2020 2346   UROBILINOGEN 0.2 02/05/2015 1700   NITRITE NEGATIVE 08/23/2020 2346   LEUKOCYTESUR  NEGATIVE 08/23/2020 2346    Radiological Exams on Admission: CT ABDOMEN PELVIS WO CONTRAST  Result Date: 08/24/2020 CLINICAL DATA:  Status post fall, unresponsive. EXAM: CT CHEST, ABDOMEN AND PELVIS WITHOUT CONTRAST TECHNIQUE: Multidetector CT imaging of the chest, abdomen and pelvis was performed following the standard protocol without IV contrast. COMPARISON:   None. FINDINGS: CT CHEST FINDINGS Cardiovascular: A right internal jugular venous catheter is in place. There is moderate severity calcification of the aortic arch. There is moderate severity cardiomegaly. No pericardial effusion. Mediastinum/Nodes: No enlarged mediastinal, hilar, or axillary lymph nodes. Thyroid gland, trachea, and esophagus demonstrate no significant findings. Lungs/Pleura: Endotracheal and nasogastric tubes are in place. Moderate severity left upper lobe and bilateral lower lobe atelectasis and/or infiltrate is noted. Small bilateral pleural effusions are noted, right greater than left. No pneumothorax is identified. Musculoskeletal: There is evidence of prior vertebroplasty at the level of T12. A chronic fracture deformity of the sternum is seen. Multilevel degenerative changes are noted throughout the thoracic spine. CT ABDOMEN PELVIS FINDINGS Hepatobiliary: No focal liver abnormality is seen. Status post cholecystectomy. No biliary dilatation. Pancreas: Unremarkable. No pancreatic ductal dilatation or surrounding inflammatory changes. Spleen: Normal in size without focal abnormality. Adrenals/Urinary Tract: Adrenal glands are unremarkable. Kidneys are normal, without renal calculi, focal lesion, or hydronephrosis. A Foley catheter is seen within the urinary bladder. Stomach/Bowel: A nasogastric tube is seen with its distal tip noted within the proximal duodenum. The stomach is otherwise within normal limits. The appendix is not clearly visualized. No evidence of bowel wall thickening, distention, or inflammatory changes. Noninflamed diverticula are seen throughout the sigmoid colon. Vascular/Lymphatic: Aortic atherosclerosis. No enlarged abdominal or pelvic lymph nodes. Reproductive: Status post hysterectomy. No adnexal masses. Other: There is a 2.5 cm x 4.5 cm fat containing umbilical hernia. No abdominopelvic ascites. Musculoskeletal: Multilevel degenerative changes seen throughout the  lumbar spine. IMPRESSION: 1. Moderate severity left upper lobe and bilateral lower lobe atelectasis and/or infiltrate. 2. Small bilateral pleural effusions, right greater than left. 3. Moderate severity cardiomegaly. 4. Evidence of prior vertebroplasty at the level of T12. 5. Sigmoid diverticulosis. 6. Fat-containing umbilical hernia. 7. Aortic atherosclerosis. Aortic Atherosclerosis (ICD10-I70.0). Electronically Signed   By: Aram Candela M.D.   On: 08/24/2020 00:39   CT HEAD WO CONTRAST  Result Date: 08/24/2020 CLINICAL DATA:  Status post fall, unresponsive. EXAM: CT HEAD WITHOUT CONTRAST TECHNIQUE: Contiguous axial images were obtained from the base of the skull through the vertex without intravenous contrast. COMPARISON:  August 15, 2020 FINDINGS: Brain: There is mild cerebral atrophy with widening of the extra-axial spaces and ventricular dilatation. There are areas of decreased attenuation within the white matter tracts of the supratentorial brain, consistent with microvascular disease changes. Vascular: No hyperdense vessel or unexpected calcification. Skull: Normal. Negative for fracture or focal lesion. Sinuses/Orbits: No acute finding. Other: Endotracheal and orogastric tubes are in place. IMPRESSION: No acute intracranial pathology. Electronically Signed   By: Aram Candela M.D.   On: 08/24/2020 00:30   CT Chest Wo Contrast  Result Date: 08/24/2020 CLINICAL DATA:  Status post fall, unresponsive. EXAM: CT CHEST, ABDOMEN AND PELVIS WITHOUT CONTRAST TECHNIQUE: Multidetector CT imaging of the chest, abdomen and pelvis was performed following the standard protocol without IV contrast. COMPARISON:  None. FINDINGS: CT CHEST FINDINGS Cardiovascular: A right internal jugular venous catheter is in place. There is moderate severity calcification of the aortic arch. There is moderate severity cardiomegaly. No pericardial effusion. Mediastinum/Nodes: No enlarged mediastinal, hilar, or axillary lymph  nodes. Thyroid  gland, trachea, and esophagus demonstrate no significant findings. Lungs/Pleura: Endotracheal and nasogastric tubes are in place. Moderate severity left upper lobe and bilateral lower lobe atelectasis and/or infiltrate is noted. Small bilateral pleural effusions are noted, right greater than left. No pneumothorax is identified. Musculoskeletal: There is evidence of prior vertebroplasty at the level of T12. A chronic fracture deformity of the sternum is seen. Multilevel degenerative changes are noted throughout the thoracic spine. CT ABDOMEN PELVIS FINDINGS Hepatobiliary: No focal liver abnormality is seen. Status post cholecystectomy. No biliary dilatation. Pancreas: Unremarkable. No pancreatic ductal dilatation or surrounding inflammatory changes. Spleen: Normal in size without focal abnormality. Adrenals/Urinary Tract: Adrenal glands are unremarkable. Kidneys are normal, without renal calculi, focal lesion, or hydronephrosis. A Foley catheter is seen within the urinary bladder. Stomach/Bowel: A nasogastric tube is seen with its distal tip noted within the proximal duodenum. The stomach is otherwise within normal limits. The appendix is not clearly visualized. No evidence of bowel wall thickening, distention, or inflammatory changes. Noninflamed diverticula are seen throughout the sigmoid colon. Vascular/Lymphatic: Aortic atherosclerosis. No enlarged abdominal or pelvic lymph nodes. Reproductive: Status post hysterectomy. No adnexal masses. Other: There is a 2.5 cm x 4.5 cm fat containing umbilical hernia. No abdominopelvic ascites. Musculoskeletal: Multilevel degenerative changes seen throughout the lumbar spine. IMPRESSION: 1. Moderate severity left upper lobe and bilateral lower lobe atelectasis and/or infiltrate. 2. Small bilateral pleural effusions, right greater than left. 3. Moderate severity cardiomegaly. 4. Evidence of prior vertebroplasty at the level of T12. 5. Sigmoid diverticulosis. 6.  Fat-containing umbilical hernia. 7. Aortic atherosclerosis. Aortic Atherosclerosis (ICD10-I70.0). Electronically Signed   By: Aram Candela M.D.   On: 08/24/2020 00:40   CT CERVICAL SPINE WO CONTRAST  Result Date: 08/24/2020 CLINICAL DATA:  Status post fall, unresponsive. EXAM: CT CERVICAL SPINE WITHOUT CONTRAST TECHNIQUE: Multidetector CT imaging of the cervical spine was performed without intravenous contrast. Multiplanar CT image reconstructions were also generated. COMPARISON:  October 04, 2012 FINDINGS: Alignment: Normal. Skull base and vertebrae: No acute fracture. No primary bone lesion or focal pathologic process. Soft tissues and spinal canal: No prevertebral fluid or swelling. No visible canal hematoma. Disc levels: Marked severity endplate sclerosis and osteophyte formation is seen at the levels of C3-C4, C4-C5, C5-C6 and C6-C7. There is marked severity narrowing of the anterior atlantoaxial articulation. Mild to moderate severity intervertebral disc space narrowing is seen at the levels of C5-C6 and C6-C7. Moderate severity bilateral multilevel facet joint hypertrophy is noted. Upper chest: Negative. Other: Endotracheal and orogastric tubes are noted. IMPRESSION: 1. No acute osseous abnormality. 2. Marked severity multilevel degenerative changes, most prominent at the levels of C3-C4, C4-C5, C5-C6 and C6-C7. Electronically Signed   By: Aram Candela M.D.   On: 08/24/2020 00:32   DG Chest Portable 1 View  Result Date: 08/23/2020 CLINICAL DATA:  Status post intubation. EXAM: PORTABLE CHEST 1 VIEW COMPARISON:  August 17, 2020 FINDINGS: An endotracheal tube is seen with its distal tip approximately 2.1 cm from the carina. A nasogastric tube is noted with its distal end seen within the expected region of the gastric antrum. There is a right internal jugular venous catheter. Its distal tip is seen within the right atrium, approximately 5.2 cm distal to the junction of the superior vena cava and  right atrium. Mild atelectasis and/or infiltrate is noted within the right lung base. There is no evidence of a pleural effusion or pneumothorax. The cardiac silhouette is markedly enlarged. The visualized skeletal structures are unremarkable. IMPRESSION: 1. Interval  endotracheal tube, nasogastric tube and right internal jugular venous catheter placement and positioning, as described above. 2. Mild right basilar atelectasis and/or infiltrate. Electronically Signed   By: Aram Candela M.D.   On: 08/23/2020 23:56    EKG: Independently reviewed.  91 bpm, left axis deviation, left anterior fascicular block, right bundle branch block, atrial fibrillation rhythm, no significant ST segment or T wave changes.  Assessment/Plan Principal Problem:   Lobar pneumonia (HCC) Active Problems:   Hypothyroidism   Hyperlipidemia   Essential hypertension   Atrial fibrillation (HCC)   Congestive heart failure (HCC)   Gastroparesis   Seizure (HCC)   Dementia (HCC)   DM type 2 (diabetes mellitus, type 2) (HCC)   Respiratory failure with hypoxia (HCC)   Fibromyalgia   Septic shock (HCC)   70 year old female with multiple medical problems, recent hospitalization at outside hospital.  Apparently at home she had progressive altered mentation to the point where she became unresponsive, and required temporary.  I was unable to reach her daughter to obtain more information. Her current vital signs temperature 37.8 C, blood pressure 137/52, heart rate 87, respiratory 28, oxygen saturation 91%. She is sedated with propofol, 100% FiO2 on mechanical ventilation with 10 of PEEP, and norepinephrine 25 mcg/min.  She is sedated, her lungs had rhonchi and rales bilaterally, heart S1-S2, present, irregularly irregular, protuberant abdomen, lower extremity edema.  Initial ABG, pH 7.45, PCO2 45.9, PO2 84.2, bicarb 31.1, oxygen saturation 97.9.  Sodium 142, potassium 4.9, chloride 100, bicarb 34, glucose 218, BUN 39,  creatinine 2.15. Troponin I 195. White cell count 11.4, hemoglobin 10.4, hematocrit 36.4, platelets 234. SARS COVID-19 negative. Urinalysis 0-5 white cells, specific gravity 1.011.  Glucose > 500. Alcohol less than 10, salicylate less than 7.  Chest radiograph with ET tube above the carina, right ventricular vein central line.  Right lower lobe infiltrate with small pleural effusion, positive interstitial infiltrate left upper lobe. CT chest/ abdomen and pelvis with left upper lobe and bilateral lower lobe atelectasis/infiltrate. Bilateral pleural effusions more right than left. CT head and neck no acute changes.  Mrs. Bahani  admitted to the hospital with working diagnosis of septic shock due to right lower lobe pneumonia.  1. Septic shock due to right lower lobe pneumonia, acute hypoxic and hypercapnic respiratory failure.   Patient on vasopressor support, MAP is 77 this am.  On mechanical ventilation, mandatory mode PRVC:  Fi02 100%, PEEP 10, RR28, VT 480 ml, with no significant dyssynchrony.   Continue antibiotic therapy with broad spectrum antibiotic therapy with vancomycin and cefepime.  Sedation with propofol Wean vasopressor therapy as tolerated target MAP 65 or greater.  Gentle hydration with NS for now.  Decrease Fi02 to keep 02 saturation more than 92%.  Bronchodilator therapy with duoneb.   2. Diastolic heart failure. Old records personally reviewed echocardiogram from 2019 with EF 60 to 65%, moderate concentric hypertrophy. No clinical signs of exacerbation, hold on diuretic therapy, continue to wean vasopressors as tolerated.   3. Chronic atrial fibrillation. Resume amiodarone for rate control, continue anticoagulation with apixaban.   4. AKI on CKD stage 3a. Check renal function this am, continue hemodynamic support.  Documented urine output 450 ml since admission. Continue strict in and out monitoring.   5. Metabolic encephalopathy. Patient currently sedated,  continue neuro checks per unit protocol.  Aspiration precautions. Hold on psychotropic medications for now (donepezil and cymbalta).   6. Hx of seizures. No signs of active seizures, will continue with keppra.  7. Hypothyroid. Continue with levothyroxine.  8. T2DM/ dyslipidemia . Add insulin sliding scale for glucose cover and monitoring.   Statin as outpatient.   9. Obesity class 2. BMI is 37.0. patient high risk for inpatient complications.   Patient is critically ill with imminent risk for deterioration. Critical care time 60 minutes.   Status is: Inpatient  Remains inpatient appropriate because:IV treatments appropriate due to intensity of illness or inability to take PO   Dispo: The patient is from: Home              Anticipated d/c is to: Home              Anticipated d/c date is: > 3 days              Patient currently is not medically stable to d/c.   Difficult to place patient No   DVT prophylaxis: apixaban   Code Status:   full  Family Communication:  I spoke with her husband but not able to reach her daughter.     Consults called:  CC   Admission status:  Inpatient.    Karilynn Carranza Annett Gula MD [facility]   08/24/2020, 8:47 AM

## 2020-08-24 NOTE — Progress Notes (Signed)
Moved patient at end of shift to ICU, no problems encountered.

## 2020-08-24 NOTE — Progress Notes (Addendum)
Nutrition Follow-up  INTERVENTION:  -Vital High Protein @ 40 ml/hr via OGT   -Add 45 ml ProSource TF per tube TID     Tube feeding regimen provides 1080 kcal, 117 grams of protein, and 803 ml of H2O.    -Total calories tube feeding and sedation - 1245 kcal, 117 gr protein. Regimen will meet 66% energy and 100% protein goal.  NUTRITION DIAGNOSIS:   Inadequate oral intake related to inability to eat as evidenced by NPO status.   GOAL:  Provide needs based on ASPEN/SCCM guidelines  MONITOR:   Vent status,Labs,Skin,I & O's,Weight trends,TF tolerance  ASSESSMENT: Patient presents with septic shock, pneumonia, acute respiratory failure, acute kidney injury on CKD-3a, DM-2 and obesity. Hx of diastolic heart failure, chronic atrial fibrillation.  Patient intubated on ventilator support 2/20 @ 2324. Discussed with nursing. Patient tolerating tube feeding regimen. She is off pressors and sedation rate has decreased. Will continue with current nutrition care.  MV: 13.1 L/min Temp (24hrs), Avg:99.6 F (37.6 C), Min:99.1 F (37.3 C), Max:100.4 F (38 C)  Propofol: 6.24 ml/hr (165 kcal)  IV-0.45 NS @ 50 ml/hr   Labs: BUN-38 (H), Cr 1.16 (H),Glucose 352 (H).   CBG (last 3)  Recent Labs    08/27/20 0348 08/27/20 0804 08/27/20 1126  GLUCAP 350* 293* 369*     Intake/Output Summary (Last 24 hours) at 08/27/2020 1130 Last data filed at 08/27/2020 0527 Gross per 24 hour  Intake 1695.03 ml  Output 1425 ml  Net 270.03 ml   Net since admission- +2.579  Weight history reviewed. Range 104-110 kg the past year.  Medications: colace, novolog, keppra. levophed  Diet Order:   Diet Order            Diet NPO time specified  Diet effective now                 EDUCATION NEEDS:  Not appropriate for education at this time  Skin:  Skin Assessment: Reviewed RN Assessment  Last BM:  2/20  Height:   Ht Readings from Last 1 Encounters:  08/27/20 5\' 6"  (1.676 m)    Weight:    Wt Readings from Last 1 Encounters:  08/27/20 106.9 kg    Ideal Body Weight:   59 kg  BMI:  Body mass index is 38.04 kg/m.  Estimated Nutritional Needs:   Kcal:  1901  Protein:  118-124 gr  Fluid:  1900 ml daily  08/29/20 MS,RD,CSG,LDN Pager: Royann Shivers

## 2020-08-24 NOTE — ED Provider Notes (Signed)
[facility]-EMERGENCY DEPT [facility] Emergency Department Provider Note MRN:  [id number]  Arrival date & time: 08/24/20     Chief Complaint   Unresponsive History of Present Illness   [name] is a 70 y.o. year-old female with a history of CHF, A. fib presenting to the ED with chief complaint of unresponsive.  Patient had a recent hospitalization at outside hospital with heart catheterization, discharged recently.  Larey Seat out of bed this morning.  Refused transport with EMS.  Becoming more and more unresponsive at home.  With EMS completely unresponsive requiring bag-valve-mask ventilation.  I was unable to obtain an accurate HPI, PMH, or ROS due to the patient's altered mental status.  Level 5 caveat.  Review of Systems  Positive for unresponsiveness.  Patient's Health History    Past Medical History:  Diagnosis Date  . Abnormal pulmonary function test   . Anemia    H/H of 10/30 with a normal MCV in 12/09  . Anxiety   . Arthritis   . Barrett's esophagus    Diagnosed 1995. Last EGD 2016-NO BARRETT'S.   . Chest pain    Negative cardiac catheterization in 2002; negative stress nuclear study in 2008  . Chronic anticoagulation   . Chronic combined systolic and diastolic CHF (congestive heart failure) (HCC)    a. EF predominantly normal during prior echoes but was 40% during 10/2014 echo. b. Most recent 01/2015 EF normal, 55-60%.  . Chronic LBP    Surgical intervention in 1996  . Diabetes mellitus, type 2 (HCC)    Insulin therapy; exacerbated by prednisone  . Dysrhythmia    AFib  . Gastroparesis    99% retention 05/2008 on GES  . GERD (gastroesophageal reflux disease)   . Hiatal hernia   . Hyperlipidemia   . Hypertension   . Hypothyroid   . IBS (irritable bowel syndrome)   . Obesity   . OSA on CPAP    had CPAP and cannot tolerate.  . Paroxysmal atrial fibrillation (HCC)   . Pulmonary hypertension (HCC) 01/2015   a. Predominantly pulmonary venous hypertension but may  be component of PAH.  . Seizures (HCC)    last seizure was 2 years ago; on keppra for this; unknown etiology  . Syncope    a. Admitted 05/2009; magnetic resonance imagin/ MRA - negative; etiology thought to be orthostasis secondary to drugs and dehydration. b. Syncope 02/2015 also felt 2/2 dehydration.    Past Surgical History:  Procedure Laterality Date  . BACK SURGERY  1996  . BIOPSY N/A 11/08/2013   Procedure: BIOPSY  / Tissue sampling / ulcers present in small intestine;  Surgeon: West Bali, MD;  Location: [facility] ENDO SUITE;  Service: Endoscopy;  Laterality: N/A;  . CARDIAC CATHETERIZATION  2002  . CARDIAC CATHETERIZATION N/A 01/26/2015   Procedure: Right Heart Cath;  Surgeon: Laurey Morale, MD;  Location: [facility] INVASIVE CV LAB;  Service: Cardiovascular;  Laterality: N/A;  . CARDIOVASCULAR STRESS TEST  2008   Stress nuclear study  . CARDIOVERSION N/A 03/06/2015   Procedure: CARDIOVERSION;  Surgeon: Laurey Morale, MD;  Location[facility] END[facility]COPY;  Service: Cardiovascular;  Laterality: N/A;  . CARPAL TUNNEL RELEASE  1994  . COLONOSCOPY  11/2011   Dr. Darrick Penna: Internal hemorrhoids, mild diverticulosis. Random colon biopsies negative.  . COLONOSCOPY N/A 11/08/2013   SLF: Normal mucosa in the terminal ileum/The colon IS redundant/  Moderate diverticulosis throughout the entire colon. ileum bx benign. colon bx benign  . ESOPHAGOGASTRODUODENOSCOPY  2008  Barrett's without dysplasia. esphagus dilated. antral erosions, h.pylori serologies negative.  . ESOPHAGOGASTRODUODENOSCOPY  11/2011   Dr. Darrick Penna: Barrett's esophagus, mild gastritis, diverticulum in the second portion of the duodenum repeat EGD 3 years. Small bowel biopsies negative. Gastric biopsy show reactive gastropathy but no H. pylori. Esophageal biopsies consistent with GERD. Next EGD 11/2014  . ESOPHAGOGASTRODUODENOSCOPY N/A 11/21/2014   ZOX:WRUE non-erosive gastritis/irregular z-line  . GIVENS CAPSULE STUDY  12/07/2011   Proximal small bowel,  rare AVM. Distal small bowel, multiple ulcers noted  . GIVENS CAPSULE STUDY N/A 09/27/2013   Distal small bowel ulcers extending to TI.  Marland Kitchen GIVENS CAPSULE STUDY N/A 10/10/2013   Procedure: GIVENS CAPSULE STUDY;  Surgeon: West Bali, MD;  Location: [facility] ENDO SUITE;  Service: Endoscopy;  Laterality: N/A;  7:30  . IR KYPHO THORACIC WITH BONE BIOPSY  02/09/2018  . KNEE ARTHROSCOPY WITH MEDIAL MENISECTOMY Right 06/09/2016   Procedure: KNEE ARTHROSCOPY WITH MEDIAL MENISECTOMY;  Surgeon: Vickki Hearing, MD;  Location: [facility] ORS;  Service: Orthopedics;  Laterality: Right;  medial and lateral menisectomy  . LAMINECTOMY  1995   L4-L5  . LAPAROSCOPIC CHOLECYSTECTOMY  1990s  . LEFT HEART CATHETERIZATION WITH CORONARY ANGIOGRAM  01/10/2014   Procedure: LEFT HEART CATHETERIZATION WITH CORONARY ANGIOGRAM;  Surgeon: Lesleigh Noe, MD;  Location: [facility] CATH LAB;  Service: Cardiovascular;;  . RIGHT HEART CATHETERIZATION N/A 01/10/2014   Procedure: RIGHT HEART CATH;  Surgeon: Lesleigh Noe, MD;  Location: [facility] CATH LAB;  Service: Cardiovascular;  Laterality: N/A;  . TOTAL ABDOMINAL HYSTERECTOMY  1999  . TRACHEOSTOMY TUBE PLACEMENT N/A 09/01/2017   Procedure: TRACHEOSTOMY;  Surgeon: Drema Halon, MD;  Location: [facility] OR;  Service: ENT;  Laterality: N/A;    Family History  Problem Relation Age of Onset  . Hypertension Mother   . Alzheimer's disease Mother   . Stroke Mother   . Heart attack Mother   . Hypertension Other   . Breast cancer Sister   . Heart disease Neg Hx   . Colon cancer Neg Hx     Social History   Socioeconomic History  . Marital status: Married    Spouse name: Not on file  . Number of children: Not on file  . Years of education: Not on file  . Highest education level: Not on file  Occupational History  . Occupation: Passenger transport manager at [employer]    Employer: [employer]    Comment: disabled    Employer: RETIRED  Tobacco Use  . Smoking status: Former Smoker    Packs/day: 0.25    Years:  15.00    Pack years: 3.75    Types: Cigarettes    Start date: 02/26/1966    Quit date: 07/01/1983    Years since quitting: 37.1  . Smokeless tobacco: Never Used  . Tobacco comment: quit in 1984  Vaping Use  . Vaping Use: Never used  Substance and Sexual Activity  . Alcohol use: No    Alcohol/week: 0.0 standard drinks  . Drug use: No  . Sexual activity: Yes    Birth control/protection: None, Surgical  Other Topics Concern  . Not on file  Social History Narrative   Sedentary   4 children, "blended family"   Social Determinants of Health   Financial Resource Strain: Not on file  Food Insecurity: Not on file  Transportation Needs: Not on file  Physical Activity: Not on file  Stress: Not on file  Social Connections: Not on file  Intimate Partner  Violence: Not on file     Physical Exam   Vitals:   08/24/20 0600 08/24/20 0615  BP: (!) 102/43 (!) 128/51  Pulse: (!) 101 100  Resp: (!) 31 (!) 23  Temp: 99.9 F (37.7 C) 99.9 F (37.7 C)  SpO2: 96% 96%    CONSTITUTIONAL: Ill-appearing NEURO: GCS 3 EYES: Pupils 4 mm, nonreactive ENT/NECK:  no LAD, no JVD CARDIO: Regular rate, poorly perfused PULM:  CTAB no wheezing or rhonchi GI/GU:   non-distended, non-tender MSK/SPINE:  No gross deformities, no edema SKIN:  no rash, atraumatic PSYCH: Unable to assess  *Additional and/or pertinent findings included in MDM below  Diagnostic and Interventional Summary    EKG Interpretation  Date/Time:  Sunday August 23 2020 23:07:36 EST Ventricular Rate:  91 PR Interval:    QRS Duration: 123 QT Interval:  391 QTC Calculation: 482 R Axis:   -64 Text Interpretation: Atrial fibrillation RBBB and LAFB Nonspecific T abnormalities, lateral leads Baseline wander in lead(s) V5 Confirmed by Kennis Carina [phone]) on 08/24/2020 12:41:43 AM      Labs Reviewed  URINALYSIS, ROUTINE W REFLEX MICROSCOPIC - Abnormal; Notable for the following components:      Result Value   Color, Urine  AMBER (*)    APPearance HAZY (*)    Glucose, UA >=500 (*)    Bacteria, UA RARE (*)    All other components within normal limits  CBC - Abnormal; Notable for the following components:   WBC 11.4 (*)    RBC 3.16 (*)    Hemoglobin 10.4 (*)    MCV 115.2 (*)    MCHC 28.6 (*)    nRBC 1.0 (*)    All other components within normal limits  COMPREHENSIVE METABOLIC PANEL - Abnormal; Notable for the following components:   CO2 34 (*)    Glucose, Bld 215 (*)    BUN 39 (*)    Creatinine, Ser 2.15 (*)    Albumin 3.4 (*)    GFR, Estimated 24 (*)    All other components within normal limits  SALICYLATE LEVEL - Abnormal; Notable for the following components:   Salicylate Lvl <7.0 (*)    All other components within normal limits  APTT - Abnormal; Notable for the following components:   aPTT 38 (*)    All other components within normal limits  PROTIME-INR - Abnormal; Notable for the following components:   Prothrombin Time 20.7 (*)    INR 1.8 (*)    All other components within normal limits  BLOOD GAS, ARTERIAL - Abnormal; Notable for the following components:   pH, Arterial 7.453 (*)    Bicarbonate 31.1 (*)    Acid-Base Excess 7.6 (*)    Allens test (pass/fail) NOT INDICATED (*)    All other components within normal limits  BLOOD GAS, ARTERIAL - Abnormal; Notable for the following components:   pCO2 arterial 49.3 (*)    pO2, Arterial 32.6 (*)    Bicarbonate 29.4 (*)    Acid-Base Excess 6.9 (*)    Allens test (pass/fail) NOT INDICATED (*)    All other components within normal limits  BLOOD GAS, ARTERIAL - Abnormal; Notable for the following components:   pH, Arterial 7.472 (*)    pO2, Arterial 66.7 (*)    Bicarbonate 30.9 (*)    Acid-Base Excess 7.3 (*)    All other components within normal limits  TROPONIN I (HIGH SENSITIVITY) - Abnormal; Notable for the following components:   Troponin I (High Sensitivity) 195 (*)  All other components within normal limits  TROPONIN I (HIGH  SENSITIVITY) - Abnormal; Notable for the following components:   Troponin I (High Sensitivity) 185 (*)    All other components within normal limits  RESP PANEL BY RT-PCR (FLU A&B, COVID) ARPGX2  LACTIC ACID, PLASMA  CK  ACETAMINOPHEN LEVEL  ETHANOL  TYPE AND SCREEN    CT HEAD WO CONTRAST  Final Result    CT CERVICAL SPINE WO CONTRAST  Final Result    CT Chest Wo Contrast  Final Result    CT ABDOMEN PELVIS WO CONTRAST  Final Result    DG Chest Portable 1 View  Final Result      Medications  sodium chloride 0.9 % bolus 1,000 mL (0 mLs Intravenous Hold 08/24/20 0107)  propofol (DIPRIVAN) 1000 MG/100ML infusion (25 mcg/kg/min  104 kg Intravenous Rate/Dose Change 08/24/20 0351)  ipratropium-albuterol (DUONEB) 0.5-2.5 (3) MG/3ML nebulizer solution 3 mL (has no administration in time range)  norepinephrine (LEVOPHED) 4mg  in 250mL premix infusion (30 mcg/min Intravenous New Bag/Given 08/24/20 0350)  EPINEPHrine (ADRENALIN) 1 MG/10ML injection (0.1 mg Intravenous Given 08/23/20 2309)  flumazenil (ROMAZICON) injection (100 mg Intravenous Given 08/23/20 2310)  sodium bicarbonate injection ( Intravenous Canceled Entry 08/23/20 2315)  0.9 %  sodium chloride infusion (0 mLs Intravenous Stopped 08/24/20 0028)  norepinephrine (LEVOPHED) 4-5 MG/250ML-% infusion SOLN (15 mcg/min  Rate/Dose Change 08/24/20 0130)  ceFEPIme (MAXIPIME) 2 g in sodium chloride 0.9 % 100 mL IVPB (0 g Intravenous Stopped 08/24/20 0219)  albuterol (PROVENTIL) (2.5 MG/3ML) 0.083% nebulizer solution (2.5 mg  Given 08/24/20 0116)  ipratropium (ATROVENT) 0.02 % nebulizer solution (0.5 mg  Given 08/24/20 0117)  albuterol (PROVENTIL) (2.5 MG/3ML) 0.083% nebulizer solution (2.5 mg  Given 08/24/20 0116)  albuterol (PROVENTIL) (2.5 MG/3ML) 0.083% nebulizer solution (5 mg  Given 08/24/20 0138)  norepinephrine (LEVOPHED) 4-5 MG/250ML-% infusion SOLN (  Stopped 08/24/20 0351)     Procedures  /  Critical Care .Critical Care Performed by:  Sabas SousBero, Delana Manganello M, MD Authorized by: Sabas SousBero, Eulis Salazar M, MD   Critical care provider statement:    Critical care time (minutes):  88   Critical care was necessary to treat or prevent imminent or life-threatening deterioration of the following conditions:  Respiratory failure   Critical care was time spent personally by me on the following activities:  Discussions with consultants, evaluation of patient's response to treatment, examination of patient, ordering and performing treatments and interventions, ordering and review of laboratory studies, ordering and review of radiographic studies, pulse oximetry, re-evaluation of patient's condition, obtaining history from patient or surrogate and review of old charts Procedure Name: Intubation Date/Time: 08/24/2020 12:44 AM Performed by: Sabas SousBero, Marleni Gallardo M, MD Pre-anesthesia Checklist: Patient identified, Patient being monitored, Emergency Drugs available, Timeout performed and Suction available Oxygen Delivery Method: Non-rebreather mask Preoxygenation: Pre-oxygenation with 100% oxygen Induction Type: Rapid sequence Ventilation: Mask ventilation without difficulty Laryngoscope Size: Glidescope and 4 Grade View: Grade I Tube size: 7.5 mm Number of attempts: 1 Airway Equipment and Method: Rigid stylet Placement Confirmation: ETT inserted through vocal cords under direct vision,  CO2 detector and Breath sounds checked- equal and bilateral Secured at: 25 cm    .Central Line  Date/Time: 08/24/2020 12:44 AM Performed by: Sabas SousBero, Ria Redcay M, MD Authorized by: Sabas SousBero, Mariany Mackintosh M, MD   Consent:    Consent obtained:  Emergent situation Universal protocol:    Patient identity confirmed:  Anonymous protocol, patient vented/unresponsive Pre-procedure details:    Indication(s): central venous access  Skin preparation:  Chlorhexidine Procedure details:    Location:  R internal jugular   Procedural supplies:  Triple lumen   Catheter size:  7 Fr   Landmarks  identified: yes     Ultrasound guidance: yes     Ultrasound guidance timing: real time     Number of attempts:  1   Successful placement: yes   Post-procedure details:    Post-procedure:  Dressing applied and line sutured   Assessment:  Blood return through all ports, no pneumothorax on x-ray, placement verified by x-ray and free fluid flow   Procedure completion:  Tolerated well, no immediate complications Comments:     Central line placed using aseptic technique, placed cleanly but not fully sterile.  Recommend replacement or removal within 48 hours.    ED Course and Medical Decision Making  I have reviewed the triage vital signs, the nursing notes, and pertinent available records from the EMR.  Listed above are laboratory and imaging tests that I personally ordered, reviewed, and interpreted and then considered in my medical decision making (see below for details).  Unresponsive, inadequate breathing.  Requiring bag-valve-mask.  Initial O2 sats in the 60s.  With good seal and bag-valve-mask technique we were able to improve oxygen saturation to 100%.  Oral airway placed with no gag.  Pupils unresponsive.  Fall today, blood thinners, considering intracranial bleeding.  Given the lack of airway protection decision made to intubate as described above.  Patient also hypotensive, required 2 doses of push dose epinephrine and norepinephrine drip started.  Central access obtained as described above.  Awaiting remainder of CT imaging.  Will need intensivist admission.     CT imaging without evidence of traumatic injury, there is evidence of pneumonia.  Providing cefepime, excepted for admission by intensivist service.  Elmer Sow. Pilar Plate, MD [facility] Emergency Medicine [facility] [email or web address]  Final Clinical Impressions(s) / ED Diagnoses     ICD-10-CM   1. Unresponsive  R41.89   2. Acute respiratory failure with hypoxia (HCC)  J96.01     ED Discharge Orders     None       Discharge Instructions Discussed with and Provided to Patient:   Discharge Instructions   None       Sabas Sous, MD 08/24/20 [phone]

## 2020-08-25 ENCOUNTER — Inpatient Hospital Stay (HOSPITAL_COMMUNITY): Payer: PPO

## 2020-08-25 DIAGNOSIS — I4891 Unspecified atrial fibrillation: Secondary | ICD-10-CM

## 2020-08-25 DIAGNOSIS — Z794 Long term (current) use of insulin: Secondary | ICD-10-CM

## 2020-08-25 DIAGNOSIS — I10 Essential (primary) hypertension: Secondary | ICD-10-CM

## 2020-08-25 DIAGNOSIS — J9601 Acute respiratory failure with hypoxia: Secondary | ICD-10-CM | POA: Diagnosis not present

## 2020-08-25 DIAGNOSIS — E119 Type 2 diabetes mellitus without complications: Secondary | ICD-10-CM

## 2020-08-25 DIAGNOSIS — J181 Lobar pneumonia, unspecified organism: Secondary | ICD-10-CM | POA: Diagnosis not present

## 2020-08-25 DIAGNOSIS — I5022 Chronic systolic (congestive) heart failure: Secondary | ICD-10-CM | POA: Diagnosis not present

## 2020-08-25 LAB — SEDIMENTATION RATE: Sed Rate: 95 mm/hr — ABNORMAL HIGH (ref 0–22)

## 2020-08-25 LAB — TROPONIN I (HIGH SENSITIVITY)
Troponin I (High Sensitivity): 143 ng/L (ref <18)
Troponin I (High Sensitivity): 183 ng/L (ref <18)

## 2020-08-25 LAB — CBC
HCT: 29.2 % — ABNORMAL LOW (ref 36.0–46.0)
Hemoglobin: 9.2 g/dL — ABNORMAL LOW (ref 12.0–15.0)
MCH: 32.4 pg (ref 26.0–34.0)
MCHC: 31.5 g/dL (ref 30.0–36.0)
MCV: 102.8 fL — ABNORMAL HIGH (ref 80.0–100.0)
Platelets: 191 10*3/uL (ref 150–400)
RBC: 2.84 MIL/uL — ABNORMAL LOW (ref 3.87–5.11)
RDW: 14.7 % (ref 11.5–15.5)
WBC: 13 10*3/uL — ABNORMAL HIGH (ref 4.0–10.5)
nRBC: 0.8 % — ABNORMAL HIGH (ref 0.0–0.2)

## 2020-08-25 LAB — BLOOD GAS, ARTERIAL
Acid-Base Excess: 7.5 mmol/L — ABNORMAL HIGH (ref 0.0–2.0)
Bicarbonate: 31.4 mmol/L — ABNORMAL HIGH (ref 20.0–28.0)
Drawn by: 21310
FIO2: 60
MECHVT: 480 mL
O2 Saturation: 98.2 %
PEEP: 10 cmH2O
Patient temperature: 38.5
RATE: 24 resp/min
pCO2 arterial: 40.2 mmHg (ref 32.0–48.0)
pH, Arterial: 7.502 — ABNORMAL HIGH (ref 7.350–7.450)
pO2, Arterial: 112 mmHg — ABNORMAL HIGH (ref 83.0–108.0)

## 2020-08-25 LAB — GLUCOSE, CAPILLARY
Glucose-Capillary: 316 mg/dL — ABNORMAL HIGH (ref 70–99)
Glucose-Capillary: 285 mg/dL — ABNORMAL HIGH (ref 70–99)
Glucose-Capillary: 261 mg/dL — ABNORMAL HIGH (ref 70–99)
Glucose-Capillary: 209 mg/dL — ABNORMAL HIGH (ref 70–99)
Glucose-Capillary: 208 mg/dL — ABNORMAL HIGH (ref 70–99)

## 2020-08-25 LAB — BASIC METABOLIC PANEL
Anion gap: 12 (ref 5–15)
BUN: 37 mg/dL — ABNORMAL HIGH (ref 8–23)
CO2: 30 mmol/L (ref 22–32)
Calcium: 8.6 mg/dL — ABNORMAL LOW (ref 8.9–10.3)
Chloride: 100 mmol/L (ref 98–111)
Creatinine, Ser: 1.56 mg/dL — ABNORMAL HIGH (ref 0.44–1.00)
GFR, Estimated: 35 mL/min — ABNORMAL LOW (ref >60)
Glucose, Bld: 320 mg/dL — ABNORMAL HIGH (ref 70–99)
Potassium: 3.4 mmol/L — ABNORMAL LOW (ref 3.5–5.1)
Sodium: 142 mmol/L (ref 135–145)

## 2020-08-25 LAB — MAGNESIUM: Magnesium: 2.4 mg/dL (ref 1.7–2.4)

## 2020-08-25 LAB — PHOSPHORUS: Phosphorus: 2.8 mg/dL (ref 2.5–4.6)

## 2020-08-25 LAB — TRIGLYCERIDES: Triglycerides: 120 mg/dL (ref <150)

## 2020-08-25 LAB — POTASSIUM: Potassium: 3.3 mmol/L — ABNORMAL LOW (ref 3.5–5.1)

## 2020-08-25 MED ORDER — SODIUM CHLORIDE 0.9 % IV BOLUS
500.0000 mL | Freq: Once | INTRAVENOUS | Status: AC
  Administered 2020-08-25: 500 mL via INTRAVENOUS

## 2020-08-25 MED ORDER — ACETAMINOPHEN 500 MG PO TABS
1000.0000 mg | ORAL_TABLET | Freq: Once | ORAL | Status: AC
  Administered 2020-08-25: 1000 mg via ORAL
  Filled 2020-08-25: qty 2

## 2020-08-25 MED ORDER — INSULIN GLARGINE 100 UNIT/ML ~~LOC~~ SOLN
10.0000 [IU] | Freq: Every day | SUBCUTANEOUS | Status: DC
  Administered 2020-08-25 – 2020-08-26 (×2): 10 [IU] via SUBCUTANEOUS
  Filled 2020-08-25 (×4): qty 0.1

## 2020-08-25 MED ORDER — LORAZEPAM 2 MG/ML IJ SOLN
2.0000 mg | Freq: Once | INTRAMUSCULAR | Status: AC
  Administered 2020-08-29: 2 mg via INTRAVENOUS
  Filled 2020-08-25 (×2): qty 1

## 2020-08-25 MED ORDER — DONEPEZIL HCL 5 MG PO TABS
5.0000 mg | ORAL_TABLET | Freq: Every day | ORAL | Status: DC
  Administered 2020-08-25 – 2020-09-03 (×8): 5 mg
  Filled 2020-08-25 (×9): qty 1

## 2020-08-25 MED ORDER — INSULIN ASPART 100 UNIT/ML ~~LOC~~ SOLN
0.0000 [IU] | SUBCUTANEOUS | Status: DC
  Administered 2020-08-25 (×2): 3 [IU] via SUBCUTANEOUS
  Administered 2020-08-25 – 2020-08-26 (×2): 5 [IU] via SUBCUTANEOUS
  Administered 2020-08-26: 2 [IU] via SUBCUTANEOUS
  Administered 2020-08-26 (×3): 3 [IU] via SUBCUTANEOUS
  Administered 2020-08-26: 2 [IU] via SUBCUTANEOUS
  Administered 2020-08-27: 7 [IU] via SUBCUTANEOUS
  Administered 2020-08-27: 5 [IU] via SUBCUTANEOUS
  Administered 2020-08-27: 7 [IU] via SUBCUTANEOUS

## 2020-08-25 MED ORDER — POTASSIUM CHLORIDE 10 MEQ/100ML IV SOLN
10.0000 meq | INTRAVENOUS | Status: AC
  Administered 2020-08-25 (×4): 10 meq via INTRAVENOUS
  Filled 2020-08-25 (×4): qty 100

## 2020-08-25 NOTE — Progress Notes (Signed)
Inpatient Diabetes Program Recommendations  AACE/ADA: New Consensus Statement on Inpatient Glycemic Control   Target Ranges:  Prepandial:   less than 140 mg/dL      Peak postprandial:   less than 180 mg/dL (1-2 hours)      Critically ill patients:  140 - 180 mg/dL   Results for EVIN, CHIRCO (MRN [id number]) as of 08/25/2020 08:20  Ref. Range 08/24/2020 11:34 08/24/2020 16:59 08/24/2020 20:53 08/25/2020 04:45 08/25/2020 07:38  Glucose-Capillary Latest Ref Range: 70 - 99 mg/dL 233 (H) 007 (H) 622 (H) 316 (H) 285 (H)   Review of Glycemic Control  Diabetes history: DM2 Outpatient Diabetes medications: Lantus 20-30 units QHS, Metformin 500 mg BID, Glipizide 10 mg daily Current orders for Inpatient glycemic control: Novolog 0-9 units TID with meals  Inpatient Diabetes Program Recommendations:    Insulin: Please change CBGs and Novolog 0-9 units to Q4H. Please consider ordering Lantus 10 units Q24H. If glucose remains consistently over 180 mg/dl, please consider ordering Novolog 3 units Q4H for tube feeding coverage.   Thanks, Orlando Penner, RN, MSN, CDE Diabetes Coordinator Inpatient Diabetes Program [phone] (Team Pager from 8am to 5pm)

## 2020-08-25 NOTE — Progress Notes (Signed)
Notified Dr. Kerry Hough of EKG changes. Pt in afib with HR dropping down into the 40s. New orders received.

## 2020-08-25 NOTE — Consult Note (Signed)
NAME:  [name], MRN:  [id number], DOB:  25-Feb-1951, LOS: 1 ADMISSION DATE:  08/23/2020, CONSULTATION DATE:  08/24/2020 REFERRING MD:  Dr. Sedonia Small, ER, CHIEF COMPLAINT:  Short of breath   Brief History:  70 yo female brought to [facility] ER unresponsive after being d/c from [facility] on 08/22/20 for acute coronary syndrome.  Reported she fell at home.  Intubated in ER.  Found to have multilobar pneumonia and started on antibiotics and pressors.  Hx from chart and medical team.  Past Medical History:  Anemia, Barrett's esophagus, Systolic CHF, DM type 2, A fib, HLD, HTN, Seizures, Hypothyroidism  Significant Hospital Events:  2/20 Admit  Consults:  PCCM  2/21  Procedures:  ETT 2/20 >> Rt IJ CVL 2/21 >>   Significant Diagnostic Tests:  CT head 2/21 >> mild atrophy, chronic microvascular disease CT chest 2/21 >> b/l ASD, small b/l effusions  Micro Data:  COVID 2/20 >> negative Flu PCR 2/20 >> negative MRSA PCR 2/20 >> negative ET 2/22 >>>  Antimicrobials:  Vancomycin 2/20 >> 2/21  Cefepime 2/20 >>    Scheduled Meds: . amiodarone  200 mg Per Tube q morning  . apixaban  5 mg Per NG tube BID  . Chlorhexidine Gluconate Cloth  6 each Topical Daily  . docusate  100 mg Per Tube BID  . donepezil  5 mg Per Tube QHS  . feeding supplement (PROSource TF)  45 mL Per Tube TID  . feeding supplement (VITAL HIGH PROTEIN)  1,000 mL Per Tube Q24H  . insulin aspart  0-9 Units Subcutaneous Q4H  . insulin glargine  10 Units Subcutaneous Daily  . levETIRAcetam  1,500 mg Per NG tube BID  . levothyroxine  175 mcg Per NG tube Daily  . pantoprazole sodium  40 mg Per Tube Q24H  . polyethylene glycol  17 g Per Tube Daily   Continuous Infusions: . sodium chloride 50 mL/hr at 08/25/20 1303  . ceFEPime (MAXIPIME) IV Stopped (08/25/20 0924)  . norepinephrine (LEVOPHED) Adult infusion 18 mcg/min (08/25/20 1303)  . propofol (DIPRIVAN) infusion 45 mcg/kg/min (08/25/20 1303)  . sodium chloride Stopped  (08/24/20 0107)   PRN Meds:.acetaminophen **OR** acetaminophen, fentaNYL (SUBLIMAZE) injection, ipratropium-albuterol, ondansetron **OR** ondansetron (ZOFRAN) IV  Interim History / Subjective:  Sedated and not breathing over back up rate/ still on levophed  Objective   Blood pressure (!) 113/96, pulse 91, temperature (!) 101.48 F (38.6 C), temperature source Bladder, resp. rate (!) 21, height '5\' 6"'  (1.676 m), weight 104 kg, SpO2 92 %.    Vent Mode: PRVC FiO2 (%):  [40 %-90 %] 40 % Set Rate:  [24 bmp-28 bmp] 24 bmp Vt Set:  [480 mL] 480 mL PEEP:  [5 cmH20-10 cmH20] 5 cmH20 Plateau Pressure:  [24 cmH20-25 cmH20] 24 cmH20   Intake/Output Summary (Last 24 hours) at 08/25/2020 0920 Last data filed at 08/25/2020 6644 Gross per 24 hour  Intake 3933.73 ml  Output 1850 ml  Net 2083.73 ml   Filed Weights   08/24/20 0139 08/24/20 0156  Weight: 104 kg 104 kg    Examination:  General  Tmax   101.7  Pt sedated on vent/ not breathing over back up rate  No jvd Oropharynx et/ og  Neck supple Lungs with distant bs bilaterally RRR no s3 or or sign murmur Abd obese with limited excursion  Extr warm with trace edema or clubbing noted   I personally reviewed images and agree with radiology impression as follows:  CXR:  2/22 portable 1. Endotracheal tube, NG tube in stable position. Right IJ line in unchanged position with tip in the right atrium 2. Cardiomegaly with bilateral interstitial prominence suggesting interstitial edema. 3.  Left base atelectasis/infiltrate.    Resolved Hospital Problem list     Assessment & Plan:   Acute hypoxic respiratory failure from bacterial HCAP. - adjust PEEP/FiO2 to keep SpO2 90 to 95% - see micro flowsheet     Septic shock from pneumonia. - pressors to keep MAP > 65 - see micro flowsheet   Acute metabolic encephalopathy from sepsis and hypoxia. Hx of seizures, neuropathy. - RASS goal -1 to -2 - continue keppra - holding outpt  aricept, cymbalta, neurontin, ativan  AKI from ATN 2nd to sepsis. CKD 3a. Lab Results  Component Value Date   CREATININE 1.56 (H) 08/25/2020   CREATININE 2.00 (H) 08/24/2020   CREATININE 2.15 (H) 08/23/2020   -baseline creatinine 1.40 from 04/08/20 - f/u BMET, monitor urine outpt  Chronic a fib, chronic diastolic CHF, HLD. - continue eliquis  - hold outpt torsemide - may need to reconsider amiodarone in this setting  (hard excluded element of toxicity) > check esr baseline though admittedly non-specific  DM type 2 poorly controlled with hyperglycemia. Hx of hypothyroidism. - SSI - continue levothyroxine - hold outpt glucotrol, metformin  Anemia of chronic disease and critical illness. Lab Results  Component Value Date   HGB 9.2 (L) 08/25/2020   HGB 10.4 (L) 08/23/2020   HGB 12.3 04/08/2020   - f/u CBC - transfuse for Hb < 7 or significant bleeding  Best practice (evaluated daily)  Diet: tube feeds DVT prophylaxis: eliquis GI prophylaxis: protonix Mobility: bed rest Disposition: ICU Code Status: full code  Daughter [name] at bedside am 2/22 updated  Labs    CMP Latest Ref Rng & Units 08/25/2020 08/24/2020 08/23/2020  Glucose 70 - 99 mg/dL 320(H) 248(H) 215(H)  BUN 8 - 23 mg/dL 37(H) 39(H) 39(H)  Creatinine 0.44 - 1.00 mg/dL 1.56(H) 2.00(H) 2.15(H)  Sodium 135 - 145 mmol/L 142 144 142  Potassium 3.5 - 5.1 mmol/L 3.4(L) 3.9 4.8  Chloride 98 - 111 mmol/L 100 98 100  CO2 22 - 32 mmol/L 30 30 34(H)  Calcium 8.9 - 10.3 mg/dL 8.6(L) 9.0 8.9  Total Protein 6.5 - 8.1 g/dL - - 6.6  Total Bilirubin 0.3 - 1.2 mg/dL - - 0.5  Alkaline Phos 38 - 126 U/L - - 71  AST 15 - 41 U/L - - 31  ALT 0 - 44 U/L - - 28    CBC Latest Ref Rng & Units 08/25/2020 08/23/2020 04/08/2020  WBC 4.0 - 10.5 K/uL 13.0(H) 11.4(H) 8.2  Hemoglobin 12.0 - 15.0 g/dL 9.2(L) 10.4(L) 12.3  Hematocrit 36.0 - 46.0 % 29.2(L) 36.4 37.3  Platelets 150 - 400 K/uL 191 234 193    ABG    Component Value  Date/Time   PHART 7.502 (H) 08/25/2020 0420   PCO2ART 40.2 08/25/2020 0420   PO2ART 112 (H) 08/25/2020 0420   HCO3 31.4 (H) 08/25/2020 0420   TCO2 27 01/26/2015 1132   ACIDBASEDEF 1.0 01/26/2015 1132   O2SAT 98.2 08/25/2020 0420    CBG (last 3)  Recent Labs    08/24/20 2053 08/25/20 0445 08/25/20 0738  GLUCAP 220* 316* 285*     The patient is critically ill with multiple organ systems failure and requires high complexity decision making for assessment and support, frequent evaluation and titration of therapies, application of advanced monitoring technologies and  extensive interpretation of multiple databases. Critical Care Time devoted to patient care services described in this note is  35 minutes.   Christinia Gully, MD Pulmonary and [place] [phone]   After 7:00 pm call Elink  [phone]

## 2020-08-25 NOTE — Progress Notes (Signed)
Notified Dr. Kerry Hough, pt temp 101.5 after tylenol. Order received for cooling blanket.

## 2020-08-25 NOTE — Progress Notes (Signed)
Sputum collected and sent to lab 

## 2020-08-25 NOTE — Progress Notes (Signed)
PROGRESS NOTE    [name]  ZOX:[id number] DOB: Aug 05, 1950 DOA: 08/23/2020 PCP: Gareth Morgan, MD    Brief Narrative:  70 year old female admitted to the hospital with acute respiratory failure secondary to possible pneumonia.  Patient was intubated in the emergency room.  She was noted to be in septic shock and was started on vasopressors and antibiotics.  History is further complicated by recent diagnosis of cardiomyopathy and ejection fraction 15 to 20%.  She also has atrial fibrillation on amiodarone and Eliquis.  PCCM following.   Assessment & Plan:   Principal Problem:   Lobar pneumonia (HCC) Active Problems:   Hypothyroidism   Hyperlipidemia   Essential hypertension   Atrial fibrillation (HCC)   Congestive heart failure (HCC)   Gastroparesis   Seizure (HCC)   Dementia (HCC)   DM type 2 (diabetes mellitus, type 2) (HCC)   Respiratory failure with hypoxia (HCC)   Fibromyalgia   Septic shock (HCC)   Acute respiratory failure with hypoxia and hypercapnia -Secondary to possible pneumonia -Patient was intubated on 2/20 and is on mechanical ventilation -Appreciate PCCM assistance -Other etiologies to be considered as amiodarone toxicity  Septic shock -Secondary to possible pneumonia -MRSA PCR is negative, vancomycin discontinued -Unfortunately, she is continued to have fevers -Blood cultures have been sent -Continue on cefepime -Wean off Levophed as tolerated  Acute systolic congestive heart failure -Recent echocardiogram at [facility] showed ejection fraction of 15 to 20% -No evidence of ischemic cardiomyopathy based on heart catheterization on 2/15 at [facility] -She was discharged from [facility] on torsemide 100 mg twice daily -Currently, CVP is low and blood pressures of also been low, will hold off on resuming diuretics at this time.  Bradycardia -Patient noted to have episodes of bradycardia with pauses -EKG shows prolonged QT interval, and diffuse T wave inversions -We  will hold off on further amiodarone -Check troponins, repeat echocardiogram -Replace electrolytes -May need to have cardiology input if symptoms persist -If heart rate sustains bradycardic, may need to consider continuing on low-dose dobutamine/Levophed.  Acute kidney injury on chronic kidney disease stage IIIa -Baseline creatinine approximately 1.4 -Overall creatinine trending down -Continue to monitor  Chronic atrial fibrillation -Anticoagulated with Eliquis -She is currently on amiodarone -She is having episodes of bradycardia, will hold off on further amnio for now  Diabetes -Blood sugars elevated -Started on Lantus -Continue on sliding scale  Hypothyroidism -Continue on Synthroid  Anemia of chronic disease and critical illness -Continue to follow CBC -Transfuse for hemoglobin less than 7 or if she has significant bleeding  History of seizures -Continue on Keppra  Dementia -Continue Aricept     DVT prophylaxis: SCDs Start: 08/24/20 0945 apixaban (ELIQUIS) tablet 5 mg  Code Status: Full code Family Communication: Discussed with daughter at the bedside Disposition Plan: Status is: Inpatient  Remains inpatient appropriate because:Inpatient level of care appropriate due to severity of illness   Dispo: The patient is from: Home              Anticipated d/c is to: TBD              Anticipated d/c date is: > 3 days              Patient currently is not medically stable to d/c.   Difficult to place patient No         Consultants:   PCCM  Procedures:   ETT 2/20 >  Right IJ central line 2/21 >  Antimicrobials:   Cefepime  2/21 >   Subjective: Intubated and sedated  Objective: Vitals:   08/25/20 1645 08/25/20 1700 08/25/20 1715 08/25/20 1730  BP: (!) 128/47 (!) 124/43 (!) 120/38 (!) 112/43  Pulse: 62 63 60 (!) 59  Resp: 20 20 20 20   Temp:      TempSrc:      SpO2: 100% 100% 100% 100%  Weight:      Height:        Intake/Output Summary  (Last 24 hours) at 08/25/2020 1747 Last data filed at 08/25/2020 1617 Gross per 24 hour  Intake 4209.94 ml  Output 3900 ml  Net 309.94 ml   Filed Weights   08/24/20 0139 08/24/20 0156  Weight: 104 kg 104 kg    Examination:  General exam: intubated and sedated  Respiratory system: Coarse breath sounds bilaterally. Respiratory effort normal. Cardiovascular system: S1 & S2 heard, irregular. No JVD, murmurs, rubs, gallops or clicks.  1+ pedal edema. Gastrointestinal system: Abdomen is nondistended, soft and nontender. No organomegaly or masses felt. Normal bowel sounds heard. Central nervous system: Limited exam due to sedation Extremities: Symmetric  Skin: No rashes, lesions or ulcers Psychiatry: Unable to assess since she is sedated    Data Reviewed: I have personally reviewed following labs and imaging studies  CBC: Recent Labs  Lab 08/23/20 2305 08/25/20 0726  WBC 11.4* 13.0*  HGB 10.4* 9.2*  HCT 36.4 29.2*  MCV 115.2* 102.8*  PLT 234 191   Basic Metabolic Panel: Recent Labs  Lab 08/23/20 2305 08/24/20 1002 08/25/20 0425  NA 142 144 142  K 4.8 3.9 3.4*  CL 100 98 100  CO2 34* 30 30  GLUCOSE 215* 248* 320*  BUN 39* 39* 37*  CREATININE 2.15* 2.00* 1.56*  CALCIUM 8.9 9.0 8.6*  MG  --   --  2.4  PHOS  --   --  2.8   GFR: Estimated Creatinine Clearance: 40.3 mL/min (A) (by C-G formula based on SCr of 1.56 mg/dL (H)). Liver Function Tests: Recent Labs  Lab 08/23/20 2305  AST 31  ALT 28  ALKPHOS 71  BILITOT 0.5  PROT 6.6  ALBUMIN 3.4*   No results for input(s): LIPASE, AMYLASE in the last 168 hours. No results for input(s): AMMONIA in the last 168 hours. Coagulation Profile: Recent Labs  Lab 08/23/20 2305  INR 1.8*   Cardiac Enzymes: Recent Labs  Lab 08/23/20 2305  CKTOTAL 49   BNP (last 3 results) No results for input(s): PROBNP in the last 8760 hours. HbA1C: Recent Labs    08/24/20 1002  HGBA1C 7.9*   CBG: Recent Labs  Lab  08/24/20 2053 08/25/20 0445 08/25/20 0738 08/25/20 1132 08/25/20 1546  GLUCAP 220* 316* 285* 261* 209*   Lipid Profile: Recent Labs    08/25/20 0425  TRIG 120   Thyroid Function Tests: No results for input(s): TSH, T4TOTAL, FREET4, T3FREE, THYROIDAB in the last 72 hours. Anemia Panel: No results for input(s): VITAMINB12, FOLATE, FERRITIN, TIBC, IRON, RETICCTPCT in the last 72 hours. Sepsis Labs: Recent Labs  Lab 08/23/20 2347  LATICACIDVEN 1.2    Recent Results (from the past 240 hour(s))  Resp Panel by RT-PCR (Flu A&B, Covid) Nasopharyngeal Swab     Status: None   Collection Time: 08/23/20 11:45 PM   Specimen: Nasopharyngeal Swab; Nasopharyngeal(NP) swabs in vial transport medium  Result Value Ref Range Status   SARS Coronavirus 2 by RT PCR NEGATIVE NEGATIVE Final    Comment: (NOTE) SARS-CoV-2 target nucleic acids are NOT DETECTED.  The SARS-CoV-2 RNA is generally detectable in upper respiratory specimens during the acute phase of infection. The lowest concentration of SARS-CoV-2 viral copies this assay can detect is 138 copies/mL. A negative result does not preclude SARS-Cov-2 infection and should not be used as the sole basis for treatment or other patient management decisions. A negative result may occur with  improper specimen collection/handling, submission of specimen other than nasopharyngeal swab, presence of viral mutation(s) within the areas targeted by this assay, and inadequate number of viral copies(<138 copies/mL). A negative result must be combined with clinical observations, patient history, and epidemiological information. The expected result is Negative.  Fact Sheet for Patients:  BloggerCourse.com  Fact Sheet for Healthcare Providers:  SeriousBroker.it  This test is no t yet approved or cleared by the [place] FDA and  has been authorized for detection and/or diagnosis of SARS-CoV-2 by FDA  under an Emergency Use Authorization (EUA). This EUA will remain  in effect (meaning this test can be used) for the duration of the COVID-19 declaration under Section 564(b)(1) of the Act, 21 U.S.C.section 360bbb-3(b)(1), unless the authorization is terminated  or revoked sooner.       Influenza A by PCR NEGATIVE NEGATIVE Final   Influenza B by PCR NEGATIVE NEGATIVE Final    Comment: (NOTE) The Xpert Xpress SARS-CoV-2/FLU/RSV plus assay is intended as an aid in the diagnosis of influenza from Nasopharyngeal swab specimens and should not be used as a sole basis for treatment. Nasal washings and aspirates are unacceptable for Xpert Xpress SARS-CoV-2/FLU/RSV testing.  Fact Sheet for Patients: BloggerCourse.com  Fact Sheet for Healthcare Providers: SeriousBroker.it  This test is not yet approved or cleared by the [place] FDA and has been authorized for detection and/or diagnosis of SARS-CoV-2 by FDA under an Emergency Use Authorization (EUA). This EUA will remain in effect (meaning this test can be used) for the duration of the COVID-19 declaration under Section 564(b)(1) of the Act, 21 U.S.C. section 360bbb-3(b)(1), unless the authorization is terminated or revoked.  Performed at [facility], [address], [place], [place] [address]   MRSA PCR Screening     Status: None   Collection Time: 08/24/20  9:57 AM   Specimen: Nasal Mucosa; Nasopharyngeal  Result Value Ref Range Status   MRSA by PCR NEGATIVE NEGATIVE Final    Comment:        The GeneXpert MRSA Assay (FDA approved for NASAL specimens only), is one component of a comprehensive MRSA colonization surveillance program. It is not intended to diagnose MRSA infection nor to guide or monitor treatment for MRSA infections. Performed at [facility], [address], [place], [place] [address]          Radiology Studies: CT ABDOMEN PELVIS WO CONTRAST  Result  Date: 08/24/2020 CLINICAL DATA:  Status post fall, unresponsive. EXAM: CT CHEST, ABDOMEN AND PELVIS WITHOUT CONTRAST TECHNIQUE: Multidetector CT imaging of the chest, abdomen and pelvis was performed following the standard protocol without IV contrast. COMPARISON:  None. FINDINGS: CT CHEST FINDINGS Cardiovascular: A right internal jugular venous catheter is in place. There is moderate severity calcification of the aortic arch. There is moderate severity cardiomegaly. No pericardial effusion. Mediastinum/Nodes: No enlarged mediastinal, hilar, or axillary lymph nodes. Thyroid gland, trachea, and esophagus demonstrate no significant findings. Lungs/Pleura: Endotracheal and nasogastric tubes are in place. Moderate severity left upper lobe and bilateral lower lobe atelectasis and/or infiltrate is noted. Small bilateral pleural effusions are noted, right greater than left. No pneumothorax is identified. Musculoskeletal:  There is evidence of prior vertebroplasty at the level of T12. A chronic fracture deformity of the sternum is seen. Multilevel degenerative changes are noted throughout the thoracic spine. CT ABDOMEN PELVIS FINDINGS Hepatobiliary: No focal liver abnormality is seen. Status post cholecystectomy. No biliary dilatation. Pancreas: Unremarkable. No pancreatic ductal dilatation or surrounding inflammatory changes. Spleen: Normal in size without focal abnormality. Adrenals/Urinary Tract: Adrenal glands are unremarkable. Kidneys are normal, without renal calculi, focal lesion, or hydronephrosis. A Foley catheter is seen within the urinary bladder. Stomach/Bowel: A nasogastric tube is seen with its distal tip noted within the proximal duodenum. The stomach is otherwise within normal limits. The appendix is not clearly visualized. No evidence of bowel wall thickening, distention, or inflammatory changes. Noninflamed diverticula are seen throughout the sigmoid colon. Vascular/Lymphatic: Aortic atherosclerosis. No  enlarged abdominal or pelvic lymph nodes. Reproductive: Status post hysterectomy. No adnexal masses. Other: There is a 2.5 cm x 4.5 cm fat containing umbilical hernia. No abdominopelvic ascites. Musculoskeletal: Multilevel degenerative changes seen throughout the lumbar spine. IMPRESSION: 1. Moderate severity left upper lobe and bilateral lower lobe atelectasis and/or infiltrate. 2. Small bilateral pleural effusions, right greater than left. 3. Moderate severity cardiomegaly. 4. Evidence of prior vertebroplasty at the level of T12. 5. Sigmoid diverticulosis. 6. Fat-containing umbilical hernia. 7. Aortic atherosclerosis. Aortic Atherosclerosis (ICD10-I70.0). Electronically Signed   By: Aram Candelahaddeus  Houston M.D.   On: 08/24/2020 00:39   CT HEAD WO CONTRAST  Result Date: 08/24/2020 CLINICAL DATA:  Status post fall, unresponsive. EXAM: CT HEAD WITHOUT CONTRAST TECHNIQUE: Contiguous axial images were obtained from the base of the skull through the vertex without intravenous contrast. COMPARISON:  August 15, 2020 FINDINGS: Brain: There is mild cerebral atrophy with widening of the extra-axial spaces and ventricular dilatation. There are areas of decreased attenuation within the white matter tracts of the supratentorial brain, consistent with microvascular disease changes. Vascular: No hyperdense vessel or unexpected calcification. Skull: Normal. Negative for fracture or focal lesion. Sinuses/Orbits: No acute finding. Other: Endotracheal and orogastric tubes are in place. IMPRESSION: No acute intracranial pathology. Electronically Signed   By: Aram Candelahaddeus  Houston M.D.   On: 08/24/2020 00:30   CT Chest Wo Contrast  Result Date: 08/24/2020 CLINICAL DATA:  Status post fall, unresponsive. EXAM: CT CHEST, ABDOMEN AND PELVIS WITHOUT CONTRAST TECHNIQUE: Multidetector CT imaging of the chest, abdomen and pelvis was performed following the standard protocol without IV contrast. COMPARISON:  None. FINDINGS: CT CHEST FINDINGS  Cardiovascular: A right internal jugular venous catheter is in place. There is moderate severity calcification of the aortic arch. There is moderate severity cardiomegaly. No pericardial effusion. Mediastinum/Nodes: No enlarged mediastinal, hilar, or axillary lymph nodes. Thyroid gland, trachea, and esophagus demonstrate no significant findings. Lungs/Pleura: Endotracheal and nasogastric tubes are in place. Moderate severity left upper lobe and bilateral lower lobe atelectasis and/or infiltrate is noted. Small bilateral pleural effusions are noted, right greater than left. No pneumothorax is identified. Musculoskeletal: There is evidence of prior vertebroplasty at the level of T12. A chronic fracture deformity of the sternum is seen. Multilevel degenerative changes are noted throughout the thoracic spine. CT ABDOMEN PELVIS FINDINGS Hepatobiliary: No focal liver abnormality is seen. Status post cholecystectomy. No biliary dilatation. Pancreas: Unremarkable. No pancreatic ductal dilatation or surrounding inflammatory changes. Spleen: Normal in size without focal abnormality. Adrenals/Urinary Tract: Adrenal glands are unremarkable. Kidneys are normal, without renal calculi, focal lesion, or hydronephrosis. A Foley catheter is seen within the urinary bladder. Stomach/Bowel: A nasogastric tube is seen with its distal tip  noted within the proximal duodenum. The stomach is otherwise within normal limits. The appendix is not clearly visualized. No evidence of bowel wall thickening, distention, or inflammatory changes. Noninflamed diverticula are seen throughout the sigmoid colon. Vascular/Lymphatic: Aortic atherosclerosis. No enlarged abdominal or pelvic lymph nodes. Reproductive: Status post hysterectomy. No adnexal masses. Other: There is a 2.5 cm x 4.5 cm fat containing umbilical hernia. No abdominopelvic ascites. Musculoskeletal: Multilevel degenerative changes seen throughout the lumbar spine. IMPRESSION: 1. Moderate  severity left upper lobe and bilateral lower lobe atelectasis and/or infiltrate. 2. Small bilateral pleural effusions, right greater than left. 3. Moderate severity cardiomegaly. 4. Evidence of prior vertebroplasty at the level of T12. 5. Sigmoid diverticulosis. 6. Fat-containing umbilical hernia. 7. Aortic atherosclerosis. Aortic Atherosclerosis (ICD10-I70.0). Electronically Signed   By: Aram Candela M.D.   On: 08/24/2020 00:40   CT CERVICAL SPINE WO CONTRAST  Result Date: 08/24/2020 CLINICAL DATA:  Status post fall, unresponsive. EXAM: CT CERVICAL SPINE WITHOUT CONTRAST TECHNIQUE: Multidetector CT imaging of the cervical spine was performed without intravenous contrast. Multiplanar CT image reconstructions were also generated. COMPARISON:  October 04, 2012 FINDINGS: Alignment: Normal. Skull base and vertebrae: No acute fracture. No primary bone lesion or focal pathologic process. Soft tissues and spinal canal: No prevertebral fluid or swelling. No visible canal hematoma. Disc levels: Marked severity endplate sclerosis and osteophyte formation is seen at the levels of C3-C4, C4-C5, C5-C6 and C6-C7. There is marked severity narrowing of the anterior atlantoaxial articulation. Mild to moderate severity intervertebral disc space narrowing is seen at the levels of C5-C6 and C6-C7. Moderate severity bilateral multilevel facet joint hypertrophy is noted. Upper chest: Negative. Other: Endotracheal and orogastric tubes are noted. IMPRESSION: 1. No acute osseous abnormality. 2. Marked severity multilevel degenerative changes, most prominent at the levels of C3-C4, C4-C5, C5-C6 and C6-C7. Electronically Signed   By: Aram Candela M.D.   On: 08/24/2020 00:32   DG Chest Port 1 View  Result Date: 08/25/2020 CLINICAL DATA:  Respiratory failure. EXAM: PORTABLE CHEST 1 VIEW COMPARISON:  Chest x-ray 08/23/2020. FINDINGS: Endotracheal tube, NG tube in stable position. Right IJ line in unchanged position with tip in the  right atrium. Cardiomegaly with bilateral interstitial prominence suggesting interstitial edema. Left base atelectasis/infiltrate. No prominent pleural effusion. No pneumothorax. IMPRESSION: 1. Endotracheal tube, NG tube in stable position. Right IJ line in unchanged position with tip in the right atrium 2. Cardiomegaly with bilateral interstitial prominence suggesting interstitial edema. 3.  Left base atelectasis/infiltrate. Electronically Signed   By: Maisie Fus  Register   On: 08/25/2020 05:27   DG Chest Portable 1 View  Result Date: 08/23/2020 CLINICAL DATA:  Status post intubation. EXAM: PORTABLE CHEST 1 VIEW COMPARISON:  August 17, 2020 FINDINGS: An endotracheal tube is seen with its distal tip approximately 2.1 cm from the carina. A nasogastric tube is noted with its distal end seen within the expected region of the gastric antrum. There is a right internal jugular venous catheter. Its distal tip is seen within the right atrium, approximately 5.2 cm distal to the junction of the superior vena cava and right atrium. Mild atelectasis and/or infiltrate is noted within the right lung base. There is no evidence of a pleural effusion or pneumothorax. The cardiac silhouette is markedly enlarged. The visualized skeletal structures are unremarkable. IMPRESSION: 1. Interval endotracheal tube, nasogastric tube and right internal jugular venous catheter placement and positioning, as described above. 2. Mild right basilar atelectasis and/or infiltrate. Electronically Signed   By: Demetrius Revel.D.  On: 08/23/2020 23:56        Scheduled Meds: . apixaban  5 mg Per NG tube BID  . Chlorhexidine Gluconate Cloth  6 each Topical Daily  . docusate  100 mg Per Tube BID  . donepezil  5 mg Per Tube QHS  . feeding supplement (PROSource TF)  45 mL Per Tube TID  . feeding supplement (VITAL HIGH PROTEIN)  1,000 mL Per Tube Q24H  . insulin aspart  0-9 Units Subcutaneous Q4H  . insulin glargine  10 Units  Subcutaneous Daily  . levETIRAcetam  1,500 mg Per NG tube BID  . levothyroxine  175 mcg Per NG tube Daily  . pantoprazole sodium  40 mg Per Tube Q24H  . polyethylene glycol  17 g Per Tube Daily   Continuous Infusions: . sodium chloride 50 mL/hr at 08/25/20 1606  . ceFEPime (MAXIPIME) IV Stopped (08/25/20 0924)  . norepinephrine (LEVOPHED) Adult infusion 14 mcg/min (08/25/20 1606)  . potassium chloride 10 mEq (08/25/20 1718)  . propofol (DIPRIVAN) infusion 45 mcg/kg/min (08/25/20 1606)  . sodium chloride Stopped (08/24/20 0107)     LOS: 1 day    Critical care procedure note Authorized and performed by: Erick Blinks Total critical care time: Approximately 30 minutes Due to high probability of clinically significant, life-threatening deterioration, the patient required my highest level of preparedness to intervene emergently and I personally spent this critical care time directly and personally managing the patient.  The critical care time included obtaining a history, examining the patient, pulse oximetry, ordering and review of studies, arranging urgent treatment with development of a management plan, evaluation of patient's response to treatment, frequent reassessment, discussions with other providers.  Critical care time was performed to assess and manage the high probability of imminent, life-threatening deterioration that could result in multiorgan failure.  It was exclusive of separate billable procedures and treating other patients and teaching time.  Please see MDM section and the rest of the of note for further information on patient assessment and treatment     Erick Blinks, MD [facility]   If 7PM-7AM, please contact night-coverage [email or web address]  08/25/2020, 5:47 PM

## 2020-08-26 ENCOUNTER — Inpatient Hospital Stay (HOSPITAL_COMMUNITY): Payer: PPO

## 2020-08-26 ENCOUNTER — Other Ambulatory Visit: Payer: Self-pay

## 2020-08-26 DIAGNOSIS — I5022 Chronic systolic (congestive) heart failure: Secondary | ICD-10-CM

## 2020-08-26 DIAGNOSIS — F0390 Unspecified dementia without behavioral disturbance: Secondary | ICD-10-CM | POA: Diagnosis not present

## 2020-08-26 DIAGNOSIS — I482 Chronic atrial fibrillation, unspecified: Secondary | ICD-10-CM

## 2020-08-26 DIAGNOSIS — J9601 Acute respiratory failure with hypoxia: Secondary | ICD-10-CM | POA: Diagnosis not present

## 2020-08-26 DIAGNOSIS — J181 Lobar pneumonia, unspecified organism: Secondary | ICD-10-CM | POA: Diagnosis not present

## 2020-08-26 DIAGNOSIS — I10 Essential (primary) hypertension: Secondary | ICD-10-CM | POA: Diagnosis not present

## 2020-08-26 LAB — CBC
HCT: 28.1 % — ABNORMAL LOW (ref 36.0–46.0)
Hemoglobin: 8.8 g/dL — ABNORMAL LOW (ref 12.0–15.0)
MCH: 32.5 pg (ref 26.0–34.0)
MCHC: 31.3 g/dL (ref 30.0–36.0)
MCV: 103.7 fL — ABNORMAL HIGH (ref 80.0–100.0)
Platelets: 168 10*3/uL (ref 150–400)
RBC: 2.71 MIL/uL — ABNORMAL LOW (ref 3.87–5.11)
RDW: 15.1 % (ref 11.5–15.5)
WBC: 11.2 10*3/uL — ABNORMAL HIGH (ref 4.0–10.5)
nRBC: 0.3 % — ABNORMAL HIGH (ref 0.0–0.2)

## 2020-08-26 LAB — BLOOD GAS, ARTERIAL
Acid-Base Excess: 4.8 mmol/L — ABNORMAL HIGH (ref 0.0–2.0)
Bicarbonate: 29.1 mmol/L — ABNORMAL HIGH (ref 20.0–28.0)
Drawn by: 21310
FIO2: 40
MECHVT: 480 mL
O2 Saturation: 99.3 %
PEEP: 10 cmH2O
Patient temperature: 36.3
RATE: 20 resp/min
pCO2 arterial: 31.9 mmHg — ABNORMAL LOW (ref 32.0–48.0)
pH, Arterial: 7.544 — ABNORMAL HIGH (ref 7.350–7.450)
pO2, Arterial: 157 mmHg — ABNORMAL HIGH (ref 83.0–108.0)

## 2020-08-26 LAB — GLUCOSE, CAPILLARY
Glucose-Capillary: 169 mg/dL — ABNORMAL HIGH (ref 70–99)
Glucose-Capillary: 173 mg/dL — ABNORMAL HIGH (ref 70–99)
Glucose-Capillary: 202 mg/dL — ABNORMAL HIGH (ref 70–99)
Glucose-Capillary: 215 mg/dL — ABNORMAL HIGH (ref 70–99)
Glucose-Capillary: 242 mg/dL — ABNORMAL HIGH (ref 70–99)
Glucose-Capillary: 261 mg/dL — ABNORMAL HIGH (ref 70–99)
Glucose-Capillary: 337 mg/dL — ABNORMAL HIGH (ref 70–99)

## 2020-08-26 LAB — COMPREHENSIVE METABOLIC PANEL
ALT: 20 U/L (ref 0–44)
AST: 19 U/L (ref 15–41)
Albumin: 2.4 g/dL — ABNORMAL LOW (ref 3.5–5.0)
Alkaline Phosphatase: 56 U/L (ref 38–126)
Anion gap: 11 (ref 5–15)
BUN: 28 mg/dL — ABNORMAL HIGH (ref 8–23)
CO2: 27 mmol/L (ref 22–32)
Calcium: 8.7 mg/dL — ABNORMAL LOW (ref 8.9–10.3)
Chloride: 109 mmol/L (ref 98–111)
Creatinine, Ser: 0.96 mg/dL (ref 0.44–1.00)
GFR, Estimated: >60 mL/min (ref >60)
Glucose, Bld: 170 mg/dL — ABNORMAL HIGH (ref 70–99)
Potassium: 3.2 mmol/L — ABNORMAL LOW (ref 3.5–5.1)
Sodium: 147 mmol/L — ABNORMAL HIGH (ref 135–145)
Total Bilirubin: 0.8 mg/dL (ref 0.3–1.2)
Total Protein: 5.8 g/dL — ABNORMAL LOW (ref 6.5–8.1)

## 2020-08-26 LAB — TSH: TSH: 0.638 u[IU]/mL (ref 0.350–4.500)

## 2020-08-26 LAB — TROPONIN I (HIGH SENSITIVITY): Troponin I (High Sensitivity): 170 ng/L (ref <18)

## 2020-08-26 LAB — STREP PNEUMONIAE URINARY ANTIGEN: Strep Pneumo Urinary Antigen: NEGATIVE

## 2020-08-26 LAB — CORTISOL: Cortisol, Plasma: 25.1 ug/dL

## 2020-08-26 MED ORDER — METHYLPREDNISOLONE SODIUM SUCC 125 MG IJ SOLR
80.0000 mg | Freq: Two times a day (BID) | INTRAMUSCULAR | Status: DC
  Administered 2020-08-26 – 2020-08-28 (×4): 80 mg via INTRAVENOUS
  Filled 2020-08-26 (×4): qty 2

## 2020-08-26 MED ORDER — CHLORHEXIDINE GLUCONATE 0.12% ORAL RINSE (MEDLINE KIT)
15.0000 mL | Freq: Two times a day (BID) | OROMUCOSAL | Status: DC
  Administered 2020-08-26 – 2020-08-28 (×4): 15 mL via OROMUCOSAL

## 2020-08-26 MED ORDER — SODIUM CHLORIDE 0.9 % IV SOLN
2.0000 g | Freq: Three times a day (TID) | INTRAVENOUS | Status: DC
  Administered 2020-08-26 – 2020-08-27 (×3): 2 g via INTRAVENOUS
  Filled 2020-08-26 (×3): qty 2

## 2020-08-26 MED ORDER — DICYCLOMINE HCL 20 MG PO TABS: ORAL_TABLET | ORAL | 5 refills | Status: DC

## 2020-08-26 MED ORDER — ORAL CARE MOUTH RINSE
15.0000 mL | OROMUCOSAL | Status: DC
  Administered 2020-08-26 – 2020-08-28 (×18): 15 mL via OROMUCOSAL

## 2020-08-26 MED ORDER — SODIUM CHLORIDE 0.45 % IV SOLN: INTRAVENOUS | Status: DC

## 2020-08-26 NOTE — Consult Note (Addendum)
NAME:  [name], MRN:  [id number], DOB:  October 03, 1950, LOS: 2 ADMISSION DATE:  08/23/2020, CONSULTATION DATE:  08/24/2020 REFERRING MD:  Dr. Sedonia Small, ER, CHIEF COMPLAINT:  Short of breath   Brief History:  70 yo female brought to [facility] ER unresponsive after being d/c from [facility] on 08/22/20 for acute coronary syndrome.  Reported she fell at home.  Intubated in ER.  Found to have multilobar pneumonia and started on antibiotics and pressors.  Hx from chart and medical team.  Past Medical History:  Anemia, Barrett's esophagus, Systolic CHF, DM type 2, A fib, HLD, HTN, Seizures, Hypothyroidism  Significant Hospital Events:  2/20 Admit 2/22 stop amiodarone (esr 95) 2/23  Solumedrol trial     Consults:  PCCM  2/21  Procedures:  ETT 2/20 >> Rt IJ CVL 2/21 >>   Significant Diagnostic Tests:  CT head 2/21 >> mild atrophy, chronic microvascular disease CT chest 2/21 >> b/l ASD, small b/l effusions  Micro Data:  COVID 2/20 >> negative Flu PCR 2/20 >> negative MRSA PCR 2/20 >> negative ET 2/22 >  No wbc, few gpc >>> BC x 2 2/22 (while on cefepime)  >>> Urine strep 2/22 >> Urine legionella 2/22 >>>  Antimicrobials:  Vancomycin 2/20  Only  Cefepime 2/20 >>    Scheduled Meds: . apixaban  5 mg Per NG tube BID  . Chlorhexidine Gluconate Cloth  6 each Topical Daily  . docusate  100 mg Per Tube BID  . donepezil  5 mg Per Tube QHS  . feeding supplement (PROSource TF)  45 mL Per Tube TID  . feeding supplement (VITAL HIGH PROTEIN)  1,000 mL Per Tube Q24H  . insulin aspart  0-9 Units Subcutaneous Q4H  . insulin glargine  10 Units Subcutaneous Daily  . levETIRAcetam  1,500 mg Per NG tube BID  . levothyroxine  175 mcg Per NG tube Daily  . LORazepam  2 mg Intravenous Once  . pantoprazole sodium  40 mg Per Tube Q24H  . polyethylene glycol  17 g Per Tube Daily   Continuous Infusions: . sodium chloride 50 mL/hr at 08/26/20 0811  . ceFEPime (MAXIPIME) IV    . norepinephrine (LEVOPHED) Adult  infusion 6 mcg/min (08/26/20 0749)  . propofol (DIPRIVAN) infusion 35 mcg/kg/min (08/26/20 0852)  . sodium chloride Stopped (08/24/20 0107)   PRN Meds:.acetaminophen **OR** acetaminophen, fentaNYL (SUBLIMAZE) injection, ipratropium-albuterol, ondansetron **OR** ondansetron (ZOFRAN) IV     Interim History / Subjective:  Still requiring propofol/ did not tol PSV for long due to high wob / no longer on pressors   Objective   Blood pressure (!) 162/75, pulse 64, temperature (!) 97 F (36.1 C), temperature source Axillary, resp. rate 20, height _0  (1.676 m), weight 104 kg, SpO2 100 %. CVP:  [6 mmHg-12 mmHg] 8 mmHg  Vent Mode: PRVC FiO2 (%):  [40 %] 40 % Set Rate:  [20 bmp] 20 bmp Vt Set:  [480 mL] 480 mL PEEP:  [8 cmH20-10 cmH20] 8 cmH20 Plateau Pressure:  [20 cmH20-31 cmH20] 21 cmH20   Intake/Output Summary (Last 24 hours) at 08/26/2020 0911 Last data filed at 08/26/2020 0749 Gross per 24 hour  Intake 2400.89 ml  Output 2250 ml  Net 150.89 ml   Filed Weights   08/24/20 0139 08/24/20 0156  Weight: 104 kg 104 kg    Examination:  General  Tmax   99.8 Pt sedated on vent No jvd Oropharynx et Neck supple Lungs with a few scattered exp > insp  rhonchi bilaterally IRIR  no s3 or or sign murmur Abd obese with nl  excursion  Extr warm with trace pitting both LE        I personally reviewed images and agree with radiology impression as follows:  CXR:   Portable 2/23 1. Lines and tubes in stable position. 2. Low lung volumes. Persistent left base atelectasis/infiltrate and small left pleural effusion. Stable mild bilateral interstitial prominence. 3. Stable cardiomegaly.    Resolved Hospital Problem list     Assessment & Plan:   Acute hypoxic respiratory failure from bacterial HCAP vs amio toxicity - adjust PEEP/FiO2 to keep SpO2 90 to 95% - see micro flowsheet  rec  steroid trial 2/23 >>>    Septic shock from pneumonia. - pressors to keep MAP > 65 - see micro  flowsheet   Acute metabolic encephalopathy from sepsis and hypoxia. Hx of seizures, neuropathy. - RASS goal -1 to -2 - continue keppra - holding outpt aricept, cymbalta, neurontin, ativan  AKI from ATN 2nd to sepsis. CKD 3a. Lab Results  Component Value Date   CREATININE 0.96 08/26/2020   CREATININE 1.56 (H) 08/25/2020   CREATININE 2.00 (H) 08/24/2020   -baseline creatinine 1.40 from 04/08/20 - f/u BMET, monitor urine outpt  Chronic a fib, chronic diastolic CHF, HLD. - continue eliquis  - hold outpt torsemide - amiodarone d/c'd 2/22 with ESR 95    DM type 2 poorly controlled with hyperglycemia. Hx of hypothyroidism. - SSI - continue levothyroxine - hold outpt glucotrol, metformin  Anemia of chronic disease and critical illness. Lab Results  Component Value Date   HGB 8.8 (L) 08/26/2020   HGB 9.2 (L) 08/25/2020   HGB 10.4 (L) 08/23/2020   - f/u CBC - transfuse for Hb < 7 or significant bleeding  Hypernatremia  - changed fluids to 1/2 NS 2/23  Best practice (evaluated daily)  Diet: tube feeds DVT prophylaxis: eliquis GI prophylaxis: protonix Mobility: bed rest Disposition: ICU Code Status: full code  Daughter [name] at bedside am 2/22 updated  Labs    CMP Latest Ref Rng & Units 08/26/2020 08/25/2020 08/25/2020  Glucose 70 - 99 mg/dL 170(H) - 320(H)  BUN 8 - 23 mg/dL 28(H) - 37(H)  Creatinine 0.44 - 1.00 mg/dL 0.96 - 1.56(H)  Sodium 135 - 145 mmol/L 147(H) - 142  Potassium 3.5 - 5.1 mmol/L 3.2(L) 3.3(L) 3.4(L)  Chloride 98 - 111 mmol/L 109 - 100  CO2 22 - 32 mmol/L 27 - 30  Calcium 8.9 - 10.3 mg/dL 8.7(L) - 8.6(L)  Total Protein 6.5 - 8.1 g/dL 5.8(L) - -  Total Bilirubin 0.3 - 1.2 mg/dL 0.8 - -  Alkaline Phos 38 - 126 U/L 56 - -  AST 15 - 41 U/L 19 - -  ALT 0 - 44 U/L 20 - -    CBC Latest Ref Rng & Units 08/26/2020 08/25/2020 08/23/2020  WBC 4.0 - 10.5 K/uL 11.2(H) 13.0(H) 11.4(H)  Hemoglobin 12.0 - 15.0 g/dL 8.8(L) 9.2(L) 10.4(L)  Hematocrit 36.0 -  46.0 % 28.1(L) 29.2(L) 36.4  Platelets 150 - 400 K/uL 168 191 234    ABG    Component Value Date/Time   PHART 7.544 (H) 08/26/2020 0435   PCO2ART 31.9 (L) 08/26/2020 0435   PO2ART 157 (H) 08/26/2020 0435   HCO3 29.1 (H) 08/26/2020 0435   TCO2 27 01/26/2015 1132   ACIDBASEDEF 1.0 01/26/2015 1132   O2SAT 99.3 08/26/2020 0435    CBG (last 3)  Recent Labs  08/26/20 0036 08/26/20 0445 08/26/20 0744  GLUCAP 215* 169* 173*     The patient is critically ill with multiple organ systems failure and requires high complexity decision making for assessment and support, frequent evaluation and titration of therapies, application of advanced monitoring technologies and extensive interpretation of multiple databases. Critical Care Time devoted to patient care services described in this note is 35 minutes.     Christinia Gully, MD Pulmonary [facility] [place] [phone]   After 7:00 pm call Elink  [phone]

## 2020-08-26 NOTE — Progress Notes (Signed)
PROGRESS NOTE  [name] RUE:[id number] DOB: Oct 25, 1950 DOA: 08/23/2020 PCP: Gareth Morgan, MD  Brief History:   70 year old female admitted to the hospital with acute respiratory failure secondary to possible pneumonia.  Patient was intubated in the emergency room.  She was noted to be in septic shock and was started on vasopressors and antibiotics.  History is further complicated by recent diagnosis of cardiomyopathy and ejection fraction 15 to 20%.  She also has atrial fibrillation on amiodarone and Eliquis.  PCCM following. Patient recently hospitalized at [facility] with ACS/NSTEMI  from 08/17/20 to 08/21/20.  No evidence of ischemic cardiomyopathy based on left heart catheterization 2/15. Patient was switched to PO diuretics (torsemide 100 mg bid) on 2/17, after improvement in volume status. Beta-blockers were not introduced on discharge since patient has had moments of hypotension.  She was d/ced on hydralazine 25 mg tid, Jardiance 10 mg daily, and torsemide 100 mg daily.  She was continued on amio 200 mg daily.  She was d/ced back on her usual 2L [place].  It was felt her CM was due to uncontrolled HTN and afib with RR.   Assessment/Plan: Acute on chronic respiratory failure with hypoxia and hypercapnia -Secondary to pneumonia in the setting of underlying OSA -Patient was intubated on 2/20 and is on mechanical ventilation -Appreciate PCCM assistance -Other etiologies to be considered as amiodarone toxicity -weaning off diprivan in anticipation for weaning vent  Septic shock -Secondary to pneumonia -MRSA PCR is negative, vancomycin discontinued -Unfortunately, she is continued to have fevers -Blood cultures have been sent -Continue on cefepime -Wean off Levophed  chronic systolic congestive heart failure -Recent echocardiogram at [facility] showed ejection fraction of 15 to 20% -No evidence of ischemic cardiomyopathy based on heart catheterization on 2/15 at [facility] -She was discharged  from [facility] on torsemide 100 mg twice daily -Currently, CVP is 7-9 and blood pressures of also been low, will hold off on resuming diuretics at this time.  Bradycardia -Patient noted to have episodes of bradycardia with pauses -EKG shows prolonged QT interval, and diffuse T wave inversions -We will hold off on further amiodarone -08/17/20 Echo--EF 15-20%, mod TR, severe elevated PASP -Replace electrolytes -May need to have cardiology input if symptoms persist -If heart rate sustains bradycardic, may need to consider continuing on low-dose dobutamine/Levophed.  chronic kidney disease stage IIIa -Baseline creatinine approximately 1.1- 1.4 -Continue to monitor  Chronic atrial fibrillation -Anticoagulated with Eliquis -She is currently on amiodarone -She is having episodes of bradycardia, will hold off on further amnio for now  Diabetes Mellitus type 2 -Blood sugars elevated -08/17/20 A1C--7.2 -Started on lower dose Lantus -Continue on sliding scale -holding metformin and glipizide  FEN -hypokalemia-->replete -check Mag -continue enteral feeding at 40cc /hr  Hypothyroidism -Continue on Synthroid -TSH 0.638  Anemia of chronic disease -Baseline Hgb ~10 -Transfuse for hemoglobin less than 7 or if she has significant bleeding  History of seizures -Continue on Keppra  Dementia without behavioral disturbance -Continue Aricept       Status is: Inpatient  Remains inpatient appropriate due to IV therapy and hemodynamic instability  Dispo: The patient is from: Home              Anticipated d/c is to: Home              Anticipated d/c date is: 3 days              Patient currently is not  medically stable to d/c.   Difficult to place patient No        Family Communication:   Daughter updated at bedside 2/23  Consultants:  PCCM  Code Status:  FULL  DVT Prophylaxis:  apixaban  Procedures: As Listed in Progress Note Above  Antibiotics: Cefepime  2/21>>      Subjective: Patient sedated on ventilator.  No respiratory distress.  No vomiting, diarrhea, resp distress, uncontrolled pain  Objective: Vitals:   08/26/20 0800 08/26/20 1000 08/26/20 1100 08/26/20 1121  BP: (!) 162/75 (!) 158/79 (!) 143/74   Pulse: 64 70 80   Resp: 20 (!) 22 20   Temp:    99.4 F (37.4 C)  TempSrc:    Rectal  SpO2: 100% 100% 100%   Weight:      Height:        Intake/Output Summary (Last 24 hours) at 08/26/2020 1216 Last data filed at 08/26/2020 1000 Gross per 24 hour  Intake 1940.07 ml  Output 2225 ml  Net -284.93 ml   Weight change:  Exam:   General:  Pt is sedated on vent  HEENT: No icterus, No thrush, No neck mass, [place]/AT  Cardiovascular: RRR, S1/S2, no rubs, no gallops  Respiratory: bibasilar rales.  Diminished BS bilateral  Abdomen: Soft/+BS, non tender, non distended, no guarding  Extremities: trace LE edema, No lymphangitis, No petechiae, No rashes, no synovitis   Data Reviewed: I have personally reviewed following labs and imaging studies Basic Metabolic Panel: Recent Labs  Lab 08/23/20 2305 08/24/20 1002 08/25/20 0425 08/25/20 1656 08/26/20 0422  NA 142 144 142  --  147*  K 4.8 3.9 3.4* 3.3* 3.2*  CL 100 98 100  --  109  CO2 34* 30 30  --  27  GLUCOSE 215* 248* 320*  --  170*  BUN 39* 39* 37*  --  28*  CREATININE 2.15* 2.00* 1.56*  --  0.96  CALCIUM 8.9 9.0 8.6*  --  8.7*  MG  --   --  2.4  --   --   PHOS  --   --  2.8  --   --    Liver Function Tests: Recent Labs  Lab 08/23/20 2305 08/26/20 0422  AST 31 19  ALT 28 20  ALKPHOS 71 56  BILITOT 0.5 0.8  PROT 6.6 5.8*  ALBUMIN 3.4* 2.4*   No results for input(s): LIPASE, AMYLASE in the last 168 hours. No results for input(s): AMMONIA in the last 168 hours. Coagulation Profile: Recent Labs  Lab 08/23/20 2305  INR 1.8*   CBC: Recent Labs  Lab 08/23/20 2305 08/25/20 0726 08/26/20 0422  WBC 11.4* 13.0* 11.2*  HGB 10.4* 9.2* 8.8*  HCT 36.4  29.2* 28.1*  MCV 115.2* 102.8* 103.7*  PLT 234 191 168   Cardiac Enzymes: Recent Labs  Lab 08/23/20 2305  CKTOTAL 49   BNP: Invalid input(s): POCBNP CBG: Recent Labs  Lab 08/25/20 2038 08/26/20 0036 08/26/20 0445 08/26/20 0744 08/26/20 1116  GLUCAP 208* 215* 169* 173* 261*   HbA1C: Recent Labs    08/24/20 1002  HGBA1C 7.9*   Urine analysis:    Component Value Date/Time   COLORURINE AMBER (A) 08/23/2020 2346   APPEARANCEUR HAZY (A) 08/23/2020 2346   LABSPEC 1.011 08/23/2020 2346   PHURINE 5.0 08/23/2020 2346   GLUCOSEU >=500 (A) 08/23/2020 2346   HGBUR NEGATIVE 08/23/2020 2346   BILIRUBINUR NEGATIVE 08/23/2020 2346   KETONESUR NEGATIVE 08/23/2020 2346   PROTEINUR NEGATIVE 08/23/2020  2346   UROBILINOGEN 0.2 02/05/2015 1700   NITRITE NEGATIVE 08/23/2020 2346   LEUKOCYTESUR NEGATIVE 08/23/2020 2346   Sepsis Labs: @LABRCNTIP (procalcitonin:4,lacticidven:4) ) Recent Results (from the past 240 hour(s))  Resp Panel by RT-PCR (Flu A&B, Covid) Nasopharyngeal Swab     Status: None   Collection Time: 08/23/20 11:45 PM   Specimen: Nasopharyngeal Swab; Nasopharyngeal(NP) swabs in vial transport medium  Result Value Ref Range Status   SARS Coronavirus 2 by RT PCR NEGATIVE NEGATIVE Final    Comment: (NOTE) SARS-CoV-2 target nucleic acids are NOT DETECTED.  The SARS-CoV-2 RNA is generally detectable in upper respiratory specimens during the acute phase of infection. The lowest concentration of SARS-CoV-2 viral copies this assay can detect is 138 copies/mL. A negative result does not preclude SARS-Cov-2 infection and should not be used as the sole basis for treatment or other patient management decisions. A negative result may occur with  improper specimen collection/handling, submission of specimen other than nasopharyngeal swab, presence of viral mutation(s) within the areas targeted by this assay, and inadequate number of viral copies(<138 copies/mL). A negative  result must be combined with clinical observations, patient history, and epidemiological information. The expected result is Negative.  Fact Sheet for Patients:  BloggerCourse.com  Fact Sheet for Healthcare Providers:  SeriousBroker.it  This test is no t yet approved or cleared by the [place] FDA and  has been authorized for detection and/or diagnosis of SARS-CoV-2 by FDA under an Emergency Use Authorization (EUA). This EUA will remain  in effect (meaning this test can be used) for the duration of the COVID-19 declaration under Section 564(b)(1) of the Act, 21 U.S.C.section 360bbb-3(b)(1), unless the authorization is terminated  or revoked sooner.       Influenza A by PCR NEGATIVE NEGATIVE Final   Influenza B by PCR NEGATIVE NEGATIVE Final    Comment: (NOTE) The Xpert Xpress SARS-CoV-2/FLU/RSV plus assay is intended as an aid in the diagnosis of influenza from Nasopharyngeal swab specimens and should not be used as a sole basis for treatment. Nasal washings and aspirates are unacceptable for Xpert Xpress SARS-CoV-2/FLU/RSV testing.  Fact Sheet for Patients: BloggerCourse.com  Fact Sheet for Healthcare Providers: SeriousBroker.it  This test is not yet approved or cleared by the [place] FDA and has been authorized for detection and/or diagnosis of SARS-CoV-2 by FDA under an Emergency Use Authorization (EUA). This EUA will remain in effect (meaning this test can be used) for the duration of the COVID-19 declaration under Section 564(b)(1) of the Act, 21 U.S.C. section 360bbb-3(b)(1), unless the authorization is terminated or revoked.  Performed at [facility], [address] [place], [place] [address]   MRSA PCR Screening     Status: None   Collection Time: 08/24/20  9:57 AM   Specimen: Nasal Mucosa; Nasopharyngeal  Result Value Ref Range Status   MRSA by PCR  NEGATIVE NEGATIVE Final    Comment:        The GeneXpert MRSA Assay (FDA approved for NASAL specimens only), is one component of a comprehensive MRSA colonization surveillance program. It is not intended to diagnose MRSA infection nor to guide or monitor treatment for MRSA infections. Performed at [facility], [address] [place], [place] [address]   Culture, blood (routine x 2)     Status: None (Preliminary result)   Collection Time: 08/25/20  1:55 PM   Specimen: BLOOD RIGHT HAND  Result Value Ref Range Status   Specimen Description BLOOD RIGHT HAND  Final   Special  Requests   Final    BOTTLES DRAWN AEROBIC AND ANAEROBIC Blood Culture adequate volume   Culture   Final    NO GROWTH < 24 HOURS Performed at [facility], [address] [place], [place] [address]    Report Status PENDING  Incomplete  Culture, blood (routine x 2)     Status: None (Preliminary result)   Collection Time: 08/25/20  1:55 PM   Specimen: BLOOD  Result Value Ref Range Status   Specimen Description BLOOD RIGHT ANTECUBITAL  Final   Special Requests   Final    BOTTLES DRAWN AEROBIC AND ANAEROBIC Blood Culture adequate volume   Culture   Final    NO GROWTH < 24 HOURS Performed at [facility], [address] [place], [place] [address]    Report Status PENDING  Incomplete  Culture, Respiratory w Gram Stain     Status: None (Preliminary result)   Collection Time: 08/25/20  2:36 PM   Specimen: Tracheal Aspirate; Respiratory  Result Value Ref Range Status   Specimen Description   Final    TRACHEAL ASPIRATE Performed at [facility], [address] [place], [place] [address]    Special Requests   Final    NONE Performed at [facility], [address] [place], [place] [address]    Gram Stain NO WBC SEEN FEW GRAM POSITIVE COCCI   Final   Culture   Final    CULTURE REINCUBATED FOR BETTER GROWTH Performed at [facility], [address] [place], [place] [address]    Report Status PENDING   Incomplete     Scheduled Meds: . apixaban  5 mg Per NG tube BID  . Chlorhexidine Gluconate Cloth  6 each Topical Daily  . docusate  100 mg Per Tube BID  . donepezil  5 mg Per Tube QHS  . feeding supplement (PROSource TF)  45 mL Per Tube TID  . feeding supplement (VITAL HIGH PROTEIN)  1,000 mL Per Tube Q24H  . insulin aspart  0-9 Units Subcutaneous Q4H  . insulin glargine  10 Units Subcutaneous Daily  . levETIRAcetam  1,500 mg Per NG tube BID  . levothyroxine  175 mcg Per NG tube Daily  . LORazepam  2 mg Intravenous Once  . pantoprazole sodium  40 mg Per Tube Q24H  . polyethylene glycol  17 g Per Tube Daily   Continuous Infusions: . sodium chloride 50 mL/hr at 08/26/20 1015  . ceFEPime (MAXIPIME) IV 2 g (08/26/20 1020)  . norepinephrine (LEVOPHED) Adult infusion 3 mcg/min (08/26/20 1126)  . propofol (DIPRIVAN) infusion 25 mcg/kg/min (08/26/20 1125)  . sodium chloride Stopped (08/24/20 0107)    Procedures/Studies: CT ABDOMEN PELVIS WO CONTRAST  Result Date: 08/24/2020 CLINICAL DATA:  Status post fall, unresponsive. EXAM: CT CHEST, ABDOMEN AND PELVIS WITHOUT CONTRAST TECHNIQUE: Multidetector CT imaging of the chest, abdomen and pelvis was performed following the standard protocol without IV contrast. COMPARISON:  None. FINDINGS: CT CHEST FINDINGS Cardiovascular: A right internal jugular venous catheter is in place. There is moderate severity calcification of the aortic arch. There is moderate severity cardiomegaly. No pericardial effusion. Mediastinum/Nodes: No enlarged mediastinal, hilar, or axillary lymph nodes. Thyroid gland, trachea, and esophagus demonstrate no significant findings. Lungs/Pleura: Endotracheal and nasogastric tubes are in place. Moderate severity left upper lobe and bilateral lower lobe atelectasis and/or infiltrate is noted. Small bilateral pleural effusions are noted, right greater than left. No pneumothorax is identified. Musculoskeletal: There is evidence of prior  vertebroplasty at the level  of T12. A chronic fracture deformity of the sternum is seen. Multilevel degenerative changes are noted throughout the thoracic spine. CT ABDOMEN PELVIS FINDINGS Hepatobiliary: No focal liver abnormality is seen. Status post cholecystectomy. No biliary dilatation. Pancreas: Unremarkable. No pancreatic ductal dilatation or surrounding inflammatory changes. Spleen: Normal in size without focal abnormality. Adrenals/Urinary Tract: Adrenal glands are unremarkable. Kidneys are normal, without renal calculi, focal lesion, or hydronephrosis. A Foley catheter is seen within the urinary bladder. Stomach/Bowel: A nasogastric tube is seen with its distal tip noted within the proximal duodenum. The stomach is otherwise within normal limits. The appendix is not clearly visualized. No evidence of bowel wall thickening, distention, or inflammatory changes. Noninflamed diverticula are seen throughout the sigmoid colon. Vascular/Lymphatic: Aortic atherosclerosis. No enlarged abdominal or pelvic lymph nodes. Reproductive: Status post hysterectomy. No adnexal masses. Other: There is a 2.5 cm x 4.5 cm fat containing umbilical hernia. No abdominopelvic ascites. Musculoskeletal: Multilevel degenerative changes seen throughout the lumbar spine. IMPRESSION: 1. Moderate severity left upper lobe and bilateral lower lobe atelectasis and/or infiltrate. 2. Small bilateral pleural effusions, right greater than left. 3. Moderate severity cardiomegaly. 4. Evidence of prior vertebroplasty at the level of T12. 5. Sigmoid diverticulosis. 6. Fat-containing umbilical hernia. 7. Aortic atherosclerosis. Aortic Atherosclerosis (ICD10-I70.0). Electronically Signed   By: Aram Candela M.D.   On: 08/24/2020 00:39   CT HEAD WO CONTRAST  Result Date: 08/24/2020 CLINICAL DATA:  Status post fall, unresponsive. EXAM: CT HEAD WITHOUT CONTRAST TECHNIQUE: Contiguous axial images were obtained from the base of the skull through the  vertex without intravenous contrast. COMPARISON:  August 15, 2020 FINDINGS: Brain: There is mild cerebral atrophy with widening of the extra-axial spaces and ventricular dilatation. There are areas of decreased attenuation within the white matter tracts of the supratentorial brain, consistent with microvascular disease changes. Vascular: No hyperdense vessel or unexpected calcification. Skull: Normal. Negative for fracture or focal lesion. Sinuses/Orbits: No acute finding. Other: Endotracheal and orogastric tubes are in place. IMPRESSION: No acute intracranial pathology. Electronically Signed   By: Aram Candela M.D.   On: 08/24/2020 00:30   CT Chest Wo Contrast  Result Date: 08/24/2020 CLINICAL DATA:  Status post fall, unresponsive. EXAM: CT CHEST, ABDOMEN AND PELVIS WITHOUT CONTRAST TECHNIQUE: Multidetector CT imaging of the chest, abdomen and pelvis was performed following the standard protocol without IV contrast. COMPARISON:  None. FINDINGS: CT CHEST FINDINGS Cardiovascular: A right internal jugular venous catheter is in place. There is moderate severity calcification of the aortic arch. There is moderate severity cardiomegaly. No pericardial effusion. Mediastinum/Nodes: No enlarged mediastinal, hilar, or axillary lymph nodes. Thyroid gland, trachea, and esophagus demonstrate no significant findings. Lungs/Pleura: Endotracheal and nasogastric tubes are in place. Moderate severity left upper lobe and bilateral lower lobe atelectasis and/or infiltrate is noted. Small bilateral pleural effusions are noted, right greater than left. No pneumothorax is identified. Musculoskeletal: There is evidence of prior vertebroplasty at the level of T12. A chronic fracture deformity of the sternum is seen. Multilevel degenerative changes are noted throughout the thoracic spine. CT ABDOMEN PELVIS FINDINGS Hepatobiliary: No focal liver abnormality is seen. Status post cholecystectomy. No biliary dilatation. Pancreas:  Unremarkable. No pancreatic ductal dilatation or surrounding inflammatory changes. Spleen: Normal in size without focal abnormality. Adrenals/Urinary Tract: Adrenal glands are unremarkable. Kidneys are normal, without renal calculi, focal lesion, or hydronephrosis. A Foley catheter is seen within the urinary bladder. Stomach/Bowel: A nasogastric tube is seen with its distal tip noted within the proximal duodenum. The stomach is otherwise  within normal limits. The appendix is not clearly visualized. No evidence of bowel wall thickening, distention, or inflammatory changes. Noninflamed diverticula are seen throughout the sigmoid colon. Vascular/Lymphatic: Aortic atherosclerosis. No enlarged abdominal or pelvic lymph nodes. Reproductive: Status post hysterectomy. No adnexal masses. Other: There is a 2.5 cm x 4.5 cm fat containing umbilical hernia. No abdominopelvic ascites. Musculoskeletal: Multilevel degenerative changes seen throughout the lumbar spine. IMPRESSION: 1. Moderate severity left upper lobe and bilateral lower lobe atelectasis and/or infiltrate. 2. Small bilateral pleural effusions, right greater than left. 3. Moderate severity cardiomegaly. 4. Evidence of prior vertebroplasty at the level of T12. 5. Sigmoid diverticulosis. 6. Fat-containing umbilical hernia. 7. Aortic atherosclerosis. Aortic Atherosclerosis (ICD10-I70.0). Electronically Signed   By: Aram Candela M.D.   On: 08/24/2020 00:40   CT CERVICAL SPINE WO CONTRAST  Result Date: 08/24/2020 CLINICAL DATA:  Status post fall, unresponsive. EXAM: CT CERVICAL SPINE WITHOUT CONTRAST TECHNIQUE: Multidetector CT imaging of the cervical spine was performed without intravenous contrast. Multiplanar CT image reconstructions were also generated. COMPARISON:  October 04, 2012 FINDINGS: Alignment: Normal. Skull base and vertebrae: No acute fracture. No primary bone lesion or focal pathologic process. Soft tissues and spinal canal: No prevertebral fluid or  swelling. No visible canal hematoma. Disc levels: Marked severity endplate sclerosis and osteophyte formation is seen at the levels of C3-C4, C4-C5, C5-C6 and C6-C7. There is marked severity narrowing of the anterior atlantoaxial articulation. Mild to moderate severity intervertebral disc space narrowing is seen at the levels of C5-C6 and C6-C7. Moderate severity bilateral multilevel facet joint hypertrophy is noted. Upper chest: Negative. Other: Endotracheal and orogastric tubes are noted. IMPRESSION: 1. No acute osseous abnormality. 2. Marked severity multilevel degenerative changes, most prominent at the levels of C3-C4, C4-C5, C5-C6 and C6-C7. Electronically Signed   By: Aram Candela M.D.   On: 08/24/2020 00:32   DG CHEST PORT 1 VIEW  Result Date: 08/26/2020 CLINICAL DATA:  Intubation. EXAM: PORTABLE CHEST 1 VIEW COMPARISON:  08/25/2020. FINDINGS: Endotracheal tube, NG tube, right IJ line in stable position. Stable cardiomegaly. Low lung volumes persistent left base atelectasis/infiltrate. Stable mild bilateral interstitial prominence. Small left pleural effusion again noted. No pneumothorax. IMPRESSION: 1. Lines and tubes in stable position. 2. Low lung volumes. Persistent left base atelectasis/infiltrate and small left pleural effusion. Stable mild bilateral interstitial prominence. 3. Stable cardiomegaly. Electronically Signed   By: Maisie Fus  Register   On: 08/26/2020 05:40   DG Chest Port 1 View  Result Date: 08/25/2020 CLINICAL DATA:  Respiratory failure. EXAM: PORTABLE CHEST 1 VIEW COMPARISON:  Chest x-ray 08/23/2020. FINDINGS: Endotracheal tube, NG tube in stable position. Right IJ line in unchanged position with tip in the right atrium. Cardiomegaly with bilateral interstitial prominence suggesting interstitial edema. Left base atelectasis/infiltrate. No prominent pleural effusion. No pneumothorax. IMPRESSION: 1. Endotracheal tube, NG tube in stable position. Right IJ line in unchanged  position with tip in the right atrium 2. Cardiomegaly with bilateral interstitial prominence suggesting interstitial edema. 3.  Left base atelectasis/infiltrate. Electronically Signed   By: Maisie Fus  Register   On: 08/25/2020 05:27   DG Chest Portable 1 View  Result Date: 08/23/2020 CLINICAL DATA:  Status post intubation. EXAM: PORTABLE CHEST 1 VIEW COMPARISON:  August 17, 2020 FINDINGS: An endotracheal tube is seen with its distal tip approximately 2.1 cm from the carina. A nasogastric tube is noted with its distal end seen within the expected region of the gastric antrum. There is a right internal jugular venous catheter. Its distal tip  is seen within the right atrium, approximately 5.2 cm distal to the junction of the superior vena cava and right atrium. Mild atelectasis and/or infiltrate is noted within the right lung base. There is no evidence of a pleural effusion or pneumothorax. The cardiac silhouette is markedly enlarged. The visualized skeletal structures are unremarkable. IMPRESSION: 1. Interval endotracheal tube, nasogastric tube and right internal jugular venous catheter placement and positioning, as described above. 2. Mild right basilar atelectasis and/or infiltrate. Electronically Signed   By: Aram Candela M.D.   On: 08/23/2020 23:56    Catarina Hartshorn, DO  [facility]  If 7PM-7AM, please contact night-coverage [email or web address] Password [facility] 08/26/2020, 12:16 PM   LOS: 2 days

## 2020-08-27 DIAGNOSIS — I482 Chronic atrial fibrillation, unspecified: Secondary | ICD-10-CM | POA: Diagnosis not present

## 2020-08-27 DIAGNOSIS — J9601 Acute respiratory failure with hypoxia: Secondary | ICD-10-CM | POA: Diagnosis not present

## 2020-08-27 DIAGNOSIS — J181 Lobar pneumonia, unspecified organism: Secondary | ICD-10-CM | POA: Diagnosis not present

## 2020-08-27 DIAGNOSIS — I5022 Chronic systolic (congestive) heart failure: Secondary | ICD-10-CM | POA: Diagnosis not present

## 2020-08-27 DIAGNOSIS — F0390 Unspecified dementia without behavioral disturbance: Secondary | ICD-10-CM | POA: Diagnosis not present

## 2020-08-27 LAB — CULTURE, RESPIRATORY W GRAM STAIN: Gram Stain: NONE SEEN

## 2020-08-27 LAB — COMPREHENSIVE METABOLIC PANEL
ALT: 17 U/L (ref 0–44)
AST: 14 U/L — ABNORMAL LOW (ref 15–41)
Albumin: 2.2 g/dL — ABNORMAL LOW (ref 3.5–5.0)
Alkaline Phosphatase: 53 U/L (ref 38–126)
Anion gap: 11 (ref 5–15)
BUN: 38 mg/dL — ABNORMAL HIGH (ref 8–23)
CO2: 24 mmol/L (ref 22–32)
Calcium: 8.6 mg/dL — ABNORMAL LOW (ref 8.9–10.3)
Chloride: 109 mmol/L (ref 98–111)
Creatinine, Ser: 1.16 mg/dL — ABNORMAL HIGH (ref 0.44–1.00)
GFR, Estimated: 50 mL/min — ABNORMAL LOW (ref >60)
Glucose, Bld: 352 mg/dL — ABNORMAL HIGH (ref 70–99)
Potassium: 3.8 mmol/L (ref 3.5–5.1)
Sodium: 144 mmol/L (ref 135–145)
Total Bilirubin: 0.8 mg/dL (ref 0.3–1.2)
Total Protein: 5.8 g/dL — ABNORMAL LOW (ref 6.5–8.1)

## 2020-08-27 LAB — MAGNESIUM: Magnesium: 2.3 mg/dL (ref 1.7–2.4)

## 2020-08-27 LAB — GLUCOSE, CAPILLARY
Glucose-Capillary: 293 mg/dL — ABNORMAL HIGH (ref 70–99)
Glucose-Capillary: 337 mg/dL — ABNORMAL HIGH (ref 70–99)
Glucose-Capillary: 350 mg/dL — ABNORMAL HIGH (ref 70–99)
Glucose-Capillary: 354 mg/dL — ABNORMAL HIGH (ref 70–99)
Glucose-Capillary: 359 mg/dL — ABNORMAL HIGH (ref 70–99)
Glucose-Capillary: 369 mg/dL — ABNORMAL HIGH (ref 70–99)

## 2020-08-27 LAB — CBC
HCT: 26.7 % — ABNORMAL LOW (ref 36.0–46.0)
Hemoglobin: 8.2 g/dL — ABNORMAL LOW (ref 12.0–15.0)
MCH: 32.8 pg (ref 26.0–34.0)
MCHC: 30.7 g/dL (ref 30.0–36.0)
MCV: 106.8 fL — ABNORMAL HIGH (ref 80.0–100.0)
Platelets: 153 10*3/uL (ref 150–400)
RBC: 2.5 MIL/uL — ABNORMAL LOW (ref 3.87–5.11)
RDW: 15.1 % (ref 11.5–15.5)
WBC: 7.4 10*3/uL (ref 4.0–10.5)
nRBC: 0.5 % — ABNORMAL HIGH (ref 0.0–0.2)

## 2020-08-27 LAB — LEGIONELLA PNEUMOPHILA SEROGP 1 UR AG: L. pneumophila Serogp 1 Ur Ag: NEGATIVE

## 2020-08-27 MED ORDER — MIDAZOLAM HCL (PF) 5 MG/ML IJ SOLN: 5.0000 mg | Freq: Once | INTRAMUSCULAR | Status: AC

## 2020-08-27 MED ORDER — MIDAZOLAM HCL (PF) 10 MG/2ML IJ SOLN
INTRAMUSCULAR | Status: AC
  Administered 2020-08-27: 5 mg via INTRAVENOUS
  Filled 2020-08-27: qty 2

## 2020-08-27 MED ORDER — DEXMEDETOMIDINE HCL IN NACL 400 MCG/100ML IV SOLN
0.4000 ug/kg/h | INTRAVENOUS | Status: DC
  Administered 2020-08-27: 0.4 ug/kg/h via INTRAVENOUS
  Filled 2020-08-27: qty 100

## 2020-08-27 MED ORDER — MIDAZOLAM 50MG/50ML (1MG/ML) PREMIX INFUSION
0.5000 mg/h | INTRAVENOUS | Status: DC
  Administered 2020-08-27: 0.5 mg/h via INTRAVENOUS
  Filled 2020-08-27: qty 50

## 2020-08-27 MED ORDER — VANCOMYCIN HCL 2000 MG/400ML IV SOLN
2000.0000 mg | Freq: Once | INTRAVENOUS | Status: AC
  Administered 2020-08-27: 2000 mg via INTRAVENOUS
  Filled 2020-08-27: qty 400

## 2020-08-27 MED ORDER — PIPERACILLIN-TAZOBACTAM 3.375 G IVPB
3.3750 g | Freq: Three times a day (TID) | INTRAVENOUS | Status: AC
  Administered 2020-08-27 – 2020-09-02 (×19): 3.375 g via INTRAVENOUS
  Filled 2020-08-27 (×19): qty 50

## 2020-08-27 MED ORDER — PIPERACILLIN-TAZOBACTAM 3.375 G IVPB
3.3750 g | Freq: Once | INTRAVENOUS | Status: AC
  Administered 2020-08-27: 3.375 g via INTRAVENOUS
  Filled 2020-08-27: qty 50

## 2020-08-27 MED ORDER — VANCOMYCIN HCL 1250 MG/250ML IV SOLN: 1250.0000 mg | INTRAVENOUS | Status: DC

## 2020-08-27 MED ORDER — LOSARTAN POTASSIUM 25 MG PO TABS
25.0000 mg | ORAL_TABLET | Freq: Two times a day (BID) | ORAL | Status: DC
  Administered 2020-08-27 – 2020-08-28 (×3): 25 mg
  Filled 2020-08-27 (×3): qty 1

## 2020-08-27 MED ORDER — INSULIN GLARGINE 100 UNIT/ML ~~LOC~~ SOLN
20.0000 [IU] | Freq: Every day | SUBCUTANEOUS | Status: DC
  Administered 2020-08-27 – 2020-08-28 (×2): 20 [IU] via SUBCUTANEOUS
  Filled 2020-08-27 (×5): qty 0.2

## 2020-08-27 MED ORDER — INSULIN ASPART 100 UNIT/ML ~~LOC~~ SOLN
0.0000 [IU] | SUBCUTANEOUS | Status: DC
  Administered 2020-08-27 (×3): 15 [IU] via SUBCUTANEOUS
  Administered 2020-08-28 (×2): 5 [IU] via SUBCUTANEOUS
  Administered 2020-08-28: 11 [IU] via SUBCUTANEOUS
  Administered 2020-08-28: 8 [IU] via SUBCUTANEOUS
  Administered 2020-08-28: 15 [IU] via SUBCUTANEOUS
  Administered 2020-08-28: 2 [IU] via SUBCUTANEOUS
  Administered 2020-08-29: 3 [IU] via SUBCUTANEOUS
  Administered 2020-08-29: 11 [IU] via SUBCUTANEOUS
  Administered 2020-08-29: 8 [IU] via SUBCUTANEOUS
  Administered 2020-08-29 (×3): 5 [IU] via SUBCUTANEOUS
  Administered 2020-08-30: 3 [IU] via SUBCUTANEOUS
  Administered 2020-08-30: 5 [IU] via SUBCUTANEOUS
  Administered 2020-08-30: 3 [IU] via SUBCUTANEOUS
  Administered 2020-08-30: 5 [IU] via SUBCUTANEOUS
  Administered 2020-08-30: 3 [IU] via SUBCUTANEOUS
  Administered 2020-08-30 (×2): 5 [IU] via SUBCUTANEOUS
  Administered 2020-08-31: 3 [IU] via SUBCUTANEOUS

## 2020-08-27 MED ORDER — SODIUM CHLORIDE 0.9 % IV SOLN
2.0000 g | Freq: Two times a day (BID) | INTRAVENOUS | Status: DC
  Administered 2020-08-27: 2 g via INTRAVENOUS
  Filled 2020-08-27: qty 2

## 2020-08-27 NOTE — Progress Notes (Addendum)
Nutrition Follow-up  INTERVENTION:  -Vital High Protein @ 40 ml/hr via OGT   -Continue 45 ml ProSource TF per tube TID     Tube feeding regimen provides 1080 kcal, 117 grams of protein, and 803 ml of H2O.    -Total calories tube feeding and sedation - 1245 kcal, 117 gr protein. Regimen will meet 66% energy and 100% protein goal.  NUTRITION DIAGNOSIS:   Inadequate oral intake related to inability to eat as evidenced by NPO status.   GOAL:  Provide needs based on ASPEN/SCCM guidelines  MONITOR:   Vent status,Labs,Skin,I & O's,Weight trends,TF tolerance  ASSESSMENT: Patient presents with septic shock, pneumonia, acute respiratory failure, acute kidney injury on CKD-3a, DM-2 and obesity. Hx of diastolic heart failure, chronic atrial fibrillation.  Patient intubated on ventilator support 2/20 @ 2324. Discussed with nursing. Patient tolerating tube feeding regimen. She is off pressors and sedation rate has decreased. Will continue with current nutrition care.  MV: 13.1 L/min Temp (24hrs), Avg:99.6 F (37.6 C), Min:99.1 F (37.3 C), Max:100.4 F (38 C)  Propofol: 6.24 ml/hr (165 kcal)  IV-0.45 NS @ 50 ml/hr   Labs: BUN-38 (H), Cr 1.16 (H),Glucose 352 (H).   CBG (last 3)  Recent Labs    08/27/20 0348 08/27/20 0804 08/27/20 1126  GLUCAP 350* 293* 369*     Intake/Output Summary (Last 24 hours) at 08/27/2020 1139 Last data filed at 08/27/2020 0527 Gross per 24 hour  Intake 1695.03 ml  Output 1425 ml  Net 270.03 ml   Net since admission- +2.579  Weight history reviewed. Range 104-110 kg the past year.  Medications: colace, novolog, keppra. levophed  Diet Order:   Diet Order            Diet NPO time specified  Diet effective now                 EDUCATION NEEDS:  Not appropriate for education at this time  Skin:  Skin Assessment: Reviewed RN Assessment  Last BM:  2/20  Height:   Ht Readings from Last 1 Encounters:  08/27/20 5\' 6"  (1.676 m)    Weight:    Wt Readings from Last 1 Encounters:  08/27/20 106.9 kg    Ideal Body Weight:   59 kg  BMI:  Body mass index is 38.04 kg/m.  Estimated Nutritional Needs:   Kcal:  1901  Protein:  118-124 gr  Fluid:  1900 ml daily  08/29/20 MS,RD,CSG,LDN Pager: Royann Shivers

## 2020-08-27 NOTE — Progress Notes (Signed)
RT weaned patient on CPAP/PS 15/5 40% FIO2 for 2.5hrs. As RN further decreased sedation, patient HR increased to 130-140 and RR 39.RT placed patient back PRVC VT 510, RR, 16, 40% FIO2 and PEEP 5. RT will continue to monitor and assess.

## 2020-08-27 NOTE — Progress Notes (Signed)
Precedex started per order to attempt to wean off propofol. Pt's HR began to drop into the 40s-50s range very quickly with EKG changes. Precedex stopped and Dr. Sherene Sires and Dr. Arbutus Leas notified. See eMAR.

## 2020-08-27 NOTE — Progress Notes (Signed)
Per Dr. Sherene Sires, Propofol drip was stopped and Precedex restarted. Patient continued to be bradycardic with EKG changes and was becoming more alert and moving extremities. Dr. Sherene Sires notified and received order to push 5mg  of Versed, decrease the precedex, and if her vital signs improve, start a Versed drip. If this attempt at sedation fails we will continue with only propofol.

## 2020-08-27 NOTE — Progress Notes (Signed)
Pharmacy Antibiotic Note  [name] is a 70 y.o. female admitted on 08/23/2020 with bacteremia. Pharmacy has been consulted for Vancomycin and cefepime dosing.  Plan: Vancomycin 2000mg  loading dose then 1250mg  IV every 24 hours.  F/U cxs and clinical progress Monitor V/S, labs and levels as indicated  Height: 5\' 6"  (167.6 cm) Weight: 106.9 kg (235 lb 10.8 oz) IBW/kg (Calculated) : 59.3  Temp (24hrs), Avg:99.4 F (37.4 C), Min:99 F (37.2 C), Max:100.4 F (38 C)  Recent Labs  Lab 08/23/20 2305 08/23/20 2347 08/24/20 1002 08/25/20 0425 08/25/20 0726 08/26/20 0422 08/27/20 0410  WBC 11.4*  --   --   --  13.0* 11.2* 7.4  CREATININE 2.15*  --  2.00* 1.56*  --  0.96 1.16*  LATICACIDVEN  --  1.2  --   --   --   --   --     Estimated Creatinine Clearance: 55 mL/min (A) (by C-G formula based on SCr of 1.16 mg/dL (H)).    Allergies  Allergen Reactions  . Citalopram Hydrobromide Other (See Comments)    Dyskinesia Other reaction(s): Other (See Comments) Dyskinesia  . Codeine Nausea And Vomiting and Other (See Comments)    HALLUCINATIONS Other reaction(s): Unknown Other reaction(s): Other (See Comments) HALLUCINATIONS   . Hydromorphone Hcl Other (See Comments)    Made her pass out Other reaction(s): Other (See Comments) Made her pass out  . Metoclopramide Other (See Comments)    DYSKINESIA Other reaction(s): Other (See Comments) DYSKINESIA  . Hyoscyamine     MADE DIARRHEA WORSE    Antimicrobials this admission: Vancomycin 2/21>>2/21.08/28/20 Restarting 2/24 >> Cefepime 2/21>>2/24 Zosyn 2/24 >>  Microbiology results: 2/22 Gram + cocci in aerobic bottle, NG x 24 hours  2/22 Trach aspirate: few gram + cocci, enterococcus faecalis, pan sensitive 2/22 Resp panel: negative   Thank you for allowing pharmacy to be a part of this patient's care.  3/22, PharmD, MBA, BCGP Clinical Pharmacist  08/27/2020 6:24 PM

## 2020-08-27 NOTE — Progress Notes (Signed)
NAME:  [name], MRN:  [id number], DOB:  1950-12-31, LOS: 3 ADMISSION DATE:  08/23/2020, CONSULTATION DATE:  08/24/2020 REFERRING MD:  Dr. Sedonia Small, ER, CHIEF COMPLAINT:  Short of breath   Brief History:  70 yo female brought to [facility] ER unresponsive after being d/c from [facility] on 08/22/20 for acute coronary syndrome.  Reported she fell at home.  Intubated in ER.   Working dx ? HCAP vs amiodarone toxicity     Past Medical History:  Anemia, Barrett's esophagus, Systolic CHF, DM type 2, A fib, HLD, HTN, Seizures, Hypothyroidism  Significant Hospital Events:  2/20 Admit 2/22 stop amiodarone (esr 95) 2/23  Solumedrol trial     Consults:  PCCM  2/21  Procedures:  ETT 2/20 >> Rt IJ CVL 2/21 >>   Significant Diagnostic Tests:  CT head 2/21 >> mild atrophy, chronic microvascular disease CT chest 2/21 >> b/l ASD, small b/l effusions Cortisol fasting 2/23  = 25  Micro Data:  COVID 2/20 >> negative Flu PCR 2/20 >> negative MRSA PCR 2/20 >> negative ET 2/22 >  No wbc, few gpc >>> BC x 2 2/22 (while on cefepime)  >>> Urine strep 2/22 >> Urine legionella 2/22 >>>  Antimicrobials:  Vancomycin 2/20  Only  Cefepime 2/20 >>    Scheduled Meds: . apixaban  5 mg Per NG tube BID  . chlorhexidine gluconate (MEDLINE KIT)  15 mL Mouth Rinse BID  . Chlorhexidine Gluconate Cloth  6 each Topical Daily  . docusate  100 mg Per Tube BID  . donepezil  5 mg Per Tube QHS  . feeding supplement (PROSource TF)  45 mL Per Tube TID  . feeding supplement (VITAL HIGH PROTEIN)  1,000 mL Per Tube Q24H  . insulin aspart  0-15 Units Subcutaneous Q4H  . insulin glargine  20 Units Subcutaneous Daily  . levETIRAcetam  1,500 mg Per NG tube BID  . levothyroxine  175 mcg Per NG tube Daily  . LORazepam  2 mg Intravenous Once  . losartan  25 mg Per Tube BID  . mouth rinse  15 mL Mouth Rinse 10 times per day  . methylPREDNISolone (SOLU-MEDROL) injection  80 mg Intravenous Q12H  . pantoprazole sodium  40 mg Per Tube  Q24H  . polyethylene glycol  17 g Per Tube Daily   Continuous Infusions: . sodium chloride 50 mL/hr at 08/27/20 0602  . ceFEPime (MAXIPIME) IV 2 g (08/27/20 1050)  . dexmedetomidine (PRECEDEX) IV infusion    . norepinephrine (LEVOPHED) Adult infusion Stopped (08/26/20 1245)  . propofol (DIPRIVAN) infusion 35 mcg/kg/min (08/27/20 1131)  . sodium chloride Stopped (08/24/20 0107)   PRN Meds:.acetaminophen **OR** acetaminophen, fentaNYL (SUBLIMAZE) injection, ipratropium-albuterol, ondansetron **OR** ondansetron (ZOFRAN) IV     Interim History / Subjective:  Agitated high wob on diprovan taper   Objective   Blood pressure (!) 156/96, pulse 85, temperature 99.6 F (37.6 C), temperature source Rectal, resp. rate (!) 6, height _0  (1.676 m), weight 106.9 kg, SpO2 100 %. CVP:  [6 mmHg-13 mmHg] 13 mmHg  Vent Mode: PRVC FiO2 (%):  [40 %] 40 % Set Rate:  [16 bmp] 16 bmp Vt Set:  [510 mL] 510 mL PEEP:  [5 cmH20] 5 cmH20 Plateau Pressure:  [17 cmH20-22 cmH20] 21 cmH20   Intake/Output Summary (Last 24 hours) at 08/27/2020 1338 Last data filed at 08/27/2020 0527 Gross per 24 hour  Intake 1695.03 ml  Output 1425 ml  Net 270.03 ml   [employer]  08/24/20 0139 08/24/20 0156 08/27/20 0300  Weight: 104 kg 104 kg 106.9 kg    Examination:  General  Tmax   100.4 Pt nad on diprovan/ no pressors  No jvd Oropharynx clear,  mucosa nl Neck supple Lungs with a min distant exp  rhonchi bilaterally RRR no s3 or or sign murmur Abd obese with limited  excursion  Extr warm with trace edema         .    Resolved Hospital Problem list     Assessment & Plan:   Acute hypoxic respiratory failure from bacterial HCAP vs amio toxicity - adjust PEEP/FiO2 to keep SpO2 90 to 95% - see micro flowsheet  rec  Started steroid trial 2/23 >>>    Septic shock from pneumonia. - pressors to keep MAP > 65 - see micro flowsheet  - off pressors am 2/23 with cvp adequate ? May tol diuresis    Acute metabolic encephalopathy from sepsis and hypoxia. Hx of seizures, neuropathy. - RASS goal -1 to -2 - continue keppra - holding outpt aricept, cymbalta, neurontin, ativan  AKI from ATN 2nd to sepsis. CKD 3a. Lab Results  Component Value Date   CREATININE 1.16 (H) 08/27/2020   CREATININE 0.96 08/26/2020   CREATININE 1.56 (H) 08/25/2020   -baseline creatinine 1.40 from 04/08/20 - f/u BMET, monitor urine outpt  Chronic a fib, chronic diastolic CHF, HLD. - continue eliquis  - hold outpt torsemide - amiodarone d/c'd 2/22 with ESR 95 - added low dose losartan pm 2/24 and changed diprovan to precedex for weaning purposes     DM type 2 poorly controlled with hyperglycemia. Hx of hypothyroidism. - SSI - continue levothyroxine - hold outpt glucotrol, metformin  Anemia of chronic disease and critical illness. Lab Results  Component Value Date   HGB 8.2 (L) 08/27/2020   HGB 8.8 (L) 08/26/2020   HGB 9.2 (L) 08/25/2020   - f/u CBC - transfuse for Hb < 7 or significant bleeding  Hypernatremia  - changed fluids to 1/2 NS 2/23 >improved 2/24   Best practice (evaluated daily)  Diet: tube feeds DVT prophylaxis: eliquis GI prophylaxis: protonix Mobility: bed rest Disposition: ICU Code Status: full code  Daughter [name] at bedside am 2/22 updated  Labs    CMP Latest Ref Rng & Units 08/27/2020 08/26/2020 08/25/2020  Glucose 70 - 99 mg/dL 352(H) 170(H) -  BUN 8 - 23 mg/dL 38(H) 28(H) -  Creatinine 0.44 - 1.00 mg/dL 1.16(H) 0.96 -  Sodium 135 - 145 mmol/L 144 147(H) -  Potassium 3.5 - 5.1 mmol/L 3.8 3.2(L) 3.3(L)  Chloride 98 - 111 mmol/L 109 109 -  CO2 22 - 32 mmol/L 24 27 -  Calcium 8.9 - 10.3 mg/dL 8.6(L) 8.7(L) -  Total Protein 6.5 - 8.1 g/dL 5.8(L) 5.8(L) -  Total Bilirubin 0.3 - 1.2 mg/dL 0.8 0.8 -  Alkaline Phos 38 - 126 U/L 53 56 -  AST 15 - 41 U/L 14(L) 19 -  ALT 0 - 44 U/L 17 20 -    CBC Latest Ref Rng & Units 08/27/2020 08/26/2020 08/25/2020  WBC 4.0 - 10.5  K/uL 7.4 11.2(H) 13.0(H)  Hemoglobin 12.0 - 15.0 g/dL 8.2(L) 8.8(L) 9.2(L)  Hematocrit 36.0 - 46.0 % 26.7(L) 28.1(L) 29.2(L)  Platelets 150 - 400 K/uL 153 168 191    ABG    Component Value Date/Time   PHART 7.544 (H) 08/26/2020 0435   PCO2ART 31.9 (L) 08/26/2020 0435   PO2ART 157 (H) 08/26/2020 0435  HCO3 29.1 (H) 08/26/2020 0435   TCO2 27 01/26/2015 1132   ACIDBASEDEF 1.0 01/26/2015 1132   O2SAT 99.3 08/26/2020 0435    CBG (last 3)  Recent Labs    08/27/20 0348 08/27/20 0804 08/27/20 1126  GLUCAP 350* 293* 369*     The patient is critically ill with multiple organ systems failure and requires high complexity decision making for assessment and support, frequent evaluation and titration of therapies, application of advanced monitoring technologies and extensive interpretation of multiple databases. Critical Care Time devoted to patient care services described in this note is 35 minutes.    Christinia Gully, MD Pulmonary and [place] [phone]   After 7:00 pm call Elink  [phone]

## 2020-08-27 NOTE — Progress Notes (Signed)
Date and time results received: 08/27/20 1805 (use smartphrase ".now" to insert current time)  Test: Blood culture Critical Value: Aerobic bottle gram positive cocci  Name of Provider Notified: Dr. Arbutus Leas  Orders Received? Or Actions Taken?: No new orders at this time

## 2020-08-27 NOTE — Progress Notes (Signed)
PROGRESS NOTE  PEGGIE [name] RWE:[id number] DOB: [birth year] DOA: 08/23/2020 PCP: Lemmie Evens, MD  Brief History:   70 year old female admitted to the hospital with acute respiratory failure secondary to possible pneumonia. Patient was intubated in the emergency room. She was noted to be in septic shock and was started on vasopressors and antibiotics. History is further complicated by recent diagnosis of cardiomyopathy and ejection fraction 15 to 20%. She also has atrial fibrillation on amiodarone and Eliquis. PCCM following. Patient recently hospitalized at [facility] with ACS/NSTEMI  from 08/17/20 to 08/21/20.  No evidence of ischemic cardiomyopathy based on left heart catheterization 2/15. Patient was switched to PO diuretics (torsemide 100 mg bid) on 2/17, after improvement in volume status. Beta-blockers were not introduced on discharge since patient has had moments of hypotension.  She was d/ced on hydralazine 25 mg tid, Jardiance 10 mg daily, and torsemide 100 mg daily.  She was continued on amio 200 mg daily.  She was d/ced back on her usual 2L [place].  It was felt her CM was due to uncontrolled HTN and afib with RR.   Assessment/Plan: Acute on chronic respiratory failure with hypoxia and hypercapnia -Secondary to pneumonia in the setting of underlying OSA -Patient was intubated on 2/20 and is on mechanical ventilation -Appreciate PCCM assistance -Other etiologies to be considered as amiodarone toxicity -started IV solumedrol 2/23 -weaning off diprivan in anticipation for weaning vent  Septic shock -Secondary to pneumonia -MRSA PCR is negative, vancomycin discontinued -Unfortunately, she is continued to have fevers -Blood cultures have been sent -D/C cefepime -start zosyn 2/24 -Weaned off Levophed 6/19  chronic systolic congestive heart failure -Recent echocardiogram at [facility] showed ejection fraction of 15 to 20% -No evidence of ischemic cardiomyopathy based on heart  catheterization on 2/15 at [facility] -She was discharged from [facility] on torsemide 100 mg twice daily -Currently, CVP is 7-9 and blood pressures of also been low, will hold off on resuming diuretics at this time.  Bradycardia -Patient noted to have episodes of bradycardia with pauses -EKG shows prolonged QT interval, and diffuse T wave inversions -We will hold off on further amiodarone -08/17/20 Echo--EF 15-20%, mod TR, severe elevated PASP -Replace electrolytes -May need to have cardiology input if symptoms persist -If heart rate sustains bradycardic, may need to consider continuing on low-dose dobutamine/Levophed.  chronic kidney disease stage IIIa -Baseline creatinine approximately 1.1- 1.4 -Continue to monitor  Chronic atrial fibrillation -Anticoagulated with Eliquis -She is currently on amiodarone -She is having episodes of bradycardia, will hold off on further amnio for now  Diabetes Mellitus type 2 -Blood sugars elevated -08/17/20 A1C--7.2 -Increase Lantus to 20 units -Continue on sliding scale--increase to moderate scale -holding metformin and glipizide  FEN -hypokalemia-->replete -check Mag -continue enteral feeding at 40cc /hr -1/2NS started by PCCM  Hypothyroidism -Continue on Synthroid -TSH 0.638  Anemia of chronic disease -Baseline Hgb ~10 -Transfuse for hemoglobin less than 7 or if she has significant bleeding  History of seizures -Continue on Keppra  Dementia without behavioral disturbance -Continue Aricept       Status is: Inpatient  Remains inpatient appropriate due to IV therapy and hemodynamic instability  Dispo: The patient is from: Home  Anticipated d/c is to: Home  Anticipated d/c date is: 3 days  Patient currently is not medically stable to d/c.              Difficult to place patient No  Family Communication:   Daughter updated at bedside 2/24  Consultants:   PCCM  Code Status:  FULL  DVT Prophylaxis:  apixaban  Procedures: As Listed in Progress Note Above  Antibiotics: Cefepime 2/21>>2/24 Zosyn 2/24>>>        Subjective: Patient is sedated on ventilator.  No vomiting or diarrhea.  No resp distress.    Objective: Vitals:   08/27/20 0645 08/27/20 0700 08/27/20 0800 08/27/20 0908  BP: 113/68 120/70 129/78   Pulse: 83 76 73   Resp: (!) 0 (!) 0 16   Temp:      TempSrc:      SpO2: 95% 96% 100%   Weight:      Height:    '5\' 6"'  (1.676 m)    Intake/Output Summary (Last 24 hours) at 08/27/2020 1002 Last data filed at 08/27/2020 0527 Gross per 24 hour  Intake 1695.03 ml  Output 1425 ml  Net 270.03 ml   Weight change:  Exam:   General:  Pt is alert, follows commands appropriately, not in acute distress  HEENT: No icterus, No thrush, No neck mass, [place]/AT  Cardiovascular: IRRR, S1/S2, no rubs, no gallops  Respiratory: bilateral rales. No wheeze  Abdomen: Soft/+BS, non tender, non distended, no guarding  Extremities: No edema, No lymphangitis, No petechiae, No rashes, no synovitis   Data Reviewed: I have personally reviewed following labs and imaging studies Basic Metabolic Panel: Recent Labs  Lab 08/23/20 2305 08/24/20 1002 08/25/20 0425 08/25/20 1656 08/26/20 0422 08/27/20 0410  NA 142 144 142  --  147* 144  K 4.8 3.9 3.4* 3.3* 3.2* 3.8  CL 100 98 100  --  109 109  CO2 34* 30 30  --  27 24  GLUCOSE 215* 248* 320*  --  170* 352*  BUN 39* 39* 37*  --  28* 38*  CREATININE 2.15* 2.00* 1.56*  --  0.96 1.16*  CALCIUM 8.9 9.0 8.6*  --  8.7* 8.6*  MG  --   --  2.4  --   --  2.3  PHOS  --   --  2.8  --   --   --    Liver Function Tests: Recent Labs  Lab 08/23/20 2305 08/26/20 0422 08/27/20 0410  AST 31 19 14*  ALT '28 20 17  ' ALKPHOS 71 56 53  BILITOT 0.5 0.8 0.8  PROT 6.6 5.8* 5.8*  ALBUMIN 3.4* 2.4* 2.2*   No results for input(s): LIPASE, AMYLASE in the last 168 hours. No results for input(s):  AMMONIA in the last 168 hours. Coagulation Profile: Recent Labs  Lab 08/23/20 2305  INR 1.8*   CBC: Recent Labs  Lab 08/23/20 2305 08/25/20 0726 08/26/20 0422 08/27/20 0410  WBC 11.4* 13.0* 11.2* 7.4  HGB 10.4* 9.2* 8.8* 8.2*  HCT 36.4 29.2* 28.1* 26.7*  MCV 115.2* 102.8* 103.7* 106.8*  PLT 234 191 168 153   Cardiac Enzymes: Recent Labs  Lab 08/23/20 2305  CKTOTAL 49   BNP: Invalid input(s): POCBNP CBG: Recent Labs  Lab 08/26/20 1631 08/26/20 1937 08/26/20 2309 08/27/20 0348 08/27/20 0804  GLUCAP 202* 242* 337* 350* 293*   HbA1C: No results for input(s): HGBA1C in the last 72 hours. Urine analysis:    Component Value Date/Time   COLORURINE AMBER (A) 08/23/2020 2346   APPEARANCEUR HAZY (A) 08/23/2020 2346   LABSPEC 1.011 08/23/2020 2346   PHURINE 5.0 08/23/2020 2346   GLUCOSEU >=500 (A) 08/23/2020 2346   HGBUR NEGATIVE 08/23/2020 2346   BILIRUBINUR NEGATIVE  08/23/2020 2346   [place] 08/23/2020 2346   PROTEINUR NEGATIVE 08/23/2020 2346   UROBILINOGEN 0.2 02/05/2015 1700   NITRITE NEGATIVE 08/23/2020 2346   LEUKOCYTESUR NEGATIVE 08/23/2020 2346   Sepsis Labs: '@LABRCNTIP' (procalcitonin:4,lacticidven:4) ) Recent Results (from the past 240 hour(s))  Resp Panel by RT-PCR (Flu A&B, Covid) Nasopharyngeal Swab     Status: None   Collection Time: 08/23/20 11:45 PM   Specimen: Nasopharyngeal Swab; Nasopharyngeal(NP) swabs in vial transport medium  Result Value Ref Range Status   SARS Coronavirus 2 by RT PCR NEGATIVE NEGATIVE Final    Comment: (NOTE) SARS-CoV-2 target nucleic acids are NOT DETECTED.  The SARS-CoV-2 RNA is generally detectable in upper respiratory specimens during the acute phase of infection. The lowest concentration of SARS-CoV-2 viral copies this assay can detect is 138 copies/mL. A negative result does not preclude SARS-Cov-2 infection and should not be used as the sole basis for treatment or other patient management decisions.  A negative result may occur with  improper specimen collection/handling, submission of specimen other than nasopharyngeal swab, presence of viral mutation(s) within the areas targeted by this assay, and inadequate number of viral copies(<138 copies/mL). A negative result must be combined with clinical observations, patient history, and epidemiological information. The expected result is Negative.  Fact Sheet for Patients:  EntrepreneurPulse.com.au  Fact Sheet for Healthcare Providers:  IncredibleEmployment.be  This test is no t yet approved or cleared by the [place] FDA and  has been authorized for detection and/or diagnosis of SARS-CoV-2 by FDA under an Emergency Use Authorization (EUA). This EUA will remain  in effect (meaning this test can be used) for the duration of the COVID-19 declaration under Section 564(b)(1) of the Act, 21 U.S.C.section 360bbb-3(b)(1), unless the authorization is terminated  or revoked sooner.       Influenza A by PCR NEGATIVE NEGATIVE Final   Influenza B by PCR NEGATIVE NEGATIVE Final    Comment: (NOTE) The Xpert Xpress SARS-CoV-2/FLU/RSV plus assay is intended as an aid in the diagnosis of influenza from Nasopharyngeal swab specimens and should not be used as a sole basis for treatment. Nasal washings and aspirates are unacceptable for Xpert Xpress SARS-CoV-2/FLU/RSV testing.  Fact Sheet for Patients: EntrepreneurPulse.com.au  Fact Sheet for Healthcare Providers: IncredibleEmployment.be  This test is not yet approved or cleared by the [place] FDA and has been authorized for detection and/or diagnosis of SARS-CoV-2 by FDA under an Emergency Use Authorization (EUA). This EUA will remain in effect (meaning this test can be used) for the duration of the COVID-19 declaration under Section 564(b)(1) of the Act, 21 U.S.C. section 360bbb-3(b)(1), unless the authorization  is terminated or revoked.  Performed at [facility], [address], [place], [place] [address]   MRSA PCR Screening     Status: None   Collection Time: 08/24/20  9:57 AM   Specimen: Nasal Mucosa; Nasopharyngeal  Result Value Ref Range Status   MRSA by PCR NEGATIVE NEGATIVE Final    Comment:        The GeneXpert MRSA Assay (FDA approved for NASAL specimens only), is one component of a comprehensive MRSA colonization surveillance program. It is not intended to diagnose MRSA infection nor to guide or monitor treatment for MRSA infections. Performed at [facility], [address], [place], [place] [address]   Culture, blood (routine x 2)     Status: None (Preliminary result)   Collection Time: 08/25/20  1:55 PM   Specimen: BLOOD RIGHT HAND  Result Value Ref Range  Status   Specimen Description BLOOD RIGHT HAND  Final   Special Requests   Final    BOTTLES DRAWN AEROBIC AND ANAEROBIC Blood Culture adequate volume   Culture   Final    NO GROWTH < 24 HOURS Performed at [facility], [address], [place], [place] [address]    Report Status PENDING  Incomplete  Culture, blood (routine x 2)     Status: None (Preliminary result)   Collection Time: 08/25/20  1:55 PM   Specimen: BLOOD  Result Value Ref Range Status   Specimen Description BLOOD RIGHT ANTECUBITAL  Final   Special Requests   Final    BOTTLES DRAWN AEROBIC AND ANAEROBIC Blood Culture adequate volume   Culture   Final    NO GROWTH < 24 HOURS Performed at [facility], [address], [place], [place] [address]    Report Status PENDING  Incomplete  Culture, Respiratory w Gram Stain     Status: None   Collection Time: 08/25/20  2:36 PM   Specimen: Tracheal Aspirate; Respiratory  Result Value Ref Range Status   Specimen Description   Final    TRACHEAL ASPIRATE Performed at [facility], [address], [place], [place] [address]    Special Requests   Final    NONE Performed at [facility], [address]  [place], [place] [address]    Gram Stain   Final    NO WBC SEEN FEW GRAM POSITIVE COCCI Performed at [facility], [place] [address] [place], [place] [address]    Culture ABUNDANT ENTEROCOCCUS FAECALIS  Final   Report Status 08/27/2020 FINAL  Final   Organism ID, Bacteria ENTEROCOCCUS FAECALIS  Final      Susceptibility   Enterococcus faecalis - MIC*    AMPICILLIN <=2 SENSITIVE Sensitive     VANCOMYCIN 1 SENSITIVE Sensitive     GENTAMICIN SYNERGY SENSITIVE Sensitive     * ABUNDANT ENTEROCOCCUS FAECALIS     Scheduled Meds: . apixaban  5 mg Per NG tube BID  . chlorhexidine gluconate (MEDLINE KIT)  15 mL Mouth Rinse BID  . Chlorhexidine Gluconate Cloth  6 each Topical Daily  . docusate  100 mg Per Tube BID  . donepezil  5 mg Per Tube QHS  . feeding supplement (PROSource TF)  45 mL Per Tube TID  . feeding supplement (VITAL HIGH PROTEIN)  1,000 mL Per Tube Q24H  . insulin aspart  0-9 Units Subcutaneous Q4H  . insulin glargine  10 Units Subcutaneous Daily  . levETIRAcetam  1,500 mg Per NG tube BID  . levothyroxine  175 mcg Per NG tube Daily  . LORazepam  2 mg Intravenous Once  . mouth rinse  15 mL Mouth Rinse 10 times per day  . methylPREDNISolone (SOLU-MEDROL) injection  80 mg Intravenous Q12H  . pantoprazole sodium  40 mg Per Tube Q24H  . polyethylene glycol  17 g Per Tube Daily   Continuous Infusions: . sodium chloride 50 mL/hr at 08/27/20 0602  . ceFEPime (MAXIPIME) IV    . norepinephrine (LEVOPHED) Adult infusion Stopped (08/26/20 1245)  . propofol (DIPRIVAN) infusion 20 mcg/kg/min (08/27/20 0855)  . sodium chloride Stopped (08/24/20 0107)    Procedures/Studies: CT ABDOMEN PELVIS WO CONTRAST  Result Date: 08/24/2020 CLINICAL DATA:  Status post fall, unresponsive. EXAM: CT CHEST, ABDOMEN AND PELVIS WITHOUT CONTRAST TECHNIQUE: Multidetector CT imaging of the chest, abdomen and pelvis was performed following the standard protocol without IV contrast. COMPARISON:  None.  FINDINGS: CT  CHEST FINDINGS Cardiovascular: A right internal jugular venous catheter is in place. There is moderate severity calcification of the aortic arch. There is moderate severity cardiomegaly. No pericardial effusion. Mediastinum/Nodes: No enlarged mediastinal, hilar, or axillary lymph nodes. Thyroid gland, trachea, and esophagus demonstrate no significant findings. Lungs/Pleura: Endotracheal and nasogastric tubes are in place. Moderate severity left upper lobe and bilateral lower lobe atelectasis and/or infiltrate is noted. Small bilateral pleural effusions are noted, right greater than left. No pneumothorax is identified. Musculoskeletal: There is evidence of prior vertebroplasty at the level of T12. A chronic fracture deformity of the sternum is seen. Multilevel degenerative changes are noted throughout the thoracic spine. CT ABDOMEN PELVIS FINDINGS Hepatobiliary: No focal liver abnormality is seen. Status post cholecystectomy. No biliary dilatation. Pancreas: Unremarkable. No pancreatic ductal dilatation or surrounding inflammatory changes. Spleen: Normal in size without focal abnormality. Adrenals/Urinary Tract: Adrenal glands are unremarkable. Kidneys are normal, without renal calculi, focal lesion, or hydronephrosis. A Foley catheter is seen within the urinary bladder. Stomach/Bowel: A nasogastric tube is seen with its distal tip noted within the proximal duodenum. The stomach is otherwise within normal limits. The appendix is not clearly visualized. No evidence of bowel wall thickening, distention, or inflammatory changes. Noninflamed diverticula are seen throughout the sigmoid colon. Vascular/Lymphatic: Aortic atherosclerosis. No enlarged abdominal or pelvic lymph nodes. Reproductive: Status post hysterectomy. No adnexal masses. Other: There is a 2.5 cm x 4.5 cm fat containing umbilical hernia. No abdominopelvic ascites. Musculoskeletal: Multilevel degenerative changes seen throughout the lumbar  spine. IMPRESSION: 1. Moderate severity left upper lobe and bilateral lower lobe atelectasis and/or infiltrate. 2. Small bilateral pleural effusions, right greater than left. 3. Moderate severity cardiomegaly. 4. Evidence of prior vertebroplasty at the level of T12. 5. Sigmoid diverticulosis. 6. Fat-containing umbilical hernia. 7. Aortic atherosclerosis. Aortic Atherosclerosis (ICD10-I70.0). Electronically Signed   By: Virgina Norfolk M.D.   On: 08/24/2020 00:39   CT HEAD WO CONTRAST  Result Date: 08/24/2020 CLINICAL DATA:  Status post fall, unresponsive. EXAM: CT HEAD WITHOUT CONTRAST TECHNIQUE: Contiguous axial images were obtained from the base of the skull through the vertex without intravenous contrast. COMPARISON:  August 15, 2020 FINDINGS: Brain: There is mild cerebral atrophy with widening of the extra-axial spaces and ventricular dilatation. There are areas of decreased attenuation within the white matter tracts of the supratentorial brain, consistent with microvascular disease changes. Vascular: No hyperdense vessel or unexpected calcification. Skull: Normal. Negative for fracture or focal lesion. Sinuses/Orbits: No acute finding. Other: Endotracheal and orogastric tubes are in place. IMPRESSION: No acute intracranial pathology. Electronically Signed   By: Virgina Norfolk M.D.   On: 08/24/2020 00:30   CT Chest Wo Contrast  Result Date: 08/24/2020 CLINICAL DATA:  Status post fall, unresponsive. EXAM: CT CHEST, ABDOMEN AND PELVIS WITHOUT CONTRAST TECHNIQUE: Multidetector CT imaging of the chest, abdomen and pelvis was performed following the standard protocol without IV contrast. COMPARISON:  None. FINDINGS: CT CHEST FINDINGS Cardiovascular: A right internal jugular venous catheter is in place. There is moderate severity calcification of the aortic arch. There is moderate severity cardiomegaly. No pericardial effusion. Mediastinum/Nodes: No enlarged mediastinal, hilar, or axillary lymph nodes.  Thyroid gland, trachea, and esophagus demonstrate no significant findings. Lungs/Pleura: Endotracheal and nasogastric tubes are in place. Moderate severity left upper lobe and bilateral lower lobe atelectasis and/or infiltrate is noted. Small bilateral pleural effusions are noted, right greater than left. No pneumothorax is identified. Musculoskeletal: There is evidence of prior vertebroplasty at the level of T12. A chronic fracture  deformity of the sternum is seen. Multilevel degenerative changes are noted throughout the thoracic spine. CT ABDOMEN PELVIS FINDINGS Hepatobiliary: No focal liver abnormality is seen. Status post cholecystectomy. No biliary dilatation. Pancreas: Unremarkable. No pancreatic ductal dilatation or surrounding inflammatory changes. Spleen: Normal in size without focal abnormality. Adrenals/Urinary Tract: Adrenal glands are unremarkable. Kidneys are normal, without renal calculi, focal lesion, or hydronephrosis. A Foley catheter is seen within the urinary bladder. Stomach/Bowel: A nasogastric tube is seen with its distal tip noted within the proximal duodenum. The stomach is otherwise within normal limits. The appendix is not clearly visualized. No evidence of bowel wall thickening, distention, or inflammatory changes. Noninflamed diverticula are seen throughout the sigmoid colon. Vascular/Lymphatic: Aortic atherosclerosis. No enlarged abdominal or pelvic lymph nodes. Reproductive: Status post hysterectomy. No adnexal masses. Other: There is a 2.5 cm x 4.5 cm fat containing umbilical hernia. No abdominopelvic ascites. Musculoskeletal: Multilevel degenerative changes seen throughout the lumbar spine. IMPRESSION: 1. Moderate severity left upper lobe and bilateral lower lobe atelectasis and/or infiltrate. 2. Small bilateral pleural effusions, right greater than left. 3. Moderate severity cardiomegaly. 4. Evidence of prior vertebroplasty at the level of T12. 5. Sigmoid diverticulosis. 6.  Fat-containing umbilical hernia. 7. Aortic atherosclerosis. Aortic Atherosclerosis (ICD10-I70.0). Electronically Signed   By: Virgina Norfolk M.D.   On: 08/24/2020 00:40   CT CERVICAL SPINE WO CONTRAST  Result Date: 08/24/2020 CLINICAL DATA:  Status post fall, unresponsive. EXAM: CT CERVICAL SPINE WITHOUT CONTRAST TECHNIQUE: Multidetector CT imaging of the cervical spine was performed without intravenous contrast. Multiplanar CT image reconstructions were also generated. COMPARISON:  October 04, 2012 FINDINGS: Alignment: Normal. Skull base and vertebrae: No acute fracture. No primary bone lesion or focal pathologic process. Soft tissues and spinal canal: No prevertebral fluid or swelling. No visible canal hematoma. Disc levels: Marked severity endplate sclerosis and osteophyte formation is seen at the levels of C3-C4, C4-C5, C5-C6 and C6-C7. There is marked severity narrowing of the anterior atlantoaxial articulation. Mild to moderate severity intervertebral disc space narrowing is seen at the levels of C5-C6 and C6-C7. Moderate severity bilateral multilevel facet joint hypertrophy is noted. Upper chest: Negative. Other: Endotracheal and orogastric tubes are noted. IMPRESSION: 1. No acute osseous abnormality. 2. Marked severity multilevel degenerative changes, most prominent at the levels of C3-C4, C4-C5, C5-C6 and C6-C7. Electronically Signed   By: Virgina Norfolk M.D.   On: 08/24/2020 00:32   DG CHEST PORT 1 VIEW  Result Date: 08/26/2020 CLINICAL DATA:  Intubation. EXAM: PORTABLE CHEST 1 VIEW COMPARISON:  08/25/2020. FINDINGS: Endotracheal tube, NG tube, right IJ line in stable position. Stable cardiomegaly. Low lung volumes persistent left base atelectasis/infiltrate. Stable mild bilateral interstitial prominence. Small left pleural effusion again noted. No pneumothorax. IMPRESSION: 1. Lines and tubes in stable position. 2. Low lung volumes. Persistent left base atelectasis/infiltrate and small left  pleural effusion. Stable mild bilateral interstitial prominence. 3. Stable cardiomegaly. Electronically Signed   By: Marcello Moores  Register   On: 08/26/2020 05:40   DG Chest Port 1 View  Result Date: 08/25/2020 CLINICAL DATA:  Respiratory failure. EXAM: PORTABLE CHEST 1 VIEW COMPARISON:  Chest x-ray 08/23/2020. FINDINGS: Endotracheal tube, NG tube in stable position. Right IJ line in unchanged position with tip in the right atrium. Cardiomegaly with bilateral interstitial prominence suggesting interstitial edema. Left base atelectasis/infiltrate. No prominent pleural effusion. No pneumothorax. IMPRESSION: 1. Endotracheal tube, NG tube in stable position. Right IJ line in unchanged position with tip in the right atrium 2. Cardiomegaly with bilateral interstitial prominence  suggesting interstitial edema. 3.  Left base atelectasis/infiltrate. Electronically Signed   By: Marcello Moores  Register   On: 08/25/2020 05:27   DG Chest Portable 1 View  Result Date: 08/23/2020 CLINICAL DATA:  Status post intubation. EXAM: PORTABLE CHEST 1 VIEW COMPARISON:  August 17, 2020 FINDINGS: An endotracheal tube is seen with its distal tip approximately 2.1 cm from the carina. A nasogastric tube is noted with its distal end seen within the expected region of the gastric antrum. There is a right internal jugular venous catheter. Its distal tip is seen within the right atrium, approximately 5.2 cm distal to the junction of the superior vena cava and right atrium. Mild atelectasis and/or infiltrate is noted within the right lung base. There is no evidence of a pleural effusion or pneumothorax. The cardiac silhouette is markedly enlarged. The visualized skeletal structures are unremarkable. IMPRESSION: 1. Interval endotracheal tube, nasogastric tube and right internal jugular venous catheter placement and positioning, as described above. 2. Mild right basilar atelectasis and/or infiltrate. Electronically Signed   By: Virgina Norfolk M.D.   On:  08/23/2020 23:56    Orson Eva, DO  [facility]  If 7PM-7AM, please contact night-coverage [email or web address] Password [email or web address] 08/27/2020, 10:02 AM   LOS: 3 days

## 2020-08-28 ENCOUNTER — Inpatient Hospital Stay (HOSPITAL_COMMUNITY): Payer: PPO

## 2020-08-28 DIAGNOSIS — F0390 Unspecified dementia without behavioral disturbance: Secondary | ICD-10-CM | POA: Diagnosis not present

## 2020-08-28 DIAGNOSIS — I482 Chronic atrial fibrillation, unspecified: Secondary | ICD-10-CM | POA: Diagnosis not present

## 2020-08-28 DIAGNOSIS — J181 Lobar pneumonia, unspecified organism: Secondary | ICD-10-CM | POA: Diagnosis not present

## 2020-08-28 DIAGNOSIS — J9601 Acute respiratory failure with hypoxia: Secondary | ICD-10-CM | POA: Diagnosis not present

## 2020-08-28 DIAGNOSIS — I5022 Chronic systolic (congestive) heart failure: Secondary | ICD-10-CM | POA: Diagnosis not present

## 2020-08-28 LAB — BLOOD CULTURE ID PANEL (REFLEXED) - BCID2

## 2020-08-28 LAB — GLUCOSE, CAPILLARY
Glucose-Capillary: 366 mg/dL — ABNORMAL HIGH (ref 70–99)
Glucose-Capillary: 256 mg/dL — ABNORMAL HIGH (ref 70–99)
Glucose-Capillary: 130 mg/dL — ABNORMAL HIGH (ref 70–99)
Glucose-Capillary: 232 mg/dL — ABNORMAL HIGH (ref 70–99)
Glucose-Capillary: 245 mg/dL — ABNORMAL HIGH (ref 70–99)

## 2020-08-28 LAB — COMPREHENSIVE METABOLIC PANEL
ALT: 18 U/L (ref 0–44)
AST: 14 U/L — ABNORMAL LOW (ref 15–41)
Albumin: 2.4 g/dL — ABNORMAL LOW (ref 3.5–5.0)
Alkaline Phosphatase: 49 U/L (ref 38–126)
Anion gap: 12 (ref 5–15)
BUN: 50 mg/dL — ABNORMAL HIGH (ref 8–23)
CO2: 23 mmol/L (ref 22–32)
Calcium: 9 mg/dL (ref 8.9–10.3)
Chloride: 110 mmol/L (ref 98–111)
Creatinine, Ser: 1.22 mg/dL — ABNORMAL HIGH (ref 0.44–1.00)
GFR, Estimated: 47 mL/min — ABNORMAL LOW (ref >60)
Glucose, Bld: 284 mg/dL — ABNORMAL HIGH (ref 70–99)
Potassium: 3.5 mmol/L (ref 3.5–5.1)
Sodium: 145 mmol/L (ref 135–145)
Total Bilirubin: 0.3 mg/dL (ref 0.3–1.2)
Total Protein: 6.2 g/dL — ABNORMAL LOW (ref 6.5–8.1)

## 2020-08-28 LAB — CBC
HCT: 28.1 % — ABNORMAL LOW (ref 36.0–46.0)
Hemoglobin: 8.7 g/dL — ABNORMAL LOW (ref 12.0–15.0)
MCH: 33 pg (ref 26.0–34.0)
MCHC: 31 g/dL (ref 30.0–36.0)
MCV: 106.4 fL — ABNORMAL HIGH (ref 80.0–100.0)
Platelets: 180 10*3/uL (ref 150–400)
RBC: 2.64 MIL/uL — ABNORMAL LOW (ref 3.87–5.11)
RDW: 14.7 % (ref 11.5–15.5)
WBC: 14.1 10*3/uL — ABNORMAL HIGH (ref 4.0–10.5)
nRBC: 0.4 % — ABNORMAL HIGH (ref 0.0–0.2)

## 2020-08-28 LAB — TRIGLYCERIDES: Triglycerides: 147 mg/dL (ref <150)

## 2020-08-28 MED ORDER — METHYLPREDNISOLONE SODIUM SUCC 40 MG IJ SOLR
40.0000 mg | Freq: Two times a day (BID) | INTRAMUSCULAR | Status: DC
  Administered 2020-08-28 – 2020-08-30 (×4): 40 mg via INTRAVENOUS
  Filled 2020-08-28 (×5): qty 1

## 2020-08-28 MED ORDER — INSULIN ASPART 100 UNIT/ML ~~LOC~~ SOLN: 5.0000 [IU] | SUBCUTANEOUS | Status: DC

## 2020-08-28 MED ORDER — CHLORHEXIDINE GLUCONATE 0.12 % MT SOLN
15.0000 mL | Freq: Two times a day (BID) | OROMUCOSAL | Status: DC
  Administered 2020-08-28 – 2020-08-30 (×4): 15 mL via OROMUCOSAL
  Filled 2020-08-28 (×4): qty 15

## 2020-08-28 MED ORDER — ORAL CARE MOUTH RINSE
15.0000 mL | Freq: Two times a day (BID) | OROMUCOSAL | Status: DC
  Administered 2020-08-28 – 2020-08-30 (×5): 15 mL via OROMUCOSAL

## 2020-08-28 MED ORDER — LEVOTHYROXINE SODIUM 100 MCG/5ML IV SOLN
87.5000 ug | Freq: Every day | INTRAVENOUS | Status: DC
  Administered 2020-08-29 – 2020-08-30 (×2): 87.5 ug via INTRAVENOUS
  Filled 2020-08-28 (×2): qty 5

## 2020-08-28 MED ORDER — INSULIN GLARGINE 100 UNIT/ML ~~LOC~~ SOLN
30.0000 [IU] | Freq: Every evening | SUBCUTANEOUS | Status: DC
  Filled 2020-08-28 (×3): qty 0.3

## 2020-08-28 MED ORDER — METOPROLOL TARTRATE 5 MG/5ML IV SOLN
2.5000 mg | Freq: Four times a day (QID) | INTRAVENOUS | Status: DC
  Administered 2020-08-28 – 2020-09-03 (×17): 2.5 mg via INTRAVENOUS
  Filled 2020-08-28 (×18): qty 5

## 2020-08-28 MED ORDER — ASPIRIN 81 MG PO CHEW
81.0000 mg | CHEWABLE_TABLET | Freq: Every day | ORAL | Status: DC
  Filled 2020-08-28: qty 1

## 2020-08-28 MED ORDER — HEPARIN (PORCINE) 25000 UT/250ML-% IV SOLN
1300.0000 [IU]/h | INTRAVENOUS | Status: DC
  Administered 2020-08-28 – 2020-08-29 (×2): 1200 [IU]/h via INTRAVENOUS
  Administered 2020-08-31: 900 [IU]/h via INTRAVENOUS
  Administered 2020-09-01: 1300 [IU]/h via INTRAVENOUS
  Administered 2020-09-01: 1150 [IU]/h via INTRAVENOUS
  Filled 2020-08-28 (×8): qty 250

## 2020-08-28 MED ORDER — INSULIN GLARGINE 100 UNIT/ML ~~LOC~~ SOLN
20.0000 [IU] | Freq: Every day | SUBCUTANEOUS | Status: DC
  Administered 2020-08-29 – 2020-09-01 (×4): 20 [IU] via SUBCUTANEOUS
  Filled 2020-08-28 (×4): qty 0.2

## 2020-08-28 MED ORDER — LEVETIRACETAM IN NACL 1500 MG/100ML IV SOLN
1500.0000 mg | Freq: Two times a day (BID) | INTRAVENOUS | Status: DC
  Administered 2020-08-28 – 2020-09-03 (×12): 1500 mg via INTRAVENOUS
  Filled 2020-08-28 (×12): qty 100

## 2020-08-28 NOTE — Progress Notes (Signed)
Noted MRI brain result with new acute/subacute lacunar infarct in right centrum semiovale.  Case discussed with neurology, Dr. Gerilyn Pilgrim.  This likely represents small vessel disease and with small size, ok to continue anticoagulation.  Ok to add ASA x 7 days for small vessel disease.  Check lipids and start statin if indicated and modify other risk factors.  DTat

## 2020-08-28 NOTE — Progress Notes (Signed)
Versed stopped at 2040 d/t pt's HR sustaining in the low 40s, at times dipping into the 30s. Propofol was restarted at 2050 d/t pt waking up and dyssynchronous with vent. Spoke with Dr. Rennis Petty about pt's HR and because it is not sustaining in the 40s on propofol, he is ok with prop staying on at this time, and using Levo for soft BP. Pt HR maintaining 60-70s through the night.

## 2020-08-28 NOTE — Progress Notes (Signed)
RT received orders to extubate. Audible leak test performed and was positive. AT 1350 RT extubated patient to 4L O2 via nasal cannula. Patient tolerated well, BBS diminished. Patient maintaining SATs 100%, HR 87 and RR 24. RN and daughter at bedside, RT will continue to monitor and assess.

## 2020-08-28 NOTE — Progress Notes (Signed)
NAME:  [name], MRN:  [id number], DOB:  [birth year], LOS: 4 ADMISSION DATE:  08/23/2020, CONSULTATION DATE:  08/24/2020 REFERRING MD:  Dr. Sedonia Small, ER, CHIEF COMPLAINT:  Short of breath   Brief History:  70 yo female brought to [facility] ER unresponsive after being d/c from [facility] on 08/22/20 for acute coronary syndrome.  Reported she fell at home.  Intubated in ER.   Working dx ? HCAP vs amiodarone toxicity     Past Medical History:  Anemia, Barrett's esophagus, Systolic CHF, DM type 2, A fib, HLD, HTN, Seizures, Hypothyroidism  Significant Hospital Events:  2/20 Admit 2/22 stop amiodarone (esr 95) 2/23  Solumedrol trial @ 80 q 12 and reduced to 40 mg q 12h  Pm 2/25  2/25  Trial extubation     Consults:  PCCM  2/21  Procedures:  ETT 2/20 >> 2/25  Rt IJ CVL 2/21 >>   Significant Diagnostic Tests:  CT head 2/21 >> mild atrophy, chronic microvascular disease CT chest 2/21 >> b/l ASD, small b/l effusions Cortisol fasting 2/23  = 25  Micro Data:  COVID 2/20 >> negative Flu PCR 2/20 >> negative MRSA PCR 2/20 >> negative ET 2/22 >  No wbc, few gpc >>> BC x 2 2/22 (while on cefepime)  >>> 1/2 pos strep epi  Urine strep 2/22 >> neg Urine legionella 2/22 >>> neg   Antimicrobials:  Vancomycin 2/20  X one dose    Cefepime 2/20 >>  Vancomycin 2/24 x one dose    Scheduled Meds: . apixaban  5 mg Per NG tube BID  . chlorhexidine gluconate (MEDLINE KIT)  15 mL Mouth Rinse BID  . Chlorhexidine Gluconate Cloth  6 each Topical Daily  . docusate  100 mg Per Tube BID  . donepezil  5 mg Per Tube QHS  . feeding supplement (PROSource TF)  45 mL Per Tube TID  . feeding supplement (VITAL HIGH PROTEIN)  1,000 mL Per Tube Q24H  . insulin aspart  0-15 Units Subcutaneous Q4H  . insulin aspart  5 Units Subcutaneous Q4H  . insulin glargine  30 Units Subcutaneous QPM  . levETIRAcetam  1,500 mg Per NG tube BID  . levothyroxine  175 mcg Per NG tube Daily  . LORazepam  2 mg Intravenous Once  .  losartan  25 mg Per Tube BID  . mouth rinse  15 mL Mouth Rinse 10 times per day  . methylPREDNISolone (SOLU-MEDROL) injection  80 mg Intravenous Q12H  . pantoprazole sodium  40 mg Per Tube Q24H  . polyethylene glycol  17 g Per Tube Daily   Continuous Infusions: . sodium chloride 50 mL/hr at 08/28/20 0731  . dexmedetomidine (PRECEDEX) IV infusion 0 mcg/kg/hr (08/27/20 1830)  . midazolam Stopped (08/27/20 2040)  . norepinephrine (LEVOPHED) Adult infusion 3 mcg/min (08/28/20 0731)  . piperacillin-tazobactam (ZOSYN)  IV 3.375 g (08/28/20 0631)  . propofol (DIPRIVAN) infusion 17 mcg/kg/min (08/28/20 0731)  . sodium chloride Stopped (08/24/20 0107)   PRN Meds:.acetaminophen **OR** acetaminophen, fentaNYL (SUBLIMAZE) injection, ipratropium-albuterol, ondansetron **OR** ondansetron (ZOFRAN) IV     Interim History / Subjective:  tol psv well today, trying off diprovan for trial extubation   Objective   Blood pressure (!) 145/59, pulse 82, temperature 99.7 F (37.6 C), temperature source Bladder, resp. rate 18, height _0  (1.676 m), weight 106.9 kg, SpO2 100 %. CVP:  [7 mmHg-29 mmHg] 10 mmHg  Vent Mode: CPAP FiO2 (%):  [40 %] 40 % Set Rate:  [16 bmp]  16 bmp Vt Set:  [510 mL] 510 mL PEEP:  [5 cmH20] 5 cmH20 Pressure Support:  [10 cmH20-15 cmH20] 10 cmH20 Plateau Pressure:  [19 cmH20-20 cmH20] 19 cmH20   Intake/Output Summary (Last 24 hours) at 08/28/2020 1344 Last data filed at 08/28/2020 0731 Gross per 24 hour  Intake 1714.98 ml  Output 2050 ml  Net -335.02 ml   Filed Weights   08/24/20 0139 08/24/20 0156 08/27/20 0300  Weight: 104 kg 104 kg 106.9 kg    Examination:  General  Tmax 100 Pt sedated on vent no f/c but comfortable on PSV No jvd Oropharynx et  Neck supple Lungs with distant bs bilaterally IRIR  no s3 or or sign murmur Abd obese with limited excursion  Extr warm with trace pitting  Lower ext    I personally reviewed images and agree with radiology  impression as follows:  CXR:  2/25  portable  Tube and catheter positions as described without pneumothorax. Persistent left lower lobe airspace consolidation, concerning combination of atelectasis and pneumonia with small left pleural effusion. Mild bibasilar interstitial edema. Stable cardiomegaly. Aortic Atherosclerosis (ICD10-I70.0). My review:   Overall aeration ok/ LLL atx likely      .    Resolved Hospital Problem list     Assessment & Plan:   Acute hypoxic respiratory failure from bacterial HCAP vs amio toxicity - adjust PEEP/FiO2 to keep SpO2 90 to 95% - see micro flowsheet  rec  Started steroid trial 2/23 >>> reduced to 40 mg IV q 12h pm 2/25     Septic shock from pneumonia. - pressors to keep MAP > 65 - see micro flowsheet  - off pressors am 2/23 with cvp adequate   Acute metabolic encephalopathy from sepsis and hypoxia. Hx of seizures, neuropathy. - continue keppra - holding outpt aricept, cymbalta, neurontin, ativan    AKI from ATN 2nd to sepsis. CKD 3a. Lab Results  Component Value Date   CREATININE 1.22 (H) 08/28/2020   CREATININE 1.16 (H) 08/27/2020   CREATININE 0.96 08/26/2020   -baseline creatinine 1.40 from 04/08/20 - f/u BMET, monitor urine outpt  Chronic a fib, chronic diastolic CHF, HLD. - continue eliquis  - hold outpt torsemide - amiodarone d/c'd 2/22 with ESR 95 - added low dose losartan pm 2/24 and changed diprovan to precedex for weaning purposes but got bradycardic > back on diprovan am 2/25     DM type 2 poorly controlled with hyperglycemia. Hx of hypothyroidism. - SSI - continue levothyroxine - holding  outpt glucotrol, metformin  Anemia of chronic disease and critical illness. Lab Results  Component Value Date   HGB 8.7 (L) 08/28/2020   HGB 8.2 (L) 08/27/2020   HGB 8.8 (L) 08/26/2020   - f/u CBC - transfuse for Hb < 7 or significant bleeding  Hypernatremia  - changed fluids to 1/2 NS 2/23 >improved 2/24   Best  practice (evaluated daily)  Diet: tube feeds DVT prophylaxis: eliquis GI prophylaxis: protonix Mobility: bed rest Disposition: ICU Code Status: full code  Daughter [name] at bedside am 2/25 updated  Labs    CMP Latest Ref Rng & Units 08/28/2020 08/27/2020 08/26/2020  Glucose 70 - 99 mg/dL 284(H) 352(H) 170(H)  BUN 8 - 23 mg/dL 50(H) 38(H) 28(H)  Creatinine 0.44 - 1.00 mg/dL 1.22(H) 1.16(H) 0.96  Sodium 135 - 145 mmol/L 145 144 147(H)  Potassium 3.5 - 5.1 mmol/L 3.5 3.8 3.2(L)  Chloride 98 - 111 mmol/L 110 109 109  CO2 22 - 32 mmol/L  _0 Calcium 8.9 - 10.3 mg/dL 9.0 8.6(L) 8.7(L)  Total Protein 6.5 - 8.1 g/dL 6.2(L) 5.8(L) 5.8(L)  Total Bilirubin 0.3 - 1.2 mg/dL 0.3 0.8 0.8  Alkaline Phos 38 - 126 U/L 49 53 56  AST 15 - 41 U/L 14(L) 14(L) 19  ALT 0 - 44 U/L _1 CBC Latest Ref Rng & Units 08/28/2020 08/27/2020 08/26/2020  WBC 4.0 - 10.5 K/uL 14.1(H) 7.4 11.2(H)  Hemoglobin 12.0 - 15.0 g/dL 8.7(L) 8.2(L) 8.8(L)  Hematocrit 36.0 - 46.0 % 28.1(L) 26.7(L) 28.1(L)  Platelets 150 - 400 K/uL 180 153 168    ABG    Component Value Date/Time   PHART 7.544 (H) 08/26/2020 0435   PCO2ART 31.9 (L) 08/26/2020 0435   PO2ART 157 (H) 08/26/2020 0435   HCO3 29.1 (H) 08/26/2020 0435   TCO2 27 01/26/2015 1132   ACIDBASEDEF 1.0 01/26/2015 1132   O2SAT 99.3 08/26/2020 0435    CBG (last 3)  Recent Labs    08/28/20 0439 08/28/20 0750 08/28/20 1130  GLUCAP 366* 256* 130*     The patient is critically ill with multiple organ systems failure and requires high complexity decision making for assessment and support, frequent evaluation and titration of therapies, application of advanced monitoring technologies and extensive interpretation of multiple databases. Critical Care Time devoted to patient care services described in this note is 35 minutes.   Christinia Gully, MD Pulmonary and [place] [phone]   After 7:00 pm call Elink   [phone]

## 2020-08-28 NOTE — Progress Notes (Signed)
PROGRESS NOTE  [name] ALP:[id number] DOB: December 29, 1950 DOA: 08/23/2020 PCP: Lemmie Evens, MD Brief History:  70 year old female admitted to the hospital with acute respiratory failure secondary to possible pneumonia. Patient was intubated in the emergency room. She was noted to be in septic shock and was started on vasopressors and antibiotics. History is further complicated by recent diagnosis of cardiomyopathy and ejection fraction 15 to 20%. She also has atrial fibrillation on amiodarone and Eliquis. PCCM following. Patient recently hospitalized at [facility] with ACS/NSTEMI from 08/17/20 to 08/21/20. No evidence of ischemic cardiomyopathy based on left heart catheterization 2/15. Patient was switched to PO diuretics (torsemide 100 mg bid) on 2/17, after improvement in volume status. Beta-blockers were not introduced on discharge since patient has had moments of hypotension. She was d/ced on hydralazine 25 mg tid, Jardiance 10 mg daily, and torsemide 100 mg daily. She was continued on amio 200 mg daily. She was d/ced back on her usual 2L [place]. It was felt her CM was due to uncontrolled HTN and afib with RR.   Assessment/Plan: Acuteon chronicrespiratory failure with hypoxia and hypercapnia -Secondary to pneumoniain the setting of underlying OSA -Patient was intubated on 2/20 and is on mechanical ventilation -Appreciate PCCM assistance -Other etiologies to be considered as amiodarone toxicity -started IV solumedrol 2/23 -weaning off diprivan in anticipation for weaning vent -2/25--personally reviewed CXR--LLL opacity, increased interstitial markings at bases  Septic shock -Secondary to pneumonia -MRSA PCR is negative, vancomycin discontinued -Unfortunately, she is continued to have fevers -Blood cultures have been sent -D/C cefepime -start zosyn 2/24 -Weaned off Levophed 2/23  Bacteremia -contaminant -Staph epi in one of two sets -d/c  vanc  chronicsystolic congestive heart failure -Recent echocardiogram at [facility] showed ejection fraction of 15 to 20% -No evidence of ischemic cardiomyopathy based on heart catheterization on 2/15 at [facility] -She was discharged from [facility] on torsemide 100 mg twice daily -Currently, CVP is7-9and blood pressures of also been low, will hold off on resuming diuretics at this time.  Bradycardia/Afib with Slow ventricular response -Patient noted to have episodes of bradycardia with pauses -EKG shows prolonged QT interval, and diffuse T wave inversions -hold off on further amiodarone -08/17/20 Echo--EF 15-20%, mod TR, severe elevated PASP -Replace electrolytes -bradycardia partly due to precedex, diprivan -If heart rate sustains bradycardic, may need to consider continuing on low-dose dobutamine/Levophed. 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  chronic kidney disease stage IIIa -Baseline creatinine approximately1.1-1.4 -Continue to monitor  Chronic atrial fibrillation -Anticoagulated with Eliquis -She was on amiodarone -She is having episodes of bradycardia, will hold off on further amio for now; also concerned about amio pulm toxicity  DiabetesMellitus type 2 -Blood sugars elevated -08/17/20 A1C--7.2 -Increase Lantus to 30 units -Continue on sliding scale--increase to moderate scale -holding metformin and glipizide -add novolog 5 units q 4 hours  FEN -hypokalemia-->repleted -check Mag-2.3 -continue enteral feeding at 40cc /hr -1/2NS started by PCCM  Hypothyroidism -Continue on Synthroid -TSH 0.638  Anemia of chronic disease -Baseline Hgb ~10 -Transfuse for hemoglobin less than 7 or if she has significant bleeding -Hgb now stable ~8, likely dilution  History of seizures -Continue on Keppra  Dementiawithout behavioral disturbance -Continue Aricept       Status is: Inpatient  Remains inpatient appropriate due to IV therapy and hemodynamic  instability  Dispo: The patient is from:Home Anticipated d/c is DZ:HGDJ Anticipated d/c date is: 3 days Patient currently is not medically stable to  d/c. Difficult to place patient No        Family Communication:Daughter updatedat bedside 2/25  Consultants:PCCM  Code Status: FULL  DVT Prophylaxis:apixaban  Procedures: As Listed in Progress Note Above  Antibiotics: Cefepime 2/21>>2/24 Zosyn 2/24>>>       Subjective: Pt remains on sedation on vent.  No respiratory distress.  Tolerating TF.  No vomiting or diarrhea or distress  Objective: Vitals:   08/28/20 0752 08/28/20 0800 08/28/20 0900 08/28/20 1122  BP:  136/71 (!) 145/59   Pulse: 65 73 77 82  Resp: '20 19 18 18  ' Temp: 100 F (37.8 C)   99.7 F (37.6 C)  TempSrc: Bladder   Bladder  SpO2: 100% 100% 100% 100%  Weight:      Height:   '5\' 6"'  (1.676 m)     Intake/Output Summary (Last 24 hours) at 08/28/2020 1147 Last data filed at 08/28/2020 0731 Gross per 24 hour  Intake 1714.98 ml  Output 2050 ml  Net -335.02 ml   Weight change:  Exam:   General:  Pt is alert, follows commands appropriately, not in acute distress  HEENT: No icterus, No thrush, No neck mass, [place]/AT  Cardiovascular: IRRR, S1/S2, no rubs, no gallops  Respiratory: bibasilar rales. No wheeze  Abdomen: Soft/+BS, non tender, non distended, no guarding  Extremities: Non pitting edema, No lymphangitis, No petechiae, No rashes, no synovitis   Data Reviewed: I have personally reviewed following labs and imaging studies Basic Metabolic Panel: Recent Labs  Lab 08/23/20 2305 08/24/20 1002 08/25/20 0425 08/25/20 1656 08/26/20 0422 08/27/20 0410  NA 142 144 142  --  147* 144  K 4.8 3.9 3.4* 3.3* 3.2* 3.8  CL 100 98 100  --  109 109  CO2 34* 30 30  --  27 24  GLUCOSE 215* 248* 320*  --  170* 352*  BUN 39* 39* 37*  --  28* 38*  CREATININE 2.15* 2.00*  1.56*  --  0.96 1.16*  CALCIUM 8.9 9.0 8.6*  --  8.7* 8.6*  MG  --   --  2.4  --   --  2.3  PHOS  --   --  2.8  --   --   --    Liver Function Tests: Recent Labs  Lab 08/23/20 2305 08/26/20 0422 08/27/20 0410  AST 31 19 14*  ALT '28 20 17  ' ALKPHOS 71 56 53  BILITOT 0.5 0.8 0.8  PROT 6.6 5.8* 5.8*  ALBUMIN 3.4* 2.4* 2.2*   No results for input(s): LIPASE, AMYLASE in the last 168 hours. No results for input(s): AMMONIA in the last 168 hours. Coagulation Profile: Recent Labs  Lab 08/23/20 2305  INR 1.8*   CBC: Recent Labs  Lab 08/23/20 2305 08/25/20 0726 08/26/20 0422 08/27/20 0410 08/28/20 1128  WBC 11.4* 13.0* 11.2* 7.4 14.1*  HGB 10.4* 9.2* 8.8* 8.2* 8.7*  HCT 36.4 29.2* 28.1* 26.7* 28.1*  MCV 115.2* 102.8* 103.7* 106.8* 106.4*  PLT 234 191 168 153 180   Cardiac Enzymes: Recent Labs  Lab 08/23/20 2305  CKTOTAL 49   BNP: Invalid input(s): POCBNP CBG: Recent Labs  Lab 08/27/20 2034 08/27/20 2354 08/28/20 0439 08/28/20 0750 08/28/20 1130  GLUCAP 359* 337* 366* 256* 130*   HbA1C: No results for input(s): HGBA1C in the last 72 hours. Urine analysis:    Component Value Date/Time   COLORURINE AMBER (A) 08/23/2020 2346   APPEARANCEUR HAZY (A) 08/23/2020 2346   LABSPEC 1.011 08/23/2020 2346   PHURINE 5.0  08/23/2020 2346   GLUCOSEU >=500 (A) 08/23/2020 2346   HGBUR NEGATIVE 08/23/2020 2346   BILIRUBINUR NEGATIVE 08/23/2020 2346   KETONESUR NEGATIVE 08/23/2020 2346   PROTEINUR NEGATIVE 08/23/2020 2346   UROBILINOGEN 0.2 02/05/2015 1700   NITRITE NEGATIVE 08/23/2020 2346   LEUKOCYTESUR NEGATIVE 08/23/2020 2346   Sepsis Labs: '@[email or web address]' (procalcitonin:4,lacticidven:4) ) Recent Results (from the past 240 hour(s))  Resp Panel by RT-PCR (Flu A&B, Covid) Nasopharyngeal Swab     Status: None   Collection Time: 08/23/20 11:45 PM   Specimen: Nasopharyngeal Swab; Nasopharyngeal(NP) swabs in vial transport medium  Result Value Ref Range Status   SARS  Coronavirus 2 by RT PCR NEGATIVE NEGATIVE Final    Comment: (NOTE) SARS-CoV-2 target nucleic acids are NOT DETECTED.  The SARS-CoV-2 RNA is generally detectable in upper respiratory specimens during the acute phase of infection. The lowest concentration of SARS-CoV-2 viral copies this assay can detect is 138 copies/mL. A negative result does not preclude SARS-Cov-2 infection and should not be used as the sole basis for treatment or other patient management decisions. A negative result may occur with  improper specimen collection/handling, submission of specimen other than nasopharyngeal swab, presence of viral mutation(s) within the areas targeted by this assay, and inadequate number of viral copies(<138 copies/mL). A negative result must be combined with clinical observations, patient history, and epidemiological information. The expected result is Negative.  Fact Sheet for Patients:  EntrepreneurPulse.com.au  Fact Sheet for Healthcare Providers:  IncredibleEmployment.be  This test is no t yet approved or cleared by the [place] FDA and  has been authorized for detection and/or diagnosis of SARS-CoV-2 by FDA under an Emergency Use Authorization (EUA). This EUA will remain  in effect (meaning this test can be used) for the duration of the COVID-19 declaration under Section 564(b)(1) of the Act, 21 U.S.C.section 360bbb-3(b)(1), unless the authorization is terminated  or revoked sooner.       Influenza A by PCR NEGATIVE NEGATIVE Final   Influenza B by PCR NEGATIVE NEGATIVE Final    Comment: (NOTE) The Xpert Xpress SARS-CoV-2/FLU/RSV plus assay is intended as an aid in the diagnosis of influenza from Nasopharyngeal swab specimens and should not be used as a sole basis for treatment. Nasal washings and aspirates are unacceptable for Xpert Xpress SARS-CoV-2/FLU/RSV testing.  Fact Sheet for  Patients: EntrepreneurPulse.com.au  Fact Sheet for Healthcare Providers: IncredibleEmployment.be  This test is not yet approved or cleared by the [place] FDA and has been authorized for detection and/or diagnosis of SARS-CoV-2 by FDA under an Emergency Use Authorization (EUA). This EUA will remain in effect (meaning this test can be used) for the duration of the COVID-19 declaration under Section 564(b)(1) of the Act, 21 U.S.C. section 360bbb-3(b)(1), unless the authorization is terminated or revoked.  Performed at [facility], [address], [place], [place] [address]   MRSA PCR Screening     Status: None   Collection Time: 08/24/20  9:57 AM   Specimen: Nasal Mucosa; Nasopharyngeal  Result Value Ref Range Status   MRSA by PCR NEGATIVE NEGATIVE Final    Comment:        The GeneXpert MRSA Assay (FDA approved for NASAL specimens only), is one component of a comprehensive MRSA colonization surveillance program. It is not intended to diagnose MRSA infection nor to guide or monitor treatment for MRSA infections. Performed at [facility], [address] [place], [place] [address]   Culture, blood (routine x 2)     Status: None (Preliminary result)  Collection Time: 08/25/20  1:55 PM   Specimen: BLOOD RIGHT HAND  Result Value Ref Range Status   Specimen Description   Final    BLOOD RIGHT HAND Performed at [facility], [address] [place], [place] [address]    Special Requests   Final    BOTTLES DRAWN AEROBIC AND ANAEROBIC Blood Culture adequate volume Performed at [facility], [address], [place], [place] [address]    Culture  Setup Time   Final    GRAM POSITIVE COCCI AEROBIC BOTTLE Gram Stain Report Called to,Read Back By and Verified With: LOOMIS,K. [email or web address]'  08/27/20 BILLINGSLEY,L Organism ID to follow CRITICAL RESULT CALLED TO, READ BACK BY AND VERIFIED WITH: Quentin Angst RN 08/28/20 0355 JDW Performed at [place], [place] [address] [place], [place] [address]    Culture GRAM POSITIVE COCCI  Final   Report Status PENDING  Incomplete  Culture, blood (routine x 2)     Status: None (Preliminary result)   Collection Time: 08/25/20  1:55 PM   Specimen: BLOOD  Result Value Ref Range Status   Specimen Description BLOOD RIGHT ANTECUBITAL  Final   Special Requests   Final    BOTTLES DRAWN AEROBIC AND ANAEROBIC Blood Culture adequate volume   Culture   Final    NO GROWTH 3 DAYS Performed at [facility], [address], [place], [place] [address]    Report Status PENDING  Incomplete  Blood Culture ID Panel (Reflexed)     Status: Abnormal   Collection Time: 08/25/20  1:55 PM  Result Value Ref Range Status   Enterococcus faecalis NOT DETECTED NOT DETECTED Final   Enterococcus Faecium NOT DETECTED NOT DETECTED Final   Listeria monocytogenes NOT DETECTED NOT DETECTED Final   Staphylococcus species DETECTED (A) NOT DETECTED Final    Comment: CRITICAL RESULT CALLED TO, READ BACK BY AND VERIFIED WITH: H TETREAULT RN 08/28/20 0355 JDW    Staphylococcus aureus (BCID) NOT DETECTED NOT DETECTED Final   Staphylococcus epidermidis DETECTED (A) NOT DETECTED Final    Comment: Methicillin (oxacillin) resistant coagulase negative staphylococcus. Possible blood culture contaminant (unless isolated from more than one blood culture draw or clinical case suggests pathogenicity). No antibiotic treatment is indicated for blood  culture contaminants. CRITICAL RESULT CALLED TO, READ BACK BY AND VERIFIED WITH: H TETREAULT RN 08/28/20 0355 JDW    Staphylococcus lugdunensis NOT DETECTED NOT DETECTED Final   Streptococcus species NOT DETECTED NOT DETECTED Final   Streptococcus agalactiae NOT DETECTED NOT DETECTED Final   Streptococcus pneumoniae NOT DETECTED NOT DETECTED Final   Streptococcus pyogenes NOT DETECTED NOT DETECTED Final   A.calcoaceticus-baumannii NOT DETECTED NOT DETECTED Final   Bacteroides fragilis NOT DETECTED NOT  DETECTED Final   Enterobacterales NOT DETECTED NOT DETECTED Final   Enterobacter cloacae complex NOT DETECTED NOT DETECTED Final   Escherichia coli NOT DETECTED NOT DETECTED Final   Klebsiella aerogenes NOT DETECTED NOT DETECTED Final   Klebsiella oxytoca NOT DETECTED NOT DETECTED Final   Klebsiella pneumoniae NOT DETECTED NOT DETECTED Final   Proteus species NOT DETECTED NOT DETECTED Final   Salmonella species NOT DETECTED NOT DETECTED Final   Serratia marcescens NOT DETECTED NOT DETECTED Final   Haemophilus influenzae NOT DETECTED NOT DETECTED Final   Neisseria meningitidis NOT DETECTED NOT DETECTED Final   Pseudomonas aeruginosa NOT DETECTED NOT DETECTED Final   Stenotrophomonas maltophilia NOT DETECTED NOT DETECTED Final   Candida albicans NOT DETECTED NOT DETECTED Final   Candida auris NOT DETECTED NOT  DETECTED Final   Candida glabrata NOT DETECTED NOT DETECTED Final   Candida krusei NOT DETECTED NOT DETECTED Final   Candida parapsilosis NOT DETECTED NOT DETECTED Final   Candida tropicalis NOT DETECTED NOT DETECTED Final   Cryptococcus neoformans/gattii NOT DETECTED NOT DETECTED Final   Methicillin resistance mecA/C DETECTED (A) NOT DETECTED Final    Comment: CRITICAL RESULT CALLED TO, READ BACK BY AND VERIFIED WITH: H TETREAULT RN 08/28/20 0355 JDW Performed at [facility], [address], [place], [place] [address]   Culture, Respiratory w Gram Stain     Status: None   Collection Time: 08/25/20  2:36 PM   Specimen: Tracheal Aspirate; Respiratory  Result Value Ref Range Status   Specimen Description   Final    TRACHEAL ASPIRATE Performed at [facility], [address], [place], [place] [address]    Special Requests   Final    NONE Performed at [facility], [address], [place], [place] [address]    Gram Stain   Final    NO WBC SEEN FEW GRAM POSITIVE COCCI Performed at [facility], [place] [address] [place], [place] [address]    Culture ABUNDANT  ENTEROCOCCUS FAECALIS  Final   Report Status 08/27/2020 FINAL  Final   Organism ID, Bacteria ENTEROCOCCUS FAECALIS  Final      Susceptibility   Enterococcus faecalis - MIC*    AMPICILLIN <=2 SENSITIVE Sensitive     VANCOMYCIN 1 SENSITIVE Sensitive     GENTAMICIN SYNERGY SENSITIVE Sensitive     * ABUNDANT ENTEROCOCCUS FAECALIS     Scheduled Meds: . apixaban  5 mg Per NG tube BID  . chlorhexidine gluconate (MEDLINE KIT)  15 mL Mouth Rinse BID  . Chlorhexidine Gluconate Cloth  6 each Topical Daily  . docusate  100 mg Per Tube BID  . donepezil  5 mg Per Tube QHS  . feeding supplement (PROSource TF)  45 mL Per Tube TID  . feeding supplement (VITAL HIGH PROTEIN)  1,000 mL Per Tube Q24H  . insulin aspart  0-15 Units Subcutaneous Q4H  . insulin glargine  20 Units Subcutaneous Daily  . levETIRAcetam  1,500 mg Per NG tube BID  . levothyroxine  175 mcg Per NG tube Daily  . LORazepam  2 mg Intravenous Once  . losartan  25 mg Per Tube BID  . mouth rinse  15 mL Mouth Rinse 10 times per day  . methylPREDNISolone (SOLU-MEDROL) injection  80 mg Intravenous Q12H  . pantoprazole sodium  40 mg Per Tube Q24H  . polyethylene glycol  17 g Per Tube Daily   Continuous Infusions: . sodium chloride 50 mL/hr at 08/28/20 0731  . dexmedetomidine (PRECEDEX) IV infusion 0 mcg/kg/hr (08/27/20 1830)  . midazolam Stopped (08/27/20 2040)  . norepinephrine (LEVOPHED) Adult infusion 3 mcg/min (08/28/20 0731)  . piperacillin-tazobactam (ZOSYN)  IV 3.375 g (08/28/20 0631)  . propofol (DIPRIVAN) infusion 17 mcg/kg/min (08/28/20 0731)  . sodium chloride Stopped (08/24/20 0107)  . vancomycin      Procedures/Studies: CT ABDOMEN PELVIS WO CONTRAST  Result Date: 08/24/2020 CLINICAL DATA:  Status post fall, unresponsive. EXAM: CT CHEST, ABDOMEN AND PELVIS WITHOUT CONTRAST TECHNIQUE: Multidetector CT imaging of the chest, abdomen and pelvis was performed following the standard protocol without IV contrast. COMPARISON:   None. FINDINGS: CT CHEST FINDINGS Cardiovascular: A right internal jugular venous catheter is in place. There is moderate severity calcification of the aortic arch. There is moderate severity cardiomegaly. No pericardial effusion. Mediastinum/Nodes:  No enlarged mediastinal, hilar, or axillary lymph nodes. Thyroid gland, trachea, and esophagus demonstrate no significant findings. Lungs/Pleura: Endotracheal and nasogastric tubes are in place. Moderate severity left upper lobe and bilateral lower lobe atelectasis and/or infiltrate is noted. Small bilateral pleural effusions are noted, right greater than left. No pneumothorax is identified. Musculoskeletal: There is evidence of prior vertebroplasty at the level of T12. A chronic fracture deformity of the sternum is seen. Multilevel degenerative changes are noted throughout the thoracic spine. CT ABDOMEN PELVIS FINDINGS Hepatobiliary: No focal liver abnormality is seen. Status post cholecystectomy. No biliary dilatation. Pancreas: Unremarkable. No pancreatic ductal dilatation or surrounding inflammatory changes. Spleen: Normal in size without focal abnormality. Adrenals/Urinary Tract: Adrenal glands are unremarkable. Kidneys are normal, without renal calculi, focal lesion, or hydronephrosis. A Foley catheter is seen within the urinary bladder. Stomach/Bowel: A nasogastric tube is seen with its distal tip noted within the proximal duodenum. The stomach is otherwise within normal limits. The appendix is not clearly visualized. No evidence of bowel wall thickening, distention, or inflammatory changes. Noninflamed diverticula are seen throughout the sigmoid colon. Vascular/Lymphatic: Aortic atherosclerosis. No enlarged abdominal or pelvic lymph nodes. Reproductive: Status post hysterectomy. No adnexal masses. Other: There is a 2.5 cm x 4.5 cm fat containing umbilical hernia. No abdominopelvic ascites. Musculoskeletal: Multilevel degenerative changes seen throughout the  lumbar spine. IMPRESSION: 1. Moderate severity left upper lobe and bilateral lower lobe atelectasis and/or infiltrate. 2. Small bilateral pleural effusions, right greater than left. 3. Moderate severity cardiomegaly. 4. Evidence of prior vertebroplasty at the level of T12. 5. Sigmoid diverticulosis. 6. Fat-containing umbilical hernia. 7. Aortic atherosclerosis. Aortic Atherosclerosis (ICD10-I70.0). Electronically Signed   By: Virgina Norfolk M.D.   On: 08/24/2020 00:39   CT HEAD WO CONTRAST  Result Date: 08/24/2020 CLINICAL DATA:  Status post fall, unresponsive. EXAM: CT HEAD WITHOUT CONTRAST TECHNIQUE: Contiguous axial images were obtained from the base of the skull through the vertex without intravenous contrast. COMPARISON:  August 15, 2020 FINDINGS: Brain: There is mild cerebral atrophy with widening of the extra-axial spaces and ventricular dilatation. There are areas of decreased attenuation within the white matter tracts of the supratentorial brain, consistent with microvascular disease changes. Vascular: No hyperdense vessel or unexpected calcification. Skull: Normal. Negative for fracture or focal lesion. Sinuses/Orbits: No acute finding. Other: Endotracheal and orogastric tubes are in place. IMPRESSION: No acute intracranial pathology. Electronically Signed   By: Virgina Norfolk M.D.   On: 08/24/2020 00:30   CT Chest Wo Contrast  Result Date: 08/24/2020 CLINICAL DATA:  Status post fall, unresponsive. EXAM: CT CHEST, ABDOMEN AND PELVIS WITHOUT CONTRAST TECHNIQUE: Multidetector CT imaging of the chest, abdomen and pelvis was performed following the standard protocol without IV contrast. COMPARISON:  None. FINDINGS: CT CHEST FINDINGS Cardiovascular: A right internal jugular venous catheter is in place. There is moderate severity calcification of the aortic arch. There is moderate severity cardiomegaly. No pericardial effusion. Mediastinum/Nodes: No enlarged mediastinal, hilar, or axillary lymph  nodes. Thyroid gland, trachea, and esophagus demonstrate no significant findings. Lungs/Pleura: Endotracheal and nasogastric tubes are in place. Moderate severity left upper lobe and bilateral lower lobe atelectasis and/or infiltrate is noted. Small bilateral pleural effusions are noted, right greater than left. No pneumothorax is identified. Musculoskeletal: There is evidence of prior vertebroplasty at the level of T12. A chronic fracture deformity of the sternum is seen. Multilevel degenerative changes are noted throughout the thoracic spine. CT ABDOMEN PELVIS FINDINGS Hepatobiliary: No focal liver abnormality is seen. Status post cholecystectomy. No  biliary dilatation. Pancreas: Unremarkable. No pancreatic ductal dilatation or surrounding inflammatory changes. Spleen: Normal in size without focal abnormality. Adrenals/Urinary Tract: Adrenal glands are unremarkable. Kidneys are normal, without renal calculi, focal lesion, or hydronephrosis. A Foley catheter is seen within the urinary bladder. Stomach/Bowel: A nasogastric tube is seen with its distal tip noted within the proximal duodenum. The stomach is otherwise within normal limits. The appendix is not clearly visualized. No evidence of bowel wall thickening, distention, or inflammatory changes. Noninflamed diverticula are seen throughout the sigmoid colon. Vascular/Lymphatic: Aortic atherosclerosis. No enlarged abdominal or pelvic lymph nodes. Reproductive: Status post hysterectomy. No adnexal masses. Other: There is a 2.5 cm x 4.5 cm fat containing umbilical hernia. No abdominopelvic ascites. Musculoskeletal: Multilevel degenerative changes seen throughout the lumbar spine. IMPRESSION: 1. Moderate severity left upper lobe and bilateral lower lobe atelectasis and/or infiltrate. 2. Small bilateral pleural effusions, right greater than left. 3. Moderate severity cardiomegaly. 4. Evidence of prior vertebroplasty at the level of T12. 5. Sigmoid diverticulosis. 6.  Fat-containing umbilical hernia. 7. Aortic atherosclerosis. Aortic Atherosclerosis (ICD10-I70.0). Electronically Signed   By: Virgina Norfolk M.D.   On: 08/24/2020 00:40   CT CERVICAL SPINE WO CONTRAST  Result Date: 08/24/2020 CLINICAL DATA:  Status post fall, unresponsive. EXAM: CT CERVICAL SPINE WITHOUT CONTRAST TECHNIQUE: Multidetector CT imaging of the cervical spine was performed without intravenous contrast. Multiplanar CT image reconstructions were also generated. COMPARISON:  October 04, 2012 FINDINGS: Alignment: Normal. Skull base and vertebrae: No acute fracture. No primary bone lesion or focal pathologic process. Soft tissues and spinal canal: No prevertebral fluid or swelling. No visible canal hematoma. Disc levels: Marked severity endplate sclerosis and osteophyte formation is seen at the levels of C3-C4, C4-C5, C5-C6 and C6-C7. There is marked severity narrowing of the anterior atlantoaxial articulation. Mild to moderate severity intervertebral disc space narrowing is seen at the levels of C5-C6 and C6-C7. Moderate severity bilateral multilevel facet joint hypertrophy is noted. Upper chest: Negative. Other: Endotracheal and orogastric tubes are noted. IMPRESSION: 1. No acute osseous abnormality. 2. Marked severity multilevel degenerative changes, most prominent at the levels of C3-C4, C4-C5, C5-C6 and C6-C7. Electronically Signed   By: Virgina Norfolk M.D.   On: 08/24/2020 00:32   DG Chest Port 1 View  Result Date: 08/28/2020 CLINICAL DATA:  Hypoxia EXAM: PORTABLE CHEST 1 VIEW COMPARISON:  August 26, 2020 chest radiograph and August 23, 2020 chest CT FINDINGS: Endotracheal tube tip is 3.8 cm above the carina. Nasogastric tube tip and side port are below the diaphragm. Central catheter tip is at the cavoatrial junction. No pneumothorax. There is airspace consolidation in the left base with small left pleural effusion. There is mild interstitial edema in the lower lung regions. There is  cardiomegaly with pulmonary vascularity within normal limits. There is aortic atherosclerosis. No adenopathy. No bone lesions IMPRESSION: Tube and catheter positions as described without pneumothorax. Persistent left lower lobe airspace consolidation, concerning combination of atelectasis and pneumonia with small left pleural effusion. Mild bibasilar interstitial edema. Stable cardiomegaly. Aortic Atherosclerosis (ICD10-I70.0). Electronically Signed   By: Lowella Grip III M.D.   On: 08/28/2020 07:47   DG CHEST PORT 1 VIEW  Result Date: 08/26/2020 CLINICAL DATA:  Intubation. EXAM: PORTABLE CHEST 1 VIEW COMPARISON:  08/25/2020. FINDINGS: Endotracheal tube, NG tube, right IJ line in stable position. Stable cardiomegaly. Low lung volumes persistent left base atelectasis/infiltrate. Stable mild bilateral interstitial prominence. Small left pleural effusion again noted. No pneumothorax. IMPRESSION: 1. Lines and tubes in stable position.  2. Low lung volumes. Persistent left base atelectasis/infiltrate and small left pleural effusion. Stable mild bilateral interstitial prominence. 3. Stable cardiomegaly. Electronically Signed   By: Marcello Moores  Register   On: 08/26/2020 05:40   DG Chest Port 1 View  Result Date: 08/25/2020 CLINICAL DATA:  Respiratory failure. EXAM: PORTABLE CHEST 1 VIEW COMPARISON:  Chest x-ray 08/23/2020. FINDINGS: Endotracheal tube, NG tube in stable position. Right IJ line in unchanged position with tip in the right atrium. Cardiomegaly with bilateral interstitial prominence suggesting interstitial edema. Left base atelectasis/infiltrate. No prominent pleural effusion. No pneumothorax. IMPRESSION: 1. Endotracheal tube, NG tube in stable position. Right IJ line in unchanged position with tip in the right atrium 2. Cardiomegaly with bilateral interstitial prominence suggesting interstitial edema. 3.  Left base atelectasis/infiltrate. Electronically Signed   By: Marcello Moores  Register   On: 08/25/2020  05:27   DG Chest Portable 1 View  Result Date: 08/23/2020 CLINICAL DATA:  Status post intubation. EXAM: PORTABLE CHEST 1 VIEW COMPARISON:  August 17, 2020 FINDINGS: An endotracheal tube is seen with its distal tip approximately 2.1 cm from the carina. A nasogastric tube is noted with its distal end seen within the expected region of the gastric antrum. There is a right internal jugular venous catheter. Its distal tip is seen within the right atrium, approximately 5.2 cm distal to the junction of the superior vena cava and right atrium. Mild atelectasis and/or infiltrate is noted within the right lung base. There is no evidence of a pleural effusion or pneumothorax. The cardiac silhouette is markedly enlarged. The visualized skeletal structures are unremarkable. IMPRESSION: 1. Interval endotracheal tube, nasogastric tube and right internal jugular venous catheter placement and positioning, as described above. 2. Mild right basilar atelectasis and/or infiltrate. Electronically Signed   By: Virgina Norfolk M.D.   On: 08/23/2020 23:56    Orson Eva, DO  [facility]  If 7PM-7AM, please contact night-coverage [email or web address] Password [facility] 08/28/2020, 11:47 AM   LOS: 4 days

## 2020-08-28 NOTE — Progress Notes (Signed)
Inpatient Diabetes Program Recommendations  AACE/ADA: New Consensus Statement on Inpatient Glycemic Control   Target Ranges:  Prepandial:   less than 140 mg/dL      Peak postprandial:   less than 180 mg/dL (1-2 hours)      Critically ill patients:  140 - 180 mg/dL   Results for [name], [name] (MRN [id number]) as of 08/28/2020 10:18  Ref. Range 08/27/2020 08:04 08/27/2020 11:26 08/27/2020 16:47 08/27/2020 20:34 08/27/2020 23:54 08/28/2020 04:39 08/28/2020 07:50  Glucose-Capillary Latest Ref Range: 70 - 99 mg/dL 259 (H) 563 (H) 875 (H) 359 (H) 337 (H) 366 (H) 256 (H)   Review of Glycemic Control  Diabetes history: DM2 Outpatient Diabetes medications: Lantus 20-30 units QHS, Metformin 500 mg BID, Glipizide 10 mg daily Current orders for Inpatient glycemic control: Lantus 20 units daily, Novolog 0-15 units Q4H; Solumedrol 80 mg Q12H, Vital @ 40 ml/hr  Inpatient Diabetes Program Recommendations:    Insulin: If steroids and tube feeding continued as ordered, please consider increasing Lantus to 30 units daily to start 08/29/20 (also order Lantus 10 units x 1 today since patient has already received Lantus 20 units today) and Novolog 5 units Q4H for tube feeding coverage.   Thanks, Orlando Penner, RN, MSN, CDE Diabetes Coordinator Inpatient Diabetes Program [phone] (Team Pager from 8am to 5pm)

## 2020-08-28 NOTE — Progress Notes (Signed)
ANTICOAGULATION CONSULT NOTE - Initial Consult  Pharmacy Consult for Heparin Indication: atrial fibrillation  Allergies  Allergen Reactions  . Citalopram Hydrobromide Other (See Comments)    Dyskinesia Other reaction(s): Other (See Comments) Dyskinesia  . Codeine Nausea And Vomiting and Other (See Comments)    HALLUCINATIONS Other reaction(s): Unknown Other reaction(s): Other (See Comments) HALLUCINATIONS   . Hydromorphone Hcl Other (See Comments)    Made her pass out Other reaction(s): Other (See Comments) Made her pass out  . Metoclopramide Other (See Comments)    DYSKINESIA Other reaction(s): Other (See Comments) DYSKINESIA  . Hyoscyamine     MADE DIARRHEA WORSE   Patient Measurements: Height: 5\' 6"  (167.6 cm) Weight: 106.9 kg (235 lb 10.8 oz) IBW/kg (Calculated) : 59.3 HEPARIN DW (KG): 83.1  Vital Signs: Temp: 98.7 F (37.1 C) (02/25 1553) Temp Source: Axillary (02/25 1553) BP: 145/59 (02/25 0900) Pulse Rate: 82 (02/25 1122)  Labs: Recent Labs    08/25/20 2041 08/26/20 0422 08/26/20 0422 08/26/20 0440 08/27/20 0410 08/28/20 1128  HGB  --  8.8*   < >  --  8.2* 8.7*  HCT  --  28.1*  --   --  26.7* 28.1*  PLT  --  168  --   --  153 180  CREATININE  --  0.96  --   --  1.16* 1.22*  TROPONINIHS 183*  --   --  170*  --   --    < > = values in this interval not displayed.    Estimated Creatinine Clearance: 52.3 mL/min (A) (by C-G formula based on SCr of 1.22 mg/dL (H)).   Medical History: Past Medical History:  Diagnosis Date  . Abnormal pulmonary function test   . Anemia    H/H of 10/30 with a normal MCV in 12/09  . Anxiety   . Arthritis   . Barrett's esophagus    Diagnosed 1995. Last EGD 2016-NO BARRETT'S.   . Chest pain    Negative cardiac catheterization in 2002; negative stress nuclear study in 2008  . Chronic anticoagulation   . Chronic combined systolic and diastolic CHF (congestive heart failure) (HCC)    a. EF predominantly normal during  prior echoes but was 40% during 10/2014 echo. b. Most recent 01/2015 EF normal, 55-60%.  . Chronic LBP    Surgical intervention in 1996  . Diabetes mellitus, type 2 (HCC)    Insulin therapy; exacerbated by prednisone  . Dysrhythmia    AFib  . Gastroparesis    99% retention 05/2008 on GES  . GERD (gastroesophageal reflux disease)   . Hiatal hernia   . Hyperlipidemia   . Hypertension   . Hypothyroid   . IBS (irritable bowel syndrome)   . Obesity   . OSA on CPAP    had CPAP and cannot tolerate.  . Paroxysmal atrial fibrillation (HCC)   . Pulmonary hypertension (HCC) 01/2015   a. Predominantly pulmonary venous hypertension but may be component of PAH.  . Seizures (HCC)    last seizure was 2 years ago; on keppra for this; unknown etiology  . Syncope    a. Admitted 05/2009; magnetic resonance imagin/ MRA - negative; etiology thought to be orthostasis secondary to drugs and dehydration. b. Syncope 02/2015 also felt 2/2 dehydration.    Medications:  Medications Prior to Admission  Medication Sig Dispense Refill Last Dose  . acetaminophen (TYLENOL) 500 MG tablet Take 500 mg by mouth as needed for moderate pain or headache.    PRN  .  amiodarone (PACERONE) 200 MG tablet TAKE 1 TABLET BY MOUTH EVERY MORNING 30 tablet 9 08/23/2020  . apixaban (ELIQUIS) 5 MG TABS tablet Take 1 tablet (5 mg total) by mouth 2 (two) times daily. 60 tablet 11 08/23/2020  . atorvastatin (LIPITOR) 40 MG tablet Take 40 mg by mouth daily.   08/23/2020  . Cholecalciferol (VITAMIN D3) 2000 units capsule Take 2,000 Units by mouth daily.  11 08/23/2020  . Cyanocobalamin 5000 MCG SUBL Take 1 tablet by mouth daily.   08/23/2020  . donepezil (ARICEPT) 5 MG tablet Take 5 mg by mouth daily.   08/23/2020  . DULoxetine (CYMBALTA) 30 MG capsule Take 30 mg by mouth 2 (two) times daily.  0 08/23/2020  . gabapentin (NEURONTIN) 400 MG capsule Take 400-800 mg by mouth 2 (two) times daily. 1 tablet in am and 2 at bedtime  11 08/23/2020  .  glipiZIDE (GLUCOTROL) 10 MG tablet Take 10 mg by mouth daily.  11 08/23/2020  . hydrALAZINE (APRESOLINE) 25 MG tablet Take 25 mg by mouth every 8 (eight) hours.   08/23/2020  . HYDROcodone-acetaminophen (NORCO) 10-325 MG tablet Take 0.5 tablets by mouth at bedtime as needed for moderate pain.   Past Month at Unknown time  . insulin glargine (LANTUS) 100 UNIT/ML injection Inject 0.55 mLs (55 Units total) into the skin at bedtime. (Patient taking differently: Inject 20-30 Units into the skin at bedtime.) 10 mL 0 08/23/2020  . JARDIANCE 10 MG TABS tablet Take 10 mg by mouth daily.   08/23/2020  . Lactase 9000 units CHEW 1 PO Q4H WHEN YOU EAT ICE CREAM OR CHEESE (Patient taking differently: Chew 1 tablet by mouth every 4 (four) hours as needed (when eating dairy products).) 90 tablet 11 Past Week at Unknown time  . levETIRAcetam (KEPPRA) 1000 MG tablet Take 1,500 mg by mouth 2 (two) times daily.   1 08/23/2020  . levothyroxine (SYNTHROID) 175 MCG tablet Take 1 tablet by mouth daily.   08/23/2020  . loperamide (IMODIUM) 2 MG capsule 1 PO Q4H PRN LOOSE STOOLS (Patient taking differently: Take 4 mg by mouth every 4 (four) hours as needed. 1 PO Q4H PRN LOOSE STOOLS) 120 capsule 11 PRN  . LORazepam (ATIVAN) 1 MG tablet Take 0.5 mg by mouth at bedtime as needed for anxiety or sleep. for anxiety  5 Past Week at Unknown time  . magnesium oxide (MAG-OX) 400 MG tablet Take 1 tablet (400 mg total) by mouth 2 (two) times daily. 180 tablet 1 08/23/2020  . metFORMIN (GLUCOPHAGE) 500 MG tablet Take 500 mg by mouth 2 (two) times daily with a meal.   08/23/2020  . Multiple Vitamin (MULTIVITAMIN WITH MINERALS) TABS tablet Take 1 tablet by mouth daily.   08/23/2020  . ondansetron (ZOFRAN) 4 MG tablet TAKE 1 TABLET BY MOUTH FOUR TIMES DAILY AS NEEDED FOR NAUSEA (Patient taking differently: Take 4 mg by mouth 4 (four) times daily as needed for nausea.) 120 tablet 1 Past Month at Unknown time  . OXYGEN 2 L daily.    08/23/2020  .  pantoprazole (PROTONIX) 40 MG tablet TAKE ONE TABLET BY MOUTH TWICE DAILY BEFORE APPLY MEAL (Patient taking differently: Take 40 mg by mouth 2 (two) times daily.) 180 tablet 3 08/23/2020  . potassium chloride (KLOR-CON) 10 MEQ tablet Take 20 mEq by mouth daily.   08/23/2020  . torsemide (DEMADEX) 20 MG tablet TAKE TWO TABLETS BY MOUTH TWICE DAILY (Patient taking differently: Take 100 mg by mouth daily.) 120 tablet 6  08/23/2020  . SM MELATONIN 3 MG TABS tablet Take 3 mg by mouth at bedtime as needed for sleep.       Assessment: Heparin for Afib.  Pt took apixaban dose this am, 5mg  at 0841.  Will use aPTT to manage Heparin until HL and aPTT correlate.   Goal of Therapy:  aPTT 55-102 seconds Monitor platelets by anticoagulation protocol: Yes   Plan:  Begin Heparin infusion at 1200 units/hr, no bolus given apixaban dose today Check aPTT ~ 8 hours after Heparin started Monitor CBC, s/sx bleeding complications  A 08/28/2020,5:03 PM

## 2020-08-29 ENCOUNTER — Inpatient Hospital Stay (HOSPITAL_COMMUNITY): Payer: PPO

## 2020-08-29 DIAGNOSIS — I6389 Other cerebral infarction: Secondary | ICD-10-CM

## 2020-08-29 DIAGNOSIS — I639 Cerebral infarction, unspecified: Secondary | ICD-10-CM | POA: Diagnosis not present

## 2020-08-29 DIAGNOSIS — J181 Lobar pneumonia, unspecified organism: Secondary | ICD-10-CM | POA: Diagnosis not present

## 2020-08-29 DIAGNOSIS — I482 Chronic atrial fibrillation, unspecified: Secondary | ICD-10-CM | POA: Diagnosis not present

## 2020-08-29 DIAGNOSIS — J9601 Acute respiratory failure with hypoxia: Secondary | ICD-10-CM | POA: Diagnosis not present

## 2020-08-29 LAB — CULTURE, BLOOD (ROUTINE X 2): Special Requests: ADEQUATE

## 2020-08-29 LAB — ECHOCARDIOGRAM COMPLETE
AR max vel: 3.04 cm2
AV Area VTI: 3.3 cm2
AV Area mean vel: 2.88 cm2
AV Mean grad: 3.1 mmHg
AV Peak grad: 6.9 mmHg
Ao pk vel: 1.31 m/s
Area-P 1/2: 8.37 cm2
Height: 66 in
S' Lateral: 2.97 cm
Weight: 3770.75 oz

## 2020-08-29 LAB — CBC
HCT: 29.5 % — ABNORMAL LOW (ref 36.0–46.0)
Hemoglobin: 8.9 g/dL — ABNORMAL LOW (ref 12.0–15.0)
MCH: 32.2 pg (ref 26.0–34.0)
MCHC: 30.2 g/dL (ref 30.0–36.0)
MCV: 106.9 fL — ABNORMAL HIGH (ref 80.0–100.0)
Platelets: 194 10*3/uL (ref 150–400)
RBC: 2.76 MIL/uL — ABNORMAL LOW (ref 3.87–5.11)
RDW: 14.6 % (ref 11.5–15.5)
WBC: 18.1 10*3/uL — ABNORMAL HIGH (ref 4.0–10.5)
nRBC: 0.3 % — ABNORMAL HIGH (ref 0.0–0.2)

## 2020-08-29 LAB — APTT
aPTT: 85 seconds — ABNORMAL HIGH (ref 24–36)
aPTT: 146 seconds — ABNORMAL HIGH (ref 24–36)
aPTT: 104 seconds — ABNORMAL HIGH (ref 24–36)

## 2020-08-29 LAB — GLUCOSE, CAPILLARY
Glucose-Capillary: 170 mg/dL — ABNORMAL HIGH (ref 70–99)
Glucose-Capillary: 228 mg/dL — ABNORMAL HIGH (ref 70–99)
Glucose-Capillary: 238 mg/dL — ABNORMAL HIGH (ref 70–99)
Glucose-Capillary: 247 mg/dL — ABNORMAL HIGH (ref 70–99)
Glucose-Capillary: 261 mg/dL — ABNORMAL HIGH (ref 70–99)
Glucose-Capillary: 318 mg/dL — ABNORMAL HIGH (ref 70–99)

## 2020-08-29 LAB — COMPREHENSIVE METABOLIC PANEL
ALT: 19 U/L (ref 0–44)
AST: 15 U/L (ref 15–41)
Albumin: 2.5 g/dL — ABNORMAL LOW (ref 3.5–5.0)
Alkaline Phosphatase: 51 U/L (ref 38–126)
Anion gap: 12 (ref 5–15)
BUN: 43 mg/dL — ABNORMAL HIGH (ref 8–23)
CO2: 25 mmol/L (ref 22–32)
Calcium: 10.1 mg/dL (ref 8.9–10.3)
Chloride: 114 mmol/L — ABNORMAL HIGH (ref 98–111)
Creatinine, Ser: 1.11 mg/dL — ABNORMAL HIGH (ref 0.44–1.00)
GFR, Estimated: 53 mL/min — ABNORMAL LOW (ref >60)
Glucose, Bld: 194 mg/dL — ABNORMAL HIGH (ref 70–99)
Potassium: 3.4 mmol/L — ABNORMAL LOW (ref 3.5–5.1)
Sodium: 151 mmol/L — ABNORMAL HIGH (ref 135–145)
Total Bilirubin: 0.6 mg/dL (ref 0.3–1.2)
Total Protein: 6.3 g/dL — ABNORMAL LOW (ref 6.5–8.1)

## 2020-08-29 LAB — LIPID PANEL
Cholesterol: 149 mg/dL (ref 0–200)
HDL: 38 mg/dL — ABNORMAL LOW (ref >40)
LDL Cholesterol: 88 mg/dL (ref 0–99)
Total CHOL/HDL Ratio: 3.9 RATIO
Triglycerides: 113 mg/dL (ref <150)
VLDL: 23 mg/dL (ref 0–40)

## 2020-08-29 LAB — HEPARIN LEVEL (UNFRACTIONATED): Heparin Unfractionated: 1.68 IU/mL — ABNORMAL HIGH (ref 0.30–0.70)

## 2020-08-29 LAB — MAGNESIUM: Magnesium: 2.5 mg/dL — ABNORMAL HIGH (ref 1.7–2.4)

## 2020-08-29 MED ORDER — POTASSIUM CL IN DEXTROSE 5% 20 MEQ/L IV SOLN
20.0000 meq | INTRAVENOUS | Status: AC
  Administered 2020-08-29: 20 meq via INTRAVENOUS

## 2020-08-29 NOTE — Progress Notes (Signed)
SLP Cancellation Note  Patient Details Name: [name] MRN: [id number] DOB: Oct 04, 1950   Cancelled treatment:       Reason Eval/Treat Not Completed: Patient's level of consciousness;Fatigue/lethargy limiting ability to participate. Despite max cues SLP was unable to rouse Pt to an appropriate level for PO trials. Recommend continue NPO status pending BSE completion; SLP will continue efforts. Thank you,  Sadeen Wiegel H. Romie Levee, CCC-SLP Speech Language Pathologist    Georgetta Haber 08/29/2020, 4:40 PM

## 2020-08-29 NOTE — Progress Notes (Signed)
ANTICOAGULATION CONSULT NOTE  Pharmacy Consult for Heparin Indication: atrial fibrillation   Labs: Recent Labs    08/27/20 0410 08/28/20 1128 08/29/20 0058 08/29/20 0416 08/29/20 0932 08/29/20 0933 08/29/20 2110  HGB 8.2* 8.7*  --  8.9*  --   --   --   HCT 26.7* 28.1*  --  29.5*  --   --   --   PLT 153 180  --  194  --   --   --   APTT  --   --  85*  --   --  146* 104*  HEPARINUNFRC  --   --   --   --  1.68*  --   --   CREATININE 1.16* 1.22*  --  1.11*  --   --   --      Assessment: Heparin for Afib.  Pt took apixaban dose this am, 5mg  at 0841.  Will use aPTT to manage Heparin until HL and aPTT correlate.  Initial aPTT 85 sec.    2/26 PM update:  APTT remains elevated  Goal of Therapy:  Heparin level 0.3-0.7 units/mL APTT 66-102 sec Monitor platelets by anticoagulation protocol: Yes   Plan:  Dec heparin to 850 units/hr Re-check heparin level and aPTT in 8 hours  3/26, PharmD, BCPS Clinical Pharmacist Phone: [phone]

## 2020-08-29 NOTE — Progress Notes (Signed)
ANTICOAGULATION CONSULT NOTE  Pharmacy Consult for Heparin Indication: atrial fibrillation   Labs: Recent Labs    08/26/20 0422 08/26/20 0440 08/27/20 0410 08/28/20 1128 08/29/20 0058  HGB 8.8*  --  8.2* 8.7*  --   HCT 28.1*  --  26.7* 28.1*  --   PLT 168  --  153 180  --   APTT  --   --   --   --  85*  CREATININE 0.96  --  1.16* 1.22*  --   TROPONINIHS  --  170*  --   --   --      Assessment: Heparin for Afib.  Pt took apixaban dose this am, 5mg  at 0841.  Will use aPTT to manage Heparin until HL and aPTT correlate.  Initial aPTT 85 sec.    Goal of Therapy:  APTT 66-102 sec Monitor platelets by anticoagulation protocol: Yes   Plan:  Continue heparin infusion at 1200 units/hr Check aPTT and heparin level in 6-8 hours Monitor CBC, s/sx bleeding complications  Seay, Lora Poteet 08/29/2020,2:39 AM

## 2020-08-29 NOTE — Progress Notes (Signed)
ANTICOAGULATION CONSULT NOTE  Pharmacy Consult for Heparin Indication: atrial fibrillation  Labs: Recent Labs    08/27/20 0410 08/28/20 1128 08/29/20 0058 08/29/20 0416 08/29/20 0933  HGB 8.2* 8.7*  --  8.9*  --   HCT 26.7* 28.1*  --  29.5*  --   PLT 153 180  --  194  --   APTT  --   --  85*  --  146*  CREATININE 1.16* 1.22*  --  1.11*  --    Assessment: Heparin for Afib.  Pt took apixaban dose 0n 2/25.  Will use aPTT to manage Heparin until HL and aPTT correlate.  Initial aPTT 85 sec. >> has trended up to 146, now supratherapeutic  Goal of Therapy:  APTT 66-102 sec Monitor platelets by anticoagulation protocol: Yes   Plan:  Decrease heparin infusion to 1000 units/hr Check aPTT in 6-8 hours, f/u heparin level for correlation when reported Monitor CBC, s/sx bleeding complications  Valrie Hart A 08/29/2020,12:21 PM

## 2020-08-29 NOTE — Progress Notes (Addendum)
PROGRESS NOTE  [name] VHQ:[id number] DOB: Sep 04, 1950 DOA: 08/23/2020 PCP: Gareth Morgan, MD  Brief History:  70 year old female admitted to the hospital with acute respiratory failure secondary to possible pneumonia. Patient was intubated in the emergency room. She was noted to be in septic shock and was started on vasopressors and antibiotics. History is further complicated by recent diagnosis of cardiomyopathy and ejection fraction 15 to 20%. She also has atrial fibrillation on amiodarone and Eliquis. PCCM following. Patient recently hospitalized at [facility] with ACS/NSTEMI from 08/17/20 to 08/21/20. No evidence of ischemic cardiomyopathy based on left heart catheterization 2/15. Patient was switched to PO diuretics (torsemide 100 mg bid) on 2/17, after improvement in volume status. Beta-blockers were not introduced on discharge since patient has had moments of hypotension. She was d/ced on hydralazine 25 mg tid, Jardiance 10 mg daily, and torsemide 100 mg daily. She was continued on amio 200 mg daily. She was d/ced back on her usual 2L [place]. It was felt her CM was due to uncontrolled HTN and afib with RVR.   Assessment/Plan: Acuteon chronicrespiratory failure with hypoxia and hypercapnia -Secondary to pneumoniain the setting of underlying OSA -Patient was intubated on 2/20 and is on mechanical ventilation -Appreciate PCCM assistance -Other etiologies to be considered as amiodarone toxicity -started IV solumedrol 2/23 -weaning off diprivan in anticipation for weaning vent -2/25--personally reviewed CXR--LLL opacity, increased interstitial markings at bases -08/28/20--extubated>>3LNC -minimize opioids, hypnotic meds  Septic shock -Secondary to pneumonia -MRSA PCR is negative, vancomycin discontinued -Unfortunately, she is continued to have fevers -Blood cultures have been sent -D/Ccefepime -started zosyn 2/24 -Weanedoff Levophed 2/23  Acute Ischemic  Stroke -08/28/20 MR brain--acute/subacute 69mm lacunar infarct at R-centrum semiovale -case discussed with neurology, Dr. Wilford Corner -ok to continue IV heparin, hold antiplatelet agents -due to small vessel disease -08/24/20 A1C--7.9 -check lipid panel -speech eval -PT eval -Echo -carotid US  Bacteremia -contaminant -Staph epi in one of two sets -d/c vanc  chronicsystolic congestive heart failure -Recent echocardiogram at [facility] showed ejection fraction of 15 to 20% -No evidence of ischemic cardiomyopathy based on heart catheterization on 2/15 at [facility] -She was discharged from [facility] on torsemide 100 mg twice daily -Currently, CVP is7-9and blood pressures of also been low, will hold off on resuming diuretics at this time. -remains clinically euvolemic  Bradycardia/Afib with Slow ventricular response -Patient noted to have episodes of bradycardia with pauses -EKG shows prolonged QT interval, and diffuse T wave inversions -hold off on further amiodarone -08/17/20 Echo--EF 15-20%, mod TR, severe elevated PASP -Replace electrolytes -bradycardia partly due to precedex, diprivan -If heart rate sustains bradycardic, may need to consider continuing on low-dose dobutamine/Levophed. 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 -HR improved off diprivan and precedex  chronic kidney disease stage IIIa -Baseline creatinine approximately1.1-1.4 -Continue to monitor  Chronic atrial fibrillation -Anticoagulated with Eliquis -She was on amiodarone which has been d/ced -She is having episodes of bradycardia, will hold off on further amio for now; also concerned about amio pulm toxicity -HR controlled with IV lopressor  DiabetesMellitus type 2 -Blood sugars elevated -08/17/20 A1C--7.2 -Continue Lantus to 20 units -Continue on sliding scale--increase to moderate scale -holding metformin and glipizide  FEN--Hypernatremia -hypokalemia-->repleted -check Mag-2.3 -remains  hypernatremic -start D5W  Hypothyroidism -Continue on Synthroid>>IV -TSH 0.638  Anemia of chronic disease -Baseline Hgb ~10 -Transfuse for hemoglobin less than 7 or if she has significant bleeding -Hgb now stable ~8, likely dilution  History of  seizures -Continue on Keppra  Dementiawithout behavioral disturbance -Continue Aricept       Status is: Inpatient  Remains inpatient appropriate due to IV therapy and hemodynamic instability  Dispo: The patient is from:Home Anticipated d/c is ZO:XWRU Anticipated d/c date is: 3 days Patient currently is not medically stable to d/c. Difficult to place patient No        Family Communication:Daughter updatedat bedside 2/26  Consultants:PCCM  Code Status: FULL  DVT Prophylaxis:IV heparin  Procedures: As Listed in Progress Note Above  Antibiotics: Cefepime 2/21>>2/24 Zosyn 2/24>>>      Subjective: Patient awakens with verbal stimuli. Only intermittently answers questions.  Denies pain or sob.  Remainder ROS unobtainable due to encephalopathy  Objective: Vitals:   08/29/20 0530 08/29/20 0758 08/29/20 0800 08/29/20 1000  BP: (!) 156/83  (!) 168/88 133/68  Pulse: 82 (!) 105 100 78  Resp: 17 (!) 22 (!) 21 (!) 32  Temp:  98.5 F (36.9 C)    TempSrc:  Axillary    SpO2: 100% 96% 96% 92%  Weight:      Height:        Intake/Output Summary (Last 24 hours) at 08/29/2020 1050 Last data filed at 08/29/2020 1024 Gross per 24 hour  Intake 1467.55 ml  Output 1500 ml  Net -32.45 ml   Weight change:  Exam:   General:  Pt is alert, follows commands appropriately, not in acute distress  HEENT: No icterus, No thrush, No neck mass, [place]/AT  Cardiovascular: RRR, S1/S2, no rubs, no gallops  Respiratory: bibasilar rales. No wheeze  Abdomen: Soft/+BS, non tender, non distended, no guarding  Extremities: Nonpitting edema, No  lymphangitis, No petechiae, No rashes, no synovitis   Data Reviewed: I have personally reviewed following labs and imaging studies Basic Metabolic Panel: Recent Labs  Lab 08/25/20 0425 08/25/20 1656 08/26/20 0422 08/27/20 0410 08/28/20 1128 08/29/20 0416  NA 142  --  147* 144 145 151*  K 3.4* 3.3* 3.2* 3.8 3.5 3.4*  CL 100  --  109 109 110 114*  CO2 30  --  27 24 23 25   GLUCOSE 320*  --  170* 352* 284* 194*  BUN 37*  --  28* 38* 50* 43*  CREATININE 1.56*  --  0.96 1.16* 1.22* 1.11*  CALCIUM 8.6*  --  8.7* 8.6* 9.0 10.1  MG 2.4  --   --  2.3  --  2.5*  PHOS 2.8  --   --   --   --   --    Liver Function Tests: Recent Labs  Lab 08/23/20 2305 08/26/20 0422 08/27/20 0410 08/28/20 1128 08/29/20 0416  AST 31 19 14* 14* 15  ALT 28 20 17 18 19   ALKPHOS 71 56 53 49 51  BILITOT 0.5 0.8 0.8 0.3 0.6  PROT 6.6 5.8* 5.8* 6.2* 6.3*  ALBUMIN 3.4* 2.4* 2.2* 2.4* 2.5*   No results for input(s): LIPASE, AMYLASE in the last 168 hours. No results for input(s): AMMONIA in the last 168 hours. Coagulation Profile: Recent Labs  Lab 08/23/20 2305  INR 1.8*   CBC: Recent Labs  Lab 08/25/20 0726 08/26/20 0422 08/27/20 0410 08/28/20 1128 08/29/20 0416  WBC 13.0* 11.2* 7.4 14.1* 18.1*  HGB 9.2* 8.8* 8.2* 8.7* 8.9*  HCT 29.2* 28.1* 26.7* 28.1* 29.5*  MCV 102.8* 103.7* 106.8* 106.4* 106.9*  PLT 191 168 153 180 194   Cardiac Enzymes: Recent Labs  Lab 08/23/20 2305  CKTOTAL 49   BNP: Invalid input(s): POCBNP CBG:  Recent Labs  Lab 08/28/20 1418 08/28/20 2026 08/29/20 0013 08/29/20 0403 08/29/20 0757  GLUCAP 232* 245* 228* 170* 247*   HbA1C: No results for input(s): HGBA1C in the last 72 hours. Urine analysis:    Component Value Date/Time   COLORURINE AMBER (A) 08/23/2020 2346   APPEARANCEUR HAZY (A) 08/23/2020 2346   LABSPEC 1.011 08/23/2020 2346   PHURINE 5.0 08/23/2020 2346   GLUCOSEU >=500 (A) 08/23/2020 2346   HGBUR NEGATIVE 08/23/2020 2346   BILIRUBINUR  NEGATIVE 08/23/2020 2346   KETONESUR NEGATIVE 08/23/2020 2346   PROTEINUR NEGATIVE 08/23/2020 2346   UROBILINOGEN 0.2 02/05/2015 1700   NITRITE NEGATIVE 08/23/2020 2346   LEUKOCYTESUR NEGATIVE 08/23/2020 2346   Sepsis Labs: @LABRCNTIP (procalcitonin:4,lacticidven:4) ) Recent Results (from the past 240 hour(s))  Resp Panel by RT-PCR (Flu A&B, Covid) Nasopharyngeal Swab     Status: None   Collection Time: 08/23/20 11:45 PM   Specimen: Nasopharyngeal Swab; Nasopharyngeal(NP) swabs in vial transport medium  Result Value Ref Range Status   SARS Coronavirus 2 by RT PCR NEGATIVE NEGATIVE Final    Comment: (NOTE) SARS-CoV-2 target nucleic acids are NOT DETECTED.  The SARS-CoV-2 RNA is generally detectable in upper respiratory specimens during the acute phase of infection. The lowest concentration of SARS-CoV-2 viral copies this assay can detect is 138 copies/mL. A negative result does not preclude SARS-Cov-2 infection and should not be used as the sole basis for treatment or other patient management decisions. A negative result may occur with  improper specimen collection/handling, submission of specimen other than nasopharyngeal swab, presence of viral mutation(s) within the areas targeted by this assay, and inadequate number of viral copies(<138 copies/mL). A negative result must be combined with clinical observations, patient history, and epidemiological information. The expected result is Negative.  Fact Sheet for Patients:  BloggerCourse.com  Fact Sheet for Healthcare Providers:  SeriousBroker.it  This test is no t yet approved or cleared by the [place] FDA and  has been authorized for detection and/or diagnosis of SARS-CoV-2 by FDA under an Emergency Use Authorization (EUA). This EUA will remain  in effect (meaning this test can be used) for the duration of the COVID-19 declaration under Section 564(b)(1) of the Act,  21 U.S.C.section 360bbb-3(b)(1), unless the authorization is terminated  or revoked sooner.       Influenza A by PCR NEGATIVE NEGATIVE Final   Influenza B by PCR NEGATIVE NEGATIVE Final    Comment: (NOTE) The Xpert Xpress SARS-CoV-2/FLU/RSV plus assay is intended as an aid in the diagnosis of influenza from Nasopharyngeal swab specimens and should not be used as a sole basis for treatment. Nasal washings and aspirates are unacceptable for Xpert Xpress SARS-CoV-2/FLU/RSV testing.  Fact Sheet for Patients: BloggerCourse.com  Fact Sheet for Healthcare Providers: SeriousBroker.it  This test is not yet approved or cleared by the [place] FDA and has been authorized for detection and/or diagnosis of SARS-CoV-2 by FDA under an Emergency Use Authorization (EUA). This EUA will remain in effect (meaning this test can be used) for the duration of the COVID-19 declaration under Section 564(b)(1) of the Act, 21 U.S.C. section 360bbb-3(b)(1), unless the authorization is terminated or revoked.  Performed at [facility], [address] [place], [place] [address]   MRSA PCR Screening     Status: None   Collection Time: 08/24/20  9:57 AM   Specimen: Nasal Mucosa; Nasopharyngeal  Result Value Ref Range Status   MRSA by PCR NEGATIVE NEGATIVE Final    Comment:  The GeneXpert MRSA Assay (FDA approved for NASAL specimens only), is one component of a comprehensive MRSA colonization surveillance program. It is not intended to diagnose MRSA infection nor to guide or monitor treatment for MRSA infections. Performed at [facility], [address] [place], [place] [address]   Culture, blood (routine x 2)     Status: Abnormal   Collection Time: 08/25/20  1:55 PM   Specimen: BLOOD RIGHT HAND  Result Value Ref Range Status   Specimen Description   Final    BLOOD RIGHT HAND Performed at [facility], [address] [place],  [place] [address]    Special Requests   Final    BOTTLES DRAWN AEROBIC AND ANAEROBIC Blood Culture adequate volume Performed at [facility], [address] [place], [place] [address]    Culture  Setup Time   Final    GRAM POSITIVE COCCI AEROBIC BOTTLE Gram Stain Report Called to,Read Back By and Verified With: LOOMIS,K. @1806  08/27/20 BILLINGSLEY,L CRITICAL RESULT CALLED TO, READ BACK BY AND VERIFIED WITH: H TETREAULT RN 08/28/20 0355 JDW    Culture (A)  Final    STAPHYLOCOCCUS EPIDERMIDIS THE SIGNIFICANCE OF ISOLATING THIS ORGANISM FROM A SINGLE SET OF BLOOD CULTURES WHEN MULTIPLE SETS ARE DRAWN IS UNCERTAIN. PLEASE NOTIFY THE MICROBIOLOGY DEPARTMENT WITHIN ONE WEEK IF SPECIATION AND SENSITIVITIES ARE REQUIRED. Performed at [facility], [address] [place], [place] [address]    Report Status 08/29/2020 FINAL  Final  Culture, blood (routine x 2)     Status: None (Preliminary result)   Collection Time: 08/25/20  1:55 PM   Specimen: BLOOD  Result Value Ref Range Status   Specimen Description BLOOD RIGHT ANTECUBITAL  Final   Special Requests   Final    BOTTLES DRAWN AEROBIC AND ANAEROBIC Blood Culture adequate volume   Culture   Final    NO GROWTH 4 DAYS Performed at [facility], [address] [place], [place] [address]    Report Status PENDING  Incomplete  Blood Culture ID Panel (Reflexed)     Status: Abnormal   Collection Time: 08/25/20  1:55 PM  Result Value Ref Range Status   Enterococcus faecalis NOT DETECTED NOT DETECTED Final   Enterococcus Faecium NOT DETECTED NOT DETECTED Final   Listeria monocytogenes NOT DETECTED NOT DETECTED Final   Staphylococcus species DETECTED (A) NOT DETECTED Final    Comment: CRITICAL RESULT CALLED TO, READ BACK BY AND VERIFIED WITH: H TETREAULT RN 08/28/20 0355 JDW    Staphylococcus aureus (BCID) NOT DETECTED NOT DETECTED Final   Staphylococcus epidermidis DETECTED (A) NOT DETECTED Final    Comment: Methicillin (oxacillin) resistant  coagulase negative staphylococcus. Possible blood culture contaminant (unless isolated from more than one blood culture draw or clinical case suggests pathogenicity). No antibiotic treatment is indicated for blood  culture contaminants. CRITICAL RESULT CALLED TO, READ BACK BY AND VERIFIED WITH: H TETREAULT RN 08/28/20 0355 JDW    Staphylococcus lugdunensis NOT DETECTED NOT DETECTED Final   Streptococcus species NOT DETECTED NOT DETECTED Final   Streptococcus agalactiae NOT DETECTED NOT DETECTED Final   Streptococcus pneumoniae NOT DETECTED NOT DETECTED Final   Streptococcus pyogenes NOT DETECTED NOT DETECTED Final   A.calcoaceticus-baumannii NOT DETECTED NOT DETECTED Final   Bacteroides fragilis NOT DETECTED NOT DETECTED Final   Enterobacterales NOT DETECTED NOT DETECTED Final   Enterobacter cloacae complex NOT DETECTED NOT DETECTED Final   Escherichia coli NOT DETECTED NOT DETECTED Final   Klebsiella aerogenes NOT DETECTED NOT DETECTED  Final   Klebsiella oxytoca NOT DETECTED NOT DETECTED Final   Klebsiella pneumoniae NOT DETECTED NOT DETECTED Final   Proteus species NOT DETECTED NOT DETECTED Final   Salmonella species NOT DETECTED NOT DETECTED Final   Serratia marcescens NOT DETECTED NOT DETECTED Final   Haemophilus influenzae NOT DETECTED NOT DETECTED Final   Neisseria meningitidis NOT DETECTED NOT DETECTED Final   Pseudomonas aeruginosa NOT DETECTED NOT DETECTED Final   Stenotrophomonas maltophilia NOT DETECTED NOT DETECTED Final   Candida albicans NOT DETECTED NOT DETECTED Final   Candida auris NOT DETECTED NOT DETECTED Final   Candida glabrata NOT DETECTED NOT DETECTED Final   Candida krusei NOT DETECTED NOT DETECTED Final   Candida parapsilosis NOT DETECTED NOT DETECTED Final   Candida tropicalis NOT DETECTED NOT DETECTED Final   Cryptococcus neoformans/gattii NOT DETECTED NOT DETECTED Final   Methicillin resistance mecA/C DETECTED (A) NOT DETECTED Final    Comment: CRITICAL  RESULT CALLED TO, READ BACK BY AND VERIFIED WITH: H TETREAULT RN 08/28/20 0355 JDW Performed at [facility], [address] [place], [place] [address]   Culture, Respiratory w Gram Stain     Status: None   Collection Time: 08/25/20  2:36 PM   Specimen: Tracheal Aspirate; Respiratory  Result Value Ref Range Status   Specimen Description   Final    TRACHEAL ASPIRATE Performed at [facility], [address] [place], [place] [address]    Special Requests   Final    NONE Performed at [facility], [address] [place], [place] [address]    Gram Stain   Final    NO WBC SEEN FEW GRAM POSITIVE COCCI Performed at [facility], [address] [place], [place] [address]    Culture ABUNDANT ENTEROCOCCUS FAECALIS  Final   Report Status 08/27/2020 FINAL  Final   Organism ID, Bacteria ENTEROCOCCUS FAECALIS  Final      Susceptibility   Enterococcus faecalis - MIC*    AMPICILLIN <=2 SENSITIVE Sensitive     VANCOMYCIN 1 SENSITIVE Sensitive     GENTAMICIN SYNERGY SENSITIVE Sensitive     * ABUNDANT ENTEROCOCCUS FAECALIS     Scheduled Meds: . chlorhexidine  15 mL Mouth Rinse BID  . Chlorhexidine Gluconate Cloth  6 each Topical Daily  . donepezil  5 mg Per Tube QHS  . feeding supplement (PROSource TF)  45 mL Per Tube TID  . feeding supplement (VITAL HIGH PROTEIN)  1,000 mL Per Tube Q24H  . insulin aspart  0-15 Units Subcutaneous Q4H  . insulin glargine  20 Units Subcutaneous Daily  . levothyroxine  87.5 mcg Intravenous Daily  . mouth rinse  15 mL Mouth Rinse q12n4p  . methylPREDNISolone (SOLU-MEDROL) injection  40 mg Intravenous Q12H  . metoprolol tartrate  2.5 mg Intravenous Q6H   Continuous Infusions: . sodium chloride 50 mL/hr at 08/29/20 1024  . heparin 1,200 Units/hr (08/29/20 1024)  . levETIRAcetam Stopped (08/29/20 1022)  . piperacillin-tazobactam (ZOSYN)  IV Stopped (08/29/20 0912)  . sodium chloride Stopped (08/24/20 0107)    Procedures/Studies: CT ABDOMEN  PELVIS WO CONTRAST  Result Date: 08/24/2020 CLINICAL DATA:  Status post fall, unresponsive. EXAM: CT CHEST, ABDOMEN AND PELVIS WITHOUT CONTRAST TECHNIQUE: Multidetector CT imaging of the chest, abdomen and pelvis was performed following the standard protocol without IV contrast. COMPARISON:  None. FINDINGS: CT CHEST FINDINGS Cardiovascular: A right internal jugular venous catheter is in place. There is moderate severity calcification of the aortic arch. There is moderate severity  cardiomegaly. No pericardial effusion. Mediastinum/Nodes: No enlarged mediastinal, hilar, or axillary lymph nodes. Thyroid gland, trachea, and esophagus demonstrate no significant findings. Lungs/Pleura: Endotracheal and nasogastric tubes are in place. Moderate severity left upper lobe and bilateral lower lobe atelectasis and/or infiltrate is noted. Small bilateral pleural effusions are noted, right greater than left. No pneumothorax is identified. Musculoskeletal: There is evidence of prior vertebroplasty at the level of T12. A chronic fracture deformity of the sternum is seen. Multilevel degenerative changes are noted throughout the thoracic spine. CT ABDOMEN PELVIS FINDINGS Hepatobiliary: No focal liver abnormality is seen. Status post cholecystectomy. No biliary dilatation. Pancreas: Unremarkable. No pancreatic ductal dilatation or surrounding inflammatory changes. Spleen: Normal in size without focal abnormality. Adrenals/Urinary Tract: Adrenal glands are unremarkable. Kidneys are normal, without renal calculi, focal lesion, or hydronephrosis. A Foley catheter is seen within the urinary bladder. Stomach/Bowel: A nasogastric tube is seen with its distal tip noted within the proximal duodenum. The stomach is otherwise within normal limits. The appendix is not clearly visualized. No evidence of bowel wall thickening, distention, or inflammatory changes. Noninflamed diverticula are seen throughout the sigmoid colon. Vascular/Lymphatic:  Aortic atherosclerosis. No enlarged abdominal or pelvic lymph nodes. Reproductive: Status post hysterectomy. No adnexal masses. Other: There is a 2.5 cm x 4.5 cm fat containing umbilical hernia. No abdominopelvic ascites. Musculoskeletal: Multilevel degenerative changes seen throughout the lumbar spine. IMPRESSION: 1. Moderate severity left upper lobe and bilateral lower lobe atelectasis and/or infiltrate. 2. Small bilateral pleural effusions, right greater than left. 3. Moderate severity cardiomegaly. 4. Evidence of prior vertebroplasty at the level of T12. 5. Sigmoid diverticulosis. 6. Fat-containing umbilical hernia. 7. Aortic atherosclerosis. Aortic Atherosclerosis (ICD10-I70.0). Electronically Signed   By: Aram Candela M.D.   On: 08/24/2020 00:39   CT HEAD WO CONTRAST  Result Date: 08/24/2020 CLINICAL DATA:  Status post fall, unresponsive. EXAM: CT HEAD WITHOUT CONTRAST TECHNIQUE: Contiguous axial images were obtained from the base of the skull through the vertex without intravenous contrast. COMPARISON:  August 15, 2020 FINDINGS: Brain: There is mild cerebral atrophy with widening of the extra-axial spaces and ventricular dilatation. There are areas of decreased attenuation within the white matter tracts of the supratentorial brain, consistent with microvascular disease changes. Vascular: No hyperdense vessel or unexpected calcification. Skull: Normal. Negative for fracture or focal lesion. Sinuses/Orbits: No acute finding. Other: Endotracheal and orogastric tubes are in place. IMPRESSION: No acute intracranial pathology. Electronically Signed   By: Aram Candela M.D.   On: 08/24/2020 00:30   CT Chest Wo Contrast  Result Date: 08/24/2020 CLINICAL DATA:  Status post fall, unresponsive. EXAM: CT CHEST, ABDOMEN AND PELVIS WITHOUT CONTRAST TECHNIQUE: Multidetector CT imaging of the chest, abdomen and pelvis was performed following the standard protocol without IV contrast. COMPARISON:  None.  FINDINGS: CT CHEST FINDINGS Cardiovascular: A right internal jugular venous catheter is in place. There is moderate severity calcification of the aortic arch. There is moderate severity cardiomegaly. No pericardial effusion. Mediastinum/Nodes: No enlarged mediastinal, hilar, or axillary lymph nodes. Thyroid gland, trachea, and esophagus demonstrate no significant findings. Lungs/Pleura: Endotracheal and nasogastric tubes are in place. Moderate severity left upper lobe and bilateral lower lobe atelectasis and/or infiltrate is noted. Small bilateral pleural effusions are noted, right greater than left. No pneumothorax is identified. Musculoskeletal: There is evidence of prior vertebroplasty at the level of T12. A chronic fracture deformity of the sternum is seen. Multilevel degenerative changes are noted throughout the thoracic spine. CT ABDOMEN PELVIS FINDINGS Hepatobiliary: No focal liver abnormality is  seen. Status post cholecystectomy. No biliary dilatation. Pancreas: Unremarkable. No pancreatic ductal dilatation or surrounding inflammatory changes. Spleen: Normal in size without focal abnormality. Adrenals/Urinary Tract: Adrenal glands are unremarkable. Kidneys are normal, without renal calculi, focal lesion, or hydronephrosis. A Foley catheter is seen within the urinary bladder. Stomach/Bowel: A nasogastric tube is seen with its distal tip noted within the proximal duodenum. The stomach is otherwise within normal limits. The appendix is not clearly visualized. No evidence of bowel wall thickening, distention, or inflammatory changes. Noninflamed diverticula are seen throughout the sigmoid colon. Vascular/Lymphatic: Aortic atherosclerosis. No enlarged abdominal or pelvic lymph nodes. Reproductive: Status post hysterectomy. No adnexal masses. Other: There is a 2.5 cm x 4.5 cm fat containing umbilical hernia. No abdominopelvic ascites. Musculoskeletal: Multilevel degenerative changes seen throughout the lumbar  spine. IMPRESSION: 1. Moderate severity left upper lobe and bilateral lower lobe atelectasis and/or infiltrate. 2. Small bilateral pleural effusions, right greater than left. 3. Moderate severity cardiomegaly. 4. Evidence of prior vertebroplasty at the level of T12. 5. Sigmoid diverticulosis. 6. Fat-containing umbilical hernia. 7. Aortic atherosclerosis. Aortic Atherosclerosis (ICD10-I70.0). Electronically Signed   By: Aram Candela M.D.   On: 08/24/2020 00:40   CT CERVICAL SPINE WO CONTRAST  Result Date: 08/24/2020 CLINICAL DATA:  Status post fall, unresponsive. EXAM: CT CERVICAL SPINE WITHOUT CONTRAST TECHNIQUE: Multidetector CT imaging of the cervical spine was performed without intravenous contrast. Multiplanar CT image reconstructions were also generated. COMPARISON:  October 04, 2012 FINDINGS: Alignment: Normal. Skull base and vertebrae: No acute fracture. No primary bone lesion or focal pathologic process. Soft tissues and spinal canal: No prevertebral fluid or swelling. No visible canal hematoma. Disc levels: Marked severity endplate sclerosis and osteophyte formation is seen at the levels of C3-C4, C4-C5, C5-C6 and C6-C7. There is marked severity narrowing of the anterior atlantoaxial articulation. Mild to moderate severity intervertebral disc space narrowing is seen at the levels of C5-C6 and C6-C7. Moderate severity bilateral multilevel facet joint hypertrophy is noted. Upper chest: Negative. Other: Endotracheal and orogastric tubes are noted. IMPRESSION: 1. No acute osseous abnormality. 2. Marked severity multilevel degenerative changes, most prominent at the levels of C3-C4, C4-C5, C5-C6 and C6-C7. Electronically Signed   By: Aram Candela M.D.   On: 08/24/2020 00:32   MR BRAIN WO CONTRAST  Result Date: 08/28/2020 CLINICAL DATA:  Mental status change, unknown cause EXAM: MRI HEAD WITHOUT CONTRAST TECHNIQUE: Multiplanar, multiecho pulse sequences of the brain and surrounding structures were  obtained without intravenous contrast. COMPARISON:  08/24/2020 and prior. FINDINGS: Please note some image sequences are degraded by motion artifact. Brain: 2 mm right centrum semiovale acute/subacute insult. No intracranial hemorrhage. No midline shift, ventriculomegaly or extra-axial fluid collection. No mass lesion. Mild cerebral atrophy with ex vacuo dilatation. Mild chronic microvascular ischemic changes. Vascular: Normal flow voids. Skull and upper cervical spine: Normal marrow signal. Sinuses/Orbits: Normal orbits. Small sphenoid sinus mucous retention cyst. Bilateral mastoid effusions. Other: None. IMPRESSION: Acute/subacute 2 mm right centrum semiovale lacunar insult. Mild cerebral atrophy and chronic microvascular ischemic changes. These results will be called to the ordering clinician or representative by the Radiologist Assistant, and communication documented in the PACS or [employer]. Electronically Signed   By: Stana Bunting M.D.   On: 08/28/2020 18:52   DG Chest Port 1 View  Result Date: 08/28/2020 CLINICAL DATA:  Hypoxia EXAM: PORTABLE CHEST 1 VIEW COMPARISON:  August 26, 2020 chest radiograph and August 23, 2020 chest CT FINDINGS: Endotracheal tube tip is 3.8 cm above the carina.  Nasogastric tube tip and side port are below the diaphragm. Central catheter tip is at the cavoatrial junction. No pneumothorax. There is airspace consolidation in the left base with small left pleural effusion. There is mild interstitial edema in the lower lung regions. There is cardiomegaly with pulmonary vascularity within normal limits. There is aortic atherosclerosis. No adenopathy. No bone lesions IMPRESSION: Tube and catheter positions as described without pneumothorax. Persistent left lower lobe airspace consolidation, concerning combination of atelectasis and pneumonia with small left pleural effusion. Mild bibasilar interstitial edema. Stable cardiomegaly. Aortic Atherosclerosis (ICD10-I70.0).  Electronically Signed   By: Bretta Bang III M.D.   On: 08/28/2020 07:47   DG CHEST PORT 1 VIEW  Result Date: 08/26/2020 CLINICAL DATA:  Intubation. EXAM: PORTABLE CHEST 1 VIEW COMPARISON:  08/25/2020. FINDINGS: Endotracheal tube, NG tube, right IJ line in stable position. Stable cardiomegaly. Low lung volumes persistent left base atelectasis/infiltrate. Stable mild bilateral interstitial prominence. Small left pleural effusion again noted. No pneumothorax. IMPRESSION: 1. Lines and tubes in stable position. 2. Low lung volumes. Persistent left base atelectasis/infiltrate and small left pleural effusion. Stable mild bilateral interstitial prominence. 3. Stable cardiomegaly. Electronically Signed   By: Maisie Fus  Register   On: 08/26/2020 05:40   DG Chest Port 1 View  Result Date: 08/25/2020 CLINICAL DATA:  Respiratory failure. EXAM: PORTABLE CHEST 1 VIEW COMPARISON:  Chest x-ray 08/23/2020. FINDINGS: Endotracheal tube, NG tube in stable position. Right IJ line in unchanged position with tip in the right atrium. Cardiomegaly with bilateral interstitial prominence suggesting interstitial edema. Left base atelectasis/infiltrate. No prominent pleural effusion. No pneumothorax. IMPRESSION: 1. Endotracheal tube, NG tube in stable position. Right IJ line in unchanged position with tip in the right atrium 2. Cardiomegaly with bilateral interstitial prominence suggesting interstitial edema. 3.  Left base atelectasis/infiltrate. Electronically Signed   By: Maisie Fus  Register   On: 08/25/2020 05:27   DG Chest Portable 1 View  Result Date: 08/23/2020 CLINICAL DATA:  Status post intubation. EXAM: PORTABLE CHEST 1 VIEW COMPARISON:  August 17, 2020 FINDINGS: An endotracheal tube is seen with its distal tip approximately 2.1 cm from the carina. A nasogastric tube is noted with its distal end seen within the expected region of the gastric antrum. There is a right internal jugular venous catheter. Its distal tip is seen  within the right atrium, approximately 5.2 cm distal to the junction of the superior vena cava and right atrium. Mild atelectasis and/or infiltrate is noted within the right lung base. There is no evidence of a pleural effusion or pneumothorax. The cardiac silhouette is markedly enlarged. The visualized skeletal structures are unremarkable. IMPRESSION: 1. Interval endotracheal tube, nasogastric tube and right internal jugular venous catheter placement and positioning, as described above. 2. Mild right basilar atelectasis and/or infiltrate. Electronically Signed   By: Aram Candela M.D.   On: 08/23/2020 23:56    Catarina Hartshorn, DO  [facility]  If 7PM-7AM, please contact night-coverage [email or web address] Password [email or web address] 08/29/2020, 10:50 AM   LOS: 5 days

## 2020-08-29 NOTE — Progress Notes (Signed)
  Echocardiogram 2D Echocardiogram has been performed.  Leta Jungling M 08/29/2020, 11:15 AM

## 2020-08-30 ENCOUNTER — Inpatient Hospital Stay (HOSPITAL_COMMUNITY): Payer: PPO

## 2020-08-30 DIAGNOSIS — J181 Lobar pneumonia, unspecified organism: Secondary | ICD-10-CM | POA: Diagnosis not present

## 2020-08-30 DIAGNOSIS — J9601 Acute respiratory failure with hypoxia: Secondary | ICD-10-CM | POA: Diagnosis not present

## 2020-08-30 DIAGNOSIS — G9341 Metabolic encephalopathy: Secondary | ICD-10-CM

## 2020-08-30 DIAGNOSIS — I482 Chronic atrial fibrillation, unspecified: Secondary | ICD-10-CM | POA: Diagnosis not present

## 2020-08-30 DIAGNOSIS — I639 Cerebral infarction, unspecified: Secondary | ICD-10-CM | POA: Diagnosis not present

## 2020-08-30 LAB — CBC
HCT: 31.7 % — ABNORMAL LOW (ref 36.0–46.0)
Hemoglobin: 9.6 g/dL — ABNORMAL LOW (ref 12.0–15.0)
MCH: 33 pg (ref 26.0–34.0)
MCHC: 30.3 g/dL (ref 30.0–36.0)
MCV: 108.9 fL — ABNORMAL HIGH (ref 80.0–100.0)
Platelets: 201 10*3/uL (ref 150–400)
RBC: 2.91 MIL/uL — ABNORMAL LOW (ref 3.87–5.11)
RDW: 14.9 % (ref 11.5–15.5)
WBC: 19.2 10*3/uL — ABNORMAL HIGH (ref 4.0–10.5)
nRBC: 1.7 % — ABNORMAL HIGH (ref 0.0–0.2)

## 2020-08-30 LAB — URINALYSIS, COMPLETE (UACMP) WITH MICROSCOPIC
Bilirubin Urine: NEGATIVE
Glucose, UA: >=500 mg/dL — AB
Ketones, ur: NEGATIVE mg/dL
Nitrite: NEGATIVE
Protein, ur: 100 mg/dL — AB
Specific Gravity, Urine: 1.022 (ref 1.005–1.030)
pH: 5 (ref 5.0–8.0)

## 2020-08-30 LAB — BLOOD GAS, ARTERIAL
Acid-Base Excess: 0.9 mmol/L (ref 0.0–2.0)
Acid-Base Excess: 1.5 mmol/L (ref 0.0–2.0)
Bicarbonate: 24.7 mmol/L (ref 20.0–28.0)
Bicarbonate: 25.7 mmol/L (ref 20.0–28.0)
FIO2: 32
FIO2: 32
O2 Saturation: 93.5 %
O2 Saturation: 93.9 %
Patient temperature: 37
Patient temperature: 37.3
pCO2 arterial: 39.1 mmHg (ref 32.0–48.0)
pCO2 arterial: 48.7 mmHg — ABNORMAL HIGH (ref 32.0–48.0)
pH, Arterial: 7.346 — ABNORMAL LOW (ref 7.350–7.450)
pH, Arterial: 7.428 (ref 7.350–7.450)
pO2, Arterial: 70.6 mmHg — ABNORMAL LOW (ref 83.0–108.0)
pO2, Arterial: 82 mmHg — ABNORMAL LOW (ref 83.0–108.0)

## 2020-08-30 LAB — CULTURE, BLOOD (ROUTINE X 2)
Culture: NO GROWTH
Special Requests: ADEQUATE

## 2020-08-30 LAB — GLUCOSE, CAPILLARY
Glucose-Capillary: 186 mg/dL — ABNORMAL HIGH (ref 70–99)
Glucose-Capillary: 196 mg/dL — ABNORMAL HIGH (ref 70–99)
Glucose-Capillary: 199 mg/dL — ABNORMAL HIGH (ref 70–99)
Glucose-Capillary: 203 mg/dL — ABNORMAL HIGH (ref 70–99)
Glucose-Capillary: 216 mg/dL — ABNORMAL HIGH (ref 70–99)
Glucose-Capillary: 218 mg/dL — ABNORMAL HIGH (ref 70–99)
Glucose-Capillary: 225 mg/dL — ABNORMAL HIGH (ref 70–99)

## 2020-08-30 LAB — HEPARIN LEVEL (UNFRACTIONATED): Heparin Unfractionated: 1.28 IU/mL — ABNORMAL HIGH (ref 0.30–0.70)

## 2020-08-30 LAB — COMPREHENSIVE METABOLIC PANEL
ALT: 27 U/L (ref 0–44)
AST: 33 U/L (ref 15–41)
Albumin: 2.7 g/dL — ABNORMAL LOW (ref 3.5–5.0)
Alkaline Phosphatase: 58 U/L (ref 38–126)
Anion gap: 11 (ref 5–15)
BUN: 40 mg/dL — ABNORMAL HIGH (ref 8–23)
CO2: 26 mmol/L (ref 22–32)
Calcium: 9.7 mg/dL (ref 8.9–10.3)
Chloride: 115 mmol/L — ABNORMAL HIGH (ref 98–111)
Creatinine, Ser: 0.98 mg/dL (ref 0.44–1.00)
GFR, Estimated: >60 mL/min (ref >60)
Glucose, Bld: 204 mg/dL — ABNORMAL HIGH (ref 70–99)
Potassium: 3.8 mmol/L (ref 3.5–5.1)
Sodium: 152 mmol/L — ABNORMAL HIGH (ref 135–145)
Total Bilirubin: 0.4 mg/dL (ref 0.3–1.2)
Total Protein: 6.7 g/dL (ref 6.5–8.1)

## 2020-08-30 LAB — TROPONIN I (HIGH SENSITIVITY)
Troponin I (High Sensitivity): 293 ng/L (ref <18)
Troponin I (High Sensitivity): 260 ng/L (ref <18)

## 2020-08-30 LAB — APTT: aPTT: 57 seconds — ABNORMAL HIGH (ref 24–36)

## 2020-08-30 LAB — AMMONIA: Ammonia: 31 umol/L (ref 9–35)

## 2020-08-30 MED ORDER — MIDAZOLAM HCL 2 MG/2ML IJ SOLN: 1.0000 mg | INTRAMUSCULAR | Status: DC | PRN

## 2020-08-30 MED ORDER — POLYETHYLENE GLYCOL 3350 17 G PO PACK
17.0000 g | PACK | Freq: Every day | ORAL | Status: DC
  Administered 2020-08-31 – 2020-09-04 (×3): 17 g
  Filled 2020-08-30 (×2): qty 1

## 2020-08-30 MED ORDER — SODIUM CHLORIDE 3 % IN NEBU
4.0000 mL | INHALATION_SOLUTION | Freq: Two times a day (BID) | RESPIRATORY_TRACT | Status: AC
  Administered 2020-08-30 – 2020-09-02 (×5): 4 mL via RESPIRATORY_TRACT
  Filled 2020-08-30 (×5): qty 4

## 2020-08-30 MED ORDER — FENTANYL CITRATE (PF) 100 MCG/2ML IJ SOLN
25.0000 ug | INTRAMUSCULAR | Status: DC | PRN
  Administered 2020-08-30 – 2020-09-02 (×8): 100 ug via INTRAVENOUS
  Filled 2020-08-30 (×8): qty 2

## 2020-08-30 MED ORDER — PROPOFOL 1000 MG/100ML IV EMUL
5.0000 ug/kg/min | INTRAVENOUS | Status: DC
  Filled 2020-08-30: qty 100

## 2020-08-30 MED ORDER — PROPOFOL 1000 MG/100ML IV EMUL
0.0000 ug/kg/min | INTRAVENOUS | Status: DC
  Administered 2020-08-31: 10 ug/kg/min via INTRAVENOUS
  Administered 2020-08-31: 5 ug/kg/min via INTRAVENOUS
  Administered 2020-09-01: 10 ug/kg/min via INTRAVENOUS
  Administered 2020-09-01: 6 ug/kg/min via INTRAVENOUS
  Filled 2020-08-30 (×3): qty 100

## 2020-08-30 MED ORDER — PANTOPRAZOLE SODIUM 40 MG IV SOLR
40.0000 mg | INTRAVENOUS | Status: DC
  Administered 2020-08-30: 40 mg via INTRAVENOUS
  Filled 2020-08-30: qty 40

## 2020-08-30 MED ORDER — NALOXONE HCL 0.4 MG/ML IJ SOLN
0.4000 mg | INTRAMUSCULAR | Status: DC | PRN
  Administered 2020-08-30: 0.4 mg via INTRAVENOUS

## 2020-08-30 MED ORDER — ORAL CARE MOUTH RINSE
15.0000 mL | OROMUCOSAL | Status: DC
  Administered 2020-08-30 – 2020-09-04 (×48): 15 mL via OROMUCOSAL

## 2020-08-30 MED ORDER — DOCUSATE SODIUM 50 MG/5ML PO LIQD
100.0000 mg | Freq: Two times a day (BID) | ORAL | Status: DC
  Administered 2020-08-30 – 2020-09-04 (×5): 100 mg
  Filled 2020-08-30 (×8): qty 10

## 2020-08-30 MED ORDER — NALOXONE HCL 0.4 MG/ML IJ SOLN
INTRAMUSCULAR | Status: AC
  Administered 2020-08-30: 0.4 mg via INTRAVENOUS
  Filled 2020-08-30: qty 1

## 2020-08-30 MED ORDER — NALOXONE HCL 0.4 MG/ML IJ SOLN
INTRAMUSCULAR | Status: AC
  Administered 2020-08-30: 0.4 mg
  Filled 2020-08-30: qty 1

## 2020-08-30 MED ORDER — PANTOPRAZOLE SODIUM 40 MG PO PACK
40.0000 mg | PACK | Freq: Every day | ORAL | Status: DC
  Administered 2020-08-31 – 2020-09-03 (×4): 40 mg
  Filled 2020-08-30 (×6): qty 20

## 2020-08-30 MED ORDER — GUAIFENESIN ER 600 MG PO TB12
1200.0000 mg | ORAL_TABLET | Freq: Two times a day (BID) | ORAL | Status: DC
  Administered 2020-08-30 – 2020-08-31 (×3): 1200 mg via ORAL
  Filled 2020-08-30 (×4): qty 2

## 2020-08-30 MED ORDER — PROPOFOL 1000 MG/100ML IV EMUL
INTRAVENOUS | Status: AC
  Administered 2020-08-30: 10 ug/kg/min via INTRAVENOUS
  Filled 2020-08-30: qty 100

## 2020-08-30 MED ORDER — CHLORHEXIDINE GLUCONATE 0.12% ORAL RINSE (MEDLINE KIT)
15.0000 mL | Freq: Two times a day (BID) | OROMUCOSAL | Status: DC
  Administered 2020-08-30 – 2020-09-04 (×11): 15 mL via OROMUCOSAL

## 2020-08-30 MED ORDER — LEVOTHYROXINE SODIUM 25 MCG PO TABS
175.0000 ug | ORAL_TABLET | Freq: Every day | ORAL | Status: DC
  Administered 2020-08-31 – 2020-09-01 (×2): 175 ug via ORAL
  Filled 2020-08-30 (×2): qty 1

## 2020-08-30 MED ORDER — FENTANYL CITRATE (PF) 100 MCG/2ML IJ SOLN
25.0000 ug | INTRAMUSCULAR | Status: DC | PRN
  Administered 2020-09-04: 25 ug via INTRAVENOUS
  Filled 2020-08-30: qty 2

## 2020-08-30 NOTE — Progress Notes (Signed)
MD & Rt to bedside for intubation.   20 mg etomidate given @ 1037 120 mg of succinylcholine given @ 1037  Intubated at 1038 with 7.5 ETT. Color change noted. 23 @ lip.   CXR order to verify placement.

## 2020-08-30 NOTE — Progress Notes (Signed)
[facility]  Discussd with Dr. Arbutus Leas.  Worsening mental status, hypoxemia, new whole lung white out from mucus plugging, weak to no cough, not improving with narcan.  Recommended intubation and transfer to [facility] for further evaluation assuming family consent.  Heber [place], MD [place] [facility]M Pager: [phone] Cell: [phone] If no response, please call [phone] until 7pm After 7:00 pm call Elink  [phone]

## 2020-08-30 NOTE — Progress Notes (Signed)
Report called to receiving RN. Daughter at bedside, notified of bed assignment at [facility]. Daughter to take patient's personal belongings with her. [facility] in route.

## 2020-08-30 NOTE — Progress Notes (Addendum)
   Palliative Medicine Inpatient Follow Up Note  Palliative care consulted for goals of care conversations by Dr. Thomes Dinning prior to transfer. I reviewed prior PMT notes from 2019 at which time she had recently been extubated and family wanted full measures to maintain life inclusive of reintubation.   Per note review Dr. Arbutus Leas discussed goals of care with patients daughter, [name] indicating again, full scope of care.  I reached out to Dr. Kendrick Fries this afternoon. He shares that we can hold off on consulting [name] at this time.   PMT will remove the present consult but please reach out if our services are needed at anytime moving forward.  No Charge ______________________________________________________________________________________ [name]  Palliative Medicine Team Team Cell Phone: [phone] Please utilize secure chat with additional questions, if there is no response within 30 minutes please call the above phone number  Palliative Medicine Team providers are available by phone from 7am to 7pm daily and can be reached through the team cell phone.  Should this patient require assistance outside of these hours, please call the patient's attending physician.

## 2020-08-30 NOTE — H&P (Signed)
NAME:  [name], MRN:  [id number], DOB:  [birth year], LOS: 6 ADMISSION DATE:  08/23/2020, CONSULTATION DATE:  2/27 REFERRING MD:  Tat, CHIEF COMPLAINT:  Dyspnea   Brief History:  70 y/o female admitted to [facility] on 20 February in the setting of septic shock from pneumonia requiring intubation, extubated on February 25.  Transferred to [facility] on February 27 in the setting of worsening mental status, worsening respiratory failure due to a left lung mucous plug.  History of Present Illness:  70 y/o female admitted to [facility] on 20 February in the setting of septic shock from pneumonia requiring intubation, extubated on February 25.  Transferred to [facility] on February 27 in the setting of worsening mental status, worsening respiratory failure due to a left lung mucous plug. The patient was intubated upon arrival and could not provide further history.  History was obtained from chart review. She had previously been followed by the hospitalist service as well as critical care medicine.  She had been treated for healthcare associated pneumonia.  Of note, she had a fall at home prior to admission.  She had atrial fibrillation at baseline and had been on amiodarone, after admission this was held for concern of possible amiodarone induced lung toxicity and she was treated with steroids.  She was successfully extubated on February 25, however her mental status declined overnight from February 26 through early February 27.  A blood gas was obtained which did not show hypercarbia.  However after the blood gas was performed oxygenation worsened and her mental status did not improve.  In consultation with the pulmonary critical care service in [place] it was decided that she should be electively intubated and then transferred to our campus for a bronchoscopy.  Past Medical History:  Anemia Barrett's esophagus Systolic heart failure> LVEF 15 to 20% based on  recent echocardiogram from [facility]  Diabetes mellitus type 2 Atrial fibrillation Hyperlipidemia Hypertension Seizure disorder Hypothyroidism  Significant Hospital Events:  2/20 Admit 2/22 stop amiodarone (esr 95) 2/23  Solumedrol trial @ 80 q 12 and reduced to 40 mg q 12h  Pm 2/25  2/25  Trial extubation   Consults:  PCCM  2/21  Procedures:  ETT 2/20 >> 2/25  Rt IJ CVL 2/21 >>  Significant Diagnostic Tests:  CT head 2/21 >> mild atrophy, chronic microvascular disease CT chest 2/21 >> b/l ASD, small b/l effusions Cortisol fasting 2/23  = 25  Micro Data:  COVID 2/20 >> negative Flu PCR 2/20 >> negative MRSA PCR 2/20 >> negative ET 2/22 >  No wbc, few gpc >>> BC x 2 2/22 (while on cefepime)  >>> 1/2 pos strep epi  Urine strep 2/22 >> neg Urine legionella 2/22 >>> neg   Antimicrobials:  Vancomycin 2/20  X one dose    Cefepime 2/20 >>  February 24 Vancomycin 2/24 x one dose   Zosyn 2/24>   Interim History / Subjective:  As above  Objective   Blood pressure 140/73, pulse 77, temperature 99 F (37.2 C), temperature source Axillary, resp. rate 20, height _0  (1.676 m), weight 106 kg, SpO2 100 %.    Vent Mode: PRVC FiO2 (%):  [100 %] 100 % Set Rate:  [20 bmp] 20 bmp Vt Set:  [510 mL] 510 mL PEEP:  [5 cmH20] 5 cmH20 Plateau Pressure:  [21 cmH20-28 cmH20] 21 cmH20   Intake/Output Summary (Last 24 hours) at 08/30/2020 1549 Last data filed at 08/30/2020  1408 Gross per 24 hour  Intake 1670.03 ml  Output 1800 ml  Net -129.97 ml   Filed Weights   08/24/20 0156 08/27/20 0300 08/30/20 1523  Weight: 104 kg 106.9 kg 106 kg    Examination:  General: Chronic  In bed on vent HENT: NCAT ETT in place PULM: Diminished left lung B, vent supported breathing CV: RRR, no mgr GI: BS+, soft, nontender MSK: normal bulk and tone Neuro: sedated on vent  February 27 chest x-ray images independently reviewed showing complete collapse of the left lung, right lung without  infiltrate  Resolved Hospital Problem list     Assessment & Plan:  Acute respiratory failure with hypoxemia due to left mainstem mucous plug Recent healthcare associated pneumonia Full mechanical ventilatory support Daily wake-up assessment and spontaneous breathing trial Pulmonary toilet: We will add hypertonic saline, Mucinex Bag lavage as needed Bronchoscopy today, see note, mucous plug was removed Send mucous plug sample for culture Continue Zosyn  Need for sedation for mechanical ventilation PAD protocol, RASS target 0 to -1 Propofol infusion, as needed fentanyl, as needed Versed  Atrial fibrillation:  Heparin infusion Telemetry monitoring  Septic shock: Shock resolved, needs to complete antibiotics Continue Zosyn  Chronic systolic heart failure: Telemetry monitoring Hold Lasix for now (of note on torsemide at home) Monitor for shock, check coags and CVP as needed  Chronic kidney disease stage IIIa: Monitor BMET and UOP Replace electrolytes as needed  Diabetes mellitus type 2: Monitor blood sugar Sliding scale insulin Continue Lantus  Need for nutrition: Tube feeding  Hypothyroidism: Continue Synthroid  Anemia of chronic disease, baseline hemoglobin 10 Monitor for bleeding Transfuse PRBC for Hgb < 7 gm/dL  History of seizures: Continue Keppra  Dementia: Aricept  Best practice (evaluated daily)  Diet: tube feeding Pain/Anxiety/Delirium protocol (if indicated): as above VAP protocol (if indicated): yes DVT prophylaxis: heparin infusion GI prophylaxis: pantoprazole Glucose control: SSI Mobility: bed rest Disposition:remain in ICU  Goals of Care:  Last date of multidisciplinary goals of care discussion: 2/27 Family and staff present: Dr. Carles Collet, daughter Summary of discussion: full scope of practice Follow up goals of care discussion due: 2/28 > should revisit code status.  Her overall prognosis is poor if she continues to require mechanical  ventilation Code Status: Full  Labs   CBC: Recent Labs  Lab 08/26/20 0422 08/27/20 0410 08/28/20 1128 08/29/20 0416 08/30/20 0732  WBC 11.2* 7.4 14.1* 18.1* 19.2*  HGB 8.8* 8.2* 8.7* 8.9* 9.6*  HCT 28.1* 26.7* 28.1* 29.5* 31.7*  MCV 103.7* 106.8* 106.4* 106.9* 108.9*  PLT 168 153 180 194 935    Basic Metabolic Panel: Recent Labs  Lab 08/25/20 0425 08/25/20 1656 08/26/20 0422 08/27/20 0410 08/28/20 1128 08/29/20 0416 08/30/20 0732  NA 142  --  147* 144 145 151* 152*  K 3.4*   < > 3.2* 3.8 3.5 3.4* 3.8  CL 100  --  109 109 110 114* 115*  CO2 30  --  _0 GLUCOSE 320*  --  170* 352* 284* 194* 204*  BUN 37*  --  28* 38* 50* 43* 40*  CREATININE 1.56*  --  0.96 1.16* 1.22* 1.11* 0.98  CALCIUM 8.6*  --  8.7* 8.6* 9.0 10.1 9.7  MG 2.4  --   --  2.3  --  2.5*  --   PHOS 2.8  --   --   --   --   --   --    < > =  values in this interval not displayed.   GFR: Estimated Creatinine Clearance: 64.8 mL/min (by C-G formula based on SCr of 0.98 mg/dL). Recent Labs  Lab 08/23/20 2347 08/25/20 0726 08/27/20 0410 08/28/20 1128 08/29/20 0416 08/30/20 0732  WBC  --    < > 7.4 14.1* 18.1* 19.2*  LATICACIDVEN 1.2  --   --   --   --   --    < > = values in this interval not displayed.    Liver Function Tests: Recent Labs  Lab 08/26/20 0422 08/27/20 0410 08/28/20 1128 08/29/20 0416 08/30/20 0732  AST 19 14* 14* 15 33  ALT _0 ALKPHOS 56 53 49 51 58  BILITOT 0.8 0.8 0.3 0.6 0.4  PROT 5.8* 5.8* 6.2* 6.3* 6.7  ALBUMIN 2.4* 2.2* 2.4* 2.5* 2.7*   No results for input(s): LIPASE, AMYLASE in the last 168 hours. Recent Labs  Lab 08/30/20 0732  AMMONIA 31    ABG    Component Value Date/Time   PHART 7.346 (L) 08/30/2020 0900   PCO2ART 48.7 (H) 08/30/2020 0900   PO2ART 82.0 (L) 08/30/2020 0900   HCO3 24.7 08/30/2020 0900   TCO2 27 01/26/2015 1132   ACIDBASEDEF 1.0 01/26/2015 1132   O2SAT 93.9 08/30/2020 0900     Coagulation Profile: Recent  Labs  Lab 08/23/20 2305  INR 1.8*    Cardiac Enzymes: Recent Labs  Lab 08/23/20 2305  CKTOTAL 49    HbA1C: Hgb A1c MFr Bld  Date/Time Value Ref Range Status  08/24/2020 10:02 AM 7.9 (H) 4.8 - 5.6 % Final    Comment:    (NOTE) Pre diabetes:          5.7%-6.4%  Diabetes:              >6.4%  Glycemic control for   <7.0% adults with diabetes   02/16/2018 10:43 AM 9.1 (H) 4.8 - 5.6 % Final    Comment:    (NOTE) Pre diabetes:          5.7%-6.4% Diabetes:              >6.4% Glycemic control for   <7.0% adults with diabetes     CBG: Recent Labs  Lab 08/30/20 0004 08/30/20 0310 08/30/20 0726 08/30/20 1115 08/30/20 1533  GLUCAP 225* 218* 186* 203* 216*    Review of Systems:   Cannot obtain due to intubation  Past Medical History:  She,  has a past medical history of Abnormal pulmonary function test, Anemia, Anxiety, Arthritis, Barrett's esophagus, Chest pain, Chronic anticoagulation, Chronic combined systolic and diastolic CHF (congestive heart failure) (HCC), Chronic LBP, Diabetes mellitus, type 2 ([place]), Dysrhythmia, Gastroparesis, GERD (gastroesophageal reflux disease), Hiatal hernia, Hyperlipidemia, Hypertension, Hypothyroid, IBS (irritable bowel syndrome), Obesity, OSA on CPAP, Paroxysmal atrial fibrillation ([place]), Pulmonary hypertension ([place]) (01/2015), Seizures ([place]), and Syncope.   Surgical History:   Past Surgical History:  Procedure Laterality Date  . BACK SURGERY  1996  . BIOPSY N/A 11/08/2013   Procedure: BIOPSY  / Tissue sampling / ulcers present in small intestine;  Surgeon: Danie Binder, MD;  [facility];  Service: Endoscopy;  Laterality: N/A;  . CARDIAC CATHETERIZATION  2002  . CARDIAC CATHETERIZATION N/A 01/26/2015   Procedure: Right Heart Cath;  Surgeon: Larey Dresser, MD;  Location: [facility] CV LAB;  Service: Cardiovascular;  Laterality: N/A;  . CARDIOVASCULAR STRESS TEST  2008   Stress nuclear study  . CARDIOVERSION N/A 03/06/2015  Procedure: CARDIOVERSION;  Surgeon: Larey Dresser, MD;  Location: [facility];  Service: Cardiovascular;  Laterality: N/A;  . CARPAL [place]  . COLONOSCOPY  11/2011   Dr. Oneida Alar: Internal hemorrhoids, mild diverticulosis. Random colon biopsies negative.  . COLONOSCOPY N/A 11/08/2013   SLF: Normal mucosa in the terminal ileum/The colon IS redundant/  Moderate diverticulosis throughout the entire colon. ileum bx benign. colon bx benign  . ESOPHAGOGASTRODUODENOSCOPY  2008   Barrett's without dysplasia. esphagus dilated. antral erosions, h.pylori serologies negative.  . ESOPHAGOGASTRODUODENOSCOPY  11/2011   Dr. Oneida Alar: Barrett's esophagus, mild gastritis, diverticulum in the second portion of the duodenum repeat EGD 3 years. Small bowel biopsies negative. Gastric biopsy show reactive gastropathy but no H. pylori. Esophageal biopsies consistent with GERD. Next EGD 11/2014  . ESOPHAGOGASTRODUODENOSCOPY N/A 11/21/2014   TML:YYTK non-erosive gastritis/irregular z-line  . GIVENS CAPSULE STUDY  12/07/2011   Proximal small bowel, rare AVM. Distal small bowel, multiple ulcers noted  . GIVENS CAPSULE STUDY N/A 09/27/2013   Distal small bowel ulcers extending to TI.  Marland Kitchen GIVENS CAPSULE STUDY N/A 10/10/2013   Procedure: GIVENS CAPSULE STUDY;  Surgeon: Danie Binder, MD;  [facility];  Service: Endoscopy;  Laterality: N/A;  7:30  . IR KYPHO THORACIC WITH BONE BIOPSY  02/09/2018  . KNEE ARTHROSCOPY WITH MEDIAL MENISECTOMY Right 06/09/2016   Procedure: KNEE ARTHROSCOPY WITH MEDIAL MENISECTOMY;  Surgeon: Carole Civil, MD;  Location: [facility];  Service: Orthopedics;  Laterality: Right;  medial and lateral menisectomy  . LAMINECTOMY  1995   L4-L5  . LAPAROSCOPIC CHOLECYSTECTOMY  1990s  . LEFT HEART CATHETERIZATION WITH CORONARY ANGIOGRAM  01/10/2014   Procedure: LEFT HEART CATHETERIZATION WITH CORONARY ANGIOGRAM;  Surgeon: Sinclair Grooms, MD;  Location: [facility] CATH LAB;  Service:  Cardiovascular;;  . RIGHT HEART CATHETERIZATION N/A 01/10/2014   Procedure: RIGHT HEART CATH;  Surgeon: Sinclair Grooms, MD;  [facility] CATH LAB;  Service: Cardiovascular;  Laterality: N/A;  . TOTAL ABDOMINAL HYSTERECTOMY  1999  . TRACHEOSTOMY TUBE PLACEMENT N/A 09/01/2017   Procedure: TRACHEOSTOMY;  Surgeon: Rozetta Nunnery, MD;  Location: [facility];  Service: ENT;  Laterality: N/A;     Social History:   reports that she quit smoking about 37 years ago. Her smoking use included cigarettes. She started smoking about 54 years ago. She has a 3.75 pack-year smoking history. She has never used smokeless tobacco. She reports that she does not drink alcohol and does not use drugs.   Family History:  Her family history includes Alzheimer's disease in her mother; Breast cancer in her sister; Heart attack in her mother; Hypertension in her mother and another family member; Stroke in her mother. There is no history of Heart disease or Colon cancer.   Allergies Allergies  Allergen Reactions  . Citalopram Hydrobromide Other (See Comments)    Dyskinesia Other reaction(s): Other (See Comments) Dyskinesia  . Codeine Nausea And Vomiting and Other (See Comments)    HALLUCINATIONS Other reaction(s): Unknown Other reaction(s): Other (See Comments) HALLUCINATIONS   . Hydromorphone Hcl Other (See Comments)    Made her pass out Other reaction(s): Other (See Comments) Made her pass out  . Metoclopramide Other (See Comments)    DYSKINESIA Other reaction(s): Other (See Comments) DYSKINESIA  . Hyoscyamine     MADE DIARRHEA WORSE     Home Medications  Prior to Admission medications   Medication Sig Start Date End Date Taking? Authorizing Provider  acetaminophen (TYLENOL) 500  MG tablet Take 500 mg by mouth as needed for moderate pain or headache.    Yes [provider]  amiodarone (PACERONE) 200 MG tablet TAKE 1 TABLET BY MOUTH EVERY MORNING 03/03/20  Yes Strader, [place] M, PA-C   apixaban (ELIQUIS) 5 MG TABS tablet Take 1 tablet (5 mg total) by mouth 2 (two) times daily. 02/14/20  Yes BranchAlphonse Guild, MD  atorvastatin (LIPITOR) 40 MG tablet Take 40 mg by mouth daily. 08/22/20  Yes [provider]  Cholecalciferol (VITAMIN D3) 2000 units capsule Take 2,000 Units by mouth daily. 10/25/17  Yes [provider]  Cyanocobalamin 5000 MCG SUBL Take 1 tablet by mouth daily.   Yes [provider]  donepezil (ARICEPT) 5 MG tablet Take 5 mg by mouth daily.   Yes [provider]  DULoxetine (CYMBALTA) 30 MG capsule Take 30 mg by mouth 2 (two) times daily. 11/24/17  Yes [provider]  gabapentin (NEURONTIN) 400 MG capsule Take 400-800 mg by mouth 2 (two) times daily. 1 tablet in am and 2 at bedtime 07/18/17  Yes [provider]  glipiZIDE (GLUCOTROL) 10 MG tablet Take 10 mg by mouth daily. 11/24/17  Yes [provider]  hydrALAZINE (APRESOLINE) 25 MG tablet Take 25 mg by mouth every 8 (eight) hours. 08/22/20  Yes [provider]  HYDROcodone-acetaminophen (NORCO) 10-325 MG tablet Take 0.5 tablets by mouth at bedtime as needed for moderate pain. 07/02/18  Yes [provider]  insulin glargine (LANTUS) 100 UNIT/ML injection Inject 0.55 mLs (55 Units total) into the skin at bedtime. Patient taking differently: Inject 20-30 Units into the skin at bedtime. 08/22/17  Yes Tat, Shanon Brow, MD  JARDIANCE 10 MG TABS tablet Take 10 mg by mouth daily. 08/22/20  Yes [provider]  Lactase 9000 units CHEW 1 PO Q4H WHEN YOU EAT ICE CREAM OR CHEESE Patient taking differently: Chew 1 tablet by mouth every 4 (four) hours as needed (when eating dairy products). 09/05/19  Yes Fields, Sandi L, MD  levETIRAcetam (KEPPRA) 1000 MG tablet Take 1,500 mg by mouth 2 (two) times daily.    Yes [provider]  levothyroxine (SYNTHROID) 175 MCG tablet Take 1 tablet by mouth daily. 05/20/19  Yes [provider]   loperamide (IMODIUM) 2 MG capsule 1 PO Q4H PRN LOOSE STOOLS Patient taking differently: Take 4 mg by mouth every 4 (four) hours as needed. 1 PO Q4H PRN LOOSE STOOLS 09/05/19  Yes Fields, Sandi L, MD  LORazepam (ATIVAN) 1 MG tablet Take 0.5 mg by mouth at bedtime as needed for anxiety or sleep. for anxiety 11/24/17  Yes [provider]  magnesium oxide (MAG-OX) 400 MG tablet Take 1 tablet (400 mg total) by mouth 2 (two) times daily. 12/26/17  Yes Branch, Alphonse Guild, MD  metFORMIN (GLUCOPHAGE) 500 MG tablet Take 500 mg by mouth 2 (two) times daily with a meal.   Yes [provider]  Multiple Vitamin (MULTIVITAMIN WITH MINERALS) TABS tablet Take 1 tablet by mouth daily.   Yes [provider]  ondansetron (ZOFRAN) 4 MG tablet TAKE 1 TABLET BY MOUTH FOUR TIMES DAILY AS NEEDED FOR NAUSEA Patient taking differently: Take 4 mg by mouth 4 (four) times daily as needed for nausea. 01/10/18  Yes Annitta Needs, NP  OXYGEN 2 L daily.    Yes [provider]  pantoprazole (PROTONIX) 40 MG tablet TAKE ONE TABLET BY MOUTH TWICE DAILY BEFORE APPLY MEAL Patient taking differently: Take 40 mg by mouth  2 (two) times daily. 02/07/17  Yes Annitta Needs, NP  potassium chloride (KLOR-CON) 10 MEQ tablet Take 20 mEq by mouth daily.   Yes [provider]  torsemide (DEMADEX) 20 MG tablet TAKE TWO TABLETS BY MOUTH TWICE DAILY Patient taking differently: Take 100 mg by mouth daily. 07/29/20  Yes Arnoldo Lenis, MD  dicyclomine (BENTYL) 20 MG tablet 1 PO QAC TID 08/26/20   Carlis Stable, NP  SM MELATONIN 3 MG TABS tablet Take 3 mg by mouth at bedtime as needed for sleep. 08/22/20   [provider]     Critical care time: 45 minutes     Roselie Awkward, MD [place] [facility] Pager: [phone] Cell: [phone] If no response, please call [phone] until 7pm After 7:00 pm call [place]  [phone]

## 2020-08-30 NOTE — Progress Notes (Addendum)
PROGRESS NOTE  [name] ZOX:[id number] DOB: 07-20-1950 DOA: 08/23/2020 PCP: Gareth Morgan, MD  Brief History:  70 year old female admitted to the hospital with acute respiratory failure secondary to possible pneumonia. Patient was intubated in the emergency room. She was noted to be in septic shock and was started on vasopressors and antibiotics. History is further complicated by recent diagnosis of cardiomyopathy and ejection fraction 15 to 20%. She also has atrial fibrillation on amiodarone and Eliquis. PCCM following.  Amio was stopped due to concerns it may have contributed to pulm toxicity.  Patient recently hospitalized at [facility] with ACS/NSTEMI from 08/17/20 to 08/21/20. No evidence of ischemic cardiomyopathy based on left heart catheterization 2/15. Patient was switched to PO diuretics (torsemide 100 mg bid) on 2/17, after improvement in volume status. Beta-blockers were not introduced on discharge since patient has had moments of hypotension. She was d/ced on hydralazine 25 mg tid, Jardiance 10 mg daily, and torsemide 100 mg daily. She was continued on amio 200 mg daily. She was d/ced back on her usual 2L [place]. It was felt her CM was due to uncontrolled HTN and afib with RVR.  Intubated 2/20 and extubated 2/23.  Post extubation, the patient remained pleasantly confused, but remained hemodynamically stable without any respiratory distress.  On evening 2/26-2/27, patient was more somnolent and difficult to arouse.  Further work up was undertaken as below.  Assessment/Plan: Acuteon chronicrespiratory failure with hypoxia and hypercapnia -Secondary to pneumoniain the setting of underlying OSA -Patient was intubated on 2/20 and is on mechanical ventilation -Appreciate PCCM assistance -Other etiologies to be considered as amiodarone toxicity -started IV solumedrol 2/23 -weaning off diprivan in anticipation for weaning vent -2/25--personally reviewed CXR--LLL  opacity, increased interstitial markings at bases -08/28/20--extubated>>3LNC -minimize opioids, hypnotic meds  Septic shock -Secondary to pneumonia -MRSA PCR is negative, vancomycin discontinued -Unfortunately, she is continued to have fevers -Blood cultures have been sent -D/Ccefepime -started zosyn 2/24 [facility] Levophed 2/23 -sepsis physiology resolved  Acute metabolic Encephalopathy -patient minimally responsive to protopathic stimuli -personally reviewed CXR--mucus plug/collapse left lung -08/30/20@0043  ABG--7.428/39/70/25 (3L) -08/30/20@0100  CT brain--no hemorrhage or progressive infarct -ammonia = 31 -obtain EKG -UA and urine culture -Na 152 partly contributing -repeat ABG -continue D5W  Acute Ischemic Stroke -08/28/20 MR brain--acute/subacute 2mm lacunar infarct at R-centrum semiovale -case discussed with neurology, Dr. Wilford Corner -ok to continue IV heparin, hold antiplatelet agents -due to small vessel disease -08/24/20 A1C--7.9 -check lipid panel-- LDL 88 -speech eval--remain npo -PT eval -Echo--EF 50-55%, no WMA, mild decrease RV fxn; mild MR -carotid US--unable to assess the right; left-no hemodynamically significant stenosis  Bacteremia -contaminant -Staph epi in one of two sets -d/c vanc  chronicsystolic congestive heart failure -Recent echocardiogram at [facility] showed ejection fraction of 15 to 20% -No evidence of ischemic cardiomyopathy based on heart catheterization on 2/15 at [facility] -She was discharged from [facility] on torsemide 100 mg twice daily -Currently, CVP is7-9and blood pressures of also been low, will hold off on resuming diuretics at this time. -remains clinically euvolemic  Bradycardia/Afib with Slow ventricular response -Patient noted to have episodes of bradycardia with pauses -EKG shows prolonged QT interval, and diffuse T wave inversions -hold off on further amiodarone -08/17/20 Echo--EF 15-20%, mod TR, severe elevated PASP -Replace  electrolytes -bradycardia partly due to precedex, diprivan -If heart rate sustains bradycardic, may need to consider continuing on low-dose dobutamine/Levophed. 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 -HR improved off diprivan and precedex  chronic kidney disease stage IIIa -Baseline creatinine approximately1.1-1.4 -Continue to monitor  Chronic atrial fibrillation -Anticoagulated with Eliquis -Shewason amiodarone which has been d/ced -She is having episodes of bradycardia, will hold off on further amio for now; also concerned about amio pulm toxicity -HR controlled with IV lopressor  DiabetesMellitus type 2 -Blood sugars elevated -08/17/20 A1C--7.2 -Continue Lantus to20units -Continue on sliding scale--increase to moderate scale -holding metformin and glipizide  FEN--Hypernatremia -hypokalemia-->repleted -check Mag-2.3 -remains hypernatremic -start D5W  Hypothyroidism -Continue on Synthroid>>IV -TSH 0.638  Anemia of chronic disease -Baseline Hgb ~10 -Transfuse for hemoglobin less than 7 or if she has significant bleeding -Hgb now stable ~8, likely dilution  History of seizures -Continue on Keppra  Dementiawithout behavioral disturbance -Continue Aricept       Status is: Inpatient  Remains inpatient appropriate due to IV therapy and hemodynamic instability  Dispo: The patient is from:Home Anticipated d/c is BJ:[facility]WG Anticipated d/c date is: 3 days Patient currently is not medically stable to d/c. Difficult to place patient No        Family Communication:Daughter updatedat bedside 2/27  Consultants:PCCM  Code Status: FULL  DVT Prophylaxis:IV heparin  Procedures: As Listed in Progress Note Above  Antibiotics: Cefepime 2/21>>2/24 Zosyn 2/24>>>     The patient is critically ill with multiple organ systems failure and requires high  complexity decision making for assessment and support, frequent evaluation and titration of therapies, application of advanced monitoring technologies and extensive interpretation of multiple databases.  Critical care time - 35 mins.    Subjective: Patient somnolent.  Occasionally opens eyes to protopathic stimuli.  Remainder ROS unobtainable  Objective: Vitals:   08/30/20 0730 08/30/20 0745 08/30/20 0800 08/30/20 0815  BP: (!) 181/99 (!) 178/84 (!) 161/86 127/70  Pulse: 87 86 (!) 43 74  Resp: (!) 29 (!) 38 (!) 44 (!) 35  Temp:      TempSrc:      SpO2: 95% 98% 99% 100%  Weight:      Height:        Intake/Output Summary (Last 24 hours) at 08/30/2020 0845 Last data filed at 08/30/2020 0809 Gross per 24 hour  Intake 2018.34 ml  Output 1350 ml  Net 668.34 ml   Weight change:  Exam:   General:  Pt is alert, follows commands appropriately, not in acute distress  HEENT: No icterus, No thrush, No neck mass, [place]/AT  Cardiovascular: RRR, S1/S2, no rubs, no gallops  Respiratory: diminshed BS on R.  Bibasilar rales  Abdomen: Soft/+BS, non tender, non distended, no guarding  Extremities: Nonpitting edema, No lymphangitis, No petechiae, No rashes, no synovitis   Data Reviewed: I have personally reviewed following labs and imaging studies Basic Metabolic Panel: Recent Labs  Lab 08/25/20 0425 08/25/20 1656 08/26/20 0422 08/27/20 0410 08/28/20 1128 08/29/20 0416 08/30/20 0732  NA 142  --  147* 144 145 151* 152*  K 3.4*   < > 3.2* 3.8 3.5 3.4* 3.8  CL 100  --  109 109 110 114* 115*  CO2 30  --  27 24 23 25 26   GLUCOSE 320*  --  170* 352* 284* 194* 204*  BUN 37*  --  28* 38* 50* 43* 40*  CREATININE 1.56*  --  0.96 1.16* 1.22* 1.11* 0.98  CALCIUM 8.6*  --  8.7* 8.6* 9.0 10.1 9.7  MG 2.4  --   --  2.3  --  2.5*  --   PHOS 2.8  --   --   --   --   --   --    < > =  values in this interval not displayed.   Liver Function Tests: Recent Labs  Lab 08/26/20 0422 08/27/20 0410  08/28/20 1128 08/29/20 0416 08/30/20 0732  AST 19 14* 14* 15 33  ALT 20 17 18 19 27   ALKPHOS 56 53 49 51 58  BILITOT 0.8 0.8 0.3 0.6 0.4  PROT 5.8* 5.8* 6.2* 6.3* 6.7  ALBUMIN 2.4* 2.2* 2.4* 2.5* 2.7*   No results for input(s): LIPASE, AMYLASE in the last 168 hours. Recent Labs  Lab 08/30/20 0732  AMMONIA 31   Coagulation Profile: Recent Labs  Lab 08/23/20 2305  INR 1.8*   CBC: Recent Labs  Lab 08/26/20 0422 08/27/20 0410 08/28/20 1128 08/29/20 0416 08/30/20 0732  WBC 11.2* 7.4 14.1* 18.1* 19.2*  HGB 8.8* 8.2* 8.7* 8.9* 9.6*  HCT 28.1* 26.7* 28.1* 29.5* 31.7*  MCV 103.7* 106.8* 106.4* 106.9* 108.9*  PLT 168 153 180 194 201   Cardiac Enzymes: Recent Labs  Lab 08/23/20 2305  CKTOTAL 49   BNP: Invalid input(s): POCBNP CBG: Recent Labs  Lab 08/29/20 1715 08/29/20 2002 08/30/20 0004 08/30/20 0310 08/30/20 0726  GLUCAP 261* 238* 225* 218* 186*   HbA1C: No results for input(s): HGBA1C in the last 72 hours. Urine analysis:    Component Value Date/Time   COLORURINE AMBER (A) 08/23/2020 2346   APPEARANCEUR HAZY (A) 08/23/2020 2346   LABSPEC 1.011 08/23/2020 2346   PHURINE 5.0 08/23/2020 2346   GLUCOSEU >=500 (A) 08/23/2020 2346   HGBUR NEGATIVE 08/23/2020 2346   BILIRUBINUR NEGATIVE 08/23/2020 2346   KETONESUR NEGATIVE 08/23/2020 2346   PROTEINUR NEGATIVE 08/23/2020 2346   UROBILINOGEN 0.2 02/05/2015 1700   NITRITE NEGATIVE 08/23/2020 2346   LEUKOCYTESUR NEGATIVE 08/23/2020 2346   Sepsis Labs: @LABRCNTIP (procalcitonin:4,lacticidven:4) ) Recent Results (from the past 240 hour(s))  Resp Panel by RT-PCR (Flu A&B, Covid) Nasopharyngeal Swab     Status: None   Collection Time: 08/23/20 11:45 PM   Specimen: Nasopharyngeal Swab; Nasopharyngeal(NP) swabs in vial transport medium  Result Value Ref Range Status   SARS Coronavirus 2 by RT PCR NEGATIVE NEGATIVE Final    Comment: (NOTE) SARS-CoV-2 target nucleic acids are NOT DETECTED.  The SARS-CoV-2 RNA  is generally detectable in upper respiratory specimens during the acute phase of infection. The lowest concentration of SARS-CoV-2 viral copies this assay can detect is 138 copies/mL. A negative result does not preclude SARS-Cov-2 infection and should not be used as the sole basis for treatment or other patient management decisions. A negative result may occur with  improper specimen collection/handling, submission of specimen other than nasopharyngeal swab, presence of viral mutation(s) within the areas targeted by this assay, and inadequate number of viral copies(<138 copies/mL). A negative result must be combined with clinical observations, patient history, and epidemiological information. The expected result is Negative.  Fact Sheet for Patients:  BloggerCourse.com  Fact Sheet for Healthcare Providers:  SeriousBroker.it  This test is no t yet approved or cleared by the [place] FDA and  has been authorized for detection and/or diagnosis of SARS-CoV-2 by FDA under an Emergency Use Authorization (EUA). This EUA will remain  in effect (meaning this test can be used) for the duration of the COVID-19 declaration under Section 564(b)(1) of the Act, 21 U.S.C.section 360bbb-3(b)(1), unless the authorization is terminated  or revoked sooner.       Influenza A by PCR NEGATIVE NEGATIVE Final   Influenza B by PCR NEGATIVE NEGATIVE Final    Comment: (NOTE) The Xpert Xpress SARS-CoV-2/FLU/RSV plus assay is intended  as an aid in the diagnosis of influenza from Nasopharyngeal swab specimens and should not be used as a sole basis for treatment. Nasal washings and aspirates are unacceptable for Xpert Xpress SARS-CoV-2/FLU/RSV testing.  Fact Sheet for Patients: BloggerCourse.com  Fact Sheet for Healthcare Providers: SeriousBroker.it  This test is not yet approved or cleared by the  [place] FDA and has been authorized for detection and/or diagnosis of SARS-CoV-2 by FDA under an Emergency Use Authorization (EUA). This EUA will remain in effect (meaning this test can be used) for the duration of the COVID-19 declaration under Section 564(b)(1) of the Act, 21 U.S.C. section 360bbb-3(b)(1), unless the authorization is terminated or revoked.  Performed at [facility], [address] [place], [place] [address]   MRSA PCR Screening     Status: None   Collection Time: 08/24/20  9:57 AM   Specimen: Nasal Mucosa; Nasopharyngeal  Result Value Ref Range Status   MRSA by PCR NEGATIVE NEGATIVE Final    Comment:        The GeneXpert MRSA Assay (FDA approved for NASAL specimens only), is one component of a comprehensive MRSA colonization surveillance program. It is not intended to diagnose MRSA infection nor to guide or monitor treatment for MRSA infections. Performed at [facility], [address], [place], [place] [address]   Culture, blood (routine x 2)     Status: Abnormal   Collection Time: 08/25/20  1:55 PM   Specimen: BLOOD RIGHT HAND  Result Value Ref Range Status   Specimen Description   Final    BLOOD RIGHT HAND Performed at [facility], [address], [place], [place] [address]    Special Requests   Final    BOTTLES DRAWN AEROBIC AND ANAEROBIC Blood Culture adequate volume Performed at [facility], [address] [place], [place] [address]    Culture  Setup Time   Final    GRAM POSITIVE COCCI AEROBIC BOTTLE Gram Stain Report Called to,Read Back By and Verified With: LOOMIS,K. @1806  08/27/20 BILLINGSLEY,L CRITICAL RESULT CALLED TO, READ BACK BY AND VERIFIED WITH: H TETREAULT RN 08/28/20 0355 JDW    Culture (A)  Final    STAPHYLOCOCCUS EPIDERMIDIS THE SIGNIFICANCE OF ISOLATING THIS ORGANISM FROM A SINGLE SET OF BLOOD CULTURES WHEN MULTIPLE SETS ARE DRAWN IS UNCERTAIN. PLEASE NOTIFY THE MICROBIOLOGY DEPARTMENT WITHIN ONE WEEK IF SPECIATION AND  SENSITIVITIES ARE REQUIRED. Performed at [facility], [address], [place], [place]    Report Status 08/29/2020 FINAL  Final  Culture, blood (routine x 2)     Status: None (Preliminary result)   Collection Time: 08/25/20  1:55 PM   Specimen: BLOOD  Result Value Ref Range Status   Specimen Description BLOOD RIGHT ANTECUBITAL  Final   Special Requests   Final    BOTTLES DRAWN AEROBIC AND ANAEROBIC Blood Culture adequate volume   Culture   Final    NO GROWTH 4 DAYS Performed at [facility], [address] [place], [place]    Report Status PENDING  Incomplete  Blood Culture ID Panel (Reflexed)     Status: Abnormal   Collection Time: 08/25/20  1:55 PM  Result Value Ref Range Status   Enterococcus faecalis NOT DETECTED NOT DETECTED Final   Enterococcus Faecium NOT DETECTED NOT DETECTED Final   Listeria monocytogenes NOT DETECTED NOT DETECTED Final   Staphylococcus species DETECTED (A) NOT DETECTED Final    Comment: CRITICAL RESULT CALLED TO, READ BACK BY AND VERIFIED WITH: H  TETREAULT RN 08/28/20 0355 JDW    Staphylococcus aureus (BCID) NOT DETECTED NOT DETECTED Final   Staphylococcus epidermidis DETECTED (A) NOT DETECTED Final    Comment: Methicillin (oxacillin) resistant coagulase negative staphylococcus. Possible blood culture contaminant (unless isolated from more than one blood culture draw or clinical case suggests pathogenicity). No antibiotic treatment is indicated for blood  culture contaminants. CRITICAL RESULT CALLED TO, READ BACK BY AND VERIFIED WITH: H TETREAULT RN 08/28/20 0355 JDW    Staphylococcus lugdunensis NOT DETECTED NOT DETECTED Final   Streptococcus species NOT DETECTED NOT DETECTED Final   Streptococcus agalactiae NOT DETECTED NOT DETECTED Final   Streptococcus pneumoniae NOT DETECTED NOT DETECTED Final   Streptococcus pyogenes NOT DETECTED NOT DETECTED Final   A.calcoaceticus-baumannii NOT DETECTED NOT DETECTED Final   Bacteroides  fragilis NOT DETECTED NOT DETECTED Final   Enterobacterales NOT DETECTED NOT DETECTED Final   Enterobacter cloacae complex NOT DETECTED NOT DETECTED Final   Escherichia coli NOT DETECTED NOT DETECTED Final   Klebsiella aerogenes NOT DETECTED NOT DETECTED Final   Klebsiella oxytoca NOT DETECTED NOT DETECTED Final   Klebsiella pneumoniae NOT DETECTED NOT DETECTED Final   Proteus species NOT DETECTED NOT DETECTED Final   Salmonella species NOT DETECTED NOT DETECTED Final   Serratia marcescens NOT DETECTED NOT DETECTED Final   Haemophilus influenzae NOT DETECTED NOT DETECTED Final   Neisseria meningitidis NOT DETECTED NOT DETECTED Final   Pseudomonas aeruginosa NOT DETECTED NOT DETECTED Final   Stenotrophomonas maltophilia NOT DETECTED NOT DETECTED Final   Candida albicans NOT DETECTED NOT DETECTED Final   Candida auris NOT DETECTED NOT DETECTED Final   Candida glabrata NOT DETECTED NOT DETECTED Final   Candida krusei NOT DETECTED NOT DETECTED Final   Candida parapsilosis NOT DETECTED NOT DETECTED Final   Candida tropicalis NOT DETECTED NOT DETECTED Final   Cryptococcus neoformans/gattii NOT DETECTED NOT DETECTED Final   Methicillin resistance mecA/C DETECTED (A) NOT DETECTED Final    Comment: CRITICAL RESULT CALLED TO, READ BACK BY AND VERIFIED WITH: H TETREAULT RN 08/28/20 0355 JDW Performed at [facility], [address] [place], [place] [address]   Culture, Respiratory w Gram Stain     Status: None   Collection Time: 08/25/20  2:36 PM   Specimen: Tracheal Aspirate; Respiratory  Result Value Ref Range Status   Specimen Description   Final    TRACHEAL ASPIRATE Performed at [facility], [address] [place], [place] [address]    Special Requests   Final    NONE Performed at [facility], [address] [place], [place] [address]    Gram Stain   Final    NO WBC SEEN FEW GRAM POSITIVE COCCI Performed at [facility], [address] [place], [place] [address]     Culture ABUNDANT ENTEROCOCCUS FAECALIS  Final   Report Status 08/27/2020 FINAL  Final   Organism ID, Bacteria ENTEROCOCCUS FAECALIS  Final      Susceptibility   Enterococcus faecalis - MIC*    AMPICILLIN <=2 SENSITIVE Sensitive     VANCOMYCIN 1 SENSITIVE Sensitive     GENTAMICIN SYNERGY SENSITIVE Sensitive     * ABUNDANT ENTEROCOCCUS FAECALIS     Scheduled Meds: . chlorhexidine  15 mL Mouth Rinse BID  . Chlorhexidine Gluconate Cloth  6 each Topical Daily  . donepezil  5 mg Per Tube QHS  . insulin aspart  0-15 Units Subcutaneous Q4H  . insulin glargine  20 Units Subcutaneous Daily  . levothyroxine  87.5 mcg Intravenous Daily  . mouth rinse  15 mL Mouth Rinse q12n4p  . methylPREDNISolone (SOLU-MEDROL) injection  40 mg Intravenous Q12H  . metoprolol tartrate  2.5 mg Intravenous Q6H  . pantoprazole (PROTONIX) IV  40 mg Intravenous Q24H   Continuous Infusions: . dextrose 5 % with KCl 20 mEq / L Stopped (08/30/20 0449)  . heparin 850 Units/hr (08/30/20 0809)  . levETIRAcetam 400 mL/hr at 08/30/20 0809  . piperacillin-tazobactam (ZOSYN)  IV 12.5 mL/hr at 08/30/20 0809  . sodium chloride Stopped (08/24/20 0107)    Procedures/Studies: CT ABDOMEN PELVIS WO CONTRAST  Result Date: 08/24/2020 CLINICAL DATA:  Status post fall, unresponsive. EXAM: CT CHEST, ABDOMEN AND PELVIS WITHOUT CONTRAST TECHNIQUE: Multidetector CT imaging of the chest, abdomen and pelvis was performed following the standard protocol without IV contrast. COMPARISON:  None. FINDINGS: CT CHEST FINDINGS Cardiovascular: A right internal jugular venous catheter is in place. There is moderate severity calcification of the aortic arch. There is moderate severity cardiomegaly. No pericardial effusion. Mediastinum/Nodes: No enlarged mediastinal, hilar, or axillary lymph nodes. Thyroid gland, trachea, and esophagus demonstrate no significant findings. Lungs/Pleura: Endotracheal and nasogastric tubes are in place. Moderate severity left  upper lobe and bilateral lower lobe atelectasis and/or infiltrate is noted. Small bilateral pleural effusions are noted, right greater than left. No pneumothorax is identified. Musculoskeletal: There is evidence of prior vertebroplasty at the level of T12. A chronic fracture deformity of the sternum is seen. Multilevel degenerative changes are noted throughout the thoracic spine. CT ABDOMEN PELVIS FINDINGS Hepatobiliary: No focal liver abnormality is seen. Status post cholecystectomy. No biliary dilatation. Pancreas: Unremarkable. No pancreatic ductal dilatation or surrounding inflammatory changes. Spleen: Normal in size without focal abnormality. Adrenals/Urinary Tract: Adrenal glands are unremarkable. Kidneys are normal, without renal calculi, focal lesion, or hydronephrosis. A Foley catheter is seen within the urinary bladder. Stomach/Bowel: A nasogastric tube is seen with its distal tip noted within the proximal duodenum. The stomach is otherwise within normal limits. The appendix is not clearly visualized. No evidence of bowel wall thickening, distention, or inflammatory changes. Noninflamed diverticula are seen throughout the sigmoid colon. Vascular/Lymphatic: Aortic atherosclerosis. No enlarged abdominal or pelvic lymph nodes. Reproductive: Status post hysterectomy. No adnexal masses. Other: There is a 2.5 cm x 4.5 cm fat containing umbilical hernia. No abdominopelvic ascites. Musculoskeletal: Multilevel degenerative changes seen throughout the lumbar spine. IMPRESSION: 1. Moderate severity left upper lobe and bilateral lower lobe atelectasis and/or infiltrate. 2. Small bilateral pleural effusions, right greater than left. 3. Moderate severity cardiomegaly. 4. Evidence of prior vertebroplasty at the level of T12. 5. Sigmoid diverticulosis. 6. Fat-containing umbilical hernia. 7. Aortic atherosclerosis. Aortic Atherosclerosis (ICD10-I70.0). Electronically Signed   By: Aram Candela M.D.   On: 08/24/2020  00:39   CT HEAD WO CONTRAST  Result Date: 08/30/2020 CLINICAL DATA:  Mental status changes. Decreased responsiveness. Acute stroke on the right shown by recent MRI. EXAM: CT HEAD WITHOUT CONTRAST TECHNIQUE: Contiguous axial images were obtained from the base of the skull through the vertex without intravenous contrast. COMPARISON:  MRI 08/28/2020 FINDINGS: Brain: Mild age related volume loss. No focal brain insult visible by CT. Tiny white matter infarction in the right posterior frontal region is not resolved. No evidence new or progressive infarction. No hemorrhage, hydrocephalus or extra-axial collection. Vascular: There is atherosclerotic calcification of the major vessels at the base of the brain. Skull: Negative Sinuses/Orbits: Clear/normal Other: None IMPRESSION: No acute finding by CT. Tiny white matter infarction in the right posterior frontal  region shown by MRI is not resolved. No evidence of new or progressive infarction. Electronically Signed   By: Paulina Fusi M.D.   On: 08/30/2020 01:10   CT HEAD WO CONTRAST  Result Date: 08/24/2020 CLINICAL DATA:  Status post fall, unresponsive. EXAM: CT HEAD WITHOUT CONTRAST TECHNIQUE: Contiguous axial images were obtained from the base of the skull through the vertex without intravenous contrast. COMPARISON:  August 15, 2020 FINDINGS: Brain: There is mild cerebral atrophy with widening of the extra-axial spaces and ventricular dilatation. There are areas of decreased attenuation within the white matter tracts of the supratentorial brain, consistent with microvascular disease changes. Vascular: No hyperdense vessel or unexpected calcification. Skull: Normal. Negative for fracture or focal lesion. Sinuses/Orbits: No acute finding. Other: Endotracheal and orogastric tubes are in place. IMPRESSION: No acute intracranial pathology. Electronically Signed   By: Aram Candela M.D.   On: 08/24/2020 00:30   CT Chest Wo Contrast  Result Date:  08/24/2020 CLINICAL DATA:  Status post fall, unresponsive. EXAM: CT CHEST, ABDOMEN AND PELVIS WITHOUT CONTRAST TECHNIQUE: Multidetector CT imaging of the chest, abdomen and pelvis was performed following the standard protocol without IV contrast. COMPARISON:  None. FINDINGS: CT CHEST FINDINGS Cardiovascular: A right internal jugular venous catheter is in place. There is moderate severity calcification of the aortic arch. There is moderate severity cardiomegaly. No pericardial effusion. Mediastinum/Nodes: No enlarged mediastinal, hilar, or axillary lymph nodes. Thyroid gland, trachea, and esophagus demonstrate no significant findings. Lungs/Pleura: Endotracheal and nasogastric tubes are in place. Moderate severity left upper lobe and bilateral lower lobe atelectasis and/or infiltrate is noted. Small bilateral pleural effusions are noted, right greater than left. No pneumothorax is identified. Musculoskeletal: There is evidence of prior vertebroplasty at the level of T12. A chronic fracture deformity of the sternum is seen. Multilevel degenerative changes are noted throughout the thoracic spine. CT ABDOMEN PELVIS FINDINGS Hepatobiliary: No focal liver abnormality is seen. Status post cholecystectomy. No biliary dilatation. Pancreas: Unremarkable. No pancreatic ductal dilatation or surrounding inflammatory changes. Spleen: Normal in size without focal abnormality. Adrenals/Urinary Tract: Adrenal glands are unremarkable. Kidneys are normal, without renal calculi, focal lesion, or hydronephrosis. A Foley catheter is seen within the urinary bladder. Stomach/Bowel: A nasogastric tube is seen with its distal tip noted within the proximal duodenum. The stomach is otherwise within normal limits. The appendix is not clearly visualized. No evidence of bowel wall thickening, distention, or inflammatory changes. Noninflamed diverticula are seen throughout the sigmoid colon. Vascular/Lymphatic: Aortic atherosclerosis. No enlarged  abdominal or pelvic lymph nodes. Reproductive: Status post hysterectomy. No adnexal masses. Other: There is a 2.5 cm x 4.5 cm fat containing umbilical hernia. No abdominopelvic ascites. Musculoskeletal: Multilevel degenerative changes seen throughout the lumbar spine. IMPRESSION: 1. Moderate severity left upper lobe and bilateral lower lobe atelectasis and/or infiltrate. 2. Small bilateral pleural effusions, right greater than left. 3. Moderate severity cardiomegaly. 4. Evidence of prior vertebroplasty at the level of T12. 5. Sigmoid diverticulosis. 6. Fat-containing umbilical hernia. 7. Aortic atherosclerosis. Aortic Atherosclerosis (ICD10-I70.0). Electronically Signed   By: Aram Candela M.D.   On: 08/24/2020 00:40   CT CERVICAL SPINE WO CONTRAST  Result Date: 08/24/2020 CLINICAL DATA:  Status post fall, unresponsive. EXAM: CT CERVICAL SPINE WITHOUT CONTRAST TECHNIQUE: Multidetector CT imaging of the cervical spine was performed without intravenous contrast. Multiplanar CT image reconstructions were also generated. COMPARISON:  October 04, 2012 FINDINGS: Alignment: Normal. Skull base and vertebrae: No acute fracture. No primary bone lesion or focal pathologic process. Soft tissues and  spinal canal: No prevertebral fluid or swelling. No visible canal hematoma. Disc levels: Marked severity endplate sclerosis and osteophyte formation is seen at the levels of C3-C4, C4-C5, C5-C6 and C6-C7. There is marked severity narrowing of the anterior atlantoaxial articulation. Mild to moderate severity intervertebral disc space narrowing is seen at the levels of C5-C6 and C6-C7. Moderate severity bilateral multilevel facet joint hypertrophy is noted. Upper chest: Negative. Other: Endotracheal and orogastric tubes are noted. IMPRESSION: 1. No acute osseous abnormality. 2. Marked severity multilevel degenerative changes, most prominent at the levels of C3-C4, C4-C5, C5-C6 and C6-C7. Electronically Signed   By: Aram Candela M.D.   On: 08/24/2020 00:32   MR BRAIN WO CONTRAST  Result Date: 08/28/2020 CLINICAL DATA:  Mental status change, unknown cause EXAM: MRI HEAD WITHOUT CONTRAST TECHNIQUE: Multiplanar, multiecho pulse sequences of the brain and surrounding structures were obtained without intravenous contrast. COMPARISON:  08/24/2020 and prior. FINDINGS: Please note some image sequences are degraded by motion artifact. Brain: 2 mm right centrum semiovale acute/subacute insult. No intracranial hemorrhage. No midline shift, ventriculomegaly or extra-axial fluid collection. No mass lesion. Mild cerebral atrophy with ex vacuo dilatation. Mild chronic microvascular ischemic changes. Vascular: Normal flow voids. Skull and upper cervical spine: Normal marrow signal. Sinuses/Orbits: Normal orbits. Small sphenoid sinus mucous retention cyst. Bilateral mastoid effusions. Other: None. IMPRESSION: Acute/subacute 2 mm right centrum semiovale lacunar insult. Mild cerebral atrophy and chronic microvascular ischemic changes. These results will be called to the ordering clinician or representative by the Radiologist Assistant, and communication documented in the PACS or [employer]. Electronically Signed   By: Stana Bunting M.D.   On: 08/28/2020 18:52   US Carotid Bilateral  Result Date: 08/29/2020 CLINICAL DATA:  CVA. History hypertension, hyperlipidemia and diabetes. EXAM: BILATERAL CAROTID DUPLEX ULTRASOUND TECHNIQUE: Wallace Cullens scale imaging, color Doppler and duplex ultrasound were performed of bilateral carotid and vertebral arteries in the neck. COMPARISON:  12/28/2015 FINDINGS: Criteria: Quantification of carotid stenosis is based on velocity parameters that correlate the residual internal carotid diameter with NASCET-based stenosis levels, using the diameter of the distal internal carotid lumen as the denominator for stenosis measurement. The following velocity measurements were obtained: RIGHT The right carotid system  and vertebral artery were not evaluated secondary to overlying bandages. LEFT ICA: 55/9 cm/sec CCA: 57/10 cm/sec SYSTOLIC ICA/CCA RATIO:  1.0 ECA: 82 cm/sec LEFT CAROTID ARTERY: There is a minimal amount of eccentric echogenic plaque within the left carotid bulb (image 15 and 17), extending to involve the origin and proximal aspects of the left internal carotid artery (image 25), not resulting in elevated peak systolic velocities within the interrogated course the left internal carotid artery to suggest a hemodynamically significant stenosis. LEFT VERTEBRAL ARTERY:  Antegrade flow IMPRESSION: 1. Minimal amount of left-sided atherosclerotic plaque, morphologically similar to the 2017 examination and again not resulting in hemodynamically significant stenosis. 2. The right carotid system and vertebral artery were not evaluated secondary to overlying bandages. While no hemodynamically significant narrowing was demonstrated on prior carotid Doppler ultrasound performed in 2017, if clinical concern persists, further evaluation with CTA of the head and neck could be performed as indicated. Electronically Signed   By: Simonne Come M.D.   On: 08/29/2020 14:33   DG Chest Port 1 View  Result Date: 08/28/2020 CLINICAL DATA:  Hypoxia EXAM: PORTABLE CHEST 1 VIEW COMPARISON:  August 26, 2020 chest radiograph and August 23, 2020 chest CT FINDINGS: Endotracheal tube tip is 3.8 cm above the carina. Nasogastric tube tip  and side port are below the diaphragm. Central catheter tip is at the cavoatrial junction. No pneumothorax. There is airspace consolidation in the left base with small left pleural effusion. There is mild interstitial edema in the lower lung regions. There is cardiomegaly with pulmonary vascularity within normal limits. There is aortic atherosclerosis. No adenopathy. No bone lesions IMPRESSION: Tube and catheter positions as described without pneumothorax. Persistent left lower lobe airspace consolidation,  concerning combination of atelectasis and pneumonia with small left pleural effusion. Mild bibasilar interstitial edema. Stable cardiomegaly. Aortic Atherosclerosis (ICD10-I70.0). Electronically Signed   By: Bretta Bang III M.D.   On: 08/28/2020 07:47   DG CHEST PORT 1 VIEW  Result Date: 08/26/2020 CLINICAL DATA:  Intubation. EXAM: PORTABLE CHEST 1 VIEW COMPARISON:  08/25/2020. FINDINGS: Endotracheal tube, NG tube, right IJ line in stable position. Stable cardiomegaly. Low lung volumes persistent left base atelectasis/infiltrate. Stable mild bilateral interstitial prominence. Small left pleural effusion again noted. No pneumothorax. IMPRESSION: 1. Lines and tubes in stable position. 2. Low lung volumes. Persistent left base atelectasis/infiltrate and small left pleural effusion. Stable mild bilateral interstitial prominence. 3. Stable cardiomegaly. Electronically Signed   By: Maisie Fus  Register   On: 08/26/2020 05:40   DG Chest Port 1 View  Result Date: 08/25/2020 CLINICAL DATA:  Respiratory failure. EXAM: PORTABLE CHEST 1 VIEW COMPARISON:  Chest x-ray 08/23/2020. FINDINGS: Endotracheal tube, NG tube in stable position. Right IJ line in unchanged position with tip in the right atrium. Cardiomegaly with bilateral interstitial prominence suggesting interstitial edema. Left base atelectasis/infiltrate. No prominent pleural effusion. No pneumothorax. IMPRESSION: 1. Endotracheal tube, NG tube in stable position. Right IJ line in unchanged position with tip in the right atrium 2. Cardiomegaly with bilateral interstitial prominence suggesting interstitial edema. 3.  Left base atelectasis/infiltrate. Electronically Signed   By: Maisie Fus  Register   On: 08/25/2020 05:27   DG Chest Portable 1 View  Result Date: 08/23/2020 CLINICAL DATA:  Status post intubation. EXAM: PORTABLE CHEST 1 VIEW COMPARISON:  August 17, 2020 FINDINGS: An endotracheal tube is seen with its distal tip approximately 2.1 cm from the  carina. A nasogastric tube is noted with its distal end seen within the expected region of the gastric antrum. There is a right internal jugular venous catheter. Its distal tip is seen within the right atrium, approximately 5.2 cm distal to the junction of the superior vena cava and right atrium. Mild atelectasis and/or infiltrate is noted within the right lung base. There is no evidence of a pleural effusion or pneumothorax. The cardiac silhouette is markedly enlarged. The visualized skeletal structures are unremarkable. IMPRESSION: 1. Interval endotracheal tube, nasogastric tube and right internal jugular venous catheter placement and positioning, as described above. 2. Mild right basilar atelectasis and/or infiltrate. Electronically Signed   By: Aram Candela M.D.   On: 08/23/2020 23:56   ECHOCARDIOGRAM COMPLETE  Result Date: 08/29/2020    ECHOCARDIOGRAM REPORT   Patient Name:   HAYZEL RUBERG Date of Exam: 08/29/2020 Medical Rec #:  [id number]     Height:       66.0 in Accession #:    [id number]    Weight:       235.7 lb Date of Birth:  [birth year]     BSA:          2.144 m Patient Age:    69 years      BP:           168/101 mmHg Patient Gender: F  HR:           102 bpm. Exam Location:  Inpatient Procedure: 2D Echo, Cardiac Doppler and Color Doppler Indications:    Stroke I63.9  History:        Patient has prior history of Echocardiogram examinations, most                 recent 11/28/2017. CHF, Arrythmias:Atrial Fibrillation; Risk                 Factors:Diabetes. Acute respiratory failure secondary to                 possible pneumonia. Septic shock. Chronic kidney disease.                 Anemia.  Sonographer:    Leta Jungling RDCS Referring Phys: [phone] DAVID TAT IMPRESSIONS  1. Left ventricular ejection fraction, by estimation, is 50 to 55%. The left ventricle has normal function. The left ventricle has no regional wall motion abnormalities. There is severe left ventricular hypertrophy. Left  ventricular diastolic parameters  are indeterminate.  2. Right ventricular systolic function is mildly reduced. The right ventricular size is mildly enlarged.  3. Left atrial size was severely dilated.  4. Right atrial size was severely dilated.  5. The mitral valve is normal in structure. Mild mitral valve regurgitation. No evidence of mitral stenosis.  6. The tricuspid valve is abnormal. Tricuspid valve regurgitation is moderate.  7. The aortic valve is tricuspid. Aortic valve regurgitation is not visualized. No aortic stenosis is present.  8. The inferior vena cava is normal in size with <50% respiratory variability, suggesting right atrial pressure of 8 mmHg. FINDINGS  Left Ventricle: Left ventricular ejection fraction, by estimation, is 50 to 55%. The left ventricle has normal function. The left ventricle has no regional wall motion abnormalities. The left ventricular internal cavity size was normal in size. There is  severe left ventricular hypertrophy. Left ventricular diastolic parameters are indeterminate. Right Ventricle: The right ventricular size is mildly enlarged. Right vetricular wall thickness was not assessed. Right ventricular systolic function is mildly reduced. The tricuspid regurgitant velocity is 3.96 m/s, and with an assumed right atrial pressure of 15 mmHg, the estimated right ventricular systolic pressure is 77.7 mmHg. Left Atrium: Left atrial size was severely dilated. Right Atrium: Right atrial size was severely dilated. Pericardium: There is no evidence of pericardial effusion. Mitral Valve: The mitral valve is normal in structure. There is mild thickening of the mitral valve leaflet(s). There is mild calcification of the mitral valve leaflet(s). Mild mitral annular calcification. Mild mitral valve regurgitation. No evidence of  mitral valve stenosis. Tricuspid Valve: The tricuspid valve is abnormal. Tricuspid valve regurgitation is moderate . No evidence of tricuspid stenosis. Aortic  Valve: The aortic valve is tricuspid. Aortic valve regurgitation is not visualized. No aortic stenosis is present. Aortic valve mean gradient measures 3.1 mmHg. Aortic valve peak gradient measures 6.9 mmHg. Aortic valve area, by VTI measures 3.30 cm. Pulmonic Valve: The pulmonic valve was not well visualized. Pulmonic valve regurgitation is not visualized. No evidence of pulmonic stenosis. Aorta: The aortic root is normal in size and structure. Pulmonary Artery: Severe pulmonary HTN, PASP is 70. Venous: The inferior vena cava is normal in size with less than 50% respiratory variability, suggesting right atrial pressure of 8 mmHg. IAS/Shunts: No atrial level shunt detected by color flow Doppler.  LEFT VENTRICLE PLAX 2D LVIDd:         3.79 cm  LVIDs:         2.97 cm LV PW:         1.99 cm LV IVS:        1.67 cm LVOT diam:     2.30 cm LV SV:         62 LV SV Index:   29 LVOT Area:     4.15 cm  RIGHT VENTRICLE RV S prime:     8.85 cm/s TAPSE (M-mode): 1.5 cm LEFT ATRIUM              Index       RIGHT ATRIUM           Index LA diam:        5.10 cm  2.38 cm/m  RA Area:     32.90 cm LA Vol (A2C):   99.6 ml  46.45 ml/m RA Volume:   114.00 ml 53.16 ml/m LA Vol (A4C):   112.0 ml 52.23 ml/m LA Biplane Vol: 106.0 ml 49.43 ml/m  AORTIC VALVE AV Area (Vmax):    3.04 cm AV Area (Vmean):   2.88 cm AV Area (VTI):     3.30 cm AV Vmax:           131.09 cm/s AV Vmean:          82.375 cm/s AV VTI:            0.189 m AV Peak Grad:      6.9 mmHg AV Mean Grad:      3.1 mmHg LVOT Vmax:         95.98 cm/s LVOT Vmean:        57.106 cm/s LVOT VTI:          0.150 m LVOT/AV VTI ratio: 0.79  AORTA Ao Root diam: 3.10 cm Ao Asc diam:  3.00 cm MITRAL VALVE                TRICUSPID VALVE MV Area (PHT): 8.37 cm     TR Peak grad:   62.7 mmHg MV Decel Time: 91 msec      TR Vmax:        396.00 cm/s MV E velocity: 125.67 cm/s                             SHUNTS                             Systemic VTI:  0.15 m                              Systemic Diam: 2.30 cm Dina Rich MD Electronically signed by Dina Rich MD Signature Date/Time: 08/29/2020/1:40:18 PM    Final     Catarina Hartshorn, DO  [facility]  If 7PM-7AM, please contact night-coverage [email or web address] Password [email or web address] 08/30/2020, 8:45 AM   LOS: 6 days

## 2020-08-30 NOTE — Progress Notes (Signed)
Responded to nursing call:  hypoxia   Subjective: Came to bedside to evaluate patient and speak with daughter again. Patient remains somnolent, but arouses with protopathic stimuli.   Desaturated into low 80s after ABG-->placed on 6L with sat up to 90-92% Discussed goals of care with daughter at bedside--confirms she wants full scope of care Updated daughter about all labs, ABG and CXR from this am Discussed my concern about having to re-intubate patient given the patient's collapse of left lung and ultimately needing bronchoscopy as pt remains somnolent with weak to no cough.  DTR confirms ok with reintubation.  Vitals:   08/30/20 1000 08/30/20 1015 08/30/20 1030 08/30/20 1045  BP: (!) 170/95 (!) 184/92 (!) 172/102 (!) 108/54  Pulse: 90 85 92 86  Resp: (!) 39 (!) 44 (!) 39 (!) 21  Temp:      TempSrc:      SpO2: 94% 95% 96% 100%  Weight:      Height: 5\' 6"  (1.676 m)      CV--RRR Lung--diminished BS.  Bilateral rales. No wheeze Abd--soft+BS/NT   Assessment/Plan: Acute respiratory failure -due to mucus plug/collapse of left lung -case discussed with critical care--Dr. patient now and transfer to [facility] for bronchoscopy -Daughter updated again and agrees with plan   Total time 35 min     Redge Gainer, DO [facility]

## 2020-08-30 NOTE — ED Provider Notes (Signed)
I was called to intubate [name] around 1030 this morning.  Patient was intubated with a 7-1/2 tube and was given etomidate and sucks.  The intubation was successful on the first attempt and there were no complications   Bethann Berkshire, MD 08/30/20 1053

## 2020-08-30 NOTE — Progress Notes (Signed)
RN called due to patient being more lethargic tonight.  It was noted that she had IV Ativan 2 mg and IV fentanyl 25 mcg yesterday in the morning, at bedside, patient was mildly responsive to sternal rub with slight transitory opening of her eyes.  To ensure no new onset stroke, CT of head was done and showed no acute finding.  ABG was done and showed pH 7.42, PCO2 39.1, PO2 70.6, bicarb 25.7 at FiO2 of 32. No indication for further medication administration at this time, rather than to await patient's altered mental status recovery and continue to monitor patient, this was communicated with daughter at bedside. ELink was consulted for further recommendation and opinion, and it was thought to be probably due to metabolites from the fentanyl or Ativan causing depression of her mental status.  Palliative care will be consulted.

## 2020-08-30 NOTE — Procedures (Signed)
PCCM Video Bronchoscopy Procedure Note  The patient was informed of the risks (including but not limited to bleeding, infection, respiratory failure, lung injury, tooth/oral injury) and benefits of the procedure and gave consent, see chart.  Indication: Mucus plugging left lung  Post Procedure Diagnosis: same  Location: acute respiratory failure with hypoxemia  Condition pre procedure: critically ill, on vent  Medications for procedure: propofol infusion  Procedure description: The bronchoscope was introduced through the endotracheal tube, tracheostomy and passed to the bilateral lungs to the level of the subsegmental bronchi throughout the tracheobronchial tree.  Airway exam revealed that the trachea and right tracheobronchial tree was normal in appearance with the exception of mild inflammation.  There were no airway lesions, no masses noted.  The left mainstem was completely occluded by a thick mucus plug which was suctioned with the bronchoscope.  At the completion of the procedure her left mainstem was clear.  There were no airway lesions seen at the completion of the procedure.  Procedures performed: therapeutic aspiration of secretions from the left lung  Specimens sent: culture left bronch wash  Condition post procedure: critically ill, on vent  EBL: none  Complications: none  Heber Lochearn, MD [place] [facility] Pager: [phone] Cell: [phone] If no response, please call [phone] until 7pm After 7:00 pm call Elink  [phone]

## 2020-08-30 NOTE — Progress Notes (Signed)
ANTICOAGULATION CONSULT NOTE  Pharmacy Consult for Heparin Indication: atrial fibrillation   Labs: Recent Labs    08/28/20 1128 08/29/20 0058 08/29/20 0416 08/29/20 0932 08/29/20 0933 08/29/20 2110 08/30/20 0732 08/30/20 0930 08/30/20 1029  HGB 8.7*  --  8.9*  --   --   --  9.6*  --   --   HCT 28.1*  --  29.5*  --   --   --  31.7*  --   --   PLT 180  --  194  --   --   --  201  --   --   APTT  --    < >  --   --  146* 104*  --   --  57*  HEPARINUNFRC  --   --   --  1.68*  --   --  1.28*  --   --   CREATININE 1.22*  --  1.11*  --   --   --  0.98  --   --   TROPONINIHS  --   --   --   --   --   --   --  293*  --    < > = values in this interval not displayed.   Assessment: Heparin for Afib.  Pt on apixaban PTA.  Will use aPTT to manage Heparin until HL and aPTT correlate.   2/27: 1029: aPTT therapeutic on low end of goal   Goal of Therapy:  Heparin level 0.3-0.7 units/mL APTT 66-102 sec Monitor platelets by anticoagulation protocol: Yes   Plan:  Increase heparin to 900 units/hr to discourage trend down Re-check heparin level and aPTT in am  Valrie Hart, PharmD Clinical Pharmacist

## 2020-08-30 NOTE — Progress Notes (Signed)
Critical troponin 293. Dr. Arbutus Leas notified. No new orders received.

## 2020-08-30 NOTE — Progress Notes (Signed)
eLink Physician-Brief Progress Note Patient Name: [name] DOB: 01-08-1951 MRN: [id number]   Date of Service  08/30/2020  HPI/Events of Note  Multiple liquid stools - Request for Flexiseal.   eICU Interventions  Will order Flexiseal placed.      Intervention Category Major Interventions: Other:  Lenell Antu 08/30/2020, 11:45 PM

## 2020-08-30 NOTE — Progress Notes (Signed)
Update provided for family via phone. Family very appreciative of update. Continuing to monitor patient and will call family with any change.

## 2020-08-30 NOTE — Progress Notes (Signed)
eLink Physician-Brief Progress Note Patient Name: [name] DOB: 04/07/1951 MRN: [id number]   Date of Service  08/30/2020  HPI/Events of Note  Patient more lethargic today post getting Fentanyl 25 mcg IV and Ativan 2 mg IV yesterday morning. Blood glucose = 218. Head CT Scan at 1 AM this morning reveals no acute finding by CT. Tiny white matter infarction in the right posterior frontal region shown by MRI is not resolved. No evidence of new or progressive infarction. ABG on [place] O2 = 7.42/39.1/70.625.7. Pupils are pinpoint per nursing. Given Narcan 0.4 mg IV X 2 without improvement in mental status. Creatinine = 1.11. I suspect that she may have metabolites from the Fentanyl or Ativan that are depressing her mental status. She has a history of seizures, therefore, reluctant to try to reverse Ativan.    eICU Interventions  Plan: 1. Teleneurology consult.      Intervention Category Major Interventions: Change in mental status - evaluation and management  Marquel Pottenger Eugene 08/30/2020, 3:26 AM

## 2020-08-30 NOTE — Progress Notes (Signed)
Messaged Dr. Thomes Dinning at 1933 d/t pt's BP sustaining >200/100 with MAPs >130. At that time. Pt's HR was also sustaining 110-120s. On monitor, pt is afib/flutter with frequent multifocal PVCs with runs of Vtach intermittently. RR in the 40s, and pt was constantly moaning out. Pt had no motor response to any pain stimuli. Dr. Thomes Dinning came to the bedside to assess the patient. In previous night, (2/25 7p-7a), pt was minimally conversing and following commands all shift and complaining of back pain. On that shift, pt received 2 doses of IV Fentanyl once at the beginning of the shift, and the second dose at 0650. It was reported to me that on day shift (2/26) pt became agitated and 2mg  IV Ativan was ordered and given along with Fentanyl at 0943. Pt subsequently became obtunded and Ativan and Fentanyl orders were discontinued. Of note, at home pt is on long term opioid medication for chronic pain as well as PO Ativan TID. Discussed with DO if current status was due to withdrawal? Dr. recommendation at the bedside around 2000 was to continue to assess pt status and vitals and see if the patient woke up more throughout the shift. No new orders for BP control at this time due to known infarct and want for permissive hypertension. Dr. 2001 was messaged again just before 0000 d/t pt still being lethargic but continuously moaning out. Pt status was unchanged from earlier in the shift when DO was at the bedside. BP 207/129 (150), RR still in the 40s. Pt will flicker eyes at most to pain, no motor response to sternal rub, nail bed pressure, or pinching in axillary region. No motor response or vital changes to multiple ABG attempts. Pupils are noted to be slightly smaller (26mm vs 53mm at 2000) and slightly less brisk to light. Positive doll's eyes reflex, negative blink to threat. Dr. 11m ordered ABG and stat head CT. Head CT did not show any changes compared to MRI on 2/25, negative for worsening  infarct, no evidence of hemorrhage or hydrocephalus. ABG as follows- pH 7.428, pCO2- 39.1, pO2- 70.6, Bicar- 25.7. Dr. 3/25 recommendation at this time is to contact Elink.

## 2020-08-31 ENCOUNTER — Inpatient Hospital Stay (HOSPITAL_COMMUNITY): Payer: PPO

## 2020-08-31 DIAGNOSIS — I639 Cerebral infarction, unspecified: Secondary | ICD-10-CM | POA: Diagnosis not present

## 2020-08-31 DIAGNOSIS — G9341 Metabolic encephalopathy: Secondary | ICD-10-CM | POA: Diagnosis not present

## 2020-08-31 DIAGNOSIS — J181 Lobar pneumonia, unspecified organism: Secondary | ICD-10-CM | POA: Diagnosis not present

## 2020-08-31 LAB — CBC
HCT: 27.2 % — ABNORMAL LOW (ref 36.0–46.0)
Hemoglobin: 8.1 g/dL — ABNORMAL LOW (ref 12.0–15.0)
MCH: 32.1 pg (ref 26.0–34.0)
MCHC: 29.8 g/dL — ABNORMAL LOW (ref 30.0–36.0)
MCV: 107.9 fL — ABNORMAL HIGH (ref 80.0–100.0)
Platelets: 155 10*3/uL (ref 150–400)
RBC: 2.52 MIL/uL — ABNORMAL LOW (ref 3.87–5.11)
RDW: 14.8 % (ref 11.5–15.5)
WBC: 18.1 10*3/uL — ABNORMAL HIGH (ref 4.0–10.5)
nRBC: 0.9 % — ABNORMAL HIGH (ref 0.0–0.2)

## 2020-08-31 LAB — GLUCOSE, CAPILLARY
Glucose-Capillary: 186 mg/dL — ABNORMAL HIGH (ref 70–99)
Glucose-Capillary: 150 mg/dL — ABNORMAL HIGH (ref 70–99)
Glucose-Capillary: 159 mg/dL — ABNORMAL HIGH (ref 70–99)
Glucose-Capillary: 165 mg/dL — ABNORMAL HIGH (ref 70–99)
Glucose-Capillary: 203 mg/dL — ABNORMAL HIGH (ref 70–99)
Glucose-Capillary: 250 mg/dL — ABNORMAL HIGH (ref 70–99)

## 2020-08-31 LAB — COMPREHENSIVE METABOLIC PANEL
ALT: 29 U/L (ref 0–44)
AST: 31 U/L (ref 15–41)
Albumin: 2 g/dL — ABNORMAL LOW (ref 3.5–5.0)
Alkaline Phosphatase: 47 U/L (ref 38–126)
Anion gap: 11 (ref 5–15)
BUN: 35 mg/dL — ABNORMAL HIGH (ref 8–23)
CO2: 25 mmol/L (ref 22–32)
Calcium: 9.3 mg/dL (ref 8.9–10.3)
Chloride: 118 mmol/L — ABNORMAL HIGH (ref 98–111)
Creatinine, Ser: 1.16 mg/dL — ABNORMAL HIGH (ref 0.44–1.00)
GFR, Estimated: 50 mL/min — ABNORMAL LOW (ref >60)
Glucose, Bld: 201 mg/dL — ABNORMAL HIGH (ref 70–99)
Potassium: 3 mmol/L — ABNORMAL LOW (ref 3.5–5.1)
Sodium: 154 mmol/L — ABNORMAL HIGH (ref 135–145)
Total Bilirubin: 0.5 mg/dL (ref 0.3–1.2)
Total Protein: 5.1 g/dL — ABNORMAL LOW (ref 6.5–8.1)

## 2020-08-31 LAB — APTT
aPTT: 55 seconds — ABNORMAL HIGH (ref 24–36)
aPTT: 61 seconds — ABNORMAL HIGH (ref 24–36)
aPTT: 53 seconds — ABNORMAL HIGH (ref 24–36)

## 2020-08-31 LAB — BASIC METABOLIC PANEL
Anion gap: 11 (ref 5–15)
BUN: 33 mg/dL — ABNORMAL HIGH (ref 8–23)
CO2: 23 mmol/L (ref 22–32)
Calcium: 9.2 mg/dL (ref 8.9–10.3)
Chloride: 121 mmol/L — ABNORMAL HIGH (ref 98–111)
Creatinine, Ser: 1.25 mg/dL — ABNORMAL HIGH (ref 0.44–1.00)
GFR, Estimated: 46 mL/min — ABNORMAL LOW (ref >60)
Glucose, Bld: 241 mg/dL — ABNORMAL HIGH (ref 70–99)
Potassium: 2.8 mmol/L — ABNORMAL LOW (ref 3.5–5.1)
Sodium: 155 mmol/L — ABNORMAL HIGH (ref 135–145)

## 2020-08-31 LAB — URINE CULTURE: Culture: 80000 — AB

## 2020-08-31 LAB — HEPARIN LEVEL (UNFRACTIONATED): Heparin Unfractionated: 0.66 IU/mL (ref 0.30–0.70)

## 2020-08-31 LAB — TRIGLYCERIDES: Triglycerides: 104 mg/dL (ref <150)

## 2020-08-31 MED ORDER — INSULIN ASPART 100 UNIT/ML ~~LOC~~ SOLN
0.0000 [IU] | SUBCUTANEOUS | Status: DC
  Administered 2020-08-31: 2 [IU] via SUBCUTANEOUS
  Administered 2020-08-31 (×2): 5 [IU] via SUBCUTANEOUS
  Administered 2020-08-31 (×2): 3 [IU] via SUBCUTANEOUS
  Administered 2020-09-01 (×2): 8 [IU] via SUBCUTANEOUS

## 2020-08-31 MED ORDER — POTASSIUM CHLORIDE 10 MEQ/50ML IV SOLN
10.0000 meq | INTRAVENOUS | Status: AC
  Administered 2020-08-31 (×4): 10 meq via INTRAVENOUS
  Filled 2020-08-31 (×4): qty 50

## 2020-08-31 MED ORDER — FREE WATER
100.0000 mL | Status: DC
  Administered 2020-08-31 – 2020-09-01 (×3): 100 mL

## 2020-08-31 MED ORDER — TORSEMIDE 20 MG PO TABS
20.0000 mg | ORAL_TABLET | Freq: Once | ORAL | Status: AC
  Administered 2020-08-31: 20 mg
  Filled 2020-08-31: qty 1

## 2020-08-31 MED ORDER — PROSOURCE TF PO LIQD
45.0000 mL | Freq: Three times a day (TID) | ORAL | Status: DC
  Administered 2020-08-31 – 2020-09-04 (×12): 45 mL
  Filled 2020-08-31 (×12): qty 45

## 2020-08-31 MED ORDER — VITAL AF 1.2 CAL PO LIQD
1000.0000 mL | ORAL | Status: DC
  Administered 2020-08-31 – 2020-09-03 (×6): 1000 mL

## 2020-08-31 MED ORDER — POTASSIUM CHLORIDE 10 MEQ/50ML IV SOLN
10.0000 meq | INTRAVENOUS | Status: AC
Start: 2020-08-31 — End: 2020-09-01
  Administered 2020-08-31 – 2020-09-01 (×4): 10 meq via INTRAVENOUS
  Filled 2020-08-31 (×4): qty 50

## 2020-08-31 MED ORDER — POTASSIUM CHLORIDE 20 MEQ PO PACK
40.0000 meq | PACK | Freq: Once | ORAL | Status: AC
  Administered 2020-08-31: 40 meq
  Filled 2020-08-31: qty 2

## 2020-08-31 MED ORDER — ALBUMIN HUMAN 25 % IV SOLN
50.0000 g | Freq: Once | INTRAVENOUS | Status: AC
  Administered 2020-08-31: 50 g via INTRAVENOUS
  Filled 2020-08-31: qty 200

## 2020-08-31 NOTE — Progress Notes (Signed)
[facility] ADULT ICU REPLACEMENT PROTOCOL   The patient does apply for the [facility] Adult ICU Electrolyte Replacment Protocol based on the criteria listed below:   1. Is GFR >/= 30 ml/min? Yes.    Patient's GFR today is 46 2. Is SCr </= 2? Yes.   Patient's SCr is 1.25 ml/kg/hr 3. Did SCr increase >/= 0.5 in 24 hours? No. 4. Abnormal electrolyte(s): K 2.8 5. Ordered repletion with: protocol 6. If a panic level lab has been reported, has the CCM MD in charge been notified? Yes.  .   Physician:  E. Conni Slipper 08/31/2020 9:18 PM

## 2020-08-31 NOTE — Progress Notes (Signed)
PT Cancellation Note  Patient Details Name: [name] MRN: [id number] DOB: 03-Mar-1951   Cancelled Treatment:    Reason Eval/Treat Not Completed: Patient not medically ready (PT order written post extubation. However, pt with medical decline and reintubation. Will sign off and await new order)   Allenmichael Mcpartlin B Alphonsine Minium 08/31/2020, 6:58 AM  Merryl Hacker, PT Acute Rehabilitation Services Pager: [phone] Office: [phone]

## 2020-08-31 NOTE — Progress Notes (Signed)
SLP Cancellation Note  Patient Details Name: [name] MRN: [id number] DOB: Aug 08, 1950   Cancelled treatment:       Reason Eval/Treat Not Completed: Medical issues which prohibited therapy (Pt is currently intubated. SLP will f/u)  Tara Neu I. Vear Clock, MS, CCC-SLP Acute Rehabilitation Services Office number [phone] Pager [phone]  [name] 08/31/2020, 8:24 AM

## 2020-08-31 NOTE — Progress Notes (Signed)
ANTICOAGULATION CONSULT NOTE  Pharmacy Consult for Heparin Indication: atrial fibrillation   Labs: Recent Labs    08/29/20 0416 08/29/20 0932 08/29/20 0933 08/30/20 0732 08/30/20 0930 08/30/20 1029 08/30/20 1154 08/31/20 0249 08/31/20 1159  HGB 8.9*  --   --  9.6*  --   --   --  8.1*  --   HCT 29.5*  --   --  31.7*  --   --   --  27.2*  --   PLT 194  --   --  201  --   --   --  155  --   APTT  --   --    < >  --   --  57*  --  55* 61*  HEPARINUNFRC  --  1.68*  --  1.28*  --   --   --  0.66  --   CREATININE 1.11*  --   --  0.98  --   --   --  1.16*  --   TROPONINIHS  --   --   --   --  293*  --  260*  --   --    < > = values in this interval not displayed.     Assessment: Heparin for Afib.  Pt took apixaban dose this am, 5mg  at 0841.  Will use aPTT to manage Heparin until HL and aPTT correlate.  Initial aPTT 85 sec.    2/28 update:  APTT low at 61  Goal of Therapy:  Heparin level 0.3-0.7 units/mL APTT 66-102 sec Monitor platelets by anticoagulation protocol: Yes   Plan:  Inc heparin to 1050 units/hr Re-check aPTT in 8 hours  3/28, PharmD, [facility] Clinical Pharmacist Please see AMION for all Pharmacists' Contact Phone Numbers 08/31/2020, 12:41 PM

## 2020-08-31 NOTE — Progress Notes (Signed)
ANTICOAGULATION CONSULT NOTE  Pharmacy Consult for Heparin Indication: atrial fibrillation   Labs: Recent Labs    08/29/20 0416 08/29/20 0932 08/29/20 0933 08/30/20 0732 08/30/20 0930 08/30/20 1029 08/30/20 1154 08/31/20 0249 08/31/20 1159 08/31/20 2020 08/31/20 2102  HGB 8.9*  --   --  9.6*  --   --   --  8.1*  --   --   --   HCT 29.5*  --   --  31.7*  --   --   --  27.2*  --   --   --   PLT 194  --   --  201  --   --   --  155  --   --   --   APTT  --   --    < >  --   --    < >  --  55* 61*  --  53*  HEPARINUNFRC  --  1.68*  --  1.28*  --   --   --  0.66  --   --   --   CREATININE 1.11*  --   --  0.98  --   --   --  1.16*  --  1.25*  --   TROPONINIHS  --   --   --   --  293*  --  260*  --   --   --   --    < > = values in this interval not displayed.     Assessment: Heparin for Afib.  Pt took apixaban dose this am, 5mg  at 0841.  Will use aPTT to manage Heparin until HL and aPTT correlate.  Initial aPTT 85 sec.    Heparin level this evening down to 53 (below goal).  No known issues with IV infusion.  Goal of Therapy:  Heparin level 0.3-0.7 units/mL APTT 66-102 sec Monitor platelets by anticoagulation protocol: Yes   Plan:  Increase IV heparin to 1150 units/hr. Re-check aPTT in 8 hours  , Reece Leader, [facility] Clinical Pharmacist  08/31/2020 10:23 PM   [facility] pharmacy phone numbers are listed on [email or web address]

## 2020-08-31 NOTE — Progress Notes (Signed)
Nutrition Follow-up  DOCUMENTATION CODES:   Obesity unspecified  INTERVENTION:   Initiate tube feeding via OG tube: - Vital AF 1.2 @ 50 ml/hr (1200 ml/day) - ProSource TF 45 ml TID  Tube feeding regimen provides 1560 kcal, 123 grams of protein, and 973 ml of H2O.   Tube feeding regimen and current propofol provides 1726 total kcal (96% of needs).  NUTRITION DIAGNOSIS:   Inadequate oral intake related to inability to eat as evidenced by NPO status.  Ongoing  GOAL:   Patient will meet greater than or equal to 90% of their needs  Met via TF  MONITOR:   PO intake,Vent status,Labs,Weight trends,Skin,TF tolerance  REASON FOR ASSESSMENT:   Consult Enteral/tube feeding initiation and management  ASSESSMENT:   Patient presents with septic shock, pneumonia, acute respiratory failure, acute kidney injury on CKD-3a, DM-2 and obesity. Hx of diastolic heart failure, chronic atrial fibrillation.  2/20 - intubated 2/25 - extubated 2/27 - intubated, transferred to [facility]  Discussed pt with RN and during ICU rounds. Pt had mucus plugging requiring bronchoscopy. Per MD, pt may be extubated within next 24 hours. Consult received for tube feeding intiation and management.  OG tube in place to low intermittent suction.  Admit weight: 104 kg Current weight: 104.7 kg  Patient is currently intubated on ventilator support MV: 6.8 L/min Temp (24hrs), Avg:98.6 F (37 C), Min:97.9 F (36.6 C), Max:99.7 F (37.6 C)  Drips: Propofol: 6.3 ml/hr (provides 166 kcal daily from lipid) Heparin  Medications reviewed and include: colace, SSI q 4 hours, lantus 20 units daily, protonix, miralax, IV abx  Labs reviewed: sodium 154, potassium 3.0, BUN 35, creatinine 1.16, hemoglobin 8.1 CBG's: 150-216 x 24 hours  UOP: 1225 ml x 24 hours I/O's: +1.8 L since admit  NUTRITION - FOCUSED PHYSICAL EXAM:  Flowsheet Row Most Recent Value  Orbital Region No depletion  Upper Arm Region No depletion   Thoracic and Lumbar Region No depletion  Buccal Region Unable to assess  Temple Region No depletion  Clavicle Bone Region No depletion  Clavicle and Acromion Bone Region No depletion  Scapular Bone Region Unable to assess  Dorsal Hand Unable to assess  Patellar Region Mild depletion  Anterior Thigh Region Mild depletion  Posterior Calf Region Mild depletion  Edema (RD Assessment) Mild  Hair Reviewed  Eyes Unable to assess  Mouth Unable to assess  Skin Reviewed  Nails Reviewed       Diet Order:   Diet Order            Diet NPO time specified  Diet effective now                 EDUCATION NEEDS:   Not appropriate for education at this time  Skin:  Skin Assessment: Skin Integrity Issues: DTI: bilateral heels  Last BM:  08/31/20  Height:   Ht Readings from Last 1 Encounters:  08/30/20 '5\' 6"'  (1.676 m)    Weight:   Wt Readings from Last 1 Encounters:  08/31/20 104.7 kg    Ideal Body Weight:  59 kg  BMI:  Body mass index is 37.26 kg/m.  Estimated Nutritional Needs:   Kcal:  4656  Protein:  120-140 grams  Fluid:  >/= 1.5 L    Gustavus Bryant, MS, RD, LDN Inpatient Clinical Dietitian Please see AMiON for contact information.

## 2020-08-31 NOTE — Plan of Care (Signed)
  Problem: Safety: Goal: Non-violent Restraint(s) Outcome: Completed/Met   Problem: Clinical Measurements: Goal: Ability to maintain a body temperature in the normal range will improve Outcome: Not Progressing   Problem: Respiratory: Goal: Ability to maintain adequate ventilation will improve Outcome: Progressing Goal: Ability to maintain a clear airway will improve Outcome: Not Progressing   Problem: Education: Goal: Knowledge of General Education information will improve Description: Including pain rating scale, medication(s)/side effects and non-pharmacologic comfort measures Outcome: Not Progressing   Problem: Clinical Measurements: Goal: Respiratory complications will improve Outcome: Progressing

## 2020-08-31 NOTE — Progress Notes (Signed)
eLink Physician-Brief Progress Note Patient Name: [name] DOB: 09-18-1950 MRN: [id number]   Date of Service  08/31/2020  HPI/Events of Note  Hypokalemia - K+  = 3.0 and Creatinine - 1.16.  eICU Interventions  Will replace K+.      Intervention Category Major Interventions: Electrolyte abnormality - evaluation and management  Sommer,Steven Eugene 08/31/2020, 6:03 AM

## 2020-08-31 NOTE — Progress Notes (Signed)
NAME:  [name], MRN:  [id number], DOB:  Mar 27, 1951, LOS: 7 ADMISSION DATE:  08/23/2020, CONSULTATION DATE:  2/27 REFERRING MD:  Tat, CHIEF COMPLAINT:  Dyspnea   Brief History:  70 year old female admitted to [facility] 2/20 in the setting of septic shock from pneumonia requiring intubation, (extubated 2/25). Transferred to [facility] 2/27 in the setting of worsening mental status, worsening respiratory failure due to a left lung mucous plug.  Past Medical History:  Anemia Barrett's esophagus Systolic heart failure> LVEF 15 to 20% based on recent echocardiogram from [facility]  Diabetes mellitus type 2 Atrial fibrillation Hyperlipidemia Hypertension Seizure disorder Hypothyroidism  Significant Hospital Events:  2/20 Admitted to [facility], intubated 2/22 Stop amiodarone (esr 95) 2/23 Solumedrol trial @ 80 q 12 and reduced to 40 mg q 12h  Pm 2/25  2/25 Extubated 2/26 Worsening mental status/respiratory failure 2/27 Transferred to [facility], reintubated, bronched   Consults:  PCCM  Procedures:  ETT 2/20 >> 2/25, 2/27 >> R IJ CVL 2/21 >>  Significant Diagnostic Tests:   CT Head 2/21 >> mild atrophy, chronic microvascular disease  CT Chest 2/21 >> b/l ASD, small b/l effusions  Micro Data:  2/20 COVID >> negative 2/20 Flu PCR >> negative 2/20 MRSA PCR >> negative 2/22 Resp Cx >> No wbc, few gpc >> abundant e. faecalis 2/22 BC x 2 (while on cefepime) >> 1/2 pos staph epi  2/22 Urine strep >> neg 2/22 Urine legionella >> neg 2/27 Resp Cx >> moderate WBC, predominantly PMN, no organisms >>  Antimicrobials:  Vancomycin 2/20 (single dose), 2/24 (single dose) Cefepime 2/20 >> 2/24 Zosyn 2/24 >>   Interim History / Subjective:  Patient remains intubated, sedated. Daughter, [name], at bedside States that her mother is more interactive today, opening eyes to voice Expresses desire to progress slowly, concerned she "got her out of the hospital too early" last admission  Objective   Blood pressure  107/65, pulse (!) 58, temperature 97.9 F (36.6 C), temperature source Oral, resp. rate 14, height 5' 6" (1.676 m), weight 104.7 kg, SpO2 100 %. CVP:  [3 mmHg] 3 mmHg  Vent Mode: PRVC FiO2 (%):  [60 %-100 %] 60 % Set Rate:  [14 bmp-20 bmp] 14 bmp Vt Set:  [470 mL-510 mL] 470 mL PEEP:  [5 cmH20] 5 cmH20 Plateau Pressure:  [19 cmH20-28 cmH20] 19 cmH20   Intake/Output Summary (Last 24 hours) at 08/31/2020 0830 Last data filed at 08/31/2020 0700 Gross per 24 hour  Intake 1041.69 ml  Output 1325 ml  Net -283.31 ml   Filed Weights   08/27/20 0300 08/30/20 1523 08/31/20 0139  Weight: 106.9 kg 106 kg 104.7 kg    Examination: General: Ill-appearing elderly woman, intubated/sedated, in NAD. HEENT: [place]/AT, anicteric sclera, moist mucous membranes. ETT in place.  Neuro: Mildly sedate on propofol. Opens eyes to verbal/tactile stimuli. Not consistently following commands due to sedation. CV: Irregularly irregular rhythm, rate-controlled in 60s. No m/g/r. PULM: Breathing even and unlabored on vent (PEEP 5, FiO2 60%). Breath sounds diminished bilaterally with scattered rhonchi. GI: Soft, nontender, mildly distended. Normoactive bowel sounds. Extremities: BLE/BUE edema, 1+ symmetric. Bilateral mitts in place. Skin: Warm/dry, small areas of ecchymosis noted to bilateral forearms.  Resolved Hospital Problem list     Assessment & Plan:  Acute respiratory failure with hypoxemia due to left mainstem mucous plug Recent healthcare associated pneumonia S/p bronchoscopy 2/27 with total occlusion of L mainstem with mucous plug, sent for Cx. - Full vent support - Daily WUA/SBT - Pulmonary  toilet with hypertonic saline, guaifenesin - F/u respiratory Cx - Continue Zosyn  Need for sedation for mechanical ventilation - PAD protocol, goal RASS 0 to -1 - Propofol gtt - Fentanyl/Versed PRN  Atrial fibrillation:  - Continue heparin gtt  Septic shock: Shock resolved, needs to complete antibiotics -  Continue Zosyn  Chronic systolic heart failure: - Telemetry monitoring - Torsemide today for diuresis  Chronic kidney disease stage IIIa: - Trend BMP - Monitor I&Os - Replete electrolytes as indicated  Diabetes mellitus type 2: - Monitor CBGs - Lantus, SSI  Need for nutrition: - Dietician consult today - Begin Tfs 2/28  Hypothyroidism: - Continue Synthroid  Anemia of chronic disease, baseline hemoglobin 10 - Monitor for bleeding - Transfuse for Hgb < 7.0  History of seizures: - Continue Keppra  Dementia: - Continue Aricept  Best practice (evaluated daily)  Diet: tube feeding Pain/Anxiety/Delirium protocol (if indicated): as above VAP protocol (if indicated): yes DVT prophylaxis: heparin infusion GI prophylaxis: pantoprazole Glucose control: SSI Mobility: bed rest Disposition: remain in ICU  Goals of Care:  Last date of multidisciplinary goals of care discussion: 2/27 Family and staff present: Dr. Carles Collet, daughter Summary of discussion: full scope of practice Follow up goals of care discussion due: 2/28 > should revisit code status.  Her overall prognosis is poor if she continues to require mechanical ventilation Code Status: Full  Critical care time: 36 minutes   Lestine Mount, PA-C [facility] 08/31/20 8:31 AM  Please see [email or web address] for pager details.

## 2020-08-31 NOTE — Progress Notes (Signed)
eLink Physician-Brief Progress Note Patient Name: [name] DOB: [birth year] MRN: [id number]   Date of Service  08/31/2020  HPI/Events of Note  Notified of Na 155 from 154, K 2.8 Creatinine 1.25 from 1.16 Given torsemide earlier  eICU Interventions  K replaced as per protocol On 30 ml q 4 water flush, will increase to 100 cc q 4 free water flush     Intervention Category Major Interventions: Electrolyte abnormality - evaluation and management  Darl Pikes 08/31/2020, 9:46 PM

## 2020-08-31 NOTE — Progress Notes (Signed)
Attending:    Subjective: Intubated, moved to [facility] yesterday, bronchoscopy yesterday Quiet night Minimal propofol Na up, K down, Hgb down slightly Making urine OK Remains on heparin Some coughing  Objective: Vitals:   08/31/20 0830 08/31/20 0900 08/31/20 0930 08/31/20 1000  BP: (!) 100/53 105/62 99/61 106/62  Pulse: 65 63 63 64  Resp: 14 16 14 14   Temp:      TempSrc:      SpO2: 100% 100% 100% 100%  Weight:      Height:       Vent Mode: PRVC FiO2 (%):  [60 %-100 %] 60 % Set Rate:  [14 bmp-20 bmp] 14 bmp Vt Set:  [470 mL-510 mL] 470 mL PEEP:  [5 cmH20] 5 cmH20 Plateau Pressure:  [19 cmH20-28 cmH20] 19 cmH20  Intake/Output Summary (Last 24 hours) at 08/31/2020 1030 Last data filed at 08/31/2020 0900 Gross per 24 hour  Intake 827.37 ml  Output 1325 ml  Net -497.63 ml    General:  In bed on vent HENT: NCAT ETT in place PULM: diminished on left B, vent supported breathing CV: RRR, no mgr GI: BS+, soft, nontender MSK: normal bulk and tone Neuro: sedated on vent     CBC    Component Value Date/Time   WBC 18.1 (H) 08/31/2020 0249   RBC 2.52 (L) 08/31/2020 0249   HGB 8.1 (L) 08/31/2020 0249   HCT 27.2 (L) 08/31/2020 0249   PLT 155 08/31/2020 0249   MCV 107.9 (H) 08/31/2020 0249   MCH 32.1 08/31/2020 0249   MCHC 29.8 (L) 08/31/2020 0249   RDW 14.8 08/31/2020 0249   LYMPHSABS 2,526 04/08/2020 0810   MONOABS 1.3 (H) 11/16/2019 1103   EOSABS 164 04/08/2020 0810   BASOSABS 57 04/08/2020 0810    BMET    Component Value Date/Time   NA 154 (H) 08/31/2020 0249   K 3.0 (L) 08/31/2020 0249   CL 118 (H) 08/31/2020 0249   CO2 25 08/31/2020 0249   GLUCOSE 201 (H) 08/31/2020 0249   BUN 35 (H) 08/31/2020 0249   CREATININE 1.16 (H) 08/31/2020 0249   CREATININE 1.40 (H) 04/08/2020 0813   CALCIUM 9.3 08/31/2020 0249   CALCIUM 8.4 06/30/2013 0318   GFRNONAA 50 (L) 08/31/2020 0249   GFRNONAA 50 (L) 06/09/2017 1223   GFRAA 50 (L) 11/16/2019 1103   GFRAA 58 (L)  06/09/2017 1223    CXR images none today  Impression/Plan: Acute respiratory failure with hypoxemia, mucus plugging> pressure support trial this morning, CXR now, pulm toilette with hypertonic and guaifenesin HCAP> continue zosyn Hypoklaemia> replete Chronic systolic heart failure> give dose of torsemide today Nutrition> start tube feeding today  Daughter updated bedisde  My cc time 31 minutes  14/01/2017, MD [place] [facility] Pager: [phone] Cell: [phone] After 3pm or if no response, call [phone]

## 2020-08-31 NOTE — Progress Notes (Signed)
ANTICOAGULATION CONSULT NOTE  Pharmacy Consult for Heparin Indication: atrial fibrillation   Labs: Recent Labs    08/28/20 1128 08/29/20 0058 08/29/20 0416 08/29/20 0932 08/29/20 0933 08/29/20 2110 08/30/20 0732 08/30/20 0930 08/30/20 1029 08/30/20 1154 08/31/20 0249  HGB 8.7*  --  8.9*  --   --   --  9.6*  --   --   --  8.1*  HCT 28.1*  --  29.5*  --   --   --  31.7*  --   --   --  27.2*  PLT 180  --  194  --   --   --  201  --   --   --  155  APTT  --    < >  --   --    < > 104*  --   --  57*  --  55*  HEPARINUNFRC  --   --   --  1.68*  --   --  1.28*  --   --   --  0.66  CREATININE 1.22*  --  1.11*  --   --   --  0.98  --   --   --   --   TROPONINIHS  --   --   --   --   --   --   --  293*  --  260*  --    < > = values in this interval not displayed.     Assessment: Heparin for Afib.  Pt took apixaban dose this am, 5mg  at 0841.  Will use aPTT to manage Heparin until HL and aPTT correlate.  Initial aPTT 85 sec.    2/28 AM update:  APTT low  Goal of Therapy:  Heparin level 0.3-0.7 units/mL APTT 66-102 sec Monitor platelets by anticoagulation protocol: Yes   Plan:  Inc heparin to 1000 units/hr Re-check aPTT in 8 hours  3/28, PharmD, BCPS Clinical Pharmacist Phone: [phone]

## 2020-09-01 DIAGNOSIS — I639 Cerebral infarction, unspecified: Secondary | ICD-10-CM | POA: Diagnosis not present

## 2020-09-01 DIAGNOSIS — J181 Lobar pneumonia, unspecified organism: Secondary | ICD-10-CM | POA: Diagnosis not present

## 2020-09-01 DIAGNOSIS — G9341 Metabolic encephalopathy: Secondary | ICD-10-CM | POA: Diagnosis not present

## 2020-09-01 LAB — HEPARIN LEVEL (UNFRACTIONATED): Heparin Unfractionated: 0.45 IU/mL (ref 0.30–0.70)

## 2020-09-01 LAB — GLUCOSE, CAPILLARY
Glucose-Capillary: 248 mg/dL — ABNORMAL HIGH (ref 70–99)
Glucose-Capillary: 258 mg/dL — ABNORMAL HIGH (ref 70–99)
Glucose-Capillary: 274 mg/dL — ABNORMAL HIGH (ref 70–99)
Glucose-Capillary: 289 mg/dL — ABNORMAL HIGH (ref 70–99)
Glucose-Capillary: 296 mg/dL — ABNORMAL HIGH (ref 70–99)
Glucose-Capillary: 314 mg/dL — ABNORMAL HIGH (ref 70–99)

## 2020-09-01 LAB — BASIC METABOLIC PANEL
Anion gap: 12 (ref 5–15)
BUN: 35 mg/dL — ABNORMAL HIGH (ref 8–23)
CO2: 22 mmol/L (ref 22–32)
Calcium: 9.5 mg/dL (ref 8.9–10.3)
Chloride: 121 mmol/L — ABNORMAL HIGH (ref 98–111)
Creatinine, Ser: 1.33 mg/dL — ABNORMAL HIGH (ref 0.44–1.00)
GFR, Estimated: 43 mL/min — ABNORMAL LOW (ref >60)
Glucose, Bld: 313 mg/dL — ABNORMAL HIGH (ref 70–99)
Potassium: 3.2 mmol/L — ABNORMAL LOW (ref 3.5–5.1)
Sodium: 155 mmol/L — ABNORMAL HIGH (ref 135–145)

## 2020-09-01 LAB — CBC
HCT: 25.1 % — ABNORMAL LOW (ref 36.0–46.0)
Hemoglobin: 7.7 g/dL — ABNORMAL LOW (ref 12.0–15.0)
MCH: 33 pg (ref 26.0–34.0)
MCHC: 30.7 g/dL (ref 30.0–36.0)
MCV: 107.7 fL — ABNORMAL HIGH (ref 80.0–100.0)
Platelets: 134 10*3/uL — ABNORMAL LOW (ref 150–400)
RBC: 2.33 MIL/uL — ABNORMAL LOW (ref 3.87–5.11)
RDW: 15.1 % (ref 11.5–15.5)
WBC: 10.7 10*3/uL — ABNORMAL HIGH (ref 4.0–10.5)
nRBC: 1.4 % — ABNORMAL HIGH (ref 0.0–0.2)

## 2020-09-01 LAB — APTT
aPTT: 64 seconds — ABNORMAL HIGH (ref 24–36)
aPTT: 65 seconds — ABNORMAL HIGH (ref 24–36)

## 2020-09-01 LAB — MAGNESIUM: Magnesium: 2.1 mg/dL (ref 1.7–2.4)

## 2020-09-01 LAB — PHOSPHORUS: Phosphorus: 3 mg/dL (ref 2.5–4.6)

## 2020-09-01 MED ORDER — INSULIN ASPART 100 UNIT/ML ~~LOC~~ SOLN
0.0000 [IU] | SUBCUTANEOUS | Status: DC
  Administered 2020-09-01: 7 [IU] via SUBCUTANEOUS
  Administered 2020-09-01: 15 [IU] via SUBCUTANEOUS
  Administered 2020-09-01 (×2): 11 [IU] via SUBCUTANEOUS
  Administered 2020-09-02: 7 [IU] via SUBCUTANEOUS
  Administered 2020-09-02: 4 [IU] via SUBCUTANEOUS
  Administered 2020-09-02 (×3): 7 [IU] via SUBCUTANEOUS
  Administered 2020-09-02 – 2020-09-03 (×2): 11 [IU] via SUBCUTANEOUS
  Administered 2020-09-03 (×2): 7 [IU] via SUBCUTANEOUS
  Administered 2020-09-03 (×2): 11 [IU] via SUBCUTANEOUS
  Administered 2020-09-03 – 2020-09-04 (×2): 7 [IU] via SUBCUTANEOUS
  Administered 2020-09-04: 3 [IU] via SUBCUTANEOUS
  Administered 2020-09-04: 7 [IU] via SUBCUTANEOUS
  Administered 2020-09-04 – 2020-09-05 (×2): 4 [IU] via SUBCUTANEOUS
  Administered 2020-09-06 (×2): 3 [IU] via SUBCUTANEOUS
  Administered 2020-09-06 (×3): 4 [IU] via SUBCUTANEOUS
  Administered 2020-09-06: 3 [IU] via SUBCUTANEOUS
  Administered 2020-09-06: 4 [IU] via SUBCUTANEOUS
  Administered 2020-09-07: 3 [IU] via SUBCUTANEOUS
  Administered 2020-09-07 (×2): 4 [IU] via SUBCUTANEOUS
  Administered 2020-09-08: 7 [IU] via SUBCUTANEOUS
  Administered 2020-09-08: 11 [IU] via SUBCUTANEOUS
  Administered 2020-09-08 (×4): 7 [IU] via SUBCUTANEOUS
  Administered 2020-09-09: 14 [IU] via SUBCUTANEOUS
  Administered 2020-09-09: 7 [IU] via SUBCUTANEOUS
  Administered 2020-09-09: 4 [IU] via SUBCUTANEOUS
  Administered 2020-09-09: 11 [IU] via SUBCUTANEOUS
  Administered 2020-09-09 – 2020-09-10 (×4): 7 [IU] via SUBCUTANEOUS
  Administered 2020-09-10 – 2020-09-11 (×8): 4 [IU] via SUBCUTANEOUS
  Administered 2020-09-11: 7 [IU] via SUBCUTANEOUS
  Administered 2020-09-11: 4 [IU] via SUBCUTANEOUS
  Administered 2020-09-12: 7 [IU] via SUBCUTANEOUS
  Administered 2020-09-12 – 2020-09-13 (×6): 4 [IU] via SUBCUTANEOUS
  Administered 2020-09-13: 7 [IU] via SUBCUTANEOUS
  Administered 2020-09-13: 4 [IU] via SUBCUTANEOUS
  Administered 2020-09-14: 3 [IU] via SUBCUTANEOUS
  Administered 2020-09-14 (×2): 4 [IU] via SUBCUTANEOUS
  Administered 2020-09-14 – 2020-09-15 (×3): 3 [IU] via SUBCUTANEOUS
  Administered 2020-09-15 – 2020-09-16 (×5): 4 [IU] via SUBCUTANEOUS
  Administered 2020-09-16 – 2020-09-17 (×3): 3 [IU] via SUBCUTANEOUS
  Administered 2020-09-17 (×4): 4 [IU] via SUBCUTANEOUS
  Administered 2020-09-17 (×2): 3 [IU] via SUBCUTANEOUS
  Administered 2020-09-18: 4 [IU] via SUBCUTANEOUS
  Administered 2020-09-18: 11 [IU] via SUBCUTANEOUS
  Administered 2020-09-18: 7 [IU] via SUBCUTANEOUS

## 2020-09-01 MED ORDER — INSULIN ASPART 100 UNIT/ML ~~LOC~~ SOLN
4.0000 [IU] | SUBCUTANEOUS | Status: DC
  Administered 2020-09-01: 4 [IU] via SUBCUTANEOUS

## 2020-09-01 MED ORDER — FREE WATER
200.0000 mL | Status: DC
  Administered 2020-09-01 – 2020-09-04 (×18): 200 mL

## 2020-09-01 MED ORDER — CHLORHEXIDINE GLUCONATE CLOTH 2 % EX PADS
6.0000 | MEDICATED_PAD | Freq: Every day | CUTANEOUS | Status: DC
  Administered 2020-09-02 – 2020-09-22 (×19): 6 via TOPICAL

## 2020-09-01 MED ORDER — INSULIN GLARGINE 100 UNIT/ML ~~LOC~~ SOLN
25.0000 [IU] | Freq: Every day | SUBCUTANEOUS | Status: DC
  Administered 2020-09-02 – 2020-09-03 (×2): 25 [IU] via SUBCUTANEOUS
  Filled 2020-09-01 (×2): qty 0.25

## 2020-09-01 MED ORDER — LEVOTHYROXINE SODIUM 25 MCG PO TABS
175.0000 ug | ORAL_TABLET | Freq: Every day | ORAL | Status: DC
  Administered 2020-09-02 – 2020-09-04 (×3): 175 ug
  Filled 2020-09-01 (×3): qty 1

## 2020-09-01 MED ORDER — TORSEMIDE 20 MG PO TABS
20.0000 mg | ORAL_TABLET | Freq: Once | ORAL | Status: AC
  Administered 2020-09-01: 20 mg
  Filled 2020-09-01: qty 1

## 2020-09-01 MED ORDER — INSULIN ASPART 100 UNIT/ML ~~LOC~~ SOLN
5.0000 [IU] | SUBCUTANEOUS | Status: DC
  Administered 2020-09-01 – 2020-09-04 (×17): 5 [IU] via SUBCUTANEOUS

## 2020-09-01 MED ORDER — GUAIFENESIN 100 MG/5ML PO SOLN
5.0000 mL | Freq: Four times a day (QID) | ORAL | Status: DC
  Administered 2020-09-01 – 2020-09-04 (×12): 100 mg
  Filled 2020-09-01 (×12): qty 5

## 2020-09-01 MED ORDER — POTASSIUM CHLORIDE 20 MEQ PO PACK
20.0000 meq | PACK | ORAL | Status: AC
  Administered 2020-09-01 (×2): 20 meq
  Filled 2020-09-01 (×2): qty 1

## 2020-09-01 MED ORDER — POTASSIUM CHLORIDE 10 MEQ/50ML IV SOLN
10.0000 meq | INTRAVENOUS | Status: AC
  Administered 2020-09-01 (×4): 10 meq via INTRAVENOUS
  Filled 2020-09-01 (×4): qty 50

## 2020-09-01 NOTE — Progress Notes (Signed)
ANTICOAGULATION CONSULT NOTE  Pharmacy Consult for Heparin Indication: atrial fibrillation   Labs: Recent Labs    08/30/20 0732 08/30/20 0930 08/30/20 1029 08/30/20 1154 08/31/20 0249 08/31/20 1159 08/31/20 2020 08/31/20 2102 09/01/20 0419 09/01/20 0811 09/01/20 1931  HGB 9.6*  --   --   --  8.1*  --   --   --  7.7*  --   --   HCT 31.7*  --   --   --  27.2*  --   --   --  25.1*  --   --   PLT 201  --   --   --  155  --   --   --  134*  --   --   APTT  --   --    < >  --  55*   < >  --  53*  --  64* 65*  HEPARINUNFRC 1.28*  --   --   --  0.66  --   --   --   --  0.45  --   CREATININE 0.98  --   --   --  1.16*  --  1.25*  --  1.33*  --   --   TROPONINIHS  --  293*  --  260*  --   --   --   --   --   --   --    < > = values in this interval not displayed.     Assessment: 70 yo female presented on 08/23/2020 to [facility] in septic shock requiring intubation. Patient is on apixaban prior to admission for Afib. Pharmacy consulted to dose heparin. Last dose of apixaban inpatient was 2/25 AM.   aPTT remains subtherapeutic (65 sec) on gtt at 1200 units/hr. No issues with line or bleeding reported per RN.  Goal of Therapy:  Heparin level 0.3-0.7 units/mL APTT 66-102 sec Monitor platelets by anticoagulation protocol: Yes   Plan:  Increase heparin to 1300 units/hr Check 8 hr aPTT (a.m. heparin level ok) Watch hgb and ptls closely   Christoper Fabian, PharmD, BCPS Please see amion for complete clinical pharmacist phone list' 09/01/2020, 8:29 PM

## 2020-09-01 NOTE — Progress Notes (Signed)
Pt placed by on full vent support due to increased HR and RR.

## 2020-09-01 NOTE — Progress Notes (Signed)
Inpatient Diabetes Program Recommendations  AACE/ADA: New Consensus Statement on Inpatient Glycemic Control (2015)  Target Ranges:  Prepandial:   less than 140 mg/dL      Peak postprandial:   less than 180 mg/dL (1-2 hours)      Critically ill patients:  140 - 180 mg/dL   Lab Results  Component Value Date   GLUCAP 289 (H) 09/01/2020   HGBA1C 7.9 (H) 08/24/2020    Review of Glycemic Control  Diabetes history: DM 2 Outpatient Diabetes medications: Glipizide 10 mg Daily, Lantus 20-30 units qhs, Jardiance 10 mg Daily, metformin 500 mg bid Current orders for Inpatient glycemic control:  Lantus 20 units Novolog 0-15 units Q4 hours  Vital AF 39ml/hour  Inpatient Diabetes Program Recommendations:    Tube Feeds started yesterday -  Increase Lantus to 25 units -  Consider Novolog 5 units Q4 hours Tube Feed Coverage.  Thanks,  Christena Deem RN, MSN, BC-ADM Inpatient Diabetes Coordinator Team Pager [phone] (8a-5p)

## 2020-09-01 NOTE — Progress Notes (Signed)
[facility] ADULT ICU REPLACEMENT PROTOCOL   The patient does apply for the [facility] Adult ICU Electrolyte Replacment Protocol based on the criteria listed below:   1. Is GFR >/= 30 ml/min? Yes.    Patient's GFR today is 43 2. Is SCr </= 2? Yes.   Patient's SCr is 1.33 ml/kg/hr 3. Did SCr increase >/= 0.5 in 24 hours? No. 4. Abnormal electrolyte(s): K 3.2 5. Ordered repletion with: protocol 6. If a panic level lab has been reported, has the CCM MD in charge been notified? Yes.  .   Physician:  E. Janna Arch Sophiya Morello 09/01/2020 5:31 AM

## 2020-09-01 NOTE — Progress Notes (Signed)
ANTICOAGULATION CONSULT NOTE  Pharmacy Consult for Heparin Indication: atrial fibrillation   Labs: Recent Labs    08/30/20 0732 08/30/20 0930 08/30/20 1029 08/30/20 1154 08/31/20 0249 08/31/20 1159 08/31/20 2020 08/31/20 2102 09/01/20 0419 09/01/20 0811  HGB 9.6*  --   --   --  8.1*  --   --   --  7.7*  --   HCT 31.7*  --   --   --  27.2*  --   --   --  25.1*  --   PLT 201  --   --   --  155  --   --   --  134*  --   APTT  --   --    < >  --  55* 61*  --  53*  --  64*  HEPARINUNFRC 1.28*  --   --   --  0.66  --   --   --   --  0.45  CREATININE 0.98  --   --   --  1.16*  --  1.25*  --  1.33*  --   TROPONINIHS  --  293*  --  260*  --   --   --   --   --   --    < > = values in this interval not displayed.     Assessment: 70 yo female presented on 08/23/2020 to [facility] in septic shock requiring intubation. Patient is on apixaban prior to admission for Afib. Pharmacy consulted to dose heparin. Last dose of apixaban inpatient was 2/25 AM.   Heparin level 0.45 and aPTT 64 - not correlating. APTT is subtherapeutic on 1150 units/hr. Monitoring via aPTT until heparin level and aPTT are correlated due to previous apixaban use. Hgb 7.7. Plt 134. No reported bleeding or issues with IV access or infusion per RN.    Goal of Therapy:  Heparin level 0.3-0.7 units/mL APTT 66-102 sec Monitor platelets by anticoagulation protocol: Yes   Plan:  Increase heparin to 1200 units/hr Check 8 hr aPTT Monitor aPTT, heparin level, CBC and s/s of bleeding daily  Monitor heparin level and aPTT for correlation to change to monitoring by heparin levels Watch hgb and ptls closely.   Gerrit Halls, PharmD Clinical Pharmacist  09/01/2020, 10:59 AM

## 2020-09-01 NOTE — Progress Notes (Signed)
Attending:    Subjective: On pressure support ventilation this morning Not requiring much ventilator support HR improved BP soft, no vasopressor support Remains on low dose propofol Moving around some Diuresed yesterday K dropped yesterday  Remains hypernatremic Hyperglycemic  Objective: Vitals:   09/01/20 0630 09/01/20 0732 09/01/20 0830 09/01/20 0900  BP: (!) 95/55  126/82 (!) 141/83  Pulse: 71  92 99  Resp: 19  (!) 24 19  Temp:  98.2 F (36.8 C)    TempSrc:  Axillary    SpO2: 100%  100% 99%  Weight:      Height:       Vent Mode: PSV FiO2 (%):  [40 %] 40 % Set Rate:  [14 bmp] 14 bmp Vt Set:  [470 mL] 470 mL PEEP:  [5 cmH20] 5 cmH20 Pressure Support:  [8 cmH20] 8 cmH20 Plateau Pressure:  [17 cmH20-22 cmH20] 20 cmH20  Intake/Output Summary (Last 24 hours) at 09/01/2020 1037 Last data filed at 09/01/2020 0600 Gross per 24 hour  Intake 2346.45 ml  Output 2200 ml  Net 146.45 ml    General:  In bed on vent HENT: NCAT ETT in place PULM: CTA B, vent supported breathing CV: RRR, no mgr GI: BS+, soft, nontender MSK: normal bulk and tone Neuro: awake, nods head appropriately     CBC    Component Value Date/Time   WBC 10.7 (H) 09/01/2020 0419   RBC 2.33 (L) 09/01/2020 0419   HGB 7.7 (L) 09/01/2020 0419   HCT 25.1 (L) 09/01/2020 0419   PLT 134 (L) 09/01/2020 0419   MCV 107.7 (H) 09/01/2020 0419   MCH 33.0 09/01/2020 0419   MCHC 30.7 09/01/2020 0419   RDW 15.1 09/01/2020 0419   LYMPHSABS 2,526 04/08/2020 0810   MONOABS 1.3 (H) 11/16/2019 1103   EOSABS 164 04/08/2020 0810   BASOSABS 57 04/08/2020 0810    BMET    Component Value Date/Time   NA 155 (H) 09/01/2020 0419   K 3.2 (L) 09/01/2020 0419   CL 121 (H) 09/01/2020 0419   CO2 22 09/01/2020 0419   GLUCOSE 313 (H) 09/01/2020 0419   BUN 35 (H) 09/01/2020 0419   CREATININE 1.33 (H) 09/01/2020 0419   CREATININE 1.40 (H) 04/08/2020 0813   CALCIUM 9.5 09/01/2020 0419   CALCIUM 8.4 06/30/2013 0318    GFRNONAA 43 (L) 09/01/2020 0419   GFRNONAA 50 (L) 06/09/2017 1223   GFRAA 50 (L) 11/16/2019 1103   GFRAA 58 (L) 06/09/2017 1223    CXR images ett in place consolidation in the left lung, right clear  Impression/Plan: E faecalis pneumonia> end zosyn tomorrow Acute respiratory failure with hypoxemia > continue hypertonic saline, guaifenesin, attempt extubation today Acute systolic heart failure > one dose torsemide today Atrial fibrillation> amiodarone Hypernatremia > increase free water per tube  Slow progress  My cc time 31 minutes  Heber Sultan, MD [place] [facility] Pager: [phone] Cell: [phone] After 3pm or if no response, call [phone]

## 2020-09-01 NOTE — Progress Notes (Signed)
SLP Cancellation Note  Patient Details Name: [name] MRN: [id number] DOB: 1950-08-19   Cancelled treatment:       Reason Eval/Treat Not Completed: Medical issues which prohibited therapy. Pt on vent this am. Will continue to follow.    Mahala Menghini., M.A. CCC-SLP Acute Rehabilitation Services Pager [phone] Office [phone]  09/01/2020, 7:40 AM

## 2020-09-01 NOTE — Progress Notes (Signed)
NAME:  [name], MRN:  [id number], DOB:  May 10, 1951, LOS: 8 ADMISSION DATE:  08/23/2020, CONSULTATION DATE:  2/27 REFERRING MD:  Tat, CHIEF COMPLAINT:  Dyspnea   Brief History:  70 year old female admitted to [facility] 2/20 in the setting of septic shock from pneumonia requiring intubation, (extubated 2/25). Transferred to [facility] 2/27 in the setting of worsening mental status, worsening respiratory failure due to a left lung mucous plug.  Past Medical History:  Anemia Barrett's esophagus Systolic heart failure> LVEF 15 to 20% based on recent echocardiogram from [facility]  Diabetes mellitus type 2 Atrial fibrillation Hyperlipidemia Hypertension Seizure disorder Hypothyroidism  Significant Hospital Events:  2/20 Admitted to [facility], intubated 2/22 Stop amiodarone (esr 95) 2/23 Solumedrol trial @ 80 q 12 and reduced to 40 mg q 12h  Pm 2/25  2/25 Extubated 2/26 Worsening mental status/respiratory failure 2/27 Transferred to [facility], reintubated, bronched 2/28 Decreasing vent settings  Consults:  PCCM  Procedures:  ETT 2/20 >> 2/25, 2/27 >> R IJ CVL 2/21 >>  Significant Diagnostic Tests:   CT Head 2/21 >> mild atrophy, chronic microvascular disease  CT Chest 2/21 >> b/l ASD, small b/l effusions  Micro Data:  2/20 COVID >> negative 2/20 Flu PCR >> negative 2/20 MRSA PCR >> negative 2/22 Resp Cx >> No wbc, few gpc >> abundant e. faecalis 2/22 BC x 2 (while on cefepime) >> 1/2 pos staph epi  2/22 Urine strep >> neg 2/22 Urine legionella >> neg 2/27 Resp Cx >> moderate WBC, predominantly PMN, no organisms >>  Antimicrobials:  Vancomycin 2/20 (single dose), 2/24 (single dose) Cefepime 2/20 >> 2/24 Zosyn 2/24 >>   Interim History / Subjective:  Per patient's daughter at bedside: Moving around more today, doesn't appear uncomfortable Slightly more interactive (though remains on sedation) Seems to be doing well on pressure support  Objective   Blood pressure (!) 95/55, pulse 71,  temperature 97.9 F (36.6 C), temperature source Oral, resp. rate 19, height _0  (1.676 m), weight 103.1 kg, SpO2 100 %.    Vent Mode: PSV FiO2 (%):  [40 %] 40 % Set Rate:  [14 bmp] 14 bmp Vt Set:  [470 mL] 470 mL PEEP:  [5 cmH20] 5 cmH20 Pressure Support:  [8 cmH20] 8 cmH20 Plateau Pressure:  [17 cmH20-22 cmH20] 20 cmH20   Intake/Output Summary (Last 24 hours) at 09/01/2020 0801 Last data filed at 09/01/2020 0600 Gross per 24 hour  Intake 2522.63 ml  Output 2200 ml  Net 322.63 ml   Filed Weights   08/30/20 1523 08/31/20 0139 09/01/20 0357  Weight: 106 kg 104.7 kg 103.1 kg    Examination: General: Ill-appearing elderly woman, intubated/sedated, in NAD. HEENT: [place]/AT, anicteric sclera, moist mucous membranes, ETT in place. Neuro: Sedated on propofol, not following commands. Moves all 4 extremities spontaneously. CV: Irregular rhythm, no m/g/r. PULM: Breathing even and unlabored on vent (PS 8/5), lung fields with improved air movement, mild scattered rhonchi L > R.   GI: Soft, nontender, mildly distended. Normoactive BS. Extremities: BLE/BUE edema, 1+ symmetric. Skin: Warm/dry, ecchymosis noted to bilateral forearms.  Resolved Hospital Problem list     Assessment & Plan:  Acute respiratory failure with hypoxemia due to left mainstem mucous plug Recent healthcare associated pneumonia S/p bronchoscopy 2/27 with total occlusion of L mainstem with mucous plug, sent for Cx. - Pressure support at present, possible extubation today pending adequate mental status - Wean FiO2 for sat > 90% - Daily WUA/SBT - Pulmonary toilet (hypertonic saline, guaifenesin solution) -  F/u respiratory Cx - Continue Zosyn (end 3/2 to complete 7-day course)  Need for sedation for mechanical ventilation - PAD protocol, goal RASS 0 to -1 - Propofol gtt, wean today for possible extubation - Fentanyl/Versed PRN  Atrial fibrillation - Continue heparin gtt  Septic shock: Shock resolved, needs to  complete antibiotics - Continue Zosyn (end 3/2)  Chronic systolic heart failure - Continue telemetry - Monitor volume status - Diuresis needs to be assessed daily, torsemide 20m x 1 today  Chronic kidney disease stage IIIa - Trend BMP - Monitor I&Os - Replete electrolytes PRN for goal K > 4, Mg  > 2  Hypernatremia, likely in the setting of critical illness - Increase FWF to 2019mQ4H  Diabetes mellitus type 2 Hyperglycemia likely 2/2 enteral nutrition - Monitor blood glucoses - Continue Lantus - Add TF coverage Q4H - Increase SSI to resistant scale  Need for enteral nutrition - Appreciate Nutrition consult - Continue TFs  Hypothyroidism - Continue Synthroid  Anemia of chronic disease, baseline hemoglobin 10 - Monitor for signs of active bleeding - Transfuse for Hgb < 7.0  History of seizures - Continue Keppra  Dementia: - Continue Aricept  Best practice (evaluated daily)  Diet: TFs Pain/Anxiety/Delirium protocol (if indicated): As above VAP protocol (if indicated): In place DVT prophylaxis: Heparin gtt GI prophylaxis: PPI Glucose control: SSI Mobility: Bedrest Disposition: ICU  Goals of Care:  Last date of multidisciplinary goals of care discussion: 2/27 Family and staff present: Dr. TaCarles Colletdaughter Summary of discussion: full scope of practice Follow up goals of care discussion due: 3/6 Code Status: Full  Critical care time: 38 minutes   StLestine MountPA-C [place] [facility] 09/01/20 8:01 AM  Please see [email or web address] for pager details.

## 2020-09-02 DIAGNOSIS — G9341 Metabolic encephalopathy: Secondary | ICD-10-CM | POA: Diagnosis not present

## 2020-09-02 DIAGNOSIS — I639 Cerebral infarction, unspecified: Secondary | ICD-10-CM | POA: Diagnosis not present

## 2020-09-02 DIAGNOSIS — J181 Lobar pneumonia, unspecified organism: Secondary | ICD-10-CM | POA: Diagnosis not present

## 2020-09-02 LAB — BASIC METABOLIC PANEL
Anion gap: 8 (ref 5–15)
BUN: 35 mg/dL — ABNORMAL HIGH (ref 8–23)
CO2: 23 mmol/L (ref 22–32)
Calcium: 9 mg/dL (ref 8.9–10.3)
Chloride: 123 mmol/L — ABNORMAL HIGH (ref 98–111)
Creatinine, Ser: 1.19 mg/dL — ABNORMAL HIGH (ref 0.44–1.00)
GFR, Estimated: 49 mL/min — ABNORMAL LOW (ref >60)
Glucose, Bld: 274 mg/dL — ABNORMAL HIGH (ref 70–99)
Potassium: 3.2 mmol/L — ABNORMAL LOW (ref 3.5–5.1)
Sodium: 154 mmol/L — ABNORMAL HIGH (ref 135–145)

## 2020-09-02 LAB — APTT: aPTT: 66 seconds — ABNORMAL HIGH (ref 24–36)

## 2020-09-02 LAB — CBC
HCT: 24.6 % — ABNORMAL LOW (ref 36.0–46.0)
Hemoglobin: 7.1 g/dL — ABNORMAL LOW (ref 12.0–15.0)
MCH: 31.7 pg (ref 26.0–34.0)
MCHC: 28.9 g/dL — ABNORMAL LOW (ref 30.0–36.0)
MCV: 109.8 fL — ABNORMAL HIGH (ref 80.0–100.0)
Platelets: 132 10*3/uL — ABNORMAL LOW (ref 150–400)
RBC: 2.24 MIL/uL — ABNORMAL LOW (ref 3.87–5.11)
RDW: 15.3 % (ref 11.5–15.5)
WBC: 9.7 10*3/uL (ref 4.0–10.5)
nRBC: 2.5 % — ABNORMAL HIGH (ref 0.0–0.2)

## 2020-09-02 LAB — GLUCOSE, CAPILLARY
Glucose-Capillary: 239 mg/dL — ABNORMAL HIGH (ref 70–99)
Glucose-Capillary: 242 mg/dL — ABNORMAL HIGH (ref 70–99)
Glucose-Capillary: 288 mg/dL — ABNORMAL HIGH (ref 70–99)
Glucose-Capillary: 258 mg/dL — ABNORMAL HIGH (ref 70–99)
Glucose-Capillary: 193 mg/dL — ABNORMAL HIGH (ref 70–99)
Glucose-Capillary: 237 mg/dL — ABNORMAL HIGH (ref 70–99)

## 2020-09-02 LAB — HEPARIN LEVEL (UNFRACTIONATED): Heparin Unfractionated: 0.35 IU/mL (ref 0.30–0.70)

## 2020-09-02 MED ORDER — POTASSIUM CHLORIDE 20 MEQ PO PACK
20.0000 meq | PACK | ORAL | Status: AC
  Administered 2020-09-02 (×2): 20 meq
  Filled 2020-09-02 (×2): qty 1

## 2020-09-02 MED ORDER — HEPARIN SODIUM (PORCINE) 5000 UNIT/ML IJ SOLN
5000.0000 [IU] | Freq: Three times a day (TID) | INTRAMUSCULAR | Status: DC
  Administered 2020-09-02 – 2020-09-05 (×9): 5000 [IU] via SUBCUTANEOUS
  Filled 2020-09-02 (×9): qty 1

## 2020-09-02 MED ORDER — POTASSIUM CHLORIDE 10 MEQ/50ML IV SOLN
10.0000 meq | INTRAVENOUS | Status: AC
  Administered 2020-09-02 (×4): 10 meq via INTRAVENOUS
  Filled 2020-09-02 (×4): qty 50

## 2020-09-02 MED ORDER — AMIODARONE HCL 200 MG PO TABS
200.0000 mg | ORAL_TABLET | Freq: Every day | ORAL | Status: DC
  Administered 2020-09-02 – 2020-09-04 (×3): 200 mg
  Filled 2020-09-02 (×3): qty 1

## 2020-09-02 MED ORDER — ASPIRIN 81 MG PO CHEW
81.0000 mg | CHEWABLE_TABLET | Freq: Every day | ORAL | Status: DC
  Administered 2020-09-02 – 2020-09-04 (×3): 81 mg
  Filled 2020-09-02 (×3): qty 1

## 2020-09-02 NOTE — Progress Notes (Signed)
RT note-Placed back to wean at this time, continue to monitor.

## 2020-09-02 NOTE — Progress Notes (Signed)
NAME:  [name], MRN:  [id number], DOB:  1950-10-16, LOS: 9 ADMISSION DATE:  08/23/2020, CONSULTATION DATE:  08/30/2020 REFERRING MD:  Tat, CHIEF COMPLAINT:  Dyspnea   Brief History:  70 year old female admitted to [facility] 2/20 in the setting of septic shock from pneumonia requiring intubation, (extubated 2/25). Transferred to [facility] 2/27 in the setting of worsening mental status, worsening respiratory failure due to a left lung mucous plug.  Past Medical History:  Anemia Barrett's esophagus Systolic heart failure> LVEF 15 to 20% based on recent echocardiogram from [facility]  Diabetes mellitus type 2 Atrial fibrillation Hyperlipidemia Hypertension Seizure disorder Hypothyroidism  Significant Hospital Events:  2/20 Admitted to [facility], intubated 2/22 Stop amiodarone (esr 95) 2/23 Solumedrol trial @ 80 q 12 and reduced to 40 mg q 12h  Pm 2/25  2/25 Extubated 2/26 Worsening mental status/respiratory failure 2/27 Transferred to [facility], reintubated, bronched 2/28 Decreasing vent settings 3/01 Failed vent wean 2/2 increased HR/RR  Consults:  PCCM  Procedures:  ETT 2/20 >> 2/25, 2/27 >> R IJ CVL 2/21 >>  Significant Diagnostic Tests:   CT Head 2/21 >> mild atrophy, chronic microvascular disease  CT Chest 2/21 >> b/l ASD, small b/l effusions  Micro Data:  2/20 COVID >> negative 2/20 Flu PCR >> negative 2/20 MRSA PCR >> negative 2/22 Resp Cx >> No wbc, few gpc >> abundant e. faecalis 2/22 BC x 2 (while on cefepime) >> 1/2 pos staph epi  2/22 Urine strep >> neg 2/22 Urine legionella >> neg 2/27 Resp Cx >> moderate WBC, predominantly PMN, no organisms >>  Antimicrobials:  Vancomycin 2/20 (single dose), 2/24 (single dose) Cefepime 2/20 >> 2/24 Zosyn 2/24 >> 3/2  Interim History / Subjective:  More awake, alert today Daughter at bedside, states patient did well overall yesterday Seemed to get tired with trying to wean the vent yesterday On board for reattempt of weaning  today  Objective   Blood pressure 110/68, pulse 63, temperature 99.7 F (37.6 C), temperature source Axillary, resp. rate 17, height '5\' 6"'  (1.676 m), weight 104.7 kg, SpO2 100 %.    Vent Mode: PRVC FiO2 (%):  [40 %] 40 % Set Rate:  [14 bmp] 14 bmp Vt Set:  [470 mL] 470 mL PEEP:  [5 cmH20] 5 cmH20 Pressure Support:  [8 cmH20] 8 cmH20 Plateau Pressure:  [18 cmH20-23 cmH20] 20 cmH20   Intake/Output Summary (Last 24 hours) at 09/02/2020 0729 Last data filed at 09/02/2020 0600 Gross per 24 hour  Intake 1554.08 ml  Output 2250 ml  Net -695.92 ml   Filed Weights   08/31/20 0139 09/01/20 0357 09/02/20 0500  Weight: 104.7 kg 103.1 kg 104.7 kg    Examination: General: Chronically ill-appearing elderly woman, intubated, in NAD. HEENT: [place]/AT, anicteric sclera, moist mucous membranes, ETT in place. Neuro: Minimally sedated on Propofol, not consistently following commands. Moves all 4 extremities spontaneously. CV: Irregular rhythm, no m/g/r. PULM: Breathing even and unlabored on vent (PS), lung fields with improved air movement, mild scattered rhonchi L > R improving GI: Soft, nontender, nondistended. Normoactive BS. Extremities: BLE/BUE edema, 1+ symmetric. Skin: Warm/dry, ecchymosis noted to bilateral forearms.  Resolved Hospital Problem list     Assessment & Plan:  Acute respiratory failure with hypoxemia due to left mainstem mucous plug Recent healthcare associated pneumonia S/p bronchoscopy 2/27 with total occlusion of L mainstem with mucous plug, sent for Cx. - Attempted pressure support 3/1, failed 2/2 increased HR/RR - Attempting PS wean with possible extubation today, now that  patient is more awake/alert - Wean FiO2 for sat > 90% - Daily WUA/SBT until extubated - Pulmonary toilet (hypertonic saline, guaifenesin solution) - F/u final respiratory Cx - Zosyn to end today, 3/2  Need for sedation for mechanical ventilation - PAD protocol, goal RASS 0 to -1 - Wean sedation for  possible extubation today, 3/2 - Fentanyl/Versed PRN  Atrial fibrillation - Consider stopping heparin gtt in the setting of Hgb drift - Trend H&H  Septic shock: Shock resolved, needs to complete antibiotics - Zosyn to end today, 3/2  Chronic systolic heart failure - Continue telemetry - Monitor volume status - Daily assessment of diuresis needs - Torsemide 96m daily  Chronic kidney disease stage IIIa - Trend BMP - Monitor I&Os - Replete electrolytes as indicated for goal K > 4, Mg > 2  Hypernatremia, likely in the setting of critical illness - Continue FWF 2052mQ4H  Diabetes mellitus type 2 Hyperglycemia likely 2/2 enteral nutrition - Monitor CBGs - Lantus at increased dose (40U) - TF coverage 5U Q4H - SSI resistant scale  Need for enteral nutrition - Appreciate Dietician consult - Continue TFs  Hypothyroidism - Continue Synthroid  Anemia of chronic disease, baseline hemoglobin 10 Slowly drifting Hgb, likely in the setting of critical illness/heparin gtt.  - Monitor for signs of active bleeding - Transfuse for Hgb < 7.0 - Hold heparin gtt  History of seizures - Continue Keppra  Dementia: - Continue Aricept  Best practice (evaluated daily)  Diet: TFs Pain/Anxiety/Delirium protocol (if indicated): As above VAP protocol (if indicated): In place DVT prophylaxis: Heparin gtt GI prophylaxis: PPI Glucose control: SSI Mobility: Bedrest Disposition: ICU  Goals of Care:  Last date of multidisciplinary goals of care discussion: 2/27 Family and staff present: Dr. TaCarles Colletdaughter Summary of discussion: full scope of practice Follow up goals of care discussion due: 3/6 Code Status: Full  Critical care time: 37 minutes   StLestine MountPA-C [place] [facility] 09/02/20 7:29 AM  Please see [email or web address] for pager details.

## 2020-09-02 NOTE — Progress Notes (Signed)
Attending:    Subjective: Weaned some yesterday More awake and alert this morning On minimal propofol No fever HR OK Minimal vent settings Diuresing well with torsemide 20mg  daily  Objective: Vitals:   09/02/20 0630 09/02/20 0700 09/02/20 0745 09/02/20 0746  BP: 131/76 122/78    Pulse: 74 67    Resp: 16 17    Temp:    98.5 F (36.9 C)  TempSrc:    Oral  SpO2: 100% 100% 97%   Weight:      Height:       Vent Mode: PSV;CPAP FiO2 (%):  [40 %] 40 % Set Rate:  [14 bmp] 14 bmp Vt Set:  [470 mL] 470 mL PEEP:  [5 cmH20] 5 cmH20 Pressure Support:  [8 cmH20-10 cmH20] 10 cmH20 Plateau Pressure:  [18 cmH20-23 cmH20] 20 cmH20  Intake/Output Summary (Last 24 hours) at 09/02/2020 0932 Last data filed at 09/02/2020 0800 Gross per 24 hour  Intake 3151.86 ml  Output 2450 ml  Net 701.86 ml    General:  In bed on vent HENT: NCAT ETT in place PULM: CTA B, vent supported breathing CV: RRR, no mgr GI: BS+, soft, nontender MSK: normal bulk and tone Neuro: awake on vent, follows commands    CBC    Component Value Date/Time   WBC 9.7 09/02/2020 0228   RBC 2.24 (L) 09/02/2020 0228   HGB 7.1 (L) 09/02/2020 0228   HCT 24.6 (L) 09/02/2020 0228   PLT 132 (L) 09/02/2020 0228   MCV 109.8 (H) 09/02/2020 0228   MCH 31.7 09/02/2020 0228   MCHC 28.9 (L) 09/02/2020 0228   RDW 15.3 09/02/2020 0228   LYMPHSABS 2,526 04/08/2020 0810   MONOABS 1.3 (H) 11/16/2019 1103   EOSABS 164 04/08/2020 0810   BASOSABS 57 04/08/2020 0810    BMET    Component Value Date/Time   NA 154 (H) 09/02/2020 0228   K 3.2 (L) 09/02/2020 0228   CL 123 (H) 09/02/2020 0228   CO2 23 09/02/2020 0228   GLUCOSE 274 (H) 09/02/2020 0228   BUN 35 (H) 09/02/2020 0228   CREATININE 1.19 (H) 09/02/2020 0228   CREATININE 1.40 (H) 04/08/2020 0813   CALCIUM 9.0 09/02/2020 0228   CALCIUM 8.4 06/30/2013 0318   GFRNONAA 49 (L) 09/02/2020 0228   GFRNONAA 50 (L) 06/09/2017 1223   GFRAA 50 (L) 11/16/2019 1103   GFRAA 58 (L)  06/09/2017 1223      Impression/Plan: Atrial fibrillation> hold heparin given Hgb drift, start ASA, DVT prophylaxis with lovenix, continue amiodarone Hyperglycemia> glargine 40 today, continue SSI Systolic heart failure> torsemide, continue current dosing here Hypokalemia> replete Aspiration pneumonia/e.faecalis pneumonia> complete antibiotics today   My cc time 31 minutes  14/01/2017, MD [place] [facility] Pager: [phone] Cell: [phone] After 3pm or if no response, call [phone]

## 2020-09-02 NOTE — Progress Notes (Addendum)
ANTICOAGULATION CONSULT NOTE  Pharmacy Consult for Heparin Indication: atrial fibrillation   **ADDENDUM: Hold heparin gtt for a. Fib and initiate DVT ppx UFH SQ 5,000 Q8H**  Labs: Recent Labs    08/30/20 0930 08/30/20 1029 08/30/20 1154 08/31/20 0249 08/31/20 1159 08/31/20 2020 08/31/20 2102 09/01/20 0419 09/01/20 0811 09/01/20 1931 09/02/20 0228 09/02/20 0229  HGB  --    < >  --  8.1*  --   --   --  7.7*  --   --  7.1*  --   HCT  --   --   --  27.2*  --   --   --  25.1*  --   --  24.6*  --   PLT  --   --   --  155  --   --   --  134*  --   --  132*  --   APTT  --    < >  --  55*   < >  --    < >  --  64* 65* 66*  --   HEPARINUNFRC  --   --   --  0.66  --   --   --   --  0.45  --   --  0.35  CREATININE  --    < >  --  1.16*  --  1.25*  --  1.33*  --   --  1.19*  --   TROPONINIHS 293*  --  260*  --   --   --   --   --   --   --   --   --    < > = values in this interval not displayed.     Assessment: 70 yo female presented on 08/23/2020 to [facility] in septic shock requiring intubation. Patient is on apixaban prior to admission for Afib. Pharmacy consulted to dose heparin. Last dose of apixaban inpatient was 2/25 AM.   APTT and HL align and are now therapeutic with UFH gtt at 1300 U/h. Hgb has been trending down- 7.1 (3/2). No issues with line or bleeding reported per RN. Decision made to stop gtt and change to DVT ppx dosing.   Goal of Therapy:  Heparin level 0.3-0.7 units/mL Monitor platelets by anticoagulation protocol: Yes   Plan:  STOP heparin gtt 1300 units/hr START heparin SQ 5,000 U Q8H for DVT ppx  Watch hgb and plts closely   Jani Gravel, PharmD-Candidate  09/02/2020, 8:59 AM

## 2020-09-02 NOTE — Progress Notes (Signed)
[facility] ADULT ICU REPLACEMENT PROTOCOL   The patient does apply for the [facility] Adult ICU Electrolyte Replacment Protocol based on the criteria listed below:   1. Is GFR >/= 30 ml/min? Yes.    Patient's GFR today is 49 2. Is SCr </= 2? Yes.   Patient's SCr is 1.19 ml/kg/hr 3. Did SCr increase >/= 0.5 in 24 hours? No. 4. Abnormal electrolyte(s): K 3.2 5. Ordered repletion with: protocol 6. If a panic level lab has been reported, has the CCM MD in charge been notified? Yes.  .   Physician:  S. Bobbye Morton R Ilyaas Musto 09/02/2020 5:44 AM

## 2020-09-02 NOTE — Progress Notes (Signed)
RT note- Placed back to full support on ventilator for the night.

## 2020-09-02 NOTE — Progress Notes (Signed)
SLP Cancellation Note  Patient Details Name: [name] MRN: [id number] DOB: 20-Jun-1951   Cancelled treatment:       Reason Eval/Treat Not Completed: Patient not medically ready. Pt remains on vent this am, but has been working on weaning. Will continue to follow.     Mahala Menghini., M.A. CCC-SLP Acute Rehabilitation Services Pager [phone] Office [phone]  09/02/2020, 7:37 AM

## 2020-09-03 ENCOUNTER — Inpatient Hospital Stay (HOSPITAL_COMMUNITY): Payer: PPO

## 2020-09-03 DIAGNOSIS — J181 Lobar pneumonia, unspecified organism: Secondary | ICD-10-CM | POA: Diagnosis not present

## 2020-09-03 DIAGNOSIS — I639 Cerebral infarction, unspecified: Secondary | ICD-10-CM | POA: Diagnosis not present

## 2020-09-03 DIAGNOSIS — G9341 Metabolic encephalopathy: Secondary | ICD-10-CM | POA: Diagnosis not present

## 2020-09-03 LAB — BASIC METABOLIC PANEL
Anion gap: 7 (ref 5–15)
BUN: 28 mg/dL — ABNORMAL HIGH (ref 8–23)
CO2: 23 mmol/L (ref 22–32)
Calcium: 9.1 mg/dL (ref 8.9–10.3)
Chloride: 120 mmol/L — ABNORMAL HIGH (ref 98–111)
Creatinine, Ser: 1.04 mg/dL — ABNORMAL HIGH (ref 0.44–1.00)
GFR, Estimated: 57 mL/min — ABNORMAL LOW (ref >60)
Glucose, Bld: 254 mg/dL — ABNORMAL HIGH (ref 70–99)
Potassium: 3.5 mmol/L (ref 3.5–5.1)
Sodium: 150 mmol/L — ABNORMAL HIGH (ref 135–145)

## 2020-09-03 LAB — CULTURE, RESPIRATORY W GRAM STAIN

## 2020-09-03 LAB — CBC
HCT: 22.7 % — ABNORMAL LOW (ref 36.0–46.0)
Hemoglobin: 7 g/dL — ABNORMAL LOW (ref 12.0–15.0)
MCH: 33.5 pg (ref 26.0–34.0)
MCHC: 30.8 g/dL (ref 30.0–36.0)
MCV: 108.6 fL — ABNORMAL HIGH (ref 80.0–100.0)
Platelets: 136 10*3/uL — ABNORMAL LOW (ref 150–400)
RBC: 2.09 MIL/uL — ABNORMAL LOW (ref 3.87–5.11)
RDW: 15.9 % — ABNORMAL HIGH (ref 11.5–15.5)
WBC: 9.9 10*3/uL (ref 4.0–10.5)
nRBC: 1.4 % — ABNORMAL HIGH (ref 0.0–0.2)

## 2020-09-03 LAB — GLUCOSE, CAPILLARY
Glucose-Capillary: 252 mg/dL — ABNORMAL HIGH (ref 70–99)
Glucose-Capillary: 229 mg/dL — ABNORMAL HIGH (ref 70–99)
Glucose-Capillary: 253 mg/dL — ABNORMAL HIGH (ref 70–99)
Glucose-Capillary: 247 mg/dL — ABNORMAL HIGH (ref 70–99)
Glucose-Capillary: 237 mg/dL — ABNORMAL HIGH (ref 70–99)
Glucose-Capillary: 279 mg/dL — ABNORMAL HIGH (ref 70–99)

## 2020-09-03 LAB — APTT: aPTT: 28 seconds (ref 24–36)

## 2020-09-03 LAB — HEPARIN LEVEL (UNFRACTIONATED): Heparin Unfractionated: <0.1 IU/mL — ABNORMAL LOW (ref 0.30–0.70)

## 2020-09-03 MED ORDER — INSULIN GLARGINE 100 UNIT/ML ~~LOC~~ SOLN
25.0000 [IU] | Freq: Once | SUBCUTANEOUS | Status: AC
  Administered 2020-09-03: 25 [IU] via SUBCUTANEOUS
  Filled 2020-09-03: qty 0.25

## 2020-09-03 MED ORDER — FUROSEMIDE 10 MG/ML IJ SOLN
40.0000 mg | Freq: Once | INTRAMUSCULAR | Status: AC
  Administered 2020-09-03: 40 mg via INTRAVENOUS
  Filled 2020-09-03: qty 4

## 2020-09-03 MED ORDER — LABETALOL HCL 5 MG/ML IV SOLN
10.0000 mg | INTRAVENOUS | Status: DC | PRN
  Administered 2020-09-03 – 2020-09-04 (×2): 10 mg via INTRAVENOUS
  Filled 2020-09-03 (×3): qty 4

## 2020-09-03 MED ORDER — POTASSIUM CHLORIDE 20 MEQ PO PACK
40.0000 meq | PACK | Freq: Once | ORAL | Status: AC
  Administered 2020-09-03: 40 meq
  Filled 2020-09-03: qty 2

## 2020-09-03 MED ORDER — METOPROLOL TARTRATE 25 MG/10 ML ORAL SUSPENSION
12.5000 mg | Freq: Two times a day (BID) | ORAL | Status: DC
  Administered 2020-09-03 – 2020-09-04 (×3): 12.5 mg
  Filled 2020-09-03 (×3): qty 10

## 2020-09-03 MED ORDER — SODIUM CHLORIDE 0.9 % IV SOLN
INTRAVENOUS | Status: DC | PRN
  Administered 2020-09-03 – 2020-09-05 (×2): 500 mL via INTRAVENOUS

## 2020-09-03 MED ORDER — LEVETIRACETAM 100 MG/ML PO SOLN
1500.0000 mg | Freq: Two times a day (BID) | ORAL | Status: DC
  Administered 2020-09-03 – 2020-09-04 (×2): 1500 mg
  Filled 2020-09-03 (×2): qty 15

## 2020-09-03 MED ORDER — SODIUM CHLORIDE 0.9% FLUSH
10.0000 mL | Freq: Two times a day (BID) | INTRAVENOUS | Status: DC
  Administered 2020-09-03 (×2): 10 mL
  Administered 2020-09-04: 20 mL
  Administered 2020-09-04 – 2020-09-07 (×5): 10 mL
  Administered 2020-09-08: 3 mL
  Administered 2020-09-08: 10 mL

## 2020-09-03 MED ORDER — SODIUM CHLORIDE 0.9% FLUSH: 10.0000 mL | INTRAVENOUS | Status: DC | PRN

## 2020-09-03 MED ORDER — INSULIN GLARGINE 100 UNIT/ML ~~LOC~~ SOLN
50.0000 [IU] | Freq: Every day | SUBCUTANEOUS | Status: DC
  Filled 2020-09-03: qty 0.5

## 2020-09-03 NOTE — Progress Notes (Signed)
RT note- Patient placed back to full support due to increased RR-40's

## 2020-09-03 NOTE — Progress Notes (Signed)
[facility] ADULT ICU REPLACEMENT PROTOCOL   The patient does apply for the [facility] Adult ICU Electrolyte Replacment Protocol based on the criteria listed below:   1. Is GFR >/= 30 ml/min? Yes.    Patient's GFR today is 57 2. Is SCr </= 2? Yes.   Patient's SCr is 1.04 ml/kg/hr 3. Did SCr increase >/= 0.5 in 24 hours? No. 4. Abnormal electrolyte(s): K 3.5 5. Ordered repletion with: protocol 6. If a panic level lab has been reported, has the CCM MD in charge been notified? Yes.  .   Physician:  S. Bobbye Morton R Ruhl 09/03/2020 4:33 AM

## 2020-09-03 NOTE — Progress Notes (Signed)
Attending:    Subjective: Failed SBT this morning Some diuresis  Objective: Vitals:   09/03/20 0800 09/03/20 0900 09/03/20 1000 09/03/20 1132  BP: (!) 159/94 (!) 157/106 (!) 119/53   Pulse: (!) 104 (!) 119 73   Resp: (!) 31 (!) 27 16   Temp:    99.2 F (37.3 C)  TempSrc:    Axillary  SpO2: 100% 97% 100%   Weight:      Height:       Vent Mode: PRVC FiO2 (%):  [40 %] 40 % Set Rate:  [14 bmp] 14 bmp Vt Set:  [470 mL] 470 mL PEEP:  [5 cmH20] 5 cmH20 Pressure Support:  [10 cmH20] 10 cmH20 Plateau Pressure:  [19 cmH20-24 cmH20] 19 cmH20  Intake/Output Summary (Last 24 hours) at 09/03/2020 1136 Last data filed at 09/03/2020 1000 Gross per 24 hour  Intake 3205.56 ml  Output 1150 ml  Net 2055.56 ml    General:  In bed on vent HENT: NCAT ETT in place PULM: Crackles bases B, vent supported breathing CV: RRR, no mgr GI: BS+, soft, nontender MSK: normal bulk and tone Neuro: drowsy, will open eyes to voice   CBC    Component Value Date/Time   WBC 9.9 09/03/2020 0635   RBC 2.09 (L) 09/03/2020 0635   HGB 7.0 (L) 09/03/2020 0635   HCT 22.7 (L) 09/03/2020 0635   PLT 136 (L) 09/03/2020 0635   MCV 108.6 (H) 09/03/2020 0635   MCH 33.5 09/03/2020 0635   MCHC 30.8 09/03/2020 0635   RDW 15.9 (H) 09/03/2020 0635   LYMPHSABS 2,526 04/08/2020 0810   MONOABS 1.3 (H) 11/16/2019 1103   EOSABS 164 04/08/2020 0810   BASOSABS 57 04/08/2020 0810    BMET    Component Value Date/Time   NA 150 (H) 09/03/2020 0308   K 3.5 09/03/2020 0308   CL 120 (H) 09/03/2020 0308   CO2 23 09/03/2020 0308   GLUCOSE 254 (H) 09/03/2020 0308   BUN 28 (H) 09/03/2020 0308   CREATININE 1.04 (H) 09/03/2020 0308   CREATININE 1.40 (H) 04/08/2020 0813   CALCIUM 9.1 09/03/2020 0308   CALCIUM 8.4 06/30/2013 0318   GFRNONAA 57 (L) 09/03/2020 0308   GFRNONAA 50 (L) 06/09/2017 1223   GFRAA 50 (L) 11/16/2019 1103   GFRAA 58 (L) 06/09/2017 1223    CXR images reviewed, trace interstitial edema, sub segmental  atelectasis in left lower lobe  Impression/Plan: Acute respiratory failure with hypoxemia due to generalized weakness, morbid obesity, recovery from pneumonia and complicated by severe systolic heart failure: She failed spontaneous breathing trial this morning, continues to be unable to wean from the ventilator.  Overall prognosis seems poor.  I had a lengthy conversation with the patient's daughter about this this morning, will consult palliative care.  The patient had a lengthy hospital stay which included long-term acute care hospitalization and prolonged mechanical ventilation with tracheostomy several years ago so the daughter is seeing things through a lens with this in mind.  I have explained that her condition is worse now as she has systolic heart failure.  Hopefully palliative care can help [place] with this.  We will continue full mechanical ventilatory support, pulmonary toilet with hypertonic saline, guaifenesin, and chest PT.  Systolic heart failure: Diuresis today with Lasix  Atrial fibrillation: Given recent anemia, will hold full anticoagulation, use aspirin for stroke prevention, Lovenox for DVT prevention at prophylactic rather than full anticoagulation doses.  Rest per Carlis Abbott note.  My cc time  35 minutes  Heber Animas, MD [place] [facility] Pager: [phone] Cell: [phone] After 3pm or if no response, call [phone]

## 2020-09-03 NOTE — Progress Notes (Signed)
SLP Cancellation Note  Patient Details Name: [name] MRN: [id number] DOB: 1951/04/30   Cancelled treatment:       Reason Eval/Treat Not Completed: Patient not medically ready (Pt remains on the vent at this time. SLP will continue to follow to readiness.)  Assata Juncaj I. Vear Clock, MS, CCC-SLP Acute Rehabilitation Services Office number [phone] Pager [phone]  [name] 09/03/2020, 3:48 PM

## 2020-09-03 NOTE — Progress Notes (Signed)
NAME:  [name], MRN:  [id number], DOB:  02-18-1951, LOS: 9 ADMISSION DATE:  08/23/2020, CONSULTATION DATE:  08/30/2020 REFERRING MD:  Tat, CHIEF COMPLAINT:  Dyspnea   Brief History:  70 year old female admitted to [facility] 2/20 in the setting of septic shock from pneumonia requiring intubation, (extubated 2/25). Transferred to [facility] 2/27 in the setting of worsening mental status, worsening respiratory failure due to a left lung mucous plug.  Past Medical History:  Anemia Barrett's esophagus Systolic heart failure> LVEF 15 to 20% based on recent echocardiogram from [facility]  Diabetes mellitus type 2 Atrial fibrillation Hyperlipidemia Hypertension Seizure disorder Hypothyroidism  Significant Hospital Events:  2/20 Admitted to [facility], intubated 2/22 Stop amiodarone (esr 95) 2/23 Solumedrol trial @ 80 q 12 and reduced to 40 mg q 12h  Pm 2/25  2/25 Extubated 2/26 Worsening mental status/respiratory failure 2/27 Transferred to [facility], reintubated, bronched 2/28 Decreasing vent settings 3/01 Failed vent wean 2/2 increased HR/RR  Consults:  PCCM  Procedures:  ETT 2/20 >> 2/25, 2/27 >> R IJ CVL 2/21 >>  Significant Diagnostic Tests:   CT Head 2/21 >> mild atrophy, chronic microvascular disease  CT Chest 2/21 >> b/l ASD, small b/l effusions  Micro Data:  2/20 COVID >> negative 2/20 Flu PCR >> negative 2/20 MRSA PCR >> negative 2/22 Resp Cx >> No wbc, few gpc >> abundant e. faecalis 2/22 BC x 2 (while on cefepime) >> 1/2 pos staph epi  2/22 Urine strep >> neg 2/22 Urine legionella >> neg 2/27 Resp Cx >> moderate WBC, predominantly PMN, no organisms >>  Antimicrobials:  Vancomycin 2/20 (single dose), 2/24 (single dose) Cefepime 2/20 >> 2/24 Zosyn 2/24 >> 3/2  Interim History / Subjective:  Attempted SBT again today, pt did well for a couple of hours and then had rapid, shallow breathing so was placed back on full vent support   Objective   Blood pressure (!) 159/86, pulse 91,  temperature 99.2 F (37.3 C), temperature source Axillary, resp. rate 16, height 5' 6" (1.676 m), weight 105.6 kg, SpO2 100 %.    Vent Mode: PRVC FiO2 (%):  [40 %] 40 % Set Rate:  [14 bmp] 14 bmp Vt Set:  [470 mL] 470 mL PEEP:  [5 cmH20] 5 cmH20 Pressure Support:  [10 cmH20] 10 cmH20 Plateau Pressure:  [19 cmH20-24 cmH20] 19 cmH20   Intake/Output Summary (Last 24 hours) at 09/03/2020 1016 Last data filed at 09/03/2020 0800 Gross per 24 hour  Intake 2965.57 ml  Output 1150 ml  Net 1815.57 ml   Filed Weights   09/01/20 0357 09/02/20 0500 09/03/20 0500  Weight: 103.1 kg 104.7 kg 105.6 kg    General:  Elderly, critically ill F intubated, not in obvious distress HEENT: MM pink/moist, ETT in place, sclera anicteric Neuro: off sedation, turns head to voice, triggering breaths, not otherwise following commands CV: s1s2 rrr, no m/r/g PULM:  Course throughout, adequate O2 saturations without distress on PEEP 5 GI: soft, bsx4 active  Extremities: warm/dry, 1+ edema  Skin: no rashes or lesions   Resolved Hospital Problem list     Assessment & Plan:  Acute respiratory failure with hypoxemia due to left mainstem mucous plug Recent healthcare associated pneumonia S/p bronchoscopy 2/27 with total occlusion of L mainstem with mucous plug, sent for Cx. P: -Difficulty tolerating SBT for the last two days, placed back on full support -CXR today with continued edema and +4L, on [place] at home -Lasix 9m IV this morning and monitor response, trial  SBT again this afternoon if diureses well and is awake enough - Daily WUA/SBT until extubated - Pulmonary toilet (hypertonic saline, guaifenesin solution) - F/u final respiratory Cx - Finished course of Zosyn 3/2 -Pt has already been through trach/LTACH, likely poor prognosis to recover meaningfully if unable to wean from ventilator support again, daughter wanting full code/aggressive care, consult palliative for assistance with GOC  Need for  sedation for mechanical ventilation - PAD protocol, goal RASS 0 to -1 - Off sedation 3/3 - Fentanyl/Versed PRN  Atrial fibrillation -continue heparin gtt -continue amiodarone   Anemia, likely of critical illness Baseline on admission 10, 7.0 today P: -type and screen -Hgb drift since admission without evidence of acute bleeding -trend h/h, transfuse for Hgb <7  Septic shock: Shock resolved, needs to complete antibiotics -course of Zosyn completed 3/2  Chronic systolic heart failure Echo 2/26 with EF of 50-55% P: -Lasix 18m today -monitor UOP -weight 105.6kg, 104kg on admission  Chronic kidney disease stage IIIa - Trend BMP - Monitor I&Os - Replete electrolytes as indicated for goal K > 4, Mg > 2  Hypernatremia, likely in the setting of critical illness - Continue FWF 2066mQ4H  Diabetes mellitus type 2 Hyperglycemia likely 2/2 enteral nutrition - Monitor CBGs - Lantus increased to 50units - TF coverage 5U Q4H - SSI resistant scale  Need for enteral nutrition - Appreciate Dietician consult - Continue TFs  Hypothyroidism - Continue Synthroid  History of seizures - Continue Keppra  Dementia: - Continue Aricept  Best practice (evaluated daily)  Diet: TFs Pain/Anxiety/Delirium protocol (if indicated): As above VAP protocol (if indicated): In place DVT prophylaxis: Heparin gtt GI prophylaxis: PPI Glucose control: SSI Mobility: Bedrest Disposition: ICU  Goals of Care:  Last date of multidisciplinary goals of care discussion: 2/27 Family and staff present: Dr. TaCarles Colletdaughter Summary of discussion: full scope of practice Follow up goals of care discussion due: 3/6 Code Status: Full  Critical care time: 40 minutes     CRITICAL CARE Performed by: LaOtilio Carpenleason   Total critical care time: 40 minutes  Critical care time was exclusive of separately billable procedures and treating other patients.  Critical care was necessary to treat or prevent  imminent or life-threatening deterioration.  Critical care was time spent personally by me on the following activities: development of treatment plan with patient and/or surrogate as well as nursing, discussions with consultants, evaluation of patient's response to treatment, examination of patient, obtaining history from patient or surrogate, ordering and performing treatments and interventions, ordering and review of laboratory studies, ordering and review of radiographic studies, pulse oximetry and re-evaluation of patient's condition.   LaOtilio Carpenleason, PA-C [place] [facility] See Amion for pager If no response to pager , please call [phone] 7pm After 7:00 pm call Elink  [phone]

## 2020-09-04 DIAGNOSIS — J9601 Acute respiratory failure with hypoxia: Secondary | ICD-10-CM | POA: Diagnosis not present

## 2020-09-04 DIAGNOSIS — G9341 Metabolic encephalopathy: Secondary | ICD-10-CM | POA: Diagnosis not present

## 2020-09-04 LAB — BASIC METABOLIC PANEL
Anion gap: 9 (ref 5–15)
BUN: 30 mg/dL — ABNORMAL HIGH (ref 8–23)
CO2: 23 mmol/L (ref 22–32)
Calcium: 9.5 mg/dL (ref 8.9–10.3)
Chloride: 115 mmol/L — ABNORMAL HIGH (ref 98–111)
Creatinine, Ser: 1 mg/dL (ref 0.44–1.00)
GFR, Estimated: >60 mL/min (ref >60)
Glucose, Bld: 269 mg/dL — ABNORMAL HIGH (ref 70–99)
Potassium: 3.3 mmol/L — ABNORMAL LOW (ref 3.5–5.1)
Sodium: 147 mmol/L — ABNORMAL HIGH (ref 135–145)

## 2020-09-04 LAB — CBC
HCT: 26.2 % — ABNORMAL LOW (ref 36.0–46.0)
Hemoglobin: 7.7 g/dL — ABNORMAL LOW (ref 12.0–15.0)
MCH: 32.1 pg (ref 26.0–34.0)
MCHC: 29.4 g/dL — ABNORMAL LOW (ref 30.0–36.0)
MCV: 109.2 fL — ABNORMAL HIGH (ref 80.0–100.0)
Platelets: 153 10*3/uL (ref 150–400)
RBC: 2.4 MIL/uL — ABNORMAL LOW (ref 3.87–5.11)
RDW: 16 % — ABNORMAL HIGH (ref 11.5–15.5)
WBC: 11.1 10*3/uL — ABNORMAL HIGH (ref 4.0–10.5)
nRBC: 1.8 % — ABNORMAL HIGH (ref 0.0–0.2)

## 2020-09-04 LAB — GLUCOSE, CAPILLARY
Glucose-Capillary: 249 mg/dL — ABNORMAL HIGH (ref 70–99)
Glucose-Capillary: 235 mg/dL — ABNORMAL HIGH (ref 70–99)
Glucose-Capillary: 194 mg/dL — ABNORMAL HIGH (ref 70–99)
Glucose-Capillary: 196 mg/dL — ABNORMAL HIGH (ref 70–99)
Glucose-Capillary: 142 mg/dL — ABNORMAL HIGH (ref 70–99)
Glucose-Capillary: 95 mg/dL (ref 70–99)

## 2020-09-04 MED ORDER — ORAL CARE MOUTH RINSE
15.0000 mL | Freq: Two times a day (BID) | OROMUCOSAL | Status: DC
  Administered 2020-09-04: 15 mL via OROMUCOSAL

## 2020-09-04 MED ORDER — LEVETIRACETAM IN NACL 1500 MG/100ML IV SOLN
1500.0000 mg | Freq: Two times a day (BID) | INTRAVENOUS | Status: DC
  Administered 2020-09-04 – 2020-09-05 (×2): 1500 mg via INTRAVENOUS
  Filled 2020-09-04 (×2): qty 100

## 2020-09-04 MED ORDER — FUROSEMIDE 10 MG/ML IJ SOLN
40.0000 mg | Freq: Every day | INTRAMUSCULAR | Status: DC
  Administered 2020-09-04 – 2020-09-06 (×3): 40 mg via INTRAVENOUS
  Filled 2020-09-04 (×3): qty 4

## 2020-09-04 MED ORDER — POTASSIUM CHLORIDE 10 MEQ/50ML IV SOLN
10.0000 meq | INTRAVENOUS | Status: AC
  Administered 2020-09-04 (×4): 10 meq via INTRAVENOUS
  Filled 2020-09-04 (×4): qty 50

## 2020-09-04 MED ORDER — POTASSIUM CHLORIDE 20 MEQ PO PACK: 20.0000 meq | PACK | Freq: Every day | ORAL | Status: DC

## 2020-09-04 MED ORDER — INSULIN GLARGINE 100 UNIT/ML ~~LOC~~ SOLN
70.0000 [IU] | Freq: Every day | SUBCUTANEOUS | Status: DC
  Administered 2020-09-04: 70 [IU] via SUBCUTANEOUS
  Filled 2020-09-04: qty 0.7

## 2020-09-04 MED ORDER — INSULIN GLARGINE 100 UNIT/ML ~~LOC~~ SOLN
35.0000 [IU] | Freq: Two times a day (BID) | SUBCUTANEOUS | Status: DC
  Filled 2020-09-04 (×2): qty 0.35

## 2020-09-04 MED ORDER — METOPROLOL TARTRATE 5 MG/5ML IV SOLN
2.5000 mg | Freq: Four times a day (QID) | INTRAVENOUS | Status: DC
  Administered 2020-09-04 – 2020-09-07 (×11): 2.5 mg via INTRAVENOUS
  Filled 2020-09-04 (×11): qty 5

## 2020-09-04 MED ORDER — POTASSIUM CHLORIDE 10 MEQ/50ML IV SOLN
10.0000 meq | INTRAVENOUS | Status: AC
  Administered 2020-09-04 (×2): 10 meq via INTRAVENOUS
  Filled 2020-09-04 (×2): qty 50

## 2020-09-04 MED ORDER — KETOROLAC TROMETHAMINE 15 MG/ML IJ SOLN
15.0000 mg | Freq: Three times a day (TID) | INTRAMUSCULAR | Status: AC | PRN
  Administered 2020-09-04 – 2020-09-08 (×7): 15 mg via INTRAVENOUS
  Filled 2020-09-04 (×9): qty 1

## 2020-09-04 NOTE — Progress Notes (Addendum)
NAME:  [name], MRN:  [id number], DOB:  1951-03-09, LOS: 31 ADMISSION DATE:  08/23/2020, CONSULTATION DATE:  08/30/2020 REFERRING MD:  Tat, CHIEF COMPLAINT:  Dyspnea   Brief History:  70 year old female admitted to [facility] 2/20 in the setting of septic shock from pneumonia requiring intubation, (extubated 2/25). Transferred to [facility] 2/27 in the setting of worsening mental status, worsening respiratory failure due to a left lung mucous plug.  Past Medical History:  Anemia Barrett's esophagus Systolic heart failure> LVEF 15 to 20% based on recent echocardiogram from [facility]  Diabetes mellitus type 2 Atrial fibrillation Hyperlipidemia Hypertension Seizure disorder Hypothyroidism  Significant Hospital Events:  2/20 Admitted to [facility], intubated 2/22 Stop amiodarone (esr 95) 2/23 Solumedrol trial @ 80 q 12 and reduced to 40 mg q 12h  Pm 2/25  2/25 Extubated 2/26 Worsening mental status/respiratory failure 2/27 Transferred to [facility], reintubated, bronched 2/28 Decreasing vent settings 3/01 Failed vent wean 2/2 increased HR/RR  Consults:  PCCM  Procedures:  ETT 2/20 >> 2/25, 2/27 >> R IJ CVL 2/21 >>  Significant Diagnostic Tests:   CT Head 2/21 >> mild atrophy, chronic microvascular disease Head CT 2/27 >>No evidence of new or progressive infarction.  CT Chest 2/21 >> b/l ASD, small b/l effusions  Micro Data:  2/20 COVID >> negative 2/20 Flu PCR >> negative 2/20 MRSA PCR >> negative 2/22 Resp Cx >> No wbc, few gpc >> abundant e. faecalis 2/22 BC x 2 (while on cefepime) >> 1/2 pos staph epi  2/22 Urine strep >> neg 2/22 Urine legionella >> neg 2/27 Resp Cx >> rare candida  Antimicrobials:  Vancomycin 2/20 (single dose), 2/24 (single dose) Cefepime 2/20 >> 2/24 Zosyn 2/24 >> 3/2  Interim History / Subjective:   Critically ill, intubated Low-grade febrile T-max 100.9 last 24 hours. Good urine output with Lasix Daughter at bedside  Objective   Blood pressure 129/78, pulse  81, temperature 100 F (37.8 C), temperature source Oral, resp. rate (!) 23, height '5\' 6"'  (1.676 m), weight 105.9 kg, SpO2 99 %.    Vent Mode: PSV;CPAP FiO2 (%):  [40 %] 40 % Set Rate:  [14 bmp] 14 bmp Vt Set:  [470 mL] 470 mL PEEP:  [5 cmH20] 5 cmH20 Pressure Support:  [10 cmH20-14 cmH20] 14 cmH20 Plateau Pressure:  [20 cmH20] 20 cmH20   Intake/Output Summary (Last 24 hours) at 09/04/2020 1017 Last data filed at 09/04/2020 1000 Gross per 24 hour  Intake 2986.11 ml  Output 1675 ml  Net 1311.11 ml   Filed Weights   09/02/20 0500 09/03/20 0500 09/04/20 0500  Weight: 104.7 kg 105.6 kg 105.9 kg    General:  Elderly, critically ill F intubated, no distress, frequent yawning HEENT: MM pink/moist, ETT in place, sclera anicteric Neuro: RASS 0 to +1, follows commands one-step, decreased movement right upper extremity CV: s1s2 rrr, no m/r/g PULM: Bilateral ventilated breath sounds, minimal secretions, no accessory muscle use  GI: soft, bsx4 active  Extremities: warm/dry, 1+ edema  Skin: no rashes or lesions   Chest x-ray 3/3 independently reviewed shows unchanged left lower lobe retrocardiac density, ET tube in position, cardiomegaly with mild vascular congestion  Labs show mild leukocytosis, hypokalemia, hyperglycemia and stable renal function  Resolved Hospital Problem list    Septic shock  Assessment & Plan:  Acute respiratory failure with hypoxemia due to left mainstem mucous plug Recent healthcare associated pneumonia - Finished course of Zosyn 3/2 S/p bronchoscopy 2/27 with total occlusion of L mainstem with mucous  plug, sent for Cx. P: -- Daily WUA/SBT , tolerating pressure support wean today - Pulmonary toilet (hypertonic saline, guaifenesin solution) -Lasix 40 mg daily    Need for sedation for mechanical ventilation - PAD protocol, goal RASS 0 to -1 - Fentanyl/Versed PRN  Atrial fibrillation -continue amiodarone , no evidence of fibrosis on CT scan -Apixaban held  due to hemoglobin drifting down, will rechallenge with IV heparin in 24 hours if stable  Anemia, likely of critical illness Baseline on admission 10 P: -Hgb drift since admission without evidence of acute bleeding -trend h/h, transfuse for Hgb <7  Chronic systolic heart failure Echo 2/26 with EF of 50-55% P: -Lasix 89m daily -monitor UOP and weight   Hypernatremia, likely in the setting of critical illness - Continue FWF 2020mQ4H  Diabetes mellitus type 2 Hyperglycemia likely 2/2 enteral nutrition - Monitor CBGs - Lantus up to 70 units, will split to 35 units every 12 - TF coverage 5U Q4H - SSI resistant scale  Need for enteral nutrition - Appreciate Dietician consult - Continue TFs  Hypothyroidism - Continue Synthroid  History of seizures - Continue Keppra  Dementia: - Continue Aricept  Best practice (evaluated daily)  Diet: TFs Pain/Anxiety/Delirium protocol (if indicated): As above VAP protocol (if indicated): In place DVT prophylaxis: Heparin gtt GI prophylaxis: PPI Glucose control: SSI Mobility: Bedrest Disposition: ICU  Goals of Care:  Last date of multidisciplinary goals of care discussion: 2/27 Family and staff present: Dr. TaCarles Colletdaughter Summary of discussion: full scope of practice Follow up goals of care discussion : 3/4, daughhter & RN LiLattie Haw full scope of care including reintubate and trach if needed, -Pt has already been through trach/LTACH, family turndown palliative care consultation Code Status: Full  Critical care time: 38 minutes     CRITICAL CARE Performed by: RaLeanna SatolAwanda MinkD. FCSh[facility] Pager : [phone]  If no response to pager , please call [phone] until 7 pm After 7:00 pm call Elink  [phone] 09/04/2020

## 2020-09-04 NOTE — Progress Notes (Signed)
SLP Cancellation Note  Patient Details Name: IMANE BURROUGH MRN: [id number] DOB: 1950/07/11   Cancelled treatment:       Reason Eval/Treat Not Completed: Patient not medically ready. SLP has been following throughout the week, but pt remains on the vent. Will sign off for now. Please reorder when ready.     Mahala Menghini., M.A. CCC-SLP Acute Rehabilitation Services Pager [phone] Office [phone]  09/04/2020, 7:14 AM

## 2020-09-04 NOTE — Progress Notes (Signed)
eLink Physician-Brief Progress Note Patient Name: [name] DOB: 1951-05-23 MRN: [id number]   Date of Service  09/04/2020  HPI/Events of Note  Multiple issues: 1. Hypernatremia - Na+ = 154 --> 150 --> 147 (Na+ improving with free water), 2. Hyperglycemia - Blood glucose = 269 and 3. Hypokalemia - K+ = 3.3 and Creatinine = 1.0.   eICU Interventions  Plan: 1. Increase Lantus to 70 units/day.  2. Replace K+.     Intervention Category Major Interventions: Electrolyte abnormality - evaluation and management;Hyperglycemia - active titration of insulin therapy  Lenell Antu 09/04/2020, 4:16 AM

## 2020-09-04 NOTE — Procedures (Signed)
Extubation Procedure Note  Patient Details:   Name: SHANETTA NICOLLS DOB: 11/09/1950 MRN: [id number]   Airway Documentation:    Vent end date: 09/04/20 Vent end time: 1208   Evaluation  O2 sats: stable throughout Complications: No apparent complications Patient did tolerate procedure well. Bilateral Breath Sounds: Clear,Diminished   Yes  5l/min [place] placed  Incentive spirometer  Newt Lukes 09/04/2020, 12:09 PM

## 2020-09-04 NOTE — Plan of Care (Signed)
  Problem: Respiratory: Goal: Ability to maintain adequate ventilation will improve Outcome: Progressing Goal: Ability to maintain a clear airway will improve Outcome: Progressing   Problem: Nutrition: Goal: Adequate nutrition will be maintained Outcome: Progressing   Problem: Elimination: Goal: Will not experience complications related to bowel motility Outcome: Progressing Goal: Will not experience complications related to urinary retention Outcome: Progressing   Problem: Respiratory: Goal: Ability to maintain a clear airway and adequate ventilation will improve Outcome: Progressing   Problem: Activity: Goal: Ability to tolerate increased activity will improve Outcome: Not Progressing   Problem: Activity: Goal: Risk for activity intolerance will decrease Outcome: Not Progressing   Problem: Activity: Goal: Ability to tolerate increased activity will improve Outcome: Not Progressing

## 2020-09-05 ENCOUNTER — Inpatient Hospital Stay (HOSPITAL_COMMUNITY): Payer: PPO

## 2020-09-05 DIAGNOSIS — J9601 Acute respiratory failure with hypoxia: Secondary | ICD-10-CM | POA: Diagnosis not present

## 2020-09-05 DIAGNOSIS — G9341 Metabolic encephalopathy: Secondary | ICD-10-CM | POA: Diagnosis not present

## 2020-09-05 DIAGNOSIS — J181 Lobar pneumonia, unspecified organism: Secondary | ICD-10-CM | POA: Diagnosis not present

## 2020-09-05 LAB — CBC
HCT: 26.5 % — ABNORMAL LOW (ref 36.0–46.0)
Hemoglobin: 8.3 g/dL — ABNORMAL LOW (ref 12.0–15.0)
MCH: 33.7 pg (ref 26.0–34.0)
MCHC: 31.3 g/dL (ref 30.0–36.0)
MCV: 107.7 fL — ABNORMAL HIGH (ref 80.0–100.0)
Platelets: 162 10*3/uL (ref 150–400)
RBC: 2.46 MIL/uL — ABNORMAL LOW (ref 3.87–5.11)
RDW: 16.3 % — ABNORMAL HIGH (ref 11.5–15.5)
WBC: 10.4 10*3/uL (ref 4.0–10.5)
nRBC: 1.5 % — ABNORMAL HIGH (ref 0.0–0.2)

## 2020-09-05 LAB — GLUCOSE, CAPILLARY
Glucose-Capillary: 68 mg/dL — ABNORMAL LOW (ref 70–99)
Glucose-Capillary: 121 mg/dL — ABNORMAL HIGH (ref 70–99)
Glucose-Capillary: 103 mg/dL — ABNORMAL HIGH (ref 70–99)
Glucose-Capillary: 115 mg/dL — ABNORMAL HIGH (ref 70–99)
Glucose-Capillary: 114 mg/dL — ABNORMAL HIGH (ref 70–99)
Glucose-Capillary: 172 mg/dL — ABNORMAL HIGH (ref 70–99)

## 2020-09-05 LAB — BASIC METABOLIC PANEL
Anion gap: 10 (ref 5–15)
BUN: 23 mg/dL (ref 8–23)
CO2: 24 mmol/L (ref 22–32)
Calcium: 9.4 mg/dL (ref 8.9–10.3)
Chloride: 115 mmol/L — ABNORMAL HIGH (ref 98–111)
Creatinine, Ser: 0.84 mg/dL (ref 0.44–1.00)
GFR, Estimated: >60 mL/min (ref >60)
Glucose, Bld: 168 mg/dL — ABNORMAL HIGH (ref 70–99)
Potassium: 3.5 mmol/L (ref 3.5–5.1)
Sodium: 149 mmol/L — ABNORMAL HIGH (ref 135–145)

## 2020-09-05 LAB — MAGNESIUM: Magnesium: 2 mg/dL (ref 1.7–2.4)

## 2020-09-05 LAB — PHOSPHORUS: Phosphorus: 3.5 mg/dL (ref 2.5–4.6)

## 2020-09-05 MED ORDER — ACETAMINOPHEN 325 MG PO TABS
650.0000 mg | ORAL_TABLET | Freq: Four times a day (QID) | ORAL | Status: DC | PRN
  Administered 2020-09-05 (×2): 650 mg via ORAL
  Filled 2020-09-05 (×2): qty 2

## 2020-09-05 MED ORDER — DEXTROSE 50 % IV SOLN
INTRAVENOUS | Status: AC
  Administered 2020-09-05: 25 mL
  Filled 2020-09-05: qty 50

## 2020-09-05 MED ORDER — LOPERAMIDE HCL 1 MG/7.5ML PO SUSP
1.0000 mg | Freq: Two times a day (BID) | ORAL | Status: DC | PRN
  Administered 2020-09-05: 1 mg via ORAL
  Filled 2020-09-05 (×2): qty 7.5

## 2020-09-05 MED ORDER — ASPIRIN 81 MG PO CHEW
81.0000 mg | CHEWABLE_TABLET | Freq: Every day | ORAL | Status: DC
  Administered 2020-09-05 – 2020-09-06 (×2): 81 mg via ORAL
  Filled 2020-09-05 (×2): qty 1

## 2020-09-05 MED ORDER — DOCUSATE SODIUM 50 MG/5ML PO LIQD: 100.0000 mg | Freq: Two times a day (BID) | ORAL | Status: DC

## 2020-09-05 MED ORDER — ACETAMINOPHEN 650 MG RE SUPP: 650.0000 mg | Freq: Four times a day (QID) | RECTAL | Status: DC | PRN

## 2020-09-05 MED ORDER — POTASSIUM CHLORIDE 20 MEQ PO PACK
20.0000 meq | PACK | Freq: Every day | ORAL | Status: DC
  Administered 2020-09-05 – 2020-09-06 (×2): 20 meq via ORAL
  Filled 2020-09-05 (×2): qty 1

## 2020-09-05 MED ORDER — POLYETHYLENE GLYCOL 3350 17 G PO PACK: 17.0000 g | PACK | Freq: Every day | ORAL | Status: DC

## 2020-09-05 MED ORDER — LEVOTHYROXINE SODIUM 25 MCG PO TABS
175.0000 ug | ORAL_TABLET | Freq: Every day | ORAL | Status: DC
  Administered 2020-09-06: 175 ug via ORAL
  Filled 2020-09-05: qty 1

## 2020-09-05 MED ORDER — GUAIFENESIN 100 MG/5ML PO SOLN
5.0000 mL | Freq: Four times a day (QID) | ORAL | Status: DC
  Administered 2020-09-05 – 2020-09-06 (×4): 100 mg via ORAL
  Filled 2020-09-05 (×5): qty 5

## 2020-09-05 MED ORDER — CHLORHEXIDINE GLUCONATE 0.12 % MT SOLN
15.0000 mL | Freq: Two times a day (BID) | OROMUCOSAL | Status: DC
  Administered 2020-09-05 – 2020-09-22 (×35): 15 mL via OROMUCOSAL
  Filled 2020-09-05 (×30): qty 15

## 2020-09-05 MED ORDER — INSULIN GLARGINE 100 UNIT/ML ~~LOC~~ SOLN
25.0000 [IU] | Freq: Two times a day (BID) | SUBCUTANEOUS | Status: DC
  Administered 2020-09-06 – 2020-09-09 (×6): 25 [IU] via SUBCUTANEOUS
  Filled 2020-09-05 (×9): qty 0.25

## 2020-09-05 MED ORDER — ORAL CARE MOUTH RINSE
15.0000 mL | Freq: Two times a day (BID) | OROMUCOSAL | Status: DC
  Administered 2020-09-05 – 2020-09-22 (×32): 15 mL via OROMUCOSAL

## 2020-09-05 MED ORDER — AMIODARONE HCL 200 MG PO TABS
200.0000 mg | ORAL_TABLET | Freq: Every day | ORAL | Status: DC
  Administered 2020-09-05 – 2020-09-06 (×2): 200 mg via ORAL
  Filled 2020-09-05 (×2): qty 1

## 2020-09-05 MED ORDER — PANTOPRAZOLE SODIUM 40 MG PO PACK
40.0000 mg | PACK | Freq: Every day | ORAL | Status: DC
  Filled 2020-09-05: qty 20

## 2020-09-05 MED ORDER — HYDRALAZINE HCL 50 MG PO TABS
25.0000 mg | ORAL_TABLET | Freq: Three times a day (TID) | ORAL | Status: DC
  Administered 2020-09-05 – 2020-09-06 (×4): 25 mg via ORAL
  Filled 2020-09-05 (×4): qty 1

## 2020-09-05 MED ORDER — LEVETIRACETAM 750 MG PO TABS
1500.0000 mg | ORAL_TABLET | Freq: Two times a day (BID) | ORAL | Status: DC
  Filled 2020-09-05: qty 2

## 2020-09-05 MED ORDER — APIXABAN 5 MG PO TABS
5.0000 mg | ORAL_TABLET | Freq: Two times a day (BID) | ORAL | Status: DC
  Administered 2020-09-05 – 2020-09-06 (×3): 5 mg via ORAL
  Filled 2020-09-05 (×3): qty 1

## 2020-09-05 MED ORDER — LEVETIRACETAM 100 MG/ML PO SOLN
1500.0000 mg | Freq: Two times a day (BID) | ORAL | Status: DC
  Administered 2020-09-05 – 2020-09-06 (×2): 1500 mg via ORAL
  Filled 2020-09-05 (×2): qty 15

## 2020-09-05 MED ORDER — DONEPEZIL HCL 5 MG PO TABS
5.0000 mg | ORAL_TABLET | Freq: Every day | ORAL | Status: DC
  Administered 2020-09-05: 5 mg via ORAL
  Filled 2020-09-05 (×2): qty 1

## 2020-09-05 NOTE — Progress Notes (Signed)
Pt taken of of BiPAP and on 4L [place]. Pt is tolerating well at the moment. Will continue to monitor.

## 2020-09-05 NOTE — Evaluation (Signed)
Occupational Therapy Evaluation Patient Details Name: [name] MRN: [id number] DOB: 1951-01-28 Today's Date: 09/05/2020    History of Present Illness 70 year old female admitted to [facility] 2/20 in the setting of septic shock from pneumonia requiring intubation, (extubated 2/25). Transferred to [facility] 2/27 with AMS, respiratory failure due to a left lung mucous plug, Intubated 2/27-3/4. PMhx: HTN, HLD, pulmonary HTN, CHF, AFib, DM, hypothyroidism, OSA   Clinical Impression   Pt admitted with above. She demonstrates the below listed deficits and will benefit from continued OT to maximize safety and independence with BADLs.  Pt presents to OT with significant debility - generalized weakness, decreased activity tolerance, balance impairment, as well as impaired cognition.  She currently required max A to move and maintain unsupported sitting in chair/egress position in the bed.  She was able to wipe her mouth with mod A, but requires total a for remainder of ADLs.  She was unable to attempt transfers this date.  PTA, pt lived with daughter and spouse.  Daughter reports pt mod I with ADLs.  Anticipate she will require post acute rehab as I am uncertain family will be able to provide necessary level of assist.        Follow Up Recommendations  SNF    Equipment Recommendations  None recommended by OT    Recommendations for Other Services       Precautions / Restrictions Precautions Precautions: Fall Restrictions Weight Bearing Restrictions: No      Mobility Bed Mobility Overal bed mobility: Needs Assistance Bed Mobility: Rolling;Supine to Sit Rolling: Max assist;Mod assist   Supine to sit: Max assist;+2 for safety/equipment     General bed mobility comments: Pt rolls to Lt with mod A, max A to roll to Rt.  She was moved into unsupported sitting with bed in chair/egress position    Transfers                 General transfer comment: uanble to safely attempt    Balance  Overall balance assessment: Needs assistance Sitting-balance support: Feet supported;Single extremity supported Sitting balance-Leahy Scale: Poor Sitting balance - Comments: pt maintained unsupported sitting in chair/egress position in the bed with max A +2 to periods of mod A                                   ADL either performed or assessed with clinical judgement   ADL Overall ADL's : Needs assistance/impaired Eating/Feeding: NPO   Grooming: Wash/dry face;Maximal assistance;Sitting Grooming Details (indicate cue type and reason): able to wash mouth chin with mod A in unsupported sitting in chair position in the bed.  She did not attempt to wash her face further despite max cuing Upper Body Bathing: Total assistance;Bed level   Lower Body Bathing: Total assistance;Bed level   Upper Body Dressing : Total assistance;Bed level   Lower Body Dressing: Total assistance;Bed level   Toilet Transfer: Total assistance Toilet Transfer Details (indicate cue type and reason): unable to attempt Toileting- Clothing Manipulation and Hygiene: Total assistance;Bed level Toileting - Clothing Manipulation Details (indicate cue type and reason): flexi seal in place     Functional mobility during ADLs: Total assistance       Vision Baseline Vision/History: No visual deficits Additional Comments: pt will visually fixate intermittently, and will track to therapist and daughter ~25% of the time with max stimuli     Perception     Praxis  Pertinent Vitals/Pain Pain Assessment: Faces Faces Pain Scale: Hurts a little bit Pain Location: generalized grimacing Pain Descriptors / Indicators: Grimacing Pain Intervention(s): Monitored during session;Repositioned     Hand Dominance Right   Extremity/Trunk Assessment Upper Extremity Assessment Upper Extremity Assessment: Generalized weakness;RUE deficits/detail;LUE deficits/detail RUE Deficits / Details: ROM grossly WFL.   Shoulders grossly 1/5; elbows grossly 3/5; hands grossly 3-/5 RUE Coordination: decreased fine motor;decreased gross motor LUE Coordination: decreased fine motor;decreased gross motor   Lower Extremity Assessment Lower Extremity Assessment: Defer to PT evaluation   Cervical / Trunk Assessment Cervical / Trunk Assessment: Kyphotic;Other exceptions Cervical / Trunk Exceptions: significant trunk weakness noted. Maintains head/neck and trunk in flexed position.  able to extend head/neck with max assist/facilitation   Communication Communication Communication: Expressive difficulties   Cognition Arousal/Alertness: Lethargic Behavior During Therapy: Flat affect Overall Cognitive Status: Impaired/Different from baseline Area of Impairment: Attention;Following commands;Problem solving                   Current Attention Level: Focused   Following Commands: Follows one step commands inconsistently;Follows one step commands with increased time     Problem Solving: Slow processing;Decreased initiation;Difficulty sequencing;Requires verbal cues;Requires tactile cues General Comments: Pt lethargic.  with max multimodal cueing, she will follow occasional simple commands with a significant delay.  She will vocalize very minimally   General Comments  VSS Pt on 3.5L supplemental 02.  Daughter present    Exercises     Shoulder Instructions      Home Living Family/patient expects to be discharged to:: Private residence Living Arrangements: Children Available Help at Discharge: Family;Available 24 hours/day Type of Home: Mobile home Home Access: Stairs to enter Entrance Stairs-Number of Steps: 5 Entrance Stairs-Rails: Right;Left Home Layout: One level     Bathroom Shower/Tub: Producer, television/film/video: Standard     Home Equipment: Bedside commode;Toilet riser;Shower seat;Walker - 2 wheels;Cane - single point;Wheelchair - manual   Additional Comments: wears O2 2L at  home.  Lives with daughter, and elderly spouse.  They are able to provide 24 hour supervision up to ? min A      Prior Functioning/Environment Level of Independence: Needs assistance  Gait / Transfers Assistance Needed: pt normally walks with RW ADL's / Homemaking Assistance Needed: family does all the homemaking, prepackaged medication, independent with ADLs supervision for bathing   Comments: history of falls        OT Problem List: Decreased strength;Decreased activity tolerance;Decreased range of motion;Impaired balance (sitting and/or standing);Impaired vision/perception;Decreased coordination;Decreased cognition;Decreased safety awareness;Decreased knowledge of use of DME or AE;Decreased knowledge of precautions;Obesity;Impaired UE functional use;Cardiopulmonary status limiting activity      OT Treatment/Interventions:      OT Goals(Current goals can be found in the care plan section) Acute Rehab OT Goals Patient Stated Goal: for pt to regain strength (per daughter) OT Goal Formulation: With patient/family Time For Goal Achievement: 09/19/20 Potential to Achieve Goals: Good ADL Goals Pt Will Perform Grooming: with mod assist;sitting Pt Will Perform Upper Body Bathing: with max assist;sitting Pt/caregiver will Perform Home Exercise Program: Increased ROM;Increased strength;Right Upper extremity;Left upper extremity;With written HEP provided;With Supervision Additional ADL Goal #1: Pt will actively participate in 15 mins therapeutic activity with min cues and no more than 2 rest breaks Additional ADL Goal #2: Pt will follow one step simple commands consistently during familiar ADL activities  OT Frequency: Min 2X/week   Barriers to D/C: Decreased caregiver support  uncertain that family will be able to  provide necessary level of physical assist pt will need at discharge       Co-evaluation PT/OT/SLP Co-Evaluation/Treatment: Yes Reason for Co-Treatment: For patient/therapist  safety;To address functional/ADL transfers;Necessary to address cognition/behavior during functional activity   OT goals addressed during session: ADL's and self-care;Strengthening/ROM      AM-PAC OT "6 Clicks" Daily Activity     Outcome Measure Help from another person eating meals?: Total Help from another person taking care of personal grooming?: Total Help from another person toileting, which includes using toliet, bedpan, or urinal?: Total Help from another person bathing (including washing, rinsing, drying)?: Total Help from another person to put on and taking off regular upper body clothing?: Total Help from another person to put on and taking off regular lower body clothing?: Total 6 Click Score: 6   End of Session Equipment Utilized During Treatment: Oxygen Nurse Communication: Mobility status;Need for lift equipment  Activity Tolerance: Patient limited by fatigue;Patient limited by lethargy Patient left: in bed;with call bell/phone within reach;with family/visitor present  OT Visit Diagnosis: Unsteadiness on feet (R26.81);Cognitive communication deficit (R41.841)                Time: 2620-3559 OT Time Calculation (min): 26 min Charges:  OT General Charges $OT Visit: 1 Visit OT Evaluation $OT Eval Moderate Complexity: 1 Mod  Eber Jones., OTR/L Acute Rehabilitation Services Pager [phone] Office [phone]   Jeani Hawking M 09/05/2020, 9:20 AM

## 2020-09-05 NOTE — Progress Notes (Signed)
NAME:  [name], MRN:  [id number], DOB:  [birth year], LOS: 12 ADMISSION DATE:  08/23/2020, CONSULTATION DATE:  08/30/2020 REFERRING MD:  Tat, CHIEF COMPLAINT:  Dyspnea   Brief History:  70 year old female admitted to [facility] 2/20 in the setting of septic shock from pneumonia requiring intubation, (extubated 2/25). Transferred to [facility] 2/27 in the setting of worsening mental status, worsening respiratory failure due to a left lung mucous plug.  Past Medical History:  Anemia Barrett's esophagus Systolic heart failure> LVEF 15 to 20% based on recent echocardiogram from [facility]  Diabetes mellitus type 2 Atrial fibrillation Hyperlipidemia Hypertension Seizure disorder Hypothyroidism  Significant Hospital Events:  2/20 Admitted to [facility], intubated 2/22 Stop amiodarone (esr 95) 2/23 Solumedrol trial @ 80 q 12 and reduced to 40 mg q 12h  Pm 2/25  2/25 Extubated 2/26 Worsening mental status/respiratory failure 2/27 Transferred to [facility], reintubated, bronched 2/28 Decreasing vent settings 3/01 Failed vent wean 2/2 increased HR/RR 3/4 extubated , bipap q hs  Consults:  PCCM  Procedures:  ETT 2/20 >> 2/25, 2/27 >>3/4 R IJ CVL 2/21 >>  Significant Diagnostic Tests:   CT Head 2/21 >> mild atrophy, chronic microvascular disease Head CT 2/27 >>No evidence of new or progressive infarction.  CT Chest 2/21 >> b/l ASD, small b/l effusions  Micro Data:  2/20 COVID >> negative 2/20 Flu PCR >> negative 2/20 MRSA PCR >> negative 2/22 Resp Cx >> No wbc, few gpc >> abundant e. faecalis 2/22 BC x 2 (while on cefepime) >> 1/2 pos staph epi  2/22 Urine strep >> neg 2/22 Urine legionella >> neg 2/27 Resp Cx >> rare candida  Antimicrobials:  Vancomycin 2/20 (single dose), 2/24 (single dose) Cefepime 2/20 >> 2/24 Zosyn 2/24 >> 3/2  Interim History / Subjective:   Did well with extubation yesterday. Used BiPAP overnight Good urine output with Lasix Afebrile Blood pressure trending high Objective    Blood pressure (!) 174/94, pulse 91, temperature 97.9 F (36.6 C), temperature source Oral, resp. rate 19, height 5' 6" (1.676 m), weight 105.7 kg, SpO2 94 %.    Vent Mode: PCV;BIPAP FiO2 (%):  [40 %] 40 % Set Rate:  [15 bmp] 15 bmp PEEP:  [5 cmH20] 5 cmH20 Pressure Support:  [5 cmH20] 5 cmH20   Intake/Output Summary (Last 24 hours) at 09/05/2020 1027 Last data filed at 09/05/2020 0800 Gross per 24 hour  Intake 501.65 ml  Output 2550 ml  Net -2048.35 ml   Filed Weights   09/03/20 0500 09/04/20 0500 09/05/20 0400  Weight: 105.6 kg 105.9 kg 105.7 kg    General:  Elderly, chronically ill-appearing, no distress HEENT: MM pink/moist,  sclera anicteric Neuro: Follows one-step commands, moves all 4 extremities CV: s1s2 rrr, no m/r/g PULM: Bilateral decreased breath sounds, no rhonchi,  no accessory muscle use  GI: soft, bsx4 active  Extremities: warm/dry, 1+ edema  Skin: no rashes or lesions   Chest x-ray 3/5 independently reviewed shows low lung volumes, persistent left lower lobe opacity?  Atelectasis versus consolidation  Labs show decrease leukocytosis, stable anemia, mild hypernatremia, normal renal function  Resolved Hospital Problem list    Septic shock  Assessment & Plan:  Acute respiratory failure with hypoxemia due to left mainstem mucous plug Recent healthcare associated pneumonia - Finished course of Zosyn 3/2 S/p bronchoscopy 2/27 with total occlusion of L mainstem with mucous plug, sent for Cx. P: --BiPAP nightly - Pulmonary toilet (hypertonic saline, guaifenesin , chest PT via bed ) -Lasix 40 mg  daily   Atrial fibrillation -continue amiodarone , no evidence of fibrosis on CT scan -Apixaban held due to hemoglobin drifting down, will resume -Low-dose metoprolol  Anemia, likely of critical illness Baseline on admission 10 P: -Hgb drift since admission without evidence of acute bleeding -trend h/h, transfuse for Hgb <7  Chronic systolic heart  failure Echo 2/26 with EF of 50-55% P: -Lasix 36m daily -Resume hydralazine -monitor UOP and weight   Hypernatremia -Follow, avoid overdiuresis  Diabetes mellitus type 2 Hyperglycemia likely 2/2 enteral nutrition - Monitor CBGs - Lantus up to 70 units, will decrease to 25 units every 12 now that oral intake is decreased - SSI resistant scale   Hypothyroidism - Continue Synthroid  History of seizures - Continue Keppra, changed to oral  Dementia: - Continue Aricept  DC central line if peripheral IV obtained, daughter updated at bedside  Best practice (evaluated daily)  Diet: TFs Pain/Anxiety/Delirium protocol (if indicated): As above VAP protocol (if indicated): In place DVT prophylaxis: resume apixaban  GI prophylaxis: PPI Glucose control: SSI Mobility: PT Disposition: ICU , change to progressive care soon  Goals of Care:  Last date of multidisciplinary goals of care discussion: 2/27 Family and staff present: Dr. TCarles Collet daughter Summary of discussion: full scope of practice Follow up goals of care discussion : 3/4, daughter & RN LLattie Haw, full scope of care including reintubate and trach if needed, -Pt has already been through trach/LTACH, family turndown palliative care consultation Code Status: Full  Critical care time: 31 minutes     CRITICAL CARE Performed by: RLeanna SatoAAwanda MinkMD. FShade Flood [place] [facility] Pager : [phone]  If no response to pager , please call [phone] until 7 pm After 7:00 pm call Elink  [phone]  09/05/2020

## 2020-09-05 NOTE — Evaluation (Signed)
Clinical/Bedside Swallow Evaluation Patient Details  Name: ALIYYAH RIESE MRN: [id number] Date of Birth: [birth year]  Today's Date: 09/05/2020 Time: SLP Start Time (ACUTE ONLY): 0820 SLP Stop Time (ACUTE ONLY): 0836 SLP Time Calculation (min) (ACUTE ONLY): 16 min  Past Medical History:  Past Medical History:  Diagnosis Date  . Abnormal pulmonary function test   . Anemia    H/H of 10/30 with a normal MCV in 12/09  . Anxiety   . Arthritis   . Barrett's esophagus    Diagnosed 1995. Last EGD 2016-NO BARRETT'S.   . Chest pain    Negative cardiac catheterization in 2002; negative stress nuclear study in 2008  . Chronic anticoagulation   . Chronic combined systolic and diastolic CHF (congestive heart failure) (HCC)    a. EF predominantly normal during prior echoes but was 40% during 10/2014 echo. b. Most recent 01/2015 EF normal, 55-60%.  . Chronic LBP    Surgical intervention in 1996  . Diabetes mellitus, type 2 (HCC)    Insulin therapy; exacerbated by prednisone  . Dysrhythmia    AFib  . Gastroparesis    99% retention 05/2008 on GES  . GERD (gastroesophageal reflux disease)   . Hiatal hernia   . Hyperlipidemia   . Hypertension   . Hypothyroid   . IBS (irritable bowel syndrome)   . Obesity   . OSA on CPAP    had CPAP and cannot tolerate.  . Paroxysmal atrial fibrillation (HCC)   . Pulmonary hypertension (HCC) 01/2015   a. Predominantly pulmonary venous hypertension but may be component of PAH.  . Seizures (HCC)    last seizure was 2 years ago; on keppra for this; unknown etiology  . Syncope    a. Admitted 05/2009; magnetic resonance imagin/ MRA - negative; etiology thought to be orthostasis secondary to drugs and dehydration. b. Syncope 02/2015 also felt 2/2 dehydration.   Past Surgical History:  Past Surgical History:  Procedure Laterality Date  . BACK SURGERY  1996  . BIOPSY N/A 11/08/2013   Procedure: BIOPSY  / Tissue sampling / ulcers present in small intestine;  Surgeon:  West Bali, MD;  [facility];  Service: Endoscopy;  Laterality: N/A;  . CARDIAC CATHETERIZATION  2002  . CARDIAC CATHETERIZATION N/A 7/72/2016   Procedure: Right Heart Cath;  Surgeon: Laurey Morale, MD;  Location: [facility] INVASIVE CV LAB;  Service: Cardiovascular;  Laterality: N/A;  . CARDIOVASCULAR STRESS TEST  2008   Stress nuclear study  . CARDIOVERSION N/A 03/06/2015   Procedure: CARDIOVERSION;  Surgeon: Laurey Morale, MD;  Location: [facility] ENDOSCOPY;  Service: Cardiovascular;  Laterality: N/A;  . CARPAL TUNNEL RELEASE  1994  . COLONOSCOPY  11/2011   Dr. Darrick Penna: Internal hemorrhoids, mild diverticulosis. Random colon biopsies negative.  . COLONOSCOPY N/A 11/08/2013   SLF: Normal mucosa in the terminal ileum/The colon IS redundant/  Moderate diverticulosis throughout the entire colon. ileum bx benign. colon bx benign  . ESOPHAGOGASTRODUODENOSCOPY  2008   Barrett's without dysplasia. esphagus dilated. antral erosions, h.pylori serologies negative.  . ESOPHAGOGASTRODUODENOSCOPY  11/2011   Dr. Darrick Penna: Barrett's esophagus, mild gastritis, diverticulum in the second portion of the duodenum repeat EGD 3 years. Small bowel biopsies negative. Gastric biopsy show reactive gastropathy but no H. pylori. Esophageal biopsies consistent with GERD. Next EGD 11/2014  . ESOPHAGOGASTRODUODENOSCOPY N/A 11/21/2014   NID:POEU non-erosive gastritis/irregular z-line  . GIVENS CAPSULE STUDY  12/07/2011   Proximal small bowel, rare AVM. Distal small bowel, multiple ulcers noted  .  GIVENS CAPSULE STUDY N/A 09/27/2013   Distal small bowel ulcers extending to TI.  Marland Kitchen GIVENS CAPSULE STUDY N/A 10/10/2013   Procedure: GIVENS CAPSULE STUDY;  Surgeon: West Bali, MD;  Location: [facility];  Service: Endoscopy;  Laterality: N/A;  7:30  . IR KYPHO THORACIC WITH BONE BIOPSY  02/09/2018  . KNEE ARTHROSCOPY WITH MEDIAL MENISECTOMY Right 06/09/2016   Procedure: KNEE ARTHROSCOPY WITH MEDIAL MENISECTOMY;  Surgeon: Vickki Hearing, MD;  Location: [facility] ORS;  Service: Orthopedics;  Laterality: Right;  medial and lateral menisectomy  . LAMINECTOMY  1995   L4-L5  . LAPAROSCOPIC CHOLECYSTECTOMY  1990s  . LEFT HEART CATHETERIZATION WITH CORONARY ANGIOGRAM  01/10/2014   Procedure: LEFT HEART CATHETERIZATION WITH CORONARY ANGIOGRAM;  Surgeon: Lesleigh Noe, MD;  Location: [facility] CATH LAB;  Service: Cardiovascular;;  . RIGHT HEART CATHETERIZATION N/A 01/10/2014   Procedure: RIGHT HEART CATH;  Surgeon: Lesleigh Noe, MD;  Location: [facility] CATH LAB;  Service: Cardiovascular;  Laterality: N/A;  . TOTAL ABDOMINAL HYSTERECTOMY  1999  . TRACHEOSTOMY TUBE PLACEMENT N/A 09/01/2017   Procedure: TRACHEOSTOMY;  Surgeon: Drema Halon, MD;  Location: [facility] OR;  Service: ENT;  Laterality: N/A;   HPI:  70 y/o female admitted to [facility] on 20 February in the setting of septic shock from pneumonia requiring intubation, extubated on February 72.  Transferred to [facility] on February 27 in the setting of worsening mental status, worsening respiratory failure due to a left lung mucous plug.  The patient was intubated upon arrival and could not provide further history.  History was obtained from chart review.  She had previously been followed by the hospitalist service as well as critical care medicine.  She had been treated for healthcare associated pneumonia.  Of note, she had a fall at home prior to admission.  She has atrial fibrillation at baseline and has been on amiodarone, after admission this was held for concern of possible amiodarone induced lung toxicity and she was treated with steroids.   In consultation with the pulmonary critical care service in [place] it was decided that she should be electively intubated and then transferred to our campus for a bronchoscopy.  Most recent chest xray is showing interval extubation with marked interval decrease in pulmonary insufflation and persistent retrocardiac  opacification consistent with atelectasis within this region.  She is currently on a 4L nasal canula after successful extubation yesterday.   Assessment / Plan / Recommendation Clinical Impression  Clinical swallowing evaluation was completed using ice chips, thin liquids via spoon and pureed material.  Patient with prolonged intubation over course of 2 intubations for a total of 11 days.  She was successfully extubated yesterday initially to BiPAP but was transitioned to a 4L nasal canula this AM.   Vocal volume was noted to be low when patient did verbalize.  However, verbalizations were limited.  Cranial nerve exam was unable to be completed due to her difficulty following commands.  Patient was lethargic and required prompts to maintain alertness.  Her daughter whom was present at the bedside reported no swallowing issues prior to hospital stay and that she was fairly independent prior to admission.   She did report a remote history of esophageal dilation that was done many years ago with no recent reported concerns for symptoms consistent with esophageal dysphagia.  There is concern for prandial aspiration due to prolonged intubation and current mentation and alertness level.  Patient noted to orally  manipulate all presented material with swallow trigger appreciated to palpation.  Changes to vocal quality were not noted post swallow.  Patient with no overt s/s of aspiration following all presentations.  However, given prolonged intubation and current mentation/alertness level recommend she remain NPO except ice chips and medications crushed in pureed material.  ST will followed for PO readiness.   SLP Visit Diagnosis: Dysphagia, unspecified (R13.10)    Aspiration Risk  Moderate aspiration risk    Diet Recommendation   NPO except ice chips and medications crushed in pureed material.    Medication Administration: Crushed with puree    Other  Recommendations Oral Care Recommendations: Oral care  QID;Oral care prior to ice chip/H20   Follow up Recommendations Other (comment) (TBD)      Frequency and Duration min 2x/week  2 weeks       Prognosis Prognosis for Safe Diet Advancement: Good Barriers to Reach Goals: Cognitive deficits      Swallow Study   General Date of Onset: 08/23/20 HPI: 70 y/o female admitted to [facility] on 20 February in the setting of septic shock from pneumonia requiring intubation, extubated on February 72.  Transferred to [facility] on February 27 in the setting of worsening mental status, worsening respiratory failure due to a left lung mucous plug.  The patient was intubated upon arrival and could not provide further history.  History was obtained from chart review.  She had previously been followed by the hospitalist service as well as critical care medicine.  She had been treated for healthcare associated pneumonia.  Of note, she had a fall at home prior to admission.  She has atrial fibrillation at baseline and has been on amiodarone, after admission this was held for concern of possible amiodarone induced lung toxicity and she was treated with steroids.   In consultation with the pulmonary critical care service in [place] it was decided that she should be electively intubated and then transferred to our campus for a bronchoscopy.  Most recent chest xray is showing interval extubation with marked interval decrease in pulmonary insufflation and persistent retrocardiac opacification consistent with atlectasis within this region.  She is currently on a 4L nasal canula after successful extubation yesterday. Type of Study: Bedside Swallow Evaluation Previous Swallow Assessment: None noted at [facility]. Diet Prior to this Study: NPO Temperature Spikes Noted: Yes Respiratory Status: Nasal cannula History of Recent Intubation: Yes Length of Intubations (days): 11 days Date extubated: 09/04/20 Behavior/Cognition: Lethargic/Drowsy;Requires  cueing;Doesn't follow directions Oral Cavity Assessment: Within Functional Limits Oral Care Completed by SLP: No Oral Cavity - Dentition: Dentures, top Vision: Functional for self-feeding Self-Feeding Abilities: Total assist Patient Positioning: Upright in bed Baseline Vocal Quality: Low vocal intensity Volitional Cough: Cognitively unable to elicit Volitional Swallow: Unable to elicit    Oral/Motor/Sensory Function Overall Oral Motor/Sensory Function: Other (comment) (Unable to assess)   Ice Chips Ice chips: Impaired Presentation: Spoon Oral Phase Impairments: Impaired mastication Oral Phase Functional Implications: Prolonged oral transit   Thin Liquid Thin Liquid: Within functional limits Presentation: Spoon    Nectar Thick Nectar Thick Liquid: Not tested   Honey Thick Honey Thick Liquid: Not tested   Puree Puree: Within functional limits Presentation: Spoon   Solid     Solid: Not tested     Dimas Aguas, MA, CCC-SLP Acute Rehab SLP [phone]  Fleet Contras 09/05/2020,8:48 AM

## 2020-09-05 NOTE — Evaluation (Addendum)
Physical Therapy Evaluation Patient Details Name: [name] MRN: [id number] DOB: [birth year] Today's Date: 09/05/2020   History of Present Illness  70 year old female admitted to [facility] 2/20 in the setting of septic shock from pneumonia requiring intubation, (extubated 2/25). Transferred to [facility] 2/27 with AMS, respiratory failure due to a left lung mucous plug, Intubated 2/27-3/4. PMhx: HTN, HLD, pulmonary HTN, CHF, AFib, DM, hypothyroidism, OSA  Clinical Impression  Pt with flat affect, limited vocalization with delayed response to commands and questions and unable to state her name or daughters name. She will reply with yes/no at times. Pt followed limited commands for attempting to wash face and lean forward at EOB. Pt with significant generalized weakness, cognitive deficits, impaired mobility and currently max +2 assist for all bed level mobility who will require lift at present for OOB. Pt will benefit from acute therapy to maximize mobility, safety, function and cognition to decrease burden of care.      Follow Up Recommendations SNF;Supervision/Assistance - 24 hour    Equipment Recommendations  Wheelchair (measurements PT);Hospital bed;Other (comment) (hoyer lift)    Recommendations for Other Services       Precautions / Restrictions Precautions Precautions: Fall;Other (comment) Precaution Comments: flexiseal Restrictions Weight Bearing Restrictions: No      Mobility  Bed Mobility Overal bed mobility: Needs Assistance Bed Mobility: Rolling;Supine to Sit Rolling: Max assist;Mod assist   Supine to sit: Max assist;+2 for safety/equipment     General bed mobility comments: Pt rolls to Lt with mod A (spontaneously), max A to roll to Rt. (on command)  She was moved into unsupported sitting with bed in chair/egress position. Max assist to scoot to edge of surface. total assist to return to supine and total +2 to slide toward [facility]    Transfers                 General  transfer comment: unable to attempt  Ambulation/Gait                Stairs            Wheelchair Mobility    Modified Rankin (Stroke Patients Only)       Balance Overall balance assessment: Needs assistance Sitting-balance support: Feet supported;Single extremity supported Sitting balance-Leahy Scale: Poor Sitting balance - Comments: pt maintained unsupported sitting in chair/egress position in the bed with mod A for 8 min with periods of max +2 assist                                     Pertinent Vitals/Pain Pain Assessment: Faces Faces Pain Scale: Hurts a little bit Pain Location: generalized grimacing Pain Descriptors / Indicators: Grimacing Pain Intervention(s): Limited activity within patient's tolerance;Repositioned;Monitored during session    Home Living Family/patient expects to be discharged to:: Private residence Living Arrangements: Children Available Help at Discharge: Family;Available 24 hours/day Type of Home: Mobile home Home Access: Stairs to enter Entrance Stairs-Rails: Right;Left Entrance Stairs-Number of Steps: 5 Home Layout: One level Home Equipment: Bedside commode;Toilet riser;Shower seat;Walker - 2 wheels;Cane - single point;Wheelchair - manual Additional Comments: wears O2 2L at home.  Lives with daughter, and elderly spouse.  They are able to provide 24 hour supervision up to ? min A    Prior Function Level of Independence: Needs assistance   Gait / Transfers Assistance Needed: pt normally walks with RW  ADL's / Homemaking Assistance Needed: family does all  the homemaking, prepackaged medication, independent with ADLs supervision for bathing  Comments: history of falls     Hand Dominance   Dominant Hand: Right    Extremity/Trunk Assessment   Upper Extremity Assessment Upper Extremity Assessment: Defer to OT evaluation     Lower Extremity Assessment Lower Extremity Assessment: LLE deficits/detail;RLE  deficits/detail;Generalized weakness RLE Deficits / Details: no significant active movement noted with lack of command following. She did lift leg to roll spontaneously toward left LLE Deficits / Details: no active movement with only toe wiggle with noxious stimuli    Cervical / Trunk Assessment Cervical / Trunk Assessment: Kyphotic Cervical / Trunk Exceptions: significant trunk weakness noted. Maintains head/neck and trunk in flexed position.  able to extend head/neck with max assist/facilitation and with neck extension posterior lean  Communication   Communication: Expressive difficulties  Cognition Arousal/Alertness: Lethargic Behavior During Therapy: Flat affect Overall Cognitive Status: Impaired/Different from baseline Area of Impairment: Attention;Following commands;Problem solving                   Current Attention Level: Focused   Following Commands: Follows one step commands inconsistently;Follows one step commands with increased time     Problem Solving: Slow processing;Decreased initiation;Difficulty sequencing;Requires verbal cues;Requires tactile cues General Comments: Pt lethargic.  with max multimodal cueing, she will follow occasional simple commands with a significant delay.  She will vocalize very minimally, would not state daughter's name or her name      General Comments General comments (skin integrity, edema, etc.): VSS Pt on 3.5L supplemental 02.  Daughter present    Exercises     Assessment/Plan    PT Assessment Patient needs continued PT services  PT Problem List Decreased strength;Decreased mobility;Decreased activity tolerance;Decreased balance;Decreased coordination;Decreased range of motion;Obesity       PT Treatment Interventions DME instruction;Gait training;Functional mobility training;Therapeutic activities;Patient/family education;Cognitive remediation;Neuromuscular re-education;Balance training;Therapeutic exercise    PT Goals  (Current goals can be found in the Care Plan section)  Acute Rehab PT Goals Patient Stated Goal: for pt to regain strength (per daughter) PT Goal Formulation: With patient/family Time For Goal Achievement: 09/19/20 Potential to Achieve Goals: Fair    Frequency Min 3X/week   Barriers to discharge Decreased caregiver support      Co-evaluation PT/OT/SLP Co-Evaluation/Treatment: Yes Reason for Co-Treatment: Complexity of the patient's impairments (multi-system involvement);For patient/therapist safety PT goals addressed during session: Mobility/safety with mobility;Balance OT goals addressed during session: ADL's and self-care;Strengthening/ROM       AM-PAC PT "6 Clicks" Mobility  Outcome Measure Help needed turning from your back to your side while in a flat bed without using bedrails?: Total Help needed moving from lying on your back to sitting on the side of a flat bed without using bedrails?: Total Help needed moving to and from a bed to a chair (including a wheelchair)?: Total Help needed standing up from a chair using your arms (e.g., wheelchair or bedside chair)?: Total Help needed to walk in hospital room?: Total Help needed climbing 3-5 steps with a railing? : Total 6 Click Score: 6    End of Session Equipment Utilized During Treatment: Oxygen Activity Tolerance: Patient tolerated treatment well Patient left: in bed;with call bell/phone within reach;with family/visitor present;with bed alarm set Nurse Communication: Mobility status;Need for lift equipment PT Visit Diagnosis: Other abnormalities of gait and mobility (R26.89);Muscle weakness (generalized) (M62.81);Difficulty in walking, not elsewhere classified (R26.2)    Time: 2683-4196 PT Time Calculation (min) (ACUTE ONLY): 26 min   Charges:  PT Evaluation $PT Eval Moderate Complexity: 1 Mod          Catarina Huntley P, PT Acute Rehabilitation Services Pager: [phone] Office: [phone]   Enedina Finner  Kennidy Lamke 09/05/2020, 11:04 AM

## 2020-09-06 ENCOUNTER — Inpatient Hospital Stay (HOSPITAL_COMMUNITY): Payer: PPO

## 2020-09-06 DIAGNOSIS — J181 Lobar pneumonia, unspecified organism: Secondary | ICD-10-CM | POA: Diagnosis not present

## 2020-09-06 LAB — POCT I-STAT 7, (LYTES, BLD GAS, ICA,H+H)
Acid-Base Excess: 3 mmol/L — ABNORMAL HIGH (ref 0.0–2.0)
Bicarbonate: 26.3 mmol/L (ref 20.0–28.0)
Calcium, Ion: 1.27 mmol/L (ref 1.15–1.40)
HCT: 27 % — ABNORMAL LOW (ref 36.0–46.0)
Hemoglobin: 9.2 g/dL — ABNORMAL LOW (ref 12.0–15.0)
O2 Saturation: 94 %
Potassium: 3.4 mmol/L — ABNORMAL LOW (ref 3.5–5.1)
Sodium: 153 mmol/L — ABNORMAL HIGH (ref 135–145)
TCO2: 27 mmol/L (ref 22–32)
pCO2 arterial: 36.3 mmHg (ref 32.0–48.0)
pH, Arterial: 7.468 — ABNORMAL HIGH (ref 7.350–7.450)
pO2, Arterial: 67 mmHg — ABNORMAL LOW (ref 83.0–108.0)

## 2020-09-06 LAB — BASIC METABOLIC PANEL
Anion gap: 14 (ref 5–15)
BUN: 27 mg/dL — ABNORMAL HIGH (ref 8–23)
CO2: 23 mmol/L (ref 22–32)
Calcium: 9.4 mg/dL (ref 8.9–10.3)
Chloride: 113 mmol/L — ABNORMAL HIGH (ref 98–111)
Creatinine, Ser: 0.97 mg/dL (ref 0.44–1.00)
GFR, Estimated: >60 mL/min (ref >60)
Glucose, Bld: 178 mg/dL — ABNORMAL HIGH (ref 70–99)
Potassium: 3.7 mmol/L (ref 3.5–5.1)
Sodium: 150 mmol/L — ABNORMAL HIGH (ref 135–145)

## 2020-09-06 LAB — GLUCOSE, CAPILLARY
Glucose-Capillary: 133 mg/dL — ABNORMAL HIGH (ref 70–99)
Glucose-Capillary: 144 mg/dL — ABNORMAL HIGH (ref 70–99)
Glucose-Capillary: 149 mg/dL — ABNORMAL HIGH (ref 70–99)
Glucose-Capillary: 152 mg/dL — ABNORMAL HIGH (ref 70–99)
Glucose-Capillary: 153 mg/dL — ABNORMAL HIGH (ref 70–99)
Glucose-Capillary: 159 mg/dL — ABNORMAL HIGH (ref 70–99)
Glucose-Capillary: 177 mg/dL — ABNORMAL HIGH (ref 70–99)

## 2020-09-06 LAB — CBC
HCT: 28.1 % — ABNORMAL LOW (ref 36.0–46.0)
Hemoglobin: 8.6 g/dL — ABNORMAL LOW (ref 12.0–15.0)
MCH: 33.2 pg (ref 26.0–34.0)
MCHC: 30.6 g/dL (ref 30.0–36.0)
MCV: 108.5 fL — ABNORMAL HIGH (ref 80.0–100.0)
Platelets: 182 10*3/uL (ref 150–400)
RBC: 2.59 MIL/uL — ABNORMAL LOW (ref 3.87–5.11)
RDW: 17.2 % — ABNORMAL HIGH (ref 11.5–15.5)
WBC: 8.9 10*3/uL (ref 4.0–10.5)
nRBC: 1.7 % — ABNORMAL HIGH (ref 0.0–0.2)

## 2020-09-06 MED ORDER — LEVETIRACETAM IN NACL 1500 MG/100ML IV SOLN
1500.0000 mg | Freq: Two times a day (BID) | INTRAVENOUS | Status: DC
  Administered 2020-09-06 – 2020-09-07 (×2): 1500 mg via INTRAVENOUS
  Filled 2020-09-06 (×2): qty 100

## 2020-09-06 MED ORDER — ENOXAPARIN SODIUM 100 MG/ML ~~LOC~~ SOLN
100.0000 mg | Freq: Two times a day (BID) | SUBCUTANEOUS | Status: DC
  Administered 2020-09-06 – 2020-09-07 (×3): 100 mg via SUBCUTANEOUS
  Filled 2020-09-06 (×4): qty 1

## 2020-09-06 MED ORDER — HYDRALAZINE HCL 20 MG/ML IJ SOLN: 10.0000 mg | Freq: Four times a day (QID) | INTRAMUSCULAR | Status: DC | PRN

## 2020-09-06 NOTE — Progress Notes (Signed)
RT note. Pt. Taken off BIPAP at this time [place]. Pt. Placed on 3L  RN made aware, will try again x1lhr after meds. Been given. RT will continue to monitor.

## 2020-09-06 NOTE — Progress Notes (Signed)
ANTICOAGULATION CONSULT NOTE  Pharmacy Consult for apixaban to lovenox  Indication: atrial fibrillation   Labs: Recent Labs    09/04/20 0259 09/05/20 0418 09/06/20 0342 09/06/20 1238  HGB 7.7* 8.3* 8.6* 9.2*  HCT 26.2* 26.5* 28.1* 27.0*  PLT 153 162 182  --   CREATININE 1.00 0.84 0.97  --      Assessment: 70 yo female presented on 08/23/2020 to [facility] in septic shock requiring intubation. Patient is on apixaban prior to admission for Afib. Was started on heparin then transitioned to subQ heparin given Hgb drift- now back on apixaban. Last dose of apixaban inpatient was 3/6@1016 .   Patient is too drowsy to take PO medications - pharmacy consulted to change to therapeutic enoxaparin. Hgb 8.6, plt 182. No s/sx of bleeding. If remains too drowsy and unable to swallow - plan for possible cortrak tomorrow.  Goal of Therapy:  Monitor platelets by anticoagulation protocol: Yes   Plan:  Stop apixaban  Start enoxaparin 100 mg (1 mg/kg) every 12 hours starting on 3/6@2200  Monitor CBC, renal fx, and for s/sx of bleeding   Sherron Monday, PharmD, BCCCP Clinical Pharmacist  Phone: [phone] 09/06/2020 5:44 PM  Please check AMION for all [facility] Pharm[facility] phone numbers After 10:00 PM, call Main Pharmacy [phone]

## 2020-09-06 NOTE — Progress Notes (Signed)
PROGRESS NOTE    [name]  QQI:[id number] DOB: August 25, 1950 DOA: 08/23/2020 PCP: Lemmie Evens, MD    No chief complaint on file.   Brief Narrative:  70 y/o female admitted to [facility] on 20 February in the setting of septic shock from pneumonia requiring intubation, She had atrial fibrillation at baseline and had been on amiodarone, this was held for concern of possible amiodarone induced lung toxicity and she was treated with steroids. She was extubated on February 25. However, her mental status declined overnight from February 26 through early February 27.  A blood gas was obtained which did not show hypercarbia.  However after the blood gas was performed oxygenation worsened and her mental status did not improve.  In consultation with the pulmonary critical care service in [place] it was decided that she should be electively intubated and then transferred to our campus for a bronchoscopy.   S/p bronchoscopy for mucus plugging, she finished abx treatment, extubated on 3/4, transferred to hospitalist service on 3/6  She is found to be extremely drowsy, difficult to arouse, does not follow command  Past Medical History:  Anemia Barrett's esophagus Systolic heart failure> LVEF 15 to 20% based on recent echocardiogram from [facility]  Diabetes mellitus type 2 Atrial fibrillation Hyperlipidemia Hypertension Seizure disorder Hypothyroidism  Significant Hospital Events:  2/20 Admitted to [facility], intubated 2/22 Stop amiodarone (esr 95) 2/23 Solumedrol trial @ 80 q 12 and reduced to 40 mg q 12h Pm 2/25  2/25 Extubated 2/26 Worsening mental status/respiratory failure 2/27 Transferred to [facility], reintubated, bronched 2/28 Decreasing vent settings 3/01 Failed vent wean 2/2 increased HR/RR 3/4 extubated , bipap q hs  Micro Data:  2/20 COVID >> negative 2/20 Flu PCR >> negative 2/20 MRSA PCR >> negative 2/22 Resp Cx >>No wbc, few gpc >> abundant e. faecalis 2/22 BC x 2 (while  on cefepime) >>1/2 pos staph epi 2/22 Urine strep >>neg 2/22 Urine legionella >>neg 2/27 Resp Cx >> rare candida   Subjective:   She is very drowsy, does not follow commands Daughter at bedside   Assessment & Plan:   Principal Problem:   Lobar pneumonia ([place]) Active Problems:   Hypothyroidism   Hyperlipidemia   Essential hypertension   Atrial fibrillation (HCC)   Congestive heart failure (HCC)   Gastroparesis   Seizure (HCC)   Dementia ([place])   DM type 2 (diabetes mellitus, type 2) ([place])   Acute respiratory failure (HCC)   Respiratory failure with hypoxia (HCC)   Fibromyalgia   Septic shock ([place])   Acute ischemic stroke ([place])   Acute metabolic encephalopathy   Acute metabolic encephalopathy -reports baseline alert and independent -she is very drowsy today, only open eyes briefly after prolonged sternal rub on 3/6 -ABG no Co2 retention, Ct head no acute finding, will get EEG to rule out seizure, she also has mild hypernatremia, hypoactive delirium also a possibility -change meds to IV or subQ, cortack order placed ( it was told they are not available today, but will be available tomorrow) -d/c lasix, she does not appear volume overloaded on exam, she was on tube feed , og tube removed after extubation, she has not had much oral intake since . Monitor sodium level -keep in ICU today    Acute respiratory failure with hypoxemia due to left mainstem mucous plug Recent healthcare associated pneumonia - Finished course of Zosyn 3/2 S/p bronchoscopy 2/27 with total occlusion of L mainstem with mucous plug, sent for Cx. P: --BiPAP nightly -  Pulmonary toilet (hypertonic saline, guaifenesin , chest PT via bed )  Chronic A. fib Rate controlled, amiodarone resumed, no evidence of fibrosis on CT scan Was on heparin drip then apixaban, changed to Lovenox subcu on 3/6 due to acute metabolic encephalopathy not taking p.o. Lopressor changed with IV on 3/6  Chronic diastolic  CHF  Echo 8/75 with EF of 50-55% Currently does not appear volume overloaded with poor oral intake Hold Lasix  Hypokalemia -Replace K, check mag  Hypernatremia Hold Lasix today If not able to take p.o., will need free water flush through core track tube  Insulin-dependent type 2 diabetes Uncontrolled, A1c 7.9 Continue insulin, adjust as needed  CKD 2/anemia of chronic disease Renal function and hemoglobin appear close to baseline Monitor  History of seizures Change oral Keppra to IV Keppra, EEG ordered  Hypothyroidism Resume Synthroid when able to take oral meds  Dementia On Aricept at home, currently not able to take oral meds    Obesity: Body mass index is 36.08 kg/m.Marland Kitchen       Unresulted Labs (From admission, onward)          Start     Ordered   09/07/20 6433  Basic metabolic panel  Daily,   R     Question:  Specimen collection method  Answer:  Lab=Lab collect   09/06/20 2135   09/07/20 0500  Magnesium  Tomorrow morning,   R       Question:  Specimen collection method  Answer:  Lab=Lab collect   09/06/20 2135   09/06/20 1229  Blood gas, arterial  Once,   R        09/06/20 1228   08/31/20 0500  CBC  Daily,   R     Question:  Specimen collection method  Answer:  Unit=Unit collect   08/30/20 2343            DVT prophylaxis: SCDs Start: 08/24/20 0945   Code Status:full Family Communication: daughter at bedside  Disposition:   Status is: Inpatient   Dispo: The patient is from: home              Anticipated d/c is to: TBD              Anticipated d/c date is: Remain in ICU                Consultants:   Pulmonary critical care  Procedures:  ETT 2/20 >>2/25, 2/27 >>3/4, tube feeding while on vent R IJ CVL 2/21 >>  Antimicrobials:   Vancomycin 2/20 (single dose), 2/24 (single dose) Cefepime 2/20 >> 2/24 Zosyn 2/24 >> 3/2    Objective: Vitals:   09/06/20 1600 09/06/20 1700 09/06/20 1800 09/06/20 2000  BP: (!) 154/77 123/78 132/78    Pulse: 94 99 89   Resp: (!) 46 (!) 39 (!) 34   Temp: 98.5 F (36.9 C)   98.4 F (36.9 C)  TempSrc: Oral   Axillary  SpO2: 97% 95% 96%   Weight:      Height:        Intake/Output Summary (Last 24 hours) at 09/06/2020 2135 Last data filed at 09/06/2020 1900 Gross per 24 hour  Intake 314.19 ml  Output 1575 ml  Net -1260.81 ml   Filed Weights   09/04/20 0500 09/05/20 0400 09/06/20 0410  Weight: 105.9 kg 105.7 kg 101.4 kg    Examination:  General exam: obese elderly, chronically ill appearing, very drowsy, open eyes briefly to repeated sternal rub,  does not follow command, does not talk Respiratory system: diminished,  Respiratory effort normal. Cardiovascular system: S1 & S2 heard, RRR.  No pedal edema. Gastrointestinal system: Abdomen is nondistended, soft and nontender. Normal bowel sounds heard. Central nervous system: very drowsy Extremities: occasional spontanoeus  Skin: No rashes, lesions or ulcers Psychiatry: very drowsy    Data Reviewed: I have personally reviewed following labs and imaging studies  CBC: Recent Labs  Lab 09/02/20 0228 09/03/20 0635 09/04/20 0259 09/05/20 0418 09/06/20 0342 09/06/20 1238  WBC 9.7 9.9 11.1* 10.4 8.9  --   HGB 7.1* 7.0* 7.7* 8.3* 8.6* 9.2*  HCT 24.6* 22.7* 26.2* 26.5* 28.1* 27.0*  MCV 109.8* 108.6* 109.2* 107.7* 108.5*  --   PLT 132* 136* 153 162 182  --     Basic Metabolic Panel: Recent Labs  Lab 09/01/20 0419 09/02/20 0228 09/03/20 0308 09/04/20 0259 09/05/20 0418 09/06/20 0342 09/06/20 1238  NA 155* 154* 150* 147* 149* 150* 153*  K 3.2* 3.2* 3.5 3.3* 3.5 3.7 3.4*  CL 121* 123* 120* 115* 115* 113*  --   CO2 _0 --   GLUCOSE 313* 274* 254* 269* 168* 178*  --   BUN 35* 35* 28* 30* 23 27*  --   CREATININE 1.33* 1.19* 1.04* 1.00 0.84 0.97  --   CALCIUM 9.5 9.0 9.1 9.5 9.4 9.4  --   MG 2.1  --   --   --  2.0  --   --   PHOS 3.0  --   --   --  3.5  --   --     GFR: Estimated Creatinine Clearance:  63.9 mL/min (by C-G formula based on SCr of 0.97 mg/dL).  Liver Function Tests: Recent Labs  Lab 08/31/20 0249  AST 31  ALT 29  ALKPHOS 47  BILITOT 0.5  PROT 5.1*  ALBUMIN 2.0*    CBG: Recent Labs  Lab 09/06/20 0347 09/06/20 0745 09/06/20 1210 09/06/20 1603 09/06/20 2000  GLUCAP 152* 144* 149* 153* 159*     Recent Results (from the past 240 hour(s))  Culture, Urine     Status: Abnormal   Collection Time: 08/30/20  8:40 AM   Specimen: Urine, Clean Catch  Result Value Ref Range Status   Specimen Description   Final    URINE, CLEAN CATCH Performed at [facility], [address], [place], [place] [address]    Special Requests   Final    NONE Performed at [facility], [address], [place], [place] [address]    Culture 80,000 COLONIES/mL YEAST (A)  Final   Report Status 08/31/2020 FINAL  Final  Culture, Respiratory w Gram Stain     Status: None   Collection Time: 08/30/20  4:09 PM   Specimen: Tracheal Aspirate; Respiratory  Result Value Ref Range Status   Specimen Description TRACHEAL ASPIRATE  Final   Special Requests NONE  Final   Gram Stain   Final    MODERATE WBC PRESENT, PREDOMINANTLY PMN NO ORGANISMS SEEN Performed at [facility], [address], [place], [place] [address]    Culture RARE CANDIDA DUBLINIENSIS  Final   Report Status 09/03/2020 FINAL  Final         Radiology Studies: CT HEAD WO CONTRAST  Result Date: 09/06/2020 CLINICAL DATA:  Change in mental status EXAM: CT HEAD WITHOUT CONTRAST TECHNIQUE: Contiguous axial images were obtained from the base of the skull through the vertex without intravenous contrast. COMPARISON:  08/30/2020 FINDINGS: Brain: There is no acute intracranial hemorrhage, mass effect, or edema. Gray-white differentiation is preserved. There is no extra-axial fluid collection. Prominence of the ventricles and sulci reflects stable parenchymal volume loss. Patchy hypoattenuation in the supratentorial white matter  likely reflects stable chronic microvascular ischemic changes. Vascular: There is atherosclerotic calcification at the skull base. Skull: Calvarium is unremarkable. Sinuses/Orbits: No acute finding. Other: Bilateral mastoid effusions. IMPRESSION: No acute intracranial abnormality. No significant change since 08/30/2020. Electronically Signed   By: Macy Mis M.D.   On: 09/06/2020 14:17   DG Chest Port 1 View  Result Date: 09/05/2020 CLINICAL DATA:  Acute respiratory failure EXAM: PORTABLE CHEST 1 VIEW COMPARISON:  09/03/2020 FINDINGS: Interval extubation. Nasogastric tube removed. Right internal jugular central venous catheter is unchanged with its tip within the superior right atrium. Pulmonary insufflation has decreased significantly with resultant right basilar atelectasis and vascular crowding at the hila. Retrocardiac opacification persists, atelectasis or infiltrate within this region. Cardiac size is mildly enlarged. No pneumothorax. Small left pleural effusion is difficult to exclude. IMPRESSION: Interval extubation with marked interval decrease in pulmonary insufflation. Persistent retrocardiac opacification compatible with atelectasis or infiltrate within this region. Electronically Signed   By: Fidela Salisbury MD   On: 09/05/2020 07:02        Scheduled Meds: . amiodarone  200 mg Oral Daily  . aspirin  81 mg Oral Daily  . chlorhexidine  15 mL Mouth Rinse BID  . Chlorhexidine Gluconate Cloth  6 each Topical Daily  . donepezil  5 mg Oral QHS  . enoxaparin (LOVENOX) injection  100 mg Subcutaneous Q12H  . guaiFENesin  5 mL Oral Q6H  . insulin aspart  0-20 Units Subcutaneous Q4H  . insulin glargine  25 Units Subcutaneous BID  . levothyroxine  175 mcg Oral Q0600  . mouth rinse  15 mL Mouth Rinse q12n4p  . metoprolol tartrate  2.5 mg Intravenous Q6H  . potassium chloride  20 mEq Oral Daily  . sodium chloride flush  10-40 mL Intracatheter Q12H   Continuous Infusions: . sodium chloride  Stopped (09/06/20 1228)  . levETIRAcetam Stopped (09/06/20 1838)     LOS: 13 days   Time spent: 35 mins Greater than 50% of this time was spent in counseling, explanation of diagnosis, planning of further management, and coordination of care.   Voice Recognition Viviann Spare dictation system was used to create this note, attempts have been made to correct errors. Please contact the author with questions and/or clarifications.   Florencia Reasons, MD PhD FACP [facility]  Available via Epic secure chat 7am-7pm for nonurgent issues Please page for urgent issues To page the attending provider between 7A-7P or the covering provider during after hours 7P-7A, please log into the web site [email or web address] and access using universal [place] password for that web site. If you do not have the password, please call the hospital operator.    09/06/2020, 9:35 PM

## 2020-09-07 ENCOUNTER — Inpatient Hospital Stay (HOSPITAL_COMMUNITY): Payer: PPO

## 2020-09-07 DIAGNOSIS — G9341 Metabolic encephalopathy: Secondary | ICD-10-CM | POA: Diagnosis not present

## 2020-09-07 DIAGNOSIS — J181 Lobar pneumonia, unspecified organism: Secondary | ICD-10-CM | POA: Diagnosis not present

## 2020-09-07 LAB — GLUCOSE, CAPILLARY
Glucose-Capillary: 102 mg/dL — ABNORMAL HIGH (ref 70–99)
Glucose-Capillary: 112 mg/dL — ABNORMAL HIGH (ref 70–99)
Glucose-Capillary: 133 mg/dL — ABNORMAL HIGH (ref 70–99)
Glucose-Capillary: 163 mg/dL — ABNORMAL HIGH (ref 70–99)
Glucose-Capillary: 193 mg/dL — ABNORMAL HIGH (ref 70–99)
Glucose-Capillary: 96 mg/dL (ref 70–99)

## 2020-09-07 LAB — BASIC METABOLIC PANEL
Anion gap: 11 (ref 5–15)
BUN: 28 mg/dL — ABNORMAL HIGH (ref 8–23)
CO2: 23 mmol/L (ref 22–32)
Calcium: 9.1 mg/dL (ref 8.9–10.3)
Chloride: 116 mmol/L — ABNORMAL HIGH (ref 98–111)
Creatinine, Ser: 1.17 mg/dL — ABNORMAL HIGH (ref 0.44–1.00)
GFR, Estimated: 50 mL/min — ABNORMAL LOW (ref >60)
Glucose, Bld: 144 mg/dL — ABNORMAL HIGH (ref 70–99)
Potassium: 4 mmol/L (ref 3.5–5.1)
Sodium: 150 mmol/L — ABNORMAL HIGH (ref 135–145)

## 2020-09-07 LAB — CBC
HCT: 29.4 % — ABNORMAL LOW (ref 36.0–46.0)
Hemoglobin: 8.6 g/dL — ABNORMAL LOW (ref 12.0–15.0)
MCH: 32.1 pg (ref 26.0–34.0)
MCHC: 29.3 g/dL — ABNORMAL LOW (ref 30.0–36.0)
MCV: 109.7 fL — ABNORMAL HIGH (ref 80.0–100.0)
Platelets: 176 10*3/uL (ref 150–400)
RBC: 2.68 MIL/uL — ABNORMAL LOW (ref 3.87–5.11)
RDW: 18 % — ABNORMAL HIGH (ref 11.5–15.5)
WBC: 8.4 10*3/uL (ref 4.0–10.5)
nRBC: 0.9 % — ABNORMAL HIGH (ref 0.0–0.2)

## 2020-09-07 LAB — MAGNESIUM: Magnesium: 2.1 mg/dL (ref 1.7–2.4)

## 2020-09-07 MED ORDER — AMIODARONE HCL 200 MG PO TABS
200.0000 mg | ORAL_TABLET | Freq: Every day | ORAL | Status: DC
  Administered 2020-09-08 – 2020-09-22 (×15): 200 mg
  Filled 2020-09-07 (×16): qty 1

## 2020-09-07 MED ORDER — ASPIRIN 81 MG PO CHEW
81.0000 mg | CHEWABLE_TABLET | Freq: Every day | ORAL | Status: DC
  Administered 2020-09-08 – 2020-09-21 (×14): 81 mg
  Filled 2020-09-07 (×15): qty 1

## 2020-09-07 MED ORDER — FREE WATER
200.0000 mL | Status: DC
  Administered 2020-09-07 – 2020-09-08 (×7): 200 mL

## 2020-09-07 MED ORDER — PROSOURCE TF PO LIQD
45.0000 mL | Freq: Two times a day (BID) | ORAL | Status: DC
  Administered 2020-09-07 – 2020-09-16 (×18): 45 mL
  Filled 2020-09-07 (×20): qty 45

## 2020-09-07 MED ORDER — POTASSIUM CHLORIDE 20 MEQ PO PACK: 20.0000 meq | PACK | Freq: Every day | ORAL | Status: DC

## 2020-09-07 MED ORDER — LEVOTHYROXINE SODIUM 75 MCG PO TABS
175.0000 ug | ORAL_TABLET | Freq: Every day | ORAL | Status: DC
  Administered 2020-09-08 – 2020-09-22 (×15): 175 ug
  Filled 2020-09-07 (×15): qty 1

## 2020-09-07 MED ORDER — ACETAMINOPHEN 650 MG RE SUPP: 650.0000 mg | Freq: Four times a day (QID) | RECTAL | Status: DC | PRN

## 2020-09-07 MED ORDER — VITAL AF 1.2 CAL PO LIQD
1000.0000 mL | ORAL | Status: DC
  Administered 2020-09-07: 1000 mL

## 2020-09-07 MED ORDER — GUAIFENESIN 100 MG/5ML PO SOLN
5.0000 mL | Freq: Four times a day (QID) | ORAL | Status: DC
  Administered 2020-09-07 – 2020-09-23 (×60): 100 mg
  Filled 2020-09-07 (×23): qty 5
  Filled 2020-09-07: qty 25
  Filled 2020-09-07 (×2): qty 5
  Filled 2020-09-07: qty 25
  Filled 2020-09-07 (×33): qty 5

## 2020-09-07 MED ORDER — OSMOLITE 1.2 CAL PO LIQD
1000.0000 mL | ORAL | Status: DC
  Administered 2020-09-07 – 2020-09-14 (×9): 1000 mL
  Filled 2020-09-07 (×18): qty 1000

## 2020-09-07 MED ORDER — ACETAMINOPHEN 325 MG PO TABS
650.0000 mg | ORAL_TABLET | Freq: Four times a day (QID) | ORAL | Status: DC | PRN
  Administered 2020-09-07 – 2020-09-23 (×44): 650 mg
  Filled 2020-09-07 (×44): qty 2

## 2020-09-07 MED ORDER — METOPROLOL TARTRATE 25 MG/10 ML ORAL SUSPENSION
12.5000 mg | Freq: Two times a day (BID) | ORAL | Status: DC
  Administered 2020-09-07 – 2020-09-21 (×29): 12.5 mg
  Filled 2020-09-07: qty 5
  Filled 2020-09-07: qty 10
  Filled 2020-09-07: qty 5
  Filled 2020-09-07: qty 10
  Filled 2020-09-07: qty 5
  Filled 2020-09-07 (×3): qty 10
  Filled 2020-09-07: qty 5
  Filled 2020-09-07: qty 10
  Filled 2020-09-07: qty 5
  Filled 2020-09-07 (×3): qty 10
  Filled 2020-09-07 (×2): qty 5
  Filled 2020-09-07: qty 10
  Filled 2020-09-07 (×2): qty 5
  Filled 2020-09-07: qty 10
  Filled 2020-09-07 (×5): qty 5
  Filled 2020-09-07 (×2): qty 10
  Filled 2020-09-07 (×2): qty 5

## 2020-09-07 MED ORDER — LOPERAMIDE HCL 1 MG/7.5ML PO SUSP
1.0000 mg | Freq: Two times a day (BID) | ORAL | Status: DC | PRN
  Administered 2020-09-07 – 2020-09-21 (×7): 1 mg
  Filled 2020-09-07 (×10): qty 7.5

## 2020-09-07 MED ORDER — DONEPEZIL HCL 5 MG PO TABS
5.0000 mg | ORAL_TABLET | Freq: Every day | ORAL | Status: DC
  Administered 2020-09-07 – 2020-09-22 (×16): 5 mg
  Filled 2020-09-07 (×17): qty 1

## 2020-09-07 MED ORDER — LEVETIRACETAM 100 MG/ML PO SOLN
1000.0000 mg | Freq: Two times a day (BID) | ORAL | Status: DC
  Administered 2020-09-07 – 2020-09-22 (×31): 1000 mg
  Filled 2020-09-07 (×32): qty 10

## 2020-09-07 NOTE — Progress Notes (Signed)
CPT not done at this time. Patient is sleeping. 

## 2020-09-07 NOTE — Procedures (Signed)
Cortrak  Person Inserting Tube:  Jemiah Cuadra C, RD Tube Type:  Cortrak - 43 inches Tube Location:  Left nare Initial Placement:  Stomach Secured by: Bridle Technique Used to Measure Tube Placement:  Documented cm marking at nare/ corner of mouth Cortrak Secured At:  65 cm    Cortrak Tube Team Note:  Consult received to place a Cortrak feeding tube.   No x-ray is required. RN may begin using tube.    If the tube becomes dislodged please keep the tube and contact the Cortrak team at [email or web address] (password [email or web address]) for replacement.  If after hours and replacement cannot be delayed, place a NG tube and confirm placement with an abdominal x-ray.    Arleene Settle P., RD, LDN, CNSC See AMiON for contact information    

## 2020-09-07 NOTE — Procedures (Signed)
Patient Name: [name]  MRN: [id number]  Epilepsy Attending: Charlsie Quest  Referring Physician/Provider: Dr. Albertine Grates Date: 09/07/2020 Duration: 25.55 mins  Patient history: 70 year old female with altered mental status. EEG to evaluate for seizures.  Level of alertness: Awake  AEDs during EEG study: Keppra  Technical aspects: This EEG study was done with scalp electrodes positioned according to the 10-20 International system of electrode placement. Electrical activity was acquired at a sampling rate of 500Hz  and reviewed with a high frequency filter of 70Hz  and a low frequency filter of 1Hz . EEG data were recorded continuously and digitally stored.   Description: The posterior dominant rhythm consists of 7 Hz activity of moderate voltage (25-35 uV) seen predominantly in posterior head regions, symmetric and reactive to eye opening and eye closing. EEG showed continuous generalized 3 to 6 Hz theta-delta slowing. Hyperventilation and photic stimulation were not performed.     ABNORMALITY -Continuous slow, generalized  IMPRESSION: This study is suggestive of mild to moderate diffuse encephalopathy, nonspecific etiology. No seizures or epileptiform discharges were seen throughout the recording.  [name] 

## 2020-09-07 NOTE — Progress Notes (Signed)
PT Cancellation Note  Patient Details Name: [name] MRN: [id number] DOB: January 25, 1951   Cancelled Treatment:    Reason Eval/Treat Not Completed: Fatigue/lethargy limiting ability to participate  Family members present and stated pt had just fallen asleep and they requested not to wake her as she had a busy morning. Pt not arousing during conversation and deferred at family's request.   Jerolyn Center, PT Pager [phone]   Scherrie November [name] 09/07/2020, 2:49 PM

## 2020-09-07 NOTE — Progress Notes (Addendum)
SLP Cancellation Note  Patient Details Name: [name] MRN: [id number] DOB: 1951/06/08   Cancelled treatment:       Reason Eval/Treat Not Completed: Medical issues which prohibited therapy.   09:00 - SLP follow up to determine readiness for PO intake. RN reports pt is currently on BiPAP, and is sleeping. Will continue efforts once pt tolerates being off BiPAP.  10:25 - RN reports pt continues to be lethargic. Cortrak now in place. Will continue efforts to assess PO readiness when pt appropriate.   Celia B. Murvin Natal, [facility], CCC-SLP Speech Language Pathologist Office: [phone]  Leigh Aurora 09/07/2020, 9:02 AM, 10:25 AM

## 2020-09-07 NOTE — Progress Notes (Signed)
EEG complete - results pending 

## 2020-09-07 NOTE — Progress Notes (Addendum)
Nutrition Follow-up  DOCUMENTATION CODES:   Obesity unspecified  INTERVENTION:   Initiate tube feeding via Cortrak: Osmolite 1.2 at 60 ml/h (1440 ml per day) Prosource TF 45 ml BID  Provides 1728 kcal, 102 gm protein, 1181 ml free water daily   NUTRITION DIAGNOSIS:   Inadequate oral intake related to inability to eat as evidenced by NPO status.  Ongoing.  GOAL:   Patient will meet greater than or equal to 90% of their needs  Progressing.  MONITOR:   PO intake,Vent status,Labs,Weight trends,Skin,TF tolerance  REASON FOR ASSESSMENT:   Consult Enteral/tube feeding initiation and management  ASSESSMENT:   Patient presents with septic shock, pneumonia, acute respiratory failure, acute kidney injury on CKD-3a, DM-2 and obesity. Hx of diastolic heart failure, chronic atrial fibrillation.  3/01 - failed vent wean 2/2 increased HR/RR  3/03 - off sedation 3/04 - extubated, bipap q hs 3/05 - SLP eval, recommends NPO except ice chips 3/07 - Cortrak placed  Spoke with pt's daughter and husband at bedside. Pt was nonverbal and unable to provide any information. [name] reported that pt has been experiencing chronic diarrhea, however has not been experiencing any other GI symptoms. [name] was eager for her mother to get started on nutrition as soon as possible.   Meds reviewed: Keppra (BID), Imodium Labs reviewed: Sodium (150, high), CBG (96-159)  Admit weight: 229.28#  Current weight: 221.12#  Pt's weights reviewed and show that pt has lost weight during her admission likely due to fluid. [name] reports her mother's UBW is 225#.   UOP: 1,725 mL x 24 hrs I/O's: +1,569 L since admit  Diet Order:   Diet Order            Diet NPO time specified Except for: Other (See Comments)  Diet effective now                 EDUCATION NEEDS:   Not appropriate for education at this time  Skin:  Skin Assessment: Skin Integrity Issues: Skin Integrity Issues::  DTI DTI: bilateral heels  Last BM:  3/07 (type 7)  Height:   Ht Readings from Last 1 Encounters:  08/30/20 5\' 6"  (1.676 m)    Weight:   Wt Readings from Last 1 Encounters:  09/07/20 100.3 kg    Ideal Body Weight:  59 kg  BMI:  Body mass index is 35.69 kg/m.  Estimated Nutritional Needs:   Kcal:  1700-1900  Protein:  100-120 g  Fluid:  >/=1.7 L    11/07/20, Dietetic Intern 09/07/2020 3:21 PM

## 2020-09-07 NOTE — Progress Notes (Signed)
Appropriate Use Committee Chart Review  Chart reviewed by the physician advisor with input from [facility] and the attending MD as needed for review of the appropriateness for SNF referral.  TOC notes, PT/OT/ST notes, nursing notes and physician notes reviewed for medical necessity to determine if the patient's needs are appropriate for short-term rehab to return to a prior level of function versus the likely need for custodial care.  At this time, the patient meets Medicare criteria for SNF placement.    Recommendations: The patient is SNF appropriate for short-term rehab/skilled nursing interventions, depending on clinical course.  A consult to the Transitions of Care Team has been made to facilitate placement.    Hillery Aldo, MD Chief Physician Advisor  09/07/2020 3:15 PM

## 2020-09-07 NOTE — Progress Notes (Addendum)
PROGRESS NOTE    [name]  NIO:[id number] DOB: 1951/02/01 DOA: 08/23/2020 PCP: Lemmie Evens, MD    Brief Narrative: This 85 y/ofemale admitted to [facility] on 08/23/20 in the setting of septic shock from pneumonia requiring intubation, She had atrial fibrillation at baseline and had been on amiodarone, this was held for concerns of possible amiodarone induced lung toxicity and she was treated with steroids. She was extubated on 08/28/20. However, her mental status declined overnight from 2/26 through early 2/27. A blood gas was obtained which did not show hypercarbia. However after the blood gas was performed patient has worsening of hypoxia and mental status. In consultation with the pulmonary critical care service in [place] it was decided that she should be electively intubated and then transferred to [facility] for  bronchoscopy.  She is s/p bronchoscopy for mucus plugging, she finished abx treatment, then extubated on 3/4, transferred to hospitalist service on 3/6.  She appears to be extremely drowsy, does not follow command, opens eyes on calling her name.  Significant Hospital Events:  2/20 Admitted to [facility], intubated 2/22 Stop amiodarone (esr 95) 2/23 Solumedrol trial @ 80 q 12 and reduced to 40 mg q 12h Pm 2/25  2/25 Extubated 2/26 Worsening mental status/respiratory failure 2/27 Transferred to [facility], reintubated, bronched 2/28 Decreasing vent settings 3/01 Failed vent wean 2/2 increased HR/RR 3/4 extubated , bipap q hs  Assessment & Plan:   Principal Problem:   Lobar pneumonia ([place]) Active Problems:   Hypothyroidism   Hyperlipidemia   Essential hypertension   Atrial fibrillation (HCC)   Congestive heart failure (HCC)   Gastroparesis   Seizure ([place])   Dementia ([place])   DM type 2 (diabetes mellitus, type 2) ([place])   Acute respiratory failure (HCC)   Respiratory failure with hypoxia ([place])   Fibromyalgia   Septic shock ([place])   Acute ischemic stroke ([place])    Acute metabolic encephalopathy  Acute metabolic encephalopathy :  -Daughter reports at baseline, she is alert and independent. -She has been very drowsy , open eyes after calling her name today, then back to sleep. -ABG showed no PCo2 retention, CT head no acute finding, EEG completed to r/o seizures,report pending. There is  mild hypernatremia, Delirium could be possibility.  Obtain UA. - Cortack inserted for nutrition. changed meds to IV or subQ,  -Monitor sodium level. -Neuro checks every 4 hours.  Acute hypoxic respiratory failure due to left mainstem mucous pluging Recent healthcare associated pneumonia  She has recently finished course of antibiotics Zosyn on 3/2 S/p bronchoscopy on 2/27 with total occlusion of L mainstem with mucous plug, sent for Cx. Continue BiPAP nightly and as needed. Continue Pulmonary toilet (hypertonic saline, guaifenesin,chest PT via bed )  Chronic A. Fib : Her heart rate remains controlled, amiodarone been resumed, no evidence of fibrosis on CT scan. She was on heparin drip then apixaban, changed to Lovenox subcu on 3/6 due to acute metabolic encephalopathy , she is not taking PO , Lopressor changed with IV on 3/6  Chronic diastolic CHF  Echo 8/18 with EF of 50-55%. Currently does not appear volume overloaded with poor oral intake. Hold Lasix  Hypokalemia : Improved. -Replace K if needed.  Hypernatremia Hold Lasix today. Na 150 will need free water flush through core track tube if not able to take PO.  Insulin-dependent type 2 diabetes Uncontrolled, A1c 7.9 Continue insulin SS, adjust as needed.  CKD 2/anemia of chronic disease Renal function and hemoglobin appear close to baseline.  Monitor  Renal functions  History of seizures Change oral Keppra to IV Keppra, EEG completed, report pending  Hypothyroidism Resume Synthroid when able to take oral meds  Dementia On Aricept at home, currently not able to take oral  meds.   Obesity: Body mass index is 36.08 kg/m. outpatient PCP follow up.   DVT prophylaxis: SCDs Code Status: Full code Family Communication:Spoke with Daughter Disposition Plan:   Status is: Inpatient  Remains inpatient appropriate because:Inpatient level of care appropriate due to severity of illness  Dispo: The patient is from: Home              Anticipated d/c is to: TBD              Patient currently is not medically stable to d/c.   Difficult to place patient No  Consultants:   Pulm/ Critical care.  Procedures:  ETT 2/20 >>2/25, 2/27 >>3/4, tube feeding while on vent R IJ CVL 2/21 >>   Antimicrobials:  Anti-infectives (From admission, onward)   Start     Dose/Rate Route Frequency Ordered Stop   08/28/20 1900  vancomycin (VANCOREADY) IVPB 1250 mg/250 mL  Status:  Discontinued        1,250 mg 166.7 mL/hr over 90 Minutes Intravenous Every 24 hours 08/27/20 1820 08/28/20 1204   08/27/20 2200  piperacillin-tazobactam (ZOSYN) IVPB 3.375 g        3.375 g 12.5 mL/hr over 240 Minutes Intravenous Every 8 hours 08/27/20 1412 09/03/20 0141   08/27/20 1915  vancomycin (VANCOREADY) IVPB 2000 mg/400 mL        2,000 mg 200 mL/hr over 120 Minutes Intravenous  Once 08/27/20 1816 08/27/20 2156   08/27/20 1430  piperacillin-tazobactam (ZOSYN) IVPB 3.375 g        3.375 g 100 mL/hr over 30 Minutes Intravenous  Once 08/27/20 1412 08/27/20 1502   08/27/20 1000  ceFEPIme (MAXIPIME) 2 g in sodium chloride 0.9 % 100 mL IVPB  Status:  Discontinued        2 g 200 mL/hr over 30 Minutes Intravenous Every 12 hours 08/27/20 0744 08/27/20 1412   08/26/20 1000  ceFEPIme (MAXIPIME) 2 g in sodium chloride 0.9 % 100 mL IVPB  Status:  Discontinued        2 g 200 mL/hr over 30 Minutes Intravenous Every 8 hours 08/26/20 0848 08/27/20 0744   08/25/20 1200  vancomycin (VANCOCIN) IVPB 1000 mg/200 mL premix  Status:  Discontinued        1,000 mg 200 mL/hr over 60 Minutes Intravenous Every 24  hours 08/24/20 1043 08/24/20 1452   08/24/20 1200  vancomycin (VANCOREADY) IVPB 2000 mg/400 mL  Status:  Discontinued        2,000 mg 200 mL/hr over 120 Minutes Intravenous  Once 08/24/20 1009 08/24/20 1452   08/24/20 1045  ceFEPIme (MAXIPIME) 2 g in sodium chloride 0.9 % 100 mL IVPB  Status:  Discontinued        2 g 200 mL/hr over 30 Minutes Intravenous Every 12 hours 08/24/20 0958 08/26/20 0848   08/24/20 0100  ceFEPIme (MAXIPIME) 2 g in sodium chloride 0.9 % 100 mL IVPB        2 g 200 mL/hr over 30 Minutes Intravenous  Once 08/24/20 0047 08/24/20 0219       Subjective: Patient was seen and examined at bedside.  Overnight events noted.  Patient seems very lethargic.  Not following commands, opens eyes on calling her name.  EEG completed,  report is  pending.  Daughter is at bedside,  all questions answered.  Objective: Vitals:   09/07/20 0742 09/07/20 0800 09/07/20 0900 09/07/20 1000  BP:  102/60 100/63 118/84  Pulse:  76 93 (!) 107  Resp:  '17 19 19  ' Temp: 98.4 F (36.9 C)     TempSrc: Axillary     SpO2:  100% 100% 95%  Weight:      Height:        Intake/Output Summary (Last 24 hours) at 09/07/2020 1105 Last data filed at 09/07/2020 0800 Gross per 24 hour  Intake 224.82 ml  Output 1550 ml  Net -1325.18 ml   Filed Weights   09/05/20 0400 09/06/20 0410 09/07/20 0351  Weight: 105.7 kg 101.4 kg 100.3 kg    Examination:  General exam: Appears calm and comfortable, Drowsy, opens eyes on calling name.  Respiratory system: Clear to auscultation. Respiratory effort normal. Cardiovascular system: S1 & S2 heard, RRR. No JVD, murmurs, rubs, gallops or clicks. No pedal edema. Gastrointestinal system: Abdomen is nondistended, soft and nontender. No organomegaly or masses felt. Normal bowel sounds heard. Central nervous system: Drowsy,  Not assessed. Extremities:  No edema, no cyanosis, no clubbing Skin: No rashes, lesions or ulcers Psychiatry: Judgement and insight appear normal.  Mood & affect appropriate.     Data Reviewed: I have personally reviewed following labs and imaging studies  CBC: Recent Labs  Lab 09/03/20 0635 09/04/20 0259 09/05/20 0418 09/06/20 0342 09/06/20 1238 09/07/20 0114  WBC 9.9 11.1* 10.4 8.9  --  8.4  HGB 7.0* 7.7* 8.3* 8.6* 9.2* 8.6*  HCT 22.7* 26.2* 26.5* 28.1* 27.0* 29.4*  MCV 108.6* 109.2* 107.7* 108.5*  --  109.7*  PLT 136* 153 162 182  --  614   Basic Metabolic Panel: Recent Labs  Lab 09/01/20 0419 09/02/20 0228 09/03/20 0308 09/04/20 0259 09/05/20 0418 09/06/20 0342 09/06/20 1238 09/07/20 0114  NA 155*   < > 150* 147* 149* 150* 153* 150*  K 3.2*   < > 3.5 3.3* 3.5 3.7 3.4* 4.0  CL 121*   < > 120* 115* 115* 113*  --  116*  CO2 22   < > '23 23 24 23  ' --  23  GLUCOSE 313*   < > 254* 269* 168* 178*  --  144*  BUN 35*   < > 28* 30* 23 27*  --  28*  CREATININE 1.33*   < > 1.04* 1.00 0.84 0.97  --  1.17*  CALCIUM 9.5   < > 9.1 9.5 9.4 9.4  --  9.1  MG 2.1  --   --   --  2.0  --   --  2.1  PHOS 3.0  --   --   --  3.5  --   --   --    < > = values in this interval not displayed.   GFR: Estimated Creatinine Clearance: 52.7 mL/min (A) (by C-G formula based on SCr of 1.17 mg/dL (H)). Liver Function Tests: No results for input(s): AST, ALT, ALKPHOS, BILITOT, PROT, ALBUMIN in the last 168 hours. No results for input(s): LIPASE, AMYLASE in the last 168 hours. No results for input(s): AMMONIA in the last 168 hours. Coagulation Profile: No results for input(s): INR, PROTIME in the last 168 hours. Cardiac Enzymes: No results for input(s): CKTOTAL, CKMB, CKMBINDEX, TROPONINI in the last 168 hours. BNP (last 3 results) No results for input(s): PROBNP in the last 8760 hours. HbA1C: No results for input(s): HGBA1C in  the last 72 hours. CBG: Recent Labs  Lab 09/06/20 1603 09/06/20 2000 09/06/20 2329 09/07/20 0326 09/07/20 0741  GLUCAP 153* 159* 133* 112* 96   Lipid Profile: No results for input(s): CHOL, HDL, LDLCALC,  TRIG, CHOLHDL, LDLDIRECT in the last 72 hours. Thyroid Function Tests: No results for input(s): TSH, T4TOTAL, FREET4, T3FREE, THYROIDAB in the last 72 hours. Anemia Panel: No results for input(s): VITAMINB12, FOLATE, FERRITIN, TIBC, IRON, RETICCTPCT in the last 72 hours. Sepsis Labs: No results for input(s): PROCALCITON, LATICACIDVEN in the last 168 hours.  Recent Results (from the past 240 hour(s))  Culture, Urine     Status: Abnormal   Collection Time: 08/30/20  8:40 AM   Specimen: Urine, Clean Catch  Result Value Ref Range Status   Specimen Description   Final    URINE, CLEAN CATCH Performed at [facility], [address], [place], [place] [address]    Special Requests   Final    NONE Performed at [facility], [address], [place], [place] [address]    Culture 80,000 COLONIES/mL YEAST (A)  Final   Report Status 08/31/2020 FINAL  Final  Culture, Respiratory w Gram Stain     Status: None   Collection Time: 08/30/20  4:09 PM   Specimen: Tracheal Aspirate; Respiratory  Result Value Ref Range Status   Specimen Description TRACHEAL ASPIRATE  Final   Special Requests NONE  Final   Gram Stain   Final    MODERATE WBC PRESENT, PREDOMINANTLY PMN NO ORGANISMS SEEN Performed at [facility], [address] [place],  [address]    Culture RARE CANDIDA DUBLINIENSIS  Final   Report Status 09/03/2020 FINAL  Final    Radiology Studies: CT HEAD WO CONTRAST  Result Date: 09/06/2020 CLINICAL DATA:  Change in mental status EXAM: CT HEAD WITHOUT CONTRAST TECHNIQUE: Contiguous axial images were obtained from the base of the skull through the vertex without intravenous contrast. COMPARISON:  08/30/2020 FINDINGS: Brain: There is no acute intracranial hemorrhage, mass effect, or edema. Gray-white differentiation is preserved. There is no extra-axial fluid collection. Prominence of the ventricles and sulci reflects stable parenchymal volume loss. Patchy hypoattenuation in the  supratentorial white matter likely reflects stable chronic microvascular ischemic changes. Vascular: There is atherosclerotic calcification at the skull base. Skull: Calvarium is unremarkable. Sinuses/Orbits: No acute finding. Other: Bilateral mastoid effusions. IMPRESSION: No acute intracranial abnormality. No significant change since 08/30/2020. Electronically Signed   By: Macy Mis M.D.   On: 09/06/2020 14:17    Scheduled Meds: . amiodarone  200 mg Oral Daily  . aspirin  81 mg Oral Daily  . chlorhexidine  15 mL Mouth Rinse BID  . Chlorhexidine Gluconate Cloth  6 each Topical Daily  . donepezil  5 mg Oral QHS  . enoxaparin (LOVENOX) injection  100 mg Subcutaneous Q12H  . guaiFENesin  5 mL Oral Q6H  . insulin aspart  0-20 Units Subcutaneous Q4H  . insulin glargine  25 Units Subcutaneous BID  . levothyroxine  175 mcg Oral Q0600  . mouth rinse  15 mL Mouth Rinse q12n4p  . metoprolol tartrate  2.5 mg Intravenous Q6H  . potassium chloride  20 mEq Oral Daily  . sodium chloride flush  10-40 mL Intracatheter Q12H   Continuous Infusions: . sodium chloride Stopped (09/06/20 1228)  . levETIRAcetam Stopped (09/07/20 0555)     LOS: 14 days    Time spent: 35 mins    Austin Pongratz, MD [facility]   If 7PM-7AM,  please contact night-coverage

## 2020-09-08 ENCOUNTER — Inpatient Hospital Stay (HOSPITAL_COMMUNITY): Payer: PPO

## 2020-09-08 ENCOUNTER — Other Ambulatory Visit: Payer: Self-pay

## 2020-09-08 DIAGNOSIS — G9341 Metabolic encephalopathy: Secondary | ICD-10-CM | POA: Diagnosis not present

## 2020-09-08 DIAGNOSIS — J181 Lobar pneumonia, unspecified organism: Secondary | ICD-10-CM | POA: Diagnosis not present

## 2020-09-08 LAB — CBC
HCT: 28.4 % — ABNORMAL LOW (ref 36.0–46.0)
Hemoglobin: 8.8 g/dL — ABNORMAL LOW (ref 12.0–15.0)
MCH: 33.2 pg (ref 26.0–34.0)
MCHC: 31 g/dL (ref 30.0–36.0)
MCV: 107.2 fL — ABNORMAL HIGH (ref 80.0–100.0)
Platelets: 195 10*3/uL (ref 150–400)
RBC: 2.65 MIL/uL — ABNORMAL LOW (ref 3.87–5.11)
RDW: 18 % — ABNORMAL HIGH (ref 11.5–15.5)
WBC: 7.4 10*3/uL (ref 4.0–10.5)
nRBC: 0.7 % — ABNORMAL HIGH (ref 0.0–0.2)

## 2020-09-08 LAB — COMPREHENSIVE METABOLIC PANEL
ALT: 19 U/L (ref 0–44)
AST: 17 U/L (ref 15–41)
Albumin: 2.7 g/dL — ABNORMAL LOW (ref 3.5–5.0)
Alkaline Phosphatase: 50 U/L (ref 38–126)
Anion gap: 10 (ref 5–15)
BUN: 27 mg/dL — ABNORMAL HIGH (ref 8–23)
CO2: 23 mmol/L (ref 22–32)
Calcium: 9.4 mg/dL (ref 8.9–10.3)
Chloride: 116 mmol/L — ABNORMAL HIGH (ref 98–111)
Creatinine, Ser: 1.1 mg/dL — ABNORMAL HIGH (ref 0.44–1.00)
GFR, Estimated: 54 mL/min — ABNORMAL LOW (ref >60)
Glucose, Bld: 266 mg/dL — ABNORMAL HIGH (ref 70–99)
Potassium: 3 mmol/L — ABNORMAL LOW (ref 3.5–5.1)
Sodium: 149 mmol/L — ABNORMAL HIGH (ref 135–145)
Total Bilirubin: 1 mg/dL (ref 0.3–1.2)
Total Protein: 5.8 g/dL — ABNORMAL LOW (ref 6.5–8.1)

## 2020-09-08 LAB — URINALYSIS, ROUTINE W REFLEX MICROSCOPIC
Bacteria, UA: NONE SEEN
Bilirubin Urine: NEGATIVE
Glucose, UA: NEGATIVE mg/dL
Hgb urine dipstick: NEGATIVE
Ketones, ur: NEGATIVE mg/dL
Leukocytes,Ua: NEGATIVE
Nitrite: NEGATIVE
Protein, ur: 30 mg/dL — AB
Specific Gravity, Urine: 1.023 (ref 1.005–1.030)
pH: 5 (ref 5.0–8.0)

## 2020-09-08 LAB — MAGNESIUM: Magnesium: 2.1 mg/dL (ref 1.7–2.4)

## 2020-09-08 LAB — GLUCOSE, CAPILLARY
Glucose-Capillary: 251 mg/dL — ABNORMAL HIGH (ref 70–99)
Glucose-Capillary: 212 mg/dL — ABNORMAL HIGH (ref 70–99)
Glucose-Capillary: 237 mg/dL — ABNORMAL HIGH (ref 70–99)
Glucose-Capillary: 240 mg/dL — ABNORMAL HIGH (ref 70–99)
Glucose-Capillary: 221 mg/dL — ABNORMAL HIGH (ref 70–99)
Glucose-Capillary: 238 mg/dL — ABNORMAL HIGH (ref 70–99)

## 2020-09-08 LAB — PHOSPHORUS: Phosphorus: 3.5 mg/dL (ref 2.5–4.6)

## 2020-09-08 MED ORDER — POTASSIUM CHLORIDE 10 MEQ/100ML IV SOLN
10.0000 meq | INTRAVENOUS | Status: AC
  Administered 2020-09-08 (×4): 10 meq via INTRAVENOUS
  Filled 2020-09-08 (×4): qty 100

## 2020-09-08 MED ORDER — POTASSIUM CHLORIDE 20 MEQ PO PACK
20.0000 meq | PACK | ORAL | Status: AC
  Administered 2020-09-08 (×2): 20 meq
  Filled 2020-09-08 (×2): qty 1

## 2020-09-08 MED ORDER — APIXABAN 5 MG PO TABS
5.0000 mg | ORAL_TABLET | Freq: Two times a day (BID) | ORAL | Status: DC
  Administered 2020-09-08 – 2020-09-22 (×30): 5 mg
  Filled 2020-09-08 (×29): qty 1

## 2020-09-08 MED ORDER — INSULIN ASPART 100 UNIT/ML ~~LOC~~ SOLN
3.0000 [IU] | SUBCUTANEOUS | Status: DC
  Administered 2020-09-08 – 2020-09-09 (×6): 3 [IU] via SUBCUTANEOUS

## 2020-09-08 MED ORDER — FREE WATER
250.0000 mL | Status: DC
  Administered 2020-09-08 – 2020-09-10 (×10): 250 mL

## 2020-09-08 MED ORDER — APIXABAN 5 MG PO TABS: 5.0000 mg | ORAL_TABLET | Freq: Two times a day (BID) | ORAL | Status: DC

## 2020-09-08 NOTE — Progress Notes (Signed)
[facility] ADULT ICU REPLACEMENT PROTOCOL   The patient does apply for the [facility] Adult ICU Electrolyte Replacment Protocol based on the criteria listed below:   1. Is GFR >/= 30 ml/min? Yes.    Patient's GFR today is 54 2. Is SCr </= 2? Yes.   Patient's SCr is 1.10 ml/kg/hr 3. Did SCr increase >/= 0.5 in 24 hours? No. 4. Abnormal electrolyte(s): K 3.0 5. Ordered repletion with: *protocol 6. If a panic level lab has been reported, has the CCM MD in charge been notified? Yes.  .   Physician:  Shawn Stall R Romelia Bromell 09/08/2020 5:40 AM

## 2020-09-08 NOTE — Progress Notes (Signed)
Speech Language Pathology Treatment: Dysphagia  Patient Details Name: [name] MRN: [id number] DOB: 04-Jul-1951 Today's Date: 09/08/2020 Time: 2025-4270 SLP Time Calculation (min) (ACUTE ONLY): 30 min  Assessment / Plan / Recommendation Clinical Impression  Pt was seen for skilled ST intervention to assess readiness for po intake. Daughter was present during this session. RN and SLP repositioned pt and seated upright on left side. Oral care was completed with suction. Pt was noted to orally manipulate the swab. Oral cavity appears pink, moist, and healthy. Pt is edentulous upper and has lower dentition. Daughter indicated pt has upper dentures. Pt was unable to demonstrate ability to follow directions, and was nonvocal throughout this session.  Following oral care, pt accepted small individual ice chips x2. Minimal oral manipulation was observed, with absent swallow reflex after 1 minute. Oral cavity was suctioned to remove any remaining water. No vocalization was elicited, and no cough response observed.   Basic swallow function was explained to pt's daughter, including role of swallow reflex to close airway and relax UES, as well as likelihood of aspiration in the event of absent swallow reflex. The importance of oral care was also discussed - if oral care has not been completed recently and pt aspirates water/ice/secretions, she is also aspirating the bacteria in her oral cavity, increasing risk for infection. SLP encouraged avoiding giving pt ice chips if she has not had oral care, and is not fully alert and responsive.  RN was also informed. At this time, continue to recommend strict NPO, regular thorough oral care, and provision of ice chips only when pt is fully awake and alert immediately following oral care. Oral care and ice chip guidelines posted at [facility]. SLP will continue to follow for PO readiness.   HPI HPI: 70 y/o female admitted to [facility] on 20 February in the setting  of septic shock from pneumonia requiring intubation, extubated on February 25.  Transferred to [facility] on February 27 in the setting of worsening mental status, worsening respiratory failure due to a left lung mucous plug.  The patient was intubated upon arrival and could not provide further history.  History was obtained from chart review.  She had previously been followed by the hospitalist service as well as critical care medicine.  She had been treated for healthcare associated pneumonia.  Of note, she had a fall at home prior to admission.  She has atrial fibrillation at baseline and has been on amiodarone, after admission this was held for concern of possible amiodarone induced lung toxicity and she was treated with steroids.   In consultation with the pulmonary critical care service in [place] it was decided that she should be electively intubated and then transferred to our campus for a bronchoscopy.  Most recent chest xray is showing interval extubation with marked interval decrease in pulmonary insufflation and persistent retrocardiac opacification consistent with atlectasis within this region.  She is currently on a 4L nasal canula after successful extubation      SLP Plan  Continue with current plan of care       Recommendations  Diet recommendations: NPO Medication Administration: Via alternative means                Oral Care Recommendations: Oral care QID Follow up Recommendations: Other (comment) (TBD) SLP Visit Diagnosis: Dysphagia, unspecified (R13.10) Plan: Continue with current plan of care       GO  Zamyah Wiesman B. Murvin Natal, [facility], CCC-SLP Speech Language Pathologist Office: [phone]  Leigh Aurora 09/08/2020, 9:58 AM

## 2020-09-08 NOTE — Consult Note (Addendum)
Neurology Consultation  Reason for Consult: Persistent AMS Referring Physician: Cipriano Bunker, MD  CC: Patient continues to appear delirious and is no longer feeding herself or eating.  History is obtained from: Daughter and Chart  HPI: [name] is a 70 y.o. female w/ PMH of chronic anemia, Barrett's esophagus, systolic heart failure, type 2 diabetes mellitus, CKD Stage 3a, atrial fibrillation, dyslipidemia, hypertension, seizures hypothyroidism. Patient had also just been recently discharged from [facility] 2/14-18/22, where she underwent cardiac workup for NSTEMI with coronary angiography patient had echo that noted HFrEF w/ EF 15-20%. Patient was dx with cardiomyopathy thought to be 2/2 A fib w/ RVR and uncontrolled HTN. Patient initially presented to [facility] 08/24/20 for "unresposive respiratory failure."  Patient was dx with acute respiratory failure due to multilobar pneumonia, she was intubated and placed broad-spectrum antibiotic and critical care was consulted.Patient was treated for septic shock likely 2/2 PNA w/ cefepime(2/20-2/24) and zosyn (2/24- 09/02/20) w/ Vancomycin 2/20 and 2/24.  Patient was continued on her home Keppra. During hospitalization patient was initially continued on her amiodarone; however it was held 2/22- 3/2, then restarted 3/2- current. a patient was noted to have episodes of bradycardia (w/ Precedex and diprivan on board). Patient was  anticoagulated on Eliquis then Heparin(2/23-3/2, 09/04/20) and Lovenox 3/6-3/7 and restarted ELiquis. 2/25 patient was noted to have a new CVA on CT Head and underwent further stroke workup (detailed below). Patient was extubated 08/28/2020.  2/27 patient noted to have large left lung mucus plug and worsening mental status, with worsening hypoxemia and transferred to [facility]. Mucus plug removed via bronchoscopy 2/27 and sent for cx that grew no organisms.  Patient was successfully extubated 09/04/20, but continued to have altered mental  status.   Regarding stroke workup: Patient MRI brain 2/25 noted new acute/subacute 2mm lacunar infarct in right centrum semiovale. Dr. Gerilyn Pilgrim was consulted on care and recommended that this likely represented small vessel disease and anticoagulation could be continued. ASA x7 days was added. Dr. Wilford Corner recommended 2/26 to hold antiplatelet agents and continue IV heparin. A1c noted 2/21, 7.9.  Lipids notable for LDL 88. Echo was preformed 2/26, and EF was noted to be 50-55% mild MR and mild decrease RV fxn (notably improved from prior as above). Patient underwent carotid US and was not noted to have significant stenosis in the left while unable to assess the right due to her bandaged.  On exam today patient is noted to appear delirious. Patient does not follow commands or participate in exam. Patient is able to open her eyes to verbal stimuli and closes her eyes when asked to open them to assess pupillary reflex. Patient does not respond to noxious stimuli and prefers to lay on her Left side, but will occasionally reposition herself or role onto her back momentarily. Daughter is in the room on examination.  Patient was transferred to [facility] 2/27.  Daughter adds additional history that the patient has longstanding issues with delirium since at least 2018 during hospitalizations.  At home anytime she has even a minor illness she does get quite delirious and confused.  At the same time the patient expressed to her daughter "don't give up on me" before one of her recent hospitalizations and therefore there is sure that the patient would want all aggressive measures. At baseline she watches a lot of TV, news, and is conversant about what she watches.  Her daughter manages her pain medications since an incident in July or August 2021 where  the patient seemed to have gotten confused and took all of her medications for the day at the same time.  Otherwise the patient continues to take prepackaged medications on schedule  IV times a day and there are no concerns about her compliance with her medications at home.   Aricept, Neurontin, Ativan and Cymbalta were OP meds. Patient sees Dr. Brock Ra for her seizures OP.  LKW: Unknown tpa given?: no, not indicated  ROS:  Unable to obtain due to altered mental status.   Past Medical History:  Diagnosis Date  . Abnormal pulmonary function test   . Anemia    H/H of 10/30 with a normal MCV in 12/09  . Anxiety   . Arthritis   . Barrett's esophagus    Diagnosed 1995. Last EGD 2016-NO BARRETT'S.   . Chest pain    Negative cardiac catheterization in 2002; negative stress nuclear study in 2008  . Chronic anticoagulation   . Chronic combined systolic and diastolic CHF (congestive heart failure) (HCC)    a. EF predominantly normal during prior echoes but was 40% during 10/2014 echo. b. Most recent 01/2015 EF normal, 55-60%.  . Chronic LBP    Surgical intervention in 1996  . Diabetes mellitus, type 2 (HCC)    Insulin therapy; exacerbated by prednisone  . Dysrhythmia    AFib  . Gastroparesis    99% retention 05/2008 on GES  . GERD (gastroesophageal reflux disease)   . Hiatal hernia   . Hyperlipidemia   . Hypertension   . Hypothyroid   . IBS (irritable bowel syndrome)   . Obesity   . OSA on CPAP    had CPAP and cannot tolerate.  . Paroxysmal atrial fibrillation (HCC)   . Pulmonary hypertension (HCC) 01/2015   a. Predominantly pulmonary venous hypertension but may be component of PAH.  . Seizures (HCC)    last seizure was 2 years ago; on keppra for this; unknown etiology  . Syncope    a. Admitted 05/2009; magnetic resonance imagin/ MRA - negative; etiology thought to be orthostasis secondary to drugs and dehydration. b. Syncope 02/2015 also felt 2/2 dehydration.    Family History  Problem Relation Age of Onset  . Hypertension Mother   . Alzheimer's disease Mother   . Stroke Mother   . Heart attack Mother   . Hypertension Other   . Breast cancer Sister   .  Heart disease Neg Hx   . Colon cancer Neg Hx    Social History:   reports that she quit smoking about 37 years ago. Her smoking use included cigarettes. She started smoking about 54 years ago. She has a 3.75 pack-year smoking history. She has never used smokeless tobacco. She reports that she does not drink alcohol and does not use drugs.  Medications  Current Facility-Administered Medications:  .  0.9 %  sodium chloride infusion, , Intravenous, PRN, Lupita Leash, MD, Stopped at 09/06/20 1228 .  acetaminophen (TYLENOL) tablet 650 mg, 650 mg, Per Tube, Q6H PRN, 650 mg at 09/08/20 0816 **OR** acetaminophen (TYLENOL) suppository 650 mg, 650 mg, Rectal, Q6H PRN, Cipriano Bunker, MD .  amiodarone (PACERONE) tablet 200 mg, 200 mg, Per Tube, Daily, Cipriano Bunker, MD, 200 mg at 09/08/20 1031 .  apixaban (ELIQUIS) tablet 5 mg, 5 mg, Per Tube, BID, Cipriano Bunker, MD, 5 mg at 09/08/20 1202 .  aspirin chewable tablet 81 mg, 81 mg, Per Tube, Daily, Cipriano Bunker, MD, 81 mg at 09/08/20 1032 .  chlorhexidine (PERIDEX) 0.12 %  solution 15 mL, 15 mL, Mouth Rinse, BID, Oretha Milch, MD, 15 mL at 09/08/20 1033 .  Chlorhexidine Gluconate Cloth 2 % PADS 6 each, 6 each, Topical, Daily, Max Fickle B, MD, 6 each at 09/07/20 2300 .  donepezil (ARICEPT) tablet 5 mg, 5 mg, Per Tube, QHS, Cipriano Bunker, MD, 5 mg at 09/07/20 2216 .  feeding supplement (OSMOLITE 1.2 CAL) liquid 1,000 mL, 1,000 mL, Per Tube, Continuous, Cipriano Bunker, MD, Last Rate: 60 mL/hr at 09/08/20 1154, 1,000 mL at 09/08/20 1154 .  feeding supplement (PROSource TF) liquid 45 mL, 45 mL, Per Tube, BID, Cipriano Bunker, MD, 45 mL at 09/08/20 1034 .  free water 250 mL, 250 mL, Per Tube, Q4H, Cipriano Bunker, MD .  guaiFENesin (ROBITUSSIN) 100 MG/5ML solution 100 mg, 5 mL, Per Tube, Q6H, Cipriano Bunker, MD, 100 mg at 09/08/20 1032 .  hydrALAZINE (APRESOLINE) injection 10 mg, 10 mg, Intravenous, Q6H PRN, Albertine Grates, MD .  insulin aspart  (novoLOG) injection 0-20 Units, 0-20 Units, Subcutaneous, Q4H, Cloyd Stagers M, PA-C, 7 Units at 09/08/20 1151 .  insulin aspart (novoLOG) injection 3 Units, 3 Units, Subcutaneous, Q4H, Cipriano Bunker, MD, 3 Units at 09/08/20 1151 .  insulin glargine (LANTUS) injection 25 Units, 25 Units, Subcutaneous, BID, Oretha Milch, MD, 25 Units at 09/08/20 1035 .  ipratropium-albuterol (DUONEB) 0.5-2.5 (3) MG/3ML nebulizer solution 3 mL, 3 mL, Nebulization, Q4H PRN, Tat, David, MD .  ketorolac (TORADOL) 15 MG/ML injection 15 mg, 15 mg, Intravenous, Q8H PRN, Oretha Milch, MD, 15 mg at 09/07/20 2207 .  labetalol (NORMODYNE) injection 10 mg, 10 mg, Intravenous, Q4H PRN, Max Fickle B, MD, 10 mg at 09/04/20 0308 .  levETIRAcetam (KEPPRA) 100 MG/ML solution 1,000 mg, 1,000 mg, Per Tube, BID, Cipriano Bunker, MD, 1,000 mg at 09/08/20 1031 .  levothyroxine (SYNTHROID) tablet 175 mcg, 175 mcg, Per Tube, Q0600, Cipriano Bunker, MD, 175 mcg at 09/08/20 0514 .  loperamide HCl (IMODIUM) 1 MG/7.5ML suspension 1 mg, 1 mg, Per Tube, BID PRN, Cipriano Bunker, MD, 1 mg at 09/07/20 2216 .  MEDLINE mouth rinse, 15 mL, Mouth Rinse, q12n4p, Oretha Milch, MD, 15 mL at 09/08/20 1154 .  metoprolol tartrate (LOPRESSOR) 25 mg/10 mL oral suspension 12.5 mg, 12.5 mg, Per Tube, BID, Cipriano Bunker, MD, 12.5 mg at 09/08/20 1031 .  naloxone [facility]) injection 0.4 mg, 0.4 mg, Intravenous, PRN, Tat, David, MD, 0.4 mg at 08/30/20 0328 .  ondansetron (ZOFRAN) tablet 4 mg, 4 mg, Oral, Q6H PRN **OR** ondansetron (ZOFRAN) injection 4 mg, 4 mg, Intravenous, Q6H PRN, Tat, David, MD .  sodium chloride flush (NS) 0.9 % injection 10-40 mL, 10-40 mL, Intracatheter, Q12H, McQuaid, Douglas B, MD, 3 mL at 09/08/20 1035 .  sodium chloride flush (NS) 0.9 % injection 10-40 mL, 10-40 mL, Intracatheter, PRN, Lupita Leash, MD   Exam: Current vital signs: BP (!) 114/59 (BP Location: Right Arm)   Pulse 94   Temp 98.1 F (36.7 C) (Oral)    Resp (!) 22   Ht 5\' 6"  (1.676 m)   Wt 99.1 kg   SpO2 100%   BMI 35.26 kg/m  Vital signs in last 24 hours: Temp:  [97.9 F (36.6 C)-98.4 F (36.9 C)] 98.1 F (36.7 C) (03/08 1128) Pulse Rate:  [70-112] 94 (03/08 1300) Resp:  [0-34] 22 (03/08 1300) BP: (96-146)/(45-86) 114/59 (03/08 1200) SpO2:  [86 %-100 %] 100 % (03/08 1300) FiO2 (%):  [32 %] 32 % (03/08 0803) Weight:  [99.1  kg] 99.1 kg (03/08 0402)  GENERAL: Awake, alert in NAD HEENT: - Normocephalic and atraumatic, dry mm,  LUNGS - Normal respiratory effort. SaO2 CV - RRR on tele ABDOMEN - Soft, nontender Ext: warm, well perfused, some bruising to the arms  NEURO:  Mental Status: Alert but disoriented, did say yes once to her name being called; however did not speak again Speech/Language: patient is essentially mute, on attending examination occasionally appeared to mouth words but nothing audible.  Family reports she said "okay" today and identified her husband saying "pop-pop" when he entered the room Cranial Nerves:  II: PERRL 3 mm/brisk.  III, IV, VI: EOMI. Lid elevation symmetric and full.  V: Symmetric response to eyelash brush VII: Face is symmetric resting and smiling VIII: Intact to voice IX, X: Patient does not follow commands and has NG tube in. XI: Unable to assess XII: Unable to completely assess but can see tongue moving in patients mouth when she licks her lips   Motor: Grossly symmetric, moves bilateral upper extremities at least 3/5 and bilateral lower extremities 2/5 Tone is normal. Bulk is normal.  Sensation-responsive to tickle/light touch in all 4 extremities grossly equally Coordination: unable to assess DTRs: 2+ throughout.  Gait- deferred  1a Level of Conscious.: 0 1b LOC Questions: 2 1c LOC Commands: 2 2 Best Gaze: 0 3 Visual: 0 4 Facial Palsy: 0 5a Motor Arm - left: 4 5b Motor Arm - Right: 2 6a Motor Leg - Left: 4 6b Motor Leg - Right: 4 7 Limb Ataxia: 2 8 Sensory: 2 9 Best Language:  3 10 Dysarthria: 2 11 Extinct. and Inatten.: 2 TOTAL: 29   Labs I have reviewed labs in epic and the results pertinent to this consultation are:   CBC    Component Value Date/Time   WBC 7.4 09/08/2020 0440   RBC 2.65 (L) 09/08/2020 0440   HGB 8.8 (L) 09/08/2020 0440   HCT 28.4 (L) 09/08/2020 0440   PLT 195 09/08/2020 0440   MCV 107.2 (H) 09/08/2020 0440   MCH 33.2 09/08/2020 0440   MCHC 31.0 09/08/2020 0440   RDW 18.0 (H) 09/08/2020 0440   LYMPHSABS 2,526 04/08/2020 0810   MONOABS 1.3 (H) 11/16/2019 1103   EOSABS 164 04/08/2020 0810   BASOSABS 57 04/08/2020 0810    CMP     Component Value Date/Time   NA 149 (H) 09/08/2020 0440   K 3.0 (L) 09/08/2020 0440   CL 116 (H) 09/08/2020 0440   CO2 23 09/08/2020 0440   GLUCOSE 266 (H) 09/08/2020 0440   BUN 27 (H) 09/08/2020 0440   CREATININE 1.10 (H) 09/08/2020 0440   CREATININE 1.40 (H) 04/08/2020 0813   CALCIUM 9.4 09/08/2020 0440   CALCIUM 8.4 06/30/2013 0318   PROT 5.8 (L) 09/08/2020 0440   ALBUMIN 2.7 (L) 09/08/2020 0440   AST 17 09/08/2020 0440   ALT 19 09/08/2020 0440   ALKPHOS 50 09/08/2020 0440   BILITOT 1.0 09/08/2020 0440   GFRNONAA 54 (L) 09/08/2020 0440   GFRNONAA 50 (L) 06/09/2017 1223   GFRAA 50 (L) 11/16/2019 1103   GFRAA 58 (L) 06/09/2017 1223    Lipid Panel     Component Value Date/Time   CHOL 149 08/29/2020 0416   TRIG 104 08/31/2020 0249   HDL 38 (L) 08/29/2020 0416   CHOLHDL 3.9 08/29/2020 0416   VLDL 23 08/29/2020 0416   LDLCALC 88 08/29/2020 0416     Imaging I have reviewed the images obtained: CT ABDOMEN PELVIS  WO CONTRAST  Result Date: 08/24/2020 CLINICAL DATA:  Status post fall, unresponsive. EXAM: CT CHEST, ABDOMEN AND PELVIS WITHOUT CONTRAST TECHNIQUE: Multidetector CT imaging of the chest, abdomen and pelvis was performed following the standard protocol without IV contrast. COMPARISON:  None. FINDINGS: CT CHEST FINDINGS Cardiovascular: A right internal jugular venous catheter is  in place. There is moderate severity calcification of the aortic arch. There is moderate severity cardiomegaly. No pericardial effusion. Mediastinum/Nodes: No enlarged mediastinal, hilar, or axillary lymph nodes. Thyroid gland, trachea, and esophagus demonstrate no significant findings. Lungs/Pleura: Endotracheal and nasogastric tubes are in place. Moderate severity left upper lobe and bilateral lower lobe atelectasis and/or infiltrate is noted. Small bilateral pleural effusions are noted, right greater than left. No pneumothorax is identified. Musculoskeletal: There is evidence of prior vertebroplasty at the level of T12. A chronic fracture deformity of the sternum is seen. Multilevel degenerative changes are noted throughout the thoracic spine. CT ABDOMEN PELVIS FINDINGS Hepatobiliary: No focal liver abnormality is seen. Status post cholecystectomy. No biliary dilatation. Pancreas: Unremarkable. No pancreatic ductal dilatation or surrounding inflammatory changes. Spleen: Normal in size without focal abnormality. Adrenals/Urinary Tract: Adrenal glands are unremarkable. Kidneys are normal, without renal calculi, focal lesion, or hydronephrosis. A Foley catheter is seen within the urinary bladder. Stomach/Bowel: A nasogastric tube is seen with its distal tip noted within the proximal duodenum. The stomach is otherwise within normal limits. The appendix is not clearly visualized. No evidence of bowel wall thickening, distention, or inflammatory changes. Noninflamed diverticula are seen throughout the sigmoid colon. Vascular/Lymphatic: Aortic atherosclerosis. No enlarged abdominal or pelvic lymph nodes. Reproductive: Status post hysterectomy. No adnexal masses. Other: There is a 2.5 cm x 4.5 cm fat containing umbilical hernia. No abdominopelvic ascites. Musculoskeletal: Multilevel degenerative changes seen throughout the lumbar spine. IMPRESSION: 1. Moderate severity left upper lobe and bilateral lower lobe atelectasis  and/or infiltrate. 2. Small bilateral pleural effusions, right greater than left. 3. Moderate severity cardiomegaly. 4. Evidence of prior vertebroplasty at the level of T12. 5. Sigmoid diverticulosis. 6. Fat-containing umbilical hernia. 7. Aortic atherosclerosis. Aortic Atherosclerosis (ICD10-I70.0). Electronically Signed   By: Aram Candela M.D.   On: 08/24/2020 00:39   CT HEAD WO CONTRAST  Result Date: 09/06/2020 CLINICAL DATA:  Change in mental status EXAM: CT HEAD WITHOUT CONTRAST TECHNIQUE: Contiguous axial images were obtained from the base of the skull through the vertex without intravenous contrast. COMPARISON:  08/30/2020 FINDINGS: Brain: There is no acute intracranial hemorrhage, mass effect, or edema. Gray-white differentiation is preserved. There is no extra-axial fluid collection. Prominence of the ventricles and sulci reflects stable parenchymal volume loss. Patchy hypoattenuation in the supratentorial white matter likely reflects stable chronic microvascular ischemic changes. Vascular: There is atherosclerotic calcification at the skull base. Skull: Calvarium is unremarkable. Sinuses/Orbits: No acute finding. Other: Bilateral mastoid effusions. IMPRESSION: No acute intracranial abnormality. No significant change since 08/30/2020. Electronically Signed   By: Guadlupe Spanish M.D.   On: 09/06/2020 14:17   CT HEAD WO CONTRAST  Result Date: 08/30/2020 CLINICAL DATA:  Mental status changes. Decreased responsiveness. Acute stroke on the right shown by recent MRI. EXAM: CT HEAD WITHOUT CONTRAST TECHNIQUE: Contiguous axial images were obtained from the base of the skull through the vertex without intravenous contrast. COMPARISON:  MRI 08/28/2020 FINDINGS: Brain: Mild age related volume loss. No focal brain insult visible by CT. Tiny white matter infarction in the right posterior frontal region is not resolved. No evidence new or progressive infarction. No hemorrhage, hydrocephalus or extra-axial  collection.  Vascular: There is atherosclerotic calcification of the major vessels at the base of the brain. Skull: Negative Sinuses/Orbits: Clear/normal Other: None IMPRESSION: No acute finding by CT. Tiny white matter infarction in the right posterior frontal region shown by MRI is not resolved. No evidence of new or progressive infarction. Electronically Signed   By: Paulina FusiMark  Shogry M.D.   On: 08/30/2020 01:10   CT HEAD WO CONTRAST  Result Date: 08/24/2020 CLINICAL DATA:  Status post fall, unresponsive. EXAM: CT HEAD WITHOUT CONTRAST TECHNIQUE: Contiguous axial images were obtained from the base of the skull through the vertex without intravenous contrast. COMPARISON:  August 15, 2020 FINDINGS: Brain: There is mild cerebral atrophy with widening of the extra-axial spaces and ventricular dilatation. There are areas of decreased attenuation within the white matter tracts of the supratentorial brain, consistent with microvascular disease changes. Vascular: No hyperdense vessel or unexpected calcification. Skull: Normal. Negative for fracture or focal lesion. Sinuses/Orbits: No acute finding. Other: Endotracheal and orogastric tubes are in place. IMPRESSION: No acute intracranial pathology. Electronically Signed   By: Aram Candelahaddeus  Houston M.D.   On: 08/24/2020 00:30   CT Chest Wo Contrast  Result Date: 08/24/2020 CLINICAL DATA:  Status post fall, unresponsive. EXAM: CT CHEST, ABDOMEN AND PELVIS WITHOUT CONTRAST TECHNIQUE: Multidetector CT imaging of the chest, abdomen and pelvis was performed following the standard protocol without IV contrast. COMPARISON:  None. FINDINGS: CT CHEST FINDINGS Cardiovascular: A right internal jugular venous catheter is in place. There is moderate severity calcification of the aortic arch. There is moderate severity cardiomegaly. No pericardial effusion. Mediastinum/Nodes: No enlarged mediastinal, hilar, or axillary lymph nodes. Thyroid gland, trachea, and esophagus demonstrate no  significant findings. Lungs/Pleura: Endotracheal and nasogastric tubes are in place. Moderate severity left upper lobe and bilateral lower lobe atelectasis and/or infiltrate is noted. Small bilateral pleural effusions are noted, right greater than left. No pneumothorax is identified. Musculoskeletal: There is evidence of prior vertebroplasty at the level of T12. A chronic fracture deformity of the sternum is seen. Multilevel degenerative changes are noted throughout the thoracic spine. CT ABDOMEN PELVIS FINDINGS Hepatobiliary: No focal liver abnormality is seen. Status post cholecystectomy. No biliary dilatation. Pancreas: Unremarkable. No pancreatic ductal dilatation or surrounding inflammatory changes. Spleen: Normal in size without focal abnormality. Adrenals/Urinary Tract: Adrenal glands are unremarkable. Kidneys are normal, without renal calculi, focal lesion, or hydronephrosis. A Foley catheter is seen within the urinary bladder. Stomach/Bowel: A nasogastric tube is seen with its distal tip noted within the proximal duodenum. The stomach is otherwise within normal limits. The appendix is not clearly visualized. No evidence of bowel wall thickening, distention, or inflammatory changes. Noninflamed diverticula are seen throughout the sigmoid colon. Vascular/Lymphatic: Aortic atherosclerosis. No enlarged abdominal or pelvic lymph nodes. Reproductive: Status post hysterectomy. No adnexal masses. Other: There is a 2.5 cm x 4.5 cm fat containing umbilical hernia. No abdominopelvic ascites. Musculoskeletal: Multilevel degenerative changes seen throughout the lumbar spine. IMPRESSION: 1. Moderate severity left upper lobe and bilateral lower lobe atelectasis and/or infiltrate. 2. Small bilateral pleural effusions, right greater than left. 3. Moderate severity cardiomegaly. 4. Evidence of prior vertebroplasty at the level of T12. 5. Sigmoid diverticulosis. 6. Fat-containing umbilical hernia. 7. Aortic atherosclerosis.  Aortic Atherosclerosis (ICD10-I70.0). Electronically Signed   By: Aram Candelahaddeus  Houston M.D.   On: 08/24/2020 00:40   CT CERVICAL SPINE WO CONTRAST  Result Date: 08/24/2020 CLINICAL DATA:  Status post fall, unresponsive. EXAM: CT CERVICAL SPINE WITHOUT CONTRAST TECHNIQUE: Multidetector CT imaging of the cervical spine was performed  without intravenous contrast. Multiplanar CT image reconstructions were also generated. COMPARISON:  October 04, 2012 FINDINGS: Alignment: Normal. Skull base and vertebrae: No acute fracture. No primary bone lesion or focal pathologic process. Soft tissues and spinal canal: No prevertebral fluid or swelling. No visible canal hematoma. Disc levels: Marked severity endplate sclerosis and osteophyte formation is seen at the levels of C3-C4, C4-C5, C5-C6 and C6-C7. There is marked severity narrowing of the anterior atlantoaxial articulation. Mild to moderate severity intervertebral disc space narrowing is seen at the levels of C5-C6 and C6-C7. Moderate severity bilateral multilevel facet joint hypertrophy is noted. Upper chest: Negative. Other: Endotracheal and orogastric tubes are noted. IMPRESSION: 1. No acute osseous abnormality. 2. Marked severity multilevel degenerative changes, most prominent at the levels of C3-C4, C4-C5, C5-C6 and C6-C7. Electronically Signed   By: Aram Candela M.D.   On: 08/24/2020 00:32   MR BRAIN WO CONTRAST  Result Date: 08/28/2020 CLINICAL DATA:  Mental status change, unknown cause EXAM: MRI HEAD WITHOUT CONTRAST TECHNIQUE: Multiplanar, multiecho pulse sequences of the brain and surrounding structures were obtained without intravenous contrast. COMPARISON:  08/24/2020 and prior. FINDINGS: Please note some image sequences are degraded by motion artifact. Brain: 2 mm right centrum semiovale acute/subacute insult. No intracranial hemorrhage. No midline shift, ventriculomegaly or extra-axial fluid collection. No mass lesion. Mild cerebral atrophy with ex vacuo  dilatation. Mild chronic microvascular ischemic changes. Vascular: Normal flow voids. Skull and upper cervical spine: Normal marrow signal. Sinuses/Orbits: Normal orbits. Small sphenoid sinus mucous retention cyst. Bilateral mastoid effusions. Other: None. IMPRESSION: Acute/subacute 2 mm right centrum semiovale lacunar insult. Mild cerebral atrophy and chronic microvascular ischemic changes. These results will be called to the ordering clinician or representative by the Radiologist Assistant, and communication documented in the PACS or [employer]. Electronically Signed   By: Stana Bunting M.D.   On: 08/28/2020 18:52   US Carotid Bilateral  Result Date: 08/29/2020 CLINICAL DATA:  CVA. History hypertension, hyperlipidemia and diabetes. EXAM: BILATERAL CAROTID DUPLEX ULTRASOUND TECHNIQUE: Wallace Cullens scale imaging, color Doppler and duplex ultrasound were performed of bilateral carotid and vertebral arteries in the neck. COMPARISON:  12/28/2015 FINDINGS: Criteria: Quantification of carotid stenosis is based on velocity parameters that correlate the residual internal carotid diameter with NASCET-based stenosis levels, using the diameter of the distal internal carotid lumen as the denominator for stenosis measurement. The following velocity measurements were obtained: RIGHT The right carotid system and vertebral artery were not evaluated secondary to overlying bandages. LEFT ICA: 55/9 cm/sec CCA: 57/10 cm/sec SYSTOLIC ICA/CCA RATIO:  1.0 ECA: 82 cm/sec LEFT CAROTID ARTERY: There is a minimal amount of eccentric echogenic plaque within the left carotid bulb (image 15 and 17), extending to involve the origin and proximal aspects of the left internal carotid artery (image 25), not resulting in elevated peak systolic velocities within the interrogated course the left internal carotid artery to suggest a hemodynamically significant stenosis. LEFT VERTEBRAL ARTERY:  Antegrade flow IMPRESSION: 1. Minimal amount of  left-sided atherosclerotic plaque, morphologically similar to the 2017 examination and again not resulting in hemodynamically significant stenosis. 2. The right carotid system and vertebral artery were not evaluated secondary to overlying bandages. While no hemodynamically significant narrowing was demonstrated on prior carotid Doppler ultrasound performed in 2017, if clinical concern persists, further evaluation with CTA of the head and neck could be performed as indicated. Electronically Signed   By: Simonne Come M.D.   On: 08/29/2020 14:33   EEG adult  Result Date: 09/07/2020 Lindie Spruce  Val Eagle, MD     09/07/2020  1:07 PM Patient Name: [name] MRN: [id number] Epilepsy Attending: Charlsie Quest Referring Physician/Provider: Dr. Albertine Grates Date: 09/07/2020 Duration: 25.55 mins Patient history: 70 year old female with altered mental status. EEG to evaluate for seizures. Level of alertness: Awake AEDs during EEG study: Keppra Technical aspects: This EEG study was done with scalp electrodes positioned according to the 10-20 International system of electrode placement. Electrical activity was acquired at a sampling rate of 500Hz  and reviewed with a high frequency filter of 70Hz  and a low frequency filter of 1Hz . EEG data were recorded continuously and digitally stored. Description: The posterior dominant rhythm consists of 7 Hz activity of moderate voltage (25-35 uV) seen predominantly in posterior head regions, symmetric and reactive to eye opening and eye closing. EEG showed continuous generalized 3 to 6 Hz theta-delta slowing. Hyperventilation and photic stimulation were not performed.   ABNORMALITY -Continuous slow, generalized IMPRESSION: This study is suggestive of mild to moderate diffuse encephalopathy, nonspecific etiology. No seizures or epileptiform discharges were seen throughout the recording. Charlsie Quest   ECHOCARDIOGRAM COMPLETE  Result Date: 08/29/2020    ECHOCARDIOGRAM REPORT   Patient Name:    [name] Date of Exam: 08/29/2020 Medical Rec #:  [id number]     Height:       66.0 in Accession #:    [id number]    Weight:       235.7 lb Date of Birth:  1951-01-18     BSA:          2.144 m Patient Age:    69 years      BP:           168/101 mmHg Patient Gender: F             HR:           102 bpm. Exam Location:  Inpatient Procedure: 2D Echo, Cardiac Doppler and Color Doppler Indications:    Stroke I63.9  History:        Patient has prior history of Echocardiogram examinations, most                 recent 11/28/2017. CHF, Arrythmias:Atrial Fibrillation; Risk                 Factors:Diabetes. Acute respiratory failure secondary to                 possible pneumonia. Septic shock. Chronic kidney disease.                 Anemia.  Sonographer:    Leta Jungling RDCS Referring Phys: [phone] DAVID TAT IMPRESSIONS  1. Left ventricular ejection fraction, by estimation, is 50 to 55%. The left ventricle has normal function. The left ventricle has no regional wall motion abnormalities. There is severe left ventricular hypertrophy. Left ventricular diastolic parameters  are indeterminate.  2. Right ventricular systolic function is mildly reduced. The right ventricular size is mildly enlarged.  3. Left atrial size was severely dilated.  4. Right atrial size was severely dilated.  5. The mitral valve is normal in structure. Mild mitral valve regurgitation. No evidence of mitral stenosis.  6. The tricuspid valve is abnormal. Tricuspid valve regurgitation is moderate.  7. The aortic valve is tricuspid. Aortic valve regurgitation is not visualized. No aortic stenosis is present.  8. The inferior vena cava is normal in size with <50% respiratory variability, suggesting right atrial pressure of 8 mmHg. FINDINGS  Left Ventricle: Left ventricular ejection fraction, by estimation, is 50 to 55%. The left ventricle has normal function. The left ventricle has no regional wall motion abnormalities. The left ventricular internal cavity  size was normal in size. There is  severe left ventricular hypertrophy. Left ventricular diastolic parameters are indeterminate. Right Ventricle: The right ventricular size is mildly enlarged. Right vetricular wall thickness was not assessed. Right ventricular systolic function is mildly reduced. The tricuspid regurgitant velocity is 3.96 m/s, and with an assumed right atrial pressure of 15 mmHg, the estimated right ventricular systolic pressure is 77.7 mmHg. Left Atrium: Left atrial size was severely dilated. Right Atrium: Right atrial size was severely dilated. Pericardium: There is no evidence of pericardial effusion. Mitral Valve: The mitral valve is normal in structure. There is mild thickening of the mitral valve leaflet(s). There is mild calcification of the mitral valve leaflet(s). Mild mitral annular calcification. Mild mitral valve regurgitation. No evidence of  mitral valve stenosis. Tricuspid Valve: The tricuspid valve is abnormal. Tricuspid valve regurgitation is moderate . No evidence of tricuspid stenosis. Aortic Valve: The aortic valve is tricuspid. Aortic valve regurgitation is not visualized. No aortic stenosis is present. Aortic valve mean gradient measures 3.1 mmHg. Aortic valve peak gradient measures 6.9 mmHg. Aortic valve area, by VTI measures 3.30 cm. Pulmonic Valve: The pulmonic valve was not well visualized. Pulmonic valve regurgitation is not visualized. No evidence of pulmonic stenosis. Aorta: The aortic root is normal in size and structure. Pulmonary Artery: Severe pulmonary HTN, PASP is 70. Venous: The inferior vena cava is normal in size with less than 50% respiratory variability, suggesting right atrial pressure of 8 mmHg. IAS/Shunts: No atrial level shunt detected by color flow Doppler.  LEFT VENTRICLE PLAX 2D LVIDd:         3.79 cm LVIDs:         2.97 cm LV PW:         1.99 cm LV IVS:        1.67 cm LVOT diam:     2.30 cm LV SV:         62 LV SV Index:   29 LVOT Area:     4.15 cm   RIGHT VENTRICLE RV S prime:     8.85 cm/s TAPSE (M-mode): 1.5 cm LEFT ATRIUM              Index       RIGHT ATRIUM           Index LA diam:        5.10 cm  2.38 cm/m  RA Area:     32.90 cm LA Vol (A2C):   99.6 ml  46.45 ml/m RA Volume:   114.00 ml 53.16 ml/m LA Vol (A4C):   112.0 ml 52.23 ml/m LA Biplane Vol: 106.0 ml 49.43 ml/m  AORTIC VALVE AV Area (Vmax):    3.04 cm AV Area (Vmean):   2.88 cm AV Area (VTI):     3.30 cm AV Vmax:           131.09 cm/s AV Vmean:          82.375 cm/s AV VTI:            0.189 m AV Peak Grad:      6.9 mmHg AV Mean Grad:      3.1 mmHg LVOT Vmax:         95.98 cm/s LVOT Vmean:        57.106 cm/s LVOT VTI:  0.150 m LVOT/AV VTI ratio: 0.79  AORTA Ao Root diam: 3.10 cm Ao Asc diam:  3.00 cm MITRAL VALVE                TRICUSPID VALVE MV Area (PHT): 8.37 cm     TR Peak grad:   62.7 mmHg MV Decel Time: 91 msec      TR Vmax:        396.00 cm/s MV E velocity: 125.67 cm/s                             SHUNTS                             Systemic VTI:  0.15 m                             Systemic Diam: 2.30 cm Dina Rich MD Electronically signed by Dina Rich MD Signature Date/Time: 08/29/2020/1:40:18 PM    Final     Assessment:   The patient's confusion, inattention, and waxing / waning mental status is most suggestive of delirium, perhaps in the setting of infection (resolving pneumonia), polypharmacy, potential diffuse brain injury due to hypoxia (will evaluate with repeat MRI) on a background of dementia, hospitalization / intensive care, and disrupted sleep cycle (sleeping during the day).  At this time there remains concern for another CVA vs latent hypoxic injury to the brain 2/2 large mucous plug that caused prolonged hypoxia noted early in hospitalization.  At this time MRI may or may not reveal hypoxic injury given the number of days since the peak hypoxic event, but if damage is noted this may provide useful information for prognostication.  Additionally  discussed that in the setting of pre-existing dementia neurological recovery from serious hospitalization tends to be delayed and incomplete.   Recommendations: - MRI Brain w/o contrast, MRA head w/, MRA neck w/ and w/o - Continue B12 - F/u Vit B1 labs - Continue Eliquis 5mg  BID - Continue to hold Ativan - Resume home Lipitor unless contraindicated, favor increase to 80 mg given LDL > 70 and home dose was 40 mg Additionally, please follow these delirium precautions:   - try to minimize deliriogenic medications as much as possible (J Am Geriatr Soc. 2012 Apr;60(4):616-31): benzodiazepines, anticholinergics, diphenhydramine, antihistamines, narcotics, Ambien/Lunesta/Sonata etc. - environmental support for delirium: Lights on during the day, patient up and out of bed as much as is feasible, OT/PT, quiet dimly lit room at night, reorient patient often, provide hearing aides and glasses if patient uses them routinely, minimize sleep disruptions as much as possible overnight.  As much as possible, reorient patient, and have them engage patient in activities, e.g. playing cards. TV should be off or on neutral background music unless patient engaged and watching. Try to keep interactions with the patient calm and quiet.    - Should MRI brain and vessel imaging be unremarkable, neurology will sign off and be available on an as-needed basis going forward.  We will follow up the imaging studies above  2013, MD PGY-1  Attending Neurologist's note:  I personally saw this patient, gathering history, performing a full neurologic examination, reviewing relevant labs, personally reviewing relevant imaging including MRI brain and Head CT, and formulated the assessment and plan, adding the note above for completeness and clarity to accurately reflect my thoughts  Brooke Dare MD-PhD [facility] [phone] Available 7 AM to 7 PM, outside these hours please contact Neurologist on call  listed on AMION   Addendum: MRI brain is unremarkable, within limits of significant motion degradation.  As above, no further work-up would be recommended at this time, vessel imaging can be deferred to an outpatient basis given that all but the right carotid was adequately imaged previously, and in the absence of significant left carotid stenosis is doubt she will have significant right carotid stenosis.  Continue excellent supportive care per primary team.  Please repage neurology if new neurological questions arise

## 2020-09-08 NOTE — Progress Notes (Addendum)
Physical Therapy Treatment Patient Details Name: [name] MRN: [id number] DOB: 1951/01/07 Today's Date: 09/08/2020    History of Present Illness 70 year old female admitted to [facility] 2/20 in the setting of septic shock from pneumonia requiring intubation, (extubated 2/25). Transferred to [name] 2/27 with AMS, respiratory failure due to a left lung mucous plug, Intubated 2/27-3/4. PMhx: HTN, HLD, pulmonary HTN, CHF, AFib, DM, hypothyroidism, OSA    PT Comments    Family present and reports pt usually gets up at 7:30 a.m. and takes a nap from 10:00-3:00 each day. Unfortunately we arrived during her normal nap time and this may have made it more difficult for pt to wake up and attend to therapist and commands. Ultimately she dangled at the EOB for ~8 minutes with attempts to engage AROM (pt did assist with raising her head to neutral from forward flexed position). Returned to sidelying as pt to go down for CT and MRI of head.      Follow Up Recommendations  SNF;Supervision/Assistance - 24 hour     Equipment Recommendations  Wheelchair (measurements PT);Hospital bed;Other (comment) (hoyer lift)    Recommendations for Other Services       Precautions / Restrictions Precautions Precautions: Fall;Other (comment) Precaution Comments: flexiseal    Mobility  Bed Mobility Overal bed mobility: Needs Assistance Bed Mobility: Rolling;Sidelying to Sit;Sit to Sidelying Rolling: Min assist;Mod assist Sidelying to sit: Total assist;+2 for physical assistance;HOB elevated     Sit to sidelying: Max assist;+2 for physical assistance General bed mobility comments: on left side on arrival; up to sitting as above; on return to sidelying, pt assisting with taking her trunk down to mattress; rolld to supnie with mod assist and then back to her left side with min assist    Transfers                 General transfer comment: unable to attempt  Ambulation/Gait                 Stairs              Wheelchair Mobility    Modified Rankin (Stroke Patients Only)       Balance Overall balance assessment: Needs assistance Sitting-balance support: No upper extremity supported;Feet unsupported Sitting balance-Leahy Scale: Zero Sitting balance - Comments: required constant support to prevent posterior LOB                                    Cognition Arousal/Alertness: Lethargic Behavior During Therapy: Flat affect Overall Cognitive Status: Difficult to assess                                 General Comments: not vocalizing; not following simple commands (including "hold your head up" while seated EOB. Once returned to sidelying she lifted her head and shoulder off the bed to allow pillow to be placed      Exercises Other Exercises Other Exercises: seated PROM ankle pumps, LAQ, raising head to neutral from flexed position x 3 reps    General Comments General comments (skin integrity, edema, etc.): Daughter and "Papi" present during session. They report pt moved her feet for them and stated "Papi"      Pertinent Vitals/Pain Pain Assessment: Faces Faces Pain Scale: No hurt    Home Living  Prior Function            PT Goals (current goals can now be found in the care plan section) Acute Rehab PT Goals Patient Stated Goal: for pt to regain strength (per daughter) Time For Goal Achievement: 09/19/20 Potential to Achieve Goals: Fair Progress towards PT goals: Not progressing toward goals - comment    Frequency    Min 2X/week      PT Plan Frequency updated    Co-evaluation              AM-PAC PT "6 Clicks" Mobility   Outcome Measure  Help needed turning from your back to your side while in a flat bed without using bedrails?: Total Help needed moving from lying on your back to sitting on the side of a flat bed without using bedrails?: Total Help needed moving to and from a bed to a  chair (including a wheelchair)?: Total Help needed standing up from a chair using your arms (e.g., wheelchair or bedside chair)?: Total Help needed to walk in hospital room?: Total Help needed climbing 3-5 steps with a railing? : Total 6 Click Score: 6    End of Session Equipment Utilized During Treatment: Oxygen Activity Tolerance: Patient limited by lethargy Patient left: in bed;with call bell/phone within reach;with family/visitor present;with bed alarm set Nurse Communication: Mobility status;Need for lift equipment PT Visit Diagnosis: Other abnormalities of gait and mobility (R26.89);Muscle weakness (generalized) (M62.81);Difficulty in walking, not elsewhere classified (R26.2)     Time: 1308-6578 PT Time Calculation (min) (ACUTE ONLY): 27 min  Charges:  $Therapeutic Exercise: 8-22 mins $Therapeutic Activity: 8-22 mins                      [name], PT Pager [phone]    [name] 09/08/2020, 3:06 PM

## 2020-09-08 NOTE — Progress Notes (Signed)
PROGRESS NOTE    [name]  NTI:[id number] DOB: Feb 07, 1951 DOA: 08/23/2020 PCP: Lemmie Evens, MD    Brief Narrative: This 70 y/ofemale admitted to [facility] on 08/23/20 in the setting of septic shock from pneumonia requiring intubation, She had atrial fibrillation at baseline and had been on amiodarone, this was held for concerns of possible amiodarone induced lung toxicity and she was treated with steroids. She was extubated on 08/28/20. However, her mental status declined overnight from 2/26 through early 2/27. A blood gas was obtained which did not show hypercarbia. However after the blood gas was performed patient has worsening of hypoxia and mental status. In consultation with the pulmonary critical care service in [place] it was decided that she should be electively intubated and then transferred to [facility] for  bronchoscopy.  She is s/p bronchoscopy for mucus plugging, she finished abx treatment, then extubated on 3/4, transferred to hospitalist service on 3/6.  She appears to be extremely drowsy, does not follow commands, opens eyes on calling her name. Neurology consulted.  Significant Hospital Events:  2/20 Admitted to [facility], intubated 2/22 Stop amiodarone (esr 95) 2/23 Solumedrol trial @ 80 q 12 and reduced to 40 mg q 12h Pm 2/25  2/25 Extubated 2/26 Worsening mental status/respiratory failure 2/27 Transferred to [facility], reintubated, bronched 2/28 Decreasing vent settings 3/01 Failed vent wean 2/2 increased HR/RR 3/4 extubated , bipap q hs  Assessment & Plan:   Principal Problem:   Lobar pneumonia ([place]) Active Problems:   Hypothyroidism   Hyperlipidemia   Essential hypertension   Atrial fibrillation (HCC)   Congestive heart failure (HCC)   Gastroparesis   Seizure ([place])   Dementia ([place])   DM type 2 (diabetes mellitus, type 2) ([place])   Acute respiratory failure (HCC)   Respiratory failure with hypoxia ([place])   Fibromyalgia   Septic shock ([place])   Acute  ischemic stroke ([place])   Acute metabolic encephalopathy  Acute metabolic encephalopathy :  -Daughter reports at baseline, she is alert and independent. -She has been very drowsy , slightly improved today with open eyes, trying to communicate. -ABG showed no PCo2 retention, CT head no acute finding, EEG : No evidence of seizures. There is  mild hypernatremia, Delirium could be possibility.  Obtain UA. -Cortack inserted for nutrition. changed meds to IV or subQ, changed back to po through tube. -Monitor sodium level. 150 >149  -Neuro checks every 4 hours. -Neurology consulted for further evaluation.  Consider MRI without contrast.  Acute hypoxic respiratory failure due to left mainstem mucous pluging Recent healthcare associated pneumonia  She has recently finished course of antibiotics Zosyn on 3/2 S/p bronchoscopy on 2/27 with total occlusion of L mainstem with mucous plug, sent for Cx. Continue BiPAP nightly and as needed. Continue Pulmonary toilet (hypertonic saline, guaifenesin,chest PT via bed ). Obtain CXR.  Chronic A. Fib : Her heart rate remains controlled, amiodarone been resumed, no evidence of fibrosis on CT scan. She was on heparin drip then apixaban, changed to Lovenox subcu on 3/6 due to acute metabolic encephalopathy  We will switch Lovenox to Eliquis through tube.  Chronic diastolic CHF  Echo 8/67 with EF of 50-55%. Currently does not appear volume overloaded with poor oral intake. Hold Lasix  Hypokalemia : -Replace K as needed.  Hypernatremia Keep Lasix on hold.. Na 150> 149 Continue free water flush through core track tube if not able to take PO.  Insulin-dependent type 2 diabetes Uncontrolled, A1c 7.9 Continue insulin SS, adjust as  needed.  CKD 2/anemia of chronic disease Renal function and hemoglobin appear close to baseline. Monitor  Renal functions  History of seizures Continue Keppra 1000 mg twice daily through tube. EEG completed, no  evidence of seizures.  Hypothyroidism Resume Synthroid   Dementia On Aricept at home, currently not able to take oral meds.   Obesity: Body mass index is 36.08 kg/m. outpatient PCP follow up.   DVT prophylaxis: Eliquis Code Status: Full code Family Communication: Spoke with Daughter Disposition Plan:   Status is: Inpatient  Remains inpatient appropriate because:Inpatient level of care appropriate due to severity of illness  Dispo: The patient is from: Home              Anticipated d/c is to: TBD              Patient currently is not medically stable to d/c.   Difficult to place patient No  Consultants:   Pulm/ Critical care.  Procedures:  ETT 2/20 >>2/25, 2/27 >>3/4, tube feeding while on vent R IJ CVL 2/21 >>   Antimicrobials:  Anti-infectives (From admission, onward)   Start     Dose/Rate Route Frequency Ordered Stop   08/28/20 1900  vancomycin (VANCOREADY) IVPB 1250 mg/250 mL  Status:  Discontinued        1,250 mg 166.7 mL/hr over 90 Minutes Intravenous Every 24 hours 08/27/20 1820 08/28/20 1204   08/27/20 2200  piperacillin-tazobactam (ZOSYN) IVPB 3.375 g        3.375 g 12.5 mL/hr over 240 Minutes Intravenous Every 8 hours 08/27/20 1412 09/03/20 0141   08/27/20 1915  vancomycin (VANCOREADY) IVPB 2000 mg/400 mL        2,000 mg 200 mL/hr over 120 Minutes Intravenous  Once 08/27/20 1816 08/27/20 2156   08/27/20 1430  piperacillin-tazobactam (ZOSYN) IVPB 3.375 g        3.375 g 100 mL/hr over 30 Minutes Intravenous  Once 08/27/20 1412 08/27/20 1502   08/27/20 1000  ceFEPIme (MAXIPIME) 2 g in sodium chloride 0.9 % 100 mL IVPB  Status:  Discontinued        2 g 200 mL/hr over 30 Minutes Intravenous Every 12 hours 08/27/20 0744 08/27/20 1412   08/26/20 1000  ceFEPIme (MAXIPIME) 2 g in sodium chloride 0.9 % 100 mL IVPB  Status:  Discontinued        2 g 200 mL/hr over 30 Minutes Intravenous Every 8 hours 08/26/20 0848 08/27/20 0744   08/25/20 1200   vancomycin (VANCOCIN) IVPB 1000 mg/200 mL premix  Status:  Discontinued        1,000 mg 200 mL/hr over 60 Minutes Intravenous Every 24 hours 08/24/20 1043 08/24/20 1452   08/24/20 1200  vancomycin (VANCOREADY) IVPB 2000 mg/400 mL  Status:  Discontinued        2,000 mg 200 mL/hr over 120 Minutes Intravenous  Once 08/24/20 1009 08/24/20 1452   08/24/20 1045  ceFEPIme (MAXIPIME) 2 g in sodium chloride 0.9 % 100 mL IVPB  Status:  Discontinued        2 g 200 mL/hr over 30 Minutes Intravenous Every 12 hours 08/24/20 0958 08/26/20 0848   08/24/20 0100  ceFEPIme (MAXIPIME) 2 g in sodium chloride 0.9 % 100 mL IVPB        2 g 200 mL/hr over 30 Minutes Intravenous  Once 08/24/20 0047 08/24/20 0219       Subjective: Patient was seen and examined at bedside.  Overnight events noted.  Patient appears slightly improved.  He seems active awake with open eyes trying to communicate.  Not following commands.  EEG completed, no evidence of seizures.  Daughter is at bedside,  all questions answered.  Objective: Vitals:   09/08/20 0742 09/08/20 0800 09/08/20 0900 09/08/20 1000  BP:  126/76 133/86 (!) 146/76  Pulse:  100 (!) 103 97  Resp:  (!) 32 18 (!) 21  Temp: 98.2 F (36.8 C)     TempSrc: Oral     SpO2:  99% 99% 100%  Weight:      Height:        Intake/Output Summary (Last 24 hours) at 09/08/2020 1044 Last data filed at 09/08/2020 1000 Gross per 24 hour  Intake 1354.5 ml  Output 1275 ml  Net 79.5 ml   Filed Weights   09/06/20 0410 09/07/20 0351 09/08/20 0402  Weight: 101.4 kg 100.3 kg 99.1 kg    Examination:  General exam: Appears calm and comfortable, Drowsy, with eyes open. Respiratory system: Clear to auscultation. Respiratory effort normal. Cardiovascular system: S1 & S2 heard, RRR. No JVD, murmurs, rubs, gallops or clicks. No pedal edema. Gastrointestinal system: Abdomen is nondistended, soft and nontender. No organomegaly or masses felt.  Normal bowel sounds heard. Central nervous  system: Drowsy,  Not assessed. Extremities:  No edema, no cyanosis, no clubbing Skin: No rashes, lesions or ulcers Psychiatry: Judgement and insight appear normal. Mood & affect appropriate.     Data Reviewed: I have personally reviewed following labs and imaging studies  CBC: Recent Labs  Lab 09/04/20 0259 09/05/20 0418 09/06/20 0342 09/06/20 1238 09/07/20 0114 09/08/20 0440  WBC 11.1* 10.4 8.9  --  8.4 7.4  HGB 7.7* 8.3* 8.6* 9.2* 8.6* 8.8*  HCT 26.2* 26.5* 28.1* 27.0* 29.4* 28.4*  MCV 109.2* 107.7* 108.5*  --  109.7* 107.2*  PLT 153 162 182  --  176 353   Basic Metabolic Panel: Recent Labs  Lab 09/04/20 0259 09/05/20 0418 09/06/20 0342 09/06/20 1238 09/07/20 0114 09/08/20 0440  NA 147* 149* 150* 153* 150* 149*  K 3.3* 3.5 3.7 3.4* 4.0 3.0*  CL 115* 115* 113*  --  116* 116*  CO2 '23 24 23  ' --  23 23  GLUCOSE 269* 168* 178*  --  144* 266*  BUN 30* 23 27*  --  28* 27*  CREATININE 1.00 0.84 0.97  --  1.17* 1.10*  CALCIUM 9.5 9.4 9.4  --  9.1 9.4  MG  --  2.0  --   --  2.1 2.1  PHOS  --  3.5  --   --   --  3.5   GFR: Estimated Creatinine Clearance: 55.7 mL/min (A) (by C-G formula based on SCr of 1.1 mg/dL (H)). Liver Function Tests: Recent Labs  Lab 09/08/20 0440  AST 17  ALT 19  ALKPHOS 50  BILITOT 1.0  PROT 5.8*  ALBUMIN 2.7*   No results for input(s): LIPASE, AMYLASE in the last 168 hours. No results for input(s): AMMONIA in the last 168 hours. Coagulation Profile: No results for input(s): INR, PROTIME in the last 168 hours. Cardiac Enzymes: No results for input(s): CKTOTAL, CKMB, CKMBINDEX, TROPONINI in the last 168 hours. BNP (last 3 results) No results for input(s): PROBNP in the last 8760 hours. HbA1C: No results for input(s): HGBA1C in the last 72 hours. CBG: Recent Labs  Lab 09/07/20 1525 09/07/20 1916 09/07/20 2332 09/08/20 0325 09/08/20 0740  GLUCAP 133* 163* 193* 251* 212*   Lipid Profile: No results for input(s): CHOL,  HDL,  LDLCALC, TRIG, CHOLHDL, LDLDIRECT in the last 72 hours. Thyroid Function Tests: No results for input(s): TSH, T4TOTAL, FREET4, T3FREE, THYROIDAB in the last 72 hours. Anemia Panel: No results for input(s): VITAMINB12, FOLATE, FERRITIN, TIBC, IRON, RETICCTPCT in the last 72 hours. Sepsis Labs: No results for input(s): PROCALCITON, LATICACIDVEN in the last 168 hours.  Recent Results (from the past 240 hour(s))  Culture, Urine     Status: Abnormal   Collection Time: 08/30/20  8:40 AM   Specimen: Urine, Catheterized  Result Value Ref Range Status   Specimen Description   Final    URINE, CLEAN CATCH Performed at [facility], [address], [place], [place] [address]    Special Requests   Final    NONE Performed at [facility], [address] [place], [place] [address]    Culture 80,000 COLONIES/mL YEAST (A)  Final   Report Status 08/31/2020 FINAL  Final  Culture, Respiratory w Gram Stain     Status: None   Collection Time: 08/30/20  4:09 PM   Specimen: Tracheal Aspirate; Respiratory  Result Value Ref Range Status   Specimen Description TRACHEAL ASPIRATE  Final   Special Requests NONE  Final   Gram Stain   Final    MODERATE WBC PRESENT, PREDOMINANTLY PMN NO ORGANISMS SEEN Performed at [facility], [address] [place], [place] [address]    Culture RARE CANDIDA DUBLINIENSIS  Final   Report Status 09/03/2020 FINAL  Final    Radiology Studies: CT HEAD WO CONTRAST  Result Date: 09/06/2020 CLINICAL DATA:  Change in mental status EXAM: CT HEAD WITHOUT CONTRAST TECHNIQUE: Contiguous axial images were obtained from the base of the skull through the vertex without intravenous contrast. COMPARISON:  08/30/2020 FINDINGS: Brain: There is no acute intracranial hemorrhage, mass effect, or edema. Gray-white differentiation is preserved. There is no extra-axial fluid collection. Prominence of the ventricles and sulci reflects stable parenchymal volume loss. Patchy hypoattenuation in the  supratentorial white matter likely reflects stable chronic microvascular ischemic changes. Vascular: There is atherosclerotic calcification at the skull base. Skull: Calvarium is unremarkable. Sinuses/Orbits: No acute finding. Other: Bilateral mastoid effusions. IMPRESSION: No acute intracranial abnormality. No significant change since 08/30/2020. Electronically Signed   By: Macy Mis M.D.   On: 09/06/2020 14:17   EEG adult  Result Date: 09/07/2020 Lora Havens, MD     09/07/2020  1:07 PM Patient Name: [name] MRN: [id number] Epilepsy Attending: Lora Havens Referring Physician/Provider: Dr. Florencia Reasons Date: 09/07/2020 Duration: 25.55 mins Patient history: 70 year old female with altered mental status. EEG to evaluate for seizures. Level of alertness: Awake AEDs during EEG study: Keppra Technical aspects: This EEG study was done with scalp electrodes positioned according to the 10-20 International system of electrode placement. Electrical activity was acquired at a sampling rate of '500Hz'  and reviewed with a high frequency filter of '70Hz'  and a low frequency filter of '1Hz' . EEG data were recorded continuously and digitally stored. Description: The posterior dominant rhythm consists of 7 Hz activity of moderate voltage (25-35 uV) seen predominantly in posterior head regions, symmetric and reactive to eye opening and eye closing. EEG showed continuous generalized 3 to 6 Hz theta-delta slowing. Hyperventilation and photic stimulation were not performed.   ABNORMALITY -Continuous slow, generalized IMPRESSION: This study is suggestive of mild to moderate diffuse encephalopathy, nonspecific etiology. No seizures or epileptiform discharges were seen throughout the recording. Priyanka Barbra Sarks    Scheduled Meds: . amiodarone  200 mg  Per Tube Daily  . apixaban  5 mg Per Tube BID  . aspirin  81 mg Per Tube Daily  . chlorhexidine  15 mL Mouth Rinse BID  . Chlorhexidine Gluconate Cloth  6 each Topical Daily   . donepezil  5 mg Per Tube QHS  . feeding supplement (PROSource TF)  45 mL Per Tube BID  . free water  200 mL Per Tube Q4H  . guaiFENesin  5 mL Per Tube Q6H  . insulin aspart  0-20 Units Subcutaneous Q4H  . insulin aspart  3 Units Subcutaneous Q4H  . insulin glargine  25 Units Subcutaneous BID  . levETIRAcetam  1,000 mg Per Tube BID  . levothyroxine  175 mcg Per Tube Q0600  . mouth rinse  15 mL Mouth Rinse q12n4p  . metoprolol tartrate  12.5 mg Per Tube BID  . sodium chloride flush  10-40 mL Intracatheter Q12H   Continuous Infusions: . sodium chloride Stopped (09/06/20 1228)  . feeding supplement (OSMOLITE 1.2 CAL) 1,000 mL (09/07/20 1746)  . feeding supplement (VITAL AF 1.2 CAL)    . potassium chloride 10 mEq (09/08/20 0954)     LOS: 15 days    Time spent: 35 mins    Vicky Schleich, MD [facility]   If 7PM-7AM, please contact night-coverage

## 2020-09-08 NOTE — Progress Notes (Signed)
Inpatient Diabetes Program Recommendations  AACE/ADA: New Consensus Statement on Inpatient Glycemic Control   Target Ranges:  Prepandial:   less than 140 mg/dL      Peak postprandial:   less than 180 mg/dL (1-2 hours)      Critically ill patients:  140 - 180 mg/dL   Results for TANEQUA, KRETZ (MRN [id number]) as of 09/08/2020 09:33  Ref. Range 09/07/2020 03:26 09/07/2020 07:41 09/07/2020 11:19 09/07/2020 15:25 09/07/2020 19:16 09/07/2020 23:32 09/08/2020 03:25 09/08/2020 07:40  Glucose-Capillary Latest Ref Range: 70 - 99 mg/dL 193 (H) 96 790 (H) 240 (H) 163 (H) 193 (H) 251 (H) 212 (H)   Review of Glycemic Control  Diabetes history: DM2 Outpatient Diabetes medications: Lantus 20-30 units QHS, Metformin 500 mg BID, Glipizide 10 mg daily Current orders for Inpatient glycemic control: Lantus 25 units BID, Novolog 0-20 units Q4H; Osmolite @ 60 ml/hr  Inpatient Diabetes Program Recommendations:    Insulin: Please consider decreasing Lantus to 20 units BID and ordering Novolog 5 units Q4H for tube feeding coverage.  If tube feeding is stopped or held then Novolog tube feeding coverage should also be stopped or held.  Thanks, Orlando Penner, RN, MSN, CDE Diabetes Coordinator Inpatient Diabetes Program [phone] (Team Pager from 8am to 5pm)

## 2020-09-09 DIAGNOSIS — J181 Lobar pneumonia, unspecified organism: Secondary | ICD-10-CM | POA: Diagnosis not present

## 2020-09-09 LAB — GLUCOSE, CAPILLARY
Glucose-Capillary: 251 mg/dL — ABNORMAL HIGH (ref 70–99)
Glucose-Capillary: 259 mg/dL — ABNORMAL HIGH (ref 70–99)
Glucose-Capillary: 224 mg/dL — ABNORMAL HIGH (ref 70–99)
Glucose-Capillary: 215 mg/dL — ABNORMAL HIGH (ref 70–99)
Glucose-Capillary: 212 mg/dL — ABNORMAL HIGH (ref 70–99)
Glucose-Capillary: 168 mg/dL — ABNORMAL HIGH (ref 70–99)

## 2020-09-09 LAB — BASIC METABOLIC PANEL
Anion gap: 9 (ref 5–15)
BUN: 28 mg/dL — ABNORMAL HIGH (ref 8–23)
CO2: 21 mmol/L — ABNORMAL LOW (ref 22–32)
Calcium: 9 mg/dL (ref 8.9–10.3)
Chloride: 115 mmol/L — ABNORMAL HIGH (ref 98–111)
Creatinine, Ser: 1.07 mg/dL — ABNORMAL HIGH (ref 0.44–1.00)
GFR, Estimated: 56 mL/min — ABNORMAL LOW (ref >60)
Glucose, Bld: 282 mg/dL — ABNORMAL HIGH (ref 70–99)
Potassium: 3.9 mmol/L (ref 3.5–5.1)
Sodium: 145 mmol/L (ref 135–145)

## 2020-09-09 LAB — CBC
HCT: 27.6 % — ABNORMAL LOW (ref 36.0–46.0)
Hemoglobin: 8.5 g/dL — ABNORMAL LOW (ref 12.0–15.0)
MCH: 33.3 pg (ref 26.0–34.0)
MCHC: 30.8 g/dL (ref 30.0–36.0)
MCV: 108.2 fL — ABNORMAL HIGH (ref 80.0–100.0)
Platelets: 198 10*3/uL (ref 150–400)
RBC: 2.55 MIL/uL — ABNORMAL LOW (ref 3.87–5.11)
RDW: 18.2 % — ABNORMAL HIGH (ref 11.5–15.5)
WBC: 6 10*3/uL (ref 4.0–10.5)
nRBC: 0.7 % — ABNORMAL HIGH (ref 0.0–0.2)

## 2020-09-09 MED ORDER — FUROSEMIDE 40 MG PO TABS
40.0000 mg | ORAL_TABLET | Freq: Every day | ORAL | Status: DC
  Administered 2020-09-09 – 2020-09-18 (×10): 40 mg via ORAL
  Filled 2020-09-09 (×10): qty 1

## 2020-09-09 MED ORDER — DULOXETINE HCL 30 MG PO CPEP
30.0000 mg | ORAL_CAPSULE | Freq: Two times a day (BID) | ORAL | Status: DC
  Administered 2020-09-09 – 2020-09-10 (×3): 30 mg via ORAL
  Filled 2020-09-09 (×3): qty 1

## 2020-09-09 MED ORDER — INSULIN ASPART 100 UNIT/ML ~~LOC~~ SOLN
6.0000 [IU] | SUBCUTANEOUS | Status: DC
  Administered 2020-09-09 – 2020-09-15 (×35): 6 [IU] via SUBCUTANEOUS

## 2020-09-09 MED ORDER — INSULIN GLARGINE 100 UNIT/ML ~~LOC~~ SOLN
10.0000 [IU] | Freq: Once | SUBCUTANEOUS | Status: AC
  Administered 2020-09-09: 10 [IU] via SUBCUTANEOUS
  Filled 2020-09-09: qty 0.1

## 2020-09-09 MED ORDER — ATORVASTATIN CALCIUM 80 MG PO TABS
80.0000 mg | ORAL_TABLET | Freq: Every day | ORAL | Status: DC
  Administered 2020-09-09 – 2020-09-22 (×14): 80 mg
  Filled 2020-09-09: qty 2
  Filled 2020-09-09: qty 1
  Filled 2020-09-09 (×3): qty 2
  Filled 2020-09-09 (×6): qty 1
  Filled 2020-09-09: qty 2
  Filled 2020-09-09 (×2): qty 1

## 2020-09-09 MED ORDER — INSULIN GLARGINE 100 UNIT/ML ~~LOC~~ SOLN
35.0000 [IU] | Freq: Two times a day (BID) | SUBCUTANEOUS | Status: DC
  Administered 2020-09-09 – 2020-09-10 (×3): 35 [IU] via SUBCUTANEOUS
  Filled 2020-09-09 (×5): qty 0.35

## 2020-09-09 NOTE — Progress Notes (Signed)
Occupational Therapy Treatment Patient Details Name: [name] MRN: [id number] DOB: 08-19-1950 Today's Date: 09/09/2020    History of present illness 70 year old female admitted to [facility] 2/20 in the setting of septic shock from pneumonia requiring intubation, (extubated 2/25). Transferred to [facility] 2/27 with AMS, respiratory failure due to a left lung mucous plug, Intubated 2/27-3/4. PMhx: HTN, HLD, pulmonary HTN, CHF, AFib, DM, hypothyroidism, OSA   OT comments  Pt appears to have declined since evaluation. Pt limited by decreased arousal, decreased ability to follow commands and appears to have significant decline since evaluation. Pt not assisting with ROM today in BUEs or feet, occasionally turning head to sound. Pt will assist for side to side rolling as pt prefers to be on her side. [name] in room for session. VSS. 139/72 in chair position; 95-106 BPM; 3L O2 >94% O2. Pt would benefit from continued OT skilled services. OT following acutely. **[name] confirms that she wants to take pt home.    Follow Up Recommendations  SNF    Equipment Recommendations  Hospital bed;Other (comment) (hoyer lift with pad)    Recommendations for Other Services      Precautions / Restrictions Precautions Precautions: Fall;Other (comment) Precaution Comments: flexiseal Restrictions Weight Bearing Restrictions: No       Mobility Bed Mobility Overal bed mobility: Needs Assistance Bed Mobility: Rolling Rolling: Mod assist         General bed mobility comments: R side to back, back to R/L with  modA; totalA +2 for scooting to [facility]; chair position x20 mins for HEP and light ROM    Transfers                 General transfer comment: unable to attempt    Balance       Sitting balance - Comments: chair position                                   ADL either performed or assessed with clinical judgement   ADL Overall ADL's : Needs assistance/impaired                                      Functional mobility during ADLs: Total assistance;+2 for physical assistance;+2 for safety/equipment;Cueing for safety General ADL Comments: Pt limited by decreased arousal, decreased ability to follow commands and appears to have significant decline since evaluation. Pt not assisting with ROM today. Pt will assist for side to side rolling as pt prefers to be on her side. Daughter at bedside, but pt only opening eyes to sound.     Vision       Perception     Praxis      Cognition Arousal/Alertness: Lethargic Behavior During Therapy: Flat affect Overall Cognitive Status: Difficult to assess                                 General Comments: Pt not following commands; not vocalizing; pt opens eyes to voice or her name.        Exercises Exercises: Other exercises Other Exercises Other Exercises: seated PROM in chair position shoulder flex, shoulder circumduction and elbow flex/ext 10 reps, 2 sets Other Exercises: cervical rotation   Shoulder Instructions       General Comments  Pertinent Vitals/ Pain       Pain Assessment: Faces Faces Pain Scale: No hurt  Home Living                                          Prior Functioning/Environment              Frequency  Min 2X/week        Progress Toward Goals  OT Goals(current goals can now be found in the care plan section)  Progress towards OT goals: Progressing toward goals  Acute Rehab OT Goals Patient Stated Goal: for pt to regain strength (per daughter) OT Goal Formulation: With family Time For Goal Achievement: 09/19/20 Potential to Achieve Goals: Good ADL Goals Pt Will Perform Grooming: with mod assist;sitting Pt Will Perform Upper Body Bathing: with max assist;sitting Pt/caregiver will Perform Home Exercise Program: Increased ROM;Increased strength;Right Upper extremity;Left upper extremity;With written HEP provided;With  Supervision Additional ADL Goal #1: Pt will actively participate in 15 mins therapeutic activity with min cues and no more than 2 rest breaks Additional ADL Goal #2: Pt will follow one step simple commands consistently during familiar ADL activities  Plan Discharge plan remains appropriate    Co-evaluation                 AM-PAC OT "6 Clicks" Daily Activity     Outcome Measure   Help from another person eating meals?: Total Help from another person taking care of personal grooming?: Total Help from another person toileting, which includes using toliet, bedpan, or urinal?: Total Help from another person bathing (including washing, rinsing, drying)?: Total Help from another person to put on and taking off regular upper body clothing?: Total Help from another person to put on and taking off regular lower body clothing?: Total 6 Click Score: 6    End of Session Equipment Utilized During Treatment: Oxygen  OT Visit Diagnosis: Unsteadiness on feet (R26.81);Cognitive communication deficit (R41.841)   Activity Tolerance Patient limited by fatigue;Patient limited by lethargy   Patient Left in bed;with call bell/phone within reach;with family/visitor present   Nurse Communication Mobility status;Need for lift equipment        Time: [phone] OT Time Calculation (min): 45 min  Charges: OT General Charges $OT Visit: 1 Visit OT Treatments $Self Care/Home Management : 8-22 mins $Therapeutic Activity: 23-37 mins  Flora Lipps, OTR/L Acute Rehabilitation Services Pager: [phone] Office: [phone]    Shanika Levings C 09/09/2020, 10:22 AM

## 2020-09-09 NOTE — TOC Initial Note (Signed)
Transition of Care [facility]) - Initial/Assessment Note    Patient Details  Name: [name] MRN: [id number] Date of Birth: 07/20/1950  Transition of Care [facility]) CM/SW Contact:    Terrial Rhodes, LCSWA Phone Number: 09/09/2020, 4:08 PM  Clinical Narrative:                  CSW received consult for possible SNF placement at time of discharge. CSW spoke with patients daughter [name] regarding PT recommendation of SNF placement at time of discharge. Patients daughter expressed understanding of PT recommendation and declined SNF placement at time of discharge. Patients daughter would like for patient to go home with home health services. Patient comes from home with daughter and spouse. Patients daughter reports that patient will have 24/7 supervision at home by daughter and spouse.Paitents daughter says patient has used [place] home health agency before. CSW let patients daughter know that Case manager will follow up with her to help set up home health services for patient. CSW informed case Production designer, theatre/television/film.No further questions reported at this time. CSW to continue to follow and assist with discharge planning needs.  Expected Discharge Plan: Home w Home Health Services Barriers to Discharge: Continued Medical Work up   Patient Goals and CMS Choice   CMS Medicare.gov Compare Post Acute Care list provided to:: Patient Represenative (must comment) [name] Daughter) Choice offered to / list presented to : Adult Children [name])  Expected Discharge Plan and Services Expected Discharge Plan: Home w Home Health Services In-house Referral: Clinical Social Work     Living arrangements for the past 2 months: Single Family Home                             HH Agency: [facility]        Prior Living Arrangements/Services Living arrangements for the past 2 months: Single Family Home Lives with:: Self,Adult Children,Spouse (lives with daughter and spouse) Patient language and need for  interpreter reviewed:: Yes Do you feel safe going back to the place where you live?: Yes      Need for Family Participation in Patient Care: Yes (Comment) Care giver support system in place?: Yes (comment)   Criminal Activity/Legal Involvement Pertinent to Current Situation/Hospitalization: No - Comment as needed  Activities of Daily Living Home Assistive Devices/Equipment: None ADL Screening (condition at time of admission) Patient's cognitive ability adequate to safely complete daily activities?: No Is the patient deaf or have difficulty hearing?: No Does the patient have difficulty seeing, even when wearing glasses/contacts?: No Does the patient have difficulty concentrating, remembering, or making decisions?: Yes Patient able to express need for assistance with ADLs?: No Does the patient have difficulty dressing or bathing?: Yes Independently performs ADLs?: No Communication: Dependent Is this a change from baseline?: Pre-admission baseline Dressing (OT): Dependent Is this a change from baseline?: Pre-admission baseline Grooming: Dependent Is this a change from baseline?: Pre-admission baseline Feeding: Dependent Is this a change from baseline?: Pre-admission baseline Bathing: Dependent Is this a change from baseline?: Pre-admission baseline Toileting: Dependent Is this a change from baseline?: Pre-admission baseline In/Out Bed: Dependent Walks in Home: Needs assistance Is this a change from baseline?: Pre-admission baseline Does the patient have difficulty walking or climbing stairs?: Yes Weakness of Legs: Both Weakness of Arms/Hands: Both  Permission Sought/Granted Permission sought to share information with : Case Manager,Family Electrical engineer Permission granted to share information with : Yes, Verbal Permission Granted  Share Information with NAME: [name]  Permission granted to share info w AGENCY: [facility]  Permission granted to share info w  Relationship: daughter  Permission granted to share info w Contact Information: [name] [phone]  Emotional Assessment       Orientation: :  (unable to follow commands) Alcohol / Substance Use: Not Applicable Psych Involvement: No (comment)  Admission diagnosis:  Unresponsive [R41.89] Respiratory failure with hypoxia (HCC) [J96.91] Acute respiratory failure with hypoxia (HCC) [J96.01] Patient Active Problem List   Diagnosis Date Noted  . Acute metabolic encephalopathy 08/30/2020  . Acute ischemic stroke (HCC)   . Respiratory failure with hypoxia (HCC) 08/24/2020  . Lobar pneumonia (HCC) 08/24/2020  . Septic shock (HCC) 08/24/2020  . NSTEMI (non-ST elevated myocardial infarction) (HCC) 08/17/2020  . Constipation 03/17/2020  . Knee pain 10/07/2019  . Fibromyalgia 10/07/2019  . Polyarthropathy 10/07/2019  . Fibromyositis 09/30/2019  . Abnormal gait due to muscle weakness 09/30/2019  . Pain in right knee 09/30/2019  . Depression 08/31/2018  . Anxiety 08/31/2018  . Chronic respiratory failure with hypoxia and hypercapnia (HCC) 12/13/2017  . Acute systolic HF (heart failure) (HCC) 11/26/2017  . Palliative care by specialist   . Goals of care, counseling/discussion   . Acute on chronic diastolic CHF (congestive heart failure) (HCC) 08/17/2017  . Acute respiratory failure with hypoxia and hypercapnia (HCC) 08/16/2017  . Uncontrolled type 2 diabetes mellitus with hyperglycemia (HCC) 08/16/2017  . Acute respiratory failure (HCC)   . CAP (community acquired pneumonia) 08/05/2017  . COPD exacerbation (HCC) 08/05/2017  . Pulmonary hypertension (HCC) 04/05/2017  . Derangement of posterior horn of medial meniscus of right knee   . Lateral meniscus, anterior horn derangement, right   . Syncope, near 12/26/2015  . Fall at home 12/26/2015  . Dysphagia 12/07/2015  . IDA (iron deficiency anemia) 03/13/2015  . Chronic diastolic CHF (congestive heart failure) (HCC) 02/20/2015  . Diffuse  abdominal pain 09/16/2014  . Ascites 08/27/2014  . Anemia 07/20/2014  . Epistaxis, recurrent 07/20/2014  . Seizure (HCC) 04/11/2014  . Dementia (HCC) 04/11/2014  . Chronic diarrhea 04/11/2014  . DM type 2 (diabetes mellitus, type 2) (HCC) 04/11/2014  . Protein-calorie malnutrition, severe (HCC) 04/11/2014  . Abnormal weight loss 08/26/2013  . Lower abdominal pain 08/26/2013  . Anorexia nervosa 08/26/2013  . Encounter for therapeutic drug monitoring 08/26/2013  . DOE (dyspnea on exertion) 06/29/2013  . Tremor 10/27/2012  . Chronic anticoagulation 06/23/2011  . HYPOTENSION, ORTHOSTATIC 08/26/2009  . Hyperlipidemia 03/04/2009  . Barrett's esophagus 09/26/2008  . Gastroparesis 09/26/2008  . Iron deficiency anemia 09/25/2008  . Essential hypertension 09/25/2008  . LUNG NODULE 09/25/2008  . Irritable bowel syndrome 09/25/2008  . Sleep apnea 09/25/2008  . CARPAL TUNNEL SYNDROME, HX OF 09/25/2008  . Hypothyroidism 04/10/2008  . Body mass index (BMI) 40.0-44.9, adult (HCC) 04/10/2008  . Atrial fibrillation (HCC) 04/10/2008  . Congestive heart failure (HCC) 04/10/2008  . Low back pain 04/10/2008  . CHEST PAIN-PRECORDIAL 04/10/2008  . Obesity 04/10/2008   PCP:  Gareth Morgan, MD Pharmacy:   [facility] - [place], [place] - [address] [place] [address] Phone: [phone] Fax: [phone]     Social Determinants of Health (SDOH) Interventions    Readmission Risk Interventions No flowsheet data found.

## 2020-09-09 NOTE — Progress Notes (Signed)
PROGRESS NOTE    [name]  BBC:[id number] DOB: 02/21/1951 DOA: 08/23/2020 PCP: Lemmie Evens, MD    Brief Narrative: This 70 y/ofemale admitted to [facility] on 08/23/20 in the setting of septic shock from pneumonia requiring intubation, She had atrial fibrillation at baseline and had been on amiodarone, this was held for concerns of possible amiodarone induced lung toxicity and she was treated with steroids. She was extubated on 08/28/20. However, her mental status declined overnight from 2/26 through early 2/27. A blood gas was obtained which did not show hypercarbia. However after the blood gas was performed patient has worsening of hypoxia and mental status. In consultation with the pulmonary critical care service in [place] it was decided that she should be electively intubated and then transferred to [facility] for  bronchoscopy.  She is s/p bronchoscopy for mucus plugging, she finished abx treatment, then extubated on 3/4, transferred to hospitalist service on 3/6.  She appears to be extremely drowsy, does not follow commands, opens eyes on calling her name. Neurology consulted. MRI unremarkable.  Patient is having delirium.  Significant Hospital Events:  2/20 Admitted to [facility], intubated 2/22 Stop amiodarone (esr 95) 2/23 Solumedrol trial @ 80 q 12 and reduced to 40 mg q 12h Pm 2/25  2/25 Extubated 2/26 Worsening mental status/respiratory failure 2/27 Transferred to [facility], reintubated, bronched 2/28 Decreasing vent settings 3/01 Failed vent wean 2/2 increased HR/RR 3/4   Extubated , bipap q hs  Assessment & Plan:   Principal Problem:   Lobar pneumonia ([place]) Active Problems:   Hypothyroidism   Hyperlipidemia   Essential hypertension   Atrial fibrillation (HCC)   Congestive heart failure (HCC)   Gastroparesis   Seizure ([place])   Dementia ([place])   DM type 2 (diabetes mellitus, type 2) ([place])   Acute respiratory failure (HCC)   Respiratory failure with hypoxia ([place])    Fibromyalgia   Septic shock ([place])   Acute ischemic stroke ([place])   Acute metabolic encephalopathy  Acute metabolic encephalopathy :  Fluctuating -Daughter reports at baseline, She is alert and independent. -She has been very drowsy , has waxing and waning in her mental status. -ABG showed no PCo2 retention, CT head no acute finding, EEG : No evidence of seizures. There is  mild hypernatremia, Delirium could be possibility.  UA unremarkable. -Cortack inserted for nutrition. changed meds to IV or subQ, changed back to po through tube. -Monitor sodium level. 150 >149 > 145 -Neuro checks every 4 hours. -Neurology consulted for further evaluation.  MRI unremarkable. -Neurology recommended to continue supportive care.  Patient is having delirium.  Acute hypoxic respiratory failure due to left mainstem mucous pluging Recent healthcare associated pneumonia:  She has recently finished course of antibiotics Zosyn on 3/2. S/p bronchoscopy on 2/27 with total occlusion of L mainstem with mucous plug, sent for Cx. Continue BiPAP nightly and as needed. Continue Pulmonary toilet (hypertonic saline, guaifenesin,chest PT via bed ). Chest x-ray no infiltrate but consistent with pulmonary interstitial edema.  Chronic A. Fib : Her heart rate remains controlled, amiodarone been resumed, no evidence of fibrosis on CT scan. She was on heparin drip then apixaban, changed to Lovenox subcu on 3/6 due to acute metabolic encephalopathy  Lovenox switched to Eliquis through tube.  Chronic diastolic CHF  Echo 5/03 with EF of 50-55%. Currently does not appear volume overloaded with poor oral intake. Resume Lasix.  Hypokalemia : -Replace K as needed.  Hypernatremia > Improved.  Na 150> 149> 145 Continue free  water flush through core track tube if not able to take PO.  Insulin-dependent type 2 diabetes Uncontrolled, A1c 7.9 Continue insulin SS, adjust as needed.  CKD 2/anemia of chronic  disease Renal function and hemoglobin appear close to baseline. Monitor  Renal functions  History of seizures Continue Keppra 1000 mg twice daily through tube. EEG completed, no evidence of seizures.  Hypothyroidism Resume Synthroid   Dementia Continue Aricept. Daughter wants Cymbalta to be started.   Obesity: Body mass index is 36.08 kg/m. outpatient PCP follow up.   DVT prophylaxis: Eliquis Code Status: Full code Family Communication: Spoke with Daughter Disposition Plan:   Status is: Inpatient  Remains inpatient appropriate because:Inpatient level of care appropriate due to severity of illness  Dispo: The patient is from: Home              Anticipated d/c is to: TBD              Patient currently is not medically stable to d/c.   Difficult to place patient No  Consultants:   Pulm/ Critical care.  Procedures:  ETT 2/20 >>2/25, 2/27 >>3/4, tube feeding while on vent R IJ CVL 2/21 >>   Antimicrobials:  Anti-infectives (From admission, onward)   Start     Dose/Rate Route Frequency Ordered Stop   08/28/20 1900  vancomycin (VANCOREADY) IVPB 1250 mg/250 mL  Status:  Discontinued        1,250 mg 166.7 mL/hr over 90 Minutes Intravenous Every 24 hours 08/27/20 1820 08/28/20 1204   08/27/20 2200  piperacillin-tazobactam (ZOSYN) IVPB 3.375 g        3.375 g 12.5 mL/hr over 240 Minutes Intravenous Every 8 hours 08/27/20 1412 09/03/20 0141   08/27/20 1915  vancomycin (VANCOREADY) IVPB 2000 mg/400 mL        2,000 mg 200 mL/hr over 120 Minutes Intravenous  Once 08/27/20 1816 08/27/20 2156   08/27/20 1430  piperacillin-tazobactam (ZOSYN) IVPB 3.375 g        3.375 g 100 mL/hr over 30 Minutes Intravenous  Once 08/27/20 1412 08/27/20 1502   08/27/20 1000  ceFEPIme (MAXIPIME) 2 g in sodium chloride 0.9 % 100 mL IVPB  Status:  Discontinued        2 g 200 mL/hr over 30 Minutes Intravenous Every 12 hours 08/27/20 0744 08/27/20 1412   08/26/20 1000  ceFEPIme (MAXIPIME)  2 g in sodium chloride 0.9 % 100 mL IVPB  Status:  Discontinued        2 g 200 mL/hr over 30 Minutes Intravenous Every 8 hours 08/26/20 0848 08/27/20 0744   08/25/20 1200  vancomycin (VANCOCIN) IVPB 1000 mg/200 mL premix  Status:  Discontinued        1,000 mg 200 mL/hr over 60 Minutes Intravenous Every 24 hours 08/24/20 1043 08/24/20 1452   08/24/20 1200  vancomycin (VANCOREADY) IVPB 2000 mg/400 mL  Status:  Discontinued        2,000 mg 200 mL/hr over 120 Minutes Intravenous  Once 08/24/20 1009 08/24/20 1452   08/24/20 1045  ceFEPIme (MAXIPIME) 2 g in sodium chloride 0.9 % 100 mL IVPB  Status:  Discontinued        2 g 200 mL/hr over 30 Minutes Intravenous Every 12 hours 08/24/20 0958 08/26/20 0848   08/24/20 0100  ceFEPIme (MAXIPIME) 2 g in sodium chloride 0.9 % 100 mL IVPB        2 g 200 mL/hr over 30 Minutes Intravenous  Once 08/24/20 0047 08/24/20 0219  Subjective: Patient was seen and examined at bedside.  Overnight events noted.  Patient seems very lethargic today.  Not following commands.  EEG completed, no evidence of seizures.  Daughter is at bedside, MRI unremarkable.  UA is negative.  Objective: Vitals:   09/09/20 0900 09/09/20 1000 09/09/20 1100 09/09/20 1136  BP: 139/72 117/68    Pulse: 97 88 82   Resp: (!) 26 (!) 32 (!) 39   Temp:    98.3 F (36.8 C)  TempSrc:    Oral  SpO2: 92% 97% 95%   Weight:      Height:        Intake/Output Summary (Last 24 hours) at 09/09/2020 1156 Last data filed at 09/09/2020 1100 Gross per 24 hour  Intake 2045.03 ml  Output -  Net 2045.03 ml   Filed Weights   09/07/20 0351 09/08/20 0402 09/09/20 0417  Weight: 100.3 kg 99.1 kg 98.8 kg    Examination:  General exam: Appears calm and comfortable, Lethargic , not following commands. Respiratory system: Crackles to auscultation. Respiratory effort normal. Cardiovascular system: S1 & S2 heard, RRR. No JVD, murmurs, rubs, gallops or clicks. No pedal edema. Gastrointestinal system:  Abdomen is nondistended, soft and nontender. No organomegaly or masses felt.  Normal bowel sounds heard. Central nervous system: Drowsy,  Not assessed. Extremities:  No edema, no cyanosis, no clubbing Skin: No rashes, lesions or ulcers Psychiatry: Judgement and insight appear normal. Mood & affect appropriate.     Data Reviewed: I have personally reviewed following labs and imaging studies  CBC: Recent Labs  Lab 09/05/20 0418 09/06/20 0342 09/06/20 1238 09/07/20 0114 09/08/20 0440 09/09/20 0117  WBC 10.4 8.9  --  8.4 7.4 6.0  HGB 8.3* 8.6* 9.2* 8.6* 8.8* 8.5*  HCT 26.5* 28.1* 27.0* 29.4* 28.4* 27.6*  MCV 107.7* 108.5*  --  109.7* 107.2* 108.2*  PLT 162 182  --  176 195 364   Basic Metabolic Panel: Recent Labs  Lab 09/05/20 0418 09/06/20 0342 09/06/20 1238 09/07/20 0114 09/08/20 0440 09/09/20 0117  NA 149* 150* 153* 150* 149* 145  K 3.5 3.7 3.4* 4.0 3.0* 3.9  CL 115* 113*  --  116* 116* 115*  CO2 24 23  --  23 23 21*  GLUCOSE 168* 178*  --  144* 266* 282*  BUN 23 27*  --  28* 27* 28*  CREATININE 0.84 0.97  --  1.17* 1.10* 1.07*  CALCIUM 9.4 9.4  --  9.1 9.4 9.0  MG 2.0  --   --  2.1 2.1  --   PHOS 3.5  --   --   --  3.5  --    GFR: Estimated Creatinine Clearance: 57.2 mL/min (A) (by C-G formula based on SCr of 1.07 mg/dL (H)). Liver Function Tests: Recent Labs  Lab 09/08/20 0440  AST 17  ALT 19  ALKPHOS 50  BILITOT 1.0  PROT 5.8*  ALBUMIN 2.7*   No results for input(s): LIPASE, AMYLASE in the last 168 hours. No results for input(s): AMMONIA in the last 168 hours. Coagulation Profile: No results for input(s): INR, PROTIME in the last 168 hours. Cardiac Enzymes: No results for input(s): CKTOTAL, CKMB, CKMBINDEX, TROPONINI in the last 168 hours. BNP (last 3 results) No results for input(s): PROBNP in the last 8760 hours. HbA1C: No results for input(s): HGBA1C in the last 72 hours. CBG: Recent Labs  Lab 09/08/20 1938 09/08/20 2325 09/09/20 0336  09/09/20 0801 09/09/20 1133  GLUCAP 221* 238* 251* 259*  224*   Lipid Profile: No results for input(s): CHOL, HDL, LDLCALC, TRIG, CHOLHDL, LDLDIRECT in the last 72 hours. Thyroid Function Tests: No results for input(s): TSH, T4TOTAL, FREET4, T3FREE, THYROIDAB in the last 72 hours. Anemia Panel: No results for input(s): VITAMINB12, FOLATE, FERRITIN, TIBC, IRON, RETICCTPCT in the last 72 hours. Sepsis Labs: No results for input(s): PROCALCITON, LATICACIDVEN in the last 168 hours.  Recent Results (from the past 240 hour(s))  Culture, Respiratory w Gram Stain     Status: None   Collection Time: 08/30/20  4:09 PM   Specimen: Tracheal Aspirate; Respiratory  Result Value Ref Range Status   Specimen Description TRACHEAL ASPIRATE  Final   Special Requests NONE  Final   Gram Stain   Final    MODERATE WBC PRESENT, PREDOMINANTLY PMN NO ORGANISMS SEEN Performed at [facility], [address], [place], [place] [address]    Culture RARE CANDIDA DUBLINIENSIS  Final   Report Status 09/03/2020 FINAL  Final    Radiology Studies: MR BRAIN WO CONTRAST  Result Date: 09/08/2020 CLINICAL DATA:  Mental status change EXAM: MRI HEAD WITHOUT CONTRAST TECHNIQUE: Multiplanar, multiecho pulse sequences of the brain and surrounding structures were obtained without intravenous contrast. COMPARISON:  08/28/2020 FINDINGS: Motion artifact is present. Brain: Punctate focus of diffusion hyperintensity on the prior study is no longer present. There is no new reduced diffusion identified. No evidence of intracranial hemorrhage. There is no intracranial mass, mass effect, or edema. There is no hydrocephalus or extra-axial fluid collection. Prominence of the ventricles and sulci reflects stable parenchymal volume loss. Patchy T2 hyperintensity in the supratentorial white matter is nonspecific but probably reflects stable chronic microvascular ischemic changes. Vascular: Major vessel flow voids at the skull base are  preserved. Skull and upper cervical spine: Normal marrow signal is preserved. Sinuses/Orbits: Minor paranasal sinus mucosal thickening. Orbits are unremarkable. Other: Sella is unremarkable.  Bilateral mastoid effusions. IMPRESSION: Motion degradation.  No acute infarction, hemorrhage, or mass. Electronically Signed   By: Macy Mis M.D.   On: 09/08/2020 17:35   DG CHEST PORT 1 VIEW  Result Date: 09/08/2020 CLINICAL DATA:  Pneumonia. EXAM: PORTABLE CHEST 1 VIEW COMPARISON:  09/05/2020. FINDINGS: Interval removal of right IJ line. Interval placement of feeding tube with tip below hemidiaphragm. Cardiomegaly. Diffuse bilateral pulmonary infiltrates/edema again noted. No interim change. Low lung volumes with bibasilar atelectasis. Small left pleural effusion cannot be excluded. No pneumothorax. IMPRESSION: 1. Interval removal of right IJ line. Interval placement of feeding tube with tip below hemidiaphragm. 2. Cardiomegaly with diffuse bilateral pulmonary infiltrates/edema again noted. No interim change. Low lung volumes with bibasilar atelectasis again noted without interim change. Small left pleural effusion cannot be excluded. Electronically Signed   By: Marcello Moores  Register   On: 09/08/2020 12:36   EEG adult  Result Date: 09/07/2020 Lora Havens, MD     09/07/2020  1:07 PM Patient Name: [name] MRN: [id number] Epilepsy Attending: Lora Havens Referring Physician/Provider: Dr. Florencia Reasons Date: 09/07/2020 Duration: 25.55 mins Patient history: 70 year old female with altered mental status. EEG to evaluate for seizures. Level of alertness: Awake AEDs during EEG study: Keppra Technical aspects: This EEG study was done with scalp electrodes positioned according to the 10-20 International system of electrode placement. Electrical activity was acquired at a sampling rate of _0  and reviewed with a high frequency filter of _1  and a low frequency filter of _2 . EEG data were recorded continuously and  digitally stored. Description: The posterior dominant  rhythm consists of 7 Hz activity of moderate voltage (25-35 uV) seen predominantly in posterior head regions, symmetric and reactive to eye opening and eye closing. EEG showed continuous generalized 3 to 6 Hz theta-delta slowing. Hyperventilation and photic stimulation were not performed.   ABNORMALITY -Continuous slow, generalized IMPRESSION: This study is suggestive of mild to moderate diffuse encephalopathy, nonspecific etiology. No seizures or epileptiform discharges were seen throughout the recording. Priyanka Barbra Sarks    Scheduled Meds: . amiodarone  200 mg Per Tube Daily  . apixaban  5 mg Per Tube BID  . aspirin  81 mg Per Tube Daily  . atorvastatin  80 mg Per Tube Daily  . chlorhexidine  15 mL Mouth Rinse BID  . Chlorhexidine Gluconate Cloth  6 each Topical Daily  . donepezil  5 mg Per Tube QHS  . feeding supplement (PROSource TF)  45 mL Per Tube BID  . free water  250 mL Per Tube Q4H  . guaiFENesin  5 mL Per Tube Q6H  . insulin aspart  0-20 Units Subcutaneous Q4H  . insulin aspart  6 Units Subcutaneous Q4H  . insulin glargine  35 Units Subcutaneous BID  . levETIRAcetam  1,000 mg Per Tube BID  . levothyroxine  175 mcg Per Tube Q0600  . mouth rinse  15 mL Mouth Rinse q12n4p  . metoprolol tartrate  12.5 mg Per Tube BID   Continuous Infusions: . sodium chloride Stopped (09/06/20 1228)  . feeding supplement (OSMOLITE 1.2 CAL) 1,000 mL (09/09/20 0418)     LOS: 16 days    Time spent: 35 mins    PARDEEP KUMAR, MD [facility]   If 7PM-7AM, please contact night-coverage

## 2020-09-09 NOTE — Progress Notes (Signed)
CSW awaiting callback from patients family to complete assessment for possible SNF placement for patient.  CSW will continue to follow.

## 2020-09-09 NOTE — Plan of Care (Signed)
  Problem: Respiratory: Goal: Ability to maintain adequate ventilation will improve Outcome: Progressing   Problem: Nutrition: Goal: Adequate nutrition will be maintained Outcome: Progressing   Problem: Elimination: Goal: Will not experience complications related to bowel motility Outcome: Progressing Goal: Will not experience complications related to urinary retention Outcome: Progressing   Problem: Activity: Goal: Ability to tolerate increased activity will improve Outcome: Not Progressing   Problem: Activity: Goal: Ability to tolerate increased activity will improve Outcome: Completed/Met   Problem: Respiratory: Goal: Ability to maintain a clear airway and adequate ventilation will improve Outcome: Completed/Met   Problem: Role Relationship: Goal: Method of communication will improve Outcome: Completed/Met

## 2020-09-10 ENCOUNTER — Inpatient Hospital Stay (HOSPITAL_COMMUNITY): Payer: PPO

## 2020-09-10 DIAGNOSIS — G9341 Metabolic encephalopathy: Secondary | ICD-10-CM | POA: Diagnosis not present

## 2020-09-10 DIAGNOSIS — J181 Lobar pneumonia, unspecified organism: Secondary | ICD-10-CM | POA: Diagnosis not present

## 2020-09-10 DIAGNOSIS — J9601 Acute respiratory failure with hypoxia: Secondary | ICD-10-CM | POA: Diagnosis not present

## 2020-09-10 LAB — BLOOD GAS, ARTERIAL
Acid-Base Excess: 2.1 mmol/L — ABNORMAL HIGH (ref 0.0–2.0)
Bicarbonate: 26.7 mmol/L (ref 20.0–28.0)
FIO2: 32
O2 Saturation: 95.4 %
Patient temperature: 36.6
pCO2 arterial: 45 mmHg (ref 32.0–48.0)
pH, Arterial: 7.388 (ref 7.350–7.450)
pO2, Arterial: 76.8 mmHg — ABNORMAL LOW (ref 83.0–108.0)

## 2020-09-10 LAB — CBC
HCT: 30.4 % — ABNORMAL LOW (ref 36.0–46.0)
Hemoglobin: 8.9 g/dL — ABNORMAL LOW (ref 12.0–15.0)
MCH: 32.2 pg (ref 26.0–34.0)
MCHC: 29.3 g/dL — ABNORMAL LOW (ref 30.0–36.0)
MCV: 110.1 fL — ABNORMAL HIGH (ref 80.0–100.0)
Platelets: 202 10*3/uL (ref 150–400)
RBC: 2.76 MIL/uL — ABNORMAL LOW (ref 3.87–5.11)
RDW: 17.9 % — ABNORMAL HIGH (ref 11.5–15.5)
WBC: 7.2 10*3/uL (ref 4.0–10.5)
nRBC: 0.3 % — ABNORMAL HIGH (ref 0.0–0.2)

## 2020-09-10 LAB — BASIC METABOLIC PANEL
Anion gap: 7 (ref 5–15)
BUN: 26 mg/dL — ABNORMAL HIGH (ref 8–23)
CO2: 26 mmol/L (ref 22–32)
Calcium: 9.1 mg/dL (ref 8.9–10.3)
Chloride: 109 mmol/L (ref 98–111)
Creatinine, Ser: 0.89 mg/dL (ref 0.44–1.00)
GFR, Estimated: >60 mL/min (ref >60)
Glucose, Bld: 198 mg/dL — ABNORMAL HIGH (ref 70–99)
Potassium: 3.8 mmol/L (ref 3.5–5.1)
Sodium: 142 mmol/L (ref 135–145)

## 2020-09-10 LAB — AMMONIA: Ammonia: 38 umol/L — ABNORMAL HIGH (ref 9–35)

## 2020-09-10 LAB — GLUCOSE, CAPILLARY
Glucose-Capillary: 159 mg/dL — ABNORMAL HIGH (ref 70–99)
Glucose-Capillary: 201 mg/dL — ABNORMAL HIGH (ref 70–99)
Glucose-Capillary: 215 mg/dL — ABNORMAL HIGH (ref 70–99)
Glucose-Capillary: 188 mg/dL — ABNORMAL HIGH (ref 70–99)
Glucose-Capillary: 196 mg/dL — ABNORMAL HIGH (ref 70–99)
Glucose-Capillary: 198 mg/dL — ABNORMAL HIGH (ref 70–99)

## 2020-09-10 LAB — MAGNESIUM: Magnesium: 2.1 mg/dL (ref 1.7–2.4)

## 2020-09-10 LAB — PHOSPHORUS: Phosphorus: 4.3 mg/dL (ref 2.5–4.6)

## 2020-09-10 MED ORDER — ACETYLCYSTEINE 20 % IN SOLN
4.0000 mL | Freq: Three times a day (TID) | RESPIRATORY_TRACT | Status: AC
  Administered 2020-09-10 – 2020-09-11 (×6): 4 mL via RESPIRATORY_TRACT
  Filled 2020-09-10 (×6): qty 4

## 2020-09-10 MED ORDER — DULOXETINE HCL 30 MG PO CPEP
30.0000 mg | ORAL_CAPSULE | Freq: Every day | ORAL | Status: DC
Start: 2020-09-11 — End: 2020-09-23
  Administered 2020-09-11 – 2020-09-22 (×12): 30 mg via ORAL
  Filled 2020-09-10 (×12): qty 1

## 2020-09-10 NOTE — Progress Notes (Signed)
PROGRESS NOTE    [name]  GNF:[id number] DOB: 08-08-1950 DOA: 08/23/2020 PCP: Lemmie Evens, MD    Brief Narrative: This 48 y/ofemale admitted to [facility] on 08/23/20 in the setting of septic shock from pneumonia requiring intubation, She had atrial fibrillation at baseline and had been on amiodarone, this was held for concerns of possible amiodarone induced lung toxicity and she was treated with steroids. She was extubated on 08/28/20. However, her mental status declined overnight from 2/26 through early 2/27. A blood gas was obtained which did not show hypercarbia. However after the blood gas was performed patient has worsening of hypoxia and mental status. In consultation with the pulmonary critical care service in [place] it was decided that she should be electively intubated and then transferred to [facility] for  bronchoscopy.  She is s/p bronchoscopy for mucus plugging, she finished abx treatment, then extubated on 3/4, transferred to hospitalist service on 3/6.  She appears to be extremely drowsy, does not follow commands, opens eyes on calling her name. Neurology consulted. MRI unremarkable.  Patient is having delirium.  Significant Hospital Events:  2/20 Admitted to [facility], intubated 2/22 Stop amiodarone (esr 95) 2/23 Solumedrol trial @ 80 q 12 and reduced to 40 mg q 12h Pm 2/25  2/25 Extubated 2/26 Worsening mental status/respiratory failure 2/27 Transferred to [facility], reintubated, bronched 2/28 Decreasing vent settings 3/01 Failed vent wean 2/2 increased HR/RR 3/4   Extubated , bipap q hs  Assessment & Plan:   Principal Problem:   Lobar pneumonia ([place]) Active Problems:   Hypothyroidism   Hyperlipidemia   Essential hypertension   Atrial fibrillation (HCC)   Congestive heart failure (HCC)   Gastroparesis   Seizure ([place])   Dementia ([place])   DM type 2 (diabetes mellitus, type 2) ([place])   Acute respiratory failure (HCC)   Respiratory failure with hypoxia ([place])    Fibromyalgia   Septic shock ([place])   Acute ischemic stroke ([place])   Acute metabolic encephalopathy  Acute metabolic encephalopathy :  Fluctuating -Daughter reports at baseline, She is alert and independent. -She has been very drowsy , has waxing and waning in her mental status. -ABG showed no PCo2 retention, CT head no acute finding, EEG : No evidence of seizures. There is  mild hypernatremia, Delirium could be possibility.  UA unremarkable. -Cortack inserted for nutrition. changed meds to IV or subQ, changed back to po through tube. -Monitor sodium level. 150 >149 > 145 -Neuro checks every 4 hours. -Neurology consulted for further evaluation.  MRI unremarkable. -Neurology recommended to continue supportive care.  Patient is having delirium. -Pulmonology suspect ICU delirium superimposed on underlying dementia. -Continue to hold sedating medications. -Repeat ABG does not show PCO2 retention.  Acute hypoxic respiratory failure due to left mainstem mucous pluging Recent healthcare associated pneumonia:  She has recently finished course of antibiotics Zosyn on 3/2. S/p bronchoscopy on 2/27 with total occlusion of L mainstem with mucous plug, sent for Cx. Continue BiPAP nightly and as needed. Continue Pulmonary toilet (hypertonic saline, guaifenesin,chest PT via bed ). Chest x-ray no infiltrate but consistent with pulmonary interstitial edema. -Patient is at very high risk for reintubation.  Chronic A. Fib : Her heart rate remains controlled, amiodarone been resumed, no evidence of fibrosis on CT scan. She was on heparin drip then apixaban, Lovenox switched to Eliquis through tube.  Chronic diastolic CHF  Echo 8/46 with EF of 50-55%. Currently does not appear volume overloaded with poor oral intake. Resume Lasix.  Hypokalemia : -  Replace K as needed.  Hypernatremia > Improved.  Na 150> 149> 145 Continue free water flush through core track tube if not able to take  PO.  Insulin-dependent type 2 diabetes Uncontrolled, A1c 7.9 Continue insulin SS, adjust as needed.  CKD 2/anemia of chronic disease Renal function and hemoglobin appear close to baseline. Monitor  Renal functions  History of seizures Continue Keppra 1000 mg twice daily through tube. EEG completed, no evidence of seizures.  Hypothyroidism Resume Synthroid   Dementia Continue Aricept. Daughter wants Cymbalta to be started.   Obesity: Body mass index is 36.08 kg/m. outpatient PCP follow up.   DVT prophylaxis: Eliquis Code Status: Full code Family Communication: Spoke with Daughter Disposition Plan:   Status is: Inpatient  Remains inpatient appropriate because:Inpatient level of care appropriate due to severity of illness  Dispo: The patient is from: Home              Anticipated d/c is to: TBD              Patient currently is not medically stable to d/c.   Difficult to place patient No  Consultants:   Pulm/ Critical care.  Procedures:  ETT 2/20 >>2/25, 2/27 >>3/4, tube feeding while on vent R IJ CVL 2/21 >>   Antimicrobials:  Anti-infectives (From admission, onward)   Start     Dose/Rate Route Frequency Ordered Stop   08/28/20 1900  vancomycin (VANCOREADY) IVPB 1250 mg/250 mL  Status:  Discontinued        1,250 mg 166.7 mL/hr over 90 Minutes Intravenous Every 24 hours 08/27/20 1820 08/28/20 1204   08/27/20 2200  piperacillin-tazobactam (ZOSYN) IVPB 3.375 g        3.375 g 12.5 mL/hr over 240 Minutes Intravenous Every 8 hours 08/27/20 1412 09/03/20 0141   08/27/20 1915  vancomycin (VANCOREADY) IVPB 2000 mg/400 mL        2,000 mg 200 mL/hr over 120 Minutes Intravenous  Once 08/27/20 1816 08/27/20 2156   08/27/20 1430  piperacillin-tazobactam (ZOSYN) IVPB 3.375 g        3.375 g 100 mL/hr over 30 Minutes Intravenous  Once 08/27/20 1412 08/27/20 1502   08/27/20 1000  ceFEPIme (MAXIPIME) 2 g in sodium chloride 0.9 % 100 mL IVPB  Status:   Discontinued        2 g 200 mL/hr over 30 Minutes Intravenous Every 12 hours 08/27/20 0744 08/27/20 1412   08/26/20 1000  ceFEPIme (MAXIPIME) 2 g in sodium chloride 0.9 % 100 mL IVPB  Status:  Discontinued        2 g 200 mL/hr over 30 Minutes Intravenous Every 8 hours 08/26/20 0848 08/27/20 0744   08/25/20 1200  vancomycin (VANCOCIN) IVPB 1000 mg/200 mL premix  Status:  Discontinued        1,000 mg 200 mL/hr over 60 Minutes Intravenous Every 24 hours 08/24/20 1043 08/24/20 1452   08/24/20 1200  vancomycin (VANCOREADY) IVPB 2000 mg/400 mL  Status:  Discontinued        2,000 mg 200 mL/hr over 120 Minutes Intravenous  Once 08/24/20 1009 08/24/20 1452   08/24/20 1045  ceFEPIme (MAXIPIME) 2 g in sodium chloride 0.9 % 100 mL IVPB  Status:  Discontinued        2 g 200 mL/hr over 30 Minutes Intravenous Every 12 hours 08/24/20 0958 08/26/20 0848   08/24/20 0100  ceFEPIme (MAXIPIME) 2 g in sodium chloride 0.9 % 100 mL IVPB        2  g 200 mL/hr over 30 Minutes Intravenous  Once 08/24/20 0047 08/24/20 7062       Subjective: Patient was seen and examined at bedside.  Overnight events noted.  Patient continues to remain lethargic, not following commands.  Daughter is at bedside, MRI unremarkable.  UA is negative.  Pulmonology suspect ICU delirium over underlying dementia.  Objective: Vitals:   09/10/20 0800 09/10/20 0918 09/10/20 1000 09/10/20 1200  BP: 110/87 (!) 136/95 (!) 117/59   Pulse: (!) 101 86 78   Resp: (!) 27  (!) 29   Temp: 97.9 F (36.6 C)   98.1 F (36.7 C)  TempSrc: Oral   Oral  SpO2: 94%  100%   Weight:      Height:        Intake/Output Summary (Last 24 hours) at 09/10/2020 1203 Last data filed at 09/10/2020 1000 Gross per 24 hour  Intake 1780 ml  Output 2000 ml  Net -220 ml   Filed Weights   09/08/20 0402 09/09/20 0417 09/10/20 0200  Weight: 99.1 kg 98.8 kg 100.4 kg    Examination:  General exam: Appears calm and comfortable, Lethargic , not following  commands. Respiratory system: Crackles to auscultation. Respiratory effort normal. Cardiovascular system: S1 & S2 heard, RRR. No JVD, murmurs, rubs, gallops or clicks. No pedal edema. Gastrointestinal system: Abdomen is nondistended, soft and nontender. No organomegaly or masses felt.  Normal bowel sounds heard. Central nervous system: Drowsy,  Not assessed. Extremities:  No edema, no cyanosis, no clubbing Skin: No rashes, lesions or ulcers Psychiatry: Judgement and insight appear normal. Mood & affect appropriate.     Data Reviewed: I have personally reviewed following labs and imaging studies  CBC: Recent Labs  Lab 09/06/20 0342 09/06/20 1238 09/07/20 0114 09/08/20 0440 09/09/20 0117 09/10/20 0139  WBC 8.9  --  8.4 7.4 6.0 7.2  HGB 8.6* 9.2* 8.6* 8.8* 8.5* 8.9*  HCT 28.1* 27.0* 29.4* 28.4* 27.6* 30.4*  MCV 108.5*  --  109.7* 107.2* 108.2* 110.1*  PLT 182  --  176 195 198 376   Basic Metabolic Panel: Recent Labs  Lab 09/05/20 0418 09/06/20 0342 09/06/20 1238 09/07/20 0114 09/08/20 0440 09/09/20 0117 09/10/20 0139  NA 149* 150* 153* 150* 149* 145 142  K 3.5 3.7 3.4* 4.0 3.0* 3.9 3.8  CL 115* 113*  --  116* 116* 115* 109  CO2 24 23  --  23 23 21* 26  GLUCOSE 168* 178*  --  144* 266* 282* 198*  BUN 23 27*  --  28* 27* 28* 26*  CREATININE 0.84 0.97  --  1.17* 1.10* 1.07* 0.89  CALCIUM 9.4 9.4  --  9.1 9.4 9.0 9.1  MG 2.0  --   --  2.1 2.1  --  2.1  PHOS 3.5  --   --   --  3.5  --  4.3   GFR: Estimated Creatinine Clearance: 69.3 mL/min (by C-G formula based on SCr of 0.89 mg/dL). Liver Function Tests: Recent Labs  Lab 09/08/20 0440  AST 17  ALT 19  ALKPHOS 50  BILITOT 1.0  PROT 5.8*  ALBUMIN 2.7*   No results for input(s): LIPASE, AMYLASE in the last 168 hours. Recent Labs  Lab 09/10/20 0934  AMMONIA 38*   Coagulation Profile: No results for input(s): INR, PROTIME in the last 168 hours. Cardiac Enzymes: No results for input(s): CKTOTAL, CKMB,  CKMBINDEX, TROPONINI in the last 168 hours. BNP (last 3 results) No results for input(s): PROBNP in  the last 8760 hours. HbA1C: No results for input(s): HGBA1C in the last 72 hours. CBG: Recent Labs  Lab 09/09/20 1936 09/09/20 2323 09/10/20 0327 09/10/20 0742 09/10/20 1148  GLUCAP 212* 168* 159* 201* 215*   Lipid Profile: No results for input(s): CHOL, HDL, LDLCALC, TRIG, CHOLHDL, LDLDIRECT in the last 72 hours. Thyroid Function Tests: No results for input(s): TSH, T4TOTAL, FREET4, T3FREE, THYROIDAB in the last 72 hours. Anemia Panel: No results for input(s): VITAMINB12, FOLATE, FERRITIN, TIBC, IRON, RETICCTPCT in the last 72 hours. Sepsis Labs: No results for input(s): PROCALCITON, LATICACIDVEN in the last 168 hours.  No results found for this or any previous visit (from the past 240 hour(s)).  Radiology Studies: MR BRAIN WO CONTRAST  Result Date: 09/08/2020 CLINICAL DATA:  Mental status change EXAM: MRI HEAD WITHOUT CONTRAST TECHNIQUE: Multiplanar, multiecho pulse sequences of the brain and surrounding structures were obtained without intravenous contrast. COMPARISON:  08/28/2020 FINDINGS: Motion artifact is present. Brain: Punctate focus of diffusion hyperintensity on the prior study is no longer present. There is no new reduced diffusion identified. No evidence of intracranial hemorrhage. There is no intracranial mass, mass effect, or edema. There is no hydrocephalus or extra-axial fluid collection. Prominence of the ventricles and sulci reflects stable parenchymal volume loss. Patchy T2 hyperintensity in the supratentorial white matter is nonspecific but probably reflects stable chronic microvascular ischemic changes. Vascular: Major vessel flow voids at the skull base are preserved. Skull and upper cervical spine: Normal marrow signal is preserved. Sinuses/Orbits: Minor paranasal sinus mucosal thickening. Orbits are unremarkable. Other: Sella is unremarkable.  Bilateral mastoid  effusions. IMPRESSION: Motion degradation.  No acute infarction, hemorrhage, or mass. Electronically Signed   By: Macy Mis M.D.   On: 09/08/2020 17:35   DG Chest Port 1 View  Result Date: 09/10/2020 CLINICAL DATA:  Respiratory failure EXAM: PORTABLE CHEST 1 VIEW COMPARISON:  September 08, 2020 FINDINGS: Enteric tube tip is below the diaphragm. No pneumothorax. There is airspace opacity in the right upper lobe and left lower lobe regions, most likely representing multifocal pneumonia. Small left pleural effusion. There is a degree of interstitial edema as well. There is cardiomegaly with pulmonary venous hypertension. No adenopathy. There is aortic atherosclerosis. There is mitral annulus calcification. No bone lesions. IMPRESSION: Enteric tube as described. There is airspace opacity in the right upper lobe and left base regions which are suspicious for multifocal pneumonia. Pulmonary edema could present in this manner and is a differential consideration for these airspace opacities. Elsewhere, there is cardiomegaly with pulmonary venous hypertension and mild interstitial edema. Small left pleural effusion. Suspect a degree of underlying congestive heart failure. Aortic Atherosclerosis (ICD10-I70.0). Electronically Signed   By: Lowella Grip III M.D.   On: 09/10/2020 10:23    Scheduled Meds: . acetylcysteine  4 mL Nebulization TID  . amiodarone  200 mg Per Tube Daily  . apixaban  5 mg Per Tube BID  . aspirin  81 mg Per Tube Daily  . atorvastatin  80 mg Per Tube Daily  . chlorhexidine  15 mL Mouth Rinse BID  . Chlorhexidine Gluconate Cloth  6 each Topical Daily  . donepezil  5 mg Per Tube QHS  . DULoxetine  30 mg Oral BID  . feeding supplement (PROSource TF)  45 mL Per Tube BID  . furosemide  40 mg Oral Daily  . guaiFENesin  5 mL Per Tube Q6H  . insulin aspart  0-20 Units Subcutaneous Q4H  . insulin aspart  6 Units Subcutaneous  Q4H  . insulin glargine  35 Units Subcutaneous BID  .  levETIRAcetam  1,000 mg Per Tube BID  . levothyroxine  175 mcg Per Tube Q0600  . mouth rinse  15 mL Mouth Rinse q12n4p  . metoprolol tartrate  12.5 mg Per Tube BID   Continuous Infusions: . sodium chloride Stopped (09/06/20 1228)  . feeding supplement (OSMOLITE 1.2 CAL) 1,000 mL (09/09/20 0418)     LOS: 17 days    Time spent: 35 mins    Anahis Furgeson, MD [facility]   If 7PM-7AM, please contact night-coverage

## 2020-09-10 NOTE — Progress Notes (Signed)
Physical Therapy Treatment Patient Details Name: [name] MRN: [id number] DOB: 12/24/1950 Today's Date: 09/10/2020    History of Present Illness 70 year old female admitted to [facility] 2/20 in the setting of septic shock from pneumonia requiring intubation, (extubated 2/25). MRI Brain 2/25 Acute/subacute 2 mm right centrum semiovale lacunar insult Transferred to [facility] 2/27 with AMS, respiratory failure due to a left lung mucous plug, Intubated 2/27-3/4. 3/8 MRI brain no changes from 2/25  PMhx: HTN, HLD, pulmonary HTN, CHF, AFib, DM, hypothyroidism, OSA    PT Comments    Patient placed in chair position and able to easily arouse. Patient makes eye contact (turning head and eyes) to both left and right. Performed PROM of UEs and LEs with patient following no commands. She showed spontaneous movement of each UE (reaching toward her mouth) and controlled the descent of her UEs when brought overhead and released. Daughter arrived at end of session and she reports she does not follow commands for her either, but does spontaneously move. Will continue x 1 week at 2x/wk and if continues without progress, will need to discontinue PT services.     Follow Up Recommendations  SNF;Supervision/Assistance - 24 hour (Daughter refusing SNF and reports can provide 24/7 assist)     Equipment Recommendations  Wheelchair (measurements PT);Hospital bed;Other (comment) (hoyer lift)    Recommendations for Other Services       Precautions / Restrictions Precautions Precautions: Fall;Other (comment) Precaution Comments: flexiseal    Mobility  Bed Mobility Overal bed mobility: Needs Assistance             General bed mobility comments: from supine to semi-chair position with HOB 60    Transfers                 General transfer comment: unable to attempt  Ambulation/Gait                 Stairs             Wheelchair Mobility    Modified Rankin (Stroke Patients Only)        Balance                                            Cognition Arousal/Alertness: Lethargic Behavior During Therapy: Flat affect Overall Cognitive Status: Difficult to assess Area of Impairment: Attention;Following commands;Problem solving                   Current Attention Level: Focused         Problem Solving: Slow processing;Decreased initiation;Difficulty sequencing;Requires verbal cues;Requires tactile cues General Comments: Pt not following commands; not vocalizing; pt opens eyes to voice or her name. Tracks with eyes and turns head      Exercises Other Exercises Other Exercises: seated PROM in chair position shoulder flex, hip abdct/adduction, ankle DF; pt would actively reach to her mouth but not on command Other Exercises: cervical rotation    General Comments General comments (skin integrity, edema, etc.): Forgot to try to see pt after 3pm as she normally naps at home from 10am-3pm.      Pertinent Vitals/Pain Pain Assessment: Faces Faces Pain Scale: No hurt    Home Living                      Prior Function  PT Goals (current goals can now be found in the care plan section) Acute Rehab PT Goals Patient Stated Goal: for pt to regain strength (per daughter) Time For Goal Achievement: 09/19/20 Potential to Achieve Goals: Fair Progress towards PT goals: Not progressing toward goals - comment    Frequency    Min 2X/week      PT Plan Frequency needs to be updated    Co-evaluation PT/OT/SLP Co-Evaluation/Treatment: Yes            AM-PAC PT "6 Clicks" Mobility   Outcome Measure  Help needed turning from your back to your side while in a flat bed without using bedrails?: Total Help needed moving from lying on your back to sitting on the side of a flat bed without using bedrails?: Total Help needed moving to and from a bed to a chair (including a wheelchair)?: Total Help needed standing up from a  chair using your arms (e.g., wheelchair or bedside chair)?: Total Help needed to walk in hospital room?: Total Help needed climbing 3-5 steps with a railing? : Total 6 Click Score: 6    End of Session Equipment Utilized During Treatment: Oxygen Activity Tolerance: Patient limited by lethargy Patient left: in bed;with call bell/phone within reach;with family/visitor present;with bed alarm set Nurse Communication: Mobility status;Need for lift equipment PT Visit Diagnosis: Other abnormalities of gait and mobility (R26.89);Muscle weakness (generalized) (M62.81);Difficulty in walking, not elsewhere classified (R26.2)     Time: 1331-1400 PT Time Calculation (min) (ACUTE ONLY): 29 min  Charges:  $Therapeutic Exercise: 23-37 mins                      Jerolyn Center, PT Pager [phone]    Zena Amos 09/10/2020, 2:13 PM

## 2020-09-10 NOTE — Progress Notes (Signed)
Nutrition Follow-up  DOCUMENTATION CODES:   Obesity unspecified  INTERVENTION:   Tube feeding via Cortrak: Osmolite 1.2 at 60 ml/h (1440 ml per day) Prosource TF 45 ml BID  Provides 1728 kcal, 102 gm protein, 1181 ml free water daily   NUTRITION DIAGNOSIS:   Inadequate oral intake related to inability to eat as evidenced by NPO status.  Ongoing.   GOAL:   Patient will meet greater than or equal to 90% of their needs  Met with tube feed.  MONITOR:   PO intake,Vent status,Labs,Weight trends,Skin,TF tolerance  REASON FOR ASSESSMENT:   Consult Enteral/tube feeding initiation and management  ASSESSMENT:   Patient presents with septic shock, pneumonia, acute respiratory failure, acute kidney injury on CKD-3a, DM-2 and obesity. Hx of diastolic heart failure, chronic atrial fibrillation.  Discussed pt in rounds. Pt is tolerating tube feed via Cortrak at goal rate.   Pt's weights reviewed and noted weight fluctuation during admission likely due to fluid accumulation. Admit weight: 229.28# Current weight: 221.34#   Meds reviewed: Lasix (daily), Keppra (BID) Labs reviewed: CBG (168-215)  UOP: 2,000 mL x 24 hrs I&O's reviewed: +1,099.4 since admit  Diet Order:   Diet Order            Diet NPO time specified Except for: Other (See Comments)  Diet effective now                 EDUCATION NEEDS:   Not appropriate for education at this time  Skin:  Skin Assessment: Skin Integrity Issues: Skin Integrity Issues:: DTI DTI: bilateral heels  Last BM:  3/08 (type 7)  Height:   Ht Readings from Last 1 Encounters:  08/30/20 '5\' 6"'  (1.676 m)    Weight:   Wt Readings from Last 1 Encounters:  09/10/20 100.4 kg    Ideal Body Weight:  59 kg  BMI:  Body mass index is 35.73 kg/m.  Estimated Nutritional Needs:   Kcal:  1700-1900  Protein:  100-120 g  Fluid:  >/=1.7 L    Salvadore Oxford, Dietetic Intern 09/10/2020 12:50 PM

## 2020-09-10 NOTE — Plan of Care (Signed)
  Problem: Activity: Goal: Ability to tolerate increased activity will improve Outcome: Progressing   Problem: Clinical Measurements: Goal: Ability to maintain a body temperature in the normal range will improve Outcome: Progressing   Problem: Respiratory: Goal: Ability to maintain adequate ventilation will improve Outcome: Progressing Goal: Ability to maintain a clear airway will improve Outcome: Progressing   Problem: Education: Goal: Knowledge of General Education information will improve Description: Including pain rating scale, medication(s)/side effects and non-pharmacologic comfort measures Outcome: Progressing   Problem: Health Behavior/Discharge Planning: Goal: Ability to manage health-related needs will improve Outcome: Progressing   Problem: Clinical Measurements: Goal: Ability to maintain clinical measurements within normal limits will improve Outcome: Progressing Goal: Will remain free from infection Outcome: Progressing Goal: Diagnostic test results will improve Outcome: Progressing Goal: Respiratory complications will improve Outcome: Progressing Goal: Cardiovascular complication will be avoided Outcome: Progressing   Problem: Activity: Goal: Risk for activity intolerance will decrease Outcome: Progressing   Problem: Nutrition: Goal: Adequate nutrition will be maintained Outcome: Progressing   Problem: Coping: Goal: Level of anxiety will decrease Outcome: Progressing   Problem: Elimination: Goal: Will not experience complications related to bowel motility Outcome: Progressing Goal: Will not experience complications related to urinary retention Outcome: Progressing   Problem: Pain Managment: Goal: General experience of comfort will improve Outcome: Progressing   Problem: Safety: Goal: Ability to remain free from injury will improve Outcome: Progressing   Problem: Skin Integrity: Goal: Risk for impaired skin integrity will decrease Outcome:  Progressing   Problem: Education: Goal: Knowledge of the prescribed therapeutic regimen will improve Outcome: Progressing   Problem: Coping: Goal: Ability to identify and develop effective coping behavior will improve Outcome: Progressing   Problem: Clinical Measurements: Goal: Quality of life will improve Outcome: Progressing   Problem: Respiratory: Goal: Verbalizations of increased ease of respirations will increase Outcome: Progressing   Problem: Role Relationship: Goal: Family's ability to cope with current situation will improve Outcome: Progressing Goal: Ability to verbalize concerns, feelings, and thoughts to partner or family member will improve Outcome: Progressing   Problem: Pain Management: Goal: Satisfaction with pain management regimen will improve Outcome: Progressing   Problem: Education: Goal: Knowledge of disease or condition will improve Outcome: Progressing Goal: Understanding of medication regimen will improve Outcome: Progressing Goal: Individualized Educational Video(s) Outcome: Progressing   Problem: Activity: Goal: Ability to tolerate increased activity will improve Outcome: Progressing   Problem: Cardiac: Goal: Ability to achieve and maintain adequate cardiopulmonary perfusion will improve Outcome: Progressing   Problem: Health Behavior/Discharge Planning: Goal: Ability to safely manage health-related needs after discharge will improve Outcome: Progressing

## 2020-09-10 NOTE — Progress Notes (Signed)
NAME:  [name], MRN:  [id number], DOB:  [birth year], LOS: 4 ADMISSION DATE:  08/23/2020, CONSULTATION DATE:  08/30/2020 REFERRING MD:  Tat, CHIEF COMPLAINT:  Dyspnea   Brief History:  70 year old female initially admitted to [facility] 2/20 in the setting of septic shock from pneumonia requiring intubation, (extubated 2/25). Transferred to [facility] 2/27 in the setting of worsening mental status, worsening respiratory failure due to a left lung mucous plug. She had FOB 2/27 and was extubated 3/4 and improved. Tx to [facility] 3/5 but PCCM re-consulted 3/10 in setting AMS.   Past Medical History:  Anemia Barrett's esophagus Systolic heart failure> LVEF 15 to 20% based on recent echocardiogram from [facility]  Diabetes mellitus type 2 Atrial fibrillation Hyperlipidemia Hypertension Seizure disorder Hypothyroidism  Significant Hospital Events:  2/20 Admitted to [facility], intubated 2/22 Stop amiodarone (esr 95) 2/23 Solumedrol trial @ 80 q 12 and reduced to 40 mg q 12h  Pm 2/25  2/25 Extubated 2/26 Worsening mental status/respiratory failure 2/27 Transferred to [facility], reintubated, bronched 2/28 Decreasing vent settings 3/01 Failed vent wean 2/2 increased HR/RR 3/4 extubated , bipap q hs  Consults:  PCCM  Procedures:  ETT 2/20 >> 2/25, 2/27 >>3/4 R IJ CVL 2/21 >>  Significant Diagnostic Tests:  CT Head 2/21 >> mild atrophy, chronic microvascular disease Head CT 2/27 >>No evidence of new or progressive infarction. CT Chest 2/21 >> b/l ASD, small b/l effusions 2D echo 2/26>>> EF was noted to be 50-55% mild MR and mild decrease RV fxn  EEG 3/7>> This study is suggestive of mild to moderate diffuse encephalopathy, nonspecific etiology. No seizures or epileptiform discharges were seen throughout the recording MRI brain 3/8>> neg acute   Micro Data:  2/20 COVID >> negative 2/20 Flu PCR >> negative 2/20 MRSA PCR >> negative 2/22 Resp Cx >> No wbc, few gpc >> abundant e. faecalis 2/22 BC x 2 (while on  cefepime) >> 1/2 pos staph epi  2/22 Urine strep >> neg 2/22 Urine legionella >> neg 2/27 Resp Cx >> rare candida  Antimicrobials:  Vancomycin 2/20 (single dose), 2/24 (single dose) Cefepime 2/20 >> 2/24 Zosyn 2/24 >> 3/2  Interim History / Subjective:  Progressive AMS, somnolence  Neuro following - MRI neg acute, EEG neg   Per daughter at bedside pt was slightly more alert this am  Objective   Blood pressure (!) 136/95, pulse 86, temperature 97.9 F (36.6 C), temperature source Oral, resp. rate (!) 27, height '5\' 6"'  (1.676 m), weight 100.4 kg, SpO2 94 %.        Intake/Output Summary (Last 24 hours) at 09/10/2020 0932 Last data filed at 09/10/2020 0800 Gross per 24 hour  Intake 2005 ml  Output 2000 ml  Net 5 ml   Filed Weights   09/08/20 0402 09/09/20 0417 09/10/20 0200  Weight: 99.1 kg 98.8 kg 100.4 kg    General:  Elderly, chronically ill-appearing, no distress, somnolent  HEENT: MM pink/moist,  sclera anicteric Neuro: somnolent, minimal response, does not follow commands, protecting airway CV: s1s2 rrr, no m/r/g PULM: resps even non labored, protecting airway, diminished bases  GI: soft, bsx4 active  Extremities: warm/dry, 1+ edema  Skin: no rashes or lesions   Resolved Hospital Problem list    Septic shock  Assessment & Plan:   AMS/delirum - suspect multifactorial in setting ICU delirium (pt has sig hx of same), underlying dementia, recent infection. Of note MRI brain 2/25 showed new acute/subacute 58m R lacunar infarct  PLAN -  Neuro  following  ABG now  Hold sedating medications  PT/OT Continue aricept, cymbalta   Acute respiratory failure with hypoxemia due to left mainstem mucous plug Recent healthcare associated pneumonia - Finished course of Zosyn 3/2 S/p bronchoscopy 2/27 with total occlusion of L mainstem with mucous plug, sent for Cx. P: --BiPAP qhs as mental status tolerates  - Pulmonary toilet (hypertonic saline, guaifenesin , chest PT via bed  ) - f/u CXR, ABG now as above   Atrial fibrillation -continue amiodarone , no evidence of fibrosis on CT scan -continue eliquis  -Low-dose metoprolol  Anemia, likely of critical illness Baseline on admission 10 P: -Hgb drift since admission without evidence of acute bleeding -trend h/h, transfuse for Hgb <7  Chronic systolic heart failure Echo 2/26 with EF of 50-55% P: -continue Lasix 73m daily, metoprolol, PRN hydralazine  -monitor UOP and weight   Diabetes mellitus type 2 Hyperglycemia likely 2/2 enteral nutrition - Monitor CBGs - Lantus up to 70 units, will decrease to 25 units every 12 now that oral intake is decreased - SSI resistant scale   Hypothyroidism - Continue Synthroid  History of seizures - Continue Keppra, changed to oral  Dementia: - Continue Aricept  DC central line if peripheral IV obtained, daughter updated at bedside  Best practice (evaluated daily)  Diet: TFs Pain/Anxiety/Delirium protocol (if indicated): As above VAP protocol (if indicated): In place DVT prophylaxis: apixaban  GI prophylaxis: PPI Glucose control: SSI Mobility: PT Disposition: ICU  Goals of Care:  Last date of multidisciplinary goals of care discussion: 2/27 Family and staff present: Dr. TCarles Collet daughter Summary of discussion: full scope of practice Follow up goals of care discussion : 3/4, daughter & RN LLattie Haw, full scope of care including reintubate and trach if needed, -[facility] Code Status: Full    KNickolas Madrid NP Pulmonary/Critical Care Medicine  09/10/2020  9:32 AM

## 2020-09-11 ENCOUNTER — Inpatient Hospital Stay (HOSPITAL_COMMUNITY): Payer: PPO

## 2020-09-11 DIAGNOSIS — E7220 Disorder of urea cycle metabolism, unspecified: Secondary | ICD-10-CM

## 2020-09-11 DIAGNOSIS — G9341 Metabolic encephalopathy: Secondary | ICD-10-CM | POA: Diagnosis not present

## 2020-09-11 DIAGNOSIS — J9601 Acute respiratory failure with hypoxia: Secondary | ICD-10-CM | POA: Diagnosis not present

## 2020-09-11 DIAGNOSIS — J181 Lobar pneumonia, unspecified organism: Secondary | ICD-10-CM | POA: Diagnosis not present

## 2020-09-11 LAB — GLUCOSE, CAPILLARY
Glucose-Capillary: 154 mg/dL — ABNORMAL HIGH (ref 70–99)
Glucose-Capillary: 205 mg/dL — ABNORMAL HIGH (ref 70–99)
Glucose-Capillary: 175 mg/dL — ABNORMAL HIGH (ref 70–99)
Glucose-Capillary: 161 mg/dL — ABNORMAL HIGH (ref 70–99)
Glucose-Capillary: 157 mg/dL — ABNORMAL HIGH (ref 70–99)
Glucose-Capillary: 188 mg/dL — ABNORMAL HIGH (ref 70–99)

## 2020-09-11 LAB — BASIC METABOLIC PANEL
Anion gap: 7 (ref 5–15)
BUN: 26 mg/dL — ABNORMAL HIGH (ref 8–23)
CO2: 27 mmol/L (ref 22–32)
Calcium: 9.3 mg/dL (ref 8.9–10.3)
Chloride: 105 mmol/L (ref 98–111)
Creatinine, Ser: 0.88 mg/dL (ref 0.44–1.00)
GFR, Estimated: >60 mL/min (ref >60)
Glucose, Bld: 194 mg/dL — ABNORMAL HIGH (ref 70–99)
Potassium: 4 mmol/L (ref 3.5–5.1)
Sodium: 139 mmol/L (ref 135–145)

## 2020-09-11 LAB — CBC
HCT: 28.8 % — ABNORMAL LOW (ref 36.0–46.0)
Hemoglobin: 9.1 g/dL — ABNORMAL LOW (ref 12.0–15.0)
MCH: 33.6 pg (ref 26.0–34.0)
MCHC: 31.6 g/dL (ref 30.0–36.0)
MCV: 106.3 fL — ABNORMAL HIGH (ref 80.0–100.0)
Platelets: 206 10*3/uL (ref 150–400)
RBC: 2.71 MIL/uL — ABNORMAL LOW (ref 3.87–5.11)
RDW: 17.7 % — ABNORMAL HIGH (ref 11.5–15.5)
WBC: 6.9 10*3/uL (ref 4.0–10.5)
nRBC: 0.3 % — ABNORMAL HIGH (ref 0.0–0.2)

## 2020-09-11 MED ORDER — INSULIN GLARGINE 100 UNIT/ML ~~LOC~~ SOLN
40.0000 [IU] | Freq: Two times a day (BID) | SUBCUTANEOUS | Status: DC
  Administered 2020-09-11 – 2020-09-18 (×15): 40 [IU] via SUBCUTANEOUS
  Filled 2020-09-11 (×17): qty 0.4

## 2020-09-11 MED ORDER — SODIUM CHLORIDE 3 % IN NEBU
4.0000 mL | INHALATION_SOLUTION | RESPIRATORY_TRACT | Status: AC
  Administered 2020-09-11 – 2020-09-13 (×9): 4 mL via RESPIRATORY_TRACT
  Filled 2020-09-11 (×15): qty 4

## 2020-09-11 MED ORDER — LACTULOSE 10 GM/15ML PO SOLN
10.0000 g | Freq: Two times a day (BID) | ORAL | Status: DC
  Administered 2020-09-11 – 2020-09-19 (×16): 10 g
  Filled 2020-09-11 (×16): qty 15

## 2020-09-11 NOTE — Plan of Care (Signed)
  Problem: Clinical Measurements: Goal: Ability to maintain a body temperature in the normal range will improve Outcome: Progressing   Problem: Respiratory: Goal: Ability to maintain adequate ventilation will improve Outcome: Progressing Goal: Ability to maintain a clear airway will improve Outcome: Progressing   Problem: Education: Goal: Knowledge of General Education information will improve Description: Including pain rating scale, medication(s)/side effects and non-pharmacologic comfort measures Outcome: Progressing   Problem: Health Behavior/Discharge Planning: Goal: Ability to manage health-related needs will improve Outcome: Progressing   Problem: Clinical Measurements: Goal: Ability to maintain clinical measurements within normal limits will improve Outcome: Progressing Goal: Will remain free from infection Outcome: Progressing Goal: Diagnostic test results will improve Outcome: Progressing Goal: Respiratory complications will improve Outcome: Progressing Goal: Cardiovascular complication will be avoided Outcome: Progressing   Problem: Activity: Goal: Risk for activity intolerance will decrease Outcome: Progressing   Problem: Nutrition: Goal: Adequate nutrition will be maintained Outcome: Progressing   Problem: Coping: Goal: Level of anxiety will decrease Outcome: Progressing   Problem: Elimination: Goal: Will not experience complications related to bowel motility Outcome: Progressing Goal: Will not experience complications related to urinary retention Outcome: Progressing   Problem: Pain Managment: Goal: General experience of comfort will improve Outcome: Progressing   Problem: Safety: Goal: Ability to remain free from injury will improve Outcome: Progressing   Problem: Skin Integrity: Goal: Risk for impaired skin integrity will decrease Outcome: Progressing   Problem: Education: Goal: Knowledge of the prescribed therapeutic regimen will  improve Outcome: Progressing   Problem: Coping: Goal: Ability to identify and develop effective coping behavior will improve Outcome: Progressing   Problem: Clinical Measurements: Goal: Quality of life will improve Outcome: Progressing   Problem: Respiratory: Goal: Verbalizations of increased ease of respirations will increase Outcome: Progressing   Problem: Role Relationship: Goal: Family's ability to cope with current situation will improve Outcome: Progressing Goal: Ability to verbalize concerns, feelings, and thoughts to partner or family member will improve Outcome: Progressing   Problem: Pain Management: Goal: Satisfaction with pain management regimen will improve Outcome: Progressing   Problem: Education: Goal: Knowledge of disease or condition will improve Outcome: Progressing Goal: Understanding of medication regimen will improve Outcome: Progressing Goal: Individualized Educational Video(s) Outcome: Progressing   Problem: Activity: Goal: Ability to tolerate increased activity will improve Outcome: Progressing   Problem: Cardiac: Goal: Ability to achieve and maintain adequate cardiopulmonary perfusion will improve Outcome: Progressing   Problem: Health Behavior/Discharge Planning: Goal: Ability to safely manage health-related needs after discharge will improve Outcome: Progressing

## 2020-09-11 NOTE — Progress Notes (Signed)
  Speech Language Pathology Treatment: Dysphagia  Patient Details Name: [name] MRN: [id number] DOB: 21-Nov-1950 Today's Date: 09/11/2020 Time: 4098-1191 SLP Time Calculation (min) (ACUTE ONLY): 16.2 min  Assessment / Plan / Recommendation Clinical Impression  Pt was seen for dysphagia treatment with her daughter present. Pt was repositioned and roused with verbal and tactile stimulation, but she was lethargic and often closed her eyes if stimulation was removed for more than two minutes. Oral care was provided and pt demonstrated lingual manipulation of oral suction. Trials were limited to ice chips and puree solids due to her lethargy. Bolus manipulation was prolonged and intermittent cueing was needed for pt to open her mouth and accept boluses. However, pt independently swallowed and no s/sx of aspiration were noted. [name] was educated regarding the pt's performance and the risk of aspiration due to the pt's lethargy. It is recommended that the pt's NPO status be maintained, but she may continue to have ice chips following oral care is she is adequately alert. SLP will continue to follow pt.   HPI HPI: Pt is a 70 y/o female with systolic and diastolic heart failure, diabetes, A. fib, hypertension, dementia, seizure disorder.  She was admitted to [facility] 2/20 with E faecalis left lower lobe pneumonia and septic shock requiring intubation 2/20-2/25 but decompensated and required reintubation 2/27-3/4. She had left lower lobe and partial left upper lobe collapse due to mucous plugging, improved after bronchoscopy 2/27. MRI and EEG were negative. Multidisciplinary goals of care discussion 2/27 and follow up on 3/4: full scope of care including reintubate and trach if needed. CXR 3/11: Suspect underlying congestive heart failure, stable. Areas of  ill-defined airspace opacity in the right upper lobe and left lower  lung regions raise concern for multifocal pneumonia superimposed on  apparent  congestive heart failure.      SLP Plan  Continue with current plan of care       Recommendations  Diet recommendations: NPO (allow ice chips if pt is fully alert) Medication Administration: Via alternative means                Oral Care Recommendations: Oral care QID Follow up Recommendations:  (TBD) SLP Visit Diagnosis: Dysphagia, unspecified (R13.10) Plan: Continue with current plan of care       Norm Wray I. Vear Clock, MS, CCC-SLP Acute Rehabilitation Services Office number [phone] Pager [phone]                [name] 09/11/2020, 10:30 AM

## 2020-09-11 NOTE — Progress Notes (Signed)
PROGRESS NOTE    ECE CUMBERLAND  YHC:[id number] DOB: June 13, 1951 DOA: 08/23/2020 PCP: Lemmie Evens, MD    Brief Narrative: This 43 y/ofemale admitted to [facility] on 08/23/20 in the setting of septic shock from pneumonia requiring intubation, She had atrial fibrillation at baseline and had been on amiodarone, this was held for concerns of possible amiodarone induced lung toxicity and she was treated with steroids. She was extubated on 08/28/20. However, her mental status declined overnight from 2/26 through early 2/27. A blood gas was obtained which did not show hypercarbia. However after the blood gas was performed patient has worsening of hypoxia and mental status. In consultation with the pulmonary critical care service in [place] it was decided that she should be electively intubated and then transferred to [facility] for  bronchoscopy.  She is s/p bronchoscopy for mucus plugging, she finished abx treatment, then extubated on 3/4, transferred to hospitalist service on 3/6.  She still remains to be drowsy, does not follow commands, opens eyes on calling her name. Neurology consulted. MRI unremarkable.  Patient is having delirium.  Significant Hospital Events:  2/20 Admitted to [facility], intubated 2/22 Stop amiodarone (esr 95) 2/23 Solumedrol trial @ 80 q 12 and reduced to 40 mg q 12h Pm 2/25  2/25 Extubated 2/26 Worsening mental status/respiratory failure 2/27 Transferred to [facility], reintubated, bronched 2/28 Decreasing vent settings 3/01 Failed vent wean 2/2 increased HR/RR 3/4   Extubated , bipap q hs  Assessment & Plan:   Principal Problem:   Lobar pneumonia ([place]) Active Problems:   Hypothyroidism   Hyperlipidemia   Essential hypertension   Atrial fibrillation (HCC)   Congestive heart failure (HCC)   Gastroparesis   Seizure ([place])   Dementia ([place])   DM type 2 (diabetes mellitus, type 2) ([place])   Acute respiratory failure (HCC)   Respiratory failure with hypoxia ([place])    Fibromyalgia   Septic shock ([place])   Acute ischemic stroke ([place])   Acute metabolic encephalopathy  Acute metabolic encephalopathy :  Fluctuating -Daughter reports at baseline, She is alert and independent. -She has been very drowsy , has waxing and waning in her mental status. -ABG showed no PCo2 retention, CT head no acute finding, EEG : No evidence of seizures.  -There is  mild hypernatremia, Delirium could be possibility.  UA unremarkable. -Cortack inserted for nutrition. changed meds to IV or subQ, changed back to po through tube. -Monitor sodium level. 150 >149 > 145 >139 -Neuro checks every 4 hours. -Neurology consulted for further evaluation.  MRI unremarkable. -Neurology recommended to continue supportive care.  Patient is having delirium. -Pulmonology suspect ICU delirium superimposed on underlying dementia. -Continue to hold sedating medications. -Repeat ABG does not show PCO2 retention. -Ammonia slightly elevated. Continue Lactulose  Acute hypoxic respiratory failure due to left mainstem mucous pluging Recent healthcare associated pneumonia:  She has recently finished course of antibiotics Zosyn on 3/2. S/p bronchoscopy on 2/27 with total occlusion of L mainstem with mucous plug, sent for Cx. Continue BiPAP nightly and as needed. Continue Pulmonary toilet (hypertonic saline, guaifenesin,chest PT via bed ). Chest x-ray no infiltrate but consistent with pulmonary interstitial edema. Patient is at very high risk for reintubation.  Chronic A. Fib : Her heart rate remains controlled, amiodarone been resumed, no evidence of fibrosis on CT scan. She was on heparin drip then apixaban, Lovenox switched to Eliquis through tube.  Chronic diastolic CHF  Echo 5/17 with EF of 50-55%. Currently does not appear volume overloaded with  poor oral intake. Continue Lasix 40 mg daily.  Hypokalemia : -Replace K as needed.  Hypernatremia > Improved.  Na 150> 149> 145  >?139 Continue free water flush through core track tube if not able to take PO.  Insulin-dependent type 2 diabetes Uncontrolled, A1c 7.9 Continue insulin SS, adjust as needed.  CKD 2/anemia of chronic disease Renal function and hemoglobin appear close to baseline. Monitor  Renal functions  History of seizures Continue Keppra 1000 mg twice daily through tube. EEG completed, no evidence of seizures.  Hypothyroidism Continue Synthroid   Dementia Continue Aricept. Daughter wants Cymbalta to be started.   Obesity: Body mass index is 36.08 kg/m. outpatient PCP follow up.   DVT prophylaxis: Eliquis Code Status: Full code Family Communication: Spoke with Daughter at bed side. Disposition Plan:   Status is: Inpatient  Remains inpatient appropriate because:Inpatient level of care appropriate due to severity of illness  Dispo: The patient is from: Home              Anticipated d/c is to: TBD              Patient currently is not medically stable to d/c.   Difficult to place patient No  Consultants:   Pulm/ Critical care.  Procedures:  ETT 2/20 >>2/25, 2/27 >>3/4, tube feeding while on vent R IJ CVL 2/21 >>   Antimicrobials:  Anti-infectives (From admission, onward)   Start     Dose/Rate Route Frequency Ordered Stop   08/28/20 1900  vancomycin (VANCOREADY) IVPB 1250 mg/250 mL  Status:  Discontinued        1,250 mg 166.7 mL/hr over 90 Minutes Intravenous Every 24 hours 08/27/20 1820 08/28/20 1204   08/27/20 2200  piperacillin-tazobactam (ZOSYN) IVPB 3.375 g        3.375 g 12.5 mL/hr over 240 Minutes Intravenous Every 8 hours 08/27/20 1412 09/03/20 0141   08/27/20 1915  vancomycin (VANCOREADY) IVPB 2000 mg/400 mL        2,000 mg 200 mL/hr over 120 Minutes Intravenous  Once 08/27/20 1816 08/27/20 2156   08/27/20 1430  piperacillin-tazobactam (ZOSYN) IVPB 3.375 g        3.375 g 100 mL/hr over 30 Minutes Intravenous  Once 08/27/20 1412 08/27/20 1502    08/27/20 1000  ceFEPIme (MAXIPIME) 2 g in sodium chloride 0.9 % 100 mL IVPB  Status:  Discontinued        2 g 200 mL/hr over 30 Minutes Intravenous Every 12 hours 08/27/20 0744 08/27/20 1412   08/26/20 1000  ceFEPIme (MAXIPIME) 2 g in sodium chloride 0.9 % 100 mL IVPB  Status:  Discontinued        2 g 200 mL/hr over 30 Minutes Intravenous Every 8 hours 08/26/20 0848 08/27/20 0744   08/25/20 1200  vancomycin (VANCOCIN) IVPB 1000 mg/200 mL premix  Status:  Discontinued        1,000 mg 200 mL/hr over 60 Minutes Intravenous Every 24 hours 08/24/20 1043 08/24/20 1452   08/24/20 1200  vancomycin (VANCOREADY) IVPB 2000 mg/400 mL  Status:  Discontinued        2,000 mg 200 mL/hr over 120 Minutes Intravenous  Once 08/24/20 1009 08/24/20 1452   08/24/20 1045  ceFEPIme (MAXIPIME) 2 g in sodium chloride 0.9 % 100 mL IVPB  Status:  Discontinued        2 g 200 mL/hr over 30 Minutes Intravenous Every 12 hours 08/24/20 0958 08/26/20 0848   08/24/20 0100  ceFEPIme (MAXIPIME) 2 g in  sodium chloride 0.9 % 100 mL IVPB        2 g 200 mL/hr over 30 Minutes Intravenous  Once 08/24/20 0047 08/24/20 2244       Subjective: Patient was seen and examined at bedside.  Overnight events noted.   Patient continues to remain lethargic, not following commands, opens eyes on calling her name.   Daughter is at bedside, All questions answered.  Pulmonology suspect ICU delirium over underlying dementia.  Objective: Vitals:   09/11/20 0800 09/11/20 0900 09/11/20 1046 09/11/20 1118  BP: (!) 142/87 (!) 159/87 131/84   Pulse: (!) 101  92   Resp: (!) 23 (!) 31    Temp:    98.4 F (36.9 C)  TempSrc:    Oral  SpO2: 97%     Weight:      Height:        Intake/Output Summary (Last 24 hours) at 09/11/2020 1137 Last data filed at 09/11/2020 0900 Gross per 24 hour  Intake 1200 ml  Output 800 ml  Net 400 ml   Filed Weights   09/09/20 0417 09/10/20 0200 09/11/20 0500  Weight: 98.8 kg 100.4 kg 100.4 kg     Examination:  General exam: Appears calm and comfortable, Lethargic , not following commands. Respiratory system: Crackles to auscultation. Respiratory effort normal. Cardiovascular system: S1 & S2 heard, RRR. No JVD, murmurs, rubs, gallops or clicks. No pedal edema. Gastrointestinal system: Abdomen is nondistended, soft and nontender. No organomegaly or masses felt.  Normal bowel sounds heard. Central nervous system: Drowsy,  Not assessed. Extremities:  No edema, no cyanosis, no clubbing Skin: No rashes, lesions or ulcers Psychiatry: Judgement and insight appear normal. Mood & affect appropriate.     Data Reviewed: I have personally reviewed following labs and imaging studies  CBC: Recent Labs  Lab 09/07/20 0114 09/08/20 0440 09/09/20 0117 09/10/20 0139 09/11/20 0203  WBC 8.4 7.4 6.0 7.2 6.9  HGB 8.6* 8.8* 8.5* 8.9* 9.1*  HCT 29.4* 28.4* 27.6* 30.4* 28.8*  MCV 109.7* 107.2* 108.2* 110.1* 106.3*  PLT 176 195 198 202 975   Basic Metabolic Panel: Recent Labs  Lab 09/05/20 0418 09/06/20 0342 09/07/20 0114 09/08/20 0440 09/09/20 0117 09/10/20 0139 09/11/20 0203  NA 149*   < > 150* 149* 145 142 139  K 3.5   < > 4.0 3.0* 3.9 3.8 4.0  CL 115*   < > 116* 116* 115* 109 105  CO2 24   < > 23 23 21* 26 27  GLUCOSE 168*   < > 144* 266* 282* 198* 194*  BUN 23   < > 28* 27* 28* 26* 26*  CREATININE 0.84   < > 1.17* 1.10* 1.07* 0.89 0.88  CALCIUM 9.4   < > 9.1 9.4 9.0 9.1 9.3  MG 2.0  --  2.1 2.1  --  2.1  --   PHOS 3.5  --   --  3.5  --  4.3  --    < > = values in this interval not displayed.   GFR: Estimated Creatinine Clearance: 70.1 mL/min (by C-G formula based on SCr of 0.88 mg/dL). Liver Function Tests: Recent Labs  Lab 09/08/20 0440  AST 17  ALT 19  ALKPHOS 50  BILITOT 1.0  PROT 5.8*  ALBUMIN 2.7*   No results for input(s): LIPASE, AMYLASE in the last 168 hours. Recent Labs  Lab 09/10/20 0934  AMMONIA 38*   Coagulation Profile: No results for  input(s): INR, PROTIME in the last  168 hours. Cardiac Enzymes: No results for input(s): CKTOTAL, CKMB, CKMBINDEX, TROPONINI in the last 168 hours. BNP (last 3 results) No results for input(s): PROBNP in the last 8760 hours. HbA1C: No results for input(s): HGBA1C in the last 72 hours. CBG: Recent Labs  Lab 09/10/20 1929 09/10/20 2311 09/11/20 0321 09/11/20 0741 09/11/20 1113  GLUCAP 196* 198* 154* 205* 175*   Lipid Profile: No results for input(s): CHOL, HDL, LDLCALC, TRIG, CHOLHDL, LDLDIRECT in the last 72 hours. Thyroid Function Tests: No results for input(s): TSH, T4TOTAL, FREET4, T3FREE, THYROIDAB in the last 72 hours. Anemia Panel: No results for input(s): VITAMINB12, FOLATE, FERRITIN, TIBC, IRON, RETICCTPCT in the last 72 hours. Sepsis Labs: No results for input(s): PROCALCITON, LATICACIDVEN in the last 168 hours.  No results found for this or any previous visit (from the past 240 hour(s)).  Radiology Studies: DG Chest Port 1 View  Result Date: 09/11/2020 CLINICAL DATA:  Respiratory failure.  Atelectasis. EXAM: PORTABLE CHEST 1 VIEW COMPARISON:  September 10, 2020 FINDINGS: Enteric tube tip is below the diaphragm. No pneumothorax. Airspace opacity persists in the right upper lobe and left lower lung regions. There is mild right base atelectasis. Small left pleural effusion. There is interstitial edema. There is persistent cardiomegaly with pulmonary venous hypertension. No adenopathy. There is aortic atherosclerosis. There is mitral annulus calcification. No bone lesions. IMPRESSION: Suspect underlying congestive heart failure, stable. Areas of ill-defined airspace opacity in the right upper lobe and left lower lung regions raise concern for multifocal pneumonia superimposed on apparent congestive heart failure. Note that alveolar edema could present in this manner with potential pneumonia alveolar edema presenting currently. Appearance essentially unchanged from 1 day prior. Aortic  Atherosclerosis (ICD10-I70.0). Electronically Signed   By: Lowella Grip III M.D.   On: 09/11/2020 07:49   DG Chest Port 1 View  Result Date: 09/10/2020 CLINICAL DATA:  Respiratory failure EXAM: PORTABLE CHEST 1 VIEW COMPARISON:  September 08, 2020 FINDINGS: Enteric tube tip is below the diaphragm. No pneumothorax. There is airspace opacity in the right upper lobe and left lower lobe regions, most likely representing multifocal pneumonia. Small left pleural effusion. There is a degree of interstitial edema as well. There is cardiomegaly with pulmonary venous hypertension. No adenopathy. There is aortic atherosclerosis. There is mitral annulus calcification. No bone lesions. IMPRESSION: Enteric tube as described. There is airspace opacity in the right upper lobe and left base regions which are suspicious for multifocal pneumonia. Pulmonary edema could present in this manner and is a differential consideration for these airspace opacities. Elsewhere, there is cardiomegaly with pulmonary venous hypertension and mild interstitial edema. Small left pleural effusion. Suspect a degree of underlying congestive heart failure. Aortic Atherosclerosis (ICD10-I70.0). Electronically Signed   By: Lowella Grip III M.D.   On: 09/10/2020 10:23    Scheduled Meds: . acetylcysteine  4 mL Nebulization TID  . amiodarone  200 mg Per Tube Daily  . apixaban  5 mg Per Tube BID  . aspirin  81 mg Per Tube Daily  . atorvastatin  80 mg Per Tube Daily  . chlorhexidine  15 mL Mouth Rinse BID  . Chlorhexidine Gluconate Cloth  6 each Topical Daily  . donepezil  5 mg Per Tube QHS  . DULoxetine  30 mg Oral QHS  . feeding supplement (PROSource TF)  45 mL Per Tube BID  . furosemide  40 mg Oral Daily  . guaiFENesin  5 mL Per Tube Q6H  . insulin aspart  0-20 Units Subcutaneous  Q4H  . insulin aspart  6 Units Subcutaneous Q4H  . insulin glargine  40 Units Subcutaneous BID  . lactulose  10 g Per Tube BID  . levETIRAcetam  1,000 mg  Per Tube BID  . levothyroxine  175 mcg Per Tube Q0600  . mouth rinse  15 mL Mouth Rinse q12n4p  . metoprolol tartrate  12.5 mg Per Tube BID  . sodium chloride HYPERTONIC  4 mL Nebulization Q4H while awake   Continuous Infusions: . sodium chloride Stopped (09/06/20 1228)  . feeding supplement (OSMOLITE 1.2 CAL) 1,000 mL (09/10/20 2209)     LOS: 18 days    Time spent: 35 mins    Ellanora Rayborn, MD [facility]   If 7PM-7AM, please contact night-coverage

## 2020-09-11 NOTE — Progress Notes (Signed)
NAME:  [name], MRN:  [id number], DOB:  [birth year], LOS: 33 ADMISSION DATE:  08/23/2020, CONSULTATION DATE:  08/30/2020 REFERRING MD:  Tat, CHIEF COMPLAINT:  Dyspnea   Brief History:  70 year old female initially admitted to [facility] 2/20 in the setting of septic shock from pneumonia requiring intubation, (extubated 2/25). Transferred to [facility] 2/27 in the setting of worsening mental status, worsening respiratory failure due to a left lung mucous plug. She had FOB 2/27 and was extubated 3/4 and improved. Tx to [facility] 3/5 but PCCM re-consulted 3/10 in setting AMS.   Past Medical History:  Anemia Barrett's esophagus Systolic heart failure> LVEF 15 to 20% based on recent echocardiogram from [facility]  Diabetes mellitus type 2 Atrial fibrillation Hyperlipidemia Hypertension Seizure disorder Hypothyroidism  Significant Hospital Events:  2/20 Admitted to [facility], intubated 2/22 Stop amiodarone (esr 95) 2/23 Solumedrol trial @ 80 q 12 and reduced to 40 mg q 12h  Pm 2/25  2/25 Extubated 2/26 Worsening mental status/respiratory failure 2/27 Transferred to [facility], reintubated, bronched 2/28 Decreasing vent settings 3/01 Failed vent wean 2/2 increased HR/RR 3/4 extubated , bipap q hs  Consults:  PCCM  Procedures:  ETT 2/20 >> 2/25, 2/27 >>3/4 R IJ CVL 2/21 >>  Significant Diagnostic Tests:  CT Head 2/21 >> mild atrophy, chronic microvascular disease Head CT 2/27 >>No evidence of new or progressive infarction. CT Chest 2/21 >> b/l ASD, small b/l effusions 2D echo 2/26>>> EF was noted to be 50-55% mild MR and mild decrease RV fxn  EEG 3/7>> This study is suggestive of mild to moderate diffuse encephalopathy, nonspecific etiology. No seizures or epileptiform discharges were seen throughout the recording MRI brain 3/8>> neg acute   Micro Data:  2/20 COVID >> negative 2/20 Flu PCR >> negative 2/20 MRSA PCR >> negative 2/22 Resp Cx >> No wbc, few gpc >> abundant e. faecalis 2/22 BC x 2 (while on  cefepime) >> 1/2 pos staph epi  2/22 Urine strep >> neg 2/22 Urine legionella >> neg 2/27 Resp Cx >> rare candida  Antimicrobials:  Vancomycin 2/20 (single dose), 2/24 (single dose) Cefepime 2/20 >> 2/24 Zosyn 2/24 >> 3/2  Interim History / Subjective:  No significant improvement in  AMS, somnolence  Neuro following - MRI neg acute, EEG neg   Per daughter at bedside pt was slightly more alert this am Per nursing patient did say good morning, she does not follow commands  WBC is 6.9, HGB 9.1, platelets are 206, T Max 98.5 Net + 4 L, 800 UO last 24 hours Ammonia level 38 on 3/10 She was not placed on BiPAP last pm Weak cough per nursing No significant in secretion mobilization   Objective   Blood pressure (!) 159/87, pulse (!) 101, temperature 98.2 F (36.8 C), temperature source Oral, resp. rate (!) 31, height 5' 6" (1.676 m), weight 100.4 kg, SpO2 97 %.        Intake/Output Summary (Last 24 hours) at 09/11/2020 0910 Last data filed at 09/11/2020 0700 Gross per 24 hour  Intake 1410 ml  Output 800 ml  Net 610 ml   Filed Weights   09/09/20 0417 09/10/20 0200 09/11/20 0500  Weight: 98.8 kg 100.4 kg 100.4 kg    General:  Elderly, chronically ill-appearing, no distress, somnolent  HEENT: MM pink/moist,  sclera anicteric, No LAD, No JVD Neuro: somnolent, minimal response, does not follow commands, protecting airway, weak cough CV: s1s2 rrr, no m/r/g PULM: resps even non labored, protecting airway, diminished bases, but  RR is 28-30  GI: soft, bsx4 active , Body mass index is 35.73 kg/m. Extremities: warm/dry, 1+ edema , no obvious deformities Skin: no rashes or lesions, some bruising    Resolved Hospital Problem list    Septic shock  Assessment & Plan:   AMS/delirum - suspect multifactorial in setting ICU delirium (pt has sig hx of same), underlying dementia, recent infection. Of note MRI brain 2/25 showed new acute/subacute 47m R lacunar infarct  PLAN -  Neuro  following  ABG prn ( Compensated 3/10) Hold sedating medications  PT/OT Continue aricept, cymbalta   Acute respiratory failure with hypoxemia due to left mainstem mucous plug Recent healthcare associated pneumonia - Finished course of Zosyn 3/2 S/p bronchoscopy 2/27 with total occlusion of L mainstem with mucous plug, sent for Cx. P: - Aggressive Pulmonary toilet (hypertonic saline, guaifenesin , chest PT via bed ) - f/u CXR, ABG prn - NTS Q 4 from 8 am -10 pm>> gastric bolus auscultation of Feeding tube after NTS  Atrial fibrillation -continue amiodarone , no evidence of fibrosis on CT scan -continue eliquis  -Low-dose metoprolol  Anemia, likely of critical illness Baseline on admission 10 P: -Hgb drift since admission without evidence of acute bleeding -trend h/h, transfuse for Hgb <7  Chronic systolic heart failure Echo 2/26 with EF of 50-55% P: -continue Lasix 468mdaily, metoprolol, PRN hydralazine  -monitor UOP and weight   Diabetes mellitus type 2 Hyperglycemia likely 2/2 enteral nutrition - Monitor CBGs - Will increase Lantus to 40 BID as CBG's remain > 150 - SSI resistant scale   Hypothyroidism - Continue Synthroid  History of seizures - Continue Keppra, changed to oral - monitor for seizure activity  Dementia: - Continue Aricept  Central line has been d/c'd. Patient has 1 peripheral IV  Ongoing goals of care conversations , as this is a cyclical issue of inability to clear her airway. Dr. ClCarlis Abbottpoke with Daughter again 3/11 regarding goals of care.Daughter wants intubation if needed at this point.   OK from PCCM perspective to transfer to progressive bed after 3/12 and 24 additional hours of NTS for mucus clearance   Best practice (evaluated daily)  Diet: TFs Pain/Anxiety/Delirium protocol (if indicated): As above VAP protocol (if indicated): In place DVT prophylaxis: apixaban  GI prophylaxis: PPI Glucose control: SSI, TF coverage and  Lantus Mobility: PT Disposition: ICU, Tx to progressive 3/ 12 after 24 hours of NTS  Goals of Care:  Last date of multidisciplinary goals of care discussion: 2/27 Family and staff present: Dr. TaCarles Colletdaughter Summary of discussion: full scope of practice Follow up goals of care discussion : 3/4, daughter & RN LiLattie Haw full scope of care including reintubate and trach if needed, -Pt has already been through trach/LTACH, family turndown palliative care consultation Code Status: Full    SaMagdalen SpatzMSN, AGACNP-BC [place] personal pager PCCM on call pager ([phone]/05/2021  9:10 AM

## 2020-09-12 DIAGNOSIS — R1310 Dysphagia, unspecified: Secondary | ICD-10-CM

## 2020-09-12 DIAGNOSIS — J9601 Acute respiratory failure with hypoxia: Secondary | ICD-10-CM | POA: Diagnosis not present

## 2020-09-12 DIAGNOSIS — J690 Pneumonitis due to inhalation of food and vomit: Secondary | ICD-10-CM

## 2020-09-12 DIAGNOSIS — J181 Lobar pneumonia, unspecified organism: Secondary | ICD-10-CM | POA: Diagnosis not present

## 2020-09-12 LAB — CBC
HCT: 29.1 % — ABNORMAL LOW (ref 36.0–46.0)
Hemoglobin: 8.7 g/dL — ABNORMAL LOW (ref 12.0–15.0)
MCH: 32.7 pg (ref 26.0–34.0)
MCHC: 29.9 g/dL — ABNORMAL LOW (ref 30.0–36.0)
MCV: 109.4 fL — ABNORMAL HIGH (ref 80.0–100.0)
Platelets: 191 10*3/uL (ref 150–400)
RBC: 2.66 MIL/uL — ABNORMAL LOW (ref 3.87–5.11)
RDW: 18 % — ABNORMAL HIGH (ref 11.5–15.5)
WBC: 7.6 10*3/uL (ref 4.0–10.5)
nRBC: 0.4 % — ABNORMAL HIGH (ref 0.0–0.2)

## 2020-09-12 LAB — COMPREHENSIVE METABOLIC PANEL
ALT: 21 U/L (ref 0–44)
AST: 21 U/L (ref 15–41)
Albumin: 2.7 g/dL — ABNORMAL LOW (ref 3.5–5.0)
Alkaline Phosphatase: 42 U/L (ref 38–126)
Anion gap: 6 (ref 5–15)
BUN: 26 mg/dL — ABNORMAL HIGH (ref 8–23)
CO2: 30 mmol/L (ref 22–32)
Calcium: 9.1 mg/dL (ref 8.9–10.3)
Chloride: 104 mmol/L (ref 98–111)
Creatinine, Ser: 0.91 mg/dL (ref 0.44–1.00)
GFR, Estimated: >60 mL/min (ref >60)
Glucose, Bld: 226 mg/dL — ABNORMAL HIGH (ref 70–99)
Potassium: 4.3 mmol/L (ref 3.5–5.1)
Sodium: 140 mmol/L (ref 135–145)
Total Bilirubin: 0.6 mg/dL (ref 0.3–1.2)
Total Protein: 5.5 g/dL — ABNORMAL LOW (ref 6.5–8.1)

## 2020-09-12 LAB — GLUCOSE, CAPILLARY
Glucose-Capillary: 206 mg/dL — ABNORMAL HIGH (ref 70–99)
Glucose-Capillary: 115 mg/dL — ABNORMAL HIGH (ref 70–99)
Glucose-Capillary: 166 mg/dL — ABNORMAL HIGH (ref 70–99)
Glucose-Capillary: 165 mg/dL — ABNORMAL HIGH (ref 70–99)
Glucose-Capillary: 160 mg/dL — ABNORMAL HIGH (ref 70–99)
Glucose-Capillary: 184 mg/dL — ABNORMAL HIGH (ref 70–99)

## 2020-09-12 LAB — MAGNESIUM: Magnesium: 2 mg/dL (ref 1.7–2.4)

## 2020-09-12 LAB — AMMONIA: Ammonia: 15 umol/L (ref 9–35)

## 2020-09-12 NOTE — Progress Notes (Signed)
PROGRESS NOTE    [name]  LGX:[id number] DOB: 1950/09/06 DOA: 08/23/2020 PCP: Lemmie Evens, MD    Brief Narrative: This 73 y/ofemale admitted to [facility] on 08/23/20 in the setting of septic shock from pneumonia requiring intubation, She had atrial fibrillation at baseline and had been on amiodarone, this was held for concerns of possible amiodarone induced lung toxicity and she was treated with steroids. She was extubated on 08/28/20. However, her mental status declined overnight from 2/26 through early 2/27. A blood gas was obtained which did not show hypercarbia. However after the blood gas was performed patient has worsening of hypoxia and mental status. In consultation with the pulmonary critical care service in [place] it was decided that she should be electively intubated and then transferred to [facility] for  bronchoscopy.  She is s/p bronchoscopy for mucus plugging, she finished abx treatment, then extubated on 3/4, transferred to hospitalist service on 3/6.  She still remains to be drowsy, does not follow commands, opens eyes on calling her name. Neurology consulted. MRI unremarkable.  Patient is having delirium.  Significant Hospital Events:  2/20 Admitted to [facility], intubated 2/22 Stop amiodarone (esr 95) 2/23 Solumedrol trial @ 80 q 12 and reduced to 40 mg q 12h Pm 2/25  2/25 Extubated 2/26 Worsening mental status/respiratory failure 2/27 Transferred to [facility], reintubated, bronched 2/28 Decreasing vent settings 3/01 Failed vent wean 2/2 increased HR/RR 3/4   Extubated , bipap q hs  Assessment & Plan:   Principal Problem:   Lobar pneumonia ([place]) Active Problems:   Hypothyroidism   Hyperlipidemia   Essential hypertension   Atrial fibrillation (HCC)   Congestive heart failure (HCC)   Gastroparesis   Seizure ([place])   Dementia ([place])   DM type 2 (diabetes mellitus, type 2) ([place])   Acute respiratory failure (HCC)   Respiratory failure with hypoxia ([place])    Fibromyalgia   Septic shock ([place])   Acute ischemic stroke ([place])   Acute metabolic encephalopathy  Acute metabolic encephalopathy :  Fluctuating -Daughter reports at baseline, She is alert and independent. -She has been very drowsy , has waxing and waning in her mental status. -ABG showed no PCo2 retention, CT head no acute finding, EEG : No evidence of seizures.  -There is  mild hypernatremia, Delirium could be possibility.  UA unremarkable. -Cortack inserted for nutrition. changed meds to IV or subQ, changed back to po through tube. -Monitor sodium level. 150 >149 > 145 >139 -Neuro checks every 4 hours. -Neurology consulted for further evaluation.  MRI unremarkable. -Neurology recommended to continue supportive care.  Patient is having delirium. -Pulmonology suspect ICU delirium superimposed on underlying dementia. -Continue to hold sedating medications. -Repeat ABG does not show PCO2 retention. -Ammonia slightly elevated. Continue Lactulose  Acute hypoxic respiratory failure due to left mainstem mucous pluging Recent healthcare associated pneumonia:  She has recently finished course of antibiotics Zosyn on 3/2. S/p bronchoscopy on 2/27 with total occlusion of L mainstem with mucous plug, sent for Cx. Continue BiPAP nightly and as needed. Continue Pulmonary toilet (hypertonic saline, guaifenesin,chest PT via bed ). Chest x-ray no infiltrate but consistent with pulmonary interstitial edema. Patient is at very high risk for reintubation.  Chronic A. Fib : Her heart rate remains controlled, amiodarone been resumed, no evidence of fibrosis on CT scan. She was on heparin drip then apixaban, Lovenox switched to Eliquis through tube.  Chronic diastolic CHF  Echo 8/14 with EF of 50-55%. Currently does not appear volume overloaded with  poor oral intake. Continue Lasix 40 mg daily.  Hypokalemia : -Replace K as needed.  Hypernatremia > Improved. Na 150> 149> 145 >139 >  140 Continue free water flush through core track tube if not able to take PO.  Insulin-dependent type 2 diabetes Uncontrolled, A1c 7.9 Continue insulin SS, adjust as needed.  CKD 2/anemia of chronic disease Renal function and hemoglobin appear close to baseline. Monitor  Renal functions  History of seizures Continue Keppra 1000 mg twice daily through tube. EEG completed, no evidence of seizures.  Hypothyroidism Continue Synthroid   Dementia Continue Aricept. Daughter wants Cymbalta to be started.   Obesity: Body mass index is 36.08 kg/m. outpatient PCP follow up.   DVT prophylaxis: Eliquis Code Status: Full code Family Communication: Spoke with Daughter at bed side. Disposition Plan:   Status is: Inpatient  Remains inpatient appropriate because:Inpatient level of care appropriate due to severity of illness  Dispo: The patient is from: Home              Anticipated d/c is to: TBD              Patient currently is not medically stable to d/c.   Difficult to place patient No  Consultants:   Pulm/ Critical care.  Procedures:  ETT 2/20 >>2/25, 2/27 >>3/4, tube feeding while on vent R IJ CVL 2/21 >>   Antimicrobials:  Anti-infectives (From admission, onward)   Start     Dose/Rate Route Frequency Ordered Stop   08/28/20 1900  vancomycin (VANCOREADY) IVPB 1250 mg/250 mL  Status:  Discontinued        1,250 mg 166.7 mL/hr over 90 Minutes Intravenous Every 24 hours 08/27/20 1820 08/28/20 1204   08/27/20 2200  piperacillin-tazobactam (ZOSYN) IVPB 3.375 g        3.375 g 12.5 mL/hr over 240 Minutes Intravenous Every 8 hours 08/27/20 1412 09/03/20 0141   08/27/20 1915  vancomycin (VANCOREADY) IVPB 2000 mg/400 mL        2,000 mg 200 mL/hr over 120 Minutes Intravenous  Once 08/27/20 1816 08/27/20 2156   08/27/20 1430  piperacillin-tazobactam (ZOSYN) IVPB 3.375 g        3.375 g 100 mL/hr over 30 Minutes Intravenous  Once 08/27/20 1412 08/27/20 1502    08/27/20 1000  ceFEPIme (MAXIPIME) 2 g in sodium chloride 0.9 % 100 mL IVPB  Status:  Discontinued        2 g 200 mL/hr over 30 Minutes Intravenous Every 12 hours 08/27/20 0744 08/27/20 1412   08/26/20 1000  ceFEPIme (MAXIPIME) 2 g in sodium chloride 0.9 % 100 mL IVPB  Status:  Discontinued        2 g 200 mL/hr over 30 Minutes Intravenous Every 8 hours 08/26/20 0848 08/27/20 0744   08/25/20 1200  vancomycin (VANCOCIN) IVPB 1000 mg/200 mL premix  Status:  Discontinued        1,000 mg 200 mL/hr over 60 Minutes Intravenous Every 24 hours 08/24/20 1043 08/24/20 1452   08/24/20 1200  vancomycin (VANCOREADY) IVPB 2000 mg/400 mL  Status:  Discontinued        2,000 mg 200 mL/hr over 120 Minutes Intravenous  Once 08/24/20 1009 08/24/20 1452   08/24/20 1045  ceFEPIme (MAXIPIME) 2 g in sodium chloride 0.9 % 100 mL IVPB  Status:  Discontinued        2 g 200 mL/hr over 30 Minutes Intravenous Every 12 hours 08/24/20 0958 08/26/20 0848   08/24/20 0100  ceFEPIme (MAXIPIME) 2 g  in sodium chloride 0.9 % 100 mL IVPB        2 g 200 mL/hr over 30 Minutes Intravenous  Once 08/24/20 0047 08/24/20 2111       Subjective: Patient was seen and examined at bedside.  Overnight events noted.   Patient showed some signs of improvement. She is awake, alert,  following partial commands. Daughter is at bedside, All questions answered.  Pulmonology suspect ICU delirium over underlying dementia.  Objective: Vitals:   09/12/20 1000 09/12/20 1100 09/12/20 1121 09/12/20 1200  BP: (!) 125/95 114/89  124/74  Pulse: 99 69  78  Resp: 13 16  (!) 22  Temp:   98.1 F (36.7 C)   TempSrc:   Oral   SpO2: 98% 100%  99%  Weight:      Height:        Intake/Output Summary (Last 24 hours) at 09/12/2020 1309 Last data filed at 09/12/2020 1200 Gross per 24 hour  Intake 2490 ml  Output 2425 ml  Net 65 ml   Filed Weights   09/10/20 0200 09/11/20 0500 09/12/20 0311  Weight: 100.4 kg 100.4 kg 98.2 kg     Examination:  General exam: Appears calm and comfortable, awake, alert, following partially commands. Respiratory system: Crackles to auscultation. Respiratory effort normal. Cardiovascular system: S1 & S2 heard, RRR. No JVD, murmurs, rubs, gallops or clicks. No pedal edema. Gastrointestinal system: Abdomen is nondistended, soft and nontender. No organomegaly or masses felt.  Normal bowel sounds heard. Central nervous system: Partially awake,  Not assessed. Extremities:  No edema, no cyanosis, no clubbing Skin: No rashes, lesions or ulcers Psychiatry: Judgement and insight appear normal. Mood & affect appropriate.     Data Reviewed: I have personally reviewed following labs and imaging studies  CBC: Recent Labs  Lab 09/08/20 0440 09/09/20 0117 09/10/20 0139 09/11/20 0203 09/12/20 0107  WBC 7.4 6.0 7.2 6.9 7.6  HGB 8.8* 8.5* 8.9* 9.1* 8.7*  HCT 28.4* 27.6* 30.4* 28.8* 29.1*  MCV 107.2* 108.2* 110.1* 106.3* 109.4*  PLT 195 198 202 206 552   Basic Metabolic Panel: Recent Labs  Lab 09/07/20 0114 09/08/20 0440 09/09/20 0117 09/10/20 0139 09/11/20 0203 09/12/20 0107  NA 150* 149* 145 142 139 140  K 4.0 3.0* 3.9 3.8 4.0 4.3  CL 116* 116* 115* 109 105 104  CO2 23 23 21* '26 27 30  ' GLUCOSE 144* 266* 282* 198* 194* 226*  BUN 28* 27* 28* 26* 26* 26*  CREATININE 1.17* 1.10* 1.07* 0.89 0.88 0.91  CALCIUM 9.1 9.4 9.0 9.1 9.3 9.1  MG 2.1 2.1  --  2.1  --  2.0  PHOS  --  3.5  --  4.3  --   --    GFR: Estimated Creatinine Clearance: 67 mL/min (by C-G formula based on SCr of 0.91 mg/dL). Liver Function Tests: Recent Labs  Lab 09/08/20 0440 09/12/20 0107  AST 17 21  ALT 19 21  ALKPHOS 50 42  BILITOT 1.0 0.6  PROT 5.8* 5.5*  ALBUMIN 2.7* 2.7*   No results for input(s): LIPASE, AMYLASE in the last 168 hours. Recent Labs  Lab 09/10/20 0934 09/12/20 0107  AMMONIA 38* 15   Coagulation Profile: No results for input(s): INR, PROTIME in the last 168 hours. Cardiac  Enzymes: No results for input(s): CKTOTAL, CKMB, CKMBINDEX, TROPONINI in the last 168 hours. BNP (last 3 results) No results for input(s): PROBNP in the last 8760 hours. HbA1C: No results for input(s): HGBA1C in the last 72  hours. CBG: Recent Labs  Lab 09/11/20 1917 09/11/20 2310 09/12/20 0307 09/12/20 0722 09/12/20 1106  GLUCAP 157* 188* 206* 115* 166*   Lipid Profile: No results for input(s): CHOL, HDL, LDLCALC, TRIG, CHOLHDL, LDLDIRECT in the last 72 hours. Thyroid Function Tests: No results for input(s): TSH, T4TOTAL, FREET4, T3FREE, THYROIDAB in the last 72 hours. Anemia Panel: No results for input(s): VITAMINB12, FOLATE, FERRITIN, TIBC, IRON, RETICCTPCT in the last 72 hours. Sepsis Labs: No results for input(s): PROCALCITON, LATICACIDVEN in the last 168 hours.  No results found for this or any previous visit (from the past 240 hour(s)).  Radiology Studies: DG Chest Port 1 View  Result Date: 09/11/2020 CLINICAL DATA:  Respiratory failure.  Atelectasis. EXAM: PORTABLE CHEST 1 VIEW COMPARISON:  September 10, 2020 FINDINGS: Enteric tube tip is below the diaphragm. No pneumothorax. Airspace opacity persists in the right upper lobe and left lower lung regions. There is mild right base atelectasis. Small left pleural effusion. There is interstitial edema. There is persistent cardiomegaly with pulmonary venous hypertension. No adenopathy. There is aortic atherosclerosis. There is mitral annulus calcification. No bone lesions. IMPRESSION: Suspect underlying congestive heart failure, stable. Areas of ill-defined airspace opacity in the right upper lobe and left lower lung regions raise concern for multifocal pneumonia superimposed on apparent congestive heart failure. Note that alveolar edema could present in this manner with potential pneumonia alveolar edema presenting currently. Appearance essentially unchanged from 1 day prior. Aortic Atherosclerosis (ICD10-I70.0). Electronically Signed    By: Lowella Grip III M.D.   On: 09/11/2020 07:49    Scheduled Meds: . amiodarone  200 mg Per Tube Daily  . apixaban  5 mg Per Tube BID  . aspirin  81 mg Per Tube Daily  . atorvastatin  80 mg Per Tube Daily  . chlorhexidine  15 mL Mouth Rinse BID  . Chlorhexidine Gluconate Cloth  6 each Topical Daily  . donepezil  5 mg Per Tube QHS  . DULoxetine  30 mg Oral QHS  . feeding supplement (PROSource TF)  45 mL Per Tube BID  . furosemide  40 mg Oral Daily  . guaiFENesin  5 mL Per Tube Q6H  . insulin aspart  0-20 Units Subcutaneous Q4H  . insulin aspart  6 Units Subcutaneous Q4H  . insulin glargine  40 Units Subcutaneous BID  . lactulose  10 g Per Tube BID  . levETIRAcetam  1,000 mg Per Tube BID  . levothyroxine  175 mcg Per Tube Q0600  . mouth rinse  15 mL Mouth Rinse q12n4p  . metoprolol tartrate  12.5 mg Per Tube BID  . sodium chloride HYPERTONIC  4 mL Nebulization Q4H while awake   Continuous Infusions: . sodium chloride Stopped (09/06/20 1228)  . feeding supplement (OSMOLITE 1.2 CAL) 1,000 mL (09/12/20 1007)     LOS: 19 days    Time spent: 25 mins    Nakayla Rorabaugh, MD [facility]   If 7PM-7AM, please contact night-coverage

## 2020-09-12 NOTE — Progress Notes (Signed)
NAME:  [name], MRN:  [id number], DOB:  Nov 30, 1950, LOS: 9 ADMISSION DATE:  08/23/2020, CONSULTATION DATE:  08/30/2020 REFERRING MD:  Tat, CHIEF COMPLAINT:  Dyspnea   Brief History:  70 year old female initially admitted to [facility] 2/20 in the setting of septic shock from pneumonia requiring intubation, (extubated 2/25). Transferred to [facility] 2/27 in the setting of worsening mental status, worsening respiratory failure due to a left lung mucous plug. She had FOB 2/27 and was extubated 3/4 and improved. Tx to [facility] 3/5 but PCCM re-consulted 3/10 in setting AMS.   Past Medical History:  Anemia Barrett's esophagus Systolic heart failure> LVEF 15 to 20% based on recent echocardiogram from [facility]  Diabetes mellitus type 2 Atrial fibrillation Hyperlipidemia Hypertension Seizure disorder Hypothyroidism  Significant Hospital Events:  2/20 Admitted to [facility], intubated 2/22 Stop amiodarone (esr 95) 2/23 Solumedrol trial @ 80 q 12 and reduced to 40 mg q 12h  Pm 2/25  2/25 Extubated 2/26 Worsening mental status/respiratory failure 2/27 Transferred to [facility], reintubated, bronched 2/28 Decreasing vent settings 3/01 Failed vent wean 2/2 increased HR/RR 3/4 extubated , bipap q hs  Consults:  PCCM  Procedures:  ETT 2/20 >> 2/25, 2/27 >>3/4 R IJ CVL 2/21 >>  Significant Diagnostic Tests:  CT Head 2/21 >> mild atrophy, chronic microvascular disease Head CT 2/27 >>No evidence of new or progressive infarction. CT Chest 2/21 >> b/l ASD, small b/l effusions 2D echo 2/26>>> EF was noted to be 50-55% mild MR and mild decrease RV fxn  EEG 3/7>> This study is suggestive of mild to moderate diffuse encephalopathy, nonspecific etiology. No seizures or epileptiform discharges were seen throughout the recording MRI brain 3/8>> neg acute   Micro Data:  2/20 COVID >> negative 2/20 Flu PCR >> negative 2/20 MRSA PCR >> negative 2/22 Resp Cx >> No wbc, few gpc >> abundant e. faecalis 2/22 BC x 2 (while on  cefepime) >> 1/2 pos staph epi  2/22 Urine strep >> neg 2/22 Urine legionella >> neg 2/27 Resp Cx >> rare candida  Antimicrobials:  Vancomycin 2/20 (single dose), 2/24 (single dose) Cefepime 2/20 >> 2/24 Zosyn 2/24 >> 3/2  Interim History / Subjective:  Not getting much sputum out with NTS. Cough is slightly stronger. Daughter at bedside providing history as patient is not answering questions.  Objective   Blood pressure 115/77, pulse 74, temperature 98.2 F (36.8 C), temperature source Oral, resp. rate (!) 25, height _0  (1.676 m), weight 98.2 kg, SpO2 97 %.        Intake/Output Summary (Last 24 hours) at 09/12/2020 0709 Last data filed at 09/12/2020 0600 Gross per 24 hour  Intake 2540 ml  Output 2100 ml  Net 440 ml   Filed Weights   09/10/20 0200 09/11/20 0500 09/12/20 0311  Weight: 100.4 kg 100.4 kg 98.2 kg    General:  Chronically ill, frail appearing elderly woman laying in bed in NAD. HEENT: [place]/AT, eyes anicteric. Neuro: more alert today, tracking during conversation. Moving all extremities spontaneously. CV: S1S2, RRR PULM: breathing comfortably on [place], stronger cough today, able to bring up minimal secretions. No wheezing or rhonchi, rhales in left base. GI: obese, soft, NT, ND Extremities: no peripheral edema, no cyanosis Skin: warm, dry   Resolved Hospital Problem list    Septic shock  Assessment & Plan:   Acute encephalopathy due to delirum - suspect multifactorial in setting ICU delirium (pt has sig hx of same), underlying dementia, recent infection. Of note MRI brain 2/25  showed new acute/subacute 61m R lacunar infarct  -appreciate Neurology's input  - post stroke care with aspirin, statin, Eliquis for Afib. Delirium precautions. -avoid deliriogenic meds, sedating meds  -PT/OT -Continue aricept, cymbalta  -daughter at bedside   Acute respiratory failure with hypoxemia due to left mainstem mucous plug Recent healthcare associated pneumonia - Finished  course of Zosyn 3/2 S/p bronchoscopy 2/27 with total occlusion of L mainstem with mucous plug, sent for Cx. Suspect a component of atelectasis in the left base from cardiomegaly and laying on her left side frequently  P: - Con't aggressive pulmonary toilet (hypertonic saline, guaifenesin, CPT) - OOB mobility - NTS  PR  Dementia: - Continue Aricept   Ok to step down from ICU. Daughter understands she is at increased risk of future pneumonias due to age, weak cough, poor swallowing mechanics. OOB mobility will be very helpful with resolving atelectasis. I anticipate an abnormal CXR until this improves as well as usual delay in radiographic resolution of pneumonia. PCCM will sign off. Please call with questions.  Best practice (evaluated daily)  Diet: TFs Pain/Anxiety/Delirium protocol (if indicated): As above VAP protocol (if indicated): In place DVT prophylaxis: apixaban  GI prophylaxis: PPI Glucose control: SSI, TF coverage and Lantus Mobility: PT Disposition: ok to leave ICU  Goals of Care:  Last date of multidisciplinary goals of care discussion: 2/27 Family and staff present: Dr. TCarles Collet daughter Summary of discussion: full scope of practice Follow up goals of care discussion : 3/4, daughter & RN LLattie Haw, full scope of care including reintubate and trach if needed, -[facility] consultation Code Status: Full    LJulian Hy DO 09/12/20 11:03 AM [facility]  From [place] no response to pager, please call [phone]. After hours, 7PM- 7AM, please call Elink  [phone]

## 2020-09-13 DIAGNOSIS — J181 Lobar pneumonia, unspecified organism: Secondary | ICD-10-CM | POA: Diagnosis not present

## 2020-09-13 LAB — CBC
HCT: 31.2 % — ABNORMAL LOW (ref 36.0–46.0)
Hemoglobin: 9.5 g/dL — ABNORMAL LOW (ref 12.0–15.0)
MCH: 32.8 pg (ref 26.0–34.0)
MCHC: 30.4 g/dL (ref 30.0–36.0)
MCV: 107.6 fL — ABNORMAL HIGH (ref 80.0–100.0)
Platelets: 204 10*3/uL (ref 150–400)
RBC: 2.9 MIL/uL — ABNORMAL LOW (ref 3.87–5.11)
RDW: 17.9 % — ABNORMAL HIGH (ref 11.5–15.5)
WBC: 8.9 10*3/uL (ref 4.0–10.5)
nRBC: 0.2 % (ref 0.0–0.2)

## 2020-09-13 LAB — GLUCOSE, CAPILLARY
Glucose-Capillary: 153 mg/dL — ABNORMAL HIGH (ref 70–99)
Glucose-Capillary: 154 mg/dL — ABNORMAL HIGH (ref 70–99)
Glucose-Capillary: 174 mg/dL — ABNORMAL HIGH (ref 70–99)
Glucose-Capillary: 182 mg/dL — ABNORMAL HIGH (ref 70–99)
Glucose-Capillary: 240 mg/dL — ABNORMAL HIGH (ref 70–99)
Glucose-Capillary: 80 mg/dL (ref 70–99)

## 2020-09-13 NOTE — Progress Notes (Signed)
PROGRESS NOTE    [name]  EAV:[id number] DOB: 1951/03/18 DOA: 08/23/2020 PCP: Lemmie Evens, MD    Brief Narrative: This 10 y/ofemale admitted to [facility] on 08/23/20 in the setting of septic shock from pneumonia requiring intubation, She had atrial fibrillation at baseline and had been on amiodarone, this was held for concerns of possible amiodarone induced lung toxicity and she was treated with steroids. She was extubated on 08/28/20. However, her mental status declined overnight from 2/26 through early 2/27. A blood gas was obtained which did not show hypercarbia. However after the blood gas was performed patient has worsening of hypoxia and mental status. In consultation with the pulmonary critical care service in [place] it was decided that she should be electively intubated and then transferred to [facility] for  bronchoscopy.  She is s/p bronchoscopy for mucus plugging, she finished abx treatment, then extubated on 3/4, transferred to hospitalist service on 3/6.  She still remains to be drowsy, following partial commands, opens eyes on calling her name. Neurology consulted. MRI unremarkable.  Patient is having delirium.  Significant Hospital Events:  2/20 Admitted to [facility], intubated 2/22 Stop amiodarone (esr 95) 2/23 Solumedrol trial @ 80 q 12 and reduced to 40 mg q 12h Pm 2/25  2/25 Extubated 2/26 Worsening mental status/respiratory failure 2/27 Transferred to [facility], reintubated, bronched 2/28 Decreasing vent settings 3/01 Failed vent wean 2/2 increased HR/RR 3/4   Extubated , bipap q hs  Assessment & Plan:   Principal Problem:   Lobar pneumonia ([place]) Active Problems:   Hypothyroidism   Hyperlipidemia   Essential hypertension   Atrial fibrillation (HCC)   Congestive heart failure (HCC)   Gastroparesis   Seizure ([place])   Dementia ([place])   DM type 2 (diabetes mellitus, type 2) ([place])   Acute respiratory failure (HCC)   Respiratory failure with hypoxia ([place])    Fibromyalgia   Septic shock ([place])   Acute ischemic stroke ([place])   Acute metabolic encephalopathy  Acute metabolic encephalopathy :  Fluctuating -Daughter reports at baseline, She is alert and independent. -She has been very drowsy , has waxing and waning in her mental status. -ABG showed no PCo2 retention, CT head no acute finding, EEG : No evidence of seizures.  -There is  mild hypernatremia, Delirium could be possibility.  UA unremarkable. -Cortack inserted for nutrition. changed meds to IV or subQ, changed back to po through tube. -Monitor sodium level. 150 >149 > 145 >139 -Neuro checks every 4 hours. -Neurology consulted for further evaluation.  MRI unremarkable. -Neurology recommended to continue supportive care.  Patient is having delirium. -Pulmonology suspect ICU delirium superimposed on underlying dementia. -Continue to hold sedating medications. -Repeat ABG does not show PCO2 retention. -Ammonia improved. Continue Lactulose.   Acute hypoxic respiratory failure due to left mainstem mucous pluging Recent healthcare associated pneumonia:  She has recently finished course of antibiotics Zosyn on 3/2. S/p bronchoscopy on 2/27 with total occlusion of L mainstem with mucous plug, sent for Cx. Continue BiPAP nightly and as needed. Continue Pulmonary toilet (hypertonic saline, guaifenesin,chest PT via bed ). Chest x-ray no infiltrate but consistent with pulmonary interstitial edema. Patient is at very high risk for reintubation.  Chronic A. Fib : Her heart rate remains controlled, amiodarone been resumed, no evidence of fibrosis on CT scan. She was on heparin drip then apixaban, Lovenox switched to Eliquis through tube.  Chronic diastolic CHF  Echo 7/82 with EF of 50-55%. Currently does not appear volume overloaded with poor  oral intake. Continue Lasix 40 mg daily.  Hypokalemia : > Improved. -Replace K as needed.  Hypernatremia > Improved. Na 150> 149> 145 >139 >  140 Continue free water flush through core track tube if not able to take PO.  Insulin-dependent type 2 diabetes Uncontrolled, A1c 7.9 Continue insulin SS, adjust as needed.  CKD 2/anemia of chronic disease Renal function and hemoglobin appear close to baseline. Monitor  Renal functions  History of seizures Continue Keppra 1000 mg twice daily through tube. EEG completed, no evidence of seizures.  Hypothyroidism Continue Synthroid   Dementia Continue Aricept. Daughter wants Cymbalta to be resumed. Continue Cymbalta.   Obesity: Body mass index is 36.08 kg/m. outpatient PCP follow up.   DVT prophylaxis: Eliquis Code Status: Full code Family Communication: Spoke with Daughter at bed side. Disposition Plan:   Status is: Inpatient  Remains inpatient appropriate because:Inpatient level of care appropriate due to severity of illness  Dispo: The patient is from: Home              Anticipated d/c is to: TBD              Patient currently is not medically stable to d/c.   Difficult to place patient No  Consultants:   Pulm/ Critical care.  Procedures:  ETT 2/20 >>2/25, 2/27 >>3/4, tube feeding while on vent R IJ CVL 2/21 >>   Antimicrobials:  Anti-infectives (From admission, onward)   Start     Dose/Rate Route Frequency Ordered Stop   08/28/20 1900  vancomycin (VANCOREADY) IVPB 1250 mg/250 mL  Status:  Discontinued        1,250 mg 166.7 mL/hr over 90 Minutes Intravenous Every 24 hours 08/27/20 1820 08/28/20 1204   08/27/20 2200  piperacillin-tazobactam (ZOSYN) IVPB 3.375 g        3.375 g 12.5 mL/hr over 240 Minutes Intravenous Every 8 hours 08/27/20 1412 09/03/20 0141   08/27/20 1915  vancomycin (VANCOREADY) IVPB 2000 mg/400 mL        2,000 mg 200 mL/hr over 120 Minutes Intravenous  Once 08/27/20 1816 08/27/20 2156   08/27/20 1430  piperacillin-tazobactam (ZOSYN) IVPB 3.375 g        3.375 g 100 mL/hr over 30 Minutes Intravenous  Once 08/27/20 1412  08/27/20 1502   08/27/20 1000  ceFEPIme (MAXIPIME) 2 g in sodium chloride 0.9 % 100 mL IVPB  Status:  Discontinued        2 g 200 mL/hr over 30 Minutes Intravenous Every 12 hours 08/27/20 0744 08/27/20 1412   08/26/20 1000  ceFEPIme (MAXIPIME) 2 g in sodium chloride 0.9 % 100 mL IVPB  Status:  Discontinued        2 g 200 mL/hr over 30 Minutes Intravenous Every 8 hours 08/26/20 0848 08/27/20 0744   08/25/20 1200  vancomycin (VANCOCIN) IVPB 1000 mg/200 mL premix  Status:  Discontinued        1,000 mg 200 mL/hr over 60 Minutes Intravenous Every 24 hours 08/24/20 1043 08/24/20 1452   08/24/20 1200  vancomycin (VANCOREADY) IVPB 2000 mg/400 mL  Status:  Discontinued        2,000 mg 200 mL/hr over 120 Minutes Intravenous  Once 08/24/20 1009 08/24/20 1452   08/24/20 1045  ceFEPIme (MAXIPIME) 2 g in sodium chloride 0.9 % 100 mL IVPB  Status:  Discontinued        2 g 200 mL/hr over 30 Minutes Intravenous Every 12 hours 08/24/20 0958 08/26/20 0848   08/24/20 0100  ceFEPIme (  MAXIPIME) 2 g in sodium chloride 0.9 % 100 mL IVPB        2 g 200 mL/hr over 30 Minutes Intravenous  Once 08/24/20 0047 08/24/20 8416       Subjective: Patient was seen and examined at bedside.  Overnight events noted.   Patient showed some signs of improvement. She is awake, alert,  following partial commands. She responds " Good morning" Daughter is at bedside, All questions answered.   Pulmonology suspect ICU delirium over underlying dementia.  Objective: Vitals:   09/13/20 0700 09/13/20 0800 09/13/20 0900 09/13/20 0914  BP: (!) 144/70 91/67 138/69 138/69  Pulse: 89 80 84 76  Resp: 13 (!) 27 (!) 23   Temp:  98.2 F (36.8 C)    TempSrc:  Oral    SpO2: 100% 96% 99%   Weight:      Height:        Intake/Output Summary (Last 24 hours) at 09/13/2020 1238 Last data filed at 09/13/2020 0900 Gross per 24 hour  Intake 1170 ml  Output 1151 ml  Net 19 ml   Filed Weights   09/11/20 0500 09/12/20 0311 09/13/20 0105   Weight: 100.4 kg 98.2 kg 98.4 kg    Examination:  General exam: Appears calm and comfortable, awake, alert, following partially commands. Respiratory system: Crackles to auscultation. Respiratory effort normal. Cardiovascular system: S1 & S2 heard, RRR. No JVD, murmurs, rubs, gallops or clicks. No pedal edema. Gastrointestinal system: Abdomen is nondistended, soft and nontender. No organomegaly or masses felt.  Normal bowel sounds heard. Central nervous system: Partially awake,  Not assessed. Extremities:  No edema, no cyanosis, no clubbing Skin: No rashes, lesions or ulcers Psychiatry: Judgement and insight appear normal. Mood & affect appropriate.     Data Reviewed: I have personally reviewed following labs and imaging studies  CBC: Recent Labs  Lab 09/09/20 0117 09/10/20 0139 09/11/20 0203 09/12/20 0107 09/13/20 0329  WBC 6.0 7.2 6.9 7.6 8.9  HGB 8.5* 8.9* 9.1* 8.7* 9.5*  HCT 27.6* 30.4* 28.8* 29.1* 31.2*  MCV 108.2* 110.1* 106.3* 109.4* 107.6*  PLT 198 202 206 191 606   Basic Metabolic Panel: Recent Labs  Lab 09/07/20 0114 09/08/20 0440 09/09/20 0117 09/10/20 0139 09/11/20 0203 09/12/20 0107  NA 150* 149* 145 142 139 140  K 4.0 3.0* 3.9 3.8 4.0 4.3  CL 116* 116* 115* 109 105 104  CO2 23 23 21* _0 GLUCOSE 144* 266* 282* 198* 194* 226*  BUN 28* 27* 28* 26* 26* 26*  CREATININE 1.17* 1.10* 1.07* 0.89 0.88 0.91  CALCIUM 9.1 9.4 9.0 9.1 9.3 9.1  MG 2.1 2.1  --  2.1  --  2.0  PHOS  --  3.5  --  4.3  --   --    GFR: Estimated Creatinine Clearance: 67 mL/min (by C-G formula based on SCr of 0.91 mg/dL). Liver Function Tests: Recent Labs  Lab 09/08/20 0440 09/12/20 0107  AST 17 21  ALT 19 21  ALKPHOS 50 42  BILITOT 1.0 0.6  PROT 5.8* 5.5*  ALBUMIN 2.7* 2.7*   No results for input(s): LIPASE, AMYLASE in the last 168 hours. Recent Labs  Lab 09/10/20 0934 09/12/20 0107  AMMONIA 38* 15   Coagulation Profile: No results for input(s): INR,  PROTIME in the last 168 hours. Cardiac Enzymes: No results for input(s): CKTOTAL, CKMB, CKMBINDEX, TROPONINI in the last 168 hours. BNP (last 3 results) No results for input(s): PROBNP in the last 8760 hours.  HbA1C: No results for input(s): HGBA1C in the last 72 hours. CBG: Recent Labs  Lab 09/12/20 1925 09/12/20 2317 09/13/20 0320 09/13/20 0749 09/13/20 1233  GLUCAP 160* 184* 174* 182* 240*   Lipid Profile: No results for input(s): CHOL, HDL, LDLCALC, TRIG, CHOLHDL, LDLDIRECT in the last 72 hours. Thyroid Function Tests: No results for input(s): TSH, T4TOTAL, FREET4, T3FREE, THYROIDAB in the last 72 hours. Anemia Panel: No results for input(s): VITAMINB12, FOLATE, FERRITIN, TIBC, IRON, RETICCTPCT in the last 72 hours. Sepsis Labs: No results for input(s): PROCALCITON, LATICACIDVEN in the last 168 hours.  No results found for this or any previous visit (from the past 240 hour(s)).  Radiology Studies: No results found.  Scheduled Meds: . amiodarone  200 mg Per Tube Daily  . apixaban  5 mg Per Tube BID  . aspirin  81 mg Per Tube Daily  . atorvastatin  80 mg Per Tube Daily  . chlorhexidine  15 mL Mouth Rinse BID  . Chlorhexidine Gluconate Cloth  6 each Topical Daily  . donepezil  5 mg Per Tube QHS  . DULoxetine  30 mg Oral QHS  . feeding supplement (PROSource TF)  45 mL Per Tube BID  . furosemide  40 mg Oral Daily  . guaiFENesin  5 mL Per Tube Q6H  . insulin aspart  0-20 Units Subcutaneous Q4H  . insulin aspart  6 Units Subcutaneous Q4H  . insulin glargine  40 Units Subcutaneous BID  . lactulose  10 g Per Tube BID  . levETIRAcetam  1,000 mg Per Tube BID  . levothyroxine  175 mcg Per Tube Q0600  . mouth rinse  15 mL Mouth Rinse q12n4p  . metoprolol tartrate  12.5 mg Per Tube BID  . sodium chloride HYPERTONIC  4 mL Nebulization Q4H while awake   Continuous Infusions: . sodium chloride Stopped (09/06/20 1228)  . feeding supplement (OSMOLITE 1.2 CAL) 1,000 mL (09/13/20  0400)     LOS: 20 days    Time spent: 25 mins    PARDEEP KUMAR, MD [facility]   If 7PM-7AM, please contact night-coverage

## 2020-09-13 NOTE — Progress Notes (Signed)
Patient transferred to [facility] via bed accompanied by SWOT RN and daughter. Room sweep for all personal belongings. Flexical kit send as well.

## 2020-09-14 DIAGNOSIS — J181 Lobar pneumonia, unspecified organism: Secondary | ICD-10-CM | POA: Diagnosis not present

## 2020-09-14 LAB — GLUCOSE, CAPILLARY
Glucose-Capillary: 176 mg/dL — ABNORMAL HIGH (ref 70–99)
Glucose-Capillary: 129 mg/dL — ABNORMAL HIGH (ref 70–99)
Glucose-Capillary: 115 mg/dL — ABNORMAL HIGH (ref 70–99)
Glucose-Capillary: 143 mg/dL — ABNORMAL HIGH (ref 70–99)
Glucose-Capillary: 137 mg/dL — ABNORMAL HIGH (ref 70–99)
Glucose-Capillary: 91 mg/dL (ref 70–99)

## 2020-09-14 LAB — BASIC METABOLIC PANEL
Anion gap: 6 (ref 5–15)
BUN: 24 mg/dL — ABNORMAL HIGH (ref 8–23)
CO2: 36 mmol/L — ABNORMAL HIGH (ref 22–32)
Calcium: 9.1 mg/dL (ref 8.9–10.3)
Chloride: 98 mmol/L (ref 98–111)
Creatinine, Ser: 0.94 mg/dL (ref 0.44–1.00)
GFR, Estimated: >60 mL/min (ref >60)
Glucose, Bld: 185 mg/dL — ABNORMAL HIGH (ref 70–99)
Potassium: 4.6 mmol/L (ref 3.5–5.1)
Sodium: 140 mmol/L (ref 135–145)

## 2020-09-14 LAB — HEMOGLOBIN AND HEMATOCRIT, BLOOD
HCT: 30.9 % — ABNORMAL LOW (ref 36.0–46.0)
Hemoglobin: 9.4 g/dL — ABNORMAL LOW (ref 12.0–15.0)

## 2020-09-14 MED ORDER — MELATONIN 5 MG PO TABS: 10.0000 mg | ORAL_TABLET | Freq: Every evening | ORAL | Status: DC | PRN

## 2020-09-14 MED ORDER — MELATONIN 5 MG PO TABS
10.0000 mg | ORAL_TABLET | Freq: Every evening | ORAL | Status: DC | PRN
  Administered 2020-09-15 – 2020-09-22 (×5): 10 mg
  Filled 2020-09-14 (×8): qty 2

## 2020-09-14 NOTE — Progress Notes (Signed)
Physical Therapy Treatment Patient Details Name: [name] MRN: [id number] DOB: [birth year] Today's Date: 09/14/2020    History of Present Illness 70 year old female admitted [facility] 2/20 in the setting of septic shock from pneumonia requiring intubation, (extubated 2/25). MRI Brain 2/25 Acute/subacute 2 mm right centrum semiovale lacunar insult Transferred to [facility] 2/27 with AMS, respiratory failure due to a left lung mucous plug, Intubated 2/27-3/4. 3/8 MRI brain no changes from 2/25  PMhx: HTN, HLD, pulmonary HTN, CHF, AFib, DM, hypothyroidism, OSA    PT Comments    Saw pt after 3 PM when family reports she is more awake. Pt awake throughout session but still does not follow any commands and does not assist with any mobility. Family wants to take home. She will need total care 24/7. Needs to show some progress to continue skilled PT.   Follow Up Recommendations  SNF;Supervision/Assistance - 24 hour (Daughter refusing SNF and reports can provide 24/7 assist)     Equipment Recommendations  Wheelchair (measurements PT);Hospital bed;Other (comment) (hoyer lift)    Recommendations for Other Services       Precautions / Restrictions Precautions Precautions: Fall;Other (comment) Precaution Comments: flexiseal    Mobility  Bed Mobility Overal bed mobility: Needs Assistance Bed Mobility: Sidelying to Sit;Sit to Sidelying   Sidelying to sit: Total assist;+2 for physical assistance;HOB elevated     Sit to sidelying: Total assist;+2 for physical assistance;HOB elevated General bed mobility comments: Assist for all aspects. Pt not assisting at all.    Transfers                 General transfer comment: unable to attempt  Ambulation/Gait                 Stairs             Wheelchair Mobility    Modified Rankin (Stroke Patients Only)       Balance Overall balance assessment: Needs assistance Sitting-balance support: Bilateral upper extremity  supported;Feet supported Sitting balance-Leahy Scale: Poor Sitting balance - Comments: Pt sat EOB x 10 minutes primarily with mod assist. Brief periods of min assist and one time min guard for short time.                                    Cognition Arousal/Alertness: Awake/alert Behavior During Therapy: Flat affect Overall Cognitive Status: Difficult to assess                                 General Comments: Pt awake but not following any commands or vocalizing at all.      Exercises      General Comments        Pertinent Vitals/Pain Pain Assessment: Faces Faces Pain Scale: No hurt    Home Living                      Prior Function            PT Goals (current goals can now be found in the care plan section) Acute Rehab PT Goals Patient Stated Goal: for pt to regain strength (per daughter) Progress towards PT goals: Not progressing toward goals - comment    Frequency    Min 2X/week      PT Plan Current plan remains appropriate    Co-evaluation  AM-PAC PT "6 Clicks" Mobility   Outcome Measure  Help needed turning from your back to your side while in a flat bed without using bedrails?: Total Help needed moving from lying on your back to sitting on the side of a flat bed without using bedrails?: Total Help needed moving to and from a bed to a chair (including a wheelchair)?: Total Help needed standing up from a chair using your arms (e.g., wheelchair or bedside chair)?: Total Help needed to walk in hospital room?: Total Help needed climbing 3-5 steps with a railing? : Total 6 Click Score: 6    End of Session Equipment Utilized During Treatment: Oxygen Activity Tolerance: Other (comment) (Limited by cognition) Patient left: in bed;with call bell/phone within reach;with family/visitor present;with bed alarm set   PT Visit Diagnosis: Other abnormalities of gait and mobility (R26.89);Muscle weakness  (generalized) (M62.81);Difficulty in walking, not elsewhere classified (R26.2)     Time: 0160-1093 PT Time Calculation (min) (ACUTE ONLY): 22 min  Charges:  $Therapeutic Activity: 8-22 mins                     [facility] PT Acute Rehabilitation Services Pager [phone] Office [phone]    Angelina [facility] 09/14/2020, 5:45 PM

## 2020-09-14 NOTE — Progress Notes (Signed)
PROGRESS NOTE    [name]  INO:[id number] DOB: August 03, 1950 DOA: 08/23/2020 PCP: Lemmie Evens, MD    Brief Narrative: This 48 y/ofemale admitted to [facility] on 08/23/20 in the setting of septic shock from pneumonia requiring intubation, She had atrial fibrillation at baseline and had been on amiodarone, this was held for concerns of possible amiodarone induced lung toxicity and she was treated with steroids. She was extubated on 08/28/20. However, her mental status declined overnight from 2/26 through early 2/27. A blood gas was obtained which did not show hypercarbia. However after the blood gas was performed patient has worsening of hypoxia and mental status. In consultation with the pulmonary critical care service in [place] it was decided that she should be electively intubated and then transferred to [facility] for  bronchoscopy.  She is s/p bronchoscopy for mucus plugging, she finished abx treatment, then extubated on 3/4, transferred to hospitalist service on 3/6.  She still remains to be drowsy, following partial commands, opens eyes on calling her name. Neurology consulted. MRI unremarkable.  Patient is having delirium.  Significant Hospital Events:  2/20 Admitted to [facility], intubated 2/22 Stop amiodarone (esr 95) 2/23 Solumedrol trial @ 80 q 12 and reduced to 40 mg q 12h Pm 2/25  2/25 Extubated 2/26 Worsening mental status/respiratory failure 2/27 Transferred to [facility], reintubated, bronched 2/28 Decreasing vent settings 3/01 Failed vent wean 2/2 increased HR/RR 3/4   Extubated , bipap q hs  Assessment & Plan:   Principal Problem:   Lobar pneumonia ([place]) Active Problems:   Hypothyroidism   Hyperlipidemia   Essential hypertension   Atrial fibrillation (HCC)   Congestive heart failure (HCC)   Gastroparesis   Seizure ([place])   Dementia ([place])   DM type 2 (diabetes mellitus, type 2) ([place])   Acute respiratory failure (HCC)   Respiratory failure with hypoxia ([place])    Fibromyalgia   Septic shock ([place])   Acute ischemic stroke ([place])   Acute metabolic encephalopathy  Acute metabolic encephalopathy :  Improving -Daughter reports at baseline, She is alert and independent. -She has been very drowsy , has waxing and waning in her mental status. -ABG showed no PCo2 retention, CT head no acute finding, EEG : No evidence of seizures.  -There is  mild hypernatremia, Delirium could be possibility.  UA unremarkable. -Cortack inserted for nutrition. changed meds to IV or subQ, changed back to po through tube. -Monitor sodium level. 150 >149 > 145 >139 -Neuro checks every 4 hours. -Neurology consulted for further evaluation.  MRI No acute stroke.. -Neurology recommended to continue supportive care.  Patient is having delirium. -Pulmonology suspect ICU delirium superimposed on underlying dementia. -Continue to hold sedating medications. -Repeat ABG does not show PCO2 retention. -Ammonia improved. Continue Lactulose. -Speech  evaluation recommended NPO except small amount of pured solids and thin liquids as needed when awake.  Acute hypoxic respiratory failure due to left mainstem mucous pluging Recent healthcare associated pneumonia:  She has recently finished course of antibiotics Zosyn on 3/2. S/p bronchoscopy on 2/27 with total occlusion of L mainstem with mucous plug, sent for Cx. Continue BiPAP nightly and as needed. Continue Pulmonary toilet (hypertonic saline, guaifenesin,chest PT via bed ). Chest x-ray no infiltrate but consistent with pulmonary interstitial edema. Patient is at very high risk for reintubation.  Chronic A. Fib : Her heart rate remains controlled, amiodarone been resumed, no evidence of fibrosis on CT scan. She was on heparin drip then apixaban, Lovenox switched to Eliquis through tube.  Chronic diastolic CHF  Echo 8/09 with EF of 50-55%. Currently does not appear volume overloaded with poor oral intake. Continue Lasix 40 mg  daily.  Hypokalemia : > Improved. -Replace K as needed.  Hypernatremia > Improved. Na 150> 149> 145 >139 > 140 Continue free water flush through core track tube if not able to take PO.  Insulin-dependent type 2 diabetes Uncontrolled, A1c 7.9 Continue insulin SS, adjust as needed.  CKD 2/anemia of chronic disease Renal function and hemoglobin appear close to baseline. Monitor  Renal functions  History of seizures Continue Keppra 1000 mg twice daily through tube. EEG completed, no evidence of seizures.  Hypothyroidism Continue Synthroid   Dementia Continue Aricept. Daughter wants Cymbalta to be resumed. Continue Cymbalta.   Obesity: Body mass index is 36.08 kg/m. outpatient PCP follow up.   DVT prophylaxis: Eliquis Code Status: Full code Family Communication: Spoke with Daughter at bed side. Disposition Plan:   Status is: Inpatient  Remains inpatient appropriate because:Inpatient level of care appropriate due to severity of illness  Dispo: The patient is from: Home              Anticipated d/c is to: TBD              Patient currently is not medically stable to d/c.   Difficult to place patient No  Consultants:   Pulm/ Critical care.  Procedures:  ETT 2/20 >>2/25, 2/27 >>3/4, tube feeding while on vent R IJ CVL 2/21 >>   Antimicrobials:  Anti-infectives (From admission, onward)   Start     Dose/Rate Route Frequency Ordered Stop   08/28/20 1900  vancomycin (VANCOREADY) IVPB 1250 mg/250 mL  Status:  Discontinued        1,250 mg 166.7 mL/hr over 90 Minutes Intravenous Every 24 hours 08/27/20 1820 08/28/20 1204   08/27/20 2200  piperacillin-tazobactam (ZOSYN) IVPB 3.375 g        3.375 g 12.5 mL/hr over 240 Minutes Intravenous Every 8 hours 08/27/20 1412 09/03/20 0141   08/27/20 1915  vancomycin (VANCOREADY) IVPB 2000 mg/400 mL        2,000 mg 200 mL/hr over 120 Minutes Intravenous  Once 08/27/20 1816 08/27/20 2156   08/27/20 1430   piperacillin-tazobactam (ZOSYN) IVPB 3.375 g        3.375 g 100 mL/hr over 30 Minutes Intravenous  Once 08/27/20 1412 08/27/20 1502   08/27/20 1000  ceFEPIme (MAXIPIME) 2 g in sodium chloride 0.9 % 100 mL IVPB  Status:  Discontinued        2 g 200 mL/hr over 30 Minutes Intravenous Every 12 hours 08/27/20 0744 08/27/20 1412   08/26/20 1000  ceFEPIme (MAXIPIME) 2 g in sodium chloride 0.9 % 100 mL IVPB  Status:  Discontinued        2 g 200 mL/hr over 30 Minutes Intravenous Every 8 hours 08/26/20 0848 08/27/20 0744   08/25/20 1200  vancomycin (VANCOCIN) IVPB 1000 mg/200 mL premix  Status:  Discontinued        1,000 mg 200 mL/hr over 60 Minutes Intravenous Every 24 hours 08/24/20 1043 08/24/20 1452   08/24/20 1200  vancomycin (VANCOREADY) IVPB 2000 mg/400 mL  Status:  Discontinued        2,000 mg 200 mL/hr over 120 Minutes Intravenous  Once 08/24/20 1009 08/24/20 1452   08/24/20 1045  ceFEPIme (MAXIPIME) 2 g in sodium chloride 0.9 % 100 mL IVPB  Status:  Discontinued        2 g 200  mL/hr over 30 Minutes Intravenous Every 12 hours 08/24/20 0958 08/26/20 0848   08/24/20 0100  ceFEPIme (MAXIPIME) 2 g in sodium chloride 0.9 % 100 mL IVPB        2 g 200 mL/hr over 30 Minutes Intravenous  Once 08/24/20 0047 08/24/20 2336       Subjective: Patient was seen and examined at bedside.  Overnight events noted.   Patient showed some signs of improvement. She is awake, alert,  following partial commands. She responds " Fine " when asked how are you doing ? Daughter is at bedside, All questions answered.     Objective: Vitals:   09/13/20 2342 09/14/20 0400 09/14/20 0800 09/14/20 1200  BP: 123/82 132/88 121/78 (!) 132/98  Pulse: 78 92 82 92  Resp: 17 17 (!) 22 18  Temp: 98.4 F (36.9 C) (!) 97.5 F (36.4 C) 97.7 F (36.5 C) 97.8 F (36.6 C)  TempSrc: Axillary Axillary Axillary Axillary  SpO2: 100% 99% 99% 94%  Weight:  100.5 kg    Height:        Intake/Output Summary (Last 24 hours) at  09/14/2020 1450 Last data filed at 09/14/2020 0600 Gross per 24 hour  Intake 900 ml  Output 600 ml  Net 300 ml   Filed Weights   09/12/20 0311 09/13/20 0105 09/14/20 0400  Weight: 98.2 kg 98.4 kg 100.5 kg    Examination:  General exam: Appears calm and comfortable, awake, alert, following partially commands. Respiratory system: Crackles to auscultation. Respiratory effort normal. Cardiovascular system: S1 & S2 heard, RRR. No JVD, murmurs, rubs, gallops or clicks. No pedal edema. Gastrointestinal system: Abdomen is nondistended, soft and nontender. No organomegaly or masses felt.  Normal bowel sounds heard. Central nervous system: Partially awake,  Not assessed. Extremities:  No edema, no cyanosis, no clubbing Skin: No rashes, lesions or ulcers Psychiatry: Judgement and insight appear normal. Mood & affect appropriate.     Data Reviewed: I have personally reviewed following labs and imaging studies  CBC: Recent Labs  Lab 09/09/20 0117 09/10/20 0139 09/11/20 0203 09/12/20 0107 09/13/20 0329 09/14/20 0124  WBC 6.0 7.2 6.9 7.6 8.9  --   HGB 8.5* 8.9* 9.1* 8.7* 9.5* 9.4*  HCT 27.6* 30.4* 28.8* 29.1* 31.2* 30.9*  MCV 108.2* 110.1* 106.3* 109.4* 107.6*  --   PLT 198 202 206 191 204  --    Basic Metabolic Panel: Recent Labs  Lab 09/08/20 0440 09/09/20 0117 09/10/20 0139 09/11/20 0203 09/12/20 0107 09/14/20 0124  NA 149* 145 142 139 140 140  K 3.0* 3.9 3.8 4.0 4.3 4.6  CL 116* 115* 109 105 104 98  CO2 23 21* _0 36*  GLUCOSE 266* 282* 198* 194* 226* 185*  BUN 27* 28* 26* 26* 26* 24*  CREATININE 1.10* 1.07* 0.89 0.88 0.91 0.94  CALCIUM 9.4 9.0 9.1 9.3 9.1 9.1  MG 2.1  --  2.1  --  2.0  --   PHOS 3.5  --  4.3  --   --   --    GFR: Estimated Creatinine Clearance: 65.7 mL/min (by C-G formula based on SCr of 0.94 mg/dL). Liver Function Tests: Recent Labs  Lab 09/08/20 0440 09/12/20 0107  AST 17 21  ALT 19 21  ALKPHOS 50 42  BILITOT 1.0 0.6  PROT 5.8*  5.5*  ALBUMIN 2.7* 2.7*   No results for input(s): LIPASE, AMYLASE in the last 168 hours. Recent Labs  Lab 09/10/20 0934 09/12/20 0107  AMMONIA 38* 15  Coagulation Profile: No results for input(s): INR, PROTIME in the last 168 hours. Cardiac Enzymes: No results for input(s): CKTOTAL, CKMB, CKMBINDEX, TROPONINI in the last 168 hours. BNP (last 3 results) No results for input(s): PROBNP in the last 8760 hours. HbA1C: No results for input(s): HGBA1C in the last 72 hours. CBG: Recent Labs  Lab 09/13/20 1937 09/13/20 2342 09/14/20 0418 09/14/20 0814 09/14/20 1323  GLUCAP 80 153* 176* 129* 115*   Lipid Profile: No results for input(s): CHOL, HDL, LDLCALC, TRIG, CHOLHDL, LDLDIRECT in the last 72 hours. Thyroid Function Tests: No results for input(s): TSH, T4TOTAL, FREET4, T3FREE, THYROIDAB in the last 72 hours. Anemia Panel: No results for input(s): VITAMINB12, FOLATE, FERRITIN, TIBC, IRON, RETICCTPCT in the last 72 hours. Sepsis Labs: No results for input(s): PROCALCITON, LATICACIDVEN in the last 168 hours.  No results found for this or any previous visit (from the past 240 hour(s)).  Radiology Studies: No results found.  Scheduled Meds: . amiodarone  200 mg Per Tube Daily  . apixaban  5 mg Per Tube BID  . aspirin  81 mg Per Tube Daily  . atorvastatin  80 mg Per Tube Daily  . chlorhexidine  15 mL Mouth Rinse BID  . Chlorhexidine Gluconate Cloth  6 each Topical Daily  . donepezil  5 mg Per Tube QHS  . DULoxetine  30 mg Oral QHS  . feeding supplement (PROSource TF)  45 mL Per Tube BID  . furosemide  40 mg Oral Daily  . guaiFENesin  5 mL Per Tube Q6H  . insulin aspart  0-20 Units Subcutaneous Q4H  . insulin aspart  6 Units Subcutaneous Q4H  . insulin glargine  40 Units Subcutaneous BID  . lactulose  10 g Per Tube BID  . levETIRAcetam  1,000 mg Per Tube BID  . levothyroxine  175 mcg Per Tube Q0600  . mouth rinse  15 mL Mouth Rinse q12n4p  . metoprolol tartrate  12.5  mg Per Tube BID   Continuous Infusions: . sodium chloride Stopped (09/06/20 1228)  . feeding supplement (OSMOLITE 1.2 CAL) 1,000 mL (09/13/20 2213)     LOS: 21 days    Time spent: 25 mins    PARDEEP KUMAR, MD [facility]   If 7PM-7AM, please contact night-coverage

## 2020-09-14 NOTE — Discharge Instructions (Signed)

## 2020-09-14 NOTE — Progress Notes (Signed)
  Speech Language Pathology Treatment: Dysphagia  Patient Details Name: [name] MRN: [id number] DOB: 1950-08-11 Today's Date: 09/14/2020 Time: 0093-8182 SLP Time Calculation (min) (ACUTE ONLY): 25 min  Assessment / Plan / Recommendation Clinical Impression  Patient seen for PO trials with daughter present in room and available for education regarding swallow safety. Daughter assisted SLP in repositioning patient from sidelying to lying relatively flat in middle of bed so HOB could be elevated. Patient exhibited adequate oral preparatory phase of swallow with spoon bites of applesauce and cup sips of thin liquids. She did exhibit multiple swallows (2-3) with boluses as well as suspected delayed swallow initiation, however she did not exhibit any overt s/s of aspiration or penetration. She was able to maintain adequate alertness although she did require cues from SLP and daughter as she would try to return to sidelying position. Patient did not make any attempts to verbally or nonverbally communicate with daughter or SLP. SLP educated daughter and updated swallow sign to allow for small amounts puree solids and thin liquids when patient alert. SLP informed RN that patient's daughter may feed her PO's.   HPI HPI: Pt is a 70 y/o female with systolic and diastolic heart failure, diabetes, A. fib, hypertension, dementia, seizure disorder.  She was admitted to [facility] 2/20 with E faecalis left lower lobe pneumonia and septic shock requiring intubation 2/20-2/25 but decompensated and required reintubation 2/27-3/4. She had left lower lobe and partial left upper lobe collapse due to mucous plugging, improved after bronchoscopy 2/27. MRI and EEG were negative. Multidisciplinary goals of care discussion 2/27 and follow up on 3/4: full scope of care including reintubate and trach if needed. CXR 3/11: Suspect underlying congestive heart failure, stable. Areas of  ill-defined airspace opacity in the right upper  lobe and left lower  lung regions raise concern for multifocal pneumonia superimposed on  apparent congestive heart failure.      SLP Plan  Continue with current plan of care       Recommendations  Diet recommendations: Other(comment);NPO (NPO except small amount PO's of puree solids and thin liquids PRN when alert) Liquids provided via: Cup;Straw Medication Administration: Via alternative means Supervision: Full supervision/cueing for compensatory strategies;Trained caregiver to feed patient Compensations: Slow rate;Small sips/bites;Minimize environmental distractions Postural Changes and/or Swallow Maneuvers: Seated upright 90 degrees                Oral Care Recommendations: Oral care QID Follow up Recommendations: Home health SLP;Skilled Nursing facility;24 hour supervision/assistance SLP Visit Diagnosis: Dysphagia, unspecified (R13.10) Plan: Continue with current plan of care       GO                Angela Nevin, MA, CCC-SLP Speech Therapy

## 2020-09-15 DIAGNOSIS — J181 Lobar pneumonia, unspecified organism: Secondary | ICD-10-CM | POA: Diagnosis not present

## 2020-09-15 LAB — GLUCOSE, CAPILLARY
Glucose-Capillary: 144 mg/dL — ABNORMAL HIGH (ref 70–99)
Glucose-Capillary: 154 mg/dL — ABNORMAL HIGH (ref 70–99)
Glucose-Capillary: 176 mg/dL — ABNORMAL HIGH (ref 70–99)
Glucose-Capillary: 79 mg/dL (ref 70–99)
Glucose-Capillary: 95 mg/dL (ref 70–99)
Glucose-Capillary: 99 mg/dL (ref 70–99)

## 2020-09-15 MED ORDER — INSULIN ASPART 100 UNIT/ML ~~LOC~~ SOLN
4.0000 [IU] | SUBCUTANEOUS | Status: DC
  Administered 2020-09-15 – 2020-09-18 (×12): 4 [IU] via SUBCUTANEOUS

## 2020-09-15 NOTE — Progress Notes (Addendum)
[facility] PROGRESS NOTE  [name] NLZ:[id number] DOB: 1951-05-08 DOA: 08/23/2020 PCP: Lemmie Evens, MD  Status:  Remains inpatient appropriate because:Altered mental status, Unsafe d/c plan, IV treatments appropriate due to intensity of illness or inability to take PO and Inpatient level of care appropriate due to severity of illness   Dispo: The patient is from: Home              Anticipated d/c is to: Home-may be a candidate for LTAC but eventual discharge disposition Home              Patient currently is not medically stable to d/c.   Difficult to place patient Yes    Level of care: Progressive  Code Status: Full Family Communication: Daughter at bedside DVT prophylaxis: Eliquis Vaccination status: Unknown  Foley catheter: Purewick catheter   HPI: 60 y/ofemale admitted to [facility] on 08/23/20 in the setting of septic shock from pneumonia requiring intubation,She had atrial fibrillation at baseline and had been on amiodarone,this was held for concerns of possible amiodarone induced lung toxicity and she was treated with steroids. She wasextubated on 08/28/20.However,her mental status declined overnight from 2/26 through early 2/27. A blood gas was obtained which did not show hypercarbia. However after the blood gas was performed patient has worsening of hypoxia and mental status. In consultation with the pulmonary critical care service in [place] it was decided that she should be electively intubated and then transferred to [facility] for  bronchoscopy. She is s/p bronchoscopy for mucus plugging, she finished abx treatment, then extubated on 3/4, transferred to hospitalist service on 3/6.  She still remains to be drowsy, following partial commands, opens eyes on calling her name. Neurology consulted. MRI unremarkable.  Patient is having delirium.  Significant Hospital Events:  2/20 Admitted to [facility], intubated 2/22 Stop amiodarone (esr 95) 2/23  Solumedrol trial @ 80 q 12 and reduced to 40 mg q 12h Pm 2/25  2/25 Extubated 2/26 Worsening mental status/respiratory failure 2/27 Transferred to [facility], reintubated, bronched 2/28 Decreasing vent settings 3/01 Failed vent wean 2/2 increased HR/RR 3/4   Extubated , bipap q hs    Subjective: Patient awake.  Making appropriate eye contact.  If spoken to very slowly limited amount of language patient is able to comprehend what is being said to her and respond appropriately.  She stuck out her tongue on command and was able to say yes when asked questions.  She was also able to follow some other simple commands although she has overall severe physical deconditioning.  Objective: Vitals:   09/14/20 2326 09/15/20 0336  BP: 116/70 107/75  Pulse: 87 85  Resp: 19 20  Temp: 98.1 F (36.7 C) 98.1 F (36.7 C)  SpO2: 100% 100%    Intake/Output Summary (Last 24 hours) at 09/15/2020 0817 Last data filed at 09/15/2020 0400 Gross per 24 hour  Intake 1500 ml  Output 1100 ml  Net 400 ml   Filed Weights   09/12/20 0311 09/13/20 0105 09/14/20 0400  Weight: 98.2 kg 98.4 kg 100.5 kg    Exam:  Constitutional: No acute distress, calm, Respiratory: Posterior lung sounds are clear to auscultation.  Patient is stable on room air maintaining O2 saturations between 96 and 100%.  No increased work of breathing. Cardiovascular: Pulse is regular.  Essentially nontachycardic except with activity.  Soft nonpitting bilateral lower extremity peripheral edema.  No JVD. Abdomen: Cortrack tube in place for tube feedings.  Just recently passed swallowing evaluation and  is on pured diet.  Bowel sounds are present and abdomen is nontender.  Has rectal Foley in place Musculoskeletal: No contractures. Neurologic: CN 2-12 grossly intact. Sensation intact, DTR normal. Strength 1-2/5 x all 4 extremities.  Psychiatric: Awake, inconsistently follows some simple commands.  Oriented definitely to name but given limited  ability to verbally respond unable to accurately assess orientation.   Assessment/Plan: Acute problems:  Acute metabolic encephalopathy/Hypernatremia:  Improving -Daughter reports at baseline patient is alert and independent -ABG showed no PCo2 retention, CT head no acute finding, EEG : No evidence of seizures.   -Monitor sodium level. 150 >149 > 145 >139 -Continue neuro checks every 4 hours. -3/8 repeat MRI unremarkable -Neurology recommended to continue supportive care and suspect patient's persistent encephalopathy related to delirium in context of underlying dementia. -Continue to hold sedating medications. -Ammonia improved. Continue Lactulose. -Continue Keppra for seizure prophylaxis  Acute hypoxic respiratory failure due to left mainstem mucous pluging Recent healthcare associated pneumonia:  She has recently finished course of antibiotics Zosyn on 3/2. S/p bronchoscopy on 2/27 with total occlusion of L mainstem with mucous plug, sent for Cx. Unable to utilize BiPAP or CPAP at bedtime given requirement for cortrack to dust inability to obtain an accurate seal for the mask Continue Pulmonary toilet (hypertonic saline, guaifenesin,chest PT via bed ). Chest x-ray no infiltrate but consistent with pulmonary interstitial edema.  Chronic A. Fib/acquired thrombophilia: Her heart rate remains controlled,amiodarone been resumed, no evidence of fibrosis on CT scan. Continue apixaban  Dysphagia/obesity Secondary to persistent metabolic encephalopathy Primary nutrition per tube but is allowed small amounts of pured diet as mentation allows Nutrition Problem: Inadequate oral intake Etiology: inability to eat Signs/Symptoms: NPO status Interventions: Tube feeding,Prostat Estimated body mass index is 35.76 kg/m as calculated from the following:   Height as of this encounter: '5\' 6"'  (1.676 m).   Weight as of this encounter: 100.5 kg.  Chronic diastolic CHF Echo 3/33 with EF of  50-55%. Remains euvolemic Continue Lasix 40 mg daily and metoprolol 12.5 mg twice daily. Not on ACE or ARB 2/2 chronic kidney disease  Insulin-dependent type 2 diabetes Uncontrolled, A1c 7.9 Continue Lantus hours while on tube feedings in addition to SSI Did have some borderline hypoglycemia in the past 24 hours therefore will decrease scheduled NovoLog to 4 units every 4 hours     Other problems: CKD 2/anemia of chronic disease Renal function and hemoglobin appear close to baseline. Monitor  Renal functions  History of seizures Continue Keppra 1000 mg twice daily through tube. EEG completed, no evidence of seizures.  Hypothyroidism Continue Synthroid   Dementia Continue Aricept. Continue Cymbalta.  Hypokalemia : > Improved. -Replace K as needed.  Hypernatremia > Improved. Na 150> 149> 145 >139 > 140 Continue free water flush through core track tube if not able to take PO   Data Reviewed: Basic Metabolic Panel: Recent Labs  Lab 09/09/20 0117 09/10/20 0139 09/11/20 0203 09/12/20 0107 09/14/20 0124  NA 145 142 139 140 140  K 3.9 3.8 4.0 4.3 4.6  CL 115* 109 105 104 98  CO2 21* '26 27 30 ' 36*  GLUCOSE 282* 198* 194* 226* 185*  BUN 28* 26* 26* 26* 24*  CREATININE 1.07* 0.89 0.88 0.91 0.94  CALCIUM 9.0 9.1 9.3 9.1 9.1  MG  --  2.1  --  2.0  --   PHOS  --  4.3  --   --   --    Liver Function Tests: Recent Labs  Lab  09/12/20 0107  AST 21  ALT 21  ALKPHOS 42  BILITOT 0.6  PROT 5.5*  ALBUMIN 2.7*   No results for input(s): LIPASE, AMYLASE in the last 168 hours. Recent Labs  Lab 09/10/20 0934 09/12/20 0107  AMMONIA 38* 15   CBC: Recent Labs  Lab 09/09/20 0117 09/10/20 0139 09/11/20 0203 09/12/20 0107 09/13/20 0329 09/14/20 0124  WBC 6.0 7.2 6.9 7.6 8.9  --   HGB 8.5* 8.9* 9.1* 8.7* 9.5* 9.4*  HCT 27.6* 30.4* 28.8* 29.1* 31.2* 30.9*  MCV 108.2* 110.1* 106.3* 109.4* 107.6*  --   PLT 198 202 206 191 204  --    Cardiac Enzymes: No results  for input(s): CKTOTAL, CKMB, CKMBINDEX, TROPONINI in the last 168 hours. BNP (last 3 results) Recent Labs    11/16/19 1103  BNP 263.0*    ProBNP (last 3 results) No results for input(s): PROBNP in the last 8760 hours.  CBG: Recent Labs  Lab 09/14/20 1730 09/14/20 2009 09/14/20 2328 09/15/20 0337 09/15/20 0737  GLUCAP 143* 137* 91 79 154*    No results found for this or any previous visit (from the past 240 hour(s)).   Studies: No results found.  Scheduled Meds: . amiodarone  200 mg Per Tube Daily  . apixaban  5 mg Per Tube BID  . aspirin  81 mg Per Tube Daily  . atorvastatin  80 mg Per Tube Daily  . chlorhexidine  15 mL Mouth Rinse BID  . Chlorhexidine Gluconate Cloth  6 each Topical Daily  . donepezil  5 mg Per Tube QHS  . DULoxetine  30 mg Oral QHS  . feeding supplement (PROSource TF)  45 mL Per Tube BID  . furosemide  40 mg Oral Daily  . guaiFENesin  5 mL Per Tube Q6H  . insulin aspart  0-20 Units Subcutaneous Q4H  . insulin aspart  6 Units Subcutaneous Q4H  . insulin glargine  40 Units Subcutaneous BID  . lactulose  10 g Per Tube BID  . levETIRAcetam  1,000 mg Per Tube BID  . levothyroxine  175 mcg Per Tube Q0600  . mouth rinse  15 mL Mouth Rinse q12n4p  . metoprolol tartrate  12.5 mg Per Tube BID   Continuous Infusions: . sodium chloride Stopped (09/06/20 1228)  . feeding supplement (OSMOLITE 1.2 CAL) 1,000 mL (09/14/20 1829)    Principal Problem:   Lobar pneumonia ([place]) Active Problems:   Hypothyroidism   Hyperlipidemia   Essential hypertension   Atrial fibrillation (HCC)   Congestive heart failure (HCC)   Gastroparesis   Seizure (HCC)   Dementia ([place])   DM type 2 (diabetes mellitus, type 2) ([place])   Acute respiratory failure (HCC)   Respiratory failure with hypoxia ([place])   Fibromyalgia   Septic shock ([place])   Acute ischemic stroke ([place])   Acute metabolic encephalopathy   Consultants:  PCCM  Neurology  Procedures: ETT 2/20 >>2/25,  2/27 >>3/4, tube feeding while on vent R IJ CVL 2/21 >>  Antibiotics: Anti-infectives (From admission, onward)   Start     Dose/Rate Route Frequency Ordered Stop   08/28/20 1900  vancomycin (VANCOREADY) IVPB 1250 mg/250 mL  Status:  Discontinued        1,250 mg 166.7 mL/hr over 90 Minutes Intravenous Every 24 hours 08/27/20 1820 08/28/20 1204   08/27/20 2200  piperacillin-tazobactam (ZOSYN) IVPB 3.375 g        3.375 g 12.5 mL/hr over 240 Minutes Intravenous Every 8 hours 08/27/20 1412 09/03/20  0141   08/27/20 1915  vancomycin (VANCOREADY) IVPB 2000 mg/400 mL        2,000 mg 200 mL/hr over 120 Minutes Intravenous  Once 08/27/20 1816 08/27/20 2156   08/27/20 1430  piperacillin-tazobactam (ZOSYN) IVPB 3.375 g        3.375 g 100 mL/hr over 30 Minutes Intravenous  Once 08/27/20 1412 08/27/20 1502   08/27/20 1000  ceFEPIme (MAXIPIME) 2 g in sodium chloride 0.9 % 100 mL IVPB  Status:  Discontinued        2 g 200 mL/hr over 30 Minutes Intravenous Every 12 hours 08/27/20 0744 08/27/20 1412   08/26/20 1000  ceFEPIme (MAXIPIME) 2 g in sodium chloride 0.9 % 100 mL IVPB  Status:  Discontinued        2 g 200 mL/hr over 30 Minutes Intravenous Every 8 hours 08/26/20 0848 08/27/20 0744   08/25/20 1200  vancomycin (VANCOCIN) IVPB 1000 mg/200 mL premix  Status:  Discontinued        1,000 mg 200 mL/hr over 60 Minutes Intravenous Every 24 hours 08/24/20 1043 08/24/20 1452   08/24/20 1200  vancomycin (VANCOREADY) IVPB 2000 mg/400 mL  Status:  Discontinued        2,000 mg 200 mL/hr over 120 Minutes Intravenous  Once 08/24/20 1009 08/24/20 1452   08/24/20 1045  ceFEPIme (MAXIPIME) 2 g in sodium chloride 0.9 % 100 mL IVPB  Status:  Discontinued        2 g 200 mL/hr over 30 Minutes Intravenous Every 12 hours 08/24/20 0958 08/26/20 0848   08/24/20 0100  ceFEPIme (MAXIPIME) 2 g in sodium chloride 0.9 % 100 mL IVPB        2 g 200 mL/hr over 30 Minutes Intravenous  Once 08/24/20 0047 08/24/20 0219         Time spent: 35 minutes    Erin Hearing ANP  [facility] 7 am - 330 pm/M-F for direct patient care and secure chat Please refer to Amion for contact info 22  days

## 2020-09-15 NOTE — TOC Progression Note (Signed)
Transition of Care [facility]) - Progression Note    Patient Details  Name: [name] MRN: [id number] Date of Birth: 1950-11-13  Transition of Care [facility]) CM/SW Contact  Lawerance Sabal, RN Phone Number: 09/15/2020, 10:52 AM  Clinical Narrative:    Vilinda Boehringer of [facility] who clarified that he cannot accept for home health services. Patient is now being followed by difficult to place team; will defer DC planning to them.    Expected Discharge Plan: Home w Home Health Services Barriers to Discharge: Continued Medical Work up  Expected Discharge Plan and Services Expected Discharge Plan: Home w Home Health Services In-house Referral: Clinical Social Work     Living arrangements for the past 2 months: Single Family Home                             HH Agency: [facility]         Social Determinants of Health (SDOH) Interventions    Readmission Risk Interventions No flowsheet data found.

## 2020-09-15 NOTE — Progress Notes (Signed)
  Speech Language Pathology Treatment: Dysphagia  Patient Details Name: [name] MRN: [id number] DOB: [birth year] Today's Date: 09/15/2020 Time: 9509-3267 SLP Time Calculation (min) (ACUTE ONLY): 20 min  Assessment / Plan / Recommendation Clinical Impression  Continued PO trials. Pt alert, though restless, non-communicative and unable to follow simple commands. Pt required moderate cueing to maintain upright positioning, attempting to lean on side. Pt with lower dentition, attempted to utilize upper dentures, though poorly fitting without denture adhesive. Pt accepted POs with full feeding assistance (unable to utilize straw due to cognitive deficits). Pt consumed cup sips of thin liquids, puree via teaspoon, and small piece of mechanical soft texture. Pt with disorganized oral manipulation of bolus though exhibited best clearance of puree textures. Delayed cough exhibited x1. Recommend dysphagia 1 (puree) and thin liquids with meds crushed in puree. Full supervision with all POs, hold PO if mentation is poor. Pts daughter reinforced on safe swallow strategies, appropriate and safe to fed mother. SLP to follow up.    HPI HPI: Pt is a 70 y/o female with systolic and diastolic heart failure, diabetes, A. fib, hypertension, dementia, seizure disorder.  She was admitted to [facility] 2/20 with E faecalis left lower lobe pneumonia and septic shock requiring intubation 2/20-2/25 but decompensated and required reintubation 2/27-3/4. She had left lower lobe and partial left upper lobe collapse due to mucous plugging, improved after bronchoscopy 2/27. MRI and EEG were negative. Multidisciplinary goals of care discussion 2/27 and follow up on 3/4: full scope of care including reintubate and trach if needed. CXR 3/11: Suspect underlying congestive heart failure, stable. Areas of  ill-defined airspace opacity in the right upper lobe and left lower  lung regions raise concern for multifocal pneumonia superimposed  on  apparent congestive heart failure.      SLP Plan  Continue with current plan of care       Recommendations  Diet recommendations: Dysphagia 1 (puree);Thin liquid Liquids provided via: Cup Medication Administration: Crushed with puree Supervision: Full supervision/cueing for compensatory strategies;Trained caregiver to feed patient Compensations: Minimize environmental distractions;Small sips/bites;Slow rate;Monitor for anterior loss;Follow solids with liquid Postural Changes and/or Swallow Maneuvers: Seated upright 90 degrees;Upright 30-60 min after meal                Oral Care Recommendations: Oral care BID;Oral care before and after PO Follow up Recommendations: Home health SLP;Skilled Nursing facility;24 hour supervision/assistance SLP Visit Diagnosis: Dysphagia, oral phase (R13.11);Dysphagia, unspecified (R13.10) Plan: Continue with current plan of care       GO                Ardyth Gal MA, CCC-SLP Acute Rehabilitation Services   09/15/2020, 3:07 PM

## 2020-09-16 DIAGNOSIS — I639 Cerebral infarction, unspecified: Secondary | ICD-10-CM | POA: Diagnosis not present

## 2020-09-16 DIAGNOSIS — J181 Lobar pneumonia, unspecified organism: Secondary | ICD-10-CM | POA: Diagnosis not present

## 2020-09-16 DIAGNOSIS — J9601 Acute respiratory failure with hypoxia: Secondary | ICD-10-CM | POA: Diagnosis not present

## 2020-09-16 DIAGNOSIS — G9341 Metabolic encephalopathy: Secondary | ICD-10-CM | POA: Diagnosis not present

## 2020-09-16 LAB — GLUCOSE, CAPILLARY
Glucose-Capillary: 142 mg/dL — ABNORMAL HIGH (ref 70–99)
Glucose-Capillary: 144 mg/dL — ABNORMAL HIGH (ref 70–99)
Glucose-Capillary: 173 mg/dL — ABNORMAL HIGH (ref 70–99)
Glucose-Capillary: 178 mg/dL — ABNORMAL HIGH (ref 70–99)
Glucose-Capillary: 181 mg/dL — ABNORMAL HIGH (ref 70–99)
Glucose-Capillary: 187 mg/dL — ABNORMAL HIGH (ref 70–99)
Glucose-Capillary: 188 mg/dL — ABNORMAL HIGH (ref 70–99)

## 2020-09-16 LAB — VITAMIN B1: Vitamin B1 (Thiamine): 131 nmol/L (ref 66.5–200.0)

## 2020-09-16 MED ORDER — OSMOLITE 1.2 CAL PO LIQD
1000.0000 mL | ORAL | Status: DC
  Administered 2020-09-16 – 2020-09-17 (×2): 1000 mL
  Filled 2020-09-16 (×4): qty 1000

## 2020-09-16 MED ORDER — PROSOURCE TF PO LIQD
90.0000 mL | Freq: Two times a day (BID) | ORAL | Status: DC
  Administered 2020-09-16 – 2020-09-18 (×4): 90 mL
  Filled 2020-09-16 (×5): qty 90

## 2020-09-16 MED ORDER — ENSURE ENLIVE PO LIQD
237.0000 mL | Freq: Three times a day (TID) | ORAL | Status: DC
  Administered 2020-09-16 – 2020-09-17 (×3): 237 mL via ORAL
  Administered 2020-09-18: 120 mL via ORAL
  Administered 2020-09-18 – 2020-09-22 (×11): 237 mL via ORAL
  Filled 2020-09-16 (×2): qty 237

## 2020-09-16 NOTE — Progress Notes (Signed)
Occupational Therapy Treatment Patient Details Name: [name] MRN: [id number] DOB: Mar 07, 1951 Today's Date: 09/16/2020    History of present illness 70 year old female admitted to [facility] 2/20 in the setting of septic shock from pneumonia requiring intubation, (extubated 2/25). MRI Brain 2/25 Acute/subacute 2 mm right centrum semiovale lacunar insult Transferred to [facility] 2/27 with AMS, respiratory failure due to a left lung mucous plug, Intubated 2/27-3/4. 3/8 MRI brain no changes from 2/25  PMhx: HTN, HLD, pulmonary HTN, CHF, AFib, DM, hypothyroidism, OSA   OT comments  Pt progressing with use of RUE today and eyes open for session. Pt with increased alertness today and following 25% of commands on R side. Pt using R side to turn to L side. Pt totalA for bed mobility; tolerating chair position slightly on back, but mostly on L side. Pt performing HEP with cues from daughter to participate. Pt's O2 >90% today. Pt requiring totalA for ADL and continues to require multimodal cues. Goals updated and downgraded. OT following acutely.   Follow Up Recommendations  SNF;Supervision/Assistance - 24 hour (daughter refusing SNF; HH services would be beneficial)    Equipment Recommendations  Hospital bed;Other (comment);Wheelchair (measurements OT);Wheelchair cushion (measurements OT) (hoyer lift)    Recommendations for Other Services      Precautions / Restrictions Precautions Precautions: Fall;Other (comment) Precaution Comments: cortrak Restrictions Weight Bearing Restrictions: No       Mobility Bed Mobility Overal bed mobility: Needs Assistance   Rolling: Mod assist         General bed mobility comments: totalA +2 to scoot to HOB;modA from side lying to back    Transfers                      Balance                                           ADL either performed or assessed with clinical judgement   ADL Overall ADL's : Needs assistance/impaired                                      Functional mobility during ADLs: Total assistance;+2 for physical assistance;+2 for safety/equipment;Cueing for safety General ADL Comments: Pt with increased alertness today and following 25% of commands on R side. Pt using R side to turn to L side. Pt totalA for bed mobility; tolerating chair position slightly on back, but mostly on L side. Pt performing HEP with cues from daughter to participate     Vision       Perception     Praxis      Cognition Arousal/Alertness: Awake/alert Behavior During Therapy: Flat affect Overall Cognitive Status: Difficult to assess Area of Impairment: Attention;Following commands;Problem solving                   Current Attention Level: Focused   Following Commands: Follows one step commands inconsistently;Follows one step commands with increased time     Problem Solving: Slow processing;Decreased initiation;Difficulty sequencing;Requires verbal cues;Requires tactile cues General Comments: Pt awake but following ~25% of commands on R side, but not vocalizing at all.        Exercises Exercises: Other exercises Other Exercises Other Exercises: seated PROM in chair position shoulder flex, hip abdct/adduction, ankle DF; Some AROM noted in  RUE when asked LUE to perform; elbow flex/ext and grip strength Other Exercises: cervical rotation   Shoulder Instructions       General Comments VSS. Daughter in room    Pertinent Vitals/ Pain       Pain Assessment: Faces Faces Pain Scale: No hurt Pain Intervention(s): Monitored during session  Home Living                                          Prior Functioning/Environment              Frequency  Min 2X/week        Progress Toward Goals  OT Goals(current goals can now be found in the care plan section)  Progress towards OT goals: Progressing toward goals  Acute Rehab OT Goals Patient Stated Goal: for pt to regain  strength (per daughter) OT Goal Formulation: With family Time For Goal Achievement: 09/30/20 Potential to Achieve Goals: Good ADL Goals Pt Will Perform Grooming: with max assist;bed level Pt Will Perform Upper Body Bathing: with max assist;bed level Pt/caregiver will Perform Home Exercise Program: Increased ROM;Increased strength;Right Upper extremity;Left upper extremity;With written HEP provided;With Supervision Additional ADL Goal #1: Pt will actively participate in 15 mins therapeutic activity with min cues and no more than 2 rest breaks Additional ADL Goal #2: Pt will follow one step simple commands consistently during familiar ADL activities  Plan Discharge plan remains appropriate    Co-evaluation                 AM-PAC OT "6 Clicks" Daily Activity     Outcome Measure   Help from another person eating meals?: Total Help from another person taking care of personal grooming?: Total Help from another person toileting, which includes using toliet, bedpan, or urinal?: Total Help from another person bathing (including washing, rinsing, drying)?: Total Help from another person to put on and taking off regular upper body clothing?: Total Help from another person to put on and taking off regular lower body clothing?: Total 6 Click Score: 6    End of Session Equipment Utilized During Treatment: Oxygen  OT Visit Diagnosis: Unsteadiness on feet (R26.81);Cognitive communication deficit (R41.841) Symptoms and signs involving cognitive functions: Other cerebrovascular disease   Activity Tolerance Patient limited by fatigue;Patient limited by lethargy   Patient Left in bed;with call bell/phone within reach;with family/visitor present   Nurse Communication Mobility status;Need for lift equipment        Time: 9563-8756 OT Time Calculation (min): 27 min  Charges: OT General Charges $OT Visit: 1 Visit OT Treatments $Therapeutic Activity: 8-22 mins $Therapeutic Exercise: 8-22  mins  Flora Lipps, OTR/L Acute Rehabilitation Services Pager: [phone] Office: [phone]   Neta Upadhyay C 09/16/2020, 6:11 PM

## 2020-09-16 NOTE — Progress Notes (Signed)
Nutrition Follow-up  DOCUMENTATION CODES:   Obesity unspecified  INTERVENTION:   Tube feeding via Cortrak: Change to nocturnal feeding regimen: Osmolite 1.2 at 70 ml/h from 6pm-8am (14 hours total, 980 ml total) Prosource TF 90 ml BID  Provides 1336 kcal (75% of estimated needs), 98 gm protein (89% of estimated needs), 804 ml free water daily.   Add Ensure Enlive po TID, each supplement provides 350 kcal and 20 grams of protein   NUTRITION DIAGNOSIS:   Inadequate oral intake related to dysphagia as evidenced by meal completion < 50%.  Ongoing.   GOAL:   Patient will meet greater than or equal to 90% of their needs  Progressing   MONITOR:   PO intake,Supplement acceptance,Labs,TF tolerance  REASON FOR ASSESSMENT:   Consult Enteral/tube feeding initiation and management  ASSESSMENT:   Patient presents with septic shock, pneumonia, acute respiratory failure, acute kidney injury on CKD-3a, DM-2 and obesity. Hx of diastolic heart failure, chronic atrial fibrillation.  SLP following for dysphagia. Diet advanced to dysphagia 1 with thin liquids 3/15. She ate 10% of lunch 3/15. Diet advanced to dysphagia 2 with thin liquids today.   Cortrak in place. Receiving Osmolite 1.2 at 60 ml/h with Prosource TF 45 ml BID to provide 1808 kcal, 102 gm protein, 1181 ml free water daily. TF is meeting 100% of estimated kcal and protein needs.   Labs reviewed. CBG: [phone]  Medications reviewed and include Lasix, Novolog, Lantus, lactulose.   Diet Order:   Diet Order            DIET DYS 2 Room service appropriate? No; Fluid consistency: Thin  Diet effective now                 EDUCATION NEEDS:   Not appropriate for education at this time  Skin: Reviewed RN Assessmnet  Last BM:  3/16 type 7  Height:   Ht Readings from Last 1 Encounters:  08/30/20 5\' 6"  (1.676 m)    Weight:   Wt Readings from Last 1 Encounters:  09/16/20 99.6 kg    Ideal Body Weight:  59  kg  BMI:  Body mass index is 35.44 kg/m.  Estimated Nutritional Needs:   Kcal:  1700-1900  Protein:  110-130 gm  Fluid:  >/=1.7 L   09/18/20, RD, LDN, CNSC Please refer to Amion for contact information.

## 2020-09-16 NOTE — Progress Notes (Signed)
  Speech Language Pathology Treatment: Dysphagia  Patient Details Name: [name] MRN: [id number] DOB: 05-Feb-1951 Today's Date: 09/16/2020 Time: 7035-0093 SLP Time Calculation (min) (ACUTE ONLY): 14 min  Assessment / Plan / Recommendation Clinical Impression  Pt was seen for dysphagia treatment with her daughter present. [name] reported that the pt consumed yesterday's dysphagia 1 diet and thin liquids without overt s/sx of aspiration. However, she stated that has not fed the pt breakfast since she "knows she won't eat it". Pt's maxillary dentures were not in place during the session due to them being ill fitting. No s/sx of aspiration were noted with dysphagia 1, dysphagia 2, regular texture solids, or with thin liquids via straw. Pt initially required cueing for lingual positioning to allow use of spoon, but this was quickly faded. Mastication was prolonged with regular texture solids, but functional for dysphagia 2 solids. The impact of her dental status on this is considered. Oral clearance was adequate with intermittent use of a liquid wash. A dysphagia 2 diet with thin liquids is recommended at this time. SLP will continue to follow pt.    HPI HPI: Pt is a 70 y/o female with systolic and diastolic heart failure, diabetes, A. fib, hypertension, dementia, seizure disorder.  She was admitted to [facility] 2/20 with E faecalis left lower lobe pneumonia and septic shock requiring intubation 2/20-2/25 but decompensated and required reintubation 2/27-3/4. She had left lower lobe and partial left upper lobe collapse due to mucous plugging, improved after bronchoscopy 2/27. MRI and EEG were negative. Multidisciplinary goals of care discussion 2/27 and follow up on 3/4: full scope of care including reintubate and trach if needed. CXR 3/11: Suspect underlying congestive heart failure, stable. Areas of  ill-defined airspace opacity in the right upper lobe and left lower  lung regions raise concern for  multifocal pneumonia superimposed on  apparent congestive heart failure.      SLP Plan  Continue with current plan of care       Recommendations  Diet recommendations: Thin liquid;Dysphagia 2 (fine chop) Liquids provided via: Cup;Straw Medication Administration: Crushed with puree Supervision: Full supervision/cueing for compensatory strategies;Trained caregiver to feed patient Compensations: Minimize environmental distractions;Small sips/bites;Slow rate;Follow solids with liquid Postural Changes and/or Swallow Maneuvers: Seated upright 90 degrees;Upright 30-60 min after meal                Oral Care Recommendations: Oral care BID;Oral care before and after PO Follow up Recommendations: Home health SLP;Skilled Nursing facility;24 hour supervision/assistance SLP Visit Diagnosis: Dysphagia, oral phase (R13.11);Dysphagia, unspecified (R13.10) Plan: Continue with current plan of care       Ausha Sieh I. Vear Clock, MS, CCC-SLP Acute Rehabilitation Services Office number [phone] Pager [phone]                [name] 09/16/2020, 9:56 AM

## 2020-09-16 NOTE — TOC Initial Note (Signed)
Transition of Care [facility]) - Initial/Assessment Note    Patient Details  Name: [name] MRN: [id number] Date of Birth: September 01, 1950  Transition of Care [facility]) CM/SW Contact:    Curlene Labrum, RN Phone Number: 09/16/2020, 8:14 AM  Clinical Narrative:                 Case management met with the patient and daughter at the bedside regarding transitions of care needs.  The daughter is requesting that patient return home with her home at [address] [place], [place] [address] when she is medically ready to discharge to home.  [name] is unable to provide home health services at this time - so a home health agency will need to be found closer to discharge to home.  The patient currently has dme at home that includes - home O2 thru [employer], wheelchair, 3:1 and RW - but will need hospital bed, hoyer lift and possible nutritional supplements - considering patient currently has Cortrak and is being followed by speech for swallowing abilities.  CM and MSW will continue to follow the patient for transitions of care needs.   Expected Discharge Plan: [place] Barriers to Discharge: Continued Medical Work up   Patient Goals and CMS Choice Patient states their goals for this hospitalization and ongoing recovery are:: The patient's daughter requests patient to discharge to home with supportive home health services. CMS Medicare.gov Compare Post Acute Care list provided to:: Patient Represenative (must comment) (Patient's daughter, [name]) Choice offered to / list presented to : Adult Children  Expected Discharge Plan and Services Expected Discharge Plan: [place] In-house Referral: Clinical Social Work Discharge Planning Services: CM Consult Post Acute Care Choice: [place] arrangements for the past 2 months: [place]: [place]        Prior Living  Arrangements/Services Living arrangements for the past 2 months: Single Family Home Lives with:: [name] Patient language and need for interpreter reviewed:: Yes Do you feel safe going back to the place where you live?: Yes      Need for Family Participation in Patient Care: Yes (Comment) Care giver support system in place?: Yes (comment) Current home services: DME (Patient currently has standard wheelchair, rolling walker, 3:1, Home O2 thru [employer], [employer] oxygen concentrator) Criminal Activity/Legal Involvement Pertinent to Current Situation/Hospitalization: No - Comment as needed  Activities of Daily Living Home Assistive Devices/Equipment: None ADL Screening (condition at time of admission) Patient's cognitive ability adequate to safely complete daily activities?: No Is the patient deaf or have difficulty hearing?: No Does the patient have difficulty seeing, even when wearing glasses/contacts?: No Does the patient have difficulty concentrating, remembering, or making decisions?: Yes Patient able to express need for assistance with ADLs?: No Does the patient have difficulty dressing or bathing?: Yes Independently performs ADLs?: No Communication: Dependent Is this a change from baseline?: Pre-admission baseline Dressing (OT): Dependent Is this a change from baseline?: Pre-admission baseline Grooming: Dependent Is this a change from baseline?: Pre-admission baseline Feeding: Dependent Is this a change from baseline?: Pre-admission baseline Bathing: Dependent Is this a change from baseline?: Pre-admission baseline Toileting: Dependent Is this a change from baseline?: Pre-admission baseline In/Out Bed: Dependent Walks in Home: Needs assistance Is this a change from baseline?: Pre-admission  baseline Does the patient have difficulty walking or climbing stairs?: Yes Weakness of Legs: Both Weakness of Arms/Hands: Both  Permission Sought/Granted Permission sought  to share information with : Case Manager,Family Supports Permission granted to share information with : Yes, Verbal Permission Granted  Share Information with NAME: [name]  Permission granted to share info w AGENCY: Home Health agencies  Permission granted to share info w Relationship: daughter, [name]  Permission granted to share info w Contact Information: [phone]  Emotional Assessment Appearance:: Appears stated age Attitude/Demeanor/Rapport: Unable to Assess Affect (typically observed): Quiet Orientation: :  (patient tracks with eyes and smiled - unable to determine orientation at this time -) Alcohol / Substance Use: Not Applicable Psych Involvement: No (comment)  Admission diagnosis:  Unresponsive [R41.89] Respiratory failure with hypoxia ([place]) [J96.91] Acute respiratory failure with hypoxia ([place]) [J96.01] Patient Active Problem List   Diagnosis Date Noted  . Acute metabolic encephalopathy 93/57/0177  . Acute ischemic stroke ([place])   . Respiratory failure with hypoxia ([place]) 08/24/2020  . Lobar pneumonia ([place]) 08/24/2020  . Septic shock ([place]) 08/24/2020  . NSTEMI (non-ST elevated myocardial infarction) ([place]) 08/17/2020  . Constipation 03/17/2020  . Knee pain 10/07/2019  . Fibromyalgia 10/07/2019  . Polyarthropathy 10/07/2019  . Fibromyositis 09/30/2019  . Abnormal gait due to muscle weakness 09/30/2019  . Pain in right knee 09/30/2019  . Depression 08/31/2018  . Anxiety 08/31/2018  . Chronic respiratory failure with hypoxia and hypercapnia ([place]) 12/13/2017  . Acute systolic HF (heart failure) ([place]) 11/26/2017  . Palliative care by specialist   . Goals of care, counseling/discussion   . Acute on chronic diastolic CHF (congestive heart failure) ([place]) 08/17/2017  . Acute respiratory failure with hypoxia and hypercapnia ([place]) 08/16/2017  . Uncontrolled type 2 diabetes mellitus with hyperglycemia ([place]) 08/16/2017  . Acute respiratory failure ([place])   . CAP (community  acquired pneumonia) 08/05/2017  . COPD exacerbation ([place]) 08/05/2017  . Pulmonary hypertension ([place]) 04/05/2017  . Derangement of posterior horn of medial meniscus of right knee   . Lateral meniscus, anterior horn derangement, right   . Syncope, near 12/26/2015  . Fall at home 12/26/2015  . Dysphagia 12/07/2015  . IDA (iron deficiency anemia) 03/13/2015  . Chronic diastolic CHF (congestive heart failure) ([place]) 02/20/2015  . Diffuse abdominal pain 09/16/2014  . Ascites 08/27/2014  . Anemia 07/20/2014  . Epistaxis, recurrent 07/20/2014  . Seizure ([place]) 04/11/2014  . Dementia ([place]) 04/11/2014  . Chronic diarrhea 04/11/2014  . DM type 2 (diabetes mellitus, type 2) ([place]) 04/11/2014  . Protein-calorie malnutrition, severe ([place]) 04/11/2014  . Abnormal weight loss 08/26/2013  . Lower abdominal pain 08/26/2013  . Anorexia nervosa 08/26/2013  . Encounter for therapeutic drug monitoring 08/26/2013  . DOE (dyspnea on exertion) 06/29/2013  . Tremor 10/27/2012  . Chronic anticoagulation 06/23/2011  . HYPOTENSION, ORTHOSTATIC 08/26/2009  . Hyperlipidemia 03/04/2009  . Barrett's esophagus 09/26/2008  . Gastroparesis 09/26/2008  . Iron deficiency anemia 09/25/2008  . Essential hypertension 09/25/2008  . LUNG NODULE 09/25/2008  . Irritable bowel syndrome 09/25/2008  . Sleep apnea 09/25/2008  . CARPAL TUNNEL SYNDROME, HX OF 09/25/2008  . Hypothyroidism 04/10/2008  . Body mass index (BMI) 40.0-44.9, adult ([place]) 04/10/2008  . Atrial fibrillation ([place]) 04/10/2008  . Congestive heart failure ([place]) 04/10/2008  . Low back pain 04/10/2008  . CHEST PAIN-PRECORDIAL 04/10/2008  . Obesity 04/10/2008   PCP:  Lemmie Evens, MD Pharmacy:   [place], [place] [address]  [place] [address] Phone: [phone] Fax: [phone]     Social Determinants of Health (SDOH) Interventions    Readmission Risk Interventions Readmission Risk Prevention Plan  09/16/2020  Transportation Screening Complete  Medication Review Press photographer) Complete  PCP or Specialist appointment within 3-5 days of discharge Complete  HRI or Home Care Consult Complete  SW Recovery Care/Counseling Consult Complete  Palliative Care Screening Complete  Skilled Nursing Facility Complete  Some recent data might be hidden

## 2020-09-16 NOTE — Progress Notes (Addendum)
PROGRESS NOTE        PATIENT DETAILS Name: [name] Age: 70 y.o. Sex: female Date of Birth: [birth year] Admit Date: 08/23/2020 Admitting Physician Bennie Pierini, MD [id number], [name], MD  Brief Narrative: Patient is a 70 y.o. female with past medical history of chronic hypoxic respiratory failure on 2-3 L of oxygen at home, chronic diastolic heart failure, DM-2, A. fib, HTN, seizure disorder-who was admitted to [facility] on 2/20 with a faecalis left lower lobe pneumonia/septic shock requiring mechanical ventilation-she was extubated on 2/25-but subsequently decompensated and was reintubated on 2/27.  She had left lower lobe/partial left upper lobe collapse due to mucous plugging-and improved after bronchoscopy on 2/27.  She was subsequently extubated on 3/4-however further hospital course was complicated by somnolence/encephalopathy and dysphagia requiring NG tube feeding.  See below for further details.    Significant events: 2/20 Admitted to [facility], intubated 2/22 Stop amiodarone (esr 95) 2/23 Solumedrol trial @ 80 q 12 and reduced to 40 mg q 12h Pm 2/25 2/25 Extubated 2/26 Worsening mental status/respiratory failure 2/27 Transferred to [facility], reintubated, bronched 2/28 Decreasing vent settings 3/01 Failed vent wean 2/2 increased HR/RR 3/4 Extubated , bipap q hs  Significant studies: 2/21>> CT head: No acute abnormalities 2/21>> CT chest: Left upper lobe/bilateral lower lobe infiltrates 2/21>> CT abdomen/pelvis: Sigmoid diverticulosis, fat-containing umbilical hernia 7/12>> CT C-spine: No acute abnormalities 2/21>> A1c: 7.9 2/25>> MRI brain: Acute/subacute 2 mm right centrum infarct 2/26>> Echo: EF 50-55% 2/26>> carotid Doppler: No significant stenosis on the left, right carotid system not evaluated due to overlying bandages. 2/26>> LDL: 88 3/6>> CT head: No acute intracranial abnormality. 3/7>> EEG: No seizures-diffuse encephalopathy 3/8>> MRI brain:  No acute infarct/hemorrhage/mass  Antimicrobial therapy: See Below  Microbiology data: 2/22>> blood culture 1/2>> staph epidermidis 2/22>> tracheal aspirate: Enterococcus faecalis 2/27>> urine culture: 80,000 colonies/mL yeast 2/27>> tracheal aspirate: Rare Candida Dubliniensis  Procedures : ETT 2/20 >>2/25, 2/27 >>3/4 R IJ CVL 2/21 >>  Consults: PCCM, neurology  DVT Prophylaxis : SCDs Start: 08/24/20 0945 apixaban (ELIQUIS) tablet 5 mg    Subjective: No major issues overnight-lying comfortably in bed.  Daughter at bedside.  NG tube remains in place.   Assessment/Plan: Acute on chronic hypoxic respiratory failure due to left mainstem mucous plugging/HCAP: Continue pulmonary toileting-mobilize as much as possible-has completed a course of antimicrobial therapy.  Culture data as above.  Acute metabolic encephalopathy: Multifactorial etiology-due to ICU delirium/underlying dementia/pneumonia.  Continue supportive care-minimize sedatives/narcotics as much as possible.  Work-up as above-neurology has signed off.  Acute CVA: Seen on MRI on 2/25-incidental-likely clinically insignificant.  Work-up as above-already on full dose anticoagulation.  Dysphagia: On NG tube feedings-encourage oral intake-and see if tube feedings can be weaned off over the next few days.  SLP following-on dysphagia 2 diet.  Normocytic anemia: Due to acute illness-no evidence of blood loss.  Transfuse if hemoglobin <7  Dementia: Following simple commands-continue Aricept  Seizure disorder: On Keppra-EEG/MRI brain as above.  Chronic atrial fibrillation: Continue amiodarone-anticoagulated with Eliquis  Chronic diastolic heart failure: Euvolemic on exam-continue Lasix  HTN: Controlled-continue metoprolol-follow and adjust.  HLD: On statin  Hypothyroidism: Continue Synthroid  IDDM-2: CBG stable-continue Lantus 40 units twice daily, 4 units of NovoLog every 4 hours-and SSI.   Recent Labs     09/16/20 0350 09/16/20 0834 09/16/20 1223  GLUCAP 142* 178* 181*  Severe debility/deconditioning/functional quadriplegia: Continue PT/OT eval.  Obesity: Estimated body mass index is 35.44 kg/m as calculated from the following:   Height as of this encounter: $RemoveBeforeD'5\' 6"'[id number]$  (1.676 m).   Weight as of this encounter: 99.6 kg.     Diet: Diet Order            DIET DYS 2 Room service appropriate? No; Fluid consistency: Thin  Diet effective now                  Code Status: Full code   Family Communication: Daughter at bedside  Disposition Plan: Status is: Inpatient  Remains inpatient appropriate because:Inpatient level of care appropriate due to severity of illness   Dispo: The patient is from: Home              Anticipated d/c is to: Home              Patient currently is not medically stable to d/c.   Difficult to place patient No     Barriers to Discharge: Dysphagia- still requiring NG tube feedings.  Antimicrobial agents: Anti-infectives (From admission, onward)   Start     Dose/Rate Route Frequency Ordered Stop   08/28/20 1900  vancomycin (VANCOREADY) IVPB 1250 mg/250 mL  Status:  Discontinued        1,250 mg 166.7 mL/hr over 90 Minutes Intravenous Every 24 hours 08/27/20 1820 08/28/20 1204   08/27/20 2200  piperacillin-tazobactam (ZOSYN) IVPB 3.375 g        3.375 g 12.5 mL/hr over 240 Minutes Intravenous Every 8 hours 08/27/20 1412 09/03/20 0141   08/27/20 1915  vancomycin (VANCOREADY) IVPB 2000 mg/400 mL        2,000 mg 200 mL/hr over 120 Minutes Intravenous  Once 08/27/20 1816 08/27/20 2156   08/27/20 1430  piperacillin-tazobactam (ZOSYN) IVPB 3.375 g        3.375 g 100 mL/hr over 30 Minutes Intravenous  Once 08/27/20 1412 08/27/20 1502   08/27/20 1000  ceFEPIme (MAXIPIME) 2 g in sodium chloride 0.9 % 100 mL IVPB  Status:  Discontinued        2 g 200 mL/hr over 30 Minutes Intravenous Every 12 hours 08/27/20 0744 08/27/20 1412   08/26/20 1000  ceFEPIme  (MAXIPIME) 2 g in sodium chloride 0.9 % 100 mL IVPB  Status:  Discontinued        2 g 200 mL/hr over 30 Minutes Intravenous Every 8 hours 08/26/20 0848 08/27/20 0744   08/25/20 1200  vancomycin (VANCOCIN) IVPB 1000 mg/200 mL premix  Status:  Discontinued        1,000 mg 200 mL/hr over 60 Minutes Intravenous Every 24 hours 08/24/20 1043 08/24/20 1452   08/24/20 1200  vancomycin (VANCOREADY) IVPB 2000 mg/400 mL  Status:  Discontinued        2,000 mg 200 mL/hr over 120 Minutes Intravenous  Once 08/24/20 1009 08/24/20 1452   08/24/20 1045  ceFEPIme (MAXIPIME) 2 g in sodium chloride 0.9 % 100 mL IVPB  Status:  Discontinued        2 g 200 mL/hr over 30 Minutes Intravenous Every 12 hours 08/24/20 0958 08/26/20 0848   08/24/20 0100  ceFEPIme (MAXIPIME) 2 g in sodium chloride 0.9 % 100 mL IVPB        2 g 200 mL/hr over 30 Minutes Intravenous  Once 08/24/20 0047 08/24/20 0219       Time spent: 25 minutes-Greater than 50% of this time was spent in counseling, explanation  of diagnosis, planning of further management, and coordination of care.  MEDICATIONS: Scheduled Meds: . amiodarone  200 mg Per Tube Daily  . apixaban  5 mg Per Tube BID  . aspirin  81 mg Per Tube Daily  . atorvastatin  80 mg Per Tube Daily  . chlorhexidine  15 mL Mouth Rinse BID  . Chlorhexidine Gluconate Cloth  6 each Topical Daily  . donepezil  5 mg Per Tube QHS  . DULoxetine  30 mg Oral QHS  . feeding supplement  237 mL Oral TID BM  . feeding supplement (PROSource TF)  90 mL Per Tube BID  . furosemide  40 mg Oral Daily  . guaiFENesin  5 mL Per Tube Q6H  . insulin aspart  0-20 Units Subcutaneous Q4H  . insulin aspart  4 Units Subcutaneous Q4H  . insulin glargine  40 Units Subcutaneous BID  . lactulose  10 g Per Tube BID  . levETIRAcetam  1,000 mg Per Tube BID  . levothyroxine  175 mcg Per Tube Q0600  . mouth rinse  15 mL Mouth Rinse q12n4p  . metoprolol tartrate  12.5 mg Per Tube BID   Continuous Infusions: .  sodium chloride Stopped (09/06/20 1228)  . feeding supplement (OSMOLITE 1.2 CAL)     PRN Meds:.sodium chloride, acetaminophen **OR** acetaminophen, hydrALAZINE, ipratropium-albuterol, labetalol, loperamide HCl, melatonin, naLOXone (NARCAN)  injection, ondansetron **OR** ondansetron (ZOFRAN) IV   PHYSICAL EXAM: Vital signs: Vitals:   09/16/20 0350 09/16/20 0500 09/16/20 0832 09/16/20 1218  BP: 111/84  (!) 102/53 (!) 102/49  Pulse: 90  98 81  Resp: _0 Temp: 98.3 F (36.8 C)  97.8 F (36.6 C) 98 F (36.7 C)  TempSrc: Oral  Axillary Axillary  SpO2: 100%  98% 100%  Weight:  99.6 kg    Height:       Filed Weights   09/13/20 0105 09/14/20 0400 09/16/20 0500  Weight: 98.4 kg 100.5 kg 99.6 kg   Body mass index is 35.44 kg/m.   Gen Exam: Following commands-appears weak and debilitated. HEENT:atraumatic, normocephalic Chest: B/L clear to auscultation anteriorly CVS:S1S2 regular Abdomen:soft non tender, non distended Extremities:no edema Neurology: Non focal-but with generalized weakness. Skin: no rash  I have personally reviewed following labs and imaging studies  LABORATORY DATA: CBC: Recent Labs  Lab 09/10/20 0139 09/11/20 0203 09/12/20 0107 09/13/20 0329 09/14/20 0124  WBC 7.2 6.9 7.6 8.9  --   HGB 8.9* 9.1* 8.7* 9.5* 9.4*  HCT 30.4* 28.8* 29.1* 31.2* 30.9*  MCV 110.1* 106.3* 109.4* 107.6*  --   PLT 202 206 191 204  --     Basic Metabolic Panel: Recent Labs  Lab 09/10/20 0139 09/11/20 0203 09/12/20 0107 09/14/20 0124  NA 142 139 140 140  K 3.8 4.0 4.3 4.6  CL 109 105 104 98  CO2 _1 36*  GLUCOSE 198* 194* 226* 185*  BUN 26* 26* 26* 24*  CREATININE 0.89 0.88 0.91 0.94  CALCIUM 9.1 9.3 9.1 9.1  MG 2.1  --  2.0  --   PHOS 4.3  --   --   --     GFR: Estimated Creatinine Clearance: 65.3 mL/min (by C-G formula based on SCr of 0.94 mg/dL).  Liver Function Tests: Recent Labs  Lab 09/12/20 0107  AST 21  ALT 21  ALKPHOS 42  BILITOT 0.6   PROT 5.5*  ALBUMIN 2.7*   No results for input(s): LIPASE, AMYLASE in the last 168 hours. Recent Labs  Lab 09/10/20 0934 09/12/20 0107  AMMONIA 38* 15    Coagulation Profile: No results for input(s): INR, PROTIME in the last 168 hours.  Cardiac Enzymes: No results for input(s): CKTOTAL, CKMB, CKMBINDEX, TROPONINI in the last 168 hours.  BNP (last 3 results) No results for input(s): PROBNP in the last 8760 hours.  Lipid Profile: No results for input(s): CHOL, HDL, LDLCALC, TRIG, CHOLHDL, LDLDIRECT in the last 72 hours.  Thyroid Function Tests: No results for input(s): TSH, T4TOTAL, FREET4, T3FREE, THYROIDAB in the last 72 hours.  Anemia Panel: No results for input(s): VITAMINB12, FOLATE, FERRITIN, TIBC, IRON, RETICCTPCT in the last 72 hours.  Urine analysis:    Component Value Date/Time   COLORURINE YELLOW 09/08/2020 1006   APPEARANCEUR HAZY (A) 09/08/2020 1006   LABSPEC 1.023 09/08/2020 1006   PHURINE 5.0 09/08/2020 1006   GLUCOSEU NEGATIVE 09/08/2020 1006   HGBUR NEGATIVE 09/08/2020 1006   BILIRUBINUR NEGATIVE 09/08/2020 1006   KETONESUR NEGATIVE 09/08/2020 1006   PROTEINUR 30 (A) 09/08/2020 1006   UROBILINOGEN 0.2 02/05/2015 1700   NITRITE NEGATIVE 09/08/2020 1006   LEUKOCYTESUR NEGATIVE 09/08/2020 1006    Sepsis Labs: Lactic Acid, Venous    Component Value Date/Time   LATICACIDVEN 1.2 08/23/2020 2347    MICROBIOLOGY: No results found for this or any previous visit (from the past 240 hour(s)).  RADIOLOGY STUDIES/RESULTS: No results found.   LOS: 23 days   Oren Binet, MD  [facility]    To contact the attending Akeira Lahm between 7A-7P or the covering Yarrow Linhart during after hours 7P-7A, please log into the web site [email or web address] and access using universal [place] password for that web site. If you do not have the password, please call the hospital operator.  09/16/2020, 2:50 PM

## 2020-09-17 LAB — BASIC METABOLIC PANEL
Anion gap: 8 (ref 5–15)
BUN: 28 mg/dL — ABNORMAL HIGH (ref 8–23)
CO2: 31 mmol/L (ref 22–32)
Calcium: 9 mg/dL (ref 8.9–10.3)
Chloride: 98 mmol/L (ref 98–111)
Creatinine, Ser: 1 mg/dL (ref 0.44–1.00)
GFR, Estimated: >60 mL/min (ref >60)
Glucose, Bld: 214 mg/dL — ABNORMAL HIGH (ref 70–99)
Potassium: 4.1 mmol/L (ref 3.5–5.1)
Sodium: 137 mmol/L (ref 135–145)

## 2020-09-17 LAB — CBC
HCT: 31.2 % — ABNORMAL LOW (ref 36.0–46.0)
Hemoglobin: 9.6 g/dL — ABNORMAL LOW (ref 12.0–15.0)
MCH: 32.4 pg (ref 26.0–34.0)
MCHC: 30.8 g/dL (ref 30.0–36.0)
MCV: 105.4 fL — ABNORMAL HIGH (ref 80.0–100.0)
Platelets: 183 10*3/uL (ref 150–400)
RBC: 2.96 MIL/uL — ABNORMAL LOW (ref 3.87–5.11)
RDW: 17.4 % — ABNORMAL HIGH (ref 11.5–15.5)
WBC: 8.6 10*3/uL (ref 4.0–10.5)
nRBC: 0 % (ref 0.0–0.2)

## 2020-09-17 LAB — GLUCOSE, CAPILLARY
Glucose-Capillary: 124 mg/dL — ABNORMAL HIGH (ref 70–99)
Glucose-Capillary: 129 mg/dL — ABNORMAL HIGH (ref 70–99)
Glucose-Capillary: 148 mg/dL — ABNORMAL HIGH (ref 70–99)
Glucose-Capillary: 177 mg/dL — ABNORMAL HIGH (ref 70–99)
Glucose-Capillary: 186 mg/dL — ABNORMAL HIGH (ref 70–99)
Glucose-Capillary: 191 mg/dL — ABNORMAL HIGH (ref 70–99)

## 2020-09-17 MED ORDER — LIDOCAINE 5 % EX PTCH
1.0000 | MEDICATED_PATCH | CUTANEOUS | Status: DC
  Administered 2020-09-17 – 2020-09-22 (×6): 1 via TRANSDERMAL
  Filled 2020-09-17 (×6): qty 1

## 2020-09-17 NOTE — Progress Notes (Signed)
  Speech Language Pathology Treatment: Dysphagia  Patient Details Name: [name] MRN: [id number] DOB: 1950/09/24 Today's Date: 09/17/2020 Time: 1204-1220 SLP Time Calculation (min) (ACUTE ONLY): 16 min  Assessment / Plan / Recommendation Clinical Impression  Pt was seen for dysphagia treatment with her daughter present. [name] and nurse reported that the pt has been tolerating the current diet without overt s/sx of aspiration. [name] stated that the pt's dentures and adhesive were brought to the hospital. Adhesive was applied and placement attempted in preparation for more advanced solid trials. However, pt demonstrated gagging during and after placement and dentures were ultimately removed by the SLP. Pt tolerated thin liquids without symptoms of oropharyngeal dysphagia. Solids were deferred since pt demonstrated fatigue (characterized by reduced interaction and increased frequency of eye closure) after attempts at denture placement and indicated that she was tired. SLP will continue to follow pt.   HPI HPI: Pt is a 70 y/o female with systolic and diastolic heart failure, diabetes, A. fib, hypertension, dementia, seizure disorder.  She was admitted to [facility] 2/20 with E faecalis left lower lobe pneumonia and septic shock requiring intubation 2/20-2/25 but decompensated and required reintubation 2/27-3/4. She had left lower lobe and partial left upper lobe collapse due to mucous plugging, improved after bronchoscopy 2/27. MRI and EEG were negative. Multidisciplinary goals of care discussion 2/27 and follow up on 3/4: full scope of care including reintubate and trach if needed. CXR 3/11: Suspect underlying congestive heart failure, stable. Areas of  ill-defined airspace opacity in the right upper lobe and left lower  lung regions raise concern for multifocal pneumonia superimposed on  apparent congestive heart failure.      SLP Plan  Continue with current plan of care        Recommendations  Diet recommendations: Dysphagia 2 (fine chop);Thin liquid Liquids provided via: Cup;Straw Medication Administration: Crushed with puree Supervision: Full supervision/cueing for compensatory strategies;Trained caregiver to feed patient Compensations: Minimize environmental distractions;Small sips/bites;Slow rate;Follow solids with liquid Postural Changes and/or Swallow Maneuvers: Seated upright 90 degrees;Upright 30-60 min after meal                Oral Care Recommendations: Oral care BID;Oral care before and after PO Follow up Recommendations: Home health SLP;Skilled Nursing facility;24 hour supervision/assistance SLP Visit Diagnosis: Dysphagia, unspecified (R13.10) Plan: Continue with current plan of care       Kenai Fluegel I. Vear Clock, MS, CCC-SLP Acute Rehabilitation Services Office number [phone] Pager [phone]                [name] 09/17/2020, 1:19 PM

## 2020-09-17 NOTE — Progress Notes (Signed)
PROGRESS NOTE        PATIENT DETAILS Name: [name] Age: 70 y.o. Sex: female Date of Birth: May 09, 1951 Admit Date: 08/23/2020 Admitting Physician Bennie Pierini, MD [id number], [name], MD  Brief Narrative: Patient is a 70 y.o. female with past medical history of chronic hypoxic respiratory failure on 2-3 L of oxygen at home, chronic diastolic heart failure, DM-2, A. fib, HTN, seizure disorder-who was admitted to [facility] on 2/20 with a faecalis left lower lobe pneumonia/septic shock requiring mechanical ventilation-she was extubated on 2/25-but subsequently decompensated and was reintubated on 2/27.  She had left lower lobe/partial left upper lobe collapse due to mucous plugging-and improved after bronchoscopy on 2/27.  She was subsequently extubated on 3/4-however further hospital course was complicated by somnolence/encephalopathy and dysphagia requiring NG tube feeding.  See below for further details.    Significant events: 2/20 Admitted to [facility], intubated 2/22 Stop amiodarone (esr 95) 2/23 Solumedrol trial @ 80 q 12 and reduced to 40 mg q 12h Pm 2/25 2/25 Extubated 2/26 Worsening mental status/respiratory failure 2/27 Transferred to [facility], reintubated, bronched 2/28 Decreasing vent settings 3/01 Failed vent wean 2/2 increased HR/RR 3/4 Extubated , bipap q hs  Significant studies: 2/21>> CT head: No acute abnormalities 2/21>> CT chest: Left upper lobe/bilateral lower lobe infiltrates 2/21>> CT abdomen/pelvis: Sigmoid diverticulosis, fat-containing umbilical hernia 9/93>> CT C-spine: No acute abnormalities 2/21>> A1c: 7.9 2/25>> MRI brain: Acute/subacute 2 mm right centrum infarct 2/26>> Echo: EF 50-55% 2/26>> carotid Doppler: No significant stenosis on the left, right carotid system not evaluated due to overlying bandages. 2/26>> LDL: 88 3/6>> CT head: No acute intracranial abnormality. 3/7>> EEG: No seizures-diffuse encephalopathy 3/8>> MRI brain:  No acute infarct/hemorrhage/mass  Antimicrobial therapy: See Below  Microbiology data: 2/22>> blood culture 1/2>> staph epidermidis 2/22>> tracheal aspirate: Enterococcus faecalis 2/27>> urine culture: 80,000 colonies/mL yeast 2/27>> tracheal aspirate: Rare Candida Dubliniensis  Procedures : ETT 2/20 >>2/25, 2/27 >>3/4 R IJ CVL 2/21 >>  Consults: PCCM, neurology  DVT Prophylaxis : SCDs Start: 08/24/20 0945 apixaban (ELIQUIS) tablet 5 mg    Subjective: Lying comfortably in bed-Per nursing staff eating only around 10% of her meals.   Assessment/Plan: Acute on chronic hypoxic respiratory failure due to left mainstem mucous plugging/HCAP: Continue pulmonary toileting-mobilize as much as possible-has completed a course of antimicrobial therapy.  Culture data as above.  Remains stable on 2-3 L of oxygen-which is her baseline.  Acute metabolic encephalopathy: Multifactorial etiology-due to ICU delirium/underlying dementia/pneumonia.  Her mentation has improved-continue to minimize sedatives/narcotics.  Work-up as above-neurology has signed off.    Acute CVA: Seen on MRI on 2/25-incidental-likely clinically insignificant.  Work-up as above-already on full dose anticoagulation.  Dysphagia: Improving-SLP has advance diet to dysphagia 2.  Unfortunately only eating around 10-15% of meals-evaluated by nutrition services on 3/16-has been switched to nocturnal feeding.  Have counseled patient/daughter at bedside-encouraged oral intake as much as possible.  Normocytic anemia: Due to acute illness-no evidence of blood loss.  Transfuse if hemoglobin <7  Dementia: Following simple commands-continue Aricept  Seizure disorder: On Keppra-EEG/MRI brain as above.  Chronic atrial fibrillation: Continue amiodarone-anticoagulated with Eliquis  Chronic diastolic heart failure: Euvolemic on exam-continue Lasix  HTN: Controlled-continue metoprolol-follow and adjust.  HLD: On  statin  Hypothyroidism: Continue Synthroid  IDDM-2: CBG stable-continue Lantus 40 units twice daily, 4 units of NovoLog every 4 hours-and  SSI.   Recent Labs    09/16/20 2349 09/17/20 0343 09/17/20 0803  GLUCAP 187* 186* 148*   Severe debility/deconditioning/functional quadriplegia: Continue PT/OT eval.  Obesity: Estimated body mass index is 35.76 kg/m as calculated from the following:   Height as of this encounter: _0  (1.676 m).   Weight as of this encounter: 100.5 kg.     Diet: Diet Order            DIET DYS 2 Room service appropriate? No; Fluid consistency: Thin  Diet effective now                  Code Status: Full code   Family Communication: Daughter at bedside  Disposition Plan: Status is: Inpatient  Remains inpatient appropriate because:Inpatient level of care appropriate due to severity of illness   Dispo: The patient is from: Home              Anticipated d/c is to: Home              Patient currently is not medically stable to d/c.   Difficult to place patient No     Barriers to Discharge: Dysphagia- still requiring NG tube feedings.  Antimicrobial agents: Anti-infectives (From admission, onward)   Start     Dose/Rate Route Frequency Ordered Stop   08/28/20 1900  vancomycin (VANCOREADY) IVPB 1250 mg/250 mL  Status:  Discontinued        1,250 mg 166.7 mL/hr over 90 Minutes Intravenous Every 24 hours 08/27/20 1820 08/28/20 1204   08/27/20 2200  piperacillin-tazobactam (ZOSYN) IVPB 3.375 g        3.375 g 12.5 mL/hr over 240 Minutes Intravenous Every 8 hours 08/27/20 1412 09/03/20 0141   08/27/20 1915  vancomycin (VANCOREADY) IVPB 2000 mg/400 mL        2,000 mg 200 mL/hr over 120 Minutes Intravenous  Once 08/27/20 1816 08/27/20 2156   08/27/20 1430  piperacillin-tazobactam (ZOSYN) IVPB 3.375 g        3.375 g 100 mL/hr over 30 Minutes Intravenous  Once 08/27/20 1412 08/27/20 1502   08/27/20 1000  ceFEPIme (MAXIPIME) 2 g in sodium chloride  0.9 % 100 mL IVPB  Status:  Discontinued        2 g 200 mL/hr over 30 Minutes Intravenous Every 12 hours 08/27/20 0744 08/27/20 1412   08/26/20 1000  ceFEPIme (MAXIPIME) 2 g in sodium chloride 0.9 % 100 mL IVPB  Status:  Discontinued        2 g 200 mL/hr over 30 Minutes Intravenous Every 8 hours 08/26/20 0848 08/27/20 0744   08/25/20 1200  vancomycin (VANCOCIN) IVPB 1000 mg/200 mL premix  Status:  Discontinued        1,000 mg 200 mL/hr over 60 Minutes Intravenous Every 24 hours 08/24/20 1043 08/24/20 1452   08/24/20 1200  vancomycin (VANCOREADY) IVPB 2000 mg/400 mL  Status:  Discontinued        2,000 mg 200 mL/hr over 120 Minutes Intravenous  Once 08/24/20 1009 08/24/20 1452   08/24/20 1045  ceFEPIme (MAXIPIME) 2 g in sodium chloride 0.9 % 100 mL IVPB  Status:  Discontinued        2 g 200 mL/hr over 30 Minutes Intravenous Every 12 hours 08/24/20 0958 08/26/20 0848   08/24/20 0100  ceFEPIme (MAXIPIME) 2 g in sodium chloride 0.9 % 100 mL IVPB        2 g 200 mL/hr over 30 Minutes Intravenous  Once 08/24/20 0047 08/24/20  4403       Time spent: 25 minutes-Greater than 50% of this time was spent in counseling, explanation of diagnosis, planning of further management, and coordination of care.  MEDICATIONS: Scheduled Meds: . amiodarone  200 mg Per Tube Daily  . apixaban  5 mg Per Tube BID  . aspirin  81 mg Per Tube Daily  . atorvastatin  80 mg Per Tube Daily  . chlorhexidine  15 mL Mouth Rinse BID  . Chlorhexidine Gluconate Cloth  6 each Topical Daily  . donepezil  5 mg Per Tube QHS  . DULoxetine  30 mg Oral QHS  . feeding supplement  237 mL Oral TID BM  . feeding supplement (PROSource TF)  90 mL Per Tube BID  . furosemide  40 mg Oral Daily  . guaiFENesin  5 mL Per Tube Q6H  . insulin aspart  0-20 Units Subcutaneous Q4H  . insulin aspart  4 Units Subcutaneous Q4H  . insulin glargine  40 Units Subcutaneous BID  . lactulose  10 g Per Tube BID  . levETIRAcetam  1,000 mg Per Tube BID   . levothyroxine  175 mcg Per Tube Q0600  . lidocaine  1 patch Transdermal Q24H  . mouth rinse  15 mL Mouth Rinse q12n4p  . metoprolol tartrate  12.5 mg Per Tube BID   Continuous Infusions: . sodium chloride Stopped (09/06/20 1228)  . feeding supplement (OSMOLITE 1.2 CAL) 1,000 mL (09/16/20 1723)   PRN Meds:.sodium chloride, acetaminophen **OR** acetaminophen, hydrALAZINE, ipratropium-albuterol, labetalol, loperamide HCl, melatonin, naLOXone (NARCAN)  injection, ondansetron **OR** ondansetron (ZOFRAN) IV   PHYSICAL EXAM: Vital signs: Vitals:   09/17/20 0342 09/17/20 0500 09/17/20 0805 09/17/20 1151  BP: (!) 102/54  (!) 117/55 (!) 136/91  Pulse: 71  93 79  Resp: _0 Temp: 98 F (36.7 C)  98.7 F (37.1 C) 98 F (36.7 C)  TempSrc: Axillary  Axillary Axillary  SpO2: 100%  99% 94%  Weight:  100.5 kg    Height:       Filed Weights   09/14/20 0400 09/16/20 0500 09/17/20 0500  Weight: 100.5 kg 99.6 kg 100.5 kg   Body mass index is 35.76 kg/m.   Gen Exam: Not in any distress-lying comfortably in bed. HEENT:atraumatic, normocephalic Chest: B/L clear to auscultation anteriorly CVS:S1S2 regular Abdomen:soft non tender, non distended Extremities:no edema Neurology: Has generalized weakness. Skin: no rash  I have personally reviewed following labs and imaging studies  LABORATORY DATA: CBC: Recent Labs  Lab 09/11/20 0203 09/12/20 0107 09/13/20 0329 09/14/20 0124 09/17/20 0207  WBC 6.9 7.6 8.9  --  8.6  HGB 9.1* 8.7* 9.5* 9.4* 9.6*  HCT 28.8* 29.1* 31.2* 30.9* 31.2*  MCV 106.3* 109.4* 107.6*  --  105.4*  PLT 206 191 204  --  474    Basic Metabolic Panel: Recent Labs  Lab 09/11/20 0203 09/12/20 0107 09/14/20 0124 09/17/20 0207  NA 139 140 140 137  K 4.0 4.3 4.6 4.1  CL 105 104 98 98  CO2 27 30 36* 31  GLUCOSE 194* 226* 185* 214*  BUN 26* 26* 24* 28*  CREATININE 0.88 0.91 0.94 1.00  CALCIUM 9.3 9.1 9.1 9.0  MG  --  2.0  --   --     GFR: Estimated  Creatinine Clearance: 61.7 mL/min (by C-G formula based on SCr of 1 mg/dL).  Liver Function Tests: Recent Labs  Lab 09/12/20 0107  AST 21  ALT 21  ALKPHOS 42  BILITOT  0.6  PROT 5.5*  ALBUMIN 2.7*   No results for input(s): LIPASE, AMYLASE in the last 168 hours. Recent Labs  Lab 09/12/20 0107  AMMONIA 15    Coagulation Profile: No results for input(s): INR, PROTIME in the last 168 hours.  Cardiac Enzymes: No results for input(s): CKTOTAL, CKMB, CKMBINDEX, TROPONINI in the last 168 hours.  BNP (last 3 results) No results for input(s): PROBNP in the last 8760 hours.  Lipid Profile: No results for input(s): CHOL, HDL, LDLCALC, TRIG, CHOLHDL, LDLDIRECT in the last 72 hours.  Thyroid Function Tests: No results for input(s): TSH, T4TOTAL, FREET4, T3FREE, THYROIDAB in the last 72 hours.  Anemia Panel: No results for input(s): VITAMINB12, FOLATE, FERRITIN, TIBC, IRON, RETICCTPCT in the last 72 hours.  Urine analysis:    Component Value Date/Time   COLORURINE YELLOW 09/08/2020 1006   APPEARANCEUR HAZY (A) 09/08/2020 1006   LABSPEC 1.023 09/08/2020 1006   PHURINE 5.0 09/08/2020 1006   GLUCOSEU NEGATIVE 09/08/2020 1006   HGBUR NEGATIVE 09/08/2020 1006   BILIRUBINUR NEGATIVE 09/08/2020 1006   KETONESUR NEGATIVE 09/08/2020 1006   PROTEINUR 30 (A) 09/08/2020 1006   UROBILINOGEN 0.2 02/05/2015 1700   NITRITE NEGATIVE 09/08/2020 1006   LEUKOCYTESUR NEGATIVE 09/08/2020 1006    Sepsis Labs: Lactic Acid, Venous    Component Value Date/Time   LATICACIDVEN 1.2 08/23/2020 2347    MICROBIOLOGY: No results found for this or any previous visit (from the past 240 hour(s)).  RADIOLOGY STUDIES/RESULTS: No results found.   LOS: 24 days   Oren Binet, MD  [facility]    To contact the attending provider between 7A-7P or the covering provider during after hours 7P-7A, please log into the web site [email or web address] and access using universal [place] password for  that web site. If you do not have the password, please call the hospital operator.  09/17/2020, 11:55 AM

## 2020-09-17 NOTE — Plan of Care (Signed)
Patient is currently resting in bed. AOx1. Answers yes or no questions. Nods heads. Tolerating tube feedings. Turns self. Daughter at bedside. VSS. On 2L (patients baseline). No concerns overnight. Call bell within reach. Daughter refused for bed alarm to be on.   Problem: Activity: Goal: Ability to tolerate increased activity will improve Outcome: Progressing   Problem: Clinical Measurements: Goal: Ability to maintain a body temperature in the normal range will improve Outcome: Progressing   Problem: Respiratory: Goal: Ability to maintain adequate ventilation will improve Outcome: Progressing Goal: Ability to maintain a clear airway will improve Outcome: Progressing

## 2020-09-17 NOTE — Progress Notes (Signed)
Physical Therapy Treatment Patient Details Name: [name] MRN: [id number] DOB: 08-23-1950 Today's Date: 09/17/2020    History of Present Illness 70 year old female admitted to [facility] 2/20 in the setting of septic shock from pneumonia requiring intubation, (extubated 2/25). MRI Brain 2/25 Acute/subacute 2 mm right centrum semiovale lacunar insult Transferred to [facility] 2/27 with AMS, respiratory failure due to a left lung mucous plug, Intubated 2/27-3/4. 3/8 MRI brain no changes from 2/25  PMhx: HTN, HLD, pulmonary HTN, CHF, AFib, DM, hypothyroidism, OSA    PT Comments    Goal of therapy today to lift to chair to provide more stimulating environment. Pt daughter present during session and very enthusiastic about moving to chair. Pt laying on L side on entry with limited response to therapist during set up for lift. Pt requires modAx2 for rolling to L side and total Ax2 for rolling to R for pad placement. Pt tolerated lift to chair well. Once in upright with legs up, attempted to engage pt in limited therapeutic exercise with pt response <10% to cues. Pt repeatedly tried to roll onto L side in recliner, needing assist to come back into upright. Pt given cues to not scoot down in chair however did not respond. Encouraged daughter to engage her while she was up in the chair to limit scooting behavior. Daughter instructed to call RN/NT if pt scooted hips to edge of recliner so she could be lifted back to bed to insure her safety. Given pt inability to participate in meaningful therapy, PT is discharging from caseload. If participation improves please reorder therapy services. If pt d/c's home with family, she will need wheelchair, hospital bed and hoyer lift as well as maximal HHAide services available.    Follow Up Recommendations  SNF;Supervision/Assistance - 24 hour     Equipment Recommendations  Wheelchair (measurements PT);Hospital bed;Other (comment) (hoyer lift, HHAide)       Precautions /  Restrictions Precautions Precautions: Fall;Other (comment) Precaution Comments: cortrak Restrictions Weight Bearing Restrictions: No    Mobility  Bed Mobility Overal bed mobility: Needs Assistance   Rolling: Mod assist;Total assist         General bed mobility comments: modA for rolling onto L and total A for rolling onto r side for lift pad placement    Transfers Overall transfer level: Needs assistance               General transfer comment: requires total A for lift to chair           Cognition Arousal/Alertness: Awake/alert Behavior During Therapy: Flat affect Overall Cognitive Status: Difficult to assess Area of Impairment: Attention;Following commands;Problem solving                   Current Attention Level: Focused   Following Commands: Follows one step commands inconsistently;Follows one step commands with increased time     Problem Solving: Slow processing;Decreased initiation;Difficulty sequencing;Requires verbal cues;Requires tactile cues General Comments: Pt awake but following ~10% of commands on R side, but not vocalizing at all, reluctant to open eyes      Exercises Other Exercises Other Exercises: AAROM with cross body reach with R UE towards L side in visual field, unable to initiate any movement, when attempted to L to R pt with resistance    General Comments General comments (skin integrity, edema, etc.): VSS Daughter in room, desperate to have mother participate.      Pertinent Vitals/Pain Pain Assessment: Faces Faces Pain Scale: No hurt  PT Goals (current goals can now be found in the care plan section) Acute Rehab PT Goals Patient Stated Goal: for pt to regain strength (per daughter) PT Goal Formulation: With patient/family Time For Goal Achievement: 09/19/20 Potential to Achieve Goals: Fair Progress towards PT goals: Not progressing toward goals - comment (unable to participate in any meaningful way)        PT Plan Other (comment) (discharge from Acute PT)       AM-PAC PT "6 Clicks" Mobility   Outcome Measure  Help needed turning from your back to your side while in a flat bed without using bedrails?: Total Help needed moving from lying on your back to sitting on the side of a flat bed without using bedrails?: Total Help needed moving to and from a bed to a chair (including a wheelchair)?: Total Help needed standing up from a chair using your arms (e.g., wheelchair or bedside chair)?: Total Help needed to walk in hospital room?: Total Help needed climbing 3-5 steps with a railing? : Total 6 Click Score: 6    End of Session Equipment Utilized During Treatment: Oxygen Activity Tolerance: Other (comment) (limited by cognition) Patient left: in chair;with call bell/phone within reach;with chair alarm set Nurse Communication: Mobility status;Need for lift equipment PT Visit Diagnosis: Other abnormalities of gait and mobility (R26.89);Muscle weakness (generalized) (M62.81);Difficulty in walking, not elsewhere classified (R26.2)     Time: 1000-1025 PT Time Calculation (min) (ACUTE ONLY): 25 min  Charges:  $Therapeutic Activity: 23-37 mins                     Dyna Figuereo B. Beverely Risen PT, DPT Acute Rehabilitation Services Pager [phone] Office [phone]    Elon Alas Fleet 09/17/2020, 11:53 AM

## 2020-09-18 LAB — GLUCOSE, CAPILLARY
Glucose-Capillary: 251 mg/dL — ABNORMAL HIGH (ref 70–99)
Glucose-Capillary: 206 mg/dL — ABNORMAL HIGH (ref 70–99)
Glucose-Capillary: 178 mg/dL — ABNORMAL HIGH (ref 70–99)
Glucose-Capillary: 73 mg/dL (ref 70–99)
Glucose-Capillary: 59 mg/dL — ABNORMAL LOW (ref 70–99)
Glucose-Capillary: 61 mg/dL — ABNORMAL LOW (ref 70–99)
Glucose-Capillary: 90 mg/dL (ref 70–99)
Glucose-Capillary: 132 mg/dL — ABNORMAL HIGH (ref 70–99)

## 2020-09-18 MED ORDER — FUROSEMIDE 20 MG PO TABS
20.0000 mg | ORAL_TABLET | Freq: Every day | ORAL | Status: DC
  Administered 2020-09-19 – 2020-09-22 (×4): 20 mg via ORAL
  Filled 2020-09-18 (×4): qty 1

## 2020-09-18 MED ORDER — INSULIN ASPART 100 UNIT/ML ~~LOC~~ SOLN: 0.0000 [IU] | Freq: Three times a day (TID) | SUBCUTANEOUS | Status: DC

## 2020-09-18 MED ORDER — INSULIN GLARGINE 100 UNIT/ML ~~LOC~~ SOLN
30.0000 [IU] | Freq: Two times a day (BID) | SUBCUTANEOUS | Status: DC
  Administered 2020-09-18 – 2020-09-19 (×2): 30 [IU] via SUBCUTANEOUS
  Filled 2020-09-18 (×3): qty 0.3

## 2020-09-18 NOTE — Care Management Important Message (Signed)
Important Message  Patient Details  Name: [name] MRN: [id number] Date of Birth: October 07, 1950   Medicare Important Message Given:  Yes - Important Message mailed due to current National Emergency   Verbal consent obtained due to current National Emergency  Relationship to patient: Self Contact Name: [facility] Call Date: 09/18/20  Time: 1129 Phone: [phone] Outcome: No Answer/Busy Important Message mailed to: Patient address on file    Orson Aloe 09/18/2020, 11:29 AM

## 2020-09-18 NOTE — Progress Notes (Signed)
NG tube removed at this time. Patient tolerated well.  

## 2020-09-18 NOTE — Progress Notes (Addendum)
PROGRESS NOTE        PATIENT DETAILS Name: [name] Age: 70 y.o. Sex: female Date of Birth: 06/26/1951 Admit Date: 08/23/2020 Admitting Physician Bennie Pierini, MD [id number], [name], MD  Brief Narrative: Patient is a 70 y.o. female with past medical history of chronic hypoxic respiratory failure on 2-3 L of oxygen at home, chronic diastolic heart failure, DM-2, A. fib, HTN, seizure disorder-who was admitted to [facility] on 2/20 with a faecalis left lower lobe pneumonia/septic shock requiring mechanical ventilation-she was extubated on 2/25-but subsequently decompensated and was reintubated on 2/27.  She had left lower lobe/partial left upper lobe collapse due to mucous plugging-and improved after bronchoscopy on 2/27.  She was subsequently extubated on 3/4-however further hospital course was complicated by somnolence/encephalopathy and dysphagia requiring NG tube feeding.  See below for further details.    Significant events: 2/20 Admitted to [facility], intubated 2/22 Stop amiodarone (esr 95) 2/23 Solumedrol trial @ 80 q 12 and reduced to 40 mg q 12h Pm 2/25 2/25 Extubated 2/26 Worsening mental status/respiratory failure 2/27 Transferred to [facility], reintubated, bronched 2/28 Decreasing vent settings 3/01 Failed vent wean 2/2 increased HR/RR 3/4 Extubated , bipap q hs 3/18 NG tube discontinued  Significant studies: 2/21>> CT head: No acute abnormalities 2/21>> CT chest: Left upper lobe/bilateral lower lobe infiltrates 2/21>> CT abdomen/pelvis: Sigmoid diverticulosis, fat-containing umbilical hernia 5/73>> CT C-spine: No acute abnormalities 2/21>> A1c: 7.9 2/25>> MRI brain: Acute/subacute 2 mm right centrum infarct 2/26>> Echo: EF 50-55% 2/26>> carotid Doppler: No significant stenosis on the left, right carotid system not evaluated due to overlying bandages. 2/26>> LDL: 88 3/6>> CT head: No acute intracranial abnormality. 3/7>> EEG: No seizures-diffuse  encephalopathy 3/8>> MRI brain: No acute infarct/hemorrhage/mass  Antimicrobial therapy: See Below  Microbiology data: 2/22>> blood culture 1/2>> staph epidermidis 2/22>> tracheal aspirate: Enterococcus faecalis 2/27>> urine culture: 80,000 colonies/mL yeast 2/27>> tracheal aspirate: Rare Candida Dubliniensis  Procedures : ETT 2/20 >>2/25, 2/27 >>3/4 R IJ CVL 2/21 >>  Consults: PCCM, neurology  DVT Prophylaxis : SCDs Start: 08/24/20 0945 apixaban (ELIQUIS) tablet 5 mg    Subjective: Per daughter at bedside-starting to eat quite a bit-she finished a significant portion of lunch yesterday-8 on 40% of her breakfast this morning.  Patient is awake-alert-we will discontinue NG tube and see how she does.   Assessment/Plan: Acute on chronic hypoxic respiratory failure due to left mainstem mucous plugging/HCAP: Continue pulmonary toileting-mobilize as much as possible-has completed a course of antimicrobial therapy.  Culture data as above.  Remains stable on 2-3 L of oxygen-which is her baseline.  Acute metabolic encephalopathy: Multifactorial etiology-due to ICU delirium/underlying dementia/pneumonia.  Her mentation has improved-continue to minimize sedatives/narcotics.  Work-up as above-neurology has signed off.    Acute CVA: Seen on MRI on 2/25-incidental-likely clinically insignificant.  Work-up as above-already on full dose anticoagulation.  Dysphagia: Tolerating dysphagia 2 diet-Per daughter at bedside-she is now able to consume a significant portion of her meals.  Discontinue NG tube-watch closely-and see how she does.    Normocytic anemia: Due to acute illness-no evidence of blood loss.  Transfuse if hemoglobin <7  Dementia: Following simple commands-continue Aricept  Seizure disorder: On Keppra-EEG/MRI brain as above.  Chronic atrial fibrillation: Continue amiodarone-anticoagulated with Eliquis  Chronic diastolic heart failure: Euvolemic on exam-continue Lasix  HTN:  Controlled-continue metoprolol-follow and adjust.  HLD: On statin  Hypothyroidism: Continue Synthroid  IDDM-2: CBG stable-NG tube/tube feeds being discontinued today-we will [place] decrease Lantus to 30 units twice daily-and start on SSI.  Watch closely-May need further adjustment depending on how she eats.     Recent Labs    09/18/20 0350 09/18/20 0731 09/18/20 1157  GLUCAP 251* 206* 178*   Severe debility/deconditioning/functional quadriplegia: Continue PT/OT eval.  Obesity: Estimated body mass index is 35.48 kg/m as calculated from the following:   Height as of this encounter: '5\' 6"'  (1.676 m).   Weight as of this encounter: 99.7 kg.     Diet: Diet Order            DIET DYS 2 Room service appropriate? No; Fluid consistency: Thin  Diet effective now                  Code Status: Full code   Family Communication: Daughter at bedside  Disposition Plan: Status is: Inpatient  Remains inpatient appropriate because:Inpatient level of care appropriate due to severity of illness   Dispo: The patient is from: Home              Anticipated d/c is to: Home              Patient currently is not medically stable to d/c.   Difficult to place patient No     Barriers to Discharge: Dysphagia- still requiring NG tube feedings.  Antimicrobial agents: Anti-infectives (From admission, onward)   Start     Dose/Rate Route Frequency Ordered Stop   08/28/20 1900  vancomycin (VANCOREADY) IVPB 1250 mg/250 mL  Status:  Discontinued        1,250 mg 166.7 mL/hr over 90 Minutes Intravenous Every 24 hours 08/27/20 1820 08/28/20 1204   08/27/20 2200  piperacillin-tazobactam (ZOSYN) IVPB 3.375 g        3.375 g 12.5 mL/hr over 240 Minutes Intravenous Every 8 hours 08/27/20 1412 09/03/20 0141   08/27/20 1915  vancomycin (VANCOREADY) IVPB 2000 mg/400 mL        2,000 mg 200 mL/hr over 120 Minutes Intravenous  Once 08/27/20 1816 08/27/20 2156   08/27/20 1430  piperacillin-tazobactam  (ZOSYN) IVPB 3.375 g        3.375 g 100 mL/hr over 30 Minutes Intravenous  Once 08/27/20 1412 08/27/20 1502   08/27/20 1000  ceFEPIme (MAXIPIME) 2 g in sodium chloride 0.9 % 100 mL IVPB  Status:  Discontinued        2 g 200 mL/hr over 30 Minutes Intravenous Every 12 hours 08/27/20 0744 08/27/20 1412   08/26/20 1000  ceFEPIme (MAXIPIME) 2 g in sodium chloride 0.9 % 100 mL IVPB  Status:  Discontinued        2 g 200 mL/hr over 30 Minutes Intravenous Every 8 hours 08/26/20 0848 08/27/20 0744   08/25/20 1200  vancomycin (VANCOCIN) IVPB 1000 mg/200 mL premix  Status:  Discontinued        1,000 mg 200 mL/hr over 60 Minutes Intravenous Every 24 hours 08/24/20 1043 08/24/20 1452   08/24/20 1200  vancomycin (VANCOREADY) IVPB 2000 mg/400 mL  Status:  Discontinued        2,000 mg 200 mL/hr over 120 Minutes Intravenous  Once 08/24/20 1009 08/24/20 1452   08/24/20 1045  ceFEPIme (MAXIPIME) 2 g in sodium chloride 0.9 % 100 mL IVPB  Status:  Discontinued        2 g 200 mL/hr over 30 Minutes Intravenous Every 12 hours 08/24/20 0958 08/26/20 0848  08/24/20 0100  ceFEPIme (MAXIPIME) 2 g in sodium chloride 0.9 % 100 mL IVPB        2 g 200 mL/hr over 30 Minutes Intravenous  Once 08/24/20 0047 08/24/20 0219       Time spent: 25 minutes-Greater than 50% of this time was spent in counseling, explanation of diagnosis, planning of further management, and coordination of care.  MEDICATIONS: Scheduled Meds: . amiodarone  200 mg Per Tube Daily  . apixaban  5 mg Per Tube BID  . aspirin  81 mg Per Tube Daily  . atorvastatin  80 mg Per Tube Daily  . chlorhexidine  15 mL Mouth Rinse BID  . Chlorhexidine Gluconate Cloth  6 each Topical Daily  . donepezil  5 mg Per Tube QHS  . DULoxetine  30 mg Oral QHS  . feeding supplement  237 mL Oral TID BM  . feeding supplement (PROSource TF)  90 mL Per Tube BID  . furosemide  40 mg Oral Daily  . guaiFENesin  5 mL Per Tube Q6H  . insulin aspart  0-20 Units Subcutaneous  Q4H  . insulin aspart  4 Units Subcutaneous Q4H  . insulin glargine  40 Units Subcutaneous BID  . lactulose  10 g Per Tube BID  . levETIRAcetam  1,000 mg Per Tube BID  . levothyroxine  175 mcg Per Tube Q0600  . lidocaine  1 patch Transdermal Q24H  . mouth rinse  15 mL Mouth Rinse q12n4p  . metoprolol tartrate  12.5 mg Per Tube BID   Continuous Infusions: . sodium chloride Stopped (09/06/20 1228)  . feeding supplement (OSMOLITE 1.2 CAL) 1,000 mL (09/17/20 1707)   PRN Meds:.sodium chloride, acetaminophen **OR** acetaminophen, hydrALAZINE, ipratropium-albuterol, labetalol, loperamide HCl, melatonin, naLOXone (NARCAN)  injection, ondansetron **OR** ondansetron (ZOFRAN) IV   PHYSICAL EXAM: Vital signs: Vitals:   09/18/20 0349 09/18/20 0500 09/18/20 0733 09/18/20 1255  BP: 102/64  106/67 121/84  Pulse: 95  92 92  Resp: '16  20 18  ' Temp: 98.4 F (36.9 C)  98.7 F (37.1 C) 98.7 F (37.1 C)  TempSrc: Axillary  Axillary Axillary  SpO2: 95%  92% 100%  Weight:  99.7 kg    Height:       Filed Weights   09/16/20 0500 09/17/20 0500 09/18/20 0500  Weight: 99.6 kg 100.5 kg 99.7 kg   Body mass index is 35.48 kg/m.   Gen Exam:Alert awake-not in any distress HEENT:atraumatic, normocephalic Chest: B/L clear to auscultation anteriorly CVS:S1S2 regular Abdomen:soft non tender, non distended Extremities:no edema Neurology: Non focal Skin: no rash  I have personally reviewed following labs and imaging studies  LABORATORY DATA: CBC: Recent Labs  Lab 09/12/20 0107 09/13/20 0329 09/14/20 0124 09/17/20 0207  WBC 7.6 8.9  --  8.6  HGB 8.7* 9.5* 9.4* 9.6*  HCT 29.1* 31.2* 30.9* 31.2*  MCV 109.4* 107.6*  --  105.4*  PLT 191 204  --  389    Basic Metabolic Panel: Recent Labs  Lab 09/12/20 0107 09/14/20 0124 09/17/20 0207  NA 140 140 137  K 4.3 4.6 4.1  CL 104 98 98  CO2 30 36* 31  GLUCOSE 226* 185* 214*  BUN 26* 24* 28*  CREATININE 0.91 0.94 1.00  CALCIUM 9.1 9.1 9.0  MG  2.0  --   --     GFR: Estimated Creatinine Clearance: 61.5 mL/min (by C-G formula based on SCr of 1 mg/dL).  Liver Function Tests: Recent Labs  Lab 09/12/20 0107  AST 21  ALT 21  ALKPHOS 42  BILITOT 0.6  PROT 5.5*  ALBUMIN 2.7*   No results for input(s): LIPASE, AMYLASE in the last 168 hours. Recent Labs  Lab 09/12/20 0107  AMMONIA 15    Coagulation Profile: No results for input(s): INR, PROTIME in the last 168 hours.  Cardiac Enzymes: No results for input(s): CKTOTAL, CKMB, CKMBINDEX, TROPONINI in the last 168 hours.  BNP (last 3 results) No results for input(s): PROBNP in the last 8760 hours.  Lipid Profile: No results for input(s): CHOL, HDL, LDLCALC, TRIG, CHOLHDL, LDLDIRECT in the last 72 hours.  Thyroid Function Tests: No results for input(s): TSH, T4TOTAL, FREET4, T3FREE, THYROIDAB in the last 72 hours.  Anemia Panel: No results for input(s): VITAMINB12, FOLATE, FERRITIN, TIBC, IRON, RETICCTPCT in the last 72 hours.  Urine analysis:    Component Value Date/Time   COLORURINE YELLOW 09/08/2020 1006   APPEARANCEUR HAZY (A) 09/08/2020 1006   LABSPEC 1.023 09/08/2020 1006   PHURINE 5.0 09/08/2020 1006   GLUCOSEU NEGATIVE 09/08/2020 1006   HGBUR NEGATIVE 09/08/2020 1006   BILIRUBINUR NEGATIVE 09/08/2020 1006   KETONESUR NEGATIVE 09/08/2020 1006   PROTEINUR 30 (A) 09/08/2020 1006   UROBILINOGEN 0.2 02/05/2015 1700   NITRITE NEGATIVE 09/08/2020 1006   LEUKOCYTESUR NEGATIVE 09/08/2020 1006    Sepsis Labs: Lactic Acid, Venous    Component Value Date/Time   LATICACIDVEN 1.2 08/23/2020 2347    MICROBIOLOGY: No results found for this or any previous visit (from the past 240 hour(s)).  RADIOLOGY STUDIES/RESULTS: No results found.   LOS: 25 days   Oren Binet, MD  [facility]    To contact the attending provider between 7A-7P or the covering provider during after hours 7P-7A, please log into the web site [email or web address] and access using  universal [place] password for that web site. If you do not have the password, please call the hospital operator.  09/18/2020, 2:38 PM

## 2020-09-19 LAB — GLUCOSE, CAPILLARY
Glucose-Capillary: 72 mg/dL (ref 70–99)
Glucose-Capillary: 91 mg/dL (ref 70–99)
Glucose-Capillary: 44 mg/dL — CL (ref 70–99)
Glucose-Capillary: 50 mg/dL — ABNORMAL LOW (ref 70–99)
Glucose-Capillary: 73 mg/dL (ref 70–99)
Glucose-Capillary: 86 mg/dL (ref 70–99)

## 2020-09-19 LAB — BASIC METABOLIC PANEL
Anion gap: 7 (ref 5–15)
BUN: 26 mg/dL — ABNORMAL HIGH (ref 8–23)
CO2: 32 mmol/L (ref 22–32)
Calcium: 8.9 mg/dL (ref 8.9–10.3)
Chloride: 98 mmol/L (ref 98–111)
Creatinine, Ser: 0.94 mg/dL (ref 0.44–1.00)
GFR, Estimated: >60 mL/min (ref >60)
Glucose, Bld: 101 mg/dL — ABNORMAL HIGH (ref 70–99)
Potassium: 3.8 mmol/L (ref 3.5–5.1)
Sodium: 137 mmol/L (ref 135–145)

## 2020-09-19 MED ORDER — INSULIN ASPART 100 UNIT/ML ~~LOC~~ SOLN
0.0000 [IU] | Freq: Three times a day (TID) | SUBCUTANEOUS | Status: DC
  Administered 2020-09-20: 2 [IU] via SUBCUTANEOUS
  Administered 2020-09-21 – 2020-09-22 (×4): 1 [IU] via SUBCUTANEOUS

## 2020-09-19 MED ORDER — GABAPENTIN 100 MG PO CAPS
100.0000 mg | ORAL_CAPSULE | Freq: Three times a day (TID) | ORAL | Status: DC
  Administered 2020-09-19 – 2020-09-22 (×11): 100 mg via ORAL
  Filled 2020-09-19 (×11): qty 1

## 2020-09-19 MED ORDER — DEXTROSE 50 % IV SOLN
INTRAVENOUS | Status: AC
  Filled 2020-09-19: qty 50

## 2020-09-19 MED ORDER — INSULIN GLARGINE 100 UNIT/ML ~~LOC~~ SOLN
20.0000 [IU] | Freq: Every day | SUBCUTANEOUS | Status: DC
  Filled 2020-09-19: qty 0.2

## 2020-09-19 MED ORDER — INSULIN GLARGINE 100 UNIT/ML ~~LOC~~ SOLN: 18.0000 [IU] | Freq: Two times a day (BID) | SUBCUTANEOUS | Status: DC

## 2020-09-19 NOTE — Progress Notes (Signed)
PROGRESS NOTE        PATIENT DETAILS Name: [name] Age: 70 y.o. Sex: female Date of Birth: May 08, 1951 Admit Date: 08/23/2020 Admitting Physician Bennie Pierini, MD [id number], [name], MD  Brief Narrative: Patient is a 70 y.o. female with past medical history of chronic hypoxic respiratory failure on 2-3 L of oxygen at home, chronic diastolic heart failure, DM-2, A. fib, HTN, seizure disorder-who was admitted to [facility] on 2/20 with a faecalis left lower lobe pneumonia/septic shock requiring mechanical ventilation-she was extubated on 2/25-but subsequently decompensated and was reintubated on 2/27.  She had left lower lobe/partial left upper lobe collapse due to mucous plugging-and improved after bronchoscopy on 2/27.  She was subsequently extubated on 3/4-however further hospital course was complicated by somnolence/encephalopathy and dysphagia requiring NG tube feeding.  See below for further details.    Significant events: 2/20 Admitted to [facility], intubated 2/22 Stop amiodarone (esr 95) 2/23 Solumedrol trial @ 80 q 12 and reduced to 40 mg q 12h Pm 2/25 2/25 Extubated 2/26 Worsening mental status/respiratory failure 2/27 Transferred to [facility], reintubated, bronched 2/28 Decreasing vent settings 3/01 Failed vent wean 2/2 increased HR/RR 3/4 Extubated , bipap q hs 3/18 NG tube discontinued  Significant studies: 2/21>> CT head: No acute abnormalities 2/21>> CT chest: Left upper lobe/bilateral lower lobe infiltrates 2/21>> CT abdomen/pelvis: Sigmoid diverticulosis, fat-containing umbilical hernia 4/85>> CT C-spine: No acute abnormalities 2/21>> A1c: 7.9 2/25>> MRI brain: Acute/subacute 2 mm right centrum infarct 2/26>> Echo: EF 50-55% 2/26>> carotid Doppler: No significant stenosis on the left, right carotid system not evaluated due to overlying bandages. 2/26>> LDL: 88 3/6>> CT head: No acute intracranial abnormality. 3/7>> EEG: No seizures-diffuse  encephalopathy 3/8>> MRI brain: No acute infarct/hemorrhage/mass  Antimicrobial therapy: See Below  Microbiology data: 2/22>> blood culture 1/2>> staph epidermidis 2/22>> tracheal aspirate: Enterococcus faecalis 2/27>> urine culture: 80,000 colonies/mL yeast 2/27>> tracheal aspirate: Rare Candida Dubliniensis  Procedures : ETT 2/20 >>2/25, 2/27 >>3/4 R IJ CVL 2/21 >>  Consults: PCCM, neurology  DVT Prophylaxis : SCDs Start: 08/24/20 0945 apixaban (ELIQUIS) tablet 5 mg    Subjective: Lying comfortably in bed-no major events overnight-NG tube was removed on 3/18-Per daughter-eating somewhat-around 30-40% of her meals.  Able to drink a full bottle/half bottle of Ensure in between her meals.   Assessment/Plan: Acute on chronic hypoxic respiratory failure due to left mainstem mucous plugging/HCAP: Continue pulmonary toileting-mobilize as much as possible-has completed a course of antimicrobial therapy.  Culture data as above.  Remains stable on 2-3 L of oxygen-which is her baseline.  Acute metabolic encephalopathy: Multifactorial etiology-due to ICU delirium/underlying dementia/pneumonia.  Her mentation has improved-continue to minimize sedatives/narcotics.  Work-up as above-neurology has signed off.    Acute CVA: Seen on MRI on 2/25-incidental-likely clinically insignificant.  Work-up as above-already on full dose anticoagulation.  Dysphagia: Tolerating dysphagia 2 diet-per daughter-able to consume around 30-40% of her meals.  NG tube discontinued on 3/18.  Normocytic anemia: Due to acute illness-no evidence of blood loss.  Transfuse if hemoglobin <7  Dementia: Following simple commands-continue Aricept  Seizure disorder: On Keppra-EEG/MRI brain as above.  Chronic atrial fibrillation: Continue amiodarone-anticoagulated with Eliquis  Chronic diastolic heart failure: Euvolemic on exam-continue Lasix  HTN: Controlled-continue metoprolol-follow and adjust.  HLD: On  statin  Hypothyroidism: Continue Synthroid  IDDM-2: Hypoglycemic episodes over the past 24 hours-no longer on NG  tube feedings-eating around 30-40% of her meals plus Ensure in between meals.  Change Lantus to 20 units-daily dosing-change SSI to extra sensitive scale.  Follow and adjust accordingly.   Recent Labs    09/18/20 2249 09/19/20 0752 09/19/20 1234  GLUCAP 132* 72 91   Severe debility/deconditioning/functional quadriplegia: Continue PT/OT eval.  Obesity: Estimated body mass index is 35.8 kg/m as calculated from the following:   Height as of this encounter: '5\' 6"'  (1.676 m).   Weight as of this encounter: 100.6 kg.     Diet: Diet Order            DIET DYS 2 Room service appropriate? No; Fluid consistency: Thin  Diet effective now                  Code Status: Full code   Family Communication: Daughter at bedside  Disposition Plan: Status is: Inpatient  Remains inpatient appropriate because:Inpatient level of care appropriate due to severity of illness   Dispo: The patient is from: Home              Anticipated d/c is to: Home              Patient currently is not medically stable to d/c.   Difficult to place patient No     Barriers to Discharge: Awaiting diet stabilization-CBG/glycemic control stabilization before consideration of discharge-suspect discharge early next week.  Antimicrobial agents: Anti-infectives (From admission, onward)   Start     Dose/Rate Route Frequency Ordered Stop   08/28/20 1900  vancomycin (VANCOREADY) IVPB 1250 mg/250 mL  Status:  Discontinued        1,250 mg 166.7 mL/hr over 90 Minutes Intravenous Every 24 hours 08/27/20 1820 08/28/20 1204   08/27/20 2200  piperacillin-tazobactam (ZOSYN) IVPB 3.375 g        3.375 g 12.5 mL/hr over 240 Minutes Intravenous Every 8 hours 08/27/20 1412 09/03/20 0141   08/27/20 1915  vancomycin (VANCOREADY) IVPB 2000 mg/400 mL        2,000 mg 200 mL/hr over 120 Minutes Intravenous  Once  08/27/20 1816 08/27/20 2156   08/27/20 1430  piperacillin-tazobactam (ZOSYN) IVPB 3.375 g        3.375 g 100 mL/hr over 30 Minutes Intravenous  Once 08/27/20 1412 08/27/20 1502   08/27/20 1000  ceFEPIme (MAXIPIME) 2 g in sodium chloride 0.9 % 100 mL IVPB  Status:  Discontinued        2 g 200 mL/hr over 30 Minutes Intravenous Every 12 hours 08/27/20 0744 08/27/20 1412   08/26/20 1000  ceFEPIme (MAXIPIME) 2 g in sodium chloride 0.9 % 100 mL IVPB  Status:  Discontinued        2 g 200 mL/hr over 30 Minutes Intravenous Every 8 hours 08/26/20 0848 08/27/20 0744   08/25/20 1200  vancomycin (VANCOCIN) IVPB 1000 mg/200 mL premix  Status:  Discontinued        1,000 mg 200 mL/hr over 60 Minutes Intravenous Every 24 hours 08/24/20 1043 08/24/20 1452   08/24/20 1200  vancomycin (VANCOREADY) IVPB 2000 mg/400 mL  Status:  Discontinued        2,000 mg 200 mL/hr over 120 Minutes Intravenous  Once 08/24/20 1009 08/24/20 1452   08/24/20 1045  ceFEPIme (MAXIPIME) 2 g in sodium chloride 0.9 % 100 mL IVPB  Status:  Discontinued        2 g 200 mL/hr over 30 Minutes Intravenous Every 12 hours 08/24/20 0958 08/26/20 0848  08/24/20 0100  ceFEPIme (MAXIPIME) 2 g in sodium chloride 0.9 % 100 mL IVPB        2 g 200 mL/hr over 30 Minutes Intravenous  Once 08/24/20 0047 08/24/20 0219       Time spent: 25 minutes-Greater than 50% of this time was spent in counseling, explanation of diagnosis, planning of further management, and coordination of care.  MEDICATIONS: Scheduled Meds: . amiodarone  200 mg Per Tube Daily  . apixaban  5 mg Per Tube BID  . aspirin  81 mg Per Tube Daily  . atorvastatin  80 mg Per Tube Daily  . chlorhexidine  15 mL Mouth Rinse BID  . Chlorhexidine Gluconate Cloth  6 each Topical Daily  . donepezil  5 mg Per Tube QHS  . DULoxetine  30 mg Oral QHS  . feeding supplement  237 mL Oral TID BM  . furosemide  20 mg Oral Daily  . guaiFENesin  5 mL Per Tube Q6H  . insulin aspart  0-9 Units  Subcutaneous TID WC  . insulin glargine  18 Units Subcutaneous BID  . lactulose  10 g Per Tube BID  . levETIRAcetam  1,000 mg Per Tube BID  . levothyroxine  175 mcg Per Tube Q0600  . lidocaine  1 patch Transdermal Q24H  . mouth rinse  15 mL Mouth Rinse q12n4p  . metoprolol tartrate  12.5 mg Per Tube BID   Continuous Infusions: . sodium chloride Stopped (09/06/20 1228)   PRN Meds:.sodium chloride, acetaminophen **OR** acetaminophen, hydrALAZINE, ipratropium-albuterol, labetalol, loperamide HCl, melatonin, naLOXone (NARCAN)  injection, ondansetron **OR** ondansetron (ZOFRAN) IV   PHYSICAL EXAM: Vital signs: Vitals:   09/19/20 0401 09/19/20 0505 09/19/20 0753 09/19/20 1231  BP:  105/69 (!) 99/53 111/74  Pulse: 84 90 92   Resp: '16 16 18   ' Temp:  97.8 F (36.6 C) 97.8 F (36.6 C) 97.6 F (36.4 C)  TempSrc:  Axillary Axillary Axillary  SpO2: 97% 99% 99%   Weight: 100.6 kg     Height:       Filed Weights   09/17/20 0500 09/18/20 0500 09/19/20 0401  Weight: 100.5 kg 99.7 kg 100.6 kg   Body mass index is 35.8 kg/m.   Gen Exam:Alert awake-not in any distress HEENT:atraumatic, normocephalic Chest: B/L clear to auscultation anteriorly CVS:S1S2 regular Abdomen:soft non tender, non distended Extremities:no edema Neurology: Non focal Skin: no rash  I have personally reviewed following labs and imaging studies  LABORATORY DATA: CBC: Recent Labs  Lab 09/13/20 0329 09/14/20 0124 09/17/20 0207  WBC 8.9  --  8.6  HGB 9.5* 9.4* 9.6*  HCT 31.2* 30.9* 31.2*  MCV 107.6*  --  105.4*  PLT 204  --  387    Basic Metabolic Panel: Recent Labs  Lab 09/14/20 0124 09/17/20 0207 09/19/20 0231  NA 140 137 137  K 4.6 4.1 3.8  CL 98 98 98  CO2 36* 31 32  GLUCOSE 185* 214* 101*  BUN 24* 28* 26*  CREATININE 0.94 1.00 0.94  CALCIUM 9.1 9.0 8.9    GFR: Estimated Creatinine Clearance: 65.7 mL/min (by C-G formula based on SCr of 0.94 mg/dL).  Liver Function Tests: No results  for input(s): AST, ALT, ALKPHOS, BILITOT, PROT, ALBUMIN in the last 168 hours. No results for input(s): LIPASE, AMYLASE in the last 168 hours. No results for input(s): AMMONIA in the last 168 hours.  Coagulation Profile: No results for input(s): INR, PROTIME in the last 168 hours.  Cardiac Enzymes: No results  for input(s): CKTOTAL, CKMB, CKMBINDEX, TROPONINI in the last 168 hours.  BNP (last 3 results) No results for input(s): PROBNP in the last 8760 hours.  Lipid Profile: No results for input(s): CHOL, HDL, LDLCALC, TRIG, CHOLHDL, LDLDIRECT in the last 72 hours.  Thyroid Function Tests: No results for input(s): TSH, T4TOTAL, FREET4, T3FREE, THYROIDAB in the last 72 hours.  Anemia Panel: No results for input(s): VITAMINB12, FOLATE, FERRITIN, TIBC, IRON, RETICCTPCT in the last 72 hours.  Urine analysis:    Component Value Date/Time   COLORURINE YELLOW 09/08/2020 1006   APPEARANCEUR HAZY (A) 09/08/2020 1006   LABSPEC 1.023 09/08/2020 1006   PHURINE 5.0 09/08/2020 1006   GLUCOSEU NEGATIVE 09/08/2020 1006   HGBUR NEGATIVE 09/08/2020 1006   BILIRUBINUR NEGATIVE 09/08/2020 1006   KETONESUR NEGATIVE 09/08/2020 1006   PROTEINUR 30 (A) 09/08/2020 1006   UROBILINOGEN 0.2 02/05/2015 1700   NITRITE NEGATIVE 09/08/2020 1006   LEUKOCYTESUR NEGATIVE 09/08/2020 1006    Sepsis Labs: Lactic Acid, Venous    Component Value Date/Time   LATICACIDVEN 1.2 08/23/2020 2347    MICROBIOLOGY: No results found for this or any previous visit (from the past 240 hour(s)).  RADIOLOGY STUDIES/RESULTS: No results found.   LOS: 26 days   Oren Binet, MD  [facility]    To contact the attending provider between 7A-7P or the covering provider during after hours 7P-7A, please log into the web site [email or web address] and access using universal [place] password for that web site. If you do not have the password, please call the hospital operator.  09/19/2020, 2:45 PM

## 2020-09-19 NOTE — Progress Notes (Signed)
.  Hypoglycemic Event  CBG: 59  Time: 21:14  Treatment: 4 oz juice/soda Pt took an extended amount of time to drink beverage.  F/u CBG was delayed d/t this time.  Symptoms: None  Follow-up CBG: Time: 90 CBG Result:2143  Possible Reasons for Event: Unknown    [name]

## 2020-09-19 NOTE — Progress Notes (Signed)
9:00PM Nurse called to report that patient's daughter was requesting for CGM.  Patient's blood glucose level has been intermittently within hypoglycemic range, which required more frequent monitoring of blood glucose level and subsequent patient's discomfort with frequent needlesticks.  CGM was started.

## 2020-09-20 ENCOUNTER — Inpatient Hospital Stay (HOSPITAL_COMMUNITY): Payer: PPO

## 2020-09-20 LAB — BASIC METABOLIC PANEL
Anion gap: 8 (ref 5–15)
BUN: 22 mg/dL (ref 8–23)
CO2: 32 mmol/L (ref 22–32)
Calcium: 8.5 mg/dL — ABNORMAL LOW (ref 8.9–10.3)
Chloride: 97 mmol/L — ABNORMAL LOW (ref 98–111)
Creatinine, Ser: 1.02 mg/dL — ABNORMAL HIGH (ref 0.44–1.00)
GFR, Estimated: 59 mL/min — ABNORMAL LOW (ref >60)
Glucose, Bld: 95 mg/dL (ref 70–99)
Potassium: 3.8 mmol/L (ref 3.5–5.1)
Sodium: 137 mmol/L (ref 135–145)

## 2020-09-20 MED ORDER — INSULIN GLARGINE 100 UNIT/ML ~~LOC~~ SOLN
10.0000 [IU] | Freq: Every day | SUBCUTANEOUS | Status: DC
  Administered 2020-09-20 – 2020-09-21 (×2): 10 [IU] via SUBCUTANEOUS
  Filled 2020-09-20 (×3): qty 0.1

## 2020-09-20 NOTE — Care Management (Signed)
    Durable Medical Equipment  (From admission, onward)         Start     Ordered   09/20/20 1202  For home use only DME Hospital bed  Once       Question Answer Comment  Length of Need Lifetime   Patient has (list medical condition): debility, weakness, swallowing impairment   The above medical condition requires: Patient requires the ability to reposition frequently   Head must be elevated greater than: 30 degrees   Bed type Semi-electric   Hoyer Lift Yes   Support Surface: Low Air loss Mattress      09/20/20 1202   09/20/20 1202  For home use only DME Other see comment  Once       Comments: Bed side table for hospital bed  Question:  Length of Need  Answer:  Lifetime   09/20/20 1202

## 2020-09-20 NOTE — TOC Progression Note (Signed)
Transition of Care [facility]) - Progression Note    Patient Details  Name: TALOR DESROSIERS MRN: [id number] Date of Birth: [birth year]  Transition [facility]) CM/SW Contact  Lawerance Sabal, RN Phone Number: 09/20/2020, 12:05 PM  Clinical Narrative:    Sherron Monday w patient's daughter at bedside to discuss DME needs as requested by MD. Potential DC tomorrow. [facility] out of stock on hospital beds. Will reach out to other vendors throughout the day.     Expected Discharge Plan: Home w Home Health Services Barriers to Discharge: Continued Medical Work up  Expected Discharge Plan and Services Expected Discharge Plan: Home w Home Health Services In-house Referral: Clinical Social Work Discharge Planning Services: CM Consult Post Acute Care Choice: Durable Medical Equipment,Home Health Living arrangements for the past 2 months: Single Family Home                 DME Arranged: Hospital bed (hoyer and bedside table)           HH Agency: [facility]         Social Determinants of Health (SDOH) Interventions    Readmission Risk Interventions Readmission Risk Prevention Plan 09/16/2020  Transportation Screening Complete  Medication Review Oceanographer) Complete  PCP or Specialist appointment within 3-5 days of discharge Complete  HRI or Home Care Consult Complete  SW Recovery Care/Counseling Consult Complete  Palliative Care Screening Complete  Skilled Nursing Facility Complete  Some recent data might be hidden

## 2020-09-20 NOTE — Progress Notes (Addendum)
PROGRESS NOTE        PATIENT DETAILS Name: [name] Age: 70 y.o. Sex: female Date of Birth: [birth year] Admit Date: 08/23/2020 Admitting Physician Bennie Pierini, MD [id number], [name], MD  Brief Narrative: Patient is a 70 y.o. female with past medical history of chronic hypoxic respiratory failure on 2-3 L of oxygen at home, chronic diastolic heart failure, DM-2, A. fib, HTN, seizure disorder-who was admitted to [facility] on 2/20 with a faecalis left lower lobe pneumonia/septic shock requiring mechanical ventilation-she was extubated on 2/25-but subsequently decompensated and was reintubated on 2/27.  She had left lower lobe/partial left upper lobe collapse due to mucous plugging-and improved after bronchoscopy on 2/27.  She was subsequently extubated on 3/4-however further hospital course was complicated by somnolence/encephalopathy and dysphagia requiring NG tube feeding.  See below for further details.    Significant events: 2/20 Admitted to [facility], intubated 2/22 Stop amiodarone (esr 95) 2/23 Solumedrol trial @ 80 q 12 and reduced to 40 mg q 12h Pm 2/25 2/25 Extubated 2/26 Worsening mental status/respiratory failure 2/27 Transferred to [facility], reintubated, bronched 2/28 Decreasing vent settings 3/01 Failed vent wean 2/2 increased HR/RR 3/4 Extubated , bipap q hs 3/18 NG tube discontinued  Significant studies: 2/21>> CT head: No acute abnormalities 2/21>> CT chest: Left upper lobe/bilateral lower lobe infiltrates 2/21>> CT abdomen/pelvis: Sigmoid diverticulosis, fat-containing umbilical hernia 0/99>> CT C-spine: No acute abnormalities 2/21>> A1c: 7.9 2/25>> MRI brain: Acute/subacute 2 mm right centrum infarct 2/26>> Echo: EF 50-55% 2/26>> carotid Doppler: No significant stenosis on the left, right carotid system not evaluated due to overlying bandages. 2/26>> LDL: 88 3/6>> CT head: No acute intracranial abnormality. 3/7>> EEG: No seizures-diffuse  encephalopathy 3/8>> MRI brain: No acute infarct/hemorrhage/mass  Antimicrobial therapy: See Below  Microbiology data: 2/22>> blood culture 1/2>> staph epidermidis 2/22>> tracheal aspirate: Enterococcus faecalis 2/27>> urine culture: 80,000 colonies/mL yeast 2/27>> tracheal aspirate: Rare Candida Dubliniensis  Procedures : ETT 2/20 >>2/25, 2/27 >>3/4 R IJ CVL 2/21 >>  Consults: PCCM, neurology  DVT Prophylaxis : SCDs Start: 08/24/20 0945 apixaban (ELIQUIS) tablet 5 mg    Subjective: No major issues overnight-Per daughter-she is eating around 40% of her meals along with Ensure in between meals.  NG tube/rectal tube discontinued.  Daughter thinks that when she goes home-she will eat more  Assessment/Plan: Acute on chronic hypoxic respiratory failure due to left mainstem mucous plugging/HCAP: Continue pulmonary toileting-mobilize as much as possible-has completed a course of antimicrobial therapy.  Culture data as above.  Remains stable on 2-3 L of oxygen-which is her baseline.  Acute metabolic encephalopathy: Multifactorial etiology-due to ICU delirium/underlying dementia/pneumonia.  Her mentation has improved-continue to minimize sedatives/narcotics.  Work-up as above-neurology has signed off.    Acute CVA: Seen on MRI on 2/25-incidental-likely clinically insignificant.  Work-up as above-already on full dose anticoagulation.  Dysphagia: Tolerating dysphagia 2 diet-per daughter-she is consuming around 40% of her meals. NG tube discontinued on 3/18.  Normocytic anemia: Due to acute illness-no evidence of blood loss.  Transfuse if hemoglobin <7  Dementia: Following simple commands-continue Aricept  Seizure disorder: On Keppra-EEG/MRI brain as above.  Chronic atrial fibrillation: Continue amiodarone-anticoagulated with Eliquis  Chronic diastolic heart failure: Euvolemic on exam-continue Lasix  HTN: Controlled-continue metoprolol-follow and adjust.  HLD: On  statin  Hypothyroidism: Continue Synthroid  IDDM-2 (A1c 7.9 on 2/21): Continues to have hypoglycemic episodes-likely due  to no longer being on tube feeds-and hence decrease insulin requirements-decrease Lantus to 10 units-continue extra sensitive SSI-follow and adjust.   Recent Labs    09/19/20 1802 09/19/20 1830 09/19/20 2129  GLUCAP 50* 73 86   Chronic pain: Has mostly back pain-stable with Lidoderm patch-have restarted low-dose Neurontin-does not seem sedated-pain has improved with these measures.  Need to be very cautious about sedating medications given potential for aspiration.  Severe debility/deconditioning/functional quadriplegia: Continue PT/OT eval. daughter refuses SNF-wants to take her home-have asked case management to assist with DME requirements.  Home health services ordered.  Obesity: Estimated body mass index is 35.65 kg/m as calculated from the following:   Height as of this encounter: 5' 6" (1.676 m).   Weight as of this encounter: 100.2 kg.     Diet: Diet Order            DIET DYS 2 Room service appropriate? No; Fluid consistency: Thin  Diet effective now                  Code Status: Full code   Family Communication: Daughter at bedside  Disposition Plan: Status is: Inpatient  Remains inpatient appropriate because:Inpatient level of care appropriate due to severity of illness   Dispo: The patient is from: Home              Anticipated d/c is to: Home              Patient currently is not medically stable to d/c.   Difficult to place patient No     Barriers to Discharge: Hypoglycemia-insulin regimen adjusted-if CBGs stabilize-possible discharge on 3/21.  Antimicrobial agents: Anti-infectives (From admission, onward)   Start     Dose/Rate Route Frequency Ordered Stop   08/28/20 1900  vancomycin (VANCOREADY) IVPB 1250 mg/250 mL  Status:  Discontinued        1,250 mg 166.7 mL/hr over 90 Minutes Intravenous Every 24 hours 08/27/20 1820  08/28/20 1204   08/27/20 2200  piperacillin-tazobactam (ZOSYN) IVPB 3.375 g        3.375 g 12.5 mL/hr over 240 Minutes Intravenous Every 8 hours 08/27/20 1412 09/03/20 0141   08/27/20 1915  vancomycin (VANCOREADY) IVPB 2000 mg/400 mL        2,000 mg 200 mL/hr over 120 Minutes Intravenous  Once 08/27/20 1816 08/27/20 2156   08/27/20 1430  piperacillin-tazobactam (ZOSYN) IVPB 3.375 g        3.375 g 100 mL/hr over 30 Minutes Intravenous  Once 08/27/20 1412 08/27/20 1502   08/27/20 1000  ceFEPIme (MAXIPIME) 2 g in sodium chloride 0.9 % 100 mL IVPB  Status:  Discontinued        2 g 200 mL/hr over 30 Minutes Intravenous Every 12 hours 08/27/20 0744 08/27/20 1412   08/26/20 1000  ceFEPIme (MAXIPIME) 2 g in sodium chloride 0.9 % 100 mL IVPB  Status:  Discontinued        2 g 200 mL/hr over 30 Minutes Intravenous Every 8 hours 08/26/20 0848 08/27/20 0744   08/25/20 1200  vancomycin (VANCOCIN) IVPB 1000 mg/200 mL premix  Status:  Discontinued        1,000 mg 200 mL/hr over 60 Minutes Intravenous Every 24 hours 08/24/20 1043 08/24/20 1452   08/24/20 1200  vancomycin (VANCOREADY) IVPB 2000 mg/400 mL  Status:  Discontinued        2,000 mg 200 mL/hr over 120 Minutes Intravenous  Once 08/24/20 1009 08/24/20 1452   08/24/20 1045  ceFEPIme (MAXIPIME) 2 g in sodium chloride 0.9 % 100 mL IVPB  Status:  Discontinued        2 g 200 mL/hr over 30 Minutes Intravenous Every 12 hours 08/24/20 0958 08/26/20 0848   08/24/20 0100  ceFEPIme (MAXIPIME) 2 g in sodium chloride 0.9 % 100 mL IVPB        2 g 200 mL/hr over 30 Minutes Intravenous  Once 08/24/20 0047 08/24/20 0219       Time spent: 25 minutes-Greater than 50% of this time was spent in counseling, explanation of diagnosis, planning of further management, and coordination of care.  MEDICATIONS: Scheduled Meds: . amiodarone  200 mg Per Tube Daily  . apixaban  5 mg Per Tube BID  . aspirin  81 mg Per Tube Daily  . atorvastatin  80 mg Per Tube Daily  .  chlorhexidine  15 mL Mouth Rinse BID  . Chlorhexidine Gluconate Cloth  6 each Topical Daily  . donepezil  5 mg Per Tube QHS  . DULoxetine  30 mg Oral QHS  . feeding supplement  237 mL Oral TID BM  . furosemide  20 mg Oral Daily  . gabapentin  100 mg Oral TID  . guaiFENesin  5 mL Per Tube Q6H  . insulin aspart  0-6 Units Subcutaneous TID WC  . insulin glargine  10 Units Subcutaneous Daily  . levETIRAcetam  1,000 mg Per Tube BID  . levothyroxine  175 mcg Per Tube Q0600  . lidocaine  1 patch Transdermal Q24H  . mouth rinse  15 mL Mouth Rinse q12n4p  . metoprolol tartrate  12.5 mg Per Tube BID   Continuous Infusions: . sodium chloride Stopped (09/06/20 1228)   PRN Meds:.sodium chloride, acetaminophen **OR** acetaminophen, hydrALAZINE, ipratropium-albuterol, labetalol, loperamide HCl, melatonin, naLOXone (NARCAN)  injection, ondansetron **OR** ondansetron (ZOFRAN) IV   PHYSICAL EXAM: Vital signs: Vitals:   09/20/20 0005 09/20/20 0414 09/20/20 0759 09/20/20 0900  BP: 109/76 110/60 98/68 102/68  Pulse: 85 70  81  Resp: 15 18    Temp: 98 F (36.7 C) 98.7 F (37.1 C) 98.3 F (36.8 C)   TempSrc: Oral Axillary Oral   SpO2: 100% 99%    Weight:  100.2 kg    Height:       Filed Weights   09/18/20 0500 09/19/20 0401 09/20/20 0414  Weight: 99.7 kg 100.6 kg 100.2 kg   Body mass index is 35.65 kg/m.   Gen Exam:Alert awake-not in any distress HEENT:atraumatic, normocephalic Chest: B/L clear to auscultation anteriorly CVS:S1S2 regular Abdomen:soft non tender, non distended Extremities:no edema Neurology: Non focal--but with severe generalized weakness. Skin: no rash  I have personally reviewed following labs and imaging studies  LABORATORY DATA: CBC: Recent Labs  Lab 09/14/20 0124 09/17/20 0207  WBC  --  8.6  HGB 9.4* 9.6*  HCT 30.9* 31.2*  MCV  --  105.4*  PLT  --  322    Basic Metabolic Panel: Recent Labs  Lab 09/14/20 0124 09/17/20 0207 09/19/20 0231  09/20/20 0231  NA 140 137 137 137  K 4.6 4.1 3.8 3.8  CL 98 98 98 97*  CO2 36* 31 32 32  GLUCOSE 185* 214* 101* 95  BUN 24* 28* 26* 22  CREATININE 0.94 1.00 0.94 1.02*  CALCIUM 9.1 9.0 8.9 8.5*    GFR: Estimated Creatinine Clearance: 60.5 mL/min (A) (by C-G formula based on SCr of 1.02 mg/dL (H)).  Liver Function Tests: No results for input(s): AST, ALT, ALKPHOS, BILITOT,  PROT, ALBUMIN in the last 168 hours. No results for input(s): LIPASE, AMYLASE in the last 168 hours. No results for input(s): AMMONIA in the last 168 hours.  Coagulation Profile: No results for input(s): INR, PROTIME in the last 168 hours.  Cardiac Enzymes: No results for input(s): CKTOTAL, CKMB, CKMBINDEX, TROPONINI in the last 168 hours.  BNP (last 3 results) No results for input(s): PROBNP in the last 8760 hours.  Lipid Profile: No results for input(s): CHOL, HDL, LDLCALC, TRIG, CHOLHDL, LDLDIRECT in the last 72 hours.  Thyroid Function Tests: No results for input(s): TSH, T4TOTAL, FREET4, T3FREE, THYROIDAB in the last 72 hours.  Anemia Panel: No results for input(s): VITAMINB12, FOLATE, FERRITIN, TIBC, IRON, RETICCTPCT in the last 72 hours.  Urine analysis:    Component Value Date/Time   COLORURINE YELLOW 09/08/2020 1006   APPEARANCEUR HAZY (A) 09/08/2020 1006   LABSPEC 1.023 09/08/2020 1006   PHURINE 5.0 09/08/2020 1006   GLUCOSEU NEGATIVE 09/08/2020 1006   HGBUR NEGATIVE 09/08/2020 1006   BILIRUBINUR NEGATIVE 09/08/2020 1006   KETONESUR NEGATIVE 09/08/2020 1006   PROTEINUR 30 (A) 09/08/2020 1006   UROBILINOGEN 0.2 02/05/2015 1700   NITRITE NEGATIVE 09/08/2020 1006   LEUKOCYTESUR NEGATIVE 09/08/2020 1006    Sepsis Labs: Lactic Acid, Venous    Component Value Date/Time   LATICACIDVEN 1.2 08/23/2020 2347    MICROBIOLOGY: No results found for this or any previous visit (from the past 240 hour(s)).  RADIOLOGY STUDIES/RESULTS: DG Chest Port 1 View  Result Date: 09/20/2020 CLINICAL  DATA:  Shortness of breath. EXAM: PORTABLE CHEST 1 VIEW COMPARISON:  Prior chest radiograph 09/11/2020 and earlier. FINDINGS: Unchanged cardiomegaly. Aeration of the right lung has significantly improved as compared to the prior examination of 09/11/2020 with persistent prominence of the interstitial markings within the right lung and persistent airspace opacities within the medial right lung base. Persistent interstitial prominence and patchy airspace opacities throughout much of the left lung. No sizable pleural effusion. No evidence of pneumothorax IMPRESSION: Unchanged cardiomegaly. Improved aeration of the right lung as compared to 09/11/2020. Persistent prominence of the interstitial lung markings bilaterally with persistent airspace opacities within the medial right lung base and scattered throughout much of the left lung. Findings likely reflect pulmonary edema, possibly with superimposed multifocal pneumonia. Electronically Signed   By: Kellie Simmering DO   On: 09/20/2020 07:56     LOS: 45 days   Oren Binet, MD  [facility]    To contact the attending provider between 7A-7P or the covering provider during after hours 7P-7A, please log into the web site [email or web address] and access using universal [place] password for that web site. If you do not have the password, please call the hospital operator.  09/20/2020, 11:41 AM

## 2020-09-21 MED ORDER — METOPROLOL TARTRATE 12.5 MG HALF TABLET
12.5000 mg | ORAL_TABLET | Freq: Two times a day (BID) | ORAL | Status: DC
  Administered 2020-09-21 – 2020-09-22 (×3): 12.5 mg via ORAL
  Filled 2020-09-21 (×3): qty 1

## 2020-09-21 MED ORDER — INSULIN GLARGINE 100 UNIT/ML ~~LOC~~ SOLN: 10.0000 [IU] | Freq: Every day | SUBCUTANEOUS | 11 refills | Status: DC

## 2020-09-21 MED ORDER — ATORVASTATIN CALCIUM 80 MG PO TABS: 80.0000 mg | ORAL_TABLET | Freq: Every day | ORAL | 3 refills | Status: DC

## 2020-09-21 MED ORDER — INSULIN GLARGINE 100 UNIT/ML ~~LOC~~ SOLN: 6.0000 [IU] | Freq: Every day | SUBCUTANEOUS | Status: DC

## 2020-09-21 MED ORDER — FUROSEMIDE 20 MG PO TABS: 20.0000 mg | ORAL_TABLET | Freq: Every day | ORAL | 3 refills | Status: DC

## 2020-09-21 MED ORDER — ASPIRIN 81 MG PO CHEW: 81.0000 mg | CHEWABLE_TABLET | Freq: Every day | ORAL | 12 refills | Status: DC

## 2020-09-21 MED ORDER — LEVETIRACETAM 500 MG PO TABS: 1500.0000 mg | ORAL_TABLET | Freq: Two times a day (BID) | ORAL | 3 refills | Status: DC

## 2020-09-21 MED ORDER — INSULIN GLARGINE 100 UNIT/ML ~~LOC~~ SOLN
10.0000 [IU] | Freq: Every day | SUBCUTANEOUS | Status: DC
  Administered 2020-09-22: 10 [IU] via SUBCUTANEOUS
  Filled 2020-09-21 (×3): qty 0.1

## 2020-09-21 MED ORDER — ENSURE ENLIVE PO LIQD: 237.0000 mL | Freq: Three times a day (TID) | ORAL | 12 refills | Status: DC

## 2020-09-21 MED ORDER — LEVETIRACETAM 500 MG PO TABS: 1000.0000 mg | ORAL_TABLET | Freq: Two times a day (BID) | ORAL | 3 refills | Status: DC

## 2020-09-21 MED ORDER — METOPROLOL TARTRATE 25 MG PO TABS: 12.5000 mg | ORAL_TABLET | Freq: Two times a day (BID) | ORAL | 3 refills | Status: DC

## 2020-09-21 MED ORDER — GABAPENTIN 100 MG PO CAPS: 100.0000 mg | ORAL_CAPSULE | Freq: Three times a day (TID) | ORAL | 3 refills | Status: DC

## 2020-09-21 NOTE — TOC Progression Note (Signed)
Transition of Care [facility]) - Progression Note    Patient Details  Name: [name] MRN: [id number] Date of Birth: 09/05/1950  Transition of Care [facility]) CM/SW Contact  Tara Bridgeman, RN Phone Number: 09/21/2020, 11:53 AM  Clinical Narrative:    Case management spoke with the patient's daughter this morning regarding transitions of care.  [facility] does not have any available dme hospital beds available.  I called and spoke with the daughter, [name] - and she is asking to receive dme from [place].  I spoke with [name], CM at [facility] and they are able to provide the patient with a hospital bed, hoyer lift, and light weight wheelchair.  I faxed clinicals to [facility] and they will be able to deliver the dme to the patient's home tomorrow.  The patient will be provided with ambulance transportation to home tomorrow after hospital bed arrives to the home in the early am.  CM reached out to [facility], [facility], [facility], [facility], [facility] and [facility] and they are unable to provide staffing in the [place] area.  I called [facility] and they are checking on availability of home health services.  CM will continue to follow the patient for discharge to home tomorrow once the hospital bed and dme provides.   Expected Discharge Plan: Home w Home Health Services Barriers to Discharge: Continued Medical Work up  Expected Discharge Plan and Services Expected Discharge Plan: Home w Home Health Services In-house Referral: Clinical Social Work Discharge Planning Services: CM Consult Post Acute Care Choice: Durable Medical Equipment,Home Health Living arrangements for the past 2 months: Single Family Home Expected Discharge Date: 09/21/20               DME Arranged: Hospital bed,Wheelchair manual Michiel Sites lift) DME Agency: [facility] ([facility] will be providing dme for home.) Date DME Agency Contacted: 09/21/20 Time DME  Agency Contacted: 0800 Representative spoke with at DME Agency: [name], CM at [facility]   [facility] Agency: [facility]     Representative spoke with at [facility] Agency: CM is reaching out to multiple home health agencies to provide home health services   Social Determinants of Health (SDOH) Interventions    Readmission Risk Interventions Readmission Risk Prevention Plan 09/21/2020 09/16/2020  Transportation Screening Complete Complete  Medication Review Oceanographer) Complete Complete  PCP or Specialist appointment within 3-5 days of discharge Complete Complete  HRI or Home Care Consult Complete Complete  SW Recovery Care/Counseling Consult Complete Complete  Palliative Care Screening Complete Complete  Skilled Nursing Facility Complete Complete  Some recent data might be hidden

## 2020-09-21 NOTE — Care Management (Signed)
    Durable Medical Equipment  (From admission, onward)         Start     Ordered   09/21/20 0926  For home use only DME lightweight manual wheelchair with seat cushion  Once       Comments: Patient suffers from S/P septic shock and pneumonia, generalized weakness which impairs their ability to perform daily activities like ADLs:[id number] in the home.  A walking [id number] will not resolve  issue with performing activities of daily living. A wheelchair will allow patient to safely perform daily activities. Patient is not able to propel themselves in the home using a standard weight wheelchair due to weakness:[id number]. Patient can self propel in the lightweight wheelchair. Length of need 12 months. Accessories: elevating leg rests (ELRs), wheel locks, extensions and anti-tippers.   09/21/20 5643   09/20/20 1202  For home use only DME Hospital bed  Once       Question Answer Comment  Length of Need Lifetime   Patient has (list medical condition): debility, weakness, swallowing impairment   The above medical condition requires: Patient requires the ability to reposition frequently   Head must be elevated greater than: 30 degrees   Bed type Semi-electric   Hoyer Lift Yes   Support Surface: Low Air loss Mattress      09/20/20 1202   09/20/20 1202  For home use only DME Other see comment  Once       Comments: Bed side table for hospital bed  Question:  Length of Need  Answer:  Lifetime   09/20/20 1202

## 2020-09-21 NOTE — Progress Notes (Signed)
[facility] PROGRESS NOTE  [name] IFO:[id number] DOB: 1951/01/31 DOA: 08/23/2020 PCP: Lemmie Evens, MD  Status:  Remains inpatient appropriate because:Altered mental status, Unsafe d/c plan, IV treatments appropriate due to intensity of illness or inability to take PO and Inpatient level of care appropriate due to severity of illness   Dispo: The patient is from: Home               Anticipated d/c is to: Home 3/22-DC delayed due to inability to obatin hospital bed for hme use prior to dc; clarification regarding home health services.  Unable to obtain home health services through previously stated provider but fortunately we were able to arrange home health services for an aide, PT and OT during [facility] health              Patient currently is medically stable to d/c.   Difficult to place patient Yes    Level of care: Progressive  Code Status: Full Family Communication: Daughter at bedside DVT prophylaxis: Eliquis Vaccination status: Unknown  Foley catheter: Purewick catheter   HPI: 69 y/ofemale admitted to [facility] on 08/23/20 in the setting of septic shock from pneumonia requiring intubation,She had atrial fibrillation at baseline and had been on amiodarone,this was held for concerns of possible amiodarone induced lung toxicity and she was treated with steroids. She wasextubated on 08/28/20.However,her mental status declined overnight from 2/26 through early 2/27. A blood gas was obtained which did not show hypercarbia. However after the blood gas was performed patient has worsening of hypoxia and mental status. In consultation with the pulmonary critical care service in [place] it was decided that she should be electively intubated and then transferred to [facility] for  bronchoscopy. She is s/p bronchoscopy for mucus plugging, she finished abx treatment, then extubated on 3/4, transferred to hospitalist service on 3/6.    Significant Hospital Events:   2/20 Admitted to [facility], intubated 2/22 Stop amiodarone (esr 95) 2/23 Solumedrol trial @ 80 q 12 and reduced to 40 mg q 12h Pm 2/25  2/25 Extubated 2/26 Worsening mental status/respiratory failure 2/27 Transferred to [facility], reintubated, bronched 2/28 Decreasing vent settings 3/01 Failed vent wean 2/2 increased HR/RR 3/4   Extubated , bipap q hs    Subjective: Patient more alert today.  Very interactive.  Noted to be able to turn self in bed.  Eager to discharge home.  Objective: Vitals:   09/21/20 0500 09/21/20 0743  BP: (!) 90/56 106/77  Pulse: 62 72  Resp: 18 16  Temp: 98.3 F (36.8 C) 98.4 F (36.9 C)  SpO2: 96% 99%    Intake/Output Summary (Last 24 hours) at 09/21/2020 0838 Last data filed at 09/21/2020 0145 Gross per 24 hour  Intake --  Output 400 ml  Net -400 ml   Filed Weights   09/19/20 0401 09/20/20 0414 09/21/20 0500  Weight: 100.6 kg 100.2 kg 97.8 kg    Exam:  Constitutional: Alert, no acute distress, calm Cardiac: Heart sounds are S1-S2, soft nonpitting edema both legs, pulse regular nontachycardic. Lungs: Lungs are clear to auscultation and she is stable on 2 L nasal cannula oxygen Abdomen: Soft nontender.  Tolerating oral diet Neurologic: CN 2-12 grossly intact. Sensation intact, DTR normal. Strength 1-2/5 x all 4 extremities.  Psychiatric: Alert and oriented x3.  Appropriate affect.   Assessment/Plan: Acute problems:  Acute metabolic encephalopathy/resolved -Daughter reports at baseline patient is alert and independent -CT head no acute finding, EEG : No evidence of seizures.   -  3/8 repeat MRI unremarkable -Ammonia improved. Continue Lactulose. -Continue Keppra for seizure prophylaxis  Acute hyponatremia -Resolved: sodium level. 150 >149 > 145 >139  Acute hypoxic respiratory failure due to left mainstem mucous pluging Recent healthcare associated pneumonia:  She has recently finished course of antibiotics Zosyn on 3/2. S/p bronchoscopy on  2/27 with total occlusion of L mainstem with mucous plug, sent for Cx.  Chronic A. Fib/acquired thrombophilia: -Heart rate remains controlled,amiodarone been resumed, no evidence of fibrosis on CT scan. -Continue apixaban  Dysphagia/obesity -Secondary to persistent metabolic encephalopathy Primary nutrition per tube but is allowed small amounts of pured diet as mentation allows Nutrition Problem: Inadequate oral intake Etiology: dysphagia now tolerating D2 diet with Enlive protein supplementation Signs/Symptoms: meal completion < 50% Estimated body mass index is 34.8 kg/m as calculated from the following:   Height as of this encounter: _0  (1.676 m).   Weight as of this encounter: 97.8 kg.  Chronic diastolic CHF -Echo 4/09 with EF of 50-55%. -Remains euvolemic -Continue Lasix 40 mg daily and metoprolol 12.5 mg twice daily. -Not on ACE or ARB 2/2 chronic kidney disease  Insulin-dependent type 2 diabetes -Uncontrolled, A1c 7.9 -Continue Lantus from 10 units daily -Continue SSI until discharge     Other problems: CKD 2/anemia of chronic disease Renal function and hemoglobin appear close to baseline. Monitor  Renal functions  History of seizures Continue Keppra 1000 mg twice daily through tube. EEG completed, no evidence of seizures.  Hypothyroidism Continue Synthroid   Dementia Continue Aricept. Continue Cymbalta.  Hypokalemia : > Improved. -Replace K as needed.  Hypernatremia > Improved. Na 150> 149> 145 >139 > 140 Continue free water flush through core track tube if not able to take PO   Data Reviewed: Basic Metabolic Panel: Recent Labs  Lab 09/17/20 0207 09/19/20 0231 09/20/20 0231  NA 137 137 137  K 4.1 3.8 3.8  CL 98 98 97*  CO2 31 32 32  GLUCOSE 214* 101* 95  BUN 28* 26* 22  CREATININE 1.00 0.94 1.02*  CALCIUM 9.0 8.9 8.5*   Liver Function Tests: No results for input(s): AST, ALT, ALKPHOS, BILITOT, PROT, ALBUMIN in the last 168  hours. No results for input(s): LIPASE, AMYLASE in the last 168 hours. No results for input(s): AMMONIA in the last 168 hours. CBC: Recent Labs  Lab 09/17/20 0207  WBC 8.6  HGB 9.6*  HCT 31.2*  MCV 105.4*  PLT 183   Cardiac Enzymes: No results for input(s): CKTOTAL, CKMB, CKMBINDEX, TROPONINI in the last 168 hours. BNP (last 3 results) Recent Labs    11/16/19 1103  BNP 263.0*    ProBNP (last 3 results) No results for input(s): PROBNP in the last 8760 hours.  CBG: Recent Labs  Lab 09/19/20 1234 09/19/20 1733 09/19/20 1802 09/19/20 1830 09/19/20 2129  GLUCAP 91 44* 50* 73 86    No results found for this or any previous visit (from the past 240 hour(s)).   Studies: DG Chest Port 1 View  Result Date: 09/20/2020 CLINICAL DATA:  Shortness of breath. EXAM: PORTABLE CHEST 1 VIEW COMPARISON:  Prior chest radiograph 09/11/2020 and earlier. FINDINGS: Unchanged cardiomegaly. Aeration of the right lung has significantly improved as compared to the prior examination of 09/11/2020 with persistent prominence of the interstitial markings within the right lung and persistent airspace opacities within the medial right lung base. Persistent interstitial prominence and patchy airspace opacities throughout much of the left lung. No sizable pleural effusion. No evidence of pneumothorax IMPRESSION: Unchanged  cardiomegaly. Improved aeration of the right lung as compared to 09/11/2020. Persistent prominence of the interstitial lung markings bilaterally with persistent airspace opacities within the medial right lung base and scattered throughout much of the left lung. Findings likely reflect pulmonary edema, possibly with superimposed multifocal pneumonia. Electronically Signed   By: Kellie Simmering DO   On: 09/20/2020 07:56    Scheduled Meds: . amiodarone  200 mg Per Tube Daily  . apixaban  5 mg Per Tube BID  . aspirin  81 mg Per Tube Daily  . atorvastatin  80 mg Per Tube Daily  . chlorhexidine   15 mL Mouth Rinse BID  . Chlorhexidine Gluconate Cloth  6 each Topical Daily  . donepezil  5 mg Per Tube QHS  . DULoxetine  30 mg Oral QHS  . feeding supplement  237 mL Oral TID BM  . furosemide  20 mg Oral Daily  . gabapentin  100 mg Oral TID  . guaiFENesin  5 mL Per Tube Q6H  . insulin aspart  0-6 Units Subcutaneous TID WC  . insulin glargine  10 Units Subcutaneous Daily  . levETIRAcetam  1,000 mg Per Tube BID  . levothyroxine  175 mcg Per Tube Q0600  . lidocaine  1 patch Transdermal Q24H  . mouth rinse  15 mL Mouth Rinse q12n4p  . metoprolol tartrate  12.5 mg Per Tube BID   Continuous Infusions: . sodium chloride Stopped (09/06/20 1228)    Principal Problem:   Lobar pneumonia (HCC) Active Problems:   Hypothyroidism   Hyperlipidemia   Essential hypertension   Atrial fibrillation (HCC)   Congestive heart failure (HCC)   Gastroparesis   Seizure (HCC)   Dementia (HCC)   DM type 2 (diabetes mellitus, type 2) ([place])   Acute respiratory failure ([place])   Respiratory failure with hypoxia ([place])   Fibromyalgia   Septic shock ([place])   Acute ischemic stroke ([place])   Acute metabolic encephalopathy   Consultants:  PCCM  Neurology  Procedures: ETT 2/20 >>2/25, 2/27 >>3/4, tube feeding while on vent R IJ CVL 2/21 >>  Antibiotics: Anti-infectives (From admission, onward)   Start     Dose/Rate Route Frequency Ordered Stop   08/28/20 1900  vancomycin (VANCOREADY) IVPB 1250 mg/250 mL  Status:  Discontinued        1,250 mg 166.7 mL/hr over 90 Minutes Intravenous Every 24 hours 08/27/20 1820 08/28/20 1204   08/27/20 2200  piperacillin-tazobactam (ZOSYN) IVPB 3.375 g        3.375 g 12.5 mL/hr over 240 Minutes Intravenous Every 8 hours 08/27/20 1412 09/03/20 0141   08/27/20 1915  vancomycin (VANCOREADY) IVPB 2000 mg/400 mL        2,000 mg 200 mL/hr over 120 Minutes Intravenous  Once 08/27/20 1816 08/27/20 2156   08/27/20 1430  piperacillin-tazobactam (ZOSYN) IVPB 3.375 g         3.375 g 100 mL/hr over 30 Minutes Intravenous  Once 08/27/20 1412 08/27/20 1502   08/27/20 1000  ceFEPIme (MAXIPIME) 2 g in sodium chloride 0.9 % 100 mL IVPB  Status:  Discontinued        2 g 200 mL/hr over 30 Minutes Intravenous Every 12 hours 08/27/20 0744 08/27/20 1412   08/26/20 1000  ceFEPIme (MAXIPIME) 2 g in sodium chloride 0.9 % 100 mL IVPB  Status:  Discontinued        2 g 200 mL/hr over 30 Minutes Intravenous Every 8 hours 08/26/20 0848 08/27/20 0744   08/25/20 1200  vancomycin (VANCOCIN) IVPB 1000 mg/200 mL premix  Status:  Discontinued        1,000 mg 200 mL/hr over 60 Minutes Intravenous Every 24 hours 08/24/20 1043 08/24/20 1452   08/24/20 1200  vancomycin (VANCOREADY) IVPB 2000 mg/400 mL  Status:  Discontinued        2,000 mg 200 mL/hr over 120 Minutes Intravenous  Once 08/24/20 1009 08/24/20 1452   08/24/20 1045  ceFEPIme (MAXIPIME) 2 g in sodium chloride 0.9 % 100 mL IVPB  Status:  Discontinued        2 g 200 mL/hr over 30 Minutes Intravenous Every 12 hours 08/24/20 0958 08/26/20 0848   08/24/20 0100  ceFEPIme (MAXIPIME) 2 g in sodium chloride 0.9 % 100 mL IVPB        2 g 200 mL/hr over 30 Minutes Intravenous  Once 08/24/20 0047 08/24/20 0219       Time spent: 35 minutes    Erin Hearing ANP  [facility] 7 am - 330 pm/M-F for direct patient care and secure chat Please refer to Amion for contact info 28  days

## 2020-09-21 NOTE — Plan of Care (Signed)
Patient is currently resting in bed. C/o pain, given tylenol PRN. Repositioned. Tolerating diet. VSS. No complaints overnight. Call bell within reach. Bed alarm. Daughter at bedside.   Problem: Activity: Goal: Ability to tolerate increased activity will improve Outcome: Progressing   Problem: Clinical Measurements: Goal: Ability to maintain a body temperature in the normal range will improve Outcome: Progressing   Problem: Respiratory: Goal: Ability to maintain adequate ventilation will improve Outcome: Progressing Goal: Ability to maintain a clear airway will improve Outcome: Progressing

## 2020-09-21 NOTE — Discharge Summary (Signed)
Physician Discharge Summary  HALEN [name] ZDG:[id number] DOB: [birth year] DOA: 08/23/2020  PCP: Gareth Morgan, MD  Admit date: 08/23/2020 Discharge date: 09/21/2020  Time spent: 35 minutes  Recommendations for Outpatient Follow-up:  1. Please follow-up with your primary care physician in 2 weeks after discharge 2. Please call Dr. Wyline Mood with cardiology to schedule follow-up appointment within 2 to 4 weeks after discharge. 3. During this hospitalization her Lipitor dose was increased, her gabapentin dose was decreased, her Lantus dose was decreased and her Keppra dose was changed as well.  She was changed to Lasix from torsemide 4. Preadmission dicyclomine, glipizide, hydralazine, Norco, Jardiance, lorazepam, Metformin and torsemide were discontinued. 5. Patient will be receiving home health through [facility].  Her DME will be provided by [facility] as well.   Discharge Diagnoses:  Principal Problem:   Lobar pneumonia (HCC) Active Problems:   Hypothyroidism   Hyperlipidemia   Essential hypertension   Atrial fibrillation (HCC)   Congestive heart failure (HCC)   Gastroparesis   Seizure (HCC)   Dementia (HCC)   DM type 2 (diabetes mellitus, type 2) (HCC)   Acute respiratory failure (HCC)   Respiratory failure with hypoxia (HCC)   Fibromyalgia   Septic shock (HCC)   Acute ischemic stroke (HCC)   Acute metabolic encephalopathy  SEPSIS RESOLVED  Discharge Condition: Stable  Diet recommendation: Regular.  Once appetite significantly improved and if CBGs are increasing recommend transitioning back to carbohydrate modified diet.  Filed Weights   09/19/20 0401 09/20/20 0414 09/21/20 0500  Weight: 100.6 kg 100.2 kg 97.8 kg    History of present illness:  70 y/ofemale admitted to [facility] on 08/23/20 in the setting of septic shock from pneumonia requiring intubation,She had atrial fibrillation at baseline and had been on amiodarone,this was held for  concerns of possible amiodarone induced lung toxicity and she was treated with steroids. She wasextubated on 08/28/20.However,her mental status declined overnight from 2/26 through early 2/27. A blood gas was obtained which did not show hypercarbia. However after the blood gas was performed patient has worsening of hypoxia and mental status. After a telephonic consultation with pulmonary critical care service in [place] it was decided that she should be electively intubated and then transferred to [facility] for bronchoscopy.She is s/p bronchoscopy for mucus plugging, she finished abx treatment, then extubated on 3/4, transferred to hospitalist service on 3/6.  Hospital Course:  Acute metabolic encephalopathy/resolving -Daughter reports at baseline patient is alert and independent -CT head no acute finding, EEG : No evidence of seizures.   -3/8 repeat MRI unremarkable -Use caution with potentially sedating meds in the outpatient setting -Ammonia improved after transient use of lactulose -Continue Keppra for seizure prophylaxis  Acute hyponatremia -Resolved: sodium level. 150 >149 > 145 >139  Acute hypoxic respiratory failure due to left mainstem mucous pluging Recent healthcare associated pneumonia:  She has recently finished course of antibiotics Zosyn on 3/2. S/p bronchoscopy on 2/27 with total occlusion of L mainstem with mucous plug, sent for Cx. -Continue home O2 after discharge.  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  Chronic A. Fib/acquired thrombophilia: -Heart rate remains controlled -Continue amiodarone and metoprolol after discharge -Continue apixaban after discharge  Dysphagia/obesity Nutrition Problem: Inadequate oral intake Etiology: dysphagia.continue D2 diet with Enlive protein or other over-the-counter supplementation such as Ensure or boost milk Signs/Symptoms: meal completion < 50% Estimated body mass index is 34.8 kg/m as calculated from the  following:   Height as of this encounter: 5'  6" (1.676 m).   Weight as of this encounter: 97.8 kg.  Chronic diastolic CHF -Echo 2/26 with EF of 50-55%. -Remains euvolemic -Continue Lasix 40 mg daily and metoprolol 12.5 mg twice daily. -Not on ACE or ARB 2/2 chronic kidney disease  Insulin-dependent type 2 diabetes -Uncontrolled, A1c 7.9 -Continue Lantus from 10 units daily     Other problems: CKD 2/anemia of chronic disease Renal function and hemoglobin appear close to baseline. Monitor Renal functions  History of seizures Continue Keppra 1000 mg twice daily through tube. EEG completed, no evidence of seizures.  Hypothyroidism Continue Synthroid   Dementia Continue Aricept. Continue Cymbalta.  Hypokalemia : > Improved. -Replace K as needed.    Procedures:  PCCM  Neurology   Consultations: ETT 2/20 >>2/25, 2/27 >>3/4, tube feeding while on vent R IJ CVL 2/21 >> Also had cortrack tube which was discontinued on 3/18  Discharge Exam: Vitals:   09/21/20 0743 09/21/20 1209  BP: 106/77 (!) 91/58  Pulse: 72 66  Resp: 16 18  Temp: 98.4 F (36.9 C) 97.6 F (36.4 C)  SpO2: 99% 99%   Constitutional:  Awake, remains lethargic but much more interactive as compared to my initial encounter with her 72 week prior Cardiac: S1-S2, no JVD, soft peripheral edema that is nonpitting Lungs: Lungs are clear, stable on home O2 at 2 L/min Abdomen:  Soft bowel sounds present.  Variable oral intake. LBM 3/20 Neurologic: CN 2-12 grossly intact. Sensation intact, DTR normal. Strength 1-2/5 x all 4 extremities.  Psychiatric: A and O x 3.  Discharge Instructions   Discharge Instructions    (HEART FAILURE PATIENTS) Call MD:  Anytime you have any of the following symptoms: 1) 3 pound weight gain in 24 hours or 5 pounds in 1 week 2) shortness of breath, with or without a dry hacking cough 3) swelling in the hands, feet or stomach 4) if you have to sleep on extra  pillows at night in order to breathe.   Complete by: As directed    Call MD for:  difficulty breathing, headache or visual disturbances   Complete by: As directed    Call MD for:  extreme fatigue   Complete by: As directed    Call MD for:  persistant dizziness or light-headedness   Complete by: As directed    Call MD for:  persistant nausea and vomiting   Complete by: As directed    Call MD for:  severe uncontrolled pain   Complete by: As directed    Call MD for:  temperature >100.4   Complete by: As directed    Diet general   Complete by: As directed    At present we are encouraging a regular diet to ensure adequate oral intake.  If eating well and blood glucoses rise will need to transition back to a carbohydrate modified diet.   Discharge instructions   Complete by: As directed    Please follow-up with your primary care physician in 2 weeks after discharge  Please call Dr. Wyline Mood with cardiology to arrange for follow-up appointment in 2 to 4 weeks after discharge  M0 and you will be receiving home health services through [facility] with nurse visits, aide, PT and OT.  Please continue over-the-counter protein supplements such as Ensure or boost 2-3 times a day  You will be receiving the following durable medical equipment: Hospital bed, lightweight wheelchair and Hoyer lift to assist in transitioning from bed to chair.   Increase activity slowly  Complete by: As directed    No wound care   Complete by: As directed      Allergies as of 09/21/2020      Reactions   Citalopram Hydrobromide Other (See Comments)   Dyskinesia Other reaction(s): Other (See Comments) Dyskinesia   Codeine Nausea And Vomiting, Other (See Comments)   HALLUCINATIONS Other reaction(s): Unknown Other reaction(s): Other (See Comments) HALLUCINATIONS   Hydromorphone Hcl Other (See Comments)   Made her pass out Other reaction(s): Other (See Comments) Made her pass out   Metoclopramide Other  (See Comments)   DYSKINESIA Other reaction(s): Other (See Comments) DYSKINESIA   Hyoscyamine    MADE DIARRHEA WORSE      Medication List    STOP taking these medications   dicyclomine 20 MG tablet Commonly known as: BENTYL   glipiZIDE 10 MG tablet Commonly known as: GLUCOTROL   hydrALAZINE 25 MG tablet Commonly known as: APRESOLINE   HYDROcodone-acetaminophen 10-325 MG tablet Commonly known as: NORCO   Jardiance 10 MG Tabs tablet Generic drug: empagliflozin   LORazepam 1 MG tablet Commonly known as: ATIVAN   metFORMIN 500 MG tablet Commonly known as: GLUCOPHAGE   torsemide 20 MG tablet Commonly known as: DEMADEX     TAKE these medications   acetaminophen 500 MG tablet Commonly known as: TYLENOL Take 500 mg by mouth as needed for moderate pain or headache.   amiodarone 200 MG tablet Commonly known as: PACERONE TAKE 1 TABLET BY MOUTH EVERY MORNING   apixaban 5 MG Tabs tablet Commonly known as: Eliquis Take 1 tablet (5 mg total) by mouth 2 (two) times daily.   atorvastatin 80 MG tablet Commonly known as: LIPITOR Place 1 tablet (80 mg total) into feeding tube daily. Start taking on: September 22, 2020 What changed:   medication strength  how much to take  how to take this   Cyanocobalamin 5000 MCG Subl Take 1 tablet by mouth daily.   donepezil 5 MG tablet Commonly known as: ARICEPT Take 5 mg by mouth daily.   DULoxetine 30 MG capsule Commonly known as: CYMBALTA Take 30 mg by mouth 2 (two) times daily.   feeding supplement Liqd Take 237 mLs by mouth 3 (three) times daily between meals.   furosemide 20 MG tablet Commonly known as: LASIX Take 1 tablet (20 mg total) by mouth daily. Start taking on: September 22, 2020   gabapentin 100 MG capsule Commonly known as: NEURONTIN Take 1 capsule (100 mg total) by mouth 3 (three) times daily. What changed:   medication strength  how much to take  when to take this  additional instructions   insulin  glargine 100 UNIT/ML injection Commonly known as: LANTUS Inject 0.1 mLs (10 Units total) into the skin daily. What changed:   how much to take  when to take this   Lactase 9000 units Chew 1 PO Q4H WHEN YOU EAT ICE CREAM OR CHEESE What changed:   how much to take  how to take this  when to take this  reasons to take this  additional instructions   levETIRAcetam 500 MG tablet Commonly known as: Keppra Take 2 tablets (1,000 mg total) by mouth 2 (two) times daily. What changed:   medication strength  how much to take   levothyroxine 175 MCG tablet Commonly known as: SYNTHROID Take 1 tablet by mouth daily.   loperamide 2 MG capsule Commonly known as: IMODIUM 1 PO Q4H PRN LOOSE STOOLS What changed:   how much to take  how to take this  when to take this  reasons to take this   magnesium oxide 400 MG tablet Commonly known as: MAG-OX Take 1 tablet (400 mg total) by mouth 2 (two) times daily.   metoprolol tartrate 25 MG tablet Commonly known as: LOPRESSOR Take 0.5 tablets (12.5 mg total) by mouth 2 (two) times daily.   multivitamin with minerals Tabs tablet Take 1 tablet by mouth daily.   ondansetron 4 MG tablet Commonly known as: ZOFRAN TAKE 1 TABLET BY MOUTH FOUR TIMES DAILY AS NEEDED FOR NAUSEA What changed: See the new instructions.   OXYGEN 2 L daily.   pantoprazole 40 MG tablet Commonly known as: PROTONIX TAKE ONE TABLET BY MOUTH TWICE DAILY BEFORE APPLY MEAL What changed: See the new instructions.   potassium chloride 10 MEQ tablet Commonly known as: KLOR-CON Take 20 mEq by mouth daily.   SM Melatonin 3 MG Tabs tablet Generic drug: melatonin Take 3 mg by mouth at bedtime as needed for sleep.   Vitamin D3 50 MCG (2000 UT) capsule Take 2,000 Units by mouth daily.            Durable Medical Equipment  (From admission, onward)         Start     Ordered   09/21/20 0928  For home use only DME Hospital bed  Once       Question  Answer Comment  Length of Need Lifetime   Patient has (list medical condition): debility, weakness, swallowing impairment   The above medical condition requires: Patient requires the ability to reposition frequently   Head must be elevated greater than: 30 degrees   Bed type Semi-electric   Hoyer Lift Yes   Support Surface: Low Air loss Mattress      09/21/20 4098   09/21/20 0926  For home use only DME lightweight manual wheelchair with seat cushion  Once       Comments: Patient suffers from S/P septic shock and pneumonia, generalized weakness which impairs their ability to perform daily activities like ADLs:[id number] in the home.  A walking JXB:[id number] will not resolve  issue with performing activities of daily living. A wheelchair will allow patient to safely perform daily activities. Patient is not able to propel themselves in the home using a standard weight wheelchair due to weakness:20653. Patient can self propel in the lightweight wheelchair. Length of need 12 months. Accessories: elevating leg rests (ELRs), wheel locks, extensions and anti-tippers.   09/21/20 9562   09/20/20 1202  For home use only DME Other see comment  Once       Comments: Bed side table for hospital bed  Question:  Length of Need  Answer:  Lifetime   09/20/20 1202         Allergies  Allergen Reactions  . Citalopram Hydrobromide Other (See Comments)    Dyskinesia Other reaction(s): Other (See Comments) Dyskinesia  . Codeine Nausea And Vomiting and Other (See Comments)    HALLUCINATIONS Other reaction(s): Unknown Other reaction(s): Other (See Comments) HALLUCINATIONS   . Hydromorphone Hcl Other (See Comments)    Made her pass out Other reaction(s): Other (See Comments) Made her pass out  . Metoclopramide Other (See Comments)    DYSKINESIA Other reaction(s): Other (See Comments) DYSKINESIA  . Hyoscyamine     MADE DIARRHEA WORSE    Follow-up Information    Gareth Morgan, MD. Schedule an appointment  as soon as possible for a visit in 2 week(s).   Specialty: Family Medicine  Contact information: [address] [place] [address] [phone]        Antoine Poche, MD .   Specialty: Cardiology Contact information: [address] [place] [address] [phone]        [facility] [place] Follow up.   Why: [facility] will be providing a hospital bed, hoyer lift, and light weight wheelchair that will be delivered to the home tomorrow - 09/22/2020. Contact information: [address]  [place] [address] [phone]                The results of significant diagnostics from this hospitalization (including imaging, microbiology, ancillary and laboratory) are listed below for reference.    Significant Diagnostic Studies: CT ABDOMEN PELVIS WO CONTRAST  Result Date: 08/24/2020 CLINICAL DATA:  Status post fall, unresponsive. EXAM: CT CHEST, ABDOMEN AND PELVIS WITHOUT CONTRAST TECHNIQUE: Multidetector CT imaging of the chest, abdomen and pelvis was performed following the standard protocol without IV contrast. COMPARISON:  None. FINDINGS: CT CHEST FINDINGS Cardiovascular: A right internal jugular venous catheter is in place. There is moderate severity calcification of the aortic arch. There is moderate severity cardiomegaly. No pericardial effusion. Mediastinum/Nodes: No enlarged mediastinal, hilar, or axillary lymph nodes. Thyroid gland, trachea, and esophagus demonstrate no significant findings. Lungs/Pleura: Endotracheal and nasogastric tubes are in place. Moderate severity left upper lobe and bilateral lower lobe atelectasis and/or infiltrate is noted. Small bilateral pleural effusions are noted, right greater than left. No pneumothorax is identified. Musculoskeletal: There is evidence of prior vertebroplasty at the level of T12. A chronic fracture deformity of the sternum is seen. Multilevel degenerative changes are noted throughout the thoracic spine.  CT ABDOMEN PELVIS FINDINGS Hepatobiliary: No focal liver abnormality is seen. Status post cholecystectomy. No biliary dilatation. Pancreas: Unremarkable. No pancreatic ductal dilatation or surrounding inflammatory changes. Spleen: Normal in size without focal abnormality. Adrenals/Urinary Tract: Adrenal glands are unremarkable. Kidneys are normal, without renal calculi, focal lesion, or hydronephrosis. A Foley catheter is seen within the urinary bladder. Stomach/Bowel: A nasogastric tube is seen with its distal tip noted within the proximal duodenum. The stomach is otherwise within normal limits. The appendix is not clearly visualized. No evidence of bowel wall thickening, distention, or inflammatory changes. Noninflamed diverticula are seen throughout the sigmoid colon. Vascular/Lymphatic: Aortic atherosclerosis. No enlarged abdominal or pelvic lymph nodes. Reproductive: Status post hysterectomy. No adnexal masses. Other: There is a 2.5 cm x 4.5 cm fat containing umbilical hernia. No abdominopelvic ascites. Musculoskeletal: Multilevel degenerative changes seen throughout the lumbar spine. IMPRESSION: 1. Moderate severity left upper lobe and bilateral lower lobe atelectasis and/or infiltrate. 2. Small bilateral pleural effusions, right greater than left. 3. Moderate severity cardiomegaly. 4. Evidence of prior vertebroplasty at the level of T12. 5. Sigmoid diverticulosis. 6. Fat-containing umbilical hernia. 7. Aortic atherosclerosis. Aortic Atherosclerosis (ICD10-I70.0). Electronically Signed   By: Aram Candela M.D.   On: 08/24/2020 00:39   CT HEAD WO CONTRAST  Result Date: 09/06/2020 CLINICAL DATA:  Change in mental status EXAM: CT HEAD WITHOUT CONTRAST TECHNIQUE: Contiguous axial images were obtained from the base of the skull through the vertex without intravenous contrast. COMPARISON:  08/30/2020 FINDINGS: Brain: There is no acute intracranial hemorrhage, mass effect, or edema. Gray-white differentiation  is preserved. There is no extra-axial fluid collection. Prominence of the ventricles and sulci reflects stable parenchymal volume loss. Patchy hypoattenuation in the supratentorial white matter likely reflects stable chronic microvascular ischemic changes. Vascular: There is atherosclerotic calcification at the skull base. Skull: Calvarium is  unremarkable. Sinuses/Orbits: No acute finding. Other: Bilateral mastoid effusions. IMPRESSION: No acute intracranial abnormality. No significant change since 08/30/2020. Electronically Signed   By: Guadlupe Spanish M.D.   On: 09/06/2020 14:17   CT HEAD WO CONTRAST  Result Date: 08/30/2020 CLINICAL DATA:  Mental status changes. Decreased responsiveness. Acute stroke on the right shown by recent MRI. EXAM: CT HEAD WITHOUT CONTRAST TECHNIQUE: Contiguous axial images were obtained from the base of the skull through the vertex without intravenous contrast. COMPARISON:  MRI 08/28/2020 FINDINGS: Brain: Mild age related volume loss. No focal brain insult visible by CT. Tiny white matter infarction in the right posterior frontal region is not resolved. No evidence new or progressive infarction. No hemorrhage, hydrocephalus or extra-axial collection. Vascular: There is atherosclerotic calcification of the major vessels at the base of the brain. Skull: Negative Sinuses/Orbits: Clear/normal Other: None IMPRESSION: No acute finding by CT. Tiny white matter infarction in the right posterior frontal region shown by MRI is not resolved. No evidence of new or progressive infarction. Electronically Signed   By: Paulina Fusi M.D.   On: 08/30/2020 01:10   CT HEAD WO CONTRAST  Result Date: 08/24/2020 CLINICAL DATA:  Status post fall, unresponsive. EXAM: CT HEAD WITHOUT CONTRAST TECHNIQUE: Contiguous axial images were obtained from the base of the skull through the vertex without intravenous contrast. COMPARISON:  August 15, 2020 FINDINGS: Brain: There is mild cerebral atrophy with widening  of the extra-axial spaces and ventricular dilatation. There are areas of decreased attenuation within the white matter tracts of the supratentorial brain, consistent with microvascular disease changes. Vascular: No hyperdense vessel or unexpected calcification. Skull: Normal. Negative for fracture or focal lesion. Sinuses/Orbits: No acute finding. Other: Endotracheal and orogastric tubes are in place. IMPRESSION: No acute intracranial pathology. Electronically Signed   By: Aram Candela M.D.   On: 08/24/2020 00:30   CT Chest Wo Contrast  Result Date: 08/24/2020 CLINICAL DATA:  Status post fall, unresponsive. EXAM: CT CHEST, ABDOMEN AND PELVIS WITHOUT CONTRAST TECHNIQUE: Multidetector CT imaging of the chest, abdomen and pelvis was performed following the standard protocol without IV contrast. COMPARISON:  None. FINDINGS: CT CHEST FINDINGS Cardiovascular: A right internal jugular venous catheter is in place. There is moderate severity calcification of the aortic arch. There is moderate severity cardiomegaly. No pericardial effusion. Mediastinum/Nodes: No enlarged mediastinal, hilar, or axillary lymph nodes. Thyroid gland, trachea, and esophagus demonstrate no significant findings. Lungs/Pleura: Endotracheal and nasogastric tubes are in place. Moderate severity left upper lobe and bilateral lower lobe atelectasis and/or infiltrate is noted. Small bilateral pleural effusions are noted, right greater than left. No pneumothorax is identified. Musculoskeletal: There is evidence of prior vertebroplasty at the level of T12. A chronic fracture deformity of the sternum is seen. Multilevel degenerative changes are noted throughout the thoracic spine. CT ABDOMEN PELVIS FINDINGS Hepatobiliary: No focal liver abnormality is seen. Status post cholecystectomy. No biliary dilatation. Pancreas: Unremarkable. No pancreatic ductal dilatation or surrounding inflammatory changes. Spleen: Normal in size without focal abnormality.  Adrenals/Urinary Tract: Adrenal glands are unremarkable. Kidneys are normal, without renal calculi, focal lesion, or hydronephrosis. A Foley catheter is seen within the urinary bladder. Stomach/Bowel: A nasogastric tube is seen with its distal tip noted within the proximal duodenum. The stomach is otherwise within normal limits. The appendix is not clearly visualized. No evidence of bowel wall thickening, distention, or inflammatory changes. Noninflamed diverticula are seen throughout the sigmoid colon. Vascular/Lymphatic: Aortic atherosclerosis. No enlarged abdominal or pelvic lymph nodes. Reproductive: Status post hysterectomy.  No adnexal masses. Other: There is a 2.5 cm x 4.5 cm fat containing umbilical hernia. No abdominopelvic ascites. Musculoskeletal: Multilevel degenerative changes seen throughout the lumbar spine. IMPRESSION: 1. Moderate severity left upper lobe and bilateral lower lobe atelectasis and/or infiltrate. 2. Small bilateral pleural effusions, right greater than left. 3. Moderate severity cardiomegaly. 4. Evidence of prior vertebroplasty at the level of T12. 5. Sigmoid diverticulosis. 6. Fat-containing umbilical hernia. 7. Aortic atherosclerosis. Aortic Atherosclerosis (ICD10-I70.0). Electronically Signed   By: Aram Candela M.D.   On: 08/24/2020 00:40   CT CERVICAL SPINE WO CONTRAST  Result Date: 08/24/2020 CLINICAL DATA:  Status post fall, unresponsive. EXAM: CT CERVICAL SPINE WITHOUT CONTRAST TECHNIQUE: Multidetector CT imaging of the cervical spine was performed without intravenous contrast. Multiplanar CT image reconstructions were also generated. COMPARISON:  October 04, 2012 FINDINGS: Alignment: Normal. Skull base and vertebrae: No acute fracture. No primary bone lesion or focal pathologic process. Soft tissues and spinal canal: No prevertebral fluid or swelling. No visible canal hematoma. Disc levels: Marked severity endplate sclerosis and osteophyte formation is seen at the levels of  C3-C4, C4-C5, C5-C6 and C6-C7. There is marked severity narrowing of the anterior atlantoaxial articulation. Mild to moderate severity intervertebral disc space narrowing is seen at the levels of C5-C6 and C6-C7. Moderate severity bilateral multilevel facet joint hypertrophy is noted. Upper chest: Negative. Other: Endotracheal and orogastric tubes are noted. IMPRESSION: 1. No acute osseous abnormality. 2. Marked severity multilevel degenerative changes, most prominent at the levels of C3-C4, C4-C5, C5-C6 and C6-C7. Electronically Signed   By: Aram Candela M.D.   On: 08/24/2020 00:32   MR BRAIN WO CONTRAST  Result Date: 09/08/2020 CLINICAL DATA:  Mental status change EXAM: MRI HEAD WITHOUT CONTRAST TECHNIQUE: Multiplanar, multiecho pulse sequences of the brain and surrounding structures were obtained without intravenous contrast. COMPARISON:  08/28/2020 FINDINGS: Motion artifact is present. Brain: Punctate focus of diffusion hyperintensity on the prior study is no longer present. There is no new reduced diffusion identified. No evidence of intracranial hemorrhage. There is no intracranial mass, mass effect, or edema. There is no hydrocephalus or extra-axial fluid collection. Prominence of the ventricles and sulci reflects stable parenchymal volume loss. Patchy T2 hyperintensity in the supratentorial white matter is nonspecific but probably reflects stable chronic microvascular ischemic changes. Vascular: Major vessel flow voids at the skull base are preserved. Skull and upper cervical spine: Normal marrow signal is preserved. Sinuses/Orbits: Minor paranasal sinus mucosal thickening. Orbits are unremarkable. Other: Sella is unremarkable.  Bilateral mastoid effusions. IMPRESSION: Motion degradation.  No acute infarction, hemorrhage, or mass. Electronically Signed   By: Guadlupe Spanish M.D.   On: 09/08/2020 17:35   MR BRAIN WO CONTRAST  Result Date: 08/28/2020 CLINICAL DATA:  Mental status change, unknown  cause EXAM: MRI HEAD WITHOUT CONTRAST TECHNIQUE: Multiplanar, multiecho pulse sequences of the brain and surrounding structures were obtained without intravenous contrast. COMPARISON:  08/24/2020 and prior. FINDINGS: Please note some image sequences are degraded by motion artifact. Brain: 2 mm right centrum semiovale acute/subacute insult. No intracranial hemorrhage. No midline shift, ventriculomegaly or extra-axial fluid collection. No mass lesion. Mild cerebral atrophy with ex vacuo dilatation. Mild chronic microvascular ischemic changes. Vascular: Normal flow voids. Skull and upper cervical spine: Normal marrow signal. Sinuses/Orbits: Normal orbits. Small sphenoid sinus mucous retention cyst. Bilateral mastoid effusions. Other: None. IMPRESSION: Acute/subacute 2 mm right centrum semiovale lacunar insult. Mild cerebral atrophy and chronic microvascular ischemic changes. These results will be called to the ordering clinician or representative  by the Radiologist Assistant, and communication documented in the PACS or [employer]. Electronically Signed   By: Stana Bunting M.D.   On: 08/28/2020 18:52   US Carotid Bilateral  Result Date: 08/29/2020 CLINICAL DATA:  CVA. History hypertension, hyperlipidemia and diabetes. EXAM: BILATERAL CAROTID DUPLEX ULTRASOUND TECHNIQUE: Wallace Cullens scale imaging, color Doppler and duplex ultrasound were performed of bilateral carotid and vertebral arteries in the neck. COMPARISON:  12/28/2015 FINDINGS: Criteria: Quantification of carotid stenosis is based on velocity parameters that correlate the residual internal carotid diameter with NASCET-based stenosis levels, using the diameter of the distal internal carotid lumen as the denominator for stenosis measurement. The following velocity measurements were obtained: RIGHT The right carotid system and vertebral artery were not evaluated secondary to overlying bandages. LEFT ICA: 55/9 cm/sec CCA: 57/10 cm/sec SYSTOLIC ICA/CCA  RATIO:  1.0 ECA: 82 cm/sec LEFT CAROTID ARTERY: There is a minimal amount of eccentric echogenic plaque within the left carotid bulb (image 15 and 17), extending to involve the origin and proximal aspects of the left internal carotid artery (image 25), not resulting in elevated peak systolic velocities within the interrogated course the left internal carotid artery to suggest a hemodynamically significant stenosis. LEFT VERTEBRAL ARTERY:  Antegrade flow IMPRESSION: 1. Minimal amount of left-sided atherosclerotic plaque, morphologically similar to the 2017 examination and again not resulting in hemodynamically significant stenosis. 2. The right carotid system and vertebral artery were not evaluated secondary to overlying bandages. While no hemodynamically significant narrowing was demonstrated on prior carotid Doppler ultrasound performed in 2017, if clinical concern persists, further evaluation with CTA of the head and neck could be performed as indicated. Electronically Signed   By: Simonne Come M.D.   On: 08/29/2020 14:33   DG Chest Port 1 View  Result Date: 09/20/2020 CLINICAL DATA:  Shortness of breath. EXAM: PORTABLE CHEST 1 VIEW COMPARISON:  Prior chest radiograph 09/11/2020 and earlier. FINDINGS: Unchanged cardiomegaly. Aeration of the right lung has significantly improved as compared to the prior examination of 09/11/2020 with persistent prominence of the interstitial markings within the right lung and persistent airspace opacities within the medial right lung base. Persistent interstitial prominence and patchy airspace opacities throughout much of the left lung. No sizable pleural effusion. No evidence of pneumothorax IMPRESSION: Unchanged cardiomegaly. Improved aeration of the right lung as compared to 09/11/2020. Persistent prominence of the interstitial lung markings bilaterally with persistent airspace opacities within the medial right lung base and scattered throughout much of the left lung.  Findings likely reflect pulmonary edema, possibly with superimposed multifocal pneumonia. Electronically Signed   By: Jackey Loge DO   On: 09/20/2020 07:56   DG Chest Port 1 View  Result Date: 09/11/2020 CLINICAL DATA:  Respiratory failure.  Atelectasis. EXAM: PORTABLE CHEST 1 VIEW COMPARISON:  September 10, 2020 FINDINGS: Enteric tube tip is below the diaphragm. No pneumothorax. Airspace opacity persists in the right upper lobe and left lower lung regions. There is mild right base atelectasis. Small left pleural effusion. There is interstitial edema. There is persistent cardiomegaly with pulmonary venous hypertension. No adenopathy. There is aortic atherosclerosis. There is mitral annulus calcification. No bone lesions. IMPRESSION: Suspect underlying congestive heart failure, stable. Areas of ill-defined airspace opacity in the right upper lobe and left lower lung regions raise concern for multifocal pneumonia superimposed on apparent congestive heart failure. Note that alveolar edema could present in this manner with potential pneumonia alveolar edema presenting currently. Appearance essentially unchanged from 1 day prior. Aortic Atherosclerosis (ICD10-I70.0). Electronically Signed  By: Bretta BangWilliam  Woodruff III M.D.   On: 09/11/2020 07:49   DG Chest Port 1 View  Result Date: 09/10/2020 CLINICAL DATA:  Respiratory failure EXAM: PORTABLE CHEST 1 VIEW COMPARISON:  September 08, 2020 FINDINGS: Enteric tube tip is below the diaphragm. No pneumothorax. There is airspace opacity in the right upper lobe and left lower lobe regions, most likely representing multifocal pneumonia. Small left pleural effusion. There is a degree of interstitial edema as well. There is cardiomegaly with pulmonary venous hypertension. No adenopathy. There is aortic atherosclerosis. There is mitral annulus calcification. No bone lesions. IMPRESSION: Enteric tube as described. There is airspace opacity in the right upper lobe and left base regions  which are suspicious for multifocal pneumonia. Pulmonary edema could present in this manner and is a differential consideration for these airspace opacities. Elsewhere, there is cardiomegaly with pulmonary venous hypertension and mild interstitial edema. Small left pleural effusion. Suspect a degree of underlying congestive heart failure. Aortic Atherosclerosis (ICD10-I70.0). Electronically Signed   By: Bretta BangWilliam  Woodruff III M.D.   On: 09/10/2020 10:23   DG CHEST PORT 1 VIEW  Result Date: 09/08/2020 CLINICAL DATA:  Pneumonia. EXAM: PORTABLE CHEST 1 VIEW COMPARISON:  09/05/2020. FINDINGS: Interval removal of right IJ line. Interval placement of feeding tube with tip below hemidiaphragm. Cardiomegaly. Diffuse bilateral pulmonary infiltrates/edema again noted. No interim change. Low lung volumes with bibasilar atelectasis. Small left pleural effusion cannot be excluded. No pneumothorax. IMPRESSION: 1. Interval removal of right IJ line. Interval placement of feeding tube with tip below hemidiaphragm. 2. Cardiomegaly with diffuse bilateral pulmonary infiltrates/edema again noted. No interim change. Low lung volumes with bibasilar atelectasis again noted without interim change. Small left pleural effusion cannot be excluded. Electronically Signed   By: Maisie Fushomas  Register   On: 09/08/2020 12:36   DG Chest Port 1 View  Result Date: 09/05/2020 CLINICAL DATA:  Acute respiratory failure EXAM: PORTABLE CHEST 1 VIEW COMPARISON:  09/03/2020 FINDINGS: Interval extubation. Nasogastric tube removed. Right internal jugular central venous catheter is unchanged with its tip within the superior right atrium. Pulmonary insufflation has decreased significantly with resultant right basilar atelectasis and vascular crowding at the hila. Retrocardiac opacification persists, atelectasis or infiltrate within this region. Cardiac size is mildly enlarged. No pneumothorax. Small left pleural effusion is difficult to exclude. IMPRESSION:  Interval extubation with marked interval decrease in pulmonary insufflation. Persistent retrocardiac opacification compatible with atelectasis or infiltrate within this region. Electronically Signed   By: Helyn NumbersAshesh  Parikh MD   On: 09/05/2020 07:02   DG CHEST PORT 1 VIEW  Result Date: 09/03/2020 CLINICAL DATA:  Endotracheal tube position EXAM: PORTABLE CHEST 1 VIEW COMPARISON:  08/31/2020 FINDINGS: Cardiac enlargement. Mild vascular congestion without edema. Left lower lobe consolidation unchanged. Mild right lower lobe atelectasis. No significant effusion Endotracheal tube 2 cm above the carina. NG tube in the stomach with the tip not visualized. Right jugular central venous catheter tip in the right atrium unchanged. No pneumothorax. IMPRESSION: Endotracheal tube 2 cm above the carina unchanged. Central venous catheter tip in the mid right atrium Cardiac enlargement with mild vascular congestion. Left lower lobe consolidation unchanged. Electronically Signed   By: Marlan Palauharles  Clark M.D.   On: 09/03/2020 13:02   DG CHEST PORT 1 VIEW  Result Date: 08/31/2020 CLINICAL DATA:  70 year old female with history of mucous plug. Check support apparatus. EXAM: PORTABLE CHEST 1 VIEW COMPARISON:  Chest x-ray 08/30/2020. FINDINGS: An endotracheal tube is in place with tip 3.1 cm above the carina. There is a  right-sided internal jugular central venous catheter with tip terminating in the right atrium. A nasogastric tube is seen extending into the stomach, however, the tip of the nasogastric tube extends below the lower margin of the image. Lung volumes are low. Improving aeration throughout the left upper lobe compared to the prior study. Persistent dense opacification in the base of the left hemithorax indicative of significant residual areas of atelectasis and/or consolidation. Right lung is clear. Probable small left pleural effusion. No definite right pleural effusion. Heart size is mildly enlarged. Upper mediastinal  contours are distorted by patient rotation. Atherosclerotic calcifications in the thoracic aorta. IMPRESSION: 1. Support apparatus, as above. 2. Improving aeration in the left upper lobe. Persistent areas of atelectasis and/or consolidation throughout the left lower lobe with small left pleural effusion. 3. Mild cardiomegaly. 4. Aortic atherosclerosis. Electronically Signed   By: Trudie Reed M.D.   On: 08/31/2020 12:23   DG CHEST PORT 1 VIEW  Result Date: 08/30/2020 CLINICAL DATA:  Intubated EXAM: PORTABLE CHEST 1 VIEW COMPARISON:  Same day radiograph FINDINGS: The cardiomediastinal silhouette is unchanged and partially obscured in contour.ETT tip terminates 3 cm above the carina. The enteric tube courses through the chest to the abdomen beyond the field-of-view. RIGHT IJ CVC tip terminates over the RIGHT atrium. No definitive pleural effusion. No pneumothorax. Persistent complete opacification of the LEFT lung. Atherosclerotic calcifications. Visualized abdomen is unremarkable. Multilevel degenerative changes of the thoracic spine. Status post vertebral augmentation at the thoracolumbar junction. IMPRESSION: 1. Persistent complete opacification of the LEFT lung. 2. Support apparatus as described. Electronically Signed   By: Meda Klinefelter MD   On: 08/30/2020 11:41   DG CHEST PORT 1 VIEW  Result Date: 08/30/2020 CLINICAL DATA:  Acute respiratory failure EXAM: PORTABLE CHEST 1 VIEW COMPARISON:  August 29, 2020 FINDINGS: The cardiomediastinal silhouette is obscured. There is new complete opacification of the LEFT lung. Interval extubation and removal of enteric tube. RIGHT IJ CVC tip terminates over the region of the RIGHT atrium. Mild interstitial edema of the RIGHT lung, unchanged comparison to prior. Atherosclerotic calcifications of the aorta. Degenerative changes of the thoracic spine. Status post vertebral augmentation of a vertebral body at the thoracolumbar junction. Visualized abdomen is  unremarkable. IMPRESSION: New complete opacification of the LEFT lung, most likely related to mucous plugging or aspiration. Electronically Signed   By: Meda Klinefelter MD   On: 08/30/2020 09:24   DG Chest Port 1 View  Result Date: 08/28/2020 CLINICAL DATA:  Hypoxia EXAM: PORTABLE CHEST 1 VIEW COMPARISON:  August 26, 2020 chest radiograph and August 23, 2020 chest CT FINDINGS: Endotracheal tube tip is 3.8 cm above the carina. Nasogastric tube tip and side port are below the diaphragm. Central catheter tip is at the cavoatrial junction. No pneumothorax. There is airspace consolidation in the left base with small left pleural effusion. There is mild interstitial edema in the lower lung regions. There is cardiomegaly with pulmonary vascularity within normal limits. There is aortic atherosclerosis. No adenopathy. No bone lesions IMPRESSION: Tube and catheter positions as described without pneumothorax. Persistent left lower lobe airspace consolidation, concerning combination of atelectasis and pneumonia with small left pleural effusion. Mild bibasilar interstitial edema. Stable cardiomegaly. Aortic Atherosclerosis (ICD10-I70.0). Electronically Signed   By: Bretta Bang III M.D.   On: 08/28/2020 07:47   DG CHEST PORT 1 VIEW  Result Date: 08/26/2020 CLINICAL DATA:  Intubation. EXAM: PORTABLE CHEST 1 VIEW COMPARISON:  08/25/2020. FINDINGS: Endotracheal tube, NG tube, right IJ line in stable  position. Stable cardiomegaly. Low lung volumes persistent left base atelectasis/infiltrate. Stable mild bilateral interstitial prominence. Small left pleural effusion again noted. No pneumothorax. IMPRESSION: 1. Lines and tubes in stable position. 2. Low lung volumes. Persistent left base atelectasis/infiltrate and small left pleural effusion. Stable mild bilateral interstitial prominence. 3. Stable cardiomegaly. Electronically Signed   By: Maisie Fus  Register   On: 08/26/2020 05:40   DG Chest Port 1 View  Result  Date: 08/25/2020 CLINICAL DATA:  Respiratory failure. EXAM: PORTABLE CHEST 1 VIEW COMPARISON:  Chest x-ray 08/23/2020. FINDINGS: Endotracheal tube, NG tube in stable position. Right IJ line in unchanged position with tip in the right atrium. Cardiomegaly with bilateral interstitial prominence suggesting interstitial edema. Left base atelectasis/infiltrate. No prominent pleural effusion. No pneumothorax. IMPRESSION: 1. Endotracheal tube, NG tube in stable position. Right IJ line in unchanged position with tip in the right atrium 2. Cardiomegaly with bilateral interstitial prominence suggesting interstitial edema. 3.  Left base atelectasis/infiltrate. Electronically Signed   By: Maisie Fus  Register   On: 08/25/2020 05:27   DG Chest Portable 1 View  Result Date: 08/23/2020 CLINICAL DATA:  Status post intubation. EXAM: PORTABLE CHEST 1 VIEW COMPARISON:  August 17, 2020 FINDINGS: An endotracheal tube is seen with its distal tip approximately 2.1 cm from the carina. A nasogastric tube is noted with its distal end seen within the expected region of the gastric antrum. There is a right internal jugular venous catheter. Its distal tip is seen within the right atrium, approximately 5.2 cm distal to the junction of the superior vena cava and right atrium. Mild atelectasis and/or infiltrate is noted within the right lung base. There is no evidence of a pleural effusion or pneumothorax. The cardiac silhouette is markedly enlarged. The visualized skeletal structures are unremarkable. IMPRESSION: 1. Interval endotracheal tube, nasogastric tube and right internal jugular venous catheter placement and positioning, as described above. 2. Mild right basilar atelectasis and/or infiltrate. Electronically Signed   By: Aram Candela M.D.   On: 08/23/2020 23:56   EEG adult  Result Date: 09/07/2020 Charlsie Quest, MD     09/07/2020  1:07 PM Patient Name: [name] MRN: [id number] Epilepsy Attending: Charlsie Quest Referring  Physician/Provider: Dr. Albertine Grates Date: 09/07/2020 Duration: 25.55 mins Patient history: 70 year old female with altered mental status. EEG to evaluate for seizures. Level of alertness: Awake AEDs during EEG study: Keppra Technical aspects: This EEG study was done with scalp electrodes positioned according to the 10-20 International system of electrode placement. Electrical activity was acquired at a sampling rate of 500Hz  and reviewed with a high frequency filter of 70Hz  and a low frequency filter of 1Hz . EEG data were recorded continuously and digitally stored. Description: The posterior dominant rhythm consists of 7 Hz activity of moderate voltage (25-35 uV) seen predominantly in posterior head regions, symmetric and reactive to eye opening and eye closing. EEG showed continuous generalized 3 to 6 Hz theta-delta slowing. Hyperventilation and photic stimulation were not performed.   ABNORMALITY -Continuous slow, generalized IMPRESSION: This study is suggestive of mild to moderate diffuse encephalopathy, nonspecific etiology. No seizures or epileptiform discharges were seen throughout the recording. Charlsie Quest   ECHOCARDIOGRAM COMPLETE  Result Date: 08/29/2020    ECHOCARDIOGRAM REPORT   Patient Name:   LATRISA ONSUREZ Date of Exam: 08/29/2020 Medical Rec #:  [id number]     Height:       66.0 in Accession #:    [id number]    Weight:  235.7 lb Date of Birth:  1950/08/26     BSA:          2.144 m Patient Age:    70 years      BP:           168/101 mmHg Patient Gender: F             HR:           102 bpm. Exam Location:  Inpatient Procedure: 2D Echo, Cardiac Doppler and Color Doppler Indications:    Stroke I63.9  History:        Patient has prior history of Echocardiogram examinations, most                 recent 11/28/2017. CHF, Arrythmias:Atrial Fibrillation; Risk                 Factors:Diabetes. Acute respiratory failure secondary to                 possible pneumonia. Septic shock. Chronic kidney disease.                  Anemia.  Sonographer:    Leta Jungling RDCS Referring Phys: [phone] DAVID TAT IMPRESSIONS  1. Left ventricular ejection fraction, by estimation, is 50 to 55%. The left ventricle has normal function. The left ventricle has no regional wall motion abnormalities. There is severe left ventricular hypertrophy. Left ventricular diastolic parameters  are indeterminate.  2. Right ventricular systolic function is mildly reduced. The right ventricular size is mildly enlarged.  3. Left atrial size was severely dilated.  4. Right atrial size was severely dilated.  5. The mitral valve is normal in structure. Mild mitral valve regurgitation. No evidence of mitral stenosis.  6. The tricuspid valve is abnormal. Tricuspid valve regurgitation is moderate.  7. The aortic valve is tricuspid. Aortic valve regurgitation is not visualized. No aortic stenosis is present.  8. The inferior vena cava is normal in size with <50% respiratory variability, suggesting right atrial pressure of 8 mmHg. FINDINGS  Left Ventricle: Left ventricular ejection fraction, by estimation, is 50 to 55%. The left ventricle has normal function. The left ventricle has no regional wall motion abnormalities. The left ventricular internal cavity size was normal in size. There is  severe left ventricular hypertrophy. Left ventricular diastolic parameters are indeterminate. Right Ventricle: The right ventricular size is mildly enlarged. Right vetricular wall thickness was not assessed. Right ventricular systolic function is mildly reduced. The tricuspid regurgitant velocity is 3.96 m/s, and with an assumed right atrial pressure of 15 mmHg, the estimated right ventricular systolic pressure is 77.7 mmHg. Left Atrium: Left atrial size was severely dilated. Right Atrium: Right atrial size was severely dilated. Pericardium: There is no evidence of pericardial effusion. Mitral Valve: The mitral valve is normal in structure. There is mild thickening of the mitral  valve leaflet(s). There is mild calcification of the mitral valve leaflet(s). Mild mitral annular calcification. Mild mitral valve regurgitation. No evidence of  mitral valve stenosis. Tricuspid Valve: The tricuspid valve is abnormal. Tricuspid valve regurgitation is moderate . No evidence of tricuspid stenosis. Aortic Valve: The aortic valve is tricuspid. Aortic valve regurgitation is not visualized. No aortic stenosis is present. Aortic valve mean gradient measures 3.1 mmHg. Aortic valve peak gradient measures 6.9 mmHg. Aortic valve area, by VTI measures 3.30 cm. Pulmonic Valve: The pulmonic valve was not well visualized. Pulmonic valve regurgitation is not visualized. No evidence of pulmonic stenosis. Aorta: The aortic  root is normal in size and structure. Pulmonary Artery: Severe pulmonary HTN, PASP is 70. Venous: The inferior vena cava is normal in size with less than 50% respiratory variability, suggesting right atrial pressure of 8 mmHg. IAS/Shunts: No atrial level shunt detected by color flow Doppler.  LEFT VENTRICLE PLAX 2D LVIDd:         3.79 cm LVIDs:         2.97 cm LV PW:         1.99 cm LV IVS:        1.67 cm LVOT diam:     2.30 cm LV SV:         62 LV SV Index:   29 LVOT Area:     4.15 cm  RIGHT VENTRICLE RV S prime:     8.85 cm/s TAPSE (M-mode): 1.5 cm LEFT ATRIUM              Index       RIGHT ATRIUM           Index LA diam:        5.10 cm  2.38 cm/m  RA Area:     32.90 cm LA Vol (A2C):   99.6 ml  46.45 ml/m RA Volume:   114.00 ml 53.16 ml/m LA Vol (A4C):   112.0 ml 52.23 ml/m LA Biplane Vol: 106.0 ml 49.43 ml/m  AORTIC VALVE AV Area (Vmax):    3.04 cm AV Area (Vmean):   2.88 cm AV Area (VTI):     3.30 cm AV Vmax:           131.09 cm/s AV Vmean:          82.375 cm/s AV VTI:            0.189 m AV Peak Grad:      6.9 mmHg AV Mean Grad:      3.1 mmHg LVOT Vmax:         95.98 cm/s LVOT Vmean:        57.106 cm/s LVOT VTI:          0.150 m LVOT/AV VTI ratio: 0.79  AORTA Ao Root diam: 3.10 cm Ao  Asc diam:  3.00 cm MITRAL VALVE                TRICUSPID VALVE MV Area (PHT): 8.37 cm     TR Peak grad:   62.7 mmHg MV Decel Time: 91 msec      TR Vmax:        396.00 cm/s MV E velocity: 125.67 cm/s                             SHUNTS                             Systemic VTI:  0.15 m                             Systemic Diam: 2.30 cm Dina Rich MD Electronically signed by Dina Rich MD Signature Date/Time: 08/29/2020/1:40:18 PM    Final     Microbiology: No results found for this or any previous visit (from the past 240 hour(s)).   Labs: Basic Metabolic Panel: Recent Labs  Lab 09/17/20 0207 09/19/20 0231 09/20/20 0231  NA 137 137 137  K 4.1 3.8 3.8  CL  98 98 97*  CO2 31 32 32  GLUCOSE 214* 101* 95  BUN 28* 26* 22  CREATININE 1.00 0.94 1.02*  CALCIUM 9.0 8.9 8.5*   Liver Function Tests: No results for input(s): AST, ALT, ALKPHOS, BILITOT, PROT, ALBUMIN in the last 168 hours. No results for input(s): LIPASE, AMYLASE in the last 168 hours. No results for input(s): AMMONIA in the last 168 hours. CBC: Recent Labs  Lab 09/17/20 0207  WBC 8.6  HGB 9.6*  HCT 31.2*  MCV 105.4*  PLT 183   Cardiac Enzymes: No results for input(s): CKTOTAL, CKMB, CKMBINDEX, TROPONINI in the last 168 hours. BNP: BNP (last 3 results) Recent Labs    11/16/19 1103  BNP 263.0*    ProBNP (last 3 results) No results for input(s): PROBNP in the last 8760 hours.  CBG: Recent Labs  Lab 09/19/20 1234 09/19/20 1733 09/19/20 1802 09/19/20 1830 09/19/20 2129  GLUCAP 91 44* 50* 73 86       Signed:  Junious Silk ANP [facility] 09/21/2020, 12:24 PM

## 2020-09-22 NOTE — Evaluation (Signed)
Physical Therapy Re-Evaluation Patient Details Name: [name] MRN: [id number] DOB: March 10, 1951 Today's Date: 09/22/2020   History of Present Illness  70 year old female admitted to [facility] 2/20 in the setting of septic shock from pneumonia requiring intubation, (extubated 2/25). MRI Brain 2/25 Acute/subacute 2 mm right centrum semiovale lacunar insult Transferred to [facility] 2/27 with AMS, respiratory failure due to a left lung mucous plug, Intubated 2/27-3/4. 3/8 MRI brain no changes from 2/25  PMhx: HTN, HLD, pulmonary HTN, CHF, AFib, DM, hypothyroidism, OSA. Pt re-evaluated by PT on 3/22 per MD request.  Clinical Impression  Pt seen for re-evaluation this session. Presenting with problem above and deficits below. Pt with improved participation and alertness this session. Required max A to sit at EOB, but was able to maintain sitting balance with supervision to perform HEP. Pt did report dizziness, and BP at 96/73, so returned to supine. Was able to perform supine HEP as well. [name] reports they plan to take pt home and are awaiting hospital bed delivery. Feel pt would benefit from max [facility] services at d/c given that pt is more alert and participatory. Will continue to follow acutely.     Follow Up Recommendations Home health PT;Supervision/Assistance - 24 hour (Refusing SNF and taking pt home. max HH services)    Equipment Recommendations  Wheelchair (measurements PT);Wheelchair cushion (measurements PT);Hospital bed (hoyer lift with pad) [facility] transport home   Recommendations for Other Services       Precautions / Restrictions Precautions Precautions: Fall Restrictions Weight Bearing Restrictions: No      Mobility  Bed Mobility Overal bed mobility: Needs Assistance Bed Mobility: Sidelying to Sit;Sit to Sidelying   Sidelying to sit: Max assist     Sit to sidelying: Min assist General bed mobility comments: Max A for trunk elevation to come to sitting. Pt attempting to push with  BUE to come to sitting. Min A for LE assist to return to sidelying. Able to sit at EOB for ther ex. Did complain of some dizziness with prolonged sitting; checked BP and BP at 96/73.    Transfers                    Ambulation/Gait                Stairs            Wheelchair Mobility    Modified Rankin (Stroke Patients Only)       Balance Overall balance assessment: Needs assistance Sitting-balance support: No upper extremity supported;Feet supported Sitting balance-Leahy Scale: Fair Sitting balance - Comments: Pt with improved sitting balance. Gross supervision for safety.                                     Pertinent Vitals/Pain Pain Assessment: Faces Faces Pain Scale: No hurt    Home Living Family/patient expects to be discharged to:: Private residence Living Arrangements: Children;Spouse/significant other Available Help at Discharge: Family;Available 24 hours/day Type of Home: Mobile home Home Access: Stairs to enter Entrance Stairs-Rails: Right;Left Entrance Stairs-Number of Steps: 5 Home Layout: One level Home Equipment: Bedside commode;Toilet riser;Shower seat;Walker - 2 wheels;Cane - single point;Wheelchair - manual      Prior Function Level of Independence: Needs assistance   Gait / Transfers Assistance Needed: pt normally walks with RW  ADL's / Homemaking Assistance Needed: family does all the homemaking, prepackaged medication, independent with ADLs supervision for  bathing        Hand Dominance        Extremity/Trunk Assessment   Upper Extremity Assessment Upper Extremity Assessment: Generalized weakness    Lower Extremity Assessment Lower Extremity Assessment: Generalized weakness    Cervical / Trunk Assessment Cervical / Trunk Assessment: Kyphotic  Communication   Communication: Expressive difficulties  Cognition Arousal/Alertness: Awake/alert Behavior During Therapy: WFL for tasks  assessed/performed Overall Cognitive Status: Impaired/Different from baseline Area of Impairment: Problem solving                             Problem Solving: Slow processing;Decreased initiation;Difficulty sequencing;Requires verbal cues;Requires tactile cues General Comments: Pt much more alert and participatory today. Following increased commands from previous session.      General Comments      Exercises General Exercises - Lower Extremity Ankle Circles/Pumps: AROM;Both;10 reps;Supine Long Arc Quad: AROM;Both;10 reps;Seated Other Exercises Other Exercises: scapular squeezes X5 with multimodal cues   Assessment/Plan    PT Assessment Patient needs continued PT services  PT Problem List Decreased strength;Decreased mobility;Decreased activity tolerance;Decreased balance;Decreased coordination;Decreased range of motion;Obesity;Decreased cognition       PT Treatment Interventions DME instruction;Gait training;Functional mobility training;Therapeutic activities;Patient/family education;Cognitive remediation;Neuromuscular re-education;Balance training;Therapeutic exercise    PT Goals (Current goals can be found in the Care Plan section)  Acute Rehab PT Goals Patient Stated Goal: to go home today PT Goal Formulation: With patient/family Time For Goal Achievement: 10/06/20 Potential to Achieve Goals: Fair    Frequency Min 2X/week   Barriers to discharge        Co-evaluation               AM-PAC PT "6 Clicks" Mobility  Outcome Measure Help needed turning from your back to your side while in a flat bed without using bedrails?: A Lot Help needed moving from lying on your back to sitting on the side of a flat bed without using bedrails?: A Lot Help needed moving to and from a bed to a chair (including a wheelchair)?: Total Help needed standing up from a chair using your arms (e.g., wheelchair or bedside chair)?: Total Help needed to walk in hospital room?:  Total Help needed climbing 3-5 steps with a railing? : Total 6 Click Score: 8    End of Session Equipment Utilized During Treatment: Oxygen Activity Tolerance: Patient tolerated treatment well Patient left: in bed;with call bell/phone within reach;with bed alarm set Nurse Communication: Mobility status PT Visit Diagnosis: Other abnormalities of gait and mobility (R26.89);Muscle weakness (generalized) (M62.81);Difficulty in walking, not elsewhere classified (R26.2)    Time: 2119-4174 PT Time Calculation (min) (ACUTE ONLY): 17 min   Charges:   PT Evaluation $PT Re-evaluation: 1 Re-eval          Cindee Salt, DPT  Acute Rehabilitation Services  Pager: [phone] Office: [phone]   Lehman Prom 09/22/2020, 10:12 AM

## 2020-09-22 NOTE — Progress Notes (Signed)
SLP Cancellation Note  Patient Details Name: [name] MRN: [id number] DOB: 1951/03/19   Cancelled treatment:       Reason Eval/Treat Not Completed: Patient declined, no reason specified;Other (comment) (pt nauseated; RN administered medicine to assist)   Azan Maneri E Chinedu Agustin 09/22/2020, 12:49 PM

## 2020-09-22 NOTE — Progress Notes (Signed)
Inpatient Diabetes Program Recommendations  AACE/ADA: New Consensus Statement on Inpatient Glycemic Control (2015)  Target Ranges:  Prepandial:   less than 140 mg/dL      Peak postprandial:   less than 180 mg/dL (1-2 hours)      Critically ill patients:  140 - 180 mg/dL   Lab Results  Component Value Date   GLUCAP 86 09/19/2020   HGBA1C 7.9 (H) 08/24/2020    Review of Glycemic Control  Diabetes history: DM 2 Outpatient Diabetes medications: Glipizide 10 mg Daily, Lantus 20-30 units qhs Current orders for Inpatient glycemic control:  Lantus 10 units Daily Novolog 0-6 units tid  Ensure Enlive tid between meals 7.9% on 2/21  Glucose trends per CGM: 3/21 0817     109 1233     158 1600     175 2002     182  3/22 0755     124   Watch trends for now  Thanks,  Christena Deem RN, MSN, BC-ADM Inpatient Diabetes Coordinator Team Pager [phone] (8a-5p)

## 2020-09-22 NOTE — TOC Progression Note (Addendum)
Transition of Care [facility]) - Progression Note    Patient Details  Name: [name] MRN: [id number] Date of Birth: 1951-01-24  Transition of Care [facility]) CM/SW Contact  Janae Bridgeman, RN Phone Number: 09/22/2020, 9:42 AM  Clinical Narrative:    Case management spoke with Elza Rafter, RNCM at [facility] and asked for home health services if able to provide for the patient. I sent clinicals to [facility] health fax [phone] and secure email to [email or web address].   I have been unable to secure home services in her area due to being out of network with her insurance provider or agencies have been unwilling to accept the patient due to short staffing for PT/RN services. I reached out to [place], [facility] again today and will follow up with the agency this morning after her PT visit today.  The patient is pending discharge home today once the patient's hospital bed arrives and patient receives PT visit today.    CM will follow the patient for discharge to home today vis [facility] at daughter's request.  09/22/2020 1058 - Cm spoke with Kandee Keen, [facility] with [facility] and they are willing to accept the patient for services - including Pt, OT, aide, and MSW.  RN services were unavailable in the patient's area due to staffing shortages.  I spoke with the patient's daughter and updated her on discharge planning services for home.  [facility] will be called for transport to home after speaking with the daughter.  CM spoke with the daughter at the bedside and the hospital bed should arrive to the home in the next 1-2 hours.  I called [facility] and scheduled ambulance transport for 3pm today.  I spoke with the daughter and updated her on the transport time for this afternoon to allow for the dme to be delivered. I called Kayla,RN and updated her on the updated AVS information for discharge noted in the discharge instructions and [facility] transport scheduled for 3 pm today.[facility]  packet was placed in the chart at the secretary's desk and RN is aware.   Expected Discharge Plan: Home w Home Health Services Barriers to Discharge: Continued Medical Work up  Expected Discharge Plan and Services Expected Discharge Plan: Home w Home Health Services In-house Referral: Clinical Social Work Discharge Planning Services: CM Consult Post Acute Care Choice: Durable Medical Equipment,Home Health Living arrangements for the past 2 months: Single Family Home Expected Discharge Date: 09/21/20               DME Arranged: Hospital bed,Wheelchair manual Michiel Sites lift) DME Agency: [facility] ([facility] will be providing dme for home.) Date DME Agency Contacted: 09/21/20 Time DME Agency Contacted: [phone] Representative spoke with at DME Agency: Noreene Larsson, CM at [facility] Agency: [facility]     Representative spoke with at [facility] Agency: CM is reaching out to multiple home health agencies to provide home health services   Social Determinants of Health (SDOH) Interventions    Readmission Risk Interventions Readmission Risk Prevention Plan 09/21/2020 09/16/2020  Transportation Screening Complete Complete  Medication Review Oceanographer) Complete Complete  PCP or Specialist appointment within 3-5 days of discharge Complete Complete  HRI or Home Care Consult Complete Complete  SW Recovery Care/Counseling Consult Complete Complete  Palliative Care Screening Complete Complete  Skilled Nursing Facility Complete Complete  Some recent data might be hidden

## 2020-09-23 NOTE — Progress Notes (Signed)
Transport arrived to take pt home. Daughter took home all belongings. Pt transferred to stretcher, she was stable at time of discharge.

## 2020-09-25 DIAGNOSIS — I130 Hypertensive heart and chronic kidney disease with heart failure and stage 1 through stage 4 chronic kidney disease, or unspecified chronic kidney disease: Secondary | ICD-10-CM | POA: Diagnosis not present

## 2020-09-25 DIAGNOSIS — J9601 Acute respiratory failure with hypoxia: Secondary | ICD-10-CM | POA: Diagnosis not present

## 2020-09-25 DIAGNOSIS — M47814 Spondylosis without myelopathy or radiculopathy, thoracic region: Secondary | ICD-10-CM | POA: Diagnosis not present

## 2020-09-25 DIAGNOSIS — F0390 Unspecified dementia without behavioral disturbance: Secondary | ICD-10-CM | POA: Diagnosis not present

## 2020-09-25 DIAGNOSIS — D631 Anemia in chronic kidney disease: Secondary | ICD-10-CM | POA: Diagnosis not present

## 2020-09-25 DIAGNOSIS — G319 Degenerative disease of nervous system, unspecified: Secondary | ICD-10-CM | POA: Diagnosis not present

## 2020-09-25 DIAGNOSIS — I272 Pulmonary hypertension, unspecified: Secondary | ICD-10-CM | POA: Diagnosis not present

## 2020-09-25 DIAGNOSIS — K2270 Barrett's esophagus without dysplasia: Secondary | ICD-10-CM | POA: Diagnosis not present

## 2020-09-25 DIAGNOSIS — K3184 Gastroparesis: Secondary | ICD-10-CM | POA: Diagnosis not present

## 2020-09-25 DIAGNOSIS — I480 Paroxysmal atrial fibrillation: Secondary | ICD-10-CM | POA: Diagnosis not present

## 2020-09-25 DIAGNOSIS — M47816 Spondylosis without myelopathy or radiculopathy, lumbar region: Secondary | ICD-10-CM | POA: Diagnosis not present

## 2020-09-25 DIAGNOSIS — I083 Combined rheumatic disorders of mitral, aortic and tricuspid valves: Secondary | ICD-10-CM | POA: Diagnosis not present

## 2020-09-25 DIAGNOSIS — G40909 Epilepsy, unspecified, not intractable, without status epilepticus: Secondary | ICD-10-CM | POA: Diagnosis not present

## 2020-09-25 DIAGNOSIS — N1831 Chronic kidney disease, stage 3a: Secondary | ICD-10-CM | POA: Diagnosis not present

## 2020-09-25 DIAGNOSIS — J9811 Atelectasis: Secondary | ICD-10-CM | POA: Diagnosis not present

## 2020-09-25 DIAGNOSIS — E1122 Type 2 diabetes mellitus with diabetic chronic kidney disease: Secondary | ICD-10-CM | POA: Diagnosis not present

## 2020-09-25 DIAGNOSIS — E039 Hypothyroidism, unspecified: Secondary | ICD-10-CM | POA: Diagnosis not present

## 2020-09-25 DIAGNOSIS — E1143 Type 2 diabetes mellitus with diabetic autonomic (poly)neuropathy: Secondary | ICD-10-CM | POA: Diagnosis not present

## 2020-09-25 DIAGNOSIS — M47812 Spondylosis without myelopathy or radiculopathy, cervical region: Secondary | ICD-10-CM | POA: Diagnosis not present

## 2020-09-25 DIAGNOSIS — M2578 Osteophyte, vertebrae: Secondary | ICD-10-CM | POA: Diagnosis not present

## 2020-09-25 DIAGNOSIS — H539 Unspecified visual disturbance: Secondary | ICD-10-CM | POA: Diagnosis not present

## 2020-09-25 DIAGNOSIS — M159 Polyosteoarthritis, unspecified: Secondary | ICD-10-CM | POA: Diagnosis not present

## 2020-09-25 DIAGNOSIS — M4802 Spinal stenosis, cervical region: Secondary | ICD-10-CM | POA: Diagnosis not present

## 2020-09-25 DIAGNOSIS — I5042 Chronic combined systolic (congestive) and diastolic (congestive) heart failure: Secondary | ICD-10-CM | POA: Diagnosis not present

## 2020-09-25 DIAGNOSIS — M797 Fibromyalgia: Secondary | ICD-10-CM | POA: Diagnosis not present

## 2020-09-28 ENCOUNTER — Telehealth: Payer: Self-pay | Admitting: Cardiology

## 2020-09-28 DIAGNOSIS — J189 Pneumonia, unspecified organism: Secondary | ICD-10-CM | POA: Diagnosis not present

## 2020-09-28 DIAGNOSIS — I5032 Chronic diastolic (congestive) heart failure: Secondary | ICD-10-CM | POA: Diagnosis not present

## 2020-09-28 DIAGNOSIS — R531 Weakness: Secondary | ICD-10-CM | POA: Diagnosis not present

## 2020-09-28 DIAGNOSIS — I4891 Unspecified atrial fibrillation: Secondary | ICD-10-CM | POA: Diagnosis not present

## 2020-09-28 NOTE — Telephone Encounter (Signed)
  Patient Consent for Virtual Visit         [name] has provided verbal consent on 09/28/2020 for a virtual visit (video or telephone).   CONSENT FOR VIRTUAL VISIT FOR:  [name]  By participating in this virtual visit I agree to the following:  I hereby voluntarily request, consent and authorize [facility] and its employed or contracted physicians, physician assistants, nurse practitioners or other licensed health care professionals (the Practitioner), to provide me with telemedicine health care services (the "Services") as deemed necessary by the treating Practitioner. I acknowledge and consent to receive the Services by the Practitioner via telemedicine. I understand that the telemedicine visit will involve communicating with the Practitioner through live audiovisual communication technology and the disclosure of certain medical information by electronic transmission. I acknowledge that I have been given the opportunity to request an in-person assessment or other available alternative prior to the telemedicine visit and am voluntarily participating in the telemedicine visit.  I understand that I have the right to withhold or withdraw my consent to the use of telemedicine in the course of my care at any time, without affecting my right to future care or treatment, and that the Practitioner or I may terminate the telemedicine visit at any time. I understand that I have the right to inspect all information obtained and/or recorded in the course of the telemedicine visit and may receive copies of available information for a reasonable fee.  I understand that some of the potential risks of receiving the Services via telemedicine include:  Marland Kitchen Delay or interruption in medical evaluation due to technological equipment failure or disruption; . Information transmitted may not be sufficient (e.g. poor resolution of images) to allow for appropriate medical decision making by the Practitioner;  and/or  . In rare instances, security protocols could fail, causing a breach of personal health information.  Furthermore, I acknowledge that it is my responsibility to provide information about my medical history, conditions and care that is complete and accurate to the best of my ability. I acknowledge that Practitioner's advice, recommendations, and/or decision may be based on factors not within their control, such as incomplete or inaccurate data provided by me or distortions of diagnostic images or specimens that may result from electronic transmissions. I understand that the practice of medicine is not an exact science and that Practitioner makes no warranties or guarantees regarding treatment outcomes. I acknowledge that a copy of this consent can be made available to me via my patient portal [facility] MyChart), or I can request a printed copy by calling the office of [facility].    I understand that my insurance will be billed for this visit.   I have read or had this consent read to me. . I understand the contents of this consent, which adequately explains the benefits and risks of the Services being provided via telemedicine.  . I have been provided ample opportunity to ask questions regarding this consent and the Services and have had my questions answered to my satisfaction. . I give my informed consent for the services to be provided through the use of telemedicine in my medical care

## 2020-09-30 DIAGNOSIS — G309 Alzheimer's disease, unspecified: Secondary | ICD-10-CM | POA: Diagnosis not present

## 2020-09-30 DIAGNOSIS — M797 Fibromyalgia: Secondary | ICD-10-CM | POA: Diagnosis not present

## 2020-09-30 DIAGNOSIS — M130 Polyarthritis, unspecified: Secondary | ICD-10-CM | POA: Diagnosis not present

## 2020-09-30 DIAGNOSIS — M25569 Pain in unspecified knee: Secondary | ICD-10-CM | POA: Diagnosis not present

## 2020-09-30 DIAGNOSIS — R569 Unspecified convulsions: Secondary | ICD-10-CM | POA: Diagnosis not present

## 2020-09-30 DIAGNOSIS — M545 Low back pain, unspecified: Secondary | ICD-10-CM | POA: Diagnosis not present

## 2020-09-30 DIAGNOSIS — R413 Other amnesia: Secondary | ICD-10-CM | POA: Diagnosis not present

## 2020-10-01 ENCOUNTER — Telehealth: Payer: PPO | Admitting: Cardiology

## 2020-10-01 NOTE — Progress Notes (Deleted)
Clinical Summary Ms. Racey is a 70 y.o.female seen today for follow up of the following medical problems.   1. Chronicsystolic diastolic CHF with mixed precap and post postcap Pulmonary Hypertension - echo 12/2013 PASP 85, moderate TR, RV mild to moderately dilated with normal function, could not eval diasotlic function due to afib, LVEF 50-55%. Biatrial enlargement.  - RHC 01/2015 PA 38/13 and calculated mean of 21, could not get a wedge however LVEDP 15. - repeat echo 10/2014 PASP 61 mmHg (down from 85 on prior echo), some evidence of RV dysfunction with RV TAPSE 1.4 and tissue anular systolic velocity of 7.5. Cannot evaluate diastolic function, however severe biatrial enlarement suggests significant dysfunction, - 01/2015 RHC mean PA 52, PCWP 31 - 01/2015 echo LVEF 55-60%, PASP 46  - after most recent RHC in 01/2015 she was admitted for diuresis. Discharge weight 170 lbs, reportedly down from 190 on admission. Marland Kitchen Discharged on lasix 60mg  bid.     - last visit we increased torsemide to 40mg  bid - repeat labs showed stable renal function - weight is down 232 lbs. Still some swelling right leg which is chronic, no abdominal distension. No significant SOB/DOE   2. Pneumonia/Left mainstem mucous plugging - recent admission 09/2020 with pneumonia and septic shock - required intubation, bronchoscopy  3. PAF -  SH: completed her covid vaccine x2.    Past Medical History:  Diagnosis Date  . Abnormal pulmonary function test   . Anemia    H/H of 10/30 with a normal MCV in 12/09  . Anxiety   . Arthritis   . Barrett's esophagus    Diagnosed 1995. Last EGD 2016-NO BARRETT'S.   . Chest pain    Negative cardiac catheterization in 2002; negative stress nuclear study in 2008  . Chronic anticoagulation   . Chronic combined systolic and diastolic CHF (congestive heart failure) (HCC)    a. EF predominantly normal during prior echoes but was 40% during 10/2014 echo. b. Most recent  01/2015 EF normal, 55-60%.  . Chronic LBP    Surgical intervention in 1996  . Diabetes mellitus, type 2 (HCC)    Insulin therapy; exacerbated by prednisone  . Dysrhythmia    AFib  . Gastroparesis    99% retention 05/2008 on GES  . GERD (gastroesophageal reflux disease)   . Hiatal hernia   . Hyperlipidemia   . Hypertension   . Hypothyroid   . IBS (irritable bowel syndrome)   . Obesity   . OSA on CPAP    had CPAP and cannot tolerate.  . Paroxysmal atrial fibrillation (HCC)   . Pulmonary hypertension (HCC) 01/2015   a. Predominantly pulmonary venous hypertension but may be component of PAH.  . Seizures (HCC)    last seizure was 2 years ago; on keppra for this; unknown etiology  . Syncope    a. Admitted 05/2009; magnetic resonance imagin/ MRA - negative; etiology thought to be orthostasis secondary to drugs and dehydration. b. Syncope 02/2015 also felt 2/2 dehydration.     Allergies  Allergen Reactions  . Citalopram Hydrobromide Other (See Comments)    Dyskinesia Other reaction(s): Other (See Comments) Dyskinesia  . Codeine Nausea And Vomiting and Other (See Comments)    HALLUCINATIONS Other reaction(s): Unknown Other reaction(s): Other (See Comments) HALLUCINATIONS   . Hydromorphone Hcl Other (See Comments)    Made her pass out Other reaction(s): Other (See Comments) Made her pass out  . Metoclopramide Other (See Comments)    DYSKINESIA Other  reaction(s): Other (See Comments) DYSKINESIA  . Hyoscyamine     MADE DIARRHEA WORSE     Current Outpatient Medications  Medication Sig Dispense Refill  . acetaminophen (TYLENOL) 500 MG tablet Take 500 mg by mouth as needed for moderate pain or headache.     Marland Kitchen amiodarone (PACERONE) 200 MG tablet TAKE 1 TABLET BY MOUTH EVERY MORNING 30 tablet 9  . apixaban (ELIQUIS) 5 MG TABS tablet Take 1 tablet (5 mg total) by mouth 2 (two) times daily. 60 tablet 11  . atorvastatin (LIPITOR) 80 MG tablet Place 1 tablet (80 mg total) into  feeding tube daily. 30 tablet 3  . Cholecalciferol (VITAMIN D3) 2000 units capsule Take 2,000 Units by mouth daily.  11  . Cyanocobalamin 5000 MCG SUBL Take 1 tablet by mouth daily.    Marland Kitchen donepezil (ARICEPT) 5 MG tablet Take 5 mg by mouth daily.    . DULoxetine (CYMBALTA) 30 MG capsule Take 30 mg by mouth 2 (two) times daily.  0  . feeding supplement (ENSURE ENLIVE / ENSURE PLUS) LIQD Take 237 mLs by mouth 3 (three) times daily between meals. 237 mL 12  . furosemide (LASIX) 20 MG tablet Take 1 tablet (20 mg total) by mouth daily. 30 tablet 3  . gabapentin (NEURONTIN) 100 MG capsule Take 1 capsule (100 mg total) by mouth 3 (three) times daily. 90 capsule 3  . insulin glargine (LANTUS) 100 UNIT/ML injection Inject 0.1 mLs (10 Units total) into the skin daily. 10 mL 11  . Lactase 9000 units CHEW 1 PO Q4H WHEN YOU EAT ICE CREAM OR CHEESE (Patient taking differently: Chew 1 tablet by mouth every 4 (four) hours as needed (when eating dairy products).) 90 tablet 11  . levETIRAcetam (KEPPRA) 500 MG tablet Take 2 tablets (1,000 mg total) by mouth 2 (two) times daily. 90 tablet 3  . levothyroxine (SYNTHROID) 175 MCG tablet Take 1 tablet by mouth daily.    Marland Kitchen loperamide (IMODIUM) 2 MG capsule 1 PO Q4H PRN LOOSE STOOLS (Patient taking differently: Take 4 mg by mouth every 4 (four) hours as needed. 1 PO Q4H PRN LOOSE STOOLS) 120 capsule 11  . magnesium oxide (MAG-OX) 400 MG tablet Take 1 tablet (400 mg total) by mouth 2 (two) times daily. 180 tablet 1  . metoprolol tartrate (LOPRESSOR) 25 MG tablet Take 0.5 tablets (12.5 mg total) by mouth 2 (two) times daily. 30 tablet 3  . Multiple Vitamin (MULTIVITAMIN WITH MINERALS) TABS tablet Take 1 tablet by mouth daily.    . ondansetron (ZOFRAN) 4 MG tablet TAKE 1 TABLET BY MOUTH FOUR TIMES DAILY AS NEEDED FOR NAUSEA (Patient taking differently: Take 4 mg by mouth 4 (four) times daily as needed for nausea.) 120 tablet 1  . OXYGEN 2 L daily.     . pantoprazole (PROTONIX)  40 MG tablet TAKE ONE TABLET BY MOUTH TWICE DAILY BEFORE APPLY MEAL (Patient taking differently: Take 40 mg by mouth 2 (two) times daily.) 180 tablet 3  . potassium chloride (KLOR-CON) 10 MEQ tablet Take 20 mEq by mouth daily.    Marland Kitchen SM MELATONIN 3 MG TABS tablet Take 3 mg by mouth at bedtime as needed for sleep.     No current facility-administered medications for this visit.     Past Surgical History:  Procedure Laterality Date  . BACK SURGERY  1996  . BIOPSY N/A 11/08/2013   Procedure: BIOPSY  / Tissue sampling / ulcers present in small intestine;  Surgeon: West Bali, MD;  Location: [facility];  Service: Endoscopy;  Laterality: N/A;  . CARDIAC CATHETERIZATION  2002  . CARDIAC CATHETERIZATION N/A 01/26/2015   Procedure: Right Heart Cath;  Surgeon: Laurey Morale, MD;  Location: [facility] INVASIVE CV LAB;  Service: Cardiovascular;  Laterality: N/A;  . CARDIOVASCULAR STRESS TEST  2008   Stress nuclear study  . CARDIOVERSION N/A 03/06/2015   Procedure: CARDIOVERSION;  Surgeon: Laurey Morale, MD;  Location: [facility] ENDOSCOPY;  Service: Cardiovascular;  Laterality: N/A;  . CARPAL TUNNEL RELEASE  1994  . COLONOSCOPY  11/2011   Dr. Darrick Penna: Internal hemorrhoids, mild diverticulosis. Random colon biopsies negative.  . COLONOSCOPY N/A 11/08/2013   SLF: Normal mucosa in the terminal ileum/The colon IS redundant/  Moderate diverticulosis throughout the entire colon. ileum bx benign. colon bx benign  . ESOPHAGOGASTRODUODENOSCOPY  2008   Barrett's without dysplasia. esphagus dilated. antral erosions, h.pylori serologies negative.  . ESOPHAGOGASTRODUODENOSCOPY  11/2011   Dr. Darrick Penna: Barrett's esophagus, mild gastritis, diverticulum in the second portion of the duodenum repeat EGD 3 years. Small bowel biopsies negative. Gastric biopsy show reactive gastropathy but no H. pylori. Esophageal biopsies consistent with GERD. Next EGD 11/2014  . ESOPHAGOGASTRODUODENOSCOPY N/A 11/21/2014   MVE:HMCN non-erosive  gastritis/irregular z-line  . GIVENS CAPSULE STUDY  12/07/2011   Proximal small bowel, rare AVM. Distal small bowel, multiple ulcers noted  . GIVENS CAPSULE STUDY N/A 09/27/2013   Distal small bowel ulcers extending to TI.  Marland Kitchen GIVENS CAPSULE STUDY N/A 10/10/2013   Procedure: GIVENS CAPSULE STUDY;  Surgeon: West Bali, MD;  Location: [facility];  Service: Endoscopy;  Laterality: N/A;  7:30  . IR KYPHO THORACIC WITH BONE BIOPSY  02/09/2018  . KNEE ARTHROSCOPY WITH MEDIAL MENISECTOMY Right 06/09/2016   Procedure: KNEE ARTHROSCOPY WITH MEDIAL MENISECTOMY;  Surgeon: Vickki Hearing, MD;  Location: [facility]S;  Service: Orthopedics;  Laterality: Right;  medial and lateral menisectomy  . LAMINECTOMY  1995   L4-L5  . LAPAROSCOPIC CHOLECYSTECTOMY  1990s  . LEFT HEART CATHETERIZATION WITH CORONARY ANGIOGRAM  01/10/2014   Procedure: LEFT HEART CATHETERIZATION WITH CORONARY ANGIOGRAM;  Surgeon: Lesleigh Noe, MD;  Location: [facility] CATH LAB;  Service: Cardiovascular;;  . RIGHT HEART CATHETERIZATION N/A 01/10/2014   Procedure: RIGHT HEART CATH;  Surgeon: Lesleigh Noe, MD;  Location: [facility] CATH LAB;  Service: Cardiovascular;  Laterality: N/A;  . TOTAL ABDOMINAL HYSTERECTOMY  1999  . TRACHEOSTOMY TUBE PLACEMENT N/A 09/01/2017   Procedure: TRACHEOSTOMY;  Surgeon: Drema Halon, MD;  Location: [facility] OR;  Service: ENT;  Laterality: N/A;     Allergies  Allergen Reactions  . Citalopram Hydrobromide Other (See Comments)    Dyskinesia Other reaction(s): Other (See Comments) Dyskinesia  . Codeine Nausea And Vomiting and Other (See Comments)    HALLUCINATIONS Other reaction(s): Unknown Other reaction(s): Other (See Comments) HALLUCINATIONS   . Hydromorphone Hcl Other (See Comments)    Made her pass out Other reaction(s): Other (See Comments) Made her pass out  . Metoclopramide Other (See Comments)    DYSKINESIA Other reaction(s): Other (See Comments) DYSKINESIA  . Hyoscyamine     MADE DIARRHEA  WORSE      Family History  Problem Relation Age of Onset  . Hypertension Mother   . Alzheimer's disease Mother   . Stroke Mother   . Heart attack Mother   . Hypertension Other   . Breast cancer Sister   . Heart disease Neg Hx   . Colon cancer Neg Hx  Social History Ms. Quirindongo reports that she quit smoking about 37 years ago. Her smoking use included cigarettes. She started smoking about 54 years ago. She has a 3.75 pack-year smoking history. She has never used smokeless tobacco. Ms. Lince reports no history of alcohol use.   Review of Systems CONSTITUTIONAL: No weight loss, fever, chills, weakness or fatigue.  HEENT: Eyes: No visual loss, blurred vision, double vision or yellow sclerae.No hearing loss, sneezing, congestion, runny nose or sore throat.  SKIN: No rash or itching.  CARDIOVASCULAR:  RESPIRATORY: No shortness of breath, cough or sputum.  GASTROINTESTINAL: No anorexia, nausea, vomiting or diarrhea. No abdominal pain or blood.  GENITOURINARY: No burning on urination, no polyuria NEUROLOGICAL: No headache, dizziness, syncope, paralysis, ataxia, numbness or tingling in the extremities. No change in bowel or bladder control.  MUSCULOSKELETAL: No muscle, back pain, joint pain or stiffness.  LYMPHATICS: No enlarged nodes. No history of splenectomy.  PSYCHIATRIC: No history of depression or anxiety.  ENDOCRINOLOGIC: No reports of sweating, cold or heat intolerance. No polyuria or polydipsia.  Marland Kitchen   Physical Examination There were no vitals filed for this visit. There were no vitals filed for this visit.  Gen: resting comfortably, no acute distress HEENT: no scleral icterus, pupils equal round and reactive, no palptable cervical adenopathy,  CV Resp: Clear to auscultation bilaterally GI: abdomen is soft, non-tender, non-distended, normal bowel sounds, no hepatosplenomegaly MSK: extremities are warm, no edema.  Skin: warm, no rash Neuro:  no focal  deficits Psych: appropriate affect   Diagnostic Studies  08/2020 echo IMPRESSIONS    1. Left ventricular ejection fraction, by estimation, is 50 to 55%. The  left ventricle has normal function. The left ventricle has no regional  wall motion abnormalities. There is severe left ventricular hypertrophy.  Left ventricular diastolic parameters  are indeterminate.  2. Right ventricular systolic function is mildly reduced. The right  ventricular size is mildly enlarged.  3. Left atrial size was severely dilated.  4. Right atrial size was severely dilated.  5. The mitral valve is normal in structure. Mild mitral valve  regurgitation. No evidence of mitral stenosis.  6. The tricuspid valve is abnormal. Tricuspid valve regurgitation is  moderate.  7. The aortic valve is tricuspid. Aortic valve regurgitation is not  visualized. No aortic stenosis is present.  8. The inferior vena cava is normal in size with <50% respiratory  variability, suggesting right atrial pressure of 8 mmHg.    Assessment and Plan  1. Chronic diastolic heart failure -weights down 9 lbs since changing to torsemide 40mg  bid. Some ongonig LE edema primarily right leg which is chronic. Labs were stable, conitnue current diuretic dose and repeat BMET/Mg in 2 weeks      Antoine Poche, M.D., F.A.C.C.

## 2020-10-02 ENCOUNTER — Encounter: Payer: Self-pay | Admitting: Cardiology

## 2020-10-02 DIAGNOSIS — M47814 Spondylosis without myelopathy or radiculopathy, thoracic region: Secondary | ICD-10-CM | POA: Diagnosis not present

## 2020-10-02 DIAGNOSIS — D631 Anemia in chronic kidney disease: Secondary | ICD-10-CM | POA: Diagnosis not present

## 2020-10-02 DIAGNOSIS — M159 Polyosteoarthritis, unspecified: Secondary | ICD-10-CM | POA: Diagnosis not present

## 2020-10-02 DIAGNOSIS — M4802 Spinal stenosis, cervical region: Secondary | ICD-10-CM | POA: Diagnosis not present

## 2020-10-02 DIAGNOSIS — K3184 Gastroparesis: Secondary | ICD-10-CM | POA: Diagnosis not present

## 2020-10-02 DIAGNOSIS — I130 Hypertensive heart and chronic kidney disease with heart failure and stage 1 through stage 4 chronic kidney disease, or unspecified chronic kidney disease: Secondary | ICD-10-CM | POA: Diagnosis not present

## 2020-10-02 DIAGNOSIS — N1831 Chronic kidney disease, stage 3a: Secondary | ICD-10-CM | POA: Diagnosis not present

## 2020-10-02 DIAGNOSIS — M47812 Spondylosis without myelopathy or radiculopathy, cervical region: Secondary | ICD-10-CM | POA: Diagnosis not present

## 2020-10-02 DIAGNOSIS — M797 Fibromyalgia: Secondary | ICD-10-CM | POA: Diagnosis not present

## 2020-10-02 DIAGNOSIS — I272 Pulmonary hypertension, unspecified: Secondary | ICD-10-CM | POA: Diagnosis not present

## 2020-10-02 DIAGNOSIS — I5042 Chronic combined systolic (congestive) and diastolic (congestive) heart failure: Secondary | ICD-10-CM | POA: Diagnosis not present

## 2020-10-02 DIAGNOSIS — M2578 Osteophyte, vertebrae: Secondary | ICD-10-CM | POA: Diagnosis not present

## 2020-10-02 DIAGNOSIS — K2270 Barrett's esophagus without dysplasia: Secondary | ICD-10-CM | POA: Diagnosis not present

## 2020-10-02 DIAGNOSIS — I480 Paroxysmal atrial fibrillation: Secondary | ICD-10-CM | POA: Diagnosis not present

## 2020-10-02 DIAGNOSIS — J9811 Atelectasis: Secondary | ICD-10-CM | POA: Diagnosis not present

## 2020-10-02 DIAGNOSIS — G319 Degenerative disease of nervous system, unspecified: Secondary | ICD-10-CM | POA: Diagnosis not present

## 2020-10-02 DIAGNOSIS — I083 Combined rheumatic disorders of mitral, aortic and tricuspid valves: Secondary | ICD-10-CM | POA: Diagnosis not present

## 2020-10-02 DIAGNOSIS — E1143 Type 2 diabetes mellitus with diabetic autonomic (poly)neuropathy: Secondary | ICD-10-CM | POA: Diagnosis not present

## 2020-10-02 DIAGNOSIS — E1122 Type 2 diabetes mellitus with diabetic chronic kidney disease: Secondary | ICD-10-CM | POA: Diagnosis not present

## 2020-10-02 DIAGNOSIS — G40909 Epilepsy, unspecified, not intractable, without status epilepticus: Secondary | ICD-10-CM | POA: Diagnosis not present

## 2020-10-02 DIAGNOSIS — F0390 Unspecified dementia without behavioral disturbance: Secondary | ICD-10-CM | POA: Diagnosis not present

## 2020-10-02 DIAGNOSIS — H539 Unspecified visual disturbance: Secondary | ICD-10-CM | POA: Diagnosis not present

## 2020-10-02 DIAGNOSIS — E039 Hypothyroidism, unspecified: Secondary | ICD-10-CM | POA: Diagnosis not present

## 2020-10-02 DIAGNOSIS — J9601 Acute respiratory failure with hypoxia: Secondary | ICD-10-CM | POA: Diagnosis not present

## 2020-10-02 DIAGNOSIS — M47816 Spondylosis without myelopathy or radiculopathy, lumbar region: Secondary | ICD-10-CM | POA: Diagnosis not present

## 2020-10-03 DIAGNOSIS — R269 Unspecified abnormalities of gait and mobility: Secondary | ICD-10-CM | POA: Diagnosis not present

## 2020-10-03 DIAGNOSIS — I5032 Chronic diastolic (congestive) heart failure: Secondary | ICD-10-CM | POA: Diagnosis not present

## 2020-10-05 DIAGNOSIS — J9620 Acute and chronic respiratory failure, unspecified whether with hypoxia or hypercapnia: Secondary | ICD-10-CM | POA: Diagnosis not present

## 2020-10-05 DIAGNOSIS — I482 Chronic atrial fibrillation, unspecified: Secondary | ICD-10-CM | POA: Diagnosis not present

## 2020-10-05 DIAGNOSIS — I5020 Unspecified systolic (congestive) heart failure: Secondary | ICD-10-CM | POA: Diagnosis not present

## 2020-10-05 DIAGNOSIS — J181 Lobar pneumonia, unspecified organism: Secondary | ICD-10-CM | POA: Diagnosis not present

## 2020-10-16 ENCOUNTER — Other Ambulatory Visit: Payer: Self-pay

## 2020-10-16 ENCOUNTER — Telehealth (INDEPENDENT_AMBULATORY_CARE_PROVIDER_SITE_OTHER): Payer: PPO | Admitting: Cardiology

## 2020-10-16 ENCOUNTER — Encounter: Payer: Self-pay | Admitting: Cardiology

## 2020-10-16 VITALS — BP 120/72 | HR 86 | Ht 66.0 in | Wt 217.0 lb

## 2020-10-16 DIAGNOSIS — I5032 Chronic diastolic (congestive) heart failure: Secondary | ICD-10-CM | POA: Diagnosis not present

## 2020-10-16 DIAGNOSIS — I480 Paroxysmal atrial fibrillation: Secondary | ICD-10-CM | POA: Diagnosis not present

## 2020-10-16 MED ORDER — MECLIZINE HCL 25 MG PO TABS: 25.0000 mg | ORAL_TABLET | Freq: Three times a day (TID) | ORAL | 1 refills | Status: DC | PRN

## 2020-10-16 NOTE — Progress Notes (Signed)
Virtual Visit via Telephone Note   This visit type was conducted due to national recommendations for restrictions regarding the COVID-19 Pandemic (e.g. social distancing) in an effort to limit this patient's exposure and mitigate transmission in our community.  Due to her co-morbid illnesses, this patient is at least at moderate risk for complications without adequate follow up.  This format is felt to be most appropriate for this patient at this time.  The patient did not have access to video technology/had technical difficulties with video requiring transitioning to audio format only (telephone).  All issues noted in this document were discussed and addressed.  No physical exam could be performed with this format.  Please refer to the patient's chart for her  consent to telehealth for [facility].    Date:  10/16/2020   ID:  [name], DOB 1950-11-24, MRN [id number] The patient was identified using 2 identifiers.  Patient Location: Home Provider Location: Office/Clinic   PCP:  Gareth Morgan, MD   [facility]  Cardiologist:  Dina Rich, MD  Advanced Practice Provider:  No care team member to display Electrophysiologist:  None   :[id number]}   Evaluation Performed:  Follow-Up Visit  Chief Complaint:  Follow up visit  History of Present Illness:    [name] is a 70 y.o. female seen today for follow up of the following medical problems.   1. Chronicsystolic diastolic CHF with mixed precap and post postcap Pulmonary Hypertension - echo 12/2013 PASP 85, moderate TR, RV mild to moderately dilated with normal function, could not eval diasotlic function due to afib, LVEF 50-55%. Biatrial enlargement.  - RHC 01/2015 PA 38/13 and calculated mean of 21, could not get a wedge however LVEDP 15. - repeat echo 10/2014 PASP 61 mmHg (down from 85 on prior echo), some evidence of RV dysfunction with RV TAPSE 1.4 and tissue anular systolic velocity of 7.5.  Cannot evaluate diastolic function, however severe biatrial enlarement suggests significant dysfunction, - 01/2015 RHC mean PA 52, PCWP 31 - 01/2015 echo LVEF 55-60%, PASP 46  - after most recent RHC in 01/2015 she was admitted for diuresis. Discharge weight 170 lbs, reportedly down from 190 on admission. Marland Kitchen Discharged on lasix 60mg  bid.      - was changed from torsemide to lasix furing 09/2020 admission - no recent SOB/DOE - denies any recent edema. Home weights stable 217 lbs which is her baseline. - in Feb very labile LV function likely transient stress induced CM  08/17/2020 echo [facility] LVEF 15-20% 08/20/2020 30 [facility]% 08/29/2020 [facility] LVEF 50-55%   2. Pneumonia - admission 09/2020 with pneumonia. Severe mucous plugging with left mainstem occlusion requiring intubation, bronch - admission comlicated by AMS,     3. Afib - amio started 02/2015.  - has had severe diffusion dfect on PFTs 10/2016prior  - no recent symptoms    4. OSA - could not tolerate CPAP      The patient does not have symptoms concerning for COVID-19 infection (fever, chills, cough, or new shortness of breath).    Past Medical History:  Diagnosis Date  . Abnormal pulmonary function test   . Anemia    H/H of 10/30 with a normal MCV in 12/09  . Anxiety   . Arthritis   . Barrett's esophagus    Diagnosed 1995. Last EGD 2016-NO BARRETT'S.   . Chest pain    Negative cardiac catheterization in 2002; negative stress nuclear study in 2008  .  Chronic anticoagulation   . Chronic combined systolic and diastolic CHF (congestive heart failure) (HCC)    a. EF predominantly normal during prior echoes but was 40% during 10/2014 echo. b. Most recent 01/2015 EF normal, 55-60%.  . Chronic LBP    Surgical intervention in 1996  . Diabetes mellitus, type 2 (HCC)    Insulin therapy; exacerbated by prednisone  . Dysrhythmia    AFib  . Gastroparesis    99% retention 05/2008 on GES  . GERD (gastroesophageal  reflux disease)   . Hiatal hernia   . Hyperlipidemia   . Hypertension   . Hypothyroid   . IBS (irritable bowel syndrome)   . Obesity   . OSA on CPAP    had CPAP and cannot tolerate.  . Paroxysmal atrial fibrillation (HCC)   . Pulmonary hypertension (HCC) 01/2015   a. Predominantly pulmonary venous hypertension but may be component of PAH.  . Seizures (HCC)    last seizure was 2 years ago; on keppra for this; unknown etiology  . Syncope    a. Admitted 05/2009; magnetic resonance imagin/ MRA - negative; etiology thought to be orthostasis secondary to drugs and dehydration. b. Syncope 02/2015 also felt 2/2 dehydration.   Past Surgical History:  Procedure Laterality Date  . BACK SURGERY  1996  . BIOPSY N/A 11/08/2013   Procedure: BIOPSY  / Tissue sampling / ulcers present in small intestine;  Surgeon: West Bali, MD;  [facility];  Service: Endoscopy;  Laterality: N/A;  . CARDIAC CATHETERIZATION  2002  . CARDIAC CATHETERIZATION N/A 01/26/2015   Procedure: Right Heart Cath;  Surgeon: Laurey Morale, MD;  Location: [facility] INVASIVE CV LAB;  Service: Cardiovascular;  Laterality: N/A;  . CARDIOVASCULAR STRESS TEST  2008   Stress nuclear study  . CARDIOVERSION N/A 03/06/2015   Procedure: CARDIOVERSION;  Surgeon: Laurey Morale, MD;  Location: [facility] ENDOSCOPY;  Service: Cardiovascular;  Laterality: N/A;  . CARPAL TUNNEL RELEASE  1994  . COLONOSCOPY  11/2011   Dr. Darrick Penna: Internal hemorrhoids, mild diverticulosis. Random colon biopsies negative.  . COLONOSCOPY N/A 11/08/2013   SLF: Normal mucosa in the terminal ileum/The colon IS redundant/  Moderate diverticulosis throughout the entire colon. ileum bx benign. colon bx benign  . ESOPHAGOGASTRODUODENOSCOPY  2008   Barrett's without dysplasia. esphagus dilated. antral erosions, h.pylori serologies negative.  . ESOPHAGOGASTRODUODENOSCOPY  11/2011   Dr. Darrick Penna: Barrett's esophagus, mild gastritis, diverticulum in the second portion of the  duodenum repeat EGD 3 years. Small bowel biopsies negative. Gastric biopsy show reactive gastropathy but no H. pylori. Esophageal biopsies consistent with GERD. Next EGD 11/2014  . ESOPHAGOGASTRODUODENOSCOPY N/A 11/21/2014   GYJ:EHUD non-erosive gastritis/irregular z-line  . GIVENS CAPSULE STUDY  12/07/2011   Proximal small bowel, rare AVM. Distal small bowel, multiple ulcers noted  . GIVENS CAPSULE STUDY N/A 09/27/2013   Distal small bowel ulcers extending to TI.  Marland Kitchen GIVENS CAPSULE STUDY N/A 10/10/2013   Procedure: GIVENS CAPSULE STUDY;  Surgeon: West Bali, MD;  Location: [facility];  Service: Endoscopy;  Laterality: N/A;  7:30  . IR KYPHO THORACIC WITH BONE BIOPSY  02/09/2018  . KNEE ARTHROSCOPY WITH MEDIAL MENISECTOMY Right 06/09/2016   Procedure: KNEE ARTHROSCOPY WITH MEDIAL MENISECTOMY;  Surgeon: Vickki Hearing, MD;  Location: [facility] ORS;  Service: Orthopedics;  Laterality: Right;  medial and lateral menisectomy  . LAMINECTOMY  1995   L4-L5  . LAPAROSCOPIC CHOLECYSTECTOMY  1990s  . LEFT HEART CATHETERIZATION WITH CORONARY ANGIOGRAM  01/10/2014   Procedure: LEFT HEART CATHETERIZATION WITH CORONARY ANGIOGRAM;  Surgeon: Lesleigh Noe, MD;  Location: [facility] CATH LAB;  Service: Cardiovascular;;  . RIGHT HEART CATHETERIZATION N/A 01/10/2014   Procedure: RIGHT HEART CATH;  Surgeon: Lesleigh Noe, MD;  Location: [facility] CATH LAB;  Service: Cardiovascular;  Laterality: N/A;  . TOTAL ABDOMINAL HYSTERECTOMY  1999  . TRACHEOSTOMY TUBE PLACEMENT N/A 09/01/2017   Procedure: TRACHEOSTOMY;  Surgeon: Drema Halon, MD;  Location: [facility] OR;  Service: ENT;  Laterality: N/A;     No outpatient medications have been marked as taking for the 10/16/20 encounter (Appointment) with Antoine Poche, MD.     Allergies:   Citalopram hydrobromide, Codeine, Hydromorphone hcl, Metoclopramide, and Hyoscyamine   Social History   Tobacco Use  . Smoking status: Former Smoker    Packs/day: 0.25    Years: 15.00     Pack years: 3.75    Types: Cigarettes    Start date: 02/26/1966    Quit date: 07/01/1983    Years since quitting: 37.3  . Smokeless tobacco: Never Used  . Tobacco comment: quit in 1984  Vaping Use  . Vaping Use: Never used  Substance Use Topics  . Alcohol use: No    Alcohol/week: 0.0 standard drinks  . Drug use: No     Family Hx: The patient's family history includes Alzheimer's disease in her mother; Breast cancer in her sister; Heart attack in her mother; Hypertension in her mother and another family member; Stroke in her mother. There is no history of Heart disease or Colon cancer.  ROS:   Please see the history of present illness.     All other systems reviewed and are negative.   Prior CV studies:   The following studies were reviewed today:  08/2020 cath Findings:  1. No significant coronary artery disease.  2. Elevated right and left ventricular filling pressures (LVEDP = 27 mm  Hg).  3. Severe pulmonary hypertension    08/2020 echo Summary  1. Echo contrast utilized to enhance endocardial border definition.  2. The left ventricle is normal in size with mildly increased wall  thickness.  3. The left ventricularsystolic function is severely decreased, LVEF is  visually estimated at 15-20%.  4. The left atrium is moderately dilated in size.  5. There is moderate tricuspid regurgitation.  6. There is severe pulmonary hypertension, estimated pulmonary artery  systolic pressure is 90 mmHg.  7. The right atrium is mildly to moderately dilated in size.  8. IVC size and inspiratory change suggest mildly elevated right atrial  pressure. (5-10 mmHg).    08/2020 Summary  1. The left ventricle is upper normal in size with mildly increased wall  thickness.  2. The left ventricular systolic function is severely decreased, LVEF is  visually estimated at 30%.  3. There is severe hypokinesis of the apical left ventricular segments with  normal  contractile function of the basilar segments, consistent with a stress  cardiomyopathy.  4. Mitral annular calcification is present (severe).  5. The mitral valve leaflets are mildly thickened with normal leaflet  mobility.  6. There is mild mitral valve regurgitation.  7. The aortic valve is probably trileaflet with mildly thickened leaflets  with normal excursion.  8. The left atrium is moderately dilated in size.  9. The right ventricle is upper normal in size, with normal systolic  function.  10. The right atrium is mildly dilated in size.     Labs/Other Tests  and Data Reviewed:    EKG:  n/a Recent Labs: 11/16/2019: B Natriuretic Peptide 263.0 08/26/2020: TSH 0.638 09/12/2020: ALT 21; Magnesium 2.0 09/17/2020: Hemoglobin 9.6; Platelets 183 09/20/2020: BUN 22; Creatinine, Ser 1.02; Potassium 3.8; Sodium 137   Recent Lipid Panel Lab Results  Component Value Date/Time   CHOL 149 08/29/2020 04:16 AM   TRIG 104 08/31/2020 02:49 AM   HDL 38 (L) 08/29/2020 04:16 AM   CHOLHDL 3.9 08/29/2020 04:16 AM   LDLCALC 88 08/29/2020 04:16 AM    Wt Readings from Last 3 Encounters:  09/22/20 214 lb 8.1 oz (97.3 kg)  06/17/20 229 lb 6.4 oz (104.1 kg)  03/17/20 237 lb 3.2 oz (107.6 kg)     Risk Assessment/Calculations:      Objective:    Vital Signs:   Today's Vitals   10/16/20 1428  BP: 120/72  Pulse: 86  SpO2: 97%  Weight: 217 lb (98.4 kg)  Height: 5\' 6"  (1.676 m)   Body mass index is 35.02 kg/m. Normal affect. Normal speech pattern and tone. Comfortable, no apparent distress. No audible signs of sob or wheezing.   ASSESSMENT & PLAN:    1. Chronic diastolic heart failure -from 08/2020 admissions to [facility] and [facility] transient severe LV dysfunction by [facility] echos, by her admission later in the month at [facility] LVEF had normalized - torsemide changed to lasix during admission, remains stable from fluid standpoint baselien weight around 217 lbs - continue current  mes  2. Afib - no symptoms, continue current meds     3. Vertigo - give Rx for prn meclizine   COVID-19 Education: The signs and symptoms of COVID-19 were discussed with the patient and how to seek care for testing (follow up with PCP or arrange E-visit).  The importance of social distancing was discussed today.  Time:   Today, I have spent 18 minutes with the patient with telehealth technology discussing the above problems.     Medication Adjustments/Labs and Tests Ordered: Current medicines are reviewed at length with the patient today.  Concerns regarding medicines are outlined above.   Tests Ordered: No orders of the defined types were placed in this encounter.   Medication Changes: No orders of the defined types were placed in this encounter.   Follow Up:  In Person in 3 month(s)  Signed, Dina Rich, MD  10/16/2020 1:01 PM    [facility]

## 2020-10-16 NOTE — Patient Instructions (Addendum)
Your physician recommends that you schedule a follow-up appointment in: 3 MONTHS WITH DR [facility]  Your physician has recommended you make the following change in your medication:   STOP METOPROLOL   START MECLIZINE 25 MG EVERY 8 HOURS AS NEEDED FOR DIZZINESS  Thank you for choosing [facility]!!

## 2020-10-19 DIAGNOSIS — M2578 Osteophyte, vertebrae: Secondary | ICD-10-CM | POA: Diagnosis not present

## 2020-10-19 DIAGNOSIS — H539 Unspecified visual disturbance: Secondary | ICD-10-CM | POA: Diagnosis not present

## 2020-10-19 DIAGNOSIS — K2270 Barrett's esophagus without dysplasia: Secondary | ICD-10-CM | POA: Diagnosis not present

## 2020-10-19 DIAGNOSIS — F0390 Unspecified dementia without behavioral disturbance: Secondary | ICD-10-CM | POA: Diagnosis not present

## 2020-10-19 DIAGNOSIS — M4802 Spinal stenosis, cervical region: Secondary | ICD-10-CM | POA: Diagnosis not present

## 2020-10-19 DIAGNOSIS — G319 Degenerative disease of nervous system, unspecified: Secondary | ICD-10-CM | POA: Diagnosis not present

## 2020-10-19 DIAGNOSIS — M797 Fibromyalgia: Secondary | ICD-10-CM | POA: Diagnosis not present

## 2020-10-19 DIAGNOSIS — I130 Hypertensive heart and chronic kidney disease with heart failure and stage 1 through stage 4 chronic kidney disease, or unspecified chronic kidney disease: Secondary | ICD-10-CM | POA: Diagnosis not present

## 2020-10-19 DIAGNOSIS — I5042 Chronic combined systolic (congestive) and diastolic (congestive) heart failure: Secondary | ICD-10-CM | POA: Diagnosis not present

## 2020-10-19 DIAGNOSIS — E1143 Type 2 diabetes mellitus with diabetic autonomic (poly)neuropathy: Secondary | ICD-10-CM | POA: Diagnosis not present

## 2020-10-19 DIAGNOSIS — M47812 Spondylosis without myelopathy or radiculopathy, cervical region: Secondary | ICD-10-CM | POA: Diagnosis not present

## 2020-10-19 DIAGNOSIS — G40909 Epilepsy, unspecified, not intractable, without status epilepticus: Secondary | ICD-10-CM | POA: Diagnosis not present

## 2020-10-19 DIAGNOSIS — J9811 Atelectasis: Secondary | ICD-10-CM | POA: Diagnosis not present

## 2020-10-19 DIAGNOSIS — I272 Pulmonary hypertension, unspecified: Secondary | ICD-10-CM | POA: Diagnosis not present

## 2020-10-19 DIAGNOSIS — M159 Polyosteoarthritis, unspecified: Secondary | ICD-10-CM | POA: Diagnosis not present

## 2020-10-19 DIAGNOSIS — I083 Combined rheumatic disorders of mitral, aortic and tricuspid valves: Secondary | ICD-10-CM | POA: Diagnosis not present

## 2020-10-19 DIAGNOSIS — D631 Anemia in chronic kidney disease: Secondary | ICD-10-CM | POA: Diagnosis not present

## 2020-10-19 DIAGNOSIS — J9601 Acute respiratory failure with hypoxia: Secondary | ICD-10-CM | POA: Diagnosis not present

## 2020-10-19 DIAGNOSIS — N1831 Chronic kidney disease, stage 3a: Secondary | ICD-10-CM | POA: Diagnosis not present

## 2020-10-19 DIAGNOSIS — K3184 Gastroparesis: Secondary | ICD-10-CM | POA: Diagnosis not present

## 2020-10-19 DIAGNOSIS — E039 Hypothyroidism, unspecified: Secondary | ICD-10-CM | POA: Diagnosis not present

## 2020-10-19 DIAGNOSIS — M47814 Spondylosis without myelopathy or radiculopathy, thoracic region: Secondary | ICD-10-CM | POA: Diagnosis not present

## 2020-10-19 DIAGNOSIS — E1122 Type 2 diabetes mellitus with diabetic chronic kidney disease: Secondary | ICD-10-CM | POA: Diagnosis not present

## 2020-10-19 DIAGNOSIS — M47816 Spondylosis without myelopathy or radiculopathy, lumbar region: Secondary | ICD-10-CM | POA: Diagnosis not present

## 2020-10-19 DIAGNOSIS — I480 Paroxysmal atrial fibrillation: Secondary | ICD-10-CM | POA: Diagnosis not present

## 2020-10-25 ENCOUNTER — Other Ambulatory Visit: Payer: Self-pay

## 2020-10-25 ENCOUNTER — Encounter (HOSPITAL_COMMUNITY): Payer: Self-pay | Admitting: Emergency Medicine

## 2020-10-25 ENCOUNTER — Emergency Department (HOSPITAL_COMMUNITY)
Admission: EM | Admit: 2020-10-25 | Discharge: 2020-10-26 | Disposition: A | Discharge status: Home / Self Care | Payer: PPO | Attending: Emergency Medicine | Admitting: Emergency Medicine

## 2020-10-25 ENCOUNTER — Emergency Department (HOSPITAL_COMMUNITY): Payer: PPO

## 2020-10-25 DIAGNOSIS — I5042 Chronic combined systolic (congestive) and diastolic (congestive) heart failure: Secondary | ICD-10-CM | POA: Insufficient documentation

## 2020-10-25 DIAGNOSIS — R0902 Hypoxemia: Secondary | ICD-10-CM | POA: Diagnosis not present

## 2020-10-25 DIAGNOSIS — J441 Chronic obstructive pulmonary disease with (acute) exacerbation: Secondary | ICD-10-CM | POA: Insufficient documentation

## 2020-10-25 DIAGNOSIS — R410 Disorientation, unspecified: Secondary | ICD-10-CM | POA: Diagnosis not present

## 2020-10-25 DIAGNOSIS — Z7901 Long term (current) use of anticoagulants: Secondary | ICD-10-CM | POA: Insufficient documentation

## 2020-10-25 DIAGNOSIS — Z87891 Personal history of nicotine dependence: Secondary | ICD-10-CM | POA: Insufficient documentation

## 2020-10-25 DIAGNOSIS — R0689 Other abnormalities of breathing: Secondary | ICD-10-CM | POA: Diagnosis not present

## 2020-10-25 DIAGNOSIS — Z79899 Other long term (current) drug therapy: Secondary | ICD-10-CM | POA: Diagnosis not present

## 2020-10-25 DIAGNOSIS — F0390 Unspecified dementia without behavioral disturbance: Secondary | ICD-10-CM | POA: Insufficient documentation

## 2020-10-25 DIAGNOSIS — I509 Heart failure, unspecified: Secondary | ICD-10-CM | POA: Diagnosis not present

## 2020-10-25 DIAGNOSIS — I110 Hypertensive heart disease with heart failure: Secondary | ICD-10-CM | POA: Diagnosis not present

## 2020-10-25 DIAGNOSIS — E039 Hypothyroidism, unspecified: Secondary | ICD-10-CM | POA: Diagnosis not present

## 2020-10-25 DIAGNOSIS — E119 Type 2 diabetes mellitus without complications: Secondary | ICD-10-CM | POA: Insufficient documentation

## 2020-10-25 DIAGNOSIS — I517 Cardiomegaly: Secondary | ICD-10-CM | POA: Diagnosis not present

## 2020-10-25 DIAGNOSIS — J9811 Atelectasis: Secondary | ICD-10-CM | POA: Diagnosis not present

## 2020-10-25 DIAGNOSIS — Z794 Long term (current) use of insulin: Secondary | ICD-10-CM | POA: Diagnosis not present

## 2020-10-25 DIAGNOSIS — E1165 Type 2 diabetes mellitus with hyperglycemia: Secondary | ICD-10-CM | POA: Diagnosis not present

## 2020-10-25 DIAGNOSIS — J811 Chronic pulmonary edema: Secondary | ICD-10-CM | POA: Diagnosis not present

## 2020-10-25 DIAGNOSIS — Z20822 Contact with and (suspected) exposure to covid-19: Secondary | ICD-10-CM | POA: Insufficient documentation

## 2020-10-25 DIAGNOSIS — I4891 Unspecified atrial fibrillation: Secondary | ICD-10-CM | POA: Diagnosis not present

## 2020-10-25 DIAGNOSIS — R000 Tachycardia, unspecified: Secondary | ICD-10-CM | POA: Diagnosis not present

## 2020-10-25 DIAGNOSIS — R4182 Altered mental status, unspecified: Secondary | ICD-10-CM | POA: Diagnosis not present

## 2020-10-25 DIAGNOSIS — I480 Paroxysmal atrial fibrillation: Secondary | ICD-10-CM | POA: Insufficient documentation

## 2020-10-25 DIAGNOSIS — J90 Pleural effusion, not elsewhere classified: Secondary | ICD-10-CM | POA: Diagnosis not present

## 2020-10-25 LAB — CBC WITH DIFFERENTIAL/PLATELET
Abs Immature Granulocytes: 0.03 10*3/uL (ref 0.00–0.07)
Basophils Absolute: 0.1 10*3/uL (ref 0.0–0.1)
Basophils Relative: 1 %
Eosinophils Absolute: 0.1 10*3/uL (ref 0.0–0.5)
Eosinophils Relative: 2 %
HCT: 35 % — ABNORMAL LOW (ref 36.0–46.0)
Hemoglobin: 10.4 g/dL — ABNORMAL LOW (ref 12.0–15.0)
Immature Granulocytes: 0 %
Lymphocytes Relative: 26 %
Lymphs Abs: 1.8 10*3/uL (ref 0.7–4.0)
MCH: 31.5 pg (ref 26.0–34.0)
MCHC: 29.7 g/dL — ABNORMAL LOW (ref 30.0–36.0)
MCV: 106.1 fL — ABNORMAL HIGH (ref 80.0–100.0)
Monocytes Absolute: 0.8 10*3/uL (ref 0.1–1.0)
Monocytes Relative: 11 %
Neutro Abs: 4.3 10*3/uL (ref 1.7–7.7)
Neutrophils Relative %: 60 %
Platelets: 234 10*3/uL (ref 150–400)
RBC: 3.3 MIL/uL — ABNORMAL LOW (ref 3.87–5.11)
RDW: 15.5 % (ref 11.5–15.5)
WBC: 7.2 10*3/uL (ref 4.0–10.5)
nRBC: 0 % (ref 0.0–0.2)

## 2020-10-25 LAB — URINALYSIS, ROUTINE W REFLEX MICROSCOPIC
Bacteria, UA: NONE SEEN
Bilirubin Urine: NEGATIVE
Glucose, UA: 50 mg/dL — AB
Hgb urine dipstick: NEGATIVE
Ketones, ur: NEGATIVE mg/dL
Nitrite: NEGATIVE
Protein, ur: 30 mg/dL — AB
Specific Gravity, Urine: 1.023 (ref 1.005–1.030)
pH: 5 (ref 5.0–8.0)

## 2020-10-25 LAB — COMPREHENSIVE METABOLIC PANEL
ALT: 12 U/L (ref 0–44)
AST: 14 U/L — ABNORMAL LOW (ref 15–41)
Albumin: 3.1 g/dL — ABNORMAL LOW (ref 3.5–5.0)
Alkaline Phosphatase: 55 U/L (ref 38–126)
Anion gap: 5 (ref 5–15)
BUN: 11 mg/dL (ref 8–23)
CO2: 30 mmol/L (ref 22–32)
Calcium: 8.9 mg/dL (ref 8.9–10.3)
Chloride: 105 mmol/L (ref 98–111)
Creatinine, Ser: 0.79 mg/dL (ref 0.44–1.00)
GFR, Estimated: >60 mL/min (ref >60)
Glucose, Bld: 194 mg/dL — ABNORMAL HIGH (ref 70–99)
Potassium: 4.8 mmol/L (ref 3.5–5.1)
Sodium: 140 mmol/L (ref 135–145)
Total Bilirubin: 0.6 mg/dL (ref 0.3–1.2)
Total Protein: 6 g/dL — ABNORMAL LOW (ref 6.5–8.1)

## 2020-10-25 LAB — RESP PANEL BY RT-PCR (FLU A&B, COVID) ARPGX2
Influenza A by PCR: NEGATIVE
Influenza B by PCR: NEGATIVE
SARS Coronavirus 2 by RT PCR: NEGATIVE

## 2020-10-25 LAB — TROPONIN I (HIGH SENSITIVITY)
Troponin I (High Sensitivity): 104 ng/L (ref <18)
Troponin I (High Sensitivity): 96 ng/L — ABNORMAL HIGH (ref <18)

## 2020-10-25 MED ORDER — ONDANSETRON HCL 4 MG/2ML IJ SOLN
4.0000 mg | Freq: Once | INTRAMUSCULAR | Status: AC
  Administered 2020-10-25: 4 mg via INTRAVENOUS
  Filled 2020-10-25: qty 2

## 2020-10-25 MED ORDER — MORPHINE SULFATE (PF) 4 MG/ML IV SOLN
4.0000 mg | Freq: Once | INTRAVENOUS | Status: AC
  Administered 2020-10-25: 4 mg via INTRAVENOUS
  Filled 2020-10-25: qty 1

## 2020-10-25 MED ORDER — IOHEXOL 350 MG/ML SOLN
100.0000 mL | Freq: Once | INTRAVENOUS | Status: AC | PRN
  Administered 2020-10-25: 100 mL via INTRAVENOUS

## 2020-10-25 MED ORDER — SODIUM CHLORIDE 0.9 % IV SOLN: INTRAVENOUS | Status: DC

## 2020-10-25 NOTE — ED Provider Notes (Addendum)
[facility] EMERGENCY DEPARTMENT Provider Note   CSN: [id number] Arrival date & time: 10/25/20  1338     History Chief Complaint  Patient presents with  . Altered Mental Status    [name] is a 70 y.o. female.  Patient brought in by daughter.  Patient lives with daughter.  Daughter notices worsening of her mental status starting last evening and continuing into this morning.  Patient has a history of dementia no recent changes in her medication.  Has a history of atrial fibrillation is on Eliquis for that.  Daughter is concerned that she may have a urinary tract infection or she also got confused before when she had a heart attack.  And that was a non-STEMI.  Past medical history significant for atrial fibrillation record shows negative cardiac catheterization in 2002 negative stress nuclear study in 2008.  Patient has diabetes hypertension hyperlipidemia pulmonary hypertension chronic combined systolic and diastolic congestive heart failure.  EF in 2016 was 55 to 60%.  And patient had a non-STEMI heart attack in February 2022.  Patient's daughter states that there was a lot of confusion during the night.  Has continued throughout the day.  She does have dementia but states that this is worse.  Normally on 2 L of oxygen at home.        Past Medical History:  Diagnosis Date  . Abnormal pulmonary function test   . Anemia    H/H of 10/30 with a normal MCV in 12/09  . Anxiety   . Arthritis   . Barrett's esophagus    Diagnosed 1995. Last EGD 2016-NO BARRETT'S.   . Chest pain    Negative cardiac catheterization in 2002; negative stress nuclear study in 2008  . Chronic anticoagulation   . Chronic combined systolic and diastolic CHF (congestive heart failure) (HCC)    a. EF predominantly normal during prior echoes but was 40% during 10/2014 echo. b. Most recent 01/2015 EF normal, 55-60%.  . Chronic LBP    Surgical intervention in 1996  . Diabetes mellitus, type 2 (HCC)     Insulin therapy; exacerbated by prednisone  . Dysrhythmia    AFib  . Gastroparesis    99% retention 05/2008 on GES  . GERD (gastroesophageal reflux disease)   . Heart attack (HCC) 08/13/2020  . Hiatal hernia   . Hyperlipidemia   . Hypertension   . Hypothyroid   . IBS (irritable bowel syndrome)   . Obesity   . OSA on CPAP    had CPAP and cannot tolerate.  . Paroxysmal atrial fibrillation (HCC)   . Pulmonary hypertension (HCC) 01/2015   a. Predominantly pulmonary venous hypertension but may be component of PAH.  . Seizures (HCC)    last seizure was 2 years ago; on keppra for this; unknown etiology  . Syncope    a. Admitted 05/2009; magnetic resonance imagin/ MRA - negative; etiology thought to be orthostasis secondary to drugs and dehydration. b. Syncope 02/2015 also felt 2/2 dehydration.    Patient Active Problem List   Diagnosis Date Noted  . Acute metabolic encephalopathy 08/30/2020  . Acute ischemic stroke (HCC)   . Respiratory failure with hypoxia (HCC) 08/24/2020  . Lobar pneumonia (HCC) 08/24/2020  . Septic shock (HCC) 08/24/2020  . NSTEMI (non-ST elevated myocardial infarction) (HCC) 08/17/2020  . Constipation 03/17/2020  . Knee pain 10/07/2019  . Fibromyalgia 10/07/2019  . Polyarthropathy 10/07/2019  . Fibromyositis 09/30/2019  . Abnormal gait due to muscle weakness 09/30/2019  . Pain  in right knee 09/30/2019  . Depression 08/31/2018  . Anxiety 08/31/2018  . Chronic respiratory failure with hypoxia and hypercapnia (HCC) 12/13/2017  . Acute systolic HF (heart failure) (HCC) 11/26/2017  . Palliative care by specialist   . Goals of care, counseling/discussion   . Acute on chronic diastolic CHF (congestive heart failure) (HCC) 08/17/2017  . Acute respiratory failure with hypoxia and hypercapnia (HCC) 08/16/2017  . Uncontrolled type 2 diabetes mellitus with hyperglycemia (HCC) 08/16/2017  . Acute respiratory failure (HCC)   . CAP (community acquired pneumonia)  08/05/2017  . COPD exacerbation (HCC) 08/05/2017  . Pulmonary hypertension (HCC) 04/05/2017  . Derangement of posterior horn of medial meniscus of right knee   . Lateral meniscus, anterior horn derangement, right   . Syncope, near 12/26/2015  . Fall at home 12/26/2015  . Dysphagia 12/07/2015  . IDA (iron deficiency anemia) 03/13/2015  . Chronic diastolic CHF (congestive heart failure) (HCC) 02/20/2015  . Diffuse abdominal pain 09/16/2014  . Ascites 08/27/2014  . Anemia 07/20/2014  . Epistaxis, recurrent 07/20/2014  . Seizure (HCC) 04/11/2014  . Dementia (HCC) 04/11/2014  . Chronic diarrhea 04/11/2014  . DM type 2 (diabetes mellitus, type 2) (HCC) 04/11/2014  . Protein-calorie malnutrition, severe (HCC) 04/11/2014  . Abnormal weight loss 08/26/2013  . Lower abdominal pain 08/26/2013  . Anorexia nervosa 08/26/2013  . Encounter for therapeutic drug monitoring 08/26/2013  . DOE (dyspnea on exertion) 06/29/2013  . Tremor 10/27/2012  . Chronic anticoagulation 06/23/2011  . HYPOTENSION, ORTHOSTATIC 08/26/2009  . Hyperlipidemia 03/04/2009  . Barrett's esophagus 09/26/2008  . Gastroparesis 09/26/2008  . Iron deficiency anemia 09/25/2008  . Essential hypertension 09/25/2008  . LUNG NODULE 09/25/2008  . Irritable bowel syndrome 09/25/2008  . Sleep apnea 09/25/2008  . CARPAL TUNNEL SYNDROME, HX OF 09/25/2008  . Hypothyroidism 04/10/2008  . Body mass index (BMI) 40.0-44.9, adult (HCC) 04/10/2008  . Atrial fibrillation (HCC) 04/10/2008  . Congestive heart failure (HCC) 04/10/2008  . Low back pain 04/10/2008  . CHEST PAIN-PRECORDIAL 04/10/2008  . Obesity 04/10/2008    Past Surgical History:  Procedure Laterality Date  . BACK SURGERY  1996  . BIOPSY N/A 11/08/2013   Procedure: BIOPSY  / Tissue sampling / ulcers present in small intestine;  Surgeon: West Bali, MD;  Location: [facility];  Service: Endoscopy;  Laterality: N/A;  . CARDIAC CATHETERIZATION  2002  . CARDIAC  CATHETERIZATION N/A 01/26/2015   Procedure: Right Heart Cath;  Surgeon: Laurey Morale, MD;  Location: [facility] INVASIVE CV LAB;  Service: Cardiovascular;  Laterality: N/A;  . CARDIOVASCULAR STRESS TEST  2008   Stress nuclear study  . CARDIOVERSION N/A 03/06/2015   Procedure: CARDIOVERSION;  Surgeon: Laurey Morale, MD;  Location: [facility] ENDOSCOPY;  Service: Cardiovascular;  Laterality: N/A;  . CARPAL TUNNEL RELEASE  1994  . COLONOSCOPY  11/2011   Dr. Darrick Penna: Internal hemorrhoids, mild diverticulosis. Random colon biopsies negative.  . COLONOSCOPY N/A 11/08/2013   SLF: Normal mucosa in the terminal ileum/The colon IS redundant/  Moderate diverticulosis throughout the entire colon. ileum bx benign. colon bx benign  . ESOPHAGOGASTRODUODENOSCOPY  2008   Barrett's without dysplasia. esphagus dilated. antral erosions, h.pylori serologies negative.  . ESOPHAGOGASTRODUODENOSCOPY  11/2011   Dr. Darrick Penna: Barrett's esophagus, mild gastritis, diverticulum in the second portion of the duodenum repeat EGD 3 years. Small bowel biopsies negative. Gastric biopsy show reactive gastropathy but no H. pylori. Esophageal biopsies consistent with GERD. Next EGD 11/2014  . ESOPHAGOGASTRODUODENOSCOPY N/A 11/21/2014   BEE:FEOF  non-erosive gastritis/irregular z-line  . GIVENS CAPSULE STUDY  12/07/2011   Proximal small bowel, rare AVM. Distal small bowel, multiple ulcers noted  . GIVENS CAPSULE STUDY N/A 09/27/2013   Distal small bowel ulcers extending to TI.  Marland Kitchen GIVENS CAPSULE STUDY N/A 10/10/2013   Procedure: GIVENS CAPSULE STUDY;  Surgeon: West Bali, MD;  Location: [facility];  Service: Endoscopy;  Laterality: N/A;  7:30  . IR KYPHO THORACIC WITH BONE BIOPSY  02/09/2018  . KNEE ARTHROSCOPY WITH MEDIAL MENISECTOMY Right 06/09/2016   Procedure: KNEE ARTHROSCOPY WITH MEDIAL MENISECTOMY;  Surgeon: Vickki Hearing, MD;  Location: [facility] ORS;  Service: Orthopedics;  Laterality: Right;  medial and lateral menisectomy  . LAMINECTOMY  1995    L4-L5  . LAPAROSCOPIC CHOLECYSTECTOMY  1990s  . LEFT HEART CATHETERIZATION WITH CORONARY ANGIOGRAM  01/10/2014   Procedure: LEFT HEART CATHETERIZATION WITH CORONARY ANGIOGRAM;  Surgeon: Lesleigh Noe, MD;  Location: [facility] CATH LAB;  Service: Cardiovascular;;  . RIGHT HEART CATHETERIZATION N/A 01/10/2014   Procedure: RIGHT HEART CATH;  Surgeon: Lesleigh Noe, MD;  Location: [facility] CATH LAB;  Service: Cardiovascular;  Laterality: N/A;  . TOTAL ABDOMINAL HYSTERECTOMY  1999  . TRACHEOSTOMY TUBE PLACEMENT N/A 09/01/2017   Procedure: TRACHEOSTOMY;  Surgeon: Drema Halon, MD;  Location: [facility];  Service: ENT;  Laterality: N/A;     OB History    Gravida  2   Para  2   Term  2   Preterm      AB      Living        SAB      IAB      Ectopic      Multiple      Live Births              Family History  Problem Relation Age of Onset  . Hypertension Mother   . Alzheimer's disease Mother   . Stroke Mother   . Heart attack Mother   . Hypertension Other   . Breast cancer Sister   . Heart disease Neg Hx   . Colon cancer Neg Hx     Social History   Tobacco Use  . Smoking status: Former Smoker    Packs/day: 0.25    Years: 15.00    Pack years: 3.75    Types: Cigarettes    Start date: 02/26/1966    Quit date: 07/01/1983    Years since quitting: 37.3  . Smokeless tobacco: Never Used  . Tobacco comment: quit in 1984  Vaping Use  . Vaping Use: Never used  Substance Use Topics  . Alcohol use: No    Alcohol/week: 0.0 standard drinks  . Drug use: No    Home Medications Prior to Admission medications   Medication Sig Start Date End Date Taking? Authorizing Provider  acetaminophen (TYLENOL) 500 MG tablet Take 500 mg by mouth as needed for moderate pain or headache.    Yes [provider]  amiodarone (PACERONE) 200 MG tablet TAKE 1 TABLET BY MOUTH EVERY MORNING 03/03/20  Yes Strader, [place] M, PA-C  apixaban (ELIQUIS) 5 MG TABS tablet Take 1 tablet (5 mg  total) by mouth 2 (two) times daily. 02/14/20  Yes BranchDorothe Pea, MD  atorvastatin (LIPITOR) 80 MG tablet Place 1 tablet (80 mg total) into feeding tube daily. 09/22/20  Yes Russella Dar, NP  Cholecalciferol (VITAMIN D3) 2000 units capsule Take 2,000 Units by mouth daily. 10/25/17  Yes  [provider]  Cyanocobalamin 5000 MCG SUBL Take 1 tablet by mouth daily.   Yes [provider]  donepezil (ARICEPT) 5 MG tablet Take 5 mg by mouth daily.   Yes [provider]  DULoxetine (CYMBALTA) 30 MG capsule Take 30 mg by mouth 2 (two) times daily. 11/24/17  Yes [provider]  feeding supplement (ENSURE ENLIVE / ENSURE PLUS) LIQD Take 237 mLs by mouth 3 (three) times daily between meals. 09/21/20  Yes Russella DarEllis, Allison L, NP  furosemide (LASIX) 20 MG tablet Take 1 tablet (20 mg total) by mouth daily. 09/22/20  Yes Russella DarEllis, Allison L, NP  gabapentin (NEURONTIN) 100 MG capsule Take 1 capsule (100 mg total) by mouth 3 (three) times daily. 09/21/20  Yes Russella DarEllis, Allison L, NP  insulin glargine (LANTUS) 100 UNIT/ML injection Inject 0.1 mLs (10 Units total) into the skin daily. 09/21/20  Yes Russella DarEllis, Allison L, NP  Lactase 9000 units CHEW 1 PO Q4H WHEN YOU EAT ICE CREAM OR CHEESE Patient taking differently: Chew 1 tablet by mouth every 4 (four) hours as needed (when eating dairy products). 09/05/19  Yes Fields, Darleene CleaverSandi L, MD  levETIRAcetam (KEPPRA) 500 MG tablet Take 2 tablets (1,000 mg total) by mouth 2 (two) times daily. 09/21/20  Yes Russella DarEllis, Allison L, NP  levothyroxine (SYNTHROID) 175 MCG tablet Take 1 tablet by mouth daily. 05/20/19  Yes [provider]  loperamide (IMODIUM) 2 MG capsule 1 PO Q4H PRN LOOSE STOOLS Patient taking differently: Take 4 mg by mouth every 4 (four) hours as needed. 1 PO Q4H PRN LOOSE STOOLS 09/05/19  Yes Fields, Sandi L, MD  LORazepam (ATIVAN) 0.5 MG tablet Take 0.5 mg by mouth every 8 (eight) hours.   Yes [provider]  meclizine (ANTIVERT) 25  MG tablet Take 1 tablet (25 mg total) by mouth every 8 (eight) hours as needed for dizziness. 10/16/20  Yes BranchDorothe Pea, Jonathan F, MD  Multiple Vitamin (MULTIVITAMIN WITH MINERALS) TABS tablet Take 1 tablet by mouth daily.   Yes [provider]  ondansetron (ZOFRAN) 4 MG tablet TAKE 1 TABLET BY MOUTH FOUR TIMES DAILY AS NEEDED FOR NAUSEA Patient taking differently: Take 4 mg by mouth 4 (four) times daily as needed for nausea. 01/10/18  Yes Gelene MinkBoone, Anna W, NP  OXYGEN 2 L daily.    Yes [provider]  pantoprazole (PROTONIX) 40 MG tablet TAKE ONE TABLET BY MOUTH TWICE DAILY BEFORE APPLY MEAL Patient taking differently: Take 40 mg by mouth 2 (two) times daily. 02/07/17  Yes Gelene MinkBoone, Anna W, NP  potassium chloride (KLOR-CON) 10 MEQ tablet Take 20 mEq by mouth daily.   Yes [provider]  SM MELATONIN 3 MG TABS tablet Take 3 mg by mouth at bedtime as needed for sleep. 08/22/20  Yes [provider]  magnesium oxide (MAG-OX) 400 MG tablet Take 1 tablet (400 mg total) by mouth 2 (two) times daily. Patient not taking: Reported on 10/25/2020 12/26/17   Antoine PocheBranch, Jonathan F, MD    Allergies    Citalopram hydrobromide, Codeine, Hydromorphone hcl, Metoclopramide, and Hyoscyamine  Review of Systems   Review of Systems  Unable to perform ROS: Dementia    Physical Exam Updated Vital Signs BP (!) 153/103   Pulse (!) 101   Temp 99 F (37.2 C) (Oral)   Resp (!) 21   Ht 1.676 m (5\' 6" )   Wt 98.4 kg   SpO2 99%   BMI 35.02 kg/m   Physical Exam Vitals and nursing  note reviewed.  Constitutional:      General: She is not in acute distress.    Appearance: Normal appearance. She is well-developed.  HENT:     Head: Normocephalic and atraumatic.     Mouth/Throat:     Mouth: Mucous membranes are dry.  Eyes:     Extraocular Movements: Extraocular movements intact.     Conjunctiva/sclera: Conjunctivae normal.     Pupils: Pupils are equal, round, and reactive to light.   Cardiovascular:     Rate and Rhythm: Normal rate. Rhythm irregular.     Heart sounds: No murmur heard.   Pulmonary:     Effort: Pulmonary effort is normal. No respiratory distress.     Breath sounds: Normal breath sounds.  Abdominal:     Palpations: Abdomen is soft.     Tenderness: There is no abdominal tenderness.  Musculoskeletal:        General: Normal range of motion.     Cervical back: Neck supple. No rigidity.     Comments: No extremity weakness.  Skin:    General: Skin is warm and dry.  Neurological:     General: No focal deficit present.     Mental Status: She is alert.     Cranial Nerves: No cranial nerve deficit.     Sensory: No sensory deficit.     Motor: No weakness.     Comments: Patient will follow commands will communicate.  But clearly there is degree of confusion.  Neuro exam without any deficits.     ED Results / Procedures / Treatments   Labs (all labs ordered are listed, but only abnormal results are displayed) Labs Reviewed  URINALYSIS, ROUTINE W REFLEX MICROSCOPIC - Abnormal; Notable for the following components:      Result Value   Color, Urine AMBER (*)    APPearance HAZY (*)    Glucose, UA 50 (*)    Protein, ur 30 (*)    Leukocytes,Ua SMALL (*)    All other components within normal limits  COMPREHENSIVE METABOLIC PANEL  CBC WITH DIFFERENTIAL/PLATELET  TROPONIN I (HIGH SENSITIVITY)  TROPONIN I (HIGH SENSITIVITY)    EKG EKG Interpretation  Date/Time:  Sunday October 25 2020 14:09:08 EDT Ventricular Rate:  94 PR Interval:    QRS Duration: 115 QT Interval:  365 QTC Calculation: 457 R Axis:   -34 Text Interpretation: Atrial fibrillation Nonspecific intraventricular conduction delay Borderline repolarization abnormality Confirmed by Vanetta Mulders [phone]) on 10/25/2020 2:27:46 PM   Radiology CT Head Wo Contrast  Result Date: 10/25/2020 CLINICAL DATA:  Altered mental status EXAM: CT HEAD WITHOUT CONTRAST TECHNIQUE: Contiguous axial  images were obtained from the base of the skull through the vertex without intravenous contrast. COMPARISON:  MRI brain September 08, 2020 and head CT September 06, 2020 FINDINGS: Brain: No evidence of acute large vascular territory infarction, hemorrhage, hydrocephalus, extra-axial collection or mass lesion/mass effect. Stable mild burden of chronic microvascular white matter ischemic disease. Stable age-related global parenchymal volume loss. Vascular: No hyperdense vessel. Atherosclerotic calcifications of the internal carotid arteries at skull base. Skull: Normal. Negative for fracture or focal lesion. Sinuses/Orbits: Paranasal sinuses are predominantly clear. Orbits are unremarkable. Other: Decreased now minimal bilateral mastoid effusions. IMPRESSION: 1. No acute intracranial findings. 2. Stable mild burden of chronic microvascular white matter ischemic disease and age-related global parenchymal volume loss. Electronically Signed   By: Maudry Mayhew MD   On: 10/25/2020 15:28   DG Chest Port 1 View  Result Date: 10/25/2020 CLINICAL DATA:  Altered mental status EXAM: PORTABLE CHEST 1 VIEW COMPARISON:  09/20/2020 FINDINGS: Stable cardiomegaly. Atherosclerotic calcification of the aortic knob. Pulmonary vascular congestion with bilateral interstitial prominence. More patchy airspace opacity within the right upper lobe and left perihilar region. No significant pleural fluid collection. No pneumothorax. IMPRESSION: Cardiomegaly with pulmonary vascular congestion and bilateral interstitial prominence. More patchy airspace opacity within the right upper lobe and left perihilar region may reflect asymmetric edema versus infiltrate. Electronically Signed   By: Duanne Guess D.O.   On: 10/25/2020 14:39    Procedures Procedures   Medications Ordered in ED Medications  0.9 %  sodium chloride infusion ( Intravenous New Bag/Given 10/25/20 1536)    ED Course  I have reviewed the triage vital signs and the nursing  notes.  Pertinent labs & imaging results that were available during my care of the patient were reviewed by me and considered in my medical decision making (see chart for details).    MDM Rules/Calculators/A&P                          EKG shows atrial fibrillation but rate controlled.  Patient is taking her Eliquis.  Chest x-ray raises concerns for right upper lobe opacity and may be a left hilar opacity.  Urinalysis hazy but otherwise negative no distinct evidence of urinary tract infection.  Patient's oxygen saturations are fine here.  Patient normally on 2 L of oxygen at home.  Labs still pending.  Troponin ordered because of the concerned that the daughter had.  Head CT without any acute findings  Patient turned over to Dr. Alba Destine in the evening emergency physician who will follow-up on the labs.   Final Clinical Impression(s) / ED Diagnoses Final diagnoses:  Altered mental status, unspecified altered mental status type    Rx / DC Orders ED Discharge Orders    None       Vanetta Mulders, MD 10/25/20 1612    Vanetta Mulders, MD 10/25/20 [phone]

## 2020-10-25 NOTE — ED Triage Notes (Addendum)
Pt to the ED with slight altered mental status and complains of burning with urination.  Pt answers orientation perfectly, but her daughter states she is having hallucinations that begin at night. The description sounds like sundowning.

## 2020-10-25 NOTE — ED Notes (Signed)
Requested [facility] to take pt home per daughters request.

## 2020-10-25 NOTE — ED Notes (Signed)
Patient transported to CT 

## 2020-10-25 NOTE — ED Notes (Signed)
Attempted to ambulate pt. Pt able to sit up on side of bed, pt stood with walker  And took a couple steps. Gait unsteady. Pt daughter asked we stop she didn't feel comfortable with her trying to walk much moore. MD made aware

## 2020-10-25 NOTE — ED Provider Notes (Signed)
Pt signed out by Dr. Deretha Emory pending lab results.    Pt's troponin is elevated to 106, but that is down from 260 in Feb.   CT head:  IMPRESSION:  1. No acute intracranial findings.  2. Stable mild burden of chronic microvascular white matter ischemic  disease and age-related global parenchymal volume loss.     CTA chest:    IMPRESSION:  1. Negative for pulmonary embolism.  2. Trace bilateral pleural effusions.  3. Mosaic attenuation within the right upper lobe, which can be seen  in the setting of pulmonary edema and small airways or small vessel  disease.  4. Band-like opacities in the right upper lobe and bibasilar regions  suggesting atelectasis.  5. Findings of chronic cardiomegaly and pulmonary arterial  hypertension.  6. Aortic atherosclerosis (ICD10-I70.0).     Pt is oxygenating well on her normal 2L.  She is only having AMS at night.  She is likely sundowning from her dementia.  Pt did have some low back pain which is chronic.  After pain meds, her hr came down.  Pt feels much better.    Pt was able to take a few steps with a walker, but she was wobbly.  Pt had recently received morphine, so pt was put back in bed.  Pt is able to be safely d/c home.  Daughter will call pcp and her neurologist for f/u.  Return if worse.    Jacalyn Lefevre, MD 10/25/20 2047

## 2020-10-25 NOTE — ED Notes (Signed)
Date and time results received: 10/25/20 4:58 PM  Test: Troponin Critical Value: 106  Name of Provider Notified: Dr. Particia Nearing  Orders Received? Or Actions Taken?: see orders

## 2020-11-02 DIAGNOSIS — I083 Combined rheumatic disorders of mitral, aortic and tricuspid valves: Secondary | ICD-10-CM | POA: Diagnosis not present

## 2020-11-02 DIAGNOSIS — I5042 Chronic combined systolic (congestive) and diastolic (congestive) heart failure: Secondary | ICD-10-CM | POA: Diagnosis not present

## 2020-11-02 DIAGNOSIS — K3184 Gastroparesis: Secondary | ICD-10-CM | POA: Diagnosis not present

## 2020-11-02 DIAGNOSIS — M4802 Spinal stenosis, cervical region: Secondary | ICD-10-CM | POA: Diagnosis not present

## 2020-11-02 DIAGNOSIS — M159 Polyosteoarthritis, unspecified: Secondary | ICD-10-CM | POA: Diagnosis not present

## 2020-11-02 DIAGNOSIS — J9601 Acute respiratory failure with hypoxia: Secondary | ICD-10-CM | POA: Diagnosis not present

## 2020-11-02 DIAGNOSIS — M2578 Osteophyte, vertebrae: Secondary | ICD-10-CM | POA: Diagnosis not present

## 2020-11-02 DIAGNOSIS — E039 Hypothyroidism, unspecified: Secondary | ICD-10-CM | POA: Diagnosis not present

## 2020-11-02 DIAGNOSIS — M47812 Spondylosis without myelopathy or radiculopathy, cervical region: Secondary | ICD-10-CM | POA: Diagnosis not present

## 2020-11-02 DIAGNOSIS — M47814 Spondylosis without myelopathy or radiculopathy, thoracic region: Secondary | ICD-10-CM | POA: Diagnosis not present

## 2020-11-02 DIAGNOSIS — M797 Fibromyalgia: Secondary | ICD-10-CM | POA: Diagnosis not present

## 2020-11-02 DIAGNOSIS — G319 Degenerative disease of nervous system, unspecified: Secondary | ICD-10-CM | POA: Diagnosis not present

## 2020-11-02 DIAGNOSIS — I272 Pulmonary hypertension, unspecified: Secondary | ICD-10-CM | POA: Diagnosis not present

## 2020-11-02 DIAGNOSIS — D631 Anemia in chronic kidney disease: Secondary | ICD-10-CM | POA: Diagnosis not present

## 2020-11-02 DIAGNOSIS — N1831 Chronic kidney disease, stage 3a: Secondary | ICD-10-CM | POA: Diagnosis not present

## 2020-11-02 DIAGNOSIS — E1143 Type 2 diabetes mellitus with diabetic autonomic (poly)neuropathy: Secondary | ICD-10-CM | POA: Diagnosis not present

## 2020-11-02 DIAGNOSIS — E1122 Type 2 diabetes mellitus with diabetic chronic kidney disease: Secondary | ICD-10-CM | POA: Diagnosis not present

## 2020-11-02 DIAGNOSIS — G40909 Epilepsy, unspecified, not intractable, without status epilepticus: Secondary | ICD-10-CM | POA: Diagnosis not present

## 2020-11-02 DIAGNOSIS — K2270 Barrett's esophagus without dysplasia: Secondary | ICD-10-CM | POA: Diagnosis not present

## 2020-11-02 DIAGNOSIS — M47816 Spondylosis without myelopathy or radiculopathy, lumbar region: Secondary | ICD-10-CM | POA: Diagnosis not present

## 2020-11-02 DIAGNOSIS — I480 Paroxysmal atrial fibrillation: Secondary | ICD-10-CM | POA: Diagnosis not present

## 2020-11-02 DIAGNOSIS — I5032 Chronic diastolic (congestive) heart failure: Secondary | ICD-10-CM | POA: Diagnosis not present

## 2020-11-02 DIAGNOSIS — F0390 Unspecified dementia without behavioral disturbance: Secondary | ICD-10-CM | POA: Diagnosis not present

## 2020-11-02 DIAGNOSIS — R269 Unspecified abnormalities of gait and mobility: Secondary | ICD-10-CM | POA: Diagnosis not present

## 2020-11-02 DIAGNOSIS — J9811 Atelectasis: Secondary | ICD-10-CM | POA: Diagnosis not present

## 2020-11-02 DIAGNOSIS — H539 Unspecified visual disturbance: Secondary | ICD-10-CM | POA: Diagnosis not present

## 2020-11-02 DIAGNOSIS — I130 Hypertensive heart and chronic kidney disease with heart failure and stage 1 through stage 4 chronic kidney disease, or unspecified chronic kidney disease: Secondary | ICD-10-CM | POA: Diagnosis not present

## 2020-11-04 DIAGNOSIS — I482 Chronic atrial fibrillation, unspecified: Secondary | ICD-10-CM | POA: Diagnosis not present

## 2020-11-04 DIAGNOSIS — J181 Lobar pneumonia, unspecified organism: Secondary | ICD-10-CM | POA: Diagnosis not present

## 2020-11-04 DIAGNOSIS — J9620 Acute and chronic respiratory failure, unspecified whether with hypoxia or hypercapnia: Secondary | ICD-10-CM | POA: Diagnosis not present

## 2020-11-04 DIAGNOSIS — I5020 Unspecified systolic (congestive) heart failure: Secondary | ICD-10-CM | POA: Diagnosis not present

## 2020-11-16 DIAGNOSIS — J9811 Atelectasis: Secondary | ICD-10-CM | POA: Diagnosis not present

## 2020-11-16 DIAGNOSIS — E1122 Type 2 diabetes mellitus with diabetic chronic kidney disease: Secondary | ICD-10-CM | POA: Diagnosis not present

## 2020-11-16 DIAGNOSIS — M4802 Spinal stenosis, cervical region: Secondary | ICD-10-CM | POA: Diagnosis not present

## 2020-11-16 DIAGNOSIS — K3184 Gastroparesis: Secondary | ICD-10-CM | POA: Diagnosis not present

## 2020-11-16 DIAGNOSIS — M47816 Spondylosis without myelopathy or radiculopathy, lumbar region: Secondary | ICD-10-CM | POA: Diagnosis not present

## 2020-11-16 DIAGNOSIS — M797 Fibromyalgia: Secondary | ICD-10-CM | POA: Diagnosis not present

## 2020-11-16 DIAGNOSIS — I480 Paroxysmal atrial fibrillation: Secondary | ICD-10-CM | POA: Diagnosis not present

## 2020-11-16 DIAGNOSIS — M159 Polyosteoarthritis, unspecified: Secondary | ICD-10-CM | POA: Diagnosis not present

## 2020-11-16 DIAGNOSIS — G319 Degenerative disease of nervous system, unspecified: Secondary | ICD-10-CM | POA: Diagnosis not present

## 2020-11-16 DIAGNOSIS — J9601 Acute respiratory failure with hypoxia: Secondary | ICD-10-CM | POA: Diagnosis not present

## 2020-11-16 DIAGNOSIS — E039 Hypothyroidism, unspecified: Secondary | ICD-10-CM | POA: Diagnosis not present

## 2020-11-16 DIAGNOSIS — M47814 Spondylosis without myelopathy or radiculopathy, thoracic region: Secondary | ICD-10-CM | POA: Diagnosis not present

## 2020-11-16 DIAGNOSIS — I272 Pulmonary hypertension, unspecified: Secondary | ICD-10-CM | POA: Diagnosis not present

## 2020-11-16 DIAGNOSIS — F0390 Unspecified dementia without behavioral disturbance: Secondary | ICD-10-CM | POA: Diagnosis not present

## 2020-11-16 DIAGNOSIS — H539 Unspecified visual disturbance: Secondary | ICD-10-CM | POA: Diagnosis not present

## 2020-11-16 DIAGNOSIS — G40909 Epilepsy, unspecified, not intractable, without status epilepticus: Secondary | ICD-10-CM | POA: Diagnosis not present

## 2020-11-16 DIAGNOSIS — I083 Combined rheumatic disorders of mitral, aortic and tricuspid valves: Secondary | ICD-10-CM | POA: Diagnosis not present

## 2020-11-16 DIAGNOSIS — E1143 Type 2 diabetes mellitus with diabetic autonomic (poly)neuropathy: Secondary | ICD-10-CM | POA: Diagnosis not present

## 2020-11-16 DIAGNOSIS — K2270 Barrett's esophagus without dysplasia: Secondary | ICD-10-CM | POA: Diagnosis not present

## 2020-11-16 DIAGNOSIS — M47812 Spondylosis without myelopathy or radiculopathy, cervical region: Secondary | ICD-10-CM | POA: Diagnosis not present

## 2020-11-16 DIAGNOSIS — I5042 Chronic combined systolic (congestive) and diastolic (congestive) heart failure: Secondary | ICD-10-CM | POA: Diagnosis not present

## 2020-11-16 DIAGNOSIS — N1831 Chronic kidney disease, stage 3a: Secondary | ICD-10-CM | POA: Diagnosis not present

## 2020-11-16 DIAGNOSIS — I130 Hypertensive heart and chronic kidney disease with heart failure and stage 1 through stage 4 chronic kidney disease, or unspecified chronic kidney disease: Secondary | ICD-10-CM | POA: Diagnosis not present

## 2020-11-16 DIAGNOSIS — M2578 Osteophyte, vertebrae: Secondary | ICD-10-CM | POA: Diagnosis not present

## 2020-11-16 DIAGNOSIS — D631 Anemia in chronic kidney disease: Secondary | ICD-10-CM | POA: Diagnosis not present

## 2020-11-16 IMAGING — DX DG LUMBAR SPINE COMPLETE 4+V
5 series · 5 of 5 positions shown · non-contrast
Comparison: December 22, 2017

CLINICAL DATA: Chronic lumbago

EXAM:
LUMBAR SPINE - COMPLETE 4+ VIEW

[l-spine ap]
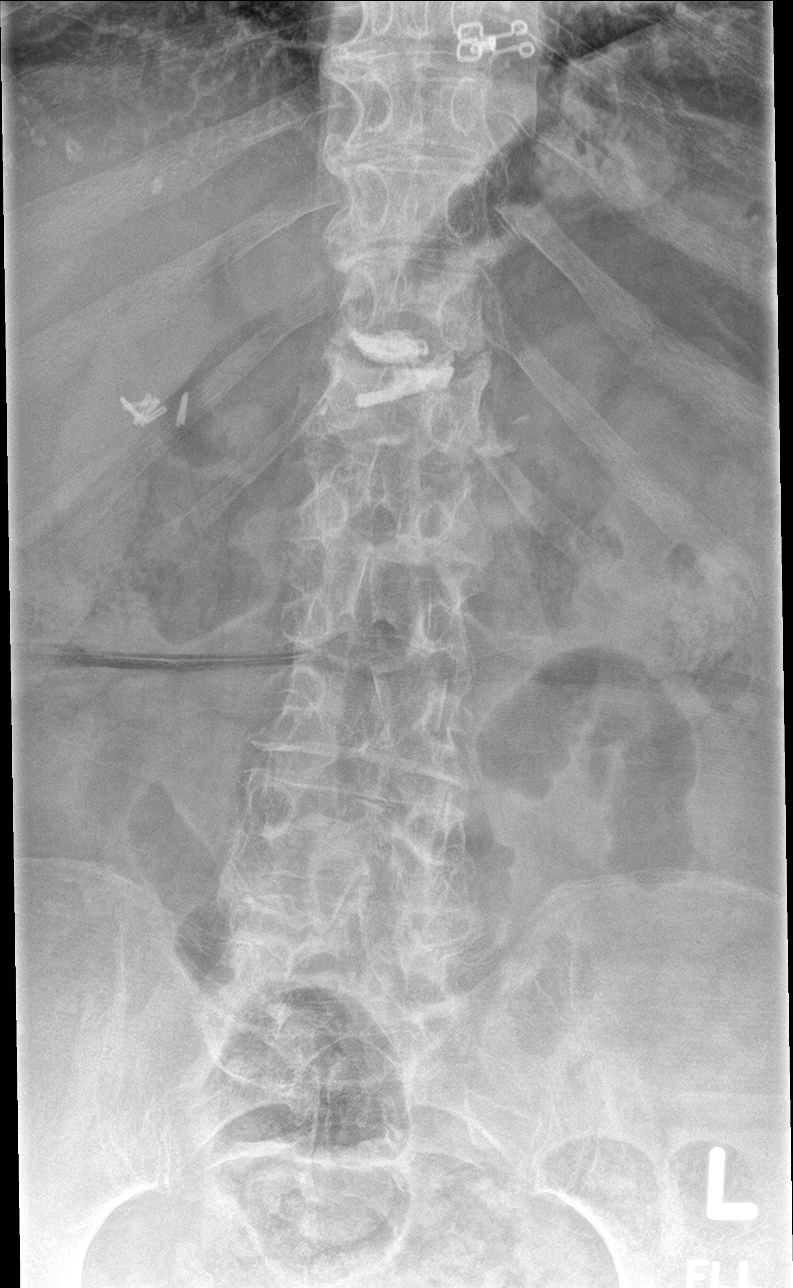

[l-spine obl (1 of 2)]
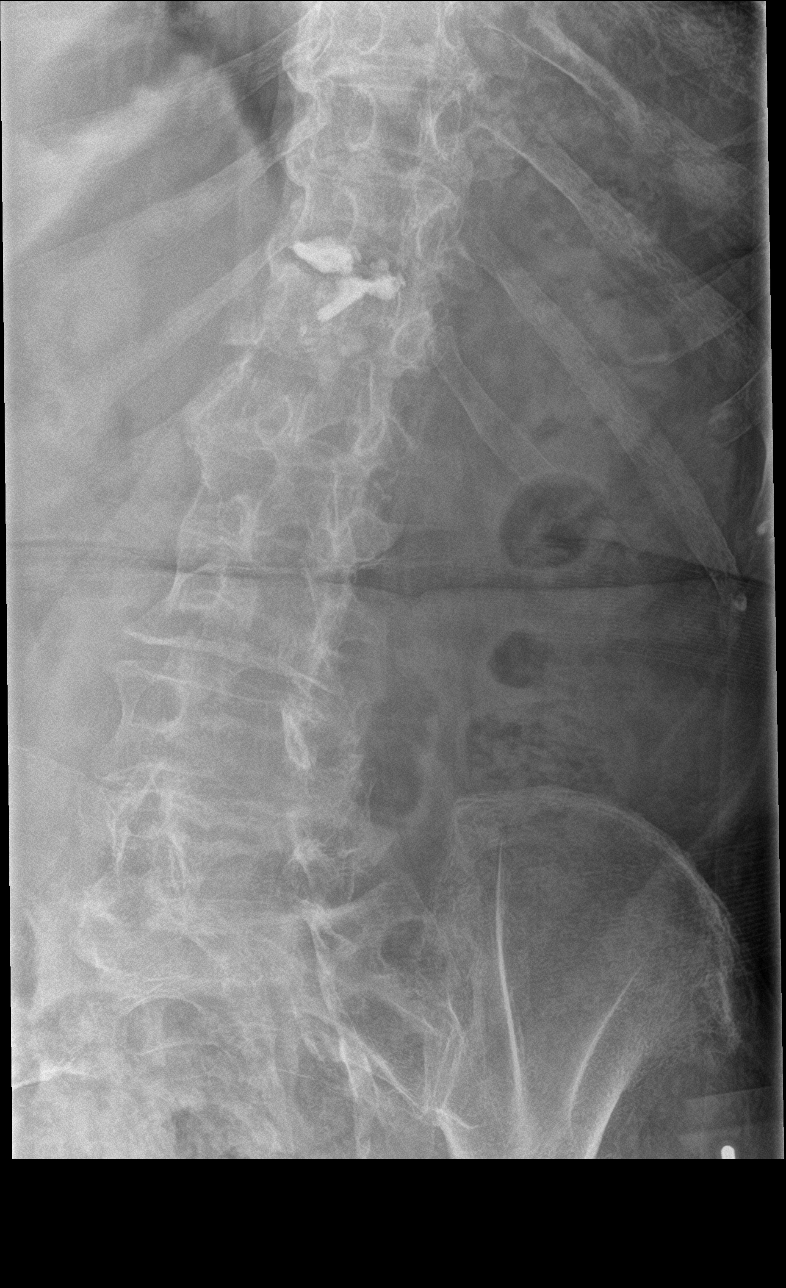

[l-spine obl (2 of 2)]
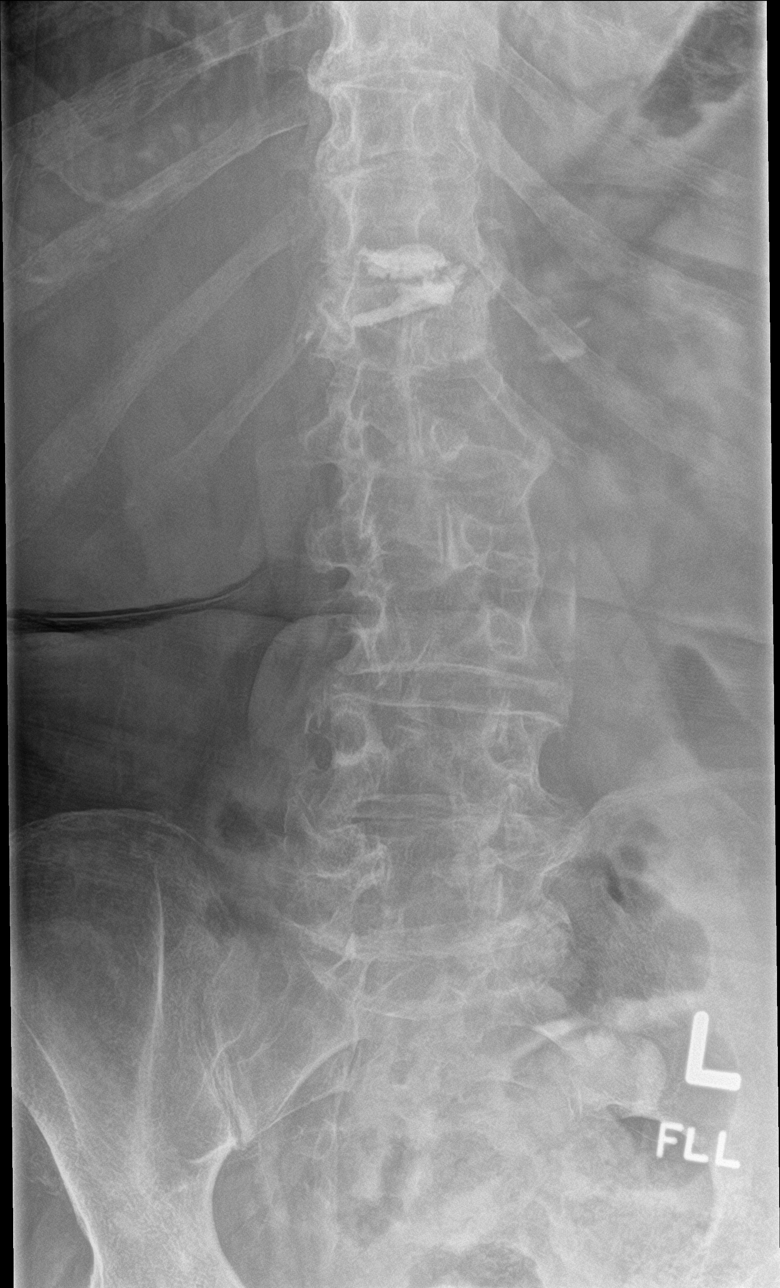

[l-spine lat]
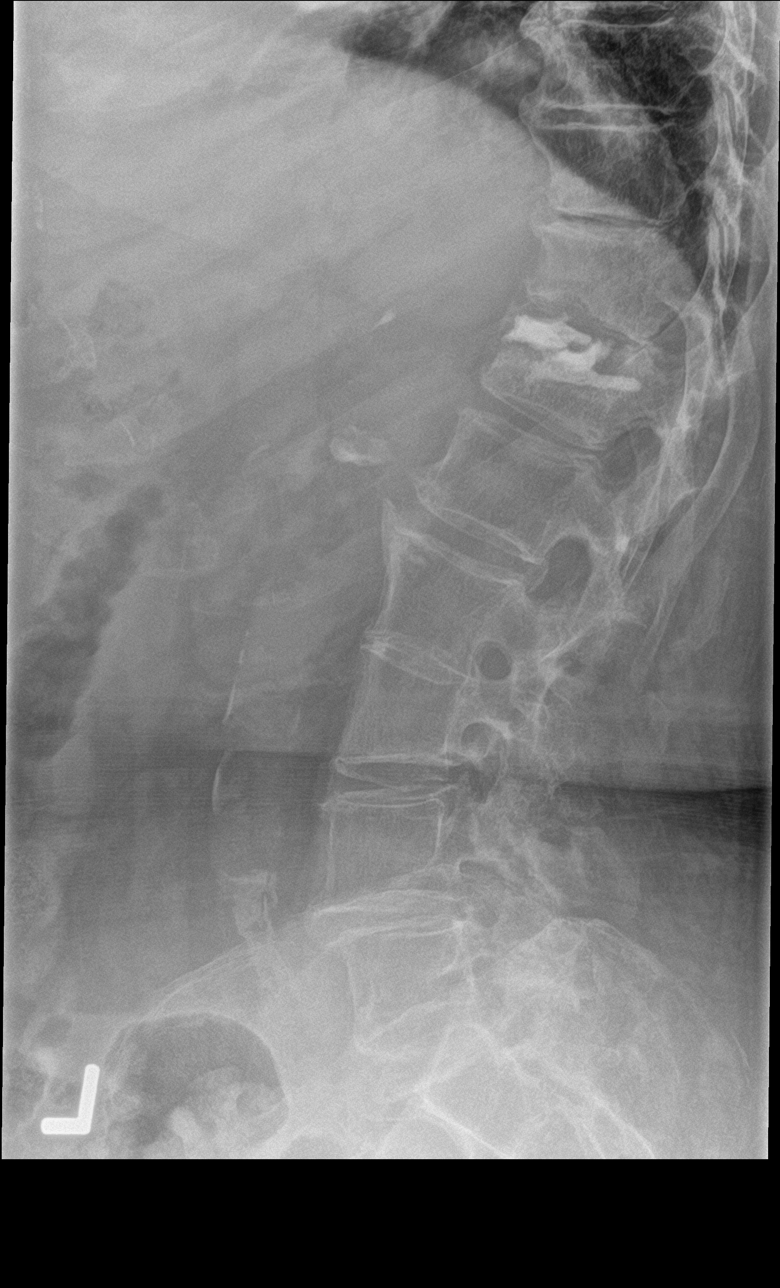

[l-spine spot]
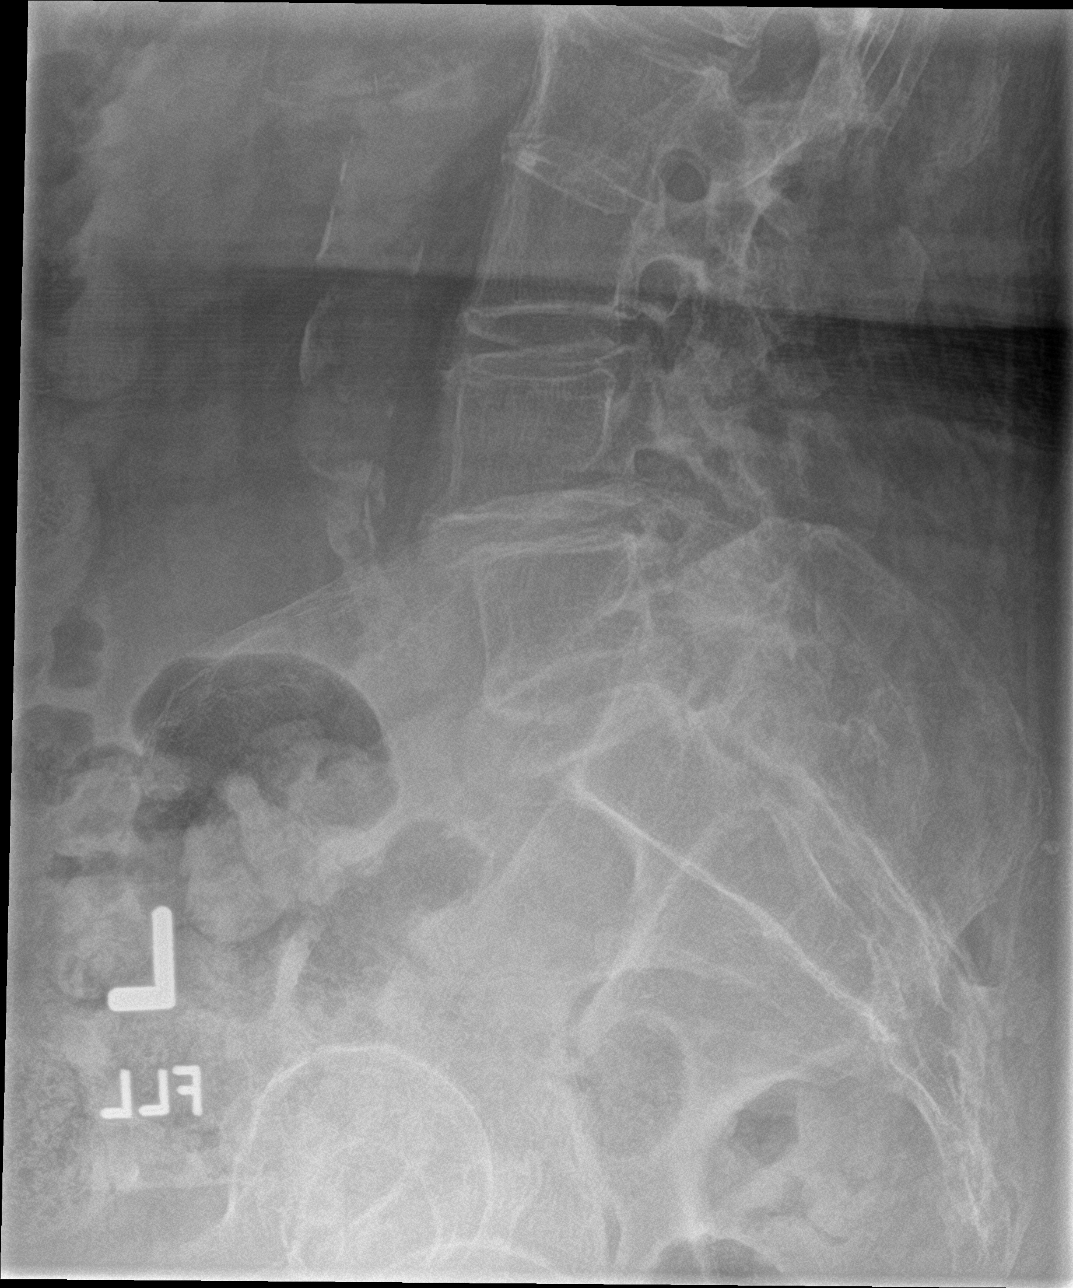

[5 of 5 positions shown; findings below may reference images not displayed]

FINDINGS: Frontal, lateral, spot lumbosacral lateral, and bilateral oblique
views were obtained. There are 5 non-rib-bearing lumbar type
vertebral bodies. The patient is status post previous kyphoplasty at
T12. there is no lumbar fracture. No spondylolisthesis. There is
disc space narrowing at T10-11 and T11-12 as well as at L2-3, L3-4,
L4-5, and L5-S1 with disc space narrowing most marked at T11-12 and
L2-3. There is facet osteoarthritic change at L3-4, L4-5, and L5-S1
bilaterally. There is aortic and iliac artery atherosclerosis.
IMPRESSION: Multilevel arthropathy. Status post kyphoplasty at T12. No acute
fracture evident. No spondylolisthesis.

There is aortoiliac atherosclerosis.

Aortic Atherosclerosis ([id number]-X78.8).

## 2020-11-21 ENCOUNTER — Emergency Department (HOSPITAL_COMMUNITY): Payer: PPO

## 2020-11-21 ENCOUNTER — Telehealth: Payer: PPO | Admitting: Physician Assistant

## 2020-11-21 ENCOUNTER — Inpatient Hospital Stay (HOSPITAL_COMMUNITY)
Admission: EM | Admit: 2020-11-21 | Discharge: 2020-11-24 | DRG: 291 | Disposition: A | Discharge status: Home / Self Care | Payer: PPO | Attending: Internal Medicine | Admitting: Internal Medicine

## 2020-11-21 ENCOUNTER — Ambulatory Visit: Payer: Self-pay

## 2020-11-21 ENCOUNTER — Encounter: Payer: Self-pay | Admitting: Physician Assistant

## 2020-11-21 ENCOUNTER — Encounter (HOSPITAL_COMMUNITY): Payer: Self-pay | Admitting: Internal Medicine

## 2020-11-21 ENCOUNTER — Other Ambulatory Visit: Payer: Self-pay

## 2020-11-21 DIAGNOSIS — E441 Mild protein-calorie malnutrition: Secondary | ICD-10-CM | POA: Diagnosis not present

## 2020-11-21 DIAGNOSIS — I130 Hypertensive heart and chronic kidney disease with heart failure and stage 1 through stage 4 chronic kidney disease, or unspecified chronic kidney disease: Principal | ICD-10-CM | POA: Diagnosis present

## 2020-11-21 DIAGNOSIS — R4781 Slurred speech: Secondary | ICD-10-CM | POA: Diagnosis not present

## 2020-11-21 DIAGNOSIS — M549 Dorsalgia, unspecified: Secondary | ICD-10-CM | POA: Diagnosis present

## 2020-11-21 DIAGNOSIS — F0390 Unspecified dementia without behavioral disturbance: Secondary | ICD-10-CM

## 2020-11-21 DIAGNOSIS — E1143 Type 2 diabetes mellitus with diabetic autonomic (poly)neuropathy: Secondary | ICD-10-CM | POA: Diagnosis not present

## 2020-11-21 DIAGNOSIS — I2510 Atherosclerotic heart disease of native coronary artery without angina pectoris: Secondary | ICD-10-CM | POA: Diagnosis not present

## 2020-11-21 DIAGNOSIS — R3989 Other symptoms and signs involving the genitourinary system: Secondary | ICD-10-CM

## 2020-11-21 DIAGNOSIS — I517 Cardiomegaly: Secondary | ICD-10-CM | POA: Diagnosis not present

## 2020-11-21 DIAGNOSIS — G9341 Metabolic encephalopathy: Secondary | ICD-10-CM | POA: Diagnosis present

## 2020-11-21 DIAGNOSIS — Z794 Long term (current) use of insulin: Secondary | ICD-10-CM

## 2020-11-21 DIAGNOSIS — J811 Chronic pulmonary edema: Secondary | ICD-10-CM | POA: Diagnosis not present

## 2020-11-21 DIAGNOSIS — E662 Morbid (severe) obesity with alveolar hypoventilation: Secondary | ICD-10-CM | POA: Diagnosis not present

## 2020-11-21 DIAGNOSIS — G40909 Epilepsy, unspecified, not intractable, without status epilepticus: Secondary | ICD-10-CM | POA: Diagnosis not present

## 2020-11-21 DIAGNOSIS — Z7901 Long term (current) use of anticoagulants: Secondary | ICD-10-CM

## 2020-11-21 DIAGNOSIS — Z79899 Other long term (current) drug therapy: Secondary | ICD-10-CM

## 2020-11-21 DIAGNOSIS — R569 Unspecified convulsions: Secondary | ICD-10-CM

## 2020-11-21 DIAGNOSIS — G8929 Other chronic pain: Secondary | ICD-10-CM | POA: Diagnosis present

## 2020-11-21 DIAGNOSIS — I5021 Acute systolic (congestive) heart failure: Secondary | ICD-10-CM | POA: Diagnosis not present

## 2020-11-21 DIAGNOSIS — I10 Essential (primary) hypertension: Secondary | ICD-10-CM | POA: Diagnosis not present

## 2020-11-21 DIAGNOSIS — N1831 Chronic kidney disease, stage 3a: Secondary | ICD-10-CM | POA: Diagnosis present

## 2020-11-21 DIAGNOSIS — D631 Anemia in chronic kidney disease: Secondary | ICD-10-CM | POA: Diagnosis present

## 2020-11-21 DIAGNOSIS — I482 Chronic atrial fibrillation, unspecified: Secondary | ICD-10-CM | POA: Diagnosis present

## 2020-11-21 DIAGNOSIS — I5033 Acute on chronic diastolic (congestive) heart failure: Secondary | ICD-10-CM | POA: Diagnosis not present

## 2020-11-21 DIAGNOSIS — H538 Other visual disturbances: Secondary | ICD-10-CM | POA: Diagnosis not present

## 2020-11-21 DIAGNOSIS — I509 Heart failure, unspecified: Secondary | ICD-10-CM | POA: Diagnosis not present

## 2020-11-21 DIAGNOSIS — Z6836 Body mass index (BMI) 36.0-36.9, adult: Secondary | ICD-10-CM | POA: Diagnosis not present

## 2020-11-21 DIAGNOSIS — I252 Old myocardial infarction: Secondary | ICD-10-CM | POA: Diagnosis not present

## 2020-11-21 DIAGNOSIS — L8990 Pressure ulcer of unspecified site, unspecified stage: Secondary | ICD-10-CM

## 2020-11-21 DIAGNOSIS — L89151 Pressure ulcer of sacral region, stage 1: Secondary | ICD-10-CM | POA: Diagnosis not present

## 2020-11-21 DIAGNOSIS — R443 Hallucinations, unspecified: Secondary | ICD-10-CM | POA: Diagnosis not present

## 2020-11-21 DIAGNOSIS — J9811 Atelectasis: Secondary | ICD-10-CM | POA: Diagnosis present

## 2020-11-21 DIAGNOSIS — K3184 Gastroparesis: Secondary | ICD-10-CM | POA: Diagnosis present

## 2020-11-21 DIAGNOSIS — J9621 Acute and chronic respiratory failure with hypoxia: Secondary | ICD-10-CM | POA: Diagnosis present

## 2020-11-21 DIAGNOSIS — E782 Mixed hyperlipidemia: Secondary | ICD-10-CM

## 2020-11-21 DIAGNOSIS — E785 Hyperlipidemia, unspecified: Secondary | ICD-10-CM | POA: Diagnosis not present

## 2020-11-21 DIAGNOSIS — R918 Other nonspecific abnormal finding of lung field: Secondary | ICD-10-CM | POA: Diagnosis not present

## 2020-11-21 DIAGNOSIS — Z20822 Contact with and (suspected) exposure to covid-19: Secondary | ICD-10-CM | POA: Diagnosis present

## 2020-11-21 DIAGNOSIS — R42 Dizziness and giddiness: Secondary | ICD-10-CM | POA: Diagnosis not present

## 2020-11-21 DIAGNOSIS — Z79890 Hormone replacement therapy (postmenopausal): Secondary | ICD-10-CM

## 2020-11-21 DIAGNOSIS — E8809 Other disorders of plasma-protein metabolism, not elsewhere classified: Secondary | ICD-10-CM | POA: Diagnosis not present

## 2020-11-21 DIAGNOSIS — Z8673 Personal history of transient ischemic attack (TIA), and cerebral infarction without residual deficits: Secondary | ICD-10-CM

## 2020-11-21 DIAGNOSIS — R4182 Altered mental status, unspecified: Secondary | ICD-10-CM | POA: Diagnosis present

## 2020-11-21 DIAGNOSIS — Z87891 Personal history of nicotine dependence: Secondary | ICD-10-CM

## 2020-11-21 DIAGNOSIS — E1165 Type 2 diabetes mellitus with hyperglycemia: Secondary | ICD-10-CM | POA: Diagnosis present

## 2020-11-21 DIAGNOSIS — K219 Gastro-esophageal reflux disease without esophagitis: Secondary | ICD-10-CM | POA: Diagnosis not present

## 2020-11-21 DIAGNOSIS — R0902 Hypoxemia: Secondary | ICD-10-CM | POA: Diagnosis not present

## 2020-11-21 DIAGNOSIS — Z888 Allergy status to other drugs, medicaments and biological substances status: Secondary | ICD-10-CM

## 2020-11-21 DIAGNOSIS — G319 Degenerative disease of nervous system, unspecified: Secondary | ICD-10-CM | POA: Diagnosis not present

## 2020-11-21 DIAGNOSIS — E46 Unspecified protein-calorie malnutrition: Secondary | ICD-10-CM

## 2020-11-21 DIAGNOSIS — E039 Hypothyroidism, unspecified: Secondary | ICD-10-CM | POA: Diagnosis not present

## 2020-11-21 DIAGNOSIS — Z885 Allergy status to narcotic agent status: Secondary | ICD-10-CM

## 2020-11-21 DIAGNOSIS — E1122 Type 2 diabetes mellitus with diabetic chronic kidney disease: Secondary | ICD-10-CM | POA: Diagnosis not present

## 2020-11-21 DIAGNOSIS — E669 Obesity, unspecified: Secondary | ICD-10-CM | POA: Diagnosis present

## 2020-11-21 DIAGNOSIS — R0689 Other abnormalities of breathing: Secondary | ICD-10-CM | POA: Diagnosis not present

## 2020-11-21 DIAGNOSIS — D539 Nutritional anemia, unspecified: Secondary | ICD-10-CM | POA: Diagnosis not present

## 2020-11-21 LAB — URINALYSIS, ROUTINE W REFLEX MICROSCOPIC
Bacteria, UA: NONE SEEN
Bilirubin Urine: NEGATIVE
Glucose, UA: NEGATIVE mg/dL
Hgb urine dipstick: NEGATIVE
Ketones, ur: NEGATIVE mg/dL
Leukocytes,Ua: NEGATIVE
Nitrite: NEGATIVE
Protein, ur: 30 mg/dL — AB
Specific Gravity, Urine: 1.025 (ref 1.005–1.030)
pH: 5 (ref 5.0–8.0)

## 2020-11-21 LAB — CBC WITH DIFFERENTIAL/PLATELET
Abs Immature Granulocytes: 0.02 10*3/uL (ref 0.00–0.07)
Basophils Absolute: 0.1 10*3/uL (ref 0.0–0.1)
Basophils Relative: 1 %
Eosinophils Absolute: 0.1 10*3/uL (ref 0.0–0.5)
Eosinophils Relative: 2 %
HCT: 32.5 % — ABNORMAL LOW (ref 36.0–46.0)
Hemoglobin: 9.6 g/dL — ABNORMAL LOW (ref 12.0–15.0)
Immature Granulocytes: 0 %
Lymphocytes Relative: 21 %
Lymphs Abs: 1.2 10*3/uL (ref 0.7–4.0)
MCH: 30.4 pg (ref 26.0–34.0)
MCHC: 29.5 g/dL — ABNORMAL LOW (ref 30.0–36.0)
MCV: 102.8 fL — ABNORMAL HIGH (ref 80.0–100.0)
Monocytes Absolute: 0.7 10*3/uL (ref 0.1–1.0)
Monocytes Relative: 12 %
Neutro Abs: 3.8 10*3/uL (ref 1.7–7.7)
Neutrophils Relative %: 64 %
Platelets: 209 10*3/uL (ref 150–400)
RBC: 3.16 MIL/uL — ABNORMAL LOW (ref 3.87–5.11)
RDW: 15.3 % (ref 11.5–15.5)
WBC: 6 10*3/uL (ref 4.0–10.5)
nRBC: 0 % (ref 0.0–0.2)

## 2020-11-21 LAB — COMPREHENSIVE METABOLIC PANEL
ALT: 13 U/L (ref 0–44)
AST: 14 U/L — ABNORMAL LOW (ref 15–41)
Albumin: 3.3 g/dL — ABNORMAL LOW (ref 3.5–5.0)
Alkaline Phosphatase: 56 U/L (ref 38–126)
Anion gap: 6 (ref 5–15)
BUN: 13 mg/dL (ref 8–23)
CO2: 33 mmol/L — ABNORMAL HIGH (ref 22–32)
Calcium: 9.1 mg/dL (ref 8.9–10.3)
Chloride: 100 mmol/L (ref 98–111)
Creatinine, Ser: 0.74 mg/dL (ref 0.44–1.00)
GFR, Estimated: >60 mL/min (ref >60)
Glucose, Bld: 180 mg/dL — ABNORMAL HIGH (ref 70–99)
Potassium: 4.7 mmol/L (ref 3.5–5.1)
Sodium: 139 mmol/L (ref 135–145)
Total Bilirubin: 0.4 mg/dL (ref 0.3–1.2)
Total Protein: 6.3 g/dL — ABNORMAL LOW (ref 6.5–8.1)

## 2020-11-21 LAB — BRAIN NATRIURETIC PEPTIDE: B Natriuretic Peptide: 425 pg/mL — ABNORMAL HIGH (ref 0.0–100.0)

## 2020-11-21 LAB — CBG MONITORING, ED: Glucose-Capillary: 180 mg/dL — ABNORMAL HIGH (ref 70–99)

## 2020-11-21 MED ORDER — FUROSEMIDE 10 MG/ML IJ SOLN
40.0000 mg | Freq: Once | INTRAMUSCULAR | Status: AC
  Administered 2020-11-21: 40 mg via INTRAVENOUS
  Filled 2020-11-21: qty 4

## 2020-11-21 MED ORDER — ACETAMINOPHEN 325 MG PO TABS
650.0000 mg | ORAL_TABLET | Freq: Once | ORAL | Status: AC
  Administered 2020-11-21: 650 mg via ORAL
  Filled 2020-11-21: qty 2

## 2020-11-21 MED ORDER — ENOXAPARIN SODIUM 40 MG/0.4ML IJ SOSY: 40.0000 mg | PREFILLED_SYRINGE | INTRAMUSCULAR | Status: DC

## 2020-11-21 MED ORDER — CEPHALEXIN 500 MG PO CAPS: 500.0000 mg | ORAL_CAPSULE | Freq: Two times a day (BID) | ORAL | 0 refills | Status: DC

## 2020-11-21 NOTE — Patient Instructions (Signed)
Confusion Confusion is the inability to think with your usual speed or clarity. Confusion can be caused by many things. People who are confused often describe their thinking as cloudy or unclear. Confusion can also include feeling disoriented. This means you are unaware of where you are or who you are. You may also not know the date or time. When confused, you may have trouble remembering, paying attention, or making decisions. Some people also act aggressively when they are confused. In some cases, confusion may come on quickly. In other cases, it may develop slowly over time. Confusion may be caused by medical conditions such as:  Infections, such as a urinary tract infection (UTI).  Low levels of oxygen, which can develop from conditions such as long-term lung disorders.  Decrease in brain function due to dementia and other conditions that affect the brain, such as seizures, strokes, brain tumors, or head injuries.  Mental health conditions, like panic attacks, anxiety, depression, and hallucinations. Confusion may also be caused by physical factors such as:  Loss of fluid (dehydration) or an imbalance of salts and minerals in the body (electrolytes).  Lack of certain nutrients like niacin, thiamine, or other B vitamins.  Fever or hypothermia, which is a sudden drop in body temperature.  Low or high blood sugar.  Low or high blood pressure. Other causes include:  Lack of sleep or changes in routine or surroundings, such as when traveling or staying in a hospital.  Using too much alcohol, drugs, or medicine.  Side effects of medicines, or taking medicines that affect other medicines (drug interactions). Follow these instructions at home: Pay attention to your symptoms. Tell your health care provider about any changes or if you develop new symptoms. Follow these instructions to control or treat symptoms. Ask a family member or friend for help if needed. Medicines  Take  over-the-counter and prescription medicines only as told by your health care provider.  Ask your health care provider about changing or stopping any medicines that may be causing your confusion.  Avoid pain medicines or sleep medicines until you have fully recovered.  Use a pillbox or an alarm to help you take the right medicines at the right time.   Lifestyle  Eat a balanced diet that includes fruits and vegetables.  Get enough sleep. For most adults, this is 7-9 hours each night.  Do not drink alcohol.  Do not become isolated. Spend time with other people and make plans for your days.  Do not drive until your health care provider says that it is safe to do so.  Do not use any products that contain nicotine or tobacco, such as cigarettes, e-cigarettes, and chewing tobacco. If you need help quitting, ask your health care provider.  Stop other activities that may increase your chances of getting hurt. These may include some work duties, sports activities, swimming, or bike riding. Ask your health care provider what activities are safe for you.   Tips for caregivers  Find out if the person is confused. Ask the person to state his or her name, age, and the date. If the person is unsure or answers incorrectly, he or she may be confused and need assistance.  Always introduce yourself, no matter how well the person knows you. Remind the person of his or her location.  Place a calendar and clock near the person who is confused. Keep a regular schedule. Make sure the person has plenty of light during the day and sleep at night.    Talk about current events and plans for the day.  Keep the environment calm, quiet, and peaceful.  Help the person do the things that he or she is unable to do. These include: ? Taking medicines. ? Keeping medical appointments. ? Helping with household duties, including meal preparation. ? Running errands.  Get help if you need it. There are several support  groups for caregivers. If the person you are helping needs more support, consider day care, extended-care programs, or a skilled nursing facility. The person's health care provider may be able to help evaluate these options. General instructions  Monitor yourself for any conditions you may have. These can include: ? Checking your blood glucose levels if you have diabetes. ? Maintaining a healthy weight. ? Monitoring your blood pressure if you have hypertension. ? Monitoring your body temperature if you have a fever.  Keep all follow-up visits. This is important. Contact a health care provider if:  You have new symptoms or your symptoms get worse. Get help right away if you:  Feel that you are not able to care for yourself.  Develop severe headaches, repeated vomiting, seizures, blackouts, or slurred speech.  Have increasing confusion, weakness, numbness, restlessness, or personality changes.  Develop a loss of balance, have marked dizziness, feel uncoordinated, or fall.  Develop severe anxiety, or you have delusions or hallucinations. These symptoms may represent a serious problem that is an emergency. Do not wait to see if the symptoms will go away. Get medical help right away. Call your local emergency services (911 in the [place] Do not drive yourself to the hospital. Summary  Confusion is the inability to think with your usual speed or clarity. People who are confused often describe their thinking as cloudy or unclear.  Confusion can also include having trouble remembering, paying attention, or making decisions.  Confusion may come on quickly or develop slowly over time, depending on the cause. There are many different causes of confusion.  Ask for help from family members or friends if you are unable to take care of yourself. This information is not intended to replace advice given to you by your health care provider. Make sure you discuss any questions you have with your health  care provider. Document Revised: 10/15/2019 Document Reviewed: 10/15/2019 Elsevier Patient Education  2021 [employer] Inc.  

## 2020-11-21 NOTE — Progress Notes (Addendum)
Pt to unit via stretcher at this time, pt answered all questions appropriately at this time, pt is alert. Pt stated she was at [facility] because of her heart and CHF. Pt has noted stage 1 pressure ulcer to gluteal crease with foam and barrier cream applied. Pt has noted redness to vaginal folds and under abdomen at this time. Pt wet with urine upon arrival to floor, bath and linen change completed at this time, VSS, Chauncey intact. Daughter denise at bedside and states pt is still recovering from her last admission and uses a walker with assistance at home, COVID vaccinated and boosted. SCD's placed on patient at this time. [name] states last seizure was in 2010 and pt has not had ativan in the last 2 to 3 months. Pt lives with her and her spouse. Last BM today [id number].  Non pitting edema to BLE. Bedside swallow passed. Bed alarm on. Will continue to monitor.

## 2020-11-21 NOTE — ED Notes (Addendum)
CBG - 180 , hg

## 2020-11-21 NOTE — Progress Notes (Signed)
[name], [name] are scheduled for a virtual visit with your provider today.    Just as we do with appointments in the office, we must obtain your consent to participate.  Your consent will be active for this visit and any virtual visit you may have with one of our providers in the next 365 days.    If you have a MyChart account, I can also send a copy of this consent to you electronically.  All virtual visits are billed to your insurance company just like a traditional visit in the office.  As this is a virtual visit, video technology does not allow for your provider to perform a traditional examination.  This may limit your provider's ability to fully assess your condition.  If your provider identifies any concerns that need to be evaluated in person or the need to arrange testing such as labs, EKG, etc, we will make arrangements to do so.    Although advances in technology are sophisticated, we cannot ensure that it will always work on either your end or our end.  If the connection with a video visit is poor, we may have to switch to a telephone visit.  With either a video or telephone visit, we are not always able to ensure that we have a secure connection.   I need to obtain your verbal consent now.   Are you willing to proceed with your visit today?   [name] has provided verbal consent on 11/21/2020 for a virtual visit (video or telephone).   Tara Loveless, PA-C 11/21/2020  8:35 AM    MyChart Video Visit    Virtual Visit via Video Note   This visit type was conducted due to national recommendations for restrictions regarding the COVID-19 Pandemic (e.g. social distancing) in an effort to limit this Tara's exposure and mitigate transmission in our community. This Tara is at least at moderate risk for complications without adequate follow up. This format is felt to be most appropriate for this Tara at this time. Physical exam was limited by quality of the video and audio  technology used for the visit.   Visit was conducted with Tara's daughter, [name], due to [name]. Tara was present and helped answer questions  Tara location: Home Provider location: Home office in [place]  I discussed the limitations of evaluation and management by telemedicine and the availability of in person appointments. The Tara expressed understanding and agreed to proceed.  Tara: [name]   DOB: 02-10-1951   70 y.o. Female  MRN: [id number] Visit Date: 11/21/2020  Today's healthcare provider: [name], PA-C   No chief complaint on file.  Subjective    Urinary Tract Infection  This is a new problem. The current episode started yesterday. The Tara is experiencing no pain. There has been no fever. Associated symptoms include nausea. Pertinent negatives include no chills. She has tried increased fluids for the symptoms. The treatment provided no relief.    [name], Tara's daughter, mentions that last night her mother was more confused. Did not sleep thinking it was day time and could not understand why it was dark outside. She was up and down a lot. She was recently started on Memantine 5mg  BID on 11/16/20. She is unsure if she may be having a side effect to the medication vs infection vs progression of [name].   Tara Active Problem List   Diagnosis Date Noted  . Acute metabolic encephalopathy 08/30/2020  . Acute  ischemic stroke (HCC)   . Respiratory failure with hypoxia (HCC) 08/24/2020  . Lobar pneumonia (HCC) 08/24/2020  . Septic shock (HCC) 08/24/2020  . NSTEMI (non-ST elevated myocardial infarction) (HCC) 08/17/2020  . Constipation 03/17/2020  . Knee pain 10/07/2019  . Fibromyalgia 10/07/2019  . Polyarthropathy 10/07/2019  . Fibromyositis 09/30/2019  . Abnormal gait due to muscle weakness 09/30/2019  . Pain in right knee 09/30/2019  . Depression 08/31/2018  . Anxiety 08/31/2018  . Chronic respiratory failure  with hypoxia and hypercapnia (HCC) 12/13/2017  . Acute systolic HF (heart failure) (HCC) 11/26/2017  . Palliative care by specialist   . Goals of care, counseling/discussion   . Acute on chronic diastolic CHF (congestive heart failure) (HCC) 08/17/2017  . Acute respiratory failure with hypoxia and hypercapnia (HCC) 08/16/2017  . Uncontrolled type 2 diabetes mellitus with hyperglycemia (HCC) 08/16/2017  . Acute respiratory failure (HCC)   . CAP (community acquired pneumonia) 08/05/2017  . COPD exacerbation (HCC) 08/05/2017  . Pulmonary hypertension (HCC) 04/05/2017  . Derangement of posterior horn of medial meniscus of right knee   . Lateral meniscus, anterior horn derangement, right   . Syncope, near 12/26/2015  . Fall at home 12/26/2015  . Dysphagia 12/07/2015  . IDA (iron deficiency anemia) 03/13/2015  . Chronic diastolic CHF (congestive heart failure) (HCC) 02/20/2015  . Diffuse abdominal pain 09/16/2014  . Ascites 08/27/2014  . Anemia 07/20/2014  . Epistaxis, recurrent 07/20/2014  . Seizure (HCC) 04/11/2014  . [name] (HCC) 04/11/2014  . Chronic diarrhea 04/11/2014  . DM type 2 (diabetes mellitus, type 2) (HCC) 04/11/2014  . Protein-calorie malnutrition, severe (HCC) 04/11/2014  . Abnormal weight loss 08/26/2013  . Lower abdominal pain 08/26/2013  . Anorexia nervosa 08/26/2013  . Encounter for therapeutic drug monitoring 08/26/2013  . DOE (dyspnea on exertion) 06/29/2013  . Tremor 10/27/2012  . Chronic anticoagulation 06/23/2011  . HYPOTENSION, ORTHOSTATIC 08/26/2009  . Hyperlipidemia 03/04/2009  . Barrett's esophagus 09/26/2008  . Gastroparesis 09/26/2008  . Iron deficiency anemia 09/25/2008  . Essential hypertension 09/25/2008  . LUNG NODULE 09/25/2008  . Irritable bowel syndrome 09/25/2008  . Sleep apnea 09/25/2008  . CARPAL TUNNEL SYNDROME, HX OF 09/25/2008  . Hypothyroidism 04/10/2008  . Body mass index (BMI) 40.0-44.9, adult (HCC) 04/10/2008  . Atrial  fibrillation (HCC) 04/10/2008  . Congestive heart failure (HCC) 04/10/2008  . Low back pain 04/10/2008  . CHEST PAIN-PRECORDIAL 04/10/2008  . Obesity 04/10/2008   Past Medical History:  Diagnosis Date  . Abnormal pulmonary function test   . Anemia    H/H of 10/30 with a normal MCV in 12/09  . Anxiety   . Arthritis   . Barrett's esophagus    Diagnosed 1995. Last EGD 2016-NO BARRETT'S.   . Chest pain    Negative cardiac catheterization in 2002; negative stress nuclear study in 2008  . Chronic anticoagulation   . Chronic combined systolic and diastolic CHF (congestive heart failure) (HCC)    a. EF predominantly normal during prior echoes but was 40% during 10/2014 echo. b. Most recent 01/2015 EF normal, 55-60%.  . Chronic LBP    Surgical intervention in 1996  . Diabetes mellitus, type 2 (HCC)    Insulin therapy; exacerbated by prednisone  . Dysrhythmia    AFib  . Gastroparesis    99% retention 05/2008 on GES  . GERD (gastroesophageal reflux disease)   . Heart attack (HCC) 08/13/2020  . Hiatal hernia   . Hyperlipidemia   . Hypertension   . Hypothyroid   .  IBS (irritable bowel syndrome)   . Obesity   . OSA on CPAP    had CPAP and cannot tolerate.  . Paroxysmal atrial fibrillation (HCC)   . Pulmonary hypertension (HCC) 01/2015   a. Predominantly pulmonary venous hypertension but may be component of PAH.  . Seizures (HCC)    last seizure was 2 years ago; on keppra for this; unknown etiology  . Syncope    a. Admitted 05/2009; magnetic resonance imagin/ MRA - negative; etiology thought to be orthostasis secondary to drugs and dehydration. b. Syncope 02/2015 also felt 2/2 dehydration.      Medications: Outpatient Medications Prior to Visit  Medication Sig  . acetaminophen (TYLENOL) 500 MG tablet Take 500 mg by mouth as needed for moderate pain or headache.   Marland Kitchen amiodarone (PACERONE) 200 MG tablet TAKE 1 TABLET BY MOUTH EVERY MORNING  . apixaban (ELIQUIS) 5 MG TABS tablet Take 1  tablet (5 mg total) by mouth 2 (two) times daily.  Marland Kitchen atorvastatin (LIPITOR) 80 MG tablet Place 1 tablet (80 mg total) into feeding tube daily.  . Cholecalciferol (VITAMIN D3) 2000 units capsule Take 2,000 Units by mouth daily.  . Cyanocobalamin 5000 MCG SUBL Take 1 tablet by mouth daily.  Marland Kitchen donepezil (ARICEPT) 5 MG tablet Take 5 mg by mouth daily.  . DULoxetine (CYMBALTA) 30 MG capsule Take 30 mg by mouth 2 (two) times daily.  . feeding supplement (ENSURE ENLIVE / ENSURE PLUS) LIQD Take 237 mLs by mouth 3 (three) times daily between meals.  . furosemide (LASIX) 20 MG tablet Take 1 tablet (20 mg total) by mouth daily.  Marland Kitchen gabapentin (NEURONTIN) 100 MG capsule Take 1 capsule (100 mg total) by mouth 3 (three) times daily.  . insulin glargine (LANTUS) 100 UNIT/ML injection Inject 0.1 mLs (10 Units total) into the skin daily.  . Lactase 9000 units CHEW 1 PO Q4H WHEN YOU EAT ICE CREAM OR CHEESE (Tara taking differently: No sig reported)  . levETIRAcetam (KEPPRA) 500 MG tablet Take 2 tablets (1,000 mg total) by mouth 2 (two) times daily.  Marland Kitchen levothyroxine (SYNTHROID) 175 MCG tablet Take 1 tablet by mouth daily.  Marland Kitchen loperamide (IMODIUM) 2 MG capsule 1 PO Q4H PRN LOOSE STOOLS (Tara taking differently: Take 4 mg by mouth every 4 (four) hours as needed. 1 PO Q4H PRN LOOSE STOOLS)  . LORazepam (ATIVAN) 0.5 MG tablet Take 0.5 mg by mouth every 8 (eight) hours.  . magnesium oxide (MAG-OX) 400 MG tablet Take 1 tablet (400 mg total) by mouth 2 (two) times daily. (Tara not taking: Reported on 10/25/2020)  . meclizine (ANTIVERT) 25 MG tablet Take 1 tablet (25 mg total) by mouth every 8 (eight) hours as needed for dizziness.  . Multiple Vitamin (MULTIVITAMIN WITH MINERALS) TABS tablet Take 1 tablet by mouth daily.  . ondansetron (ZOFRAN) 4 MG tablet TAKE 1 TABLET BY MOUTH FOUR TIMES DAILY AS NEEDED FOR NAUSEA (Tara taking differently: No sig reported)  . OXYGEN 2 L daily.   . pantoprazole (PROTONIX) 40 MG  tablet TAKE ONE TABLET BY MOUTH TWICE DAILY BEFORE APPLY MEAL (Tara taking differently: Take 40 mg by mouth 2 (two) times daily.)  . potassium chloride (KLOR-CON) 10 MEQ tablet Take 20 mEq by mouth daily.  Marland Kitchen SM MELATONIN 3 MG TABS tablet Take 3 mg by mouth at bedtime as needed for sleep.   No facility-administered medications prior to visit.    Review of Systems  Constitutional: Negative for appetite change, chills and fatigue.  HENT: Negative.   Respiratory: Negative.   Cardiovascular: Negative.   Gastrointestinal: Positive for nausea.  Psychiatric/Behavioral: Positive for confusion.    Last CBC Lab Results  Component Value Date   WBC 7.2 10/25/2020   HGB 10.4 (L) 10/25/2020   HCT 35.0 (L) 10/25/2020   MCV 106.1 (H) 10/25/2020   MCH 31.5 10/25/2020   RDW 15.5 10/25/2020   PLT 234 10/25/2020   Last metabolic panel Lab Results  Component Value Date   GLUCOSE 194 (H) 10/25/2020   NA 140 10/25/2020   K 4.8 10/25/2020   CL 105 10/25/2020   CO2 30 10/25/2020   BUN 11 10/25/2020   CREATININE 0.79 10/25/2020   GFRNONAA >60 10/25/2020   GFRAA 50 (L) 11/16/2019   CALCIUM 8.9 10/25/2020   PHOS 4.3 09/10/2020   PROT 6.0 (L) 10/25/2020   ALBUMIN 3.1 (L) 10/25/2020   LABGLOB 3.1 05/11/2018   AGRATIO 1.1 05/11/2018   BILITOT 0.6 10/25/2020   ALKPHOS 55 10/25/2020   AST 14 (L) 10/25/2020   ALT 12 10/25/2020   ANIONGAP 5 10/25/2020      Objective    There were no vitals taken for this visit. BP Readings from Last 3 Encounters:  10/26/20 (!) 136/103  10/16/20 120/72  09/22/20 113/80   Wt Readings from Last 3 Encounters:  10/25/20 217 lb (98.4 kg)  10/16/20 217 lb (98.4 kg)  09/22/20 214 lb 8.1 oz (97.3 kg)      Physical Exam     Assessment & Plan     1. Suspected UTI - Unsure if this is source, but daughter mentions she has had this before once - Will give Keflex as below for possible infection - Advised daughter to hold Keflex, and do a trial off the  memantine to see if these symptoms improved. If not, start Keflex. - Strictly advised if symptoms progress at all she needs to be seen immediately with UC or ER, daughter voiced understanding - Strict return precautions to follow up with PCP or Neurologist next week.  - cephALEXin (KEFLEX) 500 MG capsule; Take 1 capsule (500 mg total) by mouth 2 (two) times daily.  Dispense: 14 capsule; Refill: 0  2. Altered mental status, unspecified altered mental status type - See above medical treatment plan.  3. [name] without behavioral disturbance, unspecified [name] type (HCC) - See above medical treatment plan.   No follow-ups on file.     I discussed the assessment and treatment plan with the Tara. The Tara was provided an opportunity to ask questions and all were answered. The Tara agreed with the plan and demonstrated an understanding of the instructions.   The Tara was advised to call back or seek an in-person evaluation if the symptoms worsen or if the condition fails to improve as anticipated.  I provided 19 minutes of face-to-face time during this encounter via MyChart Video enabled encounter.   Reine Just [facility] [phone] (phone) [phone] (fax)  [facility]

## 2020-11-21 NOTE — H&P (Incomplete)
History and Physical  [name] YSH:[id number] DOB: 09-29-1950 DOA: 11/21/2020  Referring physician: Burgess Amor, PA-C PCP: Gareth Morgan, MD  Patient coming from: Home  Chief Complaint: Confusion  HPI: [name] is a 70 y.o. female with medical history significant for CHF (LVEF on 08/29/2020 was 50 to 55% with no RWMA), pulm hypertension, atrial fibrillation, hypertension, hyperlipidemia, T2DM, dementia, hypothyroidism and chronic pain who presents to the emergency department via EMS due to a 2-day history of confusion, increased unsteady gait, occasional slurred speech and occasional visual hallucination.  History was obtained from ED PA and daughter at bedside, per daughter, patient started to present with increased confusion 2 days ago (11/19/2020) and she was noted to have occasional slurred speech and to have meaningless conversation that was completely off engaged topic.  Patient was recently started on Namenda about a week ago and has had no other changes in medication.  She ambulates with a walker, but gait recently became more unsteady, though she has not sustained any fall.  She denies chest pain, fever, chills, nausea, vomiting or abdominal pain.  ED Course:  In the Emergency Department, she was tachypneic and intermittently tachycardic.  CBC showed macrocytic anemia, normal BMP except for hyperglycemia.  BNP 425, urinalysis unimpressive for UTI. CT head without contrast showed no evidence of acute intracranial abnormality Chest x-ray showed findings suggestive of CHF with mild edema as well as bandlike opacities within the perihilar and bibasilar regions, likely atelectasis Tylenol was given, IV Lasix 40 Mg x1 was given. [facility]rologist at [facility] was consulted by ED PA and recommended that patient could be admitted here at [facility] since patient's symptoms appear to be related to [facility] that was recently started.  MRI on Monday if patient's symptoms persist despite discontinuing this  medication, benzos, any opiates or cholinergics was recommended.  Hospitalist was asked to admit patient for further evaluation and management.  Review of Systems: Constitutional: Negative for chills and fever.  HENT: Negative for ear pain and sore throat.   Eyes: Negative for pain and visual disturbance.  Respiratory: Negative for cough, chest tightness and shortness of breath.   Cardiovascular: Negative for chest pain and palpitations.  Gastrointestinal: Negative for abdominal pain and vomiting.  Endocrine: Negative for polyphagia and polyuria.  Genitourinary: Negative for decreased urine volume, dysuria, enuresis Musculoskeletal: Positive for gait problems.  Negative for arthralgias  Skin: Negative for color change and rash.  Allergic/Immunologic: Negative for immunocompromised state.  Neurological: Positive for confusion and occasional speech difficulty.  Negative for tremors, syncope,  headaches.  Hematological: Does not bruise/bleed easily.  All other systems reviewed and are negative   Past Medical History:  Diagnosis Date  . Abnormal pulmonary function test   . Anemia    H/H of 10/30 with a normal MCV in 12/09  . Anxiety   . Arthritis   . Barrett's esophagus    Diagnosed 1995. Last EGD 2016-NO BARRETT'S.   . Chest pain    Negative cardiac catheterization in 2002; negative stress nuclear study in 2008  . Chronic anticoagulation   . Chronic combined systolic and diastolic CHF (congestive heart failure) (HCC)    a. EF predominantly normal during prior echoes but was 40% during 10/2014 echo. b. Most recent 01/2015 EF normal, 55-60%.  . Chronic LBP    Surgical intervention in 1996  . Diabetes mellitus, type 2 (HCC)    Insulin therapy; exacerbated by prednisone  . Dysrhythmia    AFib  . Gastroparesis  99% retention 05/2008 on GES  . GERD (gastroesophageal reflux disease)   . Heart attack (HCC) 08/13/2020  . Hiatal hernia   . Hyperlipidemia   . Hypertension   .  Hypothyroid   . IBS (irritable bowel syndrome)   . Obesity   . OSA on CPAP    had CPAP and cannot tolerate.  . Paroxysmal atrial fibrillation (HCC)   . Pulmonary hypertension (HCC) 01/2015   a. Predominantly pulmonary venous hypertension but may be component of PAH.  . Seizures (HCC)    last seizure was 2 years ago; on keppra for this; unknown etiology  . Syncope    a. Admitted 05/2009; magnetic resonance imagin/ MRA - negative; etiology thought to be orthostasis secondary to drugs and dehydration. b. Syncope 02/2015 also felt 2/2 dehydration.   Past Surgical History:  Procedure Laterality Date  . BACK SURGERY  1996  . BIOPSY N/A 11/08/2013   Procedure: BIOPSY  / Tissue sampling / ulcers present in small intestine;  Surgeon: West Bali, MD;  Location: [facility] ENDO SUITE;  Service: Endoscopy;  Laterality: N/A;  . CARDIAC CATHETERIZATION  2002  . CARDIAC CATHETERIZATION N/A 01/26/2015   Procedure: Right Heart Cath;  Surgeon: Laurey Morale, MD;  Location: [facility] INVASIVE CV LAB;  Service: Cardiovascular;  Laterality: N/A;  . CARDIOVASCULAR STRESS TEST  2008   Stress nuclear study  . CARDIOVERSION N/A 03/06/2015   Procedure: CARDIOVERSION;  Surgeon: Laurey Morale, MD;  Location: [facility] ENDOSCOPY;  Service: Cardiovascular;  Laterality: N/A;  . CARPAL TUNNEL RELEASE  1994  . COLONOSCOPY  11/2011   Dr. Darrick Penna: Internal hemorrhoids, mild diverticulosis. Random colon biopsies negative.  . COLONOSCOPY N/A 11/08/2013   SLF: Normal mucosa in the terminal ileum/The colon IS redundant/  Moderate diverticulosis throughout the entire colon. ileum bx benign. colon bx benign  . ESOPHAGOGASTRODUODENOSCOPY  2008   Barrett's without dysplasia. esphagus dilated. antral erosions, h.pylori serologies negative.  . ESOPHAGOGASTRODUODENOSCOPY  11/2011   Dr. Darrick Penna: Barrett's esophagus, mild gastritis, diverticulum in the second portion of the duodenum repeat EGD 3 years. Small bowel biopsies negative. Gastric biopsy show  reactive gastropathy but no H. pylori. Esophageal biopsies consistent with GERD. Next EGD 11/2014  . ESOPHAGOGASTRODUODENOSCOPY N/A 11/21/2014   WUJ:WJXB non-erosive gastritis/irregular z-line  . GIVENS CAPSULE STUDY  12/07/2011   Proximal small bowel, rare AVM. Distal small bowel, multiple ulcers noted  . GIVENS CAPSULE STUDY N/A 09/27/2013   Distal small bowel ulcers extending to TI.  Marland Kitchen GIVENS CAPSULE STUDY N/A 10/10/2013   Procedure: GIVENS CAPSULE STUDY;  Surgeon: West Bali, MD;  Location: [facility] ENDO SUITE;  Service: Endoscopy;  Laterality: N/A;  7:30  . IR KYPHO THORACIC WITH BONE BIOPSY  02/09/2018  . KNEE ARTHROSCOPY WITH MEDIAL MENISECTOMY Right 06/09/2016   Procedure: KNEE ARTHROSCOPY WITH MEDIAL MENISECTOMY;  Surgeon: Vickki Hearing, MD;  Location: [facility] ORS;  Service: Orthopedics;  Laterality: Right;  medial and lateral menisectomy  . LAMINECTOMY  1995   L4-L5  . LAPAROSCOPIC CHOLECYSTECTOMY  1990s  . LEFT HEART CATHETERIZATION WITH CORONARY ANGIOGRAM  01/10/2014   Procedure: LEFT HEART CATHETERIZATION WITH CORONARY ANGIOGRAM;  Surgeon: Lesleigh Noe, MD;  Location: [facility] CATH LAB;  Service: Cardiovascular;;  . RIGHT HEART CATHETERIZATION N/A 01/10/2014   Procedure: RIGHT HEART CATH;  Surgeon: Lesleigh Noe, MD;  Location: [facility] CATH LAB;  Service: Cardiovascular;  Laterality: N/A;  . TOTAL ABDOMINAL HYSTERECTOMY  1999  . TRACHEOSTOMY TUBE PLACEMENT N/A 09/01/2017  Procedure: TRACHEOSTOMY;  Surgeon: Drema Halon, MD;  Location: [facility] OR;  Service: ENT;  Laterality: N/A;    Social History:  reports that she quit smoking about 37 years ago. Her smoking use included cigarettes. She started smoking about 54 years ago. She has a 3.75 pack-year smoking history. She has never used smokeless tobacco. She reports that she does not drink alcohol and does not use drugs.   Allergies  Allergen Reactions  . Citalopram Hydrobromide Other (See Comments)    Dyskinesia Other reaction(s): Other  (See Comments) Dyskinesia  . Codeine Nausea And Vomiting and Other (See Comments)    HALLUCINATIONS Other reaction(s): Unknown Other reaction(s): Other (See Comments) HALLUCINATIONS   . Hydromorphone Hcl Other (See Comments)    Made her pass out Other reaction(s): Other (See Comments) Made her pass out  . Metoclopramide Other (See Comments)    DYSKINESIA Other reaction(s): Other (See Comments) DYSKINESIA  . Hyoscyamine     MADE DIARRHEA WORSE    Family History  Problem Relation Age of Onset  . Hypertension Mother   . Alzheimer's disease Mother   . Stroke Mother   . Heart attack Mother   . Hypertension Other   . Breast cancer Sister   . Heart disease Neg Hx   . Colon cancer Neg Hx     ***  Prior to Admission medications   Medication Sig Start Date End Date Taking? Authorizing Provider  acetaminophen (TYLENOL) 500 MG tablet Take 500 mg by mouth as needed for moderate pain or headache.     [provider]  amiodarone (PACERONE) 200 MG tablet TAKE 1 TABLET BY MOUTH EVERY MORNING 03/03/20   Strader, [place] M, PA-C  apixaban (ELIQUIS) 5 MG TABS tablet Take 1 tablet (5 mg total) by mouth 2 (two) times daily. 02/14/20   Antoine Poche, MD  atorvastatin (LIPITOR) 80 MG tablet Place 1 tablet (80 mg total) into feeding tube daily. 09/22/20   Russella Dar, NP  cephALEXin (KEFLEX) 500 MG capsule Take 1 capsule (500 mg total) by mouth 2 (two) times daily. 11/21/20   [name], PA-C  Cholecalciferol (VITAMIN D3) 2000 units capsule Take 2,000 Units by mouth daily. 10/25/17   [provider]  donepezil (ARICEPT) 5 MG tablet Take 5 mg by mouth daily.    [provider]  DULoxetine (CYMBALTA) 30 MG capsule Take 30 mg by mouth 2 (two) times daily. 11/24/17   [provider]  feeding supplement (ENSURE ENLIVE / ENSURE PLUS) LIQD Take 237 mLs by mouth 3 (three) times daily between meals. 09/21/20   Russella Dar, NP  furosemide (LASIX) 20 MG  tablet Take 1 tablet (20 mg total) by mouth daily. 09/22/20   Russella Dar, NP  gabapentin (NEURONTIN) 100 MG capsule Take 1 capsule (100 mg total) by mouth 3 (three) times daily. 09/21/20   Russella Dar, NP  insulin glargine (LANTUS) 100 UNIT/ML injection Inject 0.1 mLs (10 Units total) into the skin daily. 09/21/20   Russella Dar, NP  Lactase 9000 units CHEW 1 PO Q4H WHEN YOU EAT ICE CREAM OR CHEESE Patient taking differently: No sig reported 09/05/19   West Bali, MD  levETIRAcetam (KEPPRA) 500 MG tablet Take 2 tablets (1,000 mg total) by mouth 2 (two) times daily. 09/21/20   Russella Dar, NP  levothyroxine (SYNTHROID) 175 MCG tablet Take 1 tablet by mouth daily. 05/20/19   [provider]  loperamide (IMODIUM) 2 MG capsule 1  PO Q4H PRN LOOSE STOOLS Patient taking differently: Take 4 mg by mouth every 4 (four) hours as needed. 1 PO Q4H PRN LOOSE STOOLS 09/05/19   Fields, Sandi L, MD  LORazepam (ATIVAN) 0.5 MG tablet Take 0.5 mg by mouth every 8 (eight) hours.    [provider]  magnesium oxide (MAG-OX) 400 MG tablet Take 1 tablet (400 mg total) by mouth 2 (two) times daily. Patient not taking: Reported on 10/25/2020 12/26/17   Antoine Poche, MD  meclizine (ANTIVERT) 25 MG tablet Take 1 tablet (25 mg total) by mouth every 8 (eight) hours as needed for dizziness. 10/16/20   Antoine Poche, MD  Multiple Vitamin (MULTIVITAMIN WITH MINERALS) TABS tablet Take 1 tablet by mouth daily.    [provider]  ondansetron (ZOFRAN) 4 MG tablet TAKE 1 TABLET BY MOUTH FOUR TIMES DAILY AS NEEDED FOR NAUSEA Patient taking differently: No sig reported 01/10/18   Gelene Mink, NP  OXYGEN 2 L daily.     [provider]  pantoprazole (PROTONIX) 40 MG tablet TAKE ONE TABLET BY MOUTH TWICE DAILY BEFORE APPLY MEAL Patient taking differently: Take 40 mg by mouth 2 (two) times daily. 02/07/17   Gelene Mink, NP  potassium chloride (KLOR-CON) 10 MEQ tablet Take 20 mEq  by mouth daily.    [provider]  SM MELATONIN 3 MG TABS tablet Take 3 mg by mouth at bedtime as needed for sleep. 08/22/20   [provider]    Physical Exam: BP 132/85 (BP Location: Right Arm)   Pulse 99   Temp 98 F (36.7 C) (Oral)   Resp (!) 21   Wt 103.2 kg Comment: was zerod out  SpO2 95%   BMI 36.72 kg/m   . General: 70 y.o. year-old female well developed well nourished in no acute distress.  Alert and oriented x3. Marland Kitchen HEENT: NCAT, EOMI . Neck: Supple, trachea medial . Cardiovascular: Regular rate and rhythm with no rubs or gallops.  No thyromegaly or JVD noted.  No lower extremity edema. 2/4 pulses in all 4 extremities. Marland Kitchen Respiratory: Clear to auscultation with no wheezes or rales. Good inspiratory effort. . Abdomen: Soft, nontender nondistended with normal bowel sounds x4 quadrants. . Muskuloskeletal: No cyanosis, clubbing or edema noted bilaterally . Neuro: CN II-XII intact, strength 5/5 x 4, sensation, reflexes intact, no focal neurologic deficit . Skin: No ulcerative lesions noted or rashes . Psychiatry: Mood is appropriate for condition and setting          Labs on Admission:  Basic Metabolic Panel: Recent Labs  Lab 11/21/20 1656  NA 139  K 4.7  CL 100  CO2 33*  GLUCOSE 180*  BUN 13  CREATININE 0.74  CALCIUM 9.1   Liver Function Tests: Recent Labs  Lab 11/21/20 1656  AST 14*  ALT 13  ALKPHOS 56  BILITOT 0.4  PROT 6.3*  ALBUMIN 3.3*   No results for input(s): LIPASE, AMYLASE in the last 168 hours. No results for input(s): AMMONIA in the last 168 hours. CBC: Recent Labs  Lab 11/21/20 1656  WBC 6.0  NEUTROABS 3.8  HGB 9.6*  HCT 32.5*  MCV 102.8*  PLT 209   Cardiac Enzymes: No results for input(s): CKTOTAL, CKMB, CKMBINDEX, TROPONINI in the last 168 hours.  BNP (last 3 results) Recent Labs    11/21/20 1656  BNP 425.0*    ProBNP (last 3 results) No results for input(s): PROBNP in the last 8760 hours.  CBG: Recent  Labs  Lab 11/21/20 1656  GLUCAP 180*    Radiological Exams on Admission: CT Head Wo Contrast  Result Date: 11/21/2020 CLINICAL DATA:  70 year old female with altered mental status. EXAM: CT HEAD WITHOUT CONTRAST TECHNIQUE: Contiguous axial images were obtained from the base of the skull through the vertex without intravenous contrast. COMPARISON:  10/25/2020 CT and prior studies FINDINGS: Brain: No evidence of acute infarction, hemorrhage, hydrocephalus, extra-axial collection or mass lesion/mass effect. Mild atrophy and chronic small-vessel white matter ischemic changes noted. Vascular: Carotid atherosclerotic calcifications are noted. Skull: Normal. Negative for fracture or focal lesion. Sinuses/Orbits: No acute finding. Other: None. IMPRESSION: 1. No evidence of acute intracranial abnormality. 2. Mild atrophy and chronic small-vessel white matter ischemic changes. Electronically Signed   By: Harmon Pier M.D.   On: 11/21/2020 17:28   DG Chest Port 1 View  Result Date: 11/21/2020 CLINICAL DATA:  Altered mental status EXAM: PORTABLE CHEST 1 VIEW COMPARISON:  10/25/2020 FINDINGS: Chronic cardiomegaly. Atherosclerotic calcification of the aortic knob. Mild pulmonary vascular congestion and bilateral interstitial prominence. Bandlike opacities within the perihilar and bibasilar regions. No new focal airspace consolidation. Possible trace left pleural effusion. No pneumothorax. Bones are demineralized. IMPRESSION: 1. Findings suggestive CHF with mild edema. 2. Bandlike opacities within the perihilar and bibasilar regions, likely atelectasis. Electronically Signed   By: Duanne Guess D.O.   On: 11/21/2020 17:09    EKG: I independently viewed the EKG done and my findings are as followed: A. fib with RVR and prolonged QTc (495 ms)  Assessment/Plan Present on Admission: . Altered mental status . Uncontrolled type 2 diabetes mellitus with hyperglycemia (HCC) . Dementia (HCC) . Hypothyroidism .  Hyperlipidemia . Essential hypertension . Obesity  Principal Problem:   Altered mental status Active Problems:   Hypothyroidism   Hyperlipidemia   Essential hypertension   Acute exacerbation of CHF (congestive heart failure) (HCC)   Atelectasis   Obesity   Dementia (HCC)   Macrocytic anemia   Uncontrolled type 2 diabetes mellitus with hyperglycemia (HCC)   Hypoalbuminemia due to protein-calorie malnutrition (HCC)   Pressure ulcer  Altered mental status with worsening dementia Patient was recently started on Namenda about a week ago  and she now presents with increased confusion, worsening gait, intermittent slurred and meaningless speech Neurology at [facility] was consulted by ED PA who recommended that patient could stay here at [facility], since symptoms appear to be related to drug effect All CNS drugs will be held at this time Continue fall precaution and neurochecks Consider MRI on Monday if patient continues to have above symptoms despite hold of meds   Acute exacerbation of CHF Possible atelectasis Hyperglycemia secondary to T2DM Macrocytic anemia Stage I pressure ulcer Hypoalbuminemia secondary to mild protein calorie malnutrition Essential hypertension Hypothyroidism Hyperlipidemia Obesity      DVT prophylaxis: ***   Code Status: ***   Family Communication: ***   Disposition Plan: ***   Consults called: ***   Admission status: ***     Frankey Shown MD [facility] Pager [phone]  If 7PM-7AM, please contact night-coverage [email or web address] Password [facility]  11/21/2020, 11:52 PM

## 2020-11-21 NOTE — H&P (Signed)
History and Physical  [name] YSH:[id number] DOB: 09-29-1950 DOA: 11/21/2020  Referring physician: Burgess Amor, PA-C PCP: Gareth Morgan, MD  Patient coming from: Home  Chief Complaint: Confusion  HPI: [name] is a 70 y.o. female with medical history significant for CHF (LVEF on 08/29/2020 was 50 to 55% with no RWMA), pulm hypertension, atrial fibrillation, hypertension, hyperlipidemia, T2DM, dementia, hypothyroidism and chronic pain who presents to the emergency department via EMS due to a 2-day history of confusion, increased unsteady gait, occasional slurred speech and occasional visual hallucination.  History was obtained from ED PA and daughter at bedside, per daughter, patient started to present with increased confusion 2 days ago (11/19/2020) and she was noted to have occasional slurred speech and to have meaningless conversation that was completely off engaged topic.  Patient was recently started on Namenda about a week ago and has had no other changes in medication.  She ambulates with a walker, but gait recently became more unsteady, though she has not sustained any fall.  She denies chest pain, fever, chills, nausea, vomiting or abdominal pain.  ED Course:  In the Emergency Department, she was tachypneic and intermittently tachycardic.  CBC showed macrocytic anemia, normal BMP except for hyperglycemia.  BNP 425, urinalysis unimpressive for UTI. CT head without contrast showed no evidence of acute intracranial abnormality Chest x-ray showed findings suggestive of CHF with mild edema as well as bandlike opacities within the perihilar and bibasilar regions, likely atelectasis Tylenol was given, IV Lasix 40 Mg x1 was given. Neurologist at [facility] was consulted by ED PA and recommended that patient could be admitted here at [facility] since patient's symptoms appear to be related to [facility] that was recently started.  MRI on Monday if patient's symptoms persist despite discontinuing this  medication, benzos, any opiates or cholinergics was recommended.  Hospitalist was asked to admit patient for further evaluation and management.  Review of Systems: Constitutional: Negative for chills and fever.  HENT: Negative for ear pain and sore throat.   Eyes: Negative for pain and visual disturbance.  Respiratory: Negative for cough, chest tightness and shortness of breath.   Cardiovascular: Negative for chest pain and palpitations.  Gastrointestinal: Negative for abdominal pain and vomiting.  Endocrine: Negative for polyphagia and polyuria.  Genitourinary: Negative for decreased urine volume, dysuria, enuresis Musculoskeletal: Positive for gait problems.  Negative for arthralgias  Skin: Negative for color change and rash.  Allergic/Immunologic: Negative for immunocompromised state.  Neurological: Positive for confusion and occasional speech difficulty.  Negative for tremors, syncope,  headaches.  Hematological: Does not bruise/bleed easily.  All other systems reviewed and are negative   Past Medical History:  Diagnosis Date  . Abnormal pulmonary function test   . Anemia    H/H of 10/30 with a normal MCV in 12/09  . Anxiety   . Arthritis   . Barrett's esophagus    Diagnosed 1995. Last EGD 2016-NO BARRETT'S.   . Chest pain    Negative cardiac catheterization in 2002; negative stress nuclear study in 2008  . Chronic anticoagulation   . Chronic combined systolic and diastolic CHF (congestive heart failure) (HCC)    a. EF predominantly normal during prior echoes but was 40% during 10/2014 echo. b. Most recent 01/2015 EF normal, 55-60%.  . Chronic LBP    Surgical intervention in 1996  . Diabetes mellitus, type 2 (HCC)    Insulin therapy; exacerbated by prednisone  . Dysrhythmia    AFib  . Gastroparesis  99% retention 05/2008 on GES  . GERD (gastroesophageal reflux disease)   . Heart attack (HCC) 08/13/2020  . Hiatal hernia   . Hyperlipidemia   . Hypertension   .  Hypothyroid   . IBS (irritable bowel syndrome)   . Obesity   . OSA on CPAP    had CPAP and cannot tolerate.  . Paroxysmal atrial fibrillation (HCC)   . Pulmonary hypertension (HCC) 01/2015   a. Predominantly pulmonary venous hypertension but may be component of PAH.  . Seizures (HCC)    last seizure was 2 years ago; on keppra for this; unknown etiology  . Syncope    a. Admitted 05/2009; magnetic resonance imagin/ MRA - negative; etiology thought to be orthostasis secondary to drugs and dehydration. b. Syncope 02/2015 also felt 2/2 dehydration.   Past Surgical History:  Procedure Laterality Date  . BACK SURGERY  1996  . BIOPSY N/A 11/08/2013   Procedure: BIOPSY  / Tissue sampling / ulcers present in small intestine;  Surgeon: West Bali, MD;  Location: [facility] ENDO SUITE;  Service: Endoscopy;  Laterality: N/A;  . CARDIAC CATHETERIZATION  2002  . CARDIAC CATHETERIZATION N/A 01/26/2015   Procedure: Right Heart Cath;  Surgeon: Laurey Morale, MD;  Location: [facility] INVASIVE CV LAB;  Service: Cardiovascular;  Laterality: N/A;  . CARDIOVASCULAR STRESS TEST  2008   Stress nuclear study  . CARDIOVERSION N/A 03/06/2015   Procedure: CARDIOVERSION;  Surgeon: Laurey Morale, MD;  Location: [facility] ENDOSCOPY;  Service: Cardiovascular;  Laterality: N/A;  . CARPAL TUNNEL RELEASE  1994  . COLONOSCOPY  11/2011   Dr. Darrick Penna: Internal hemorrhoids, mild diverticulosis. Random colon biopsies negative.  . COLONOSCOPY N/A 11/08/2013   SLF: Normal mucosa in the terminal ileum/The colon IS redundant/  Moderate diverticulosis throughout the entire colon. ileum bx benign. colon bx benign  . ESOPHAGOGASTRODUODENOSCOPY  2008   Barrett's without dysplasia. esphagus dilated. antral erosions, h.pylori serologies negative.  . ESOPHAGOGASTRODUODENOSCOPY  11/2011   Dr. Darrick Penna: Barrett's esophagus, mild gastritis, diverticulum in the second portion of the duodenum repeat EGD 3 years. Small bowel biopsies negative. Gastric biopsy show  reactive gastropathy but no H. pylori. Esophageal biopsies consistent with GERD. Next EGD 11/2014  . ESOPHAGOGASTRODUODENOSCOPY N/A 11/21/2014   WUJ:WJXB non-erosive gastritis/irregular z-line  . GIVENS CAPSULE STUDY  12/07/2011   Proximal small bowel, rare AVM. Distal small bowel, multiple ulcers noted  . GIVENS CAPSULE STUDY N/A 09/27/2013   Distal small bowel ulcers extending to TI.  Marland Kitchen GIVENS CAPSULE STUDY N/A 10/10/2013   Procedure: GIVENS CAPSULE STUDY;  Surgeon: West Bali, MD;  Location: [facility] ENDO SUITE;  Service: Endoscopy;  Laterality: N/A;  7:30  . IR KYPHO THORACIC WITH BONE BIOPSY  02/09/2018  . KNEE ARTHROSCOPY WITH MEDIAL MENISECTOMY Right 06/09/2016   Procedure: KNEE ARTHROSCOPY WITH MEDIAL MENISECTOMY;  Surgeon: Vickki Hearing, MD;  Location: [facility] ORS;  Service: Orthopedics;  Laterality: Right;  medial and lateral menisectomy  . LAMINECTOMY  1995   L4-L5  . LAPAROSCOPIC CHOLECYSTECTOMY  1990s  . LEFT HEART CATHETERIZATION WITH CORONARY ANGIOGRAM  01/10/2014   Procedure: LEFT HEART CATHETERIZATION WITH CORONARY ANGIOGRAM;  Surgeon: Lesleigh Noe, MD;  Location: [facility] CATH LAB;  Service: Cardiovascular;;  . RIGHT HEART CATHETERIZATION N/A 01/10/2014   Procedure: RIGHT HEART CATH;  Surgeon: Lesleigh Noe, MD;  Location: [facility] CATH LAB;  Service: Cardiovascular;  Laterality: N/A;  . TOTAL ABDOMINAL HYSTERECTOMY  1999  . TRACHEOSTOMY TUBE PLACEMENT N/A 09/01/2017  Procedure: TRACHEOSTOMY;  Surgeon: Drema Halon, MD;  Location[facility] OR;  Service: ENT;  Laterality: N/A;    Social History:  reports that she quit smoking about 37 years ago. Her smoking use included cigarettes. She started smoking about 54 years ago. She has a 3.75 pack-year smoking history. She has never used smokeless tobacco. She reports that she does not drink alcohol and does not use drugs.   Allergies  Allergen Reactions  . Citalopram Hydrobromide Other (See Comments)    Dyskinesia Other reaction(s): Other  (See Comments) Dyskinesia  . Codeine Nausea And Vomiting and Other (See Comments)    HALLUCINATIONS Other reaction(s): Unknown Other reaction(s): Other (See Comments) HALLUCINATIONS   . Hydromorphone Hcl Other (See Comments)    Made her pass out Other reaction(s): Other (See Comments) Made her pass out  . Metoclopramide Other (See Comments)    DYSKINESIA Other reaction(s): Other (See Comments) DYSKINESIA  . Hyoscyamine     MADE DIARRHEA WORSE    Family History  Problem Relation Age of Onset  . Hypertension Mother   . Alzheimer's disease Mother   . Stroke Mother   . Heart attack Mother   . Hypertension Other   . Breast cancer Sister   . Heart disease Neg Hx   . Colon cancer Neg Hx      Prior to Admission medications   Medication Sig Start Date End Date Taking? Authorizing Provider  acetaminophen (TYLENOL) 500 MG tablet Take 500 mg by mouth as needed for moderate pain or headache.     [provider]  amiodarone (PACERONE) 200 MG tablet TAKE 1 TABLET BY MOUTH EVERY MORNING 03/03/20   Strader, [place] M, PA-C  apixaban (ELIQUIS) 5 MG TABS tablet Take 1 tablet (5 mg total) by mouth 2 (two) times daily. 02/14/20   Antoine Poche, MD  atorvastatin (LIPITOR) 80 MG tablet Place 1 tablet (80 mg total) into feeding tube daily. 09/22/20   Russella Dar, NP  cephALEXin (KEFLEX) 500 MG capsule Take 1 capsule (500 mg total) by mouth 2 (two) times daily. 11/21/20   [name], PA-C  Cholecalciferol (VITAMIN D3) 2000 units capsule Take 2,000 Units by mouth daily. 10/25/17   [provider]  donepezil (ARICEPT) 5 MG tablet Take 5 mg by mouth daily.    [provider]  DULoxetine (CYMBALTA) 30 MG capsule Take 30 mg by mouth 2 (two) times daily. 11/24/17   [provider]  feeding supplement (ENSURE ENLIVE / ENSURE PLUS) LIQD Take 237 mLs by mouth 3 (three) times daily between meals. 09/21/20   Russella Dar, NP  furosemide (LASIX) 20 MG  tablet Take 1 tablet (20 mg total) by mouth daily. 09/22/20   Russella Dar, NP  gabapentin (NEURONTIN) 100 MG capsule Take 1 capsule (100 mg total) by mouth 3 (three) times daily. 09/21/20   Russella Dar, NP  insulin glargine (LANTUS) 100 UNIT/ML injection Inject 0.1 mLs (10 Units total) into the skin daily. 09/21/20   Russella Dar, NP  Lactase 9000 units CHEW 1 PO Q4H WHEN YOU EAT ICE CREAM OR CHEESE Patient taking differently: No sig reported 09/05/19   West Bali, MD  levETIRAcetam (KEPPRA) 500 MG tablet Take 2 tablets (1,000 mg total) by mouth 2 (two) times daily. 09/21/20   Russella Dar, NP  levothyroxine (SYNTHROID) 175 MCG tablet Take 1 tablet by mouth daily. 05/20/19   [provider]  loperamide (IMODIUM) 2 MG capsule 1 PO  Q4H PRN LOOSE STOOLS Patient taking differently: Take 4 mg by mouth every 4 (four) hours as needed. 1 PO Q4H PRN LOOSE STOOLS 09/05/19   Fields, Sandi L, MD  LORazepam (ATIVAN) 0.5 MG tablet Take 0.5 mg by mouth every 8 (eight) hours.    [provider]  magnesium oxide (MAG-OX) 400 MG tablet Take 1 tablet (400 mg total) by mouth 2 (two) times daily. Patient not taking: Reported on 10/25/2020 12/26/17   Antoine PocheBranch, Jonathan F, MD  meclizine (ANTIVERT) 25 MG tablet Take 1 tablet (25 mg total) by mouth every 8 (eight) hours as needed for dizziness. 10/16/20   Antoine PocheBranch, Jonathan F, MD  Multiple Vitamin (MULTIVITAMIN WITH MINERALS) TABS tablet Take 1 tablet by mouth daily.    [provider]  ondansetron (ZOFRAN) 4 MG tablet TAKE 1 TABLET BY MOUTH FOUR TIMES DAILY AS NEEDED FOR NAUSEA Patient taking differently: No sig reported 01/10/18   Gelene MinkBoone, Anna W, NP  OXYGEN 2 L daily.     [provider]  pantoprazole (PROTONIX) 40 MG tablet TAKE ONE TABLET BY MOUTH TWICE DAILY BEFORE APPLY MEAL Patient taking differently: Take 40 mg by mouth 2 (two) times daily. 02/07/17   Gelene MinkBoone, Anna W, NP  potassium chloride (KLOR-CON) 10 MEQ tablet Take 20 mEq  by mouth daily.    [provider]  SM MELATONIN 3 MG TABS tablet Take 3 mg by mouth at bedtime as needed for sleep. 08/22/20   [provider]    Physical Exam: BP 132/85 (BP Location: Right Arm)   Pulse 99   Temp 98 F (36.7 C) (Oral)   Resp (!) 21   Wt 103.2 kg Comment: was zerod out  SpO2 95%   BMI 36.72 kg/m   . General: 70 y.o. year-old female well developed well nourished in no acute distress.  Alert and oriented x3. Marland Kitchen. HEENT: NCAT, EOMI . Neck: Supple, trachea medial . Cardiovascular: Irregular rate and rhythm with no rubs or gallops.  Diaphoretic at bedside.  No thyromegaly noted.   2/4 pulses in all 4 extremities. Marland Kitchen. Respiratory: Pulseless to catch breath when speaking, bilateral mild rales (L > R) on auscultation.  No wheezes . Abdomen: Soft, nontender nondistended with normal bowel sounds x4 quadrants. . Muskuloskeletal: Bilateral trace edema in lower extremities.  No cyanosis or clubbing. . Neuro: CN II-XII intact, strength 5/5 x 4, sensation, reflexes intact, no focal neurologic deficit . Skin: No ulcerative lesions noted or rashes . Psychiatry: Mood is appropriate for condition and setting          Labs on Admission:  Basic Metabolic Panel: Recent Labs  Lab 11/21/20 1656  NA 139  K 4.7  CL 100  CO2 33*  GLUCOSE 180*  BUN 13  CREATININE 0.74  CALCIUM 9.1   Liver Function Tests: Recent Labs  Lab 11/21/20 1656  AST 14*  ALT 13  ALKPHOS 56  BILITOT 0.4  PROT 6.3*  ALBUMIN 3.3*   No results for input(s): LIPASE, AMYLASE in the last 168 hours. No results for input(s): AMMONIA in the last 168 hours. CBC: Recent Labs  Lab 11/21/20 1656  WBC 6.0  NEUTROABS 3.8  HGB 9.6*  HCT 32.5*  MCV 102.8*  PLT 209   Cardiac Enzymes: No results for input(s): CKTOTAL, CKMB, CKMBINDEX, TROPONINI in the last 168 hours.  BNP (last 3 results) Recent Labs    11/21/20 1656  BNP 425.0*    ProBNP (last 3 results) No results for input(s):  PROBNP in the last 8760 hours.  CBG: Recent Labs  Lab 11/21/20 1656 11/22/20 0022  GLUCAP 180* 147*    Radiological Exams on Admission: CT Head Wo Contrast  Result Date: 11/21/2020 CLINICAL DATA:  71 year old female with altered mental status. EXAM: CT HEAD WITHOUT CONTRAST TECHNIQUE: Contiguous axial images were obtained from the base of the skull through the vertex without intravenous contrast. COMPARISON:  10/25/2020 CT and prior studies FINDINGS: Brain: No evidence of acute infarction, hemorrhage, hydrocephalus, extra-axial collection or mass lesion/mass effect. Mild atrophy and chronic small-vessel white matter ischemic changes noted. Vascular: Carotid atherosclerotic calcifications are noted. Skull: Normal. Negative for fracture or focal lesion. Sinuses/Orbits: No acute finding. Other: None. IMPRESSION: 1. No evidence of acute intracranial abnormality. 2. Mild atrophy and chronic small-vessel white matter ischemic changes. Electronically Signed   By: Harmon Pier M.D.   On: 11/21/2020 17:28   DG Chest Port 1 View  Result Date: 11/21/2020 CLINICAL DATA:  Altered mental status EXAM: PORTABLE CHEST 1 VIEW COMPARISON:  10/25/2020 FINDINGS: Chronic cardiomegaly. Atherosclerotic calcification of the aortic knob. Mild pulmonary vascular congestion and bilateral interstitial prominence. Bandlike opacities within the perihilar and bibasilar regions. No new focal airspace consolidation. Possible trace left pleural effusion. No pneumothorax. Bones are demineralized. IMPRESSION: 1. Findings suggestive CHF with mild edema. 2. Bandlike opacities within the perihilar and bibasilar regions, likely atelectasis. Electronically Signed   By: Duanne Guess D.O.   On: 11/21/2020 17:09    EKG: I independently viewed the EKG done and my findings are as followed: A. fib with RVR and prolonged QTc (495 ms)  Assessment/Plan Present on Admission: . Altered mental status . Uncontrolled type 2 diabetes mellitus  with hyperglycemia (HCC) . Dementia (HCC) . Hypothyroidism . Hyperlipidemia . Essential hypertension . Obesity  Principal Problem:   Altered mental status Active Problems:   Hypothyroidism   Hyperlipidemia   Essential hypertension   Acute exacerbation of CHF (congestive heart failure) (HCC)   Atelectasis   GERD (gastroesophageal reflux disease)   Obesity   Seizure (HCC)   Dementia (HCC)   Macrocytic anemia   Uncontrolled type 2 diabetes mellitus with hyperglycemia (HCC)   Hypoalbuminemia due to protein-calorie malnutrition (HCC)   Pressure ulcer  Altered mental status with worsening dementia Patient was recently started on Namenda about a week ago  and she now presents with increased confusion, worsening gait, intermittent slurred and meaningless speech Neurology at [facility] was consulted by ED PA who recommended that patient could stay here at [facility], since symptoms appear to be related to drug effect All CNS drugs will be held at this time Continue fall precaution and neurochecks Consider MRI on Monday if patient continues to have above symptoms despite hold of meds  Acute exacerbation of CHF Chest x-ray showed findings suggestive of CHF with mild edema BNP 425 (chronically elevated; it was 263 on 11/16/2019) Patient complained of occasional abdominal swelling Continue total input/output, daily weights and fluid restriction Continue IV Lasix 40 twice daily Continue Cardiac diet  Echocardiogram done on 08/29/2020 showed LVEF of 50 to 55% with no RWMA.  Repeat echocardiogram will be done in the morning   Possible atelectasis Chest x-ray was suggestive of possible atelectasis Continue incentive telemetry  Hyperglycemia secondary to T2DM CBG 180, continue insulin sliding scale and hypoglycemic protocol  Macrocytic anemia MCV 102.8, folate and without B12 levels will be checked  Stage I pressure ulcer Continue wound care  Hypoalbuminemia secondary to mild protein calorie  malnutrition Albumin 3.3, protein supplements  will be provided  Chronic atrial fibrillation CHADs-Vasc score > 5 EKG showed A. fib with RVR Continue Eliquis and amiodarone per home regimen  Essential hypertension Continue Lasix  Hypothyroidism Continue Synthroid  Hyperlipidemia Continue Lipitor  Obesity (BMI 36.72) Patient will be advised diet and lifestyle modification when more stable  GERD Continue Protonix  History of seizures Continue Keppra per home regimen    DVT prophylaxis: Eliquis  Code Status: Full code  Family Communication: Daughter at bedside (all questions answered to satisfaction)  Disposition Plan:  Patient is from:                        home Anticipated DC to:                   SNF or family members home Anticipated DC date:               2-3 days Anticipated DC barriers:         Patient requires inpatient management due to altered mental status   Consults called: None  Admission status: Observation   Frankey Shown MD [facility]  11/22/2020, 12:30 AM

## 2020-11-21 NOTE — ED Triage Notes (Signed)
Pt started a new medication recently. Since starting the medication pt has became more dizzy. Daughter reports that the patient has had an unsteady gate and slurred speech since Thursday.

## 2020-11-21 NOTE — ED Provider Notes (Signed)
[facility] EMERGENCY DEPARTMENT Provider Note   CSN: [id number] Arrival date & time: 11/21/20  1533     History Chief Complaint  Patient presents with  . Dizziness    [name] is a 70 y.o. female with a history including mild dementia, CHF, type 2 diabetes, GERD, MI, hypertension, hyperlipidemia history of pulmonary embolism, CVA and history of well-controlled seizure disorder presenting with a 2-day history of dizziness in association with increased unsteady gait and episodes of slurred speech along with visual hallucinations which started 2 days ago.  Daughter at the bedside with whom patient lives has noticed increasing confusion, changes in her wake sleep cycle, confused and rambling thought processes during conversation along with episodes of slurred speech.  She also notes that the patient is visualizing objects and people when for example looking at the white painted wall in her room.  She started a new medication Namenda 1 week ago, she has had no other changes in her medications.  She denies fevers or chills, has no chest pain, no increasing shortness of breath, also no neck pain or stiffness, no nausea or vomiting, abdominal pain, dysuria.  She normally is ambulatory using a walker but the daughter has noticed she has been less steady with her gait the past 2 days.  She has had no recognized falls.  Does states she has had a mild frontal headache for the past several weeks, no other complaints of pain.  HPI     Past Medical History:  Diagnosis Date  . Abnormal pulmonary function test   . Anemia    H/H of 10/30 with a normal MCV in 12/09  . Anxiety   . Arthritis   . Barrett's esophagus    Diagnosed 1995. Last EGD 2016-NO BARRETT'S.   . Chest pain    Negative cardiac catheterization in 2002; negative stress nuclear study in 2008  . Chronic anticoagulation   . Chronic combined systolic and diastolic CHF (congestive heart failure) (HCC)    a. EF predominantly normal  during prior echoes but was 40% during 10/2014 echo. b. Most recent 01/2015 EF normal, 55-60%.  . Chronic LBP    Surgical intervention in 1996  . Diabetes mellitus, type 2 (HCC)    Insulin therapy; exacerbated by prednisone  . Dysrhythmia    AFib  . Gastroparesis    99% retention 05/2008 on GES  . GERD (gastroesophageal reflux disease)   . Heart attack (HCC) 08/13/2020  . Hiatal hernia   . Hyperlipidemia   . Hypertension   . Hypothyroid   . IBS (irritable bowel syndrome)   . Obesity   . OSA on CPAP    had CPAP and cannot tolerate.  . Paroxysmal atrial fibrillation (HCC)   . Pulmonary hypertension (HCC) 01/2015   a. Predominantly pulmonary venous hypertension but may be component of PAH.  . Seizures (HCC)    last seizure was 2 years ago; on keppra for this; unknown etiology  . Syncope    a. Admitted 05/2009; magnetic resonance imagin/ MRA - negative; etiology thought to be orthostasis secondary to drugs and dehydration. b. Syncope 02/2015 also felt 2/2 dehydration.    Patient Active Problem List   Diagnosis Date Noted  . Altered mental status 11/21/2020  . Acute metabolic encephalopathy 08/30/2020  . Acute ischemic stroke (HCC)   . Respiratory failure with hypoxia (HCC) 08/24/2020  . Lobar pneumonia (HCC) 08/24/2020  . Septic shock (HCC) 08/24/2020  . NSTEMI (non-ST elevated myocardial infarction) (HCC) 08/17/2020  .  Constipation 03/17/2020  . Knee pain 10/07/2019  . Fibromyalgia 10/07/2019  . Polyarthropathy 10/07/2019  . Fibromyositis 09/30/2019  . Abnormal gait due to muscle weakness 09/30/2019  . Pain in right knee 09/30/2019  . Depression 08/31/2018  . Anxiety 08/31/2018  . Chronic respiratory failure with hypoxia and hypercapnia (HCC) 12/13/2017  . Acute systolic HF (heart failure) (HCC) 11/26/2017  . Palliative care by specialist   . Goals of care, counseling/discussion   . Acute on chronic diastolic CHF (congestive heart failure) (HCC) 08/17/2017  . Acute  respiratory failure with hypoxia and hypercapnia (HCC) 08/16/2017  . Uncontrolled type 2 diabetes mellitus with hyperglycemia (HCC) 08/16/2017  . Acute respiratory failure (HCC)   . CAP (community acquired pneumonia) 08/05/2017  . COPD exacerbation (HCC) 08/05/2017  . Pulmonary hypertension (HCC) 04/05/2017  . Derangement of posterior horn of medial meniscus of right knee   . Lateral meniscus, anterior horn derangement, right   . Syncope, near 12/26/2015  . Fall at home 12/26/2015  . Dysphagia 12/07/2015  . IDA (iron deficiency anemia) 03/13/2015  . Chronic diastolic CHF (congestive heart failure) (HCC) 02/20/2015  . Diffuse abdominal pain 09/16/2014  . Ascites 08/27/2014  . Anemia 07/20/2014  . Epistaxis, recurrent 07/20/2014  . Seizure (HCC) 04/11/2014  . Dementia (HCC) 04/11/2014  . Chronic diarrhea 04/11/2014  . DM type 2 (diabetes mellitus, type 2) (HCC) 04/11/2014  . Protein-calorie malnutrition, severe (HCC) 04/11/2014  . Abnormal weight loss 08/26/2013  . Lower abdominal pain 08/26/2013  . Anorexia nervosa 08/26/2013  . Encounter for therapeutic drug monitoring 08/26/2013  . DOE (dyspnea on exertion) 06/29/2013  . Tremor 10/27/2012  . Chronic anticoagulation 06/23/2011  . HYPOTENSION, ORTHOSTATIC 08/26/2009  . Hyperlipidemia 03/04/2009  . Barrett's esophagus 09/26/2008  . Gastroparesis 09/26/2008  . Iron deficiency anemia 09/25/2008  . Essential hypertension 09/25/2008  . LUNG NODULE 09/25/2008  . Irritable bowel syndrome 09/25/2008  . Sleep apnea 09/25/2008  . CARPAL TUNNEL SYNDROME, HX OF 09/25/2008  . Hypothyroidism 04/10/2008  . Body mass index (BMI) 40.0-44.9, adult (HCC) 04/10/2008  . Atrial fibrillation (HCC) 04/10/2008  . Congestive heart failure (HCC) 04/10/2008  . Low back pain 04/10/2008  . CHEST PAIN-PRECORDIAL 04/10/2008  . Obesity 04/10/2008    Past Surgical History:  Procedure Laterality Date  . BACK SURGERY  1996  . BIOPSY N/A 11/08/2013    Procedure: BIOPSY  / Tissue sampling / ulcers present in small intestine;  Surgeon: West Bali, MD;  Location: [facility] ENDO SUITE;  Service: Endoscopy;  Laterality: N/A;  . CARDIAC CATHETERIZATION  2002  . CARDIAC CATHETERIZATION N/A 01/26/2015   Procedure: Right Heart Cath;  Surgeon: Laurey Morale, MD;  Location: [facility] INVASIVE CV LAB;  Service: Cardiovascular;  Laterality: N/A;  . CARDIOVASCULAR STRESS TEST  2008   Stress nuclear study  . CARDIOVERSION N/A 03/06/2015   Procedure: CARDIOVERSION;  Surgeon: Laurey Morale, MD;  Location: [facility] ENDOSCOPY;  Service: Cardiovascular;  Laterality: N/A;  . CARPAL TUNNEL RELEASE  1994  . COLONOSCOPY  11/2011   Dr. Darrick Penna: Internal hemorrhoids, mild diverticulosis. Random colon biopsies negative.  . COLONOSCOPY N/A 11/08/2013   SLF: Normal mucosa in the terminal ileum/The colon IS redundant/  Moderate diverticulosis throughout the entire colon. ileum bx benign. colon bx benign  . ESOPHAGOGASTRODUODENOSCOPY  2008   Barrett's without dysplasia. esphagus dilated. antral erosions, h.pylori serologies negative.  . ESOPHAGOGASTRODUODENOSCOPY  11/2011   Dr. Darrick Penna: Barrett's esophagus, mild gastritis, diverticulum in the second portion of the duodenum repeat EGD  3 years. Small bowel biopsies negative. Gastric biopsy show reactive gastropathy but no H. pylori. Esophageal biopsies consistent with GERD. Next EGD 11/2014  . ESOPHAGOGASTRODUODENOSCOPY N/A 11/21/2014   Q[facility]:NOIB non-erosive gastritis/irregular z-line  . GIVENS CAPSULE STUDY  12/07/2011   Proximal small bowel, rare AVM. Distal small bowel, multiple ulcers noted  . GIVENS CAPSULE STUDY N/A 09/27/2013   Distal small bowel ulcers extending to TI.  Marland Kitchen GIVENS CAPSULE STUDY N/A 10/10/2013   Procedure: GIVENS CAPSULE STUDY;  Surgeon: West Bali, MD;  Location: [facility] ENDO SUITE;  Service: Endoscopy;  Laterality: N/A;  7:30  . IR KYPHO THORACIC WITH BONE BIOPSY  02/09/2018  . KNEE ARTHROSCOPY WITH MEDIAL MENISECTOMY Right  06/09/2016   Procedure: KNEE ARTHROSCOPY WITH MEDIAL MENISECTOMY;  Surgeon: Vickki Hearing, MD;  Location: [facility] ORS;  Service: Orthopedics;  Laterality: Right;  medial and lateral menisectomy  . LAMINECTOMY  1995   L4-L5  . LAPAROSCOPIC CHOLECYSTECTOMY  1990s  . LEFT HEART CATHETERIZATION WITH CORONARY ANGIOGRAM  01/10/2014   Procedure: LEFT HEART CATHETERIZATION WITH CORONARY ANGIOGRAM;  Surgeon: Lesleigh Noe, MD;  Location: [facility] CATH LAB;  Service: Cardiovascular;;  . RIGHT HEART CATHETERIZATION N/A 01/10/2014   Procedure: RIGHT HEART CATH;  Surgeon: Lesleigh Noe, MD;  Location: [facility] CATH LAB;  Service: Cardiovascular;  Laterality: N/A;  . TOTAL ABDOMINAL HYSTERECTOMY  1999  . TRACHEOSTOMY TUBE PLACEMENT N/A 09/01/2017   Procedure: TRACHEOSTOMY;  Surgeon: Drema Halon, MD;  Location: [facility] OR;  Service: ENT;  Laterality: N/A;     OB History    Gravida  2   Para  2   Term  2   Preterm      AB      Living        SAB      IAB      Ectopic      Multiple      Live Births              Family History  Problem Relation Age of Onset  . Hypertension Mother   . Alzheimer's disease Mother   . Stroke Mother   . Heart attack Mother   . Hypertension Other   . Breast cancer Sister   . Heart disease Neg Hx   . Colon cancer Neg Hx     Social History   Tobacco Use  . Smoking status: Former Smoker    Packs/day: 0.25    Years: 15.00    Pack years: 3.75    Types: Cigarettes    Start date: 02/26/1966    Quit date: 07/01/1983    Years since quitting: 37.4  . Smokeless tobacco: Never Used  . Tobacco comment: quit in 1984  Vaping Use  . Vaping Use: Never used  Substance Use Topics  . Alcohol use: No    Alcohol/week: 0.0 standard drinks  . Drug use: No    Home Medications Prior to Admission medications   Medication Sig Start Date End Date Taking? Authorizing Provider  acetaminophen (TYLENOL) 500 MG tablet Take 500 mg by mouth as needed for moderate pain  or headache.     [provider]  amiodarone (PACERONE) 200 MG tablet TAKE 1 TABLET BY MOUTH EVERY MORNING 03/03/20   Strader, [place] M, PA-C  apixaban (ELIQUIS) 5 MG TABS tablet Take 1 tablet (5 mg total) by mouth 2 (two) times daily. 02/14/20   Antoine Poche, MD  atorvastatin (LIPITOR) 80 MG tablet Place 1  tablet (80 mg total) into feeding tube daily. 09/22/20   Russella Dar, NP  cephALEXin (KEFLEX) 500 MG capsule Take 1 capsule (500 mg total) by mouth 2 (two) times daily. 11/21/20   [name], PA-C  Cholecalciferol (VITAMIN D3) 2000 units capsule Take 2,000 Units by mouth daily. 10/25/17   [provider]  donepezil (ARICEPT) 5 MG tablet Take 5 mg by mouth daily.    [provider]  DULoxetine (CYMBALTA) 30 MG capsule Take 30 mg by mouth 2 (two) times daily. 11/24/17   [provider]  feeding supplement (ENSURE ENLIVE / ENSURE PLUS) LIQD Take 237 mLs by mouth 3 (three) times daily between meals. 09/21/20   Russella Dar, NP  furosemide (LASIX) 20 MG tablet Take 1 tablet (20 mg total) by mouth daily. 09/22/20   Russella Dar, NP  gabapentin (NEURONTIN) 100 MG capsule Take 1 capsule (100 mg total) by mouth 3 (three) times daily. 09/21/20   Russella Dar, NP  insulin glargine (LANTUS) 100 UNIT/ML injection Inject 0.1 mLs (10 Units total) into the skin daily. 09/21/20   Russella Dar, NP  Lactase 9000 units CHEW 1 PO Q4H WHEN YOU EAT ICE CREAM OR CHEESE Patient taking differently: No sig reported 09/05/19   West Bali, MD  levETIRAcetam (KEPPRA) 500 MG tablet Take 2 tablets (1,000 mg total) by mouth 2 (two) times daily. 09/21/20   Russella Dar, NP  levothyroxine (SYNTHROID) 175 MCG tablet Take 1 tablet by mouth daily. 05/20/19   [provider]  loperamide (IMODIUM) 2 MG capsule 1 PO Q4H PRN LOOSE STOOLS Patient taking differently: Take 4 mg by mouth every 4 (four) hours as needed. 1 PO Q4H PRN LOOSE STOOLS 09/05/19   Fields,  Sandi L, MD  LORazepam (ATIVAN) 0.5 MG tablet Take 0.5 mg by mouth every 8 (eight) hours.    [provider]  magnesium oxide (MAG-OX) 400 MG tablet Take 1 tablet (400 mg total) by mouth 2 (two) times daily. Patient not taking: Reported on 10/25/2020 12/26/17   Antoine Poche, MD  meclizine (ANTIVERT) 25 MG tablet Take 1 tablet (25 mg total) by mouth every 8 (eight) hours as needed for dizziness. 10/16/20   Antoine Poche, MD  Multiple Vitamin (MULTIVITAMIN WITH MINERALS) TABS tablet Take 1 tablet by mouth daily.    [provider]  ondansetron (ZOFRAN) 4 MG tablet TAKE 1 TABLET BY MOUTH FOUR TIMES DAILY AS NEEDED FOR NAUSEA Patient taking differently: No sig reported 01/10/18   Gelene Mink, NP  OXYGEN 2 L daily.     [provider]  pantoprazole (PROTONIX) 40 MG tablet TAKE ONE TABLET BY MOUTH TWICE DAILY BEFORE APPLY MEAL Patient taking differently: Take 40 mg by mouth 2 (two) times daily. 02/07/17   Gelene Mink, NP  potassium chloride (KLOR-CON) 10 MEQ tablet Take 20 mEq by mouth daily.    [provider]  SM MELATONIN 3 MG TABS tablet Take 3 mg by mouth at bedtime as needed for sleep. 08/22/20   [provider]    Allergies    Citalopram hydrobromide, Codeine, Hydromorphone hcl, Metoclopramide, and Hyoscyamine  Review of Systems   Review of Systems  Constitutional: Negative for chills and fever.  HENT: Negative for congestion.   Eyes: Negative.  Negative for visual disturbance.  Respiratory: Negative for chest tightness and shortness of breath.   Cardiovascular: Negative for chest pain.  Gastrointestinal: Negative for abdominal pain, nausea and  vomiting.  Genitourinary: Negative.  Negative for dysuria.  Musculoskeletal: Positive for gait problem. Negative for arthralgias, joint swelling and neck pain.  Skin: Negative.  Negative for rash and wound.  Neurological: Positive for dizziness and speech difficulty. Negative for weakness,  light-headedness, numbness and headaches.  Psychiatric/Behavioral: Positive for confusion and hallucinations.    Physical Exam Updated Vital Signs BP (!) 142/80   Pulse (!) 108   Temp 98 F (36.7 C) (Oral)   Resp (!) 25   Wt 100.7 kg   SpO2 93%   BMI 35.83 kg/m   Physical Exam Vitals and nursing note reviewed.  Constitutional:      General: She is not in acute distress.    Appearance: She is well-developed.  HENT:     Head: Normocephalic and atraumatic.     Mouth/Throat:     Mouth: Mucous membranes are moist.  Eyes:     Extraocular Movements: Extraocular movements intact.     Conjunctiva/sclera: Conjunctivae normal.     Pupils: Pupils are equal, round, and reactive to light.  Cardiovascular:     Rate and Rhythm: Normal rate and regular rhythm.     Heart sounds: Normal heart sounds.  Pulmonary:     Effort: Pulmonary effort is normal.     Breath sounds: Normal breath sounds. No wheezing.  Abdominal:     General: Bowel sounds are normal.     Palpations: Abdomen is soft.     Tenderness: There is no abdominal tenderness.  Musculoskeletal:        General: Normal range of motion.     Cervical back: Normal range of motion.  Skin:    General: Skin is warm and dry.  Neurological:     Mental Status: She is alert and oriented to person, place, and time.     Cranial Nerves: Dysarthria present.     Comments: Pt can speak clearly initially, but with worsening slurring of words as the sentence progresses.  No pronator drift, equal grip strength, 4/5 bilaterally.  Moves legs without deficit, unable to follow directions for heel shin testing.       ED Results / Procedures / Treatments   Labs (all labs ordered are listed, but only abnormal results are displayed) Labs Reviewed  COMPREHENSIVE METABOLIC PANEL - Abnormal; Notable for the following components:      Result Value   CO2 33 (*)    Glucose, Bld 180 (*)    Total Protein 6.3 (*)    Albumin 3.3 (*)    AST 14 (*)    All  other components within normal limits  CBC WITH DIFFERENTIAL/PLATELET - Abnormal; Notable for the following components:   RBC 3.16 (*)    Hemoglobin 9.6 (*)    HCT 32.5 (*)    MCV 102.8 (*)    MCHC 29.5 (*)    All other components within normal limits  URINALYSIS, ROUTINE W REFLEX MICROSCOPIC - Abnormal; Notable for the following components:   Color, Urine AMBER (*)    APPearance HAZY (*)    Protein, ur 30 (*)    All other components within normal limits  CBG MONITORING, ED - Abnormal; Notable for the following components:   Glucose-Capillary 180 (*)    All other components within normal limits  SARS CORONAVIRUS 2 (TAT 6-24 HRS)    EKG EKG Interpretation  Date/Time:  Saturday Nov 21 2020 15:53:02 EDT Ventricular Rate:  102 PR Interval:    QRS Duration: 113 QT Interval:  380 QTC  Calculation: 495 R Axis:   -44 Text Interpretation: Atrial fibrillation Borderline IVCD with LAD Nonspecific repol abnormality, diffuse leads Borderline prolonged QT interval Baseline wander in lead(s) I II aVR V1 V2 V6 Confirmed by Bethann BerkshireZammit, Joseph [phone]([id number]) on 11/21/2020 6:46:45 PM   Radiology CT Head Wo Contrast  Result Date: 11/21/2020 CLINICAL DATA:  70 year old female with altered mental status. EXAM: CT HEAD WITHOUT CONTRAST TECHNIQUE: Contiguous axial images were obtained from the base of the skull through the vertex without intravenous contrast. COMPARISON:  10/25/2020 CT and prior studies FINDINGS: Brain: No evidence of acute infarction, hemorrhage, hydrocephalus, extra-axial collection or mass lesion/mass effect. Mild atrophy and chronic small-vessel white matter ischemic changes noted. Vascular: Carotid atherosclerotic calcifications are noted. Skull: Normal. Negative for fracture or focal lesion. Sinuses/Orbits: No acute finding. Other: None. IMPRESSION: 1. No evidence of acute intracranial abnormality. 2. Mild atrophy and chronic small-vessel white matter ischemic changes. Electronically Signed    By: Harmon PierJeffrey  Hu M.D.   On: 11/21/2020 17:28   DG Chest Port 1 View  Result Date: 11/21/2020 CLINICAL DATA:  Altered mental status EXAM: PORTABLE CHEST 1 VIEW COMPARISON:  10/25/2020 FINDINGS: Chronic cardiomegaly. Atherosclerotic calcification of the aortic knob. Mild pulmonary vascular congestion and bilateral interstitial prominence. Bandlike opacities within the perihilar and bibasilar regions. No new focal airspace consolidation. Possible trace left pleural effusion. No pneumothorax. Bones are demineralized. IMPRESSION: 1. Findings suggestive CHF with mild edema. 2. Bandlike opacities within the perihilar and bibasilar regions, likely atelectasis. Electronically Signed   By: Duanne GuessNicholas  Plundo D.O.   On: 11/21/2020 17:09    Procedures Procedures   Medications Ordered in ED Medications  acetaminophen (TYLENOL) tablet 650 mg (650 mg Oral Given 11/21/20 1808)    ED Course  I have reviewed the triage vital signs and the nursing notes.  Pertinent labs & imaging results that were available during my care of the patient were reviewed by me and considered in my medical decision making (see chart for details).    MDM Rules/Calculators/A&P                          Patient's history discussed with Dr. Otelia LimesLindzen of neurology who suspects patient's symptoms may be medication related, specifically Namenda can cause worsening dementia and is not typically prescribed for improved dementia but for control of behavioral problems associated with Alzheimer's dementia.  Symptoms do not fit pattern suggesting a new stroke.  He recommended observation with discontinuing benzodiazepines, any opiates, cholinergics, and her Namenda, would recommend an MRI on Monday if discontinuing these medications do not clear her to her baseline mentation.  Discussed with Dr. Thomes DinningAdefeso of the hospitalist service who accepts patient for admission. Final Clinical Impression(s) / ED Diagnoses Final diagnoses:  Altered mental status,  unspecified altered mental status type    Rx / DC Orders ED Discharge Orders    None       Victoriano Laindol, Marena Witts, PA-C 11/21/20 2027    Bethann BerkshireZammit, Joseph, MD 11/21/20 2311

## 2020-11-21 NOTE — ED Notes (Signed)
Patient requesting tylenol for back pain in which daughter states is what she normal takes for chronic back pain. EDPa made aware, verbal order given.

## 2020-11-22 ENCOUNTER — Inpatient Hospital Stay (HOSPITAL_COMMUNITY): Payer: PPO

## 2020-11-22 ENCOUNTER — Observation Stay (HOSPITAL_COMMUNITY): Payer: PPO

## 2020-11-22 DIAGNOSIS — Z20822 Contact with and (suspected) exposure to covid-19: Secondary | ICD-10-CM | POA: Diagnosis not present

## 2020-11-22 DIAGNOSIS — E662 Morbid (severe) obesity with alveolar hypoventilation: Secondary | ICD-10-CM | POA: Diagnosis not present

## 2020-11-22 DIAGNOSIS — J9621 Acute and chronic respiratory failure with hypoxia: Secondary | ICD-10-CM | POA: Diagnosis present

## 2020-11-22 DIAGNOSIS — I5021 Acute systolic (congestive) heart failure: Secondary | ICD-10-CM | POA: Diagnosis not present

## 2020-11-22 DIAGNOSIS — R443 Hallucinations, unspecified: Secondary | ICD-10-CM | POA: Diagnosis not present

## 2020-11-22 DIAGNOSIS — I5033 Acute on chronic diastolic (congestive) heart failure: Secondary | ICD-10-CM | POA: Diagnosis present

## 2020-11-22 DIAGNOSIS — Z7901 Long term (current) use of anticoagulants: Secondary | ICD-10-CM | POA: Diagnosis not present

## 2020-11-22 DIAGNOSIS — D631 Anemia in chronic kidney disease: Secondary | ICD-10-CM | POA: Diagnosis present

## 2020-11-22 DIAGNOSIS — E1122 Type 2 diabetes mellitus with diabetic chronic kidney disease: Secondary | ICD-10-CM | POA: Diagnosis present

## 2020-11-22 DIAGNOSIS — E785 Hyperlipidemia, unspecified: Secondary | ICD-10-CM | POA: Diagnosis present

## 2020-11-22 DIAGNOSIS — G9341 Metabolic encephalopathy: Secondary | ICD-10-CM

## 2020-11-22 DIAGNOSIS — I130 Hypertensive heart and chronic kidney disease with heart failure and stage 1 through stage 4 chronic kidney disease, or unspecified chronic kidney disease: Secondary | ICD-10-CM | POA: Diagnosis not present

## 2020-11-22 DIAGNOSIS — R4182 Altered mental status, unspecified: Secondary | ICD-10-CM | POA: Diagnosis present

## 2020-11-22 DIAGNOSIS — Z8673 Personal history of transient ischemic attack (TIA), and cerebral infarction without residual deficits: Secondary | ICD-10-CM | POA: Diagnosis not present

## 2020-11-22 DIAGNOSIS — I252 Old myocardial infarction: Secondary | ICD-10-CM | POA: Diagnosis not present

## 2020-11-22 DIAGNOSIS — G319 Degenerative disease of nervous system, unspecified: Secondary | ICD-10-CM | POA: Diagnosis not present

## 2020-11-22 DIAGNOSIS — Z6836 Body mass index (BMI) 36.0-36.9, adult: Secondary | ICD-10-CM | POA: Diagnosis not present

## 2020-11-22 DIAGNOSIS — J9811 Atelectasis: Secondary | ICD-10-CM | POA: Diagnosis not present

## 2020-11-22 DIAGNOSIS — R569 Unspecified convulsions: Secondary | ICD-10-CM | POA: Diagnosis not present

## 2020-11-22 DIAGNOSIS — E1165 Type 2 diabetes mellitus with hyperglycemia: Secondary | ICD-10-CM | POA: Diagnosis present

## 2020-11-22 DIAGNOSIS — G40909 Epilepsy, unspecified, not intractable, without status epilepticus: Secondary | ICD-10-CM | POA: Diagnosis present

## 2020-11-22 DIAGNOSIS — I517 Cardiomegaly: Secondary | ICD-10-CM | POA: Diagnosis not present

## 2020-11-22 DIAGNOSIS — N1831 Chronic kidney disease, stage 3a: Secondary | ICD-10-CM | POA: Diagnosis not present

## 2020-11-22 DIAGNOSIS — I482 Chronic atrial fibrillation, unspecified: Secondary | ICD-10-CM | POA: Diagnosis not present

## 2020-11-22 DIAGNOSIS — E039 Hypothyroidism, unspecified: Secondary | ICD-10-CM | POA: Diagnosis present

## 2020-11-22 DIAGNOSIS — E441 Mild protein-calorie malnutrition: Secondary | ICD-10-CM | POA: Diagnosis not present

## 2020-11-22 DIAGNOSIS — I2510 Atherosclerotic heart disease of native coronary artery without angina pectoris: Secondary | ICD-10-CM | POA: Diagnosis present

## 2020-11-22 DIAGNOSIS — E1143 Type 2 diabetes mellitus with diabetic autonomic (poly)neuropathy: Secondary | ICD-10-CM | POA: Diagnosis present

## 2020-11-22 DIAGNOSIS — R918 Other nonspecific abnormal finding of lung field: Secondary | ICD-10-CM | POA: Diagnosis not present

## 2020-11-22 DIAGNOSIS — F0390 Unspecified dementia without behavioral disturbance: Secondary | ICD-10-CM | POA: Diagnosis present

## 2020-11-22 LAB — FOLATE: Folate: 25.7 ng/mL (ref >5.9)

## 2020-11-22 LAB — ECHOCARDIOGRAM LIMITED
Area-P 1/2: 5.07 cm2
Calc EF: 51.3 %
S' Lateral: 3.99 cm
Single Plane A2C EF: 46.7 %
Single Plane A4C EF: 55.9 %
Weight: 3640.24 oz

## 2020-11-22 LAB — CBC
HCT: 31.2 % — ABNORMAL LOW (ref 36.0–46.0)
Hemoglobin: 9.2 g/dL — ABNORMAL LOW (ref 12.0–15.0)
MCH: 29.5 pg (ref 26.0–34.0)
MCHC: 29.5 g/dL — ABNORMAL LOW (ref 30.0–36.0)
MCV: 100 fL (ref 80.0–100.0)
Platelets: 201 10*3/uL (ref 150–400)
RBC: 3.12 MIL/uL — ABNORMAL LOW (ref 3.87–5.11)
RDW: 15.4 % (ref 11.5–15.5)
WBC: 6.5 10*3/uL (ref 4.0–10.5)
nRBC: 0 % (ref 0.0–0.2)

## 2020-11-22 LAB — PROCALCITONIN: Procalcitonin: <0.1 ng/mL

## 2020-11-22 LAB — GLUCOSE, CAPILLARY
Glucose-Capillary: 112 mg/dL — ABNORMAL HIGH (ref 70–99)
Glucose-Capillary: 147 mg/dL — ABNORMAL HIGH (ref 70–99)
Glucose-Capillary: 185 mg/dL — ABNORMAL HIGH (ref 70–99)
Glucose-Capillary: 208 mg/dL — ABNORMAL HIGH (ref 70–99)
Glucose-Capillary: 210 mg/dL — ABNORMAL HIGH (ref 70–99)

## 2020-11-22 LAB — COMPREHENSIVE METABOLIC PANEL
ALT: 13 U/L (ref 0–44)
AST: 16 U/L (ref 15–41)
Albumin: 3.1 g/dL — ABNORMAL LOW (ref 3.5–5.0)
Alkaline Phosphatase: 56 U/L (ref 38–126)
Anion gap: 10 (ref 5–15)
BUN: 12 mg/dL (ref 8–23)
CO2: 32 mmol/L (ref 22–32)
Calcium: 9.1 mg/dL (ref 8.9–10.3)
Chloride: 97 mmol/L — ABNORMAL LOW (ref 98–111)
Creatinine, Ser: 0.73 mg/dL (ref 0.44–1.00)
GFR, Estimated: >60 mL/min (ref >60)
Glucose, Bld: 168 mg/dL — ABNORMAL HIGH (ref 70–99)
Potassium: 4 mmol/L (ref 3.5–5.1)
Sodium: 139 mmol/L (ref 135–145)
Total Bilirubin: 0.7 mg/dL (ref 0.3–1.2)
Total Protein: 6.3 g/dL — ABNORMAL LOW (ref 6.5–8.1)

## 2020-11-22 LAB — TSH: TSH: 0.526 u[IU]/mL (ref 0.350–4.500)

## 2020-11-22 LAB — APTT: aPTT: 37 seconds — ABNORMAL HIGH (ref 24–36)

## 2020-11-22 LAB — PROTIME-INR
INR: 1.5 — ABNORMAL HIGH (ref 0.8–1.2)
Prothrombin Time: 18 seconds — ABNORMAL HIGH (ref 11.4–15.2)

## 2020-11-22 LAB — VITAMIN B12: Vitamin B-12: 5145 pg/mL — ABNORMAL HIGH (ref 180–914)

## 2020-11-22 LAB — HEMOGLOBIN A1C
Hgb A1c MFr Bld: 7.6 % — ABNORMAL HIGH (ref 4.8–5.6)
Mean Plasma Glucose: 171.42 mg/dL

## 2020-11-22 LAB — MAGNESIUM: Magnesium: 1.6 mg/dL — ABNORMAL LOW (ref 1.7–2.4)

## 2020-11-22 LAB — PHOSPHORUS: Phosphorus: 4 mg/dL (ref 2.5–4.6)

## 2020-11-22 LAB — SARS CORONAVIRUS 2 (TAT 6-24 HRS): SARS Coronavirus 2: NEGATIVE

## 2020-11-22 MED ORDER — INSULIN ASPART 100 UNIT/ML IJ SOLN: 0.0000 [IU] | Freq: Every day | INTRAMUSCULAR | Status: DC

## 2020-11-22 MED ORDER — ACETAMINOPHEN 325 MG PO TABS
650.0000 mg | ORAL_TABLET | Freq: Four times a day (QID) | ORAL | Status: DC | PRN
  Administered 2020-11-22 – 2020-11-24 (×7): 650 mg via ORAL
  Filled 2020-11-22 (×7): qty 2

## 2020-11-22 MED ORDER — LEVETIRACETAM 500 MG PO TABS
1000.0000 mg | ORAL_TABLET | Freq: Two times a day (BID) | ORAL | Status: DC
  Administered 2020-11-22 – 2020-11-24 (×6): 1000 mg via ORAL
  Filled 2020-11-22 (×6): qty 2

## 2020-11-22 MED ORDER — GLUCERNA SHAKE PO LIQD
237.0000 mL | Freq: Three times a day (TID) | ORAL | Status: DC
  Administered 2020-11-22 – 2020-11-24 (×5): 237 mL via ORAL

## 2020-11-22 MED ORDER — LEVOTHYROXINE SODIUM 75 MCG PO TABS
175.0000 ug | ORAL_TABLET | Freq: Every day | ORAL | Status: DC
  Administered 2020-11-22 – 2020-11-24 (×3): 175 ug via ORAL
  Filled 2020-11-22 (×3): qty 1

## 2020-11-22 MED ORDER — APIXABAN 5 MG PO TABS
5.0000 mg | ORAL_TABLET | Freq: Two times a day (BID) | ORAL | Status: DC
  Administered 2020-11-22 – 2020-11-24 (×6): 5 mg via ORAL
  Filled 2020-11-22 (×6): qty 1

## 2020-11-22 MED ORDER — PANTOPRAZOLE SODIUM 40 MG PO TBEC
40.0000 mg | DELAYED_RELEASE_TABLET | Freq: Two times a day (BID) | ORAL | Status: DC
  Administered 2020-11-22 – 2020-11-24 (×5): 40 mg via ORAL
  Filled 2020-11-22 (×5): qty 1

## 2020-11-22 MED ORDER — INSULIN ASPART 100 UNIT/ML IJ SOLN
0.0000 [IU] | Freq: Three times a day (TID) | INTRAMUSCULAR | Status: DC
  Administered 2020-11-22: 3 [IU] via SUBCUTANEOUS
  Administered 2020-11-22 (×2): 5 [IU] via SUBCUTANEOUS
  Administered 2020-11-23: 2 [IU] via SUBCUTANEOUS
  Administered 2020-11-23: 5 [IU] via SUBCUTANEOUS
  Administered 2020-11-23 – 2020-11-24 (×3): 3 [IU] via SUBCUTANEOUS

## 2020-11-22 MED ORDER — FUROSEMIDE 10 MG/ML IJ SOLN
40.0000 mg | Freq: Every day | INTRAMUSCULAR | Status: DC
  Administered 2020-11-22 – 2020-11-24 (×3): 40 mg via INTRAVENOUS
  Filled 2020-11-22 (×3): qty 4

## 2020-11-22 MED ORDER — MELATONIN 3 MG PO TABS
6.0000 mg | ORAL_TABLET | Freq: Every day | ORAL | Status: DC
  Administered 2020-11-22 – 2020-11-23 (×2): 6 mg via ORAL
  Filled 2020-11-22 (×2): qty 2

## 2020-11-22 MED ORDER — AMIODARONE HCL 200 MG PO TABS
200.0000 mg | ORAL_TABLET | Freq: Every morning | ORAL | Status: DC
  Administered 2020-11-22 – 2020-11-24 (×3): 200 mg via ORAL
  Filled 2020-11-22 (×3): qty 1

## 2020-11-22 MED ORDER — MAGNESIUM SULFATE 2 GM/50ML IV SOLN
2.0000 g | Freq: Once | INTRAVENOUS | Status: AC
  Administered 2020-11-22: 2 g via INTRAVENOUS
  Filled 2020-11-22: qty 50

## 2020-11-22 MED ORDER — ONDANSETRON HCL 4 MG/2ML IJ SOLN
4.0000 mg | Freq: Four times a day (QID) | INTRAMUSCULAR | Status: DC | PRN
  Administered 2020-11-22 – 2020-11-23 (×2): 4 mg via INTRAVENOUS
  Filled 2020-11-22 (×2): qty 2

## 2020-11-22 MED ORDER — ATORVASTATIN CALCIUM 40 MG PO TABS
80.0000 mg | ORAL_TABLET | Freq: Every day | ORAL | Status: DC
  Administered 2020-11-22 – 2020-11-24 (×3): 80 mg
  Filled 2020-11-22 (×3): qty 2

## 2020-11-22 NOTE — Progress Notes (Signed)
MD made aware of patients Mg 1.6. 2 grams ordered, pt in room hallucinating stating that there was monsters in the bathroom. Pt redirected at this time. Daughter at bedside.

## 2020-11-22 NOTE — Progress Notes (Signed)
Patients EKG has been completed early at 0100. This was accomplished so Patient would not have to be awakened at 0500 am.

## 2020-11-22 NOTE — Progress Notes (Signed)
  Echocardiogram 2D Echocardiogram has been performed.  [name] 11/22/2020, 1:50 PM

## 2020-11-22 NOTE — Progress Notes (Signed)
PROGRESS NOTE  [name] OZH:[id number] DOB: 21-Mar-1951 DOA: 11/21/2020 PCP: Gareth Morgan, MD  Brief History:  70 year old female with a history of diastolic CHF, chronic atrial fibrillation, pulmonary hypertension, hypertension, hyperlipidemia, diabetes mellitus type 2, coronary artery disease with NSTEMI February 2022, CKD stage III, seizure disorder, chronic back pain, and dementia presenting with 2-day history of confusion and unsteady gait.  The patient is a poor historian secondary to her dementia.  History provided by the patient's daughter reveals that the patient has had some confusion and hallucinations that began on 11/18/2020.  Daughter has noted some gait instability.  Daughter states that she feels that the patient has had some occasional slurred speech.  There is been no reports of fevers, chills, chest pain, worsening shortness of breath, headache, nausea, vomiting, diarrhea, abdominal pain, hematochezia, melena. The patient had a recent prolonged hospitalization from 08/23/2020 to 09/21/2020.  During that hospitalization, the patient was treated for sepsis secondary to pneumonia.  She required intubation at the time of admission, but was subsequently extubated on 08/26/2020.  She required reintubation on 08/30/2020 due to complete atelectasis of the left lung from mucous plugging.  She was transferred to [facility] on that date and was treated until the time of discharge.  Skilled nursing facility was refused by the patient's family.  The patient was taken home with home health physical therapy.  The patient's discharge weight at that time was 214 pounds. Daughter states the patient has been doing fairly well since discharge with improving functional mobility and independence.  The patient was using a walker to assist with ambulation. Daughter states that the patient was recently started on Namenda on 11/17/2020.  No other new medications. In the emergency department, the  patient was afebrile hemodynamically stable with oxygen saturation 94% on 4 L.  BMP showed a sodium 139 and serum creatinine 0.74.  WBC 6.0, globin 9.6, platelets 209,000.  Chest x-ray showed increasing vascular congestion.  UA was negative for pyuria.  Neurology was contacted by ED, and felt that the patient could stay at [facility] for further evaluation.   the patient was admitted for further evaluation and treatment.  Assessment/Plan: Acute on chronic respiratory failure with hypoxia -Chronically on 2 L nasal cannula at home -Currently stable on 3.5 L -Secondary to pulmonary edema -Daughter concerned about pneumonia, requesting CT chest -Wean oxygen back to baseline level -personally reviewed CXR--increased interstitial markings  Acute on chronic diastolic CHF -08/29/2020 echo EF 30 to 55%, no WMA, PASP 77.7 -Patient is on furosemide 20 mg p.o. daily at home -Continue IV furosemide -She remains clinically fluid overloaded -09/22/2020 discharge weight 214 -11/20/2020 home weight 221 -Daily weights -Accurate I's and O's  Acute metabolic encephalopathy -Multifactorial including decompensated CHF/hypoxia, possibly Namenda introduction, and progression of underlying dementia -Patient remains confused with intermittent hallucination -Urine negative for pyuria -Check TSH -B12 -Folic acid -Minimize hypnotic drugs  Chronic atrial fibrillation -Continue amiodarone -Continue apixaban  Hyperlipidemia -Continue statin  Dementia without behavioral disturbance -Continue Aricept -She is on as needed Ativan at home  Hypothyroidism -Continue Synthroid  Diabetes mellitus type 2 uncontrolled with hyperglycemia -NovoLog sliding scale -08/24/2020 hemoglobin A1c 7.9  CKD stage IIIa -Baseline creatinine 0.8-1.1  Coronary artery disease -Patient had NSTEMI February 2022 -She had transient suppression of her LVEF (15-20%) on echo 08/17/2020 -Continue apixaban -personally reviewed  EKG--afib, nonspecific STT change  History of stroke -Continue apixaban  Seizure disorder -She is on Keppra  1000 mg twice daily  Anemia of chronic disease -Baseline Hgb ~10 -Transfuse for hemoglobin less than 7 or if she has significant bleeding  OSA/OHS -Patient intolerant of CPAP  Morbid obesity -BMI 36.72 -Lifestyle modification  Chronic back pain -She was on as needed hydrocodone at home   Stage I sacral ulcer -Present on admission -Local wound care  Hypomagnesemia -replete    Status is: Observation  The patient will require care spanning > 2 midnights and should be moved to inpatient because: Inpatient level of care appropriate due to severity of illness  Dispo: The patient is from: Home              Anticipated d/c is to: Home              Patient currently is not medically stable to d/c.   Difficult to place patient No        Family Communication:   Daughter updated at bedside 5/22  Consultants:  none  Code Status:  FULL   DVT Prophylaxis:  apixaban   Procedures: As Listed in Progress Note Above  Antibiotics: None       Subjective: Patient denies fevers, chills, headache, chest pain, dyspnea, nausea, vomiting, diarrhea, abdominal pain,   Objective: Vitals:   11/21/20 2100 11/21/20 2230 11/21/20 2232 11/22/20 0230  BP: (!) 157/98 132/85  (!) 125/97  Pulse: 98 99  82  Resp: (!) 22 (!) 21  20  Temp:  98 F (36.7 C)  97.7 F (36.5 C)  TempSrc:  Oral  Oral  SpO2:  95% 95% 94%  Weight:  103.2 kg      Intake/Output Summary (Last 24 hours) at 11/22/2020 0751 Last data filed at 11/22/2020 2409 Gross per 24 hour  Intake 13.33 ml  Output 1525 ml  Net -1511.67 ml   Weight change:  Exam:   General:  Pt is alert, follows commands appropriately, not in acute distress  HEENT: No icterus, No thrush, No neck mass, [place]/AT  Cardiovascular: RRR, S1/S2, no rubs, no gallops  Respiratory: CT bibasilar crackles  Abdomen: Soft/+BS, non  tender, non distended, no guarding  Extremities: trace LE edema, No lymphangitis, No petechiae, No rashes, no synovitis   Data Reviewed: I have personally reviewed following labs and imaging studies Basic Metabolic Panel: Recent Labs  Lab 11/21/20 1656 11/22/20 0403  NA 139 139  K 4.7 4.0  CL 100 97*  CO2 33* 32  GLUCOSE 180* 168*  BUN 13 12  CREATININE 0.74 0.73  CALCIUM 9.1 9.1  MG  --  1.6*  PHOS  --  4.0   Liver Function Tests: Recent Labs  Lab 11/21/20 1656 11/22/20 0403  AST 14* 16  ALT 13 13  ALKPHOS 56 56  BILITOT 0.4 0.7  PROT 6.3* 6.3*  ALBUMIN 3.3* 3.1*   No results for input(s): LIPASE, AMYLASE in the last 168 hours. No results for input(s): AMMONIA in the last 168 hours. Coagulation Profile: Recent Labs  Lab 11/22/20 0403  INR 1.5*   CBC: Recent Labs  Lab 11/21/20 1656 11/22/20 0403  WBC 6.0 6.5  NEUTROABS 3.8  --   HGB 9.6* 9.2*  HCT 32.5* 31.2*  MCV 102.8* 100.0  PLT 209 201   Cardiac Enzymes: No results for input(s): CKTOTAL, CKMB, CKMBINDEX, TROPONINI in the last 168 hours. BNP: Invalid input(s): POCBNP CBG: Recent Labs  Lab 11/21/20 1656 11/22/20 0022 11/22/20 0737  GLUCAP 180* 147* 208*   HbA1C: No results for input(s): HGBA1C  in the last 72 hours. Urine analysis:    Component Value Date/Time   COLORURINE AMBER (A) 11/21/2020 1820   APPEARANCEUR HAZY (A) 11/21/2020 1820   LABSPEC 1.025 11/21/2020 1820   PHURINE 5.0 11/21/2020 1820   GLUCOSEU NEGATIVE 11/21/2020 1820   HGBUR NEGATIVE 11/21/2020 1820   BILIRUBINUR NEGATIVE 11/21/2020 1820   KETONESUR NEGATIVE 11/21/2020 1820   PROTEINUR 30 (A) 11/21/2020 1820   UROBILINOGEN 0.2 02/05/2015 1700   NITRITE NEGATIVE 11/21/2020 1820   LEUKOCYTESUR NEGATIVE 11/21/2020 1820   Sepsis Labs: @LABRCNTIP (procalcitonin:4,lacticidven:4) )No results found for this or any previous visit (from the past 240 hour(s)).   Scheduled Meds: . amiodarone  200 mg Oral q morning  .  apixaban  5 mg Oral BID  . atorvastatin  80 mg Per Tube Daily  . feeding supplement (GLUCERNA SHAKE)  237 mL Oral TID BM  . insulin aspart  0-15 Units Subcutaneous TID WC  . insulin aspart  0-5 Units Subcutaneous QHS  . levETIRAcetam  1,000 mg Oral BID  . levothyroxine  175 mcg Oral Q0600  . pantoprazole  40 mg Oral BID   Continuous Infusions:  Procedures/Studies: CT Head Wo Contrast  Result Date: 11/21/2020 CLINICAL DATA:  70 year old female with altered mental status. EXAM: CT HEAD WITHOUT CONTRAST TECHNIQUE: Contiguous axial images were obtained from the base of the skull through the vertex without intravenous contrast. COMPARISON:  10/25/2020 CT and prior studies FINDINGS: Brain: No evidence of acute infarction, hemorrhage, hydrocephalus, extra-axial collection or mass lesion/mass effect. Mild atrophy and chronic small-vessel white matter ischemic changes noted. Vascular: Carotid atherosclerotic calcifications are noted. Skull: Normal. Negative for fracture or focal lesion. Sinuses/Orbits: No acute finding. Other: None. IMPRESSION: 1. No evidence of acute intracranial abnormality. 2. Mild atrophy and chronic small-vessel white matter ischemic changes. Electronically Signed   By: Harmon Pier M.D.   On: 11/21/2020 17:28   CT Head Wo Contrast  Result Date: 10/25/2020 CLINICAL DATA:  Altered mental status EXAM: CT HEAD WITHOUT CONTRAST TECHNIQUE: Contiguous axial images were obtained from the base of the skull through the vertex without intravenous contrast. COMPARISON:  MRI brain September 08, 2020 and head CT September 06, 2020 FINDINGS: Brain: No evidence of acute large vascular territory infarction, hemorrhage, hydrocephalus, extra-axial collection or mass lesion/mass effect. Stable mild burden of chronic microvascular white matter ischemic disease. Stable age-related global parenchymal volume loss. Vascular: No hyperdense vessel. Atherosclerotic calcifications of the internal carotid arteries at skull  base. Skull: Normal. Negative for fracture or focal lesion. Sinuses/Orbits: Paranasal sinuses are predominantly clear. Orbits are unremarkable. Other: Decreased now minimal bilateral mastoid effusions. IMPRESSION: 1. No acute intracranial findings. 2. Stable mild burden of chronic microvascular white matter ischemic disease and age-related global parenchymal volume loss. Electronically Signed   By: Maudry Mayhew MD   On: 10/25/2020 15:28   CT Angio Chest PE W and/or Wo Contrast  Result Date: 10/25/2020 CLINICAL DATA:  Altered mental status. Atrial fibrillation. Pulmonary hypertension. Congestive heart failure. EXAM: CT ANGIOGRAPHY CHEST WITH CONTRAST TECHNIQUE: Multidetector CT imaging of the chest was performed using the standard protocol during bolus administration of intravenous contrast. Multiplanar CT image reconstructions and MIPs were obtained to evaluate the vascular anatomy. CONTRAST:  OMNIPAQUE IOHEXOL 350 MG/ML SOLN COMPARISON:  08/23/2020 FINDINGS: Cardiovascular: Satisfactory opacification of the pulmonary arteries to the segmental level. No evidence of pulmonary embolism. Main pulmonary trunk is dilated at 4.0 cm. Thoracic aorta is nonaneurysmal. Atherosclerotic calcifications of the aorta. Cardiomegaly. No pericardial effusion. Mediastinum/Nodes: No  enlarged mediastinal, hilar, or axillary lymph nodes. Thyroid gland, trachea, and esophagus demonstrate no significant findings. Lungs/Pleura: Trace bilateral pleural effusions. Mosaic attenuation within the right upper lobe. Band-like opacities in the right upper lobe and bibasilar regions. Dependent left basilar atelectasis. No pneumothorax. Upper Abdomen: No acute abnormality. Musculoskeletal: Chronic mild superior endplate compression deformity of T3. Prior cement augmentation of T12. No new or acute osseous findings. Mild degenerative changes throughout the thoracic spine. Review of the MIP images confirms the above findings. IMPRESSION: 1.  Negative for pulmonary embolism. 2. Trace bilateral pleural effusions. 3. Mosaic attenuation within the right upper lobe, which can be seen in the setting of pulmonary edema and small airways or small vessel disease. 4. Band-like opacities in the right upper lobe and bibasilar regions suggesting atelectasis. 5. Findings of chronic cardiomegaly and pulmonary arterial hypertension. 6. Aortic atherosclerosis (ICD10-I70.0). Electronically Signed   By: Duanne Guess D.O.   On: 10/25/2020 17:32   DG Chest Port 1 View  Result Date: 11/21/2020 CLINICAL DATA:  Altered mental status EXAM: PORTABLE CHEST 1 VIEW COMPARISON:  10/25/2020 FINDINGS: Chronic cardiomegaly. Atherosclerotic calcification of the aortic knob. Mild pulmonary vascular congestion and bilateral interstitial prominence. Bandlike opacities within the perihilar and bibasilar regions. No new focal airspace consolidation. Possible trace left pleural effusion. No pneumothorax. Bones are demineralized. IMPRESSION: 1. Findings suggestive CHF with mild edema. 2. Bandlike opacities within the perihilar and bibasilar regions, likely atelectasis. Electronically Signed   By: Duanne Guess D.O.   On: 11/21/2020 17:09   DG Chest Port 1 View  Result Date: 10/25/2020 CLINICAL DATA:  Altered mental status EXAM: PORTABLE CHEST 1 VIEW COMPARISON:  09/20/2020 FINDINGS: Stable cardiomegaly. Atherosclerotic calcification of the aortic knob. Pulmonary vascular congestion with bilateral interstitial prominence. More patchy airspace opacity within the right upper lobe and left perihilar region. No significant pleural fluid collection. No pneumothorax. IMPRESSION: Cardiomegaly with pulmonary vascular congestion and bilateral interstitial prominence. More patchy airspace opacity within the right upper lobe and left perihilar region may reflect asymmetric edema versus infiltrate. Electronically Signed   By: Duanne Guess D.O.   On: 10/25/2020 14:39    Catarina Hartshorn,  DO  [facility]  If 7PM-7AM, please contact night-coverage [email or web address] Password [facility] 11/22/2020, 7:51 AM   LOS: 0 days

## 2020-11-23 ENCOUNTER — Inpatient Hospital Stay (HOSPITAL_COMMUNITY): Payer: PPO

## 2020-11-23 DIAGNOSIS — G9341 Metabolic encephalopathy: Secondary | ICD-10-CM | POA: Diagnosis not present

## 2020-11-23 DIAGNOSIS — J9621 Acute and chronic respiratory failure with hypoxia: Secondary | ICD-10-CM | POA: Diagnosis not present

## 2020-11-23 DIAGNOSIS — I5033 Acute on chronic diastolic (congestive) heart failure: Secondary | ICD-10-CM | POA: Diagnosis not present

## 2020-11-23 LAB — COMPREHENSIVE METABOLIC PANEL
ALT: 13 U/L (ref 0–44)
AST: 19 U/L (ref 15–41)
Albumin: 3.3 g/dL — ABNORMAL LOW (ref 3.5–5.0)
Alkaline Phosphatase: 57 U/L (ref 38–126)
Anion gap: 13 (ref 5–15)
BUN: 13 mg/dL (ref 8–23)
CO2: 32 mmol/L (ref 22–32)
Calcium: 9.2 mg/dL (ref 8.9–10.3)
Chloride: 94 mmol/L — ABNORMAL LOW (ref 98–111)
Creatinine, Ser: 0.83 mg/dL (ref 0.44–1.00)
GFR, Estimated: >60 mL/min (ref >60)
Glucose, Bld: 229 mg/dL — ABNORMAL HIGH (ref 70–99)
Potassium: 3.9 mmol/L (ref 3.5–5.1)
Sodium: 139 mmol/L (ref 135–145)
Total Bilirubin: 0.8 mg/dL (ref 0.3–1.2)
Total Protein: 6.4 g/dL — ABNORMAL LOW (ref 6.5–8.1)

## 2020-11-23 LAB — GLUCOSE, CAPILLARY
Glucose-Capillary: 229 mg/dL — ABNORMAL HIGH (ref 70–99)
Glucose-Capillary: 149 mg/dL — ABNORMAL HIGH (ref 70–99)
Glucose-Capillary: 191 mg/dL — ABNORMAL HIGH (ref 70–99)
Glucose-Capillary: 194 mg/dL — ABNORMAL HIGH (ref 70–99)

## 2020-11-23 LAB — CBC
HCT: 31.8 % — ABNORMAL LOW (ref 36.0–46.0)
Hemoglobin: 9.7 g/dL — ABNORMAL LOW (ref 12.0–15.0)
MCH: 30.2 pg (ref 26.0–34.0)
MCHC: 30.5 g/dL (ref 30.0–36.0)
MCV: 99.1 fL (ref 80.0–100.0)
Platelets: 222 10*3/uL (ref 150–400)
RBC: 3.21 MIL/uL — ABNORMAL LOW (ref 3.87–5.11)
RDW: 15.9 % — ABNORMAL HIGH (ref 11.5–15.5)
WBC: 7.8 10*3/uL (ref 4.0–10.5)
nRBC: 0 % (ref 0.0–0.2)

## 2020-11-23 LAB — MAGNESIUM: Magnesium: 1.8 mg/dL (ref 1.7–2.4)

## 2020-11-23 MED ORDER — POTASSIUM CHLORIDE CRYS ER 10 MEQ PO TBCR: 10.0000 meq | EXTENDED_RELEASE_TABLET | Freq: Once | ORAL | Status: DC

## 2020-11-23 MED ORDER — POTASSIUM CHLORIDE CRYS ER 20 MEQ PO TBCR
20.0000 meq | EXTENDED_RELEASE_TABLET | Freq: Once | ORAL | Status: AC
  Administered 2020-11-23: 20 meq via ORAL
  Filled 2020-11-23: qty 1

## 2020-11-23 MED ORDER — DM-GUAIFENESIN ER 30-600 MG PO TB12
1.0000 | ORAL_TABLET | Freq: Two times a day (BID) | ORAL | Status: DC
  Administered 2020-11-23 – 2020-11-24 (×3): 1 via ORAL
  Filled 2020-11-23 (×4): qty 1

## 2020-11-23 MED ORDER — MAGNESIUM SULFATE 2 GM/50ML IV SOLN
2.0000 g | Freq: Once | INTRAVENOUS | Status: AC
  Administered 2020-11-23: 2 g via INTRAVENOUS
  Filled 2020-11-23: qty 50

## 2020-11-23 MED ORDER — INSULIN GLARGINE 100 UNIT/ML ~~LOC~~ SOLN
10.0000 [IU] | Freq: Every day | SUBCUTANEOUS | Status: DC
  Administered 2020-11-23: 10 [IU] via SUBCUTANEOUS
  Filled 2020-11-23 (×2): qty 0.1

## 2020-11-23 MED ORDER — LIDOCAINE 5 % EX PTCH
1.0000 | MEDICATED_PATCH | Freq: Every day | CUTANEOUS | Status: DC
  Administered 2020-11-23 – 2020-11-24 (×2): 1 via TRANSDERMAL
  Filled 2020-11-23 (×2): qty 1

## 2020-11-23 NOTE — Evaluation (Signed)
Physical Therapy Evaluation Patient Details Name: [name] MRN: [id number] DOB: 09-30-1950 Today's Date: 11/23/2020   History of Present Illness  [name] is a 70 y.o. female with medical history significant for CHF (LVEF on 08/29/2020 was 50 to 55% with no RWMA), pulm hypertension, atrial fibrillation, hypertension, hyperlipidemia, T2DM, dementia, hypothyroidism and chronic pain who presents to the emergency department via EMS due to a 2-day history of confusion, increased unsteady gait, occasional slurred speech and occasional visual hallucination.  History was obtained from ED PA and daughter at bedside, per daughter, patient started to present with increased confusion 2 days ago (11/19/2020) and she was noted to have occasional slurred speech and to have meaningless conversation that was completely off engaged topic.  Patient was recently started on Namenda about a week ago and has had no other changes in medication.  She ambulates with a walker, but gait recently became more unsteady, though she has not sustained any fall.  She denies chest pain, fever, chills, nausea, vomiting or abdominal pain.    Clinical Impression  Patient requires encouragement to participate, her daughter present in room and assisted with motivating patient to get up.  Patient functioning near baseline for functional mobility and gait demonstrating slow labored movement for sitting up at bedside and limited to ambulation in room due to c/o fatigue.  Patient tolerated sitting up in wheelchair to be taken to procedure by nursing staff after therapy.  Patient will benefit from continued physical therapy in hospital and recommended venue below to increase strength, balance, endurance for safe ADLs and gait.     Follow Up Recommendations Supervision for mobility/OOB;Supervision - Intermittent;Home health PT    Equipment Recommendations  None recommended by PT    Recommendations for Other Services       Precautions  / Restrictions Precautions Precautions: Fall Restrictions Weight Bearing Restrictions: No      Mobility  Bed Mobility Overal bed mobility: Needs Assistance Bed Mobility: Supine to Sit     Supine to sit: Min assist     General bed mobility comments: increased time, labored movement    Transfers Overall transfer level: Needs assistance Equipment used: Rolling walker (2 wheeled) Transfers: Sit to/from [employer] Sit to Stand: Min guard;Min assist Stand pivot transfers: Min guard;Min assist       General transfer comment: slightly unsteady, frequent verbal/tactile cueing  Ambulation/Gait Ambulation/Gait assistance: Min guard;Min assist Gait Distance (Feet): 20 Feet Assistive device: Rolling walker (2 wheeled) Gait Pattern/deviations: Decreased step length - right;Decreased step length - left;Decreased stride length Gait velocity: decreased   General Gait Details: slightly labored cadence without loss of balance, limited mostly due to fatigue, on 4 LPM with SpO2 at 98-100%  Stairs            Wheelchair Mobility    Modified Rankin (Stroke Patients Only)       Balance Overall balance assessment: Needs assistance Sitting-balance support: Feet supported;No upper extremity supported Sitting balance-Leahy Scale: Good Sitting balance - Comments: seated at EOB   Standing balance support: During functional activity;Bilateral upper extremity supported Standing balance-Leahy Scale: Fair Standing balance comment: using RW                             Pertinent Vitals/Pain Pain Assessment: No/denies pain    Home Living Family/patient expects to be discharged to:: Private residence Living Arrangements: Children;Spouse/significant other Available Help at Discharge: Family;Available 24 hours/day Type of Home:  Mobile home Home Access: Stairs to enter Entrance Stairs-Rails: Right;Left;Can reach both Entrance Stairs-Number of Steps:  5 Home Layout: One level Home Equipment: Bedside commode;Toilet riser;Shower seat;Walker - 2 wheels;Cane - single point;Wheelchair - manual      Prior Function Level of Independence: Needs assistance   Gait / Transfers Assistance Needed: [place] ambulator using RW, on 2 LPM O2 constant  ADL's / Homemaking Assistance Needed: assisted by family        Hand Dominance   Dominant Hand: Right    Extremity/Trunk Assessment   Upper Extremity Assessment Upper Extremity Assessment: Generalized weakness    Lower Extremity Assessment Lower Extremity Assessment: Generalized weakness    Cervical / Trunk Assessment Cervical / Trunk Assessment: Normal  Communication   Communication: No difficulties  Cognition Arousal/Alertness: Awake/alert Behavior During Therapy: Anxious Overall Cognitive Status: History of cognitive impairments - at baseline                                        General Comments      Exercises     Assessment/Plan    PT Assessment Patient needs continued PT services  PT Problem List Decreased strength;Decreased activity tolerance;Decreased balance;Decreased mobility       PT Treatment Interventions DME instruction;Gait training;Stair training;Functional mobility training;Therapeutic activities;Therapeutic exercise;Patient/family education;Balance training    PT Goals (Current goals can be found in the Care Plan section)  Acute Rehab PT Goals Patient Stated Goal: return home with family to assist PT Goal Formulation: With patient/family Time For Goal Achievement: 11/27/20 Potential to Achieve Goals: Good    Frequency Min 3X/week   Barriers to discharge        Co-evaluation               AM-PAC PT "6 Clicks" Mobility  Outcome Measure Help needed turning from your back to your side while in a flat bed without using bedrails?: None Help needed moving from lying on your back to sitting on the side of a flat bed without using  bedrails?: A Little Help needed moving to and from a bed to a chair (including a wheelchair)?: A Little Help needed standing up from a chair using your arms (e.g., wheelchair or bedside chair)?: A Little Help needed to walk in hospital room?: A Little Help needed climbing 3-5 steps with a railing? : A Lot 6 Click Score: 18    End of Session Equipment Utilized During Treatment: Oxygen Activity Tolerance: Patient tolerated treatment well;Patient limited by fatigue Patient left: in chair;with call bell/phone within reach;with family/visitor present Nurse Communication: Mobility status PT Visit Diagnosis: Unsteadiness on feet (R26.81);Other abnormalities of gait and mobility (R26.89);Muscle weakness (generalized) (M62.81)    Time: 1610-9604 PT Time Calculation (min) (ACUTE ONLY): 24 min   Charges:   PT Evaluation $PT Eval Moderate Complexity: 1 Mod PT Treatments $Therapeutic Activity: 23-37 mins        2:41 PM, 11/23/20 Ocie Bob, MPT Physical Therapist with [facility] [phone] office [phone] mobile phone

## 2020-11-23 NOTE — Plan of Care (Signed)
  Problem: Acute Rehab PT Goals(only PT should resolve) Goal: Pt Will Go Supine/Side To Sit Outcome: Progressing Flowsheets (Taken 11/23/2020 1442) Pt will go Supine/Side to Sit: with supervision Goal: Patient Will Transfer Sit To/From Stand Outcome: Progressing Flowsheets (Taken 11/23/2020 1442) Patient will transfer sit to/from stand: with min guard assist Goal: Pt Will Transfer Bed To Chair/Chair To Bed Outcome: Progressing Flowsheets (Taken 11/23/2020 1442) Pt will Transfer Bed to Chair/Chair to Bed: min guard assist Goal: Pt Will Ambulate Outcome: Progressing Flowsheets (Taken 11/23/2020 1442) Pt will Ambulate:  50 feet  with min guard assist  with rolling walker   2:44 PM, 11/23/20 Ocie Bob, MPT Physical Therapist with [facility] [phone] office [phone] mobile phone

## 2020-11-23 NOTE — Progress Notes (Addendum)
PROGRESS NOTE  [name] VHQ:[id number] DOB: 1951/06/23 DOA: 11/21/2020 PCP: Gareth Morgan, MD   Brief History:  70 year old female with a history of diastolic CHF, chronic atrial fibrillation, pulmonary hypertension, hypertension, hyperlipidemia, diabetes mellitus type 2, coronary artery disease with NSTEMI February 2022, CKD stage III, seizure disorder, chronic back pain, and dementia presenting with 2-day history of confusion and unsteady gait.  The patient is a poor historian secondary to her dementia.  History provided by the patient's daughter reveals that the patient has had some confusion and hallucinations that began on 11/18/2020.  Daughter has noted some gait instability.  Daughter states that she feels that the patient has had some occasional slurred speech.  There is been no reports of fevers, chills, chest pain, worsening shortness of breath, headache, nausea, vomiting, diarrhea, abdominal pain, hematochezia, melena. The patient had a recent prolonged hospitalization from 08/23/2020 to 09/21/2020.  During that hospitalization, the patient was treated for sepsis secondary to pneumonia.  She required intubation at the time of admission, but was subsequently extubated on 08/26/2020.  She required reintubation on 08/30/2020 due to complete atelectasis of the left lung from mucous plugging.  She was transferred to [facility] on that date and was treated until the time of discharge.  Skilled nursing facility was refused by the patient's family.  The patient was taken home with home health physical therapy.  The patient's discharge weight at that time was 214 pounds. Daughter states the patient has been doing fairly well since discharge with improving functional mobility and independence.  The patient was using a walker to assist with ambulation. Daughter states that the patient was recently started on Namenda on 11/17/2020.  No other new medications. In the emergency department, the  patient was afebrile hemodynamically stable with oxygen saturation 94% on 4 L.  BMP showed a sodium 139 and serum creatinine 0.74.  WBC 6.0, globin 9.6, platelets 209,000.  Chest x-ray showed increasing vascular congestion.  UA was negative for pyuria.  Neurology was contacted by ED, and felt that the patient could stay at [facility] for further evaluation.   the patient was admitted for further evaluation and treatment.  Assessment/Plan: Acute on chronic respiratory failure with hypoxia -Chronically on 2 L nasal cannula at home -Currently stable on 3.5 L>>2L -Secondary to pulmonary edema -11/22/20 CT chest--Slight increase in areas of patchy and bandlike opacity in the chest, also with associated air trapping. No frank consolidative changes -Wean oxygen back to baseline level -personally reviewed CXR--increased interstitial markings -PCT <0.10  Acute on chronic diastolic CHF -08/29/2020 echo EF 30 to 55%, no WMA, PASP 77.7 -Patient is on furosemide 20 mg p.o. daily at home -Continue IV furosemide -She remains clinically fluid overloaded -09/22/2020 discharge weight 214 -11/20/2020 home weight 221 -Daily weights -Accurate I's and O's  Acute metabolic encephalopathy -Multifactorial including decompensated CHF/hypoxia, possibly Namenda introduction, and progression of underlying dementia -Patient remains confused with intermittent hallucination -Urine negative for pyuria -Check TSH--0.526 -B12--5145 -Folic acid--25.7 -Minimize hypnotic drugs -11/23/20--overall improved, near baseline -MR brain--neg for stroke  Chronic atrial fibrillation -Continue amiodarone -Continue apixaban  Hyperlipidemia -Continue statin  Dementia without behavioral disturbance -Continue Aricept -She is on as needed Ativan at home  Hypothyroidism -Continue Synthroid  Diabetes mellitus type 2 uncontrolled with hyperglycemia -NovoLog sliding scale -08/24/2020 hemoglobin A1c 7.9  CKD stage  IIIa -Baseline creatinine 0.8-1.1  Coronary artery disease -Patient had NSTEMI February 2022 -She had transient suppression of  her LVEF (15-20%) on echo 08/17/2020 -Continue apixaban -personally reviewed EKG--afib, nonspecific STT change  History of stroke -Continue apixaban  Seizure disorder -She is on Keppra 1000 mg twice daily  Anemia of chronic disease -Baseline Hgb ~10 -Transfuse for hemoglobin less than 7 or if she has significant bleeding  OSA/OHS -Patient intolerant of CPAP  Morbid obesity -BMI 36.72 -Lifestyle modification  Chronic back pain -She was on as needed hydrocodone at home   Stage I sacral ulcer -Present on admission -Local wound care  Hypomagnesemia -replete    Status is: Inpatient  The patient will require care spanning > 2 midnights and should be moved to inpatient because: Inpatient level of care appropriate due to severity of illness  Dispo: The patient is from: Home  Anticipated d/c is to: Home  Patient currently is not medically stable to d/c.              Difficult to place patient No        Family Communication:   Daughter updated at bedside 5/23  Consultants:  none  Code Status:  FULL   DVT Prophylaxis:  apixaban   Procedures: As Listed in Progress Note Above  Antibiotics: None    Subjective: Patient denies fevers, chills, headache, chest pain, dyspnea, nausea, vomiting, diarrhea, abdominal pain, dysuria, hematuria,    Objective: Vitals:   11/22/20 2021 11/23/20 0447 11/23/20 1055 11/23/20 1321  BP: 119/73 122/74 119/71 114/68  Pulse: 93 92 94 75  Resp: 18 18  19   Temp: 98 F (36.7 C) 98.5 F (36.9 C)  (!) 97.5 F (36.4 C)  TempSrc:  Oral  Oral  SpO2: 98% 96% 93% 96%  Weight:        Intake/Output Summary (Last 24 hours) at 11/23/2020 1814 Last data filed at 11/23/2020 1300 Gross per 24 hour  Intake 240 ml  Output 600 ml  Net -360 ml   Weight  change:  Exam:   General:  Pt is alert, follows commands appropriately, not in acute distress  HEENT: No icterus, No thrush, No neck mass, [place]/AT  Cardiovascular: IRRR, S1/S2, no rubs, no gallops  Respiratory: bibasilar crackles. No wheeze  Abdomen: Soft/+BS, non tender, non distended, no guarding  Extremities: No edema, No lymphangitis, No petechiae, No rashes, no synovitis   Data Reviewed: I have personally reviewed following labs and imaging studies Basic Metabolic Panel: Recent Labs  Lab 11/21/20 1656 11/22/20 0403 11/23/20 0424  NA 139 139 139  K 4.7 4.0 3.9  CL 100 97* 94*  CO2 33* 32 32  GLUCOSE 180* 168* 229*  BUN 13 12 13   CREATININE 0.74 0.73 0.83  CALCIUM 9.1 9.1 9.2  MG  --  1.6* 1.8  PHOS  --  4.0  --    Liver Function Tests: Recent Labs  Lab 11/21/20 1656 11/22/20 0403 11/23/20 0424  AST 14* 16 19  ALT 13 13 13   ALKPHOS 56 56 57  BILITOT 0.4 0.7 0.8  PROT 6.3* 6.3* 6.4*  ALBUMIN 3.3* 3.1* 3.3*   No results for input(s): LIPASE, AMYLASE in the last 168 hours. No results for input(s): AMMONIA in the last 168 hours. Coagulation Profile: Recent Labs  Lab 11/22/20 0403  INR 1.5*   CBC: Recent Labs  Lab 11/21/20 1656 11/22/20 0403 11/23/20 0424  WBC 6.0 6.5 7.8  NEUTROABS 3.8  --   --   HGB 9.6* 9.2* 9.7*  HCT 32.5* 31.2* 31.8*  MCV 102.8* 100.0 99.1  PLT 209 201 222  Cardiac Enzymes: No results for input(s): CKTOTAL, CKMB, CKMBINDEX, TROPONINI in the last 168 hours. BNP: Invalid input(s): POCBNP CBG: Recent Labs  Lab 11/22/20 1602 11/22/20 2019 11/23/20 0717 11/23/20 1104 11/23/20 1614  GLUCAP 210* 112* 229* 149* 191*   HbA1C: Recent Labs    11/22/20 0403  HGBA1C 7.6*   Urine analysis:    Component Value Date/Time   COLORURINE AMBER (A) 11/21/2020 1820   APPEARANCEUR HAZY (A) 11/21/2020 1820   LABSPEC 1.025 11/21/2020 1820   PHURINE 5.0 11/21/2020 1820   GLUCOSEU NEGATIVE 11/21/2020 1820   HGBUR NEGATIVE  11/21/2020 1820   BILIRUBINUR NEGATIVE 11/21/2020 1820   KETONESUR NEGATIVE 11/21/2020 1820   PROTEINUR 30 (A) 11/21/2020 1820   UROBILINOGEN 0.2 02/05/2015 1700   NITRITE NEGATIVE 11/21/2020 1820   LEUKOCYTESUR NEGATIVE 11/21/2020 1820   Sepsis Labs: @LABRCNTIP (procalcitonin:4,lacticidven:4) ) Recent Results (from the past 240 hour(s))  SARS CORONAVIRUS 2 (Jeliyah Middlebrooks 6-24 HRS) Nasopharyngeal Nasopharyngeal Swab     Status: None   Collection Time: 11/21/20  8:48 PM   Specimen: Nasopharyngeal Swab  Result Value Ref Range Status   SARS Coronavirus 2 NEGATIVE NEGATIVE Final    Comment: (NOTE) SARS-CoV-2 target nucleic acids are NOT DETECTED.  The SARS-CoV-2 RNA is generally detectable in upper and lower respiratory specimens during the acute phase of infection. Negative results do not preclude SARS-CoV-2 infection, do not rule out co-infections with other pathogens, and should not be used as the sole basis for treatment or other patient management decisions. Negative results must be combined with clinical observations, patient history, and epidemiological information. The expected result is Negative.  Fact Sheet for Patients: 11/23/20  Fact Sheet for Healthcare Providers: HairSlick.no  This test is not yet approved or cleared by the quierodirigir.com FDA and  has been authorized for detection and/or diagnosis of SARS-CoV-2 by FDA under an Emergency Use Authorization (EUA). This EUA will remain  in effect (meaning this test can be used) for the duration of the COVID-19 declaration under Se ction 564(b)(1) of the Act, 21 U.S.C. section 360bbb-3(b)(1), unless the authorization is terminated or revoked sooner.  Performed at [facility], [address], [place], [place]      Scheduled Meds: . amiodarone  200 mg Oral q morning  . apixaban  5 mg Oral BID  . atorvastatin  80 mg Per Tube Daily  .  dextromethorphan-guaiFENesin  1 tablet Oral BID  . feeding supplement (GLUCERNA SHAKE)  237 mL Oral TID BM  . furosemide  40 mg Intravenous Daily  . insulin aspart  0-15 Units Subcutaneous TID WC  . insulin aspart  0-5 Units Subcutaneous QHS  . levETIRAcetam  1,000 mg Oral BID  . levothyroxine  175 mcg Oral Q0600  . lidocaine  1 patch Transdermal Daily  . melatonin  6 mg Oral QHS  . pantoprazole  40 mg Oral BID   Continuous Infusions:  Procedures/Studies: CT Head Wo Contrast  Result Date: 11/21/2020 CLINICAL DATA:  71 year old female with altered mental status. EXAM: CT HEAD WITHOUT CONTRAST TECHNIQUE: Contiguous axial images were obtained from the base of the skull through the vertex without intravenous contrast. COMPARISON:  10/25/2020 CT and prior studies FINDINGS: Brain: No evidence of acute infarction, hemorrhage, hydrocephalus, extra-axial collection or mass lesion/mass effect. Mild atrophy and chronic small-vessel white matter ischemic changes noted. Vascular: Carotid atherosclerotic calcifications are noted. Skull: Normal. Negative for fracture or focal lesion. Sinuses/Orbits: No acute finding. Other: None. IMPRESSION: 1. No evidence of acute intracranial  abnormality. 2. Mild atrophy and chronic small-vessel white matter ischemic changes. Electronically Signed   By: Harmon Pier M.D.   On: 11/21/2020 17:28   CT Head Wo Contrast  Result Date: 10/25/2020 CLINICAL DATA:  Altered mental status EXAM: CT HEAD WITHOUT CONTRAST TECHNIQUE: Contiguous axial images were obtained from the base of the skull through the vertex without intravenous contrast. COMPARISON:  MRI brain September 08, 2020 and head CT September 06, 2020 FINDINGS: Brain: No evidence of acute large vascular territory infarction, hemorrhage, hydrocephalus, extra-axial collection or mass lesion/mass effect. Stable mild burden of chronic microvascular white matter ischemic disease. Stable age-related global parenchymal volume loss. Vascular:  No hyperdense vessel. Atherosclerotic calcifications of the internal carotid arteries at skull base. Skull: Normal. Negative for fracture or focal lesion. Sinuses/Orbits: Paranasal sinuses are predominantly clear. Orbits are unremarkable. Other: Decreased now minimal bilateral mastoid effusions. IMPRESSION: 1. No acute intracranial findings. 2. Stable mild burden of chronic microvascular white matter ischemic disease and age-related global parenchymal volume loss. Electronically Signed   By: Maudry Mayhew MD   On: 10/25/2020 15:28   CT CHEST WO CONTRAST  Result Date: 11/22/2020 CLINICAL DATA:  Pneumonia, suspected effusion.  Dyspnea. EXAM: CT CHEST WITHOUT CONTRAST TECHNIQUE: Multidetector CT imaging of the chest was performed following the standard protocol without IV contrast. COMPARISON:  CT head of the same date. FINDINGS: Cardiovascular: Calcified atheromatous plaque in the thoracic aorta. Heart size remains enlarged with signs of mitral annular calcification. No substantial pericardial effusion again with marked cardiac enlargement. Central pulmonary vasculature with main pulmonary artery at 4 cm greatest caliber. Grossly unchanged compared to previous imaging. Mediastinum/Nodes: Thoracic inlet structures are normal. No axillary lymphadenopathy. No mediastinal lymphadenopathy with scattered reactive size lymph nodes throughout the chest. No gross hilar adenopathy on noncontrast imaging. Lungs/Pleura: Limited parenchymal assessment in the setting of respiratory motion. Slight increase in areas of volume loss and bandlike opacity in both the RIGHT and LEFT chest. Suspect air trapping with areas of mosaic attenuation as well. Airways with similar appearance, no large areas of filling defect or material in the major bronchi or in the trachea. Trace effusions as before. Upper Abdomen: Post cholecystectomy. No acute findings in the upper abdomen on limited assessment. Small hiatal hernia and mildly patulous  esophagus. Musculoskeletal: Spinal degenerative changes. Cement augmentation at T12 as before. No acute or destructive bone finding. Upper thoracic compression fracture as before. IMPRESSION: 1. Slight increase in areas of patchy and bandlike opacity in the chest, also with associated air trapping. No frank consolidative changes. Findings could be related to asymmetric edema with atelectasis or ongoing infection. Correlate with any history of COVID-19 infection. 2. Trace effusions as before. 3. Marked cardiac enlargement with signs of mitral annular calcification. 4. Central pulmonary artery at 4 cm, compatible with pulmonary arterial hypertension. 5. Small hiatal hernia and mildly patulous esophagus. 6. Aortic atherosclerosis. Electronically Signed   By: Donzetta Kohut M.D.   On: 11/22/2020 11:05   CT Angio Chest PE W and/or Wo Contrast  Result Date: 10/25/2020 CLINICAL DATA:  Altered mental status. Atrial fibrillation. Pulmonary hypertension. Congestive heart failure. EXAM: CT ANGIOGRAPHY CHEST WITH CONTRAST TECHNIQUE: Multidetector CT imaging of the chest was performed using the standard protocol during bolus administration of intravenous contrast. Multiplanar CT image reconstructions and MIPs were obtained to evaluate the vascular anatomy. CONTRAST:  OMNIPAQUE IOHEXOL 350 MG/ML SOLN COMPARISON:  08/23/2020 FINDINGS: Cardiovascular: Satisfactory opacification of the pulmonary arteries to the segmental level. No evidence of pulmonary embolism.  Main pulmonary trunk is dilated at 4.0 cm. Thoracic aorta is nonaneurysmal. Atherosclerotic calcifications of the aorta. Cardiomegaly. No pericardial effusion. Mediastinum/Nodes: No enlarged mediastinal, hilar, or axillary lymph nodes. Thyroid gland, trachea, and esophagus demonstrate no significant findings. Lungs/Pleura: Trace bilateral pleural effusions. Mosaic attenuation within the right upper lobe. Band-like opacities in the right upper lobe and bibasilar  regions. Dependent left basilar atelectasis. No pneumothorax. Upper Abdomen: No acute abnormality. Musculoskeletal: Chronic mild superior endplate compression deformity of T3. Prior cement augmentation of T12. No new or acute osseous findings. Mild degenerative changes throughout the thoracic spine. Review of the MIP images confirms the above findings. IMPRESSION: 1. Negative for pulmonary embolism. 2. Trace bilateral pleural effusions. 3. Mosaic attenuation within the right upper lobe, which can be seen in the setting of pulmonary edema and small airways or small vessel disease. 4. Band-like opacities in the right upper lobe and bibasilar regions suggesting atelectasis. 5. Findings of chronic cardiomegaly and pulmonary arterial hypertension. 6. Aortic atherosclerosis (ICD10-I70.0). Electronically Signed   By: Duanne Guess D.O.   On: 10/25/2020 17:32   MR BRAIN WO CONTRAST  Result Date: 11/23/2020 CLINICAL DATA:  Neuro deficit, acute stroke suspected. EXAM: MRI HEAD WITHOUT CONTRAST TECHNIQUE: Multiplanar, multiecho pulse sequences of the brain and surrounding structures were obtained without intravenous contrast. COMPARISON:  CT head Nov 21, 2020.  MRI head September 08, 2020. FINDINGS: Significantly motion limited and incomplete examination due to patient intolerance. Within this limitation: Brain: No evidence of acute infarct, hydrocephalus, acute hemorrhage, mass lesion, midline shift, or large extra-axial fluid collection. Similar mild atrophy with ex vacuo ventricular dilation. Mild patchy T2/FLAIR hyperintensity within the white matter is suboptimally evaluated due to motion but most likely related to chronic microvascular ischemic disease. Vascular: Major arterial flow voids are maintained at the skull base. Skull and upper cervical spine: Limited evaluation with out evidence of abnormal marrow signal. Sinuses/Orbits: Mild paranasal sinus mucosal thickening without air-fluid levels. Unremarkable orbits.  Other: No sizable mastoid effusions. IMPRESSION: No evidence of acute infarction, hemorrhage or mass on this significantly motion limited and incomplete exam. Electronically Signed   By: Feliberto Harts MD   On: 11/23/2020 11:23   DG Chest Port 1 View  Result Date: 11/21/2020 CLINICAL DATA:  Altered mental status EXAM: PORTABLE CHEST 1 VIEW COMPARISON:  10/25/2020 FINDINGS: Chronic cardiomegaly. Atherosclerotic calcification of the aortic knob. Mild pulmonary vascular congestion and bilateral interstitial prominence. Bandlike opacities within the perihilar and bibasilar regions. No new focal airspace consolidation. Possible trace left pleural effusion. No pneumothorax. Bones are demineralized. IMPRESSION: 1. Findings suggestive CHF with mild edema. 2. Bandlike opacities within the perihilar and bibasilar regions, likely atelectasis. Electronically Signed   By: Duanne Guess D.O.   On: 11/21/2020 17:09   DG Chest Port 1 View  Result Date: 10/25/2020 CLINICAL DATA:  Altered mental status EXAM: PORTABLE CHEST 1 VIEW COMPARISON:  09/20/2020 FINDINGS: Stable cardiomegaly. Atherosclerotic calcification of the aortic knob. Pulmonary vascular congestion with bilateral interstitial prominence. More patchy airspace opacity within the right upper lobe and left perihilar region. No significant pleural fluid collection. No pneumothorax. IMPRESSION: Cardiomegaly with pulmonary vascular congestion and bilateral interstitial prominence. More patchy airspace opacity within the right upper lobe and left perihilar region may reflect asymmetric edema versus infiltrate. Electronically Signed   By: Duanne Guess D.O.   On: 10/25/2020 14:39   ECHOCARDIOGRAM LIMITED  Result Date: 11/22/2020    ECHOCARDIOGRAM LIMITED REPORT   Patient Name:   [name] Date of  Exam: 11/22/2020 Medical Rec #:  [id number]     Height:       66.0 in Accession #:    [id number]    Weight:       227.5 lb Date of Birth:  [birth year]     BSA:           2.112 m Patient Age:    41 years      BP:           113/62 mmHg Patient Gender: F             HR:           85 bpm. Exam Location:  Inpatient Procedure: Limited Echo, Cardiac Doppler and Limited Color Doppler Indications:    CHF-Acute Systolic I50.21  History:        Patient has prior history of Echocardiogram examinations, most                 recent 08/29/2020. CAD and Previous Myocardial Infarction,                 Pulmonary HTN and Stroke, Arrythmias:Atrial Fibrillation,                 Signs/Symptoms:Dementia; Risk Factors:Hypertension, Dyslipidemia                 and Diabetes. Acute on chronic respiratory failure with hypoxia,                  Acute metabolic encephalopathy, Chronic kidney disease, anemia.  Sonographer:    Leta Jungling RDCS Referring Phys: [id number] OLADAPO ADEFESO IMPRESSIONS  1. Left ventricular ejection fraction, by estimation, is 55 to 60%. The left ventricle has normal function. There is moderate left ventricular hypertrophy. Left ventricular diastolic parameters are indeterminate.  2. Right ventricular systolic function is normal. The right ventricular size is normal. There is severely elevated pulmonary artery systolic pressure.  3. Left atrial size was severely dilated.  4. Right atrial size was severely dilated.  5. Mild mitral valve regurgitation. No evidence of mitral stenosis. Moderate mitral annular calcification.  6. The tricuspid valve is abnormal. Tricuspid valve regurgitation is moderate.  7. The aortic valve is tricuspid. There is mild calcification of the aortic valve. There is mild thickening of the aortic valve. Aortic valve regurgitation is not visualized. No aortic stenosis is present.  8. Severe pulmonary HTN, PASP is 71 mmHg.  9. The inferior vena cava is dilated in size with <50% respiratory variability, suggesting right atrial pressure of 15 mmHg. 10. Limited echo FINDINGS  Left Ventricle: Left ventricular ejection fraction, by estimation, is 55 to 60%. The left  ventricle has normal function. There is moderate left ventricular hypertrophy. Left ventricular diastolic parameters are indeterminate. Right Ventricle: The right ventricular size is normal. No increase in right ventricular wall thickness. Right ventricular systolic function is normal. There is severely elevated pulmonary artery systolic pressure. The tricuspid regurgitant velocity is 3.75 m/s, and with an assumed right atrial pressure of 15 mmHg, the estimated right ventricular systolic pressure is 71.2 mmHg. Left Atrium: Left atrial size was severely dilated. Right Atrium: Right atrial size was severely dilated. Mitral Valve: There is mild thickening of the mitral valve leaflet(s). There is mild calcification of the mitral valve leaflet(s). Moderate mitral annular calcification. Mild mitral valve regurgitation. No evidence of mitral valve stenosis. Tricuspid Valve: The tricuspid valve is abnormal. Tricuspid valve regurgitation is moderate . No evidence of tricuspid stenosis. Aortic Valve: The aortic  valve is tricuspid. There is mild calcification of the aortic valve. There is mild thickening of the aortic valve. There is mild aortic valve annular calcification. Aortic valve regurgitation is not visualized. No aortic stenosis  is present. Pulmonic Valve: The pulmonic valve was not well visualized. Pulmonic valve regurgitation is not visualized. No evidence of pulmonic stenosis. Pulmonary Artery: Severe pulmonary HTN, PASP is 71 mmHg. Venous: The inferior vena cava is dilated in size with less than 50% respiratory variability, suggesting right atrial pressure of 15 mmHg. LEFT VENTRICLE PLAX 2D LVIDd:         5.02 cm      Diastology LVIDs:         3.99 cm      LV e' medial:    7.45 cm/s LV PW:         1.32 cm      LV E/e' medial:  20.6 LV IVS:        1.46 cm      LV e' lateral:   8.82 cm/s LVOT diam:     2.00 cm      LV E/e' lateral: 17.4 LV SV:         56 LV SV Index:   27 LVOT Area:     3.14 cm  LV Volumes (MOD)  LV vol d, MOD A2C: 111.0 ml LV vol d, MOD A4C: 83.5 ml LV vol s, MOD A2C: 59.2 ml LV vol s, MOD A4C: 36.8 ml LV SV MOD A2C:     51.8 ml LV SV MOD A4C:     83.5 ml LV SV MOD BP:      50.3 ml LEFT ATRIUM              Index LA Vol (A2C):   109.0 ml 51.60 ml/m LA Vol (A4C):   83.1 ml  39.34 ml/m LA Biplane Vol: 95.8 ml  45.35 ml/m  AORTIC VALVE LVOT Vmax:   108.63 cm/s LVOT Vmean:  75.033 cm/s LVOT VTI:    0.179 m MITRAL VALVE                TRICUSPID VALVE MV Area (PHT): 5.07 cm     TR Peak grad:   56.2 mmHg MV Decel Time: 150 msec     TR Vmax:        375.00 cm/s MV E velocity: 153.67 cm/s                             SHUNTS                             Systemic VTI:  0.18 m                             Systemic Diam: 2.00 cm Dina RichJonathan Branch MD Electronically signed by Dina RichJonathan Branch MD Signature Date/Time: 11/22/2020/2:03:14 PM    Final     Catarina Hartshornavid Vennie Salsbury, DO  [facility]  If 7PM-7AM, please contact night-coverage [email or web address] Password [email or web address] 11/23/2020, 6:14 PM   LOS: 1 day

## 2020-11-23 NOTE — Progress Notes (Signed)
Inpatient Diabetes Program Recommendations  AACE/ADA: New Consensus Statement on Inpatient Glycemic Control (2015)  Target Ranges:  Prepandial:   less than 140 mg/dL      Peak postprandial:   less than 180 mg/dL (1-2 hours)      Critically ill patients:  140 - 180 mg/dL   Lab Results  Component Value Date   GLUCAP 149 (H) 11/23/2020   HGBA1C 7.6 (H) 11/22/2020    Review of Glycemic Control  Diabetes history: DM2 Outpatient Diabetes medications: Lantus 20 units QD Current orders for Inpatient glycemic control: Novolog 0-15 units TID with meals and 0-5 HS  HgbA1C - 7.6% FBS 229 this am. FBS 208 on 5/22.  May benefit from adding part of home Lantus dose.  Inpatient Diabetes Program Recommendations:     Consider adding Lantus 10 units QHS  Will follow glucose trends.  Thank you. Ailene Ards, RD, LDN, CDE Inpatient Diabetes Coordinator [phone]

## 2020-11-24 DIAGNOSIS — R569 Unspecified convulsions: Secondary | ICD-10-CM | POA: Diagnosis not present

## 2020-11-24 DIAGNOSIS — G9341 Metabolic encephalopathy: Secondary | ICD-10-CM | POA: Diagnosis not present

## 2020-11-24 DIAGNOSIS — I5033 Acute on chronic diastolic (congestive) heart failure: Secondary | ICD-10-CM | POA: Diagnosis not present

## 2020-11-24 DIAGNOSIS — Z7901 Long term (current) use of anticoagulants: Secondary | ICD-10-CM | POA: Diagnosis not present

## 2020-11-24 LAB — BASIC METABOLIC PANEL
Anion gap: 11 (ref 5–15)
BUN: 12 mg/dL (ref 8–23)
CO2: 35 mmol/L — ABNORMAL HIGH (ref 22–32)
Calcium: 9.2 mg/dL (ref 8.9–10.3)
Chloride: 94 mmol/L — ABNORMAL LOW (ref 98–111)
Creatinine, Ser: 0.83 mg/dL (ref 0.44–1.00)
GFR, Estimated: >60 mL/min (ref >60)
Glucose, Bld: 198 mg/dL — ABNORMAL HIGH (ref 70–99)
Potassium: 3.3 mmol/L — ABNORMAL LOW (ref 3.5–5.1)
Sodium: 140 mmol/L (ref 135–145)

## 2020-11-24 LAB — GLUCOSE, CAPILLARY: Glucose-Capillary: 186 mg/dL — ABNORMAL HIGH (ref 70–99)

## 2020-11-24 LAB — MAGNESIUM: Magnesium: 2 mg/dL (ref 1.7–2.4)

## 2020-11-24 MED ORDER — POTASSIUM CHLORIDE CRYS ER 20 MEQ PO TBCR
20.0000 meq | EXTENDED_RELEASE_TABLET | Freq: Once | ORAL | Status: AC
  Administered 2020-11-24: 20 meq via ORAL
  Filled 2020-11-24: qty 1

## 2020-11-24 MED ORDER — LIVING BETTER WITH HEART FAILURE BOOK: Freq: Once | Status: AC

## 2020-11-24 NOTE — Discharge Summary (Signed)
Physician Discharge Summary  [name]:[id number] DOB: Aug 04, 1950 DOA: 11/21/2020  PCP: Gareth Morgan, MD  Admit date: 11/21/2020 Discharge date: 11/24/2020  Admitted From: Home Disposition:  Home   Recommendations for Outpatient Follow-up:  1. Follow up with PCP in 1-2 weeks 2. Please obtain BMP/CBC in one week    Equipment/Devices: 2L oxygen  Discharge Condition: Stable CODE STATUS: FULL Diet recommendation: Heart Healthy / Carb Modified    Brief/Interim Summary: 70 year old female with a history of diastolic CHF, chronic atrial fibrillation, pulmonary hypertension, hypertension, hyperlipidemia, diabetes mellitus type 2, coronary artery disease with NSTEMI February 2022, CKD stage III, seizure disorder, chronic back pain, and dementia presenting with 2-day history of confusion and unsteady gait. The patient is a poor historian secondary to her dementia. History provided by the patient's daughter reveals that the patient has had some confusion and hallucinations that began on 11/18/2020. Daughter has noted some gait instability. Daughter states that she feels that the patient has had some occasional slurred speech. There is been no reports of fevers, chills, chest pain, worsening shortness of breath, headache, nausea, vomiting, diarrhea, abdominal pain, hematochezia, melena. The patient had a recent prolonged hospitalization from 08/23/2020 to 09/21/2020. During that hospitalization, the patient was treated for sepsis secondary to pneumonia. She required intubation at the time of admission, but was subsequently extubated on 08/26/2020. She required reintubation on 08/30/2020 due to complete atelectasis of the left lung from mucous plugging. She was transferred to [facility] on that date and was treated until the time of discharge. Skilled nursing facility was refused by the patient's family. The patient was taken home with home health physical therapy. The patient's  discharge weight at that time was 214 pounds. Daughter states the patient has been doing fairly well since discharge with improving functional mobility and independence. The patient was using a walker to assist with ambulation. Daughter states that the patient was recently started on Namenda on 11/17/2020. No other new medications. In the emergency department, the patient was afebrile hemodynamically stable with oxygen saturation 94% on 4 L. BMP showed a sodium 139 and serum creatinine 0.74. WBC 6.0, globin 9.6, platelets 209,000. Chest x-ray showed increasing vascular congestion. UA was negative for pyuria.Neurology was contacted by ED, and felt that the patient could stay at [facility] for further evaluation.the patient was admitted for further evaluation and treatment.  Discharge Diagnoses:  Acute on chronic respiratory failure with hypoxia -Chronically on 2 L nasal cannula at home -Currently stable on 3.5 L>>2L -Secondary to pulmonary edema -11/22/20 CT chest--Slight increase in areas of patchy and bandlike opacity in the chest, also with associated air trapping. No frank consolidative changes -Wean oxygen back to baseline level -personally reviewed CXR--increased interstitial markings -PCT <0.10  Acute on chronic diastolic CHF -08/29/2020 echo EF 30 to 55%, no WMA, PASP 77.7 -Patient is on furosemide 20 mg p.o. daily at home -Continue IV furosemide>>home with lasix 20 mg po daily -She remains clinically fluid overloaded -09/22/2020 discharge weight 214 -11/20/2020 home weight221 -Daily weights--bed weights -Accurate I's and O's--incomplete  Acute metabolic encephalopathy -Multifactorial including decompensated CHF/hypoxia, possibly Namenda introduction, and progression of underlying dementia -Patient remains confused with intermittent hallucination -Urine negative for pyuria -Check TSH--0.526 -B12--5145 -Folic acid--25.7 -Minimize hypnotic drugs -11/23/20--overall  improved, near baseline -MR brain--neg for stroke  Chronic atrial fibrillation -Continue amiodarone -Continue apixaban -rate controlled  Hyperlipidemia -Continue statin  Dementia without behavioral disturbance -Continue Aricept -She is on as needed Ativan at home  Hypothyroidism -Continue Synthroid  Diabetes mellitus type 2 uncontrolled with hyperglycemia -NovoLog sliding scale -08/24/2020 hemoglobin A1c 7.9  CKD stage IIIa -Baseline creatinine 0.8-1.1 -serum creatinine 0.83 on day of d/c  Coronary artery disease -Patient had NSTEMI February 2022 -She had transient suppression of her LVEF(15-20%)on echo 08/17/2020 -Continue apixaban -personally reviewed EKG--afib, nonspecific STT change  History of stroke -Continue apixaban  Seizure disorder -She is on Keppra 1000 mg twice daily  Anemia of chronic disease -Baseline Hgb ~10 -Transfuse for hemoglobin less than 7 or if she has significant bleeding  OSA/OHS -Patient intolerant of CPAP  Morbid obesity -BMI 36.72 -Lifestyle modification  Chronic back pain -She was on as needed hydrocodone at home  Stage I sacral ulcer -Present on admission -Local wound care  Hypomagnesemia -repleted   Discharge Instructions   Allergies as of 11/24/2020      Reactions   Citalopram Hydrobromide Other (See Comments)   Dyskinesia Other reaction(s): Other (See Comments) Dyskinesia   Codeine Nausea And Vomiting, Other (See Comments)   HALLUCINATIONS Other reaction(s): Unknown Other reaction(s): Other (See Comments) HALLUCINATIONS   Hydromorphone Hcl Other (See Comments)   Made her pass out Other reaction(s): Other (See Comments) Made her pass out   Metoclopramide Other (See Comments)   DYSKINESIA Other reaction(s): Other (See Comments) DYSKINESIA   Hyoscyamine    MADE DIARRHEA WORSE      Medication List    STOP taking these medications   cephALEXin 500 MG capsule Commonly known as: KEFLEX      TAKE these medications   acetaminophen 500 MG tablet Commonly known as: TYLENOL Take 500 mg by mouth as needed for moderate pain or headache.   amiodarone 200 MG tablet Commonly known as: PACERONE TAKE 1 TABLET BY MOUTH EVERY MORNING   apixaban 5 MG Tabs tablet Commonly known as: Eliquis Take 1 tablet (5 mg total) by mouth 2 (two) times daily.   atorvastatin 80 MG tablet Commonly known as: LIPITOR Place 1 tablet (80 mg total) into feeding tube daily.   donepezil 5 MG tablet Commonly known as: ARICEPT Take 5 mg by mouth daily.   DULoxetine 30 MG capsule Commonly known as: CYMBALTA Take 30 mg by mouth 2 (two) times daily.   feeding supplement Liqd Take 237 mLs by mouth 3 (three) times daily between meals.   furosemide 20 MG tablet Commonly known as: LASIX Take 1 tablet (20 mg total) by mouth daily.   gabapentin 100 MG capsule Commonly known as: NEURONTIN Take 1 capsule (100 mg total) by mouth 3 (three) times daily.   insulin glargine 100 UNIT/ML injection Commonly known as: LANTUS Inject 0.1 mLs (10 Units total) into the skin daily. What changed: how much to take   Lactase 9000 units Chew 1 PO Q4H WHEN YOU EAT ICE CREAM OR CHEESE What changed:   how much to take  how to take this  when to take this  reasons to take this  additional instructions   levETIRAcetam 500 MG tablet Commonly known as: Keppra Take 2 tablets (1,000 mg total) by mouth 2 (two) times daily.   levothyroxine 175 MCG tablet Commonly known as: SYNTHROID Take 1 tablet by mouth daily.   loperamide 2 MG capsule Commonly known as: IMODIUM 1 PO Q4H PRN LOOSE STOOLS What changed:   how much to take  how to take this  when to take this  reasons to take this   LORazepam 0.5 MG tablet Commonly known as: ATIVAN Take 0.5 mg by mouth every 8 (eight) hours  as needed for anxiety or sedation.   meclizine 25 MG tablet Commonly known as: ANTIVERT Take 1 tablet (25 mg total) by mouth  every 8 (eight) hours as needed for dizziness.   multivitamin with minerals Tabs tablet Take 1 tablet by mouth daily.   ondansetron 4 MG tablet Commonly known as: ZOFRAN TAKE 1 TABLET BY MOUTH FOUR TIMES DAILY AS NEEDED FOR NAUSEA What changed: See the new instructions.   OXYGEN 2 L daily.   pantoprazole 40 MG tablet Commonly known as: PROTONIX TAKE ONE TABLET BY MOUTH TWICE DAILY BEFORE APPLY MEAL What changed: See the new instructions.   potassium chloride 10 MEQ tablet Commonly known as: KLOR-CON Take 20 mEq by mouth daily.   SM Melatonin 3 MG Tabs tablet Generic drug: melatonin Take 3 mg by mouth at bedtime as needed for sleep.   Vitamin D3 50 MCG (2000 UT) capsule Take 2,000 Units by mouth daily.       Allergies  Allergen Reactions  . Citalopram Hydrobromide Other (See Comments)    Dyskinesia Other reaction(s): Other (See Comments) Dyskinesia  . Codeine Nausea And Vomiting and Other (See Comments)    HALLUCINATIONS Other reaction(s): Unknown Other reaction(s): Other (See Comments) HALLUCINATIONS   . Hydromorphone Hcl Other (See Comments)    Made her pass out Other reaction(s): Other (See Comments) Made her pass out  . Metoclopramide Other (See Comments)    DYSKINESIA Other reaction(s): Other (See Comments) DYSKINESIA  . Hyoscyamine     MADE DIARRHEA WORSE    Consultations:  none   Procedures/Studies: CT Head Wo Contrast  Result Date: 11/21/2020 CLINICAL DATA:  70 year old female with altered mental status. EXAM: CT HEAD WITHOUT CONTRAST TECHNIQUE: Contiguous axial images were obtained from the base of the skull through the vertex without intravenous contrast. COMPARISON:  10/25/2020 CT and prior studies FINDINGS: Brain: No evidence of acute infarction, hemorrhage, hydrocephalus, extra-axial collection or mass lesion/mass effect. Mild atrophy and chronic small-vessel white matter ischemic changes noted. Vascular: Carotid atherosclerotic  calcifications are noted. Skull: Normal. Negative for fracture or focal lesion. Sinuses/Orbits: No acute finding. Other: None. IMPRESSION: 1. No evidence of acute intracranial abnormality. 2. Mild atrophy and chronic small-vessel white matter ischemic changes. Electronically Signed   By: Harmon Pier M.D.   On: 11/21/2020 17:28   CT Head Wo Contrast  Result Date: 10/25/2020 CLINICAL DATA:  Altered mental status EXAM: CT HEAD WITHOUT CONTRAST TECHNIQUE: Contiguous axial images were obtained from the base of the skull through the vertex without intravenous contrast. COMPARISON:  MRI brain September 08, 2020 and head CT September 06, 2020 FINDINGS: Brain: No evidence of acute large vascular territory infarction, hemorrhage, hydrocephalus, extra-axial collection or mass lesion/mass effect. Stable mild burden of chronic microvascular white matter ischemic disease. Stable age-related global parenchymal volume loss. Vascular: No hyperdense vessel. Atherosclerotic calcifications of the internal carotid arteries at skull base. Skull: Normal. Negative for fracture or focal lesion. Sinuses/Orbits: Paranasal sinuses are predominantly clear. Orbits are unremarkable. Other: Decreased now minimal bilateral mastoid effusions. IMPRESSION: 1. No acute intracranial findings. 2. Stable mild burden of chronic microvascular white matter ischemic disease and age-related global parenchymal volume loss. Electronically Signed   By: Maudry Mayhew MD   On: 10/25/2020 15:28   CT CHEST WO CONTRAST  Result Date: 11/22/2020 CLINICAL DATA:  Pneumonia, suspected effusion.  Dyspnea. EXAM: CT CHEST WITHOUT CONTRAST TECHNIQUE: Multidetector CT imaging of the chest was performed following the standard protocol without IV contrast. COMPARISON:  CT head of the  same date. FINDINGS: Cardiovascular: Calcified atheromatous plaque in the thoracic aorta. Heart size remains enlarged with signs of mitral annular calcification. No substantial pericardial effusion  again with marked cardiac enlargement. Central pulmonary vasculature with main pulmonary artery at 4 cm greatest caliber. Grossly unchanged compared to previous imaging. Mediastinum/Nodes: Thoracic inlet structures are normal. No axillary lymphadenopathy. No mediastinal lymphadenopathy with scattered reactive size lymph nodes throughout the chest. No gross hilar adenopathy on noncontrast imaging. Lungs/Pleura: Limited parenchymal assessment in the setting of respiratory motion. Slight increase in areas of volume loss and bandlike opacity in both the RIGHT and LEFT chest. Suspect air trapping with areas of mosaic attenuation as well. Airways with similar appearance, no large areas of filling defect or material in the major bronchi or in the trachea. Trace effusions as before. Upper Abdomen: Post cholecystectomy. No acute findings in the upper abdomen on limited assessment. Small hiatal hernia and mildly patulous esophagus. Musculoskeletal: Spinal degenerative changes. Cement augmentation at T12 as before. No acute or destructive bone finding. Upper thoracic compression fracture as before. IMPRESSION: 1. Slight increase in areas of patchy and bandlike opacity in the chest, also with associated air trapping. No frank consolidative changes. Findings could be related to asymmetric edema with atelectasis or ongoing infection. Correlate with any history of COVID-19 infection. 2. Trace effusions as before. 3. Marked cardiac enlargement with signs of mitral annular calcification. 4. Central pulmonary artery at 4 cm, compatible with pulmonary arterial hypertension. 5. Small hiatal hernia and mildly patulous esophagus. 6. Aortic atherosclerosis. Electronically Signed   By: Donzetta Kohut M.D.   On: 11/22/2020 11:05   CT Angio Chest PE W and/or Wo Contrast  Result Date: 10/25/2020 CLINICAL DATA:  Altered mental status. Atrial fibrillation. Pulmonary hypertension. Congestive heart failure. EXAM: CT ANGIOGRAPHY CHEST WITH  CONTRAST TECHNIQUE: Multidetector CT imaging of the chest was performed using the standard protocol during bolus administration of intravenous contrast. Multiplanar CT image reconstructions and MIPs were obtained to evaluate the vascular anatomy. CONTRAST:  OMNIPAQUE IOHEXOL 350 MG/ML SOLN COMPARISON:  08/23/2020 FINDINGS: Cardiovascular: Satisfactory opacification of the pulmonary arteries to the segmental level. No evidence of pulmonary embolism. Main pulmonary trunk is dilated at 4.0 cm. Thoracic aorta is nonaneurysmal. Atherosclerotic calcifications of the aorta. Cardiomegaly. No pericardial effusion. Mediastinum/Nodes: No enlarged mediastinal, hilar, or axillary lymph nodes. Thyroid gland, trachea, and esophagus demonstrate no significant findings. Lungs/Pleura: Trace bilateral pleural effusions. Mosaic attenuation within the right upper lobe. Band-like opacities in the right upper lobe and bibasilar regions. Dependent left basilar atelectasis. No pneumothorax. Upper Abdomen: No acute abnormality. Musculoskeletal: Chronic mild superior endplate compression deformity of T3. Prior cement augmentation of T12. No new or acute osseous findings. Mild degenerative changes throughout the thoracic spine. Review of the MIP images confirms the above findings. IMPRESSION: 1. Negative for pulmonary embolism. 2. Trace bilateral pleural effusions. 3. Mosaic attenuation within the right upper lobe, which can be seen in the setting of pulmonary edema and small airways or small vessel disease. 4. Band-like opacities in the right upper lobe and bibasilar regions suggesting atelectasis. 5. Findings of chronic cardiomegaly and pulmonary arterial hypertension. 6. Aortic atherosclerosis (ICD10-I70.0). Electronically Signed   By: Duanne Guess D.O.   On: 10/25/2020 17:32   MR BRAIN WO CONTRAST  Result Date: 11/23/2020 CLINICAL DATA:  Neuro deficit, acute stroke suspected. EXAM: MRI HEAD WITHOUT CONTRAST TECHNIQUE:  Multiplanar, multiecho pulse sequences of the brain and surrounding structures were obtained without intravenous contrast. COMPARISON:  CT head Nov 21, 2020.  MRI head September 08, 2020. FINDINGS: Significantly motion limited and incomplete examination due to patient intolerance. Within this limitation: Brain: No evidence of acute infarct, hydrocephalus, acute hemorrhage, mass lesion, midline shift, or large extra-axial fluid collection. Similar mild atrophy with ex vacuo ventricular dilation. Mild patchy T2/FLAIR hyperintensity within the white matter is suboptimally evaluated due to motion but most likely related to chronic microvascular ischemic disease. Vascular: Major arterial flow voids are maintained at the skull base. Skull and upper cervical spine: Limited evaluation with out evidence of abnormal marrow signal. Sinuses/Orbits: Mild paranasal sinus mucosal thickening without air-fluid levels. Unremarkable orbits. Other: No sizable mastoid effusions. IMPRESSION: No evidence of acute infarction, hemorrhage or mass on this significantly motion limited and incomplete exam. Electronically Signed   By: Feliberto Harts MD   On: 11/23/2020 11:23   DG Chest Port 1 View  Result Date: 11/21/2020 CLINICAL DATA:  Altered mental status EXAM: PORTABLE CHEST 1 VIEW COMPARISON:  10/25/2020 FINDINGS: Chronic cardiomegaly. Atherosclerotic calcification of the aortic knob. Mild pulmonary vascular congestion and bilateral interstitial prominence. Bandlike opacities within the perihilar and bibasilar regions. No new focal airspace consolidation. Possible trace left pleural effusion. No pneumothorax. Bones are demineralized. IMPRESSION: 1. Findings suggestive CHF with mild edema. 2. Bandlike opacities within the perihilar and bibasilar regions, likely atelectasis. Electronically Signed   By: Duanne Guess D.O.   On: 11/21/2020 17:09   DG Chest Port 1 View  Result Date: 10/25/2020 CLINICAL DATA:  Altered mental status  EXAM: PORTABLE CHEST 1 VIEW COMPARISON:  09/20/2020 FINDINGS: Stable cardiomegaly. Atherosclerotic calcification of the aortic knob. Pulmonary vascular congestion with bilateral interstitial prominence. More patchy airspace opacity within the right upper lobe and left perihilar region. No significant pleural fluid collection. No pneumothorax. IMPRESSION: Cardiomegaly with pulmonary vascular congestion and bilateral interstitial prominence. More patchy airspace opacity within the right upper lobe and left perihilar region may reflect asymmetric edema versus infiltrate. Electronically Signed   By: Duanne Guess D.O.   On: 10/25/2020 14:39   ECHOCARDIOGRAM LIMITED  Result Date: 11/22/2020    ECHOCARDIOGRAM LIMITED REPORT   Patient Name:   [name] Date of Exam: 11/22/2020 Medical Rec #:  [id number]     Height:       66.0 in Accession #:    [id number]    Weight:       227.5 lb Date of Birth:  10-29-1950     BSA:          2.112 m Patient Age:    65 years      BP:           113/62 mmHg Patient Gender: F             HR:           85 bpm. Exam Location:  Inpatient Procedure: Limited Echo, Cardiac Doppler and Limited Color Doppler Indications:    CHF-Acute Systolic I50.21  History:        Patient has prior history of Echocardiogram examinations, most                 recent 08/29/2020. CAD and Previous Myocardial Infarction,                 Pulmonary HTN and Stroke, Arrythmias:Atrial Fibrillation,                 Signs/Symptoms:Dementia; Risk Factors:Hypertension, Dyslipidemia                 and Diabetes.  Acute on chronic respiratory failure with hypoxia,                  Acute metabolic encephalopathy, Chronic kidney disease, anemia.  Sonographer:    Leta Jungling RDCS Referring Phys: [id number] OLADAPO ADEFESO IMPRESSIONS  1. Left ventricular ejection fraction, by estimation, is 55 to 60%. The left ventricle has normal function. There is moderate left ventricular hypertrophy. Left ventricular diastolic parameters  are indeterminate.  2. Right ventricular systolic function is normal. The right ventricular size is normal. There is severely elevated pulmonary artery systolic pressure.  3. Left atrial size was severely dilated.  4. Right atrial size was severely dilated.  5. Mild mitral valve regurgitation. No evidence of mitral stenosis. Moderate mitral annular calcification.  6. The tricuspid valve is abnormal. Tricuspid valve regurgitation is moderate.  7. The aortic valve is tricuspid. There is mild calcification of the aortic valve. There is mild thickening of the aortic valve. Aortic valve regurgitation is not visualized. No aortic stenosis is present.  8. Severe pulmonary HTN, PASP is 71 mmHg.  9. The inferior vena cava is dilated in size with <50% respiratory variability, suggesting right atrial pressure of 15 mmHg. 10. Limited echo FINDINGS  Left Ventricle: Left ventricular ejection fraction, by estimation, is 55 to 60%. The left ventricle has normal function. There is moderate left ventricular hypertrophy. Left ventricular diastolic parameters are indeterminate. Right Ventricle: The right ventricular size is normal. No increase in right ventricular wall thickness. Right ventricular systolic function is normal. There is severely elevated pulmonary artery systolic pressure. The tricuspid regurgitant velocity is 3.75 m/s, and with an assumed right atrial pressure of 15 mmHg, the estimated right ventricular systolic pressure is 71.2 mmHg. Left Atrium: Left atrial size was severely dilated. Right Atrium: Right atrial size was severely dilated. Mitral Valve: There is mild thickening of the mitral valve leaflet(s). There is mild calcification of the mitral valve leaflet(s). Moderate mitral annular calcification. Mild mitral valve regurgitation. No evidence of mitral valve stenosis. Tricuspid Valve: The tricuspid valve is abnormal. Tricuspid valve regurgitation is moderate . No evidence of tricuspid stenosis. Aortic Valve: The  aortic valve is tricuspid. There is mild calcification of the aortic valve. There is mild thickening of the aortic valve. There is mild aortic valve annular calcification. Aortic valve regurgitation is not visualized. No aortic stenosis  is present. Pulmonic Valve: The pulmonic valve was not well visualized. Pulmonic valve regurgitation is not visualized. No evidence of pulmonic stenosis. Pulmonary Artery: Severe pulmonary HTN, PASP is 71 mmHg. Venous: The inferior vena cava is dilated in size with less than 50% respiratory variability, suggesting right atrial pressure of 15 mmHg. LEFT VENTRICLE PLAX 2D LVIDd:         5.02 cm      Diastology LVIDs:         3.99 cm      LV e' medial:    7.45 cm/s LV PW:         1.32 cm      LV E/e' medial:  20.6 LV IVS:        1.46 cm      LV e' lateral:   8.82 cm/s LVOT diam:     2.00 cm      LV E/e' lateral: 17.4 LV SV:         56 LV SV Index:   27 LVOT Area:     3.14 cm  LV Volumes (MOD) LV vol d, MOD A2C: 111.0 ml  LV vol d, MOD A4C: 83.5 ml LV vol s, MOD A2C: 59.2 ml LV vol s, MOD A4C: 36.8 ml LV SV MOD A2C:     51.8 ml LV SV MOD A4C:     83.5 ml LV SV MOD BP:      50.3 ml LEFT ATRIUM              Index LA Vol (A2C):   109.0 ml 51.60 ml/m LA Vol (A4C):   83.1 ml  39.34 ml/m LA Biplane Vol: 95.8 ml  45.35 ml/m  AORTIC VALVE LVOT Vmax:   108.63 cm/s LVOT Vmean:  75.033 cm/s LVOT VTI:    0.179 m MITRAL VALVE                TRICUSPID VALVE MV Area (PHT): 5.07 cm     TR Peak grad:   56.2 mmHg MV Decel Time: 150 msec     TR Vmax:        375.00 cm/s MV E velocity: 153.67 cm/s                             SHUNTS                             Systemic VTI:  0.18 m                             Systemic Diam: 2.00 cm Dina Rich MD Electronically signed by Dina Rich MD Signature Date/Time: 11/22/2020/2:03:14 PM    Final          Discharge Exam: Vitals:   11/24/20 0505 11/24/20 0820  BP: (!) 139/57 133/64  Pulse: 94 98  Resp: 18   Temp: 98 F (36.7 C) 98.4 F (36.9  C)  SpO2: 93% 90%   Vitals:   11/23/20 1847 11/23/20 2136 11/24/20 0505 11/24/20 0820  BP: 107/71 106/61 (!) 139/57 133/64  Pulse: 96 79 94 98  Resp:  18 18   Temp:  98.4 F (36.9 C) 98 F (36.7 C) 98.4 F (36.9 C)  TempSrc:  Oral Oral Oral  SpO2: 93% 93% 93% 90%  Weight:        General: Pt is alert, awake, not in acute distress Cardiovascular: IRRR, S1/S2 +, no rubs, no gallops Respiratory: CTA bilaterally, no wheezing, no rhonchi Abdominal: Soft, NT, ND, bowel sounds + Extremities: no edema, no cyanosis   The results of significant diagnostics from this hospitalization (including imaging, microbiology, ancillary and laboratory) are listed below for reference.    Significant Diagnostic Studies: CT Head Wo Contrast  Result Date: 11/21/2020 CLINICAL DATA:  70 year old female with altered mental status. EXAM: CT HEAD WITHOUT CONTRAST TECHNIQUE: Contiguous axial images were obtained from the base of the skull through the vertex without intravenous contrast. COMPARISON:  10/25/2020 CT and prior studies FINDINGS: Brain: No evidence of acute infarction, hemorrhage, hydrocephalus, extra-axial collection or mass lesion/mass effect. Mild atrophy and chronic small-vessel white matter ischemic changes noted. Vascular: Carotid atherosclerotic calcifications are noted. Skull: Normal. Negative for fracture or focal lesion. Sinuses/Orbits: No acute finding. Other: None. IMPRESSION: 1. No evidence of acute intracranial abnormality. 2. Mild atrophy and chronic small-vessel white matter ischemic changes. Electronically Signed   By: Harmon Pier M.D.   On: 11/21/2020 17:28   CT Head Wo Contrast  Result Date: 10/25/2020 CLINICAL DATA:  Altered mental status EXAM: CT HEAD WITHOUT CONTRAST TECHNIQUE: Contiguous axial images were obtained from the base of the skull through the vertex without intravenous contrast. COMPARISON:  MRI brain September 08, 2020 and head CT September 06, 2020 FINDINGS: Brain: No evidence of  acute large vascular territory infarction, hemorrhage, hydrocephalus, extra-axial collection or mass lesion/mass effect. Stable mild burden of chronic microvascular white matter ischemic disease. Stable age-related global parenchymal volume loss. Vascular: No hyperdense vessel. Atherosclerotic calcifications of the internal carotid arteries at skull base. Skull: Normal. Negative for fracture or focal lesion. Sinuses/Orbits: Paranasal sinuses are predominantly clear. Orbits are unremarkable. Other: Decreased now minimal bilateral mastoid effusions. IMPRESSION: 1. No acute intracranial findings. 2. Stable mild burden of chronic microvascular white matter ischemic disease and age-related global parenchymal volume loss. Electronically Signed   By: Maudry Mayhew MD   On: 10/25/2020 15:28   CT CHEST WO CONTRAST  Result Date: 11/22/2020 CLINICAL DATA:  Pneumonia, suspected effusion.  Dyspnea. EXAM: CT CHEST WITHOUT CONTRAST TECHNIQUE: Multidetector CT imaging of the chest was performed following the standard protocol without IV contrast. COMPARISON:  CT head of the same date. FINDINGS: Cardiovascular: Calcified atheromatous plaque in the thoracic aorta. Heart size remains enlarged with signs of mitral annular calcification. No substantial pericardial effusion again with marked cardiac enlargement. Central pulmonary vasculature with main pulmonary artery at 4 cm greatest caliber. Grossly unchanged compared to previous imaging. Mediastinum/Nodes: Thoracic inlet structures are normal. No axillary lymphadenopathy. No mediastinal lymphadenopathy with scattered reactive size lymph nodes throughout the chest. No gross hilar adenopathy on noncontrast imaging. Lungs/Pleura: Limited parenchymal assessment in the setting of respiratory motion. Slight increase in areas of volume loss and bandlike opacity in both the RIGHT and LEFT chest. Suspect air trapping with areas of mosaic attenuation as well. Airways with similar  appearance, no large areas of filling defect or material in the major bronchi or in the trachea. Trace effusions as before. Upper Abdomen: Post cholecystectomy. No acute findings in the upper abdomen on limited assessment. Small hiatal hernia and mildly patulous esophagus. Musculoskeletal: Spinal degenerative changes. Cement augmentation at T12 as before. No acute or destructive bone finding. Upper thoracic compression fracture as before. IMPRESSION: 1. Slight increase in areas of patchy and bandlike opacity in the chest, also with associated air trapping. No frank consolidative changes. Findings could be related to asymmetric edema with atelectasis or ongoing infection. Correlate with any history of COVID-19 infection. 2. Trace effusions as before. 3. Marked cardiac enlargement with signs of mitral annular calcification. 4. Central pulmonary artery at 4 cm, compatible with pulmonary arterial hypertension. 5. Small hiatal hernia and mildly patulous esophagus. 6. Aortic atherosclerosis. Electronically Signed   By: Donzetta Kohut M.D.   On: 11/22/2020 11:05   CT Angio Chest PE W and/or Wo Contrast  Result Date: 10/25/2020 CLINICAL DATA:  Altered mental status. Atrial fibrillation. Pulmonary hypertension. Congestive heart failure. EXAM: CT ANGIOGRAPHY CHEST WITH CONTRAST TECHNIQUE: Multidetector CT imaging of the chest was performed using the standard protocol during bolus administration of intravenous contrast. Multiplanar CT image reconstructions and MIPs were obtained to evaluate the vascular anatomy. CONTRAST:  OMNIPAQUE IOHEXOL 350 MG/ML SOLN COMPARISON:  08/23/2020 FINDINGS: Cardiovascular: Satisfactory opacification of the pulmonary arteries to the segmental level. No evidence of pulmonary embolism. Main pulmonary trunk is dilated at 4.0 cm. Thoracic aorta is nonaneurysmal. Atherosclerotic calcifications of the aorta. Cardiomegaly. No pericardial effusion. Mediastinum/Nodes: No enlarged mediastinal,  hilar, or axillary lymph nodes. Thyroid gland, trachea, and esophagus demonstrate no  significant findings. Lungs/Pleura: Trace bilateral pleural effusions. Mosaic attenuation within the right upper lobe. Band-like opacities in the right upper lobe and bibasilar regions. Dependent left basilar atelectasis. No pneumothorax. Upper Abdomen: No acute abnormality. Musculoskeletal: Chronic mild superior endplate compression deformity of T3. Prior cement augmentation of T12. No new or acute osseous findings. Mild degenerative changes throughout the thoracic spine. Review of the MIP images confirms the above findings. IMPRESSION: 1. Negative for pulmonary embolism. 2. Trace bilateral pleural effusions. 3. Mosaic attenuation within the right upper lobe, which can be seen in the setting of pulmonary edema and small airways or small vessel disease. 4. Band-like opacities in the right upper lobe and bibasilar regions suggesting atelectasis. 5. Findings of chronic cardiomegaly and pulmonary arterial hypertension. 6. Aortic atherosclerosis (ICD10-I70.0). Electronically Signed   By: Duanne Guess D.O.   On: 10/25/2020 17:32   MR BRAIN WO CONTRAST  Result Date: 11/23/2020 CLINICAL DATA:  Neuro deficit, acute stroke suspected. EXAM: MRI HEAD WITHOUT CONTRAST TECHNIQUE: Multiplanar, multiecho pulse sequences of the brain and surrounding structures were obtained without intravenous contrast. COMPARISON:  CT head Nov 21, 2020.  MRI head September 08, 2020. FINDINGS: Significantly motion limited and incomplete examination due to patient intolerance. Within this limitation: Brain: No evidence of acute infarct, hydrocephalus, acute hemorrhage, mass lesion, midline shift, or large extra-axial fluid collection. Similar mild atrophy with ex vacuo ventricular dilation. Mild patchy T2/FLAIR hyperintensity within the white matter is suboptimally evaluated due to motion but most likely related to chronic microvascular ischemic disease.  Vascular: Major arterial flow voids are maintained at the skull base. Skull and upper cervical spine: Limited evaluation with out evidence of abnormal marrow signal. Sinuses/Orbits: Mild paranasal sinus mucosal thickening without air-fluid levels. Unremarkable orbits. Other: No sizable mastoid effusions. IMPRESSION: No evidence of acute infarction, hemorrhage or mass on this significantly motion limited and incomplete exam. Electronically Signed   By: Feliberto Harts MD   On: 11/23/2020 11:23   DG Chest Port 1 View  Result Date: 11/21/2020 CLINICAL DATA:  Altered mental status EXAM: PORTABLE CHEST 1 VIEW COMPARISON:  10/25/2020 FINDINGS: Chronic cardiomegaly. Atherosclerotic calcification of the aortic knob. Mild pulmonary vascular congestion and bilateral interstitial prominence. Bandlike opacities within the perihilar and bibasilar regions. No new focal airspace consolidation. Possible trace left pleural effusion. No pneumothorax. Bones are demineralized. IMPRESSION: 1. Findings suggestive CHF with mild edema. 2. Bandlike opacities within the perihilar and bibasilar regions, likely atelectasis. Electronically Signed   By: Duanne Guess D.O.   On: 11/21/2020 17:09   DG Chest Port 1 View  Result Date: 10/25/2020 CLINICAL DATA:  Altered mental status EXAM: PORTABLE CHEST 1 VIEW COMPARISON:  09/20/2020 FINDINGS: Stable cardiomegaly. Atherosclerotic calcification of the aortic knob. Pulmonary vascular congestion with bilateral interstitial prominence. More patchy airspace opacity within the right upper lobe and left perihilar region. No significant pleural fluid collection. No pneumothorax. IMPRESSION: Cardiomegaly with pulmonary vascular congestion and bilateral interstitial prominence. More patchy airspace opacity within the right upper lobe and left perihilar region may reflect asymmetric edema versus infiltrate. Electronically Signed   By: Duanne Guess D.O.   On: 10/25/2020 14:39   ECHOCARDIOGRAM  LIMITED  Result Date: 11/22/2020    ECHOCARDIOGRAM LIMITED REPORT   Patient Name:   [name] Date of Exam: 11/22/2020 Medical Rec #:  [id number]     Height:       66.0 in Accession #:    [id number]    Weight:       227.5  lb Date of Birth:  07-23-1950     BSA:          2.112 m Patient Age:    [age] years      BP:           113/62 mmHg Patient Gender: F             HR:           85 bpm. Exam Location:  Inpatient Procedure: Limited Echo, Cardiac Doppler and Limited Color Doppler Indications:    CHF-Acute Systolic I50.21  History:        Patient has prior history of Echocardiogram examinations, most                 recent 08/29/2020. CAD and Previous Myocardial Infarction,                 Pulmonary HTN and Stroke, Arrythmias:Atrial Fibrillation,                 Signs/Symptoms:Dementia; Risk Factors:Hypertension, Dyslipidemia                 and Diabetes. Acute on chronic respiratory failure with hypoxia,                  Acute metabolic encephalopathy, Chronic kidney disease, anemia.  Sonographer:    Leta Junglingiffany Cooper RDCS Referring Phys: [id number] OLADAPO ADEFESO IMPRESSIONS  1. Left ventricular ejection fraction, by estimation, is 55 to 60%. The left ventricle has normal function. There is moderate left ventricular hypertrophy. Left ventricular diastolic parameters are indeterminate.  2. Right ventricular systolic function is normal. The right ventricular size is normal. There is severely elevated pulmonary artery systolic pressure.  3. Left atrial size was severely dilated.  4. Right atrial size was severely dilated.  5. Mild mitral valve regurgitation. No evidence of mitral stenosis. Moderate mitral annular calcification.  6. The tricuspid valve is abnormal. Tricuspid valve regurgitation is moderate.  7. The aortic valve is tricuspid. There is mild calcification of the aortic valve. There is mild thickening of the aortic valve. Aortic valve regurgitation is not visualized. No aortic stenosis is present.  8. Severe  pulmonary HTN, PASP is 71 mmHg.  9. The inferior vena cava is dilated in size with <50% respiratory variability, suggesting right atrial pressure of 15 mmHg. 10. Limited echo FINDINGS  Left Ventricle: Left ventricular ejection fraction, by estimation, is 55 to 60%. The left ventricle has normal function. There is moderate left ventricular hypertrophy. Left ventricular diastolic parameters are indeterminate. Right Ventricle: The right ventricular size is normal. No increase in right ventricular wall thickness. Right ventricular systolic function is normal. There is severely elevated pulmonary artery systolic pressure. The tricuspid regurgitant velocity is 3.75 m/s, and with an assumed right atrial pressure of 15 mmHg, the estimated right ventricular systolic pressure is 71.2 mmHg. Left Atrium: Left atrial size was severely dilated. Right Atrium: Right atrial size was severely dilated. Mitral Valve: There is mild thickening of the mitral valve leaflet(s). There is mild calcification of the mitral valve leaflet(s). Moderate mitral annular calcification. Mild mitral valve regurgitation. No evidence of mitral valve stenosis. Tricuspid Valve: The tricuspid valve is abnormal. Tricuspid valve regurgitation is moderate . No evidence of tricuspid stenosis. Aortic Valve: The aortic valve is tricuspid. There is mild calcification of the aortic valve. There is mild thickening of the aortic valve. There is mild aortic valve annular calcification. Aortic valve regurgitation is not visualized. No aortic stenosis  is present.  Pulmonic Valve: The pulmonic valve was not well visualized. Pulmonic valve regurgitation is not visualized. No evidence of pulmonic stenosis. Pulmonary Artery: Severe pulmonary HTN, PASP is 71 mmHg. Venous: The inferior vena cava is dilated in size with less than 50% respiratory variability, suggesting right atrial pressure of 15 mmHg. LEFT VENTRICLE PLAX 2D LVIDd:         5.02 cm      Diastology LVIDs:          3.99 cm      LV e' medial:    7.45 cm/s LV PW:         1.32 cm      LV E/e' medial:  20.6 LV IVS:        1.46 cm      LV e' lateral:   8.82 cm/s LVOT diam:     2.00 cm      LV E/e' lateral: 17.4 LV SV:         56 LV SV Index:   27 LVOT Area:     3.14 cm  LV Volumes (MOD) LV vol d, MOD A2C: 111.0 ml LV vol d, MOD A4C: 83.5 ml LV vol s, MOD A2C: 59.2 ml LV vol s, MOD A4C: 36.8 ml LV SV MOD A2C:     51.8 ml LV SV MOD A4C:     83.5 ml LV SV MOD BP:      50.3 ml LEFT ATRIUM              Index LA Vol (A2C):   109.0 ml 51.60 ml/m LA Vol (A4C):   83.1 ml  39.34 ml/m LA Biplane Vol: 95.8 ml  45.35 ml/m  AORTIC VALVE LVOT Vmax:   108.63 cm/s LVOT Vmean:  75.033 cm/s LVOT VTI:    0.179 m MITRAL VALVE                TRICUSPID VALVE MV Area (PHT): 5.07 cm     TR Peak grad:   56.2 mmHg MV Decel Time: 150 msec     TR Vmax:        375.00 cm/s MV E velocity: 153.67 cm/s                             SHUNTS                             Systemic VTI:  0.18 m                             Systemic Diam: 2.00 cm Dina Rich MD Electronically signed by Dina Rich MD Signature Date/Time: 11/22/2020/2:03:14 PM    Final      Microbiology: Recent Results (from the past 240 hour(s))  SARS CORONAVIRUS 2 (Bryley Kovacevic 6-24 HRS) Nasopharyngeal Nasopharyngeal Swab     Status: None   Collection Time: 11/21/20  8:48 PM   Specimen: Nasopharyngeal Swab  Result Value Ref Range Status   SARS Coronavirus 2 NEGATIVE NEGATIVE Final    Comment: (NOTE) SARS-CoV-2 target nucleic acids are NOT DETECTED.  The SARS-CoV-2 RNA is generally detectable in upper and lower respiratory specimens during the acute phase of infection. Negative results do not preclude SARS-CoV-2 infection, do not rule out co-infections with other pathogens, and should not be used as the sole basis for treatment or other patient management decisions.  Negative results must be combined with clinical observations, patient history, and epidemiological information. The  expected result is Negative.  Fact Sheet for Patients: HairSlick.no  Fact Sheet for Healthcare Providers: quierodirigir.com  This test is not yet approved or cleared by the [place] FDA and  has been authorized for detection and/or diagnosis of SARS-CoV-2 by FDA under an Emergency Use Authorization (EUA). This EUA will remain  in effect (meaning this test can be used) for the duration of the COVID-19 declaration under Se ction 564(b)(1) of the Act, 21 U.S.C. section 360bbb-3(b)(1), unless the authorization is terminated or revoked sooner.  Performed at [facility], [address] [place], [place] [address]      Labs: Basic Metabolic Panel: Recent Labs  Lab 11/21/20 1656 11/22/20 0403 11/23/20 0424 11/24/20 0500  NA 139 139 139 140  K 4.7 4.0 3.9 3.3*  CL 100 97* 94* 94*  CO2 33* 32 32 35*  GLUCOSE 180* 168* 229* 198*  BUN 13 12 13 12   CREATININE 0.74 0.73 0.83 0.83  CALCIUM 9.1 9.1 9.2 9.2  MG  --  1.6* 1.8 2.0  PHOS  --  4.0  --   --    Liver Function Tests: Recent Labs  Lab 11/21/20 1656 11/22/20 0403 11/23/20 0424  AST 14* 16 19  ALT 13 13 13   ALKPHOS 56 56 57  BILITOT 0.4 0.7 0.8  PROT 6.3* 6.3* 6.4*  ALBUMIN 3.3* 3.1* 3.3*   No results for input(s): LIPASE, AMYLASE in the last 168 hours. No results for input(s): AMMONIA in the last 168 hours. CBC: Recent Labs  Lab 11/21/20 1656 11/22/20 0403 11/23/20 0424  WBC 6.0 6.5 7.8  NEUTROABS 3.8  --   --   HGB 9.6* 9.2* 9.7*  HCT 32.5* 31.2* 31.8*  MCV 102.8* 100.0 99.1  PLT 209 201 222   Cardiac Enzymes: No results for input(s): CKTOTAL, CKMB, CKMBINDEX, TROPONINI in the last 168 hours. BNP: Invalid input(s): POCBNP CBG: Recent Labs  Lab 11/23/20 0717 11/23/20 1104 11/23/20 1614 11/23/20 2132 11/24/20 0845  GLUCAP 229* 149* 191* 194* 186*    Time coordinating discharge:  36 minutes  Signed:  11/25/20, DO [facility] Pager: [phone] 11/24/2020, 11:27 AM

## 2020-11-24 NOTE — TOC Transition Note (Signed)
Transition of Care [facility]) - CM/SW Discharge Note   Patient Details  Name: [name] MRN: [id number] Date of Birth: [birth year]  [facility] CM/SW Contact:  Leitha Bleak, RN Phone Number: 11/24/2020, 12:35 PM   Clinical Narrative:   Patient admitted with Altered mental status, fluid overload, CHF. Patient has a high risk for readmission. Last admission patient received new DME orders. TOC unable to get home health services in the past. No offers this admission. TOC placed order for [facility] to contact patient.  Daughter at the bedside. RN to provide patient with Living better with CHF book to education patient and daughter.  Daughter can transport patient when ready for discharge.     Final next level of care: Home/Self Care Barriers to Discharge: Barriers Resolved  Patient Goals and CMS Choice Patient states their goals for this hospitalization and ongoing recovery are:: to go home. CMS Medicare.gov Compare Post Acute Care list provided to:: Patient    Discharge Placement             Patient to be transferred to facility by: Daughter   Patient and family notified of of transfer: 11/24/20  Discharge Plan and Services           Readmission Risk Interventions Readmission Risk Prevention Plan 11/24/2020 09/21/2020 09/16/2020  Transportation Screening Complete Complete Complete  HRI or Home Care Consult Complete - -  Social Work Consult for Recovery Care Planning/Counseling Complete - -  Palliative Care Screening Not Applicable - -  Medication Review Oceanographer) Complete Complete Complete  PCP or Specialist appointment within 3-5 days of discharge - Complete Complete  HRI or Home Care Consult - Complete Complete  SW Recovery Care/Counseling Consult - Complete Complete  Palliative Care Screening - Complete Complete  Skilled Nursing Facility - Complete Complete  Some recent data might be hidden

## 2020-12-03 DIAGNOSIS — R269 Unspecified abnormalities of gait and mobility: Secondary | ICD-10-CM | POA: Diagnosis not present

## 2020-12-03 DIAGNOSIS — I5032 Chronic diastolic (congestive) heart failure: Secondary | ICD-10-CM | POA: Diagnosis not present

## 2020-12-05 DIAGNOSIS — I482 Chronic atrial fibrillation, unspecified: Secondary | ICD-10-CM | POA: Diagnosis not present

## 2020-12-05 DIAGNOSIS — I5020 Unspecified systolic (congestive) heart failure: Secondary | ICD-10-CM | POA: Diagnosis not present

## 2020-12-05 DIAGNOSIS — J181 Lobar pneumonia, unspecified organism: Secondary | ICD-10-CM | POA: Diagnosis not present

## 2020-12-05 DIAGNOSIS — J9620 Acute and chronic respiratory failure, unspecified whether with hypoxia or hypercapnia: Secondary | ICD-10-CM | POA: Diagnosis not present

## 2020-12-08 ENCOUNTER — Telehealth: Payer: Self-pay | Admitting: Cardiology

## 2020-12-08 NOTE — Telephone Encounter (Signed)
New message    Pt c/o swelling: STAT is pt has developed SOB within 24 hours  1. If swelling, where is the swelling located? In abdomin , its tight , and poor circulation in her legs   2. How much weight have you gained and in what time span? 209--225 since April   3. Have you gained 3 pounds in a day or 5 pounds in a week? no  4. Do you have a log of your daily weights (if so, list)? Yes   5. Are you currently taking a fluid pill? Yes   6. Are you currently SOB? Coughing and more fatigued  Have you traveled recently? No  She doesn't think the fluid pill is working properly and wants to know if they can go back to the old fluid pill

## 2020-12-08 NOTE — Telephone Encounter (Signed)
Daughter states mothers weight at hospital discharge on 11/24/20 was 209 lbs. She is currently 225 lbs at home. She takes Lasix 20 mg daily. Daughter felt she did better on Torsemide. She admits to giving her mother a couple days of Lasix 40 mg previously. She  states mother is in no distress but she wants to get her medication addressed.   I will forward to Dr.Branch.

## 2020-12-09 NOTE — Telephone Encounter (Signed)
Daughter called stating that her mother  has gained another pound today .

## 2020-12-11 DIAGNOSIS — G309 Alzheimer's disease, unspecified: Secondary | ICD-10-CM | POA: Diagnosis not present

## 2020-12-11 DIAGNOSIS — R413 Other amnesia: Secondary | ICD-10-CM | POA: Diagnosis not present

## 2020-12-11 DIAGNOSIS — M130 Polyarthritis, unspecified: Secondary | ICD-10-CM | POA: Diagnosis not present

## 2020-12-11 DIAGNOSIS — M25569 Pain in unspecified knee: Secondary | ICD-10-CM | POA: Diagnosis not present

## 2020-12-11 DIAGNOSIS — G629 Polyneuropathy, unspecified: Secondary | ICD-10-CM | POA: Diagnosis not present

## 2020-12-11 DIAGNOSIS — M545 Low back pain, unspecified: Secondary | ICD-10-CM | POA: Diagnosis not present

## 2020-12-11 DIAGNOSIS — R569 Unspecified convulsions: Secondary | ICD-10-CM | POA: Diagnosis not present

## 2020-12-11 DIAGNOSIS — M797 Fibromyalgia: Secondary | ICD-10-CM | POA: Diagnosis not present

## 2020-12-11 IMAGING — MR MR LUMBAR SPINE W/O CM
4 of 6 series · 14 of 48 positions shown · non-contrast
Comparison: Plain films lumbar spine 07/02/2018. MRI lumbar spine
08/22/2008. CT abdomen and pelvis 08/25/2017.

CLINICAL DATA: Low back and bilateral lower extremity pain since a
fall in September 2017. History of lumbar surgery and T12 kyphoplasty.

EXAM:
MRI LUMBAR SPINE WITHOUT CONTRAST
TECHNIQUE: Multiplanar, multisequence MR imaging of the lumbar spine was
performed. No intravenous contrast was administered.

[Series 3: T2 · sagittal · 4.0mm · 0.71mm/px · 5 of 17 slices shown (1 of 3)]
[im 1/17]
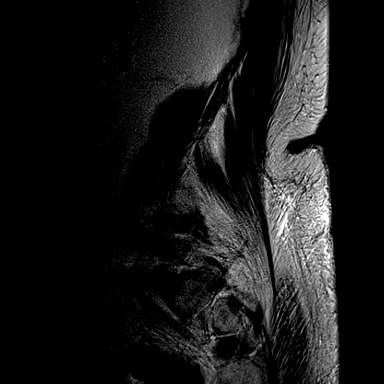
[im 5/17]
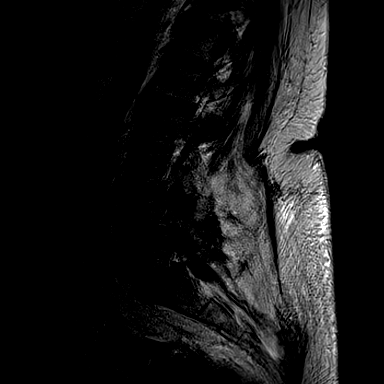
[im 9/17]
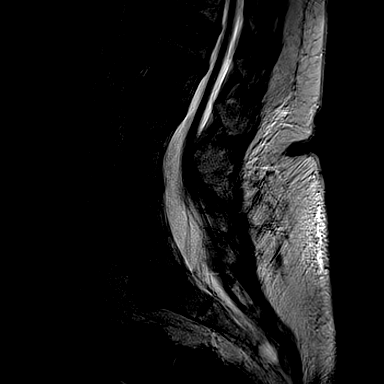
[im 13/17]
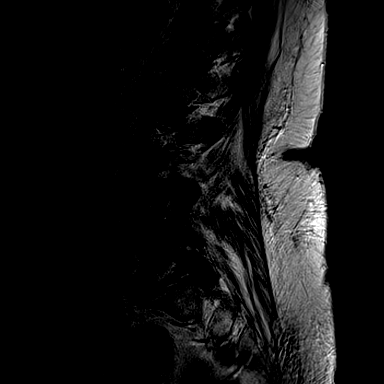
[im 17/17]
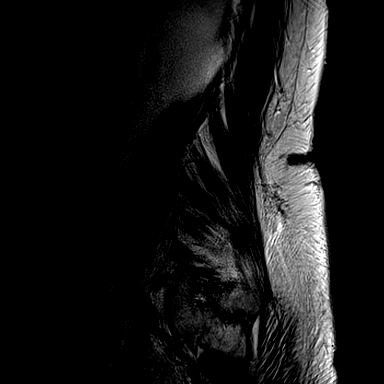

[Series 4: T1 · sagittal · 4.0mm · 0.36mm/px · 3 of 17 slices shown]
[im 1/17]
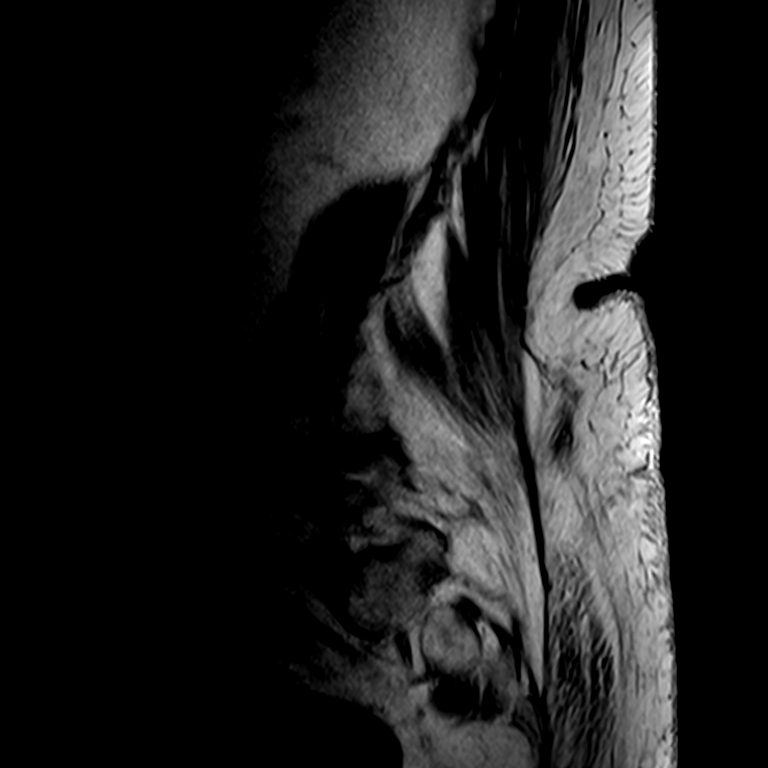
[im 9/17]
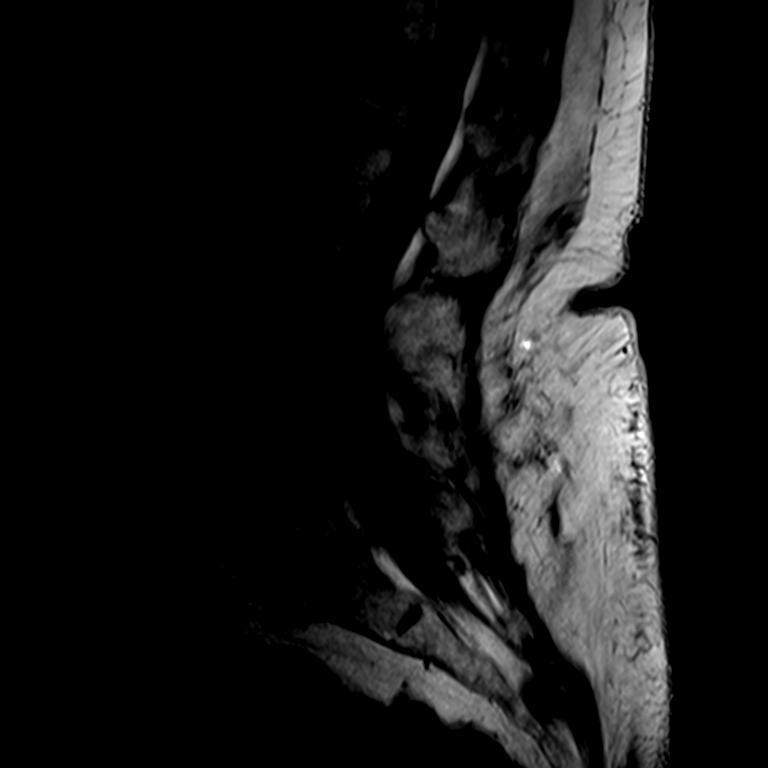
[im 17/17]
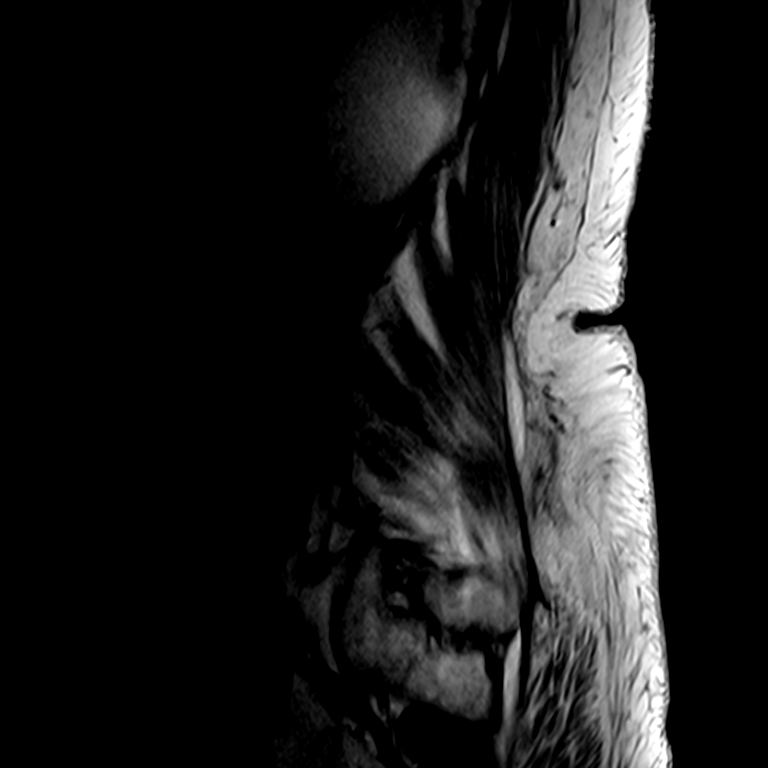

[Series 6: T2 · axial · 4.0mm · 0.31mm/px · z∈[-18,+141]mm · 3 of 38 slices shown (2 of 3)]
[im 4/38]
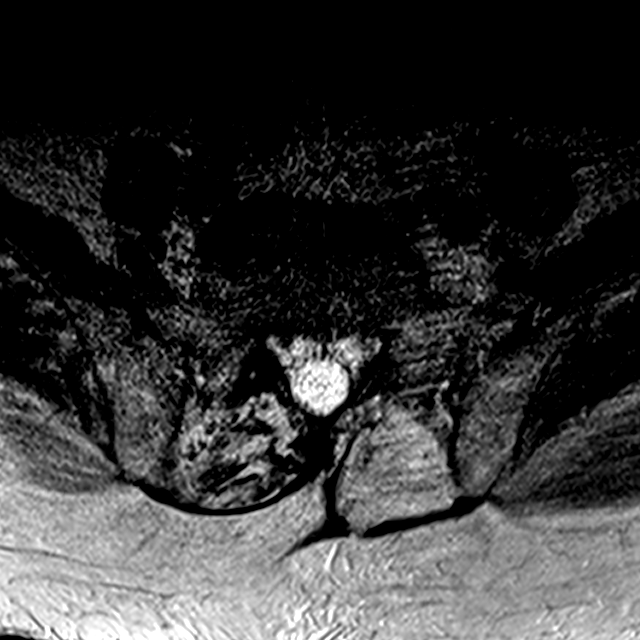
[im 19/38]
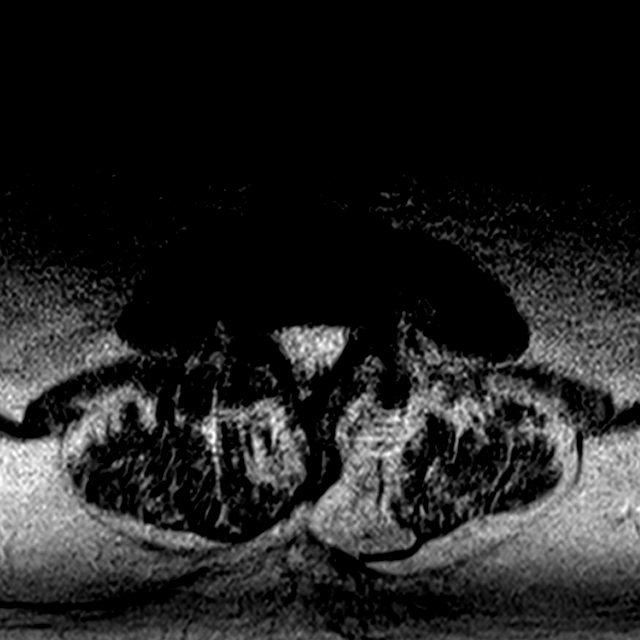
[im 34/38]
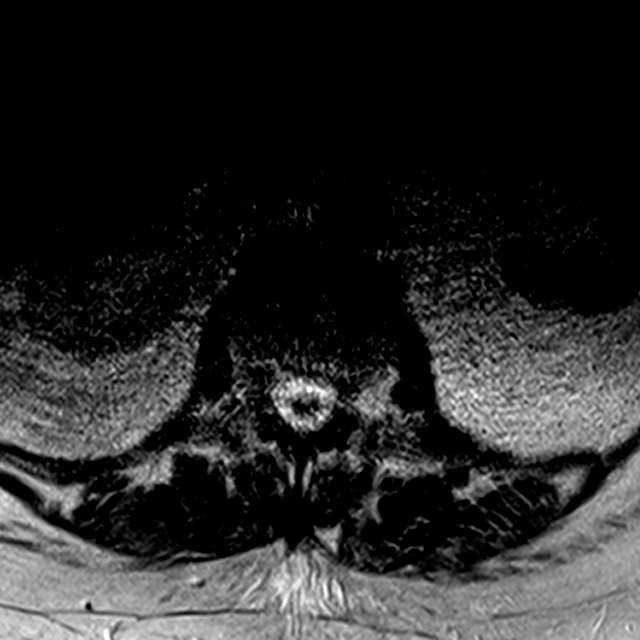

[Series 8: T2 · axial · 4.0mm · 0.31mm/px · z∈[-18,+141]mm · 3 of 38 slices shown (3 of 3)]
[im 4/38]
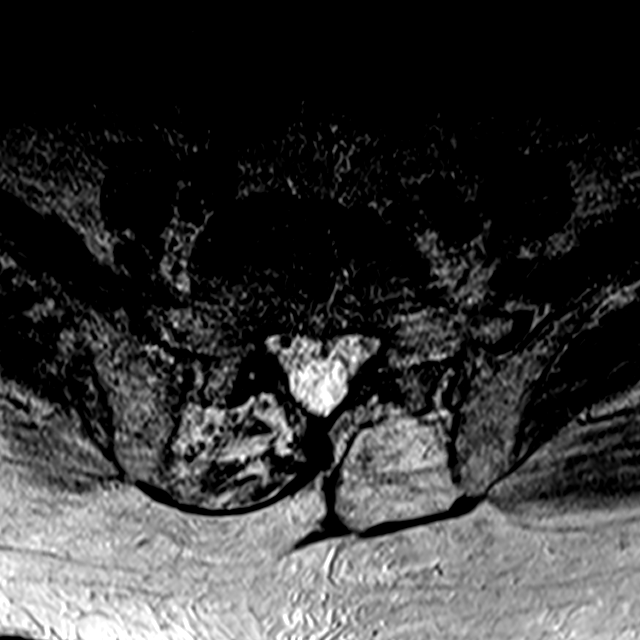
[im 19/38]
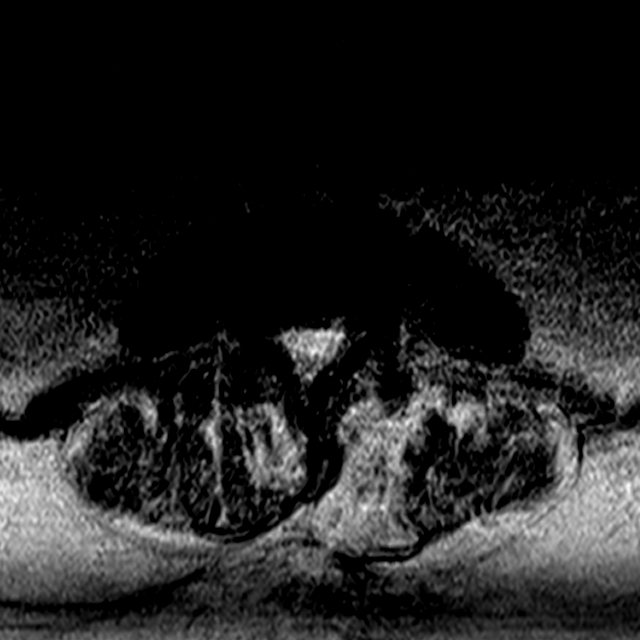
[im 34/38]
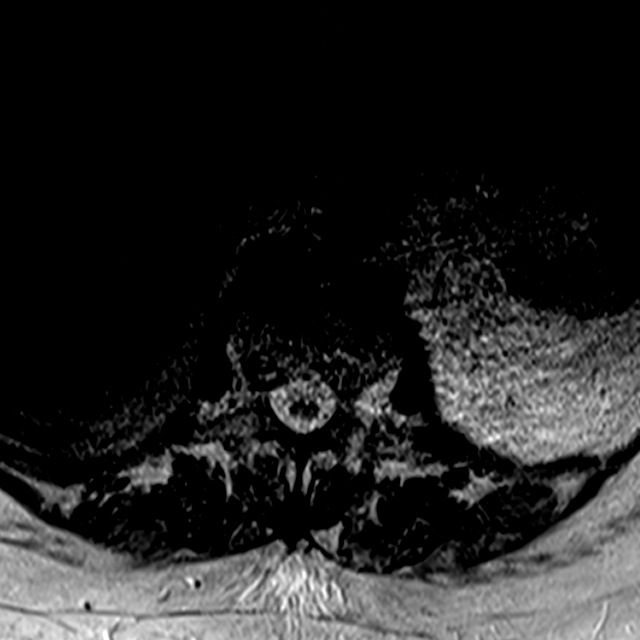

[14 of 48 positions shown; findings below may reference images not displayed]

FINDINGS: Segmentation: Congenital L2-3 fusion is noted as seen on prior
examinations.

Alignment:  There is mild convex left scoliosis.  No listhesis.

Vertebrae: No acute abnormality or worrisome lesion. The patient is
status post kyphoplasty at T12. Schmorl's node in the inferior
endplate of T11 is noted.

Conus medullaris and cauda equina: Conus extends to the L5-S1 level
consistent with a tethered cord. The cord is divided into 2 segments
from L1 to approximately L3 consistent with diastematomyelia.

Paraspinal and other soft tissues: Negative.

Disc levels:

Evaluation is limited by patient motion despite repeating sequences.
T11-12 and T12-L1 are imaged in the sagittal plane only. There is
shallow disc bulging at both levels and minimal retropulsion off the
superior endplate of T12-L1 but the central canal and foramina
appear open.

L1-2: Minimal disc bulge without stenosis.

L2-3: Negative.

L3-4: There is a shallow disc bulge and some facet degenerative
disease. The central canal is open. Short pedicle length at this
level results moderate bilateral foraminal narrowing, unchanged.

L4-5: Loss of disc space height. Dysplastic posterior elements on
the left are identified. The patient is status post left laminotomy.
The central canal is open. Moderate bilateral foraminal narrowing is
identified. The appearance is unchanged.

L5-S1: There is a shallow disc bulge and some facet degenerative
disease. No central canal stenosis. The left foramen is open.
Moderate right foraminal narrowing has progressed since the prior
exam.
IMPRESSION: Tethered cord and diastematomyelia as seen on the prior MRI.

Congenital L2-3 fusion as seen on the prior MRI.

Moderate right foraminal narrowing at L5-S1 appears worse than on
the prior MRI.

No change in moderate bilateral foraminal narrowing at L3-4 and
L4-5.

Negative for central canal stenosis.

## 2020-12-11 NOTE — Telephone Encounter (Signed)
I spoke with daughter and she report mother had already lost 3 lbs yesterday. Patient has an appointnment outside the home today and they will start extra lasix tomorrow and cal [place] back next week.

## 2020-12-14 DIAGNOSIS — I5032 Chronic diastolic (congestive) heart failure: Secondary | ICD-10-CM | POA: Diagnosis not present

## 2020-12-14 DIAGNOSIS — E1140 Type 2 diabetes mellitus with diabetic neuropathy, unspecified: Secondary | ICD-10-CM | POA: Diagnosis not present

## 2020-12-14 DIAGNOSIS — F0390 Unspecified dementia without behavioral disturbance: Secondary | ICD-10-CM | POA: Diagnosis not present

## 2020-12-14 DIAGNOSIS — E039 Hypothyroidism, unspecified: Secondary | ICD-10-CM | POA: Diagnosis not present

## 2020-12-21 ENCOUNTER — Inpatient Hospital Stay (HOSPITAL_COMMUNITY): Payer: PPO

## 2020-12-21 ENCOUNTER — Inpatient Hospital Stay (HOSPITAL_COMMUNITY)
Admission: EM | Admit: 2020-12-21 | Discharge: 2020-12-30 | DRG: 208 | Disposition: A | Discharge status: Home Health | Payer: PPO | Attending: Internal Medicine | Admitting: Internal Medicine

## 2020-12-21 ENCOUNTER — Encounter (HOSPITAL_COMMUNITY): Payer: Self-pay

## 2020-12-21 ENCOUNTER — Other Ambulatory Visit: Payer: Self-pay

## 2020-12-21 ENCOUNTER — Emergency Department (HOSPITAL_COMMUNITY): Payer: PPO

## 2020-12-21 DIAGNOSIS — Z20822 Contact with and (suspected) exposure to covid-19: Secondary | ICD-10-CM | POA: Diagnosis not present

## 2020-12-21 DIAGNOSIS — Z8701 Personal history of pneumonia (recurrent): Secondary | ICD-10-CM

## 2020-12-21 DIAGNOSIS — I5033 Acute on chronic diastolic (congestive) heart failure: Secondary | ICD-10-CM | POA: Diagnosis present

## 2020-12-21 DIAGNOSIS — I493 Ventricular premature depolarization: Secondary | ICD-10-CM | POA: Diagnosis not present

## 2020-12-21 DIAGNOSIS — N179 Acute kidney failure, unspecified: Secondary | ICD-10-CM | POA: Diagnosis present

## 2020-12-21 DIAGNOSIS — Z885 Allergy status to narcotic agent status: Secondary | ICD-10-CM

## 2020-12-21 DIAGNOSIS — F0390 Unspecified dementia without behavioral disturbance: Secondary | ICD-10-CM | POA: Diagnosis present

## 2020-12-21 DIAGNOSIS — Z8249 Family history of ischemic heart disease and other diseases of the circulatory system: Secondary | ICD-10-CM

## 2020-12-21 DIAGNOSIS — Z7189 Other specified counseling: Secondary | ICD-10-CM | POA: Diagnosis not present

## 2020-12-21 DIAGNOSIS — I2781 Cor pulmonale (chronic): Secondary | ICD-10-CM | POA: Diagnosis present

## 2020-12-21 DIAGNOSIS — I517 Cardiomegaly: Secondary | ICD-10-CM | POA: Diagnosis not present

## 2020-12-21 DIAGNOSIS — K625 Hemorrhage of anus and rectum: Secondary | ICD-10-CM | POA: Diagnosis not present

## 2020-12-21 DIAGNOSIS — I248 Other forms of acute ischemic heart disease: Secondary | ICD-10-CM | POA: Diagnosis present

## 2020-12-21 DIAGNOSIS — J9622 Acute and chronic respiratory failure with hypercapnia: Secondary | ICD-10-CM | POA: Insufficient documentation

## 2020-12-21 DIAGNOSIS — G40909 Epilepsy, unspecified, not intractable, without status epilepticus: Secondary | ICD-10-CM | POA: Diagnosis present

## 2020-12-21 DIAGNOSIS — I2721 Secondary pulmonary arterial hypertension: Secondary | ICD-10-CM | POA: Diagnosis not present

## 2020-12-21 DIAGNOSIS — E039 Hypothyroidism, unspecified: Secondary | ICD-10-CM | POA: Diagnosis not present

## 2020-12-21 DIAGNOSIS — N1831 Chronic kidney disease, stage 3a: Secondary | ICD-10-CM | POA: Diagnosis present

## 2020-12-21 DIAGNOSIS — E1122 Type 2 diabetes mellitus with diabetic chronic kidney disease: Secondary | ICD-10-CM | POA: Diagnosis present

## 2020-12-21 DIAGNOSIS — Z8673 Personal history of transient ischemic attack (TIA), and cerebral infarction without residual deficits: Secondary | ICD-10-CM

## 2020-12-21 DIAGNOSIS — J9621 Acute and chronic respiratory failure with hypoxia: Secondary | ICD-10-CM | POA: Diagnosis not present

## 2020-12-21 DIAGNOSIS — I4891 Unspecified atrial fibrillation: Secondary | ICD-10-CM

## 2020-12-21 DIAGNOSIS — R0600 Dyspnea, unspecified: Secondary | ICD-10-CM | POA: Diagnosis not present

## 2020-12-21 DIAGNOSIS — J9602 Acute respiratory failure with hypercapnia: Secondary | ICD-10-CM | POA: Diagnosis not present

## 2020-12-21 DIAGNOSIS — I252 Old myocardial infarction: Secondary | ICD-10-CM

## 2020-12-21 DIAGNOSIS — Z978 Presence of other specified devices: Secondary | ICD-10-CM | POA: Diagnosis not present

## 2020-12-21 DIAGNOSIS — Z87891 Personal history of nicotine dependence: Secondary | ICD-10-CM

## 2020-12-21 DIAGNOSIS — Z794 Long term (current) use of insulin: Secondary | ICD-10-CM

## 2020-12-21 DIAGNOSIS — I130 Hypertensive heart and chronic kidney disease with heart failure and stage 1 through stage 4 chronic kidney disease, or unspecified chronic kidney disease: Secondary | ICD-10-CM | POA: Diagnosis present

## 2020-12-21 DIAGNOSIS — I272 Pulmonary hypertension, unspecified: Secondary | ICD-10-CM | POA: Diagnosis not present

## 2020-12-21 DIAGNOSIS — R059 Cough, unspecified: Secondary | ICD-10-CM | POA: Diagnosis not present

## 2020-12-21 DIAGNOSIS — Z4682 Encounter for fitting and adjustment of non-vascular catheter: Secondary | ICD-10-CM | POA: Diagnosis not present

## 2020-12-21 DIAGNOSIS — I5082 Biventricular heart failure: Secondary | ICD-10-CM | POA: Diagnosis not present

## 2020-12-21 DIAGNOSIS — M1990 Unspecified osteoarthritis, unspecified site: Secondary | ICD-10-CM | POA: Diagnosis present

## 2020-12-21 DIAGNOSIS — J449 Chronic obstructive pulmonary disease, unspecified: Secondary | ICD-10-CM | POA: Diagnosis present

## 2020-12-21 DIAGNOSIS — Z452 Encounter for adjustment and management of vascular access device: Secondary | ICD-10-CM | POA: Diagnosis not present

## 2020-12-21 DIAGNOSIS — I2510 Atherosclerotic heart disease of native coronary artery without angina pectoris: Secondary | ICD-10-CM | POA: Diagnosis present

## 2020-12-21 DIAGNOSIS — E662 Morbid (severe) obesity with alveolar hypoventilation: Secondary | ICD-10-CM | POA: Diagnosis not present

## 2020-12-21 DIAGNOSIS — R778 Other specified abnormalities of plasma proteins: Secondary | ICD-10-CM

## 2020-12-21 DIAGNOSIS — E1143 Type 2 diabetes mellitus with diabetic autonomic (poly)neuropathy: Secondary | ICD-10-CM | POA: Diagnosis not present

## 2020-12-21 DIAGNOSIS — Z888 Allergy status to other drugs, medicaments and biological substances status: Secondary | ICD-10-CM

## 2020-12-21 DIAGNOSIS — E1165 Type 2 diabetes mellitus with hyperglycemia: Secondary | ICD-10-CM | POA: Diagnosis present

## 2020-12-21 DIAGNOSIS — J9601 Acute respiratory failure with hypoxia: Secondary | ICD-10-CM | POA: Diagnosis present

## 2020-12-21 DIAGNOSIS — Z823 Family history of stroke: Secondary | ICD-10-CM

## 2020-12-21 DIAGNOSIS — I4821 Permanent atrial fibrillation: Secondary | ICD-10-CM | POA: Diagnosis present

## 2020-12-21 DIAGNOSIS — Z7901 Long term (current) use of anticoagulants: Secondary | ICD-10-CM

## 2020-12-21 DIAGNOSIS — N390 Urinary tract infection, site not specified: Secondary | ICD-10-CM | POA: Diagnosis present

## 2020-12-21 DIAGNOSIS — J811 Chronic pulmonary edema: Secondary | ICD-10-CM | POA: Diagnosis not present

## 2020-12-21 DIAGNOSIS — Z6838 Body mass index (BMI) 38.0-38.9, adult: Secondary | ICD-10-CM

## 2020-12-21 DIAGNOSIS — R053 Chronic cough: Secondary | ICD-10-CM | POA: Diagnosis present

## 2020-12-21 DIAGNOSIS — L89151 Pressure ulcer of sacral region, stage 1: Secondary | ICD-10-CM | POA: Diagnosis present

## 2020-12-21 DIAGNOSIS — Z9981 Dependence on supplemental oxygen: Secondary | ICD-10-CM

## 2020-12-21 DIAGNOSIS — I952 Hypotension due to drugs: Secondary | ICD-10-CM | POA: Diagnosis not present

## 2020-12-21 DIAGNOSIS — K5790 Diverticulosis of intestine, part unspecified, without perforation or abscess without bleeding: Secondary | ICD-10-CM | POA: Diagnosis present

## 2020-12-21 DIAGNOSIS — Z820 Family history of epilepsy and other diseases of the nervous system: Secondary | ICD-10-CM

## 2020-12-21 DIAGNOSIS — D631 Anemia in chronic kidney disease: Secondary | ICD-10-CM | POA: Diagnosis not present

## 2020-12-21 DIAGNOSIS — M40209 Unspecified kyphosis, site unspecified: Secondary | ICD-10-CM | POA: Diagnosis present

## 2020-12-21 DIAGNOSIS — F419 Anxiety disorder, unspecified: Secondary | ICD-10-CM | POA: Diagnosis present

## 2020-12-21 DIAGNOSIS — K3184 Gastroparesis: Secondary | ICD-10-CM | POA: Diagnosis present

## 2020-12-21 DIAGNOSIS — T438X5A Adverse effect of other psychotropic drugs, initial encounter: Secondary | ICD-10-CM | POA: Diagnosis not present

## 2020-12-21 DIAGNOSIS — G928 Other toxic encephalopathy: Secondary | ICD-10-CM | POA: Diagnosis present

## 2020-12-21 DIAGNOSIS — M797 Fibromyalgia: Secondary | ICD-10-CM | POA: Diagnosis present

## 2020-12-21 DIAGNOSIS — D6489 Other specified anemias: Secondary | ICD-10-CM | POA: Diagnosis not present

## 2020-12-21 DIAGNOSIS — R0602 Shortness of breath: Secondary | ICD-10-CM | POA: Diagnosis not present

## 2020-12-21 DIAGNOSIS — E876 Hypokalemia: Secondary | ICD-10-CM | POA: Diagnosis not present

## 2020-12-21 DIAGNOSIS — R7989 Other specified abnormal findings of blood chemistry: Secondary | ICD-10-CM

## 2020-12-21 DIAGNOSIS — E861 Hypovolemia: Secondary | ICD-10-CM | POA: Diagnosis present

## 2020-12-21 DIAGNOSIS — T4275XA Adverse effect of unspecified antiepileptic and sedative-hypnotic drugs, initial encounter: Secondary | ICD-10-CM | POA: Diagnosis not present

## 2020-12-21 DIAGNOSIS — R54 Age-related physical debility: Secondary | ICD-10-CM | POA: Diagnosis present

## 2020-12-21 DIAGNOSIS — I700 Atherosclerosis of aorta: Secondary | ICD-10-CM | POA: Diagnosis not present

## 2020-12-21 DIAGNOSIS — Z515 Encounter for palliative care: Secondary | ICD-10-CM | POA: Diagnosis not present

## 2020-12-21 DIAGNOSIS — Z90710 Acquired absence of both cervix and uterus: Secondary | ICD-10-CM

## 2020-12-21 DIAGNOSIS — K219 Gastro-esophageal reflux disease without esophagitis: Secondary | ICD-10-CM | POA: Diagnosis present

## 2020-12-21 DIAGNOSIS — Z79890 Hormone replacement therapy (postmenopausal): Secondary | ICD-10-CM

## 2020-12-21 DIAGNOSIS — E785 Hyperlipidemia, unspecified: Secondary | ICD-10-CM | POA: Diagnosis present

## 2020-12-21 DIAGNOSIS — Z79899 Other long term (current) drug therapy: Secondary | ICD-10-CM

## 2020-12-21 DIAGNOSIS — M549 Dorsalgia, unspecified: Secondary | ICD-10-CM | POA: Diagnosis present

## 2020-12-21 LAB — URINALYSIS, ROUTINE W REFLEX MICROSCOPIC
Bilirubin Urine: NEGATIVE
Glucose, UA: NEGATIVE mg/dL
Ketones, ur: NEGATIVE mg/dL
Nitrite: NEGATIVE
Protein, ur: NEGATIVE mg/dL
Specific Gravity, Urine: 1.013 (ref 1.005–1.030)
pH: 5 (ref 5.0–8.0)

## 2020-12-21 LAB — BLOOD GAS, VENOUS
Acid-Base Excess: 11 mmol/L — ABNORMAL HIGH (ref 0.0–2.0)
Bicarbonate: 33.1 mmol/L — ABNORMAL HIGH (ref 20.0–28.0)
FIO2: 100
O2 Saturation: 75.9 %
Patient temperature: 36.6
pCO2, Ven: 73.1 mmHg (ref 44.0–60.0)
pH, Ven: 7.326 (ref 7.250–7.430)
pO2, Ven: 46.8 mmHg — ABNORMAL HIGH (ref 32.0–45.0)

## 2020-12-21 LAB — TROPONIN I (HIGH SENSITIVITY)
Troponin I (High Sensitivity): 113 ng/L (ref <18)
Troponin I (High Sensitivity): 105 ng/L (ref <18)

## 2020-12-21 LAB — BLOOD GAS, ARTERIAL
Acid-Base Excess: 11 mmol/L — ABNORMAL HIGH (ref 0.0–2.0)
Acid-Base Excess: 7.2 mmol/L — ABNORMAL HIGH (ref 0.0–2.0)
Bicarbonate: 33.5 mmol/L — ABNORMAL HIGH (ref 20.0–28.0)
Bicarbonate: 30.5 mmol/L — ABNORMAL HIGH (ref 20.0–28.0)
FIO2: 40
FIO2: 100
O2 Saturation: 92.8 %
O2 Saturation: 99.4 %
Patient temperature: 37
Patient temperature: 37
pCO2 arterial: 68.2 mmHg (ref 32.0–48.0)
pCO2 arterial: 55 mmHg — ABNORMAL HIGH (ref 32.0–48.0)
pH, Arterial: 7.351 (ref 7.350–7.450)
pH, Arterial: 7.386 (ref 7.350–7.450)
pO2, Arterial: 71.5 mmHg — ABNORMAL LOW (ref 83.0–108.0)
pO2, Arterial: 235 mmHg — ABNORMAL HIGH (ref 83.0–108.0)

## 2020-12-21 LAB — COMPREHENSIVE METABOLIC PANEL
ALT: 13 U/L (ref 0–44)
AST: 17 U/L (ref 15–41)
Albumin: 3.2 g/dL — ABNORMAL LOW (ref 3.5–5.0)
Alkaline Phosphatase: 56 U/L (ref 38–126)
Anion gap: 8 (ref 5–15)
BUN: 15 mg/dL (ref 8–23)
CO2: 36 mmol/L — ABNORMAL HIGH (ref 22–32)
Calcium: 8.4 mg/dL — ABNORMAL LOW (ref 8.9–10.3)
Chloride: 96 mmol/L — ABNORMAL LOW (ref 98–111)
Creatinine, Ser: 0.98 mg/dL (ref 0.44–1.00)
GFR, Estimated: >60 mL/min (ref >60)
Glucose, Bld: 88 mg/dL (ref 70–99)
Potassium: 3.9 mmol/L (ref 3.5–5.1)
Sodium: 140 mmol/L (ref 135–145)
Total Bilirubin: 0.4 mg/dL (ref 0.3–1.2)
Total Protein: 6.2 g/dL — ABNORMAL LOW (ref 6.5–8.1)

## 2020-12-21 LAB — CBC WITH DIFFERENTIAL/PLATELET
Abs Immature Granulocytes: 0.01 10*3/uL (ref 0.00–0.07)
Basophils Absolute: 0.1 10*3/uL (ref 0.0–0.1)
Basophils Relative: 1 %
Eosinophils Absolute: 0.2 10*3/uL (ref 0.0–0.5)
Eosinophils Relative: 4 %
HCT: 32.4 % — ABNORMAL LOW (ref 36.0–46.0)
Hemoglobin: 9.5 g/dL — ABNORMAL LOW (ref 12.0–15.0)
Immature Granulocytes: 0 %
Lymphocytes Relative: 21 %
Lymphs Abs: 1.2 10*3/uL (ref 0.7–4.0)
MCH: 29 pg (ref 26.0–34.0)
MCHC: 29.3 g/dL — ABNORMAL LOW (ref 30.0–36.0)
MCV: 98.8 fL (ref 80.0–100.0)
Monocytes Absolute: 0.7 10*3/uL (ref 0.1–1.0)
Monocytes Relative: 12 %
Neutro Abs: 3.6 10*3/uL (ref 1.7–7.7)
Neutrophils Relative %: 62 %
Platelets: 221 10*3/uL (ref 150–400)
RBC: 3.28 MIL/uL — ABNORMAL LOW (ref 3.87–5.11)
RDW: 16.3 % — ABNORMAL HIGH (ref 11.5–15.5)
WBC: 5.7 10*3/uL (ref 4.0–10.5)
nRBC: 0 % (ref 0.0–0.2)

## 2020-12-21 LAB — RESP PANEL BY RT-PCR (FLU A&B, COVID) ARPGX2
Influenza A by PCR: NEGATIVE
Influenza B by PCR: NEGATIVE
SARS Coronavirus 2 by RT PCR: NEGATIVE

## 2020-12-21 LAB — CBG MONITORING, ED: Glucose-Capillary: 157 mg/dL — ABNORMAL HIGH (ref 70–99)

## 2020-12-21 LAB — BRAIN NATRIURETIC PEPTIDE: B Natriuretic Peptide: 453 pg/mL — ABNORMAL HIGH (ref 0.0–100.0)

## 2020-12-21 MED ORDER — HALOPERIDOL LACTATE 5 MG/ML IJ SOLN
2.0000 mg | Freq: Four times a day (QID) | INTRAMUSCULAR | Status: DC | PRN
  Administered 2020-12-21 – 2020-12-23 (×5): 2 mg via INTRAVENOUS
  Filled 2020-12-21 (×5): qty 1

## 2020-12-21 MED ORDER — MECLIZINE HCL 12.5 MG PO TABS: 25.0000 mg | ORAL_TABLET | Freq: Three times a day (TID) | ORAL | Status: DC | PRN

## 2020-12-21 MED ORDER — POTASSIUM CHLORIDE CRYS ER 20 MEQ PO TBCR: 20.0000 meq | EXTENDED_RELEASE_TABLET | Freq: Every day | ORAL | Status: DC

## 2020-12-21 MED ORDER — LEVOTHYROXINE SODIUM 75 MCG PO TABS: 175.0000 ug | ORAL_TABLET | Freq: Every day | ORAL | Status: DC

## 2020-12-21 MED ORDER — LORAZEPAM 2 MG/ML IJ SOLN: 2.0000 mg | Freq: Once | INTRAMUSCULAR | Status: AC

## 2020-12-21 MED ORDER — DEXMEDETOMIDINE HCL IN NACL 200 MCG/50ML IV SOLN
0.4000 ug/kg/h | INTRAVENOUS | Status: DC
  Administered 2020-12-21: 0.4 ug/kg/h via INTRAVENOUS
  Filled 2020-12-21: qty 50

## 2020-12-21 MED ORDER — ALBUTEROL (5 MG/ML) CONTINUOUS INHALATION SOLN
10.0000 mg/h | INHALATION_SOLUTION | RESPIRATORY_TRACT | Status: AC
  Administered 2020-12-21: 10 mg/h via RESPIRATORY_TRACT
  Filled 2020-12-21: qty 20

## 2020-12-21 MED ORDER — ETOMIDATE 2 MG/ML IV SOLN
20.0000 mg | Freq: Once | INTRAVENOUS | Status: AC
  Administered 2020-12-21: 20 mg via INTRAVENOUS

## 2020-12-21 MED ORDER — VITAMIN D 25 MCG (1000 UNIT) PO TABS: 2000.0000 [IU] | ORAL_TABLET | Freq: Every day | ORAL | Status: DC

## 2020-12-21 MED ORDER — LOPERAMIDE HCL 2 MG PO CAPS: 4.0000 mg | ORAL_CAPSULE | ORAL | Status: DC | PRN

## 2020-12-21 MED ORDER — DULOXETINE HCL 30 MG PO CPEP
30.0000 mg | ORAL_CAPSULE | Freq: Two times a day (BID) | ORAL | Status: DC
  Administered 2020-12-22 – 2020-12-26 (×5): 30 mg via ORAL
  Filled 2020-12-21 (×5): qty 1

## 2020-12-21 MED ORDER — SODIUM CHLORIDE 0.9% FLUSH
3.0000 mL | Freq: Two times a day (BID) | INTRAVENOUS | Status: DC
  Administered 2020-12-21 – 2020-12-30 (×17): 3 mL via INTRAVENOUS

## 2020-12-21 MED ORDER — PANTOPRAZOLE SODIUM 40 MG IV SOLR
40.0000 mg | Freq: Two times a day (BID) | INTRAVENOUS | Status: DC
  Administered 2020-12-22 – 2020-12-26 (×10): 40 mg via INTRAVENOUS
  Filled 2020-12-21 (×10): qty 40

## 2020-12-21 MED ORDER — ENSURE ENLIVE PO LIQD
237.0000 mL | Freq: Three times a day (TID) | ORAL | Status: DC
  Filled 2020-12-21 (×4): qty 237

## 2020-12-21 MED ORDER — FUROSEMIDE 10 MG/ML IJ SOLN
40.0000 mg | INTRAMUSCULAR | Status: AC
  Administered 2020-12-21: 40 mg via INTRAVENOUS
  Filled 2020-12-21: qty 4

## 2020-12-21 MED ORDER — CHLORHEXIDINE GLUCONATE CLOTH 2 % EX PADS
6.0000 | MEDICATED_PAD | Freq: Every day | CUTANEOUS | Status: DC
  Administered 2020-12-22 – 2020-12-28 (×7): 6 via TOPICAL

## 2020-12-21 MED ORDER — FUROSEMIDE 10 MG/ML IJ SOLN
40.0000 mg | Freq: Two times a day (BID) | INTRAMUSCULAR | Status: DC
  Administered 2020-12-21 – 2020-12-22 (×2): 40 mg via INTRAVENOUS
  Filled 2020-12-21 (×2): qty 4

## 2020-12-21 MED ORDER — DOCUSATE SODIUM 50 MG/5ML PO LIQD
100.0000 mg | Freq: Two times a day (BID) | ORAL | Status: DC
  Administered 2020-12-22 – 2020-12-26 (×6): 100 mg
  Filled 2020-12-21 (×6): qty 10

## 2020-12-21 MED ORDER — METHYLPREDNISOLONE SODIUM SUCC 125 MG IJ SOLR
125.0000 mg | Freq: Once | INTRAMUSCULAR | Status: AC
  Administered 2020-12-21: 125 mg via INTRAVENOUS
  Filled 2020-12-21: qty 2

## 2020-12-21 MED ORDER — AMIODARONE HCL 200 MG PO TABS: 200.0000 mg | ORAL_TABLET | Freq: Every morning | ORAL | Status: DC

## 2020-12-21 MED ORDER — GABAPENTIN 250 MG/5ML PO SOLN
100.0000 mg | Freq: Three times a day (TID) | ORAL | Status: DC
  Administered 2020-12-22: 100 mg
  Filled 2020-12-21 (×6): qty 2

## 2020-12-21 MED ORDER — SODIUM CHLORIDE 0.9% FLUSH: 3.0000 mL | INTRAVENOUS | Status: DC | PRN

## 2020-12-21 MED ORDER — IPRATROPIUM-ALBUTEROL 0.5-2.5 (3) MG/3ML IN SOLN: 3.0000 mL | Freq: Four times a day (QID) | RESPIRATORY_TRACT | Status: DC | PRN

## 2020-12-21 MED ORDER — ASPIRIN 81 MG PO CHEW
324.0000 mg | CHEWABLE_TABLET | Freq: Once | ORAL | Status: AC
  Administered 2020-12-21: 324 mg via ORAL
  Filled 2020-12-21: qty 4

## 2020-12-21 MED ORDER — ONDANSETRON HCL 4 MG PO TABS: 4.0000 mg | ORAL_TABLET | Freq: Four times a day (QID) | ORAL | Status: DC | PRN

## 2020-12-21 MED ORDER — POLYETHYLENE GLYCOL 3350 17 G PO PACK
17.0000 g | PACK | Freq: Every day | ORAL | Status: DC
  Administered 2020-12-22 – 2020-12-23 (×2): 17 g
  Filled 2020-12-21 (×6): qty 1

## 2020-12-21 MED ORDER — SODIUM CHLORIDE 0.9 % IV SOLN
250.0000 mL | INTRAVENOUS | Status: DC | PRN
  Administered 2020-12-22 – 2020-12-24 (×2): 250 mL via INTRAVENOUS

## 2020-12-21 MED ORDER — ALBUTEROL SULFATE (2.5 MG/3ML) 0.083% IN NEBU: 2.5000 mg | INHALATION_SOLUTION | RESPIRATORY_TRACT | Status: DC | PRN

## 2020-12-21 MED ORDER — DONEPEZIL HCL 5 MG PO TABS: 5.0000 mg | ORAL_TABLET | Freq: Every day | ORAL | Status: DC

## 2020-12-21 MED ORDER — PROPOFOL 1000 MG/100ML IV EMUL
INTRAVENOUS | Status: AC
  Administered 2020-12-21: 30 ug/kg/min via INTRAVENOUS
  Filled 2020-12-21: qty 100

## 2020-12-21 MED ORDER — DEXMEDETOMIDINE HCL IN NACL 400 MCG/100ML IV SOLN
0.4000 ug/kg/h | INTRAVENOUS | Status: DC
  Administered 2020-12-21: 1.2 ug/kg/h via INTRAVENOUS
  Administered 2020-12-22 (×3): 1 ug/kg/h via INTRAVENOUS
  Administered 2020-12-22 (×4): 1.2 ug/kg/h via INTRAVENOUS
  Administered 2020-12-23: 1.1 ug/kg/h via INTRAVENOUS
  Administered 2020-12-23 – 2020-12-24 (×13): 1.2 ug/kg/h via INTRAVENOUS
  Administered 2020-12-25: 1.165 ug/kg/h via INTRAVENOUS
  Administered 2020-12-25: 1.2 ug/kg/h via INTRAVENOUS
  Administered 2020-12-25 (×2): 0.9 ug/kg/h via INTRAVENOUS
  Administered 2020-12-25 (×3): 1.2 ug/kg/h via INTRAVENOUS
  Administered 2020-12-26 (×2): 0.5 ug/kg/h via INTRAVENOUS
  Administered 2020-12-26: 0.9 ug/kg/h via INTRAVENOUS
  Administered 2020-12-27: 0.602 ug/kg/h via INTRAVENOUS
  Filled 2020-12-21 (×11): qty 100
  Filled 2020-12-21: qty 300
  Filled 2020-12-21 (×8): qty 100
  Filled 2020-12-21: qty 300
  Filled 2020-12-21 (×10): qty 100

## 2020-12-21 MED ORDER — LORAZEPAM 2 MG/ML IJ SOLN
INTRAMUSCULAR | Status: AC
  Administered 2020-12-21: 2 mg via INTRAVENOUS
  Filled 2020-12-21: qty 1

## 2020-12-21 MED ORDER — PANTOPRAZOLE SODIUM 40 MG PO TBEC: 40.0000 mg | DELAYED_RELEASE_TABLET | Freq: Two times a day (BID) | ORAL | Status: DC

## 2020-12-21 MED ORDER — PROPOFOL 1000 MG/100ML IV EMUL: 0.0000 ug/kg/min | INTRAVENOUS | Status: DC

## 2020-12-21 MED ORDER — LEVETIRACETAM IN NACL 1000 MG/100ML IV SOLN
1000.0000 mg | Freq: Two times a day (BID) | INTRAVENOUS | Status: DC
  Administered 2020-12-22 – 2020-12-28 (×12): 1000 mg via INTRAVENOUS
  Filled 2020-12-21 (×13): qty 100

## 2020-12-21 MED ORDER — INSULIN ASPART 100 UNIT/ML IJ SOLN
0.0000 [IU] | INTRAMUSCULAR | Status: DC
  Administered 2020-12-22: 3 [IU] via SUBCUTANEOUS
  Administered 2020-12-22 (×2): 8 [IU] via SUBCUTANEOUS
  Administered 2020-12-22 (×2): 3 [IU] via SUBCUTANEOUS
  Administered 2020-12-22 – 2020-12-23 (×4): 2 [IU] via SUBCUTANEOUS
  Administered 2020-12-23: 3 [IU] via SUBCUTANEOUS
  Administered 2020-12-23: 8 [IU] via SUBCUTANEOUS
  Administered 2020-12-24 (×2): 3 [IU] via SUBCUTANEOUS
  Administered 2020-12-24: 2 [IU] via SUBCUTANEOUS
  Administered 2020-12-25: 3 [IU] via SUBCUTANEOUS
  Administered 2020-12-25: 2 [IU] via SUBCUTANEOUS
  Administered 2020-12-25: 3 [IU] via SUBCUTANEOUS
  Administered 2020-12-26: 5 [IU] via SUBCUTANEOUS
  Administered 2020-12-26: 3 [IU] via SUBCUTANEOUS
  Administered 2020-12-26: 5 [IU] via SUBCUTANEOUS
  Administered 2020-12-26: 3 [IU] via SUBCUTANEOUS
  Administered 2020-12-26: 5 [IU] via SUBCUTANEOUS
  Administered 2020-12-26: 3 [IU] via SUBCUTANEOUS
  Administered 2020-12-26 – 2020-12-27 (×2): 5 [IU] via SUBCUTANEOUS
  Administered 2020-12-27: 3 [IU] via SUBCUTANEOUS
  Administered 2020-12-27 (×2): 5 [IU] via SUBCUTANEOUS
  Administered 2020-12-27: 3 [IU] via SUBCUTANEOUS
  Administered 2020-12-28: 5 [IU] via SUBCUTANEOUS
  Administered 2020-12-28: 8 [IU] via SUBCUTANEOUS
  Administered 2020-12-28 – 2020-12-29 (×3): 5 [IU] via SUBCUTANEOUS
  Administered 2020-12-29: 3 [IU] via SUBCUTANEOUS
  Administered 2020-12-29: 2 [IU] via SUBCUTANEOUS
  Administered 2020-12-29: 3 [IU] via SUBCUTANEOUS
  Administered 2020-12-30 (×2): 2 [IU] via SUBCUTANEOUS

## 2020-12-21 MED ORDER — ACETAMINOPHEN 650 MG RE SUPP
650.0000 mg | Freq: Four times a day (QID) | RECTAL | Status: DC | PRN
  Administered 2020-12-23 – 2020-12-24 (×3): 650 mg via RECTAL
  Filled 2020-12-21 (×3): qty 1

## 2020-12-21 MED ORDER — LEVETIRACETAM 500 MG PO TABS: 1000.0000 mg | ORAL_TABLET | Freq: Two times a day (BID) | ORAL | Status: DC

## 2020-12-21 MED ORDER — FENTANYL 2500MCG IN NS 250ML (10MCG/ML) PREMIX INFUSION
0.0000 ug/h | INTRAVENOUS | Status: DC
  Administered 2020-12-21: 25 ug/h via INTRAVENOUS
  Administered 2020-12-22 (×2): 200 ug/h via INTRAVENOUS
  Administered 2020-12-23: 275 ug/h via INTRAVENOUS
  Filled 2020-12-21 (×4): qty 250

## 2020-12-21 MED ORDER — APIXABAN 5 MG PO TABS: 5.0000 mg | ORAL_TABLET | Freq: Two times a day (BID) | ORAL | Status: DC

## 2020-12-21 MED ORDER — ADULT MULTIVITAMIN W/MINERALS CH: 1.0000 | ORAL_TABLET | Freq: Every day | ORAL | Status: DC

## 2020-12-21 MED ORDER — ACETAMINOPHEN 325 MG PO TABS
650.0000 mg | ORAL_TABLET | Freq: Four times a day (QID) | ORAL | Status: DC | PRN
  Administered 2020-12-23 – 2020-12-30 (×11): 650 mg via ORAL
  Filled 2020-12-21 (×13): qty 2

## 2020-12-21 MED ORDER — ROCURONIUM BROMIDE 50 MG/5ML IV SOLN
100.0000 mg | Freq: Once | INTRAVENOUS | Status: DC
  Filled 2020-12-21: qty 10

## 2020-12-21 MED ORDER — SODIUM CHLORIDE 0.9 % IV BOLUS
500.0000 mL | Freq: Once | INTRAVENOUS | Status: AC
  Administered 2020-12-21: 500 mL via INTRAVENOUS

## 2020-12-21 MED ORDER — GABAPENTIN 100 MG PO CAPS: 100.0000 mg | ORAL_CAPSULE | Freq: Three times a day (TID) | ORAL | Status: DC

## 2020-12-21 MED ORDER — ATORVASTATIN CALCIUM 40 MG PO TABS
80.0000 mg | ORAL_TABLET | Freq: Every day | ORAL | Status: DC
  Administered 2020-12-22 – 2020-12-26 (×4): 80 mg
  Filled 2020-12-21 (×4): qty 2

## 2020-12-21 MED ORDER — SUCCINYLCHOLINE CHLORIDE 20 MG/ML IJ SOLN
100.0000 mg | Freq: Once | INTRAMUSCULAR | Status: AC
  Administered 2020-12-21: 100 mg via INTRAVENOUS

## 2020-12-21 MED ORDER — ONDANSETRON HCL 4 MG/2ML IJ SOLN: 4.0000 mg | Freq: Four times a day (QID) | INTRAMUSCULAR | Status: DC | PRN

## 2020-12-21 NOTE — ED Notes (Signed)
MD in room at time of triage for MSE. 

## 2020-12-21 NOTE — ED Notes (Signed)
Pt refusing to wear her BI/Pap, and o2. Pt is agitated and verbally abusive to family. Pts urine smells of strong odor but refuses for staff to perform in/out cath per MD orders. MD was made aware

## 2020-12-21 NOTE — Progress Notes (Addendum)
RN called due to patient trying to self-extubate despite maximum dose while on Precedex. Fentanyl drip was started. We shall continue to monitor pt's BP and consider possible Levophed if BP continues to drop.  12/21/2020 20:55 Patient continues to to be agitated despite IV fentanyl drip, IV Ativan 2 mg x 1 was given.  12/22/2020 4:03 AM Nurse called due to patient being agitated despite maximum dose on Precedex and 200 of fentanyl.  IV lorazepam 1 mg x 1 was given.

## 2020-12-21 NOTE — ED Notes (Signed)
MD at bedside. 

## 2020-12-21 NOTE — ED Notes (Signed)
Pt more calm at this time. Pt placed back on BIpap, PO meds held.

## 2020-12-21 NOTE — Consult Note (Signed)
NAME:  [name], MRN:  [id number], DOB:  08-Sep-1950, LOS: 0 ADMISSION DATE:  12/21/2020, CONSULTATION DATE:  6/20 REFERRING MD:  Sherryll Burger, [facility], CHIEF COMPLAINT:  sob   History of Present Illness:  70 y.o. female with medical history significant for obesity, type 2 diabetes, chronic atrial fibrillation on Eliquis, dementia, diastolic CHF with chronic hypoxemia on 2 L nasal cannula, hypothyroidism, seizure disorder, CKD stage III, CAD with prior NSTEMI, dyslipidemia, and GERD who presented to the ED with worsening shortness of breath over the past couple days.  Daughter at bedside states that she has had approximately 20 pound weight gain noted as well over the same time frame.  Her symptoms were worsening and EMS was called and upon arrival she was noted to have pulse oximetry of 55%.  She was noted to be taking her medications normally according to the daughter and was wearing her home oxygen.  She denied any chest pain, cough, fevers, or chills.  Some mild lower extremity edema has been noted as well.  No other sick contacts and she has had her COVID vaccines and booster shots.  She appears to have been admitted just last month with acute on chronic diastolic CHF.  Her discharge weight at that time was 214 pounds.  She is currently at 227 pounds.   ED Course: Vital signs with some mild tachycardia noted with atrial fibrillation on EKG and ABG with PCO2 68.2 and PO2 of 71.5.  Troponin of 113.  BNP 453.  Chest x-ray with some cardiomegaly and pulmonary vascular congestion noted.  COVID testing pending.  She has been started on some Solu-Medrol as well as Lasix twice daily.  She is currently on BiPAP 40% FiO2.   Baseline per daughter = mostly housbound on 2lpm 24/7 with baseline dementia but worse than usual x several weeks assoc with wt gain, no cough or fever or cp / most the swelling is in her abd not legs    Pertinent  Medical History     Significant Hospital Events: Including procedures,  antibiotic start and stop dates in addition to other pertinent events   ET 6/20    Scheduled Meds:  amiodarone  200 mg Oral q morning   apixaban  5 mg Oral BID   atorvastatin  80 mg Per Tube Daily   cholecalciferol  2,000 Units Oral Daily   docusate  100 mg Per Tube BID   donepezil  5 mg Oral Daily   DULoxetine  30 mg Oral BID   feeding supplement  237 mL Oral TID BM   furosemide  40 mg Intravenous Q12H   gabapentin  100 mg Oral TID   insulin aspart  0-15 Units Subcutaneous Q4H   levETIRAcetam  1,000 mg Oral BID   levothyroxine  175 mcg Oral Daily   multivitamin with minerals  1 tablet Oral Daily   pantoprazole  40 mg Oral BID   polyethylene glycol  17 g Per Tube Daily   potassium chloride  20 mEq Oral Daily   rocuronium  100 mg Intravenous Once   sodium chloride flush  3 mL Intravenous Q12H   Continuous Infusions:  sodium chloride     propofol (DIPRIVAN) infusion 35 mcg/kg/min (12/21/20 1640)   PRN Meds:.sodium chloride, acetaminophen **OR** acetaminophen, albuterol, haloperidol lactate, ipratropium-albuterol, loperamide, meclizine, ondansetron **OR** ondansetron (ZOFRAN) IV, sodium chloride flush    Interim History / Subjective:  Looks miserable on vent when not sedated   Objective   Blood pressure Marland Kitchen)  100/55, pulse (!) 101, temperature 97.9 F (36.6 C), temperature source Oral, resp. rate 19, height 5\' 6"  (1.676 m), weight 103 kg, SpO2 100 %.    Vent Mode: PRVC FiO2 (%):  [40 %-100 %] 50 % Set Rate:  [18 bmp] 18 bmp Vt Set:  [400 mL-470 mL] 470 mL PEEP:  [5 cmH20] 5 cmH20 Plateau Pressure:  [22 cmH20] 22 cmH20  No intake or output data in the 24 hours ending 12/21/20 1743 Filed Weights   12/21/20 0934  Weight: 103 kg    Examination: Elderly wf tries to pull out et when not heavily sedated  Tmax  97.9  HENT: oral et Lungs: distant bs  Cardiovascular: RRR no s3 Abdomen: mod distended /soft Extremities: warm trace edema only  Neuro: sedated   I personally  reviewed images and agree with radiology impression as follows:  CXR:   portable 6/20 1. Well-positioned endotracheal tube and nasal/orogastric tube. 2. No change in the appearance of the lungs or cardiomegaly. Suspect congestive heart failure.       Assessment & Plan:  Acute on chronic hypercarbic and hypoxemic resp failure with biventricular chf but only mild pulmonary edema on cxr suggesting restrictive changes  (documented by pfts 12/05/17) or chf causing decompensation (she has massive CM, kyphosis, and obesity contributing to restriction. >>> key is not to over ventilated as the hypercarbia induced increased buffering capacity will actually help with weaning by limiting excess wob which would be required to normalize pC02   AMS c/w TME likely related to acute on chronic hypercabia/ C02 narcosis  >>> best sedation probably precedex in this setting if tolerates   Underlying dementia very challenging in terms of addressing her long term outlook and for hypercabia as I strongly doubt she would be compliant with NIV  Relative hypovolemia presently making her bp borderline in setting of cor pulmonale (WHO III) with Left heart failure (WHO II) PHJ >>> ns x 500 ccc, consider change diprovan to precedex    Labs   CBC: Recent Labs  Lab 12/21/20 0953  WBC 5.7  NEUTROABS 3.6  HGB 9.5*  HCT 32.4*  MCV 98.8  PLT 221    Basic Metabolic Panel: Recent Labs  Lab 12/21/20 0953  NA 140  K 3.9  CL 96*  CO2 36*  GLUCOSE 88  BUN 15  CREATININE 0.98  CALCIUM 8.4*   GFR: Estimated Creatinine Clearance: 63.8 mL/min (by C-G formula based on SCr of 0.98 mg/dL). Recent Labs  Lab 12/21/20 0953  WBC 5.7    Liver Function Tests: Recent Labs  Lab 12/21/20 0953  AST 17  ALT 13  ALKPHOS 56  BILITOT 0.4  PROT 6.2*  ALBUMIN 3.2*   No results for input(s): LIPASE, AMYLASE in the last 168 hours. No results for input(s): AMMONIA in the last 168 hours.  ABG    Component Value  Date/Time   PHART 7.386 12/21/2020 1539   PCO2ART 55.0 (H) 12/21/2020 1539   PO2ART 235 (H) 12/21/2020 1539   HCO3 30.5 (H) 12/21/2020 1539   TCO2 27 09/06/2020 1238   ACIDBASEDEF 1.0 01/26/2015 1132   O2SAT 99.4 12/21/2020 1539     Coagulation Profile: No results for input(s): INR, PROTIME in the last 168 hours.  Cardiac Enzymes: No results for input(s): CKTOTAL, CKMB, CKMBINDEX, TROPONINI in the last 168 hours.  HbA1C: Hgb A1c MFr Bld  Date/Time Value Ref Range Status  11/22/2020 04:03 AM 7.6 (H) 4.8 - 5.6 % Final    Comment:    (  NOTE) Pre diabetes:          5.7%-6.4%  Diabetes:              >6.4%  Glycemic control for   <7.0% adults with diabetes   08/24/2020 10:02 AM 7.9 (H) 4.8 - 5.6 % Final    Comment:    (NOTE) Pre diabetes:          5.7%-6.4%  Diabetes:              >6.4%  Glycemic control for   <7.0% adults with diabetes     CBG: Recent Labs  Lab 12/21/20 1652  GLUCAP 157*      Past Medical History:  She,  has a past medical history of Abnormal pulmonary function test, Anemia, Anxiety, Arthritis, Barrett's esophagus, Chest pain, Chronic anticoagulation, Chronic combined systolic and diastolic CHF (congestive heart failure) (HCC), Chronic LBP, Diabetes mellitus, type 2 (HCC), Dysrhythmia, Gastroparesis, GERD (gastroesophageal reflux disease), Heart attack (HCC) (08/13/2020), Hiatal hernia, Hyperlipidemia, Hypertension, Hypothyroid, IBS (irritable bowel syndrome), Obesity, OSA on CPAP, Paroxysmal atrial fibrillation (HCC), Pulmonary hypertension (HCC) (01/2015), Seizures (HCC), and Syncope.   Surgical History:   Past Surgical History:  Procedure Laterality Date   BACK SURGERY  1996   BIOPSY N/A 11/08/2013   Procedure: BIOPSY  / Tissue sampling / ulcers present in small intestine;  Surgeon: West Bali, MD;  Location: [facility];  Service: Endoscopy;  Laterality: N/A;   CARDIAC CATHETERIZATION  2002   CARDIAC CATHETERIZATION N/A 01/26/2015    Procedure: Right Heart Cath;  Surgeon: Laurey Morale, MD;  Location: [facility] INVASIVE CV LAB;  Service: Cardiovascular;  Laterality: N/A;   CARDIOVASCULAR STRESS TEST  2008   Stress nuclear study   CARDIOVERSION N/A 03/06/2015   Procedure: CARDIOVERSION;  Surgeon: Laurey Morale, MD;  Location[facility] ENDOSCOPY;  Service: Cardiovascular;  Laterality: N/A;   CARPAL TUNNEL RELEASE  1994   COLONOSCOPY  11/2011   Dr. Darrick Penna: Internal hemorrhoids, mild diverticulosis. Random colon biopsies negative.   COLONOSCOPY N/A 11/08/2013   SLF: Normal mucosa in the terminal ileum/The colon IS redundant/  Moderate diverticulosis throughout the entire colon. ileum bx benign. colon bx benign   ESOPHAGOGASTRODUODENOSCOPY  2008   Barrett's without dysplasia. esphagus dilated. antral erosions, h.pylori serologies negative.   ESOPHAGOGASTRODUODENOSCOPY  11/2011   Dr. Darrick Penna: Barrett's esophagus, mild gastritis, diverticulum in the second portion of the duodenum repeat EGD 3 years. Small bowel biopsies negative. Gastric biopsy show reactive gastropathy but no H. pylori. Esophageal biopsies consistent with GERD. Next EGD 11/2014   ESOPHAGOGASTRODUODENOSCOPY N/A 11/21/2014   PVV:ZSMO non-erosive gastritis/irregular z-line   GIVENS CAPSULE STUDY  12/07/2011   Proximal small bowel, rare AVM. Distal small bowel, multiple ulcers noted   GIVENS CAPSULE STUDY N/A 09/27/2013   Distal small bowel ulcers extending to TI.   GIVENS CAPSULE STUDY N/A 10/10/2013   Procedure: GIVENS CAPSULE STUDY;  Surgeon: West Bali, MD;  Location: [facility];  Service: Endoscopy;  Laterality: N/A;  7:30   IR KYPHO THORACIC WITH BONE BIOPSY  02/09/2018   KNEE ARTHROSCOPY WITH MEDIAL MENISECTOMY Right 06/09/2016   Procedure: KNEE ARTHROSCOPY WITH MEDIAL MENISECTOMY;  Surgeon: Vickki Hearing, MD;  Location: [facility] ORS;  Service: Orthopedics;  Laterality: Right;  medial and lateral menisectomy   LAMINECTOMY  1995   L4-L5   LAPAROSCOPIC CHOLECYSTECTOMY  1990s    LEFT HEART CATHETERIZATION WITH CORONARY ANGIOGRAM  01/10/2014   Procedure: LEFT HEART CATHETERIZATION WITH CORONARY ANGIOGRAM;  Surgeon: Lesleigh Noe, MD;  Location: [facility] CATH LAB;  Service: Cardiovascular;;   RIGHT HEART CATHETERIZATION N/A 01/10/2014   Procedure: RIGHT HEART CATH;  Surgeon: Lesleigh Noe, MD;  Location: [facility] CATH LAB;  Service: Cardiovascular;  Laterality: N/A;   TOTAL ABDOMINAL HYSTERECTOMY  1999   TRACHEOSTOMY TUBE PLACEMENT N/A 09/01/2017   Procedure: TRACHEOSTOMY;  Surgeon: Drema Halon, MD;  Location: [facility] OR;  Service: ENT;  Laterality: N/A;     Social History:   reports that she quit smoking about 37 years ago. Her smoking use included cigarettes. She started smoking about 54 years ago. She has a 3.75 pack-year smoking history. She has never used smokeless tobacco. She reports that she does not drink alcohol and does not use drugs.   Family History:  Her family history includes Alzheimer's disease in her mother; Breast cancer in her sister; Heart attack in her mother; Hypertension in her mother and another family member; Stroke in her mother. There is no history of Heart disease or Colon cancer.   Allergies Allergies  Allergen Reactions   Citalopram Hydrobromide Other (See Comments)    Dyskinesia Other reaction(s): Other (See Comments) Dyskinesia   Codeine Nausea And Vomiting and Other (See Comments)    HALLUCINATIONS Other reaction(s): Unknown Other reaction(s): Other (See Comments) HALLUCINATIONS    Hydromorphone Hcl Other (See Comments)    Made her pass out Other reaction(s): Other (See Comments) Made her pass out   Metoclopramide Other (See Comments)    DYSKINESIA Other reaction(s): Other (See Comments) DYSKINESIA   Hyoscyamine     MADE DIARRHEA WORSE     Home Medications  Prior to Admission medications   Medication Sig Start Date End Date Taking? Authorizing Provider  acetaminophen (TYLENOL) 500 MG tablet Take 500 mg by mouth as  needed for moderate pain or headache.    Yes [provider]  amiodarone (PACERONE) 200 MG tablet TAKE 1 TABLET BY MOUTH EVERY MORNING 03/03/20  Yes Strader, [place] M, PA-C  apixaban (ELIQUIS) 5 MG TABS tablet Take 1 tablet (5 mg total) by mouth 2 (two) times daily. 02/14/20  Yes BranchDorothe Pea, MD  atorvastatin (LIPITOR) 80 MG tablet Place 1 tablet (80 mg total) into feeding tube daily. 09/22/20  Yes Russella Dar, NP  Cholecalciferol (VITAMIN D3) 2000 units capsule Take 2,000 Units by mouth daily. 10/25/17  Yes [provider]  DULoxetine (CYMBALTA) 30 MG capsule Take 30 mg by mouth 2 (two) times daily. 11/24/17  Yes [provider]  furosemide (LASIX) 20 MG tablet Take 1 tablet (20 mg total) by mouth daily. 09/22/20  Yes Russella Dar, NP  gabapentin (NEURONTIN) 100 MG capsule Take 1 capsule (100 mg total) by mouth 3 (three) times daily. 09/21/20  Yes Russella Dar, NP  insulin glargine (LANTUS) 100 UNIT/ML injection Inject 0.1 mLs (10 Units total) into the skin daily. Patient taking differently: Inject 20 Units into the skin daily. 09/21/20  Yes Russella Dar, NP  Lactase 9000 units CHEW 1 PO Q4H WHEN YOU EAT ICE CREAM OR CHEESE Patient taking differently: Chew 1 tablet by mouth every 4 (four) hours as needed (when eating dairy products). 09/05/19  Yes Fields, Darleene Cleaver, MD  levETIRAcetam (KEPPRA) 500 MG tablet Take 2 tablets (1,000 mg total) by mouth 2 (two) times daily. Patient taking differently: Take 1,000 mg by mouth daily. 09/21/20  Yes Russella Dar, NP  levothyroxine (SYNTHROID) 175 MCG tablet Take 1 tablet by mouth  daily. 05/20/19  Yes [provider]  loperamide (IMODIUM) 2 MG capsule 1 PO Q4H PRN LOOSE STOOLS Patient taking differently: Take 4 mg by mouth every 4 (four) hours as needed. 1 PO Q4H PRN LOOSE STOOLS 09/05/19  Yes Fields, Sandi L, MD  LORazepam (ATIVAN) 0.5 MG tablet Take 0.5 mg by mouth every 8 (eight) hours as needed for anxiety  or sedation.   Yes [provider]  meclizine (ANTIVERT) 25 MG tablet Take 1 tablet (25 mg total) by mouth every 8 (eight) hours as needed for dizziness. 10/16/20  Yes BranchDorothe Pea, Jonathan F, MD  Multiple Vitamin (MULTIVITAMIN WITH MINERALS) TABS tablet Take 1 tablet by mouth daily.   Yes [provider]  ondansetron (ZOFRAN) 4 MG tablet TAKE 1 TABLET BY MOUTH FOUR TIMES DAILY AS NEEDED FOR NAUSEA Patient taking differently: Take 4 mg by mouth 4 (four) times daily as needed for nausea. 01/10/18  Yes Gelene MinkBoone, Anna W, NP  OXYGEN 2 L daily.    Yes [provider]  pantoprazole (PROTONIX) 40 MG tablet TAKE ONE TABLET BY MOUTH TWICE DAILY BEFORE APPLY MEAL Patient taking differently: Take 40 mg by mouth 2 (two) times daily. 02/07/17  Yes Gelene MinkBoone, Anna W, NP  potassium chloride (KLOR-CON) 10 MEQ tablet Take 20 mEq by mouth daily.   Yes [provider]  SM MELATONIN 3 MG TABS tablet Take 3 mg by mouth at bedtime as needed for sleep. 08/22/20  Yes [provider]  feeding supplement (ENSURE ENLIVE / ENSURE PLUS) LIQD Take 237 mLs by mouth 3 (three) times daily between meals. 09/21/20   Russella DarEllis, Allison L, NP      The patient is critically ill with multiple organ systems failure and requires high complexity decision making for assessment and support, frequent evaluation and titration of therapies, application of advanced monitoring technologies and extensive interpretation of multiple databases. Critical Care Time devoted to patient care services described in this note is 45 minutes.    Sandrea HughsMichael Wana Mount, MD Pulmonary and Critical Care Medicine [facility] Cell [phone]   After 7:00 pm call Elink  [phone]

## 2020-12-21 NOTE — ED Notes (Signed)
MD at bedside for intubation ?

## 2020-12-21 NOTE — ED Notes (Signed)
Pt pulling at ET tube. Verbal given from Dr.wert to give 500cc of NS and to increase prop.

## 2020-12-21 NOTE — H&P (Signed)
History and Physical    [name] MVH:[id number] DOB: April 09, 1951 DOA: 12/21/2020  PCP: Gareth Morgan, MD   Patient coming from: Home  Chief Complaint: Shortness of breath and hypoxemia  HPI: [name] is a 70 y.o. female with medical history significant for obesity, type 2 diabetes, chronic atrial fibrillation on Eliquis, dementia, diastolic CHF with chronic hypoxemia on 2 L nasal cannula, hypothyroidism, seizure disorder, CKD stage III, CAD with prior NSTEMI, dyslipidemia, and GERD who presented to the ED with worsening shortness of breath over the past couple days.  Daughter at bedside states that she has had approximately 20 pound weight gain noted as well over the same timeframe.  Her symptoms were worsening and EMS was called and upon arrival she was noted to have pulse oximetry of 55%.  She was noted to be taking her medications normally according to the daughter and was wearing her home oxygen.  She denied any chest pain, cough, fevers, or chills.  Some mild lower extremity edema has been noted as well.  No other sick contacts and she has had her COVID vaccines and booster shots.  She appears to have been admitted just last month with acute on chronic diastolic CHF.  Her discharge weight at that time was 214 pounds.  She is currently at 227 pounds.   ED Course: Vital signs with some mild tachycardia noted with atrial fibrillation on EKG and ABG with PCO2 68.2 and PO2 of 71.5.  Troponin of 113.  BNP 453.  Chest x-ray with some cardiomegaly and pulmonary vascular congestion noted.  COVID testing pending.  She has been started on some Solu-Medrol as well as Lasix twice daily.  She is currently on BiPAP 40% FiO2.  Review of Systems: Reviewed as noted above, otherwise negative.  Past Medical History:  Diagnosis Date   Abnormal pulmonary function test    Anemia    H/H of 10/30 with a normal MCV in 12/09   Anxiety    Arthritis    Barrett's esophagus    Diagnosed 1995. Last EGD  2016-NO BARRETT'S.    Chest pain    Negative cardiac catheterization in 2002; negative stress nuclear study in 2008   Chronic anticoagulation    Chronic combined systolic and diastolic CHF (congestive heart failure) (HCC)    a. EF predominantly normal during prior echoes but was 40% during 10/2014 echo. b. Most recent 01/2015 EF normal, 55-60%.   Chronic LBP    Surgical intervention in 1996   Diabetes mellitus, type 2 (HCC)    Insulin therapy; exacerbated by prednisone   Dysrhythmia    AFib   Gastroparesis    99% retention 05/2008 on GES   GERD (gastroesophageal reflux disease)    Heart attack (HCC) 08/13/2020   Hiatal hernia    Hyperlipidemia    Hypertension    Hypothyroid    IBS (irritable bowel syndrome)    Obesity    OSA on CPAP    had CPAP and cannot tolerate.   Paroxysmal atrial fibrillation (HCC)    Pulmonary hypertension (HCC) 01/2015   a. Predominantly pulmonary venous hypertension but may be component of PAH.   Seizures (HCC)    last seizure was 2 years ago; on keppra for this; unknown etiology   Syncope    a. Admitted 05/2009; magnetic resonance imagin/ MRA - negative; etiology thought to be orthostasis secondary to drugs and dehydration. b. Syncope 02/2015 also felt 2/2 dehydration.    Past Surgical History:  Procedure Laterality  Date   BACK SURGERY  1996   BIOPSY N/A 11/08/2013   Procedure: BIOPSY  / Tissue sampling / ulcers present in small intestine;  Surgeon: West Bali, MD;  Location: [facility];  Service: Endoscopy;  Laterality: N/A;   CARDIAC CATHETERIZATION  2002   CARDIAC CATHETERIZATION N/A 01/26/2015   Procedure: Right Heart Cath;  Surgeon: Laurey Morale, MD;  Location[facility] INVASIVE CV LAB;  Service: Cardiovascular;  Laterality: N/A;   CARDIOVASCULAR STRESS TEST  2008   Stress nuclear study   CARDIOVERSION N/A 03/06/2015   Procedure: CARDIOVERSION;  Surgeon: Laurey Morale, MD;  Location: [facility] ENDOSCOPY;  Service: Cardiovascular;  Laterality: N/A;    CARPAL TUNNEL RELEASE  1994   COLONOSCOPY  11/2011   Dr. Darrick Penna: Internal hemorrhoids, mild diverticulosis. Random colon biopsies negative.   COLONOSCOPY N/A 11/08/2013   SLF: Normal mucosa in the terminal ileum/The colon IS redundant/  Moderate diverticulosis throughout the entire colon. ileum bx benign. colon bx benign   ESOPHAGOGASTRODUODENOSCOPY  2008   Barrett's without dysplasia. esphagus dilated. antral erosions, h.pylori serologies negative.   ESOPHAGOGASTRODUODENOSCOPY  11/2011   Dr. Darrick Penna: Barrett's esophagus, mild gastritis, diverticulum in the second portion of the duodenum repeat EGD 3 years. Small bowel biopsies negative. Gastric biopsy show reactive gastropathy but no H. pylori. Esophageal biopsies consistent with GERD. Next EGD 11/2014   ESOPHAGOGASTRODUODENOSCOPY N/A 11/21/2014   JYN:WGNF non-erosive gastritis/irregular z-line   GIVENS CAPSULE STUDY  12/07/2011   Proximal small bowel, rare AVM. Distal small bowel, multiple ulcers noted   GIVENS CAPSULE STUDY N/A 09/27/2013   Distal small bowel ulcers extending to TI.   GIVENS CAPSULE STUDY N/A 10/10/2013   Procedure: GIVENS CAPSULE STUDY;  Surgeon: West Bali, MD;  Location: [facility];  Service: Endoscopy;  Laterality: N/A;  7:30   IR KYPHO THORACIC WITH BONE BIOPSY  02/09/2018   KNEE ARTHROSCOPY WITH MEDIAL MENISECTOMY Right 06/09/2016   Procedure: KNEE ARTHROSCOPY WITH MEDIAL MENISECTOMY;  Surgeon: Vickki Hearing, MD;  Location: [facility] ORS;  Service: Orthopedics;  Laterality: Right;  medial and lateral menisectomy   LAMINECTOMY  1995   L4-L5   LAPAROSCOPIC CHOLECYSTECTOMY  1990s   LEFT HEART CATHETERIZATION WITH CORONARY ANGIOGRAM  01/10/2014   Procedure: LEFT HEART CATHETERIZATION WITH CORONARY ANGIOGRAM;  Surgeon: Lesleigh Noe, MD;  Location: [facility] CATH LAB;  Service: Cardiovascular;;   RIGHT HEART CATHETERIZATION N/A 01/10/2014   Procedure: RIGHT HEART CATH;  Surgeon: Lesleigh Noe, MD;  Location: [facility] CATH LAB;  Service:  Cardiovascular;  Laterality: N/A;   TOTAL ABDOMINAL HYSTERECTOMY  1999   TRACHEOSTOMY TUBE PLACEMENT N/A 09/01/2017   Procedure: TRACHEOSTOMY;  Surgeon: Drema Halon, MD;  Location: [facility] OR;  Service: ENT;  Laterality: N/A;     reports that she quit smoking about 37 years ago. Her smoking use included cigarettes. She started smoking about 54 years ago. She has a 3.75 pack-year smoking history. She has never used smokeless tobacco. She reports that she does not drink alcohol and does not use drugs.  Allergies  Allergen Reactions   Citalopram Hydrobromide Other (See Comments)    Dyskinesia Other reaction(s): Other (See Comments) Dyskinesia   Codeine Nausea And Vomiting and Other (See Comments)    HALLUCINATIONS Other reaction(s): Unknown Other reaction(s): Other (See Comments) HALLUCINATIONS    Hydromorphone Hcl Other (See Comments)    Made her pass out Other reaction(s): Other (See Comments) Made her pass out   Metoclopramide Other (See  Comments)    DYSKINESIA Other reaction(s): Other (See Comments) DYSKINESIA   Hyoscyamine     MADE DIARRHEA WORSE    Family History  Problem Relation Age of Onset   Hypertension Mother    Alzheimer's disease Mother    Stroke Mother    Heart attack Mother    Hypertension Other    Breast cancer Sister    Heart disease Neg Hx    Colon cancer Neg Hx     Prior to Admission medications   Medication Sig Start Date End Date Taking? Authorizing Provider  acetaminophen (TYLENOL) 500 MG tablet Take 500 mg by mouth as needed for moderate pain or headache.     [provider]  amiodarone (PACERONE) 200 MG tablet TAKE 1 TABLET BY MOUTH EVERY MORNING 03/03/20   Strader, GrenadaBrittany M, PA-C  apixaban (ELIQUIS) 5 MG TABS tablet Take 1 tablet (5 mg total) by mouth 2 (two) times daily. 02/14/20   Antoine PocheBranch, Jonathan F, MD  atorvastatin (LIPITOR) 80 MG tablet Place 1 tablet (80 mg total) into feeding tube daily. 09/22/20   Russella DarEllis, Allison L, NP   Cholecalciferol (VITAMIN D3) 2000 units capsule Take 2,000 Units by mouth daily. 10/25/17   [provider]  donepezil (ARICEPT) 5 MG tablet Take 5 mg by mouth daily.    [provider]  DULoxetine (CYMBALTA) 30 MG capsule Take 30 mg by mouth 2 (two) times daily. 11/24/17   [provider]  feeding supplement (ENSURE ENLIVE / ENSURE PLUS) LIQD Take 237 mLs by mouth 3 (three) times daily between meals. 09/21/20   Russella DarEllis, Allison L, NP  furosemide (LASIX) 20 MG tablet Take 1 tablet (20 mg total) by mouth daily. 09/22/20   Russella DarEllis, Allison L, NP  gabapentin (NEURONTIN) 100 MG capsule Take 1 capsule (100 mg total) by mouth 3 (three) times daily. 09/21/20   Russella DarEllis, Allison L, NP  insulin glargine (LANTUS) 100 UNIT/ML injection Inject 0.1 mLs (10 Units total) into the skin daily. Patient taking differently: Inject 20 Units into the skin daily. 09/21/20   Russella DarEllis, Allison L, NP  Lactase 9000 units CHEW 1 PO Q4H WHEN YOU EAT ICE CREAM OR CHEESE Patient taking differently: Chew 1 tablet by mouth every 4 (four) hours as needed (when eating dairy products). 09/05/19   Fields, Darleene CleaverSandi L, MD  levETIRAcetam (KEPPRA) 500 MG tablet Take 2 tablets (1,000 mg total) by mouth 2 (two) times daily. 09/21/20   Russella DarEllis, Allison L, NP  levothyroxine (SYNTHROID) 175 MCG tablet Take 1 tablet by mouth daily. 05/20/19   [provider]  loperamide (IMODIUM) 2 MG capsule 1 PO Q4H PRN LOOSE STOOLS Patient taking differently: Take 4 mg by mouth every 4 (four) hours as needed. 1 PO Q4H PRN LOOSE STOOLS 09/05/19   Fields, Sandi L, MD  LORazepam (ATIVAN) 0.5 MG tablet Take 0.5 mg by mouth every 8 (eight) hours as needed for anxiety or sedation.    [provider]  meclizine (ANTIVERT) 25 MG tablet Take 1 tablet (25 mg total) by mouth every 8 (eight) hours as needed for dizziness. 10/16/20   Antoine PocheBranch, Jonathan F, MD  Multiple Vitamin (MULTIVITAMIN WITH MINERALS) TABS tablet Take 1 tablet by mouth daily.     [provider]  ondansetron (ZOFRAN) 4 MG tablet TAKE 1 TABLET BY MOUTH FOUR TIMES DAILY AS NEEDED FOR NAUSEA Patient taking differently: Take 4 mg by mouth 4 (four) times daily as needed for nausea. 01/10/18   Gelene MinkBoone, Anna W, NP  OXYGEN  2 L daily.     [provider]  pantoprazole (PROTONIX) 40 MG tablet TAKE ONE TABLET BY MOUTH TWICE DAILY BEFORE APPLY MEAL Patient taking differently: Take 40 mg by mouth 2 (two) times daily. 02/07/17   Gelene Mink, NP  potassium chloride (KLOR-CON) 10 MEQ tablet Take 20 mEq by mouth daily.    [provider]  SM MELATONIN 3 MG TABS tablet Take 3 mg by mouth at bedtime as needed for sleep. 08/22/20   [provider]    Physical Exam: Vitals:   12/21/20 0934 12/21/20 0939 12/21/20 1120 12/21/20 1206  BP: 125/82  121/80 133/78  Pulse: 92  (!) 103 (!) 102  Resp: (!) 22  19 20   Temp: 97.9 F (36.6 C)     TempSrc: Oral     SpO2: (!) 88% 100%  100%  Weight: 103 kg     Height: 5\' 6"  (1.676 m)       Constitutional: NAD, calm, comfortable, obese Vitals:   12/21/20 0934 12/21/20 0939 12/21/20 1120 12/21/20 1206  BP: 125/82  121/80 133/78  Pulse: 92  (!) 103 (!) 102  Resp: (!) 22  19 20   Temp: 97.9 F (36.6 C)     TempSrc: Oral     SpO2: (!) 88% 100%  100%  Weight: 103 kg     Height: 5\' 6"  (1.676 m)      Eyes: lids and conjunctivae normal Neck: normal, supple Respiratory: Mild crackles bilaterally.  Normal respiratory effort. No accessory muscle use.  On BiPAP FiO2 40%. Cardiovascular: irregular rate and rhythm, no murmurs. Abdomen: no tenderness, no distention. Bowel sounds positive.  Musculoskeletal: Scant bilateral lower extremity edema Skin: no rashes, lesions, ulcers.  Psychiatric: Flat affect  Labs on Admission: I have personally reviewed following labs and imaging studies  CBC: Recent Labs  Lab 12/21/20 0953  WBC 5.7  NEUTROABS 3.6  HGB 9.5*  HCT 32.4*  MCV 98.8  PLT 221   Basic Metabolic  Panel: Recent Labs  Lab 12/21/20 0953  NA 140  K 3.9  CL 96*  CO2 36*  GLUCOSE 88  BUN 15  CREATININE 0.98  CALCIUM 8.4*   GFR: Estimated Creatinine Clearance: 63.8 mL/min (by C-G formula based on SCr of 0.98 mg/dL). Liver Function Tests: Recent Labs  Lab 12/21/20 0953  AST 17  ALT 13  ALKPHOS 56  BILITOT 0.4  PROT 6.2*  ALBUMIN 3.2*   No results for input(s): LIPASE, AMYLASE in the last 168 hours. No results for input(s): AMMONIA in the last 168 hours. Coagulation Profile: No results for input(s): INR, PROTIME in the last 168 hours. Cardiac Enzymes: No results for input(s): CKTOTAL, CKMB, CKMBINDEX, TROPONINI in the last 168 hours. BNP (last 3 results) No results for input(s): PROBNP in the last 8760 hours. HbA1C: No results for input(s): HGBA1C in the last 72 hours. CBG: No results for input(s): GLUCAP in the last 168 hours. Lipid Profile: No results for input(s): CHOL, HDL, LDLCALC, TRIG, CHOLHDL, LDLDIRECT in the last 72 hours. Thyroid Function Tests: No results for input(s): TSH, T4TOTAL, FREET4, T3FREE, THYROIDAB in the last 72 hours. Anemia Panel: No results for input(s): VITAMINB12, FOLATE, FERRITIN, TIBC, IRON, RETICCTPCT in the last 72 hours. Urine analysis:    Component Value Date/Time   COLORURINE AMBER (A) 11/21/2020 1820   APPEARANCEUR HAZY (A) 11/21/2020 1820   LABSPEC 1.025 11/21/2020 1820   PHURINE 5.0 11/21/2020 1820   GLUCOSEU NEGATIVE 11/21/2020 1820   HGBUR NEGATIVE  11/21/2020 1820   BILIRUBINUR NEGATIVE 11/21/2020 1820   KETONESUR NEGATIVE 11/21/2020 1820   PROTEINUR 30 (A) 11/21/2020 1820   UROBILINOGEN 0.2 02/05/2015 1700   NITRITE NEGATIVE 11/21/2020 1820   LEUKOCYTESUR NEGATIVE 11/21/2020 1820    Radiological Exams on Admission: DG Chest Port 1 View  Result Date: 12/21/2020 CLINICAL DATA:  Cough, shortness of breath EXAM: PORTABLE CHEST 1 VIEW COMPARISON:  11/21/2020 FINDINGS: Bilateral diffuse interstitial thickening. No focal  consolidation. No pleural effusion or pneumothorax. Stable cardiomegaly. No acute osseous abnormality. IMPRESSION: Cardiomegaly with mild pulmonary vascular congestion. Electronically Signed   By: Elige Ko   On: 12/21/2020 10:33    EKG: Independently reviewed. Afib 96 bpm.  Assessment/Plan Active Problems:   Acute hypoxemic respiratory failure (HCC)    Acute on chronic hypoxemic and hypercarbic respiratory failure -Has had prior intubations and tracheostomy -Noted to have pulmonary arterial hypertension on prior CT 11/2020 -COVID testing pending, but vaccinated and boosted -Currently on BiPAP -Monitor PCO2 with repeat VBG -IV Lasix twice daily for volume overload -Monitor in stepdown unit  Acute on chronic diastolic CHF exacerbation -Recent 2D echocardiogram 5/22 with LVEF 55-60% and indeterminate diastolic parameters -Plan to diurese with IV Lasix twice daily as ordered -Strict I's and O's -Fluid restriction -Monitor daily weights -Repeat a.m. labs  Elevated troponin -Likely secondary to above -Continue to trend and monitor  Chronic atrial fibrillation-currently rate controlled -Continue amiodarone and Eliquis -Monitor on telemetry  Type 2 diabetes -Recent hemoglobin A1c 7.6% -Carb modified diet once off BiPAP -SSI  CKD stage IIIa -Stable creatinine noted, monitor with diuresis  CAD with prior NSTEMI/dyslipidemia -Continue statin  Anemia of chronic disease -Baseline hemoglobin near 10, transfuse for hemoglobin less than 7  OSA/OHS -Patient intolerant to CPAP  Hypothyroidism -Recent TSH 0.526 -Continue Synthroid  History of seizures -Continue Keppra  History of dementia -Continue Aricept  Stage I sacral ulcer -Present on admission -Wound care  Obesity -BMI 36.64 -Lifestyle changes outpatient   DVT prophylaxis: Eliquis Code Status: Full Family Communication: Daughter, [name] at bedside Disposition Plan:Admit for respiratory failure Consults  called:None Admission status: Inpatient, SDU  Saylee Sherrill D Tabbitha Janvrin DO [facility]  If 7PM-7AM, please contact night-coverage [email or web address]  12/21/2020, 12:27 PM

## 2020-12-21 NOTE — ED Notes (Signed)
MD aware of pts pressures

## 2020-12-21 NOTE — ED Notes (Signed)
[name] out at the nurses station and stating she is very upset because she walked in room and found pt without her O2 on; explained to family member that she was triaged and when the triage nurse left the room pt was on O2; explained to daughter that when ems brings pt in, pt has to be hooked up to monitor and ems gives [place] report; pt vs taken and pt at 86% on 5L of O2; Dr. Hyacinth Meeker informed of situation and went in to evaluate pt and speak with daughter; O2 sats upon arrival to room were 92%

## 2020-12-21 NOTE — Progress Notes (Signed)
eLink Physician-Brief Progress Note Patient Name: [name] DOB: 1950-10-02 MRN: [id number]   Date of Service  12/21/2020  HPI/Events of Note  9 F obese (BMI 37), afib on apixaban, HTN, DM, dyslipidemia, HFpEF, CKD, seizure , on home O2 presented with worsening SOB with weight gain  eICU Interventions  Patient intubated for hypercapneic and hypoxic respiratory failure secondary to HF exacerbation     Intervention Category Evaluation Type: New Patient Evaluation  [name] 12/21/2020, 9:50 PM

## 2020-12-21 NOTE — ED Notes (Signed)
Pt intubated, color change noted.  

## 2020-12-21 NOTE — ED Provider Notes (Addendum)
[facility] EMERGENCY DEPARTMENT Provider Note   CSN: [id number] Arrival date & time: 12/21/20  2841     History Chief Complaint  Patient presents with  . Shortness of Breath    [name] is a 70 y.o. female.  HPI  This patient is a 70 year old female, she has multiple medical problems including congestive heart failure, she has diabetes, she has gastroparesis, acid reflux, hypertension and hyperlipidemia, she has paroxysmal atrial fibrillation and has been known to have pulmonary hypertension syncope and respiratory failure with hypoxia.  She even required a tracheostomy a couple of years ago.  The patient no longer has a tracheostomy  She lives at home, family member was there with her this morning, reports that her oxygen level was low.  The patient has been feeling short of breath for couple of days and has some intermittent coughing but no fevers or significant swelling of her legs.  The hypoxia was measured by the paramedics who had measurements that were no higher than 60% and placed her on a high flow nasal cannula.  They reported normal lung sounds and that she did not appear to be in distress though she had some increased work of breathing.  The patient denies any significant vomiting or diarrhea, appetite has been okay, she has had some dysuria.  She denies rashes to her skin and has no chest pain at this time.  She does have a bit of a chronic cough for the last couple of months  Of note the patient does take amiodarone, Eliquis, insulin, Keppra as medications of importance.    Past Medical History:  Diagnosis Date  . Abnormal pulmonary function test   . Anemia    H/H of 10/30 with a normal MCV in 12/09  . Anxiety   . Arthritis   . Barrett's esophagus    Diagnosed 1995. Last EGD 2016-NO BARRETT'S.   . Chest pain    Negative cardiac catheterization in 2002; negative stress nuclear study in 2008  . Chronic anticoagulation   . Chronic combined systolic and  diastolic CHF (congestive heart failure) (HCC)    a. EF predominantly normal during prior echoes but was 40% during 10/2014 echo. b. Most recent 01/2015 EF normal, 55-60%.  . Chronic LBP    Surgical intervention in 1996  . Diabetes mellitus, type 2 (HCC)    Insulin therapy; exacerbated by prednisone  . Dysrhythmia    AFib  . Gastroparesis    99% retention 05/2008 on GES  . GERD (gastroesophageal reflux disease)   . Heart attack ([place]) 08/13/2020  . Hiatal hernia   . Hyperlipidemia   . Hypertension   . Hypothyroid   . IBS (irritable bowel syndrome)   . Obesity   . OSA on CPAP    had CPAP and cannot tolerate.  . Paroxysmal atrial fibrillation ([place])   . Pulmonary hypertension ([place]) 01/2015   a. Predominantly pulmonary venous hypertension but may be component of PAH.  . Seizures ([place])    last seizure was 2 years ago; on keppra for this; unknown etiology  . Syncope    a. Admitted 05/2009; magnetic resonance imagin/ MRA - negative; etiology thought to be orthostasis secondary to drugs and dehydration. b. Syncope 02/2015 also felt 2/2 dehydration.    Patient Active Problem List   Diagnosis Date Noted  . Acute hypoxemic respiratory failure ([place]) 12/21/2020  . Acute on chronic respiratory failure with hypoxia ([place]) 11/22/2020  . Altered mental status 11/21/2020  . Hypoalbuminemia due  to protein-calorie malnutrition ([place]) 11/21/2020  . Pressure ulcer 11/21/2020  . Acute metabolic encephalopathy 31/54/0086  . Acute ischemic stroke ([place])   . Respiratory failure with hypoxia ([place]) 08/24/2020  . Lobar pneumonia ([place]) 08/24/2020  . Septic shock ([place]) 08/24/2020  . NSTEMI (non-ST elevated myocardial infarction) ([place]) 08/17/2020  . Constipation 03/17/2020  . Knee pain 10/07/2019  . Fibromyalgia 10/07/2019  . Polyarthropathy 10/07/2019  . Fibromyositis 09/30/2019  . Abnormal gait due to muscle weakness 09/30/2019  . Pain in right knee 09/30/2019  . Depression 08/31/2018  . Anxiety  08/31/2018  . Chronic respiratory failure with hypoxia and hypercapnia ([place]) 12/13/2017  . Acute systolic HF (heart failure) ([place]) 11/26/2017  . Palliative care by specialist   . Goals of care, counseling/discussion   . Acute on chronic diastolic CHF (congestive heart failure) ([place]) 08/17/2017  . Acute respiratory failure with hypoxia and hypercapnia ([place]) 08/16/2017  . Uncontrolled type 2 diabetes mellitus with hyperglycemia ([place]) 08/16/2017  . Acute respiratory failure ([place])   . CAP (community acquired pneumonia) 08/05/2017  . COPD exacerbation ([place]) 08/05/2017  . Pulmonary hypertension ([place]) 04/05/2017  . Derangement of posterior horn of medial meniscus of right knee   . Lateral meniscus, anterior horn derangement, right   . Syncope, near 12/26/2015  . Fall at home 12/26/2015  . Dysphagia 12/07/2015  . IDA (iron deficiency anemia) 03/13/2015  . Chronic diastolic CHF (congestive heart failure) ([place]) 02/20/2015  . Diffuse abdominal pain 09/16/2014  . Ascites 08/27/2014  . Macrocytic anemia 07/20/2014  . Epistaxis, recurrent 07/20/2014  . Seizure ([place]) 04/11/2014  . Dementia ([place]) 04/11/2014  . Chronic diarrhea 04/11/2014  . DM type 2 (diabetes mellitus, type 2) ([place]) 04/11/2014  . Protein-calorie malnutrition, severe ([place]) 04/11/2014  . Abnormal weight loss 08/26/2013  . Lower abdominal pain 08/26/2013  . Anorexia nervosa 08/26/2013  . Encounter for therapeutic drug monitoring 08/26/2013  . DOE (dyspnea on exertion) 06/29/2013  . Tremor 10/27/2012  . Chronic anticoagulation 06/23/2011  . HYPOTENSION, ORTHOSTATIC 08/26/2009  . Hyperlipidemia 03/04/2009  . Barrett's esophagus 09/26/2008  . Gastroparesis 09/26/2008  . Iron deficiency anemia 09/25/2008  . Essential hypertension 09/25/2008  . Atelectasis 09/25/2008  . Irritable bowel syndrome 09/25/2008  . Sleep apnea 09/25/2008  . CARPAL TUNNEL SYNDROME, HX OF 09/25/2008  . Hypothyroidism 04/10/2008  . Body mass index (BMI)  40.0-44.9, adult ([place]) 04/10/2008  . Atrial fibrillation ([place]) 04/10/2008  . Acute exacerbation of CHF (congestive heart failure) ([place]) 04/10/2008  . GERD (gastroesophageal reflux disease) 04/10/2008  . Low back pain 04/10/2008  . CHEST PAIN-PRECORDIAL 04/10/2008  . Obesity 04/10/2008    Past Surgical History:  Procedure Laterality Date  . BACK SURGERY  1996  . BIOPSY N/A 11/08/2013   Procedure: BIOPSY  / Tissue sampling / ulcers present in small intestine;  Surgeon: Danie Binder, MD;  Location: [facility] ENDO SUITE;  Service: Endoscopy;  Laterality: N/A;  . CARDIAC CATHETERIZATION  2002  . CARDIAC CATHETERIZATION N/A 01/26/2015   Procedure: Right Heart Cath;  Surgeon: Larey Dresser, MD;  Location: [facility] CV LAB;  Service: Cardiovascular;  Laterality: N/A;  . CARDIOVASCULAR STRESS TEST  2008   Stress nuclear study  . CARDIOVERSION N/A 03/06/2015   Procedure: CARDIOVERSION;  Surgeon: Larey Dresser, MD;  Location: [facility];  Service: Cardiovascular;  Laterality: N/A;  . CARPAL [place]  . COLONOSCOPY  11/2011   Dr. Oneida Alar: Internal hemorrhoids, mild diverticulosis. Random colon biopsies negative.  . COLONOSCOPY N/A 11/08/2013  SLF: Normal mucosa in the terminal ileum/The colon IS redundant/  Moderate diverticulosis throughout the entire colon. ileum bx benign. colon bx benign  . ESOPHAGOGASTRODUODENOSCOPY  2008   Barrett's without dysplasia. esphagus dilated. antral erosions, h.pylori serologies negative.  . ESOPHAGOGASTRODUODENOSCOPY  11/2011   Dr. Oneida Alar: Barrett's esophagus, mild gastritis, diverticulum in the second portion of the duodenum repeat EGD 3 years. Small bowel biopsies negative. Gastric biopsy show reactive gastropathy but no H. pylori. Esophageal biopsies consistent with GERD. Next EGD 11/2014  . ESOPHAGOGASTRODUODENOSCOPY N/A 11/21/2014   IWP:YKDX non-erosive gastritis/irregular z-line  . GIVENS CAPSULE STUDY  12/07/2011   Proximal small bowel, rare AVM. Distal  small bowel, multiple ulcers noted  . GIVENS CAPSULE STUDY N/A 09/27/2013   Distal small bowel ulcers extending to TI.  Marland Kitchen GIVENS CAPSULE STUDY N/A 10/10/2013   Procedure: GIVENS CAPSULE STUDY;  Surgeon: Danie Binder, MD;  Location: [facility] END[facility];  Service: Endoscopy;  Laterality: N/A;  7:30  . IR KYPHO THORACIC WITH BONE BIOPSY  02/09/2018  . KNEE ARTHROSCOPY WITH MEDIAL MENISECTOMY Right 06/09/2016   Procedure: KNEE ARTHROSCOPY WITH MEDIAL MENISECTOMY;  Surgeon: Carole Civil, MD;  Location: [facility] ORS;  Service: Orthopedics;  Laterality: Right;  medial and lateral menisectomy  . LAMINECTOMY  1995   L4-L5  . LAPAROSCOPIC CHOLECYSTECTOMY  1990s  . LEFT HEART CATHETERIZATION WITH CORONARY ANGIOGRAM  01/10/2014   Procedure: LEFT HEART CATHETERIZATION WITH CORONARY ANGIOGRAM;  Surgeon: Sinclair Grooms, MD;  Location: [facility] CATH LAB;  Service: Cardiovascular;;  . RIGHT HEART CATHETERIZATION N/A 01/10/2014   Procedure: RIGHT HEART CATH;  Surgeon: Sinclair Grooms, MD;  Location: [facility] CATH LAB;  Service: Cardiovascular;  Laterality: N/A;  . TOTAL ABDOMINAL HYSTERECTOMY  1999  . TRACHEOSTOMY TUBE PLACEMENT N/A 09/01/2017   Procedure: TRACHEOSTOMY;  Surgeon: Rozetta Nunnery, MD;  Location: [facility] OR;  Service: ENT;  Laterality: N/A;     OB History     Gravida  2   Para  2   Term  2   Preterm      AB      Living         SAB      IAB      Ectopic      Multiple      Live Births              Family History  Problem Relation Age of Onset  . Hypertension Mother   . Alzheimer's disease Mother   . Stroke Mother   . Heart attack Mother   . Hypertension Other   . Breast cancer Sister   . Heart disease Neg Hx   . Colon cancer Neg Hx     Social History   Tobacco Use  . Smoking status: Former    Packs/day: 0.25    Years: 15.00    Pack years: 3.75    Types: Cigarettes    Start date: 02/26/1966    Quit date: 07/01/1983    Years since quitting: 37.5  . Smokeless tobacco:  Never  . Tobacco comments:    quit in 1984  Vaping Use  . Vaping Use: Never used  Substance Use Topics  . Alcohol use: No    Alcohol/week: 0.0 standard drinks  . Drug use: No    Home Medications Prior to Admission medications   Medication Sig Start Date End Date Taking? Authorizing Provider  acetaminophen (TYLENOL) 500 MG tablet Take 500 mg by mouth as needed  for moderate pain or headache.     [provider]  amiodarone (PACERONE) 200 MG tablet TAKE 1 TABLET BY MOUTH EVERY MORNING 03/03/20   Strader, [place] M, PA-C  apixaban (ELIQUIS) 5 MG TABS tablet Take 1 tablet (5 mg total) by mouth 2 (two) times daily. 02/14/20   Arnoldo Lenis, MD  atorvastatin (LIPITOR) 80 MG tablet Place 1 tablet (80 mg total) into feeding tube daily. 09/22/20   Samella Parr, NP  Cholecalciferol (VITAMIN D3) 2000 units capsule Take 2,000 Units by mouth daily. 10/25/17   [provider]  donepezil (ARICEPT) 5 MG tablet Take 5 mg by mouth daily.    [provider]  DULoxetine (CYMBALTA) 30 MG capsule Take 30 mg by mouth 2 (two) times daily. 11/24/17   [provider]  feeding supplement (ENSURE ENLIVE / ENSURE PLUS) LIQD Take 237 mLs by mouth 3 (three) times daily between meals. 09/21/20   Samella Parr, NP  furosemide (LASIX) 20 MG tablet Take 1 tablet (20 mg total) by mouth daily. 09/22/20   Samella Parr, NP  gabapentin (NEURONTIN) 100 MG capsule Take 1 capsule (100 mg total) by mouth 3 (three) times daily. 09/21/20   Samella Parr, NP  insulin glargine (LANTUS) 100 UNIT/ML injection Inject 0.1 mLs (10 Units total) into the skin daily. Patient taking differently: Inject 20 Units into the skin daily. 09/21/20   Samella Parr, NP  Lactase 9000 units CHEW 1 PO Q4H WHEN YOU EAT ICE CREAM OR CHEESE Patient taking differently: Chew 1 tablet by mouth every 4 (four) hours as needed (when eating dairy products). 09/05/19   Fields, Marga Melnick, MD  levETIRAcetam (KEPPRA) 500 MG  tablet Take 2 tablets (1,000 mg total) by mouth 2 (two) times daily. 09/21/20   Samella Parr, NP  levothyroxine (SYNTHROID) 175 MCG tablet Take 1 tablet by mouth daily. 05/20/19   [provider]  loperamide (IMODIUM) 2 MG capsule 1 PO Q4H PRN LOOSE STOOLS Patient taking differently: Take 4 mg by mouth every 4 (four) hours as needed. 1 PO Q4H PRN LOOSE STOOLS 09/05/19   Fields, Sandi L, MD  LORazepam (ATIVAN) 0.5 MG tablet Take 0.5 mg by mouth every 8 (eight) hours as needed for anxiety or sedation.    [provider]  meclizine (ANTIVERT) 25 MG tablet Take 1 tablet (25 mg total) by mouth every 8 (eight) hours as needed for dizziness. 10/16/20   Arnoldo Lenis, MD  Multiple Vitamin (MULTIVITAMIN WITH MINERALS) TABS tablet Take 1 tablet by mouth daily.    [provider]  ondansetron (ZOFRAN) 4 MG tablet TAKE 1 TABLET BY MOUTH FOUR TIMES DAILY AS NEEDED FOR NAUSEA Patient taking differently: Take 4 mg by mouth 4 (four) times daily as needed for nausea. 01/10/18   Annitta Needs, NP  OXYGEN 2 L daily.     [provider]  pantoprazole (PROTONIX) 40 MG tablet TAKE ONE TABLET BY MOUTH TWICE DAILY BEFORE APPLY MEAL Patient taking differently: Take 40 mg by mouth 2 (two) times daily. 02/07/17   Annitta Needs, NP  potassium chloride (KLOR-CON) 10 MEQ tablet Take 20 mEq by mouth daily.    [provider]  SM MELATONIN 3 MG TABS tablet Take 3 mg by mouth at bedtime as needed for sleep. 08/22/20   [provider]    Allergies    Citalopram hydrobromide, Codeine, Hydromorphone hcl, Metoclopramide, and Hyoscyamine  Review of Systems   Review of  Systems  All other systems reviewed and are negative.  Physical Exam Updated Vital Signs BP 121/80   Pulse (!) 103   Temp 97.9 F (36.6 C) (Oral)   Resp 19   Ht 1.676 m (_0 )   Wt 103 kg   SpO2 (!) 88%   BMI 36.64 kg/m   Physical Exam Constitutional:      Appearance: She is ill-appearing. She is  not diaphoretic.  HENT:     Head: Normocephalic and atraumatic.     Nose: Nose normal. No congestion or rhinorrhea.     Mouth/Throat:     Mouth: Mucous membranes are moist.     Pharynx: No oropharyngeal exudate or posterior oropharyngeal erythema.  Eyes:     General: No scleral icterus.       Right eye: No discharge.        Left eye: No discharge.     Pupils: Pupils are equal, round, and reactive to light.  Cardiovascular:     Rate and Rhythm: Normal rate and regular rhythm.     Heart sounds: No murmur heard. Pulmonary:     Effort: Respiratory distress present.     Breath sounds: Rales present. No wheezing.     Comments: Increased work of breathing, rales primarily at the left base, she is tachypneic, speaks in shortened sentences and is requiring 5 L of oxygen by nasal cannula as her 2 L by nasal cannula baseline leaves her at 80% Abdominal:     General: There is no distension.     Palpations: Abdomen is soft.     Tenderness: There is no abdominal tenderness.  Musculoskeletal:        General: No swelling or deformity.     Cervical back: Normal range of motion. No rigidity.     Right lower leg: No edema.     Left lower leg: No edema.  Lymphadenopathy:     Cervical: No cervical adenopathy.  Skin:    General: Skin is warm.     Findings: No erythema or rash.  Neurological:     Comments: The patient is awake alert and able to help sit up in the bed, she moves all 4 extremities with normal  Psychiatric:        Mood and Affect: Mood normal.    ED Results / Procedures / Treatments   Labs (all labs ordered are listed, but only abnormal results are displayed) Labs Reviewed  BRAIN NATRIURETIC PEPTIDE - Abnormal; Notable for the following components:      Result Value   B Natriuretic Peptide 453.0 (*)    All other components within normal limits  BLOOD GAS, ARTERIAL - Abnormal; Notable for the following components:   pCO2 arterial 68.2 (*)    pO2, Arterial 71.5 (*)     Bicarbonate 33.5 (*)    Acid-Base Excess 11.0 (*)    All other components within normal limits  CBC WITH DIFFERENTIAL/PLATELET - Abnormal; Notable for the following components:   RBC 3.28 (*)    Hemoglobin 9.5 (*)    HCT 32.4 (*)    MCHC 29.3 (*)    RDW 16.3 (*)    All other components within normal limits  COMPREHENSIVE METABOLIC PANEL - Abnormal; Notable for the following components:   Chloride 96 (*)    CO2 36 (*)    Calcium 8.4 (*)    Total Protein 6.2 (*)    Albumin 3.2 (*)    All other components within normal limits  TROPONIN I (HIGH SENSITIVITY) - Abnormal; Notable for the following components:   Troponin I (High Sensitivity) 113 (*)    All other components within normal limits  RESP PANEL BY RT-PCR (FLU A&B, COVID) ARPGX2  URINALYSIS, ROUTINE W REFLEX MICROSCOPIC  TROPONIN I (HIGH SENSITIVITY)    EKG EKG Interpretation  Date/Time:  Monday December 21 2020 09:38:54 EDT Ventricular Rate:  96 PR Interval:    QRS Duration: 116 QT Interval:  384 QTC Calculation: 473 R Axis:   -36 Text Interpretation: Atrial fibrillation Nonspecific IVCD with LAD Nonspecific T abnormalities, lateral leads since last tracing no significant change Confirmed by Noemi Chapel [id number]) on 12/21/2020 9:44:34 AM  Radiology DG Chest Port 1 View  Result Date: 12/21/2020 CLINICAL DATA:  Cough, shortness of breath EXAM: PORTABLE CHEST 1 VIEW COMPARISON:  11/21/2020 FINDINGS: Bilateral diffuse interstitial thickening. No focal consolidation. No pleural effusion or pneumothorax. Stable cardiomegaly. No acute osseous abnormality. IMPRESSION: Cardiomegaly with mild pulmonary vascular congestion. Electronically Signed   By: Kathreen Devoid   On: 12/21/2020 10:33    Procedures .Critical Care  Date/Time: 12/21/2020 11:47 AM Performed by: Noemi Chapel, MD Authorized by: Noemi Chapel, MD   Critical care provider statement:    Critical care time (minutes):  35   Critical care time was exclusive of:   Separately billable procedures and treating other patients and teaching time   Critical care was necessary to treat or prevent imminent or life-threatening deterioration of the following conditions:  Respiratory failure   Critical care was time spent personally by me on the following activities:  Blood draw for specimens, development of treatment plan with patient or surrogate, discussions with consultants, evaluation of patient's response to treatment, examination of patient, obtaining history from patient or surrogate, ordering and performing treatments and interventions, ordering and review of laboratory studies, ordering and review of radiographic studies, pulse oximetry, re-evaluation of patient's condition and review of old charts Procedure Name: Intubation Date/Time: 12/21/2020 3:50 PM Performed by: Noemi Chapel, MD Pre-anesthesia Checklist: Patient identified, Patient being monitored, Emergency Drugs available, Timeout performed and Suction available Oxygen Delivery Method: Non-rebreather mask Preoxygenation: Pre-oxygenation with 100% oxygen Induction Type: Rapid sequence Ventilation: Mask ventilation without difficulty Laryngoscope Size: Mac and 4 Grade View: Grade III Tube size: 7.5 mm Number of attempts: 1 Airway Equipment and Method: Stylet Placement Confirmation: ETT inserted through vocal cords under direct vision, CO2 detector and Breath sounds checked- equal and bilateral Secured at: 24 cm Tube secured with: ETT holder Dental Injury: Teeth and Oropharynx as per pre-operative assessment  Difficulty Due To: Difficulty was unanticipated Comments:        Medications Ordered in ED Medications  albuterol (PROVENTIL,VENTOLIN) solution continuous neb (has no administration in time range)  methylPREDNISolone sodium succinate (SOLU-MEDROL) 125 mg/2 mL injection 125 mg (has no administration in time range)  furosemide (LASIX) injection 40 mg (has no administration in time range)   aspirin chewable tablet 324 mg (324 mg Oral Given 12/21/20 1009)    ED Course  I have reviewed the triage vital signs and the nursing notes.  Pertinent labs & imaging results that were available during my care of the patient were reviewed by me and considered in my medical decision making (see chart for details).    MDM Rules/Calculators/A&P                     The patient is experiencing some acute on chronic respiratory failure.  She does have some abnormal lung sounds  on the left raising suspicion for either pneumonia, congestive heart failure or some other element.  Will need an ABG, chest x-ray, labs and an EKG.  The patient is agreeable to the plan.  This patient has an ABG showing some hypercapnia, some hypoxia, she is currently requiring 5 L by nasal cannula to be 90% on oxygen.  Chest x-ray shows some pulmonary congestion and edema, lab work shows a slightly elevated troponin just over 100, the patient is having no chest pain.  I have discussed her care with her daughter who states that she has been on and off of the ventilator over the last couple of years, she has intermittent respiratory failure.  At this time the patient will need to be placed on BiPAP due to worsening ABG, hypoxia requiring increasing levels of supplemental oxygen and will need to be admitted to the hospital.  She is critically ill with acute respiratory failure, there does not appear to be pneumonia, COVID test pending.  Care was discussed with Dr. Manuella Ghazi who has been kind enough to admit this patient to a high level of care  Pt continued to decompensate, she refused BiPAP, she became more more agitated and the discussion was had with the hospitalist and the family members regarding potential intubation and the need for life-saving measures at this point.  They agreed, the patient was intubated, see the note above, hospitalist will assume care.  Final Clinical Impression(s) / ED Diagnoses Final diagnoses:  Acute  on chronic respiratory failure with hypoxia and hypercapnia ([place])  Elevated troponin     Noemi Chapel, MD 12/21/20 1154    Noemi Chapel, MD 12/21/20 [phone]

## 2020-12-21 NOTE — ED Notes (Signed)
Breath sounds noted bilaterally

## 2020-12-21 NOTE — ED Notes (Signed)
Verbal order given to start propofol at 65mcg/hr

## 2020-12-21 NOTE — ED Notes (Signed)
Put pt on a pure wick. 

## 2020-12-21 NOTE — ED Notes (Signed)
Pt refusing to wear Bipap. Md made aware of pts status. Pt 85% on 5L . Ps heart rate increasing as well. MD aware

## 2020-12-21 NOTE — ED Triage Notes (Signed)
Patient woke this am with SOB. Labored breathing noted. Per EMS patient's sats were 55% in their arrival.

## 2020-12-21 NOTE — ED Notes (Signed)
Hyacinth Meeker, MD notified re: [name] 89, primary RN aware as well

## 2020-12-22 ENCOUNTER — Encounter (HOSPITAL_COMMUNITY): Payer: Self-pay | Admitting: Internal Medicine

## 2020-12-22 ENCOUNTER — Inpatient Hospital Stay (HOSPITAL_COMMUNITY): Payer: PPO

## 2020-12-22 ENCOUNTER — Inpatient Hospital Stay: Payer: Self-pay

## 2020-12-22 DIAGNOSIS — J9601 Acute respiratory failure with hypoxia: Secondary | ICD-10-CM

## 2020-12-22 DIAGNOSIS — Z7189 Other specified counseling: Secondary | ICD-10-CM

## 2020-12-22 DIAGNOSIS — J9602 Acute respiratory failure with hypercapnia: Secondary | ICD-10-CM

## 2020-12-22 DIAGNOSIS — Z515 Encounter for palliative care: Secondary | ICD-10-CM

## 2020-12-22 LAB — GLUCOSE, CAPILLARY
Glucose-Capillary: 271 mg/dL — ABNORMAL HIGH (ref 70–99)
Glucose-Capillary: 261 mg/dL — ABNORMAL HIGH (ref 70–99)
Glucose-Capillary: 189 mg/dL — ABNORMAL HIGH (ref 70–99)
Glucose-Capillary: 160 mg/dL — ABNORMAL HIGH (ref 70–99)
Glucose-Capillary: 185 mg/dL — ABNORMAL HIGH (ref 70–99)
Glucose-Capillary: 146 mg/dL — ABNORMAL HIGH (ref 70–99)

## 2020-12-22 LAB — CBC
HCT: 28.7 % — ABNORMAL LOW (ref 36.0–46.0)
Hemoglobin: 8.8 g/dL — ABNORMAL LOW (ref 12.0–15.0)
MCH: 28.4 pg (ref 26.0–34.0)
MCHC: 30.7 g/dL (ref 30.0–36.0)
MCV: 92.6 fL (ref 80.0–100.0)
Platelets: 182 10*3/uL (ref 150–400)
RBC: 3.1 MIL/uL — ABNORMAL LOW (ref 3.87–5.11)
RDW: 16.5 % — ABNORMAL HIGH (ref 11.5–15.5)
WBC: 7.6 10*3/uL (ref 4.0–10.5)
nRBC: 0 % (ref 0.0–0.2)

## 2020-12-22 LAB — BASIC METABOLIC PANEL
Anion gap: 11 (ref 5–15)
BUN: 22 mg/dL (ref 8–23)
CO2: 31 mmol/L (ref 22–32)
Calcium: 8.4 mg/dL — ABNORMAL LOW (ref 8.9–10.3)
Chloride: 100 mmol/L (ref 98–111)
Creatinine, Ser: 1.24 mg/dL — ABNORMAL HIGH (ref 0.44–1.00)
GFR, Estimated: 47 mL/min — ABNORMAL LOW (ref >60)
Glucose, Bld: 185 mg/dL — ABNORMAL HIGH (ref 70–99)
Potassium: 3.9 mmol/L (ref 3.5–5.1)
Sodium: 142 mmol/L (ref 135–145)

## 2020-12-22 LAB — MRSA NEXT GEN BY PCR, NASAL: MRSA by PCR Next Gen: NOT DETECTED

## 2020-12-22 LAB — TRIGLYCERIDES: Triglycerides: 29 mg/dL (ref <150)

## 2020-12-22 LAB — MAGNESIUM: Magnesium: 2.1 mg/dL (ref 1.7–2.4)

## 2020-12-22 MED ORDER — SODIUM CHLORIDE 0.9% FLUSH
10.0000 mL | Freq: Two times a day (BID) | INTRAVENOUS | Status: DC
Start: 2020-12-22 — End: 2020-12-30
  Administered 2020-12-22 – 2020-12-27 (×10): 10 mL
  Administered 2020-12-27: 20 mL
  Administered 2020-12-28 – 2020-12-30 (×4): 10 mL

## 2020-12-22 MED ORDER — VITAMIN D 25 MCG (1000 UNIT) PO TABS
2000.0000 [IU] | ORAL_TABLET | Freq: Every day | ORAL | Status: DC
  Administered 2020-12-22 – 2020-12-26 (×4): 2000 [IU]
  Filled 2020-12-22 (×4): qty 2

## 2020-12-22 MED ORDER — LORAZEPAM BOLUS VIA INFUSION: 1.0000 mg | INTRAVENOUS | Status: DC | PRN

## 2020-12-22 MED ORDER — ADULT MULTIVITAMIN LIQUID CH
15.0000 mL | Freq: Every day | ORAL | Status: DC
  Administered 2020-12-22 – 2020-12-26 (×4): 15 mL
  Filled 2020-12-22 (×4): qty 15

## 2020-12-22 MED ORDER — SODIUM CHLORIDE 0.9 % IV BOLUS
500.0000 mL | Freq: Once | INTRAVENOUS | Status: AC
  Administered 2020-12-22: 500 mL via INTRAVENOUS

## 2020-12-22 MED ORDER — SODIUM CHLORIDE 0.9% FLUSH: 10.0000 mL | INTRAVENOUS | Status: DC | PRN

## 2020-12-22 MED ORDER — POTASSIUM CHLORIDE 20 MEQ PO PACK
20.0000 meq | PACK | Freq: Every day | ORAL | Status: DC
  Administered 2020-12-22 – 2020-12-26 (×3): 20 meq
  Filled 2020-12-22 (×3): qty 1

## 2020-12-22 MED ORDER — NOREPINEPHRINE 4 MG/250ML-% IV SOLN
0.0000 ug/min | INTRAVENOUS | Status: DC
  Administered 2020-12-22: 2 ug/min via INTRAVENOUS
  Administered 2020-12-24: 4 ug/min via INTRAVENOUS
  Filled 2020-12-22: qty 250

## 2020-12-22 MED ORDER — ORAL CARE MOUTH RINSE
15.0000 mL | OROMUCOSAL | Status: DC
  Administered 2020-12-22 – 2020-12-23 (×12): 15 mL via OROMUCOSAL

## 2020-12-22 MED ORDER — CHLORHEXIDINE GLUCONATE 0.12% ORAL RINSE (MEDLINE KIT)
15.0000 mL | Freq: Two times a day (BID) | OROMUCOSAL | Status: DC
  Administered 2020-12-22 – 2020-12-28 (×12): 15 mL via OROMUCOSAL

## 2020-12-22 MED ORDER — ENOXAPARIN SODIUM 100 MG/ML IJ SOSY
100.0000 mg | PREFILLED_SYRINGE | Freq: Two times a day (BID) | INTRAMUSCULAR | Status: DC
  Administered 2020-12-22 – 2020-12-24 (×6): 100 mg via SUBCUTANEOUS
  Filled 2020-12-22 (×6): qty 1

## 2020-12-22 MED ORDER — LEVOTHYROXINE SODIUM 75 MCG PO TABS
175.0000 ug | ORAL_TABLET | Freq: Every day | ORAL | Status: DC
  Administered 2020-12-23 – 2020-12-26 (×2): 175 ug
  Filled 2020-12-22 (×2): qty 1

## 2020-12-22 MED ORDER — LORAZEPAM 2 MG/ML IJ SOLN
1.0000 mg | Freq: Once | INTRAMUSCULAR | Status: AC
  Administered 2020-12-22: 1 mg via INTRAVENOUS
  Filled 2020-12-22: qty 1

## 2020-12-22 MED ORDER — DONEPEZIL HCL 5 MG PO TABS
5.0000 mg | ORAL_TABLET | Freq: Every day | ORAL | Status: DC
  Administered 2020-12-22 – 2020-12-26 (×4): 5 mg
  Filled 2020-12-22 (×4): qty 1

## 2020-12-22 MED ORDER — AMIODARONE HCL 200 MG PO TABS
200.0000 mg | ORAL_TABLET | Freq: Every morning | ORAL | Status: DC
  Administered 2020-12-22 – 2020-12-26 (×4): 200 mg
  Filled 2020-12-22 (×4): qty 1

## 2020-12-22 MED ORDER — NOREPINEPHRINE 4 MG/250ML-% IV SOLN
INTRAVENOUS | Status: AC
  Filled 2020-12-22: qty 250

## 2020-12-22 MED ORDER — LORAZEPAM 2 MG/ML IJ SOLN: 1.0000 mg | INTRAMUSCULAR | Status: DC | PRN

## 2020-12-22 NOTE — Consult Note (Signed)
Consultation Note Date: 12/22/2020   Patient Name: [name]  DOB: 18-Apr-1951  MRN: [id number]  Age / Sex: 70 y.o., female  PCP: Tara Evens, MD Referring Physician: Rodena Goldmann, DO  Reason for Consultation: Establishing goals of care  HPI/Patient Profile: 70 y.o. female  with past medical history of DM2, obesity, chronic A. fib on Eliquis, dementia, D CHF/chronic hypoxemia/2 L [place], hypothyroid, seizure disorder, CKD 3, CAD/prior NSTEMI, HLD, GERD admitted on 12/21/2020 with acute respiratory failure/intubated.   Clinical Assessment and Goals of Care: I have reviewed medical records including EPIC notes, labs and imaging, received report from RN, assessed the patient and then met at the bedside along with daughter/HCPOA [name], to discuss diagnosis prognosis, GOC, EOL wishes, disposition and options.  Althought [name] is known to the palliative medicine team,  I introduced Palliative Medicine as specialized medical care for people living with serious illness. It focuses on providing relief from the symptoms and stress of a serious illness. The goal is to improve quality of life for both the patient and the family.  We discussed talk about [name]'s acute health concerns, intubation and ventilator support.  Daughter tells me about her discussion with CCM and the goal for having ventilator support for a few more days.  She tells me that the doctor tells her that it would be best to have [name] off the ventilator as soon as possible, but they are not ready to risk her not surviving.  Advanced directives, concepts specific to code status, were considered and discussed.  She remains full code/full scope.   Discussed the importance of continued conversation with family and the medical providers regarding overall plan of care and treatment options, ensuring decisions are within the context of  the patient's values and GOCs.    Questions and concerns were addressed.  Hard Choices booklet left for review. The family was encouraged to call with questions or concerns.  PMT will continue to support holistically.  Conference with bedside nursing staff and CCM attending related to patient condition and [place].  PMT to follow up 06/23   HCPOA NEXT OF KIN -daughter, [name]    SUMMARY OF RECOMMENDATIONS   Full scope/scope  Time for outcomes.    Code Status/Advance Care Planning: Full code  Symptom Management:  Per hospitalist/CCM, no additional needs at this time.  Palliative Prophylaxis:  Oral Care and Turn Reposition  Additional Recommendations (Limitations, Scope, Preferences): Full Scope Treatment  Psycho-social/Spiritual:  Desire for further Chaplaincy support:no Additional Recommendations: Caregiving  Support/Resources and ICU Family Guide  Prognosis:  Unable to determine, based on outcomes.  Guarded at this point.  Discharge Planning:  To be determined, based on outcomes.       Primary Diagnoses: Present on Admission:  Acute hypoxemic respiratory failure ([place])  Acute respiratory failure with hypoxia and hypercapnia ([place])   I have reviewed the medical record, interviewed the patient and family, and examined the patient. The following aspects are pertinent.  Past Medical History:  Diagnosis  Date   Abnormal pulmonary function test    Anemia    H/H of 10/30 with a normal MCV in 12/09   Anxiety    Arthritis    Barrett's esophagus    Diagnosed 1995. Last EGD 2016-NO BARRETT'S.    Chest pain    Negative cardiac catheterization in 2002; negative stress nuclear study in 2008   Chronic anticoagulation    Chronic combined systolic and diastolic CHF (congestive heart failure) ([place])    a. EF predominantly normal during prior echoes but was 40% during 10/2014 echo. b. Most recent 01/2015 EF normal, 55-60%.   Chronic LBP    Surgical intervention in 1996    Diabetes mellitus, type 2 (HCC)    Insulin therapy; exacerbated by prednisone   Dysrhythmia    AFib   Gastroparesis    99% retention 05/2008 on GES   GERD (gastroesophageal reflux disease)    Heart attack ([place]) 08/13/2020   Hiatal hernia    Hyperlipidemia    Hypertension    Hypothyroid    IBS (irritable bowel syndrome)    Obesity    OSA on CPAP    had CPAP and cannot tolerate.   Paroxysmal atrial fibrillation ([place])    Pulmonary hypertension ([place]) 01/2015   a. Predominantly pulmonary venous hypertension but may be component of PAH.   Seizures ([place])    last seizure was 2 years ago; on keppra for this; unknown etiology   Syncope    a. Admitted 05/2009; magnetic resonance imagin/ MRA - negative; etiology thought to be orthostasis secondary to drugs and dehydration. b. Syncope 02/2015 also felt 2/2 dehydration.   Social History   Socioeconomic History   Marital status: Married    Spouse name: Not on file   Number of children: Not on file   Years of education: Not on file   Highest education level: Not on file  Occupational History   Occupation: Museum/gallery curator at [employer]    Employer: [employer]: disabled    Employer: RETIRED  Tobacco Use   Smoking status: Former    Packs/day: 0.25    Years: 15.00    Pack years: 3.75    Types: Cigarettes    Start date: 02/26/1966    Quit date: 07/01/1983    Years since quitting: 37.5   Smokeless tobacco: Never   Tobacco comments:    quit in 1984  Vaping Use   Vaping Use: Never used  Substance and Sexual Activity   Alcohol use: No    Alcohol/week: 0.0 standard drinks   Drug use: No   Sexual activity: Yes    Birth control/protection: None, Surgical  Other Topics Concern   Not on file  Social History Narrative   Sedentary   4 children, "blended family"   Social Determinants of Health   Financial Resource Strain: Not on file  Food Insecurity: Not on file  Transportation Needs: Not on file  Physical Activity: Not on file   Stress: Not on file  Social Connections: Not on file   Family History  Problem Relation Age of Onset   Hypertension Mother    Alzheimer's disease Mother    Stroke Mother    Heart attack Mother    Hypertension Other    Breast cancer Sister    Heart disease Neg Hx    Colon cancer Neg Hx    Scheduled Meds:  amiodarone  200 mg Per Tube q morning   atorvastatin  80 mg Per Tube Daily  chlorhexidine gluconate (MEDLINE KIT)  15 mL Mouth Rinse BID   Chlorhexidine Gluconate Cloth  6 each Topical Daily   cholecalciferol  2,000 Units Per Tube Daily   docusate  100 mg Per Tube BID   donepezil  5 mg Per Tube Daily   DULoxetine  30 mg Oral BID   enoxaparin (LOVENOX) injection  100 mg Subcutaneous Q12H   feeding supplement  237 mL Oral TID BM   insulin aspart  0-15 Units Subcutaneous Q4H   [START ON 12/23/2020] levothyroxine  175 mcg Per Tube Daily   mouth rinse  15 mL Mouth Rinse 10 times per day   multivitamin  15 mL Per Tube Daily   pantoprazole (PROTONIX) IV  40 mg Intravenous Q12H   polyethylene glycol  17 g Per Tube Daily   potassium chloride  20 mEq Per Tube Daily   rocuronium  100 mg Intravenous Once   sodium chloride flush  3 mL Intravenous Q12H   Continuous Infusions:  sodium chloride Stopped (12/22/20 1010)   dexmedetomidine (PRECEDEX) IV infusion 1 mcg/kg/hr (12/22/20 1520)   fentaNYL infusion INTRAVENOUS 200 mcg/hr (12/22/20 1520)   levETIRAcetam Stopped (12/22/20 1028)   norepinephrine (LEVOPHED) Adult infusion 1 mcg/min (12/22/20 1520)   sodium chloride     PRN Meds:.sodium chloride, acetaminophen **OR** acetaminophen, albuterol, haloperidol lactate, ipratropium-albuterol, loperamide, meclizine, ondansetron **OR** ondansetron (ZOFRAN) IV, sodium chloride flush Medications Prior to Admission:  Prior to Admission medications   Medication Sig Start Date End Date Taking? Authorizing Provider  acetaminophen (TYLENOL) 500 MG tablet Take 500 mg by mouth as needed for moderate  pain or headache.    Yes [provider]  amiodarone (PACERONE) 200 MG tablet TAKE 1 TABLET BY MOUTH EVERY MORNING 03/03/20  Yes Strader, [place] M, PA-C  apixaban (ELIQUIS) 5 MG TABS tablet Take 1 tablet (5 mg total) by mouth 2 (two) times daily. 02/14/20  Yes BranchAlphonse Guild, MD  atorvastatin (LIPITOR) 80 MG tablet Place 1 tablet (80 mg total) into feeding tube daily. 09/22/20  Yes Samella Parr, NP  Cholecalciferol (VITAMIN D3) 2000 units capsule Take 2,000 Units by mouth daily. 10/25/17  Yes [provider]  DULoxetine (CYMBALTA) 30 MG capsule Take 30 mg by mouth 2 (two) times daily. 11/24/17  Yes [provider]  furosemide (LASIX) 20 MG tablet Take 1 tablet (20 mg total) by mouth daily. 09/22/20  Yes Samella Parr, NP  gabapentin (NEURONTIN) 100 MG capsule Take 1 capsule (100 mg total) by mouth 3 (three) times daily. 09/21/20  Yes Samella Parr, NP  insulin glargine (LANTUS) 100 UNIT/ML injection Inject 0.1 mLs (10 Units total) into the skin daily. Patient taking differently: Inject 20 Units into the skin daily. 09/21/20  Yes Samella Parr, NP  Lactase 9000 units CHEW 1 PO Q4H WHEN YOU EAT ICE CREAM OR CHEESE Patient taking differently: Chew 1 tablet by mouth every 4 (four) hours as needed (when eating dairy products). 09/05/19  Yes Fields, Marga Melnick, MD  levETIRAcetam (KEPPRA) 500 MG tablet Take 2 tablets (1,000 mg total) by mouth 2 (two) times daily. Patient taking differently: Take 1,000 mg by mouth daily. 09/21/20  Yes Samella Parr, NP  levothyroxine (SYNTHROID) 175 MCG tablet Take 1 tablet by mouth daily. 05/20/19  Yes [provider]  loperamide (IMODIUM) 2 MG capsule 1 PO Q4H PRN LOOSE STOOLS Patient taking differently: Take 4 mg by mouth every 4 (four) hours as needed. 1 PO Q4H PRN LOOSE STOOLS 09/05/19  Yes Fields, Marga Melnick, MD  LORazepam (ATIVAN) 0.5 MG tablet Take 0.5 mg by mouth every 8 (eight) hours as needed for anxiety or sedation.   Yes  [provider]  meclizine (ANTIVERT) 25 MG tablet Take 1 tablet (25 mg total) by mouth every 8 (eight) hours as needed for dizziness. 10/16/20  Yes BranchAlphonse Guild, MD  Multiple Vitamin (MULTIVITAMIN WITH MINERALS) TABS tablet Take 1 tablet by mouth daily.   Yes [provider]  ondansetron (ZOFRAN) 4 MG tablet TAKE 1 TABLET BY MOUTH FOUR TIMES DAILY AS NEEDED FOR NAUSEA Patient taking differently: Take 4 mg by mouth 4 (four) times daily as needed for nausea. 01/10/18  Yes Annitta Needs, NP  OXYGEN 2 L daily.    Yes [provider]  pantoprazole (PROTONIX) 40 MG tablet TAKE ONE TABLET BY MOUTH TWICE DAILY BEFORE APPLY MEAL Patient taking differently: Take 40 mg by mouth 2 (two) times daily. 02/07/17  Yes Annitta Needs, NP  potassium chloride (KLOR-CON) 10 MEQ tablet Take 20 mEq by mouth daily.   Yes [provider]  SM MELATONIN 3 MG TABS tablet Take 3 mg by mouth at bedtime as needed for sleep. 08/22/20  Yes [provider]  feeding supplement (ENSURE ENLIVE / ENSURE PLUS) LIQD Take 237 mLs by mouth 3 (three) times daily between meals. 09/21/20   Samella Parr, NP   Allergies  Allergen Reactions   Citalopram Hydrobromide Other (See Comments)    Dyskinesia Other reaction(s): Other (See Comments) Dyskinesia   Codeine Nausea And Vomiting and Other (See Comments)    HALLUCINATIONS Other reaction(s): Unknown Other reaction(s): Other (See Comments) HALLUCINATIONS    Hydromorphone Hcl Other (See Comments)    Made her pass out Other reaction(s): Other (See Comments) Made her pass out   Metoclopramide Other (See Comments)    DYSKINESIA Other reaction(s): Other (See Comments) DYSKINESIA   Hyoscyamine     MADE DIARRHEA WORSE   Review of Systems  Unable to perform ROS: Intubated   Physical Exam Vitals and nursing note reviewed.  Constitutional:      General: She is not in acute distress.    Appearance: She is obese. She is ill-appearing.      Interventions: She is intubated.  Pulmonary:     Effort: She is intubated.    Vital Signs: BP 115/61   Pulse 68   Temp 98.96 F (37.2 C)   Resp 18   Ht '5\' 6"'  (1.676 m)   Wt 103.5 kg   SpO2 (!) 89%   BMI 36.83 kg/m  Pain Scale: CPOT   Pain Score: 0-No pain   SpO2: SpO2: (!) 89 % O2 Device:SpO2: (!) 89 % O2 Flow Rate: .O2 Flow Rate (L/min): 5 L/min  IO: Intake/output summary:  Intake/Output Summary (Last 24 hours) at 12/22/2020 1522 Last data filed at 12/22/2020 1520 Gross per 24 hour  Intake 1343.94 ml  Output 1800 ml  Net -456.06 ml    LBM: Last BM Date:  (PTA - miralax / colace given) Baseline Weight: Weight: 103 kg Most recent weight: Weight: 103.5 kg     Palliative Assessment/Data:   Flowsheet Rows    Flowsheet Row Most Recent Value  Intake Tab   Referral Department Hospitalist  Unit at Time of Referral ICU  Palliative Care Primary Diagnosis Pulmonary  Date Notified 12/22/20  Palliative Care Type Return patient Palliative Care  Reason for referral Clarify Goals of Care  Date of Admission 12/21/20  Date  first seen by Palliative Care 12/22/20  # of days Palliative referral response time 0 Day(s)  # of days IP prior to Palliative referral 1  Clinical Assessment   Palliative Performance Scale Score 20%  Pain Max last 24 hours Not able to report  Pain Min Last 24 hours Not able to report  Dyspnea Max Last 24 Hours Not able to report  Dyspnea Min Last 24 hours Not able to report  Psychosocial & Spiritual Assessment   Palliative Care Outcomes        Time In: 1450 Time Out: 1600 Time Total: 70 minutes Greater than 50%  of this time was spent counseling and coordinating care related to the above assessment and plan.  Signed by: Drue Novel, NP   Please contact Palliative Medicine Team phone at [phone] for questions and concerns.  For individual provider: See Shea Evans

## 2020-12-22 NOTE — Progress Notes (Signed)
Peripherally Inserted Central Catheter Placement  The IV Nurse has discussed with the patient and/or persons authorized to consent for the patient, the purpose of this procedure and the potential benefits and risks involved with this procedure.  The benefits include less needle sticks, lab draws from the catheter, and the patient may be discharged home with the catheter. Risks include, but not limited to, infection, bleeding, blood clot (thrombus formation), and puncture of an artery; nerve damage and irregular heartbeat and possibility to perform a PICC exchange if needed/ordered by physician.  Alternatives to this procedure were also discussed.  Bard Power PICC patient education guide, fact sheet on infection prevention and patient information card has been provided to patient /or left at bedside. Consent obtained from Daughter [name] Sibert) at bedside.    PICC Placement Documentation  PICC Double Lumen 12/22/20 PICC Right Basilic 40 cm 0 cm (Active)  Indication for Insertion or Continuance of Line Vasoactive infusions 12/22/20 1452  Exposed Catheter (cm) 0 cm 12/22/20 1452  Site Assessment Clean;Dry;Intact 12/22/20 1452  Lumen #1 Status Flushed;Saline locked;Blood return noted 12/22/20 1452  Lumen #2 Status Flushed;Saline locked;Blood return noted 12/22/20 1452  Dressing Type Transparent;Securing device 12/22/20 1452  Dressing Status Clean;Dry;Intact 12/22/20 1452  Antimicrobial disc in place? Yes 12/22/20 1452  Safety Lock Not Applicable 12/22/20 1452  Line Care Connections checked and tightened 12/22/20 1452  Dressing Intervention New dressing 12/22/20 1452  Dressing Change Due 12/29/20 12/22/20 1452       Bing Plume 12/22/2020, 2:53 PM

## 2020-12-22 NOTE — Progress Notes (Signed)
NAME:  [name], MRN:  [id number], DOB:  [birth year], LOS: 1 ADMISSION DATE:  12/21/2020, CONSULTATION DATE:  6/20 REFERRING MD:  Manuella Ghazi, [place], CHIEF COMPLAINT:  sob   History of Present Illness:  72  yowf quit smoking 1984 (s airflow obst a recently as 01/2016 pft) with medical history significant for obesity, type 2 diabetes, chronic atrial fibrillation on Eliquis, dementia, diastolic CHF with chronic hypoxemia on 2 L nasal cannula, hypothyroidism, seizure disorder, CKD stage III, CAD with prior NSTEMI, dyslipidemia, and GERD who presented to the ED with worsening shortness of breath over the past couple days.  Daughter at bedside states that she has had approximately 20 pound weight gain noted as well over the same time frame.  Her symptoms were worsening and EMS was called and upon arrival she was noted to have pulse oximetry of 55%.  She was noted to be taking her medications normally according to the daughter and was wearing her home oxygen.  She denied any chest pain, cough, fevers, or chills.  Some mild lower extremity edema has been noted as well.  No other sick contacts and she has had her COVID vaccines and booster shots.  She appears to have been admitted just last month with acute on chronic diastolic CHF.  Her discharge weight at that time was 214 pounds.  She is currently at 227 pounds.   ED Course: Vital signs with some mild tachycardia noted with atrial fibrillation on EKG and ABG with PCO2 68.2 and PO2 of 71.5.  Troponin of 113.  BNP 453.  Chest x-ray with some cardiomegaly and pulmonary vascular congestion noted.  COVID testing pending.  She has been started on some Solu-Medrol as well as Lasix twice daily.  She is currently on BiPAP 40% FiO2.   Baseline per daughter = mostly housbound on 2lpm 24/7 with baseline dementia but worse than usual x several weeks assoc with wt gain, no cough or fever or cp / most the swelling is in her abd not legs    Pertinent  Medical History      Significant Hospital Events: Including procedures, antibiotic start and stop dates in addition to other pertinent events   ET 6/20    Scheduled Meds:  amiodarone  200 mg Per Tube q morning   atorvastatin  80 mg Per Tube Daily   chlorhexidine gluconate (MEDLINE KIT)  15 mL Mouth Rinse BID   Chlorhexidine Gluconate Cloth  6 each Topical Daily   cholecalciferol  2,000 Units Per Tube Daily   docusate  100 mg Per Tube BID   donepezil  5 mg Per Tube Daily   DULoxetine  30 mg Oral BID   enoxaparin (LOVENOX) injection  100 mg Subcutaneous Q12H   feeding supplement  237 mL Oral TID BM   insulin aspart  0-15 Units Subcutaneous Q4H   [START ON 12/23/2020] levothyroxine  175 mcg Per Tube Daily   mouth rinse  15 mL Mouth Rinse 10 times per day   multivitamin  15 mL Per Tube Daily   pantoprazole (PROTONIX) IV  40 mg Intravenous Q12H   polyethylene glycol  17 g Per Tube Daily   potassium chloride  20 mEq Per Tube Daily   rocuronium  100 mg Intravenous Once   sodium chloride flush  3 mL Intravenous Q12H   Continuous Infusions:  sodium chloride Stopped (12/22/20 1010)   dexmedetomidine (PRECEDEX) IV infusion 0.8 mcg/kg/hr (12/22/20 1011)   fentaNYL infusion INTRAVENOUS 200 mcg/hr (12/22/20 1011)  levETIRAcetam 1,000 mg (12/22/20 1010)   PRN Meds:.sodium chloride, acetaminophen **OR** acetaminophen, albuterol, haloperidol lactate, ipratropium-albuterol, loperamide, meclizine, ondansetron **OR** ondansetron (ZOFRAN) IV, sodium chloride flush    Interim History / Subjective:  Bp soft with sedation on precedex/ fentanyl but tol 4 h PSV wean this am per nursing   Objective   Blood pressure (!) 81/39, pulse (!) 57, temperature 99.14 F (37.3 C), resp. rate 15, height _0  (1.676 m), weight 103.5 kg, SpO2 100 %.    Vent Mode: CPAP;PSV FiO2 (%):  [40 %-100 %] 50 % Set Rate:  [18 bmp] 18 bmp Vt Set:  [400 mL-470 mL] 470 mL PEEP:  [5 cmH20] 5 cmH20 Pressure Support:  [14 cmH20] 14  cmH20 Plateau Pressure:  [22 cmH20-28 cmH20] 23 cmH20   Intake/Output Summary (Last 24 hours) at 12/22/2020 1022 Last data filed at 12/22/2020 1011 Gross per 24 hour  Intake 1019.28 ml  Output 875 ml  Net 144.28 ml   Filed Weights   12/21/20 0934 12/21/20 2206  Weight: 103 kg 103.5 kg    Examination:  Very uncomfortable when not sedated  Tmax 99.5 General appearance:    ederly wf sedated on vent   At Rest 02 sats  99% on 0.45 FIO2/ 5 pped  No jvd Oropharynx clear,  mucosa nl Neck supple Lungs with a few scattered exp > insp rhonchi bilaterally RRR no s3 or or sign murmur Abd obese with limited excursion  Extr warm with no edema or clubbing noted Neuro  Sensorium sedated but moving all 4 spont,  no apparent motor deficits          Assessment & Plan:  Acute on chronic hypercarbic and hypoxemic resp failure with biventricular chf but only mild pulmonary edema on cxr suggesting restrictive changes  (documented by pfts 12/05/17) or chf causing decompensation (she has massive CM, kyphosis, and obesity contributing to restriction and none of these components are reversible in the foreseeable future) >>>  very limited options here given pt is not a good candidate at all for NIV and would likely need to be sedated to tolerate any kind of mask >>> palliative care consult for EOL discussion (one option is trach/LTAC but I don't believe that is in pt's best interest and would push for comfort care.   AMS c/w TME likely related to acute on chronic hypercabia/ C02 narcosis  >>> best sedation probably precedex in this setting  and low dose fentanyl for air hunger  Underlying dementia very challenging in terms of addressing her long term outlook and for hypercabia as I strongly doubt she would be compliant with NIV  Relative hypovolemia presently making her bp borderline in setting of cor pulmonale (WHO III) with Left heart failure (WHO II) PHJ >>> repeat NS 500 cc to get off the levophed -  she if very sensitive to fentanyl which is typical of a relatively hypovolemic chronic chf/ cor pulmonale setting     Labs   CBC: Recent Labs  Lab 12/21/20 0953 12/22/20 0652  WBC 5.7 7.6  NEUTROABS 3.6  --   HGB 9.5* 8.8*  HCT 32.4* 28.7*  MCV 98.8 92.6  PLT 221 027    Basic Metabolic Panel: Recent Labs  Lab 12/21/20 0953 12/22/20 0652  NA 140 142  K 3.9 3.9  CL 96* 100  CO2 36* 31  GLUCOSE 88 185*  BUN 15 22  CREATININE 0.98 1.24*  CALCIUM 8.4* 8.4*  MG  --  2.1   GFR:  Estimated Creatinine Clearance: 50.6 mL/min (A) (by C-G formula based on SCr of 1.24 mg/dL (H)). Recent Labs  Lab 12/21/20 0953 12/22/20 0652  WBC 5.7 7.6    Liver Function Tests: Recent Labs  Lab 12/21/20 0953  AST 17  ALT 13  ALKPHOS 56  BILITOT 0.4  PROT 6.2*  ALBUMIN 3.2*   No results for input(s): LIPASE, AMYLASE in the last 168 hours. No results for input(s): AMMONIA in the last 168 hours.  ABG    Component Value Date/Time   PHART 7.386 12/21/2020 1539   PCO2ART 55.0 (H) 12/21/2020 1539   PO2ART 235 (H) 12/21/2020 1539   HCO3 30.5 (H) 12/21/2020 1539   TCO2 27 09/06/2020 1238   ACIDBASEDEF 1.0 01/26/2015 1132   O2SAT 99.4 12/21/2020 1539     Coagulation Profile: No results for input(s): INR, PROTIME in the last 168 hours.  Cardiac Enzymes: No results for input(s): CKTOTAL, CKMB, CKMBINDEX, TROPONINI in the last 168 hours.  HbA1C: Hgb A1c MFr Bld  Date/Time Value Ref Range Status  11/22/2020 04:03 AM 7.6 (H) 4.8 - 5.6 % Final    Comment:    (NOTE) Pre diabetes:          5.7%-6.4%  Diabetes:              >6.4%  Glycemic control for   <7.0% adults with diabetes   08/24/2020 10:02 AM 7.9 (H) 4.8 - 5.6 % Final    Comment:    (NOTE) Pre diabetes:          5.7%-6.4%  Diabetes:              >6.4%  Glycemic control for   <7.0% adults with diabetes     CBG: Recent Labs  Lab 12/21/20 1652 12/22/20 0124 12/22/20 0355 12/22/20 0812  GLUCAP 157* 271*  261* 189*       The patient is critically ill with multiple organ systems failure and requires high complexity decision making for assessment and support, frequent evaluation and titration of therapies, application of advanced monitoring technologies and extensive interpretation of multiple databases. Critical Care Time devoted to patient care services described in this note is 40 minutes.     Christinia Gully, MD Pulmonary and [place] [phone]   After 7:00 pm call Elink  [phone]

## 2020-12-22 NOTE — Progress Notes (Signed)
Pt having continual low BP's throughout the morning, despite sedation weaning. MD made aware, order to start Levophed. Will continue to monitor.

## 2020-12-22 NOTE — Plan of Care (Signed)

## 2020-12-22 NOTE — Progress Notes (Signed)
PROGRESS NOTE    [name]  QVZ:[id number] DOB: December 30, 1950 DOA: 12/21/2020 PCP: Lemmie Evens, MD   Brief Narrative:   [name] is a 70 y.o. female with medical history significant for obesity, type 2 diabetes, chronic atrial fibrillation on Eliquis, dementia, diastolic CHF with chronic hypoxemia on 2 L nasal cannula, hypothyroidism, seizure disorder, CKD stage III, CAD with prior NSTEMI, dyslipidemia, and GERD who presented to the ED with worsening shortness of breath over the past couple days.  She was admitted with acute on chronic hypoxemic and hypercarbic respiratory failure in the setting of CHF exacerbation.  BiPAP was initially attempted, but she could not tolerate this and therefore was intubated.  PCCM now following for ventilator management.  She is having hypotension requiring pressor use.  Assessment & Plan:   Active Problems:   Acute respiratory failure with hypoxia and hypercapnia (HCC)   Acute hypoxemic respiratory failure (HCC)   Acute on chronic hypoxemic and hypercarbic respiratory failure -Continues to remain intubated, PCCM has suggested palliative consultation -Has had prior intubations and tracheostomy -Noted to have pulmonary arterial hypertension on prior CT 11/2020 -COVID testing negative; vaccinated and boosted -IV Lasix twice daily currently held due to hypotension -Monitor in ICU   Acute on chronic diastolic CHF exacerbation -Recent 2D echocardiogram 5/22 with LVEF 55-60% and indeterminate diastolic parameters -Holding IV Lasix at this time due to hypotension -Receiving IV fluid boluses on account of this -Strict I's and O's -Monitor daily weights -Repeat a.m. labs  Hypotension likely related to sedation -Trial of fluid bolus -Started on norepinephrine and PICC line placed 6/21   Elevated troponin -Likely secondary to above -Continue to trend and monitor   Chronic atrial fibrillation-currently rate controlled -Continue amiodarone and  full dose Lovenox -Monitor on telemetry   Type 2 diabetes -Recent hemoglobin A1c 7.6% -Currently n.p.o. -SSI   AKI on CKD stage IIIa -Continue to monitor -Fluid bolus given -We will start on maintenance IV fluid   CAD with prior NSTEMI/dyslipidemia -Continue statin   Anemia of chronic disease -Baseline hemoglobin near 10, transfuse for hemoglobin less than 7 -Current hemoglobin 8.8, repeat CBC in a.m.   OSA/OHS -Patient intolerant to CPAP   Hypothyroidism -Recent TSH 0.526 -Continue Synthroid per tube   History of seizures -Continue Keppra, now IV   History of dementia -Continue Aricept   Stage I sacral ulcer -Present on admission -Wound care   Obesity -BMI 36.64 -Lifestyle changes outpatient   DVT prophylaxis: Full dose Lovenox Code Status: Full Family Communication: Daughter, [name] at bedside 6/21 Disposition Plan:  Status is: Inpatient  Remains inpatient appropriate because:Hemodynamically unstable, IV treatments appropriate due to intensity of illness or inability to take PO, and Inpatient level of care appropriate due to severity of illness  Dispo: The patient is from: Home              Anticipated d/c is to: SNF              Patient currently is not medically stable to d/c.   Difficult to place patient No  Skin Assessment:  I have examined the patient's skin and I agree with the wound assessment as performed by the wound care RN as outlined below:  Pressure Injury 11/21/20 Sacrum Medial Stage 1 -  Intact skin with non-blanchable redness of a localized area usually over a bony prominence. Stage 1 pressure Ulcer non blanchable (Active)  11/21/20 2259  Location: Sacrum  Location Orientation: Medial  Staging: Stage 1 -  Intact skin with non-blanchable redness of a localized area usually over a bony prominence.  Wound Description (Comments): Stage 1 pressure Ulcer non blanchable  Present on Admission: Yes    Consultants:   PCCM Palliative  Procedures:  Intubation 6/20 PICC line 6/21 See below  Antimicrobials:  Anti-infectives (From admission, onward)    None       Subjective: Patient seen and evaluated today and is sedated on the ventilator.  Noted to have some agitation overnight requiring additional sedation and fentanyl drip was initiated.  Objective: Vitals:   12/22/20 1315 12/22/20 1330 12/22/20 1345 12/22/20 1400  BP: (!) 142/65 (!) 108/93  (!) 113/57  Pulse: 65 68 67 64  Resp:      Temp: 98.42 F (36.9 C) 98.42 F (36.9 C) 98.6 F (37 C) 98.6 F (37 C)  TempSrc:      SpO2: 100% 96% 91% 98%  Weight:      Height:        Intake/Output Summary (Last 24 hours) at 12/22/2020 1506 Last data filed at 12/22/2020 1301 Gross per 24 hour  Intake 1228.9 ml  Output 875 ml  Net 353.9 ml   Filed Weights   12/21/20 0934 12/21/20 2206  Weight: 103 kg 103.5 kg    Examination:  General exam: Appears sedated  Respiratory system: Clear to auscultation. Respiratory effort normal.  Intubated on FiO2 50% Cardiovascular system: S1 & S2 heard, RRR.  Gastrointestinal system: Abdomen is soft Central nervous system: Sedated Extremities: No edema Skin: No significant lesions noted Psychiatry: Cannot be assessed    Data Reviewed: I have personally reviewed following labs and imaging studies  CBC: Recent Labs  Lab 12/21/20 0953 12/22/20 0652  WBC 5.7 7.6  NEUTROABS 3.6  --   HGB 9.5* 8.8*  HCT 32.4* 28.7*  MCV 98.8 92.6  PLT 221 443   Basic Metabolic Panel: Recent Labs  Lab 12/21/20 0953 12/22/20 0652  NA 140 142  K 3.9 3.9  CL 96* 100  CO2 36* 31  GLUCOSE 88 185*  BUN 15 22  CREATININE 0.98 1.24*  CALCIUM 8.4* 8.4*  MG  --  2.1   GFR: Estimated Creatinine Clearance: 50.6 mL/min (A) (by C-G formula based on SCr of 1.24 mg/dL (H)). Liver Function Tests: Recent Labs  Lab 12/21/20 0953  AST 17  ALT 13  ALKPHOS 56  BILITOT 0.4  PROT 6.2*  ALBUMIN 3.2*   No  results for input(s): LIPASE, AMYLASE in the last 168 hours. No results for input(s): AMMONIA in the last 168 hours. Coagulation Profile: No results for input(s): INR, PROTIME in the last 168 hours. Cardiac Enzymes: No results for input(s): CKTOTAL, CKMB, CKMBINDEX, TROPONINI in the last 168 hours. BNP (last 3 results) No results for input(s): PROBNP in the last 8760 hours. HbA1C: No results for input(s): HGBA1C in the last 72 hours. CBG: Recent Labs  Lab 12/21/20 1652 12/22/20 0124 12/22/20 0355 12/22/20 0812 12/22/20 1127  GLUCAP 157* 271* 261* 189* 160*   Lipid Profile: Recent Labs    12/22/20 0652  TRIG 29   Thyroid Function Tests: No results for input(s): TSH, T4TOTAL, FREET4, T3FREE, THYROIDAB in the last 72 hours. Anemia Panel: No results for input(s): VITAMINB12, FOLATE, FERRITIN, TIBC, IRON, RETICCTPCT in the last 72 hours. Sepsis Labs: No results for input(s): PROCALCITON, LATICACIDVEN in the last 168 hours.  Recent Results (from the past 240 hour(s))  MRSA Next Gen by PCR, Nasal     Status: None  Collection Time: 12/21/20  9:20 AM   Specimen: Nasal Mucosa; Nasal Swab  Result Value Ref Range Status   MRSA by PCR Next Gen NOT DETECTED NOT DETECTED Final    Comment: (NOTE) The GeneXpert MRSA Assay (FDA approved for NASAL specimens only), is one component of a comprehensive MRSA colonization surveillance program. It is not intended to diagnose MRSA infection nor to guide or monitor treatment for MRSA infections. Test performance is not FDA approved in patients less than 19 years old. Performed at [facility], [address], [place], [place] [address]   Resp Panel by RT-PCR (Flu A&B, Covid) Nasopharyngeal Swab     Status: None   Collection Time: 12/21/20 11:45 AM   Specimen: Nasopharyngeal Swab; Nasopharyngeal(NP) swabs in vial transport medium  Result Value Ref Range Status   SARS Coronavirus 2 by RT PCR NEGATIVE NEGATIVE Final    Comment:  (NOTE) SARS-CoV-2 target nucleic acids are NOT DETECTED.  The SARS-CoV-2 RNA is generally detectable in upper respiratory specimens during the acute phase of infection. The lowest concentration of SARS-CoV-2 viral copies this assay can detect is 138 copies/mL. A negative result does not preclude SARS-Cov-2 infection and should not be used as the sole basis for treatment or other patient management decisions. A negative result may occur with  improper specimen collection/handling, submission of specimen other than nasopharyngeal swab, presence of viral mutation(s) within the areas targeted by this assay, and inadequate number of viral copies(<138 copies/mL). A negative result must be combined with clinical observations, patient history, and epidemiological information. The expected result is Negative.  Fact Sheet for Patients:  EntrepreneurPulse.com.au  Fact Sheet for Healthcare Providers:  IncredibleEmployment.be  This test is no t yet approved or cleared by the [place] FDA and  has been authorized for detection and/or diagnosis of SARS-CoV-2 by FDA under an Emergency Use Authorization (EUA). This EUA will remain  in effect (meaning this test can be used) for the duration of the COVID-19 declaration under Section 564(b)(1) of the Act, 21 U.S.C.section 360bbb-3(b)(1), unless the authorization is terminated  or revoked sooner.       Influenza A by PCR NEGATIVE NEGATIVE Final   Influenza B by PCR NEGATIVE NEGATIVE Final    Comment: (NOTE) The Xpert Xpress SARS-CoV-2/FLU/RSV plus assay is intended as an aid in the diagnosis of influenza from Nasopharyngeal swab specimens and should not be used as a sole basis for treatment. Nasal washings and aspirates are unacceptable for Xpert Xpress SARS-CoV-2/FLU/RSV testing.  Fact Sheet for Patients: EntrepreneurPulse.com.au  Fact Sheet for Healthcare  Providers: IncredibleEmployment.be  This test is not yet approved or cleared by the [place] FDA and has been authorized for detection and/or diagnosis of SARS-CoV-2 by FDA under an Emergency Use Authorization (EUA). This EUA will remain in effect (meaning this test can be used) for the duration of the COVID-19 declaration under Section 564(b)(1) of the Act, 21 U.S.C. section 360bbb-3(b)(1), unless the authorization is terminated or revoked.  Performed at [facility], [address], [place], [place] [address]          Radiology Studies: Portable Chest x-ray  Result Date: 12/21/2020 CLINICAL DATA:  Intubation.  Short of breath. EXAM: PORTABLE CHEST 1 VIEW COMPARISON:  12/21/2020 at 10 a.m. FINDINGS: Endotracheal tube tip projects 2.4 cm above the carina. Nasal/orogastric tube passes well below the diaphragm, curling within the stomach, tip below the included field of view. Cardiac silhouette is enlarged. There is persistent vascular congestion with bilateral interstitial opacities. No  new lung abnormalities. IMPRESSION: 1. Well-positioned endotracheal tube and nasal/orogastric tube. 2. No change in the appearance of the lungs or cardiomegaly. Suspect congestive heart failure. Electronically Signed   By: Lajean Manes M.D.   On: 12/21/2020 16:20   DG Chest Port 1 View  Result Date: 12/21/2020 CLINICAL DATA:  Cough, shortness of breath EXAM: PORTABLE CHEST 1 VIEW COMPARISON:  11/21/2020 FINDINGS: Bilateral diffuse interstitial thickening. No focal consolidation. No pleural effusion or pneumothorax. Stable cardiomegaly. No acute osseous abnormality. IMPRESSION: Cardiomegaly with mild pulmonary vascular congestion. Electronically Signed   By: Kathreen Devoid   On: 12/21/2020 10:33   [place] EKG SITE RITE  Result Date: 12/22/2020 If Site Rite image not attached, placement could not be confirmed due to current cardiac rhythm.       Scheduled Meds:  amiodarone  200 mg Per  Tube q morning   atorvastatin  80 mg Per Tube Daily   chlorhexidine gluconate (MEDLINE KIT)  15 mL Mouth Rinse BID   Chlorhexidine Gluconate Cloth  6 each Topical Daily   cholecalciferol  2,000 Units Per Tube Daily   docusate  100 mg Per Tube BID   donepezil  5 mg Per Tube Daily   DULoxetine  30 mg Oral BID   enoxaparin (LOVENOX) injection  100 mg Subcutaneous Q12H   feeding supplement  237 mL Oral TID BM   insulin aspart  0-15 Units Subcutaneous Q4H   [START ON 12/23/2020] levothyroxine  175 mcg Per Tube Daily   mouth rinse  15 mL Mouth Rinse 10 times per day   multivitamin  15 mL Per Tube Daily   pantoprazole (PROTONIX) IV  40 mg Intravenous Q12H   polyethylene glycol  17 g Per Tube Daily   potassium chloride  20 mEq Per Tube Daily   rocuronium  100 mg Intravenous Once   sodium chloride flush  3 mL Intravenous Q12H   Continuous Infusions:  sodium chloride Stopped (12/22/20 1010)   dexmedetomidine (PRECEDEX) IV infusion 1 mcg/kg/hr (12/22/20 1400)   fentaNYL infusion INTRAVENOUS 150 mcg/hr (12/22/20 1301)   levETIRAcetam Stopped (12/22/20 1028)   norepinephrine (LEVOPHED) Adult infusion 3 mcg/min (12/22/20 1301)   sodium chloride       LOS: 1 day    Time spent: 35 minutes    Jahmil Macleod Darleen Crocker, DO [facility]  If 7PM-7AM, please contact night-coverage [email or web address] 12/22/2020, 3:06 PM

## 2020-12-22 NOTE — Progress Notes (Signed)
ANTICOAGULATION CONSULT NOTE - Initial Consult  Pharmacy Consult for Lovenox (Apixaban on hold) Indication: atrial fibrillation  Allergies  Allergen Reactions   Citalopram Hydrobromide Other (See Comments)    Dyskinesia Other reaction(s): Other (See Comments) Dyskinesia   Codeine Nausea And Vomiting and Other (See Comments)    HALLUCINATIONS Other reaction(s): Unknown Other reaction(s): Other (See Comments) HALLUCINATIONS    Hydromorphone Hcl Other (See Comments)    Made her pass out Other reaction(s): Other (See Comments) Made her pass out   Metoclopramide Other (See Comments)    DYSKINESIA Other reaction(s): Other (See Comments) DYSKINESIA   Hyoscyamine     MADE DIARRHEA WORSE    Patient Measurements: Height: 5\' 6"  (167.6 cm) Weight: 103.5 kg (228 lb 2.8 oz) IBW/kg (Calculated) : 59.3   Vital Signs: Temp: 98.6 F (37 C) (06/21 0015) BP: 120/75 (06/21 0015) Pulse Rate: 70 (06/21 0015)  Labs: Recent Labs    12/21/20 0953 12/21/20 1157  HGB 9.5*  --   HCT 32.4*  --   PLT 221  --   CREATININE 0.98  --   TROPONINIHS 113* 105*    Estimated Creatinine Clearance: 64 mL/min (by C-G formula based on SCr of 0.98 mg/dL).   Medical History: Past Medical History:  Diagnosis Date   Abnormal pulmonary function test    Anemia    H/H of 10/30 with a normal MCV in 12/09   Anxiety    Arthritis    Barrett's esophagus    Diagnosed 1995. Last EGD 2016-NO BARRETT'S.    Chest pain    Negative cardiac catheterization in 2002; negative stress nuclear study in 2008   Chronic anticoagulation    Chronic combined systolic and diastolic CHF (congestive heart failure) (HCC)    a. EF predominantly normal during prior echoes but was 40% during 10/2014 echo. b. Most recent 01/2015 EF normal, 55-60%.   Chronic LBP    Surgical intervention in 1996   Diabetes mellitus, type 2 (HCC)    Insulin therapy; exacerbated by prednisone   Dysrhythmia    AFib   Gastroparesis    99%  retention 05/2008 on GES   GERD (gastroesophageal reflux disease)    Heart attack (HCC) 08/13/2020   Hiatal hernia    Hyperlipidemia    Hypertension    Hypothyroid    IBS (irritable bowel syndrome)    Obesity    OSA on CPAP    had CPAP and cannot tolerate.   Paroxysmal atrial fibrillation (HCC)    Pulmonary hypertension (HCC) 01/2015   a. Predominantly pulmonary venous hypertension but may be component of PAH.   Seizures (HCC)    last seizure was 2 years ago; on keppra for this; unknown etiology   Syncope    a. Admitted 05/2009; magnetic resonance imagin/ MRA - negative; etiology thought to be orthostasis secondary to drugs and dehydration. b. Syncope 02/2015 also felt 2/2 dehydration.     Assessment: 70 y/o F with respiratory failure requiring intubation, on Apixaban PTA for Afib, holding Apixaban and starting Lovenox, last dose of Apixaban was >12 hours ago, ok to start Lovenox now, Hgb 9.5, plts good, renal function good.  Goal of Therapy:  Monitor platelets by anticoagulation protocol: Yes   Plan:  Lovenox 1 mg/kg subcutaneous q12h Daily CBC Monitor for bleeding Would check LMWH level with extended use  62, PharmD, BCPS Clinical Pharmacist Phone: [phone]

## 2020-12-23 ENCOUNTER — Other Ambulatory Visit (HOSPITAL_COMMUNITY): Payer: PPO

## 2020-12-23 LAB — CBC
HCT: 30.3 % — ABNORMAL LOW (ref 36.0–46.0)
Hemoglobin: 9.4 g/dL — ABNORMAL LOW (ref 12.0–15.0)
MCH: 28.8 pg (ref 26.0–34.0)
MCHC: 31 g/dL (ref 30.0–36.0)
MCV: 92.9 fL (ref 80.0–100.0)
Platelets: 219 10*3/uL (ref 150–400)
RBC: 3.26 MIL/uL — ABNORMAL LOW (ref 3.87–5.11)
RDW: 16.8 % — ABNORMAL HIGH (ref 11.5–15.5)
WBC: 10.4 10*3/uL (ref 4.0–10.5)
nRBC: 0 % (ref 0.0–0.2)

## 2020-12-23 LAB — BASIC METABOLIC PANEL
Anion gap: 10 (ref 5–15)
BUN: 22 mg/dL (ref 8–23)
CO2: 30 mmol/L (ref 22–32)
Calcium: 8.1 mg/dL — ABNORMAL LOW (ref 8.9–10.3)
Chloride: 102 mmol/L (ref 98–111)
Creatinine, Ser: 1.15 mg/dL — ABNORMAL HIGH (ref 0.44–1.00)
GFR, Estimated: 51 mL/min — ABNORMAL LOW (ref >60)
Glucose, Bld: 139 mg/dL — ABNORMAL HIGH (ref 70–99)
Potassium: 3.1 mmol/L — ABNORMAL LOW (ref 3.5–5.1)
Sodium: 142 mmol/L (ref 135–145)

## 2020-12-23 LAB — SEDIMENTATION RATE: Sed Rate: 8 mm/hr (ref 0–22)

## 2020-12-23 LAB — GLUCOSE, CAPILLARY
Glucose-Capillary: 119 mg/dL — ABNORMAL HIGH (ref 70–99)
Glucose-Capillary: 126 mg/dL — ABNORMAL HIGH (ref 70–99)
Glucose-Capillary: 143 mg/dL — ABNORMAL HIGH (ref 70–99)
Glucose-Capillary: 145 mg/dL — ABNORMAL HIGH (ref 70–99)
Glucose-Capillary: 187 mg/dL — ABNORMAL HIGH (ref 70–99)
Glucose-Capillary: 190 mg/dL — ABNORMAL HIGH (ref 70–99)

## 2020-12-23 LAB — MAGNESIUM: Magnesium: 1.9 mg/dL (ref 1.7–2.4)

## 2020-12-23 MED ORDER — HALOPERIDOL LACTATE 5 MG/ML IJ SOLN
5.0000 mg | INTRAMUSCULAR | Status: DC | PRN
Start: 2020-12-23 — End: 2020-12-30
  Administered 2020-12-23 – 2020-12-28 (×13): 5 mg via INTRAVENOUS
  Filled 2020-12-23 (×14): qty 1

## 2020-12-23 MED ORDER — GUAIFENESIN-DM 100-10 MG/5ML PO SYRP
10.0000 mL | ORAL_SOLUTION | Freq: Three times a day (TID) | ORAL | Status: DC
  Administered 2020-12-25 – 2020-12-30 (×14): 10 mL via ORAL
  Filled 2020-12-23 (×14): qty 10

## 2020-12-23 MED ORDER — POTASSIUM CHLORIDE 20 MEQ PO PACK
40.0000 meq | PACK | Freq: Once | ORAL | Status: AC
  Administered 2020-12-23: 40 meq
  Filled 2020-12-23: qty 2

## 2020-12-23 MED ORDER — LORAZEPAM 2 MG/ML IJ SOLN
1.0000 mg | Freq: Once | INTRAMUSCULAR | Status: AC
  Administered 2020-12-23: 1 mg via INTRAVENOUS
  Filled 2020-12-23: qty 1

## 2020-12-23 MED ORDER — CEFAZOLIN SODIUM-DEXTROSE 1-4 GM/50ML-% IV SOLN
1.0000 g | Freq: Three times a day (TID) | INTRAVENOUS | Status: DC
  Administered 2020-12-23 – 2020-12-28 (×15): 1 g via INTRAVENOUS
  Filled 2020-12-23 (×21): qty 50

## 2020-12-23 MED ORDER — LORAZEPAM 2 MG/ML IJ SOLN
1.0000 mg | INTRAMUSCULAR | Status: DC | PRN
  Administered 2020-12-23 – 2020-12-24 (×2): 1 mg via INTRAVENOUS
  Filled 2020-12-23 (×2): qty 1

## 2020-12-23 MED ORDER — LEVALBUTEROL HCL 0.63 MG/3ML IN NEBU
0.6300 mg | INHALATION_SOLUTION | Freq: Three times a day (TID) | RESPIRATORY_TRACT | Status: DC
  Administered 2020-12-23 – 2020-12-25 (×5): 0.63 mg via RESPIRATORY_TRACT
  Filled 2020-12-23 (×5): qty 3

## 2020-12-23 MED ORDER — IPRATROPIUM-ALBUTEROL 0.5-2.5 (3) MG/3ML IN SOLN: 3.0000 mL | Freq: Four times a day (QID) | RESPIRATORY_TRACT | Status: DC

## 2020-12-23 MED ORDER — FENTANYL CITRATE (PF) 100 MCG/2ML IJ SOLN
25.0000 ug | Freq: Once | INTRAMUSCULAR | Status: AC
  Administered 2020-12-23: 25 ug via INTRAVENOUS
  Filled 2020-12-23: qty 2

## 2020-12-23 MED ORDER — ORAL CARE MOUTH RINSE
15.0000 mL | Freq: Two times a day (BID) | OROMUCOSAL | Status: DC
  Administered 2020-12-23 – 2020-12-30 (×13): 15 mL via OROMUCOSAL

## 2020-12-23 MED ORDER — IPRATROPIUM BROMIDE 0.02 % IN SOLN
0.5000 mg | Freq: Three times a day (TID) | RESPIRATORY_TRACT | Status: DC
  Administered 2020-12-23 – 2020-12-25 (×5): 0.5 mg via RESPIRATORY_TRACT
  Filled 2020-12-23 (×5): qty 2.5

## 2020-12-23 NOTE — Progress Notes (Signed)
Pharmacy Antibiotic Note  [name] is a 70 y.o. female admitted on 12/21/2020 with UTI.  Pharmacy has been consulted for cefazolin dosing.  Plan: Cefazolin 1000 mg IV every 8 hours. Monitor labs, c/s, and patient improvement.  Height: 5\' 6"  (167.6 cm) Weight: 105 kg (231 lb 7.7 oz) IBW/kg (Calculated) : 59.3  Temp (24hrs), Avg:99.7 F (37.6 C), Min:98.42 F (36.9 C), Max:100.76 F (38.2 C)  Recent Labs  Lab 12/21/20 0953 12/22/20 0652 12/23/20 0340  WBC 5.7 7.6 10.4  CREATININE 0.98 1.24* 1.15*    Estimated Creatinine Clearance: 55 mL/min (A) (by C-G formula based on SCr of 1.15 mg/dL (H)).    Allergies  Allergen Reactions   Citalopram Hydrobromide Other (See Comments)    Dyskinesia Other reaction(s): Other (See Comments) Dyskinesia   Codeine Nausea And Vomiting and Other (See Comments)    HALLUCINATIONS Other reaction(s): Unknown Other reaction(s): Other (See Comments) HALLUCINATIONS    Hydromorphone Hcl Other (See Comments)    Made her pass out Other reaction(s): Other (See Comments) Made her pass out   Metoclopramide Other (See Comments)    DYSKINESIA Other reaction(s): Other (See Comments) DYSKINESIA   Hyoscyamine     MADE DIARRHEA WORSE    Antimicrobials this admission: Cefazolin 6/22 >>     Microbiology results: 6/20 UCx: > 100k e coli    Thank you for allowing pharmacy to be a part of this patient's care.  7/20 12/23/2020 1:07 PM

## 2020-12-23 NOTE — Progress Notes (Signed)
SLP Cancellation Note  Patient Details Name: [name] MRN: [id number] DOB: 12/03/1950   Cancelled treatment:       Reason Eval/Treat Not Completed: Medical issues which prohibited therapy;Patient not medically ready. Pt is not appropriate for PO trials at this time, ST will continue efforts, thank you.  Jovante Hammitt H. Romie Levee, CCC-SLP Speech Language Pathologist    Georgetta Haber 12/23/2020, 5:03 PM

## 2020-12-23 NOTE — Procedures (Signed)
Extubation Procedure Note  Patient Details:   Name: [name] DOB: [birth year] MRN: [id number]   Airway Documentation:  Airway 7.5 mm (Active)  Secured at (cm) 24 cm 12/23/20 1215  Measured From Lips 12/23/20 1215  Secured Location Center 12/23/20 1215  Secured By [employer] 12/23/20 1215  Tube Holder Repositioned Yes 12/23/20 1215  Prone position No 12/23/20 1215  Head position Left 12/23/20 1215  Cuff Pressure (cm H2O) Green OR 18-26 [facility] 12/23/20 1215  Site Condition Dry 12/23/20 1215   Vent end date: (not recorded) Vent end time: (not recorded)   Evaluation  O2 sats: stable throughout Complications: No apparent complications Patient did not tolerate procedure well just due to being so agitated. But O2 is 96% on 4lpm [place]. Patient is not able to speak due to being sedated until extubation. Patient still very agitated even through sedation.   Lucia Gaskins 12/23/2020, 1:15 PM

## 2020-12-23 NOTE — Progress Notes (Signed)
Pt placed on 9lpm salter HFNC spo2 96% rr 14 hr 78

## 2020-12-23 NOTE — Progress Notes (Signed)
Fentanyl D/C'd on patient, 100 mL wasted in stericycle and in Pyxis with C. Andi Devon, Charity fundraiser.

## 2020-12-23 NOTE — Progress Notes (Signed)
Patient excessively moving in bed to the point she self-prones. Patient unable to follow verbal commands and almost self-extubated twice this shift. Attending paged for wrist restraints. Restraints applied, see documentation.

## 2020-12-23 NOTE — Progress Notes (Addendum)
NAME:  [name], MRN:  [id number], DOB:  [birth year], LOS: 2 ADMISSION DATE:  12/21/2020, CONSULTATION DATE:  6/20 REFERRING MD:  Manuella Ghazi, [facility], CHIEF COMPLAINT:  sob   History of Present Illness:  27  yowf quit smoking 1984 (s airflow obst a recently as 01/2016 pft) with medical history significant for obesity, type 2 diabetes, chronic atrial fibrillation on Eliquis, dementia, diastolic CHF with chronic hypoxemia on 2 L nasal cannula, hypothyroidism, seizure disorder, CKD stage III, CAD with prior NSTEMI, dyslipidemia, and GERD who presented to the ED with worsening shortness of breath over the past couple days.  Daughter at bedside states that she has had approximately 20 pound weight gain noted as well over the same time frame.  Her symptoms were worsening and EMS was called and upon arrival she was noted to have pulse oximetry of 55%.  She was noted to be taking her medications normally according to the daughter and was wearing her home oxygen.  She denied any chest pain, cough, fevers, or chills.  Some mild lower extremity edema has been noted as well.  No other sick contacts and she has had her COVID vaccines and booster shots.  She appears to have been admitted just last month with acute on chronic diastolic CHF.  Her discharge weight at that time was 214 pounds.  She is currently at 227 pounds.   ED Course: Vital signs with some mild tachycardia noted with atrial fibrillation on EKG and ABG with PCO2 68.2 and PO2 of 71.5.  Troponin of 113.  BNP 453.  Chest x-ray with some cardiomegaly and pulmonary vascular congestion noted.  COVID testing pending.  She has been started on some Solu-Medrol as well as Lasix twice daily.  She is currently on BiPAP 40% FiO2.   Baseline per daughter = mostly housbound on 2lpm 24/7 with baseline dementia but AMS worse than usual x several weeks assoc with wt gain, no cough or fever or cp / most the swelling is in her abd not legs    Pertinent  Medical History      Significant Hospital Events: Including procedures, antibiotic start and stop dates in addition to other pertinent events   MRSA  PCR 6/20 neg Resp viral panel PCR  6/20 neg Urine culture  6/20  Ecoli > 100k ET 6/20  > 6/222  PIC  RUE 6/21 >>> ESR 6/22 >>> Ancef 6/22 >>>   Scheduled Meds:  amiodarone  200 mg Per Tube q morning   atorvastatin  80 mg Per Tube Daily   chlorhexidine gluconate (MEDLINE KIT)  15 mL Mouth Rinse BID   Chlorhexidine Gluconate Cloth  6 each Topical Daily   cholecalciferol  2,000 Units Per Tube Daily   docusate  100 mg Per Tube BID   donepezil  5 mg Per Tube Daily   DULoxetine  30 mg Oral BID   enoxaparin (LOVENOX) injection  100 mg Subcutaneous Q12H   insulin aspart  0-15 Units Subcutaneous Q4H   levothyroxine  175 mcg Per Tube Daily   mouth rinse  15 mL Mouth Rinse 10 times per day   multivitamin  15 mL Per Tube Daily   pantoprazole (PROTONIX) IV  40 mg Intravenous Q12H   polyethylene glycol  17 g Per Tube Daily   potassium chloride  20 mEq Per Tube Daily   sodium chloride flush  10-40 mL Intracatheter Q12H   sodium chloride flush  3 mL Intravenous Q12H   Continuous Infusions:  sodium  chloride Stopped (12/22/20 1010)   dexmedetomidine (PRECEDEX) IV infusion 1.2 mcg/kg/hr (12/23/20 1245)   fentaNYL infusion INTRAVENOUS 300 mcg/hr (12/23/20 1141)   levETIRAcetam Stopped (12/23/20 1018)   norepinephrine (LEVOPHED) Adult infusion Stopped (12/23/20 0737)   PRN Meds:.sodium chloride, acetaminophen **OR** acetaminophen, albuterol, haloperidol lactate, ipratropium-albuterol, loperamide, meclizine, ondansetron **OR** ondansetron (ZOFRAN) IV, sodium chloride flush, sodium chloride flush    Interim History / Subjective:  Extremely unhappy with ET, trying to pull it despite heavy sedation/ fm at bedside   Objective   Blood pressure 131/65, pulse 82, temperature (!) 100.76 F (38.2 C), resp. rate 20, height '5\' 6"'  (1.676 m), weight 105 kg, SpO2 96 %. CVP:   [11 mmHg-14 mmHg] 12 mmHg  Vent Mode: PRVC FiO2 (%):  [40 %] 40 % Set Rate:  [18 bmp] 18 bmp Vt Set:  [470 mL] 470 mL PEEP:  [5 cmH20] 5 cmH20 Pressure Support:  [14 cmH20] 14 cmH20 Plateau Pressure:  [16 cmH20-24 cmH20] 16 cmH20   Intake/Output Summary (Last 24 hours) at 12/23/2020 1259 Last data filed at 12/23/2020 1141 Gross per 24 hour  Intake 2332.5 ml  Output 1860 ml  Net 472.5 ml   Filed Weights   12/21/20 0934 12/21/20 2206 12/23/20 0516  Weight: 103 kg 103.5 kg 105 kg    Examination:  Tmax 100.6 General appearance:    elderly wf fighting vent and pulling at ET   No jvd Oropharynx clear,  mucosa nl Neck supple Lungs with a few scattered exp > insp rhonchi bilaterally RRR no s3 or or sign murmur Abd obese with poor excursion  Extr warm with no edema or clubbing noted Neuro  spont moves all 4, not consistently f/c / no apparent motor deficits       I personally reviewed images and agree with radiology impression as follows:  CXR:   portable 6/21 PICC line in satisfactory position.  Remainder of the exam is stable.     Assessment & Plan:  Acute on chronic hypercarbic and hypoxemic resp failure with biventricular chf but only mild pulmonary edema on cxr suggesting restrictive changes  (documented by pfts 12/05/17) or chf causing decompensation (she has massive CM, kyphosis, and obesity contributing to restriction and none of these components are reversible in the foreseeable future) >>>  very limited options here given pt is not a good candidate at all for NIV and would likely need to be sedated to tolerate any kind of mask >>> palliative care consult for EOL discussion  > full code for now   AMS c/w TME likely related to acute on chronic hypercabia/ C02 narcosis  >>> continue precedex/ haldol post extubation   Underlying dementia very challenging in terms of addressing her long term outlook and for hypercabia as I strongly doubt she would be compliant with  NIV  Relative hypovolemia presently making her bp borderline in setting of cor pulmonale (WHO III) with Left heart failure (WHO II) PHJ  - cvp around 12 p fluid challenge, wean off levophed as tol. No further fluid boluses  - check esr for ? Amiodarone tox  CAF with rapid ventricular response  -  should improve once extubated, add BB or cardizem if bp allows - recheck echo 6/22 >>>   URI/ doubt sepsis >  rx Ancef per flowsheet above   Labs   CBC: Recent Labs  Lab 12/21/20 0953 12/22/20 0652 12/23/20 0340  WBC 5.7 7.6 10.4  NEUTROABS 3.6  --   --   HGB 9.5*  8.8* 9.4*  HCT 32.4* 28.7* 30.3*  MCV 98.8 92.6 92.9  PLT 221 182 080    Basic Metabolic Panel: Recent Labs  Lab 12/21/20 0953 12/22/20 0652 12/23/20 0340  NA 140 142 142  K 3.9 3.9 3.1*  CL 96* 100 102  CO2 36* 31 30  GLUCOSE 88 185* 139*  BUN '15 22 22  ' CREATININE 0.98 1.24* 1.15*  CALCIUM 8.4* 8.4* 8.1*  MG  --  2.1 1.9   GFR: Estimated Creatinine Clearance: 55 mL/min (A) (by C-G formula based on SCr of 1.15 mg/dL (H)). Recent Labs  Lab 12/21/20 0953 12/22/20 0652 12/23/20 0340  WBC 5.7 7.6 10.4    Liver Function Tests: Recent Labs  Lab 12/21/20 0953  AST 17  ALT 13  ALKPHOS 56  BILITOT 0.4  PROT 6.2*  ALBUMIN 3.2*   No results for input(s): LIPASE, AMYLASE in the last 168 hours. No results for input(s): AMMONIA in the last 168 hours.  ABG    Component Value Date/Time   PHART 7.386 12/21/2020 1539   PCO2ART 55.0 (H) 12/21/2020 1539   PO2ART 235 (H) 12/21/2020 1539   HCO3 30.5 (H) 12/21/2020 1539   TCO2 27 09/06/2020 1238   ACIDBASEDEF 1.0 01/26/2015 1132   O2SAT 99.4 12/21/2020 1539     Coagulation Profile: No results for input(s): INR, PROTIME in the last 168 hours.  Cardiac Enzymes: No results for input(s): CKTOTAL, CKMB, CKMBINDEX, TROPONINI in the last 168 hours.  HbA1C: Hgb A1c MFr Bld  Date/Time Value Ref Range Status  11/22/2020 04:03 AM 7.6 (H) 4.8 - 5.6 % Final     Comment:    (NOTE) Pre diabetes:          5.7%-6.4%  Diabetes:              >6.4%  Glycemic control for   <7.0% adults with diabetes   08/24/2020 10:02 AM 7.9 (H) 4.8 - 5.6 % Final    Comment:    (NOTE) Pre diabetes:          5.7%-6.4%  Diabetes:              >6.4%  Glycemic control for   <7.0% adults with diabetes     CBG: Recent Labs  Lab 12/22/20 1922 12/22/20 2358 12/23/20 0513 12/23/20 0704 12/23/20 1126  GLUCAP 146* 145* 143* 126* 119*       The patient is critically ill with multiple organ systems failure and requires high complexity decision making for assessment and support, frequent evaluation and titration of therapies, application of advanced monitoring technologies and extensive interpretation of multiple databases. Critical Care Time devoted to patient care services described in this note is 40 minutes.    Christinia Gully, MD Pulmonary and [place] [phone]   After 7:00 pm call Elink  [phone]

## 2020-12-23 NOTE — Progress Notes (Signed)
PROGRESS NOTE    [name]  HTX:[id number] DOB: 01/23/1951 DOA: 12/21/2020 PCP: Lemmie Evens, MD   Subjective: The patient was seen and examined this morning, continues to be intubated, sedated. Still moving in bed, Overnight was found hypotensive, pressors were reinitiated, electrolytes were repleted    Brief Narrative:   [name] is a 70 y.o. female with medical history significant for obesity, type 2 diabetes, chronic atrial fibrillation on Eliquis, dementia, diastolic CHF with chronic hypoxemia on 2 L nasal cannula, hypothyroidism, seizure disorder, CKD stage III, CAD with prior NSTEMI, dyslipidemia, and GERD who presented to the ED with worsening shortness of breath over the past couple days.  She was admitted with acute on chronic hypoxemic and hypercarbic respiratory failure in the setting of CHF exacerbation.  BiPAP was initially attempted, but she could not tolerate this and therefore was intubated.  PCCM now following for ventilator management.  She is having hypotension requiring pressor use.  Assessment & Plan:   Active Problems:   Acute respiratory failure with hypoxia and hypercapnia (HCC)   Acute hypoxemic respiratory failure (HCC)   Acute on chronic hypoxemic and hypercarbic respiratory failure -Remains on the vent-stable -Continues to remain intubated, PCCM has suggested palliative consultation -Has had prior intubations and tracheostomy -Noted to have pulmonary arterial hypertension on prior CT 11/2020 -COVID testing negative; vaccinated and boosted -IV Lasix twice daily currently held due to hypotension -Monitor in ICU   Acute on chronic diastolic CHF exacerbation -Recent 2D echocardiogram 5/22 with LVEF 55-60% and indeterminate diastolic parameters -Holding IV Lasix at this time due to hypotension -Still hypotensive-on pressors -Receiving IV fluid boluses on account of this -Strict I's and O's -Monitor daily weights -Repeat a.m.  labs  Hypotension likely related to sedation -Continue gentle IV fluid hydration -Overnight initiated on Levophed -Started on norepinephrine and PICC line placed 6/21   Elevated troponin -Likely secondary to above -Continue to trend and monitor   Chronic atrial fibrillation- -Tachycardic, initiated on IV amiodarone -Continue amiodarone and full dose Lovenox -Monitor on telemetry   Type 2 diabetes -Recent hemoglobin A1c 7.6% -Currently n.p.o. -Tube feeds, checking CBG every 4 hours with sliding scale coverage   AKI on CKD stage IIIa -Continue to monitor -Fluid bolus given -We will start on maintenance IV fluid   CAD with prior NSTEMI/dyslipidemia -Continue statin -   Anemia of chronic disease -Baseline hemoglobin near 10, transfuse for hemoglobin less than 7 -Current hemoglobin 8.8, >> 9.4, stable  OSA/OHS -Patient intolerant to CPAP   Hypothyroidism -Recent TSH 0.526 -Continue Synthroid per tube   History of seizures -Continue Keppra, now IV   History of dementia -Continue Aricept   Stage I sacral ulcer -Present on admission -Wound care   Obesity -BMI 36.64 -Lifestyle changes outpatient   DVT prophylaxis: Full dose Lovenox Code Status: Full -discussed with daughter patient remains full code Family Communication: Daughter, [name] at bedside 6/22 Disposition Plan:  Status is: Inpatient  Remains inpatient appropriate because:Hemodynamically unstable, IV treatments appropriate due to intensity of illness or inability to take PO, and Inpatient level of care appropriate due to severity of illness  Dispo: The patient is from: Home              Anticipated d/c is to: SNF              Patient currently is not medically stable to d/c.   Difficult to place patient No  Skin Assessment:  I have examined the patient's skin and  I agree with the wound assessment as performed by the wound care RN as outlined below:  Pressure Injury 11/21/20 Sacrum Medial Stage  1 -  Intact skin with non-blanchable redness of a localized area usually over a bony prominence. Stage 1 pressure Ulcer non blanchable (Active)  11/21/20 2259  Location: Sacrum  Location Orientation: Medial  Staging: Stage 1 -  Intact skin with non-blanchable redness of a localized area usually over a bony prominence.  Wound Description (Comments): Stage 1 pressure Ulcer non blanchable  Present on Admission: Yes    Consultants:  PCCM Palliative  Procedures:  Intubation 6/20 PICC line 6/21 See below  Antimicrobials:  Anti-infectives (From admission, onward)    None        Objective: Vitals:   12/23/20 1130 12/23/20 1145 12/23/20 1200 12/23/20 1215  BP: 125/79 (!) 116/95 119/67 (!) 133/102  Pulse: 81 72 79 (!) 139  Resp: 18 (!) 22 (!) 22 19  Temp: (!) 100.58 F (38.1 C) (!) 100.58 F (38.1 C) (!) 100.58 F (38.1 C) (!) 100.76 F (38.2 C)  TempSrc:      SpO2: 98% 93% 93% 100%  Weight:      Height:        Intake/Output Summary (Last 24 hours) at 12/23/2020 1226 Last data filed at 12/23/2020 1141 Gross per 24 hour  Intake 2332.5 ml  Output 1860 ml  Net 472.5 ml   Filed Weights   12/21/20 0934 12/21/20 2206 12/23/20 0516  Weight: 103 kg 103.5 kg 105 kg      Physical Exam:   General:  Sedated - intubated  HEENT:  Normocephalic, PERRL, otherwise with in Normal limits   Neuro:  Limited exam patient is sedated  Lungs:   Intubated on the vent, good air entry bilaterally, minimally diminished at lower lobes  Cardio:    S1/S2, tachycardic  Abdomen:   Soft, non-tender, bowel sounds active all four quadrants,  no guarding or peritoneal signs.  Muscular skeletal:  Limited exam patient is sedated, positive pulses bilaterally negative for any edema  Skin:  Dry, warm to touch, negative for any Rashes,  Wounds: Please see nursing documentation  Pressure Injury 11/21/20 Sacrum Medial Stage 1 -  Intact skin with non-blanchable redness of a localized area usually over a  bony prominence. Stage 1 pressure Ulcer non blanchable (Active)  11/21/20 2259  Location: Sacrum  Location Orientation: Medial  Staging: Stage 1 -  Intact skin with non-blanchable redness of a localized area usually over a bony prominence.  Wound Description (Comments): Stage 1 pressure Ulcer non blanchable  Present on Admission: Yes          Data Reviewed: I have personally reviewed following labs and imaging studies  CBC: Recent Labs  Lab 12/21/20 0953 12/22/20 0652 12/23/20 0340  WBC 5.7 7.6 10.4  NEUTROABS 3.6  --   --   HGB 9.5* 8.8* 9.4*  HCT 32.4* 28.7* 30.3*  MCV 98.8 92.6 92.9  PLT 221 182 782   Basic Metabolic Panel: Recent Labs  Lab 12/21/20 0953 12/22/20 0652 12/23/20 0340  NA 140 142 142  K 3.9 3.9 3.1*  CL 96* 100 102  CO2 36* 31 30  GLUCOSE 88 185* 139*  BUN _0 CREATININE 0.98 1.24* 1.15*  CALCIUM 8.4* 8.4* 8.1*  MG  --  2.1 1.9   GFR: Estimated Creatinine Clearance: 55 mL/min (A) (by C-G formula based on SCr of 1.15 mg/dL (H)). Liver Function Tests:  Recent Labs  Lab 12/22/20 1922 12/22/20 2358 12/23/20 0513 12/23/20 0704 12/23/20 1126  GLUCAP 146* 145* 143* 126* 119*   Lipid Profile: Recent Labs    12/22/20 0652  TRIG 29   Thyroid Function Tests: No results for input(s): TSH, T4TOTAL, FREET4, T3FREE, THYROIDAB in the last 72 hours. Anemia Panel: No results for input(s): VITAMINB12, FOLATE, FERRITIN, TIBC, IRON, RETICCTPCT in the last 72 hours. Sepsis Labs: No results for input(s): PROCALCITON, LATICACIDVEN in the last 168 hours.  Recent Results (from the past 240 hour(s))  MRSA Next Gen by PCR, Nasal     Status: None   Collection Time: 12/21/20  9:20 AM   Specimen: Nasal Mucosa; Nasal Swab  Result Value Ref Range Status   MRSA by PCR Next Gen NOT DETECTED NOT DETECTED Final    Comment: (NOTE) The GeneXpert MRSA Assay (FDA approved for NASAL specimens only), is one component of a comprehensive MRSA colonization  surveillance program. It is not intended to diagnose MRSA infection nor to guide or monitor treatment for MRSA infections. Test performance is not FDA approved in patients less than 58 years old. Performed at [facility], [address] [place], [place] [address]   Resp Panel by RT-PCR (Flu A&B, Covid) Nasopharyngeal Swab     Status: None   Collection Time: 12/21/20 11:45 AM   Specimen: Nasopharyngeal Swab; Nasopharyngeal(NP) swabs in vial transport medium  Result Value Ref Range Status   SARS Coronavirus 2 by RT PCR NEGATIVE NEGATIVE Final    Comment: (NOTE) SARS-CoV-2 target nucleic acids are NOT DETECTED.  The SARS-CoV-2 RNA is generally detectable in upper respiratory specimens during the acute phase of infection. The lowest concentration of SARS-CoV-2 viral copies this assay can detect is 138 copies/mL. A negative result does not preclude SARS-Cov-2 infection and should not be used as the sole basis for treatment or other patient management decisions. A negative result may occur with  improper specimen collection/handling, submission of specimen other than nasopharyngeal swab, presence of viral mutation(s) within the areas targeted by this assay, and inadequate number of viral copies(<138 copies/mL). A negative result must be combined with clinical observations, patient history, and epidemiological information. The expected result is Negative.  Fact Sheet for Patients:  EntrepreneurPulse.com.au  Fact Sheet for Healthcare Providers:  IncredibleEmployment.be  This test is no t yet approved or cleared by the [place] FDA and  has been authorized for detection and/or diagnosis of SARS-CoV-2 by FDA under an Emergency Use Authorization (EUA). This EUA will remain  in effect (meaning this test can be used) for the duration of the COVID-19 declaration under Section 564(b)(1) of the Act, 21 U.S.C.section 360bbb-3(b)(1), unless the  authorization is terminated  or revoked sooner.       Influenza A by PCR NEGATIVE NEGATIVE Final   Influenza B by PCR NEGATIVE NEGATIVE Final    Comment: (NOTE) The Xpert Xpress SARS-CoV-2/FLU/RSV plus assay is intended as an aid in the diagnosis of influenza from Nasopharyngeal swab specimens and should not be used as a sole basis for treatment. Nasal washings and aspirates are unacceptable for Xpert Xpress SARS-CoV-2/FLU/RSV testing.  Fact Sheet for Patients: EntrepreneurPulse.com.au  Fact Sheet for Healthcare Providers: IncredibleEmployment.be  This test is not yet approved or cleared by the [place] FDA and has been authorized for detection and/or diagnosis of SARS-CoV-2 by FDA under an Emergency Use Authorization (EUA). This EUA will remain in effect (meaning this test can be used) for the duration of the COVID-19 declaration under Section 564(b)(1) of  the Act, 21 U.S.C. section 360bbb-3(b)(1), unless the authorization is terminated or revoked.  Performed at [facility], [address] [place], [place] [address]   Culture, Urine     Status: Abnormal (Preliminary result)   Collection Time: 12/21/20  4:43 PM   Specimen: Urine, Random  Result Value Ref Range Status   Specimen Description   Final    URINE, RANDOM Performed at [facility], [address] [place], [place] [address]    Special Requests   Final    NONE Performed at [facility], [address] [place], [place] [address]    Culture >=100,000 COLONIES/mL ESCHERICHIA COLI (A)  Final   Report Status PENDING  Incomplete         Radiology Studies: DG CHEST PORT 1 VIEW  Result Date: 12/22/2020 CLINICAL DATA:  Status post PICC line placement EXAM: PORTABLE CHEST 1 VIEW COMPARISON:  12/21/2020 FINDINGS: Cardiac shadow is enlarged but stable. Aortic calcifications are again seen. Endotracheal tube and gastric catheter are again noted and stable. New right-sided  PICC line is seen with the catheter tip in the distal superior vena cava. Patchy airspace opacities are again identified bilaterally stable from the prior exam. IMPRESSION: PICC line in satisfactory position. Remainder of the exam is stable. Electronically Signed   By: Inez Catalina M.D.   On: 12/22/2020 16:20   Portable Chest x-ray  Result Date: 12/21/2020 CLINICAL DATA:  Intubation.  Short of breath. EXAM: PORTABLE CHEST 1 VIEW COMPARISON:  12/21/2020 at 10 a.m. FINDINGS: Endotracheal tube tip projects 2.4 cm above the carina. Nasal/orogastric tube passes well below the diaphragm, curling within the stomach, tip below the included field of view. Cardiac silhouette is enlarged. There is persistent vascular congestion with bilateral interstitial opacities. No new lung abnormalities. IMPRESSION: 1. Well-positioned endotracheal tube and nasal/orogastric tube. 2. No change in the appearance of the lungs or cardiomegaly. Suspect congestive heart failure. Electronically Signed   By: Lajean Manes M.D.   On: 12/21/2020 16:20   [place] EKG SITE RITE  Result Date: 12/22/2020 If Site Rite image not attached, placement could not be confirmed due to current cardiac rhythm.       Scheduled Meds:  amiodarone  200 mg Per Tube q morning   atorvastatin  80 mg Per Tube Daily   chlorhexidine gluconate (MEDLINE KIT)  15 mL Mouth Rinse BID   Chlorhexidine Gluconate Cloth  6 each Topical Daily   cholecalciferol  2,000 Units Per Tube Daily   docusate  100 mg Per Tube BID   donepezil  5 mg Per Tube Daily   DULoxetine  30 mg Oral BID   enoxaparin (LOVENOX) injection  100 mg Subcutaneous Q12H   feeding supplement  237 mL Oral TID BM   insulin aspart  0-15 Units Subcutaneous Q4H   levothyroxine  175 mcg Per Tube Daily   mouth rinse  15 mL Mouth Rinse 10 times per day   multivitamin  15 mL Per Tube Daily   pantoprazole (PROTONIX) IV  40 mg Intravenous Q12H   polyethylene glycol  17 g Per Tube Daily   potassium  chloride  20 mEq Per Tube Daily   sodium chloride flush  10-40 mL Intracatheter Q12H   sodium chloride flush  3 mL Intravenous Q12H   Continuous Infusions:  sodium chloride Stopped (12/22/20 1010)   dexmedetomidine (PRECEDEX) IV infusion 1.2 mcg/kg/hr (12/23/20 1141)   fentaNYL infusion INTRAVENOUS 300 mcg/hr (12/23/20 1141)   levETIRAcetam Stopped (12/23/20 1018)  norepinephrine (LEVOPHED) Adult infusion Stopped (12/23/20 0737)     LOS: 2 days    Time spent: 35 minutes    Nancy Arvin A Montrelle Eddings, DO [facility]  If 7PM-7AM, please contact night-coverage [email or web address] 12/23/2020, 12:26 PM

## 2020-12-24 ENCOUNTER — Other Ambulatory Visit (HOSPITAL_COMMUNITY): Payer: PPO

## 2020-12-24 DIAGNOSIS — R7989 Other specified abnormal findings of blood chemistry: Secondary | ICD-10-CM

## 2020-12-24 DIAGNOSIS — I4891 Unspecified atrial fibrillation: Secondary | ICD-10-CM

## 2020-12-24 DIAGNOSIS — R778 Other specified abnormalities of plasma proteins: Secondary | ICD-10-CM

## 2020-12-24 DIAGNOSIS — I272 Pulmonary hypertension, unspecified: Secondary | ICD-10-CM

## 2020-12-24 LAB — GLUCOSE, CAPILLARY
Glucose-Capillary: 102 mg/dL — ABNORMAL HIGH (ref 70–99)
Glucose-Capillary: 112 mg/dL — ABNORMAL HIGH (ref 70–99)
Glucose-Capillary: 133 mg/dL — ABNORMAL HIGH (ref 70–99)
Glucose-Capillary: 152 mg/dL — ABNORMAL HIGH (ref 70–99)
Glucose-Capillary: 161 mg/dL — ABNORMAL HIGH (ref 70–99)
Glucose-Capillary: 89 mg/dL (ref 70–99)

## 2020-12-24 LAB — CBC
HCT: 27.9 % — ABNORMAL LOW (ref 36.0–46.0)
Hemoglobin: 8.2 g/dL — ABNORMAL LOW (ref 12.0–15.0)
MCH: 28.3 pg (ref 26.0–34.0)
MCHC: 29.4 g/dL — ABNORMAL LOW (ref 30.0–36.0)
MCV: 96.2 fL (ref 80.0–100.0)
Platelets: 141 10*3/uL — ABNORMAL LOW (ref 150–400)
RBC: 2.9 MIL/uL — ABNORMAL LOW (ref 3.87–5.11)
RDW: 16.5 % — ABNORMAL HIGH (ref 11.5–15.5)
WBC: 6.4 10*3/uL (ref 4.0–10.5)
nRBC: 0 % (ref 0.0–0.2)

## 2020-12-24 LAB — URINE CULTURE: Culture: >=100000 — AB

## 2020-12-24 MED ORDER — HALOPERIDOL LACTATE 5 MG/ML IJ SOLN: 2.0000 mg | Freq: Once | INTRAMUSCULAR | Status: DC

## 2020-12-24 MED ORDER — FUROSEMIDE 10 MG/ML IJ SOLN
20.0000 mg | Freq: Once | INTRAMUSCULAR | Status: AC
  Administered 2020-12-24: 20 mg via INTRAVENOUS
  Filled 2020-12-24: qty 2

## 2020-12-24 MED ORDER — LACTATED RINGERS IV SOLN: INTRAVENOUS | Status: DC

## 2020-12-24 MED ORDER — LORAZEPAM 2 MG/ML IJ SOLN
1.0000 mg | Freq: Once | INTRAMUSCULAR | Status: AC
  Administered 2020-12-24: 1 mg via INTRAVENOUS
  Filled 2020-12-24: qty 1

## 2020-12-24 NOTE — Progress Notes (Signed)
NAME:  [name], MRN:  [id number], DOB:  08-21-1950, LOS: 3 ADMISSION DATE:  12/21/2020, CONSULTATION DATE:  6/20 REFERRING MD:  Manuella Ghazi, [facility], CHIEF COMPLAINT:  sob   History of Present Illness:  62  yowf quit smoking 1984 (s airflow obst a recently as 01/2016 pft) with medical history significant for obesity, type 2 diabetes, chronic atrial fibrillation on Eliquis, dementia, diastolic CHF with chronic hypoxemia on 2 L nasal cannula, hypothyroidism, seizure disorder, CKD stage III, CAD with prior NSTEMI, dyslipidemia, and GERD who presented to the ED with worsening shortness of breath over the past couple days.  Daughter at bedside states that she has had approximately 20 pound weight gain noted as well over the same time frame.  Her symptoms were worsening and EMS was called and upon arrival she was noted to have pulse oximetry of 55%.  She was noted to be taking her medications normally according to the daughter and was wearing her home oxygen.  She denied any chest pain, cough, fevers, or chills.  Some mild lower extremity edema has been noted as well.  No other sick contacts and she has had her COVID vaccines and booster shots.  She appears to have been admitted just last month with acute on chronic diastolic CHF.  Her discharge weight at that time was 214 pounds.  She is currently at 227 pounds.   ED Course: Vital signs with some mild tachycardia noted with atrial fibrillation on EKG and ABG with PCO2 68.2 and PO2 of 71.5.  Troponin of 113.  BNP 453.  Chest x-ray with some cardiomegaly and pulmonary vascular congestion noted.  COVID testing pending.  She has been started on some Solu-Medrol as well as Lasix twice daily.  She is currently on BiPAP 40% FiO2.   Baseline per daughter = mostly housbound on 2lpm 24/7 with baseline dementia but AMS worse than usual x several weeks assoc with wt gain, no cough or fever or cp / most the swelling is in her abd not legs    Pertinent  Medical History      Significant Hospital Events: Including procedures, antibiotic start and stop dates in addition to other pertinent events   MRSA  PCR 6/20 neg Resp viral panel PCR  6/20 neg Urine culture  6/20  Ecoli > 100k ET 6/20  > 6/22  PIC  RUE 6/21 >>> ESR 6/22 >>> Ancef 6/22 >>>   Scheduled Meds:  amiodarone  200 mg Per Tube q morning   atorvastatin  80 mg Per Tube Daily   chlorhexidine gluconate (MEDLINE KIT)  15 mL Mouth Rinse BID   Chlorhexidine Gluconate Cloth  6 each Topical Daily   cholecalciferol  2,000 Units Per Tube Daily   docusate  100 mg Per Tube BID   donepezil  5 mg Per Tube Daily   DULoxetine  30 mg Oral BID   enoxaparin (LOVENOX) injection  100 mg Subcutaneous Q12H   guaiFENesin-dextromethorphan  10 mL Oral Q8H   insulin aspart  0-15 Units Subcutaneous Q4H   ipratropium  0.5 mg Nebulization TID   levalbuterol  0.63 mg Nebulization TID   levothyroxine  175 mcg Per Tube Daily   mouth rinse  15 mL Mouth Rinse BID   multivitamin  15 mL Per Tube Daily   pantoprazole (PROTONIX) IV  40 mg Intravenous Q12H   polyethylene glycol  17 g Per Tube Daily   potassium chloride  20 mEq Per Tube Daily   sodium chloride flush  10-40 mL Intracatheter Q12H   sodium chloride flush  3 mL Intravenous Q12H   Continuous Infusions:  sodium chloride 10 mL/hr at 12/24/20 0702    ceFAZolin (ANCEF) IV Stopped (12/24/20 0615)   dexmedetomidine (PRECEDEX) IV infusion 1.2 mcg/kg/hr (12/24/20 5027)   levETIRAcetam Stopped (12/23/20 2316)   norepinephrine (LEVOPHED) Adult infusion 3 mcg/min (12/24/20 0702)   PRN Meds:.sodium chloride, acetaminophen **OR** acetaminophen, albuterol, haloperidol lactate, loperamide, LORazepam, meclizine, ondansetron **OR** ondansetron (ZOFRAN) IV, sodium chloride flush, sodium chloride flush    Interim History / Subjective:   Off pressors,still on predex at 1.2 for agitation   Objective   Blood pressure (!) 125/91, pulse 61, temperature (!) 97.16 F (36.2 C),  resp. rate 18, height '5\' 6"'  (1.676 m), weight 105 kg, SpO2 98 %. CVP:  [12 mmHg-15 mmHg] 15 mmHg  Vent Mode: PRVC FiO2 (%):  [40 %-100 %] 100 % Set Rate:  [18 bmp] 18 bmp Vt Set:  [470 mL] 470 mL PEEP:  [5 cmH20] 5 cmH20 Plateau Pressure:  [16 cmH20-22 cmH20] 16 cmH20   Intake/Output Summary (Last 24 hours) at 12/24/2020 7412 Last data filed at 12/24/2020 8786 Gross per 24 hour  Intake 1563.16 ml  Output 225 ml  Net 1338.16 ml   Filed Weights   12/21/20 0934 12/21/20 2206 12/23/20 0516  Weight: 103 kg 103.5 kg 105 kg    Examination:  Tmax  100.6 General appearance:    Elderly wf > stated age/ fetal position on L / reaching for lines/fm members, doesn't look comfortable at all   At Rest 02 sats  100% on 6lpm  No jvd Oropharynx clear,  mucosa very dry despite noted increase in cvp/ pos I/0s Neck supple Lungs with a few scattered exp > insp rhonchi bilaterally RRR no s3 or or sign murmur Abd obese with poor excursion  Extr warm with no edema or clubbing noted Neuro  Sensorium not conversant but randomly moving all 4 ext s  apparent motor deficits      Assessment & Plan:  Acute on chronic hypercarbic and hypoxemic resp failure with biventricular chf but only mild pulmonary edema on cxr suggesting restrictive changes  (documented by pfts 12/05/17) or chf causing decompensation (she has massive CM, kyphosis, and obesity contributing to restriction and none of these components are reversible in the foreseeable future) >>>  very limited options here given pt is not a good candidate at all for NIV and would likely need to be sedated to tolerate any kind of mask >>>  > full code for now  with reasoning that she survived vent/trach previously   AMS c/w TME likely related to acute on chronic hypercabia/ C02 narcosis  >>> continue precedex/ haldol prn post extubation   Underlying dementia very challenging in terms of addressing her long term outlook and for hypercabia as I strongly doubt  she would be compliant with NIV  Relative hypovolemia presently making her bp borderline in setting of cor pulmonale (WHO III) with Left heart failure (WHO II) PHJ - cvp trending  up > one dose lasix if bp bun creat allow    CAF with rapid ventricular response   - slowed ventricular response off et and on precedex which trying to taper   URI/ doubt sepsis >  continue  Ancef per flowsheet above   Labs   CBC: Recent Labs  Lab 12/21/20 0953 12/22/20 0652 12/23/20 0340  WBC 5.7 7.6 10.4  NEUTROABS 3.6  --   --   HGB  9.5* 8.8* 9.4*  HCT 32.4* 28.7* 30.3*  MCV 98.8 92.6 92.9  PLT 221 182 101    Basic Metabolic Panel: Recent Labs  Lab 12/21/20 0953 12/22/20 0652 12/23/20 0340  NA 140 142 142  K 3.9 3.9 3.1*  CL 96* 100 102  CO2 36* 31 30  GLUCOSE 88 185* 139*  BUN '15 22 22  ' CREATININE 0.98 1.24* 1.15*  CALCIUM 8.4* 8.4* 8.1*  MG  --  2.1 1.9   GFR: Estimated Creatinine Clearance: 55 mL/min (A) (by C-G formula based on SCr of 1.15 mg/dL (H)). Recent Labs  Lab 12/21/20 0953 12/22/20 0652 12/23/20 0340  WBC 5.7 7.6 10.4    Liver Function Tests: Recent Labs  Lab 12/21/20 0953  AST 17  ALT 13  ALKPHOS 56  BILITOT 0.4  PROT 6.2*  ALBUMIN 3.2*   No results for input(s): LIPASE, AMYLASE in the last 168 hours. No results for input(s): AMMONIA in the last 168 hours.  ABG    Component Value Date/Time   PHART 7.386 12/21/2020 1539   PCO2ART 55.0 (H) 12/21/2020 1539   PO2ART 235 (H) 12/21/2020 1539   HCO3 30.5 (H) 12/21/2020 1539   TCO2 27 09/06/2020 1238   ACIDBASEDEF 1.0 01/26/2015 1132   O2SAT 99.4 12/21/2020 1539     Coagulation Profile: No results for input(s): INR, PROTIME in the last 168 hours.  Cardiac Enzymes: No results for input(s): CKTOTAL, CKMB, CKMBINDEX, TROPONINI in the last 168 hours.  HbA1C: Hgb A1c MFr Bld  Date/Time Value Ref Range Status  11/22/2020 04:03 AM 7.6 (H) 4.8 - 5.6 % Final    Comment:    (NOTE) Pre diabetes:           5.7%-6.4%  Diabetes:              >6.4%  Glycemic control for   <7.0% adults with diabetes   08/24/2020 10:02 AM 7.9 (H) 4.8 - 5.6 % Final    Comment:    (NOTE) Pre diabetes:          5.7%-6.4%  Diabetes:              >6.4%  Glycemic control for   <7.0% adults with diabetes     CBG: Recent Labs  Lab 12/23/20 1126 12/23/20 1602 12/23/20 1951 12/24/20 0027 12/24/20 0454  GLUCAP 119* 190* 187* 102* 161*       Christinia Gully, MD [facility] [phone]   After 7:00 pm call Elink  [phone]

## 2020-12-24 NOTE — Progress Notes (Signed)
SLP Cancellation Note  Patient Details Name: [name] MRN: [id number] DOB: [birth year]   Cancelled treatment:       Reason Eval/Treat Not Completed: Medical issues which prohibited therapy;Patient not medically ready;Patient's level of consciousness. Pt did respond verbally to SLP but did not open her eyes and was agitated as SLP attempted to engage Pt. Pt continues to be inappropriate for BSE at this time. SLP will re-attempt tomorrow. Thank you,  Seham Gardenhire H. Romie Levee, CCC-SLP Speech Language Pathologist    Georgetta Haber 12/24/2020, 1:55 PM

## 2020-12-24 NOTE — Progress Notes (Signed)
Palliative: [name] is lying quietly in bed sleeping soundly.  She appears acutely/chronically ill, morbidly obese.  She does not open her eyes or try to communicate with me in any meaningful way.  I do not believe that she can make her basic needs known.  Daughter/HC POA, [name], is lying down at bedside.    We talked about [name]'s acute and chronic health concerns, her frailty.  We talked about her respiratory status in detail, and we also talked about her blood pressure and medications to support it.  I share that cardiology has been consulted and should see [name] sometime today.  [name] shares that she has spoken with bedside nursing staff, and denies questions.  Unsolicited, [name] tells me that if [name]'s breathing worsens, they would "put the tube back in and do a trach".  This is in line with patient and family's previous choices/goals of care.  We talked about disposition.  At this point, if [name] continues to improve and qualifies, [name] would like for her to return home with home health.  She declines any short-term rehab.    Conference with CCM.  Return to speak with daughter, [name] about what care for long-term tracheostomy looks like.  [name] shares that they are experienced, and [name] [facility] if needed.  She tells me, "I can't give up on her".   Conference with attending, CCM, bedside nursing staff related to patient condition, needs, goals of care.  Plan: Continue full scope/full code.  Time for outcomes.  Would decline short-term rehab, home with home health  45 minutes [name], [name] Palliative medicine team Team phone [phone] Greater than 50% of this time was spent counseling and coordinating care related to the above assessment and plan.

## 2020-12-24 NOTE — Consult Note (Addendum)
Cardiology Consultation:   Patient ID: [name] MRN: [id number]; DOB: 05/09/1951  Admit date: 12/21/2020 Date of Consult: 12/24/2020  PCP:  Tara Evens, MD   [place] [facility] Cardiologist:  Tara Dolly, MD        Patient Profile:   [name] is a 70 y.o. female with a past medical history of HFimpEF (EF 40% in 2016, improved to 55-60% by repeat imaging in 01/2015, 60-65% by echo in 11/2017, reported at 30% in 08/2020 and cath showed normal cors, EF 55-60% in 11/2020), pulmonary HTN, chronic hypoxic respiratory failure (on 2L [place] at baseline), paroxysmal atrial fibrillation, HTN, dementia, and OSA (intolerant to CPAP) who is being seen 12/24/2020 for the evaluation of atrial fibrillation with RVR at the request of Dr. Roger [name].  History of Present Illness:   [name] most recently had a telehealth visit in 10/2020 and reported her weight had been stable when checked at home following her recent admission. No changes were made to her medication regimen at that time.   Was admitted to [facility] in 11/2020 for acute hypoxic respiratory failure in the setting of a CHF exacerbation and repeat echo showed her EF had improved to 55-60%. Was discharged on PO Lasix 55m daily and weight was at 214 lbs.   [facility] of worsening dyspnea over the past few days and reported a 20 lb weight gain. Initial ABG showed pCO2 68.2 and pO2 71.5. Initial Hs Troponin 113 with repeat of 105. BNP 453. CXR showed cardiomegaly with mild pulmonary vascular congestion. EKG showed rate-controlled atrial fibrillation, HR 96 with no acute ST changes.   She was initially on BiPAP but would not keep it in place and refused to wear, therefore she required intubation. By review of notes she was very agitated while intubated and required max dose Precedex along with Fentanyl and Ativan. Additional IV Lasix was held due to hypotension and she received  IVF bolus to help with BP and required initiation of Norepinephrine on 6/21. Was extubated yesterday and norepinephrine was stopped yesterday due to improvement in BP but restarted overnight due to hypotension. Cardiology was consulted due to atrial fibrillation with RVR and already being on IV Amiodarone but by review of records, she has only been on PO Amiodarone 2056mdaily. HR was elevated yesterday in the setting of agitation but has been well-controlled in the 70's to 90's overnight and this morning.   In talking with the patient's daughter this morning, she reports she was very agitated yesterday but this has improved with extubation. Says her weight had been fluctuating at home and they had increased Lasix for several days but this did not help with her symptoms. Her daughter feels she did better on Torsemide as compared to Lasix as an outpatient.    Past Medical History:  Diagnosis Date   Abnormal pulmonary function test    Anemia    H/H of 10/30 with a normal MCV in 12/09   Anxiety    Arthritis    Barrett's esophagus    Diagnosed 1995. Last EGD 2016-NO BARRETT'S.    Chest pain    Negative cardiac catheterization in 2002; negative stress nuclear study in 2008   Chronic anticoagulation    Chronic combined systolic and diastolic CHF (congestive heart failure) ([facility]   a. EF predominantly normal during prior echoes but was 40% during 10/2014 echo. b. Most recent 01/2015 EF normal, 55-60%.   Chronic  LBP    Surgical intervention in 1996   Diabetes mellitus, type 2 ([place])    Insulin therapy; exacerbated by prednisone   Dysrhythmia    AFib   Gastroparesis    99% retention 05/2008 on GES   GERD (gastroesophageal reflux disease)    Heart attack ([place]) 08/13/2020   Hiatal hernia    Hyperlipidemia    Hypertension    Hypothyroid    IBS (irritable bowel syndrome)    Obesity    OSA on CPAP    had CPAP and cannot tolerate.   Paroxysmal atrial fibrillation ([place])    Pulmonary hypertension  ([place]) 01/2015   a. Predominantly pulmonary venous hypertension but may be component of PAH.   Seizures ([place])    last seizure was 2 years ago; on keppra for this; unknown etiology   Syncope    a. Admitted 05/2009; magnetic resonance imagin/ MRA - negative; etiology thought to be orthostasis secondary to drugs and dehydration. b. Syncope 02/2015 also felt 2/2 dehydration.    Past Surgical History:  Procedure Laterality Date   BACK SURGERY  1996   BIOPSY N/A 11/08/2013   Procedure: BIOPSY  / Tissue sampling / ulcers present in small intestine;  Surgeon: Tara Binder, MD;  [facility];  Service: Endoscopy;  Laterality: N/A;   CARDIAC CATHETERIZATION  2002   CARDIAC CATHETERIZATION N/A 01/26/2015   Procedure: Right Heart Cath;  Surgeon: Tara Dresser, MD;  Location: [place] [facility] LAB;  Service: Cardiovascular;  Laterality: N/A;   CARDIOVASCULAR STRESS TEST  2008   Stress nuclear study   CARDIOVERSION N/A 03/06/2015   Procedure: CARDIOVERSION;  Surgeon: Tara Dresser, MD;  Location: [place];  Service: Cardiovascular;  Laterality: N/A;   CARPAL TUNNEL RELEASE  1994   COLONOSCOPY  11/2011   Dr. Oneida [name]: Internal hemorrhoids, mild diverticulosis. Random colon biopsies negative.   COLONOSCOPY N/A 11/08/2013   SLF: Normal mucosa in the terminal ileum/The colon IS redundant/  Moderate diverticulosis throughout the entire colon. ileum bx benign. colon bx benign   ESOPHAGOGASTRODUODENOSCOPY  2008   Barrett's without dysplasia. esphagus dilated. antral erosions, h.pylori serologies negative.   ESOPHAGOGASTRODUODENOSCOPY  11/2011   Dr. Oneida [name]: Barrett's esophagus, mild gastritis, diverticulum in the second portion of the duodenum repeat EGD 3 years. Small bowel biopsies negative. Gastric biopsy show reactive gastropathy but no H. pylori. Esophageal biopsies consistent with GERD. Next EGD 11/2014   ESOPHAGOGASTRODUODENOSCOPY N/A 11/21/2014   HUD:JSHF non-erosive gastritis/irregular z-line    GIVENS CAPSULE STUDY  12/07/2011   Proximal small bowel, rare AVM. Distal small bowel, multiple ulcers noted   GIVENS CAPSULE STUDY N/A 09/27/2013   Distal small bowel ulcers extending to TI.   GIVENS CAPSULE STUDY N/A 10/10/2013   Procedure: GIVENS CAPSULE STUDY;  Surgeon: Tara Binder, MD;  [facility];  Service: Endoscopy;  Laterality: N/A;  7:30   IR KYPHO THORACIC WITH BONE BIOPSY  02/09/2018   KNEE ARTHROSCOPY WITH MEDIAL MENISECTOMY Right 06/09/2016   Procedure: KNEE ARTHROSCOPY WITH MEDIAL MENISECTOMY;  Surgeon: Carole Civil, MD;  Location: [facility];  Service: Orthopedics;  Laterality: Right;  medial and lateral menisectomy   LAMINECTOMY  1995   L4-L5   LAPAROSCOPIC CHOLECYSTECTOMY  1990s   LEFT HEART CATHETERIZATION WITH CORONARY ANGIOGRAM  01/10/2014   Procedure: LEFT HEART CATHETERIZATION WITH CORONARY ANGIOGRAM;  Surgeon: Sinclair Grooms, MD;  Location: [facility] CATH LAB;  Service: Cardiovascular;;   RIGHT HEART CATHETERIZATION N/A 01/10/2014  Procedure: RIGHT HEART CATH;  Surgeon: Sinclair Grooms, MD;  Location: [facility] CATH LAB;  Service: Cardiovascular;  Laterality: N/A;   TOTAL ABDOMINAL HYSTERECTOMY  1999   TRACHEOSTOMY TUBE PLACEMENT N/A 09/01/2017   Procedure: TRACHEOSTOMY;  Surgeon: Rozetta Nunnery, MD;  Location: [place];  Service: ENT;  Laterality: N/A;     Home Medications:  Prior to Admission medications   Medication Sig Start Date End Date Taking? Authorizing Provider  acetaminophen (TYLENOL) 500 MG tablet Take 500 mg by mouth as needed for moderate pain or headache.    Yes [provider]  amiodarone (PACERONE) 200 MG tablet TAKE 1 TABLET BY MOUTH EVERY MORNING 03/03/20  Yes Nishita Isaacks, [place] M, PA-C  apixaban (ELIQUIS) 5 MG TABS tablet Take 1 tablet (5 mg total) by mouth 2 (two) times daily. 02/14/20  Yes BranchAlphonse Guild, MD  atorvastatin (LIPITOR) 80 MG tablet Place 1 tablet (80 mg total) into feeding tube daily. 09/22/20  Yes Samella Parr, NP   Cholecalciferol (VITAMIN D3) 2000 units capsule Take 2,000 Units by mouth daily. 10/25/17  Yes [provider]  DULoxetine (CYMBALTA) 30 MG capsule Take 30 mg by mouth 2 (two) times daily. 11/24/17  Yes [provider]  furosemide (LASIX) 20 MG tablet Take 1 tablet (20 mg total) by mouth daily. 09/22/20  Yes Samella Parr, NP  gabapentin (NEURONTIN) 100 MG capsule Take 1 capsule (100 mg total) by mouth 3 (three) times daily. 09/21/20  Yes Samella Parr, NP  insulin glargine (LANTUS) 100 UNIT/ML injection Inject 0.1 mLs (10 Units total) into the skin daily. Patient taking differently: Inject 20 Units into the skin daily. 09/21/20  Yes Samella Parr, NP  Lactase 9000 units CHEW 1 PO Q4H WHEN YOU EAT ICE CREAM OR CHEESE Patient taking differently: Chew 1 tablet by mouth every 4 (four) hours as needed (when eating dairy products). 09/05/19  Yes Fields, Marga Melnick, MD  levETIRAcetam (KEPPRA) 500 MG tablet Take 2 tablets (1,000 mg total) by mouth 2 (two) times daily. Patient taking differently: Take 1,000 mg by mouth daily. 09/21/20  Yes Samella Parr, NP  levothyroxine (SYNTHROID) 175 MCG tablet Take 1 tablet by mouth daily. 05/20/19  Yes [provider]  loperamide (IMODIUM) 2 MG capsule 1 PO Q4H PRN LOOSE STOOLS Patient taking differently: Take 4 mg by mouth every 4 (four) hours as needed. 1 PO Q4H PRN LOOSE STOOLS 09/05/19  Yes Fields, Sandi L, MD  LORazepam (ATIVAN) 0.5 MG tablet Take 0.5 mg by mouth every 8 (eight) hours as needed for anxiety or sedation.   Yes [provider]  meclizine (ANTIVERT) 25 MG tablet Take 1 tablet (25 mg total) by mouth every 8 (eight) hours as needed for dizziness. 10/16/20  Yes BranchAlphonse Guild, MD  Multiple Vitamin (MULTIVITAMIN WITH MINERALS) TABS tablet Take 1 tablet by mouth daily.   Yes [provider]  ondansetron (ZOFRAN) 4 MG tablet TAKE 1 TABLET BY MOUTH FOUR TIMES DAILY AS NEEDED FOR NAUSEA Patient taking  differently: Take 4 mg by mouth 4 (four) times daily as needed for nausea. 01/10/18  Yes Annitta Needs, NP  OXYGEN 2 L daily.    Yes [provider]  pantoprazole (PROTONIX) 40 MG tablet TAKE ONE TABLET BY MOUTH TWICE DAILY BEFORE APPLY MEAL Patient taking differently: Take 40 mg by mouth 2 (two) times daily. 02/07/17  Yes Annitta Needs, NP  potassium chloride (KLOR-CON) 10 MEQ tablet Take 20 mEq  by mouth daily.   Yes [provider]  SM MELATONIN 3 MG TABS tablet Take 3 mg by mouth at bedtime as needed for sleep. 08/22/20  Yes [provider]  feeding supplement (ENSURE ENLIVE / ENSURE PLUS) LIQD Take 237 mLs by mouth 3 (three) times daily between meals. 09/21/20   Samella Parr, NP    Inpatient Medications: Scheduled Meds:  amiodarone  200 mg Per Tube q morning   atorvastatin  80 mg Per Tube Daily   chlorhexidine gluconate (MEDLINE KIT)  15 mL Mouth Rinse BID   Chlorhexidine Gluconate Cloth  6 each Topical Daily   cholecalciferol  2,000 Units Per Tube Daily   docusate  100 mg Per Tube BID   donepezil  5 mg Per Tube Daily   DULoxetine  30 mg Oral BID   enoxaparin (LOVENOX) injection  100 mg Subcutaneous Q12H   guaiFENesin-dextromethorphan  10 mL Oral Q8H   insulin aspart  0-15 Units Subcutaneous Q4H   ipratropium  0.5 mg Nebulization TID   levalbuterol  0.63 mg Nebulization TID   levothyroxine  175 mcg Per Tube Daily   mouth rinse  15 mL Mouth Rinse BID   multivitamin  15 mL Per Tube Daily   pantoprazole (PROTONIX) IV  40 mg Intravenous Q12H   polyethylene glycol  17 g Per Tube Daily   potassium chloride  20 mEq Per Tube Daily   sodium chloride flush  10-40 mL Intracatheter Q12H   sodium chloride flush  3 mL Intravenous Q12H   Continuous Infusions:  sodium chloride 10 mL/hr at 12/24/20 0702    ceFAZolin (ANCEF) IV Stopped (12/24/20 0615)   dexmedetomidine (PRECEDEX) IV infusion 1.2 mcg/kg/hr (12/24/20 0702)   lactated ringers 125 mL/hr at 12/24/20 0856    levETIRAcetam Stopped (12/23/20 2316)   norepinephrine (LEVOPHED) Adult infusion 2 mcg/min (12/24/20 0853)   PRN Meds: sodium chloride, acetaminophen **OR** acetaminophen, albuterol, haloperidol lactate, loperamide, LORazepam, meclizine, ondansetron **OR** ondansetron (ZOFRAN) IV, sodium chloride flush, sodium chloride flush  Allergies:    Allergies  Allergen Reactions   Citalopram Hydrobromide Other (See Comments)    Dyskinesia Other reaction(s): Other (See Comments) Dyskinesia   Codeine Nausea And Vomiting and Other (See Comments)    HALLUCINATIONS Other reaction(s): Unknown Other reaction(s): Other (See Comments) HALLUCINATIONS    Hydromorphone Hcl Other (See Comments)    Made her pass out Other reaction(s): Other (See Comments) Made her pass out   Metoclopramide Other (See Comments)    DYSKINESIA Other reaction(s): Other (See Comments) DYSKINESIA   Hyoscyamine     MADE DIARRHEA WORSE    Social History:   Social History   Socioeconomic History   Marital status: Married    Spouse name: Not on file   Number of children: Not on file   Years of education: Not on file   Highest education level: Not on file  Occupational History   Occupation: Museum/gallery curator at [employer]    Employer: [employer]: disabled    Employer: RETIRED  Tobacco Use   Smoking status: Former    Packs/day: 0.25    Years: 15.00    Pack years: 3.75    Types: Cigarettes    Start date: 02/26/1966    Quit date: 07/01/1983    Years since quitting: 37.5   Smokeless tobacco: Never   Tobacco comments:    quit in 1984  Vaping Use   Vaping Use: Never used  Substance and Sexual Activity   Alcohol  use: No    Alcohol/week: 0.0 standard drinks   Drug use: No   Sexual activity: Yes    Birth control/protection: None, Surgical  Other Topics Concern   Not on file  Social History Narrative   Sedentary   4 children, "blended family"   Social Determinants of Health   Financial Resource Strain: Not  on file  Food Insecurity: Not on file  Transportation Needs: Not on file  Physical Activity: Not on file  Stress: Not on file  Social Connections: Not on file  Intimate Partner Violence: Not on file    Family History:    Family History  Problem Relation Age of Onset   Hypertension Mother    Alzheimer's disease Mother    Stroke Mother    Heart attack Mother    Hypertension Other    Breast cancer Sister    Heart disease Neg Hx    Colon cancer Neg Hx      ROS:  Please see the history of present illness.   All other ROS reviewed and negative.     Physical Exam/Data:   Vitals:   12/24/20 0645 12/24/20 0700 12/24/20 0715 12/24/20 0736  BP: (!) 125/91 (!) 166/67 (!) 132/52   Pulse: 66 61 65 69  Resp: 19 18 (!) 21 (!) 21  Temp: (!) 97.34 F (36.3 C) (!) 97.16 F (36.2 C) (!) 96.98 F (36.1 C) (!) 96.62 F (35.9 C)  TempSrc:    Bladder  SpO2: 96% 98% 93% 100%  Weight:      Height:        Intake/Output Summary (Last 24 hours) at 12/24/2020 0900 Last data filed at 12/24/2020 0814 Gross per 24 hour  Intake 1153.54 ml  Output 225 ml  Net 928.54 ml   Last 3 Weights 12/23/2020 12/21/2020 12/21/2020  Weight (lbs) 231 lb 7.7 oz 228 lb 2.8 oz 227 lb  Weight (kg) 105 kg 103.5 kg 102.967 kg     Body mass index is 37.36 kg/m.  General:  Elderly female, lying in bed. Receiving breathing treatment.  HEENT: normal Lymph: no adenopathy Neck: no JVD Endocrine:  No thryomegaly Vascular: No carotid bruits; FA pulses 2+ bilaterally without bruits  Cardiac:  normal S1, S2; Irregularly irregular. no murmur  Lungs: rhonchi throughout upper lung fields.  Abd: soft, nontender, no hepatomegaly  Ext: 1+ pitting edema Musculoskeletal:  No deformities, BUE and BLE strength normal and equal Skin: warm and dry  Neuro:  CNs 2-12 intact, no focal abnormalities noted  EKG:  The EKG was personally reviewed and demonstrates: Atrial fibrillation, HR 96 with IVCD and baseline artifact.   Telemetry:  Telemetry was personally reviewed and demonstrates: Atrial fibrillation with RVR yesterday afternoon, improved overnight and this AM with HR in the 70's to 90's. Occasional PVC's.   Relevant CV Studies:  Echocardiogram: 08/2020 IMPRESSIONS     1. Left ventricular ejection fraction, by estimation, is 50 to 55%. The  left ventricle has normal function. The left ventricle has no regional  wall motion abnormalities. There is severe left ventricular hypertrophy.  Left ventricular diastolic parameters   are indeterminate.   2. Right ventricular systolic function is mildly reduced. The right  ventricular size is mildly enlarged.   3. Left atrial size was severely dilated.   4. Right atrial size was severely dilated.   5. The mitral valve is normal in structure. Mild mitral valve  regurgitation. No evidence of mitral stenosis.   6. The tricuspid valve is abnormal.  Tricuspid valve regurgitation is  moderate.   7. The aortic valve is tricuspid. Aortic valve regurgitation is not  visualized. No aortic stenosis is present.   8. The inferior vena cava is normal in size with <50% respiratory  variability, suggesting right atrial pressure of 8 mmHg.    Cardiac Catheterization: 08/2020 FINDINGS   Right Heart Catheterization     RA mean: 20 mmHg  RV: 80/16 (21) mmHg  PA (mean): 77/38 (53) mmHg  PCWP mean: 27 mmHg  PA Sat: 63 %  Art Sat: 97 %  PVR: 3.9 woods units  Fick Cardiac output: 6.1 L/min  Fick Cardiac index: 2.9 L/min/m2     Left Heart Catheterization  Left LVEDP: 27 mmHg     Coronary Angiography          Aortic Pressure:          119/78 mmHg   Coronary Angiography  Dominance: Right   Left Main: The left main coronary artery (LMCA) is a large-caliber vessel  that originates from the left coronary sinus. It bifurcates into the left  anterior descending (LAD) and left circumflex (LCx) arteries. There is no  angiographic evidence of significant disease in the  LMCA.   LAD: The LAD is a large-caliber vessel that gives off 1 diagonal (D)  branches before it wraps around the apex. D1 is a small-caliber vessel.  There is no angiographic evidence of significant disease in the LAD.   Left Circumflex: The LCx is a large-caliber vessel that gives off 2 obtuse  marginal (OM) branches and then continues as a small vessel in the AV  groove. OM1 is a moderate-caliber vessel. OM2 is a small-caliber vessel.    There is no angiographic evidence of significant disease in the LCx.   Right Coronary: The right coronary artery (RCA) is a large-caliber vessel  originating from the right coronary sinus. It bifurcates distally into a  large-caliber posterior descending artery (PDA) and large posterolateral  (PL) branches consistent with a right dominant system. There is no  angiographic evidence of significant disease in the RCA.   Complications:  None  Blood loss:  Minimal  Specimen:  None  Device/Implants:  None   Pre-procedure Dx:  NSTEMI, HFrEF  Post-procedure Dx:  No significant coronary artery disease   Findings:  1. No significant coronary artery disease.  2. Elevated right and left ventricular filling pressures (LVEDP = 27 mm  Hg).  3. Severe pulmonary hypertension   Limited Echocardiogram: 11/2020 IMPRESSIONS     1. Left ventricular ejection fraction, by estimation, is 55 to 60%. The  left ventricle has normal function. There is moderate left ventricular  hypertrophy. Left ventricular diastolic parameters are indeterminate.   2. Right ventricular systolic function is normal. The right ventricular  size is normal. There is severely elevated pulmonary artery systolic  pressure.   3. Left atrial size was severely dilated.   4. Right atrial size was severely dilated.   5. Mild mitral valve regurgitation. No evidence of mitral stenosis.  Moderate mitral annular calcification.   6. The tricuspid valve is abnormal. Tricuspid valve regurgitation is   moderate.   7. The aortic valve is tricuspid. There is mild calcification of the  aortic valve. There is mild thickening of the aortic valve. Aortic valve  regurgitation is not visualized. No aortic stenosis is present.   8. Severe pulmonary HTN, PASP is 71 mmHg.   9. The inferior vena cava is dilated in size with <50% respiratory  variability, suggesting right atrial pressure of 15 mmHg.  10. Limited echo   Laboratory Data:  High Sensitivity Troponin:   Recent Labs  Lab 12/21/20 0953 12/21/20 1157  TROPONINIHS 113* 105*     Chemistry Recent Labs  Lab 12/21/20 0953 12/22/20 0652 12/23/20 0340  NA 140 142 142  K 3.9 3.9 3.1*  CL 96* 100 102  CO2 36* 31 30  GLUCOSE 88 185* 139*  BUN '15 22 22  ' CREATININE 0.98 1.24* 1.15*  CALCIUM 8.4* 8.4* 8.1*  GFRNONAA >60 47* 51*  ANIONGAP '8 11 10    ' Recent Labs  Lab 12/21/20 0953  PROT 6.2*  ALBUMIN 3.2*  AST 17  ALT 13  ALKPHOS 56  BILITOT 0.4   Hematology Recent Labs  Lab 12/21/20 0953 12/22/20 0652 12/23/20 0340  WBC 5.7 7.6 10.4  RBC 3.28* 3.10* 3.26*  HGB 9.5* 8.8* 9.4*  HCT 32.4* 28.7* 30.3*  MCV 98.8 92.6 92.9  MCH 29.0 28.4 28.8  MCHC 29.3* 30.7 31.0  RDW 16.3* 16.5* 16.8*  PLT 221 182 219   BNP Recent Labs  Lab 12/21/20 0953  BNP 453.0*    DDimer No results for input(s): DDIMER in the last 168 hours.   Radiology/Studies:  DG CHEST PORT 1 VIEW  Result Date: 12/22/2020 CLINICAL DATA:  Status post PICC line placement EXAM: PORTABLE CHEST 1 VIEW COMPARISON:  12/21/2020 FINDINGS: Cardiac shadow is enlarged but stable. Aortic calcifications are again seen. Endotracheal tube and gastric catheter are again noted and stable. New right-sided PICC line is seen with the catheter tip in the distal superior vena cava. Patchy airspace opacities are again identified bilaterally stable from the prior exam. IMPRESSION: PICC line in satisfactory position. Remainder of the exam is stable. Electronically Signed   By:  Inez Catalina M.D.   On: 12/22/2020 16:20   Portable Chest x-ray  Result Date: 12/21/2020 CLINICAL DATA:  Intubation.  Short of breath. EXAM: PORTABLE CHEST 1 VIEW COMPARISON:  12/21/2020 at 10 a.m. FINDINGS: Endotracheal tube tip projects 2.4 cm above the carina. Nasal/orogastric tube passes well below the diaphragm, curling within the stomach, tip below the included field of view. Cardiac silhouette is enlarged. There is persistent vascular congestion with bilateral interstitial opacities. No new lung abnormalities. IMPRESSION: 1. Well-positioned endotracheal tube and nasal/orogastric tube. 2. No change in the appearance of the lungs or cardiomegaly. Suspect congestive heart failure. Electronically Signed   By: Lajean Manes M.D.   On: 12/21/2020 16:20   DG Chest Port 1 View  Result Date: 12/21/2020 CLINICAL DATA:  Cough, shortness of breath EXAM: PORTABLE CHEST 1 VIEW COMPARISON:  11/21/2020 FINDINGS: Bilateral diffuse interstitial thickening. No focal consolidation. No pleural effusion or pneumothorax. Stable cardiomegaly. No acute osseous abnormality. IMPRESSION: Cardiomegaly with mild pulmonary vascular congestion. Electronically Signed   By: Kathreen Devoid   On: 12/21/2020 10:33   [place] EKG SITE RITE  Result Date: 12/22/2020 If Site Rite image not attached, placement could not be confirmed due to current cardiac rhythm.    Assessment and Plan:   1. Atrial Fibrillation with RVR - She likely has permanent atrial fibrillation by review of notes and given severe atrial dilation by recent echocardiogram. Was agitated while intubated and had atrial fibrillation with RVR. Rates have since improved into the 70's to 90's overnight and this morning. Would continue PO Amiodarone 256m daily for now which she was on as an outpatient. If refusing PO or unable to take, could switch to IV. If she  has recurrent elevated rates, could transiently use IV Amiodarone as well. Would avoid BB or CCB for now given  intermittent hypotension and use of pressor support.  - Her CHA2DS2-VASc Score is at least 6. She was on Eliquis 61m BID as an outpatient and is now receiving full-dose Lovenox in case invasive procedures are indicated this admission.   2. Elevated Troponin Values - Hs Troponin values were at 113 and 105 this admission. Most consistent with demand ischemia in the setting of her acute hypoxic respiratory failure. Recent cath in 08/2020 showed normal cors by review of Care Everywhere. No plans for further ischemic testing this admission. Will remove order for repeat echocardiogram as this was just performed last month.   3. HFimpEF - Her EF was reported at 30% in 08/2020 and cath showed normal cors, EF improved to 55-60% by echocardiogram in 11/2020. BNP was elevated to 453 on admission and she initially received IV Lasix but this was held given hypotension and she received IVF bolus and was just started on additional IVF by the admitting team this morning. Her weight was up to 231 lbs on most recent check and her baseline is between 214 - 217 lbs. Would be cautious with IVF and plan to restart diuretic therapy soon. Wean pressor support as able. As above, her daughter feels she responds better to PO Torsemide as compared to PO Lasix so would anticipate switching back to Torsemide at discharge.   4. Pulmonary HTN - Severe by RHC in 08/2020 and most recent echo. RHC showed PA (mean): 77/38 (53) mmHg, PCWP mean: 27 mmHg  and PA Sat: 63 %. Likely due to her underlying lung disease as EF had normalized by most recent echocardiogram.   5. Acute on Chronic Hypoxic and Hypercarbic Respiratory Failure - Required intubation on admission due to not keeping BiPAP in place. Extubated yesterday and saturations have been stable this AM, still on 13 L [place] though. Palliative Care is following for goals of care discussion but by review of notes and talking with the Palliative Care provider, her family wishes to continue  full scope of care including re-intubation or trach placement if necessary. Management per Pulmonology and the admitting team.    Risk Assessment/Risk Scores:    CHA2DS2-VASc Score = 6  This indicates a 9.7% annual risk of stroke. The patient's score is based upon: CHF History: Yes HTN History: Yes Diabetes History: Yes Stroke History: No Vascular Disease History: Yes Age Score: 1 Gender Score: 1     For questions or updates, please contact [place] consult [email or web address] for contact info under    Signed, BErma Heritage PA-C  12/24/2020 9:00 AM

## 2020-12-24 NOTE — Progress Notes (Signed)
PROGRESS NOTE    [name]  YTW:[id number] DOB: 13-Mar-1951 DOA: 12/21/2020 PCP: Lemmie Evens, MD   Subjective:  The patient was seen and examined this morning, agitated, fidgety, in bed, responding to her name. Daughter present at bedside  Status postextubation 12/23/2020  Currently satting 93% on 4 L via nasal cannula  Brief Narrative:   [name] is a 71 y.o. female with medical history significant for obesity, type 2 diabetes, chronic atrial fibrillation on Eliquis, dementia, diastolic CHF with chronic hypoxemia on 2 L nasal cannula, hypothyroidism, seizure disorder, CKD stage III, CAD with prior NSTEMI, dyslipidemia, and GERD who presented to the ED with worsening shortness of breath over the past couple days.  She was admitted with acute on chronic hypoxemic and hypercarbic respiratory failure in the setting of CHF exacerbation.  BiPAP was initially attempted, but she could not tolerate this and therefore was intubated.  PCCM now following for ventilator management.  She is having hypotension requiring pressor use.  Assessment & Plan:   Active Problems:   Atrial fibrillation with RVR (HCC)   Acute respiratory failure with hypoxia and hypercapnia (HCC)   Acute hypoxemic respiratory failure (HCC)   Elevated troponin   Acute on chronic hypoxemic and hypercarbic respiratory failure -Extubated 12/23/2020 -Maintaining O2 sat greater 92% on O2 via nasal cannula, 4 L  -Continues to remain intubated, PCCM has suggested palliative consultation -Has had prior intubations and tracheostomy -Noted to have pulmonary arterial hypertension on prior CT 11/2020 -COVID testing negative; vaccinated and boosted -IV Lasix twice daily currently held due to hypotension -Monitor in ICU   Acute on chronic diastolic CHF exacerbation -Recent 2D echocardiogram 5/22 with LVEF 55-60% and indeterminate diastolic parameters -Holding IV Lasix at this time due to hypotension -Still hypotensive-on  pressors -Receiving IV fluid boluses on account of this -Strict I's and O's -Monitor daily weights -Gentle IV fluid was initiated due to hypotension, infection, will be discontinued as increasing work of breathing  Hypotension likely related to sedation -Continue gentle IV fluid hydration --- will be discontinued -Blood pressure improving, weaning off Levophed -Started on norepinephrine and PICC line placed 6/21... Discontinued 12/24/2020   Elevated troponin -Likely secondary to above -Continue to trend and monitor   Chronic atrial fibrillation- -Tachycardic, -Continue amiodarone and full dose Lovenox -Cardiology consulted, currently poor p.o. intake, if RVR develops, considering amiodarone IV  Type 2 diabetes -Recent hemoglobin A1c 7.6% -We will encourage p.o. intake, initiating CBG every 4 hours, with SSI coverage   AKI on CKD stage IIIa -Continue to monitor -Status post treatment with IV fluid -Responded to IV fluids,-discontinued   CAD with prior NSTEMI/dyslipidemia -Continue statin -   Anemia of chronic disease -Baseline hemoglobin near 10, transfuse for hemoglobin less than 7 -Current hemoglobin 8.8, >> 9.4, stable  OSA/OHS -Patient intolerant to CPAP   Hypothyroidism -Recent TSH 0.526 -Continue Synthroid per tube   History of seizures -Continue Keppra, now IV   History of dementia -Continue Aricept   Stage I sacral ulcer -Present on admission -Wound care   Obesity -BMI 36.64 -Lifestyle changes outpatient   DVT prophylaxis: Full dose Lovenox Code Status: Full -discussed with daughter at bedside ... Palliative care also has discussed patient care in detail along with Dr. Melvyn Novas.... She continues to have unrealistic expectations, such as reintubation and tracheostomy It was explained to patient daughter that once patient has tracheostomy will be very difficult to discharge her home likely will end up and LTAC Also was explained that tracheostomy  would  not help the underlying issue for lung and heart disease.   Family Communication: Daughter, [name] at bedside 6/23 Disposition Plan:  Status is: Inpatient  Remains inpatient appropriate because:Hemodynamically unstable, IV treatments appropriate due to intensity of illness or inability to take PO, and Inpatient level of care appropriate due to severity of illness  Dispo: The patient is from: Home              Anticipated d/c is to: SNF              Patient currently is not medically stable to d/c.   Difficult to place patient No  Skin Assessment:  I have examined the patient's skin and I agree with the wound assessment as performed by the wound care RN as outlined below:  Pressure Injury 11/21/20 Sacrum Medial Stage 1 -  Intact skin with non-blanchable redness of a localized area usually over a bony prominence. Stage 1 pressure Ulcer non blanchable (Active)  11/21/20 2259  Location: Sacrum  Location Orientation: Medial  Staging: Stage 1 -  Intact skin with non-blanchable redness of a localized area usually over a bony prominence.  Wound Description (Comments): Stage 1 pressure Ulcer non blanchable  Present on Admission: Yes    Consultants:  PCCM Palliative  Procedures:  Intubation 6/20 PICC line 6/21 See below  Antimicrobials:  Anti-infectives (From admission, onward)    Start     Dose/Rate Route Frequency Ordered Stop   12/23/20 1400  ceFAZolin (ANCEF) IVPB 1 g/50 mL premix        1 g 100 mL/hr over 30 Minutes Intravenous Every 8 hours 12/23/20 1306          Objective: Vitals:   12/24/20 1107 12/24/20 1145 12/24/20 1215 12/24/20 1230  BP: 120/80 131/69 (!) 122/58 (!) 125/99  Pulse: 75 71 65 65  Resp: 16 (!) 23 (!) 24 14  Temp: (!) 96.98 F (36.1 C) (!) 96.98 F (36.1 C) (!) 96.98 F (36.1 C) (!) 96.98 F (36.1 C)  TempSrc:      SpO2: (!) 87% 91% 90% 93%  Weight:      Height:        Intake/Output Summary (Last 24 hours) at 12/24/2020 1326 Last data  filed at 12/24/2020 6468 Gross per 24 hour  Intake 964.71 ml  Output 150 ml  Net 814.71 ml   Filed Weights   12/21/20 0934 12/21/20 2206 12/23/20 0516  Weight: 103 kg 103.5 kg 105 kg       Physical Exam:   General:    HEENT:  Normocephalic, PERRL, otherwise with in Normal limits   Neuro:  CNII-XII intact. , normal motor and sensation, reflexes intact   Lungs:   Clear to auscultation BL, Respirations unlabored, no wheezes / crackles  Cardio:    S1/S2, RRR, No murmure, No Rubs or Gallops   Abdomen:   Soft, non-tender, bowel sounds active all four quadrants,  no guarding or peritoneal signs.  Muscular skeletal:  Limited exam - in bed, able to move all 4 extremities, Normal strength,  2+ pulses,  symmetric, No pitting edema  Skin:  Dry, warm to touch, negative for any Rashes,  Wounds: Please see nursing documentation  Pressure Injury 11/21/20 Sacrum Medial Stage 1 -  Intact skin with non-blanchable redness of a localized area usually over a bony prominence. Stage 1 pressure Ulcer non blanchable (Active)  11/21/20 2259  Location: Sacrum  Location Orientation: Medial  Staging: Stage 1 -  Intact skin  with non-blanchable redness of a localized area usually over a bony prominence.  Wound Description (Comments): Stage 1 pressure Ulcer non blanchable  Present on Admission: Yes          Data Reviewed: I have personally reviewed following labs and imaging studies  CBC: Recent Labs  Lab 12/21/20 0953 12/22/20 0652 12/23/20 0340  WBC 5.7 7.6 10.4  NEUTROABS 3.6  --   --   HGB 9.5* 8.8* 9.4*  HCT 32.4* 28.7* 30.3*  MCV 98.8 92.6 92.9  PLT 221 182 407   Basic Metabolic Panel: Recent Labs  Lab 12/21/20 0953 12/22/20 0652 12/23/20 0340  NA 140 142 142  K 3.9 3.9 3.1*  CL 96* 100 102  CO2 36* 31 30  GLUCOSE 88 185* 139*  BUN _0 CREATININE 0.98 1.24* 1.15*  CALCIUM 8.4* 8.4* 8.1*  MG  --  2.1 1.9   GFR: Estimated Creatinine Clearance: 55 mL/min (A) (by C-G  formula based on SCr of 1.15 mg/dL (H)). Liver Function Tests:  Recent Labs  Lab 12/23/20 1951 12/24/20 0027 12/24/20 0454 12/24/20 0734 12/24/20 1151  GLUCAP 187* 102* 161* 152* 89   Lipid Profile: Recent Labs    12/22/20 0652  TRIG 29   Thyroid Function Tests: No results for input(s): TSH, T4TOTAL, FREET4, T3FREE, THYROIDAB in the last 72 hours. Anemia Panel: No results for input(s): VITAMINB12, FOLATE, FERRITIN, TIBC, IRON, RETICCTPCT in the last 72 hours. Sepsis Labs: No results for input(s): PROCALCITON, LATICACIDVEN in the last 168 hours.  Recent Results (from the past 240 hour(s))  MRSA Next Gen by PCR, Nasal     Status: None   Collection Time: 12/21/20  9:20 AM   Specimen: Nasal Mucosa; Nasal Swab  Result Value Ref Range Status   MRSA by PCR Next Gen NOT DETECTED NOT DETECTED Final    Comment: (NOTE) The GeneXpert MRSA Assay (FDA approved for NASAL specimens only), is one component of a comprehensive MRSA colonization surveillance program. It is not intended to diagnose MRSA infection nor to guide or monitor treatment for MRSA infections. Test performance is not FDA approved in patients less than 39 years old. Performed at [facility], [address], [place], [place] [address]   Resp Panel by RT-PCR (Flu A&B, Covid) Nasopharyngeal Swab     Status: None   Collection Time: 12/21/20 11:45 AM   Specimen: Nasopharyngeal Swab; Nasopharyngeal(NP) swabs in vial transport medium  Result Value Ref Range Status   SARS Coronavirus 2 by RT PCR NEGATIVE NEGATIVE Final    Comment: (NOTE) SARS-CoV-2 target nucleic acids are NOT DETECTED.  The SARS-CoV-2 RNA is generally detectable in upper respiratory specimens during the acute phase of infection. The lowest concentration of SARS-CoV-2 viral copies this assay can detect is 138 copies/mL. A negative result does not preclude SARS-Cov-2 infection and should not be used as the sole basis for treatment or other patient  management decisions. A negative result may occur with  improper specimen collection/handling, submission of specimen other than nasopharyngeal swab, presence of viral mutation(s) within the areas targeted by this assay, and inadequate number of viral copies(<138 copies/mL). A negative result must be combined with clinical observations, patient history, and epidemiological information. The expected result is Negative.  Fact Sheet for Patients:  EntrepreneurPulse.com.au  Fact Sheet for Healthcare Providers:  IncredibleEmployment.be  This test is no t yet approved or cleared by the [place] FDA and  has been authorized for detection and/or diagnosis of SARS-CoV-2 by FDA  under an Emergency Use Authorization (EUA). This EUA will remain  in effect (meaning this test can be used) for the duration of the COVID-19 declaration under Section 564(b)(1) of the Act, 21 U.S.C.section 360bbb-3(b)(1), unless the authorization is terminated  or revoked sooner.       Influenza A by PCR NEGATIVE NEGATIVE Final   Influenza B by PCR NEGATIVE NEGATIVE Final    Comment: (NOTE) The Xpert Xpress SARS-CoV-2/FLU/RSV plus assay is intended as an aid in the diagnosis of influenza from Nasopharyngeal swab specimens and should not be used as a sole basis for treatment. Nasal washings and aspirates are unacceptable for Xpert Xpress SARS-CoV-2/FLU/RSV testing.  Fact Sheet for Patients: EntrepreneurPulse.com.au  Fact Sheet for Healthcare Providers: IncredibleEmployment.be  This test is not yet approved or cleared by the [place] FDA and has been authorized for detection and/or diagnosis of SARS-CoV-2 by FDA under an Emergency Use Authorization (EUA). This EUA will remain in effect (meaning this test can be used) for the duration of the COVID-19 declaration under Section 564(b)(1) of the Act, 21 U.S.C. section 360bbb-3(b)(1),  unless the authorization is terminated or revoked.  Performed at [facility], [address], [place], [place] [address]   Culture, Urine     Status: Abnormal   Collection Time: 12/21/20  4:43 PM   Specimen: Urine, Random  Result Value Ref Range Status   Specimen Description   Final    URINE, RANDOM Performed at [facility], [address] [place], [place] [address]    Special Requests   Final    NONE Performed at [facility], [address] [place], [place] [address]    Culture >=100,000 COLONIES/mL ESCHERICHIA COLI (A)  Final   Report Status 12/24/2020 FINAL  Final   Organism ID, Bacteria ESCHERICHIA COLI (A)  Final      Susceptibility   Escherichia coli - MIC*    AMPICILLIN <=2 SENSITIVE Sensitive     CEFAZOLIN <=4 SENSITIVE Sensitive     CEFEPIME <=0.12 SENSITIVE Sensitive     CEFTRIAXONE <=0.25 SENSITIVE Sensitive     CIPROFLOXACIN <=0.25 SENSITIVE Sensitive     GENTAMICIN <=1 SENSITIVE Sensitive     IMIPENEM <=0.25 SENSITIVE Sensitive     NITROFURANTOIN <=16 SENSITIVE Sensitive     TRIMETH/SULFA <=20 SENSITIVE Sensitive     AMPICILLIN/SULBACTAM <=2 SENSITIVE Sensitive     PIP/TAZO <=4 SENSITIVE Sensitive     * >=100,000 COLONIES/mL ESCHERICHIA COLI         Radiology Studies: DG CHEST PORT 1 VIEW  Result Date: 12/22/2020 CLINICAL DATA:  Status post PICC line placement EXAM: PORTABLE CHEST 1 VIEW COMPARISON:  12/21/2020 FINDINGS: Cardiac shadow is enlarged but stable. Aortic calcifications are again seen. Endotracheal tube and gastric catheter are again noted and stable. New right-sided PICC line is seen with the catheter tip in the distal superior vena cava. Patchy airspace opacities are again identified bilaterally stable from the prior exam. IMPRESSION: PICC line in satisfactory position. Remainder of the exam is stable. Electronically Signed   By: Inez Catalina M.D.   On: 12/22/2020 16:20        Scheduled Meds:  amiodarone  200 mg Per Tube q morning    atorvastatin  80 mg Per Tube Daily   chlorhexidine gluconate (MEDLINE KIT)  15 mL Mouth Rinse BID   Chlorhexidine Gluconate Cloth  6 each Topical Daily   cholecalciferol  2,000 Units Per Tube Daily   docusate  100 mg Per Tube BID  donepezil  5 mg Per Tube Daily   DULoxetine  30 mg Oral BID   enoxaparin (LOVENOX) injection  100 mg Subcutaneous Q12H   guaiFENesin-dextromethorphan  10 mL Oral Q8H   insulin aspart  0-15 Units Subcutaneous Q4H   ipratropium  0.5 mg Nebulization TID   levalbuterol  0.63 mg Nebulization TID   levothyroxine  175 mcg Per Tube Daily   mouth rinse  15 mL Mouth Rinse BID   multivitamin  15 mL Per Tube Daily   pantoprazole (PROTONIX) IV  40 mg Intravenous Q12H   polyethylene glycol  17 g Per Tube Daily   potassium chloride  20 mEq Per Tube Daily   sodium chloride flush  10-40 mL Intracatheter Q12H   sodium chloride flush  3 mL Intravenous Q12H   Continuous Infusions:  sodium chloride 10 mL/hr at 12/24/20 4239    ceFAZolin (ANCEF) IV 1 g (12/24/20 1317)   dexmedetomidine (PRECEDEX) IV infusion 1.2 mcg/kg/hr (12/24/20 1130)   lactated ringers 125 mL/hr at 12/24/20 0856   levETIRAcetam 1,000 mg (12/24/20 1128)   norepinephrine (LEVOPHED) Adult infusion 2 mcg/min (12/24/20 0853)     LOS: 3 days    Time spent: 55 minutes of critical time was spent seeing evaluating patient, discussing care with consultants, palliative care, family meeting, planning current medical care... Adjusting medications,    Deatra James, MD [facility]  If 7PM-7AM, please contact night-coverage [email or web address] 12/24/2020, 1:26 PM

## 2020-12-25 ENCOUNTER — Inpatient Hospital Stay (HOSPITAL_COMMUNITY): Payer: PPO

## 2020-12-25 DIAGNOSIS — D6489 Other specified anemias: Secondary | ICD-10-CM

## 2020-12-25 LAB — COMPREHENSIVE METABOLIC PANEL
ALT: 16 U/L (ref 0–44)
AST: 26 U/L (ref 15–41)
Albumin: 2.6 g/dL — ABNORMAL LOW (ref 3.5–5.0)
Alkaline Phosphatase: 43 U/L (ref 38–126)
Anion gap: 5 (ref 5–15)
BUN: 23 mg/dL (ref 8–23)
CO2: 33 mmol/L — ABNORMAL HIGH (ref 22–32)
Calcium: 8.1 mg/dL — ABNORMAL LOW (ref 8.9–10.3)
Chloride: 107 mmol/L (ref 98–111)
Creatinine, Ser: 0.86 mg/dL (ref 0.44–1.00)
GFR, Estimated: >60 mL/min (ref >60)
Glucose, Bld: 110 mg/dL — ABNORMAL HIGH (ref 70–99)
Potassium: 3.7 mmol/L (ref 3.5–5.1)
Sodium: 145 mmol/L (ref 135–145)
Total Bilirubin: 0.7 mg/dL (ref 0.3–1.2)
Total Protein: 4.9 g/dL — ABNORMAL LOW (ref 6.5–8.1)

## 2020-12-25 LAB — TRIGLYCERIDES: Triglycerides: 50 mg/dL (ref <150)

## 2020-12-25 LAB — MAGNESIUM
Magnesium: 2 mg/dL (ref 1.7–2.4)
Magnesium: 2 mg/dL (ref 1.7–2.4)

## 2020-12-25 LAB — GLUCOSE, CAPILLARY
Glucose-Capillary: 110 mg/dL — ABNORMAL HIGH (ref 70–99)
Glucose-Capillary: 133 mg/dL — ABNORMAL HIGH (ref 70–99)
Glucose-Capillary: 148 mg/dL — ABNORMAL HIGH (ref 70–99)
Glucose-Capillary: 158 mg/dL — ABNORMAL HIGH (ref 70–99)
Glucose-Capillary: 178 mg/dL — ABNORMAL HIGH (ref 70–99)
Glucose-Capillary: 198 mg/dL — ABNORMAL HIGH (ref 70–99)
Glucose-Capillary: 199 mg/dL — ABNORMAL HIGH (ref 70–99)

## 2020-12-25 LAB — CBC
HCT: 27.1 % — ABNORMAL LOW (ref 36.0–46.0)
Hemoglobin: 7.9 g/dL — ABNORMAL LOW (ref 12.0–15.0)
MCH: 27.8 pg (ref 26.0–34.0)
MCHC: 29.2 g/dL — ABNORMAL LOW (ref 30.0–36.0)
MCV: 95.4 fL (ref 80.0–100.0)
Platelets: 131 10*3/uL — ABNORMAL LOW (ref 150–400)
RBC: 2.84 MIL/uL — ABNORMAL LOW (ref 3.87–5.11)
RDW: 16.4 % — ABNORMAL HIGH (ref 11.5–15.5)
WBC: 5.2 10*3/uL (ref 4.0–10.5)
nRBC: 0 % (ref 0.0–0.2)

## 2020-12-25 LAB — PHOSPHORUS
Phosphorus: 3.8 mg/dL (ref 2.5–4.6)
Phosphorus: 3.7 mg/dL (ref 2.5–4.6)

## 2020-12-25 MED ORDER — PROSOURCE TF PO LIQD
45.0000 mL | Freq: Four times a day (QID) | ORAL | Status: DC
  Filled 2020-12-25: qty 45

## 2020-12-25 MED ORDER — OSMOLITE 1.5 CAL PO LIQD: 1000.0000 mL | ORAL | Status: DC

## 2020-12-25 MED ORDER — LEVALBUTEROL HCL 0.63 MG/3ML IN NEBU
0.6300 mg | INHALATION_SOLUTION | Freq: Two times a day (BID) | RESPIRATORY_TRACT | Status: DC
  Administered 2020-12-25 – 2020-12-30 (×10): 0.63 mg via RESPIRATORY_TRACT
  Filled 2020-12-25 (×10): qty 3

## 2020-12-25 MED ORDER — MELATONIN 3 MG PO TABS
3.0000 mg | ORAL_TABLET | Freq: Every day | ORAL | Status: DC
  Administered 2020-12-25 – 2020-12-29 (×5): 3 mg via ORAL
  Filled 2020-12-25 (×5): qty 1

## 2020-12-25 MED ORDER — DEXTROSE-NACL 5-0.45 % IV SOLN: INTRAVENOUS | Status: DC

## 2020-12-25 MED ORDER — IPRATROPIUM BROMIDE 0.02 % IN SOLN
0.5000 mg | Freq: Two times a day (BID) | RESPIRATORY_TRACT | Status: DC
  Administered 2020-12-25 – 2020-12-30 (×10): 0.5 mg via RESPIRATORY_TRACT
  Filled 2020-12-25 (×10): qty 2.5

## 2020-12-25 NOTE — Evaluation (Signed)
Clinical/Bedside Swallow Evaluation Patient Details  Name: [name] MRN: [id number] Date of Birth: September 03, 1950  Today's Date: 12/25/2020 Time: SLP Start Time (ACUTE ONLY): 1345 SLP Stop Time (ACUTE ONLY): 1416 SLP Time Calculation (min) (ACUTE ONLY): 31 min  Past Medical History:  Past Medical History:  Diagnosis Date   Abnormal pulmonary function test    Anemia    H/H of 10/30 with a normal MCV in 12/09   Anxiety    Arthritis    Barrett's esophagus    Diagnosed 1995. Last EGD 2016-NO BARRETT'S.    Chest pain    Negative cardiac catheterization in 2002; negative stress nuclear study in 2008   Chronic anticoagulation    Chronic combined systolic and diastolic CHF (congestive heart failure) (HCC)    a. EF predominantly normal during prior echoes but was 40% during 10/2014 echo. b. Most recent 01/2015 EF normal, 55-60%.   Chronic LBP    Surgical intervention in 1996   Diabetes mellitus, type 2 (HCC)    Insulin therapy; exacerbated by prednisone   Dysrhythmia    AFib   Gastroparesis    99% retention 05/2008 on GES   GERD (gastroesophageal reflux disease)    Heart attack (HCC) 08/13/2020   Hiatal hernia    Hyperlipidemia    Hypertension    Hypothyroid    IBS (irritable bowel syndrome)    Obesity    OSA on CPAP    had CPAP and cannot tolerate.   Paroxysmal atrial fibrillation (HCC)    Pulmonary hypertension (HCC) 01/2015   a. Predominantly pulmonary venous hypertension but may be component of PAH.   Seizures (HCC)    last seizure was 2 years ago; on keppra for this; unknown etiology   Syncope    a. Admitted 05/2009; magnetic resonance imagin/ MRA - negative; etiology thought to be orthostasis secondary to drugs and dehydration. b. Syncope 02/2015 also felt 2/2 dehydration.   Past Surgical History:  Past Surgical History:  Procedure Laterality Date   BACK SURGERY  1996   BIOPSY N/A 11/08/2013   Procedure: BIOPSY  / Tissue sampling / ulcers present in small intestine;   Surgeon: West Bali, MD;  Location: [facility];  Service: Endoscopy;  Laterality: N/A;   CARDIAC CATHETERIZATION  2002   CARDIAC CATHETERIZATION N/A 01/26/2015   Procedure: Right Heart Cath;  Surgeon: Laurey Morale, MD;  Location[facility] INVASIVE CV LAB;  Service: Cardiovascular;  Laterality: N/A;   CARDIOVASCULAR STRESS TEST  2008   Stress nuclear study   CARDIOVERSION N/A 03/06/2015   Procedure: CARDIOVERSION;  Surgeon: Laurey Morale, MD;  Location: [facility] ENDOSCOPY;  Service: Cardiovascular;  Laterality: N/A;   CARPAL TUNNEL RELEASE  1994   COLONOSCOPY  11/2011   Dr. Darrick Penna: Internal hemorrhoids, mild diverticulosis. Random colon biopsies negative.   COLONOSCOPY N/A 11/08/2013   SLF: Normal mucosa in the terminal ileum/The colon IS redundant/  Moderate diverticulosis throughout the entire colon. ileum bx benign. colon bx benign   ESOPHAGOGASTRODUODENOSCOPY  2008   Barrett's without dysplasia. esphagus dilated. antral erosions, h.pylori serologies negative.   ESOPHAGOGASTRODUODENOSCOPY  11/2011   Dr. Darrick Penna: Barrett's esophagus, mild gastritis, diverticulum in the second portion of the duodenum repeat EGD 3 years. Small bowel biopsies negative. Gastric biopsy show reactive gastropathy but no H. pylori. Esophageal biopsies consistent with GERD. Next EGD 11/2014   ESOPHAGOGASTRODUODENOSCOPY N/A 11/21/2014   LZJ:QBHA non-erosive gastritis/irregular z-line   GIVENS CAPSULE STUDY  12/07/2011   Proximal small bowel, rare AVM.  Distal small bowel, multiple ulcers noted   GIVENS CAPSULE STUDY N/A 09/27/2013   Distal small bowel ulcers extending to TI.   GIVENS CAPSULE STUDY N/A 10/10/2013   Procedure: GIVENS CAPSULE STUDY;  Surgeon: West Bali, MD;  Location: [facility];  Service: Endoscopy;  Laterality: N/A;  7:30   IR KYPHO THORACIC WITH BONE BIOPSY  02/09/2018   KNEE ARTHROSCOPY WITH MEDIAL MENISECTOMY Right 06/09/2016   Procedure: KNEE ARTHROSCOPY WITH MEDIAL MENISECTOMY;  Surgeon: Vickki Hearing, MD;  Location: [facility] ORS;  Service: Orthopedics;  Laterality: Right;  medial and lateral menisectomy   LAMINECTOMY  1995   L4-L5   LAPAROSCOPIC CHOLECYSTECTOMY  1990s   LEFT HEART CATHETERIZATION WITH CORONARY ANGIOGRAM  01/10/2014   Procedure: LEFT HEART CATHETERIZATION WITH CORONARY ANGIOGRAM;  Surgeon: Lesleigh Noe, MD;  Location: [facility] CATH LAB;  Service: Cardiovascular;;   RIGHT HEART CATHETERIZATION N/A 01/10/2014   Procedure: RIGHT HEART CATH;  Surgeon: Lesleigh Noe, MD;  Location: [facility] CATH LAB;  Service: Cardiovascular;  Laterality: N/A;   TOTAL ABDOMINAL HYSTERECTOMY  1999   TRACHEOSTOMY TUBE PLACEMENT N/A 09/01/2017   Procedure: TRACHEOSTOMY;  Surgeon: Drema Halon, MD;  Location: [facility] OR;  Service: ENT;  Laterality: N/A;   HPI:  [name] is a 70 y.o. female with medical history significant for obesity, type 2 diabetes, chronic atrial fibrillation on Eliquis, dementia, diastolic CHF with chronic hypoxemia on 2 L nasal cannula, hypothyroidism, seizure disorder, CKD stage III, CAD with prior NSTEMI, dyslipidemia, and GERD who presented to the ED with worsening shortness of breath over the past couple days.  She was admitted with acute on chronic hypoxemic and hypercarbic respiratory failure in the setting of CHF exacerbation.  BiPAP was initially attempted, but she could not tolerate this and therefore was intubated. She was extubated 6/22 but has not been alert/stable/responsive to be appropriate for BSE despite multiple attempts until today.   Assessment / Plan / Recommendation Clinical Impression  Clinical swallowing evaluation completed today - Pt presents with a primary cognitive based dysphagia; With help from RN, SLP repositioned Pt to appropriate upright posture for swallowing. She did initially open her eyes and seemed to be trying to bite/eat the remote for the TV. Pt's eyes remained closed for the remainder of evaluation, but she was verbally responsive and  answering y/n questions. SLP initially presented single ice chips which Pt demonstrated appropriate and immediate oral manipulation followed by a seemingly swift swallow trigger upon palpation; this was noted with 5-6 ice chips. With tsp sips of water note delayed wet vocal qualitiy and some delayed coughing. Pt then consumed a cup of applesauce and tsp sips of HTL without overt s/sx of aspiration. After one bite she would open her mouth for another presentation and when asked if she wanted more she would respond with "yes".  Recommend initiate D1/puree diet with HTL. Note that Pt should only be fed if responsive and alert. With Pt's fluctuating mentation, Please note that PO should be held if she is agitated or not responding or alert. This was reviewed at length with [name] and RN. Further note following oral care, single ice chips are ok (in between meals -- if alert and requesting water). ST will continue to follow acutely. Thank you, SLP Visit Diagnosis: Dysphagia, unspecified (R13.10)    Aspiration Risk  Mild aspiration risk;Moderate aspiration risk (secondary to mentation)    Diet Recommendation Dysphagia 1 (Puree);Honey-thick liquid   Liquid Administration  via: Spoon Medication Administration: Crushed with puree Supervision: Full supervision/cueing for compensatory strategies Compensations: Minimize environmental distractions;Slow rate;Small sips/bites Postural Changes: Seated upright at 90 degrees    Other  Recommendations Oral Care Recommendations: Oral care BID Other Recommendations: Order thickener from pharmacy;Have oral suction available   Follow up Recommendations Skilled Nursing facility;24 hour supervision/assistance      Frequency and Duration min 2x/week  1 week       Prognosis Prognosis for Safe Diet Advancement: Good      Swallow Study   General Date of Onset: 12/21/20 HPI: RICKESHA VERACRUZ is a 70 y.o. female with medical history significant for obesity, type  2 diabetes, chronic atrial fibrillation on Eliquis, dementia, diastolic CHF with chronic hypoxemia on 2 L nasal cannula, hypothyroidism, seizure disorder, CKD stage III, CAD with prior NSTEMI, dyslipidemia, and GERD who presented to the ED with worsening shortness of breath over the past couple days.  She was admitted with acute on chronic hypoxemic and hypercarbic respiratory failure in the setting of CHF exacerbation.  BiPAP was initially attempted, but she could not tolerate this and therefore was intubated. She was extubated 6/22 but has not been alert/stable/responsive to be appropriate for BSE despite multiple attempts until today. Type of Study: Bedside Swallow Evaluation Previous Swallow Assessment: BSEs completed after being intubated earlier this year Diet Prior to this Study: NPO Temperature Spikes Noted: No Respiratory Status: Room air History of Recent Intubation: Yes Length of Intubations (days): 2 days Date extubated: 12/23/20 Behavior/Cognition: Cooperative;Pleasant mood Oral Cavity Assessment: Within Functional Limits Oral Care Completed by SLP: Recent completion by staff Oral Cavity - Dentition: Missing dentition Patient Positioning: Upright in bed Baseline Vocal Quality: Normal Volitional Cough: Cognitively unable to elicit Volitional Swallow: Unable to elicit    Oral/Motor/Sensory Function Overall Oral Motor/Sensory Function: Within functional limits   Ice Chips Ice chips: Within functional limits   Thin Liquid Thin Liquid: Impaired Presentation: Spoon Pharyngeal  Phase Impairments: Cough - Delayed    Nectar Thick Nectar Thick Liquid: Not tested   Honey Thick Honey Thick Liquid: Within functional limits   Puree Puree: Within functional limits   Solid     Solid: Not tested     Kimiyah Blick H. Romie Levee, CCC-SLP Speech Language Pathologist  Georgetta Haber 12/25/2020,2:16 PM

## 2020-12-25 NOTE — Progress Notes (Signed)
Initial Nutrition Assessment  DOCUMENTATION CODES:  Obesity unspecified  INTERVENTION:  If within GOC, recommend placement of small-bore NG tube and starting TF.  Initiate tube feeding via small-bore NG tube: Osmolite 1.5 at 20 ml/h and increase by 10 ml every 8 hrs to goal rate of 45 ml/h (1080 ml per day) Prosource TF 45 ml QID  Provides 1780 kcal, 112 gm protein, 821 ml free water daily.  Free water flushes for hydration per CCM.  Consider PEG placement for long-term nutrition.  NUTRITION DIAGNOSIS:  Inadequate oral intake related to inability to eat as evidenced by NPO status.  GOAL:  Patient will meet greater than or equal to 90% of their needs  MONITOR:  Diet advancement, Labs, Weight trends, TF tolerance, Skin, I & O's  REASON FOR ASSESSMENT:  Low Braden    ASSESSMENT:  70 yo female with a PMH of obesity, T2DM, chronic A-fib, dementia, diastolic CHF with chronic hypoxemia on 2 L nasal cannula, hypothyroidism, seizure disorder, CKD stage3, CAD with prior NSTEMI, dyslipidemia, and GERD who presents with acute on chronic hypoxemic and hypercarbic respiratory failure. 6/20 - venting NG/OG placed; intubated 6/22 - venting NG/OG removed; extubated  Spoke with daughter at bedside. Daughter reports that it is a struggle to get the pt to eat. She has to encourage and beg sometimes. She confirmed that if a feeding tube needs to be placed, that she is okay with it.  Per Epic, pt's weight had decreased but is now back to where her weight was last year. Of note, pt does have BLE edema. This may be contributing to the weight gain.  Pt at risk for malnutrition given dementia and agitation. Recommend starting TF as soon as possible. Communicated concerns to MD and RN for pt pulling out small-bore NG tube but given pt's poor prognosis, pt may not be a PEG candidate. RD to leave TF recommendations if it remains in GOC.  Medications: reviewed; Vitamin D3, colace BID, SSI, Synthroid,  liquid MVI, Protonix, miralax, Klor-Con 20 mEq, Ancef TID via IV, Precedex, D5 with NaCl @ 50 ml/hr, LR @ 75 ml/hr, Keppra TID via IV, Haldol PRN (given once today)  Labs: reviewed; CBG 110-148 HbA1c: 7.6% (11/2020)  NUTRITION - FOCUSED PHYSICAL EXAM: Flowsheet Row Most Recent Value  Orbital Region Mild depletion  Upper Arm Region No depletion  Thoracic and Lumbar Region No depletion  Buccal Region No depletion  Temple Region Mild depletion  Clavicle Bone Region No depletion  Clavicle and Acromion Bone Region No depletion  Scapular Bone Region No depletion  Dorsal Hand Mild depletion  Patellar Region No depletion  Anterior Thigh Region No depletion  Posterior Calf Region No depletion  Edema (RD Assessment) Mild  [BLE]  Hair Reviewed  Eyes Unable to assess  Mouth Reviewed  Skin Reviewed  Nails Reviewed   Diet Order:   Diet Order             Diet NPO time specified  Diet effective now                  EDUCATION NEEDS:  Education needs have been addressed  Skin:  Skin Assessment: Skin Integrity Issues: Skin Integrity Issues:: Other (Comment) Other: Skin tear, R arm  Last BM:  PTA/unknown - OBR in place  Height:  Ht Readings from Last 1 Encounters:  12/21/20 5\' 6"  (1.676 m)   Weight:  Wt Readings from Last 1 Encounters:  12/25/20 105.8 kg   Ideal Body Weight:  59.1  kg  BMI:  Body mass index is 37.65 kg/m.  Estimated Nutritional Needs:  Kcal:  1650-1850 Protein:  110-125 grams Fluid:  >1.65 L  Vertell Limber, RD, LDN (she/her/hers) Registered Dietitian I After-Hours/Weekend Pager # in [place]

## 2020-12-25 NOTE — Progress Notes (Addendum)
Progress Note  Patient Name: [name] Date of Encounter: 12/25/2020  [facility] Cardiologist: Carlyle Dolly, MD   Subjective   Patient moaning and moving around in the bed. Daughter at the bedside. Awaiting speech eval but still NPO.   Inpatient Medications    Scheduled Meds:  amiodarone  200 mg Per Tube q morning   atorvastatin  80 mg Per Tube Daily   chlorhexidine gluconate (MEDLINE KIT)  15 mL Mouth Rinse BID   Chlorhexidine Gluconate Cloth  6 each Topical Daily   cholecalciferol  2,000 Units Per Tube Daily   docusate  100 mg Per Tube BID   donepezil  5 mg Per Tube Daily   DULoxetine  30 mg Oral BID   guaiFENesin-dextromethorphan  10 mL Oral Q8H   insulin aspart  0-15 Units Subcutaneous Q4H   ipratropium  0.5 mg Nebulization TID   levalbuterol  0.63 mg Nebulization TID   levothyroxine  175 mcg Per Tube Daily   mouth rinse  15 mL Mouth Rinse BID   multivitamin  15 mL Per Tube Daily   pantoprazole (PROTONIX) IV  40 mg Intravenous Q12H   polyethylene glycol  17 g Per Tube Daily   potassium chloride  20 mEq Per Tube Daily   sodium chloride flush  10-40 mL Intracatheter Q12H   sodium chloride flush  3 mL Intravenous Q12H   Continuous Infusions:  sodium chloride Stopped (12/24/20 0854)    ceFAZolin (ANCEF) IV Stopped (12/25/20 8921)   dexmedetomidine (PRECEDEX) IV infusion 1.2 mcg/kg/hr (12/25/20 0704)   dextrose 5 % and 0.45% NaCl     lactated ringers 75 mL/hr at 12/25/20 0704   levETIRAcetam Stopped (12/24/20 2356)   PRN Meds: sodium chloride, acetaminophen **OR** acetaminophen, albuterol, haloperidol lactate, loperamide, ondansetron **OR** ondansetron (ZOFRAN) IV, sodium chloride flush, sodium chloride flush   Vital Signs    Vitals:   12/25/20 0400 12/25/20 0500 12/25/20 0600 12/25/20 0700  BP: (!) 142/91 (!) 137/94 (!) 144/91 102/78  Pulse: (!) 58 (!) 58 (!) 54 65  Resp: (!) 0 (!) 0 (!) 0 19  Temp: (!) 96.3 F (35.7 C)     TempSrc: Axillary      SpO2: 100% 100% 100% 98%  Weight:  105.8 kg    Height:        Intake/Output Summary (Last 24 hours) at 12/25/2020 0909 Last data filed at 12/25/2020 0704 Gross per 24 hour  Intake 2432.51 ml  Output 150 ml  Net 2282.51 ml   Last 3 Weights 12/25/2020 12/23/2020 12/21/2020  Weight (lbs) 233 lb 4 oz 231 lb 7.7 oz 228 lb 2.8 oz  Weight (kg) 105.8 kg 105 kg 103.5 kg      Telemetry    Atrial fibrillation, HR in 50's to 60's Nocturnal pauses up to 2.27 seconds. - Personally Reviewed  ECG    No new tracings.   Physical Exam   GEN: Elderly female, shifting around in bed.   Neck: No JVD Cardiac: Irregularly irregular, no murmurs, rubs, or gallops.  Respiratory: Rhonchi throughout upper lung fields.  GI: Soft, nontender, non-distended  MS: No pitting edema; SCD's in place. No deformity. Neuro:  Nonfocal  Psych: Moaning but will not answer questions.   Labs    High Sensitivity Troponin:   Recent Labs  Lab 12/21/20 0953 12/21/20 1157  TROPONINIHS 113* 105*      Chemistry Recent Labs  Lab 12/21/20 0953 12/22/20 0652 12/23/20 0340 12/25/20 0422  NA 140 142 142  145  K 3.9 3.9 3.1* 3.7  CL 96* 100 102 107  CO2 36* 31 30 33*  GLUCOSE 88 185* 139* 110*  BUN '15 22 22 23  ' CREATININE 0.98 1.24* 1.15* 0.86  CALCIUM 8.4* 8.4* 8.1* 8.1*  PROT 6.2*  --   --  4.9*  ALBUMIN 3.2*  --   --  2.6*  AST 17  --   --  26  ALT 13  --   --  16  ALKPHOS 56  --   --  43  BILITOT 0.4  --   --  0.7  GFRNONAA >60 47* 51* >60  ANIONGAP '8 11 10 5     ' Hematology Recent Labs  Lab 12/23/20 0340 12/24/20 1806 12/25/20 0422  WBC 10.4 6.4 5.2  RBC 3.26* 2.90* 2.84*  HGB 9.4* 8.2* 7.9*  HCT 30.3* 27.9* 27.1*  MCV 92.9 96.2 95.4  MCH 28.8 28.3 27.8  MCHC 31.0 29.4* 29.2*  RDW 16.8* 16.5* 16.4*  PLT 219 141* 131*    BNP Recent Labs  Lab 12/21/20 0953  BNP 453.0*     DDimer No results for input(s): DDIMER in the last 168 hours.   Radiology    No results found.  Cardiac  Studies   Echocardiogram: 11/22/2020 IMPRESSIONS     1. Left ventricular ejection fraction, by estimation, is 55 to 60%. The  left ventricle has normal function. There is moderate left ventricular  hypertrophy. Left ventricular diastolic parameters are indeterminate.   2. Right ventricular systolic function is normal. The right ventricular  size is normal. There is severely elevated pulmonary artery systolic  pressure.   3. Left atrial size was severely dilated.   4. Right atrial size was severely dilated.   5. Mild mitral valve regurgitation. No evidence of mitral stenosis.  Moderate mitral annular calcification.   6. The tricuspid valve is abnormal. Tricuspid valve regurgitation is  moderate.   7. The aortic valve is tricuspid. There is mild calcification of the  aortic valve. There is mild thickening of the aortic valve. Aortic valve  regurgitation is not visualized. No aortic stenosis is present.   8. Severe pulmonary HTN, PASP is 71 mmHg.   9. The inferior vena cava is dilated in size with <50% respiratory  variability, suggesting right atrial pressure of 15 mmHg.  10. Limited echo   Patient Profile     70 y.o. female w/PMH of HFimpEF (EF 40% in 2016, improved to 55-60% by repeat imaging in 01/2015, 60-65% by echo in 11/2017, reported at 30% in 08/2020 and cath showed normal cors, EF 55-60% in 11/2020), pulmonary HTN, chronic hypoxic respiratory failure (on 2L [place] at baseline), paroxysmal atrial fibrillation, HTN, dementia, and OSA (intolerant to CPAP) who is currently admitted for acute hypoxic respiratory failure. Cardiology consulted for atrial fibrillation with RVR.   Assessment & Plan    1. Atrial Fibrillation with RVR - Was agitated while intubated and HR was in the 120's to 130's at times, now improved since extubation. HR has been well-controlled in the 50's to 60's. Nocturnal pauses overnight but the longest was 2.27 seconds. Remains on PO Amiodarone 280m daily for  rate-control (was on this in the outpatient setting as well). As previously discussed, could switch to IV dosing if she has recurrent tachycardia. Has missed several doses due to NPO status but Amiodarone does have a long half-life. No BB or CCB given soft BP.  - Her CHA2DS2-VASc Score is at least 6. Was on  Eliquis prior to admission and initially receiving full-dose Lovenox. Now held given her anemia.   2. Elevated Troponin Values - Enzymes have been flat this admission, peaking at 113. Recent cath in 08/2020 showed normal cors by review of Care Everywhere. No plans for repeat ischemic eval this admission. Enzyme elevation most consistent with demand ischemia in the setting of acute hypoxic respiratory failure.   3. HFimpEF - Her EF was reported at 30% in 08/2020 and cath showed normal cors, EF improved to 55-60% by echocardiogram in 11/2020.  - BNP was at 453 on admission and she was initially receiving diuresis but now held given hypotension and NPO status. Would hold on diuretics given NPO status. No longer on pressor support. Patient's daughter feels she responded better to PO Torsemide as compared to PO Lasix so would restart Torsemide at d/c.    4. Pulmonary HTN - Likely due to underlying COPD. Not a candidate for aggressive therapies given her co-morbidities and underlying dementia. Recent RHC in 08/2020 showed severe pulmonary HTN with PA (mean): 77/38 (53) mmHg, PCWP mean: 27 mmHg and PA Sat: 63 %.    5. Acute on Chronic Hypoxic and Hypercarbic Respiratory Failure - Initially required BiPAP but extubated on 6/22. Still on HFNC. Palliative Care is following but at this time the patient's family wants full scope of treatment.  - Per Pulmonology and admitting team.    6. Anemia - Hgb was at 9.5 on admission, down to 7.9 today. Management per admitting team. From a cardiac perspective, would recommend transfusion if Hgb < 7 given her atrial fibrillation.   For questions or updates, please  contact [place] Please consult [email or web address] for contact info under        Signed, Erma Heritage, PA-C  12/25/2020, 9:09 AM     Pt seen and examined   I agree with findings as noted above by B  Strader above  The pt is a 69 yo with hx of HFrEF and now hFpEF , atrial fibrillation, COPD and pulm HTN     The pt is now extubated  Currently sedated  ON exam, the pt appears comfortable laying relatively flat in bed BP is labile   Heart rates are now controlled   40s to 60s  (afib)  On exam, Lungs are rel clear   Cardiac exam  Irreg irreg   No S3  Extremities are warm  No edema  Keep on amiodarone po Received one dose of lasix yesterday   Watch I/O  CVP    Continue lovenox for now  Follow  CBC and BMET   Dorris Carnes MD

## 2020-12-25 NOTE — Progress Notes (Signed)
PROGRESS NOTE    [name]  NGE:[id number] DOB: June 30, 1951 DOA: 12/21/2020 PCP: Tara Evens, MD   Subjective:  Seen and examined this morning, hemodynamically stable, still somnolent, blood pressure soft, satting 98% on 9 L of oxygen  Status postextubation 12/23/2020  Discussed with pulmonary, and daughter at bedside, would like to pursue full scope of care if she declines... Agree intubation and tracheostomy Pulmonary stated that so patient is to be transferred to [facility].  Daughter is agreeable.    Brief Narrative:   [name] is a 70 y.o. female with medical history significant for obesity, type 2 diabetes, chronic atrial fibrillation on Eliquis, dementia, diastolic CHF with chronic hypoxemia on 2 L nasal cannula, hypothyroidism, seizure disorder, CKD stage III, CAD with prior NSTEMI, dyslipidemia, and GERD who presented to the ED with worsening shortness of breath over the past couple days.  She was admitted with acute on chronic hypoxemic and hypercarbic respiratory failure in the setting of CHF exacerbation.  BiPAP was initially attempted, but she could not tolerate this and therefore was intubated.  PCCM now following for ventilator management.  She is having hypotension requiring pressor use.  Assessment & Plan:   Active Problems:   Atrial fibrillation with RVR (HCC)   Acute respiratory failure with hypoxia and hypercapnia (HCC)   Acute hypoxemic respiratory failure (HCC)   Elevated troponin   Acute on chronic hypoxemic and hypercarbic respiratory failure -Extubated 12/23/2020 -Currently on 9 L of oxygen, satting 98%  -We do not recommend reintubation due to poor prognosis -Pulmonary team agrees, patient's daughter would like to continue with a full scope of care If reintubation, or tracheostomy needed patient will be transferred to [place] to remain intubated, PCCM has suggested palliative consultation -Has had prior intubations and  tracheostomy -Noted to have pulmonary arterial hypertension on prior CT 11/2020 -COVID testing negative; vaccinated and boosted -IV Lasix twice daily currently held due to hypotension -Monitor in ICU   Acute on chronic diastolic CHF exacerbation -Recent 2D echocardiogram 5/22 with LVEF 55-60% and indeterminate diastolic parameter -Strict I's and O's -Monitor daily weights -Consider restarting gentle IV fluid hydration as patient is not taking anything p.o.  Hypotension likely related to sedation -BP has improved, will discontinue pressors Continue gentle IV fluid hydration --- will be discontinued -off Levophed -Started on norepinephrine and PICC line placed 6/21... Discontinued 12/24/2020   Elevated troponin -Likely secondary to above -Continue to trend and monitor -Cardiology following   Chronic atrial fibrillation- -Tachycardia improved -Continue amiodarone and full dose Lovenox -Cardiology consulted, currently poor p.o. intake, if RVR develops, considering amiodarone IV  Type 2 diabetes -Recent hemoglobin A1c 7.6% -We will encourage p.o. intake, initiating CBG every 4 hours, with SSI coverage   AKI on CKD stage IIIa -Continue to monitor -Status post treatment with IV fluid -Responded to IV fluids,-discontinued   CAD with prior NSTEMI/dyslipidemia -Continue statin -   Anemia of chronic disease -Baseline hemoglobin near 10, transfuse for hemoglobin less than 7 -Current hemoglobin 8.8, >> 9.4, stable  OSA/OHS -Patient intolerant to CPAP   Hypothyroidism -Recent TSH 0.526 -Continue Synthroid per tube   History of seizures -Continue Keppra, now IV   History of dementia -Continue Aricept   Stage I sacral ulcer -Present on admission -Wound care   Obesity -BMI 36.64 -Lifestyle changes outpatient  Rectal bleed -H&H stable, with holding Lovenox at this time Monitoring for further rectal bleed, if her H&H drops, continues to have rectal bleed, will  consider  consulting gastroenterology for evaluation At this time monitoring very closely   DVT prophylaxis: Full dose Lovenox??  On hold due to reported rectal tube for nursing Code Status: Full -discussed with daughter at bedside ... Palliative care also has discussed patient care in detail along with Dr. Melvyn Novas.... She continues to have unrealistic expectations, such as reintubation and tracheostomy It was explained to patient daughter that once patient has tracheostomy will be very difficult to discharge her home likely will end up and LTAC Also was explained that tracheostomy would not help the underlying issue for lung and heart disease.   Family Communication: Daughter, [name] at bedside 6/23 Disposition Plan:  Status is: Inpatient  Remains inpatient appropriate because:Hemodynamically unstable, IV treatments appropriate due to intensity of illness or inability to take PO, and Inpatient level of care appropriate due to severity of illness  Dispo: The patient is from: Home              Anticipated d/c is to: SNF              Patient currently is not medically stable to d/c.   Difficult to place patient No  Skin Assessment:  I have examined the patient's skin and I agree with the wound assessment as performed by the wound care RN as outlined below:  Pressure Injury 11/21/20 Sacrum Medial Stage 1 -  Intact skin with non-blanchable redness of a localized area usually over a bony prominence. Stage 1 pressure Ulcer non blanchable (Active)  11/21/20 2259  Location: Sacrum  Location Orientation: Medial  Staging: Stage 1 -  Intact skin with non-blanchable redness of a localized area usually over a bony prominence.  Wound Description (Comments): Stage 1 pressure Ulcer non blanchable  Present on Admission: Yes    Consultants:  PCCM Palliative  Procedures:  Intubation 6/20 PICC line 6/21 See below  Antimicrobials:  Anti-infectives (From admission, onward)    Start     Dose/Rate Route  Frequency Ordered Stop   12/23/20 1400  ceFAZolin (ANCEF) IVPB 1 g/50 mL premix        1 g 100 mL/hr over 30 Minutes Intravenous Every 8 hours 12/23/20 1306          Objective: Vitals:   12/25/20 0400 12/25/20 0500 12/25/20 0600 12/25/20 0700  BP: (!) 142/91 (!) 137/94 (!) 144/91 102/78  Pulse: (!) 58 (!) 58 (!) 54 65  Resp: (!) 0 (!) 0 (!) 0 19  Temp: (!) 96.3 F (35.7 C)     TempSrc: Axillary     SpO2: 100% 100% 100% 98%  Weight:  105.8 kg    Height:        Intake/Output Summary (Last 24 hours) at 12/25/2020 1057 Last data filed at 12/25/2020 0704 Gross per 24 hour  Intake 2432.51 ml  Output 150 ml  Net 2282.51 ml   Filed Weights   12/21/20 2206 12/23/20 0516 12/25/20 0500  Weight: 103.5 kg 105 kg 105.8 kg       Physical Exam:   General:  Awake but does not open her eyes, agitated in bed, fidgety  HEENT:  Normocephalic, PERRL, otherwise with in Normal limits   Neuro:  CNII-XII intact. , normal motor and sensation, reflexes intact   Lungs:   Clear to auscultation BL, Respirations unlabored, no wheezes / crackles  Cardio:    S1/S2, RRR, No murmure, No Rubs or Gallops   Abdomen:   Soft, non-tender, bowel sounds active all four quadrants,  no guarding  or peritoneal signs.  Muscular skeletal:  Limited exam - in bed, able to move all 4 extremities,  2+ pulses,  symmetric, No pitting edema  Skin:  Dry, warm to touch, negative for any Rashes,  Wounds: Please see nursing documentation  Pressure Injury 11/21/20 Sacrum Medial Stage 1 -  Intact skin with non-blanchable redness of a localized area usually over a bony prominence. Stage 1 pressure Ulcer non blanchable (Active)  11/21/20 2259  Location: Sacrum  Location Orientation: Medial  Staging: Stage 1 -  Intact skin with non-blanchable redness of a localized area usually over a bony prominence.  Wound Description (Comments): Stage 1 pressure Ulcer non blanchable  Present on Admission: Yes             Data  Reviewed: I have personally reviewed following labs and imaging studies  CBC: Recent Labs  Lab 12/21/20 0953 12/22/20 0652 12/23/20 0340 12/24/20 1806 12/25/20 0422  WBC 5.7 7.6 10.4 6.4 5.2  NEUTROABS 3.6  --   --   --   --   HGB 9.5* 8.8* 9.4* 8.2* 7.9*  HCT 32.4* 28.7* 30.3* 27.9* 27.1*  MCV 98.8 92.6 92.9 96.2 95.4  PLT 221 182 219 141* 465*   Basic Metabolic Panel: Recent Labs  Lab 12/21/20 0953 12/22/20 0652 12/23/20 0340 12/25/20 0422  NA 140 142 142 145  K 3.9 3.9 3.1* 3.7  CL 96* 100 102 107  CO2 36* 31 30 33*  GLUCOSE 88 185* 139* 110*  BUN '15 22 22 23  ' CREATININE 0.98 1.24* 1.15* 0.86  CALCIUM 8.4* 8.4* 8.1* 8.1*  MG  --  2.1 1.9  --    GFR: Estimated Creatinine Clearance: 73.8 mL/min (by C-G formula based on SCr of 0.86 mg/dL). Liver Function Tests:  Recent Labs  Lab 12/24/20 1625 12/24/20 1934 12/25/20 0024 12/25/20 0449 12/25/20 0757  GLUCAP 112* 133* 148* 110* 133*   Lipid Profile: Recent Labs    12/25/20 0422  TRIG 50   Thyroid Function Tests: No results for input(s): TSH, T4TOTAL, FREET4, T3FREE, THYROIDAB in the last 72 hours. Anemia Panel: No results for input(s): VITAMINB12, FOLATE, FERRITIN, TIBC, IRON, RETICCTPCT in the last 72 hours. Sepsis Labs: No results for input(s): PROCALCITON, LATICACIDVEN in the last 168 hours.  Recent Results (from the past 240 hour(s))  MRSA Next Gen by PCR, Nasal     Status: None   Collection Time: 12/21/20  9:20 AM   Specimen: Nasal Mucosa; Nasal Swab  Result Value Ref Range Status   MRSA by PCR Next Gen NOT DETECTED NOT DETECTED Final    Comment: (NOTE) The GeneXpert MRSA Assay (FDA approved for NASAL specimens only), is one component of a comprehensive MRSA colonization surveillance program. It is not intended to diagnose MRSA infection nor to guide or monitor treatment for MRSA infections. Test performance is not FDA approved in patients less than 18 years old. Performed at [facility], [address], [place], [place] [address]   Resp Panel by RT-PCR (Flu A&B, Covid) Nasopharyngeal Swab     Status: None   Collection Time: 12/21/20 11:45 AM   Specimen: Nasopharyngeal Swab; Nasopharyngeal(NP) swabs in vial transport medium  Result Value Ref Range Status   SARS Coronavirus 2 by RT PCR NEGATIVE NEGATIVE Final    Comment: (NOTE) SARS-CoV-2 target nucleic acids are NOT DETECTED.  The SARS-CoV-2 RNA is generally detectable in upper respiratory specimens during the acute phase of infection. The lowest concentration of SARS-CoV-2 viral copies this assay  can detect is 138 copies/mL. A negative result does not preclude SARS-Cov-2 infection and should not be used as the sole basis for treatment or other patient management decisions. A negative result may occur with  improper specimen collection/handling, submission of specimen other than nasopharyngeal swab, presence of viral mutation(s) within the areas targeted by this assay, and inadequate number of viral copies(<138 copies/mL). A negative result must be combined with clinical observations, patient history, and epidemiological information. The expected result is Negative.  Fact Sheet for Patients:  EntrepreneurPulse.com.au  Fact Sheet for Healthcare Providers:  IncredibleEmployment.be  This test is no t yet approved or cleared by the [place] FDA and  has been authorized for detection and/or diagnosis of SARS-CoV-2 by FDA under an Emergency Use Authorization (EUA). This EUA will remain  in effect (meaning this test can be used) for the duration of the COVID-19 declaration under Section 564(b)(1) of the Act, 21 U.S.C.section 360bbb-3(b)(1), unless the authorization is terminated  or revoked sooner.       Influenza A by PCR NEGATIVE NEGATIVE Final   Influenza B by PCR NEGATIVE NEGATIVE Final    Comment: (NOTE) The Xpert Xpress SARS-CoV-2/FLU/RSV plus assay is intended as an  aid in the diagnosis of influenza from Nasopharyngeal swab specimens and should not be used as a sole basis for treatment. Nasal washings and aspirates are unacceptable for Xpert Xpress SARS-CoV-2/FLU/RSV testing.  Fact Sheet for Patients: EntrepreneurPulse.com.au  Fact Sheet for Healthcare Providers: IncredibleEmployment.be  This test is not yet approved or cleared by the [place] FDA and has been authorized for detection and/or diagnosis of SARS-CoV-2 by FDA under an Emergency Use Authorization (EUA). This EUA will remain in effect (meaning this test can be used) for the duration of the COVID-19 declaration under Section 564(b)(1) of the Act, 21 U.S.C. section 360bbb-3(b)(1), unless the authorization is terminated or revoked.  Performed at [facility], [address], [place], [place] [address]   Culture, Urine     Status: Abnormal   Collection Time: 12/21/20  4:43 PM   Specimen: Urine, Random  Result Value Ref Range Status   Specimen Description   Final    URINE, RANDOM Performed at [facility], [address] [place], [place] [address]    Special Requests   Final    NONE Performed at [facility], [address], [place], [place] [address]    Culture >=100,000 COLONIES/mL ESCHERICHIA COLI (A)  Final   Report Status 12/24/2020 FINAL  Final   Organism ID, Bacteria ESCHERICHIA COLI (A)  Final      Susceptibility   Escherichia coli - MIC*    AMPICILLIN <=2 SENSITIVE Sensitive     CEFAZOLIN <=4 SENSITIVE Sensitive     CEFEPIME <=0.12 SENSITIVE Sensitive     CEFTRIAXONE <=0.25 SENSITIVE Sensitive     CIPROFLOXACIN <=0.25 SENSITIVE Sensitive     GENTAMICIN <=1 SENSITIVE Sensitive     IMIPENEM <=0.25 SENSITIVE Sensitive     NITROFURANTOIN <=16 SENSITIVE Sensitive     TRIMETH/SULFA <=20 SENSITIVE Sensitive     AMPICILLIN/SULBACTAM <=2 SENSITIVE Sensitive     PIP/TAZO <=4 SENSITIVE Sensitive     * >=100,000 COLONIES/mL  ESCHERICHIA COLI         Radiology Studies: No results found.      Scheduled Meds:  amiodarone  200 mg Per Tube q morning   atorvastatin  80 mg Per Tube Daily   chlorhexidine gluconate (MEDLINE KIT)  15 mL Mouth Rinse BID   Chlorhexidine  Gluconate Cloth  6 each Topical Daily   cholecalciferol  2,000 Units Per Tube Daily   docusate  100 mg Per Tube BID   donepezil  5 mg Per Tube Daily   DULoxetine  30 mg Oral BID   feeding supplement (PROSource TF)  45 mL Per Tube QID   guaiFENesin-dextromethorphan  10 mL Oral Q8H   insulin aspart  0-15 Units Subcutaneous Q4H   ipratropium  0.5 mg Nebulization TID   levalbuterol  0.63 mg Nebulization TID   levothyroxine  175 mcg Per Tube Daily   mouth rinse  15 mL Mouth Rinse BID   multivitamin  15 mL Per Tube Daily   pantoprazole (PROTONIX) IV  40 mg Intravenous Q12H   polyethylene glycol  17 g Per Tube Daily   potassium chloride  20 mEq Per Tube Daily   sodium chloride flush  10-40 mL Intracatheter Q12H   sodium chloride flush  3 mL Intravenous Q12H   Continuous Infusions:  sodium chloride Stopped (12/24/20 0854)    ceFAZolin (ANCEF) IV Stopped (12/25/20 7654)   dexmedetomidine (PRECEDEX) IV infusion 1.165 mcg/kg/hr (12/25/20 1014)   dextrose 5 % and 0.45% NaCl 50 mL/hr at 12/25/20 1019   feeding supplement (OSMOLITE 1.5 CAL)     lactated ringers 75 mL/hr at 12/25/20 0704   levETIRAcetam Stopped (12/24/20 2356)     LOS: 4 days    Time spent: 55 minutes of critical time was spent seeing evaluating patient, discussing care with consultants, palliative care, family meeting, planning current medical care... Adjusting medications,    Deatra James, MD [facility]  If 7PM-7AM, please contact night-coverage [email or web address] 12/25/2020, 10:57 AM

## 2020-12-25 NOTE — Progress Notes (Addendum)
Brief Nutrition Note  SLP evaluated pt and reported to RD that she was able to consume honey-thickened liquids with very little issue. SLP recommends D1/honey thickened liquids.  If PO intake is inadequate, <50% for meals, RD still will recommend placing NGT and starting tube feeding. See full note for TF regimen.  RD will continue to monitor.  Vertell Limber, RD, LDN (she/her/hers) Registered Dietitian I After-Hours/Weekend Pager # in [place]

## 2020-12-25 NOTE — Progress Notes (Signed)
NAME:  [name], MRN:  [id number], DOB:  1951-04-13, LOS: 4 ADMISSION DATE:  12/21/2020, CONSULTATION DATE:  6/20 REFERRING MD:  Manuella Ghazi, [facility], CHIEF COMPLAINT:  sob   History of Present Illness:  44  yowf quit smoking 1984 (s airflow obst a recently as 01/2016 pft) with medical history significant for obesity, type 2 diabetes, chronic atrial fibrillation on Eliquis, dementia, diastolic CHF with chronic hypoxemia on 2 L nasal cannula, hypothyroidism, seizure disorder, CKD stage III, CAD with prior NSTEMI, dyslipidemia, and GERD who presented to the ED with worsening shortness of breath over the past couple days.  Daughter at bedside states that she has had approximately 20 pound weight gain noted as well over the same time frame.  Her symptoms were worsening and EMS was called and upon arrival she was noted to have pulse oximetry of 55%.  She was noted to be taking her medications normally according to the daughter and was wearing her home oxygen.  She denied any chest pain, cough, fevers, or chills.  Some mild lower extremity edema has been noted as well.  No other sick contacts and she has had her COVID vaccines and booster shots.  She appears to have been admitted just last month with acute on chronic diastolic CHF.  Her discharge weight at that time was 214 pounds.  She is currently at 227 pounds.   ED Course: Vital signs with some mild tachycardia noted with atrial fibrillation on EKG and ABG with PCO2 68.2 and PO2 of 71.5.  Troponin of 113.  BNP 453.  Chest x-ray with some cardiomegaly and pulmonary vascular congestion noted.  COVID testing pending.  She has been started on some Solu-Medrol as well as Lasix twice daily.  She is currently on BiPAP 40% FiO2.   Baseline per daughter = mostly housbound on 2lpm 24/7 with baseline dementia but AMS worse than usual x several weeks assoc with wt gain, no cough or fever or cp / most the swelling is in her abd not legs    Pertinent  Medical History      Significant Hospital Events: Including procedures, antibiotic start and stop dates in addition to other pertinent events   MRSA  PCR 6/20 neg Resp viral panel PCR  6/20 neg Urine culture  6/20  Ecoli > 100k pan sensitive ET 6/20  > 6/22  PIC  RUE 6/21 >>> ESR 6/22 >>> Ancef 6/22 >>>   Scheduled Meds:  amiodarone  200 mg Per Tube q morning   atorvastatin  80 mg Per Tube Daily   chlorhexidine gluconate (MEDLINE KIT)  15 mL Mouth Rinse BID   Chlorhexidine Gluconate Cloth  6 each Topical Daily   cholecalciferol  2,000 Units Per Tube Daily   docusate  100 mg Per Tube BID   donepezil  5 mg Per Tube Daily   DULoxetine  30 mg Oral BID   feeding supplement (PROSource TF)  45 mL Per Tube QID   guaiFENesin-dextromethorphan  10 mL Oral Q8H   insulin aspart  0-15 Units Subcutaneous Q4H   ipratropium  0.5 mg Nebulization BID   levalbuterol  0.63 mg Nebulization BID   levothyroxine  175 mcg Per Tube Daily   mouth rinse  15 mL Mouth Rinse BID   multivitamin  15 mL Per Tube Daily   pantoprazole (PROTONIX) IV  40 mg Intravenous Q12H   polyethylene glycol  17 g Per Tube Daily   potassium chloride  20 mEq Per Tube Daily  sodium chloride flush  10-40 mL Intracatheter Q12H   sodium chloride flush  3 mL Intravenous Q12H   Continuous Infusions:  sodium chloride Stopped (12/24/20 0854)    ceFAZolin (ANCEF) IV Stopped (12/25/20 2863)   dexmedetomidine (PRECEDEX) IV infusion 1.2 mcg/kg/hr (12/25/20 1337)   dextrose 5 % and 0.45% NaCl 50 mL/hr at 12/25/20 1019   feeding supplement (OSMOLITE 1.5 CAL)     lactated ringers 75 mL/hr at 12/25/20 0704   levETIRAcetam Stopped (12/24/20 2356)   PRN Meds:.sodium chloride, acetaminophen **OR** acetaminophen, albuterol, haloperidol lactate, loperamide, ondansetron **OR** ondansetron (ZOFRAN) IV, sodium chloride flush, sodium chloride flush    Interim History / Subjective:   Still off pressors but on high doses of precedex and 02 sats are well above  target of 88-94%   Objective   Blood pressure 102/78, pulse 65, temperature (!) 97.2 F (36.2 C), temperature source Axillary, resp. rate 19, height '5\' 6"'  (1.676 m), weight 105.8 kg, SpO2 98 %. CVP:  [13 mmHg-30 mmHg] 30 mmHg  FiO2 (%):  [98 %] 98 %   Intake/Output Summary (Last 24 hours) at 12/25/2020 1340 Last data filed at 12/25/2020 0704 Gross per 24 hour  Intake 2432.51 ml  Output --  Net 2432.51 ml   Filed Weights   12/21/20 2206 12/23/20 0516 12/25/20 0500  Weight: 103.5 kg 105 kg 105.8 kg    Examination:  Tmax  97.4 General appearance:    Elderly wf  "o" sign dry mucosa   At Rest 02 sats  99% on 9lpm No jvd Oropharynx clear,  mucosa nl Neck supple Lungs with a few scattered exp > insp rhonchi bilaterally RRR no s3 or or sign murmur Abd obese with minimal excursion  Extr warm with no edema or clubbing noted Neuro  Sensorium sedated, no verbal response ,  no apparent motor deficits    I personally reviewed images and agree with radiology impression as follows:  CXR:   portable 6/24 1. Limited evaluation due to portable technique with patient rotation. 2. Cardiomegaly and central pulmonary vascular congestion. 3. Left greater than right bibasilar opacities, which could represent atelectasis, aspiration, and/or pneumonia  Assessment & Plan:  Acute on chronic hypercarbic and hypoxemic resp failure with biventricular chf but only mild pulmonary edema on cxr suggesting restrictive changes  (documented by pfts 12/05/17) or chf causing decompensation (she has massive CM, kyphosis, and obesity contributing to restriction and none of these components are reversible in the foreseeable future) >>>  very limited options here given pt is not a good candidate at all for NIV and would likely need to be sedated to tolerate any kind of mask >>>  > full code for now  with reasoning that she survived vent/trach previously  >>> goal is sats low 90s so as not to interfere with resp drive    AMS c/w TME likely related to acute on chronic hypercabia/ C02 narcosis  >>> continue precedex/ haldol prn post extubation with goal of keeping her from climbing out of bed and pulling on lines but we need her more alert daytime if possible with goal of getting OOB    Underlying dementia very challenging in terms of addressing her long term outlook and for hypercabia as I strongly doubt she would be compliant with NIV  Relative hypovolemia presently making her bp borderline in setting of cor pulmonale (WHO III) with Left heart failure (WHO II) PHJ - cvp trending  up but not sure that's accurate/ note renal function better though  hard to gauge UOP s foley      CAF with rapid ventricular response   - slowed ventricular response off et and on precedex which need to continue to taper off    URI/ doubt sepsis >  continue  Ancef per flowsheet above   Labs   CBC: Recent Labs  Lab 12/21/20 0953 12/22/20 0652 12/23/20 0340 12/24/20 1806 12/25/20 0422  WBC 5.7 7.6 10.4 6.4 5.2  NEUTROABS 3.6  --   --   --   --   HGB 9.5* 8.8* 9.4* 8.2* 7.9*  HCT 32.4* 28.7* 30.3* 27.9* 27.1*  MCV 98.8 92.6 92.9 96.2 95.4  PLT 221 182 219 141* 131*    Basic Metabolic Panel: Recent Labs  Lab 12/21/20 0953 12/22/20 0652 12/23/20 0340 12/25/20 0422  NA 140 142 142 145  K 3.9 3.9 3.1* 3.7  CL 96* 100 102 107  CO2 36* 31 30 33*  GLUCOSE 88 185* 139* 110*  BUN '15 22 22 23  ' CREATININE 0.98 1.24* 1.15* 0.86  CALCIUM 8.4* 8.4* 8.1* 8.1*  MG  --  2.1 1.9 2.0  PHOS  --   --   --  3.8   GFR: Estimated Creatinine Clearance: 73.8 mL/min (by C-G formula based on SCr of 0.86 mg/dL). Recent Labs  Lab 12/22/20 0652 12/23/20 0340 12/24/20 1806 12/25/20 0422  WBC 7.6 10.4 6.4 5.2    Liver Function Tests: Recent Labs  Lab 12/21/20 0953 12/25/20 0422  AST 17 26  ALT 13 16  ALKPHOS 56 43  BILITOT 0.4 0.7  PROT 6.2* 4.9*  ALBUMIN 3.2* 2.6*   No results for input(s): LIPASE, AMYLASE in the  last 168 hours. No results for input(s): AMMONIA in the last 168 hours.  ABG    Component Value Date/Time   PHART 7.386 12/21/2020 1539   PCO2ART 55.0 (H) 12/21/2020 1539   PO2ART 235 (H) 12/21/2020 1539   HCO3 30.5 (H) 12/21/2020 1539   TCO2 27 09/06/2020 1238   ACIDBASEDEF 1.0 01/26/2015 1132   O2SAT 99.4 12/21/2020 1539     Coagulation Profile: No results for input(s): INR, PROTIME in the last 168 hours.  Cardiac Enzymes: No results for input(s): CKTOTAL, CKMB, CKMBINDEX, TROPONINI in the last 168 hours.  HbA1C: Hgb A1c MFr Bld  Date/Time Value Ref Range Status  11/22/2020 04:03 AM 7.6 (H) 4.8 - 5.6 % Final    Comment:    (NOTE) Pre diabetes:          5.7%-6.4%  Diabetes:              >6.4%  Glycemic control for   <7.0% adults with diabetes   08/24/2020 10:02 AM 7.9 (H) 4.8 - 5.6 % Final    Comment:    (NOTE) Pre diabetes:          5.7%-6.4%  Diabetes:              >6.4%  Glycemic control for   <7.0% adults with diabetes     CBG: Recent Labs  Lab 12/24/20 1934 12/25/20 0024 12/25/20 0449 12/25/20 0757 12/25/20 1244  GLUCAP 133* 148* 110* 133* 158*      Christinia Gully, MD [facility] [phone]   After 7:00 pm call Elink  [phone]

## 2020-12-26 LAB — GLUCOSE, CAPILLARY
Glucose-Capillary: 191 mg/dL — ABNORMAL HIGH (ref 70–99)
Glucose-Capillary: 213 mg/dL — ABNORMAL HIGH (ref 70–99)
Glucose-Capillary: 204 mg/dL — ABNORMAL HIGH (ref 70–99)
Glucose-Capillary: 179 mg/dL — ABNORMAL HIGH (ref 70–99)
Glucose-Capillary: 211 mg/dL — ABNORMAL HIGH (ref 70–99)
Glucose-Capillary: 222 mg/dL — ABNORMAL HIGH (ref 70–99)

## 2020-12-26 LAB — CBC
HCT: 26.8 % — ABNORMAL LOW (ref 36.0–46.0)
Hemoglobin: 8.1 g/dL — ABNORMAL LOW (ref 12.0–15.0)
MCH: 28.4 pg (ref 26.0–34.0)
MCHC: 30.2 g/dL (ref 30.0–36.0)
MCV: 94 fL (ref 80.0–100.0)
Platelets: 156 10*3/uL (ref 150–400)
RBC: 2.85 MIL/uL — ABNORMAL LOW (ref 3.87–5.11)
RDW: 16.5 % — ABNORMAL HIGH (ref 11.5–15.5)
WBC: 5.6 10*3/uL (ref 4.0–10.5)
nRBC: 0 % (ref 0.0–0.2)

## 2020-12-26 LAB — COMPREHENSIVE METABOLIC PANEL
ALT: 15 U/L (ref 0–44)
AST: 24 U/L (ref 15–41)
Albumin: 2.7 g/dL — ABNORMAL LOW (ref 3.5–5.0)
Alkaline Phosphatase: 48 U/L (ref 38–126)
Anion gap: 11 (ref 5–15)
BUN: 23 mg/dL (ref 8–23)
CO2: 26 mmol/L (ref 22–32)
Calcium: 8.3 mg/dL — ABNORMAL LOW (ref 8.9–10.3)
Chloride: 107 mmol/L (ref 98–111)
Creatinine, Ser: 0.94 mg/dL (ref 0.44–1.00)
GFR, Estimated: >60 mL/min (ref >60)
Glucose, Bld: 214 mg/dL — ABNORMAL HIGH (ref 70–99)
Potassium: 3.4 mmol/L — ABNORMAL LOW (ref 3.5–5.1)
Sodium: 144 mmol/L (ref 135–145)
Total Bilirubin: 0.9 mg/dL (ref 0.3–1.2)
Total Protein: 5.2 g/dL — ABNORMAL LOW (ref 6.5–8.1)

## 2020-12-26 LAB — PHOSPHORUS
Phosphorus: 3.3 mg/dL (ref 2.5–4.6)
Phosphorus: 2.7 mg/dL (ref 2.5–4.6)

## 2020-12-26 LAB — MAGNESIUM
Magnesium: 2.1 mg/dL (ref 1.7–2.4)
Magnesium: 2.2 mg/dL (ref 1.7–2.4)

## 2020-12-26 MED ORDER — AMIODARONE HCL 200 MG PO TABS
200.0000 mg | ORAL_TABLET | Freq: Every morning | ORAL | Status: DC
  Administered 2020-12-27 – 2020-12-30 (×4): 200 mg via ORAL
  Filled 2020-12-26 (×4): qty 1

## 2020-12-26 MED ORDER — DOCUSATE SODIUM 50 MG/5ML PO LIQD
100.0000 mg | Freq: Two times a day (BID) | ORAL | Status: DC
  Administered 2020-12-26 – 2020-12-28 (×4): 100 mg via ORAL
  Filled 2020-12-26 (×13): qty 10

## 2020-12-26 MED ORDER — DONEPEZIL HCL 5 MG PO TABS
5.0000 mg | ORAL_TABLET | Freq: Every day | ORAL | Status: DC
  Administered 2020-12-27 – 2020-12-30 (×4): 5 mg via ORAL
  Filled 2020-12-26 (×4): qty 1

## 2020-12-26 MED ORDER — POTASSIUM CHLORIDE 20 MEQ PO PACK
20.0000 meq | PACK | Freq: Every day | ORAL | Status: DC
  Administered 2020-12-27 – 2020-12-28 (×2): 20 meq via ORAL
  Filled 2020-12-26 (×3): qty 1

## 2020-12-26 MED ORDER — ADULT MULTIVITAMIN LIQUID CH
15.0000 mL | Freq: Every day | ORAL | Status: DC
  Administered 2020-12-27 – 2020-12-30 (×4): 15 mL via ORAL
  Filled 2020-12-26 (×7): qty 15

## 2020-12-26 MED ORDER — ATORVASTATIN CALCIUM 40 MG PO TABS
80.0000 mg | ORAL_TABLET | Freq: Every day | ORAL | Status: DC
  Administered 2020-12-27 – 2020-12-30 (×4): 80 mg via ORAL
  Filled 2020-12-26 (×4): qty 2

## 2020-12-26 MED ORDER — VITAMIN D 25 MCG (1000 UNIT) PO TABS
2000.0000 [IU] | ORAL_TABLET | Freq: Every day | ORAL | Status: DC
  Administered 2020-12-27 – 2020-12-30 (×4): 2000 [IU] via ORAL
  Filled 2020-12-26 (×4): qty 2

## 2020-12-26 MED ORDER — LEVOTHYROXINE SODIUM 75 MCG PO TABS
175.0000 ug | ORAL_TABLET | Freq: Every day | ORAL | Status: DC
  Administered 2020-12-27 – 2020-12-30 (×4): 175 ug via ORAL
  Filled 2020-12-26 (×4): qty 1

## 2020-12-26 NOTE — Progress Notes (Signed)
Pharmacy Antibiotic Note  [name] is a 70 y.o. female admitted on 12/21/2020 with UTI.  Pharmacy has been consulted for cefazolin dosing.  Plan: Continue Cefazolin 1000 mg IV every 8 hours. Monitor labs, c/s, and patient improvement. F/U transition to po abx  Height: 5\' 6"  (167.6 cm) Weight: 109.3 kg (240 lb 15.4 oz) IBW/kg (Calculated) : 59.3  Temp (24hrs), Avg:97.6 F (36.4 C), Min:97 F (36.1 C), Max:98.4 F (36.9 C)  Recent Labs  Lab 12/21/20 0953 12/22/20 0652 12/23/20 0340 12/24/20 1806 12/25/20 0422 12/26/20 0441  WBC 5.7 7.6 10.4 6.4 5.2 5.6  CREATININE 0.98 1.24* 1.15*  --  0.86 0.94     Estimated Creatinine Clearance: 68.7 mL/min (by C-G formula based on SCr of 0.94 mg/dL).    Allergies  Allergen Reactions   Citalopram Hydrobromide Other (See Comments)    Dyskinesia Other reaction(s): Other (See Comments) Dyskinesia   Codeine Nausea And Vomiting and Other (See Comments)    HALLUCINATIONS Other reaction(s): Unknown Other reaction(s): Other (See Comments) HALLUCINATIONS    Hydromorphone Hcl Other (See Comments)    Made her pass out Other reaction(s): Other (See Comments) Made her pass out   Metoclopramide Other (See Comments)    DYSKINESIA Other reaction(s): Other (See Comments) DYSKINESIA   Hyoscyamine     MADE DIARRHEA WORSE    Antimicrobials this admission: Cefazolin 6/22 >>     Microbiology results: 6/20 UCx: > 100k e coli pan sensitive   Thank you for allowing pharmacy to be a part of this patient's care.  7/20, BS Pharm D, [facility] Clinical Pharmacist Pager [phone]  12/26/2020 1:29 PM

## 2020-12-26 NOTE — Progress Notes (Signed)
PROGRESS NOTE    [name]  LPF:[id number] DOB: 10/31/1950 DOA: 12/21/2020 PCP: Lemmie Evens, MD   Subjective: The patient was seen and examined this morning, still somnolent, but hemodynamically stable, Actually satting 100% ... O2 demand has improved from 9 L to 4 L of oxygen  Status postextubation 12/23/2020  Discussed with pulmonary yesterday, and with daughter at bedside againtoday, would like to pursue full scope of care if she declines... Agree to re- intubation and tracheostomy at [facility]   De-escalating Precedex  Brief Narrative:   [name] is a 70 y.o. female with medical history significant for obesity, type 2 diabetes, chronic atrial fibrillation on Eliquis, dementia, diastolic CHF with chronic hypoxemia on 2 L nasal cannula, hypothyroidism, seizure disorder, CKD stage III, CAD with prior NSTEMI, dyslipidemia, and GERD who presented to the ED with worsening shortness of breath over the past couple days.  She was admitted with acute on chronic hypoxemic and hypercarbic respiratory failure in the setting of CHF exacerbation.  BiPAP was initially attempted, but she could not tolerate this and therefore was intubated.  PCCM now following for ventilator management.  She is having hypotension requiring pressor use.  Assessment & Plan:   Active Problems:   Atrial fibrillation with RVR (HCC)   Acute respiratory failure with hypoxia and hypercapnia (HCC)   Acute hypoxemic respiratory failure (HCC)   Elevated troponin   Acute on chronic hypoxemic and hypercarbic respiratory failure -Extubated 12/23/2020 -O2 demand has improved from 9 to 4 L of oxygen this morning, satting 100%.  -We do not recommend reintubation due to poor prognosis -Pulmonary team agrees, patient's daughter would like to continue with a full scope of care Including reintubation, and possible tracheostomy if needed For that she needs to be transferred to [place] following -(h/o prior  intubations and tracheostomy) -Noted to have pulmonary arterial hypertension on prior CT 11/2020 -COVID testing negative; vaccinated and boosted -IV Lasix twice daily currently held due to hypotension -Monitor in ICU   Toxic metabolic encephalopathy  -Post extubation started having agitation, with confusion -Daughter mental status continue to improve, somnolent this a.m. -Still having some agitation, anticipating de-escalating Precedex, as needed Haldol     acute on chronic diastolic CHF exacerbation -Remained stable, limiting IV fluid -Recent 2D echocardiogram 5/22 with LVEF 55-60% and indeterminate diastolic parameter -Strict I's and O's -Monitor daily weights   Hypotension likely related to sedation -Monitor blood pressure closely, currently stable -Pending IV fluids, -off Levophed -(was started on norepinephrine and PICC line placed 6/21... Discontinued 12/24/2020)   Elevated troponin -Likely due to above, ischemic demand, -Denies any chest pain, A. fib with no acute changes  Chronic atrial fibrillation- -Tachycardia improved -Continuing amiodarone, holding Lovenox and chronic anticoagulation medication due to episodes of rectal bleed -Cardiology consulted, currently poor p.o. intake, if RVR develops, considering amiodarone IV -If H&H remained stable, anticipating restarting Eliquis  Type 2 diabetes -Recent hemoglobin A1c 7.6% -We will encourage p.o. intake, initiating CBG every 4 hours, with SSI coverage   AKI on CKD stage IIIa -Continue to monitor -Status post treatment with IV fluid -Responded to IV fluids,-discontinued   CAD with prior NSTEMI/dyslipidemia -Continue statin    Anemia of chronic disease -Baseline hemoglobin near 10, transfuse for hemoglobin less than 7 -Current hemoglobin 8.8, >> 9.4, stable  OSA/OHS -Continue supplemental oxygen, CPAP if she tolerates   Hypothyroidism -Recent TSH 0.526 -Continue Synthroid per tube   History of  seizures -Continue Keppra, now IV  History of dementia -Continue Aricept   Stage I sacral ulcer -Present on admission -Wound care   Obesity -BMI 36.64 -Lifestyle changes outpatient  Rectal bleed -H&H stable, with holding Lovenox at this time Monitoring for further rectal bleed, if her H&H drops, continues to have rectal bleed, will consider consulting gastroenterology for evaluation At this time monitoring very closely   DVT prophylaxis: Full dose Lovenox??  On hold due to reported rectal tube for nursing  Code Status: Full -discussed with daughter at bedside ... Palliative care also has discussed patient care in detail along with Dr. Melvyn Novas.... Her daughter continues to have unrealistic expectations, such as reintubation and tracheostomy It was explained to patient daughter that once patient has tracheostomy will be very difficult to discharge her home likely will end up and LTAC Also was explained that tracheostomy would not help the underlying issue for lung and heart disease.   Family Communication: Daughter, [name] at bedside 6/25 Disposition Plan:  Status is: Inpatient  Remains inpatient appropriate because:Hemodynamically unstable, IV treatments appropriate due to intensity of illness or inability to take PO, and Inpatient level of care appropriate due to severity of illness  Dispo: The patient is from: Home              Anticipated d/c is to: SNF ???  Versus Home with home health              Patient currently is not medically stable to d/c.   Difficult to place patient No  Skin Assessment:  I have examined the patient's skin and I agree with the wound assessment as performed by the wound care RN as outlined below:  Pressure Injury 11/21/20 Sacrum Medial Stage 1 -  Intact skin with non-blanchable redness of a localized area usually over a bony prominence. Stage 1 pressure Ulcer non blanchable (Active)  11/21/20 2259  Location: Sacrum  Location Orientation: Medial   Staging: Stage 1 -  Intact skin with non-blanchable redness of a localized area usually over a bony prominence.  Wound Description (Comments): Stage 1 pressure Ulcer non blanchable  Present on Admission: Yes    Consultants:  PCCM Palliative  Procedures:  Intubation 6/20 PICC line 6/21 See below  Antimicrobials:  Anti-infectives (From admission, onward)    Start     Dose/Rate Route Frequency Ordered Stop   12/23/20 1400  ceFAZolin (ANCEF) IVPB 1 g/50 mL premix        1 g 100 mL/hr over 30 Minutes Intravenous Every 8 hours 12/23/20 1306          Objective: Vitals:   12/26/20 0600 12/26/20 0700 12/26/20 0800 12/26/20 0900  BP: 103/83 110/73    Pulse: 64 (!) 58 65   Resp: (!) 0 11 16   Temp:    97.8 F (36.6 C)  TempSrc:    Axillary  SpO2: 100% 99% 100%   Weight:      Height:        Intake/Output Summary (Last 24 hours) at 12/26/2020 1231 Last data filed at 12/26/2020 0802 Gross per 24 hour  Intake 1889.55 ml  Output --  Net 1889.55 ml   Filed Weights   12/23/20 0516 12/25/20 0500 12/26/20 0441  Weight: 105 kg 105.8 kg 109.3 kg        Physical Exam:   General:  Still very somnolent,  HEENT:  Normocephalic, PERRL, otherwise with in Normal limits   Neuro:  CNII-XII intact. , normal motor and sensation, reflexes intact   Lungs:  Clear to auscultation BL, Respirations unlabored, no wheezes / crackles  Cardio:    S1/S2, RRR, No murmure, No Rubs or Gallops   Abdomen:   Soft, non-tender, bowel sounds active all four quadrants,  no guarding or peritoneal signs.  Muscular skeletal:  Limited exam - in bed, able to move all 4 extremities, Normal strength,  2+ pulses,  symmetric, No pitting edema  Skin:  Dry, warm to touch, negative for any Rashes,  Wounds: Please see nursing documentation  Pressure Injury 11/21/20 Sacrum Medial Stage 1 -  Intact skin with non-blanchable redness of a localized area usually over a bony prominence. Stage 1 pressure Ulcer non  blanchable (Active)  11/21/20 2259  Location: Sacrum  Location Orientation: Medial  Staging: Stage 1 -  Intact skin with non-blanchable redness of a localized area usually over a bony prominence.  Wound Description (Comments): Stage 1 pressure Ulcer non blanchable  Present on Admission: Yes       Data Reviewed: I have personally reviewed following labs and imaging studies  CBC: Recent Labs  Lab 12/21/20 0953 12/22/20 0652 12/23/20 0340 12/24/20 1806 12/25/20 0422 12/26/20 0441  WBC 5.7 7.6 10.4 6.4 5.2 5.6  NEUTROABS 3.6  --   --   --   --   --   HGB 9.5* 8.8* 9.4* 8.2* 7.9* 8.1*  HCT 32.4* 28.7* 30.3* 27.9* 27.1* 26.8*  MCV 98.8 92.6 92.9 96.2 95.4 94.0  PLT 221 182 219 141* 131* 124   Basic Metabolic Panel: Recent Labs  Lab 12/21/20 0953 12/22/20 0652 12/23/20 0340 12/25/20 0422 12/25/20 1700 12/26/20 0441  NA 140 142 142 145  --  144  K 3.9 3.9 3.1* 3.7  --  3.4*  CL 96* 100 102 107  --  107  CO2 36* 31 30 33*  --  26  GLUCOSE 88 185* 139* 110*  --  214*  BUN _0 --  23  CREATININE 0.98 1.24* 1.15* 0.86  --  0.94  CALCIUM 8.4* 8.4* 8.1* 8.1*  --  8.3*  MG  --  2.1 1.9 2.0 2.0 2.1  PHOS  --   --   --  3.8 3.7 3.3   GFR: Estimated Creatinine Clearance: 68.7 mL/min (by C-G formula based on SCr of 0.94 mg/dL). Liver Function Tests:  Recent Labs  Lab 12/25/20 2009 12/25/20 2333 12/26/20 0420 12/26/20 0807 12/26/20 1134  GLUCAP 198* 199* 191* 213* 204*   Lipid Profile: Recent Labs    12/25/20 0422  TRIG 50   Thyroid Function Tests: No results for input(s): TSH, T4TOTAL, FREET4, T3FREE, THYROIDAB in the last 72 hours. Anemia Panel: No results for input(s): VITAMINB12, FOLATE, FERRITIN, TIBC, IRON, RETICCTPCT in the last 72 hours. Sepsis Labs: No results for input(s): PROCALCITON, LATICACIDVEN in the last 168 hours.  Recent Results (from the past 240 hour(s))  MRSA Next Gen by PCR, Nasal     Status: None   Collection Time: 12/21/20   9:20 AM   Specimen: Nasal Mucosa; Nasal Swab  Result Value Ref Range Status   MRSA by PCR Next Gen NOT DETECTED NOT DETECTED Final    Comment: (NOTE) The GeneXpert MRSA Assay (FDA approved for NASAL specimens only), is one component of a comprehensive MRSA colonization surveillance program. It is not intended to diagnose MRSA infection nor to guide or monitor treatment for MRSA infections. Test performance is not FDA approved in patients less than 22 years old. Performed at [facility], [phone]  [address] [place], [place] [address]   Resp Panel by RT-PCR (Flu A&B, Covid) Nasopharyngeal Swab     Status: None   Collection Time: 12/21/20 11:45 AM   Specimen: Nasopharyngeal Swab; Nasopharyngeal(NP) swabs in vial transport medium  Result Value Ref Range Status   SARS Coronavirus 2 by RT PCR NEGATIVE NEGATIVE Final    Comment: (NOTE) SARS-CoV-2 target nucleic acids are NOT DETECTED.  The SARS-CoV-2 RNA is generally detectable in upper respiratory specimens during the acute phase of infection. The lowest concentration of SARS-CoV-2 viral copies this assay can detect is 138 copies/mL. A negative result does not preclude SARS-Cov-2 infection and should not be used as the sole basis for treatment or other patient management decisions. A negative result may occur with  improper specimen collection/handling, submission of specimen other than nasopharyngeal swab, presence of viral mutation(s) within the areas targeted by this assay, and inadequate number of viral copies(<138 copies/mL). A negative result must be combined with clinical observations, patient history, and epidemiological information. The expected result is Negative.  Fact Sheet for Patients:  EntrepreneurPulse.com.au  Fact Sheet for Healthcare Providers:  IncredibleEmployment.be  This test is no t yet approved or cleared by the [place] FDA and  has been authorized for detection and/or  diagnosis of SARS-CoV-2 by FDA under an Emergency Use Authorization (EUA). This EUA will remain  in effect (meaning this test can be used) for the duration of the COVID-19 declaration under Section 564(b)(1) of the Act, 21 U.S.C.section 360bbb-3(b)(1), unless the authorization is terminated  or revoked sooner.       Influenza A by PCR NEGATIVE NEGATIVE Final   Influenza B by PCR NEGATIVE NEGATIVE Final    Comment: (NOTE) The Xpert Xpress SARS-CoV-2/FLU/RSV plus assay is intended as an aid in the diagnosis of influenza from Nasopharyngeal swab specimens and should not be used as a sole basis for treatment. Nasal washings and aspirates are unacceptable for Xpert Xpress SARS-CoV-2/FLU/RSV testing.  Fact Sheet for Patients: EntrepreneurPulse.com.au  Fact Sheet for Healthcare Providers: IncredibleEmployment.be  This test is not yet approved or cleared by the [place] FDA and has been authorized for detection and/or diagnosis of SARS-CoV-2 by FDA under an Emergency Use Authorization (EUA). This EUA will remain in effect (meaning this test can be used) for the duration of the COVID-19 declaration under Section 564(b)(1) of the Act, 21 U.S.C. section 360bbb-3(b)(1), unless the authorization is terminated or revoked.  Performed at [facility], [address], [place], [place] [address]   Culture, Urine     Status: Abnormal   Collection Time: 12/21/20  4:43 PM   Specimen: Urine, Random  Result Value Ref Range Status   Specimen Description   Final    URINE, RANDOM Performed at [facility], [address], [place], [place] [address]    Special Requests   Final    NONE Performed at [facility], [address] [place], [place] [address]    Culture >=100,000 COLONIES/mL ESCHERICHIA COLI (A)  Final   Report Status 12/24/2020 FINAL  Final   Organism ID, Bacteria ESCHERICHIA COLI (A)  Final      Susceptibility   Escherichia coli - MIC*     AMPICILLIN <=2 SENSITIVE Sensitive     CEFAZOLIN <=4 SENSITIVE Sensitive     CEFEPIME <=0.12 SENSITIVE Sensitive     CEFTRIAXONE <=0.25 SENSITIVE Sensitive     CIPROFLOXACIN <=0.25 SENSITIVE Sensitive     GENTAMICIN <=1 SENSITIVE Sensitive     IMIPENEM <=0.25 SENSITIVE  Sensitive     NITROFURANTOIN <=16 SENSITIVE Sensitive     TRIMETH/SULFA <=20 SENSITIVE Sensitive     AMPICILLIN/SULBACTAM <=2 SENSITIVE Sensitive     PIP/TAZO <=4 SENSITIVE Sensitive     * >=100,000 COLONIES/mL ESCHERICHIA COLI         Radiology Studies: DG Chest Port 1 View  Result Date: 12/25/2020 CLINICAL DATA:  Dyspnea. EXAM: PORTABLE CHEST 1 VIEW COMPARISON:  December 22, 2020. FINDINGS: Limited evaluation due to portable technique with patient rotation. Enlarged cardiac silhouette. Right upper extremity approach PICC catheter with the tip projecting in the expected region of the SVC. Calcific atherosclerosis. Pulmonary vascular congestion. Left greater than right bibasilar opacities. No visible pneumothorax on this limited AP supine radiograph. No visible right pleural effusion. Limited evaluation for left pleural effusion due to patient rotation, left basilar opacities, and the left costophrenic sulcus being excluded from the field of view. Polyarticular degenerative change. IMPRESSION: 1. Limited evaluation due to portable technique with patient rotation. 2. Cardiomegaly and central pulmonary vascular congestion. 3. Left greater than right bibasilar opacities, which could represent atelectasis, aspiration, and/or pneumonia. Electronically Signed   By: Margaretha Sheffield MD   On: 12/25/2020 11:48        Scheduled Meds:  amiodarone  200 mg Per Tube q morning   atorvastatin  80 mg Per Tube Daily   chlorhexidine gluconate (MEDLINE KIT)  15 mL Mouth Rinse BID   Chlorhexidine Gluconate Cloth  6 each Topical Daily   cholecalciferol  2,000 Units Per Tube Daily   docusate  100 mg Per Tube BID   donepezil  5 mg Per Tube  Daily   DULoxetine  30 mg Oral BID   feeding supplement (PROSource TF)  45 mL Per Tube QID   guaiFENesin-dextromethorphan  10 mL Oral Q8H   insulin aspart  0-15 Units Subcutaneous Q4H   ipratropium  0.5 mg Nebulization BID   levalbuterol  0.63 mg Nebulization BID   levothyroxine  175 mcg Per Tube Daily   mouth rinse  15 mL Mouth Rinse BID   melatonin  3 mg Oral QHS   multivitamin  15 mL Per Tube Daily   pantoprazole (PROTONIX) IV  40 mg Intravenous Q12H   polyethylene glycol  17 g Per Tube Daily   potassium chloride  20 mEq Per Tube Daily   sodium chloride flush  10-40 mL Intracatheter Q12H   sodium chloride flush  3 mL Intravenous Q12H   Continuous Infusions:  sodium chloride Stopped (12/24/20 0854)    ceFAZolin (ANCEF) IV Stopped (12/26/20 0729)   dexmedetomidine (PRECEDEX) IV infusion 0.9 mcg/kg/hr (12/26/20 0802)   dextrose 5 % and 0.45% NaCl 50 mL/hr at 12/26/20 0802   levETIRAcetam 1,000 mg (12/26/20 1013)     LOS: 5 days    Time spent: 55 minutes of critical time was spent seeing evaluating patient, discussing care with consultants, palliative care, family meeting, planning current medical care... Adjusting medications,    Deatra James, MD [facility]  If 7PM-7AM, please contact night-coverage [email or web address] 12/26/2020, 12:31 PM

## 2020-12-27 LAB — CBC
HCT: 29 % — ABNORMAL LOW (ref 36.0–46.0)
Hemoglobin: 8.6 g/dL — ABNORMAL LOW (ref 12.0–15.0)
MCH: 27.8 pg (ref 26.0–34.0)
MCHC: 29.7 g/dL — ABNORMAL LOW (ref 30.0–36.0)
MCV: 93.9 fL (ref 80.0–100.0)
Platelets: 190 10*3/uL (ref 150–400)
RBC: 3.09 MIL/uL — ABNORMAL LOW (ref 3.87–5.11)
RDW: 16.5 % — ABNORMAL HIGH (ref 11.5–15.5)
WBC: 7.8 10*3/uL (ref 4.0–10.5)
nRBC: 0 % (ref 0.0–0.2)

## 2020-12-27 LAB — COMPREHENSIVE METABOLIC PANEL
ALT: 11 U/L (ref 0–44)
AST: 26 U/L (ref 15–41)
Albumin: 2.9 g/dL — ABNORMAL LOW (ref 3.5–5.0)
Alkaline Phosphatase: 56 U/L (ref 38–126)
Anion gap: 7 (ref 5–15)
BUN: 18 mg/dL (ref 8–23)
CO2: 30 mmol/L (ref 22–32)
Calcium: 8.6 mg/dL — ABNORMAL LOW (ref 8.9–10.3)
Chloride: 106 mmol/L (ref 98–111)
Creatinine, Ser: 0.95 mg/dL (ref 0.44–1.00)
GFR, Estimated: >60 mL/min (ref >60)
Glucose, Bld: 218 mg/dL — ABNORMAL HIGH (ref 70–99)
Potassium: 3.4 mmol/L — ABNORMAL LOW (ref 3.5–5.1)
Sodium: 143 mmol/L (ref 135–145)
Total Bilirubin: 0.5 mg/dL (ref 0.3–1.2)
Total Protein: 5.6 g/dL — ABNORMAL LOW (ref 6.5–8.1)

## 2020-12-27 LAB — GLUCOSE, CAPILLARY
Glucose-Capillary: 165 mg/dL — ABNORMAL HIGH (ref 70–99)
Glucose-Capillary: 173 mg/dL — ABNORMAL HIGH (ref 70–99)
Glucose-Capillary: 214 mg/dL — ABNORMAL HIGH (ref 70–99)
Glucose-Capillary: 215 mg/dL — ABNORMAL HIGH (ref 70–99)
Glucose-Capillary: 236 mg/dL — ABNORMAL HIGH (ref 70–99)
Glucose-Capillary: 270 mg/dL — ABNORMAL HIGH (ref 70–99)

## 2020-12-27 MED ORDER — APIXABAN 5 MG PO TABS
5.0000 mg | ORAL_TABLET | Freq: Two times a day (BID) | ORAL | Status: DC
  Administered 2020-12-27 – 2020-12-30 (×7): 5 mg via ORAL
  Filled 2020-12-27 (×7): qty 1

## 2020-12-27 MED ORDER — PANTOPRAZOLE SODIUM 40 MG PO TBEC
40.0000 mg | DELAYED_RELEASE_TABLET | Freq: Two times a day (BID) | ORAL | Status: DC
  Administered 2020-12-27: 40 mg via ORAL
  Filled 2020-12-27: qty 1

## 2020-12-27 MED ORDER — SODIUM CHLORIDE 0.9 % IV SOLN: INTRAVENOUS | Status: DC

## 2020-12-27 MED ORDER — PANTOPRAZOLE SODIUM 40 MG PO TBEC
40.0000 mg | DELAYED_RELEASE_TABLET | Freq: Every day | ORAL | Status: DC
  Administered 2020-12-29 – 2020-12-30 (×2): 40 mg via ORAL
  Filled 2020-12-27 (×3): qty 1

## 2020-12-27 MED ORDER — DULOXETINE HCL 20 MG PO CPEP
40.0000 mg | ORAL_CAPSULE | Freq: Two times a day (BID) | ORAL | Status: DC
  Administered 2020-12-27 – 2020-12-30 (×5): 40 mg via ORAL
  Filled 2020-12-27 (×11): qty 2

## 2020-12-27 NOTE — Progress Notes (Signed)
ANTICOAGULATION CONSULT NOTE - Initial Consult  Pharmacy Consult for Eliquis Indication: atrial fibrillation  Allergies  Allergen Reactions   Citalopram Hydrobromide Other (See Comments)    Dyskinesia Other reaction(s): Other (See Comments) Dyskinesia   Codeine Nausea And Vomiting and Other (See Comments)    HALLUCINATIONS Other reaction(s): Unknown Other reaction(s): Other (See Comments) HALLUCINATIONS    Hydromorphone Hcl Other (See Comments)    Made her pass out Other reaction(s): Other (See Comments) Made her pass out   Metoclopramide Other (See Comments)    DYSKINESIA Other reaction(s): Other (See Comments) DYSKINESIA   Hyoscyamine     MADE DIARRHEA WORSE    Patient Measurements: Height: 5\' 6"  (167.6 cm) Weight: 110.1 kg (242 lb 11.6 oz) IBW/kg (Calculated) : 59.3  Vital Signs: Temp: 98.3 F (36.8 C) (06/26 0800) Temp Source: Oral (06/26 0410) BP: 120/100 (06/26 0800) Pulse Rate: 79 (06/26 0700)  Labs: Recent Labs    12/25/20 0422 12/26/20 0441 12/27/20 0449  HGB 7.9* 8.1* 8.6*  HCT 27.1* 26.8* 29.0*  PLT 131* 156 190  CREATININE 0.86 0.94 0.95    Estimated Creatinine Clearance: 68.3 mL/min (by C-G formula based on SCr of 0.95 mg/dL).   Medical History: Past Medical History:  Diagnosis Date   Abnormal pulmonary function test    Anemia    H/H of 10/30 with a normal MCV in 12/09   Anxiety    Arthritis    Barrett's esophagus    Diagnosed 1995. Last EGD 2016-NO BARRETT'S.    Chest pain    Negative cardiac catheterization in 2002; negative stress nuclear study in 2008   Chronic anticoagulation    Chronic combined systolic and diastolic CHF (congestive heart failure) (HCC)    a. EF predominantly normal during prior echoes but was 40% during 10/2014 echo. b. Most recent 01/2015 EF normal, 55-60%.   Chronic LBP    Surgical intervention in 1996   Diabetes mellitus, type 2 (HCC)    Insulin therapy; exacerbated by prednisone   Dysrhythmia    AFib    Gastroparesis    99% retention 05/2008 on GES   GERD (gastroesophageal reflux disease)    Heart attack (HCC) 08/13/2020   Hiatal hernia    Hyperlipidemia    Hypertension    Hypothyroid    IBS (irritable bowel syndrome)    Obesity    OSA on CPAP    had CPAP and cannot tolerate.   Paroxysmal atrial fibrillation (HCC)    Pulmonary hypertension (HCC) 01/2015   a. Predominantly pulmonary venous hypertension but may be component of PAH.   Seizures (HCC)    last seizure was 2 years ago; on keppra for this; unknown etiology   Syncope    a. Admitted 05/2009; magnetic resonance imagin/ MRA - negative; etiology thought to be orthostasis secondary to drugs and dehydration. b. Syncope 02/2015 also felt 2/2 dehydration.    Medications:  Medications Prior to Admission  Medication Sig Dispense Refill Last Dose   acetaminophen (TYLENOL) 500 MG tablet Take 500 mg by mouth as needed for moderate pain or headache.    12/21/2020   amiodarone (PACERONE) 200 MG tablet TAKE 1 TABLET BY MOUTH EVERY MORNING 30 tablet 9 12/21/2020   apixaban (ELIQUIS) 5 MG TABS tablet Take 1 tablet (5 mg total) by mouth 2 (two) times daily. 60 tablet 11 12/21/2020 at 0730   atorvastatin (LIPITOR) 80 MG tablet Place 1 tablet (80 mg total) into feeding tube daily. 30 tablet 3 12/20/2020   Cholecalciferol (VITAMIN  D3) 2000 units capsule Take 2,000 Units by mouth daily.  11 12/21/2020   DULoxetine (CYMBALTA) 30 MG capsule Take 30 mg by mouth 2 (two) times daily.  0 12/20/2020   furosemide (LASIX) 20 MG tablet Take 1 tablet (20 mg total) by mouth daily. 30 tablet 3 12/21/2020   gabapentin (NEURONTIN) 100 MG capsule Take 1 capsule (100 mg total) by mouth 3 (three) times daily. 90 capsule 3 12/21/2020   insulin glargine (LANTUS) 100 UNIT/ML injection Inject 0.1 mLs (10 Units total) into the skin daily. (Patient taking differently: Inject 20 Units into the skin daily.) 10 mL 11 12/20/2020   Lactase 9000 units CHEW 1 PO Q4H WHEN YOU EAT ICE CREAM  OR CHEESE (Patient taking differently: Chew 1 tablet by mouth every 4 (four) hours as needed (when eating dairy products).) 90 tablet 11 PRN   levETIRAcetam (KEPPRA) 500 MG tablet Take 2 tablets (1,000 mg total) by mouth 2 (two) times daily. (Patient taking differently: Take 1,000 mg by mouth daily.) 90 tablet 3 12/21/2020   levothyroxine (SYNTHROID) 175 MCG tablet Take 1 tablet by mouth daily.   12/21/2020   loperamide (IMODIUM) 2 MG capsule 1 PO Q4H PRN LOOSE STOOLS (Patient taking differently: Take 4 mg by mouth every 4 (four) hours as needed. 1 PO Q4H PRN LOOSE STOOLS) 120 capsule 11 PRN   LORazepam (ATIVAN) 0.5 MG tablet Take 0.5 mg by mouth every 8 (eight) hours as needed for anxiety or sedation.   PRN   meclizine (ANTIVERT) 25 MG tablet Take 1 tablet (25 mg total) by mouth every 8 (eight) hours as needed for dizziness. 90 tablet 1 Past Month   Multiple Vitamin (MULTIVITAMIN WITH MINERALS) TABS tablet Take 1 tablet by mouth daily.   12/21/2020   ondansetron (ZOFRAN) 4 MG tablet TAKE 1 TABLET BY MOUTH FOUR TIMES DAILY AS NEEDED FOR NAUSEA (Patient taking differently: Take 4 mg by mouth 4 (four) times daily as needed for nausea.) 120 tablet 1 PRN   OXYGEN 2 L daily.    12/21/2020   pantoprazole (PROTONIX) 40 MG tablet TAKE ONE TABLET BY MOUTH TWICE DAILY BEFORE APPLY MEAL (Patient taking differently: Take 40 mg by mouth 2 (two) times daily.) 180 tablet 3 12/21/2020   potassium chloride (KLOR-CON) 10 MEQ tablet Take 20 mEq by mouth daily.   12/21/2020   SM MELATONIN 3 MG TABS tablet Take 3 mg by mouth at bedtime as needed for sleep.   PRN   feeding supplement (ENSURE ENLIVE / ENSURE PLUS) LIQD Take 237 mLs by mouth 3 (three) times daily between meals. 237 mL 12     Assessment: Patient anticoagulated chronically for afib. Patient developed some rectal bleeding on 6/23 and anticoagulation held. Now MD requesting restart eliquis. Hgb stabilized at 8.6.  Goal of Therapy:  Monitor platelets by  anticoagulation protocol: Yes   Plan:  Eliquis 5mg  po bid Monitor for S/S of bleeding CBC MWF  , BS Elder Cyphers, BCPS Clinical Pharmacist Pager [phone] 12/27/2020,8:44 AM

## 2020-12-27 NOTE — Progress Notes (Signed)
PROGRESS NOTE    [name]  YHC:[id number] DOB: 29-Nov-1950 DOA: 12/21/2020 PCP: Lemmie Evens, MD   Subjective: The patient was seen and examined this morning much more awake, more interactive, satting 93% on 4 L of oxygen  Status postextubation 12/23/2020    Brief Narrative:   [name] is a 70 y.o. female with medical history significant for obesity, type 2 diabetes, chronic atrial fibrillation on Eliquis, dementia, diastolic CHF with chronic hypoxemia on 2 L nasal cannula, hypothyroidism, seizure disorder, CKD stage III, CAD with prior NSTEMI, dyslipidemia, and GERD who presented to the ED with worsening shortness of breath over the past couple days.  She was admitted with acute on chronic hypoxemic and hypercarbic respiratory failure in the setting of CHF exacerbation.  BiPAP was initially attempted, but she could not tolerate this and therefore was intubated.  PCCM now following for ventilator management.  She is having hypotension requiring pressor use.  Assessment & Plan:   Active Problems:   Atrial fibrillation with RVR (HCC)   Acute respiratory failure with hypoxia and hypercapnia (HCC)   Acute hypoxemic respiratory failure (HCC)   Elevated troponin   Acute on chronic hypoxemic and hypercarbic respiratory failure -Extubated 12/23/2020 -O2 demand has stabilized, and reduced in past 48 hours from 9 L to 4 L, currently satting 93% Much more awake, improved respiratory efforts  -We do not recommend reintubation due to poor prognosis -Pulmonary team agrees, patient's daughter would like to continue with a full scope of care Including reintubation, and possible tracheostomy if needed For that she needs to be transferred to [facility] following -(h/o prior intubations and tracheostomy) -Noted to have pulmonary arterial hypertension on prior CT 11/2020 -COVID testing negative; vaccinated and boosted -IV Lasix twice daily -was on hold due to hypotension...  Anticipating discontinuing IV fluid, resuming Lasix if patient remained stable -Monitor in ICU   Toxic metabolic encephalopathy  -Mental status much improved, awake communicative and cooperative this morning -Post extubation patient remained confused agitated somnolent...  -Daughter mental status continue to improve, somnolent this a.m. -de-escalating Precedex .... Discontinuing Precedex 6/26 -as needed Haldol   acute on chronic diastolic CHF exacerbation -Remained stable, limiting IV fluid -Recent 2D echocardiogram 5/22 with LVEF 55-60% and indeterminate diastolic parameter -Strict I's and O's -Monitor daily weights   Hypotension likely related to sedation Much improved, BP stabilized -Reducing IV fluid rate, anticipating discontinuing -off Levophed -(was started on norepinephrine and PICC line placed 6/21... Discontinued 12/24/2020)   Elevated troponin -Likely due to above, ischemic demand, -Denies any chest pain, A. fib with no acute changes  Chronic atrial fibrillation- -Tachycardia has improved, has been on Lovenox was held due to brief rectal bleed, H&H is stable -Anticipating restarting Eliquis today 12/27/2020 -Cardiology consulted, currently poor p.o. intake, if RVR develops, considering amiodarone IV -If H&H remained stable, anticipating restarting Eliquis  Type 2 diabetes -Recent hemoglobin A1c 7.6% -We will encourage p.o. intake, initiating CBG every 4 hours, with SSI coverage -As p.o. intake improves will adjust insulin coverage   AKI on CKD stage IIIa --Responded to IV fluids, BUN/creatinine stabilized -Responded to IV fluids,-discontinued   CAD with prior NSTEMI/dyslipidemia -Continue statin    Anemia of chronic disease -Baseline hemoglobin near 10, transfuse for hemoglobin less than 7 -Current hemoglobin 8.8, >> 9.4, 8.6 stable  OSA/OHS -Continue supplemental oxygen, CPAP if she tolerates   Hypothyroidism -Recent TSH 0.526 -Continue Synthroid     History of seizures -Continue Keppra, now IV.Marland KitchenMarland Kitchen  If tolerating p.o. will be switched to p.o.   History of dementia -Continue Aricept   Stage I sacral ulcer -Present on admission -Wound care   Obesity -BMI 36.64 -Lifestyle changes outpatient  Rectal bleed -H&H stable, with holding Lovenox at this time Monitoring for further rectal bleed, if her H&H drops, continues to have rectal bleed, will consider consulting gastroenterology for evaluation Patient daughter and nursing staff does not report any further rectal bleeding   DVT prophylaxis: Lovenox was on held due to rectal bleeding which has improved, resuming Eliquis today  Code Status:  Discussed with daughter at bedside 12/27/2020  ... Palliative care also has discussed patient care in detail along with Dr. Melvyn Novas.... Her daughter continues to have unrealistic expectations, such as reintubation and tracheostomy It was explained to patient daughter that once patient has tracheostomy will be very difficult to discharge her home likely will end up and LTAC Also was explained that tracheostomy would not help the underlying issue for lung and heart disease.   Family Communication: Daughter, [name] at bedside 6/26  Disposition Plan:  Status is: Inpatient  Remains inpatient appropriate because:Hemodynamically unstable, IV treatments appropriate due to intensity of illness or inability to take PO, and Inpatient level of care appropriate due to severity of illness  Dispo: The patient is from: Home              Anticipated d/c is to: SNF ???  Versus Home with home health              Patient currently is not medically stable to d/c.   Difficult to place patient No  Skin Assessment:  I have examined the patient's skin and I agree with the wound assessment as performed by the wound care RN as outlined below:  Pressure Injury 11/21/20 Sacrum Medial Stage 1 -  Intact skin with non-blanchable redness of a localized area usually over a  bony prominence. Stage 1 pressure Ulcer non blanchable (Active)  11/21/20 2259  Location: Sacrum  Location Orientation: Medial  Staging: Stage 1 -  Intact skin with non-blanchable redness of a localized area usually over a bony prominence.  Wound Description (Comments): Stage 1 pressure Ulcer non blanchable  Present on Admission: Yes    Consultants:  PCCM Palliative  Procedures:  Intubation 6/20 PICC line 6/21 See below  Antimicrobials:  Anti-infectives (From admission, onward)    Start     Dose/Rate Route Frequency Ordered Stop   12/23/20 1400  ceFAZolin (ANCEF) IVPB 1 g/50 mL premix        1 g 100 mL/hr over 30 Minutes Intravenous Every 8 hours 12/23/20 1306          Objective: Vitals:   12/27/20 0600 12/27/20 0700 12/27/20 0800 12/27/20 1100  BP: (!) 107/59 136/71 (!) 120/100 131/74  Pulse: 69 79  96  Resp: 19 19 (!) 27 18  Temp:   98.3 F (36.8 C)   TempSrc:      SpO2: 94% 98%  93%  Weight:      Height:        Intake/Output Summary (Last 24 hours) at 12/27/2020 1238 Last data filed at 12/27/2020 1110 Gross per 24 hour  Intake 1953.29 ml  Output --  Net 1953.29 ml   Filed Weights   12/25/20 0500 12/26/20 0441 12/27/20 0410  Weight: 105.8 kg 109.3 kg 110.1 kg         Physical Exam:   General:  Much more awake today, cooperative  HEENT:  Normocephalic, PERRL, otherwise with in Normal limits   Neuro:  CNII-XII intact. , normal motor and sensation, reflexes intact   Lungs:   Diffuse Rales, rhonchi, mild crackles at lower bases, unlabored respiratory effort positive air sounds  Cardio:    S1/S2, RRR, No murmure, No Rubs or Gallops   Abdomen:   Soft, non-tender, bowel sounds active all four quadrants,  no guarding or peritoneal signs.  Muscular skeletal:  Severe generalized weaknesses, Limited exam - in bed, able to move all 4 extremities, Normal strength,  2+ pulses,  symmetric, No pitting edema  Skin:  Dry, warm to touch, negative for any Rashes,   Wounds: Please see nursing documentation  Pressure Injury 11/21/20 Sacrum Medial Stage 1 -  Intact skin with non-blanchable redness of a localized area usually over a bony prominence. Stage 1 pressure Ulcer non blanchable (Active)  11/21/20 2259  Location: Sacrum  Location Orientation: Medial  Staging: Stage 1 -  Intact skin with non-blanchable redness of a localized area usually over a bony prominence.  Wound Description (Comments): Stage 1 pressure Ulcer non blanchable  Present on Admission: Yes          Data Reviewed: I have personally reviewed following labs and imaging studies  CBC: Recent Labs  Lab 12/21/20 0953 12/22/20 0652 12/23/20 0340 12/24/20 1806 12/25/20 0422 12/26/20 0441 12/27/20 0449  WBC 5.7   < > 10.4 6.4 5.2 5.6 7.8  NEUTROABS 3.6  --   --   --   --   --   --   HGB 9.5*   < > 9.4* 8.2* 7.9* 8.1* 8.6*  HCT 32.4*   < > 30.3* 27.9* 27.1* 26.8* 29.0*  MCV 98.8   < > 92.9 96.2 95.4 94.0 93.9  PLT 221   < > 219 141* 131* 156 190   < > = values in this interval not displayed.   Basic Metabolic Panel: Recent Labs  Lab 12/22/20 0652 12/23/20 0340 12/25/20 0422 12/25/20 1700 12/26/20 0441 12/26/20 1604 12/27/20 0449  NA 142 142 145  --  144  --  143  K 3.9 3.1* 3.7  --  3.4*  --  3.4*  CL 100 102 107  --  107  --  106  CO2 31 30 33*  --  26  --  30  GLUCOSE 185* 139* 110*  --  214*  --  218*  BUN '22 22 23  ' --  23  --  18  CREATININE 1.24* 1.15* 0.86  --  0.94  --  0.95  CALCIUM 8.4* 8.1* 8.1*  --  8.3*  --  8.6*  MG 2.1 1.9 2.0 2.0 2.1 2.2  --   PHOS  --   --  3.8 3.7 3.3 2.7  --    GFR: Estimated Creatinine Clearance: 68.3 mL/min (by C-G formula based on SCr of 0.95 mg/dL). Liver Function Tests:  Recent Labs  Lab 12/26/20 1930 12/26/20 2324 12/27/20 0405 12/27/20 0759 12/27/20 1140  GLUCAP 211* 222* 165* 173* 215*   Lipid Profile: Recent Labs    12/25/20 0422  TRIG 50   Thyroid Function Tests: No results for input(s): TSH,  T4TOTAL, FREET4, T3FREE, THYROIDAB in the last 72 hours. Anemia Panel: No results for input(s): VITAMINB12, FOLATE, FERRITIN, TIBC, IRON, RETICCTPCT in the last 72 hours. Sepsis Labs: No results for input(s): PROCALCITON, LATICACIDVEN in the last 168 hours.  Recent Results (from the past 240 hour(s))  MRSA Next Gen by PCR, Nasal  Status: None   Collection Time: 12/21/20  9:20 AM   Specimen: Nasal Mucosa; Nasal Swab  Result Value Ref Range Status   MRSA by PCR Next Gen NOT DETECTED NOT DETECTED Final    Comment: (NOTE) The GeneXpert MRSA Assay (FDA approved for NASAL specimens only), is one component of a comprehensive MRSA colonization surveillance program. It is not intended to diagnose MRSA infection nor to guide or monitor treatment for MRSA infections. Test performance is not FDA approved in patients less than 90 years old. Performed at [facility], [address], [place], [place] [address]   Resp Panel by RT-PCR (Flu A&B, Covid) Nasopharyngeal Swab     Status: None   Collection Time: 12/21/20 11:45 AM   Specimen: Nasopharyngeal Swab; Nasopharyngeal(NP) swabs in vial transport medium  Result Value Ref Range Status   SARS Coronavirus 2 by RT PCR NEGATIVE NEGATIVE Final    Comment: (NOTE) SARS-CoV-2 target nucleic acids are NOT DETECTED.  The SARS-CoV-2 RNA is generally detectable in upper respiratory specimens during the acute phase of infection. The lowest concentration of SARS-CoV-2 viral copies this assay can detect is 138 copies/mL. A negative result does not preclude SARS-Cov-2 infection and should not be used as the sole basis for treatment or other patient management decisions. A negative result may occur with  improper specimen collection/handling, submission of specimen other than nasopharyngeal swab, presence of viral mutation(s) within the areas targeted by this assay, and inadequate number of viral copies(<138 copies/mL). A negative result must be combined  with clinical observations, patient history, and epidemiological information. The expected result is Negative.  Fact Sheet for Patients:  EntrepreneurPulse.com.au  Fact Sheet for Healthcare Providers:  IncredibleEmployment.be  This test is no t yet approved or cleared by the [place] FDA and  has been authorized for detection and/or diagnosis of SARS-CoV-2 by FDA under an Emergency Use Authorization (EUA). This EUA will remain  in effect (meaning this test can be used) for the duration of the COVID-19 declaration under Section 564(b)(1) of the Act, 21 U.S.C.section 360bbb-3(b)(1), unless the authorization is terminated  or revoked sooner.       Influenza A by PCR NEGATIVE NEGATIVE Final   Influenza B by PCR NEGATIVE NEGATIVE Final    Comment: (NOTE) The Xpert Xpress SARS-CoV-2/FLU/RSV plus assay is intended as an aid in the diagnosis of influenza from Nasopharyngeal swab specimens and should not be used as a sole basis for treatment. Nasal washings and aspirates are unacceptable for Xpert Xpress SARS-CoV-2/FLU/RSV testing.  Fact Sheet for Patients: EntrepreneurPulse.com.au  Fact Sheet for Healthcare Providers: IncredibleEmployment.be  This test is not yet approved or cleared by the [place] FDA and has been authorized for detection and/or diagnosis of SARS-CoV-2 by FDA under an Emergency Use Authorization (EUA). This EUA will remain in effect (meaning this test can be used) for the duration of the COVID-19 declaration under Section 564(b)(1) of the Act, 21 U.S.C. section 360bbb-3(b)(1), unless the authorization is terminated or revoked.  Performed at [facility], [address], [place], [place] [address]   Culture, Urine     Status: Abnormal   Collection Time: 12/21/20  4:43 PM   Specimen: Urine, Random  Result Value Ref Range Status   Specimen Description   Final    URINE,  RANDOM Performed at [facility], [address], [place],  [address]    Special Requests   Final    NONE Performed at [facility], [address] [place], [place]  [address]    Culture >=100,000 COLONIES/mL ESCHERICHIA COLI (A)  Final   Report Status 12/24/2020 FINAL  Final   Organism ID, Bacteria ESCHERICHIA COLI (A)  Final      Susceptibility   Escherichia coli - MIC*    AMPICILLIN <=2 SENSITIVE Sensitive     CEFAZOLIN <=4 SENSITIVE Sensitive     CEFEPIME <=0.12 SENSITIVE Sensitive     CEFTRIAXONE <=0.25 SENSITIVE Sensitive     CIPROFLOXACIN <=0.25 SENSITIVE Sensitive     GENTAMICIN <=1 SENSITIVE Sensitive     IMIPENEM <=0.25 SENSITIVE Sensitive     NITROFURANTOIN <=16 SENSITIVE Sensitive     TRIMETH/SULFA <=20 SENSITIVE Sensitive     AMPICILLIN/SULBACTAM <=2 SENSITIVE Sensitive     PIP/TAZO <=4 SENSITIVE Sensitive     * >=100,000 COLONIES/mL ESCHERICHIA COLI         Radiology Studies: No results found.      Scheduled Meds:  amiodarone  200 mg Oral q morning   apixaban  5 mg Oral BID   atorvastatin  80 mg Oral Daily   chlorhexidine gluconate (MEDLINE KIT)  15 mL Mouth Rinse BID   Chlorhexidine Gluconate Cloth  6 each Topical Daily   cholecalciferol  2,000 Units Oral Daily   docusate  100 mg Oral BID   donepezil  5 mg Oral Daily   DULoxetine  40 mg Oral BID   guaiFENesin-dextromethorphan  10 mL Oral Q8H   insulin aspart  0-15 Units Subcutaneous Q4H   ipratropium  0.5 mg Nebulization BID   levalbuterol  0.63 mg Nebulization BID   levothyroxine  175 mcg Oral Daily   mouth rinse  15 mL Mouth Rinse BID   melatonin  3 mg Oral QHS   multivitamin  15 mL Oral Daily   [START ON 12/28/2020] pantoprazole  40 mg Oral Daily   polyethylene glycol  17 g Per Tube Daily   potassium chloride  20 mEq Oral Daily   sodium chloride flush  10-40 mL Intracatheter Q12H   sodium chloride flush  3 mL Intravenous Q12H   Continuous Infusions:  sodium chloride Stopped  (12/24/20 0854)    ceFAZolin (ANCEF) IV Stopped (12/27/20 0532)   dextrose 5 % and 0.45% NaCl 50 mL/hr at 12/27/20 1110   levETIRAcetam 1,000 mg (12/27/20 1138)     LOS: 6 days    Time spent: 55 minutes of critical time was spent seeing evaluating patient, discussing care with consultants, palliative care, family meeting, planning current medical care... Adjusting medications,    Deatra James, MD [facility]  If 7PM-7AM, please contact night-coverage [email or web address] 12/27/2020, 12:38 PM

## 2020-12-28 DIAGNOSIS — I248 Other forms of acute ischemic heart disease: Secondary | ICD-10-CM

## 2020-12-28 DIAGNOSIS — I4821 Permanent atrial fibrillation: Secondary | ICD-10-CM

## 2020-12-28 LAB — CBC
HCT: 27.6 % — ABNORMAL LOW (ref 36.0–46.0)
Hemoglobin: 8.2 g/dL — ABNORMAL LOW (ref 12.0–15.0)
MCH: 28.3 pg (ref 26.0–34.0)
MCHC: 29.7 g/dL — ABNORMAL LOW (ref 30.0–36.0)
MCV: 95.2 fL (ref 80.0–100.0)
Platelets: 181 10*3/uL (ref 150–400)
RBC: 2.9 MIL/uL — ABNORMAL LOW (ref 3.87–5.11)
RDW: 16.7 % — ABNORMAL HIGH (ref 11.5–15.5)
WBC: 8.1 10*3/uL (ref 4.0–10.5)
nRBC: 0.2 % (ref 0.0–0.2)

## 2020-12-28 LAB — COMPREHENSIVE METABOLIC PANEL
ALT: 9 U/L (ref 0–44)
AST: 29 U/L (ref 15–41)
Albumin: 2.8 g/dL — ABNORMAL LOW (ref 3.5–5.0)
Alkaline Phosphatase: 75 U/L (ref 38–126)
Anion gap: 7 (ref 5–15)
BUN: 14 mg/dL (ref 8–23)
CO2: 29 mmol/L (ref 22–32)
Calcium: 8.5 mg/dL — ABNORMAL LOW (ref 8.9–10.3)
Chloride: 109 mmol/L (ref 98–111)
Creatinine, Ser: 0.91 mg/dL (ref 0.44–1.00)
GFR, Estimated: >60 mL/min (ref >60)
Glucose, Bld: 221 mg/dL — ABNORMAL HIGH (ref 70–99)
Potassium: 3.1 mmol/L — ABNORMAL LOW (ref 3.5–5.1)
Sodium: 145 mmol/L (ref 135–145)
Total Bilirubin: 0.5 mg/dL (ref 0.3–1.2)
Total Protein: 5.4 g/dL — ABNORMAL LOW (ref 6.5–8.1)

## 2020-12-28 LAB — TRIGLYCERIDES: Triglycerides: 60 mg/dL (ref <150)

## 2020-12-28 LAB — GLUCOSE, CAPILLARY
Glucose-Capillary: 222 mg/dL — ABNORMAL HIGH (ref 70–99)
Glucose-Capillary: 227 mg/dL — ABNORMAL HIGH (ref 70–99)
Glucose-Capillary: 201 mg/dL — ABNORMAL HIGH (ref 70–99)
Glucose-Capillary: 117 mg/dL — ABNORMAL HIGH (ref 70–99)
Glucose-Capillary: 123 mg/dL — ABNORMAL HIGH (ref 70–99)

## 2020-12-28 MED ORDER — INSULIN GLARGINE 100 UNIT/ML ~~LOC~~ SOLN
10.0000 [IU] | Freq: Every day | SUBCUTANEOUS | Status: DC
  Administered 2020-12-28 – 2020-12-30 (×3): 10 [IU] via SUBCUTANEOUS
  Filled 2020-12-28 (×4): qty 0.1

## 2020-12-28 MED ORDER — CEFAZOLIN SODIUM-DEXTROSE 1-4 GM/50ML-% IV SOLN
1.0000 g | Freq: Three times a day (TID) | INTRAVENOUS | Status: AC
  Administered 2020-12-28 – 2020-12-29 (×4): 1 g via INTRAVENOUS
  Filled 2020-12-28 (×4): qty 50

## 2020-12-28 MED ORDER — METOPROLOL TARTRATE 5 MG/5ML IV SOLN: 5.0000 mg | Freq: Once | INTRAVENOUS | Status: AC

## 2020-12-28 MED ORDER — FUROSEMIDE 20 MG PO TABS
20.0000 mg | ORAL_TABLET | Freq: Every day | ORAL | Status: DC
  Administered 2020-12-29 – 2020-12-30 (×2): 20 mg via ORAL
  Filled 2020-12-28 (×2): qty 1

## 2020-12-28 MED ORDER — METOPROLOL TARTRATE 5 MG/5ML IV SOLN
INTRAVENOUS | Status: AC
  Administered 2020-12-28: 5 mg
  Filled 2020-12-28: qty 5

## 2020-12-28 MED ORDER — LEVETIRACETAM 100 MG/ML PO SOLN
1000.0000 mg | Freq: Two times a day (BID) | ORAL | Status: DC
  Administered 2020-12-28 – 2020-12-29 (×4): 1000 mg via ORAL
  Filled 2020-12-28 (×9): qty 10

## 2020-12-28 NOTE — Progress Notes (Signed)
  Speech Language Pathology Treatment: Dysphagia  Patient Details Name: [name] MRN: [id number] DOB: 05/19/1951 Today's Date: 12/28/2020 Time: 9509-3267 SLP Time Calculation (min) (ACUTE ONLY): 24 min  Assessment / Plan / Recommendation Clinical Impression  Pt seen for ongoing dysphagia intervention. RN reports that Pt is lucid and tolerating diet well. [name] is eager for Pt to receive something besides puree. Pt assessed with ice chips, cup sips thin, mech soft, and regular textures. Pt with one initial cough after small sip of thin water, but was also talking with her daughter at the time. She demonstrated prolonged mastication, bolus manipulation, and impaired mastication with pineapple fruit cocktail and eventually expectorated some of it. She masticated small pieces of graham cracker without incident. Pt without additional signs of reduced airway protection. Recommend D3/mech soft and thin liquids via cup sips and po medications whole in puree or with water. SLP will follow up in acute setting. Pt and daughter in agreement with plan of care and pleased with advancement.    HPI HPI: [name] is a 70 y.o. female with medical history significant for obesity, type 2 diabetes, chronic atrial fibrillation on Eliquis, dementia, diastolic CHF with chronic hypoxemia on 2 L nasal cannula, hypothyroidism, seizure disorder, CKD stage III, CAD with prior NSTEMI, dyslipidemia, and GERD who presented to the ED with worsening shortness of breath over the past couple days.  She was admitted with acute on chronic hypoxemic and hypercarbic respiratory failure in the setting of CHF exacerbation.  BiPAP was initially attempted, but she could not tolerate this and therefore was intubated. She was extubated 6/22 but has not been alert/stable/responsive to be appropriate for BSE despite multiple attempts until today.      SLP Plan  Continue with current plan of care       Recommendations  Diet  recommendations: Dysphagia 3 (mechanical soft);Thin liquid Liquids provided via: Cup;No straw Medication Administration: Whole meds with puree Supervision: Full supervision/cueing for compensatory strategies;Patient able to self feed Compensations: Minimize environmental distractions;Slow rate;Small sips/bites Postural Changes and/or Swallow Maneuvers: Seated upright 90 degrees;Upright 30-60 min after meal                Oral Care Recommendations: Oral care BID;Staff/trained caregiver to provide oral care Follow up Recommendations: 24 hour supervision/assistance SLP Visit Diagnosis: Dysphagia, unspecified (R13.10) Plan: Continue with current plan of care       Thank you,  Havery Moros, CCC-SLP [phone]                 Marius Betts 12/28/2020, 11:07 AM

## 2020-12-28 NOTE — Plan of Care (Signed)
  Problem: Acute Rehab PT Goals(only PT should resolve) Goal: Pt Will Go Supine/Side To Sit Outcome: Progressing Flowsheets (Taken 12/28/2020 1440) Pt will go Supine/Side to Sit: with min guard assist Goal: Pt Will Go Sit To Supine/Side Outcome: Progressing Flowsheets (Taken 12/28/2020 1440) Pt will go Sit to Supine/Side: with min guard assist Goal: Patient Will Transfer Sit To/From Stand Outcome: Progressing Flowsheets (Taken 12/28/2020 1440) Patient will transfer sit to/from stand: with min guard assist Goal: Pt Will Transfer Bed To Chair/Chair To Bed Outcome: Progressing Flowsheets (Taken 12/28/2020 1440) Pt will Transfer Bed to Chair/Chair to Bed: min guard assist Note: W/ RW Goal: Pt Will Ambulate Outcome: Progressing Flowsheets (Taken 12/28/2020 1440) Pt will Ambulate:  50 feet  with min guard assist  with rolling walker  2:40 PM, 12/28/20 M. Shary Decamp, PT, DPT Physical Therapist- [place] Office Number: [phone]

## 2020-12-28 NOTE — Progress Notes (Signed)
   Progress Note  Patient Name: [name] Date of Encounter: 12/28/2020  Primary Cardiologist: Dina Rich, MD  Cardiology note from June 24 reviewed, also went over interval hospital course.  Patient remains on supplemental oxygen via nasal cannula, has continued follow-up by Pulmonary.  She has been seen by the Palliative Care team as well.  Plan remains full supportive measures at this point.  She is afebrile, heart rate 80s to low 100s in atrial fibrillation by telemetry which I personally reviewed.  Systolic ranging 035D to 160s most recently.  Pertinent lab work includes creatinine 0.91, hemoglobin 8.2, platelets 181, potassium 3.1.  Recent echocardiogram in May indicated LVEF 55 to 60% range, also normal RV contraction with severe pulmonary hypertension.  Present cardiac regimen includes Eliquis, amiodarone, and potassium supplementation.  Diuretics remain on hold.  Overall cardiac plan is continued rate control of atrial fibrillation and stroke prophylaxis with anticoagulation as tolerated (she has had acute on chronic anemia with intermittent rectal bleeding).  Minor elevation in high-sensitivity troponin I is consistent with demand ischemia in the setting of acute illness, she had normal coronaries by cardiac catheterization in February.  No plan for further ischemic testing at this time.  We are following in the background.  Signed, Nona Dell, MD  12/28/2020, 9:11 AM

## 2020-12-28 NOTE — Evaluation (Signed)
Physical Therapy Evaluation Patient Details Name: [name] MRN: [id number] DOB: [birth year] Today's Date: 12/28/2020   History of Present Illness  [name] is a 70 y.o. female with medical history significant for obesity, type 2 diabetes, chronic atrial fibrillation on Eliquis, dementia, diastolic CHF with chronic hypoxemia on 2 L nasal cannula, hypothyroidism, seizure disorder, CKD stage III, CAD with prior NSTEMI, dyslipidemia, and GERD who presented to the ED with worsening shortness of breath over the past couple days.  Daughter at bedside states that she has had approximately 20 pound weight gain noted as well over the same timeframe.  Her symptoms were worsening and EMS was called and upon arrival she was noted to have pulse oximetry of 55%.  She was noted to be taking her medications normally according to the daughter and was wearing her home oxygen.  She denied any chest pain, cough, fevers, or chills.  Some mild lower extremity edema has been noted as well.  No other sick contacts and she has had her COVID vaccines and booster shots.  She appears to have been admitted just last month with acute on chronic diastolic CHF.  Her discharge weight at that time was 214 pounds.  She is currently at 227 pounds.   Clinical Impression   Patient presents with generalized weakness, reduced functional independence, increased need for physical assistance in mobility, and reduced ability to safely ambulate.  Patient performed very well throughout therapy evaluation and tx session demonstrating excellent participation and able to perform total of five instances of sit to stand transfers with mod A and able to initiate small steps for ambulation, albeit with limited distance due to fatigue at this point.  Patient has supportive family and is motivated to return home with home health services to improve her functional status.  Continued PT sessions indicated while hospitalized to improve functional mobility  and reduce level of assistance from caregivers. Vital signs WNL throughout encounter and no overt fatigue or exertion appreciated.     Follow Up Recommendations Home health PT;Supervision for mobility/OOB    Equipment Recommendations  None recommended by PT    Recommendations for Other Services       Precautions / Restrictions        Mobility  Bed Mobility Overal bed mobility: Needs Assistance Bed Mobility: Supine to Sit;Sit to Supine     Supine to sit: Mod assist Sit to supine: Mod assist     Patient Response: Cooperative  Transfers Overall transfer level: Needs assistance Equipment used: Rolling walker (2 wheeled) Transfers: Sit to/from [employer] Sit to Stand: Mod assist Stand pivot transfers: Mod assist          Ambulation/Gait Ambulation/Gait assistance: Min assist Gait Distance (Feet): 5 Feet Assistive device: Rolling walker (2 wheeled) Gait Pattern/deviations: Step-to pattern;Decreased step length - right;Decreased step length - left        Stairs            Wheelchair Mobility    Modified Rankin (Stroke Patients Only)       Balance Overall balance assessment: Needs assistance Sitting-balance support: Single extremity supported Sitting balance-Leahy Scale: Fair     Standing balance support: Bilateral upper extremity supported Standing balance-Leahy Scale: Fair                               Pertinent Vitals/Pain Pain Assessment: No/denies pain    Home Living Family/patient expects to be discharged  to:: Private residence Living Arrangements: Children;Spouse/significant other Available Help at Discharge: Family;Available 24 hours/day Type of Home: Mobile home Home Access: Ramped entrance     Home Layout: One level Home Equipment: Bedside commode;Toilet riser;Shower seat;Walker - 2 wheels;Cane - single point;Wheelchair - manual Additional Comments: wears O2 2L at home.  Lives with daughter, and  elderly spouse.  They are able to provide 24 hour supervision up to min A    Prior Function Level of Independence: Needs assistance   Gait / Transfers Assistance Needed: [place] ambulator using RW, on 2 LPM O2 constant  ADL's / Homemaking Assistance Needed: assisted by family  Comments: history of falls     Hand Dominance   Dominant Hand: Right    Extremity/Trunk Assessment   Upper Extremity Assessment Upper Extremity Assessment: Generalized weakness    Lower Extremity Assessment Lower Extremity Assessment: Generalized weakness       Communication   Communication: No difficulties  Cognition Arousal/Alertness: Awake/alert Behavior During Therapy: WFL for tasks assessed/performed Overall Cognitive Status: Within Functional Limits for tasks assessed                                        General Comments      Exercises General Exercises - Lower Extremity Ankle Circles/Pumps: AROM;Both;20 reps;Supine Quad Sets: Strengthening;Both;20 reps;Supine Heel Slides: AAROM;Both;10 reps;Supine Hip ABduction/ADduction: Strengthening;Both;Supine;10 reps   Assessment/Plan    PT Assessment Patient needs continued PT services  PT Problem List Decreased strength;Decreased activity tolerance;Decreased balance;Decreased mobility;Cardiopulmonary status limiting activity;Obesity       PT Treatment Interventions DME instruction;Gait training;Functional mobility training;Stair training;Therapeutic activities;Patient/family education;Balance training;Therapeutic exercise    PT Goals (Current goals can be found in the Care Plan section)  Acute Rehab PT Goals Patient Stated Goal: "Home with home health services" PT Goal Formulation: With patient/family Time For Goal Achievement: 01/04/21 Potential to Achieve Goals: Good    Frequency Min 3X/week   Barriers to discharge        Co-evaluation               AM-PAC PT "6 Clicks" Mobility  Outcome Measure Help  needed turning from your back to your side while in a flat bed without using bedrails?: A Little Help needed moving from lying on your back to sitting on the side of a flat bed without using bedrails?: A Little Help needed moving to and from a bed to a chair (including a wheelchair)?: A Little Help needed standing up from a chair using your arms (e.g., wheelchair or bedside chair)?: A Little Help needed to walk in hospital room?: A Lot Help needed climbing 3-5 steps with a railing? : A Lot 6 Click Score: 16    End of Session Equipment Utilized During Treatment: Gait belt Activity Tolerance: Patient tolerated treatment well Patient left: with call bell/phone within reach;in bed;with family/visitor present Nurse Communication: Mobility status PT Visit Diagnosis: Unsteadiness on feet (R26.81);Muscle weakness (generalized) (M62.81);Difficulty in walking, not elsewhere classified (R26.2)    Time: 6283-6629 PT Time Calculation (min) (ACUTE ONLY): 30 min   Charges:   PT Evaluation $PT Eval Moderate Complexity: 1 Mod PT Treatments $Therapeutic Exercise: 8-22 mins $Therapeutic Activity: 8-22 mins       2:38 PM, 12/28/20 M. Shary Decamp, PT, DPT Physical Therapist- [place] Office Number: [phone]

## 2020-12-28 NOTE — Progress Notes (Signed)
Inpatient Diabetes Program Recommendations  AACE/ADA: New Consensus Statement on Inpatient Glycemic Control   Target Ranges:  Prepandial:   less than 140 mg/dL      Peak postprandial:   less than 180 mg/dL (1-2 hours)      Critically ill patients:  140 - 180 mg/dL   Results for JOZY, MCPHEARSON (MRN [id number]) as of 12/28/2020 11:10  Ref. Range 12/27/2020 07:59 12/27/2020 11:40 12/27/2020 16:51 12/27/2020 20:49 12/27/2020 23:33 12/28/2020 03:24 12/28/2020 07:59  Glucose-Capillary Latest Ref Range: 70 - 99 mg/dL 747 (H) 340 (H) 370 (H) 236 (H) 270 (H) 222 (H) 227 (H)    Review of Glycemic Control  Diabetes history: DM2 Outpatient Diabetes medications: Lantus 20 units daily Current orders for Inpatient glycemic control: Novolog 0-15 units Q4H  Inpatient Diabetes Program Recommendations:    Insulin: Please consider ordering Lantus 10 units daily.  Thanks, Orlando Penner, RN, MSN, CDE Diabetes Coordinator Inpatient Diabetes Program [phone] (Team Pager from 8am to 5pm)

## 2020-12-28 NOTE — TOC Initial Note (Signed)
Transition of Care [facility]) - Initial/Assessment Note    Patient Details  Name: [name] MRN: [id number] Date of Birth: 30-Mar-1951  Transition of [facility]) CM/SW Contact:    Karn Cassis, LCSW Phone Number: 12/28/2020, 3:45 PM  Clinical Narrative:  Pt admitted with acute on chronic hypoxemic and hypercarbic respiratory failure. LCSW completed assessment due to high risk readmission score. LCSW spoke with pt's daughter, [name] who reports pt lives with her husband and her and has around the clock care with family. They assist with ADLs. Pt ambulates with walker at baseline. She is on home O2 through [employer]. LCSW was contacted by [facility] about possible referral. Discussed with MD who was agreeable. LCSW also discussed with pt's daughter. However, she is not interested at this time and wants pt to return home from [facility]. [name] would like to discuss palliative services. MD notified. Palliative pending per MD. [name] states pt has had home health with [facility] and requests home health PT/OT again. Referred and accepted by [facility]. TOC will continue to follow.                Expected Discharge Plan: Home w Home Health Services Barriers to Discharge: Continued Medical Work up   Patient Goals and CMS Choice Patient states their goals for this hospitalization and ongoing recovery are:: return home with home health   Choice offered to / list presented to : Adult Children  Expected Discharge Plan and Services Expected Discharge Plan: Home w Home Health Services In-house Referral: Clinical Social Work   Post Acute Care Choice: Home Health Living arrangements for the past 2 months: Single Family Home                 DME Arranged: N/A DME Agency: NA       HH Arranged: PT, OT HH AgencyHotel manager Home Health Care Date HH Agency Contacted: 12/28/20 Time HH Agency Contacted: [phone] Representative spoke with at [facility] Agency: Kandee Keen  Prior Living Arrangements/Services Living arrangements  for the past 2 months: Single Family Home Lives with:: Spouse, Adult Children Patient language and need for interpreter reviewed:: Yes Do you feel safe going back to the place where you live?: Yes      Need for Family Participation in Patient Care: Yes (Comment) Care giver support system in place?: Yes (comment) Current home services: DME (walker, hospital bed, wheelchair, hoyer lift, home O2, 3N1) Criminal Activity/Legal Involvement Pertinent to Current Situation/Hospitalization: No - Comment as needed  Activities of Daily Living Home Assistive Devices/Equipment: Oxygen ADL Screening (condition at time of admission) Patient's cognitive ability adequate to safely complete daily activities?: No Is the patient deaf or have difficulty hearing?: No Does the patient have difficulty seeing, even when wearing glasses/contacts?: No Does the patient have difficulty concentrating, remembering, or making decisions?: Yes Patient able to express need for assistance with ADLs?: Yes Does the patient have difficulty dressing or bathing?: Yes Independently performs ADLs?: No Communication: Needs assistance Is this a change from baseline?: Change from baseline, expected to last <3 days Dressing (OT): Needs assistance Is this a change from baseline?: Change from baseline, expected to last <3days Grooming: Needs assistance Is this a change from baseline?: Change from baseline, expected to last <3 days Feeding: Dependent Is this a change from baseline?: Change from baseline, expected to last <3 days Bathing: Needs assistance Is this a change from baseline?: Change from baseline, expected to last <3 days Toileting: Needs assistance Is this a change from baseline?: Change  from baseline, expected to last <3 days In/Out Bed: Needs assistance Is this a change from baseline?: Change from baseline, expected to last <3 days Walks in Home: Needs assistance Is this a change from baseline?: Change from baseline,  expected to last <3 days Does the patient have difficulty walking or climbing stairs?: Yes Weakness of Legs: Both Weakness of Arms/Hands: Both  Permission Sought/Granted                  Emotional Assessment   Attitude/Demeanor/Rapport: Unable to Assess Affect (typically observed): Unable to Assess Orientation: : Oriented to Self Alcohol / Substance Use: Not Applicable Psych Involvement: No (comment)  Admission diagnosis:  Elevated troponin [R77.8] Endotracheal tube present [Z97.8] Acute hypoxemic respiratory failure (HCC) [J96.01] Acute on chronic respiratory failure with hypoxia and hypercapnia (HCC) [Y86.57, J96.22] Patient Active Problem List   Diagnosis Date Noted   Elevated troponin    Acute hypoxemic respiratory failure (HCC) 12/21/2020   Acute on chronic respiratory failure with hypoxia and hypercapnia (HCC)    Endotracheal tube present    Acute on chronic respiratory failure with hypoxia (HCC) 11/22/2020   Altered mental status 11/21/2020   Hypoalbuminemia due to protein-calorie malnutrition (HCC) 11/21/2020   Pressure ulcer 11/21/2020   Acute metabolic encephalopathy 08/30/2020   Acute ischemic stroke [facility])    Respiratory failure with hypoxia (HCC) 08/24/2020   Lobar pneumonia (HCC) 08/24/2020   Septic shock (HCC) 08/24/2020   NSTEMI (non-ST elevated myocardial infarction) (HCC) 08/17/2020   Constipation 03/17/2020   Knee pain 10/07/2019   Fibromyalgia 10/07/2019   Polyarthropathy 10/07/2019   Fibromyositis 09/30/2019   Abnormal gait due to muscle weakness 09/30/2019   Pain in right knee 09/30/2019   Depression 08/31/2018   Anxiety 08/31/2018   Chronic respiratory failure with hypoxia and hypercapnia (HCC) 12/13/2017   Acute systolic HF (heart failure) (HCC) 11/26/2017   Palliative care by specialist    Goals of care, counseling/discussion    Acute on chronic diastolic CHF (congestive heart failure) (HCC) 08/17/2017   Acute respiratory failure with  hypoxia and hypercapnia (HCC) 08/16/2017   Uncontrolled type 2 diabetes mellitus with hyperglycemia (HCC) 08/16/2017   Acute respiratory failure (HCC)    CAP (community acquired pneumonia) 08/05/2017   COPD exacerbation (HCC) 08/05/2017   Pulmonary hypertension (HCC) 04/05/2017   Derangement of posterior horn of medial meniscus of right knee    Lateral meniscus, anterior horn derangement, right    Syncope, near 12/26/2015   Fall at home 12/26/2015   Dysphagia 12/07/2015   IDA (iron deficiency anemia) 03/13/2015   Chronic diastolic CHF (congestive heart failure) (HCC) 02/20/2015   Diffuse abdominal pain 09/16/2014   Ascites 08/27/2014   Macrocytic anemia 07/20/2014   Epistaxis, recurrent 07/20/2014   Seizure (HCC) 04/11/2014   Dementia (HCC) 04/11/2014   Chronic diarrhea 04/11/2014   DM type 2 (diabetes mellitus, type 2) (HCC) 04/11/2014   Protein-calorie malnutrition, severe (HCC) 04/11/2014   Atrial fibrillation with RVR (HCC) 12/21/2013   Abnormal weight loss 08/26/2013   Lower abdominal pain 08/26/2013   Anorexia nervosa 08/26/2013   Encounter for therapeutic drug monitoring 08/26/2013   DOE (dyspnea on exertion) 06/29/2013   Tremor 10/27/2012   Chronic anticoagulation 06/23/2011   HYPOTENSION, ORTHOSTATIC 08/26/2009   Hyperlipidemia 03/04/2009   Barrett's esophagus 09/26/2008   Gastroparesis 09/26/2008   Iron deficiency anemia 09/25/2008   Essential hypertension 09/25/2008   Atelectasis 09/25/2008   Irritable bowel syndrome 09/25/2008   Sleep apnea 09/25/2008   CARPAL TUNNEL SYNDROME,  HX OF 09/25/2008   Hypothyroidism 04/10/2008   Body mass index (BMI) 40.0-44.9, adult (HCC) 04/10/2008   Atrial fibrillation (HCC) 04/10/2008   Acute exacerbation of CHF (congestive heart failure) (HCC) 04/10/2008   GERD (gastroesophageal reflux disease) 04/10/2008   Low back pain 04/10/2008   CHEST PAIN-PRECORDIAL 04/10/2008   Obesity 04/10/2008   PCP:  Gareth Morgan,  MD Pharmacy:   [facility] - [place], [place] - [address] [place] [address] Phone: [phone] Fax: [phone]  [id number] - [place], [place] - [address] AT [facility] [address] [place] [address] Phone: [phone] Fax: [phone]     Social Determinants of Health (SDOH) Interventions    Readmission Risk Interventions Readmission Risk Prevention Plan 12/28/2020 11/24/2020 09/21/2020  Transportation Screening Complete Complete Complete  HRI or Home Care Consult - Complete -  Social Work Consult for Recovery Care Planning/Counseling - Complete -  Palliative Care Screening - Not Applicable -  Medication Review Oceanographer) Complete Complete Complete  PCP or Specialist appointment within 3-5 days of discharge - - Complete  HRI or Home Care Consult Complete - Complete  SW Recovery Care/Counseling Consult Complete - Complete  Palliative Care Screening (No Data) - Complete  Skilled Nursing Facility Patient Refused - Complete  Some recent data might be hidden

## 2020-12-28 NOTE — Progress Notes (Signed)
PROGRESS NOTE    [name]  SEG:[id number] DOB: 09-Sep-1950 DOA: 12/21/2020 PCP: Lemmie Evens, MD   Subjective: The patient was seen and examined this morning, much more awake, alert, following commands Communicating still having significant shortness of breath at rest, worsening with exertion  Status postextubation 12/23/2020 Remained stable nasal cannula, satting 92% on 6 L of oxygen today   Brief Narrative:   [name] is a 70 y.o. female with medical history significant for obesity, type 2 diabetes, chronic atrial fibrillation on Eliquis, dementia, diastolic CHF with chronic hypoxemia on 2 L nasal cannula, hypothyroidism, seizure disorder, CKD stage III, CAD with prior NSTEMI, dyslipidemia, and GERD who presented to the ED with worsening shortness of breath over the past couple days.  She was admitted with acute on chronic hypoxemic and hypercarbic respiratory failure in the setting of CHF exacerbation.  BiPAP was initially attempted, but she could not tolerate this and therefore was intubated.  PCCM now following for ventilator management.  She is having hypotension requiring pressor use.  Assessment & Plan:   Active Problems:   Atrial fibrillation with RVR (HCC)   Acute respiratory failure with hypoxia and hypercapnia (HCC)   Acute hypoxemic respiratory failure (HCC)   Elevated troponin   Acute on chronic hypoxemic and hypercarbic respiratory failure -Extubated 12/23/2020 -Respiratory effort has been improving, has been on 9 L 4 L and now again on 6 L of oxygen via nasal cannula satting 92%  -We do not recommend reintubation due to poor prognosis -Pulmonary team agrees, patient's daughter would like to continue with a full scope of care Including reintubation, and possible tracheostomy if needed For that she needs to be transferred to [facility] following -(h/o prior intubations and tracheostomy) -Noted to have pulmonary arterial hypertension on prior CT  11/2020 -COVID testing negative; vaccinated and boosted -IV Lasix twice daily -was on hold due to hypotension... Anticipating discontinuing IV fluid, resuming Lasix if patient remained stable -Monitor in ICU   Toxic metabolic encephalopathy  -Improving-communicating, much more awake -Post extubation patient remained confused agitated somnolent...  -Daughter mental status continue to improve, somnolent this a.m. -de-escalating Precedex .... Discontinuing Precedex 6/26 -as needed Haldol   acute on chronic diastolic CHF exacerbation -Stable, limited IV fluids, will discontinue IV fluids -Recent 2D echocardiogram 5/22 with LVEF 55-60% and indeterminate diastolic parameter -Strict I's and O's -Monitor daily weights -Anticipating reinitiation of diuretics in next 1 to 2 days   Hypotension likely related to sedation Resolved Hypotensive now ... Anticipating reinitiation of BP meds slowly over next 1 to 2 days -Reducing IV fluid ... Discontinue -off Levophed -(was started on norepinephrine and PICC line placed 6/21... Discontinued 12/24/2020) Anticipated discontinuing PICC line once peripheral lines are established   Elevated troponin -Likely due to above, ischemic demand, -Denies any chest pain, A. fib with no acute changes  Chronic atrial fibrillation- -Cardiology assisting, on amiodarone p.o. now - heart rate improving -Restarted Eliquis today 12/27/2020 -Cardiology consulted, currently poor p.o. intake, if RVR develops, considering amiodarone -If H&H remained stable, restarting Eliquis  Type 2 diabetes -Recent hemoglobin A1c 7.6% -We will encourage p.o. intake, initiating CBG every 4 hours, with SSI coverage -As p.o. intake improves will adjust insulin coverage   AKI on CKD stage IIIa --Responded to IV fluids, BUN/creatinine stabilized -Responded to IV fluids,-discontinued   CAD with prior NSTEMI/dyslipidemia -Continue statin    Anemia of chronic disease -Baseline  hemoglobin near 10, transfuse for hemoglobin less than 7 -  Current hemoglobin 8.8, >> 9.4, 8.6 stable  OSA/OHS -Continue supplemental oxygen, CPAP if she tolerates   Hypothyroidism -Recent TSH 0.526 -Continue Synthroid    History of seizures -Continue Keppra, now IV... If tolerating p.o. will be switched to p.o.   History of dementia -Continue Aricept   Stage I sacral ulcer -Present on admission -Wound care   Obesity -BMI 36.64 -Lifestyle changes outpatient  Rectal bleed -Resolved, H&H remained stable -with holding Lovenox at this time Monitoring for further rectal bleed, if her H&H drops, continues to have rectal bleed, will consider consulting gastroenterology for evaluation Patient daughter and nursing staff does not report any further rectal bleeding  Hypokalemia -Repleting p.o., checking magnesium   DVT prophylaxis: resuming Eliquis today  Code Status:  Discussed with daughter at bedside 12/27/2020  ... Palliative care also has discussed patient care in detail along with Dr. Melvyn Novas.... Her daughter continues to have unrealistic expectations, such as reintubation and tracheostomy It was explained to patient daughter that once patient has tracheostomy will be very difficult to discharge her home likely will end up and LTAC Also was explained that tracheostomy would not help the underlying issue for lung and heart disease.   Family Communication: Daughter, [name] at bedside 6/26  Disposition Plan:  Status is: Inpatient  Remains inpatient appropriate because:Hemodynamically unstable, IV treatments appropriate due to intensity of illness or inability to take PO, and Inpatient level of care appropriate due to severity of illness  Dispo: The patient is from: Home              Anticipated d/c is to: SNF ???  Versus Home with home health              Patient currently is not medically stable to d/c.   Difficult to place patient No  Skin Assessment:  I have examined  the patient's skin and I agree with the wound assessment as performed by the wound care RN as outlined below:  Pressure Injury 11/21/20 Sacrum Medial Stage 1 -  Intact skin with non-blanchable redness of a localized area usually over a bony prominence. Stage 1 pressure Ulcer non blanchable (Active)  11/21/20 2259  Location: Sacrum  Location Orientation: Medial  Staging: Stage 1 -  Intact skin with non-blanchable redness of a localized area usually over a bony prominence.  Wound Description (Comments): Stage 1 pressure Ulcer non blanchable  Present on Admission: Yes    Consultants:  PCCM Palliative  Procedures:  Intubation 6/20 PICC line 6/21 See below  Antimicrobials:  Anti-infectives (From admission, onward)    Start     Dose/Rate Route Frequency Ordered Stop   12/28/20 1400  ceFAZolin (ANCEF) IVPB 1 g/50 mL premix        1 g 100 mL/hr over 30 Minutes Intravenous Every 8 hours 12/28/20 1002 12/29/20 2159   12/23/20 1400  ceFAZolin (ANCEF) IVPB 1 g/50 mL premix  Status:  Discontinued        1 g 100 mL/hr over 30 Minutes Intravenous Every 8 hours 12/23/20 1306 12/28/20 1002        Objective: Vitals:   12/28/20 0400 12/28/20 0500 12/28/20 0742 12/28/20 0800  BP: (!) 154/99 (!) 106/93  (!) 169/88  Pulse: 78 91  93  Resp: (!) '21 14  20  ' Temp:    97.6 F (36.4 C)  TempSrc:    Oral  SpO2: 95% 99% 96% 92%  Weight:      Height:  Intake/Output Summary (Last 24 hours) at 12/28/2020 1029 Last data filed at 12/28/2020 0841 Gross per 24 hour  Intake 1389.22 ml  Output --  Net 1389.22 ml   Filed Weights   12/26/20 0441 12/27/20 0410 12/28/20 0336  Weight: 109.3 kg 110.1 kg 108 kg       Physical Exam:   General:  Much more awake alert, following commands  HEENT:  Normocephalic, PERRL, otherwise with in Normal limits   Neuro:  CNII-XII intact. , normal motor and sensation, reflexes intact   Lungs:   Clear to auscultation BL, Respirations unlabored, no wheezes  / crackles  Cardio:    S1/S2, RRR, No murmure, No Rubs or Gallops   Abdomen:   Soft, non-tender, bowel sounds active all four quadrants,  no guarding or peritoneal signs.  Muscular skeletal:  Severe generalized weaknesses,  Limited exam - in bed, able to move all 4 extremities,  2+ pulses,  symmetric, No pitting edema  Skin:  Dry, warm to touch, negative for any Rashes,  Wounds: Please see nursing documentation  Pressure Injury 11/21/20 Sacrum Medial Stage 1 -  Intact skin with non-blanchable redness of a localized area usually over a bony prominence. Stage 1 pressure Ulcer non blanchable (Active)  11/21/20 2259  Location: Sacrum  Location Orientation: Medial  Staging: Stage 1 -  Intact skin with non-blanchable redness of a localized area usually over a bony prominence.  Wound Description (Comments): Stage 1 pressure Ulcer non blanchable  Present on Admission: Yes          Data Reviewed: I have personally reviewed following labs and imaging studies  CBC: Recent Labs  Lab 12/24/20 1806 12/25/20 0422 12/26/20 0441 12/27/20 0449 12/28/20 0430  WBC 6.4 5.2 5.6 7.8 8.1  HGB 8.2* 7.9* 8.1* 8.6* 8.2*  HCT 27.9* 27.1* 26.8* 29.0* 27.6*  MCV 96.2 95.4 94.0 93.9 95.2  PLT 141* 131* 156 190 431   Basic Metabolic Panel: Recent Labs  Lab 12/23/20 0340 12/25/20 0422 12/25/20 1700 12/26/20 0441 12/26/20 1604 12/27/20 0449 12/28/20 0430  NA 142 145  --  144  --  143 145  K 3.1* 3.7  --  3.4*  --  3.4* 3.1*  CL 102 107  --  107  --  106 109  CO2 30 33*  --  26  --  30 29  GLUCOSE 139* 110*  --  214*  --  218* 221*  BUN 22 23  --  23  --  18 14  CREATININE 1.15* 0.86  --  0.94  --  0.95 0.91  CALCIUM 8.1* 8.1*  --  8.3*  --  8.6* 8.5*  MG 1.9 2.0 2.0 2.1 2.2  --   --   PHOS  --  3.8 3.7 3.3 2.7  --   --    GFR: Estimated Creatinine Clearance: 70.5 mL/min (by C-G formula based on SCr of 0.91 mg/dL). Liver Function Tests:  Recent Labs  Lab 12/27/20 1651 12/27/20 2049  12/27/20 2333 12/28/20 0324 12/28/20 0759  GLUCAP 214* 236* 270* 222* 227*   Lipid Profile: Recent Labs    12/28/20 0429  TRIG 60   Thyroid Function Tests: No results for input(s): TSH, T4TOTAL, FREET4, T3FREE, THYROIDAB in the last 72 hours. Anemia Panel: No results for input(s): VITAMINB12, FOLATE, FERRITIN, TIBC, IRON, RETICCTPCT in the last 72 hours. Sepsis Labs: No results for input(s): PROCALCITON, LATICACIDVEN in the last 168 hours.  Recent Results (from the past 240 hour(s))  MRSA  Next Gen by PCR, Nasal     Status: None   Collection Time: 12/21/20  9:20 AM   Specimen: Nasal Mucosa; Nasal Swab  Result Value Ref Range Status   MRSA by PCR Next Gen NOT DETECTED NOT DETECTED Final    Comment: (NOTE) The GeneXpert MRSA Assay (FDA approved for NASAL specimens only), is one component of a comprehensive MRSA colonization surveillance program. It is not intended to diagnose MRSA infection nor to guide or monitor treatment for MRSA infections. Test performance is not FDA approved in patients less than 72 years old. Performed at [facility], [address], [place], [place] [address]   Resp Panel by RT-PCR (Flu A&B, Covid) Nasopharyngeal Swab     Status: None   Collection Time: 12/21/20 11:45 AM   Specimen: Nasopharyngeal Swab; Nasopharyngeal(NP) swabs in vial transport medium  Result Value Ref Range Status   SARS Coronavirus 2 by RT PCR NEGATIVE NEGATIVE Final    Comment: (NOTE) SARS-CoV-2 target nucleic acids are NOT DETECTED.  The SARS-CoV-2 RNA is generally detectable in upper respiratory specimens during the acute phase of infection. The lowest concentration of SARS-CoV-2 viral copies this assay can detect is 138 copies/mL. A negative result does not preclude SARS-Cov-2 infection and should not be used as the sole basis for treatment or other patient management decisions. A negative result may occur with  improper specimen collection/handling, submission of specimen  other than nasopharyngeal swab, presence of viral mutation(s) within the areas targeted by this assay, and inadequate number of viral copies(<138 copies/mL). A negative result must be combined with clinical observations, patient history, and epidemiological information. The expected result is Negative.  Fact Sheet for Patients:  EntrepreneurPulse.com.au  Fact Sheet for Healthcare Providers:  IncredibleEmployment.be  This test is no t yet approved or cleared by the [place] FDA and  has been authorized for detection and/or diagnosis of SARS-CoV-2 by FDA under an Emergency Use Authorization (EUA). This EUA will remain  in effect (meaning this test can be used) for the duration of the COVID-19 declaration under Section 564(b)(1) of the Act, 21 U.S.C.section 360bbb-3(b)(1), unless the authorization is terminated  or revoked sooner.       Influenza A by PCR NEGATIVE NEGATIVE Final   Influenza B by PCR NEGATIVE NEGATIVE Final    Comment: (NOTE) The Xpert Xpress SARS-CoV-2/FLU/RSV plus assay is intended as an aid in the diagnosis of influenza from Nasopharyngeal swab specimens and should not be used as a sole basis for treatment. Nasal washings and aspirates are unacceptable for Xpert Xpress SARS-CoV-2/FLU/RSV testing.  Fact Sheet for Patients: EntrepreneurPulse.com.au  Fact Sheet for Healthcare Providers: IncredibleEmployment.be  This test is not yet approved or cleared by the [place] FDA and has been authorized for detection and/or diagnosis of SARS-CoV-2 by FDA under an Emergency Use Authorization (EUA). This EUA will remain in effect (meaning this test can be used) for the duration of the COVID-19 declaration under Section 564(b)(1) of the Act, 21 U.S.C. section 360bbb-3(b)(1), unless the authorization is terminated or revoked.  Performed at [facility], [address] [place], [place]  [address]   Culture, Urine     Status: Abnormal   Collection Time: 12/21/20  4:43 PM   Specimen: Urine, Random  Result Value Ref Range Status   Specimen Description   Final    URINE, RANDOM Performed at [facility], [address], [place], [place] [address]    Special Requests   Final    NONE Performed  at [facility], [address], [place], [place] [address]    Culture >=100,000 COLONIES/mL ESCHERICHIA COLI (A)  Final   Report Status 12/24/2020 FINAL  Final   Organism ID, Bacteria ESCHERICHIA COLI (A)  Final      Susceptibility   Escherichia coli - MIC*    AMPICILLIN <=2 SENSITIVE Sensitive     CEFAZOLIN <=4 SENSITIVE Sensitive     CEFEPIME <=0.12 SENSITIVE Sensitive     CEFTRIAXONE <=0.25 SENSITIVE Sensitive     CIPROFLOXACIN <=0.25 SENSITIVE Sensitive     GENTAMICIN <=1 SENSITIVE Sensitive     IMIPENEM <=0.25 SENSITIVE Sensitive     NITROFURANTOIN <=16 SENSITIVE Sensitive     TRIMETH/SULFA <=20 SENSITIVE Sensitive     AMPICILLIN/SULBACTAM <=2 SENSITIVE Sensitive     PIP/TAZO <=4 SENSITIVE Sensitive     * >=100,000 COLONIES/mL ESCHERICHIA COLI         Radiology Studies: No results found.      Scheduled Meds:  amiodarone  200 mg Oral q morning   apixaban  5 mg Oral BID   atorvastatin  80 mg Oral Daily   chlorhexidine gluconate (MEDLINE KIT)  15 mL Mouth Rinse BID   Chlorhexidine Gluconate Cloth  6 each Topical Daily   cholecalciferol  2,000 Units Oral Daily   docusate  100 mg Oral BID   donepezil  5 mg Oral Daily   DULoxetine  40 mg Oral BID   guaiFENesin-dextromethorphan  10 mL Oral Q8H   insulin aspart  0-15 Units Subcutaneous Q4H   ipratropium  0.5 mg Nebulization BID   levalbuterol  0.63 mg Nebulization BID   levETIRAcetam  1,000 mg Oral BID   levothyroxine  175 mcg Oral Daily   mouth rinse  15 mL Mouth Rinse BID   melatonin  3 mg Oral QHS   multivitamin  15 mL Oral Daily   pantoprazole  40 mg Oral Daily   polyethylene glycol  17 g Per Tube  Daily   potassium chloride  20 mEq Oral Daily   sodium chloride flush  10-40 mL Intracatheter Q12H   sodium chloride flush  3 mL Intravenous Q12H   Continuous Infusions:  sodium chloride Stopped (12/24/20 0854)   sodium chloride Stopped (12/28/20 0816)    ceFAZolin (ANCEF) IV       LOS: 7 days    Time spent: 55 minutes of critical time was spent seeing evaluating patient, discussing care with consultants, palliative care, family meeting, planning current medical care... Adjusting medications,    Deatra James, MD [facility]  If 7PM-7AM, please contact night-coverage [email or web address] 12/28/2020, 10:29 AM

## 2020-12-29 LAB — CBC
HCT: 29.1 % — ABNORMAL LOW (ref 36.0–46.0)
Hemoglobin: 8.4 g/dL — ABNORMAL LOW (ref 12.0–15.0)
MCH: 27.6 pg (ref 26.0–34.0)
MCHC: 28.9 g/dL — ABNORMAL LOW (ref 30.0–36.0)
MCV: 95.7 fL (ref 80.0–100.0)
Platelets: 203 10*3/uL (ref 150–400)
RBC: 3.04 MIL/uL — ABNORMAL LOW (ref 3.87–5.11)
RDW: 16.7 % — ABNORMAL HIGH (ref 11.5–15.5)
WBC: 8.9 10*3/uL (ref 4.0–10.5)
nRBC: 0.3 % — ABNORMAL HIGH (ref 0.0–0.2)

## 2020-12-29 LAB — COMPREHENSIVE METABOLIC PANEL
ALT: 7 U/L (ref 0–44)
AST: 23 U/L (ref 15–41)
Albumin: 2.9 g/dL — ABNORMAL LOW (ref 3.5–5.0)
Alkaline Phosphatase: 86 U/L (ref 38–126)
Anion gap: 7 (ref 5–15)
BUN: 13 mg/dL (ref 8–23)
CO2: 29 mmol/L (ref 22–32)
Calcium: 8.8 mg/dL — ABNORMAL LOW (ref 8.9–10.3)
Chloride: 108 mmol/L (ref 98–111)
Creatinine, Ser: 0.79 mg/dL (ref 0.44–1.00)
GFR, Estimated: >60 mL/min (ref >60)
Glucose, Bld: 198 mg/dL — ABNORMAL HIGH (ref 70–99)
Potassium: 3.3 mmol/L — ABNORMAL LOW (ref 3.5–5.1)
Sodium: 144 mmol/L (ref 135–145)
Total Bilirubin: 0.7 mg/dL (ref 0.3–1.2)
Total Protein: 5.7 g/dL — ABNORMAL LOW (ref 6.5–8.1)

## 2020-12-29 LAB — GLUCOSE, CAPILLARY
Glucose-Capillary: 115 mg/dL — ABNORMAL HIGH (ref 70–99)
Glucose-Capillary: 146 mg/dL — ABNORMAL HIGH (ref 70–99)
Glucose-Capillary: 155 mg/dL — ABNORMAL HIGH (ref 70–99)
Glucose-Capillary: 162 mg/dL — ABNORMAL HIGH (ref 70–99)
Glucose-Capillary: 162 mg/dL — ABNORMAL HIGH (ref 70–99)
Glucose-Capillary: 206 mg/dL — ABNORMAL HIGH (ref 70–99)

## 2020-12-29 LAB — MAGNESIUM: Magnesium: 2.2 mg/dL (ref 1.7–2.4)

## 2020-12-29 MED ORDER — POTASSIUM CHLORIDE 20 MEQ PO PACK
40.0000 meq | PACK | Freq: Every day | ORAL | Status: DC
  Administered 2020-12-29 – 2020-12-30 (×2): 40 meq via ORAL
  Filled 2020-12-29: qty 2

## 2020-12-29 MED ORDER — CHLORHEXIDINE GLUCONATE CLOTH 2 % EX PADS
6.0000 | MEDICATED_PAD | Freq: Every day | CUTANEOUS | Status: DC
  Administered 2020-12-29 – 2020-12-30 (×2): 6 via TOPICAL

## 2020-12-29 MED ORDER — RISAQUAD PO CAPS
1.0000 | ORAL_CAPSULE | Freq: Three times a day (TID) | ORAL | Status: DC
  Administered 2020-12-29 – 2020-12-30 (×4): 1 via ORAL
  Filled 2020-12-29 (×5): qty 1

## 2020-12-29 NOTE — Progress Notes (Signed)
PROGRESS NOTE    [name]  TKZ:[id number] DOB: [birth year] DOA: 12/21/2020 PCP: Gareth Morgan, MD   Subjective: T the patient was seen examined, stable this a.m. O2 demand has increased from 4 to 7 L, satting 96%  Status postextubation 12/23/2020 Remained stable nasal cannula, satting 92% on 6 L of oxygen today   Brief Narrative:   [name] is a 70 y.o. female with medical history significant for obesity, type 2 diabetes, chronic atrial fibrillation on Eliquis, dementia, diastolic CHF with chronic hypoxemia on 2 L nasal cannula, hypothyroidism, seizure disorder, CKD stage III, CAD with prior NSTEMI, dyslipidemia, and GERD who presented to the ED with worsening shortness of breath over the past couple days.  She was admitted with acute on chronic hypoxemic and hypercarbic respiratory failure in the setting of CHF exacerbation.  BiPAP was initially attempted, but she could not tolerate this and therefore was intubated.  PCCM now following for ventilator management.  She is having hypotension requiring pressor use.  Assessment & Plan:   Active Problems:   Atrial fibrillation with RVR (HCC)   Acute respiratory failure with hypoxia and hypercapnia (HCC)   Acute hypoxemic respiratory failure (HCC)   Elevated troponin   Acute on chronic hypoxemic and hypercarbic respiratory failure -Extubated 12/23/2020 --O2 demand has increased from 4 to 7 L, satting 96% this morning  -We do not recommend reintubation due to poor prognosis -Pulmonary team agrees, patient's daughter would like to continue with a full scope of care Including reintubation, and possible tracheostomy if needed For that she needs to be transferred to [facility]   -PCCM following -(h/o prior intubations and tracheostomy) -Noted to have pulmonary arterial hypertension on prior CT 11/2020 -COVID testing negative; vaccinated and boosted -IV Lasix twice daily -was on hold due to hypotension... Anticipating  discontinuing IV fluid, resuming Lasix if patient remained stable -Monitor in ICU   Toxic metabolic encephalopathy  -Much improved, communicating now -Post extubation patient remained confused agitated somnolent...  -Daughter mental status continue to improve, somnolent this a.m. -de-escalating Precedex .... Discontinuing Precedex 6/26 -as needed Haldol   acute on chronic diastolic CHF exacerbation -Stable, limited IV fluids, will discontinue IV fluids -Recent 2D echocardiogram 5/22 with LVEF 55-60% and indeterminate diastolic parameter -Strict I's and O's -Monitor daily weights -Anticipating reinitiation of diuretics in next 1 to 2 days   Hypotension likely related to sedation Resolved Hypotensive now ... Anticipating reinitiation of BP meds slowly over next 1 to 2 days -Reducing IV fluid ... Discontinue  -- off Levophed -(was started on norepinephrine and PICC line placed 6/21... Discontinued 12/24/2020) Anticipated discontinuing PICC line once peripheral lines are established   Elevated troponin -Likely due to above, ischemic demand, -Denies any chest pain, A. fib with no acute changes  Chronic atrial fibrillation- -Cardiology assisting, on amiodarone p.o. now - heart rate improving -Restarted Eliquis today 12/27/2020 -Cardiology consulted, currently poor p.o. intake, if RVR develops, considering amiodarone -If H&H remained stable, restarting Eliquis  Type 2 diabetes -Recent hemoglobin A1c 7.6% -We will encourage p.o. intake, initiating CBG every 4 hours, with SSI coverage -As p.o. intake improves will adjust insulin coverage   AKI on CKD stage IIIa --Responded to IV fluids, BUN/creatinine stabilized -Responded to IV fluids,-discontinued   CAD with prior NSTEMI/dyslipidemia -Continue statin    Anemia of chronic disease -Baseline hemoglobin near 10, transfuse for hemoglobin less than 7 -Current hemoglobin 8.8, >> 9.4, 8.6 stable  OSA/OHS -Continue supplemental  oxygen, CPAP if she tolerates  Hypothyroidism -Recent TSH 0.526 -Continue Synthroid    History of seizures -Continue Keppra, now IV... Changing to p.o.    History of dementia -Continue Aricept   Stage I sacral ulcer -Present on admission -Wound care   Obesity -BMI 36.64 -Lifestyle changes outpatient  Rectal bleed -Resolved, H&H remained stable -with holding Lovenox at this time Monitoring for further rectal bleed, if her H&H drops, continues to have rectal bleed, will consider consulting gastroenterology for evaluation Patient daughter and nursing staff does not report any further rectal bleeding  Hypokalemia -Repleting p.o., checking magnesium   DVT prophylaxis: resuming Eliquis today  Code Status:  Discussed with daughter at bedside 12/29/2020  ... Palliative care also has discussed patient care in detail along with Dr. Sherene Sires.... Her daughter continues to have unrealistic expectations, such as reintubation and tracheostomy  Daughter has refused LTAC, palliative care, hospice Planning to take the patient home with her likely 12/30/2020 with home health   Family Communication: Daughter, [name] at bedside 6/26  Disposition Plan:  Status is: Inpatient  Remains inpatient appropriate because:Hemodynamically unstable, IV treatments appropriate due to intensity of illness or inability to take PO, and Inpatient level of care appropriate due to severity of illness  Dispo: The patient is from: Home              Anticipated d/c is to: Home with home health in 1 day               Patient currently is not medically stable to d/c.   Difficult to place patient No  Skin Assessment:  I have examined the patient's skin and I agree with the wound assessment as performed by the wound care RN as outlined below:  Pressure Injury 11/21/20 Sacrum Medial Stage 1 -  Intact skin with non-blanchable redness of a localized area usually over a bony prominence. Stage 1 pressure Ulcer non  blanchable (Active)  11/21/20 2259  Location: Sacrum  Location Orientation: Medial  Staging: Stage 1 -  Intact skin with non-blanchable redness of a localized area usually over a bony prominence.  Wound Description (Comments): Stage 1 pressure Ulcer non blanchable  Present on Admission: Yes    Consultants:  PCCM Palliative  Procedures:  Intubation 6/20 PICC line 6/21 See below  Antimicrobials:  Anti-infectives (From admission, onward)    Start     Dose/Rate Route Frequency Ordered Stop   12/28/20 1400  ceFAZolin (ANCEF) IVPB 1 g/50 mL premix        1 g 100 mL/hr over 30 Minutes Intravenous Every 8 hours 12/28/20 1002 12/29/20 2159   12/23/20 1400  ceFAZolin (ANCEF) IVPB 1 g/50 mL premix  Status:  Discontinued        1 g 100 mL/hr over 30 Minutes Intravenous Every 8 hours 12/23/20 1306 12/28/20 1002        Objective: Vitals:   12/28/20 2048 12/28/20 2325 12/29/20 0355 12/29/20 0722  BP:  (!) 150/94 (!) 146/91   Pulse:  95 (!) 106   Resp:  18 20   Temp:  98 F (36.7 C) 98.6 F (37 C)   TempSrc:  Oral    SpO2: 97% 100% 96% 96%  Weight:   108.4 kg   Height:        Intake/Output Summary (Last 24 hours) at 12/29/2020 1255 Last data filed at 12/29/2020 0900 Gross per 24 hour  Intake 700 ml  Output 400 ml  Net 300 ml   Filed Weights   12/27/20 0410 12/28/20  0240 12/29/20 0355  Weight: 110.1 kg 108 kg 108.4 kg         Physical Exam:   General:  Alert, oriented, cooperative, no distress;   HEENT:  Normocephalic, PERRL, otherwise with in Normal limits   Neuro:  CNII-XII intact. , normal motor and sensation, reflexes intact   Lungs:   Clear to auscultation BL, Respirations unlabored, no wheezes / crackles  Cardio:    S1/S2, RRR, No murmure, No Rubs or Gallops   Abdomen:   Soft, non-tender, bowel sounds active all four quadrants,  no guarding or peritoneal signs.  Muscular skeletal:  Severe generalized weaknesses, Limited exam - in bed, able to move all 4  extremities, Normal strength,  2+ pulses,  symmetric, No pitting edema  Skin:  Dry, warm to touch, negative for any Rashes,  Wounds: Please see nursing documentation  Pressure Injury 11/21/20 Sacrum Medial Stage 1 -  Intact skin with non-blanchable redness of a localized area usually over a bony prominence. Stage 1 pressure Ulcer non blanchable (Active)  11/21/20 2259  Location: Sacrum  Location Orientation: Medial  Staging: Stage 1 -  Intact skin with non-blanchable redness of a localized area usually over a bony prominence.  Wound Description (Comments): Stage 1 pressure Ulcer non blanchable  Present on Admission: Yes              Data Reviewed: I have personally reviewed following labs and imaging studies  CBC: Recent Labs  Lab 12/25/20 0422 12/26/20 0441 12/27/20 0449 12/28/20 0430 12/29/20 0442  WBC 5.2 5.6 7.8 8.1 8.9  HGB 7.9* 8.1* 8.6* 8.2* 8.4*  HCT 27.1* 26.8* 29.0* 27.6* 29.1*  MCV 95.4 94.0 93.9 95.2 95.7  PLT 131* 156 190 181 203   Basic Metabolic Panel: Recent Labs  Lab 12/25/20 0422 12/25/20 1700 12/26/20 0441 12/26/20 1604 12/27/20 0449 12/28/20 0430 12/29/20 0442  NA 145  --  144  --  143 145 144  K 3.7  --  3.4*  --  3.4* 3.1* 3.3*  CL 107  --  107  --  106 109 108  CO2 33*  --  26  --  30 29 29   GLUCOSE 110*  --  214*  --  218* 221* 198*  BUN 23  --  23  --  18 14 13   CREATININE 0.86  --  0.94  --  0.95 0.91 0.79  CALCIUM 8.1*  --  8.3*  --  8.6* 8.5* 8.8*  MG 2.0 2.0 2.1 2.2  --   --  2.2  PHOS 3.8 3.7 3.3 2.7  --   --   --    GFR: Estimated Creatinine Clearance: 80.3 mL/min (by C-G formula based on SCr of 0.79 mg/dL). Liver Function Tests:  Recent Labs  Lab 12/28/20 2107 12/28/20 2359 12/29/20 0356 12/29/20 0728 12/29/20 1103  GLUCAP 123* 162* 206* 146* 162*   Lipid Profile: Recent Labs    12/28/20 0429  TRIG 60   Thyroid Function Tests: No results for input(s): TSH, T4TOTAL, FREET4, T3FREE, THYROIDAB in the last 72  hours. Anemia Panel: No results for input(s): VITAMINB12, FOLATE, FERRITIN, TIBC, IRON, RETICCTPCT in the last 72 hours. Sepsis Labs: No results for input(s): PROCALCITON, LATICACIDVEN in the last 168 hours.  Recent Results (from the past 240 hour(s))  MRSA Next Gen by PCR, Nasal     Status: None   Collection Time: 12/21/20  9:20 AM   Specimen: Nasal Mucosa; Nasal Swab  Result Value Ref Range  Status   MRSA by PCR Next Gen NOT DETECTED NOT DETECTED Final    Comment: (NOTE) The GeneXpert MRSA Assay (FDA approved for NASAL specimens only), is one component of a comprehensive MRSA colonization surveillance program. It is not intended to diagnose MRSA infection nor to guide or monitor treatment for MRSA infections. Test performance is not FDA approved in patients less than 66 years old. Performed at [facility], [address] [place], [place] [address]   Resp Panel by RT-PCR (Flu A&B, Covid) Nasopharyngeal Swab     Status: None   Collection Time: 12/21/20 11:45 AM   Specimen: Nasopharyngeal Swab; Nasopharyngeal(NP) swabs in vial transport medium  Result Value Ref Range Status   SARS Coronavirus 2 by RT PCR NEGATIVE NEGATIVE Final    Comment: (NOTE) SARS-CoV-2 target nucleic acids are NOT DETECTED.  The SARS-CoV-2 RNA is generally detectable in upper respiratory specimens during the acute phase of infection. The lowest concentration of SARS-CoV-2 viral copies this assay can detect is 138 copies/mL. A negative result does not preclude SARS-Cov-2 infection and should not be used as the sole basis for treatment or other patient management decisions. A negative result may occur with  improper specimen collection/handling, submission of specimen other than nasopharyngeal swab, presence of viral mutation(s) within the areas targeted by this assay, and inadequate number of viral copies(<138 copies/mL). A negative result must be combined with clinical observations, patient history, and  epidemiological information. The expected result is Negative.  Fact Sheet for Patients:  BloggerCourse.com  Fact Sheet for Healthcare Providers:  SeriousBroker.it  This test is no t yet approved or cleared by the [place] FDA and  has been authorized for detection and/or diagnosis of SARS-CoV-2 by FDA under an Emergency Use Authorization (EUA). This EUA will remain  in effect (meaning this test can be used) for the duration of the COVID-19 declaration under Section 564(b)(1) of the Act, 21 U.S.C.section 360bbb-3(b)(1), unless the authorization is terminated  or revoked sooner.       Influenza A by PCR NEGATIVE NEGATIVE Final   Influenza B by PCR NEGATIVE NEGATIVE Final    Comment: (NOTE) The Xpert Xpress SARS-CoV-2/FLU/RSV plus assay is intended as an aid in the diagnosis of influenza from Nasopharyngeal swab specimens and should not be used as a sole basis for treatment. Nasal washings and aspirates are unacceptable for Xpert Xpress SARS-CoV-2/FLU/RSV testing.  Fact Sheet for Patients: BloggerCourse.com  Fact Sheet for Healthcare Providers: SeriousBroker.it  This test is not yet approved or cleared by the [place] FDA and has been authorized for detection and/or diagnosis of SARS-CoV-2 by FDA under an Emergency Use Authorization (EUA). This EUA will remain in effect (meaning this test can be used) for the duration of the COVID-19 declaration under Section 564(b)(1) of the Act, 21 U.S.C. section 360bbb-3(b)(1), unless the authorization is terminated or revoked.  Performed at [facility], [address] [place], [place] [address]   Culture, Urine     Status: Abnormal   Collection Time: 12/21/20  4:43 PM   Specimen: Urine, Random  Result Value Ref Range Status   Specimen Description   Final    URINE, RANDOM Performed at [facility], [address],  [place], [place] [address]    Special Requests   Final    NONE Performed at [facility], [address], [place], [place] [address]    Culture >=100,000 COLONIES/mL ESCHERICHIA COLI (A)  Final   Report Status 12/24/2020 FINAL  Final  Organism ID, Bacteria ESCHERICHIA COLI (A)  Final      Susceptibility   Escherichia coli - MIC*    AMPICILLIN <=2 SENSITIVE Sensitive     CEFAZOLIN <=4 SENSITIVE Sensitive     CEFEPIME <=0.12 SENSITIVE Sensitive     CEFTRIAXONE <=0.25 SENSITIVE Sensitive     CIPROFLOXACIN <=0.25 SENSITIVE Sensitive     GENTAMICIN <=1 SENSITIVE Sensitive     IMIPENEM <=0.25 SENSITIVE Sensitive     NITROFURANTOIN <=16 SENSITIVE Sensitive     TRIMETH/SULFA <=20 SENSITIVE Sensitive     AMPICILLIN/SULBACTAM <=2 SENSITIVE Sensitive     PIP/TAZO <=4 SENSITIVE Sensitive     * >=100,000 COLONIES/mL ESCHERICHIA COLI         Radiology Studies: No results found.      Scheduled Meds:  acidophilus  1 capsule Oral TID WC   amiodarone  200 mg Oral q morning   apixaban  5 mg Oral BID   atorvastatin  80 mg Oral Daily   Chlorhexidine Gluconate Cloth  6 each Topical Daily   cholecalciferol  2,000 Units Oral Daily   docusate  100 mg Oral BID   donepezil  5 mg Oral Daily   DULoxetine  40 mg Oral BID   furosemide  20 mg Oral Daily   guaiFENesin-dextromethorphan  10 mL Oral Q8H   insulin aspart  0-15 Units Subcutaneous Q4H   insulin glargine  10 Units Subcutaneous Daily   ipratropium  0.5 mg Nebulization BID   levalbuterol  0.63 mg Nebulization BID   levETIRAcetam  1,000 mg Oral BID   levothyroxine  175 mcg Oral Daily   mouth rinse  15 mL Mouth Rinse BID   melatonin  3 mg Oral QHS   multivitamin  15 mL Oral Daily   pantoprazole  40 mg Oral Daily   polyethylene glycol  17 g Per Tube Daily   potassium chloride  40 mEq Oral Daily   sodium chloride flush  10-40 mL Intracatheter Q12H   sodium chloride flush  3 mL Intravenous Q12H   Continuous Infusions:  sodium chloride  Stopped (12/24/20 0854)    ceFAZolin (ANCEF) IV 1 g (12/29/20 0635)     LOS: 8 days    Time spent: 55 minutes of critical time was spent seeing evaluating patient, discussing care with consultants, palliative care, family meeting, planning current medical care... Adjusting medications,    Kendell BaneSeyed A Fancy Dunkley, MD [facility]  If 7PM-7AM, please contact night-coverage [email or web address] 12/29/2020, 12:55 PM

## 2020-12-29 NOTE — Progress Notes (Signed)
Palliative: [name] is being assisted to toilet with bedside nursing staff.  Her daughter, [name], and I talk outside the room.  [name] tells me that she is open to outpatient palliative services with hospice of [facility].  We talked in detail about what is and is not provided with outpatient palliative services and outpatient hospice services.  At this point patient and family are requesting outpatient palliative services.  [name] also talks about continuing home health.  I reassure her that the transition of care team are working to meet these needs.  Conference with attending and transition of care team related to patient condition, needs, goals of care, disposition.  Plan: Continue full scope/full code.  Rehospitalize as needed.  Outpatient palliative services with [facility].  Rehospitalize as needed.  25 minutes Lillia Carmel, NP Palliative medicine team Team phone [phone] Greater than 50% of this time was spent counseling and coordinating care related to the above assessment and plan.

## 2020-12-29 NOTE — TOC Progression Note (Signed)
Transition of Care [facility]) - Progression Note    Patient Details  Name: [name] MRN: [id number] Date of Birth: 1950/10/12  Transition of Care [facility] CM/SW Contact  Leitha Bleak, RN Phone Number: 12/29/2020, 12:31 PM  Clinical Narrative:   Palliative consulted TOC to refer to outpatient palliative.  TOC spoke with [name], she requested [facility], referral sent to [place].   Expected Discharge Plan: Home w Home Health Services Barriers to Discharge: Continued Medical Work up  Expected Discharge Plan and Services Expected Discharge Plan: Home w Home Health Services In-house Referral: Clinical Social Work   Post Acute Care Choice: Home Health Living arrangements for the past 2 months: Single Family Home                 DME Arranged: N/A DME Agency: NA       HH Arranged: PT, OT HH AgencyHotel manager Home Health Care Date HH Agency Contacted: 12/28/20 Time HH Agency Contacted: 1544 Representative spoke with at [facility] Agency: Kandee Keen   Social Determinants of Health (SDOH) Interventions    Readmission Risk Interventions Readmission Risk Prevention Plan 12/28/2020 11/24/2020 09/21/2020  Transportation Screening Complete Complete Complete  HRI or Home Care Consult - Complete -  Social Work Consult for Recovery Care Planning/Counseling - Complete -  Palliative Care Screening - Not Applicable -  Medication Review Oceanographer) Complete Complete Complete  PCP or Specialist appointment within 3-5 days of discharge - - Complete  HRI or Home Care Consult Complete - Complete  SW Recovery Care/Counseling Consult Complete - Complete  Palliative Care Screening (No Data) - Complete  Skilled Nursing Facility Patient Refused - Complete  Some recent data might be hidden

## 2020-12-29 NOTE — Progress Notes (Signed)
    Telemetry reviewed and HR remains well-controlled in the 80's to 90's, peaking into the low-100's. Occasional PVC's. Keep Mg ~ 2.0 and K+ ~ 4.0. Continue current cardiac medications including Amiodarone 200mg  daily and Eliquis 5mg  BID.   Signed, , PA-C 12/29/2020, 9:53 AM Pager: [phone]

## 2020-12-29 NOTE — Progress Notes (Signed)
NAME:  [name], MRN:  [id number], DOB:  [birth year], LOS: 8 ADMISSION DATE:  12/21/2020, CONSULTATION DATE:  6/20 REFERRING MD:  Manuella Ghazi, [facility], CHIEF COMPLAINT:  sob   History of Present Illness:  60  yowf quit smoking 1984 (s airflow obst a recently as 12/05/17 pft) with medical history significant for obesity, type 2 diabetes, chronic atrial fibrillation on Eliquis, dementia, diastolic CHF with chronic hypoxemia on 2 L nasal cannula, hypothyroidism, seizure disorder, CKD stage III, CAD with prior NSTEMI, dyslipidemia, and GERD who presented to the ED with worsening shortness of breath over the past couple days.  Daughter at bedside states that she has had approximately 20 pound weight gain noted as well over the same time frame.  Her symptoms were worsening and EMS was called and upon arrival she was noted to have pulse oximetry of 55%.  She was noted to be taking her medications normally according to the daughter and was wearing her home oxygen.  She denied any chest pain, cough, fevers, or chills.  Some mild lower extremity edema has been noted as well.  No other sick contacts and she has had her COVID vaccines and booster shots.  She appears to have been admitted just last month with acute on chronic diastolic CHF.  Her discharge weight at that time was 214 pounds.  She is currently at 227 pounds.   ED Course: Vital signs with some mild tachycardia noted with atrial fibrillation on EKG and ABG with PCO2 68.2 and PO2 of 71.5.  Troponin of 113.  BNP 453.  Chest x-ray with some cardiomegaly and pulmonary vascular congestion noted.  COVID testing pending.  She has been started on some Solu-Medrol as well as Lasix twice daily.  She is currently on BiPAP 40% FiO2.   Baseline per daughter = mostly housbound on 2lpm 24/7 with baseline dementia but AMS worse than usual x several weeks assoc with wt gain, no cough or fever or cp / most the swelling is in her abd not legs    Pertinent  Medical History      Significant Hospital Events: Including procedures, antibiotic start and stop dates in addition to other pertinent events   MRSA  PCR 6/20 neg Resp viral panel PCR  6/20 neg Urine culture  6/20  Ecoli > 100k pan sensitive ET 6/20  > 6/22  PIC  RUE 6/21 >>> ESR 6/22 >>> Ancef 6/22 >>>   Scheduled Meds:  acidophilus  1 capsule Oral TID WC   amiodarone  200 mg Oral q morning   apixaban  5 mg Oral BID   atorvastatin  80 mg Oral Daily   Chlorhexidine Gluconate Cloth  6 each Topical Daily   cholecalciferol  2,000 Units Oral Daily   docusate  100 mg Oral BID   donepezil  5 mg Oral Daily   DULoxetine  40 mg Oral BID   furosemide  20 mg Oral Daily   guaiFENesin-dextromethorphan  10 mL Oral Q8H   insulin aspart  0-15 Units Subcutaneous Q4H   insulin glargine  10 Units Subcutaneous Daily   ipratropium  0.5 mg Nebulization BID   levalbuterol  0.63 mg Nebulization BID   levETIRAcetam  1,000 mg Oral BID   levothyroxine  175 mcg Oral Daily   mouth rinse  15 mL Mouth Rinse BID   melatonin  3 mg Oral QHS   multivitamin  15 mL Oral Daily   pantoprazole  40 mg Oral Daily   polyethylene glycol  17 g Per Tube Daily   potassium chloride  40 mEq Oral Daily   sodium chloride flush  10-40 mL Intracatheter Q12H   sodium chloride flush  3 mL Intravenous Q12H   Continuous Infusions:  sodium chloride Stopped (12/24/20 0854)    ceFAZolin (ANCEF) IV 1 g (12/29/20 0635)   PRN Meds:.sodium chloride, acetaminophen **OR** acetaminophen, albuterol, haloperidol lactate, loperamide, ondansetron **OR** ondansetron (ZOFRAN) IV, sodium chloride flush, sodium chloride flush    Interim History / Subjective:  On floor sitting in recliner but actually in a bed position at 30 degrees hob  Voluntary cough is day   Objective   Blood pressure (!) 146/91, pulse (!) 106, temperature 98.6 F (37 C), resp. rate 20, height '5\' 6"'  (1.676 m), weight 108.4 kg, SpO2 96 %.        Intake/Output Summary (Last 24 hours)  at 12/29/2020 1406 Last data filed at 12/29/2020 0900 Gross per 24 hour  Intake 700 ml  Output 400 ml  Net 300 ml   Filed Weights   12/27/20 0410 12/28/20 0336 12/29/20 0355  Weight: 110.1 kg 108 kg 108.4 kg    Examination:  Tmax  98.6 General appearance:    Elderly wf, nad    At Rest 02 sats  96% on 7lpm (orders to titrate sats < 94% )  No jvd Oropharynx clear,  mucosa nl Neck supple Lungs with distant bs bilaterally RRR no s3 or or sign murmur Abd obese with minimal excursion  Extr warm with no edema or clubbing noted Neuro  Sensorium intact,  no apparent motor deficits      Assessment & Plan:  Acute on chronic hypercarbic and hypoxemic resp failure with biventricular chf but only mild pulmonary edema on cxr suggesting restrictive changes  (documented by pfts 12/05/17 s obstructive component so this is not copd)  or chf causing decompensation (she has massive CM, kyphosis, and obesity contributing to restriction and none of these components are reversible in the foreseeable future) >>>  very limited options here given pt is not a good candidate at all for NIV and would likely need to be sedated to tolerate any kind of mask >>>  > full code for now  with reasoning that she survived vent/trach previously  >>> goal is sats low 90s so as not to interfere with resp drive  >>> add IS now that more awake, optimize mobility   AMS c/w acute deliruium likely related to acute on chronic hypercabia/ C02 narcosis  >>>  titrate 02 to low 90s's to satisfy cns needs s increase pC02 from depressing resp drive at higher sats/ reviewed with daughter   Underlying dementia very challenging in terms of addressing her long term outlook and for hypercabia as I strongly doubt she would be compliant with NIV  Relative hypovolemia resolved      CAF with  ok ventricular response  UTI  >  rx Ancef sa per   flowsheet above> defer duration to [facility] ? If this contribuated to her downhill slide in terms  of AMS pre admit?    Labs   CBC: Recent Labs  Lab 12/25/20 0422 12/26/20 0441 12/27/20 0449 12/28/20 0430 12/29/20 0442  WBC 5.2 5.6 7.8 8.1 8.9  HGB 7.9* 8.1* 8.6* 8.2* 8.4*  HCT 27.1* 26.8* 29.0* 27.6* 29.1*  MCV 95.4 94.0 93.9 95.2 95.7  PLT 131* 156 190 181 681    Basic Metabolic Panel: Recent Labs  Lab 12/25/20 0422 12/25/20 1700 12/26/20 0441 12/26/20 1604  12/27/20 0449 12/28/20 0430 12/29/20 0442  NA 145  --  144  --  143 145 144  K 3.7  --  3.4*  --  3.4* 3.1* 3.3*  CL 107  --  107  --  106 109 108  CO2 33*  --  26  --  '30 29 29  ' GLUCOSE 110*  --  214*  --  218* 221* 198*  BUN 23  --  23  --  '18 14 13  ' CREATININE 0.86  --  0.94  --  0.95 0.91 0.79  CALCIUM 8.1*  --  8.3*  --  8.6* 8.5* 8.8*  MG 2.0 2.0 2.1 2.2  --   --  2.2  PHOS 3.8 3.7 3.3 2.7  --   --   --    GFR: Estimated Creatinine Clearance: 80.3 mL/min (by C-G formula based on SCr of 0.79 mg/dL). Recent Labs  Lab 12/26/20 0441 12/27/20 0449 12/28/20 0430 12/29/20 0442  WBC 5.6 7.8 8.1 8.9    Liver Function Tests: Recent Labs  Lab 12/25/20 0422 12/26/20 0441 12/27/20 0449 12/28/20 0430 12/29/20 0442  AST '26 24 26 29 23  ' ALT '16 15 11 9 7  ' ALKPHOS 43 48 56 75 86  BILITOT 0.7 0.9 0.5 0.5 0.7  PROT 4.9* 5.2* 5.6* 5.4* 5.7*  ALBUMIN 2.6* 2.7* 2.9* 2.8* 2.9*   No results for input(s): LIPASE, AMYLASE in the last 168 hours. No results for input(s): AMMONIA in the last 168 hours.  ABG    Component Value Date/Time   PHART 7.386 12/21/2020 1539   PCO2ART 55.0 (H) 12/21/2020 1539   PO2ART 235 (H) 12/21/2020 1539   HCO3 30.5 (H) 12/21/2020 1539   TCO2 27 09/06/2020 1238   ACIDBASEDEF 1.0 01/26/2015 1132   O2SAT 99.4 12/21/2020 1539     Coagulation Profile: No results for input(s): INR, PROTIME in the last 168 hours.  Cardiac Enzymes: No results for input(s): CKTOTAL, CKMB, CKMBINDEX, TROPONINI in the last 168 hours.  HbA1C: Hgb A1c MFr Bld  Date/Time Value Ref Range Status   11/22/2020 04:03 AM 7.6 (H) 4.8 - 5.6 % Final    Comment:    (NOTE) Pre diabetes:          5.7%-6.4%  Diabetes:              >6.4%  Glycemic control for   <7.0% adults with diabetes   08/24/2020 10:02 AM 7.9 (H) 4.8 - 5.6 % Final    Comment:    (NOTE) Pre diabetes:          5.7%-6.4%  Diabetes:              >6.4%  Glycemic control for   <7.0% adults with diabetes     CBG: Recent Labs  Lab 12/28/20 2107 12/28/20 2359 12/29/20 0356 12/29/20 0728 12/29/20 1103  GLUCAP 123* 162* 206* 146* 162*      Christinia Gully, MD [facility] [place] [phone]   After 7:00 pm call Elink  [phone]

## 2020-12-30 DIAGNOSIS — I5033 Acute on chronic diastolic (congestive) heart failure: Secondary | ICD-10-CM

## 2020-12-30 LAB — CBC
HCT: 27.9 % — ABNORMAL LOW (ref 36.0–46.0)
Hemoglobin: 8.1 g/dL — ABNORMAL LOW (ref 12.0–15.0)
MCH: 28 pg (ref 26.0–34.0)
MCHC: 29 g/dL — ABNORMAL LOW (ref 30.0–36.0)
MCV: 96.5 fL (ref 80.0–100.0)
Platelets: 210 10*3/uL (ref 150–400)
RBC: 2.89 MIL/uL — ABNORMAL LOW (ref 3.87–5.11)
RDW: 16.9 % — ABNORMAL HIGH (ref 11.5–15.5)
WBC: 7.4 10*3/uL (ref 4.0–10.5)
nRBC: 0.3 % — ABNORMAL HIGH (ref 0.0–0.2)

## 2020-12-30 LAB — GLUCOSE, CAPILLARY
Glucose-Capillary: 134 mg/dL — ABNORMAL HIGH (ref 70–99)
Glucose-Capillary: 146 mg/dL — ABNORMAL HIGH (ref 70–99)
Glucose-Capillary: 160 mg/dL — ABNORMAL HIGH (ref 70–99)
Glucose-Capillary: 89 mg/dL (ref 70–99)

## 2020-12-30 MED ORDER — LEVETIRACETAM 500 MG PO TABS: 1000.0000 mg | ORAL_TABLET | Freq: Every day | ORAL | Status: DC

## 2020-12-30 MED ORDER — LACTASE 9000 UNITS PO CHEW: 1.0000 | CHEWABLE_TABLET | ORAL | Status: DC | PRN

## 2020-12-30 MED ORDER — INSULIN GLARGINE 100 UNIT/ML ~~LOC~~ SOLN: 20.0000 [IU] | Freq: Every day | SUBCUTANEOUS | Status: DC

## 2020-12-30 NOTE — Discharge Summary (Signed)
Physician Discharge Summary  [name] TDV:[id number] DOB: 1951/01/12 DOA: 12/21/2020  PCP: Gareth Morgan, MD  Admit date: 12/21/2020 Discharge date: 12/30/2020  Admitted From: Home Disposition:  Home   Recommendations for Outpatient Follow-up:  Follow up with PCP in 1-2 weeks Please obtain BMP/CBC in one week   Home Health: YES Equipment/Devices: HHPT and 3L [place]  Discharge Condition: Stable CODE STATUS: FULL Diet recommendation: Heart Healthy /   Brief/Interim Summary: 70 year old female with a history of diastolic CHF, chronic atrial fibrillation, pulmonary hypertension, hypertension, hyperlipidemia, diabetes mellitus type 2, coronary artery disease with NSTEMI February 2022, CKD stage III, seizure disorder, chronic back pain, and dementia presenting with 2-day history of  worsening shortness of breath over the past couple days.  She was admitted with acute on chronic hypoxemic and hypercarbic respiratory failure in the setting of CHF exacerbation.  BiPAP was initially attempted, but she could not tolerate this and therefore was intubated.  PCCM now following for ventilator management.  She is having hypotension requiring pressor use  Discharge Diagnoses:   Acute on chronic respiratory failure with hypoxia and hypercarbia -Chronically on 2 L nasal cannula at home prior to admission -initially intubated and followed by PCCM -Secondary to pulmonary edema -Wean oxygen back to baseline level--on 3.5L at time of d/c -personally reviewed CXR--increased interstitial markings -We do not recommend reintubation due to poor prognosis -Pulmonary team agrees, patient's daughter would like to continue with a full scope of care Including reintubation, and possible tracheostomy if needed For that she needs to be transferred to [facility]  Acute on chronic diastolic CHF -08/29/2020 echo EF 30 to 55%, no WMA, PASP 77.7 -Patient is on furosemide 20 mg p.o. daily at home -Continue IV  furosemide>>home with lasix 20 mg po daily -11/22/20 Echo EF 55-60%, PASP 71 -09/22/2020 discharge weight 214 -11/20/2020 home weight 221 -Daily weights--bed weights -Accurate I's and O's--incomplete   Acute metabolic encephalopathy -Multifactorial including decompensated CHF/hypoxia, possibly Namenda introduction, and progression of underlying dementia -Patient remains confused with intermittent hallucination -Urine negative for pyuria -Check TSH--0.526 -B12--5145 -Folic acid--25.7 -Minimize hypnotic drugs -at baseline at time of dc   Chronic atrial fibrillation -Continue amiodarone -Continue apixaban -rate controlled   Hyperlipidemia -Continue statin   Dementia without behavioral disturbance -No longer taking Aricept -She is on as needed Ativan at home   Hypothyroidism -Continue Synthroid   Diabetes mellitus type 2 uncontrolled with hyperglycemia -NovoLog sliding scale -hemoglobin A1c 7.6   AKI on CKD stage IIIa -Baseline creatinine 0.8-1.1 -serum creatinine 0.79 on day of d/c   Coronary artery disease -Patient had NSTEMI February 2022 -She had transient suppression of her LVEF (15-20%) on echo 08/17/2020 -Continue apixaban -personally reviewed EKG--afib, nonspecific STT change   History of stroke -Continue apixaban   Seizure disorder -She is on Keppra 1000 mg twice daily   Anemia of chronic disease -Baseline Hgb ~9-10 -Transfuse for hemoglobin less than 7 or if she has significant bleeding   OSA/OHS -Patient intolerant of CPAP   Morbid obesity -BMI 38.25 -Lifestyle modification   Chronic back pain -She was on as needed hydrocodone at home   Hypomagnesemia -repleted  Discharge Instructions   Allergies as of 12/30/2020       Reactions   Citalopram Hydrobromide Other (See Comments)   Dyskinesia Other reaction(s): Other (See Comments) Dyskinesia   Codeine Nausea And Vomiting, Other (See Comments)   HALLUCINATIONS Other reaction(s):  Unknown Other reaction(s): Other (See Comments) HALLUCINATIONS   Hydromorphone Hcl Other (See Comments)  Made her pass out Other reaction(s): Other (See Comments) Made her pass out   Metoclopramide Other (See Comments)   DYSKINESIA Other reaction(s): Other (See Comments) DYSKINESIA   Hyoscyamine    MADE DIARRHEA WORSE        Medication List     TAKE these medications    acetaminophen 500 MG tablet Commonly known as: TYLENOL Take 500 mg by mouth as needed for moderate pain or headache.   amiodarone 200 MG tablet Commonly known as: PACERONE TAKE 1 TABLET BY MOUTH EVERY MORNING   apixaban 5 MG Tabs tablet Commonly known as: Eliquis Take 1 tablet (5 mg total) by mouth 2 (two) times daily.   atorvastatin 80 MG tablet Commonly known as: LIPITOR Place 1 tablet (80 mg total) into feeding tube daily.   DULoxetine 30 MG capsule Commonly known as: CYMBALTA Take 30 mg by mouth 2 (two) times daily.   feeding supplement Liqd Take 237 mLs by mouth 3 (three) times daily between meals.   furosemide 20 MG tablet Commonly known as: LASIX Take 1 tablet (20 mg total) by mouth daily.   gabapentin 100 MG capsule Commonly known as: NEURONTIN Take 1 capsule (100 mg total) by mouth 3 (three) times daily.   insulin glargine 100 UNIT/ML injection Commonly known as: LANTUS Inject 0.2 mLs (20 Units total) into the skin daily.   Lactase 9000 units Chew Chew 1 tablet (9,000 Units total) by mouth every 4 (four) hours as needed (when eating dairy products).   levETIRAcetam 500 MG tablet Commonly known as: Keppra Take 2 tablets (1,000 mg total) by mouth daily.   levothyroxine 175 MCG tablet Commonly known as: SYNTHROID Take 1 tablet by mouth daily.   loperamide 2 MG capsule Commonly known as: IMODIUM 1 PO Q4H PRN LOOSE STOOLS What changed:  how much to take how to take this when to take this reasons to take this   LORazepam 0.5 MG tablet Commonly known as: ATIVAN Take 0.5  mg by mouth every 8 (eight) hours as needed for anxiety or sedation.   meclizine 25 MG tablet Commonly known as: ANTIVERT Take 1 tablet (25 mg total) by mouth every 8 (eight) hours as needed for dizziness.   multivitamin with minerals Tabs tablet Take 1 tablet by mouth daily.   ondansetron 4 MG tablet Commonly known as: ZOFRAN TAKE 1 TABLET BY MOUTH FOUR TIMES DAILY AS NEEDED FOR NAUSEA What changed: See the new instructions.   OXYGEN 2 L daily.   pantoprazole 40 MG tablet Commonly known as: PROTONIX TAKE ONE TABLET BY MOUTH TWICE DAILY BEFORE APPLY MEAL What changed: See the new instructions.   potassium chloride 10 MEQ tablet Commonly known as: KLOR-CON Take 20 mEq by mouth daily.   SM Melatonin 3 MG Tabs tablet Generic drug: melatonin Take 3 mg by mouth at bedtime as needed for sleep.   Vitamin D3 50 MCG (2000 UT) capsule Take 2,000 Units by mouth daily.        Follow-up Information     [facility]   Specialty: Home Health Services Why: Will contact you to schedule home health visits. Contact information: [address] [place] [address] [phone]         Antoine PocheBranch, Jonathan F, MD Follow up on 01/21/2021.   Specialty: Cardiology Why: Keep scheduled Cardiology Follow-up on 01/21/2021 at 1:40 PM. Contact information: [address] [name] [address] [phone]  [facility] Follow up.   Specialty: Hospice Why: PALLIATIVE SERVICES Contact information: [address] [place] [address] [phone]               Allergies  Allergen Reactions   Citalopram Hydrobromide Other (See Comments)    Dyskinesia Other reaction(s): Other (See Comments) Dyskinesia   Codeine Nausea And Vomiting and Other (See Comments)    HALLUCINATIONS Other reaction(s): Unknown Other reaction(s): Other (See Comments) HALLUCINATIONS    Hydromorphone Hcl Other (See  Comments)    Made her pass out Other reaction(s): Other (See Comments) Made her pass out   Metoclopramide Other (See Comments)    DYSKINESIA Other reaction(s): Other (See Comments) DYSKINESIA   Hyoscyamine     MADE DIARRHEA WORSE    Consultations: PCCM cardiology   Procedures/Studies: DG Chest Port 1 View  Result Date: 12/25/2020 CLINICAL DATA:  Dyspnea. EXAM: PORTABLE CHEST 1 VIEW COMPARISON:  December 22, 2020. FINDINGS: Limited evaluation due to portable technique with patient rotation. Enlarged cardiac silhouette. Right upper extremity approach PICC catheter with the tip projecting in the expected region of the SVC. Calcific atherosclerosis. Pulmonary vascular congestion. Left greater than right bibasilar opacities. No visible pneumothorax on this limited AP supine radiograph. No visible right pleural effusion. Limited evaluation for left pleural effusion due to patient rotation, left basilar opacities, and the left costophrenic sulcus being excluded from the field of view. Polyarticular degenerative change. IMPRESSION: 1. Limited evaluation due to portable technique with patient rotation. 2. Cardiomegaly and central pulmonary vascular congestion. 3. Left greater than right bibasilar opacities, which could represent atelectasis, aspiration, and/or pneumonia. Electronically Signed   By: Feliberto Harts MD   On: 12/25/2020 11:48   DG CHEST PORT 1 VIEW  Result Date: 12/22/2020 CLINICAL DATA:  Status post PICC line placement EXAM: PORTABLE CHEST 1 VIEW COMPARISON:  12/21/2020 FINDINGS: Cardiac shadow is enlarged but stable. Aortic calcifications are again seen. Endotracheal tube and gastric catheter are again noted and stable. New right-sided PICC line is seen with the catheter tip in the distal superior vena cava. Patchy airspace opacities are again identified bilaterally stable from the prior exam. IMPRESSION: PICC line in satisfactory position. Remainder of the exam is stable. Electronically  Signed   By: Alcide Clever M.D.   On: 12/22/2020 16:20   Portable Chest x-ray  Result Date: 12/21/2020 CLINICAL DATA:  Intubation.  Short of breath. EXAM: PORTABLE CHEST 1 VIEW COMPARISON:  12/21/2020 at 10 a.m. FINDINGS: Endotracheal tube tip projects 2.4 cm above the carina. Nasal/orogastric tube passes well below the diaphragm, curling within the stomach, tip below the included field of view. Cardiac silhouette is enlarged. There is persistent vascular congestion with bilateral interstitial opacities. No new lung abnormalities. IMPRESSION: 1. Well-positioned endotracheal tube and nasal/orogastric tube. 2. No change in the appearance of the lungs or cardiomegaly. Suspect congestive heart failure. Electronically Signed   By: Amie Portland M.D.   On: 12/21/2020 16:20   DG Chest Port 1 View  Result Date: 12/21/2020 CLINICAL DATA:  Cough, shortness of breath EXAM: PORTABLE CHEST 1 VIEW COMPARISON:  11/21/2020 FINDINGS: Bilateral diffuse interstitial thickening. No focal consolidation. No pleural effusion or pneumothorax. Stable cardiomegaly. No acute osseous abnormality. IMPRESSION: Cardiomegaly with mild pulmonary vascular congestion. Electronically Signed   By: Elige Ko   On: 12/21/2020 10:33   [place] EKG SITE RITE  Result Date: 12/22/2020 If Site Rite image not attached, placement could not be confirmed due to current cardiac rhythm.  Discharge Exam: Vitals:   12/30/20 0257 12/30/20 0901  BP: 138/79   Pulse: 92   Resp: 19   Temp: 98.2 F (36.8 C)   SpO2: 100% 97%   Vitals:   12/29/20 2104 12/30/20 0257 12/30/20 0400 12/30/20 0901  BP: (!) 155/96 138/79    Pulse: 97 92    Resp: 20 19    Temp: 97.9 F (36.6 C) 98.2 F (36.8 C)    TempSrc: Oral     SpO2: 98% 100%  97%  Weight:   107.5 kg   Height:        General: Pt is alert, awake, not in acute distress Cardiovascular: IRRR, S1/S2 +, no rubs, no gallops Respiratory: bibasilar crackles. No wheeze Abdominal: Soft, NT,  ND, bowel sounds + Extremities: trace LEedema, no cyanosis   The results of significant diagnostics from this hospitalization (including imaging, microbiology, ancillary and laboratory) are listed below for reference.    Significant Diagnostic Studies: DG Chest Port 1 View  Result Date: 12/25/2020 CLINICAL DATA:  Dyspnea. EXAM: PORTABLE CHEST 1 VIEW COMPARISON:  December 22, 2020. FINDINGS: Limited evaluation due to portable technique with patient rotation. Enlarged cardiac silhouette. Right upper extremity approach PICC catheter with the tip projecting in the expected region of the SVC. Calcific atherosclerosis. Pulmonary vascular congestion. Left greater than right bibasilar opacities. No visible pneumothorax on this limited AP supine radiograph. No visible right pleural effusion. Limited evaluation for left pleural effusion due to patient rotation, left basilar opacities, and the left costophrenic sulcus being excluded from the field of view. Polyarticular degenerative change. IMPRESSION: 1. Limited evaluation due to portable technique with patient rotation. 2. Cardiomegaly and central pulmonary vascular congestion. 3. Left greater than right bibasilar opacities, which could represent atelectasis, aspiration, and/or pneumonia. Electronically Signed   By: Feliberto Harts MD   On: 12/25/2020 11:48   DG CHEST PORT 1 VIEW  Result Date: 12/22/2020 CLINICAL DATA:  Status post PICC line placement EXAM: PORTABLE CHEST 1 VIEW COMPARISON:  12/21/2020 FINDINGS: Cardiac shadow is enlarged but stable. Aortic calcifications are again seen. Endotracheal tube and gastric catheter are again noted and stable. New right-sided PICC line is seen with the catheter tip in the distal superior vena cava. Patchy airspace opacities are again identified bilaterally stable from the prior exam. IMPRESSION: PICC line in satisfactory position. Remainder of the exam is stable. Electronically Signed   By: Alcide Clever M.D.   On:  12/22/2020 16:20   Portable Chest x-ray  Result Date: 12/21/2020 CLINICAL DATA:  Intubation.  Short of breath. EXAM: PORTABLE CHEST 1 VIEW COMPARISON:  12/21/2020 at 10 a.m. FINDINGS: Endotracheal tube tip projects 2.4 cm above the carina. Nasal/orogastric tube passes well below the diaphragm, curling within the stomach, tip below the included field of view. Cardiac silhouette is enlarged. There is persistent vascular congestion with bilateral interstitial opacities. No new lung abnormalities. IMPRESSION: 1. Well-positioned endotracheal tube and nasal/orogastric tube. 2. No change in the appearance of the lungs or cardiomegaly. Suspect congestive heart failure. Electronically Signed   By: Amie Portland M.D.   On: 12/21/2020 16:20   DG Chest Port 1 View  Result Date: 12/21/2020 CLINICAL DATA:  Cough, shortness of breath EXAM: PORTABLE CHEST 1 VIEW COMPARISON:  11/21/2020 FINDINGS: Bilateral diffuse interstitial thickening. No focal consolidation. No pleural effusion or pneumothorax. Stable cardiomegaly. No acute osseous abnormality. IMPRESSION: Cardiomegaly with mild pulmonary vascular congestion. Electronically Signed   By: Elige Ko   On: 12/21/2020 10:33   [place]  EKG SITE RITE  Result Date: 12/22/2020 If Site Rite image not attached, placement could not be confirmed due to current cardiac rhythm.   Microbiology: Recent Results (from the past 240 hour(s))  MRSA Next Gen by PCR, Nasal     Status: None   Collection Time: 12/21/20  9:20 AM   Specimen: Nasal Mucosa; Nasal Swab  Result Value Ref Range Status   MRSA by PCR Next Gen NOT DETECTED NOT DETECTED Final    Comment: (NOTE) The GeneXpert MRSA Assay (FDA approved for NASAL specimens only), is one component of a comprehensive MRSA colonization surveillance program. It is not intended to diagnose MRSA infection nor to guide or monitor treatment for MRSA infections. Test performance is not FDA approved in patients less than 69  years old. Performed at [facility], [address], [place], [place] [address]   Resp Panel by RT-PCR (Flu A&B, Covid) Nasopharyngeal Swab     Status: None   Collection Time: 12/21/20 11:45 AM   Specimen: Nasopharyngeal Swab; Nasopharyngeal(NP) swabs in vial transport medium  Result Value Ref Range Status   SARS Coronavirus 2 by RT PCR NEGATIVE NEGATIVE Final    Comment: (NOTE) SARS-CoV-2 target nucleic acids are NOT DETECTED.  The SARS-CoV-2 RNA is generally detectable in upper respiratory specimens during the acute phase of infection. The lowest concentration of SARS-CoV-2 viral copies this assay can detect is 138 copies/mL. A negative result does not preclude SARS-Cov-2 infection and should not be used as the sole basis for treatment or other patient management decisions. A negative result may occur with  improper specimen collection/handling, submission of specimen other than nasopharyngeal swab, presence of viral mutation(s) within the areas targeted by this assay, and inadequate number of viral copies(<138 copies/mL). A negative result must be combined with clinical observations, patient history, and epidemiological information. The expected result is Negative.  Fact Sheet for Patients:  BloggerCourse.com  Fact Sheet for Healthcare Providers:  SeriousBroker.it  This test is no t yet approved or cleared by the [place] FDA and  has been authorized for detection and/or diagnosis of SARS-CoV-2 by FDA under an Emergency Use Authorization (EUA). This EUA will remain  in effect (meaning this test can be used) for the duration of the COVID-19 declaration under Section 564(b)(1) of the Act, 21 U.S.C.section 360bbb-3(b)(1), unless the authorization is terminated  or revoked sooner.       Influenza A by PCR NEGATIVE NEGATIVE Final   Influenza B by PCR NEGATIVE NEGATIVE Final    Comment: (NOTE) The Xpert Xpress  SARS-CoV-2/FLU/RSV plus assay is intended as an aid in the diagnosis of influenza from Nasopharyngeal swab specimens and should not be used as a sole basis for treatment. Nasal washings and aspirates are unacceptable for Xpert Xpress SARS-CoV-2/FLU/RSV testing.  Fact Sheet for Patients: BloggerCourse.com  Fact Sheet for Healthcare Providers: SeriousBroker.it  This test is not yet approved or cleared by the [place] FDA and has been authorized for detection and/or diagnosis of SARS-CoV-2 by FDA under an Emergency Use Authorization (EUA). This EUA will remain in effect (meaning this test can be used) for the duration of the COVID-19 declaration under Section 564(b)(1) of the Act, 21 U.S.C. section 360bbb-3(b)(1), unless the authorization is terminated or revoked.  Performed at [facility], [address] [place], [place] [address]   Culture, Urine     Status: Abnormal   Collection Time: 12/21/20  4:43 PM   Specimen: Urine, Random  Result Value Ref Range Status  Specimen Description   Final    URINE, RANDOM Performed at [facility], [address] [place], [place] [address]    Special Requests   Final    NONE Performed at [facility], [address] [place], [place] [address]    Culture >=100,000 COLONIES/mL ESCHERICHIA COLI (A)  Final   Report Status 12/24/2020 FINAL  Final   Organism ID, Bacteria ESCHERICHIA COLI (A)  Final      Susceptibility   Escherichia coli - MIC*    AMPICILLIN <=2 SENSITIVE Sensitive     CEFAZOLIN <=4 SENSITIVE Sensitive     CEFEPIME <=0.12 SENSITIVE Sensitive     CEFTRIAXONE <=0.25 SENSITIVE Sensitive     CIPROFLOXACIN <=0.25 SENSITIVE Sensitive     GENTAMICIN <=1 SENSITIVE Sensitive     IMIPENEM <=0.25 SENSITIVE Sensitive     NITROFURANTOIN <=16 SENSITIVE Sensitive     TRIMETH/SULFA <=20 SENSITIVE Sensitive     AMPICILLIN/SULBACTAM <=2 SENSITIVE Sensitive     PIP/TAZO <=4  SENSITIVE Sensitive     * >=100,000 COLONIES/mL ESCHERICHIA COLI     Labs: Basic Metabolic Panel: Recent Labs  Lab 12/25/20 0422 12/25/20 1700 12/26/20 0441 12/26/20 1604 12/27/20 0449 12/28/20 0430 12/29/20 0442  NA 145  --  144  --  143 145 144  K 3.7  --  3.4*  --  3.4* 3.1* 3.3*  CL 107  --  107  --  106 109 108  CO2 33*  --  26  --  30 29 29   GLUCOSE 110*  --  214*  --  218* 221* 198*  BUN 23  --  23  --  18 14 13   CREATININE 0.86  --  0.94  --  0.95 0.91 0.79  CALCIUM 8.1*  --  8.3*  --  8.6* 8.5* 8.8*  MG 2.0 2.0 2.1 2.2  --   --  2.2  PHOS 3.8 3.7 3.3 2.7  --   --   --    Liver Function Tests: Recent Labs  Lab 12/25/20 0422 12/26/20 0441 12/27/20 0449 12/28/20 0430 12/29/20 0442  AST 26 24 26 29 23   ALT 16 15 11 9 7   ALKPHOS 43 48 56 75 86  BILITOT 0.7 0.9 0.5 0.5 0.7  PROT 4.9* 5.2* 5.6* 5.4* 5.7*  ALBUMIN 2.6* 2.7* 2.9* 2.8* 2.9*   No results for input(s): LIPASE, AMYLASE in the last 168 hours. No results for input(s): AMMONIA in the last 168 hours. CBC: Recent Labs  Lab 12/26/20 0441 12/27/20 0449 12/28/20 0430 12/29/20 0442 12/30/20 0035  WBC 5.6 7.8 8.1 8.9 7.4  HGB 8.1* 8.6* 8.2* 8.4* 8.1*  HCT 26.8* 29.0* 27.6* 29.1* 27.9*  MCV 94.0 93.9 95.2 95.7 96.5  PLT 156 190 181 203 210   Cardiac Enzymes: No results for input(s): CKTOTAL, CKMB, CKMBINDEX, TROPONINI in the last 168 hours. BNP: Invalid input(s): POCBNP CBG: Recent Labs  Lab 12/29/20 1631 12/29/20 2037 12/30/20 0027 12/30/20 0409 12/30/20 0803  GLUCAP 155* 115* 146* 89 134*    Time coordinating discharge:  36 minutes  Signed:  2038, DO [facility] Pager: [phone] 12/30/2020, 11:38 AM

## 2020-12-30 NOTE — Care Management Important Message (Signed)
Important Message  Patient Details  Name: [name] MRN: [id number] Date of Birth: March 06, 1951   Medicare Important Message Given:  Yes     Corey Harold 12/30/2020, 11:56 AM

## 2020-12-30 NOTE — Progress Notes (Signed)
PICC line removed without complications. Pressure held for 5 minutes and vaseline pressure dressing applied. Patient advised to lay flat for at least 30 minutes. Discharge instructions also provided to both patient and daughter at bedside.

## 2021-01-02 DIAGNOSIS — I5032 Chronic diastolic (congestive) heart failure: Secondary | ICD-10-CM | POA: Diagnosis not present

## 2021-01-02 DIAGNOSIS — R269 Unspecified abnormalities of gait and mobility: Secondary | ICD-10-CM | POA: Diagnosis not present

## 2021-01-03 ENCOUNTER — Inpatient Hospital Stay (HOSPITAL_COMMUNITY): Payer: PPO

## 2021-01-03 ENCOUNTER — Inpatient Hospital Stay (HOSPITAL_COMMUNITY)
Admission: EM | Admit: 2021-01-03 | Discharge: 2021-02-15 | DRG: 003 | Disposition: A | Discharge status: Home Health | Payer: PPO | Attending: Internal Medicine | Admitting: Internal Medicine

## 2021-01-03 ENCOUNTER — Emergency Department (HOSPITAL_COMMUNITY): Payer: PPO

## 2021-01-03 ENCOUNTER — Other Ambulatory Visit: Payer: Self-pay

## 2021-01-03 ENCOUNTER — Encounter (HOSPITAL_COMMUNITY): Payer: Self-pay

## 2021-01-03 ENCOUNTER — Ambulatory Visit: Payer: Self-pay

## 2021-01-03 DIAGNOSIS — E1165 Type 2 diabetes mellitus with hyperglycemia: Secondary | ICD-10-CM | POA: Diagnosis not present

## 2021-01-03 DIAGNOSIS — R4182 Altered mental status, unspecified: Secondary | ICD-10-CM | POA: Diagnosis not present

## 2021-01-03 DIAGNOSIS — Z79890 Hormone replacement therapy (postmenopausal): Secondary | ICD-10-CM

## 2021-01-03 DIAGNOSIS — E1122 Type 2 diabetes mellitus with diabetic chronic kidney disease: Secondary | ICD-10-CM | POA: Diagnosis not present

## 2021-01-03 DIAGNOSIS — E440 Moderate protein-calorie malnutrition: Secondary | ICD-10-CM | POA: Diagnosis present

## 2021-01-03 DIAGNOSIS — Z930 Tracheostomy status: Secondary | ICD-10-CM | POA: Diagnosis not present

## 2021-01-03 DIAGNOSIS — Y848 Other medical procedures as the cause of abnormal reaction of the patient, or of later complication, without mention of misadventure at the time of the procedure: Secondary | ICD-10-CM | POA: Diagnosis not present

## 2021-01-03 DIAGNOSIS — J189 Pneumonia, unspecified organism: Secondary | ICD-10-CM | POA: Diagnosis present

## 2021-01-03 DIAGNOSIS — F419 Anxiety disorder, unspecified: Secondary | ICD-10-CM | POA: Diagnosis present

## 2021-01-03 DIAGNOSIS — Z95828 Presence of other vascular implants and grafts: Secondary | ICD-10-CM | POA: Diagnosis not present

## 2021-01-03 DIAGNOSIS — R042 Hemoptysis: Secondary | ICD-10-CM | POA: Diagnosis not present

## 2021-01-03 DIAGNOSIS — R269 Unspecified abnormalities of gait and mobility: Secondary | ICD-10-CM | POA: Diagnosis not present

## 2021-01-03 DIAGNOSIS — E039 Hypothyroidism, unspecified: Secondary | ICD-10-CM | POA: Diagnosis not present

## 2021-01-03 DIAGNOSIS — J9811 Atelectasis: Secondary | ICD-10-CM | POA: Diagnosis present

## 2021-01-03 DIAGNOSIS — R6521 Severe sepsis with septic shock: Secondary | ICD-10-CM | POA: Diagnosis not present

## 2021-01-03 DIAGNOSIS — Z794 Long term (current) use of insulin: Secondary | ICD-10-CM

## 2021-01-03 DIAGNOSIS — R739 Hyperglycemia, unspecified: Secondary | ICD-10-CM | POA: Diagnosis not present

## 2021-01-03 DIAGNOSIS — J449 Chronic obstructive pulmonary disease, unspecified: Secondary | ICD-10-CM | POA: Diagnosis present

## 2021-01-03 DIAGNOSIS — E785 Hyperlipidemia, unspecified: Secondary | ICD-10-CM | POA: Diagnosis present

## 2021-01-03 DIAGNOSIS — I517 Cardiomegaly: Secondary | ICD-10-CM | POA: Diagnosis not present

## 2021-01-03 DIAGNOSIS — F0390 Unspecified dementia without behavioral disturbance: Secondary | ICD-10-CM | POA: Diagnosis not present

## 2021-01-03 DIAGNOSIS — R404 Transient alteration of awareness: Secondary | ICD-10-CM | POA: Diagnosis not present

## 2021-01-03 DIAGNOSIS — Z9049 Acquired absence of other specified parts of digestive tract: Secondary | ICD-10-CM

## 2021-01-03 DIAGNOSIS — I252 Old myocardial infarction: Secondary | ICD-10-CM

## 2021-01-03 DIAGNOSIS — M4102 Infantile idiopathic scoliosis, cervical region: Secondary | ICD-10-CM | POA: Diagnosis not present

## 2021-01-03 DIAGNOSIS — J9601 Acute respiratory failure with hypoxia: Secondary | ICD-10-CM | POA: Diagnosis present

## 2021-01-03 DIAGNOSIS — I2510 Atherosclerotic heart disease of native coronary artery without angina pectoris: Secondary | ICD-10-CM | POA: Diagnosis present

## 2021-01-03 DIAGNOSIS — Z820 Family history of epilepsy and other diseases of the nervous system: Secondary | ICD-10-CM

## 2021-01-03 DIAGNOSIS — Z20822 Contact with and (suspected) exposure to covid-19: Secondary | ICD-10-CM | POA: Diagnosis not present

## 2021-01-03 DIAGNOSIS — I700 Atherosclerosis of aorta: Secondary | ICD-10-CM | POA: Diagnosis not present

## 2021-01-03 DIAGNOSIS — Z823 Family history of stroke: Secondary | ICD-10-CM

## 2021-01-03 DIAGNOSIS — R069 Unspecified abnormalities of breathing: Secondary | ICD-10-CM | POA: Diagnosis not present

## 2021-01-03 DIAGNOSIS — R1110 Vomiting, unspecified: Secondary | ICD-10-CM

## 2021-01-03 DIAGNOSIS — Z9911 Dependence on respirator [ventilator] status: Secondary | ICD-10-CM

## 2021-01-03 DIAGNOSIS — Z7901 Long term (current) use of anticoagulants: Secondary | ICD-10-CM | POA: Diagnosis not present

## 2021-01-03 DIAGNOSIS — R410 Disorientation, unspecified: Secondary | ICD-10-CM | POA: Diagnosis not present

## 2021-01-03 DIAGNOSIS — Z9981 Dependence on supplemental oxygen: Secondary | ICD-10-CM

## 2021-01-03 DIAGNOSIS — E1143 Type 2 diabetes mellitus with diabetic autonomic (poly)neuropathy: Secondary | ICD-10-CM | POA: Diagnosis not present

## 2021-01-03 DIAGNOSIS — T462X5A Adverse effect of other antidysrhythmic drugs, initial encounter: Secondary | ICD-10-CM | POA: Diagnosis not present

## 2021-01-03 DIAGNOSIS — K859 Acute pancreatitis without necrosis or infection, unspecified: Secondary | ICD-10-CM | POA: Diagnosis not present

## 2021-01-03 DIAGNOSIS — D638 Anemia in other chronic diseases classified elsewhere: Secondary | ICD-10-CM | POA: Diagnosis present

## 2021-01-03 DIAGNOSIS — G9341 Metabolic encephalopathy: Secondary | ICD-10-CM | POA: Diagnosis not present

## 2021-01-03 DIAGNOSIS — M810 Age-related osteoporosis without current pathological fracture: Secondary | ICD-10-CM | POA: Diagnosis not present

## 2021-01-03 DIAGNOSIS — N1831 Chronic kidney disease, stage 3a: Secondary | ICD-10-CM | POA: Diagnosis present

## 2021-01-03 DIAGNOSIS — Z885 Allergy status to narcotic agent status: Secondary | ICD-10-CM

## 2021-01-03 DIAGNOSIS — E119 Type 2 diabetes mellitus without complications: Secondary | ICD-10-CM

## 2021-01-03 DIAGNOSIS — R5381 Other malaise: Secondary | ICD-10-CM

## 2021-01-03 DIAGNOSIS — K3184 Gastroparesis: Secondary | ICD-10-CM | POA: Diagnosis present

## 2021-01-03 DIAGNOSIS — R0902 Hypoxemia: Secondary | ICD-10-CM

## 2021-01-03 DIAGNOSIS — R109 Unspecified abdominal pain: Secondary | ICD-10-CM

## 2021-01-03 DIAGNOSIS — Z803 Family history of malignant neoplasm of breast: Secondary | ICD-10-CM

## 2021-01-03 DIAGNOSIS — I482 Chronic atrial fibrillation, unspecified: Secondary | ICD-10-CM | POA: Diagnosis not present

## 2021-01-03 DIAGNOSIS — R609 Edema, unspecified: Secondary | ICD-10-CM | POA: Diagnosis not present

## 2021-01-03 DIAGNOSIS — K648 Other hemorrhoids: Secondary | ICD-10-CM | POA: Diagnosis present

## 2021-01-03 DIAGNOSIS — N390 Urinary tract infection, site not specified: Secondary | ICD-10-CM | POA: Diagnosis present

## 2021-01-03 DIAGNOSIS — G40909 Epilepsy, unspecified, not intractable, without status epilepticus: Secondary | ICD-10-CM | POA: Diagnosis not present

## 2021-01-03 DIAGNOSIS — K2270 Barrett's esophagus without dysplasia: Secondary | ICD-10-CM | POA: Diagnosis present

## 2021-01-03 DIAGNOSIS — J9690 Respiratory failure, unspecified, unspecified whether with hypoxia or hypercapnia: Secondary | ICD-10-CM | POA: Diagnosis not present

## 2021-01-03 DIAGNOSIS — I130 Hypertensive heart and chronic kidney disease with heart failure and stage 1 through stage 4 chronic kidney disease, or unspecified chronic kidney disease: Secondary | ICD-10-CM | POA: Diagnosis present

## 2021-01-03 DIAGNOSIS — E11649 Type 2 diabetes mellitus with hypoglycemia without coma: Secondary | ICD-10-CM | POA: Diagnosis not present

## 2021-01-03 DIAGNOSIS — Z87891 Personal history of nicotine dependence: Secondary | ICD-10-CM

## 2021-01-03 DIAGNOSIS — Z4682 Encounter for fitting and adjustment of non-vascular catheter: Secondary | ICD-10-CM | POA: Diagnosis not present

## 2021-01-03 DIAGNOSIS — K5790 Diverticulosis of intestine, part unspecified, without perforation or abscess without bleeding: Secondary | ICD-10-CM | POA: Diagnosis present

## 2021-01-03 DIAGNOSIS — J9621 Acute and chronic respiratory failure with hypoxia: Secondary | ICD-10-CM | POA: Diagnosis not present

## 2021-01-03 DIAGNOSIS — Z8673 Personal history of transient ischemic attack (TIA), and cerebral infarction without residual deficits: Secondary | ICD-10-CM

## 2021-01-03 DIAGNOSIS — I4811 Longstanding persistent atrial fibrillation: Secondary | ICD-10-CM | POA: Diagnosis not present

## 2021-01-03 DIAGNOSIS — I5043 Acute on chronic combined systolic (congestive) and diastolic (congestive) heart failure: Secondary | ICD-10-CM | POA: Diagnosis not present

## 2021-01-03 DIAGNOSIS — Z7189 Other specified counseling: Secondary | ICD-10-CM | POA: Diagnosis not present

## 2021-01-03 DIAGNOSIS — I2729 Other secondary pulmonary hypertension: Secondary | ICD-10-CM | POA: Diagnosis present

## 2021-01-03 DIAGNOSIS — Z452 Encounter for adjustment and management of vascular access device: Secondary | ICD-10-CM | POA: Diagnosis not present

## 2021-01-03 DIAGNOSIS — J9602 Acute respiratory failure with hypercapnia: Secondary | ICD-10-CM | POA: Diagnosis not present

## 2021-01-03 DIAGNOSIS — G8929 Other chronic pain: Secondary | ICD-10-CM | POA: Diagnosis not present

## 2021-01-03 DIAGNOSIS — G4733 Obstructive sleep apnea (adult) (pediatric): Secondary | ICD-10-CM | POA: Diagnosis not present

## 2021-01-03 DIAGNOSIS — Z515 Encounter for palliative care: Secondary | ICD-10-CM

## 2021-01-03 DIAGNOSIS — E2740 Unspecified adrenocortical insufficiency: Secondary | ICD-10-CM | POA: Diagnosis present

## 2021-01-03 DIAGNOSIS — I2781 Cor pulmonale (chronic): Secondary | ICD-10-CM | POA: Diagnosis present

## 2021-01-03 DIAGNOSIS — J9622 Acute and chronic respiratory failure with hypercapnia: Secondary | ICD-10-CM

## 2021-01-03 DIAGNOSIS — Z6828 Body mass index (BMI) 28.0-28.9, adult: Secondary | ICD-10-CM

## 2021-01-03 DIAGNOSIS — G9340 Encephalopathy, unspecified: Secondary | ICD-10-CM | POA: Diagnosis not present

## 2021-01-03 DIAGNOSIS — R1310 Dysphagia, unspecified: Secondary | ICD-10-CM | POA: Diagnosis not present

## 2021-01-03 DIAGNOSIS — Z888 Allergy status to other drugs, medicaments and biological substances status: Secondary | ICD-10-CM

## 2021-01-03 DIAGNOSIS — I4821 Permanent atrial fibrillation: Secondary | ICD-10-CM | POA: Diagnosis present

## 2021-01-03 DIAGNOSIS — J9600 Acute respiratory failure, unspecified whether with hypoxia or hypercapnia: Secondary | ICD-10-CM

## 2021-01-03 DIAGNOSIS — R0602 Shortness of breath: Secondary | ICD-10-CM | POA: Diagnosis not present

## 2021-01-03 DIAGNOSIS — I214 Non-ST elevation (NSTEMI) myocardial infarction: Secondary | ICD-10-CM | POA: Diagnosis not present

## 2021-01-03 DIAGNOSIS — R0689 Other abnormalities of breathing: Secondary | ICD-10-CM | POA: Diagnosis not present

## 2021-01-03 DIAGNOSIS — E662 Morbid (severe) obesity with alveolar hypoventilation: Secondary | ICD-10-CM | POA: Diagnosis not present

## 2021-01-03 DIAGNOSIS — D649 Anemia, unspecified: Secondary | ICD-10-CM | POA: Diagnosis not present

## 2021-01-03 DIAGNOSIS — I5032 Chronic diastolic (congestive) heart failure: Secondary | ICD-10-CM | POA: Diagnosis not present

## 2021-01-03 DIAGNOSIS — K449 Diaphragmatic hernia without obstruction or gangrene: Secondary | ICD-10-CM | POA: Diagnosis present

## 2021-01-03 DIAGNOSIS — D62 Acute posthemorrhagic anemia: Secondary | ICD-10-CM | POA: Diagnosis not present

## 2021-01-03 DIAGNOSIS — A419 Sepsis, unspecified organism: Secondary | ICD-10-CM | POA: Diagnosis not present

## 2021-01-03 DIAGNOSIS — M419 Scoliosis, unspecified: Secondary | ICD-10-CM | POA: Diagnosis present

## 2021-01-03 DIAGNOSIS — Z79899 Other long term (current) drug therapy: Secondary | ICD-10-CM

## 2021-01-03 DIAGNOSIS — Z6834 Body mass index (BMI) 34.0-34.9, adult: Secondary | ICD-10-CM

## 2021-01-03 DIAGNOSIS — I4891 Unspecified atrial fibrillation: Secondary | ICD-10-CM | POA: Diagnosis not present

## 2021-01-03 DIAGNOSIS — E876 Hypokalemia: Secondary | ICD-10-CM | POA: Diagnosis not present

## 2021-01-03 DIAGNOSIS — F32A Depression, unspecified: Secondary | ICD-10-CM | POA: Diagnosis present

## 2021-01-03 DIAGNOSIS — R1312 Dysphagia, oropharyngeal phase: Secondary | ICD-10-CM | POA: Diagnosis present

## 2021-01-03 DIAGNOSIS — Z8249 Family history of ischemic heart disease and other diseases of the circulatory system: Secondary | ICD-10-CM

## 2021-01-03 DIAGNOSIS — N179 Acute kidney failure, unspecified: Secondary | ICD-10-CM | POA: Diagnosis not present

## 2021-01-03 DIAGNOSIS — J9620 Acute and chronic respiratory failure, unspecified whether with hypoxia or hypercapnia: Secondary | ICD-10-CM | POA: Diagnosis not present

## 2021-01-03 DIAGNOSIS — J8410 Pulmonary fibrosis, unspecified: Secondary | ICD-10-CM | POA: Diagnosis present

## 2021-01-03 DIAGNOSIS — S22080A Wedge compression fracture of T11-T12 vertebra, initial encounter for closed fracture: Secondary | ICD-10-CM | POA: Diagnosis not present

## 2021-01-03 DIAGNOSIS — D6489 Other specified anemias: Secondary | ICD-10-CM | POA: Diagnosis present

## 2021-01-03 DIAGNOSIS — K589 Irritable bowel syndrome without diarrhea: Secondary | ICD-10-CM | POA: Diagnosis present

## 2021-01-03 DIAGNOSIS — Z4659 Encounter for fitting and adjustment of other gastrointestinal appliance and device: Secondary | ICD-10-CM

## 2021-01-03 DIAGNOSIS — Z01818 Encounter for other preprocedural examination: Secondary | ICD-10-CM

## 2021-01-03 DIAGNOSIS — J9692 Respiratory failure, unspecified with hypercapnia: Secondary | ICD-10-CM | POA: Diagnosis present

## 2021-01-03 DIAGNOSIS — K5730 Diverticulosis of large intestine without perforation or abscess without bleeding: Secondary | ICD-10-CM | POA: Diagnosis not present

## 2021-01-03 DIAGNOSIS — I5031 Acute diastolic (congestive) heart failure: Secondary | ICD-10-CM | POA: Diagnosis not present

## 2021-01-03 DIAGNOSIS — M1990 Unspecified osteoarthritis, unspecified site: Secondary | ICD-10-CM | POA: Diagnosis present

## 2021-01-03 DIAGNOSIS — R001 Bradycardia, unspecified: Secondary | ICD-10-CM | POA: Diagnosis not present

## 2021-01-03 DIAGNOSIS — Z90710 Acquired absence of both cervix and uterus: Secondary | ICD-10-CM

## 2021-01-03 DIAGNOSIS — J984 Other disorders of lung: Secondary | ICD-10-CM | POA: Diagnosis present

## 2021-01-03 DIAGNOSIS — I5020 Unspecified systolic (congestive) heart failure: Secondary | ICD-10-CM | POA: Diagnosis not present

## 2021-01-03 DIAGNOSIS — J181 Lobar pneumonia, unspecified organism: Secondary | ICD-10-CM | POA: Diagnosis not present

## 2021-01-03 DIAGNOSIS — G709 Myoneural disorder, unspecified: Secondary | ICD-10-CM | POA: Diagnosis present

## 2021-01-03 DIAGNOSIS — J90 Pleural effusion, not elsewhere classified: Secondary | ICD-10-CM | POA: Diagnosis not present

## 2021-01-03 DIAGNOSIS — K219 Gastro-esophageal reflux disease without esophagitis: Secondary | ICD-10-CM | POA: Diagnosis present

## 2021-01-03 LAB — BLOOD GAS, ARTERIAL
Acid-Base Excess: 4.5 mmol/L — ABNORMAL HIGH (ref 0.0–2.0)
Acid-Base Excess: 6.1 mmol/L — ABNORMAL HIGH (ref 0.0–2.0)
Bicarbonate: 27.6 mmol/L (ref 20.0–28.0)
Bicarbonate: 29 mmol/L — ABNORMAL HIGH (ref 20.0–28.0)
FIO2: 36
FIO2: 50
O2 Saturation: 90.2 %
O2 Saturation: 69.8 %
Patient temperature: 37
Patient temperature: 37
pCO2 arterial: 72 mmHg (ref 32.0–48.0)
pCO2 arterial: 49.9 mmHg — ABNORMAL HIGH (ref 32.0–48.0)
pH, Arterial: 7.259 — ABNORMAL LOW (ref 7.350–7.450)
pH, Arterial: 7.406 (ref 7.350–7.450)
pO2, Arterial: 71.3 mmHg — ABNORMAL LOW (ref 83.0–108.0)
pO2, Arterial: 38.8 mmHg — CL (ref 83.0–108.0)

## 2021-01-03 LAB — URINALYSIS, ROUTINE W REFLEX MICROSCOPIC
Bacteria, UA: NONE SEEN
Bilirubin Urine: NEGATIVE
Glucose, UA: 50 mg/dL — AB
Ketones, ur: 80 mg/dL — AB
Nitrite: NEGATIVE
Protein, ur: NEGATIVE mg/dL
Specific Gravity, Urine: 1.02 (ref 1.005–1.030)
pH: 6 (ref 5.0–8.0)

## 2021-01-03 LAB — COMPREHENSIVE METABOLIC PANEL
ALT: 9 U/L (ref 0–44)
AST: 24 U/L (ref 15–41)
Albumin: 3.2 g/dL — ABNORMAL LOW (ref 3.5–5.0)
Alkaline Phosphatase: 89 U/L (ref 38–126)
Anion gap: 5 (ref 5–15)
BUN: 19 mg/dL (ref 8–23)
CO2: 31 mmol/L (ref 22–32)
Calcium: 8.5 mg/dL — ABNORMAL LOW (ref 8.9–10.3)
Chloride: 103 mmol/L (ref 98–111)
Creatinine, Ser: 1 mg/dL (ref 0.44–1.00)
GFR, Estimated: >60 mL/min (ref >60)
Glucose, Bld: 287 mg/dL — ABNORMAL HIGH (ref 70–99)
Potassium: 4.9 mmol/L (ref 3.5–5.1)
Sodium: 139 mmol/L (ref 135–145)
Total Bilirubin: 0.4 mg/dL (ref 0.3–1.2)
Total Protein: 6.4 g/dL — ABNORMAL LOW (ref 6.5–8.1)

## 2021-01-03 LAB — CBC
HCT: 31.6 % — ABNORMAL LOW (ref 36.0–46.0)
Hemoglobin: 9.1 g/dL — ABNORMAL LOW (ref 12.0–15.0)
MCH: 28.5 pg (ref 26.0–34.0)
MCHC: 28.8 g/dL — ABNORMAL LOW (ref 30.0–36.0)
MCV: 99.1 fL (ref 80.0–100.0)
Platelets: 243 10*3/uL (ref 150–400)
RBC: 3.19 MIL/uL — ABNORMAL LOW (ref 3.87–5.11)
RDW: 17.9 % — ABNORMAL HIGH (ref 11.5–15.5)
WBC: 9.6 10*3/uL (ref 4.0–10.5)
nRBC: 0.4 % — ABNORMAL HIGH (ref 0.0–0.2)

## 2021-01-03 LAB — POCT I-STAT 7, (LYTES, BLD GAS, ICA,H+H)
Acid-Base Excess: 10 mmol/L — ABNORMAL HIGH (ref 0.0–2.0)
Bicarbonate: 33.6 mmol/L — ABNORMAL HIGH (ref 20.0–28.0)
Calcium, Ion: 1.16 mmol/L (ref 1.15–1.40)
HCT: 24 % — ABNORMAL LOW (ref 36.0–46.0)
Hemoglobin: 8.2 g/dL — ABNORMAL LOW (ref 12.0–15.0)
O2 Saturation: 100 %
Patient temperature: 98.7
Potassium: 5.4 mmol/L — ABNORMAL HIGH (ref 3.5–5.1)
Sodium: 138 mmol/L (ref 135–145)
TCO2: 35 mmol/L — ABNORMAL HIGH (ref 22–32)
pCO2 arterial: 38.1 mmHg (ref 32.0–48.0)
pH, Arterial: 7.554 — ABNORMAL HIGH (ref 7.350–7.450)
pO2, Arterial: 270 mmHg — ABNORMAL HIGH (ref 83.0–108.0)

## 2021-01-03 LAB — RESP PANEL BY RT-PCR (FLU A&B, COVID) ARPGX2
Influenza A by PCR: NEGATIVE
Influenza B by PCR: NEGATIVE
SARS Coronavirus 2 by RT PCR: NEGATIVE

## 2021-01-03 LAB — TROPONIN I (HIGH SENSITIVITY)
Troponin I (High Sensitivity): 108 ng/L (ref <18)
Troponin I (High Sensitivity): 102 ng/L (ref <18)

## 2021-01-03 LAB — GLUCOSE, CAPILLARY
Glucose-Capillary: 250 mg/dL — ABNORMAL HIGH (ref 70–99)
Glucose-Capillary: 258 mg/dL — ABNORMAL HIGH (ref 70–99)

## 2021-01-03 LAB — BRAIN NATRIURETIC PEPTIDE: B Natriuretic Peptide: 614 pg/mL — ABNORMAL HIGH (ref 0.0–100.0)

## 2021-01-03 LAB — MRSA NEXT GEN BY PCR, NASAL: MRSA by PCR Next Gen: NOT DETECTED

## 2021-01-03 MED ORDER — FENTANYL BOLUS VIA INFUSION
25.0000 ug | INTRAVENOUS | Status: DC | PRN
  Administered 2021-01-07: 100 ug via INTRAVENOUS
  Administered 2021-01-08: 25 ug via INTRAVENOUS
  Filled 2021-01-03: qty 100

## 2021-01-03 MED ORDER — AMIODARONE HCL 200 MG PO TABS: 200.0000 mg | ORAL_TABLET | Freq: Every day | ORAL | Status: DC

## 2021-01-03 MED ORDER — PROPOFOL 1000 MG/100ML IV EMUL
5.0000 ug/kg/min | INTRAVENOUS | Status: DC
  Administered 2021-01-03: 10 ug/kg/min via INTRAVENOUS
  Administered 2021-01-03 – 2021-01-04 (×2): 35 ug/kg/min via INTRAVENOUS
  Administered 2021-01-04: 20 ug/kg/min via INTRAVENOUS
  Administered 2021-01-04: 15 ug/kg/min via INTRAVENOUS
  Administered 2021-01-05 (×2): 40 ug/kg/min via INTRAVENOUS
  Administered 2021-01-05 (×2): 60 ug/kg/min via INTRAVENOUS
  Administered 2021-01-05: 25 ug/kg/min via INTRAVENOUS
  Administered 2021-01-05: 50 ug/kg/min via INTRAVENOUS
  Administered 2021-01-05 – 2021-01-06 (×6): 40 ug/kg/min via INTRAVENOUS
  Administered 2021-01-06: 50 ug/kg/min via INTRAVENOUS
  Administered 2021-01-06: 30 ug/kg/min via INTRAVENOUS
  Administered 2021-01-07: 20 ug/kg/min via INTRAVENOUS
  Administered 2021-01-07 (×2): 40 ug/kg/min via INTRAVENOUS
  Administered 2021-01-07: 30 ug/kg/min via INTRAVENOUS
  Administered 2021-01-08 – 2021-01-09 (×3): 10 ug/kg/min via INTRAVENOUS
  Filled 2021-01-03 (×25): qty 100

## 2021-01-03 MED ORDER — DOCUSATE SODIUM 50 MG/5ML PO LIQD
100.0000 mg | Freq: Two times a day (BID) | ORAL | Status: DC | PRN
  Administered 2021-01-05 – 2021-01-29 (×3): 100 mg
  Filled 2021-01-03 (×3): qty 10

## 2021-01-03 MED ORDER — AMIODARONE HCL 200 MG PO TABS
200.0000 mg | ORAL_TABLET | Freq: Every day | ORAL | Status: DC
  Administered 2021-01-04 – 2021-02-09 (×36): 200 mg
  Filled 2021-01-03 (×37): qty 1

## 2021-01-03 MED ORDER — PANTOPRAZOLE SODIUM 40 MG IV SOLR
40.0000 mg | Freq: Every day | INTRAVENOUS | Status: DC
  Administered 2021-01-04 – 2021-01-08 (×5): 40 mg via INTRAVENOUS
  Filled 2021-01-03 (×5): qty 40

## 2021-01-03 MED ORDER — DEXTROSE 5 % IV SOLN
250.0000 mg | INTRAVENOUS | Status: DC
  Administered 2021-01-04 – 2021-01-07 (×4): 250 mg via INTRAVENOUS
  Filled 2021-01-03 (×7): qty 250

## 2021-01-03 MED ORDER — SUCCINYLCHOLINE CHLORIDE 20 MG/ML IJ SOLN
150.0000 mg | Freq: Once | INTRAMUSCULAR | Status: AC
  Administered 2021-01-03: 150 mg via INTRAVENOUS

## 2021-01-03 MED ORDER — IPRATROPIUM-ALBUTEROL 0.5-2.5 (3) MG/3ML IN SOLN
3.0000 mL | RESPIRATORY_TRACT | Status: DC | PRN
  Administered 2021-01-24 – 2021-01-27 (×9): 3 mL via RESPIRATORY_TRACT
  Filled 2021-01-03 (×11): qty 3

## 2021-01-03 MED ORDER — FENTANYL CITRATE (PF) 100 MCG/2ML IJ SOLN
50.0000 ug | Freq: Once | INTRAMUSCULAR | Status: AC
Start: 2021-01-03 — End: 2021-01-03
  Administered 2021-01-03: 50 ug via INTRAVENOUS
  Filled 2021-01-03: qty 2

## 2021-01-03 MED ORDER — ATORVASTATIN CALCIUM 40 MG PO TABS
80.0000 mg | ORAL_TABLET | Freq: Every day | ORAL | Status: DC
  Administered 2021-01-04 – 2021-02-15 (×41): 80 mg
  Filled 2021-01-03 (×42): qty 2

## 2021-01-03 MED ORDER — ATORVASTATIN CALCIUM 40 MG PO TABS: 80.0000 mg | ORAL_TABLET | Freq: Every day | ORAL | Status: DC

## 2021-01-03 MED ORDER — FENTANYL CITRATE (PF) 100 MCG/2ML IJ SOLN: 50.0000 ug | Freq: Once | INTRAMUSCULAR | Status: AC

## 2021-01-03 MED ORDER — FENTANYL CITRATE (PF) 100 MCG/2ML IJ SOLN
INTRAMUSCULAR | Status: AC
  Administered 2021-01-03: 100 ug via INTRAVENOUS
  Filled 2021-01-03: qty 2

## 2021-01-03 MED ORDER — CHLORHEXIDINE GLUCONATE CLOTH 2 % EX PADS
6.0000 | MEDICATED_PAD | Freq: Every day | CUTANEOUS | Status: DC
  Administered 2021-01-04 – 2021-02-06 (×33): 6 via TOPICAL

## 2021-01-03 MED ORDER — SODIUM CHLORIDE 0.9 % IV SOLN: 2.0000 g | Freq: Every day | INTRAVENOUS | Status: DC

## 2021-01-03 MED ORDER — METHYLPREDNISOLONE SODIUM SUCC 125 MG IJ SOLR
125.0000 mg | Freq: Once | INTRAMUSCULAR | Status: AC
  Administered 2021-01-03: 125 mg via INTRAVENOUS
  Filled 2021-01-03: qty 2

## 2021-01-03 MED ORDER — FENTANYL CITRATE (PF) 100 MCG/2ML IJ SOLN
INTRAMUSCULAR | Status: AC
  Administered 2021-01-03: 50 ug via INTRAVENOUS
  Filled 2021-01-03: qty 2

## 2021-01-03 MED ORDER — FENTANYL 2500MCG IN NS 250ML (10MCG/ML) PREMIX INFUSION
25.0000 ug/h | INTRAVENOUS | Status: DC
Start: 2021-01-03 — End: 2021-01-10
  Administered 2021-01-03: 25 ug/h via INTRAVENOUS
  Administered 2021-01-04 (×2): 200 ug/h via INTRAVENOUS
  Administered 2021-01-05: 100 ug/h via INTRAVENOUS
  Administered 2021-01-07: 150 ug/h via INTRAVENOUS
  Administered 2021-01-07: 200 ug/h via INTRAVENOUS
  Filled 2021-01-03 (×7): qty 250

## 2021-01-03 MED ORDER — ETOMIDATE 2 MG/ML IV SOLN
20.0000 mg | Freq: Once | INTRAVENOUS | Status: AC
  Administered 2021-01-03: 20 mg via INTRAVENOUS

## 2021-01-03 MED ORDER — SODIUM CHLORIDE 0.9 % IV BOLUS: 1000.0000 mL | Freq: Once | INTRAVENOUS | Status: DC

## 2021-01-03 MED ORDER — FENTANYL CITRATE (PF) 100 MCG/2ML IJ SOLN: 100.0000 ug | Freq: Once | INTRAMUSCULAR | Status: AC

## 2021-01-03 MED ORDER — INSULIN ASPART 100 UNIT/ML IJ SOLN
0.0000 [IU] | INTRAMUSCULAR | Status: DC
  Administered 2021-01-04: 3 [IU] via SUBCUTANEOUS
  Administered 2021-01-04: 5 [IU] via SUBCUTANEOUS
  Administered 2021-01-04: 8 [IU] via SUBCUTANEOUS
  Administered 2021-01-04 (×3): 3 [IU] via SUBCUTANEOUS
  Administered 2021-01-04: 2 [IU] via SUBCUTANEOUS
  Administered 2021-01-05 (×3): 3 [IU] via SUBCUTANEOUS
  Administered 2021-01-05: 5 [IU] via SUBCUTANEOUS
  Administered 2021-01-05: 3 [IU] via SUBCUTANEOUS
  Administered 2021-01-06: 5 [IU] via SUBCUTANEOUS
  Administered 2021-01-06: 3 [IU] via SUBCUTANEOUS
  Administered 2021-01-06: 2 [IU] via SUBCUTANEOUS
  Administered 2021-01-06 (×3): 5 [IU] via SUBCUTANEOUS
  Administered 2021-01-07: 2 [IU] via SUBCUTANEOUS
  Administered 2021-01-07: 3 [IU] via SUBCUTANEOUS
  Administered 2021-01-07 (×2): 2 [IU] via SUBCUTANEOUS
  Administered 2021-01-08: 3 [IU] via SUBCUTANEOUS
  Administered 2021-01-08: 5 [IU] via SUBCUTANEOUS
  Administered 2021-01-08: 3 [IU] via SUBCUTANEOUS
  Administered 2021-01-08: 2 [IU] via SUBCUTANEOUS
  Administered 2021-01-08: 3 [IU] via SUBCUTANEOUS
  Administered 2021-01-08 (×2): 5 [IU] via SUBCUTANEOUS
  Administered 2021-01-09: 3 [IU] via SUBCUTANEOUS
  Administered 2021-01-09 (×3): 5 [IU] via SUBCUTANEOUS
  Administered 2021-01-09: 3 [IU] via SUBCUTANEOUS
  Administered 2021-01-09 – 2021-01-12 (×13): 5 [IU] via SUBCUTANEOUS
  Administered 2021-01-12: 15 [IU] via SUBCUTANEOUS
  Administered 2021-01-12 (×2): 3 [IU] via SUBCUTANEOUS
  Administered 2021-01-12: 5 [IU] via SUBCUTANEOUS
  Administered 2021-01-12: 8 [IU] via SUBCUTANEOUS
  Administered 2021-01-12: 5 [IU] via SUBCUTANEOUS
  Administered 2021-01-13: 11 [IU] via SUBCUTANEOUS
  Administered 2021-01-13 (×2): 8 [IU] via SUBCUTANEOUS
  Administered 2021-01-13: 15 [IU] via SUBCUTANEOUS
  Administered 2021-01-13 – 2021-01-14 (×4): 5 [IU] via SUBCUTANEOUS
  Administered 2021-01-14: 11 [IU] via SUBCUTANEOUS
  Administered 2021-01-14: 5 [IU] via SUBCUTANEOUS
  Administered 2021-01-14: 8 [IU] via SUBCUTANEOUS
  Administered 2021-01-15 (×2): 5 [IU] via SUBCUTANEOUS
  Administered 2021-01-15: 2 [IU] via SUBCUTANEOUS
  Administered 2021-01-15 (×2): 5 [IU] via SUBCUTANEOUS
  Administered 2021-01-16: 2 [IU] via SUBCUTANEOUS
  Administered 2021-01-16: 3 [IU] via SUBCUTANEOUS
  Administered 2021-01-16 – 2021-01-17 (×3): 2 [IU] via SUBCUTANEOUS
  Administered 2021-01-17: 5 [IU] via SUBCUTANEOUS
  Administered 2021-01-17 (×2): 3 [IU] via SUBCUTANEOUS
  Administered 2021-01-18 (×4): 2 [IU] via SUBCUTANEOUS
  Administered 2021-01-18: 3 [IU] via SUBCUTANEOUS
  Administered 2021-01-19: 2 [IU] via SUBCUTANEOUS
  Administered 2021-01-19 (×2): 3 [IU] via SUBCUTANEOUS
  Administered 2021-01-19: 2 [IU] via SUBCUTANEOUS
  Administered 2021-01-20: 5 [IU] via SUBCUTANEOUS
  Administered 2021-01-20 (×3): 3 [IU] via SUBCUTANEOUS
  Administered 2021-01-21 (×2): 2 [IU] via SUBCUTANEOUS
  Administered 2021-01-21 (×2): 3 [IU] via SUBCUTANEOUS
  Administered 2021-01-21: 2 [IU] via SUBCUTANEOUS
  Administered 2021-01-22: 3 [IU] via SUBCUTANEOUS
  Administered 2021-01-22 – 2021-01-24 (×11): 2 [IU] via SUBCUTANEOUS
  Administered 2021-01-24: 3 [IU] via SUBCUTANEOUS
  Administered 2021-01-24: 2 [IU] via SUBCUTANEOUS
  Administered 2021-01-25 (×2): 3 [IU] via SUBCUTANEOUS
  Administered 2021-01-25 (×2): 2 [IU] via SUBCUTANEOUS
  Administered 2021-01-25: 3 [IU] via SUBCUTANEOUS
  Administered 2021-01-26 – 2021-01-27 (×5): 2 [IU] via SUBCUTANEOUS
  Administered 2021-01-28: 3 [IU] via SUBCUTANEOUS
  Administered 2021-01-28 (×2): 2 [IU] via SUBCUTANEOUS
  Administered 2021-01-29: 8 [IU] via SUBCUTANEOUS
  Administered 2021-01-29 (×3): 2 [IU] via SUBCUTANEOUS
  Administered 2021-01-30: 8 [IU] via SUBCUTANEOUS
  Administered 2021-01-30 – 2021-01-31 (×3): 2 [IU] via SUBCUTANEOUS
  Administered 2021-01-31 (×2): 3 [IU] via SUBCUTANEOUS
  Administered 2021-01-31: 2 [IU] via SUBCUTANEOUS
  Administered 2021-01-31: 3 [IU] via SUBCUTANEOUS
  Administered 2021-02-01: 2 [IU] via SUBCUTANEOUS
  Administered 2021-02-01 (×3): 3 [IU] via SUBCUTANEOUS
  Administered 2021-02-01: 2 [IU] via SUBCUTANEOUS
  Administered 2021-02-02: 8 [IU] via SUBCUTANEOUS
  Administered 2021-02-02: 3 [IU] via SUBCUTANEOUS
  Administered 2021-02-02: 2 [IU] via SUBCUTANEOUS
  Administered 2021-02-02 (×2): 5 [IU] via SUBCUTANEOUS
  Administered 2021-02-03 (×2): 3 [IU] via SUBCUTANEOUS
  Administered 2021-02-03: 5 [IU] via SUBCUTANEOUS
  Administered 2021-02-03: 3 [IU] via SUBCUTANEOUS
  Administered 2021-02-03: 5 [IU] via SUBCUTANEOUS
  Administered 2021-02-04: 3 [IU] via SUBCUTANEOUS
  Administered 2021-02-04: 2 [IU] via SUBCUTANEOUS
  Administered 2021-02-04 – 2021-02-05 (×2): 5 [IU] via SUBCUTANEOUS
  Administered 2021-02-05 (×2): 3 [IU] via SUBCUTANEOUS
  Administered 2021-02-05 (×2): 2 [IU] via SUBCUTANEOUS
  Administered 2021-02-05: 3 [IU] via SUBCUTANEOUS
  Administered 2021-02-06 (×2): 5 [IU] via SUBCUTANEOUS
  Administered 2021-02-07: 2 [IU] via SUBCUTANEOUS
  Administered 2021-02-07 (×3): 5 [IU] via SUBCUTANEOUS
  Administered 2021-02-07: 2 [IU] via SUBCUTANEOUS
  Administered 2021-02-07 – 2021-02-08 (×2): 5 [IU] via SUBCUTANEOUS
  Administered 2021-02-08: 3 [IU] via SUBCUTANEOUS
  Administered 2021-02-08: 5 [IU] via SUBCUTANEOUS
  Administered 2021-02-08 (×2): 2 [IU] via SUBCUTANEOUS
  Administered 2021-02-09: 3 [IU] via SUBCUTANEOUS
  Administered 2021-02-09: 5 [IU] via SUBCUTANEOUS
  Administered 2021-02-09: 3 [IU] via SUBCUTANEOUS
  Administered 2021-02-09: 5 [IU] via SUBCUTANEOUS
  Administered 2021-02-09 (×3): 2 [IU] via SUBCUTANEOUS
  Administered 2021-02-10 (×3): 5 [IU] via SUBCUTANEOUS
  Administered 2021-02-11: 2 [IU] via SUBCUTANEOUS
  Administered 2021-02-11: 5 [IU] via SUBCUTANEOUS
  Administered 2021-02-11: 3 [IU] via SUBCUTANEOUS
  Administered 2021-02-11: 5 [IU] via SUBCUTANEOUS
  Administered 2021-02-11 (×2): 3 [IU] via SUBCUTANEOUS
  Administered 2021-02-12: 2 [IU] via SUBCUTANEOUS
  Administered 2021-02-12: 3 [IU] via SUBCUTANEOUS
  Administered 2021-02-12: 2 [IU] via SUBCUTANEOUS
  Administered 2021-02-12: 5 [IU] via SUBCUTANEOUS
  Administered 2021-02-12: 2 [IU] via SUBCUTANEOUS
  Administered 2021-02-12: 5 [IU] via SUBCUTANEOUS
  Administered 2021-02-12: 3 [IU] via SUBCUTANEOUS
  Administered 2021-02-13: 2 [IU] via SUBCUTANEOUS
  Administered 2021-02-13 (×2): 3 [IU] via SUBCUTANEOUS
  Administered 2021-02-13: 2 [IU] via SUBCUTANEOUS
  Administered 2021-02-13 – 2021-02-14 (×5): 3 [IU] via SUBCUTANEOUS
  Administered 2021-02-14: 2 [IU] via SUBCUTANEOUS
  Administered 2021-02-14 (×2): 5 [IU] via SUBCUTANEOUS
  Administered 2021-02-15: 3 [IU] via SUBCUTANEOUS
  Administered 2021-02-15: 2 [IU] via SUBCUTANEOUS
  Administered 2021-02-15: 5 [IU] via SUBCUTANEOUS
  Administered 2021-02-15: 3 [IU] via SUBCUTANEOUS

## 2021-01-03 MED ORDER — LEVETIRACETAM 100 MG/ML PO SOLN
500.0000 mg | Freq: Two times a day (BID) | ORAL | Status: DC
  Administered 2021-01-04: 500 mg
  Filled 2021-01-03: qty 5

## 2021-01-03 MED ORDER — LEVOTHYROXINE SODIUM 25 MCG PO TABS
175.0000 ug | ORAL_TABLET | Freq: Every day | ORAL | Status: DC
  Administered 2021-01-04 – 2021-02-15 (×41): 175 ug
  Filled 2021-01-03 (×42): qty 1

## 2021-01-03 MED ORDER — POLYETHYLENE GLYCOL 3350 17 G PO PACK
17.0000 g | PACK | Freq: Every day | ORAL | Status: DC | PRN
  Administered 2021-01-05 – 2021-01-09 (×2): 17 g
  Filled 2021-01-03 (×2): qty 1

## 2021-01-03 MED ORDER — HEPARIN (PORCINE) 25000 UT/250ML-% IV SOLN
1200.0000 [IU]/h | INTRAVENOUS | Status: DC
  Administered 2021-01-04: 1200 [IU]/h via INTRAVENOUS
  Filled 2021-01-03: qty 250

## 2021-01-03 MED ORDER — SODIUM CHLORIDE 0.9 % IV SOLN
500.0000 mg | Freq: Once | INTRAVENOUS | Status: AC
  Administered 2021-01-03: 500 mg via INTRAVENOUS
  Filled 2021-01-03: qty 500

## 2021-01-03 MED ORDER — GABAPENTIN 250 MG/5ML PO SOLN
100.0000 mg | Freq: Three times a day (TID) | ORAL | Status: DC
  Administered 2021-01-04 – 2021-02-15 (×120): 100 mg
  Filled 2021-01-03 (×132): qty 2

## 2021-01-03 MED ORDER — SODIUM CHLORIDE 0.9 % IV SOLN
2.0000 g | Freq: Three times a day (TID) | INTRAVENOUS | Status: DC
  Administered 2021-01-04 – 2021-01-05 (×5): 2 g via INTRAVENOUS
  Filled 2021-01-03 (×5): qty 2

## 2021-01-03 MED ORDER — VANCOMYCIN HCL 1500 MG/300ML IV SOLN
1500.0000 mg | Freq: Once | INTRAVENOUS | Status: AC
  Administered 2021-01-04: 1500 mg via INTRAVENOUS
  Filled 2021-01-03: qty 300

## 2021-01-03 NOTE — H&P (Signed)
NAME:  [name], MRN:  [id number], DOB:  December 23, 1950, LOS: 0 ADMISSION DATE:  01/03/2021, CONSULTATION DATE:  01/03/21 REFERRING MD:  EDP at [facility], CHIEF COMPLAINT:  SOBencephalopathy   History of Present Illness:  This is a 70 yo female with history of obesity, T2DM, chronic a fib on eliquis, dementia, diastolic HF, with chornic hypoxemic repsiratory failure, seizure disorder, CKD3, CAD with prior NSTEMI, Pulm HTN, hypothyroidism, and Dyslipidemia who presented for AMS x several days, increased WOB and SOB. O2 sat noted to 65% per EMS. Placed on non rebreather and O2 incrased to 100%. PC02 noted to 72. Patietn placed on bipap. Pateint non cooperative with bipap. Thus patient was intubated.   At baseline on 2L [place] at home.   APED workup was remarkable for reassuring creatinine and no gross electrolyte disturbaces, CBC with no leukocytosis, stable hgb of 9.1, covid negative, BNP of 614 increased from 2 weeks ago at 453, trop of 108, ABG with ph of 7.259 and pCO2 72,PAO2 71.3.   Of note patient just recently admitted from 6/20-6/29 for acute on chronic hypoxic/hypercapnic respiratory failure 2/2 CHF exacerbations.   CXR with similar appearence as previous-bilateral hazy opacities more central. Positive fissure sign and blunting of costophrenic angles likely reflecting effusions.   EKG with TWI in lead 1, RBB and LAFB. These were present on previous EKG.   Initially hypercarbic on abg today prior to intubation.  In ED received solumedrol 125  Pertinent  Medical History  obesity, type 2 diabetes,  chronic atrial fibrillation on Eliquis dementia diastolic CHF with chronic hypoxemia on 2 L nasal cannula  hypothyroidism seizure disorder CKD stage III CAD with prior NSTEMI,  dyslipidemia,  GERD NSTEMI 08/2020  Significant Hospital Events: Including procedures, antibiotic start and stop dates in addition to other pertinent events   Admitted to ICU on 01/03/21  Interim History /  Subjective:  NA  Objective   Blood pressure (!) 120/110, pulse 94, temperature 98.3 F (36.8 C), temperature source Oral, resp. rate 15, height 5\' 8"  (1.727 m), weight 106.1 kg, SpO2 97 %.    Vent Mode: PRVC FiO2 (%):  [50 %] 50 % Set Rate:  [20 bmp] 20 bmp Vt Set:  [510 mL] 510 mL PEEP:  [5 cmH20] 5 cmH20 Plateau Pressure:  [28 cmH20] 28 cmH20  No intake or output data in the 24 hours ending 01/03/21 1856 Filed Weights   01/03/21 1109  Weight: 106.1 kg    Examination: General: NAD, intubated  HENT: NCAT, Pupils 2 mm Lungs: CTAB anteriorly  Cardiovascular: afib hr 80s Abdomen: nt, nd, nbs  Extremities: small eccymoses on feet/toes, heels with adhesive bandages  Neuro: sedated  GU: foley   EchoL:  1. Left ventricular ejection fraction, by estimation, is 55 to 60%. The  left ventricle has normal function. There is moderate left ventricular  hypertrophy. Left ventricular diastolic parameters are indeterminate.   2. Right ventricular systolic function is normal. The right ventricular  size is normal. There is severely elevated pulmonary artery systolic  pressure.   3. Left atrial size was severely dilated.   4. Right atrial size was severely dilated.   5. Mild mitral valve regurgitation. No evidence of mitral stenosis.  Moderate mitral annular calcification.   6. The tricuspid valve is abnormal. Tricuspid valve regurgitation is  moderate.   7. The aortic valve is tricuspid. There is mild calcification of the  aortic valve. There is mild thickening of the aortic valve. Aortic valve  regurgitation is not visualized. No aortic stenosis is present.   8. Severe pulmonary HTN, PASP is 71 mmHg.   9. The inferior vena cava is dilated in size with <50% respiratory  variability, suggesting right atrial pressure of 15 mmHg.  10. Limited echo  Resolved Hospital Problem list   NA  Assessment & Plan:  This is a 70 yo female who presents with AMS. Found to be in acute on chronic  hypoxic and hypercapnic respiratory failure.  Acute combined hypoxic and hypercapnic respiratory failure- No COPD noted on previous PFT's-Ratio always preseverd however impaired FEV1 and FVC suggesting restrictive lung disease.  Was hypotensive, hold off on diureiss for now, consider if stabilizes.  CT chest with increasing consolidation, som GGO.  Broad spectrum abtx for PNA.  Check procal.  PHTN may be contributing to hypoxia.  Suspect component of obesity hypoventilation contributing to hypercarbia?  Appears to have chronic component.  Echo repeat P.   Encephalopathy-Likely 2/2 respiratroy failure, could be infectious.   CT head -Attain UA  -Attain blood cultures -Attain sputum cultures  A fib on eliquis- -Continue amiodarone 200 mg daily -heparin for now, can transition to eliquis when more stable  Reflux -Continue home protonix 40 mg BID  DM2-  sliding scale for now while npo   Mood disorder -takes cymbalta 30 BID  Chronic pain-gabapenin TID  Pulm HTN-RVSP in 70's very large PA. RVSP on echo 5/22 Unclear what work up has been done.   Hypothyroidism-125mcg daily -Cotninue home synthroid -Attain TSH  History of seizures -Continue home Keppra 1000mg  daily  Hypoalb  Trop elevation, NSTEMI   Recent ecoli UTI [] ua     Best Practice (right click and "Reselect all SmartList Selections" daily)   Diet/type: NPO DVT prophylaxis: DOAC GI prophylaxis: PPI Lines: N/A Foley:  Yes, and it is still needed Code Status:  full code Last date of multidisciplinary goals of care discussion [TBD]  Labs   CBC: Recent Labs  Lab 12/28/20 0430 12/29/20 0442 12/30/20 0035 01/03/21 1255  WBC 8.1 8.9 7.4 9.6  HGB 8.2* 8.4* 8.1* 9.1*  HCT 27.6* 29.1* 27.9* 31.6*  MCV 95.2 95.7 96.5 99.1  PLT 181 203 210 243    Basic Metabolic Panel: Recent Labs  Lab 12/28/20 0430 12/29/20 0442 01/03/21 1255  NA 145 144 139  K 3.1* 3.3* 4.9  CL 109 108 103  CO2 29 29  31   GLUCOSE 221* 198* 287*  BUN 14 13 19   CREATININE 0.91 0.79 1.00  CALCIUM 8.5* 8.8* 8.5*  MG  --  2.2  --    GFR: Estimated Creatinine Clearance: 65.8 mL/min (by C-G formula based on SCr of 1 mg/dL). Recent Labs  Lab 12/28/20 0430 12/29/20 0442 12/30/20 0035 01/03/21 1255  WBC 8.1 8.9 7.4 9.6    Liver Function Tests: Recent Labs  Lab 12/28/20 0430 12/29/20 0442 01/03/21 1255  AST 29 23 24   ALT 9 7 9   ALKPHOS 75 86 89  BILITOT 0.5 0.7 0.4  PROT 5.4* 5.7* 6.4*  ALBUMIN 2.8* 2.9* 3.2*   No results for input(s): LIPASE, AMYLASE in the last 168 hours. No results for input(s): AMMONIA in the last 168 hours.  ABG    Component Value Date/Time   PHART 7.259 (L) 01/03/2021 1537   PCO2ART 72.0 (HH) 01/03/2021 1537   PO2ART 71.3 (L) 01/03/2021 1537   HCO3 27.6 01/03/2021 1537   TCO2 27 09/06/2020 1238   ACIDBASEDEF 1.0 01/26/2015 1132   O2SAT 90.2 01/03/2021  1537     Coagulation Profile: No results for input(s): INR, PROTIME in the last 168 hours.  Cardiac Enzymes: No results for input(s): CKTOTAL, CKMB, CKMBINDEX, TROPONINI in the last 168 hours.  HbA1C: Hgb A1c MFr Bld  Date/Time Value Ref Range Status  11/22/2020 04:03 AM 7.6 (H) 4.8 - 5.6 % Final    Comment:    (NOTE) Pre diabetes:          5.7%-6.4%  Diabetes:              >6.4%  Glycemic control for   <7.0% adults with diabetes   08/24/2020 10:02 AM 7.9 (H) 4.8 - 5.6 % Final    Comment:    (NOTE) Pre diabetes:          5.7%-6.4%  Diabetes:              >6.4%  Glycemic control for   <7.0% adults with diabetes     CBG: Recent Labs  Lab 12/29/20 2037 12/30/20 0027 12/30/20 0409 12/30/20 0803 12/30/20 1114  GLUCAP 115* 146* 89 134* 160*    Review of Systems:   Unable to assess  Past Medical History:  She,  has a past medical history of Abnormal pulmonary function test, Anemia, Anxiety, Arthritis, Barrett's esophagus, Chest pain, Chronic anticoagulation, Chronic combined systolic and  diastolic CHF (congestive heart failure) (HCC), Chronic LBP, Diabetes mellitus, type 2 (HCC), Dysrhythmia, Gastroparesis, GERD (gastroesophageal reflux disease), Heart attack (HCC) (08/13/2020), Hiatal hernia, Hyperlipidemia, Hypertension, Hypothyroid, IBS (irritable bowel syndrome), Obesity, OSA on CPAP, Paroxysmal atrial fibrillation (HCC), Pulmonary hypertension (HCC) (01/2015), Seizures (HCC), and Syncope.   Surgical History:   Past Surgical History:  Procedure Laterality Date   BACK SURGERY  1996   BIOPSY N/A 11/08/2013   Procedure: BIOPSY  / Tissue sampling / ulcers present in small intestine;  Surgeon: West BaliSandi L Fields, MD;  Location: [facility];  Service: Endoscopy;  Laterality: N/A;   CARDIAC CATHETERIZATION  2002   CARDIAC CATHETERIZATION N/A 01/26/2015   Procedure: Right Heart Cath;  Surgeon: Laurey Moralealton S McLean, MD;  [facility];  Service: Cardiovascular;  Laterality: N/A;   CARDIOVASCULAR STRESS TEST  2008   Stress nuclear study   CARDIOVERSION N/A 03/06/2015   Procedure: CARDIOVERSION;  Surgeon: Laurey Moralealton S McLean, MD;  [facility];  Service: Cardiovascular;  Laterality: N/A;   CARPAL TUNNEL RELEASE  1994   COLONOSCOPY  11/2011   Dr. Darrick Pennafields: Internal hemorrhoids, mild diverticulosis. Random colon biopsies negative.   COLONOSCOPY N/A 11/08/2013   SLF: Normal mucosa in the terminal ileum/The colon IS redundant/  Moderate diverticulosis throughout the entire colon. ileum bx benign. colon bx benign   ESOPHAGOGASTRODUODENOSCOPY  2008   Barrett's without dysplasia. esphagus dilated. antral erosions, h.pylori serologies negative.   ESOPHAGOGASTRODUODENOSCOPY  11/2011   Dr. Darrick PennaFields: Barrett's esophagus, mild gastritis, diverticulum in the second portion of the duodenum repeat EGD 3 years. Small bowel biopsies negative. Gastric biopsy show reactive gastropathy but no H. pylori. Esophageal biopsies consistent with GERD. Next EGD 11/2014   ESOPHAGOGASTRODUODENOSCOPY N/A  11/21/2014   ZOX:WRUESLF:mild non-erosive gastritis/irregular z-line   GIVENS CAPSULE STUDY  12/07/2011   Proximal small bowel, rare AVM. Distal small bowel, multiple ulcers noted   GIVENS CAPSULE STUDY N/A 09/27/2013   Distal small bowel ulcers extending to TI.   GIVENS CAPSULE STUDY N/A 10/10/2013   Procedure: GIVENS CAPSULE STUDY;  Surgeon: West BaliSandi L Fields, MD;  Location: [facility];  Service: Endoscopy;  Laterality: N/A;  7:30   IR KYPHO THORACIC WITH BONE BIOPSY  02/09/2018   KNEE ARTHROSCOPY WITH MEDIAL MENISECTOMY Right 06/09/2016   Procedure: KNEE ARTHROSCOPY WITH MEDIAL MENISECTOMY;  Surgeon: Vickki Hearing, MD;  Location: [facility] ORS;  Service: Orthopedics;  Laterality: Right;  medial and lateral menisectomy   LAMINECTOMY  1995   L4-L5   LAPAROSCOPIC CHOLECYSTECTOMY  1990s   LEFT HEART CATHETERIZATION WITH CORONARY ANGIOGRAM  01/10/2014   Procedure: LEFT HEART CATHETERIZATION WITH CORONARY ANGIOGRAM;  Surgeon: Lesleigh Noe, MD;  Location: [facility] CATH LAB;  Service: Cardiovascular;;   RIGHT HEART CATHETERIZATION N/A 01/10/2014   Procedure: RIGHT HEART CATH;  Surgeon: Lesleigh Noe, MD;  Location: [facility] CATH LAB;  Service: Cardiovascular;  Laterality: N/A;   TOTAL ABDOMINAL HYSTERECTOMY  1999   TRACHEOSTOMY TUBE PLACEMENT N/A 09/01/2017   Procedure: TRACHEOSTOMY;  Surgeon: Drema Halon, MD;  Location: [facility];  Service: ENT;  Laterality: N/A;     Social History:   reports that she quit smoking about 37 years ago. Her smoking use included cigarettes. She started smoking about 54 years ago. She has a 3.75 pack-year smoking history. She has never used smokeless tobacco. She reports that she does not drink alcohol and does not use drugs.   Family History:  Her family history includes Alzheimer's disease in her mother; Breast cancer in her sister; Heart attack in her mother; Hypertension in her mother and another family member; Stroke in her mother. There is no history of Heart disease or Colon  cancer.   Allergies Allergies  Allergen Reactions   Citalopram Hydrobromide Other (See Comments)    Dyskinesia Other reaction(s): Other (See Comments) Dyskinesia   Codeine Nausea And Vomiting and Other (See Comments)    HALLUCINATIONS Other reaction(s): Unknown Other reaction(s): Other (See Comments) HALLUCINATIONS    Hydromorphone Hcl Other (See Comments)    Made her pass out Other reaction(s): Other (See Comments) Made her pass out   Metoclopramide Other (See Comments)    DYSKINESIA Other reaction(s): Other (See Comments) DYSKINESIA   Hyoscyamine Other (See Comments)    MADE DIARRHEA WORSE     Home Medications  Prior to Admission medications   Medication Sig Start Date End Date Taking? Authorizing Provider  acetaminophen (TYLENOL) 500 MG tablet Take 500 mg by mouth as needed for moderate pain or headache.    Yes [provider]  amiodarone (PACERONE) 200 MG tablet TAKE 1 TABLET BY MOUTH EVERY MORNING Patient taking differently: Take 200 mg by mouth every morning. 03/03/20  Yes Strader, [place] M, PA-C  apixaban (ELIQUIS) 5 MG TABS tablet Take 1 tablet (5 mg total) by mouth 2 (two) times daily. 02/14/20  Yes BranchDorothe Pea, MD  atorvastatin (LIPITOR) 80 MG tablet Place 1 tablet (80 mg total) into feeding tube daily. Patient taking differently: Take 80 mg by mouth daily. 09/22/20  Yes Russella Dar, NP  Cholecalciferol (VITAMIN D3) 2000 units capsule Take 2,000 Units by mouth daily. 10/25/17  Yes [provider]  DULoxetine (CYMBALTA) 30 MG capsule Take 30 mg by mouth 2 (two) times daily. 11/24/17  Yes [provider]  feeding supplement (ENSURE ENLIVE / ENSURE PLUS) LIQD Take 237 mLs by mouth 3 (three) times daily between meals. 09/21/20  Yes Russella Dar, NP  furosemide (LASIX) 20 MG tablet Take 1 tablet (20 mg total) by mouth daily. 09/22/20  Yes Russella Dar, NP  gabapentin (NEURONTIN) 100 MG capsule Take 1 capsule (100 mg total) by  mouth 3 (three) times daily. 09/21/20  Yes Russella Dar, NP  insulin glargine (LANTUS) 100 UNIT/ML injection Inject 0.2 mLs (20 Units total) into the skin daily. 12/30/20  Yes TatOnalee Hua, MD  Lactase 9000 units CHEW Chew 1 tablet (9,000 Units total) by mouth every 4 (four) hours as needed (when eating dairy products). 12/30/20  Yes Tat, Onalee Hua, MD  levETIRAcetam (KEPPRA) 500 MG tablet Take 2 tablets (1,000 mg total) by mouth daily. 12/30/20  Yes Tat, Onalee Hua, MD  levothyroxine (SYNTHROID) 175 MCG tablet Take 1 tablet by mouth daily. 05/20/19  Yes [provider]  loperamide (IMODIUM) 2 MG capsule 1 PO Q4H PRN LOOSE STOOLS Patient taking differently: Take 2 mg by mouth every 4 (four) hours as needed for diarrhea or loose stools. 09/05/19  Yes Fields, Sandi L, MD  LORazepam (ATIVAN) 0.5 MG tablet Take 0.5 mg by mouth every 8 (eight) hours as needed for anxiety or sedation.   Yes [provider]  meclizine (ANTIVERT) 25 MG tablet Take 1 tablet (25 mg total) by mouth every 8 (eight) hours as needed for dizziness. 10/16/20  Yes BranchDorothe Pea, MD  Multiple Vitamin (MULTIVITAMIN WITH MINERALS) TABS tablet Take 1 tablet by mouth daily.   Yes [provider]  OXYGEN 2 L daily.    Yes [provider]  pantoprazole (PROTONIX) 40 MG tablet TAKE ONE TABLET BY MOUTH TWICE DAILY BEFORE APPLY MEAL Patient taking differently: Take 40 mg by mouth 2 (two) times daily before a meal. 02/07/17  Yes Gelene Mink, NP  potassium chloride (KLOR-CON) 10 MEQ tablet Take 20 mEq by mouth daily.   Yes [provider]  SM MELATONIN 3 MG TABS tablet Take 3 mg by mouth at bedtime as needed for sleep. 08/22/20  Yes [provider]  ondansetron (ZOFRAN) 4 MG tablet TAKE 1 TABLET BY MOUTH FOUR TIMES DAILY AS NEEDED FOR NAUSEA Patient taking differently: Take 4 mg by mouth 4 (four) times daily as needed for nausea. 01/10/18   Gelene Mink, NP     Critical care time: 55 minutes

## 2021-01-03 NOTE — Progress Notes (Signed)
eLink Physician-Brief Progress Note Patient Name: [name] DOB: 1950-10-01 MRN: [id number]   Date of Service  01/03/2021  HPI/Events of Note  Patient arrives in transfer from [facility] intubated and ventilated. Nursing request for Propofol IV infusion and bilateral soft wrist restraint. Order for Propofol IV infusion already exists.  eICU Interventions  Will order bilateral soft wrist restraints X 12 hours.     Intervention Category Major Interventions: Delirium, psychosis, severe agitation - evaluation and management  [name] 01/03/2021, 9:44 PM

## 2021-01-03 NOTE — ED Notes (Signed)
Patient attempting to take bipap off at this time. This RN attempted to redirect pt to keep bipap on. Pt readjusted in bed at this time. After being readjusted, pt began to pull bipap off again. While this RN was attempting to fix mask, pt grabbed this RN's hand. This RN moved hand away and daughter yelled "you could be more gentle, I mean seriously." This RN explained that it was an accident and that I was not purposely being aggressive. Pt redirected to keep mask on and readjusted in bed. This RN left room and will continue to monitor.

## 2021-01-03 NOTE — ED Triage Notes (Signed)
Pt brought to ED via [facility] for AMS. O2 sats initially 65%, placed on NR O2 increased to 100%, now on 4L [place], pt is now alert and oriented. Pt CO2 level has been fluctuating per EMS. CBG 333.

## 2021-01-03 NOTE — Progress Notes (Signed)
Pt transported to and from CT without event.  

## 2021-01-03 NOTE — ED Notes (Addendum)
Date and time results received: 01/03/21 3:46 PM    Test: Arterial Blood Gas Critical Value: PCO2 72.0  Name of Provider Notified: Dr. Charm Barges  Orders Received? Or Actions Taken?see orders

## 2021-01-03 NOTE — ED Notes (Signed)
Blood gas drawn at 1944 maybe mixed sample.

## 2021-01-03 NOTE — ED Provider Notes (Signed)
[facility] EMERGENCY DEPARTMENT Provider Note   CSN: [id number] Arrival date & time: 01/03/21  1100     History Chief Complaint  Patient presents with   Altered Mental Status    MAZIE FENCL is a 70 y.o. female.  Level 5 caveat secondary to altered mental status and dementia.  She is brought in by her daughter for a few days of increased confusion.  She is normally on 2 L nasal cannula but has been more short of breath and daughter increased her to 4 L.  No cough or vomiting.  She was recently admitted and intubated for hypercarbia.  Daughter states the way she looks now was similar to her last admission.  Daughter is also concerned because the patient had her seizure meds adjusted and wants to know if she is having a seizure.  The history is provided by the patient and a relative.  Altered Mental Status Presenting symptoms: confusion and lethargy   Severity:  Moderate Most recent episode:  More than 2 days ago Episode history:  Continuous Duration:  4 days Timing:  Constant Progression:  Worsening Chronicity:  Recurrent Context: dementia and recent illness   Associated symptoms: difficulty breathing   Associated symptoms: no abdominal pain, no fever, no nausea and no vomiting       Past Medical History:  Diagnosis Date   Abnormal pulmonary function test    Anemia    H/H of 10/30 with a normal MCV in 12/09   Anxiety    Arthritis    Barrett's esophagus    Diagnosed 1995. Last EGD 2016-NO BARRETT'S.    Chest pain    Negative cardiac catheterization in 2002; negative stress nuclear study in 2008   Chronic anticoagulation    Chronic combined systolic and diastolic CHF (congestive heart failure) ([place])    a. EF predominantly normal during prior echoes but was 40% during 10/2014 echo. b. Most recent 01/2015 EF normal, 55-60%.   Chronic LBP    Surgical intervention in 1996   Diabetes mellitus, type 2 (HCC)    Insulin therapy; exacerbated by prednisone   Dysrhythmia    AFib    Gastroparesis    99% retention 05/2008 on GES   GERD (gastroesophageal reflux disease)    Heart attack ([place]) 08/13/2020   Hiatal hernia    Hyperlipidemia    Hypertension    Hypothyroid    IBS (irritable bowel syndrome)    Obesity    OSA on CPAP    had CPAP and cannot tolerate.   Paroxysmal atrial fibrillation ([place])    Pulmonary hypertension ([place]) 01/2015   a. Predominantly pulmonary venous hypertension but may be component of PAH.   Seizures ([place])    last seizure was 2 years ago; on keppra for this; unknown etiology   Syncope    a. Admitted 05/2009; magnetic resonance imagin/ MRA - negative; etiology thought to be orthostasis secondary to drugs and dehydration. b. Syncope 02/2015 also felt 2/2 dehydration.    Patient Active Problem List   Diagnosis Date Noted   Elevated troponin    Acute hypoxemic respiratory failure ([place]) 12/21/2020   Acute on chronic respiratory failure with hypoxia and hypercapnia (HCC)    Endotracheal tube present    Acute on chronic respiratory failure with hypoxia ([place]) 11/22/2020   Altered mental status 11/21/2020   Hypoalbuminemia due to protein-calorie malnutrition ([place]) 11/21/2020   Pressure ulcer 65/09/5463   Acute metabolic encephalopathy 68/06/7516   Acute ischemic stroke ([place])  Respiratory failure with hypoxia ([place]) 08/24/2020   Lobar pneumonia ([place]) 08/24/2020   Septic shock ([place]) 08/24/2020   NSTEMI (non-ST elevated myocardial infarction) ([place]) 08/17/2020   Constipation 03/17/2020   Knee pain 10/07/2019   Fibromyalgia 10/07/2019   Polyarthropathy 10/07/2019   Fibromyositis 09/30/2019   Abnormal gait due to muscle weakness 09/30/2019   Pain in right knee 09/30/2019   Depression 08/31/2018   Anxiety 08/31/2018   Chronic respiratory failure with hypoxia and hypercapnia (HCC) 17/49/4496   Acute systolic HF (heart failure) ([place]) 11/26/2017   Palliative care by specialist    Goals of care, counseling/discussion    Acute on chronic  diastolic CHF (congestive heart failure) ([place]) 08/17/2017   Acute respiratory failure with hypoxia and hypercapnia ([place]) 08/16/2017   Uncontrolled type 2 diabetes mellitus with hyperglycemia ([place]) 08/16/2017   Acute respiratory failure ([place])    CAP (community acquired pneumonia) 08/05/2017   COPD exacerbation ([place]) 08/05/2017   Pulmonary hypertension ([place]) 04/05/2017   Derangement of posterior horn of medial meniscus of right knee    Lateral meniscus, anterior horn derangement, right    Syncope, near 12/26/2015   Fall at home 12/26/2015   Dysphagia 12/07/2015   IDA (iron deficiency anemia) 03/13/2015   Chronic diastolic CHF (congestive heart failure) ([place]) 02/20/2015   Diffuse abdominal pain 09/16/2014   Ascites 08/27/2014   Macrocytic anemia 07/20/2014   Epistaxis, recurrent 07/20/2014   Seizure ([place]) 04/11/2014   Dementia ([place]) 04/11/2014   Chronic diarrhea 04/11/2014   DM type 2 (diabetes mellitus, type 2) ([place]) 04/11/2014   Protein-calorie malnutrition, severe ([place]) 04/11/2014   Atrial fibrillation with RVR ([place]) 12/21/2013   Abnormal weight loss 08/26/2013   Lower abdominal pain 08/26/2013   Anorexia nervosa 08/26/2013   Encounter for therapeutic drug monitoring 08/26/2013   DOE (dyspnea on exertion) 06/29/2013   Tremor 10/27/2012   Chronic anticoagulation 06/23/2011   HYPOTENSION, ORTHOSTATIC 08/26/2009   Hyperlipidemia 03/04/2009   Barrett's esophagus 09/26/2008   Gastroparesis 09/26/2008   Iron deficiency anemia 09/25/2008   Essential hypertension 09/25/2008   Atelectasis 09/25/2008   Irritable bowel syndrome 09/25/2008   Sleep apnea 09/25/2008   CARPAL TUNNEL SYNDROME, HX OF 09/25/2008   Hypothyroidism 04/10/2008   Body mass index (BMI) 40.0-44.9, adult ([place]) 04/10/2008   Atrial fibrillation ([place]) 04/10/2008   Acute exacerbation of CHF (congestive heart failure) ([place]) 04/10/2008   GERD (gastroesophageal reflux disease) 04/10/2008   Low back pain 04/10/2008    CHEST PAIN-PRECORDIAL 04/10/2008   Obesity 04/10/2008    Past Surgical History:  Procedure Laterality Date   BACK SURGERY  1996   BIOPSY N/A 11/08/2013   Procedure: BIOPSY  / Tissue sampling / ulcers present in small intestine;  Surgeon: Danie Binder, MD;  Location[facility];  Service: Endoscopy;  Laterality: N/A;   CARDIAC CATHETERIZATION  2002   CARDIAC CATHETERIZATION N/A 01/26/2015   Procedure: Right Heart Cath;  Surgeon: Larey Dresser, MD;  Location: [facility] CV LAB;  Service: Cardiovascular;  Laterality: N/A;   CARDIOVASCULAR STRESS TEST  2008   Stress nuclear study   CARDIOVERSION N/A 03/06/2015   Procedure: CARDIOVERSION;  Surgeon: Larey Dresser, MD;  Location: [facility];  Service: Cardiovascular;  Laterality: N/A;   CARPAL TUNNEL RELEASE  1994   COLONOSCOPY  11/2011   Dr. Oneida Alar: Internal hemorrhoids, mild diverticulosis. Random colon biopsies negative.   COLONOSCOPY N/A 11/08/2013   SLF: Normal mucosa in the terminal ileum/The colon IS redundant/  Moderate diverticulosis throughout the  entire colon. ileum bx benign. colon bx benign   ESOPHAGOGASTRODUODENOSCOPY  2008   Barrett's without dysplasia. esphagus dilated. antral erosions, h.pylori serologies negative.   ESOPHAGOGASTRODUODENOSCOPY  11/2011   Dr. Oneida Alar: Barrett's esophagus, mild gastritis, diverticulum in the second portion of the duodenum repeat EGD 3 years. Small bowel biopsies negative. Gastric biopsy show reactive gastropathy but no H. pylori. Esophageal biopsies consistent with GERD. Next EGD 11/2014   ESOPHAGOGASTRODUODENOSCOPY N/A 11/21/2014   JJK:KXFG non-erosive gastritis/irregular z-line   GIVENS CAPSULE STUDY  12/07/2011   Proximal small bowel, rare AVM. Distal small bowel, multiple ulcers noted   GIVENS CAPSULE STUDY N/A 09/27/2013   Distal small bowel ulcers extending to TI.   GIVENS CAPSULE STUDY N/A 10/10/2013   Procedure: GIVENS CAPSULE STUDY;  Surgeon: Danie Binder, MD;  Location: [facility];   Service: Endoscopy;  Laterality: N/A;  7:30   IR KYPHO THORACIC WITH BONE BIOPSY  02/09/2018   KNEE ARTHROSCOPY WITH MEDIAL MENISECTOMY Right 06/09/2016   Procedure: KNEE ARTHROSCOPY WITH MEDIAL MENISECTOMY;  Surgeon: Carole Civil, MD;  Location: [facility] ORS;  Service: Orthopedics;  Laterality: Right;  medial and lateral menisectomy   LAMINECTOMY  1995   L4-L5   LAPAROSCOPIC CHOLECYSTECTOMY  1990s   LEFT HEART CATHETERIZATION WITH CORONARY ANGIOGRAM  01/10/2014   Procedure: LEFT HEART CATHETERIZATION WITH CORONARY ANGIOGRAM;  Surgeon: Sinclair Grooms, MD;  Location: [facility] CATH LAB;  Service: Cardiovascular;;   RIGHT HEART CATHETERIZATION N/A 01/10/2014   Procedure: RIGHT HEART CATH;  Surgeon: Sinclair Grooms, MD;  Location: [facility] CATH LAB;  Service: Cardiovascular;  Laterality: N/A;   TOTAL ABDOMINAL HYSTERECTOMY  1999   TRACHEOSTOMY TUBE PLACEMENT N/A 09/01/2017   Procedure: TRACHEOSTOMY;  Surgeon: Rozetta Nunnery, MD;  Location: [facility] OR;  Service: ENT;  Laterality: N/A;     OB History     Gravida  2   Para  2   Term  2   Preterm      AB      Living         SAB      IAB      Ectopic      Multiple      Live Births              Family History  Problem Relation Age of Onset   Hypertension Mother    Alzheimer's disease Mother    Stroke Mother    Heart attack Mother    Hypertension Other    Breast cancer Sister    Heart disease Neg Hx    Colon cancer Neg Hx     Social History   Tobacco Use   Smoking status: Former    Packs/day: 0.25    Years: 15.00    Pack years: 3.75    Types: Cigarettes    Start date: 02/26/1966    Quit date: 07/01/1983    Years since quitting: 37.5   Smokeless tobacco: Never   Tobacco comments:    quit in 1984  Vaping Use   Vaping Use: Never used  Substance Use Topics   Alcohol use: No    Alcohol/week: 0.0 standard drinks   Drug use: No    Home Medications Prior to Admission medications   Medication Sig Start Date End Date  Taking? Authorizing Provider  acetaminophen (TYLENOL) 500 MG tablet Take 500 mg by mouth as needed for moderate pain or headache.     [provider]  amiodarone (PACERONE)  200 MG tablet TAKE 1 TABLET BY MOUTH EVERY MORNING 03/03/20   Strader, [place] M, PA-C  apixaban (ELIQUIS) 5 MG TABS tablet Take 1 tablet (5 mg total) by mouth 2 (two) times daily. 02/14/20   Arnoldo Lenis, MD  atorvastatin (LIPITOR) 80 MG tablet Place 1 tablet (80 mg total) into feeding tube daily. 09/22/20   Samella Parr, NP  Cholecalciferol (VITAMIN D3) 2000 units capsule Take 2,000 Units by mouth daily. 10/25/17   [provider]  DULoxetine (CYMBALTA) 30 MG capsule Take 30 mg by mouth 2 (two) times daily. 11/24/17   [provider]  feeding supplement (ENSURE ENLIVE / ENSURE PLUS) LIQD Take 237 mLs by mouth 3 (three) times daily between meals. 09/21/20   Samella Parr, NP  furosemide (LASIX) 20 MG tablet Take 1 tablet (20 mg total) by mouth daily. 09/22/20   Samella Parr, NP  gabapentin (NEURONTIN) 100 MG capsule Take 1 capsule (100 mg total) by mouth 3 (three) times daily. 09/21/20   Samella Parr, NP  insulin glargine (LANTUS) 100 UNIT/ML injection Inject 0.2 mLs (20 Units total) into the skin daily. 12/30/20   Orson Eva, MD  Lactase 9000 units CHEW Chew 1 tablet (9,000 Units total) by mouth every 4 (four) hours as needed (when eating dairy products). 12/30/20   Orson Eva, MD  levETIRAcetam (KEPPRA) 500 MG tablet Take 2 tablets (1,000 mg total) by mouth daily. 12/30/20   Orson Eva, MD  levothyroxine (SYNTHROID) 175 MCG tablet Take 1 tablet by mouth daily. 05/20/19   [provider]  loperamide (IMODIUM) 2 MG capsule 1 PO Q4H PRN LOOSE STOOLS 09/05/19   Fields, Sandi L, MD  LORazepam (ATIVAN) 0.5 MG tablet Take 0.5 mg by mouth every 8 (eight) hours as needed for anxiety or sedation.    [provider]  meclizine (ANTIVERT) 25 MG tablet Take 1 tablet (25 mg total) by  mouth every 8 (eight) hours as needed for dizziness. 10/16/20   Arnoldo Lenis, MD  Multiple Vitamin (MULTIVITAMIN WITH MINERALS) TABS tablet Take 1 tablet by mouth daily.    [provider]  ondansetron (ZOFRAN) 4 MG tablet TAKE 1 TABLET BY MOUTH FOUR TIMES DAILY AS NEEDED FOR NAUSEA 01/10/18   Annitta Needs, NP  OXYGEN 2 L daily.     [provider]  pantoprazole (PROTONIX) 40 MG tablet TAKE ONE TABLET BY MOUTH TWICE DAILY BEFORE APPLY MEAL 02/07/17   Annitta Needs, NP  potassium chloride (KLOR-CON) 10 MEQ tablet Take 20 mEq by mouth daily.    [provider]  SM MELATONIN 3 MG TABS tablet Take 3 mg by mouth at bedtime as needed for sleep. 08/22/20   [provider]    Allergies    Citalopram hydrobromide, Codeine, Hydromorphone hcl, Metoclopramide, and Hyoscyamine  Review of Systems   Review of Systems  Unable to perform ROS: Dementia  Constitutional:  Negative for fever.  Gastrointestinal:  Negative for abdominal pain, nausea and vomiting.  Psychiatric/Behavioral:  Positive for confusion.    Physical Exam Updated Vital Signs BP 115/86   Pulse 99   Temp 98.3 F (36.8 C) (Oral)   Resp (!) 25   Ht $R'5\' 8"'Fk$  (1.727 m)   Wt 106.1 kg   SpO2 92%   BMI 35.58 kg/m   Physical Exam Vitals and nursing note reviewed.  Constitutional:      General: She is awake. She is not in acute distress.    Appearance:  She is well-developed. She is obese.  HENT:     Head: Normocephalic and atraumatic.  Eyes:     Conjunctiva/sclera: Conjunctivae normal.  Cardiovascular:     Rate and Rhythm: Normal rate and regular rhythm.     Heart sounds: No murmur heard. Pulmonary:     Effort: Pulmonary effort is normal. No respiratory distress.     Breath sounds: Normal breath sounds.  Abdominal:     Palpations: Abdomen is soft.     Tenderness: There is no abdominal tenderness. There is no guarding or rebound.  Musculoskeletal:     Cervical back: Neck supple.     Right  lower leg: Edema present.     Left lower leg: Edema present.  Skin:    General: Skin is warm and dry.  Neurological:     General: No focal deficit present.     Mental Status: She is lethargic and disoriented.     Comments: Patient is arousable to voice.  She will follow commands.  She is moving all her extremities without any focal deficits.  She is not oriented to place or time.    ED Results / Procedures / Treatments   Labs (all labs ordered are listed, but only abnormal results are displayed) Labs Reviewed  COMPREHENSIVE METABOLIC PANEL - Abnormal; Notable for the following components:      Result Value   Glucose, Bld 287 (*)    Calcium 8.5 (*)    Total Protein 6.4 (*)    Albumin 3.2 (*)    All other components within normal limits  CBC - Abnormal; Notable for the following components:   RBC 3.19 (*)    Hemoglobin 9.1 (*)    HCT 31.6 (*)    MCHC 28.8 (*)    RDW 17.9 (*)    nRBC 0.4 (*)    All other components within normal limits  BRAIN NATRIURETIC PEPTIDE - Abnormal; Notable for the following components:   B Natriuretic Peptide 614.0 (*)    All other components within normal limits  BLOOD GAS, ARTERIAL - Abnormal; Notable for the following components:   pH, Arterial 7.259 (*)    pCO2 arterial 72.0 (*)    pO2, Arterial 71.3 (*)    Acid-Base Excess 4.5 (*)    All other components within normal limits  BLOOD GAS, ARTERIAL - Abnormal; Notable for the following components:   pCO2 arterial 49.9 (*)    pO2, Arterial 38.8 (*)    Bicarbonate 29.0 (*)    Acid-Base Excess 6.1 (*)    All other components within normal limits  GLUCOSE, CAPILLARY - Abnormal; Notable for the following components:   Glucose-Capillary 258 (*)    All other components within normal limits  URINALYSIS, ROUTINE W REFLEX MICROSCOPIC - Abnormal; Notable for the following components:   APPearance HAZY (*)    Glucose, UA 50 (*)    Hgb urine dipstick SMALL (*)    Ketones, ur 80 (*)    Leukocytes,Ua  SMALL (*)    All other components within normal limits  PROTIME-INR - Abnormal; Notable for the following components:   Prothrombin Time 23.5 (*)    INR 2.1 (*)    All other components within normal limits  HEPARIN LEVEL (UNFRACTIONATED) - Abnormal; Notable for the following components:   Heparin Unfractionated >1.10 (*)    All other components within normal limits  APTT - Abnormal; Notable for the following components:   aPTT 71 (*)    All other components within  normal limits  CBC - Abnormal; Notable for the following components:   RBC 2.64 (*)    Hemoglobin 7.5 (*)    HCT 24.5 (*)    RDW 17.7 (*)    nRBC 0.6 (*)    All other components within normal limits  GLUCOSE, CAPILLARY - Abnormal; Notable for the following components:   Glucose-Capillary 250 (*)    All other components within normal limits  GLUCOSE, CAPILLARY - Abnormal; Notable for the following components:   Glucose-Capillary 258 (*)    All other components within normal limits  GLUCOSE, CAPILLARY - Abnormal; Notable for the following components:   Glucose-Capillary 188 (*)    All other components within normal limits  POCT I-STAT 7, (LYTES, BLD GAS, ICA,H+H) - Abnormal; Notable for the following components:   pH, Arterial 7.554 (*)    pO2, Arterial 270 (*)    Bicarbonate 33.6 (*)    TCO2 35 (*)    Acid-Base Excess 10.0 (*)    Potassium 5.4 (*)    HCT 24.0 (*)    Hemoglobin 8.2 (*)    All other components within normal limits  TROPONIN I (HIGH SENSITIVITY) - Abnormal; Notable for the following components:   Troponin I (High Sensitivity) 108 (*)    All other components within normal limits  TROPONIN I (HIGH SENSITIVITY) - Abnormal; Notable for the following components:   Troponin I (High Sensitivity) 102 (*)    All other components within normal limits  RESP PANEL BY RT-PCR (FLU A&B, COVID) ARPGX2  MRSA NEXT GEN BY PCR, NASAL  CULTURE, BLOOD (ROUTINE X 2)  CULTURE, BLOOD (ROUTINE X 2)  CULTURE, RESPIRATORY W  GRAM STAIN  PROCALCITONIN  TSH  BLOOD GAS, ARTERIAL  STREP PNEUMONIAE URINARY ANTIGEN  LEGIONELLA PNEUMOPHILA SEROGP 1 UR AG  APTT    EKG EKG Interpretation  Date/Time:  Sunday January 03 2021 15:37:57 EDT Ventricular Rate:  114 PR Interval:    QRS Duration: 106 QT Interval:  410 QTC Calculation: 565 R Axis:   -44 Text Interpretation: Atrial fibrillation Incomplete RBBB and LAFB Low voltage, precordial leads Abnormal R-wave progression, early transition Nonspecific T abnormalities, lateral leads Prolonged QT interval No significant change since prior 6/22 Confirmed by Aletta Edouard [id number]) on 01/03/2021 3:44:40 PM  Radiology CT HEAD WO CONTRAST  Result Date: 01/03/2021 CLINICAL DATA:  Altered mental status EXAM: CT HEAD WITHOUT CONTRAST TECHNIQUE: Contiguous axial images were obtained from the base of the skull through the vertex without intravenous contrast. COMPARISON:  MRI brain dated 11/23/2020 FINDINGS: Brain: No evidence of acute infarction, hemorrhage, hydrocephalus, extra-axial collection or mass lesion/mass effect. Mild cortical and central atrophy. Subcortical white matter and periventricular small vessel ischemic changes. Vascular: No hyperdense vessel or unexpected calcification. Skull: Normal. Negative for fracture or focal lesion. Sinuses/Orbits: The visualized paranasal sinuses are essentially clear. The mastoid air cells are unopacified. Other: None. IMPRESSION: No evidence of acute intracranial abnormality. Mild atrophy with small vessel ischemic changes. Electronically Signed   By: Julian Hy M.D.   On: 01/03/2021 23:24   CT CHEST WO CONTRAST  Result Date: 01/03/2021 CLINICAL DATA:  Respiratory failure/hypoxia, intubated EXAM: CT CHEST WITHOUT CONTRAST TECHNIQUE: Multidetector CT imaging of the chest was performed following the standard protocol without IV contrast. COMPARISON:  Chest radiograph dated 01/03/2021. CT chest dated 11/22/2020 FINDINGS: Cardiovascular:  Cardiomegaly. No pericardial effusion. Mitral valve annular calcifications. No evidence of thoracic aortic aneurysm. Atherosclerotic calcifications of the aortic arch. Hypodense blood pool relative to myocardium, suggesting anemia.  Mediastinum/Nodes: No suspicious mediastinal lymphadenopathy. Visualized thyroid is unremarkable. Lungs/Pleura: Endotracheal tube terminates 3 cm above the carina. Progressive linear/patchy opacities in the bilateral upper lobes (for example, series 8/image 38). Additional linear/patchy opacities in the mid/lower lungs are chronic, suggesting scarring (for example, series 8/image 72 in the right middle lobe). However, there is progressive patchy opacities in the lingula and bilateral lower lobes, possibly atelectasis, although pneumonia is not excluded. No frank interstitial edema. Small bilateral pleural effusions, mildly progressive on the left and new on the right. No suspicious pulmonary nodules, noting motion degradation. No pneumothorax. Upper Abdomen: Enteric tube coursing into the gastric antrum. Cholecystectomy clips. Vascular calcifications. Musculoskeletal: Degenerative changes of the visualized thoracolumbar spine. Vertebral augmentation at T12. IMPRESSION: Multifocal patchy opacities, some of which may reflect atelectasis/scarring, although lingular and lower lobe pneumonia (left greater than right) is not excluded. Small bilateral pleural effusions, mildly progressive on the left and new on the right. No frank interstitial edema. Endotracheal tube terminates 3 cm above the carina. Enteric tube courses into the distal gastric antrum. Aortic Atherosclerosis (ICD10-I70.0). Electronically Signed   By: Julian Hy M.D.   On: 01/03/2021 23:35   DG Chest Port 1 View  Result Date: 01/03/2021 CLINICAL DATA:  Post intubation. EXAM: PORTABLE CHEST 1 VIEW COMPARISON:  01/03/2021 FINDINGS: An endotracheal tube has been placed with tip measuring 3.4 cm above the carina. An  enteric tube has been placed. Tip is off the field of view but below the left hemidiaphragm. Cardiac enlargement. Linear atelectasis or consolidation in the mid and lower lungs. Probable left pleural effusion. No pneumothorax. Calcified aorta. Prominence of the left pulmonary outflow tract. IMPRESSION: Appliances appear in satisfactory position. Persistent finding of cardiac enlargement. Atelectasis in the lung bases. Left pleural effusion. Electronically Signed   By: Lucienne Capers M.D.   On: 01/03/2021 19:04   DG Chest Port 1 View  Result Date: 01/03/2021 CLINICAL DATA:  Shortness of breath.  Hypoxia. EXAM: PORTABLE CHEST 1 VIEW COMPARISON:  Most recent radiograph 12/25/2008. Most recent CT 11/22/2020 FINDINGS: Marked chronic cardiomegaly. Retrocardiac opacity likely combination of pleural effusion and atelectasis, similar to prior exam. Mild scarring in the right mid lung and right lung base. No new airspace disease. No pneumothorax. Bones under mineralized. Previous right upper extremity PICC has been removed. IMPRESSION: Chronic cardiomegaly. Retrocardiac opacity likely combination of pleural effusion and atelectasis, similar to prior exam 10 days ago. Scattered scarring/atelectasis in the right lung. Electronically Signed   By: Keith Rake M.D.   On: 01/03/2021 16:20   ECHOCARDIOGRAM COMPLETE  Result Date: 01/04/2021    ECHOCARDIOGRAM REPORT   Patient Name:   BOBBE [name] Date of Exam: 01/04/2021 Medical Rec #:  [id number]     Height:       68.0 in Accession #:    [id number]    Weight:       237.2 lb Date of Birth:  1950/11/20     BSA:          2.197 m Patient Age:    55 years      BP:           133/46 mmHg Patient Gender: F             HR:           75 bpm. Exam Location:  Inpatient Procedure: 2D Echo, 3D Echo, Cardiac Doppler, Color Doppler and Strain Analysis STAT ECHO Indications:    CHF-Acute Diastolic X83.29  History:  Patient has prior history of Echocardiogram examinations, most                  recent 11/22/2020. CAD and Previous Myocardial Infarction,                 Pulmonary HTN and Stroke, Arrythmias:Atrial Fibrillation,                 Signs/Symptoms:Dementia; Risk Factors:Diabetes, Hypertension,                 Dyslipidemia and Sleep Apnea. Acute metabolic encephalopathy,                 Chronic kidney disease, anemia. GERD.  Sonographer:    Darlina Sicilian RDCS Referring Phys: [id number] [place]  1. Left ventricular ejection fraction, by estimation, is 45 to 50%. Left ventricular ejection fraction by PLAX is 46 %. The left ventricle has mildly decreased function. The left ventricle demonstrates global hypokinesis. There is moderate concentric left ventricular hypertrophy. Left ventricular diastolic parameters are indeterminate.  2. Right ventricular systolic function is moderately reduced. The right ventricular size is normal. There is severely elevated pulmonary artery systolic pressure. The estimated right ventricular systolic pressure is 25.8 mmHg.  3. Tricuspid valve regurgitation is at least moderate.  4. The inferior vena cava is normal in size with <50% respiratory variability, suggesting right atrial pressure of 8 mmHg.  5. The mitral valve is abnormal. Mild to moderate mitral valve regurgitation, with blunting of systolic pulmonary vein flow. No evidence of mitral stenosis. Severe mitral annular calcification.  6. Left atrial size was severely dilated.  7. The aortic valve is tricuspid. There is mild calcification of the aortic valve. Aortic valve regurgitation is not visualized. No aortic stenosis is present. Comparison(s): A prior study was performed on 11/22/20. Prior images reviewed side by side. LV function appears slightly less vigorous with similar tricuspid regurgitation. FINDINGS  Left Ventricle: Left ventricular ejection fraction, by estimation, is 45 to 50%. Left ventricular ejection fraction by PLAX is 46 %. The left ventricle has mildly decreased  function. The left ventricle demonstrates global hypokinesis. 3D left ventricular ejection fraction analysis performed but not reported based on interpreter judgement due to suboptimal quality. The left ventricular internal cavity size was normal in size. There is moderate concentric left ventricular hypertrophy. Left ventricular diastolic parameters are indeterminate. Right Ventricle: The right ventricular size is normal. No increase in right ventricular wall thickness. Right ventricular systolic function is moderately reduced. There is severely elevated pulmonary artery systolic pressure. The tricuspid regurgitant velocity is 3.61 m/s, and with an assumed right atrial pressure of 8 mmHg, the estimated right ventricular systolic pressure is 52.7 mmHg. Left Atrium: Left atrial size was severely dilated. Right Atrium: Right atrial size was not well visualized. Pericardium: There is no evidence of pericardial effusion. Mitral Valve: The mitral valve is abnormal. Severe mitral annular calcification. Mild to moderate mitral valve regurgitation. No evidence of mitral valve stenosis. The mean mitral valve gradient is 2.0 mmHg with average heart rate of 123 bpm. Tricuspid Valve: The tricuspid valve is abnormal. Tricuspid valve regurgitation is moderate . No evidence of tricuspid stenosis. Aortic Valve: The aortic valve is tricuspid. There is mild calcification of the aortic valve. Aortic valve regurgitation is not visualized. No aortic stenosis is present. Pulmonic Valve: The pulmonic valve was normal in structure. Pulmonic valve regurgitation is mild to moderate. No evidence of pulmonic stenosis. Aorta: The aortic root and ascending aorta are structurally normal,  with no evidence of dilitation. Venous: The inferior vena cava is normal in size with less than 50% respiratory variability, suggesting right atrial pressure of 8 mmHg. IAS/Shunts: The atrial septum is grossly normal.  LEFT VENTRICLE PLAX 2D LV EF:         Left             Diastology                ventricular     LV e' medial:    5.42 cm/s                ejection        LV E/e' medial:  21.8                fraction by     LV e' lateral:   8.72 cm/s                PLAX is 46      LV E/e' lateral: 13.5                %. LVIDd:         4.80 cm LVIDs:         3.70 cm LV PW:         1.20 cm LV IVS:        1.50 cm LVOT diam:     2.00 cm LV SV:         63 LV SV Index:   29 LVOT Area:     3.14 cm  RIGHT VENTRICLE RV S prime:     6.50 cm/s TAPSE (M-mode): 1.3 cm LEFT ATRIUM            Index       RIGHT ATRIUM           Index LA diam:      5.00 cm  2.28 cm/m  RA Area:     24.80 cm LA Vol (A2C): 81.2 ml  36.95 ml/m RA Volume:   62.80 ml  28.58 ml/m LA Vol (A4C): 124.0 ml 56.43 ml/m  AORTIC VALVE LVOT Vmax:   101.45 cm/s LVOT Vmean:  63.600 cm/s LVOT VTI:    0.201 m  AORTA Ao Root diam: 2.70 cm Ao Asc diam:  3.10 cm MITRAL VALVE                TRICUSPID VALVE MV Area (PHT): 4.40 cm     TR Peak grad:   52.1 mmHg MV Mean grad:  2.0 mmHg     TR Vmax:        361.00 cm/s MV Decel Time: 173 msec MR Peak grad: 70.6 mmHg     SHUNTS MR Mean grad: 46.0 mmHg     Systemic VTI:  0.20 m MR Vmax:      420.00 cm/s   Systemic Diam: 2.00 cm MR Vmean:     315.0 cm/s MV E velocity: 118.00 cm/s Rudean Haskell MD Electronically signed by Rudean Haskell MD Signature Date/Time: 01/04/2021/8:38:33 AM    Final     Procedures .Critical Care  Date/Time: 01/03/2021 6:24 PM Performed by: Hayden Rasmussen, MD Authorized by: Hayden Rasmussen, MD   Critical care provider statement:    Critical care time (minutes):  45   Critical care time was exclusive of:  Separately billable procedures and treating other patients   Critical care was necessary to treat or prevent imminent or life-threatening deterioration of the following conditions:  Respiratory failure   Critical care was time spent personally by me on the following activities:  Discussions with consultants, evaluation of patient's  response to treatment, examination of patient, ordering and performing treatments and interventions, ordering and review of laboratory studies, ordering and review of radiographic studies, pulse oximetry, re-evaluation of patient's condition, obtaining history from patient or surrogate, review of old charts and development of treatment plan with patient or surrogate Procedure Name: Intubation Date/Time: 01/03/2021 6:24 PM Performed by: Hayden Rasmussen, MD Pre-anesthesia Checklist: Patient identified, Patient being monitored, Emergency Drugs available, Timeout performed and Suction available Oxygen Delivery Method: Non-rebreather mask Preoxygenation: Pre-oxygenation with 100% oxygen Induction Type: Rapid sequence Ventilation: Mask ventilation without difficulty Laryngoscope Size: Glidescope Grade View: Grade III Tube size: 7.5 mm Number of attempts: 1 Placement Confirmation: ETT inserted through vocal cords under direct vision, CO2 detector and Breath sounds checked- equal and bilateral Secured at: 23 cm Tube secured with: ETT holder Dental Injury: Teeth and Oropharynx as per pre-operative assessment  Difficulty Due To: Difficulty was anticipated      Medications Ordered in ED Medications  propofol (DIPRIVAN) 1000 MG/100ML infusion (15 mcg/kg/min  106.1 kg Intravenous Infusion Verify 01/04/21 0800)  fentaNYL 2535mg in NS 2567m(1055mml) infusion-PREMIX (100 mcg/hr Intravenous Infusion Verify 01/04/21 0800)  fentaNYL (SUBLIMAZE) bolus via infusion 25-100 mcg (has no administration in time range)  sodium chloride 0.9 % bolus 1,000 mL (1,000 mLs Intravenous Not Given 01/03/21 2347)  Chlorhexidine Gluconate Cloth 2 % PADS 6 each (6 each Topical Given 01/04/21 0641)  ipratropium-albuterol (DUONEB) 0.5-2.5 (3) MG/3ML nebulizer solution 3 mL (has no administration in time range)  insulin aspart (novoLOG) injection 0-15 Units (3 Units Subcutaneous Given 01/04/21 0743)  docusate (COLACE) 50 MG/5ML  liquid 100 mg (has no administration in time range)  polyethylene glycol (MIRALAX / GLYCOLAX) packet 17 g (has no administration in time range)  pantoprazole (PROTONIX) injection 40 mg (has no administration in time range)  azithromycin (ZITHROMAX) 250 mg in dextrose 5 % 125 mL IVPB (has no administration in time range)  heparin ADULT infusion 100 units/mL (25000 units/250m23m2,100 Units/hr Intravenous Infusion Verify 01/04/21 0800)  gabapentin (NEURONTIN) 250 MG/5ML solution 100 mg (100 mg Per Tube Given 01/04/21 0641)  levothyroxine (SYNTHROID) tablet 175 mcg (175 mcg Per Tube Given 01/04/21 0645)  amiodarone (PACERONE) tablet 200 mg (has no administration in time range)  atorvastatin (LIPITOR) tablet 80 mg (has no administration in time range)  ceFEPIme (MAXIPIME) 2 g in sodium chloride 0.9 % 100 mL IVPB (0 g Intravenous Stopped 01/04/21 0711)  vancomycin (VANCOREADY) IVPB 1250 mg/250 mL (has no administration in time range)  0.9 %  sodium chloride infusion (250 mLs Intravenous New Bag/Given 01/04/21 0838)  chlorhexidine gluconate (MEDLINE KIT) (PERIDEX) 0.12 % solution 15 mL (15 mLs Mouth Rinse Given 01/04/21 0733)  MEDLINE mouth rinse (15 mLs Mouth Rinse Given 01/04/21 0642)  levETIRAcetam (KEPPRA) 100 MG/ML solution 1,000 mg (has no administration in time range)  furosemide (LASIX) injection 40 mg (has no administration in time range)  fentaNYL (SUBLIMAZE) injection 50 mcg (50 mcg Intravenous Given 01/03/21 1611)  fentaNYL (SUBLIMAZE) injection 50 mcg (50 mcg Intravenous Given 01/03/21 1727)  succinylcholine (ANECTINE) injection 150 mg (150 mg Intravenous Given 01/03/21 1816)  etomidate (AMIDATE) injection 20 mg (20 mg Intravenous Given 01/03/21 1815)  methylPREDNISolone sodium succinate (SOLU-MEDROL) 125 mg/2 mL injection 125 mg (125 mg Intravenous Given 01/03/21 1845)  fentaNYL (SUBLIMAZE) injection 100 mcg (100 mcg Intravenous Given 01/03/21 1850)  azithromycin (  ZITHROMAX) 500 mg in sodium chloride 0.9 % 250  mL IVPB (0 mg Intravenous Stopped 01/04/21 0059)  vancomycin (VANCOREADY) IVPB 1500 mg/300 mL (0 mg Intravenous Stopped 01/04/21 0411)    ED Course  I have reviewed the triage vital signs and the nursing notes.  Pertinent labs & imaging results that were available during my care of the patient were reviewed by me and considered in my medical decision making (see chart for details).  Clinical Course as of 01/04/21 5749  Sun Jan 03, 2021  1613 Chest x-ray showing some cardiomegaly and likely some fluid overload.  Awaiting radiology reading [MB]  1716 Discussed with [facility] hospitalist Dr. Sheran Lawless who will evaluate the patient for admission. [MB]  1750 Patient continues to pull out mask and has pulled off the mask.  Daughter would like her intubated.  Updated hospitalist Dr. Nehemiah Settle [MB]  1754 Discussed with critical care Dr. Kimber Relic.  He recommends steroids and Lasix if has blood pressure to tolerate.  He asked me to put her in for a bed to [facility] ICU. [MB]    Clinical Course User Index [MB] Hayden Rasmussen, MD   MDM Rules/Calculators/A&P                         This patient complains of confusion; this involves an extensive number of treatment Options and is a complaint that carries with it a high risk of complications and Morbidity. The differential includes hypercarbia, hypoxia, COVID, CHF COPD, pneumonia, metabolic derangement, ACS  I ordered, reviewed and interpreted labs, which included CBC with normal white count, hemoglobin low but stable from priors, chemistries fairly normal other than elevated glucose, ABG with acidosis and CO2 retention, troponins elevated will need to be trended, BNP elevated, COVID and flu negative I ordered medication IV steroids fentanyl for agitation succinylcholine and etomidate for intubation, propofol drip for agitation I ordered imaging studies which included chest x-ray and I independently    visualized and interpreted imaging which showed good tube  placement Additional history obtained from patient's daughter Previous records obtained and reviewed in epic including last admission I consulted Dr. Nehemiah Settle [facility] hospitalist Dr. Vaughan Browner critical care and discussed lab and imaging findings  Critical Interventions: Intubation and management of patient's hypercapnic hypoxic respiratory failure  After the interventions stated above, I reevaluated the patient and found patient to be critically ill and needed to be admitted to [place].   Final Clinical Impression(s) / ED Diagnoses Final diagnoses:  Confusion  Hypercapnia  Acute respiratory failure with hypercapnia [facility])    Rx / DC Orders ED Discharge Orders     None        Hayden Rasmussen, MD 01/04/21 [phone]

## 2021-01-03 NOTE — ED Notes (Signed)
Date and time results received: 01/03/21 1954 (use smartphrase ".now" to insert current time)  Test: pO2 Critical Value: 38.8  Name of Provider Notified: Charm Barges, MD  Orders Received? Or Actions Taken?:

## 2021-01-03 NOTE — Progress Notes (Signed)
ANTICOAGULATION CONSULT NOTE - Initial Consult  Pharmacy Consult for heparin Indication: atrial fibrillation  Allergies  Allergen Reactions   Citalopram Hydrobromide Other (See Comments)    Dyskinesia Other reaction(s): Other (See Comments) Dyskinesia   Codeine Nausea And Vomiting and Other (See Comments)    HALLUCINATIONS Other reaction(s): Unknown Other reaction(s): Other (See Comments) HALLUCINATIONS    Hydromorphone Hcl Other (See Comments)    Made her pass out Other reaction(s): Other (See Comments) Made her pass out   Metoclopramide Other (See Comments)    DYSKINESIA Other reaction(s): Other (See Comments) DYSKINESIA   Hyoscyamine Other (See Comments)    MADE DIARRHEA WORSE    Patient Measurements: Height: 5\' 8"  (172.7 cm) Weight: 107.6 kg (237 lb 3.4 oz) IBW/kg (Calculated) : 63.9 Heparin Dosing Weight: 87.8 kg   Vital Signs: Temp: 98.5 F (36.9 C) (07/03 2015) Temp Source: Oral (07/03 2015) BP: 85/54 (07/03 2015) Pulse Rate: 88 (07/03 2015)  Labs: Recent Labs    01/03/21 1255 01/03/21 1527 01/03/21 1728  HGB 9.1*  --   --   HCT 31.6*  --   --   PLT 243  --   --   CREATININE 1.00  --   --   TROPONINIHS  --  108* 102*    Estimated Creatinine Clearance: 66.3 mL/min (by C-G formula based on SCr of 1 mg/dL).   Medical History: Past Medical History:  Diagnosis Date   Abnormal pulmonary function test    Anemia    H/H of 10/30 with a normal MCV in 12/09   Anxiety    Arthritis    Barrett's esophagus    Diagnosed 1995. Last EGD 2016-NO BARRETT'S.    Chest pain    Negative cardiac catheterization in 2002; negative stress nuclear study in 2008   Chronic anticoagulation    Chronic combined systolic and diastolic CHF (congestive heart failure) (HCC)    a. EF predominantly normal during prior echoes but was 40% during 10/2014 echo. b. Most recent 01/2015 EF normal, 55-60%.   Chronic LBP    Surgical intervention in 1996   Diabetes mellitus, type 2  (HCC)    Insulin therapy; exacerbated by prednisone   Dysrhythmia    AFib   Gastroparesis    99% retention 05/2008 on GES   GERD (gastroesophageal reflux disease)    Heart attack (HCC) 08/13/2020   Hiatal hernia    Hyperlipidemia    Hypertension    Hypothyroid    IBS (irritable bowel syndrome)    Obesity    OSA on CPAP    had CPAP and cannot tolerate.   Paroxysmal atrial fibrillation (HCC)    Pulmonary hypertension (HCC) 01/2015   a. Predominantly pulmonary venous hypertension but may be component of PAH.   Seizures (HCC)    last seizure was 2 years ago; on keppra for this; unknown etiology   Syncope    a. Admitted 05/2009; magnetic resonance imagin/ MRA - negative; etiology thought to be orthostasis secondary to drugs and dehydration. b. Syncope 02/2015 also felt 2/2 dehydration.    Medications:  Scheduled:   [START ON 01/04/2021] Chlorhexidine Gluconate Cloth  6 each Topical Q0600   [START ON 01/04/2021] insulin aspart  0-15 Units Subcutaneous Q4H   pantoprazole (PROTONIX) IV  40 mg Intravenous QHS    Assessment: 69 yof presenting with AMS, increased WOB, and SOB - now intubated. On apixaban PTA for hx afib (LD 7/3@0800 ).  Hgb 9.1, plt 243. Trop 108>102. No s/sx of bleeding. Given  recent DOAC dosing, will monitor both aPTT and heparin level until correlate.   Goal of Therapy:  Heparin level 0.3-0.7 units/ml aPTT 66-102 seconds Monitor platelets by anticoagulation protocol: Yes   Plan:  No bolus given recent DOAC Will order heparin infusion at 1200 now given >12 hr since last reported apixaban dose Order heparin level and aPTT in 8 hours Monitor daily HL/aPTT until correlate, CBC, and for s/sx of bleeding F/u restart apixaban   Sherron Monday, PharmD, BCCCP Clinical Pharmacist  Phone: [phone] 01/03/2021 10:53 PM  Please check AMION for all [facility] Pharmacy phone numbers After 10:00 PM, call Main Pharmacy [phone]

## 2021-01-03 NOTE — ED Notes (Signed)
Date and time results received: 01/03/21 1823  Test: Trop Critical Value: 102  Name of Provider Notified: Charm Barges

## 2021-01-04 ENCOUNTER — Inpatient Hospital Stay (HOSPITAL_COMMUNITY): Payer: PPO

## 2021-01-04 DIAGNOSIS — J9622 Acute and chronic respiratory failure with hypercapnia: Secondary | ICD-10-CM | POA: Diagnosis not present

## 2021-01-04 DIAGNOSIS — Z515 Encounter for palliative care: Secondary | ICD-10-CM

## 2021-01-04 DIAGNOSIS — Z7189 Other specified counseling: Secondary | ICD-10-CM | POA: Diagnosis not present

## 2021-01-04 DIAGNOSIS — I5031 Acute diastolic (congestive) heart failure: Secondary | ICD-10-CM | POA: Diagnosis not present

## 2021-01-04 DIAGNOSIS — J9621 Acute and chronic respiratory failure with hypoxia: Secondary | ICD-10-CM | POA: Diagnosis not present

## 2021-01-04 LAB — ECHOCARDIOGRAM COMPLETE
Area-P 1/2: 4.4 cm2
Height: 68 in
MV M vel: 4.2 m/s
MV Peak grad: 70.6 mmHg
S' Lateral: 3.7 cm
Weight: 3795.44 oz

## 2021-01-04 LAB — APTT
aPTT: 71 seconds — ABNORMAL HIGH (ref 24–36)
aPTT: >200 seconds (ref 24–36)

## 2021-01-04 LAB — PROTIME-INR
INR: 2.1 — ABNORMAL HIGH (ref 0.8–1.2)
Prothrombin Time: 23.5 seconds — ABNORMAL HIGH (ref 11.4–15.2)

## 2021-01-04 LAB — CBC
HCT: 24.5 % — ABNORMAL LOW (ref 36.0–46.0)
Hemoglobin: 7.5 g/dL — ABNORMAL LOW (ref 12.0–15.0)
MCH: 28.4 pg (ref 26.0–34.0)
MCHC: 30.6 g/dL (ref 30.0–36.0)
MCV: 92.8 fL (ref 80.0–100.0)
Platelets: 195 10*3/uL (ref 150–400)
RBC: 2.64 MIL/uL — ABNORMAL LOW (ref 3.87–5.11)
RDW: 17.7 % — ABNORMAL HIGH (ref 11.5–15.5)
WBC: 7 10*3/uL (ref 4.0–10.5)
nRBC: 0.6 % — ABNORMAL HIGH (ref 0.0–0.2)

## 2021-01-04 LAB — PHOSPHORUS
Phosphorus: 2.9 mg/dL (ref 2.5–4.6)
Phosphorus: 2.1 mg/dL — ABNORMAL LOW (ref 2.5–4.6)

## 2021-01-04 LAB — GLUCOSE, CAPILLARY
Glucose-Capillary: 258 mg/dL — ABNORMAL HIGH (ref 70–99)
Glucose-Capillary: 188 mg/dL — ABNORMAL HIGH (ref 70–99)
Glucose-Capillary: 184 mg/dL — ABNORMAL HIGH (ref 70–99)
Glucose-Capillary: 179 mg/dL — ABNORMAL HIGH (ref 70–99)
Glucose-Capillary: 162 mg/dL — ABNORMAL HIGH (ref 70–99)
Glucose-Capillary: 123 mg/dL — ABNORMAL HIGH (ref 70–99)

## 2021-01-04 LAB — MAGNESIUM
Magnesium: 2.4 mg/dL (ref 1.7–2.4)
Magnesium: 2.2 mg/dL (ref 1.7–2.4)

## 2021-01-04 LAB — STREP PNEUMONIAE URINARY ANTIGEN: Strep Pneumo Urinary Antigen: NEGATIVE

## 2021-01-04 LAB — TSH: TSH: 0.697 u[IU]/mL (ref 0.350–4.500)

## 2021-01-04 LAB — HEPARIN LEVEL (UNFRACTIONATED): Heparin Unfractionated: >1.1 IU/mL — ABNORMAL HIGH (ref 0.30–0.70)

## 2021-01-04 LAB — PROCALCITONIN: Procalcitonin: <0.1 ng/mL

## 2021-01-04 MED ORDER — SODIUM CHLORIDE 0.9% FLUSH: 10.0000 mL | INTRAVENOUS | Status: DC | PRN

## 2021-01-04 MED ORDER — ORAL CARE MOUTH RINSE
15.0000 mL | OROMUCOSAL | Status: DC
  Administered 2021-01-04 – 2021-02-15 (×405): 15 mL via OROMUCOSAL

## 2021-01-04 MED ORDER — VANCOMYCIN HCL 1250 MG/250ML IV SOLN
1250.0000 mg | INTRAVENOUS | Status: DC
  Administered 2021-01-05: 1250 mg via INTRAVENOUS
  Filled 2021-01-04: qty 250

## 2021-01-04 MED ORDER — GELATIN ABSORBABLE 12-7 MM EX MISC
1.0000 | Freq: Once | CUTANEOUS | Status: DC
  Filled 2021-01-04: qty 1

## 2021-01-04 MED ORDER — CHLORHEXIDINE GLUCONATE 0.12% ORAL RINSE (MEDLINE KIT)
15.0000 mL | Freq: Two times a day (BID) | OROMUCOSAL | Status: DC
  Administered 2021-01-04 – 2021-02-15 (×83): 15 mL via OROMUCOSAL

## 2021-01-04 MED ORDER — LEVETIRACETAM 100 MG/ML PO SOLN
1000.0000 mg | Freq: Two times a day (BID) | ORAL | Status: DC
  Administered 2021-01-04 – 2021-02-15 (×83): 1000 mg
  Filled 2021-01-04 (×88): qty 10

## 2021-01-04 MED ORDER — LACTATED RINGERS IV BOLUS
500.0000 mL | Freq: Once | INTRAVENOUS | Status: AC
  Administered 2021-01-04: 500 mL via INTRAVENOUS

## 2021-01-04 MED ORDER — VITAL HIGH PROTEIN PO LIQD
1000.0000 mL | ORAL | Status: DC
  Administered 2021-01-04 – 2021-01-06 (×3): 1000 mL

## 2021-01-04 MED ORDER — NOREPINEPHRINE 4 MG/250ML-% IV SOLN
0.0000 ug/min | INTRAVENOUS | Status: DC
  Administered 2021-01-04: 2 ug/min via INTRAVENOUS
  Administered 2021-01-05: 5 ug/min via INTRAVENOUS
  Administered 2021-01-06: 2 ug/min via INTRAVENOUS
  Administered 2021-01-07: 6 ug/min via INTRAVENOUS
  Administered 2021-01-08: 4 ug/min via INTRAVENOUS
  Administered 2021-01-08: 7 ug/min via INTRAVENOUS
  Filled 2021-01-04 (×6): qty 250

## 2021-01-04 MED ORDER — HEPARIN (PORCINE) 25000 UT/250ML-% IV SOLN
1200.0000 [IU]/h | INTRAVENOUS | Status: DC
  Administered 2021-01-04: 1200 [IU]/h via INTRAVENOUS
  Filled 2021-01-04: qty 250

## 2021-01-04 MED ORDER — SODIUM CHLORIDE 0.9 % IV SOLN
INTRAVENOUS | Status: DC | PRN
  Administered 2021-01-04 – 2021-01-27 (×4): 250 mL via INTRAVENOUS

## 2021-01-04 MED ORDER — SODIUM CHLORIDE 0.9% FLUSH
10.0000 mL | Freq: Two times a day (BID) | INTRAVENOUS | Status: DC
Start: 2021-01-04 — End: 2021-02-01
  Administered 2021-01-04 – 2021-01-06 (×5): 10 mL
  Administered 2021-01-06: 20 mL
  Administered 2021-01-07: 10 mL
  Administered 2021-01-07 – 2021-01-08 (×2): 20 mL
  Administered 2021-01-09 – 2021-01-12 (×6): 10 mL
  Administered 2021-01-12: 20 mL
  Administered 2021-01-13 (×2): 10 mL
  Administered 2021-01-14: 20 mL
  Administered 2021-01-14: 10 mL
  Administered 2021-01-15: 20 mL
  Administered 2021-01-15 – 2021-01-26 (×22): 10 mL
  Administered 2021-01-27: 20 mL
  Administered 2021-01-27 – 2021-01-29 (×4): 10 mL
  Administered 2021-01-29: 20 mL
  Administered 2021-01-30 – 2021-02-01 (×5): 10 mL

## 2021-01-04 MED ORDER — FUROSEMIDE 10 MG/ML IJ SOLN
40.0000 mg | Freq: Two times a day (BID) | INTRAMUSCULAR | Status: DC
  Administered 2021-01-04 – 2021-01-08 (×8): 40 mg via INTRAVENOUS
  Filled 2021-01-04 (×8): qty 4

## 2021-01-04 NOTE — Progress Notes (Signed)
ANTICOAGULATION CONSULT NOTE - Follow-Up Consult  Pharmacy Consult for heparin Indication: atrial fibrillation  Patient Measurements: Height: 5\' 8"  (172.7 cm) Weight: 107.6 kg (237 lb 3.4 oz) IBW/kg (Calculated) : 63.9 Heparin Dosing Weight: 87.8 kg   Vital Signs: Temp: 98.1 F (36.7 C) (07/04 0746) Temp Source: Axillary (07/04 0746) BP: 154/102 (07/04 0830) Pulse Rate: 82 (07/04 0746)  Labs: Recent Labs    01/03/21 1255 01/03/21 1527 01/03/21 1728 01/03/21 2326 01/04/21 0358  HGB 9.1*  --   --  8.2* 7.5*  HCT 31.6*  --   --  24.0* 24.5*  PLT 243  --   --   --  195  APTT  --   --   --   --  71*  LABPROT  --   --   --   --  23.5*  INR  --   --   --   --  2.1*  HEPARINUNFRC  --   --   --   --  >1.10*  CREATININE 1.00  --   --   --   --   TROPONINIHS  --  108* 102*  --   --      Estimated Creatinine Clearance: 66.3 mL/min (by C-G formula based on SCr of 1 mg/dL).  Assessment: 51 yof presenting with AMS, increased WOB, and SOB - now intubated. On apixaban PTA for hx afib (LD 7/3@0800 ) - now transitioned to Heparin.  aPTT with AM labs was therapeutic (aPTT 71) however drawn early only 4 hours after drip start. Recheck at steady state was SUPRAtherapeutic (aPTT >200) however RN had inadvertently increased the rate to 2100 units/hr which was a rate adjustment intended for an alternate patient (order for this patient never changed). No bleeding noted at this time - confirmed drawn from opposite arm.   Will hold the drip for 2 hours and restart at the previous intended rate and recheck a repeat aPTT in 8 hours.  Goal of Therapy:  Heparin level 0.3-0.7 units/ml aPTT 66-102 seconds Monitor platelets by anticoagulation protocol: Yes   Plan:  - Hold Heparin for 2 hours - Restart Heparin at 1200 units/hr starting at 1600 - Will continue to monitor for any signs/symptoms of bleeding and will follow up with heparin level in 8 hours   Thank you for allowing pharmacy to be a  part of this patient's care.  62, PharmD, BCPS Clinical Pharmacist Clinical phone for 01/04/2021: 03/07/2021 01/04/2021 9:24 AM   **Pharmacist phone directory can now be found on [email or web address] ([email or web address]).  Listed under [facility].

## 2021-01-04 NOTE — Consult Note (Signed)
Palliative Care Consult Note                                  Date: 01/04/2021   Patient Name: [name]  DOB: [birth year]  MRN: [id number]  Age / Sex: 70 y.o., female  PCP: Lemmie Evens, MD Referring Physician: Marshell Garfinkel, MD  Reason for Consultation: Establishing goals of care  HPI/Patient Profile: Palliative Care consult requested for goals of care discussion in this 70 y.o. female  with past medical history of obesity, atrial fibrillation (Eliquis), dementia, dCHF, seizure disorder, CKD 3, CAD with prior NSTEMI (08/2020), GERD, chronic hypoxemia 2L home oxygen, history of tracheostomy, and GERD. She was admitted on 01/03/2021 from home with increased shortness of breath and altered mental status for several days. Placed on non-rebreather by EMS due to hypoxia (65%). During work-up placed on BiPAP, however later required intubation as she was not cooperative in the setting of AMS. BNP 614.   Patient was recently admitted and treated (6/20-6/29) for similar presentation of acute on chronic respiratory failure.   Past Medical History:  Diagnosis Date   Abnormal pulmonary function test    Anemia    H/H of 10/30 with a normal MCV in 12/09   Anxiety    Arthritis    Barrett's esophagus    Diagnosed 1995. Last EGD 2016-NO BARRETT'S.    Chest pain    Negative cardiac catheterization in 2002; negative stress nuclear study in 2008   Chronic anticoagulation    Chronic combined systolic and diastolic CHF (congestive heart failure) ([place])    a. EF predominantly normal during prior echoes but was 40% during 10/2014 echo. b. Most recent 01/2015 EF normal, 55-60%.   Chronic LBP    Surgical intervention in 1996   Diabetes mellitus, type 2 (HCC)    Insulin therapy; exacerbated by prednisone   Dysrhythmia    AFib   Gastroparesis    99% retention 05/2008 on GES   GERD (gastroesophageal reflux disease)    Heart attack ([place]) 08/13/2020   Hiatal  hernia    Hyperlipidemia    Hypertension    Hypothyroid    IBS (irritable bowel syndrome)    Obesity    OSA on CPAP    had CPAP and cannot tolerate.   Paroxysmal atrial fibrillation ([place])    Pulmonary hypertension ([place]) 01/2015   a. Predominantly pulmonary venous hypertension but may be component of PAH.   Seizures ([place])    last seizure was 2 years ago; on keppra for this; unknown etiology   Syncope    a. Admitted 05/2009; magnetic resonance imagin/ MRA - negative; etiology thought to be orthostasis secondary to drugs and dehydration. b. Syncope 02/2015 also felt 2/2 dehydration.   Subjective:   This NP Osborne Oman reviewed medical records, received report from team, assessed the patient and then met at the patient's bedside with patient's daughter/HCPOA, [name] to discuss diagnosis, prognosis, GOC, EOL wishes disposition and options.   Patient is known to the Palliative team from previous admission. We were last involved in patient's care during previous admission in June. At that time outpatient Palliative support at discharge was recommended. Daughter, [name] verbalized remembrance of our team and previous discussions. I re-introduced the concept of Palliative Care and our role during [name]'s hospitalization. Values and goals of care important to patient and family were attempted to be elicited.  Patient remains on mechanical ventilator.  Created space and opportunity for daughter to explore thoughts and feelings.   We discussed Her current illness and what it means in the larger context of Her on-going co-morbidities. Natural disease trajectory and expectations were discussed. Questions and concerns addressed.   [name] is tearful during discussions. She shares previous experience with patient being intubated and requiring a tracheostomy. We discussed at length patient's chances of a meaningful recovery and decannulation given her current co-morbidities and decline in  overall health compared to 3 years prior. Daughter verbalizes understanding expressing she understands that this situation is most likely different acknowledging the frailty of her mother, however stating she, her step-father, and siblings all are remaining hopeful.    Daughter is clear in expressed goals to continue to treat aggressively. She shares she knows the medical team may disagree with her decisions however this is the only mother she has and wishes to keep her here as long as possible by any means. [name] would want to purse tracheostomy and PEG if required. She states she is prepared to provide 24/7 care for her mother in her home. She is ok with her going to [facility] such as [facility] as she did in the past, otherwise she would not be interested in other types of facilities. The ultimate goal would be to get her back home with her family "even if this means on a ventilator with a trach". [name] understands her mother may not recover as she did in the past, however she wishes to allow her every opportunity to do so as she is "not ready to give up on her!"   I discussed the importance of continued conversation with family and their medical providers regarding overall plan of care and treatment options, ensuring decisions are within the context of the patients values and GOCs.  Questions and concerns were addressed.  The family was encouraged to call with questions or concerns.  PMT will continue to support holistically as needed.  Life Review: Patient lives in the home with her husband of 35 years and her daughter, [name]. She has 4 children and 1 step-son. 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 Enjoys tv game shows and spending time with her family.   Patient Values: Christian faith.   Patient/Family Understanding of Illness:  Daughter verbalizes her understanding of patient's current illness and co-morbidities. She expresses her awareness of her  criticalness and the possibility that she will be unable to wean from the ventilator requiring further interventions such as a tracheostomy. She is remaining hopeful patient will show some improvement and this decision will not have to be made but is ready to do so if needed.    Objective:   Primary Diagnoses: Present on Admission:  Acute on chronic respiratory failure with hypoxia and hypercapnia (HCC)  Hypercapnic respiratory failure (HCC)  Acute respiratory failure with hypoxia and hypercapnia (HCC)   Scheduled Meds:  amiodarone  200 mg Per Tube Daily   atorvastatin  80 mg Per Tube Daily   chlorhexidine gluconate (MEDLINE KIT)  15 mL Mouth Rinse BID   Chlorhexidine Gluconate Cloth  6 each Topical Q0600   feeding supplement (VITAL HIGH PROTEIN)  1,000 mL Per Tube Q24H   furosemide  40 mg Intravenous Q12H   gabapentin  100 mg Per Tube Q8H   insulin aspart  0-15 Units Subcutaneous Q4H   levETIRAcetam  1,000 mg Per Tube BID   levothyroxine  175 mcg Per Tube M2500  mouth rinse  15 mL Mouth Rinse 10 times per day   pantoprazole (PROTONIX) IV  40 mg Intravenous Daily    Continuous Infusions:  sodium chloride 10 mL/hr at 01/04/21 1000   azithromycin     ceFEPime (MAXIPIME) IV Stopped (01/04/21 0711)   fentaNYL infusion INTRAVENOUS 100 mcg/hr (01/04/21 1000)   heparin 2,100 Units/hr (01/04/21 1000)   propofol (DIPRIVAN) infusion 15 mcg/kg/min (01/04/21 1117)   sodium chloride     vancomycin      PRN Meds: sodium chloride, docusate, fentaNYL, ipratropium-albuterol, polyethylene glycol  Allergies  Allergen Reactions   Citalopram Hydrobromide Other (See Comments)    Dyskinesia Other reaction(s): Other (See Comments) Dyskinesia   Codeine Nausea And Vomiting and Other (See Comments)    HALLUCINATIONS Other reaction(s): Unknown Other reaction(s): Other (See Comments) HALLUCINATIONS    Hydromorphone Hcl Other (See Comments)    Made her pass out Other reaction(s): Other (See  Comments) Made her pass out   Metoclopramide Other (See Comments)    DYSKINESIA Other reaction(s): Other (See Comments) DYSKINESIA   Hyoscyamine Other (See Comments)    MADE DIARRHEA WORSE    Review of Systems  Unable to perform ROS: Intubated  Unless otherwise noted, a complete review of systems is negative.  Physical Exam General: Intubated, critically-ill, NAD  Cardiovascular: regular rate and rhythm Pulmonary:  diminished bilaterally  Abdomen: soft, nontender, + bowel sounds Extremities: no edema, no joint deformities Skin: no rashes, warm and dry Neurological: intubated/sedated   Vital Signs:  BP (!) 157/96   Pulse 90   Temp 98.1 F (36.7 C) (Axillary)   Resp 20   Ht '5\' 8"'  (1.727 m)   Wt 107.6 kg   SpO2 100%   BMI 36.07 kg/m  Pain Scale: CPOT   Pain Score: Asleep  SpO2: SpO2: 100 % O2 Device:SpO2: 100 % O2 Flow Rate: .O2 Flow Rate (L/min): 4 L/min  IO: Intake/output summary:  Intake/Output Summary (Last 24 hours) at 01/04/2021 1138 Last data filed at 01/04/2021 1000 Gross per 24 hour  Intake 1406.92 ml  Output 625 ml  Net 781.92 ml    LBM: Last BM Date:  (PTA) Baseline Weight: Weight: 106.1 kg Most recent weight: Weight: 107.6 kg      Palliative Assessment/Data: Intubated    Advanced Care Planning:   Primary Decision Maker: HCPOA [name] (daughter)   Code Status/Advance Care Planning: Full code  A discussion was had today regarding advanced directives. Concepts specific to code status, artifical feeding and hydration, continued IV antibiotics and rehospitalization was had.    Daughter request patient continue to receive full scope, aggressive interventions and remain a full code.   Patient was previously recommended for outpatient Palliative support at discharge during her 12/2020 admission. Denise verbalizes they have not had a chance to establish care outpatient as patient required readmission. She remains open to their support at  discharge.   Assessment & Plan:   SUMMARY OF RECOMMENDATIONS   Full Code-as confirmed by daughter Continue with current plan of care, full scope/aggressive interventions Daughter is clear in expressed wishes she is not interested in considering comfort/end-of-life. She wishes to allow her mother every opportunity to live and if required would pursue Trach/Peg. She is remaining hopeful and preparing for what is to come with support of her siblings and father-in-law who are all in agreement. Despite discussions [name] understands her mother chances to recovery are poor however would still wish to afford her the opportunity as she is "not ready to  give up" on her mother.  Outpatient Palliative support recommended for ongoing support and discussions. This was previously arranged however, they have not seen patient due to readmission.  PMT will continue to support and follow as needed. Please call team line with urgent needs.  Symptom Management:  Per Attending  Palliative Prophylaxis:  Aspiration, Frequent Pain Assessment, and Oral Care  Additional Recommendations (Limitations, Scope, Preferences): Full Scope Treatment  Psycho-social/Spiritual:  Desire for further Chaplaincy support: no Additional Recommendations: Education on Hospice  Prognosis:  Guarded-Poor   Discharge Planning:  To Be Determined depending on hospital course.   Patient's daughter, [name] expressed understanding and was in agreement with this plan.   Time In: 1130 Time Out: 1220 Time Total: 50 min.   Visit consisted of counseling and education dealing with the complex and emotionally intense issues of symptom management and palliative care in the setting of serious and potentially life-threatening illness.Greater than 50%  of this time was spent counseling and coordinating care related to the above assessment and plan.  Signed by:  Alda Lea, AGPCNP-BC Palliative Medicine Team  Phone:  [phone] Pager: [phone] Amion: Bjorn Pippin   Thank you for allowing the Palliative Medicine Team to assist in the care of this patient. Please utilize secure chat with additional questions, if there is no response within 30 minutes please call the above phone number. Palliative Medicine Team providers are available by phone from 7am to 5pm daily and can be reached through the team cell phone.  Should this patient require assistance outside of these hours, please call the patient's attending physician.

## 2021-01-04 NOTE — Progress Notes (Signed)
Date and time results received: 01/04/21 0500   Test: hemoglobin Critical Value: 6.1  Name of Provider Notified: Elink physician  Orders Received? Or Actions Taken?: Orders Received - See Orders for details and Actions Taken: collected type and screen and prepped for blood admin per order

## 2021-01-04 NOTE — Progress Notes (Addendum)
NAME:  [name], MRN:  [id number], DOB:  Apr 23, 1951, LOS: 1 ADMISSION DATE:  01/03/2021, CONSULTATION DATE:  01/03/21 REFERRING MD:  EDP at [facility], CHIEF COMPLAINT:  SOB,encephalopathy   History of Present Illness:  This is a 70 yo female with history of obesity, T2DM, chronic a fib on eliquis, dementia, diastolic HF, with chornic hypoxemic repsiratory failure, seizure disorder, CKD3, CAD with prior NSTEMI, Pulm HTN, hypothyroidism, and Dyslipidemia who presented for AMS x several days, increased WOB and SOB. O2 sat noted to 65% per EMS. Placed on non rebreather and O2 incrased to 100%. PC02 noted to 72. Patietn placed on bipap. Pateint non cooperative with bipap. Thus patient was intubated.   At baseline on 2L [place] at home.   Of note patient just recently admitted from 6/20-6/29 for acute on chronic hypoxic/hypercapnic respiratory failure 2/2 CHF exacerbations.    Pertinent  Medical History  obesity, type 2 diabetes,  chronic atrial fibrillation on Eliquis dementia diastolic CHF with chronic hypoxemia on 2 L nasal cannula  hypothyroidism seizure disorder CKD stage III CAD with prior NSTEMI,  dyslipidemia,  GERD NSTEMI 08/2020  Significant Hospital Events: Including procedures, antibiotic start and stop dates in addition to other pertinent events   Admitted to ICU on 01/03/21, transfer to [facility]  Interim History / Subjective:   Remains on the ventilator  Objective   Blood pressure (!) 133/46, pulse 75, temperature 98.3 F (36.8 C), temperature source Oral, resp. rate 20, height 5\' 8"  (1.727 m), weight 107.6 kg, SpO2 99 %.    Vent Mode: PRVC FiO2 (%):  [50 %-100 %] 50 % Set Rate:  [20 bmp] 20 bmp Vt Set:  [510 mL] 510 mL PEEP:  [5 cmH20] 5 cmH20 Plateau Pressure:  [21 cmH20-30 cmH20] 21 cmH20   Intake/Output Summary (Last 24 hours) at 01/04/2021 0800 Last data filed at 01/04/2021 0730 Gross per 24 hour  Intake 1049.19 ml  Output 375 ml  Net 674.19 ml   Filed Weights    01/03/21 1109 01/03/21 2157  Weight: 106.1 kg 107.6 kg    Examination: Blood pressure (!) 133/46, pulse 82, temperature 98.1 F (36.7 C), temperature source Axillary, resp. rate 20, height 5\' 8"  (1.727 m), weight 107.6 kg, SpO2 99 %. Gen:      No acute distress HEENT:  EOMI, sclera anicteric Neck:     No masses; no thyromegaly, ETT Lungs:    Clear to auscultation bilaterally; normal respiratory effort CV:         Regular rate and rhythm; no murmurs Abd:      + bowel sounds; soft, non-tender; no palpable masses, no distension Ext:    No edema; adequate peripheral perfusion Skin:      Warm and dry; no rash Neuro:  Sedated  Labs significant for PCT less than 0.1, hemoglobin 7.5  Resolved Hospital Problem list   NA  Assessment & Plan:  This is a 70 yo female who presents with AMS. Found to be in acute on chronic hypoxic and hypercapnic respiratory failure.  Acute combined hypoxic and hypercapnic respiratory failure Multifactorial secondary to CHF, pulmonary hypertension, restrictive lung disease secondary to kyphosis [no evidence of COPD], likely has a component of OSA/obesity hypoventilation Continue vent support, adjust ventilator for alkalosis on ABG Pressure support weans as tolerated Lasix for diuresis  Severe sepsis present on admission secondary to pneumonia, UTI CT reviewed with new infiltrate suggestive of pneumonia.  UA Continue broad antibiotic coverage, follow culture  Encephalopathy.  Metabolic  secondary to hypercarbia, sepsis History of stroke, seizure CT head with no acute abnormality Continue monitoring Keppra, Neurontin  A fib on eliquis Elevated troponin secondary to demand Pulm HTN-RVSP in 70's very large PA. RVSP on echo 5/22 Continue amiodarone 200 mg daily Eliquis changed to heparin drip Follow repeat echocardiogram  DM2 SSI  Mood disorder, dementia Chronic pain Cymbalta on hold Continue Neurontin  Hypothyroidism Continue home  synthroid TSH is normal  Goals of care This is her fourth admission this year. Per notes she is  mostly housbound on 2lpm 24/7 with baseline dementia May be headed towards a tracheostomy Doubt if she will be able to tolerate noninvasive ventilation at home due to dementia.  Daughter does not want to place her in a facility but is still considering a trach since she was trached 3 years ago and was decannulated. We discussed today that this time may be different and she may not be able to come off support, Will need to readdress goals of care.  Palliative care consult placed  Best Practice (right click and "Reselect all SmartList Selections" daily)   Diet/type: tubefeeds DVT prophylaxis: systemic heparin GI prophylaxis: PPI Lines: N/A Foley:  Yes, and it is still needed Code Status:  full code Last date of multidisciplinary goals of care discussion [TBD]  Critical care time:    The patient is critically ill with multiple organ system failure and requires high complexity decision making for assessment and support, frequent evaluation and titration of therapies, advanced monitoring, review of radiographic studies and interpretation of complex data.   Critical Care Time devoted to patient care services, exclusive of separately billable procedures, described in this note is 45 minutes.   Chilton Greathouse MD [facility] See Amion for pager  If no response to pager , please call [phone] until 7pm After 7:00 pm call Elink  [phone] 01/04/2021, 8:01 AM

## 2021-01-04 NOTE — Progress Notes (Signed)
  Echocardiogram 2D Echocardiogram with 3D and strain has been performed.  Leta Jungling M 01/04/2021, 8:14 AM

## 2021-01-04 NOTE — Progress Notes (Addendum)
eLink Physician-Brief Progress Note Patient Name: [name] DOB: 09-02-1950 MRN: [id number]   Date of Service  01/04/2021  HPI/Events of Note  Notified of hypotension 66/47  but would intermittently get normal to high BPS. Unable to get accurate arm pressure due to presence of PICC line on one arm and swelling on the other arm. No significant fluids given due to history of HF, EF preserved on echo  eICU Interventions  Will give a trial of 500 cc LR bolus and reassess  Remains hypotensive after a total of 1L LR bolus. Transient improvement in BP. Oozing from PICC line addressed by PICC team Ordered aline insertion and to start norepinephrine if confirmed hypotensive CBC to assess if transfusion needed     Intervention Category Major Interventions: Hypotension - evaluation and management  Darl Pikes 01/04/2021, 8:56 PM

## 2021-01-04 NOTE — Progress Notes (Signed)
Pharmacy Antibiotic Note  [name] is a 70 y.o. female admitted on 01/03/2021 with pneumonia.  Pharmacy has been consulted for Vancomycin/Cefepime dosing. Transfer [facility] on [facility] vent. WBC WNL. Renal function ok.   Plan: Vancomycin 1500 mg IV x 1, then 1250 mg IV q24h >>Estimated AUC: 491 Cefepime 2g IV q8h Trend WBC, temp, renal function  F/U infectious work-up Drug levels as indicated   Height: 5\' 8"  (172.7 cm) Weight: 107.6 kg (237 lb 3.4 oz) IBW/kg (Calculated) : 63.9  Temp (24hrs), Avg:98.4 F (36.9 C), Min:98.3 F (36.8 C), Max:98.5 F (36.9 C)  Recent Labs  Lab 12/28/20 0430 12/29/20 0442 12/30/20 0035 01/03/21 1255  WBC 8.1 8.9 7.4 9.6  CREATININE 0.91 0.79  --  1.00    Estimated Creatinine Clearance: 66.3 mL/min (by C-G formula based on SCr of 1 mg/dL).    Allergies  Allergen Reactions   Citalopram Hydrobromide Other (See Comments)    Dyskinesia Other reaction(s): Other (See Comments) Dyskinesia   Codeine Nausea And Vomiting and Other (See Comments)    HALLUCINATIONS Other reaction(s): Unknown Other reaction(s): Other (See Comments) HALLUCINATIONS    Hydromorphone Hcl Other (See Comments)    Made her pass out Other reaction(s): Other (See Comments) Made her pass out   Metoclopramide Other (See Comments)    DYSKINESIA Other reaction(s): Other (See Comments) DYSKINESIA   Hyoscyamine Other (See Comments)    MADE DIARRHEA WORSE    03/06/21, PharmD, BCPS Clinical Pharmacist Phone: [phone]

## 2021-01-04 NOTE — Progress Notes (Signed)
Initial Nutrition Assessment  DOCUMENTATION CODES:   Obesity unspecified  INTERVENTION:  Via OGT: -Begin Vital High Protein @ 52ml/hr, advance by 35ml/hr Q4H until goal rate of 68ml/hr is reached ( )  TF regimen and propofol at current rate providing 1572 total kcal/day (100% of kcal needs) and 115g protein (100% protein needs)  NUTRITION DIAGNOSIS:   Inadequate oral intake related to inability to eat as evidenced by NPO status  GOAL:   Provide needs based on ASPEN/SCCM guidelines  MONITOR:   Weight trends, TF tolerance, Labs, Vent status, Skin, I & O's  REASON FOR ASSESSMENT:   Consult Assessment of nutrition requirement/status, Enteral/tube feeding initiation and management  ASSESSMENT:   Pt with a PMH including T2DM, Afib, dementia, CHF, seizure disorder, CKD stage 3, CAD w/ prior NSTEMI, HTN, hypothyrodism, and dyslipidemia admitted with acute combined hypoxic and hypercapnic respiratory failure 2/2 CHF, HTN, restrictive lung disease 2/2 kyphosis. Note pt recently admitted from 6/20-6/29 for acute on chronic hypoxic hypercapnic respiratory failure 2/2 CHF exacerbations.  7/3 intubated  Discussed with RN. No family at bedside. This is pt's fourth admission this year. Per notes, pt is mostly housebound on 2L O2 at baseline. Per CCM, pt may be headed for trach. [name] has indicated that she is open to this, but does not want pt going to a facility. Palliative Medicine team has been consulted.   Patient is currently intubated on ventilator support MV: 10.4 L/min Temp (24hrs), Avg:98.4 F (36.9 C), Min:98.1 F (36.7 C), Max:98.7 F (37.1 C)  Propofol: 9.55 ml/hr, provides 252 kcals/d  No significant weight loss noted per available weight readings (see below); however, weight readings likely impacted by pt's fluid status given h/o CHF therefore making weight history difficult to truly assess.   UOP: documented x24 hours  Medications: lasix, SSI,  protonix Drips: fentanyl, propofol Labs: K+ 5.4 (H) CBGs 188-258   NUTRITION - FOCUSED PHYSICAL EXAM:  Flowsheet Row Most Recent Value  Orbital Region Mild depletion  Upper Arm Region No depletion  Thoracic and Lumbar Region No depletion  Buccal Region No depletion  Temple Region Mild depletion  Clavicle Bone Region No depletion  Clavicle and Acromion Bone Region No depletion  Scapular Bone Region No depletion  Dorsal Hand Mild depletion  Patellar Region No depletion  Anterior Thigh Region No depletion  Posterior Calf Region No depletion  Edema (RD Assessment) Moderate  [BUE and BLE]  Hair Reviewed  Eyes Unable to assess  Mouth Unable to assess  Skin Reviewed  Nails Reviewed       Diet Order:   Diet Order     None       EDUCATION NEEDS:   Not appropriate for education at this time  Skin:  Skin Assessment: Reviewed RN Assessment (skin tear R arm noted from recent admission)  Last BM:  PTA  Height:   Ht Readings from Last 1 Encounters:  01/03/21 5\' 8"  (1.727 m)    Weight:   Wt Readings from Last 1 Encounters:  01/03/21 107.6 kg    Ideal Body Weight:  63.63 kg  BMI:  Body mass index is 36.07 kg/m.  Estimated Nutritional Needs:   Kcal:  03/06/21 kcals  Protein:  110-125g  Fluid:  >2L    [phone], MS, RD, LDN (she/her/hers) RD pager number and weekend/on-call pager number located in Amion.

## 2021-01-04 NOTE — Progress Notes (Signed)
Peripherally Inserted Central Catheter Placement  The IV Nurse has discussed with the patient and/or persons authorized to consent for the patient, the purpose of this procedure and the potential benefits and risks involved with this procedure.  The benefits include less needle sticks, lab draws from the catheter, and the patient may be discharged home with the catheter. Risks include, but not limited to, infection, bleeding, blood clot (thrombus formation), and puncture of an artery; nerve damage and irregular heartbeat and possibility to perform a PICC exchange if needed/ordered by physician.  Alternatives to this procedure were also discussed.  Bard Power PICC patient education guide, fact sheet on infection prevention and patient information card has been provided to patient /or left at bedside.    PICC Placement Documentation  PICC Double Lumen 01/04/21 PICC Right Basilic 42 cm 0 cm (Active)  Indication for Insertion or Continuance of Line Prolonged intravenous therapies 01/04/21 1601  Exposed Catheter (cm) 0 cm 01/04/21 1601  Site Assessment Clean;Dry;Intact 01/04/21 1601  Lumen #1 Status Flushed;Blood return noted;Saline locked 01/04/21 1601  Lumen #2 Status Flushed;Blood return noted;Saline locked 01/04/21 1601  Dressing Type Transparent 01/04/21 1601  Dressing Status Clean;Dry;Intact 01/04/21 1601  Antimicrobial disc in place? Yes 01/04/21 1601  Dressing Change Due 01/11/21 01/04/21 1601       Audrie Gallus 01/04/2021, 4:03 PM

## 2021-01-05 ENCOUNTER — Inpatient Hospital Stay: Payer: Self-pay

## 2021-01-05 ENCOUNTER — Inpatient Hospital Stay (HOSPITAL_COMMUNITY): Payer: PPO

## 2021-01-05 DIAGNOSIS — J9621 Acute and chronic respiratory failure with hypoxia: Secondary | ICD-10-CM | POA: Diagnosis not present

## 2021-01-05 DIAGNOSIS — I482 Chronic atrial fibrillation, unspecified: Secondary | ICD-10-CM

## 2021-01-05 DIAGNOSIS — E876 Hypokalemia: Secondary | ICD-10-CM

## 2021-01-05 DIAGNOSIS — A419 Sepsis, unspecified organism: Secondary | ICD-10-CM

## 2021-01-05 DIAGNOSIS — R6521 Severe sepsis with septic shock: Secondary | ICD-10-CM

## 2021-01-05 LAB — PHOSPHORUS
Phosphorus: 2 mg/dL — ABNORMAL LOW (ref 2.5–4.6)
Phosphorus: 1.5 mg/dL — ABNORMAL LOW (ref 2.5–4.6)

## 2021-01-05 LAB — LEGIONELLA PNEUMOPHILA SEROGP 1 UR AG: L. pneumophila Serogp 1 Ur Ag: NEGATIVE

## 2021-01-05 LAB — CBC
HCT: 22.7 % — ABNORMAL LOW (ref 36.0–46.0)
HCT: 23.7 % — ABNORMAL LOW (ref 36.0–46.0)
Hemoglobin: 7.4 g/dL — ABNORMAL LOW (ref 12.0–15.0)
Hemoglobin: 7.4 g/dL — ABNORMAL LOW (ref 12.0–15.0)
MCH: 29.4 pg (ref 26.0–34.0)
MCH: 27.9 pg (ref 26.0–34.0)
MCHC: 32.6 g/dL (ref 30.0–36.0)
MCHC: 31.2 g/dL (ref 30.0–36.0)
MCV: 90.1 fL (ref 80.0–100.0)
MCV: 89.4 fL (ref 80.0–100.0)
Platelets: 214 10*3/uL (ref 150–400)
Platelets: 208 10*3/uL (ref 150–400)
RBC: 2.52 MIL/uL — ABNORMAL LOW (ref 3.87–5.11)
RBC: 2.65 MIL/uL — ABNORMAL LOW (ref 3.87–5.11)
RDW: 18.3 % — ABNORMAL HIGH (ref 11.5–15.5)
RDW: 18.6 % — ABNORMAL HIGH (ref 11.5–15.5)
WBC: 17.1 10*3/uL — ABNORMAL HIGH (ref 4.0–10.5)
WBC: 18 10*3/uL — ABNORMAL HIGH (ref 4.0–10.5)
nRBC: 0.5 % — ABNORMAL HIGH (ref 0.0–0.2)
nRBC: 0.4 % — ABNORMAL HIGH (ref 0.0–0.2)

## 2021-01-05 LAB — MAGNESIUM
Magnesium: 2.2 mg/dL (ref 1.7–2.4)
Magnesium: 2.1 mg/dL (ref 1.7–2.4)

## 2021-01-05 LAB — BASIC METABOLIC PANEL
Anion gap: 9 (ref 5–15)
Anion gap: 7 (ref 5–15)
BUN: 22 mg/dL (ref 8–23)
BUN: 20 mg/dL (ref 8–23)
CO2: 26 mmol/L (ref 22–32)
CO2: 25 mmol/L (ref 22–32)
Calcium: 8 mg/dL — ABNORMAL LOW (ref 8.9–10.3)
Calcium: 7.6 mg/dL — ABNORMAL LOW (ref 8.9–10.3)
Chloride: 102 mmol/L (ref 98–111)
Chloride: 102 mmol/L (ref 98–111)
Creatinine, Ser: 1.31 mg/dL — ABNORMAL HIGH (ref 0.44–1.00)
Creatinine, Ser: 1.17 mg/dL — ABNORMAL HIGH (ref 0.44–1.00)
GFR, Estimated: 44 mL/min — ABNORMAL LOW (ref >60)
GFR, Estimated: 50 mL/min — ABNORMAL LOW (ref >60)
Glucose, Bld: 207 mg/dL — ABNORMAL HIGH (ref 70–99)
Glucose, Bld: 203 mg/dL — ABNORMAL HIGH (ref 70–99)
Potassium: 3.3 mmol/L — ABNORMAL LOW (ref 3.5–5.1)
Potassium: 3.7 mmol/L (ref 3.5–5.1)
Sodium: 137 mmol/L (ref 135–145)
Sodium: 134 mmol/L — ABNORMAL LOW (ref 135–145)

## 2021-01-05 LAB — APTT
aPTT: 184 seconds (ref 24–36)
aPTT: 117 seconds — ABNORMAL HIGH (ref 24–36)
aPTT: 135 seconds — ABNORMAL HIGH (ref 24–36)

## 2021-01-05 LAB — HEPARIN LEVEL (UNFRACTIONATED): Heparin Unfractionated: >1.1 IU/mL — ABNORMAL HIGH (ref 0.30–0.70)

## 2021-01-05 LAB — GLUCOSE, CAPILLARY
Glucose-Capillary: 191 mg/dL — ABNORMAL HIGH (ref 70–99)
Glucose-Capillary: 196 mg/dL — ABNORMAL HIGH (ref 70–99)
Glucose-Capillary: 196 mg/dL — ABNORMAL HIGH (ref 70–99)
Glucose-Capillary: 196 mg/dL — ABNORMAL HIGH (ref 70–99)
Glucose-Capillary: 212 mg/dL — ABNORMAL HIGH (ref 70–99)
Glucose-Capillary: 213 mg/dL — ABNORMAL HIGH (ref 70–99)

## 2021-01-05 LAB — PROCALCITONIN: Procalcitonin: <0.1 ng/mL

## 2021-01-05 MED ORDER — INSULIN GLARGINE 100 UNIT/ML ~~LOC~~ SOLN
15.0000 [IU] | Freq: Every day | SUBCUTANEOUS | Status: DC
  Administered 2021-01-05 – 2021-01-06 (×2): 15 [IU] via SUBCUTANEOUS
  Filled 2021-01-05 (×2): qty 0.15

## 2021-01-05 MED ORDER — K PHOS MONO-SOD PHOS DI & MONO 155-852-130 MG PO TABS
500.0000 mg | ORAL_TABLET | Freq: Two times a day (BID) | ORAL | Status: AC
  Administered 2021-01-05 (×2): 500 mg
  Filled 2021-01-05 (×2): qty 2

## 2021-01-05 MED ORDER — POTASSIUM CHLORIDE 20 MEQ PO PACK
20.0000 meq | PACK | ORAL | Status: AC
Start: 2021-01-05 — End: 2021-01-05
  Administered 2021-01-05 (×2): 20 meq
  Filled 2021-01-05 (×2): qty 1

## 2021-01-05 MED ORDER — POTASSIUM PHOSPHATES 15 MMOLE/5ML IV SOLN
45.0000 mmol | Freq: Once | INTRAVENOUS | Status: AC
  Administered 2021-01-05: 45 mmol via INTRAVENOUS
  Filled 2021-01-05: qty 15

## 2021-01-05 MED ORDER — HEPARIN (PORCINE) 25000 UT/250ML-% IV SOLN
950.0000 [IU]/h | INTRAVENOUS | Status: DC
  Administered 2021-01-05 – 2021-01-06 (×2): 950 [IU]/h via INTRAVENOUS
  Filled 2021-01-05 (×2): qty 250

## 2021-01-05 MED ORDER — POTASSIUM CHLORIDE 10 MEQ/50ML IV SOLN
10.0000 meq | INTRAVENOUS | Status: AC
  Administered 2021-01-05 (×4): 10 meq via INTRAVENOUS
  Filled 2021-01-05 (×4): qty 50

## 2021-01-05 MED ORDER — SODIUM CHLORIDE 0.9 % IV SOLN
2.0000 g | Freq: Two times a day (BID) | INTRAVENOUS | Status: DC
  Administered 2021-01-05 – 2021-01-08 (×6): 2 g via INTRAVENOUS
  Filled 2021-01-05 (×6): qty 2

## 2021-01-05 MED ORDER — HEPARIN (PORCINE) 25000 UT/250ML-% IV SOLN
1100.0000 [IU]/h | INTRAVENOUS | Status: DC
  Administered 2021-01-05: 1100 [IU]/h via INTRAVENOUS

## 2021-01-05 NOTE — Progress Notes (Signed)
ANTICOAGULATION CONSULT NOTE  Pharmacy Consult for IV Heparin Indication: atrial fibrillation  Patient Measurements: Height: 5\' 8"  (172.7 cm) Weight: 106.4 kg (234 lb 9.1 oz) IBW/kg (Calculated) : 63.9 Heparin Dosing Weight: 87.8 kg   Vital Signs: Temp: 98.8 F (37.1 C) (07/05 1535) Temp Source: Axillary (07/05 1535) BP: 139/59 (07/05 1300) Pulse Rate: 74 (07/05 1445)  Labs: Recent Labs    01/03/21 1255 01/03/21 1527 01/03/21 1728 01/03/21 2326 01/04/21 0358 01/04/21 1216 01/04/21 2356 01/05/21 0438 01/05/21 0439 01/05/21 1410  HGB 9.1*  --   --    < > 7.5*  --  7.4* 7.4*  --   --   HCT 31.6*  --   --    < > 24.5*  --  22.7* 23.7*  --   --   PLT 243  --   --   --  195  --  214 208  --   --   APTT  --   --   --   --  71*   < > 184* 117*  --  135*  LABPROT  --   --   --   --  23.5*  --   --   --   --   --   INR  --   --   --   --  2.1*  --   --   --   --   --   HEPARINUNFRC  --   --   --   --  >1.10*  --   --   --  >1.10*  --   CREATININE 1.00  --   --   --   --   --   --  1.31*  --   --   TROPONINIHS  --  108* 102*  --   --   --   --   --   --   --    < > = values in this interval not displayed.    Estimated Creatinine Clearance: 50.3 mL/min (A) (by C-G formula based on SCr of 1.31 mg/dL (H)).  Assessment: 70 yr old female with hx atrial fibrillation, for which was on apixaban PTA (last dose at 01/03/21 at 0800). Pharmacy was consulted to dose IV heparin while apixaban is on hold. Given recent apixaban exposure, will monitor anticoagulation using aPTT until heparin level and aPTT correlate.  aPTT ~6 hrs after heparin infusion was held X 90 minutes, then restarted at 1100 units/hr, was 135 sec, which is above the goal range for this pt. H/H 7.4/23.7, plt 208 (CBC stable). Per RN, no issues with IV or bleeding observed.  Goal of Therapy:  Heparin level 0.3-0.7 units/ml aPTT 66-102 seconds Monitor platelets by anticoagulation protocol: Yes   Plan:  Hold heparin  infusion X 1 hr, then decrease heparin infusion to 950 units/hr Check aPTT/heparin level 8 hrs after restarting heparin infusion Monitor daily aPTT, heparin level, CBC Monitor for bleeding  04-19-1991, PharmD, BCPS, [facility] Clinical Pharmacist

## 2021-01-05 NOTE — Progress Notes (Signed)
[facility] ADULT ICU REPLACEMENT PROTOCOL   The patient does apply for the [facility] Adult ICU Electrolyte Replacment Protocol based on the criteria listed below:   1. Is GFR >/= 30 ml/min? Yes.    Patient's GFR today is 44 2. Is SCr </= 2? Yes.   Patient's SCr is 1.31 ml/kg/hr 3. Did SCr increase >/= 0.5 in 24 hours? No. 4. Abnormal electrolyte(s): k+ 3.3 5. Ordered repletion with: standing 6. If a panic level lab has been reported, has the CCM MD in charge been notified? No..   Physician:    Melvern Banker 01/05/2021 6:03 AM

## 2021-01-05 NOTE — Progress Notes (Signed)
Pharmacy Antibiotic Note  [name] is a 70 y.o. female admitted on 01/03/2021 with pneumonia.  Pharmacy has been consulted for Cefepime dosing.  SCr up to 1.31 << 1, CrCl~50 ml/min. Will adjust Cefepime dose.   Plan: - Adjust Cefepime to 2g IV every 12 hours - Will continue to follow renal function, culture results, LOT, and antibiotic de-escalation plans   Height: 5\' 8"  (172.7 cm) Weight: 106.4 kg (234 lb 9.1 oz) IBW/kg (Calculated) : 63.9  Temp (24hrs), Avg:98.3 F (36.8 C), Min:97.9 F (36.6 C), Max:98.8 F (37.1 C)  Recent Labs  Lab 12/30/20 0035 01/03/21 1255 01/04/21 0358 01/04/21 2356 01/05/21 0438  WBC 7.4 9.6 7.0 17.1* 18.0*  CREATININE  --  1.00  --   --  1.31*     Estimated Creatinine Clearance: 50.3 mL/min (A) (by C-G formula based on SCr of 1.31 mg/dL (H)).    Allergies  Allergen Reactions   Citalopram Hydrobromide Other (See Comments)    Dyskinesia Other reaction(s): Other (See Comments) Dyskinesia   Codeine Nausea And Vomiting and Other (See Comments)    HALLUCINATIONS Other reaction(s): Unknown Other reaction(s): Other (See Comments) HALLUCINATIONS    Hydromorphone Hcl Other (See Comments)    Made her pass out Other reaction(s): Other (See Comments) Made her pass out   Metoclopramide Other (See Comments)    DYSKINESIA Other reaction(s): Other (See Comments) DYSKINESIA   Hyoscyamine Other (See Comments)    MADE DIARRHEA WORSE    Vancomycin 7/4>> 7/5 Cefepime 7/4>> Azithromycin 7/3>>   7/3 Fluvid >> neg 7/3 MRSA PCR >> neg 7/3 RCx (TA) >> rare GPC in clusters on GS >> ng<12h 7/4 BCx >> ngtd  Thank you for allowing pharmacy to be a part of this patient's care.  9/5, PharmD, BCPS Clinical Pharmacist Clinical phone for 01/05/2021: [phone] 01/05/2021 1:15 PM   **Pharmacist phone directory can now be found on [email or web address] ([email or web address]).  Listed under [facility].

## 2021-01-05 NOTE — Progress Notes (Signed)
Notified [name], [facility] of pt's aPTT 135 01/05/21 at 1410; she verbalized understanding and will pass this value onto the appropriate RPH.

## 2021-01-05 NOTE — Progress Notes (Signed)
ANTICOAGULATION CONSULT NOTE  Pharmacy Consult for heparin Indication: atrial fibrillation  Patient Measurements: Height: 5\' 8"  (172.7 cm) Weight: 107.6 kg (237 lb 3.4 oz) IBW/kg (Calculated) : 63.9 Heparin Dosing Weight: 87.8 kg   Vital Signs: Temp: 98 F (36.7 C) (07/04 2329) Temp Source: Oral (07/04 2329) BP: 103/39 (07/05 0200) Pulse Rate: 67 (07/05 0230)  Labs: Recent Labs    01/03/21 1255 01/03/21 1527 01/03/21 1728 01/03/21 2326 01/04/21 0358 01/04/21 1216 01/04/21 2356  HGB 9.1*  --   --  8.2* 7.5*  --  7.4*  HCT 31.6*  --   --  24.0* 24.5*  --  22.7*  PLT 243  --   --   --  195  --  214  APTT  --   --   --   --  71* >200* 184*  LABPROT  --   --   --   --  23.5*  --   --   INR  --   --   --   --  2.1*  --   --   HEPARINUNFRC  --   --   --   --  >1.10*  --   --   CREATININE 1.00  --   --   --   --   --   --   TROPONINIHS  --  108* 102*  --   --   --   --      Estimated Creatinine Clearance: 66.3 mL/min (by C-G formula based on SCr of 1 mg/dL).  Assessment: 70 yo female with h/o Afib, Eliquis on hold, for heparin  Goal of Therapy:  Heparin level 0.3-0.7 units/ml aPTT 66-102 seconds Monitor platelets by anticoagulation protocol: Yes   Plan:  Hold heparin x 90 minutes, then decrease heparin 1100 units/hr Check aPTT in 8 hours.   62, PharmD, BCPS

## 2021-01-05 NOTE — Progress Notes (Signed)
NAME:  [name], MRN:  [id number], DOB:  02-May-1951, LOS: 2 ADMISSION DATE:  01/03/2021, CONSULTATION DATE:  01/03/21 REFERRING MD:  EDP at [facility], CHIEF COMPLAINT:  SOB,encephalopathy   History of Present Illness:  This is a 70 yo female with history of obesity, T2DM, chronic a fib on eliquis, dementia, diastolic HF, with chronic hypoxemic repsiratory failure, seizure disorder, CKD3, CAD with prior NSTEMI, Pulm HTN, hypothyroidism, and Dyslipidemia who presented for AMS x several days, increased WOB and SOB. O2 sat noted to 65% per EMS. Placed on non rebreather and O2 incrased to 100%. PC02 noted to 72. Patient placed on bipap. Pateint non cooperative with bipap. Thus patient was intubated.   At baseline on 2L [place] at home.   Of note patient just recently admitted from 6/20-6/29 for acute on chronic hypoxic/hypercapnic respiratory failure 2/2 CHF exacerbations.   She remains critically ill in the 43M ICU. Pertinent  Medical History  obesity, type 2 diabetes,  chronic atrial fibrillation on Eliquis dementia diastolic CHF with chronic hypoxemia on 2 L nasal cannula  hypothyroidism seizure disorder CKD stage III CAD with prior NSTEMI,  dyslipidemia,  GERD NSTEMI 08/2020  Significant Hospital Events: Including procedures, antibiotic start and stop dates in addition to other pertinent events   Admitted to ICU on 01/03/21, transfer to [facility]. 7/3 CT LLL PNU.  7/4-7/5 Hypotension overnight. A line placed. LR bolus. Started on NE  Interim History / Subjective:  Hypotension overnight. A line placed. LR bolus. Started on NE  Tmax 98.8  +7102ml admit, 3.8L uop  Intubated/sedated  Heparin gtt, propofol 40, fentanyl 100, levophed  Unable to obtain subjective evaluation due to patient status  Objective   Blood pressure (!) 101/47, pulse 71, temperature 97.9 F (36.6 C), temperature source Axillary, resp. rate 20, height 5\' 8"  (1.727 m), weight 106.4 kg, SpO2 100  %. CVP:  [5 mmHg] 5 mmHg  Vent Mode: PRVC FiO2 (%):  [40 %-50 %] 40 % Set Rate:  [20 bmp] 20 bmp Vt Set:  [510 mL] 510 mL PEEP:  [5 cmH20] 5 cmH20 Plateau Pressure:  [20 cmH20-50 cmH20] 26 cmH20   Intake/Output Summary (Last 24 hours) at 01/05/2021 1128 Last data filed at 01/05/2021 1009 Gross per 24 hour  Intake 4250.3 ml  Output 4095 ml  Net 155.3 ml   Filed Weights   01/03/21 1109 01/03/21 2157 01/05/21 0605  Weight: 106.1 kg 107.6 kg 106.4 kg    Examination: General:  in bed, NAE, Appears comfortable HEENT: MM pink/moist, anicteric, trachea midline  Neuro: Sedated, PERRL 24mm CV: S1S2, afib on monitor, no m/r/g appreciated PULM:  air movement in the upper lobes and in the lower lobes, scant secretions, chest expansion symmetric GI: soft, bsx4 active, nondistended   Extremities: warm/dry, no pretibial edema, capillary refill greater/less than 3 seconds  Skin: no rashes or lesions   Resolved Hospital Problem list   NA  Assessment & Plan:  This is a 70 yo female who presents with AMS. Found to be in acute on chronic hypoxic and hypercapnic respiratory failure.  Acute combined hypoxic and hypercapnic respiratory failure Multifactorial secondary to CHF, pulmonary hypertension, restrictive lung disease secondary to kyphosis [no evidence of COPD], likely has a component of OSA/obesity hypoventilation On 2L [place] at home -LTVV strategy with tidal volumes of 4-8 cc/kg ideal body weight -Goal plateau pressures of 30 and driving pressures of 15 -Wean PEEP/FiO2 for SpO2 greater than 92% -Daily SAT/SBT -Continue VAP bundle  precautions -Follow intermittent CXR and ABG PRN -Scheduled AM Lasix dose held due to levophed requirement. Continue to assess volume status and blood pressure.  Septic shock present on admission secondary to pneumonia, CT reviewed with new infiltrate suggestive of pneumonia. Hypotension overnight. A line placed. LR bolus. CXR bibasilar  infiltrate/atelectasis. -MSRA nasal -,Stopped vancomycin. -Continue cefepime and azitrhomycin -Follow up blood cultures and TA. Wean abx as cultures result. -Continue levophed via picc. Goal MAP 65 or greater. Titrate to goal.  Encephalopathy.  Metabolic secondary to hypercarbia, sepsis History of stroke, seizure CT head, no acute process. -Continue Keppra 1000mg  BID -Continue Neurontin 100mg  q8h -Monitor neuro exam -PAD bundle with propofol and fentanyl gtt. Goal RASS -1. Titrate to goal.  A fib on eliquis Elevated troponin secondary to demand Pulm HTN-RVSP in 70's very large PA. RVSP on echo 5/22. In Afib. Rate controlled. New ECHO LVEF 45-50%. Global hypokinesis. Mod concentric LVH. RVSF mod reduced. Est RVSP .  -Holding eliquis pending trach discussion, continue heparin gtt -Continue amioadarone 200mg  daily  DM2 BG 123-196. On TF -Continue SSI -Adding lantus  Mood disorder, dementia Chronic pain -Continue Neurontin 100mg  q8h -Holding cymbalta  Hypothyroidism -Continue home synthroid, 6/22 daily  Hx CKD3 Creat 1.0 on admission now. 1.31. Suspect bump secondary to hypotension overnight. -Ensure renal perfusion. Goal MAP 65 or greater. -Avoid neprotoxic drugs as possible. -Strict I&O's -Follow up AM creatinine  Acute electrolyte abnormalities- Hypokalemia, hypophosphatemia K 3.3, Phos 2.0, mag 2.2 -repleted IV and per tube -follow up am BMP  Goals of care See below  Dr. 7/4 This is her fourth admission this year. Per notes she is  mostly housbound on 2lpm 24/7 with baseline dementia May be headed towards a tracheostomy Doubt if she will be able to tolerate noninvasive ventilation at home due to dementia.  Daughter does not want to place her in a facility but is still considering a trach since she was trached 3 years ago and was decannulated. We discussed today that this time may be different and she may not be able to come off  support, Will need to readdress goals of care.  Palliative care consult placed  Best Practice (right click and "Reselect all SmartList Selections" daily)   Diet/type: tubefeeds DVT prophylaxis: systemic heparin GI prophylaxis: PPI Lines: yes and it is still needed Foley:  Yes, and it is still needed Code Status:  full code Last date of multidisciplinary goals of care discussion [7/4]  Critical care time: 31 minutes   The patient is critically ill with multiple organ system failure and requires high complexity decision making for assessment and support, frequent evaluation and titration of therapies, advanced monitoring, review of radiographic studies and interpretation of complex data.   ., MSN, APRN, AGACNP-BC [facility]  01/05/2021 , 11:28 AM  Please see [email or web address] for pager details  If no response, please call [phone] After hours, please call Elink at [phone]

## 2021-01-05 NOTE — Procedures (Signed)
Arterial Catheter Insertion Procedure Note  BRYLIE SNEATH  [id number]  1951/06/17  Date:01/05/21  Time:12:19 AM    Provider Performing: Inez Pilgrim    Procedure: Insertion of Arterial Line (45364) without US guidance  Indication(s) Blood pressure monitoring and/or need for frequent ABGs  Consent Unable to obtain consent due to emergent nature of procedure.  Anesthesia None   Time Out Verified patient identification, verified procedure, site/side was marked, verified correct patient position, special equipment/implants available, medications/allergies/relevant history reviewed, required imaging and test results available.   Sterile Technique Maximal sterile technique including full sterile barrier drape, hand hygiene, sterile gown, sterile gloves, mask, hair covering, sterile ultrasound probe cover (if used).   Procedure Description Area of catheter insertion was cleaned with chlorhexidine and draped in sterile fashion. Without real-time ultrasound guidance an arterial catheter was placed into the left radial artery.  Appropriate arterial tracings confirmed on monitor.     Complications/Tolerance None; patient tolerated the procedure well.   EBL Minimal   Specimen(s) None

## 2021-01-06 ENCOUNTER — Inpatient Hospital Stay (HOSPITAL_COMMUNITY): Payer: PPO

## 2021-01-06 DIAGNOSIS — J9621 Acute and chronic respiratory failure with hypoxia: Secondary | ICD-10-CM | POA: Diagnosis not present

## 2021-01-06 DIAGNOSIS — Z95828 Presence of other vascular implants and grafts: Secondary | ICD-10-CM

## 2021-01-06 DIAGNOSIS — D649 Anemia, unspecified: Secondary | ICD-10-CM

## 2021-01-06 DIAGNOSIS — J9622 Acute and chronic respiratory failure with hypercapnia: Secondary | ICD-10-CM | POA: Diagnosis not present

## 2021-01-06 DIAGNOSIS — R410 Disorientation, unspecified: Secondary | ICD-10-CM

## 2021-01-06 DIAGNOSIS — J9602 Acute respiratory failure with hypercapnia: Secondary | ICD-10-CM | POA: Diagnosis not present

## 2021-01-06 DIAGNOSIS — J9601 Acute respiratory failure with hypoxia: Secondary | ICD-10-CM | POA: Diagnosis not present

## 2021-01-06 LAB — HEPARIN LEVEL (UNFRACTIONATED)
Heparin Unfractionated: >1.1 IU/mL — ABNORMAL HIGH (ref 0.30–0.70)
Heparin Unfractionated: >1.1 IU/mL — ABNORMAL HIGH (ref 0.30–0.70)

## 2021-01-06 LAB — CBC
HCT: 20.7 % — ABNORMAL LOW (ref 36.0–46.0)
Hemoglobin: 6.7 g/dL — CL (ref 12.0–15.0)
MCH: 28.3 pg (ref 26.0–34.0)
MCHC: 32.4 g/dL (ref 30.0–36.0)
MCV: 87.3 fL (ref 80.0–100.0)
Platelets: 158 10*3/uL (ref 150–400)
RBC: 2.37 MIL/uL — ABNORMAL LOW (ref 3.87–5.11)
RDW: 18.4 % — ABNORMAL HIGH (ref 11.5–15.5)
WBC: 14.2 10*3/uL — ABNORMAL HIGH (ref 4.0–10.5)
nRBC: 0.3 % — ABNORMAL HIGH (ref 0.0–0.2)

## 2021-01-06 LAB — BASIC METABOLIC PANEL
Anion gap: 9 (ref 5–15)
BUN: 19 mg/dL (ref 8–23)
CO2: 26 mmol/L (ref 22–32)
Calcium: 7.7 mg/dL — ABNORMAL LOW (ref 8.9–10.3)
Chloride: 103 mmol/L (ref 98–111)
Creatinine, Ser: 1.19 mg/dL — ABNORMAL HIGH (ref 0.44–1.00)
GFR, Estimated: 49 mL/min — ABNORMAL LOW (ref >60)
Glucose, Bld: 236 mg/dL — ABNORMAL HIGH (ref 70–99)
Potassium: 3.9 mmol/L (ref 3.5–5.1)
Sodium: 138 mmol/L (ref 135–145)

## 2021-01-06 LAB — HEMOGLOBIN AND HEMATOCRIT, BLOOD
HCT: 23.4 % — ABNORMAL LOW (ref 36.0–46.0)
Hemoglobin: 7.6 g/dL — ABNORMAL LOW (ref 12.0–15.0)

## 2021-01-06 LAB — GLUCOSE, CAPILLARY
Glucose-Capillary: 143 mg/dL — ABNORMAL HIGH (ref 70–99)
Glucose-Capillary: 158 mg/dL — ABNORMAL HIGH (ref 70–99)
Glucose-Capillary: 214 mg/dL — ABNORMAL HIGH (ref 70–99)
Glucose-Capillary: 215 mg/dL — ABNORMAL HIGH (ref 70–99)
Glucose-Capillary: 231 mg/dL — ABNORMAL HIGH (ref 70–99)
Glucose-Capillary: 71 mg/dL (ref 70–99)

## 2021-01-06 LAB — MAGNESIUM: Magnesium: 2.2 mg/dL (ref 1.7–2.4)

## 2021-01-06 LAB — PHOSPHORUS: Phosphorus: 4.3 mg/dL (ref 2.5–4.6)

## 2021-01-06 LAB — LIPASE, BLOOD: Lipase: 53 U/L — ABNORMAL HIGH (ref 11–51)

## 2021-01-06 LAB — APTT
aPTT: 100 seconds — ABNORMAL HIGH (ref 24–36)
aPTT: 95 seconds — ABNORMAL HIGH (ref 24–36)

## 2021-01-06 LAB — AMYLASE: Amylase: 60 U/L (ref 28–100)

## 2021-01-06 LAB — PREPARE RBC (CROSSMATCH)

## 2021-01-06 LAB — TRIGLYCERIDES: Triglycerides: 55 mg/dL (ref <150)

## 2021-01-06 MED ORDER — INSULIN GLARGINE 100 UNIT/ML ~~LOC~~ SOLN
20.0000 [IU] | Freq: Every day | SUBCUTANEOUS | Status: DC
  Administered 2021-01-09 – 2021-01-10 (×2): 20 [IU] via SUBCUTANEOUS
  Filled 2021-01-06 (×5): qty 0.2

## 2021-01-06 MED ORDER — VECURONIUM BROMIDE 10 MG IV SOLR
10.0000 mg | Freq: Once | INTRAVENOUS | Status: AC
  Administered 2021-01-07: 10 mg via INTRAVENOUS
  Filled 2021-01-06: qty 10

## 2021-01-06 MED ORDER — INSULIN ASPART 100 UNIT/ML IJ SOLN
2.0000 [IU] | INTRAMUSCULAR | Status: DC
  Administered 2021-01-06 – 2021-01-10 (×12): 2 [IU] via SUBCUTANEOUS

## 2021-01-06 MED ORDER — SODIUM CHLORIDE 0.9% IV SOLUTION: Freq: Once | INTRAVENOUS | Status: AC

## 2021-01-06 MED ORDER — MIDAZOLAM HCL 2 MG/2ML IJ SOLN
5.0000 mg | Freq: Once | INTRAMUSCULAR | Status: DC
  Filled 2021-01-06: qty 6

## 2021-01-06 MED ORDER — FENTANYL CITRATE (PF) 100 MCG/2ML IJ SOLN
200.0000 ug | Freq: Once | INTRAMUSCULAR | Status: DC
  Filled 2021-01-06: qty 4

## 2021-01-06 MED ORDER — DEXTROSE 10 % IV SOLN: INTRAVENOUS | Status: DC

## 2021-01-06 MED ORDER — ETOMIDATE 2 MG/ML IV SOLN
40.0000 mg | Freq: Once | INTRAVENOUS | Status: AC
  Administered 2021-01-07: 20 mg via INTRAVENOUS
  Filled 2021-01-06: qty 20

## 2021-01-06 NOTE — Progress Notes (Signed)
eLink Physician-Brief Progress Note Patient Name: [name] DOB: 05-09-1951 MRN: [id number]   Date of Service  01/06/2021  HPI/Events of Note  Hemoglobin 6.7 gm / dl. Patient has ecchymotic areas of the skin of her forearms but  has not had melena, coffee grounds emesis or hematuria, platelet count within normal limits but PTT elevated consistent with Heparinization.  eICU Interventions  CT abd / pelvis wo contrast r/o retroperitoneal hematoma, transfuse 1 unit PRBC, trend H & H.        [name] [name] 01/06/2021, 6:14 AM

## 2021-01-06 NOTE — Progress Notes (Signed)
ANTICOAGULATION CONSULT NOTE  Pharmacy Consult for IV Heparin Indication: atrial fibrillation  Patient Measurements: Height: 5\' 8"  (172.7 cm) Weight: 107.4 kg (236 lb 12.4 oz) IBW/kg (Calculated) : 63.9 Heparin Dosing Weight: 87.8 kg   Vital Signs: Temp: 97.8 F (36.6 C) (07/06 0735) Temp Source: Axillary (07/06 0735) BP: 122/94 (07/06 0630) Pulse Rate: 72 (07/06 0700)  Labs: Recent Labs    01/03/21 1527 01/03/21 1728 01/03/21 2326 01/04/21 0358 01/04/21 1216 01/04/21 2356 01/05/21 0438 01/05/21 0439 01/05/21 1410 01/05/21 1948 01/06/21 0018 01/06/21 0443  HGB  --   --    < > 7.5*  --  7.4* 7.4*  --   --   --   --  6.7*  HCT  --   --    < > 24.5*  --  22.7* 23.7*  --   --   --   --  20.7*  PLT  --   --   --  195  --  214 208  --   --   --   --  158  APTT  --   --   --  71*   < > 184* 117*  --  135*  --  100* 95*  LABPROT  --   --   --  23.5*  --   --   --   --   --   --   --   --   INR  --   --   --  2.1*  --   --   --   --   --   --   --   --   HEPARINUNFRC  --   --    < > >1.10*  --   --   --  >1.10*  --   --  >1.10* >1.10*  CREATININE  --   --   --   --   --   --  1.31*  --   --  1.17*  --  1.19*  TROPONINIHS 108* 102*  --   --   --   --   --   --   --   --   --   --    < > = values in this interval not displayed.    Estimated Creatinine Clearance: 55.7 mL/min (A) (by C-G formula based on SCr of 1.19 mg/dL (H)).  Assessment: 70 yr old female with hx atrial fibrillation, for which was on apixaban PTA (last dose at 01/03/21 at 0800). Pharmacy was consulted to dose IV heparin while apixaban is on hold. Given recent apixaban exposure, will monitor anticoagulation using aPTT until heparin level and aPTT correlate.  aPTT therapeutic this morning - now therapeutic x 2. HL still falsely high. Hgb slow trend down to 6.7 << 7.4, imaging neg for RP bleed, some oozing from skin wound, no other bleeding. CCM okay with continuing.   Goal of Therapy:  Heparin level 0.3-0.7  units/ml aPTT 66-102 seconds Monitor platelets by anticoagulation protocol: Yes   Plan:  - Continue Heparin at 950 units/hr - Daily aPTT/HL until correlated - Will continue to monitor for any signs/symptoms of bleeding and will follow up with aPTT level in the a.m.   Thank you for allowing pharmacy to be a part of this patient's care.  03/06/21, PharmD, BCPS Clinical Pharmacist Clinical phone for 01/06/2021: 03/09/2021 01/06/2021 11:03 AM   **Pharmacist phone directory can now be found on [email or web address] ([email or web address]).  Listed under [facility]  Pharmacy.

## 2021-01-06 NOTE — Progress Notes (Addendum)
NAME:  [name], MRN:  [id number], DOB:  22-Dec-1950, LOS: 3 ADMISSION DATE:  01/03/2021, CONSULTATION DATE:  01/03/2021 REFERRING MD:  EDP - [facility] CHIEF COMPLAINT:  SOB,encephalopathy   History of Present Illness:  70 year old female with PMHx significant for obesity, hypothyroidism, T2DM, chronic Afib (on Eliquis), CAD with prior NSTEMI, diastolic HF (last Echo 7/4 EF 40-98%), pulmonary HTN, chronic hypoxemic respiratory failure (home baseline O2 requirement 2L [place]), seizure disorder, CKD3, dementia who presented to [facility] 7/3 for AMS, increased WOB and SOB. Transferred to [facility]. O2 sat noted as low as 65% per EMS. Placed on NRB with sat improving to 100%, though PCO2 noted to be 72. Patient placed on BiPAP, but uncooperative/unable to tolerate and subsequently intubated.  Of note, patient recently admitted 6/20 - 6/29 for acute-on-chronic hypoxic/hypercapnic respiratory failure 2/2 CHF exacerbation.   She remains critically ill in the 60M ICU. Pertinent  Medical History  Obesity Type 2 diabetes,  Chronic atrial fibrillation on Eliquis Dementia Diastolic CHF with chronic hypoxemia on 2 L nasal cannula Hypothyroidism Seizure disorder CKD stage III CAD with prior NSTEMI,  Dyslipidemia,  GERD NSTEMI 08/2020  Significant Hospital Events: Including procedures, antibiotic start and stop dates in addition to other pertinent events   Admitted to ICU on 01/03/21, transfer to [facility]. 7/3 CT LLL PNU.  7/4-7/5 Hypotension overnight. A line placed. LR bolus. Started on NE 7/6 Hgb 6.7 (7.4), CT A/P completed to r/o RP bleed (negative), showed mild peripancreatic stranding. Lipase slightly elevated. 1U PRBCs ordered.   Interim History / Subjective:  No significant events overnight Remains nonresponsive on exam (sedated on Propofol) Hgb drift to 6.7 (7.4) CT A/P completed, negative for RP bleed but demonstrated mild peripancreatic stranding Lipase mildly elevated at 53 1U PRBCs ordered, daughter at  bedside consented/informed  Objective:  Blood pressure (!) 122/94, pulse 72, temperature 97.8 F (36.6 C), temperature source Axillary, resp. rate 20, height 5\' 8"  (1.727 m), weight 107.4 kg, SpO2 99 %. CVP:  [12 mmHg] 12 mmHg  Vent Mode: PRVC FiO2 (%):  [40 %] 40 % Set Rate:  [20 bmp] 20 bmp Vt Set:  [510 mL] 510 mL PEEP:  [5 cmH20] 5 cmH20 Plateau Pressure:  [20 cmH20-21 cmH20] 21 cmH20   Intake/Output Summary (Last 24 hours) at 01/06/2021 0811 Last data filed at 01/06/2021 0700 Gross per 24 hour  Intake 3959.63 ml  Output 3160 ml  Net 799.63 ml    Filed Weights   01/03/21 2157 01/05/21 0605 01/06/21 0408  Weight: 107.6 kg 106.4 kg 107.4 kg   Physical Examination: General: Chronically ill-appearing elderly woman in NAD. HEENT: [place]/AT, anicteric sclera, pupils 15mm and pinpoint, slightly dry mucous membranes. ETT in place. Neuro: Sedated. Responds to noxious stimuli. Not following commands. +Cough and +Gag  CV: RRR, no m/g/r. PULM: Breathing even and unlabored on vent (PEEP 5, FiO2 40%). Lung fields diminished throughout. GI: Soft, nontender, nondistended. Normoactive bowel sounds. Extremities: Bilateral symmetric 1-2+ UE edema noted, bilateral mild LE edema. RUE with ecchymosis noted, PICC in place. Skin: Warm/dry, ecchymosis as above. Multiple small scratches/scabs noted to BLE.  Resolved Hospital Problem List   N/A  Assessment & Plan:   Acute combined hypoxic and hypercapnic respiratory failure Multifactorial secondary to CHF, pulmonary hypertension, restrictive lung disease secondary to kyphosis [no evidence of COPD], likely has a component of OSA/obesity hypoventilation Requires 2L supplemental O2 via [place] at baseline at home. - Continue full vent support (4-8 cc/kg IBW) - Wean FiO2 for O2  sat > 90% - Daily WUA/SBT - VAP bundle - Pulmonary hygiene - PAD protocol for sedation: Propofol and Fentanyl for goal RASS 0 to -1 - Follow intermittent CXR, ABG - Continue Lasix  BID - Plan for tracheostomy tomorrow, 7/7  Septic shock present on admission secondary to pneumonia CT reviewed with new infiltrate suggestive of pneumonia. Hypotension overnight 7/5. A-line placed. LR bolus given. CXR demonstrating bibasilar infiltrate/atelectasis. MRSA nasal swab negative, vanc discontinued. - Continue cefepime, azithromycin - F/u finalized BCx and trach aspirate - Narrow antibiotics as able - Goal MAP > 65 - Continue Levophed, titrate to MAP goal  Acute on chronic anemia, likely multifactorial CT A/P completed 7/6 to r/o RP bleed (negative), no signs of overt bleeding on CT or on physical examination. - Trend H&H - Transfuse for Hgb < 7.0 - 1U PRBCs to be given today, 7/6 in the setting of Hgb drift  (6.7 from 7.4)  Acute encephalopathy, multifactorial with metabolic component secondary to hypercarbia, sepsis History of stroke, seizure CT head negative for acute processes. - Continue Keppra - Continue gabapentin - Frequent neuro exams - PAD protocol as above, goal RASS 0 to -1  AFib on Eliquis Elevated troponin secondary to demand Pulm HTN-RVSP in 70's Very large PA, RVSP on Echo 5/22. Remains in Afib, rate controlled. Repeat Echo 7/4 demonstrating LVEF 45-50%, global hypokinesis, moderate concentric LVH; RVSF moderately reduced. Estimated RVSP . - Continue to hold Eliquis with impending tracheostomy 7/7 - Continue heparin gtt for now - Monitor aPTT/heparin levels - Continue amiodarone  Acute kidney injury superimposed on CKD stage III Creat 1.0 on admission, uptrending. Suspect multifactorial with most recent bump secondary to hypotension overnight 7/5. - Trend BMP - Replete electrolytes as indicated - Monitor I&Os - Avoid nephrotoxic agents as able - Ensure adequate renal perfusion  Acute electrolyte abnormalities Hypokalemia Hypophosphatemia - Trend BMP, Phos, Mg - Replete as indicated for goal K > 4, Mg > 2  T2DM CBGs remain  elevated, TF coverage added 7/6. - Increase Lantus to home dose 20U - Add TF coverage - Continue SSI - CBGs Q4H  Mood disorder, dementia Chronic pain - Continue gabapentin - Hold Cymbalta for now  Hypothyroidism - Continue home levothyroxine  Goals of care - Tracheostomy planned for 7/7 - See excerpt from Dr. Shirlee More note below - Family's wishes remain the same at this time (7/6)  Per Dr. Isaiah Serge 7/4: "This is her fourth admission this year. Per notes she is  mostly housebound on 2lpm 24/7 with baseline dementia May be headed towards a tracheostomy Doubt if she will be able to tolerate noninvasive ventilation at home due to dementia.  Daughter does not want to place her in a facility but is still considering a trach since she was trached 3 years ago and was decannulated. We discussed today that this time may be different and she may not be able to come off support. Will need to readdress goals of care.  Palliative care consult placed  Best Practice (right click and "Reselect all SmartList Selections" daily)   Diet/type: tubefeeds DVT prophylaxis: systemic heparin GI prophylaxis: PPI Lines: yes and it is still needed Foley:  Yes, and it is still needed Code Status:  full code Last date of multidisciplinary goals of care discussion [7/4]  Critical care time: 41 minutes   Tim Lair, PA-C [facility] 01/06/21 8:38 AM  Please see [email or web address] for pager details.  From 7A-7P if no response, please call  [phone] After hours, please call ELink [phone]

## 2021-01-06 NOTE — Progress Notes (Signed)
Transported patient to CT and back to 2M14 without event.

## 2021-01-06 NOTE — Progress Notes (Signed)
ANTICOAGULATION CONSULT NOTE  Pharmacy Consult for IV Heparin Indication: atrial fibrillation  Patient Measurements: Height: 5\' 8"  (172.7 cm) Weight: 106.4 kg (234 lb 9.1 oz) IBW/kg (Calculated) : 63.9 Heparin Dosing Weight: 87.8 kg   Vital Signs: Temp: 98.9 F (37.2 C) (07/05 2338) Temp Source: Oral (07/05 2338) BP: 108/45 (07/05 1615) Pulse Rate: 75 (07/05 1845)  Labs: Recent Labs    01/03/21 1255 01/03/21 1527 01/03/21 1728 01/03/21 2326 01/04/21 0358 01/04/21 1216 01/04/21 2356 01/05/21 0438 01/05/21 0439 01/05/21 1410 01/05/21 1948 01/06/21 0018  HGB 9.1*  --   --    < > 7.5*  --  7.4* 7.4*  --   --   --   --   HCT 31.6*  --   --    < > 24.5*  --  22.7* 23.7*  --   --   --   --   PLT 243  --   --   --  195  --  214 208  --   --   --   --   APTT  --   --   --   --  71*   < > 184* 117*  --  135*  --  100*  LABPROT  --   --   --   --  23.5*  --   --   --   --   --   --   --   INR  --   --   --   --  2.1*  --   --   --   --   --   --   --   HEPARINUNFRC  --   --   --   --  >1.10*  --   --   --  >1.10*  --   --   --   CREATININE 1.00  --   --   --   --   --   --  1.31*  --   --  1.17*  --   TROPONINIHS  --  108* 102*  --   --   --   --   --   --   --   --   --    < > = values in this interval not displayed.    Estimated Creatinine Clearance: 56.3 mL/min (A) (by C-G formula based on SCr of 1.17 mg/dL (H)).  Assessment: 70 yr old female with hx atrial fibrillation, for which was on apixaban PTA (last dose at 01/03/21 at 0800). Pharmacy was consulted to dose IV heparin while apixaban is on hold. Given recent apixaban exposure, will monitor anticoagulation using aPTT until heparin level and aPTT correlate.  aPTT ~6 hrs after heparin infusion was held X 90 minutes, then restarted at 1100 units/hr, was 135 sec, which is above the goal range for this pt. H/H 7.4/23.7, plt 208 (CBC stable). Per RN, no issues with IV or bleeding observed.  7/6 AM update:  aPTT therapeutic  after holding/rate decrease  Goal of Therapy:  Heparin level 0.3-0.7 units/ml aPTT 66-102 seconds Monitor platelets by anticoagulation protocol: Yes   Plan:  Cont heparin 950 units/hr Confirmatory aPTT/HL with AM labs Monitor daily aPTT, heparin level, CBC Monitor for bleeding  9/6, PharmD, BCPS Clinical Pharmacist Phone: [phone]

## 2021-01-07 ENCOUNTER — Inpatient Hospital Stay (HOSPITAL_COMMUNITY): Payer: PPO

## 2021-01-07 DIAGNOSIS — J9601 Acute respiratory failure with hypoxia: Secondary | ICD-10-CM | POA: Diagnosis not present

## 2021-01-07 DIAGNOSIS — G8929 Other chronic pain: Secondary | ICD-10-CM

## 2021-01-07 DIAGNOSIS — Z7901 Long term (current) use of anticoagulants: Secondary | ICD-10-CM

## 2021-01-07 DIAGNOSIS — G40909 Epilepsy, unspecified, not intractable, without status epilepticus: Secondary | ICD-10-CM

## 2021-01-07 DIAGNOSIS — E039 Hypothyroidism, unspecified: Secondary | ICD-10-CM

## 2021-01-07 DIAGNOSIS — R410 Disorientation, unspecified: Secondary | ICD-10-CM | POA: Diagnosis not present

## 2021-01-07 DIAGNOSIS — I4811 Longstanding persistent atrial fibrillation: Secondary | ICD-10-CM

## 2021-01-07 DIAGNOSIS — G9340 Encephalopathy, unspecified: Secondary | ICD-10-CM

## 2021-01-07 DIAGNOSIS — Z794 Long term (current) use of insulin: Secondary | ICD-10-CM

## 2021-01-07 DIAGNOSIS — Z95828 Presence of other vascular implants and grafts: Secondary | ICD-10-CM | POA: Diagnosis not present

## 2021-01-07 DIAGNOSIS — E119 Type 2 diabetes mellitus without complications: Secondary | ICD-10-CM

## 2021-01-07 DIAGNOSIS — J9621 Acute and chronic respiratory failure with hypoxia: Secondary | ICD-10-CM | POA: Diagnosis not present

## 2021-01-07 LAB — TYPE AND SCREEN
ABO/RH(D): A POS
Antibody Screen: NEGATIVE
Unit division: 0

## 2021-01-07 LAB — BASIC METABOLIC PANEL
Anion gap: 9 (ref 5–15)
BUN: 23 mg/dL (ref 8–23)
CO2: 23 mmol/L (ref 22–32)
Calcium: 7.7 mg/dL — ABNORMAL LOW (ref 8.9–10.3)
Chloride: 106 mmol/L (ref 98–111)
Creatinine, Ser: 1.29 mg/dL — ABNORMAL HIGH (ref 0.44–1.00)
GFR, Estimated: 44 mL/min — ABNORMAL LOW (ref >60)
Glucose, Bld: 181 mg/dL — ABNORMAL HIGH (ref 70–99)
Potassium: 3.1 mmol/L — ABNORMAL LOW (ref 3.5–5.1)
Sodium: 138 mmol/L (ref 135–145)

## 2021-01-07 LAB — GLUCOSE, CAPILLARY
Glucose-Capillary: 120 mg/dL — ABNORMAL HIGH (ref 70–99)
Glucose-Capillary: 128 mg/dL — ABNORMAL HIGH (ref 70–99)
Glucose-Capillary: 136 mg/dL — ABNORMAL HIGH (ref 70–99)
Glucose-Capillary: 145 mg/dL — ABNORMAL HIGH (ref 70–99)
Glucose-Capillary: 149 mg/dL — ABNORMAL HIGH (ref 70–99)
Glucose-Capillary: 200 mg/dL — ABNORMAL HIGH (ref 70–99)

## 2021-01-07 LAB — CBC
HCT: 22.5 % — ABNORMAL LOW (ref 36.0–46.0)
Hemoglobin: 7.3 g/dL — ABNORMAL LOW (ref 12.0–15.0)
MCH: 28.2 pg (ref 26.0–34.0)
MCHC: 32.4 g/dL (ref 30.0–36.0)
MCV: 86.9 fL (ref 80.0–100.0)
Platelets: 140 10*3/uL — ABNORMAL LOW (ref 150–400)
RBC: 2.59 MIL/uL — ABNORMAL LOW (ref 3.87–5.11)
RDW: 17.5 % — ABNORMAL HIGH (ref 11.5–15.5)
WBC: 10 10*3/uL (ref 4.0–10.5)
nRBC: 0.3 % — ABNORMAL HIGH (ref 0.0–0.2)

## 2021-01-07 LAB — APTT: aPTT: 99 seconds — ABNORMAL HIGH (ref 24–36)

## 2021-01-07 LAB — CULTURE, RESPIRATORY W GRAM STAIN: Culture: NORMAL

## 2021-01-07 LAB — BPAM RBC
Blood Product Expiration Date: 202207222359
ISSUE DATE / TIME: 202207061130
Unit Type and Rh: 6200

## 2021-01-07 LAB — MAGNESIUM: Magnesium: 2.1 mg/dL (ref 1.7–2.4)

## 2021-01-07 LAB — HEMOGLOBIN AND HEMATOCRIT, BLOOD
HCT: 24.2 % — ABNORMAL LOW (ref 36.0–46.0)
Hemoglobin: 7.8 g/dL — ABNORMAL LOW (ref 12.0–15.0)

## 2021-01-07 LAB — PHOSPHORUS: Phosphorus: 3.9 mg/dL (ref 2.5–4.6)

## 2021-01-07 LAB — HEPARIN LEVEL (UNFRACTIONATED): Heparin Unfractionated: 0.77 IU/mL — ABNORMAL HIGH (ref 0.30–0.70)

## 2021-01-07 MED ORDER — VITAL HIGH PROTEIN PO LIQD
1000.0000 mL | ORAL | Status: DC
  Administered 2021-01-08 – 2021-01-09 (×2): 1000 mL

## 2021-01-07 MED ORDER — POTASSIUM CHLORIDE 10 MEQ/50ML IV SOLN: 10.0000 meq | INTRAVENOUS | Status: DC

## 2021-01-07 MED ORDER — LEVETIRACETAM IN NACL 1000 MG/100ML IV SOLN
1000.0000 mg | Freq: Once | INTRAVENOUS | Status: AC
  Administered 2021-01-08: 1000 mg via INTRAVENOUS
  Filled 2021-01-07: qty 100

## 2021-01-07 MED ORDER — HEPARIN (PORCINE) 25000 UT/250ML-% IV SOLN
950.0000 [IU]/h | INTRAVENOUS | Status: DC
  Administered 2021-01-08 – 2021-01-10 (×3): 950 [IU]/h via INTRAVENOUS
  Filled 2021-01-07 (×3): qty 250

## 2021-01-07 MED ORDER — PROSOURCE TF PO LIQD
45.0000 mL | Freq: Two times a day (BID) | ORAL | Status: DC
  Administered 2021-01-08 – 2021-01-12 (×8): 45 mL
  Filled 2021-01-07 (×7): qty 45

## 2021-01-07 MED ORDER — POTASSIUM CHLORIDE 20 MEQ PO PACK
40.0000 meq | PACK | ORAL | Status: AC
  Administered 2021-01-07: 40 meq
  Filled 2021-01-07: qty 2

## 2021-01-07 MED ORDER — POTASSIUM CHLORIDE 10 MEQ/100ML IV SOLN
10.0000 meq | INTRAVENOUS | Status: AC
  Administered 2021-01-07 (×2): 10 meq via INTRAVENOUS
  Filled 2021-01-07 (×2): qty 100

## 2021-01-07 NOTE — Progress Notes (Addendum)
ANTICOAGULATION CONSULT NOTE  Pharmacy Consult for IV Heparin Indication: atrial fibrillation  Patient Measurements: Height: 5\' 8"  (172.7 cm) Weight: 108.1 kg (238 lb 5.1 oz) IBW/kg (Calculated) : 63.9 Heparin Dosing Weight: 87.8 kg   Vital Signs: Temp: 97.7 F (36.5 C) (07/07 0700) Temp Source: Oral (07/07 0700) BP: 98/58 (07/07 0800) Pulse Rate: 64 (07/07 0800)  Labs: Recent Labs    01/05/21 0438 01/05/21 0439 01/05/21 1948 01/06/21 0018 01/06/21 0443 01/06/21 1627 01/06/21 2347 01/07/21 0500 01/07/21 0531  HGB 7.4*  --   --   --  6.7* 7.6* 7.8*  --  7.3*  HCT 23.7*  --   --   --  20.7* 23.4* 24.2*  --  22.5*  PLT 208  --   --   --  158  --   --   --  140*  APTT 117*   < >  --  100* 95*  --   --   --  99*  HEPARINUNFRC  --    < >  --  >1.10* >1.10*  --   --  0.77*  --   CREATININE 1.31*  --  1.17*  --  1.19*  --   --   --  1.29*   < > = values in this interval not displayed.   Estimated Creatinine Clearance: 51.5 mL/min (A) (by C-G formula based on SCr of 1.29 mg/dL (H)).  Assessment: 70 yr old female with hx atrial fibrillation, for which was on apixaban PTA (last dose at 01/03/21 at 0800). Pharmacy was consulted to dose IV heparin while apixaban is on hold. Given recent apixaban exposure, will monitor anticoagulation using aPTT until heparin level and aPTT correlate.  aPTT remains therapeutic. HL still falsely high but starting to trend down. Hgb 7.3 after receiving unit of blood on 7/6 for low of 6.7 and platelets are down to 140- will monitor closely. Imaging neg for RP bleed. Some oozing noted from skin wound but no other bleeding. CCM okay with continuing.   *Trach this AM - Heparin held.   Goal of Therapy:  Heparin level 0.3-0.7 units/ml aPTT 66-102 seconds Monitor platelets by anticoagulation protocol: Yes   Plan:  - Will follow-up post trach for plan to restart IV Heparin - Plan to restart IV Heparin at 950 units/hr as has been therapeutic and stable at  this rate - Continue daily aPTT/HL until correlated - Will continue to monitor for any signs/symptoms of bleeding and will follow up with aPTT level in the a.m.  - Monitor platelets closely as trending down.   Thank you for allowing pharmacy to be a part of this patient's care.  9/6, PharmD, BCPS, BCCCP Clinical Pharmacist Please refer to [facility] for [facility] numbers 01/07/2021 10:13 AM

## 2021-01-07 NOTE — Progress Notes (Signed)
NAME:  [name], MRN:  [id number], DOB:  [birth year], LOS: 4 ADMISSION DATE:  01/03/2021, CONSULTATION DATE:  01/03/2021 REFERRING MD:  EDP - [facility] CHIEF COMPLAINT:  SOB, encephalopathy   History of Present Illness:  70 year old female with PMHx significant for obesity, hypothyroidism, T2DM, chronic Afib (on Eliquis), CAD with prior NSTEMI, diastolic HF (last Echo 7/4 EF 20-25%), pulmonary HTN, chronic hypoxemic respiratory failure (home baseline O2 requirement 2L [place]), seizure disorder, CKD3, dementia who presented to [facility] 7/3 for AMS, increased WOB and SOB. Transferred to [facility]. O2 sat noted as low as 65% per EMS. Placed on NRB with sat improving to 100%, though PCO2 noted to be 72. Patient placed on BiPAP, but uncooperative/unable to tolerate and subsequently intubated.  Of note, patient recently admitted 6/20 - 6/29 for acute-on-chronic hypoxic/hypercapnic respiratory failure 2/2 CHF exacerbation.   She remains critically ill in the 53M ICU. Pertinent  Medical History  Obesity Type 2 diabetes,  Chronic atrial fibrillation on Eliquis Dementia Diastolic CHF with chronic hypoxemia on 2 L nasal cannula Hypothyroidism Seizure disorder CKD stage III CAD with prior NSTEMI,  Dyslipidemia,  GERD NSTEMI 08/2020  Significant Hospital Events: Including procedures, antibiotic start and stop dates in addition to other pertinent events   Admitted to ICU on 01/03/21, transfer to [facility]. 7/3 CT LLL PNU.  7/4-7/5 Hypotension overnight. A line placed. LR bolus. Started on NE 7/6 Hgb 6.7 (7.4), CT A/P completed to r/o RP bleed (negative), showed mild peripancreatic stranding. Lipase slightly elevated. 1U PRBCs ordered. 7/7 Tracheostomy scheduled for today, heparin gtt held  Interim History / Subjective:  No significant events overnight Remains unresponsive on exam (on Propofol) Hgb fairly stable post-transfusion, bumped appropriately Heparin gtt held this AM Bedside trach today at 1000  Objective:   Blood pressure (!) 106/52, pulse (!) 59, temperature 97.6 F (36.4 C), temperature source Oral, resp. rate 20, height 5\' 8"  (1.727 m), weight 108.1 kg, SpO2 100 %.    Vent Mode: PRVC FiO2 (%):  [30 %-40 %] 30 % Set Rate:  [20 bmp] 20 bmp Vt Set:  [510 mL] 510 mL PEEP:  [5 cmH20] 5 cmH20 Plateau Pressure:  [20 cmH20-22 cmH20] 21 cmH20   Intake/Output Summary (Last 24 hours) at 01/07/2021 0820 Last data filed at 01/07/2021 0600 Gross per 24 hour  Intake 3253.6 ml  Output 2925 ml  Net 328.6 ml    Filed Weights   01/05/21 0605 01/06/21 0408 01/07/21 0500  Weight: 106.4 kg 107.4 kg 108.1 kg   Physical Examination: General: Chronically ill-appearing elderly woman in NAD. HEENT: [place]/AT, anicteric sclera, pupils equal 62mm and pinpoint, slightly dry mucous membranes. ETT in place. Neuro: Sedated. Responds to noxious stimuli. Not following commands. +Cough and +Gag  CV: RRR, no m/g/r. PULM: Breathing even and unlabored on vent (PEEP 5, FiO2 40%). Lung fields diminished throughout. GI: Soft, nontender, nondistended. Normoactive bowel sounds. Extremities: Bilateral symmetric 1-2+ UE edema noted, bilateral mild LE edema, RUE with ecchymosis noted in upper arm, PICC in place. L radial A-line with scant surrounding bleeding due to position. Skin: Warm/dry, ecchymosis as above. Multiple small scratches/scabs noted to BLE.  Resolved Hospital Problem List   N/A  Assessment & Plan:   Acute combined hypoxic and hypercapnic respiratory failure Multifactorial secondary to CHF, pulmonary hypertension, restrictive lung disease secondary to kyphosis [no evidence of COPD], likely has a component of OSA/obesity hypoventilation Requires 2L supplemental O2 via [place] at baseline at home - Continue full vent support (4-8cc/kg IBW) -  Wean FiO2 for O2 sat > 90% - Daily WUA/SBT - VAP bundle - Pulmonary hygiene - PAD protocol for sedation: Propofol and Fentanyl for goal RASS 0 to -1 - Follow intermittent CXR -  Continue Lasix BID - Tracheostomy today 7/7, heparin gtt held  Septic shock present on admission secondary to pneumonia CT reviewed with new infiltrate suggestive of pneumonia. Hypotension overnight 7/5. A-line placed. LR bolus given. CXR demonstrating bibasilar infiltrate/atelectasis. MRSA nasal swab negative, vanc discontinued. - Continue cefepime, azithromycin - F/u finalized BCx and TA - Narrow abx as able - Goal MAP > 65, continue to titrate Levophed to goal  Acute on chronic anemia, likely multifactorial CT A/P completed 7/6 to r/o RP bleed (negative), no signs of overt bleeding on CT or on physical examination. - Trend H&H - Transfuse for Hgb < 7.0 - S/p 1U PRBCs 7/6 with good response  Acute encephalopathy, multifactorial with metabolic component secondary to hypercarbia, sepsis History of stroke, seizure CT head negative for acute processes. - Continue Keppra, gabapentin - Frequent neuro exams - PAD protocol as above, RASS 0 to -1  AFib on Eliquis Elevated troponin secondary to demand Pulm HTN-RVSP in 70's Very large PA, RVSP on Echo 5/22. Remains in Afib, rate controlled. Repeat Echo 7/4 demonstrating LVEF 45-50%, global hypokinesis, moderate concentric LVH; RVSF moderately reduced. Estimated RVSP . - Hold heparin gtt 7/7 AM in anticipation of tracheostomy - Resume heparin post-procedure - Eventual transition back to home Eliquis once no longer requiring procedures - Monitor aPTT/heparin levels - Continue amiodarone  Acute kidney injury superimposed on CKD stage III Creat 1.0 on admission, uptrending. Suspect multifactorial with most recent bump secondary to hypotension overnight 7/5. - Trend BMP - Replete electrolytes as indicated - Monitor I&Os - Avoid nephrotoxic agents as able - Ensure adequate renal perfusion  Acute electrolyte abnormalities Hypokalemia Hypophosphatemia - Trend BMP, phos, Mg - Replete as indicated for goal K > 4, Mg >  2 - K repleted today, 7/7  T2DM CBGs remain elevated, TF coverage added 7/6. - Continue Lantus 20U (home dose, increased 7/7) - Continue TF coverage - SSI  Mood disorder, dementia Chronic pain - Continue gabapentin - Hold cymbalta at present  Hypothyroidism - Continue levothyroxine  Goals of care - Trach today, 7/7 - See Dr. Shirlee More comments below - Family wishes to continue aggressive measures at this time  Per Dr. Isaiah Serge 7/4: "This is her fourth admission this year. Per notes she is  mostly housebound on 2lpm 24/7 with baseline dementia May be headed towards a tracheostomy Doubt if she will be able to tolerate noninvasive ventilation at home due to dementia.  Daughter does not want to place her in a facility but is still considering a trach since she was trached 3 years ago and was decannulated. We discussed today that this time may be different and she may not be able to come off support. Will need to readdress goals of care.  Palliative care consult placed  Best Practice (right click and "Reselect all SmartList Selections" daily)   Diet/type: tubefeeds DVT prophylaxis: systemic heparin - HELD for trach today, 7/7 GI prophylaxis: PPI Lines: yes and it is still needed Foley:  Yes, and it is still needed Code Status:  full code Last date of multidisciplinary goals of care discussion [7/4]  Critical care time: 35 minutes   Tim Lair, PA-C [facility] 01/07/21 8:20 AM  Please see [email or web address] for pager details.  From 7A-7P if  no response, please call [phone] After hours, please call ELink [phone]

## 2021-01-07 NOTE — TOC Progression Note (Signed)
Transition of Care [facility]) - Progression Note    Patient Details  Name: [name] MRN: [id number] Date of Birth: 03/31/1951  [facility] of [facility]) CM/SW Contact  Huston Foley Jacklynn Ganong, RN Phone Number: 01/07/2021, 12:57 PM  Clinical Narrative: Case Manager has requested Victorino Dike with [facility] review patient for possible bed. TOC will continue to follow..           Expected Discharge Plan and Services                                                 Social Determinants of Health (SDOH) Interventions    Readmission Risk Interventions Readmission Risk Prevention Plan 12/28/2020 11/24/2020 09/21/2020  Transportation Screening Complete Complete Complete  HRI or Home Care Consult - Complete -  Social Work Consult for Recovery Care Planning/Counseling - Complete -  Palliative Care Screening - Not Applicable -  Medication Review Oceanographer) Complete Complete Complete  PCP or Specialist appointment within 3-5 days of discharge - - Complete  HRI or Home Care Consult Complete - Complete  SW Recovery Care/Counseling Consult Complete - Complete  Palliative Care Screening (No Data) - Complete  Skilled Nursing Facility Patient Refused - Complete  Some recent data might be hidden

## 2021-01-07 NOTE — Procedures (Signed)
Diagnostic Bronchoscopy  [name]  [id number]  1951-04-22  Date:01/07/21  Time:10:45 AM   Provider Performing:Hafsah Hendler D Staci Righter NP  Procedure: Diagnostic Bronchoscopy [phone])  Indication(s) Assist with direct visualization of tracheostomy placement  Consent Risks of the procedure as well as the alternatives and risks of each were explained to the patient and/or caregiver.  Consent for the procedure was obtained.   Anesthesia See separate tracheostomy note   Time Out Verified patient identification, verified procedure, site/side was marked, verified correct patient position, special equipment/implants available, medications/allergies/relevant history reviewed, required imaging and test results available.   Sterile Technique Usual hand hygiene, masks, gowns, and gloves were used   Procedure Description Bronchoscope advanced through endotracheal tube and into airway.  After suctioning out tracheal secretions, bronchoscope used to provide direct visualization of tracheostomy placement.   Complications/Tolerance None; patient tolerated the procedure well.   EBL None  Specimen(s) None  JD Anselm Lis [facility] 01/07/2021, 10:46 AM  Please see [email or web address] for pager details.  From 7A-7P if no response, please call [phone]. After hours, please call ELink [phone].

## 2021-01-07 NOTE — Progress Notes (Signed)
Nutrition Follow-up  DOCUMENTATION CODES:   Obesity unspecified  INTERVENTION:   When Cortrak is placed, resume TF: Vital High Protein at 50 ml/h (1200 ml/d) ProSource TF 45 ml BID  Provides 1280 kcal (1784 kcal total with propofol), 127 gm protein, 1003 ml free water daily.  NUTRITION DIAGNOSIS:   Inadequate oral intake related to inability to eat as evidenced by NPO status.  Ongoing   GOAL:   Provide needs based on ASPEN/SCCM guidelines  Currently unmet, TF is off  MONITOR:   Weight trends, TF tolerance, Labs, Vent status, Skin, I & O's  REASON FOR ASSESSMENT:   Consult Assessment of nutrition requirement/status, Enteral/tube feeding initiation and management  ASSESSMENT:   Pt with a PMH including T2DM, Afib, dementia, CHF, seizure disorder, CKD stage 3, CAD w/ prior NSTEMI, HTN, hypothyrodism, and dyslipidemia admitted with acute combined hypoxic and hypercapnic respiratory failure 2/2 CHF, HTN, restrictive lung disease 2/2 kyphosis. Note pt recently admitted from 6/20-6/29 for acute on chronic hypoxic hypercapnic respiratory failure 2/2 CHF exacerbations.  Discussed patient in ICU rounds and with RN today. S/P tracheostomy this morning.  OG tube was removed during tracheostomy, TF is off. Plans for Cortrak tube placement tomorrow.  Patient is being evaluated for possible LTACH placement.  Patient is currently intubated on ventilator support via trach MV: 9.4 L/min Temp (24hrs), Avg:97.6 F (36.4 C), Min:96.3 F (35.7 C), Max:98.6 F (37 C)  Propofol: 19.1 ml/hr providing 504 kcal from lipid  Labs reviewed. K 3.1 CBG: 136-149  Medications reviewed and include lasix, novolog, lantus, protonix, klor-con, levophed, propofol. IVF: D10 at 40 ml/h while TF is off  Admission weight 106.1 kg Current weight 108.1 kg I/O +1.6 L since admission  Intake/Output Summary (Last 24 hours) at 01/07/2021 1506 Last data filed at 01/07/2021 1200 Gross per 24 hour  Intake  3279.41 ml  Output 1025 ml  Net 2254.41 ml    Diet Order:   Diet Order             Diet NPO time specified  Diet effective midnight                   EDUCATION NEEDS:   Not appropriate for education at this time  Skin:  Skin Assessment: Reviewed RN Assessment (skin tear R arm noted from recent admission)  Last BM:  7/5 type 6  Height:   Ht Readings from Last 1 Encounters:  01/03/21 5\' 8"  (1.727 m)    Weight:   Wt Readings from Last 1 Encounters:  01/07/21 108.1 kg    Ideal Body Weight:  63.63 kg  BMI:  Body mass index is 36.24 kg/m.  Estimated Nutritional Needs:   Kcal:  03/10/21  Protein:  110-130 gm  Fluid:  >/= 1.8 L    [id number], RD, LDN, CNSC Please refer to Amion for contact information.

## 2021-01-07 NOTE — Procedures (Signed)
Percutaneous Tracheostomy Procedure Note   [name]  [id number]  03/14/1951  Date:01/07/21  Time:10:51 AM   Provider Performing:Briseida Gittings C Katrinka Blazing  Procedure: Percutaneous Tracheostomy with Bronchoscopic Guidance (76720)  Indication(s) Persistent respiratory failure  Consent Risks of the procedure as well as the alternatives and risks of each were explained to the patient and/or caregiver.  Consent for the procedure was obtained.  Anesthesia Etomidate, Versed, Fentanyl, Vecuronium   Time Out Verified patient identification, verified procedure, site/side was marked, verified correct patient position, special equipment/implants available, medications/allergies/relevant history reviewed, required imaging and test results available.   Sterile Technique Maximal sterile technique including sterile barrier drape, hand hygiene, sterile gown, sterile gloves, mask, hair covering.    Procedure Description Appropriate anatomy identified by palpation.  Patient's neck prepped and draped in sterile fashion.  1% lidocaine with epinephrine was used to anesthetize skin overlying neck.  1.5cm incision made and blunt dissection performed until tracheal rings could be easily palpated.   Then a size 6-0 Shiley tracheostomy was placed under bronchoscopic visualization using usual Seldinger technique and serial dilation.   Bronchoscope confirmed placement above the carina.  Tracheostomy was sutured in place with adhesive pad to protect skin under pressure.    Patient connected to ventilator.   Complications/Tolerance None; patient tolerated the procedure well. Chest X-ray is ordered to confirm no post-procedural complication.  Please note she is fairly deep so if run into issues would use a prox XLT   EBL Minimal   Specimen(s) None

## 2021-01-07 NOTE — Progress Notes (Signed)
eLink Physician-Brief Progress Note Patient Name: [name] DOB: [birth year] MRN: [id number]   Date of Service  01/07/2021  HPI/Events of Note  RN requests IV Keppra dose this evening since patient has no enteral access until morning when they will have a Cortrak placed.   eICU Interventions  Ordered 1x dose of Keppra 1000mg  IV.     Intervention Category Minor Interventions: Routine modifications to care plan (e.g. PRN medications for pain, fever)  [name] 01/07/2021, 10:10 PM

## 2021-01-08 ENCOUNTER — Inpatient Hospital Stay (HOSPITAL_COMMUNITY): Payer: PPO

## 2021-01-08 DIAGNOSIS — J9622 Acute and chronic respiratory failure with hypercapnia: Secondary | ICD-10-CM | POA: Diagnosis not present

## 2021-01-08 DIAGNOSIS — J9621 Acute and chronic respiratory failure with hypoxia: Secondary | ICD-10-CM | POA: Diagnosis not present

## 2021-01-08 LAB — BASIC METABOLIC PANEL
Anion gap: 6 (ref 5–15)
BUN: 26 mg/dL — ABNORMAL HIGH (ref 8–23)
CO2: 24 mmol/L (ref 22–32)
Calcium: 8.1 mg/dL — ABNORMAL LOW (ref 8.9–10.3)
Chloride: 106 mmol/L (ref 98–111)
Creatinine, Ser: 1.54 mg/dL — ABNORMAL HIGH (ref 0.44–1.00)
GFR, Estimated: 36 mL/min — ABNORMAL LOW (ref >60)
Glucose, Bld: 193 mg/dL — ABNORMAL HIGH (ref 70–99)
Potassium: 3.8 mmol/L (ref 3.5–5.1)
Sodium: 136 mmol/L (ref 135–145)

## 2021-01-08 LAB — CBC
HCT: 26.2 % — ABNORMAL LOW (ref 36.0–46.0)
Hemoglobin: 8.4 g/dL — ABNORMAL LOW (ref 12.0–15.0)
MCH: 28.4 pg (ref 26.0–34.0)
MCHC: 32.1 g/dL (ref 30.0–36.0)
MCV: 88.5 fL (ref 80.0–100.0)
Platelets: 183 10*3/uL (ref 150–400)
RBC: 2.96 MIL/uL — ABNORMAL LOW (ref 3.87–5.11)
RDW: 18.2 % — ABNORMAL HIGH (ref 11.5–15.5)
WBC: 14.2 10*3/uL — ABNORMAL HIGH (ref 4.0–10.5)
nRBC: 0.3 % — ABNORMAL HIGH (ref 0.0–0.2)

## 2021-01-08 LAB — MAGNESIUM: Magnesium: 2 mg/dL (ref 1.7–2.4)

## 2021-01-08 LAB — GLUCOSE, CAPILLARY
Glucose-Capillary: 161 mg/dL — ABNORMAL HIGH (ref 70–99)
Glucose-Capillary: 165 mg/dL — ABNORMAL HIGH (ref 70–99)
Glucose-Capillary: 184 mg/dL — ABNORMAL HIGH (ref 70–99)
Glucose-Capillary: 203 mg/dL — ABNORMAL HIGH (ref 70–99)
Glucose-Capillary: 206 mg/dL — ABNORMAL HIGH (ref 70–99)
Glucose-Capillary: 215 mg/dL — ABNORMAL HIGH (ref 70–99)

## 2021-01-08 LAB — HEPARIN LEVEL (UNFRACTIONATED): Heparin Unfractionated: 0.51 IU/mL (ref 0.30–0.70)

## 2021-01-08 LAB — CORTISOL: Cortisol, Plasma: 18.9 ug/dL

## 2021-01-08 LAB — APTT: aPTT: 86 seconds — ABNORMAL HIGH (ref 24–36)

## 2021-01-08 MED ORDER — PANTOPRAZOLE SODIUM 40 MG PO PACK
40.0000 mg | PACK | Freq: Every day | ORAL | Status: DC
  Administered 2021-01-08 – 2021-02-15 (×37): 40 mg
  Filled 2021-01-08 (×39): qty 20

## 2021-01-08 MED ORDER — ACETAMINOPHEN 325 MG PO TABS
650.0000 mg | ORAL_TABLET | Freq: Four times a day (QID) | ORAL | Status: DC | PRN
  Administered 2021-01-08 – 2021-02-15 (×67): 650 mg
  Filled 2021-01-08 (×68): qty 2

## 2021-01-08 MED ORDER — FUROSEMIDE 10 MG/ML IJ SOLN
20.0000 mg | Freq: Every day | INTRAMUSCULAR | Status: DC
  Administered 2021-01-09: 20 mg via INTRAVENOUS
  Filled 2021-01-08: qty 2

## 2021-01-08 MED ORDER — MIDODRINE HCL 5 MG PO TABS
5.0000 mg | ORAL_TABLET | Freq: Three times a day (TID) | ORAL | Status: DC
  Administered 2021-01-08 – 2021-01-13 (×13): 5 mg
  Filled 2021-01-08 (×13): qty 1

## 2021-01-08 NOTE — Progress Notes (Signed)
ANTICOAGULATION CONSULT NOTE  Pharmacy Consult for IV Heparin Indication: atrial fibrillation  Patient Measurements: Height: 5\' 8"  (172.7 cm) Weight: 107.9 kg (237 lb 14 oz) IBW/kg (Calculated) : 63.9 Heparin Dosing Weight: 87.8 kg   Vital Signs: Temp: 100 F (37.8 C) (07/08 0700) Temp Source: Axillary (07/08 0700) BP: 103/55 (07/08 0930) Pulse Rate: 96 (07/08 0930)  Labs: Recent Labs    01/05/21 1948 01/06/21 0018 01/06/21 0443 01/06/21 1627 01/06/21 2347 01/07/21 0500 01/07/21 0531 01/08/21 0500  HGB  --   --  6.7*   < > 7.8*  --  7.3* 8.4*  HCT  --   --  20.7*   < > 24.2*  --  22.5* 26.2*  PLT  --   --  158  --   --   --  140* 183  APTT  --    < > 95*  --   --   --  99* 86*  HEPARINUNFRC  --    < > >1.10*  --   --  0.77*  --  0.51  CREATININE 1.17*  --  1.19*  --   --   --  1.29*  --    < > = values in this interval not displayed.    Estimated Creatinine Clearance: 51.5 mL/min (A) (by C-G formula based on SCr of 1.29 mg/dL (H)).  Assessment: 70 yr old female with hx atrial fibrillation, for which was on apixaban PTA (last dose at 01/03/21 at 0800). Pharmacy was consulted to dose IV heparin while apixaban is on hold. Given recent apixaban exposure, will monitor anticoagulation using aPTT until heparin level and aPTT correlate.  aPTT remains therapeutic at 86 and heparin level now therapeutic at 0.51. Hgb up 8.4 and plt up 183 after receiving unit of blood on 7/6 for low of 6.7. Imaging neg for RP bleed. Some oozing noted from skin wound but no other bleeding, no s/sx of bleeding per nurse. Heparin held 7/07 for 6h for trach placement.  Goal of Therapy:  Heparin level 0.3-0.7 units/ml aPTT 66-102 seconds Monitor platelets by anticoagulation protocol: Yes   Plan:  - Will follow-up post Cortrak placement for plan to restart apixaban - Plan to restart IV Heparin at 950 units/hr as has been therapeutic and stable at this rate - Continue daily aPTT/HL until  correlated - Will continue to monitor for any signs/symptoms of bleeding and will follow up with aPTT level in the a.m.   Thank you for allowing pharmacy to be a part of this patient's care.  9/07, PharmD PGY1 Pharmacy Resident 01/08/2021    Please check [email or web address] for unit-specific pharmacy phone numbers.

## 2021-01-08 NOTE — Progress Notes (Signed)
IR was requested for image guided gastrostomy tube placement.   Case was reviewed and approved by Dr. Elby Showers.  The procedure is tentatively schedule for next Monday pending IR schedule.  Stop tube feeding at midnight Monday. Heparin infusion will need to be discontinued 6 hours prior to the procedure.  IR will contact the floor with definitive time of the procedure next week.   Formal consult to follow. Please call IR for questions and concerns.    Lynann Bologna Starletta Houchin PA-C 01/08/2021 3:25 PM

## 2021-01-08 NOTE — Progress Notes (Signed)
NAME:  CYNDRA FEINBERG, MRN:  [id number], DOB:  11/13/1950, LOS: 5 ADMISSION DATE:  01/03/2021, CONSULTATION DATE:  01/03/2021 REFERRING MD:  EDP - [facility] CHIEF COMPLAINT:  SOB, encephalopathy   History of Present Illness:  70 year old female with PMHx significant for obesity, hypothyroidism, T2DM, chronic Afib (on Eliquis), CAD with prior NSTEMI, diastolic HF (last Echo 7/4 EF 93-71%), pulmonary HTN, chronic hypoxemic respiratory failure (home baseline O2 requirement 2L [place]), seizure disorder, CKD3, dementia who presented to [facility] 7/3 for AMS, increased WOB and SOB. Transferred to [facility]. O2 sat noted as low as 65% per EMS. Placed on NRB with sat improving to 100%, though PCO2 noted to be 72. Patient placed on BiPAP, but uncooperative/unable to tolerate and subsequently intubated. S/p tracheostomy 7/7.  Of note, patient recently admitted 6/20 - 6/29 for acute-on-chronic hypoxic/hypercapnic respiratory failure 2/2 CHF exacerbation.   She remains critically ill in the 78M ICU. Pertinent  Medical History  Obesity Type 2 diabetes,  Chronic atrial fibrillation on Eliquis Dementia Diastolic CHF with chronic hypoxemia on 2 L nasal cannula Hypothyroidism Seizure disorder CKD stage III CAD with prior NSTEMI Dyslipidemia  GERD NSTEMI 08/2020  Significant Hospital Events: Including procedures, antibiotic start and stop dates in addition to other pertinent events   Admitted to ICU on 01/03/21, transfer to [facility]. 7/3 CT LLL PNA.  7/4-7/5 Hypotension overnight. A line placed. LR bolus. Started on NE 7/6 Hgb 6.7 (7.4), CT A/P completed to r/o RP bleed (negative), showed mild peripancreatic stranding. Lipase slightly elevated. 1U PRBCs ordered. 7/7 Tracheostomy placed, heparin gtt held for procedure. 7/8 Increased agitation with Propofol wean, increased HR, uptrending pressor requirements. Cortrak today.  Antibiotics Ceftriaxone 7/3 x 1 Azithromycin 7/3 >>  Cefepime 7/4 >>  Interim History / Subjective:   No significant events overnight Generally agitated this morning on exam Propofol off, still not responding purposefully Increased HR with agitation Slowly uptrending pressor requirements S/p bedside trach yesterday, tolerated well Cortrak planned for today   Objective:  Blood pressure (!) 126/45, pulse 96, temperature 98 F (36.7 C), temperature source Oral, resp. rate (!) 22, height 5\' 8"  (1.727 m), weight 107.9 kg, SpO2 97 %.    Vent Mode: PRVC FiO2 (%):  [30 %] 30 % Set Rate:  [20 bmp] 20 bmp Vt Set:  [510 mL] 510 mL PEEP:  [5 cmH20] 5 cmH20 Plateau Pressure:  [20 cmH20-28 cmH20] 20 cmH20   Intake/Output Summary (Last 24 hours) at 01/08/2021 0840 Last data filed at 01/08/2021 0600 Gross per 24 hour  Intake 3065.48 ml  Output 1450 ml  Net 1615.48 ml    Filed Weights   01/06/21 0408 01/07/21 0500 01/08/21 0500  Weight: 107.4 kg 108.1 kg 107.9 kg   Physical Examination: General: Chronically ill-appearing elderly woman, appears agitated. HEENT: [place]/AT, anicteric sclera, PERRL, upward nonfocal gaze. Moist mucous membranes.  Neuro:  Agitated, but not purposefully responsive.  Withdraws to pain in 4 extremities. Not following commands. Moves all 4 extremities spontaneously. +Cough and +Gag  CV: Tachycardic, irregularly irregular rhythm, no m/g/r. PULM: Breathing even and minimally labored on vent via trach (PEEP 5, FiO2 30%). Lung fields coarse bilaterally, diminished at bases. GI: Soft, nontender, nondistended. Normoactive bowel sounds. Extremities: Bilateral 1+ LE edema noted, bilateral symmetric 1-2+ UE edema with mild erythema, RUE with ecchymosis of upper arm, PICC in place. Skin: Warm/dry, ecchymosis/mild erythema as above. Multiple small excoriations to BLE.  Resolved Hospital Problem List   N/A  Assessment & Plan:   Acute  combined hypoxic and hypercapnic respiratory failure Multifactorial secondary to CHF, pulmonary hypertension, restrictive lung disease secondary to  kyphosis [no evidence of COPD], likely has a component of OSA/obesity hypoventilation Requires 2L supplemental O2 via [place] at baseline at home. S/p tracheostomy creation 7/7. - Continue full vent support (4-8cc/kg IBW) - Hopeful for improved sedation weaning/better ability to wean vent with trach in place - Wean FiO2 for O2 sat > 90% - Daily WUA/SBT - VAP bundle - Pulmonary hygiene - PAD protocol: Propofol and Fentanyl for goal RASS 0 to -1, not tolerating weaning due to agitation - Lasix BID, assess further diuresis needs daily  Septic shock present on admission secondary to pneumonia CT reviewed with new infiltrate suggestive of pneumonia. Hypotension overnight 7/5. A-line placed. LR bolus given. CXR demonstrating bibasilar infiltrate/atelectasis. MRSA nasal swab negative, vanc discontinued. - Continue cefepime, azithromycin - F/u finalized Cx data - Narrow abx as able - Goal MAP > 65, titrate Levo to goal]  Acute on chronic anemia, likely multifactorial CT A/P completed 7/6 to r/o RP bleed (negative), no signs of overt bleeding on CT or on physical examination. S/p 1U PRBCs 7/6 with good response - Trend H&H - Transfuse for Hgb < 7.0  Acute encephalopathy, multifactorial with metabolic component secondary to hypercarbia, sepsis History of stroke, seizure CT head negative for acute processes. - Continue Keppra, gabapentin - Freuqent neuro exams - PAD protocol as above, RASS goal 0 to -1, weaning sedation as able  AFib on Eliquis Elevated troponin secondary to demand Pulm HTN-RVSP in 70's Very large PA, RVSP on Echo 5/22. Remains in Afib, rate controlled. Repeat Echo 7/4 demonstrating LVEF 45-50%, global hypokinesis, moderate concentric LVH; RVSF moderately reduced. Estimated RVSP . - Continue heparin gtt - Transition back to home Eliquis once no longer requiring procedures - Monitor aPTT/heparin levels - Continue amiodarone  Acute kidney injury superimposed  on CKD stage III Creat 1.0 on admission, uptrending. Suspect multifactorial with most recent bump secondary to hypotension overnight 7/5. - Trend BMP - Replete electrolytes as indicated - Monitor I&Os - Avoid nephrotoxic agents as able - Ensure adequate renal perfusion  Acute electrolyte abnormalities Hypokalemia Hypophosphatemia - Trend BMP, phos, Mg - Replete as indicated for goal K > 4, Mg > 2 - Monitor closely in the setting of diuresis  T2DM CBGs remain elevated, TF coverage added 7/6. - Basal insulin/coverage held 7/7, given loss of enteral access - Resume Lantus 20U when enteral access reestablished/TF resumed (7/8) - TF coverage when feeds resumed - SSI  Inadequate oral intake r/t ability to eat d/t NPO status - Appreciate Nutrition/RD assistance - Loss of enteral access 7/7 with extubation in the setting of trach - Cortrak to be placed today, 7/8  Mood disorder, dementia Chronic pain - Continue gabapentin - Hold cymbalta  Hypothyroidism - Continue levothyroxine  Goals of care - S/p trach 7/7 - See Dr. Shirlee More comments below - Family wishes to continue aggressive measures at this time  Per Dr. Isaiah Serge 7/4: "This is her fourth admission this year. Per notes she is  mostly housebound on 2lpm 24/7 with baseline dementia May be headed towards a tracheostomy Doubt if she will be able to tolerate noninvasive ventilation at home due to dementia.  Daughter does not want to place her in a facility but is still considering a trach since she was trached 3 years ago and was decannulated. We discussed today that this time may be different and she may not be able to come  off support. Will need to readdress goals of care.  Palliative care consult placed  Best Practice (right click and "Reselect all SmartList Selections" daily)   Diet/type: tubefeeds - Cortrak today 7/8 DVT prophylaxis: systemic heparin GI prophylaxis: PPI Lines: yes and it is still needed Foley:  Yes,  and it is still needed Code Status:  full code Last date of multidisciplinary goals of care discussion [7/4]  Critical care time: 32 minutes   Tim Lair, PA-C [facility] 01/08/21 8:40 AM  Please see [email or web address] for pager details.  From 7A-7P if no response, please call [phone] After hours, please call ELink [phone]

## 2021-01-08 NOTE — Progress Notes (Signed)
Pharmacy Antibiotic Note  [name] is a 70 y.o. female admitted on 01/03/2021 with pneumonia.  Pharmacy has been consulted for Cefepime dosing.  Scr up from 1.29 to 1.53, CrCl 43 ml/min.  Plan: - Continue Cefepime 2g IV every 12 hours - Will continue to follow renal function, culture results, LOT, and antibiotic de-escalation plans   Height: 5\' 8"  (172.7 cm) Weight: 107.9 kg (237 lb 14 oz) IBW/kg (Calculated) : 63.9  Temp (24hrs), Avg:97.6 F (36.4 C), Min:95.9 F (35.5 C), Max:100 F (37.8 C)  Recent Labs  Lab 01/04/21 2356 01/05/21 0438 01/05/21 1948 01/06/21 0443 01/07/21 0531 01/08/21 0500 01/08/21 0854  WBC 17.1* 18.0*  --  14.2* 10.0 14.2*  --   CREATININE  --  1.31* 1.17* 1.19* 1.29*  --  1.54*    Estimated Creatinine Clearance: 43.1 mL/min (A) (by C-G formula based on SCr of 1.54 mg/dL (H)).    Allergies  Allergen Reactions   Citalopram Hydrobromide Other (See Comments)    Dyskinesia Other reaction(s): Other (See Comments) Dyskinesia   Codeine Nausea And Vomiting and Other (See Comments)    HALLUCINATIONS Other reaction(s): Unknown Other reaction(s): Other (See Comments) HALLUCINATIONS    Hydromorphone Hcl Other (See Comments)    Made her pass out Other reaction(s): Other (See Comments) Made her pass out   Metoclopramide Other (See Comments)    DYSKINESIA Other reaction(s): Other (See Comments) DYSKINESIA   Hyoscyamine Other (See Comments)    MADE DIARRHEA WORSE    Antimicrobials this admission: Vancomycin 7/4>> 7/5 Cefepime 7/4>> Azithromycin 7/3>>  Dose adjustments this admission: 7/5 Cefepime 2g q8h > q12h  Microbiology results: 7/3 Fluvid >> neg 7/3 MRSA PCR >> neg 7/3 RCx (TA) >> normal respiratory flora 7/4 BCx x2 >> ng x4d  Thank you for allowing pharmacy to be a part of this patient's care.  9/3, PharmD PGY1 Pharmacy Resident 01/08/2021    Please check [email or web address] for unit-specific pharmacy phone numbers.

## 2021-01-08 NOTE — Procedures (Signed)
Cortrak  Person Inserting Tube:  [employer], RD Tube Type:  Cortrak - 43 inches Tube Size:  10 Tube Location:  Right nare Initial Placement:  Stomach Secured by: Bridle Technique Used to Measure Tube Placement:  Marking at nare/corner of mouth Cortrak Secured At:  70 cm  Cortrak Tube Team Note:  Consult received to place a Cortrak feeding tube.   X-ray is required, abdominal x-ray has been ordered by the Cortrak team. Please confirm tube placement before using the Cortrak tube.   If the tube becomes dislodged please keep the tube and contact the Cortrak team at [email or web address] ([email or web address]) for replacement.  If after hours and replacement cannot be delayed, place a NG tube and confirm placement with an abdominal x-ray.   Vanessa Kick MS, RD, LDN, CNSC Clinical Nutrition Pager listed in AMION

## 2021-01-08 NOTE — Progress Notes (Signed)
SLP Cancellation Note  Patient Details Name: [name] MRN: [id number] DOB: 1951-01-26   Cancelled treatment:       Reason Eval/Treat Not Completed: Patient not medically ready. Patient with new tracheostomy 7/7 and pt is currently on the vent. Orders for SLP eval and treat for PMSV and swallowing received. Will follow pt closely for readiness for SLP interventions as appropriate.   Ladavia Lindenbaum I. Vear Clock, MS, CCC-SLP Acute Rehabilitation Services Office number [phone] Pager [phone]   [name] 01/08/2021, 8:06 AM

## 2021-01-09 ENCOUNTER — Inpatient Hospital Stay (HOSPITAL_COMMUNITY): Payer: PPO

## 2021-01-09 DIAGNOSIS — J9622 Acute and chronic respiratory failure with hypercapnia: Secondary | ICD-10-CM | POA: Diagnosis not present

## 2021-01-09 DIAGNOSIS — J9621 Acute and chronic respiratory failure with hypoxia: Secondary | ICD-10-CM | POA: Diagnosis not present

## 2021-01-09 LAB — CULTURE, BLOOD (ROUTINE X 2)
Culture: NO GROWTH
Culture: NO GROWTH
Special Requests: ADEQUATE

## 2021-01-09 LAB — CBC
HCT: 23.9 % — ABNORMAL LOW (ref 36.0–46.0)
Hemoglobin: 7.5 g/dL — ABNORMAL LOW (ref 12.0–15.0)
MCH: 28.4 pg (ref 26.0–34.0)
MCHC: 31.4 g/dL (ref 30.0–36.0)
MCV: 90.5 fL (ref 80.0–100.0)
Platelets: 167 10*3/uL (ref 150–400)
RBC: 2.64 MIL/uL — ABNORMAL LOW (ref 3.87–5.11)
RDW: 18.4 % — ABNORMAL HIGH (ref 11.5–15.5)
WBC: 14.2 10*3/uL — ABNORMAL HIGH (ref 4.0–10.5)
nRBC: 0.1 % (ref 0.0–0.2)

## 2021-01-09 LAB — BASIC METABOLIC PANEL
Anion gap: 7 (ref 5–15)
BUN: 28 mg/dL — ABNORMAL HIGH (ref 8–23)
CO2: 24 mmol/L (ref 22–32)
Calcium: 8.1 mg/dL — ABNORMAL LOW (ref 8.9–10.3)
Chloride: 106 mmol/L (ref 98–111)
Creatinine, Ser: 1.37 mg/dL — ABNORMAL HIGH (ref 0.44–1.00)
GFR, Estimated: 41 mL/min — ABNORMAL LOW (ref >60)
Glucose, Bld: 237 mg/dL — ABNORMAL HIGH (ref 70–99)
Potassium: 3.4 mmol/L — ABNORMAL LOW (ref 3.5–5.1)
Sodium: 137 mmol/L (ref 135–145)

## 2021-01-09 LAB — GLUCOSE, CAPILLARY
Glucose-Capillary: 189 mg/dL — ABNORMAL HIGH (ref 70–99)
Glucose-Capillary: 193 mg/dL — ABNORMAL HIGH (ref 70–99)
Glucose-Capillary: 206 mg/dL — ABNORMAL HIGH (ref 70–99)
Glucose-Capillary: 208 mg/dL — ABNORMAL HIGH (ref 70–99)
Glucose-Capillary: 226 mg/dL — ABNORMAL HIGH (ref 70–99)
Glucose-Capillary: 232 mg/dL — ABNORMAL HIGH (ref 70–99)

## 2021-01-09 LAB — HEPARIN LEVEL (UNFRACTIONATED): Heparin Unfractionated: 0.39 IU/mL (ref 0.30–0.70)

## 2021-01-09 LAB — APTT: aPTT: 74 seconds — ABNORMAL HIGH (ref 24–36)

## 2021-01-09 LAB — MAGNESIUM: Magnesium: 2.1 mg/dL (ref 1.7–2.4)

## 2021-01-09 IMAGING — DX DG CHEST 2V
2 series · 2 of 2 positions shown · non-contrast
Comparison: 01/27/2018

CLINICAL DATA: Cough

EXAM:
CHEST - 2 VIEW

[chest pa]
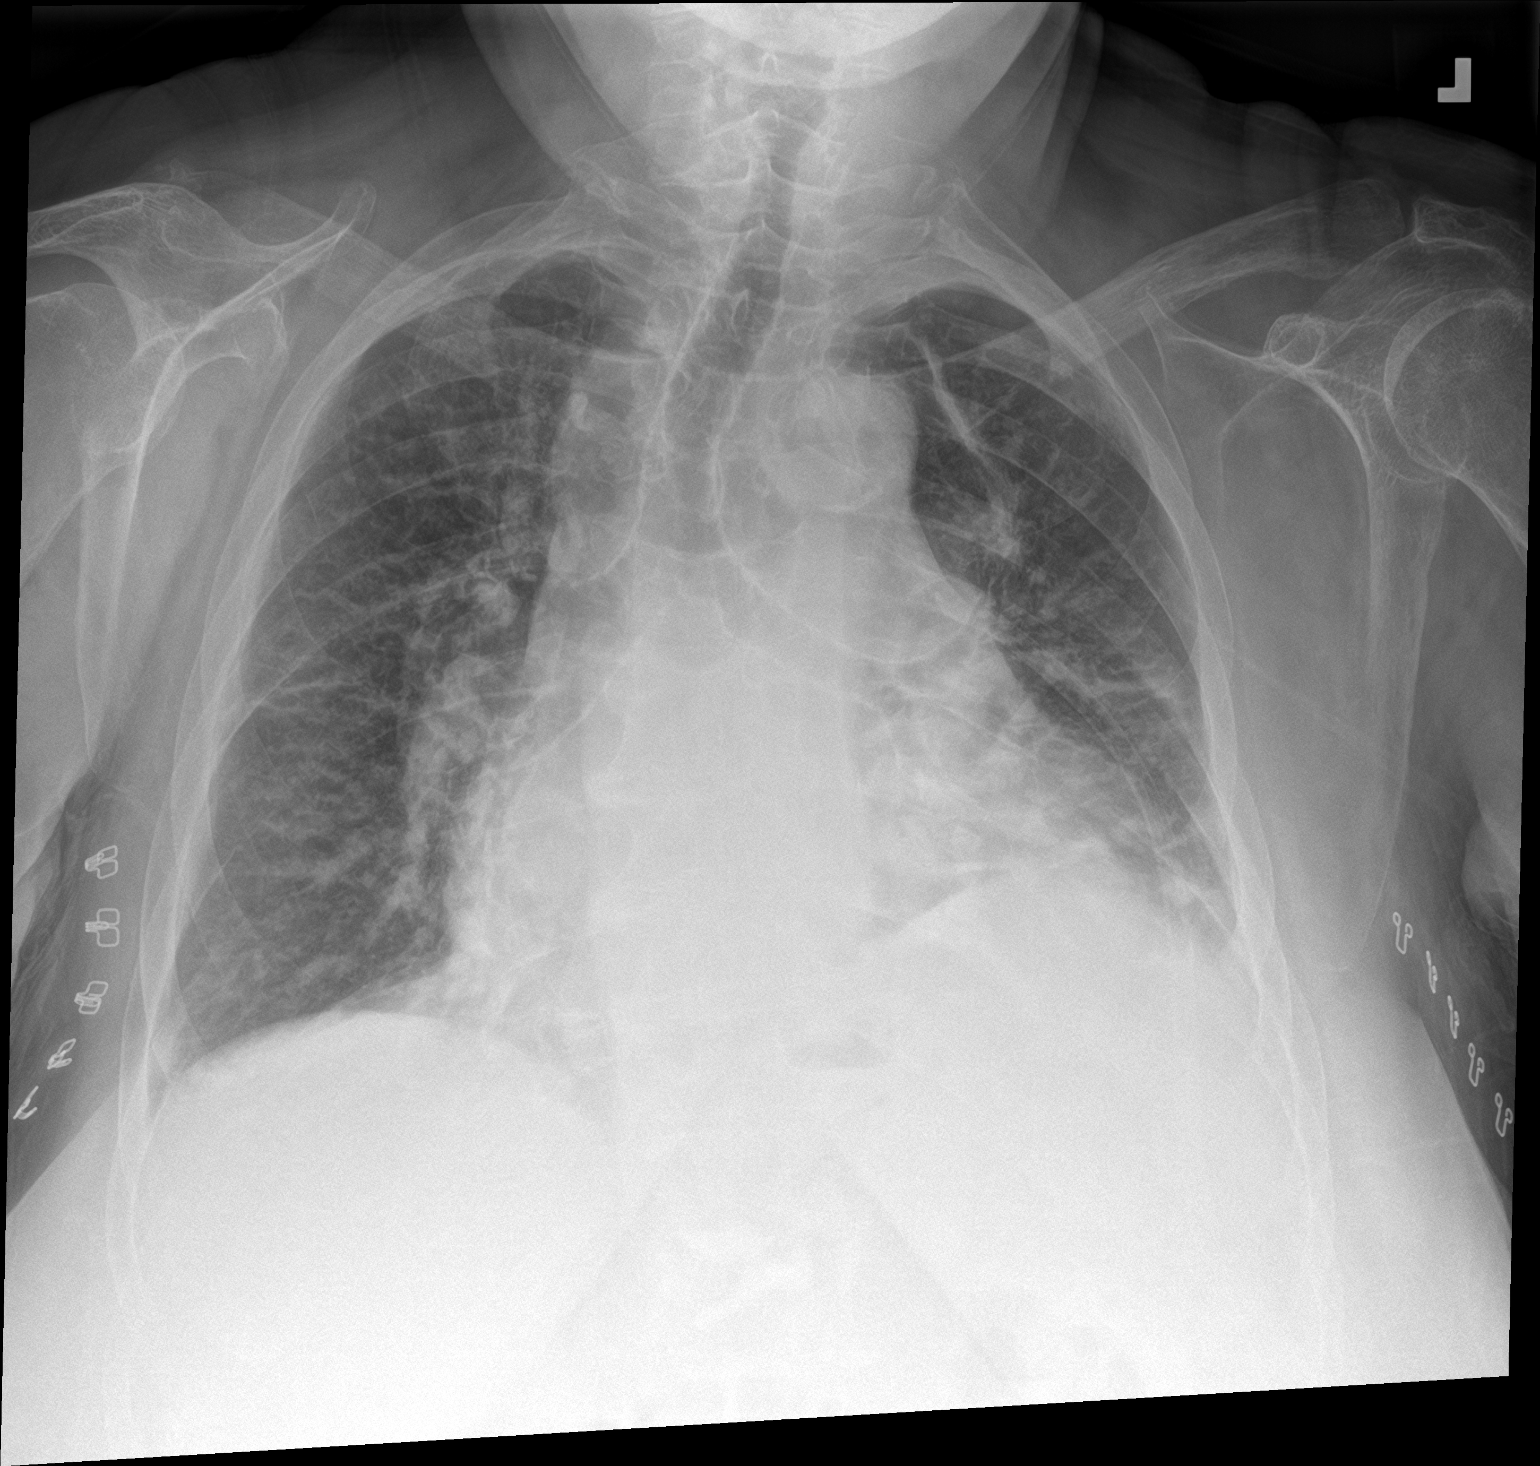

[chest lat]
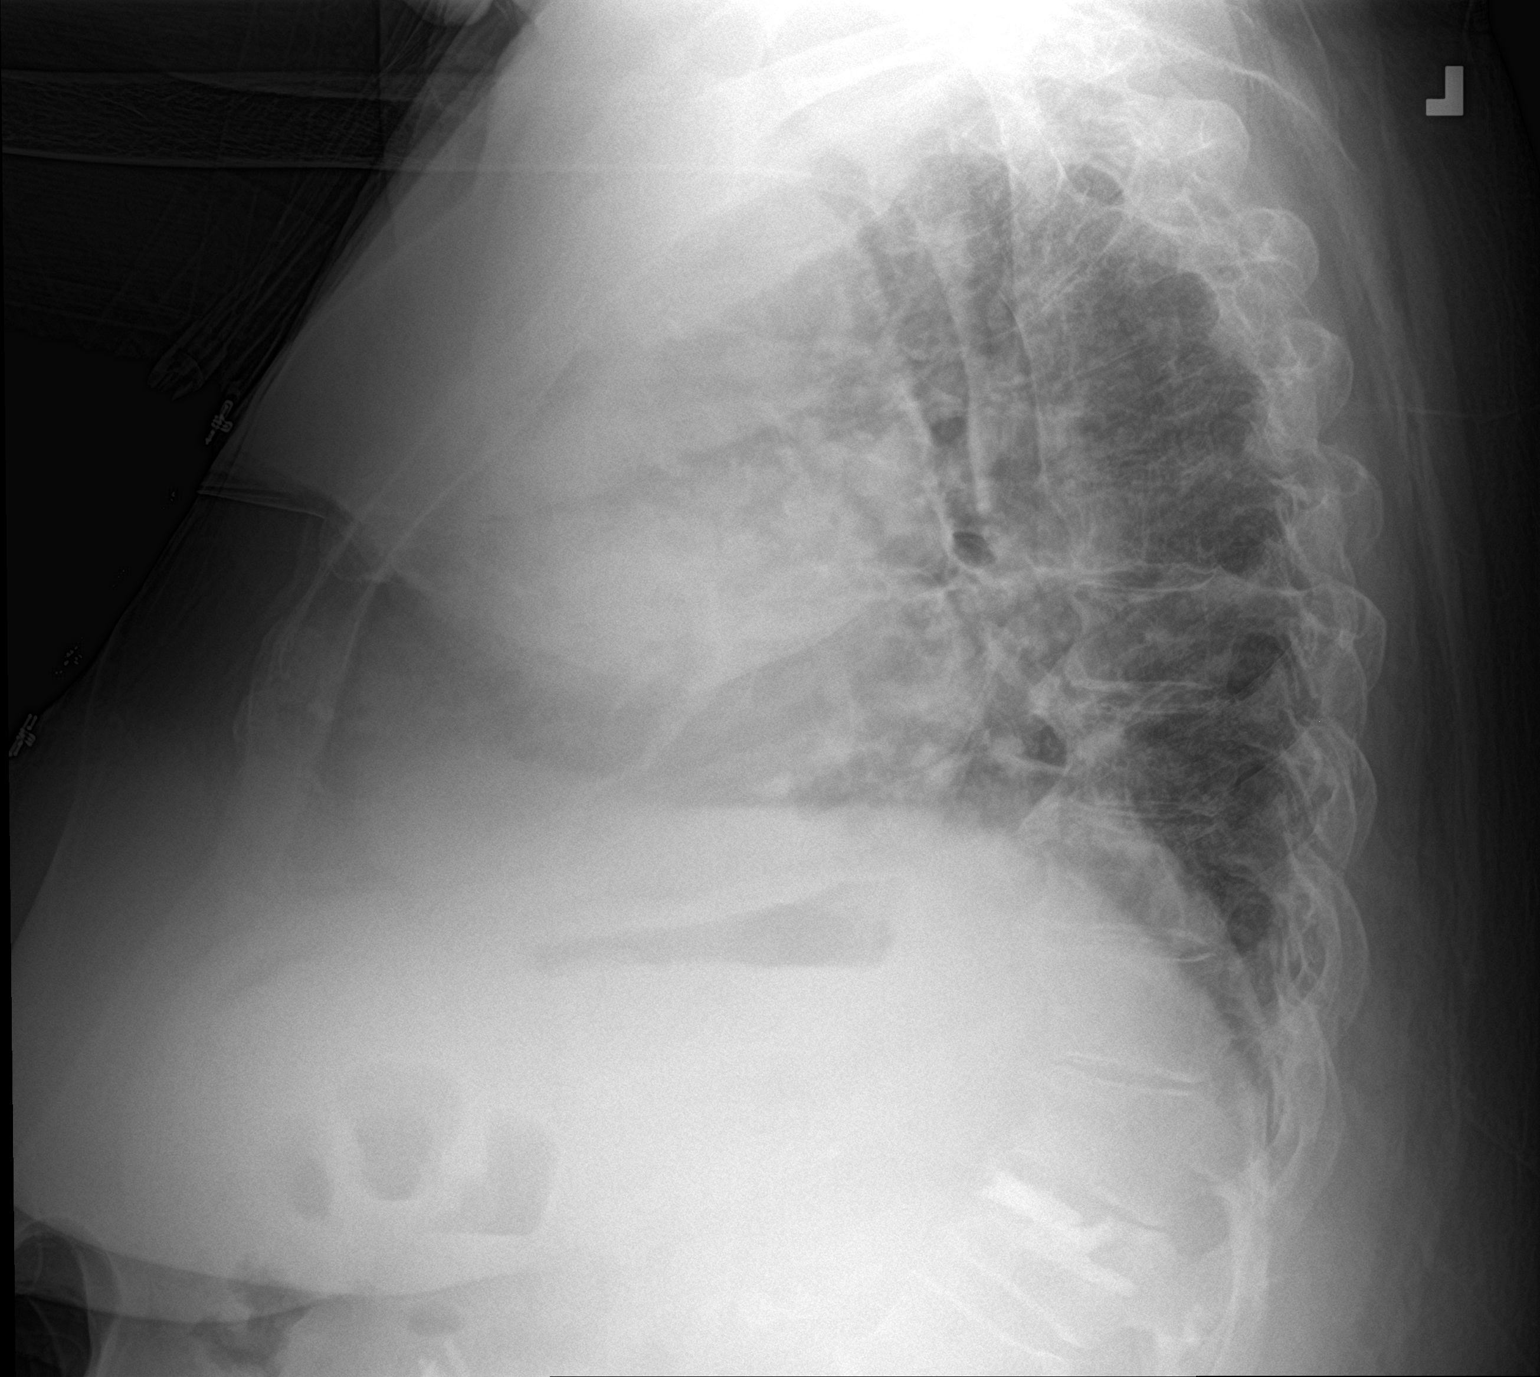

[2 of 2 positions shown; findings below may reference images not displayed]

FINDINGS: Cardiomegaly. Patchy airspace disease in the left lung base stable
compatible with scarring. Mild vascular congestion. No overt edema
or effusions. No acute bony abnormality.
IMPRESSION: Cardiomegaly with vascular congestion.

Stable left basilar scarring.

## 2021-01-09 MED ORDER — HYDROCORTISONE NA SUCCINATE PF 100 MG IJ SOLR
50.0000 mg | Freq: Four times a day (QID) | INTRAMUSCULAR | Status: DC
  Administered 2021-01-09 – 2021-01-11 (×8): 50 mg via INTRAVENOUS
  Filled 2021-01-09 (×8): qty 2

## 2021-01-09 MED ORDER — FENTANYL CITRATE (PF) 100 MCG/2ML IJ SOLN: 25.0000 ug | INTRAMUSCULAR | Status: DC | PRN

## 2021-01-09 MED ORDER — FUROSEMIDE 40 MG PO TABS
20.0000 mg | ORAL_TABLET | Freq: Every day | ORAL | Status: DC
  Administered 2021-01-10 – 2021-01-21 (×12): 20 mg
  Filled 2021-01-09 (×12): qty 1

## 2021-01-09 MED ORDER — POTASSIUM CHLORIDE 20 MEQ PO PACK
40.0000 meq | PACK | Freq: Once | ORAL | Status: AC
  Administered 2021-01-09: 40 meq
  Filled 2021-01-09: qty 2

## 2021-01-09 MED ORDER — MIDAZOLAM HCL 2 MG/2ML IJ SOLN: 1.0000 mg | INTRAMUSCULAR | Status: DC | PRN

## 2021-01-09 MED ORDER — DEXTROSE 10 % IV SOLN: INTRAVENOUS | Status: DC

## 2021-01-09 MED ORDER — ONDANSETRON HCL 4 MG/2ML IJ SOLN
4.0000 mg | Freq: Four times a day (QID) | INTRAMUSCULAR | Status: DC | PRN
  Administered 2021-01-09 – 2021-02-14 (×23): 4 mg via INTRAVENOUS
  Filled 2021-01-09 (×24): qty 2

## 2021-01-09 NOTE — Progress Notes (Signed)
eLink Physician-Brief Progress Note Patient Name: [name] DOB: July 16, 1950 MRN: [id number]   Date of Service  01/09/2021  HPI/Events of Note  Recurrent vomiting today.  Has feeding tube in place post pyloric.  Abd soft.  eICU Interventions  NG tube placement and empty stomach.  After 6 hrs if no further nausea will clamp NG and follow.  Tube feeds on hold.        Henry Russel, P 01/09/2021, 8:10 PM

## 2021-01-09 NOTE — Progress Notes (Signed)
RT NOTE: attempted SBT on patient this AM however patient had a low RR and VE.  Placed patient back on full support ventilator settings and is currently tolerating well at this time.  Will continue to monitor.

## 2021-01-09 NOTE — Progress Notes (Signed)
NAME:  [name], MRN:  [id number], DOB:  1950-08-23, LOS: 6 ADMISSION DATE:  01/03/2021, CONSULTATION DATE:  01/03/2021 REFERRING MD:  EDP - [facility] CHIEF COMPLAINT:  SOB, encephalopathy   History of Present Illness:  70 yo female presented to [facility] on 7/03 with dyspnea, and altered mental status.  Transferred to [facility].  SpO2 65% and elevated PCO2.  Failed Bipap and required intubation.  Had admission from 12/21/20 to 12/30/20 for respiratory failure and CHF exacerbation.  Pertinent  Medical History  Kyphoscoliosis, Restrictive lung disease, Chronic respiratory failure on 2 liters, DM type 2, Chronic a fib on eliquis, Dementia, Chronic combined CHF, Hypothyroidism, Seizures, CKD 3a, CAD s/p NSTEMI, HLD, GERD  Significant Hospital Events: Including procedures, antibiotic start and stop dates in addition to other pertinent events   Admitted to ICU on 01/03/21, transfer to [facility]. 7/3 CT LLL PNA.  7/4-7/5 Hypotension overnight. A line placed. LR bolus. Started on NE 7/6 Hgb 6.7 (7.4), CT A/P completed to r/o RP bleed (negative), showed mild peripancreatic stranding. Lipase slightly elevated. 1U PRBCs ordered. 7/7 Tracheostomy placed, heparin gtt held for procedure. 7/8 Increased agitation with Propofol wean, increased HR, uptrending pressor requirements. Cortrak today.  Antibiotics Ceftriaxone 7/3 x 1 Azithromycin 7/3 >>  Cefepime 7/4 >>  Interim History / Subjective:  Low Vt and RR with SBT.  Objective:  Blood pressure (!) 101/56, pulse (!) 101, temperature 99.1 F (37.3 C), temperature source Axillary, resp. rate 18, height 5\' 8"  (1.727 m), weight 107.4 kg, SpO2 90 %.    Vent Mode: PRVC FiO2 (%):  [30 %] 30 % Set Rate:  [20 bmp] 20 bmp Vt Set:  [50 mL-510 mL] 510 mL PEEP:  [5 cmH20] 5 cmH20 Plateau Pressure:  [19 cmH20-23 cmH20] 22 cmH20   Intake/Output Summary (Last 24 hours) at 01/09/2021 1026 Last data filed at 01/09/2021 1000 Gross per 24 hour  Intake 2826.68 ml  Output 2250 ml   Net 576.68 ml   Filed Weights   01/07/21 0500 01/08/21 0500 01/09/21 0500  Weight: 108.1 kg 107.9 kg 107.4 kg   Physical Examination:  General - somnolent Eyes - pupils reactive ENT - trach site clean Cardiac - irregular Chest - decreased breath sounds Lt base, scattered rhonchi, no wheeze Abdomen - soft, non tender, + bowel sounds Extremities - 2+ edema Skin - areas of excoriation on legs Neuro - RASS -1, weak in legs  Resolved Hospital Problem List   Septic shock from pneumonia  Assessment & Plan:   Acute on chronic hypoxic/hypercapnic respiratory failure from acute pulmonary edema, pneumonia. Restrictive lung disease from kyphoscolosis, sleep disordered breathing. Failure to wean from vent s/p tracheostomy on 01/07/21. - goal SpO2 > 92% - f/u CXR intermittently - d/c trach sutures on or after 01/14/21 - likely will be slow weaning process from vent  Relative adrenal insufficiency. - solu cortef 50 mg q6h - midodrone  Acute metabolic encephalopathy 2nd to sepsis and hypercapnia. Hx of CVA, seizures, dementia, depression. - RASS goal 0 - prn versed, fentanyl - continue neurontin, keppra - hold outpt cymbalta  Anemia of critical illness and chronic disease. - f/u CBC - transfuse for Hb < 7  Permanent atrial fibrillation. Acute on chronic combined CHF. HLD. - continue amiodarone, lipitor, lasix - continue heparin gtt; can transition to enteral agent after she gets peg  CKD 3a. - f/u BMET  DM type 2 poorly controlled with hyperglycemia. - SSI with lantus  Hypothyroidism. - continue synthroid  Dysphagia. -  tentative plan for PEG with IR on 01/11/21  Best Practice (right click and "Reselect all SmartList Selections" daily)   Diet/type: tubefeeds - Cortrak today 7/8 DVT prophylaxis: systemic heparin GI prophylaxis: PPI Lines: yes and it is still needed Foley:  Yes, and it is still needed Code Status:  full code Last date of multidisciplinary goals of  care discussion [7/4] Disposition: family agreeable to LTAC placement and social work looking into [facility]  Updated pt's daughter at bedside on 01/09/21.  Labs:   CMP Latest Ref Rng & Units 01/09/2021 01/08/2021 01/07/2021  Glucose 70 - 99 mg/dL 818(E) 993(Z) 169(C)  BUN 8 - 23 mg/dL 78(L) 38(B) 23  Creatinine 0.44 - 1.00 mg/dL 0.17(P) 1.02(H) 8.52(D)  Sodium 135 - 145 mmol/L 137 136 138  Potassium 3.5 - 5.1 mmol/L 3.4(L) 3.8 3.1(L)  Chloride 98 - 111 mmol/L 106 106 106  CO2 22 - 32 mmol/L 24 24 23   Calcium 8.9 - 10.3 mg/dL 8.1(L) 8.1(L) 7.7(L)  Total Protein 6.5 - 8.1 g/dL - - -  Total Bilirubin 0.3 - 1.2 mg/dL - - -  Alkaline Phos 38 - 126 U/L - - -  AST 15 - 41 U/L - - -  ALT 0 - 44 U/L - - -    CBC Latest Ref Rng & Units 01/09/2021 01/08/2021 01/07/2021  WBC 4.0 - 10.5 K/uL 14.2(H) 14.2(H) 10.0  Hemoglobin 12.0 - 15.0 g/dL 7.5(L) 8.4(L) 7.3(L)  Hematocrit 36.0 - 46.0 % 23.9(L) 26.2(L) 22.5(L)  Platelets 150 - 400 K/uL 167 183 140(L)    ABG    Component Value Date/Time   PHART 7.554 (H) 01/03/2021 2326   PCO2ART 38.1 01/03/2021 2326   PO2ART 270 (H) 01/03/2021 2326   HCO3 33.6 (H) 01/03/2021 2326   TCO2 35 (H) 01/03/2021 2326   ACIDBASEDEF 1.0 01/26/2015 1132   O2SAT 100.0 01/03/2021 2326    CBG (last 3)  Recent Labs    01/08/21 2337 01/09/21 0351 01/09/21 0752  GLUCAP 206* 232* 226*    Signature:  03/12/21, MD [facility] Pulmonary/Critical Care Pager - [phone] 01/09/2021, 10:40 AM

## 2021-01-09 NOTE — Progress Notes (Signed)
Patient had a large amount of emesis. TF stopped. MD notified and requesting antiemetics.

## 2021-01-09 NOTE — Progress Notes (Signed)
ANTICOAGULATION CONSULT NOTE  Pharmacy Consult for IV Heparin Indication: atrial fibrillation  Patient Measurements: Height: 5\' 8"  (172.7 cm) Weight: 107.4 kg (236 lb 12.4 oz) IBW/kg (Calculated) : 63.9 Heparin Dosing Weight: 87.8 kg   Vital Signs: Temp: 98.8 F (37.1 C) (07/09 0351) Temp Source: Oral (07/09 0351) BP: 117/54 (07/09 0630) Pulse Rate: 68 (07/09 0630)  Labs: Recent Labs    01/07/21 0500 01/07/21 0531 01/08/21 0500 01/08/21 0854 01/09/21 0436  HGB  --  7.3* 8.4*  --  7.5*  HCT  --  22.5* 26.2*  --  23.9*  PLT  --  140* 183  --  167  APTT  --  99* 86*  --  74*  HEPARINUNFRC 0.77*  --  0.51  --  0.39  CREATININE  --  1.29*  --  1.54* 1.37*   Estimated Creatinine Clearance: 48.3 mL/min (A) (by C-G formula based on SCr of 1.37 mg/dL (H)).  Assessment: 70 yr old female with hx atrial fibrillation, for which was on apixaban PTA (last dose at 01/03/21 at 0800). Pharmacy was consulted to dose IV heparin while apixaban is on hold.   aPTT 76 and Heparin level is 0.39 - both remain therapeutic x2 on current rate of Heparin 950 units/hr. As levels now appear to be correlating and due to known inaccuracies of aPTT, will follow only Heparin levels going forward.   *Note plan for PEG tube placement on Monday -- will remain on IV heparin until this can be done then follow-up plan for transitioning back to home DOAC.   Goal of Therapy:  Heparin level 0.3-0.7 units/ml aPTT 66-102 seconds Monitor platelets by anticoagulation protocol: Yes   Plan:  - Continue IV Heparin at 950 units/hr.  - Daily Heparin level and CBC while on therapy - Will continue to monitor for any signs/symptoms of bleeding  Thank you for allowing pharmacy to be a part of this patient's care.  Monday, PharmD, BCPS, BCCCP Clinical Pharmacist Please refer to [facility] for [facility] Pharmacy numbers 01/09/2021, 7:28 AM

## 2021-01-09 NOTE — Progress Notes (Signed)
Ok to give meds through cortrak per Dr. Katrinka Blazing, MD. Will monitor for vomiting.   Cranford Mon

## 2021-01-09 NOTE — Progress Notes (Signed)
Pharmacy Electrolyte Replacement  Recent Labs:  Recent Labs    01/07/21 0531 01/08/21 0854 01/09/21 0436  K 3.1*   < > 3.4*  MG 2.1   < > 2.1  PHOS 3.9  --   --   CREATININE 1.29*   < > 1.37*   < > = values in this interval not displayed.    Low Critical Values (K </= 2.5, Phos </= 1, Mg </= 1) Present: None  MD Contacted: n/a no critical values  Plan: KCl 40 mEq per tube x1 dose Follow-up K in AM  Link Snuffer, PharmD, BCPS, BCCCP Clinical Pharmacist Please refer to [facility] for [facility] Pharmacy numbers 01/09/2021, 7:24 AM'

## 2021-01-10 ENCOUNTER — Inpatient Hospital Stay (HOSPITAL_COMMUNITY): Payer: PPO

## 2021-01-10 DIAGNOSIS — J9601 Acute respiratory failure with hypoxia: Secondary | ICD-10-CM | POA: Diagnosis not present

## 2021-01-10 DIAGNOSIS — J9622 Acute and chronic respiratory failure with hypercapnia: Secondary | ICD-10-CM | POA: Diagnosis not present

## 2021-01-10 DIAGNOSIS — R609 Edema, unspecified: Secondary | ICD-10-CM | POA: Diagnosis not present

## 2021-01-10 DIAGNOSIS — J9602 Acute respiratory failure with hypercapnia: Secondary | ICD-10-CM | POA: Diagnosis not present

## 2021-01-10 DIAGNOSIS — J9621 Acute and chronic respiratory failure with hypoxia: Secondary | ICD-10-CM | POA: Diagnosis not present

## 2021-01-10 LAB — CBC
HCT: 21.9 % — ABNORMAL LOW (ref 36.0–46.0)
Hemoglobin: 7 g/dL — ABNORMAL LOW (ref 12.0–15.0)
MCH: 28.6 pg (ref 26.0–34.0)
MCHC: 32 g/dL (ref 30.0–36.0)
MCV: 89.4 fL (ref 80.0–100.0)
Platelets: 158 10*3/uL (ref 150–400)
RBC: 2.45 MIL/uL — ABNORMAL LOW (ref 3.87–5.11)
RDW: 18.4 % — ABNORMAL HIGH (ref 11.5–15.5)
WBC: 12.5 10*3/uL — ABNORMAL HIGH (ref 4.0–10.5)
nRBC: 0 % (ref 0.0–0.2)

## 2021-01-10 LAB — GLUCOSE, CAPILLARY
Glucose-Capillary: 215 mg/dL — ABNORMAL HIGH (ref 70–99)
Glucose-Capillary: 224 mg/dL — ABNORMAL HIGH (ref 70–99)
Glucose-Capillary: 227 mg/dL — ABNORMAL HIGH (ref 70–99)
Glucose-Capillary: 227 mg/dL — ABNORMAL HIGH (ref 70–99)
Glucose-Capillary: 228 mg/dL — ABNORMAL HIGH (ref 70–99)
Glucose-Capillary: 244 mg/dL — ABNORMAL HIGH (ref 70–99)

## 2021-01-10 LAB — BASIC METABOLIC PANEL
Anion gap: 4 — ABNORMAL LOW (ref 5–15)
BUN: 26 mg/dL — ABNORMAL HIGH (ref 8–23)
CO2: 27 mmol/L (ref 22–32)
Calcium: 8.3 mg/dL — ABNORMAL LOW (ref 8.9–10.3)
Chloride: 108 mmol/L (ref 98–111)
Creatinine, Ser: 1.19 mg/dL — ABNORMAL HIGH (ref 0.44–1.00)
GFR, Estimated: 49 mL/min — ABNORMAL LOW (ref >60)
Glucose, Bld: 253 mg/dL — ABNORMAL HIGH (ref 70–99)
Potassium: 3.2 mmol/L — ABNORMAL LOW (ref 3.5–5.1)
Sodium: 139 mmol/L (ref 135–145)

## 2021-01-10 LAB — HEPARIN LEVEL (UNFRACTIONATED): Heparin Unfractionated: 0.36 IU/mL (ref 0.30–0.70)

## 2021-01-10 MED ORDER — FENTANYL CITRATE (PF) 100 MCG/2ML IJ SOLN
25.0000 ug | INTRAMUSCULAR | Status: DC | PRN
  Administered 2021-01-11 – 2021-01-14 (×2): 50 ug via INTRAVENOUS
  Filled 2021-01-10 (×2): qty 2

## 2021-01-10 MED ORDER — POTASSIUM CHLORIDE 10 MEQ/50ML IV SOLN
10.0000 meq | INTRAVENOUS | Status: AC
  Administered 2021-01-10 (×4): 10 meq via INTRAVENOUS
  Filled 2021-01-10 (×4): qty 50

## 2021-01-10 MED ORDER — DEXTROSE 5 % IV SOLN: INTRAVENOUS | Status: DC

## 2021-01-10 MED ORDER — POTASSIUM CHLORIDE 20 MEQ PO PACK
20.0000 meq | PACK | ORAL | Status: AC
  Administered 2021-01-10 (×2): 20 meq
  Filled 2021-01-10 (×2): qty 1

## 2021-01-10 NOTE — Progress Notes (Signed)
ANTICOAGULATION CONSULT NOTE  Pharmacy Consult for IV Heparin Indication: atrial fibrillation  Patient Measurements: Height: 5\' 8"  (172.7 cm) Weight: 103.6 kg (228 lb 6.3 oz) IBW/kg (Calculated) : 63.9 Heparin Dosing Weight: 87.8 kg   Vital Signs: Temp: 98.4 F (36.9 C) (07/10 0340) Temp Source: Oral (07/10 0340) BP: 127/84 (07/10 0700) Pulse Rate: 72 (07/10 0700)  Labs: Recent Labs    01/08/21 0500 01/08/21 0854 01/09/21 0436 01/10/21 0316  HGB 8.4*  --  7.5* 7.0*  HCT 26.2*  --  23.9* 21.9*  PLT 183  --  167 158  APTT 86*  --  74*  --   HEPARINUNFRC 0.51  --  0.39 0.36  CREATININE  --  1.54* 1.37* 1.19*   Estimated Creatinine Clearance: 54.6 mL/min (A) (by C-G formula based on SCr of 1.19 mg/dL (H)).  Assessment: 70 yr old female with hx atrial fibrillation, for which was on apixaban PTA (last dose at 01/03/21 at 0800). Pharmacy was consulted to dose IV heparin while apixaban is on hold.   Heparin level is 0.36 - therapeutic on current rate of Heparin 950 units/hr. Hgb 7, Platelets within normal limits. No bleeding noted.   *Note plan for PEG tube placement on Monday -- will remain on IV heparin until this can be done then follow-up plan for transitioning back to home DOAC.   Goal of Therapy:  Heparin level 0.3-0.7 units/ml aPTT 66-102 seconds Monitor platelets by anticoagulation protocol: Yes   Plan:  - Continue IV Heparin at 950 units/hr.  - Daily Heparin level and CBC while on therapy - Will continue to monitor for any signs/symptoms of bleeding  Thank you for allowing pharmacy to be a part of this patient's care.  Tuesday, PharmD, BCPS, BCCCP Clinical Pharmacist Please refer to [facility] for [facility] Pharmacy numbers 01/10/2021, 8:00 AM

## 2021-01-10 NOTE — Progress Notes (Addendum)
NAME:  [name], MRN:  [id number], DOB:  17-Dec-1950, LOS: 7 ADMISSION DATE:  01/03/2021, CONSULTATION DATE:  01/03/2021 REFERRING MD:  EDP - [facility] CHIEF COMPLAINT:  SOB, encephalopathy   History of Present Illness:  70 yo female presented to [facility] on 7/03 with dyspnea, and altered mental status.  Transferred to [facility]. SpO2 65% and elevated PCO2.  Failed Bipap and required intubation.  Had admission from 12/21/20 to 12/30/20 for respiratory failure and CHF exacerbation.  Pertinent  Medical History  Kyphoscoliosis, Restrictive lung disease, Chronic respiratory failure on 2 liters, DM type 2, Chronic a fib on eliquis, Dementia, Chronic combined CHF, Hypothyroidism, Seizures, CKD 3a, CAD s/p NSTEMI, HLD, GERD  Significant Hospital Events:  Admitted to ICU on 01/03/21, transfer to [facility]. 7/3 CT LLL PNA.  7/4-7/5 Hypotension overnight. A line placed. LR bolus. Started on NE 7/6 Hgb 6.7 (7.4), CT A/P completed to r/o RP bleed (negative), showed mild peripancreatic stranding. Lipase slightly elevated. 1U PRBCs ordered. 7/7 Tracheostomy placed, heparin gtt held for procedure. 7/8 Increased agitation with Propofol wean, increased HR, uptrending pressor requirements. Cortrak today.  Antibiotics Ceftriaxone 7/3 x 1 Azithromycin 7/3 >>  Cefepime 7/4 >>  Interim History / Subjective:  Vomiting overnight NG placed with stomach decompression   Objective:  Blood pressure 138/82, pulse 81, temperature 98.8 F (37.1 C), temperature source Oral, resp. rate 18, height 5\' 8"  (1.727 m), weight 103.6 kg, SpO2 100 %.    Vent Mode: PRVC FiO2 (%):  [30 %] 30 % Set Rate:  [20 bmp] 20 bmp Vt Set:  [510 mL] 510 mL PEEP:  [5 cmH20] 5 cmH20 Plateau Pressure:  [18 cmH20-23 cmH20] 18 cmH20   Intake/Output Summary (Last 24 hours) at 01/10/2021 0946 Last data filed at 01/10/2021 0900 Gross per 24 hour  Intake 1732.15 ml  Output 1400 ml  Net 332.15 ml    Filed Weights   01/08/21 0500 01/09/21 0500 01/10/21 03/13/21   Weight: 107.9 kg 107.4 kg 103.6 kg   Physical Examination: General: Acute on chronically ill appearing deconditioned elderly female on mechanical ventilation through trach, in NAD HEENT: 6 cuffed shiley trach midline, MM pink/moist, PERRL,  Neuro: Alert and oriented x3, non-focal CV: s1s2 regular rate and rhythm, no murmur, rubs, or gallops,  PULM:  Bilateral faint rhonchi, tolerating vent, no increased work of breathing GI: soft, bowel sounds active in all 4 quadrants, non-tender, non-distended Extremities: warm/dry, no edema  Skin: no rashes or lesions  Resolved Hospital Problem List   Septic shock from pneumonia  Assessment & Plan:   Acute on chronic hypoxic/hypercapnic respiratory failure from acute pulmonary edema, pneumonia. Restrictive lung disease from kyphoscolosis, sleep disordered breathing. Failure to wean from vent s/p tracheostomy on 01/07/21. P: Continue ventilator support with lung protective strategies  Routine trach care  Remove trach sutures 7/14 Wean PEEP and FiO2 for sats greater than 90%. Head of bed elevated 30 degrees. Follow intermittent chest x-ray and ABG.   Ensure adequate pulmonary hygiene  Follow cultures  VAP bundle in place  PAD protocol  Relative adrenal insufficiency. P: Remains on solu corted, started 7/9 Continue Midodrine   Acute metabolic encephalopathy 2nd to sepsis and hypercapnia. Hx of CVA, seizures, dementia, depression. P: Maintain neuro protective measures Nutrition and bowel regiment  Seizure precautions  Continue Keppra and Neurontin  Home Cymbalta remains on hold  Aspirations precautions   Anemia of critical illness and chronic disease. P: Hgb stabllly low at 7.0 No signs of bleeding or  hemodynamic compromise  Trend CBC  Permanent atrial fibrillation. Acute on chronic combined CHF. -ECHO 45-50% with global hypokinesis  HLD. P: Cardiology evaluated during admission, now signed off Continue per tube amiodarone,  lasix, and statin  Continuous telemetry  Continue heparin until PEG placed   CKD 3a. P: Follow renal function  Monitor urine output Trend Bmet Avoid nephrotoxins Ensure adequate renal perfusion   DM type 2 poorly controlled with hyperglycemia. P: Hold any further Lantus until PEG/enteral feeding plan can be established and consistent  Decreased dextrose  Stop tube feed coverage   Hypothyroidism. P: Continue Synthroid   Dysphagia. - tentative plan for PEG with IR on 01/11/21  Left upper extremity swelling and redness  P: Obtain doppler   Best Practice    Diet/type: tubefeeds - Cortrak today 7/8 DVT prophylaxis: systemic heparin GI prophylaxis: PPI Lines: yes and it is still needed Foley:  Yes, and it is still needed Code Status:  full code Last date of multidisciplinary goals of care discussion [7/4] Disposition: family agreeable to LTAC placement and social work looking into [facility]  Updated pt's daughter at bedside on 01/10/21.  Critical care:   Performed by: Whitney D. Harris  Total critical care time: 36 minutes  Critical care time was exclusive of separately billable procedures and treating other patients.  Critical care was necessary to treat or prevent imminent or life-threatening deterioration.  Critical care was time spent personally by me on the following activities: development of treatment plan with patient and/or surrogate as well as nursing, discussions with consultants, evaluation of patient's response to treatment, examination of patient, obtaining history from patient or surrogate, ordering and performing treatments and interventions, ordering and review of laboratory studies, ordering and review of radiographic studies, pulse oximetry and re-evaluation of patient's condition.  Whitney D. Tiburcio Pea, NP-C [place] [facility] Personal contact information can be found on Amion  01/10/2021, 10:22 AM   PCCM Attending:   70 yo FM, acute on  chronic hypoxemic hypercarbic resp failure, trach in place, multiple underlying reasons for failure to wean.   BP 138/82   Pulse 81   Temp 98.8 F (37.1 C) (Oral)   Resp 18   Ht 5\' 8"  (1.727 m)   Wt 103.6 kg   SpO2 100%   BMI 34.73 kg/m   Gen: obese, trach in place HENT: Trach in place, cdi  Heart: RRR s1 s2 Lungs: BL vented breaths  Abd: Obese soft   Labs reviewed   A:  Chronic respiratory failure with hypoxemia, Tracheostomy and ventilator dependent  I suspect will be a slow wean, likely needs LTACH  Relative adrenal insufficiency  Acute metabolic encephalopathy  Anemia of chronic illness  Permanent AF  Chronic hypotension  CKD3a DMII hyperglycemia   P: Weaning from vent as tolerated  I suspect will be a prolonged need  She will likely need LTACH  Plans for PEG tube tomorrrow by IR  Continue midodrine  Drop down hydrocortisone to once daily  Heparin ggt until after PEG then can switch  SSI + lantus   , DO [place] [facility] 01/10/2021 11:34 AM

## 2021-01-10 NOTE — Progress Notes (Signed)
VASCULAR LAB    Left upper extremity venous duplex has been performed.  See CV proc for preliminary results.   Margart Zemanek, RVT 01/10/2021, 2:34 PM

## 2021-01-10 NOTE — Progress Notes (Signed)
[facility] ADULT ICU REPLACEMENT PROTOCOL   The patient does apply for the [facility] Adult ICU Electrolyte Replacment Protocol based on the criteria listed below:   1. Is GFR >/= 30 ml/min? Yes.    Patient's GFR today is 49 2. Is SCr </= 2? Yes.   Patient's SCr is 1.19 ml/kg/hr 3. Did SCr increase >/= 0.5 in 24 hours? No. 4. Abnormal electrolyte(s): K+3.2 5. Ordered repletion with: protocol 6. If a panic level lab has been reported, has the CCM MD in charge been notified? Yes.  .   Physician:  Dr. Cornelius Moras, Lilia Argue 01/10/2021 5:19 AM

## 2021-01-10 NOTE — H&P (Signed)
Chief Complaint: Need for enteral nutrition  Referring Physician(s): Coralyn Helling, MD  Supervising Physician: Mir, Mauri Reading  Patient Status: [facility] - In-pt  History of Present Illness: [name] is a 70 y.o. female with cute on chronic hypoxic/hypercapnic respiratory failure from acute pulmonary edema and pneumonia.  She has restrictive lung disease from kyphoscolosis and sleep disordered breathing.  She has failed to wean from vent.  She is s/p tracheostomy on 01/07/21.  We are asked to place a gastrostomy tube for enteral nutrition.   Past Medical History:  Diagnosis Date   Abnormal pulmonary function test    Anemia    H/H of 10/30 with a normal MCV in 12/09   Anxiety    Arthritis    Barrett's esophagus    Diagnosed 1995. Last EGD 2016-NO BARRETT'S.    Chest pain    Negative cardiac catheterization in 2002; negative stress nuclear study in 2008   Chronic anticoagulation    Chronic combined systolic and diastolic CHF (congestive heart failure) (HCC)    a. EF predominantly normal during prior echoes but was 40% during 10/2014 echo. b. Most recent 01/2015 EF normal, 55-60%.   Chronic LBP    Surgical intervention in 1996   Diabetes mellitus, type 2 (HCC)    Insulin therapy; exacerbated by prednisone   Dysrhythmia    AFib   Gastroparesis    99% retention 05/2008 on GES   GERD (gastroesophageal reflux disease)    Heart attack (HCC) 08/13/2020   Hiatal hernia    Hyperlipidemia    Hypertension    Hypothyroid    IBS (irritable bowel syndrome)    Obesity    OSA on CPAP    had CPAP and cannot tolerate.   Paroxysmal atrial fibrillation (HCC)    Pulmonary hypertension (HCC) 01/2015   a. Predominantly pulmonary venous hypertension but may be component of PAH.   Seizures (HCC)    last seizure was 2 years ago; on keppra for this; unknown etiology   Syncope    a. Admitted 05/2009; magnetic resonance imagin/ MRA - negative; etiology thought to be orthostasis secondary  to drugs and dehydration. b. Syncope 02/2015 also felt 2/2 dehydration.    Past Surgical History:  Procedure Laterality Date   BACK SURGERY  1996   BIOPSY N/A 11/08/2013   Procedure: BIOPSY  / Tissue sampling / ulcers present in small intestine;  Surgeon: West Bali, MD;  Location: [facility];  Service: Endoscopy;  Laterality: N/A;   CARDIAC CATHETERIZATION  2002   CARDIAC CATHETERIZATION N/A 01/26/2015   Procedure: Right Heart Cath;  Surgeon: Laurey Morale, MD;  Location: [facility] INVASIVE CV LAB;  Service: Cardiovascular;  Laterality: N/A;   CARDIOVASCULAR STRESS TEST  2008   Stress nuclear study   CARDIOVERSION N/A 03/06/2015   Procedure: CARDIOVERSION;  Surgeon: Laurey Morale, MD;  Location: [facility] ENDOSCOPY;  Service: Cardiovascular;  Laterality: N/A;   CARPAL TUNNEL RELEASE  1994   COLONOSCOPY  11/2011   Dr. Darrick Penna: Internal hemorrhoids, mild diverticulosis. Random colon biopsies negative.   COLONOSCOPY N/A 11/08/2013   SLF: Normal mucosa in the terminal ileum/The colon IS redundant/  Moderate diverticulosis throughout the entire colon. ileum bx benign. colon bx benign   ESOPHAGOGASTRODUODENOSCOPY  2008   Barrett's without dysplasia. esphagus dilated. antral erosions, h.pylori serologies negative.   ESOPHAGOGASTRODUODENOSCOPY  11/2011   Dr. Darrick Penna: Barrett's esophagus, mild gastritis, diverticulum in the second portion of the duodenum repeat EGD 3 years. Small bowel  biopsies negative. Gastric biopsy show reactive gastropathy but no H. pylori. Esophageal biopsies consistent with GERD. Next EGD 11/2014   ESOPHAGOGASTRODUODENOSCOPY N/A 11/21/2014   QXI:HWTU non-erosive gastritis/irregular z-line   GIVENS CAPSULE STUDY  12/07/2011   Proximal small bowel, rare AVM. Distal small bowel, multiple ulcers noted   GIVENS CAPSULE STUDY N/A 09/27/2013   Distal small bowel ulcers extending to TI.   GIVENS CAPSULE STUDY N/A 10/10/2013   Procedure: GIVENS CAPSULE STUDY;  Surgeon: West Bali, MD;  Location:  [facility];  Service: Endoscopy;  Laterality: N/A;  7:30   IR KYPHO THORACIC WITH BONE BIOPSY  02/09/2018   KNEE ARTHROSCOPY WITH MEDIAL MENISECTOMY Right 06/09/2016   Procedure: KNEE ARTHROSCOPY WITH MEDIAL MENISECTOMY;  Surgeon: Vickki Hearing, MD;  Location: [facility] ORS;  Service: Orthopedics;  Laterality: Right;  medial and lateral menisectomy   LAMINECTOMY  1995   L4-L5   LAPAROSCOPIC CHOLECYSTECTOMY  1990s   LEFT HEART CATHETERIZATION WITH CORONARY ANGIOGRAM  01/10/2014   Procedure: LEFT HEART CATHETERIZATION WITH CORONARY ANGIOGRAM;  Surgeon: Lesleigh Noe, MD;  Location: [facility] CATH LAB;  Service: Cardiovascular;;   RIGHT HEART CATHETERIZATION N/A 01/10/2014   Procedure: RIGHT HEART CATH;  Surgeon: Lesleigh Noe, MD;  Location: [facility] CATH LAB;  Service: Cardiovascular;  Laterality: N/A;   TOTAL ABDOMINAL HYSTERECTOMY  1999   TRACHEOSTOMY TUBE PLACEMENT N/A 09/01/2017   Procedure: TRACHEOSTOMY;  Surgeon: Drema Halon, MD;  Location: [facility] OR;  Service: ENT;  Laterality: N/A;    Allergies: Citalopram hydrobromide, Codeine, Hydromorphone hcl, Metoclopramide, and Hyoscyamine  Medications: Prior to Admission medications   Medication Sig Start Date End Date Taking? Authorizing Provider  acetaminophen (TYLENOL) 500 MG tablet Take 500 mg by mouth as needed for moderate pain or headache.    Yes [provider]  amiodarone (PACERONE) 200 MG tablet TAKE 1 TABLET BY MOUTH EVERY MORNING Patient taking differently: Take 200 mg by mouth every morning. 03/03/20  Yes Strader, [place] M, PA-C  apixaban (ELIQUIS) 5 MG TABS tablet Take 1 tablet (5 mg total) by mouth 2 (two) times daily. 02/14/20  Yes BranchDorothe Pea, MD  atorvastatin (LIPITOR) 80 MG tablet Place 1 tablet (80 mg total) into feeding tube daily. Patient taking differently: Take 80 mg by mouth daily. 09/22/20  Yes Russella Dar, NP  Cholecalciferol (VITAMIN D3) 2000 units capsule Take 2,000 Units by mouth daily. 10/25/17   Yes [provider]  DULoxetine (CYMBALTA) 30 MG capsule Take 30 mg by mouth 2 (two) times daily. 11/24/17  Yes [provider]  feeding supplement (ENSURE ENLIVE / ENSURE PLUS) LIQD Take 237 mLs by mouth 3 (three) times daily between meals. 09/21/20  Yes Russella Dar, NP  furosemide (LASIX) 20 MG tablet Take 1 tablet (20 mg total) by mouth daily. 09/22/20  Yes Russella Dar, NP  gabapentin (NEURONTIN) 100 MG capsule Take 1 capsule (100 mg total) by mouth 3 (three) times daily. 09/21/20  Yes Russella Dar, NP  insulin glargine (LANTUS) 100 UNIT/ML injection Inject 0.2 mLs (20 Units total) into the skin daily. 12/30/20  Yes TatOnalee Hua, MD  Lactase 9000 units CHEW Chew 1 tablet (9,000 Units total) by mouth every 4 (four) hours as needed (when eating dairy products). 12/30/20  Yes Tat, Onalee Hua, MD  levETIRAcetam (KEPPRA) 500 MG tablet Take 2 tablets (1,000 mg total) by mouth daily. 12/30/20  Yes Tat, Onalee Hua, MD  levothyroxine (SYNTHROID) 175 MCG tablet Take 1 tablet by  mouth daily. 05/20/19  Yes [provider]  loperamide (IMODIUM) 2 MG capsule 1 PO Q4H PRN LOOSE STOOLS Patient taking differently: Take 2 mg by mouth every 4 (four) hours as needed for diarrhea or loose stools. 09/05/19  Yes Fields, Sandi L, MD  LORazepam (ATIVAN) 0.5 MG tablet Take 0.5 mg by mouth every 8 (eight) hours as needed for anxiety or sedation.   Yes [provider]  meclizine (ANTIVERT) 25 MG tablet Take 1 tablet (25 mg total) by mouth every 8 (eight) hours as needed for dizziness. 10/16/20  Yes BranchDorothe Pea, MD  Multiple Vitamin (MULTIVITAMIN WITH MINERALS) TABS tablet Take 1 tablet by mouth daily.   Yes [provider]  OXYGEN 2 L daily.    Yes [provider]  pantoprazole (PROTONIX) 40 MG tablet TAKE ONE TABLET BY MOUTH TWICE DAILY BEFORE APPLY MEAL Patient taking differently: Take 40 mg by mouth 2 (two) times daily before a meal. 02/07/17  Yes Gelene Mink, NP   potassium chloride (KLOR-CON) 10 MEQ tablet Take 20 mEq by mouth daily.   Yes [provider]  SM MELATONIN 3 MG TABS tablet Take 3 mg by mouth at bedtime as needed for sleep. 08/22/20  Yes [provider]  ondansetron (ZOFRAN) 4 MG tablet TAKE 1 TABLET BY MOUTH FOUR TIMES DAILY AS NEEDED FOR NAUSEA Patient taking differently: Take 4 mg by mouth 4 (four) times daily as needed for nausea. 01/10/18   Gelene Mink, NP     Family History  Problem Relation Age of Onset   Hypertension Mother    Alzheimer's disease Mother    Stroke Mother    Heart attack Mother    Hypertension Other    Breast cancer Sister    Heart disease Neg Hx    Colon cancer Neg Hx     Social History   Socioeconomic History   Marital status: Married    Spouse name: Not on file   Number of children: Not on file   Years of education: Not on file   Highest education level: Not on file  Occupational History   Occupation: Passenger transport manager at [employer]    Employer:     Comment: disabled    Employer: RETIRED  Tobacco Use   Smoking status: Former    Packs/day: 0.25    Years: 15.00    Pack years: 3.75    Types: Cigarettes    Start date: 02/26/1966    Quit date: 07/01/1983    Years since quitting: 37.5   Smokeless tobacco: Never   Tobacco comments:    quit in 1984  Vaping Use   Vaping Use: Never used  Substance and Sexual Activity   Alcohol use: No    Alcohol/week: 0.0 standard drinks   Drug use: No   Sexual activity: Yes    Birth control/protection: None, Surgical  Other Topics Concern   Not on file  Social History Narrative   Sedentary   4 children, "blended family"   Social Determinants of Health   Financial Resource Strain: Not on file  Food Insecurity: Not on file  Transportation Needs: Not on file  Physical Activity: Not on file  Stress: Not on file  Social Connections: Not on file    Review of Systems  Unable to perform ROS: Other  Trach/vent  Vital Signs: BP 138/82    Pulse 81   Temp 98.8 F (37.1 C) (Oral)   Resp 18   Ht 5\' 8"  (1.727  m)   Wt 103.6 kg   SpO2 100%   BMI 34.73 kg/m   Physical Exam Vitals reviewed.  Constitutional:      Appearance: She is obese. She is ill-appearing.  HENT:     Head: Normocephalic and atraumatic.  Cardiovascular:     Rate and Rhythm: Normal rate.  Pulmonary:     Comments: Trach/vent Abdominal:     Palpations: Abdomen is soft.     Comments: No scars  Skin:    General: Skin is warm and dry.  Neurological:     General: No focal deficit present.    Imaging: CT ABDOMEN PELVIS WO CONTRAST  Result Date: 01/06/2021 CLINICAL DATA:  70 year old female with clinical suspicion of potential retroperitoneal hemorrhage. EXAM: CT ABDOMEN AND PELVIS WITHOUT CONTRAST TECHNIQUE: Multidetector CT imaging of the abdomen and pelvis was performed following the standard protocol without IV contrast. COMPARISON:  CT the abdomen and pelvis 08/23/2020. FINDINGS: Lower chest: Small left and trace right pleural effusions. Patchy areas of atelectasis and consolidation in the lung bases bilaterally (left greater than right). Cardiomegaly. Atherosclerotic calcifications in the thoracic aorta. Moderate to severe calcifications of the mitral annulus. Central venous catheter with tip terminating in the right atrium. Nasogastric tube extending into the antral pre-pyloric region of the stomach. Hepatobiliary: No definite cystic or solid hepatic lesions are confidently identified on today's noncontrast CT examination. Liver has a slightly nodular contour, suggesting underlying cirrhosis. Status post cholecystectomy. Pancreas: No definite pancreatic mass noted on today's noncontrast CT examination. Subtle inflammatory changes surrounding the pancreas which could reflect underlying acute pancreatitis. No well-defined peripancreatic fluid collection to suggest pancreatic pseudocyst at this time. Spleen: Unremarkable. Adrenals/Urinary Tract: Unenhanced  appearance of the kidneys and bilateral adrenal glands is normal. No hydroureteronephrosis. Urinary bladder is completely decompressed around an indwelling Foley balloon catheter, but otherwise unremarkable in appearance. Stomach/Bowel: Nasogastric tube terminating in the antral pre-pyloric region of the stomach. Stomach is otherwise unremarkable in appearance. No pathologic dilatation of small bowel or colon. A few scattered colonic diverticulae are noted without definitive focal surrounding inflammatory changes to clearly indicate an acute diverticulitis at this time (assessment is limited by small volume of ascites). Normal appendix. Vascular/Lymphatic: Aortic atherosclerosis. No lymphadenopathy noted in the abdomen or pelvis. Reproductive: Status post hysterectomy. Ovaries are not confidently identified may be surgically absent or atrophic. Other: No high attenuation fluid collection noted in the peritoneal cavity or retroperitoneal region to suggest occult hemorrhage. There is a small volume of ascites most notably in the low anatomic pelvis and left pericolic gutter. No pneumoperitoneum. Small umbilical hernia containing only omental fat. Musculoskeletal: Mild diffuse body wall edema. There are no aggressive appearing lytic or blastic lesions noted in the visualized portions of the skeleton. Chronic T12 compression fracture with 30% loss of anterior vertebral body height and post vertebroplasty changes, similar to the prior study. IMPRESSION: 1. Study is negative for retroperitoneal hemorrhage. 2. There are subtle inflammatory changes surrounding the pancreas concerning for potential early acute pancreatitis. Correlation with lipase levels is recommended. 3. Small volume of ascites. 4. Morphologic changes in the liver suggestive of underlying cirrhosis. 5. Mild diffuse body wall edema. 6. Small left and trace right pleural effusions. 7. Patchy areas of atelectasis and/or consolidation in the lung bases  bilaterally (left greater than right). 8. Colonic diverticulosis without definitive evidence to suggest an acute diverticulitis at this time. 9. Cardiomegaly. 10. Aortic atherosclerosis. 11. Additional incidental findings, as above. Electronically Signed   By: Brayton Mars.D.  On: 01/06/2021 07:18   CT HEAD WO CONTRAST  Result Date: 01/03/2021 CLINICAL DATA:  Altered mental status EXAM: CT HEAD WITHOUT CONTRAST TECHNIQUE: Contiguous axial images were obtained from the base of the skull through the vertex without intravenous contrast. COMPARISON:  MRI brain dated 11/23/2020 FINDINGS: Brain: No evidence of acute infarction, hemorrhage, hydrocephalus, extra-axial collection or mass lesion/mass effect. Mild cortical and central atrophy. Subcortical white matter and periventricular small vessel ischemic changes. Vascular: No hyperdense vessel or unexpected calcification. Skull: Normal. Negative for fracture or focal lesion. Sinuses/Orbits: The visualized paranasal sinuses are essentially clear. The mastoid air cells are unopacified. Other: None. IMPRESSION: No evidence of acute intracranial abnormality. Mild atrophy with small vessel ischemic changes. Electronically Signed   By: Charline BillsSriyesh  Krishnan M.D.   On: 01/03/2021 23:24   CT CHEST WO CONTRAST  Result Date: 01/03/2021 CLINICAL DATA:  Respiratory failure/hypoxia, intubated EXAM: CT CHEST WITHOUT CONTRAST TECHNIQUE: Multidetector CT imaging of the chest was performed following the standard protocol without IV contrast. COMPARISON:  Chest radiograph dated 01/03/2021. CT chest dated 11/22/2020 FINDINGS: Cardiovascular: Cardiomegaly. No pericardial effusion. Mitral valve annular calcifications. No evidence of thoracic aortic aneurysm. Atherosclerotic calcifications of the aortic arch. Hypodense blood pool relative to myocardium, suggesting anemia. Mediastinum/Nodes: No suspicious mediastinal lymphadenopathy. Visualized thyroid is unremarkable. Lungs/Pleura:  Endotracheal tube terminates 3 cm above the carina. Progressive linear/patchy opacities in the bilateral upper lobes (for example, series 8/image 38). Additional linear/patchy opacities in the mid/lower lungs are chronic, suggesting scarring (for example, series 8/image 72 in the right middle lobe). However, there is progressive patchy opacities in the lingula and bilateral lower lobes, possibly atelectasis, although pneumonia is not excluded. No frank interstitial edema. Small bilateral pleural effusions, mildly progressive on the left and new on the right. No suspicious pulmonary nodules, noting motion degradation. No pneumothorax. Upper Abdomen: Enteric tube coursing into the gastric antrum. Cholecystectomy clips. Vascular calcifications. Musculoskeletal: Degenerative changes of the visualized thoracolumbar spine. Vertebral augmentation at T12. IMPRESSION: Multifocal patchy opacities, some of which may reflect atelectasis/scarring, although lingular and lower lobe pneumonia (left greater than right) is not excluded. Small bilateral pleural effusions, mildly progressive on the left and new on the right. No frank interstitial edema. Endotracheal tube terminates 3 cm above the carina. Enteric tube courses into the distal gastric antrum. Aortic Atherosclerosis (ICD10-I70.0). Electronically Signed   By: Charline BillsSriyesh  Krishnan M.D.   On: 01/03/2021 23:35   DG Chest Port 1 View  Result Date: 01/10/2021 CLINICAL DATA:  Respiratory dependent.  Tracheostomy. EXAM: PORTABLE CHEST 1 VIEW COMPARISON:  Chest x-rays dated 01/07/2021 and 01/05/2021. FINDINGS: Tracheostomy tube appears appropriately positioned in the midline. Enteric tubes passes below the diaphragm. RIGHT-sided PICC line appears well positioned. Stable cardiomegaly. Stable LEFT basilar opacity, better demonstrated on chest CT of 01/03/2021, atelectasis versus pneumonia. RIGHT lung is clear. No pneumothorax is seen. IMPRESSION: 1. Tubes and lines are stable in  position. 2. Stable LEFT basilar opacity, atelectasis versus pneumonia, similar in extent to the findings on earlier chest CT of 01/03/2021. Electronically Signed   By: Bary RichardStan  Maynard M.D.   On: 01/10/2021 09:03   DG Chest Port 1 View  Result Date: 01/07/2021 CLINICAL DATA:  Status post tracheostomy. EXAM: PORTABLE CHEST 1 VIEW COMPARISON:  January 05, 2021. FINDINGS: Stable cardiomegaly. Tracheostomy tube is in grossly good position. Nasogastric tube is seen entering stomach. No pneumothorax is noted. Significantly increased left lung opacity is noted concerning for pneumonia or edema with probable associated effusion. Bony thorax is unremarkable. IMPRESSION:  Tracheostomy tube in grossly good position. Increased left lung opacity as described above. Electronically Signed   By: Lupita Raider M.D.   On: 01/07/2021 11:20   DG Chest Port 1 View  Result Date: 01/05/2021 CLINICAL DATA:  Acute respiratory failure. EXAM: PORTABLE CHEST 1 VIEW COMPARISON:  CT 01/03/2021.  Chest x-ray 01/03/2021. FINDINGS: Endotracheal tube and NG tube in stable position. Right PICC line stable position. Mitral annular calcification again noted. Cardiomegaly again noted. No pulmonary venous congestion. Persistent bibasilar atelectasis/infiltrates, left side greater than right. Interval partial clearing of bilateral upper lobe atelectasis/infiltrates. Small left pleural effusion cannot be excluded. No pneumothorax. Prior lower thoracic vertebroplasty. IMPRESSION: 1.  Lines and tubes in stable position. 2.  Cardiomegaly again noted.  No pulmonary venous congestion. 3. Persistent bibasilar atelectasis/infiltrates, left side greater than right again noted. Interval partial clearing of bilateral upper lobe atelectasis/infiltrates. Small left pleural effusion cannot be excluded. Electronically Signed   By: Maisie Fus  Register   On: 01/05/2021 05:40   DG Chest Port 1 View  Result Date: 01/04/2021 CLINICAL DATA:  PICC line placement. EXAM:  PORTABLE CHEST 1 VIEW COMPARISON:  January 03, 2021. FINDINGS: Stable cardiomegaly. Endotracheal and nasogastric tubes are unchanged in position. Interval placement of right-sided PICC line with distal tip in expected position of right atrium. No pneumothorax is noted. Mild bibasilar subsegmental atelectasis is noted. Bony thorax is unremarkable. IMPRESSION: Endotracheal and nasogastric tubes are in good position. Interval placement of right-sided PICC line with distal tip in expected position of right atrium; withdrawal by 2-3 cm is recommended. Mild bibasilar subsegmental atelectasis is noted. Electronically Signed   By: Lupita Raider M.D.   On: 01/04/2021 16:26   DG Chest Port 1 View  Result Date: 01/03/2021 CLINICAL DATA:  Post intubation. EXAM: PORTABLE CHEST 1 VIEW COMPARISON:  01/03/2021 FINDINGS: An endotracheal tube has been placed with tip measuring 3.4 cm above the carina. An enteric tube has been placed. Tip is off the field of view but below the left hemidiaphragm. Cardiac enlargement. Linear atelectasis or consolidation in the mid and lower lungs. Probable left pleural effusion. No pneumothorax. Calcified aorta. Prominence of the left pulmonary outflow tract. IMPRESSION: Appliances appear in satisfactory position. Persistent finding of cardiac enlargement. Atelectasis in the lung bases. Left pleural effusion. Electronically Signed   By: Burman Nieves M.D.   On: 01/03/2021 19:04   DG Chest Port 1 View  Result Date: 01/03/2021 CLINICAL DATA:  Shortness of breath.  Hypoxia. EXAM: PORTABLE CHEST 1 VIEW COMPARISON:  Most recent radiograph 12/25/2008. Most recent CT 11/22/2020 FINDINGS: Marked chronic cardiomegaly. Retrocardiac opacity likely combination of pleural effusion and atelectasis, similar to prior exam. Mild scarring in the right mid lung and right lung base. No new airspace disease. No pneumothorax. Bones under mineralized. Previous right upper extremity PICC has been removed. IMPRESSION:  Chronic cardiomegaly. Retrocardiac opacity likely combination of pleural effusion and atelectasis, similar to prior exam 10 days ago. Scattered scarring/atelectasis in the right lung. Electronically Signed   By: Narda Rutherford M.D.   On: 01/03/2021 16:20   DG Chest Port 1 View  Result Date: 12/25/2020 CLINICAL DATA:  Dyspnea. EXAM: PORTABLE CHEST 1 VIEW COMPARISON:  December 22, 2020. FINDINGS: Limited evaluation due to portable technique with patient rotation. Enlarged cardiac silhouette. Right upper extremity approach PICC catheter with the tip projecting in the expected region of the SVC. Calcific atherosclerosis. Pulmonary vascular congestion. Left greater than right bibasilar opacities. No visible pneumothorax on this limited AP supine  radiograph. No visible right pleural effusion. Limited evaluation for left pleural effusion due to patient rotation, left basilar opacities, and the left costophrenic sulcus being excluded from the field of view. Polyarticular degenerative change. IMPRESSION: 1. Limited evaluation due to portable technique with patient rotation. 2. Cardiomegaly and central pulmonary vascular congestion. 3. Left greater than right bibasilar opacities, which could represent atelectasis, aspiration, and/or pneumonia. Electronically Signed   By: Feliberto Harts MD   On: 12/25/2020 11:48   DG CHEST PORT 1 VIEW  Result Date: 12/22/2020 CLINICAL DATA:  Status post PICC line placement EXAM: PORTABLE CHEST 1 VIEW COMPARISON:  12/21/2020 FINDINGS: Cardiac shadow is enlarged but stable. Aortic calcifications are again seen. Endotracheal tube and gastric catheter are again noted and stable. New right-sided PICC line is seen with the catheter tip in the distal superior vena cava. Patchy airspace opacities are again identified bilaterally stable from the prior exam. IMPRESSION: PICC line in satisfactory position. Remainder of the exam is stable. Electronically Signed   By: Alcide Clever M.D.   On:  12/22/2020 16:20   Portable Chest x-ray  Result Date: 12/21/2020 CLINICAL DATA:  Intubation.  Short of breath. EXAM: PORTABLE CHEST 1 VIEW COMPARISON:  12/21/2020 at 10 a.m. FINDINGS: Endotracheal tube tip projects 2.4 cm above the carina. Nasal/orogastric tube passes well below the diaphragm, curling within the stomach, tip below the included field of view. Cardiac silhouette is enlarged. There is persistent vascular congestion with bilateral interstitial opacities. No new lung abnormalities. IMPRESSION: 1. Well-positioned endotracheal tube and nasal/orogastric tube. 2. No change in the appearance of the lungs or cardiomegaly. Suspect congestive heart failure. Electronically Signed   By: Amie Portland M.D.   On: 12/21/2020 16:20   DG Chest Port 1 View  Result Date: 12/21/2020 CLINICAL DATA:  Cough, shortness of breath EXAM: PORTABLE CHEST 1 VIEW COMPARISON:  11/21/2020 FINDINGS: Bilateral diffuse interstitial thickening. No focal consolidation. No pleural effusion or pneumothorax. Stable cardiomegaly. No acute osseous abnormality. IMPRESSION: Cardiomegaly with mild pulmonary vascular congestion. Electronically Signed   By: Elige Ko   On: 12/21/2020 10:33   DG Abd Portable 1V  Result Date: 01/09/2021 CLINICAL DATA:  Check gastric catheter placement EXAM: PORTABLE ABDOMEN - 1 VIEW COMPARISON:  01/08/2021 FINDINGS: Feeding catheter is again noted in the distal stomach. New gastric catheter is noted alongside the feeding catheter extending into the distal stomach. No free air is seen. IMPRESSION: Feeding catheter and nasogastric catheter in the distal stomach as described. Electronically Signed   By: Alcide Clever M.D.   On: 01/09/2021 20:48   DG Abd Portable 1V  Result Date: 01/08/2021 CLINICAL DATA:  Nasogastric tube placement EXAM: PORTABLE ABDOMEN - 1 VIEW COMPARISON:  Portable exam 1138 hours compared to 09/01/2017 FINDINGS: Tip of feeding tube projects over distal gastric antrum/pyloric region.  Nonobstructive bowel gas pattern. Bones demineralized with prior spinal augmentation procedure of T12. Surgical clips RIGHT upper quadrant question cholecystectomy. IMPRESSION: Tip of feeding tube projects over distal gastric antrum/pylorus. Electronically Signed   By: Ulyses Southward M.D.   On: 01/08/2021 12:07   ECHOCARDIOGRAM COMPLETE  Result Date: 01/04/2021    ECHOCARDIOGRAM REPORT   Patient Name:   [name] Date of Exam: 01/04/2021 Medical Rec #:  [id number]     Height:       68.0 in Accession #:    [id number]    Weight:       237.2 lb Date of Birth:  06-Jul-1950     BSA:  2.197 m Patient Age:    69 years      BP:           133/46 mmHg Patient Gender: F             HR:           75 bpm. Exam Location:  Inpatient Procedure: 2D Echo, 3D Echo, Cardiac Doppler, Color Doppler and Strain Analysis STAT ECHO Indications:    CHF-Acute Diastolic I50.31  History:        Patient has prior history of Echocardiogram examinations, most                 recent 11/22/2020. CAD and Previous Myocardial Infarction,                 Pulmonary HTN and Stroke, Arrythmias:Atrial Fibrillation,                 Signs/Symptoms:Dementia; Risk Factors:Diabetes, Hypertension,                 Dyslipidemia and Sleep Apnea. Acute metabolic encephalopathy,                 Chronic kidney disease, anemia. GERD.  Sonographer:    Leta Jungling RDCS Referring Phys: [id number] NICOLE GONZALES IMPRESSIONS  1. Left ventricular ejection fraction, by estimation, is 45 to 50%. Left ventricular ejection fraction by PLAX is 46 %. The left ventricle has mildly decreased function. The left ventricle demonstrates global hypokinesis. There is moderate concentric left ventricular hypertrophy. Left ventricular diastolic parameters are indeterminate.  2. Right ventricular systolic function is moderately reduced. The right ventricular size is normal. There is severely elevated pulmonary artery systolic pressure. The estimated right ventricular systolic pressure  is 60.1 mmHg.  3. Tricuspid valve regurgitation is at least moderate.  4. The inferior vena cava is normal in size with <50% respiratory variability, suggesting right atrial pressure of 8 mmHg.  5. The mitral valve is abnormal. Mild to moderate mitral valve regurgitation, with blunting of systolic pulmonary vein flow. No evidence of mitral stenosis. Severe mitral annular calcification.  6. Left atrial size was severely dilated.  7. The aortic valve is tricuspid. There is mild calcification of the aortic valve. Aortic valve regurgitation is not visualized. No aortic stenosis is present. Comparison(s): A prior study was performed on 11/22/20. Prior images reviewed side by side. LV function appears slightly less vigorous with similar tricuspid regurgitation. FINDINGS  Left Ventricle: Left ventricular ejection fraction, by estimation, is 45 to 50%. Left ventricular ejection fraction by PLAX is 46 %. The left ventricle has mildly decreased function. The left ventricle demonstrates global hypokinesis. 3D left ventricular ejection fraction analysis performed but not reported based on interpreter judgement due to suboptimal quality. The left ventricular internal cavity size was normal in size. There is moderate concentric left ventricular hypertrophy. Left ventricular diastolic parameters are indeterminate. Right Ventricle: The right ventricular size is normal. No increase in right ventricular wall thickness. Right ventricular systolic function is moderately reduced. There is severely elevated pulmonary artery systolic pressure. The tricuspid regurgitant velocity is 3.61 m/s, and with an assumed right atrial pressure of 8 mmHg, the estimated right ventricular systolic pressure is 60.1 mmHg. Left Atrium: Left atrial size was severely dilated. Right Atrium: Right atrial size was not well visualized. Pericardium: There is no evidence of pericardial effusion. Mitral Valve: The mitral valve is abnormal. Severe mitral annular  calcification. Mild to moderate mitral valve regurgitation. No evidence of mitral valve stenosis. The  mean mitral valve gradient is 2.0 mmHg with average heart rate of 123 bpm. Tricuspid Valve: The tricuspid valve is abnormal. Tricuspid valve regurgitation is moderate . No evidence of tricuspid stenosis. Aortic Valve: The aortic valve is tricuspid. There is mild calcification of the aortic valve. Aortic valve regurgitation is not visualized. No aortic stenosis is present. Pulmonic Valve: The pulmonic valve was normal in structure. Pulmonic valve regurgitation is mild to moderate. No evidence of pulmonic stenosis. Aorta: The aortic root and ascending aorta are structurally normal, with no evidence of dilitation. Venous: The inferior vena cava is normal in size with less than 50% respiratory variability, suggesting right atrial pressure of 8 mmHg. IAS/Shunts: The atrial septum is grossly normal.  LEFT VENTRICLE PLAX 2D LV EF:         Left            Diastology                ventricular     LV e' medial:    5.42 cm/s                ejection        LV E/e' medial:  21.8                fraction by     LV e' lateral:   8.72 cm/s                PLAX is 46      LV E/e' lateral: 13.5                %. LVIDd:         4.80 cm LVIDs:         3.70 cm LV PW:         1.20 cm LV IVS:        1.50 cm LVOT diam:     2.00 cm LV SV:         63 LV SV Index:   29 LVOT Area:     3.14 cm  RIGHT VENTRICLE RV S prime:     6.50 cm/s TAPSE (M-mode): 1.3 cm LEFT ATRIUM            Index       RIGHT ATRIUM           Index LA diam:      5.00 cm  2.28 cm/m  RA Area:     24.80 cm LA Vol (A2C): 81.2 ml  36.95 ml/m RA Volume:   62.80 ml  28.58 ml/m LA Vol (A4C): 124.0 ml 56.43 ml/m  AORTIC VALVE LVOT Vmax:   101.45 cm/s LVOT Vmean:  63.600 cm/s LVOT VTI:    0.201 m  AORTA Ao Root diam: 2.70 cm Ao Asc diam:  3.10 cm MITRAL VALVE                TRICUSPID VALVE MV Area (PHT): 4.40 cm     TR Peak grad:   52.1 mmHg MV Mean grad:  2.0 mmHg     TR  Vmax:        361.00 cm/s MV Decel Time: 173 msec MR Peak grad: 70.6 mmHg     SHUNTS MR Mean grad: 46.0 mmHg     Systemic VTI:  0.20 m MR Vmax:      420.00 cm/s   Systemic Diam: 2.00 cm MR Vmean:     315.0 cm/s MV E velocity: 118.00 cm/s [employer]  MD Electronically signed by Riley Lam MD Signature Date/Time: 01/04/2021/8:38:33 AM    Final    [place] EKG SITE RITE  Result Date: 01/04/2021 If Site Rite image not attached, placement could not be confirmed due to current cardiac rhythm.  [place] EKG SITE RITE  Result Date: 12/22/2020 If Site Rite image not attached, placement could not be confirmed due to current cardiac rhythm.   Labs:  CBC: Recent Labs    01/07/21 0531 01/08/21 0500 01/09/21 0436 01/10/21 0316  WBC 10.0 14.2* 14.2* 12.5*  HGB 7.3* 8.4* 7.5* 7.0*  HCT 22.5* 26.2* 23.9* 21.9*  PLT 140* 183 167 158    COAGS: Recent Labs    08/23/20 2305 08/29/20 0058 11/22/20 0403 01/04/21 0358 01/04/21 1216 01/06/21 0443 01/07/21 0531 01/08/21 0500 01/09/21 0436  INR 1.8*  --  1.5* 2.1*  --   --   --   --   --   APTT 38*   < > 37* 71*   < > 95* 99* 86* 74*   < > = values in this interval not displayed.    BMP: Recent Labs    01/07/21 0531 01/08/21 0854 01/09/21 0436 01/10/21 0316  NA 138 136 137 139  K 3.1* 3.8 3.4* 3.2*  CL 106 106 106 108  CO2 23 24 24 27   GLUCOSE 181* 193* 237* 253*  BUN 23 26* 28* 26*  CALCIUM 7.7* 8.1* 8.1* 8.3*  CREATININE 1.29* 1.54* 1.37* 1.19*  GFRNONAA 44* 36* 41* 49*    LIVER FUNCTION TESTS: Recent Labs    12/27/20 0449 12/28/20 0430 12/29/20 0442 01/03/21 1255  BILITOT 0.5 0.5 0.7 0.4  AST 26 29 23 24   ALT 11 9 7 9   ALKPHOS 56 75 86 89  PROT 5.6* 5.4* 5.7* 6.4*  ALBUMIN 2.9* 2.8* 2.9* 3.2*    TUMOR MARKERS: No results for input(s): AFPTM, CEA, CA199, CHROMGRNA in the last 8760 hours.  Assessment and Plan:  Acute on chronic hypoxic/hypercapnic respiratory failure from acute pulmonary edema, pneumonia, and  restrictive lung disease from kyphoscolosis  Failure to wean from vent, s/p tracheostomy on 01/07/21.  Images reviewed by Dr. Elby Showers.  Will proceed with image guided placement of a gastrostomy tube tomorrow as IR schedule allows.  Risks and benefits image guided gastrostomy tube placement was discussed with the patient's daughter including, but not limited to the need for a barium enema during the procedure, bleeding, infection, peritonitis and/or damage to adjacent structures.  All questions were answered, patient is agreeable to proceed.  Consent obtained from daughter.  Thank you for this interesting consult.  I greatly enjoyed meeting [name] and look forward to participating in their care.  A copy of this report was sent to the requesting provider on this date.  Electronically Signed: Gwynneth Macleod, PA-C   01/10/2021, 11:48 AM      I spent a total of 20 Minutes in face to face in clinical consultation, greater than 50% of which was counseling/coordinating care for gastrostomy tube placement.

## 2021-01-10 NOTE — Progress Notes (Signed)
eLink Physician-Brief Progress Note Patient Name: [name] DOB: 1950/12/15 MRN: [id number]   Date of Service  01/10/2021  HPI/Events of Note  CBGs in 200s steadily. She is on moderate intensity insulin sliding scale.  Patient is receiving hydrocortisone 50mg  IV Q6H as well as D5W @ 40 mL/hr. She is NPO (tube feeds stopped in advance of PEG in AM).   eICU Interventions  Stop D5W as the patient is hyperglycemic on this fluid.      Intervention Category Intermediate Interventions: Hyperglycemia - evaluation and treatment  01/10/2021, 11:56 PM

## 2021-01-11 ENCOUNTER — Inpatient Hospital Stay (HOSPITAL_COMMUNITY): Payer: PPO

## 2021-01-11 DIAGNOSIS — J9602 Acute respiratory failure with hypercapnia: Secondary | ICD-10-CM | POA: Diagnosis not present

## 2021-01-11 HISTORY — PX: IR GASTROSTOMY TUBE MOD SED: IMG625

## 2021-01-11 LAB — GLUCOSE, CAPILLARY
Glucose-Capillary: 202 mg/dL — ABNORMAL HIGH (ref 70–99)
Glucose-Capillary: 203 mg/dL — ABNORMAL HIGH (ref 70–99)
Glucose-Capillary: 223 mg/dL — ABNORMAL HIGH (ref 70–99)
Glucose-Capillary: 224 mg/dL — ABNORMAL HIGH (ref 70–99)
Glucose-Capillary: 227 mg/dL — ABNORMAL HIGH (ref 70–99)
Glucose-Capillary: 229 mg/dL — ABNORMAL HIGH (ref 70–99)

## 2021-01-11 LAB — CBC
HCT: 24.6 % — ABNORMAL LOW (ref 36.0–46.0)
Hemoglobin: 7.6 g/dL — ABNORMAL LOW (ref 12.0–15.0)
MCH: 28 pg (ref 26.0–34.0)
MCHC: 30.9 g/dL (ref 30.0–36.0)
MCV: 90.8 fL (ref 80.0–100.0)
Platelets: 202 10*3/uL (ref 150–400)
RBC: 2.71 MIL/uL — ABNORMAL LOW (ref 3.87–5.11)
RDW: 19 % — ABNORMAL HIGH (ref 11.5–15.5)
WBC: 11.8 10*3/uL — ABNORMAL HIGH (ref 4.0–10.5)
nRBC: 0 % (ref 0.0–0.2)

## 2021-01-11 LAB — BASIC METABOLIC PANEL
Anion gap: 7 (ref 5–15)
BUN: 29 mg/dL — ABNORMAL HIGH (ref 8–23)
CO2: 24 mmol/L (ref 22–32)
Calcium: 8.7 mg/dL — ABNORMAL LOW (ref 8.9–10.3)
Chloride: 103 mmol/L (ref 98–111)
Creatinine, Ser: 1.15 mg/dL — ABNORMAL HIGH (ref 0.44–1.00)
GFR, Estimated: 51 mL/min — ABNORMAL LOW (ref >60)
Glucose, Bld: 243 mg/dL — ABNORMAL HIGH (ref 70–99)
Potassium: 3.8 mmol/L (ref 3.5–5.1)
Sodium: 134 mmol/L — ABNORMAL LOW (ref 135–145)

## 2021-01-11 LAB — PROTIME-INR
INR: 1.1 (ref 0.8–1.2)
Prothrombin Time: 14.3 seconds (ref 11.4–15.2)

## 2021-01-11 LAB — HEPARIN LEVEL (UNFRACTIONATED): Heparin Unfractionated: 0.33 IU/mL (ref 0.30–0.70)

## 2021-01-11 MED ORDER — HEPARIN (PORCINE) 25000 UT/250ML-% IV SOLN
1250.0000 [IU]/h | INTRAVENOUS | Status: DC
  Administered 2021-01-11: 950 [IU]/h via INTRAVENOUS
  Administered 2021-01-12: 1050 [IU]/h via INTRAVENOUS
  Filled 2021-01-11 (×2): qty 250

## 2021-01-11 MED ORDER — CEFAZOLIN SODIUM-DEXTROSE 2-4 GM/100ML-% IV SOLN
INTRAVENOUS | Status: AC
  Filled 2021-01-11: qty 100

## 2021-01-11 MED ORDER — MIDAZOLAM HCL 2 MG/2ML IJ SOLN
INTRAMUSCULAR | Status: AC | PRN
  Administered 2021-01-11: 0.5 mg via INTRAVENOUS

## 2021-01-11 MED ORDER — CEFAZOLIN SODIUM-DEXTROSE 2-4 GM/100ML-% IV SOLN
2.0000 g | Freq: Once | INTRAVENOUS | Status: AC
  Administered 2021-01-11: 2 g via INTRAVENOUS
  Filled 2021-01-11: qty 100

## 2021-01-11 MED ORDER — LIDOCAINE HCL (PF) 1 % IJ SOLN
INTRAMUSCULAR | Status: AC | PRN
  Administered 2021-01-11: 30 mL

## 2021-01-11 MED ORDER — IOHEXOL 300 MG/ML  SOLN
50.0000 mL | Freq: Once | INTRAMUSCULAR | Status: AC | PRN
  Administered 2021-01-11: 20 mL

## 2021-01-11 MED ORDER — MIDAZOLAM HCL 2 MG/2ML IJ SOLN
INTRAMUSCULAR | Status: AC
  Filled 2021-01-11: qty 2

## 2021-01-11 MED ORDER — HYDROCORTISONE NA SUCCINATE PF 100 MG IJ SOLR
50.0000 mg | Freq: Two times a day (BID) | INTRAMUSCULAR | Status: DC
  Administered 2021-01-11 – 2021-01-13 (×4): 50 mg via INTRAVENOUS
  Filled 2021-01-11 (×4): qty 2

## 2021-01-11 MED ORDER — BACITRACIN-NEOMYCIN-POLYMYXIN 400-5-5000 EX OINT
1.0000 "application " | TOPICAL_OINTMENT | Freq: Every day | CUTANEOUS | Status: DC
  Administered 2021-01-11 – 2021-01-13 (×3): 1 via TOPICAL
  Filled 2021-01-11 (×2): qty 1

## 2021-01-11 MED ORDER — FENTANYL CITRATE (PF) 100 MCG/2ML IJ SOLN
INTRAMUSCULAR | Status: AC
  Filled 2021-01-11: qty 2

## 2021-01-11 MED ORDER — LIDOCAINE HCL 1 % IJ SOLN
INTRAMUSCULAR | Status: AC
  Filled 2021-01-11: qty 20

## 2021-01-11 MED ORDER — FENTANYL CITRATE (PF) 100 MCG/2ML IJ SOLN
INTRAMUSCULAR | Status: AC | PRN
  Administered 2021-01-11: 25 ug via INTRAVENOUS

## 2021-01-11 NOTE — Sedation Documentation (Signed)
Patient transported back to 46m14. Bedside report given to Autumn RN.

## 2021-01-11 NOTE — Progress Notes (Signed)
ANTICOAGULATION CONSULT NOTE  Pharmacy Consult for IV Heparin Indication: atrial fibrillation  Patient Measurements: Height: 5\' 8"  (172.7 cm) Weight: 105.9 kg (233 lb 7.5 oz) IBW/kg (Calculated) : 63.9 Heparin Dosing Weight: 87.8 kg   Vital Signs: Temp: 98.1 F (36.7 C) (07/11 0740) Temp Source: Axillary (07/11 0740) BP: 121/71 (07/11 0900) Pulse Rate: 58 (07/11 0900)  Labs: Recent Labs    01/09/21 0436 01/10/21 0316 01/11/21 0324 01/11/21 0822  HGB 7.5* 7.0*  --  7.6*  HCT 23.9* 21.9*  --  24.6*  PLT 167 158  --  202  APTT 74*  --   --   --   LABPROT  --   --   --  14.3  INR  --   --   --  1.1  HEPARINUNFRC 0.39 0.36 0.33  --   CREATININE 1.37* 1.19*  --  1.15*    Estimated Creatinine Clearance: 57.2 mL/min (A) (by C-G formula based on SCr of 1.15 mg/dL (H)).  Assessment: 70 yr old female with hx atrial fibrillation, for which was on apixaban PTA (last dose at 01/03/21 at 0800). Pharmacy was consulted to dose IV heparin while apixaban is on hold.   Heparin level of 0.33 is therapeutic on heparin 950 units/hr. Hgb 7.6. Plt wnl. No bleeding noted. Plan for PEG placement today.   Goal of Therapy:  Heparin level 0.3-0.7 units/ml aPTT 66-102 seconds Monitor platelets by anticoagulation protocol: Yes   Plan:  - Continue IV Heparin at 950 units/hr.  - Daily Heparin level and CBC while on therapy - Will continue to monitor for any signs/symptoms of bleeding - Follow up resuming oral anticoagulation after PEG placement   Thank you for allowing pharmacy to be a part of this patient's care.  03/06/21, PharmD Clinical Pharmacist  01/11/2021, 10:35 AM

## 2021-01-11 NOTE — Progress Notes (Signed)
NAME:  [name], MRN:  [id number], DOB:  Mar 14, 1951, LOS: 8 ADMISSION DATE:  01/03/2021, CONSULTATION DATE:  01/03/2021 REFERRING MD:  EDP - [facility] CHIEF COMPLAINT:  SOB, encephalopathy   History of Present Illness:  70 yo female presented to [facility] on 7/03 with dyspnea, and altered mental status.  Transferred to [facility]. SpO2 65% and elevated PCO2.  Failed Bipap and required intubation.  Had admission from 12/21/20 to 12/30/20 for respiratory failure and CHF exacerbation.  Pertinent  Medical History  Kyphoscoliosis, Restrictive lung disease, Chronic respiratory failure on 2 liters, DM type 2, Chronic a fib on eliquis, Dementia, Chronic combined CHF, Hypothyroidism, Seizures, CKD 3a, CAD s/p NSTEMI, HLD, GERD  Significant Hospital Events:  Admitted to ICU on 01/03/21, transfer to [facility]. 7/3 CT LLL PNA.  7/4-7/5 Hypotension overnight. A line placed. LR bolus. Started on NE 7/6 Hgb 6.7 (7.4), CT A/P completed to r/o RP bleed (negative), showed mild peripancreatic stranding. Lipase slightly elevated. 1U PRBCs ordered. 7/7 Tracheostomy placed, heparin gtt held for procedure. 7/8 Increased agitation with Propofol wean, increased HR, uptrending pressor requirements. Cortrak placed.  Antibiotics Ceftriaxone 7/3 x 1 Azithromycin 7/3 >> 7/8 Cefepime 7/4 >> 7/8  Interim History / Subjective:  NAEON, scheduled for G tube placement today  Objective:  Blood pressure 121/71, pulse 62, temperature 98.1 F (36.7 C), temperature source Axillary, resp. rate (!) 26, height 5\' 8"  (1.727 m), weight 105.9 kg, SpO2 100 %.    Vent Mode: PSV;CPAP FiO2 (%):  [30 %] 30 % Set Rate:  [20 bmp] 20 bmp Vt Set:  [510 mL] 510 mL PEEP:  [5 cmH20] 5 cmH20 Pressure Support:  [10 cmH20] 10 cmH20 Plateau Pressure:  [12 cmH20-19 cmH20] 12 cmH20   Intake/Output Summary (Last 24 hours) at 01/11/2021 1116 Last data filed at 01/11/2021 1000 Gross per 24 hour  Intake 1045.57 ml  Output 300 ml  Net 745.57 ml    Filed Weights    01/09/21 0500 01/10/21 0312 01/11/21 0325  Weight: 107.4 kg 103.6 kg 105.9 kg   Physical Examination: General: Acute on chronically ill appearing elderly female resting comfortably in bed on mechanical ventilation through trach, in NAD HEENT: 6 cuffed shiley trach midline, MM pink/moist Neuro: Alert and following commands CV: bradycardic, murmur, rubs, or gallops PULM:  Bilateral wheezing, tolerating vent, no increased work of breathing GI: non-tender, non-distended, soft, + bowel sounds Extremities: warm/dry, no edema  Skin: no rashes or lesions  Resolved Hospital Problem List   Septic shock from pneumonia  Assessment & Plan:   Acute on chronic hypoxic/hypercapnic respiratory failure from acute pulmonary edema, pneumonia. Restrictive lung disease from kyphoscolosis, sleep disordered breathing. Failure to wean from vent s/p tracheostomy on 01/07/21. P: Continue ventilator support with lung protective strategies  Routine trach care  Remove trach sutures 7/14 Wean PEEP and FiO2 for sats greater than 90%. Head of bed elevated 30 degrees. Follow intermittent chest x-ray and ABG.   Ensure adequate pulmonary hygiene  Follow cultures  VAP bundle in place  PAD protocol  Relative adrenal insufficiency. P: Remains on solu corted, started 7/9 Continue Midodrine   Acute metabolic encephalopathy 2nd to sepsis and hypercapnia. Hx of CVA, seizures, dementia, depression. P: Maintain neuro protective measures Nutrition and bowel regiment  Seizure precautions  Continue Keppra and Neurontin  Home Cymbalta remains on hold  Aspirations precautions   Anemia of critical illness and chronic disease. P: Hgb stably low at 7.0 No signs of bleeding or hemodynamic compromise  Trend CBC  Permanent atrial fibrillation. Acute on chronic combined CHF. -ECHO 45-50% with global hypokinesis  HLD. P: Cardiology evaluated during admission, now signed off Continue per tube amiodarone, lasix,  and statin  Continuous telemetry  Continue heparin until PEG placed   CKD 3a. P: Follow renal function  Monitor urine output Trend Bmet Avoid nephrotoxins Ensure adequate renal perfusion   DM type 2 poorly controlled with hyperglycemia. P: Hold any further Lantus until PEG/enteral feeding plan can be established and consistent  Decreased dextrose  Stop tube feed coverage   Hypothyroidism. P: Continue Synthroid   Dysphagia. - plan for PEG with IR today  Left upper extremity swelling and redness  P: Obtain doppler   Best Practice    Diet/type: NPO for PEG tube placement DVT prophylaxis: systemic heparin GI prophylaxis: PPI Lines: yes and it is still needed Foley:  Yes, and it is still needed Code Status:  full code Last date of multidisciplinary goals of care discussion [7/4] Disposition: family agreeable to LTAC placement and social work looking into [facility] Updated pt's daughter at bedside on 01/10/21.   Doristine Devoid, MS4 01/11/2021, 11:16 AM

## 2021-01-11 NOTE — Progress Notes (Signed)
eLink Physician-Brief Progress Note Patient Name: NEHEMIAH MONTEE DOB: March 12, 1951 MRN: [id number]   Date of Service  01/11/2021  HPI/Events of Note  RN requests order to remove NG tube. Patient has both an NG tube (for decompression after patient had emesis recently) and CorTrak in place. Going for PEG (already has trach) tomorrow.   eICU Interventions  OK to remove NG tube.     Intervention Category Minor Interventions: Routine modifications to care plan (e.g. PRN medications for pain, fever)  Marveen Reeks Myliah Medel 01/11/2021, 1:41 AM

## 2021-01-11 NOTE — Procedures (Signed)
Interventional Radiology Procedure Note  Procedure: Placement of percutaneous 20F pull-through gastrostomy tube. Complications: None Recommendations: - NPO except for sips and chips remainder of today and overnight - Maintain G-tube to LWS until tomorrow morning  - May advance diet as tolerated and begin using tube tomorrow morning  Signed,   Aquiles Ruffini S. Sharai Overbay, DO   

## 2021-01-11 NOTE — Progress Notes (Signed)
Inpatient Diabetes Program Recommendations  AACE/ADA: New Consensus Statement on Inpatient Glycemic Control   Target Ranges:  Prepandial:   less than 140 mg/dL      Peak postprandial:   less than 180 mg/dL (1-2 hours)      Critically ill patients:  140 - 180 mg/dL   Results for JOANE, POSTEL (MRN [id number]) as of 01/11/2021 12:31  Ref. Range 01/11/2021 03:30 01/11/2021 07:27 01/11/2021 11:20  Glucose-Capillary Latest Ref Range: 70 - 99 mg/dL 657 (H) 846 (H) 962 (H)  Results for HERMINA, BARNARD (MRN [id number]) as of 01/11/2021 12:31  Ref. Range 01/10/2021 08:05 01/10/2021 12:17 01/10/2021 15:41 01/10/2021 19:43 01/10/2021 23:31  Glucose-Capillary Latest Ref Range: 70 - 99 mg/dL 401 (H) 027 (H) 253 (H) 215 (H) 227 (H)   Review of Glycemic Control  Diabetes history: DM2 Outpatient Diabetes medications: Lantus 20 units daily Current orders for Inpatient glycemic control: Novolog 0-15 units Q4H; Solucortef 50 mg Q12H  Inpatient Diabetes Program Recommendations:    Insulin: Noted Lantus 20 units given on 01/10/21 and no Lantus currently ordered. Noted patient to have PEG tube placed today in IR. CBGs are consistently in 200's. Please consider ordering Lantus 25 units daily (to start today). Once PEG is placed and tube feeding is restarted, may want to consider ordering Novolog 5 units Q4H for tube feeding coverage.  Thanks, Orlando Penner, RN, MSN, CDE Diabetes Coordinator Inpatient Diabetes Program [phone] (Team Pager from 8am to 5pm)

## 2021-01-12 DIAGNOSIS — J9602 Acute respiratory failure with hypercapnia: Secondary | ICD-10-CM | POA: Diagnosis not present

## 2021-01-12 LAB — CBC
HCT: 24 % — ABNORMAL LOW (ref 36.0–46.0)
Hemoglobin: 7.5 g/dL — ABNORMAL LOW (ref 12.0–15.0)
MCH: 28.2 pg (ref 26.0–34.0)
MCHC: 31.3 g/dL (ref 30.0–36.0)
MCV: 90.2 fL (ref 80.0–100.0)
Platelets: 220 10*3/uL (ref 150–400)
RBC: 2.66 MIL/uL — ABNORMAL LOW (ref 3.87–5.11)
RDW: 18.9 % — ABNORMAL HIGH (ref 11.5–15.5)
WBC: 8.3 10*3/uL (ref 4.0–10.5)
nRBC: 0 % (ref 0.0–0.2)

## 2021-01-12 LAB — GLUCOSE, CAPILLARY
Glucose-Capillary: 179 mg/dL — ABNORMAL HIGH (ref 70–99)
Glucose-Capillary: 190 mg/dL — ABNORMAL HIGH (ref 70–99)
Glucose-Capillary: 234 mg/dL — ABNORMAL HIGH (ref 70–99)
Glucose-Capillary: 244 mg/dL — ABNORMAL HIGH (ref 70–99)
Glucose-Capillary: 297 mg/dL — ABNORMAL HIGH (ref 70–99)
Glucose-Capillary: 352 mg/dL — ABNORMAL HIGH (ref 70–99)

## 2021-01-12 LAB — HEPARIN LEVEL (UNFRACTIONATED)
Heparin Unfractionated: 0.22 IU/mL — ABNORMAL LOW (ref 0.30–0.70)
Heparin Unfractionated: 0.25 IU/mL — ABNORMAL LOW (ref 0.30–0.70)

## 2021-01-12 MED ORDER — INSULIN GLARGINE 100 UNIT/ML ~~LOC~~ SOLN
20.0000 [IU] | Freq: Every day | SUBCUTANEOUS | Status: DC
  Administered 2021-01-12 – 2021-01-17 (×6): 20 [IU] via SUBCUTANEOUS
  Filled 2021-01-12 (×7): qty 0.2

## 2021-01-12 MED ORDER — KATE FARMS STANDARD 1.4 PO LIQD
325.0000 mL | Freq: Four times a day (QID) | ORAL | Status: DC
  Administered 2021-01-12: 325 mL
  Administered 2021-01-12 (×2): 160 mL
  Administered 2021-01-13 – 2021-01-20 (×32): 325 mL
  Administered 2021-01-21: 45 mL
  Administered 2021-01-21 – 2021-02-10 (×71): 325 mL
  Administered 2021-02-11: 175 mL
  Administered 2021-02-11: 170 mL
  Administered 2021-02-14 – 2021-02-15 (×2): 325 mL
  Filled 2021-01-12 (×144): qty 325

## 2021-01-12 MED ORDER — PROSOURCE TF PO LIQD
45.0000 mL | Freq: Three times a day (TID) | ORAL | Status: DC
  Administered 2021-01-12 – 2021-02-15 (×97): 45 mL
  Filled 2021-01-12 (×97): qty 45

## 2021-01-12 NOTE — Progress Notes (Signed)
ANTICOAGULATION CONSULT NOTE - Follow Up Consult  Pharmacy Consult for Heparin Indication: atrial fibrillation  Allergies  Allergen Reactions   Citalopram Hydrobromide Other (See Comments)    Dyskinesia Other reaction(s): Other (See Comments) Dyskinesia   Codeine Nausea And Vomiting and Other (See Comments)    HALLUCINATIONS Other reaction(s): Unknown Other reaction(s): Other (See Comments) HALLUCINATIONS    Hydromorphone Hcl Other (See Comments)    Made her pass out Other reaction(s): Other (See Comments) Made her pass out   Metoclopramide Other (See Comments)    DYSKINESIA Other reaction(s): Other (See Comments) DYSKINESIA   Hyoscyamine Other (See Comments)    MADE DIARRHEA WORSE    Patient Measurements: Height: 5\' 8"  (172.7 cm) Weight: 106 kg (233 lb 11 oz) IBW/kg (Calculated) : 63.9 Heparin Dosing Weight  Vital Signs: Temp: 98.6 F (37 C) (07/12 1539) Temp Source: Axillary (07/12 1539) Pulse Rate: 116 (07/12 1450)  Labs: Recent Labs    01/10/21 0316 01/11/21 0324 01/11/21 0822 01/12/21 0420 01/12/21 0742 01/12/21 1630  HGB 7.0*  --  7.6*  --  7.5*  --   HCT 21.9*  --  24.6*  --  24.0*  --   PLT 158  --  202  --  220  --   LABPROT  --   --  14.3  --   --   --   INR  --   --  1.1  --   --   --   HEPARINUNFRC 0.36 0.33  --  0.22*  --  0.25*  CREATININE 1.19*  --  1.15*  --   --   --     Estimated Creatinine Clearance: 57.2 mL/min (A) (by C-G formula based on SCr of 1.15 mg/dL (H)).   Assessment: Anticoag: Hep gtt for Afib while waiting on PEG placement - on apixaban PTA (LD 7/3 @0800 ) - HL 0.25 remains low. - hgb 7.5. plt wnl  - UE doppler: neg   Goal of Therapy:  Heparin level 0.3-0.7 units/ml Monitor platelets by anticoagulation protocol: Yes   Plan:  Increase IV heparin to 1250 units/hr Check heparin level in 6 hrs. Daily HL and CBC   [name] S. 03/15/21, PharmD, BCPS Clinical Staff Pharmacist [email or web address]   [place] 01/12/2021,6:11 PM

## 2021-01-12 NOTE — Progress Notes (Signed)
Nutrition Follow-up  DOCUMENTATION CODES:   Obesity unspecified  INTERVENTION:   Bolus TF via PEG: Dillard [place] Standard 1.4, 325 ml 4 times per day. Start with 1/2 bottle (163 ml) for the first 2 feedings, increase to 325 ml on third feeding. Recommend bolus slowly over 15-20 minutes to promote tolerance. Prosource TF 45 ml TID  Provides 1940 kcal, 113 gm protein daily.  NUTRITION DIAGNOSIS:   Inadequate oral intake related to inability to eat as evidenced by NPO status.  Ongoing   GOAL:   Provide needs based on ASPEN/SCCM guidelines  Met with TF  MONITOR:   Weight trends, TF tolerance, Labs, Vent status, Skin, I & O's  REASON FOR ASSESSMENT:   Consult Assessment of nutrition requirement/status, Enteral/tube feeding initiation and management  ASSESSMENT:   Pt with a PMH including T2DM, Afib, dementia, CHF, seizure disorder, CKD stage 3, CAD w/ prior NSTEMI, HTN, hypothyrodism, and dyslipidemia admitted with acute combined hypoxic and hypercapnic respiratory failure 2/2 CHF, HTN, restrictive lung disease 2/2 kyphosis. Note pt recently admitted from 6/20-6/29 for acute on chronic hypoxic hypercapnic respiratory failure 2/2 CHF exacerbations.  Discussed patient in ICU rounds and with RN today. S/P tracheostomy 7/7.  S/P PEG placement in IR on 7/11. Daughter requested that tube feeding be changed to [facility] as suggested by a family member. She reports that patient has trouble with diarrhea and hopes this formula will help prevent diarrhea.  Patient is currently intubated on ventilator support via trach MV: 6 L/min Temp (24hrs), Avg:98.9 F (37.2 C), Min:98.1 F (36.7 C), Max:99.3 F (37.4 C)  Propofol: off  Labs reviewed. Na 134 CBG: 179-190  Medications reviewed and include lasix, solumedrol, novolog, protonix.  Admission weight 106.1 kg Current weight 106 kg Lowest weight since admission 103.6 kg (7/10) I/O +4.5 L since admission  Intake/Output Summary  (Last 24 hours) at 01/12/2021 1151 Last data filed at 01/12/2021 1000 Gross per 24 hour  Intake 404.74 ml  Output 650 ml  Net -245.26 ml    Diet Order:   Diet Order             Diet NPO time specified Except for: Sips with Meds  Diet effective midnight                   EDUCATION NEEDS:   Not appropriate for education at this time  Skin:  Skin Assessment: Reviewed RN Assessment (skin tear R arm noted from recent admission)  Last BM:  7/12 type 6  Height:   Ht Readings from Last 1 Encounters:  01/03/21 _0  (1.727 m)    Weight:   Wt Readings from Last 1 Encounters:  01/12/21 106 kg    Ideal Body Weight:  63.63 kg  BMI:  Body mass index is 35.53 kg/m.  Estimated Nutritional Needs:   Kcal:  1224-8250  Protein:  110-130 gm  Fluid:  >/= 1.8 L    Lucas Mallow, RD, LDN, CNSC Please refer to Amion for contact information.

## 2021-01-12 NOTE — Progress Notes (Signed)
ANTICOAGULATION CONSULT NOTE  Pharmacy Consult for IV Heparin Indication: atrial fibrillation  Patient Measurements: Height: 5\' 8"  (172.7 cm) Weight: 106 kg (233 lb 11 oz) IBW/kg (Calculated) : 63.9 Heparin Dosing Weight: 87.8 kg   Vital Signs: Temp: 99.1 F (37.3 C) (07/12 0338) Temp Source: Oral (07/12 0338) BP: 139/75 (07/12 0600) Pulse Rate: 86 (07/12 0600)  Labs: Recent Labs    01/10/21 0316 01/11/21 0324 01/11/21 0822 01/12/21 0420  HGB 7.0*  --  7.6*  --   HCT 21.9*  --  24.6*  --   PLT 158  --  202  --   LABPROT  --   --  14.3  --   INR  --   --  1.1  --   HEPARINUNFRC 0.36 0.33  --  0.22*  CREATININE 1.19*  --  1.15*  --     Estimated Creatinine Clearance: 57.2 mL/min (A) (by C-G formula based on SCr of 1.15 mg/dL (H)).  Assessment: 70 yr old female with hx atrial fibrillation, for which was on apixaban PTA (last dose at 01/03/21 at 0800). Pharmacy was consulted to dose IV heparin while apixaban is on hold.   Heparin level of 0.22 is subtherapeutic on heparin 950 units/hr. Per RN no issues with IV infusion or acces.  No bleeding noted. PEG placed yesterday. CBC pending.   Goal of Therapy:  Heparin level 0.3-0.7 units/ml aPTT 66-102 seconds Monitor platelets by anticoagulation protocol: Yes   Plan:  - Increase IV heparin to 1050 units/hr - Check 8 hr heparin level   - Daily Heparin level and CBC while on therapy - Will continue to monitor for any signs/symptoms of bleeding - Follow up resuming oral anticoagulation now that PEG placed  Thank you for allowing pharmacy to be a part of this patient's care.  03/06/21, PharmD Clinical Pharmacist  01/12/2021, 7:39 AM

## 2021-01-12 NOTE — Progress Notes (Signed)
Pt placed on ATC 35% 5L of flow, tolerating well. [name] seems to be stimulating a constant discomfort and cough with movement. MD made aware. ATC as tolerated through today and vent tonight. MD will reassess if pt will tolerate ATC through the night tomorrow. Will pass on in RRT shift report.

## 2021-01-12 NOTE — Progress Notes (Signed)
NAME:  [name], MRN:  [id number], DOB:  1950-11-09, LOS: 9 ADMISSION DATE:  01/03/2021, CONSULTATION DATE:  01/03/2021 REFERRING MD:  EDP - [facility] CHIEF COMPLAINT:  SOB, encephalopathy   History of Present Illness:  70 yo female presented to [facility] on 7/03 with dyspnea, and altered mental status.  Transferred to [facility]. SpO2 65% and elevated PCO2.  Failed Bipap and required intubation.  Had admission from 12/21/20 to 12/30/20 for respiratory failure and CHF exacerbation.  Pertinent  Medical History  Kyphoscoliosis, Restrictive lung disease, Chronic respiratory failure on 2 liters, DM type 2, Chronic a fib on eliquis, Dementia, Chronic combined CHF, Hypothyroidism, Seizures, CKD 3a, CAD s/p NSTEMI, HLD, GERD  Significant Hospital Events:  Admitted to ICU on 01/03/21, transfer to [facility]. 7/3 CT LLL PNA.  7/4-7/5 Hypotension overnight. A line placed. LR bolus. Started on NE 7/6 Hgb 6.7 (7.4), CT A/P completed to r/o RP bleed (negative), showed mild peripancreatic stranding. Lipase slightly elevated. 1U PRBCs ordered. 7/7 Tracheostomy placed, heparin gtt held for procedure. 7/8 Increased agitation with Propofol wean, increased HR, uptrending pressor requirements. Cortrak placed. 7/11 PEG tube placed  Antibiotics Ceftriaxone 7/3 x 1 Azithromycin 7/3 >> 7/8 Cefepime 7/4 >> 7/8  Interim History / Subjective:  PEG tube placed yesterday, feeds starting today Family requested Molli Posey for tube feeds  Objective:  Blood pressure 139/75, pulse (!) 110, temperature 99.3 F (37.4 C), temperature source Axillary, resp. rate (!) 25, height 5\' 8"  (1.727 m), weight 106 kg, SpO2 96 %.    Vent Mode: PSV;CPAP FiO2 (%):  [30 %-100 %] 35 % Set Rate:  [20 bmp] 20 bmp Vt Set:  [510 mL] 510 mL PEEP:  [5 cmH20] 5 cmH20 Pressure Support:  [10 cmH20] 10 cmH20 Plateau Pressure:  [19 cmH20-21 cmH20] 20 cmH20   Intake/Output Summary (Last 24 hours) at 01/12/2021 1143 Last data filed at 01/12/2021 1000 Gross per  24 hour  Intake 404.74 ml  Output 650 ml  Net -245.26 ml    Filed Weights   01/10/21 0312 01/11/21 0325 01/12/21 0455  Weight: 103.6 kg 105.9 kg 106 kg   Physical Examination: General: Acute on chronically ill appearing elderly female resting comfortably in bed on mechanical ventilation through trach, in NAD HEENT: 6 cuffed shiley trach midline, MM pink/moist Neuro: Alert and following commands CV: bradycardic, murmur, rubs, or gallops PULM:  Bilateral wheezing, tolerating vent, no increased work of breathing GI: non-tender, non-distended, soft, + bowel sounds Extremities: warm/dry, no edema  Skin: no rashes or lesions  Resolved Hospital Problem List   Septic shock from pneumonia  Assessment & Plan:   Acute on chronic hypoxic/hypercapnic respiratory failure from acute pulmonary edema, pneumonia. Restrictive lung disease from kyphoscolosis, sleep disordered breathing. Failure to wean from vent s/p tracheostomy on 01/07/21. P: Continue ventilator support with lung protective strategies  Routine trach care  Remove trach sutures 7/14 Wean PEEP and FiO2 for sats greater than 90%. Head of bed elevated 30 degrees. Follow intermittent chest x-ray and ABG.   Ensure adequate pulmonary hygiene  Follow cultures  VAP bundle in place  PAD protocol  Relative adrenal insufficiency. P: Remains on solu corted, started 7/9 Continue Midodrine   Acute metabolic encephalopathy 2nd to sepsis and hypercapnia. Hx of CVA, seizures, dementia, depression. P: Maintain neuro protective measures Nutrition and bowel regiment  Seizure precautions  Continue Keppra and Neurontin  Home Cymbalta remains on hold  Aspirations precautions   Anemia of critical illness and chronic disease. P:  Hgb stably low at 7.0 No signs of bleeding or hemodynamic compromise  Trend CBC  Permanent atrial fibrillation. Acute on chronic combined CHF. -ECHO 45-50% with global hypokinesis  HLD. P: Cardiology  evaluated during admission, now signed off Continue per tube amiodarone, lasix, and statin  Continuous telemetry  CKD 3a. P: Follow renal function  Monitor urine output Trend Bmet Avoid nephrotoxins Ensure adequate renal perfusion   DM type 2 poorly controlled with hyperglycemia. P: Hold any further Lantus until PEG/enteral feeding plan can be established and consistent  Decreased dextrose  Stop tube feed coverage   Hypothyroidism. P: Continue Synthroid   Dysphagia. - PEG in place  Left upper extremity swelling and redness  P: Obtain doppler   Best Practice    Diet/type: PEG tube feeds DVT prophylaxis: systemic heparin GI prophylaxis: PPI Lines: yes and it is still needed Foley:  Yes, and it is still needed Code Status:  full code Last date of multidisciplinary goals of care discussion [7/4] Disposition: family agreeable to LTAC placement and social work looking into [facility] Updated pt's daughter at bedside on 01/10/21.   Doristine Devoid, MS4 01/12/2021, 11:43 AM

## 2021-01-13 DIAGNOSIS — J9621 Acute and chronic respiratory failure with hypoxia: Secondary | ICD-10-CM | POA: Diagnosis not present

## 2021-01-13 DIAGNOSIS — J9622 Acute and chronic respiratory failure with hypercapnia: Secondary | ICD-10-CM | POA: Diagnosis not present

## 2021-01-13 DIAGNOSIS — G9340 Encephalopathy, unspecified: Secondary | ICD-10-CM | POA: Diagnosis not present

## 2021-01-13 DIAGNOSIS — Z930 Tracheostomy status: Secondary | ICD-10-CM

## 2021-01-13 DIAGNOSIS — G40909 Epilepsy, unspecified, not intractable, without status epilepticus: Secondary | ICD-10-CM

## 2021-01-13 LAB — CBC
HCT: 22.4 % — ABNORMAL LOW (ref 36.0–46.0)
Hemoglobin: 7.1 g/dL — ABNORMAL LOW (ref 12.0–15.0)
MCH: 28.6 pg (ref 26.0–34.0)
MCHC: 31.7 g/dL (ref 30.0–36.0)
MCV: 90.3 fL (ref 80.0–100.0)
Platelets: 210 10*3/uL (ref 150–400)
RBC: 2.48 MIL/uL — ABNORMAL LOW (ref 3.87–5.11)
RDW: 18.9 % — ABNORMAL HIGH (ref 11.5–15.5)
WBC: 7.6 10*3/uL (ref 4.0–10.5)
nRBC: 0.4 % — ABNORMAL HIGH (ref 0.0–0.2)

## 2021-01-13 LAB — GLUCOSE, CAPILLARY
Glucose-Capillary: 227 mg/dL — ABNORMAL HIGH (ref 70–99)
Glucose-Capillary: 232 mg/dL — ABNORMAL HIGH (ref 70–99)
Glucose-Capillary: 285 mg/dL — ABNORMAL HIGH (ref 70–99)
Glucose-Capillary: 288 mg/dL — ABNORMAL HIGH (ref 70–99)
Glucose-Capillary: 316 mg/dL — ABNORMAL HIGH (ref 70–99)
Glucose-Capillary: 357 mg/dL — ABNORMAL HIGH (ref 70–99)

## 2021-01-13 LAB — HEPARIN LEVEL (UNFRACTIONATED): Heparin Unfractionated: 0.34 IU/mL (ref 0.30–0.70)

## 2021-01-13 MED ORDER — APIXABAN 5 MG PO TABS
5.0000 mg | ORAL_TABLET | Freq: Two times a day (BID) | ORAL | Status: DC
  Administered 2021-01-13 – 2021-01-21 (×18): 5 mg
  Filled 2021-01-13 (×19): qty 1

## 2021-01-13 MED ORDER — INSULIN ASPART 100 UNIT/ML IJ SOLN
5.0000 [IU] | Freq: Four times a day (QID) | INTRAMUSCULAR | Status: DC
  Administered 2021-01-13 – 2021-01-14 (×4): 5 [IU] via SUBCUTANEOUS

## 2021-01-13 MED ORDER — HYDROCORTISONE NA SUCCINATE PF 100 MG IJ SOLR
50.0000 mg | Freq: Every day | INTRAMUSCULAR | Status: AC
  Administered 2021-01-14 – 2021-01-15 (×2): 50 mg via INTRAVENOUS
  Filled 2021-01-13 (×2): qty 2

## 2021-01-13 NOTE — Progress Notes (Signed)
NAME:  [name], MRN:  [id number], DOB:  05-25-1951, LOS: 10 ADMISSION DATE:  01/03/2021, CONSULTATION DATE:  01/03/2021 REFERRING MD:  EDP - [facility] CHIEF COMPLAINT:  SOB, encephalopathy   History of Present Illness:  70 yo female presented to [facility] on 7/03 with dyspnea, and altered mental status.  Transferred to [facility]. SpO2 65% and elevated PCO2.  Failed Bipap and required intubation.  Had admission from 12/21/20 to 12/30/20 for respiratory failure and CHF exacerbation.  Pertinent  Medical History  Kyphoscoliosis, Restrictive lung disease, Chronic respiratory failure on 2 liters, DM type 2, Chronic a fib on eliquis, Dementia, Chronic combined CHF, Hypothyroidism, Seizures, CKD 3a, CAD s/p NSTEMI, HLD, GERD  Significant Hospital Events:  Admitted to ICU on 01/03/21, transfer to [facility]. 7/3 CT LLL PNA. Ceftriaxone 7/3 x 1. Azith and Cefepime started.  7/4-7/5 Hypotension overnight. A line placed. LR bolus. Started on NE 7/6 Hgb 6.7 (7.4), CT A/P completed to r/o RP bleed (negative), showed mild peripancreatic stranding. Lipase slightly elevated. 1U PRBCs ordered. 7/7 Tracheostomy placed, heparin gtt held for procedure. 7/8 Increased agitation with Propofol wean, increased HR, uptrending pressor requirements. Cortrak placed. Azith and cefepime stopped.  7/11 PEG tube placed, tolerating PSV 7/12 ATC trials started. Tubefeeds started/resumed 7/13 solucortef decreased to daily. Midodrine stopped. Heparin gtt stopped. Stated back on DOAC. PANDA removed    Interim History / Subjective:  She appears comfortable.  Currently on aerosol trach collar  Objective:  Blood pressure (Abnormal) 148/80, pulse 83, temperature 98.1 F (36.7 C), temperature source Axillary, resp. rate (Abnormal) 27, height 5\' 8"  (1.727 m), weight 104.4 kg, SpO2 99 %.    Vent Mode: PSV;CPAP FiO2 (%):  [30 %-35 %] 35 % Set Rate:  [20 bmp] 20 bmp Vt Set:  [510 mL] 510 mL PEEP:  [5 cmH20] 5 cmH20 Pressure Support:  [10 cmH20] 10  cmH20 Plateau Pressure:  [21 cmH20-22 cmH20] 21 cmH20   Intake/Output Summary (Last 24 hours) at 01/13/2021 1101 Last data filed at 01/13/2021 1029 Gross per 24 hour  Intake 332.57 ml  Output 1900 ml  Net -1567.43 ml   Filed Weights   01/12/21 0455 01/13/21 0419 01/13/21 1000  Weight: 106 kg 104.4 kg 104.4 kg   Physical Examination:  General this is a 70 year old female patient she is currently lying in bed and in no acute distress on aerosol trach collar HEENT normocephalic atraumatic she currently has a size 6 cuffed tracheostomy in place the trach is inflated she still has some yellow purulent tracheal secretions Pulmonary: Scattered rhonchi no accessory use currently on aerosol trach collar saturations mid 90s Cardiac: Regular irregular with soft systolic murmur atrial fibrillation noted on telemetry Abdomen: Soft, not tender.  PEG site appears unremarkable tolerating tube feeds Extremities diffuse anasarca scattered areas of ecchymosis, capillary refill is brisk, pulses are palpable Neuro: Awake, interactive, follows commands, has generalized weakness in all extremities GU: External female urinary drainage device in place  Resolved Hospital Problem List   Septic shock from pneumonia  Assessment & Plan:   Acute on chronic hypoxic/hypercapnic respiratory failure from acute pulmonary edema, pneumonia. Restrictive lung disease from kyphoscolosis, sleep disordered breathing. Failure to wean from vent s/p tracheostomy on 01/07/21. Plan Remove tracheostomy sutures 7/14 Continue to encourage aerosol trach collar and pressure support during the day with focus to hopefully liberate from daytime need for ventilation, but continue nocturnal vent support VAP bundles PAD protocol with RASS 0 Mobilize PT/OT tracheostomy care teaching to family  Relative  adrenal insufficiency. Plan Solu-Cortef started on 7/9, have been weaning this with last dose adjusted on 7/11 stop date placed  Dc  midodrine   Acute metabolic encephalopathy 2nd to sepsis and hypercapnia. Hx of CVA, seizures, dementia, depression. Plan Seizure precautions Keppra and Neurontin due to continue Supportive care including nutritional and bowel regimen as well as frequent reorientation Cymbalta has been on hold, at this point she is out of window for withdrawal I think we can discontinue this for good   Chronic permanent atrial fibrillation. Acute on chronic combined CHF: -ECHO 45-50% with global hypokinesis  HLD. Cardiology evaluated during admission, now signed off Plan Continuing amiodarone via tube Continuing current furosemide and statin Continue amiodarone Will change systemic heparin back to DOAC and give via tube  CKD 3a. -Last blood chemistry stable on 7/11 Plan Avoid nephrotoxins Track urine output Will check a.m. chemistry  DM type 2 poorly controlled with hyperglycemia. Still has poor glycemic control, glucose is ranging 220s to 280s today Plan Recommendations have been made by diabetic coordinator, will proceed with as follows Lantus 20 units daily, NovoLog sliding scale moderate every 4 hours 4 times daily NovoLog to be given with tube feeds, 5 units at 8, 12, 4, and 8 Continuing to taper steroids  Anemia of critical illness and chronic disease. Hgb stably low at 7.0 No signs of bleeding or hemodynamic compromise  Plan Trend CBC  Hypothyroidism. Plan Synthroid via tube  Dysphagia post PEG placement Plan Continue tube feeds  Left upper extremity swelling and redness  Vascular ultrasound performed on 710: This was negative for DVT Plan Keep arm elevated Encourage active and passive range of motion Evaluate daily to assure no evidence of infection or skin breakdown  Best Practice    Best Practice (right click and "Reselect all SmartList Selections" daily)   Diet/type: tubefeeds DVT prophylaxis: DOAC GI prophylaxis: PPI Lines: N/A Foley:  N/A Code Status:   full code   Last date of multidisciplinary goals of care discussion [7/4] Disposition: family agreeable to LTAC placement and social work looking into [facility] Updated pt's daughter at bedside o 7/13   My cct 34 min  Simonne Martinet ACNP-BC [facility] Pulmonary/Critical Care Pager # [phone] OR # [phone] if no answer

## 2021-01-13 NOTE — Progress Notes (Addendum)
Inpatient Diabetes Program Recommendations  AACE/ADA: New Consensus Statement on Inpatient Glycemic Control (2015)  Target Ranges:  Prepandial:   less than 140 mg/dL      Peak postprandial:   less than 180 mg/dL (1-2 hours)      Critically ill patients:  140 - 180 mg/dL   Results for [name], [name] (MRN [id number]) as of 01/13/2021 10:19  Ref. Range 01/12/2021 23:49 01/13/2021 03:33 01/13/2021 07:58  Glucose-Capillary Latest Ref Range: 70 - 99 mg/dL 962 (H)  15 units NOVOLOG  288 (H)  8 units NOVOLOG  227 (H)  5 units NOVOLOG  20 units LANTUS     Home DM Meds: Lantus 20 units Daily  Current Orders: Lantus 20 units Daily     Novolog Moderate Correction Scale/ SSI (0-15 units) Q4 hours    MD- Note patient getting Bolus tube feedings 325 ml QID (8am, 12pm, 4pm, 8pm)  Please consider adding the following Novolog tube feed coverage:  Novolog 5 units QID (8am, 12pm, 4pm, 8pm) to be given with each bolus feed in addition to the ordered Novolog SSI    --Will follow patient during hospitalization--  Ambrose Finland RN, MSN, CDE Diabetes Coordinator Inpatient Glycemic Control Team Team Pager: [phone] (8a-5p)

## 2021-01-13 NOTE — Progress Notes (Signed)
ANTICOAGULATION CONSULT NOTE - Follow Up Consult  Pharmacy Consult for Heparin Indication: atrial fibrillation  Allergies  Allergen Reactions   Citalopram Hydrobromide Other (See Comments)    Dyskinesia Other reaction(s): Other (See Comments) Dyskinesia   Codeine Nausea And Vomiting and Other (See Comments)    HALLUCINATIONS Other reaction(s): Unknown Other reaction(s): Other (See Comments) HALLUCINATIONS    Hydromorphone Hcl Other (See Comments)    Made her pass out Other reaction(s): Other (See Comments) Made her pass out   Metoclopramide Other (See Comments)    DYSKINESIA Other reaction(s): Other (See Comments) DYSKINESIA   Hyoscyamine Other (See Comments)    MADE DIARRHEA WORSE    Patient Measurements: Height: 5\' 8"  (172.7 cm) Weight: 104.4 kg (230 lb 2.6 oz) IBW/kg (Calculated) : 63.9  Vital Signs: Temp: 98.1 F (36.7 C) (07/13 0759) Temp Source: Axillary (07/13 0759) BP: 148/80 (07/13 1000) Pulse Rate: 83 (07/13 1000)  Labs: Recent Labs    01/11/21 0822 01/12/21 0420 01/12/21 0742 01/12/21 1630 01/12/21 2357 01/13/21 0421  HGB 7.6*  --  7.5*  --   --  7.1*  HCT 24.6*  --  24.0*  --   --  22.4*  PLT 202  --  220  --   --  210  LABPROT 14.3  --   --   --   --   --   INR 1.1  --   --   --   --   --   HEPARINUNFRC  --  0.22*  --  0.25* 0.34  --   CREATININE 1.15*  --   --   --   --   --      Estimated Creatinine Clearance: 56.7 mL/min (A) (by C-G formula based on SCr of 1.15 mg/dL (H)).   Assessment: Anticoag: Hep gtt for Afib, will transition to Eliquis - HL 0.34 within range - hgb 7.1. plt wnl  - UE doppler: neg   Goal of Therapy:  Heparin level 0.3-0.7 units/ml Monitor platelets by anticoagulation protocol: Yes   Plan:  Start Eliquis 5 mg twice daily Discontinue IV heparin when Eliquis is given  CBC in AM  Thank you for allowing pharmacy to participate in this patient's care.  01/15/21, PharmD PGY1 Pharmacy Resident 01/13/2021  10:59 AM Check [email or web address] for unit specific pharmacy number

## 2021-01-13 NOTE — Progress Notes (Signed)
Educated daughter and demonstrated how to change trach inner cannula as well as changing trach tie. Daughter stated she understood and stated it "looks easy".

## 2021-01-14 DIAGNOSIS — J9621 Acute and chronic respiratory failure with hypoxia: Secondary | ICD-10-CM | POA: Diagnosis not present

## 2021-01-14 DIAGNOSIS — J9622 Acute and chronic respiratory failure with hypercapnia: Secondary | ICD-10-CM | POA: Diagnosis not present

## 2021-01-14 LAB — GLUCOSE, CAPILLARY
Glucose-Capillary: 209 mg/dL — ABNORMAL HIGH (ref 70–99)
Glucose-Capillary: 211 mg/dL — ABNORMAL HIGH (ref 70–99)
Glucose-Capillary: 217 mg/dL — ABNORMAL HIGH (ref 70–99)
Glucose-Capillary: 241 mg/dL — ABNORMAL HIGH (ref 70–99)
Glucose-Capillary: 300 mg/dL — ABNORMAL HIGH (ref 70–99)
Glucose-Capillary: 303 mg/dL — ABNORMAL HIGH (ref 70–99)

## 2021-01-14 LAB — CBC
HCT: 23.2 % — ABNORMAL LOW (ref 36.0–46.0)
Hemoglobin: 7.3 g/dL — ABNORMAL LOW (ref 12.0–15.0)
MCH: 28.6 pg (ref 26.0–34.0)
MCHC: 31.5 g/dL (ref 30.0–36.0)
MCV: 91 fL (ref 80.0–100.0)
Platelets: 213 10*3/uL (ref 150–400)
RBC: 2.55 MIL/uL — ABNORMAL LOW (ref 3.87–5.11)
RDW: 19 % — ABNORMAL HIGH (ref 11.5–15.5)
WBC: 10.4 10*3/uL (ref 4.0–10.5)
nRBC: 0.2 % (ref 0.0–0.2)

## 2021-01-14 LAB — BASIC METABOLIC PANEL
Anion gap: 7 (ref 5–15)
BUN: 30 mg/dL — ABNORMAL HIGH (ref 8–23)
CO2: 25 mmol/L (ref 22–32)
Calcium: 8.7 mg/dL — ABNORMAL LOW (ref 8.9–10.3)
Chloride: 110 mmol/L (ref 98–111)
Creatinine, Ser: 0.97 mg/dL (ref 0.44–1.00)
GFR, Estimated: >60 mL/min (ref >60)
Glucose, Bld: 247 mg/dL — ABNORMAL HIGH (ref 70–99)
Potassium: 3 mmol/L — ABNORMAL LOW (ref 3.5–5.1)
Sodium: 142 mmol/L (ref 135–145)

## 2021-01-14 LAB — MAGNESIUM: Magnesium: 1.8 mg/dL (ref 1.7–2.4)

## 2021-01-14 MED ORDER — POTASSIUM CHLORIDE 20 MEQ PO PACK
20.0000 meq | PACK | ORAL | Status: AC
  Administered 2021-01-14 (×2): 20 meq
  Filled 2021-01-14 (×2): qty 1

## 2021-01-14 MED ORDER — INSULIN ASPART 100 UNIT/ML IJ SOLN
10.0000 [IU] | Freq: Four times a day (QID) | INTRAMUSCULAR | Status: DC
  Administered 2021-01-14 – 2021-02-01 (×56): 10 [IU] via SUBCUTANEOUS

## 2021-01-14 MED ORDER — POTASSIUM CHLORIDE 10 MEQ/50ML IV SOLN
10.0000 meq | INTRAVENOUS | Status: AC
  Administered 2021-01-14 (×4): 10 meq via INTRAVENOUS
  Filled 2021-01-14 (×4): qty 50

## 2021-01-14 MED ORDER — MELATONIN 3 MG PO TABS
3.0000 mg | ORAL_TABLET | Freq: Every evening | ORAL | Status: DC | PRN
Start: 2021-01-14 — End: 2021-01-15
  Filled 2021-01-14: qty 1

## 2021-01-14 MED ORDER — BACITRACIN-NEOMYCIN-POLYMYXIN OINTMENT TUBE
TOPICAL_OINTMENT | Freq: Every day | CUTANEOUS | Status: AC
  Administered 2021-01-14: 1 via TOPICAL
  Filled 2021-01-14: qty 14

## 2021-01-14 NOTE — Progress Notes (Signed)
[facility] ADULT ICU REPLACEMENT PROTOCOL   The patient does apply for the [facility] Adult ICU Electrolyte Replacment Protocol based on the criteria listed below:   1.Exclusion criteria: TCTS patients, ECMO patients and Hypothermia Protocol, and   Dialysis patients 2. Is GFR >/= 30 ml/min? Yes.    Patient's GFR today is >60 3. Is SCr </= 2? Yes.   Patient's SCr is 0.97 mg/dL 4. Did SCr increase >/= 0.5 in 24 hours? No. 5.Pt's weight >40kg  Yes.   6. Abnormal electrolyte(s): K 3.0  7. Electrolytes replaced per protocol   Ardelle Park 01/14/2021 6:45 AM

## 2021-01-14 NOTE — Progress Notes (Signed)
SLP Cancellation Note  Patient Details Name: RABAB CURRINGTON MRN: [id number] DOB: 1951-05-30   Cancelled treatment:       Reason Eval/Treat Not Completed: Patient not medically ready (Pt remains on the vent at this time. SLP will follow to assess readiness for PMSV eval.)  Sharalee Witman I. Vear Clock, MS, CCC-SLP Acute Rehabilitation Services Office number [phone] Pager [phone]  [name] 01/14/2021, 3:14 PM

## 2021-01-14 NOTE — Progress Notes (Addendum)
NAME:  [name], MRN:  [id number], DOB:  1950-07-16, LOS: 11 ADMISSION DATE:  01/03/2021, CONSULTATION DATE:  01/03/2021 REFERRING MD:  EDP - [facility] CHIEF COMPLAINT:  SOB, encephalopathy   History of Present Illness:  70 yo female presented to [facility] on 7/03 with dyspnea, and altered mental status.  Transferred to [facility]. SpO2 65% and elevated PCO2.  Failed Bipap and required intubation.  Had admission from 12/21/20 to 12/30/20 for respiratory failure and CHF exacerbation.  Pertinent  Medical History  Kyphoscoliosis, Restrictive lung disease, Chronic respiratory failure on 2 liters, DM type 2, Chronic a fib on eliquis, Dementia, Chronic combined CHF, Hypothyroidism, Seizures, CKD 3a, CAD s/p NSTEMI, HLD, GERD  Significant Hospital Events:  Admitted to ICU on 01/03/21, transfer to [facility]. 7/3 CT LLL PNA. Ceftriaxone 7/3 x 1. Azith and Cefepime started.  7/4-7/5 Hypotension overnight. A line placed. LR bolus. Started on NE 7/6 Hgb 6.7 (7.4), CT A/P completed to r/o RP bleed (negative), showed mild peripancreatic stranding. Lipase slightly elevated. 1U PRBCs ordered. 7/7 Tracheostomy placed, heparin gtt held for procedure. 7/8 Increased agitation with Propofol wean, increased HR, uptrending pressor requirements. Cortrak placed. Azith and cefepime stopped.  7/11 PEG tube placed, tolerating PSV 7/12 ATC trials started. Tubefeeds started/resumed 7/13 solucortef decreased to daily. Midodrine stopped. Heparin gtt stopped. Stated back on DOAC. PANDA removed 7/14 SLP consulted for PMV trials    Interim History / Subjective:  She appears comfortable.  Currently on aerosol trach collar  Objective:  Blood pressure 132/74, pulse 78, temperature 98.5 F (36.9 C), temperature source Oral, resp. rate 16, height 5\' 8"  (1.727 m), weight 103.3 kg, SpO2 100 %.    Vent Mode: CPAP;PSV FiO2 (%):  [30 %-35 %] 30 % Set Rate:  [20 bmp] 20 bmp Vt Set:  [510 mL] 510 mL PEEP:  [5 cmH20] 5 cmH20 Pressure Support:  [10  cmH20] 10 cmH20 Plateau Pressure:  [20 cmH20-26 cmH20] 20 cmH20   Intake/Output Summary (Last 24 hours) at 01/14/2021 0930 Last data filed at 01/14/2021 0800 Gross per 24 hour  Intake 1222.48 ml  Output 1050 ml  Net 172.48 ml   Filed Weights   01/13/21 0419 01/13/21 1000 01/14/21 0417  Weight: 104.4 kg 104.4 kg 103.3 kg   Physical Examination: General: 70 year old white female she is debilitated, currently on pressure support ventilation.  Appears much more comfortable today much more interactive and awake today than when comparing day prior HEENT normocephalic/atraumatic no jugular venous distention sclera nonicteric tracheostomy unremarkable currently cuffed size 6 tracheostomy Pulmonary: Some scattered rhonchi no accessory use tolerating pressure support ventilation of 10 diminished in bases Cardiac: Regular irregular, currently atrial fibrillation with controlled ventricular rate no murmur rub or gallop appreciated Abdomen: Soft, tolerating tube feeds.  PEG site unremarkable Extremities: Some trace lower extremity edema, brisk capillary refill, pulses are strong, some scattered areas of ecchymosis Neuro: Interactive and awake today.  Follows commands.  Moves all extremities.  Has generalized weakness but actually seems stronger today GU: Clear yellow  Resolved Hospital Problem List   Septic shock from pneumonia Left upper extremity redness and swelling resolved Assessment & Plan:   Acute on chronic hypoxic/hypercapnic respiratory failure from acute pulmonary edema, pneumonia. Restrictive lung disease from kyphoscolosis, sleep disordered breathing. Failure to wean from vent s/p tracheostomy on 01/07/21. Plan Remove tracheostomy sutures today 7/14  Continue to push aerosol trach collar and pressure support ventilation during the day, full ventilator rest support at night, suspect she will need nocturnal  ventilation and family is committed to assisting her at home.  This will require  extensive family education  SLP consult today to initiate Passy-Muir valve trials  Out of bed  RASS goal 0  VAP bundle  Continue tracheostomy teaching with family, trach care team consulted  Ideally I think she would benefit from LTAC setting for ongoing rehabilitation as well as patient family teaching in regards to nocturnal ventilation to maximize comfort and independence    Relative adrenal insufficiency. Plan Solu-Cortef started 7/9, stop date placed actively weaning.     Acute metabolic encephalopathy 2nd to sepsis and hypercapnia. Hx of CVA, seizures, dementia, depression. Plan Continue seizure precautions, continue Keppra and Neurontin  Supportive care  Seen by seizure precautions   Chronic permanent atrial fibrillation. Acute on chronic combined CHF: -ECHO 45-50% with global hypokinesis  HLD. Cardiology evaluated during admission, now signed off Plan Continue amiodarone via tube  Continue furosemide and statin at current dosing  Continue DOAC  Telemetry monitoring   CKD 3a. Serum creatinine stable Plan Avoid nephrotoxins Continue current diuretic regimen Intermittent chemistries  Fluid and electrolyte imbalance: Hypokalemia Plan Add potassium replacement   DM type 2 poorly controlled with hyperglycemia. Adjusted glycemic control once again on 7/13 still hyperglycemic in spite of these measures Plan Continue Lantus at 20 units daily Continue sliding scale insulin moderate every 4 hours Increase mealtime coverage of NovoLog to 10 units at 8, 12, 4, and 8  Continue to taper steroids    Anemia of critical illness and chronic disease. No evidence of bleeding Plan Trend CBC, hemoglobin goal of greater than 7  Hypothyroidism. Plan Synthroid via tube  Dysphagia post PEG placement Plan Continue bolus tube feeds Consider SLP consult at some point for swallowing  Best Practice    Best Practice (right click and "Reselect all SmartList Selections"  daily)   Diet/type: tubefeeds DVT prophylaxis: DOAC GI prophylaxis: PPI Lines: Central line and yes and it is still needed Has PICC RUE  Foley:  N/A Code Status:  full code   Last date of multidisciplinary goals of care discussion [7/4] Disposition: family agreeable to LTAC placement and social work looking into [facility] Updated pt's daughter at bedside o 7/13   My cct 34 min   Simonne Martinet ACNP-BC [facility] Pulmonary/Critical Care Pager # [phone] OR # [phone] if no answer

## 2021-01-14 NOTE — Progress Notes (Signed)
Physical Therapy Evaluation Patient Details Name: [name] MRN: [id number] DOB: [birth year] Today's Date: 01/14/2021   History of Present Illness  [name] is a 70 y.o. female with medical history significant for obesity, type 2 diabetes, chronic atrial fibrillation on Eliquis, dementia, diastolic CHF with chronic hypoxemia on 2 L nasal cannula, hypothyroidism, seizure disorder, CKD stage III, CAD with prior NSTEMI, dyslipidemia, and GERD who presented to the ED with worsening shortness of breath over the past couple days.  Daughter at bedside states that she has had approximately 20 pound weight gain noted as well over the same timeframe.  Her symptoms were worsening and EMS was called and upon arrival she was noted to have pulse oximetry of 55%.  She was noted to be taking her medications normally according to the daughter and was wearing her home oxygen.  She denied any chest pain, cough, fevers, or chills.  Some mild lower extremity edema has been noted as well.  No other sick contacts and she has had her COVID vaccines and booster shots.  She appears to have been admitted just last month with acute on chronic diastolic CHF.  Her discharge weight at that time was 214 pounds.  She is currently at 227 pounds.  Clinical Impression  Pt admitted with above diagnosis. Pt was able to sit EOB for 15 min with min to max assist of 2.  Pt participating but fatigues quickly.  Will benefit from [facility] as she has good potential. Will follow acutely.  Pt currently with functional limitations due to the deficits listed below (see PT Problem List). Pt will benefit from skilled PT to increase their independence and safety with mobility to allow discharge to the venue listed below.       Follow Up Recommendations LTACH;Supervision/Assistance - 24 hour    Equipment Recommendations  None recommended by PT    Recommendations for Other Services       Precautions / Restrictions Precautions Precautions:  Fall Precaution Comments: vent with trach, foley Restrictions Weight Bearing Restrictions: No      Mobility  Bed Mobility Overal bed mobility: Needs Assistance Bed Mobility: Supine to Sit;Sit to Supine     Supine to sit: Mod assist;Max assist;+2 for physical assistance Sit to supine: Total assist;+2 for physical assistance   General bed mobility comments: Pt needed mod to max assist for LEs and trunk elevation to EOB.    Transfers Overall transfer level: Needs assistance               General transfer comment: Unable to attempt  Ambulation/Gait                Stairs            Wheelchair Mobility    Modified Rankin (Stroke Patients Only)       Balance Overall balance assessment: Needs assistance Sitting-balance support: Single extremity supported;Bilateral upper extremity supported;Feet supported;No upper extremity supported Sitting balance-Leahy Scale: Zero Sitting balance - Comments: Pt requiring min assist to sit eOB at times and then max as she fatigues. Pt was able to complete some LE exercises.                                     Pertinent Vitals/Pain Pain Assessment: No/denies pain    Home Living Family/patient expects to be discharged to:: Skilled nursing facility Living Arrangements: Spouse/significant other;Children Available Help at Discharge: Family;Available  24 hours/day Type of Home: Mobile home Home Access: Ramped entrance     Home Layout: One level Home Equipment: Bedside commode;Toilet riser;Shower seat;Walker - 2 wheels;Cane - single point;Wheelchair - manual;Hospital bed;Other (comment) (Home O2)      Prior Function Level of Independence: Needs assistance   Gait / Transfers Assistance Needed: [place] ambulator using RWwith min assist per daughter PTA, on 2 LPM O2 constant  ADL's / Homemaking Assistance Needed: assisted by family  Comments: history of falls     Hand Dominance   Dominant Hand:  Right    Extremity/Trunk Assessment   Upper Extremity Assessment Upper Extremity Assessment: Defer to OT evaluation    Lower Extremity Assessment Lower Extremity Assessment: Generalized weakness    Cervical / Trunk Assessment Cervical / Trunk Assessment: Kyphotic  Communication   Communication: No difficulties  Cognition Arousal/Alertness: Awake/alert Behavior During Therapy: WFL for tasks assessed/performed Overall Cognitive Status: Within Functional Limits for tasks assessed                                        General Comments General comments (skin integrity, edema, etc.): 90 bpm, 100% 30, 134/74, Pt on PS/CPAP at 30% FiO2 and PEEP 5.    Exercises General Exercises - Lower Extremity Ankle Circles/Pumps: AROM;Both;5 reps Quad Sets: Strengthening;Both;Supine;AROM;5 reps Long Arc Quad: AROM;Both;5 reps;Seated Heel Slides: AAROM;Both;Supine;5 reps   Assessment/Plan    PT Assessment Patient needs continued PT services  PT Problem List Decreased strength;Decreased activity tolerance;Decreased balance;Decreased mobility;Cardiopulmonary status limiting activity;Obesity       PT Treatment Interventions DME instruction;Gait training;Functional mobility training;Stair training;Therapeutic activities;Patient/family education;Balance training;Therapeutic exercise    PT Goals (Current goals can be found in the Care Plan section)  Acute Rehab PT Goals Patient Stated Goal: To get stronger PT Goal Formulation: With patient/family Time For Goal Achievement: 01/28/21 Potential to Achieve Goals: Good    Frequency Min 3X/week   Barriers to discharge        Co-evaluation               AM-PAC PT "6 Clicks" Mobility  Outcome Measure Help needed turning from your back to your side while in a flat bed without using bedrails?: A Lot Help needed moving from lying on your back to sitting on the side of a flat bed without using bedrails?: Total Help needed  moving to and from a bed to a chair (including a wheelchair)?: Total Help needed standing up from a chair using your arms (e.g., wheelchair or bedside chair)?: Total Help needed to walk in hospital room?: Total Help needed climbing 3-5 steps with a railing? : Total 6 Click Score: 7    End of Session Equipment Utilized During Treatment: Gait belt (vent with trach) Activity Tolerance: Patient limited by fatigue Patient left: with call bell/phone within reach;in bed;with family/visitor present;with bed alarm set Nurse Communication: Mobility status;Need for lift equipment PT Visit Diagnosis: Unsteadiness on feet (R26.81);Muscle weakness (generalized) (M62.81);Difficulty in walking, not elsewhere classified (R26.2)    Time: 1110-1135 PT Time Calculation (min) (ACUTE ONLY): 25 min   Charges:   PT Evaluation $PT Eval Moderate Complexity: 1 Mod PT Treatments $Therapeutic Activity: 8-22 mins        Khira Cudmore M,PT Acute Rehab Services [phone] (pager)   Bevelyn Buckles 01/14/2021, 1:33 PM

## 2021-01-14 NOTE — Progress Notes (Signed)
Value: K+ 3  Date and time results received: 01/14/21 6:41 AM   Name of Provider Notified: elink  Orders Received? Or Actions Taken? See/awaiting orders

## 2021-01-14 NOTE — Progress Notes (Signed)
eLink Physician-Brief Progress Note Patient Name: [name] DOB: 1951-01-07 MRN: [id number]   Date of Service  01/14/2021  HPI/Events of Note  Patient requesting a sleep aid, she is wide awake.  eICU Interventions  Melatonin 3 mg po Q HS PRN insomnia ordered.        Thomasene Lot [name] 01/14/2021, 3:27 AM

## 2021-01-14 NOTE — Progress Notes (Signed)
Nutrition Follow-up  DOCUMENTATION CODES:   Obesity unspecified  INTERVENTION:   Continue bolus TF via PEG: Dillard [place] Standard 1.4, 325 ml 4 times per day. Start with 1/2 bottle (163 ml) for the first 2 feedings, increase to 325 ml on third feeding. Recommend bolus slowly over 15-20 minutes to promote tolerance. Prosource TF 45 ml TID  Provides 1940 kcal, 113 gm protein daily.  NUTRITION DIAGNOSIS:   Inadequate oral intake related to inability to eat as evidenced by NPO status.  Ongoing   GOAL:   Provide needs based on ASPEN/SCCM guidelines  Met with TF  MONITOR:   Weight trends, TF tolerance, Labs, Vent status, Skin, I & O's  REASON FOR ASSESSMENT:   Consult Assessment of nutrition requirement/status, Enteral/tube feeding initiation and management  ASSESSMENT:   Pt with a PMH including T2DM, Afib, dementia, CHF, seizure disorder, CKD stage 3, CAD w/ prior NSTEMI, HTN, hypothyrodism, and dyslipidemia admitted with acute combined hypoxic and hypercapnic respiratory failure 2/2 CHF, HTN, restrictive lung disease 2/2 kyphosis. Note pt recently admitted from 6/20-6/29 for acute on chronic hypoxic hypercapnic respiratory failure 2/2 CHF exacerbations.  S/P tracheostomy 7/7.  Tolerating trach collar trials.  S/P PEG placement in IR on 7/11.  Bolus feedings via PEG initiated 7/12. Patient is tolerating bolus TF without difficulty. Insulin dosing adjusted to be given with tube feeding boluses. Dose being adjusted for better glycemic control.  Patient remains intubated on ventilator support via trach MV: 7 L/min Temp (24hrs), Avg:98.7 F (37.1 C), Min:98.2 F (36.8 C), Max:99.2 F (37.3 C)   Labs reviewed. K 3 (L) CBG: [phone]  Medications reviewed and include lasix, solu-cortef, novolog, lantus, protonix.  Admission weight 106.1 kg Current weight 103.3 kg (lowest weight since admission) I/O +3.5 L since admission   Diet Order:   Diet Order              Diet NPO time specified Except for: Sips with Meds  Diet effective midnight                   EDUCATION NEEDS:   Not appropriate for education at this time  Skin:  Skin Assessment: Reviewed RN Assessment (skin tear R arm noted from recent admission)  Last BM:  7/14 type 6  Height:   Ht Readings from Last 1 Encounters:  01/03/21 _0  (1.727 m)    Weight:   Wt Readings from Last 1 Encounters:  01/14/21 103.3 kg    Ideal Body Weight:  63.63 kg  BMI:  Body mass index is 34.63 kg/m.  Estimated Nutritional Needs:   Kcal:  3220-2542  Protein:  110-130 gm  Fluid:  >/= 1.8 L    Lucas Mallow, RD, LDN, CNSC Please refer to Amion for contact information.

## 2021-01-15 DIAGNOSIS — J9621 Acute and chronic respiratory failure with hypoxia: Secondary | ICD-10-CM | POA: Diagnosis not present

## 2021-01-15 DIAGNOSIS — J9622 Acute and chronic respiratory failure with hypercapnia: Secondary | ICD-10-CM | POA: Diagnosis not present

## 2021-01-15 DIAGNOSIS — J9602 Acute respiratory failure with hypercapnia: Secondary | ICD-10-CM | POA: Diagnosis not present

## 2021-01-15 DIAGNOSIS — J9601 Acute respiratory failure with hypoxia: Secondary | ICD-10-CM | POA: Diagnosis not present

## 2021-01-15 LAB — BASIC METABOLIC PANEL
Anion gap: 8 (ref 5–15)
BUN: 35 mg/dL — ABNORMAL HIGH (ref 8–23)
CO2: 24 mmol/L (ref 22–32)
Calcium: 8.8 mg/dL — ABNORMAL LOW (ref 8.9–10.3)
Chloride: 112 mmol/L — ABNORMAL HIGH (ref 98–111)
Creatinine, Ser: 0.92 mg/dL (ref 0.44–1.00)
GFR, Estimated: >60 mL/min (ref >60)
Glucose, Bld: 144 mg/dL — ABNORMAL HIGH (ref 70–99)
Potassium: 4.1 mmol/L (ref 3.5–5.1)
Sodium: 144 mmol/L (ref 135–145)

## 2021-01-15 LAB — GLUCOSE, CAPILLARY
Glucose-Capillary: 127 mg/dL — ABNORMAL HIGH (ref 70–99)
Glucose-Capillary: 94 mg/dL (ref 70–99)
Glucose-Capillary: 160 mg/dL — ABNORMAL HIGH (ref 70–99)
Glucose-Capillary: 213 mg/dL — ABNORMAL HIGH (ref 70–99)
Glucose-Capillary: 209 mg/dL — ABNORMAL HIGH (ref 70–99)
Glucose-Capillary: 221 mg/dL — ABNORMAL HIGH (ref 70–99)

## 2021-01-15 IMAGING — DX DG CHEST 2V
2 series · 2 of 2 positions shown · non-contrast
Comparison: 08/25/2018 chest radiograph

CLINICAL DATA: 67 y/o F; shortness of breath, dizziness,
personality changes.

EXAM:
CHEST - 2 VIEW

[chest pa]
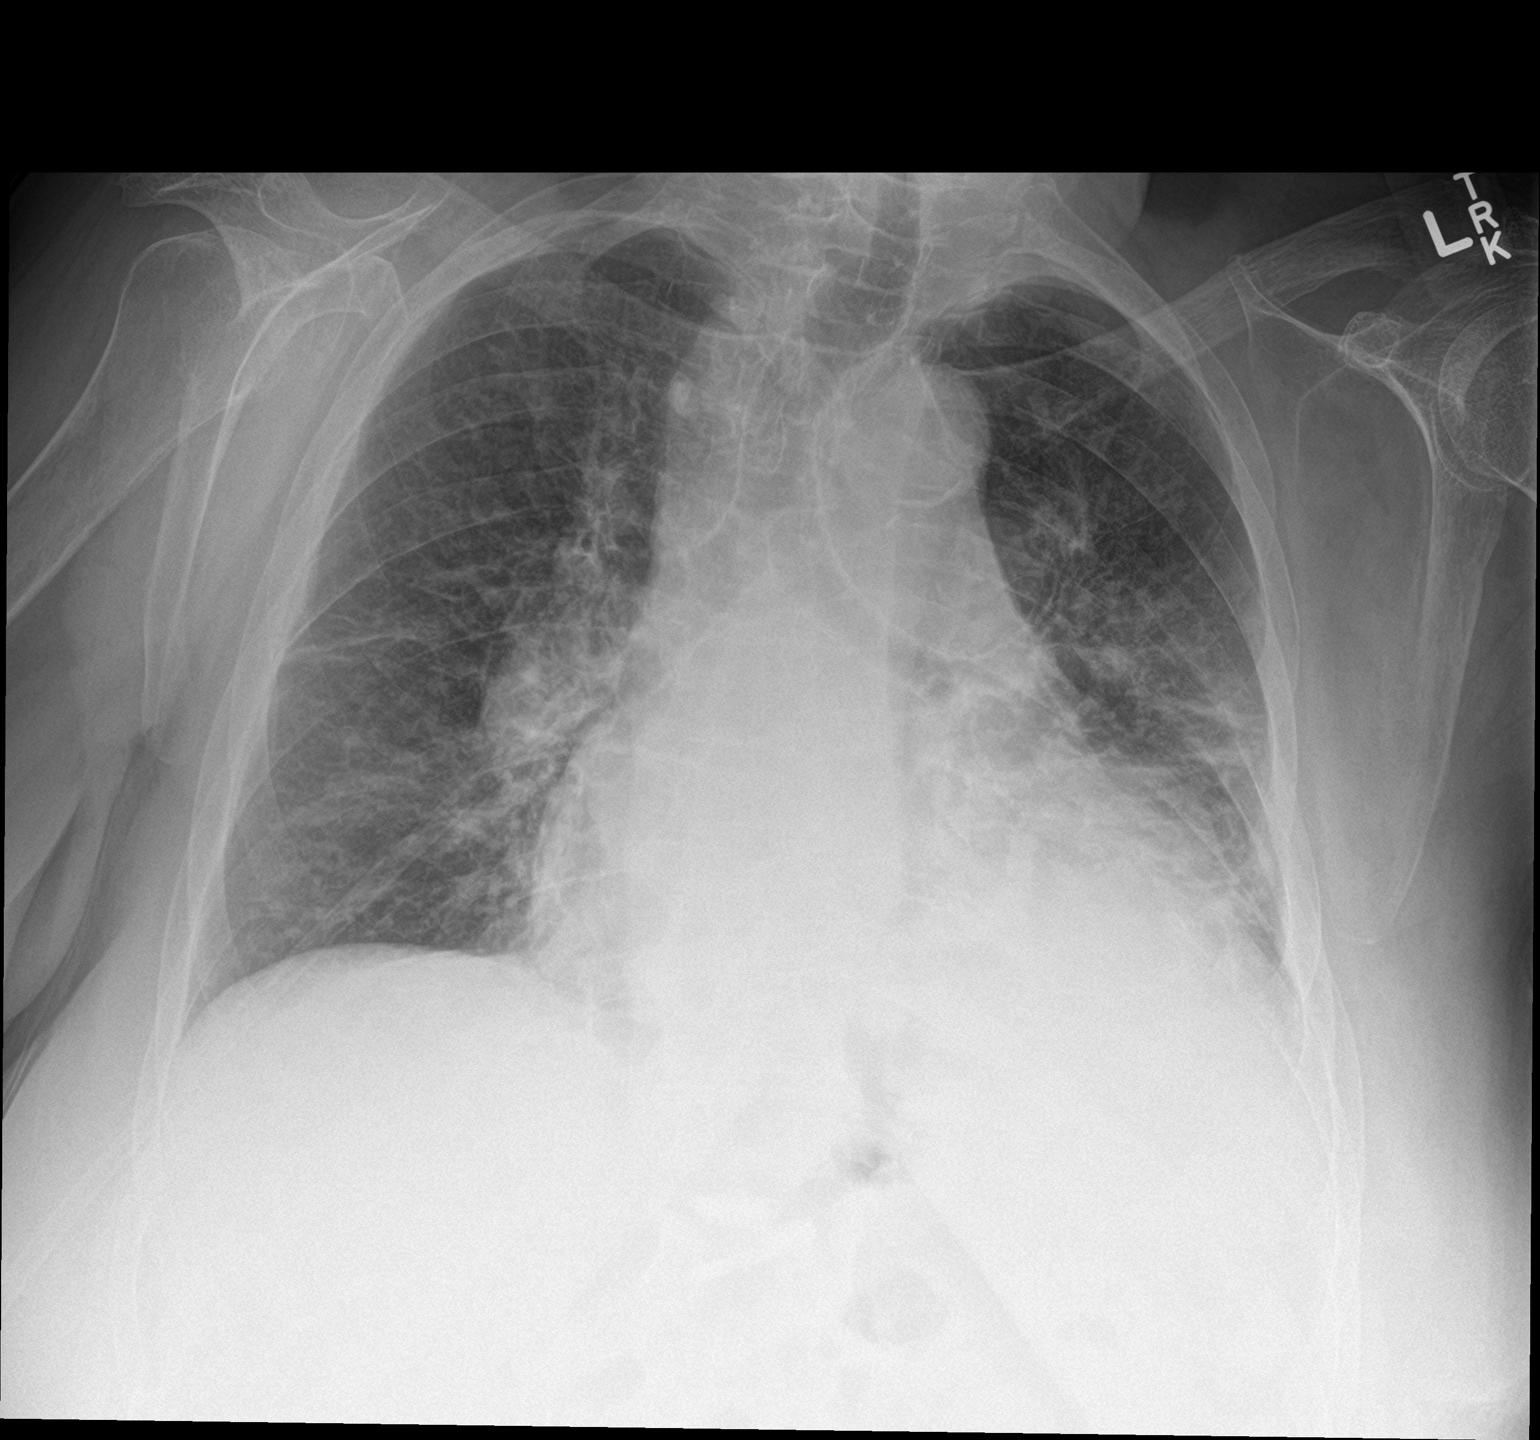

[chest lat]
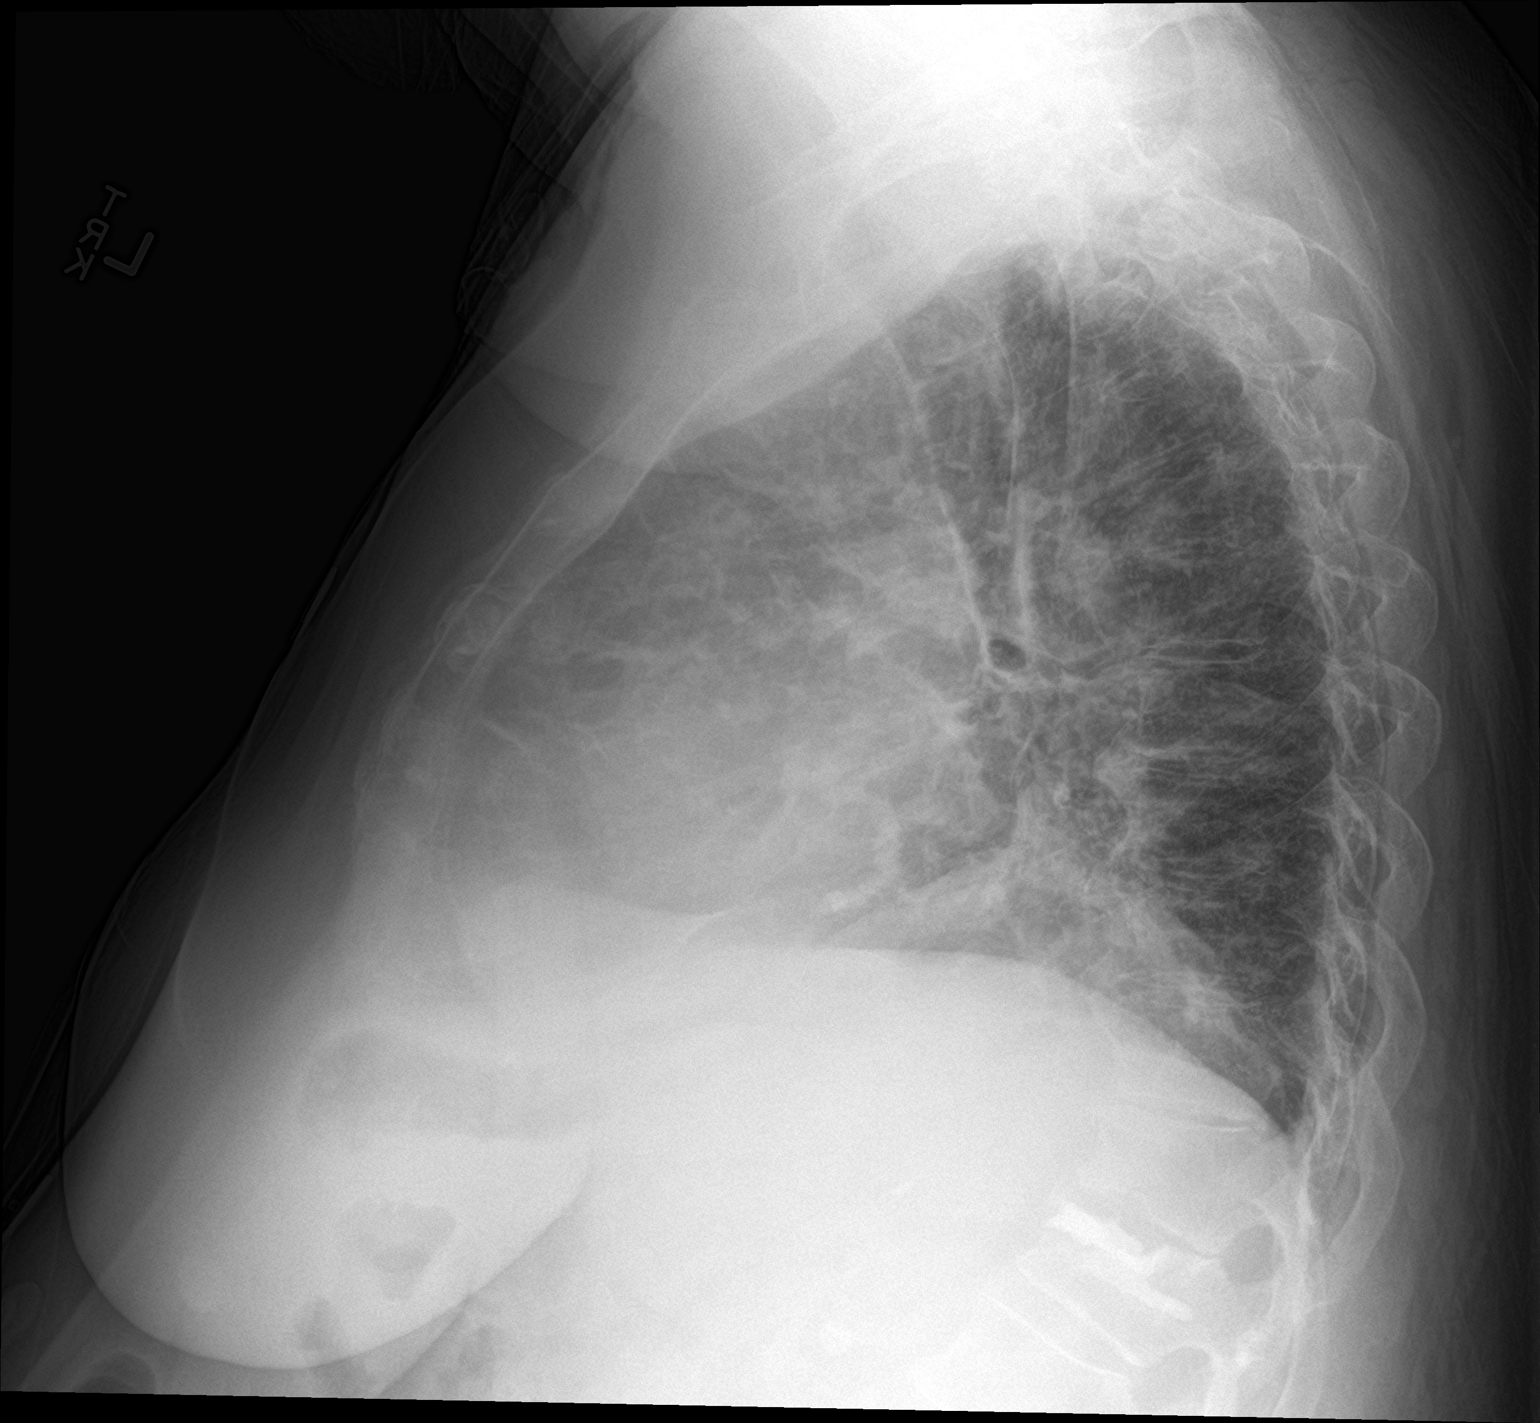

[2 of 2 positions shown; findings below may reference images not displayed]

FINDINGS: Increasing consolidation in the left lung base. Stable background of
interstitial opacities probably representing vascular congestion.
Stable cardiomegaly given projection and technique. Aortic calcific
atherosclerosis. Stable thoracolumbar mild compression deformities
post augmentation. No acute osseous abnormality is evident.
IMPRESSION: Increasing consolidation in the left lung base, suspected pneumonia.
Stable background of interstitial opacities, probably vascular
congestion.

## 2021-01-15 MED ORDER — MELATONIN 3 MG PO TABS
3.0000 mg | ORAL_TABLET | Freq: Every evening | ORAL | Status: AC | PRN
  Administered 2021-01-15 – 2021-01-17 (×3): 3 mg
  Filled 2021-01-15 (×3): qty 1

## 2021-01-15 MED ORDER — CHLORHEXIDINE GLUCONATE 0.12 % MT SOLN
OROMUCOSAL | Status: AC
  Filled 2021-01-15: qty 15

## 2021-01-15 NOTE — Evaluation (Signed)
[facility] Speaking [facility]ve - Evaluation Patient Details  Name: [name] MRN: [id number] Date of Birth: 12-24-1950  Today's Date: 01/15/2021 Time: 1110-1135 SLP Time Calculation (min) (ACUTE ONLY): 25 min  Past Medical History:  Past Medical History:  Diagnosis Date   Abnormal pulmonary function test    Anemia    H/H of 10/30 with a normal MCV in 12/09   Anxiety    Arthritis    Barrett's esophagus    Diagnosed 1995. Last EGD 2016-NO BARRETT'S.    Chest pain    Negative cardiac catheterization in 2002; negative stress nuclear study in 2008   Chronic anticoagulation    Chronic combined systolic and diastolic CHF (congestive heart failure) (HCC)    a. EF predominantly normal during prior echoes but was 40% during 10/2014 echo. b. Most recent 01/2015 EF normal, 55-60%.   Chronic LBP    Surgical intervention in 1996   Diabetes mellitus, type 2 (HCC)    Insulin therapy; exacerbated by prednisone   Dysrhythmia    AFib   Gastroparesis    99% retention 05/2008 on GES   GERD (gastroesophageal reflux disease)    Heart attack (HCC) 08/13/2020   Hiatal hernia    Hyperlipidemia    Hypertension    Hypothyroid    IBS (irritable bowel syndrome)    Obesity    OSA on CPAP    had CPAP and cannot tolerate.   Paroxysmal atrial fibrillation (HCC)    Pulmonary hypertension (HCC) 01/2015   a. Predominantly pulmonary venous hypertension but may be component of PAH.   Seizures (HCC)    last seizure was 2 years ago; on keppra for this; unknown etiology   Syncope    a. Admitted 05/2009; magnetic resonance imagin/ MRA - negative; etiology thought to be orthostasis secondary to drugs and dehydration. b. Syncope 02/2015 also felt 2/2 dehydration.   Past Surgical History:  Past Surgical History:  Procedure Laterality Date   BACK SURGERY  1996   BIOPSY N/A 11/08/2013   Procedure: BIOPSY  / Tissue sampling / ulcers present in small intestine;  Surgeon: West Bali, MD;  [facility];   Service: Endoscopy;  Laterality: N/A;   CARDIAC CATHETERIZATION  2002   CARDIAC CATHETERIZATION N/A 01/26/2015   Procedure: Right Heart Cath;  Surgeon: Laurey Morale, MD;  Location: [facility] INVASIVE CV LAB;  Service: Cardiovascular;  Laterality: N/A;   CARDIOVASCULAR STRESS TEST  2008   Stress nuclear study   CARDIOVERSION N/A 03/06/2015   Procedure: CARDIOVERSION;  Surgeon: Laurey Morale, MD;  Location: [facility] ENDOSCOPY;  Service: Cardiovascular;  Laterality: N/A;   CARPAL TUNNEL RELEASE  1994   COLONOSCOPY  11/2011   Dr. Darrick Penna: Internal hemorrhoids, mild diverticulosis. Random colon biopsies negative.   COLONOSCOPY N/A 11/08/2013   SLF: Normal mucosa in the terminal ileum/The colon IS redundant/  Moderate diverticulosis throughout the entire colon. ileum bx benign. colon bx benign   ESOPHAGOGASTRODUODENOSCOPY  2008   Barrett's without dysplasia. esphagus dilated. antral erosions, h.pylori serologies negative.   ESOPHAGOGASTRODUODENOSCOPY  11/2011   Dr. Darrick Penna: Barrett's esophagus, mild gastritis, diverticulum in the second portion of the duodenum repeat EGD 3 years. Small bowel biopsies negative. Gastric biopsy show reactive gastropathy but no H. pylori. Esophageal biopsies consistent with GERD. Next EGD 11/2014   ESOPHAGOGASTRODUODENOSCOPY N/A 11/21/2014   FHL:KTGY non-erosive gastritis/irregular z-line   GIVENS CAPSULE STUDY  12/07/2011   Proximal small bowel, rare AVM. Distal small bowel, multiple ulcers noted   GIVENS  CAPSULE STUDY N/A 09/27/2013   Distal small bowel ulcers extending to TI.   GIVENS CAPSULE STUDY N/A 10/10/2013   Procedure: GIVENS CAPSULE STUDY;  Surgeon: West Bali, MD;  Location: [facility];  Service: Endoscopy;  Laterality: N/A;  7:30   IR GASTROSTOMY TUBE MOD SED  01/11/2021   IR KYPHO THORACIC WITH BONE BIOPSY  02/09/2018   KNEE ARTHROSCOPY WITH MEDIAL MENISECTOMY Right 06/09/2016   Procedure: KNEE ARTHROSCOPY WITH MEDIAL MENISECTOMY;  Surgeon: Vickki Hearing, MD;   Location: [facility] ORS;  Service: Orthopedics;  Laterality: Right;  medial and lateral menisectomy   LAMINECTOMY  1995   L4-L5   LAPAROSCOPIC CHOLECYSTECTOMY  1990s   LEFT HEART CATHETERIZATION WITH CORONARY ANGIOGRAM  01/10/2014   Procedure: LEFT HEART CATHETERIZATION WITH CORONARY ANGIOGRAM;  Surgeon: Lesleigh Noe, MD;  Location: [facility] CATH LAB;  Service: Cardiovascular;;   RIGHT HEART CATHETERIZATION N/A 01/10/2014   Procedure: RIGHT HEART CATH;  Surgeon: Lesleigh Noe, MD;  Location: [facility] CATH LAB;  Service: Cardiovascular;  Laterality: N/A;   TOTAL ABDOMINAL HYSTERECTOMY  1999   TRACHEOSTOMY TUBE PLACEMENT N/A 09/01/2017   Procedure: TRACHEOSTOMY;  Surgeon: Drema Halon, MD;  Location: [facility] OR;  Service: ENT;  Laterality: N/A;   HPI:  70 yo female presented to [facility] on 7/03 with dyspnea and altered mental status.  Transferred to [facility]; failed BiPAP and required intubation. 7/7 trach; 7/11 PEG. Dx acute on chronic hypoxic/hypercapnic respiratory failure from acute pulmonary edema, pneumonia; severe deconditioning; metabolic encephalopathy.  Had admission from 12/21/20 to 12/30/20 for respiratory failure and CHF exacerbation. PMHx: Kyphoscoliosis, Restrictive lung disease, Chronic respiratory failure on 2 liters, DM type 2, Chronic a fib on eliquis, Dementia, Chronic combined CHF, Hypothyroidism, Seizures, CKD 3a, CAD s/p NSTEMI, HLD, GERD.   Assessment / Plan / Recommendation Clinical Impression  Pt participated in [facility] evaluation and did very well.  Transitioned from vent to ATC a few minutes before assessment and was sitting in recliner.  At baseline, HR was 107, Sp02 98%, RR 20.  Cuff was deflated - there were no changes in VS.  PMV placed for intervals of 2-3 breath cycles - no indication of air trapping upon removal.  After coughing and attempting to vocalize, pt was able to achieve enough drive to begin talking functionally (counted, named DOW, spoke to her daughter and responded to some  orientation questions).  When asked how she was feeling, she said "I feel like me." Excellent toleration overall with stable VS throughout use.    Pt began to c/o nausea and being tired.  Removed valve and reinflated cuff for safety. Recommend pt use PMV as much as able when on TC; cuff must be deflated.  D/W daughter, [name], at bedside and Alycia Rossetti and [place], RNs.  SLP will follow for PMV use and swallow assessment when appropriate.       SLP Visit Diagnosis: Aphonia (R49.1)    SLP Assessment  Patient needs continued Speech Lanaguage Pathology Services    Follow Up Recommendations  24 hour supervision/assistance    Frequency and Duration min 2x/week  2 weeks    PMSV Trial PMSV was placed for:  (20 min at time of writing) Able to redirect subglottic air through upper airway: Yes Able to Attain Phonation: Yes Voice Quality: Low vocal intensity Able to Expectorate Secretions: Yes Level of Secretion Expectoration with PMSV: Oral Breath Support for Phonation: Mildly decreased Intelligibility: Intelligibility reduced Word: 75-100% accurate Phrase: 75-100% accurate Respirations During  Trial: 18 SpO2 During Trial: 97 % Pulse During Trial: 110   Tracheostomy Tube  Additional Tracheostomy Tube Assessment Fenestrated: No    Vent Dependency  Vent Mode: PRVC Set Rate: 20 bmp PEEP: 5 cmH20 FiO2 (%): 35 % Vt Set: 510 mL    Cuff Deflation Trial  GO Tolerated Cuff Deflation: Yes Length of Time for Cuff Deflation Trial: 20 min Behavior: Alert;Cooperative        Blenda Mounts Laurice 01/15/2021, 11:36 AM Jill Side. Samson Frederic, MA CCC/SLP Acute Rehabilitation Services Office number [phone] Pager [phone]

## 2021-01-15 NOTE — Progress Notes (Signed)
Pt placed on Trach collar 8L 35%  per MD. Pt tolerating well, vitals are stable, SpO2 100%, RN at bedside.

## 2021-01-15 NOTE — Progress Notes (Addendum)
NAME:  [name], MRN:  [id number], DOB:  03/04/1951, LOS: 12 ADMISSION DATE:  01/03/2021, CONSULTATION DATE:  01/03/2021 REFERRING MD:  EDP - [facility] CHIEF COMPLAINT:  SOB, encephalopathy   History of Present Illness:  70 yo female presented to [facility] on 7/03 with dyspnea, and altered mental status.  Transferred to [facility]. SpO2 65% and elevated PCO2.  Failed Bipap and required intubation.  Had admission from 12/21/20 to 12/30/20 for respiratory failure and CHF exacerbation.  Pertinent  Medical History  Kyphoscoliosis, Restrictive lung disease, Chronic respiratory failure on 2 liters, DM type 2, Chronic a fib on eliquis, Dementia, Chronic combined CHF, Hypothyroidism, Seizures, CKD 3a, CAD s/p NSTEMI, HLD, GERD  Significant Hospital Events:   7/3  chronic HCRF from OHS and restrictive lung dz as well as deconditioning-->,progression -->decompensated HFpEF and cor pulmonale-->worsening metabolic enceph from HCRF-->PNA --> acute on chronic resp failure. Admitted. Intubated. Ceftriaxone 7/3 x 1. Azith and Cefepime started.  7/4-7/5 Hypotension overnight. A line placed. LR bolus. Started on NE. RUE PICC placed 7/6 Hgb 6.7 (7.4), CT A/P completed to r/o RP bleed (negative), showed mild peripancreatic stranding. Lipase slightly elevated. 1U PRBCs ordered. 7/7 Tracheostomy placed, heparin gtt held for procedure. 7/8 Increased agitation with Propofol wean, increased HR, uptrending pressor requirements. Cortrak placed. Azith and cefepime stopped.  7/11 PEG tube placed, tolerating PSV 7/12 ATC trials started. Tubefeeds started/resumed 7/13 solucortef decreased to daily. Midodrine stopped. Heparin gtt stopped. Stated back on DOAC. PANDA removed 7/14: sat at side of bed w/ max assist for 15 minutes. Fatigued quickly. PT recommending LTAC setting    Interim History / Subjective:  Appears comfortable. A little sleepy today Got Melatonin last night   Objective:  Blood pressure 112/69, pulse 74, temperature  99 F (37.2 C), temperature source Axillary, resp. rate 20, height 5\' 8"  (1.727 m), weight 107 kg, SpO2 92 %.    Vent Mode: PRVC FiO2 (%):  [30 %] 30 % Set Rate:  [20 bmp] 20 bmp Vt Set:  [510 mL] 510 mL PEEP:  [5 cmH20] 5 cmH20 Pressure Support:  [10 cmH20] 10 cmH20 Plateau Pressure:  [14 cmH20-23 cmH20] 19 cmH20   Intake/Output Summary (Last 24 hours) at 01/15/2021 0735 Last data filed at 01/15/2021 0600 Gross per 24 hour  Intake 890 ml  Output 1350 ml  Net -460 ml   Filed Weights   01/13/21 1000 01/14/21 0417 01/15/21 0500  Weight: 104.4 kg 103.3 kg 107 kg   Physical Examination: General this is a 70 year old female. She is resting in bed. She is sleepy this am but not in distress HENT 6 cuffed trach. Minimal secretions. Sutures now removed. No airleak.  Pulm currently on full support. No accessory use. Some scattered rhonchi Card RRR Abd PEG is soft. + bowel sounds. Tolerating bolus feeds Ext dependent edema. Warm pulses are strong CR warm GU external UD w/ clear yellow Neuro groggy. Wakes up w/ verbal request. No focal defs  Resolved Hospital Problem List   Septic shock from pneumonia Left upper extremity redness and swelling resolved Assessment & Plan:   Acute on chronic hypoxic/hypercapnic respiratory failure from acute pulmonary edema, pneumonia. Restrictive lung disease from kyphoscolosis, sleep disordered breathing (mix of OHS and OSA) Failure to wean from vent s/p tracheostomy on 01/07/21. Endurance and fatigue are largest barriers for progress here. I do think that she will get to a baseline of ATC day w/ nocturnal night support. Family is very determined to get her home.  Plan Cont full vent support at night and at time for rest Encourage ATC trial DAILY  and also PSV  if fatigues on ATC Cont routine trach care and family teaching on trach care RASS goal 0 Cont VAP bundle OOB SLP consulted   Relative adrenal insufficiency. Plan Solucortef stop date  placed   Acute metabolic encephalopathy 2nd to sepsis and hypercapnia. Hx of CVA, seizures, dementia, depression. Plan Continue seizure precautions.  Keppra and Neurontin Supportive care  Severe Physical deconditioning Plan Working w/ PT. They have recommended LTAC   Chronic permanent atrial fibrillation. Acute on chronic combined CHF: -ECHO 45-50% with global hypokinesis  HLD. Cardiology evaluated during admission, now signed off Plan No change: cont amiodarone, statin and DOAC On tele   CKD 3a. Scr stable  Plan Avoid nephro toxins Intermittent chem   Intermittent Fluid and electrolyte imbalance:  -got K replacement 7/14 Plan Intermittent chem checks   DM type 2 poorly controlled with hyperglycemia. Glycemic control has been challenging. Adjusted bolus time short acting 7/14. Looks like this has helped.  Plan Cont Lantus at 20 daily Cont SSI every 4 and bolus time coverage (10 units @ 8,12,4 and 8) May need to adjust her coverage as we taper steroids.   Anemia of critical illness and chronic disease. No evidence of bleeding Plan Intermittent CBC  Hypothyroidism. Plan Synthroid via tube  Dysphagia post PEG placement Plan Bolus tube feeds SLP consulted.  Eventually will be reasonable to assess swallow    Best Practice (right click and "Reselect all SmartList Selections" daily)   Diet/type: tubefeeds DVT prophylaxis: DOAC GI prophylaxis: PPI Lines: Central line and yes and it is still needed Has PICC RUE  Foley:  N/A Code Status:  full code   Last date of multidisciplinary goals of care discussion [7/4] Disposition: family agreeable to LTAC placement and social work looking into [facility] Updated pt's daughter at bedside o 7/14   My cct 32 min   Simonne Martinet ACNP-BC [facility] Pulmonary/Critical Care Pager # [phone] OR # [phone] if no answer

## 2021-01-16 DIAGNOSIS — J9601 Acute respiratory failure with hypoxia: Secondary | ICD-10-CM | POA: Diagnosis not present

## 2021-01-16 DIAGNOSIS — Z930 Tracheostomy status: Secondary | ICD-10-CM | POA: Diagnosis not present

## 2021-01-16 DIAGNOSIS — J9602 Acute respiratory failure with hypercapnia: Secondary | ICD-10-CM | POA: Diagnosis not present

## 2021-01-16 DIAGNOSIS — J9621 Acute and chronic respiratory failure with hypoxia: Secondary | ICD-10-CM | POA: Diagnosis not present

## 2021-01-16 LAB — GLUCOSE, CAPILLARY
Glucose-Capillary: 107 mg/dL — ABNORMAL HIGH (ref 70–99)
Glucose-Capillary: 107 mg/dL — ABNORMAL HIGH (ref 70–99)
Glucose-Capillary: 114 mg/dL — ABNORMAL HIGH (ref 70–99)
Glucose-Capillary: 121 mg/dL — ABNORMAL HIGH (ref 70–99)
Glucose-Capillary: 122 mg/dL — ABNORMAL HIGH (ref 70–99)
Glucose-Capillary: 169 mg/dL — ABNORMAL HIGH (ref 70–99)
Glucose-Capillary: 80 mg/dL (ref 70–99)
Glucose-Capillary: 89 mg/dL (ref 70–99)

## 2021-01-16 MED ORDER — FENTANYL CITRATE (PF) 100 MCG/2ML IJ SOLN: 25.0000 ug | INTRAMUSCULAR | Status: DC | PRN

## 2021-01-16 MED ORDER — POLYETHYLENE GLYCOL 3350 17 G PO PACK
17.0000 g | PACK | Freq: Every day | ORAL | Status: DC
  Administered 2021-01-17 – 2021-01-18 (×2): 17 g
  Filled 2021-01-16 (×2): qty 1

## 2021-01-16 MED ORDER — FENTANYL CITRATE (PF) 100 MCG/2ML IJ SOLN
25.0000 ug | INTRAMUSCULAR | Status: DC | PRN
Start: 2021-01-16 — End: 2021-01-18
  Administered 2021-01-16 – 2021-01-17 (×2): 100 ug via INTRAVENOUS
  Administered 2021-01-17: 25 ug via INTRAVENOUS
  Administered 2021-01-17 – 2021-01-18 (×3): 100 ug via INTRAVENOUS
  Filled 2021-01-16 (×6): qty 2

## 2021-01-16 MED ORDER — DOCUSATE SODIUM 50 MG/5ML PO LIQD
100.0000 mg | Freq: Two times a day (BID) | ORAL | Status: DC
  Administered 2021-01-16 – 2021-01-19 (×6): 100 mg
  Filled 2021-01-16 (×7): qty 10

## 2021-01-16 NOTE — Progress Notes (Signed)
Pt failed TC trial D/T increased RR and low SpO2.

## 2021-01-16 NOTE — Progress Notes (Signed)
NAME:  [name], MRN:  [id number], DOB:  Jun 29, 1951, LOS: 13 ADMISSION DATE:  01/03/2021, CONSULTATION DATE:  01/03/2021 REFERRING MD:  EDP - [facility] CHIEF COMPLAINT:  SOB, encephalopathy   History of Present Illness:  70 yo female presented to [facility] on 7/03 with dyspnea, and altered mental status.  Transferred to [facility]. SpO2 65% and elevated PCO2.  Failed Bipap and required intubation.  Had admission from 12/21/20 to 12/30/20 for respiratory failure and CHF exacerbation.  Pertinent  Medical History  Kyphoscoliosis, Restrictive lung disease, Chronic respiratory failure on 2 liters, DM type 2, Chronic a fib on eliquis, Dementia, Chronic combined CHF, Hypothyroidism, Seizures, CKD 3a, CAD s/p NSTEMI, HLD, GERD  Significant Hospital Events:   7/3  chronic HCRF from OHS and restrictive lung dz as well as deconditioning-->,progression -->decompensated HFpEF and cor pulmonale-->worsening metabolic enceph from HCRF-->PNA --> acute on chronic resp failure. Admitted. Intubated. Ceftriaxone 7/3 x 1. Azith and Cefepime started.  7/4-7/5 Hypotension overnight. A line placed. LR bolus. Started on NE. RUE PICC placed 7/6 Hgb 6.7 (7.4), CT A/P completed to r/o RP bleed (negative), showed mild peripancreatic stranding. Lipase slightly elevated. 1U PRBCs ordered. 7/7 Tracheostomy placed, heparin gtt held for procedure. 7/8 Increased agitation with Propofol wean, increased HR, uptrending pressor requirements. Cortrak placed. Azith and cefepime stopped.  7/11 PEG tube placed, tolerating PSV 7/12 ATC trials started. Tubefeeds started/resumed 7/13 solucortef decreased to daily. Midodrine stopped. Heparin gtt stopped. Stated back on DOAC. PANDA removed 7/14: sat at side of bed w/ max assist for 15 minutes. Fatigued quickly. PT recommending LTAC setting 7/15: Tolerated trach collar yesterday    Interim History / Subjective:  Tolerated trach collar yesterday. Returned to vent overnight  [employer] [facility]  denied. Attempted to call for peer to peer but no answer. Objective:  Blood pressure (!) 164/95, pulse (!) 108, temperature 98.2 F (36.8 C), temperature source Oral, resp. rate 19, height 5\' 8"  (1.727 m), weight 103 kg, SpO2 93 %.    Vent Mode: PRVC FiO2 (%):  [30 %-35 %] 30 % Set Rate:  [20 bmp] 20 bmp Vt Set:  [443 mL-510 mL] 510 mL PEEP:  [5 cmH20] 5 cmH20 Plateau Pressure:  [8 cmH20-20 cmH20] 8 cmH20   Intake/Output Summary (Last 24 hours) at 01/16/2021 1226 Last data filed at 01/16/2021 1052 Gross per 24 hour  Intake 760 ml  Output 1440 ml  Net -680 ml   Filed Weights   01/14/21 0417 01/15/21 0500 01/16/21 0500  Weight: 103.3 kg 107 kg 103 kg   Physical Exam: General: Morbidly obese, chronically ill-appearing, no acute distress HENT: [place], AT Neck: Cuffed #6 trach in place, c/d/i Eyes: EOMI, no scleral icterus Respiratory: Diminished breath sounds bilaterally.  No crackles, wheezing or rales Cardiovascular: RRR, -M/R/G, no JVD GI: BS+, soft, nontender, PEG Extremities:-Edema,-tenderness Neuro: Awake and alert, CNII-XII grossly intact, follows commands Skin: Intact, no rashes or bruising Psych: Normal mood, normal affect GU: [place] in place   Resolved Hospital Problem List   Septic shock from pneumonia Left upper extremity redness and swelling resolved Assessment & Plan:   Acute on chronic hypoxic/hypercapnic respiratory failure from acute pulmonary edema, pneumonia. Restrictive lung disease from kyphoscolosis, sleep disordered breathing (mix of OHS and OSA) Failure to wean from vent s/p tracheostomy on 01/07/21. Endurance and fatigue are largest barriers for progress here. I do think that she will get to a baseline of ATC day w/ nocturnal night support. Family is very determined to get her  home.  Plan Continues to require full vent support at night at this time Encourage ATC trial dial and also PSV if fatigues on ATC Routine trach care PO diuresis VAP OOB SLP  consulted  Relative adrenal insufficiency. Plan Weaned off steroids  Acute metabolic encephalopathy 2nd to sepsis and hypercapnia. Hx of CVA, seizures, dementia, depression. Plan Continue seizure precautions.  Keppra and Neurontin Supportive care  Severe Physical deconditioning Plan Working w/ PT. They have recommended LTAC  Chronic permanent atrial fibrillation. Acute on chronic combined CHF: -ECHO 45-50% with global hypokinesis  HLD. Cardiology evaluated during admission, now signed off Plan No change: cont amiodarone, statin and DOAC On tele   CKD 3a. Scr stable  Plan Avoid nephro toxins Intermittent chem   Intermittent Fluid and electrolyte imbalance:  -got K replacement 7/14 Plan Intermittent chem checks   DM type 2 poorly controlled with hyperglycemia. Glycemic control has been challenging. Adjusted bolus time short acting 7/14. Looks like this has helped.  Plan Cont Lantus at 20 daily Cont SSI every 4 and bolus time coverage (10 units @ 8,12,4 and 8) May need to adjust her coverage as we taper steroids.   Anemia of critical illness and chronic disease. No evidence of bleeding Plan Intermittent CBC  Hypothyroidism. Plan Synthroid via tube  Dysphagia post PEG placement Plan Bolus tube feeds SLP consulted.  Eventually will be reasonable to assess swallow    Best Practice (right click and "Reselect all SmartList Selections" daily)   Diet/type: tubefeeds DVT prophylaxis: DOAC GI prophylaxis: PPI Lines: Central line and yes and it is still needed Has PICC RUE  Foley:  N/A Code Status:  full code   Last date of multidisciplinary goals of care discussion [7/4] Disposition: family agreeable to LTAC placement and social work looking into [facility] Updated pt's daughter at bedside o 7/15   The patient is critically ill with multiple organ systems failure and requires high complexity decision making for assessment and support, frequent evaluation and  titration of therapies, application of advanced monitoring technologies and extensive interpretation of multiple databases.  Independent Critical Care Time: 35 Minutes.   [name], M.D. [facility] Pulmonary/Critical Care Medicine 01/16/2021 12:47 PM   Please see Amion for pager number to reach on-call Pulmonary and Critical Care Team.

## 2021-01-16 NOTE — Care Management (Signed)
Made aware by Dr. Everardo All and [facility] that LTAC was denied after peer 2 peer. Received denial letter via fax. Went to bedside and spoke with patient's daughter [name] to make aware of the denial to LTAC. Also gave copy of denial letter with details to [facility].  Left voicemail for Jennifier with [facility] to make aware of denial.   Left copy of denial in patient's chart.  Nursing made aware as well.    [name], MSN, RN,BSN Inpatient [facility] Case Manager [phone]

## 2021-01-16 NOTE — Progress Notes (Signed)
PCCM Progress Note  Called to bedside for cuff leak. Evaluated vent at bedside with expiratory TV < inspiratory TV consistent with cuffleak. Discussed with RN and RT. Plan for bedside trach exchange.

## 2021-01-16 NOTE — Care Management (Addendum)
Received telephone call from [place], RN from [facility] [employer] stating LTAC has been denied.   Made Dr. Everardo All and patient's nurse aware that LTAC has been denied. Provided Dr. Everardo All with [facility] MD contact information for peer to peer. Peer to peer has to be done today per [facility].    [name], MSN, RN,BSN Inpatient [facility] Case Manager [phone]     PEER TO PEER completed. Denied due to multiple hospitalizations at [facility] and [facility] this year with short intervals between stays. Patient thought to likely require nocturnal ventilation and not be able to return to independent baseline. I contested that patient is tolerating trach collar trials in the last 48 hours. I was advised by [employer] to consider resubmission if patient clinically improves however does not guarantee approval.  [name], M.D. [facility] Pulmonary/Critical Care Medicine 01/16/2021 1:09 PM   See Amion for personal pager For hours between 7 PM to 7 AM, please call [place] for urgent questions

## 2021-01-16 NOTE — Procedures (Signed)
Tracheostomy Change Note  Patient Details:   Name: [name] DOB: [birth year] MRN: [id number]    Airway Documentation:     Evaluation  O2 sats: stable throughout Complications: No apparent complications Patient did tolerate procedure well. Bilateral Breath Sounds: Rhonchi  Pt trach changed from cuffed #6 Sh[place] to cuffed #6 [place] XLT proximal. Positive CO2 color change, bilateral breath sounds present, MD aware, RN at bedside, RT will continue to monitor.     Rosalita Levan 01/16/2021, 3:03 PM

## 2021-01-17 DIAGNOSIS — J9601 Acute respiratory failure with hypoxia: Secondary | ICD-10-CM | POA: Diagnosis not present

## 2021-01-17 DIAGNOSIS — J9602 Acute respiratory failure with hypercapnia: Secondary | ICD-10-CM | POA: Diagnosis not present

## 2021-01-17 DIAGNOSIS — J9622 Acute and chronic respiratory failure with hypercapnia: Secondary | ICD-10-CM | POA: Diagnosis not present

## 2021-01-17 DIAGNOSIS — J9621 Acute and chronic respiratory failure with hypoxia: Secondary | ICD-10-CM | POA: Diagnosis not present

## 2021-01-17 LAB — GLUCOSE, CAPILLARY
Glucose-Capillary: 119 mg/dL — ABNORMAL HIGH (ref 70–99)
Glucose-Capillary: 133 mg/dL — ABNORMAL HIGH (ref 70–99)
Glucose-Capillary: 148 mg/dL — ABNORMAL HIGH (ref 70–99)
Glucose-Capillary: 151 mg/dL — ABNORMAL HIGH (ref 70–99)
Glucose-Capillary: 168 mg/dL — ABNORMAL HIGH (ref 70–99)
Glucose-Capillary: 205 mg/dL — ABNORMAL HIGH (ref 70–99)
Glucose-Capillary: 90 mg/dL (ref 70–99)

## 2021-01-17 NOTE — Progress Notes (Signed)
NAME:  [name], MRN:  [id number], DOB:  February 03, 1951, LOS: 14 ADMISSION DATE:  01/03/2021, CONSULTATION DATE:  01/03/2021 REFERRING MD:  EDP - [facility] CHIEF COMPLAINT:  SOB, encephalopathy   History of Present Illness:  70 yo female presented to [facility] on 7/03 with dyspnea, and altered mental status.  Transferred to [facility]. SpO2 65% and elevated PCO2.  Failed Bipap and required intubation.  Had admission from 12/21/20 to 12/30/20 for respiratory failure and CHF exacerbation.  Pertinent  Medical History  Kyphoscoliosis, Restrictive lung disease, Chronic respiratory failure on 2 liters, DM type 2, Chronic a fib on eliquis, Dementia, Chronic combined CHF, Hypothyroidism, Seizures, CKD 3a, CAD s/p NSTEMI, HLD, GERD  Significant Hospital Events:   7/3  chronic HCRF from OHS and restrictive lung dz as well as deconditioning-->,progression -->decompensated HFpEF and cor pulmonale-->worsening metabolic enceph from HCRF-->PNA --> acute on chronic resp failure. Admitted. Intubated. Ceftriaxone 7/3 x 1. Azith and Cefepime started.  7/4-7/5 Hypotension overnight. A line placed. LR bolus. Started on NE. RUE PICC placed 7/6 Hgb 6.7 (7.4), CT A/P completed to r/o RP bleed (negative), showed mild peripancreatic stranding. Lipase slightly elevated. 1U PRBCs ordered. 7/7 Tracheostomy placed, heparin gtt held for procedure. 7/8 Increased agitation with Propofol wean, increased HR, uptrending pressor requirements. Cortrak placed. Azith and cefepime stopped.  7/11 PEG tube placed, tolerating PSV 7/12 ATC trials started. Tubefeeds started/resumed 7/13 solucortef decreased to daily. Midodrine stopped. Heparin gtt stopped. Stated back on DOAC. PANDA removed 7/14: sat at side of bed w/ max assist for 15 minutes. Fatigued quickly. PT recommending LTAC setting 7/15: Tolerated trach collar yesterday 7/16: TCT x 2 hours    Interim History / Subjective:  Tolerated trach collar two hours yesterday. Awake, alert Objective:   Blood pressure (!) 116/56, pulse 90, temperature 99.1 F (37.3 C), temperature source Oral, resp. rate 20, height 5\' 8"  (1.727 m), weight 105.3 kg, SpO2 95 %.    Vent Mode: PRVC FiO2 (%):  [28 %-30 %] 30 % Set Rate:  [20 bmp] 20 bmp Vt Set:  [510 mL] 510 mL PEEP:  [5 cmH20] 5 cmH20 Pressure Support:  [8 cmH20] 8 cmH20 Plateau Pressure:  [22 cmH20-26 cmH20] 23 cmH20   Intake/Output Summary (Last 24 hours) at 01/17/2021 2117 Last data filed at 01/17/2021 1600 Gross per 24 hour  Intake 1165 ml  Output 800 ml  Net 365 ml   Filed Weights   01/15/21 0500 01/16/21 0500 01/17/21 0404  Weight: 107 kg 103 kg 105.3 kg   Physical Exam: General: Morbidly obese, chronic ill-appearing, no acute distress HENT: [place], AT Neck: Cuffed #6 trach in place, c/d/i Eyes: EOMI, no scleral icterus Respiratory: Diminished breath sounds bilaterally.  No crackles, wheezing or rales Cardiovascular: RRR, -M/R/G, no JVD GI: BS+, soft, nontender, PEG Extremities:-Edema,-tenderness Neuro: Awake and alert, CNII-XII grossly intact, follows commands   Resolved Hospital Problem List   Septic shock from pneumonia Left upper extremity redness and swelling resolved Assessment & Plan:   Acute on chronic hypoxic/hypercapnic respiratory failure from acute pulmonary edema, pneumonia. Restrictive lung disease from kyphoscolosis, sleep disordered breathing (mix of OHS and OSA) Failure to wean from vent s/p tracheostomy on 01/07/21. Endurance and fatigue are largest barriers for progress here. I do think that she will get to a baseline of ATC day w/ nocturnal night support. Family is very determined to get her home.  Plan Full vent support Encourage ATC trial dial and also PSV if fatigues on ATC Routine trach care  PO diuresis VAP OOB SLP consulted  Relative adrenal insufficiency. Plan Weaned off steroids  Acute metabolic encephalopathy 2nd to sepsis and hypercapnia. Hx of CVA, seizures, dementia,  depression. Plan Continue seizure precautions.  Keppra and Neurontin Supportive care  Severe Physical deconditioning Plan Working w/ PT. They have recommended LTAC  Chronic permanent atrial fibrillation. Acute on chronic combined CHF: -ECHO 45-50% with global hypokinesis  HLD. Cardiology evaluated during admission, now signed off Plan No change: cont amiodarone, statin and DOAC On tele   CKD 3a. Scr stable  Plan Avoid nephro toxins Intermittent chem   Intermittent Fluid and electrolyte imbalance:  -got K replacement 7/14 Plan Intermittent chem checks   DM type 2 poorly controlled with hyperglycemia. Glycemic control has been challenging. Adjusted bolus time short acting 7/14. Looks like this has helped.  Plan Cont Lantus at 20 daily Cont SSI every 4 and bolus time coverage (10 units @ 8,12,4 and 8) May need to adjust her coverage as we taper steroids.   Anemia of critical illness and chronic disease. No evidence of bleeding Plan Intermittent CBC q72 hours  Hypothyroidism. Plan Synthroid via tube  Dysphagia post PEG placement Plan Bolus tube feeds SLP consulted.  Eventually will be reasonable to assess swallow    Best Practice (right click and "Reselect all SmartList Selections" daily)   Diet/type: tubefeeds DVT prophylaxis: DOAC GI prophylaxis: PPI Lines: Central line and yes and it is still needed Has PICC RUE  Foley:  N/A Code Status:  full code   Last date of multidisciplinary goals of care discussion [7/4] Disposition: ICU; will need to resubmit [facility] request when patient improving   The patient is critically ill with multiple organ systems failure and requires high complexity decision making for assessment and support, frequent evaluation and titration of therapies, application of advanced monitoring technologies and extensive interpretation of multiple databases.  Independent Critical Care Time: 32 Minutes.   [name], M.D. [facility]  Pulmonary/Critical Care Medicine 01/17/2021 9:17 PM   Please see Amion for pager number to reach on-call Pulmonary and Critical Care Team.

## 2021-01-18 ENCOUNTER — Inpatient Hospital Stay: Payer: Self-pay

## 2021-01-18 ENCOUNTER — Inpatient Hospital Stay (HOSPITAL_COMMUNITY): Payer: PPO

## 2021-01-18 DIAGNOSIS — J9621 Acute and chronic respiratory failure with hypoxia: Secondary | ICD-10-CM | POA: Diagnosis not present

## 2021-01-18 DIAGNOSIS — J9622 Acute and chronic respiratory failure with hypercapnia: Secondary | ICD-10-CM | POA: Diagnosis not present

## 2021-01-18 LAB — GLUCOSE, CAPILLARY
Glucose-Capillary: 133 mg/dL — ABNORMAL HIGH (ref 70–99)
Glucose-Capillary: 117 mg/dL — ABNORMAL HIGH (ref 70–99)
Glucose-Capillary: 127 mg/dL — ABNORMAL HIGH (ref 70–99)
Glucose-Capillary: 121 mg/dL — ABNORMAL HIGH (ref 70–99)
Glucose-Capillary: 145 mg/dL — ABNORMAL HIGH (ref 70–99)
Glucose-Capillary: 152 mg/dL — ABNORMAL HIGH (ref 70–99)

## 2021-01-18 LAB — BASIC METABOLIC PANEL
Anion gap: 6 (ref 5–15)
BUN: 32 mg/dL — ABNORMAL HIGH (ref 8–23)
CO2: 27 mmol/L (ref 22–32)
Calcium: 8.3 mg/dL — ABNORMAL LOW (ref 8.9–10.3)
Chloride: 112 mmol/L — ABNORMAL HIGH (ref 98–111)
Creatinine, Ser: 0.81 mg/dL (ref 0.44–1.00)
GFR, Estimated: >60 mL/min (ref >60)
Glucose, Bld: 141 mg/dL — ABNORMAL HIGH (ref 70–99)
Potassium: 4.2 mmol/L (ref 3.5–5.1)
Sodium: 145 mmol/L (ref 135–145)

## 2021-01-18 LAB — CBC
HCT: 22.9 % — ABNORMAL LOW (ref 36.0–46.0)
Hemoglobin: 6.8 g/dL — CL (ref 12.0–15.0)
MCH: 28.1 pg (ref 26.0–34.0)
MCHC: 29.7 g/dL — ABNORMAL LOW (ref 30.0–36.0)
MCV: 94.6 fL (ref 80.0–100.0)
Platelets: 245 10*3/uL (ref 150–400)
RBC: 2.42 MIL/uL — ABNORMAL LOW (ref 3.87–5.11)
RDW: 20 % — ABNORMAL HIGH (ref 11.5–15.5)
WBC: 9 10*3/uL (ref 4.0–10.5)
nRBC: 0.3 % — ABNORMAL HIGH (ref 0.0–0.2)

## 2021-01-18 LAB — HEMOGLOBIN AND HEMATOCRIT, BLOOD
HCT: 26.6 % — ABNORMAL LOW (ref 36.0–46.0)
Hemoglobin: 8.3 g/dL — ABNORMAL LOW (ref 12.0–15.0)

## 2021-01-18 LAB — PREPARE RBC (CROSSMATCH)

## 2021-01-18 MED ORDER — SODIUM CHLORIDE 0.9% IV SOLUTION: Freq: Once | INTRAVENOUS | Status: AC

## 2021-01-18 MED ORDER — SERTRALINE HCL 50 MG PO TABS
25.0000 mg | ORAL_TABLET | Freq: Every day | ORAL | Status: DC
  Administered 2021-01-18 – 2021-02-15 (×28): 25 mg
  Filled 2021-01-18 (×29): qty 1

## 2021-01-18 MED ORDER — INSULIN GLARGINE 100 UNIT/ML ~~LOC~~ SOLN
10.0000 [IU] | Freq: Every day | SUBCUTANEOUS | Status: DC
  Administered 2021-01-18 – 2021-02-01 (×13): 10 [IU] via SUBCUTANEOUS
  Filled 2021-01-18 (×16): qty 0.1

## 2021-01-18 NOTE — Progress Notes (Signed)
eLink Physician-Brief Progress Note Patient Name: LAKEA MITTELMAN DOB: 1951-02-27 MRN: [id number]   Date of Service  01/18/2021  HPI/Events of Note  Anemia - Hgb = 6.8.   eICU Interventions  Will transfuse 1 unit PRBC now.      Intervention Category Major Interventions: Other:  Luverta Korte Dennard Nip 01/18/2021, 4:05 AM

## 2021-01-18 NOTE — Progress Notes (Addendum)
When changing pts picc dressing, it was noted that exposed catheter was at the 5cm mark, per previous documentation, catheter was originally at 0 cm.  Dressing was intact, however concern for correct placement.  Spoke w/ CCM NP who requests iv team consult to assess.  Will put in order.

## 2021-01-18 NOTE — Progress Notes (Signed)
Consult for PICC placement after line found to be out 5cm. Discussed with Karen Chafe, RN. Requested CXR to ensure tip remains in SVC.

## 2021-01-18 NOTE — Progress Notes (Signed)
Pt has been on TC for ~45 min, has been tachypnic, tachycardic, spo2 in mid 80s. Attempted suctioning and repositioning w/ no improvement. Spoke w/ RT who has switched pt back to vent.

## 2021-01-18 NOTE — Progress Notes (Signed)
eLink Physician-Brief Progress Note Patient Name: [name] DOB: 02-20-1951 MRN: [id number]   Date of Service  01/18/2021  HPI/Events of Note  PICC line tip noted in brachiocephalic confluence per CXR.   IV team has been up to access the situation, they are able to exchange PICC tonight if you will write order to be exchanged to place in central position  eICU Interventions  Exchange PICC line to place tip in a central position.      Intervention Category Major Interventions: Other:  Lenell Antu 01/18/2021, 9:33 PM

## 2021-01-18 NOTE — Progress Notes (Signed)
Date and time results received: 01/18/21 0352 (use smartphrase ".now" to insert current time)  Test: Hemoglobin Critical Value: 6.8  Name of Provider Notified: Pola Corn

## 2021-01-18 NOTE — Progress Notes (Signed)
SLP Cancellation Note  Patient Details Name: [name] MRN: [id number] DOB: 1951/01/29   Cancelled treatment:       Reason Eval/Treat Not Completed: Patient not medically ready, pt currently on vent; will follow along for readiness for PMSV tx   Ardyth Gal MA, CCC-SLP Acute Rehabilitation Services   01/18/2021, 12:15 PM

## 2021-01-18 NOTE — Progress Notes (Signed)
NAME:  [name], MRN:  [id number], DOB:  June 28, 1951, LOS: 15 ADMISSION DATE:  01/03/2021, CONSULTATION DATE:  01/03/2021 REFERRING MD:  EDP - [facility] CHIEF COMPLAINT:  SOB, encephalopathy   History of Present Illness:  70 yo female presented to [facility] on 7/03 with dyspnea, and altered mental status.  Transferred to [facility]. SpO2 65% and elevated PCO2.  Failed Bipap and required intubation.  Had admission from 12/21/20 to 12/30/20 for respiratory failure and CHF exacerbation.  Pertinent  Medical History  Kyphoscoliosis, Restrictive lung disease, Chronic respiratory failure on 2 liters, DM type 2, Chronic a fib on eliquis, Dementia, Chronic combined CHF, Hypothyroidism, Seizures, CKD 3a, CAD s/p NSTEMI, HLD, GERD  Significant Hospital Events:   7/3  chronic HCRF from OHS and restrictive lung dz as well as deconditioning-->progression -->decompensated HFpEF and cor pulmonale-->worsening metabolic enceph from HCRF-->PNA --> acute on chronic resp failure. Admitted. Intubated. Ceftriaxone 7/3 x 1. Azith and Cefepime started.  7/4-7/5 Hypotension overnight. A line placed. LR bolus. Started on NE. RUE PICC placed 7/6 Hgb 6.7 (7.4), CT A/P completed to r/o RP bleed (negative), showed mild peripancreatic stranding. Lipase slightly elevated. 1U PRBCs ordered. 7/7 Tracheostomy placed, heparin gtt held for procedure. 7/8 Increased agitation with Propofol wean, increased HR, uptrending pressor requirements. Cortrak placed. Azith and cefepime stopped.  7/11 PEG tube placed, tolerating PSV 7/12 ATC trials started. Tubefeeds started/resumed 7/13 solucortef decreased to daily. Midodrine stopped. Heparin gtt stopped. Stated back on DOAC. PANDA removed 7/14: sat at side of bed w/ max assist for 15 minutes. Fatigued quickly. PT recommending LTAC setting 7/15: Tolerated trach collar yesterday 7/16: TCT x 2 hours 7/18 hgb 6.8 received 1 unit PRBC   Interim History / Subjective:  No acute events overnight  States she  feels well Daughter at bedside and updated   Objective:  Blood pressure 128/70, pulse 78, temperature 98.8 F (37.1 C), temperature source Oral, resp. rate 20, height 5\' 8"  (1.727 m), weight 105.5 kg, SpO2 96 %.    Vent Mode: PRVC FiO2 (%):  [28 %-30 %] 30 % Set Rate:  [20 bmp] 20 bmp Vt Set:  [510 mL] 510 mL PEEP:  [5 cmH20] 5 cmH20 Pressure Support:  [8 cmH20] 8 cmH20 Plateau Pressure:  [22 cmH20-26 cmH20] 23 cmH20   Intake/Output Summary (Last 24 hours) at 01/18/2021 01/20/2021 Last data filed at 01/18/2021 0600 Gross per 24 hour  Intake 1165 ml  Output 650 ml  Net 515 ml     Physical Exam: General: Chronically ill appearing deconditioned elderly female lying in bed on mechanical ventilation throught, in NAD HEENT: 6cuffed XLT trach midline, [place]/AT, MM pink/moist, PERRL Neuro: Alert and interactive, able to follow simple commands  CV: s1s2 regular rate and rhythm, no murmur, rubs, or gallops,  PULM:  Clear to ascultation bilaterally, tolerating vent through trach    GI: soft, bowel sounds active in all 4 quadrants, non-tender, non-distended, tolerating TF Extremities: warm/dry, no edema  Skin: no rashes or lesions  Resolved Hospital Problem List   Septic shock from pneumonia Left upper extremity redness and swelling resolved Relative adrenal insufficiency -Weaned off steroids 7/15  Assessment & Plan:   Acute on chronic hypoxic/hypercapnic respiratory failure from acute pulmonary edema, pneumonia. Restrictive lung disease from kyphoscolosis Wleep disordered breathing (mix of OHS and OSA) Failure to wean from vent s/p tracheostomy on 01/07/21. -Endurance and fatigue are largest barriers for progress here. I do think that she will get to a baseline of ATC day  w/ nocturnal night support. Family is very determined to get her home.  P: Continue ATC trials during the day with vent rest at night  Routine trach care Continue ventilator support with lung protective strategies  Head  of bed elevated 30 degrees. Follow intermittent chest x-ray and ABG.   SAT/SBT as tolerated, mentation preclude extubation  Ensure adequate pulmonary hygiene  Follow cultures  VAP bundle in place   Acute metabolic encephalopathy 2nd to sepsis and hypercapnia - improved  Hx of CVA, seizures, dementia, depression. P: Maintain neuro protective measures Nutrition and bowel regiment  Seizure precautions  Continue Keppra and Neurontin  Aspirations precautions   Severe Physical deconditioning P: Continue PT/OT efforts  Recommendation currently for LTCH but family is insistent on discharge home   Chronic permanent atrial fibrillation Acute on chronic combined CHF -ECHO 45-50% with global hypokinesis  HLD -Cardiology evaluated during admission, now signed off P: Continue Amiodarone, Statin, and DOAC Watch for signs of bleeding  Continuous telemetry   CKD 3a. -Scr stable  P: Follow renal function  Monitor urine output Trend Bmet Avoid nephrotoxins Ensure adequate renal perfusion  Intermittent Fluid and electrolyte imbalance -got K replacement 7/14 P: Trend Bmet  DM type 2 poorly controlled with hyperglycemia -Glycemic control has been challenging. Adjusted bolus time short acting 7/14. Looks like this has helped.  P: Better controlled on current regiment  Continue Lantus and SSI and bolus time coverage   Anemia of critical illness and chronic disease. -No evidence of bleeding P: Hgb further downtrended to 6.8 Currently transfusing 1 unit PRBC now  Hgb goal > 7 Transfuse per protocol    Hypothyroidism P: Continue synthroid    Dysphagia post PEG placement P: Remains on TF SLP following    Best Practice    Diet/type: tubefeeds DVT prophylaxis: DOAC GI prophylaxis: PPI Lines: Central line and yes and it is still needed Has PICC RUE  Foley:  N/A Code Status:  full code Last date of multidisciplinary goals of care discussion: Patient and family updated  daily  Disposition: ICU;  Critical care:  CRITICAL CARE Performed by: Elizabet Schweppe D. Harris  Total critical care time: 37 minutes  Critical care time was exclusive of separately billable procedures and treating other patients.  Critical care was necessary to treat or prevent imminent or life-threatening deterioration.  Critical care was time spent personally by me on the following activities: development of treatment plan with patient and/or surrogate as well as nursing, discussions with consultants, evaluation of patient's response to treatment, examination of patient, obtaining history from patient or surrogate, ordering and performing treatments and interventions, ordering and review of laboratory studies, ordering and review of radiographic studies, pulse oximetry and re-evaluation of patient's condition. Angy Swearengin D. Tiburcio Pea, NP-C [place] [facility] Personal contact information can be found on Amion  01/18/2021, 8:33 AM

## 2021-01-18 NOTE — Progress Notes (Signed)
Spoke w/ IV team regarding pts PICC. Per IV team, CXR should be ordered to ensure catheter tip is in correct position. Will place order.

## 2021-01-18 NOTE — Progress Notes (Signed)
PICC tip noted to be in brachiocephalic confluence per CXR. Primary RN notified that PICC needs to be exchanged to place in central position. Primary RN to notify MD for orders. VAS Team to follow for order.

## 2021-01-18 NOTE — Progress Notes (Signed)
Physical Therapy Treatment Patient Details Name: [name] MRN: [id number] DOB: 06-29-1951 Today's Date: 01/18/2021    History of Present Illness [name] is a 70 y.o. female with medical history significant for obesity, type 2 diabetes, chronic atrial fibrillation on Eliquis, dementia, diastolic CHF with chronic hypoxemia on 2 L nasal cannula, hypothyroidism, seizure disorder, CKD stage III, CAD with prior NSTEMI, dyslipidemia, and GERD who presented to the ED with worsening shortness of breath over the past couple days.  Daughter at bedside states that she has had approximately 20 pound weight gain noted as well over the same timeframe.  Her symptoms were worsening and EMS was called and upon arrival she was noted to have pulse oximetry of 55%.  She was noted to be taking her medications normally according to the daughter and was wearing her home oxygen.  She denied any chest pain, cough, fevers, or chills.  Some mild lower extremity edema has been noted as well.  No other sick contacts and she has had her COVID vaccines and booster shots.  She appears to have been admitted just last month with acute on chronic diastolic CHF.  Her discharge weight at that time was 214 pounds.  She is currently at 227 pounds.    PT Comments    Pt responded accurately to one-step commands during today's session, although fatigued. Pt demonstrated bed mobility task to EOB with mod to max A +2 physical assistance (mod when achieving sidelying by use of bedrails; max for LE and UE/trunk). Pt intermittently maintained upright seated position, requiring mod to max A for sitting balance, max A when pt became more fatigued. Pt also performed LE and UE exercises which were limited by fatigue. Recommend continuation of PT to increase activity tolerance and improve bed mobility tasks.  Follow Up Recommendations  LTACH;Supervision/Assistance - 24 hour     Equipment Recommendations  None recommended by PT     Recommendations for Other Services       Precautions / Restrictions Precautions Precautions: Fall Precaution Comments: vent with trach Restrictions Weight Bearing Restrictions: No    Mobility  Bed Mobility Overal bed mobility: Needs Assistance Bed Mobility: Supine to Sit;Sit to Sidelying     Supine to sit: +2 for physical assistance;Mod assist;Max assist;HOB elevated     General bed mobility comments: Pt in bed with HOB elevated prior to start of PT. Pt required mod A to achieve sidelying prior to sitting EOB, but required max A for LE and UEs to achieve upright seated position at EOB. When seated EOB, pt demonstrated a posterior lean. Verbal and tactile cues provided to maintain upright position.    Transfers                    Ambulation/Gait                 Stairs             Wheelchair Mobility    Modified Rankin (Stroke Patients Only)       Balance Overall balance assessment: Needs assistance Sitting-balance support: Single extremity supported;Feet unsupported Sitting balance-Leahy Scale: Zero Sitting balance - Comments: Pt was able to maintain a seated position EOB with mod to max support, max A when becoming fatigued. Postural control: Posterior lean                                  Cognition Arousal/Alertness:  Awake/alert Behavior During Therapy: WFL for tasks assessed/performed Overall Cognitive Status: Within Functional Limits for tasks assessed                                        Exercises General Exercises - Upper Extremity Shoulder Flexion: AROM;Right;Other (comment) (1 rep R; fatigued on L and did not perform) General Exercises - Lower Extremity Long Arc Quad: AROM;Seated;Right;5 reps;Left;Other (comment) (5 reps on R 1 on L. Verbal and tactile cues provided)    General Comments General comments (skin integrity, edema, etc.): PS/CPAP at 30% PEEP 5      Pertinent Vitals/Pain  Vitals  stable throughout    Home Living                      Prior Function            PT Goals (current goals can now be found in the care plan section)      Frequency    Min 3X/week      PT Plan Current plan remains appropriate    Co-evaluation              AM-PAC PT "6 Clicks" Mobility   Outcome Measure  Help needed turning from your back to your side while in a flat bed without using bedrails?: A Lot Help needed moving from lying on your back to sitting on the side of a flat bed without using bedrails?: Total Help needed moving to and from a bed to a chair (including a wheelchair)?: Total Help needed standing up from a chair using your arms (e.g., wheelchair or bedside chair)?: Total Help needed to walk in hospital room?: Total Help needed climbing 3-5 steps with a railing? : Total 6 Click Score: 7    End of Session   Activity Tolerance: Patient limited by fatigue Patient left: in bed;with bed alarm set;with call bell/phone within reach;with family/visitor present Nurse Communication: Mobility status;Need for lift equipment PT Visit Diagnosis: Unsteadiness on feet (R26.81);Muscle weakness (generalized) (M62.81);Difficulty in walking, not elsewhere classified (R26.2)     Time: 1630-1700 PT Time Calculation (min) (ACUTE ONLY): 30 min  Charges:  $Therapeutic Activity: 23-37 mins                     Velda Shell, SPT Acute Rehab: [phone]    Vance Gather 01/18/2021, 5:52 PM

## 2021-01-19 DIAGNOSIS — J9601 Acute respiratory failure with hypoxia: Secondary | ICD-10-CM | POA: Diagnosis not present

## 2021-01-19 DIAGNOSIS — J9602 Acute respiratory failure with hypercapnia: Secondary | ICD-10-CM | POA: Diagnosis not present

## 2021-01-19 LAB — BPAM RBC
Blood Product Expiration Date: 202207242359
ISSUE DATE / TIME: 202207180620
Unit Type and Rh: 6200

## 2021-01-19 LAB — CBC
HCT: 26.4 % — ABNORMAL LOW (ref 36.0–46.0)
Hemoglobin: 8 g/dL — ABNORMAL LOW (ref 12.0–15.0)
MCH: 28.1 pg (ref 26.0–34.0)
MCHC: 30.3 g/dL (ref 30.0–36.0)
MCV: 92.6 fL (ref 80.0–100.0)
Platelets: 237 10*3/uL (ref 150–400)
RBC: 2.85 MIL/uL — ABNORMAL LOW (ref 3.87–5.11)
RDW: 19.7 % — ABNORMAL HIGH (ref 11.5–15.5)
WBC: 10.2 10*3/uL (ref 4.0–10.5)
nRBC: 0.5 % — ABNORMAL HIGH (ref 0.0–0.2)

## 2021-01-19 LAB — GLUCOSE, CAPILLARY
Glucose-Capillary: 118 mg/dL — ABNORMAL HIGH (ref 70–99)
Glucose-Capillary: 121 mg/dL — ABNORMAL HIGH (ref 70–99)
Glucose-Capillary: 140 mg/dL — ABNORMAL HIGH (ref 70–99)
Glucose-Capillary: 143 mg/dL — ABNORMAL HIGH (ref 70–99)
Glucose-Capillary: 160 mg/dL — ABNORMAL HIGH (ref 70–99)
Glucose-Capillary: 165 mg/dL — ABNORMAL HIGH (ref 70–99)

## 2021-01-19 LAB — TYPE AND SCREEN
ABO/RH(D): A POS
Antibody Screen: NEGATIVE
Unit division: 0

## 2021-01-19 MED ORDER — LOPERAMIDE HCL 1 MG/7.5ML PO SUSP
2.0000 mg | ORAL | Status: DC | PRN
  Administered 2021-01-19 – 2021-01-21 (×5): 2 mg
  Filled 2021-01-19 (×7): qty 15

## 2021-01-19 MED ORDER — METOPROLOL TARTRATE 12.5 MG HALF TABLET
12.5000 mg | ORAL_TABLET | Freq: Two times a day (BID) | ORAL | Status: DC
  Administered 2021-01-19 – 2021-01-20 (×4): 12.5 mg via NASOGASTRIC
  Filled 2021-01-19 (×4): qty 1

## 2021-01-19 NOTE — Progress Notes (Signed)
  Speech Language Pathology Treatment:    Patient Details Name: [name] MRN: [id number] DOB: 1951/04/26 Today's Date: 01/19/2021 Time: 4315-4008 SLP Time Calculation (min) (ACUTE ONLY): 25 min  Assessment / Plan / Recommendation Clinical Impression  Pt seen for in line PMSV intervention. Per discussion with pulmonary, pt currently an excellent candidate for in line PMSV usage with SLP as tolerated. RT present during session, manipulating vent settings; adjusting pt from [facility] to pressure support. Cuff slowly deflated, tidal volume dropped as expected with audible feedback in upper respiratory system as anticipated. PEEP turned to 0 by RT per PMSV recommendations. PMSV placed. Pt with stable O2 (94-97 during usage), RR: (15-20), and HR mainly 105 (transient tachycardic noted x1, to upper 120s, quickly returning to baseline). Per RN pt has had some instances of elevated HR. Pt able to communicate with daughter with PMSV. Intelligibility was variable at the word and short phrase level due to pts reduced breath support for speech. With cueing for pt to remain fully upright, deep breath, and phonation upon exhalation pt with improvements in intelligibility of speech. PMSV utilized for 15 minutes. PMSV removed, cuff reinflated. RT adjusted vent settings back to baseline. SLP to continue to follow.   HPI HPI: 70 yo female presented to [facility] on 7/03 with dyspnea and altered mental status.  Transferred to [facility]; failed BiPAP and required intubation. 7/7 trach; 7/11 PEG. Dx acute on chronic hypoxic/hypercapnic respiratory failure from acute pulmonary edema, pneumonia; severe deconditioning; metabolic encephalopathy.  Had admission from 12/21/20 to 12/30/20 for respiratory failure and CHF exacerbation. PMHx: Kyphoscoliosis, Restrictive lung disease, Chronic respiratory failure on 2 liters, DM type 2, Chronic a fib on eliquis, Dementia, Chronic combined CHF, Hypothyroidism, Seizures, CKD 3a, CAD s/p NSTEMI, HLD,  GERD.      SLP Plan  Continue with current plan of care       Recommendations  Diet recommendations: NPO Medication Administration: Via alternative means      Patient may use Passy-Muir Speech Valve:  (intermittently with supervision when off ventilator)         Oral Care Recommendations: Oral care QID Follow up Recommendations: LTACH SLP Visit Diagnosis: Aphonia (R49.1) Plan: Continue with current plan of care       GO                Ardyth Gal MA, CCC-SLP Acute Rehabilitation Services   01/19/2021, 11:34 AM

## 2021-01-19 NOTE — Progress Notes (Addendum)
NAME:  [name], MRN:  [id number], DOB:  August 04, 1950, LOS: 16 ADMISSION DATE:  01/03/2021, CONSULTATION DATE:  01/03/2021 REFERRING MD:  EDP - [facility] CHIEF COMPLAINT:  SOB, encephalopathy   History of Present Illness:  70 yo female presented to [facility] on 7/03 with dyspnea, and altered mental status.  Transferred to [facility]. SpO2 65% and elevated PCO2.  Failed Bipap and required intubation.  Had admission from 12/21/20 to 12/30/20 for respiratory failure and CHF exacerbation.  Pertinent  Medical History  Kyphoscoliosis, Restrictive lung disease, Chronic respiratory failure on 2 liters, DM type 2, Chronic a fib on eliquis, Dementia, Chronic combined CHF, Hypothyroidism, Seizures, CKD 3a, CAD s/p NSTEMI, HLD, GERD  Significant Hospital Events:   7/3  chronic HCRF from OHS and restrictive lung dz as well as deconditioning-->progression -->decompensated HFpEF and cor pulmonale-->worsening metabolic enceph from HCRF-->PNA --> acute on chronic resp failure. Admitted. Intubated. Ceftriaxone 7/3 x 1. Azith and Cefepime started.  7/4-7/5 Hypotension overnight. A line placed. LR bolus. Started on NE. RUE PICC placed 7/6 Hgb 6.7 (7.4), CT A/P completed to r/o RP bleed (negative), showed mild peripancreatic stranding. Lipase slightly elevated. 1U PRBCs ordered. 7/7 Tracheostomy placed, heparin gtt held for procedure. 7/8 Increased agitation with Propofol wean, increased HR, uptrending pressor requirements. Cortrak placed. Azith and cefepime stopped.  7/11 PEG tube placed, tolerating PSV 7/12 ATC trials started. Tubefeeds started/resumed 7/13 solucortef decreased to daily. Midodrine stopped. Heparin gtt stopped. Stated back on DOAC. PANDA removed 7/14: sat at side of bed w/ max assist for 15 minutes. Fatigued quickly. PT recommending LTAC setting 7/15: Tolerated trach collar yesterday 7/16: TCT x 2 hours 7/18 hgb 6.8 received 1 unit PRBC   Interim History / Subjective:  PICC line replaced overnight due to  dislodgment   Objective:  Blood pressure 131/85, pulse 98, temperature 98.7 F (37.1 C), temperature source Oral, resp. rate (!) 30, height 5\' 8"  (1.727 m), weight 108 kg, SpO2 97 %.    Vent Mode: PSV;CPAP FiO2 (%):  [30 %] 30 % Set Rate:  [20 bmp] 20 bmp Vt Set:  [510 mL] 510 mL PEEP:  [5 cmH20] 5 cmH20 Pressure Support:  [10 cmH20] 10 cmH20 Plateau Pressure:  [17 cmH20-23 cmH20] 23 cmH20   Intake/Output Summary (Last 24 hours) at 01/19/2021 01/21/2021 Last data filed at 01/19/2021 0600 Gross per 24 hour  Intake 1736 ml  Output 950 ml  Net 786 ml    Physical Exam: General: Acute on chronically ill appearing deconditioned elderly female on mechanical ventilation through trach, lying in bed in NAD HEENT: 6 cuffed XLT trach midline, MM pink/moist, PERRL,  Neuro: Alert and able to follow simple, non-focal  CV: s1s2 regular rate and rhythm, no murmur, rubs, or gallops,  PULM:  Clear to ascultation bilaterally, no increased work of breathing, no added breath sounds  GI: soft, bowel sounds active in all 4 quadrants, non-tender, non-distended, tolerating TF Extremities: warm/dry, 1+ pitting lower extremity edema  Skin: no rashes or lesions  Resolved Hospital Problem List   Septic shock from pneumonia Left upper extremity redness and swelling resolved Relative adrenal insufficiency -Weaned off steroids 7/15  Assessment & Plan:   Acute on chronic hypoxic/hypercapnic respiratory failure from acute pulmonary edema, pneumonia. Restrictive lung disease from kyphoscolosis Wleep disordered breathing (mix of OHS and OSA) Failure to wean from vent s/p tracheostomy on 01/07/21. -Endurance and fatigue are largest barriers for progress here. I do think that she will get to a baseline of ATC  day w/ nocturnal night support. Family is very determined to get her home.  P: Continue ATC trails during the day with vent rest at night  Routine trach care  Continue ventilator support with lung protective  strategies  Wean PEEP and FiO2 for sats greater than 90%. Head of bed elevated 30 degrees. Plateau pressures less than 30 cm H20.  Ensure adequate pulmonary hygiene  VAP bundle in place  PAD protocol  Acute metabolic encephalopathy 2nd to sepsis and hypercapnia - improved  Hx of CVA, seizures, dementia, depression. P: Maintain neuro protective measures Nutrition and bowel regiment  Seizure precautions  Continue Keppra and Neurontin  Aspirations precautions  Minimize sedation   Severe Physical deconditioning P: Continue aggressive PT/OT efforts  Recommendation for LTAC  Chronic permanent atrial fibrillation Acute on chronic combined CHF -ECHO 45-50% with global hypokinesis  HLD -Cardiology evaluated during admission, now signed off P: Continue Amiodarone, Statin, and DOAC Watch for signs of bleeding  Continuous telemetry   CKD 3a. -Scr stable  P: Follow renal function  Monitor urine output Trend Bmet Avoid nephrotoxins Ensure adequate renal perfusion   Intermittent Fluid and electrolyte imbalance -got K replacement 7/14 P: Trend Bmet  DM type 2 poorly controlled with hyperglycemia -Glycemic control has been challenging. Adjusted bolus time short acting 7/14. Looks like this has helped.  P: Well controlled on current regiment continue; Lantus, SSI, and Bolus time coverage    Anemia of critical illness and chronic disease. -No evidence of bleeding but hgb downtreded to 6.8 7/18 for which patient received 1 unit PRBC  P: Hgb stable at 8.0 Continues to have no signs of active bleed  Hgb > 7 Transfuse per protocol   Hypothyroidism P: Continue Synthroid per tube   Dysphagia post PEG placement P: Remains on TF  SLP following   Best Practice    Diet/type: tubefeeds DVT prophylaxis: DOAC GI prophylaxis: PPI Lines: Central line and yes and it is still needed Has PICC RUE  Foley:  N/A Code Status:  full code Last date of multidisciplinary goals of  care discussion: Patient and family updated daily  Disposition: ICU;  Critical care:  CRITICAL CARE Performed by: Damaris Abeln D. Harris  Total critical care time: 35 minutes  Critical care time was exclusive of separately billable procedures and treating other patients.  Critical care was necessary to treat or prevent imminent or life-threatening deterioration.  Critical care was time spent personally by me on the following activities: development of treatment plan with patient and/or surrogate as well as nursing, discussions with consultants, evaluation of patient's response to treatment, examination of patient, obtaining history from patient or surrogate, ordering and performing treatments and interventions, ordering and review of laboratory studies, ordering and review of radiographic studies, pulse oximetry and re-evaluation of patient's condition. Suvan Stcyr D. Tiburcio Pea, NP-C [facility] Personal contact information can be found on Amion  01/19/2021, 7:22 AM

## 2021-01-19 NOTE — TOC Progression Note (Addendum)
Transition of Care [facility]) - Progression Note    Patient Details  Name: [name] MRN: [id number] Date of Birth: 28-Jul-1950  Transition of Care [facility]) CM/SW Contact  Epifanio Lesches, RN Phone Number: 01/19/2021, 9:56 AM  Clinical Narrative:    Appeal letter regarding LTAC placement faxed to [facility] / [facility]ens @ [phone].  TOC team will continue to monitor and assist with needs....   Expected Discharge Plan: Long Term Acute Care (LTAC) Barriers to Discharge: Continued Medical Work up  Expected Discharge Plan and Services Expected Discharge Plan: Long Term Acute Care (LTAC)                                               Social Determinants of Health (SDOH) Interventions    Readmission Risk Interventions Readmission Risk Prevention Plan 12/28/2020 11/24/2020 09/21/2020  Transportation Screening Complete Complete Complete  HRI or Home Care Consult - Complete -  Social Work Consult for Recovery Care Planning/Counseling - Complete -  Palliative Care Screening - Not Applicable -  Medication Review Oceanographer) Complete Complete Complete  PCP or Specialist appointment within 3-5 days of discharge - - Complete  HRI or Home Care Consult Complete - Complete  SW Recovery Care/Counseling Consult Complete - Complete  Palliative Care Screening (No Data) - Complete  Skilled Nursing Facility Patient Refused - Complete  Some recent data might be hidden

## 2021-01-20 DIAGNOSIS — J9622 Acute and chronic respiratory failure with hypercapnia: Secondary | ICD-10-CM | POA: Diagnosis not present

## 2021-01-20 DIAGNOSIS — J9621 Acute and chronic respiratory failure with hypoxia: Secondary | ICD-10-CM | POA: Diagnosis not present

## 2021-01-20 LAB — GLUCOSE, CAPILLARY
Glucose-Capillary: 190 mg/dL — ABNORMAL HIGH (ref 70–99)
Glucose-Capillary: 119 mg/dL — ABNORMAL HIGH (ref 70–99)
Glucose-Capillary: 192 mg/dL — ABNORMAL HIGH (ref 70–99)
Glucose-Capillary: 181 mg/dL — ABNORMAL HIGH (ref 70–99)
Glucose-Capillary: 223 mg/dL — ABNORMAL HIGH (ref 70–99)
Glucose-Capillary: 145 mg/dL — ABNORMAL HIGH (ref 70–99)

## 2021-01-20 LAB — COMPREHENSIVE METABOLIC PANEL
ALT: 12 U/L (ref 0–44)
AST: 17 U/L (ref 15–41)
Albumin: 2.1 g/dL — ABNORMAL LOW (ref 3.5–5.0)
Alkaline Phosphatase: 57 U/L (ref 38–126)
Anion gap: 10 (ref 5–15)
BUN: 32 mg/dL — ABNORMAL HIGH (ref 8–23)
CO2: 27 mmol/L (ref 22–32)
Calcium: 8.7 mg/dL — ABNORMAL LOW (ref 8.9–10.3)
Chloride: 109 mmol/L (ref 98–111)
Creatinine, Ser: 0.8 mg/dL (ref 0.44–1.00)
GFR, Estimated: >60 mL/min (ref >60)
Glucose, Bld: 206 mg/dL — ABNORMAL HIGH (ref 70–99)
Potassium: 4.5 mmol/L (ref 3.5–5.1)
Sodium: 146 mmol/L — ABNORMAL HIGH (ref 135–145)
Total Bilirubin: 0.6 mg/dL (ref 0.3–1.2)
Total Protein: 6 g/dL — ABNORMAL LOW (ref 6.5–8.1)

## 2021-01-20 MED ORDER — FREE WATER
200.0000 mL | Freq: Four times a day (QID) | Status: DC
  Administered 2021-01-20 – 2021-02-08 (×66): 200 mL

## 2021-01-20 NOTE — Progress Notes (Signed)
Physical Therapy Treatment Patient Details Name: [name] MRN: [id number] DOB: 03/23/1951 Today's Date: 01/20/2021    History of Present Illness CHEMERE STEFFLER is a 70 y.o. female with medical history significant for obesity, type 2 diabetes, chronic atrial fibrillation on Eliquis, dementia, diastolic CHF with chronic hypoxemia on 2 L nasal cannula, hypothyroidism, seizure disorder, CKD stage III, CAD with prior NSTEMI, dyslipidemia, and GERD who presented to the ED with worsening shortness of breath over the past couple days.  Daughter at bedside states that she has had approximately 20 pound weight gain noted as well over the same timeframe.  Her symptoms were worsening and EMS was called and upon arrival she was noted to have pulse oximetry of 55%.  She was noted to be taking her medications normally according to the daughter and was wearing her home oxygen.  She denied any chest pain, cough, fevers, or chills.  Some mild lower extremity edema has been noted as well.  No other sick contacts and she has had her COVID vaccines and booster shots.  She appears to have been admitted just last month with acute on chronic diastolic CHF.  Her discharge weight at that time was 214 pounds.  She is currently at 227 pounds.    PT Comments    Pt very lethargic during session, having difficulty staying aroused/awake. Pt unable to perform sit to stand transfer with max A +2, limited by lethargy and LE weakness. Pt demonstrated decreased bed mobility compared to previous sessions, as well as decreased transfers and function. Recommend pt continues with PT to progress bed mobility and transfers.  Follow Up Recommendations  LTACH;Supervision/Assistance - 24 hour     Equipment Recommendations  None recommended by PT    Recommendations for Other Services       Precautions / Restrictions Precautions Precautions: Fall Precaution Comments: vent with trach Restrictions Weight Bearing Restrictions: No     Mobility  Bed Mobility Overal bed mobility: Needs Assistance Bed Mobility: Supine to Sit     Supine to sit: HOB elevated;Max assist;+2 for physical assistance;+2 for safety/equipment     General bed mobility comments: Pt in bed with HOB elevated prior to start of session on vent, PRVC FiO2 30%, PEEP 5. Pt required max A +2 to achieve supine to sit due to lethargy. Pt demonstrated posterior lean at EOB. Verbal cues provided during bed mobility.    Transfers Overall transfer level: Needs assistance   Transfers: Sit to/from Stand Sit to Stand: +2 physical assistance;Total assist         General transfer comment: Attempted sit to stand with stedy. Pt unable to perform transfer with total A with pads, gait belt, and stedy, as pt not able to push from LEs and clear buttock. Used maxisky from EOB to recliner.  Ambulation/Gait                 Stairs             Wheelchair Mobility    Modified Rankin (Stroke Patients Only)       Balance                                            Cognition Arousal/Alertness: Lethargic Behavior During Therapy: WFL for tasks assessed/performed Overall Cognitive Status: Difficult to assess  General Comments: Pt very lethargic today with decreased level of arousal. Talked to nurse about pt's lethargy, and nurse mentioned a slight possibility recent meds could be attributed to pt's lethargy      Exercises General Exercises - Lower Extremity Long Arc Quad: AROM;Right;5 reps;Seated;Left;Other (comment) (2 reps on L, limited due to lethargy)    General Comments        Pertinent Vitals/Pain  VSS    Home Living                      Prior Function            PT Goals (current goals can now be found in the care plan section) Acute Rehab PT Goals Patient Stated Goal: To get stronger PT Goal Formulation: With patient/family Time For Goal Achievement:  01/28/21 Potential to Achieve Goals: Fair Progress towards PT goals: Progressing toward goals    Frequency    Min 3X/week      PT Plan Current plan remains appropriate    Co-evaluation              AM-PAC PT "6 Clicks" Mobility   Outcome Measure  Help needed turning from your back to your side while in a flat bed without using bedrails?: A Lot Help needed moving from lying on your back to sitting on the side of a flat bed without using bedrails?: Total Help needed moving to and from a bed to a chair (including a wheelchair)?: Total Help needed standing up from a chair using your arms (e.g., wheelchair or bedside chair)?: Total Help needed to walk in hospital room?: Total Help needed climbing 3-5 steps with a railing? : Total 6 Click Score: 7    End of Session Equipment Utilized During Treatment: Gait belt;Other (comment) (stedy, maxisky) Activity Tolerance: Patient limited by lethargy Patient left: in chair;with call bell/phone within reach;with family/visitor present;with chair alarm set Nurse Communication: Mobility status;Need for lift equipment PT Visit Diagnosis: Unsteadiness on feet (R26.81);Muscle weakness (generalized) (M62.81);Difficulty in walking, not elsewhere classified (R26.2)     Time: 1001-1035 PT Time Calculation (min) (ACUTE ONLY): 34 min  Charges:  $Therapeutic Activity: 23-37 mins                     Velda Shell, SPT Acute Rehab: [phone]    Vance Gather 01/20/2021, 1:07 PM

## 2021-01-20 NOTE — Progress Notes (Signed)
Nutrition Follow-up  DOCUMENTATION CODES:   Obesity unspecified  INTERVENTION:   Continue bolus TF via PEG: Dillard [place] Standard 1.4, 325 ml 4 times per day. Recommend bolus slowly over 15-20 minutes to promote tolerance. Prosource TF 45 ml TID  Provides 1940 kcal, 113 gm protein daily, 936 ml free water daily.  Continue free water flushes 200 ml every 6 hours for a total of 1736 ml free water daily.  NUTRITION DIAGNOSIS:   Inadequate oral intake related to inability to eat as evidenced by NPO status.  Ongoing   GOAL:   Provide needs based on ASPEN/SCCM guidelines  Met with TF  MONITOR:   Weight trends, TF tolerance, Labs, Vent status, Skin, I & O's  REASON FOR ASSESSMENT:   Consult Assessment of nutrition requirement/status, Enteral/tube feeding initiation and management  ASSESSMENT:   Pt with a PMH including T2DM, Afib, dementia, CHF, seizure disorder, CKD stage 3, CAD w/ prior NSTEMI, HTN, hypothyrodism, and dyslipidemia admitted with acute combined hypoxic and hypercapnic respiratory failure 2/2 CHF, HTN, restrictive lung disease 2/2 kyphosis. Note pt recently admitted from 6/20-6/29 for acute on chronic hypoxic hypercapnic respiratory failure 2/2 CHF exacerbations.  Discussed patient in ICU rounds and with RN today. Tolerated a small amount of time on trach collar yesterday.   Bolus feedings via PEG initiated 7/12. Patient is tolerating bolus TF without difficulty. RN is giving Zofran with bolus feedings, but patient also took this PTA.  Free water flushes increased to 200 ml every 6 hours today.  Patient remains intubated on ventilator support via trach MV: 9 L/min Temp (24hrs), Avg:98.9 F (37.2 C), Min:98 F (36.7 C), Max:99.7 F (37.6 C)   Labs reviewed. Na 146, BUN 32 CBG: 190-119-192  Medications reviewed and include lasix, novolog, lantus, protonix.  Admission weight 106.1 kg Current weight 102.8 kg (lowest weight since admission) I/O +4.15 L  since admission   Diet Order:   Diet Order             Diet NPO time specified Except for: Sips with Meds  Diet effective midnight                   EDUCATION NEEDS:   Not appropriate for education at this time  Skin:  Skin Assessment: Reviewed RN Assessment (skin tear R arm noted from recent admission)  Last BM:  7/20 type 6  Height:   Ht Readings from Last 1 Encounters:  01/03/21 '5\' 8"'  (1.727 m)    Weight:   Wt Readings from Last 1 Encounters:  01/20/21 102.8 kg    Ideal Body Weight:  63.63 kg  BMI:  Body mass index is 34.46 kg/m.  Estimated Nutritional Needs:   Kcal:  0370-4888  Protein:  110-130 gm  Fluid:  >/= 1.8 L    Lucas Mallow, RD, LDN, CNSC Please refer to Amion for contact information.

## 2021-01-20 NOTE — Progress Notes (Signed)
NAME:  [name], MRN:  [id number], DOB:  1950-10-13, LOS: 17 ADMISSION DATE:  01/03/2021, CONSULTATION DATE:  01/03/2021 REFERRING MD:  EDP - [facility] CHIEF COMPLAINT:  SOB, encephalopathy   History of Present Illness:  70 yo female presented to [facility] on 7/03 with dyspnea, and altered mental status.  Transferred to [facility]. SpO2 65% and elevated PCO2.  Failed Bipap and required intubation.  Had admission from 12/21/20 to 12/30/20 for respiratory failure and CHF exacerbation.  Pertinent  Medical History  Kyphoscoliosis, Restrictive lung disease, Chronic respiratory failure on 2 liters, DM type 2, Chronic a fib on eliquis, Dementia, Chronic combined CHF, Hypothyroidism, Seizures, CKD 3a, CAD s/p NSTEMI, HLD, GERD  Significant Hospital Events:   7/3  chronic HCRF from OHS and restrictive lung dz as well as deconditioning-->progression -->decompensated HFpEF and cor pulmonale-->worsening metabolic enceph from HCRF-->PNA --> acute on chronic resp failure. Admitted. Intubated. Ceftriaxone 7/3 x 1. Azith and Cefepime started.  7/4-7/5 Hypotension overnight. A line placed. LR bolus. Started on NE. RUE PICC placed 7/6 Hgb 6.7 (7.4), CT A/P completed to r/o RP bleed (negative), showed mild peripancreatic stranding. Lipase slightly elevated. 1U PRBCs ordered. 7/7 Tracheostomy placed, heparin gtt held for procedure. 7/8 Increased agitation with Propofol wean, increased HR, uptrending pressor requirements. Cortrak placed. Azith and cefepime stopped.  7/11 PEG tube placed, tolerating PSV 7/12 ATC trials started. Tubefeeds started/resumed 7/13 solucortef decreased to daily. Midodrine stopped. Heparin gtt stopped. Stated back on DOAC. PANDA removed 7/14: sat at side of bed w/ max assist for 15 minutes. Fatigued quickly. PT recommending LTAC setting 7/15: Tolerated trach collar yesterday 7/16: TCT x 2 hours 7/18 hgb 6.8 received 1 unit PRBC   Interim History / Subjective:  No acute events overnight    Objective:  Blood pressure 126/85, pulse 74, temperature 98.9 F (37.2 C), temperature source Oral, resp. rate 20, height 5\' 8"  (1.727 m), weight 102.8 kg, SpO2 99 %.    Vent Mode: PRVC FiO2 (%):  [30 %] 30 % Set Rate:  [20 bmp] 20 bmp Vt Set:  [510 mL] 510 mL PEEP:  [5 cmH20] 5 cmH20 Plateau Pressure:  [18 cmH20-23 cmH20] 23 cmH20   Intake/Output Summary (Last 24 hours) at 01/20/2021 01/22/2021 Last data filed at 01/19/2021 1800 Gross per 24 hour  Intake 1095 ml  Output 600 ml  Net 495 ml    Physical Exam: General: Chronically ill appearing elderly deconditioned elderly female on mechanical ventilation through trach, in NAD HEENT: 6 cuffed Shiley trach midline, MM pink/moist, PERRL,  Neuro: Alert and able to follow simple commands  CV: s1s2 regular rate and rhythm, no murmur, rubs, or gallops,  PULM:  Clear  to ascultation, no increased work of breathing, tolerating vent through trach  GI: soft, bowel sounds active in all 4 quadrants, non-tender, non-distended, tolerating TF Extremities: warm/dry, no edema  Skin: no rashes or lesions  Resolved Hospital Problem List   Septic shock from pneumonia Left upper extremity redness and swelling resolved Relative adrenal insufficiency -Weaned off steroids 7/15 Acute metabolic encephalopathy 2nd to sepsis and hypercapnia - improved   Assessment & Plan:   Acute on chronic hypoxic/hypercapnic respiratory failure from acute pulmonary edema, pneumonia. Restrictive lung disease from kyphoscolosis Wleep disordered breathing (mix of OHS and OSA) Failure to wean from vent s/p tracheostomy on 01/07/21. -Endurance and fatigue are largest barriers for progress here. I do think that she will get to a baseline of ATC day w/ nocturnal night support. Family is  very determined to get her home.  P: Routine trach care  Continue to trial ATC to allow for pulmonary progression   Continue ventilator support with lung protective strategies  Head of bed  elevated 30 degrees.  Follow intermittent chest x-ray and ABG Ensure adequate pulmonary hygiene  VAP bundle in place  PAD protocol  Hx of CVA, seizures, dementia, depression. P: Maintain neuro protective measures Nutrition and bowel regiment  Seizure precautions  Continue Keppra and Neurontin  Aspirations precautions   Severe Physical deconditioning P: Continue aggressive PT/OT LTAC appeal pending   Chronic permanent atrial fibrillation Acute on chronic combined CHF -ECHO 45-50% with global hypokinesis  HLD -Cardiology evaluated during admission, now signed off P: Continue Amio, Statin, and DOAC I&O  Continuous telemetry   CKD 3a. -Scr stable  P: Follow renal function  Monitor urine output Trend Bmet Avoid nephrotoxins Ensure adequate renal perfusion  Intermittent Fluid and electrolyte imbalance -got K replacement 7/14 P: Trend Bmet  DM type 2 poorly controlled with hyperglycemia -Glycemic control has been challenging. Adjusted bolus time short acting 7/14. Looks like this has helped.  P: Continue current regiment, remains well controlled   Anemia of critical illness and chronic disease. -No evidence of bleeding but hgb downtreded to 6.8 7/18 for which patient received 1 unit PRBC  P: Intermittently trend CBC Transfuse per protocol  Hgb  goal > 7  Hypothyroidism P: Continue synthroid per tube   Dysphagia post PEG placement P: Remains on TF  Best Practice    Diet/type: tubefeeds DVT prophylaxis: DOAC GI prophylaxis: PPI Lines: Central line and yes and it is still needed Has PICC RUE  Foley:  N/A Code Status:  full code Last date of multidisciplinary goals of care discussion: Patient and family updated daily  Disposition: ICU;  Critical care:  CRITICAL CARE Performed by: Junior Kenedy D. Harris  Total critical care time: 37 minutes  Critical care time was exclusive of separately billable procedures and treating other patients.  Critical care  was necessary to treat or prevent imminent or life-threatening deterioration.  Critical care was time spent personally by me on the following activities: development of treatment plan with patient and/or surrogate as well as nursing, discussions with consultants, evaluation of patient's response to treatment, examination of patient, obtaining history from patient or surrogate, ordering and performing treatments and interventions, ordering and review of laboratory studies, ordering and review of radiographic studies, pulse oximetry and re-evaluation of patient's condition. Tyra Gural D. Tiburcio Pea, NP-C [facility] Personal contact information can be found on Amion  01/20/2021, 8:38 AM

## 2021-01-21 ENCOUNTER — Ambulatory Visit: Payer: PPO | Admitting: Cardiology

## 2021-01-21 DIAGNOSIS — J9622 Acute and chronic respiratory failure with hypercapnia: Secondary | ICD-10-CM | POA: Diagnosis not present

## 2021-01-21 DIAGNOSIS — J9621 Acute and chronic respiratory failure with hypoxia: Secondary | ICD-10-CM | POA: Diagnosis not present

## 2021-01-21 LAB — BASIC METABOLIC PANEL
Anion gap: 5 (ref 5–15)
BUN: 30 mg/dL — ABNORMAL HIGH (ref 8–23)
CO2: 27 mmol/L (ref 22–32)
Calcium: 8.3 mg/dL — ABNORMAL LOW (ref 8.9–10.3)
Chloride: 112 mmol/L — ABNORMAL HIGH (ref 98–111)
Creatinine, Ser: 0.82 mg/dL (ref 0.44–1.00)
GFR, Estimated: >60 mL/min (ref >60)
Glucose, Bld: 126 mg/dL — ABNORMAL HIGH (ref 70–99)
Potassium: 4.4 mmol/L (ref 3.5–5.1)
Sodium: 144 mmol/L (ref 135–145)

## 2021-01-21 LAB — CBC
HCT: 26.7 % — ABNORMAL LOW (ref 36.0–46.0)
Hemoglobin: 7.7 g/dL — ABNORMAL LOW (ref 12.0–15.0)
MCH: 27.8 pg (ref 26.0–34.0)
MCHC: 28.8 g/dL — ABNORMAL LOW (ref 30.0–36.0)
MCV: 96.4 fL (ref 80.0–100.0)
Platelets: 218 10*3/uL (ref 150–400)
RBC: 2.77 MIL/uL — ABNORMAL LOW (ref 3.87–5.11)
RDW: 19.9 % — ABNORMAL HIGH (ref 11.5–15.5)
WBC: 8.3 10*3/uL (ref 4.0–10.5)
nRBC: 0.4 % — ABNORMAL HIGH (ref 0.0–0.2)

## 2021-01-21 LAB — GLUCOSE, CAPILLARY
Glucose-Capillary: 129 mg/dL — ABNORMAL HIGH (ref 70–99)
Glucose-Capillary: 134 mg/dL — ABNORMAL HIGH (ref 70–99)
Glucose-Capillary: 141 mg/dL — ABNORMAL HIGH (ref 70–99)
Glucose-Capillary: 153 mg/dL — ABNORMAL HIGH (ref 70–99)
Glucose-Capillary: 156 mg/dL — ABNORMAL HIGH (ref 70–99)
Glucose-Capillary: 174 mg/dL — ABNORMAL HIGH (ref 70–99)
Glucose-Capillary: 76 mg/dL (ref 70–99)

## 2021-01-21 MED ORDER — METOPROLOL TARTRATE 12.5 MG HALF TABLET
12.5000 mg | ORAL_TABLET | Freq: Two times a day (BID) | ORAL | Status: DC
  Administered 2021-01-21 – 2021-01-22 (×3): 12.5 mg
  Filled 2021-01-21 (×3): qty 1

## 2021-01-21 NOTE — Progress Notes (Signed)
  Speech Language Pathology Treatment: [name] Speaking valve  Patient Details Name: [name] MRN: [id number] DOB: [birth year] Today's Date: 01/21/2021 Time: 0950-1000 SLP Time Calculation (min) (ACUTE ONLY): 10 min  Assessment / Plan / Recommendation Clinical Impression  Pt seen on trach collar; pt became progressively fatigued during PMSV trial, complained of heat and coughing though secretions were minimal and expectorated orally. She was able to phonate clearly at word level over multiple trials, but had little interest in communication given her discomfort. At end of session SpO2 at 90 and HR at 125. RT returned to place pt back on vent. Will continue efforts.   HPI HPI: 70 yo female presented to [facility] on 7/03 with dyspnea and altered mental status.  Transferred to [facility]; failed BiPAP and required intubation. 7/7 trach; 7/11 PEG. Dx acute on chronic hypoxic/hypercapnic respiratory failure from acute pulmonary edema, pneumonia; severe deconditioning; metabolic encephalopathy.  Had admission from 12/21/20 to 12/30/20 for respiratory failure and CHF exacerbation. PMHx: Kyphoscoliosis, Restrictive lung disease, Chronic respiratory failure on 2 liters, DM type 2, Chronic a fib on eliquis, Dementia, Chronic combined CHF, Hypothyroidism, Seizures, CKD 3a, CAD s/p NSTEMI, HLD, GERD.      SLP Plan          Recommendations         Patient may use Passy-Muir Speech Valve: Intermittently with supervision PMSV Supervision: Full MD: Please consider changing trach tube to : Smaller size;Cuffless         Oral Care Recommendations: Oral care BID Follow up Recommendations: LTACH SLP Visit Diagnosis: Aphonia (R49.1)       GO                [name], Riley Nearing 01/21/2021, 1:49 PM

## 2021-01-21 NOTE — Progress Notes (Signed)
NAME:  [name], MRN:  [id number], DOB:  12/23/1950, LOS: 18 ADMISSION DATE:  01/03/2021, CONSULTATION DATE:  01/03/2021 REFERRING MD:  EDP - [facility] CHIEF COMPLAINT:  SOB, encephalopathy   History of Present Illness:  70 yo female presented to [facility] on 7/03 with dyspnea, and altered mental status.  Transferred to [facility]. SpO2 65% and elevated PCO2.  Failed Bipap and required intubation.  Had admission from 12/21/20 to 12/30/20 for respiratory failure and CHF exacerbation.  Pertinent  Medical History  Kyphoscoliosis, Restrictive lung disease, Chronic respiratory failure on 2 liters, DM type 2, Chronic a fib on eliquis, Dementia, Chronic combined CHF, Hypothyroidism, Seizures, CKD 3a, CAD s/p NSTEMI, HLD, GERD  Significant Hospital Events:   7/3  chronic HCRF from OHS and restrictive lung dz as well as deconditioning-->progression -->decompensated HFpEF and cor pulmonale-->worsening metabolic enceph from HCRF-->PNA --> acute on chronic resp failure. Admitted. Intubated. Ceftriaxone 7/3 x 1. Azith and Cefepime started.  7/4-7/5 Hypotension overnight. A line placed. LR bolus. Started on NE. RUE PICC placed 7/6 Hgb 6.7 (7.4), CT A/P completed to r/o RP bleed (negative), showed mild peripancreatic stranding. Lipase slightly elevated. 1U PRBCs ordered. 7/7 Tracheostomy placed, heparin gtt held for procedure. 7/8 Increased agitation with Propofol wean, increased HR, uptrending pressor requirements. Cortrak placed. Azith and cefepime stopped.  7/11 PEG tube placed, tolerating PSV 7/12 ATC trials started. Tubefeeds started/resumed 7/13 solucortef decreased to daily. Midodrine stopped. Heparin gtt stopped. Stated back on DOAC. PANDA removed 7/14: sat at side of bed w/ max assist for 15 minutes. Fatigued quickly. PT recommending LTAC setting 7/15: Tolerated trach collar yesterday 7/16: TCT x 2 hours 7/18 hgb 6.8 received 1 unit PRBC   Interim History / Subjective:   Tolerating brief periods of trach  collar. BP is stable. No acute events overnight.  Objective:  Blood pressure 132/82, pulse 96, temperature 98.4 F (36.9 C), temperature source Oral, resp. rate (!) 31, height 5\' 8"  (1.727 m), weight 107.2 kg, SpO2 97 %.    Vent Mode: PRVC FiO2 (%):  [30 %] 30 % Set Rate:  [20 bmp] 20 bmp Vt Set:  [510 mL] 510 mL PEEP:  [5 cmH20] 5 cmH20 Pressure Support:  [8 cmH20] 8 cmH20 Plateau Pressure:  [23 cmH20] 23 cmH20   Intake/Output Summary (Last 24 hours) at 01/21/2021 0758 Last data filed at 01/21/2021 0600 Gross per 24 hour  Intake 2300 ml  Output 850 ml  Net 1450 ml   Physical Exam: Gen:      No acute distress, obese, chronically ill-appearing HEENT:  EOMI, sclera anicteric Neck:     No masses; no thyromegaly, trach Lungs:    Clear to auscultation bilaterally; normal respiratory effort CV:         Regular rate and rhythm; no murmurs Abd:      + bowel sounds; soft, non-tender; no palpable masses, no distension Ext:    No edema; adequate peripheral perfusion Skin:      Warm and dry; no rash Neuro: Somnolent arousable  Resolved Hospital Problem List   Septic shock from pneumonia Left upper extremity redness and swelling resolved Relative adrenal insufficiency -Weaned off steroids 7/15 Acute metabolic encephalopathy 2nd to sepsis and hypercapnia - improved  Intermittent Fluid and electrolyte imbalance  Assessment & Plan:   Acute on chronic hypoxic/hypercapnic respiratory failure from acute pulmonary edema, pneumonia. Restrictive lung disease from kyphoscolosis Wleep disordered breathing (mix of OHS and OSA) Failure to wean from vent s/p tracheostomy on 01/07/21. -Endurance  and fatigue are largest barriers for progress here. I do think that she will get to a baseline of ATC day w/ nocturnal night support. Family is very determined to get her home.  P: Routine trach care, Trach collar trials as tolerated, bronchial hygiene Intermittent chest x-ray  Hx of CVA, seizures,  dementia, depression. P: Continue Keppra, Neurontin  Severe Physical deconditioning P: Continue aggressive PT/OT  Chronic permanent atrial fibrillation Acute on chronic combined CHF -ECHO 45-50% with global hypokinesis  HLD -Cardiology evaluated during admission, now signed off P: Continue Amio, Statin, and DOAC I&O  Continuous telemetry    DM type 2 poorly controlled with hyperglycemia -Glycemic control has been challenging. Adjusted bolus time short acting 7/14. Looks like this has helped.  P: Continue current regiment, remains well controlled   Anemia of critical illness and chronic disease. -No evidence of bleeding but hgb downtreded to 6.8 7/18 for which patient received 1 unit PRBC  P: Intermittently trend CBC Transfuse per protocol  Hgb  goal > 7  Hypothyroidism P: Continue synthroid per tube   Dysphagia post PEG placement P: Remains on TF  Best Practice    Diet/type: tubefeeds DVT prophylaxis: DOAC GI prophylaxis: PPI Lines: Central line and yes and it is still needed Has PICC RUE  Foley:  N/A Code Status:  full code Last date of multidisciplinary goals of care discussion: Patient and family updated daily  Disposition: ICU;  Critical care:   The patient is critically ill with multiple organ system failure and requires high complexity decision making for assessment and support, frequent evaluation and titration of therapies, advanced monitoring, review of radiographic studies and interpretation of complex data.   Critical Care Time devoted to patient care services, exclusive of separately billable procedures, described in this note is 45 minutes.   Chilton Greathouse MD [facility] See Amion for pager  If no response to pager , please call [phone] until 7pm After 7:00 pm call Elink  [phone] 01/21/2021, 8:04 AM

## 2021-01-21 NOTE — TOC Progression Note (Addendum)
Transition of Care [facility]) - Progression Note    Patient Details  Name: TRACIE DORE MRN: [id number] Date of Birth: January 24, 1951  Transition of [facility]) CM/SW Contact  Ralene Bathe, LCSWA Phone Number: 01/21/2021, 11:42 AM  Clinical Narrative:    11:40- CSW contacted the patient's daughter, Alver Fisher, to inform of the appeal denial for [facility].  Ms. Leroy Libman wants the patient to return home.  The daughter reports that she will get any equipment needed as "she (patient) is not going anywhere but home".    RNCM notified.   Expected Discharge Plan: Long Term Acute Care (LTAC) Barriers to Discharge: Continued Medical Work up  Expected Discharge Plan and Services Expected Discharge Plan: Long Term Acute Care (LTAC)                                               Social Determinants of Health (SDOH) Interventions    Readmission Risk Interventions Readmission Risk Prevention Plan 12/28/2020 11/24/2020 09/21/2020  Transportation Screening Complete Complete Complete  HRI or Home Care Consult - Complete -  Social Work Consult for Recovery Care Planning/Counseling - Complete -  Palliative Care Screening - Not Applicable -  Medication Review Oceanographer) Complete Complete Complete  PCP or Specialist appointment within 3-5 days of discharge - - Complete  HRI or Home Care Consult Complete - Complete  SW Recovery Care/Counseling Consult Complete - Complete  Palliative Care Screening (No Data) - Complete  Skilled Nursing Facility Patient Refused - Complete  Some recent data might be hidden

## 2021-01-22 ENCOUNTER — Inpatient Hospital Stay (HOSPITAL_COMMUNITY): Payer: PPO

## 2021-01-22 DIAGNOSIS — J9622 Acute and chronic respiratory failure with hypercapnia: Secondary | ICD-10-CM | POA: Diagnosis not present

## 2021-01-22 DIAGNOSIS — J9621 Acute and chronic respiratory failure with hypoxia: Secondary | ICD-10-CM | POA: Diagnosis not present

## 2021-01-22 LAB — CBC
HCT: 27.5 % — ABNORMAL LOW (ref 36.0–46.0)
HCT: 28.2 % — ABNORMAL LOW (ref 36.0–46.0)
Hemoglobin: 8.4 g/dL — ABNORMAL LOW (ref 12.0–15.0)
Hemoglobin: 8.4 g/dL — ABNORMAL LOW (ref 12.0–15.0)
MCH: 28.9 pg (ref 26.0–34.0)
MCH: 28.5 pg (ref 26.0–34.0)
MCHC: 30.5 g/dL (ref 30.0–36.0)
MCHC: 29.8 g/dL — ABNORMAL LOW (ref 30.0–36.0)
MCV: 94.5 fL (ref 80.0–100.0)
MCV: 95.6 fL (ref 80.0–100.0)
Platelets: 244 10*3/uL (ref 150–400)
Platelets: 249 10*3/uL (ref 150–400)
RBC: 2.91 MIL/uL — ABNORMAL LOW (ref 3.87–5.11)
RBC: 2.95 MIL/uL — ABNORMAL LOW (ref 3.87–5.11)
RDW: 19.8 % — ABNORMAL HIGH (ref 11.5–15.5)
RDW: 19.9 % — ABNORMAL HIGH (ref 11.5–15.5)
WBC: 8 10*3/uL (ref 4.0–10.5)
WBC: 11.5 10*3/uL — ABNORMAL HIGH (ref 4.0–10.5)
nRBC: 0 % (ref 0.0–0.2)
nRBC: 0 % (ref 0.0–0.2)

## 2021-01-22 LAB — BASIC METABOLIC PANEL
Anion gap: 7 (ref 5–15)
BUN: 29 mg/dL — ABNORMAL HIGH (ref 8–23)
CO2: 26 mmol/L (ref 22–32)
Calcium: 8.4 mg/dL — ABNORMAL LOW (ref 8.9–10.3)
Chloride: 109 mmol/L (ref 98–111)
Creatinine, Ser: 0.8 mg/dL (ref 0.44–1.00)
GFR, Estimated: >60 mL/min (ref >60)
Glucose, Bld: 178 mg/dL — ABNORMAL HIGH (ref 70–99)
Potassium: 4.1 mmol/L (ref 3.5–5.1)
Sodium: 142 mmol/L (ref 135–145)

## 2021-01-22 LAB — GLUCOSE, CAPILLARY
Glucose-Capillary: 154 mg/dL — ABNORMAL HIGH (ref 70–99)
Glucose-Capillary: 159 mg/dL — ABNORMAL HIGH (ref 70–99)
Glucose-Capillary: 142 mg/dL — ABNORMAL HIGH (ref 70–99)
Glucose-Capillary: 83 mg/dL (ref 70–99)
Glucose-Capillary: 103 mg/dL — ABNORMAL HIGH (ref 70–99)
Glucose-Capillary: 139 mg/dL — ABNORMAL HIGH (ref 70–99)

## 2021-01-22 LAB — PHOSPHORUS: Phosphorus: 4 mg/dL (ref 2.5–4.6)

## 2021-01-22 LAB — MAGNESIUM: Magnesium: 2.2 mg/dL (ref 1.7–2.4)

## 2021-01-22 MED ORDER — METOPROLOL TARTRATE 25 MG PO TABS
25.0000 mg | ORAL_TABLET | Freq: Two times a day (BID) | ORAL | Status: DC
  Administered 2021-01-22 – 2021-02-01 (×16): 25 mg
  Filled 2021-01-22 (×19): qty 1

## 2021-01-22 MED ORDER — LEVETIRACETAM IN NACL 1000 MG/100ML IV SOLN
1000.0000 mg | Freq: Once | INTRAVENOUS | Status: DC
  Filled 2021-01-22: qty 100

## 2021-01-22 MED ORDER — FENTANYL CITRATE (PF) 100 MCG/2ML IJ SOLN: 25.0000 ug | INTRAMUSCULAR | Status: DC | PRN

## 2021-01-22 MED ORDER — GUAIFENESIN 100 MG/5ML PO SOLN
5.0000 mL | Freq: Four times a day (QID) | ORAL | Status: DC | PRN
  Administered 2021-01-22: 100 mg via ORAL
  Filled 2021-01-22: qty 5

## 2021-01-22 MED ORDER — PHENOL 1.4 % MT LIQD
1.0000 | OROMUCOSAL | Status: DC | PRN
  Filled 2021-01-22: qty 177

## 2021-01-22 MED ORDER — PROCHLORPERAZINE EDISYLATE 10 MG/2ML IJ SOLN
10.0000 mg | Freq: Once | INTRAMUSCULAR | Status: AC
  Administered 2021-01-22: 10 mg via INTRAVENOUS
  Filled 2021-01-22: qty 2

## 2021-01-22 MED ORDER — FUROSEMIDE 40 MG PO TABS
40.0000 mg | ORAL_TABLET | Freq: Two times a day (BID) | ORAL | Status: DC
  Administered 2021-01-22 – 2021-01-26 (×9): 40 mg
  Filled 2021-01-22 (×9): qty 1

## 2021-01-22 MED ORDER — PROCHLORPERAZINE EDISYLATE 10 MG/2ML IJ SOLN
10.0000 mg | Freq: Four times a day (QID) | INTRAMUSCULAR | Status: DC | PRN
  Administered 2021-01-22 – 2021-02-13 (×10): 10 mg via INTRAVENOUS
  Filled 2021-01-22 (×11): qty 2

## 2021-01-22 MED ORDER — METOPROLOL TARTRATE 12.5 MG HALF TABLET
12.5000 mg | ORAL_TABLET | Freq: Once | ORAL | Status: AC
  Administered 2021-01-22: 12.5 mg
  Filled 2021-01-22: qty 1

## 2021-01-22 NOTE — Progress Notes (Signed)
NAME:  [name], MRN:  [id number], DOB:  12-21-1950, LOS: 19 ADMISSION DATE:  01/03/2021, CONSULTATION DATE:  01/03/2021 REFERRING MD:  EDP - [facility] CHIEF COMPLAINT:  SOB, encephalopathy   History of Present Illness:  70 yo female presented to [facility] on 7/03 with dyspnea, and altered mental status.  Transferred to [facility] SpO2 65% and elevated PCO2.  Failed Bipap and required intubation.  Had admission from 12/21/20 to 12/30/20 for respiratory failure and CHF exacerbation.  Pertinent  Medical History  Kyphoscoliosis, Restrictive lung disease, Chronic respiratory failure on 2 liters, DM type 2, Chronic a fib on eliquis, Dementia, Chronic combined CHF, Hypothyroidism, Seizures, CKD 3a, CAD s/p NSTEMI, HLD, GERD  Significant Hospital Events:   7/3  chronic HCRF from OHS and restrictive lung dz as well as deconditioning-->progression -->decompensated HFpEF and cor pulmonale-->worsening metabolic enceph from HCRF-->PNA --> acute on chronic resp failure. Admitted. Intubated. Ceftriaxone 7/3 x 1. Azith and Cefepime started.  7/4-7/5 Hypotension overnight. A line placed. LR bolus. Started on NE. RUE PICC placed 7/6 Hgb 6.7 (7.4), CT A/P completed to r/o RP bleed (negative), showed mild peripancreatic stranding. Lipase slightly elevated. 1U PRBCs ordered. 7/7 Tracheostomy placed, heparin gtt held for procedure. 7/8 Increased agitation with Propofol wean, increased HR, uptrending pressor requirements. Cortrak placed. Azith and cefepime stopped.  7/11 PEG tube placed, tolerating PSV 7/12 ATC trials started. Tubefeeds started/resumed 7/13 solucortef decreased to daily. Midodrine stopped. Heparin gtt stopped. Stated back on DOAC. PANDA removed 7/14: sat at side of bed w/ max assist for 15 minutes. Fatigued quickly. PT recommending LTAC setting 7/15: Tolerated trach collar yesterday 7/16: TCT x 2 hours 7/18 hgb 6.8 received 1 unit PRBC. LTACH appeal denied 7/22 Tolerating brief periods of trach collar and  PMV  Interim History / Subjective:   On PMV trials for brief periods. Periods of emesis  Objective:  Blood pressure (!) 156/112, pulse (!) 101, temperature 98.4 F (36.9 C), temperature source Oral, resp. rate 18, height 5\' 8"  (1.727 m), weight 104.5 kg, SpO2 100 %.    Vent Mode: PRVC FiO2 (%):  [30 %-40 %] 40 % Set Rate:  [20 bmp] 20 bmp Vt Set:  [510 mL] 510 mL PEEP:  [5 cmH20] 5 cmH20 Pressure Support:  [8 cmH20-12 cmH20] 12 cmH20 Plateau Pressure:  [20 cmH20-23 cmH20] 20 cmH20   Intake/Output Summary (Last 24 hours) at 01/22/2021 0801 Last data filed at 01/22/2021 0400 Gross per 24 hour  Intake 1070 ml  Output 201 ml  Net 869 ml   Physical Exam: Gen:      No acute distress HEENT:  EOMI, sclera anicteric Neck:     No masses; no thyromegaly, trach Lungs:    Clear to auscultation bilaterally; normal respiratory effort CV:         Regular rate and rhythm; no murmurs Abd:      + bowel sounds; soft, non-tender; no palpable masses, no distension Ext:    No edema; adequate peripheral perfusion Skin:      Warm and dry; no rash Neuro:Somnolent, arousable  Labs are stable. No new imaging  Resolved Hospital Problem List   Septic shock from pneumonia Left upper extremity redness and swelling resolved Relative adrenal insufficiency -Weaned off steroids 7/15 Acute metabolic encephalopathy 2nd to sepsis and hypercapnia - improved  Intermittent Fluid and electrolyte imbalance  Assessment & Plan:   Acute on chronic hypoxic/hypercapnic respiratory failure from acute pulmonary edema, pneumonia. Restrictive lung disease from kyphoscolosis Wleep disordered breathing (mix  of OHS and OSA) Failure to wean from vent s/p tracheostomy on 01/07/21. -Endurance and fatigue are largest barriers for progress here. LTACH denied. Family is very determined to get her home.  P: Routine trach care, Trach collar trials and PMV as tolerated, bronchial hygiene Goals is to get her to extended trach  collar during the day Intermittent chest x-ray  Hx of CVA, seizures, dementia, depression. P: On Keppra, neurontin  Severe Physical deconditioning P: PT/OT  Chronic permanent atrial fibrillation Acute on chronic combined CHF -ECHO 45-50% with global hypokinesis  HLD -Cardiology evaluated during admission, now signed off P: Continue amio, eliquis, statin and lopressor Increase lasix dose to 40 mg bid as she is I/O positive   DM type 2 poorly controlled with hyperglycemia -Glycemic control has been challenging. Adjusted bolus time short acting 7/14. Looks like this has helped.  P: BS under control Continue current regimen   Anemia of critical illness and chronic disease. -No evidence of bleeding but hgb downtreded to 6.8 7/18 for which patient received 1 unit PRBC  P: Intermittently trend CBC Transfuse per protocol  Hgb  goal > 7  Hypothyroidism P: Continue synthryroid  Dysphagia post PEG placement Emesis P: Holding tube feeds today due to new emesis Check abd x ray  Best Practice    Diet/type: tubefeeds DVT prophylaxis: DOAC GI prophylaxis: PPI Lines: Central line and yes and it is still needed Has PICC RUE  Foley:  N/A Code Status:  full code Last date of multidisciplinary goals of care discussion: Patient and family updated daily  Disposition: ICU;  Critical care:   The patient is critically ill with multiple organ system failure and requires high complexity decision making for assessment and support, frequent evaluation and titration of therapies, advanced monitoring, review of radiographic studies and interpretation of complex data.   Critical Care Time devoted to patient care services, exclusive of separately billable procedures, described in this note is 45 minutes.   Chilton Greathouse MD [facility] See Amion for pager  If no response to pager , please call [phone] until 7pm After 7:00 pm call Elink   [phone] 01/22/2021, 8:09 AM

## 2021-01-22 NOTE — Progress Notes (Signed)
eLink Physician-Brief Progress Note Patient Name: [name] DOB: May 13, 1951 MRN: [id number]   Date of Service  01/22/2021  HPI/Events of Note  Multiple issues: 1. Patient vomiting and meds per tube being held. Nursing request for one-time order for Keppra. 2. Sore throat. 3. Nursing request for sedation PRN - Patient refuses low dose Fentanyl IV.   eICU Interventions  Plan: Keppra 1000 mg IV now. Chloraseptic Spray 1 spray to throat PRN.      Intervention Category Major Interventions: Seizures - evaluation and management  [name] 01/22/2021, 10:05 PM

## 2021-01-22 NOTE — Progress Notes (Signed)
Physical Therapy Treatment Patient Details Name: [name] MRN: [id number] DOB: 1951-03-23 Today's Date: 01/22/2021    History of Present Illness [name] is a 70 y.o. female with medical history significant for obesity, type 2 diabetes, chronic atrial fibrillation on Eliquis, dementia, diastolic CHF with chronic hypoxemia on 2 L nasal cannula, hypothyroidism, seizure disorder, CKD stage III, CAD with prior NSTEMI, dyslipidemia, and GERD who presented to the ED with worsening shortness of breath over the past couple days.  Daughter at bedside states that she has had approximately 20 pound weight gain noted as well over the same timeframe.  Her symptoms were worsening and EMS was called and upon arrival she was noted to have pulse oximetry of 55%.  She was noted to be taking her medications normally according to the daughter and was wearing her home oxygen.  She denied any chest pain, cough, fevers, or chills.  Some mild lower extremity edema has been noted as well.  No other sick contacts and she has had her COVID vaccines and booster shots.  She appears to have been admitted just last month with acute on chronic diastolic CHF.  Her discharge weight at that time was 214 pounds.  She is currently at 227 pounds.    PT Comments    Pt more awake and able to participate today. Focused on trunk positioning working on bring trunk forward off of bed in chair position and LE strengthening. Will continue to try to progress strength and mobility.    Follow Up Recommendations  LTACH;Supervision/Assistance - 24 hour (dtr wants to take pt home so would maximize home services)     Equipment Recommendations  Wheelchair (measurements PT);Wheelchair cushion (measurements PT);[name] bed;Other (comment) (hoyer lift)    Recommendations for Other Services       Precautions / Restrictions Precautions Precautions: Fall Precaution Comments: vent with trach    Mobility  Bed Mobility Overal bed  mobility: Needs Assistance             General bed mobility comments: Placed pt in chair position in bed and brought trunk forward x 5 with +2 max to total assist    Transfers                    Ambulation/Gait                 Stairs             Wheelchair Mobility    Modified Rankin (Stroke Patients Only)       Balance Overall balance assessment: Needs assistance Sitting-balance support: Bilateral upper extremity supported Sitting balance-Leahy Scale: Zero Sitting balance - Comments: +2 total assist to keep trunk forward from [facility]                                    Cognition Arousal/Alertness: Awake/alert Behavior During Therapy: WFL for tasks assessed/performed Overall Cognitive Status: Difficult to assess                                 General Comments: Pt following 1 step commands with incr time, slow to process, intermittent closing of eyes      Exercises General Exercises - Lower Extremity Short Arc QuadBarbaraann [name];Both;5 reps;Seated (chair position) Heel Slides: AAROM;Both;10 reps;Supine    General Comments  Pertinent Vitals/Pain      Home Living                      Prior Function            PT Goals (current goals can now be found in the care plan section) Acute Rehab PT Goals Patient Stated Goal: To get stronger Progress towards PT goals: Not progressing toward goals - comment    Frequency    Min 3X/week      PT Plan Current plan remains appropriate    Co-evaluation              AM-PAC PT "6 Clicks" Mobility   Outcome Measure  Help needed turning from your back to your side while in a flat bed without using bedrails?: A Lot Help needed moving from lying on your back to sitting on the side of a flat bed without using bedrails?: Total Help needed moving to and from a bed to a chair (including a wheelchair)?: Total Help needed standing up from a chair using your  arms (e.g., wheelchair or bedside chair)?: Total Help needed to walk in [name] room?: Total Help needed climbing 3-5 steps with a railing? : Total 6 Click Score: 7    End of Session   Activity Tolerance: Patient limited by fatigue Patient left: with call bell/phone within reach;with family/visitor present;in bed;with bed alarm set   PT Visit Diagnosis: Unsteadiness on feet (R26.81);Muscle weakness (generalized) (M62.81);Difficulty in walking, not elsewhere classified (R26.2)     Time: 6314-9702 PT Time Calculation (min) (ACUTE ONLY): 20 min  Charges:  $Therapeutic Exercise: 8-22 mins                     [facility] PT Acute Rehabilitation Services Pager [phone] Office [phone]    [name] 01/22/2021, 5:02 PM

## 2021-01-22 NOTE — Progress Notes (Signed)
eLink Physician-Brief Progress Note Patient Name: [name] DOB: 09-12-1950 MRN: [id number]   Date of Service  01/22/2021  HPI/Events of Note  N/V - Zofran given at 11 PM.   eICU Interventions  Plan: Compazine 10 mg IV X 1 now.     Intervention Category Major Interventions: Other:  Hildegard Hlavac Dennard Nip 01/22/2021, 1:36 AM

## 2021-01-22 NOTE — Progress Notes (Signed)
Patient had multiple emesis episodes overnight  2130: Large emesis of tube feed approximately 1 hour after tube feed bolus. N/V resolved without Zofran. 2300: Vomiting restarts, light tan, IV Zofran 4mg  administered 0145: Vomiting restarts, 10mg  IV Compazine  0548: Small amount of clear emesis with gagging, Zofran given. : Medium sized emesis, straight green bile.

## 2021-01-22 NOTE — Progress Notes (Signed)
RT noted patient coughing up bright red blood. Patient's omniflex and HME was full of blood when assessing this patient. Omniflew, HME, and suction catheter changed out at this time. RN and CCM are aware of patient coughing up blood.

## 2021-01-23 ENCOUNTER — Inpatient Hospital Stay (HOSPITAL_COMMUNITY): Payer: PPO

## 2021-01-23 ENCOUNTER — Encounter (HOSPITAL_COMMUNITY)
Admission: EM | Disposition: A | Discharge status: Home Health | Payer: Self-pay | Source: Home / Self Care | Attending: Internal Medicine

## 2021-01-23 ENCOUNTER — Encounter (HOSPITAL_COMMUNITY): Payer: Self-pay | Admitting: Pulmonary Disease

## 2021-01-23 DIAGNOSIS — Z930 Tracheostomy status: Secondary | ICD-10-CM | POA: Diagnosis not present

## 2021-01-23 DIAGNOSIS — J9622 Acute and chronic respiratory failure with hypercapnia: Secondary | ICD-10-CM | POA: Diagnosis not present

## 2021-01-23 DIAGNOSIS — J9621 Acute and chronic respiratory failure with hypoxia: Secondary | ICD-10-CM | POA: Diagnosis not present

## 2021-01-23 DIAGNOSIS — J9811 Atelectasis: Secondary | ICD-10-CM | POA: Diagnosis not present

## 2021-01-23 HISTORY — PX: FOREIGN BODY REMOVAL: SHX962

## 2021-01-23 HISTORY — PX: CRYOTHERAPY: SHX6894

## 2021-01-23 HISTORY — PX: VIDEO BRONCHOSCOPY: SHX5072

## 2021-01-23 LAB — CBC
HCT: 28.1 % — ABNORMAL LOW (ref 36.0–46.0)
Hemoglobin: 8.6 g/dL — ABNORMAL LOW (ref 12.0–15.0)
MCH: 29.1 pg (ref 26.0–34.0)
MCHC: 30.6 g/dL (ref 30.0–36.0)
MCV: 94.9 fL (ref 80.0–100.0)
Platelets: 245 10*3/uL (ref 150–400)
RBC: 2.96 MIL/uL — ABNORMAL LOW (ref 3.87–5.11)
RDW: 19.7 % — ABNORMAL HIGH (ref 11.5–15.5)
WBC: 12.6 10*3/uL — ABNORMAL HIGH (ref 4.0–10.5)
nRBC: 0 % (ref 0.0–0.2)

## 2021-01-23 LAB — BASIC METABOLIC PANEL
Anion gap: 10 (ref 5–15)
BUN: 26 mg/dL — ABNORMAL HIGH (ref 8–23)
CO2: 25 mmol/L (ref 22–32)
Calcium: 8.7 mg/dL — ABNORMAL LOW (ref 8.9–10.3)
Chloride: 108 mmol/L (ref 98–111)
Creatinine, Ser: 0.87 mg/dL (ref 0.44–1.00)
GFR, Estimated: >60 mL/min (ref >60)
Glucose, Bld: 163 mg/dL — ABNORMAL HIGH (ref 70–99)
Potassium: 3.9 mmol/L (ref 3.5–5.1)
Sodium: 143 mmol/L (ref 135–145)

## 2021-01-23 LAB — MAGNESIUM: Magnesium: 2.2 mg/dL (ref 1.7–2.4)

## 2021-01-23 LAB — GLUCOSE, CAPILLARY
Glucose-Capillary: 120 mg/dL — ABNORMAL HIGH (ref 70–99)
Glucose-Capillary: 125 mg/dL — ABNORMAL HIGH (ref 70–99)
Glucose-Capillary: 138 mg/dL — ABNORMAL HIGH (ref 70–99)
Glucose-Capillary: 145 mg/dL — ABNORMAL HIGH (ref 70–99)
Glucose-Capillary: 148 mg/dL — ABNORMAL HIGH (ref 70–99)
Glucose-Capillary: 154 mg/dL — ABNORMAL HIGH (ref 70–99)

## 2021-01-23 LAB — PHOSPHORUS: Phosphorus: 4.6 mg/dL (ref 2.5–4.6)

## 2021-01-23 SURGERY — VIDEO BRONCHOSCOPY WITHOUT FLUORO
Laterality: Bilateral

## 2021-01-23 MED ORDER — THIAMINE HCL 100 MG/ML IJ SOLN: 100.0000 mg | Freq: Every day | INTRAMUSCULAR | Status: DC

## 2021-01-23 MED ORDER — FENTANYL CITRATE (PF) 100 MCG/2ML IJ SOLN
100.0000 ug | Freq: Once | INTRAMUSCULAR | Status: AC
  Administered 2021-01-23: 100 ug via INTRAVENOUS

## 2021-01-23 MED ORDER — FENTANYL CITRATE (PF) 100 MCG/2ML IJ SOLN
100.0000 ug | Freq: Once | INTRAMUSCULAR | Status: AC
Start: 2021-01-23 — End: 2021-01-23
  Administered 2021-01-23: 100 ug via INTRAVENOUS

## 2021-01-23 MED ORDER — ROCURONIUM BROMIDE 10 MG/ML (PF) SYRINGE
PREFILLED_SYRINGE | INTRAVENOUS | Status: AC
  Filled 2021-01-23: qty 10

## 2021-01-23 MED ORDER — NOREPINEPHRINE 4 MG/250ML-% IV SOLN
INTRAVENOUS | Status: AC
  Filled 2021-01-23: qty 250

## 2021-01-23 MED ORDER — ROCURONIUM BROMIDE 50 MG/5ML IV SOLN
50.0000 mg | Freq: Once | INTRAVENOUS | Status: AC
  Administered 2021-01-23: 50 mg via INTRAVENOUS

## 2021-01-23 MED ORDER — ETOMIDATE 2 MG/ML IV SOLN
20.0000 mg | Freq: Once | INTRAVENOUS | Status: AC
  Administered 2021-01-23: 20 mg via INTRAVENOUS

## 2021-01-23 MED ORDER — FENTANYL CITRATE (PF) 100 MCG/2ML IJ SOLN
INTRAMUSCULAR | Status: AC
  Filled 2021-01-23: qty 2

## 2021-01-23 MED ORDER — MIDAZOLAM HCL 2 MG/2ML IJ SOLN
2.0000 mg | Freq: Once | INTRAMUSCULAR | Status: AC
  Administered 2021-01-23: 2 mg via INTRAVENOUS

## 2021-01-23 MED ORDER — MIDAZOLAM HCL 2 MG/2ML IJ SOLN
INTRAMUSCULAR | Status: AC
  Filled 2021-01-23: qty 2

## 2021-01-23 MED ORDER — SODIUM CHLORIDE 0.9 % IV SOLN: 1.0000 mg | Freq: Every day | INTRAVENOUS | Status: DC

## 2021-01-23 MED ORDER — ETOMIDATE 2 MG/ML IV SOLN
INTRAVENOUS | Status: AC
  Filled 2021-01-23: qty 20

## 2021-01-23 MED ORDER — ACETYLCYSTEINE 20 % IN SOLN
4.0000 mL | Freq: Three times a day (TID) | RESPIRATORY_TRACT | Status: DC
  Administered 2021-01-24 – 2021-01-27 (×9): 4 mL via RESPIRATORY_TRACT
  Filled 2021-01-23 (×16): qty 4

## 2021-01-23 MED ORDER — MIDAZOLAM HCL (PF) 5 MG/ML IJ SOLN
INTRAMUSCULAR | Status: AC
  Filled 2021-01-23: qty 1

## 2021-01-23 MED ORDER — PROPOFOL 1000 MG/100ML IV EMUL
0.0000 ug/kg/min | INTRAVENOUS | Status: DC
  Administered 2021-01-23 (×3): 30 ug/kg/min via INTRAVENOUS
  Administered 2021-01-24 (×4): 25 ug/kg/min via INTRAVENOUS
  Administered 2021-01-25: 20 ug/kg/min via INTRAVENOUS
  Administered 2021-01-25 – 2021-01-27 (×4): 5 ug/kg/min via INTRAVENOUS
  Filled 2021-01-23 (×10): qty 100

## 2021-01-23 MED ORDER — FENTANYL CITRATE (PF) 100 MCG/2ML IJ SOLN: 25.0000 ug | INTRAMUSCULAR | Status: DC | PRN

## 2021-01-23 NOTE — Procedures (Signed)
Intubation Procedure Note  [name]  [id number]  09/10/1950  Date:01/23/21  Time:11:29 AM   Provider Performing:Roshawnda Pecora V. Azaria Bartell    Procedure: Intubation (31500)  Indication(s) Respiratory Failure Large clot which could not be suctioned out via #6 tstomy  Consent Risks of the procedure as well as the alternatives and risks of each were explained to the patient and/or caregiver.  Consent for the procedure was obtained and is signed in the bedside chart   Anesthesia Etomidate, Versed, Fentanyl, and Rocuronium   Time Out Verified patient identification, verified procedure, site/side was marked, verified correct patient position, special equipment/implants available, medications/allergies/relevant history reviewed, required imaging and test results available.   Sterile Technique Usual hand hygeine, masks, and gloves were used   Procedure Description Patient positioned in bed supine.  Sedation given as noted above.  Patient was intubated with endotracheal tube using Glidescope.  View was Grade 1 full glottis .  Number of attempts was 1.  Colorimetric CO2 detector was consistent with tracheal placement.   Complications/Tolerance None; patient tolerated the procedure well. Bscopy  verified placement.   EBL Minimal   Specimen(s) None  [name] [name] MD

## 2021-01-23 NOTE — Procedures (Signed)
Bronchoscopy Procedure Note  MERRIE EPLER  [id number]  [birth year]  Date:01/23/21  Time:11:24 AM   Provider Performing:Nivin Braniff V. Frieda Arnall   Procedure(s):  Flexible Bronchoscopy [phone])  Indication(s) Left atelectasis  Consent Risks of the procedure as well as the alternatives and risks of each were explained to the patient and/or caregiver.  Consent for the procedure was obtained and is signed in the bedside chart  Anesthesia Versed 1  fent 100   Time Out Verified patient identification, verified procedure, site/side was marked, verified correct patient position, special equipment/implants available, medications/allergies/relevant history reviewed, required imaging and test results available.   Sterile Technique Usual hand hygiene, masks, gowns, and gloves were used   Procedure Description Bronchoscope advanced through tracheostomy tube and into airway.  Airways were examined down to subsegmental level with findings noted below.   Following diagnostic evaluation  Findings: Fleshy mass like lesion noted in the distal trachea, could not be suctioned out (see pictures) inspite of using a larger scope DD includes clot, unlikely tumor since recent bronchoscopy for tstomy had not shown this  Plantis to change to ETT upsizxze to 9.0 then attempt bronch with cryo    Complications/Tolerance None; patient tolerated the procedure well. Chest X-ray is not needed post procedure.   EBL Minimal   Specimen(s) None  Artesia Berkey V. Elsworth Soho MD

## 2021-01-23 NOTE — Progress Notes (Signed)
NAME:  [name], MRN:  [id number], DOB:  July 18, 1950, LOS: 20 ADMISSION DATE:  01/03/2021, CONSULTATION DATE:  01/03/2021 REFERRING MD:  EDP - [facility] CHIEF COMPLAINT:  SOB, encephalopathy   History of Present Illness:  70 yo female presented to [facility] on 7/03 with dyspnea, and altered mental status.    Had admission from 12/21/20 to 12/30/20 for respiratory failure and CHF exacerbation.Transferred to [facility]. SpO2 65% and elevated PCO2.  Failed Bipap and required intubation. Course since then complicated by prolonged mechanical ventilation requiring tracheostomy 7/7  Pertinent  Medical History  Kyphoscoliosis, Restrictive lung disease, Chronic respiratory failure on 2 liters, DM type 2, Chronic a fib on eliquis, Dementia, Chronic combined CHF, Hypothyroidism, Seizures, CKD 3a, CAD s/p NSTEMI, HLD, GERD  Significant Hospital Events:   7/3  chronic HCRF from OHS and restrictive lung dz as well as deconditioning-->progression -->decompensated HFpEF and cor pulmonale-->worsening metabolic enceph from HCRF-->PNA --> acute on chronic resp failure. Admitted. Intubated. Ceftriaxone 7/3 x 1. Azith and Cefepime started.  7/4-7/5 Hypotension overnight. A line placed. LR bolus. Started on NE. RUE PICC placed 7/6 Hgb 6.7 (7.4), CT A/P completed to r/o RP bleed (negative), showed mild peripancreatic stranding. Lipase slightly elevated. 1U PRBCs ordered. 7/7 Tracheostomy placed, heparin gtt held for procedure. 7/8 Increased agitation with Propofol wean, increased HR, uptrending pressor requirements. Cortrak placed. Azith and cefepime stopped.  7/11 PEG tube placed, tolerating PSV 7/12 ATC trials started. Tubefeeds started/resumed 7/13 solucortef decreased to daily. Midodrine stopped. Heparin gtt stopped. Stated back on DOAC. PANDA removed 7/14: sat at side of bed w/ max assist for 15 minutes. Fatigued quickly. PT recommending LTAC setting 7/15: Tolerated trach collar yesterday 7/16: TCT x 2 hours 7/18 hgb 6.8  received 1 unit PRBC. LTACH appeal denied 7/22 Tolerating brief periods of trach collar and PMV, Periods of emesis >> TFs held  Interim History / Subjective:   Remains chronic critically ill. Tube feeds have been held due to emesis Bright red blood noted by RT around 7:30 PM  Objective:  Blood pressure (!) 159/92, pulse (!) 109, temperature 98 F (36.7 C), temperature source Oral, resp. rate (!) 24, height 5\' 8"  (1.727 m), weight 103.6 kg, SpO2 96 %.    Vent Mode: PRVC FiO2 (%):  [40 %] 40 % Set Rate:  [20 bmp] 20 bmp Vt Set:  [510 mL] 510 mL PEEP:  [5 cmH20] 5 cmH20 Plateau Pressure:  [15 cmH20-30 cmH20] 15 cmH20   Intake/Output Summary (Last 24 hours) at 01/23/2021 01/25/2021 Last data filed at 01/23/2021 0458 Gross per 24 hour  Intake 1000 ml  Output 850 ml  Net 150 ml    Physical Exam: Gen:      No acute distress, on vent HEENT:  EOMI, sclera anicteric Neck:     No masses; no thyromegaly, trach Lungs:    No accessory muscle use, decreased breath sounds on left CV:         S1-S2 regular, no murmur Abd:      + bowel sounds; soft, non-tender; no palpable masses, no distension Ext:    No edema; adequate peripheral perfusion Skin:      Warm and dry; no rash Neuro: Awake and interactive, nonfocal  Chest x-ray 7/23 independently reviewed shows complete atelectasis of left lung Labs showed normal electrolytes, mild leukocytosis, stable anemia  Resolved Hospital Problem List   Septic shock from pneumonia Left upper extremity redness and swelling resolved Relative adrenal insufficiency -Weaned off steroids 7/15 Acute metabolic  encephalopathy 2nd to sepsis and hypercapnia - improved    Assessment & Plan:   Acute on chronic hypoxic/hypercapnic respiratory failure from acute pulmonary edema, pneumonia. Restrictive lung disease from kyphoscolosis Sleep disordered breathing (mix of OHS and OSA) Failure to wean from vent s/p tracheostomy on 01/07/21. -Endurance and fatigue are  largest barriers for progress here. LTACH denied. Family is very determined to get her home.  7/23 left atelectasis P: Routine trach care, We will offer bronchoscopy for left-sided atelectasis and she was tolerating some trach collar trials prior to this and given episode of hemoptysis Trach collar trials and PMV as tolerated, bronchial hygiene Goals is to get her to extended trach collar during the day Intermittent chest x-ray  Hx of CVA, seizures, dementia, depression. P: On Keppra, neurontin  Severe Physical deconditioning P: PT/OT  Chronic permanent atrial fibrillation Acute on chronic combined CHF -ECHO 45-50% with global hypokinesis  HLD -Cardiology evaluated during admission, now signed off P: Continue amio,  statin and lopressor Increase lasix dose to 40 mg bid for positive balance. Eliquis on hold  DM type 2 poorly controlled with hyperglycemia -Adjusted bolus time short acting 7/14, appears controlled P:  Continue current regimen   Anemia of critical illness and chronic disease. -No evidence of bleeding but hgb downtreded to 6.8 7/18 for which patient received 1 unit PRBC  P: Intermittently trend CBC Transfuse per protocol  Hgb  goal > 7  Hypothyroidism P: Continue synthryroid  Dysphagia post PEG placement Emesis x-ray abdomen 7/22 suggests possible ileus P: Resume trickle tube feeds   Best Practice    Diet/type: tubefeeds DVT prophylaxis: SCD GI prophylaxis: PPI Lines: Central line and yes and it is still needed Has PICC RUE  Foley:  N/A Code Status:  full code Last date of multidisciplinary goals of care discussion: Patient and daughter updated daily  Disposition: ICU;  Critical care:   The patient is critically ill with multiple organ system failure and requires high complexity decision making for assessment and support, frequent evaluation and titration of therapies, advanced monitoring, review of radiographic studies and interpretation of  complex data.   Critical Care Time devoted to patient care services, exclusive of separately billable procedures, described in this note is 35 minutes.   Cyril Mourning MD. Tonny Bollman. [facility] [facility] Pager : [phone]  If no response to pager , please call [phone] until 7 pm After 7:00 pm call Elink  [phone]     01/23/2021, 9:03 AM

## 2021-01-23 NOTE — Procedures (Signed)
Bronchoscopy Procedure Note  DAILY DOE  [id number]  07-Jul-1950  Date:01/23/21  Time:12:25 PM   Provider Performing:Corleone Biegler C Katrinka Blazing   Procedure(s):   15056 Bronchoscopy with foreign body removal  Indication(s) Tracheal occlusion  Consent Risks of the procedure as well as the alternatives and risks of each were explained to the patient and/or caregiver.  Consent for the procedure was obtained and is signed in the bedside chart  Anesthesia Propofol in place   Time Out Verified patient identification, verified procedure, site/side was marked, verified correct patient position, special equipment/implants available, medications/allergies/relevant history reviewed, required imaging and test results available.   Sterile Technique Usual hand hygiene, masks, gowns, and gloves were used   Procedure Description Bronchoscope advanced through endotracheal tube and into airway.  Airways were examined down to subsegmental level with findings noted below.    Following diagnostic evaluation, extensive time spent removing inspissated clot from trachea down to distal left mainstem using primarily cryoprobe.  Clot in LUL and LLL remains nearly completely obstructed.  Going to let rest today, try some CPT and mucomyst and repeat procedure tomorrow.  Findings:  - Near compelte occlusion of distal trachea from inspissated clot removed - Complete occlusion of left mainstem bronchus with inspissated clot removed - Ongoing near complete occlusion of LUL and LLL continues to be tough to remove - No obvious proximal source of bleeding    Complications/Tolerance None; patient tolerated the procedure well. Chest X-ray is not needed post procedure.   EBL Minimal   Specimen(s) None

## 2021-01-24 ENCOUNTER — Encounter (HOSPITAL_COMMUNITY): Payer: Self-pay | Admitting: Pulmonary Disease

## 2021-01-24 ENCOUNTER — Inpatient Hospital Stay (HOSPITAL_COMMUNITY): Payer: PPO

## 2021-01-24 ENCOUNTER — Encounter (HOSPITAL_COMMUNITY)
Admission: EM | Disposition: A | Discharge status: Home Health | Payer: Self-pay | Source: Home / Self Care | Attending: Internal Medicine

## 2021-01-24 DIAGNOSIS — J9602 Acute respiratory failure with hypercapnia: Secondary | ICD-10-CM | POA: Diagnosis not present

## 2021-01-24 DIAGNOSIS — J9621 Acute and chronic respiratory failure with hypoxia: Secondary | ICD-10-CM | POA: Diagnosis not present

## 2021-01-24 DIAGNOSIS — J9811 Atelectasis: Secondary | ICD-10-CM | POA: Diagnosis not present

## 2021-01-24 DIAGNOSIS — J9622 Acute and chronic respiratory failure with hypercapnia: Secondary | ICD-10-CM | POA: Diagnosis not present

## 2021-01-24 HISTORY — PX: FOREIGN BODY REMOVAL: SHX962

## 2021-01-24 HISTORY — PX: VIDEO BRONCHOSCOPY: SHX5072

## 2021-01-24 HISTORY — PX: CRYOTHERAPY: SHX6894

## 2021-01-24 HISTORY — PX: HEMOSTASIS CONTROL: SHX6838

## 2021-01-24 LAB — GLUCOSE, CAPILLARY
Glucose-Capillary: 127 mg/dL — ABNORMAL HIGH (ref 70–99)
Glucose-Capillary: 130 mg/dL — ABNORMAL HIGH (ref 70–99)
Glucose-Capillary: 134 mg/dL — ABNORMAL HIGH (ref 70–99)
Glucose-Capillary: 134 mg/dL — ABNORMAL HIGH (ref 70–99)
Glucose-Capillary: 158 mg/dL — ABNORMAL HIGH (ref 70–99)
Glucose-Capillary: 177 mg/dL — ABNORMAL HIGH (ref 70–99)

## 2021-01-24 LAB — CBC
HCT: 26.4 % — ABNORMAL LOW (ref 36.0–46.0)
Hemoglobin: 8.3 g/dL — ABNORMAL LOW (ref 12.0–15.0)
MCH: 29.3 pg (ref 26.0–34.0)
MCHC: 31.4 g/dL (ref 30.0–36.0)
MCV: 93.3 fL (ref 80.0–100.0)
Platelets: 226 10*3/uL (ref 150–400)
RBC: 2.83 MIL/uL — ABNORMAL LOW (ref 3.87–5.11)
RDW: 19.7 % — ABNORMAL HIGH (ref 11.5–15.5)
WBC: 10.6 10*3/uL — ABNORMAL HIGH (ref 4.0–10.5)
nRBC: 0.2 % (ref 0.0–0.2)

## 2021-01-24 LAB — BASIC METABOLIC PANEL
Anion gap: 8 (ref 5–15)
BUN: 22 mg/dL (ref 8–23)
CO2: 24 mmol/L (ref 22–32)
Calcium: 8.3 mg/dL — ABNORMAL LOW (ref 8.9–10.3)
Chloride: 110 mmol/L (ref 98–111)
Creatinine, Ser: 0.89 mg/dL (ref 0.44–1.00)
GFR, Estimated: >60 mL/min (ref >60)
Glucose, Bld: 140 mg/dL — ABNORMAL HIGH (ref 70–99)
Potassium: 3.3 mmol/L — ABNORMAL LOW (ref 3.5–5.1)
Sodium: 142 mmol/L (ref 135–145)

## 2021-01-24 LAB — PHOSPHORUS: Phosphorus: 3.7 mg/dL (ref 2.5–4.6)

## 2021-01-24 LAB — MAGNESIUM: Magnesium: 2.1 mg/dL (ref 1.7–2.4)

## 2021-01-24 LAB — TRIGLYCERIDES: Triglycerides: 75 mg/dL (ref <150)

## 2021-01-24 SURGERY — VIDEO BRONCHOSCOPY WITHOUT FLUORO
Laterality: Bilateral

## 2021-01-24 MED ORDER — MIDAZOLAM HCL (PF) 5 MG/ML IJ SOLN
INTRAMUSCULAR | Status: AC
  Filled 2021-01-24: qty 1

## 2021-01-24 MED ORDER — POTASSIUM CHLORIDE 20 MEQ PO PACK
20.0000 meq | PACK | ORAL | Status: AC
  Administered 2021-01-24 (×2): 20 meq
  Filled 2021-01-24 (×2): qty 1

## 2021-01-24 MED ORDER — POTASSIUM CHLORIDE 10 MEQ/50ML IV SOLN
10.0000 meq | INTRAVENOUS | Status: AC
  Administered 2021-01-24 (×4): 10 meq via INTRAVENOUS
  Filled 2021-01-24 (×4): qty 50

## 2021-01-24 MED ORDER — ROCURONIUM BROMIDE 10 MG/ML (PF) SYRINGE
PREFILLED_SYRINGE | INTRAVENOUS | Status: AC
  Administered 2021-01-24: 50 mg
  Filled 2021-01-24: qty 10

## 2021-01-24 MED ORDER — FENTANYL CITRATE (PF) 100 MCG/2ML IJ SOLN
INTRAMUSCULAR | Status: DC | PRN
  Administered 2021-01-24: 50 ug via INTRAVENOUS

## 2021-01-24 MED ORDER — GUAIFENESIN 100 MG/5ML PO SOLN
5.0000 mL | Freq: Four times a day (QID) | ORAL | Status: DC | PRN
  Administered 2021-01-29 – 2021-02-10 (×3): 100 mg
  Filled 2021-01-24 (×4): qty 5

## 2021-01-24 MED ORDER — ETOMIDATE 2 MG/ML IV SOLN
INTRAVENOUS | Status: AC
  Administered 2021-01-24: 20 mg
  Filled 2021-01-24: qty 20

## 2021-01-24 MED ORDER — TRANEXAMIC ACID FOR INHALATION
500.0000 mg | Freq: Three times a day (TID) | RESPIRATORY_TRACT | Status: AC
  Administered 2021-01-24 – 2021-01-26 (×6): 500 mg via RESPIRATORY_TRACT
  Filled 2021-01-24 (×6): qty 10

## 2021-01-24 MED ORDER — FENTANYL CITRATE (PF) 100 MCG/2ML IJ SOLN
INTRAMUSCULAR | Status: AC
  Filled 2021-01-24: qty 2

## 2021-01-24 NOTE — Progress Notes (Signed)
[facility] ADULT ICU REPLACEMENT PROTOCOL   The patient does apply for the [facility] Adult ICU Electrolyte Replacment Protocol based on the criteria listed below:   1.Exclusion criteria: TCTS patients, ECMO patients and Hypothermia Protocol, and   Dialysis patients 2. Is GFR >/= 30 ml/min? Yes.    Patient's GFR today is >60 3. Is SCr </= 2? Yes.   Patient's SCr is 0.89 mg/dL 4. Did SCr increase >/= 0.5 in 24 hours? No. 5.Pt's weight >40kg  Yes.   6. Abnormal electrolyte(s): K+ 3.3  7. Electrolytes replaced per protocol 8.  Call MD STAT for K+ </= 2.5, Phos </= 1, or Mag </= 1 Physician:  Dr.Stretch  Lolita Lenz 01/24/2021 4:41 AM

## 2021-01-24 NOTE — Procedures (Signed)
Bronchoscopy procedure note Consent signed in chart Sedation: pushes of fentanyl/prop/versed/rocuronium Indication: plugging of bronchi  Description: Therapeutic scope advanced through ETT into trachea.  Occlusion seen of LUL and LLL, successfully suctioned down to segmental level with removal of large amounts of fibrinous blood clot using combination cryoprobe and forceps.  Tenacious inspissated clot remains more distally unable to suction. Oozing of underlying mucosa controlled with cold saline  P: Start TXA Continue vent support via ETT, keep eye on trach stoma Take another look in a couple days Updated family  Myrla Halsted MD PCCM

## 2021-01-24 NOTE — Progress Notes (Signed)
NAME:  OTHELLO SGROI, MRN:  [id number], DOB:  1950-09-20, LOS: 21 ADMISSION DATE:  01/03/2021, CONSULTATION DATE:  01/03/2021 REFERRING MD:  EDP - [facility] CHIEF COMPLAINT:  SOB, encephalopathy   History of Present Illness:  70 yo female presented to [facility] on 7/03 with dyspnea, and altered mental status.    Had admission from 12/21/20 to 12/30/20 for respiratory failure and CHF exacerbation.Transferred to [facility]. SpO2 65% and elevated PCO2.  Failed Bipap and required intubation. Course since then complicated by prolonged mechanical ventilation requiring tracheostomy 7/7  Pertinent  Medical History  Kyphoscoliosis, Restrictive lung disease, Chronic respiratory failure on 2 liters, DM type 2, Chronic a fib on eliquis, Dementia, Chronic combined CHF, Hypothyroidism, Seizures, CKD 3a, CAD s/p NSTEMI, HLD, GERD  Significant Hospital Events:   7/3  chronic HCRF from OHS and restrictive lung dz as well as deconditioning-->progression -->decompensated HFpEF and cor pulmonale-->worsening metabolic enceph from HCRF-->PNA --> acute on chronic resp failure. Admitted. Intubated. Ceftriaxone 7/3 x 1. Azith and Cefepime started.  7/4-7/5 Hypotension overnight. A line placed. LR bolus. Started on NE. RUE PICC placed 7/6 Hgb 6.7 (7.4), CT A/P completed to r/o RP bleed (negative), showed mild peripancreatic stranding. Lipase slightly elevated. 1U PRBCs ordered. 7/7 Tracheostomy placed, heparin gtt held for procedure. 7/8 Increased agitation with Propofol wean, increased HR, uptrending pressor requirements. Cortrak placed. Azith and cefepime stopped.  7/11 PEG tube placed, tolerating PSV 7/12 ATC trials started. Tubefeeds started/resumed 7/13 solucortef decreased to daily. Midodrine stopped. Heparin gtt stopped. Stated back on DOAC. PANDA removed 7/14: sat at side of bed w/ max assist for 15 minutes. Fatigued quickly. PT recommending LTAC setting 7/15: Tolerated trach collar yesterday 7/16: TCT x 2 hours 7/18 hgb 6.8  received 1 unit PRBC. LTACH appeal denied 7/22 Tolerating brief periods of trach collar and PMV, Periods of emesis >> TFs held, Bright red blood noted by RT  7/23 left atelectasis, bronchoscopy showing inspissated clot occluding distal trachea, needed cryo probe to remove clot down to left mainstem 7/20 4 repeat bronchoscopy with cryoprobe to remove residual clot left upper lobe and lower lobe, oozing of underlying mucosa, distal clot remains  Interim History / Subjective:   Critically ill, intubated Sedated on propofol Underwent repeat bronchoscopy today   Objective:  Blood pressure 129/81, pulse 63, temperature 98.3 F (36.8 C), temperature source Oral, resp. rate (!) 22, height 5\' 8"  (1.727 m), weight 101.9 kg, SpO2 100 %.    Vent Mode: PRVC FiO2 (%):  [40 %-100 %] 100 % Set Rate:  [20 bmp-22 bmp] 22 bmp Vt Set:  [510 mL] 510 mL PEEP:  [5 cmH20] 5 cmH20 Plateau Pressure:  [19 cmH20-27 cmH20] 26 cmH20   Intake/Output Summary (Last 24 hours) at 01/24/2021 1107 Last data filed at 01/24/2021 0700 Gross per 24 hour  Intake 1395.96 ml  Output 550 ml  Net 845.96 ml    Physical Exam: Gen:      No acute distress, on vent HEENT:  EOMI, sclera anicteric Neck:     No masses; no thyromegaly, trach Lungs:    No accessory muscle use, decreased breath sounds on left CV:         S1-S2 regular, no murmur Abd:      + bowel sounds; soft, non-tender; no palpable masses, no distension Ext:    No edema; adequate peripheral perfusion Skin:      Warm and dry; no rash Neuro: Awake and interactive, nonfocal  Chest x-ray 7/ 24 independently reviewed  shows persistent left Hemi opacity, ET tube in position Labs showed decrease leukocytosis, stable anemia, mild hypokalemia  Resolved Hospital Problem List   Septic shock from pneumonia Left upper extremity redness and swelling resolved Relative adrenal insufficiency -Weaned off steroids 7/15 Acute metabolic encephalopathy 2nd to sepsis and  hypercapnia - improved    Assessment & Plan:   Acute on chronic hypoxic/hypercapnic respiratory failure from acute pulmonary edema, pneumonia. Restrictive lung disease from kyphoscolosis Sleep disordered breathing (mix of OHS and OSA) Failure to wean from vent s/p tracheostomy on 01/07/21. -Endurance and fatigue are largest barriers for progress here. LTACH denied. Family is very determined to get her home.  7/23 left atelectasis >> inspissated clot, bronchoscopy with cryoprobe P: Shiley #6 removed and orally intubated with 9.0 ETT for bronchoscopy and removal of large clot/foreign body Still has residual clot in left upper lobe and lower lobe and may need another procedure-Repeat chest x-ray in a.m. Eventually will need repeat tracheostomy inserted and restart spontaneous breathing trials Tracheobronchial toilet, tranexamic nebs and chest PT   Hx of CVA, seizures, dementia, depression. P: On Keppra, neurontin  Severe Physical deconditioning P: PT/OT when able  Chronic permanent atrial fibrillation Acute on chronic combined CHF -ECHO 45-50% with global hypokinesis  HLD -Cardiology evaluated during admission, now signed off P: Continue amio,  statin and lopressor  lasix dose to 40 mg bid  Eliquis on hold  DM type 2 poorly controlled with hyperglycemia -appears controlled P: Continue current regimen of 10 units Lantus and tube feed coverage 10 units 4 times daily   Anemia of critical illness and chronic disease. -No evidence of bleeding but hgb downtreded to 6.8 7/18 for which patient received 1 unit PRBC  P: Intermittently trend CBC Transfuse per protocol  Hgb  goal > 7  Hypothyroidism P: Continue synthryroid  Dysphagia post PEG placement Emesis x-ray abdomen 7/22 suggests possible ileus P: 7/24 resume trickle tube feeds   Best Practice    Diet/type: tubefeeds DVT prophylaxis: SCD GI prophylaxis: PPI Lines: Central line and yes and it is still needed Has  PICC RUE  Foley:  N/A Code Status:  full code Last date of multidisciplinary goals of care discussion: Patient and daughter updated daily  Disposition: ICU;  Critical care:   The patient is critically ill with multiple organ system failure and requires high complexity decision making for assessment and support, frequent evaluation and titration of therapies, advanced monitoring, review of radiographic studies and interpretation of complex data.   Critical Care Time devoted to patient care services, exclusive of separately billable procedures, described in this note is 34 minutes.   Cyril Mourning MD. Tonny Bollman.  Pulmonary & Critical care Pager : [phone]  If no response to pager , please call [phone] until 7 pm After 7:00 pm call Elink  [phone]     01/24/2021, 11:07 AM

## 2021-01-24 NOTE — Plan of Care (Signed)

## 2021-01-25 ENCOUNTER — Inpatient Hospital Stay (HOSPITAL_COMMUNITY): Payer: PPO

## 2021-01-25 DIAGNOSIS — J9621 Acute and chronic respiratory failure with hypoxia: Secondary | ICD-10-CM | POA: Diagnosis not present

## 2021-01-25 DIAGNOSIS — J9601 Acute respiratory failure with hypoxia: Secondary | ICD-10-CM | POA: Diagnosis not present

## 2021-01-25 DIAGNOSIS — R042 Hemoptysis: Secondary | ICD-10-CM

## 2021-01-25 DIAGNOSIS — J9622 Acute and chronic respiratory failure with hypercapnia: Secondary | ICD-10-CM | POA: Diagnosis not present

## 2021-01-25 LAB — GLUCOSE, CAPILLARY
Glucose-Capillary: 194 mg/dL — ABNORMAL HIGH (ref 70–99)
Glucose-Capillary: 151 mg/dL — ABNORMAL HIGH (ref 70–99)
Glucose-Capillary: 144 mg/dL — ABNORMAL HIGH (ref 70–99)
Glucose-Capillary: 95 mg/dL (ref 70–99)
Glucose-Capillary: 140 mg/dL — ABNORMAL HIGH (ref 70–99)
Glucose-Capillary: 121 mg/dL — ABNORMAL HIGH (ref 70–99)

## 2021-01-25 LAB — BASIC METABOLIC PANEL
Anion gap: 7 (ref 5–15)
BUN: 24 mg/dL — ABNORMAL HIGH (ref 8–23)
CO2: 25 mmol/L (ref 22–32)
Calcium: 8 mg/dL — ABNORMAL LOW (ref 8.9–10.3)
Chloride: 110 mmol/L (ref 98–111)
Creatinine, Ser: 0.93 mg/dL (ref 0.44–1.00)
GFR, Estimated: >60 mL/min (ref >60)
Glucose, Bld: 207 mg/dL — ABNORMAL HIGH (ref 70–99)
Potassium: 3.5 mmol/L (ref 3.5–5.1)
Sodium: 142 mmol/L (ref 135–145)

## 2021-01-25 LAB — PHOSPHORUS: Phosphorus: 3.3 mg/dL (ref 2.5–4.6)

## 2021-01-25 LAB — MAGNESIUM: Magnesium: 1.8 mg/dL (ref 1.7–2.4)

## 2021-01-25 MED ORDER — POTASSIUM CHLORIDE 20 MEQ PO PACK
40.0000 meq | PACK | Freq: Once | ORAL | Status: AC
  Administered 2021-01-25: 40 meq
  Filled 2021-01-25: qty 2

## 2021-01-25 MED ORDER — MAGNESIUM SULFATE 2 GM/50ML IV SOLN
2.0000 g | Freq: Once | INTRAVENOUS | Status: AC
  Administered 2021-01-25: 2 g via INTRAVENOUS

## 2021-01-25 NOTE — Progress Notes (Signed)
[facility] ADULT ICU REPLACEMENT PROTOCOL   The patient does apply for the [facility] Adult ICU Electrolyte Replacment Protocol based on the criteria listed below:   1.Exclusion criteria: TCTS patients, ECMO patients and Hypothermia Protocol, and   Dialysis patients 2. Is GFR >/= 30 ml/min? Yes.    Patient's GFR today is >60 3. Is SCr </= 2? Yes.   Patient's SCr is 0.93 mg/dL 4. Did SCr increase >/= 0.5 in 24 hours? No. 5.Pt's weight >40kg  Yes.   6. Abnormal electrolyte(s): K+3.5, Mag 1.8  7. Electrolytes replaced per protocol 8.  Call MD STAT for K+ </= 2.5, Phos </= 1, or Mag </= 1 Physician:  Dr. Larene Beach, Lilia Argue 01/25/2021 4:45 AM

## 2021-01-25 NOTE — Progress Notes (Addendum)
NAME:  [name], MRN:  [id number], DOB:  08/14/1950, LOS: 22 ADMISSION DATE:  01/03/2021, CONSULTATION DATE:  01/03/2021 REFERRING MD:  EDP - [facility] CHIEF COMPLAINT:  SOB, encephalopathy   History of Present Illness:  70 yo female presented to [facility] on 7/03 with dyspnea, and altered mental status.    Had admission from 12/21/20 to 12/30/20 for respiratory failure and CHF exacerbation.Transferred to [facility]. SpO2 65% and elevated PCO2.  Failed Bipap and required intubation. Course since then complicated by prolonged mechanical ventilation requiring tracheostomy 7/7  Pertinent  Medical History  Kyphoscoliosis, Restrictive lung disease, Chronic respiratory failure on 2 liters, DM type 2, Chronic a fib on eliquis, Dementia, Chronic combined CHF, Hypothyroidism, Seizures, CKD 3a, CAD s/p NSTEMI, HLD, GERD  Significant Hospital Events:   7/3  chronic HCRF from OHS and restrictive lung dz as well as deconditioning-->progression -->decompensated HFpEF and cor pulmonale-->worsening metabolic enceph from HCRF-->PNA --> acute on chronic resp failure. Admitted. Intubated. Ceftriaxone 7/3 x 1. Azith and Cefepime started.  7/4-7/5 Hypotension overnight. A line placed. LR bolus. Started on NE. RUE PICC placed 7/6 Hgb 6.7 (7.4), CT A/P completed to r/o RP bleed (negative), showed mild peripancreatic stranding. Lipase slightly elevated. 1U PRBCs ordered. 7/7 Tracheostomy placed, heparin gtt held for procedure. 7/8 Increased agitation with Propofol wean, increased HR, uptrending pressor requirements. Cortrak placed. Azith and cefepime stopped.  7/11 PEG tube placed, tolerating PSV 7/12 ATC trials started. Tubefeeds started/resumed 7/13 solucortef decreased to daily. Midodrine stopped. Heparin gtt stopped. Stated back on DOAC. PANDA removed 7/14: sat at side of bed w/ max assist for 15 minutes. Fatigued quickly. PT recommending LTAC setting 7/15: Tolerated trach collar yesterday 7/16: TCT x 2 hours 7/18 hgb 6.8  received 1 unit PRBC. LTACH appeal denied 7/22 Tolerating brief periods of trach collar and PMV, Periods of emesis >> TFs held, Bright red blood noted by RT  7/23 left atelectasis, bronchoscopy showing inspissated clot occluding distal trachea, needed cryo probe to remove clot down to left mainstem 7/20 4 repeat bronchoscopy with cryoprobe to remove residual clot left upper lobe and lower lobe, oozing of underlying mucosa, distal clot remains 7/23 atx due to clot. Trach replaced with ETT for purpose of bronchoscopy. Inspissated clot suctioned. No obvious proximal bleeding source identified.   Interim History / Subjective:   No events overnight. Tolerating CPAP this morning. Still with some minimal bloody ETT secretions.    Objective:  Blood pressure 103/76, pulse 100, temperature 98 F (36.7 C), temperature source Axillary, resp. rate 16, height 5\' 8"  (1.727 m), weight 104.1 kg, SpO2 100 %.    Vent Mode: CPAP;PSV FiO2 (%):  [40 %] 40 % Set Rate:  [22 bmp] 22 bmp Vt Set:  [510 mL] 510 mL PEEP:  [5 cmH20] 5 cmH20 Pressure Support:  [15 cmH20] 15 cmH20 Plateau Pressure:  [19 cmH20-24 cmH20] 19 cmH20   Intake/Output Summary (Last 24 hours) at 01/25/2021 1023 Last data filed at 01/25/2021 1000 Gross per 24 hour  Intake 2552.38 ml  Output 1650 ml  Net 902.38 ml    Physical Exam:  General:  frail elderly female on vent.  Neuro:  RASS -1.  HEENT:  [place]/AT, No JVD noted, PERRL Cardiovascular:  RRR, no MRG.  Lungs:  Rhonchi on the L, clear on the R. No distress. Pulling good volumes on SBT.  Abdomen:  Soft, non-distended Musculoskeletal:  No acute deformity. LUE 2+ edema. BLE trace edema.  Skin:  Intact, MMM  Chest x-ray  7/ 25 shows improved aeration on the L where there had previously been white-out.   Resolved Hospital Problem List   Septic shock from pneumonia Left upper extremity redness and swelling resolved Relative adrenal insufficiency -Weaned off steroids 7/15 Acute  metabolic encephalopathy 2nd to sepsis and hypercapnia - improved    Assessment & Plan:   Acute on chronic hypoxic/hypercapnic respiratory failure from acute pulmonary edema, pneumonia. Restrictive lung disease from kyphoscolosis Sleep disordered breathing (mix of OHS and OSA) Failure to wean from vent s/p tracheostomy on 01/07/21. -Endurance and fatigue are largest barriers for progress here. LTACH denied. Family is very determined to get her home.  7/23 left atelectasis >> inspissated clot, bronchoscopy with cryoprobe. Shiley #6 removed and orally intubated with 9.0 ETT for bronchoscopy and removal of large clot/foreign body P: SBT as tolerated. Weaning pressure support rather quickly here this morning.  Follow chest Xray.  Tracheobronchial toilet, tranexamic nebs and chest PT If she continues to progress on SBT, perhaps replace trach in a couple of days.   Hx of CVA, seizures, dementia, depression. P: Continue Keppra, Neurontin  Severe Physical deconditioning P: - PT/OT when able.  Chronic permanent atrial fibrillation Acute on chronic combined CHF -ECHO 45-50% with global hypokinesis  HLD -Cardiology evaluated during admission, now signed off P: - Amiodarone, statin, lopressor - Continue lasix 40mg  BID - Holding Eliquis due to hemoptysis.   DM type 2 poorly controlled with hyperglycemia -appears controlled P: Continue current regimen of 10 units Lantus and tube feed coverage 10 units 4 times daily  Anemia of critical illness and chronic disease. -No evidence of bleeding but hgb downtreded to 6.8 7/18 for which patient received 1 unit PRBC  P: Intermittently trend CBC Transfuse per protocol  Hgb  goal > 7  Hypothyroidism P: Continue synthryroid  Dysphagia post PEG placement Emesis x-ray abdomen 7/22 suggests possible ileus P: - Tube feeds progress to goal per RD recommendations   Best Practice    Diet/type: tubefeeds DVT prophylaxis: SCD GI prophylaxis:  PPI Lines: Central line and yes and it is still needed Has PICC RUE  Foley:  N/A Code Status:  full code Last date of multidisciplinary goals of care discussion: Patient and daughter updated at bedside.  Disposition: ICU  Critical care: Critical care time 37 minutes required for acute on chronic mixed respiratory failure requiring mechanical ventilation, hemoptysis, and severe physical deconditioning.     8/22, AGACNP-BC [place] [facility]  See Amion for personal pager PCCM on call pager [phone] until 7pm. Please call Elink 7p-7a. [phone]  01/25/2021 10:32 AM   Attending:   Subjective: Frequent bronchoscopies over the weekend to remove blood clot Now weaning on vent this month  Objective: Vitals:   01/25/21 0900 01/25/21 1000 01/25/21 1113 01/25/21 1200  BP:  103/76    Pulse: 100  75 76  Resp: 19 16 17  (!) 9  Temp:    100 F (37.8 C)  TempSrc:    Axillary  SpO2: 100% 100% 100% 100%  Weight:      Height:       Vent Mode: CPAP;PSV FiO2 (%):  [40 %] 40 % Set Rate:  [22 bmp] 22 bmp Vt Set:  [510 mL] 510 mL PEEP:  [5 cmH20] 5 cmH20 Pressure Support:  [12 cmH20-15 cmH20] 12 cmH20 Plateau Pressure:  [19 cmH20-24 cmH20] 19 cmH20  Intake/Output Summary (Last 24 hours) at 01/25/2021 1227 Last data filed at 01/25/2021 1200 Gross per 24 hour  Intake 3011.4 ml  Output 1650 ml  Net 1361.4 ml    General:  In bed on vent HENT: NCAT ETT in place PULM: Rhonchi bilaterally B, vent supported breathing CV: RRR, no mgr GI: BS+, soft, nontender MSK: normal bulk and tone Neuro: sedated on vent   CBC    Component Value Date/Time   WBC 10.6 (H) 01/23/2021 2353   RBC 2.83 (L) 01/23/2021 2353   HGB 8.3 (L) 01/23/2021 2353   HCT 26.4 (L) 01/23/2021 2353   PLT 226 01/23/2021 2353   MCV 93.3 01/23/2021 2353   MCH 29.3 01/23/2021 2353   MCHC 31.4 01/23/2021 2353   RDW 19.7 (H) 01/23/2021 2353   LYMPHSABS 1.2 12/21/2020 0953   MONOABS 0.7  12/21/2020 0953   EOSABS 0.2 12/21/2020 0953   BASOSABS 0.1 12/21/2020 0953    BMET    Component Value Date/Time   NA 142 01/25/2021 0313   K 3.5 01/25/2021 0313   CL 110 01/25/2021 0313   CO2 25 01/25/2021 0313   GLUCOSE 207 (H) 01/25/2021 0313   BUN 24 (H) 01/25/2021 0313   CREATININE 0.93 01/25/2021 0313   CREATININE 1.40 (H) 04/08/2020 0813   CALCIUM 8.0 (L) 01/25/2021 0313   CALCIUM 8.4 06/30/2013 0318   GFRNONAA >60 01/25/2021 0313   GFRNONAA 50 (L) 06/09/2017 1223   GFRAA 50 (L) 11/16/2019 1103   GFRAA 58 (L) 06/09/2017 1223    CXR images diffuse bilateral airspace disease, left lower lobe atelectasis  TTE: LVEF 45-50%, RVSP 60  Impression/Plan: Acute on chronic respiratory failure with hypoxemia: multi-factorial from acute pulmonary edema, restrictive lung disease, OSA, systolic heart failure; complicated by hemoptysis Continue pressure support ventilation today as long as tolerated Pulm toilette Plan tracheostomy again later this week Needs LTACH referral: sounds like we will need to discuss with health [facility] again  CVA, seizures, dementia: continue keppra, neurontin  Chronic systolic heart failure: continue amiodarone, statin, lopressors  Anemia of critical illness > transfuse for Hgb < 7  Global: overall prognosis guarded.  Suspect she will need a long term wean off the ventilator.  Consider palliative care.  My cc time 35 minutes  Heber [place], MD [place] PCCM Pager: [phone] Cell: [phone] After 7pm: [phone]

## 2021-01-25 NOTE — Progress Notes (Signed)
SLP Cancellation Note  Patient Details Name: [name] MRN: [id number] DOB: [birth year]   Cancelled treatment:       Reason Eval/Treat Not Completed: Patient not medically ready. Now orally intubated. Pt will need new tracheostomy eventually per notes. Will follow for readiness.    Reyansh Kushnir, Riley Nearing 01/25/2021, 9:30 AM

## 2021-01-26 ENCOUNTER — Encounter (HOSPITAL_COMMUNITY): Payer: Self-pay | Admitting: Internal Medicine

## 2021-01-26 ENCOUNTER — Inpatient Hospital Stay (HOSPITAL_COMMUNITY): Payer: PPO

## 2021-01-26 ENCOUNTER — Encounter (HOSPITAL_COMMUNITY)
Admission: EM | Disposition: A | Discharge status: Home Health | Payer: Self-pay | Source: Home / Self Care | Attending: Internal Medicine

## 2021-01-26 DIAGNOSIS — R0689 Other abnormalities of breathing: Secondary | ICD-10-CM

## 2021-01-26 DIAGNOSIS — J9602 Acute respiratory failure with hypercapnia: Secondary | ICD-10-CM | POA: Diagnosis not present

## 2021-01-26 DIAGNOSIS — R410 Disorientation, unspecified: Secondary | ICD-10-CM | POA: Diagnosis not present

## 2021-01-26 HISTORY — PX: VIDEO BRONCHOSCOPY: SHX5072

## 2021-01-26 HISTORY — PX: CRYOTHERAPY: SHX6894

## 2021-01-26 HISTORY — PX: HEMOSTASIS CONTROL: SHX6838

## 2021-01-26 LAB — CBC
HCT: 22.5 % — ABNORMAL LOW (ref 36.0–46.0)
Hemoglobin: 6.9 g/dL — CL (ref 12.0–15.0)
MCH: 28.9 pg (ref 26.0–34.0)
MCHC: 30.7 g/dL (ref 30.0–36.0)
MCV: 94.1 fL (ref 80.0–100.0)
Platelets: 172 10*3/uL (ref 150–400)
RBC: 2.39 MIL/uL — ABNORMAL LOW (ref 3.87–5.11)
RDW: 19.7 % — ABNORMAL HIGH (ref 11.5–15.5)
WBC: 9.4 10*3/uL (ref 4.0–10.5)
nRBC: 0.2 % (ref 0.0–0.2)

## 2021-01-26 LAB — BASIC METABOLIC PANEL
Anion gap: 6 (ref 5–15)
BUN: 28 mg/dL — ABNORMAL HIGH (ref 8–23)
CO2: 25 mmol/L (ref 22–32)
Calcium: 7.6 mg/dL — ABNORMAL LOW (ref 8.9–10.3)
Chloride: 108 mmol/L (ref 98–111)
Creatinine, Ser: 0.85 mg/dL (ref 0.44–1.00)
GFR, Estimated: >60 mL/min (ref >60)
Glucose, Bld: 105 mg/dL — ABNORMAL HIGH (ref 70–99)
Potassium: 3.8 mmol/L (ref 3.5–5.1)
Sodium: 139 mmol/L (ref 135–145)

## 2021-01-26 LAB — GLUCOSE, CAPILLARY
Glucose-Capillary: 101 mg/dL — ABNORMAL HIGH (ref 70–99)
Glucose-Capillary: 121 mg/dL — ABNORMAL HIGH (ref 70–99)
Glucose-Capillary: 119 mg/dL — ABNORMAL HIGH (ref 70–99)
Glucose-Capillary: 108 mg/dL — ABNORMAL HIGH (ref 70–99)
Glucose-Capillary: 111 mg/dL — ABNORMAL HIGH (ref 70–99)
Glucose-Capillary: 124 mg/dL — ABNORMAL HIGH (ref 70–99)

## 2021-01-26 LAB — MAGNESIUM: Magnesium: 2.1 mg/dL (ref 1.7–2.4)

## 2021-01-26 LAB — PREPARE RBC (CROSSMATCH)

## 2021-01-26 LAB — HEMOGLOBIN AND HEMATOCRIT, BLOOD
HCT: 27.6 % — ABNORMAL LOW (ref 36.0–46.0)
Hemoglobin: 8.3 g/dL — ABNORMAL LOW (ref 12.0–15.0)

## 2021-01-26 LAB — PHOSPHORUS: Phosphorus: 3.5 mg/dL (ref 2.5–4.6)

## 2021-01-26 SURGERY — VIDEO BRONCHOSCOPY WITHOUT FLUORO
Anesthesia: Moderate Sedation

## 2021-01-26 MED ORDER — EPINEPHRINE PF 1 MG/ML IJ SOLN
INTRAMUSCULAR | Status: DC | PRN
  Administered 2021-01-26: 3 mL

## 2021-01-26 MED ORDER — FENTANYL CITRATE (PF) 100 MCG/2ML IJ SOLN
100.0000 ug | Freq: Once | INTRAMUSCULAR | Status: AC
  Administered 2021-01-26: 100 ug via INTRAVENOUS

## 2021-01-26 MED ORDER — POTASSIUM CHLORIDE 20 MEQ PO PACK
40.0000 meq | PACK | Freq: Once | ORAL | Status: AC
  Administered 2021-01-26: 40 meq
  Filled 2021-01-26: qty 2

## 2021-01-26 MED ORDER — MIDAZOLAM HCL 2 MG/2ML IJ SOLN
INTRAMUSCULAR | Status: AC
  Filled 2021-01-26: qty 2

## 2021-01-26 MED ORDER — FENTANYL CITRATE (PF) 100 MCG/2ML IJ SOLN
INTRAMUSCULAR | Status: AC
  Filled 2021-01-26: qty 2

## 2021-01-26 MED ORDER — SODIUM CHLORIDE 0.9 % IV SOLN
2.0000 g | Freq: Three times a day (TID) | INTRAVENOUS | Status: AC
  Administered 2021-01-26 – 2021-01-31 (×14): 2 g via INTRAVENOUS
  Filled 2021-01-26 (×15): qty 2

## 2021-01-26 MED ORDER — FENTANYL CITRATE (PF) 100 MCG/2ML IJ SOLN: 50.0000 ug | Freq: Once | INTRAMUSCULAR | Status: DC

## 2021-01-26 MED ORDER — SODIUM CHLORIDE 0.9% IV SOLUTION: Freq: Once | INTRAVENOUS | Status: AC

## 2021-01-26 MED ORDER — MIDAZOLAM HCL 2 MG/2ML IJ SOLN
2.0000 mg | Freq: Once | INTRAMUSCULAR | Status: AC
  Administered 2021-01-26: 2 mg via INTRAVENOUS

## 2021-01-26 MED ORDER — TRANEXAMIC ACID FOR INHALATION
500.0000 mg | Freq: Three times a day (TID) | RESPIRATORY_TRACT | Status: DC
  Administered 2021-01-26 – 2021-01-28 (×5): 500 mg via RESPIRATORY_TRACT
  Filled 2021-01-26 (×6): qty 10

## 2021-01-26 NOTE — Procedures (Signed)
PCCM Video Bronchoscopy Procedure Note  The patient was informed of the risks (including but not limited to bleeding, infection, respiratory failure, lung injury, tooth/oral injury) and benefits of the procedure and gave consent, see chart.  Indication: Large blood clot completely occluding airways in left lower lobe  Post Procedure Diagnosis: same, blood clot removed with cryo-probe  Location: [facility]  Condition pre procedure: critically ill, on vent  Medications for procedure: propofol infusion, versed 2mg  IV, fentanyl IV  Procedure description: The bronchoscope was introduced through the endotracheal tube and passed to the bilateral lungs to the level of the subsegmental bronchi throughout the tracheobronchial tree.  Airway exam revealed normal tracheobronchial tree on the right.  There was a large clot in the left lower lobe as well as friable mucosa throughout the left tracheobronchial tree.      Procedures performed: With over 35 minutes of repeated effort the clot in the left lower lobe was removed piece by piece with the cryoprobe to the point that the underlying mucosa was visible.  There was no underlying mass or tumor.  The mucosa was very friable and oozing.  The majority of the bleeding appears to come from the superior segment of the left lower lobe.  Epinephrine was instilled in the airway for hemostasis.  Specimens sent: none  Condition post procedure: critically ill, on vent  EBL: < 20 cc related to underlying mucosal bleeding  Complications: none immediate  , MD [place] PCCM Pager: [phone] Cell: [phone] After 7:00 pm call Elink  [phone]

## 2021-01-26 NOTE — Progress Notes (Signed)
PT Cancellation Note  Patient Details Name: [name] MRN: [id number] DOB: [birth year]   Cancelled Treatment:    Reason Eval/Treat Not Completed: Patient at procedure or test/unavailable (unsure of procedure, multiple staff members in room)   Jerolyn Center, PT Pager [phone]    Zena Amos 01/26/2021, 1:40 PM

## 2021-01-26 NOTE — Progress Notes (Signed)
eLink Physician-Brief Progress Note Patient Name: CHRISTYN GUTKOWSKI DOB: May 06, 1951 MRN: [id number]   Date of Service  01/26/2021  HPI/Events of Note  Hgb 6.9. Last checked 7/23 when it was 8.3. Compatible with clinical picture.   eICU Interventions  Ordered T&C and 1u pRBC for transfusion.     Intervention Category Intermediate Interventions: Other:  Janae Bridgeman 01/26/2021, 5:16 AM

## 2021-01-26 NOTE — Procedures (Signed)
PCCM Video Bronchoscopy Procedure Note  The patient was informed of the risks (including but not limited to bleeding, infection, respiratory failure, lung injury, tooth/oral injury) and benefits of the procedure and gave consent, see chart.  Indication: left lung white out  Post Procedure Diagnosis: blood clot obstructing left lower lobe lateral basal and superior segments  Location: [facility] medical intensive care unit  Condition pre procedure: critically ill, on vent  Medications for procedure: propofol infusion, fentanyl injection  Procedure description: The bronchoscope was introduced through the endotracheal tube and passed to the bilateral lungs to the level of the subsegmental bronchi throughout the tracheobronchial tree.  Airway exam revealed normal appearing airways in the right tracheobronchial tree, sharp carina, normal trachea.  The left mainstem was patent, the left upper lobe was partially occluded by an old clot.  The superior segment and lateral basal segments were completely occluded by clot.  This could not be removed by suctioning.  Procedures performed: none  Specimens sent: none  Condition post procedure: none  EBL: none  Complications: none  Plan: repeat bronchoscopy with cryoprobe  Heber Ransom, MD [place] PCCM Pager: [phone] Cell: [phone] After 7:00 pm call Elink  [phone]

## 2021-01-26 NOTE — Progress Notes (Signed)
PT Cancellation Note  Patient Details Name: [name] MRN: [id number] DOB: 10-17-1950   Cancelled Treatment:    Reason Eval/Treat Not Completed: Patient's level of consciousness  Patient is about to have bronch and has been sedated for procedure. Will attempt later today as schedule permits and pt level of arousal permits.    Jerolyn Center, PT Pager [phone]   Zena Amos 01/26/2021, 9:03 AM

## 2021-01-26 NOTE — Progress Notes (Signed)
NAME:  [name], MRN:  [id number], DOB:  1951-04-20, LOS: 23 ADMISSION DATE:  01/03/2021, CONSULTATION DATE:  01/03/2021 REFERRING MD:  EDP - [facility] CHIEF COMPLAINT:  SOB, encephalopathy   History of Present Illness:  70 yo female presented to [facility] on 7/03 with dyspnea, and altered mental status.    Had admission from 12/21/20 to 12/30/20 for respiratory failure and CHF exacerbation.Transferred to [facility]. SpO2 65% and elevated PCO2.  Failed Bipap and required intubation. Course since then complicated by prolonged mechanical ventilation requiring tracheostomy 7/7  Pertinent  Medical History  Kyphoscoliosis, Restrictive lung disease, Chronic respiratory failure on 2 liters, DM type 2, Chronic a fib on eliquis, Dementia, Chronic combined CHF, Hypothyroidism, Seizures, CKD 3a, CAD s/p NSTEMI, HLD, GERD  Significant Hospital Events:   7/3  chronic HCRF from OHS and restrictive lung dz as well as deconditioning-->progression -->decompensated HFpEF and cor pulmonale-->worsening metabolic enceph from HCRF-->PNA --> acute on chronic resp failure. Admitted. Intubated. Ceftriaxone 7/3 x 1. Azith and Cefepime started.  7/4-7/5 Hypotension overnight. A line placed. LR bolus. Started on NE. RUE PICC placed 7/6 Hgb 6.7 (7.4), CT A/P completed to r/o RP bleed (negative), showed mild peripancreatic stranding. Lipase slightly elevated. 1U PRBCs ordered. 7/7 Tracheostomy placed, heparin gtt held for procedure. 7/8 Increased agitation with Propofol wean, increased HR, uptrending pressor requirements. Cortrak placed. Azith and cefepime stopped.  7/11 PEG tube placed, tolerating PSV 7/12 ATC trials started. Tubefeeds started/resumed 7/13 solucortef decreased to daily. Midodrine stopped. Heparin gtt stopped. Stated back on DOAC. PANDA removed 7/14: sat at side of bed w/ max assist for 15 minutes. Fatigued quickly. PT recommending LTAC setting 7/15: Tolerated trach collar yesterday 7/16: TCT x 2 hours 7/18 hgb 6.8  received 1 unit PRBC. LTACH appeal denied 7/22 Tolerating brief periods of trach collar and PMV, Periods of emesis >> TFs held, Bright red blood noted by RT  7/23 left atelectasis, bronchoscopy showing inspissated clot occluding distal trachea, needed cryo probe to remove clot down to left mainstem 7/20 4 repeat bronchoscopy with cryoprobe to remove residual clot left upper lobe and lower lobe, oozing of underlying mucosa, distal clot remains 7/23 atx due to clot. Trach replaced with ETT for purpose of bronchoscopy. Inspissated clot suctioned. No obvious proximal bleeding source identified. Required TXA nebs and Cryoprobe 7/25 still w/ bloody trach secretions. Tolerating CPAP  7/26 hgb down 6.9 1 u PRBC transfused   Interim History / Subjective:  She is awake this morning.  Tolerating pressure support however her chest x-ray demonstrates near complete opacification once again of the left hemithorax so weaning efforts were discontinued  Objective:  Blood pressure 104/74, pulse 89, temperature 98.9 F (37.2 C), temperature source Oral, resp. rate 13, height 5\' 8"  (1.727 m), weight 103.1 kg, SpO2 99 %.    Vent Mode: PRVC FiO2 (%):  [40 %] 40 % Set Rate:  [22 bmp] 22 bmp Vt Set:  [510 mL] 510 mL PEEP:  [5 cmH20] 5 cmH20 Pressure Support:  [12 cmH20-15 cmH20] 12 cmH20 Plateau Pressure:  [20 cmH20-21 cmH20] 21 cmH20   Intake/Output Summary (Last 24 hours) at 01/26/2021 0734 Last data filed at 01/26/2021 01/28/2021 Gross per 24 hour  Intake 1411.36 ml  Output 1200 ml  Net 211.36 ml   Physical Exam:   General: This is a 70 year old female patient who remains on the mechanical ventilator. HEENT: Orally intubated with a size 9 endotracheal tube mucous membranes are moist sclera are nonicteric no clear jugular venous  distention.  The old trach site dressing is intact Pulmonary: Coarse scattered bilateral rhonchi she is more diminished on the left Cardiac regular irregular atrial fibrillation noted on  monitor, currently rate controlled Abdomen: Soft nontender PEG tube is in place she is tolerating bolus feeds bowel sounds are present GU: External urinary drainage device in place clear yellow urine noted Extremities warm dry dependent edema right upper extremity PICC noted, dressing is intact Neuro: Awake, cooperative, follows commands, no clear motor deficits.  Unable to truly assess orientation but much more interactive than prior exams Resolved Hospital Problem List   Septic shock from pneumonia Left upper extremity redness and swelling resolved Relative adrenal insufficiency -Weaned off steroids 7/15 Acute metabolic encephalopathy 2nd to sepsis and hypercapnia - improved  Hemoptysis w/ resultant atelectasis/lung collapse. Required trach removal and oral intubation 7/23 to facilitate bronchoscopy and cryoprobe. Ileus (7/22)   Assessment & Plan:   Acute on chronic hypoxic/hypercapnic respiratory failure from acute pulmonary edema, pneumonia. Restrictive lung disease from kyphoscolosis Sleep disordered breathing (mix of OHS and OSA) Failure to wean  Hemoptysis with resultant lung collapse Portable chest x-ray personally reviewed this morning: Demonstrates endotracheal tube in satisfactory position, the left hemithorax is once again completely opacified Plan Placed back on full ventilator support We will proceed with therapeutic bronchoscopy later this morning Continue PAD protocol RASS goal -1 Continue pulmonary toilet, currently on scheduled Mucomyst, chest physiotherapy, and receiving as needed bronchodilators. VAP bundle Chest x-ray in morning Eventually will need tracheostomy redo, need to get pulmonary status stabilized more first  Low grade temps T-max 100 Fahrenheit Plan Continue to trend CBC We will send sputum culture during bronchoscopy, sometimes infection can be reason for hemoptysis  Hx of CVA, seizures, dementia, depression. Plan Cont keppra and neurontin     Severe Physical deconditioning Plan OOB Re-engage PT after trach   Chronic permanent atrial fibrillation Acute on chronic combined CHF -ECHO 45-50% with global hypokinesis  HLD -Cardiology evaluated during admission, now signed off Plan Cont amiodarone, statin and lopressor Cont lasix BID as BUN/cr and BP allow Holding Eliquis d/t recent life threatening hemoptysis and also need for trach re-do in near future   DM type 2 poorly controlled with hyperglycemia Plan Cont current basal and ssi coverage   Anemia of critical illness and chronic disease. Hgb trending down again but seems like hemoptysis better. Blood ordered for today  Plan Complete transfusion and repeat CBC in am Hgb goal >7 Holding [facility]  Hypothyroidism Plan Synthroid   Dysphagia post PEG placement plan Progress tube feeds per goal    Best Practice    Diet/type: tubefeeds DVT prophylaxis: SCD GI prophylaxis: PPI Lines: Central line and yes and it is still needed Has PICC RUE  Foley:  N/A Code Status:  full code Last date of multidisciplinary goals of care discussion: Patient and daughter updated at bedside.  Disposition: ICU  My critical care x32 minutes Simonne Martinet ACNP-BC [facility] Pulmonary/Critical Care Pager # [phone] OR # [phone] if no answer

## 2021-01-26 NOTE — Progress Notes (Signed)
Pharmacy Antibiotic Note  [name] is a 70 y.o. female with VDRF and s/p bronchoscopy and concern of HCAP  Pharmacy has been consulted for cefepime dosing. =WBC= 9.4, afebrile, SCr ~ 0.8  Plan: -Cefepime 2gm IV q8h -Will follow renal function, cultures and clinical progress     Height: 5\' 8"  (172.7 cm) Weight: 103.1 kg (227 lb 4.7 oz) IBW/kg (Calculated) : 63.9  Temp (24hrs), Avg:98.9 F (37.2 C), Min:97.7 F (36.5 C), Max:100 F (37.8 C)  Recent Labs  Lab 01/22/21 0533 01/22/21 1328 01/23/21 0622 01/23/21 2353 01/24/21 0340 01/25/21 0313 01/26/21 0339  WBC 8.0 11.5* 12.6* 10.6*  --   --  9.4  CREATININE 0.80  --  0.87  --  0.89 0.93 0.85    Estimated Creatinine Clearance: 76.3 mL/min (by C-G formula based on SCr of 0.85 mg/dL).    Allergies  Allergen Reactions   Citalopram Hydrobromide Other (See Comments)    Dyskinesia Other reaction(s): Other (See Comments) Dyskinesia   Codeine Nausea And Vomiting and Other (See Comments)    HALLUCINATIONS Other reaction(s): Unknown Other reaction(s): Other (See Comments) HALLUCINATIONS    Hydromorphone Hcl Other (See Comments)    Made her pass out Other reaction(s): Other (See Comments) Made her pass out   Metoclopramide Other (See Comments)    DYSKINESIA Other reaction(s): Other (See Comments) DYSKINESIA   Hyoscyamine Other (See Comments)    MADE DIARRHEA WORSE      Thank you for allowing pharmacy to be a part of this patient's care.  01/28/21, PharmD Clinical Pharmacist **Pharmacist phone directory can now be found on [email or web address] ([email or web address]).  Listed under [facility].

## 2021-01-26 NOTE — Progress Notes (Signed)
Patient ID: [name], female   DOB: 18-May-1951, 70 y.o.   MRN: [id number]    Progress Note from the Palliative Medicine Team at [facility]   Patient Name: [name]        Date: 01/26/2021 DOB: 1951/05/25  Age: 70 y.o. MRN#: [id number] Attending Physician: Lupita Leash, MD Primary Care Physician: Gareth Morgan, MD Admit Date: 01/03/2021   Medical records reviewed   70 yo female presented to [facility] on 7/03 with dyspnea, and altered mental status.    Had admission from 12/21/20 to 12/30/20 for respiratory failure and CHF exacerbation.Transferred to [facility]. SpO2 65% and elevated PCO2.  Failed Bipap and required intubation. Course since then complicated by prolonged mechanical ventilation requiring tracheostomy 7/7  PMH significant for Kyphoscoliosis, Restrictive lung disease, Chronic respiratory failure on 2 liters, DM type 2, Chronic a fib on eliquis, Dementia, Chronic combined CHF, Hypothyroidism, Seizures, CKD 3a, CAD s/p NSTEMI, HLD, GERD  Significant Hospital Events:    7/3  chronic HCRF from OHS and restrictive lung dz as well as deconditioning-->progression -->decompensated HFpEF and cor pulmonale-->worsening metabolic enceph from HCRF-->PNA --> acute on chronic resp failure. Admitted. Intubated. Ceftriaxone 7/3 x 1. Azith and Cefepime started. 7/4-7/5 Hypotension overnight. A line placed. LR bolus. Started on NE. RUE PICC placed 7/6 Hgb 6.7 (7.4), CT A/P completed to r/o RP bleed (negative), showed mild peripancreatic stranding. Lipase slightly elevated. 1U PRBCs ordered. 7/7 Tracheostomy placed, heparin gtt held for procedure. 7/8 Increased agitation with Propofol wean, increased HR, uptrending pressor requirements. Cortrak placed. Azith and cefepime stopped. 7/11 PEG tube placed, tolerating PSV 7/12 ATC trials started. Tubefeeds started/resumed 7/13 solucortef decreased to daily. Midodrine stopped. Heparin gtt stopped. Stated back on DOAC. PANDA removed 7/14: sat at  side of bed w/ max assist for 15 minutes. Fatigued quickly. PT recommending LTAC setting 7/15: Tolerated trach collar yesterday 7/16: TCT x 2 hours 7/18 hgb 6.8 received 1 unit PRBC. LTACH appeal denied 7/22 Tolerating brief periods of trach collar and PMV, Periods of emesis >> TFs held, Bright red blood noted by RT 7/23 left atelectasis, bronchoscopy showing inspissated clot occluding distal trachea, needed cryo probe to remove clot down to left mainstem 7/20 4 repeat bronchoscopy with cryoprobe to remove residual clot left upper lobe and lower lobe, oozing of underlying mucosa, distal clot remains 7/23 atx due to clot. Trach replaced with ETT for purpose of bronchoscopy. Inspissated clot suctioned. No obvious proximal bleeding source identified. Required TXA nebs and Cryoprobe 7/25 still w/ bloody trach secretions. Tolerating CPAP 7/26 hgb down 6.9 1 u PRBC transfused      This NP discussed case with Anders Simmonds NP CCM    Family is very clear, they are open to all offered and available medical interventions to prolong life.    CCM will continue to address GOCs through out this hospitalization and re-consult PMT if indicated.  PMT will sign off at this time.  No charge   Lorinda Creed NP  Palliative Medicine Team Team Phone # [phone] Pager [phone]

## 2021-01-26 NOTE — Progress Notes (Signed)
RT assisted MD with bedside bronchoscopy. No complications. RT will continue to monitor.

## 2021-01-27 ENCOUNTER — Inpatient Hospital Stay (HOSPITAL_COMMUNITY): Payer: PPO

## 2021-01-27 ENCOUNTER — Encounter (HOSPITAL_COMMUNITY): Payer: Self-pay | Admitting: Internal Medicine

## 2021-01-27 DIAGNOSIS — J9601 Acute respiratory failure with hypoxia: Secondary | ICD-10-CM | POA: Diagnosis not present

## 2021-01-27 DIAGNOSIS — R0689 Other abnormalities of breathing: Secondary | ICD-10-CM | POA: Diagnosis not present

## 2021-01-27 DIAGNOSIS — J9602 Acute respiratory failure with hypercapnia: Secondary | ICD-10-CM | POA: Diagnosis not present

## 2021-01-27 DIAGNOSIS — R410 Disorientation, unspecified: Secondary | ICD-10-CM | POA: Diagnosis not present

## 2021-01-27 DIAGNOSIS — R042 Hemoptysis: Secondary | ICD-10-CM

## 2021-01-27 DIAGNOSIS — G9340 Encephalopathy, unspecified: Secondary | ICD-10-CM | POA: Diagnosis not present

## 2021-01-27 DIAGNOSIS — J9621 Acute and chronic respiratory failure with hypoxia: Secondary | ICD-10-CM | POA: Diagnosis not present

## 2021-01-27 LAB — TRIGLYCERIDES: Triglycerides: 105 mg/dL (ref <150)

## 2021-01-27 LAB — CBC
HCT: 26 % — ABNORMAL LOW (ref 36.0–46.0)
Hemoglobin: 8 g/dL — ABNORMAL LOW (ref 12.0–15.0)
MCH: 29 pg (ref 26.0–34.0)
MCHC: 30.8 g/dL (ref 30.0–36.0)
MCV: 94.2 fL (ref 80.0–100.0)
Platelets: 172 10*3/uL (ref 150–400)
RBC: 2.76 MIL/uL — ABNORMAL LOW (ref 3.87–5.11)
RDW: 18.9 % — ABNORMAL HIGH (ref 11.5–15.5)
WBC: 12.4 10*3/uL — ABNORMAL HIGH (ref 4.0–10.5)
nRBC: 0 % (ref 0.0–0.2)

## 2021-01-27 LAB — GLUCOSE, CAPILLARY
Glucose-Capillary: 107 mg/dL — ABNORMAL HIGH (ref 70–99)
Glucose-Capillary: 119 mg/dL — ABNORMAL HIGH (ref 70–99)
Glucose-Capillary: 126 mg/dL — ABNORMAL HIGH (ref 70–99)
Glucose-Capillary: 149 mg/dL — ABNORMAL HIGH (ref 70–99)
Glucose-Capillary: 96 mg/dL (ref 70–99)

## 2021-01-27 LAB — BPAM RBC
Blood Product Expiration Date: 202208212359
ISSUE DATE / TIME: 202207260625
Unit Type and Rh: 6200

## 2021-01-27 LAB — TYPE AND SCREEN
ABO/RH(D): A POS
Antibody Screen: NEGATIVE
Unit division: 0

## 2021-01-27 MED ORDER — VECURONIUM BROMIDE 10 MG IV SOLR
10.0000 mg | Freq: Once | INTRAVENOUS | Status: AC
  Administered 2021-01-27: 10 mg via INTRAVENOUS
  Filled 2021-01-27: qty 10

## 2021-01-27 MED ORDER — PROPOFOL 10 MG/ML IV BOLUS
500.0000 mg | Freq: Once | INTRAVENOUS | Status: DC
  Filled 2021-01-27: qty 60

## 2021-01-27 MED ORDER — FENTANYL CITRATE (PF) 100 MCG/2ML IJ SOLN
200.0000 ug | Freq: Once | INTRAMUSCULAR | Status: AC
  Administered 2021-01-27: 100 ug via INTRAVENOUS
  Filled 2021-01-27: qty 4

## 2021-01-27 MED ORDER — ETOMIDATE 2 MG/ML IV SOLN
40.0000 mg | Freq: Once | INTRAVENOUS | Status: AC
  Administered 2021-01-27: 20 mg via INTRAVENOUS
  Filled 2021-01-27: qty 20

## 2021-01-27 MED ORDER — FUROSEMIDE 10 MG/ML IJ SOLN
20.0000 mg | Freq: Two times a day (BID) | INTRAMUSCULAR | Status: DC
  Administered 2021-01-27 – 2021-02-04 (×18): 20 mg via INTRAVENOUS
  Filled 2021-01-27 (×17): qty 2

## 2021-01-27 MED ORDER — MIDAZOLAM HCL 2 MG/2ML IJ SOLN
5.0000 mg | Freq: Once | INTRAMUSCULAR | Status: AC
  Administered 2021-01-27: 4 mg via INTRAVENOUS
  Filled 2021-01-27: qty 6

## 2021-01-27 NOTE — Procedures (Signed)
Diagnostic Bronchoscopy Procedure Note   [name]  [id number]  [birth year]  Date:01/27/21  Time:11:59 AM   Provider Performing:[name]  Procedure: Diagnostic Bronchoscopy (56256)  Indication(s) Assist with direct visualization of tracheostomy placement  Consent Risks of the procedure as well as the alternatives and risks of each were explained to the patient and/or caregiver.  Consent for the procedure was obtained.   Anesthesia See separate tracheostomy note   Time Out Verified patient identification, verified procedure, site/side was marked, verified correct patient position, special equipment/implants available, medications/allergies/relevant history reviewed, required imaging and test results available.   Sterile Technique Usual hand hygiene, masks, gowns, and gloves were used   Procedure Description Bronchoscope advanced through endotracheal tube and into airway.  After suctioning out tracheal secretions, bronchoscope used to provide direct visualization of tracheostomy placement.   Complications/Tolerance None; patient tolerated the procedure well. .   EBL None   Specimen(s) None  Simonne Martinet ACNP-BC [facility] Pulmonary/Critical Care Pager # [phone] OR # [phone] if no answer

## 2021-01-27 NOTE — Progress Notes (Signed)
Nutrition Follow-up  DOCUMENTATION CODES:   Obesity unspecified  INTERVENTION:   Continue bolus TF via PEG: Dillard [place] Standard 1.4, 325 ml 4 times per day. Recommend bolus slowly over 15-20 minutes to promote tolerance. Prosource TF 45 ml TID  Provides 1940 kcal, 113 gm protein daily, 936 ml free water daily.  Continue free water flushes 200 ml every 6 hours for a total of 1736 ml free water daily.  NUTRITION DIAGNOSIS:   Inadequate oral intake related to inability to eat as evidenced by NPO status.  Ongoing   GOAL:   Provide needs based on ASPEN/SCCM guidelines  Met with TF  MONITOR:   Weight trends, TF tolerance, Labs, Vent status, Skin, I & O's  REASON FOR ASSESSMENT:   Consult Assessment of nutrition requirement/status, Enteral/tube feeding initiation and management  ASSESSMENT:   Pt with a PMH including T2DM, Afib, dementia, CHF, seizure disorder, CKD stage 3, CAD w/ prior NSTEMI, HTN, hypothyrodism, and dyslipidemia admitted with acute combined hypoxic and hypercapnic respiratory failure 2/2 CHF, HTN, restrictive lung disease 2/2 kyphosis. Note pt recently admitted from 6/20-6/29 for acute on chronic hypoxic hypercapnic respiratory failure 2/2 CHF exacerbations.  Discussed patient in ICU rounds and with RN today. Patient is following commands. Trach replaced with ETT 7/23 for bronchoscopy and clot removal. Trach being replaced today.   Bolus feedings via PEG initiated 7/12. Currently on hold for procedure. Patient has been tolerating bolus TF without difficulty.  Receiving free water flushes 200 ml every 6 hours.  Patient remains intubated on ventilator support MV: 6.5 L/min Temp (24hrs), Avg:99 F (37.2 C), Min:98.4 F (36.9 C), Max:99.7 F (37.6 C)  Propofol off  Labs reviewed.  CBG: 107-119  Medications reviewed and include lasix, novolog, lantus, protonix.  Admission weight 106.1 kg Current weight 104.7 kg Lowest weight since admission 101.9  kg on 7/24  I/O +8.8 L since admission UOP 1950 ml x 24 hours   Diet Order:   Diet Order             Diet NPO time specified  Diet effective midnight           Diet NPO time specified Except for: Sips with Meds  Diet effective midnight                   EDUCATION NEEDS:   Not appropriate for education at this time  Skin:  Skin Assessment: Reviewed RN Assessment Other: skin tear R arm  Last BM:  7/25 type 6  Height:   Ht Readings from Last 1 Encounters:  01/03/21 _0  (1.727 m)    Weight:   Wt Readings from Last 1 Encounters:  01/27/21 104.7 kg    Ideal Body Weight:  63.63 kg  BMI:  Body mass index is 35.1 kg/m.  Estimated Nutritional Needs:   Kcal:  1675-1900  Protein:  110-130 gm  Fluid:  >/= 1.8 L    Lucas Mallow, RD, LDN, CNSC Please refer to Amion for contact information.

## 2021-01-27 NOTE — Progress Notes (Signed)
NAME:  Tara BERTONI, MRN:  [id number], DOB:  Aug 25, 1950, LOS: 24 ADMISSION DATE:  01/03/2021, CONSULTATION DATE:  01/03/2021 REFERRING MD:  EDP - [facility] CHIEF COMPLAINT:  SOB, encephalopathy   History of Present Illness:  70 yo female presented to [facility] on 7/03 with dyspnea, and altered mental status.    Had admission from 12/21/20 to 12/30/20 for respiratory failure and CHF exacerbation.Transferred to [facility]. SpO2 65% and elevated PCO2.  Failed Bipap and required intubation. Course since then complicated by prolonged mechanical ventilation requiring tracheostomy 7/7  Pertinent  Medical History  Kyphoscoliosis, Restrictive lung disease, Chronic respiratory failure on 2 liters, DM type 2, Chronic a fib on eliquis, Dementia, Chronic combined CHF, Hypothyroidism, Seizures, CKD 3a, CAD s/p NSTEMI, HLD, GERD  Significant Hospital Events:   7/3  chronic HCRF from OHS and restrictive lung dz as well as deconditioning-->progression -->decompensated HFpEF and cor pulmonale-->worsening metabolic enceph from HCRF-->PNA --> acute on chronic resp failure. Admitted. Intubated. Ceftriaxone 7/3 x 1. Azith and Cefepime started.  7/4-7/5 Hypotension overnight. A line placed. LR bolus. Started on NE. RUE PICC placed 7/6 Hgb 6.7 (7.4), CT A/P completed to r/o RP bleed (negative), showed mild peripancreatic stranding. Lipase slightly elevated. 1U PRBCs ordered. 7/7 Tracheostomy placed, heparin gtt held for procedure. 7/8 Increased agitation with Propofol wean, increased HR, uptrending pressor requirements. Cortrak placed. Azith and cefepime stopped.  7/11 PEG tube placed, tolerating PSV 7/12 ATC trials started. Tubefeeds started/resumed 7/13 solucortef decreased to daily. Midodrine stopped. Heparin gtt stopped. Stated back on DOAC. PANDA removed 7/14: sat at side of bed w/ max assist for 15 minutes. Fatigued quickly. PT recommending LTAC setting 7/15: Tolerated trach collar yesterday 7/16: TCT x 2 hours 7/18 hgb 6.8  received 1 unit PRBC. LTACH appeal denied 7/22 Tolerating brief periods of trach collar and PMV, Periods of emesis >> TFs held, Bright red blood noted by RT  7/23 left atelectasis, bronchoscopy showing inspissated clot occluding distal trachea, needed cryo probe to remove clot down to left mainstem 7/20 4 repeat bronchoscopy with cryoprobe to remove residual clot left upper lobe and lower lobe, oozing of underlying mucosa, distal clot remains 7/23 atx due to clot. Trach replaced with ETT for purpose of bronchoscopy. Inspissated clot suctioned. No obvious proximal bleeding source identified. Required TXA nebs and Cryoprobe 7/25 still w/ bloody trach secretions. Tolerating CPAP  7/26 hgb down 6.9 1 u PRBC transfused; complete white out of left hemithorax (again). Underwent bronch w/ cryoprobe for removal of clots. large clot in the left lower lobe as well as friable mucosa throughout the left tracheobronchial tree. Low grade temp. Concern for possible VAP/HCAP so sputum sent and Cefepime started.  7/27: Trach placement  Interim History / Subjective:  No distress this morning.  Awaiting tracheostomy.  Objective:  Blood pressure 121/63, pulse 80, temperature 98.7 F (37.1 C), temperature source Oral, resp. rate 20, height 5\' 8"  (1.727 m), weight 104.7 kg, SpO2 98 %.    Vent Mode: PRVC FiO2 (%):  [40 %] 40 % Set Rate:  [22 bmp] 22 bmp Vt Set:  [510 mL] 510 mL PEEP:  [5 cmH20] 5 cmH20 Pressure Support:  [10 cmH20] 10 cmH20 Plateau Pressure:  [14 cmH20-23 cmH20] 14 cmH20   Intake/Output Summary (Last 24 hours) at 01/27/2021 01/29/2021 Last data filed at 01/27/2021 0600 Gross per 24 hour  Intake 2230.25 ml  Output 1950 ml  Net 280.25 ml   Physical Exam:  General: 70 year old female patient she is lying in  bed currently in no acute distress HEENT normocephalic atraumatic orally intubated prior tracheostomy dressing is intact Pulmonary: Coarse scattered rhonchi no accessory use Cardiac: Regular rate  and rhythm Abdomen: Soft and nontender feeding tube intact tolerating her bolus feeds Extremities: Dependent edema brisk capillary refill Neuro: Interactive moves all extremities no focal deficits GU: Clear yellow via external drainage device  Resolved Hospital Problem List   Septic shock from pneumonia Left upper extremity redness and swelling resolved Relative adrenal insufficiency -Weaned off steroids 7/15 Acute metabolic encephalopathy 2nd to sepsis and hypercapnia - improved  Hemoptysis w/ resultant atelectasis/lung collapse. Required trach removal and oral intubation 7/23 to facilitate bronchoscopy and cryoprobe. Ileus (7/22)   Assessment & Plan:   Acute on chronic hypoxic/hypercapnic respiratory failure from acute pulmonary edema, pneumonia. Restrictive lung disease from kyphoscolosis Sleep disordered breathing (mix of OHS and OSA) Failure to wean  Hemoptysis with resultant lung collapse s/p cryoprobe again 7/26 Plan Cont full support PAD protocol  RASS goal -1 Pulm toilet  Cont BDs, will stop mucomyst and replace w/ HT NaCl neb VAP bundle Abx per below Trach re-do today. Will require re-look w/ bronch   Low grade temps w/ rising leukocytosis/SIRS (7/27) In context of hemoptysis concern about pulmonary infection/HCAP ->respiratory culture sent 7/26 Plan Day 2 cefepime Cultures pending; await results; narrow or widen as indicated  Hx of CVA, seizures, dementia, depression. Plan Keppra and Neurontin    Severe Physical deconditioning Plan Will get PT back involved after trach  Chronic permanent atrial fibrillation Acute on chronic combined CHF -ECHO 45-50% with global hypokinesis  HLD -Cardiology evaluated during admission, now signed off Plan Cont current amiodarone, lopressor and statin Cont lasix BID repeating chem in am  No Eliquis for now given recurrent issue w/ hemoptysis -->plus plan for trach re-do    DM type 2 poorly controlled with  hyperglycemia Plan No change in current ssi and basal coverage    Anemia of critical illness and chronic disease. Got blood 7/26  Plan Am cbc Transfuse for hgb < 7   Hypothyroidism Plan Synthroid   Dysphagia post PEG placement plan Cont bolus feeds   Best Practice    Diet/type: tubefeeds DVT prophylaxis: SCD GI prophylaxis: PPI Lines: Central line and yes and it is still needed Has PICC RUE  Foley:  N/A Code Status:  full code Last date of multidisciplinary goals of care discussion: Patient and daughter updated at bedside.  Disposition: ICU  My critical care x31 min Simonne Martinet ACNP-BC [facility] Pulmonary/Critical Care Pager # [phone] OR # [phone] if no answer

## 2021-01-27 NOTE — TOC Progression Note (Addendum)
Transition of Care [facility]) - Progression Note    Patient Details  Name: [name] MRN: [id number] Date of Birth: [birth year]  Transition of Care [facility]) CM/SW Contact  Epifanio Lesches, RN Phone Number: 01/27/2021, 1:47 PM  Clinical Narrative:     NCM spoke with pt's daughter [name] regarding d/c planning. [name] states with LTAC denial pt will d/c to home only. Declined SNF placement. Agreeable to home health services. Daughter states already has hospital bed , home oxygen.   TOC team following and will assist with  TOC needs....  01/28/2021 @ 1130 Referral made with [employer] for DME : home ventilator.   01/29/2021 1530  Referral made with Rob/ [facility] for home health RN services. Clinicals faxed to [phone], acceptance pending....  Ian Malkin with [employer] informed NCM home assessment regarding home ventilator completed and passed, family ready for training. DME hospital bed requested by daughter. Order placed, referral made with [employer].  02/01/2021 @ 9:53 am NCM spoke with Rob/ [facility] [phone]). Rob informed NCM benefit check in process for private duty nursing. Once determination received if pt has benefits, insurance authorization will be initiated by [place].  02/01/2021 @ 11:15 am Rob informed NCM pt without benefits for Adult Private Duty Nursing.   TOC team following and will continue to assist with TOC  needs.....      Expected Discharge Plan: Home w Home Health Services Barriers to Discharge: Continued Medical Work up  Expected Discharge Plan and Services Expected Discharge Plan: Home w Home Health Services                                               Social Determinants of Health (SDOH) Interventions    Readmission Risk Interventions Readmission Risk Prevention Plan 12/28/2020 11/24/2020 09/21/2020  Transportation Screening Complete Complete Complete  HRI or Home Care Consult - Complete -  Social Work  Consult for Recovery Care Planning/Counseling - Complete -  Palliative Care Screening - Not Applicable -  Medication Review Oceanographer) Complete Complete Complete  PCP or Specialist appointment within 3-5 days of discharge - - Complete  HRI or Home Care Consult Complete - Complete  SW Recovery Care/Counseling Consult Complete - Complete  Palliative Care Screening (No Data) - Complete  Skilled Nursing Facility Patient Refused - Complete  Some recent data might be hidden

## 2021-01-27 NOTE — Progress Notes (Signed)
Physical Therapy Treatment Patient Details Name: [name] MRN: [id number] DOB: 18-Oct-1950 Today's Date: 01/27/2021    History of Present Illness [name] is a 70 y.o. female with medical history significant for obesity, type 2 diabetes, chronic atrial fibrillation on Eliquis, dementia, diastolic CHF with chronic hypoxemia on 2 L nasal cannula, hypothyroidism, seizure disorder, CKD stage III, CAD with prior NSTEMI, dyslipidemia, and GERD who presented to the ED with worsening shortness of breath over the past couple days.  Daughter at bedside states that she has had approximately 20 pound weight gain noted as well over the same timeframe.  Her symptoms were worsening and EMS was called and upon arrival she was noted to have pulse oximetry of 55%.  She was noted to be taking her medications normally according to the daughter and was wearing her home oxygen.  She denied any chest pain, cough, fevers, or chills.  Some mild lower extremity edema has been noted as well.  No other sick contacts and she has had her COVID vaccines and booster shots.  She appears to have been admitted just last month with acute on chronic diastolic CHF.  Her discharge weight at that time was 214 pounds.  She is currently at 227 pounds.    PT Comments    Patient seen for therapeutic exercise x 4 extremities (remains on low dose propofol). Pt required increased time and frequent arousing by calling her name to maintain attention to task.     Follow Up Recommendations  LTACH;Supervision/Assistance - 24 hour (insurance denied LTACH, however remains most appropriate venue; dtr wants to take pt home so would maximize home services)     Equipment Recommendations  Wheelchair (measurements PT);Wheelchair cushion (measurements PT);Hospital bed;Other (comment) (hoyer lift)    Recommendations for Other Services       Precautions / Restrictions Precautions Precautions: Fall Precaution Comments: vent with trach     Mobility  Bed Mobility                    Transfers                    Ambulation/Gait                 Stairs             Wheelchair Mobility    Modified Rankin (Stroke Patients Only)       Balance                                            Cognition Arousal/Alertness: Lethargic;Suspect due to medications (RN had ok'd PT, yet note pt on propofol) Behavior During Therapy: WFL for tasks assessed/performed Overall Cognitive Status: Difficult to assess                                 General Comments: Pt following 1 step commands with incr time, slow to process, intermittent closing of eyes      Exercises General Exercises - Upper Extremity Shoulder Flexion: Other (comment);PROM;Both;5 reps (1 rep R; fatigued on L and did not perform) Shoulder Extension: Strengthening;Both;5 reps Shoulder ABduction: PROM;Both;5 reps Elbow Flexion: AAROM;Both;5 reps Elbow Extension: Strengthening;Both;5 reps General Exercises - Lower Extremity Ankle Circles/Pumps: AROM;Both;5 reps Heel Slides: AAROM;Both;Supine;Strengthening;5 reps (assisted flexion; resisted extension) Hip ABduction/ADduction: PROM;Both;5  reps    General Comments General comments (skin integrity, edema, etc.): Pt on PS/CPAP. Daughter present throughout session      Pertinent Vitals/Pain Pain Assessment: Faces Faces Pain Scale: No hurt    Home Living                      Prior Function            PT Goals (current goals can now be found in the care plan section) Acute Rehab PT Goals Patient Stated Goal: To get stronger Time For Goal Achievement: 01/28/21 Potential to Achieve Goals: Fair Progress towards PT goals: Not progressing toward goals - comment    Frequency    Min 3X/week      PT Plan Current plan remains appropriate    Co-evaluation              AM-PAC PT "6 Clicks" Mobility   Outcome Measure  Help needed  turning from your back to your side while in a flat bed without using bedrails?: Total Help needed moving from lying on your back to sitting on the side of a flat bed without using bedrails?: Total Help needed moving to and from a bed to a chair (including a wheelchair)?: Total Help needed standing up from a chair using your arms (e.g., wheelchair or bedside chair)?: Total Help needed to walk in hospital room?: Total Help needed climbing 3-5 steps with a railing? : Total 6 Click Score: 6    End of Session   Activity Tolerance: Patient limited by lethargy Patient left: with call bell/phone within reach;with family/visitor present;in bed;with bed alarm set   PT Visit Diagnosis: Unsteadiness on feet (R26.81);Muscle weakness (generalized) (M62.81);Difficulty in walking, not elsewhere classified (R26.2)     Time: 3419-6222 PT Time Calculation (min) (ACUTE ONLY): 33 min  Charges:  $Therapeutic Exercise: 23-37 mins                      Jerolyn Center, PT Pager [phone]    Zena Amos 01/27/2021, 12:31 PM

## 2021-01-27 NOTE — Procedures (Signed)
Percutaneous Tracheostomy Procedure Note (Re-do)   [name]  [id number]  July 31, 1950  Date:01/27/21  Time:12:06 PM   Provider Performing:Brent Truong Delcastillo  Procedure: Percutaneous Tracheostomy with Bronchoscopic Guidance (83382)  Indication(s) Acute respiratory failure with hypoxemia requiring prolonged mechanical ventilation  Consent Risks of the procedure as well as the alternatives and risks of each were explained to the patient and/or caregiver.  Consent for the procedure was obtained.  Anesthesia Etomidate, Versed, Fentanyl, Vecuronium   Time Out Verified patient identification, verified procedure, site/side was marked, verified correct patient position, special equipment/implants available, medications/allergies/relevant history reviewed, required imaging and test results available.   Sterile Technique Maximal sterile technique including sterile barrier drape, hand hygiene, sterile gown, sterile gloves, mask, hair covering.    Procedure Description Appropriate anatomy identified by palpation.  Patient's neck prepped and draped in sterile fashion.  1% lidocaine with epinephrine was used to anesthetize skin overlying neck over the old stoma.  Blunt dissection was performed until tracheal rings could be easily palpated.   Then a size 8 Shiley tracheostomy was placed under bronchoscopic visualization using usual Seldinger technique and serial dilation.   Bronchoscope confirmed placement above the carina.  Tracheostomy was sutured in place with adhesive pad to protect skin under pressure.    Patient connected to ventilator.   Complications/Tolerance None; patient tolerated the procedure well. Chest X-ray is ordered to confirm no post-procedural complication.   EBL Minimal   Specimen(s) None   Tara [place], MD [place] PCCM Pager: [phone] Cell: [phone] After 7:00 pm call Elink  [phone]

## 2021-01-28 ENCOUNTER — Encounter (HOSPITAL_COMMUNITY): Payer: Self-pay | Admitting: Pulmonary Disease

## 2021-01-28 ENCOUNTER — Inpatient Hospital Stay (HOSPITAL_COMMUNITY): Payer: PPO

## 2021-01-28 DIAGNOSIS — J9621 Acute and chronic respiratory failure with hypoxia: Secondary | ICD-10-CM | POA: Diagnosis not present

## 2021-01-28 DIAGNOSIS — J9622 Acute and chronic respiratory failure with hypercapnia: Secondary | ICD-10-CM | POA: Diagnosis not present

## 2021-01-28 LAB — CBC
HCT: 25.6 % — ABNORMAL LOW (ref 36.0–46.0)
Hemoglobin: 7.7 g/dL — ABNORMAL LOW (ref 12.0–15.0)
MCH: 28.3 pg (ref 26.0–34.0)
MCHC: 30.1 g/dL (ref 30.0–36.0)
MCV: 94.1 fL (ref 80.0–100.0)
Platelets: 177 10*3/uL (ref 150–400)
RBC: 2.72 MIL/uL — ABNORMAL LOW (ref 3.87–5.11)
RDW: 18.6 % — ABNORMAL HIGH (ref 11.5–15.5)
WBC: 9.7 10*3/uL (ref 4.0–10.5)
nRBC: 0 % (ref 0.0–0.2)

## 2021-01-28 LAB — GLUCOSE, CAPILLARY
Glucose-Capillary: 111 mg/dL — ABNORMAL HIGH (ref 70–99)
Glucose-Capillary: 123 mg/dL — ABNORMAL HIGH (ref 70–99)
Glucose-Capillary: 138 mg/dL — ABNORMAL HIGH (ref 70–99)
Glucose-Capillary: 166 mg/dL — ABNORMAL HIGH (ref 70–99)
Glucose-Capillary: 74 mg/dL (ref 70–99)
Glucose-Capillary: 84 mg/dL (ref 70–99)
Glucose-Capillary: 86 mg/dL (ref 70–99)

## 2021-01-28 LAB — COMPREHENSIVE METABOLIC PANEL
ALT: 12 U/L (ref 0–44)
AST: 16 U/L (ref 15–41)
Albumin: 1.7 g/dL — ABNORMAL LOW (ref 3.5–5.0)
Alkaline Phosphatase: 54 U/L (ref 38–126)
Anion gap: 6 (ref 5–15)
BUN: 30 mg/dL — ABNORMAL HIGH (ref 8–23)
CO2: 26 mmol/L (ref 22–32)
Calcium: 8 mg/dL — ABNORMAL LOW (ref 8.9–10.3)
Chloride: 104 mmol/L (ref 98–111)
Creatinine, Ser: 0.96 mg/dL (ref 0.44–1.00)
GFR, Estimated: >60 mL/min (ref >60)
Glucose, Bld: 117 mg/dL — ABNORMAL HIGH (ref 70–99)
Potassium: 3.6 mmol/L (ref 3.5–5.1)
Sodium: 136 mmol/L (ref 135–145)
Total Bilirubin: 0.5 mg/dL (ref 0.3–1.2)
Total Protein: 5.7 g/dL — ABNORMAL LOW (ref 6.5–8.1)

## 2021-01-28 NOTE — Progress Notes (Signed)
NAME:  [name], MRN:  [id number], DOB:  [birth year], LOS: 25 ADMISSION DATE:  01/03/2021, CONSULTATION DATE:  01/03/2021 REFERRING MD:  EDP - [facility] CHIEF COMPLAINT:  SOB, encephalopathy   History of Present Illness:  70 yo female presented to [facility] on 7/03 with dyspnea, and altered mental status.    Had admission from 12/21/20 to 12/30/20 for respiratory failure and CHF exacerbation.Transferred to [facility]. SpO2 65% and elevated PCO2.  Failed Bipap and required intubation. Course since then complicated by prolonged mechanical ventilation requiring tracheostomy 7/7  Pertinent  Medical History  Kyphoscoliosis, Restrictive lung disease, Chronic respiratory failure on 2 liters, DM type 2, Chronic a fib on eliquis, Dementia, Chronic combined CHF, Hypothyroidism, Seizures, CKD 3a, CAD s/p NSTEMI, HLD, GERD  Significant Hospital Events:   7/3  chronic HCRF from OHS and restrictive lung dz as well as deconditioning-->progression -->decompensated HFpEF and cor pulmonale-->worsening metabolic enceph from HCRF-->PNA --> acute on chronic resp failure. Admitted. Intubated. Ceftriaxone 7/3 x 1. Azith and Cefepime started.  7/4-7/5 Hypotension overnight. A line placed. LR bolus. Started on NE. RUE PICC placed 7/6 Hgb 6.7 (7.4), CT A/P completed to r/o RP bleed (negative), showed mild peripancreatic stranding. Lipase slightly elevated. 1U PRBCs ordered. 7/7 Tracheostomy placed, heparin gtt held for procedure. 7/8 Increased agitation with Propofol wean, increased HR, uptrending pressor requirements. Cortrak placed. Azith and cefepime stopped.  7/11 PEG tube placed, tolerating PSV 7/12 ATC trials started. Tubefeeds started/resumed 7/13 solucortef decreased to daily. Midodrine stopped. Heparin gtt stopped. Stated back on DOAC. PANDA removed 7/14: sat at side of bed w/ max assist for 15 minutes. Fatigued quickly. PT recommending LTAC setting 7/15: Tolerated trach collar yesterday 7/16: TCT x 2 hours 7/18 hgb 6.8  received 1 unit PRBC. LTACH appeal denied 7/22 Tolerating brief periods of trach collar and PMV, Periods of emesis >> TFs held, Bright red blood noted by RT  7/23 left atelectasis, bronchoscopy showing inspissated clot occluding distal trachea, needed cryo probe to remove clot down to left mainstem 7/20 4 repeat bronchoscopy with cryoprobe to remove residual clot left upper lobe and lower lobe, oozing of underlying mucosa, distal clot remains 7/23 atx due to clot. Trach replaced with ETT for purpose of bronchoscopy. Inspissated clot suctioned. No obvious proximal bleeding source identified. Required TXA nebs and Cryoprobe 7/25 still w/ bloody trach secretions. Tolerating CPAP  7/26 hgb down 6.9 1 u PRBC transfused; complete white out of left hemithorax (again). Underwent bronch w/ cryoprobe for removal of clots. large clot in the left lower lobe as well as friable mucosa throughout the left tracheobronchial tree. Low grade temp. Concern for possible VAP/HCAP so sputum sent and Cefepime started.  7/27: Trach placement  Interim History / Subjective:  S/p trach 7/27 No acute change or issue overnight  C/o soreness at trach site   Objective:  Blood pressure 132/76, pulse 71, temperature 98.1 F (36.7 C), temperature source Oral, resp. rate (!) 27, height 5\' 8"  (1.727 m), weight 101 kg, SpO2 99 %.    Vent Mode: PRVC FiO2 (%):  [40 %-100 %] 40 % Set Rate:  [22 bmp] 22 bmp Vt Set:  [510 mL] 510 mL PEEP:  [5 cmH20] 5 cmH20 Plateau Pressure:  [15 cmH20-27 cmH20] 27 cmH20   Intake/Output Summary (Last 24 hours) at 01/28/2021 1007 Last data filed at 01/28/2021 0700 Gross per 24 hour  Intake 2003.16 ml  Output 2900 ml  Net -896.84 ml   Physical Exam:  General: 70 yo chronically  ill appearing female, NAD  HEENT mm moist, no JVD< new trach c/d  Pulmonary: resps even non labored on vent, diminished bases  Cardiac: Regular rate and rhythm Abdomen: Soft and nontender feeding tube intact tolerating  her bolus feeds Extremities: warm and dry, Dependent edema brisk capillary refill Neuro: Interactive moves all extremities no focal deficits GU: Clear yellow via external drainage device  Resolved Hospital Problem List   Septic shock from pneumonia Left upper extremity redness and swelling resolved Relative adrenal insufficiency -Weaned off steroids 7/15 Acute metabolic encephalopathy 2nd to sepsis and hypercapnia - improved  Hemoptysis w/ resultant atelectasis/lung collapse. Required trach removal and oral intubation 7/23 to facilitate bronchoscopy and cryoprobe. Ileus (7/22)   Assessment & Plan:   Acute on chronic hypoxic/hypercapnic respiratory failure from acute pulmonary edema, pneumonia. Restrictive lung disease from kyphoscolosis Sleep disordered breathing (mix of OHS and OSA) Failure to wean  Hemoptysis with resultant lung collapse s/p cryoprobe again 7/26 Plan Vent support - 8cc/kg  F/u CXR  F/u ABG SBT daily  S/p trach re-do 7/27 VAP bundle  Mobilize as able - chair position  PAD protocol  RASS goal -1 Pulm toilet  Cont BDs, NaCl neb Abx per below  Low grade temps w/ rising leukocytosis/SIRS (7/27) In context of hemoptysis concern about pulmonary infection/HCAP ->respiratory culture sent 7/26 Plan Day 3 cefepime Cultures pending; await results; narrow or widen as indicated  Hx of CVA, seizures, dementia, depression. Plan Keppra and Neurontin    Severe Physical deconditioning Plan PT following   Chronic permanent atrial fibrillation Acute on chronic combined CHF -ECHO 45-50% with global hypokinesis  HLD -Cardiology evaluated during admission, now signed off Plan Cont current amiodarone, lopressor and statin Cont lasix BID for now  No Eliquis for now given recurrent issue w/ hemoptysis   DM type 2 poorly controlled with hyperglycemia Plan No change in current ssi and basal coverage    Anemia of critical illness and chronic disease. PRBC's  given 7/26  Plan Am cbc Transfuse for hgb < 7   Hypothyroidism Plan Synthroid   Dysphagia post PEG placement plan Cont bolus feeds   Best Practice    Diet/type: tubefeeds DVT prophylaxis: SCD GI prophylaxis: PPI Lines: Central line and yes and it is still needed Has PICC RUE  Foley:  N/A Code Status:  full code Last date of multidisciplinary goals of care discussion: Patient and daughter updated at bedside.  Disposition: ICU  My critical care x32 min  Dirk Dress, NP Pulmonary/Critical Care Medicine  01/28/2021  10:07 AM

## 2021-01-29 DIAGNOSIS — N1831 Chronic kidney disease, stage 3a: Secondary | ICD-10-CM

## 2021-01-29 DIAGNOSIS — J9621 Acute and chronic respiratory failure with hypoxia: Secondary | ICD-10-CM | POA: Diagnosis not present

## 2021-01-29 DIAGNOSIS — R1310 Dysphagia, unspecified: Secondary | ICD-10-CM | POA: Diagnosis not present

## 2021-01-29 DIAGNOSIS — R5381 Other malaise: Secondary | ICD-10-CM

## 2021-01-29 LAB — CULTURE, RESPIRATORY W GRAM STAIN

## 2021-01-29 LAB — BASIC METABOLIC PANEL
Anion gap: 10 (ref 5–15)
BUN: 29 mg/dL — ABNORMAL HIGH (ref 8–23)
CO2: 25 mmol/L (ref 22–32)
Calcium: 8 mg/dL — ABNORMAL LOW (ref 8.9–10.3)
Chloride: 102 mmol/L (ref 98–111)
Creatinine, Ser: 0.83 mg/dL (ref 0.44–1.00)
GFR, Estimated: >60 mL/min (ref >60)
Glucose, Bld: 150 mg/dL — ABNORMAL HIGH (ref 70–99)
Potassium: 3.2 mmol/L — ABNORMAL LOW (ref 3.5–5.1)
Sodium: 137 mmol/L (ref 135–145)

## 2021-01-29 LAB — CBC
HCT: 25.3 % — ABNORMAL LOW (ref 36.0–46.0)
Hemoglobin: 7.8 g/dL — ABNORMAL LOW (ref 12.0–15.0)
MCH: 29.1 pg (ref 26.0–34.0)
MCHC: 30.8 g/dL (ref 30.0–36.0)
MCV: 94.4 fL (ref 80.0–100.0)
Platelets: 179 10*3/uL (ref 150–400)
RBC: 2.68 MIL/uL — ABNORMAL LOW (ref 3.87–5.11)
RDW: 18.2 % — ABNORMAL HIGH (ref 11.5–15.5)
WBC: 7.7 10*3/uL (ref 4.0–10.5)
nRBC: 0 % (ref 0.0–0.2)

## 2021-01-29 LAB — GLUCOSE, CAPILLARY
Glucose-Capillary: 122 mg/dL — ABNORMAL HIGH (ref 70–99)
Glucose-Capillary: 77 mg/dL (ref 70–99)
Glucose-Capillary: 144 mg/dL — ABNORMAL HIGH (ref 70–99)
Glucose-Capillary: 251 mg/dL — ABNORMAL HIGH (ref 70–99)
Glucose-Capillary: 131 mg/dL — ABNORMAL HIGH (ref 70–99)
Glucose-Capillary: 75 mg/dL (ref 70–99)

## 2021-01-29 MED ORDER — POTASSIUM CHLORIDE 10 MEQ/50ML IV SOLN
10.0000 meq | INTRAVENOUS | Status: AC
  Administered 2021-01-29 (×6): 10 meq via INTRAVENOUS
  Filled 2021-01-29 (×2): qty 50

## 2021-01-29 NOTE — Progress Notes (Signed)
eLink Physician-Brief Progress Note Patient Name: [name] DOB: 1950-11-09 MRN: [id number]   Date of Service  01/29/2021  HPI/Events of Note  Inquiry regarding water flushes as patient also receiving Lasix On 200 cc q 6 water flush Most recent Na level 137  eICU Interventions  May hold water flushes for tonight eLink to be informed of am labs and reassess need to resume water flushes     Intervention Category Major Interventions: Other:  Darl Pikes 01/29/2021, 11:47 PM

## 2021-01-29 NOTE — Progress Notes (Addendum)
Physical Therapy Treatment Patient Details Name: LAVONYA HOERNER MRN: [id number] DOB: May 25, 1951 Today's Date: 01/29/2021    History of Present Illness 70 y.o. female admitted 01/03/21 with acute on chronic respiratory failure with hypoxia. Intubated 7/3-present. 7/7: trachostomy placement. 7/27 occlusion of left lower lobe, trach replaced. PMHx:obesity, T2DM, chronic atrial fibrillation on Eliquis, dementia, diastolic CHF with chronic hypoxemia on 2 L nasal cannula, hypothyroidism, seizure disorder, CKD stage III, CAD with prior NSTEMI, dyslipidemia, and GERD.    PT Comments    Pt demonstrated improvements with overall cognitive status/alertness compared to previous sessions, and states she has been doing better the past 3 days. VSS throughout session on vent, fiO2 40% PEEP 5. Pt continues to demonstrate decreased strength globally. Pt met 0/4 goals and has been limited to bed activities due to trach procedure. Updated goals accordingly and d/c plan to reflect recent changes with denial from [facility]. Daughter mentioned wanting to take pt home; therefore, recommend [place] with 24hr supervision.    Follow Up Recommendations  Supervision/Assistance - 24 hour;Home health PT     Equipment Recommendations  Wheelchair (measurements PT);Wheelchair cushion (measurements PT);Other (comment) (hoyer lift)    Recommendations for Other Services       Precautions / Restrictions Precautions Precautions: Fall Precaution Comments: vent with trach Restrictions Weight Bearing Restrictions: No    Mobility  Bed Mobility Overal bed mobility: Needs Assistance Bed Mobility: Rolling Rolling: Mod assist;+2 for physical assistance         General bed mobility comments: Pt in bed upon arrival. Mod +2 with assist from bedrails and pad. Provided multimodal cues for hand placement    Transfers                    Ambulation/Gait                 Stairs             Wheelchair Mobility     Modified Rankin (Stroke Patients Only)       Balance       Sitting balance - Comments: Unable to assess                                    Cognition Arousal/Alertness: Awake/alert Behavior During Therapy: WFL for tasks assessed/performed Overall Cognitive Status: Within Functional Limits for tasks assessed                                 General Comments: Pt intermittently responds to one-step commands with accuracy. Multiple attempts to verbally cue      Exercises General Exercises - Upper Extremity Shoulder Flexion: AROM;Both;Other reps (comment);Supine (3reps) Elbow Flexion: AROM;Both;Supine;Other reps (comment) (3 reps) General Exercises - Lower Extremity Quad Sets: AROM;Other reps (comment);Both;Supine (2 reps) Heel Slides: AROM;Left;AAROM;Right;5 reps;Supine Straight Leg Raises: AROM;Left;AAROM;Right;5 reps    General Comments        Pertinent Vitals/Pain Pain Assessment: No/denies pain    Home Living                      Prior Function            PT Goals (current goals can now be found in the care plan section) Acute Rehab PT Goals Patient Stated Goal: To get stronger PT Goal Formulation: With patient Time For Goal Achievement: 02/11/21 Potential to  Achieve Goals: Fair Progress towards PT goals: Not progressing toward goals - comment;Goals downgraded-see care plan (Although pt has improvements with alterness and cognitive status, pt is not progressing toward goals and has been limited to activities in bed compared to previous weeks.)    Frequency    Min 3X/week      PT Plan Discharge plan needs to be updated    Co-evaluation              AM-PAC PT "6 Clicks" Mobility   Outcome Measure  Help needed turning from your back to your side while in a flat bed without using bedrails?: Total Help needed moving from lying on your back to sitting on the side of a flat bed without using bedrails?:  Total Help needed moving to and from a bed to a chair (including a wheelchair)?: Total Help needed standing up from a chair using your arms (e.g., wheelchair or bedside chair)?: Total Help needed to walk in hospital room?: Total Help needed climbing 3-5 steps with a railing? : Total 6 Click Score: 6    End of Session   Activity Tolerance: Patient tolerated treatment well Patient left: in bed;with bed alarm set;with call bell/phone within reach Nurse Communication: Mobility status;Need for lift equipment PT Visit Diagnosis: Unsteadiness on feet (R26.81);Muscle weakness (generalized) (M62.81);Difficulty in walking, not elsewhere classified (R26.2)     Time: 8323-4688 PT Time Calculation (min) (ACUTE ONLY): 18 min  Charges:  $Therapeutic Exercise: 8-22 mins                     Louie Casa, SPT Acute Rehab: [phone]    Domingo Dimes 01/29/2021, 4:07 PM

## 2021-01-29 NOTE — Progress Notes (Signed)
[facility] ADULT ICU REPLACEMENT PROTOCOL   The patient does apply for the [facility] Adult ICU Electrolyte Replacment Protocol based on the criteria listed below:   1.Exclusion criteria: TCTS patients, ECMO patients and Hypothermia Protocol, and   Dialysis patients 2. Is GFR >/= 30 ml/min? Yes.    Patient's GFR today is >60 3. Is SCr </= 2? Yes.   Patient's SCr is 0.83 mg/dL 4. Did SCr increase >/= 0.5 in 24 hours? No. 5.Pt's weight >40kg  No. 6. Abnormal electrolyte(s): protocol  7. Electrolytes replaced per protocol 8.  Call MD STAT for K+ </= 2.5, Phos </= 1, or Mag </= 1 Physician:    Markus Daft A 01/29/2021 5:24 AM

## 2021-01-29 NOTE — Progress Notes (Signed)
NAME:  [name], MRN:  [id number], DOB:  Jun 30, 1951, LOS: 26 ADMISSION DATE:  01/03/2021, CONSULTATION DATE:  01/03/2021 REFERRING MD:  EDP - [facility] CHIEF COMPLAINT:  SOB, encephalopathy   History of Present Illness:  70 yo female presented to [facility] on 7/03 with dyspnea, and altered mental status.    Had admission from 12/21/20 to 12/30/20 for respiratory failure and CHF exacerbation.Transferred to [facility]. SpO2 65% and elevated PCO2.  Failed Bipap and required intubation. Course since then complicated by prolonged mechanical ventilation requiring tracheostomy 7/7  Pertinent  Medical History  Kyphoscoliosis, Restrictive lung disease, Chronic respiratory failure on 2 liters, DM type 2, Chronic a fib on eliquis, Dementia, Chronic combined CHF, Hypothyroidism, Seizures, CKD 3a, CAD s/p NSTEMI, HLD, GERD  Significant Hospital Events:   7/3  chronic HCRF from OHS and restrictive lung dz as well as deconditioning-->progression -->decompensated HFpEF and cor pulmonale-->worsening metabolic enceph from HCRF-->PNA --> acute on chronic resp failure. Admitted. Intubated. Ceftriaxone 7/3 x 1. Azith and Cefepime started.  7/4-7/5 Hypotension overnight. A line placed. LR bolus. Started on NE. RUE PICC placed 7/6 Hgb 6.7 (7.4), CT A/P completed to r/o RP bleed (negative), showed mild peripancreatic stranding. Lipase slightly elevated. 1U PRBCs ordered. 7/7 Tracheostomy placed, heparin gtt held for procedure. 7/8 Increased agitation with Propofol wean, increased HR, uptrending pressor requirements. Cortrak placed. Azith and cefepime stopped.  7/11 PEG tube placed, tolerating PSV 7/12 ATC trials started. Tubefeeds started/resumed 7/13 solucortef decreased to daily. Midodrine stopped. Heparin gtt stopped. Stated back on DOAC. PANDA removed 7/14: sat at side of bed w/ max assist for 15 minutes. Fatigued quickly. PT recommending LTAC setting 7/15: Tolerated trach collar yesterday 7/16: TCT x 2 hours 7/18 hgb 6.8  received 1 unit PRBC. LTACH appeal denied 7/22 Tolerating brief periods of trach collar and PMV, Periods of emesis >> TFs held, Bright red blood noted by RT  7/23 left atelectasis, bronchoscopy showing inspissated clot occluding distal trachea, needed cryo probe to remove clot down to left mainstem 7/20 4 repeat bronchoscopy with cryoprobe to remove residual clot left upper lobe and lower lobe, oozing of underlying mucosa, distal clot remains 7/23 atx due to clot. Trach replaced with ETT for purpose of bronchoscopy. Inspissated clot suctioned. No obvious proximal bleeding source identified. Required TXA nebs and Cryoprobe 7/25 still w/ bloody trach secretions. Tolerating CPAP  7/26 hgb down 6.9 1 u PRBC transfused; complete white out of left hemithorax (again). Underwent bronch w/ cryoprobe for removal of clots. large clot in the left lower lobe as well as friable mucosa throughout the left tracheobronchial tree. Low grade temp. Concern for possible VAP/HCAP so sputum sent and Cefepime started.  7/27: Trach placement 7/29 tolerating PSV   Interim History / Subjective:  NAEO  Daughter at bedside   Getting K replacement    Objective:  Blood pressure 131/86, pulse 75, temperature 98.2 F (36.8 C), temperature source Oral, resp. rate 15, height 5\' 8"  (1.727 m), weight 99.1 kg, SpO2 100 %.    Vent Mode: CPAP;PSV FiO2 (%):  [28 %-40 %] 28 % Set Rate:  [22 bmp] 22 bmp Vt Set:  [510 mL] 510 mL PEEP:  [5 cmH20] 5 cmH20 Pressure Support:  [5 cmH20] 5 cmH20 Plateau Pressure:  [10 cmH20-22 cmH20] 22 cmH20   Intake/Output Summary (Last 24 hours) at 01/29/2021 1104 Last data filed at 01/29/2021 0800 Gross per 24 hour  Intake 2564.94 ml  Output 1650 ml  Net 914.94 ml   Physical  Exam:  General: acutely and chronically ill appearing older adult F trach/vent NAD  HEENT: NCAT trach secure. Anicteric sclera. Pink mm  Pulmonary: Upper lube scattered rhonchi. Symmetrical chest expansion No accessory  use on PSV  Cardiac: rrr s1s2 cap refill < 3sec  Abdomen: soft round ndnt + bowel sounds. Enteral tube Extremities: no acute joint deformity no cyanosis or clubbing  Neuro: awake alert following commands.  GU: yellow clear. Purewick   Resolved Hospital Problem List   Septic shock from pneumonia Left upper extremity redness and swelling resolved Relative adrenal insufficiency -Weaned off steroids 7/15 Acute metabolic encephalopathy 2nd to sepsis and hypercapnia - improved  Hemoptysis w/ resultant atelectasis/lung collapse. Required trach removal and oral intubation 7/23 to facilitate bronchoscopy and cryoprobe. Ileus (7/22)  Hemoptysis with resultant lung collapse s/p cryoprobe again  Leukocytosis   Assessment & Plan:   Acute on chronic hypoxic/hypercapnic respiratory failure -acute pulmonary edema -PNA  Kyphoscoliosis / restrictive lung dz OHS/OSA  Failure to wean off vent Enterobacter cloacae PNA  Hemoptysis (s/p multiple cryo) P MV support, wean a tolerated Family's goal is a home vent  VAP, pulm hygiene Mobility  Cont cefepime No eliquis   Acute on chronic combined CHF  Permanent afib HLD -ECHO 45-50% with global hypokinesis  Plan Amio, lopressor, statin Cont lasix BID for now  No eliquis with recurrent hemoptysis   Hx CVA Dementia seizure disorder  Depression P AEDs Delirium precautions  Zoloft   Severe physical deconditioning P PT/OT mobility  DM2 poorly controlled  P SSI, basal   CKD 3a Hypokalemia P -trend metabolic panel -replace lytes as needed  Anemia of chronic disease, critical illness  P CBC PRN   Hypothyroidism  P Synthroid   Dysphagia S/p PEG  P Bolus EN     Best Practice    Diet/type: tubefeeds DVT prophylaxis: SCD GI prophylaxis: PPI Lines: Central line and yes and it is still needed Has PICC RUE  Foley:  N/A Code Status:  full code Last date of multidisciplinary goals of care discussion: Pt and daughter  updated at bedside 7/29 Disposition: ICU  CRITICAL CARE Performed by: Lanier Clam   Total critical care time: 38 minutes  Critical care time was exclusive of separately billable procedures and treating other patients. Critical care was necessary to treat or prevent imminent or life-threatening deterioration.  Critical care was time spent personally by me on the following activities: development of treatment plan with patient and/or surrogate as well as nursing, discussions with consultants, evaluation of patient's response to treatment, examination of patient, obtaining history from patient or surrogate, ordering and performing treatments and interventions, ordering and review of laboratory studies, ordering and review of radiographic studies, pulse oximetry and re-evaluation of patient's condition.  Tessie Fass MSN, AGACNP-BC [facility] Pulmonary/Critical Care Medicine Amion for pager 01/29/2021, 11:04 AM

## 2021-01-30 DIAGNOSIS — J9602 Acute respiratory failure with hypercapnia: Secondary | ICD-10-CM | POA: Diagnosis not present

## 2021-01-30 DIAGNOSIS — R0689 Other abnormalities of breathing: Secondary | ICD-10-CM | POA: Diagnosis not present

## 2021-01-30 DIAGNOSIS — J9601 Acute respiratory failure with hypoxia: Secondary | ICD-10-CM | POA: Diagnosis not present

## 2021-01-30 DIAGNOSIS — R410 Disorientation, unspecified: Secondary | ICD-10-CM | POA: Diagnosis not present

## 2021-01-30 LAB — BASIC METABOLIC PANEL
Anion gap: 6 (ref 5–15)
BUN: 29 mg/dL — ABNORMAL HIGH (ref 8–23)
CO2: 25 mmol/L (ref 22–32)
Calcium: 8.2 mg/dL — ABNORMAL LOW (ref 8.9–10.3)
Chloride: 107 mmol/L (ref 98–111)
Creatinine, Ser: 0.91 mg/dL (ref 0.44–1.00)
GFR, Estimated: >60 mL/min (ref >60)
Glucose, Bld: 147 mg/dL — ABNORMAL HIGH (ref 70–99)
Potassium: 4.1 mmol/L (ref 3.5–5.1)
Sodium: 138 mmol/L (ref 135–145)

## 2021-01-30 LAB — GLUCOSE, CAPILLARY
Glucose-Capillary: 141 mg/dL — ABNORMAL HIGH (ref 70–99)
Glucose-Capillary: 123 mg/dL — ABNORMAL HIGH (ref 70–99)
Glucose-Capillary: 68 mg/dL — ABNORMAL LOW (ref 70–99)
Glucose-Capillary: 94 mg/dL (ref 70–99)
Glucose-Capillary: 182 mg/dL — ABNORMAL HIGH (ref 70–99)
Glucose-Capillary: 252 mg/dL — ABNORMAL HIGH (ref 70–99)
Glucose-Capillary: 189 mg/dL — ABNORMAL HIGH (ref 70–99)

## 2021-01-30 MED ORDER — HEPARIN SODIUM (PORCINE) 5000 UNIT/ML IJ SOLN
5000.0000 [IU] | Freq: Three times a day (TID) | INTRAMUSCULAR | Status: DC
  Administered 2021-01-30 – 2021-02-09 (×30): 5000 [IU] via SUBCUTANEOUS
  Filled 2021-01-30 (×30): qty 1

## 2021-01-30 NOTE — Progress Notes (Signed)
   01/30/21 0754  Adult Ventilator Settings  FiO2 (%) 40 %  Adult Ventilator Measurements  SpO2 96 %  Daily Weaning Assessment  Daily Assessment of Readiness to Wean Wean protocol criteria met (SBT performed)  SBT Method T-Bar/T-Collar  Weaning Start Time [phone]  Breath Sounds  Bilateral Breath Sounds Diminished  Airway Suctioning/Secretions  Suction Type Tracheal  Suction Device  Catheter  Secretion Amount Moderate  Secretion Color Pink tinged  Secretion Consistency Thick  Suction Tolerance Tolerated well  Suctioning Adverse Effects None

## 2021-01-30 NOTE — Progress Notes (Signed)
NAME:  [name], MRN:  [id number], DOB:  1951-04-14, LOS: 27 ADMISSION DATE:  01/03/2021, CONSULTATION DATE:  7/3 REFERRING MD:  EDP/APH, CHIEF COMPLAINT:  dyspnea   History of Present Illness:  70 yo female presented to [facility] on 7/03 with dyspnea, and altered mental status.    Had admission from 12/21/20 to 12/30/20 for respiratory failure and CHF exacerbation.Transferred to [facility]. SpO2 65% and elevated PCO2.  Failed Bipap and required intubation. Course since then complicated by prolonged mechanical ventilation requiring tracheostomy 7/7  Pertinent  Medical History  Kyphoscoliosis, Restrictive lung disease, Chronic respiratory failure on 2 liters, DM type 2, Chronic a fib on eliquis, Dementia, Chronic combined CHF, Hypothyroidism, Seizures, CKD 3a, CAD s/p NSTEMI, HLD, GERD  Significant Hospital Events: Including procedures, antibiotic start and stop dates in addition to other pertinent events   7/3  chronic HCRF from OHS and restrictive lung dz as well as deconditioning-->progression -->decompensated HFpEF and cor pulmonale-->worsening metabolic enceph from HCRF-->PNA --> acute on chronic resp failure. Admitted. Intubated. Ceftriaxone 7/3 x 1. Azith and Cefepime started. 7/4-7/5 Hypotension overnight. A line placed. LR bolus. Started on NE. RUE PICC placed 7/6 Hgb 6.7 (7.4), CT A/P completed to r/o RP bleed (negative), showed mild peripancreatic stranding. Lipase slightly elevated. 1U PRBCs ordered. 7/7 Tracheostomy placed, heparin gtt held for procedure. 7/8 Increased agitation with Propofol wean, increased HR, uptrending pressor requirements. Cortrak placed. Azith and cefepime stopped. 7/11 PEG tube placed, tolerating PSV 7/12 ATC trials started. Tubefeeds started/resumed 7/13 solucortef decreased to daily. Midodrine stopped. Heparin gtt stopped. Stated back on DOAC. PANDA removed 7/14: sat at side of bed w/ max assist for 15 minutes. Fatigued quickly. PT recommending LTAC  setting 7/15: Tolerated trach collar yesterday 7/16: TCT x 2 hours 7/18 hgb 6.8 received 1 unit PRBC. LTACH appeal denied 7/22 Tolerating brief periods of trach collar and PMV, Periods of emesis >> TFs held, Bright red blood noted by RT 7/23 left atelectasis, bronchoscopy showing inspissated clot occluding distal trachea, needed cryo probe to remove clot down to left mainstem 7/20 4 repeat bronchoscopy with cryoprobe to remove residual clot left upper lobe and lower lobe, oozing of underlying mucosa, distal clot remains 7/23 atx due to clot. Trach replaced with ETT for purpose of bronchoscopy. Inspissated clot suctioned. No obvious proximal bleeding source identified. Required TXA nebs and Cryoprobe 7/25 still w/ bloody trach secretions. Tolerating CPAP 7/26 hgb down 6.9 1 u PRBC transfused; complete white out of left hemithorax (again). Underwent bronch w/ cryoprobe for removal of clots. large clot in the left lower lobe as well as friable mucosa throughout the left tracheobronchial tree. Low grade temp. Concern for possible VAP/HCAP so sputum sent and Cefepime started. 7/27: Trach placement 7/29 tolerating PSV  7/30 ATC  Interim History / Subjective:  Tracheostomy collar yesterday for several hours Back on vent at night  Objective   Blood pressure 104/65, pulse 71, temperature 98.4 F (36.9 C), temperature source Oral, resp. rate (!) 22, height 5\' 8"  (1.727 m), weight 98 kg, SpO2 94 %.    Vent Mode: PRVC FiO2 (%):  [28 %-40 %] 40 % Set Rate:  [22 bmp] 22 bmp Vt Set:  [510 mL] 510 mL PEEP:  [5 cmH20] 5 cmH20 Pressure Support:  [5 cmH20] 5 cmH20 Plateau Pressure:  [18 cmH20-19 cmH20] 18 cmH20   Intake/Output Summary (Last 24 hours) at 01/30/2021 1032 Last data filed at 01/30/2021 0500 Gross per 24 hour  Intake 737.25 ml  Output 1175  ml  Net -437.75 ml   Filed Weights   01/28/21 0500 01/29/21 0500 01/30/21 0500  Weight: 101 kg 99.1 kg 98 kg    Examination:  General:  In bed,  on trach collar HENT: NCAT tracheostomy in place PULM: CTA B, normal effort CV: Irreg irreg, no mgr GI: BS+, soft, nontender MSK: diminished bulk and tone Neuro:awake, alert, follows commands   Resolved Hospital Problem list   Septic shock from pneumonia Left upper extremity redness and swelling resolved Relative adrenal insufficiency -Weaned off steroids 7/15 Acute metabolic encephalopathy 2nd to sepsis and hypercapnia - improved Hemoptysis w/ resultant atelectasis/lung collapse. Required trach removal and oral intubation 7/23 to facilitate bronchoscopy and cryoprobe. Ileus (7/22) Hemoptysis with resultant lung collapse s/p cryoprobe again  Leukocytosis Enterobacter Pneumonia  Assessment & Plan:  Acute on chronic hypoxemic respiratory failure  Kyphoscoliosis OHS/OSA Tracheobronchitis leading to hemoptysis Continue full vent support at night Trach care per routine ATC during daytime 5 days antibiotics Start back prophylactic heparin, monitor for bleeding  Acute on chronic combined CHF Permanent atrial fibrillation Hyperlipidemia Tele Amiodarone Metoprolol Lasix lipitor  History of CVA Dementia Seizure disorder Depression Sertraline Keppra  Severe physical deconditioning Continue nutrition efforts and physical therapy consultation  DM2 poorly controlled SSI + glargine  CKD 3a Hypokalemia Monitor BMET and UOP Replace electrolytes as needed  Anemia of chronic disease Monitor for bleeding Transfuse PRBC for Hgb < 7 gm/dL  Hypothyroidism synthroid  Dysphagia S/P PEG Continue tube feeding Free water   Best Practice (right click and "Reselect all SmartList Selections" daily)   Diet/type: tubefeeds DVT prophylaxis: systemic heparin GI prophylaxis: PPI Lines: Central line  Foley:  N/A Code Status:  full code Last date of multidisciplinary goals of care discussion [7/28 family confirms they want the full scope of practice]  Labs   CBC: Recent  Labs  Lab 01/23/21 2353 01/26/21 0339 01/26/21 1140 01/26/21 2335 01/28/21 0217 01/29/21 0236  WBC 10.6* 9.4  --  12.4* 9.7 7.7  HGB 8.3* 6.9* 8.3* 8.0* 7.7* 7.8*  HCT 26.4* 22.5* 27.6* 26.0* 25.6* 25.3*  MCV 93.3 94.1  --  94.2 94.1 94.4  PLT 226 172  --  172 177 179    Basic Metabolic Panel: Recent Labs  Lab 01/24/21 0340 01/25/21 0313 01/26/21 0339 01/28/21 0222 01/29/21 0236 01/30/21 0314  NA 142 142 139 136 137 138  K 3.3* 3.5 3.8 3.6 3.2* 4.1  CL 110 110 108 104 102 107  CO2 24 25 25 26 25 25   GLUCOSE 140* 207* 105* 117* 150* 147*  BUN 22 24* 28* 30* 29* 29*  CREATININE 0.89 0.93 0.85 0.96 0.83 0.91  CALCIUM 8.3* 8.0* 7.6* 8.0* 8.0* 8.2*  MG 2.1 1.8 2.1  --   --   --   PHOS 3.7 3.3 3.5  --   --   --    GFR: Estimated Creatinine Clearance: 69.4 mL/min (by C-G formula based on SCr of 0.91 mg/dL). Recent Labs  Lab 01/26/21 0339 01/26/21 2335 01/28/21 0217 01/29/21 0236  WBC 9.4 12.4* 9.7 7.7    Liver Function Tests: Recent Labs  Lab 01/28/21 0222  AST 16  ALT 12  ALKPHOS 54  BILITOT 0.5  PROT 5.7*  ALBUMIN 1.7*   No results for input(s): LIPASE, AMYLASE in the last 168 hours. No results for input(s): AMMONIA in the last 168 hours.  ABG    Component Value Date/Time   PHART 7.554 (H) 01/03/2021 2326   PCO2ART 38.1 01/03/2021 2326  PO2ART 270 (H) 01/03/2021 2326   HCO3 33.6 (H) 01/03/2021 2326   TCO2 35 (H) 01/03/2021 2326   ACIDBASEDEF 1.0 01/26/2015 1132   O2SAT 100.0 01/03/2021 2326     Coagulation Profile: No results for input(s): INR, PROTIME in the last 168 hours.  Cardiac Enzymes: No results for input(s): CKTOTAL, CKMB, CKMBINDEX, TROPONINI in the last 168 hours.  HbA1C: Hgb A1c MFr Bld  Date/Time Value Ref Range Status  11/22/2020 04:03 AM 7.6 (H) 4.8 - 5.6 % Final    Comment:    (NOTE) Pre diabetes:          5.7%-6.4%  Diabetes:              >6.4%  Glycemic control for   <7.0% adults with diabetes   08/24/2020 10:02  AM 7.9 (H) 4.8 - 5.6 % Final    Comment:    (NOTE) Pre diabetes:          5.7%-6.4%  Diabetes:              >6.4%  Glycemic control for   <7.0% adults with diabetes     CBG: Recent Labs  Lab 01/29/21 1602 01/29/21 1940 01/29/21 2316 01/30/21 0336 01/30/21 0749  GLUCAP 251* 131* 75 141* 123*    Critical care time: 35 minutes     Heber , MD  PCCM Pager: [phone] Cell: [phone] After 7:00 pm call Elink  [phone]

## 2021-01-31 DIAGNOSIS — J9602 Acute respiratory failure with hypercapnia: Secondary | ICD-10-CM | POA: Diagnosis not present

## 2021-01-31 DIAGNOSIS — J9601 Acute respiratory failure with hypoxia: Secondary | ICD-10-CM | POA: Diagnosis not present

## 2021-01-31 DIAGNOSIS — R410 Disorientation, unspecified: Secondary | ICD-10-CM | POA: Diagnosis not present

## 2021-01-31 DIAGNOSIS — R0689 Other abnormalities of breathing: Secondary | ICD-10-CM | POA: Diagnosis not present

## 2021-01-31 LAB — GLUCOSE, CAPILLARY
Glucose-Capillary: 120 mg/dL — ABNORMAL HIGH (ref 70–99)
Glucose-Capillary: 139 mg/dL — ABNORMAL HIGH (ref 70–99)
Glucose-Capillary: 158 mg/dL — ABNORMAL HIGH (ref 70–99)
Glucose-Capillary: 156 mg/dL — ABNORMAL HIGH (ref 70–99)
Glucose-Capillary: 125 mg/dL — ABNORMAL HIGH (ref 70–99)
Glucose-Capillary: 124 mg/dL — ABNORMAL HIGH (ref 70–99)

## 2021-01-31 NOTE — Progress Notes (Signed)
NAME:  [name], MRN:  [id number], DOB:  [birth year], LOS: 28 ADMISSION DATE:  01/03/2021, CONSULTATION DATE:  7/3 REFERRING MD:  EDP/APH, CHIEF COMPLAINT:  dyspnea   History of Present Illness:  70 yo female presented to [facility] on 7/03 with dyspnea, and altered mental status.    Had admission from 12/21/20 to 12/30/20 for respiratory failure and CHF exacerbation.Transferred to [facility]. SpO2 65% and elevated PCO2.  Failed Bipap and required intubation. Course since then complicated by prolonged mechanical ventilation requiring tracheostomy 7/7  Pertinent  Medical History  Kyphoscoliosis, Restrictive lung disease, Chronic respiratory failure on 2 liters, DM type 2, Chronic a fib on eliquis, Dementia, Chronic combined CHF, Hypothyroidism, Seizures, CKD 3a, CAD s/p NSTEMI, HLD, GERD  Significant Hospital Events: Including procedures, antibiotic start and stop dates in addition to other pertinent events   7/3  chronic HCRF from OHS and restrictive lung dz as well as deconditioning-->progression -->decompensated HFpEF and cor pulmonale-->worsening metabolic enceph from HCRF-->PNA --> acute on chronic resp failure. Admitted. Intubated. Ceftriaxone 7/3 x 1. Azith and Cefepime started. 7/4-7/5 Hypotension overnight. A line placed. LR bolus. Started on NE. RUE PICC placed 7/6 Hgb 6.7 (7.4), CT A/P completed to r/o RP bleed (negative), showed mild peripancreatic stranding. Lipase slightly elevated. 1U PRBCs ordered. 7/7 Tracheostomy placed, heparin gtt held for procedure. 7/8 Increased agitation with Propofol wean, increased HR, uptrending pressor requirements. Cortrak placed. Azith and cefepime stopped. 7/11 PEG tube placed, tolerating PSV 7/12 ATC trials started. Tubefeeds started/resumed 7/13 solucortef decreased to daily. Midodrine stopped. Heparin gtt stopped. Stated back on DOAC. PANDA removed 7/14: sat at side of bed w/ max assist for 15 minutes. Fatigued quickly. PT recommending LTAC  setting 7/15: Tolerated trach collar yesterday 7/16: TCT x 2 hours 7/18 hgb 6.8 received 1 unit PRBC. LTACH appeal denied 7/22 Tolerating brief periods of trach collar and PMV, Periods of emesis >> TFs held, Bright red blood noted by RT 7/23 left atelectasis, bronchoscopy showing inspissated clot occluding distal trachea, needed cryo probe to remove clot down to left mainstem 7/20 4 repeat bronchoscopy with cryoprobe to remove residual clot left upper lobe and lower lobe, oozing of underlying mucosa, distal clot remains 7/23 atx due to clot. Trach replaced with ETT for purpose of bronchoscopy. Inspissated clot suctioned. No obvious proximal bleeding source identified. Required TXA nebs and Cryoprobe 7/25 still w/ bloody trach secretions. Tolerating CPAP 7/26 hgb down 6.9 1 u PRBC transfused; complete white out of left hemithorax (again). Underwent bronch w/ cryoprobe for removal of clots. large clot in the left lower lobe as well as friable mucosa throughout the left tracheobronchial tree. Low grade temp. Concern for possible VAP/HCAP so sputum sent and Cefepime started. 7/27: Trach placement 7/29 tolerating PSV  7/30 ATC  Interim History / Subjective:   Awake On trach collar  Feels OK today No hemoptysis Some loose stools  Objective   Blood pressure 120/80, pulse 75, temperature 98.1 F (36.7 C), temperature source Oral, resp. rate 15, height 5\' 8"  (1.727 m), weight 97.6 kg, SpO2 98 %.    Vent Mode: PRVC FiO2 (%):  [35 %-40 %] 35 % Set Rate:  [22 bmp] 22 bmp Vt Set:  [510 mL-5610 mL] 510 mL PEEP:  [5 cmH20] 5 cmH20 Plateau Pressure:  [17 cmH20-18 cmH20] 18 cmH20   Intake/Output Summary (Last 24 hours) at 01/31/2021 1335 Last data filed at 01/31/2021 1000 Gross per 24 hour  Intake 474.91 ml  Output 2000 ml  Net -1525.09  ml   Filed Weights   01/29/21 0500 01/30/21 0500 01/31/21 0500  Weight: 99.1 kg 98 kg 97.6 kg    Examination:  General:  In bed, on trach collar HENT:  NCAT tracheostomy in place PULM: CTA B, normal effort CV: Irreg irreg, no mgr GI: BS+, soft, nontender MSK: diminished bulk and tone Neuro:awake, alert, follows commands   Resolved Hospital Problem list   Septic shock from pneumonia Left upper extremity redness and swelling resolved Relative adrenal insufficiency -Weaned off steroids 7/15 Acute metabolic encephalopathy 2nd to sepsis and hypercapnia - improved Hemoptysis w/ resultant atelectasis/lung collapse. Required trach removal and oral intubation 7/23 to facilitate bronchoscopy and cryoprobe. Ileus (7/22) Hemoptysis with resultant lung collapse s/p cryoprobe again  Leukocytosis Enterobacter Pneumonia  Assessment & Plan:  Acute on chronic hypoxemic respiratory failure  Kyphoscoliosis OHS/OSA Tracheobronchitis leading to hemoptysis Continue full vent support at night Trach care per routine ATC during daytime 5 days antibiotics Start back prophylactic heparin, monitor for bleeding   Acute on chronic combined CHF Permanent atrial fibrillation Hyperlipidemia Tele Amiodarone Metoprolol Lasix Lipitor  History of CVA Dementia Seizure disorder Depression Sertraline Keppra  Severe physical deconditioning Continue nutrition efforts and physical therapy consultation  DM2 poorly controlled SSI + glargine  CKD 3a Hypokalemia Monitor BMET and UOP Replace electrolytes as needed  Anemia of chronic disease Monitor for bleeding Transfuse PRBC for Hgb < 7 gm/dL  Hypothyroidism synthroid  Dysphagia S/P PEG Tube feeding Free water  Loose stool Imodium  Disposition: needs home vent. Family says that home generator won't be ready until 8/18.  Move to [facility], PCCM will follow.   Best Practice (right click and "Reselect all SmartList Selections" daily)   Diet/type: tubefeeds DVT prophylaxis: systemic heparin GI prophylaxis: PPI Lines: Central line  Foley:  N/A Code Status:  full code Last date of  multidisciplinary goals of care discussion [7/28 family confirms they want the full scope of practice]  Labs   CBC: Recent Labs  Lab 01/26/21 0339 01/26/21 1140 01/26/21 2335 01/28/21 0217 01/29/21 0236  WBC 9.4  --  12.4* 9.7 7.7  HGB 6.9* 8.3* 8.0* 7.7* 7.8*  HCT 22.5* 27.6* 26.0* 25.6* 25.3*  MCV 94.1  --  94.2 94.1 94.4  PLT 172  --  172 177 179    Basic Metabolic Panel: Recent Labs  Lab 01/25/21 0313 01/26/21 0339 01/28/21 0222 01/29/21 0236 01/30/21 0314  NA 142 139 136 137 138  K 3.5 3.8 3.6 3.2* 4.1  CL 110 108 104 102 107  CO2 25 25 26 25 25   GLUCOSE 207* 105* 117* 150* 147*  BUN 24* 28* 30* 29* 29*  CREATININE 0.93 0.85 0.96 0.83 0.91  CALCIUM 8.0* 7.6* 8.0* 8.0* 8.2*  MG 1.8 2.1  --   --   --   PHOS 3.3 3.5  --   --   --    GFR: Estimated Creatinine Clearance: 69.3 mL/min (by C-G formula based on SCr of 0.91 mg/dL). Recent Labs  Lab 01/26/21 0339 01/26/21 2335 01/28/21 0217 01/29/21 0236  WBC 9.4 12.4* 9.7 7.7    Liver Function Tests: Recent Labs  Lab 01/28/21 0222  AST 16  ALT 12  ALKPHOS 54  BILITOT 0.5  PROT 5.7*  ALBUMIN 1.7*   No results for input(s): LIPASE, AMYLASE in the last 168 hours. No results for input(s): AMMONIA in the last 168 hours.  ABG    Component Value Date/Time   PHART 7.554 (H) 01/03/2021 2326   PCO2ART  38.1 01/03/2021 2326   PO2ART 270 (H) 01/03/2021 2326   HCO3 33.6 (H) 01/03/2021 2326   TCO2 35 (H) 01/03/2021 2326   ACIDBASEDEF 1.0 01/26/2015 1132   O2SAT 100.0 01/03/2021 2326     Coagulation Profile: No results for input(s): INR, PROTIME in the last 168 hours.  Cardiac Enzymes: No results for input(s): CKTOTAL, CKMB, CKMBINDEX, TROPONINI in the last 168 hours.  HbA1C: Hgb A1c MFr Bld  Date/Time Value Ref Range Status  11/22/2020 04:03 AM 7.6 (H) 4.8 - 5.6 % Final    Comment:    (NOTE) Pre diabetes:          5.7%-6.4%  Diabetes:              >6.4%  Glycemic control for   <7.0% adults with  diabetes   08/24/2020 10:02 AM 7.9 (H) 4.8 - 5.6 % Final    Comment:    (NOTE) Pre diabetes:          5.7%-6.4%  Diabetes:              >6.4%  Glycemic control for   <7.0% adults with diabetes     CBG: Recent Labs  Lab 01/30/21 1942 01/30/21 2315 01/31/21 0327 01/31/21 0758 01/31/21 1125  GLUCAP 252* 189* 120* 139* 158*    Critical care time: n/a     Heber [place], MD [place] PCCM Pager: [phone] Cell: [phone] After 7:00 pm call Elink  [phone]

## 2021-01-31 NOTE — Progress Notes (Signed)
   01/31/21 0746  Tracheostomy Shiley Flexible 8 mm Cuffed  Placement Date/Time: 01/27/21 1200   Placed By: (c) ICU physician  Brand: Shiley Flexible  Size (mm): 8 mm  Style: Cuffed  Status Secured  Site Assessment Clean  Ties Assessment Secure  Cuff Pressure (cm H2O) MOV (Manual Technique)  Tracheostomy Equipment at bedside Yes and checklist posted at head of bed  Adult Ventilator Settings  FiO2 (%) 35 %  Adult Ventilator Measurements  SpO2 96 %  Daily Weaning Assessment  Daily Assessment of Readiness to Wean Wean protocol criteria met (SBT performed)  SBT Method T-Bar/T-Collar  Weaning Start Time [phone]  Patient response Passed (Tolerated well)  Breath Sounds  Bilateral Breath Sounds Diminished  Airway Suctioning/Secretions  Suction Type Tracheal  Suction Device  Catheter  Secretion Amount Small  Secretion Color Clear  Secretion Consistency Thin  Suction Tolerance Tolerated well  Suctioning Adverse Effects None

## 2021-02-01 DIAGNOSIS — J9601 Acute respiratory failure with hypoxia: Secondary | ICD-10-CM | POA: Diagnosis not present

## 2021-02-01 LAB — COMPREHENSIVE METABOLIC PANEL
ALT: 14 U/L (ref 0–44)
AST: 17 U/L (ref 15–41)
Albumin: 1.9 g/dL — ABNORMAL LOW (ref 3.5–5.0)
Alkaline Phosphatase: 55 U/L (ref 38–126)
Anion gap: 8 (ref 5–15)
BUN: 31 mg/dL — ABNORMAL HIGH (ref 8–23)
CO2: 26 mmol/L (ref 22–32)
Calcium: 8.5 mg/dL — ABNORMAL LOW (ref 8.9–10.3)
Chloride: 105 mmol/L (ref 98–111)
Creatinine, Ser: 0.81 mg/dL (ref 0.44–1.00)
GFR, Estimated: >60 mL/min (ref >60)
Glucose, Bld: 140 mg/dL — ABNORMAL HIGH (ref 70–99)
Potassium: 4.2 mmol/L (ref 3.5–5.1)
Sodium: 139 mmol/L (ref 135–145)
Total Bilirubin: 0.5 mg/dL (ref 0.3–1.2)
Total Protein: 5.7 g/dL — ABNORMAL LOW (ref 6.5–8.1)

## 2021-02-01 LAB — GLUCOSE, CAPILLARY
Glucose-Capillary: 129 mg/dL — ABNORMAL HIGH (ref 70–99)
Glucose-Capillary: 152 mg/dL — ABNORMAL HIGH (ref 70–99)
Glucose-Capillary: 162 mg/dL — ABNORMAL HIGH (ref 70–99)
Glucose-Capillary: 166 mg/dL — ABNORMAL HIGH (ref 70–99)
Glucose-Capillary: 187 mg/dL — ABNORMAL HIGH (ref 70–99)
Glucose-Capillary: 73 mg/dL (ref 70–99)

## 2021-02-01 LAB — CBC WITH DIFFERENTIAL/PLATELET
Abs Immature Granulocytes: 0.09 10*3/uL — ABNORMAL HIGH (ref 0.00–0.07)
Basophils Absolute: 0 10*3/uL (ref 0.0–0.1)
Basophils Relative: 1 %
Eosinophils Absolute: 0.1 10*3/uL (ref 0.0–0.5)
Eosinophils Relative: 2 %
HCT: 26.4 % — ABNORMAL LOW (ref 36.0–46.0)
Hemoglobin: 8 g/dL — ABNORMAL LOW (ref 12.0–15.0)
Immature Granulocytes: 1 %
Lymphocytes Relative: 27 %
Lymphs Abs: 1.8 10*3/uL (ref 0.7–4.0)
MCH: 28.4 pg (ref 26.0–34.0)
MCHC: 30.3 g/dL (ref 30.0–36.0)
MCV: 93.6 fL (ref 80.0–100.0)
Monocytes Absolute: 0.7 10*3/uL (ref 0.1–1.0)
Monocytes Relative: 11 %
Neutro Abs: 3.9 10*3/uL (ref 1.7–7.7)
Neutrophils Relative %: 58 %
Platelets: 210 10*3/uL (ref 150–400)
RBC: 2.82 MIL/uL — ABNORMAL LOW (ref 3.87–5.11)
RDW: 17.5 % — ABNORMAL HIGH (ref 11.5–15.5)
WBC: 6.6 10*3/uL (ref 4.0–10.5)
nRBC: 0.3 % — ABNORMAL HIGH (ref 0.0–0.2)

## 2021-02-01 NOTE — Progress Notes (Signed)
  Speech Language Pathology Treatment: [name] Speaking valve  Patient Details Name: [name] MRN: [id number] DOB: 16-Jan-1951 Today's Date: 02/01/2021 Time: 1205-1227 SLP Time Calculation (min) (ACUTE ONLY): 22 min  Assessment / Plan / Recommendation Clinical Impression  Pt was seen for treatment with her daughter present. Pt was cooperative throughout the session, but was lethargic and she exhibited difficulty maintaining alertness for prolonged periods. Pt expressed, "I feel like a zombie" and pt's daughter stated that the pt has been tired today. Vitals were RR 20, SpO2 99, and HR 20 at baseline and RR 75, SpO2 96, and HR 83 at baseline. Pt's cuff was partially deflated upon SLP's entry and pt tolerated complete deflation well. He tolerated PMSV for 20 minutes total including the time of SLP documenting outside of pt's room. Vitals range was RR 17-22, SpO2 98, and HR 82-85 during this period and pt denied any respiratory difficulty. Vocal quality was at baseline per family's report and vocal intensity was mildly reduced. Pt demonstrated dry coughing following PMSV  placement, but no expectoration was noted. Pt was able to produce words and phrases with adequate intelligibility. Pt continued to express c/o fatigue and her participation progressively waned, so the session was ultimately terminated to allow pt to rest. PMSV may be used with intermittently staff once with full supervision. SLP will continue to follow pt.    HPI HPI: 70 yo female presented to [facility] on 7/03 with dyspnea and altered mental status.  Transferred to [facility]; failed BiPAP and required intubation. 7/7 trach; 7/11 PEG. Dx acute on chronic hypoxic/hypercapnic respiratory failure from acute pulmonary edema, pneumonia; severe deconditioning; metabolic encephalopathy.  Had admission from 12/21/20 to 12/30/20 for respiratory failure and CHF exacerbation. PMHx: Kyphoscoliosis, Restrictive lung disease, Chronic respiratory failure on 2  liters, DM type 2, Chronic a fib on eliquis, Dementia, Chronic combined CHF, Hypothyroidism, Seizures, CKD 3a, CAD s/p NSTEMI, HLD, GERD.      SLP Plan  Continue with current plan of care       Recommendations  Diet recommendations: NPO      Patient may use Passy-Muir Speech Valve: Intermittently with supervision PMSV Supervision: Full MD: Please consider changing trach tube to : Smaller size         Oral Care Recommendations: Oral care BID Follow up Recommendations: Home health SLP SLP Visit Diagnosis: Aphonia (R49.1) Plan: Continue with current plan of care       Paige Monarrez I. Vear Clock, MS, CCC-SLP Acute Rehabilitation Services Office number [phone] Pager [phone]                [name] 02/01/2021, 12:38 PM

## 2021-02-01 NOTE — Progress Notes (Signed)
Physical Therapy Treatment Patient Details Name: [name] MRN: [id number] DOB: 1951-02-19 Today's Date: 02/01/2021    History of Present Illness 70 y.o. female admitted 01/03/21 with acute on chronic respiratory failure with hypoxia. Intubated 7/3-present. 7/7: trachostomy placement. 7/27 occlusion of left lower lobe, trach replaced. PMHx:obesity, T2DM, chronic atrial fibrillation on Eliquis, dementia, diastolic CHF with chronic hypoxemia on 2 L nasal cannula, hypothyroidism, seizure disorder, CKD stage III, CAD with prior NSTEMI, dyslipidemia, and GERD.    PT Comments    Pt admitted with above diagnosis. Pt was able to come to EOB with max assist of 2 and to stand with max to mod assist of 3 with pts daughter present and assisting.  Daughter appeared frustrated as pt very lethargic and she was trying to get pt to participate. Pt very lethargic but did appear to be trying when PT did arouse pt. She was able to stand with +3 assist today to [facility].  Will continue PT.  Pt currently with functional limitations due to balance and endurance deficits. Pt will benefit from skilled PT to increase their independence and safety with mobility to allow discharge to the venue listed below.      Follow Up Recommendations  Supervision/Assistance - 24 hour;Home health PT     Equipment Recommendations  Wheelchair (measurements PT);Wheelchair cushion (measurements PT);Other (comment) (hoyer lift)    Recommendations for Other Services       Precautions / Restrictions Precautions Precautions: Fall Precaution Comments: vent with trach Restrictions Weight Bearing Restrictions: No    Mobility  Bed Mobility Overal bed mobility: Needs Assistance Bed Mobility: Rolling Rolling: Mod assist;+2 for physical assistance   Supine to sit: Max assist;+2 for physical assistance     General bed mobility comments: Pt in bed upon arrival. Mod +2 for rolling as pt lethargic and not assisting initially.  Max +2 with  assist from bedrails and pad. Provided multimodal cues for hand placement    Transfers Overall transfer level: Needs assistance   Transfers: Sit to/from Stand Sit to Stand: Mod assist (+3 assist) Stand pivot transfers: Total assist;+2 safety/equipment       General transfer comment: Obtained Stedy for sit to stand. Pt needed +3 assist to stand to [facility] with first attempt max assist and didnt stand all the way up but was able on 2nd and third attempts to stand with mod assist of 3 with therapist in front of pt assisting with belt, and tech and daughter on either side of pt to assist to stand.  Pt needed constant cues and encouragement to stay awake and to participate.  However pt did much better overall today as she was able to stand close to full upright to get the buttock pads under her on the [place].  Ambulation/Gait                 Stairs             Wheelchair Mobility    Modified Rankin (Stroke Patients Only)       Balance Overall balance assessment: Needs assistance Sitting-balance support: Bilateral upper extremity supported Sitting balance-Leahy Scale: Poor Sitting balance - Comments: Pt was able to sit on EOB with very brief periods of min guard assist as pt lethargic and kept appearing to fall asleep at times and would lose balance posteriorly needing up to total assist to sit eOB. Postural control: Posterior lean Standing balance support: Bilateral upper extremity supported;During functional activity Standing balance-Leahy Scale: Poor Standing balance comment: Relies  on UE support for standing with need for +3 assist as well.                            Cognition Arousal/Alertness: Lethargic Behavior During Therapy: WFL for tasks assessed/performed Overall Cognitive Status: Within Functional Limits for tasks assessed                                 General Comments: Pt intermittently responds to one-step commands with accuracy.  Multiple attempts to verbally cue      Exercises      General Comments General comments (skin integrity, edema, etc.): Pt on 28% trach collar on arrival with VSS. At end of session pt desat to 78-82% sustained sats after PT gettign pt into chair with pt coughing up sputum as well. Incr her trach collar to 35% and sats to 92 % after a few minutes. Notified nursing of need to incr the trach collar and she was to check back on pt after she had time to recover. All other VSS.      Pertinent Vitals/Pain Pain Assessment: No/denies pain    Home Living                      Prior Function            PT Goals (current goals can now be found in the care plan section) Acute Rehab PT Goals Patient Stated Goal: To get stronger Progress towards PT goals: Progressing toward goals    Frequency    Min 3X/week      PT Plan Current plan remains appropriate    Co-evaluation              AM-PAC PT "6 Clicks" Mobility   Outcome Measure  Help needed turning from your back to your side while in a flat bed without using bedrails?: Total Help needed moving from lying on your back to sitting on the side of a flat bed without using bedrails?: Total Help needed moving to and from a bed to a chair (including a wheelchair)?: Total Help needed standing up from a chair using your arms (e.g., wheelchair or bedside chair)?: Total Help needed to walk in hospital room?: Total Help needed climbing 3-5 steps with a railing? : Total 6 Click Score: 6    End of Session Equipment Utilized During Treatment: Gait belt;Other (comment) (stedy, maxisky) Activity Tolerance: Patient limited by fatigue Patient left: with call bell/phone within reach;in chair;with chair alarm set;with family/visitor present Nurse Communication: Mobility status;Need for lift equipment PT Visit Diagnosis: Unsteadiness on feet (R26.81);Muscle weakness (generalized) (M62.81);Difficulty in walking, not elsewhere classified  (R26.2)     Time: 1005-1035 PT Time Calculation (min) (ACUTE ONLY): 30 min  Charges:  $Therapeutic Activity: 23-37 mins                     Quanell Loughney M,PT Acute Rehab Services [phone] (pager)    Bevelyn Buckles 02/01/2021, 12:55 PM

## 2021-02-01 NOTE — Progress Notes (Signed)
PROGRESS NOTE  [name]  ZOX:[id number] DOB: December 06, 1950 DOA: 01/03/2021 PCP: Lemmie Evens, MD   Brief Narrative: [name] is a 70 yo female who presented to [facility] on 7/03 with dyspnea, and altered mental status. Had admission from 12/21/20 to 12/30/20 for respiratory failure and CHF exacerbation.Transferred to [facility]. SpO2 65% and elevated PCO2.  Failed Bipap and required intubation. Course since then complicated by prolonged mechanical ventilation requiring tracheostomy 7/7. She has been managed by PCCM for 4 weeks, deemed to require home vent which is delayed by need for generator at home. This is being arranged. She was transferred to hospitalist service on 02/01/2021.   7/3  chronic HCRF from OHS and restrictive lung dz as well as deconditioning-->progression -->decompensated HFpEF and cor pulmonale-->worsening metabolic enceph from HCRF-->PNA --> acute on chronic resp failure. Admitted. Intubated. Ceftriaxone 7/3 x 1. Azith and Cefepime started. 7/4-7/5 Hypotension overnight. A line placed. 53m LR bolus. Started on NGravetteplaced 7/6 Hgb 6.7 (7.4), CT A/P completed to r/o RP bleed (negative), showed mild peripancreatic stranding. Lipase slightly elevated. 1U PRBCs ordered. 7/7 Tracheostomy placed, heparin gtt held for procedure. 7/8 Increased agitation with Propofol wean, increased HR, uptrending pressor requirements. Cortrak placed. Azith and cefepime stopped. 7/11 PEG tube placed, tolerating PSV 7/12 ATC trials started. Tubefeeds started/resumed 7/13 solucortef decreased to daily. Midodrine stopped. Heparin gtt stopped. Stated back on DOAC. PANDA removed 7/14: sat at side of bed w/ max assist for 15 minutes. Fatigued quickly. PT recommending LTAC setting 7/15: Tolerated trach collar yesterday 7/16: TCT x 2 hours 7/18 hgb 6.8 received 1 unit PRBC. LTACH appeal denied 7/22 Tolerating brief periods of trach collar and PMV, Periods of emesis >> TFs held, Bright red blood noted by  RT 7/23 left atelectasis, bronchoscopy showing inspissated clot occluding distal trachea, needed cryo probe to remove clot down to left mainstem 7/20 4 repeat bronchoscopy with cryoprobe to remove residual clot left upper lobe and lower lobe, oozing of underlying mucosa, distal clot remains 7/23 atx due to clot. Trach replaced with ETT for purpose of bronchoscopy. Inspissated clot suctioned. No obvious proximal bleeding source identified. Required TXA nebs and Cryoprobe 7/25 still w/ bloody trach secretions. Tolerating CPAP 7/26 hgb down 6.9 1 u PRBC transfused; complete white out of left hemithorax (again). Underwent bronch w/ cryoprobe for removal of clots. large clot in the left lower lobe as well as friable mucosa throughout the left tracheobronchial tree. Low grade temp. Concern for possible VAP/HCAP so sputum sent and Cefepime started. 7/27: Trach placement 7/29 tolerating PSV  7/30 ATC  Assessment & Plan: Active Problems:   Acute respiratory failure with hypoxia and hypercapnia (HCC)   Acute on chronic respiratory failure with hypoxia and hypercapnia (HCC)   Hypercapnic respiratory failure (HCC)   Status post tracheostomy ([facility]   Encephalopathy acute   Hemoptysis  Acute on chronic hypoxemic and hypercarbic respiratory failure: Multifactorial including restrictive lung disease due to kyphoscoliosis, OHS/OSA, tracheobronchitis leading to hemoptysis.  - Per PCCM   Acute on chronic combined CHF: Volume status improved.  Permanent atrial fibrillation - Continue metoprolol, amiodarone - Anticoagulation on hold due to anemia, hemoptysis requiring bronch which showed friable mucosa.  - Continue lasix.    History of CVA - Continue statin  Dementia:  - Delirium precautions  Seizure disorder, depression - Continue sertraline, keppra   Severe physical deconditioning:  - Appreciate PT efforts, will need DME and 24 hrs assistance. [place] [place]    T2DM:  HbA1c  7.6% - Continue lantus + SSI. At inpatient goal.    Stage IIIa CKD: Currently better than that diagnosis would suggest.  - Avoid nephrotoxins  Hypokalemia:  - Resolved   Anemia of chronic disease exacerbated by acute blood loss anemia due to hemoptysis (improving): Hgb stable.  - Monitor intermittently.    Hypothyroidism: TSH 0.697 - Continue synthroid.   Dysphagia s/p PEG:  - Continue tube feeds, free water.   Loose stool: Likely related to tube feeds with benign exam.  - Continue rectal tube for now - Continue imodium with low suspicion for GI pathogen.    Disposition: needs home vent. Family says that home generator won't be ready until 8/18.  Move to [facility], PCCM will follow.  Will see if can liberate from nightly vent while she is still here.  DVT prophylaxis: Heparin Code Status: Full (confirmed) Family Communication: None at bedside Disposition Plan:  Status is: Inpatient  Remains inpatient appropriate because:Unsafe d/c plan  Dispo: The patient is from: Home              Anticipated d/c is to: Home              Patient currently is medically stable to d/c.   Difficult to place patient No  Consultants:  PCCM  Subjective: Didn't sleep well, on full support, no fevers. No chest pain. Just steady, constant shortness of breath. Denies any other concerns.   Objective: Vitals:   02/01/21 0700 02/01/21 0800 02/01/21 0835 02/01/21 0900  BP: (!) 119/59 (!) 130/57  116/67  Pulse: 73 77 86 77  Resp: 11 19 (!) 24 (!) 21  Temp: 98.6 F (37 C)     TempSrc: Oral     SpO2: 99% 98% 98% 94%  Weight:      Height:        Intake/Output Summary (Last 24 hours) at 02/01/2021 0034 Last data filed at 02/01/2021 0500 Gross per 24 hour  Intake 985 ml  Output 2475 ml  Net -1490 ml   Filed Weights   01/30/21 0500 01/31/21 0500 02/01/21 0500  Weight: 98 kg 97.6 kg 94.6 kg    Gen: Ill-appearing female appears very frail Pulm: Non-labored, no wheezes or crackles.  CV: Irreg  irreg, rate in 70's. No murmur, rub, or gallop. No JVD, no pitting pedal edema. GI: Abdomen soft, obese, non-tender, non-distended, with normoactive bowel sounds. No organomegaly or masses felt. Ext: Warm, dry Skin: Pale, PEG, trach sites without purulence.  Neuro: Alert, interactive, moves weakly all extremities, follows commands. Psych: Calm  Data Reviewed: I have personally reviewed following labs and imaging studies  CBC: Recent Labs  Lab 01/26/21 0339 01/26/21 1140 01/26/21 2335 01/28/21 0217 01/29/21 0236 02/01/21 0336  WBC 9.4  --  12.4* 9.7 7.7 6.6  NEUTROABS  --   --   --   --   --  3.9  HGB 6.9* 8.3* 8.0* 7.7* 7.8* 8.0*  HCT 22.5* 27.6* 26.0* 25.6* 25.3* 26.4*  MCV 94.1  --  94.2 94.1 94.4 93.6  PLT 172  --  172 177 179 917   Basic Metabolic Panel: Recent Labs  Lab 01/26/21 0339 01/28/21 0222 01/29/21 0236 01/30/21 0314 02/01/21 0336  NA 139 136 137 138 139  K 3.8 3.6 3.2* 4.1 4.2  CL 108 104 102 107 105  CO2 _0 GLUCOSE 105* 117* 150* 147* 140*  BUN 28* 30* 29* 29* 31*  CREATININE 0.85 0.96 0.83  0.91 0.81  CALCIUM 7.6* 8.0* 8.0* 8.2* 8.5*  MG 2.1  --   --   --   --   PHOS 3.5  --   --   --   --    GFR: Estimated Creatinine Clearance: 76.6 mL/min (by C-G formula based on SCr of 0.81 mg/dL). Liver Function Tests: Recent Labs  Lab 01/28/21 0222 02/01/21 0336  AST 16 17  ALT 12 14  ALKPHOS 54 55  BILITOT 0.5 0.5  PROT 5.7* 5.7*  ALBUMIN 1.7* 1.9*   No results for input(s): LIPASE, AMYLASE in the last 168 hours. No results for input(s): AMMONIA in the last 168 hours. Coagulation Profile: No results for input(s): INR, PROTIME in the last 168 hours. Cardiac Enzymes: No results for input(s): CKTOTAL, CKMB, CKMBINDEX, TROPONINI in the last 168 hours. BNP (last 3 results) No results for input(s): PROBNP in the last 8760 hours. HbA1C: No results for input(s): HGBA1C in the last 72 hours. CBG: Recent Labs  Lab 01/31/21 1533  01/31/21 1942 01/31/21 2318 02/01/21 0317 02/01/21 0741  GLUCAP 156* 125* 124* 129* 73   Lipid Profile: No results for input(s): CHOL, HDL, LDLCALC, TRIG, CHOLHDL, LDLDIRECT in the last 72 hours. Thyroid Function Tests: No results for input(s): TSH, T4TOTAL, FREET4, T3FREE, THYROIDAB in the last 72 hours. Anemia Panel: No results for input(s): VITAMINB12, FOLATE, FERRITIN, TIBC, IRON, RETICCTPCT in the last 72 hours. Urine analysis:    Component Value Date/Time   COLORURINE YELLOW 01/03/2021 2044   APPEARANCEUR HAZY (A) 01/03/2021 2044   LABSPEC 1.020 01/03/2021 2044   PHURINE 6.0 01/03/2021 2044   GLUCOSEU 50 (A) 01/03/2021 2044   HGBUR SMALL (A) 01/03/2021 2044   BILIRUBINUR NEGATIVE 01/03/2021 2044   KETONESUR 80 (A) 01/03/2021 2044   PROTEINUR NEGATIVE 01/03/2021 2044   UROBILINOGEN 0.2 02/05/2015 1700   NITRITE NEGATIVE 01/03/2021 2044   LEUKOCYTESUR SMALL (A) 01/03/2021 2044   Recent Results (from the past 240 hour(s))  Culture, Respiratory w Gram Stain     Status: None   Collection Time: 01/26/21  9:06 PM   Specimen: Tracheal Aspirate; Respiratory  Result Value Ref Range Status   Specimen Description TRACHEAL ASPIRATE  Final   Special Requests NONE  Final   Gram Stain   Final    FEW WBC PRESENT, PREDOMINANTLY PMN FEW GRAM POSITIVE RODS RARE GRAM POSITIVE COCCI IN PAIRS Performed at [facility], [place] [address], [place], [place] [address]    Culture MODERATE ENTEROBACTER CLOACAE  Final   Report Status 01/29/2021 FINAL  Final   Organism ID, Bacteria ENTEROBACTER CLOACAE  Final      Susceptibility   Enterobacter cloacae - MIC*    CEFAZOLIN >=64 RESISTANT Resistant     CEFEPIME <=0.12 SENSITIVE Sensitive     CEFTAZIDIME <=1 SENSITIVE Sensitive     CIPROFLOXACIN <=0.25 SENSITIVE Sensitive     GENTAMICIN <=1 SENSITIVE Sensitive     IMIPENEM 0.5 SENSITIVE Sensitive     TRIMETH/SULFA <=20 SENSITIVE Sensitive     PIP/TAZO <=4 SENSITIVE Sensitive     *  MODERATE ENTEROBACTER CLOACAE      Radiology Studies: No results found.  Scheduled Meds:  amiodarone  200 mg Per Tube Daily   atorvastatin  80 mg Per Tube Daily   chlorhexidine gluconate (MEDLINE KIT)  15 mL Mouth Rinse BID   Chlorhexidine Gluconate Cloth  6 each Topical Q0600   feeding supplement (KATE FARMS STANDARD 1.4)  325 mL Per Tube QID  feeding supplement (PROSource TF)  45 mL Per Tube TID   free water  200 mL Per Tube Q6H   furosemide  20 mg Intravenous BID   gabapentin  100 mg Per Tube Q8H   heparin injection (subcutaneous)  5,000 Units Subcutaneous Q8H   insulin aspart  0-15 Units Subcutaneous Q4H   insulin aspart  10 Units Subcutaneous QID   insulin glargine  10 Units Subcutaneous Daily   levETIRAcetam  1,000 mg Per Tube BID   levothyroxine  175 mcg Per Tube Q0600   mouth rinse  15 mL Mouth Rinse 10 times per day   metoprolol tartrate  25 mg Per Tube BID   pantoprazole sodium  40 mg Per Tube Daily   sertraline  25 mg Per Tube Daily   sodium chloride flush  10-40 mL Intracatheter Q12H   Continuous Infusions:  sodium chloride Stopped (01/31/21 1315)     LOS: 29 days   Time spent: 35 minutes.  Patrecia Pour, MD [facility] [email or web address] 02/01/2021, 9:09 AM

## 2021-02-01 NOTE — Progress Notes (Signed)
RT placed pt on ATC 5L 28% per MD. Pt is tolerating ATC very well at this time with SVS. RT will continue to monitor pt.

## 2021-02-01 NOTE — Progress Notes (Signed)
NAME:  [name], MRN:  [id number], DOB:  01/30/1951, LOS: 29 ADMISSION DATE:  01/03/2021, CONSULTATION DATE:  7/3 REFERRING MD:  [facility]P/[facility], CHIEF COMPLAINT:  dyspnea   History of Present Illness:  70 yo female presented to [facility] on 7/03 with dyspnea, and altered mental status.    Had admission from 12/21/20 to 12/30/20 for respiratory failure and CHF exacerbation.Transferred to [facility]. SpO2 65% and elevated PCO2.  Failed Bipap and required intubation. Course since then complicated by prolonged mechanical ventilation requiring tracheostomy 7/7  Pertinent  Medical History  Kyphoscoliosis, Restrictive lung disease, Chronic respiratory failure on 2 liters, DM type 2, Chronic a fib on eliquis, Dementia, Chronic combined CHF, Hypothyroidism, Seizures, CKD 3a, CAD s/p NSTEMI, HLD, GERD  Significant Hospital Events: Including procedures, antibiotic start and stop dates in addition to other pertinent events   7/3  chronic HCRF from OHS and restrictive lung dz as well as deconditioning-->progression -->decompensated HFpEF and cor pulmonale-->worsening metabolic enceph from HCRF-->PNA --> acute on chronic resp failure. Admitted. Intubated. Ceftriaxone 7/3 x 1. Azith and Cefepime started. 7/4-7/5 Hypotension overnight. A line placed. LR bolus. Started on NE. RUE PICC placed 7/6 Hgb 6.7 (7.4), CT A/P completed to r/o RP bleed (negative), showed mild peripancreatic stranding. Lipase slightly elevated. 1U PRBCs ordered. 7/7 Tracheostomy placed, heparin gtt held for procedure. 7/8 Increased agitation with Propofol wean, increased HR, uptrending pressor requirements. Cortrak placed. Azith and cefepime stopped. 7/11 PEG tube placed, tolerating PSV 7/12 ATC trials started. Tubefeeds started/resumed 7/13 solucortef decreased to daily. Midodrine stopped. Heparin gtt stopped. Stated back on DOAC. PANDA removed 7/14: sat at side of bed w/ max assist for 15 minutes. Fatigued quickly. PT recommending LTAC  setting 7/15: Tolerated trach collar yesterday 7/16: TCT x 2 hours 7/18 hgb 6.8 received 1 unit PRBC. LTACH appeal denied 7/22 Tolerating brief periods of trach collar and PMV, Periods of emesis >> TFs held, Bright red blood noted by RT 7/23 left atelectasis, bronchoscopy showing inspissated clot occluding distal trachea, needed cryo probe to remove clot down to left mainstem 7/20 4 repeat bronchoscopy with cryoprobe to remove residual clot left upper lobe and lower lobe, oozing of underlying mucosa, distal clot remains 7/23 atx due to clot. Trach replaced with ETT for purpose of bronchoscopy. Inspissated clot suctioned. No obvious proximal bleeding source identified. Required TXA nebs and Cryoprobe 7/25 still w/ bloody trach secretions. Tolerating CPAP 7/26 hgb down 6.9 1 u PRBC transfused; complete white out of left hemithorax (again). Underwent bronch w/ cryoprobe for removal of clots. large clot in the left lower lobe as well as friable mucosa throughout the left tracheobronchial tree. Low grade temp. Concern for possible VAP/HCAP so sputum sent and Cefepime started. 7/27: Trach placement 7/29 tolerating PSV  7/30 ATC  Interim History / Subjective:  No events, On full support  Objective   Blood pressure (!) 113/53, pulse 67, temperature 98.6 F (37 C), temperature source Oral, resp. rate 20, height 5\' 8"  (1.727 m), weight 94.6 kg, SpO2 99 %.    Vent Mode: PRVC FiO2 (%):  [35 %-40 %] 40 % Set Rate:  [22 bmp] 22 bmp Vt Set:  [510 mL] 510 mL PEEP:  [5 cmH20] 5 cmH20 Plateau Pressure:  [14 cmH20-15 cmH20] 14 cmH20   Intake/Output Summary (Last 24 hours) at 02/01/2021 0720 Last data filed at 02/01/2021 0500 Gross per 24 hour  Intake 985 ml  Output 2475 ml  Net -1490 ml    Filed Weights   01/30/21  0500 01/31/21 0500 02/01/21 0500  Weight: 98 kg 97.6 kg 94.6 kg    Examination: No distress Lungs diminished on L, triggering vent Thick bloody secretions in trach Abd soft, PEG in  place Ext with muscle wasting Moves all 4 ext to command, weak  CBG okay CMP benign CBC stable No new chest imaging  Resolved Hospital Problem list   Septic shock from pneumonia Left upper extremity redness and swelling resolved Relative adrenal insufficiency -Weaned off steroids 7/15 Acute metabolic encephalopathy 2nd to sepsis and hypercapnia - improved Hemoptysis w/ resultant atelectasis/lung collapse. Required trach removal and oral intubation 7/23 to facilitate bronchoscopy and cryoprobe. Ileus (7/22) Hemoptysis with resultant lung collapse s/p cryoprobe again  Leukocytosis Enterobacter Pneumonia  Assessment & Plan:  Acute on chronic hypoxemic respiratory failure  Kyphoscoliosis OHS/OSA Tracheobronchitis leading to hemoptysis Trial of around the clock ATC 5 days antibiotics Continue prophylactic heparin, monitor for bleeding; consider advancing to heparin gtt tomorrow if H/H and secretions stable  Acute on chronic combined CHF Permanent atrial fibrillation Hyperlipidemia Tele Amiodarone Metoprolol Lasix Lipitor  History of CVA Dementia Seizure disorder Depression Sertraline Keppra  Severe physical deconditioning Continue nutrition efforts and physical therapy consultation  DM2 SSI + glargine  CKD 3a Hypokalemia Monitor BMET and UOP Replace electrolytes as needed  Anemia of chronic disease Monitor for bleeding Transfuse PRBC for Hgb < 7 gm/dL  Hypothyroidism synthroid  Dysphagia S/P PEG Tube feeding Free water  Loose stool Imodium  Disposition: needs home vent. Family says that home generator won't be ready until 8/18.  Move to [facility], PCCM will follow.  Will see if can liberate from nightly vent while she is still here.   Best Practice (right click and "Reselect all SmartList Selections" daily)   Diet/type: tubefeeds DVT prophylaxis: systemic heparin GI prophylaxis: PPI Lines: Central line  Foley:  N/A Code Status:  full code Last  date of multidisciplinary goals of care discussion [7/28 family confirms they want the full scope of practice]  Myrla Halsted MD PCCM

## 2021-02-01 NOTE — Progress Notes (Signed)
CPT held this AM, pt wanting to rest.

## 2021-02-02 DIAGNOSIS — Z930 Tracheostomy status: Secondary | ICD-10-CM

## 2021-02-02 DIAGNOSIS — Z794 Long term (current) use of insulin: Secondary | ICD-10-CM

## 2021-02-02 DIAGNOSIS — R5381 Other malaise: Secondary | ICD-10-CM

## 2021-02-02 DIAGNOSIS — J9601 Acute respiratory failure with hypoxia: Secondary | ICD-10-CM | POA: Diagnosis not present

## 2021-02-02 DIAGNOSIS — E119 Type 2 diabetes mellitus without complications: Secondary | ICD-10-CM

## 2021-02-02 LAB — CBC
HCT: 28.6 % — ABNORMAL LOW (ref 36.0–46.0)
Hemoglobin: 8.8 g/dL — ABNORMAL LOW (ref 12.0–15.0)
MCH: 28.3 pg (ref 26.0–34.0)
MCHC: 30.8 g/dL (ref 30.0–36.0)
MCV: 92 fL (ref 80.0–100.0)
Platelets: 248 10*3/uL (ref 150–400)
RBC: 3.11 MIL/uL — ABNORMAL LOW (ref 3.87–5.11)
RDW: 17.8 % — ABNORMAL HIGH (ref 11.5–15.5)
WBC: 7.7 10*3/uL (ref 4.0–10.5)
nRBC: 0 % (ref 0.0–0.2)

## 2021-02-02 LAB — BASIC METABOLIC PANEL
Anion gap: 9 (ref 5–15)
BUN: 29 mg/dL — ABNORMAL HIGH (ref 8–23)
CO2: 26 mmol/L (ref 22–32)
Calcium: 8.7 mg/dL — ABNORMAL LOW (ref 8.9–10.3)
Chloride: 100 mmol/L (ref 98–111)
Creatinine, Ser: 0.77 mg/dL (ref 0.44–1.00)
GFR, Estimated: >60 mL/min (ref >60)
Glucose, Bld: 127 mg/dL — ABNORMAL HIGH (ref 70–99)
Potassium: 3.9 mmol/L (ref 3.5–5.1)
Sodium: 135 mmol/L (ref 135–145)

## 2021-02-02 LAB — GLUCOSE, CAPILLARY
Glucose-Capillary: 122 mg/dL — ABNORMAL HIGH (ref 70–99)
Glucose-Capillary: 108 mg/dL — ABNORMAL HIGH (ref 70–99)
Glucose-Capillary: 205 mg/dL — ABNORMAL HIGH (ref 70–99)
Glucose-Capillary: 246 mg/dL — ABNORMAL HIGH (ref 70–99)
Glucose-Capillary: 259 mg/dL — ABNORMAL HIGH (ref 70–99)
Glucose-Capillary: 198 mg/dL — ABNORMAL HIGH (ref 70–99)

## 2021-02-02 MED ORDER — METOPROLOL TARTRATE 12.5 MG HALF TABLET
12.5000 mg | ORAL_TABLET | Freq: Two times a day (BID) | ORAL | Status: DC
  Administered 2021-02-02 – 2021-02-09 (×14): 12.5 mg
  Filled 2021-02-02 (×15): qty 1

## 2021-02-02 MED ORDER — INSULIN GLARGINE 100 UNIT/ML ~~LOC~~ SOLN
20.0000 [IU] | Freq: Two times a day (BID) | SUBCUTANEOUS | Status: DC
  Administered 2021-02-02 – 2021-02-04 (×6): 20 [IU] via SUBCUTANEOUS
  Filled 2021-02-02 (×8): qty 0.2

## 2021-02-02 MED ORDER — DEXMEDETOMIDINE HCL IN NACL 400 MCG/100ML IV SOLN: 0.4000 ug/kg/h | INTRAVENOUS | Status: DC

## 2021-02-02 NOTE — TOC Progression Note (Addendum)
Transition of Care [facility]) - Progression Note    Patient Details  Name: [name] MRN: [id number] Date of Birth: 1951-06-29  Transition of Care [facility]) CM/SW [place], RN Phone Number: 02/02/2021, 11:40 AM  Clinical Narrative:    Case management called and spoke with Whitman Hero, [facility] and received care of patient and placed on DTP Team for transitions of care needs.  CM called and spoke with Marlowe Kays, [facility] with [employer] to coordinate Home Health provider and custodial respite care for home.  Home Health agencies and custodial care agencies were printed and will be given to the daughter as well.  I met with the patient and daughter at the bedside and patient currently has dme at home including manual hospital bed, tube feeds through [employer] per daughter, WC, lift chair, BSC, purewick device and pending vent and suction equipment to arrive at bedside through [place] today at noon with daughter.  I called and left a message with Bethanne Ginger, CM with [employer] to discuss equipment needs for home.The daughter states that she would like the current hospital bed updated to an electric bed if possible.  I emailed Shanon Rosser and requested assistance with Medicaid screening and possible application to assist with payment of private duty nursing at the home.  I called and left a message with Tommi Rumps, RNCM with [facility] to discuss home health needs upon discharge from the hospital to home.  I called and left a message with [place] for possible private duty nursing at the home via private pay.  CM and MSW with DTP Team will continue to follow for home health and dme needs.  02/02/2021 - CM and [employer] met with the patient, daughter, and husband at the bedside.  The patient's family met with [employer] RT to learn home ventilator equipment.  The patient's daughter and husband plan to spend the night next week to learn airway management, care of the patient, suctioning and bedside care  for home.  [employer] is going to provide an all electric bed for the patient and deliver to the home prior to discharge.  The daughter plans to call [facility] and have them pick up the hospital bed from the home.  [facility] - declined patient for home health due to trach [facility] - decline patient for home health [facility] - declined due to no providers in the area [facility] - declined due to new trach [facility] - declined due to new trach [place] - declined  Madilyn Fireman, MSW spoke with [facility] C[facility] to assist with home care needs at this time.  I called and spoke with Marlowe Kays, [facility] with [employer] and updated her that I have been unable to establish a home health company to provide RN for home at this time and she will call and follow up with Amy, RNCM with [employer] insurance to assist.  CM called and found a custodial home health company to provide home health aide custodial care for the home through [place] at 27.00 per hour - [phone].  I gave this company contact and information to the daughter and she will follow up about private pay and hours needed in the home.  CM and MSW with DTP Team will continue to follow for [facility] needs for discharge to home.     Expected Discharge Plan: [place] Barriers to Discharge: Continued Medical Work up  Expected Discharge Plan and Services Expected Discharge  Plan: [place] In-house Referral: Clinical Social Work Discharge Planning Services: CM Consult Post Acute Care Choice: Durable Medical Equipment, Resumption of Svcs/PTA Provider Living arrangements for the past 2 months: Single Family Home                   DME Agency: [employer] Date DME Agency Contacted: 02/02/21 Time DME Agency Contacted: 1129 Representative spoke with at DME Agency: Called and left a message with Bethanne Ginger, CM with [employer] to discuss pending equipment needs for home.     Date HH Agency Contacted:  02/02/21 Time [place]: [id number] Representative spoke with at [place]: Tommi Rumps, [facility] with [place], left message with [place] - [phone]   Social Determinants of Health (SDOH) Interventions    Readmission Risk Interventions Readmission Risk Prevention Plan 12/28/2020 11/24/2020 09/21/2020  Transportation Screening Complete Complete Complete  HRI or [place] - Complete -  Social Work Consult for [place] Planning/Counseling - Complete -  Palliative Care Screening - Not Applicable -  Medication Review Press photographer) Complete Complete Complete  PCP or Specialist appointment within 3-5 days of discharge - - Complete  HRI or Home Care Consult Complete - Complete  SW Recovery Care/Counseling Consult Complete - Complete  Palliative Care Screening (No Data) - Complete  [place] Patient Refused - Complete  Some recent data might be hidden

## 2021-02-02 NOTE — Progress Notes (Signed)
CSW spoke with [name] of [facility] to inform her of patient's home health needs and denials by companies due to patient being a new trach.  Edwin Dada, MSW, LCSW Transitions of Care  Clinical Social Worker II [phone]

## 2021-02-02 NOTE — Progress Notes (Signed)
NAME:  [name], MRN:  [id number], DOB:  Mar 19, 1951, LOS: 30 ADMISSION DATE:  01/03/2021, CONSULTATION DATE:  7/3 REFERRING MD:  EDP/APH, CHIEF COMPLAINT:  dyspnea   History of Present Illness:  70 yo female presented to [facility] on 7/03 with dyspnea, and altered mental status.    Had admission from 12/21/20 to 12/30/20 for respiratory failure and CHF exacerbation.Transferred to [facility]. SpO2 65% and elevated PCO2.  Failed Bipap and required intubation. Course since then complicated by prolonged mechanical ventilation requiring tracheostomy 7/7  Pertinent  Medical History  Kyphoscoliosis, Restrictive lung disease, Chronic respiratory failure on 2 liters, DM type 2, Chronic a fib on eliquis, Dementia, Chronic combined CHF, Hypothyroidism, Seizures, CKD 3a, CAD s/p NSTEMI, HLD, GERD  Significant Hospital Events: Including procedures, antibiotic start and stop dates in addition to other pertinent events   7/3  chronic HCRF from OHS and restrictive lung dz as well as deconditioning-->progression -->decompensated HFpEF and cor pulmonale-->worsening metabolic enceph from HCRF-->PNA --> acute on chronic resp failure. Admitted. Intubated. Ceftriaxone 7/3 x 1. Azith and Cefepime started. 7/4-7/5 Hypotension overnight. A line placed. LR bolus. Started on NE. RUE PICC placed 7/6 Hgb 6.7 (7.4), CT A/P completed to r/o RP bleed (negative), showed mild peripancreatic stranding. Lipase slightly elevated. 1U PRBCs ordered. 7/7 Tracheostomy placed, heparin gtt held for procedure. 7/8 Increased agitation with Propofol wean, increased HR, uptrending pressor requirements. Cortrak placed. Azith and cefepime stopped. 7/11 PEG tube placed, tolerating PSV 7/12 ATC trials started. Tubefeeds started/resumed 7/13 solucortef decreased to daily. Midodrine stopped. Heparin gtt stopped. Stated back on DOAC. PANDA removed 7/14: sat at side of bed w/ max assist for 15 minutes. Fatigued quickly. PT recommending LTAC  setting 7/15: Tolerated trach collar yesterday 7/16: TCT x 2 hours 7/18 hgb 6.8 received 1 unit PRBC. LTACH appeal denied 7/22 Tolerating brief periods of trach collar and PMV, Periods of emesis >> TFs held, Bright red blood noted by RT 7/23 left atelectasis, bronchoscopy showing inspissated clot occluding distal trachea, needed cryo probe to remove clot down to left mainstem 7/20 4 repeat bronchoscopy with cryoprobe to remove residual clot left upper lobe and lower lobe, oozing of underlying mucosa, distal clot remains 7/23 atx due to clot. Trach replaced with ETT for purpose of bronchoscopy. Inspissated clot suctioned. No obvious proximal bleeding source identified. Required TXA nebs and Cryoprobe 7/25 still w/ bloody trach secretions. Tolerating CPAP 7/26 hgb down 6.9 1 u PRBC transfused; complete white out of left hemithorax (again). Underwent bronch w/ cryoprobe for removal of clots. large clot in the left lower lobe as well as friable mucosa throughout the left tracheobronchial tree. Low grade temp. Concern for possible VAP/HCAP so sputum sent and Cefepime started. 7/27: Trach placement 7/29 tolerating PSV  7/30 ATC  Interim History / Subjective:  No O/N Events.   On full support, states that she is doing well, stools are doing better, breathing well. No nausea, vomiting, dyspnea  Objective   Blood pressure (!) 103/57, pulse 64, temperature 98.1 F (36.7 C), temperature source Oral, resp. rate (!) 22, height 5\' 8"  (1.727 m), weight 94.6 kg, SpO2 97 %.    Vent Mode: PRVC FiO2 (%):  [28 %-40 %] 30 % Set Rate:  [22 bmp] 22 bmp Vt Set:  [510 mL] 510 mL PEEP:  [5 cmH20] 5 cmH20 Plateau Pressure:  [12 cmH20-17 cmH20] 12 cmH20   Intake/Output Summary (Last 24 hours) at 02/02/2021 0725 Last data filed at 02/02/2021 0600 Gross per 24 hour  Intake 1410 ml  Output 900 ml  Net 510 ml   Filed Weights   01/31/21 0500 02/01/21 0500 02/02/21 0500  Weight: 97.6 kg 94.6 kg 94.6 kg     Examination: Physical Exam Constitutional:      General: She is not in acute distress.    Appearance: Normal appearance. She is not ill-appearing or toxic-appearing.  Cardiovascular:     Rate and Rhythm: Normal rate. Rhythm irregular.     Heart sounds: No murmur heard.   No gallop.     Comments: Irregularly irregular pulse Pulmonary:     Effort: Pulmonary effort is normal. No respiratory distress.     Breath sounds: Normal breath sounds. No wheezing or rales.  Abdominal:     General: Bowel sounds are normal.     Palpations: Abdomen is soft.     Tenderness: There is no abdominal tenderness. There is no guarding.  Musculoskeletal:        General: No tenderness.     Right lower leg: No edema.     Left lower leg: No edema.  Neurological:     Mental Status: She is alert.     Resolved Hospital Problem list   Septic shock from pneumonia Left upper extremity redness and swelling resolved Relative adrenal insufficiency -Weaned off steroids 7/15 Acute metabolic encephalopathy 2nd to sepsis and hypercapnia - improved Hemoptysis w/ resultant atelectasis/lung collapse. Required trach removal and oral intubation 7/23 to facilitate bronchoscopy and cryoprobe. Ileus (7/22) Hemoptysis with resultant lung collapse s/p cryoprobe again  Leukocytosis Enterobacter Pneumonia  Assessment & Plan:  Acute on chronic hypoxemic respiratory failure  Kyphoscoliosis OHS/OSA Tracheobronchitis leading to Hemoptysis:  Will trial off vent overnight.  - Continue prophylactic heparin  Acute on chronic combined CHF Permanent atrial fibrillation Hyperlipidemia - Tele - Amiodarone - Metoprolol - Lasix - Lipitor - Heparin   History of CVA Dementia Seizure disorder Depression Sertraline Keppra  Severe physical deconditioning Continue nutrition efforts and physical therapy consultation  DM2 SSI + glargine  CKD 3a Hypokalemia Monitor BMET and UOP Replace electrolytes as  needed  Anemia of chronic disease Monitor for bleeding Transfuse PRBC for Hgb < 7 gm/dL  Hypothyroidism synthroid  Dysphagia S/P PEG Tube feeding Free water  Loose stool Imodium  Disposition: needs home vent. Family says that home generator won't be ready until 8/18.  Move to [facility], PCCM will follow.  Will see if can liberate from nightly vent while she is still here.   Best Practice (right click and "Reselect all SmartList Selections" daily)   Diet/type: tubefeeds DVT prophylaxis: systemic heparin GI prophylaxis: PPI Lines: Central line  Foley:  N/A Code Status:  full code Last date of multidisciplinary goals of care discussion [7/28 family confirms they want the full scope of practice]  Dolan Amen, MD IMTS, PGY-3 Pager: [phone] 02/02/2021,9:49 AM

## 2021-02-02 NOTE — Progress Notes (Signed)
Placed patient on full support ventilation due to increased RR, labored, diaphoretic.

## 2021-02-02 NOTE — Progress Notes (Signed)
[facility] PROGRESS NOTE  [name]:[id number] DOB: [birth year] DOA: 01/03/2021 PCP: Lemmie Evens, MD   Status: Remains inpatient appropriate because:Unsafe d/c plan, IV treatments appropriate due to intensity of illness or inability to take PO, and Inpatient level of care appropriate due to severity of illness  Dispo: The patient is from: Home              Anticipated d/c is to: Home once generator for electrical back up for nocturnal vent installed              Patient currently is not medically stable to d/c.   Difficult to place patient No    Level of care: ICU 2/2 friable pulmonary mucosa/new trach/requirement for nocturnal ventilator  Code Status: Full Family Communication: Daughter at bedside on 8/2 DVT prophylaxis: Subcu heparin COVID vaccination status: Not documented  HPI: 70 yo female who presented to [facility] on 7/03 with dyspnea, and altered mental status. Had admission from 12/21/20 to 12/30/20 for respiratory failure and CHF exacerbation.Transferred to [facility]. SpO2 65% and elevated PCO2.  Failed Bipap and required intubation. Course since then complicated by prolonged mechanical ventilation requiring tracheostomy 7/7. She has been managed by PCCM for 4 weeks, deemed to require home vent which is delayed by need for generator at home. This is being arranged. She was transferred to hospitalist service on 02/01/2021.   Significant events: 7/3  chronic HCRF from OHS and restrictive lung dz as well as deconditioning-->progression -->decompensated HFpEF and cor pulmonale-->worsening metabolic enceph from HCRF-->PNA --> acute on chronic resp failure. Admitted. Intubated. Ceftriaxone 7/3 x 1. Azith and Cefepime started. 7/4-7/5 Hypotension overnight. A line placed. 540m LR bolus. [place] Hgb 6.7 (7.4), CT A/P completed to r/o RP bleed (negative), showed mild peripancreatic stranding. Lipase slightly elevated. 1U PRBCs ordered. 7/7 Tracheostomy  placed, heparin gtt held for procedure. 7/8 Increased agitation with Propofol wean, increased HR, uptrending pressor requirements. Cortrak placed. Azith and cefepime stopped. 7/11 PEG tube placed, tolerating PSV 7/12 ATC trials started. Tubefeeds started/resumed 7/13 solucortef decreased to daily. Midodrine stopped. Heparin gtt stopped. Stated back on DOAC. PANDA removed 7/14: sat at side of bed w/ max assist for 15 minutes. Fatigued quickly. PT recommending LTAC setting 7/15: Tolerated trach collar yesterday 7/16: TCT x 2 hours 7/18 hgb 6.8 received 1 unit PRBC. LTACH appeal denied 7/22 Tolerating brief periods of trach collar and PMV, Periods of emesis >> TFs held, Bright red blood noted by RT 7/23 left atelectasis, bronchoscopy showing inspissated clot occluding distal trachea, needed cryo probe to remove clot down to left mainstem 7/20 4 repeat bronchoscopy with cryoprobe to remove residual clot left upper lobe and lower lobe, oozing of underlying mucosa, distal clot remains 7/23 atx due to clot. Trach replaced with ETT for purpose of bronchoscopy. Inspissated clot suctioned. No obvious proximal bleeding source identified. Required TXA nebs and Cryoprobe 7/25 still w/ bloody trach secretions. Tolerating CPAP 7/26 hgb down 6.9 1 u PRBC transfused; complete white out of left hemithorax (again). Underwent bronch w/ cryoprobe for removal of clots. large clot in the left lower lobe as well as friable mucosa throughout the left tracheobronchial tree. Low grade temp. Concern for possible VAP/HCAP so sputum sent and Cefepime started. 7/27: Trach placement 7/29 tolerating PSV  7/30 ATC  Subjective: Awake and alert.  PMV not in place so unable to phonate but nodding appropriately and smiling.  Daughter at bedside.  Objective: Vitals:   02/02/21 0500 02/02/21  0600  BP: (!) 94/59 (!) 103/57  Pulse: 60 64  Resp: (!) 3 (!) 22  Temp:    SpO2: 96% 97%    Intake/Output Summary (Last 24 hours) at  02/02/2021 0749 Last data filed at 02/02/2021 0600 Gross per 24 hour  Intake 1410 ml  Output 900 ml  Net 510 ml   Filed Weights   01/31/21 0500 02/01/21 0500 02/02/21 0500  Weight: 97.6 kg 94.6 kg 94.6 kg    Exam:  Constitutional: NAD, calm, comfortable Respiratory: Midline tracheostomy  #8.0 cuffed; FiO2 35%, normal respiratory effort. No accessory muscle use.  Wet productive cough. Cardiovascular: Regular rate and rhythm, no murmurs / rubs / gallops. No extremity edema. 2+ pedal pulses.  Resting tachycardia Abdomen: no tenderness, no masses palpated.  Tube in place for bolus feeds.  Insertion site unremarkable bowel sounds positive.  Skin: no rashes, lesions-has documented stage I sacral pressure injury as well as lower right arm skin tear and groin MASD Neurologic: CN 2-12 grossly intact. Sensation intact, DTR normal. Strength 5/5 x all 4 extremities.  Psychiatric:  Alert and appears to be oriented x 3 but unable to accurately assessed given inability to phonate during my exam. Normal mood.  Laughs appropriately and attempts to verbally communicate   Assessment/Plan: Acute problems:  Acute on chronic hypoxemic and hypercarbic respiratory failure: Multifactorial including restrictive lung disease due to kyphoscoliosis, OHS/OSA, tracheobronchitis leading to hemoptysis. - Per PCCM -Plan is for nocturnal ventilator at discharge -Daughter at bedside today and RN is instructing in suctioning of tracheostomy tube   Acute on chronic combined CHF LV systolic dysfunction EF 45 to 50% Moderate RV systolic dysfunction Severe pulmonary hypertension 60 mmHg Mild to moderate MR and moderate TR:  -Volume status improved.  Exacerbated by underlying hypoalbuminemia -SBP in the 90s to beta-blocker decreased from 25 to 12.5 mg -Not on ARB/ACE inhibitor secondary to suboptimal blood pressure reading -Continue Lasix 20 mg IV every 12 hours -Renal function normal with mildly elevated BUN -Remains in  a positive balance since admission-continue strict I's/O- note past 24 hours 900 cc of urine with 1 unmeasured void  Permanent atrial fibrillation - Continue metoprolol, amiodarone -Resting tachycardia likely related to deconditioning -Full dose anticoagulation on hold due to anemia, hemoptysis requiring bronch which showed friable mucosa. (Preadmission was on Eliquis)  Diabetes mellitus 2 on long-term insulin; controlled -A1c 7.6% -Has been receiving scheduled NovoLog 10 units every 4 hours-discontinue -Increase Lantus to 20 units twice daily -Continue SSI  Severe physical deconditioning: -Not candidate for LTAC given chronicity of debility -Plan is to discharge home -Continue PT and OT -CM familiar with patient and will confirm that patient still has hospital bed available.  She will also need [employer] fast streamer and [employer].  In addition she should qualify for frequent RN visits as well as RRT follow-up  Dysphagia s/p PEG: - Continue bolus tube feeds, free water.   History of CVA - Continue statin   Dementia: - Delirium precautions   Seizure disorder, depression - Continue sertraline, keppra   Stage IIIa CKD:  -Currently better than that diagnosis would suggest. - Avoid nephrotoxins -Follow creatinine closely with IV diuresis -current eGFR >60   Acute hypokalemia: - Resolved   Anemia of chronic disease exacerbated by acute blood loss anemia due to hemoptysis (improving):  -Hgb stable at 8.8-possibly a degree of hemodilution from acute heart failure - Monitor intermittently-no further hemoptysis noted   Hypothyroidism:  -TSH 0.697 - Continue synthroid   Loose stool:  -  Likely related to tube feeds with benign exam. - Continue rectal tube for now-100 cc out in the past 24-hour - Continue imodium with low suspicion for GI pathogen.  -Add bulking agent   Data Reviewed: Basic Metabolic Panel: Recent Labs  Lab 01/28/21 0222 01/29/21 0236 01/30/21 0314  02/01/21 0336  NA 136 137 138 139  K 3.6 3.2* 4.1 4.2  CL 104 102 107 105  CO2 _0 GLUCOSE 117* 150* 147* 140*  BUN 30* 29* 29* 31*  CREATININE 0.96 0.83 0.91 0.81  CALCIUM 8.0* 8.0* 8.2* 8.5*   Liver Function Tests: Recent Labs  Lab 01/28/21 0222 02/01/21 0336  AST 16 17  ALT 12 14  ALKPHOS 54 55  BILITOT 0.5 0.5  PROT 5.7* 5.7*  ALBUMIN 1.7* 1.9*   No results for input(s): LIPASE, AMYLASE in the last 168 hours. No results for input(s): AMMONIA in the last 168 hours. CBC: Recent Labs  Lab 01/26/21 1140 01/26/21 2335 01/28/21 0217 01/29/21 0236 02/01/21 0336  WBC  --  12.4* 9.7 7.7 6.6  NEUTROABS  --   --   --   --  3.9  HGB 8.3* 8.0* 7.7* 7.8* 8.0*  HCT 27.6* 26.0* 25.6* 25.3* 26.4*  MCV  --  94.2 94.1 94.4 93.6  PLT  --  172 177 179 210   Cardiac Enzymes: No results for input(s): CKTOTAL, CKMB, CKMBINDEX, TROPONINI in the last 168 hours. BNP (last 3 results) Recent Labs    11/21/20 1656 12/21/20 0953 01/03/21 1527  BNP 425.0* 453.0* 614.0*    ProBNP (last 3 results) No results for input(s): PROBNP in the last 8760 hours.  CBG: Recent Labs  Lab 02/01/21 1116 02/01/21 1526 02/01/21 1957 02/01/21 2354 02/02/21 0401  GLUCAP 166* 152* 187* 162* 122*    Recent Results (from the past 240 hour(s))  Culture, Respiratory w Gram Stain     Status: None   Collection Time: 01/26/21  9:06 PM   Specimen: Tracheal Aspirate; Respiratory  Result Value Ref Range Status   Specimen Description TRACHEAL ASPIRATE  Final   Special Requests NONE  Final   Gram Stain   Final    FEW WBC PRESENT, PREDOMINANTLY PMN FEW GRAM POSITIVE RODS RARE GRAM POSITIVE COCCI IN PAIRS Performed at [facility], [place] [address], [place], [place] [address]    Culture MODERATE ENTEROBACTER CLOACAE  Final   Report Status 01/29/2021 FINAL  Final   Organism ID, Bacteria ENTEROBACTER CLOACAE  Final      Susceptibility   Enterobacter cloacae - MIC*    CEFAZOLIN >=64  RESISTANT Resistant     CEFEPIME <=0.12 SENSITIVE Sensitive     CEFTAZIDIME <=1 SENSITIVE Sensitive     CIPROFLOXACIN <=0.25 SENSITIVE Sensitive     GENTAMICIN <=1 SENSITIVE Sensitive     IMIPENEM 0.5 SENSITIVE Sensitive     TRIMETH/SULFA <=20 SENSITIVE Sensitive     PIP/TAZO <=4 SENSITIVE Sensitive     * MODERATE ENTEROBACTER CLOACAE     Scheduled Meds:  amiodarone  200 mg Per Tube Daily   atorvastatin  80 mg Per Tube Daily   chlorhexidine gluconate (MEDLINE KIT)  15 mL Mouth Rinse BID   Chlorhexidine Gluconate Cloth  6 each Topical Q0600   feeding supplement (KATE FARMS STANDARD 1.4)  325 mL Per Tube QID   feeding supplement (PROSource TF)  45 mL Per Tube TID   free water  200 mL Per Tube Q6H   furosemide  20  mg Intravenous BID   gabapentin  100 mg Per Tube Q8H   heparin injection (subcutaneous)  5,000 Units Subcutaneous Q8H   insulin aspart  0-15 Units Subcutaneous Q4H   insulin aspart  10 Units Subcutaneous QID   insulin glargine  10 Units Subcutaneous Daily   levETIRAcetam  1,000 mg Per Tube BID   levothyroxine  175 mcg Per Tube Q0600   mouth rinse  15 mL Mouth Rinse 10 times per day   metoprolol tartrate  12.5 mg Per Tube BID   pantoprazole sodium  40 mg Per Tube Daily   sertraline  25 mg Per Tube Daily     Active Problems:   Acute respiratory failure with hypoxia and hypercapnia (HCC)   Acute on chronic respiratory failure with hypoxia and hypercapnia (HCC)   Hypercapnic respiratory failure (HCC)   Status post tracheostomy ([place])   Encephalopathy acute   Hemoptysis   Consultants: PCCM Interventional radiology Video-assisted bronchoscopy  Procedures: Echocardiogram Tracheostomy tube PEG tube  Antibiotics: Cefepime 7/3 through 7/8 Azithromycin 7/3 through 7/7 Vancomycin 7/3, 7/4 Cefazolin 7/11 x 1 dose Cefepime 7/27 through 7/31   Time spent: 35 minutes    Erin Hearing ANP  [facility] 7 am - 330 pm/M-F for direct patient care and secure  chat Please refer to Amion for contact info 30  days

## 2021-02-02 NOTE — Progress Notes (Signed)
RT placed pt back on full ventilatory support. Pt appears pale, diaphoretic w/ increase WOB. Per order MD would like to keep pt on ATC during the night. Will attempt later this evening. RN notified.

## 2021-02-02 NOTE — Progress Notes (Signed)
Placed patient on A35% ATC for overnight trial. RT will continue to monitor.

## 2021-02-03 ENCOUNTER — Inpatient Hospital Stay (HOSPITAL_COMMUNITY): Payer: PPO

## 2021-02-03 ENCOUNTER — Encounter (HOSPITAL_COMMUNITY): Payer: Self-pay | Admitting: Internal Medicine

## 2021-02-03 DIAGNOSIS — Z9911 Dependence on respirator [ventilator] status: Secondary | ICD-10-CM

## 2021-02-03 DIAGNOSIS — J9601 Acute respiratory failure with hypoxia: Secondary | ICD-10-CM | POA: Diagnosis not present

## 2021-02-03 LAB — CBC
HCT: 27.1 % — ABNORMAL LOW (ref 36.0–46.0)
Hemoglobin: 8.2 g/dL — ABNORMAL LOW (ref 12.0–15.0)
MCH: 28.3 pg (ref 26.0–34.0)
MCHC: 30.3 g/dL (ref 30.0–36.0)
MCV: 93.4 fL (ref 80.0–100.0)
Platelets: 246 10*3/uL (ref 150–400)
RBC: 2.9 MIL/uL — ABNORMAL LOW (ref 3.87–5.11)
RDW: 17.7 % — ABNORMAL HIGH (ref 11.5–15.5)
WBC: 8.3 10*3/uL (ref 4.0–10.5)
nRBC: 0 % (ref 0.0–0.2)

## 2021-02-03 LAB — GLUCOSE, CAPILLARY
Glucose-Capillary: 157 mg/dL — ABNORMAL HIGH (ref 70–99)
Glucose-Capillary: 89 mg/dL (ref 70–99)
Glucose-Capillary: 186 mg/dL — ABNORMAL HIGH (ref 70–99)
Glucose-Capillary: 216 mg/dL — ABNORMAL HIGH (ref 70–99)
Glucose-Capillary: 216 mg/dL — ABNORMAL HIGH (ref 70–99)
Glucose-Capillary: 202 mg/dL — ABNORMAL HIGH (ref 70–99)

## 2021-02-03 MED ORDER — DULOXETINE HCL 30 MG PO CPEP
30.0000 mg | ORAL_CAPSULE | Freq: Two times a day (BID) | ORAL | Status: DC
  Administered 2021-02-03 – 2021-02-15 (×25): 30 mg via ORAL
  Filled 2021-02-03 (×27): qty 1

## 2021-02-03 MED ORDER — TRANEXAMIC ACID FOR INHALATION
500.0000 mg | Freq: Two times a day (BID) | RESPIRATORY_TRACT | Status: DC
  Filled 2021-02-03: qty 10

## 2021-02-03 MED ORDER — TRANEXAMIC ACID FOR INHALATION
500.0000 mg | Freq: Two times a day (BID) | RESPIRATORY_TRACT | Status: AC
  Administered 2021-02-03 – 2021-02-04 (×4): 500 mg via RESPIRATORY_TRACT
  Filled 2021-02-03 (×4): qty 10

## 2021-02-03 NOTE — Progress Notes (Signed)
NAME:  [name], MRN:  [id number], DOB:  Sep 28, 1950, LOS: 31 ADMISSION DATE:  01/03/2021, CONSULTATION DATE:  7/3 REFERRING MD:  EDP/APH, CHIEF COMPLAINT:  dyspnea   History of Present Illness:  70 yo female presented to [facility] on 7/03 with dyspnea, and altered mental status.    Had admission from 12/21/20 to 12/30/20 for respiratory failure and CHF exacerbation.Transferred to [facility]. SpO2 65% and elevated PCO2.  Failed Bipap and required intubation. Course since then complicated by prolonged mechanical ventilation requiring tracheostomy 7/7  Pertinent  Medical History  Kyphoscoliosis, Restrictive lung disease, Chronic respiratory failure on 2 liters, DM type 2, Chronic a fib on eliquis, Dementia, Chronic combined CHF, Hypothyroidism, Seizures, CKD 3a, CAD s/p NSTEMI, HLD, GERD  Significant Hospital Events: Including procedures, antibiotic start and stop dates in addition to other pertinent events   7/3  chronic HCRF from OHS and restrictive lung dz as well as deconditioning-->progression -->decompensated HFpEF and cor pulmonale-->worsening metabolic enceph from HCRF-->PNA --> acute on chronic resp failure. Admitted. Intubated. Ceftriaxone 7/3 x 1. Azith and Cefepime started. 7/4-7/5 Hypotension overnight. A line placed. LR bolus. Started on NE. RUE PICC placed 7/6 Hgb 6.7 (7.4), CT A/P completed to r/o RP bleed (negative), showed mild peripancreatic stranding. Lipase slightly elevated. 1U PRBCs ordered. 7/7 Tracheostomy placed, heparin gtt held for procedure. 7/8 Increased agitation with Propofol wean, increased HR, uptrending pressor requirements. Cortrak placed. Azith and cefepime stopped. 7/11 PEG tube placed, tolerating PSV 7/12 ATC trials started. Tubefeeds started/resumed 7/13 solucortef decreased to daily. Midodrine stopped. Heparin gtt stopped. Stated back on DOAC. PANDA removed 7/14: sat at side of bed w/ max assist for 15 minutes. Fatigued quickly. PT recommending LTAC  setting 7/15: Tolerated trach collar yesterday 7/16: TCT x 2 hours 7/18 hgb 6.8 received 1 unit PRBC. LTACH appeal denied 7/22 Tolerating brief periods of trach collar and PMV, Periods of emesis >> TFs held, Bright red blood noted by RT 7/23 left atelectasis, bronchoscopy showing inspissated clot occluding distal trachea, needed cryo probe to remove clot down to left mainstem 7/20 4 repeat bronchoscopy with cryoprobe to remove residual clot left upper lobe and lower lobe, oozing of underlying mucosa, distal clot remains 7/23 atx due to clot. Trach replaced with ETT for purpose of bronchoscopy. Inspissated clot suctioned. No obvious proximal bleeding source identified. Required TXA nebs and Cryoprobe 7/25 still w/ bloody trach secretions. Tolerating CPAP 7/26 hgb down 6.9 1 u PRBC transfused; complete white out of left hemithorax (again). Underwent bronch w/ cryoprobe for removal of clots. large clot in the left lower lobe as well as friable mucosa throughout the left tracheobronchial tree. Low grade temp. Concern for possible VAP/HCAP so sputum sent and Cefepime started. 7/27: Trach placement 7/29 tolerating PSV  7/30 ATC 8/2 Did not tolerate O/N ATC  Interim History / Subjective:   O/N Events: Did not tolerate ATC, placed back on [facility] Patient seen today on rounds. She states that she feels well, slept well overnight. Has no complaints. Updated daughter at bedside.   Objective   Blood pressure 110/76, pulse 66, temperature 98.2 F (36.8 C), temperature source Oral, resp. rate (!) 22, height 5\' 8"  (1.727 m), weight 94.6 kg, SpO2 100 %.    Vent Mode: PRVC FiO2 (%):  [30 %-35 %] 30 % Set Rate:  [22 bmp] 22 bmp Vt Set:  [510 mL] 510 mL PEEP:  [5 cmH20] 5 cmH20 Plateau Pressure:  [17 cmH20-21 cmH20] 17 cmH20   Intake/Output Summary (Last 24  hours) at 02/03/2021 0935 Last data filed at 02/03/2021 0700 Gross per 24 hour  Intake 2150 ml  Output 2550 ml  Net -400 ml   Filed Weights    01/31/21 0500 02/01/21 0500 02/02/21 0500  Weight: 97.6 kg 94.6 kg 94.6 kg    Examination:  Physical Exam Constitutional:      General: She is not in acute distress.    Appearance: She is not ill-appearing or toxic-appearing.     Comments: Answers questions appropriately, NAD  Cardiovascular:     Rate and Rhythm: Normal rate.     Heart sounds: Normal heart sounds. No murmur heard.   No gallop.  Pulmonary:     Effort: No respiratory distress.  Abdominal:     General: Bowel sounds are normal.     Tenderness: There is no abdominal tenderness. There is no guarding.  Neurological:     Mental Status: She is alert.     Resolved Hospital Problem list   Septic shock from pneumonia Left upper extremity redness and swelling resolved Relative adrenal insufficiency -Weaned off steroids 7/15 Acute metabolic encephalopathy 2nd to sepsis and hypercapnia - improved Hemoptysis w/ resultant atelectasis/lung collapse. Required trach removal and oral intubation 7/23 to facilitate bronchoscopy and cryoprobe. Ileus (7/22) Hemoptysis with resultant lung collapse s/p cryoprobe again  Leukocytosis Enterobacter Pneumonia  Assessment & Plan:  Acute on chronic hypoxemic respiratory failure  Kyphoscoliosis OHS/OSA Tracheobronchitis leading to Hemoptysis:  Did not tolerate ATC O/N on 8/2.  Will need home home generator and vent.   Acute on chronic combined CHF Permanent atrial fibrillation Hyperlipidemia - Tele  - Amiodarone 200 mg daily  - Metoprolol 12.5 mg BID - Lasix 20 mg BID - Lipitor 80 mg daily  - Heparin   History of CVA Dementia Seizure disorder Depression Sertraline 25 mg daily  Keppra 1000 mL  BID  Severe Physical Deconditioning Continue nutrition efforts and physical therapy consultation  DM2 SSI + glargine 20 U BID  CKD 3a Hypokalemia Monitor BMET and UOP, net negative 2L since admission Replace electrolytes as needed  Anemia of chronic disease Monitor for  bleeding Transfuse PRBC for Hgb < 7 gm/dL  Hypothyroidism Synthroid 175 mcg @0600   Dysphagia S/P PEG Tube feeding Free water  Loose stool Imodium  Disposition: needs home vent. Home generator won't be ready until 8/18.  Move to [facility], PCCM will follow.  Will see if can liberate from nightly vent while she is still here.   Best Practice (right click and "Reselect all SmartList Selections" daily)   Diet/type: tubefeeds DVT prophylaxis: systemic heparin GI prophylaxis: PPI Lines: Central line  Foley:  N/A Code Status:  full code Last date of multidisciplinary goals of care discussion [7/28 family confirms they want the full scope of practice]  11-25-1976, MD IMTS, PGY-3 Pager: [phone] 02/03/2021,9:35 AM

## 2021-02-03 NOTE — TOC Progression Note (Signed)
Transition of Care [facility]) - Progression Note    Patient Details  Name: [name] MRN: [id number] Date of Birth: [birth year]  Transition of Care [facility]) CM/SW Contact  Janae Bridgeman, RN Phone Number: 02/03/2021, 12:16 PM  Clinical Narrative:    Case management spoke with the patient's daughter at the bedside and explained that I spoke with [facility] [facility] provider, Dr. Logan Bores and he is recommending the patient transfer to a Ventilator Skilled nursing facility for a safe discharge plan at this time, since the patient is requiring ventilator support and care at this time.  I explained to the daughter that the family would be unable to provide safe care at home at this time for ventilatory management.  The daughter, [name] was in agreement to explore placement options at this time - including [facility], and [facility].  Please see Jacqulyn Bath [facility] note regarding exploration for Ventilator SNf beds.  I spoke with the daughter and explained that if the patient can safely go home at some point with the tracheostomy and home health that she will need to provide nursing support private pay for atleast 8 hours a day in order to provide safe care for the patient and respite care for the family while they are in the home.    The daughter is in agreement to have palliative care follow the patient to the SNF and also for home if patient is able to discharge home safely.  I called and spoke with Elonda Husky, RNCM with [facility] and they will follow the patient for outpatient palliative care support.  CM and MSW will continue to follow the patient for Vent. SNF placement and family is in agreement to explore this option at this time.   Expected Discharge Plan: Home w Home Health Services Barriers to Discharge: Continued Medical Work up  Expected Discharge Plan and Services Expected Discharge Plan: Home w Home Health Services In-house  Referral: Clinical Social Work Discharge Planning Services: CM Consult Post Acute Care Choice: Durable Medical Equipment, Resumption of Svcs/PTA Provider Living arrangements for the past 2 months: Single Family Home                   DME Agency: [employer] Date DME Agency Contacted: 02/02/21 Time DME Agency Contacted: 1129 Representative spoke with at DME Agency: Called and left a message with Jenness Corner, CM with [employer] to discuss pending equipment needs for home.     Date HH Agency Contacted: 02/02/21 Time HH Agency Contacted: 1133 Representative spoke with at [facility] Agency: Kandee Keen, [facility] with [place], left message with [facility] - [phone]   Social Determinants of Health (SDOH) Interventions    Readmission Risk Interventions Readmission Risk Prevention Plan 12/28/2020 11/24/2020 09/21/2020  Transportation Screening Complete Complete Complete  HRI or Home Care Consult - Complete -  Social Work Consult for Recovery Care Planning/Counseling - Complete -  Palliative Care Screening - Not Applicable -  Medication Review [employer]) Complete Complete Complete  PCP or Specialist appointment within 3-5 days of discharge - - Complete  HRI or Home Care Consult Complete - Complete  SW Recovery Care/Counseling Consult Complete - Complete  Palliative Care Screening (No Data) - Complete  Skilled Nursing Facility Patient Refused - Complete  Some recent data might be hidden

## 2021-02-03 NOTE — Progress Notes (Signed)
Nutrition Follow-up  DOCUMENTATION CODES:   Obesity unspecified  INTERVENTION:   Continue bolus TF via PEG: Dillard [place] Standard 1.4, 325 ml 4 times per day. Recommend bolus slowly over 15-20 minutes to promote tolerance. Prosource TF 45 ml TID  Provides 1940 kcal, 113 gm protein daily, 936 ml free water daily.  Continue free water flushes 200 ml every 6 hours for a total of 1736 ml free water daily.  NUTRITION DIAGNOSIS:   Inadequate oral intake related to inability to eat as evidenced by NPO status.  Ongoing   GOAL:   Patient will meet greater than or equal to 90% of their needs  Met with TF  MONITOR:   Weight trends, TF tolerance, Labs, Vent status, Skin, I & O's  REASON FOR ASSESSMENT:   Consult Assessment of nutrition requirement/status, Enteral/tube feeding initiation and management  ASSESSMENT:   Pt with a PMH including T2DM, Afib, dementia, CHF, seizure disorder, CKD stage 3, CAD w/ prior NSTEMI, HTN, hypothyrodism, and dyslipidemia admitted with acute combined hypoxic and hypercapnic respiratory failure 2/2 CHF, HTN, restrictive lung disease 2/2 kyphosis. Note pt recently admitted from 6/20-6/29 for acute on chronic hypoxic hypercapnic respiratory failure 2/2 CHF exacerbations.  Discussed patient in ICU rounds and with RN today. Trach replaced 7/27.  Tolerated trach collar yesterday for several hours.  Currently on vent support.  Dispo plan to go home with daughter Bolus feedings via PEG initiated 7/12. Patient has been tolerating bolus TF without difficulty: Dillard Essex standard 1.4 325 ml QID with Prosource TF 45 ml TID. Receiving free water flushes 200 ml every 6 hours.  Patient remains intubated on ventilator support MV: 6.2 L/min Temp (24hrs), Avg:98.3 F (36.8 C), Min:97.9 F (36.6 C), Max:98.8 F (37.1 C)   Labs reviewed.  CBG: [id number]  Medications reviewed and include lasix, heparin, novolog, lantus, keppra, protonix.  Admission weight  106.1 kg Current weight 94.6 kg on 8/2 (Lowest weight since admission)   I/O +5.4 L since admission UOP 2450 ml x 24 hours   Diet Order:   Diet Order             Diet NPO time specified  Diet effective midnight                   EDUCATION NEEDS:   Not appropriate for education at this time  Skin:  Skin Assessment: Reviewed RN Assessment Other: skin tear R arm  Last BM:  8/3 type 7  Height:   Ht Readings from Last 1 Encounters:  01/03/21 5' 8" (1.727 m)    Weight:   Wt Readings from Last 1 Encounters:  02/02/21 94.6 kg    Ideal Body Weight:  63.63 kg  BMI:  Body mass index is 31.71 kg/m.  Estimated Nutritional Needs:   Kcal:  1675-1900  Protein:  110-130 gm  Fluid:  >/= 1.8 L    Lucas Mallow, RD, LDN, CNSC Please refer to Amion for contact information.

## 2021-02-03 NOTE — Progress Notes (Addendum)
CSW spoke with admissions at [facility] who states the facility does not accept [employer].  CSW spoke with Prem at [facility] who states the facility does not have any open vent beds at this time. Prem to return call to CSW if a bed becomes available.  CSW spoke with Clydie Braun at [facility] who states the facility does not have any open vent beds at this time.  Edwin Dada, MSW, LCSW Transitions of Care  Clinical Social Worker II [phone]

## 2021-02-03 NOTE — Progress Notes (Cosign Needed)
    Durable Medical Equipment  (From admission, onward)           Start     Ordered   01/29/21 1549  For home use only DME Hospital bed  Once       Question Answer Comment  Length of Need 12 Months   Patient has (list medical condition): ventilator dependent, Kyphoscoliosis, Restrictive lung disease, Chronic respiratory failure,DM type 2, Chronic a fib, Dementia, Chronic combined CHF, Hypothyroidism, Seizures, CKD 3a, CAD s/p NSTEMI, HLD, GERD   The above medical condition requires: Patient requires the ability to reposition frequently   Head must be elevated greater than: 30 degrees   Bed type Semi-electric   Hoyer Lift Yes   Support Surface: Gel Overlay      01/29/21 1551   Unscheduled  For home use only DME Hospital bed  Once       Question Answer Comment  Length of Need Lifetime   Patient has (list medical condition): CHF, respiratory failure, tracheostomy and ventilatory support   The above medical condition requires: Patient requires the ability to reposition frequently   Head must be elevated greater than: 30 degrees   Bed type Semi-electric   Support Surface: Gel Overlay      02/03/21 0817

## 2021-02-03 NOTE — TOC Progression Note (Addendum)
Transition of Care (TOC) - Progression Note    Patient Details  Name: [name] MRN: [id number] Date of Birth: 06/02/1951  Transition of Care (TOC) CM/SW Contact   R Stubbldfield, RN Phone Number: 02/03/2021, 8:01 AM  Clinical Narrative:    Case management called and left a message with [facility] for home health services for home.  CM will follow up with [employer] and speak with Amy, RNCM to assist with finding home health services.  CM left a message with [facility] as well.   The patient is requiring increased ventilatory support at this time and is not medically stable for discharge per Allison Ellis, NP.  LTAC may have to be reconsidered if patient is requiring increased ventilatory support.  I left a message with Jessica Wright, CM with financial counseling to assist with Medicaid screening.  Zack, CM with [employer] met with the patient's daughter to order an electric bed for the home to replace the manuel bed the family received at at earlier date from [facility] dme.  CM and MSW will continue to follow the patient for discharge needs.  02/03/2021 0906 - I spoke with Amy, RNCM with [facility] and she is forwarding the patient's information to the nursing director for review since the patient has complex health needs at this time.  I called and spoke with Lisa Turley, RNCM Supervisor with [facility] [phone] to assist and discuss discharging planning for this patient.  At this time, [facility] is review the patient clinicals for home health services.  [facility] contact information was given to the patient's daughter yesterday for CNA support at home through private pay resources.  02/03/2021 1027-  CM called and spoke with Amy, RNCM with [facility] and they are willing to accept the patient for care for home contingent upon approved home health visits through [employer] insurance company.  The patient has complex healthcare  needs for home and the agency wants to provide sufficient care to the family post hospitalization to include needed RN, HH aide, PT and MSW needed at this time.  RT support is provided through [employer] with 24 hour support line and home visits.  I called and spoke with Lisa Turley, RNCM supervisor with [facility] [phone] and advised her of the needed care at this time and have her to follow up with Elizabeth Moore, RN at [facility] [phone] coordinate prior approval for home health visits.  At this time the patient is requiring ventilatory support and bleeding from tracheostomy site per Allison Ellis, NP.  I spoke with Lisa Turley, RNCM with [facility] and asked for the opportunity for patient to transition to [facility] as a bridge first before going home.  Turley, RNCM is going to follow up with Dr. Evans, Medical Director for suggested safe transitions of care needs.  CM and MSW will continue to follow the patient for TOC needs.   Expected Discharge Plan: Home w Home Health Services Barriers to Discharge: Continued Medical Work up  Expected Discharge Plan and Services Expected Discharge Plan: Home w Home Health Services In-house Referral: Clinical Social Work Discharge Planning Services: CM Consult Post Acute Care Choice: Durable Medical Equipment, Resumption of Svcs/PTA Provider Living arrangements for the past 2 months: Single Family Home                   DME Agency: [employer] Date DME Agency Contacted: 02/02/21 Time DME Agency Contacted: 1129 Representative spoke with at DME   Agency: Called and left a message with Zack Blank, CM with [employer] to discuss pending equipment needs for home.     Date HH Agency Contacted: 02/02/21 Time HH Agency Contacted: 1133 Representative spoke with at HH Agency: Cory, RNCM with [facility], left message with [facility] - [phone]   Social Determinants of Health (SDOH) Interventions    Readmission Risk Interventions Readmission Risk  Prevention Plan 12/28/2020 11/24/2020 09/21/2020  Transportation Screening Complete Complete Complete  HRI or Home Care Consult - Complete -  Social Work Consult for Recovery Care Planning/Counseling - Complete -  Palliative Care Screening - Not Applicable -  Medication Review (RN Care Manager) Complete Complete Complete  PCP or Specialist appointment within 3-5 days of discharge - - Complete  HRI or Home Care Consult Complete - Complete  SW Recovery Care/Counseling Consult Complete - Complete  Palliative Care Screening (No Data) - Complete  Skilled Nursing Facility Patient Refused - Complete  Some recent data might be hidden    

## 2021-02-03 NOTE — Progress Notes (Addendum)
[facility] PROGRESS NOTE  [name] BMW:[id number] DOB: 09-Jun-1951 DOA: 01/03/2021 PCP: Lemmie Evens, MD   Status: Remains inpatient appropriate because:Unsafe d/c plan, IV treatments appropriate due to intensity of illness or inability to take PO, and Inpatient level of care appropriate due to severity of illness  Dispo: The patient is from: Home              Anticipated d/c is to: Home once generator for electrical back up for nocturnal vent installed; also [facility] will not accept trach < 30 days post insertion; also needs to prove can tolerate Eliquis w/o bleeding as well              Patient currently is not medically stable to d/c.   Difficult to place patient No    Level of care: ICU 2/2 friable pulmonary mucosa/new trach/requirement for nocturnal ventilator  Code Status: Full Family Communication: Daughter at bedside on 8/2 DVT prophylaxis: Subcu heparin COVID vaccination status: Not documented  HPI: 70 yo female who presented to [facility] on 7/03 with dyspnea, and altered mental status. Had admission from 12/21/20 to 12/30/20 for respiratory failure and CHF exacerbation.Transferred to [facility]. SpO2 65% and elevated PCO2.  Failed Bipap and required intubation. Course since then complicated by prolonged mechanical ventilation requiring tracheostomy 7/7. She has been managed by PCCM for 4 weeks, deemed to require home vent which is delayed by need for generator at home. This is being arranged. She was transferred to hospitalist service on 02/01/2021.   Significant events: 7/3  chronic HCRF from OHS and restrictive lung dz as well as deconditioning-->progression -->decompensated HFpEF and cor pulmonale-->worsening metabolic enceph from HCRF-->PNA --> acute on chronic resp failure. Admitted. Intubated. Ceftriaxone 7/3 x 1. Azith and Cefepime started. 7/4-7/5 Hypotension overnight. A line placed. 529m LR bolus. Started on [place] 7/6 Hgb 6.7 (7.4), CT A/P completed to r/o RP  bleed (negative), showed mild peripancreatic stranding. Lipase slightly elevated. 1U PRBCs ordered. 7/7 Tracheostomy placed, heparin gtt held for procedure. 7/8 Increased agitation with Propofol wean, increased HR, uptrending pressor requirements. Cortrak placed. Azith and cefepime stopped. 7/11 PEG tube placed, tolerating PSV 7/12 ATC trials started. Tubefeeds started/resumed 7/13 solucortef decreased to daily. Midodrine stopped. Heparin gtt stopped. Stated back on DOAC. PANDA removed 7/14: sat at side of bed w/ max assist for 15 minutes. Fatigued quickly. PT recommending LTAC setting 7/15: Tolerated trach collar yesterday 7/16: TCT x 2 hours 7/18 hgb 6.8 received 1 unit PRBC. LTACH appeal denied 7/22 Tolerating brief periods of trach collar and PMV, Periods of emesis >> TFs held, Bright red blood noted by RT 7/23 left atelectasis, bronchoscopy showing inspissated clot occluding distal trachea, needed cryo probe to remove clot down to left mainstem 7/20 4 repeat bronchoscopy with cryoprobe to remove residual clot left upper lobe and lower lobe, oozing of underlying mucosa, distal clot remains 7/23 atx due to clot. Trach replaced with ETT for purpose of bronchoscopy. Inspissated clot suctioned. No obvious proximal bleeding source identified. Required TXA nebs and Cryoprobe 7/25 still w/ bloody trach secretions. Tolerating CPAP 7/26 hgb down 6.9 1 u PRBC transfused; complete white out of left hemithorax (again). Underwent bronch w/ cryoprobe for removal of clots. large clot in the left lower lobe as well as friable mucosa throughout the left tracheobronchial tree. Low grade temp. Concern for possible VAP/HCAP so sputum sent and Cefepime started. 7/27: Trach placement 7/29 tolerating PSV  7/30 ATC alt w/ nocturnal vent 8/2 increased WOB and has  required more frequent vent support in PRVC mode  Subjective: Awake and alert.  Working with therapy at bedside.  Using the STEDY device to get patient  out of bed to chair.  Patient's daughter at bedside.  Objective: Vitals:   02/03/21 0600 02/03/21 0746  BP: (!) 97/57   Pulse: 64   Resp: (!) 21   Temp:  98.2 F (36.8 C)  SpO2: 100%     Intake/Output Summary (Last 24 hours) at 02/03/2021 0758 Last data filed at 02/03/2021 0700 Gross per 24 hour  Intake 2150 ml  Output 2550 ml  Net -400 ml   Filed Weights   01/31/21 0500 02/01/21 0500 02/02/21 0500  Weight: 97.6 kg 94.6 kg 94.6 kg    Exam:  Constitutional: Alert, appears chronically ill, no acute distress. Respiratory: Midline tracheostomy  #8.0 cuffed; on full ventilatory support: VC mode FiO2 30 %, rate 22, VT 510, PEEP 5, 70 O2 sats dropped as low as 79% but quickly rebounded.  Blood tinged secretions in ventilator tubing Cardiovascular: S1-S2, resting and exertional tachycardia noted, normotensive with SBP greater than 100.  No peripheral edema. Abdomen: PEG for bolus feeds-abdomen soft with normoactive bowel sounds. Skin: documented stage I sacral pressure injury as well as lower right arm skin tear and groin MASD Neurologic: CN 2-12 grossly intact. Sensation intact, DTR normal. Strength 5/5 x all 4 extremities.  Psychiatric: Alert and attempting to verbally communicate.  She did say I love you to to her daughter.  Appears to be oriented   Assessment/Plan: Acute problems:  Acute on chronic hypoxemic and hypercarbic respiratory failure: Multifactorial including restrictive lung disease due to kyphoscoliosis, OHS/OSA, tracheobronchitis leading to hemoptysis. - Per PCCM -Plan was for nocturnal ventilator at discharge-initially rec for replacement of nocturnal BiPAP. Currently pt requiring int daytime vent support in PRVC mode. Needs to tolerate daytime TC consistently befire can DC. -Increased WOB yesterday   Acute on chronic combined CHF LV systolic dysfunction EF 45 to 50% Moderate RV systolic dysfunction Severe pulmonary hypertension 60 mmHg Mild to moderate MR and  moderate TR:  -Volume status improved.  Exacerbated by underlying hypoalbuminemia -SBP in the 90s to beta-blocker decreased from 25 to 12.5 mg -Not on ARB/ACE inhibitor secondary to suboptimal blood pressure reading -Continue Lasix 20 mg IV every 12 hours -Renal function normal with mildly elevated BUN -Remains in a positive balance since admission-continue strict I's/O- note past 24 hours 2450 cc urine output.  Permanent atrial fibrillation - Continue metoprolol, amiodarone -Resting tachycardia likely related to deconditioning -Full dose anticoagulation on hold due to anemia, hemoptysis requiring bronch which showed friable mucosa. (Preadmission was on Eliquis) -Continues to have mild hemoptysis as noted with blood-tinged secretions in ventilator tubing this morning  Diabetes mellitus 2 on long-term insulin; controlled -A1c 7.6% -Continue Lantus to 20 units twice daily-BG is much better controlled with increase in dose and frequency of Lantus on 8/ -Continue SSI  Severe physical deconditioning: -Not candidate for LTAC given chronicity of debility -Plan is to discharge home-CM working on home health options and discussing in detail with daughter -Continue PT and OT  Dysphagia s/p PEG: - Continue bolus tube feeds, free water.   History of CVA - Continue statin   Dementia: - Delirium precautions   Seizure disorder, depression - Continue sertraline, keppra -Has history of TD thus rationale for Cymbalta prior to admission-resumed 8/3   Stage IIIa CKD:  -Currently better than that diagnosis would suggest. - Avoid nephrotoxins -Follow creatinine closely with IV  diuresis -current eGFR >60   Acute hypokalemia: - Resolved   Anemia of chronic disease exacerbated by acute blood loss anemia due to hemoptysis (improving):  -Hgb stable at 8.8-possibly a degree of hemodilution from acute heart failure - Monitor intermittently-no further hemoptysis noted   Hypothyroidism:  -TSH  0.697 - Continue synthroid   Loose stool:  -Likely related to tube feeds with benign exam. - Continue rectal tube for now-100 cc out in the past 24-hour - Continue imodium with low suspicion for GI pathogen.  -Add bulking agent   Data Reviewed: Basic Metabolic Panel: Recent Labs  Lab 01/28/21 0222 01/29/21 0236 01/30/21 0314 02/01/21 0336 02/02/21 0816  NA 136 137 138 139 135  K 3.6 3.2* 4.1 4.2 3.9  CL 104 102 107 105 100  CO2 _0 GLUCOSE 117* 150* 147* 140* 127*  BUN 30* 29* 29* 31* 29*  CREATININE 0.96 0.83 0.91 0.81 0.77  CALCIUM 8.0* 8.0* 8.2* 8.5* 8.7*   Liver Function Tests: Recent Labs  Lab 01/28/21 0222 02/01/21 0336  AST 16 17  ALT 12 14  ALKPHOS 54 55  BILITOT 0.5 0.5  PROT 5.7* 5.7*  ALBUMIN 1.7* 1.9*   No results for input(s): LIPASE, AMYLASE in the last 168 hours. No results for input(s): AMMONIA in the last 168 hours. CBC: Recent Labs  Lab 01/28/21 0217 01/29/21 0236 02/01/21 0336 02/02/21 0816 02/03/21 0406  WBC 9.7 7.7 6.6 7.7 8.3  NEUTROABS  --   --  3.9  --   --   HGB 7.7* 7.8* 8.0* 8.8* 8.2*  HCT 25.6* 25.3* 26.4* 28.6* 27.1*  MCV 94.1 94.4 93.6 92.0 93.4  PLT 177 179 210 248 246   Cardiac Enzymes: No results for input(s): CKTOTAL, CKMB, CKMBINDEX, TROPONINI in the last 168 hours. BNP (last 3 results) Recent Labs    11/21/20 1656 12/21/20 0953 01/03/21 1527  BNP 425.0* 453.0* 614.0*    ProBNP (last 3 results) No results for input(s): PROBNP in the last 8760 hours.  CBG: Recent Labs  Lab 02/02/21 1529 02/02/21 1921 02/02/21 2320 02/03/21 0321 02/03/21 0744  GLUCAP 246* 259* 198* 157* 89    Recent Results (from the past 240 hour(s))  Culture, Respiratory w Gram Stain     Status: None   Collection Time: 01/26/21  9:06 PM   Specimen: Tracheal Aspirate; Respiratory  Result Value Ref Range Status   Specimen Description TRACHEAL ASPIRATE  Final   Special Requests NONE  Final   Gram Stain   Final    FEW  WBC PRESENT, PREDOMINANTLY PMN FEW GRAM POSITIVE RODS RARE GRAM POSITIVE COCCI IN PAIRS Performed at [facility], [place] [address], [place], [place] [address]    Culture MODERATE ENTEROBACTER CLOACAE  Final   Report Status 01/29/2021 FINAL  Final   Organism ID, Bacteria ENTEROBACTER CLOACAE  Final      Susceptibility   Enterobacter cloacae - MIC*    CEFAZOLIN >=64 RESISTANT Resistant     CEFEPIME <=0.12 SENSITIVE Sensitive     CEFTAZIDIME <=1 SENSITIVE Sensitive     CIPROFLOXACIN <=0.25 SENSITIVE Sensitive     GENTAMICIN <=1 SENSITIVE Sensitive     IMIPENEM 0.5 SENSITIVE Sensitive     TRIMETH/SULFA <=20 SENSITIVE Sensitive     PIP/TAZO <=4 SENSITIVE Sensitive     * MODERATE ENTEROBACTER CLOACAE     Scheduled Meds:  amiodarone  200 mg Per Tube Daily   atorvastatin  80 mg Per Tube Daily  chlorhexidine gluconate (MEDLINE KIT)  15 mL Mouth Rinse BID   Chlorhexidine Gluconate Cloth  6 each Topical Q0600   feeding supplement (KATE FARMS STANDARD 1.4)  325 mL Per Tube QID   feeding supplement (PROSource TF)  45 mL Per Tube TID   free water  200 mL Per Tube Q6H   furosemide  20 mg Intravenous BID   gabapentin  100 mg Per Tube Q8H   heparin injection (subcutaneous)  5,000 Units Subcutaneous Q8H   insulin aspart  0-15 Units Subcutaneous Q4H   insulin glargine  20 Units Subcutaneous BID   levETIRAcetam  1,000 mg Per Tube BID   levothyroxine  175 mcg Per Tube Q0600   mouth rinse  15 mL Mouth Rinse 10 times per day   metoprolol tartrate  12.5 mg Per Tube BID   pantoprazole sodium  40 mg Per Tube Daily   sertraline  25 mg Per Tube Daily     Active Problems:   Acute respiratory failure with hypoxia and hypercapnia (HCC)   Acute on chronic respiratory failure with hypoxia and hypercapnia (HCC)   Hypercapnic respiratory failure (HCC)   Status post tracheostomy ([place])   Encephalopathy acute   Hemoptysis   Tracheostomy dependent ([place])   Physical deconditioning   Insulin  dependent type 2 diabetes mellitus, controlled ([place])   Consultants: PCCM Interventional radiology Video-assisted bronchoscopy  Procedures: Echocardiogram Tracheostomy tube PEG tube  Antibiotics: Cefepime 7/3 through 7/8 Azithromycin 7/3 through 7/7 Vancomycin 7/3, 7/4 Cefazolin 7/11 x 1 dose Cefepime 7/27 through 7/31   Time spent: 25 minutes    Erin Hearing ANP  [facility] 7 am - 330 pm/M-F for direct patient care and secure chat Please refer to Amion for contact info 31  days

## 2021-02-03 NOTE — Evaluation (Signed)
Occupational Therapy Evaluation Patient Details Name: [name] MRN: [id number] DOB: [birth year] Today's Date: 02/03/2021    History of Present Illness 70 y.o. female admitted 01/03/21 with acute on chronic respiratory failure with hypoxia. Intubated 7/3-present. 7/7: trachostomy placement. 7/27 occlusion of left lower lobe, trach replaced. PMHx:obesity, T2DM, chronic atrial fibrillation on Eliquis, dementia, diastolic CHF with chronic hypoxemia on 2 L nasal cannula, hypothyroidism, seizure disorder, CKD stage III, CAD with prior NSTEMI, dyslipidemia, and GERD.   Clinical Impression   Patient presenting for OT evaluation after extended hospital admission secondary to diagnosis above and problem list below. Patient currently requiring +2 assist grossly for bed mobility and functional transfers with use of ambulation equipment. Patient also limited by deficits listed below including generalized weakness/debility, decreased cardiopulmonary endurance requiring ventilatory support, and decreased activity tolerance and would benefit from continued acute OT services in prep for safe d/c to next level of care with recommendation for LTACH. OT will continue to follow acutely.     Follow Up Recommendations  LTACH;Home health OT;Supervision/Assistance - 24 hour    Equipment Recommendations  Other (comment) (TBD)    Recommendations for Other Services       Precautions / Restrictions Precautions Precautions: Fall Precaution Comments: vent with trach Restrictions Weight Bearing Restrictions: No      Mobility Bed Mobility Overal bed mobility: Needs Assistance Bed Mobility: Rolling;Sidelying to Sit Rolling: +2 for physical assistance;Min assist Sidelying to sit: Max assist;+2 for physical assistance;HOB elevated       General bed mobility comments: Pt in bed upon arrival. rolled to left with min assist and cues.  Max +2 with assist from bedrails and pad from side to sit. Provided multimodal  cues for hand placement    Transfers Overall transfer level: Needs assistance   Transfers: Sit to/from Stand Sit to Stand: Mod assist;+2 physical assistance;From elevated surface (+3 assist)         General transfer comment: Obtained Stedy for sit to stand. Pt needed  mod assist of 2  Pt needed constant cues and encouragement to stay awake and to participate.  she was able to stand close to full upright to get the buttock pads under her on the [place]. Stood twice    Balance Overall balance assessment: Needs assistance Sitting-balance support: Bilateral upper extremity supported Sitting balance-Leahy Scale: Poor Sitting balance - Comments: Pt was able to sit on EOB with very brief periods of min guard assist as pt lethargic and kept appearing to fall asleep at times and would lose balance posteriorly needing up to total assist to sit eOB. Postural control: Posterior lean Standing balance support: Bilateral upper extremity supported;During functional activity Standing balance-Leahy Scale: Poor Standing balance comment: Relies on UE support for standing                           ADL either performed or assessed with clinical judgement   ADL Overall ADL's : Needs assistance/impaired                     Lower Body Dressing: Total assistance;Bed level Lower Body Dressing Details (indicate cue type and reason): Total A to don footwear at bed level. Toilet Transfer: Total assistance Toilet Transfer Details (indicate cue type and reason): Stedy with Max A +2 for transfer to recliner. Toileting- Clothing Manipulation and Hygiene: Total assistance         General ADL Comments: Dependent grossly     Vision  Baseline Vision/History: No visual deficits Patient Visual Report: No change from baseline Vision Assessment?: No apparent visual deficits     Perception     Praxis      Pertinent Vitals/Pain Pain Assessment: Faces Faces Pain Scale: No hurt Pain  Intervention(s): Monitored during session     Hand Dominance Right   Extremity/Trunk Assessment Upper Extremity Assessment Upper Extremity Assessment: Generalized weakness   Lower Extremity Assessment Lower Extremity Assessment: Defer to PT evaluation   Cervical / Trunk Assessment Cervical / Trunk Assessment: Kyphotic   Communication Communication Communication: Tracheostomy (Vent via trach)   Cognition Arousal/Alertness: Lethargic Behavior During Therapy: WFL for tasks assessed/performed Overall Cognitive Status: Impaired/Different from baseline Area of Impairment: Orientation;Following commands;Awareness                 Orientation Level: Disoriented to;Person;Time;Situation     Following Commands: Follows one step commands inconsistently;Follows one step commands with increased time   Awareness: Intellectual   General Comments: Able to state first name but mouths "I don't know" when asked to state last name, oriented to place and year but not month (mouths January). Pt intermittently responds to one-step commands with accuracy.   General Comments  Daughter arrived mid-session. Encouraged daughter to let pt do as much as she can for herself (daughter stepping in to take her feet off the bed, reaching to give pt a hand to pull up to sitting)    Exercises     Shoulder Instructions      Home Living Family/patient expects to be discharged to:: Other (Comment) (LTACH) Living Arrangements: Spouse/significant other;Children Available Help at Discharge: Family;Available 24 hours/day Type of Home: Mobile home Home Access: Ramped entrance     Home Layout: One level     Bathroom Shower/Tub: Producer, television/film/video: Handicapped height Bathroom Accessibility: Yes   Home Equipment: Bedside commode;Toilet riser;Shower seat;Walker - 2 wheels;Cane - single point;Wheelchair - manual;Hospital bed;Other (comment) (Home O2)   Additional Comments: wears O2 2L at  home.  Lives with daughter, and elderly spouse.  They are able to provide 24 hour supervision up to min A      Prior Functioning/Environment Level of Independence: Needs assistance  Gait / Transfers Assistance Needed: [place] ambulator using RWwith min assist per daughter PTA, on 2 LPM O2 constant ADL's / Homemaking Assistance Needed: assisted by family   Comments: history of falls        OT Problem List: Decreased strength;Decreased range of motion;Decreased activity tolerance;Impaired balance (sitting and/or standing);Decreased coordination;Decreased cognition;Decreased safety awareness;Decreased knowledge of use of DME or AE;Cardiopulmonary status limiting activity;Impaired UE functional use      OT Treatment/Interventions: Self-care/ADL training;Therapeutic exercise;Energy conservation;DME and/or AE instruction;Therapeutic activities;Cognitive remediation/compensation;Patient/family education;Balance training    OT Goals(Current goals can be found in the care plan section) Acute Rehab OT Goals Patient Stated Goal: To get stronger OT Goal Formulation: With patient/family Time For Goal Achievement: 02/17/21 Potential to Achieve Goals: Fair ADL Goals Pt Will Perform Eating: with set-up;sitting Pt Will Perform Grooming: with set-up;sitting Pt Will Perform Upper Body Bathing: with min assist;bed level;sitting Pt Will Perform Upper Body Dressing: with min assist;with mod assist;sitting;bed level Additional ADL Goal #1: Patient will tolerate sitting at EOB for 8-10 minutes with close supervision A in prep for seated grooming tasks. Additional ADL Goal #2: Patient will tolerate 10-15 minutes of therapeutic activity with good alertness/arousal indicating increased activity tolerance. Additional ADL Goal #3: Patient will compelte sit to stand transfers with Mod A and use  of RW in prep for ADL transfers.  OT Frequency: Min 2X/week   Barriers to D/C:            Co-evaluation               AM-PAC OT "6 Clicks" Daily Activity     Outcome Measure Help from another person eating meals?: Total Help from another person taking care of personal grooming?: A Lot Help from another person toileting, which includes using toliet, bedpan, or urinal?: Total Help from another person bathing (including washing, rinsing, drying)?: Total Help from another person to put on and taking off regular upper body clothing?: A Lot Help from another person to put on and taking off regular lower body clothing?: Total 6 Click Score: 8   End of Session Equipment Utilized During Treatment: Gait belt;Oxygen (Full vent support) Nurse Communication: Mobility status;Other (comment) (Response to treatment.)  Activity Tolerance: Patient tolerated treatment well;Patient limited by fatigue Patient left: in chair;with call bell/phone within reach;with chair alarm set;with family/visitor present  OT Visit Diagnosis: Muscle weakness (generalized) (M62.81)                Time: 5093-2671 OT Time Calculation (min): 36 min Charges:  OT General Charges $OT Visit: 1 Visit OT Evaluation $OT Eval Moderate Complexity: 1 Mod OT Treatments $Therapeutic Activity: 8-22 mins  Keatin Benham H. OTR/L Supplemental OT, Department of rehab services [phone]  Zarrah Loveland R H. 02/03/2021, 10:30 AM

## 2021-02-03 NOTE — Plan of Care (Signed)
  Problem: Education: Goal: Knowledge of General Education information will improve Description: Including pain rating scale, medication(s)/side effects and non-pharmacologic comfort measures Outcome: Progressing   Problem: Health Behavior/Discharge Planning: Goal: Ability to manage health-related needs will improve Outcome: Progressing   Problem: Clinical Measurements: Goal: Ability to maintain clinical measurements within normal limits will improve Outcome: Progressing Goal: Will remain free from infection Outcome: Progressing Goal: Diagnostic test results will improve Outcome: Progressing Goal: Respiratory complications will improve Outcome: Progressing Goal: Cardiovascular complication will be avoided Outcome: Progressing   Problem: Activity: Goal: Risk for activity intolerance will decrease Outcome: Progressing   Problem: Nutrition: Goal: Adequate nutrition will be maintained Outcome: Progressing   Problem: Coping: Goal: Level of anxiety will decrease Outcome: Progressing   Problem: Elimination: Goal: Will not experience complications related to bowel motility Outcome: Progressing Goal: Will not experience complications related to urinary retention Outcome: Progressing   Problem: Pain Managment: Goal: General experience of comfort will improve Outcome: Progressing   Problem: Safety: Goal: Ability to remain free from injury will improve Outcome: Progressing   Problem: Skin Integrity: Goal: Risk for impaired skin integrity will decrease Outcome: Progressing   Problem: Education: Goal: Knowledge about tracheostomy care/management will improve Outcome: Progressing   Problem: Activity: Goal: Ability to tolerate increased activity will improve Outcome: Progressing   Problem: Health Behavior/Discharge Planning: Goal: Ability to manage tracheostomy will improve Outcome: Progressing   Problem: Respiratory: Goal: Patent airway maintenance will  improve Outcome: Progressing   Problem: Role Relationship: Goal: Ability to communicate will improve Outcome: Progressing   

## 2021-02-03 NOTE — Progress Notes (Signed)
SLP Cancellation Note  Patient Details Name: [name] MRN: [id number] DOB: May 28, 1951   Cancelled treatment:       Reason Eval/Treat Not Completed: Patient not medically ready (Pt is currently on the vent. SLP will follow up.)  Sharnay Cashion I. Vear Clock, MS, CCC-SLP Acute Rehabilitation Services Office number [phone] Pager [phone]  [name] 02/03/2021, 10:46 AM

## 2021-02-03 NOTE — Progress Notes (Signed)
Physical Therapy Treatment Patient Details Name: [name] MRN: [id number] DOB: 02-21-1951 Today's Date: 02/03/2021    History of Present Illness 70 y.o. female admitted 01/03/21 with acute on chronic respiratory failure with hypoxia. Intubated 7/3-present. 7/7: trachostomy placement. 7/27 occlusion of left lower lobe, trach replaced. PMHx:obesity, T2DM, chronic atrial fibrillation on Eliquis, dementia, diastolic CHF with chronic hypoxemia on 2 L nasal cannula, hypothyroidism, seizure disorder, CKD stage III, CAD with prior NSTEMI, dyslipidemia, and GERD.    PT Comments    Patient alert and on PRVC on arrival. Sats 100%. Able to progress to standing with stedy and transfer OOB with +2 mod assist. NP in and reports pt is requiring incr vent support compared to previously and they are going to submit for review for LTACH again.     Follow Up Recommendations  LTACH;(if LTACH declined, will need max home services including Home health PT);Supervision/Assistance - 24 hour     Equipment Recommendations  Wheelchair (measurements PT);Wheelchair cushion (measurements PT);Other (comment) (hoyer lift)    Recommendations for Other Services       Precautions / Restrictions Precautions Precautions: Fall Precaution Comments: vent with trach    Mobility  Bed Mobility Overal bed mobility: Needs Assistance Bed Mobility: Rolling;Sidelying to Sit Rolling: +2 for physical assistance;Min assist Sidelying to sit: Max assist;+2 for physical assistance;HOB elevated       General bed mobility comments: Pt in bed upon arrival. rolled to left with min assist and cues.  Max +2 with assist from bedrails and pad from side to sit. Provided multimodal cues for hand placement    Transfers Overall transfer level: Needs assistance   Transfers: Sit to/from Stand Sit to Stand: Mod assist;+2 physical assistance;From elevated surface (+3 assist)         General transfer comment: Obtained Stedy for sit to  stand. Pt needed  mod assist of 2  Pt needed constant cues and encouragement to stay awake and to participate.  she was able to stand close to full upright to get the buttock pads under her on the [place]. Stood twice  Ambulation/Gait                 Social research officer, government [employer] (Stroke Patients Only)       Balance Overall balance assessment: Needs assistance Sitting-balance support: Bilateral upper extremity supported Sitting balance-Leahy Scale: Poor Sitting balance - Comments: Pt was able to sit on EOB with very brief periods of min guard assist as pt lethargic and kept appearing to fall asleep at times and would lose balance posteriorly needing up to total assist to sit eOB. Postural control: Posterior lean Standing balance support: Bilateral upper extremity supported;During functional activity Standing balance-Leahy Scale: Poor Standing balance comment: Relies on UE support for standing                            Cognition Arousal/Alertness: Lethargic Behavior During Therapy: WFL for tasks assessed/performed Overall Cognitive Status: Within Functional Limits for tasks assessed                                 General Comments: Pt intermittently responds to one-step commands with accuracy.      Exercises      General Comments General comments (skin integrity, edema, etc.): Daughter arrived mid-session.  Encouraged daughter to let pt do as much as she can for herself (daughter stepping in to take her feet off the bed, reaching to give pt a hand to pull up to sitting)      Pertinent Vitals/Pain Pain Assessment: No/denies pain Faces Pain Scale: No hurt    Home Living                      Prior Function            PT Goals (current goals can now be found in the care plan section) Acute Rehab PT Goals Patient Stated Goal: To get stronger Time For Goal Achievement: 02/11/21 Potential to  Achieve Goals: Fair Progress towards PT goals: Progressing toward goals    Frequency    Min 3X/week      PT Plan Discharge plan needs to be updated    Co-evaluation              AM-PAC PT "6 Clicks" Mobility   Outcome Measure  Help needed turning from your back to your side while in a flat bed without using bedrails?: Total Help needed moving from lying on your back to sitting on the side of a flat bed without using bedrails?: Total Help needed moving to and from a bed to a chair (including a wheelchair)?: Total Help needed standing up from a chair using your arms (e.g., wheelchair or bedside chair)?: Total Help needed to walk in hospital room?: Total Help needed climbing 3-5 steps with a railing? : Total 6 Click Score: 6    End of Session Equipment Utilized During Treatment: Gait belt;Other (comment) (stedy) Activity Tolerance: Patient limited by fatigue Patient left: with call bell/phone within reach;in chair;with chair alarm set;with family/visitor present Nurse Communication: Mobility status;Need for lift equipment PT Visit Diagnosis: Unsteadiness on feet (R26.81);Muscle weakness (generalized) (M62.81);Difficulty in walking, not elsewhere classified (R26.2)     Time: 5956-3875 PT Time Calculation (min) (ACUTE ONLY): 34 min  Charges:  $Therapeutic Activity: 8-22 mins                      Jerolyn Center, PT Pager [phone]    Zena Amos 02/03/2021, 9:45 AM

## 2021-02-04 ENCOUNTER — Inpatient Hospital Stay (HOSPITAL_COMMUNITY): Payer: PPO

## 2021-02-04 LAB — BASIC METABOLIC PANEL
Anion gap: 8 (ref 5–15)
BUN: 28 mg/dL — ABNORMAL HIGH (ref 8–23)
CO2: 27 mmol/L (ref 22–32)
Calcium: 8.7 mg/dL — ABNORMAL LOW (ref 8.9–10.3)
Chloride: 104 mmol/L (ref 98–111)
Creatinine, Ser: 0.82 mg/dL (ref 0.44–1.00)
GFR, Estimated: >60 mL/min (ref >60)
Glucose, Bld: 77 mg/dL (ref 70–99)
Potassium: 3.7 mmol/L (ref 3.5–5.1)
Sodium: 139 mmol/L (ref 135–145)

## 2021-02-04 LAB — GLUCOSE, CAPILLARY
Glucose-Capillary: 103 mg/dL — ABNORMAL HIGH (ref 70–99)
Glucose-Capillary: 147 mg/dL — ABNORMAL HIGH (ref 70–99)
Glucose-Capillary: 170 mg/dL — ABNORMAL HIGH (ref 70–99)
Glucose-Capillary: 171 mg/dL — ABNORMAL HIGH (ref 70–99)
Glucose-Capillary: 205 mg/dL — ABNORMAL HIGH (ref 70–99)
Glucose-Capillary: 60 mg/dL — ABNORMAL LOW (ref 70–99)
Glucose-Capillary: 97 mg/dL (ref 70–99)

## 2021-02-04 MED ORDER — SODIUM CHLORIDE 0.9 % IV SOLN
8.0000 mg | Freq: Once | INTRAVENOUS | Status: AC
  Administered 2021-02-04: 8 mg via INTRAVENOUS
  Filled 2021-02-04: qty 4

## 2021-02-04 MED ORDER — DEXTROSE 50 % IV SOLN
12.5000 g | Freq: Once | INTRAVENOUS | Status: AC
  Administered 2021-02-04: 12.5 g via INTRAVENOUS
  Filled 2021-02-04: qty 50

## 2021-02-04 MED ORDER — MECLIZINE HCL 12.5 MG PO TABS
12.5000 mg | ORAL_TABLET | Freq: Three times a day (TID) | ORAL | Status: DC | PRN
  Filled 2021-02-04: qty 1

## 2021-02-04 NOTE — Progress Notes (Signed)
   02/04/21 2336  Vent Select  Invasive or Noninvasive Invasive  Adult Vent Y  Tracheostomy Shiley Flexible 8 mm Cuffed  Placement Date/Time: 01/27/21 1200   Placed By: (c) ICU physician  Brand: Shiley Flexible  Size (mm): 8 mm  Style: Cuffed  Status Secured  Site Assessment Dry;Clean  Site Care Dried  Ties Assessment Clean;Dry;Secure  Cuff Pressure (cm H2O) Green OR 18-26 CmH2O  Tracheostomy Equipment at bedside Yes and checklist posted at head of bed  Adult Ventilator Settings  Vent Type Servo i  Humidity HME  Vent Mode PRVC  Vt Set 510 mL  Set Rate 22 bmp  FiO2 (%) 35 %  I Time 0.9 Sec(s)  PEEP 5 cmH20  Adult Ventilator Measurements  Peak Airway Pressure 16 L/min  Mean Airway Pressure 9 cmH20  Resp Rate Spontaneous 4 br/min  Resp Rate Total 26 br/min  Exhaled Vt 672 mL  Measured Ve 10.2 mL  I:E Ratio Measured 1:2.0  Auto PEEP 0 cmH20  Total PEEP 5 cmH20  SpO2 96 %  Adult Ventilator Alarms  Alarms On Y  Ve High Alarm 20 L/min  Ve Low Alarm 4 L/min  Resp Rate High Alarm 38 br/min  Resp Rate Low Alarm 12  PEEP Low Alarm 3 cmH2O  Press High Alarm 40 cmH2O  Press Low Alarm 2 cmH2O  T Apnea 20 sec(s)  VAP Prevention  HOB> 30 Degrees Y  Breath Sounds  Bilateral Breath Sounds Rhonchi  Airway Suctioning/Secretions  Suction Type Tracheal  Suction Device  Catheter  Secretion Amount Small  Secretion Color White  Secretion Consistency Thick  Suction Tolerance Tolerated well  Placed pt. Back on vent due to increased WOB after vomiting .

## 2021-02-04 NOTE — Progress Notes (Addendum)
[facility] PROGRESS NOTE  [name] OHF:[id number] DOB: Mar 24, 1951 DOA: 01/03/2021 PCP: Lemmie Evens, MD   Status: Remains inpatient appropriate because:Unsafe d/c plan, IV treatments appropriate due to intensity of illness or inability to take PO, and Inpatient level of care appropriate due to severity of illness  Dispo: The patient is from: Home              Anticipated d/c is to: Home once generator for electrical back up for nocturnal vent installed; Possible dc to [facility] capable SNF-also HH will not accept trach < 30 days post insertion; also needs to prove can tolerate Eliquis w/o bleeding as well              Patient currently is not medically stable to d/c.   Difficult to place patient No    Level of care: Med-Surg 2/2 friable pulmonary mucosa/new trach/requirement for nocturnal ventilator  Code Status: Full Family Communication: Daughter at bedside on 8/2 DVT prophylaxis: Subcu heparin COVID vaccination status: Not documented  HPI: 70 yo female who presented to [facility] on 7/03 with dyspnea, and altered mental status. Had admission from 12/21/20 to 12/30/20 for respiratory failure and CHF exacerbation.Transferred to [facility]. SpO2 65% and elevated PCO2.  Failed Bipap and required intubation. Course since then complicated by prolonged mechanical ventilation requiring tracheostomy 7/7. She has been managed by PCCM for 4 weeks, deemed to require home vent which is delayed by need for generator at home. This is being arranged. She was transferred to hospitalist service on 02/01/2021.   Significant events: 7/3  chronic HCRF from OHS and restrictive lung dz as well as deconditioning-->progression -->decompensated HFpEF and cor pulmonale-->worsening metabolic enceph from HCRF-->PNA --> acute on chronic resp failure. Admitted. Intubated. Ceftriaxone 7/3 x 1. Azith and Cefepime started. 7/4-7/5 Hypotension overnight. A line placed. 553m LR bolus. Started on NAkhiokplaced 7/6 Hgb 6.7  (7.4), CT A/P completed to r/o RP bleed (negative), showed mild peripancreatic stranding. Lipase slightly elevated. 1U PRBCs ordered. 7/7 Tracheostomy placed, heparin gtt held for procedure. 7/8 Increased agitation with Propofol wean, increased HR, uptrending pressor requirements. Cortrak placed. Azith and cefepime stopped. 7/11 PEG tube placed, tolerating PSV 7/12 ATC trials started. Tubefeeds started/resumed 7/13 solucortef decreased to daily. Midodrine stopped. Heparin gtt stopped. Stated back on DOAC. PANDA removed 7/14: sat at side of bed w/ max assist for 15 minutes. Fatigued quickly. PT recommending LTAC setting 7/15: Tolerated trach collar yesterday 7/16: TCT x 2 hours 7/18 hgb 6.8 received 1 unit PRBC. LTACH appeal denied 7/22 Tolerating brief periods of trach collar and PMV, Periods of emesis >> TFs held, Bright red blood noted by RT 7/23 left atelectasis, bronchoscopy showing inspissated clot occluding distal trachea, needed cryo probe to remove clot down to left mainstem 7/20 4 repeat bronchoscopy with cryoprobe to remove residual clot left upper lobe and lower lobe, oozing of underlying mucosa, distal clot remains 7/23 atx due to clot. Trach replaced with ETT for purpose of bronchoscopy. Inspissated clot suctioned. No obvious proximal bleeding source identified. Required TXA nebs and Cryoprobe 7/25 still w/ bloody trach secretions. Tolerating CPAP 7/26 hgb down 6.9 1 u PRBC transfused; complete white out of left hemithorax (again). Underwent bronch w/ cryoprobe for removal of clots. large clot in the left lower lobe as well as friable mucosa throughout the left tracheobronchial tree. Low grade temp. Concern for possible VAP/HCAP so sputum sent and Cefepime started. 7/27: Trach placement 7/29 tolerating PSV  7/30 ATC alt w/ nocturnal vent  8/2 increased WOB and has required more frequent vent support in PRVC mode  Subjective: Alert.  Recognize me upon my entry into the room and she  waved and smiled at me.  Reports that she is feeling okay.  Was able to relay the although it was good to be able to get up to the chair it was very draining.  Objective: Vitals:   02/04/21 0616 02/04/21 0700  BP: (!) 90/45 113/65  Pulse: (!) 57 65  Resp: 11 (!) 22  Temp:    SpO2: 96% 98%    Intake/Output Summary (Last 24 hours) at 02/04/2021 0731 Last data filed at 02/04/2021 0532 Gross per 24 hour  Intake 1210 ml  Output 2500 ml  Net -1290 ml   Filed Weights   01/31/21 0500 02/01/21 0500 02/02/21 0500  Weight: 97.6 kg 94.6 kg 94.6 kg    Exam:  Constitutional: Alert, calm, no acute distress Respiratory: Midline tracheostomy  #8.0 cuffed; on full ventilatory support: VC mode FiO2 30 %, rate 22, VT 510, PEEP 5, no bloody drainage noted on ventilator tubing.  No increased work of breathing at rest.  Lung sounds are coarse to auscultation. Cardiovascular: Fibrillation with ventricular rates between 68 and 86, normotensive.  No peripheral edema. Abdomen: PEG for bolus feeds-insertion site unremarkable.  Abdomen soft and nontender with normoactive bowel sound Skin: documented stage I sacral pressure injury as well as lower right arm skin tear and groin MASD Neurologic: CN 2-12 grossly intact. Sensation intact,  Strength 3/5 x all 4 extremities.  Psychiatric: Alert and appears oriented x3 although difficult to accurately ascertain given placement of trach tube in current requirement for full ventilatory support   Assessment/Plan: Acute problems:  Acute on chronic hypoxemic and hypercarbic respiratory failure: Multifactorial including restrictive lung disease due to kyphoscoliosis, OHS/OSA, tracheobronchitis leading to hemoptysis. - Per PCCM -Plan was for nocturnal ventilator at discharge-initially rec for replacement of nocturnal BiPAP. Currently pt requiring int daytime vent support in PRVC mode. Needs to tolerate daytime TC consistently befire can DC. -?  Amiodarone lung injury-we  will check iron level   Acute on chronic combined CHF LV systolic dysfunction EF 45 to 50% Moderate RV systolic dysfunction Severe pulmonary hypertension 60 mmHg Mild to moderate MR and moderate TR:  -Volume status improved.  Exacerbated by underlying hypoalbuminemia -Cont BB, not on ARB/ACE inhibitor secondary to suboptimal blood pressure reading -Continue Lasix 20 mg IV every 12 hours-currently in a neg balance-UOP past 24 hrs = 2500 cc; if remains in a negative balance over the next 24 hours consider transitioning over to oral formulation of Lasix -Renal function normal with mildly elevated BUN -continue strict I's/O  Permanent atrial fibrillation - Continue metoprolol, amiodarone -Resting tachycardia likely related to deconditioning -Full dose anticoagulation on hold due to anemia, hemoptysis requiring bronch which showed friable mucosa. (Preadmission was on Eliquis) -8/4 no further hemoptysis  Diabetes mellitus 2 on long-term insulin; controlled -A1c 7.6% -Continue Lantus to 20 units twice daily-BG is much better controlled with increase in dose and frequency of Lantus on 8/ -Continue SSI  Abdominal pain with nausea -Suspect underlying obstipation -Check portable abdominal film -Continue as needed Compazine -We will give one-time dose Zofran 8 mg -If significant stool burden we will need to begin bowel regimen  Severe physical deconditioning: -Not candidate for LTAC given chronicity of debility -Plan is to discharge home-CM working on home health options and discussing in detail with daughter -Continue PT and OT  Dysphagia s/p PEG: -  Continue bolus tube feeds, free water.   History of CVA - Continue statin   Dementia: - Delirium precautions   Seizure disorder, depression - Continue sertraline, keppra -Has history of TD thus rationale for Cymbalta prior to admission-resumed 8/3   Stage IIIa CKD:  -Currently better than that diagnosis would suggest. - Avoid  nephrotoxins -Follow creatinine closely with IV diuresis -current eGFR >60   Acute hypokalemia: - Resolved   Anemia of chronic disease exacerbated by acute blood loss anemia due to hemoptysis (improving):  -Hgb stable at 8.8-possibly a degree of hemodilution from acute heart failure - Monitor intermittently-no further hemoptysis noted   Hypothyroidism:  -TSH 0.697 - Continue synthroid   Loose stool:  -Likely related to tube feeds with benign exam. - Continue rectal tube for now - Continue imodium with low suspicion for GI pathogen.  -Add bulking agent   Data Reviewed: Basic Metabolic Panel: Recent Labs  Lab 01/29/21 0236 01/30/21 0314 02/01/21 0336 02/02/21 0816  NA 137 138 139 135  K 3.2* 4.1 4.2 3.9  CL 102 107 105 100  CO2 _0 GLUCOSE 150* 147* 140* 127*  BUN 29* 29* 31* 29*  CREATININE 0.83 0.91 0.81 0.77  CALCIUM 8.0* 8.2* 8.5* 8.7*   Liver Function Tests: Recent Labs  Lab 02/01/21 0336  AST 17  ALT 14  ALKPHOS 55  BILITOT 0.5  PROT 5.7*  ALBUMIN 1.9*   No results for input(s): LIPASE, AMYLASE in the last 168 hours. No results for input(s): AMMONIA in the last 168 hours. CBC: Recent Labs  Lab 01/29/21 0236 02/01/21 0336 02/02/21 0816 02/03/21 0406  WBC 7.7 6.6 7.7 8.3  NEUTROABS  --  3.9  --   --   HGB 7.8* 8.0* 8.8* 8.2*  HCT 25.3* 26.4* 28.6* 27.1*  MCV 94.4 93.6 92.0 93.4  PLT 179 210 248 246   Cardiac Enzymes: No results for input(s): CKTOTAL, CKMB, CKMBINDEX, TROPONINI in the last 168 hours. BNP (last 3 results) Recent Labs    11/21/20 1656 12/21/20 0953 01/03/21 1527  BNP 425.0* 453.0* 614.0*    ProBNP (last 3 results) No results for input(s): PROBNP in the last 8760 hours.  CBG: Recent Labs  Lab 02/03/21 1113 02/03/21 1521 02/03/21 1922 02/03/21 2310 02/04/21 0317  GLUCAP 186* 216* 216* 202* 97    Recent Results (from the past 240 hour(s))  Culture, Respiratory w Gram Stain     Status: None   Collection  Time: 01/26/21  9:06 PM   Specimen: Tracheal Aspirate; Respiratory  Result Value Ref Range Status   Specimen Description TRACHEAL ASPIRATE  Final   Special Requests NONE  Final   Gram Stain   Final    FEW WBC PRESENT, PREDOMINANTLY PMN FEW GRAM POSITIVE RODS RARE GRAM POSITIVE COCCI IN PAIRS Performed at [facility], [place] [address] [place], [place] [address]    Culture MODERATE ENTEROBACTER CLOACAE  Final   Report Status 01/29/2021 FINAL  Final   Organism ID, Bacteria ENTEROBACTER CLOACAE  Final      Susceptibility   Enterobacter cloacae - MIC*    CEFAZOLIN >=64 RESISTANT Resistant     CEFEPIME <=0.12 SENSITIVE Sensitive     CEFTAZIDIME <=1 SENSITIVE Sensitive     CIPROFLOXACIN <=0.25 SENSITIVE Sensitive     GENTAMICIN <=1 SENSITIVE Sensitive     IMIPENEM 0.5 SENSITIVE Sensitive     TRIMETH/SULFA <=20 SENSITIVE Sensitive     PIP/TAZO <=4 SENSITIVE Sensitive     *  MODERATE ENTEROBACTER CLOACAE     Scheduled Meds:  amiodarone  200 mg Per Tube Daily   atorvastatin  80 mg Per Tube Daily   chlorhexidine gluconate (MEDLINE KIT)  15 mL Mouth Rinse BID   Chlorhexidine Gluconate Cloth  6 each Topical Q0600   DULoxetine  30 mg Oral BID   feeding supplement (KATE FARMS STANDARD 1.4)  325 mL Per Tube QID   feeding supplement (PROSource TF)  45 mL Per Tube TID   free water  200 mL Per Tube Q6H   furosemide  20 mg Intravenous BID   gabapentin  100 mg Per Tube Q8H   heparin injection (subcutaneous)  5,000 Units Subcutaneous Q8H   insulin aspart  0-15 Units Subcutaneous Q4H   insulin glargine  20 Units Subcutaneous BID   levETIRAcetam  1,000 mg Per Tube BID   levothyroxine  175 mcg Per Tube Q0600   mouth rinse  15 mL Mouth Rinse 10 times per day   metoprolol tartrate  12.5 mg Per Tube BID   pantoprazole sodium  40 mg Per Tube Daily   sertraline  25 mg Per Tube Daily   tranexamic acid  500 mg Nebulization BID     Active Problems:   Acute respiratory failure with hypoxia and  hypercapnia (HCC)   Acute on chronic respiratory failure with hypoxia and hypercapnia (HCC)   Hypercapnic respiratory failure (HCC)   Status post tracheostomy ([place])   Encephalopathy acute   Hemoptysis   Tracheostomy dependent ([place])   Physical deconditioning   Insulin dependent type 2 diabetes mellitus, controlled ([place])   Ventilator dependence ([place])   Consultants: PCCM Interventional radiology Video-assisted bronchoscopy  Procedures: Echocardiogram Tracheostomy tube PEG tube  Antibiotics: Cefepime 7/3 through 7/8 Azithromycin 7/3 through 7/7 Vancomycin 7/3, 7/4 Cefazolin 7/11 x 1 dose Cefepime 7/27 through 7/31   Time spent: 25 minutes    Erin Hearing ANP  [facility] 7 am - 330 pm/M-F for direct patient care and secure chat Please refer to Amion for contact info 32  days

## 2021-02-04 NOTE — Plan of Care (Signed)
  Problem: Education: Goal: Knowledge of General Education information will improve Description: Including pain rating scale, medication(s)/side effects and non-pharmacologic comfort measures Outcome: Progressing   Problem: Health Behavior/Discharge Planning: Goal: Ability to manage health-related needs will improve Outcome: Progressing   Problem: Clinical Measurements: Goal: Ability to maintain clinical measurements within normal limits will improve Outcome: Progressing Goal: Will remain free from infection Outcome: Progressing Goal: Diagnostic test results will improve Outcome: Progressing Goal: Respiratory complications will improve Outcome: Progressing Goal: Cardiovascular complication will be avoided Outcome: Progressing   Problem: Activity: Goal: Risk for activity intolerance will decrease Outcome: Progressing   Problem: Nutrition: Goal: Adequate nutrition will be maintained Outcome: Progressing   Problem: Coping: Goal: Level of anxiety will decrease Outcome: Progressing   Problem: Elimination: Goal: Will not experience complications related to bowel motility Outcome: Progressing Goal: Will not experience complications related to urinary retention Outcome: Progressing   Problem: Pain Managment: Goal: General experience of comfort will improve Outcome: Progressing   Problem: Safety: Goal: Ability to remain free from injury will improve Outcome: Progressing   Problem: Skin Integrity: Goal: Risk for impaired skin integrity will decrease Outcome: Progressing   Problem: Education: Goal: Knowledge about tracheostomy care/management will improve Outcome: Progressing   Problem: Activity: Goal: Ability to tolerate increased activity will improve Outcome: Progressing   Problem: Health Behavior/Discharge Planning: Goal: Ability to manage tracheostomy will improve Outcome: Progressing   Problem: Respiratory: Goal: Patent airway maintenance will  improve Outcome: Progressing   Problem: Role Relationship: Goal: Ability to communicate will improve Outcome: Progressing   

## 2021-02-05 LAB — GLUCOSE, CAPILLARY
Glucose-Capillary: 134 mg/dL — ABNORMAL HIGH (ref 70–99)
Glucose-Capillary: 35 mg/dL — CL (ref 70–99)
Glucose-Capillary: 126 mg/dL — ABNORMAL HIGH (ref 70–99)
Glucose-Capillary: 123 mg/dL — ABNORMAL HIGH (ref 70–99)
Glucose-Capillary: 180 mg/dL — ABNORMAL HIGH (ref 70–99)
Glucose-Capillary: 181 mg/dL — ABNORMAL HIGH (ref 70–99)

## 2021-02-05 MED ORDER — FUROSEMIDE 40 MG PO TABS
40.0000 mg | ORAL_TABLET | Freq: Every day | ORAL | Status: DC
  Administered 2021-02-05 – 2021-02-15 (×11): 40 mg
  Filled 2021-02-05 (×11): qty 1

## 2021-02-05 MED ORDER — INSULIN GLARGINE-YFGN 100 UNIT/ML ~~LOC~~ SOLN
10.0000 [IU] | Freq: Every day | SUBCUTANEOUS | Status: DC
  Administered 2021-02-05: 10 [IU] via SUBCUTANEOUS
  Filled 2021-02-05 (×2): qty 0.1

## 2021-02-05 MED ORDER — INSULIN GLARGINE-YFGN 100 UNIT/ML ~~LOC~~ SOLN
15.0000 [IU] | Freq: Every day | SUBCUTANEOUS | Status: DC
  Administered 2021-02-05 – 2021-02-06 (×2): 15 [IU] via SUBCUTANEOUS
  Filled 2021-02-05 (×3): qty 0.15

## 2021-02-05 MED ORDER — DICYCLOMINE HCL 10 MG/ML IM SOLN
10.0000 mg | Freq: Once | INTRAMUSCULAR | Status: AC
  Administered 2021-02-05: 10 mg via INTRAMUSCULAR
  Filled 2021-02-05: qty 1

## 2021-02-05 MED ORDER — DEXTROSE 50 % IV SOLN
INTRAVENOUS | Status: AC
  Administered 2021-02-05: 50 mL
  Filled 2021-02-05: qty 50

## 2021-02-05 MED ORDER — INSULIN GLARGINE-YFGN 100 UNIT/ML ~~LOC~~ SOLN: 15.0000 [IU] | Freq: Every day | SUBCUTANEOUS | Status: DC

## 2021-02-05 MED ORDER — INSULIN GLARGINE 100 UNIT/ML ~~LOC~~ SOLN: 20.0000 [IU] | Freq: Every day | SUBCUTANEOUS | Status: DC

## 2021-02-05 MED ORDER — INSULIN GLARGINE 100 UNIT/ML ~~LOC~~ SOLN: 15.0000 [IU] | Freq: Every day | SUBCUTANEOUS | Status: DC

## 2021-02-05 NOTE — Progress Notes (Signed)
SLP Cancellation Note  Patient Details Name: [name] MRN: [id number] DOB: Jan 09, 1951   Cancelled treatment:    Pt resting on vent, did not sleep much last night.  Will return later today if schedule allows to work on PMV trials. D/W RN.  Arleth Mccullar L. Samson Frederic, MA CCC/SLP Acute Rehabilitation Services Office number [phone] Pager [phone]        [name] 02/05/2021, 9:33 AM

## 2021-02-05 NOTE — Progress Notes (Signed)
[facility] PROGRESS NOTE  [name] OHF:[id number] DOB: Mar 24, 1951 DOA: 01/03/2021 PCP: Lemmie Evens, MD   Status: Remains inpatient appropriate because:Unsafe d/c plan, IV treatments appropriate due to intensity of illness or inability to take PO, and Inpatient level of care appropriate due to severity of illness  Dispo: The patient is from: Home              Anticipated d/c is to: Home once generator for electrical back up for nocturnal vent installed; Possible dc to [facility] SN[facility]-also HH will not accept trach < 30 days post insertion; also needs to prove can tolerate Eliquis w/o bleeding as well              Patient currently is not medically stable to d/c.   Difficult to place patient No    Level of care: Med-Surg 2/2 friable pulmonary mucosa/new trach/requirement for nocturnal ventilator  Code Status: Full Family Communication: Daughter at bedside on 8/2 DVT prophylaxis: Subcu heparin COVID vaccination status: Not documented  HPI: 70 yo female who presented to [facility] on 7/03 with dyspnea, and altered mental status. Had admission from 12/21/20 to 12/30/20 for respiratory failure and CHF exacerbation.Transferred to [facility]. SpO2 65% and elevated PCO2.  Failed Bipap and required intubation. Course since then complicated by prolonged mechanical ventilation requiring tracheostomy 7/7. She has been managed by PCCM for 4 weeks, deemed to require home vent which is delayed by need for generator at home. This is being arranged. She was transferred to hospitalist service on 02/01/2021.   Significant events: 7/3  chronic HCRF from OHS and restrictive lung dz as well as deconditioning-->progression -->decompensated HFpEF and cor pulmonale-->worsening metabolic enceph from HCRF-->PNA --> acute on chronic resp failure. Admitted. Intubated. Ceftriaxone 7/3 x 1. Azith and Cefepime started. 7/4-7/5 Hypotension overnight. A line placed. 553m LR bolus. Started on NAkhiokplaced 7/6 Hgb 6.7  (7.4), CT A/P completed to r/o RP bleed (negative), showed mild peripancreatic stranding. Lipase slightly elevated. 1U PRBCs ordered. 7/7 Tracheostomy placed, heparin gtt held for procedure. 7/8 Increased agitation with Propofol wean, increased HR, uptrending pressor requirements. Cortrak placed. Azith and cefepime stopped. 7/11 PEG tube placed, tolerating PSV 7/12 ATC trials started. Tubefeeds started/resumed 7/13 solucortef decreased to daily. Midodrine stopped. Heparin gtt stopped. Stated back on DOAC. PANDA removed 7/14: sat at side of bed w/ max assist for 15 minutes. Fatigued quickly. PT recommending LTAC setting 7/15: Tolerated trach collar yesterday 7/16: TCT x 2 hours 7/18 hgb 6.8 received 1 unit PRBC. LTACH appeal denied 7/22 Tolerating brief periods of trach collar and PMV, Periods of emesis >> TFs held, Bright red blood noted by RT 7/23 left atelectasis, bronchoscopy showing inspissated clot occluding distal trachea, needed cryo probe to remove clot down to left mainstem 7/20 4 repeat bronchoscopy with cryoprobe to remove residual clot left upper lobe and lower lobe, oozing of underlying mucosa, distal clot remains 7/23 atx due to clot. Trach replaced with ETT for purpose of bronchoscopy. Inspissated clot suctioned. No obvious proximal bleeding source identified. Required TXA nebs and Cryoprobe 7/25 still w/ bloody trach secretions. Tolerating CPAP 7/26 hgb down 6.9 1 u PRBC transfused; complete white out of left hemithorax (again). Underwent bronch w/ cryoprobe for removal of clots. large clot in the left lower lobe as well as friable mucosa throughout the left tracheobronchial tree. Low grade temp. Concern for possible VAP/HCAP so sputum sent and Cefepime started. 7/27: Trach placement 7/29 tolerating PSV  7/30 ATC alt w/ nocturnal vent  8/2 increased WOB and has required more frequent vent support in PRVC mode  Subjective: Alert and able to communicate verbally by mouthing words.   Some words are actually able to be phonated over the trach and ventilator.  Denied continued nausea or abdominal pain but could not correlate whether the symptoms occurred after bolus tube feedings.  She did state that by the end of being on the trach collar last night before being placed back on the ventilator she did become quite fatigued.  Objective: Vitals:   02/05/21 0600 02/05/21 0700  BP: (!) 103/51 103/60  Pulse: (!) 57 (!) 55  Resp: 16 (!) 8  Temp:    SpO2: 93% 91%    Intake/Output Summary (Last 24 hours) at 02/05/2021 0741 Last data filed at 02/05/2021 0600 Gross per 24 hour  Intake 1410 ml  Output 1950 ml  Net -540 ml   Filed Weights   02/01/21 0500 02/02/21 0500 02/05/21 0500  Weight: 94.6 kg 94.6 kg 91.5 kg    Exam:  Constitutional: Appears chronically ill but in good spirits, no acute distress Respiratory: Midline tracheostomy  #8.0 cuffed;  PRVC mode FiO2 30 %, rate 22, VT 510, PEEP 5 HS and TC 35% FIO2 during the day; lung sounds are clear anteriorly but coarse.  Tiny amount of blood-tinged drainage noted in ventilator tubing.  No increased work of breathing Cardiovascular: AF, did have some transient bradycardia while sleeping with rates in the upper 50s but remained normotensive.  Currently rate controlled, no peripheral edema, normal heart sound Abdomen: PEG for bolus feeds-nontender nondistended.  Normoactive bowel sounds.  Rectal tube has been removed. Skin: documented stage I sacral pressure injury as well as lower right arm skin tear and groin MASD Neurologic: CN 2-12 grossly intact. Sensation intact,  Strength 3/5 x all 4 extremities.  Psychiatric: Alert and oriented x3.  Able to mouth words and does have some successful phonation.  Pleasant affect.  Smiles frequently.   Assessment/Plan: Acute problems:  Acute on chronic hypoxemic and hypercarbic respiratory failure: Multifactorial including restrictive lung disease due to kyphoscoliosis, OHS/OSA,  tracheobronchitis leading to hemoptysis. - Per PCCM -8/5 tolerated 12 hours on trach collar on 8/4 -?  Amiodarone lung injury-level pending collection   Acute on chronic combined CHF LV systolic dysfunction EF 45 to 50% Moderate RV systolic dysfunction Severe pulmonary hypertension 60 mmHg Mild to moderate MR and moderate TR:  -Volume status improved.  Exacerbated by underlying hypoalbuminemia -Cont BB; not on ARB/ACE inhibitor secondary to suboptimal blood pressure reading -8/5 urine output with IV Lasix has decreased to 1400 cc in the past 24 hours and patient currently in a negative balance.  We will transition to Lasix per tube 40 mg daily -Renal function normal with mildly elevated BUN-follow electrolytes every 48 hours -continue strict I's/O  Permanent atrial fibrillation - Continue metoprolol, amiodarone -Resting tachycardia likely related to deconditioning;; while sleeping patient had bradycardia into the high 50s which is not unexpected-continue to trend -Full dose anticoagulation on hold due to anemia, hemoptysis requiring bronch which showed friable mucosa. (Preadmission was on Eliquis) -8/4 no further hemoptysis  Diabetes mellitus 2 on long-term insulin; controlled -A1c 7.6% -8/5 this a.m. had 2 readings of hypoglycemia with CBGs in the 30s requiring treatment.  Lantus/Semglee adjusted as follows: A.m. dose decreased to 15 units in p.m. dose decreased to 10 units -Continue SSI  Abdominal pain with nausea -No further nausea or abdominal discomfort -Possible related to bolus feeding-monitor for correlation -Abdominal x-ray revealed  diffuse gaseous distention of the colon with gas up to the level of the rectum with decreased small bowel gas.  Otherwise nonobstructive.  Severe physical deconditioning: -Not candidate for LTAC given chronicity of debility -Plan is to discharge home-CM working on home health options and discussing in detail with daughter -Continue PT and  OT  Dysphagia s/p PEG: - Continue bolus tube feeds, free water.   History of CVA - Continue statin   Dementia: - Delirium precautions   Seizure disorder, depression - Continue sertraline, keppra -Has history of TD thus rationale for Cymbalta prior to admission-resumed 8/3   Stage IIIa CKD:  -Currently better than that diagnosis would suggest. - Avoid nephrotoxins -Follow creatinine closely with IV diuresis -current eGFR >60   Acute hypokalemia: - Resolved   Anemia of chronic disease exacerbated by acute blood loss anemia due to hemoptysis (improving):  -Hgb stable at 8.8-possibly a degree of hemodilution from acute heart failure - Monitor intermittently-no further hemoptysis noted   Hypothyroidism:  -TSH 0.697 - Continue synthroid   Loose stool:  -Likely related to tube feeds with benign exam. - Continue rectal tube for now - Continue imodium with low suspicion for GI pathogen.  -Add bulking agent   Data Reviewed: Basic Metabolic Panel: Recent Labs  Lab 01/30/21 0314 02/01/21 0336 02/02/21 0816 02/04/21 0804  NA 138 139 135 139  K 4.1 4.2 3.9 3.7  CL 107 105 100 104  CO2 '25 26 26 27  ' GLUCOSE 147* 140* 127* 77  BUN 29* 31* 29* 28*  CREATININE 0.91 0.81 0.77 0.82  CALCIUM 8.2* 8.5* 8.7* 8.7*   Liver Function Tests: Recent Labs  Lab 02/01/21 0336  AST 17  ALT 14  ALKPHOS 55  BILITOT 0.5  PROT 5.7*  ALBUMIN 1.9*   No results for input(s): LIPASE, AMYLASE in the last 168 hours. No results for input(s): AMMONIA in the last 168 hours. CBC: Recent Labs  Lab 02/01/21 0336 02/02/21 0816 02/03/21 0406  WBC 6.6 7.7 8.3  NEUTROABS 3.9  --   --   HGB 8.0* 8.8* 8.2*  HCT 26.4* 28.6* 27.1*  MCV 93.6 92.0 93.4  PLT 210 248 246   Cardiac Enzymes: No results for input(s): CKTOTAL, CKMB, CKMBINDEX, TROPONINI in the last 168 hours. BNP (last 3 results) Recent Labs    11/21/20 1656 12/21/20 0953 01/03/21 1527  BNP 425.0* 453.0* 614.0*    ProBNP  (last 3 results) No results for input(s): PROBNP in the last 8760 hours.  CBG: Recent Labs  Lab 02/04/21 1118 02/04/21 1515 02/04/21 1923 02/04/21 2336 02/05/21 0316  GLUCAP 147* 170* 171* 205* 134*    Recent Results (from the past 240 hour(s))  Culture, Respiratory w Gram Stain     Status: None   Collection Time: 01/26/21  9:06 PM   Specimen: Tracheal Aspirate; Respiratory  Result Value Ref Range Status   Specimen Description TRACHEAL ASPIRATE  Final   Special Requests NONE  Final   Gram Stain   Final    FEW WBC PRESENT, PREDOMINANTLY PMN FEW GRAM POSITIVE RODS RARE GRAM POSITIVE COCCI IN PAIRS Performed at [facility], [place] [address], [place], [place] [address]    Culture MODERATE ENTEROBACTER CLOACAE  Final   Report Status 01/29/2021 FINAL  Final   Organism ID, Bacteria ENTEROBACTER CLOACAE  Final      Susceptibility   Enterobacter cloacae - MIC*    CEFAZOLIN >=64 RESISTANT Resistant     CEFEPIME <=0.12 SENSITIVE Sensitive  CEFTAZIDIME <=1 SENSITIVE Sensitive     CIPROFLOXACIN <=0.25 SENSITIVE Sensitive     GENTAMICIN <=1 SENSITIVE Sensitive     IMIPENEM 0.5 SENSITIVE Sensitive     TRIMETH/SULFA <=20 SENSITIVE Sensitive     PIP/TAZO <=4 SENSITIVE Sensitive     * MODERATE ENTEROBACTER CLOACAE     Scheduled Meds:  amiodarone  200 mg Per Tube Daily   atorvastatin  80 mg Per Tube Daily   chlorhexidine gluconate (MEDLINE KIT)  15 mL Mouth Rinse BID   Chlorhexidine Gluconate Cloth  6 each Topical Q0600   DULoxetine  30 mg Oral BID   feeding supplement (KATE FARMS STANDARD 1.4)  325 mL Per Tube QID   feeding supplement (PROSource TF)  45 mL Per Tube TID   free water  200 mL Per Tube Q6H   furosemide  20 mg Intravenous BID   gabapentin  100 mg Per Tube Q8H   heparin injection (subcutaneous)  5,000 Units Subcutaneous Q8H   insulin aspart  0-15 Units Subcutaneous Q4H   insulin glargine  20 Units Subcutaneous BID   levETIRAcetam  1,000 mg Per Tube BID    levothyroxine  175 mcg Per Tube Q0600   mouth rinse  15 mL Mouth Rinse 10 times per day   metoprolol tartrate  12.5 mg Per Tube BID   pantoprazole sodium  40 mg Per Tube Daily   sertraline  25 mg Per Tube Daily     Active Problems:   Acute respiratory failure with hypoxia and hypercapnia (HCC)   Acute on chronic respiratory failure with hypoxia and hypercapnia (HCC)   Hypercapnic respiratory failure (HCC)   Status post tracheostomy (HCC)   Encephalopathy acute   Hemoptysis   Tracheostomy dependent (HCC)   Physical deconditioning   Insulin dependent type 2 diabetes mellitus, controlled ([place])   Ventilator dependence ([place])   Consultants: PCCM Interventional radiology Video-assisted bronchoscopy  Procedures: Echocardiogram Tracheostomy tube PEG tube  Antibiotics: Cefepime 7/3 through 7/8 Azithromycin 7/3 through 7/7 Vancomycin 7/3, 7/4 Cefazolin 7/11 x 1 dose Cefepime 7/27 through 7/31   Time spent: 25 minutes    Erin Hearing ANP  [facility] 7 am - 330 pm/M-F for direct patient care and secure chat Please refer to Amion for contact info 33  days

## 2021-02-05 NOTE — Progress Notes (Signed)
Physical Therapy Treatment Patient Details Name: [name] MRN: [id number] DOB: 10-Sep-1950 Today's Date: 02/05/2021    History of Present Illness 69 y.o. female admitted 01/03/21 with acute on chronic respiratory failure with hypoxia. Intubated 7/3-present. 7/7: trachostomy placement. 7/27 occlusion of left lower lobe, trach replaced. PMHx:obesity, T2DM, chronic atrial fibrillation on Eliquis, dementia, diastolic CHF with chronic hypoxemia on 2 L nasal cannula, hypothyroidism, seizure disorder, CKD stage III, CAD with prior NSTEMI, dyslipidemia, and GERD.    PT Comments    Pt admitted with above diagnosis. Pt was able to stand to [facility] 2 x for over than 1 min each time with mod assist to power up and min to min guard to stand.  Pt much more alert today and tolerated therapy much better today.  Pt currently with functional limitations due to balance and endurance deficits. Pt will benefit from skilled PT to increase their independence and safety with mobility to allow discharge to the venue listed below.      Follow Up Recommendations  LTACH;Home health PT;Supervision/Assistance - 24 hour     Equipment Recommendations  Wheelchair (measurements PT);Wheelchair cushion (measurements PT);Other (comment) (hoyer lift)    Recommendations for Other Services       Precautions / Restrictions Precautions Precautions: Fall Precaution Comments: 40%  trach collar Restrictions Weight Bearing Restrictions: No    Mobility  Bed Mobility Overal bed mobility: Needs Assistance Bed Mobility: Rolling;Sidelying to Sit Rolling: +2 for physical assistance;Min assist Sidelying to sit: HOB elevated;Min assist       General bed mobility comments: Min +2 with assist from bedrails and pad from side to sit. Provided multimodal cues for hand placement    Transfers Overall transfer level: Needs assistance   Transfers: Sit to/from Stand Sit to Stand: +2 physical assistance;From elevated surface;Mod  assist;Min assist Stand pivot transfers: Total assist;+2 safety/equipment       General transfer comment: Obtained Stedy for sit to stand. Pt needed  mod assist of 2 initially to stand from bed to [facility]. Once on Stedy sitting, min assist to stand with pt standing first time for 1 1/2 min and second attempt 1 min 15 sec.  Pt much more alert today and standing statically once up with cues verbally only with pt smiling for her daughter to take a picture of her.  Moved pt to the chair in the [place].  Ambulation/Gait                 Stairs             Wheelchair Mobility    Modified Rankin (Stroke Patients Only)       Balance Overall balance assessment: Needs assistance Sitting-balance support: Bilateral upper extremity supported Sitting balance-Leahy Scale: Poor Sitting balance - Comments: Pt was able to sit on EOB with min guard assist   Standing balance support: Bilateral upper extremity supported;During functional activity Standing balance-Leahy Scale: Poor Standing balance comment: Relies on UE support for standing                            Cognition Arousal/Alertness: Awake/alert Behavior During Therapy: WFL for tasks assessed/performed Overall Cognitive Status: Impaired/Different from baseline Area of Impairment: Orientation;Following commands;Awareness                 Orientation Level: Disoriented to;Person;Time;Situation     Following Commands: Follows one step commands inconsistently;Follows one step commands with increased time   Awareness: Intellectual  General Comments: Pt intermittently responds to one-step commands with accuracy.      Exercises General Exercises - Lower Extremity Ankle Circles/Pumps: AROM;Both;15 reps;Supine Long Arc Quad: AROM;Seated;Both;10 reps    General Comments General comments (skin integrity, edema, etc.): VSS on 40% trach collar with sats >98%.      Pertinent Vitals/Pain Pain Assessment:  No/denies pain    Home Living                      Prior Function            PT Goals (current goals can now be found in the care plan section) Acute Rehab PT Goals Patient Stated Goal: To get stronger Progress towards PT goals: Progressing toward goals    Frequency    Min 3X/week      PT Plan Current plan remains appropriate    Co-evaluation              AM-PAC PT "6 Clicks" Mobility   Outcome Measure  Help needed turning from your back to your side while in a flat bed without using bedrails?: A Little Help needed moving from lying on your back to sitting on the side of a flat bed without using bedrails?: A Little Help needed moving to and from a bed to a chair (including a wheelchair)?: Total Help needed standing up from a chair using your arms (e.g., wheelchair or bedside chair)?: Total Help needed to walk in hospital room?: Total Help needed climbing 3-5 steps with a railing? : Total 6 Click Score: 10    End of Session Equipment Utilized During Treatment: Gait belt;Other (comment) (stedy) Activity Tolerance: Patient limited by fatigue Patient left: with call bell/phone within reach;in chair;with chair alarm set;with family/visitor present Nurse Communication: Mobility status;Need for lift equipment PT Visit Diagnosis: Unsteadiness on feet (R26.81);Muscle weakness (generalized) (M62.81);Difficulty in walking, not elsewhere classified (R26.2)     Time: 8101-7510 PT Time Calculation (min) (ACUTE ONLY): 24 min  Charges:  $Therapeutic Exercise: 8-22 mins $Therapeutic Activity: 8-22 mins                     Clarke Amburn M,PT Acute Rehab Services [phone] (pager)    Bevelyn Buckles 02/05/2021, 1:22 PM

## 2021-02-05 NOTE — Progress Notes (Signed)
RT note-Patient placed back to full support, daughter concerned that patient might be hallucinating, patient appears that she is tired at this time. Small amount of secretions, still thick old blood. Continue to monitor.

## 2021-02-05 NOTE — Progress Notes (Signed)
OT Cancellation Note  Patient Details Name: NIMSI MALES MRN: [id number] DOB: [birth year]   Cancelled Treatment:    Reason Eval/Treat Not Completed: RN preparing to complete dressing change. OT to check back as time allows.   Kallie Edward OTR/L Supplemental OT, Department of rehab services [phone]  Dorina Ribaudo R H. 02/05/2021, 10:39 AM

## 2021-02-06 LAB — BASIC METABOLIC PANEL
Anion gap: 6 (ref 5–15)
BUN: 27 mg/dL — ABNORMAL HIGH (ref 8–23)
CO2: 27 mmol/L (ref 22–32)
Calcium: 8.8 mg/dL — ABNORMAL LOW (ref 8.9–10.3)
Chloride: 107 mmol/L (ref 98–111)
Creatinine, Ser: 0.79 mg/dL (ref 0.44–1.00)
GFR, Estimated: >60 mL/min (ref >60)
Glucose, Bld: 83 mg/dL (ref 70–99)
Potassium: 4.3 mmol/L (ref 3.5–5.1)
Sodium: 140 mmol/L (ref 135–145)

## 2021-02-06 LAB — GLUCOSE, CAPILLARY
Glucose-Capillary: 120 mg/dL — ABNORMAL HIGH (ref 70–99)
Glucose-Capillary: 130 mg/dL — ABNORMAL HIGH (ref 70–99)
Glucose-Capillary: 155 mg/dL — ABNORMAL HIGH (ref 70–99)
Glucose-Capillary: 206 mg/dL — ABNORMAL HIGH (ref 70–99)
Glucose-Capillary: 214 mg/dL — ABNORMAL HIGH (ref 70–99)
Glucose-Capillary: 222 mg/dL — ABNORMAL HIGH (ref 70–99)
Glucose-Capillary: 51 mg/dL — ABNORMAL LOW (ref 70–99)
Glucose-Capillary: 75 mg/dL (ref 70–99)

## 2021-02-06 MED ORDER — DEXTROSE 50 % IV SOLN
INTRAVENOUS | Status: AC
  Administered 2021-02-06: 50 mL
  Filled 2021-02-06: qty 50

## 2021-02-06 NOTE — Progress Notes (Addendum)
[facility] PROGRESS NOTE  [name] VOZ:[id number] DOB: 10-24-1950 DOA: 01/03/2021 PCP: Lemmie Evens, MD   Status: Remains inpatient appropriate because:Unsafe d/c plan, IV treatments appropriate due to intensity of illness or inability to take PO, and Inpatient level of care appropriate due to severity of illness  Dispo: The patient is from: Home              Anticipated d/c is to: Home once generator for electrical back up for nocturnal vent installed; [facility]-also HH will not accept trach < 30 days post insertion; also needs to prove can tolerate Eliquis w/o bleeding as well              Patient currently is not medically stable to d/c.   Difficult to place patient No    Level of care: ICU 2/2 friable pulmonary mucosa/new trach/requirement for nocturnal ventilator  Code Status: Full Family Communication: Daughter at bedside on 8/2 DVT prophylaxis: Subcu heparin COVID vaccination status: Not documented  HPI: 70 yo female who presented to [facility] on 7/03 with dyspnea, and altered mental status. Had admission from 12/21/20 to 12/30/20 for respiratory failure and CHF exacerbation.Transferred to [facility]. SpO2 65% and elevated PCO2.  Failed Bipap and required intubation. Course since then complicated by prolonged mechanical ventilation requiring tracheostomy 7/7. She has been managed by PCCM for 4 weeks, deemed to require home vent which is delayed by need for generator at home. This is being arranged. She was transferred to hospitalist service on 02/01/2021.   Significant events: 7/3  chronic HCRF from OHS and restrictive lung dz as well as deconditioning-->progression -->decompensated HFpEF and cor pulmonale-->worsening metabolic enceph from HCRF-->PNA --> acute on chronic resp failure. Admitted. Intubated. Ceftriaxone 7/3 x 1. Azith and Cefepime started. 7/4-7/5 Hypotension overnight. A line placed. 569m LR bolus. [place]ed on [place] 7/6 Hgb 6.7  (7.4), CT A/P completed to r/o RP bleed (negative), showed mild peripancreatic stranding. Lipase slightly elevated. 1U PRBCs ordered. 7/7 Tracheostomy placed, heparin gtt held for procedure. 7/8 Increased agitation with Propofol wean, increased HR, uptrending pressor requirements. Cortrak placed. Azith and cefepime stopped. 7/11 PEG tube placed, tolerating PSV 7/12 ATC trials started. Tubefeeds started/resumed 7/13 solucortef decreased to daily. Midodrine stopped. Heparin gtt stopped. Stated back on DOAC. PANDA removed 7/14: sat at side of bed w/ max assist for 15 minutes. Fatigued quickly. PT recommending LTAC setting 7/15: Tolerated trach collar yesterday 7/16: TCT x 2 hours 7/18 hgb 6.8 received 1 unit PRBC. LTACH appeal denied 7/22 Tolerating brief periods of trach collar and PMV, Periods of emesis >> TFs held, Bright red blood noted by RT 7/23 left atelectasis, bronchoscopy showing inspissated clot occluding distal trachea, needed cryo probe to remove clot down to left mainstem 7/20 4 repeat bronchoscopy with cryoprobe to remove residual clot left upper lobe and lower lobe, oozing of underlying mucosa, distal clot remains 7/23 atx due to clot. Trach replaced with ETT for purpose of bronchoscopy. Inspissated clot suctioned. No obvious proximal bleeding source identified. Required TXA nebs and Cryoprobe 7/25 still w/ bloody trach secretions. Tolerating CPAP 7/26 hgb down 6.9 1 u PRBC transfused; complete white out of left hemithorax (again). Underwent bronch w/ cryoprobe for removal of clots. large clot in the left lower lobe as well as friable mucosa throughout the left tracheobronchial tree. Low grade temp. Concern for possible VAP/HCAP so sputum sent and Cefepime started. 7/27: Trach placement 7/29 tolerating PSV  7/30 ATC alt w/ nocturnal vent  8/2 increased WOB and has required more frequent vent support in PRVC mode  Subjective: No acute issues or events overnight  Objective: Vitals:    02/06/21 0600 02/06/21 0700  BP: 138/64 (!) 117/96  Pulse: 75 71  Resp: (!) 23 (!) 24  Temp:    SpO2: 99% 100%    Intake/Output Summary (Last 24 hours) at 02/06/2021 0718 Last data filed at 02/06/2021 0500 Gross per 24 hour  Intake --  Output 850 ml  Net -850 ml    Filed Weights   02/01/21 0500 02/02/21 0500 02/05/21 0500  Weight: 94.6 kg 94.6 kg 91.5 kg    Exam:  Constitutional: Appears chronically ill but in good spirits, no acute distress Respiratory: Midline tracheostomy  #8.0 cuffed;  PRVC mode FiO2 30 %, rate 22, VT 510, PEEP 5 HS and TC 35% FIO2 during the day; lung sounds are clear anteriorly but coarse.  Tiny amount of blood-tinged drainage noted in ventilator tubing.  No increased work of breathing Cardiovascular: AF, did have some transient bradycardia while sleeping with rates in the upper 50s but remained normotensive.  Currently rate controlled, no peripheral edema, normal heart sound Abdomen: PEG for bolus feeds-nontender nondistended.  Normoactive bowel sounds.  Rectal tube has been removed. Skin: documented stage I sacral pressure injury as well as lower right arm skin tear and groin MASD Neurologic: CN 2-12 grossly intact. Sensation intact,  Strength 3/5 x all 4 extremities.  Psychiatric: Alert and oriented x3.  Able to mouth words and does have some successful phonation.  Pleasant affect.  Smiles frequently.   Assessment/Plan:  Acute on chronic hypoxemic and hypercarbic respiratory failure: Multifactorial including restrictive lung disease due to kyphoscoliosis, OHS/OSA, tracheobronchitis leading to hemoptysis. - Per PCCM -Back on full support per RT -Cannot rule out amiodarone lung injury - pending    Acute on chronic combined CHF LV systolic dysfunction EF 45 to 50% Moderate RV systolic dysfunction Severe pulmonary hypertension 60 mmHg Mild to moderate MR and moderate TR:  -Volume status improved.  Exacerbated by underlying hypoalbuminemia -Cont BB;  not on ARB/ACE inhibitor secondary to hypotension -8/5 urine output with IV Lasix has decreased to 1400 cc in the past 24 hours and patient currently in a negative balance.  We will transition to Lasix per tube 40 mg daily -Renal function normal with mildly elevated BUN-follow electrolytes every 48 hours -continue strict I's/O  Permanent atrial fibrillation - Continue metoprolol, amiodarone -Resting tachycardia likely related to deconditioning;; while sleeping patient had bradycardia into the high 50s which is not unexpected-continue to trend -Full dose anticoagulation on hold due to anemia, hemoptysis requiring bronch which showed friable mucosa. (Preadmission was on Eliquis) -8/4 no further hemoptysis  Diabetes mellitus 2 on long-term insulin; uncontrolled with recurrent hypoglycemia -A1c 7.6% - discontinue HS long-acting insulin given recurrent early morning hypoglycemic events -Continue SSI  Abdominal pain with nausea -No further nausea or abdominal discomfort -Possible related to bolus feeding-monitor for correlation -Abdominal x-ray revealed diffuse gaseous distention of the colon with gas up to the level of the rectum with decreased small bowel gas.  Otherwise nonobstructive.  Severe physical deconditioning: -Not candidate for LTAC given chronicity of debility -Plan is to discharge home-CM working on home health options and discussing in detail with daughter -Continue PT and OT  Dysphagia s/p PEG: - Continue bolus tube feeds, free water.   History of CVA - Continue statin   Dementia: - Delirium precautions   Seizure disorder, depression - Continue sertraline, keppra -Has  history of TD thus rationale for Cymbalta prior to admission-resumed 8/3   Stage IIIa CKD:  -Currently better than that diagnosis would suggest. - Avoid nephrotoxins -Follow creatinine closely with IV diuresis -current eGFR >60   Acute hypokalemia: - Resolved   Anemia of chronic disease  exacerbated by acute blood loss anemia due to hemoptysis (improving):  -Hgb stable at 8.8-possibly a degree of hemodilution from acute heart failure - Monitor intermittently-no further hemoptysis noted   Hypothyroidism:  -TSH 0.697 - Continue synthroid   Loose stool:  -Likely related to tube feeds with benign exam. - Continue rectal tube for now - Continue imodium with low suspicion for GI pathogen.  -Add bulking agent   Data Reviewed: Basic Metabolic Panel: Recent Labs  Lab 02/01/21 0336 02/02/21 0816 02/04/21 0804 02/06/21 0310  NA 139 135 139 140  K 4.2 3.9 3.7 4.3  CL 105 100 104 107  CO2 _0 GLUCOSE 140* 127* 77 83  BUN 31* 29* 28* 27*  CREATININE 0.81 0.77 0.82 0.79  CALCIUM 8.5* 8.7* 8.7* 8.8*    Liver Function Tests: Recent Labs  Lab 02/01/21 0336  AST 17  ALT 14  ALKPHOS 55  BILITOT 0.5  PROT 5.7*  ALBUMIN 1.9*    No results for input(s): LIPASE, AMYLASE in the last 168 hours. No results for input(s): AMMONIA in the last 168 hours. CBC: Recent Labs  Lab 02/01/21 0336 02/02/21 0816 02/03/21 0406  WBC 6.6 7.7 8.3  NEUTROABS 3.9  --   --   HGB 8.0* 8.8* 8.2*  HCT 26.4* 28.6* 27.1*  MCV 93.6 92.0 93.4  PLT 210 248 246    Cardiac Enzymes: No results for input(s): CKTOTAL, CKMB, CKMBINDEX, TROPONINI in the last 168 hours. BNP (last 3 results) Recent Labs    11/21/20 1656 12/21/20 0953 01/03/21 1527  BNP 425.0* 453.0* 614.0*     ProBNP (last 3 results) No results for input(s): PROBNP in the last 8760 hours.  CBG: Recent Labs  Lab 02/05/21 1125 02/05/21 1522 02/05/21 2000 02/05/21 2344 02/06/21 0347  GLUCAP 123* 180* 181* 155* 75     No results found for this or any previous visit (from the past 240 hour(s)).    Scheduled Meds:  amiodarone  200 mg Per Tube Daily   atorvastatin  80 mg Per Tube Daily   chlorhexidine gluconate (MEDLINE KIT)  15 mL Mouth Rinse BID   Chlorhexidine Gluconate Cloth  6 each Topical Q0600    DULoxetine  30 mg Oral BID   feeding supplement (KATE FARMS STANDARD 1.4)  325 mL Per Tube QID   feeding supplement (PROSource TF)  45 mL Per Tube TID   free water  200 mL Per Tube Q6H   furosemide  40 mg Per Tube Daily   gabapentin  100 mg Per Tube Q8H   heparin injection (subcutaneous)  5,000 Units Subcutaneous Q8H   insulin aspart  0-15 Units Subcutaneous Q4H   insulin glargine-yfgn  10 Units Subcutaneous QHS   insulin glargine-yfgn  15 Units Subcutaneous Daily   levETIRAcetam  1,000 mg Per Tube BID   levothyroxine  175 mcg Per Tube Q0600   mouth rinse  15 mL Mouth Rinse 10 times per day   metoprolol tartrate  12.5 mg Per Tube BID   pantoprazole sodium  40 mg Per Tube Daily   sertraline  25 mg Per Tube Daily     Active Problems:   Acute respiratory failure with hypoxia and  hypercapnia (HCC)   Acute on chronic respiratory failure with hypoxia and hypercapnia (HCC)   Hypercapnic respiratory failure (HCC)   Status post tracheostomy ([place])   Encephalopathy acute   Hemoptysis   Tracheostomy dependent (HCC)   Physical deconditioning   Insulin dependent type 2 diabetes mellitus, controlled ([place])   Ventilator dependence ([place])   Consultants: PCCM Interventional radiology Video-assisted bronchoscopy  Procedures: Echocardiogram Tracheostomy tube PEG tube  Antibiotics: Cefepime 7/3 through 7/8 Azithromycin 7/3 through 7/7 Vancomycin 7/3, 7/4 Cefazolin 7/11 x 1 dose Cefepime 7/27 through 7/31   Time spent: 25 minutes    Little Ishikawa ANP  [facility] 7 am - 330 pm/M-F for direct patient care and secure chat Please refer to Amion for contact info 34  days

## 2021-02-06 NOTE — Progress Notes (Signed)
   02/06/21 2300  Vent Select  Invasive or Noninvasive Invasive  Adult Vent Y  Tracheostomy Shiley Flexible 8 mm Cuffed  Placement Date/Time: 01/27/21 1200   Placed By: (c) ICU physician  Brand: Shiley Flexible  Size (mm): 8 mm  Style: Cuffed  Status Secured  Site Assessment Clean;Dry  Site Care Dried;Protective barrier to skin;Dressing applied  Inner Cannula Care (S)  Cleansed/dried;Changed/new  Ties Assessment Clean;Dry;Secure  Cuff Pressure (cm H2O) Green OR 18-26 CmH2O  Tracheostomy Equipment at bedside Yes and checklist posted at head of bed  Adult Ventilator Settings  Vent Type Servo i  Humidity HME  Vent Mode PRVC  Vt Set 510 mL  Set Rate 22 bmp  FiO2 (%) 40 %  I Time 0.9 Sec(s)  PEEP 5 cmH20  Adult Ventilator Measurements  Peak Airway Pressure 22 L/min  Mean Airway Pressure 11 cmH20  Resp Rate Spontaneous 12 br/min  Resp Rate Total 34 br/min  Exhaled Vt 517 mL  Measured Ve 8 mL  I:E Ratio Measured 1:1.9  Auto PEEP 0 cmH20  Total PEEP 5 cmH20  SpO2 94 %  Adult Ventilator Alarms  Alarms On Y  Ve High Alarm 21 L/min  Ve Low Alarm 4 L/min  Resp Rate High Alarm 38 br/min  Resp Rate Low Alarm 10  PEEP Low Alarm 3 cmH2O  Press High Alarm 40 cmH2O  Press Low Alarm 2 cmH2O  T Apnea 20 sec(s)  Breath Sounds  Bilateral Breath Sounds Rhonchi  Airway Suctioning/Secretions  Suction Type Tracheal  Suction Device  Catheter  Secretion Amount Moderate  Secretion Color Tan;White  Secretion Consistency Thick  Suction Tolerance Tolerated well  Suctioning Adverse Effects None  Placed pt. On vent to rest overnight .

## 2021-02-07 LAB — GLUCOSE, CAPILLARY
Glucose-Capillary: 148 mg/dL — ABNORMAL HIGH (ref 70–99)
Glucose-Capillary: 103 mg/dL — ABNORMAL HIGH (ref 70–99)
Glucose-Capillary: 56 mg/dL — ABNORMAL LOW (ref 70–99)
Glucose-Capillary: 131 mg/dL — ABNORMAL HIGH (ref 70–99)
Glucose-Capillary: 218 mg/dL — ABNORMAL HIGH (ref 70–99)
Glucose-Capillary: 247 mg/dL — ABNORMAL HIGH (ref 70–99)
Glucose-Capillary: 204 mg/dL — ABNORMAL HIGH (ref 70–99)

## 2021-02-07 LAB — MRSA NEXT GEN BY PCR, NASAL: MRSA by PCR Next Gen: NOT DETECTED

## 2021-02-07 MED ORDER — CHLORHEXIDINE GLUCONATE CLOTH 2 % EX PADS
6.0000 | MEDICATED_PAD | Freq: Every day | CUTANEOUS | Status: DC
  Administered 2021-02-07 – 2021-02-14 (×8): 6 via TOPICAL

## 2021-02-07 MED ORDER — DEXTROSE 50 % IV SOLN
25.0000 mL | Freq: Once | INTRAVENOUS | Status: AC
  Administered 2021-02-07: 25 mL via INTRAVENOUS

## 2021-02-07 MED ORDER — DEXTROSE 50 % IV SOLN
INTRAVENOUS | Status: AC
  Administered 2021-02-07: 50 mL
  Filled 2021-02-07: qty 50

## 2021-02-07 MED ORDER — INSULIN GLARGINE-YFGN 100 UNIT/ML ~~LOC~~ SOLN
10.0000 [IU] | Freq: Every day | SUBCUTANEOUS | Status: DC
  Administered 2021-02-08 – 2021-02-09 (×2): 10 [IU] via SUBCUTANEOUS
  Filled 2021-02-07 (×2): qty 0.1

## 2021-02-07 NOTE — Progress Notes (Signed)
[facility] PROGRESS NOTE  [name] VOZ:[id number] DOB: 10-24-1950 DOA: 01/03/2021 PCP: Lemmie Evens, MD   Status: Remains inpatient appropriate because:Unsafe d/c plan, IV treatments appropriate due to intensity of illness or inability to take PO, and Inpatient level of care appropriate due to severity of illness  Dispo: The patient is from: Home              Anticipated d/c is to: Home once generator for electrical back up for nocturnal vent installed; Possible [facility]-also HH will not accept trach < 30 days post insertion; also needs to prove can tolerate Eliquis w/o bleeding as well              Patient currently is not medically stable to d/c.   Difficult to place patient No    Level of care: ICU 2/2 friable pulmonary mucosa/new trach/requirement for nocturnal ventilator  Code Status: Full Family Communication: Daughter at bedside on 8/2 DVT prophylaxis: Subcu heparin COVID vaccination status: Not documented  HPI: 70 yo female who presented to [facility] on 7/03 with dyspnea, and altered mental status. Had admission from 12/21/20 to 12/30/20 for respiratory failure and CHF exacerbation.Transferred to [facility]. SpO2 65% and elevated PCO2.  Failed Bipap and required intubation. Course since then complicated by prolonged mechanical ventilation requiring tracheostomy 7/7. She has been managed by PCCM for 4 weeks, deemed to require home vent which is delayed by need for generator at home. This is being arranged. She was transferred to hospitalist service on 02/01/2021.   Significant events: 7/3  chronic HCRF from [facility]HS and restrictive lung dz as well as deconditioning-->progression -->decompensated HFpEF and cor pulmonale-->worsening metabolic enceph from HCRF-->PNA --> acute on chronic resp failure. Admitted. Intubated. Ceftriaxone 7/3 x 1. Azith and Cefepime started. 7/4-7/5 Hypotension overnight. A line placed. 569m LR bolus. Started on [place] 7/6 Hgb 6.7  (7.4), CT A/P completed to r/o RP bleed (negative), showed mild peripancreatic stranding. Lipase slightly elevated. 1U PRBCs ordered. 7/7 Tracheostomy placed, heparin gtt held for procedure. 7/8 Increased agitation with Propofol wean, increased HR, uptrending pressor requirements. Cortrak placed. Azith and cefepime stopped. 7/11 PEG tube placed, tolerating PSV 7/12 ATC trials started. Tubefeeds started/resumed 7/13 solucortef decreased to daily. Midodrine stopped. Heparin gtt stopped. Stated back on DOAC. PANDA removed 7/14: sat at side of bed w/ max assist for 15 minutes. Fatigued quickly. PT recommending LTAC setting 7/15: Tolerated trach collar yesterday 7/16: TCT x 2 hours 7/18 hgb 6.8 received 1 unit PRBC. LTACH appeal denied 7/22 Tolerating brief periods of trach collar and PMV, Periods of emesis >> TFs held, Bright red blood noted by RT 7/23 left atelectasis, bronchoscopy showing inspissated clot occluding distal trachea, needed cryo probe to remove clot down to left mainstem 7/20 4 repeat bronchoscopy with cryoprobe to remove residual clot left upper lobe and lower lobe, oozing of underlying mucosa, distal clot remains 7/23 atx due to clot. Trach replaced with ETT for purpose of bronchoscopy. Inspissated clot suctioned. No obvious proximal bleeding source identified. Required TXA nebs and Cryoprobe 7/25 still w/ bloody trach secretions. Tolerating CPAP 7/26 hgb down 6.9 1 u PRBC transfused; complete white out of left hemithorax (again). Underwent bronch w/ cryoprobe for removal of clots. large clot in the left lower lobe as well as friable mucosa throughout the left tracheobronchial tree. Low grade temp. Concern for possible VAP/HCAP so sputum sent and Cefepime started. 7/27: Trach placement 7/29 tolerating PSV  7/30 ATC alt w/ nocturnal vent  8/2 increased WOB and has required more frequent vent support in PRVC mode 8/6-8/7 able to tolerate trach collar for large portion of the  day  Subjective: No acute issues or events overnight  Objective: Vitals:   02/07/21 0500 02/07/21 0600  BP: (!) 89/59 (!) 88/50  Pulse: (!) 59 (!) 57  Resp: (!) 22 (!) 22  Temp:    SpO2: 97% 96%    Intake/Output Summary (Last 24 hours) at 02/07/2021 0724 Last data filed at 02/07/2021 0424 Gross per 24 hour  Intake --  Output 1200 ml  Net -1200 ml    Filed Weights   02/02/21 0500 02/05/21 0500 02/07/21 0500  Weight: 94.6 kg 91.5 kg 93.3 kg    Exam:  Constitutional: Appears chronically ill but in good spirits, no acute distress Respiratory: Midline tracheostomy  #8.0 cuffed;  PRVC mode FiO2 30 %, rate 22, VT 510, PEEP 5 HS and TC 35% FIO2 during the day; lung sounds are clear anteriorly but coarse.  Tiny amount of blood-tinged drainage noted in ventilator tubing.  No increased work of breathing Cardiovascular: AF, did have some transient bradycardia while sleeping with rates in the upper 50s but remained normotensive.  Currently rate controlled, no peripheral edema, normal heart sound Abdomen: PEG for bolus feeds-nontender nondistended.  Normoactive bowel sounds.  Rectal tube has been removed. Skin: documented stage I sacral pressure injury as well as lower right arm skin tear and groin MASD Neurologic: CN 2-12 grossly intact. Sensation intact,  Strength 3/5 x all 4 extremities.  Psychiatric: Alert and oriented x3.  Able to mouth words and does have some successful phonation.  Pleasant affect.  Smiles frequently.   Assessment/Plan:  Acute on chronic hypoxemic and hypercarbic respiratory failure: Multifactorial including restrictive lung disease due to kyphoscoliosis, OHS/OSA, tracheobronchitis leading to hemoptysis. - Per PCCM -Back on full support per RT -Cannot rule out amiodarone lung injury - pending    Acute on chronic combined CHF LV systolic dysfunction EF 45 to 44% Moderate RV systolic dysfunction Severe pulmonary hypertension 60 mmHg Mild to moderate MR and  moderate TR:  -Volume status improved.  Exacerbated by underlying hypoalbuminemia -Cont BB; not on ARB/ACE inhibitor secondary to hypotension -8/5 urine output with IV Lasix has decreased to 1400 cc in the past 24 hours and patient currently in a negative balance.  We will transition to Lasix per tube 40 mg daily -Renal function normal with mildly elevated BUN-follow electrolytes every 48 hours -continue strict I's/O  Permanent atrial fibrillation - Continue metoprolol, amiodarone -Resting tachycardia likely related to deconditioning;; while sleeping patient had bradycardia into the high 50s which is not unexpected-continue to trend -Full dose anticoagulation on hold due to anemia, hemoptysis requiring bronch which showed friable mucosa. (Preadmission was on Eliquis) -8/4 no further hemoptysis  Diabetes mellitus 2 on long-term insulin; uncontrolled with recurrent hypoglycemia -A1c 7.6% - discontinue HS long-acting insulin given recurrent early morning hypoglycemic events -Continue SSI  Abdominal pain with nausea -No further nausea or abdominal discomfort -Possible related to bolus feeding-monitor for correlation -Abdominal x-ray revealed diffuse gaseous distention of the colon with gas up to the level of the rectum with decreased small bowel gas.  Otherwise nonobstructive.  Severe physical deconditioning: -Not candidate for LTAC given chronicity of debility -Plan is to discharge home-CM working on home health options and discussing in detail with daughter -Continue PT and OT  Dysphagia s/p PEG: - Continue bolus tube feeds, free water.   History of CVA - Continue statin  Dementia: - Delirium precautions   Seizure disorder, depression - Continue sertraline, keppra -Has history of TD thus rationale for Cymbalta prior to admission-resumed 8/3   Stage IIIa CKD:  -Currently better than that diagnosis would suggest. - Avoid nephrotoxins -Follow creatinine closely with IV  diuresis -current eGFR >60   Acute hypokalemia: - Resolved   Anemia of chronic disease exacerbated by acute blood loss anemia due to hemoptysis (improving):  -Hgb stable at 8.8-possibly a degree of hemodilution from acute heart failure - Monitor intermittently-no further hemoptysis noted   Hypothyroidism:  -TSH 0.697 - Continue synthroid   Loose stool:  -Likely related to tube feeds with benign exam. - Continue rectal tube for now - Continue imodium with low suspicion for GI pathogen.  -Add bulking agent   Data Reviewed: Basic Metabolic Panel: Recent Labs  Lab 02/01/21 0336 02/02/21 0816 02/04/21 0804 02/06/21 0310  NA 139 135 139 140  K 4.2 3.9 3.7 4.3  CL 105 100 104 107  CO2 $Re'26 26 27 27  'ByG$ GLUCOSE 140* 127* 77 83  BUN 31* 29* 28* 27*  CREATININE 0.81 0.77 0.82 0.79  CALCIUM 8.5* 8.7* 8.7* 8.8*    Liver Function Tests: Recent Labs  Lab 02/01/21 0336  AST 17  ALT 14  ALKPHOS 55  BILITOT 0.5  PROT 5.7*  ALBUMIN 1.9*    No results for input(s): LIPASE, AMYLASE in the last 168 hours. No results for input(s): AMMONIA in the last 168 hours. CBC: Recent Labs  Lab 02/01/21 0336 02/02/21 0816 02/03/21 0406  WBC 6.6 7.7 8.3  NEUTROABS 3.9  --   --   HGB 8.0* 8.8* 8.2*  HCT 26.4* 28.6* 27.1*  MCV 93.6 92.0 93.4  PLT 210 248 246    Cardiac Enzymes: No results for input(s): CKTOTAL, CKMB, CKMBINDEX, TROPONINI in the last 168 hours. BNP (last 3 results) Recent Labs    11/21/20 1656 12/21/20 0953 01/03/21 1527  BNP 425.0* 453.0* 614.0*     ProBNP (last 3 results) No results for input(s): PROBNP in the last 8760 hours.  CBG: Recent Labs  Lab 02/06/21 1111 02/06/21 1533 02/06/21 1939 02/06/21 2301 02/07/21 0327  GLUCAP 120* 214* 222* 206* 148*     No results found for this or any previous visit (from the past 240 hour(s)).    Scheduled Meds:  amiodarone  200 mg Per Tube Daily   atorvastatin  80 mg Per Tube Daily   chlorhexidine  gluconate (MEDLINE KIT)  15 mL Mouth Rinse BID   Chlorhexidine Gluconate Cloth  6 each Topical Daily   DULoxetine  30 mg Oral BID   feeding supplement (KATE FARMS STANDARD 1.4)  325 mL Per Tube QID   feeding supplement (PROSource TF)  45 mL Per Tube TID   free water  200 mL Per Tube Q6H   furosemide  40 mg Per Tube Daily   gabapentin  100 mg Per Tube Q8H   heparin injection (subcutaneous)  5,000 Units Subcutaneous Q8H   insulin aspart  0-15 Units Subcutaneous Q4H   insulin glargine-yfgn  15 Units Subcutaneous Daily   levETIRAcetam  1,000 mg Per Tube BID   levothyroxine  175 mcg Per Tube Q0600   mouth rinse  15 mL Mouth Rinse 10 times per day   metoprolol tartrate  12.5 mg Per Tube BID   pantoprazole sodium  40 mg Per Tube Daily   sertraline  25 mg Per Tube Daily     Active Problems:   Acute  respiratory failure with hypoxia and hypercapnia (HCC)   Acute on chronic respiratory failure with hypoxia and hypercapnia (HCC)   Hypercapnic respiratory failure (HCC)   Status post tracheostomy ([place])   Encephalopathy acute   Hemoptysis   Tracheostomy dependent (HCC)   Physical deconditioning   Insulin dependent type 2 diabetes mellitus, controlled ([place])   Ventilator dependence ([place])   Consultants: PCCM Interventional radiology Video-assisted bronchoscopy  Procedures: Echocardiogram Tracheostomy tube PEG tube  Antibiotics: Cefepime 7/3 through 7/8 Azithromycin 7/3 through 7/7 Vancomycin 7/3, 7/4 Cefazolin 7/11 x 1 dose Cefepime 7/27 through 7/31   Time spent: 25 minutes    Little Ishikawa ANP  [facility] 7 am - 330 pm/M-F for direct patient care and secure chat Please refer to Amion for contact info 35  days

## 2021-02-08 ENCOUNTER — Inpatient Hospital Stay (HOSPITAL_COMMUNITY): Payer: PPO

## 2021-02-08 DIAGNOSIS — G4733 Obstructive sleep apnea (adult) (pediatric): Secondary | ICD-10-CM | POA: Diagnosis not present

## 2021-02-08 DIAGNOSIS — E662 Morbid (severe) obesity with alveolar hypoventilation: Secondary | ICD-10-CM

## 2021-02-08 DIAGNOSIS — Z930 Tracheostomy status: Secondary | ICD-10-CM | POA: Diagnosis not present

## 2021-02-08 DIAGNOSIS — Z9911 Dependence on respirator [ventilator] status: Secondary | ICD-10-CM

## 2021-02-08 DIAGNOSIS — J9621 Acute and chronic respiratory failure with hypoxia: Secondary | ICD-10-CM | POA: Diagnosis not present

## 2021-02-08 LAB — GLUCOSE, CAPILLARY
Glucose-Capillary: 32 mg/dL — CL (ref 70–99)
Glucose-Capillary: 139 mg/dL — ABNORMAL HIGH (ref 70–99)
Glucose-Capillary: 124 mg/dL — ABNORMAL HIGH (ref 70–99)
Glucose-Capillary: 164 mg/dL — ABNORMAL HIGH (ref 70–99)
Glucose-Capillary: 247 mg/dL — ABNORMAL HIGH (ref 70–99)
Glucose-Capillary: 208 mg/dL — ABNORMAL HIGH (ref 70–99)
Glucose-Capillary: 130 mg/dL — ABNORMAL HIGH (ref 70–99)

## 2021-02-08 LAB — BASIC METABOLIC PANEL
Anion gap: 8 (ref 5–15)
BUN: 29 mg/dL — ABNORMAL HIGH (ref 8–23)
CO2: 23 mmol/L (ref 22–32)
Calcium: 8.4 mg/dL — ABNORMAL LOW (ref 8.9–10.3)
Chloride: 105 mmol/L (ref 98–111)
Creatinine, Ser: 0.81 mg/dL (ref 0.44–1.00)
GFR, Estimated: >60 mL/min (ref >60)
Glucose, Bld: 154 mg/dL — ABNORMAL HIGH (ref 70–99)
Potassium: 4.3 mmol/L (ref 3.5–5.1)
Sodium: 136 mmol/L (ref 135–145)

## 2021-02-08 MED ORDER — FREE WATER
100.0000 mL | Freq: Four times a day (QID) | Status: DC
  Administered 2021-02-08 – 2021-02-15 (×27): 100 mL

## 2021-02-08 MED ORDER — ONDANSETRON HCL 4 MG/2ML IJ SOLN
4.0000 mg | Freq: Four times a day (QID) | INTRAMUSCULAR | Status: DC
  Administered 2021-02-08 – 2021-02-09 (×3): 4 mg via INTRAVENOUS
  Filled 2021-02-08 (×3): qty 2

## 2021-02-08 MED ORDER — GERHARDT'S BUTT CREAM
1.0000 "application " | TOPICAL_CREAM | Freq: Two times a day (BID) | CUTANEOUS | Status: DC
  Administered 2021-02-08 – 2021-02-14 (×12): 1 via TOPICAL
  Filled 2021-02-08: qty 1

## 2021-02-08 NOTE — Progress Notes (Addendum)
NAME:  TERRACE CHIEM, MRN:  [id number], DOB:  [birth year], LOS: 36 ADMISSION DATE:  01/03/2021, CONSULTATION DATE:  7/3 REFERRING MD:  EDP/APH, CHIEF COMPLAINT:  dyspnea   History of Present Illness:  70 yo female presented to [facility] on 7/03 with dyspnea, and altered mental status.    Had admission from 12/21/20 to 12/30/20 for respiratory failure and CHF exacerbation.Transferred to [facility]. SpO2 65% and elevated PCO2.  Failed Bipap and required intubation. Course since then complicated by prolonged mechanical ventilation requiring tracheostomy 7/7  Pertinent  Medical History  Kyphoscoliosis, Restrictive lung disease, Chronic respiratory failure on 2 liters, DM type 2, Chronic a fib on eliquis, Dementia, Chronic combined CHF, Hypothyroidism, Seizures, CKD 3a, CAD s/p NSTEMI, HLD, GERD  Significant Hospital Events: Including procedures, antibiotic start and stop dates in addition to other pertinent events   7/3  chronic HCRF from OHS and restrictive lung dz as well as deconditioning-->progression -->decompensated HFpEF and cor pulmonale-->worsening metabolic enceph from HCRF-->PNA --> acute on chronic resp failure. Admitted. Intubated. Ceftriaxone 7/3 x 1. Azith and Cefepime started. 7/4-7/5 Hypotension overnight. A line placed. LR bolus. Started on NE. RUE PICC placed 7/6 Hgb 6.7 (7.4), CT A/P completed to r/o RP bleed (negative), showed mild peripancreatic stranding. Lipase slightly elevated. 1U PRBCs ordered. 7/7 Tracheostomy placed, heparin gtt held for procedure. 7/8 Increased agitation with Propofol wean, increased HR, uptrending pressor requirements. Cortrak placed. Azith and cefepime stopped. 7/11 PEG tube placed, tolerating PSV 7/12 ATC trials started. Tubefeeds started/resumed 7/13 solucortef decreased to daily. Midodrine stopped. Heparin gtt stopped. Stated back on DOAC. PANDA removed 7/14: sat at side of bed w/ max assist for 15 minutes. Fatigued quickly. PT recommending LTAC  setting 7/15: Tolerated trach collar yesterday 7/16: TCT x 2 hours 7/18 hgb 6.8 received 1 unit PRBC. LTACH appeal denied 7/22 Tolerating brief periods of trach collar and PMV, Periods of emesis >> TFs held, Bright red blood noted by RT 7/23 left atelectasis, bronchoscopy showing inspissated clot occluding distal trachea, needed cryo probe to remove clot down to left mainstem 7/20 4 repeat bronchoscopy with cryoprobe to remove residual clot left upper lobe and lower lobe, oozing of underlying mucosa, distal clot remains 7/23 atx due to clot. Trach replaced with ETT for purpose of bronchoscopy. Inspissated clot suctioned. No obvious proximal bleeding source identified. Required TXA nebs and Cryoprobe 7/25 still w/ bloody trach secretions. Tolerating CPAP 7/26 hgb down 6.9 1 u PRBC transfused; complete white out of left hemithorax (again). Underwent bronch w/ cryoprobe for removal of clots. large clot in the left lower lobe as well as friable mucosa throughout the left tracheobronchial tree. Low grade temp. Concern for possible VAP/HCAP so sputum sent and Cefepime started. 7/27: Trach placement 7/29 tolerating PSV  7/30 ATC 8/5-8/6 tolerating atc during day   Interim History / Subjective:  No distress  Objective   Blood pressure (Abnormal) 111/55, pulse 71, temperature 98.5 F (36.9 C), temperature source Oral, resp. rate 17, height 5\' 8"  (1.727 m), weight 92.2 kg, SpO2 100 %.    Vent Mode: PRVC FiO2 (%):  [30 %-40 %] 40 % Set Rate:  [22 bmp] 22 bmp Vt Set:  [510 mL] 510 mL PEEP:  [5 cmH20] 5 cmH20 Plateau Pressure:  [17 cmH20-18 cmH20] 18 cmH20   Intake/Output Summary (Last 24 hours) at 02/08/2021 0906 Last data filed at 02/08/2021 0600 Gross per 24 hour  Intake 1195 ml  Output 1200 ml  Net -5 ml   04/10/2021  02/05/21 0500 02/07/21 0500 02/08/21 0400  Weight: 91.5 kg 93.3 kg 92.2 kg    Examination: General lying in bed. No distress HENT # 8 cuffed trach intact. Otherwise  NCAT Pulm some scattered rhonchi. No accessory use on ATC Card Regular irreg Abd not tender Ext dependent edema Neuro moves all ext. No focal def.   Resolved Hospital Problem list   Septic shock from pneumonia Left upper extremity redness and swelling resolved Relative adrenal insufficiency -Weaned off steroids 7/15 Acute metabolic encephalopathy 2nd to sepsis and hypercapnia - improved Hemoptysis w/ resultant atelectasis/lung collapse. Required trach removal and oral intubation 7/23 to facilitate bronchoscopy and cryoprobe. Ileus (7/22) Hemoptysis with resultant lung collapse s/p cryoprobe again  Leukocytosis Enterobacter Pneumonia  Assessment & Plan:  Acute on chronic hypoxemic respiratory failure  Tracheostomy dependence  Ventilator dependence  Kyphoscoliosis OHS/OSA Tracheobronchitis leading to hemoptysis (resolved) Acute on chronic combined CHF Permanent atrial fibrillation Hyperlipidemia History of CVA Dementia Seizure disorder Depression Severe physical deconditioning DM2 CKD 3a Intermittent fluid and Electrolyte imbalance  Anemia of chronic disease Hypothyroidism Dysphagia now S/P PEG Loose stool  Pulm problem list:   Chronic Respiratory Failure w/ new Tracheostomy and Ventilator dependence due to OSA and OHS complicated further by physical deconditioning -she is not a candidate for decannulation -will require nocturnal ventilation indefinitely -family committed to care  for her at home -last SLP 8/1 tolerated PMV  Plan Cont day time ATC as tolerated w/ mandatory vent at HS and PRN  Continue to encourage daughter to participate on care. Skills she specifically needs to feel comfortable with: suctioning, trach care, inflating and deflating trach cuff. Also PMV use. Once closer to dc need to be sure she is comfortable with the home vent. She will need an over-night here in the hospital for that.  Mobilize as much as able Have placed SLP request to eval  swallowing.    Best Practice (right click and "Reselect all SmartList Selections" daily)   Diet/type: tubefeeds DVT prophylaxis: prophylactic heparin  GI prophylaxis: PPI Lines: Central line  Foley:  N/A Code Status:   full code Last date of multidisciplinary goals of care discussion [7/28 family confirms they want the full scope of practice]  Simonne Martinet ACNP-BC [facility] Pulmonary/Critical Care Pager # [phone] OR # [phone] if no answer    Attending attestation:  Chronic respiratory failure due to OSA, OHS, deconditioning from critical illness. No vent and trach dependent, weaning from vent slowly. Tolerating trach collar during the day. Afebrile overnight. Today she is not feeling as well but cannot say why. Daughter at bedside.  BP (!) 111/55 (BP Location: Right Arm)   Pulse 71   Temp 98.5 F (36.9 C) (Oral)   Resp 17   Ht 5\' 8"  (1.727 m)   Wt 92.2 kg   SpO2 100%   BMI 30.91 kg/m   Chronically ill appearing woman lying in recliner. Annex/AT, eyes anicteric Trach in place, no drainage around it Breathing comfortably on TC, moderate strength cough, some thick secretions. Mild rhonchi. S1S2, RRR Abd soft, NT No LE edema, no cyanosis Awake, trying to talk to communicate. Moving all extremities but globally weak. Appears depressed.  No recent culture data.    Assessment & plan:  Acute on chronic respiratory failure due to OSA, OHS, neuromuscular weakness. Acutely decompensated due to acute pulm edema from heart failure exacerbation. -Con't vent weaning efforts. -Con't LTVV 4-8cc/kg IBW with goal Pplat<30 and DP<15 when requiring full support.  -VAP prevention protocol -Routine  trach care; not a candidate for decannulation due to baseline OSA & OHS. Planning for long-term nocturnal ventilation. Family planning on managing her vent at home. Barriers to discharge are family training, which will have to be full management at least 1 overnight here.  -ongoing SLP PMV  training and swallow evaluation. Con't full TF for now, but hopefully will be able to meet her kcal needs orally eventually.  Steffanie Dunn, DO 02/08/21 10:25 AM [place]

## 2021-02-08 NOTE — Progress Notes (Signed)
  Speech Language Pathology Treatment: [name] Speaking valve  Patient Details Name: [name] MRN: [id number] DOB: 1950-07-05 Today's Date: 02/08/2021 Time: 6659-9357 SLP Time Calculation (min) (ACUTE ONLY): 18 min  Assessment / Plan / Recommendation Clinical Impression  Pt was seen for PMSV co-treatment with OT and with her daughter present. [name] was educated regarding PMSV care, the purpose of the cuff and on deflating and inflating the cuff. [name] was able to demonstrate cuff deflation and apply/remove syringe, and clean the PMSV with min cues. Pt  tolerated cuff deflation well and expectorated blood-tinged secretions following cuff deflation. She tolerated PMSV placement for 30 minutes including the time after the PMSV treatment when she continued to work with OT, and during the bedside swallow evaluation. Vitals range was RR 16-24, SpO2 99-100, and HR 84-86 during this period and pt denied any respiratory difficulty. Vocal quality was functional and vocal intensity was mildly reduced. Pt reported fatigue and respiratory support was impacted by physical tasks completed by OT, but speech intelligibility was only mildly reduced. PMSV may be used with all therapies once full supervision is provided by staff/family. SLP will continue to follow pt.    HPI HPI: Pt is a 70 y/o female presented to [facility] on 7/03 with dyspnea and altered mental status.  Transferred to [facility]; failed BiPAP and required intubation. 7/7 trach; 7/11 PEG. Dx acute on chronic hypoxic/hypercapnic respiratory failure from acute pulmonary edema, pneumonia; severe deconditioning; metabolic encephalopathy.  Had admission from 12/21/20 to 12/30/20 for respiratory failure and CHF exacerbation; was on dysphagia 3 and thin liquids on 6/27. PMHx: Kyphoscoliosis, Restrictive lung disease, Chronic respiratory failure on 2 liters, DM type 2, Chronic a fib on eliquis, Dementia, Chronic combined CHF, Hypothyroidism, Seizures, CKD  3a, CAD s/p NSTEMI, HLD, GERD.      SLP Plan   (BSE today)       Recommendations         Patient may use Passy-Muir Speech Valve: During all therapies with supervision PMSV Supervision: Full MD: Please consider changing trach tube to : Smaller size         Oral Care Recommendations: Oral care BID Follow up Recommendations: Home health SLP SLP Visit Diagnosis: Aphonia (R49.1) Plan:  (BSE today)       [name] I. Tara Clock, MS, CCC-SLP Acute Rehabilitation Services Office number [phone] Pager [phone]                [name] 02/08/2021, 11:47 AM

## 2021-02-08 NOTE — Progress Notes (Signed)
Occupational Therapy Treatment Patient Details Name: [name] MRN: [id number] DOB: 15-Jul-1950 Today's Date: 02/08/2021    History of present illness 70 y.o. female admitted 01/03/21 with acute on chronic respiratory failure with hypoxia. Intubated 7/3-present. 7/7: trachostomy placement. 7/27 occlusion of left lower lobe, trach replaced. PMHx:obesity, T2DM, chronic atrial fibrillation on Eliquis, dementia, diastolic CHF with chronic hypoxemia on 2 L nasal cannula, hypothyroidism, seizure disorder, CKD stage III, CAD with prior NSTEMI, dyslipidemia, and GERD.   OT comments  Patient met seated in recliner with hips slouched anteriorly. Patient able to lift trunk off chair back but unable to assist this therapist with posterior scoot in recliner. Sit to stand from low recliner with Mod A +2 and standard Stedy facing opposite direction. No bariatric Stedy available at time of treatment session. At time of second attempt for sit to stand, patient acquired a skin tear to dorsal aspect of R arm. RN made aware and skin tear dressed appropriately. Patient with request for return to supine to [facility] utilized. OT will continue to follow acutely.    Follow Up Recommendations  LTACH;Home health OT;Supervision/Assistance - 24 hour    Equipment Recommendations  Hospital bed;Other (comment);Wheelchair (measurements OT);Wheelchair cushion (measurements OT) Optician, dispensing w/ pad)    Recommendations for Other Services      Precautions / Restrictions Precautions Precautions: Fall Precaution Comments: 10L with FiO2 40% on trach collar; ok to place passy muir valve on for therapies Restrictions Weight Bearing Restrictions: No       Mobility Bed Mobility Overal bed mobility: Needs Assistance Bed Mobility: Rolling;Sidelying to Sit Rolling: +2 for physical assistance;Min assist     Sit to supine: Total assist;+2 for physical assistance;+2 for safety/equipment   General bed mobility comments: Min A +2 for  rolling R<>L with cues for hand placement. Returned to supine via [employer] Overall transfer level: Needs assistance   Transfers: Sit to/from Stand Sit to Stand: +2 physical assistance;Max assist         General transfer comment: On first attempt for sit to stant from low recliner with BUE on standard Stedy to pull up (feet on floor), patient only able to lift buttocks a few inches off seat. On second attempt patient able to stand fully upright with heavy Mod A +2. Patient aquired skin tear to dorsal aspect of L arm at time of sit to stand transfer. RN notified and bandage placed at site.    Balance Overall balance assessment: Needs assistance Sitting-balance support: Bilateral upper extremity supported Sitting balance-Leahy Scale: Poor Sitting balance - Comments: Able to maintain unsupported sitting balance in recliner for short periods of time with Min A. Max A with increased fatigue.   Standing balance support: Bilateral upper extremity supported;During functional activity Standing balance-Leahy Scale: Poor Standing balance comment: Reliant on +2 external assist.                           ADL either performed or assessed with clinical judgement   ADL Overall ADL's : Needs assistance/impaired                                       General ADL Comments: Grossly dependent     Vision       Perception     Praxis      Cognition Arousal/Alertness: Awake/alert Behavior During Therapy: [facility]  for tasks assessed/performed Overall Cognitive Status: Impaired/Different from baseline Area of Impairment: Orientation;Following commands;Awareness                 Orientation Level: Disoriented to;Time;Situation     Following Commands: Follows one step commands inconsistently;Follows one step commands with increased time   Awareness: Intellectual   General Comments: Patient following 1-step verbal commands more accurately this date;  continues to require repeat cues, hand over hand assist and increase time.        Exercises     Shoulder Instructions       General Comments Daughter present at bedside.    Pertinent Vitals/ Pain       Pain Assessment: Faces Faces Pain Scale: Hurts little more Pain Location: Generalized with movement Pain Descriptors / Indicators: Discomfort;Sore Pain Intervention(s): Limited activity within patient's tolerance;Monitored during session;Repositioned  Home Living                                          Prior Functioning/Environment              Frequency  Min 2X/week        Progress Toward Goals  OT Goals(current goals can now be found in the care plan section)  Progress towards OT goals: Progressing toward goals  Acute Rehab OT Goals Patient Stated Goal: To get stronger OT Goal Formulation: With patient/family Time For Goal Achievement: 02/17/21 Potential to Achieve Goals: Fair ADL Goals Pt Will Perform Eating: with set-up;sitting Pt Will Perform Grooming: with set-up;sitting Pt Will Perform Upper Body Bathing: with min assist;bed level;sitting Pt Will Perform Upper Body Dressing: with min assist;with mod assist;sitting;bed level Additional ADL Goal #1: Patient will tolerate sitting at EOB for 8-10 minutes with close supervision A in prep for seated grooming tasks. Additional ADL Goal #2: Patient will tolerate 10-15 minutes of therapeutic activity with good alertness/arousal indicating increased activity tolerance. Additional ADL Goal #3: Patient will compelte sit to stand transfers with Mod A and use of RW in prep for ADL transfers.  Plan Discharge plan remains appropriate;Frequency remains appropriate    Co-evaluation      Reason for Co-Treatment: To address functional/ADL transfers   OT goals addressed during session: ADL's and self-care;Strengthening/ROM SLP goals addressed during session: Communication    AM-PAC OT "6 Clicks"  Daily Activity     Outcome Measure   Help from another person eating meals?: Total (PEG) Help from another person taking care of personal grooming?: A Lot Help from another person toileting, which includes using toliet, bedpan, or urinal?: Total Help from another person bathing (including washing, rinsing, drying)?: Total Help from another person to put on and taking off regular upper body clothing?: A Lot Help from another person to put on and taking off regular lower body clothing?: Total 6 Click Score: 8    End of Session Equipment Utilized During Treatment: Gait belt;Oxygen (10L via trach collar, FiO2 40%.)  OT Visit Diagnosis: Muscle weakness (generalized) (M62.81)   Activity Tolerance Patient tolerated treatment well;Patient limited by fatigue   Patient Left in bed;with call bell/phone within reach;with bed alarm set   Nurse Communication Mobility status;Other (comment) (Skin tear to R arm with transfer)        Time: 1027-2536 OT Time Calculation (min): 40 min  Charges: OT General Charges $OT Visit: 1 Visit OT Treatments $Self Care/Home Management : 8-22 mins $Therapeutic  Activity: 23-37 mins  Emiyah Spraggins H. OTR/L Supplemental OT, Department of rehab services [phone]   Reinette Cuneo R H. 02/08/2021, 12:21 PM

## 2021-02-08 NOTE — Progress Notes (Signed)
RT placed patient on 40% 10L ATC. Patient tolerating well at this time. RN aware. RT will continue to monitor. 

## 2021-02-08 NOTE — Progress Notes (Signed)
Pt transported to and from CT without event.  

## 2021-02-08 NOTE — Evaluation (Signed)
Clinical/Bedside Swallow Evaluation Patient Details  Name: [name] MRN: [id number] Date of Birth: July 14, 1950  Today's Date: 02/08/2021 Time: SLP Start Time (ACUTE ONLY): 1115 SLP Stop Time (ACUTE ONLY): 1133 SLP Time Calculation (min) (ACUTE ONLY): 18 min  Past Medical History:  Past Medical History:  Diagnosis Date   Abnormal pulmonary function test    Anemia    H/H of 10/30 with a normal MCV in 12/09   Anxiety    Arthritis    Barrett's esophagus    Diagnosed 1995. Last EGD 2016-NO BARRETT'S.    Chest pain    Negative cardiac catheterization in 2002; negative stress nuclear study in 2008   Chronic anticoagulation    Chronic combined systolic and diastolic CHF (congestive heart failure) (HCC)    a. EF predominantly normal during prior echoes but was 40% during 10/2014 echo. b. Most recent 01/2015 EF normal, 55-60%.   Chronic LBP    Surgical intervention in 1996   Diabetes mellitus, type 2 (HCC)    Insulin therapy; exacerbated by prednisone   Dysrhythmia    AFib   Gastroparesis    99% retention 05/2008 on GES   GERD (gastroesophageal reflux disease)    Heart attack (HCC) 08/13/2020   Hiatal hernia    Hyperlipidemia    Hypertension    Hypothyroid    IBS (irritable bowel syndrome)    Obesity    OSA on CPAP    had CPAP and cannot tolerate.   Paroxysmal atrial fibrillation (HCC)    Pulmonary hypertension (HCC) 01/2015   a. Predominantly pulmonary venous hypertension but may be component of PAH.   Seizures (HCC)    last seizure was 2 years ago; on keppra for this; unknown etiology   Syncope    a. Admitted 05/2009; magnetic resonance imagin/ MRA - negative; etiology thought to be orthostasis secondary to drugs and dehydration. b. Syncope 02/2015 also felt 2/2 dehydration.   Past Surgical History:  Past Surgical History:  Procedure Laterality Date   BACK SURGERY  1996   BIOPSY N/A 11/08/2013   Procedure: BIOPSY  / Tissue sampling / ulcers present in small intestine;   Surgeon: West Bali, MD;  [facility];  Service: Endoscopy;  Laterality: N/A;   CARDIAC CATHETERIZATION  2002   CARDIAC CATHETERIZATION N/A 01/26/2015   Procedure: Right Heart Cath;  Surgeon: Laurey Morale, MD;  Location: [facility] INVASIVE CV LAB;  Service: Cardiovascular;  Laterality: N/A;   CARDIOVASCULAR STRESS TEST  2008   Stress nuclear study   CARDIOVERSION N/A 03/06/2015   Procedure: CARDIOVERSION;  Surgeon: Laurey Morale, MD;  Location: [facility] ENDOSCOPY;  Service: Cardiovascular;  Laterality: N/A;   CARPAL TUNNEL RELEASE  1994   COLONOSCOPY  11/2011   Dr. Darrick Penna: Internal hemorrhoids, mild diverticulosis. Random colon biopsies negative.   COLONOSCOPY N/A 11/08/2013   SLF: Normal mucosa in the terminal ileum/The colon IS redundant/  Moderate diverticulosis throughout the entire colon. ileum bx benign. colon bx benign   CRYOTHERAPY  01/23/2021   Procedure: CRYOTHERAPY;  Surgeon: Lorin Glass, MD;  Location: [facility] ENDOSCOPY;  Service: Pulmonary;;   CRYOTHERAPY  01/24/2021   Procedure: Cloyde Reams;  Surgeon: Lorin Glass, MD;  Location: [facility] ENDOSCOPY;  Service: Pulmonary;;   CRYOTHERAPY  01/26/2021   Procedure: Cloyde Reams;  Surgeon: Lupita Leash, MD;  Location: [facility] ENDOSCOPY;  Service: Cardiopulmonary;;   ESOPHAGOGASTRODUODENOSCOPY  2008   Barrett's without dysplasia. esphagus dilated. antral erosions, h.pylori serologies negative.   ESOPHAGOGASTRODUODENOSCOPY  11/2011  Dr. Darrick Penna: Barrett's esophagus, mild gastritis, diverticulum in the second portion of the duodenum repeat EGD 3 years. Small bowel biopsies negative. Gastric biopsy show reactive gastropathy but no H. pylori. Esophageal biopsies consistent with GERD. Next EGD 11/2014   ESOPHAGOGASTRODUODENOSCOPY N/A 11/21/2014   EZM:OQHU non-erosive gastritis/irregular z-line   FOREIGN BODY REMOVAL  01/23/2021   Procedure: FOREIGN BODY REMOVAL;  Surgeon: Lorin Glass, MD;  Location: [facility] ENDOSCOPY;  Service: Pulmonary;;    FOREIGN BODY REMOVAL  01/24/2021   Procedure: FOREIGN BODY REMOVAL;  Surgeon: Lorin Glass, MD;  Location: [facility] ENDOSCOPY;  Service: Pulmonary;;   GIVENS CAPSULE STUDY  12/07/2011   Proximal small bowel, rare AVM. Distal small bowel, multiple ulcers noted   GIVENS CAPSULE STUDY N/A 09/27/2013   Distal small bowel ulcers extending to TI.   GIVENS CAPSULE STUDY N/A 10/10/2013   Procedure: GIVENS CAPSULE STUDY;  Surgeon: West Bali, MD;  Location: [facility];  Service: Endoscopy;  Laterality: N/A;  7:30   HEMOSTASIS CONTROL  01/24/2021   Procedure: HEMOSTASIS CONTROL;  Surgeon: Lorin Glass, MD;  Location: [facility] ENDOSCOPY;  Service: Pulmonary;;   HEMOSTASIS CONTROL  01/26/2021   Procedure: HEMOSTASIS CONTROL;  Surgeon: Lupita Leash, MD;  Location: [facility] ENDOSCOPY;  Service: Cardiopulmonary;;   IR GASTROSTOMY TUBE MOD SED  01/11/2021   IR KYPHO THORACIC WITH BONE BIOPSY  02/09/2018   KNEE ARTHROSCOPY WITH MEDIAL MENISECTOMY Right 06/09/2016   Procedure: KNEE ARTHROSCOPY WITH MEDIAL MENISECTOMY;  Surgeon: Vickki Hearing, MD;  Location: [facility] ORS;  Service: Orthopedics;  Laterality: Right;  medial and lateral menisectomy   LAMINECTOMY  1995   L4-L5   LAPAROSCOPIC CHOLECYSTECTOMY  1990s   LEFT HEART CATHETERIZATION WITH CORONARY ANGIOGRAM  01/10/2014   Procedure: LEFT HEART CATHETERIZATION WITH CORONARY ANGIOGRAM;  Surgeon: Lesleigh Noe, MD;  Location: [facility] CATH LAB;  Service: Cardiovascular;;   RIGHT HEART CATHETERIZATION N/A 01/10/2014   Procedure: RIGHT HEART CATH;  Surgeon: Lesleigh Noe, MD;  Location: [facility] CATH LAB;  Service: Cardiovascular;  Laterality: N/A;   TOTAL ABDOMINAL HYSTERECTOMY  1999   TRACHEOSTOMY TUBE PLACEMENT N/A 09/01/2017   Procedure: TRACHEOSTOMY;  Surgeon: Drema Halon, MD;  Location: [facility] OR;  Service: ENT;  Laterality: N/A;   VIDEO BRONCHOSCOPY Bilateral 01/23/2021   Procedure: VIDEO BRONCHOSCOPY WITHOUT FLUORO;  Surgeon: Lorin Glass, MD;  Location: [facility]  ENDOSCOPY;  Service: Pulmonary;  Laterality: Bilateral;   VIDEO BRONCHOSCOPY Bilateral 01/24/2021   Procedure: VIDEO BRONCHOSCOPY WITHOUT FLUORO;  Surgeon: Lorin Glass, MD;  Location: [facility] ENDOSCOPY;  Service: Pulmonary;  Laterality: Bilateral;   VIDEO BRONCHOSCOPY N/A 01/26/2021   Procedure: VIDEO BRONCHOSCOPY WITHOUT FLUORO;  Surgeon: Lupita Leash, MD;  Location: [facility] ENDOSCOPY;  Service: Cardiopulmonary;  Laterality: N/A;   HPI:  Pt is a 70 y/o female presented to [facility] on 7/03 with dyspnea and altered mental status.  Transferred to [facility]; failed BiPAP and required intubation. 7/7 trach; 7/11 PEG. Dx acute on chronic hypoxic/hypercapnic respiratory failure from acute pulmonary edema, pneumonia; severe deconditioning; metabolic encephalopathy.  Had admission from 12/21/20 to 12/30/20 for respiratory failure and CHF exacerbation; was on dysphagia 3 and thin liquids on 6/27. PMHx: Kyphoscoliosis, Restrictive lung disease, Chronic respiratory failure on 2 liters, DM type 2, Chronic a fib on eliquis, Dementia, Chronic combined CHF, Hypothyroidism, Seizures, CKD 3a, CAD s/p NSTEMI, HLD, GERD.   Assessment / Plan / Recommendation Clinical Impression  Pt was seen for bedside swallow evaluation with her  daughter present. [name]  denied the pt having a history of dysphagia, but stated that the pt is a "slow eater" and avoids hard solids due to her reduced dentition. Oral mechanism exam was [facility] and dentition was reduced. Pt demonstrated signs of aspiration with thin liquids, but tolerated solids without s/sx of aspiration. Mastication was mildly prolonged, but pt's daughter stated that this is her baseline. Oral clearance was adequate. An instrumental assessment is recommended to further assess physiology. Pt's NPO status will be maintained at this time with allowance of ice chips following oral care with PMSV in place. SLP Visit Diagnosis: Dysphagia, unspecified (R13.10)    Aspiration Risk  Mild  aspiration risk    Diet Recommendation Ice chips PRN after oral care   Liquid Administration via: Spoon Medication Administration: Via alternative means Supervision: Full supervision/cueing for compensatory strategies Compensations: Slow rate , PMSV in place   Other  Recommendations Oral Care Recommendations: Oral care BID   Follow up Recommendations Home health SLP      Frequency and Duration min 2x/week  2 weeks       Prognosis Prognosis for Safe Diet Advancement: Good Barriers to Reach Goals: Time post onset      Swallow Study   General Date of Onset: 12/21/20 HPI: Pt is a 70 y/o female presented to [facility] on 7/03 with dyspnea and altered mental status.  Transferred to [facility]; failed BiPAP and required intubation. 7/7 trach; 7/11 PEG. Dx acute on chronic hypoxic/hypercapnic respiratory failure from acute pulmonary edema, pneumonia; severe deconditioning; metabolic encephalopathy.  Had admission from 12/21/20 to 12/30/20 for respiratory failure and CHF exacerbation; was on dysphagia 3 and thin liquids on 6/27. PMHx: Kyphoscoliosis, Restrictive lung disease, Chronic respiratory failure on 2 liters, DM type 2, Chronic a fib on eliquis, Dementia, Chronic combined CHF, Hypothyroidism, Seizures, CKD 3a, CAD s/p NSTEMI, HLD, GERD. Type of Study: Bedside Swallow Evaluation Previous Swallow Assessment: See HPI Diet Prior to this Study: NPO;PEG tube Temperature Spikes Noted: No Respiratory Status: Trach;Trach Collar Trach Size and Type: #8;Cuff;Deflated;With PMSV in place History of Recent Intubation: No Behavior/Cognition: Cooperative;Pleasant mood;Alert Oral Cavity Assessment: Within Functional Limits Oral Care Completed by SLP: Recent completion by staff Oral Cavity - Dentition: Missing dentition (reduced mandibular dentition; absent maxillary and does not wear dentures) Patient Positioning: Upright in bed;Postural control adequate for testing Baseline Vocal Quality: Low vocal  intensity Volitional Cough: Strong Volitional Swallow: Able to elicit    Oral/Motor/Sensory Function Overall Oral Motor/Sensory Function: Within functional limits   Ice Chips Ice chips: Within functional limits Presentation: Spoon   Thin Liquid Thin Liquid: Impaired Presentation: Straw Pharyngeal  Phase Impairments: Cough - Delayed;Throat Clearing - Immediate    Nectar Thick Nectar Thick Liquid: Not tested   Honey Thick Honey Thick Liquid: Not tested   Puree Puree: Within functional limits Presentation: Spoon   Solid     Solid: Within functional limits (Dysphagia 2`) Presentation: Leota Jacobsen I. Vear Clock, MS, CCC-SLP Acute Rehabilitation Services Office number [phone] Pager [phone]  [name] 02/08/2021,12:07 PM

## 2021-02-08 NOTE — Progress Notes (Signed)
[facility] PROGRESS NOTE  [name] VOZ:[id number] DOB: 10-24-1950 DOA: 01/03/2021 PCP: Lemmie Evens, MD   Status: Remains inpatient appropriate because:Unsafe d/c plan, IV treatments appropriate due to intensity of illness or inability to take PO, and Inpatient level of care appropriate due to severity of illness  Dispo: The patient is from: Home              Anticipated d/c is to: Home once generator for electrical back up for nocturnal vent installed; Possible dc to [facility] SNF-also HH will not accept trach < 30 days post insertion; also needs to prove can tolerate Eliquis w/o bleeding as well              Patient currently is not medically stable to d/c.   Difficult to place patient No    Level of care: ICU 2/2 friable pulmonary mucosa/new trach/requirement for nocturnal ventilator  Code Status: Full Family Communication: Daughter at bedside on 8/2 DVT prophylaxis: Subcu heparin COVID vaccination status: Not documented  HPI: 70 yo female who presented to [facility] on 7/03 with dyspnea, and altered mental status. Had admission from 12/21/20 to 12/30/20 for respiratory failure and CHF exacerbation.Transferred to [facility]. SpO2 65% and elevated PCO2.  Failed Bipap and required intubation. Course since then complicated by prolonged mechanical ventilation requiring tracheostomy 7/7. She has been managed by PCCM for 4 weeks, deemed to require home vent which is delayed by need for generator at home. This is being arranged. She was transferred to hospitalist service on 02/01/2021.   Significant events: 7/3  chronic HCRF from OHS and restrictive lung dz as well as deconditioning-->progression -->decompensated HFpEF and cor pulmonale-->worsening metabolic enceph from HCRF-->PNA --> acute on chronic resp failure. Admitted. Intubated. Ceftriaxone 7/3 x 1. Azith and Cefepime started. 7/4-7/5 Hypotension overnight. A line placed. 569m LR bolus. Started on [place] 7/6 Hgb 6.7  (7.4), CT A/P completed to r/o RP bleed (negative), showed mild peripancreatic stranding. Lipase slightly elevated. 1U PRBCs ordered. 7/7 Tracheostomy placed, heparin gtt held for procedure. 7/8 Increased agitation with Propofol wean, increased HR, uptrending pressor requirements. Cortrak placed. Azith and cefepime stopped. 7/11 PEG tube placed, tolerating PSV 7/12 ATC trials started. Tubefeeds started/resumed 7/13 solucortef decreased to daily. Midodrine stopped. Heparin gtt stopped. Stated back on DOAC. PANDA removed 7/14: sat at side of bed w/ max assist for 15 minutes. Fatigued quickly. PT recommending LTAC setting 7/15: Tolerated trach collar yesterday 7/16: TCT x 2 hours 7/18 hgb 6.8 received 1 unit PRBC. LTACH appeal denied 7/22 Tolerating brief periods of trach collar and PMV, Periods of emesis >> TFs held, Bright red blood noted by RT 7/23 left atelectasis, bronchoscopy showing inspissated clot occluding distal trachea, needed cryo probe to remove clot down to left mainstem 7/20 4 repeat bronchoscopy with cryoprobe to remove residual clot left upper lobe and lower lobe, oozing of underlying mucosa, distal clot remains 7/23 atx due to clot. Trach replaced with ETT for purpose of bronchoscopy. Inspissated clot suctioned. No obvious proximal bleeding source identified. Required TXA nebs and Cryoprobe 7/25 still w/ bloody trach secretions. Tolerating CPAP 7/26 hgb down 6.9 1 u PRBC transfused; complete white out of left hemithorax (again). Underwent bronch w/ cryoprobe for removal of clots. large clot in the left lower lobe as well as friable mucosa throughout the left tracheobronchial tree. Low grade temp. Concern for possible VAP/HCAP so sputum sent and Cefepime started. 7/27: Trach placement 7/29 tolerating PSV  7/30 ATC alt w/ nocturnal vent  8/2 increased WOB and has required more frequent vent support in PRVC mode  Subjective: Sleeping soundly and did not awaken during my  exam.  Objective: Vitals:   02/08/21 0500 02/08/21 0600  BP:    Pulse: (!) 56 (!) 55  Resp:  18  Temp:    SpO2: 98% 98%    Intake/Output Summary (Last 24 hours) at 02/08/2021 0736 Last data filed at 02/08/2021 0600 Gross per 24 hour  Intake 1620 ml  Output 1200 ml  Net 420 ml   Filed Weights   02/05/21 0500 02/07/21 0500 02/08/21 0400  Weight: 91.5 kg 93.3 kg 92.2 kg    Exam:  Constitutional: Appears calm and comfortable.  Sleeping soundly. Respiratory: Midline tracheostomy  #8.0 cuffed;  PRVC mode FiO2 30 %, rate 22, VT 510, PEEP 5 HS and TC 35% FIO2 during the day; anterior lung sounds are clear to auscultation without any increased work of breathing Cardiovascular: AF with controlled ventricular response.  No peripheral edema.  Sounds remain normal. Abdomen: PEG for bolus feed-abdomen soft and nontender.  Nurse reported patient had nausea earlier today and was given Zofran. Neurologic: Sleeping but at baseline CN 2-12 grossly intact. Sensation intact,  Strength 3/5 x all 4 extremities.  Psychiatric: Sleeping therefore did not assess today   Assessment/Plan: Acute problems:  Acute on chronic hypoxemic and hypercarbic respiratory failure: Multifactorial including restrictive lung disease due to kyphoscoliosis, OHS/OSA, tracheobronchitis leading to hemoptysis. - Per PCCM -8/7 tolerated trach collar from 11 AM to 6 PM -?  Amiodarone lung injury-level pending collection-as of 8/8 was reordered nurse updated on need for lab   Acute on chronic combined CHF LV systolic dysfunction EF 45 to 50% Moderate RV systolic dysfunction Severe pulmonary hypertension 60 mmHg Mild to moderate MR and moderate TR:  -Cont BB; not on ARB/ACE inhibitor secondary to suboptimal blood pressure reading - continue Lasix per tube 40 mg daily -Obtain  BMETevery 48 hours -continue strict I's/O  Permanent atrial fibrillation - Continue metoprolol, amiodarone -Remains rate controlled -Full dose  anticoagulation on hold due to anemia, hemoptysis requiring bronch which showed friable mucosa. (Preadmission was on Eliquis) -8/4 no further hemoptysis  Diabetes mellitus 2 on long-term insulin; controlled -A1c 7.6% - continue Semglee 10 units daily -Continue SSI  Severe physical deconditioning: -Not candidate for LTAC given chronicity of debility -Plan is to discharge home-CM working on home health options and discussing in detail with daughter -Continue PT and OT  Dysphagia s/p PEG: - Continue bolus tube feeds, free water.   History of CVA - Continue statin   Dementia: - Delirium precautions   Seizure disorder, depression - Continue sertraline, keppra -Has history of TD thus rationale for Cymbalta prior to admission-resumed 8/3   Stage IIIa CKD:  -Currently better than that diagnosis would suggest. - Avoid nephrotoxins -Follow creatinine closely with IV diuresis -current eGFR >60   Acute hypokalemia: - Resolved   Anemia of chronic disease exacerbated by acute blood loss anemia due to hemoptysis (improving):  -Hgb stable at 8.8-possibly a degree of hemodilution from acute heart failure - Monitor intermittently-no further hemoptysis noted   Hypothyroidism:  -TSH 0.697 - Continue synthroid   Loose stool:  -Likely related to tube feeds with benign exam. - Continue rectal tube for now - Continue imodium with low suspicion for GI pathogen.  -Add bulking agent   Data Reviewed: Basic Metabolic Panel: Recent Labs  Lab 02/02/21 0816 02/04/21 0804 02/06/21 0310 02/08/21 0434  NA 135 139 140 136  K 3.9 3.7 4.3 4.3  CL 100 104 107 105  CO2 _0 GLUCOSE 127* 77 83 154*  BUN 29* 28* 27* 29*  CREATININE 0.77 0.82 0.79 0.81  CALCIUM 8.7* 8.7* 8.8* 8.4*   Liver Function Tests: No results for input(s): AST, ALT, ALKPHOS, BILITOT, PROT, ALBUMIN in the last 168 hours.  No results for input(s): LIPASE, AMYLASE in the last 168 hours. No results for input(s):  AMMONIA in the last 168 hours. CBC: Recent Labs  Lab 02/02/21 0816 02/03/21 0406  WBC 7.7 8.3  HGB 8.8* 8.2*  HCT 28.6* 27.1*  MCV 92.0 93.4  PLT 248 246   Cardiac Enzymes: No results for input(s): CKTOTAL, CKMB, CKMBINDEX, TROPONINI in the last 168 hours. BNP (last 3 results) Recent Labs    11/21/20 1656 12/21/20 0953 01/03/21 1527  BNP 425.0* 453.0* 614.0*    ProBNP (last 3 results) No results for input(s): PROBNP in the last 8760 hours.  CBG: Recent Labs  Lab 02/07/21 1116 02/07/21 1520 02/07/21 1938 02/07/21 2322 02/08/21 0321  GLUCAP 131* 218* 247* 204* 139*    Recent Results (from the past 240 hour(s))  MRSA Next Gen by PCR, Nasal     Status: None   Collection Time: 02/07/21 12:16 PM   Specimen: Nasal Mucosa; Nasal Swab  Result Value Ref Range Status   MRSA by PCR Next Gen NOT DETECTED NOT DETECTED Final    Comment: (NOTE) The GeneXpert MRSA Assay (FDA approved for NASAL specimens only), is one component of a comprehensive MRSA colonization surveillance program. It is not intended to diagnose MRSA infection nor to guide or monitor treatment for MRSA infections. Test performance is not FDA approved in patients less than 68 years old. Performed at [facility], [place] [address] [place], [place] [address]      Scheduled Meds:  amiodarone  200 mg Per Tube Daily   atorvastatin  80 mg Per Tube Daily   chlorhexidine gluconate (MEDLINE KIT)  15 mL Mouth Rinse BID   Chlorhexidine Gluconate Cloth  6 each Topical Daily   DULoxetine  30 mg Oral BID   feeding supplement ([place] STANDARD 1.4)  325 mL Per Tube QID   feeding supplement (PROSource TF)  45 mL Per Tube TID   free water  200 mL Per Tube Q6H   furosemide  40 mg Per Tube Daily   gabapentin  100 mg Per Tube Q8H   heparin injection (subcutaneous)  5,000 Units Subcutaneous Q8H   insulin aspart  0-15 Units Subcutaneous Q4H   insulin glargine-yfgn  10 Units Subcutaneous Daily   levETIRAcetam   1,000 mg Per Tube BID   levothyroxine  175 mcg Per Tube Q0600   mouth rinse  15 mL Mouth Rinse 10 times per day   metoprolol tartrate  12.5 mg Per Tube BID   pantoprazole sodium  40 mg Per Tube Daily   sertraline  25 mg Per Tube Daily     Active Problems:   Acute respiratory failure with hypoxia and hypercapnia (HCC)   Acute on chronic respiratory failure with hypoxia and hypercapnia (HCC)   Hypercapnic respiratory failure (HCC)   Status post tracheostomy (HCC)   Encephalopathy acute   Hemoptysis   Tracheostomy dependent (HCC)   Physical deconditioning   Insulin dependent type 2 diabetes mellitus, controlled ([place])   Ventilator dependence ([place])   Consultants: PCCM Interventional radiology Video-assisted bronchoscopy  Procedures: Echocardiogram Tracheostomy tube PEG tube  Antibiotics: Cefepime 7/3  through 7/8 Azithromycin 7/3 through 7/7 Vancomycin 7/3, 7/4 Cefazolin 7/11 x 1 dose Cefepime 7/27 through 7/31   Time spent: 25 minutes    Erin Hearing ANP  [facility] 7 am - 330 pm/M-F for direct patient care and secure chat Please refer to Amion for contact info 36  days

## 2021-02-09 LAB — CBC WITH DIFFERENTIAL/PLATELET
Abs Immature Granulocytes: 0.08 10*3/uL — ABNORMAL HIGH (ref 0.00–0.07)
Basophils Absolute: 0.1 10*3/uL (ref 0.0–0.1)
Basophils Relative: 1 %
Eosinophils Absolute: 0.1 10*3/uL (ref 0.0–0.5)
Eosinophils Relative: 2 %
HCT: 30.1 % — ABNORMAL LOW (ref 36.0–46.0)
Hemoglobin: 9.2 g/dL — ABNORMAL LOW (ref 12.0–15.0)
Immature Granulocytes: 1 %
Lymphocytes Relative: 24 %
Lymphs Abs: 1.9 10*3/uL (ref 0.7–4.0)
MCH: 29.1 pg (ref 26.0–34.0)
MCHC: 30.6 g/dL (ref 30.0–36.0)
MCV: 95.3 fL (ref 80.0–100.0)
Monocytes Absolute: 0.7 10*3/uL (ref 0.1–1.0)
Monocytes Relative: 8 %
Neutro Abs: 5.2 10*3/uL (ref 1.7–7.7)
Neutrophils Relative %: 64 %
Platelets: 245 10*3/uL (ref 150–400)
RBC: 3.16 MIL/uL — ABNORMAL LOW (ref 3.87–5.11)
RDW: 18.4 % — ABNORMAL HIGH (ref 11.5–15.5)
WBC: 8 10*3/uL (ref 4.0–10.5)
nRBC: 0 % (ref 0.0–0.2)

## 2021-02-09 LAB — BASIC METABOLIC PANEL
Anion gap: 10 (ref 5–15)
BUN: 25 mg/dL — ABNORMAL HIGH (ref 8–23)
CO2: 26 mmol/L (ref 22–32)
Calcium: 9.2 mg/dL (ref 8.9–10.3)
Chloride: 102 mmol/L (ref 98–111)
Creatinine, Ser: 0.92 mg/dL (ref 0.44–1.00)
GFR, Estimated: >60 mL/min (ref >60)
Glucose, Bld: 227 mg/dL — ABNORMAL HIGH (ref 70–99)
Potassium: 3.9 mmol/L (ref 3.5–5.1)
Sodium: 138 mmol/L (ref 135–145)

## 2021-02-09 LAB — GLUCOSE, CAPILLARY
Glucose-Capillary: 141 mg/dL — ABNORMAL HIGH (ref 70–99)
Glucose-Capillary: 134 mg/dL — ABNORMAL HIGH (ref 70–99)
Glucose-Capillary: 190 mg/dL — ABNORMAL HIGH (ref 70–99)
Glucose-Capillary: 180 mg/dL — ABNORMAL HIGH (ref 70–99)
Glucose-Capillary: 212 mg/dL — ABNORMAL HIGH (ref 70–99)
Glucose-Capillary: 213 mg/dL — ABNORMAL HIGH (ref 70–99)

## 2021-02-09 MED ORDER — INSULIN GLARGINE-YFGN 100 UNIT/ML ~~LOC~~ SOLN
12.0000 [IU] | Freq: Every day | SUBCUTANEOUS | Status: DC
  Administered 2021-02-10 – 2021-02-15 (×6): 12 [IU] via SUBCUTANEOUS
  Filled 2021-02-09 (×6): qty 0.12

## 2021-02-09 MED ORDER — SOTALOL HCL 80 MG PO TABS: 80.0000 mg | ORAL_TABLET | Freq: Two times a day (BID) | ORAL | Status: DC

## 2021-02-09 MED ORDER — METOPROLOL TARTRATE 12.5 MG HALF TABLET
12.5000 mg | ORAL_TABLET | Freq: Two times a day (BID) | ORAL | Status: DC
  Administered 2021-02-09: 12.5 mg
  Filled 2021-02-09: qty 1

## 2021-02-09 MED ORDER — APIXABAN 5 MG PO TABS
5.0000 mg | ORAL_TABLET | Freq: Two times a day (BID) | ORAL | Status: DC
Start: 2021-02-09 — End: 2021-02-16
  Administered 2021-02-09 – 2021-02-15 (×14): 5 mg
  Filled 2021-02-09 (×14): qty 1

## 2021-02-09 MED ORDER — POTASSIUM CHLORIDE 20 MEQ PO PACK
20.0000 meq | PACK | Freq: Every day | ORAL | Status: DC
  Administered 2021-02-09 – 2021-02-15 (×7): 20 meq
  Filled 2021-02-09 (×7): qty 1

## 2021-02-09 NOTE — Progress Notes (Signed)
Physical Therapy Treatment Patient Details Name: [name] MRN: [id number] DOB: [birth year] Today's Date: 02/09/2021    History of Present Illness 69 y.o. female admitted 01/03/21 with acute on chronic respiratory failure with hypoxia. Intubated 7/3-present. 7/7: trachostomy placement. 7/27 occlusion of left lower lobe, trach replaced. PMHx:obesity, T2DM, chronic atrial fibrillation on Eliquis, dementia, diastolic CHF with chronic hypoxemia on 2 L nasal cannula, hypothyroidism, seizure disorder, CKD stage III, CAD with prior NSTEMI, dyslipidemia, and GERD.    PT Comments    Pt alert on arrival. Able to wear PMSV during session! She continues to have decr attention span and needs repeated cues (and at times assist to complete tasks). Standing well with stedy and likely ready to progress to standing and transfers with RW.     Follow Up Recommendations  LTACH;Home health PT;Supervision/Assistance - 24 hour     Equipment Recommendations  Wheelchair (measurements PT);Wheelchair cushion (measurements PT);Other (comment) (hoyer lift)    Recommendations for Other Services       Precautions / Restrictions Precautions Precautions: Fall Precaution Comments: 10L with FiO2 40% on trach collar; ok to place passy muir valve on for therapies    Mobility  Bed Mobility Overal bed mobility: Needs Assistance Bed Mobility: Rolling;Sidelying to Sit Rolling: Min assist Sidelying to sit: HOB elevated;Min assist            Transfers Overall transfer level: Needs assistance   Transfers: Sit to/from Stand Sit to Stand: +2 physical assistance;Mod assist;From elevated surface         General transfer comment: pt having difficulty following commands for foot placement on stedy (putting feet very close together); pt initially anxious re: trying stedy due to rt arm skin tear on 8/8; stood x 2 for transfer to recliner  Ambulation/Gait                 Stairs             Wheelchair  Mobility    Modified Rankin (Stroke Patients Only)       Balance Overall balance assessment: Needs assistance Sitting-balance support: Bilateral upper extremity supported Sitting balance-Leahy Scale: Poor Sitting balance - Comments: sitting for periods with minguard assist, but with left lean   Standing balance support: Bilateral upper extremity supported;During functional activity Standing balance-Leahy Scale: Poor Standing balance comment: Reliant on +2 external assist.                            Cognition Arousal/Alertness: Awake/alert Behavior During Therapy: WFL for tasks assessed/performed Overall Cognitive Status: Impaired/Different from baseline Area of Impairment: Following commands;Awareness;Attention                 Orientation Level:  (NT) Current Attention Level: Sustained   Following Commands: Follows one step commands inconsistently;Follows one step commands with increased time   Awareness: Intellectual   General Comments: continues to require repeat cues (pt with decr attention to task); hand over hand assist and increase time.      Exercises General Exercises - Lower Extremity Ankle Circles/Pumps: AROM;Both;15 reps;Supine Long Arc Quad: AROM;Seated;Both;10 reps Hip Flexion/Marching: AAROM;Both;10 reps;Seated    General Comments        Pertinent Vitals/Pain Pain Assessment: No/denies pain Faces Pain Scale: No hurt    Home Living                      Prior Function  PT Goals (current goals can now be found in the care plan section) Acute Rehab PT Goals Patient Stated Goal: To get stronger Time For Goal Achievement: 02/11/21 Potential to Achieve Goals: Fair Progress towards PT goals: Progressing toward goals    Frequency    Min 3X/week      PT Plan Current plan remains appropriate    Co-evaluation              AM-PAC PT "6 Clicks" Mobility   Outcome Measure  Help needed turning from  your back to your side while in a flat bed without using bedrails?: A Little Help needed moving from lying on your back to sitting on the side of a flat bed without using bedrails?: A Little Help needed moving to and from a bed to a chair (including a wheelchair)?: Total Help needed standing up from a chair using your arms (e.g., wheelchair or bedside chair)?: Total Help needed to walk in hospital room?: Total Help needed climbing 3-5 steps with a railing? : Total 6 Click Score: 10    End of Session Equipment Utilized During Treatment: Gait belt;Other (comment) (stedy) Activity Tolerance: Patient limited by fatigue Patient left: with call bell/phone within reach;in chair;with chair alarm set;with family/visitor present;with nursing/sitter in room Nurse Communication: Mobility status;Need for lift equipment PT Visit Diagnosis: Unsteadiness on feet (R26.81);Muscle weakness (generalized) (M62.81);Difficulty in walking, not elsewhere classified (R26.2)     Time: 1610-9604 PT Time Calculation (min) (ACUTE ONLY): 28 min  Charges:  $Therapeutic Exercise: 8-22 mins $Therapeutic Activity: 8-22 mins                      Jerolyn Center, PT Pager [phone]    Zena Amos 02/09/2021, 12:52 PM

## 2021-02-09 NOTE — Progress Notes (Addendum)
[facility] PROGRESS NOTE  [name] VOZ:[id number] DOB: 10-24-1950 DOA: 01/03/2021 PCP: Lemmie Evens, MD   Status: Remains inpatient appropriate because:Unsafe d/c plan, IV treatments appropriate due to intensity of illness or inability to take PO, and Inpatient level of care appropriate due to severity of illness  Dispo: The patient is from: Home              Anticipated d/c is to: Home once generator for electrical back up for nocturnal vent installed; Possible dc to veant capable SNF-also HH will not accept trach < 30 days post insertion; also needs to prove can tolerate Eliquis w/o bleeding as well              Patient currently is not medically stable to d/c.   Difficult to place patient No    Level of care: ICU 2/2 friable pulmonary mucosa/new trach/requirement for nocturnal ventilator  Code Status: Full Family Communication: Daughter at bedside on 8/2 DVT prophylaxis: Subcu heparin COVID vaccination status: Not documented  HPI: 70 yo female who presented to [facility] on 7/03 with dyspnea, and altered mental status. Had admission from 12/21/20 to 12/30/20 for respiratory failure and CHF exacerbation.Transferred to [facility]. SpO2 65% and elevated PCO2.  Failed Bipap and required intubation. Course since then complicated by prolonged mechanical ventilation requiring tracheostomy 7/7. She has been managed by PCCM for 4 weeks, deemed to require home vent which is delayed by need for generator at home. This is being arranged. She was transferred to hospitalist service on 02/01/2021.   Significant events: 7/3  chronic HCRF from OHS and restrictive lung dz as well as deconditioning-->progression -->decompensated HFpEF and cor pulmonale-->worsening metabolic enceph from HCRF-->PNA --> acute on chronic resp failure. Admitted. Intubated. Ceftriaxone 7/3 x 1. Azith and Cefepime started. 7/4-7/5 Hypotension overnight. A line placed. 569m LR bolus. Started on [place] 7/6 Hgb 6.7  (7.4), CT A/P completed to r/o RP bleed (negative), showed mild peripancreatic stranding. Lipase slightly elevated. 1U PRBCs ordered. 7/7 Tracheostomy placed, heparin gtt held for procedure. 7/8 Increased agitation with Propofol wean, increased HR, uptrending pressor requirements. Cortrak placed. Azith and cefepime stopped. 7/11 PEG tube placed, tolerating PSV 7/12 ATC trials started. Tubefeeds started/resumed 7/13 solucortef decreased to daily. Midodrine stopped. Heparin gtt stopped. Stated back on DOAC. PANDA removed 7/14: sat at side of bed w/ max assist for 15 minutes. Fatigued quickly. PT recommending LTAC setting 7/15: Tolerated trach collar yesterday 7/16: TCT x 2 hours 7/18 hgb 6.8 received 1 unit PRBC. LTACH appeal denied 7/22 Tolerating brief periods of trach collar and PMV, Periods of emesis >> TFs held, Bright red blood noted by RT 7/23 left atelectasis, bronchoscopy showing inspissated clot occluding distal trachea, needed cryo probe to remove clot down to left mainstem 7/20 4 repeat bronchoscopy with cryoprobe to remove residual clot left upper lobe and lower lobe, oozing of underlying mucosa, distal clot remains 7/23 atx due to clot. Trach replaced with ETT for purpose of bronchoscopy. Inspissated clot suctioned. No obvious proximal bleeding source identified. Required TXA nebs and Cryoprobe 7/25 still w/ bloody trach secretions. Tolerating CPAP 7/26 hgb down 6.9 1 u PRBC transfused; complete white out of left hemithorax (again). Underwent bronch w/ cryoprobe for removal of clots. large clot in the left lower lobe as well as friable mucosa throughout the left tracheobronchial tree. Low grade temp. Concern for possible VAP/HCAP so sputum sent and Cefepime started. 7/27: Trach placement 7/29 tolerating PSV  7/30 ATC alt w/ nocturnal vent  8/2 increased WOB and has required more frequent vent support in PRVC mode  Subjective: Alert.  Daughter at bedside.  Patient complaining of  nausea and on exam having diffuse abdominal pain.  Objective: Vitals:   02/09/21 0900 02/09/21 0931  BP: 130/71 115/86  Pulse: 88 86  Resp:    Temp:    SpO2:      Intake/Output Summary (Last 24 hours) at 02/09/2021 1047 Last data filed at 02/09/2021 0500 Gross per 24 hour  Intake 850 ml  Output 1000 ml  Net -150 ml   Filed Weights   02/07/21 0500 02/08/21 0400 02/09/21 0458  Weight: 93.3 kg 92.2 kg 92.5 kg    Exam:  Constitutional: Alert, calm, appears uncomfortable Respiratory: Midline tracheostomy  #8.0 cuffed;  PRVC mode FiO2 30 %, rate 22, VT 510, PEEP 5 HS and TC 35% FIO2 during the day; lung sounds are coarse and clear.  No increased work of breathing at rest. Cardiovascular: AF with controlled VF.  No peripheral edema.  S1-S2. Abdomen: PEG for bolus feed-abdomen soft nondistended but diffusely tender without guarding or rebounding.  Tube in place with liquid brown stool noted Neurologic: CN 2-12 grossly intact. Sensation intact,  Strength 3/5 x all 4 extremities.  Psychiatric: Alert and oriented x3.  Pleasant affect.   Assessment/Plan: Acute problems:  Acute on chronic hypoxemic and hypercarbic respiratory failure: Multifactorial including restrictive lung disease due to kyphoscoliosis, OHS/OSA, tracheobronchitis leading to hemoptysis. ILD/Pulmonary fibrosis - Per PCCM -8/8 tolerated trach collar from 8 AM to 3 PM.  Apparently had issues with mucous plugging. -?  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  These changes could also be seen in postinfectious or inflammatory scarring or recurrent chronic aspiration.  -As a precaution plan is to DC amiodarone in favor of alternate agent -Can always perform high-resolution CT of the chest later after any acute pulmonary issues resolved.   Acute on chronic combined CHF LV systolic dysfunction EF 45 to 50% Moderate RV  systolic dysfunction Severe pulmonary hypertension 60 mmHg Mild to moderate MR and moderate TR:  -Cont BB; not on ARB/ACE inhibitor secondary to suboptimal blood pressure reading - continue Lasix per tube 40 mg daily -Obtain  BMETevery 48 hours -continue strict I's/O  Diarrhea with abdominal pain -Previous x-ray late last week without evidence of retained stool -Diffuse abdominal pain concerning for possible colitis -Given ongoing diarrhea (had about 500 cc out in the past 24 hours) plan is to rule out possible atypical infectious etiology so we will check fecal lactoferrin -Currently is afebrile and has not had any leukocytosis but if she develops this will need to check C. difficile toxin and obtain a CT of the abdomen and pelvis  Permanent atrial fibrillation -Continue metoprolol, DC amiodarone -Potassium needs to be at least 4.0 therefore we will check electrolyte panel now and daily.  On 8/7 potassium was 4.3 -Unable to begin Betapace given need EKG on 8/8 with QTC 520 ms.  Repeat EKG today.  Hopeful QTC will decrease now that scheduled Zofran has been discontinued -No further hemoptysis.  Discussed with pulmonary medicine and okay to resume Eliquis and watch for further bleeding from trach -Use Zofran for nausea sparingly and avoid other QTC prolonging medications as we did with amiodarone  Diabetes mellitus 2 on long-term insulin; controlled -A1c 7.6% -Increase Semglee to 12 units daily -Continue SSI  Severe physical deconditioning: -Not candidate for LTAC  given chronicity of debility -Plan is to discharge home-CM working on home health options and discussing in detail with daughter -Continue PT and OT  Dysphagia s/p PEG: - Continue bolus tube feeds, free water.   History of CVA - Continue statin   Dementia: - Delirium precautions   Seizure disorder, depression - Continue sertraline, keppra -Has history of TD thus rationale for Cymbalta prior to admission-resumed  8/3   Stage IIIa CKD:  -Currently better than that diagnosis would suggest. - Avoid nephrotoxins -Follow creatinine closely with IV diuresis -current eGFR >60   Acute hypokalemia: - Resolved   Anemia of chronic disease exacerbated by acute blood loss anemia due to hemoptysis (improving):  -Hgb stable at 8.8-possibly a degree of hemodilution from acute heart failure - Monitor intermittently-no further hemoptysis noted   Hypothyroidism:  -TSH 0.697 - Continue synthroid   Loose stool:  -Likely related to tube feeds with benign exam. - Continue rectal tube for now - Continue imodium with low suspicion for GI pathogen.  -Add bulking agent   Data Reviewed: Basic Metabolic Panel: Recent Labs  Lab 02/04/21 0804 02/06/21 0310 02/08/21 0434  NA 139 140 136  K 3.7 4.3 4.3  CL 104 107 105  CO2 _0 GLUCOSE 77 83 154*  BUN 28* 27* 29*  CREATININE 0.82 0.79 0.81  CALCIUM 8.7* 8.8* 8.4*   Liver Function Tests: No results for input(s): AST, ALT, ALKPHOS, BILITOT, PROT, ALBUMIN in the last 168 hours.  No results for input(s): LIPASE, AMYLASE in the last 168 hours. No results for input(s): AMMONIA in the last 168 hours. CBC: Recent Labs  Lab 02/03/21 0406  WBC 8.3  HGB 8.2*  HCT 27.1*  MCV 93.4  PLT 246   Cardiac Enzymes: No results for input(s): CKTOTAL, CKMB, CKMBINDEX, TROPONINI in the last 168 hours. BNP (last 3 results) Recent Labs    11/21/20 1656 12/21/20 0953 01/03/21 1527  BNP 425.0* 453.0* 614.0*    ProBNP (last 3 results) No results for input(s): PROBNP in the last 8760 hours.  CBG: Recent Labs  Lab 02/08/21 1546 02/08/21 1930 02/08/21 2327 02/09/21 0330 02/09/21 0732  GLUCAP 247* 208* 130* 141* 134*    Recent Results (from the past 240 hour(s))  MRSA Next Gen by PCR, Nasal     Status: None   Collection Time: 02/07/21 12:16 PM   Specimen: Nasal Mucosa; Nasal Swab  Result Value Ref Range Status   MRSA by PCR Next Gen NOT DETECTED NOT  DETECTED Final    Comment: (NOTE) The GeneXpert MRSA Assay (FDA approved for NASAL specimens only), is one component of a comprehensive MRSA colonization surveillance program. It is not intended to diagnose MRSA infection nor to guide or monitor treatment for MRSA infections. Test performance is not FDA approved in patients less than 64 years old. Performed at [facility], [place] [address], [place], [place] [address]      Scheduled Meds:  amiodarone  200 mg Per Tube Daily   apixaban  5 mg Per Tube BID   atorvastatin  80 mg Per Tube Daily   chlorhexidine gluconate (MEDLINE KIT)  15 mL Mouth Rinse BID   Chlorhexidine Gluconate Cloth  6 each Topical Daily   DULoxetine  30 mg Oral BID   feeding supplement (KATE FARMS STANDARD 1.4)  325 mL Per Tube QID   feeding supplement (PROSource TF)  45 mL Per Tube TID   free water  100 mL Per Tube Q6H  furosemide  40 mg Per Tube Daily   gabapentin  100 mg Per Tube Q8H   Gerhardt's butt cream  1 application Topical BID   insulin aspart  0-15 Units Subcutaneous Q4H   insulin glargine-yfgn  10 Units Subcutaneous Daily   levETIRAcetam  1,000 mg Per Tube BID   levothyroxine  175 mcg Per Tube Q0600   mouth rinse  15 mL Mouth Rinse 10 times per day   metoprolol tartrate  12.5 mg Per Tube BID   ondansetron (ZOFRAN) IV  4 mg Intravenous QID   pantoprazole sodium  40 mg Per Tube Daily   sertraline  25 mg Per Tube Daily     Active Problems:   Acute respiratory failure with hypoxia and hypercapnia (HCC)   Acute on chronic respiratory failure with hypoxia and hypercapnia (HCC)   Hypercapnic respiratory failure (HCC)   Status post tracheostomy ([place])   Encephalopathy acute   Hemoptysis   Tracheostomy dependent (HCC)   Physical deconditioning   Insulin dependent type 2 diabetes mellitus, controlled ([place])   Ventilator dependence ([place])   Consultants: PCCM Interventional radiology Video-assisted  bronchoscopy  Procedures: Echocardiogram Tracheostomy tube PEG tube  Antibiotics: Cefepime 7/3 through 7/8 Azithromycin 7/3 through 7/7 Vancomycin 7/3, 7/4 Cefazolin 7/11 x 1 dose Cefepime 7/27 through 7/31   Time spent: 25 minutes    Erin Hearing ANP  [facility] 7 am - 330 pm/M-F for direct patient care and secure chat Please refer to Amion for contact info 37  days

## 2021-02-09 NOTE — Progress Notes (Addendum)
  Speech Language Pathology Treatment: Dysphagia;Passy Muir Speaking valve  Patient Details Name: [name] MRN: [id number] DOB: 10-Aug-1950 Today's Date: 02/09/2021 Time: 7106-2694 SLP Time Calculation (min) (ACUTE ONLY): 23 min  Assessment / Plan / Recommendation Clinical Impression  Pt was seen for PMV tx, with prolonged coughing noted upon initial cuff deflation despite slow release. She expectorated a very small amount of secretions tracheally, and with additional cues for slow, deep breaths, her coughing subsided. PMV was then donned for the remainder of the session with no overt signs of intolerance, although at times she whispers words. Min cues were given to facilitate intelligibility. SLP also provided ice chips with swift appearing swallow function and no overt s/s of aspiration. Her daughter returned at the end of the session and education was provided to both pt and daughter about recommendation to proceed with instrumental testing prior to diet initiation. They are in agreement, and any questions about PMV were also answered at this time.   Discussed plan for instrumental testing with RN, who shares that pt is still only tolerating TC for a few hours at a time, and has had some mucus plugging. Therefore, instead of planning to take her downstairs for MBS, will f/u for FEES at bedside. Will tentatively plan for next date, still allowing 1-2 pieces of ice at a time after oral care in the interim.    HPI HPI: Pt is a 70 y/o female presented to [facility] on 7/03 with dyspnea and altered mental status.  Transferred to [facility]; failed BiPAP and required intubation. 7/7 trach; 7/11 PEG. Dx acute on chronic hypoxic/hypercapnic respiratory failure from acute pulmonary edema, pneumonia; severe deconditioning; metabolic encephalopathy.  Had admission from 12/21/20 to 12/30/20 for respiratory failure and CHF exacerbation; was on dysphagia 3 and thin liquids on 6/27. PMHx: Kyphoscoliosis, Restrictive lung  disease, Chronic respiratory failure on 2 liters, DM type 2, Chronic a fib on eliquis, Dementia, Chronic combined CHF, Hypothyroidism, Seizures, CKD 3a, CAD s/p NSTEMI, HLD, GERD.      SLP Plan  Other (Comment) (FEES)       Recommendations  Diet recommendations: NPO;Other(comment) (1-2 ice chips after oral care) Medication Administration: Via alternative means      Patient may use Passy-Muir Speech Valve: During all therapies with supervision PMSV Supervision: Full MD: Please consider changing trach tube to : Smaller size         Oral Care Recommendations: Oral care QID Follow up Recommendations: Home health SLP SLP Visit Diagnosis: Dysphagia, unspecified (R13.10) Plan: Other (Comment) (FEES)       GO                Mahala Menghini., M.A. CCC-SLP Acute Rehabilitation Services Pager [phone] Office [phone]  02/09/2021, 10:35 AM

## 2021-02-09 NOTE — Progress Notes (Signed)
RT NOTE: RT worked with patients family member on suctioning patients trach and cleaning trach. Patient tolerated well. RT will continue to monitor.

## 2021-02-09 NOTE — TOC Progression Note (Signed)
Transition of Care [facility]) - Progression Note    Patient Details  Name: [name] MRN: [id number] Date of Birth: 1951-02-14  Transition of Care [facility]) CM/SW [place], RN Phone Number: 02/09/2021, 2:31 PM  Clinical Narrative:    Case management spoke with Tara Hearing, NP and the patient is requiring increased ventilatory support at this time and she is recommending patient discharge to SNF facility with ventilatory capability.  I met with the patient and daughter at the bedside and they are in agreement for possible bed offer at [facility].  [facility] is currently reviewing the patient for possible placement.  CM and MSW with DTP Will continue to follow the patient for SNF placement.   Expected Discharge Plan: Skilled Nursing Facility Barriers to Discharge: Continued Medical Work up  Expected Discharge Plan and Services Expected Discharge Plan: [place] In-house Referral: Clinical Social Work Discharge Planning Services: CM Consult Post Acute Care Choice: Durable Medical Equipment, Resumption of Svcs/PTA Provider Living arrangements for the past 2 months: Single Family Home                   DME Agency: [employer] Date DME Agency Contacted: 02/02/21 Time DME Agency Contacted: 1129 Representative spoke with at DME Agency: Called and left a message with Bethanne Ginger, CM with [employer] to discuss pending equipment needs for home.     Date HH Agency Contacted: 02/02/21 Time [place]: [phone] Representative spoke with at [place]: Tommi Rumps, [facility] with [place], left message with [place] - [phone]   Social Determinants of Health (SDOH) Interventions    Readmission Risk Interventions Readmission Risk Prevention Plan 12/28/2020 11/24/2020 09/21/2020  Transportation Screening Complete Complete Complete  HRI or [place] - Complete -  Social Work Consult for [place] Planning/Counseling - Complete -  Palliative Care  Screening - Not Applicable -  Medication Review Press photographer) Complete Complete Complete  PCP or Specialist appointment within 3-5 days of discharge - - Complete  HRI or Home Care Consult Complete - Complete  SW Recovery Care/Counseling Consult Complete - Complete  Palliative Care Screening (No Data) - Complete  [place] Patient Refused - Complete  Some recent data might be hidden

## 2021-02-09 NOTE — Progress Notes (Signed)
CSW spoke with Prem at [facility] to determine bed availability and if the facility can accept patient's insurance - Prem to check and return call to CSW.  Edwin Dada, MSW, LCSW Transitions of Care  Clinical Social Worker II [phone]

## 2021-02-09 NOTE — Progress Notes (Addendum)
Possible cuff leaked noted throughout the night.  Each visit, RT had to add more air to cuff d/t pt talking around it or an audible leak.  RN also aware.

## 2021-02-10 DIAGNOSIS — J9621 Acute and chronic respiratory failure with hypoxia: Secondary | ICD-10-CM | POA: Diagnosis not present

## 2021-02-10 DIAGNOSIS — J9622 Acute and chronic respiratory failure with hypercapnia: Secondary | ICD-10-CM | POA: Diagnosis not present

## 2021-02-10 DIAGNOSIS — Z9911 Dependence on respirator [ventilator] status: Secondary | ICD-10-CM | POA: Diagnosis not present

## 2021-02-10 DIAGNOSIS — Z930 Tracheostomy status: Secondary | ICD-10-CM | POA: Diagnosis not present

## 2021-02-10 LAB — BASIC METABOLIC PANEL
Anion gap: 7 (ref 5–15)
BUN: 27 mg/dL — ABNORMAL HIGH (ref 8–23)
CO2: 26 mmol/L (ref 22–32)
Calcium: 8.9 mg/dL (ref 8.9–10.3)
Chloride: 103 mmol/L (ref 98–111)
Creatinine, Ser: 0.82 mg/dL (ref 0.44–1.00)
GFR, Estimated: >60 mL/min (ref >60)
Glucose, Bld: 167 mg/dL — ABNORMAL HIGH (ref 70–99)
Potassium: 4.4 mmol/L (ref 3.5–5.1)
Sodium: 136 mmol/L (ref 135–145)

## 2021-02-10 LAB — CBC WITH DIFFERENTIAL/PLATELET
Abs Immature Granulocytes: 0.09 10*3/uL — ABNORMAL HIGH (ref 0.00–0.07)
Basophils Absolute: 0.1 10*3/uL (ref 0.0–0.1)
Basophils Relative: 1 %
Eosinophils Absolute: 0.1 10*3/uL (ref 0.0–0.5)
Eosinophils Relative: 1 %
HCT: 31.7 % — ABNORMAL LOW (ref 36.0–46.0)
Hemoglobin: 9.4 g/dL — ABNORMAL LOW (ref 12.0–15.0)
Immature Granulocytes: 1 %
Lymphocytes Relative: 24 %
Lymphs Abs: 2.3 10*3/uL (ref 0.7–4.0)
MCH: 28.5 pg (ref 26.0–34.0)
MCHC: 29.7 g/dL — ABNORMAL LOW (ref 30.0–36.0)
MCV: 96.1 fL (ref 80.0–100.0)
Monocytes Absolute: 1 10*3/uL (ref 0.1–1.0)
Monocytes Relative: 11 %
Neutro Abs: 5.9 10*3/uL (ref 1.7–7.7)
Neutrophils Relative %: 62 %
Platelets: 221 10*3/uL (ref 150–400)
RBC: 3.3 MIL/uL — ABNORMAL LOW (ref 3.87–5.11)
RDW: 18.6 % — ABNORMAL HIGH (ref 11.5–15.5)
WBC: 9.4 10*3/uL (ref 4.0–10.5)
nRBC: 0 % (ref 0.0–0.2)

## 2021-02-10 LAB — GLUCOSE, CAPILLARY
Glucose-Capillary: 107 mg/dL — ABNORMAL HIGH (ref 70–99)
Glucose-Capillary: 206 mg/dL — ABNORMAL HIGH (ref 70–99)
Glucose-Capillary: 220 mg/dL — ABNORMAL HIGH (ref 70–99)
Glucose-Capillary: 224 mg/dL — ABNORMAL HIGH (ref 70–99)
Glucose-Capillary: 227 mg/dL — ABNORMAL HIGH (ref 70–99)
Glucose-Capillary: 84 mg/dL (ref 70–99)

## 2021-02-10 LAB — AMIODARONE LEVEL
Amiodarone Lvl: 358 ng/mL — ABNORMAL LOW (ref 1000–2500)
N-Desethyl-Amiodarone: 426 ng/mL

## 2021-02-10 MED ORDER — NUTRISOURCE FIBER PO PACK
1.0000 | PACK | Freq: Two times a day (BID) | ORAL | Status: DC
  Administered 2021-02-10 – 2021-02-15 (×12): 1
  Filled 2021-02-10 (×12): qty 1

## 2021-02-10 MED ORDER — METOPROLOL TARTRATE 25 MG PO TABS
25.0000 mg | ORAL_TABLET | Freq: Two times a day (BID) | ORAL | Status: DC
  Administered 2021-02-10 – 2021-02-15 (×12): 25 mg
  Filled 2021-02-10 (×12): qty 1

## 2021-02-10 MED ORDER — DONEPEZIL HCL 5 MG PO TABS
5.0000 mg | ORAL_TABLET | Freq: Every day | ORAL | Status: DC
  Administered 2021-02-10 – 2021-02-15 (×6): 5 mg
  Filled 2021-02-10 (×6): qty 1

## 2021-02-10 NOTE — Progress Notes (Addendum)
[facility] PROGRESS NOTE  [name] VOZ:[id number] DOB: 10-24-1950 DOA: 01/03/2021 PCP: Lemmie Evens, MD   Status: Remains inpatient appropriate because:Unsafe d/c plan, IV treatments appropriate due to intensity of illness or inability to take PO, and Inpatient level of care appropriate due to severity of illness  Dispo: The patient is from: Home              Anticipated d/c is to: Home once generator for electrical back up for nocturnal vent installed; Possible dc to [facility]-also HH will not accept trach < 30 days post insertion; also needs to prove can tolerate Eliquis w/o bleeding as well              Patient currently is not medically stable to d/c.   Difficult to place patient No    Level of care: ICU 2/2 friable pulmonary mucosa/new trach/requirement for nocturnal ventilator  Code Status: Full Family Communication: Daughter at bedside on 8/2 DVT prophylaxis: Subcu heparin COVID vaccination status: Not documented  HPI: 70 yo female who presented to [facility] on 7/03 with dyspnea, and altered mental status. Had admission from 12/21/20 to 12/30/20 for respiratory failure and CHF exacerbation.Transferred to [facility]. SpO2 65% and elevated PCO2.  Failed Bipap and required intubation. Course since then complicated by prolonged mechanical ventilation requiring tracheostomy 7/7. She has been managed by PCCM for 4 weeks, deemed to require home vent which is delayed by need for generator at home. This is being arranged. She was transferred to hospitalist service on 02/01/2021.   Significant events: 7/3  chronic HCRF from OHS and restrictive lung dz as well as deconditioning-->progression -->decompensated HFpEF and cor pulmonale-->worsening metabolic enceph from HCRF-->PNA --> acute on chronic resp failure. Admitted. Intubated. Ceftriaxone 7/3 x 1. Azith and Cefepime started. 7/4-7/5 Hypotension overnight. A line placed. 569m LR bolus. Started on [place] 7/6 Hgb 6.7  (7.4), CT A/P completed to r/o RP bleed (negative), showed mild peripancreatic stranding. Lipase slightly elevated. 1U PRBCs ordered. 7/7 Tracheostomy placed, heparin gtt held for procedure. 7/8 Increased agitation with Propofol wean, increased HR, uptrending pressor requirements. Cortrak placed. Azith and cefepime stopped. 7/11 PEG tube placed, tolerating PSV 7/12 ATC trials started. Tubefeeds started/resumed 7/13 solucortef decreased to daily. Midodrine stopped. Heparin gtt stopped. Stated back on DOAC. PANDA removed 7/14: sat at side of bed w/ max assist for 15 minutes. Fatigued quickly. PT recommending LTAC setting 7/15: Tolerated trach collar yesterday 7/16: TCT x 2 hours 7/18 hgb 6.8 received 1 unit PRBC. LTACH appeal denied 7/22 Tolerating brief periods of trach collar and PMV, Periods of emesis >> TFs held, Bright red blood noted by RT 7/23 left atelectasis, bronchoscopy showing inspissated clot occluding distal trachea, needed cryo probe to remove clot down to left mainstem 7/20 4 repeat bronchoscopy with cryoprobe to remove residual clot left upper lobe and lower lobe, oozing of underlying mucosa, distal clot remains 7/23 atx due to clot. Trach replaced with ETT for purpose of bronchoscopy. Inspissated clot suctioned. No obvious proximal bleeding source identified. Required TXA nebs and Cryoprobe 7/25 still w/ bloody trach secretions. Tolerating CPAP 7/26 hgb down 6.9 1 u PRBC transfused; complete white out of left hemithorax (again). Underwent bronch w/ cryoprobe for removal of clots. large clot in the left lower lobe as well as friable mucosa throughout the left tracheobronchial tree. Low grade temp. Concern for possible VAP/HCAP so sputum sent and Cefepime started. 7/27: Trach placement 7/29 tolerating PSV  7/30 ATC alt w/ nocturnal vent  8/2 increased WOB and has required more frequent vent support in PRVC mode  Subjective: Alert and in no distress. Tolerated TC since 7am  yesterday  Objective: Vitals:   02/10/21 0600 02/10/21 0724  BP: 136/77   Pulse: 80   Resp: 15   Temp:  98.1 F (36.7 C)  SpO2: 98%     Intake/Output Summary (Last 24 hours) at 02/10/2021 0726 Last data filed at 02/10/2021 0322 Gross per 24 hour  Intake --  Output 1850 ml  Net -1850 ml   Filed Weights   02/08/21 0400 02/09/21 0458 02/10/21 0343  Weight: 92.2 kg 92.5 kg 90.4 kg    Exam:  Constitutional: NAD, pleasant affect, denies nausea or abd pain Respiratory: Midline tracheostomy  #8.0 cuffed;  PRVC mode FiO2 30 %, rate 22, VT 510, PEEP 5 HS (did not use overnight into 8/10) and TC 35% FIO2 during the day; Lungs coarse but clear, no increased WOB, strong cough Cardiovascular: S1S2, AF w/ VR 80-90, no peripheral edema Abdomen: PEG for bolus feed-abd soft and NTTP, normoactive BS+ Neurologic: CN 2-12 grossly intact. Sensation intact,  Strength 3/5 x all 4 extremities.  Psychiatric: A and O X 3, interactive   Assessment/Plan: Acute problems:  Acute on chronic hypoxemic and hypercarbic respiratory failure: Multifactorial including restrictive lung disease due to kyphoscoliosis, OHS/OSA, tracheobronchitis leading to hemoptysis. ILD/Pulmonary fibrosis - Per PCCM -Significantly improved noting has been able to tolerate trach collar since 7 AM on 8/9 as of the a.m. of 8/10 she remains on trach collar -?  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  These changes could also be seen in postinfectious or inflammatory scarring or recurrent chronic aspiration.  -As a precaution plan is to DC amiodarone in favor of alternate agent -Can always perform high-resolution CT of the chest later after any acute pulmonary issues resolved. -PCCM d/w myself and dtr that pt will have days where when she is napping she will require PRVC support -8/10 discussed with Dr. Carlis Abbott.  She states  patient is not stable enough from a respiratory standpoint to transfer out of the ICU just yet.   Acute on chronic combined CHF LV systolic dysfunction EF 45 to 50% Moderate RV systolic dysfunction Severe pulmonary hypertension 60 mmHg Mild to moderate MR and moderate TR:  -Cont BB; not on ARB/ACE inhibitor secondary to suboptimal blood pressure reading - continue Lasix per tube 40 mg daily -Obtain  BMETevery 48 hours -continue strict I's/O  Diarrhea with abdominal pain -Likely from TFs-recent XR w/o retained stool, no fever or leukocytosis -Add fiber for bulking -Imodium prn -Abd pain resolved  Permanent atrial fibrillation -Continue metoprolol- dose increased to 25 mg on 8/10, DC amiodarone -Potassium needs to be at least 4.0 therefore we will check electrolyte panel now and daily.  Potassium was 4.4 -Unable to begin Betapace given EKG on 8/8 with QTC 520 ms.  Repeat EKG 8/10 Qtc down to 494 but needs to be < 450 before initiation of Betapace.   -Eliquis resumed 8/9 -Use Zofran for nausea sparingly and avoid other QTC prolonging medications as we did with amiodarone  Diabetes mellitus 2 on long-term insulin; controlled -A1c 7.6% -Continue Semglee to 12 units daily -Continue SSI  Severe physical deconditioning: -Not candidate for LTAC given chronicity of debility -Plan is to discharge home-CM working on home health options and discussing in detail with daughter -Continue PT and OT  Dysphagia  s/p PEG: - Continue bolus tube feeds, free water.   History of CVA - Continue statin   Dementia: - 8/10 resume home Aricept   Seizure disorder, depression - Continue sertraline, keppra -Has history of TD thus rationale for Cymbalta prior to admission-resumed 8/3   Stage IIIa CKD:  -Currently better than that diagnosis would suggest. - Avoid nephrotoxins -Follow creatinine closely with IV diuresis -current eGFR >60   Acute hypokalemia: - Resolved   Anemia of chronic  disease exacerbated by acute blood loss anemia due to hemoptysis (improving):  -Hgb stable at 8.8-possibly a degree of hemodilution from acute heart failure - Monitor intermittently-no further hemoptysis noted   Hypothyroidism:  -TSH 0.697 - Continue synthroid     Data Reviewed: Basic Metabolic Panel: Recent Labs  Lab 02/04/21 0804 02/06/21 0310 02/08/21 0434 02/09/21 1246 02/10/21 0156  NA 139 140 136 138 136  K 3.7 4.3 4.3 3.9 4.4  CL 104 107 105 102 103  CO2 $Re'27 27 23 26 26  'zKZ$ GLUCOSE 77 83 154* 227* 167*  BUN 28* 27* 29* 25* 27*  CREATININE 0.82 0.79 0.81 0.92 0.82  CALCIUM 8.7* 8.8* 8.4* 9.2 8.9   Liver Function Tests: No results for input(s): AST, ALT, ALKPHOS, BILITOT, PROT, ALBUMIN in the last 168 hours.  No results for input(s): LIPASE, AMYLASE in the last 168 hours. No results for input(s): AMMONIA in the last 168 hours. CBC: Recent Labs  Lab 02/09/21 1246 02/10/21 0156  WBC 8.0 9.4  NEUTROABS 5.2 5.9  HGB 9.2* 9.4*  HCT 30.1* 31.7*  MCV 95.3 96.1  PLT 245 221   Cardiac Enzymes: No results for input(s): CKTOTAL, CKMB, CKMBINDEX, TROPONINI in the last 168 hours. BNP (last 3 results) Recent Labs    11/21/20 1656 12/21/20 0953 01/03/21 1527  BNP 425.0* 453.0* 614.0*    ProBNP (last 3 results) No results for input(s): PROBNP in the last 8760 hours.  CBG: Recent Labs  Lab 02/09/21 1531 02/09/21 1926 02/09/21 2320 02/10/21 0325 02/10/21 0722  GLUCAP 180* 212* 213* 107* 84    Recent Results (from the past 240 hour(s))  MRSA Next Gen by PCR, Nasal     Status: None   Collection Time: 02/07/21 12:16 PM   Specimen: Nasal Mucosa; Nasal Swab  Result Value Ref Range Status   MRSA by PCR Next Gen NOT DETECTED NOT DETECTED Final    Comment: (NOTE) The GeneXpert MRSA Assay (FDA approved for NASAL specimens only), is one component of a comprehensive MRSA colonization surveillance program. It is not intended to diagnose MRSA infection nor to  guide or monitor treatment for MRSA infections. Test performance is not FDA approved in patients less than 18 years old. Performed at [facility], [place] [address], [place], [place] [address]      Scheduled Meds:  apixaban  5 mg Per Tube BID   atorvastatin  80 mg Per Tube Daily   chlorhexidine gluconate (MEDLINE KIT)  15 mL Mouth Rinse BID   Chlorhexidine Gluconate Cloth  6 each Topical Daily   DULoxetine  30 mg Oral BID   feeding supplement ([place] STANDARD 1.4)  325 mL Per Tube QID   feeding supplement (PROSource TF)  45 mL Per Tube TID   free water  100 mL Per Tube Q6H   furosemide  40 mg Per Tube Daily   gabapentin  100 mg Per Tube Q8H   Gerhardt's butt cream  1 application Topical BID   insulin aspart  0-15 Units Subcutaneous Q4H   insulin glargine-yfgn  12 Units Subcutaneous Daily   levETIRAcetam  1,000 mg Per Tube BID   levothyroxine  175 mcg Per Tube Q0600   mouth rinse  15 mL Mouth Rinse 10 times per day   metoprolol tartrate  12.5 mg Per Tube BID   pantoprazole sodium  40 mg Per Tube Daily   potassium chloride  20 mEq Per Tube Daily   sertraline  25 mg Per Tube Daily     Active Problems:   Acute respiratory failure with hypoxia and hypercapnia (HCC)   Acute on chronic respiratory failure with hypoxia and hypercapnia (HCC)   Hypercapnic respiratory failure (HCC)   Status post tracheostomy ([place])   Encephalopathy acute   Hemoptysis   Tracheostomy dependent ([place])   Physical deconditioning   Insulin dependent type 2 diabetes mellitus, controlled ([place])   Ventilator dependence ([place])   Consultants: PCCM Interventional radiology Video-assisted bronchoscopy  Procedures: Echocardiogram Tracheostomy tube PEG tube  Antibiotics: Cefepime 7/3 through 7/8 Azithromycin 7/3 through 7/7 Vancomycin 7/3, 7/4 Cefazolin 7/11 x 1 dose Cefepime 7/27 through 7/31   Time spent: 25 minutes    Erin Hearing ANP  [facility] 7 am - 330 pm/M-F for  direct patient care and secure chat Please refer to Amion for contact info 38  days

## 2021-02-10 NOTE — Progress Notes (Signed)
Physical Therapy Treatment Patient Details Name: [name] MRN: [id number] DOB: 1950-10-03 Today's Date: 02/10/2021    History of Present Illness 70 y.o. female admitted 01/03/21 with acute on chronic respiratory failure with hypoxia. Intubated 7/3-present. 7/7: trachostomy placement. 7/27 occlusion of left lower lobe, trach replaced. PMHx:obesity, T2DM, chronic atrial fibrillation on Eliquis, dementia, diastolic CHF with chronic hypoxemia on 2 L nasal cannula, hypothyroidism, seizure disorder, CKD stage III, CAD with prior NSTEMI, dyslipidemia, and GERD.    PT Comments    Patient initially resistant to working with PT due to fatigue (states didn't sleep well; was on trach collar overnight) and reports of stomach hurting. Daughter able to persuade pt to get OOB as then she will be upright for her swallowing evaluation with Speech at 12:00. Patient able to stand-pivot with RW instead of use of Stedy and did well!    Follow Up Recommendations  LTACH;Home health PT;Supervision/Assistance - 24 hour     Equipment Recommendations  Wheelchair (measurements PT);Wheelchair cushion (measurements PT);Other (comment) (hoyer lift)    Recommendations for Other Services       Precautions / Restrictions Precautions Precautions: Fall Precaution Comments: FiO2 35% on trach collar; ok to place passy muir valve on for therapies Restrictions Weight Bearing Restrictions: No    Mobility  Bed Mobility Overal bed mobility: Needs Assistance Bed Mobility: Sidelying to Sit   Sidelying to sit: HOB elevated;Min assist       General bed mobility comments: Min A for raising torso to full upright    Transfers Overall transfer level: Needs assistance Equipment used: Rolling walker (2 wheeled) Transfers: Sit to/from [employer] Sit to Stand: +2 physical assistance;Min assist;+2 safety/equipment;From elevated surface Stand pivot transfers: +2 safety/equipment;Min assist;+2 physical  assistance       General transfer comment: stood from ICU bed with RW and took ~12 pivotal steps from bed to chair; pt unable to step backwards and chair brought underneath her  Ambulation/Gait Ambulation/Gait assistance: Min assist Gait Distance (Feet): 2 Feet Assistive device: Rolling walker (2 wheeled) Gait Pattern/deviations: Step-to pattern;Decreased step length - right;Decreased step length - left     General Gait Details: taking very small steps as turning to sit in chair and actually walked forward/away from chair (not following instrutions as well--?internally distracted by stomach  hurting)   Stairs             Wheelchair Mobility    Modified Rankin (Stroke Patients Only)       Balance Overall balance assessment: Needs assistance Sitting-balance support: Bilateral upper extremity supported Sitting balance-Leahy Scale: Poor Sitting balance - Comments: sitting for periods with minguard assist, but with left lean Postural control: Posterior lean Standing balance support: Bilateral upper extremity supported;During functional activity Standing balance-Leahy Scale: Poor Standing balance comment: Reliant on +2 external assist.                            Cognition Arousal/Alertness: Awake/alert Behavior During Therapy: WFL for tasks assessed/performed Overall Cognitive Status: Impaired/Different from baseline Area of Impairment: Following commands;Awareness;Attention                 Orientation Level:  (NT) Current Attention Level: Sustained   Following Commands: Follows one step commands inconsistently;Follows one step commands with increased time   Awareness: Intellectual   General Comments: continues to require repeat cues (pt with decr attention to task); hand over hand assist and increase time.  Exercises      General Comments General comments (skin integrity, edema, etc.): Daughter present and encouraging pt to participate.  Told pt she didn't have to do any exercises if she would work with PT to get OOB      Pertinent Vitals/Pain Pain Assessment: Faces Faces Pain Scale: Hurts little more Pain Location: stomach Pain Descriptors / Indicators: Discomfort;Sore Pain Intervention(s): Limited activity within patient's tolerance;Monitored during session    Home Living                      Prior Function            PT Goals (current goals can now be found in the care plan section) Acute Rehab PT Goals Patient Stated Goal: To get stronger PT Goal Formulation: With patient Time For Goal Achievement: 02/11/21 Potential to Achieve Goals: Fair Progress towards PT goals: Progressing toward goals    Frequency    Min 3X/week      PT Plan Current plan remains appropriate    Co-evaluation              AM-PAC PT "6 Clicks" Mobility   Outcome Measure  Help needed turning from your back to your side while in a flat bed without using bedrails?: A Little Help needed moving from lying on your back to sitting on the side of a flat bed without using bedrails?: A Little Help needed moving to and from a bed to a chair (including a wheelchair)?: Total Help needed standing up from a chair using your arms (e.g., wheelchair or bedside chair)?: Total Help needed to walk in hospital room?: Total Help needed climbing 3-5 steps with a railing? : Total 6 Click Score: 10    End of Session Equipment Utilized During Treatment: Gait belt;Oxygen Activity Tolerance: Patient limited by fatigue;Patient limited by pain (stomach pain--pt distracted by this) Patient left: with call bell/phone within reach;in chair;with family/visitor present;with nursing/sitter in room Nurse Communication: Mobility status PT Visit Diagnosis: Unsteadiness on feet (R26.81);Muscle weakness (generalized) (M62.81);Difficulty in walking, not elsewhere classified (R26.2)     Time: 8366-2947 PT Time Calculation (min) (ACUTE ONLY): 17  min  Charges:                         [name], PT Pager [phone]    [name] 02/10/2021, 11:30 AM

## 2021-02-10 NOTE — Procedures (Signed)
Tracheostomy Change Note  Patient Details:   Name: [name] DOB: 1951-03-04 MRN: [id number]    Airway Documentation:     Evaluation  O2 sats: stable throughout Complications: No apparent complications Patient did tolerate procedure well. Bilateral Breath Sounds: Clear, Diminished    RRTx2 changed patients trach from #8Shiley flex to a new #8Shiley flex. Good color change noted. Patient tolerated well no complications and vital signs stable. [name] ties changed and secure. RT will continue to monitor.   Jaquelyn Bitter 02/10/2021, 4:11 PM

## 2021-02-10 NOTE — Progress Notes (Signed)
NAME:  [name], MRN:  [id number], DOB:  10-Feb-1951, LOS: 38 ADMISSION DATE:  01/03/2021, CONSULTATION DATE:  7/3 REFERRING MD:  EDP/[facility], CHIEF COMPLAINT:  dyspnea   History of Present Illness:  70 yo female presented to [facility] on 7/03 with dyspnea, and altered mental status.    Had admission from 12/21/20 to 12/30/20 for respiratory failure and CHF exacerbation.Transferred to [facility]. SpO2 65% and elevated PCO2.  Failed Bipap and required intubation. Course since then complicated by prolonged mechanical ventilation requiring tracheostomy 7/7  Pertinent  Medical History  Kyphoscoliosis, Restrictive lung disease, Chronic respiratory failure on 2 liters, DM type 2, Chronic a fib on eliquis, Dementia, Chronic combined CHF, Hypothyroidism, Seizures, CKD 3a, CAD s/p NSTEMI, HLD, GERD  Significant Hospital Events: Including procedures, antibiotic start and stop dates in addition to other pertinent events   7/3  chronic HCRF from OHS and restrictive lung dz as well as deconditioning-->progression -->decompensated HFpEF and cor pulmonale-->worsening metabolic enceph from HCRF-->PNA --> acute on chronic resp failure. Admitted. Intubated. Ceftriaxone 7/3 x 1. Azith and Cefepime started. 7/4-7/5 Hypotension overnight. A line placed. LR bolus. Started on NE. RUE PICC placed 7/6 Hgb 6.7 (7.4), CT A/P completed to r/o RP bleed (negative), showed mild peripancreatic stranding. Lipase slightly elevated. 1U PRBCs ordered. 7/7 Tracheostomy placed, heparin gtt held for procedure. 7/8 Increased agitation with Propofol wean, increased HR, uptrending pressor requirements. Cortrak placed. Azith and cefepime stopped. 7/11 PEG tube placed, tolerating PSV 7/12 ATC trials started. Tubefeeds started/resumed 7/13 solucortef decreased to daily. Midodrine stopped. Heparin gtt stopped. Stated back on DOAC. PANDA removed 7/14: sat at side of bed w/ max assist for 15 minutes. Fatigued quickly. PT recommending LTAC  setting 7/15: Tolerated trach collar yesterday 7/16: TCT x 2 hours 7/18 hgb 6.8 received 1 unit PRBC. LTACH appeal denied 7/22 Tolerating brief periods of trach collar and PMV, Periods of emesis >> TFs held, Bright red blood noted by RT 7/23 left atelectasis, bronchoscopy showing inspissated clot occluding distal trachea, needed cryo probe to remove clot down to left mainstem 7/20 4 repeat bronchoscopy with cryoprobe to remove residual clot left upper lobe and lower lobe, oozing of underlying mucosa, distal clot remains 7/23 atx due to clot. Trach replaced with ETT for purpose of bronchoscopy. Inspissated clot suctioned. No obvious proximal bleeding source identified. Required TXA nebs and Cryoprobe 7/25 still w/ bloody trach secretions. Tolerating CPAP 7/26 hgb down 6.9 1 u PRBC transfused; complete white out of left hemithorax (again). Underwent bronch w/ cryoprobe for removal of clots. large clot in the left lower lobe as well as friable mucosa throughout the left tracheobronchial tree. Low grade temp. Concern for possible VAP/HCAP so sputum sent and Cefepime started. 7/27: Trach placement 7/29 tolerating PSV  7/30 ATC 8/5-8/6 tolerating atc during day   Interim History / Subjective:  She denies new symptoms.   Objective   Blood pressure 92/62, pulse 85, temperature 98 F (36.7 C), temperature source Oral, resp. rate 14, height 5\' 8"  (1.727 m), weight 90.4 kg, SpO2 100 %.    FiO2 (%):  [35 %-40 %] 35 %   Intake/Output Summary (Last 24 hours) at 02/10/2021 1158 Last data filed at 02/10/2021 1126 Gross per 24 hour  Intake --  Output 2350 ml  Net -2350 ml    Filed Weights   02/08/21 0400 02/09/21 0458 02/10/21 0343  Weight: 92.2 kg 92.5 kg 90.4 kg    Examination: Chronically ill appearing woman lying in bed in NAD [place]/AT,  eyes anicteric Trach in place, on TC Breathing comfortably on TC, CTAB S1S2, RRR Abd soft, NT No significant LE edema. No cyanosis. Skin warm, dry Awake  and alert, moving all extremities  Resolved Hospital Problem list   Septic shock from pneumonia Left upper extremity redness and swelling resolved Relative adrenal insufficiency -Weaned off steroids 7/15 Acute metabolic encephalopathy 2nd to sepsis and hypercapnia - improved Hemoptysis w/ resultant atelectasis/lung collapse. Required trach removal and oral intubation 7/23 to facilitate bronchoscopy and cryoprobe. Ileus (7/22) Hemoptysis with resultant lung collapse s/p cryoprobe again  Leukocytosis Enterobacter Pneumonia  Assessment & Plan:  Acute on chronic hypoxemic and hypercapnic respiratory failure  Tracheostomy dependence  Ventilator dependence  Kyphoscoliosis OHS/OSA Tracheobronchitis leading to hemoptysis (resolved) Acute on chronic combined CHF Permanent atrial fibrillation Hyperlipidemia History of CVA Dementia Seizure disorder Depression Severe physical deconditioning DM2 CKD 3a Intermittent fluid and Electrolyte imbalance  Anemia of chronic disease Hypothyroidism Dysphagia now S/P PEG Loose stool  Pulm problem list:   Chronic hypoxic and hypercapnic respiratory failure w/ new tracheostomy and ventilator- dependence due to chronic OSA and OHS complicated further by physical deconditioning. She had been unable to tolerated NIPPV as an outpatient.  -She is not a candidate for trach decannulation -Will require nocturnal ventilation indefinitely; should not be on TC overnight.  _ok for TC during the day, but should be back on vent if sleeping during the day -Daughter committed to care for her at home; may be able to do her overnight vent training here this weekend if she can get the vent delivered -Continue to encourage daughter to participate in care. Skills she specifically needs to feel comfortable with: suctioning, trach care, inflating and deflating trach cuff, PMV use. Once closer to dc need to be sure she is comfortable with the home vent.  -Mobilize as much  as able-- con't PT & OT -Appreciate SLP's input -Con't bolus TF  CT chest reviewed> significant L>R LL abnormalities from previous bilateral pneumonias. Hard to say that what we are seeing is due to amiodarone when this is <1 month since pneumonia, which can take up to 2 months to radiographically resolve. She appears to have linear scarring more consistent with post-infectious scarring rather than an ILD. --consider restarting amiodarone  Best Practice (right click and "Reselect all SmartList Selections" daily)   Per primary   Steffanie Dunn, DO 02/10/21 2:42 PM [facility]

## 2021-02-10 NOTE — Progress Notes (Signed)
Nutrition Follow-up  DOCUMENTATION CODES:   Obesity unspecified  INTERVENTION:   Continue bolus TF via PEG: Dillard [place] Standard 1.4, 325 ml 4 times per day. Recommend bolus slowly over 15-20 minutes to promote tolerance. Prosource TF 45 ml TID  Provides 1940 kcal, 113 gm protein daily, 936 ml free water daily.  Continue free water flushes 100 ml every 6 hours for a total of 1336 ml free water daily.  NUTRITION DIAGNOSIS:   Inadequate oral intake related to inability to eat as evidenced by NPO status.  Ongoing   GOAL:   Patient will meet greater than or equal to 90% of their needs  Met with TF  MONITOR:   Weight trends, TF tolerance, Labs, Vent status, Skin, I & O's  REASON FOR ASSESSMENT:   Consult Assessment of nutrition requirement/status, Enteral/tube feeding initiation and management  ASSESSMENT:   Pt with a PMH including T2DM, Afib, dementia, CHF, seizure disorder, CKD stage 3, CAD w/ prior NSTEMI, HTN, hypothyrodism, and dyslipidemia admitted with acute combined hypoxic and hypercapnic respiratory failure 2/2 CHF, HTN, restrictive lung disease 2/2 kyphosis. Note pt recently admitted from 6/20-6/29 for acute on chronic hypoxic hypercapnic respiratory failure 2/2 CHF exacerbations.  Discussed patient in ICU rounds and with RN today. Patient has been on and off trach collar and vent support.   Currently on trach collar. Goal is trach collar during the day and vent support at night per discussion in rounds.   Bolus feedings via PEG initiated 7/12. Patient has been tolerating bolus TF without difficulty: Dillard Essex standard 1.4 325 ml QID with Prosource TF 45 ml TID. Receiving free water flushes 100 ml every 6 hours.  Patient remains intubated on ventilator support MV: 8.4 L/min Temp (24hrs), Avg:98.2 F (36.8 C), Min:98 F (36.7 C), Max:98.7 F (37.1 C)   Labs reviewed.  CBG: 107-84-206  Medications reviewed and include aricept, nutrisource fiber BID,  lasix, novolog, semglee, keppra, protonix, potassium chloride.  Admission weight 106.1 kg Current weight 90.4 kg on 8/10 (Lowest weight since admission)   I/O +59 ml since admission UOP 1650 ml x 24 hours Stool output 200 ml x 24 hours  Diet Order:   Diet Order             Diet NPO time specified  Diet effective midnight                   EDUCATION NEEDS:   Not appropriate for education at this time  Skin:  Skin Assessment: Skin Integrity Issues: Other: skin tear R arm; MASD groin  Last BM:  8/10 type 7  Height:   Ht Readings from Last 1 Encounters:  01/03/21 '5\' 8"'  (1.727 m)    Weight:   Wt Readings from Last 1 Encounters:  02/10/21 90.4 kg    Ideal Body Weight:  63.63 kg  BMI:  Body mass index is 30.3 kg/m.  Estimated Nutritional Needs:   Kcal:  1800-2000  Protein:  110-130 gm  Fluid:  >/= 1.8 L    Lucas Mallow, RD, LDN, CNSC Please refer to Amion for contact information.

## 2021-02-10 NOTE — Procedures (Addendum)
Objective Swallowing Evaluation: Type of Study: FEES-Fiberoptic Endoscopic Evaluation of Swallow   Patient Details  Name: [name] MRN: [id number] Date of Birth: Feb 04, 1951  Today's Date: 02/10/2021 Time: SLP Start Time (ACUTE ONLY): 1200 -SLP Stop Time (ACUTE ONLY): 1240  SLP Time Calculation (min) (ACUTE ONLY): 40 min   Past Medical History:  Past Medical History:  Diagnosis Date   Abnormal pulmonary function test    Anemia    H/H of 10/30 with a normal MCV in 12/09   Anxiety    Arthritis    Barrett's esophagus    Diagnosed 1995. Last EGD 2016-NO BARRETT'S.    Chest pain    Negative cardiac catheterization in 2002; negative stress nuclear study in 2008   Chronic anticoagulation    Chronic combined systolic and diastolic CHF (congestive heart failure) (HCC)    a. EF predominantly normal during prior echoes but was 40% during 10/2014 echo. b. Most recent 01/2015 EF normal, 55-60%.   Chronic LBP    Surgical intervention in 1996   Diabetes mellitus, type 2 (HCC)    Insulin therapy; exacerbated by prednisone   Dysrhythmia    AFib   Gastroparesis    99% retention 05/2008 on GES   GERD (gastroesophageal reflux disease)    Heart attack (HCC) 08/13/2020   Hiatal hernia    Hyperlipidemia    Hypertension    Hypothyroid    IBS (irritable bowel syndrome)    Obesity    OSA on CPAP    had CPAP and cannot tolerate.   Paroxysmal atrial fibrillation (HCC)    Pulmonary hypertension (HCC) 01/2015   a. Predominantly pulmonary venous hypertension but may be component of PAH.   Seizures (HCC)    last seizure was 2 years ago; on keppra for this; unknown etiology   Syncope    a. Admitted 05/2009; magnetic resonance imagin/ MRA - negative; etiology thought to be orthostasis secondary to drugs and dehydration. b. Syncope 02/2015 also felt 2/2 dehydration.   Past Surgical History:  Past Surgical History:  Procedure Laterality Date   BACK SURGERY  1996   BIOPSY N/A 11/08/2013    Procedure: BIOPSY  / Tissue sampling / ulcers present in small intestine;  Surgeon: West Bali, MD;  Location: [facility]O SUITE;  Service: Endoscopy;  Laterality: N/A;   CARDIAC CATHETERIZATION  2002   CARDIAC CATHETERIZATION N/A 01/26/2015   Procedure: Right Heart Cath;  Surgeon: Laurey Morale, MD;  Location: [facility] INVASIVE CV LAB;  Service: Cardiovascular;  Laterality: N/A;   CARDIOVASCULAR STRESS TEST  2008   Stress nuclear study   CARDIOVERSION N/A 03/06/2015   Procedure: CARDIOVERSION;  Surgeon: Laurey Morale, MD;  Location: [facility] ENDOSCOPY;  Service: Cardiovascular;  Laterality: N/A;   CARPAL TUNNEL RELEASE  1994   COLONOSCOPY  11/2011   Dr. Darrick Penna: Internal hemorrhoids, mild diverticulosis. Random colon biopsies negative.   COLONOSCOPY N/A 11/08/2013   SLF: Normal mucosa in the terminal ileum/The colon IS redundant/  Moderate diverticulosis throughout the entire colon. ileum bx benign. colon bx benign   CRYOTHERAPY  01/23/2021   Procedure: CRYOTHERAPY;  Surgeon: Lorin Glass, MD;  Location: [facility] ENDOSCOPY;  Service: Pulmonary;;   CRYOTHERAPY  01/24/2021   Procedure: Cloyde Reams;  Surgeon: Lorin Glass, MD;  Location: [facility] ENDOSCOPY;  Service: Pulmonary;;   CRYOTHERAPY  01/26/2021   Procedure: Cloyde Reams;  Surgeon: Lupita Leash, MD;  Location: [facility] ENDOSCOPY;  Service: Cardiopulmonary;;   ESOPHAGOGASTRODUODENOSCOPY  2008   Barrett's without dysplasia.  esphagus dilated. antral erosions, h.pylori serologies negative.   ESOPHAGOGASTRODUODENOSCOPY  11/2011   Dr. Darrick Penna: Barrett's esophagus, mild gastritis, diverticulum in the second portion of the duodenum repeat EGD 3 years. Small bowel biopsies negative. Gastric biopsy show reactive gastropathy but no H. pylori. Esophageal biopsies consistent with GERD. Next EGD 11/2014   ESOPHAGOGASTRODUODENOSCOPY N/A 11/21/2014   ZOX:WRUE non-erosive gastritis/irregular z-line   FOREIGN BODY REMOVAL  01/23/2021   Procedure: FOREIGN BODY REMOVAL;  Surgeon:  Lorin Glass, MD;  Location: [facility] ENDOSCOPY;  Service: Pulmonary;;   FOREIGN BODY REMOVAL  01/24/2021   Procedure: FOREIGN BODY REMOVAL;  Surgeon: Lorin Glass, MD;  Location: [facility] ENDOSCOPY;  Service: Pulmonary;;   GIVENS CAPSULE STUDY  12/07/2011   Proximal small bowel, rare AVM. Distal small bowel, multiple ulcers noted   GIVENS CAPSULE STUDY N/A 09/27/2013   Distal small bowel ulcers extending to TI.   GIVENS CAPSULE STUDY N/A 10/10/2013   Procedure: GIVENS CAPSULE STUDY;  Surgeon: West Bali, MD;  Location: [facility] SUITE;  Service: Endoscopy;  Laterality: N/A;  7:30   HEMOSTASIS CONTROL  01/24/2021   Procedure: HEMOSTASIS CONTROL;  Surgeon: Lorin Glass, MD;  Location: [facility] ENDOSCOPY;  Service: Pulmonary;;   HEMOSTASIS CONTROL  01/26/2021   Procedure: HEMOSTASIS CONTROL;  Surgeon: Lupita Leash, MD;  Location: [facility] ENDOSCOPY;  Service: Cardiopulmonary;;   IR GASTROSTOMY TUBE MOD SED  01/11/2021   IR KYPHO THORACIC WITH BONE BIOPSY  02/09/2018   KNEE ARTHROSCOPY WITH MEDIAL MENISECTOMY Right 06/09/2016   Procedure: KNEE ARTHROSCOPY WITH MEDIAL MENISECTOMY;  Surgeon: Vickki Hearing, MD;  Location: [facility] ORS;  Service: Orthopedics;  Laterality: Right;  medial and lateral menisectomy   LAMINECTOMY  1995   L4-L5   LAPAROSCOPIC CHOLECYSTECTOMY  1990s   LEFT HEART CATHETERIZATION WITH CORONARY ANGIOGRAM  01/10/2014   Procedure: LEFT HEART CATHETERIZATION WITH CORONARY ANGIOGRAM;  Surgeon: Lesleigh Noe, MD;  Location: [facility] CATH LAB;  Service: Cardiovascular;;   RIGHT HEART CATHETERIZATION N/A 01/10/2014   Procedure: RIGHT HEART CATH;  Surgeon: Lesleigh Noe, MD;  Location: [facility] CATH LAB;  Service: Cardiovascular;  Laterality: N/A;   TOTAL ABDOMINAL HYSTERECTOMY  1999   TRACHEOSTOMY TUBE PLACEMENT N/A 09/01/2017   Procedure: TRACHEOSTOMY;  Surgeon: Drema Halon, MD;  Location: [facility] OR;  Service: ENT;  Laterality: N/A;   VIDEO BRONCHOSCOPY Bilateral 01/23/2021   Procedure: VIDEO  BRONCHOSCOPY WITHOUT FLUORO;  Surgeon: Lorin Glass, MD;  Location: [facility] ENDOSCOPY;  Service: Pulmonary;  Laterality: Bilateral;   VIDEO BRONCHOSCOPY Bilateral 01/24/2021   Procedure: VIDEO BRONCHOSCOPY WITHOUT FLUORO;  Surgeon: Lorin Glass, MD;  Location: [facility] ENDOSCOPY;  Service: Pulmonary;  Laterality: Bilateral;   VIDEO BRONCHOSCOPY N/A 01/26/2021   Procedure: VIDEO BRONCHOSCOPY WITHOUT FLUORO;  Surgeon: Lupita Leash, MD;  Location: [facility] ENDOSCOPY;  Service: Cardiopulmonary;  Laterality: N/A;   HPI: Pt is a 70 y/o female presented to [facility] on 7/03 with dyspnea and altered mental status.  Transferred to [facility]; failed BiPAP and required intubation. 7/7 trach; 7/11 PEG. Dx acute on chronic hypoxic/hypercapnic respiratory failure from acute pulmonary edema, pneumonia; severe deconditioning; metabolic encephalopathy.  Had admission from 12/21/20 to 12/30/20 for respiratory failure and CHF exacerbation; was on dysphagia 3 and thin liquids on 6/27. PMHx: Kyphoscoliosis, Restrictive lung disease, Chronic respiratory failure on 2 liters, DM type 2, Chronic a fib on eliquis, Dementia, Chronic combined CHF, Hypothyroidism, Seizures, CKD 3a, CAD s/p NSTEMI, HLD, GERD.   Subjective: alert, following commands;  daughter at bedside    Assessment / Plan / Recommendation  CHL IP CLINICAL IMPRESSIONS 02/10/2021  Clinical Impression Pt participated in FEES exam with her daughter, [name], at bedside.  PMV in place during study; pt was on ATC. Laryngeal anatomy/physiology was WNL with no edema, no erythema, good integrity of mucosa within larynx, bilateral adduction of vocal folds.  Initial swallows of ice chips led to trace residue and revealed mild secretions within pharynx.  Swallow initiation occurred at the level of the pyriforms - however overall timing and efficiency of the swallow improved as study progressed and with each successive swallow. Pt consumed purees, thin, and nectar thick liquids with NO ASPIRATION.   There was high penetration (PAS 2), considered WFL, on only two occasions (thins and puree).  There was adequate pharyngeal squeeze with no remarkable residue after the swallow for any consistencies. Recommend beginning a dysphagia 1 diet with thin liquids - pt may use straws.  PMV must be in place for all PO consumption. Meds via PEG for now.  Anticipate we will be able to advance diet at bedside based on clinical performance.  Denise watched the study in real time and was educated regarding results and recommendations.  SLP Visit Diagnosis Dysphagia, oropharyngeal phase (R13.12)  Attention and concentration deficit following --  Frontal lobe and executive function deficit following --  Impact on safety and function Mild aspiration risk      CHL IP TREATMENT RECOMMENDATION 02/10/2021  Treatment Recommendations Therapy as outlined in treatment plan below     Prognosis 02/10/2021  Prognosis for Safe Diet Advancement Good  Barriers to Reach Goals --  Barriers/Prognosis Comment --    CHL IP DIET RECOMMENDATION 02/10/2021  SLP Diet Recommendations Dysphagia 1 (Puree) solids;Thin liquid  Liquid Administration via Straw;Cup  Medication Administration Via alternative means  Compensations --  Postural Changes Remain semi-upright after after feeds/meals (Comment)      CHL IP OTHER RECOMMENDATIONS 02/10/2021  Recommended Consults --  Oral Care Recommendations Oral care BID  Other Recommendations --      CHL IP FOLLOW UP RECOMMENDATIONS 02/10/2021  Follow up Recommendations Home health SLP      CHL IP FREQUENCY AND DURATION 02/10/2021  Speech Therapy Frequency (ACUTE ONLY) min 2x/week  Treatment Duration --                No flowsheet data found.  Szymon Foiles L. Samson Frederic, MA CCC/SLP Acute Rehabilitation Services Office number [phone] Pager [phone]  [name] 02/10/2021, 2:46 PM

## 2021-02-10 NOTE — Progress Notes (Signed)
Inpatient Diabetes Program Recommendations  AACE/ADA: New Consensus Statement on Inpatient Glycemic Control (2015)  Target Ranges:  Prepandial:   less than 140 mg/dL      Peak postprandial:   less than 180 mg/dL (1-2 hours)      Critically ill patients:  140 - 180 mg/dL   Lab Results  Component Value Date   GLUCAP 84 02/10/2021   HGBA1C 7.6 (H) 11/22/2020     Results for RONNEISHA, JETT (MRN [id number]) as of 02/10/2021 10:57  Ref. Range 02/09/2021 07:32 02/09/2021 11:26 02/09/2021 15:31 02/09/2021 19:26 02/09/2021 23:20 02/10/2021 03:25 02/10/2021 07:22  Glucose-Capillary Latest Ref Range: 70 - 99 mg/dL 209 (H) 470 (H) 962 (H) 212 (H) 213 (H) 107 (H) 84   Diabetes history: DM2 Outpatient Diabetes medications: Lantus 20 units QD Current orders for Inpatient glycemic control: Semglee 12 units QAM (increased today from 10 units), Novolog 0-15 units TID  Inpatient Diabetes Program Recommendations:    Fasting CBG was 84 mg/dL.  Semglee increased today to 12 units QAM.    Please consider reducing basal and adding very small dose of meal coverage if PP remain elevated.    Will continue to follow while inpatient.  Thank you, Dulce Sellar, RN, BSN Diabetes Coordinator Inpatient Diabetes Program [phone] (team pager from 8a-5p)

## 2021-02-10 NOTE — TOC Progression Note (Addendum)
Transition of Care [facility]) - Progression Note    Patient Details  Name: [name] MRN: [id number] Date of Birth: Feb 25, 1951  Transition of Care [facility]) CM/SW Contact  Curlene Labrum, RN Phone Number: 02/10/2021, 1:03 PM  Clinical Narrative:    Case management met with the patient's daughter at the bedside regarding transitions of care.  The patient is progressing well with her ventilatory effort and the daughter would like to meet with [employer] RT to practice use of the home ventilator.  I called and spoke with Hubbard Robinson, RT with [employer] and Lyndee Leo, RT with [employer] may meet with the daughter on Friday to re-discuss home ventilator usage.  I called and spoke with Angela Nevin, RN 30M director and received permission to have the [employer] representative meet with the family for Friday evening teaching in the 2 M unit at the bedside and she is in agreement and will communicate this with the bedside nurses as well.  I called and left a message with Meg, CM with [facility] to provide home health services if the patient is able to discharge home with the daughter.  [facility] is continuing to review the patient in the meantime for possible bed offer as backup to the patient returning home.  The patient's daughter expressed interest in Remote Health with [facility] - I called and spoke with Laurey Arrow, CM with [facility] and they are unable to provide home health services but have other clinical services available to the patient's daughter.  I will communicate this with the daughter so she is aware.  CM and MSW will continue to follow the patient for TOC needs.  02/10/2021  1420 - I called and spoke with Rhea Pink, RNCM with [facility] and she is aware that the patient's family has been actively involved with the patient's care at the hospital and continues to request to take the patient's home when she is medically stable.  [facility] is reviewing the patient for a possible admission to  bridge to going home but has not offered a bed to the patient at this time.  [place], RNCM at [facility] states that they will be supportive of providing reimbursement to the home health provider if the patient is discharged home.  I called and spoke with Meg, RNCM at [facility] and she will review the patient for possible home health services at this time.  I spoke with Raquel Sarna, CM at [facility] and she will review the patient as well for SNF placement as a backup, considering I am unable to establish home health providers for the patient.  [facility] is reviewing the patient at this time only.   Expected Discharge Plan: Skilled Nursing Facility Barriers to Discharge: Continued Medical Work up  Expected Discharge Plan and Services Expected Discharge Plan: [place] In-house Referral: Clinical Social Work Discharge Planning Services: CM Consult Post Acute Care Choice: Durable Medical Equipment, Resumption of Svcs/PTA Provider Living arrangements for the past 2 months: Single Family Home                   DME Agency: [employer] Date DME Agency Contacted: 02/02/21 Time DME Agency Contacted: 1129 Representative spoke with at DME Agency: Called and left a message with Bethanne Ginger, CM with [employer] to discuss pending equipment needs for home.     Date HH Agency Contacted: 02/02/21 Time [place]: 1165 Representative spoke with at Center: Tommi Rumps, [facility] with [place], left message with [facility] - [phone]   Social Determinants of Health (SDOH) Interventions    Readmission Risk Interventions Readmission Risk Prevention Plan 12/28/2020 11/24/2020 09/21/2020  Transportation Screening Complete Complete Complete  HRI or Home Care Consult - Complete -  Social Work Consult for [place] Planning/Counseling - Complete -  Palliative Care Screening - Not Applicable -  Medication Review Press photographer) Complete Complete Complete  PCP or Specialist  appointment within 3-5 days of discharge - - Complete  HRI or Home Care Consult Complete - Complete  SW Recovery Care/Counseling Consult Complete - Complete  Palliative Care Screening (No Data) - Complete  [place] Patient Refused - Complete  Some recent data might be hidden

## 2021-02-11 LAB — CBC WITH DIFFERENTIAL/PLATELET
Abs Immature Granulocytes: 0.06 10*3/uL (ref 0.00–0.07)
Basophils Absolute: 0 10*3/uL (ref 0.0–0.1)
Basophils Relative: 1 %
Eosinophils Absolute: 0.2 10*3/uL (ref 0.0–0.5)
Eosinophils Relative: 3 %
HCT: 28.7 % — ABNORMAL LOW (ref 36.0–46.0)
Hemoglobin: 8.9 g/dL — ABNORMAL LOW (ref 12.0–15.0)
Immature Granulocytes: 1 %
Lymphocytes Relative: 27 %
Lymphs Abs: 1.9 10*3/uL (ref 0.7–4.0)
MCH: 29.2 pg (ref 26.0–34.0)
MCHC: 31 g/dL (ref 30.0–36.0)
MCV: 94.1 fL (ref 80.0–100.0)
Monocytes Absolute: 0.7 10*3/uL (ref 0.1–1.0)
Monocytes Relative: 11 %
Neutro Abs: 4.1 10*3/uL (ref 1.7–7.7)
Neutrophils Relative %: 57 %
Platelets: 190 10*3/uL (ref 150–400)
RBC: 3.05 MIL/uL — ABNORMAL LOW (ref 3.87–5.11)
RDW: 18.6 % — ABNORMAL HIGH (ref 11.5–15.5)
WBC: 7.1 10*3/uL (ref 4.0–10.5)
nRBC: 0 % (ref 0.0–0.2)

## 2021-02-11 LAB — GLUCOSE, CAPILLARY
Glucose-Capillary: 189 mg/dL — ABNORMAL HIGH (ref 70–99)
Glucose-Capillary: 137 mg/dL — ABNORMAL HIGH (ref 70–99)
Glucose-Capillary: 165 mg/dL — ABNORMAL HIGH (ref 70–99)
Glucose-Capillary: 176 mg/dL — ABNORMAL HIGH (ref 70–99)
Glucose-Capillary: 246 mg/dL — ABNORMAL HIGH (ref 70–99)
Glucose-Capillary: 176 mg/dL — ABNORMAL HIGH (ref 70–99)

## 2021-02-11 LAB — LACTOFERRIN, FECAL, QUALITATIVE: Lactoferrin, Fecal, Qual: POSITIVE — AB

## 2021-02-11 LAB — BASIC METABOLIC PANEL
Anion gap: 7 (ref 5–15)
BUN: 25 mg/dL — ABNORMAL HIGH (ref 8–23)
CO2: 27 mmol/L (ref 22–32)
Calcium: 8.8 mg/dL — ABNORMAL LOW (ref 8.9–10.3)
Chloride: 101 mmol/L (ref 98–111)
Creatinine, Ser: 0.79 mg/dL (ref 0.44–1.00)
GFR, Estimated: >60 mL/min (ref >60)
Glucose, Bld: 202 mg/dL — ABNORMAL HIGH (ref 70–99)
Potassium: 4.5 mmol/L (ref 3.5–5.1)
Sodium: 135 mmol/L (ref 135–145)

## 2021-02-11 MED ORDER — ENSURE ENLIVE PO LIQD
237.0000 mL | Freq: Two times a day (BID) | ORAL | Status: DC
  Administered 2021-02-11 – 2021-02-13 (×4): 237 mL via ORAL

## 2021-02-11 NOTE — TOC Progression Note (Addendum)
Transition of Care [facility]) - Progression Note    Patient Details  Name: [name] MRN: [id number] Date of Birth: August 09, 1950  Transition of Care [facility]) CM/SW [place], RN Phone Number: 02/11/2021, 7:52 AM  Clinical Narrative:    Case management spoke with Burgess Estelle, CM at [facility] and the patient is being reviewed for possible placement at the facility since the patient is unable to discharge to home at this point.  CM spoke with Felicity Coyer, [place] with [facility] [phone] and the home health agency is reviewing the patient for home health services once the patient is able to discharge from a facility or go home with family members.  I spoke with Landry Corporal, RNCM with Complex Case Management with [facility] and she is aware that the patient might need a skilled nursing bridge admission prior to returning home with family.  The patient's daughter and husband plan to provide 24-hour support to the patient at home and have been providing care to the patient while she has been admitted to 2 M ICU at [facility].  The patient's daughter is anxious to have the patient return home, but if home health services are not available upon discharge, then patient might need to admit to [place] and MSW will continue to follow the patient for discharge needs.  02/11/2021 1037 -CM spoke with Raquel Sarna Perkin's CM at [facility] and they are able to offer the patient an admissions bed at [facility] and she is starting insurance authorization with [facility] insurance.  I called and spoke with Carter Kitten, [facility] complex case manager and asked the patient be approved for a bridge admission at [facility] until she is able to discharge to home.  The patient is progressing with PT and able to mobilize to the chair in the room at this time and has done well on trach collar and respiratory support at night.  [place] with [facility] will speak with the Medical Director with [facility] to  see if he will approve the admission.   I spoke with Meg, RNCM with [facility] and they are willing to accept the patient for home health services after the patient is discharged from [facility] or the hospital when medically safe to discharge.  I spoke with Bethanne Ginger, CM with [employer] and the patient will have 24 hour respiratory care support when she is discharged home with the daughter.  CM met at the bedside with the patient and the daughter are they are aware of the discharge plan discussed above.   Expected Discharge Plan: Skilled Nursing Facility Barriers to Discharge: Continued Medical Work up  Expected Discharge Plan and Services Expected Discharge Plan: [place] In-house Referral: Clinical Social Work Discharge Planning Services: CM Consult Post Acute Care Choice: Durable Medical Equipment, Resumption of Svcs/PTA Provider Living arrangements for the past 2 months: Single Family Home                   DME Agency: [employer] Date DME Agency Contacted: 02/02/21 Time DME Agency Contacted: 1129 Representative spoke with at DME Agency: Called and left a message with Bethanne Ginger, CM with [employer] to discuss pending equipment needs for home.     Date HH Agency Contacted: 02/02/21 Time [place]: 8366 Representative spoke with at [place]: Tommi Rumps, [facility] with [place], left message with [facility] - [phone]   Social Determinants of Health (SDOH) Interventions    Readmission Risk  Interventions Readmission Risk Prevention Plan 12/28/2020 11/24/2020 09/21/2020  Transportation Screening Complete Complete Complete  HRI or Home Care Consult - Complete -  Social Work Consult for [place] Planning/Counseling - Complete -  Palliative Care Screening - Not Applicable -  Medication Review Press photographer) Complete Complete Complete  PCP or Specialist appointment within 3-5 days of discharge - - Complete  HRI or Home Care Consult Complete -  Complete  SW Recovery Care/Counseling Consult Complete - Complete  Palliative Care Screening (No Data) - Complete  [place] Patient Refused - Complete  Some recent data might be hidden

## 2021-02-11 NOTE — Progress Notes (Signed)
[facility] PROGRESS NOTE  [name] VOZ:[id number] DOB: 10-24-1950 DOA: 01/03/2021 PCP: Lemmie Evens, MD   Status: Remains inpatient appropriate because:Unsafe d/c plan, IV treatments appropriate due to intensity of illness or inability to take PO, and Inpatient level of care appropriate due to severity of illness  Dispo: The patient is from: Home              Anticipated d/c is to: Home once generator for electrical back up for nocturnal vent installed; Possible dc to [facility] SN[facility]-also HH will not accept trach < 30 days post insertion; also needs to prove can tolerate Eliquis w/o bleeding as well              Patient currently is not medically stable to d/c.   Difficult to place patient No    Level of care: ICU 2/2 friable pulmonary mucosa/new trach/requirement for nocturnal ventilator  Code Status: Full Family Communication: Daughter at bedside on 8/2 DVT prophylaxis: Subcu heparin COVID vaccination status: Not documented  HPI: 70 yo female who presented to [facility] on 7/03 with dyspnea, and altered mental status. Had admission from 12/21/20 to 12/30/20 for respiratory failure and CHF exacerbation.Transferred to [facility]. SpO2 65% and elevated PCO2.  Failed Bipap and required intubation. Course since then complicated by prolonged mechanical ventilation requiring tracheostomy 7/7. She has been managed by PCCM for 4 weeks, deemed to require home vent which is delayed by need for generator at home. This is being arranged. She was transferred to hospitalist service on 02/01/2021.   Significant events: 7/3  chronic HCRF from OHS and restrictive lung dz as well as deconditioning-->progression -->decompensated HFpEF and cor pulmonale-->worsening metabolic enceph from HCRF-->PNA --> acute on chronic resp failure. Admitted. Intubated. Ceftriaxone 7/3 x 1. Azith and Cefepime started. 7/4-7/5 Hypotension overnight. A line placed. 569m LR bolus. Started on [place] 7/6 Hgb 6.7  (7.4), CT A/P completed to r/o RP bleed (negative), showed mild peripancreatic stranding. Lipase slightly elevated. 1U PRBCs ordered. 7/7 Tracheostomy placed, heparin gtt held for procedure. 7/8 Increased agitation with Propofol wean, increased HR, uptrending pressor requirements. Cortrak placed. Azith and cefepime stopped. 7/11 PEG tube placed, tolerating PSV 7/12 ATC trials started. Tubefeeds started/resumed 7/13 solucortef decreased to daily. Midodrine stopped. Heparin gtt stopped. Stated back on DOAC. PANDA removed 7/14: sat at side of bed w/ max assist for 15 minutes. Fatigued quickly. PT recommending LTAC setting 7/15: Tolerated trach collar yesterday 7/16: TCT x 2 hours 7/18 hgb 6.8 received 1 unit PRBC. LTACH appeal denied 7/22 Tolerating brief periods of trach collar and PMV, Periods of emesis >> TFs held, Bright red blood noted by RT 7/23 left atelectasis, bronchoscopy showing inspissated clot occluding distal trachea, needed cryo probe to remove clot down to left mainstem 7/20 4 repeat bronchoscopy with cryoprobe to remove residual clot left upper lobe and lower lobe, oozing of underlying mucosa, distal clot remains 7/23 atx due to clot. Trach replaced with ETT for purpose of bronchoscopy. Inspissated clot suctioned. No obvious proximal bleeding source identified. Required TXA nebs and Cryoprobe 7/25 still w/ bloody trach secretions. Tolerating CPAP 7/26 hgb down 6.9 1 u PRBC transfused; complete white out of left hemithorax (again). Underwent bronch w/ cryoprobe for removal of clots. large clot in the left lower lobe as well as friable mucosa throughout the left tracheobronchial tree. Low grade temp. Concern for possible VAP/HCAP so sputum sent and Cefepime started. 7/27: Trach placement 7/29 tolerating PSV  7/30 ATC alt w/ nocturnal vent  8/2 increased WOB and has required more frequent vent support in PRVC mode  Subjective: No acute issues or events overnight continues to tolerate  trach collar with increasing success  Objective: Vitals:   02/11/21 1300 02/11/21 1400  BP: 107/78 116/61  Pulse: 76 67  Resp: 19 17  Temp:    SpO2: 95% 96%    Intake/Output Summary (Last 24 hours) at 02/11/2021 1417 Last data filed at 02/11/2021 1400 Gross per 24 hour  Intake 657 ml  Output 1350 ml  Net -693 ml    Filed Weights   02/09/21 0458 02/10/21 0343 02/11/21 0406  Weight: 92.5 kg 90.4 kg 88.1 kg    Exam:  Constitutional: NAD, pleasant affect, denies nausea or abd pain Respiratory: Midline tracheostomy  #8.0 cuffed;  PRVC mode FiO2 30 %, rate 22, VT 510, PEEP 5 HS (did not use overnight into 8/10) and TC 35% FIO2 during the day; Lungs coarse but clear, no increased WOB, strong cough Cardiovascular: S1S2, AF w/ VR 80-90, no peripheral edema Abdomen: PEG for bolus feed-abd soft and NTTP, normoactive BS+ Neurologic: CN 2-12 grossly intact. Sensation intact,  Strength 3/5 x all 4 extremities.  Psychiatric: A and O X 3, interactive   Assessment/Plan: Acute problems:  Acute on chronic hypoxemic and hypercarbic respiratory failure: Multifactorial including restrictive lung disease due to kyphoscoliosis, OHS/OSA, tracheobronchitis leading to hemoptysis. ILD/Pulmonary fibrosis - Per PCCM -Significantly improved noting has been able to tolerate trach collar since 7 AM on 8/9 as of the a.m. of 8/10 she remains on trach collar -?  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  These changes could also be seen in postinfectious or inflammatory scarring or recurrent chronic aspiration.  -As a precaution plan is to DC amiodarone in favor of alternate agent -Can always perform high-resolution CT of the chest later after any acute pulmonary issues resolved. -PCCM d/w myself and dtr that pt will have days where when she is napping she will require PRVC support -8/10 discussed with  Dr. Carlis Abbott.  She states patient is not stable enough from a respiratory standpoint to transfer out of the ICU just yet.   Acute on chronic combined CHF LV systolic dysfunction EF 45 to 50% Moderate RV systolic dysfunction Severe pulmonary hypertension 60 mmHg Mild to moderate MR and moderate TR:  -Cont BB; not on ARB/ACE inhibitor secondary to suboptimal blood pressure reading - continue Lasix per tube 40 mg daily -Obtain  BMETevery 48 hours -continue strict I's/O  Diarrhea with abdominal pain -Likely from TFs-recent XR w/o retained stool, no fever or leukocytosis -Add fiber for bulking -Imodium prn -Abd pain resolved  Permanent atrial fibrillation -Continue metoprolol- dose increased to 25 mg on 8/10, DC amiodarone -Potassium needs to be at least 4.0 therefore we will check electrolyte panel now and daily.  Potassium was 4.4 -Unable to begin Betapace given EKG on 8/8 with QTC 520 ms.  Repeat EKG 8/10 Qtc down to 494 but needs to be < 450 before initiation of Betapace.   -Eliquis resumed 8/9 -Use Zofran for nausea sparingly and avoid other QTC prolonging medications as we did with amiodarone  Diabetes mellitus 2 on long-term insulin; controlled -A1c 7.6% -Continue Semglee to 12 units daily -Continue SSI  Severe physical deconditioning: -Not candidate for LTAC given chronicity of debility -Plan is to discharge home-CM working on home health options and discussing in detail with daughter -Continue PT and  OT  Dysphagia s/p PEG: - Continue bolus tube feeds, free water.   History of CVA - Continue statin   Dementia: - 8/10 resume home Aricept   Seizure disorder, depression - Continue sertraline, keppra -Has history of TD thus rationale for Cymbalta prior to admission-resumed 8/3   Stage IIIa CKD:  -Currently better than that diagnosis would suggest. - Avoid nephrotoxins -Follow creatinine closely with IV diuresis -current eGFR >60   Acute hypokalemia: - Resolved    Anemia of chronic disease exacerbated by acute blood loss anemia due to hemoptysis (improving):  -Hgb stable at 8.8-possibly a degree of hemodilution from acute heart failure - Monitor intermittently-no further hemoptysis noted   Hypothyroidism:  -TSH 0.697 - Continue synthroid     Data Reviewed: Basic Metabolic Panel: Recent Labs  Lab 02/06/21 0310 02/08/21 0434 02/09/21 1246 02/10/21 0156 02/11/21 0301  NA 140 136 138 136 135  K 4.3 4.3 3.9 4.4 4.5  CL 107 105 102 103 101  CO2 $Re'27 23 26 26 27  'NtA$ GLUCOSE 83 154* 227* 167* 202*  BUN 27* 29* 25* 27* 25*  CREATININE 0.79 0.81 0.92 0.82 0.79  CALCIUM 8.8* 8.4* 9.2 8.9 8.8*    Liver Function Tests: No results for input(s): AST, ALT, ALKPHOS, BILITOT, PROT, ALBUMIN in the last 168 hours.  No results for input(s): LIPASE, AMYLASE in the last 168 hours. No results for input(s): AMMONIA in the last 168 hours. CBC: Recent Labs  Lab 02/09/21 1246 02/10/21 0156 02/11/21 0301  WBC 8.0 9.4 7.1  NEUTROABS 5.2 5.9 4.1  HGB 9.2* 9.4* 8.9*  HCT 30.1* 31.7* 28.7*  MCV 95.3 96.1 94.1  PLT 245 221 190    Cardiac Enzymes: No results for input(s): CKTOTAL, CKMB, CKMBINDEX, TROPONINI in the last 168 hours. BNP (last 3 results) Recent Labs    11/21/20 1656 12/21/20 0953 01/03/21 1527  BNP 425.0* 453.0* 614.0*     ProBNP (last 3 results) No results for input(s): PROBNP in the last 8760 hours.  CBG: Recent Labs  Lab 02/10/21 1926 02/10/21 2338 02/11/21 0326 02/11/21 0806 02/11/21 1102  GLUCAP 227* 220* 189* 137* 165*     Recent Results (from the past 240 hour(s))  MRSA Next Gen by PCR, Nasal     Status: None   Collection Time: 02/07/21 12:16 PM   Specimen: Nasal Mucosa; Nasal Swab  Result Value Ref Range Status   MRSA by PCR Next Gen NOT DETECTED NOT DETECTED Final    Comment: (NOTE) The GeneXpert MRSA Assay (FDA approved for NASAL specimens only), is one component of a comprehensive MRSA colonization  surveillance program. It is not intended to diagnose MRSA infection nor to guide or monitor treatment for MRSA infections. Test performance is not FDA approved in patients less than 12 years old. Performed at [facility], [place] [address] [place], [place] [address]       Scheduled Meds:  apixaban  5 mg Per Tube BID   atorvastatin  80 mg Per Tube Daily   chlorhexidine gluconate (MEDLINE KIT)  15 mL Mouth Rinse BID   Chlorhexidine Gluconate Cloth  6 each Topical Daily   donepezil  5 mg Per Tube QHS   DULoxetine  30 mg Oral BID   feeding supplement  237 mL Oral BID BM   feeding supplement ([place] STANDARD 1.4)  325 mL Per Tube QID   feeding supplement (PROSource TF)  45 mL Per Tube TID   fiber  1 packet Per Tube  BID   free water  100 mL Per Tube Q6H   furosemide  40 mg Per Tube Daily   gabapentin  100 mg Per Tube Q8H   Gerhardt's butt cream  1 application Topical BID   insulin aspart  0-15 Units Subcutaneous Q4H   insulin glargine-yfgn  12 Units Subcutaneous Daily   levETIRAcetam  1,000 mg Per Tube BID   levothyroxine  175 mcg Per Tube Q0600   mouth rinse  15 mL Mouth Rinse 10 times per day   metoprolol tartrate  25 mg Per Tube BID   pantoprazole sodium  40 mg Per Tube Daily   potassium chloride  20 mEq Per Tube Daily   sertraline  25 mg Per Tube Daily     Active Problems:   Acute respiratory failure with hypoxia and hypercapnia (HCC)   Acute on chronic respiratory failure with hypoxia and hypercapnia (HCC)   Hypercapnic respiratory failure (HCC)   Status post tracheostomy ([place])   Encephalopathy acute   Hemoptysis   Tracheostomy dependent (HCC)   Physical deconditioning   Insulin dependent type 2 diabetes mellitus, controlled ([place])   Ventilator dependence ([place])   Consultants: PCCM Interventional radiology Video-assisted bronchoscopy  Procedures: Echocardiogram Tracheostomy tube PEG tube  Antibiotics: Cefepime 7/3 through 7/8 Azithromycin 7/3  through 7/7 Vancomycin 7/3, 7/4 Cefazolin 7/11 x 1 dose Cefepime 7/27 through 7/31   Time spent: 25 minutes    [place] [facility]  Pager: Secure chat 7p - 7a -please see Amion  39  days

## 2021-02-11 NOTE — Progress Notes (Signed)
RT NOTE: patient taken off of ventilator and placed on 35% ATC.  Currently tolerating well.  Will continue to monitor.

## 2021-02-11 NOTE — Progress Notes (Signed)
Inpatient Diabetes Program Recommendations  AACE/ADA: New Consensus Statement on Inpatient Glycemic Control (2015)  Target Ranges:  Prepandial:   less than 140 mg/dL      Peak postprandial:   less than 180 mg/dL (1-2 hours)      Critically ill patients:  140 - 180 mg/dL   Lab Results  Component Value Date   GLUCAP 137 (H) 02/11/2021   HGBA1C 7.6 (H) 11/22/2020    Review of Glycemic Control Results for PINKEY, MCJUNKIN (MRN [id number]) as of 02/11/2021 10:59  Ref. Range 02/10/2021 07:22 02/10/2021 11:42 02/10/2021 15:15 02/10/2021 19:26 02/10/2021 23:38 02/11/2021 03:26 02/11/2021 08:06  Glucose-Capillary Latest Ref Range: 70 - 99 mg/dL 84 224 (H) 825 (H) 003 (H) 220 (H) 189 (H) 137 (H)    Current orders for Inpatient glycemic control:  Semglee 12 units Novolog 0-15 units Q4 hours  [place] standard 1.4 325 ml 4x/day  Inpatient Diabetes Program Recommendations:    Glucose trends increasing after bolus feed times  -   Pt may benefit from novolog 4 units 4x/day to line up with bolus tube feeds 90800, 1200, 1600, 2000)  Thanks,  Christena Deem RN, MSN, BC-ADM Inpatient Diabetes Coordinator Team Pager [phone] (8a-5p)

## 2021-02-11 NOTE — Progress Notes (Signed)
Nutrition Follow-up  DOCUMENTATION CODES:   Obesity unspecified  INTERVENTION:   Add Ensure Enlive po BID to maximize oral intake, each supplement provides 350 kcal and 20 grams of protein.  Continue bolus TF via PEG: [employer] Standard 1.4, 325 ml 4 times per day. Recommend bolus slowly over 15-20 minutes to promote tolerance. Prosource TF 45 ml TID.  Provides 1940 kcal, 113 gm protein daily, 936 ml free water daily.  Continue free water flushes 100 ml every 6 hours for a total of 1336 ml free water daily.  NUTRITION DIAGNOSIS:   Inadequate oral intake related to inability to eat as evidenced by NPO status.  Ongoing   GOAL:   Patient will meet greater than or equal to 90% of their needs  Met with TF  MONITOR:   Weight trends, TF tolerance, Labs, Vent status, Skin, I & O's  REASON FOR ASSESSMENT:   Consult Assessment of nutrition requirement/status, Enteral/tube feeding initiation and management  ASSESSMENT:   Pt with a PMH including T2DM, Afib, dementia, CHF, seizure disorder, CKD stage 3, CAD w/ prior NSTEMI, HTN, hypothyrodism, and dyslipidemia admitted with acute combined hypoxic and hypercapnic respiratory failure 2/2 CHF, HTN, restrictive lung disease 2/2 kyphosis. Note pt recently admitted from 6/20-6/29 for acute on chronic hypoxic hypercapnic respiratory failure 2/2 CHF exacerbations.  Discussed patient in ICU rounds and with RN today. Currently on trach collar. Goal is trach collar during the day and vent support at night. Daughter to train on home vent use Friday.   S/P FEES 8/10. Diet advanced to dysphagia 1 with thin liquids. Suspect oral intake will be limited. Recommend continue TF via PEG for now.   G-tube placed 7/11. Bolus feedings via PEG initiated 7/12. Patient has been tolerating bolus TF without difficulty: [employer] standard 1.4 325 ml QID with Prosource TF 45 ml TID. Receiving free water flushes 100 ml every 6 hours.  Patient requiring  ventilator support at night. MV: 9.9 L/min Temp (24hrs), Avg:98.2 F (36.8 C), Min:97.7 F (36.5 C), Max:98.6 F (37 C)   Labs reviewed.  CBG: 189-137  Medications reviewed and include aricept, nutrisource fiber BID, lasix, novolog, semglee, keppra, protonix, potassium chloride.  Admission weight 106.1 kg Current weight 88.1 kg on 8/11 (Lowest weight since admission)   I/O -1.17 L since admission  Diet Order:   Diet Order             DIET - DYS 1 Room service appropriate? Yes; Fluid consistency: Thin  Diet effective now                   EDUCATION NEEDS:   Not appropriate for education at this time  Skin:  Skin Assessment: Skin Integrity Issues: Other: skin tear R arm; MASD groin  Last BM:  8/10 type 7, rectal tube  Height:   Ht Readings from Last 1 Encounters:  01/03/21 5' 8" (1.727 m)    Weight:   Wt Readings from Last 1 Encounters:  02/11/21 88.1 kg    Ideal Body Weight:  63.63 kg  BMI:  Body mass index is 29.53 kg/m.  Estimated Nutritional Needs:   Kcal:  1800-2000  Protein:  110-130 gm  Fluid:  >/= 1.8 L     H, RD, LDN, CNSC Please refer to Amion for contact information.                                                        

## 2021-02-11 NOTE — Progress Notes (Signed)
Occupational Therapy Treatment Patient Details Name: [name] MRN: [id number] DOB: [birth year] Today's Date: 02/11/2021    History of present illness 70 y.o. female admitted 01/03/21 with acute on chronic respiratory failure with hypoxia. Intubated 7/3-present. 7/7: trachostomy placement. 7/27 occlusion of left lower lobe, trach replaced. PMHx:obesity, T2DM, chronic atrial fibrillation on Eliquis, dementia, diastolic CHF with chronic hypoxemia on 2 L nasal cannula, hypothyroidism, seizure disorder, CKD stage III, CAD with prior NSTEMI, dyslipidemia, and GERD.   OT comments  Patient making great progress toward goals this date. Daughter able to deflate cuff and don passy muir valve in prep for treatment session. Patient progressed from supine to EOB with Min A and increased time and several sit to stand transfers from various surfaces with Min A +2. Patient/family education provided on BUE HEP. Written handout to be provided at time of next treatment session. OT will continue to follow acutely.    Follow Up Recommendations  Home health OT;Supervision/Assistance - 24 hour    Equipment Recommendations  Hospital bed;Wheelchair (measurements OT);Wheelchair cushion (measurements OT)    Recommendations for Other Services      Precautions / Restrictions Precautions Precautions: Fall Precaution Comments: FiO2 35% on trach collar; ok to place passy muir valve on for therapies Restrictions Weight Bearing Restrictions: No       Mobility Bed Mobility Overal bed mobility: Needs Assistance Bed Mobility: Sidelying to Sit;Rolling Rolling: Min assist Sidelying to sit: HOB elevated;Min assist       General bed mobility comments: Patient able to advance BLE toward EOB with increased time. Min A to bring trunk upright with HOB elevated.    Transfers Overall transfer level: Needs assistance Equipment used: Rolling walker (2 wheeled) Transfers: Sit to/from [employer] Sit to  Stand: +2 physical assistance;Min assist;+2 safety/equipment Stand pivot transfers: +2 safety/equipment;Min assist;+2 physical assistance       General transfer comment: Min A +2 for sit to stand from ICU bed and sit to stand x3 from built-up recliner. Required heavy Min A +2 from sit to stand from low recliner before pillows place to build up.    Balance Overall balance assessment: Needs assistance Sitting-balance support: Bilateral upper extremity supported Sitting balance-Leahy Scale: Fair Sitting balance - Comments: Supervision A for static sitting at EOB.   Standing balance support: Bilateral upper extremity supported;During functional activity Standing balance-Leahy Scale: Poor Standing balance comment: Reliant on BUE support on RW.                           ADL either performed or assessed with clinical judgement   ADL Overall ADL's : Needs assistance/impaired                 Upper Body Dressing : Moderate assistance;Sitting Upper Body Dressing Details (indicate cue type and reason): Donned anterior hospital gown.     Toilet Transfer: Minimal assistance;+2 for physical assistance;+2 for safety/equipment;RW Toilet Transfer Details (indicate cue type and reason): Min A +2 with use of RW; simulated with transfer to recliner.         Functional mobility during ADLs: Minimal assistance;+2 for physical assistance;+2 for safety/equipment General ADL Comments: Making great progress toward goals.     Vision       Perception     Praxis      Cognition Arousal/Alertness: Awake/alert Behavior During Therapy: WFL for tasks assessed/performed Overall Cognitive Status: Impaired/Different from baseline Area of Impairment: Awareness;Attention  Orientation Level:  (NT) Current Attention Level: Sustained   Following Commands: Follows one step commands consistently   Awareness: Emergent   General Comments: Follows 1-step verbal  commands with good accuracy this date        Exercises General Exercises - Upper Extremity Shoulder Flexion: AROM;Both;5 reps;Seated Elbow Flexion: AROM;Both;5 reps;Seated Elbow Extension: AROM;Both;5 reps;Seated   Shoulder Instructions       General Comments Daughter present at bedside throughout.    Pertinent Vitals/ Pain       Pain Assessment: No/denies pain  Home Living                                          Prior Functioning/Environment              Frequency  Min 2X/week        Progress Toward Goals  OT Goals(current goals can now be found in the care plan section)  Progress towards OT goals: Progressing toward goals  Acute Rehab OT Goals Patient Stated Goal: To get stronger OT Goal Formulation: With patient/family Time For Goal Achievement: 02/17/21 Potential to Achieve Goals: Fair ADL Goals Pt Will Perform Eating: with set-up;sitting Pt Will Perform Grooming: with set-up;sitting Pt Will Perform Upper Body Bathing: with min assist;bed level;sitting Pt Will Perform Upper Body Dressing: with min assist;with mod assist;sitting;bed level Additional ADL Goal #1: Patient will tolerate sitting at EOB for 8-10 minutes with close supervision A in prep for seated grooming tasks. Additional ADL Goal #2: Patient will tolerate 10-15 minutes of therapeutic activity with good alertness/arousal indicating increased activity tolerance. Additional ADL Goal #3: Patient will compelte sit to stand transfers with Mod A and use of RW in prep for ADL transfers.  Plan Frequency remains appropriate;Discharge plan needs to be updated    Co-evaluation                 AM-PAC OT "6 Clicks" Daily Activity     Outcome Measure   Help from another person eating meals?: Total (PEG) Help from another person taking care of personal grooming?: A Lot Help from another person toileting, which includes using toliet, bedpan, or urinal?: A Lot Help from another  person bathing (including washing, rinsing, drying)?: A Lot Help from another person to put on and taking off regular upper body clothing?: A Lot Help from another person to put on and taking off regular lower body clothing?: A Lot 6 Click Score: 11    End of Session Equipment Utilized During Treatment: Gait belt;Oxygen;Other (comment) (8L and 35% FiO2)  OT Visit Diagnosis: Muscle weakness (generalized) (M62.81)   Activity Tolerance Patient tolerated treatment well;Patient limited by fatigue   Patient Left in chair;with call bell/phone within reach;with chair alarm set   Nurse Communication Mobility status;Other (comment) (Response to treatment)        Time: 8144-8185 OT Time Calculation (min): 23 min  Charges: OT General Charges $OT Visit: 1 Visit OT Treatments $Therapeutic Activity: 8-22 mins $Neuromuscular Re-education: 8-22 mins  Beckam Abdulaziz H. OTR/L Supplemental OT, Department of rehab services [phone]   Jamisen Duerson R H. 02/11/2021, 1:25 PM

## 2021-02-11 NOTE — Progress Notes (Signed)
  Speech Language Pathology Treatment: Dysphagia;Passy Muir Speaking valve  Patient Details Name: [name] MRN: [id number] DOB: 10-26-1950 Today's Date: 02/11/2021 Time: 4481-8563 SLP Time Calculation (min) (ACUTE ONLY): 19 min  Assessment / Plan / Recommendation Clinical Impression  Pt did very well with eating/swallowing and vocalizing today.  With valve in place, her vocal intensity was normal and quality was much improved. VS were stable. Speech was fully intelligible at conversational levels, and [name] was interactive and smiling today.  Breath support for speech was normal. She consumed several bites of applesauce and drank water with no s/s of aspiration. She continues to require some cueing to slow down.    Her dtr, [name], will bring in dentures tomorrow afternoon to determine if she is ready to advance diet.  SLP will continue to follow.   HPI HPI: Pt is a 70 y/o female presented to [facility] on 7/03 with dyspnea and altered mental status.  Transferred to [facility]; failed BiPAP and required intubation. 7/7 trach; 7/11 PEG. Dx acute on chronic hypoxic/hypercapnic respiratory failure from acute pulmonary edema, pneumonia; severe deconditioning; metabolic encephalopathy.  Had admission from 12/21/20 to 12/30/20 for respiratory failure and CHF exacerbation; was on dysphagia 3 and thin liquids on 6/27. PMHx: Kyphoscoliosis, Restrictive lung disease, Chronic respiratory failure on 2 liters, DM type 2, Chronic a fib on eliquis, Dementia, Chronic combined CHF, Hypothyroidism, Seizures, CKD 3a, CAD s/p NSTEMI, HLD, GERD.      SLP Plan  Continue with current plan of care       Recommendations  Diet recommendations: Dysphagia 1 (puree);Thin liquid Liquids provided via: Cup;Straw Supervision: Full supervision/cueing for compensatory strategies Compensations: Slow rate Postural Changes and/or Swallow Maneuvers: Seated upright 90 degrees      Patient may use Passy-Muir Speech Valve: During all  therapies with supervision PMSV Supervision: Full         Oral Care Recommendations: Oral care BID Follow up Recommendations: Home health SLP SLP Visit Diagnosis: Dysphagia, oropharyngeal phase (R13.12) Plan: Continue with current plan of care       GO              [name] L. Tara Frederic, MA CCC/SLP Acute Rehabilitation Services Office number [phone] Pager [phone]   [name] 02/11/2021, 3:36 PM

## 2021-02-12 LAB — CBC WITH DIFFERENTIAL/PLATELET
Abs Immature Granulocytes: 0.05 10*3/uL (ref 0.00–0.07)
Basophils Absolute: 0.1 10*3/uL (ref 0.0–0.1)
Basophils Relative: 1 %
Eosinophils Absolute: 0.2 10*3/uL (ref 0.0–0.5)
Eosinophils Relative: 2 %
HCT: 29.1 % — ABNORMAL LOW (ref 36.0–46.0)
Hemoglobin: 9 g/dL — ABNORMAL LOW (ref 12.0–15.0)
Immature Granulocytes: 1 %
Lymphocytes Relative: 30 %
Lymphs Abs: 2.4 10*3/uL (ref 0.7–4.0)
MCH: 28.9 pg (ref 26.0–34.0)
MCHC: 30.9 g/dL (ref 30.0–36.0)
MCV: 93.6 fL (ref 80.0–100.0)
Monocytes Absolute: 0.8 10*3/uL (ref 0.1–1.0)
Monocytes Relative: 10 %
Neutro Abs: 4.5 10*3/uL (ref 1.7–7.7)
Neutrophils Relative %: 56 %
Platelets: 225 10*3/uL (ref 150–400)
RBC: 3.11 MIL/uL — ABNORMAL LOW (ref 3.87–5.11)
RDW: 18.7 % — ABNORMAL HIGH (ref 11.5–15.5)
WBC: 8 10*3/uL (ref 4.0–10.5)
nRBC: 0 % (ref 0.0–0.2)

## 2021-02-12 LAB — BASIC METABOLIC PANEL
Anion gap: 10 (ref 5–15)
BUN: 24 mg/dL — ABNORMAL HIGH (ref 8–23)
CO2: 27 mmol/L (ref 22–32)
Calcium: 9.1 mg/dL (ref 8.9–10.3)
Chloride: 100 mmol/L (ref 98–111)
Creatinine, Ser: 0.78 mg/dL (ref 0.44–1.00)
GFR, Estimated: >60 mL/min (ref >60)
Glucose, Bld: 149 mg/dL — ABNORMAL HIGH (ref 70–99)
Potassium: 4.1 mmol/L (ref 3.5–5.1)
Sodium: 137 mmol/L (ref 135–145)

## 2021-02-12 LAB — GLUCOSE, CAPILLARY
Glucose-Capillary: 143 mg/dL — ABNORMAL HIGH (ref 70–99)
Glucose-Capillary: 132 mg/dL — ABNORMAL HIGH (ref 70–99)
Glucose-Capillary: 195 mg/dL — ABNORMAL HIGH (ref 70–99)
Glucose-Capillary: 246 mg/dL — ABNORMAL HIGH (ref 70–99)
Glucose-Capillary: 219 mg/dL — ABNORMAL HIGH (ref 70–99)
Glucose-Capillary: 127 mg/dL — ABNORMAL HIGH (ref 70–99)

## 2021-02-12 MED ORDER — AMIODARONE HCL 200 MG PO TABS
200.0000 mg | ORAL_TABLET | Freq: Every morning | ORAL | Status: DC
  Administered 2021-02-12 – 2021-02-15 (×4): 200 mg via ORAL
  Filled 2021-02-12 (×4): qty 1

## 2021-02-12 NOTE — Progress Notes (Signed)
SLP Cancellation Note  Patient Details Name: [name] MRN: [id number] DOB: 07-09-1950   Cancelled treatment:     Pt exhausted after busy day.  [name] brought in her dentures to allow potential for diet advancement from Dysphagia 1.  Will try to f/u either Sat or Sun if scheduling permits given potential for D/C home on Monday.  Gerell Fortson L. Samson Frederic, MA CCC/SLP Acute Rehabilitation Services Office number [phone] Pager [phone]       [name] 02/12/2021, 3:48 PM

## 2021-02-12 NOTE — Progress Notes (Signed)
Occupational Therapy Treatment Patient Details Name: [name] MRN: [id number] DOB: Mar 17, 1951 Today's Date: 02/12/2021    History of present illness 70 y.o. female admitted 01/03/21 with acute on chronic respiratory failure with hypoxia. Intubated 7/3-present. 7/7: trachostomy placement. 7/27 occlusion of left lower lobe, trach replaced. PMHx:obesity, T2DM, chronic atrial fibrillation on Eliquis, dementia, diastolic CHF with chronic hypoxemia on 2 L nasal cannula, hypothyroidism, seizure disorder, CKD stage III, CAD with prior NSTEMI, dyslipidemia, and GERD.   OT comments  Patient met lying supine in bed in agreement with OT treatment session. Patient requiring Min A for bed mobility this date with HOB elevated. At EOB, patient able to don bilateral socks in figure-4 position with close supervision A and increased time. Patient completed toilet transfer to [facility] with Min A and use of RW. Functional mobility up to 39f with RW and increased time on 6L via trach collar and FiO2 35%. Spo2 98%. HR in 80's throughout. Patient/family education on energy conservation, home set-up and compensatory strategies for bathing/dressing/toileting upon return home. OT will continue to follow acutely in prep for d/c next week.    Follow Up Recommendations  Home health OT;Supervision/Assistance - 24 hour    Equipment Recommendations  Hospital bed;Wheelchair (measurements OT);Wheelchair cushion (measurements OT)    Recommendations for Other Services      Precautions / Restrictions Precautions Precautions: Fall Precaution Comments: FiO2 35% on trach collar; ok to place passy muir valve on for therapies Restrictions Weight Bearing Restrictions: No       Mobility Bed Mobility Overal bed mobility: Needs Assistance Bed Mobility: Sidelying to Sit;Rolling Rolling: Min assist Sidelying to sit: HOB elevated;Min assist       General bed mobility comments: Patient able to advance BLE toward EOB with increased  time. Min A to bring trunk upright with HOB elevated. Requires increased time/effort.    Transfers Overall transfer level: Needs assistance Equipment used: Rolling walker (2 wheeled) Transfers: Sit to/from SOmnicareSit to Stand: Min assist;Mod assist Stand pivot transfers: Min assist       General transfer comment: Min A [facility]    Balance Overall balance assessment: Needs assistance Sitting-balance support: Bilateral upper extremity supported Sitting balance-Leahy Scale: Fair Sitting balance - Comments: Supervision A for static sitting at EOB. No LOB with reaching outside of BOS.   Standing balance support: Bilateral upper extremity supported;During functional activity Standing balance-Leahy Scale: Poor Standing balance comment: Reliant on BUE support on RW.                           ADL either performed or assessed with clinical judgement   ADL Overall ADL's : Needs assistance/impaired                     Lower Body Dressing: Moderate assistance;Sit to/from stand Lower Body Dressing Details (indicate cue type and reason): Able to don footwear seated EOB with set-up assist and increased time. Toilet Transfer: Minimal aInsurance claims handlerDetails (indicate cue type and reason): Min A to [facility]Con[facility] Incwith use of RW. Cues for hand placement.         Functional mobility during ADLs: Minimal assistance General ADL Comments: Making great progress toward goals.     Vision       Perception     Praxis      Cognition Arousal/Alertness: Awake/alert Behavior During Therapy: WFL for tasks assessed/performed Overall  Cognitive Status: Impaired/Different from baseline Area of Impairment: Awareness;Attention                 Orientation Level:  (NT) Current Attention Level: Sustained   Following Commands: Follows one step commands consistently   Awareness: Emergent   General Comments:  Follows 1-step verbal commands with good accuracy this date        Exercises     Shoulder Instructions       General Comments Daughter present at bedside.    Pertinent Vitals/ Pain       Pain Assessment: Faces Faces Pain Scale: Hurts a little bit Pain Location: Generalized with movement Pain Descriptors / Indicators: Sore Pain Intervention(s): Limited activity within patient's tolerance;Monitored during session;Repositioned  Home Living                                          Prior Functioning/Environment              Frequency  Min 2X/week        Progress Toward Goals  OT Goals(current goals can now be found in the care plan section)  Progress towards OT goals: Progressing toward goals  Acute Rehab OT Goals Patient Stated Goal: To get stronger OT Goal Formulation: With patient/family Time For Goal Achievement: 02/17/21 Potential to Achieve Goals: Fair ADL Goals Pt Will Perform Eating: with set-up;sitting Pt Will Perform Grooming: with set-up;sitting Pt Will Perform Upper Body Bathing: with min assist;bed level;sitting Pt Will Perform Upper Body Dressing: with min assist;with mod assist;sitting;bed level Additional ADL Goal #1: Patient will tolerate sitting at EOB for 8-10 minutes with close supervision A in prep for seated grooming tasks. Additional ADL Goal #2: Patient will tolerate 10-15 minutes of therapeutic activity with good alertness/arousal indicating increased activity tolerance. Additional ADL Goal #3: Patient will compelte sit to stand transfers with Mod A and use of RW in prep for ADL transfers.  Plan Frequency remains appropriate;Discharge plan needs to be updated    Co-evaluation                 AM-PAC OT "6 Clicks" Daily Activity     Outcome Measure   Help from another person eating meals?: A Little Help from another person taking care of personal grooming?: A Lot Help from another person toileting, which  includes using toliet, bedpan, or urinal?: A Lot Help from another person bathing (including washing, rinsing, drying)?: A Lot Help from another person to put on and taking off regular upper body clothing?: A Lot Help from another person to put on and taking off regular lower body clothing?: A Lot 6 Click Score: 13    End of Session Equipment Utilized During Treatment: Gait belt;Oxygen;Other (comment) (6L and 35% FiO2)  OT Visit Diagnosis: Muscle weakness (generalized) (M62.81)   Activity Tolerance Patient tolerated treatment well;Patient limited by fatigue   Patient Left in chair;with call bell/phone within reach;with family/visitor present   Nurse Communication Mobility status;Other (comment) (Response to treatment; ability to transfer to [facility] for toileting.)        Time: 1330-1359 OT Time Calculation (min): 29 min  Charges: OT General Charges $OT Visit: 1 Visit OT Treatments $Self Care/Home Management : 8-22 mins $Therapeutic Activity: 8-22 mins  Ogden Handlin H. OTR/L Supplemental OT, Department of rehab services [phone]   Julieanna Geraci R H. 02/12/2021, 2:14 PM

## 2021-02-12 NOTE — Progress Notes (Signed)
Spoke with Dr. Natale Milch this morning regarding getting the patient ready to go home. This RN to discontinue cardiac monitoring and transitioning to a more realistic "what it would be like for her at home."

## 2021-02-12 NOTE — TOC Progression Note (Signed)
Transition of Care [facility]) - Progression Note    Patient Details  Name: [name] MRN: [id number] Date of Birth: 1950-08-30  Transition of Care [facility]) CM/SW [place], RN Phone Number: 02/12/2021, 11:14 AM  Clinical Narrative:    Case management met with the patient at the bedside along with Autumn, 2 M RN.  The patient is progressing well on the trach collar and respiratory effort and stability at this time.  Transitions of care plan is to progress the patient  to discharging the patient home with family and home health services on Monday or Tuesday of this coming week, most likely on 02/15/2021 or 02/16/21, once the daughter and husband have received bedside teaching for home and overnight stays over the weekend and feel comfortable to care for the patient at home.  The daughter has visited with the patient daily at the bedside and has participated with care needs for this patient during this admission to the hospital per Autumn, RN and nursing staff.  Dr. Avon Gully is aware of the plan to discharge next week once the family is comfortable caring for the patient at the bedside.  I called the daughter and Hubbard Robinson, RT with [employer] [phone] the home ventilator will arrive today at 2 pm and the nursing staff is aware that the patient's daughter will be providing care to the patient overnight - in hopes of taking her home next week.  I spoke with the daughter, [name] and she would like to spend the night either Saturday or Sunday night as well to complete teaching and care of the patient.  CM called and spoke with Meg, RNCM with [facility] services and they are able to provide [place] and PT after the patient is discharged home from the hospital.  [employer] with supply 24-hour telephone support to the patient for RT support.  Wilfred Curtis, RT with [employer] will meet with the patient's daughter today to continue home ventilator teaching and will follow up with the patient at  the home as well when she is discharged.  Respiratory supplies including suction equipment, ventilator, tracheostomy supplies and other respiratory equipment will be provided through [employer] dme. [employer] will deliver the electric hospital bed to the home this weekend and daughter has hoyer lift present at the home.  The daughter also has [place] supplies at home.  Raquel Sarna, CM at [facility] was notified that the patient is progressing well and does not need a bridge admission to the facility prior to discharging home at this time and critical care medicine, nursing and Dr. Avon Gully are aware that this was cancelled.  CM and MSW will continue to follow the patient for discharge to home first of next week if medically stable per physician services including critical care and medical team physician group.     Expected Discharge Plan: [place] Barriers to Discharge: Continued Medical Work up  Expected Discharge Plan and Services Expected Discharge Plan: [place] In-house Referral: Clinical Social Work, Hospice / Palliative Care, PCP / Health Connect Discharge Planning Services: CM Consult Post Acute Care Choice: Durable Medical Equipment, Home Health Living arrangements for the past 2 months: [place]                 DME Arranged: Hospital bed (home ventilator supplied through [facility]) DME Agency: [employer] Date DME Agency Contacted: 02/12/21 Time DME Agency Contacted: [place] Representative spoke with at DME Agency: Hubbard Robinson /  Lyndee Leo, RT with [employer], Bethanne Ginger - electric hospital bed and respiratory supplies HH Arranged: RN, PT [facility] Agency: Other - See comment ([place]) Date [facility] Agency Contacted: 02/12/21 Time HH Agency Contacted: 0800 Representative spoke with at [facility]ARS: [place] with [place]   Social Determinants of Health (SDOH) Interventions    Readmission Risk Interventions Readmission Risk Prevention Plan  12/28/2020 11/24/2020 09/21/2020  Transportation Screening Complete Complete Complete  HRI or Home Care Consult - Complete -  Social Work Consult for [place] Planning/Counseling - Complete -  Palliative Care Screening - Not Applicable -  Medication Review Press photographer) Complete Complete Complete  PCP or Specialist appointment within 3-5 days of discharge - - Complete  HRI or Home Care Consult Complete - Complete  SW Recovery Care/Counseling Consult Complete - Complete  Palliative Care Screening (No Data) - Complete  [place] Patient Refused - Complete  Some recent data might be hidden

## 2021-02-12 NOTE — Progress Notes (Signed)
Physical Therapy Treatment Patient Details Name: TORRIN FREIN MRN: [id number] DOB: 11-Jul-1950 Today's Date: 02/12/2021    History of Present Illness 70 y.o. female admitted 01/03/21 with acute on chronic respiratory failure with hypoxia. Intubated 7/3-present. 7/7: trachostomy placement. 7/27 occlusion of left lower lobe, trach replaced. PMHx:obesity, T2DM, chronic atrial fibrillation on Eliquis, dementia, diastolic CHF with chronic hypoxemia on 2 L nasal cannula, hypothyroidism, seizure disorder, CKD stage III, CAD with prior NSTEMI, dyslipidemia, and GERD.    PT Comments    Pt making excellent progress today.  She had met all goals and they were updated.  Pt ambulated 30'x2 with RW min A and chair follow on 35% trach collar sats 100%.  Daughter present and very supportive. Continue to progress as able.     Follow Up Recommendations  LTACH;Home health PT;Supervision/Assistance - 24 hour (Family declined LTACH/SNF and plan to take pt home)     Equipment Recommendations  Wheelchair (measurements PT);Wheelchair cushion (measurements PT);Hospital bed;Rolling walker with 5" wheels    Recommendations for Other Services       Precautions / Restrictions Precautions Precautions: Fall Precaution Comments: FiO2 35% on trach collar; ok to place passy muir valve on for therapies Restrictions Weight Bearing Restrictions: No    Mobility  Bed Mobility Overal bed mobility: Needs Assistance Bed Mobility: Sit to Supine Rolling: Min assist Sidelying to sit: HOB elevated;Min assist   Sit to supine: Min assist   General bed mobility comments: In chair at arrival    Transfers Overall transfer level: Needs assistance Equipment used: Rolling walker (2 wheeled) Transfers: Sit to/from Stand Sit to Stand: Min assist;Mod assist Stand pivot transfers: Min assist       General transfer comment: First 2 stands from chair mod A but on 3rd stand min A.  Good use of hands and leaning  forward  Ambulation/Gait Ambulation/Gait assistance: Min assist;+2 safety/equipment Gait Distance (Feet): 30 Feet (30'x2 then 5') Assistive device: Rolling walker (2 wheeled) Gait Pattern/deviations: Step-to pattern;Decreased stride length;Trunk flexed Gait velocity: decreased   General Gait Details: Cues for RW proximity and daughter provided chair follow. Pt ambulated 30' x 2 with seated rest breaks then 5 ' back to bed. She had 1 LOB when stepping over raised metal floor divider.   Stairs             Wheelchair Mobility    Modified Rankin (Stroke Patients Only)       Balance Overall balance assessment: Needs assistance Sitting-balance support: No upper extremity supported Sitting balance-Leahy Scale: Good Sitting balance - Comments: Supervision sitting edge of chair   Standing balance support: Bilateral upper extremity supported Standing balance-Leahy Scale: Poor Standing balance comment: REquiring RW and min guard                            Cognition Arousal/Alertness: Awake/alert Behavior During Therapy: WFL for tasks assessed/performed Overall Cognitive Status: Difficult to assess Area of Impairment: Awareness;Attention                 Orientation Level:  (NT) Current Attention Level: Sustained   Following Commands: Follows one step commands consistently   Awareness: Emergent   General Comments: Follows 1-step verbal commands with good accuracy this date      Exercises      General Comments General comments (skin integrity, edema, etc.): Pt on 35% 8 L trach collar with sats 100%; daughter present      Pertinent Vitals/Pain Pain  Assessment: No/denies pain Faces Pain Scale: Hurts a little bit Pain Location: Generalized with movement Pain Descriptors / Indicators: Sore Pain Intervention(s): Limited activity within patient's tolerance;Monitored during session;Repositioned    Home Living                      Prior  Function            PT Goals (current goals can now be found in the care plan section) Acute Rehab PT Goals Patient Stated Goal: return home PT Goal Formulation: With patient/family Time For Goal Achievement: 02/26/21 Potential to Achieve Goals: Good Progress towards PT goals: Goals met and updated - see care plan    Frequency    Min 3X/week      PT Plan Current plan remains appropriate    Co-evaluation              AM-PAC PT "6 Clicks" Mobility   Outcome Measure  Help needed turning from your back to your side while in a flat bed without using bedrails?: A Little Help needed moving from lying on your back to sitting on the side of a flat bed without using bedrails?: A Little Help needed moving to and from a bed to a chair (including a wheelchair)?: A Little Help needed standing up from a chair using your arms (e.g., wheelchair or bedside chair)?: A Lot Help needed to walk in hospital room?: A Little Help needed climbing 3-5 steps with a railing? : Total 6 Click Score: 15    End of Session Equipment Utilized During Treatment: Gait belt;Oxygen Activity Tolerance: Patient tolerated treatment well Patient left: in bed;with call bell/phone within reach;with family/visitor present;with nursing/sitter in room Nurse Communication: Mobility status PT Visit Diagnosis: Unsteadiness on feet (R26.81);Muscle weakness (generalized) (M62.81);Difficulty in walking, not elsewhere classified (R26.2)     Time: 9507-2257 PT Time Calculation (min) (ACUTE ONLY): 24 min  Charges:  $Gait Training: 8-22 mins $Therapeutic Activity: 8-22 mins                     Abran Richard, PT Acute Rehab Services Pager [phone] [facility] [place] 02/12/2021, 4:12 PM

## 2021-02-12 NOTE — Progress Notes (Signed)
[facility] PROGRESS NOTE  BRAILYNN BRETH ION:[id number] DOB: 03/29/1951 DOA: 01/03/2021 PCP: Lemmie Evens, MD   Status: Remains inpatient appropriate because: -Unsafe disposition, pending family training and competence with new devices - transition home in the next few days to week is certainly reasonable.  Dispo: The patient is from: Home              Anticipated d/c is to: Home pending family training and competence with new equipment              Patient currently is not medically stable to d/c.   Difficult to place patient No  Level of care: ICU 2/2 friable pulmonary mucosa/new trach/requirement for PRN and nocturnal ventilator  Code Status: Full Family Communication: Daughter at bedside on 8/2 DVT prophylaxis: Subcu heparin COVID vaccination status: Not documented  HPI: 70 yo female who presented to [facility] on 7/03 with dyspnea, and altered mental status. Had admission from 12/21/20 to 12/30/20 for respiratory failure and CHF exacerbation.Transferred to [facility]. SpO2 65% and elevated PCO2.  Failed Bipap and required intubation. Course since then complicated by prolonged mechanical ventilation requiring tracheostomy 7/7. She has been managed by PCCM for 4 weeks, deemed to require home vent which is delayed by need for generator at home. This is being arranged. She was transferred to hospitalist service on 02/01/2021.   Significant events: 7/3  chronic HCRF from OHS and restrictive lung dz as well as deconditioning-->progression -->decompensated HFpEF and cor pulmonale-->worsening metabolic enceph from HCRF-->PNA --> acute on chronic resp failure. Admitted. Intubated. Ceftriaxone 7/3 x 1. Azith and Cefepime started. 7/4-7/5 Hypotension overnight. A line placed. 526m LR bolus. Started on [place] 7/6 Hgb 6.7 (7.4), CT A/P completed to r/o RP bleed (negative), showed mild peripancreatic stranding. Lipase slightly elevated. 1U PRBCs ordered. 7/7 Tracheostomy placed, heparin gtt  held for procedure. 7/8 Increased agitation with Propofol wean, increased HR, uptrending pressor requirements. Cortrak placed. Azith and cefepime stopped. 7/11 PEG tube placed, tolerating PSV 7/12 ATC trials started. Tubefeeds started/resumed 7/13 solucortef decreased to daily. Midodrine stopped. Heparin gtt stopped. Stated back on DOAC. PANDA removed 7/14: sat at side of bed w/ max assist for 15 minutes. Fatigued quickly. PT recommending LTAC setting 7/15: Tolerated trach collar yesterday 7/16: TCT x 2 hours 7/18 hgb 6.8 received 1 unit PRBC. LTACH appeal denied 7/22 Tolerating brief periods of trach collar and PMV, Periods of emesis >> TFs held, Bright red blood noted by RT 7/23 left atelectasis, bronchoscopy showing inspissated clot occluding distal trachea, needed cryo probe to remove clot down to left mainstem 7/20 4 repeat bronchoscopy with cryoprobe to remove residual clot left upper lobe and lower lobe, oozing of underlying mucosa, distal clot remains 7/23 atx due to clot. Trach replaced with ETT for purpose of bronchoscopy. Inspissated clot suctioned. No obvious proximal bleeding source identified. Required TXA nebs and Cryoprobe 7/25 still w/ bloody trach secretions. Tolerating CPAP 7/26 hgb down 6.9 1 u PRBC transfused; complete white out of left hemithorax (again). Underwent bronch w/ cryoprobe for removal of clots. large clot in the left lower lobe as well as friable mucosa throughout the left tracheobronchial tree. Low grade temp. Concern for possible VAP/HCAP so sputum sent and Cefepime started. 7/27: Trach placement 7/29 tolerating PSV  7/30 ATC alt w/ nocturnal vent 8/2 increased WOB and has required more frequent vent support in PRVC mode 8/12 -device delivery, plan for 48 hours of family training, once independent and competent with device will likely discharge  home  Subjective: No acute issues or events overnight continues to tolerate trach collar with increasing success  -patient clearing speech better, advancing diet as tolerated.  Plan for disposition home in the next few days pending device tolerance and family is comfort level with being more independent with patient's care.  Objective: Vitals:   02/12/21 0600 02/12/21 0731  BP: 128/73   Pulse: 66   Resp: (!) 26   Temp:  98.4 F (36.9 C)  SpO2: 92%     Intake/Output Summary (Last 24 hours) at 02/12/2021 0739 Last data filed at 02/12/2021 0524 Gross per 24 hour  Intake 1117 ml  Output 1850 ml  Net -733 ml    Filed Weights   02/10/21 0343 02/11/21 0406 02/12/21 0412  Weight: 90.4 kg 88.1 kg 89.4 kg    Exam:  Constitutional: NAD, pleasant affect, denies nausea or abd pain Respiratory: Midline tracheostomy  #8.0 cuffed;  PRVC mode FiO2 30 %, rate 22, VT 510, PEEP 5 HS (did not use overnight into 8/10) and TC 35% FIO2 during the day; Lungs coarse but clear, no increased WOB, strong cough Cardiovascular: S1S2, AF w/ VR 80-90, no peripheral edema Abdomen: PEG for bolus feed-abd soft and NTTP, normoactive BS+ Neurologic: CN 2-12 grossly intact. Sensation intact,  Strength 3/5 x all 4 extremities.  Psychiatric: A and O X 3, interactive   Assessment/Plan: Acute problems:  Acute on chronic hypoxemic and hypercarbic respiratory failure: Multifactorial including restrictive lung disease due to kyphoscoliosis, OHS/OSA, tracheobronchitis leading to hemoptysis. ILD/Pulmonary fibrosis - Per PCCM -Significantly improved noting has been able to tolerate trach collar since 7 AM on 8/9 as of the a.m. of 8/10 she remains on trach collar -Patient scarring likely postinfectious or inflammatory scarring or recurrent chronic aspiration and profound pneumonia -Discussed with PCCM, will restart amiodarone given need for rate control -given improving respiratory status while on amiodarone it makes amiodarone toxicity less likely -Recommend high-resolution CT of the chest per PCCM likely in the next few months once  acute infectious processes have all resolved and stabilized. -PCCM d/w myself and dtr that pt will have days where when she is napping she will require PRVC support -8/10 discussed with Dr. Carlis Abbott.  She states patient is not stable enough from a respiratory standpoint to transfer out of the ICU just yet.   Acute on chronic combined CHF LV systolic dysfunction EF 45 to 50% Moderate RV systolic dysfunction Severe pulmonary hypertension 60 mmHg Mild to moderate MR and moderate TR:  -Cont BB; not on ARB/ACE inhibitor due to borderline hypotension - continue Lasix per tube 40 mg daily -continue strict I's/O -Daily weights as tolerated  Diarrhea with abdominal pain, resolving -Likely from TFs-recent XR w/o retained stool, no fever or leukocytosis -Add fiber for bulking -Imodium prn -Abd pain resolved  Permanent atrial fibrillation -Continue metoprolol- dose increased to 25 mg on 8/10, DC amiodarone -Potassium needs to be at least 4.0 therefore we will check electrolyte panel now and daily.  Potassium was 4.4 -Resume amiodarone for rate control, unlikely culprit of respiratory symptoms given improvement in respiratory status while continuing amiodarone over the recent past.  Repeat high-resolution CT scan in the future as per PCCM -Eliquis resumed 8/9  Diabetes mellitus 2 on long-term insulin; controlled -A1c 7.6% -Continue Semglee to 12 units daily -Continue SSI  Severe physical deconditioning: -Continue PT OT, plan for disposition home in the next 48 to 72 hours  Dysphagia s/p PEG: - Continue bolus tube feeds, free water. -Continue  to advance diet as tolerated -currently dysphagia 1 with thin liquids   History of CVA - Continue statin   Dementia: - 8/10 resume home Aricept   Seizure disorder, depression - Continue sertraline, keppra -Has history of TD thus rationale for Cymbalta prior to admission-resumed 8/3   Stage IIIa CKD:  -Currently better than that diagnosis would  suggest. - Avoid nephrotoxins -Follow creatinine closely with IV diuresis -current eGFR >60   Acute hypokalemia: - Resolved   Anemia of chronic disease exacerbated by acute blood loss anemia due to hemoptysis (improving):  -Hgb stable at 8.8-possibly a degree of hemodilution from acute heart failure - Monitor intermittently-no further hemoptysis noted   Hypothyroidism:  -TSH 0.697 - Continue synthroid  Data Reviewed: Basic Metabolic Panel: Recent Labs  Lab 02/08/21 0434 02/09/21 1246 02/10/21 0156 02/11/21 0301 02/12/21 0459  NA 136 138 136 135 137  K 4.3 3.9 4.4 4.5 4.1  CL 105 102 103 101 100  CO2 _0 GLUCOSE 154* 227* 167* 202* 149*  BUN 29* 25* 27* 25* 24*  CREATININE 0.81 0.92 0.82 0.79 0.78  CALCIUM 8.4* 9.2 8.9 8.8* 9.1   CBC: Recent Labs  Lab 02/09/21 1246 02/10/21 0156 02/11/21 0301 02/12/21 0459  WBC 8.0 9.4 7.1 8.0  NEUTROABS 5.2 5.9 4.1 4.5  HGB 9.2* 9.4* 8.9* 9.0*  HCT 30.1* 31.7* 28.7* 29.1*  MCV 95.3 96.1 94.1 93.6  PLT 245 221 190 225    BNP (last 3 results) Recent Labs    11/21/20 1656 12/21/20 0953 01/03/21 1527  BNP 425.0* 453.0* 614.0*    CBG: Recent Labs  Lab 02/11/21 1533 02/11/21 1935 02/11/21 2325 02/12/21 0329 02/12/21 0727  GLUCAP 176* 246* 176* 143* 132*     Recent Results (from the past 240 hour(s))  MRSA Next Gen by PCR, Nasal     Status: None   Collection Time: 02/07/21 12:16 PM   Specimen: Nasal Mucosa; Nasal Swab  Result Value Ref Range Status   MRSA by PCR Next Gen NOT DETECTED NOT DETECTED Final    Comment: (NOTE) The GeneXpert MRSA Assay (FDA approved for NASAL specimens only), is one component of a comprehensive MRSA colonization surveillance program. It is not intended to diagnose MRSA infection nor to guide or monitor treatment for MRSA infections. Test performance is not FDA approved in patients less than 32 years old. Performed at [facility], [place] [address] [place],  [place] [address]       Scheduled Meds:  apixaban  5 mg Per Tube BID   atorvastatin  80 mg Per Tube Daily   chlorhexidine gluconate (MEDLINE KIT)  15 mL Mouth Rinse BID   Chlorhexidine Gluconate Cloth  6 each Topical Daily   donepezil  5 mg Per Tube QHS   DULoxetine  30 mg Oral BID   feeding supplement  237 mL Oral BID BM   feeding supplement (KATE FARMS STANDARD 1.4)  325 mL Per Tube QID   feeding supplement (PROSource TF)  45 mL Per Tube TID   fiber  1 packet Per Tube BID   free water  100 mL Per Tube Q6H   furosemide  40 mg Per Tube Daily   gabapentin  100 mg Per Tube Q8H   Gerhardt's butt cream  1 application Topical BID   insulin aspart  0-15 Units Subcutaneous Q4H   insulin glargine-yfgn  12 Units Subcutaneous Daily   levETIRAcetam  1,000 mg Per Tube BID  levothyroxine  175 mcg Per Tube Q0600   mouth rinse  15 mL Mouth Rinse 10 times per day   metoprolol tartrate  25 mg Per Tube BID   pantoprazole sodium  40 mg Per Tube Daily   potassium chloride  20 mEq Per Tube Daily   sertraline  25 mg Per Tube Daily     Active Problems:   Acute respiratory failure with hypoxia and hypercapnia (HCC)   Acute on chronic respiratory failure with hypoxia and hypercapnia (HCC)   Hypercapnic respiratory failure (HCC)   Status post tracheostomy (HCC)   Encephalopathy acute   Hemoptysis   Tracheostomy dependent (HCC)   Physical deconditioning   Insulin dependent type 2 diabetes mellitus, controlled ([place])   Ventilator dependence ([place])   Consultants: PCCM Interventional radiology  Procedures: Echocardiogram Tracheostomy tube PEG tube placement Video-assisted bronchoscopy  Antibiotics: Cefepime 7/3 through 7/8 Azithromycin 7/3 through 7/7 Vancomycin 7/3, 7/4 Cefazolin 7/11 x 1 dose Cefepime 7/27 through 7/31  Time spent: 25 minutes  [place] [facility]  Pager: Secure chat 7p - 7a -please see Amion  40  days

## 2021-02-13 LAB — GLUCOSE, CAPILLARY
Glucose-Capillary: 128 mg/dL — ABNORMAL HIGH (ref 70–99)
Glucose-Capillary: 135 mg/dL — ABNORMAL HIGH (ref 70–99)
Glucose-Capillary: 174 mg/dL — ABNORMAL HIGH (ref 70–99)
Glucose-Capillary: 182 mg/dL — ABNORMAL HIGH (ref 70–99)
Glucose-Capillary: 184 mg/dL — ABNORMAL HIGH (ref 70–99)
Glucose-Capillary: 188 mg/dL — ABNORMAL HIGH (ref 70–99)

## 2021-02-13 LAB — BASIC METABOLIC PANEL
Anion gap: 9 (ref 5–15)
BUN: 26 mg/dL — ABNORMAL HIGH (ref 8–23)
CO2: 24 mmol/L (ref 22–32)
Calcium: 8.7 mg/dL — ABNORMAL LOW (ref 8.9–10.3)
Chloride: 101 mmol/L (ref 98–111)
Creatinine, Ser: 0.85 mg/dL (ref 0.44–1.00)
GFR, Estimated: >60 mL/min (ref >60)
Glucose, Bld: 146 mg/dL — ABNORMAL HIGH (ref 70–99)
Potassium: 3.9 mmol/L (ref 3.5–5.1)
Sodium: 134 mmol/L — ABNORMAL LOW (ref 135–145)

## 2021-02-13 LAB — CBC WITH DIFFERENTIAL/PLATELET
Abs Immature Granulocytes: 0.06 10*3/uL (ref 0.00–0.07)
Basophils Absolute: 0.1 10*3/uL (ref 0.0–0.1)
Basophils Relative: 1 %
Eosinophils Absolute: 0.1 10*3/uL (ref 0.0–0.5)
Eosinophils Relative: 1 %
HCT: 28.8 % — ABNORMAL LOW (ref 36.0–46.0)
Hemoglobin: 9.3 g/dL — ABNORMAL LOW (ref 12.0–15.0)
Immature Granulocytes: 1 %
Lymphocytes Relative: 31 %
Lymphs Abs: 2.6 10*3/uL (ref 0.7–4.0)
MCH: 29.7 pg (ref 26.0–34.0)
MCHC: 32.3 g/dL (ref 30.0–36.0)
MCV: 92 fL (ref 80.0–100.0)
Monocytes Absolute: 0.7 10*3/uL (ref 0.1–1.0)
Monocytes Relative: 9 %
Neutro Abs: 4.7 10*3/uL (ref 1.7–7.7)
Neutrophils Relative %: 57 %
Platelets: 215 10*3/uL (ref 150–400)
RBC: 3.13 MIL/uL — ABNORMAL LOW (ref 3.87–5.11)
RDW: 18.3 % — ABNORMAL HIGH (ref 11.5–15.5)
WBC: 8.1 10*3/uL (ref 4.0–10.5)
nRBC: 0 % (ref 0.0–0.2)

## 2021-02-13 MED ORDER — MELATONIN 3 MG PO TABS
3.0000 mg | ORAL_TABLET | Freq: Every evening | ORAL | Status: DC | PRN
  Administered 2021-02-13: 3 mg via ORAL
  Filled 2021-02-13: qty 1

## 2021-02-13 NOTE — Progress Notes (Signed)
RT note-Patient has been removed from home ventilator, family states she had a good night. Placed on 40% ATC. Continue to monitor.

## 2021-02-13 NOTE — Progress Notes (Signed)
Daughter at bedside placed pt on trilogy at this time.

## 2021-02-13 NOTE — Progress Notes (Signed)
  Speech Language Pathology Treatment:    Patient Details Name: [name] MRN: [id number] DOB: January 22, 1951 Today's Date: 02/13/2021 Time: [id number] SLP Time Calculation (min) (ACUTE ONLY): 14 min  Assessment / Plan / Recommendation Clinical Impression  SWALLOWING Pt seen for ongoing dysphagia management.  PMV and upper dentures placed prior to administration of PO trials.  Pt tolerated thin liquid by cup and straw, puree, and regular solids with no clinical s/s of aspiration.  Pt exhibited excellent oral clearance of regular solids with dentures in place.  Pt noted to take large bit, but took her time with PO trials.  Daughter provided verbal cue x1 to take smaller bites.  PMV Pt tolerated PMV during 14 minute session with SLP.  Pt had excellent phonation with valve in place.  SpO2 at 100%.  Daughter was able to verbalize reasons to remove PMV and cuff deflation precautions independently.     HPI HPI: Pt is a 70 y/o female presented to [facility] on 7/03 with dyspnea and altered mental status.  Transferred to [facility]; failed BiPAP and required intubation. 7/7 trach; 7/11 PEG. Dx acute on chronic hypoxic/hypercapnic respiratory failure from acute pulmonary edema, pneumonia; severe deconditioning; metabolic encephalopathy.  Had admission from 12/21/20 to 12/30/20 for respiratory failure and CHF exacerbation; was on dysphagia 3 and thin liquids on 6/27. PMHx: Kyphoscoliosis, Restrictive lung disease, Chronic respiratory failure on 2 liters, DM type 2, Chronic a fib on eliquis, Dementia, Chronic combined CHF, Hypothyroidism, Seizures, CKD 3a, CAD s/p NSTEMI, HLD, GERD.      SLP Plan  Continue with current plan of care       Recommendations  Diet recommendations: Regular;Thin liquid Liquids provided via: Cup;Straw Medication Administration: Via alternative means (May also be administered with puree (crushed as needed) as tolerated) Supervision: Full supervision/cueing for compensatory  strategies Compensations: Slow rate Postural Changes and/or Swallow Maneuvers: Seated upright 90 degrees      Patient may use Passy-Muir Speech Valve: During all therapies with supervision PMSV Supervision: Full         Oral Care Recommendations: Oral care BID Follow up Recommendations: Home health SLP SLP Visit Diagnosis: Dysphagia, oropharyngeal phase (R13.12) Plan: Continue with current plan of care       GO                Kerrie Pleasure, MA, CCC-SLP Acute Rehabilitation Services Office: [phone]; Pager (8/13): [phone] 02/13/2021, 11:18 AM

## 2021-02-13 NOTE — Progress Notes (Addendum)
[facility] PROGRESS NOTE  [name]:[id number] DOB: 03/29/1951 DOA: 01/03/2021 PCP: Lemmie Evens, MD   Status: Remains inpatient appropriate because: -Unsafe disposition, pending family training and competence with new devices - transition home in the next few days to week is certainly reasonable.  Dispo: The patient is from: Home              Anticipated d/c is to: Home pending family training and competence with new equipment              Patient currently is not medically stable to d/c.   Difficult to place patient No  Level of care: ICU 2/2 friable pulmonary mucosa/new trach/requirement for PRN and nocturnal ventilator  Code Status: Full Family Communication: Daughter at bedside on 8/2 DVT prophylaxis: Subcu heparin COVID vaccination status: Not documented  HPI: 70 yo female who presented to [facility] on 7/03 with dyspnea, and altered mental status. Had admission from 12/21/20 to 12/30/20 for respiratory failure and CHF exacerbation.Transferred to [facility]. SpO2 65% and elevated PCO2.  Failed Bipap and required intubation. Course since then complicated by prolonged mechanical ventilation requiring tracheostomy 7/7. She has been managed by PCCM for 4 weeks, deemed to require home vent which is delayed by need for generator at home. This is being arranged. She was transferred to hospitalist service on 02/01/2021.   Significant events: 7/3  chronic HCRF from OHS and restrictive lung dz as well as deconditioning-->progression -->decompensated HFpEF and cor pulmonale-->worsening metabolic enceph from HCRF-->PNA --> acute on chronic resp failure. Admitted. Intubated. Ceftriaxone 7/3 x 1. Azith and Cefepime started. 7/4-7/5 Hypotension overnight. A line placed. 526m LR bolus. [place] 7[place]6 Hgb 6.7 (7.4), CT A/P completed to r/o RP bleed (negative), showed mild peripancreatic stranding. Lipase slightly elevated. 1U PRBCs ordered. 7/7 Tracheostomy placed, heparin gtt  held for procedure. 7/8 Increased agitation with Propofol wean, increased HR, uptrending pressor requirements. Cortrak placed. Azith and cefepime stopped. 7/11 PEG tube placed, tolerating PSV 7/12 ATC trials started. Tubefeeds started/resumed 7/13 solucortef decreased to daily. Midodrine stopped. Heparin gtt stopped. Stated back on DOAC. PANDA removed 7/14: sat at side of bed w/ max assist for 15 minutes. Fatigued quickly. PT recommending LTAC setting 7/15: Tolerated trach collar yesterday 7/16: TCT x 2 hours 7/18 hgb 6.8 received 1 unit PRBC. LTACH appeal denied 7/22 Tolerating brief periods of trach collar and PMV, Periods of emesis >> TFs held, Bright red blood noted by RT 7/23 left atelectasis, bronchoscopy showing inspissated clot occluding distal trachea, needed cryo probe to remove clot down to left mainstem 7/20 4 repeat bronchoscopy with cryoprobe to remove residual clot left upper lobe and lower lobe, oozing of underlying mucosa, distal clot remains 7/23 atx due to clot. Trach replaced with ETT for purpose of bronchoscopy. Inspissated clot suctioned. No obvious proximal bleeding source identified. Required TXA nebs and Cryoprobe 7/25 still w/ bloody trach secretions. Tolerating CPAP 7/26 hgb down 6.9 1 u PRBC transfused; complete white out of left hemithorax (again). Underwent bronch w/ cryoprobe for removal of clots. large clot in the left lower lobe as well as friable mucosa throughout the left tracheobronchial tree. Low grade temp. Concern for possible VAP/HCAP so sputum sent and Cefepime started. 7/27: Trach placement 7/29 tolerating PSV  7/30 ATC alt w/ nocturnal vent 8/2 increased WOB and has required more frequent vent support in PRVC mode 8/12 -device delivery, plan for 48 hours of family training, once independent and competent with device will likely discharge  home 8/13 -no issues overnight, patient and family adjusting well to being more independent and  care  Subjective: No acute issues or events overnight, review of systems negative, patient overtly excited about possible discharge in the next few days as his family at bedside  Objective: Vitals:   02/13/21 0543 02/13/21 0721  BP: 107/89   Pulse: 77   Resp:    Temp:    SpO2: 95% 92%    Intake/Output Summary (Last 24 hours) at 02/13/2021 0728 Last data filed at 02/13/2021 0600 Gross per 24 hour  Intake 1194 ml  Output 1850 ml  Net -656 ml    Filed Weights   02/11/21 0406 02/12/21 0412 02/13/21 0443  Weight: 88.1 kg 89.4 kg 87.4 kg    Exam:  Constitutional: NAD, pleasant affect, denies nausea or abd pain Respiratory: Midline tracheostomy  #8.0 cuffed;  PRVC mode FiO2 30 %, rate 22, VT 510, PEEP 5 HS (did not use overnight into 8/10) and TC 35% FIO2 during the day; Lungs coarse but clear, no increased WOB, strong cough Cardiovascular: S1S2, AF w/ VR 80-90, no peripheral edema Abdomen: PEG for bolus feed-abd soft and NTTP, normoactive BS+ Neurologic: CN 2-12 grossly intact. Sensation intact,  Strength 3/5 x all 4 extremities.  Psychiatric: A and O X 3, interactive   Assessment/Plan: Acute problems:  Acute on chronic hypoxemic and hypercarbic respiratory failure: Multifactorial including restrictive lung disease due to kyphoscoliosis, OHS/OSA, tracheobronchitis leading to hemoptysis. ILD/Pulmonary fibrosis - Per PCCM -Significantly improved noting has been able to tolerate trach collar since 7 AM on 8/9 as of the a.m. of 8/10 she remains on trach collar -Patient scarring likely postinfectious or inflammatory scarring or recurrent chronic aspiration and profound pneumonia -Discussed with PCCM, will restart amiodarone given need for rate control -given improving respiratory status while on amiodarone it makes amiodarone toxicity less likely -Recommend high-resolution CT of the chest per PCCM likely in the next few months once acute infectious processes have all resolved and  stabilized. -PCCM d/w myself and dtr that pt will have days where when she is napping she will require PRVC support   Acute on chronic combined CHF LV systolic dysfunction EF 45 to 50% Moderate RV systolic dysfunction Severe pulmonary hypertension 60 mmHg Mild to moderate MR and moderate TR:  -Cont BB; not on ARB/ACE inhibitor due to borderline hypotension - continue Lasix per tube 40 mg daily -continue strict I's/O -Daily weights as tolerated  Diarrhea with abdominal pain, resolving -Likely from TFs-recent XR w/o retained stool, no fever or leukocytosis -Add fiber for bulking -Imodium prn -Abd pain resolved  Permanent atrial fibrillation -Continue metoprolol- dose increased to 25 mg on 8/10, DC amiodarone -Potassium needs to be at least 4.0 therefore we will check electrolyte panel now and daily.  Potassium was 4.4 -Resume amiodarone for rate control, unlikely culprit of respiratory symptoms given improvement in respiratory status while continuing amiodarone over the recent past.  Repeat high-resolution CT scan in the future as per PCCM -Eliquis resumed 8/9  Diabetes mellitus 2 on long-term insulin; controlled -A1c 7.6% -Continue Semglee to 12 units daily -Continue SSI  Severe physical deconditioning: -Continue PT OT, plan for disposition home in the next 48 to 72 hours  Dysphagia s/p PEG: - Continue bolus tube feeds, free water. -Continue to advance diet as tolerated -currently dysphagia 1 with thin liquids   History of CVA - Continue statin   Dementia: - 8/10 resume home Aricept   Seizure disorder, depression - Continue sertraline,  keppra -Has history of TD thus rationale for Cymbalta prior to admission-resumed 8/3   Stage IIIa CKD:  -Currently better than that diagnosis would suggest. - Avoid nephrotoxins -Follow creatinine closely with IV diuresis -current eGFR >60   Acute hypokalemia: - Resolved   Anemia of chronic disease exacerbated by acute blood loss  anemia due to hemoptysis (improving):  -Hgb stable at 8.8-possibly a degree of hemodilution from acute heart failure - Monitor intermittently-no further hemoptysis noted   Hypothyroidism:  -TSH 0.697 - Continue synthroid  Data Reviewed: Basic Metabolic Panel: Recent Labs  Lab 02/09/21 1246 02/10/21 0156 02/11/21 0301 02/12/21 0459 02/13/21 0149  NA 138 136 135 137 134*  K 3.9 4.4 4.5 4.1 3.9  CL 102 103 101 100 101  CO2 _0 GLUCOSE 227* 167* 202* 149* 146*  BUN 25* 27* 25* 24* 26*  CREATININE 0.92 0.82 0.79 0.78 0.85  CALCIUM 9.2 8.9 8.8* 9.1 8.7*   CBC: Recent Labs  Lab 02/09/21 1246 02/10/21 0156 02/11/21 0301 02/12/21 0459 02/13/21 0149  WBC 8.0 9.4 7.1 8.0 8.1  NEUTROABS 5.2 5.9 4.1 4.5 4.7  HGB 9.2* 9.4* 8.9* 9.0* 9.3*  HCT 30.1* 31.7* 28.7* 29.1* 28.8*  MCV 95.3 96.1 94.1 93.6 92.0  PLT 245 221 190 225 215    BNP (last 3 results) Recent Labs    11/21/20 1656 12/21/20 0953 01/03/21 1527  BNP 425.0* 453.0* 614.0*    CBG: Recent Labs  Lab 02/12/21 1116 02/12/21 1605 02/12/21 1933 02/12/21 2326 02/13/21 0328  GLUCAP 195* 246* 219* 127* 135*     Recent Results (from the past 240 hour(s))  MRSA Next Gen by PCR, Nasal     Status: None   Collection Time: 02/07/21 12:16 PM   Specimen: Nasal Mucosa; Nasal Swab  Result Value Ref Range Status   MRSA by PCR Next Gen NOT DETECTED NOT DETECTED Final    Comment: (NOTE) The GeneXpert MRSA Assay (FDA approved for NASAL specimens only), is one component of a comprehensive MRSA colonization surveillance program. It is not intended to diagnose MRSA infection nor to guide or monitor treatment for MRSA infections. Test performance is not FDA approved in patients less than 72 years old. Performed at [facility], [place] [address], [place], [place] [address]       Scheduled Meds:  amiodarone  200 mg Oral q morning   apixaban  5 mg Per Tube BID   atorvastatin  80 mg Per Tube Daily    chlorhexidine gluconate (MEDLINE KIT)  15 mL Mouth Rinse BID   Chlorhexidine Gluconate Cloth  6 each Topical Daily   donepezil  5 mg Per Tube QHS   DULoxetine  30 mg Oral BID   feeding supplement  237 mL Oral BID BM   feeding supplement (KATE [place]ARMS STANDARD 1.4)  325 mL Per Tube QID   feeding supplement (PROSource TF)  45 mL Per Tube TID   fiber  1 packet Per Tube BID   free water  100 mL Per Tube Q6H   furosemide  40 mg Per Tube Daily   gabapentin  100 mg Per Tube Q8H   Gerhardt's butt cream  1 application Topical BID   insulin aspart  0-15 Units Subcutaneous Q4H   insulin glargine-yfgn  12 Units Subcutaneous Daily   levETIRAcetam  1,000 mg Per Tube BID   levothyroxine  175 mcg Per Tube Q0600   mouth rinse  15 mL Mouth Rinse 10 times  per day   metoprolol tartrate  25 mg Per Tube BID   pantoprazole sodium  40 mg Per Tube Daily   potassium chloride  20 mEq Per Tube Daily   sertraline  25 mg Per Tube Daily     Active Problems:   Acute respiratory failure with hypoxia and hypercapnia (HCC)   Acute on chronic respiratory failure with hypoxia and hypercapnia (HCC)   Hypercapnic respiratory failure (HCC)   Status post tracheostomy ([place])   Encephalopathy acute   Hemoptysis   Tracheostomy dependent (HCC)   Physical deconditioning   Insulin dependent type 2 diabetes mellitus, controlled ([place])   Ventilator dependence ([place])   Consultants: PCCM Interventional radiology  Procedures: Echocardiogram Tracheostomy tube PEG tube placement Video-assisted bronchoscopy  Antibiotics: Cefepime 7/3 through 7/8 Azithromycin 7/3 through 7/7 Vancomycin 7/3, 7/4 Cefazolin 7/11 x 1 dose Cefepime 7/27 through 7/31  Time spent: 25 minutes  [facility]  Pager: Secure chat 7p - 7a -please see Amion  41  days

## 2021-02-14 LAB — CBC WITH DIFFERENTIAL/PLATELET
Abs Immature Granulocytes: 0.05 10*3/uL (ref 0.00–0.07)
Basophils Absolute: 0.1 10*3/uL (ref 0.0–0.1)
Basophils Relative: 1 %
Eosinophils Absolute: 0.1 10*3/uL (ref 0.0–0.5)
Eosinophils Relative: 1 %
HCT: 29.2 % — ABNORMAL LOW (ref 36.0–46.0)
Hemoglobin: 9.4 g/dL — ABNORMAL LOW (ref 12.0–15.0)
Immature Granulocytes: 1 %
Lymphocytes Relative: 31 %
Lymphs Abs: 2.3 10*3/uL (ref 0.7–4.0)
MCH: 29.4 pg (ref 26.0–34.0)
MCHC: 32.2 g/dL (ref 30.0–36.0)
MCV: 91.3 fL (ref 80.0–100.0)
Monocytes Absolute: 0.8 10*3/uL (ref 0.1–1.0)
Monocytes Relative: 10 %
Neutro Abs: 4.3 10*3/uL (ref 1.7–7.7)
Neutrophils Relative %: 56 %
Platelets: 235 10*3/uL (ref 150–400)
RBC: 3.2 MIL/uL — ABNORMAL LOW (ref 3.87–5.11)
RDW: 18.5 % — ABNORMAL HIGH (ref 11.5–15.5)
WBC: 7.5 10*3/uL (ref 4.0–10.5)
nRBC: 0 % (ref 0.0–0.2)

## 2021-02-14 LAB — BASIC METABOLIC PANEL
Anion gap: 9 (ref 5–15)
BUN: 26 mg/dL — ABNORMAL HIGH (ref 8–23)
CO2: 23 mmol/L (ref 22–32)
Calcium: 8.8 mg/dL — ABNORMAL LOW (ref 8.9–10.3)
Chloride: 103 mmol/L (ref 98–111)
Creatinine, Ser: 0.93 mg/dL (ref 0.44–1.00)
GFR, Estimated: >60 mL/min (ref >60)
Glucose, Bld: 181 mg/dL — ABNORMAL HIGH (ref 70–99)
Potassium: 3.6 mmol/L (ref 3.5–5.1)
Sodium: 135 mmol/L (ref 135–145)

## 2021-02-14 LAB — GLUCOSE, CAPILLARY
Glucose-Capillary: 132 mg/dL — ABNORMAL HIGH (ref 70–99)
Glucose-Capillary: 160 mg/dL — ABNORMAL HIGH (ref 70–99)
Glucose-Capillary: 166 mg/dL — ABNORMAL HIGH (ref 70–99)
Glucose-Capillary: 173 mg/dL — ABNORMAL HIGH (ref 70–99)
Glucose-Capillary: 202 mg/dL — ABNORMAL HIGH (ref 70–99)
Glucose-Capillary: 202 mg/dL — ABNORMAL HIGH (ref 70–99)

## 2021-02-14 MED ORDER — MELATONIN 3 MG PO TABS
3.0000 mg | ORAL_TABLET | Freq: Every evening | ORAL | Status: DC | PRN
  Administered 2021-02-14: 3 mg
  Filled 2021-02-14: qty 1

## 2021-02-14 NOTE — Progress Notes (Signed)
[facility] PROGRESS NOTE  [name] UQJ:[id number] DOB: 1950-11-25 DOA: 01/03/2021 PCP: Lemmie Evens, MD   Status: Remains inpatient appropriate because: -Unsafe disposition, pending family training and competence with new devices - transition home in the next few days to week is certainly reasonable. Family continues to exceed expectations in training and self-sufficiency.  Family and patient indicate she has everything she needs for discharge in the next 24 to 48 hours, tolerating p.o. well over the past 48 hours otherwise stable for discharge.  Dispo: The patient is from: Home              Anticipated d/c is to: Home pending family training and competence with new equipment              Patient currently is not medically stable to d/c.   Difficult to place patient No  Level of care: ICU 2/2 friable pulmonary mucosa/new trach/requirement for PRN and nocturnal ventilator  Code Status: Full Family Communication: Daughter at bedside on 8/2 DVT prophylaxis: Subcu heparin COVID vaccination status: Not documented  HPI: 70 yo female who presented to [facility] on 7/03 with dyspnea, and altered mental status. Had admission from 12/21/20 to 12/30/20 for respiratory failure and CHF exacerbation.Transferred to [facility]. SpO2 65% and elevated PCO2.  Failed Bipap and required intubation. Course since then complicated by prolonged mechanical ventilation requiring tracheostomy 7/7. She has been managed by PCCM for 4 weeks, deemed to require home vent which is delayed by need for generator at home. This is being arranged. She was transferred to hospitalist service on 02/01/2021.   Significant events: 7/3  chronic HCRF from OHS and restrictive lung dz as well as deconditioning-->progression -->decompensated HFpEF and cor pulmonale-->worsening metabolic enceph from HCRF-->PNA --> acute on chronic resp failure. Admitted. Intubated. Ceftriaxone 7/3 x 1. Azith and Cefepime started. 7/4-7/5 Hypotension overnight.  A line placed. 566m LR bolus. Started on NLavacaplaced 7/6 Hgb 6.7 (7.4), CT A/P completed to r/o RP bleed (negative), showed mild peripancreatic stranding. Lipase slightly elevated. 1U PRBCs ordered. 7/7 Tracheostomy placed, heparin gtt held for procedure. 7/8 Increased agitation with Propofol wean, increased HR, uptrending pressor requirements. Cortrak placed. Azith and cefepime stopped. 7/11 PEG tube placed, tolerating PSV 7/12 ATC trials started. Tubefeeds started/resumed 7/13 solucortef decreased to daily. Midodrine stopped. Heparin gtt stopped. Stated back on DOAC. PANDA removed 7/14: sat at side of bed w/ max assist for 15 minutes. Fatigued quickly. PT recommending LTAC setting 7/15: Tolerated trach collar yesterday 7/16: TCT x 2 hours 7/18 hgb 6.8 received 1 unit PRBC. LTACH appeal denied 7/22 Tolerating brief periods of trach collar and PMV, Periods of emesis >> TFs held, Bright red blood noted by RT 7/23 left atelectasis, bronchoscopy showing inspissated clot occluding distal trachea, needed cryo probe to remove clot down to left mainstem 7/20 4 repeat bronchoscopy with cryoprobe to remove residual clot left upper lobe and lower lobe, oozing of underlying mucosa, distal clot remains 7/23 atx due to clot. Trach replaced with ETT for purpose of bronchoscopy. Inspissated clot suctioned. No obvious proximal bleeding source identified. Required TXA nebs and Cryoprobe 7/25 still w/ bloody trach secretions. Tolerating CPAP 7/26 hgb down 6.9 1 u PRBC transfused; complete white out of left hemithorax (again). Underwent bronch w/ cryoprobe for removal of clots. large clot in the left lower lobe as well as friable mucosa throughout the left tracheobronchial tree. Low grade temp. Concern for possible VAP/HCAP so sputum sent and Cefepime started. 7/27: Trach placement 7/29 tolerating  PSV  7/30 ATC alt w/ nocturnal vent 8/2 increased WOB and has required more frequent vent support in PRVC  mode 8/12 -device delivery, plan for 48 hours of family training, once independent and competent with device will likely discharge home 8/13 -no issues overnight, patient and family adjusting well to being more independent and care 8/14 - Family essentially independent at this point for patient care - continue training/awaiting safe disposition home in the next 24-48h  Subjective: No acute issues or events overnight, review of systems negative, patient overtly excited about possible discharge in the next few days as his family at bedside  Objective: Vitals:   02/14/21 0345 02/14/21 0403  BP:  111/67  Pulse:  62  Resp:  20  Temp: 98.4 F (36.9 C)   SpO2:  99%    Intake/Output Summary (Last 24 hours) at 02/14/2021 0720 Last data filed at 02/14/2021 0600 Gross per 24 hour  Intake 940 ml  Output 1150 ml  Net -210 ml    Filed Weights   02/12/21 0412 02/13/21 0443 02/14/21 0406  Weight: 89.4 kg 87.4 kg 86.4 kg    Exam:  Constitutional: NAD, pleasant affect, denies nausea or abd pain Respiratory: Midline tracheostomy  #8.0 cuffed;  PRVC mode FiO2 30 %, rate 22, VT 510, PEEP 5 HS (did not use overnight into 8/10) and TC 35% FIO2 during the day; Lungs coarse but clear, no increased WOB, strong cough Cardiovascular: S1S2, AF w/ VR 80-90, no peripheral edema Abdomen: PEG for bolus feed-abd soft and NTTP, normoactive BS+ Neurologic: CN 2-12 grossly intact. Sensation intact,  Strength 3/5 x all 4 extremities.  Psychiatric: A and O X 3, interactive   Assessment/Plan: Acute problems:  Acute on chronic hypoxemic and hypercarbic respiratory failure: Multifactorial including restrictive lung disease due to kyphoscoliosis, OHS/OSA, tracheobronchitis leading to hemoptysis. ILD/Pulmonary fibrosis - Per PCCM -Significantly improved noting has been able to tolerate trach collar since 7 AM on 8/9 as of the a.m. of 8/10 she remains on trach collar -Patient scarring likely postinfectious or  inflammatory scarring or recurrent chronic aspiration and profound pneumonia -Discussed with PCCM, will restart amiodarone given need for rate control -given improving respiratory status while on amiodarone it makes amiodarone toxicity less likely -Recommend high-resolution CT of the chest per PCCM likely in the next few months once acute infectious processes have all resolved and stabilized. -PCCM d/w myself and dtr that pt will have days where when she is napping she will require PRVC support   Acute on chronic combined CHF LV systolic dysfunction EF 45 to 50% Moderate RV systolic dysfunction Severe pulmonary hypertension 60 mmHg Mild to moderate MR and moderate TR:  -Cont BB; not on ARB/ACE inhibitor due to borderline hypotension - continue Lasix per tube 40 mg daily -continue strict I's/O -Daily weights as tolerated  Diarrhea with abdominal pain, resolving -Likely from TFs-recent XR w/o retained stool, no fever or leukocytosis -Add fiber for bulking -Imodium prn -Abd pain resolved  Permanent atrial fibrillation -Continue metoprolol- dose increased to 25 mg on 8/10, DC amiodarone -Potassium needs to be at least 4.0 therefore we will check electrolyte panel now and daily.  Potassium was 4.4 -Resume amiodarone for rate control, unlikely culprit of respiratory symptoms given improvement in respiratory status while continuing amiodarone over the recent past.  Repeat high-resolution CT scan in the future as per PCCM -Eliquis resumed 8/9  Diabetes mellitus 2 on long-term insulin; controlled -A1c 7.6% -Continue Semglee to 12 units daily -Continue SSI  Severe physical deconditioning: -Continue PT OT, plan for disposition home in the next 48 to 72 hours  Dysphagia s/p PEG: - Continue bolus tube feeds, free water. -Continue to advance diet as tolerated -currently dysphagia 1 with thin liquids   History of CVA - Continue statin   Dementia: - 8/10 resume home Aricept   Seizure  disorder, depression - Continue sertraline, keppra -Has history of TD thus rationale for Cymbalta prior to admission-resumed 8/3   Stage IIIa CKD:  -Currently better than that diagnosis would suggest. - Avoid nephrotoxins -Follow creatinine closely with IV diuresis -current eGFR >60   Acute hypokalemia: - Resolved   Anemia of chronic disease exacerbated by acute blood loss anemia due to hemoptysis (improving):  -Hgb stable at 8.8-possibly a degree of hemodilution from acute heart failure - Monitor intermittently-no further hemoptysis noted   Hypothyroidism:  -TSH 0.697 - Continue synthroid  Data Reviewed: Basic Metabolic Panel: Recent Labs  Lab 02/10/21 0156 02/11/21 0301 02/12/21 0459 02/13/21 0149 02/14/21 0141  NA 136 135 137 134* 135  K 4.4 4.5 4.1 3.9 3.6  CL 103 101 100 101 103  CO2 '26 27 27 24 23  ' GLUCOSE 167* 202* 149* 146* 181*  BUN 27* 25* 24* 26* 26*  CREATININE 0.82 0.79 0.78 0.85 0.93  CALCIUM 8.9 8.8* 9.1 8.7* 8.8*   CBC: Recent Labs  Lab 02/10/21 0156 02/11/21 0301 02/12/21 0459 02/13/21 0149 02/14/21 0141  WBC 9.4 7.1 8.0 8.1 7.5  NEUTROABS 5.9 4.1 4.5 4.7 4.3  HGB 9.4* 8.9* 9.0* 9.3* 9.4*  HCT 31.7* 28.7* 29.1* 28.8* 29.2*  MCV 96.1 94.1 93.6 92.0 91.3  PLT 221 190 225 215 235    BNP (last 3 results) Recent Labs    11/21/20 1656 12/21/20 0953 01/03/21 1527  BNP 425.0* 453.0* 614.0*    CBG: Recent Labs  Lab 02/13/21 1113 02/13/21 1543 02/13/21 1920 02/13/21 2345 02/14/21 0332  GLUCAP 188* 174* 184* 182* 166*     Recent Results (from the past 240 hour(s))  MRSA Next Gen by PCR, Nasal     Status: None   Collection Time: 02/07/21 12:16 PM   Specimen: Nasal Mucosa; Nasal Swab  Result Value Ref Range Status   MRSA by PCR Next Gen NOT DETECTED NOT DETECTED Final    Comment: (NOTE) The GeneXpert MRSA Assay (FDA approved for NASAL specimens only), is one component of a comprehensive MRSA colonization surveillance program. It  is not intended to diagnose MRSA infection nor to guide or monitor treatment for MRSA infections. Test performance is not FDA approved in patients less than 89 years old. Performed at [facility], [place] [address], [place], [place] [address]       Scheduled Meds:  amiodarone  200 mg Oral q morning   apixaban  5 mg Per Tube BID   atorvastatin  80 mg Per Tube Daily   chlorhexidine gluconate (MEDLINE KIT)  15 mL Mouth Rinse BID   Chlorhexidine Gluconate Cloth  6 each Topical Daily   donepezil  5 mg Per Tube QHS   DULoxetine  30 mg Oral BID   feeding supplement  237 mL Oral BID BM   feeding supplement (KATE FARMS STANDARD 1.4)  325 mL Per Tube QID   feeding supplement (PROSource TF)  45 mL Per Tube TID   fiber  1 packet Per Tube BID   free water  100 mL Per Tube Q6H   furosemide  40 mg Per Tube Daily  gabapentin  100 mg Per Tube Q8H   Gerhardt's butt cream  1 application Topical BID   insulin aspart  0-15 Units Subcutaneous Q4H   insulin glargine-yfgn  12 Units Subcutaneous Daily   levETIRAcetam  1,000 mg Per Tube BID   levothyroxine  175 mcg Per Tube Q0600   mouth rinse  15 mL Mouth Rinse 10 times per day   metoprolol tartrate  25 mg Per Tube BID   pantoprazole sodium  40 mg Per Tube Daily   potassium chloride  20 mEq Per Tube Daily   sertraline  25 mg Per Tube Daily     Active Problems:   Acute respiratory failure with hypoxia and hypercapnia (HCC)   Acute on chronic respiratory failure with hypoxia and hypercapnia (HCC)   Hypercapnic respiratory failure (HCC)   Status post tracheostomy ([place])   Encephalopathy acute   Hemoptysis   Tracheostomy dependent (HCC)   Physical deconditioning   Insulin dependent type 2 diabetes mellitus, controlled ([place])   Ventilator dependence ([place])   Consultants: PCCM Interventional radiology  Procedures: Echocardiogram Tracheostomy tube PEG tube placement Video-assisted bronchoscopy  Antibiotics: Cefepime 7/3 through  7/8 Azithromycin 7/3 through 7/7 Vancomycin 7/3, 7/4 Cefazolin 7/11 x 1 dose Cefepime 7/27 through 7/31  Time spent: 25 minutes  Little Ishikawa DO [facility]  Pager: Secure chat 7p - 7a -please see Amion  42  days

## 2021-02-15 LAB — GLUCOSE, CAPILLARY
Glucose-Capillary: 180 mg/dL — ABNORMAL HIGH (ref 70–99)
Glucose-Capillary: 137 mg/dL — ABNORMAL HIGH (ref 70–99)
Glucose-Capillary: 212 mg/dL — ABNORMAL HIGH (ref 70–99)
Glucose-Capillary: 159 mg/dL — ABNORMAL HIGH (ref 70–99)

## 2021-02-15 MED ORDER — SERTRALINE HCL 25 MG PO TABS: 25.0000 mg | ORAL_TABLET | Freq: Every day | ORAL | 3 refills | Status: DC

## 2021-02-15 MED ORDER — LEVOTHYROXINE SODIUM 175 MCG PO TABS: 175.0000 ug | ORAL_TABLET | Freq: Every day | ORAL | 3 refills | Status: DC

## 2021-02-15 MED ORDER — ATORVASTATIN CALCIUM 80 MG PO TABS: 80.0000 mg | ORAL_TABLET | Freq: Every day | ORAL | 3 refills | Status: DC

## 2021-02-15 MED ORDER — PANTOPRAZOLE SODIUM 40 MG PO TBEC: 40.0000 mg | DELAYED_RELEASE_TABLET | Freq: Two times a day (BID) | ORAL | 3 refills | Status: DC

## 2021-02-15 MED ORDER — IPRATROPIUM-ALBUTEROL 0.5-2.5 (3) MG/3ML IN SOLN: 3.0000 mL | RESPIRATORY_TRACT | 12 refills | Status: DC | PRN

## 2021-02-15 MED ORDER — METOPROLOL TARTRATE 25 MG PO TABS: 25.0000 mg | ORAL_TABLET | Freq: Two times a day (BID) | ORAL | 3 refills | Status: DC

## 2021-02-15 MED ORDER — AMIODARONE HCL 200 MG PO TABS: 200.0000 mg | ORAL_TABLET | Freq: Every day | ORAL | 9 refills | Status: DC

## 2021-02-15 MED ORDER — PROSOURCE TF PO LIQD: 45.0000 mL | Freq: Three times a day (TID) | ORAL | 12 refills | Status: DC

## 2021-02-15 MED ORDER — FREE WATER: 100.0000 mL | Freq: Four times a day (QID) | Status: DC

## 2021-02-15 MED ORDER — LOPERAMIDE HCL 1 MG/7.5ML PO SUSP: 2.0000 mg | ORAL | 0 refills | Status: DC | PRN

## 2021-02-15 MED ORDER — MECLIZINE HCL 12.5 MG PO TABS: 12.5000 mg | ORAL_TABLET | Freq: Three times a day (TID) | ORAL | 0 refills | Status: DC | PRN

## 2021-02-15 MED ORDER — KATE FARMS STANDARD 1.4 PO LIQD: 325.0000 mL | Freq: Four times a day (QID) | ORAL | 12 refills | Status: DC

## 2021-02-15 MED ORDER — ACETAMINOPHEN 500 MG PO TABS: 500.0000 mg | ORAL_TABLET | ORAL | 0 refills | Status: DC | PRN

## 2021-02-15 MED ORDER — MELATONIN 3 MG PO TABS: 3.0000 mg | ORAL_TABLET | Freq: Every evening | ORAL | 3 refills | Status: DC | PRN

## 2021-02-15 MED ORDER — POTASSIUM CHLORIDE 20 MEQ PO PACK: 20.0000 meq | PACK | Freq: Every day | ORAL | 3 refills | Status: DC

## 2021-02-15 MED ORDER — ONDANSETRON HCL 4 MG PO TABS: 4.0000 mg | ORAL_TABLET | Freq: Four times a day (QID) | ORAL | 3 refills | Status: DC | PRN

## 2021-02-15 MED ORDER — SM MELATONIN 3 MG PO TABS
3.0000 mg | ORAL_TABLET | Freq: Every evening | ORAL | 3 refills | Status: DC | PRN
Start: 2021-02-15 — End: 2021-07-19

## 2021-02-15 MED ORDER — FUROSEMIDE 40 MG PO TABS: 40.0000 mg | ORAL_TABLET | Freq: Every day | ORAL | 3 refills | Status: DC

## 2021-02-15 MED ORDER — ENSURE ENLIVE PO LIQD: 237.0000 mL | Freq: Two times a day (BID) | ORAL | 12 refills | Status: DC

## 2021-02-15 MED ORDER — DONEPEZIL HCL 5 MG PO TBDP: 5.0000 mg | ORAL_TABLET | Freq: Every day | ORAL | 3 refills | Status: DC

## 2021-02-15 MED ORDER — DULOXETINE HCL 30 MG PO CPEP: 30.0000 mg | ORAL_CAPSULE | Freq: Two times a day (BID) | ORAL | 3 refills | Status: DC

## 2021-02-15 MED ORDER — NUTRISOURCE FIBER PO PACK
1.0000 | PACK | Freq: Two times a day (BID) | ORAL | 1 refills | Status: DC
Start: 2021-02-15 — End: 2021-07-19

## 2021-02-15 MED ORDER — LEVETIRACETAM 100 MG/ML PO SOLN: 1000.0000 mg | Freq: Two times a day (BID) | ORAL | 12 refills | Status: DC

## 2021-02-15 MED ORDER — APIXABAN 5 MG PO TABS: 5.0000 mg | ORAL_TABLET | Freq: Two times a day (BID) | ORAL | 11 refills | Status: DC

## 2021-02-15 NOTE — TOC Transition Note (Addendum)
Transition of Care [facility]) - CM/SW Discharge Note   Patient Details  Name: [name] MRN: [id number] Date of Birth: 1950-11-23  Transition of Care [facility]) CM/SW Contact:  Janae Bridgeman, RN Phone Number: 02/15/2021, 8:11 AM   Clinical Narrative:    Case management spoke with Junious Silk, NP this morning and the patient will be able to discharge to home today with care being provided by the daughter and patient's husband.  [facility] will be providing home health services for RN, MSW and PT and I will notify Amy, RNCM with [facility] that patient is being discharged home today from the hospital.  [employer] with be providing 24-hour RT support and the therapist will meet the patient and family at the home once PTAR transfers the patient.  I called and spoke with Zack, CM with [employer] and nebulizer machine and supplies will be delivered to the home this afternoon and teaching with be provided through the RT at the home.  I will notify Zack, CM with [employer] to coordinate a home visit today as well.  CM and MSW will continue to follow the patient for discharge to the home.  02/15/2021 1230 - Zack Blank with [employer] will be delivering the patient's suction equipment for the hospital room in the next hour.  I spoke with the daughter the [facility] will be providing the patient transport home and the daughter will ride in the ambulance with the patient to the home.  PTAr was scheduled for 3 pm today and the patient's husband will be taking the daughter's car back home.  PTAR packet was coordinated to include medical necessity, facesheet, discharge summary and discharge AVS and was given to the secretary to deliver to the [facility] paramedics for transport.  I spoke with the bedside nurse and she is aware of the plan.  The patient will be transported home on her trach collar and home ventilator will be present if needed.  I provided the patient's daughter will a list of custodial care providers that are in  network with [employer].  I called Laurence Aly, RT with [employer] and she is aware that the patient will be transported home today at 3 pm and she will reach out to the daughter to meet with the patient at the home today after arrival at the home.  The patient will be discharged home with family today by PTAR at 3 pm today.   Final next level of care: Home w Home Health Services Barriers to Discharge: Continued Medical Work up   Patient Goals and CMS Choice Patient states their goals for this hospitalization and ongoing recovery are:: The patient's family plans to take the patient home for care with home health services through [facility] - pending discharge to home - possibly 02/15/21 or 02/16/2021 if medically stable to discharge. CMS Medicare.gov Compare Post Acute Care list provided to:: Patient Represenative (must comment) Choice offered to / list presented to : Adult Children  Discharge Placement                       Discharge Plan and Services In-house Referral: Clinical Social Work, Hospice / Palliative Care, PCP / Health Connect Discharge Planning Services: CM Consult Post Acute Care Choice: Durable Medical Equipment, Home Health          DME Arranged: Hospital bed (home ventilator supplied through [employer]) DME Agency: [facility] Date DME Agency Contacted: 02/12/21 Time DME Agency Contacted: [phone] Representative spoke with  at DME Agency: Laurence Aly / Alan Ripper, RT with [employer], [place] - electric hospital bed and respiratory supplies HH Arranged: RN, PT [facility] Agency: Other - See comment [facility]) Date [facility] Agency Contacted: 02/12/21 Time HH Agency Contacted: [phone] Representative spoke with at [facility] Agency: Meg, RNCM with [facility]  Social Determinants of Health (SDOH) Interventions     Readmission Risk Interventions Readmission Risk Prevention Plan 12/28/2020 11/24/2020 09/21/2020  Transportation Screening Complete Complete Complete  HRI or  Home Care Consult - Complete -  Social Work Consult for Recovery Care Planning/Counseling - Complete -  Palliative Care Screening - Not Applicable -  Medication Review [employer]) Complete Complete Complete  PCP or Specialist appointment within 3-5 days of discharge - - Complete  HRI or Home Care Consult Complete - Complete  SW Recovery Care/Counseling Consult Complete - Complete  Palliative Care Screening (No Data) - Complete  Skilled Nursing Facility Patient Refused - Complete  Some recent data might be hidden

## 2021-02-15 NOTE — Discharge Summary (Signed)
Physician Discharge Summary  [name] IDP:[id number] DOB: Mar 09, 1951 DOA: 01/03/2021  PCP: Lemmie Evens, MD  Admit date: 01/03/2021 Discharge date: 02/15/2021  Time spent:  minutes  Recommendations for Outpatient Follow-up:  Patient needs to follow-up with Dr. Zadie Rhine within 7 to 10 days after discharge.  Given her respiratory status and intermittent ventilator dependence it is possible a phone or video visit would be more appropriate if available at that office. Patient has been referred to hospice of [facility] to provide additional palliative care needs.  At the present time patient and family wish to continue aggressive care and remain a full code Patient will receive her respiratory supplies and other DME from [facility] oxygen this includes a fully electric hospital bed, home ventilator, suction equipment and supplies and tracheostomy supplies for home.  [employer] will also provide 24-hour telephone support for RT needs for home after discharge.  Once patient arrives to the home environment [facility] respiratory therapist will be providing nebulizer and supplies to home as well as instruction to provide treatment to the trach/vent at home. Patient also was attached to [facility] who apparently provide home visits.  Family has been instructed to contact them after discharge. Patient will need to follow-up with the [place] pulmonary trach clinic after discharge.  Was last changed to a #8 Shiley flex on 02/10/2009   Discharge Diagnoses:  Active Problems:   Acute respiratory failure with hypoxia and hypercapnia (HCC)   Acute on chronic respiratory failure with hypoxia and hypercapnia (HCC)   Hypercapnic respiratory failure (HCC)   Status post tracheostomy ([place])   Encephalopathy acute   Hemoptysis   Tracheostomy dependent (HCC)   Physical deconditioning   Insulin dependent type 2 diabetes mellitus, controlled ([place])   Ventilator dependence ([place])  SEPSIS  RULED OUT  Discharge Condition: Stable  Diet recommendation: Regular diet when PMV in place.  Also has orders for supplemental Ensure or boost after discharge either by mouth or per tube.  Filed Weights   02/12/21 0412 02/13/21 0443 02/14/21 0406  Weight: 89.4 kg 87.4 kg 86.4 kg    History of present illness:  70 yo female who presented to [facility] on 7/03 with dyspnea, and altered mental status. Had admission from 12/21/20 to 12/30/20 for respiratory failure and CHF exacerbation.Transferred to [facility]. SpO2 65% and elevated PCO2.  Failed Bipap and required intubation. Course since then complicated by prolonged mechanical ventilation requiring tracheostomy 7/7. She has been managed by PCCM for 4 weeks, deemed to require home vent which is delayed by need for generator at home. This is being arranged. She was transferred to hospitalist service on 02/01/2021.   Hospital Course:   Acute on chronic hypoxemic and hypercarbic respiratory failure: Multifactorial including restrictive lung disease due to kyphoscoliosis, OHS/OSA, tracheobronchitis leading to hemoptysis. ILD/Pulmonary fibrosis - Per PCCM -Tolerating trach collar on date of discharge during the day and uses PRVC ventilatory support at night -?  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  Since CT was done during acute phase of illness cannot definitively determine that she has an amiodarone lung toxicity.  Amiodarone has been continued and patient may benefit from high-resolution CT in the outpatient setting to better clarify this issue. -PCCM d/w myself and dtr that pt will have days where when she is napping she will require PRVC support   Acute on chronic combined CHF LV systolic dysfunction EF 45 to 50%  Moderate RV systolic dysfunction Severe pulmonary hypertension 60 mmHg Mild to moderate MR and moderate TR:  -Cont BB; not on ARB/ACE  inhibitor secondary to suboptimal blood pressure reading - continue Lasix per tube 40 mg daily -Follow weights daily and document in notebook noting for any trends upward will more than 3 to 5 pounds over 7 days   Diarrhea with abdominal pain Resolved  Atrial fibrillation -Continue beta-blocker and amiodarone -Patient is to keep potassium >/= 4.0 while on amiodarone  -Eliquis resumed 8/9   Diabetes mellitus 2 on long-term insulin; controlled -A1c 7.6% -Continue Lantus after discharge   Severe physical deconditioning: -Not candidate for LTAC given chronicity of debility -Continue PT and OT after discharge-.  Appropriate DME procured by nurse case manager   Dysphagia s/p PEG: -Tolerating regular diet -Continue Prosource and intermittent Ensure either by mouth or per tube TID   History of CVA - Continue statin   Dementia: -Continue Aricept   Seizure disorder, depression - Continue sertraline, keppra -Has history of TD thus rationale for Cymbalta prior to admission-resumed 8/3   Stage IIIa CKD:  -Currently better than that diagnosis would suggest. - Avoid nephrotoxins -Follow creatinine closely with IV diuresis -current eGFR >60   Acute hypokalemia: - Resolved   Anemia of chronic disease exacerbated by acute blood loss anemia due to hemoptysis (improving):  -Hgb stable at 8.8-possibly a degree of hemodilution from acute heart failure -no further hemoptysis noted   Hypothyroidism:  -TSH 0.697 - Continue synthroid    Procedures: Echocardiogram Tracheostomy tube PEG tube  Consultations: PCCM Interventional radiology Video-assisted bronchoscopy  Antibiotics: Cefepime 7/3 through 7/8 Azithromycin 7/3 through 7/7 Vancomycin 7/3, 7/4 Cefazolin 7/11 x 1 dose Cefepime 7/27 through 7/31  Discharge Exam: Vitals:   02/15/21 0800 02/15/21 0823  BP: 120/80   Pulse: 83 89  Resp: 20 18  Temp:    SpO2: 96% 98%   Constitutional: NAD, pleasant affect, denies  nausea or abd pain Respiratory: Midline tracheostomy  #8.0 cuffed;  PRVC mode FiO2 30 %, rate 22, VT 510, PEEP 5 HS (did not use overnight into 8/10) and TC 35% FIO2 during the day; Lungs coarse but clear, no increased WOB, strong cough Cardiovascular: S1S2, AF w/ VR 80-90, no peripheral edema Abdomen: PEG for bolus feed-abd soft and NTTP, normoactive BS+ Neurologic: CN 2-12 grossly intact. Sensation intact,  Strength 3/5 x all 4 extremities.  Psychiatric: A and O X 3, interactive  Discharge Instructions   Discharge Instructions     Call MD for:  difficulty breathing, headache or visual disturbances   Complete by: As directed    Call MD for:  extreme fatigue   Complete by: As directed    Call MD for:  persistant nausea and vomiting   Complete by: As directed    Call MD for:  redness, tenderness, or signs of infection (pain, swelling, redness, odor or green/yellow discharge around incision site)   Complete by: As directed    Call MD for:  temperature >100.4   Complete by: As directed    Change dressing (specify)   Complete by: As directed    Dressing change: 2-3 times per day using warm water, pat dry then apply gauze dressing to PEG site.  Please change trach dressing daily and as needed (ie excessive secretions).  Cleanse with saline moistened gauze and pat dry then reapply trach gauze   Diet - low sodium heart healthy   Complete by: As directed    Diet general  Complete by: As directed    Discharge instructions   Complete by: As directed    After discharge RT will come to the home and redemonstrate proper usage of the ventilator.  They will also teach you how to use the nebulizer machine.  The nebulizer treatments are used as needed for shortness of breath.  For any questions regarding her respiratory status/breathing please contact her primary care doctor or the pulmonology immediately.  If she is having significant difficulty with breathing despite utilization of the  ventilator please call EMS/[phone] for further instructions/transport to the ER.  We encourage her to get out of bed to chair is much as possible.  You will be provided with a Harrel Lemon lift as well as safety accessories to use in the home including in the bathroom/shower.   Increase activity slowly   Complete by: As directed       Allergies as of 02/15/2021       Reactions   Citalopram Hydrobromide Other (See Comments)   Dyskinesia Other reaction(s): Other (See Comments) Dyskinesia   Codeine Nausea And Vomiting, Other (See Comments)   HALLUCINATIONS Other reaction(s): Unknown Other reaction(s): Other (See Comments) HALLUCINATIONS   Hydromorphone Hcl Other (See Comments)   Made her pass out Other reaction(s): Other (See Comments) Made her pass out   Metoclopramide Other (See Comments)   DYSKINESIA Other reaction(s): Other (See Comments) DYSKINESIA   Hyoscyamine Other (See Comments)   MADE DIARRHEA WORSE        Medication List     STOP taking these medications    Lactase 9000 units Chew   levETIRAcetam 500 MG tablet Commonly known as: Keppra Replaced by: levETIRAcetam 100 MG/ML solution   loperamide 2 MG capsule Commonly known as: IMODIUM Replaced by: loperamide HCl 1 MG/7.5ML suspension   LORazepam 0.5 MG tablet Commonly known as: ATIVAN   multivitamin with minerals Tabs tablet   OXYGEN   potassium chloride 10 MEQ tablet Commonly known as: KLOR-CON   Vitamin D3 50 MCG (2000 UT) capsule       TAKE these medications    acetaminophen 500 MG tablet Commonly known as: TYLENOL Place 1 tablet (500 mg total) into feeding tube as needed for moderate pain or headache. What changed: how to take this   amiodarone 200 MG tablet Commonly known as: PACERONE Place 1 tablet (200 mg total) into feeding tube daily. What changed:  how to take this when to take this   apixaban 5 MG Tabs tablet Commonly known as: Eliquis Place 1 tablet (5 mg total) into feeding tube 2  (two) times daily. What changed: how to take this   atorvastatin 80 MG tablet Commonly known as: LIPITOR Place 1 tablet (80 mg total) into feeding tube daily. What changed: how to take this   donepezil 5 MG disintegrating tablet Commonly known as: ARICEPT ODT Place 1 tablet (5 mg total) into feeding tube at bedtime.   DULoxetine 30 MG capsule Commonly known as: CYMBALTA Take 1 capsule (30 mg total) by mouth 2 (two) times daily.   feeding supplement (PROSource TF) liquid Place 45 mLs into feeding tube 3 (three) times daily. What changed:  how much to take how to take this when to take this   feeding supplement Liqd Take 237 mLs by mouth 2 (two) times daily between meals. What changed: You were already taking a medication with the same name, and this prescription was added. Make sure you understand how and when to take each.   fiber  Pack packet Place 1 packet into feeding tube 2 (two) times daily.   free water Soln Place 100 mLs into feeding tube every 6 (six) hours.   furosemide 40 MG tablet Commonly known as: LASIX Place 1 tablet (40 mg total) into feeding tube daily. What changed:  medication strength how much to take how to take this   gabapentin 100 MG capsule Commonly known as: NEURONTIN Take 1 capsule (100 mg total) by mouth 3 (three) times daily.   insulin glargine 100 UNIT/ML injection Commonly known as: LANTUS Inject 0.2 mLs (20 Units total) into the skin daily.   ipratropium-albuterol 0.5-2.5 (3) MG/3ML Soln Commonly known as: DUONEB Take 3 mLs by nebulization every 4 (four) hours as needed.   levETIRAcetam 100 MG/ML solution Commonly known as: KEPPRA Place 10 mLs (1,000 mg total) into feeding tube 2 (two) times daily. Replaces: levETIRAcetam 500 MG tablet   levothyroxine 175 MCG tablet Commonly known as: SYNTHROID Place 1 tablet (175 mcg total) into feeding tube daily at 6 (six) AM. Start taking on: February 16, 2021 What changed:  how to take  this when to take this   loperamide HCl 1 MG/7.5ML suspension Commonly known as: IMODIUM Place 15 mLs (2 mg total) into feeding tube as needed for diarrhea or loose stools. Replaces: loperamide 2 MG capsule   meclizine 12.5 MG tablet Commonly known as: ANTIVERT Place 1 tablet (12.5 mg total) into feeding tube 3 (three) times daily as needed for dizziness. What changed:  medication strength how much to take how to take this when to take this   melatonin 3 MG Tabs tablet Place 1 tablet (3 mg total) into feeding tube at bedtime as needed. What changed: You were already taking a medication with the same name, and this prescription was added. Make sure you understand how and when to take each.   SM Melatonin 3 MG Tabs tablet Generic drug: melatonin Place 1 tablet (3 mg total) into feeding tube at bedtime as needed. What changed:  how to take this reasons to take this   metoprolol tartrate 25 MG tablet Commonly known as: LOPRESSOR Place 1 tablet (25 mg total) into feeding tube 2 (two) times daily.   ondansetron 4 MG tablet Commonly known as: ZOFRAN Place 1 tablet (4 mg total) into feeding tube 4 (four) times daily as needed for nausea. What changed: See the new instructions.   pantoprazole 40 MG tablet Commonly known as: PROTONIX Take 1 tablet (40 mg total) by mouth 2 (two) times daily before a meal. What changed: See the new instructions.   potassium chloride 20 MEQ packet Commonly known as: KLOR-CON Place 20 mEq into feeding tube daily.   sertraline 25 MG tablet Commonly known as: ZOLOFT Place 1 tablet (25 mg total) into feeding tube daily.               Durable Medical Equipment  (From admission, onward)           Start     Ordered   02/15/21 0936  For home use only DME Ventilator  Once       Comments: Please provide home ventilator and supplies for tracheostomy  Question:  Length of Need  Answer:  Lifetime   02/15/21 0936   02/15/21 0935  For home  use only DME Suction  Once       Comments: Please provide respiratory supplies for tracheostomy suctioning.  Question:  Suction  Answer:  Lurline Idol   02/15/21 0935  02/15/21 0933  For home use only DME Hospital bed  Once       Comments: Patient needs fully-electric bed.  Question Answer Comment  Length of Need Lifetime   Patient has (list medical condition): ventilator dependent, Kyphoscoliosis, Restrictive lung disease, Chronic respiratory failure,DM type 2, Chronic a fib, Dementia, Chronic combined CHF, Hypothyroidism, Seizures, CKD 3a, CAD s/p NSTEMI, HLD, GERD   The above medical condition requires: Patient requires the ability to reposition frequently   Head must be elevated greater than: 30 degrees   Bed type Semi-electric   Hoyer Lift Yes   Support Surface: Gel Overlay      02/15/21 0933   02/15/21 0804  For home use only DME Nebulizer machine  Once       Question Answer Comment  Patient needs a nebulizer to treat with the following condition COPD (chronic obstructive pulmonary disease) ([place])   Length of Need Lifetime      02/15/21 0805   02/03/21 0817  For home use only DME Hospital bed  Once       Question Answer Comment  Length of Need Lifetime   Patient has (list medical condition): CHF, respiratory failure, tracheostomy and ventilatory support   The above medical condition requires: Patient requires the ability to reposition frequently   Head must be elevated greater than: 30 degrees   Bed type Semi-electric   Support Surface: Gel Overlay      02/03/21 0817              Discharge Care Instructions  (From admission, onward)           Start     Ordered   02/15/21 0000  Change dressing (specify)       Comments: Dressing change: 2-3 times per day using warm water, pat dry then apply gauze dressing to PEG site.  Please change trach dressing daily and as needed (ie excessive secretions).  Cleanse with saline moistened gauze and pat dry then reapply trach gauze    02/15/21 1042           Allergies  Allergen Reactions   Citalopram Hydrobromide Other (See Comments)    Dyskinesia Other reaction(s): Other (See Comments) Dyskinesia   Codeine Nausea And Vomiting and Other (See Comments)    HALLUCINATIONS Other reaction(s): Unknown Other reaction(s): Other (See Comments) HALLUCINATIONS    Hydromorphone Hcl Other (See Comments)    Made her pass out Other reaction(s): Other (See Comments) Made her pass out   Metoclopramide Other (See Comments)    DYSKINESIA Other reaction(s): Other (See Comments) DYSKINESIA   Hyoscyamine Other (See Comments)    [facility] Follow up.   Why: [facility] will be providing fully electric hospital bed, home ventilator, suction equipment and supplies, and tracheostomy supplies for home.  [facility] will also provide 24 hour telephone support for RT needs for home when discharged. [facility]'s respiratory therapists will be providing nebulizer and supplies to the home and will give instructions to provide treatments through the trach/ vent at home. Contact information: [place] [address] [phone]         [facility] Follow up.   Specialty: Home Health Services Why: [facility] will be providing home health RN, PT, home health aide, and MSW. Contact information: [place] [address] [place] Follow up.  Why: Referral was made to [facility] to provide palliative care support as outpatient. Contact information: [address] [place] [address] [phone]         [facility]. Schedule an appointment as soon as possible for a visit.   Why: Please follow up with [facility] for Medical follow up at the home. Contact information: [phone]        Lemmie Evens, MD. Schedule an appointment as soon as possible for a  visit.   Specialty: Family Medicine Why: Please make a follow up appointment with the primary care physician within 7-10 days of discharge from the hospital for a follow up. Contact information: [place] [address] [phone]         Erick Colace, NP. Schedule an appointment as soon as possible for a visit in 2 week(s).   Specialties: Nurse Practitioner, Acute Care, Pulmonary Disease Why: To be seen as an outpatient at the trach clinic with [facility] pulmonary medicine Contact information: [address] [place] [address] [phone]                  The results of significant diagnostics from this hospitalization (including imaging, microbiology, ancillary and laboratory) are listed below for reference.    Significant Diagnostic Studies: DG Abd 1 View  Result Date: 01/22/2021 CLINICAL DATA:  Emesis. EXAM: ABDOMEN - 1 VIEW COMPARISON:  January 09, 2021. FINDINGS: Mildly dilated small bowel loops are noted in left upper quadrant suggesting possible ileus. Status post cholecystectomy. Gastrostomy tube is seen in left upper quadrant. No radio-opaque calculi or other significant radiographic abnormality are seen. IMPRESSION: Mildly dilated small bowel loops are noted in left upper quadrant suggesting possible ileus. Gastrostomy tube is noted. Electronically Signed   By: Marijo Conception M.D.   On: 01/22/2021 11:32   CT CHEST WO CONTRAST  Result Date: 02/09/2021 CLINICAL DATA:  70 year old female with history of amiodarone use. Evaluate for potential amiodarone pulmonary toxicity. EXAM: CT CHEST WITHOUT CONTRAST TECHNIQUE: Multidetector CT imaging of the chest was performed following the standard protocol without IV contrast. COMPARISON:  Chest CT 01/03/2021. FINDINGS: Cardiovascular: Heart size is enlarged. There is no significant pericardial fluid, thickening or pericardial calcification. There is aortic atherosclerosis, as well as  atherosclerosis of the great vessels of the mediastinum and the coronary arteries, including calcified atherosclerotic plaque in the left anterior descending coronary artery. Severe calcifications of the mitral annulus. Dilatation of the pulmonic trunk (4.0 cm in diameter). Mediastinum/Nodes: No pathologically enlarged mediastinal or hilar lymph nodes. Please note that accurate exclusion of hilar adenopathy is limited on noncontrast CT scans. Esophagus is unremarkable in appearance. No axillary lymphadenopathy. Tracheostomy tube in position with tip terminating 6.3 cm above the level of the carina. Lungs/Pleura: Patchy areas of ground-glass attenuation are noted throughout the lungs bilaterally. In addition, there are areas of profound thickening of the peribronchovascular interstitium with regional areas of architectural distortion and volume loss, similar to the prior study 01/03/2021. None of these areas of architectural distortion demonstrate definitive internal high attenuation. Trace left pleural effusion. No right pleural effusion. Upper Abdomen: Aortic atherosclerosis. Musculoskeletal: Old healed fracture of the upper sternum. Chronic appearing compression fractures at T3 and T12 with post vertebroplasty changes at T12 where there is 40% loss of anterior vertebral body height. There are no aggressive appearing lytic or blastic lesions noted in the visualized portions of the skeleton. IMPRESSION: 1. There are fibrotic changes in the lungs,  as above, suggestive of probable amiodarone induced pulmonary toxicity. Alternatively, some of these findings could be seen in the setting of chronic post infectious or inflammatory scarring or recurrent chronic aspiration. 2. Dilatation of the pulmonic trunk (4.0 cm in diameter), concerning for associated pulmonary arterial hypertension. 3. Cardiomegaly. 4. Aortic atherosclerosis, in addition to left anterior descending coronary artery disease. Assessment for potential  risk factor modification, dietary therapy or pharmacologic therapy may be warranted, if clinically indicated. Aortic Atherosclerosis (ICD10-I70.0). Electronically Signed   By: Vinnie Langton M.D.   On: 02/09/2021 09:36   DG CHEST PORT 1 VIEW  Result Date: 02/03/2021 CLINICAL DATA:  Hypoxia. EXAM: PORTABLE CHEST 1 VIEW COMPARISON:  01/28/2021. FINDINGS: Tracheostomy tube in stable position. Mitral annular calcification. Stable prominent cardiomegaly. Persistent left mid and lower left lung atelectasis/infiltrates. No interim change. No prominent pleural effusion. No pneumothorax. IMPRESSION: 1.  Tracheostomy tube in stable position. 2.  Persistent prominent cardiomegaly. 3. Persistent left mid and lower left lung atelectasis/infiltrates. No interim change. Electronically Signed   By: Marcello Moores  Register   On: 02/03/2021 07:39   DG CHEST PORT 1 VIEW  Result Date: 01/28/2021 CLINICAL DATA:  Atelectasis. EXAM: PORTABLE CHEST 1 VIEW COMPARISON:  01/27/2021. FINDINGS: Tracheostomy tube and right PICC line in stable position. Persistent severe cardiomegaly. Interim improvement of interstitial prominence suggesting clearing interstitial edema. Bibasilar atelectasis. Left base infiltrate cannot be excluded. Small left pleural effusion again noted. No right pleural effusion noted on today's exam. No pneumothorax. IMPRESSION: 1.  Tracheostomy tube and right PICC line stable position. 2.  Persistent severe cardiomegaly. 3. Interim improvement of interstitial prominence suggesting clearing interstitial edema. Bibasilar atelectasis. Left base infiltrate cannot be excluded. Small left pleural effusion again noted. No right pleural effusion noted on today's exam. Electronically Signed   By: Marcello Moores  Register   On: 01/28/2021 06:02   DG CHEST PORT 1 VIEW  Result Date: 01/27/2021 CLINICAL DATA:  Tracheostomy placement EXAM: PORTABLE CHEST 1 VIEW COMPARISON:  01/26/2021 FINDINGS: Cardiomegaly. Right upper extremity PICC.  Interval placement of tracheostomy, appliance tip projecting in the mid trachea. Small, layering bilateral pleural effusions. The visualized skeletal structures are unremarkable. IMPRESSION: 1. Interval placement of tracheostomy, appliance tip projecting in the mid trachea. 2. Small, layering bilateral pleural effusions. 3. Cardiomegaly. Electronically Signed   By: Eddie Candle M.D.   On: 01/27/2021 12:33   DG Chest Port 1 View  Result Date: 01/26/2021 CLINICAL DATA:  Hemoptysis. EXAM: PORTABLE CHEST 1 VIEW COMPARISON:  01/25/2021. FINDINGS: Endotracheal tube tip noted 1.5 cm above the lower portion of the carina. Proximal repositioning of approximately 2 cm should be considered. Near complete opacification of the left hemithorax noted. This is most likely secondary to large left pleural effusion with probable underlying infiltrate. Mild right base infiltrate cannot be excluded. No right pleural effusion. No pneumothorax. Heart size difficult to evaluate due to left hemithorax opacification. Mitral annular calcification. IMPRESSION: 1. Endotracheal tube tip noted 1.5 cm above the lower portion of the carina. Proximal repositioning of approximately 2 cm should be considered. 2. Near complete opacification of left hemithorax noted. This is most likely secondary to large left pleural effusion with probable underlying infiltrate. Mild right base infiltrate cannot be excluded. Electronically Signed   By: Marcello Moores  Register   On: 01/26/2021 05:22   DG Chest Port 1 View  Result Date: 01/25/2021 CLINICAL DATA:  Acute respiratory failure EXAM: PORTABLE CHEST 1 VIEW COMPARISON:  01/24/2021 FINDINGS: Endotracheal tube is approximately 2 cm above the carina. Unchanged  right PICC line. Improved aeration of the left lung. Remains patchy opacities bilaterally. Probable small pleural effusions. Cardiomegaly. No pneumothorax. IMPRESSION: Improved aeration of the left lung. Remains patchy atelectasis/consolidation bilaterally.  Cardiomegaly. Electronically Signed   By: Macy Mis M.D.   On: 01/25/2021 07:35   DG Chest Port 1 View  Result Date: 01/24/2021 CLINICAL DATA:  Acute respiratory failure EXAM: PORTABLE CHEST 1 VIEW COMPARISON:  Chest radiograph from one day prior. FINDINGS: Endotracheal tube tip is 2.9 cm above the carina. Right PICC terminates over the cavoatrial junction. Persistent complete opacification of the left hemithorax with left mediastinal shift. No pneumothorax. No right pleural effusion. Similar hazy right lung base opacity. IMPRESSION: 1. Well-positioned support structures. 2. Persistent complete opacification of the left hemithorax with left mediastinal shift, compatible with atelectasis and/or pneumonia. 3. Similar hazy right lung base opacity, favor atelectasis. Electronically Signed   By: Ilona Sorrel M.D.   On: 01/24/2021 08:06   DG Chest Port 1 View  Result Date: 01/23/2021 CLINICAL DATA:  Acute respiratory failure, altered mental status EXAM: PORTABLE CHEST 1 VIEW COMPARISON:  01/18/2021 chest radiograph. FINDINGS: Tracheostomy tube tip overlies the tracheal air column at the thoracic inlet. Right PICC terminates over the cavoatrial junction. New complete opacification of the left hemithorax with associated volume loss and left mediastinal shift. Stable cardiomegaly. No pneumothorax. No right pleural effusion. Similar right basilar hazy opacity. No pulmonary edema. IMPRESSION: 1. New complete opacification of the left hemithorax with associated volume loss, compatible with complete left lung atelectasis, probably due to mucoid impaction given the rapid change. 2. Similar right basilar hazy opacity, favor atelectasis. Electronically Signed   By: Ilona Sorrel M.D.   On: 01/23/2021 08:08   DG CHEST PORT 1 VIEW  Result Date: 01/18/2021 CLINICAL DATA:  PICC line placement EXAM: PORTABLE CHEST 1 VIEW COMPARISON:  01/18/2021 at 7:25 p.m. FINDINGS: Single frontal view of the chest demonstrates  tracheostomy tube overlying tracheal air column unchanged. Right-sided PICC line tip overlies superior vena cava. Cardiac silhouette remains enlarged. Persistent left basilar consolidation unchanged. No large effusion or pneumothorax. IMPRESSION: 1. Right-sided PICC tip overlies superior vena cava. 2. Stable tracheostomy tube. 3. Persistent left basilar consolidation. Electronically Signed   By: Randa Ngo M.D.   On: 01/18/2021 23:34   DG CHEST PORT 1 VIEW  Result Date: 01/18/2021 CLINICAL DATA:  Central line care exposed catheter not in original position EXAM: PORTABLE CHEST 1 VIEW COMPARISON:  01/10/2021 FINDINGS: Marked patient rotation. Tracheostomy tube with tip about 4.1 cm superior tip carina. Removal of previously noted esophageal tubes. Right upper extremity central venous catheter has been retracted, the tip is faintly visible over the brachiocephalic confluence. Cardiomegaly with persistent airspace disease at left base. IMPRESSION: 1. Right upper extremity central venous catheter tip is retracted, tip faintly visible in the region of brachiocephalic confluence allowing for marked patient rotation. 2. Cardiomegaly with persistent dense airspace disease at left base Electronically Signed   By: Donavan Foil M.D.   On: 01/18/2021 20:22   DG Abd Portable 1V  Result Date: 02/04/2021 CLINICAL DATA:  Altered mental status with abdominal pain. EXAM: PORTABLE ABDOMEN - 1 VIEW COMPARISON:  01/22/21 FINDINGS: Gastrostomy tube is again noted in the left upper quadrant of the abdomen. Mild diffuse gaseous distension of the colon is noted with gas up to the level of the rectum. There is been interval decrease in small bowel gas compared with the previous exam. No signs to suggest bowel obstruction. IMPRESSION: 1. Interval  decrease in small bowel gas compared with the previous exam. 2. Persistent mild gaseous distension of the colon. Electronically Signed   By: Kerby Moors M.D.   On: 02/04/2021 15:13    [place] EKG SITE RITE  Result Date: 01/18/2021 If Site Rite image not attached, placement could not be confirmed due to current cardiac rhythm.   Microbiology: Recent Results (from the past 240 hour(s))  MRSA Next Gen by PCR, Nasal     Status: None   Collection Time: 02/07/21 12:16 PM   Specimen: Nasal Mucosa; Nasal Swab  Result Value Ref Range Status   MRSA by PCR Next Gen NOT DETECTED NOT DETECTED Final    Comment: (NOTE) The GeneXpert MRSA Assay (FDA approved for NASAL specimens only), is one component of a comprehensive MRSA colonization surveillance program. It is not intended to diagnose MRSA infection nor to guide or monitor treatment for MRSA infections. Test performance is not FDA approved in patients less than 1 years old. Performed at [facility], [place] [address], [place], [place] [address]      Labs: Basic Metabolic Panel: Recent Labs  Lab 02/10/21 0156 02/11/21 0301 02/12/21 0459 02/13/21 0149 02/14/21 0141  NA 136 135 137 134* 135  K 4.4 4.5 4.1 3.9 3.6  CL 103 101 100 101 103  CO2 _0 GLUCOSE 167* 202* 149* 146* 181*  BUN 27* 25* 24* 26* 26*  CREATININE 0.82 0.79 0.78 0.85 0.93  CALCIUM 8.9 8.8* 9.1 8.7* 8.8*   Liver Function Tests: No results for input(s): AST, ALT, ALKPHOS, BILITOT, PROT, ALBUMIN in the last 168 hours. No results for input(s): LIPASE, AMYLASE in the last 168 hours. No results for input(s): AMMONIA in the last 168 hours. CBC: Recent Labs  Lab 02/10/21 0156 02/11/21 0301 02/12/21 0459 02/13/21 0149 02/14/21 0141  WBC 9.4 7.1 8.0 8.1 7.5  NEUTROABS 5.9 4.1 4.5 4.7 4.3  HGB 9.4* 8.9* 9.0* 9.3* 9.4*  HCT 31.7* 28.7* 29.1* 28.8* 29.2*  MCV 96.1 94.1 93.6 92.0 91.3  PLT 221 190 225 215 235   Cardiac Enzymes: No results for input(s): CKTOTAL, CKMB, CKMBINDEX, TROPONINI in the last 168 hours. BNP: BNP (last 3 results) Recent Labs    11/21/20 1656 12/21/20 0953 01/03/21 1527  BNP 425.0* 453.0* 614.0*     ProBNP (last 3 results) No results for input(s): PROBNP in the last 8760 hours.  CBG: Recent Labs  Lab 02/14/21 1507 02/14/21 2026 02/14/21 2355 02/15/21 0336 02/15/21 0754  GLUCAP 160* 173* 202* 180* 137*       Signed:  Erin Hearing ANP [facility] 02/15/2021, 10:49 AM

## 2021-02-15 NOTE — Progress Notes (Addendum)
NAME:  [name], MRN:  [id number], DOB:  Nov 10, 1950, LOS: 43 ADMISSION DATE:  01/03/2021, CONSULTATION DATE:  7/3 REFERRING MD:  EDP/APH, CHIEF COMPLAINT:  dyspnea   History of Present Illness:  70 yo female presented to [facility] on 7/03 with dyspnea, and altered mental status.    Had admission from 12/21/20 to 12/30/20 for respiratory failure and CHF exacerbation.Transferred to [facility]. SpO2 65% and elevated PCO2.  Failed Bipap and required intubation. Course since then complicated by prolonged mechanical ventilation requiring tracheostomy 7/7  Pertinent  Medical History  Kyphoscoliosis, Restrictive lung disease, Chronic respiratory failure on 2 liters, DM type 2, Chronic a fib on eliquis, Dementia, Chronic combined CHF, Hypothyroidism, Seizures, CKD 3a, CAD s/p NSTEMI, HLD, GERD  Significant Hospital Events: Including procedures, antibiotic start and stop dates in addition to other pertinent events   7/3  chronic HCRF from OHS and restrictive lung dz as well as deconditioning-->progression -->decompensated HFpEF and cor pulmonale-->worsening metabolic enceph from HCRF-->PNA --> acute on chronic resp failure. Admitted. Intubated. Ceftriaxone 7/3 x 1. Azith and Cefepime started. 7/4-7/5 Hypotension overnight. A line placed. LR bolus. Started on NE. RUE PICC placed 7/6 Hgb 6.7 (7.4), CT A/P completed to r/o RP bleed (negative), showed mild peripancreatic stranding. Lipase slightly elevated. 1U PRBCs ordered. 7/7 Tracheostomy placed, heparin gtt held for procedure. 7/8 Increased agitation with Propofol wean, increased HR, uptrending pressor requirements. Cortrak placed. Azith and cefepime stopped. 7/11 PEG tube placed, tolerating PSV 7/12 ATC trials started. Tubefeeds started/resumed 7/13 solucortef decreased to daily. Midodrine stopped. Heparin gtt stopped. Stated back on DOAC. PANDA removed 7/14: sat at side of bed w/ max assist for 15 minutes. Fatigued quickly. PT recommending LTAC  setting 7/15: Tolerated trach collar yesterday 7/16: TCT x 2 hours 7/18 hgb 6.8 received 1 unit PRBC. LTACH appeal denied 7/22 Tolerating brief periods of trach collar and PMV, Periods of emesis >> TFs held, Bright red blood noted by RT 7/23 left atelectasis, bronchoscopy showing inspissated clot occluding distal trachea, needed cryo probe to remove clot down to left mainstem 7/20 4 repeat bronchoscopy with cryoprobe to remove residual clot left upper lobe and lower lobe, oozing of underlying mucosa, distal clot remains 7/23 atx due to clot. Trach replaced with ETT for purpose of bronchoscopy. Inspissated clot suctioned. No obvious proximal bleeding source identified. Required TXA nebs and Cryoprobe 7/25 still w/ bloody trach secretions. Tolerating CPAP 7/26 hgb down 6.9 1 u PRBC transfused; complete white out of left hemithorax (again). Underwent bronch w/ cryoprobe for removal of clots. large clot in the left lower lobe as well as friable mucosa throughout the left tracheobronchial tree. Low grade temp. Concern for possible VAP/HCAP so sputum sent and Cefepime started. 7/27: Trach placement 7/29 tolerating PSV  7/30 ATC 8/5-8/6 tolerating atc during day  8/15 ATC during day. Daughter said they might be going home  Interim History / Subjective:  Had some anxiety last night and felt like she was gagging   Doing well on ATC now   Objective   Blood pressure 105/89, pulse 66, temperature 98.4 F (36.9 C), resp. rate (!) 23, height 5\' 8"  (1.727 m), weight 86.4 kg, SpO2 96 %.    Vent Mode: AC FiO2 (%):  [28 %] 28 % Set Rate:  [22 bmp] 22 bmp PEEP:  [5 cmH20] 5 cmH20   Intake/Output Summary (Last 24 hours) at 02/15/2021 0728 Last data filed at 02/15/2021 0600 Gross per 24 hour  Intake 680 ml  Output 1000 ml  Net -320 ml   Filed Weights   02/12/21 0412 02/13/21 0443 02/14/21 0406  Weight: 89.4 kg 87.4 kg 86.4 kg    Examination: Chronically ill elderly F reclined in bed eating  breakfast, NAD  NCAT Trach secure anicteric sclera pink mm  CTAb on ATC. Symmetrical chest expansion, even and unlabored  Rrr s1s2 no rgm  Soft round ndnt + bowel sounds  No acute joint deformity no cyanosis or clubbing  Pale, c/d/w.  Awake alert following commands.  Pleasant mood and congruent affect   Resolved Hospital Problem list   Septic shock from pneumonia Left upper extremity redness and swelling resolved Relative adrenal insufficiency -Weaned off steroids 7/15 Acute metabolic encephalopathy 2nd to sepsis and hypercapnia - improved Hemoptysis w/ resultant atelectasis/lung collapse. Required trach removal and oral intubation 7/23 to facilitate bronchoscopy and cryoprobe. Ileus (7/22) Hemoptysis with resultant lung collapse s/p cryoprobe again  Leukocytosis Enterobacter Pneumonia  Assessment & Plan:   Pulm problem list:   Chronic hypoxic and hypercapnic respiratory failure, s/p tracheostomy with nocturnal vent dependence  Chronic OSA, OHS -- intolerant of NPPV Physical deconditioning  P -not a candidate for decannulation -mandatory nocturnal ventilation, indefinitely  -Trach collar during day ok as tolerated, cannot be on trach collar overnight or if sleeping during day  -Family is committed to maintaining care at home with home vent  -Daughter needs to be comfortable with suctioning, trach care, inflating and deflating cuff, PMV, home vent -PT/OT -PRN robitussin    Other active problems:  Acute on chronic hypoxemic and hypercapnic respiratory failure  Tracheostomy dependence  Ventilator dependence  Kyphoscoliosis OHS/OSA Tracheobronchitis leading to hemoptysis (resolved) Acute on chronic combined CHF Permanent atrial fibrillation Hyperlipidemia History of CVA Dementia Seizure disorder Depression Severe physical deconditioning DM2 CKD 3a Intermittent fluid and Electrolyte imbalance  Anemia of chronic disease Hypothyroidism Dysphagia now S/P PEG Loose  stool   Best Practice (right click and "Reselect all SmartList Selections" daily)   Per primary     Tessie Fass MSN, AGACNP-BC [place] [facility] Amion for pager  02/15/2021, 7:28 AM

## 2021-02-15 NOTE — Progress Notes (Signed)
Occupational Therapy Treatment Patient Details Name: [name] MRN: [id number] DOB: February 02, 1951 Today's Date: 02/15/2021    History of present illness 70 y.o. female admitted 01/03/21 with acute on chronic respiratory failure with hypoxia. Intubated 7/3-present. 7/7: trachostomy placement. 7/27 occlusion of left lower lobe, trach replaced. PMHx:obesity, T2DM, chronic atrial fibrillation on Eliquis, dementia, diastolic CHF with chronic hypoxemia on 2 L nasal cannula, hypothyroidism, seizure disorder, CKD stage III, CAD with prior NSTEMI, dyslipidemia, and GERD.   OT comments  Patient making great progress toward goals. Donned LB clothing with Min A and increased time/effort in sitting/standing. Patient also ambulated 16f x4 trials with seated rest breaks between in hospital room on 4L via trach collar and FiO2 35%. All goals met and upgraded. Patient anticipating return home today with daughter.    Follow Up Recommendations  Home health OT;Supervision/Assistance - 24 hour    Equipment Recommendations  Hospital bed;Wheelchair (measurements OT);Wheelchair cushion (measurements OT)    Recommendations for Other Services      Precautions / Restrictions Precautions Precautions: Fall Precaution Comments: 4L and FiO2 35% on trach collar; ok to place passy muir valve on for therapies Restrictions Weight Bearing Restrictions: No       Mobility Bed Mobility Overal bed mobility: Needs Assistance Bed Mobility: Sidelying to Sit;Rolling Rolling: Min assist Sidelying to sit: HOB elevated;Min assist       General bed mobility comments: Patient able to advance BLE toward EOB with increased time. Min A to bring trunk upright with HOB elevated. Requires increased time/effort.    Transfers Overall transfer level: Needs assistance Equipment used: Rolling walker (2 wheeled) Transfers: Sit to/from SOmnicareSit to Stand: Min guard;Min assist Stand pivot transfers: Min guard        General transfer comment: Min gurad for sit to stand from low recliner x5. Cues for hand placement and proximity to chair.    Balance Overall balance assessment: Needs assistance Sitting-balance support: Bilateral upper extremity supported Sitting balance-Leahy Scale: Fair Sitting balance - Comments: Supervision A for static sitting at EOB. No LOB with reaching outside of BOS.   Standing balance support: Bilateral upper extremity supported;During functional activity Standing balance-Leahy Scale: Fair Standing balance comment: Reliant on at least unilateral UE support on RW during LB dressing.                           ADL either performed or assessed with clinical judgement   ADL Overall ADL's : Needs assistance/impaired                     Lower Body Dressing: Minimal assistance;Sit to/from stand Lower Body Dressing Details (indicate cue type and reason): Able to don footwear seated EOB with set-up assist and increased time. Min A to thread LLE through mesh panties and hike over hips in standing with use of RW. Able to thread RLE with increased time. Toilet Transfer: MDesigner, television/film setDetails (indicate cue type and reason): Simulated with transfer to recliner. Toileting- Clothing Manipulation and Hygiene: Minimal assistance;Sit to/from stand       Functional mobility during ADLs: Min guard General ADL Comments: Making great progress toward goals.     Vision       Perception     Praxis      Cognition Arousal/Alertness: Awake/alert Behavior During Therapy: WFL for tasks assessed/performed Overall Cognitive Status: Impaired/Different from baseline Area of Impairment: Awareness;Attention  Orientation Level:  (NT) Current Attention Level: Sustained   Following Commands: Follows one step commands consistently   Awareness: Emergent   General Comments: Follows 1-step verbal commands with good accuracy this date;  incresed time to process verbal information.        Exercises     Shoulder Instructions       General Comments SpO2 98-100% with mobility on 4L via trach collar and FiO2 35%.    Pertinent Vitals/ Pain       Pain Assessment: Faces Faces Pain Scale: Hurts a little bit Pain Location: Generalized with movement Pain Descriptors / Indicators: Sore Pain Intervention(s): Monitored during session  Home Living                                          Prior Functioning/Environment              Frequency  Min 2X/week        Progress Toward Goals  OT Goals(current goals can now be found in the care plan section)  Progress towards OT goals: Progressing toward goals  Acute Rehab OT Goals Patient Stated Goal: To get stronger OT Goal Formulation: With patient/family Time For Goal Achievement: 02/17/21 Potential to Achieve Goals: Fair ADL Goals Additional ADL Goal #1: Patient will complete UB/LB bathing/dressing with supervision A and AD/DME PRN. Additional ADL Goal #2: Patient will follow 1-2 step verbal commands with 100% accuracy in prep for ADLs. Additional ADL Goal #3: Patient will complete simulated walk-in shower transfers with supervision A and use of RW.  Plan Frequency remains appropriate;Discharge plan needs to be updated    Co-evaluation                 AM-PAC OT "6 Clicks" Daily Activity     Outcome Measure   Help from another person eating meals?: A Little Help from another person taking care of personal grooming?: A Lot Help from another person toileting, which includes using toliet, bedpan, or urinal?: A Lot Help from another person bathing (including washing, rinsing, drying)?: A Lot Help from another person to put on and taking off regular upper body clothing?: A Lot Help from another person to put on and taking off regular lower body clothing?: A Lot 6 Click Score: 13    End of Session Equipment Utilized During Treatment:  Gait belt;Oxygen;Other (comment) (6L and 35% FiO2)  OT Visit Diagnosis: Muscle weakness (generalized) (M62.81)   Activity Tolerance Patient tolerated treatment well;Patient limited by fatigue   Patient Left in chair;with call bell/phone within reach;with family/visitor present   Nurse Communication Mobility status;Other (comment) (Response to treatment; ability to transfer to BSC for toileting.)        Time: 1032-1107 OT Time Calculation (min): 35 min  Charges: OT General Charges $OT Visit: 1 Visit OT Treatments $Self Care/Home Management : 8-22 mins $Therapeutic Activity: 8-22 mins   H. OTR/L Supplemental OT, Department of rehab services [phone]    R H. 02/15/2021, 11:50 AM  

## 2021-02-15 NOTE — Plan of Care (Signed)
Daughter is educated and prepared to assist the patient with ADLs and care at home.

## 2021-02-15 NOTE — Progress Notes (Signed)
  Speech Language Pathology Treatment: Dysphagia  Patient Details Name: [name] MRN: [id number] DOB: 1950/09/10 Today's Date: 02/15/2021 Time: 4496-7591 SLP Time Calculation (min) (ACUTE ONLY): 12 min  Assessment / Plan / Recommendation Clinical Impression  Pt was seen for dysphagia treatment with PMSV in place. Respiratory support was reduced during speech, but speech was intelligible. Pt reported that she has been tolerating the current diet without overt s/sx of aspiration. She stated that her sister has her dentures, but that she has been tolerating meals well. Trials were limited since pt stated that she was full, but she tolerated regular texture solids and thin liquids without overt s/sx of aspiration. Mastication was functional and oral clearance adequate. Pt stated that she would rather not attempt PMSV donning/doffing during the session since her daughter is independent with this. Pt currently has discharge orders and continued SLP services are recommended following discharge.    HPI HPI: Pt is a 70 y/o female presented to [facility] on 7/03 with dyspnea and altered mental status.  Transferred to [facility]; failed BiPAP and required intubation. 7/7 trach; 7/11 PEG. Dx acute on chronic hypoxic/hypercapnic respiratory failure from acute pulmonary edema, pneumonia; severe deconditioning; metabolic encephalopathy.  Had admission from 12/21/20 to 12/30/20 for respiratory failure and CHF exacerbation; was on dysphagia 3 and thin liquids on 6/27. PMHx: Kyphoscoliosis, Restrictive lung disease, Chronic respiratory failure on 2 liters, DM type 2, Chronic a fib on eliquis, Dementia, Chronic combined CHF, Hypothyroidism, Seizures, CKD 3a, CAD s/p NSTEMI, HLD, GERD.      SLP Plan  Continue with current plan of care       Recommendations  Diet recommendations: Regular;Thin liquid Liquids provided via: Cup;Straw Medication Administration: Via alternative means (may be given crushed) Supervision: Full  supervision/cueing for compensatory strategies Compensations: Slow rate Postural Changes and/or Swallow Maneuvers: Seated upright 90 degrees      Patient may use Passy-Muir Speech Valve: During all therapies with supervision;During PO intake/meals PMSV Supervision: Full         Oral Care Recommendations: Oral care BID Follow up Recommendations: Home health SLP SLP Visit Diagnosis: Dysphagia, oropharyngeal phase (R13.12) Plan: Continue with current plan of care       Kalijah Westfall I. Vear Clock, MS, CCC-SLP Acute Rehabilitation Services Office number [phone] Pager [phone]                [name] 02/15/2021, 1:38 PM

## 2021-02-15 NOTE — Progress Notes (Signed)
Called to patient's room by daughter due to patient complaining of feeling like "something is in her throat". Daughter stated she had suctioned, RN suctioned and added air to cuff. Pt SPo2 99% and pt does not appear to be in any distress, just gagging and stating she feels like something is in her throat. Daughter states her mom "does not feel like her tummy is upset, just something in the back of her throat making her gag". I listened to patient and asked did she feel like she needed suctioning, pt states yes. I suctioned, added some air to cuff in case leak around and air coming up through her throat. Pt still continuing to gag. Daughter suggesting it could be anxiety, RN was asked for medication but pt unable to have anything at this time. Daughter and I discussed trying to take her off vent and place on trach collar to see if she still feels that way. Pt was placed on ATC and when asked if that feels better, she stated "yes".

## 2021-02-16 DIAGNOSIS — I129 Hypertensive chronic kidney disease with stage 1 through stage 4 chronic kidney disease, or unspecified chronic kidney disease: Secondary | ICD-10-CM | POA: Diagnosis not present

## 2021-02-16 DIAGNOSIS — R5381 Other malaise: Secondary | ICD-10-CM | POA: Diagnosis not present

## 2021-02-16 DIAGNOSIS — Z930 Tracheostomy status: Secondary | ICD-10-CM | POA: Diagnosis not present

## 2021-02-16 DIAGNOSIS — G9340 Encephalopathy, unspecified: Secondary | ICD-10-CM | POA: Diagnosis not present

## 2021-02-16 DIAGNOSIS — R269 Unspecified abnormalities of gait and mobility: Secondary | ICD-10-CM | POA: Diagnosis not present

## 2021-02-16 DIAGNOSIS — N1831 Chronic kidney disease, stage 3a: Secondary | ICD-10-CM | POA: Diagnosis not present

## 2021-02-16 DIAGNOSIS — J9621 Acute and chronic respiratory failure with hypoxia: Secondary | ICD-10-CM | POA: Diagnosis not present

## 2021-02-16 DIAGNOSIS — I5043 Acute on chronic combined systolic (congestive) and diastolic (congestive) heart failure: Secondary | ICD-10-CM | POA: Diagnosis not present

## 2021-02-16 DIAGNOSIS — J9622 Acute and chronic respiratory failure with hypercapnia: Secondary | ICD-10-CM | POA: Diagnosis not present

## 2021-02-16 DIAGNOSIS — E119 Type 2 diabetes mellitus without complications: Secondary | ICD-10-CM | POA: Diagnosis not present

## 2021-02-16 DIAGNOSIS — M4102 Infantile idiopathic scoliosis, cervical region: Secondary | ICD-10-CM | POA: Diagnosis not present

## 2021-02-16 DIAGNOSIS — I5032 Chronic diastolic (congestive) heart failure: Secondary | ICD-10-CM | POA: Diagnosis not present

## 2021-02-16 DIAGNOSIS — I4891 Unspecified atrial fibrillation: Secondary | ICD-10-CM | POA: Diagnosis not present

## 2021-02-16 DIAGNOSIS — J9601 Acute respiratory failure with hypoxia: Secondary | ICD-10-CM | POA: Diagnosis not present

## 2021-02-16 LAB — AMIODARONE LEVEL
Amiodarone Lvl: 139 ng/mL — ABNORMAL LOW (ref 1000–2500)
N-Desethyl-Amiodarone: 267 ng/mL

## 2021-02-17 DIAGNOSIS — Z930 Tracheostomy status: Secondary | ICD-10-CM | POA: Diagnosis not present

## 2021-02-17 DIAGNOSIS — J9610 Chronic respiratory failure, unspecified whether with hypoxia or hypercapnia: Secondary | ICD-10-CM | POA: Diagnosis not present

## 2021-03-04 DIAGNOSIS — I5043 Acute on chronic combined systolic (congestive) and diastolic (congestive) heart failure: Secondary | ICD-10-CM | POA: Diagnosis not present

## 2021-03-04 DIAGNOSIS — R5381 Other malaise: Secondary | ICD-10-CM | POA: Diagnosis not present

## 2021-03-04 DIAGNOSIS — I129 Hypertensive chronic kidney disease with stage 1 through stage 4 chronic kidney disease, or unspecified chronic kidney disease: Secondary | ICD-10-CM | POA: Diagnosis not present

## 2021-03-04 DIAGNOSIS — E119 Type 2 diabetes mellitus without complications: Secondary | ICD-10-CM | POA: Diagnosis not present

## 2021-03-04 DIAGNOSIS — J9601 Acute respiratory failure with hypoxia: Secondary | ICD-10-CM | POA: Diagnosis not present

## 2021-03-04 DIAGNOSIS — J9621 Acute and chronic respiratory failure with hypoxia: Secondary | ICD-10-CM | POA: Diagnosis not present

## 2021-03-04 DIAGNOSIS — J9622 Acute and chronic respiratory failure with hypercapnia: Secondary | ICD-10-CM | POA: Diagnosis not present

## 2021-03-04 DIAGNOSIS — I4891 Unspecified atrial fibrillation: Secondary | ICD-10-CM | POA: Diagnosis not present

## 2021-03-04 DIAGNOSIS — N1831 Chronic kidney disease, stage 3a: Secondary | ICD-10-CM | POA: Diagnosis not present

## 2021-03-04 DIAGNOSIS — G9340 Encephalopathy, unspecified: Secondary | ICD-10-CM | POA: Diagnosis not present

## 2021-03-05 DIAGNOSIS — R269 Unspecified abnormalities of gait and mobility: Secondary | ICD-10-CM | POA: Diagnosis not present

## 2021-03-05 DIAGNOSIS — I5032 Chronic diastolic (congestive) heart failure: Secondary | ICD-10-CM | POA: Diagnosis not present

## 2021-03-07 DIAGNOSIS — J9620 Acute and chronic respiratory failure, unspecified whether with hypoxia or hypercapnia: Secondary | ICD-10-CM | POA: Diagnosis not present

## 2021-03-07 DIAGNOSIS — J181 Lobar pneumonia, unspecified organism: Secondary | ICD-10-CM | POA: Diagnosis not present

## 2021-03-07 DIAGNOSIS — I5020 Unspecified systolic (congestive) heart failure: Secondary | ICD-10-CM | POA: Diagnosis not present

## 2021-03-07 DIAGNOSIS — I482 Chronic atrial fibrillation, unspecified: Secondary | ICD-10-CM | POA: Diagnosis not present

## 2021-03-15 ENCOUNTER — Telehealth: Payer: Self-pay | Admitting: Internal Medicine

## 2021-03-15 MED ORDER — LOPERAMIDE HCL 1 MG/7.5ML PO SUSP: 2.0000 mg | Freq: Two times a day (BID) | ORAL | 1 refills | Status: DC | PRN

## 2021-03-15 NOTE — Telephone Encounter (Signed)
RX refills

## 2021-03-15 NOTE — Addendum Note (Signed)
Addended by: Tiffany Kocher on: 03/15/2021 04:52 PM   Modules accepted: Orders

## 2021-03-15 NOTE — Telephone Encounter (Signed)
Refill request received for loperamide 2 mg cap. [name].

## 2021-03-15 NOTE — Telephone Encounter (Signed)
Looks like Junious Silk, NP prescribed last. Please have then reach out to that provider.

## 2021-03-15 NOTE — Telephone Encounter (Signed)
Spoke with pt's daughter and she states that the np prescribed that when the pt was in the hospital. Pt doesn't see that np anymore.

## 2021-03-16 ENCOUNTER — Telehealth: Payer: Self-pay | Admitting: Gastroenterology

## 2021-03-16 DIAGNOSIS — M4102 Infantile idiopathic scoliosis, cervical region: Secondary | ICD-10-CM | POA: Diagnosis not present

## 2021-03-16 DIAGNOSIS — R269 Unspecified abnormalities of gait and mobility: Secondary | ICD-10-CM | POA: Diagnosis not present

## 2021-03-16 DIAGNOSIS — I5032 Chronic diastolic (congestive) heart failure: Secondary | ICD-10-CM | POA: Diagnosis not present

## 2021-03-16 DIAGNOSIS — Z930 Tracheostomy status: Secondary | ICD-10-CM | POA: Diagnosis not present

## 2021-03-16 NOTE — Telephone Encounter (Signed)
Agree 

## 2021-03-16 NOTE — Telephone Encounter (Signed)
Spoke with pharmacist Misty Stanley from [facility] and authorized pt's loperamide 1mg  suspension to be changed to pill form in same strength/dosing with permission to give verbal by . Per pt's daughter pt is no longer on feeding tube and is able to take pills again.

## 2021-03-17 DIAGNOSIS — Z930 Tracheostomy status: Secondary | ICD-10-CM | POA: Diagnosis not present

## 2021-03-17 DIAGNOSIS — R269 Unspecified abnormalities of gait and mobility: Secondary | ICD-10-CM | POA: Diagnosis not present

## 2021-03-17 DIAGNOSIS — I5032 Chronic diastolic (congestive) heart failure: Secondary | ICD-10-CM | POA: Diagnosis not present

## 2021-03-17 DIAGNOSIS — M4102 Infantile idiopathic scoliosis, cervical region: Secondary | ICD-10-CM | POA: Diagnosis not present

## 2021-03-18 DIAGNOSIS — M4102 Infantile idiopathic scoliosis, cervical region: Secondary | ICD-10-CM | POA: Diagnosis not present

## 2021-03-18 DIAGNOSIS — R269 Unspecified abnormalities of gait and mobility: Secondary | ICD-10-CM | POA: Diagnosis not present

## 2021-03-18 DIAGNOSIS — Z930 Tracheostomy status: Secondary | ICD-10-CM | POA: Diagnosis not present

## 2021-03-18 DIAGNOSIS — I5032 Chronic diastolic (congestive) heart failure: Secondary | ICD-10-CM | POA: Diagnosis not present

## 2021-03-19 DIAGNOSIS — J9610 Chronic respiratory failure, unspecified whether with hypoxia or hypercapnia: Secondary | ICD-10-CM | POA: Diagnosis not present

## 2021-03-19 DIAGNOSIS — M4102 Infantile idiopathic scoliosis, cervical region: Secondary | ICD-10-CM | POA: Diagnosis not present

## 2021-03-19 DIAGNOSIS — Z930 Tracheostomy status: Secondary | ICD-10-CM | POA: Diagnosis not present

## 2021-03-19 DIAGNOSIS — R269 Unspecified abnormalities of gait and mobility: Secondary | ICD-10-CM | POA: Diagnosis not present

## 2021-03-19 DIAGNOSIS — I5042 Chronic combined systolic (congestive) and diastolic (congestive) heart failure: Secondary | ICD-10-CM | POA: Diagnosis not present

## 2021-03-19 DIAGNOSIS — I5032 Chronic diastolic (congestive) heart failure: Secondary | ICD-10-CM | POA: Diagnosis not present

## 2021-03-19 DIAGNOSIS — Z515 Encounter for palliative care: Secondary | ICD-10-CM | POA: Diagnosis not present

## 2021-03-20 DIAGNOSIS — Z930 Tracheostomy status: Secondary | ICD-10-CM | POA: Diagnosis not present

## 2021-03-20 DIAGNOSIS — R269 Unspecified abnormalities of gait and mobility: Secondary | ICD-10-CM | POA: Diagnosis not present

## 2021-03-20 DIAGNOSIS — I5032 Chronic diastolic (congestive) heart failure: Secondary | ICD-10-CM | POA: Diagnosis not present

## 2021-03-20 DIAGNOSIS — J9610 Chronic respiratory failure, unspecified whether with hypoxia or hypercapnia: Secondary | ICD-10-CM | POA: Diagnosis not present

## 2021-03-20 DIAGNOSIS — M4102 Infantile idiopathic scoliosis, cervical region: Secondary | ICD-10-CM | POA: Diagnosis not present

## 2021-03-24 DIAGNOSIS — G309 Alzheimer's disease, unspecified: Secondary | ICD-10-CM | POA: Diagnosis not present

## 2021-03-24 DIAGNOSIS — Z930 Tracheostomy status: Secondary | ICD-10-CM | POA: Diagnosis not present

## 2021-03-24 DIAGNOSIS — J9611 Chronic respiratory failure with hypoxia: Secondary | ICD-10-CM | POA: Diagnosis not present

## 2021-03-24 DIAGNOSIS — I4821 Permanent atrial fibrillation: Secondary | ICD-10-CM | POA: Diagnosis not present

## 2021-04-02 DIAGNOSIS — N1831 Chronic kidney disease, stage 3a: Secondary | ICD-10-CM | POA: Diagnosis not present

## 2021-04-02 DIAGNOSIS — D638 Anemia in other chronic diseases classified elsewhere: Secondary | ICD-10-CM | POA: Diagnosis not present

## 2021-04-02 DIAGNOSIS — E119 Type 2 diabetes mellitus without complications: Secondary | ICD-10-CM | POA: Diagnosis not present

## 2021-04-02 DIAGNOSIS — I129 Hypertensive chronic kidney disease with stage 1 through stage 4 chronic kidney disease, or unspecified chronic kidney disease: Secondary | ICD-10-CM | POA: Diagnosis not present

## 2021-04-04 DIAGNOSIS — I5032 Chronic diastolic (congestive) heart failure: Secondary | ICD-10-CM | POA: Diagnosis not present

## 2021-04-04 DIAGNOSIS — R269 Unspecified abnormalities of gait and mobility: Secondary | ICD-10-CM | POA: Diagnosis not present

## 2021-04-05 ENCOUNTER — Other Ambulatory Visit: Payer: Self-pay | Admitting: Cardiology

## 2021-04-05 DIAGNOSIS — R5381 Other malaise: Secondary | ICD-10-CM | POA: Diagnosis not present

## 2021-04-05 DIAGNOSIS — I129 Hypertensive chronic kidney disease with stage 1 through stage 4 chronic kidney disease, or unspecified chronic kidney disease: Secondary | ICD-10-CM | POA: Diagnosis not present

## 2021-04-05 DIAGNOSIS — E119 Type 2 diabetes mellitus without complications: Secondary | ICD-10-CM | POA: Diagnosis not present

## 2021-04-05 DIAGNOSIS — I5043 Acute on chronic combined systolic (congestive) and diastolic (congestive) heart failure: Secondary | ICD-10-CM | POA: Diagnosis not present

## 2021-04-05 DIAGNOSIS — J9621 Acute and chronic respiratory failure with hypoxia: Secondary | ICD-10-CM | POA: Diagnosis not present

## 2021-04-05 DIAGNOSIS — I4891 Unspecified atrial fibrillation: Secondary | ICD-10-CM | POA: Diagnosis not present

## 2021-04-05 DIAGNOSIS — J9622 Acute and chronic respiratory failure with hypercapnia: Secondary | ICD-10-CM | POA: Diagnosis not present

## 2021-04-05 DIAGNOSIS — N1831 Chronic kidney disease, stage 3a: Secondary | ICD-10-CM | POA: Diagnosis not present

## 2021-04-05 DIAGNOSIS — Z430 Encounter for attention to tracheostomy: Secondary | ICD-10-CM | POA: Diagnosis not present

## 2021-04-05 DIAGNOSIS — G9340 Encephalopathy, unspecified: Secondary | ICD-10-CM | POA: Diagnosis not present

## 2021-04-06 DIAGNOSIS — I5020 Unspecified systolic (congestive) heart failure: Secondary | ICD-10-CM | POA: Diagnosis not present

## 2021-04-06 DIAGNOSIS — I482 Chronic atrial fibrillation, unspecified: Secondary | ICD-10-CM | POA: Diagnosis not present

## 2021-04-06 DIAGNOSIS — J181 Lobar pneumonia, unspecified organism: Secondary | ICD-10-CM | POA: Diagnosis not present

## 2021-04-06 DIAGNOSIS — J9620 Acute and chronic respiratory failure, unspecified whether with hypoxia or hypercapnia: Secondary | ICD-10-CM | POA: Diagnosis not present

## 2021-04-09 ENCOUNTER — Other Ambulatory Visit (HOSPITAL_COMMUNITY): Payer: Self-pay | Admitting: Family Medicine

## 2021-04-09 DIAGNOSIS — R633 Feeding difficulties, unspecified: Secondary | ICD-10-CM

## 2021-04-12 ENCOUNTER — Other Ambulatory Visit: Payer: Self-pay | Admitting: Gastroenterology

## 2021-04-13 ENCOUNTER — Ambulatory Visit (HOSPITAL_COMMUNITY)
Admission: RE | Admit: 2021-04-13 | Discharge: 2021-04-13 | Disposition: A | Discharge status: Home / Self Care | Payer: PPO | Source: Ambulatory Visit | Attending: Family Medicine | Admitting: Family Medicine

## 2021-04-13 ENCOUNTER — Other Ambulatory Visit: Payer: Self-pay

## 2021-04-13 DIAGNOSIS — Z431 Encounter for attention to gastrostomy: Secondary | ICD-10-CM | POA: Diagnosis not present

## 2021-04-13 DIAGNOSIS — R633 Feeding difficulties, unspecified: Secondary | ICD-10-CM | POA: Insufficient documentation

## 2021-04-13 HISTORY — PX: IR GASTROSTOMY TUBE REMOVAL: IMG5492

## 2021-04-13 LAB — SARS CORONAVIRUS 2 (TAT 6-24 HRS): SARS Coronavirus 2: NEGATIVE

## 2021-04-13 MED ORDER — LIDOCAINE VISCOUS HCL 2 % MT SOLN
OROMUCOSAL | Status: AC
  Filled 2021-04-13: qty 15

## 2021-04-13 NOTE — Procedures (Signed)
Successful bedside removal of intact pull through G-tube. No immediate post procedural complications. Gauze dressing placed over site.  Pt provided with care instructions and verbalized understanding of care.  EBL <5cc

## 2021-04-14 ENCOUNTER — Ambulatory Visit (HOSPITAL_COMMUNITY)
Admission: RE | Admit: 2021-04-14 | Discharge: 2021-04-14 | Disposition: A | Discharge status: Home / Self Care | Payer: PPO | Source: Ambulatory Visit | Attending: Acute Care | Admitting: Acute Care

## 2021-04-14 DIAGNOSIS — Z930 Tracheostomy status: Secondary | ICD-10-CM

## 2021-04-14 DIAGNOSIS — G4733 Obstructive sleep apnea (adult) (pediatric): Secondary | ICD-10-CM | POA: Diagnosis not present

## 2021-04-14 DIAGNOSIS — Z430 Encounter for attention to tracheostomy: Secondary | ICD-10-CM | POA: Diagnosis not present

## 2021-04-14 DIAGNOSIS — E662 Morbid (severe) obesity with alveolar hypoventilation: Secondary | ICD-10-CM | POA: Insufficient documentation

## 2021-04-14 NOTE — Progress Notes (Addendum)
Tracheostomy Procedure Note  [name] [id number] 05/16/1951  Pre Procedure Tracheostomy Information  Trach Brand: Shiley Size:  8.0 Style: Cuffed Secured by: Velcro   Procedure: Trach change and Trach cleaning    Post Procedure Tracheostomy Information  Trach Brand: Shiley Size:  8.0 Style: Cuffed Secured by: Velcro   Post Procedure Evaluation:  ETCO2 positive color change from yellow to purple : Yes.   Vital signs:VSS on trach collar with 2 lpm Patients current condition: stable Complications: No apparent complications Trach site exam: clean, dry Wound care done: 4 x 4 gauze drain gauge Patient did tolerate procedure well. Cuffed on trach checked prior to insertion  Education: none  Prescription needs: none    Additional needs: And additional #8 cuffed trach given to daughter.  No PMV given to patient at this visit

## 2021-04-14 NOTE — Progress Notes (Cosign Needed)
[facility]   Reason for visit:  Trach change  HPI:  This is a 70 female patient whom I know well after a prolonged hospital stay this summer when she was admitted for acute on chronic hypoxic and hypercarbic respiratory failure which was primarily the result of untreated restrictive lung disease from kyphoscoliosis and mixed breathing disorder including OHS and OSA.  She was also treated for pneumonia during that stay as well as volume overload but the overwhelming driving factor was her restrictive lung disease leading to necessitate tracheostomy.  Her hospital stay was prolonged from several complicating factors including fairly severe delirium, deconditioning, hemoptysis with lung collapse from mucous plugging, and dysphagia requiring PEG placement.  She was finally discharged to home under the care of her family, and she is really thrived since that time.  She presents today for tracheostomy change ROS  General: No fever, no chills.  No recent sick exposures.  Recently had PEG tube removed.  HEENT no headache, sinus congestion, tracheal site discomfort or drainage.  No sore throat.  Pulmonary: No fever, shortness of breath, cough, wheezing, chest discomfort, no increased tracheal discharge.  Cardiac: No chest pain no palpitations no orthopnea abdomen: Tolerating diet no nausea vomiting extremities good strength no focal weakness no complaints GU no complaints neuro no complaints actually improved quite a bit cognitively, daughter also reports no further hallucination, daytime drowsiness resolved.  Sleeping well on ventilator. Vital signs:  Reviewed: Pulse Ox 90s on humidified oxygen Exam:  General this is a very pleasant 70 year old white female she is sitting up in a wheelchair, she is awake, cooperative, she is in no acute distress HEENT normocephalic atraumatic a size 6 cuffed tracheostomy is in place.  The cuff is deflated, she is able to phonate with PMV in occlusion.   The tracheostomy stoma is unremarkable Cardiac: Regular rate and rhythm without murmur rub or gallop Abdomen soft nontender no organomegaly Extremities: Warm and dry Neuro: Awake oriented no focal deficits.  Trach change/procedure:  The existing tracheostomy was removed after premedicated with inhaled lidocaine.  After that sterile lidocaine jelly was applied to the tracheostomy site after the existing tracheostomy was removed, the site was inspected it was unremarkable, there was no evidence of inflammation or infection.  A new size 6 cuffed tracheostomy was placed over obturator without difficulty end-tidal CO2 confirmed placement patient tolerated well      Impression/dx  Chronic hypoxic respiratory failure secondary to restrictive lung disease from severe OHS/OSA and kyphoscoliosis Tracheostomy dependence HFpEF Discussion  Esperanza is thriving at home.  She presents today for planned tracheostomy change.  She is actually had her change done at home twice once by home health nurse and once by her daughter.  She is doing quite well with PMV, tolerating mechanical ventilation at night without issue, and overall her quality of life is improved dramatically. Plan  Continue routine tracheostomy change Continue nocturnal ventilation I have plan with her daughter that I will see her every 8 weeks.  We will still continue to change her tracheostomy every 4 given that it is a cuffed tracheostomy, her daughter will change it at week 4 and I will change it on every other month schedule   Visit time: 32 minutes.   Simonne Martinet ACNP-BC [facility] Pulmonary/Critical Care

## 2021-04-15 DIAGNOSIS — R269 Unspecified abnormalities of gait and mobility: Secondary | ICD-10-CM | POA: Diagnosis not present

## 2021-04-15 DIAGNOSIS — I5032 Chronic diastolic (congestive) heart failure: Secondary | ICD-10-CM | POA: Diagnosis not present

## 2021-04-15 DIAGNOSIS — M4102 Infantile idiopathic scoliosis, cervical region: Secondary | ICD-10-CM | POA: Diagnosis not present

## 2021-04-15 DIAGNOSIS — Z930 Tracheostomy status: Secondary | ICD-10-CM | POA: Diagnosis not present

## 2021-04-16 DIAGNOSIS — Z515 Encounter for palliative care: Secondary | ICD-10-CM | POA: Diagnosis not present

## 2021-04-16 DIAGNOSIS — I5042 Chronic combined systolic (congestive) and diastolic (congestive) heart failure: Secondary | ICD-10-CM | POA: Diagnosis not present

## 2021-04-16 DIAGNOSIS — J9610 Chronic respiratory failure, unspecified whether with hypoxia or hypercapnia: Secondary | ICD-10-CM | POA: Diagnosis not present

## 2021-04-17 DIAGNOSIS — R269 Unspecified abnormalities of gait and mobility: Secondary | ICD-10-CM | POA: Diagnosis not present

## 2021-04-17 DIAGNOSIS — Z930 Tracheostomy status: Secondary | ICD-10-CM | POA: Diagnosis not present

## 2021-04-17 DIAGNOSIS — I5032 Chronic diastolic (congestive) heart failure: Secondary | ICD-10-CM | POA: Diagnosis not present

## 2021-04-17 DIAGNOSIS — M4102 Infantile idiopathic scoliosis, cervical region: Secondary | ICD-10-CM | POA: Diagnosis not present

## 2021-04-18 DIAGNOSIS — I5032 Chronic diastolic (congestive) heart failure: Secondary | ICD-10-CM | POA: Diagnosis not present

## 2021-04-18 DIAGNOSIS — R269 Unspecified abnormalities of gait and mobility: Secondary | ICD-10-CM | POA: Diagnosis not present

## 2021-04-18 DIAGNOSIS — M4102 Infantile idiopathic scoliosis, cervical region: Secondary | ICD-10-CM | POA: Diagnosis not present

## 2021-04-18 DIAGNOSIS — Z930 Tracheostomy status: Secondary | ICD-10-CM | POA: Diagnosis not present

## 2021-04-19 DIAGNOSIS — R269 Unspecified abnormalities of gait and mobility: Secondary | ICD-10-CM | POA: Diagnosis not present

## 2021-04-19 DIAGNOSIS — M4102 Infantile idiopathic scoliosis, cervical region: Secondary | ICD-10-CM | POA: Diagnosis not present

## 2021-04-19 DIAGNOSIS — Z930 Tracheostomy status: Secondary | ICD-10-CM | POA: Diagnosis not present

## 2021-04-19 DIAGNOSIS — J9610 Chronic respiratory failure, unspecified whether with hypoxia or hypercapnia: Secondary | ICD-10-CM | POA: Diagnosis not present

## 2021-04-19 DIAGNOSIS — I5032 Chronic diastolic (congestive) heart failure: Secondary | ICD-10-CM | POA: Diagnosis not present

## 2021-04-23 DIAGNOSIS — M4102 Infantile idiopathic scoliosis, cervical region: Secondary | ICD-10-CM | POA: Diagnosis not present

## 2021-04-23 DIAGNOSIS — R269 Unspecified abnormalities of gait and mobility: Secondary | ICD-10-CM | POA: Diagnosis not present

## 2021-04-23 DIAGNOSIS — I5032 Chronic diastolic (congestive) heart failure: Secondary | ICD-10-CM | POA: Diagnosis not present

## 2021-04-23 DIAGNOSIS — Z930 Tracheostomy status: Secondary | ICD-10-CM | POA: Diagnosis not present

## 2021-05-02 ENCOUNTER — Other Ambulatory Visit: Payer: Self-pay | Admitting: Nurse Practitioner

## 2021-05-03 NOTE — Telephone Encounter (Signed)
Spoke to pt's daughter [name] ([place]). She said that pt is still taking dicyclomine.

## 2021-05-03 NOTE — Telephone Encounter (Signed)
Noted  

## 2021-05-03 NOTE — Telephone Encounter (Signed)
Sending in refill.   [name]: Please arrange follow-up with Dr. Marletta Lor. Hasn't been seen since December 2021.

## 2021-05-03 NOTE — Telephone Encounter (Signed)
Looks like this dicyclomine was discontinued during hospitalization in March. Reasoning not entirely clear.  Toni Amend, please find out if patient is still taking dicyclomine and needing a refill or if her symptoms are controlled with Imodium.

## 2021-05-04 ENCOUNTER — Encounter: Payer: Self-pay | Admitting: Internal Medicine

## 2021-05-04 DIAGNOSIS — R5381 Other malaise: Secondary | ICD-10-CM | POA: Diagnosis not present

## 2021-05-04 DIAGNOSIS — J9621 Acute and chronic respiratory failure with hypoxia: Secondary | ICD-10-CM | POA: Diagnosis not present

## 2021-05-04 DIAGNOSIS — I4891 Unspecified atrial fibrillation: Secondary | ICD-10-CM | POA: Diagnosis not present

## 2021-05-04 DIAGNOSIS — Z430 Encounter for attention to tracheostomy: Secondary | ICD-10-CM | POA: Diagnosis not present

## 2021-05-04 DIAGNOSIS — G9340 Encephalopathy, unspecified: Secondary | ICD-10-CM | POA: Diagnosis not present

## 2021-05-04 DIAGNOSIS — I5043 Acute on chronic combined systolic (congestive) and diastolic (congestive) heart failure: Secondary | ICD-10-CM | POA: Diagnosis not present

## 2021-05-04 DIAGNOSIS — E119 Type 2 diabetes mellitus without complications: Secondary | ICD-10-CM | POA: Diagnosis not present

## 2021-05-04 DIAGNOSIS — N1831 Chronic kidney disease, stage 3a: Secondary | ICD-10-CM | POA: Diagnosis not present

## 2021-05-04 DIAGNOSIS — I129 Hypertensive chronic kidney disease with stage 1 through stage 4 chronic kidney disease, or unspecified chronic kidney disease: Secondary | ICD-10-CM | POA: Diagnosis not present

## 2021-05-04 DIAGNOSIS — J9622 Acute and chronic respiratory failure with hypercapnia: Secondary | ICD-10-CM | POA: Diagnosis not present

## 2021-05-05 DIAGNOSIS — E1165 Type 2 diabetes mellitus with hyperglycemia: Secondary | ICD-10-CM | POA: Diagnosis not present

## 2021-05-05 DIAGNOSIS — Z7189 Other specified counseling: Secondary | ICD-10-CM | POA: Diagnosis not present

## 2021-05-05 DIAGNOSIS — J9611 Chronic respiratory failure with hypoxia: Secondary | ICD-10-CM | POA: Diagnosis not present

## 2021-05-05 DIAGNOSIS — Z23 Encounter for immunization: Secondary | ICD-10-CM | POA: Diagnosis not present

## 2021-05-07 DIAGNOSIS — I482 Chronic atrial fibrillation, unspecified: Secondary | ICD-10-CM | POA: Diagnosis not present

## 2021-05-07 DIAGNOSIS — J181 Lobar pneumonia, unspecified organism: Secondary | ICD-10-CM | POA: Diagnosis not present

## 2021-05-07 DIAGNOSIS — J9620 Acute and chronic respiratory failure, unspecified whether with hypoxia or hypercapnia: Secondary | ICD-10-CM | POA: Diagnosis not present

## 2021-05-07 DIAGNOSIS — I5020 Unspecified systolic (congestive) heart failure: Secondary | ICD-10-CM | POA: Diagnosis not present

## 2021-05-12 ENCOUNTER — Encounter: Payer: Self-pay | Admitting: Internal Medicine

## 2021-05-12 DIAGNOSIS — D509 Iron deficiency anemia, unspecified: Secondary | ICD-10-CM | POA: Diagnosis not present

## 2021-05-12 DIAGNOSIS — R5383 Other fatigue: Secondary | ICD-10-CM | POA: Diagnosis not present

## 2021-05-12 DIAGNOSIS — D649 Anemia, unspecified: Secondary | ICD-10-CM | POA: Diagnosis not present

## 2021-05-12 DIAGNOSIS — E611 Iron deficiency: Secondary | ICD-10-CM | POA: Diagnosis not present

## 2021-05-14 ENCOUNTER — Institutional Professional Consult (permissible substitution): Payer: PPO | Admitting: Internal Medicine

## 2021-05-16 DIAGNOSIS — M4102 Infantile idiopathic scoliosis, cervical region: Secondary | ICD-10-CM | POA: Diagnosis not present

## 2021-05-16 DIAGNOSIS — Z930 Tracheostomy status: Secondary | ICD-10-CM | POA: Diagnosis not present

## 2021-05-16 DIAGNOSIS — R269 Unspecified abnormalities of gait and mobility: Secondary | ICD-10-CM | POA: Diagnosis not present

## 2021-05-16 DIAGNOSIS — I5032 Chronic diastolic (congestive) heart failure: Secondary | ICD-10-CM | POA: Diagnosis not present

## 2021-05-17 DIAGNOSIS — I5042 Chronic combined systolic (congestive) and diastolic (congestive) heart failure: Secondary | ICD-10-CM | POA: Diagnosis not present

## 2021-05-17 DIAGNOSIS — J9610 Chronic respiratory failure, unspecified whether with hypoxia or hypercapnia: Secondary | ICD-10-CM | POA: Diagnosis not present

## 2021-05-17 DIAGNOSIS — Z515 Encounter for palliative care: Secondary | ICD-10-CM | POA: Diagnosis not present

## 2021-05-18 DIAGNOSIS — M4102 Infantile idiopathic scoliosis, cervical region: Secondary | ICD-10-CM | POA: Diagnosis not present

## 2021-05-18 DIAGNOSIS — I5032 Chronic diastolic (congestive) heart failure: Secondary | ICD-10-CM | POA: Diagnosis not present

## 2021-05-18 DIAGNOSIS — R269 Unspecified abnormalities of gait and mobility: Secondary | ICD-10-CM | POA: Diagnosis not present

## 2021-05-18 DIAGNOSIS — Z930 Tracheostomy status: Secondary | ICD-10-CM | POA: Diagnosis not present

## 2021-05-19 DIAGNOSIS — Z930 Tracheostomy status: Secondary | ICD-10-CM | POA: Diagnosis not present

## 2021-05-19 DIAGNOSIS — R269 Unspecified abnormalities of gait and mobility: Secondary | ICD-10-CM | POA: Diagnosis not present

## 2021-05-19 DIAGNOSIS — I5032 Chronic diastolic (congestive) heart failure: Secondary | ICD-10-CM | POA: Diagnosis not present

## 2021-05-19 DIAGNOSIS — M4102 Infantile idiopathic scoliosis, cervical region: Secondary | ICD-10-CM | POA: Diagnosis not present

## 2021-05-20 ENCOUNTER — Other Ambulatory Visit: Payer: Self-pay

## 2021-05-20 ENCOUNTER — Ambulatory Visit (HOSPITAL_COMMUNITY)
Admission: RE | Admit: 2021-05-20 | Discharge: 2021-05-20 | Disposition: A | Discharge status: Home / Self Care | Payer: PPO | Source: Ambulatory Visit | Attending: Acute Care | Admitting: Acute Care

## 2021-05-20 DIAGNOSIS — J9611 Chronic respiratory failure with hypoxia: Secondary | ICD-10-CM | POA: Diagnosis not present

## 2021-05-20 DIAGNOSIS — R052 Subacute cough: Secondary | ICD-10-CM | POA: Diagnosis not present

## 2021-05-20 DIAGNOSIS — Z430 Encounter for attention to tracheostomy: Secondary | ICD-10-CM | POA: Insufficient documentation

## 2021-05-20 DIAGNOSIS — R059 Cough, unspecified: Secondary | ICD-10-CM | POA: Diagnosis not present

## 2021-05-20 DIAGNOSIS — I5032 Chronic diastolic (congestive) heart failure: Secondary | ICD-10-CM | POA: Diagnosis not present

## 2021-05-20 DIAGNOSIS — Z930 Tracheostomy status: Secondary | ICD-10-CM | POA: Diagnosis not present

## 2021-05-20 DIAGNOSIS — Z9911 Dependence on respirator [ventilator] status: Secondary | ICD-10-CM | POA: Insufficient documentation

## 2021-05-20 DIAGNOSIS — M4102 Infantile idiopathic scoliosis, cervical region: Secondary | ICD-10-CM | POA: Diagnosis not present

## 2021-05-20 DIAGNOSIS — E662 Morbid (severe) obesity with alveolar hypoventilation: Secondary | ICD-10-CM | POA: Diagnosis not present

## 2021-05-20 DIAGNOSIS — J40 Bronchitis, not specified as acute or chronic: Secondary | ICD-10-CM

## 2021-05-20 DIAGNOSIS — I271 Kyphoscoliotic heart disease: Secondary | ICD-10-CM | POA: Insufficient documentation

## 2021-05-20 DIAGNOSIS — R269 Unspecified abnormalities of gait and mobility: Secondary | ICD-10-CM | POA: Diagnosis not present

## 2021-05-20 DIAGNOSIS — G4733 Obstructive sleep apnea (adult) (pediatric): Secondary | ICD-10-CM | POA: Diagnosis not present

## 2021-05-20 DIAGNOSIS — J9610 Chronic respiratory failure, unspecified whether with hypoxia or hypercapnia: Secondary | ICD-10-CM | POA: Diagnosis not present

## 2021-05-20 DIAGNOSIS — J984 Other disorders of lung: Secondary | ICD-10-CM | POA: Diagnosis not present

## 2021-05-20 NOTE — Progress Notes (Signed)
[facility]   Reason for visit:  Trach change  HPI:  This is a 32 female patient whom I know well after a prolonged hospital stay this summer when she was admitted for acute on chronic hypoxic and hypercarbic respiratory failure which was primarily the result of untreated restrictive lung disease from kyphoscoliosis and mixed breathing disorder including OHS and OSA.  She was also treated for pneumonia during that stay as well as volume overload but the overwhelming driving factor was her restrictive lung disease leading to necessitate tracheostomy.    She is now cared for at home by her daughter. She remains Nocturnal vent dependent. She is here today for 4 week trach change. Most recently has been treated by home care MD for respiratory infection-->completed augmentin and doxy still on pred. Still has cough. Productive at times. No fever. Overall improved but was concerned about on-going cough  ROS  Review of Systems  Constitutional:  Negative for chills, fever, malaise/fatigue and weight loss.  HENT:  Negative for congestion.   Eyes: Negative.   Respiratory:  Positive for cough, sputum production and shortness of breath.   Cardiovascular:  Negative for chest pain, palpitations and leg swelling.  Gastrointestinal: Negative.   Genitourinary: Negative.   Musculoskeletal: Negative.   Skin: Negative.   Neurological: Negative.   Endo/Heme/Allergies: Negative.   Psychiatric/Behavioral: Negative.     Vital signs:  Reviewed Pulse ox mid 90s  Exam:  General this is a 70 year old female she is in [facility]. Not distressed.  HENT NCAT. She has thin secretions from the cuffed 8 trach. W/ rhonchus UAW sounds Pulm scattered rhonchi  Card rrr Abd soft Ext dependent edema Neuro awake and alert Trach change/procedure:  The patient was administered inhaled lidocaine and following that using bronchoscopy the trach to level of carina was evaluated. There were to notable areas of  irritation at the carina leading into the RMSB. She had copious blood tinged secretions. I did minimal lavage and collected for culture, the trach site and surrounding trachea area was unremarkable After the bronchoscope was removed The trach was removed and new 8 cuffed trach was placed using obturator. Pt tolerated well and placement was confirmed using ETCO2      Impression/dx  Chronic hypoxic respiratory failure secondary to restrictive lung disease from severe OHS/OSA and kyphoscoliosis Tracheostomy dependence HFpEF Recent URI w/ on-going cough  Discussion  Ms Larmon is doing well trach stand-point. Still has cough and given trach status and on-going symptoms I felt it was necessary to culture to be sure no infection. She did have some area of irritation at the entrance of the RMSB so I have instructed her to hold her DOAC tonight and can resume again in am  Plan  Cont routine trach care ROV 4 weeks F/u culture data   My time 45 minutes   Simonne Martinet ACNP-BC [facility] Pulmonary/Critical Care

## 2021-05-20 NOTE — Progress Notes (Addendum)
Tracheostomy Procedure Note  [name] [id number] 27-Nov-1950  Pre Procedure Tracheostomy Information  Trach Brand: Shiley Size:  8.0  flex shiley   Style: Cuffed Secured by: Velcro   Procedure: Trach Change/ Trach cleaning and Bedside Bronchoscopy    Post Procedure Tracheostomy Information  Trach Brand: Shiley Size:  8.0  flex Style: Cuffed Secured by: Velcro   Post Procedure Evaluation:  ETCO2 positive color change from yellow to purple : Yes.   Vital signs:VSS Patients current condition: stable Complications: No apparent complications Trach site exam: clean, dry Wound care done: 4 x 4 gauze drain Patient did tolerate procedure well. Cuff checked prior to trach change Bronchoscopy performed today and sputum sample sent to lab Patient given back  her PMV  Education: none  Prescription needs: none    Additional needs: none

## 2021-05-22 LAB — CULTURE, RESPIRATORY W GRAM STAIN
Culture: NORMAL
Special Requests: NORMAL

## 2021-05-24 ENCOUNTER — Encounter: Payer: Self-pay | Admitting: Nurse Practitioner

## 2021-05-24 ENCOUNTER — Other Ambulatory Visit: Payer: Self-pay

## 2021-05-24 ENCOUNTER — Ambulatory Visit: Payer: PPO | Admitting: Nurse Practitioner

## 2021-05-24 VITALS — BP 134/74 | HR 53 | Ht 66.0 in | Wt 195.2 lb

## 2021-05-24 DIAGNOSIS — E782 Mixed hyperlipidemia: Secondary | ICD-10-CM

## 2021-05-24 DIAGNOSIS — I10 Essential (primary) hypertension: Secondary | ICD-10-CM

## 2021-05-24 DIAGNOSIS — I2721 Secondary pulmonary arterial hypertension: Secondary | ICD-10-CM

## 2021-05-24 DIAGNOSIS — I5042 Chronic combined systolic (congestive) and diastolic (congestive) heart failure: Secondary | ICD-10-CM | POA: Diagnosis not present

## 2021-05-24 DIAGNOSIS — I4811 Longstanding persistent atrial fibrillation: Secondary | ICD-10-CM

## 2021-05-24 NOTE — Patient Instructions (Signed)
Medication Instructions:  Your physician recommends that you continue on your current medications as directed. Please refer to the Current Medication list given to you today.   Labwork: None today  Testing/Procedures: None today  Follow-Up: 3 months with Dr.Branch  Any Other Special Instructions Will Be Listed Below (If Applicable).  If you need a refill on your cardiac medications before your next appointment, please call your pharmacy.  

## 2021-05-24 NOTE — Progress Notes (Signed)
Office Visit    Patient Name: [name] Date of Encounter: 05/24/2021  Primary Care Provider:  Lemmie Evens, MD Primary Cardiologist:  Carlyle Dolly, MD  Chief Complaint    70 year old female with a history of chronic combined systolic and diastolic congestive heart failure, pulmonary hypertension, diabetes, hypertension, hyperlipidemia, hypothyroidism, persistent atrial fibrillation on amiodarone and Eliquis, chronic respiratory failure status post trach, obstructive sleep apnea anemia, seizures, obesity, dementia, and irritable bowel syndrome, who presents for follow-up of A. fib and heart failure.  Past Medical History    Past Medical History:  Diagnosis Date   Abnormal pulmonary function test    Anemia    H/H of 10/30 with a normal MCV in 12/09   Anxiety    Arthritis    Barrett's esophagus    Diagnosed 1995. Last EGD 2016-NO BARRETT'S.    Chest pain    a. 2022 Cath: nl cors; b. 2008 Neg myoview; c. 01/2014 Cath: Nl cors. EF 40%; c. 08/2020 NSTEMI/Cath [facility]): nl cors.   Chronic anticoagulation    Chronic combined systolic and diastolic CHF (congestive heart failure) ([place])    a. 10/2014 Echo: EF40%; b. 01/2015 Echo: EF 55-60%; c. 11/2020 Echo: EF 55-60%, mod LVH, sev BAE, mild MR, mod TR. PASP 89mmHg; d. 01/2021 Echo: EF 45-50%, glob HK, mod conc LVH, mod reduced RV fxn, RVSP 60.44mmHg, Mod TR, mild to mod MR, sev dil LA.   Chronic LBP    Surgical intervention in 1996   Diabetes mellitus, type 2 ([place])    Insulin therapy; exacerbated by prednisone   Gastroparesis    99% retention 05/2008 on GES   GERD (gastroesophageal reflux disease)    Heart attack ([place]) 08/13/2020   Hiatal hernia    Hyperlipidemia    Hypertension    Hypothyroid    IBS (irritable bowel syndrome)    Obesity    OSA on CPAP    had CPAP and cannot tolerate.   Paroxysmal atrial fibrillation (HCC)    a.CHA2DS2VASc = 6-->eliquis. Also on Amio.   Pulmonary hypertension ([place]) 01/2015   a. 01/2015 RHC:  Predominantly pulmonary venous hypertension but may be component of PAH (PA mean 52, PCWP 31); b. 01/2021 Echo: RVSP 60.78mmHg w/ mod reduced RV fxn.   Seizures ([place])    last seizure was 2 years ago; on keppra for this; unknown etiology   Syncope    a. Admitted 05/2009; magnetic resonance imagin/ MRA - negative; etiology thought to be orthostasis secondary to drugs and dehydration. b. Syncope 02/2015 also felt 2/2 dehydration.   Past Surgical History:  Procedure Laterality Date   BACK SURGERY  1996   BIOPSY N/A 11/08/2013   Procedure: BIOPSY  / Tissue sampling / ulcers present in small intestine;  Surgeon: Danie Binder, MD;  [facility];  Service: Endoscopy;  Laterality: N/A;   CARDIAC CATHETERIZATION  2002   CARDIAC CATHETERIZATION N/A 01/26/2015   Procedure: Right Heart Cath;  Surgeon: Larey Dresser, MD;  Location: [place] CV LAB;  Service: Cardiovascular;  Laterality: N/A;   CARDIOVASCULAR STRESS TEST  2008   Stress nuclear study   CARDIOVERSION N/A 03/06/2015   Procedure: CARDIOVERSION;  Surgeon: Larey Dresser, MD;  Location: [place];  Service: Cardiovascular;  Laterality: N/A;   CARPAL TUNNEL RELEASE  1994   COLONOSCOPY  11/2011   Dr. Oneida Alar: Internal hemorrhoids, mild diverticulosis. Random colon biopsies negative.   COLONOSCOPY N/A 11/08/2013   SLF: Normal mucosa in the terminal  ileum/The colon IS redundant/  Moderate diverticulosis throughout the entire colon. ileum bx benign. colon bx benign   CRYOTHERAPY  01/23/2021   Procedure: CRYOTHERAPY;  Surgeon: Lorin Glass, MD;  Location: [facility] ENDOSCOPY;  Service: Pulmonary;;   CRYOTHERAPY  01/24/2021   Procedure: Cloyde Reams;  Surgeon: Lorin Glass, MD;  Location: [facility] ENDOSCOPY;  Service: Pulmonary;;   CRYOTHERAPY  01/26/2021   Procedure: Cloyde Reams;  Surgeon: Lupita Leash, MD;  Location: [facility] ENDOSCOPY;  Service: Cardiopulmonary;;   ESOPHAGOGASTRODUODENOSCOPY  2008   Barrett's without dysplasia. esphagus dilated.  antral erosions, h.pylori serologies negative.   ESOPHAGOGASTRODUODENOSCOPY  11/2011   Dr. Darrick Penna: Barrett's esophagus, mild gastritis, diverticulum in the second portion of the duodenum repeat EGD 3 years. Small bowel biopsies negative. Gastric biopsy show reactive gastropathy but no H. pylori. Esophageal biopsies consistent with GERD. Next EGD 11/2014   ESOPHAGOGASTRODUODENOSCOPY N/A 11/21/2014   DZH:GDJM non-erosive gastritis/irregular z-line   FOREIGN BODY REMOVAL  01/23/2021   Procedure: FOREIGN BODY REMOVAL;  Surgeon: Lorin Glass, MD;  Location: [facility] ENDOSCOPY;  Service: Pulmonary;;   FOREIGN BODY REMOVAL  01/24/2021   Procedure: FOREIGN BODY REMOVAL;  Surgeon: Lorin Glass, MD;  Location: [facility] ENDOSCOPY;  Service: Pulmonary;;   GIVENS CAPSULE STUDY  12/07/2011   Proximal small bowel, rare AVM. Distal small bowel, multiple ulcers noted   GIVENS CAPSULE STUDY N/A 09/27/2013   Distal small bowel ulcers extending to TI.   GIVENS CAPSULE STUDY N/A 10/10/2013   Procedure: GIVENS CAPSULE STUDY;  Surgeon: West Bali, MD;  [facility];  Service: Endoscopy;  Laterality: N/A;  7:30   HEMOSTASIS CONTROL  01/24/2021   Procedure: HEMOSTASIS CONTROL;  Surgeon: Lorin Glass, MD;  Location: [facility] ENDOSCOPY;  Service: Pulmonary;;   HEMOSTASIS CONTROL  01/26/2021   Procedure: HEMOSTASIS CONTROL;  Surgeon: Lupita Leash, MD;  Location: [facility] ENDOSCOPY;  Service: Cardiopulmonary;;   IR GASTROSTOMY TUBE MOD SED  01/11/2021   IR GASTROSTOMY TUBE REMOVAL  04/13/2021   IR KYPHO THORACIC WITH BONE BIOPSY  02/09/2018   KNEE ARTHROSCOPY WITH MEDIAL MENISECTOMY Right 06/09/2016   Procedure: KNEE ARTHROSCOPY WITH MEDIAL MENISECTOMY;  Surgeon: Vickki Hearing, MD;  Location: [facility];  Service: Orthopedics;  Laterality: Right;  medial and lateral menisectomy   LAMINECTOMY  1995   L4-L5   LAPAROSCOPIC CHOLECYSTECTOMY  1990s   LEFT HEART CATHETERIZATION WITH CORONARY ANGIOGRAM  01/10/2014   Procedure: LEFT  HEART CATHETERIZATION WITH CORONARY ANGIOGRAM;  Surgeon: Lesleigh Noe, MD;  Location: [facility] CATH LAB;  Service: Cardiovascular;;   RIGHT HEART CATHETERIZATION N/A 01/10/2014   Procedure: RIGHT HEART CATH;  Surgeon: Lesleigh Noe, MD;  Location: [facility] CATH LAB;  Service: Cardiovascular;  Laterality: N/A;   TOTAL ABDOMINAL HYSTERECTOMY  1999   TRACHEOSTOMY TUBE PLACEMENT N/A 09/01/2017   Procedure: TRACHEOSTOMY;  Surgeon: Drema Halon, MD;  [facility] OR;  Service: ENT;  Laterality: N/A;   VIDEO BRONCHOSCOPY Bilateral 01/23/2021   Procedure: VIDEO BRONCHOSCOPY WITHOUT FLUORO;  Surgeon: Lorin Glass, MD;  Location: [facility] ENDOSCOPY;  Service: Pulmonary;  Laterality: Bilateral;   VIDEO BRONCHOSCOPY Bilateral 01/24/2021   Procedure: VIDEO BRONCHOSCOPY WITHOUT FLUORO;  Surgeon: Lorin Glass, MD;  Location: [facility] ENDOSCOPY;  Service: Pulmonary;  Laterality: Bilateral;   VIDEO BRONCHOSCOPY N/A 01/26/2021   Procedure: VIDEO BRONCHOSCOPY WITHOUT FLUORO;  Surgeon: Lupita Leash, MD;  Location: [facility] ENDOSCOPY;  Service: Cardiopulmonary;  Laterality: N/A;    Allergies  Allergies  Allergen  Reactions   Citalopram Hydrobromide Other (See Comments)    Dyskinesia Other reaction(s): Other (See Comments) Dyskinesia   Codeine Nausea And Vomiting and Other (See Comments)    HALLUCINATIONS Other reaction(s): Unknown Other reaction(s): Other (See Comments) HALLUCINATIONS    Hydromorphone Hcl Other (See Comments)    Made her pass out Other reaction(s): Other (See Comments) Made her pass out   Metoclopramide Other (See Comments)    DYSKINESIA Other reaction(s): Other (See Comments) DYSKINESIA   Hyoscyamine Other (See Comments)    MADE DIARRHEA WORSE    History of Present Illness    70 year old female with a history of chronic combined systolic and diastolic congestive heart failure, pulmonary hypertension, diabetes, hypertension, hyperlipidemia, hypothyroidism, persistent atrial  fibrillation on amiodarone and Eliquis, chronic respiratory failure status post trach, obstructive sleep apnea anemia, seizures, obesity, dementia, and irritable bowel syndrome.  Cardiac history dates back to at least 2002, when she underwent diagnostic catheterization in the setting of chest pain, which did not show any obstructive coronary disease.  She subsequently underwent stress testing in 2008, which was nonischemic.  In 2015, she underwent diagnostic catheterization in the setting of chest pain and mild to moderate LV dysfunction with an EF of 45 to 50%.  Catheterization showed normal coronary arteries.  She has been followed over the years for combined systolic and diastolic heart failure as well as pulmonary hypertension, with right heart catheterization in July 2016 showing predominantly pulmonary venous hypertension with possible component of PAH.  Mean PA systolic pressure at that time was 52 mmHg while her wedge was 31.  She required admission for IV diuresis at that time.  In February 2022, she was admitted to [facility] with respiratory failure and non-STEMI.  Catheterization again showed normal coronary arteries.  In June 2022, she was admitted with worsening dyspnea and volume overload.  She required intubation and vasopressor therapy.  Following extubation, she developed A. fib with RVR.  She was rate controlled with oral amiodarone and continued on Eliquis.  She was readmitted in July for prolonged hospital stay in the setting of respiratory failure.  Echocardiogram in early July showed an EF of 45 to 50% with global hypokinesis, moderate concentric LVH, moderately reduced RV function, moderate TR, an RVSP of 60.1 mmHg, and mild to moderate MR.  She has not been seen since hospital discharge in June.  Ms. Meccia remains on a trach collar w/ ventilator at night.  She notes that for the most part, she has done quite well since her hospitalization.  She did recently have a respiratory infection that  required antibiotic therapy but she notes that she has recovered well.  Her daughter is present with her today.  Patient says that she knows when she is in A. fib and occasionally notes palpitations, usually about once a week, lasting 3 to 5 minutes, resolving spontaneously.  However, she is in atrial fibrillation today and is completely asymptomatic with a rate of 63 bpm.  She has chronic dyspnea on exertion and sometimes finds it challenging to work with physical therapy but notes that they have been very patient with her and her strength has been steadily improving.  She denies chest pain, PND, orthopnea, dizziness, syncope, edema, or early satiety.  Home Medications    Current Outpatient Medications  Medication Sig Dispense Refill   acetaminophen (TYLENOL) 500 MG tablet Place 1 tablet (500 mg total) into feeding tube as needed for moderate pain or headache. 30 tablet 0   amiodarone (PACERONE) 200  MG tablet Place 1 tablet (200 mg total) into feeding tube daily. 30 tablet 9   apixaban (ELIQUIS) 5 MG TABS tablet Place 1 tablet (5 mg total) into feeding tube 2 (two) times daily. 60 tablet 11   atorvastatin (LIPITOR) 80 MG tablet Place 1 tablet (80 mg total) into feeding tube daily. 30 tablet 3   dicyclomine (BENTYL) 20 MG tablet TAKE 1 TABLET BY MOUTH UP TO THREE TIMES DAILY BEFORE MEALS AS NEEDED. 90 tablet 2   donepezil (ARICEPT ODT) 5 MG disintegrating tablet Place 1 tablet (5 mg total) into feeding tube at bedtime. 30 tablet 3   DULoxetine (CYMBALTA) 30 MG capsule Take 1 capsule (30 mg total) by mouth 2 (two) times daily. 30 capsule 3   feeding supplement (ENSURE ENLIVE / ENSURE PLUS) LIQD Take 237 mLs by mouth 2 (two) times daily between meals. 237 mL 12   fiber (NUTRISOURCE FIBER) PACK packet Place 1 packet into feeding tube 2 (two) times daily. 60 packet 1   furosemide (LASIX) 40 MG tablet Place 1 tablet (40 mg total) into feeding tube daily. 30 tablet 3   gabapentin (NEURONTIN) 100 MG capsule  Take 1 capsule (100 mg total) by mouth 3 (three) times daily. 90 capsule 3   insulin glargine (LANTUS) 100 UNIT/ML injection Inject 0.2 mLs (20 Units total) into the skin daily.     ipratropium-albuterol (DUONEB) 0.5-2.5 (3) MG/3ML SOLN Take 3 mLs by nebulization every 4 (four) hours as needed. 360 mL 12   levETIRAcetam (KEPPRA) 100 MG/ML solution Place 10 mLs (1,000 mg total) into feeding tube 2 (two) times daily. 473 mL 12   levothyroxine (SYNTHROID) 175 MCG tablet Place 1 tablet (175 mcg total) into feeding tube daily at 6 (six) AM. 30 tablet 3   loperamide HCl (IMODIUM) 1 MG/7.5ML suspension Place 15 mLs (2 mg total) into feeding tube 2 (two) times daily as needed for diarrhea or loose stools. 120 mL 1   meclizine (ANTIVERT) 12.5 MG tablet Place 1 tablet (12.5 mg total) into feeding tube 3 (three) times daily as needed for dizziness. 30 tablet 0   melatonin 3 MG TABS tablet Place 1 tablet (3 mg total) into feeding tube at bedtime as needed. 30 tablet 3   metoprolol tartrate (LOPRESSOR) 25 MG tablet Place 1 tablet (25 mg total) into feeding tube 2 (two) times daily. 60 tablet 3   Nutritional Supplements (FEEDING SUPPLEMENT, PROSOURCE TF,) liquid Place 45 mLs into feeding tube 3 (three) times daily. 4050 mL 12   ondansetron (ZOFRAN) 4 MG tablet Place 1 tablet (4 mg total) into feeding tube 4 (four) times daily as needed for nausea. 30 tablet 3   pantoprazole (PROTONIX) 40 MG tablet Take 1 tablet (40 mg total) by mouth 2 (two) times daily before a meal. 60 tablet 3   potassium chloride (KLOR-CON) 20 MEQ packet Place 20 mEq into feeding tube daily. 30 packet 3   predniSONE (DELTASONE) 5 MG tablet Take 5 mg by mouth daily.     sertraline (ZOLOFT) 25 MG tablet Place 1 tablet (25 mg total) into feeding tube daily. 30 tablet 3   SM MELATONIN 3 MG TABS tablet Place 1 tablet (3 mg total) into feeding tube at bedtime as needed. 30 tablet 3   Water For Irrigation, Sterile (FREE WATER) SOLN Place 100 mLs into  feeding tube every 6 (six) hours.     No current facility-administered medications for this visit.     Review of Systems  Chronic dyspnea on exertion which seems to be improving.  She has been working with physical therapy with improved strength.  She occasionally notes palpitations as outlined above.  She denies chest pain, PND, orthopnea, dizziness, syncope, edema, or early satiety.  All other systems reviewed and are otherwise negative except as noted above.    Physical Exam    VS:  BP 134/74   Pulse (!) 53   Ht 5\' 6"  (1.676 m)   Wt 195 lb 3.2 oz (88.5 kg)   SpO2 96%   BMI 31.51 kg/m  , BMI Body mass index is 31.51 kg/m.     GEN: Well nourished, well developed, in no acute distress. HEENT: normal. Neck: Supple, no JVD, carotid bruits, or masses. Cardiac: Irregularly irregular, no murmurs, rubs, or gallops. No clubbing, cyanosis, trace bilateral ankle edema.  Radials/DP/PT 2+ and equal bilaterally.  Respiratory:  Respirations regular and unlabored, clear to auscultation bilaterally. GI: Soft, nontender, nondistended, BS + x 4. MS: no deformity or atrophy. Skin: warm and dry, no rash. Neuro:  Strength and sensation are intact. Psych: Normal affect.  Accessory Clinical Findings    ECG personally reviewed by me today -atrial fibrillation, 63, left axis deviation, left anterior fascicular block, nonspecific ST-T changes, ?  Prior anterior infarct- no acute changes.  Lab Results  Component Value Date   WBC 7.5 02/14/2021   HGB 9.4 (L) 02/14/2021   HCT 29.2 (L) 02/14/2021   MCV 91.3 02/14/2021   PLT 235 02/14/2021   Lab Results  Component Value Date   CREATININE 0.93 02/14/2021   BUN 26 (H) 02/14/2021   NA 135 02/14/2021   K 3.6 02/14/2021   CL 103 02/14/2021   CO2 23 02/14/2021   Lab Results  Component Value Date   ALT 14 02/01/2021   AST 17 02/01/2021   ALKPHOS 55 02/01/2021   BILITOT 0.5 02/01/2021   Lab Results  Component Value Date   CHOL 149  08/29/2020   HDL 38 (L) 08/29/2020   LDLCALC 88 08/29/2020   TRIG 105 01/27/2021   CHOLHDL 3.9 08/29/2020    Lab Results  Component Value Date   HGBA1C 7.6 (H) 11/22/2020   Lab Results  Component Value Date   TSH 0.697 01/04/2021    Assessment & Plan    1.  Persistent atrial fibrillation: In the setting of respiratory failure, patient developed rapid atrial fibrillation in June of this year.  She has been managed with rate control on amiodarone therapy, as blood pressures during hospitalization prevented use of AV nodal blocking agents.  She is anticoagulated with Eliquis.  This is her first follow-up since summer hospitalizations and overall, she has been doing reasonably well.  Her strength is steadily improving.  She does have chronic dyspnea on exertion but this is also improved.  She believes that she was maintaining sinus rhythm with only brief episodes of A. fib however, she is in A. fib today at a rate of 63 bpm and is completely asymptomatic.  I suspect her A. fib is more persistent/becoming permanent.  She is currently on amiodarone 2 mg daily and beta-blocker therapy.  We discussed possibly discontinuing amiodarone and continuing and/or titrating beta-blocker however given ongoing chronic respiratory failure requiring trach, with risk of recurrent respiratory infections and thus tachycardia, we collectively agreed to continue amiodarone for rate control.  LFTs and TSH were normal back in August.  Will require surveillance labs at follow-up if not drawn by PCP.  2.  Chronic combined  systolic diastolic congestive heart failure/nonischemic cardiomyopathy: EF 45 to 50% by most recent echo in July of this year.  Pulmonary hypertension noted.  She is relatively euvolemic on examination with stable weight and only trace edema involving her ankles.  Heart rate and blood pressure are well controlled.  Continue current regimen including low-dose Lasix.    3.  Essential hypertension: Stable on  beta-blocker and diuretic therapy.  4.  Hyperlipidemia: On atorvastatin therapy with an LDL of 88 in February.  Normal coronary arteries by catheterization February 2022.  5.  History of chest pain/normal coronary arteries: Most recent catheterization was at [facility] in February 2022 with normal coronaries.  She remains on beta-blocker and atorvastatin therapy.    6.  Pulmonary arterial hypertension: RVSP of 60.1 mmHg on echo in July.  As above, she is euvolemic on examination.  We discussed the importance of daily weights, sodium restriction, medication compliance, and symptom reporting and she verbalizes understanding.   7.  Hypothyroidism: Normal TSH in July.  8.  Chronic hypoxic respiratory failure: Status post trach with use of trach collar and oxygen during the day and ventilator at night.  Followed closely by pulmonology.  9.  Disposition: Follow-up in 2 to 3 months or sooner if necessary.   Murray Hodgkins, NP 05/24/2021, 4:38 PM

## 2021-06-07 DIAGNOSIS — I509 Heart failure, unspecified: Secondary | ICD-10-CM | POA: Diagnosis not present

## 2021-06-07 DIAGNOSIS — J209 Acute bronchitis, unspecified: Secondary | ICD-10-CM | POA: Diagnosis not present

## 2021-06-07 DIAGNOSIS — Z9981 Dependence on supplemental oxygen: Secondary | ICD-10-CM | POA: Diagnosis not present

## 2021-06-07 DIAGNOSIS — E1142 Type 2 diabetes mellitus with diabetic polyneuropathy: Secondary | ICD-10-CM | POA: Diagnosis not present

## 2021-06-09 ENCOUNTER — Ambulatory Visit: Payer: PPO | Admitting: Internal Medicine

## 2021-06-10 ENCOUNTER — Institutional Professional Consult (permissible substitution): Payer: PPO | Admitting: Internal Medicine

## 2021-06-10 NOTE — Progress Notes (Deleted)
Subjective:    Patient ID: [name], female    DOB: Oct 17, 1950,   MRN: [id number]    Brief patient profile:  70   yowf quit smoking 1984 no symptoms referred by Dr Shirlee Latch to pulmonary clinic 04/21/2015 with abn pfts p starting amiodarone  01/29/2015 for CAF     History of Present Illness  04/21/2015 1st [facility] office visit/ [name]   Chief Complaint  Patient presents with   Pulmonary Consult    Referred by Dr. Marca Ancona for eval of abnormal PFT's . Pt had PFT done to est baseline before starting amiodarone. She c/o SOB and cough for the past 3 months. She states that she is SOB somtimes just sitting and walking short distances. Her cough is prod with clear sputum, mainly in the am.   able to [employer] pushing cart slow pace s sob  Cough x 2 years    Carries dx of gastroparesis on marinol and gaining wt  Cough is worst first thing in am x years  rec Add pepcid 20 mg at bedtime  GERD diet     07/22/2015  f/u ov/[name] re:  ? Amiodarone tox  Chief Complaint  Patient presents with   Follow-up    PFT done today. Pt states her breathing has improved. She still c/o occ cough with clear sputum.   cough much better in am since adding pepcid at hs/ limited by knee with activity/ not sob  rec Walk as much as your knee will allow Return for increase cough or difficulty with breathing with exertion  Please schedule a follow up visit in 6  months but call sooner if needed with pfts on return - cancel if you stop amiodarone in the meantime   01/22/2016  f/u ov/[name] re: doe on amiodarone  Chief Complaint  Patient presents with   Follow-up    PFT done today. Breathing is doing well. No new co's today.   limited more by back than knees  rec Please schedule a follow up visit in 6 months but call sooner if needed with cxr / pfts on return unless you stop the amiodarone in the meantime > did not do     Admit date: 11/25/2017 Discharge date: 11/29/2017   1. Acute on chronic  diastolic CHF: presented with weight gain and LE edema. Diuresed with IV lasix with UOP net -1.4L this admission. Weight 237>229lbs on the day of discharge.  Cr stable - 1.08 on the day of discharge.  - Discharged home on demadex 40mg  in AM and 20mg  in PM.    2. Secondary pulmonary HTN: echo this admission with PA pressure , improved from previous.  - Recommend continued management of sleep apnea   3. Paroxysmal atrial fibrillation: maintained sinus rhythm this admission - Continued amiodarone and eliquis.    4. DM type 2: maintained on ISS inpatient - Continued home medications at discharge        12/08/2017  Extended f/u ov/[name] re: re-esatablish re doe/  still on amiodarone on 02 2lpm / none at rest and 2lpm outside the house ok  Chief Complaint  Patient presents with   Follow-up    Per Dr . Pt states had PNA in Feb 2019 and then had to be intubated and then ended up needing a trach.  She had trach removed in 09/10/17.  She has been having some swelling in her legs.  She has occ SOB with "alot of activity".    Dyspnea:  Some grocery shopping on 2lpm  Cough: none Sleep: 2lpm hs ok  Leg swelling severity  correlates well with   Sob Rec No change rx     06/10/2021  Re-establish ov/[name] re: ***   maint on ***  No chief complaint on file.   Dyspnea:  *** Cough: *** Sleeping: *** SABA use: *** 02: *** Covid status:   ***   No obvious day to day or daytime variability or assoc excess/ purulent sputum or mucus plugs or hemoptysis or cp or chest tightness, subjective wheeze or overt sinus or hb symptoms.   *** without nocturnal  or early am exacerbation  of respiratory  c/o's or need for noct saba. Also denies any obvious fluctuation of symptoms with weather or environmental changes or other aggravating or alleviating factors except as outlined above   No unusual exposure hx or h/o childhood pna/ asthma or knowledge of premature birth.  Current Allergies, Complete  Past Medical History, Past Surgical History, Family History, and Social History were reviewed in [employer] record.  ROS  The following are not active complaints unless bolded Hoarseness, sore throat, dysphagia, dental problems, itching, sneezing,  nasal congestion or discharge of excess mucus or purulent secretions, ear ache,   fever, chills, sweats, unintended wt loss or wt gain, classically pleuritic or exertional cp,  orthopnea pnd or arm/hand swelling  or leg swelling, presyncope, palpitations, abdominal pain, anorexia, nausea, vomiting, diarrhea  or change in bowel habits or change in bladder habits, change in stools or change in urine, dysuria, hematuria,  rash, arthralgias, visual complaints, headache, numbness, weakness or ataxia or problems with walking or coordination,  change in mood or  memory.        No outpatient medications have been marked as taking for the 06/10/21 encounter (Appointment) with Tanda Rockers, MD.                                    Objective:   Physical Exam  Wts  06/10/2021         *** 01/22/2016        234   07/22/2015       202  >  04/21/15 189 lb 3.2 oz (85.821 kg)  03/20/15 174 lb (78.926 kg)  03/16/15 172 lb (78.019 kg)     Vital signs reviewed  06/10/2021  - Note at rest 02 sats  ***% on ***   General appearance:    ***       Assessment & Plan:

## 2021-06-14 ENCOUNTER — Other Ambulatory Visit (HOSPITAL_COMMUNITY): Payer: MEDICAID | Attending: Family Medicine

## 2021-06-15 DIAGNOSIS — Z515 Encounter for palliative care: Secondary | ICD-10-CM | POA: Diagnosis not present

## 2021-06-15 DIAGNOSIS — I5042 Chronic combined systolic (congestive) and diastolic (congestive) heart failure: Secondary | ICD-10-CM | POA: Diagnosis not present

## 2021-06-15 DIAGNOSIS — J9610 Chronic respiratory failure, unspecified whether with hypoxia or hypercapnia: Secondary | ICD-10-CM | POA: Diagnosis not present

## 2021-06-16 ENCOUNTER — Ambulatory Visit (HOSPITAL_COMMUNITY)
Admission: RE | Admit: 2021-06-16 | Discharge: 2021-06-16 | Disposition: A | Discharge status: Home / Self Care | Payer: PPO | Source: Ambulatory Visit | Attending: Acute Care | Admitting: Acute Care

## 2021-06-16 DIAGNOSIS — Z430 Encounter for attention to tracheostomy: Secondary | ICD-10-CM | POA: Insufficient documentation

## 2021-06-16 DIAGNOSIS — G4733 Obstructive sleep apnea (adult) (pediatric): Secondary | ICD-10-CM

## 2021-06-16 DIAGNOSIS — E662 Morbid (severe) obesity with alveolar hypoventilation: Secondary | ICD-10-CM | POA: Diagnosis not present

## 2021-06-16 DIAGNOSIS — Z9911 Dependence on respirator [ventilator] status: Secondary | ICD-10-CM | POA: Diagnosis not present

## 2021-06-16 DIAGNOSIS — J9610 Chronic respiratory failure, unspecified whether with hypoxia or hypercapnia: Secondary | ICD-10-CM | POA: Diagnosis not present

## 2021-06-16 DIAGNOSIS — Z930 Tracheostomy status: Secondary | ICD-10-CM | POA: Diagnosis not present

## 2021-06-16 NOTE — Progress Notes (Signed)
Trach clinic   Reason for visit Planned trach change  HPI 70 year old female who is tracheostomy dependent secondary obesity hypoventilation syndrome, and OSA.  Presents today for planned tracheostomy change.  Since her last visit she has been treated for upper respiratory tract infection by primary care providers, on visit today she is feeling better.  Her daughter-in-law who cares for her tells me really the only issues from a tracheostomy standpoint they have been running into is that she has required frequent tracheostomy change as the trach balloon has been rupturing.   Review of Systems  Constitutional: Negative.   HENT: Negative.    Eyes: Negative.   Respiratory:         Has had frequent tracheostomy changes since last visit, estimating at about 2-3.  Daughter-in-law reports that the tracheostomy cuff has been blown.  She is remove the trach collar found the balloon not able to hold air  Cardiovascular: Negative.   Gastrointestinal: Negative.   Skin: Negative.   Neurological: Negative.   Endo/Heme/Allergies: Negative.   Psychiatric/Behavioral: Negative.     Exam Pulse ox mid 90s during entire evaluation  Physical exam General 70 year old white female sitting upright in chair she is in no acute distress today HEENT normocephalic atraumatic able to phonate with a weak but audible voice with PMV in place she has a size 8 cuffed tracheostomy Pulmonary: Clear to auscultation Cardiac: Regular rate and rhythm Abdomen: Soft nontender Extremities: Warm dry with faint pitting edema Neuro: Awake oriented no focal deficits  Procedure  The patient was premedicated with inhaled lidocaine, following that the existing tracheostomy was removed, tracheostomy site was inspected and was unremarkable.  A new tracheostomy was placed through the stoma using an obturator without difficulty.  Placement verified via end-tidal CO2.  Patient tolerated well  Impression/plan Chronic respiratory  failure secondary to OHS/OSA Nocturnal ventilator dependency Tracheostomy dependence  Discussion Patient is doing well from a tracheostomy standpoint with the exception of the difficulty with the tracheostomy pilot balloons blowing.  We did reeducate family in regards to maximum volume of the balloon, its not clear to me that balloon overinflation is the case but needed to cover this.  It certainly possible this is a bad batch of tracheostomies.  We have provided for her a extra tracheostomy in addition to the 1 she has, only other intervention I can think that might change this would be to increase frequency of trach change which we can do.  If this fails we may need to consider an alternative tracheostomy model  Plan Continue every 3 week trach changes, her daughter-in-law will change in 3 weeks, I will see her in 6 and change then Continue nocturnal ventilation Continue routine tracheostomy care If we continue to have trouble with pilot balloon rupture we may need to consider different tracheostomy  My time 32 min   Simonne Martinet ACNP-BC [facility] Pulmonary/Critical Care Pager # [phone] OR # [phone] if no answer

## 2021-06-16 NOTE — Progress Notes (Addendum)
Tracheostomy Procedure Note  LOLITHA TORTORA [id number] 1950/11/03  Pre Procedure Tracheostomy Information  Trach Brand: Shiley Size:  8.0  flex Style: Cuffed Secured by: Velcro   Procedure: Trach change and Trach cleaning Lidocaine neb tx given to patient prior to trach change   Post Procedure Tracheostomy Information  Trach Brand: Shiley Size:  8.0 Style: Cuffed Secured by: Velcro   Post Procedure Evaluation:  ETCO2 positive color change from yellow to purple : Yes.   Vital signs:VSS Patients current condition: stable Complications: No apparent complications Trach site exam: clean, dry Wound care done: 4 x 4 gauze Patient did tolerate procedure well.   Education: none  Prescription needs: none    Additional needs: Patient given a new trach collar and an additional 8.0 flex trach to take home.

## 2021-06-24 ENCOUNTER — Ambulatory Visit: Payer: PPO | Admitting: Internal Medicine

## 2021-07-09 ENCOUNTER — Institutional Professional Consult (permissible substitution): Payer: PPO | Admitting: Internal Medicine

## 2021-07-09 NOTE — Progress Notes (Deleted)
Subjective:    Patient ID: [name], female    DOB: 03/11/1951,   MRN: [id number]    Brief patient profile:  7   yowf quit smoking 1984 no symptoms referred by Dr Aundra Dubin to pulmonary clinic 04/21/2015 with abn pfts p starting amiodarone  01/29/2015 for CAF     History of Present Illness  04/21/2015 1st [facility] office visit/ [name]   Chief Complaint  Patient presents with   Pulmonary Consult    Referred by Dr. Loralie Champagne for eval of abnormal PFT's . Pt had PFT done to est baseline before starting amiodarone. She c/o SOB and cough for the past 3 months. She states that she is SOB somtimes just sitting and walking short distances. Her cough is prod with clear sputum, mainly in the am.   able to [employer] pushing cart slow pace s sob  Cough x 2 years    Carries dx of gastroparesis on marinol and gaining wt  Cough is worst first thing in am x years  rec Add pepcid 20 mg at bedtime  GERD diet     07/22/2015  f/u ov/[name] re:  ? Amiodarone tox  Chief Complaint  Patient presents with   Follow-up    PFT done today. Pt states her breathing has improved. She still c/o occ cough with clear sputum.   cough much better in am since adding pepcid at hs/ limited by knee with activity/ not sob  rec Walk as much as your knee will allow Return for increase cough or difficulty with breathing with exertion  Please schedule a follow up visit in 6  months but call sooner if needed with pfts on return - cancel if you stop amiodarone in the meantime   01/22/2016  f/u ov/[name] re: doe on amiodarone  Chief Complaint  Patient presents with   Follow-up    PFT done today. Breathing is doing well. No new co's today.   limited more by back than knees  rec Please schedule a follow up visit in 6 months but call sooner if needed with cxr / pfts on return unless you stop the amiodarone in the meantime > did not do     Admit date: 11/25/2017 Discharge date: 11/29/2017   1. Acute on chronic  diastolic CHF: presented with weight gain and LE edema. Diuresed with IV lasix with UOP net -1.4L this admission. Weight 237>229lbs on the day of discharge.  Cr stable - 1.08 on the day of discharge.  - Discharged home on demadex 40mg  in AM and 20mg  in PM.    2. Secondary pulmonary HTN: echo this admission with PA pressure 78mmHg, improved from previous.  - Recommend continued management of sleep apnea   3. Paroxysmal atrial fibrillation: maintained sinus rhythm this admission - Continued amiodarone and eliquis.    4. DM type 2: maintained on ISS inpatient - Continued home medications at discharge        12/08/2017  Extended f/u ov/[name] re: re-esatablish re doe/  still on amiodarone on 02 2lpm / none at rest and 2lpm outside the house ok  Chief Complaint  Patient presents with   Follow-up    Per Dr Idolina Primer. Pt states had PNA in Feb 2019 and then had to be intubated and then ended up needing a trach.  She had trach removed in 09/10/17.  She has been having some swelling in her legs.  She has occ SOB with "alot of activity".    Dyspnea:  Some grocery shopping on 2lpm  Cough: none Sleep: 2lpm hs ok  Leg swelling severity  correlates well with   Sob Rec No change rx     07/09/2021  Re-establish ov/[name] re: ***   maint on ***  No chief complaint on file.   Dyspnea:  *** Cough: *** Sleeping: *** SABA use: *** 02: *** Covid status:   ***   No obvious day to day or daytime variability or assoc excess/ purulent sputum or mucus plugs or hemoptysis or cp or chest tightness, subjective wheeze or overt sinus or hb symptoms.   *** without nocturnal  or early am exacerbation  of respiratory  c/o's or need for noct saba. Also denies any obvious fluctuation of symptoms with weather or environmental changes or other aggravating or alleviating factors except as outlined above   No unusual exposure hx or h/o childhood pna/ asthma or knowledge of premature birth.  Current Allergies, Complete Past  Medical History, Past Surgical History, Family History, and Social History were reviewed in [employer] record.  ROS  The following are not active complaints unless bolded Hoarseness, sore throat, dysphagia, dental problems, itching, sneezing,  nasal congestion or discharge of excess mucus or purulent secretions, ear ache,   fever, chills, sweats, unintended wt loss or wt gain, classically pleuritic or exertional cp,  orthopnea pnd or arm/hand swelling  or leg swelling, presyncope, palpitations, abdominal pain, anorexia, nausea, vomiting, diarrhea  or change in bowel habits or change in bladder habits, change in stools or change in urine, dysuria, hematuria,  rash, arthralgias, visual complaints, headache, numbness, weakness or ataxia or problems with walking or coordination,  change in mood or  memory.        No outpatient medications have been marked as taking for the 07/09/21 encounter (Appointment) with Tanda Rockers, MD.                                    Objective:   Physical Exam  Wts  07/09/2021         *** 01/22/2016        234   07/22/2015       202  >  04/21/15 189 lb 3.2 oz (85.821 kg)  03/20/15 174 lb (78.926 kg)  03/16/15 172 lb (78.019 kg)     Vital signs reviewed  07/09/2021  - Note at rest 02 sats  ***% on ***   General appearance:    ***       Assessment & Plan:

## 2021-07-18 ENCOUNTER — Encounter (HOSPITAL_COMMUNITY)
Admission: EM | Disposition: A | Discharge status: Home / Self Care | Payer: Self-pay | Source: Home / Self Care | Attending: Emergency Medicine

## 2021-07-18 ENCOUNTER — Encounter (HOSPITAL_COMMUNITY): Payer: Self-pay | Admitting: Internal Medicine

## 2021-07-18 ENCOUNTER — Observation Stay (HOSPITAL_COMMUNITY): Payer: HMO | Admitting: Certified Registered Nurse Anesthetist

## 2021-07-18 ENCOUNTER — Observation Stay (HOSPITAL_COMMUNITY)
Admission: EM | Admit: 2021-07-18 | Discharge: 2021-07-19 | Disposition: A | Discharge status: Home / Self Care | Payer: HMO | Attending: Internal Medicine | Admitting: Internal Medicine

## 2021-07-18 ENCOUNTER — Encounter (HOSPITAL_COMMUNITY): Payer: Self-pay | Admitting: Family Medicine

## 2021-07-18 DIAGNOSIS — Z430 Encounter for attention to tracheostomy: Secondary | ICD-10-CM

## 2021-07-18 DIAGNOSIS — I110 Hypertensive heart disease with heart failure: Secondary | ICD-10-CM | POA: Insufficient documentation

## 2021-07-18 DIAGNOSIS — Z87891 Personal history of nicotine dependence: Secondary | ICD-10-CM | POA: Diagnosis not present

## 2021-07-18 DIAGNOSIS — Z7901 Long term (current) use of anticoagulants: Secondary | ICD-10-CM | POA: Diagnosis not present

## 2021-07-18 DIAGNOSIS — J9500 Unspecified tracheostomy complication: Secondary | ICD-10-CM | POA: Diagnosis present

## 2021-07-18 DIAGNOSIS — Z20822 Contact with and (suspected) exposure to covid-19: Secondary | ICD-10-CM | POA: Diagnosis not present

## 2021-07-18 DIAGNOSIS — I5042 Chronic combined systolic (congestive) and diastolic (congestive) heart failure: Secondary | ICD-10-CM | POA: Insufficient documentation

## 2021-07-18 DIAGNOSIS — Z79899 Other long term (current) drug therapy: Secondary | ICD-10-CM | POA: Diagnosis not present

## 2021-07-18 DIAGNOSIS — I10 Essential (primary) hypertension: Secondary | ICD-10-CM

## 2021-07-18 DIAGNOSIS — E039 Hypothyroidism, unspecified: Secondary | ICD-10-CM | POA: Diagnosis not present

## 2021-07-18 DIAGNOSIS — J9612 Chronic respiratory failure with hypercapnia: Secondary | ICD-10-CM | POA: Insufficient documentation

## 2021-07-18 DIAGNOSIS — J9611 Chronic respiratory failure with hypoxia: Secondary | ICD-10-CM | POA: Insufficient documentation

## 2021-07-18 DIAGNOSIS — I4891 Unspecified atrial fibrillation: Secondary | ICD-10-CM | POA: Diagnosis present

## 2021-07-18 DIAGNOSIS — E1165 Type 2 diabetes mellitus with hyperglycemia: Secondary | ICD-10-CM | POA: Diagnosis not present

## 2021-07-18 DIAGNOSIS — I480 Paroxysmal atrial fibrillation: Secondary | ICD-10-CM | POA: Diagnosis not present

## 2021-07-18 DIAGNOSIS — J9503 Malfunction of tracheostomy stoma: Principal | ICD-10-CM

## 2021-07-18 DIAGNOSIS — I5022 Chronic systolic (congestive) heart failure: Secondary | ICD-10-CM

## 2021-07-18 DIAGNOSIS — Z9911 Dependence on respirator [ventilator] status: Secondary | ICD-10-CM

## 2021-07-18 DIAGNOSIS — F32A Depression, unspecified: Secondary | ICD-10-CM

## 2021-07-18 DIAGNOSIS — I4811 Longstanding persistent atrial fibrillation: Secondary | ICD-10-CM

## 2021-07-18 DIAGNOSIS — G4733 Obstructive sleep apnea (adult) (pediatric): Secondary | ICD-10-CM

## 2021-07-18 DIAGNOSIS — F0390 Unspecified dementia without behavioral disturbance: Secondary | ICD-10-CM | POA: Diagnosis not present

## 2021-07-18 DIAGNOSIS — N289 Disorder of kidney and ureter, unspecified: Secondary | ICD-10-CM

## 2021-07-18 DIAGNOSIS — R569 Unspecified convulsions: Secondary | ICD-10-CM

## 2021-07-18 HISTORY — PX: TRACHEOSTOMY REVISION: SHX6133

## 2021-07-18 LAB — GLUCOSE, CAPILLARY
Glucose-Capillary: 193 mg/dL — ABNORMAL HIGH (ref 70–99)
Glucose-Capillary: 202 mg/dL — ABNORMAL HIGH (ref 70–99)
Glucose-Capillary: 186 mg/dL — ABNORMAL HIGH (ref 70–99)
Glucose-Capillary: 156 mg/dL — ABNORMAL HIGH (ref 70–99)

## 2021-07-18 LAB — BASIC METABOLIC PANEL
Anion gap: 6 (ref 5–15)
Anion gap: 11 (ref 5–15)
BUN: 23 mg/dL (ref 8–23)
BUN: 22 mg/dL (ref 8–23)
CO2: 21 mmol/L — ABNORMAL LOW (ref 22–32)
CO2: 22 mmol/L (ref 22–32)
Calcium: 9 mg/dL (ref 8.9–10.3)
Calcium: 9.1 mg/dL (ref 8.9–10.3)
Chloride: 110 mmol/L (ref 98–111)
Chloride: 108 mmol/L (ref 98–111)
Creatinine, Ser: 1.24 mg/dL — ABNORMAL HIGH (ref 0.44–1.00)
Creatinine, Ser: 1.09 mg/dL — ABNORMAL HIGH (ref 0.44–1.00)
GFR, Estimated: 46 mL/min — ABNORMAL LOW (ref >60)
GFR, Estimated: 54 mL/min — ABNORMAL LOW (ref >60)
Glucose, Bld: 291 mg/dL — ABNORMAL HIGH (ref 70–99)
Glucose, Bld: 237 mg/dL — ABNORMAL HIGH (ref 70–99)
Potassium: 4.1 mmol/L (ref 3.5–5.1)
Potassium: 3.9 mmol/L (ref 3.5–5.1)
Sodium: 137 mmol/L (ref 135–145)
Sodium: 141 mmol/L (ref 135–145)

## 2021-07-18 LAB — CBC WITH DIFFERENTIAL/PLATELET
Abs Immature Granulocytes: 0.05 10*3/uL (ref 0.00–0.07)
Basophils Absolute: 0.1 10*3/uL (ref 0.0–0.1)
Basophils Relative: 1 %
Eosinophils Absolute: 0.1 10*3/uL (ref 0.0–0.5)
Eosinophils Relative: 1 %
HCT: 32 % — ABNORMAL LOW (ref 36.0–46.0)
Hemoglobin: 10 g/dL — ABNORMAL LOW (ref 12.0–15.0)
Immature Granulocytes: 1 %
Lymphocytes Relative: 33 %
Lymphs Abs: 2.8 10*3/uL (ref 0.7–4.0)
MCH: 29.6 pg (ref 26.0–34.0)
MCHC: 31.3 g/dL (ref 30.0–36.0)
MCV: 94.7 fL (ref 80.0–100.0)
Monocytes Absolute: 0.8 10*3/uL (ref 0.1–1.0)
Monocytes Relative: 10 %
Neutro Abs: 4.5 10*3/uL (ref 1.7–7.7)
Neutrophils Relative %: 54 %
Platelets: 177 10*3/uL (ref 150–400)
RBC: 3.38 MIL/uL — ABNORMAL LOW (ref 3.87–5.11)
RDW: 16.5 % — ABNORMAL HIGH (ref 11.5–15.5)
WBC: 8.3 10*3/uL (ref 4.0–10.5)
nRBC: 0 % (ref 0.0–0.2)

## 2021-07-18 LAB — CBC
HCT: 30.4 % — ABNORMAL LOW (ref 36.0–46.0)
Hemoglobin: 9.2 g/dL — ABNORMAL LOW (ref 12.0–15.0)
MCH: 29 pg (ref 26.0–34.0)
MCHC: 30.3 g/dL (ref 30.0–36.0)
MCV: 95.9 fL (ref 80.0–100.0)
Platelets: 171 10*3/uL (ref 150–400)
RBC: 3.17 MIL/uL — ABNORMAL LOW (ref 3.87–5.11)
RDW: 16.4 % — ABNORMAL HIGH (ref 11.5–15.5)
WBC: 7.5 10*3/uL (ref 4.0–10.5)
nRBC: 0 % (ref 0.0–0.2)

## 2021-07-18 LAB — CBG MONITORING, ED
Glucose-Capillary: 235 mg/dL — ABNORMAL HIGH (ref 70–99)
Glucose-Capillary: 220 mg/dL — ABNORMAL HIGH (ref 70–99)
Glucose-Capillary: 245 mg/dL — ABNORMAL HIGH (ref 70–99)

## 2021-07-18 LAB — MAGNESIUM: Magnesium: 2 mg/dL (ref 1.7–2.4)

## 2021-07-18 LAB — TYPE AND SCREEN
ABO/RH(D): A POS
Antibody Screen: NEGATIVE

## 2021-07-18 LAB — RESP PANEL BY RT-PCR (FLU A&B, COVID) ARPGX2
Influenza A by PCR: NEGATIVE
Influenza B by PCR: NEGATIVE
SARS Coronavirus 2 by RT PCR: NEGATIVE

## 2021-07-18 LAB — PROTIME-INR
INR: 1.3 — ABNORMAL HIGH (ref 0.8–1.2)
Prothrombin Time: 16.6 seconds — ABNORMAL HIGH (ref 11.4–15.2)

## 2021-07-18 LAB — MRSA NEXT GEN BY PCR, NASAL: MRSA by PCR Next Gen: NOT DETECTED

## 2021-07-18 SURGERY — REVISION, STOMA, TRACHEA
Anesthesia: General

## 2021-07-18 MED ORDER — ONDANSETRON HCL 4 MG/2ML IJ SOLN
INTRAMUSCULAR | Status: DC | PRN
  Administered 2021-07-18: 4 mg via INTRAVENOUS

## 2021-07-18 MED ORDER — MIDAZOLAM HCL 2 MG/2ML IJ SOLN
INTRAMUSCULAR | Status: AC
  Filled 2021-07-18: qty 2

## 2021-07-18 MED ORDER — STERILE WATER FOR IRRIGATION IR SOLN
Status: DC | PRN
  Administered 2021-07-18: 1000 mL

## 2021-07-18 MED ORDER — FENTANYL CITRATE (PF) 250 MCG/5ML IJ SOLN
INTRAMUSCULAR | Status: DC | PRN
Start: 2021-07-18 — End: 2021-07-18
  Administered 2021-07-18 (×2): 50 ug via INTRAVENOUS

## 2021-07-18 MED ORDER — LEVOTHYROXINE SODIUM 75 MCG PO TABS
175.0000 ug | ORAL_TABLET | Freq: Every day | ORAL | Status: DC
  Administered 2021-07-18 – 2021-07-19 (×2): 175 ug via ORAL
  Filled 2021-07-18 (×2): qty 1

## 2021-07-18 MED ORDER — SERTRALINE HCL 25 MG PO TABS
25.0000 mg | ORAL_TABLET | Freq: Every day | ORAL | Status: DC
  Administered 2021-07-18 – 2021-07-19 (×2): 25 mg via ORAL
  Filled 2021-07-18 (×2): qty 1

## 2021-07-18 MED ORDER — DROPERIDOL 2.5 MG/ML IJ SOLN: 0.6250 mg | Freq: Once | INTRAMUSCULAR | Status: DC | PRN

## 2021-07-18 MED ORDER — DEXAMETHASONE SODIUM PHOSPHATE 10 MG/ML IJ SOLN
INTRAMUSCULAR | Status: AC
  Filled 2021-07-18: qty 1

## 2021-07-18 MED ORDER — ACETAMINOPHEN 325 MG PO TABS
650.0000 mg | ORAL_TABLET | Freq: Four times a day (QID) | ORAL | Status: DC | PRN
  Administered 2021-07-18 (×3): 650 mg via ORAL
  Filled 2021-07-18 (×3): qty 2

## 2021-07-18 MED ORDER — ROCURONIUM BROMIDE 10 MG/ML (PF) SYRINGE
PREFILLED_SYRINGE | INTRAVENOUS | Status: DC | PRN
Start: 2021-07-18 — End: 2021-07-18
  Administered 2021-07-18: 30 mg via INTRAVENOUS

## 2021-07-18 MED ORDER — SUCCINYLCHOLINE CHLORIDE 200 MG/10ML IV SOSY
PREFILLED_SYRINGE | INTRAVENOUS | Status: DC | PRN
  Administered 2021-07-18: 80 mg via INTRAVENOUS

## 2021-07-18 MED ORDER — MIDAZOLAM HCL 2 MG/2ML IJ SOLN
INTRAMUSCULAR | Status: DC | PRN
  Administered 2021-07-18: 1 mg via INTRAVENOUS

## 2021-07-18 MED ORDER — MELATONIN 3 MG PO TABS
3.0000 mg | ORAL_TABLET | Freq: Every evening | ORAL | Status: DC | PRN
  Administered 2021-07-18: 3 mg via ORAL
  Filled 2021-07-18: qty 1

## 2021-07-18 MED ORDER — PHENYLEPHRINE 40 MCG/ML (10ML) SYRINGE FOR IV PUSH (FOR BLOOD PRESSURE SUPPORT)
PREFILLED_SYRINGE | INTRAVENOUS | Status: DC | PRN
Start: 2021-07-18 — End: 2021-07-18
  Administered 2021-07-18: 80 ug via INTRAVENOUS
  Administered 2021-07-18: 40 ug via INTRAVENOUS

## 2021-07-18 MED ORDER — ACETAMINOPHEN 650 MG RE SUPP: 650.0000 mg | Freq: Four times a day (QID) | RECTAL | Status: DC | PRN

## 2021-07-18 MED ORDER — ROCURONIUM BROMIDE 10 MG/ML (PF) SYRINGE
PREFILLED_SYRINGE | INTRAVENOUS | Status: AC
  Filled 2021-07-18: qty 10

## 2021-07-18 MED ORDER — ONDANSETRON HCL 4 MG/2ML IJ SOLN
INTRAMUSCULAR | Status: AC
  Filled 2021-07-18: qty 2

## 2021-07-18 MED ORDER — CHLORHEXIDINE GLUCONATE 0.12 % MT SOLN
15.0000 mL | Freq: Once | OROMUCOSAL | Status: AC
Start: 2021-07-18 — End: 2021-07-18
  Filled 2021-07-18: qty 15

## 2021-07-18 MED ORDER — INSULIN GLARGINE-YFGN 100 UNIT/ML ~~LOC~~ SOLN
7.0000 [IU] | Freq: Every day | SUBCUTANEOUS | Status: DC
  Filled 2021-07-18: qty 0.07

## 2021-07-18 MED ORDER — ARTIFICIAL TEARS OPHTHALMIC OINT
TOPICAL_OINTMENT | OPHTHALMIC | Status: AC
  Filled 2021-07-18: qty 3.5

## 2021-07-18 MED ORDER — INSULIN GLARGINE-YFGN 100 UNIT/ML ~~LOC~~ SOLN
15.0000 [IU] | Freq: Every day | SUBCUTANEOUS | Status: DC
  Administered 2021-07-18: 15 [IU] via SUBCUTANEOUS
  Filled 2021-07-18 (×2): qty 0.15

## 2021-07-18 MED ORDER — IPRATROPIUM-ALBUTEROL 0.5-2.5 (3) MG/3ML IN SOLN
3.0000 mL | RESPIRATORY_TRACT | Status: DC | PRN
  Administered 2021-07-18: 3 mL via RESPIRATORY_TRACT
  Filled 2021-07-18 (×2): qty 3

## 2021-07-18 MED ORDER — AMIODARONE HCL 200 MG PO TABS
200.0000 mg | ORAL_TABLET | Freq: Every day | ORAL | Status: DC
  Administered 2021-07-18 – 2021-07-19 (×2): 200 mg via ORAL
  Filled 2021-07-18 (×2): qty 1

## 2021-07-18 MED ORDER — ORAL CARE MOUTH RINSE: 15.0000 mL | Freq: Two times a day (BID) | OROMUCOSAL | Status: DC

## 2021-07-18 MED ORDER — PREDNISONE 10 MG PO TABS
5.0000 mg | ORAL_TABLET | ORAL | Status: DC
  Administered 2021-07-18: 5 mg via ORAL
  Filled 2021-07-18: qty 1

## 2021-07-18 MED ORDER — LORAZEPAM 0.5 MG PO TABS
0.5000 mg | ORAL_TABLET | Freq: Every day | ORAL | Status: DC | PRN
  Administered 2021-07-18 (×2): 0.5 mg via ORAL
  Filled 2021-07-18 (×2): qty 1

## 2021-07-18 MED ORDER — CHLORHEXIDINE GLUCONATE 0.12 % MT SOLN
15.0000 mL | Freq: Two times a day (BID) | OROMUCOSAL | Status: DC
Start: 2021-07-18 — End: 2021-07-19
  Administered 2021-07-18 – 2021-07-19 (×2): 15 mL via OROMUCOSAL

## 2021-07-18 MED ORDER — SODIUM CHLORIDE 0.9% FLUSH
3.0000 mL | Freq: Two times a day (BID) | INTRAVENOUS | Status: DC
  Administered 2021-07-18 – 2021-07-19 (×3): 3 mL via INTRAVENOUS

## 2021-07-18 MED ORDER — FENTANYL CITRATE (PF) 100 MCG/2ML IJ SOLN: 25.0000 ug | INTRAMUSCULAR | Status: DC | PRN

## 2021-07-18 MED ORDER — ACETAMINOPHEN 325 MG PO TABS: 650.0000 mg | ORAL_TABLET | Freq: Four times a day (QID) | ORAL | Status: DC | PRN

## 2021-07-18 MED ORDER — LEVETIRACETAM 500 MG PO TABS
500.0000 mg | ORAL_TABLET | Freq: Two times a day (BID) | ORAL | Status: DC
  Administered 2021-07-18 – 2021-07-19 (×3): 500 mg via ORAL
  Filled 2021-07-18 (×3): qty 1

## 2021-07-18 MED ORDER — DEXAMETHASONE SODIUM PHOSPHATE 10 MG/ML IJ SOLN
INTRAMUSCULAR | Status: DC | PRN
  Administered 2021-07-18: 5 mg via INTRAVENOUS

## 2021-07-18 MED ORDER — GABAPENTIN 100 MG PO CAPS
100.0000 mg | ORAL_CAPSULE | Freq: Three times a day (TID) | ORAL | Status: DC
  Administered 2021-07-18 – 2021-07-19 (×3): 100 mg via ORAL
  Filled 2021-07-18 (×3): qty 1

## 2021-07-18 MED ORDER — FENTANYL CITRATE (PF) 250 MCG/5ML IJ SOLN
INTRAMUSCULAR | Status: AC
  Filled 2021-07-18: qty 5

## 2021-07-18 MED ORDER — FUROSEMIDE 20 MG PO TABS: 40.0000 mg | ORAL_TABLET | Freq: Every day | ORAL | Status: DC

## 2021-07-18 MED ORDER — SUCCINYLCHOLINE CHLORIDE 200 MG/10ML IV SOSY
PREFILLED_SYRINGE | INTRAVENOUS | Status: AC
  Filled 2021-07-18: qty 10

## 2021-07-18 MED ORDER — PROPOFOL 10 MG/ML IV BOLUS
INTRAVENOUS | Status: DC | PRN
  Administered 2021-07-18: 100 mg via INTRAVENOUS

## 2021-07-18 MED ORDER — CHLORHEXIDINE GLUCONATE 0.12 % MT SOLN
OROMUCOSAL | Status: AC
  Administered 2021-07-18: 15 mL via OROMUCOSAL
  Filled 2021-07-18: qty 15

## 2021-07-18 MED ORDER — INSULIN ASPART 100 UNIT/ML IJ SOLN
0.0000 [IU] | INTRAMUSCULAR | Status: DC
  Administered 2021-07-18 (×2): 2 [IU] via SUBCUTANEOUS
  Administered 2021-07-18 (×2): 3 [IU] via SUBCUTANEOUS

## 2021-07-18 MED ORDER — LIDOCAINE-EPINEPHRINE 1 %-1:100000 IJ SOLN
INTRAMUSCULAR | Status: AC
  Filled 2021-07-18: qty 1

## 2021-07-18 MED ORDER — PHENYLEPHRINE 40 MCG/ML (10ML) SYRINGE FOR IV PUSH (FOR BLOOD PRESSURE SUPPORT)
PREFILLED_SYRINGE | INTRAVENOUS | Status: AC
  Filled 2021-07-18: qty 10

## 2021-07-18 MED ORDER — LIDOCAINE 2% (20 MG/ML) 5 ML SYRINGE
INTRAMUSCULAR | Status: DC | PRN
  Administered 2021-07-18: 100 mg via INTRAVENOUS

## 2021-07-18 MED ORDER — PROPOFOL 10 MG/ML IV BOLUS
INTRAVENOUS | Status: AC
  Filled 2021-07-18: qty 20

## 2021-07-18 MED ORDER — LACTATED RINGERS IV SOLN: INTRAVENOUS | Status: DC

## 2021-07-18 MED ORDER — METOPROLOL TARTRATE 25 MG PO TABS
25.0000 mg | ORAL_TABLET | Freq: Two times a day (BID) | ORAL | Status: DC
  Administered 2021-07-18 – 2021-07-19 (×3): 25 mg via ORAL
  Filled 2021-07-18 (×4): qty 1

## 2021-07-18 MED ORDER — ACETAMINOPHEN 500 MG PO TABS: 500.0000 mg | ORAL_TABLET | ORAL | Status: DC | PRN

## 2021-07-18 MED ORDER — LIDOCAINE 2% (20 MG/ML) 5 ML SYRINGE
INTRAMUSCULAR | Status: AC
  Filled 2021-07-18: qty 5

## 2021-07-18 MED ORDER — ATORVASTATIN CALCIUM 80 MG PO TABS
80.0000 mg | ORAL_TABLET | Freq: Every day | ORAL | Status: DC
  Administered 2021-07-18 – 2021-07-19 (×2): 80 mg via ORAL
  Filled 2021-07-18: qty 1
  Filled 2021-07-18: qty 2

## 2021-07-18 MED ORDER — CHLORHEXIDINE GLUCONATE CLOTH 2 % EX PADS
6.0000 | MEDICATED_PAD | Freq: Every day | CUTANEOUS | Status: DC
  Administered 2021-07-18 – 2021-07-19 (×2): 6 via TOPICAL

## 2021-07-18 MED ORDER — INSULIN GLARGINE-YFGN 100 UNIT/ML ~~LOC~~ SOLN
10.0000 [IU] | Freq: Every day | SUBCUTANEOUS | Status: DC
  Filled 2021-07-18: qty 0.1

## 2021-07-18 MED ORDER — 0.9 % SODIUM CHLORIDE (POUR BTL) OPTIME
TOPICAL | Status: DC | PRN
Start: 2021-07-18 — End: 2021-07-18
  Administered 2021-07-18: 1000 mL

## 2021-07-18 MED ORDER — DONEPEZIL HCL 5 MG PO TABS
5.0000 mg | ORAL_TABLET | Freq: Every day | ORAL | Status: DC
Start: 2021-07-18 — End: 2021-07-19
  Administered 2021-07-18: 5 mg via ORAL
  Filled 2021-07-18: qty 1

## 2021-07-18 MED ORDER — DULOXETINE HCL 30 MG PO CPEP
30.0000 mg | ORAL_CAPSULE | Freq: Every day | ORAL | Status: DC
  Administered 2021-07-18 – 2021-07-19 (×2): 30 mg via ORAL
  Filled 2021-07-18 (×2): qty 1

## 2021-07-18 MED ORDER — SUGAMMADEX SODIUM 200 MG/2ML IV SOLN
INTRAVENOUS | Status: DC | PRN
  Administered 2021-07-18: 200 mg via INTRAVENOUS

## 2021-07-18 MED ORDER — PANTOPRAZOLE SODIUM 40 MG PO TBEC
40.0000 mg | DELAYED_RELEASE_TABLET | Freq: Two times a day (BID) | ORAL | Status: DC
  Administered 2021-07-18 – 2021-07-19 (×2): 40 mg via ORAL
  Filled 2021-07-18 (×2): qty 1

## 2021-07-18 SURGICAL SUPPLY — 37 items
BAG COUNTER SPONGE SURGICOUNT (BAG) ×3 IMPLANT
BAG SPNG CNTER NS LX DISP (BAG) ×1
BLADE SURG 15 STRL LF DISP TIS (BLADE) IMPLANT
BLADE SURG 15 STRL SS (BLADE)
CANISTER SUCT 3000ML PPV (MISCELLANEOUS) ×3 IMPLANT
CLEANER TIP ELECTROSURG 2X2 (MISCELLANEOUS) ×4 IMPLANT
COAGULATOR SUCT SWTCH 10FR 6 (ELECTROSURGICAL) ×1 IMPLANT
COVER SURGICAL LIGHT HANDLE (MISCELLANEOUS) ×3 IMPLANT
DRAPE HALF SHEET 40X57 (DRAPES) IMPLANT
ELECT COATED BLADE 2.86 ST (ELECTRODE) ×3 IMPLANT
ELECT REM PT RETURN 9FT ADLT (ELECTROSURGICAL) ×2
ELECTRODE REM PT RTRN 9FT ADLT (ELECTROSURGICAL) ×2 IMPLANT
GAUZE 4X4 16PLY ~~LOC~~+RFID DBL (SPONGE) ×3 IMPLANT
GLOVE SURG LTX SZ7.5 (GLOVE) ×6 IMPLANT
GOWN STRL REUS W/ TWL LRG LVL3 (GOWN DISPOSABLE) ×4 IMPLANT
GOWN STRL REUS W/TWL LRG LVL3 (GOWN DISPOSABLE) ×4
KIT BASIN OR (CUSTOM PROCEDURE TRAY) ×3 IMPLANT
KIT TURNOVER KIT B (KITS) ×3 IMPLANT
NDL HYPO 25GX1X1/2 BEV (NEEDLE) ×2 IMPLANT
NEEDLE HYPO 25GX1X1/2 BEV (NEEDLE) ×2 IMPLANT
NS IRRIG 1000ML POUR BTL (IV SOLUTION) ×3 IMPLANT
PAD ARMBOARD 7.5X6 YLW CONV (MISCELLANEOUS) ×6 IMPLANT
PENCIL FOOT CONTROL (ELECTRODE) ×3 IMPLANT
POSITIONER HEAD DONUT 9IN (MISCELLANEOUS) ×1 IMPLANT
SPONGE DRAIN TRACH 4X4 STRL 2S (GAUZE/BANDAGES/DRESSINGS) ×1 IMPLANT
STRIP CLOSURE SKIN 1/2X4 (GAUZE/BANDAGES/DRESSINGS) IMPLANT
SURGILUBE 2OZ TUBE FLIPTOP (MISCELLANEOUS) IMPLANT
SUT CHROMIC GUT 2 0 PS 2 27 (SUTURE) ×3 IMPLANT
SUT ETHILON 3 0 PS 1 (SUTURE) ×3 IMPLANT
SUT SILK 3 0 SH 30 (SUTURE) ×3 IMPLANT
SUT SILK 3 0 TIES 17X18 (SUTURE) ×2
SUT SILK 3-0 18XBRD TIE BLK (SUTURE) ×2 IMPLANT
SYR CONTROL 10ML LL (SYRINGE) ×3 IMPLANT
TOWEL GREEN STERILE FF (TOWEL DISPOSABLE) IMPLANT
TRAY ENT MC OR (CUSTOM PROCEDURE TRAY) ×3 IMPLANT
TUBE CONNECTING 12X1/4 (SUCTIONS) ×3 IMPLANT
WATER STERILE IRR 1000ML POUR (IV SOLUTION) ×3 IMPLANT

## 2021-07-18 NOTE — ED Notes (Signed)
Report given to short stay.  

## 2021-07-18 NOTE — H&P (Signed)
History and Physical    [name] [id number] DOB: 1950/07/11 DOA: 07/18/2021  PCP: Lemmie Evens, MD   Patient coming from: Home   Chief Complaint: Tracheostomy problem   HPI: [name] is a pleasant 71 y.o. female with medical history significant for insulin-dependent diabetes mellitus, hypertension, depression, anxiety, seizures, dementia, atrial fibrillation on Eliquis, chronic systolic CHF, OSA and OHS, chronic hypoxic and hypercarbic respiratory failure, and BiPAP intolerance on ventilator at home who presents after trach could not be reinserted on routine exchange.  Patient had been in her usual state of health when her daughter was attempting to perform routine exchange but was unable on multiple attempts to insert the trach.  She has become somewhat anxious following this but denies any difficulty breathing or chest pain.  ED Course: Upon arrival to the ED, patient is found to be afebrile, saturating mid to upper 90s on 4 L/min supplemental oxygen, and with stable blood pressure.  Chemistry panel notable for glucose 291 and creatinine 1.24.  CBC with stable normocytic anemia.  PCCM was consulted by the ED physician, evaluated the patient in the ED, and recommended admission to the hospitalist service.  Review of Systems:  All other systems reviewed and apart from HPI, are negative.  Past Medical History:  Diagnosis Date   Abnormal pulmonary function test    Anemia    H/H of 10/30 with a normal MCV in 12/09   Anxiety    Arthritis    Barrett's esophagus    Diagnosed 1995. Last EGD 2016-NO BARRETT'S.    Chest pain    a. 2022 Cath: nl cors; b. 2008 Neg myoview; c. 01/2014 Cath: Nl cors. EF 40%; c. 08/2020 NSTEMI/Cath [facility]): nl cors.   Chronic anticoagulation    Chronic combined systolic and diastolic CHF (congestive heart failure) ([place])    a. 10/2014 Echo: EF40%; b. 01/2015 Echo: EF 55-60%; c. 11/2020 Echo: EF 55-60%, mod LVH, sev BAE, mild MR, mod TR. PASP 37mmHg; d.  01/2021 Echo: EF 45-50%, glob HK, mod conc LVH, mod reduced RV fxn, RVSP 60.86mmHg, Mod TR, mild to mod MR, sev dil LA.   Chronic LBP    Surgical intervention in 1996   Diabetes mellitus, type 2 ([place])    Insulin therapy; exacerbated by prednisone   Gastroparesis    99% retention 05/2008 on GES   GERD (gastroesophageal reflux disease)    Heart attack ([place]) 08/13/2020   Hiatal hernia    Hyperlipidemia    Hypertension    Hypothyroid    IBS (irritable bowel syndrome)    Obesity    OSA on CPAP    had CPAP and cannot tolerate.   Paroxysmal atrial fibrillation (HCC)    a.CHA2DS2VASc = 6-->eliquis. Also on Amio.   Pulmonary hypertension ([place]) 01/2015   a. 01/2015 RHC: Predominantly pulmonary venous hypertension but may be component of PAH (PA mean 52, PCWP 31); b. 01/2021 Echo: RVSP 60.43mmHg w/ mod reduced RV fxn.   Seizures ([place])    last seizure was 2 years ago; on keppra for this; unknown etiology   Syncope    a. Admitted 05/2009; magnetic resonance imagin/ MRA - negative; etiology thought to be orthostasis secondary to drugs and dehydration. b. Syncope 02/2015 also felt 2/2 dehydration.    Past Surgical History:  Procedure Laterality Date   BACK SURGERY  1996   BIOPSY N/A 11/08/2013   Procedure: BIOPSY  / Tissue sampling / ulcers present in small intestine;  Surgeon: Danie Binder, MD;  [facility];  Service: Endoscopy;  Laterality: N/A;   CARDIAC CATHETERIZATION  2002   CARDIAC CATHETERIZATION N/A 01/26/2015   Procedure: Right Heart Cath;  Surgeon: Laurey Morale, MD;  Location: [facility] INVASIVE CV LAB;  Service: Cardiovascular;  Laterality: N/A;   CARDIOVASCULAR STRESS TEST  2008   Stress nuclear study   CARDIOVERSION N/A 03/06/2015   Procedure: CARDIOVERSION;  Surgeon: Laurey Morale, MD;  Location: [facility] ENDOSCOPY;  Service: Cardiovascular;  Laterality: N/A;   CARPAL TUNNEL RELEASE  1994   COLONOSCOPY  11/2011   Dr. Darrick Penna: Internal hemorrhoids, mild diverticulosis. Random colon  biopsies negative.   COLONOSCOPY N/A 11/08/2013   SLF: Normal mucosa in the terminal ileum/The colon IS redundant/  Moderate diverticulosis throughout the entire colon. ileum bx benign. colon bx benign   CRYOTHERAPY  01/23/2021   Procedure: CRYOTHERAPY;  Surgeon: Lorin Glass, MD;  Location: [facility] ENDOSCOPY;  Service: Pulmonary;;   CRYOTHERAPY  01/24/2021   Procedure: Cloyde Reams;  Surgeon: Lorin Glass, MD;  Location: [facility] ENDOSCOPY;  Service: Pulmonary;;   CRYOTHERAPY  01/26/2021   Procedure: Cloyde Reams;  Surgeon: Lupita Leash, MD;  Location: [facility] ENDOSCOPY;  Service: Cardiopulmonary;;   ESOPHAGOGASTRODUODENOSCOPY  2008   Barrett's without dysplasia. esphagus dilated. antral erosions, h.pylori serologies negative.   ESOPHAGOGASTRODUODENOSCOPY  11/2011   Dr. Darrick Penna: Barrett's esophagus, mild gastritis, diverticulum in the second portion of the duodenum repeat EGD 3 years. Small bowel biopsies negative. Gastric biopsy show reactive gastropathy but no H. pylori. Esophageal biopsies consistent with GERD. Next EGD 11/2014   ESOPHAGOGASTRODUODENOSCOPY N/A 11/21/2014   DTO:IZTI non-erosive gastritis/irregular z-line   FOREIGN BODY REMOVAL  01/23/2021   Procedure: FOREIGN BODY REMOVAL;  Surgeon: Lorin Glass, MD;  Location: [facility] ENDOSCOPY;  Service: Pulmonary;;   FOREIGN BODY REMOVAL  01/24/2021   Procedure: FOREIGN BODY REMOVAL;  Surgeon: Lorin Glass, MD;  Location: [facility] ENDOSCOPY;  Service: Pulmonary;;   GIVENS CAPSULE STUDY  12/07/2011   Proximal small bowel, rare AVM. Distal small bowel, multiple ulcers noted   GIVENS CAPSULE STUDY N/A 09/27/2013   Distal small bowel ulcers extending to TI.   GIVENS CAPSULE STUDY N/A 10/10/2013   Procedure: GIVENS CAPSULE STUDY;  Surgeon: West Bali, MD;  Location: [facility];  Service: Endoscopy;  Laterality: N/A;  7:30   HEMOSTASIS CONTROL  01/24/2021   Procedure: HEMOSTASIS CONTROL;  Surgeon: Lorin Glass, MD;  Location: [facility] ENDOSCOPY;  Service:  Pulmonary;;   HEMOSTASIS CONTROL  01/26/2021   Procedure: HEMOSTASIS CONTROL;  Surgeon: Lupita Leash, MD;  Location: [facility] ENDOSCOPY;  Service: Cardiopulmonary;;   IR GASTROSTOMY TUBE MOD SED  01/11/2021   IR GASTROSTOMY TUBE REMOVAL  04/13/2021   IR KYPHO THORACIC WITH BONE BIOPSY  02/09/2018   KNEE ARTHROSCOPY WITH MEDIAL MENISECTOMY Right 06/09/2016   Procedure: KNEE ARTHROSCOPY WITH MEDIAL MENISECTOMY;  Surgeon: Vickki Hearing, MD;  Location: [facility] ORS;  Service: Orthopedics;  Laterality: Right;  medial and lateral menisectomy   LAMINECTOMY  1995   L4-L5   LAPAROSCOPIC CHOLECYSTECTOMY  1990s   LEFT HEART CATHETERIZATION WITH CORONARY ANGIOGRAM  01/10/2014   Procedure: LEFT HEART CATHETERIZATION WITH CORONARY ANGIOGRAM;  Surgeon: Lesleigh Noe, MD;  Location: [facility] CATH LAB;  Service: Cardiovascular;;   RIGHT HEART CATHETERIZATION N/A 01/10/2014   Procedure: RIGHT HEART CATH;  Surgeon: Lesleigh Noe, MD;  Location: [facility] CATH LAB;  Service: Cardiovascular;  Laterality: N/A;   TOTAL ABDOMINAL HYSTERECTOMY  1999  TRACHEOSTOMY TUBE PLACEMENT N/A 09/01/2017   Procedure: TRACHEOSTOMY;  Surgeon: Rozetta Nunnery, MD;  Location: [place];  Service: ENT;  Laterality: N/A;   VIDEO BRONCHOSCOPY Bilateral 01/23/2021   Procedure: VIDEO BRONCHOSCOPY WITHOUT FLUORO;  Surgeon: Candee Furbish, MD;  Location: [facility] ENDOSCOPY;  Service: Pulmonary;  Laterality: Bilateral;   VIDEO BRONCHOSCOPY Bilateral 01/24/2021   Procedure: VIDEO BRONCHOSCOPY WITHOUT FLUORO;  Surgeon: Candee Furbish, MD;  Location: [facility] ENDOSCOPY;  Service: Pulmonary;  Laterality: Bilateral;   VIDEO BRONCHOSCOPY N/A 01/26/2021   Procedure: VIDEO BRONCHOSCOPY WITHOUT FLUORO;  Surgeon: Juanito Doom, MD;  Location: [place];  Service: Cardiopulmonary;  Laterality: N/A;    Social History:   reports that she quit smoking about 38 years ago. Her smoking use included cigarettes. She started smoking about 55 years ago. She has a 3.75  pack-year smoking history. She has never used smokeless tobacco. She reports that she does not drink alcohol and does not use drugs.  Allergies  Allergen Reactions   Citalopram Hydrobromide Other (See Comments)    Dyskinesia Other reaction(s): Other (See Comments) Dyskinesia   Codeine Nausea And Vomiting and Other (See Comments)    HALLUCINATIONS Other reaction(s): Unknown Other reaction(s): Other (See Comments) HALLUCINATIONS    Hydromorphone Hcl Other (See Comments)    Made her pass out Other reaction(s): Other (See Comments) Made her pass out   Metoclopramide Other (See Comments)    DYSKINESIA Other reaction(s): Other (See Comments) DYSKINESIA   Hyoscyamine Other (See Comments)    MADE DIARRHEA WORSE    Family History  Problem Relation Age of Onset   Hypertension Mother    Alzheimer's disease Mother    Stroke Mother    Heart attack Mother    Hypertension Other    Breast cancer Sister    Heart disease Neg Hx    Colon cancer Neg Hx      Prior to Admission medications   Medication Sig Start Date End Date Taking? Authorizing Provider  acetaminophen (TYLENOL) 500 MG tablet Place 1 tablet (500 mg total) into feeding tube as needed for moderate pain or headache. 02/15/21   Samella Parr, NP  amiodarone (PACERONE) 200 MG tablet Place 1 tablet (200 mg total) into feeding tube daily. 02/15/21   Samella Parr, NP  apixaban (ELIQUIS) 5 MG TABS tablet Place 1 tablet (5 mg total) into feeding tube 2 (two) times daily. 02/15/21   Samella Parr, NP  atorvastatin (LIPITOR) 80 MG tablet Place 1 tablet (80 mg total) into feeding tube daily. 02/15/21   Samella Parr, NP  dicyclomine (BENTYL) 20 MG tablet TAKE 1 TABLET BY MOUTH UP TO THREE TIMES DAILY BEFORE MEALS AS NEEDED. 05/03/21   Erenest Rasher, PA-C  donepezil (ARICEPT ODT) 5 MG disintegrating tablet Place 1 tablet (5 mg total) into feeding tube at bedtime. 02/15/21   Samella Parr, NP  DULoxetine (CYMBALTA) 30 MG  capsule Take 1 capsule (30 mg total) by mouth 2 (two) times daily. 02/15/21   Samella Parr, NP  feeding supplement (ENSURE ENLIVE / ENSURE PLUS) LIQD Take 237 mLs by mouth 2 (two) times daily between meals. 02/15/21   Samella Parr, NP  fiber (NUTRISOURCE FIBER) PACK packet Place 1 packet into feeding tube 2 (two) times daily. 02/15/21   Samella Parr, NP  furosemide (LASIX) 40 MG tablet Place 1 tablet (40 mg total) into feeding tube daily. 02/15/21   Samella Parr, NP  gabapentin (NEURONTIN) 100 MG  capsule Take 1 capsule (100 mg total) by mouth 3 (three) times daily. 09/21/20   Samella Parr, NP  insulin glargine (LANTUS) 100 UNIT/ML injection Inject 0.2 mLs (20 Units total) into the skin daily. 12/30/20   Orson Eva, MD  ipratropium-albuterol (DUONEB) 0.5-2.5 (3) MG/3ML SOLN Take 3 mLs by nebulization every 4 (four) hours as needed. 02/15/21   Samella Parr, NP  levETIRAcetam (KEPPRA) 100 MG/ML solution Place 10 mLs (1,000 mg total) into feeding tube 2 (two) times daily. 02/15/21   Samella Parr, NP  levothyroxine (SYNTHROID) 175 MCG tablet Place 1 tablet (175 mcg total) into feeding tube daily at 6 (six) AM. 02/16/21   Samella Parr, NP  loperamide HCl (IMODIUM) 1 MG/7.5ML suspension Place 15 mLs (2 mg total) into feeding tube 2 (two) times daily as needed for diarrhea or loose stools. 03/15/21   Mahala Menghini, PA-C  meclizine (ANTIVERT) 12.5 MG tablet Place 1 tablet (12.5 mg total) into feeding tube 3 (three) times daily as needed for dizziness. 02/15/21   Samella Parr, NP  melatonin 3 MG TABS tablet Place 1 tablet (3 mg total) into feeding tube at bedtime as needed. 02/15/21   Samella Parr, NP  metoprolol tartrate (LOPRESSOR) 25 MG tablet Place 1 tablet (25 mg total) into feeding tube 2 (two) times daily. 02/15/21   Samella Parr, NP  Nutritional Supplements (FEEDING SUPPLEMENT, PROSOURCE TF,) liquid Place 45 mLs into feeding tube 3 (three) times daily. 02/15/21   Samella Parr, NP  ondansetron (ZOFRAN) 4 MG tablet Place 1 tablet (4 mg total) into feeding tube 4 (four) times daily as needed for nausea. 02/15/21   Samella Parr, NP  pantoprazole (PROTONIX) 40 MG tablet Take 1 tablet (40 mg total) by mouth 2 (two) times daily before a meal. 02/15/21   Samella Parr, NP  potassium chloride (KLOR-CON) 20 MEQ packet Place 20 mEq into feeding tube daily. 02/15/21   Samella Parr, NP  predniSONE (DELTASONE) 5 MG tablet Take 5 mg by mouth daily. 05/05/21   [provider]  sertraline (ZOLOFT) 25 MG tablet Place 1 tablet (25 mg total) into feeding tube daily. 02/15/21   Samella Parr, NP  SM MELATONIN 3 MG TABS tablet Place 1 tablet (3 mg total) into feeding tube at bedtime as needed. 02/15/21   Samella Parr, NP  Water For Irrigation, Sterile (FREE WATER) SOLN Place 100 mLs into feeding tube every 6 (six) hours. 02/15/21   Samella Parr, NP    Physical Exam: Vitals:   07/18/21 0038 07/18/21 0039 07/18/21 0046 07/18/21 0100  BP:  (!) 151/105  (!) 160/87  Pulse: 66  77 72  Resp: 18  (!) 21 18  Temp:  98.3 F (36.8 C)    TempSrc:  Oral    SpO2: 99%  95% 97%    Constitutional: NAD, calm  Eyes: PERTLA, lids and conjunctivae normal ENMT: Mucous membranes are moist. Posterior pharynx clear of any exudate or lesions.   Neck: supple, no masses  Respiratory: no wheezing, no crackles. No accessory muscle use.  Cardiovascular: Rate ~60, irregular. No extremity edema.   Abdomen: No distension, no tenderness, soft. Bowel sounds active.  Musculoskeletal: no clubbing / cyanosis. No joint deformity upper and lower extremities.   Skin: no significant rashes, lesions, ulcers. Warm, dry, well-perfused. Neurologic: CN 2-12 grossly intact. Moving all extremities. Alert and oriented.  Psychiatric: Pleasant. Cooperative.    Labs and Imaging  on Admission: I have personally reviewed following labs and imaging studies  CBC: Recent Labs  Lab 07/18/21 0247   WBC 8.3  NEUTROABS 4.5  HGB 10.0*  HCT 32.0*  MCV 94.7  PLT 123XX123   Basic Metabolic Panel: Recent Labs  Lab 07/18/21 0247  NA 137  K 4.1  CL 110  CO2 21*  GLUCOSE 291*  BUN 23  CREATININE 1.24*  CALCIUM 9.0   GFR: CrCl cannot be calculated (Unknown ideal weight.). Liver Function Tests: No results for input(s): AST, ALT, ALKPHOS, BILITOT, PROT, ALBUMIN in the last 168 hours. No results for input(s): LIPASE, AMYLASE in the last 168 hours. No results for input(s): AMMONIA in the last 168 hours. Coagulation Profile: Recent Labs  Lab 07/18/21 0247  INR 1.3*   Cardiac Enzymes: No results for input(s): CKTOTAL, CKMB, CKMBINDEX, TROPONINI in the last 168 hours. BNP (last 3 results) No results for input(s): PROBNP in the last 8760 hours. HbA1C: No results for input(s): HGBA1C in the last 72 hours. CBG: No results for input(s): GLUCAP in the last 168 hours. Lipid Profile: No results for input(s): CHOL, HDL, LDLCALC, TRIG, CHOLHDL, LDLDIRECT in the last 72 hours. Thyroid Function Tests: No results for input(s): TSH, T4TOTAL, FREET4, T3FREE, THYROIDAB in the last 72 hours. Anemia Panel: No results for input(s): VITAMINB12, FOLATE, FERRITIN, TIBC, IRON, RETICCTPCT in the last 72 hours. Urine analysis:    Component Value Date/Time   COLORURINE YELLOW 01/03/2021 2044   APPEARANCEUR HAZY (A) 01/03/2021 2044   LABSPEC 1.020 01/03/2021 2044   PHURINE 6.0 01/03/2021 2044   GLUCOSEU 50 (A) 01/03/2021 2044   HGBUR SMALL (A) 01/03/2021 2044   BILIRUBINUR NEGATIVE 01/03/2021 2044   KETONESUR 80 (A) 01/03/2021 2044   PROTEINUR NEGATIVE 01/03/2021 2044   UROBILINOGEN 0.2 02/05/2015 1700   NITRITE NEGATIVE 01/03/2021 2044   LEUKOCYTESUR SMALL (A) 01/03/2021 2044   Sepsis Labs: @LABRCNTIP (procalcitonin:4,lacticidven:4) ) Recent Results (from the past 240 hour(s))  Resp Panel by RT-PCR (Flu A&B, Covid) Nasopharyngeal Swab     Status: None   Collection Time: 07/18/21  2:43 AM    Specimen: Nasopharyngeal Swab; Nasopharyngeal(NP) swabs in vial transport medium  Result Value Ref Range Status   SARS Coronavirus 2 by RT PCR NEGATIVE NEGATIVE Final    Comment: (NOTE) SARS-CoV-2 target nucleic acids are NOT DETECTED.  The SARS-CoV-2 RNA is generally detectable in upper respiratory specimens during the acute phase of infection. The lowest concentration of SARS-CoV-2 viral copies this assay can detect is 138 copies/mL. A negative result does not preclude SARS-Cov-2 infection and should not be used as the sole basis for treatment or other patient management decisions. A negative result may occur with  improper specimen collection/handling, submission of specimen other than nasopharyngeal swab, presence of viral mutation(s) within the areas targeted by this assay, and inadequate number of viral copies(<138 copies/mL). A negative result must be combined with clinical observations, patient history, and epidemiological information. The expected result is Negative.  Fact Sheet for Patients:  EntrepreneurPulse.com.au  Fact Sheet for Healthcare Providers:  IncredibleEmployment.be  This test is no t yet approved or cleared by the [place] FDA and  has been authorized for detection and/or diagnosis of SARS-CoV-2 by FDA under an Emergency Use Authorization (EUA). This EUA will remain  in effect (meaning this test can be used) for the duration of the COVID-19 declaration under Section 564(b)(1) of the Act, 21 U.S.C.section 360bbb-3(b)(1), unless the authorization is terminated  or revoked sooner.  Influenza A by PCR NEGATIVE NEGATIVE Final   Influenza B by PCR NEGATIVE NEGATIVE Final    Comment: (NOTE) The Xpert Xpress SARS-CoV-2/FLU/RSV plus assay is intended as an aid in the diagnosis of influenza from Nasopharyngeal swab specimens and should not be used as a sole basis for treatment. Nasal washings and aspirates are  unacceptable for Xpert Xpress SARS-CoV-2/FLU/RSV testing.  Fact Sheet for Patients: EntrepreneurPulse.com.au  Fact Sheet for Healthcare Providers: IncredibleEmployment.be  This test is not yet approved or cleared by the [place] FDA and has been authorized for detection and/or diagnosis of SARS-CoV-2 by FDA under an Emergency Use Authorization (EUA). This EUA will remain in effect (meaning this test can be used) for the duration of the COVID-19 declaration under Section 564(b)(1) of the Act, 21 U.S.C. section 360bbb-3(b)(1), unless the authorization is terminated or revoked.  Performed at [facility], [place] [address] [place], [place] [address]      Radiological Exams on Admission: No results found.   Assessment/Plan   1. Tracheostomy complication; OSA; chronic hypoxic and hypercarbic respiratory failure  - Pt with OSA and BiPAP intolerance, chronic hypoxic and hypercarbic resp failure on ventilator at home presents after trach could not be inserted during routine exchange  - Appreciate PCCM consultation, stoma occluded, pt a little anxious but appears stable from respiratory standpoint, may need revision    2. Atrial fibrillation  - Continue amiodarone and metoprolol, hold Eliquis pending plan regarding tracheostomy (last dose was pm of 07/17/21)    3. Renal insufficiency  - SCr is 1.24 on admission, up from 0.9 in August  - Hold Lasix while NPO, monitor    4. HFpEF   - EF was 45-50% in July 2022  - Appears compensated  - Hold Lasix while NPO in light of increased creatinine, continue beta-blocker, monitor volume status    5. Depression, anxiety  - Continue Cymbalta, Zoloft, low-dose Ativan as-needed   6. Dementia  - Continue Aricept, use delirium precautions in hospital    7. Seizure disorder  - Continue Keppra   8. Insulin-dependent DM  - A1c was 7.6% in May 2022  - Continue CBG checks and insulin     DVT  prophylaxis: SCD, Eliquis pta (last dose PM of 07/17/21)  Code Status: Full  Level of Care: Level of care: Progressive Family Communication: daughter at baseline  Disposition Plan:  Patient is from: home  Anticipated d/c is to: home  Anticipated d/c date is: TBD Patient currently: pending plan regarding trach  Consults called: PCCM  Admission status: Observation     Vianne Bulls, MD [facility]  07/18/2021, 4:42 AM

## 2021-07-18 NOTE — Anesthesia Preprocedure Evaluation (Addendum)
Anesthesia Evaluation  Patient identified by MRN, date of birth, ID band Patient awake    Reviewed: Allergy & Precautions, H&P , NPO status , Patient's Chart, lab work & pertinent test results  Airway Mallampati: II  TM Distance: >3 FB Neck ROM: Full    Dental  (+) Dental Advisory Given, Edentulous Upper, Missing, Poor Dentition   Pulmonary shortness of breath, with exertion and Long-Term Oxygen Therapy, sleep apnea , pneumonia, COPD, former smoker,  (-) CPAP   Pulmonary exam normal breath sounds clear to auscultation       Cardiovascular hypertension, Pt. on medications (-) angina+ Past MI, +CHF ( Chronic diastolic CHF and pulm htn) and + DOE  + dysrhythmias (s/p DCCV) Atrial Fibrillation + Valvular Problems/Murmurs MR  Rhythm:Irregular Rate:Normal  Echo 01/2021 1. Left ventricular ejection fraction, by estimation, is 45 to 50%. Left ventricular ejection fraction by PLAX is 46 %. The left ventricle has mildly decreased function. The left ventricle demonstrates global hypokinesis. There is moderate concentric left ventricular hypertrophy. Left ventricular diastolic parameters are indeterminate.  2. Right ventricular systolic function is moderately reduced. The right ventricular size is normal. There is severely elevated pulmonary artery systolic pressure. The estimated right ventricular systolic pressure is 60.1 mmHg.  3. Tricuspid valve regurgitation is at least moderate.  4. The inferior vena cava is normal in size with <50% respiratory variability, suggesting right atrial pressure of 8 mmHg.  5. The mitral valve is abnormal. Mild to moderate mitral valve regurgitation, with blunting of systolic pulmonary vein flow. No evidence of mitral stenosis. Severe mitral annular calcification.  6. Left atrial size was severely dilated.  7. The aortic valve is tricuspid. There is mild calcification of the aortic valve. Aortic valve  regurgitation is not visualized. No aortic stenosis is present.    Neuro/Psych Seizures -,  PSYCHIATRIC DISORDERS Anxiety Depression Dementia claustrophobia Last seizure 6 years ago (+Keppra) CVA    GI/Hepatic hiatal hernia, GERD  Medicated,  Endo/Other  diabetes, Well Controlled, Type 2, Insulin DependentHypothyroidism   Renal/GU Renal disease     Musculoskeletal  (+) Arthritis , Fibromyalgia -  Abdominal   Peds  Hematology  (+) anemia ,   Anesthesia Other Findings RA sat=85% on arrival to preop.  Reproductive/Obstetrics                           Anesthesia Physical  Anesthesia Plan  ASA: 4  Anesthesia Plan: General   Post-op Pain Management: Minimal or no pain anticipated   Induction: Intravenous  PONV Risk Score and Plan: 3 and Treatment may vary due to age or medical condition, Ondansetron and Dexamethasone  Airway Management Planned: Oral ETT and Video Laryngoscope Planned  Additional Equipment: None  Intra-op Plan:   Post-operative Plan: Possible Post-op intubation/ventilation  Informed Consent: I have reviewed the patients History and Physical, chart, labs and discussed the procedure including the risks, benefits and alternatives for the proposed anesthesia with the patient or authorized representative who has indicated his/her understanding and acceptance.     Dental advisory given  Plan Discussed with: CRNA  Anesthesia Plan Comments: (  )       Anesthesia Quick Evaluation

## 2021-07-18 NOTE — Op Note (Signed)
Preop/postop diagnosis: Trach dependence Procedure: Tracheotomy Estimated blood loss: Approximately 20 cc Anesthesia: General Indications: This is a 71 year old with a trach since July of last year.  She had 1 prior to that in 2019.  She now has dislodged the trach and the stoma has just about closed.  She is here for replacement of the tracheotomy.  She is on a home vent so she needs a cuffed trach.  Risk, benefits, and options were discussed all questions were answered and consent was obtained. Procedure: Patient was taken the operating placed supine position after general endotracheal tube anesthesia prepped and draped in the usual sterile manner.  The granulation tissue on the surface of the stoma was removed.  A urethral sound at 26 was placed and was inserted into the trachea.  I could feel the endotracheal tube.  She was dilated up to 30.  Once that the dilator for the trachea was placed into the wound and the trachea was opened.  More granulation tissue with suction cautery through the stoma.  I can now see the endotracheal tube.  The endotracheal tube was backed up and a #6 cuffed Shiley was placed without difficulty.  It was secured with a trach collar.  There was good end-tidal CO2 return.  There is good hemostasis.  The patient was awakened brought to recovery in stable condition counts correct

## 2021-07-18 NOTE — Progress Notes (Signed)
PROGRESS NOTE  [name]  DOB: 1950/12/04  PCP: Lemmie Evens, MD [id number]  DOA: 07/18/2021  LOS: 0 days  Hospital Day: 1  Chief Complaint  Patient presents with   Lurline Idol Issues   Brief narrative: [name] is a 71 y.o. female with PMH significant for DM2, HTN, chronic systolic CHF, A. fib on Eliquis, OSA/OHS, seizures, dementia, anxiety/depression, chronic hypoxic respiratory failure on home oxygen, BiPAP intolerance, vent dependent at night Patient was brought to the ED late last night after which tracheostomy tube could not be reinserted on routine exchange.  Patient's daughter was performing nightly tracheostomy care, cannula fell out, she had some bleeding.  EMS was called and patient was brought to the ED on nasal cannula.  In the ED, patient was afebrile, oxygen saturation more than 90 on 4 L by nasal cannula.  Hemodynamically stable Labs with glucose 291, creatinine 1.24.  CBC unremarkable.  PCCM saw the patient in the ED.  Per their note, patient's trach stoma looked completely occluded, closed to sealed.  But she was not in respiratory distress.  It was deemed very risky to push the tracheostomy. Recommendation was for revision of the tracheostomy in the near future.  Subjective: Patient was seen and examined this morning.  Pleasant elderly Caucasian female.  Lying on bed.  Little anxious.  Breathing comfortably on 2 to 3 L oxygen.  Has a closed tracheostomy tract on anterior neck.  Daughter at bedside. Chart reviewed Blood pressure elevated to 170s Glucose elevated to 220 this morning  Assessment/Plan: Tracheostomy complication -Patient had a tracheostomy tube and uses ventilator at night -Brought into the hospital after tube dislodged. -On exam by PCCM, tracheostomy stoma was occluded and closed to sealed -Respiratory status stable on 2-3 lpm -I discussed with ENT Dr. Janace Hoard this morning.  Patient may need bedside versus OR procedure to open up the tract  again.  OSA Chronic hypoxic and hypercarbic respiratory failure BiPAP intolerance -On nasal cannula during the day and was on ventilator through trach at night.   -Unable to continue ventilator at this time because of stoma closed   Atrial fibrillation  -Continue amiodarone and metoprolol, Eliquis on hold for potential trach revision.   AKI on CKD2 -Serum creatinine 0.93 from August.  Was 1.24 on admission.  Improving now.  Lasix on hold. Recent Labs    02/06/21 0310 02/08/21 0434 02/09/21 1246 02/10/21 0156 02/11/21 0301 02/12/21 0459 02/13/21 0149 02/14/21 0141 07/18/21 0247 07/18/21 0503  BUN 27* 29* 25* 27* 25* 24* 26* 26* 23 22  CREATININE 0.79 0.81 0.92 0.82 0.79 0.78 0.85 0.93 1.24* 1.09*   HFpEF   -EF was 45-50% in July 2022  -Appears compensated  -Hold Lasix while NPO in light of increased creatinine. -continue beta-blocker, monitor volume status    Type 2 diabetes mellitus with hyperglycemia -A1c 7.6 on 11/22/2020 -Blood sugar level elevated to over 200 this morning -Home meds include Lantus 20 units at bedtime.  Received only 7 units last night.  Blood sugar level running elevated over 200 this morning.  Increase Lantus to 15 units for tonight. -Continue sliding scale insulin with Accu-Cheks Recent Labs  Lab 07/18/21 0500 07/18/21 0754  GLUCAP 235* 220*   Depression, anxiety  -Continue Cymbalta, Zoloft, low-dose Ativan as-needed    Dementia  -Continue Aricept, use delirium precautions in hospital     Seizure disorder  -Continue Keppra    Mobility: Encourage ambulation Living condition: Lives at home with daughter Goals of care:  Code Status: Full Code  Nutritional status: There is no height or weight on file to calculate BMI.      Diet:  Diet Order             Diet NPO time specified Except for: Ice Chips, Sips with Meds  Diet effective now                  DVT prophylaxis:  SCDs Start: 07/18/21 0436   Antimicrobials: None Fluid:  None Consultants: PCCM, ENT Family Communication: Daughter at bedside  Status is: Observation  Continue in-hospital care because: Needs tracheostomy tract open up Level of care: Progressive   Dispo: The patient is from: Home              Anticipated d/c is to: Home              Patient currently is not medically stable to d/c.   Difficult to place patient No     Infusions:    Scheduled Meds:  amiodarone  200 mg Oral Daily   atorvastatin  80 mg Oral Daily   donepezil  5 mg Oral QHS   DULoxetine  30 mg Oral Daily   gabapentin  100 mg Oral TID   insulin aspart  0-9 Units Subcutaneous Q4H   insulin glargine-yfgn  15 Units Subcutaneous QHS   levETIRAcetam  500 mg Oral BID   levothyroxine  175 mcg Oral Q0600   metoprolol tartrate  25 mg Oral BID   pantoprazole  40 mg Oral BID AC   predniSONE  5 mg Oral QODAY   sertraline  25 mg Oral Daily   sodium chloride flush  3 mL Intravenous Q12H    PRN meds: acetaminophen **OR** acetaminophen, ipratropium-albuterol, LORazepam, melatonin   Antimicrobials: Anti-infectives (From admission, onward)    None       Objective: Vitals:   07/18/21 1011 07/18/21 1015  BP: (!) 204/124 (!) 203/153  Pulse: 100 96  Resp: 14 18  Temp:    SpO2: 94% 92%   No intake or output data in the 24 hours ending 07/18/21 1033 There were no vitals filed for this visit. Weight change:  There is no height or weight on file to calculate BMI.   Physical Exam: General exam: Pleasant, elderly Caucasian female.  Not in physical distress Skin: No rashes, lesions or ulcers. HEENT: Atraumatic, normocephalic, no obvious bleeding.  Tracheostomy tract anteriorly Lungs: Clear to auscultation bilaterally CVS: Regular rate and rhythm, no murmur GI/Abd soft, nontender, nondistended, bowel sound present CNS: Alert, awake, oriented x3 Psychiatry: Anxious Extremities: No pedal edema, no calf tenderness  Data Review: I have personally reviewed the laboratory  data and studies available.  F/u labs ordered Unresulted Labs (From admission, onward)     Start     Ordered   07/18/21 [id number]  Basic metabolic panel  Daily,   R      07/18/21 0437   07/18/21 0500  CBC  Daily,   R      07/18/21 0437            Signed, Terrilee Croak, MD [facility] 07/18/2021

## 2021-07-18 NOTE — Transfer of Care (Signed)
Immediate Anesthesia Transfer of Care Note  Patient: [name]  Procedure(s) Performed: TRACHEOSTOMY REVISION  Patient Location: PACU  Anesthesia Type:General  Level of Consciousness: awake, alert  and oriented  Airway & Oxygen Therapy: Patient Spontanous Breathing and Patient connected to face mask oxygen  Post-op Assessment: Report given to RN and Post -op Vital signs reviewed and stable  Post vital signs: Reviewed and stable  Last Vitals:  Vitals Value Taken Time  BP    Temp    Pulse 61 07/18/21 1322  Resp 21 07/18/21 1322  SpO2 99 % 07/18/21 1322  Vitals shown include unvalidated device data.  Last Pain:  Vitals:   07/18/21 1147  TempSrc:   PainSc: 0-No pain         Complications: No notable events documented.

## 2021-07-18 NOTE — ED Provider Notes (Signed)
Emergency Department Provider Note   I have reviewed the triage vital signs and the nursing notes.   HISTORY  Chief Complaint Trach Issues   HPI [name] is a 71 y.o. female with PMH reviewed including chronic respiratory failure with trach and night-time vent dependence presents after her trach became dislodged at home during routine care. Patient denies any distress, SOB, or CP. She notes there was a small amount of bleeding at the time but that has resolved. Patient recalls that the trach has been changed 7-8 times previously without complication since initial placement in July 2022.   The patient's daughter arrives and states that she removed the trach for routine exchange as she had been taught but when she went to try and put the trach back in and she had significant difficulty along with some mild bleeding.  She realized the trach was not can to go back and is ultimately called for help.    Past Medical History:  Diagnosis Date   Abnormal pulmonary function test    Anemia    H/H of 10/30 with a normal MCV in 12/09   Anxiety    Arthritis    Barrett's esophagus    Diagnosed 1995. Last EGD 2016-NO BARRETT'S.    Chest pain    a. 2022 Cath: nl cors; b. 2008 Neg myoview; c. 01/2014 Cath: Nl cors. EF 40%; c. 08/2020 NSTEMI/Cath [facility]): nl cors.   Chronic anticoagulation    Chronic combined systolic and diastolic CHF (congestive heart failure) (HCC)    a. 10/2014 Echo: EF40%; b. 01/2015 Echo: EF 55-60%; c. 11/2020 Echo: EF 55-60%, mod LVH, sev BAE, mild MR, mod TR. PASP ; d. 01/2021 Echo: EF 45-50%, glob HK, mod conc LVH, mod reduced RV fxn, RVSP 60.21mmHg, Mod TR, mild to mod MR, sev dil LA.   Chronic LBP    Surgical intervention in 1996   Diabetes mellitus, type 2 (HCC)    Insulin therapy; exacerbated by prednisone   Gastroparesis    99% retention 05/2008 on GES   GERD (gastroesophageal reflux disease)    Heart attack (HCC) 08/13/2020   Hiatal hernia    Hyperlipidemia     Hypertension    Hypothyroid    IBS (irritable bowel syndrome)    Obesity    OSA on CPAP    had CPAP and cannot tolerate.   Paroxysmal atrial fibrillation (HCC)    a.CHA2DS2VASc = 6-->eliquis. Also on Amio.   Pulmonary hypertension (HCC) 01/2015   a. 01/2015 RHC: Predominantly pulmonary venous hypertension but may be component of PAH (PA mean 52, PCWP 31); b. 01/2021 Echo: RVSP 60.60mmHg w/ mod reduced RV fxn.   Seizures (HCC)    last seizure was 2 years ago; on keppra for this; unknown etiology   Syncope    a. Admitted 05/2009; magnetic resonance imagin/ MRA - negative; etiology thought to be orthostasis secondary to drugs and dehydration. b. Syncope 02/2015 also felt 2/2 dehydration.    Review of Systems  Constitutional: No fever/chills Eyes: No visual changes. ENT: Dislodged trach.  Cardiovascular: Denies chest pain. Respiratory: Denies shortness of breath.  Gastrointestinal: No abdominal pain.  No nausea, no vomiting.   ____________________________________________   PHYSICAL EXAM:  VITAL SIGNS: ED Triage Vitals  Enc Vitals Group     BP 07/18/21 0039 (!) 151/105     Pulse Rate 07/18/21 0032 85     Resp 07/18/21 0032 14     Temp 07/18/21 0039 98.3 F (36.8 C)  Temp Source 07/18/21 0039 Oral     SpO2 07/18/21 0032 98 %   Constitutional: Alert and oriented. Well appearing and in no acute distress. Eyes: Conjunctivae are normal.  Head: Atraumatic. Nose: No congestion/rhinnorhea. Mouth/Throat: Mucous membranes are moist.  Oropharynx non-erythematous. Neck: No stridor. Tracheostomy noted without active bleeding.  There is some tissue surrounding the stoma which appears inflamed.  Cardiovascular: Normal rate, regular rhythm.  Respiratory: Normal respiratory effort.  No retractions. Lungs CTAB. Gastrointestinal: No distention.  Musculoskeletal: No lower extremity tenderness nor edema.  Neurologic:  Normal speech and language.  Skin:  Skin is warm, dry and intact. No  rash noted.  ____________________________________________   PROCEDURES  Procedure(s) performed:   Procedures  None ____________________________________________   INITIAL IMPRESSION / ASSESSMENT AND PLAN / ED COURSE  Pertinent labs & imaging results that were available during my care of the patient were reviewed by me and considered in my medical decision making (see chart for details).   This patient is Presenting for Evaluation of trach dislodged, which does require a range of treatment options, and is a complaint that involves a moderate risk of morbidity and mortality.   Critical Interventions- Labs and immediate evaluation for re-cannulation of trach site.    Reassessment after intervention: Inflammation noted and stoma appears closed.    I did obtain Additional Historical Information from daughter who arrives with trach supplies from home confirming the size of the trach.   I decided to review pertinent External Data, and in summary patient with Mikalyn Hermida admit in July with multifactorial respiratory failure, trach placed at that time.    Clinical Laboratory Tests Ordered, included CBC without anemia. No AKI. Normal electrolytes. Hyperglycemia but no DKA. COVID and Flu negative.   Radiologic Tests: Considered chest and or neck imaging. Patient in no distress and without vital sign abnormality. Plan to defer for now.   Cardiac Monitor Tracing which shows NSR.    Social Determinants of Health Risk family at bedside with good follow up and assistance at home.   Reevaluation with update and discussion with patient and daughter. No acute distress. ICU has seen and recommendations as below. Will admit.   Consult complete with Critical Care. They will evaluate the trach site but likely will need admit for revision. Will obtain labs and IV for likely admit to [facility]. Spoke with Dr. Ilda Mori.   Disposition: admit  ____________________________________________  FINAL CLINICAL  IMPRESSION(S) / ED DIAGNOSES  Final diagnoses:  Tracheostomy malfunction ([place])     MEDICATIONS GIVEN DURING THIS VISIT:  Medications  amiodarone (PACERONE) tablet 200 mg (has no administration in time range)  atorvastatin (LIPITOR) tablet 80 mg (has no administration in time range)  metoprolol tartrate (LOPRESSOR) tablet 25 mg (has no administration in time range)  donepezil (ARICEPT) tablet 5 mg (has no administration in time range)  DULoxetine (CYMBALTA) DR capsule 30 mg (has no administration in time range)  sertraline (ZOLOFT) tablet 25 mg (has no administration in time range)  levothyroxine (SYNTHROID) tablet 175 mcg (has no administration in time range)  predniSONE (DELTASONE) tablet 5 mg (has no administration in time range)  pantoprazole (PROTONIX) EC tablet 40 mg (has no administration in time range)  melatonin tablet 3 mg (has no administration in time range)  levETIRAcetam (KEPPRA) tablet 500 mg (has no administration in time range)  gabapentin (NEURONTIN) capsule 100 mg (has no administration in time range)  ipratropium-albuterol (DUONEB) 0.5-2.5 (3) MG/3ML nebulizer solution 3 mL (has no administration in time range)  insulin aspart (novoLOG) injection 0-9 Units (has no administration in time range)  insulin glargine-yfgn (SEMGLEE) injection 7 Units (has no administration in time range)  sodium chloride flush (NS) 0.9 % injection 3 mL (has no administration in time range)  LORazepam (ATIVAN) tablet 0.5 mg (0.5 mg Oral Not Given 07/18/21 0501)  acetaminophen (TYLENOL) tablet 650 mg (650 mg Oral Given 07/18/21 0450)    Or  acetaminophen (TYLENOL) suppository 650 mg ( Rectal See Alternative 07/18/21 0450)     Note:  This document was prepared using Dragon voice recognition software and may include unintentional dictation errors.  Nanda Quinton, MD, [facility] Emergency [facility]    Aashvi Rezabek, [facility], MD 07/18/21 [id number]

## 2021-07-18 NOTE — Consult Note (Signed)
Reason for Consult: Trach dislodgment Referring Physician: Hospitalist  [name] is an 71 y.o. female.  HPI: History of tracheotomy for CO2 retention and or nighttime ventilator.  She had a trach in 2019 and then another 1 by pulmonary supposedly in July of this past year.  She now had dislodgment of the trach yesterday at 10:30 at night and is here for replacement.  Pulmonary says the tract is closed.  She has a little bit of anxiety and slight sound of a stridorous sound so operating room is the best place for this procedure.  Past Medical History:  Diagnosis Date   Abnormal pulmonary function test    Anemia    H/H of 10/30 with a normal MCV in 12/09   Anxiety    Arthritis    Barrett's esophagus    Diagnosed 1995. Last EGD 2016-NO BARRETT'S.    Chest pain    a. 2022 Cath: nl cors; b. 2008 Neg myoview; c. 01/2014 Cath: Nl cors. EF 40%; c. 08/2020 NSTEMI/[facility]): nl cors.   Chronic anticoagulation    Chronic combined systolic and diastolic CHF (congestive heart failure) ([place])    a. 10/2014 Echo: EF40%; b. 01/2015 Echo: EF 55-60%; c. 11/2020 Echo: EF 55-60%, mod LVH, sev BAE, mild MR, mod TR. PASP 26mmHg; d. 01/2021 Echo: EF 45-50%, glob HK, mod conc LVH, mod reduced RV fxn, RVSP 60.74mmHg, Mod TR, mild to mod MR, sev dil LA.   Chronic LBP    Surgical intervention in 1996   Diabetes mellitus, type 2 ([place])    Insulin therapy; exacerbated by prednisone   Gastroparesis    99% retention 05/2008 on GES   GERD (gastroesophageal reflux disease)    Heart attack ([place]) 08/13/2020   Hiatal hernia    Hyperlipidemia    Hypertension    Hypothyroid    IBS (irritable bowel syndrome)    Obesity    OSA on CPAP    had CPAP and cannot tolerate.   Paroxysmal atrial fibrillation (HCC)    a.CHA2DS2VASc = 6-->eliquis. Also on Amio.   Pulmonary hypertension ([place]) 01/2015   a. 01/2015 RHC: Predominantly pulmonary venous hypertension but may be component of PAH (PA mean 52, PCWP 31); b. 01/2021 Echo: RVSP  60.41mmHg w/ mod reduced RV fxn.   Seizures ([place])    last seizure was 2 years ago; on keppra for this; unknown etiology   Syncope    a. Admitted 05/2009; magnetic resonance imagin/ MRA - negative; etiology thought to be orthostasis secondary to drugs and dehydration. b. Syncope 02/2015 also felt 2/2 dehydration.    Past Surgical History:  Procedure Laterality Date   BACK SURGERY  1996   BIOPSY N/A 11/08/2013   Procedure: BIOPSY  / Tissue sampling / ulcers present in small intestine;  Surgeon: Danie Binder, MD;  [facility];  Service: Endoscopy;  Laterality: N/A;   CARDIAC CATHETERIZATION  2002   CARDIAC CATHETERIZATION N/A 01/26/2015   Procedure: Right Heart Cath;  Surgeon: Larey Dresser, MD;  Location: [place] [facility];  Service: Cardiovascular;  Laterality: N/A;   CARDIOVASCULAR STRESS TEST  2008   Stress nuclear study   CARDIOVERSION N/A 03/06/2015   Procedure: CARDIOVERSION;  Surgeon: Larey Dresser, MD;  Location: [place];  Service: Cardiovascular;  Laterality: N/A;   CARPAL TUNNEL RELEASE  1994   COLONOSCOPY  11/2011   Dr. Oneida Alar: Internal hemorrhoids, mild diverticulosis. Random colon biopsies negative.   COLONOSCOPY N/A 11/08/2013   SLF: Normal  mucosa in the terminal ileum/The colon IS redundant/  Moderate diverticulosis throughout the entire colon. ileum bx benign. colon bx benign   CRYOTHERAPY  01/23/2021   Procedure: CRYOTHERAPY;  Surgeon: Candee Furbish, MD;  Location: [facility] ENDOSCOPY;  Service: Pulmonary;;   CRYOTHERAPY  01/24/2021   Procedure: Cydney Ok;  Surgeon: Candee Furbish, MD;  Location[facility] ENDOSCOPY;  Service: Pulmonary;;   CRYOTHERAPY  01/26/2021   Procedure: Cydney Ok;  Surgeon: Juanito Doom, MD;  Location: [facility] ENDOSCOPY;  Service: Cardiopulmonary;;   ESOPHAGOGASTRODUODENOSCOPY  2008   Barrett's without dysplasia. esphagus dilated. antral erosions, h.pylori serologies negative.   ESOPHAGOGASTRODUODENOSCOPY  11/2011   Dr. Oneida Alar: Barrett's  esophagus, mild gastritis, diverticulum in the second portion of the duodenum repeat EGD 3 years. Small bowel biopsies negative. Gastric biopsy show reactive gastropathy but no H. pylori. Esophageal biopsies consistent with GERD. Next EGD 11/2014   ESOPHAGOGASTRODUODENOSCOPY N/A 11/21/2014   [id number] non-erosive gastritis/irregular z-line   FOREIGN BODY REMOVAL  01/23/2021   Procedure: FOREIGN BODY REMOVAL;  Surgeon: Candee Furbish, MD;  Location: [place];  Service: Pulmonary;;   FOREIGN BODY REMOVAL  01/24/2021   Procedure: FOREIGN BODY REMOVAL;  Surgeon: Candee Furbish, MD;  Location: [facility] ENDOSCOPY;  Service: Pulmonary;;   GIVENS CAPSULE STUDY  12/07/2011   Proximal small bowel, rare AVM. Distal small bowel, multiple ulcers noted   GIVENS CAPSULE STUDY N/A 09/27/2013   Distal small bowel ulcers extending to TI.   GIVENS CAPSULE STUDY N/A 10/10/2013   Procedure: GIVENS CAPSULE STUDY;  Surgeon: Danie Binder, MD;  [facility];  Service: Endoscopy;  Laterality: N/A;  7:30   HEMOSTASIS CONTROL  01/24/2021   Procedure: HEMOSTASIS CONTROL;  Surgeon: Candee Furbish, MD;  Location: [facility] ENDOSCOPY;  Service: Pulmonary;;   HEMOSTASIS CONTROL  01/26/2021   Procedure: HEMOSTASIS CONTROL;  Surgeon: Juanito Doom, MD;  Location[facility]COPY;  Service: Cardiopulmonary;;   IR GASTROSTOMY TUBE MOD SED  01/11/2021   IR GASTROSTOMY TUBE REMOVAL  04/13/2021   IR KYPHO THORACIC WITH BONE BIOPSY  02/09/2018   KNEE ARTHROSCOPY WITH MEDIAL MENISECTOMY Right 06/09/2016   Procedure: KNEE ARTHROSCOPY WITH MEDIAL MENISECTOMY;  Surgeon: Carole Civil, MD;  Location[facility];  Service: Orthopedics;  Laterality: Right;  medial and lateral menisectomy   LAMINECTOMY  1995   L4-L5   LAPAROSCOPIC CHOLECYSTECTOMY  1990s   LEFT HEART CATHETERIZATION WITH CORONARY ANGIOGRAM  01/10/2014   Procedure: LEFT HEART CATHETERIZATION WITH CORONARY ANGIOGRAM;  Surgeon: Sinclair Grooms, MD;  Location[facility] CATH LAB;   Service: Cardiovascular;;   RIGHT HEART CATHETERIZATION N/A 01/10/2014   Procedure: RIGHT HEART CATH;  Surgeon: Sinclair Grooms, MD;  Location: [facility] CATH LAB;  Service: Cardiovascular;  Laterality: N/A;   TOTAL ABDOMINAL HYSTERECTOMY  1999   TRACHEOSTOMY TUBE PLACEMENT N/A 09/01/2017   Procedure: TRACHEOSTOMY;  Surgeon: Rozetta Nunnery, MD;  Location: [place];  Service: ENT;  Laterality: N/A;   VIDEO BRONCHOSCOPY Bilateral 01/23/2021   Procedure: VIDEO BRONCHOSCOPY WITHOUT FLUORO;  Surgeon: Candee Furbish, MD;  Location: [facility] ENDOSCOPY;  Service: Pulmonary;  Laterality: Bilateral;   VIDEO BRONCHOSCOPY Bilateral 01/24/2021   Procedure: VIDEO BRONCHOSCOPY WITHOUT FLUORO;  Surgeon: Candee Furbish, MD;  Location: [facility] ENDOSCOPY;  Service: Pulmonary;  Laterality: Bilateral;   VIDEO BRONCHOSCOPY N/A 01/26/2021   Procedure: VIDEO BRONCHOSCOPY WITHOUT FLUORO;  Surgeon: Juanito Doom, MD;  Location: [place];  Service: Cardiopulmonary;  Laterality: N/A;    Family  History  Problem Relation Age of Onset   Hypertension Mother    Alzheimer's disease Mother    Stroke Mother    Heart attack Mother    Hypertension Other    Breast cancer Sister    Heart disease Neg Hx    Colon cancer Neg Hx     Social History:  reports that she quit smoking about 38 years ago. Her smoking use included cigarettes. She started smoking about 55 years ago. She has a 3.75 pack-year smoking history. She has never used smokeless tobacco. She reports that she does not drink alcohol and does not use drugs.  Allergies:  Allergies  Allergen Reactions   Citalopram Hydrobromide Other (See Comments)    Dyskinesia Other reaction(s): Other (See Comments) Dyskinesia   Codeine Nausea And Vomiting and Other (See Comments)    HALLUCINATIONS Other reaction(s): Unknown Other reaction(s): Other (See Comments) HALLUCINATIONS    Hydromorphone Hcl Other (See Comments)    Made her pass out Other reaction(s): Other (See  Comments) Made her pass out   Metoclopramide Other (See Comments)    DYSKINESIA Other reaction(s): Other (See Comments) DYSKINESIA   Hyoscyamine Other (See Comments)    MADE DIARRHEA WORSE    Medications: I have reviewed the patient's current medications.  Results for orders placed or performed during the hospital encounter of 07/18/21 (from the past 48 hour(s))  Resp Panel by RT-PCR (Flu A&B, Covid) Nasopharyngeal Swab     Status: None   Collection Time: 07/18/21  2:43 AM   Specimen: Nasopharyngeal Swab; Nasopharyngeal(NP) swabs in vial transport medium  Result Value Ref Range   SARS Coronavirus 2 by RT PCR NEGATIVE NEGATIVE    Comment: (NOTE) SARS-CoV-2 target nucleic acids are NOT DETECTED.  The SARS-CoV-2 RNA is generally detectable in upper respiratory specimens during the acute phase of infection. The lowest concentration of SARS-CoV-2 viral copies this assay can detect is 138 copies/mL. A negative result does not preclude SARS-Cov-2 infection and should not be used as the sole basis for treatment or other patient management decisions. A negative result may occur with  improper specimen collection/handling, submission of specimen other than nasopharyngeal swab, presence of viral mutation(s) within the areas targeted by this assay, and inadequate number of viral copies(<138 copies/mL). A negative result must be combined with clinical observations, patient history, and epidemiological information. The expected result is Negative.  Fact Sheet for Patients:  EntrepreneurPulse.com.au  Fact Sheet for Healthcare Providers:  IncredibleEmployment.be  This test is no t yet approved or cleared by the [place] FDA and  has been authorized for detection and/or diagnosis of SARS-CoV-2 by FDA under an Emergency Use Authorization (EUA). This EUA will remain  in effect (meaning this test can be used) for the duration of the COVID-19 declaration  under Section 564(b)(1) of the Act, 21 U.S.C.section 360bbb-3(b)(1), unless the authorization is terminated  or revoked sooner.       Influenza A by PCR NEGATIVE NEGATIVE   Influenza B by PCR NEGATIVE NEGATIVE    Comment: (NOTE) The Xpert Xpress SARS-CoV-2/FLU/RSV plus assay is intended as an aid in the diagnosis of influenza from Nasopharyngeal swab specimens and should not be used as a sole basis for treatment. Nasal washings and aspirates are unacceptable for Xpert Xpress SARS-CoV-2/FLU/RSV testing.  Fact Sheet for Patients: EntrepreneurPulse.com.au  Fact Sheet for Healthcare Providers: IncredibleEmployment.be  This test is not yet approved or cleared by the [place] FDA and has been authorized for detection and/or diagnosis of SARS-CoV-2 by  FDA under an Emergency Use Authorization (EUA). This EUA will remain in effect (meaning this test can be used) for the duration of the COVID-19 declaration under Section 564(b)(1) of the Act, 21 U.S.C. section 360bbb-3(b)(1), unless the authorization is terminated or revoked.  Performed at [facility], [place] [address], [place], [place] [id number]   Basic metabolic panel     Status: Abnormal   Collection Time: 07/18/21  2:47 AM  Result Value Ref Range   Sodium 137 135 - 145 mmol/L   Potassium 4.1 3.5 - 5.1 mmol/L   Chloride 110 98 - 111 mmol/L   CO2 21 (L) 22 - 32 mmol/L   Glucose, Bld 291 (H) 70 - 99 mg/dL    Comment: Glucose reference range applies only to samples taken after fasting for at least 8 hours.   BUN 23 8 - 23 mg/dL   Creatinine, Ser 1.24 (H) 0.44 - 1.00 mg/dL   Calcium 9.0 8.9 - 10.3 mg/dL   GFR, Estimated 46 (L) >60 mL/min    Comment: (NOTE) Calculated using the CKD-EPI Creatinine Equation (2021)    Anion gap 6 5 - 15    Comment: Performed at [facility] [address] [place], [place] [address]  CBC with Differential     Status: Abnormal   Collection Time:  07/18/21  2:47 AM  Result Value Ref Range   WBC 8.3 4.0 - 10.5 K/uL   RBC 3.38 (L) 3.87 - 5.11 MIL/uL   Hemoglobin 10.0 (L) 12.0 - 15.0 g/dL   HCT 32.0 (L) 36.0 - 46.0 %   MCV 94.7 80.0 - 100.0 fL   MCH 29.6 26.0 - 34.0 pg   MCHC 31.3 30.0 - 36.0 g/dL   RDW 16.5 (H) 11.5 - 15.5 %   Platelets 177 150 - 400 K/uL   nRBC 0.0 0.0 - 0.2 %   Neutrophils Relative % 54 %   Neutro Abs 4.5 1.7 - 7.7 K/uL   Lymphocytes Relative 33 %   Lymphs Abs 2.8 0.7 - 4.0 K/uL   Monocytes Relative 10 %   Monocytes Absolute 0.8 0.1 - 1.0 K/uL   Eosinophils Relative 1 %   Eosinophils Absolute 0.1 0.0 - 0.5 K/uL   Basophils Relative 1 %   Basophils Absolute 0.1 0.0 - 0.1 K/uL   Immature Granulocytes 1 %   Abs Immature Granulocytes 0.05 0.00 - 0.07 K/uL    Comment: Performed at [facility] [address], [place], [place] [address]  Protime-INR     Status: Abnormal   Collection Time: 07/18/21  2:47 AM  Result Value Ref Range   Prothrombin Time 16.6 (H) 11.4 - 15.2 seconds   INR 1.3 (H) 0.8 - 1.2    Comment: (NOTE) INR goal varies based on device and disease states. Performed at [facility], [place] [address], [place], [place] [address]   Type and screen [place]     Status: None   Collection Time: 07/18/21  2:47 AM  Result Value Ref Range   ABO/RH(D) A POS    Antibody Screen NEG    Sample Expiration      07/21/2021,2359 Performed at [facility], [place] [address] [place], [place] [address]   CBG monitoring, ED     Status: Abnormal   Collection Time: 07/18/21  5:00 AM  Result Value Ref Range   Glucose-Capillary 235 (H) 70 - 99 mg/dL    Comment: Glucose reference  range applies only to samples taken after fasting for at least 8 hours.  Basic metabolic panel     Status: Abnormal   Collection Time: 07/18/21  5:03 AM  Result Value Ref Range   Sodium 141 135 - 145 mmol/L   Potassium 3.9 3.5 - 5.1 mmol/L   Chloride 108 98 - 111 mmol/L   CO2 22 22 - 32 mmol/L    Glucose, Bld 237 (H) 70 - 99 mg/dL    Comment: Glucose reference range applies only to samples taken after fasting for at least 8 hours.   BUN 22 8 - 23 mg/dL   Creatinine, Ser 1.09 (H) 0.44 - 1.00 mg/dL   Calcium 9.1 8.9 - 10.3 mg/dL   GFR, Estimated 54 (L) >60 mL/min    Comment: (NOTE) Calculated using the CKD-EPI Creatinine Equation (2021)    Anion gap 11 5 - 15    Comment: Performed at [facility] [address] [place], [place] [address]  CBC     Status: Abnormal   Collection Time: 07/18/21  5:03 AM  Result Value Ref Range   WBC 7.5 4.0 - 10.5 K/uL   RBC 3.17 (L) 3.87 - 5.11 MIL/uL   Hemoglobin 9.2 (L) 12.0 - 15.0 g/dL   HCT 30.4 (L) 36.0 - 46.0 %   MCV 95.9 80.0 - 100.0 fL   MCH 29.0 26.0 - 34.0 pg   MCHC 30.3 30.0 - 36.0 g/dL   RDW 16.4 (H) 11.5 - 15.5 %   Platelets 171 150 - 400 K/uL   nRBC 0.0 0.0 - 0.2 %    Comment: Performed at [facility], [place] [address], [place], [place] [address]  Magnesium     Status: None   Collection Time: 07/18/21  5:03 AM  Result Value Ref Range   Magnesium 2.0 1.7 - 2.4 mg/dL    Comment: Performed at [facility] [address] [place], [place] [address]  CBG monitoring, ED     Status: Abnormal   Collection Time: 07/18/21  7:54 AM  Result Value Ref Range   Glucose-Capillary 220 (H) 70 - 99 mg/dL    Comment: Glucose reference range applies only to samples taken after fasting for at least 8 hours.  CBG monitoring, ED     Status: Abnormal   Collection Time: 07/18/21 11:07 AM  Result Value Ref Range   Glucose-Capillary 245 (H) 70 - 99 mg/dL    Comment: Glucose reference range applies only to samples taken after fasting for at least 8 hours.   Comment 1 Notify RN    Comment 2 Document in Chart     No results found.  ROS Blood pressure (!) 162/119, pulse 72, temperature 98.8 F (37.1 C), resp. rate (!) 21, height 5\' 6"  (1.676 m), weight 86.2 kg, SpO2 96 %. Physical Exam HENT:     Head: Normocephalic.     Nose:  Nose normal.     Mouth/Throat:     Mouth: Mucous membranes are moist.  Neck:     Comments: The trach site had some granulation tissue around it.  I could pass a suction catheter into the trachea without difficulty.  There is a little bit of bleeding.  The patient makes a slight noise when she gets anxious with breathing. Neurological:     Mental Status: She is alert.      Assessment/Plan: [name] has a dislodged trach that now needs to be replaced.  Apparently she had  a #8 cuffed trach previously by the family members history.  This will be attempted to be replaced.  The family and patient was informed the risk and benefits of the procedure and options were discussed all questions were answered and consent was obtained.  Melissa Montane 07/18/2021, 11:57 AM

## 2021-07-18 NOTE — Consult Note (Signed)
NAME:  [name], MRN:  [id number], DOB:  03/31/1951, LOS: 0 ADMISSION DATE:  07/18/2021, CONSULTATION DATE: July 18, 2021 REFERRING MD: ED, CHIEF COMPLAINT: Displacement of the tracheostomy tube  History of Present Illness:  Patient is a 71 year old Caucasian female who is with her daughter had a trach of July of last year because of the chest OSA chronic hypercapnia she had trach exchange 3-4 times last 2 times done by her daughter by the bedside today she was trying to exchange the trach the trach would not go and tried several times during go and came into the hospital the ED try to pass a trach stoma was closed by the time also I came down to see the patient in the ED patient had no stridor hemoptysis shortness of breath or chest pain but the trach stoma was completely occluded close to the sealed.  Objective   Blood pressure (!) 160/87, pulse 72, temperature 98.3 F (36.8 C), temperature source Oral, resp. rate 18, SpO2 97 %.       No intake or output data in the 24 hours ending 07/18/21 0237 There were no vitals filed for this visit.  Examination:  Patient had no respiratory distress hemoptysis bleeding of the from the trach site or stridor trach stoma was closed. General:  NAD , pleasant and hungry , wants to eat . Neuro:  WNL , AOX3 , EOMI , CN II-XII intact , UL , LL strength is symmetrical and 5/5 HEENT:  atraumatic , no jaundice , dry mucous membranes  Cardiovascular: Regular rate and rhythm no murmur  lungs:  CTA bilateral , no wheezing or crackles  Abdomen:  Soft lax +BS , no tenderness . Musculoskeletal:  WNL , normal pulses  Skin:  No rash     Assessment & Plan:    --Chronic trach for OSA OHS chronic hypercapnia at this point patient trach Placed and it would be very risky to try to push the trach and close stoma this point patient seem to be very comfortable no need for intubation or BiPAP awake no further trials of chronic the patient titrate down the neck  stoma is already closed discussion with the family and the pulmonary team of possible revision of the trach in the near future.    Labs   CBC: No results for input(s): WBC, NEUTROABS, HGB, HCT, MCV, PLT in the last 168 hours.  Basic Metabolic Panel: No results for input(s): NA, K, CL, CO2, GLUCOSE, BUN, CREATININE, CALCIUM, MG, PHOS in the last 168 hours. GFR: CrCl cannot be calculated (Patient's most recent lab result is older than the maximum 21 days allowed.). No results for input(s): PROCALCITON, WBC, LATICACIDVEN in the last 168 hours.  Liver Function Tests: No results for input(s): AST, ALT, ALKPHOS, BILITOT, PROT, ALBUMIN in the last 168 hours. No results for input(s): LIPASE, AMYLASE in the last 168 hours. No results for input(s): AMMONIA in the last 168 hours.  ABG    Component Value Date/Time   PHART 7.554 (H) 01/03/2021 2326   PCO2ART 38.1 01/03/2021 2326   PO2ART 270 (H) 01/03/2021 2326   HCO3 33.6 (H) 01/03/2021 2326   TCO2 35 (H) 01/03/2021 2326   ACIDBASEDEF 1.0 01/26/2015 1132   O2SAT 100.0 01/03/2021 2326     Coagulation Profile: No results for input(s): INR, PROTIME in the last 168 hours.  Cardiac Enzymes: No results for input(s): CKTOTAL, CKMB, CKMBINDEX, TROPONINI in the last 168 hours.  HbA1C: Hgb A1c MFr Bld  Date/Time Value Ref Range Status  11/22/2020 04:03 AM 7.6 (H) 4.8 - 5.6 % Final    Comment:    (NOTE) Pre diabetes:          5.7%-6.4%  Diabetes:              >6.4%  Glycemic control for   <7.0% adults with diabetes   08/24/2020 10:02 AM 7.9 (H) 4.8 - 5.6 % Final    Comment:    (NOTE) Pre diabetes:          5.7%-6.4%  Diabetes:              >6.4%  Glycemic control for   <7.0% adults with diabetes     CBG: No results for input(s): GLUCAP in the last 168 hours.    Past Medical History:  She,  has a past medical history of Abnormal pulmonary function test, Anemia, Anxiety, Arthritis, Barrett's esophagus, Chest pain, Chronic  anticoagulation, Chronic combined systolic and diastolic CHF (congestive heart failure) ([place]), Chronic LBP, Diabetes mellitus, type 2 ([place]), Gastroparesis, GERD (gastroesophageal reflux disease), Heart attack ([place]) (08/13/2020), Hiatal hernia, Hyperlipidemia, Hypertension, Hypothyroid, IBS (irritable bowel syndrome), Obesity, OSA on CPAP, Paroxysmal atrial fibrillation ([place]), Pulmonary hypertension ([place]) (01/2015), Seizures ([place]), and Syncope.   Surgical History:   Past Surgical History:  Procedure Laterality Date   BACK SURGERY  1996   BIOPSY N/A 11/08/2013   Procedure: BIOPSY  / Tissue sampling / ulcers present in small intestine;  Surgeon: Danie Binder, MD;  [facility];  Service: Endoscopy;  Laterality: N/A;   CARDIAC CATHETERIZATION  2002   CARDIAC CATHETERIZATION N/A 01/26/2015   Procedure: Right Heart Cath;  Surgeon: Larey Dresser, MD;  Location: [facility];  Service: Cardiovascular;  Laterality: N/A;   CARDIOVASCULAR STRESS TEST  2008   Stress nuclear study   CARDIOVERSION N/A 03/06/2015   Procedure: CARDIOVERSION;  Surgeon: Larey Dresser, MD;  Location: [facility];  Service: Cardiovascular;  Laterality: N/A;   CARPAL TUNNEL RELEASE  1994   COLONOSCOPY  11/2011   Dr. Oneida Alar: Internal hemorrhoids, mild diverticulosis. Random colon biopsies negative.   COLONOSCOPY N/A 11/08/2013   SLF: Normal mucosa in the terminal ileum/The colon IS redundant/  Moderate diverticulosis throughout the entire colon. ileum bx benign. colon bx benign   CRYOTHERAPY  01/23/2021   Procedure: CRYOTHERAPY;  Surgeon: Candee Furbish, MD;  Location: [facility] ENDOSCOPY;  Service: Pulmonary;;   CRYOTHERAPY  01/24/2021   Procedure: Cydney Ok;  Surgeon: Candee Furbish, MD;  Location: [facility] ENDOSCOPY;  Service: Pulmonary;;   CRYOTHERAPY  01/26/2021   Procedure: Cydney Ok;  Surgeon: Juanito Doom, MD;  Location: [facility] ENDOSCOPY;  Service: Cardiopulmonary;;   ESOPHAGOGASTRODUODENOSCOPY  2008    Barrett's without dysplasia. esphagus dilated. antral erosions, h.pylori serologies negative.   ESOPHAGOGASTRODUODENOSCOPY  11/2011   Dr. Oneida Alar: Barrett's esophagus, mild gastritis, diverticulum in the second portion of the duodenum repeat EGD 3 years. Small bowel biopsies negative. Gastric biopsy show reactive gastropathy but no H. pylori. Esophageal biopsies consistent with GERD. Next EGD 11/2014   ESOPHAGOGASTRODUODENOSCOPY N/A 11/21/2014   [id number] non-erosive gastritis/irregular z-line   FOREIGN BODY REMOVAL  01/23/2021   Procedure: FOREIGN BODY REMOVAL;  Surgeon: Candee Furbish, MD;  Location: [facility];  Service: Pulmonary;;   FOREIGN BODY REMOVAL  01/24/2021   Procedure: FOREIGN BODY REMOVAL;  Surgeon: Candee Furbish, MD;  Location: [facility] ENDOSCOPY;  Service: Pulmonary;;   GIVENS CAPSULE STUDY  12/07/2011   Proximal  small bowel, rare AVM. Distal small bowel, multiple ulcers noted   GIVENS CAPSULE STUDY N/A 09/27/2013   Distal small bowel ulcers extending to TI.   GIVENS CAPSULE STUDY N/A 10/10/2013   Procedure: GIVENS CAPSULE STUDY;  Surgeon: Danie Binder, MD;  [facility];  Service: Endoscopy;  Laterality: N/A;  7:30   HEMOSTASIS CONTROL  01/24/2021   Procedure: HEMOSTASIS CONTROL;  Surgeon: Candee Furbish, MD;  Location: [facility] ENDOSCOPY;  Service: Pulmonary;;   HEMOSTASIS CONTROL  01/26/2021   Procedure: HEMOSTASIS CONTROL;  Surgeon: Juanito Doom, MD;  Location[facility];  Service: Cardiopulmonary;;   IR GASTROSTOMY TUBE MOD SED  01/11/2021   IR GASTROSTOMY TUBE REMOVAL  04/13/2021   IR KYPHO THORACIC WITH BONE BIOPSY  02/09/2018   KNEE ARTHROSCOPY WITH MEDIAL MENISECTOMY Right 06/09/2016   Procedure: KNEE ARTHROSCOPY WITH MEDIAL MENISECTOMY;  Surgeon: Carole Civil, MD;  Location: [facility];  Service: Orthopedics;  Laterality: Right;  medial and lateral menisectomy   LAMINECTOMY  1995   L4-L5   LAPAROSCOPIC CHOLECYSTECTOMY  1990s   LEFT HEART CATHETERIZATION WITH  CORONARY ANGIOGRAM  01/10/2014   Procedure: LEFT HEART CATHETERIZATION WITH CORONARY ANGIOGRAM;  Surgeon: Sinclair Grooms, MD;  Location: [facility] CATH LAB;  Service: Cardiovascular;;   RIGHT HEART CATHETERIZATION N/A 01/10/2014   Procedure: RIGHT HEART CATH;  Surgeon: Sinclair Grooms, MD;  Location: [facility] CATH LAB;  Service: Cardiovascular;  Laterality: N/A;   TOTAL ABDOMINAL HYSTERECTOMY  1999   TRACHEOSTOMY TUBE PLACEMENT N/A 09/01/2017   Procedure: TRACHEOSTOMY;  Surgeon: Rozetta Nunnery, MD;  Location: [facility];  Service: ENT;  Laterality: N/A;   VIDEO BRONCHOSCOPY Bilateral 01/23/2021   Procedure: VIDEO BRONCHOSCOPY WITHOUT FLUORO;  Surgeon: Candee Furbish, MD;  Location: [facility] ENDOSCOPY;  Service: Pulmonary;  Laterality: Bilateral;   VIDEO BRONCHOSCOPY Bilateral 01/24/2021   Procedure: VIDEO BRONCHOSCOPY WITHOUT FLUORO;  Surgeon: Candee Furbish, MD;  Location: [facility] ENDOSCOPY;  Service: Pulmonary;  Laterality: Bilateral;   VIDEO BRONCHOSCOPY N/A 01/26/2021   Procedure: VIDEO BRONCHOSCOPY WITHOUT FLUORO;  Surgeon: Juanito Doom, MD;  Location: [facility];  Service: Cardiopulmonary;  Laterality: N/A;     Social History:   reports that she quit smoking about 38 years ago. Her smoking use included cigarettes. She started smoking about 55 years ago. She has a 3.75 pack-year smoking history. She has never used smokeless tobacco. She reports that she does not drink alcohol and does not use drugs.   Family History:  Her family history includes Alzheimer's disease in her mother; Breast cancer in her sister; Heart attack in her mother; Hypertension in her mother and another family member; Stroke in her mother. There is no history of Heart disease or Colon cancer.   Allergies Allergies  Allergen Reactions   Citalopram Hydrobromide Other (See Comments)    Dyskinesia Other reaction(s): Other (See Comments) Dyskinesia   Codeine Nausea And Vomiting and Other (See Comments)    HALLUCINATIONS Other  reaction(s): Unknown Other reaction(s): Other (See Comments) HALLUCINATIONS    Hydromorphone Hcl Other (See Comments)    Made her pass out Other reaction(s): Other (See Comments) Made her pass out   Metoclopramide Other (See Comments)    DYSKINESIA Other reaction(s): Other (See Comments) DYSKINESIA   Hyoscyamine Other (See Comments)    MADE DIARRHEA WORSE     Home Medications  Prior to Admission medications   Medication Sig Start Date End Date Taking? Authorizing Provider  acetaminophen (TYLENOL) 500 MG  tablet Place 1 tablet (500 mg total) into feeding tube as needed for moderate pain or headache. 02/15/21   Samella Parr, NP  amiodarone (PACERONE) 200 MG tablet Place 1 tablet (200 mg total) into feeding tube daily. 02/15/21   Samella Parr, NP  apixaban (ELIQUIS) 5 MG TABS tablet Place 1 tablet (5 mg total) into feeding tube 2 (two) times daily. 02/15/21   Samella Parr, NP  atorvastatin (LIPITOR) 80 MG tablet Place 1 tablet (80 mg total) into feeding tube daily. 02/15/21   Samella Parr, NP  dicyclomine (BENTYL) 20 MG tablet TAKE 1 TABLET BY MOUTH UP TO THREE TIMES DAILY BEFORE MEALS AS NEEDED. 05/03/21   Erenest Rasher, PA-C  donepezil (ARICEPT ODT) 5 MG disintegrating tablet Place 1 tablet (5 mg total) into feeding tube at bedtime. 02/15/21   Samella Parr, NP  DULoxetine (CYMBALTA) 30 MG capsule Take 1 capsule (30 mg total) by mouth 2 (two) times daily. 02/15/21   Samella Parr, NP  feeding supplement (ENSURE ENLIVE / ENSURE PLUS) LIQD Take 237 mLs by mouth 2 (two) times daily between meals. 02/15/21   Samella Parr, NP  fiber (NUTRISOURCE FIBER) PACK packet Place 1 packet into feeding tube 2 (two) times daily. 02/15/21   Samella Parr, NP  furosemide (LASIX) 40 MG tablet Place 1 tablet (40 mg total) into feeding tube daily. 02/15/21   Samella Parr, NP  gabapentin (NEURONTIN) 100 MG capsule Take 1 capsule (100 mg total) by mouth 3 (three) times daily. 09/21/20    Samella Parr, NP  insulin glargine (LANTUS) 100 UNIT/ML injection Inject 0.2 mLs (20 Units total) into the skin daily. 12/30/20   Orson Eva, MD  ipratropium-albuterol (DUONEB) 0.5-2.5 (3) MG/3ML SOLN Take 3 mLs by nebulization every 4 (four) hours as needed. 02/15/21   Samella Parr, NP  levETIRAcetam (KEPPRA) 100 MG/ML solution Place 10 mLs (1,000 mg total) into feeding tube 2 (two) times daily. 02/15/21   Samella Parr, NP  levothyroxine (SYNTHROID) 175 MCG tablet Place 1 tablet (175 mcg total) into feeding tube daily at 6 (six) AM. 02/16/21   Samella Parr, NP  loperamide HCl (IMODIUM) 1 MG/7.5ML suspension Place 15 mLs (2 mg total) into feeding tube 2 (two) times daily as needed for diarrhea or loose stools. 03/15/21   Mahala Menghini, PA-C  meclizine (ANTIVERT) 12.5 MG tablet Place 1 tablet (12.5 mg total) into feeding tube 3 (three) times daily as needed for dizziness. 02/15/21   Samella Parr, NP  melatonin 3 MG TABS tablet Place 1 tablet (3 mg total) into feeding tube at bedtime as needed. 02/15/21   Samella Parr, NP  metoprolol tartrate (LOPRESSOR) 25 MG tablet Place 1 tablet (25 mg total) into feeding tube 2 (two) times daily. 02/15/21   Samella Parr, NP  Nutritional Supplements (FEEDING SUPPLEMENT, PROSOURCE TF,) liquid Place 45 mLs into feeding tube 3 (three) times daily. 02/15/21   Samella Parr, NP  ondansetron (ZOFRAN) 4 MG tablet Place 1 tablet (4 mg total) into feeding tube 4 (four) times daily as needed for nausea. 02/15/21   Samella Parr, NP  pantoprazole (PROTONIX) 40 MG tablet Take 1 tablet (40 mg total) by mouth 2 (two) times daily before a meal. 02/15/21   Samella Parr, NP  potassium chloride (KLOR-CON) 20 MEQ packet Place 20 mEq into feeding tube daily. 02/15/21   Samella Parr, NP  predniSONE (DELTASONE) 5  MG tablet Take 5 mg by mouth daily. 05/05/21   [provider]  sertraline (ZOLOFT) 25 MG tablet Place 1 tablet (25 mg total) into feeding  tube daily. 02/15/21   Samella Parr, NP  SM MELATONIN 3 MG TABS tablet Place 1 tablet (3 mg total) into feeding tube at bedtime as needed. 02/15/21   Samella Parr, NP  Water For Irrigation, Sterile (FREE WATER) SOLN Place 100 mLs into feeding tube every 6 (six) hours. 02/15/21   Samella Parr, NP

## 2021-07-18 NOTE — Anesthesia Procedure Notes (Signed)
Procedure Name: Intubation Date/Time: 07/18/2021 12:15 PM Performed by: Alain Marion, CRNA Pre-anesthesia Checklist: Patient identified, Emergency Drugs available, Suction available and Patient being monitored Patient Re-evaluated:Patient Re-evaluated prior to induction Oxygen Delivery Method: Circle System Utilized Preoxygenation: Pre-oxygenation with 100% oxygen Induction Type: IV induction and Rapid sequence Laryngoscope Size: Glidescope and 3 Grade View: Grade I Tube type: Oral Tube size: 6.5 mm Number of attempts: 1 Airway Equipment and Method: Stylet and Video-laryngoscopy Placement Confirmation: ETT inserted through vocal cords under direct vision, positive ETCO2 and breath sounds checked- equal and bilateral Secured at: 21 cm Tube secured with: Tape Dental Injury: Teeth and Oropharynx as per pre-operative assessment  Comments: Elective glide use due to surgical procedure and present tracheostomy

## 2021-07-18 NOTE — Anesthesia Postprocedure Evaluation (Signed)
Anesthesia Post Note  Patient: [name]  Procedure(s) Performed: TRACHEOSTOMY REVISION     Patient location during evaluation: PACU Anesthesia Type: General Level of consciousness: sedated and patient cooperative Pain management: pain level controlled Vital Signs Assessment: post-procedure vital signs reviewed and stable Respiratory status: spontaneous breathing Cardiovascular status: stable Anesthetic complications: no   No notable events documented.  Last Vitals:  Vitals:   07/18/21 1635 07/18/21 1652  BP: (!) 152/68   Pulse: 84   Resp: 18   Temp: 36.8 C   SpO2: 92% 96%    Last Pain:  Vitals:   07/18/21 1635  TempSrc: Oral  PainSc: 0-No pain                 Lewie Loron

## 2021-07-18 NOTE — ED Notes (Signed)
Daughter present at bedside, has supplies with her. States pt uses size 8 shiley. RT called for assistance with trach care. Pt remains without distress

## 2021-07-18 NOTE — ED Triage Notes (Addendum)
Pt in from home via [facility] with loss of trach cannula.  She states she and her daughter were performing nightly trach care, and the cannula fell out, some bleeding noted at that time. No current distress - pt is vent dependent at night. She is unsure what size trach she uses, but daughter is arriving shortly with supplies. Sats 99% on 2LNC. Pt a&ox4, stoma is open and patent, some pink mucous present

## 2021-07-18 NOTE — ED Notes (Signed)
Patient requested daughter sign consent for tracheostomy due to anxiety from dyspnea. Consent signed by daughter and placed at bedside.

## 2021-07-19 ENCOUNTER — Encounter (HOSPITAL_COMMUNITY): Payer: Self-pay | Admitting: Otolaryngology

## 2021-07-19 ENCOUNTER — Encounter (HOSPITAL_COMMUNITY): Payer: Self-pay | Admitting: Internal Medicine

## 2021-07-19 DIAGNOSIS — J9612 Chronic respiratory failure with hypercapnia: Secondary | ICD-10-CM

## 2021-07-19 DIAGNOSIS — J9611 Chronic respiratory failure with hypoxia: Secondary | ICD-10-CM

## 2021-07-19 LAB — BASIC METABOLIC PANEL
Anion gap: 8 (ref 5–15)
BUN: 23 mg/dL (ref 8–23)
CO2: 21 mmol/L — ABNORMAL LOW (ref 22–32)
Calcium: 9.1 mg/dL (ref 8.9–10.3)
Chloride: 113 mmol/L — ABNORMAL HIGH (ref 98–111)
Creatinine, Ser: 1.06 mg/dL — ABNORMAL HIGH (ref 0.44–1.00)
GFR, Estimated: 56 mL/min — ABNORMAL LOW (ref >60)
Glucose, Bld: 88 mg/dL (ref 70–99)
Potassium: 3.9 mmol/L (ref 3.5–5.1)
Sodium: 142 mmol/L (ref 135–145)

## 2021-07-19 LAB — CBC
HCT: 29.3 % — ABNORMAL LOW (ref 36.0–46.0)
Hemoglobin: 9 g/dL — ABNORMAL LOW (ref 12.0–15.0)
MCH: 28.6 pg (ref 26.0–34.0)
MCHC: 30.7 g/dL (ref 30.0–36.0)
MCV: 93 fL (ref 80.0–100.0)
Platelets: 148 10*3/uL — ABNORMAL LOW (ref 150–400)
RBC: 3.15 MIL/uL — ABNORMAL LOW (ref 3.87–5.11)
RDW: 16.5 % — ABNORMAL HIGH (ref 11.5–15.5)
WBC: 8.6 10*3/uL (ref 4.0–10.5)
nRBC: 0 % (ref 0.0–0.2)

## 2021-07-19 LAB — GLUCOSE, CAPILLARY
Glucose-Capillary: 94 mg/dL (ref 70–99)
Glucose-Capillary: 98 mg/dL (ref 70–99)

## 2021-07-19 MED ORDER — FUROSEMIDE 40 MG PO TABS: 40.0000 mg | ORAL_TABLET | Freq: Every day | ORAL | Status: DC

## 2021-07-19 MED ORDER — SERTRALINE HCL 25 MG PO TABS: 25.0000 mg | ORAL_TABLET | Freq: Every day | ORAL | Status: DC

## 2021-07-19 MED ORDER — METOPROLOL TARTRATE 25 MG PO TABS: 25.0000 mg | ORAL_TABLET | Freq: Two times a day (BID) | ORAL | Status: DC

## 2021-07-19 MED ORDER — MELATONIN 3 MG PO TABS: 3.0000 mg | ORAL_TABLET | Freq: Every evening | ORAL | 0 refills | Status: DC | PRN

## 2021-07-19 MED ORDER — INSULIN GLARGINE 100 UNIT/ML ~~LOC~~ SOLN: 15.0000 [IU] | Freq: Every day | SUBCUTANEOUS | 11 refills | Status: DC

## 2021-07-19 MED ORDER — LEVOTHYROXINE SODIUM 175 MCG PO TABS: 175.0000 ug | ORAL_TABLET | Freq: Every day | ORAL | Status: DC

## 2021-07-19 MED ORDER — DONEPEZIL HCL 5 MG PO TABS: 5.0000 mg | ORAL_TABLET | Freq: Every day | ORAL | Status: DC

## 2021-07-19 MED ORDER — DULOXETINE HCL 30 MG PO CPEP: 30.0000 mg | ORAL_CAPSULE | Freq: Every day | ORAL | 3 refills | Status: DC

## 2021-07-19 MED ORDER — APIXABAN 5 MG PO TABS: 5.0000 mg | ORAL_TABLET | Freq: Two times a day (BID) | ORAL | Status: DC

## 2021-07-19 MED ORDER — AMIODARONE HCL 200 MG PO TABS: 200.0000 mg | ORAL_TABLET | Freq: Every day | ORAL | Status: DC

## 2021-07-19 MED ORDER — ATORVASTATIN CALCIUM 80 MG PO TABS: 80.0000 mg | ORAL_TABLET | Freq: Every day | ORAL | Status: DC

## 2021-07-19 NOTE — Progress Notes (Signed)
Discharge instructions reviewed with patient and daughter at the bedside. Patient placed on trach collar and transported to vehicle. All belongings returned to daughter [name].

## 2021-07-19 NOTE — Progress Notes (Addendum)
NAME:  [name], MRN:  [id number], DOB:  14-Oct-1950, LOS: 0 ADMISSION DATE:  07/18/2021, CONSULTATION DATE: July 18, 2021 REFERRING MD: ED, CHIEF COMPLAINT: Displacement of the tracheostomy tube  History of Present Illness:  Patient is a 71 year old Caucasian female who is with her daughter had a trach of July of last year because of the chest OSA chronic hypercapnia she had trach exchange 3-4 times last 2 times done by her daughter by the bedside today she was trying to exchange the trach the trach would not go and tried several times during go and came into the hospital the ED try to pass a trach stoma was closed by the time also I came down to see the patient in the ED patient had no stridor hemoptysis shortness of breath or chest pain but the trach stoma was completely occluded close to the sealed.  Interval history:  Underwent Re-do trach 1/15 by Dr. Jearld Fenton on 1/15. Course uncomplicated.   Objective   Blood pressure (!) 144/76, pulse 64, temperature 98.7 F (37.1 C), temperature source Oral, resp. rate 18, height 5\' 6"  (1.676 m), weight 85.4 kg, SpO2 100 %.    Vent Mode: PRVC FiO2 (%):  [28 %-55 %] 28 % Set Rate:  [22 bmp] 22 bmp Vt Set:  [510 mL] 510 mL PEEP:  [5 cmH20] 5 cmH20 Plateau Pressure:  [18 cmH20-20 cmH20] 20 cmH20   Intake/Output Summary (Last 24 hours) at 07/19/2021 0948 Last data filed at 07/19/2021 0000 Gross per 24 hour  Intake 710 ml  Output --  Net 710 ml   Filed Weights   07/18/21 1147 07/19/21 0500  Weight: 86.2 kg 85.4 kg    Examination: General:  Frail elderly female. Pleasant.  Neuro:  Alert, oriented, non-focal.  HEENT:  [place]/AT, PERRL, no JVD Cardiovascular: RRR, no mrg lungs:  Clear bilateral breath sounds  Abdomen:  Soft, non-tender, non-distended. Hyperactive.  Musculoskeletal:  No acute deformity or ROM limitation.  Skin:  Grossly intact.    Assessment & Plan:   Chronic hypercarbic respiratory failure secondary to OSA/OHS.  Trach  dependent. Trach collar during the day, PRVC at night. New #6 cuffed Shiley was placed 1/15 by ENT.  She previously had a size 8 cuffed.  - Continue trach collar during waking hours and vent with sleep.  - No changes in home care regimen - Will need to send her home with at least one spare trach and inner cannulas - Ensure speaking valve at home will fit new trach. - She has an appointment with the trach clinic on 1/23 at which point she can have her trach sutures removed.   Ok for discharge from pulm standpoint    Labs   CBC: Recent Labs  Lab 07/18/21 0247 07/18/21 0503 07/19/21 0417  WBC 8.3 7.5 8.6  NEUTROABS 4.5  --   --   HGB 10.0* 9.2* 9.0*  HCT 32.0* 30.4* 29.3*  MCV 94.7 95.9 93.0  PLT 177 171 148*    Basic Metabolic Panel: Recent Labs  Lab 07/18/21 0247 07/18/21 0503 07/19/21 0417  NA 137 141 142  K 4.1 3.9 3.9  CL 110 108 113*  CO2 21* 22 21*  GLUCOSE 291* 237* 88  BUN 23 22 23   CREATININE 1.24* 1.09* 1.06*  CALCIUM 9.0 9.1 9.1  MG  --  2.0  --    GFR: Estimated Creatinine Clearance: 52.8 mL/min (A) (by C-G formula based on SCr of 1.06 mg/dL (H)). Recent Labs  Lab  07/18/21 0247 07/18/21 0503 07/19/21 0417  WBC 8.3 7.5 8.6    Liver Function Tests: No results for input(s): AST, ALT, ALKPHOS, BILITOT, PROT, ALBUMIN in the last 168 hours. No results for input(s): LIPASE, AMYLASE in the last 168 hours. No results for input(s): AMMONIA in the last 168 hours.  ABG    Component Value Date/Time   PHART 7.554 (H) 01/03/2021 2326   PCO2ART 38.1 01/03/2021 2326   PO2ART 270 (H) 01/03/2021 2326   HCO3 33.6 (H) 01/03/2021 2326   TCO2 35 (H) 01/03/2021 2326   ACIDBASEDEF 1.0 01/26/2015 1132   O2SAT 100.0 01/03/2021 2326     Coagulation Profile: Recent Labs  Lab 07/18/21 0247  INR 1.3*    Cardiac Enzymes: No results for input(s): CKTOTAL, CKMB, CKMBINDEX, TROPONINI in the last 168 hours.  HbA1C: Hgb A1c MFr Bld  Date/Time Value Ref Range  Status  11/22/2020 04:03 AM 7.6 (H) 4.8 - 5.6 % Final    Comment:    (NOTE) Pre diabetes:          5.7%-6.4%  Diabetes:              >6.4%  Glycemic control for   <7.0% adults with diabetes   08/24/2020 10:02 AM 7.9 (H) 4.8 - 5.6 % Final    Comment:    (NOTE) Pre diabetes:          5.7%-6.4%  Diabetes:              >6.4%  Glycemic control for   <7.0% adults with diabetes     CBG: Recent Labs  Lab 07/18/21 1636 07/18/21 1910 07/18/21 2311 07/19/21 0306 07/19/21 0808  GLUCAP 202* 186* 156* 94 98      Past Medical History:  She,  has a past medical history of Abnormal pulmonary function test, Anemia, Anxiety, Arthritis, Barrett's esophagus, Chest pain, Chronic anticoagulation, Chronic combined systolic and diastolic CHF (congestive heart failure) (HCC), Chronic LBP, Diabetes mellitus, type 2 ([place]), Gastroparesis, GERD (gastroesophageal reflux disease), Heart attack ([place]) (08/13/2020), Hiatal hernia, Hyperlipidemia, Hypertension, Hypothyroid, IBS (irritable bowel syndrome), Obesity, OSA on CPAP, Paroxysmal atrial fibrillation ([place]), Pulmonary hypertension ([place]) (01/2015), Seizures ([place]), and Syncope.   Surgical History:   Past Surgical History:  Procedure Laterality Date   BACK SURGERY  1996   BIOPSY N/A 11/08/2013   Procedure: BIOPSY  / Tissue sampling / ulcers present in small intestine;  Surgeon: Danie Binder, MD;  [facility];  Service: Endoscopy;  Laterality: N/A;   CARDIAC CATHETERIZATION  2002   CARDIAC CATHETERIZATION N/A 01/26/2015   Procedure: Right Heart Cath;  Surgeon: Larey Dresser, MD;  Location: [place] [facility];  Service: Cardiovascular;  Laterality: N/A;   CARDIOVASCULAR STRESS TEST  2008   Stress nuclear study   CARDIOVERSION N/A 03/06/2015   Procedure: CARDIOVERSION;  Surgeon: Larey Dresser, MD;  Location: [place];  Service: Cardiovascular;  Laterality: N/A;   CARPAL TUNNEL RELEASE  1994   COLONOSCOPY  11/2011   Dr. Oneida Alar:  Internal hemorrhoids, mild diverticulosis. Random colon biopsies negative.   COLONOSCOPY N/A 11/08/2013   SLF: Normal mucosa in the terminal ileum/The colon IS redundant/  Moderate diverticulosis throughout the entire colon. ileum bx benign. colon bx benign   CRYOTHERAPY  01/23/2021   Procedure: CRYOTHERAPY;  Surgeon: Candee Furbish, MD;  Location[facility] ENDOSCOPY;  Service: Pulmonary;;   CRYOTHERAPY  01/24/2021   Procedure: Cydney Ok;  Surgeon: Candee Furbish, MD;  Location: [facility] ENDOSCOPY;  Service: Pulmonary;;   CRYOTHERAPY  01/26/2021   Procedure: CRYOTHERAPY;  Surgeon: Juanito Doom, MD;  Location: [facility] ENDOSCOPY;  Service: Cardiopulmonary;;   ESOPHAGOGASTRODUODENOSCOPY  2008   Barrett's without dysplasia. esphagus dilated. antral erosions, h.pylori serologies negative.   ESOPHAGOGASTRODUODENOSCOPY  11/2011   Dr. Oneida Alar: Barrett's esophagus, mild gastritis, diverticulum in the second portion of the duodenum repeat EGD 3 years. Small bowel biopsies negative. Gastric biopsy show reactive gastropathy but no H. pylori. Esophageal biopsies consistent with GERD. Next EGD 11/2014   ESOPHAGOGASTRODUODENOSCOPY N/A 11/21/2014   [id number] non-erosive gastritis/irregular z-line   FOREIGN BODY REMOVAL  01/23/2021   Procedure: FOREIGN BODY REMOVAL;  Surgeon: Candee Furbish, MD;  Location: [place];  Service: Pulmonary;;   FOREIGN BODY REMOVAL  01/24/2021   Procedure: FOREIGN BODY REMOVAL;  Surgeon: Candee Furbish, MD;  Location: [facility] ENDOSCOPY;  Service: Pulmonary;;   GIVENS CAPSULE STUDY  12/07/2011   Proximal small bowel, rare AVM. Distal small bowel, multiple ulcers noted   GIVENS CAPSULE STUDY N/A 09/27/2013   Distal small bowel ulcers extending to TI.   GIVENS CAPSULE STUDY N/A 10/10/2013   Procedure: GIVENS CAPSULE STUDY;  Surgeon: Danie Binder, MD;  [facility];  Service: Endoscopy;  Laterality: N/A;  7:30   HEMOSTASIS CONTROL  01/24/2021   Procedure: HEMOSTASIS CONTROL;  Surgeon: Candee Furbish, MD;  Location: [facility] ENDOSCOPY;  Service: Pulmonary;;   HEMOSTASIS CONTROL  01/26/2021   Procedure: HEMOSTASIS CONTROL;  Surgeon: Juanito Doom, MD;  Location: [facility] ENDOSCOPY;  Service: Cardiopulmonary;;   IR GASTROSTOMY TUBE MOD SED  01/11/2021   IR GASTROSTOMY TUBE REMOVAL  04/13/2021   IR KYPHO THORACIC WITH BONE BIOPSY  02/09/2018   KNEE ARTHROSCOPY WITH MEDIAL MENISECTOMY Right 06/09/2016   Procedure: KNEE ARTHROSCOPY WITH MEDIAL MENISECTOMY;  Surgeon: Carole Civil, MD;  Location: [facility];  Service: Orthopedics;  Laterality: Right;  medial and lateral menisectomy   LAMINECTOMY  1995   L4-L5   LAPAROSCOPIC CHOLECYSTECTOMY  1990s   LEFT HEART CATHETERIZATION WITH CORONARY ANGIOGRAM  01/10/2014   Procedure: LEFT HEART CATHETERIZATION WITH CORONARY ANGIOGRAM;  Surgeon: Sinclair Grooms, MD;  Location: [facility] CATH LAB;  Service: Cardiovascular;;   RIGHT HEART CATHETERIZATION N/A 01/10/2014   Procedure: RIGHT HEART CATH;  Surgeon: Sinclair Grooms, MD;  Location: [facility] CATH LAB;  Service: Cardiovascular;  Laterality: N/A;   TOTAL ABDOMINAL HYSTERECTOMY  1999   TRACHEOSTOMY REVISION N/A 07/18/2021   Procedure: TRACHEOSTOMY REVISION;  Surgeon: Melissa Montane, MD;  Location: [place];  Service: ENT;  Laterality: N/A;   TRACHEOSTOMY TUBE PLACEMENT N/A 09/01/2017   Procedure: TRACHEOSTOMY;  Surgeon: Rozetta Nunnery, MD;  Location: [place];  Service: ENT;  Laterality: N/A;   VIDEO BRONCHOSCOPY Bilateral 01/23/2021   Procedure: VIDEO BRONCHOSCOPY WITHOUT FLUORO;  Surgeon: Candee Furbish, MD;  Location: [facility] ENDOSCOPY;  Service: Pulmonary;  Laterality: Bilateral;   VIDEO BRONCHOSCOPY Bilateral 01/24/2021   Procedure: VIDEO BRONCHOSCOPY WITHOUT FLUORO;  Surgeon: Candee Furbish, MD;  Location: [facility] ENDOSCOPY;  Service: Pulmonary;  Laterality: Bilateral;   VIDEO BRONCHOSCOPY N/A 01/26/2021   Procedure: VIDEO BRONCHOSCOPY WITHOUT FLUORO;  Surgeon: Juanito Doom, MD;  Location: [place];  Service:  Cardiopulmonary;  Laterality: N/A;     Social History:   reports that she quit smoking about 38 years ago. Her smoking use included cigarettes. She started smoking about 55 years ago. She has a 3.75 pack-year smoking history. She has never used smokeless  tobacco. She reports that she does not drink alcohol and does not use drugs.   Family History:  Her family history includes Alzheimer's disease in her mother; Breast cancer in her sister; Heart attack in her mother; Hypertension in her mother and another family member; Stroke in her mother. There is no history of Heart disease or Colon cancer.   Allergies Allergies  Allergen Reactions   Citalopram Hydrobromide Other (See Comments)    Dyskinesia Other reaction(s): Other (See Comments) Dyskinesia   Codeine Nausea And Vomiting and Other (See Comments)    HALLUCINATIONS Other reaction(s): Unknown Other reaction(s): Other (See Comments) HALLUCINATIONS    Hydromorphone Hcl Other (See Comments)    Made her pass out Other reaction(s): Other (See Comments) Made her pass out   Metoclopramide Other (See Comments)    DYSKINESIA Other reaction(s): Other (See Comments) DYSKINESIA   Hyoscyamine Other (See Comments)    MADE DIARRHEA WORSE     Home Medications  Prior to Admission medications   Medication Sig Start Date End Date Taking? Authorizing Provider  acetaminophen (TYLENOL) 500 MG tablet Place 1 tablet (500 mg total) into feeding tube as needed for moderate pain or headache. 02/15/21   Samella Parr, NP  amiodarone (PACERONE) 200 MG tablet Place 1 tablet (200 mg total) into feeding tube daily. 02/15/21   Samella Parr, NP  apixaban (ELIQUIS) 5 MG TABS tablet Place 1 tablet (5 mg total) into feeding tube 2 (two) times daily. 02/15/21   Samella Parr, NP  atorvastatin (LIPITOR) 80 MG tablet Place 1 tablet (80 mg total) into feeding tube daily. 02/15/21   Samella Parr, NP  dicyclomine (BENTYL) 20 MG tablet TAKE 1 TABLET BY MOUTH  UP TO THREE TIMES DAILY BEFORE MEALS AS NEEDED. 05/03/21   Erenest Rasher, PA-C  donepezil (ARICEPT ODT) 5 MG disintegrating tablet Place 1 tablet (5 mg total) into feeding tube at bedtime. 02/15/21   Samella Parr, NP  DULoxetine (CYMBALTA) 30 MG capsule Take 1 capsule (30 mg total) by mouth 2 (two) times daily. 02/15/21   Samella Parr, NP  feeding supplement (ENSURE ENLIVE / ENSURE PLUS) LIQD Take 237 mLs by mouth 2 (two) times daily between meals. 02/15/21   Samella Parr, NP  fiber (NUTRISOURCE FIBER) PACK packet Place 1 packet into feeding tube 2 (two) times daily. 02/15/21   Samella Parr, NP  furosemide (LASIX) 40 MG tablet Place 1 tablet (40 mg total) into feeding tube daily. 02/15/21   Samella Parr, NP  gabapentin (NEURONTIN) 100 MG capsule Take 1 capsule (100 mg total) by mouth 3 (three) times daily. 09/21/20   Samella Parr, NP  insulin glargine (LANTUS) 100 UNIT/ML injection Inject 0.2 mLs (20 Units total) into the skin daily. 12/30/20   Orson Eva, MD  ipratropium-albuterol (DUONEB) 0.5-2.5 (3) MG/3ML SOLN Take 3 mLs by nebulization every 4 (four) hours as needed. 02/15/21   Samella Parr, NP  levETIRAcetam (KEPPRA) 100 MG/ML solution Place 10 mLs (1,000 mg total) into feeding tube 2 (two) times daily. 02/15/21   Samella Parr, NP  levothyroxine (SYNTHROID) 175 MCG tablet Place 1 tablet (175 mcg total) into feeding tube daily at 6 (six) AM. 02/16/21   Samella Parr, NP  loperamide HCl (IMODIUM) 1 MG/7.5ML suspension Place 15 mLs (2 mg total) into feeding tube 2 (two) times daily as needed for diarrhea or loose stools. 03/15/21   Mahala Menghini, PA-C  meclizine (ANTIVERT) 12.5 MG  tablet Place 1 tablet (12.5 mg total) into feeding tube 3 (three) times daily as needed for dizziness. 02/15/21   Samella Parr, NP  melatonin 3 MG TABS tablet Place 1 tablet (3 mg total) into feeding tube at bedtime as needed. 02/15/21   Samella Parr, NP  metoprolol tartrate (LOPRESSOR)  25 MG tablet Place 1 tablet (25 mg total) into feeding tube 2 (two) times daily. 02/15/21   Samella Parr, NP  Nutritional Supplements (FEEDING SUPPLEMENT, PROSOURCE TF,) liquid Place 45 mLs into feeding tube 3 (three) times daily. 02/15/21   Samella Parr, NP  ondansetron (ZOFRAN) 4 MG tablet Place 1 tablet (4 mg total) into feeding tube 4 (four) times daily as needed for nausea. 02/15/21   Samella Parr, NP  pantoprazole (PROTONIX) 40 MG tablet Take 1 tablet (40 mg total) by mouth 2 (two) times daily before a meal. 02/15/21   Samella Parr, NP  potassium chloride (KLOR-CON) 20 MEQ packet Place 20 mEq into feeding tube daily. 02/15/21   Samella Parr, NP  predniSONE (DELTASONE) 5 MG tablet Take 5 mg by mouth daily. 05/05/21   [provider]  sertraline (ZOLOFT) 25 MG tablet Place 1 tablet (25 mg total) into feeding tube daily. 02/15/21   Samella Parr, NP  SM MELATONIN 3 MG TABS tablet Place 1 tablet (3 mg total) into feeding tube at bedtime as needed. 02/15/21   Samella Parr, NP  Water For Irrigation, Sterile (FREE WATER) SOLN Place 100 mLs into feeding tube every 6 (six) hours. 02/15/21   Samella Parr, NP      Georgann Housekeeper, AGACNP-BC [facility]  See Amion for personal pager PCCM on call pager [phone] until 7pm. Please call Elink 7p-7a. [phone]  07/19/2021 10:00 AM

## 2021-07-19 NOTE — Progress Notes (Signed)
Pt requesting to rest on vent a little while longer this am. Will place pt on aerosol trach collar when ready. RT will continue to monitor and be available as needed.

## 2021-07-19 NOTE — Progress Notes (Signed)
Pt cuff deflated at this time and pt placed on aerosol trach collar. Pt currently on 5L 28% FiO2 and tolerating well. RT will continue to monitor and be available.

## 2021-07-19 NOTE — TOC Transition Note (Signed)
Transition of Care [facility]) - CM/SW Discharge Note   Patient Details  Name: [name] MRN: [id number] Date of Birth: 1951-02-06  Transition of [facility]) CM/SW Contact:  Tom-Johnson, Renea Ee, RN Phone Number: 07/19/2021, 12:16 PM   Clinical Narrative:    Patient is scheduled for discharge today. Patient is from home with husband and daughter. Patient is on chronic trach/vent at home. Trach and Oxygen supplies from [facility]. [place] notified of patient's discharge. Patient is active with Home Health services from [place]. [place] notified of discharge. Denies any needs at this time. Daughter to transport at discharge. No further TOC needs noted.    Final next level of care: [place] Barriers to Discharge: Barriers Resolved   Patient Goals and CMS Choice Patient states their goals for this hospitalization and ongoing recovery are:: To return home. CMS Medicare.gov Compare Post Acute Care list provided to:: Patient Choice offered to / list presented to : NA  Discharge Placement                       Discharge Plan and Services                DME Arranged: N/A DME Agency: NA       HH Arranged: NA HH Agency: NA        Social Determinants of Health (SDOH) Interventions     Readmission Risk Interventions Readmission Risk Prevention Plan 12/28/2020 11/24/2020 09/21/2020  Transportation Screening Complete Complete Complete  HRI or Home Care Consult - Complete -  Social Work Consult for [place] Planning/Counseling - Complete -  Palliative Care Screening - Not Applicable -  Medication Review Press photographer) Complete Complete Complete  PCP or Specialist appointment within 3-5 days of discharge - - Complete  HRI or Home Care Consult Complete - Complete  SW Recovery Care/Counseling Consult Complete - Complete  Palliative Care Screening (No Data) - Complete  [place] Patient Refused - Complete  Some recent data might be  hidden

## 2021-07-19 NOTE — Discharge Summary (Signed)
Physician Discharge Summary  [name] [id number] DOB: [birth year] DOA: 07/18/2021  PCP: Lemmie Evens, MD  Admit date: 07/18/2021 Discharge date: 07/19/2021  Admitted From: Home Discharge disposition: Home   Code Status: Full Code   Discharge Diagnosis:   Principal Problem:   Tracheostomy complication ([place]) Active Problems:   Hypothyroidism   Atrial fibrillation ([place])   OSA (obstructive sleep apnea)   Seizure ([place])   Dementia ([place])   Uncontrolled type 2 diabetes mellitus with hyperglycemia (HCC)   Chronic respiratory failure with hypoxia and hypercapnia (HCC)   Depression   Dependence on home ventilator ([place])   Chronic systolic CHF (congestive heart failure) ([place])   Mild renal insufficiency    Chief Complaint  Patient presents with   [place] Idol Issues   Brief narrative: [name] is a 71 y.o. female with PMH significant for DM2, HTN, chronic systolic CHF, A. fib on Eliquis, OSA/OHS, seizures, dementia, anxiety/depression, chronic hypoxic respiratory failure on home oxygen, BiPAP intolerance, vent dependent at night Patient was brought to the ED late last night after which tracheostomy tube could not be reinserted on routine exchange.  Patient's daughter was performing nightly tracheostomy care, cannula fell out, she had some bleeding.  EMS was called and patient was brought to the ED on nasal cannula.  In the ED, patient was afebrile, oxygen saturation more than 90 on 4 L by nasal cannula.  Hemodynamically stable Labs with glucose 291, creatinine 1.24.  CBC unremarkable.  PCCM saw the patient in the ED.  Per their note, patient's trach stoma looked completely occluded, closed to sealed.  But she was not in respiratory distress.  It was deemed very risky to push the tracheostomy.  Patient was kept on observation under hospitalist service ENT consultation was obtained.  Patient underwent a new tracheostomy tube placement by Dr. Janace Hoard on 1/15.  Monitored in ICU  overnight.  No complication.  Stable for discharge today.  Subjective: Patient was seen and examined this morning.  Pleasant elderly Caucasian female.  Lying down on bed.  On vent through new tracheostomy tube.   Daughter at bedside.  Assessment/Plan: Tracheostomy complication -Patient had a tracheostomy tube and uses ventilator at night -Brought into the hospital after tube dislodged. -Underwent new tracheostomy tube placement by ENT Dr. Janace Hoard. -Per note, #6 cuffed Shiley was placed. She previously had a size 8 cuffed.  -PCCM consult appreciated as well. -Continue trach collar during waking hours and vent with sleep.  -No changes in home care regimen -home with at least one spare trach and inner cannulas and a new speaking valve. -She has an appointment with the trach clinic on 1/23 at which point she can have her trach sutures removed.    OSA Chronic hypoxic and hypercarbic respiratory failure BiPAP intolerance -On nasal cannula during the day and was on ventilator through trach at night.   -Unable to continue ventilator at this time because of stoma closed   Atrial fibrillation  -Continue amiodarone, metoprolol, Eliquis    AKI on CKD2 -Serum creatinine 0.93 from August.  Was 1.24 on admission.  Improved to normal.  Lasix can be resumed post discharge. Recent Labs    02/08/21 0434 02/09/21 1246 02/10/21 0156 02/11/21 0301 02/12/21 0459 02/13/21 0149 02/14/21 0141 07/18/21 0247 07/18/21 0503 07/19/21 0417  BUN 29* 25* 27* 25* 24* 26* 26* 23 22 23   CREATININE 0.81 0.92 0.82 0.79 0.78 0.85 0.93 1.24* 1.09* 1.06*   HFpEF   -EF was 45-50% in July 2022  -  Appears compensated  -continue beta-blocker,, Lasix.  Type 2 diabetes mellitus with hyperglycemia -A1c 7.6 on 11/22/2020 -Blood sugar level elevated to over 200 this morning. -Sugar level is currently stable on 15 units of Lantus nightly. Recent Labs  Lab 07/18/21 1636 07/18/21 1910 07/18/21 2311 07/19/21 0306  07/19/21 0808  GLUCAP 202* 186* 156* 94 98   Depression, anxiety  -Continue Cymbalta, Zoloft, low-dose Ativan as-needed    Dementia  -Continue Aricept, use delirium precautions in hospital     Seizure disorder  -Continue Keppra    Mobility: Encourage ambulation Living condition: Lives at home with daughter Goals of care:   Code Status: Full Code  Nutritional status: Body mass index is 30.39 kg/m.       Discharge Medications:   Allergies as of 07/19/2021       Reactions   Citalopram Hydrobromide Other (See Comments)   Dyskinesia Other reaction(s): Other (See Comments) Dyskinesia   Codeine Nausea And Vomiting, Other (See Comments)   HALLUCINATIONS Other reaction(s): Unknown Other reaction(s): Other (See Comments) HALLUCINATIONS   Hydromorphone Hcl Other (See Comments)   Made her pass out Other reaction(s): Other (See Comments) Made her pass out   Metoclopramide Other (See Comments)   DYSKINESIA Other reaction(s): Other (See Comments) DYSKINESIA   Hyoscyamine Other (See Comments)   MADE DIARRHEA WORSE        Medication List     STOP taking these medications    acetaminophen 500 MG tablet Commonly known as: TYLENOL   benzonatate 100 MG capsule Commonly known as: TESSALON   dicyclomine 20 MG tablet Commonly known as: BENTYL   donepezil 5 MG disintegrating tablet Commonly known as: ARICEPT ODT Replaced by: donepezil 5 MG tablet   fiber Pack packet   free water Soln   ipratropium-albuterol 0.5-2.5 (3) MG/3ML Soln Commonly known as: DUONEB   loperamide HCl 1 MG/7.5ML suspension Commonly known as: IMODIUM   meclizine 12.5 MG tablet Commonly known as: ANTIVERT   ondansetron 4 MG tablet Commonly known as: ZOFRAN   potassium chloride 20 MEQ packet Commonly known as: KLOR-CON       TAKE these medications    amiodarone 200 MG tablet Commonly known as: PACERONE Take 1 tablet (200 mg total) by mouth daily. Start taking on: July 20, 2021   apixaban 5 MG Tabs tablet Commonly known as: Eliquis Take 1 tablet (5 mg total) by mouth 2 (two) times daily.   atorvastatin 80 MG tablet Commonly known as: LIPITOR Take 1 tablet (80 mg total) by mouth daily. Start taking on: July 20, 2021   donepezil 5 MG tablet Commonly known as: ARICEPT Take 1 tablet (5 mg total) by mouth at bedtime. Replaces: donepezil 5 MG disintegrating tablet   DULoxetine 30 MG capsule Commonly known as: CYMBALTA Take 1 capsule (30 mg total) by mouth daily. Start taking on: July 20, 2021 What changed: when to take this   feeding supplement Liqd Take 237 mLs by mouth 2 (two) times daily between meals. What changed: Another medication with the same name was removed. Continue taking this medication, and follow the directions you see here.   furosemide 40 MG tablet Commonly known as: LASIX Take 1 tablet (40 mg total) by mouth daily.   gabapentin 100 MG capsule Commonly known as: NEURONTIN Take 1 capsule (100 mg total) by mouth 3 (three) times daily.   insulin glargine 100 UNIT/ML injection Commonly known as: LANTUS Inject 0.15 mLs (15 Units total) into the skin daily. What changed: how  much to take   levETIRAcetam 500 MG tablet Commonly known as: KEPPRA Take 500 mg by mouth 2 (two) times daily. What changed: Another medication with the same name was removed. Continue taking this medication, and follow the directions you see here.   levothyroxine 175 MCG tablet Commonly known as: SYNTHROID Take 1 tablet (175 mcg total) by mouth daily at 6 (six) AM. Start taking on: July 20, 2021   melatonin 3 MG Tabs tablet Take 1 tablet (3 mg total) by mouth at bedtime as needed. What changed:  how to take this Another medication with the same name was removed. Continue taking this medication, and follow the directions you see here.   metoprolol tartrate 25 MG tablet Commonly known as: LOPRESSOR Take 1 tablet (25 mg total) by mouth 2 (two)  times daily.   pantoprazole 40 MG tablet Commonly known as: PROTONIX Take 1 tablet (40 mg total) by mouth 2 (two) times daily before a meal.   predniSONE 5 MG tablet Commonly known as: DELTASONE Take 5 mg by mouth daily.   sertraline 25 MG tablet Commonly known as: ZOLOFT Take 1 tablet (25 mg total) by mouth daily. Start taking on: July 20, 2021               Discharge Care Instructions  (From admission, onward)           Start     Ordered   07/19/21 0000  Discharge wound care:        07/19/21 1016            Wound care:   Pressure Injury 11/21/20 Sacrum Medial Stage 1 -  Intact skin with non-blanchable redness of a localized area usually over a bony prominence. Stage 1 pressure Ulcer non blanchable (Active)  Date First Assessed/Time First Assessed: 11/21/20 2259   Location: Sacrum  Location Orientation: Medial  Staging: Stage 1 -  Intact skin with non-blanchable redness of a localized area usually over a bony prominence.  Wound Description (Comments): Sta...    Assessments 11/21/2020 10:30 PM 11/21/2020 11:00 PM  Dressing Type Foam - Lift dressing to assess site every shift --  Dressing Clean;Dry;Intact --  Dressing Change Frequency Every 3 days --  State of Healing Closed wound edges --  Site / Wound Assessment Clean;Dry;Red --  Wound Length (cm) -- 0 cm  Wound Width (cm) -- 0 cm  Wound Depth (cm) -- 0 cm  Wound Surface Area (cm^2) -- 0 cm^2  Wound Volume (cm^3) -- 0 cm^3  Margins Attached edges (approximated) --  Drainage Amount None --  Drainage Description No odor --  Treatment Cleansed;Off loading --     No Linked orders to display     Wound / Incision (Open or Dehisced) 12/22/20 Skin tear Arm Anterior;Lower;Right (Active)  Date First Assessed/Time First Assessed: 12/22/20 2000   Wound Type: Skin tear  Location: Arm  Location Orientation: Anterior;Lower;Right    Assessments 12/23/2020  4:00 AM 02/15/2021  8:00 AM  Dressing Type Foam - Lift  dressing to assess site every shift Foam - Lift dressing to assess site every shift  Dressing Changed Changed --  Dressing Status Clean;Dry;Intact Clean;Dry;Intact  Dressing Change Frequency PRN PRN  Site / Wound Assessment Clean --  Peri-wound Assessment Intact --  Margins Attached edges (approximated) --  Closure None --  Drainage Amount Scant --  Drainage Description Serosanguineous --     No Linked orders to display     Wound / Incision (Open  or Dehisced) 01/11/21 Puncture Abdomen Left;Anterior;Upper G-tube insertion site (Active)  Date First Assessed/Time First Assessed: 01/11/21 1519   Wound Type: Puncture  Location: Abdomen  Location Orientation: Left;Anterior;Upper  Wound Description (Comments): G-tube insertion site  Present on Admission: No    Assessments 01/11/2021  3:19 PM 02/15/2021  8:00 AM  Dressing Type Gauze (Comment);Tape dressing Gauze (Comment)  Dressing Changed New --  Dressing Status Dry;Clean;Intact Clean;Dry;Intact  Dressing Change Frequency PRN PRN  Site / Wound Assessment Clean;Dry --  Peri-wound Assessment Intact --  Closure None --  Drainage Amount None --  Treatment Cleansed --     No Linked orders to display     Wound / Incision (Open or Dehisced) 01/29/21 (MASD) Moisture Associated Skin Damage Groin bleeding (Active)  Date First Assessed/Time First Assessed: 01/29/21 2000   Wound Type: (MASD) Moisture Associated Skin Damage  Location: Groin  Wound Description (Comments): bleeding    Assessments 01/29/2021  8:00 PM 02/15/2021  8:00 AM  Dressing Type Moisture barrier None  Site / Wound Assessment Bleeding Clean;Dry  Drainage Amount Minimal --  Drainage Description Sanguineous --  Treatment Cleansed --     No Linked orders to display     Incision (Closed) 07/18/21 Neck Other (Comment) (Active)  Date First Assessed/Time First Assessed: 07/18/21 1252   Location: Neck  Location Orientation: Other (Comment)    Assessments 07/18/2021  1:15 PM 07/19/2021  12:00 AM  Dressing Type Gauze (Comment) Gauze (Comment)  Dressing Clean;Intact;Dry Clean;Dry;Intact  Dressing Change Frequency -- PRN  Site / Wound Assessment Clean;Dry --  Drainage Amount Scant --  Drainage Description Serosanguineous --     No Linked orders to display    Discharge Instructions:   Discharge Instructions     Call MD for:  difficulty breathing, headache or visual disturbances   Complete by: As directed    Call MD for:  extreme fatigue   Complete by: As directed    Call MD for:  hives   Complete by: As directed    Call MD for:  persistant dizziness or light-headedness   Complete by: As directed    Call MD for:  persistant nausea and vomiting   Complete by: As directed    Call MD for:  severe uncontrolled pain   Complete by: As directed    Call MD for:  temperature >100.4   Complete by: As directed    Diet Carb Modified   Complete by: As directed    Discharge instructions   Complete by: As directed    Discharge instructions for diabetes mellitus: Check blood sugar 3 times a day and bedtime at home. If blood sugar running above 200 or less than 70 please call your MD to adjust insulin. If you notice signs and symptoms of hypoglycemia (low blood sugar) like jitteriness, confusion, thirst, tremor and sweating, please check blood sugar, drink sugary drink/biscuits/sweets to increase sugar level and call MD or return to ER.    General discharge instructions:  Follow with Primary MD Lemmie Evens, MD in 7 days   Get CBC/BMP checked in next visit within 1 week by PCP or SNF MD. (We routinely change or add medications that can affect your baseline labs and fluid status, therefore we recommend that you get the mentioned basic workup next visit with your PCP, your PCP may decide not to get them or add new tests based on their clinical decision)  On your next visit with your PCP, please get your medicines reviewed and adjusted.  Please request your PCP  to go  over all hospital tests, procedures, radiology results at the follow up, please get all Hospital records sent to your PCP by signing hospital release before you go home.  Activity: As tolerated with Full fall precautions use walker/cane & assistance as needed  Avoid using any recreational substances like cigarette, tobacco, alcohol, or non-prescribed drug.  If you experience worsening of your admission symptoms, develop shortness of breath, life threatening emergency, suicidal or homicidal thoughts you must seek medical attention immediately by calling 911 or calling your MD immediately  if symptoms less severe.  You must read complete instructions/literature along with all the possible adverse reactions/side effects for all the medicines you take and that have been prescribed to you. Take any new medicine only after you have completely understood and accepted all the possible adverse reactions/side effects.   Do not drive, operate heavy machinery, perform activities at heights, swimming or participation in water activities or provide baby sitting services if your were admitted for syncope or siezures until you have seen by Primary MD or a Neurologist and advised to do so again.  Do not drive when taking Pain medications.  Do not take more than prescribed Pain, Sleep and Anxiety Medications  Wear Seat belts while driving.  Please note You were cared for by a hospitalist during your hospital stay. If you have any questions about your discharge medications or the care you received while you were in the hospital after you are discharged, you can call the unit and asked to speak with the hospitalist on call if the hospitalist that took care of you is not available. Once you are discharged, your primary care physician will handle any further medical issues. Please note that NO REFILLS for any discharge medications will be authorized once you are discharged, as it is imperative that you return to your  primary care physician (or establish a relationship with a primary care physician if you do not have one) for your aftercare needs so that they can reassess your need for medications and monitor your lab values.   Discharge wound care:   Complete by: As directed    Increase activity slowly   Complete by: As directed        Follow ups:    Follow-up Information     Gareth Morgan, MD Follow up.   Specialty: Family Medicine Contact information: [address] [place] [address] [phone]         Antoine Poche, MD .   Specialty: Cardiology Contact information: [address] [place] [address] [phone]                 Discharge Exam:   Vitals:   07/19/21 0700 07/19/21 0800 07/19/21 0900 07/19/21 0935  BP: (!) 152/71 124/75 (!) 144/76   Pulse: 60 (!) 59 (!) 56 64  Resp: (!) 25 19 (!) 22 18  Temp:      TempSrc:      SpO2: 99% 100% 100%   Weight:      Height:        Body mass index is 30.39 kg/m.   General exam: Pleasant, elderly Caucasian female.  Not in physical distress Skin: No rashes, lesions or ulcers. HEENT: Atraumatic, normocephalic, no obvious bleeding.  New tracheostomy tract anteriorly Lungs: Clear to auscultation bilaterally CVS: Regular rate and rhythm, no murmur GI/Abd soft, nontender, nondistended, bowel sound present CNS: Alert, awake, oriented x3 Psychiatry: Anxious Extremities: No pedal  edema, no calf tenderness  Time coordinating discharge: 35 minutes   The results of significant diagnostics from this hospitalization (including imaging, microbiology, ancillary and laboratory) are listed below for reference.    Procedures and Diagnostic Studies:   No results found.   Labs:   Basic Metabolic Panel: Recent Labs  Lab 07/18/21 0247 07/18/21 0503 07/19/21 0417  NA 137 141 142  K 4.1 3.9 3.9  CL 110 108 113*  CO2 21* 22 21*  GLUCOSE 291* 237* 88  BUN 23 22 23   CREATININE 1.24* 1.09* 1.06*   CALCIUM 9.0 9.1 9.1  MG  --  2.0  --    GFR Estimated Creatinine Clearance: 52.8 mL/min (A) (by C-G formula based on SCr of 1.06 mg/dL (H)). Liver Function Tests: No results for input(s): AST, ALT, ALKPHOS, BILITOT, PROT, ALBUMIN in the last 168 hours. No results for input(s): LIPASE, AMYLASE in the last 168 hours. No results for input(s): AMMONIA in the last 168 hours. Coagulation profile Recent Labs  Lab 07/18/21 0247  INR 1.3*    CBC: Recent Labs  Lab 07/18/21 0247 07/18/21 0503 07/19/21 0417  WBC 8.3 7.5 8.6  NEUTROABS 4.5  --   --   HGB 10.0* 9.2* 9.0*  HCT 32.0* 30.4* 29.3*  MCV 94.7 95.9 93.0  PLT 177 171 148*   Cardiac Enzymes: No results for input(s): CKTOTAL, CKMB, CKMBINDEX, TROPONINI in the last 168 hours. BNP: Invalid input(s): POCBNP CBG: Recent Labs  Lab 07/18/21 1636 07/18/21 1910 07/18/21 2311 07/19/21 0306 07/19/21 0808  GLUCAP 202* 186* 156* 94 98   D-Dimer No results for input(s): DDIMER in the last 72 hours. Hgb A1c No results for input(s): HGBA1C in the last 72 hours. Lipid Profile No results for input(s): CHOL, HDL, LDLCALC, TRIG, CHOLHDL, LDLDIRECT in the last 72 hours. Thyroid function studies No results for input(s): TSH, T4TOTAL, T3FREE, THYROIDAB in the last 72 hours.  Invalid input(s): FREET3 Anemia work up No results for input(s): VITAMINB12, FOLATE, FERRITIN, TIBC, IRON, RETICCTPCT in the last 72 hours. Microbiology Recent Results (from the past 240 hour(s))  Resp Panel by RT-PCR (Flu A&B, Covid) Nasopharyngeal Swab     Status: None   Collection Time: 07/18/21  2:43 AM   Specimen: Nasopharyngeal Swab; Nasopharyngeal(NP) swabs in vial transport medium  Result Value Ref Range Status   SARS Coronavirus 2 by RT PCR NEGATIVE NEGATIVE Final    Comment: (NOTE) SARS-CoV-2 target nucleic acids are NOT DETECTED.  The SARS-CoV-2 RNA is generally detectable in upper respiratory specimens during the acute phase of infection. The  lowest concentration of SARS-CoV-2 viral copies this assay can detect is 138 copies/mL. A negative result does not preclude SARS-Cov-2 infection and should not be used as the sole basis for treatment or other patient management decisions. A negative result may occur with  improper specimen collection/handling, submission of specimen other than nasopharyngeal swab, presence of viral mutation(s) within the areas targeted by this assay, and inadequate number of viral copies(<138 copies/mL). A negative result must be combined with clinical observations, patient history, and epidemiological information. The expected result is Negative.  Fact Sheet for Patients:  EntrepreneurPulse.com.au  Fact Sheet for Healthcare Providers:  IncredibleEmployment.be  This test is no t yet approved or cleared by the [place] FDA and  has been authorized for detection and/or diagnosis of SARS-CoV-2 by FDA under an Emergency Use Authorization (EUA). This EUA will remain  in effect (meaning this test can be used) for the duration of the  COVID-19 declaration under Section 564(b)(1) of the Act, 21 U.S.C.section 360bbb-3(b)(1), unless the authorization is terminated  or revoked sooner.       Influenza A by PCR NEGATIVE NEGATIVE Final   Influenza B by PCR NEGATIVE NEGATIVE Final    Comment: (NOTE) The Xpert Xpress SARS-CoV-2/FLU/RSV plus assay is intended as an aid in the diagnosis of influenza from Nasopharyngeal swab specimens and should not be used as a sole basis for treatment. Nasal washings and aspirates are unacceptable for Xpert Xpress SARS-CoV-2/FLU/RSV testing.  Fact Sheet for Patients: EntrepreneurPulse.com.au  Fact Sheet for Healthcare Providers: IncredibleEmployment.be  This test is not yet approved or cleared by the [place] FDA and has been authorized for detection and/or diagnosis of SARS-CoV-2 by FDA under  an Emergency Use Authorization (EUA). This EUA will remain in effect (meaning this test can be used) for the duration of the COVID-19 declaration under Section 564(b)(1) of the Act, 21 U.S.C. section 360bbb-3(b)(1), unless the authorization is terminated or revoked.  Performed at [facility], [place] [address], [place], [place] [address]   MRSA Next Gen by PCR, Nasal     Status: None   Collection Time: 07/18/21  4:51 PM   Specimen: Nasal Mucosa; Nasal Swab  Result Value Ref Range Status   MRSA by PCR Next Gen NOT DETECTED NOT DETECTED Final    Comment: (NOTE) The GeneXpert MRSA Assay (FDA approved for NASAL specimens only), is one component of a comprehensive MRSA colonization surveillance program. It is not intended to diagnose MRSA infection nor to guide or monitor treatment for MRSA infections. Test performance is not FDA approved in patients less than 28 years old. Performed at [facility], [place] [address], [place], [place] [address]      Signed: Terrilee Croak  [facility] 07/19/2021, 10:16 AM

## 2021-07-26 ENCOUNTER — Ambulatory Visit (HOSPITAL_COMMUNITY)
Admission: RE | Admit: 2021-07-26 | Discharge: 2021-07-26 | Disposition: A | Discharge status: Home / Self Care | Payer: HMO | Source: Ambulatory Visit | Attending: Acute Care | Admitting: Acute Care

## 2021-07-26 ENCOUNTER — Other Ambulatory Visit: Payer: Self-pay

## 2021-07-26 DIAGNOSIS — Z430 Encounter for attention to tracheostomy: Secondary | ICD-10-CM | POA: Diagnosis present

## 2021-07-26 NOTE — Progress Notes (Signed)
Tracheostomy Procedure Note  [name] [id number] Jan 05, 1951  Pre Procedure Tracheostomy Information  Trach Brand: Shiley Size:  6.0    [id number] Style: Cuffed Secured by: Velcro   Procedure: Observation only    Post Procedure Tracheostomy Information  Trach Brand: Shiley Size:  6.0   [id number] Style: Cuffed Secured by: Velcro   Post Procedure Evaluation:   Education: New trach style education  Prescription needs: NP will order supplies for #6.0 trach thru [facility]    Additional needs: none

## 2021-07-28 NOTE — Progress Notes (Signed)
Seen briefly as hospital follow up  No issues w/ trach.  I did assist w/ tie change  Trach not due to be changed for 3 weeks. I would prefer that we allow for that to ensure the revision has time to mature.   New DME orders placed.  Simonne Martinet ACNP-BC [facility] Pulmonary/Critical Care Pager # [phone] OR # [phone] if no answer

## 2021-07-29 ENCOUNTER — Other Ambulatory Visit: Payer: Self-pay | Admitting: Gastroenterology

## 2021-07-29 NOTE — Telephone Encounter (Signed)
Patient needs an OV for additional refills as previously recommended.

## 2021-08-02 NOTE — Telephone Encounter (Signed)
LMOM for pt to call office back 

## 2021-08-04 ENCOUNTER — Encounter: Payer: Self-pay | Admitting: *Deleted

## 2021-08-04 NOTE — Telephone Encounter (Signed)
Tried several times to reach pt. Mailed a letter.  

## 2021-08-04 NOTE — Telephone Encounter (Signed)
Noted  

## 2021-08-10 ENCOUNTER — Institutional Professional Consult (permissible substitution): Payer: PPO | Admitting: Internal Medicine

## 2021-08-16 ENCOUNTER — Telehealth: Payer: Self-pay | Admitting: *Deleted

## 2021-08-16 NOTE — Telephone Encounter (Signed)
Pt would like refill on Dicyclomine.  Has upcoming appt in March but is out.

## 2021-08-17 MED ORDER — DICYCLOMINE HCL 10 MG PO CAPS: 10.0000 mg | ORAL_CAPSULE | Freq: Three times a day (TID) | ORAL | 1 refills | Status: DC

## 2021-08-17 NOTE — Addendum Note (Signed)
Addended by: Gelene Mink on: 08/17/2021 12:29 PM   Modules accepted: Orders

## 2021-08-20 ENCOUNTER — Institutional Professional Consult (permissible substitution): Payer: PPO | Admitting: Internal Medicine

## 2021-08-20 NOTE — Progress Notes (Unsigned)
Subjective:    Patient ID: [name], female    DOB: 07/14/1950,   MRN: [id number]    Brief patient profile:  38 yowf quit smoking 1984 no symptoms referred by Dr Aundra Dubin to pulmonary clinic 04/21/2015 with abn pfts p starting amiodarone  01/29/2015 for CAF     History of Present Illness  04/21/2015 1st [facility] office visit/ [name]   Chief Complaint  Patient presents with   Pulmonary Consult    Referred by Dr. Loralie Champagne for eval of abnormal PFT's . Pt had PFT done to est baseline before starting amiodarone. She c/o SOB and cough for the past 3 months. She states that she is SOB somtimes just sitting and walking short distances. Her cough is prod with clear sputum, mainly in the am.   able to [employer] pushing cart slow pace s sob  Cough x 2 years    Carries dx of gastroparesis on marinol and gaining wt  Cough is worst first thing in am x years  rec Add pepcid 20 mg at bedtime  GERD diet     07/22/2015  f/u ov/[name] re:  ? Amiodarone tox  Chief Complaint  Patient presents with   Follow-up    PFT done today. Pt states her breathing has improved. She still c/o occ cough with clear sputum.   cough much better in am since adding pepcid at hs/ limited by knee with activity/ not sob  rec Walk as much as your knee will allow Return for increase cough or difficulty with breathing with exertion  Please schedule a follow up visit in 6  months but call sooner if needed with pfts on return - cancel if you stop amiodarone in the meantime   01/22/2016  f/u ov/[name] re: doe on amiodarone  Chief Complaint  Patient presents with   Follow-up    PFT done today. Breathing is doing well. No new co's today.   limited more by back than knees  rec Please schedule a follow up visit in 6 months but call sooner if needed with cxr / pfts on return unless you stop the amiodarone in the meantime > did not do     Admit date: 11/25/2017 Discharge date: 11/29/2017   1. Acute on chronic  diastolic CHF: presented with weight gain and LE edema. Diuresed with IV lasix with UOP net -1.4L this admission. Weight 237>229lbs on the day of discharge.  Cr stable - 1.08 on the day of discharge.  - Discharged home on demadex 40mg  in AM and 20mg  in PM.    2. Secondary pulmonary HTN: echo this admission with PA pressure 33mmHg, improved from previous.  - Recommend continued management of sleep apnea   3. Paroxysmal atrial fibrillation: maintained sinus rhythm this admission - Continued amiodarone and eliquis.    4. DM type 2: maintained on ISS inpatient - Continued home medications at discharge        12/08/2017  Extended f/u ov/[name] re: re-esatablish re doe/  still on amiodarone on 02 2lpm / none at rest and 2lpm outside the house ok  Chief Complaint  Patient presents with   Follow-up    Per Dr Idolina Primer. Pt states had PNA in Feb 2019 and then had to be intubated and then ended up needing a trach.  She had trach removed in 09/10/17.  She has been having some swelling in her legs.  She has occ SOB with "alot of activity".    Dyspnea:  Some grocery  shopping on 2lpm  Cough: none Sleep: 2lpm hs ok  Leg swelling severity  correlates well with   Sob Rec   08/20/2021  Re-establish  ov/[facility] office/[name] re: chronic hyoxemia/ hypercarbia maint on ***  No chief complaint on file.   Dyspnea:  *** Cough: *** Sleeping: *** SABA use: *** 02: *** Covid status: *** Lung cancer screening: ***   No obvious day to day or daytime variability or assoc excess/ purulent sputum or mucus plugs or hemoptysis or cp or chest tightness, subjective wheeze or overt sinus or hb symptoms.   *** without nocturnal  or early am exacerbation  of respiratory  c/o's or need for noct saba. Also denies any obvious fluctuation of symptoms with weather or environmental changes or other aggravating or alleviating factors except as outlined above   No unusual exposure hx or h/o childhood pna/ asthma or knowledge of  premature birth.  Current Allergies, Complete Past Medical History, Past Surgical History, Family History, and Social History were reviewed in [employer] record.  ROS  The following are not active complaints unless bolded Hoarseness, sore throat, dysphagia, dental problems, itching, sneezing,  nasal congestion or discharge of excess mucus or purulent secretions, ear ache,   fever, chills, sweats, unintended wt loss or wt gain, classically pleuritic or exertional cp,  orthopnea pnd or arm/hand swelling  or leg swelling, presyncope, palpitations, abdominal pain, anorexia, nausea, vomiting, diarrhea  or change in bowel habits or change in bladder habits, change in stools or change in urine, dysuria, hematuria,  rash, arthralgias, visual complaints, headache, numbness, weakness or ataxia or problems with walking or coordination,  change in mood or  memory.        No outpatient medications have been marked as taking for the 08/20/21 encounter (Appointment) with Tanda Rockers, MD.                           Objective:   Physical Exam    08/20/2021         ***   07/22/2015       202  > 01/22/2016    234 > 12/08/2017   234  04/21/15 189 lb 3.2 oz (85.821 kg)  03/20/15 174 lb (78.926 kg)  03/16/15 172 lb (78.019 kg)     Vital signs reviewed  08/20/2021  - Note at rest 02 sats  ***% on ***   General appearance:    ***      Min bilateral lower ext edema s pitting***                Assessment & Plan:

## 2021-09-01 NOTE — Progress Notes (Signed)
Primary Care Physician:  Lemmie Evens, MD  Primary GI: Dr. Abbey Chatters  Patient Location: Home   Provider Location: [facility] office   Reason for Visit: Follow-up, medication refills.   Persons present on the virtual encounter, with roles: [name] (patient), Dione Booze (patient's daughter), Aliene Altes, PA-C (provider).   Total time (minutes) spent on medical discussion: 21 minutes  Virtual Visit via video note Due to COVID-19, visit is conducted virtually and was requested by patient.   I connected with [name] on 09/02/21 at 11:30 AM EST by video and verified that I am speaking with the correct person using two identifiers.   I discussed the limitations, risks, security and privacy concerns of performing an evaluation and management service by video and the availability of in person appointments. I also discussed with the patient that there may be a patient responsible charge related to this service. The patient expressed understanding and agreed to proceed.  Chief Complaint  Patient presents with   Follow-up   History of Present Illness: 71 year old female with history of IBS with diarrhea, gastroparesis, IDA previously following with hematology and receiving IV iron due to oral iron intolerance with last infusion in 2019. EGD in May 2016 due to anemia and history of Barrett's without evidence of Barrett's. Last colonoscopy in May 2015. BPE has noted small to moderate sized sliding type hiatal hernia with spontaneous and inducible reflux, nonspecific esophageal dysmotility. Capsule in April 2015 with idiopathic small bowel ulcers that have been present since 2013.   Last seen in our office in December 2021.  Diarrhea was controlled with dicyclomine and Lactaid.  Her main complaint is chronic lower abdominal pain, worsened postprandially.  Associated food aversion and weight loss.  No rectal bleeding or melena.  Plan for mesenteric ultrasound.  States she may need CT angiogram  in the future.  Mesenteric ultrasound did not reveal any hemodynamically significant arterial stenosis.  Interval history:  Admitted in June 2022 with CHF exacerbation and respiratory failure in the setting of untreated lung disease and pneumonia, transferred to [facility].  Failed BiPAP and required intubation.  Course complicated by prolonged mechanical ventilation requiring tracheostomy on 7/7. She also had dysphagia and PEG was placed. Discharged with home vent.  Gastrostomy tube was removed in October 2022. Admitted in January 2023 after tracheostomy tube could not be reinserted or change.  Tracheostomy tube was placed.  Today:  Daughter provides most of the history due to patient's sore throat, but patient is sitting with her daughter and agreeing with what is being said.   IBS-D: Occasional diarrhea, but overall doing very well. Taking Bentyl 20 mg TID for over a year. Bowels moving once daily to every other day. Avoiding milk. Eats eggs, cheese, and this doesn't seen to bother her. No brbpr or melena. No abdominal pain.  Denies nausea or vomiting.  Taking Protonix BID reflux is well controlled.  This is currently prescribed by PCP.  Denies dysphagia.  Trying to wean off O2 during the day. Having more respiratory issues with recurrent infections. Last in October. On a ventilator at night.   IDA:  Most recent hemoglobin 07/19/2021 stable at 9.0 with normocytic indices.  No overt bleeding.  Planning to see hematologist soon. Needs to call to make an appointment.   CT in July 2022 without contrast with slight nodular appearance of the liver.  Spleen normal.  LFTs normal. Occasional isolated thrombocytopenia.  Platelets January 2023 were 148.  Patient denies any history of liver disease  or family history of liver disease. Denies alcohol. Drank occasionally about 30 years ago. No history of IV drug use. Has some swelling in her legs, but this is chronic and stable. Always below her knees. No swelling  currently. Takes lasix 40 daily. No mental status changes.   Past Medical History:  Diagnosis Date   Abnormal pulmonary function test    Anemia    H/H of 10/30 with a normal MCV in 12/09   Anxiety    Arthritis    Barrett's esophagus    Diagnosed 1995. Last EGD 2016-NO BARRETT'S.    Chest pain    a. 2022 Cath: nl cors; b. 2008 Neg myoview; c. 01/2014 Cath: Nl cors. EF 40%; c. 08/2020 NSTEMI/[facility]): nl cors.   Chronic anticoagulation    Chronic combined systolic and diastolic CHF (congestive heart failure) ([place])    a. 10/2014 Echo: EF40%; b. 01/2015 Echo: EF 55-60%; c. 11/2020 Echo: EF 55-60%, mod LVH, sev BAE, mild MR, mod TR. PASP 4mmHg; d. 01/2021 Echo: EF 45-50%, glob HK, mod conc LVH, mod reduced RV fxn, RVSP 60.53mmHg, Mod TR, mild to mod MR, sev dil LA.   Chronic LBP    Surgical intervention in 1996   Diabetes mellitus, type 2 ([place])    Insulin therapy; exacerbated by prednisone   Gastroparesis    99% retention 05/2008 on GES   GERD (gastroesophageal reflux disease)    Heart attack ([place]) 08/13/2020   Hiatal hernia    Hyperlipidemia    Hypertension    Hypothyroid    IBS (irritable bowel syndrome)    Obesity    OSA on CPAP    had CPAP and cannot tolerate.   Paroxysmal atrial fibrillation ([facility])    a.CHA2DS2VASc = 6-->eliquis. Also on Amio.   Pulmonary hypertension ([place]) 01/2015   a. 01/2015 RHC: Predominantly pulmonary venous hypertension but may be component of PAH (PA mean 52, PCWP 31); b. 01/2021 Echo: RVSP 60.24mmHg w/ mod reduced RV fxn.   Seizures ([place])    last seizure was 2 years ago; on keppra for this; unknown etiology   Syncope    a. Admitted 05/2009; magnetic resonance imagin/ MRA - negative; etiology thought to be orthostasis secondary to drugs and dehydration. b. Syncope 02/2015 also felt 2/2 dehydration.     Past Surgical History:  Procedure Laterality Date   BACK SURGERY  1996   BIOPSY N/A 11/08/2013   Procedure: BIOPSY  / Tissue sampling / ulcers present in  small intestine;  Surgeon: Danie Binder, MD;  [facility];  Service: Endoscopy;  Laterality: N/A;   CARDIAC CATHETERIZATION  2002   CARDIAC CATHETERIZATION N/A 01/26/2015   Procedure: Right Heart Cath;  Surgeon: Larey Dresser, MD;  Location: [facility] CV LAB;  Service: Cardiovascular;  Laterality: N/A;   CARDIOVASCULAR STRESS TEST  2008   Stress nuclear study   CARDIOVERSION N/A 03/06/2015   Procedure: CARDIOVERSION;  Surgeon: Larey Dresser, MD;  Location: [place];  Service: Cardiovascular;  Laterality: N/A;   CARPAL TUNNEL RELEASE  1994   COLONOSCOPY  11/2011   Dr. Oneida Alar: Internal hemorrhoids, mild diverticulosis. Random colon biopsies negative.   COLONOSCOPY N/A 11/08/2013   SLF: Normal mucosa in the terminal ileum/The colon IS redundant/  Moderate diverticulosis throughout the entire colon. ileum bx benign. colon bx benign   CRYOTHERAPY  01/23/2021   Procedure: CRYOTHERAPY;  Surgeon: Candee Furbish, MD;  Location: [facility] ENDOSCOPY;  Service: Pulmonary;;   Cydney Ok  01/24/2021  Procedure: CRYOTHERAPY;  Surgeon: Candee Furbish, MD;  Location: [facility] ENDOSCOPY;  Service: Pulmonary;;   CRYOTHERAPY  01/26/2021   Procedure: Cydney Ok;  Surgeon: Juanito Doom, MD;  Location: [facility] ENDOSCOPY;  Service: Cardiopulmonary;;   ESOPHAGOGASTRODUODENOSCOPY  2008   Barrett's without dysplasia. esphagus dilated. antral erosions, h.pylori serologies negative.   ESOPHAGOGASTRODUODENOSCOPY  11/2011   Dr. Oneida Alar: Barrett's esophagus, mild gastritis, diverticulum in the second portion of the duodenum repeat EGD 3 years. Small bowel biopsies negative. Gastric biopsy show reactive gastropathy but no H. pylori. Esophageal biopsies consistent with GERD. Next EGD 11/2014   ESOPHAGOGASTRODUODENOSCOPY N/A 11/21/2014   [id number] non-erosive gastritis/irregular z-line   FOREIGN BODY REMOVAL  01/23/2021   Procedure: FOREIGN BODY REMOVAL;  Surgeon: Candee Furbish, MD;  Location: [place];  Service:  Pulmonary;;   FOREIGN BODY REMOVAL  01/24/2021   Procedure: FOREIGN BODY REMOVAL;  Surgeon: Candee Furbish, MD;  Location: [facility] ENDOSCOPY;  Service: Pulmonary;;   GIVENS CAPSULE STUDY  12/07/2011   Proximal small bowel, rare AVM. Distal small bowel, multiple ulcers noted   GIVENS CAPSULE STUDY N/A 09/27/2013   Distal small bowel ulcers extending to TI.   GIVENS CAPSULE STUDY N/A 10/10/2013   Procedure: GIVENS CAPSULE STUDY;  Surgeon: Danie Binder, MD;  [facility];  Service: Endoscopy;  Laterality: N/A;  7:30   HEMOSTASIS CONTROL  01/24/2021   Procedure: HEMOSTASIS CONTROL;  Surgeon: Candee Furbish, MD;  Location: [facility] ENDOSCOPY;  Service: Pulmonary;;   HEMOSTASIS CONTROL  01/26/2021   Procedure: HEMOSTASIS CONTROL;  Surgeon: Juanito Doom, MD;  Location: [facility] ENDOSCOPY;  Service: Cardiopulmonary;;   IR GASTROSTOMY TUBE MOD SED  01/11/2021   IR GASTROSTOMY TUBE REMOVAL  04/13/2021   IR KYPHO THORACIC WITH BONE BIOPSY  02/09/2018   KNEE ARTHROSCOPY WITH MEDIAL MENISECTOMY Right 06/09/2016   Procedure: KNEE ARTHROSCOPY WITH MEDIAL MENISECTOMY;  Surgeon: Carole Civil, MD;  Location: [facility];  Service: Orthopedics;  Laterality: Right;  medial and lateral menisectomy   LAMINECTOMY  1995   L4-L5   LAPAROSCOPIC CHOLECYSTECTOMY  1990s   LEFT HEART CATHETERIZATION WITH CORONARY ANGIOGRAM  01/10/2014   Procedure: LEFT HEART CATHETERIZATION WITH CORONARY ANGIOGRAM;  Surgeon: Sinclair Grooms, MD;  Location: [facility] CATH LAB;  Service: Cardiovascular;;   RIGHT HEART CATHETERIZATION N/A 01/10/2014   Procedure: RIGHT HEART CATH;  Surgeon: Sinclair Grooms, MD;  Location: [facility] CATH LAB;  Service: Cardiovascular;  Laterality: N/A;   TOTAL ABDOMINAL HYSTERECTOMY  1999   TRACHEOSTOMY REVISION N/A 07/18/2021   Procedure: TRACHEOSTOMY REVISION;  Surgeon: Melissa Montane, MD;  Location: [place];  Service: ENT;  Laterality: N/A;   TRACHEOSTOMY TUBE PLACEMENT N/A 09/01/2017   Procedure: TRACHEOSTOMY;  Surgeon: Rozetta Nunnery, MD;  Location: [place];  Service: ENT;  Laterality: N/A;   VIDEO BRONCHOSCOPY Bilateral 01/23/2021   Procedure: VIDEO BRONCHOSCOPY WITHOUT FLUORO;  Surgeon: Candee Furbish, MD;  Location: [facility] ENDOSCOPY;  Service: Pulmonary;  Laterality: Bilateral;   VIDEO BRONCHOSCOPY Bilateral 01/24/2021   Procedure: VIDEO BRONCHOSCOPY WITHOUT FLUORO;  Surgeon: Candee Furbish, MD;  Location: [facility] ENDOSCOPY;  Service: Pulmonary;  Laterality: Bilateral;   VIDEO BRONCHOSCOPY N/A 01/26/2021   Procedure: VIDEO BRONCHOSCOPY WITHOUT FLUORO;  Surgeon: Juanito Doom, MD;  Location: [place];  Service: Cardiopulmonary;  Laterality: N/A;     Current Meds  Medication Sig   amiodarone (PACERONE) 200 MG tablet Take 1 tablet (200 mg total) by mouth daily.   apixaban (  ELIQUIS) 5 MG TABS tablet Take 1 tablet (5 mg total) by mouth 2 (two) times daily.   atorvastatin (LIPITOR) 80 MG tablet Take 1 tablet (80 mg total) by mouth daily.   donepezil (ARICEPT) 5 MG tablet Take 1 tablet (5 mg total) by mouth at bedtime.   DULoxetine (CYMBALTA) 30 MG capsule Take 1 capsule (30 mg total) by mouth daily.   feeding supplement (ENSURE ENLIVE / ENSURE PLUS) LIQD Take 237 mLs by mouth 2 (two) times daily between meals.   furosemide (LASIX) 40 MG tablet Take 1 tablet (40 mg total) by mouth daily.   gabapentin (NEURONTIN) 100 MG capsule Take 1 capsule (100 mg total) by mouth 3 (three) times daily.   insulin glargine (LANTUS) 100 UNIT/ML injection Inject 0.15 mLs (15 Units total) into the skin daily.   levETIRAcetam (KEPPRA) 500 MG tablet Take 500 mg by mouth 2 (two) times daily.   levothyroxine (SYNTHROID) 175 MCG tablet Take 1 tablet (175 mcg total) by mouth daily at 6 (six) AM.   LORazepam (ATIVAN) 1 MG tablet Take 1 mg by mouth at bedtime.   melatonin 3 MG TABS tablet Take 1 tablet (3 mg total) by mouth at bedtime as needed.   metoprolol tartrate (LOPRESSOR) 25 MG tablet Take 1 tablet (25 mg total) by mouth 2 (two) times  daily.   pantoprazole (PROTONIX) 40 MG tablet Take 1 tablet (40 mg total) by mouth 2 (two) times daily before a meal.   predniSONE (DELTASONE) 5 MG tablet Take 5 mg by mouth daily.   sertraline (ZOLOFT) 25 MG tablet Take 1 tablet (25 mg total) by mouth daily.   [DISCONTINUED] dicyclomine (BENTYL) 10 MG capsule Take 1 capsule (10 mg total) by mouth 4 (four) times daily -  before meals and at bedtime. Stop if constipation (Patient taking differently: Take 20 mg by mouth 4 (four) times daily -  before meals and at bedtime. Stop if constipation)     Family History  Problem Relation Age of Onset   Hypertension Mother    Alzheimer's disease Mother    Stroke Mother    Heart attack Mother    Breast cancer Sister    Hypertension Other    Heart disease Neg Hx    Colon cancer Neg Hx    Liver disease Neg Hx     Social History   Socioeconomic History   Marital status: Married    Spouse name: Not on file   Number of children: Not on file   Years of education: Not on file   Highest education level: Not on file  Occupational History   Occupation: Museum/gallery curator at [employer]    Employer: [employer]: disabled    Employer: RETIRED  Tobacco Use   Smoking status: Former    Packs/day: 0.25    Years: 15.00    Pack years: 3.75    Types: Cigarettes    Start date: 02/26/1966    Quit date: 07/01/1983    Years since quitting: 38.2   Smokeless tobacco: Never   Tobacco comments:    quit in 1984  Vaping Use   Vaping Use: Never used  Substance and Sexual Activity   Alcohol use: Not Currently    Comment: Occasional alcohol 30 years ago (09/02/21)   Drug use: No   Sexual activity: Yes    Birth control/protection: None, Surgical  Other Topics Concern   Not on file  Social History Narrative   Sedentary   4 children, "  blended family"   Social Determinants of Health   Financial Resource Strain: Not on file  Food Insecurity: Not on file  Transportation Needs: Not on file  Physical Activity:  Not on file  Stress: Not on file  Social Connections: Not on file       Review of Systems: Gen: Denies fever, chills, cold or flulike symptoms, presyncope, syncope. CV: Denies chest pain, palpitations. Resp: See HPI GI: see HPI Heme: See HPI  Observations/Objective: Frail appearing elderly lady with trach collar. In no distress. Alert and oriented. Pleasant. Normal mood and affect. Unable to perform complete physical exam due to video encounter.    Assessment:  71 year old female with history of IBS with diarrhea, gastroparesis, IDA previously following with hematology and receiving IV iron, history of CHF, restrictive lung disease, hospitalized in June 2022 with respiratory failure and required tracheostomy which remains in place and requires ventilator at night, presenting today for follow-up with me for medication refills.  IBS:  Chronic. Doing very well.  Currently with 1 bowel movement daily to every other day.  Significant abdominal pain and no alarm symptoms.  Currently taking Bentyl 20 mg 3 times daily and avoiding milk.  Not sure who increased her dose of bentyl to 20 mg from 10 mg.  Discussed concerns about Bentyl in elderly patients.  Recommended we try decreasing the dose back to 10 mg 3 times a day before meals and at bedtime with a goal of minimizing this medication is much as possible as we move forward. Advised if she continues to do well with decreasing Bentyl to 10 mg 3 times a day after 2 to 3 weeks, she can try limiting 1 dose every 2 weeks and monitor for recurrent symptoms.  Advised to use Imodium as needed.  Ultimately, would like to transition to Imodium only if we are able. Encouragingly patient is not having any significant side effects from Bentyl currently.   GERD: Well-controlled on Protonix 40 mg twice daily.  Currently managed by PCP.  IDA: Previously following with hematology and receiving IV iron due to oral iron intolerance with last infusion in 2019. EGD  in May 2016 due to anemia and history of Barrett's without evidence of Barrett's, mild nonerosive gastritis. Last colonoscopy in May 2015 with diverticulosis. Capsule in April 2015 with idiopathic small bowel ulcers that have been present since 2013.  Most recent hemoglobin in January was stable at 9.0 with normocytic indices.  Denies overt GI bleeding.  She has not seen hematology since 2019 but plans to call make an appointment.  States she was lost to follow-up since COVID.  Abnormal CT liver:  Slight nodular appearance of the liver on CT in July 2022.  Spleen was normal.  LFTs normal.  She had occasional isolated thrombocytopenia, but nothing persistent.  No known history of liver disease previously and no family history of liver disease.  No alcohol.  Drinks occasionally 30 years ago.  No history of IV drug use.  Chronic mild lower extremity edema well controlled with Lasix 40 mg daily.  No abdominal distention or mental status changes.  Plan for ultrasound with elastography to evaluate further.    Plan: Ultrasound abdomen complete with elastography. Decrease Bentyl to 10 mg 3 times daily before meals and at bedtime as needed.  If she continues to do well in 2 to 3 weeks, advise she can continue to try elevating 1 dose every 2 weeks to see how she does.  Counseled on side effects of  Bentyl to monitor for. Use Imodium as needed for occasional diarrhea.  Ultimately, would like to transition to using Imodium only if we are able.   Continue Protonix 40 mg twice daily as prescribed by PCP. Recommended for her daughter to call and schedule follow-up with hematology for IDA. Follow-up in 6 months or sooner if needed.    I discussed the assessment and treatment plan with the patient. The patient was provided an opportunity to ask questions and all were answered. The patient agreed with the plan and demonstrated an understanding of the instructions.   The patient was advised to call back or seek an  in-person evaluation if the symptoms worsen or if the condition fails to improve as anticipated.  I provided 21 minutes of video-face-to-face time during this encounter.  Aliene Altes, PA-C [facility]

## 2021-09-02 ENCOUNTER — Ambulatory Visit (HOSPITAL_COMMUNITY)
Admission: RE | Admit: 2021-09-02 | Discharge: 2021-09-02 | Disposition: A | Discharge status: Home / Self Care | Payer: HMO | Source: Ambulatory Visit | Attending: Acute Care | Admitting: Acute Care

## 2021-09-02 ENCOUNTER — Telehealth: Payer: Self-pay | Admitting: *Deleted

## 2021-09-02 ENCOUNTER — Encounter: Payer: Self-pay | Admitting: Gastroenterology

## 2021-09-02 ENCOUNTER — Other Ambulatory Visit: Payer: Self-pay

## 2021-09-02 ENCOUNTER — Telehealth (INDEPENDENT_AMBULATORY_CARE_PROVIDER_SITE_OTHER): Payer: HMO | Admitting: Gastroenterology

## 2021-09-02 ENCOUNTER — Telehealth: Payer: HMO | Admitting: Gastroenterology

## 2021-09-02 VITALS — Ht 66.0 in | Wt 187.0 lb

## 2021-09-02 DIAGNOSIS — K219 Gastro-esophageal reflux disease without esophagitis: Secondary | ICD-10-CM

## 2021-09-02 DIAGNOSIS — N182 Chronic kidney disease, stage 2 (mild): Secondary | ICD-10-CM | POA: Insufficient documentation

## 2021-09-02 DIAGNOSIS — E662 Morbid (severe) obesity with alveolar hypoventilation: Secondary | ICD-10-CM | POA: Diagnosis not present

## 2021-09-02 DIAGNOSIS — I4891 Unspecified atrial fibrillation: Secondary | ICD-10-CM | POA: Insufficient documentation

## 2021-09-02 DIAGNOSIS — I5030 Unspecified diastolic (congestive) heart failure: Secondary | ICD-10-CM | POA: Diagnosis not present

## 2021-09-02 DIAGNOSIS — J9610 Chronic respiratory failure, unspecified whether with hypoxia or hypercapnia: Secondary | ICD-10-CM | POA: Diagnosis not present

## 2021-09-02 DIAGNOSIS — D509 Iron deficiency anemia, unspecified: Secondary | ICD-10-CM

## 2021-09-02 DIAGNOSIS — Z430 Encounter for attention to tracheostomy: Secondary | ICD-10-CM | POA: Insufficient documentation

## 2021-09-02 DIAGNOSIS — E1122 Type 2 diabetes mellitus with diabetic chronic kidney disease: Secondary | ICD-10-CM | POA: Insufficient documentation

## 2021-09-02 DIAGNOSIS — Z9911 Dependence on respirator [ventilator] status: Secondary | ICD-10-CM | POA: Insufficient documentation

## 2021-09-02 DIAGNOSIS — Z930 Tracheostomy status: Secondary | ICD-10-CM | POA: Diagnosis not present

## 2021-09-02 DIAGNOSIS — K580 Irritable bowel syndrome with diarrhea: Secondary | ICD-10-CM | POA: Diagnosis not present

## 2021-09-02 DIAGNOSIS — R932 Abnormal findings on diagnostic imaging of liver and biliary tract: Secondary | ICD-10-CM | POA: Diagnosis not present

## 2021-09-02 MED ORDER — DICYCLOMINE HCL 10 MG PO CAPS: 10.0000 mg | ORAL_CAPSULE | Freq: Three times a day (TID) | ORAL | 3 refills | Status: DC

## 2021-09-02 NOTE — Telephone Encounter (Signed)
Pt consented to a virtual visit. 

## 2021-09-02 NOTE — Telephone Encounter (Signed)
[name], you are scheduled for a virtual visit with your provider today.  Just as we do with appointments in the office, we must obtain your consent to participate.  Your consent will be active for this visit and any virtual visit you may have with one of our providers in the next 365 days.  If you have a MyChart account, I can also send a copy of this consent to you electronically.  All virtual visits are billed to your insurance company just like a traditional visit in the office.  As this is a virtual visit, video technology does not allow for your provider to perform a traditional examination.  This may limit your provider's ability to fully assess your condition.  If your provider identifies any concerns that need to be evaluated in person or the need to arrange testing such as labs, EKG, etc, we will make arrangements to do so.  Although advances in technology are sophisticated, we cannot ensure that it will always work on either your end or our end.  If the connection with a video visit is poor, we may have to switch to a telephone visit.  With either a video or telephone visit, we are not always able to ensure that we have a secure connection.   I need to obtain your verbal consent now.   Are you willing to proceed with your visit today?  

## 2021-09-02 NOTE — Patient Instructions (Addendum)
We will arrange for you to have an ultrasound of your liver in the near future. ? ?Try decreasing Bentyl to 10 mg 3 times daily before meals and at bedtime as needed.  As we discussed, I would like to decrease this medication is much as we can due to concerning side effects as we get older. ?-Monitor for trouble urinating, blurry vision, dry eyes, dry mouth, dizziness, constipation. ? ?If you continue to do well 2 to 3 weeks after decreasing the dose of Bentyl, try eliminating 1 dose every 2 weeks and see how you do.  ? ?Use Imodium as needed for occasional diarrhea.  I would prefer that we ultimately try to transition to using imodium in place of Bentyl. You may take 2 Imodium after the first diarrhea episode and then additional 1 Imodium after each additional loose stool with a max of 4 Imodium daily. ? ?Continue Protonix twice a day as prescribed by your primary care doctor. ? ?Call hematology/oncology to schedule follow-up of iron deficiency anemia. ? ?Is was a pleasure meeting you today!   ? ?We will follow-up with you in 6 months.  Please call sooner if needed. ? ?Aliene Altes, PA-C ?[facility] ? ? ? ? ? ? ?

## 2021-09-02 NOTE — Telephone Encounter (Signed)
Called pt, left detailed VM with appt details for abd [place]. ?

## 2021-09-02 NOTE — Progress Notes (Signed)
Reason  for visit  ?Planned trach change ? ?HPI ?71 year old female patient with history of obstructive sleep apnea as well as obesity hypoventilation syndrome with need for nocturnal ventilation.  She lives at home, and her primary care provider is her daughter.  I last saw her on 1/23 at which time she was status post hospitalization after her tracheostomy had been dislodged necessitating tracheostomy revision by ENT.  She presents today for planned tracheostomy change. ?Since last visit: ? ? ?Review of Systems  ?Constitutional:  Negative for chills and fever.  ?HENT:  Positive for congestion, ear pain and sore throat.   ?     Recent nasal congestion w/ seasonal allergy exposure. More PND. Voice quality worse.   ?Respiratory:  Positive for cough and shortness of breath.   ?     Has baseline exertional dyspnea  ?No worse than baseline   ?Cardiovascular: Negative.   ?Gastrointestinal: Negative.   ?Genitourinary: Negative.   ?Musculoskeletal: Negative.   ?Skin:   ?     Some redness at bottom portion of trach stoma   ?Neurological: Negative.   ?Endo/Heme/Allergies: Negative.   ? ? ?Physical exam ?General this is a 71 year old female who is resting in wheel chair. She is in no distress ?HENT NCAT 6 cuffed trach in place. Voice quality a little worse than baseline. Some yellow secretions from trach. Does have increased granulation at site of trach stoma ?Pulm some scattered rhonchi. No accessory use  ?Card rrr ?Abd soft  ?Ext warm and dry  ?Neuro at baseline  ? ? ?Trach change: she was pre-medicated w/ inhaled lidocaine ?The size 6 was removed. Stoma was inspected and as noted in physical exam. At that point the new size 6 trach was replaced. Placement was verified using ETCO2. Pt tolerated well ? ? ?Impression/plan ?Chronic respiratory failure ?Obesity hypoventilation syndrome ?Obstructive sleep apnea ?Tracheostomy status, has size 6 cuffed [place] ?History of atrial fibrillation ?History of CKD stage II ?History of  heart failure PEF ?Type 2 diabetes ? ?Discussion ?In regards to her tracheostomy patient is doing ? ? ?Plan ?Return office 4 weeks for planned trach change ?Continue routine trach care ?Continue nocturnal ventilation ? ?My time 22 min ? ?Erick Colace ACNP-BC ?[place] ?Pager # [phone] OR # [phone] if no answer ? ?

## 2021-09-02 NOTE — Progress Notes (Signed)
Tracheostomy Procedure Note ? ?[name] ?[id number] ?11/24/1950 ? ?Pre Procedure Tracheostomy Information ?Lidocaine neb tx prior to trach change ?Trach BrandVassie Loll ?Size:  6.0   [id number] ?Style: Cuffed ?Secured by: Velcro ? ? ?Procedure:Trach cleaning and trach change ? ? ? ?Post Procedure Tracheostomy Information ? ?Trach BrandVassie Loll ?Size:  6.0   [id number] ?Style: Cuffed ?Secured by: Velcro ? ? ?Post Procedure Evaluation: ? ?ETCO2 positive color change from yellow to purple : Yes.   ?Vital signs:VSS ?Patients current condition: stable ?Complications: No apparent complications ?Trach site exam: clean, dry ?Wound care done: 4 x 4 gauze ?Patient did tolerate procedure well. ? ? ?Education: ?none ? ?Prescription needs: ?none ? ? ? ?Additional needs: ?none ? ? ? ? ? ? ?  ?

## 2021-09-08 ENCOUNTER — Ambulatory Visit (HOSPITAL_COMMUNITY): Admission: RE | Admit: 2021-09-08 | Payer: HMO | Source: Ambulatory Visit

## 2021-09-09 ENCOUNTER — Telehealth: Payer: Self-pay | Admitting: Cardiology

## 2021-09-09 NOTE — Telephone Encounter (Signed)
?  Patient Consent for Virtual Visit  ? ? ?   ? ?[name] has provided verbal consent on 09/09/2021 for a virtual visit (video or telephone). ? ? ?CONSENT FOR VIRTUAL VISIT FOR:  [name]  ?By participating in this virtual visit I agree to the following: ? ?I hereby voluntarily request, consent and authorize [facility] and its employed or contracted physicians, physician assistants, nurse practitioners or other licensed health care professionals (the Practitioner), to provide me with telemedicine health care services (the ?Services") as deemed necessary by the treating Practitioner. I acknowledge and consent to receive the Services by the Practitioner via telemedicine. I understand that the telemedicine visit will involve communicating with the Practitioner through live audiovisual communication technology and the disclosure of certain medical information by electronic transmission. I acknowledge that I have been given the opportunity to request an in-person assessment or other available alternative prior to the telemedicine visit and am voluntarily participating in the telemedicine visit. ? ?I understand that I have the right to withhold or withdraw my consent to the use of telemedicine in the course of my care at any time, without affecting my right to future care or treatment, and that the Practitioner or I may terminate the telemedicine visit at any time. I understand that I have the right to inspect all information obtained and/or recorded in the course of the telemedicine visit and may receive copies of available information for a reasonable fee.  I understand that some of the potential risks of receiving the Services via telemedicine include:  ?Delay or interruption in medical evaluation due to technological equipment failure or disruption; ?Information transmitted may not be sufficient (e.g. poor resolution of images) to allow for appropriate medical decision making by the Practitioner; and/or   ?In rare instances, security protocols could fail, causing a breach of personal health information. ? ?Furthermore, I acknowledge that it is my responsibility to provide information about my medical history, conditions and care that is complete and accurate to the best of my ability. I acknowledge that Practitioner's advice, recommendations, and/or decision may be based on factors not within their control, such as incomplete or inaccurate data provided by me or distortions of diagnostic images or specimens that may result from electronic transmissions. I understand that the practice of medicine is not an exact science and that Practitioner makes no warranties or guarantees regarding treatment outcomes. I acknowledge that a copy of this consent can be made available to me via my patient portal [facility] MyChart), or I can request a printed copy by calling the office of [facility].   ? ?I understand that my insurance will be billed for this visit.  ? ?I have read or had this consent read to me. ?I understand the contents of this consent, which adequately explains the benefits and risks of the Services being provided via telemedicine.  ?I have been provided ample opportunity to ask questions regarding this consent and the Services and have had my questions answered to my satisfaction. ?I give my informed consent for the services to be provided through the use of telemedicine in my medical care ? ? ? ?

## 2021-09-10 ENCOUNTER — Other Ambulatory Visit: Payer: Self-pay

## 2021-09-10 ENCOUNTER — Ambulatory Visit (INDEPENDENT_AMBULATORY_CARE_PROVIDER_SITE_OTHER): Payer: HMO | Admitting: Cardiology

## 2021-09-10 ENCOUNTER — Encounter: Payer: Self-pay | Admitting: Cardiology

## 2021-09-10 VITALS — BP 118/64 | HR 54 | Ht 66.0 in | Wt 184.0 lb

## 2021-09-10 DIAGNOSIS — D6869 Other thrombophilia: Secondary | ICD-10-CM | POA: Diagnosis not present

## 2021-09-10 DIAGNOSIS — I5032 Chronic diastolic (congestive) heart failure: Secondary | ICD-10-CM | POA: Diagnosis not present

## 2021-09-10 DIAGNOSIS — I4891 Unspecified atrial fibrillation: Secondary | ICD-10-CM | POA: Diagnosis not present

## 2021-09-10 NOTE — Patient Instructions (Signed)
Follow-Up: °Follow up with Dr. Branch in 6 months ° °Any Other Special Instructions Will Be Listed Below (If Applicable). ° ° ° ° °If you need a refill on your cardiac medications before your next appointment, please call your pharmacy. ° °

## 2021-09-10 NOTE — Progress Notes (Signed)
Virtual Visit via Telephone Note   This visit type was conducted due to national recommendations for restrictions regarding the COVID-19 Pandemic (e.g. social distancing) in an effort to limit this patient's exposure and mitigate transmission in our community.  Due to her co-morbid illnesses, this patient is at least at moderate risk for complications without adequate follow up.  This format is felt to be most appropriate for this patient at this time.  The patient did not have access to video technology/had technical difficulties with video requiring transitioning to audio format only (telephone).  All issues noted in this document were discussed and addressed.  No physical exam could be performed with this format.  Please refer to the patient's chart for her  consent to telehealth for [facility].    Date:  09/10/2021   ID:  [name], [name] 11/24/1950, MRN [id number] The patient was identified using 2 identifiers.  Patient Location: Home Provider Location: Office/Clinic   PCP:  Gareth Morgan, MD   [facility] Providers Cardiologist:  Dina Rich, MD     Evaluation Performed:  Follow-Up Visit  Chief Complaint:  Follow up visit  History of Present Illness:    [name] is a 71 y.o. female seen today for follow up of the following medical problems.   Patient with chronic tracheostomy, history is provided by her daughter   1. Chronic systolic diastolic CHF with mixed precap and post postcap Pulmonary Hypertension - echo 12/2013 PASP 85, moderate TR, RV mild to moderately dilated with normal function, could not eval diasotlic function due to afib, LVEF 50-55%. Biatrial enlargement.   - RHC 01/2015 PA 38/13 and calculated mean of 21, could not get a wedge however LVEDP 15. - repeat echo 10/2014 PASP 61 mmHg (down from 85 on prior echo), some evidence of RV dysfunction with RV TAPSE 1.4 and tissue anular systolic velocity of 7.5. Cannot evaluate diastolic function,  however severe biatrial enlarement suggests significant dysfunction, - 01/2015 RHC mean PA 52, PCWP 31 - 01/2015 echo LVEF 55-60%, PASP 46   - after most recent RHC in 01/2015 she was admitted for diuresis. Discharge weight 170 lbs, reportedly down from 190 on admission. Marland Kitchen Discharged on lasix  bid.          - was changed from torsemide to lasix furing 09/2020 admission - no recent SOB/DOE -  denies any recent edema. Home weights stable 217 lbs which is her baseline. - in Feb very labile LV function likely transient stress induced CM   08/17/2020 echo [facility] LVEF 15-20% 08/20/2020 30 [facility]% 08/29/2020 [facility] LVEF 50-55%   - wt stable 183 lbs and stable. No recent edema. Lasix  once daily. Most recent Cr and K have WNL.     2. Pneumonia - admission 09/2020 with pneumonia. Severe mucous plugging with left mainstem occlusion requiring intubation, bronch - admission comlicated by AMS,        3. Afib  - amio started 02/2015.  - has had severe diffusion dfect on PFTs 04/2015 prior   - no recent palpitations.   - 02/2021 normal LFTs, TSH 01/2021 was normal     4. OSA - could not tolerate CPAP      5/Chronic resp failure - has trach, O2 collar during the day and vent at night    The patient does not have symptoms concerning for COVID-19 infection (fever, chills, cough, or new shortness of breath).    Past Medical History:  Diagnosis Date  Abnormal pulmonary function test    Anemia    H/H of 10/30 with a normal MCV in 12/09   Anxiety    Arthritis    Barrett's esophagus    Diagnosed 1995. Last EGD 2016-NO BARRETT'S.    Chest pain    a. 2022 Cath: nl cors; b. 2008 Neg myoview; c. 01/2014 Cath: Nl cors. EF 40%; c. 08/2020 NSTEMI/Cath [facility]): nl cors.   Chronic anticoagulation    Chronic combined systolic and diastolic CHF (congestive heart failure) (HCC)    a. 10/2014 Echo: EF40%; b. 01/2015 Echo: EF 55-60%; c. 11/2020 Echo: EF 55-60%, mod LVH, sev BAE, mild MR, mod TR. PASP  ; d. 01/2021 Echo: EF 45-50%, glob HK, mod conc LVH, mod reduced RV fxn, RVSP 60.11mmHg, Mod TR, mild to mod MR, sev dil LA.   Chronic LBP    Surgical intervention in 1996   Diabetes mellitus, type 2 (HCC)    Insulin therapy; exacerbated by prednisone   Gastroparesis    99% retention 05/2008 on GES   GERD (gastroesophageal reflux disease)    Heart attack (HCC) 08/13/2020   Hiatal hernia    Hyperlipidemia    Hypertension    Hypothyroid    IBS (irritable bowel syndrome)    Obesity    OSA on CPAP    had CPAP and cannot tolerate.   Paroxysmal atrial fibrillation (HCC)    a.CHA2DS2VASc = 6-->eliquis. Also on Amio.   Pulmonary hypertension (HCC) 01/2015   a. 01/2015 RHC: Predominantly pulmonary venous hypertension but may be component of PAH (PA mean 52, PCWP 31); b. 01/2021 Echo: RVSP 60.81mmHg w/ mod reduced RV fxn.   Seizures (HCC)    last seizure was 2 years ago; on keppra for this; unknown etiology   Syncope    a. Admitted 05/2009; magnetic resonance imagin/ MRA - negative; etiology thought to be orthostasis secondary to drugs and dehydration. b. Syncope 02/2015 also felt 2/2 dehydration.   Past Surgical History:  Procedure Laterality Date   BACK SURGERY  1996   BIOPSY N/A 11/08/2013   Procedure: BIOPSY  / Tissue sampling / ulcers present in small intestine;  Surgeon: West Bali, MD;  Location: [facility] ENDO SUITE;  Service: Endoscopy;  Laterality: N/A;   CARDIAC CATHETERIZATION  2002   CARDIAC CATHETERIZATION N/A 01/26/2015   Procedure: Right Heart Cath;  Surgeon: Laurey Morale, MD;  Location: [facility] INVASIVE CV LAB;  Service: Cardiovascular;  Laterality: N/A;   CARDIOVASCULAR STRESS TEST  2008   Stress nuclear study   CARDIOVERSION N/A 03/06/2015   Procedure: CARDIOVERSION;  Surgeon: Laurey Morale, MD;  Location: [facility] ENDOSCOPY;  Service: Cardiovascular;  Laterality: N/A;   CARPAL TUNNEL RELEASE  1994   COLONOSCOPY  11/2011   Dr. Darrick Penna: Internal hemorrhoids, mild diverticulosis.  Random colon biopsies negative.   COLONOSCOPY N/A 11/08/2013   SLF: Normal mucosa in the terminal ileum/The colon IS redundant/  Moderate diverticulosis throughout the entire colon. ileum bx benign. colon bx benign   CRYOTHERAPY  01/23/2021   Procedure: CRYOTHERAPY;  Surgeon: Lorin Glass, MD;  Location: [facility] ENDOSCOPY;  Service: Pulmonary;;   CRYOTHERAPY  01/24/2021   Procedure: Cloyde Reams;  Surgeon: Lorin Glass, MD;  Location: [facility] ENDOSCOPY;  Service: Pulmonary;;   CRYOTHERAPY  01/26/2021   Procedure: Cloyde Reams;  Surgeon: Lupita Leash, MD;  Location: [facility] ENDOSCOPY;  Service: Cardiopulmonary;;   ESOPHAGOGASTRODUODENOSCOPY  2008   Barrett's without dysplasia. esphagus dilated. antral erosions, h.pylori serologies negative.   ESOPHAGOGASTRODUODENOSCOPY  11/2011   Dr. Darrick Penna: Barrett's esophagus, mild gastritis, diverticulum in the second portion of the duodenum repeat EGD 3 years. Small bowel biopsies negative. Gastric biopsy show reactive gastropathy but no H. pylori. Esophageal biopsies consistent with GERD. Next EGD 11/2014   ESOPHAGOGASTRODUODENOSCOPY N/A 11/21/2014   HAF:BXUX non-erosive gastritis/irregular z-line   FOREIGN BODY REMOVAL  01/23/2021   Procedure: FOREIGN BODY REMOVAL;  Surgeon: Lorin Glass, MD;  Location: [facility] ENDOSCOPY;  Service: Pulmonary;;   FOREIGN BODY REMOVAL  01/24/2021   Procedure: FOREIGN BODY REMOVAL;  Surgeon: Lorin Glass, MD;  Location: [facility] ENDOSCOPY;  Service: Pulmonary;;   GIVENS CAPSULE STUDY  12/07/2011   Proximal small bowel, rare AVM. Distal small bowel, multiple ulcers noted   GIVENS CAPSULE STUDY N/A 09/27/2013   Distal small bowel ulcers extending to TI.   GIVENS CAPSULE STUDY N/A 10/10/2013   Procedure: GIVENS CAPSULE STUDY;  Surgeon: West Bali, MD;  Location: [facility] ENDO SUITE;  Service: Endoscopy;  Laterality: N/A;  7:30   HEMOSTASIS CONTROL  01/24/2021   Procedure: HEMOSTASIS CONTROL;  Surgeon: Lorin Glass, MD;  Location: [facility] ENDOSCOPY;   Service: Pulmonary;;   HEMOSTASIS CONTROL  01/26/2021   Procedure: HEMOSTASIS CONTROL;  Surgeon: Lupita Leash, MD;  Location: [facility] ENDOSCOPY;  Service: Cardiopulmonary;;   IR GASTROSTOMY TUBE MOD SED  01/11/2021   IR GASTROSTOMY TUBE REMOVAL  04/13/2021   IR KYPHO THORACIC WITH BONE BIOPSY  02/09/2018   KNEE ARTHROSCOPY WITH MEDIAL MENISECTOMY Right 06/09/2016   Procedure: KNEE ARTHROSCOPY WITH MEDIAL MENISECTOMY;  Surgeon: Vickki Hearing, MD;  Location: [facility] ORS;  Service: Orthopedics;  Laterality: Right;  medial and lateral menisectomy   LAMINECTOMY  1995   L4-L5   LAPAROSCOPIC CHOLECYSTECTOMY  1990s   LEFT HEART CATHETERIZATION WITH CORONARY ANGIOGRAM  01/10/2014   Procedure: LEFT HEART CATHETERIZATION WITH CORONARY ANGIOGRAM;  Surgeon: Lesleigh Noe, MD;  Location: [facility] CATH LAB;  Service: Cardiovascular;;   RIGHT HEART CATHETERIZATION N/A 01/10/2014   Procedure: RIGHT HEART CATH;  Surgeon: Lesleigh Noe, MD;  Location: [facility] CATH LAB;  Service: Cardiovascular;  Laterality: N/A;   TOTAL ABDOMINAL HYSTERECTOMY  1999   TRACHEOSTOMY REVISION N/A 07/18/2021   Procedure: TRACHEOSTOMY REVISION;  Surgeon: Suzanna Obey, MD;  Location: [facility] OR;  Service: ENT;  Laterality: N/A;   TRACHEOSTOMY TUBE PLACEMENT N/A 09/01/2017   Procedure: TRACHEOSTOMY;  Surgeon: Drema Halon, MD;  Location[facility] OR;  Service: ENT;  Laterality: N/A;   VIDEO BRONCHOSCOPY Bilateral 01/23/2021   Procedure: VIDEO BRONCHOSCOPY WITHOUT FLUORO;  Surgeon: Lorin Glass, MD;  Location: [facility] ENDOSCOPY;  Service: Pulmonary;  Laterality: Bilateral;   VIDEO BRONCHOSCOPY Bilateral 01/24/2021   Procedure: VIDEO BRONCHOSCOPY WITHOUT FLUORO;  Surgeon: Lorin Glass, MD;  Location: [facility] ENDOSCOPY;  Service: Pulmonary;  Laterality: Bilateral;   VIDEO BRONCHOSCOPY N/A 01/26/2021   Procedure: VIDEO BRONCHOSCOPY WITHOUT FLUORO;  Surgeon: Lupita Leash, MD;  Location: [facility] ENDOSCOPY;  Service: Cardiopulmonary;  Laterality: N/A;     No  outpatient medications have been marked as taking for the 09/10/21 encounter (Appointment) with Antoine Poche, MD.     Allergies:   Citalopram hydrobromide, Codeine, Hydromorphone hcl, Metoclopramide, and Hyoscyamine   Social History   Tobacco Use   Smoking status: Former    Packs/day: 0.25    Years: 15.00    Pack years: 3.75    Types: Cigarettes    Start date: 02/26/1966    Quit date: 07/01/1983  Years since quitting: 38.2   Smokeless tobacco: Never   Tobacco comments:    quit in 1984  Vaping Use   Vaping Use: Never used  Substance Use Topics   Alcohol use: Not Currently    Comment: Occasional alcohol 30 years ago (09/02/21)   Drug use: No     Family Hx: The patient's family history includes Alzheimer's disease in her mother; Breast cancer in her sister; Heart attack in her mother; Hypertension in her mother and another family member; Stroke in her mother. There is no history of Heart disease, Colon cancer, or Liver disease.  ROS:   Please see the history of present illness.     All other systems reviewed and are negative.   Prior CV studies:   The following studies were reviewed today:  08/2020 cath Findings:  1. No significant coronary artery disease.  2. Elevated right and left ventricular filling pressures (LVEDP = 27 mm  Hg).  3. Severe pulmonary hypertension      08/2020 echo Summary    1. Echo contrast utilized to enhance endocardial border definition.    2. The left ventricle is normal in size with mildly increased wall  thickness.    3. The left ventricular systolic function is severely decreased, LVEF is  visually estimated at 15-20%.    4. The left atrium is moderately dilated in size.    5. There is moderate tricuspid regurgitation.    6. There is severe pulmonary hypertension, estimated pulmonary artery  systolic pressure is 90 mmHg.    7. The right atrium is mildly to moderately dilated  in size.    8. IVC size and inspiratory change suggest  mildly elevated right atrial  pressure. (5-10 mmHg).      08/2020 Summary    1. The left ventricle is upper normal in size with mildly increased wall  thickness.    2. The left ventricular systolic function is severely decreased, LVEF is  visually estimated at 30%.    3. There is severe hypokinesis of the apical left ventricular segments with  normal contractile function of the basilar segments, consistent with a stress  cardiomyopathy.    4. Mitral annular calcification is present (severe).    5. The mitral valve leaflets are mildly thickened with normal leaflet  mobility.    6. There is mild mitral valve regurgitation.    7. The aortic valve is probably trileaflet with mildly thickened leaflets  with normal excursion.    8. The left atrium is moderately dilated in size.    9. The right ventricle is upper normal in size, with normal systolic  function.    10. The right atrium is mildly dilated  in size.   Labs/Other Tests and Data Reviewed:    EKG:  No ECG reviewed.  Recent Labs: 01/03/2021: B Natriuretic Peptide 614.0 01/04/2021: TSH 0.697 02/01/2021: ALT 14 07/18/2021: Magnesium 2.0 07/19/2021: BUN 23; Creatinine, Ser 1.06; Hemoglobin 9.0; Platelets 148; Potassium 3.9; Sodium 142   Recent Lipid Panel Lab Results  Component Value Date/Time   CHOL 149 08/29/2020 04:16 AM   TRIG 105 01/27/2021 03:52 AM   HDL 38 (L) 08/29/2020 04:16 AM   CHOLHDL 3.9 08/29/2020 04:16 AM   LDLCALC 88 08/29/2020 04:16 AM    Wt Readings from Last 3 Encounters:  09/02/21 187 lb (84.8 kg)  07/19/21 188 lb 4.4 oz (85.4 kg)  05/24/21 195 lb 3.2 oz (88.5 kg)     Risk Assessment/Calculations:  Objective:    Vital Signs:   Today's Vitals   09/10/21 1312  BP: 118/64  Pulse: (!) 54  SpO2: 100%  Weight: 184 lb (83.5 kg)  Height: 5\' 6"  (1.676 m)   Body mass index is 29.7 kg/m. Unable to assess, history is obtained through patient's daughter.   ASSESSMENT & PLAN:    1. Chronic  diastolic heart failure -from 08/[facility] admissions to [facility] and [facility] transient severe LV dysfunction by [facility] echos, by her admission later in the month at [facility] LVEF had normalized - home weights are stable per family report, no recent edema. Continue lasix.    2. Afib/acquired thrombophilia - doing well without symptoms, continue current meds - continue eliquis for stroke prevention.          COVID-19 Education: The signs and symptoms of COVID-19 were discussed with the patient and how to seek care for testing (follow up with PCP or arrange E-visit).  The importance of social distancing was discussed today.  Time:   Today, I have spent 17 minutes with the patient with telehealth technology discussing the above problems.     Medication Adjustments/Labs and Tests Ordered: Current medicines are reviewed at length with the patient today.  Concerns regarding medicines are outlined above.   Tests Ordered: No orders of the defined types were placed in this encounter.   Medication Changes: No orders of the defined types were placed in this encounter.   Follow Up:  In Person 6 months  Signed, [facility], MD  09/10/2021 9:41 AM    [facility]

## 2021-09-24 ENCOUNTER — Encounter: Payer: Self-pay | Admitting: Internal Medicine

## 2021-09-24 ENCOUNTER — Ambulatory Visit (HOSPITAL_COMMUNITY)
Admission: RE | Admit: 2021-09-24 | Discharge: 2021-09-24 | Disposition: A | Discharge status: Home / Self Care | Payer: HMO | Source: Ambulatory Visit | Attending: Internal Medicine | Admitting: Internal Medicine

## 2021-09-24 ENCOUNTER — Other Ambulatory Visit: Payer: Self-pay

## 2021-09-24 ENCOUNTER — Other Ambulatory Visit (HOSPITAL_COMMUNITY): Payer: Self-pay | Admitting: Nurse Practitioner

## 2021-09-24 ENCOUNTER — Ambulatory Visit: Payer: HMO | Admitting: Internal Medicine

## 2021-09-24 ENCOUNTER — Ambulatory Visit (HOSPITAL_COMMUNITY)
Admission: RE | Admit: 2021-09-24 | Discharge: 2021-09-24 | Disposition: A | Discharge status: Home / Self Care | Payer: HMO | Source: Ambulatory Visit | Attending: Nurse Practitioner | Admitting: Nurse Practitioner

## 2021-09-24 DIAGNOSIS — J9611 Chronic respiratory failure with hypoxia: Secondary | ICD-10-CM | POA: Diagnosis not present

## 2021-09-24 DIAGNOSIS — M25561 Pain in right knee: Secondary | ICD-10-CM | POA: Insufficient documentation

## 2021-09-24 DIAGNOSIS — J9612 Chronic respiratory failure with hypercapnia: Secondary | ICD-10-CM

## 2021-09-24 DIAGNOSIS — R0609 Other forms of dyspnea: Secondary | ICD-10-CM

## 2021-09-24 NOTE — Patient Instructions (Signed)
Call if mucus getting nasty or bloody or any problems swallowing or getting food from trach on suctioning  ? ?Please remember to go to the  x-ray department  @  [facility] for your tests - we will call you with the results when they are available    ? ?Pulmonary follow up can be as needed if doing well.  ?

## 2021-09-24 NOTE — Progress Notes (Signed)
? ?Subjective:  ? ? Patient ID: [name], female    DOB: 02-09-1951,   MRN: [id number] ? ?  ?Brief patient profile:  ?70  yowf quit smoking 1984 no symptoms referred by Dr Aundra Dubin to pulmonary clinic 04/21/2015 with abn pfts p starting amiodarone  01/29/2015 for CAF  ? ? ? ?History of Present Illness  ?04/21/2015 1st [place] [facility] office visit/ [name]   ?Chief Complaint  ?Patient presents with  ? Pulmonary Consult  ?  Referred by Dr. Loralie Champagne for eval of abnormal PFT's . Pt had PFT done to est baseline before starting amiodarone. She c/o SOB and cough for the past 3 months. She states that she is SOB somtimes just sitting and walking short distances. Her cough is prod with clear sputum, mainly in the am.   ?able to [employer] pushing cart slow pace s sob  ?Cough x 2 years    ?Carries dx of gastroparesis on marinol and gaining wt  ?Cough is worst first thing in am x years  ?rec ?Add pepcid 20 mg at bedtime  ?GERD diet  ? ? ? ?07/22/2015  f/u ov/[name] re:  ? Amiodarone tox  ?Chief Complaint  ?Patient presents with  ? Follow-up  ?  PFT done today. Pt states her breathing has improved. She still c/o occ cough with clear sputum.   ?cough much better in am since adding pepcid at hs/ limited by knee with activity/ not sob  ?rec ?Walk as much as your knee will allow ?Return for increase cough or difficulty with breathing with exertion  ?Please schedule a follow up visit in 6  months but call sooner if needed with pfts on return - cancel if you stop amiodarone in the meantime ? ? ?01/22/2016  f/u ov/[name] re: doe on amiodarone  ?Chief Complaint  ?Patient presents with  ? Follow-up  ?  PFT done today. Breathing is doing well. No new co's today.   ?limited more by back than knees  ?rec ?Please schedule a follow up visit in 6 months but call sooner if needed with cxr / pfts on return unless you stop the amiodarone in the meantime > did not do  ? ? ? ?Admit date: 11/25/2017 ?Discharge date: 11/29/2017 ? ? ?1. Acute on chronic  diastolic CHF: presented with weight gain and LE edema. Diuresed with IV lasix with UOP net -1.4L this admission. Weight 237>229lbs on the day of discharge.  Cr stable - 1.08 on the day of discharge.  ?- Discharged home on demadex 40mg  in AM and 20mg  in PM.  ?  ?2. Secondary pulmonary HTN: echo this admission with PA pressure 81mmHg, improved from previous.  ?- Recommend continued management of sleep apnea ?  ?3. Paroxysmal atrial fibrillation: maintained sinus rhythm this admission ?- Continued amiodarone and eliquis.  ?  ?4. DM type 2: maintained on ISS inpatient ?- Continued home medications at discharge ? ? ? ? ? ? ? ?12/08/2017  Extended f/u ov/[name] re: re-esatablish re doe/  still on amiodarone on 02 2lpm / none at rest and 2lpm outside the house ok  ?Chief Complaint  ?Patient presents with  ? Follow-up  ?  Per Dr Idolina Primer. Pt states had PNA in Feb 2019 and then had to be intubated and then ended up needing a trach.  She had trach removed in 09/10/17.  She has been having some swelling in her legs.  She has occ SOB with "alot of activity".   ? Dyspnea:  Some  grocery shopping on 2lpm  ?Cough: none ?Sleep: 2lpm hs ok  ?Leg swelling severity  correlates well with   Sob ?Rec ?Please schedule a follow up visit in 3 months but call sooner if needed with pfts on return  ? ?Admit date: 07/18/2021 ?Discharge date: 07/19/2021 ?  ?Admitted From: Home ?Discharge disposition: Home ?  Code Status: Full Code  ?  ?Discharge Diagnosis:  ?  ?Principal Problem: ?  Tracheostomy complication ([place]) ?Active Problems: ?  Hypothyroidism ?  Atrial fibrillation ([place]) ?  OSA (obstructive sleep apnea) ?  Seizure ([place]) ?  Dementia ([place]) ?  Uncontrolled type 2 diabetes mellitus with hyperglycemia ([place]) ?  Chronic respiratory failure with hypoxia and hypercapnia (HCC) ?  Depression ?  Dependence on home ventilator [facility]) ?  Chronic systolic CHF (congestive heart failure) ([place]) ?  Mild renal insufficiency ?  ?  ?  ?   ?Chief Complaint  ?Patient  presents with  ? Trach Issues  ?  ?Brief narrative: ?[name] is a 72 y.o. female with PMH significant for DM2, HTN, chronic systolic CHF, A. fib on Eliquis, OSA/OHS, seizures, dementia, anxiety/depression, chronic hypoxic respiratory failure on home oxygen, BiPAP intolerance, vent dependent at night ?Patient was brought to the ED late last night after which tracheostomy tube could not be reinserted on routine exchange.  Patient's daughter was performing nightly tracheostomy care, cannula fell out, she had some bleeding.  EMS was called and patient was brought to the ED on nasal cannula. ?  ?In the ED, patient was afebrile, oxygen saturation more than 90 on 4 L by nasal cannula.  Hemodynamically stable ?Labs with glucose 291, creatinine 1.24.  CBC unremarkable. ?  ?PCCM saw the patient in the ED.  Per their note, patient's trach stoma looked completely occluded, closed to sealed.  But she was not in respiratory distress.  It was deemed very risky to push the tracheostomy. ?  ?Patient was kept on observation under hospitalist service ?ENT consultation was obtained.  Patient underwent a new tracheostomy tube placement by Dr. Janace Hoard on 1/15.  Monitored in ICU overnight.  No complication.  Stable for discharge today. ?  ?Subjective: ?Patient was seen and examined this morning.  Pleasant elderly Caucasian female.  Lying down on bed.  On vent through new tracheostomy tube.   ?Daughter at bedside. ?  ?Assessment/Plan: ?Tracheostomy complication ?-Patient had a tracheostomy tube and uses ventilator at night ?-Brought into the hospital after tube dislodged. ?-Underwent new tracheostomy tube placement by ENT Dr. Janace Hoard. ?-Per note, #6 cuffed Shiley was placed. She previously had a size 8 cuffed.  ?-PCCM consult appreciated as well. ?-Continue trach collar during waking hours and vent with sleep.  ?-No changes in home care regimen ?-home with at least one spare trach and inner cannulas and a new speaking valve. ?-She has an  appointment with the trach clinic on 1/23 at which point she can have her trach sutures removed.  ?  ?OSA ?Chronic hypoxic and hypercarbic respiratory failure ?BiPAP intolerance ?-On nasal cannula during the day and was on ventilator through trach at night.   ?-Unable to continue ventilator at this time because of stoma closed ?  ?Atrial fibrillation  ?-Continue amiodarone, metoprolol, Eliquis  ?  ?AKI on CKD2 ?-Serum creatinine 0.93 from August.  Was 1.24 on admission.  Improved to normal.  Lasix can be resumed post discharge. ?Recent Labs (within last 365 days)  ?            ?Recent  Labs  ?  02/08/21 ?0434 02/09/21 ?1246 02/10/21 ?0156 02/11/21 ?0301 02/12/21 ?0459 02/13/21 ?0149 02/14/21 ?0141 07/18/21 ?[id number] 07/18/21 ?0503 07/19/21 ?0417  ?BUN 29* 25* 27* 25* 24* 26* 26* 23 22 23   ?CREATININE 0.81 0.92 0.82 0.79 0.78 0.85 0.93 1.24* 1.09* 1.06*  ?  ?  ?HFpEF   ?-EF was 45-50% in July 2022  ?-Appears compensated  ?-continue beta-blocker,, Lasix. ?  ?Type 2 diabetes mellitus with hyperglycemia ?  ?  ?Seizure disorder  ?-Continue Keppra  ? ? ?09/24/2021  Re-establish:  ov/[place] office/[name] re: trach dep/ chronic resp failure maint on noct vent and 24 h 02   ?Chief Complaint  ?Patient presents with  ? Consult  ?  Self referral after hosp stay at [facility] in June   ?Dyspnea:  walking around own house with 02  per trach collar  ?Cough: can bring mucus with pmv but otherwise req suction sev times a day  ?Sleeping: maybe 30 degrees hosp bed on vent per [employer]  ?SABA use: does neb 1st thing in am and sometimes in pm ?02:   4 lpm thru vent  - runs low with activity @  2lpm  ?Covid status: vax x 4  ?  ? ? ?No obvious day to day or daytime variability or assoc excess/ purulent sputum or mucus plugs or hemoptysis or cp or chest tightness, subjective wheeze or overt sinus or hb symptoms.  ? ?Sleeping on vent  without nocturnal  or early am exacerbation  of respiratory  c/o's or need for noct saba. Also denies any obvious  fluctuation of symptoms with weather or environmental changes or other aggravating or alleviating factors except as outlined above  ? ?No unusual exposure hx or h/o childhood pna/ asthma or knowledge of premature bir

## 2021-09-25 ENCOUNTER — Encounter: Payer: Self-pay | Admitting: Internal Medicine

## 2021-09-25 NOTE — Assessment & Plan Note (Signed)
Placed on amiodarone 01/29/15  ?04/10/15  FEV1  1.49 (60%) ratio 88 and dlco 53 corrects to 89% at amiodarone 400 ?04/21/2015  Walked RA x 2 laps @ 185 ft each stopped due to  Dizzy and legs gave out, nl pace, no desat  ?- PFT's  07/22/2015  FEV1 1.76 (71 % ) ratio 86  p 2 % improvement from saba with  VC 2.02(62%) DLCO  56 % corrects to 93 % for alv volume  ?- PFT's  01/22/2016  FEV1 1.63 (66 % ) ratio 90  p 16 % improvement from saba p nothing prior to study with VC 1.84(57%)  DLCO  54/53 % corrects to 89 % for alv volume   ?- 12/08/2017   Walked RA  2 laps @ 185 ft each stopped due to  Sob/ desats transiently (see chronic resp failure)  ? ?Clearly has deteriorated since last pulmonary evaluation now with noct vent dependence and severe debilitation but nothing to add to her care at this point  ? ?rec baseline cxr and f/u thru the trach clinic  ? ?If deteriorates further would consider palliative care/ hospice. ? ?    ?  ? ?Each maintenance medication was reviewed in detail including emphasizing most importantly the difference between maintenance and prns and under what circumstances the prns are to be triggered using an action plan format where appropriate. ? ?Total time for H and P, chart review, counseling, reviewing 02 device(s) and generating customized AVS unique to this office visit / same day charting = > 30 min in pt not seen in this clinic in over 3 y ?     ?

## 2021-09-25 NOTE — Assessment & Plan Note (Signed)
HCO3   11/29/17 =  32  ?HCO3   11/29/17 =  32  ?PFT's  6//10/2017  FEV1 1.35 (56 % ) ratio 0.87  p 3 % improvement from saba p 0 prior to study with DLCO  8.26 (32%) corrects to 3.22 (65%)  for alv volume and FV curve s obst and ERV 23%  at wt 231 ?- s/p Trach 01/07/21 and noct vent dep  ? ?Well compensated but severely debilitated and most of her pulmonary care is now thru the trach clinic with nothing to add at this point other than rec 02 titration daytime to keep sats in low 90s and f/u here can be q 3 m ?

## 2021-09-28 ENCOUNTER — Ambulatory Visit (HOSPITAL_COMMUNITY)
Admission: RE | Admit: 2021-09-28 | Discharge: 2021-09-28 | Disposition: A | Discharge status: Home / Self Care | Payer: HMO | Source: Ambulatory Visit | Attending: Acute Care | Admitting: Acute Care

## 2021-09-28 ENCOUNTER — Telehealth: Payer: Self-pay | Admitting: Acute Care

## 2021-09-28 DIAGNOSIS — I509 Heart failure, unspecified: Secondary | ICD-10-CM | POA: Insufficient documentation

## 2021-09-28 DIAGNOSIS — J9610 Chronic respiratory failure, unspecified whether with hypoxia or hypercapnia: Secondary | ICD-10-CM | POA: Insufficient documentation

## 2021-09-28 DIAGNOSIS — Z930 Tracheostomy status: Secondary | ICD-10-CM | POA: Diagnosis not present

## 2021-09-28 DIAGNOSIS — E1122 Type 2 diabetes mellitus with diabetic chronic kidney disease: Secondary | ICD-10-CM | POA: Diagnosis not present

## 2021-09-28 DIAGNOSIS — E662 Morbid (severe) obesity with alveolar hypoventilation: Secondary | ICD-10-CM | POA: Diagnosis present

## 2021-09-28 DIAGNOSIS — J386 Stenosis of larynx: Secondary | ICD-10-CM | POA: Insufficient documentation

## 2021-09-28 DIAGNOSIS — I4891 Unspecified atrial fibrillation: Secondary | ICD-10-CM | POA: Insufficient documentation

## 2021-09-28 DIAGNOSIS — N182 Chronic kidney disease, stage 2 (mild): Secondary | ICD-10-CM | POA: Insufficient documentation

## 2021-09-28 NOTE — Progress Notes (Signed)
Reason  for visit  ?Planned trach change ? ?HPI ?71 year old female patient with history of obstructive sleep apnea as well as obesity hypoventilation syndrome with need for nocturnal ventilation.  She lives at home, and her primary care provider is her daughter.  I last saw her on 1/23 at which time she was status post hospitalization after her tracheostomy had been dislodged necessitating tracheostomy revision by ENT.  She presents today for planned tracheostomy change. ?Since last visit: ?Doing well from a breathing standpoint, however over the last few days has been unable to tolerate PMV, or phonate. ? ? ?Review of Systems  ?HENT:  Negative for congestion, ear pain and sore throat.   ?     Loss of voice and sob w/ PMV  ?Eyes: Negative.   ?Respiratory:  Positive for cough and shortness of breath.   ?     Has baseline exertional dyspnea  ?No worse than baseline   ?Cardiovascular: Negative.   ?Gastrointestinal: Negative.   ?Genitourinary: Negative.   ?Musculoskeletal: Negative.   ?Skin:   ?     Some redness at bottom portion of trach stoma   ?Neurological: Negative.   ?Endo/Heme/Allergies: Negative.   ? ? ?Physical exam ?General: 71 year old female sitting up in wheelchair she is in no acute distress today however no longer able to phonate with PMV in place ?HEENT normocephalic atraumatic no jugular venous distention appreciated she has a size 6 tracheostomy in place, she is intolerant of PMV with fairly rapid and turbulent rush of air when PMV removed ?Pulmonary: Clear diminished bases ?Cardiac regular rate and rhythm ?Abdomen soft nontender  ? ? ?Trach change: The size 6 tracheostomy was removed, tracheostomy stoma inspected, unremarkable.  A new size 6 tracheostomy was placed without difficulty, placement verified by end-tidal CO2.  Unfortunately still unable to tolerate PMV. ? 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 ?Discussion ?In regards to her tracheostomy I am concerned about possible subglottic stenosis as evidenced by inability to speak and tolerate PMV at this point as well as large rush of air when PMV removed ? ?Plan ?Continue routine trach changes every 4 weeks given her tracheostomy is cuffed ?Continue nocturnal ventilation ?I have placed referral to ENT to evaluate for subglottic stenosis and also loss of phonation ? ?My time 21 min  ? ?Erick Colace ACNP-BC ?[place] ?Pager # [phone] OR # [phone] if no answer ? ?

## 2021-09-28 NOTE — Progress Notes (Signed)
Tracheostomy Procedure Note ? ?[name] ?[id number] ?Sep 19, 1950 ? ?Pre Procedure Tracheostomy Information ? ?Trach BrandVassie Loll ?Size:  6.0  6CN75R ?Style: Cuffed ?Secured by: Velcro ? ? ?Procedure: Trach change and Trach cleaning ? ? ?Lidocaine neb prior to trach change ?Post Procedure Tracheostomy Information ? ?Trach BrandVassie Loll ?Size:  6.0 6CN75R ?Style: Cuffed ?Secured by: Velcro ? ? ?Post Procedure Evaluation: ? ?ETCO2 positive color change from yellow to purple : Yes.   ?Vital signs:VSS ?Patients current condition: stable ?Complications: No apparent complications ?Trach site exam: clean, dry ?Wound care done: 4 x 4 gauze ?Patient did tolerate procedure well. ? ? ?Education ?none ? ?Prescription needs: ?none ? ? ? ?Additional needs: ?none ? ? ? ? ? ? ?  ?

## 2021-09-28 NOTE — Telephone Encounter (Signed)
Someone has to place an ENT referral before we can send one ?

## 2021-09-29 NOTE — Telephone Encounter (Signed)
Referral placed to ENT

## 2021-09-29 NOTE — Telephone Encounter (Signed)
We can see the order the way Cindee Lame put it in not in our work que ?

## 2021-09-29 NOTE — Telephone Encounter (Signed)
Routing to Anita to make aware.  

## 2021-10-19 ENCOUNTER — Encounter: Payer: Self-pay | Admitting: *Deleted

## 2021-11-01 ENCOUNTER — Telehealth: Payer: Self-pay | Admitting: Cardiology

## 2021-11-01 NOTE — Telephone Encounter (Signed)
Pharm noted, appreciated, await MD commentary as requested below. ?

## 2021-11-01 NOTE — Telephone Encounter (Signed)
? ?  Pre-operative Risk Assessment  ?  ?Patient Name: [name]  ?DOB: 11-08-1950 ?MRN: [id number]  ? ?  ? ?Request for Surgical Clearance   ? ?Procedure:  Bronchoscopy balloon dilation possible CO2 laser ? ?Date of Surgery:  Clearance 11/17/21                              ?   ?Surgeon:  Dr. Pollyann Kennedy  ?Surgeon's Group or Practice Name:  [facility]  ?Phone number:  [phone] ?Fax number:  [phone] ?  ?  ?Type of Clearance Requested:   ?- Medical  ?- Pharmacy:  Hold TBD  by Cardiology  ?  ?Type of Anesthesia:  General  ?  ?Additional requests/questions:   ? ?Signed, ?Brooks Sailors   ?11/01/2021, 11:18 AM   ?

## 2021-11-01 NOTE — Telephone Encounter (Signed)
Patient with diagnosis of afib on Eliquis for anticoagulation.   ? ?Procedure: Bronchoscopy balloon dilation possible CO2 laser ?Date of procedure: 11/17/21 ? ?CHA2DS2-VASc Score = 8  ?This indicates a 10.8% annual risk of stroke. ?The patient's score is based upon: ?CHF History: 1 ?HTN History: 1 ?Diabetes History: 1 ?Stroke History: 2 ?Vascular Disease History: 1 ?Age Score: 1 ?Gender Score: 1 ?  ?CrCl 20mL/min ?Platelet count 148K ? ?Per office protocol, patient can hold Eliquis for 1 day prior to procedure. She should resume as soon as safely possible after given her elevated CV risk. ?

## 2021-11-01 NOTE — Telephone Encounter (Signed)
? ?  Patient Name: [name]  ?DOB: 02-Jun-1951 ?MRN: [id number] ? ?Primary Cardiologist: Dina Rich, MD ? ?Chart reviewed as part of pre-operative protocol coverage.  ? ?Patient had recent visit 09/10/21 virtually. She has chronic respiratory failure with trache collar so virtual visit unlikely to be helpful for clearance. Also has chronic CHF (mid range EF) with pulmonary HTN requiring diuresis. Last echo 01/2021 EF 45-50%, global HK, severely elevated PASP, Mild to moderate mitral valve regurgitation, with blunting of systolic pulmonary vein flow. She was continued on present regimen at that time to f/u in 6 months.  ? ?Since virtual visit unlikely to be helpful to clear by protocol, will route to MD for input on clearance to proceed with bronchoscopy, ballpon dilation and possible CO2 laser under general anesthesia. Seems patient is at baseline elevated risk but not clear that additional testing is helpful. Dr. Wyline Mood - Please route response to P CV DIV PREOP (the pre-op pool). Thank you. ? ?Will also route to pharm for input on holding Eliquis, surgeon deferring to [place]. ? ?Laurann Montana, PA-C ?11/01/2021, 2:46 PM ? ? ?

## 2021-11-03 ENCOUNTER — Encounter (HOSPITAL_COMMUNITY): Payer: Self-pay | Admitting: Internal Medicine

## 2021-11-03 ENCOUNTER — Ambulatory Visit (HOSPITAL_COMMUNITY)
Admission: RE | Admit: 2021-11-03 | Discharge: 2021-11-03 | Disposition: A | Discharge status: Home / Self Care | Payer: HMO | Source: Ambulatory Visit | Attending: Acute Care | Admitting: Acute Care

## 2021-11-03 DIAGNOSIS — Z930 Tracheostomy status: Secondary | ICD-10-CM | POA: Diagnosis not present

## 2021-11-03 DIAGNOSIS — Z4682 Encounter for fitting and adjustment of non-vascular catheter: Secondary | ICD-10-CM | POA: Diagnosis not present

## 2021-11-03 DIAGNOSIS — I4891 Unspecified atrial fibrillation: Secondary | ICD-10-CM | POA: Insufficient documentation

## 2021-11-03 DIAGNOSIS — Z430 Encounter for attention to tracheostomy: Secondary | ICD-10-CM | POA: Insufficient documentation

## 2021-11-03 DIAGNOSIS — E662 Morbid (severe) obesity with alveolar hypoventilation: Secondary | ICD-10-CM | POA: Diagnosis not present

## 2021-11-03 DIAGNOSIS — E1122 Type 2 diabetes mellitus with diabetic chronic kidney disease: Secondary | ICD-10-CM | POA: Insufficient documentation

## 2021-11-03 DIAGNOSIS — J386 Stenosis of larynx: Secondary | ICD-10-CM | POA: Insufficient documentation

## 2021-11-03 DIAGNOSIS — I509 Heart failure, unspecified: Secondary | ICD-10-CM | POA: Diagnosis not present

## 2021-11-03 DIAGNOSIS — N182 Chronic kidney disease, stage 2 (mild): Secondary | ICD-10-CM | POA: Diagnosis not present

## 2021-11-03 DIAGNOSIS — J9610 Chronic respiratory failure, unspecified whether with hypoxia or hypercapnia: Secondary | ICD-10-CM | POA: Insufficient documentation

## 2021-11-03 DIAGNOSIS — Z9911 Dependence on respirator [ventilator] status: Secondary | ICD-10-CM | POA: Insufficient documentation

## 2021-11-03 NOTE — Progress Notes (Signed)
Tracheostomy Procedure Note ? ?[name] ?[id number] ?[birth year] ? ?Pre Procedure Tracheostomy Information ? ?Trach BrandVassie Loll ?Size:  6.0 6CN75R ?Style: Cuffed ?Secured by: Sutures ?Lidocaine neb treatment piror to trach change and post trach change ? ?Procedure: Trach change and Trach cleaning ? ? ? ?Post Procedure Tracheostomy Information ? ?Trach BrandVassie Loll ?Size:  6.0 6CN75R ?Style: Cuffed ?Secured by: Velcro ? ? ?Post Procedure Evaluation: ? ?ETCO2 positive color change from yellow to purple : Yes.   ?Vital signs:VSS ?Patients current condition: stable ?Complications: No apparent complications ?Trach site exam: clean, dry ?Wound care done: 4 x 4 gauze drain guaze ?Patient did tolerate procedure well. ? ? ?Education: ?none ? ?Prescription needs: ?none ? ? ? ?Additional needs: ?none ? ? ? ? ? ? ?  ?

## 2021-11-04 NOTE — Progress Notes (Signed)
Reason  for visit  ?Planned trach change ? ?HPI ?71 year old female patient with history of obstructive sleep apnea as well as obesity hypoventilation syndrome with need for nocturnal ventilation.  She lives at home, and her primary care provider is her daughter.  I last saw her on 1/23 at which time she was status post hospitalization after her tracheostomy had been dislodged necessitating tracheostomy revision by ENT.  She presents today for planned tracheostomy change. ?Since last visit: ?No significant changes, however her daughter does note some increased need for suctioning which she attributes to it being time for the trach change.  Also they did see ENT, I am happy to hear she is going to see Dr. Constance Holster, who is planning on evaluation of her subglottic stenosis.  ? ?Review of Systems  ?Constitutional:  Negative for fever, malaise/fatigue and weight loss.  ?HENT: Negative.    ?Eyes: Negative.   ?Respiratory:  Positive for cough and sputum production. Negative for shortness of breath.   ?Cardiovascular:  Negative for chest pain.  ?Gastrointestinal: Negative.   ?Genitourinary: Negative.   ?Musculoskeletal: Negative.   ?Skin: Negative.   ? ? ?Physical exam ?General pleasant 71 year old female patient who remains ventilator dependent she is in no acute distress ?HENT NCAT trach site unremarkable. Still unable to phonate.  ?Pulm dec bases  ?Card rrr ?Abd soft ?Ext trace LE edema ?Neuro intact ? ? ?Trach change: the pt was pre-medicated w/ inhaled lidocaine. The existing trach was removed, site inspected she does have some mild granulation around the stoma site particularly at 6:00 in relation to clock.  The new tracheostomy was inserted without difficulty placement verified with end-tidal CO2 ? 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 ?Discussion ?Doing well in regards to airway management, her tracheostomy is stable, however concern for ongoing subglottic stenosis remains a primary concern given her inability to phonate.  I am happy to see she is got plan to see Dr. Constance Holster in the middle of the month ? ?Plan ?Continue routine trach changes every 4 weeks  ?Follow-up after ENT surgical evaluation I am hopeful if we can treat her subglottic stenosis she may be able to phonate once again  ?Continue nocturnal ventilation, she needs tracheostomy indefinitely  ? ? ?My time 22 min  ? ?Erick Colace ACNP-BC ?[place] ?Pager # [phone] OR # [phone] if no answer ? ?

## 2021-11-08 ENCOUNTER — Telehealth: Payer: Self-pay | Admitting: Internal Medicine

## 2021-11-08 NOTE — Telephone Encounter (Signed)
There is nothing cardiac at this time to limit bronchoscopy. Ok to hold anticoag ? ?Dominga Ferry MD ?

## 2021-11-08 NOTE — Telephone Encounter (Signed)
"  There is nothing cardiac at this time to limit bronchoscopy. Ok to hold anticoag ?  ?Dominga Ferry MD" ?

## 2021-11-08 NOTE — Telephone Encounter (Signed)
Received pulmonary clearance forms from [facility] on patient. Scheduled for Bronchoscopy with Dr. Madalyn Rob on 5/17. Forms placed in surgical clearance box. ?

## 2021-11-11 NOTE — Telephone Encounter (Signed)
This is not a Pulm Bronch this is by Dr Pollyann Kennedy ?

## 2021-11-11 NOTE — Telephone Encounter (Signed)
She is trach dependent so is cleared for bronchoscopy.  ?

## 2021-11-11 NOTE — Telephone Encounter (Signed)
Sent to the wrong pool in error

## 2021-11-11 NOTE — Telephone Encounter (Signed)
Fax received from Dr. Madalyn Rob with [facility] ENT to perform a Bronchoscopy on patient.  Patient needs surgery clearance. Patient was seen on 09/24/2021. Patient surgery is scheduled for 11/17/2021. Office protocol is a risk assessment can be sent to surgeon if patient has been seen in 60 days or less.  ? ?Sending to Dr. Sherene Sires for risk assessment or recommendations if patient needs to be seen in office prior to surgical procedure.   ?

## 2021-11-12 NOTE — Telephone Encounter (Signed)
OV notes and clearance form have been faxed back to [facility] ENT. Nothing further needed at this time.  ?

## 2021-11-15 NOTE — H&P (Signed)
HPI:  ? ?[name] is a 71 y.o. female who presents as a consult Patient.  ? ?Referring Provider: Doreatha Massed,* ? ?Chief complaint: Tracheostomy problem. ? ?HPI: Tracheostomy was placed last year by pulmonary medicine. In January she had some sort of difficulty where they could not get the tracheostomy back into place and she was seen in the emergency department by Dr. Jearld Fenton who was able to replace the tracheostomy. She goes to the tracheostomy clinic and a couple weeks ago when she had her trach changed she was no longer able to phonate. He is having no difficulty breathing but cannot talk and cannot use the Passy-Muir valve. She is on a ventilator at nighttime. ? ?PMH/Meds/All/SocHx/FamHx/ROS:  ? ?Past Medical History:  ?Diagnosis Date  ? Allergy  ? Arthritis  ? CHF (congestive heart failure) (HCC)  ? Diabetes mellitus (HCC)  ? Hypertension  ? ?Past Surgical History:  ?Procedure Laterality Date  ? CHOLECYSTECTOMY  ? HYSTERECTOMY  ? TRACHEAL SURGERY  ? ?No family history of bleeding disorders, wound healing problems or difficulty with anesthesia.  ? ?Social History  ? ?Socioeconomic History  ? Marital status: Married  ?Spouse name: Not on file  ? Number of children: Not on file  ? Years of education: Not on file  ? Highest education level: Not on file  ?Occupational History  ? Not on file  ?Tobacco Use  ? Smoking status: Never  ? Smokeless tobacco: Never  ?Vaping Use  ? Vaping Use: Never used  ?Substance and Sexual Activity  ? Alcohol use: Never  ? Drug use: Never  ? Sexual activity: Never  ?Other Topics Concern  ? Not on file  ?Social History Narrative  ? Not on file  ? ?Social Determinants of Health  ? ?Financial Resource Strain: Not on file  ?Food Insecurity: Not on file  ?Transportation Needs: Not on file  ?Physical Activity: Not on file  ?Stress: Not on file  ?Social Connections: Not on file  ?Housing Stability: Not on file  ? ?Current Outpatient Medications:  ? acetaminophen (TYLENOL) 500 MG  tablet, Take 1 tablet (500 mg total) by mouth., Disp: , Rfl:  ? amiodarone (PACERONE) 200 MG tablet, Take 1 tablet (200 mg total) by mouth daily., Disp: , Rfl:  ? apixaban (ELIQUIS) 5 mg tablet *ANTICOAGULANT*, Take 1 tablet (5 mg total) by mouth., Disp: , Rfl:  ? atorvastatin (LIPITOR) 80 MG tablet, Take 1 tablet (80 mg total) by mouth daily. for cholesterol, Disp: , Rfl:  ? cholecalciferol, vitamin D3, 50 mcg (2,000 unit) Cap, Take 1 capsule by mouth daily., Disp: , Rfl:  ? dicyclomine (BENTYL) 10 MG capsule, Take 1 capsule (10 mg total) by mouth., Disp: , Rfl:  ? donepeziL (ARICEPT) 5 MG tablet, Take 1 tablet (5 mg total) by mouth nightly., Disp: , Rfl:  ? DULoxetine (CYMBALTA) 30 MG DR capsule, Take 1 capsule (30 mg total) by mouth., Disp: , Rfl:  ? FREESTYLE LIBRE 2 SENSOR, FOR CONTINUOUS GLUCOSE MONITORING., Disp: , Rfl:  ? furosemide (LASIX) 40 MG tablet, Take 1 tablet (40 mg total) by mouth daily., Disp: , Rfl:  ? gabapentin (NEURONTIN) 100 MG capsule, TAKE ONE CAPSULE BY MOUTH THREE TIMES DAILY FOR nerve pain, Disp: , Rfl:  ? ipratropium-albuteroL (DUO-NEB) 0.5 mg-3 mg(2.5 mg base)/3 mL nebulizer solution, USE ONE VIAL IN NEBULIZER EVERY 4 HOURS AS NEEDED, Disp: , Rfl:  ? LANTUS SOLOSTAR U-100 INSULIN 100 unit/mL (3 mL) injection, INJECT UP TO 50 UNITS INTO THE SKIN  AT BEDTIME FOR DIABETES AS DIRECTED, Disp: , Rfl:  ? levETIRAcetam (KEPPRA) 500 MG tablet, TAKE 1 TABLET BY MOUTH TWICE DAILY FOR seizure prevention, Disp: , Rfl:  ? levothyroxine (SYNTHROID) 175 MCG tablet, Take 1 tablet (175 mcg total) by mouth., Disp: , Rfl:  ? loperamide (IMODIUM-AD) 2 mg capsule, TAKE ONE CAPSULE BY MOUTH if needed FOR diarrhea OR loose stools, Disp: , Rfl:  ? LORazepam (ATIVAN) 1 MG tablet, Take 1 tablet (1 mg total) by mouth., Disp: , Rfl:  ? meclizine (ANTIVERT) 12.5 mg tablet, Take 1 tablet (12.5 mg total) by mouth 3 (three) times daily as needed., Disp: , Rfl:  ? melatonin 3 mg Tab, Take 1 tablet (3 mg total) by  mouth., Disp: , Rfl:  ? metoPROLOL tartrate (LOPRESSOR) 25 MG tablet, TAKE 1 TABLET BY MOUTH TWICE DAILY FOR HIGH BLOOD PRESSURE OR heart rate control, Disp: , Rfl:  ? NOVOLIN R FLEXPEN 100 unit/mL (3 mL) injection, PLEASE SEE ATTACHED FOR DETAILED DIRECTIONS, Disp: , Rfl:  ? ondansetron (ZOFRAN) 4 MG tablet, TAKE 1 TABLET BY MOUTH FOUR TIMES DAILY AS NEEDED FOR nausea, Disp: , Rfl:  ? pantoprazole (PROTONIX) 40 MG tablet, TAKE 1 TABLET BY MOUTH TWICE DAILY FOR acid reflux, Disp: , Rfl:  ? potassium chloride (KLOR-CON) 10 MEQ CR tablet, TAKE 2 TABLETS BY MOUTH DAILY TO replace potassium, Disp: , Rfl:  ? predniSONE (DELTASONE) 5 MG tablet, TAKE 1 TABLET BY MOUTH EACH DAY FOR YOUR LUNGS., Disp: , Rfl:  ? sertraline (ZOLOFT) 25 MG tablet, Take 1 tablet (25 mg total) by mouth daily., Disp: , Rfl:  ? ?A complete ROS was performed with pertinent positives/negatives noted in the HPI. The remainder of the ROS are negative. ? ? ?Physical Exam:  ? ?Temp 97.2 ?F (36.2 ?C)  ? ?General: Chronically ill elderly lady, in no distress, breathing easily. Normal affect. In a pleasant mood. ?Head: Normocephalic, atraumatic. No masses, or scars. ?Eyes: Pupils are equal, and reactive to light. Vision is grossly intact. No spontaneous or gaze nystagmus. ?Ears: Ear canals are clear. Tympanic membranes are intact, with normal landmarks and the middle ears are clear and healthy. ?Hearing: Grossly normal. ?Nose: Nasal cavities are clear with healthy mucosa, no polyps or exudate. Airways are patent. ?Face: No masses or scars, facial nerve function is symmetric. ?Oral Cavity: No mucosal abnormalities are noted. Tongue with normal mobility. Dentition appears healthy. ?Oropharynx: Tonsils are symmetric. There are no mucosal masses identified. Tongue base appears normal and healthy. ?Larynx/Hypopharynx: indirect exam reveals healthy, mobile vocal cords, without mucosal lesions in the hypopharynx or larynx. ?Chest: Deferred ?Neck: Ileostomy in  place. No palpable masses, no cervical adenopathy, no thyroid nodules or enlargement. ?Neuro: Cranial nerves II-XII with normal function. ?Balance: She is in a wheelchair. ?Other findings: none. ? ?Independent Review of Additional Tests or Records:  ?none ? ?Procedures:  ?none ? ?Impression & Plans:  ?Stable tracheostomy but unable to phonate. I attempted finger occlusion of the tracheostomy and she is not able to move any air. I suspect subglottic stenosis or tracheal stenosis just above the tracheostomy site. Recommend tracheoscopy/bronchoscopy under general anesthesia in the operating room. I will have to get cardiac and pulmonary clearance first.  ?

## 2021-11-16 ENCOUNTER — Other Ambulatory Visit: Payer: Self-pay

## 2021-11-16 ENCOUNTER — Encounter (HOSPITAL_COMMUNITY): Payer: Self-pay | Admitting: Otolaryngology

## 2021-11-16 NOTE — Anesthesia Preprocedure Evaluation (Addendum)
Anesthesia Evaluation  ?Patient identified by MRN, date of birth, ID band ?Patient awake ? ? ? ?Reviewed: ?Allergy & Precautions, NPO status , Patient's Chart, lab work & pertinent test results, reviewed documented beta blocker date and time  ? ?Airway ?Mallampati: II ? ?TM Distance: >3 FB ?Neck ROM: Full ? ? ? Dental ? ?(+) Edentulous Upper, Dental Advisory Given ?  ?Pulmonary ?sleep apnea and Oxygen sleep apnea , pneumonia, resolved, COPD,  oxygen dependent, former smoker,  ?Subglottic stenosis  ?S/P tracheostomy ?  ?Pulmonary exam normal ?breath sounds clear to auscultation ? ? ? ? ? ? Cardiovascular ?Exercise Tolerance: Poor ?hypertension, Pt. on medications ?+ Past MI, +CHF and + DOE  ?Normal cardiovascular exam+ dysrhythmias Atrial Fibrillation  ?Rhythm:Regular Rate:Normal ? ?Echo 01/2021 ?1. Left ventricular ejection fraction, by estimation, is 45 to 50%. Left ventricular ejection fraction by PLAX is 46 %. The left ventricle has mildly decreased function. The left ventricle demonstrates global hypokinesis. There is moderate concentric left ventricular hypertrophy. Left ventricular diastolic parameters are indeterminate.  ??2. Right ventricular systolic function is moderately reduced. The right ventricular size is normal. There is severely elevated pulmonary artery systolic pressure. The estimated right ventricular systolic pressure is  ?60.1 mmHg.  ??3. Tricuspid valve regurgitation is at least moderate.  ??4. The inferior vena cava is normal in size with <50% respiratory variability, suggesting right atrial pressure of 8 mmHg.  ??5. The mitral valve is abnormal. Mild to moderate mitral valve regurgitation, with blunting of systolic pulmonary vein flow. No evidence of mitral stenosis. Severe mitral annular calcification.  ??6. Left atrial size was severely dilated.  ??7. The aortic valve is tricuspid. There is mild calcification of the aortic valve. Aortic valve regurgitation  is not visualized. No aortic stenosis is present.  ?  ?Neuro/Psych ?Seizures -, Well Controlled,  PSYCHIATRIC DISORDERS Anxiety Depression CVA   ? GI/Hepatic ?Neg liver ROS, hiatal hernia, GERD  Medicated,Hx/o gastroparesis ?  ?Endo/Other  ?diabetes, Poorly Controlled, Type 2, Insulin DependentHypothyroidism Hx/o protein calorie malnutrition ? Renal/GU ?Renal InsufficiencyRenal disease  ?negative genitourinary ?  ?Musculoskeletal ? ?(+) Arthritis , Osteoarthritis,  Fibromyalgia - ? Abdominal ?(+) + obese,   ?Peds ? Hematology ? ?(+) Blood dyscrasia, anemia , Thrombocytopenia-mild   ?Anesthesia Other Findings ? ? Reproductive/Obstetrics ? ?  ? ? ? ? ? ? ? ? ? ? ? ? ? ?  ?  ? ? ? ? ? ?Anesthesia Physical ?Anesthesia Plan ? ?ASA: 4 ? ?Anesthesia Plan: General  ? ?Post-op Pain Management: Minimal or no pain anticipated  ? ?Induction: Intravenous and Inhalational ? ?PONV Risk Score and Plan: 4 or greater and Treatment may vary due to age or medical condition and Ondansetron ? ?Airway Management Planned: Tracheostomy ? ?Additional Equipment: None ? ?Intra-op Plan:  ? ?Post-operative Plan: Post-operative intubation/ventilation ? ?Informed Consent: I have reviewed the patients History and Physical, chart, labs and discussed the procedure including the risks, benefits and alternatives for the proposed anesthesia with the patient or authorized representative who has indicated his/her understanding and acceptance.  ? ? ? ?Dental advisory given ? ?Plan Discussed with: CRNA and Anesthesiologist ? ?Anesthesia Plan Comments: (PAT note written 11/16/2021 by Shonna Chock, PA-C. ?)  ? ? ? ? ?Anesthesia Quick Evaluation ? ?

## 2021-11-16 NOTE — Progress Notes (Signed)
Anesthesia Chart Review: ? Case: [id number] Date/Time: 11/17/21 0815  ? Procedure: BRONCHOSCOPY; POSSIBLE CO2 LASER; POSSIBLE BALLOON DILATION  ? Anesthesia type: General  ? Pre-op diagnosis: Subglottic stenosis; Tracheostomy dependent  ? Location: [facility] ROOM 09 / [facility]  ? Surgeons: Serena Colonel, MD  ? ?  ? ? ?DISCUSSION: Patient is a 71 year old female scheduled for the above procedure.  ? ?History includes former smoker (quit 07/01/83), HTN, CAD (NSTEMI type 1 in setting of afib w/ RVR, HF exacerbation, no significant CAD 08/18/20), chronic combined systolic and diastolic CHF, pulmonary hypertension (severe pulmonary hypertension by RHC 08/18/20), PAF (diagnosed 2016), DM2, OSA (severe OSA 08/20/20 sleep study; intolerant to CPAP), GERD, hiatal hernia, IBS, anemia, gastroparesis, hypothyroidism, syncope (2010 with orthostasis, 2016 with dehydration), VDRF (admission for PNA 08/05/17, failed extubation x3, tracheostomy 09/01/17-09/10/17; ARF secondary to PNA, s/p tracheostomy 01/07/21, tracheostomy revision 07/18/21 following dislodgement). ? ?She is followed by pulmonologist Dr. Sherene Sires and by Anders Simmonds, NP at the [facility] clinic. Per 11/03/21, she remains vent dependent at night. Tracheostomy was exchanged with size 6 cuffed Shiley on 11/03/21. He noted, "concern for ongoing subglottic stenosis remains a primary concern given her inability to phonate.  I am happy to see she is got plan to see Dr. Pollyann Kennedy in the middle of the month ? ?Plan ?Continue routine trach changes every 4 weeks  ?Follow-up after ENT surgical evaluation I am hopeful if we can treat her subglottic stenosis she may be able to phonate once again  ?Continue nocturnal ventilation, she needs tracheostomy indefinitely".  ? ?- Per Dr. Sherene Sires on 11/11/21, "She is trach dependent so is cleared for bronchoscopy."  ? ?- ENT also reached out to cardiology. Per Dr. Wyline Mood, "There is nothing cardiac at this time to limit bronchoscopy. Ok to hold anticoag". Per Pharmacy "patient can  hold Eliquis for 1 day prior to procedure. She should resume as soon as safely possible after given her elevated CV risk." Apparently, when RN spoke with patient/family, they were not aware of Eliquis instructions and were told to contact Dr. Lucky Rathke office. I also called and spoke with staff on their physician line and was told they would follow-up with patient. ? ?She is a same day work-up, so anesthesia team to evaluate on the day of surgery.  ? ? ?VS: Ht 5\' 6"  (1.676 m)   Wt 83.5 kg   BMI 29.70 kg/m?  . ?BP Readings from Last 3 Encounters:  ?09/24/21 128/88  ?09/10/21 118/64  ?07/19/21 (!) 130/99  ? ?Pulse Readings from Last 3 Encounters:  ?09/24/21 (!) 58  ?09/10/21 (!) 54  ?07/19/21 71  ?  ? ?PROVIDERS: ?Gareth Morgan, MD is PCP  ?Dina Rich, MD is cardiologist  ?Sandrea Hughs, MD is pulmonologist ? ? ?LABS: For day of surgery as indicated.As of 07/19/21, Cr 1.06, H/H 9.0/29.2, PTL 148, glucose 88.  ? ? ?Sleep Study 08/20/20 [facility]): ?Impressions  ?1. Severe obstructive sleep apnea with AHI = 31.0/hour - associated with  ?oxygen desaturations to a low of 81%, arousals, and disruption of sleep  ?architecture.  ?2. Poor sleep efficiency with most of the night spent awake.  ?3. Hypercapnia with 100% of the night spent at 50 torr or above.  ?4. Irregular rhythm due to known a.fib.   ? ? ?IMAGES: ?CXR 09/27/21: ?IMPRESSION: ?- Improved aeration of the lungs compared to the prior plain film, ?with background of chronic changes and no evidence of acute ?cardiopulmonary disease. ?- Unchanged tracheostomy. ?- Cardiomegaly and  mitral annular calcifications. ? ? ?EKG: 05/24/21: Afib at 63 bpm. LAFB. Cannot rule out anterior infarct (age undetermined). ? ? ?CV: ?Echo 01/03/21: ?IMPRESSIONS  ? 1. Left ventricular ejection fraction, by estimation, is 45 to 50%. Left  ?ventricular ejection fraction by PLAX is 46 %. The left ventricle has  ?mildly decreased function. The left ventricle demonstrates global  ?hypokinesis.  There is moderate concentric  ?left ventricular hypertrophy. Left ventricular diastolic parameters are  ?indeterminate.  ? 2. Right ventricular systolic function is moderately reduced. The right  ?ventricular size is normal. There is severely elevated pulmonary artery  ?systolic pressure. The estimated right ventricular systolic pressure is  ?60.1 mmHg.  ? 3. Tricuspid valve regurgitation is at least moderate.  ? 4. The inferior vena cava is normal in size with <50% respiratory  ?variability, suggesting right atrial pressure of 8 mmHg.  ? 5. The mitral valve is abnormal. Mild to moderate mitral valve  ?regurgitation, with blunting of systolic pulmonary vein flow. No evidence  ?of mitral stenosis. Severe mitral annular calcification.  ? 6. Left atrial size was severely dilated.  ? 7. The aortic valve is tricuspid. There is mild calcification of the  ?aortic valve. Aortic valve regurgitation is not visualized. No aortic  ?stenosis is present.  ?- Comparison(s): A prior study was performed on 11/22/20. Prior images  ?reviewed side by side. LV function appears slightly less vigorous with  ?similar tricuspid regurgitation.  ? ? ?US Carotid 08/29/20: ?IMPRESSION: ?1. Minimal amount of left-sided atherosclerotic plaque, ?morphologically similar to the 2017 examination and again not ?resulting in hemodynamically significant stenosis. ?2. The right carotid system and vertebral artery were not evaluated ?secondary to overlying bandages. While no hemodynamically ?significant narrowing was demonstrated on prior carotid Doppler ?ultrasound performed in 2017, if clinical concern persists, further ?evaluation with CTA of the head and neck could be performed as ?indicated. ?  ? ?RHC/LHC 08/18/20 [facility]): ?RA mean: 20 mmHg  ?RV: 80/16 (21) mmHg  ?PA (mean): 77/38 (53) mmHg  ?PCWP mean: 27 mmHg  ?PA Sat: 63 %  ?Art Sat: 97 %  ?PVR: 3.9 woods units  ?Fick Cardiac output: 6.1 L/min  ?Fick Cardiac index: 2.9 L/min/m2  ?   ?Left Heart  Catheterization  ?Left LVEDP: 27 mmHg  ?   ?Coronary Angiography          ?Aortic Pressure:          119/78 mmHg   ? ?Findings:  ?1. No significant coronary artery disease. [ ?2. Elevated right and left ventricular filling pressures (LVEDP = 27 mm  ?Hg).  ?3. Severe pulmonary hypertension  ? ?Recommendations:  ?Medical Management per heart failure team  ? ? ?Past Medical History:  ?Diagnosis Date  ? Abnormal pulmonary function test   ? Anemia   ? H/H of 10/30 with a normal MCV in 12/09  ? Anxiety   ? Arthritis   ? Barrett's esophagus   ? Diagnosed 1995. Last EGD 2016-NO BARRETT'S.   ? Chest pain   ? a. 2022 Cath: nl cors; b. 2008 Neg myoview; c. 01/2014 Cath: Nl cors. EF 40%; c. 08/2020 NSTEMI/Cath [facility]): nl cors.  ? Chronic anticoagulation   ? Chronic combined systolic and diastolic CHF (congestive heart failure) (HCC)   ? a. 10/2014 Echo: EF40%; b. 01/2015 Echo: EF 55-60%; c. 11/2020 Echo: EF 55-60%, mod LVH, sev BAE, mild MR, mod TR. PASP ; d. 01/2021 Echo: EF 45-50%, glob HK, mod conc LVH, mod reduced RV fxn, RVSP 60.49mmHg,  Mod TR, mild to mod MR, sev dil LA.  ? Chronic LBP   ? Surgical intervention in 1996  ? Diabetes mellitus, type 2 (HCC)   ? Insulin therapy; exacerbated by prednisone  ? Gastroparesis   ? 99% retention 05/2008 on GES  ? GERD (gastroesophageal reflux disease)   ? Heart attack (HCC) 08/13/2020  ? Hiatal hernia   ? Hyperlipidemia   ? Hypertension   ? Hypothyroid   ? IBS (irritable bowel syndrome)   ? Obesity   ? OSA on CPAP   ? had CPAP and cannot tolerate.  ? Paroxysmal atrial fibrillation (HCC)   ? a.CHA2DS2VASc = 6-->eliquis. Also on Amio.  ? Pulmonary hypertension (HCC) 01/2015  ? a. 01/2015 RHC: Predominantly pulmonary venous hypertension but may be component of PAH (PA mean 52, PCWP 31); b. 01/2021 Echo: RVSP 60.55mmHg w/ mod reduced RV fxn.  ? Seizures (HCC)   ? last seizure was 2 years ago; on keppra for this; unknown etiology  ? Syncope   ? a. Admitted 05/2009; magnetic resonance imagin/  MRA - negative; etiology thought to be orthostasis secondary to drugs and dehydration. b. Syncope 02/2015 also felt 2/2 dehydration.  ? ? ?Past Surgical History:  ?Procedure Laterality Date  ? BACK SURGERY

## 2021-11-16 NOTE — Progress Notes (Signed)
PCP - Dr. [name]  ?Cardiologist - Dr. Wyline Mood  ?EKG - 05/25/21 ?Chest x-ray - 09/24/21 ?ECHO - 01/04/21 ?Cardiac Cath - 2016 ?CPAP - no  ?Fasting Blood Sugar:  100-300 ?Checks Blood Sugar:  constantly checks throughout the day, CBG continuous monitor  ?Blood Thinner Instructions: Follow your surgeon's instructions on when to stop Eliquis  If no instructions were given by your surgeon then you will need to call the office to get those instructions.   ? ?Aspirin Instructions:  ?ERAS Protcol -  ?COVID TEST- DOS ? ?Anesthesia review: yes ? ?------------- ? ?SDW INSTRUCTIONS: ? ?Your procedure is scheduled on Wednesday 5/17. Please report to [facility] Main Entrance "A" at 0530 A.M., and check in at the Admitting office. Call this number if you have problems the morning of surgery: [phone] ? ? ?Remember: Do not eat or drink after midnight the night before your surgery ?  ?Medications to take morning of surgery with a sip of water include: ?amiodarone (PACERONE) ?atorvastatin (LIPITOR)  ?DULoxetine (CYMBALTA)  ?gabapentin (NEURONTIN)  ?levETIRAcetam (KEPPRA)  ?levothyroxine (SYNTHROID) ?metoprolol tartrate (LOPRESSOR) ?pantoprazole (PROTONIX) ?predniSONE (DELTASONE)  ? ? ?** PLEASE check your blood sugar the morning of your surgery when you wake up and every 2 hours until you get to the Short Stay unit. ? ?If your blood sugar is less than 70 mg/dL, you will need to treat for low blood sugar: ?Do not take insulin. ?Treat a low blood sugar (less than 70 mg/dL) with ? cup of clear juice (cranberry or apple), 4 glucose tablets, OR glucose gel. ?Recheck blood sugar in 15 minutes after treatment (to make sure it is greater than 70 mg/dL). If your blood sugar is not greater than 70 mg/dL on recheck, call [phone] for further instructions. ?insulin glargine (LANTUS)  ?5/16: PM half usual dose ?5/17: none ? ?NOVOLIN R FLEXPEN RELION  ?5/16: AM usual, PM none ?5/17: CBG>220, take 1/2 usual dose ? ? ?Follow your surgeon's  instructions on when to stop apixaban [facility])   If no instructions were given by your surgeon then you will need to call the office to get those instructions.   ? ? ?As of today, STOP taking any Aspirin (unless otherwise instructed by your surgeon), Aleve, Naproxen, Ibuprofen, Motrin, Advil, Goody's, BC's, all herbal medications, fish oil, and all vitamins. ? ?  ?The Morning of Surgery ?Do not wear jewelry, make-up or nail polish. ?Do not wear lotions, powders, or perfumes, or deodorant ?Do not bring valuables to the hospital. ?[facility] is not responsible for any belongings or valuables. ? ?If you are a smoker, DO NOT Smoke 24 hours prior to surgery ? ?If you wear a CPAP at night please bring your mask the morning of surgery  ? ?Remember that you must have someone to transport you home after your surgery, and remain with you for 24 hours if you are discharged the same day. ? ?Please bring cases for contacts, glasses, hearing aids, dentures or bridgework because it cannot be worn into surgery.  ? ?Patients discharged the day of surgery will not be allowed to drive home.  ? ?Please shower the NIGHT BEFORE/MORNING OF SURGERY (use antibacterial soap like DIAL soap if possible). Wear comfortable clothes the morning of surgery. Oral Hygiene is also important to reduce your risk of infection.  Remember - BRUSH YOUR TEETH THE MORNING OF SURGERY WITH YOUR REGULAR TOOTHPASTE ? ?Patient denies shortness of breath, fever, cough and chest pain.  ? ? ?   ? ?

## 2021-11-17 ENCOUNTER — Other Ambulatory Visit: Payer: Self-pay

## 2021-11-17 ENCOUNTER — Encounter (HOSPITAL_COMMUNITY)
Admission: RE | Disposition: A | Discharge status: Home / Self Care | Payer: Self-pay | Source: Home / Self Care | Attending: Otolaryngology

## 2021-11-17 ENCOUNTER — Ambulatory Visit (HOSPITAL_COMMUNITY)
Admission: RE | Admit: 2021-11-17 | Discharge: 2021-11-17 | Disposition: A | Discharge status: Home / Self Care | Payer: HMO | Attending: Otolaryngology | Admitting: Otolaryngology

## 2021-11-17 ENCOUNTER — Ambulatory Visit (HOSPITAL_COMMUNITY): Payer: HMO | Admitting: Physician Assistant

## 2021-11-17 ENCOUNTER — Ambulatory Visit (HOSPITAL_BASED_OUTPATIENT_CLINIC_OR_DEPARTMENT_OTHER): Payer: HMO | Admitting: Physician Assistant

## 2021-11-17 ENCOUNTER — Encounter (HOSPITAL_COMMUNITY): Payer: Self-pay | Admitting: Otolaryngology

## 2021-11-17 DIAGNOSIS — Z20822 Contact with and (suspected) exposure to covid-19: Secondary | ICD-10-CM | POA: Diagnosis not present

## 2021-11-17 DIAGNOSIS — J449 Chronic obstructive pulmonary disease, unspecified: Secondary | ICD-10-CM | POA: Diagnosis not present

## 2021-11-17 DIAGNOSIS — Z87891 Personal history of nicotine dependence: Secondary | ICD-10-CM | POA: Diagnosis not present

## 2021-11-17 DIAGNOSIS — I110 Hypertensive heart disease with heart failure: Secondary | ICD-10-CM | POA: Insufficient documentation

## 2021-11-17 DIAGNOSIS — Z6829 Body mass index (BMI) 29.0-29.9, adult: Secondary | ICD-10-CM | POA: Diagnosis not present

## 2021-11-17 DIAGNOSIS — Z930 Tracheostomy status: Secondary | ICD-10-CM | POA: Diagnosis not present

## 2021-11-17 DIAGNOSIS — I5042 Chronic combined systolic (congestive) and diastolic (congestive) heart failure: Secondary | ICD-10-CM | POA: Insufficient documentation

## 2021-11-17 DIAGNOSIS — L980 Pyogenic granuloma: Secondary | ICD-10-CM | POA: Diagnosis not present

## 2021-11-17 DIAGNOSIS — I252 Old myocardial infarction: Secondary | ICD-10-CM | POA: Diagnosis not present

## 2021-11-17 DIAGNOSIS — E1165 Type 2 diabetes mellitus with hyperglycemia: Secondary | ICD-10-CM | POA: Insufficient documentation

## 2021-11-17 DIAGNOSIS — K219 Gastro-esophageal reflux disease without esophagitis: Secondary | ICD-10-CM | POA: Insufficient documentation

## 2021-11-17 DIAGNOSIS — E039 Hypothyroidism, unspecified: Secondary | ICD-10-CM | POA: Insufficient documentation

## 2021-11-17 DIAGNOSIS — Z794 Long term (current) use of insulin: Secondary | ICD-10-CM | POA: Insufficient documentation

## 2021-11-17 DIAGNOSIS — J386 Stenosis of larynx: Secondary | ICD-10-CM | POA: Insufficient documentation

## 2021-11-17 DIAGNOSIS — Z9981 Dependence on supplemental oxygen: Secondary | ICD-10-CM | POA: Insufficient documentation

## 2021-11-17 DIAGNOSIS — I509 Heart failure, unspecified: Secondary | ICD-10-CM | POA: Diagnosis not present

## 2021-11-17 DIAGNOSIS — I4891 Unspecified atrial fibrillation: Secondary | ICD-10-CM | POA: Insufficient documentation

## 2021-11-17 DIAGNOSIS — M797 Fibromyalgia: Secondary | ICD-10-CM | POA: Diagnosis not present

## 2021-11-17 DIAGNOSIS — E669 Obesity, unspecified: Secondary | ICD-10-CM | POA: Insufficient documentation

## 2021-11-17 HISTORY — PX: MICROLARYNGOSCOPY WITH LASER AND BALLOON DILATION: SHX5973

## 2021-11-17 LAB — CBC
HCT: 30.8 % — ABNORMAL LOW (ref 36.0–46.0)
Hemoglobin: 9.7 g/dL — ABNORMAL LOW (ref 12.0–15.0)
MCH: 29.9 pg (ref 26.0–34.0)
MCHC: 31.5 g/dL (ref 30.0–36.0)
MCV: 95.1 fL (ref 80.0–100.0)
Platelets: 198 10*3/uL (ref 150–400)
RBC: 3.24 MIL/uL — ABNORMAL LOW (ref 3.87–5.11)
RDW: 15.9 % — ABNORMAL HIGH (ref 11.5–15.5)
WBC: 10.6 10*3/uL — ABNORMAL HIGH (ref 4.0–10.5)
nRBC: 0 % (ref 0.0–0.2)

## 2021-11-17 LAB — BASIC METABOLIC PANEL
Anion gap: 6 (ref 5–15)
BUN: 18 mg/dL (ref 8–23)
CO2: 23 mmol/L (ref 22–32)
Calcium: 9.3 mg/dL (ref 8.9–10.3)
Chloride: 111 mmol/L (ref 98–111)
Creatinine, Ser: 1 mg/dL (ref 0.44–1.00)
GFR, Estimated: 60 mL/min — ABNORMAL LOW (ref >60)
Glucose, Bld: 158 mg/dL — ABNORMAL HIGH (ref 70–99)
Potassium: 3.8 mmol/L (ref 3.5–5.1)
Sodium: 140 mmol/L (ref 135–145)

## 2021-11-17 LAB — GLUCOSE, CAPILLARY
Glucose-Capillary: 163 mg/dL — ABNORMAL HIGH (ref 70–99)
Glucose-Capillary: 213 mg/dL — ABNORMAL HIGH (ref 70–99)

## 2021-11-17 LAB — SARS CORONAVIRUS 2 BY RT PCR: SARS Coronavirus 2 by RT PCR: NEGATIVE

## 2021-11-17 SURGERY — MICROLARYNGOSCOPY WITH LASER AND BALLOON DILATION
Anesthesia: General | Site: Mouth

## 2021-11-17 MED ORDER — LIDOCAINE 2% (20 MG/ML) 5 ML SYRINGE
INTRAMUSCULAR | Status: AC
  Filled 2021-11-17: qty 5

## 2021-11-17 MED ORDER — OXYMETAZOLINE HCL 0.05 % NA SOLN
NASAL | Status: DC | PRN
  Administered 2021-11-17: 1

## 2021-11-17 MED ORDER — ROCURONIUM BROMIDE 10 MG/ML (PF) SYRINGE
PREFILLED_SYRINGE | INTRAVENOUS | Status: DC | PRN
Start: 2021-11-17 — End: 2021-11-17
  Administered 2021-11-17: 40 mg via INTRAVENOUS

## 2021-11-17 MED ORDER — ONDANSETRON HCL 4 MG/2ML IJ SOLN
INTRAMUSCULAR | Status: DC | PRN
  Administered 2021-11-17: 4 mg via INTRAVENOUS

## 2021-11-17 MED ORDER — ALBUTEROL SULFATE (2.5 MG/3ML) 0.083% IN NEBU
INHALATION_SOLUTION | RESPIRATORY_TRACT | Status: AC
  Filled 2021-11-17: qty 3

## 2021-11-17 MED ORDER — DEXAMETHASONE SODIUM PHOSPHATE 10 MG/ML IJ SOLN
INTRAMUSCULAR | Status: DC | PRN
  Administered 2021-11-17: 4 mg via INTRAVENOUS

## 2021-11-17 MED ORDER — ROCURONIUM BROMIDE 10 MG/ML (PF) SYRINGE
PREFILLED_SYRINGE | INTRAVENOUS | Status: AC
  Filled 2021-11-17: qty 10

## 2021-11-17 MED ORDER — PROPOFOL 10 MG/ML IV BOLUS
INTRAVENOUS | Status: AC
  Filled 2021-11-17: qty 20

## 2021-11-17 MED ORDER — FENTANYL CITRATE (PF) 100 MCG/2ML IJ SOLN: 25.0000 ug | INTRAMUSCULAR | Status: DC | PRN

## 2021-11-17 MED ORDER — 0.9 % SODIUM CHLORIDE (POUR BTL) OPTIME
TOPICAL | Status: DC | PRN
  Administered 2021-11-17: 1000 mL

## 2021-11-17 MED ORDER — DEXAMETHASONE SODIUM PHOSPHATE 10 MG/ML IJ SOLN
INTRAMUSCULAR | Status: AC
  Filled 2021-11-17: qty 2

## 2021-11-17 MED ORDER — FENTANYL CITRATE (PF) 250 MCG/5ML IJ SOLN
INTRAMUSCULAR | Status: AC
  Filled 2021-11-17: qty 5

## 2021-11-17 MED ORDER — LEVETIRACETAM 500 MG PO TABS
500.0000 mg | ORAL_TABLET | Freq: Once | ORAL | Status: AC
  Administered 2021-11-17: 500 mg via ORAL
  Filled 2021-11-17: qty 1

## 2021-11-17 MED ORDER — LACTATED RINGERS IV SOLN: INTRAVENOUS | Status: DC

## 2021-11-17 MED ORDER — SUGAMMADEX SODIUM 200 MG/2ML IV SOLN
INTRAVENOUS | Status: DC | PRN
Start: 2021-11-17 — End: 2021-11-17
  Administered 2021-11-17: 200 mg via INTRAVENOUS

## 2021-11-17 MED ORDER — CHLORHEXIDINE GLUCONATE 0.12 % MT SOLN
15.0000 mL | Freq: Once | OROMUCOSAL | Status: AC
  Administered 2021-11-17: 15 mL via OROMUCOSAL
  Filled 2021-11-17: qty 15

## 2021-11-17 MED ORDER — INSULIN ASPART 100 UNIT/ML IJ SOLN: 0.0000 [IU] | INTRAMUSCULAR | Status: DC | PRN

## 2021-11-17 MED ORDER — ORAL CARE MOUTH RINSE: 15.0000 mL | Freq: Once | OROMUCOSAL | Status: AC

## 2021-11-17 MED ORDER — OXYMETAZOLINE HCL 0.05 % NA SOLN
NASAL | Status: AC
  Filled 2021-11-17: qty 30

## 2021-11-17 MED ORDER — FENTANYL CITRATE (PF) 100 MCG/2ML IJ SOLN
INTRAMUSCULAR | Status: DC | PRN
  Administered 2021-11-17: 50 ug via INTRAVENOUS

## 2021-11-17 MED ORDER — LIDOCAINE 2% (20 MG/ML) 5 ML SYRINGE
INTRAMUSCULAR | Status: DC | PRN
  Administered 2021-11-17: 80 mg via INTRAVENOUS

## 2021-11-17 MED ORDER — ALBUTEROL SULFATE (2.5 MG/3ML) 0.083% IN NEBU
2.5000 mg | INHALATION_SOLUTION | Freq: Four times a day (QID) | RESPIRATORY_TRACT | Status: DC | PRN
  Administered 2021-11-17: 2.5 mg via RESPIRATORY_TRACT

## 2021-11-17 MED ORDER — SUCCINYLCHOLINE CHLORIDE 200 MG/10ML IV SOSY
PREFILLED_SYRINGE | INTRAVENOUS | Status: AC
  Filled 2021-11-17: qty 10

## 2021-11-17 MED ORDER — STERILE WATER FOR IRRIGATION IR SOLN
Status: DC | PRN
  Administered 2021-11-17: 1000 mL

## 2021-11-17 MED ORDER — EPINEPHRINE PF 1 MG/ML IJ SOLN
INTRAMUSCULAR | Status: DC | PRN
  Administered 2021-11-17: 1 mg

## 2021-11-17 MED ORDER — ONDANSETRON HCL 4 MG/2ML IJ SOLN: 4.0000 mg | Freq: Once | INTRAMUSCULAR | Status: DC | PRN

## 2021-11-17 MED ORDER — ONDANSETRON HCL 4 MG/2ML IJ SOLN
INTRAMUSCULAR | Status: AC
  Filled 2021-11-17: qty 4

## 2021-11-17 MED ORDER — EPINEPHRINE HCL (NASAL) 0.1 % NA SOLN
NASAL | Status: AC
  Filled 2021-11-17: qty 30

## 2021-11-17 MED ORDER — PROPOFOL 10 MG/ML IV BOLUS
INTRAVENOUS | Status: DC | PRN
  Administered 2021-11-17: 50 mg via INTRAVENOUS

## 2021-11-17 SURGICAL SUPPLY — 20 items
BNDG EYE OVAL (GAUZE/BANDAGES/DRESSINGS) ×6 IMPLANT
CANISTER SUCT 3000ML PPV (MISCELLANEOUS) ×3 IMPLANT
COVER BACK TABLE 60X90IN (DRAPES) ×3 IMPLANT
COVER MAYO STAND STRL (DRAPES) ×3 IMPLANT
DRAPE HALF SHEET 40X57 (DRAPES) ×3 IMPLANT
GAUZE 4X4 16PLY ~~LOC~~+RFID DBL (SPONGE) ×3 IMPLANT
GLOVE ECLIPSE 7.5 STRL STRAW (GLOVE) ×3 IMPLANT
GOWN STRL REUS W/ TWL LRG LVL3 (GOWN DISPOSABLE) ×4 IMPLANT
GOWN STRL REUS W/TWL LRG LVL3 (GOWN DISPOSABLE) ×4
GUARD TEETH (MISCELLANEOUS) ×1 IMPLANT
KIT BASIN OR (CUSTOM PROCEDURE TRAY) ×3 IMPLANT
NS IRRIG 1000ML POUR BTL (IV SOLUTION) ×3 IMPLANT
PATTIES SURGICAL .5 X3 (DISPOSABLE) ×4 IMPLANT
SOL ANTI FOG 6CC (MISCELLANEOUS) ×2 IMPLANT
SOLUTION ANTI FOG 6CC (MISCELLANEOUS) ×1
SURGILUBE 2OZ TUBE FLIPTOP (MISCELLANEOUS) ×1 IMPLANT
SYR GAUGE ASSEMBLY ALLIANCE II (MISCELLANEOUS) ×1 IMPLANT
TOWEL GREEN STERILE FF (TOWEL DISPOSABLE) ×3 IMPLANT
TUBE TRACH  6.0 CUFF FLEX (MISCELLANEOUS) ×2
TUBE TRACH 6.0 CUFF FLEX (MISCELLANEOUS) IMPLANT

## 2021-11-17 NOTE — Op Note (Signed)
OPERATIVE REPORT ? ?DATE OF SURGERY: 11/17/2021 ? ?PATIENT:  [name],  71 y.o. female ? ?PRE-OPERATIVE DIAGNOSIS:  Subglottic stenosis; Tracheostomy dependent ? ?POST-OPERATIVE DIAGNOSIS:  Subglottic stenosis; Tracheostomy dependent ? ?PROCEDURE:  Procedure(s): ?MICROLARYNGOSCOPY WITH CO2 LASER, SUBGLOTTIC LARYNGEAL MASS BIOSPY ? ?SURGEON:  Beckie Salts, MD ? ?ASSISTANTS: none ? ?ANESTHESIA:   General  ? ?EBL:  30 ml ? ?DRAINS: None ? ?LOCAL MEDICATIONS USED:  None ? ?SPECIMEN: Right subglottic mass ? ?COUNTS:  Correct ? ?PROCEDURE DETAILS: ?The patient was taken to the operating room and placed on the operating table in the supine position. Following induction of general endotracheal anesthesia, and endotracheal tube, reinforce, was placed into the tracheostomy site and secured in place with tape.  The table was turned 90 degrees..  The patient was draped for endoscopic surgery.  A laser safe laryngoscope was entered into the oral cavity and used to visualize the larynx.  It was attached the [place] stand with the suspension apparatus.  The operating microscope was brought into view.  The larynx and subglottic region were inspected.  There is a large granulomatous appearing mass completely occluding the airway just below the cords.  The seem to be arising attached to the right posterior subglottis.  This was removed in a piecemeal fashion using a large biopsy forceps.  A smaller forceps was then used to remove residual pieces.  Topical adrenaline was applied on pledgets.  Hemostasis was achieved.  At this point I was able to visualize the endotracheal tube nicely.  There is no evidence of any subglottic stenosis.  The CO2 laser was then attached to the microscope and used at a power of 3 W continuous power and used to remove any residual granulomatous tissue along the edges posteriorly of the subglottic bilaterally.  Additional adrenaline was applied on pledgets.  Once hemostasis was achieved the endoscope was  removed.  The tracheostomy was replaced with a cuffed trach tube.  Patient was awakened and transferred to PACU in stable condition. ? ? ? ?PATIENT DISPOSITION:  To PACU, stable ? ? ? ?

## 2021-11-17 NOTE — Interval H&P Note (Signed)
History and Physical Interval Note: ? ?11/17/2021 ?8:24 AM ? ?[name]  has presented today for surgery, with the diagnosis of Subglottic stenosis; Tracheostomy dependent.  The various methods of treatment have been discussed with the patient and family. After consideration of risks, benefits and other options for treatment, the patient has consented to  Procedure(s): ?BRONCHOSCOPY; POSSIBLE CO2 LASER; POSSIBLE BALLOON DILATION (N/A) as a surgical intervention.  The patient's history has been reviewed, patient examined, no change in status, stable for surgery.  I have reviewed the patient's chart and labs.  Questions were answered to the patient's satisfaction.   ? ? ?[name] ? ? ?

## 2021-11-17 NOTE — Anesthesia Postprocedure Evaluation (Signed)
Anesthesia Post Note ? ?Patient: [name] ? ?Procedure(s) Performed: MICROLARYNGOSCOPY WITH CO2 LASER, SUBGLOTTIC LARYNGEAL MASS BIOSPY (Mouth) ? ?  ? ?Patient location during evaluation: PACU ?Anesthesia Type: General ?Level of consciousness: awake and alert and oriented ?Pain management: pain level controlled ?Vital Signs Assessment: post-procedure vital signs reviewed and stable ?Respiratory status: spontaneous breathing, nonlabored ventilation, respiratory function stable and patient connected to tracheostomy mask oxygen ?Cardiovascular status: blood pressure returned to baseline and stable ?Postop Assessment: no apparent nausea or vomiting ?Anesthetic complications: no ? ? ?No notable events documented. ? ?Last Vitals:  ?Vitals:  ? 11/17/21 1010 11/17/21 1025  ?BP: (!) 88/66 (!) 129/44  ?Pulse: 77 79  ?Resp: 18 16  ?Temp:  36.7 ?C  ?SpO2: 94% 94%  ?  ?Last Pain:  ?Vitals:  ? 11/17/21 1025  ?TempSrc:   ?PainSc: 0-No pain  ? ? ?  ?  ?  ?  ?  ?  ? ?[place] A. ? ? ? ? ?

## 2021-11-17 NOTE — Transfer of Care (Signed)
Immediate Anesthesia Transfer of Care Note ? ?Patient: [name] ? ?Procedure(s) Performed: MICROLARYNGOSCOPY WITH CO2 LASER, SUBGLOTTIC LARYNGEAL MASS BIOSPY (Mouth) ? ?Patient Location: PACU ? ?Anesthesia Type:General ? ?Level of Consciousness: awake, alert  and oriented ? ?Airway & Oxygen Therapy: Patient Spontanous Breathing and Patient connected to tracheostomy mask oxygen ? ?Post-op Assessment: Report given to RN and Post -op Vital signs reviewed and stable ? ?Post vital signs: Reviewed and stable ? ?Last Vitals:  ?Vitals Value Taken Time  ?BP 82/64 11/17/21 0925  ?Temp    ?Pulse 81 11/17/21 0927  ?Resp 14   ?SpO2 90 % 11/17/21 0927  ?Vitals shown include unvalidated device data. ? ?Last Pain:  ?Vitals:  ? 11/17/21 0702  ?TempSrc: Oral  ?PainSc: 0-No pain  ?   ? ?  ? ?Complications: No notable events documented. ?

## 2021-11-17 NOTE — Anesthesia Procedure Notes (Signed)
Procedure Name: Intubation ?Date/Time: 11/17/2021 8:47 AM ?Performed by: Serena Colonel, MD ?Pre-anesthesia Checklist: Patient identified, Emergency Drugs available, Suction available and Patient being monitored ?Patient Re-evaluated:Patient Re-evaluated prior to induction ?Oxygen Delivery Method: Circle system utilized ?Preoxygenation: Pre-oxygenation with 100% oxygen ?Induction Type: Tracheostomy and Combination inhalational/ intravenous induction ?Tube type: Reinforced ?Tube size: 6.0 mm ?Number of attempts: 1 ?Airway Equipment and Method: Tracheostomy ?Placement Confirmation: positive ETCO2 and breath sounds checked- equal and bilateral ?Secured at: 11 cm ?Tube secured with: Tape ?Dental Injury: Teeth and Oropharynx as per pre-operative assessment  ?Comments: Tracheostomy tube removed by Dr. Pollyann Kennedy. Replace with reinforced 6.0 ETT via tracheostomy stoma by Dr. Pollyann Kennedy.  ? ? ? ? ?

## 2021-11-18 ENCOUNTER — Encounter (HOSPITAL_COMMUNITY): Payer: Self-pay | Admitting: Otolaryngology

## 2021-11-18 LAB — SURGICAL PATHOLOGY

## 2021-12-28 ENCOUNTER — Ambulatory Visit (HOSPITAL_COMMUNITY)
Admission: RE | Admit: 2021-12-28 | Discharge: 2021-12-28 | Disposition: A | Discharge status: Home / Self Care | Payer: HMO | Source: Ambulatory Visit | Attending: Acute Care | Admitting: Acute Care

## 2021-12-28 DIAGNOSIS — Z930 Tracheostomy status: Secondary | ICD-10-CM | POA: Insufficient documentation

## 2021-12-28 NOTE — Progress Notes (Signed)
Reason  for visit  Planned trach change  HPI 71 year old female patient with history of obstructive sleep apnea as well as obesity hypoventilation syndrome with need for nocturnal ventilation.  She lives at home, and her primary care provider is her daughter.  I last saw her on 1/23 at which time she was status post hospitalization after her tracheostomy had been dislodged necessitating tracheostomy revision by ENT.  She presents today for planned tracheostomy change.  Since last visit: Went to OR w/ Dr Pollyann Kennedy 5/17 underwent microlarygoscopy w/ CO2 laser and subglottic laryngeal mass bx.  Findings:  "There is a large granulomatous appearing mass completely occluding the airway just below the cords.  The seem to be arising attached to the right posterior subglottis.  This was removed in a piecemeal fashion using a large biopsy forceps.  A smaller forceps was then used to remove residual pieces...after that    There is no evidence of any subglottic stenosis.  The CO2 laser was then attached to the microscope and used at a power of 3 W continuous power and used to remove any residual granulomatous tissue along the edges posteriorly of the subglottic bilaterally." 5/31 ENT f/u  Flexible laryngoscopy "shows patent anterior nasal cavity with minimal crusting, no discharge or infection. Normal base of tongue and supraglottis Normal vocal cord mobility without vocal cord nodule, mass, polyp or tumor. There is significant edema of the bilateral true vocal folds; I am unable to visualize the subglottis. Hypopharynx normal without mass, pooling of secretions or aspiration" recommended limited PMV trials (removing if SOB)and on-going obs. 6/13 ENT f/u: no improvement on PMV tolerance. Referral made to [facility] for academic/tertiary care consultation   Review of Systems  Constitutional: Negative.   HENT:         Still not able to verbalize  Eyes: Negative.   Respiratory: Negative.    Cardiovascular: Negative.    Gastrointestinal: Negative.   Genitourinary: Negative.   Musculoskeletal: Negative.   Skin: Negative.      Physical exam Physical Exam Constitutional:      Appearance: Normal appearance. She is not ill-appearing or toxic-appearing.  HENT:     Head: Normocephalic and atraumatic.     Nose: Nose normal. No congestion.     Mouth/Throat:     Mouth: Mucous membranes are moist.  Eyes:     Pupils: Pupils are equal, round, and reactive to light.  Neck:     Comments: Trach site unremarkable but still unable to tolerate trach occlusion. Still not able to phonate Cardiovascular:     Rate and Rhythm: Normal rate.     Pulses: Normal pulses.  Pulmonary:     Effort: Pulmonary effort is normal.     Breath sounds: Rhonchi present.  Abdominal:     General: Bowel sounds are normal.     Palpations: Abdomen is soft.  Musculoskeletal:        General: Normal range of motion.     Cervical back: Normal range of motion.  Skin:    General: Skin is warm.     Capillary Refill: Capillary refill takes less than 2 seconds.  Neurological:     General: No focal deficit present.     Mental Status: She is alert.  Psychiatric:        Mood and Affect: Mood normal.       Trach change:  Trach removed. Site inspected and found to be unremarkable. New trach placed over obturator. Placement confirmed via ETCO2. Pt tolerated well  Impression/plan Chronic respiratory failure Obesity hypoventilation syndrome Possible subglottic stenosis  Obstructive sleep apnea Tracheostomy status, has size 6 cuffed Shiley History of atrial fibrillation History of CKD stage II History of heart failure PEF Type 2 diabetes  Discussion Stable trach., still not able to phonate.   Plan ROV 4 weeks for planned trach change Await results of [facility] consultation  Cont nocturnal ventilation  At this point I still believe she needs trach indefinitely, the goal of [facility] consultation immediately would be to give her voice  back     My time 32 min   Simonne Martinet ACNP-BC [facility] Pulmonary/Critical Care Pager # [phone] OR # [phone] if no answer

## 2021-12-30 ENCOUNTER — Other Ambulatory Visit (HOSPITAL_COMMUNITY): Payer: Self-pay | Admitting: *Deleted

## 2021-12-30 NOTE — H&P (Signed)
Surgical History & Physical  Patient Name: [name] DOB: 08/21/1950  Surgery: Cataract extraction with intraocular lens implant phacoemulsification; Right Eye  Surgeon: [name] MD Surgery Date:  01-10-22 Pre-Op Date:  12-23-21  HPI: A 59 Yr. old female patient 1. The patient complains of difficulty when reading fine print, books, newspaper, instructions etc., which began 1 year ago. Both eyes are affected. The condition's severity increased since last visit. Symptoms occur when the patient is inside, outside and reading. This is negatively affecting the patient's quality of life and the patient is unable to function adequately in life with the current level of vision. is referred by Dr Charise Killian for cataract eval. HPI was performed by [name] .  Medical History: Dry Eyes Cataracts OU: Diabetic Retinopathy Diabetes Elevated cholesterol, gastroesophageal reflux, sei... Heart Problem High Blood Pressure Lung Problems Thyroid Problems  Review of Systems Negative Allergic/Immunologic Negative Cardiovascular Negative Constitutional Negative Ear, Nose, Mouth & Throat Negative Endocrine Negative Eyes Negative Gastrointestinal Negative Genitourinary Negative Hemotologic/Lymphatic Negative Integumentary Negative Musculoskeletal Negative Neurological Negative Psychiatry Negative Respiratory  Social   Former smoker   Medication Visine, Aricept, Diazepam, Eliquis, Glipizide, Keppra, Metformin, Simvastatin,   Sx/Procedures Tracheostomy, Gallbladder Removal, Hysterectomy, Back Surgery,   Drug Allergies  Seasonal allergies, Codeine,   History & Physical: Heent: Cataract, Right eye NECK: supple without bruits LUNGS: lungs clear to auscultation CV: regular rate and rhythm Abdomen: soft and non-tender Impression & Plan: Assessment: 1.  COMBINED FORMS AGE RELATED CATARACT; Both Eyes (H25.813) 2.  RETINAL DEGENERATION, LATTICE (H35.412) 3.  DM Type 2; Both Mild  Without ME (Z02.5852) 4.  BLEPHARITIS; Right Upper Lid, Right Lower Lid, Left Upper Lid, Left Lower Lid (H01.001, H01.002,H01.004,H01.005)  Plan: 1.  Cataract accounts for the patient's decreased vision. This visual impairment is not correctable with a tolerable change in glasses or contact lenses. Cataract surgery with an implantation of a new lens should significantly improve the visual and functional status of the patient. Discussed all risks, benefits, alternatives, and potential complications. Discussed the procedures and recovery. Patient desires to have surgery. A-scan ordered and performed today for intra-ocular lens calculations. The surgery will be performed in order to improve vision for driving, reading, and for eye examinations. Recommend phacoemulsification with intra-ocular lens. Recommend Dextenza for post-operative pain and inflammation. Right Eye - worse, first. Dilates poorly - shugarcaine by protocol. Malyugin Ring. Omidira.  2.  Findings, prognosis and treatment options reviewed. Options of observation and prophylactic treatment discussed with patient. RD precautions given.  3.  seen on previous exams. Difficult retina exam due to cataracts. Recommend cataract surgery to help with eye exams.  4.  Recommend regular lid cleaning

## 2022-01-03 ENCOUNTER — Encounter (HOSPITAL_COMMUNITY)
Admission: RE | Admit: 2022-01-03 | Discharge: 2022-01-03 | Disposition: A | Discharge status: Home / Self Care | Payer: HMO | Source: Ambulatory Visit | Attending: Ophthalmology | Admitting: Ophthalmology

## 2022-01-10 ENCOUNTER — Ambulatory Visit (HOSPITAL_COMMUNITY)
Admission: RE | Admit: 2022-01-10 | Discharge: 2022-01-10 | Disposition: A | Discharge status: Home / Self Care | Payer: HMO | Attending: Ophthalmology | Admitting: Ophthalmology

## 2022-01-10 ENCOUNTER — Encounter (HOSPITAL_COMMUNITY)
Admission: RE | Disposition: A | Discharge status: Home / Self Care | Payer: Self-pay | Source: Home / Self Care | Attending: Ophthalmology

## 2022-01-10 ENCOUNTER — Ambulatory Visit (HOSPITAL_COMMUNITY): Payer: HMO | Admitting: Anesthesiology

## 2022-01-10 ENCOUNTER — Encounter (HOSPITAL_COMMUNITY): Payer: Self-pay | Admitting: Ophthalmology

## 2022-01-10 ENCOUNTER — Ambulatory Visit (HOSPITAL_BASED_OUTPATIENT_CLINIC_OR_DEPARTMENT_OTHER): Payer: HMO | Admitting: Anesthesiology

## 2022-01-10 DIAGNOSIS — I509 Heart failure, unspecified: Secondary | ICD-10-CM | POA: Insufficient documentation

## 2022-01-10 DIAGNOSIS — Z9981 Dependence on supplemental oxygen: Secondary | ICD-10-CM | POA: Diagnosis not present

## 2022-01-10 DIAGNOSIS — Z8673 Personal history of transient ischemic attack (TIA), and cerebral infarction without residual deficits: Secondary | ICD-10-CM | POA: Insufficient documentation

## 2022-01-10 DIAGNOSIS — Z7984 Long term (current) use of oral hypoglycemic drugs: Secondary | ICD-10-CM | POA: Diagnosis not present

## 2022-01-10 DIAGNOSIS — H25811 Combined forms of age-related cataract, right eye: Secondary | ICD-10-CM

## 2022-01-10 DIAGNOSIS — H35412 Lattice degeneration of retina, left eye: Secondary | ICD-10-CM | POA: Insufficient documentation

## 2022-01-10 DIAGNOSIS — I4891 Unspecified atrial fibrillation: Secondary | ICD-10-CM | POA: Insufficient documentation

## 2022-01-10 DIAGNOSIS — E1136 Type 2 diabetes mellitus with diabetic cataract: Secondary | ICD-10-CM | POA: Insufficient documentation

## 2022-01-10 DIAGNOSIS — H269 Unspecified cataract: Secondary | ICD-10-CM

## 2022-01-10 DIAGNOSIS — Z87891 Personal history of nicotine dependence: Secondary | ICD-10-CM | POA: Diagnosis not present

## 2022-01-10 DIAGNOSIS — H0100A Unspecified blepharitis right eye, upper and lower eyelids: Secondary | ICD-10-CM | POA: Diagnosis not present

## 2022-01-10 DIAGNOSIS — I10 Essential (primary) hypertension: Secondary | ICD-10-CM

## 2022-01-10 DIAGNOSIS — J449 Chronic obstructive pulmonary disease, unspecified: Secondary | ICD-10-CM | POA: Insufficient documentation

## 2022-01-10 DIAGNOSIS — I110 Hypertensive heart disease with heart failure: Secondary | ICD-10-CM | POA: Insufficient documentation

## 2022-01-10 DIAGNOSIS — I252 Old myocardial infarction: Secondary | ICD-10-CM

## 2022-01-10 DIAGNOSIS — Z79899 Other long term (current) drug therapy: Secondary | ICD-10-CM | POA: Diagnosis not present

## 2022-01-10 DIAGNOSIS — Z930 Tracheostomy status: Secondary | ICD-10-CM | POA: Insufficient documentation

## 2022-01-10 DIAGNOSIS — H0100B Unspecified blepharitis left eye, upper and lower eyelids: Secondary | ICD-10-CM | POA: Diagnosis not present

## 2022-01-10 DIAGNOSIS — Z794 Long term (current) use of insulin: Secondary | ICD-10-CM | POA: Insufficient documentation

## 2022-01-10 DIAGNOSIS — G4730 Sleep apnea, unspecified: Secondary | ICD-10-CM | POA: Insufficient documentation

## 2022-01-10 HISTORY — PX: CATARACT EXTRACTION W/PHACO: SHX586

## 2022-01-10 LAB — GLUCOSE, CAPILLARY: Glucose-Capillary: 116 mg/dL — ABNORMAL HIGH (ref 70–99)

## 2022-01-10 SURGERY — PHACOEMULSIFICATION, CATARACT, WITH IOL INSERTION
Anesthesia: Monitor Anesthesia Care | Site: Eye | Laterality: Right

## 2022-01-10 MED ORDER — EPINEPHRINE PF 1 MG/ML IJ SOLN
INTRAOCULAR | Status: DC | PRN
  Administered 2022-01-10: 500 mL

## 2022-01-10 MED ORDER — NEOMYCIN-POLYMYXIN-DEXAMETH 3.5-10000-0.1 OP SUSP
OPHTHALMIC | Status: DC | PRN
  Administered 2022-01-10: 1 [drp] via OPHTHALMIC

## 2022-01-10 MED ORDER — STERILE WATER FOR IRRIGATION IR SOLN
Status: DC | PRN
  Administered 2022-01-10: 250 mL

## 2022-01-10 MED ORDER — LIDOCAINE HCL (PF) 1 % IJ SOLN
INTRAOCULAR | Status: DC | PRN
  Administered 2022-01-10: 1 mL via OPHTHALMIC

## 2022-01-10 MED ORDER — POVIDONE-IODINE 5 % OP SOLN
OPHTHALMIC | Status: DC | PRN
  Administered 2022-01-10: 1 via OPHTHALMIC

## 2022-01-10 MED ORDER — TROPICAMIDE 1 % OP SOLN
1.0000 [drp] | OPHTHALMIC | Status: AC | PRN
  Administered 2022-01-10 (×3): 1 [drp] via OPHTHALMIC

## 2022-01-10 MED ORDER — BSS IO SOLN
INTRAOCULAR | Status: DC | PRN
  Administered 2022-01-10: 15 mL via INTRAOCULAR

## 2022-01-10 MED ORDER — SODIUM HYALURONATE 10 MG/ML IO SOLUTION
PREFILLED_SYRINGE | INTRAOCULAR | Status: DC | PRN
  Administered 2022-01-10: 0.85 mL via INTRAOCULAR

## 2022-01-10 MED ORDER — EPINEPHRINE PF 1 MG/ML IJ SOLN
INTRAMUSCULAR | Status: AC
  Filled 2022-01-10: qty 2

## 2022-01-10 MED ORDER — PHENYLEPHRINE HCL 2.5 % OP SOLN
1.0000 [drp] | OPHTHALMIC | Status: DC | PRN
  Administered 2022-01-10 (×2): 1 [drp] via OPHTHALMIC

## 2022-01-10 MED ORDER — SODIUM HYALURONATE 23MG/ML IO SOSY
PREFILLED_SYRINGE | INTRAOCULAR | Status: DC | PRN
  Administered 2022-01-10: 0.6 mL via INTRAOCULAR

## 2022-01-10 MED ORDER — LIDOCAINE HCL 3.5 % OP GEL
1.0000 | Freq: Once | OPHTHALMIC | Status: AC
  Administered 2022-01-10: 1 via OPHTHALMIC

## 2022-01-10 MED ORDER — TETRACAINE HCL 0.5 % OP SOLN
1.0000 [drp] | OPHTHALMIC | Status: AC | PRN
  Administered 2022-01-10 (×3): 1 [drp] via OPHTHALMIC

## 2022-01-10 MED ORDER — MIDAZOLAM HCL 2 MG/2ML IJ SOLN
INTRAMUSCULAR | Status: AC
  Filled 2022-01-10: qty 2

## 2022-01-10 SURGICAL SUPPLY — 13 items
CATARACT SUITE SIGHTPATH (MISCELLANEOUS) ×2 IMPLANT
CLOTH BEACON ORANGE TIMEOUT ST (SAFETY) ×3 IMPLANT
EYE SHIELD UNIVERSAL CLEAR (GAUZE/BANDAGES/DRESSINGS) ×1 IMPLANT
FEE CATARACT SUITE SIGHTPATH (MISCELLANEOUS) ×2 IMPLANT
GLOVE BIOGEL PI IND STRL 7.0 (GLOVE) ×4 IMPLANT
GLOVE BIOGEL PI INDICATOR 7.0 (GLOVE) ×2
LENS IOL RAYNER 22.0 (Intraocular Lens) ×2 IMPLANT
LENS IOL RAYONE EMV 22.0 (Intraocular Lens) IMPLANT
PAD ARMBOARD 7.5X6 YLW CONV (MISCELLANEOUS) ×3 IMPLANT
SYR TB 1ML LL NO SAFETY (SYRINGE) ×3 IMPLANT
TAPE SURG TRANSPORE 1 IN (GAUZE/BANDAGES/DRESSINGS) IMPLANT
TAPE SURGICAL TRANSPORE 1 IN (GAUZE/BANDAGES/DRESSINGS) ×2
WATER STERILE IRR 250ML POUR (IV SOLUTION) ×3 IMPLANT

## 2022-01-10 NOTE — Anesthesia Procedure Notes (Signed)
Procedure Name: MAC Date/Time: 01/10/2022 7:35 AM  Performed by: Orlie Dakin, CRNAPre-anesthesia Checklist: Patient identified, Emergency Drugs available, Suction available and Patient being monitored Patient Re-evaluated:Patient Re-evaluated prior to induction Comments: O2 via Trach collar, no EtCO2 monitored.

## 2022-01-10 NOTE — Op Note (Signed)
Date of procedure: 01/10/22  Pre-operative diagnosis:  Visually significant combined form age-related cataract, Right Eye (H25.811)  Post-operative diagnosis:  Visually significant combined form age-related cataract, Right Eye (H25.811)  Procedure: Removal of cataract via phacoemulsification and insertion of intra-ocular lens Rayner [id number] +22.0D into the capsular bag of the Right Eye  Attending surgeon: Gerda Diss. Mayur Duman, MD, MA  Anesthesia: MAC, Topical Akten  Complications: None  Estimated Blood Loss: <31m (minimal)  Specimens: None  Implants: As above  Indications:  Visually significant age-related cataract, Right Eye  Procedure:  The patient was seen and identified in the pre-operative area. The operative eye was identified and dilated.  The operative eye was marked.  Topical anesthesia was administered to the operative eye.     The patient was then to the operative suite and placed in the supine position.  A timeout was performed confirming the patient, procedure to be performed, and all other relevant information.   The patient's face was prepped and draped in the usual fashion for intra-ocular surgery.  A lid speculum was placed into the operative eye and the surgical microscope moved into place and focused.  A superotemporal paracentesis was created using a 20 gauge paracentesis blade.  Shugarcaine was injected into the anterior chamber.  Viscoelastic was injected into the anterior chamber.  A temporal clear-corneal main wound incision was created using a 2.415mmicrokeratome.  A continuous curvilinear capsulorrhexis was initiated using an irrigating cystitome and completed using capsulorrhexis forceps.  Hydrodissection and hydrodeliniation were performed.  Viscoelastic was injected into the anterior chamber.  A phacoemulsification handpiece and a chopper as a second instrument were used to remove the nucleus and epinucleus. The irrigation/aspiration handpiece was used to remove any  remaining cortical material.   The capsular bag was reinflated with viscoelastic, checked, and found to be intact.  The intraocular lens was inserted into the capsular bag.  The irrigation/aspiration handpiece was used to remove any remaining viscoelastic.  The clear corneal wound and paracentesis wounds were then hydrated and checked with Weck-Cels to be watertight.  The lid-speculum was removed.  The drape was removed.  The patient's face was cleaned with a wet and dry 4x4.   Maxitrol was instilled in the eye. A clear shield was taped over the eye. The patient was taken to the post-operative care unit in good condition, having tolerated the procedure well.  Post-Op Instructions: [facility] and will receive all other orders and instructions.

## 2022-01-10 NOTE — Anesthesia Preprocedure Evaluation (Addendum)
Anesthesia Evaluation  Patient identified by MRN, date of birth, ID band Patient awake    Reviewed: Allergy & Precautions, NPO status , Patient's Chart, lab work & pertinent test results, reviewed documented beta blocker date and time   Airway Mallampati: Trach  TM Distance: >3 FB Neck ROM: Limited   Comment: H/o tracheostomy Dental  (+) Dental Advisory Given, Upper Dentures, Lower Dentures   Pulmonary sleep apnea (tracheostomy), Continuous Positive Airway Pressure Ventilation and Oxygen sleep apnea , pneumonia, COPD,  COPD inhaler and oxygen dependent, former smoker,  H/o tracheostomy   Pulmonary exam normal breath sounds clear to auscultation       Cardiovascular Exercise Tolerance: Poor hypertension, Pt. on medications and Pt. on home beta blockers + Past MI, +CHF and + DOE  + dysrhythmias Atrial Fibrillation  Rhythm:Irregular Rate:Bradycardia  1. Left ventricular ejection fraction, by estimation, is 45 to 50%. Left ventricular ejection fraction by PLAX is 46 %. The left ventricle has mildly decreased function. The left ventricle demonstrates global hypokinesis. There is moderate concentric left ventricular hypertrophy. Left ventricular diastolic parameters are indeterminate.  2. Right ventricular systolic function is moderately reduced. The right ventricular size is normal. There is severely elevated pulmonary artery  systolic pressure. The estimated right ventricular systolic pressure is 48.2 mmHg.  3. Tricuspid valve regurgitation is at least moderate.  4. The inferior vena cava is normal in size with <50% respiratory variability, suggesting right atrial pressure of 8 mmHg.  5. The mitral valve is abnormal. Mild to moderate mitral valve regurgitation, with blunting of systolic pulmonary vein flow. No evidence  of mitral stenosis. Severe mitral annular calcification.  6. Left atrial size was severely dilated.  7. The aortic  valve is tricuspid. There is mild calcification of the aortic valve. Aortic valve regurgitation is not visualized. No aortic stenosis is present.   Comparison(s): A prior study was performed on 11/22/20. Prior images  reviewed side by side. LV function appears slightly less vigorous with similar tricuspid regurgitation.    Neuro/Psych Seizures -, Well Controlled,  PSYCHIATRIC DISORDERS Anxiety Depression Dementia  Neuromuscular disease CVA    GI/Hepatic Neg liver ROS, hiatal hernia, GERD  Medicated and Controlled,  Endo/Other  diabetes, Well Controlled, Type 2, Insulin DependentHypothyroidism   Renal/GU Renal InsufficiencyRenal disease  negative genitourinary   Musculoskeletal  (+) Arthritis , Fibromyalgia -  Abdominal   Peds negative pediatric ROS (+)  Hematology  (+) Blood dyscrasia, anemia ,   Anesthesia Other Findings EKG - A fib, LAFB  Reproductive/Obstetrics negative OB ROS                           Anesthesia Physical Anesthesia Plan  ASA: 4  Anesthesia Plan: MAC   Post-op Pain Management: Minimal or no pain anticipated   Induction:   PONV Risk Score and Plan: 0  Airway Management Planned: Nasal Cannula and Natural Airway  Additional Equipment:   Intra-op Plan:   Post-operative Plan:   Informed Consent: I have reviewed the patients History and Physical, chart, labs and discussed the procedure including the risks, benefits and alternatives for the proposed anesthesia with the patient or authorized representative who has indicated his/her understanding and acceptance.     Dental advisory given  Plan Discussed with: CRNA and Surgeon  Anesthesia Plan Comments:        Anesthesia Quick Evaluation

## 2022-01-10 NOTE — Transfer of Care (Signed)
Immediate Anesthesia Transfer of Care Note  Patient: [name]  Procedure(s) Performed: CATARACT EXTRACTION PHACO AND INTRAOCULAR LENS PLACEMENT (IOC) (Right: Eye)  Patient Location: Short Stay  Anesthesia Type:MAC  Level of Consciousness: awake, alert  and oriented  Airway & Oxygen Therapy: Patient Spontanous Breathing  Post-op Assessment: Report given to RN and Post -op Vital signs reviewed and stable  Post vital signs: Reviewed and stable  Last Vitals:  Vitals Value Taken Time  BP    Temp    Pulse    Resp    SpO2      Last Pain:  Vitals:   01/10/22 0714  TempSrc: Oral  PainSc: 0-No pain      Patients Stated Pain Goal: 6 (46/80/32 1224)  Complications: No notable events documented.

## 2022-01-10 NOTE — Anesthesia Postprocedure Evaluation (Signed)
Anesthesia Post Note  Patient: [name]  Procedure(s) Performed: CATARACT EXTRACTION PHACO AND INTRAOCULAR LENS PLACEMENT (IOC) (Right: Eye)  Patient location during evaluation: Phase II Anesthesia Type: MAC Level of consciousness: awake and alert and oriented Pain management: pain level controlled Vital Signs Assessment: post-procedure vital signs reviewed and stable Respiratory status: spontaneous breathing, nonlabored ventilation, respiratory function stable and patient connected to tracheostomy mask oxygen Cardiovascular status: stable and blood pressure returned to baseline Postop Assessment: no apparent nausea or vomiting Anesthetic complications: no   No notable events documented.   Last Vitals:  Vitals:   01/10/22 0714 01/10/22 0757  BP: (!) 125/99 (!) 115/96  Pulse: (!) 57 (!) 55  Resp: 18   Temp: 36.6 C 37.1 C  SpO2: 95% 97%    Last Pain:  Vitals:   01/10/22 0757  TempSrc: Oral  PainSc: 0-No pain                 Sheretta Grumbine C Karianne Nogueira

## 2022-01-10 NOTE — Interval H&P Note (Signed)
History and Physical Interval Note:  01/10/2022 7:19 AM  [name]  has presented today for surgery, with the diagnosis of combined forms age related cataract; right.  The various methods of treatment have been discussed with the patient and family. After consideration of risks, benefits and other options for treatment, the patient has consented to  Procedure(s) with comments: CATARACT EXTRACTION PHACO AND INTRAOCULAR LENS PLACEMENT (IOC) (Right) - right as a surgical intervention.  The patient's history has been reviewed, patient examined, no change in status, stable for surgery.  I have reviewed the patient's chart and labs.  Questions were answered to the patient's satisfaction.     [name]

## 2022-01-10 NOTE — Discharge Instructions (Signed)
Please discharge patient when stable, will follow up today with Dr. Geetika Laborde at the [facility] [place] office at 10:00AM following discharge.  Leave shield in place until visit.  All paperwork with discharge instructions will be given at the office.  [facility] Address:  [address]  [place], [place] [address]  

## 2022-01-11 ENCOUNTER — Encounter (HOSPITAL_COMMUNITY): Payer: Self-pay | Admitting: Ophthalmology

## 2022-01-17 ENCOUNTER — Telehealth: Payer: Self-pay | Admitting: Internal Medicine

## 2022-01-17 NOTE — Telephone Encounter (Signed)
Called spoke with daughter and gave central scheduling #.

## 2022-01-17 NOTE — Addendum Note (Signed)
Addended by: Armstead Peaks on: 01/17/2022 04:36 PM   Modules accepted: Orders

## 2022-01-17 NOTE — Telephone Encounter (Signed)
Patient referral for liver ultrasound has expired and another needs to be put in.  Patient wants to reschedule it.

## 2022-01-18 ENCOUNTER — Encounter (HOSPITAL_COMMUNITY)
Admission: RE | Admit: 2022-01-18 | Discharge: 2022-01-18 | Disposition: A | Discharge status: Home / Self Care | Payer: HMO | Source: Ambulatory Visit | Attending: Ophthalmology | Admitting: Ophthalmology

## 2022-01-18 ENCOUNTER — Encounter: Payer: Self-pay | Admitting: Internal Medicine

## 2022-01-20 NOTE — H&P (Signed)
Surgical History & Physical  Patient Name: [name] DOB: Nov 02, 1950  Surgery: Cataract extraction with intraocular lens implant phacoemulsification; Left Eye  Surgeon: [name] MD Surgery Date:  01-24-22 Pre-Op Date:  01-17-22  HPI: A 73 Yr. old female patient 1. The patient is returning after cataract post-op. The right eye is affected. Status post cataract post-op, which began 1 week ago: Since the last visit, the affected area is doing well. The patient's vision is stable. Patient is following medication instructions. 2. 2. The patient complains of difficulty when reading fine print, books, newspaper, instructions etc., which began 6 months ago. The left eye is affected. Symptoms occur when the patient is inside and reading. This is negatively affecting the patient's quality of life and the patient is unable to function adequately in life with the current level of vision. HPI was performed by [name] .  Medical History: Dry Eyes Cataracts OU: Diabetic Retinopathy Diabetes Elevated cholesterol, gastroesophageal reflux, sei... Heart Problem High Blood Pressure Lung Problems Thyroid Problems  Review of Systems Negative Allergic/Immunologic Negative Cardiovascular Negative Constitutional Negative Ear, Nose, Mouth & Throat Negative Endocrine Negative Eyes Negative Gastrointestinal Negative Genitourinary Negative Hemotologic/Lymphatic Negative Integumentary Negative Musculoskeletal Negative Neurological Negative Psychiatry Negative Respiratory  Social   Former smoker   Medication Visine, Prednisolone-Moxifloxacin-Bromfenac,  Aricept, Diazepam, Eliquis, Glipizide, Keppra, Metformin, Simvastatin,   Sx/Procedures Phaco c IOL OD,  Tracheostomy, Gallbladder Removal, Hysterectomy, Back Surgery,   Drug Allergies  Seasonal allergies, Codeine,   History & Physical: Heent: Cataract, left eye NECK: supple without bruits LUNGS: lungs clear to auscultation CV:  regular rate and rhythm Abdomen: soft and non-tender Impression & Plan: Assessment: 1.  CATARACT EXTRACTION STATUS; Right Eye (Z98.41) 2.  COMBINED FORMS AGE RELATED CATARACT; Left Eye (H25.812) 3.  NUCLEAR SCLEROSIS AGE RELATED; Left Eye (H25.12)  Plan: 1.  1 week after cataract surgery. Doing well with improved vision and normal eye pressure. Call with any problems or concerns. Continue Pred-Moxi-Brom 2x/day for 3 more weeks.  2.  Cataract accounts for the patient's decreased vision. This visual impairment is not correctable with a tolerable change in glasses or contact lenses. Cataract surgery with an implantation of a new lens should significantly improve the visual and functional status of the patient. Discussed all risks, benefits, alternatives, and potential complications. Discussed the procedures and recovery. Patient desires to have surgery. A-scan ordered and performed today for intra-ocular lens calculations. The surgery will be performed in order to improve vision for driving, reading, and for eye examinations. Recommend phacoemulsification with intra-ocular lens. Recommend Dextenza for post-operative pain and inflammation. Left Eye. Surgery required to correct imbalance of vision. Dilates poorly - shugarcaine by protocol. Omidira.  3.

## 2022-01-24 ENCOUNTER — Encounter (HOSPITAL_COMMUNITY)
Admission: RE | Disposition: A | Discharge status: Home / Self Care | Payer: Self-pay | Source: Ambulatory Visit | Attending: Ophthalmology

## 2022-01-24 ENCOUNTER — Ambulatory Visit (HOSPITAL_COMMUNITY): Payer: HMO | Admitting: Certified Registered"

## 2022-01-24 ENCOUNTER — Ambulatory Visit (HOSPITAL_COMMUNITY)
Admission: RE | Admit: 2022-01-24 | Discharge: 2022-01-24 | Disposition: A | Discharge status: Home / Self Care | Payer: HMO | Source: Ambulatory Visit | Attending: Ophthalmology | Admitting: Ophthalmology

## 2022-01-24 ENCOUNTER — Ambulatory Visit (HOSPITAL_BASED_OUTPATIENT_CLINIC_OR_DEPARTMENT_OTHER): Payer: HMO | Admitting: Certified Registered"

## 2022-01-24 DIAGNOSIS — I252 Old myocardial infarction: Secondary | ICD-10-CM | POA: Diagnosis not present

## 2022-01-24 DIAGNOSIS — Z87891 Personal history of nicotine dependence: Secondary | ICD-10-CM

## 2022-01-24 DIAGNOSIS — Z794 Long term (current) use of insulin: Secondary | ICD-10-CM | POA: Diagnosis not present

## 2022-01-24 DIAGNOSIS — I5032 Chronic diastolic (congestive) heart failure: Secondary | ICD-10-CM | POA: Diagnosis not present

## 2022-01-24 DIAGNOSIS — H25812 Combined forms of age-related cataract, left eye: Secondary | ICD-10-CM

## 2022-01-24 DIAGNOSIS — E039 Hypothyroidism, unspecified: Secondary | ICD-10-CM | POA: Diagnosis not present

## 2022-01-24 DIAGNOSIS — I110 Hypertensive heart disease with heart failure: Secondary | ICD-10-CM | POA: Insufficient documentation

## 2022-01-24 DIAGNOSIS — E1136 Type 2 diabetes mellitus with diabetic cataract: Secondary | ICD-10-CM | POA: Insufficient documentation

## 2022-01-24 DIAGNOSIS — I5042 Chronic combined systolic (congestive) and diastolic (congestive) heart failure: Secondary | ICD-10-CM | POA: Insufficient documentation

## 2022-01-24 DIAGNOSIS — Z9981 Dependence on supplemental oxygen: Secondary | ICD-10-CM | POA: Insufficient documentation

## 2022-01-24 DIAGNOSIS — K219 Gastro-esophageal reflux disease without esophagitis: Secondary | ICD-10-CM | POA: Diagnosis not present

## 2022-01-24 DIAGNOSIS — I4891 Unspecified atrial fibrillation: Secondary | ICD-10-CM | POA: Diagnosis not present

## 2022-01-24 DIAGNOSIS — Z930 Tracheostomy status: Secondary | ICD-10-CM | POA: Insufficient documentation

## 2022-01-24 DIAGNOSIS — J449 Chronic obstructive pulmonary disease, unspecified: Secondary | ICD-10-CM | POA: Diagnosis not present

## 2022-01-24 HISTORY — PX: CATARACT EXTRACTION W/PHACO: SHX586

## 2022-01-24 LAB — GLUCOSE, CAPILLARY: Glucose-Capillary: 164 mg/dL — ABNORMAL HIGH (ref 70–99)

## 2022-01-24 SURGERY — PHACOEMULSIFICATION, CATARACT, WITH IOL INSERTION
Anesthesia: Monitor Anesthesia Care | Site: Eye | Laterality: Left

## 2022-01-24 MED ORDER — LIDOCAINE HCL (PF) 1 % IJ SOLN
INTRAOCULAR | Status: DC | PRN
  Administered 2022-01-24: 1 mL via OPHTHALMIC

## 2022-01-24 MED ORDER — NEOMYCIN-POLYMYXIN-DEXAMETH 3.5-10000-0.1 OP SUSP
OPHTHALMIC | Status: DC | PRN
  Administered 2022-01-24: 2 [drp] via OPHTHALMIC

## 2022-01-24 MED ORDER — EPINEPHRINE PF 1 MG/ML IJ SOLN
INTRAMUSCULAR | Status: AC
  Filled 2022-01-24: qty 2

## 2022-01-24 MED ORDER — PHENYLEPHRINE HCL 2.5 % OP SOLN
1.0000 [drp] | OPHTHALMIC | Status: AC | PRN
  Administered 2022-01-24 (×3): 1 [drp] via OPHTHALMIC

## 2022-01-24 MED ORDER — SODIUM HYALURONATE 23MG/ML IO SOSY
PREFILLED_SYRINGE | INTRAOCULAR | Status: DC | PRN
  Administered 2022-01-24: 0.6 mL via INTRAOCULAR

## 2022-01-24 MED ORDER — POVIDONE-IODINE 5 % OP SOLN
OPHTHALMIC | Status: DC | PRN
  Administered 2022-01-24: 1 via OPHTHALMIC

## 2022-01-24 MED ORDER — TETRACAINE HCL 0.5 % OP SOLN
1.0000 [drp] | OPHTHALMIC | Status: AC | PRN
  Administered 2022-01-24 (×3): 1 [drp] via OPHTHALMIC

## 2022-01-24 MED ORDER — LIDOCAINE HCL 3.5 % OP GEL
1.0000 | Freq: Once | OPHTHALMIC | Status: AC
  Administered 2022-01-24: 1 via OPHTHALMIC

## 2022-01-24 MED ORDER — TROPICAMIDE 1 % OP SOLN
1.0000 [drp] | OPHTHALMIC | Status: AC | PRN
  Administered 2022-01-24 (×3): 1 [drp] via OPHTHALMIC

## 2022-01-24 MED ORDER — BSS IO SOLN
INTRAOCULAR | Status: DC | PRN
  Administered 2022-01-24: 15 mL via INTRAOCULAR

## 2022-01-24 MED ORDER — SODIUM HYALURONATE 10 MG/ML IO SOLUTION
PREFILLED_SYRINGE | INTRAOCULAR | Status: DC | PRN
  Administered 2022-01-24: 0.85 mL via INTRAOCULAR

## 2022-01-24 MED ORDER — STERILE WATER FOR IRRIGATION IR SOLN
Status: DC | PRN
  Administered 2022-01-24: 250 mL

## 2022-01-24 MED ORDER — EPINEPHRINE PF 1 MG/ML IJ SOLN
INTRAOCULAR | Status: DC | PRN
  Administered 2022-01-24: 500 mL

## 2022-01-24 SURGICAL SUPPLY — 14 items
CATARACT SUITE SIGHTPATH (MISCELLANEOUS) ×2 IMPLANT
CLOTH BEACON ORANGE TIMEOUT ST (SAFETY) ×3 IMPLANT
EYE SHIELD UNIVERSAL CLEAR (GAUZE/BANDAGES/DRESSINGS) ×1 IMPLANT
FEE CATARACT SUITE SIGHTPATH (MISCELLANEOUS) ×2 IMPLANT
GLOVE BIOGEL PI IND STRL 7.0 (GLOVE) ×4 IMPLANT
GLOVE BIOGEL PI INDICATOR 7.0 (GLOVE) ×1
GLOVE ECLIPSE 6.5 STRL STRAW (GLOVE) ×1 IMPLANT
LENS IOL RAYNER 21.0 (Intraocular Lens) ×2 IMPLANT
LENS IOL RAYONE EMV 21.0 (Intraocular Lens) IMPLANT
PAD ARMBOARD 7.5X6 YLW CONV (MISCELLANEOUS) ×3 IMPLANT
SYR TB 1ML LL NO SAFETY (SYRINGE) ×3 IMPLANT
TAPE SURG TRANSPORE 1 IN (GAUZE/BANDAGES/DRESSINGS) IMPLANT
TAPE SURGICAL TRANSPORE 1 IN (GAUZE/BANDAGES/DRESSINGS) ×2
WATER STERILE IRR 250ML POUR (IV SOLUTION) ×3 IMPLANT

## 2022-01-24 NOTE — Anesthesia Preprocedure Evaluation (Signed)
Anesthesia Evaluation  Patient identified by MRN, date of birth, ID band Patient awake    Reviewed: Allergy & Precautions, NPO status , Patient's Chart, lab work & pertinent test results, reviewed documented beta blocker date and time   Airway Mallampati: Trach  TM Distance: >3 FB Neck ROM: Limited   Comment: H/o tracheostomy Dental  (+) Dental Advisory Given, Upper Dentures, Lower Dentures   Pulmonary sleep apnea (tracheostomy), Continuous Positive Airway Pressure Ventilation and Oxygen sleep apnea , pneumonia, COPD,  COPD inhaler and oxygen dependent, former smoker,  H/o tracheostomy   Pulmonary exam normal breath sounds clear to auscultation       Cardiovascular Exercise Tolerance: Poor hypertension, Pt. on medications and Pt. on home beta blockers + Past MI, +CHF and + DOE  + dysrhythmias Atrial Fibrillation  Rhythm:Irregular Rate:Bradycardia  1. Left ventricular ejection fraction, by estimation, is 45 to 50%. Left ventricular ejection fraction by PLAX is 46 %. The left ventricle has mildly decreased function. The left ventricle demonstrates global hypokinesis. There is moderate concentric left ventricular hypertrophy. Left ventricular diastolic parameters are indeterminate.  2. Right ventricular systolic function is moderately reduced. The right ventricular size is normal. There is severely elevated pulmonary artery  systolic pressure. The estimated right ventricular systolic pressure is 55.3 mmHg.  3. Tricuspid valve regurgitation is at least moderate.  4. The inferior vena cava is normal in size with <50% respiratory variability, suggesting right atrial pressure of 8 mmHg.  5. The mitral valve is abnormal. Mild to moderate mitral valve regurgitation, with blunting of systolic pulmonary vein flow. No evidence  of mitral stenosis. Severe mitral annular calcification.  6. Left atrial size was severely dilated.  7. The aortic  valve is tricuspid. There is mild calcification of the aortic valve. Aortic valve regurgitation is not visualized. No aortic stenosis is present.   Comparison(s): A prior study was performed on 11/22/20. Prior images  reviewed side by side. LV function appears slightly less vigorous with similar tricuspid regurgitation.    Neuro/Psych Seizures -, Well Controlled,  PSYCHIATRIC DISORDERS Anxiety Depression Dementia  Neuromuscular disease CVA    GI/Hepatic Neg liver ROS, hiatal hernia, GERD  Medicated and Controlled,  Endo/Other  diabetes, Well Controlled, Type 2, Insulin DependentHypothyroidism   Renal/GU Renal InsufficiencyRenal disease  negative genitourinary   Musculoskeletal  (+) Arthritis , Fibromyalgia -  Abdominal   Peds negative pediatric ROS (+)  Hematology  (+) Blood dyscrasia, anemia ,   Anesthesia Other Findings EKG - A fib, LAFB  Reproductive/Obstetrics negative OB ROS                             Anesthesia Physical  Anesthesia Plan  ASA: 3  Anesthesia Plan: MAC   Post-op Pain Management: Minimal or no pain anticipated   Induction:   PONV Risk Score and Plan: 0  Airway Management Planned: Nasal Cannula and Natural Airway  Additional Equipment:   Intra-op Plan:   Post-operative Plan:   Informed Consent: I have reviewed the patients History and Physical, chart, labs and discussed the procedure including the risks, benefits and alternatives for the proposed anesthesia with the patient or authorized representative who has indicated his/her understanding and acceptance.     Dental advisory given  Plan Discussed with: CRNA and Surgeon  Anesthesia Plan Comments:         Anesthesia Quick Evaluation

## 2022-01-24 NOTE — Discharge Instructions (Signed)
Please discharge patient when stable, will follow up today with Dr. Anjelina Dung at the [facility] office immediately following discharge.  Leave shield in place until visit.  All paperwork with discharge instructions will be given at the office.  [facility] Address:  [address]  [place], [place] [address]  

## 2022-01-24 NOTE — Op Note (Signed)
Date of procedure: 01/24/22  Pre-operative diagnosis: Visually significant age-related combined cataract, Left Eye (H25.812)  Post-operative diagnosis: Visually significant age-related combined cataract, Left Eye (H25.812)  Procedure: Removal of cataract via phacoemulsification and insertion of intra-ocular lens Rayner [id number] +21.0D into the capsular bag of the Left Eye  Attending surgeon: Gerda Diss. Cree Napoli, MD, MA  Anesthesia: MAC, Topical Akten  Complications: None  Estimated Blood Loss: <39m (minimal)  Specimens: None  Implants: As above  Indications:  Visually significant age-related cataract, Left Eye  Procedure:  The patient was seen and identified in the pre-operative area. The operative eye was identified and dilated.  The operative eye was marked.  Topical anesthesia was administered to the operative eye.     The patient was then to the operative suite and placed in the supine position.  A timeout was performed confirming the patient, procedure to be performed, and all other relevant information.   The patient's face was prepped and draped in the usual fashion for intra-ocular surgery.  A lid speculum was placed into the operative eye and the surgical microscope moved into place and focused.  An inferotemporal paracentesis was created using a 20 gauge paracentesis blade.  Shugarcaine was injected into the anterior chamber.  Viscoelastic was injected into the anterior chamber.  A temporal clear-corneal main wound incision was created using a 2.464mmicrokeratome.  A continuous curvilinear capsulorrhexis was initiated using an irrigating cystitome and completed using capsulorrhexis forceps.  Hydrodissection and hydrodeliniation were performed.  Viscoelastic was injected into the anterior chamber.  A phacoemulsification handpiece and a chopper as a second instrument were used to remove the nucleus and epinucleus. The irrigation/aspiration handpiece was used to remove any remaining  cortical material.   The capsular bag was reinflated with viscoelastic, checked, and found to be intact.  The intraocular lens was inserted into the capsular bag.  The irrigation/aspiration handpiece was used to remove any remaining viscoelastic.  The clear corneal wound and paracentesis wounds were then hydrated and checked with Weck-Cels to be watertight.  Maxitrol was instilled in the eye. The lid-speculum was removed.  The drape was removed.  The patient's face was cleaned with a wet and dry 4x4.    A clear shield was taped over the eye. The patient was taken to the post-operative care unit in good condition, having tolerated the procedure well.  Post-Op Instructions: [facility] and will receive all other orders and instructions.

## 2022-01-24 NOTE — Transfer of Care (Signed)
Immediate Anesthesia Transfer of Care Note  Patient: [name]  Procedure(s) Performed: CATARACT EXTRACTION PHACO AND INTRAOCULAR LENS PLACEMENT (IOC) (Left: Eye)  Patient Location: Short Stay  Anesthesia Type:MAC  Level of Consciousness: awake, alert  and oriented  Airway & Oxygen Therapy: Patient Spontanous Breathing  Post-op Assessment: Report given to RN and Post -op Vital signs reviewed and stable  Post vital signs: Reviewed and stable  Last Vitals:  Vitals Value Taken Time  BP    Temp    Pulse    Resp    SpO2      Last Pain:  Vitals:   01/24/22 1130  PainSc: 0-No pain         Complications: Pt on Trach Collar 2L O2.

## 2022-01-24 NOTE — Interval H&P Note (Signed)
History and Physical Interval Note:  01/24/2022 11:27 AM  [name]  has presented today for surgery, with the diagnosis of combined forms age related cataract; left.  The various methods of treatment have been discussed with the patient and family. After consideration of risks, benefits and other options for treatment, the patient has consented to  Procedure(s) with comments: CATARACT EXTRACTION PHACO AND INTRAOCULAR LENS PLACEMENT (IOC) (Left) - left as a surgical intervention.  The patient's history has been reviewed, patient examined, no change in status, stable for surgery.  I have reviewed the patient's chart and labs.  Questions were answered to the patient's satisfaction.     [name]

## 2022-01-25 ENCOUNTER — Encounter (HOSPITAL_COMMUNITY): Payer: Self-pay | Admitting: Ophthalmology

## 2022-01-25 ENCOUNTER — Other Ambulatory Visit: Payer: Self-pay | Admitting: Gastroenterology

## 2022-01-25 ENCOUNTER — Encounter (HOSPITAL_COMMUNITY): Payer: Self-pay | Admitting: Internal Medicine

## 2022-01-25 DIAGNOSIS — K580 Irritable bowel syndrome with diarrhea: Secondary | ICD-10-CM

## 2022-01-26 ENCOUNTER — Encounter (HOSPITAL_COMMUNITY): Payer: Self-pay | Admitting: Internal Medicine

## 2022-01-26 NOTE — Anesthesia Postprocedure Evaluation (Signed)
Anesthesia Post Note  Patient: [name]  Procedure(s) Performed: CATARACT EXTRACTION PHACO AND INTRAOCULAR LENS PLACEMENT (IOC) (Left: Eye)  Patient location during evaluation: Phase II Anesthesia Type: MAC Level of consciousness: awake Pain management: pain level controlled Vital Signs Assessment: post-procedure vital signs reviewed and stable Respiratory status: spontaneous breathing and respiratory function stable Cardiovascular status: blood pressure returned to baseline and stable Postop Assessment: no headache and no apparent nausea or vomiting Anesthetic complications: no Comments: Late entry   No notable events documented.   Last Vitals:  Vitals:   01/24/22 1138 01/24/22 1203  BP:  114/64  Pulse: 60 60  Resp: 15 18  Temp:  36.9 C  SpO2: 100% 100%    Last Pain:  Vitals:   01/25/22 1008  TempSrc:   PainSc: 0-No pain                 Louann Sjogren

## 2022-01-27 ENCOUNTER — Ambulatory Visit (HOSPITAL_COMMUNITY)
Admission: RE | Admit: 2022-01-27 | Discharge: 2022-01-27 | Disposition: A | Discharge status: Home / Self Care | Payer: HMO | Source: Ambulatory Visit | Attending: Gastroenterology | Admitting: Gastroenterology

## 2022-01-27 DIAGNOSIS — R932 Abnormal findings on diagnostic imaging of liver and biliary tract: Secondary | ICD-10-CM | POA: Insufficient documentation

## 2022-02-01 ENCOUNTER — Encounter (HOSPITAL_COMMUNITY): Payer: Self-pay | Admitting: Internal Medicine

## 2022-02-09 ENCOUNTER — Ambulatory Visit (HOSPITAL_COMMUNITY)
Admission: RE | Admit: 2022-02-09 | Discharge: 2022-02-09 | Disposition: A | Discharge status: Home / Self Care | Payer: HMO | Source: Ambulatory Visit | Attending: Acute Care | Admitting: Acute Care

## 2022-02-09 DIAGNOSIS — J386 Stenosis of larynx: Secondary | ICD-10-CM | POA: Diagnosis not present

## 2022-02-09 DIAGNOSIS — I509 Heart failure, unspecified: Secondary | ICD-10-CM | POA: Diagnosis not present

## 2022-02-09 DIAGNOSIS — E1122 Type 2 diabetes mellitus with diabetic chronic kidney disease: Secondary | ICD-10-CM | POA: Diagnosis not present

## 2022-02-09 DIAGNOSIS — I4891 Unspecified atrial fibrillation: Secondary | ICD-10-CM | POA: Insufficient documentation

## 2022-02-09 DIAGNOSIS — E662 Morbid (severe) obesity with alveolar hypoventilation: Secondary | ICD-10-CM | POA: Diagnosis not present

## 2022-02-09 DIAGNOSIS — N182 Chronic kidney disease, stage 2 (mild): Secondary | ICD-10-CM | POA: Diagnosis not present

## 2022-02-09 DIAGNOSIS — J9610 Chronic respiratory failure, unspecified whether with hypoxia or hypercapnia: Secondary | ICD-10-CM | POA: Insufficient documentation

## 2022-02-09 DIAGNOSIS — Z930 Tracheostomy status: Secondary | ICD-10-CM | POA: Diagnosis not present

## 2022-02-09 NOTE — Progress Notes (Signed)
Reason  for visit  Planned trach change  HPI 71 year old female patient with history of obstructive sleep apnea as well as obesity hypoventilation syndrome with need for nocturnal ventilation.  She lives at home, and her primary care provider is her daughter.  I last saw her on 1/23 at which time she was status post hospitalization after her tracheostomy had been dislodged necessitating tracheostomy revision by ENT.  She presents today for planned tracheostomy change.  Since last visit: Went to OR w/ Dr Pollyann Kennedy 5/17 underwent microlarygoscopy w/ CO2 laser and subglottic laryngeal mass bx.  Findings:  "There is a large granulomatous appearing mass completely occluding the airway just below the cords.  The seem to be arising attached to the right posterior subglottis.  This was removed in a piecemeal fashion using a large biopsy forceps.  A smaller forceps was then used to remove residual pieces...after that    There is no evidence of any subglottic stenosis.  The CO2 laser was then attached to the microscope and used at a power of 3 W continuous power and used to remove any residual granulomatous tissue along the edges posteriorly of the subglottic bilaterally." 5/31 ENT f/u  Flexible laryngoscopy "shows patent anterior nasal cavity with minimal crusting, no discharge or infection. Normal base of tongue and supraglottis Normal vocal cord mobility without vocal cord nodule, mass, polyp or tumor. There is significant edema of the bilateral true vocal folds; I am unable to visualize the subglottis. Hypopharynx normal without mass, pooling of secretions or aspiration" recommended limited PMV trials (removing if SOB)and on-going obs. 6/13 ENT f/u: no improvement on PMV tolerance. Referral made to [facility] for academic/tertiary care consultation  6/18 seen at [facility] re: subglottic stenosis and inability to phonate. Exam "Mild diffuse erythema Modest posterior laryngeal edema The vocal fold mobility is preserved but  limited. There is minimal midfold atrophy and significant subglottic edema There are no lesions on the free edge of the vocal fold, or elsewhere in the larynx worrisome for malignancy The mucosa of the post-cricoid and interarytenoid region is modestly redundant There are no secretions pooled in either pyriform sinus, and no aspiration is identified on this examination The tongue base and epiglottis are structurally normal The visualized subglottis and proximal trachea are obstructed by subglottic edema and the suggestion of a mass in the immediate proximal trachea"  Assessment: "Limited vocal fold motion and significant vocal fold edema 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  Plan: evaluate in OR 9/14 at which time they will do formal airway exam and consider intralesional kenalog and removal of any granulation tissue. Also she will follow w/ SLP there for possible electrolyarynx   Review of Systems  Constitutional: Negative.      Physical exam Physical Exam Constitutional:      General: She is not in acute distress.    Appearance: Normal appearance. She is normal weight.  HENT:     Head: Normocephalic.     Comments: Trach stoma unremarkable  Still not able to phonate     Nose: Nose normal.  Eyes:     Pupils: Pupils are equal, round, and reactive to light.  Cardiovascular:     Rate and Rhythm: Normal rate.  Pulmonary:     Effort: Pulmonary effort is normal.  Abdominal:     General: Abdomen is flat.  Musculoskeletal:  General: Normal range of motion.     Cervical back: Normal range of motion.  Skin:    General: Skin is warm.  Neurological:     General: No focal deficit present.     Mental Status: She is alert.       Trach change:  Pt pre-medicated w/ inhaled lidocaine. Trach removed. Site inspected and unremarkable. New trach  placed over obturator by RRT [place]. Placement verified via ETCO2. 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

## 2022-02-09 NOTE — Progress Notes (Addendum)
Tracheostomy Procedure Note  [name] [id number] 11/25/1950  Pre Procedure Tracheostomy Information  Trach Brand: Shiley Size:  6.0  [id number] Style: Cuffed Secured by: Velcro   Procedure: Trach change and trach sleaning Lidocaine neb treatment pre and post trach change   Post Procedure Tracheostomy Information  Trach Brand: Shiley Size:  6.0 [id number] Style: Cuffed Secured by: Velcro   Post Procedure Evaluation:  ETCO2 positive color change from yellow to purple : Yes.   Vital signs:VSS Patients current condition: stable Complications: No apparent complications Trach site exam: clean, dry Wound care done: 4 x 4 gauze  drain Patient did tolerate procedure well.   Education: none  Prescription needs: none    Additional needs: none

## 2022-02-18 ENCOUNTER — Other Ambulatory Visit (HOSPITAL_COMMUNITY): Payer: Self-pay | Admitting: Family Medicine

## 2022-02-18 ENCOUNTER — Encounter: Payer: Self-pay | Admitting: Internal Medicine

## 2022-02-18 DIAGNOSIS — M79604 Pain in right leg: Secondary | ICD-10-CM

## 2022-02-21 NOTE — Telephone Encounter (Signed)
Would you recommend if [name] would need to have the RSV vaccine?   Please advise

## 2022-02-22 ENCOUNTER — Encounter (HOSPITAL_COMMUNITY): Payer: Self-pay | Admitting: Internal Medicine

## 2022-02-24 ENCOUNTER — Ambulatory Visit (HOSPITAL_COMMUNITY)
Admission: RE | Admit: 2022-02-24 | Discharge: 2022-02-24 | Disposition: A | Discharge status: Home / Self Care | Payer: HMO | Source: Ambulatory Visit | Attending: Family Medicine | Admitting: Family Medicine

## 2022-02-24 DIAGNOSIS — M79604 Pain in right leg: Secondary | ICD-10-CM | POA: Insufficient documentation

## 2022-03-04 HISTORY — PX: ESOPHAGOGASTRODUODENOSCOPY: SHX1529

## 2022-03-24 ENCOUNTER — Encounter: Payer: Self-pay | Admitting: Cardiology

## 2022-03-24 ENCOUNTER — Ambulatory Visit: Payer: HMO | Admitting: Orthopedic Surgery

## 2022-03-24 ENCOUNTER — Ambulatory Visit: Payer: HMO | Attending: Cardiology | Admitting: Cardiology

## 2022-03-24 VITALS — BP 110/64 | HR 69 | Ht 69.0 in | Wt 176.0 lb

## 2022-03-24 DIAGNOSIS — I4891 Unspecified atrial fibrillation: Secondary | ICD-10-CM | POA: Diagnosis not present

## 2022-03-24 DIAGNOSIS — I5032 Chronic diastolic (congestive) heart failure: Secondary | ICD-10-CM

## 2022-03-24 NOTE — Progress Notes (Signed)
Virtual Visit via Telephone Note   Because of [name] co-morbid illnesses, she is at least at moderate risk for complications without adequate follow up.  This format is felt to be most appropriate for this patient at this time.  The patient did not have access to video technology/had technical difficulties with video requiring transitioning to audio format only (telephone).  All issues noted in this document were discussed and addressed.  No physical exam could be performed with this format.  Please refer to the patient's chart for her consent to telehealth for [facility].    Date:  03/24/2022   ID:  Tara, [name] [birth year], MRN [id number] The patient was identified using 2 identifiers.  Patient Location: Home Provider Location: Office/Clinic   PCP:  Gareth Morgan, MD   [facility] Providers Cardiologist:  Dina Rich, MD     Evaluation Performed:  Follow-Up Visit  Chief Complaint:  Follow up  History of Present Illness:    [name] is a 71 y.o. female seen today for follow up of the following medical problems.    Patient with chronic tracheostomy, history is provided by her daughter   1. Chronic systolic diastolic CHF with mixed precap and post postcap Pulmonary Hypertension - echo 12/2013 PASP 85, moderate TR, RV mild to moderately dilated with normal function, could not eval diasotlic function due to afib, LVEF 50-55%. Biatrial enlargement.   - RHC 01/2015 PA 38/13 and calculated mean of 21, could not get a wedge however LVEDP 15. - repeat echo 10/2014 PASP 61 mmHg (down from 85 on prior echo), some evidence of RV dysfunction with RV TAPSE 1.4 and tissue anular systolic velocity of 7.5. Cannot evaluate diastolic function, however severe biatrial enlarement suggests significant dysfunction, - 01/2015 RHC mean PA 52, PCWP 31 - 01/2015 echo LVEF 55-60%, PASP 46   - after most recent RHC in 01/2015 she was admitted for diuresis. Discharge  weight 170 lbs, reportedly down from 190 on admission. Marland Kitchen Discharged on lasix 60mg  bid.          - was changed from torsemide to lasix furing 09/2020 admission - no recent SOB/DOE -  denies any recent edema. Home weights stable 217 lbs which is her baseline. - in Feb very labile LV function likely transient stress induced CM   08/17/2020 echo [facility] LVEF 15-20% 08/20/2020 30 [facility]% 08/29/2020 [facility] LVEF 50-55%  -01/2021 45-50%     - no recent edema. Weight down to 176 lbs from 182 from 09/2021, overall stable over the last several weeks - takes lasix 40mg  daily     2. Afib  - amio started 02/2015.  - has had severe diffusion dfect on PFTs 04/2015 prior     - no recent palpitations.   - 02/2021 normal LFTs, TSH 01/2021 was normal  - no recent palpitations - no bleeding on eliquis          3/Chronic resp failure - has trach, O2 collar during the day and vent at night   Past Medical History:  Diagnosis Date   Abnormal pulmonary function test    Anemia    H/H of 10/30 with a normal MCV in 12/09   Anxiety    Arthritis    Barrett's esophagus    Diagnosed 1995. Last EGD 2016-NO BARRETT'S.    Chest pain    a. 2022 Cath: nl cors; b. 2008 Neg myoview; c. 01/2014 Cath: Nl cors. EF 40%; c. 08/2020  NSTEMI/Cath [facility]): nl cors.   Chronic anticoagulation    Chronic combined systolic and diastolic CHF (congestive heart failure) (HCC)    a. 10/2014 Echo: EF40%; b. 01/2015 Echo: EF 55-60%; c. 11/2020 Echo: EF 55-60%, mod LVH, sev BAE, mild MR, mod TR. PASP ; d. 01/2021 Echo: EF 45-50%, glob HK, mod conc LVH, mod reduced RV fxn, RVSP 60.49mmHg, Mod TR, mild to mod MR, sev dil LA.   Chronic LBP    Surgical intervention in 1996   Diabetes mellitus, type 2 (HCC)    Insulin therapy; exacerbated by prednisone   Gastroparesis    99% retention 05/2008 on GES   GERD (gastroesophageal reflux disease)    Heart attack (HCC) 08/13/2020   Hiatal hernia    Hyperlipidemia    Hypertension     Hypothyroid    IBS (irritable bowel syndrome)    Obesity    OSA on CPAP    had CPAP and cannot tolerate.   Paroxysmal atrial fibrillation (HCC)    a.CHA2DS2VASc = 6-->eliquis. Also on Amio.   Pulmonary hypertension (HCC) 01/2015   a. 01/2015 RHC: Predominantly pulmonary venous hypertension but may be component of PAH (PA mean 52, PCWP 31); b. 01/2021 Echo: RVSP 60.23mmHg w/ mod reduced RV fxn.   Seizures (HCC)    last seizure was 2 years ago; on keppra for this; unknown etiology   Syncope    a. Admitted 05/2009; magnetic resonance imagin/ MRA - negative; etiology thought to be orthostasis secondary to drugs and dehydration. b. Syncope 02/2015 also felt 2/2 dehydration.   Past Surgical History:  Procedure Laterality Date   BACK SURGERY  1996   BIOPSY N/A 11/08/2013   Procedure: BIOPSY  / Tissue sampling / ulcers present in small intestine;  Surgeon: West Bali, MD;  [facility]: [facility]O [facility];  Service: Endoscopy;  Laterality: N/A;   CARDIAC CATHETERIZATION  2002   CARDIAC CATHETERIZATION N/A 01/26/2015   Procedure: Right Heart Cath;  Surgeon: Laurey Morale, MD;  Location: [facility] INVASIVE CV LAB;  Service: Cardiovascular;  Laterality: N/A;   CARDIOVASCULAR STRESS TEST  2008   Stress nuclear study   CARDIOVERSION N/A 03/06/2015   Procedure: CARDIOVERSION;  Surgeon: Laurey Morale, MD;  Location: [facility] ENDOSCOPY;  Service: Cardiovascular;  Laterality: N/A;   CARPAL TUNNEL RELEASE  1994   CATARACT EXTRACTION W/PHACO Right 01/10/2022   Procedure: CATARACT EXTRACTION PHACO AND INTRAOCULAR LENS PLACEMENT (IOC);  Surgeon: [name], MD;  Location: [facility] ORS;  Service: Ophthalmology;  Laterality: Right;  CDE: 21.51   CATARACT EXTRACTION W/PHACO Left 01/24/2022   Procedure: CATARACT EXTRACTION PHACO AND INTRAOCULAR LENS PLACEMENT (IOC);  Surgeon: [name], MD;  Location: [facility] ORS;  Service: Ophthalmology;  Laterality: Left;  CDE: 17.13   COLONOSCOPY  11/2011   Dr. Darrick Penna: Internal hemorrhoids,  mild diverticulosis. Random colon biopsies negative.   COLONOSCOPY N/A 11/08/2013   SLF: Normal mucosa in the terminal ileum/The colon IS redundant/  Moderate diverticulosis throughout the entire colon. ileum bx benign. colon bx benign   CRYOTHERAPY  01/23/2021   Procedure: CRYOTHERAPY;  Surgeon: Lorin Glass, MD;  Location: [facility] ENDOSCOPY;  Service: Pulmonary;;   CRYOTHERAPY  01/24/2021   Procedure: Cloyde Reams;  Surgeon: Lorin Glass, MD;  Location: [facility] ENDOSCOPY;  Service: Pulmonary;;   CRYOTHERAPY  01/26/2021   Procedure: Cloyde Reams;  Surgeon: Lupita Leash, MD;  Location: [facility] ENDOSCOPY;  Service: Cardiopulmonary;;   ESOPHAGOGASTRODUODENOSCOPY  2008   Barrett's without dysplasia. esphagus dilated. antral erosions, h.pylori serologies  negative.   ESOPHAGOGASTRODUODENOSCOPY  11/2011   Dr. Darrick Penna: Barrett's esophagus, mild gastritis, diverticulum in the second portion of the duodenum repeat EGD 3 years. Small bowel biopsies negative. Gastric biopsy show reactive gastropathy but no H. pylori. Esophageal biopsies consistent with GERD. Next EGD 11/2014   ESOPHAGOGASTRODUODENOSCOPY N/A 11/21/2014   ZOX:WRUE non-erosive gastritis/irregular z-line   ESOPHAGOGASTRODUODENOSCOPY  03/2022   FOREIGN BODY REMOVAL  01/23/2021   Procedure: FOREIGN BODY REMOVAL;  Surgeon: Lorin Glass, MD;  Location: [facility] ENDOSCOPY;  Service: Pulmonary;;   FOREIGN BODY REMOVAL  01/24/2021   Procedure: FOREIGN BODY REMOVAL;  Surgeon: Lorin Glass, MD;  Location: [facility] ENDOSCOPY;  Service: Pulmonary;;   GIVENS CAPSULE STUDY  12/07/2011   Proximal small bowel, rare AVM. Distal small bowel, multiple ulcers noted   GIVENS CAPSULE STUDY N/A 09/27/2013   Distal small bowel ulcers extending to TI.   GIVENS CAPSULE STUDY N/A 10/10/2013   Procedure: GIVENS CAPSULE STUDY;  Surgeon: West Bali, MD;  [facility];  Service: Endoscopy;  Laterality: N/A;  7:30   HEMOSTASIS CONTROL  01/24/2021   Procedure:  HEMOSTASIS CONTROL;  Surgeon: Lorin Glass, MD;  Location: [facility] ENDOSCOPY;  Service: Pulmonary;;   HEMOSTASIS CONTROL  01/26/2021   Procedure: HEMOSTASIS CONTROL;  Surgeon: Lupita Leash, MD;  Location: [facility] ENDOSCOPY;  Service: Cardiopulmonary;;   IR GASTROSTOMY TUBE MOD SED  01/11/2021   IR GASTROSTOMY TUBE REMOVAL  04/13/2021   IR KYPHO THORACIC WITH BONE BIOPSY  02/09/2018   KNEE ARTHROSCOPY WITH MEDIAL MENISECTOMY Right 06/09/2016   Procedure: KNEE ARTHROSCOPY WITH MEDIAL MENISECTOMY;  Surgeon: Vickki Hearing, MD;  Location: [facility] ORS;  Service: Orthopedics;  Laterality: Right;  medial and lateral menisectomy   LAMINECTOMY  1995   L4-L5   LAPAROSCOPIC CHOLECYSTECTOMY  1990s   LEFT HEART CATHETERIZATION WITH CORONARY ANGIOGRAM  01/10/2014   Procedure: LEFT HEART CATHETERIZATION WITH CORONARY ANGIOGRAM;  Surgeon: Lesleigh Noe, MD;  Location: [facility] CATH LAB;  Service: Cardiovascular;;   MICROLARYNGOSCOPY WITH LASER AND BALLOON DILATION N/A 11/17/2021   Procedure: MICROLARYNGOSCOPY WITH CO2 LASER, SUBGLOTTIC LARYNGEAL MASS BIOSPY;  Surgeon: Serena Colonel, MD;  Location: [facility] OR;  Service: ENT;  Laterality: N/A;   RIGHT HEART CATHETERIZATION N/A 01/10/2014   Procedure: RIGHT HEART CATH;  Surgeon: Lesleigh Noe, MD;  Location: [facility] CATH LAB;  Service: Cardiovascular;  Laterality: N/A;   TOTAL ABDOMINAL HYSTERECTOMY  1999   TRACHEOSTOMY REVISION N/A 07/18/2021   Procedure: TRACHEOSTOMY REVISION;  Surgeon: Suzanna Obey, MD;  Location: [facility] OR;  Service: ENT;  Laterality: N/A;   TRACHEOSTOMY TUBE PLACEMENT N/A 09/01/2017   Procedure: TRACHEOSTOMY;  Surgeon: Drema Halon, MD;  Location: [facility] OR;  Service: ENT;  Laterality: N/A;   VIDEO BRONCHOSCOPY Bilateral 01/23/2021   Procedure: VIDEO BRONCHOSCOPY WITHOUT FLUORO;  Surgeon: Lorin Glass, MD;  Location: [facility] ENDOSCOPY;  Service: Pulmonary;  Laterality: Bilateral;   VIDEO BRONCHOSCOPY Bilateral 01/24/2021   Procedure: VIDEO  BRONCHOSCOPY WITHOUT FLUORO;  Surgeon: Lorin Glass, MD;  Location: [facility] ENDOSCOPY;  Service: Pulmonary;  Laterality: Bilateral;   VIDEO BRONCHOSCOPY N/A 01/26/2021   Procedure: VIDEO BRONCHOSCOPY WITHOUT FLUORO;  Surgeon: Lupita Leash, MD;  Location: [facility] ENDOSCOPY;  Service: Cardiopulmonary;  Laterality: N/A;     Current Meds  Medication Sig   amiodarone (PACERONE) 200 MG tablet Take 1 tablet (200 mg total) by mouth daily.   apixaban (ELIQUIS) 5 MG TABS tablet Take 1 tablet (5 mg total) by  mouth 2 (two) times daily.   atorvastatin (LIPITOR) 80 MG tablet Take 1 tablet (80 mg total) by mouth daily.   Cholecalciferol (VITAMIN D3) 50 MCG (2000 UT) capsule Take 2,000 Units by mouth daily.   dicyclomine (BENTYL) 10 MG capsule Take 1 capsule (10 mg total) by mouth up to 3 times daily before meals and at bedtime as needed.  Hold for constipation.   donepezil (ARICEPT) 5 MG tablet Take 1 tablet (5 mg total) by mouth at bedtime.   DULoxetine (CYMBALTA) 30 MG capsule Take 1 capsule (30 mg total) by mouth daily. (Patient taking differently: Take 30 mg by mouth 2 (two) times daily.)   furosemide (LASIX) 40 MG tablet Take 1 tablet (40 mg total) by mouth daily.   gabapentin (NEURONTIN) 100 MG capsule Take 1 capsule (100 mg total) by mouth 3 (three) times daily.   insulin glargine (LANTUS) 100 UNIT/ML injection Inject 0.15 mLs (15 Units total) into the skin daily. (Patient taking differently: Inject 20-24 Units into the skin at bedtime.)   ipratropium-albuterol (DUONEB) 0.5-2.5 (3) MG/3ML SOLN Take 3 mLs by nebulization every 4 (four) hours as needed (Asthma).   levETIRAcetam (KEPPRA) 500 MG tablet Take 500 mg by mouth 2 (two) times daily.   levothyroxine (SYNTHROID) 175 MCG tablet Take 1 tablet (175 mcg total) by mouth daily at 6 (six) AM.   LORazepam (ATIVAN) 1 MG tablet Take 1 mg by mouth at bedtime.   melatonin 3 MG TABS tablet Take 1 tablet (3 mg total) by mouth at bedtime as needed.   metoprolol  tartrate (LOPRESSOR) 25 MG tablet Take 1 tablet (25 mg total) by mouth 2 (two) times daily.   Multiple Vitamins-Minerals (MULTIVITAMIN WITH MINERALS) tablet Take 1 tablet by mouth daily. For Diabetics   NOVOLIN R FLEXPEN RELION 100 UNIT/ML FlexPen Inject 2-5 Units into the skin 3 (three) times daily before meals. Per sliding scale   pantoprazole (PROTONIX) 40 MG tablet Take 1 tablet (40 mg total) by mouth 2 (two) times daily before a meal.   potassium chloride (KLOR-CON) 10 MEQ tablet Take 20 mEq by mouth daily.   sertraline (ZOLOFT) 25 MG tablet Take 1 tablet (25 mg total) by mouth daily.   trolamine salicylate (ASPERCREME) 10 % cream Apply 1 application. topically as needed (knee pain).   vitamin B-12 (CYANOCOBALAMIN) 1000 MCG tablet Take 1,000 mcg by mouth daily.     Allergies:   Celexa [citalopram hydrobromide], Codeine, Dilaudid [hydromorphone hcl], Reglan [metoclopramide], and Hyoscyamine   Social History   Tobacco Use   Smoking status: Former    Packs/day: 0.25    Years: 15.00    Total pack years: 3.75    Types: Cigarettes    Start date: 02/26/1966    Quit date: 07/01/1983    Years since quitting: 38.7   Smokeless tobacco: Never   Tobacco comments:    quit in 1984  Vaping Use   Vaping Use: Never used  Substance Use Topics   Alcohol use: Not Currently    Comment: Occasional alcohol 30 years ago (09/02/21)   Drug use: No     Family Hx: The patient's family history includes Alzheimer's disease in her mother; Breast cancer in her sister; Heart attack in her mother; Hypertension in her mother and another family member; Stroke in her mother. There is no history of Heart disease, Colon cancer, or Liver disease.  ROS:   Please see the history of present illness.     All other systems reviewed and  are negative.   Prior CV studies:   The following studies were reviewed today:  08/2020 cath Findings:  1. No significant coronary artery disease.  2. Elevated right and left  ventricular filling pressures (LVEDP = 27 mm  Hg).  3. Severe pulmonary hypertension      08/2020 echo Summary    1. Echo contrast utilized to enhance endocardial border definition.    2. The left ventricle is normal in size with mildly increased wall  thickness.    3. The left ventricular systolic function is severely decreased, LVEF is  visually estimated at 15-20%.    4. The left atrium is moderately dilated in size.    5. There is moderate tricuspid regurgitation.    6. There is severe pulmonary hypertension, estimated pulmonary artery  systolic pressure is 90 mmHg.    7. The right atrium is mildly to moderately dilated  in size.    8. IVC size and inspiratory change suggest mildly elevated right atrial  pressure. (5-10 mmHg).      08/2020 Summary    1. The left ventricle is upper normal in size with mildly increased wall  thickness.    2. The left ventricular systolic function is severely decreased, LVEF is  visually estimated at 30%.    3. There is severe hypokinesis of the apical left ventricular segments with  normal contractile function of the basilar segments, consistent with a stress  cardiomyopathy.    4. Mitral annular calcification is present (severe).    5. The mitral valve leaflets are mildly thickened with normal leaflet  mobility.    6. There is mild mitral valve regurgitation.    7. The aortic valve is probably trileaflet with mildly thickened leaflets  with normal excursion.    8. The left atrium is moderately dilated in size.    9. The right ventricle is upper normal in size, with normal systolic  function.    10. The right atrium is mildly dilated  in size.      Labs/Other Tests and Data Reviewed:    EKG:  No ECG reviewed.  Recent Labs: 07/18/2021: Magnesium 2.0 11/17/2021: BUN 18; Creatinine, Ser 1.00; Hemoglobin 9.7; Platelets 198; Potassium 3.8; Sodium 140   Recent Lipid Panel Lab Results  Component Value Date/Time   CHOL 149 08/29/2020  04:16 AM   TRIG 105 01/27/2021 03:52 AM   HDL 38 (L) 08/29/2020 04:16 AM   CHOLHDL 3.9 08/29/2020 04:16 AM   LDLCALC 88 08/29/2020 04:16 AM    Wt Readings from Last 3 Encounters:  03/24/22 176 lb (79.8 kg)  01/10/22 179 lb 14.3 oz (81.6 kg)  11/17/21 180 lb (81.6 kg)     Risk Assessment/Calculations:          Objective:    Vital Signs:  BP 110/64   Pulse 69   Ht 5\' 9"  (1.753 m)   Wt 176 lb (79.8 kg)   SpO2 98%   BMI 25.99 kg/m    Patient on trach collar, history is provided by daughter.   ASSESSMENT & PLAN:    Assessment and Plan  1. Chronic diastolic heart failure -from 08/2020 admissions to [facility] and [facility] transient severe LV dysfunction by [facility] echos, by her admission later in the month at [facility] LVEF had normalized - no recent issues with edema, has done well on lasix 40mg  daily. Weights are stable - continue current meds, request labs from pcp   2. Afib/acquired thrombophilia - no synmptoms, continue curren  tmeds            Time:   Today, I have spent 24 minutes with the patient with telehealth technology discussing the above problems.     Medication Adjustments/Labs and Tests Ordered: Current medicines are reviewed at length with the patient today.  Concerns regarding medicines are outlined above.   Tests Ordered: No orders of the defined types were placed in this encounter.   Medication Changes: No orders of the defined types were placed in this encounter.   Follow Up:  In Person 6 months  Signed, Dina Rich, MD  03/24/2022 1:04 PM    [facility]

## 2022-03-24 NOTE — Patient Instructions (Signed)
Medication Instructions:  Your physician recommends that you continue on your current medications as directed. Please refer to the Current Medication list given to you today.   Labwork: None  Testing/Procedures: None  Follow-Up: Follow up with Dr. Branch in 6 months.   Any Other Special Instructions Will Be Listed Below (If Applicable).     If you need a refill on your cardiac medications before your next appointment, please call your pharmacy.  

## 2022-04-02 IMAGING — DX DG CHEST 1V PORT
1 series · 1 of 1 positions shown · non-contrast
Comparison: 08/31/2018

CLINICAL DATA: Confusion, altered mental status

EXAM:
PORTABLE CHEST 1 VIEW

[chest ap]
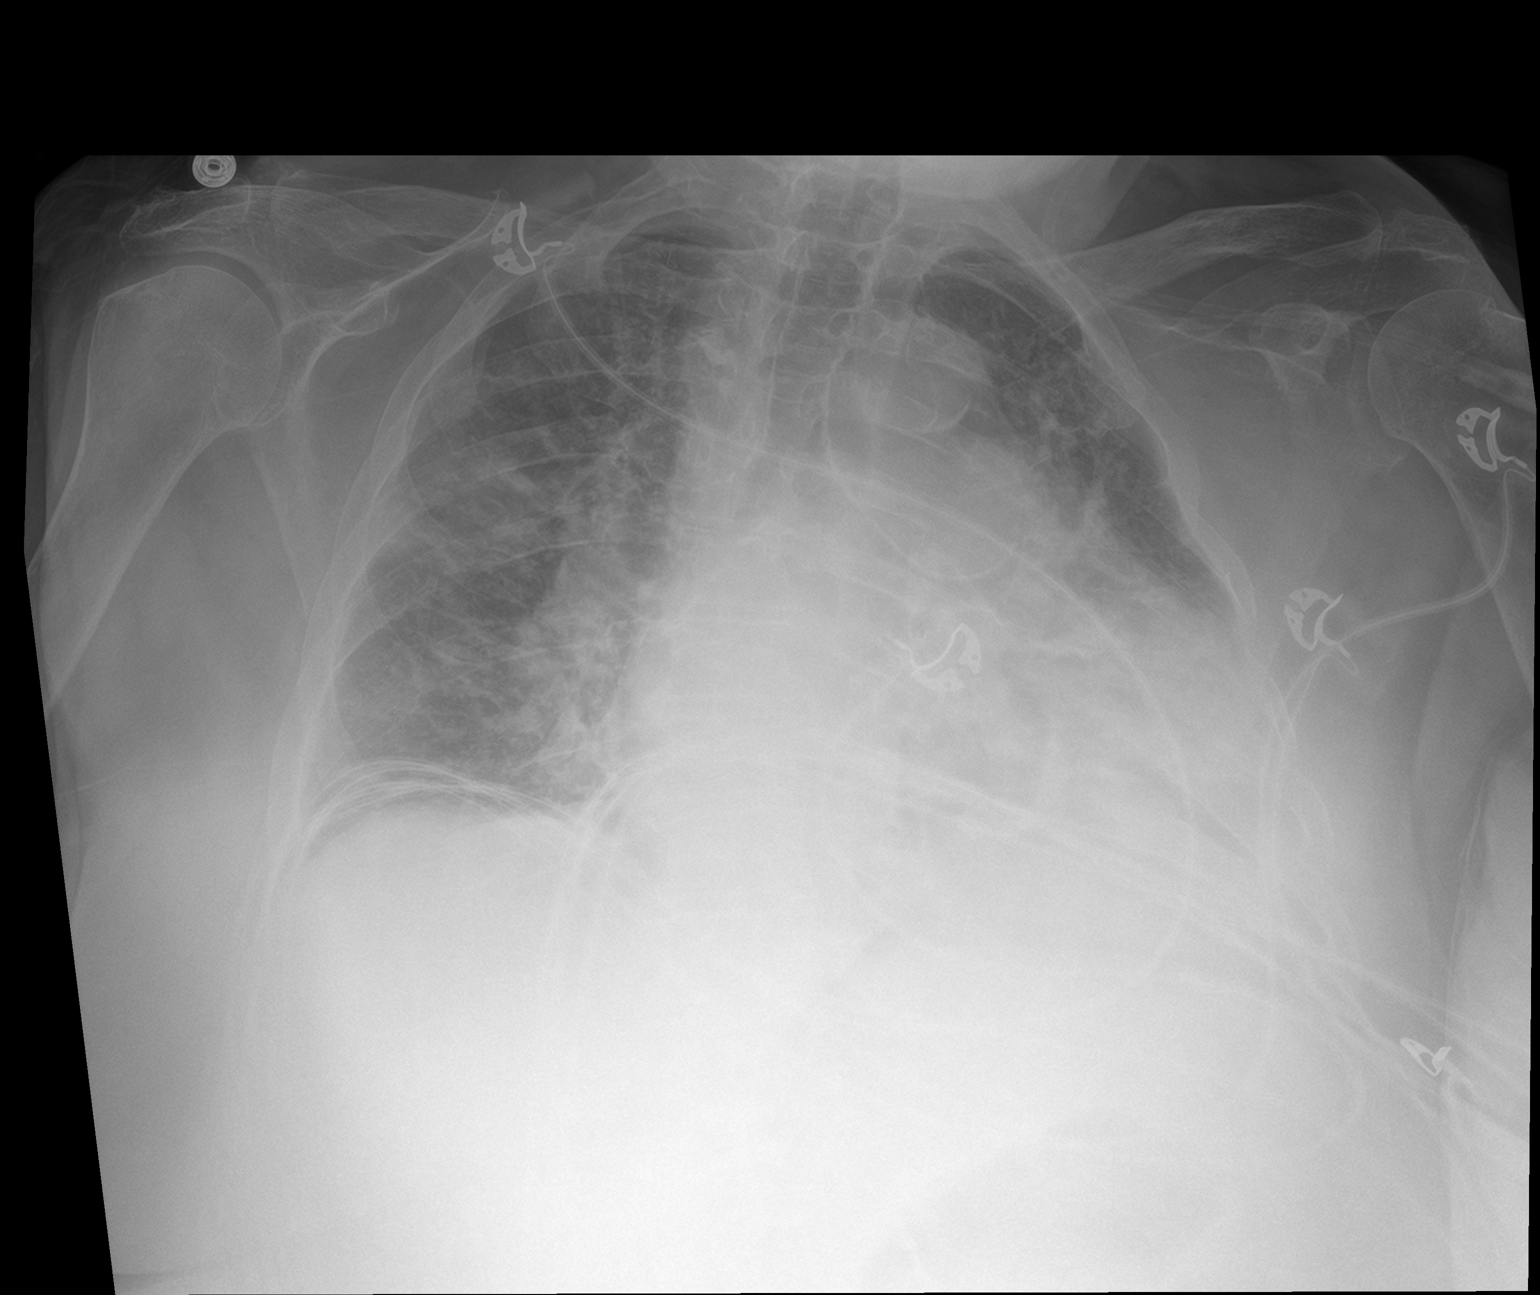

[1 of 1 positions shown; findings below may reference images not displayed]

FINDINGS: Cardiomegaly with increased vascular congestion/mild interstitial
edema pattern. Slightly lower lung volumes. Left mid and lower lung
airspace opacity obscures the left cardiac border and left
hemidiaphragm. No large effusion or pneumothorax. Aorta
atherosclerotic.

Degenerative changes of the spine
IMPRESSION: Cardiomegaly with increased vascular congestion versus developing
edema

Left mid and lower lung airspace opacity/consolidation. Difficult to
exclude pneumonia.

## 2022-04-02 IMAGING — CT CT HEAD W/O CM
3 series · 16 of 47 positions shown, 19 images · non-contrast
Comparison: 12/25/2015

CLINICAL DATA: Altered mental status and weakness

EXAM:
CT HEAD WITHOUT CONTRAST
TECHNIQUE: Contiguous axial images were obtained from the base of the skull
through the vertex without intravenous contrast.

[Series 2: head w o · axial · 0.49mm/px · z∈[+37,+167]mm · 10 of 32 slices shown, 13 images]
[im 3/32  brain]
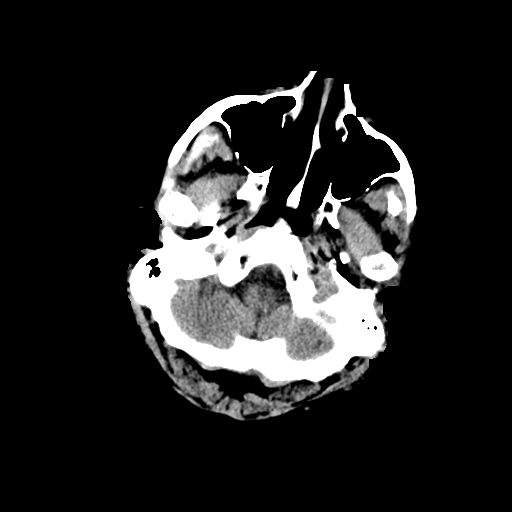
[im 3/32  bone]
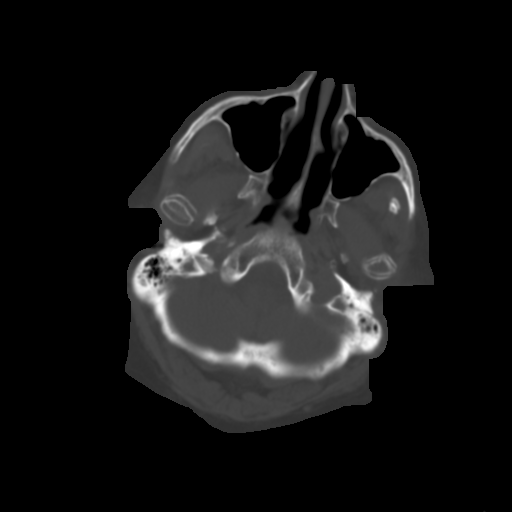
[im 6/32  brain]
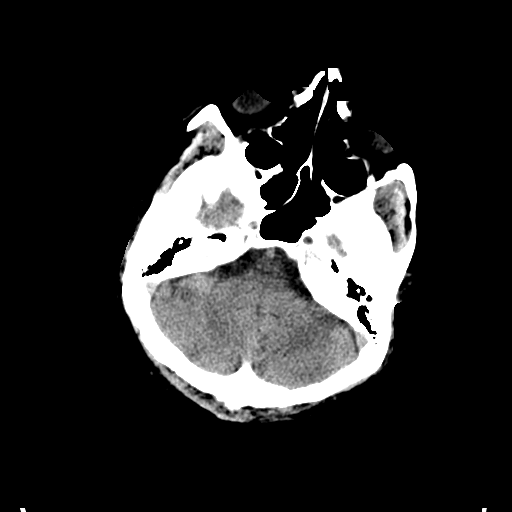
[im 9/32  brain]
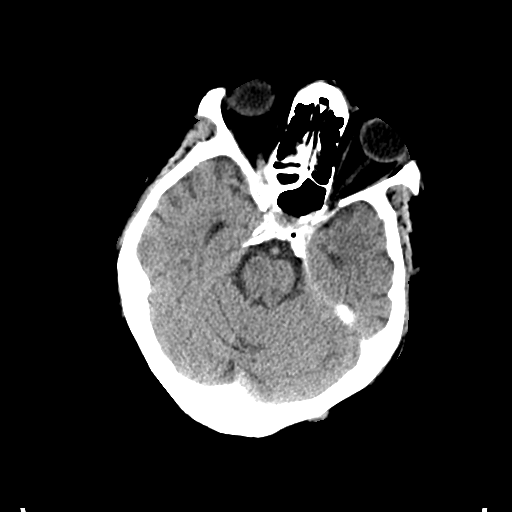
[im 11/32  brain]
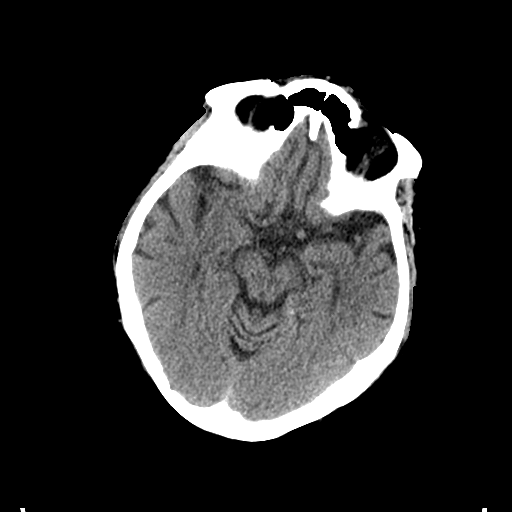
[im 14/32  brain]
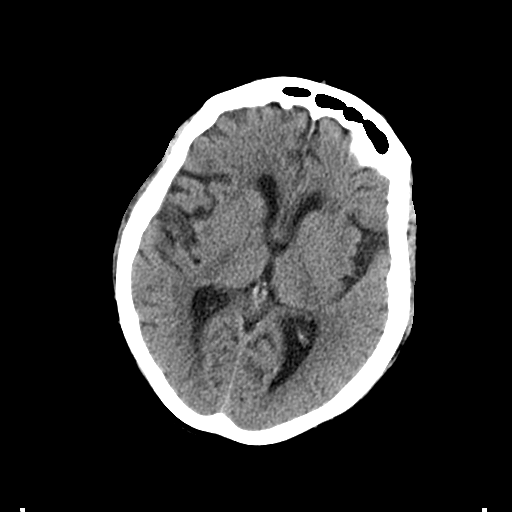
[im 14/32  bone]
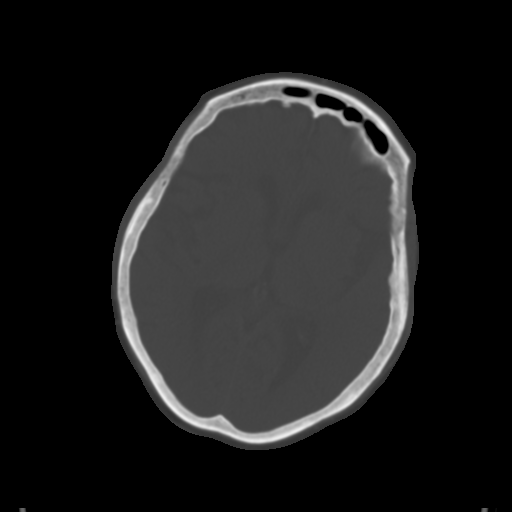
[im 18/32  brain]
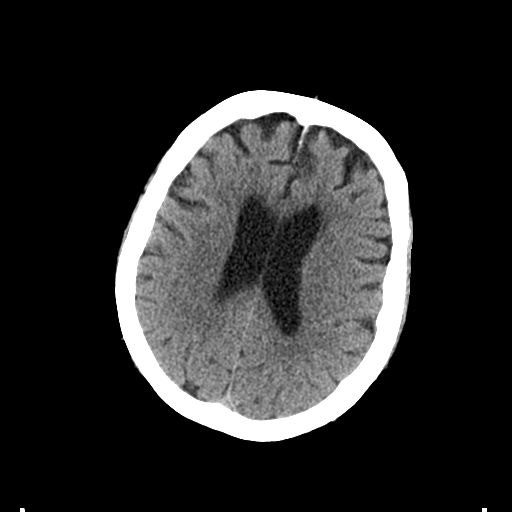
[im 21/32  brain]
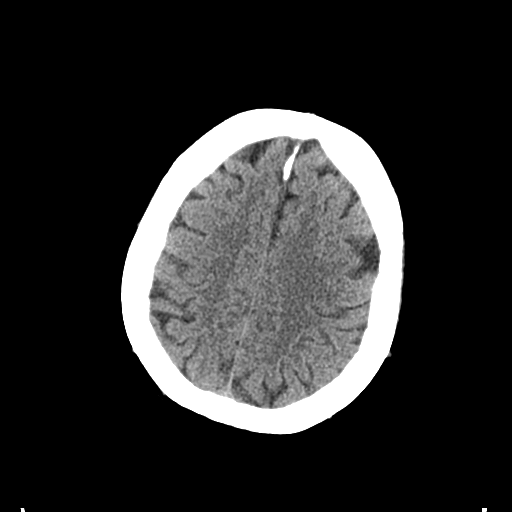
[im 24/32  brain]
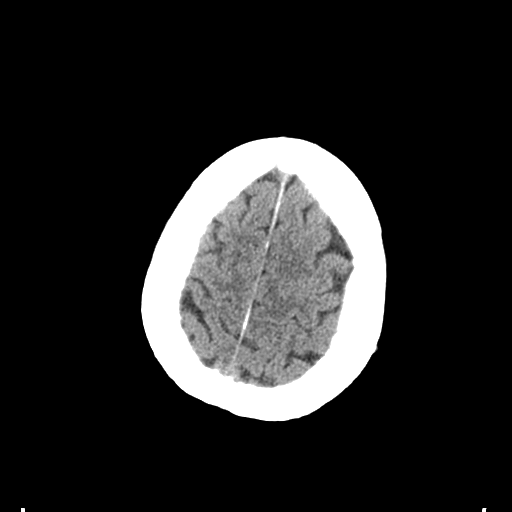
[im 26/32  brain]
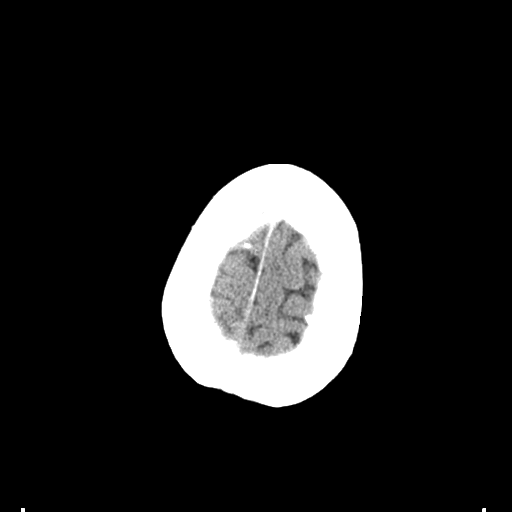
[im 26/32  bone]
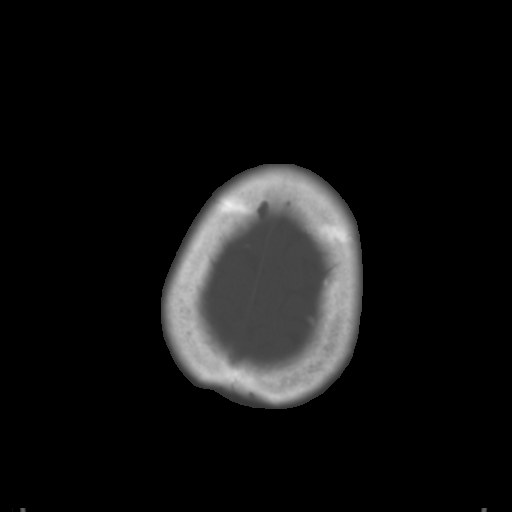
[im 29/32  brain]
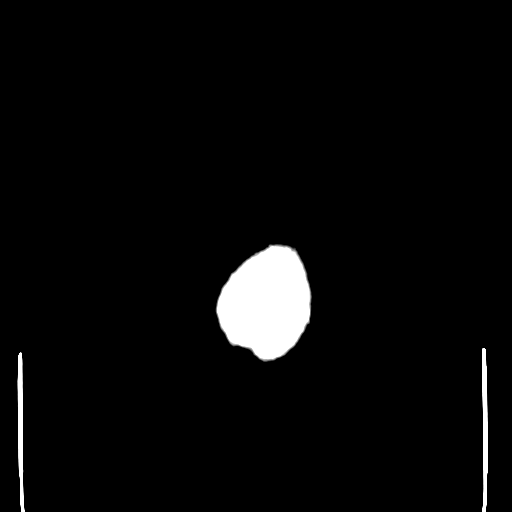

[Series 4: coronal soft · coronal · 0.38mm/px · 3 of 75 slices shown]
[im 25/75  brain]
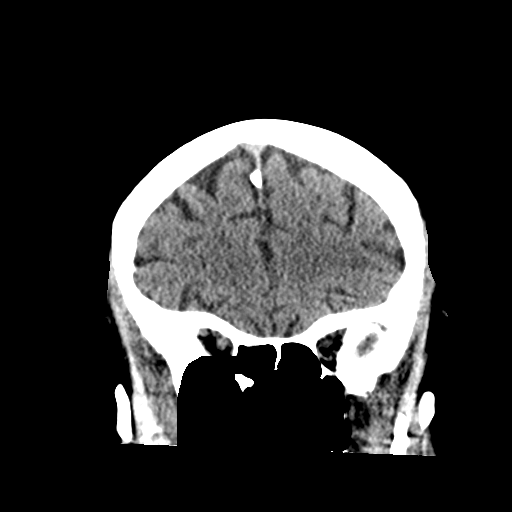
[im 33/75  brain]
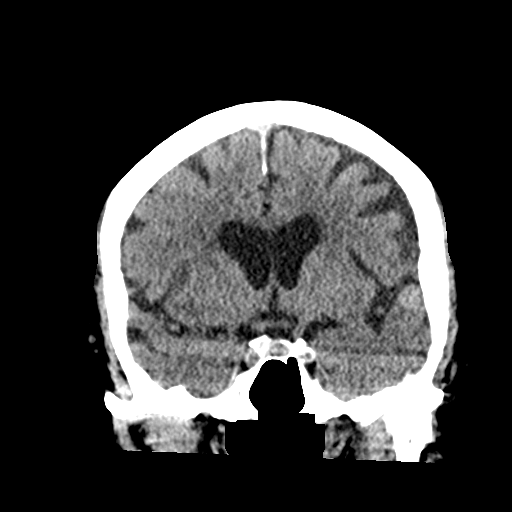
[im 42/75  brain]
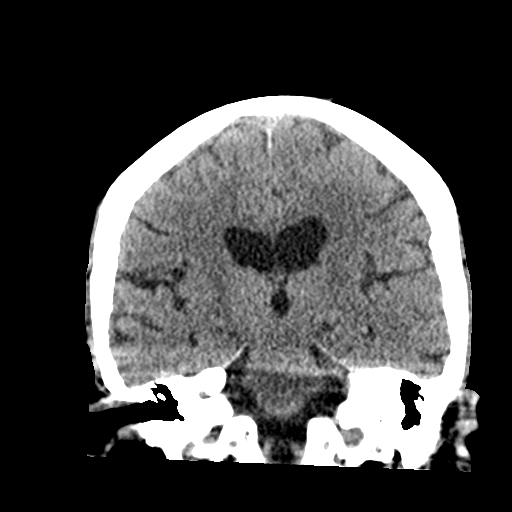

[Series 5: sagittal soft · sagittal · 0.36mm/px · 3 of 59 slices shown]
[im 20/59  brain]
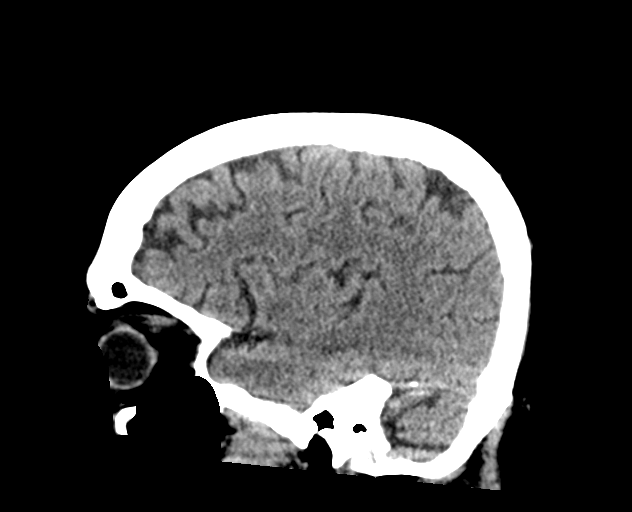
[im 30/59  brain]
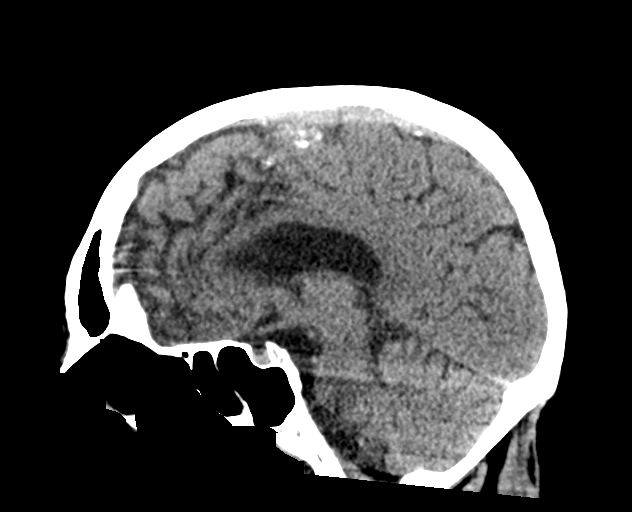
[im 39/59  brain]
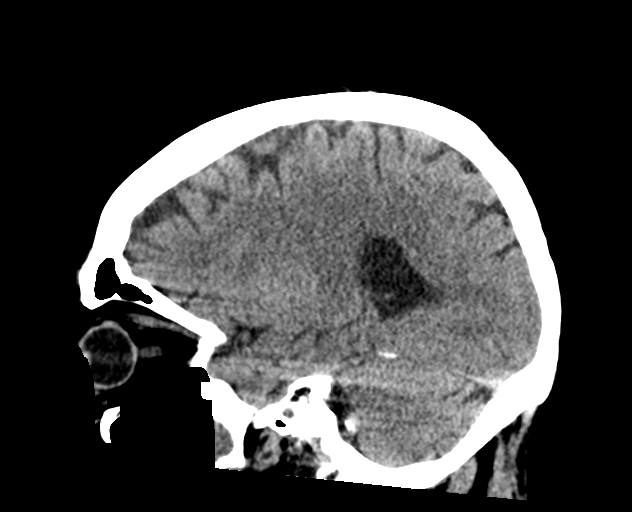

[16 of 47 positions shown; findings below may reference images not displayed]

FINDINGS: Brain: No evidence of acute infarction, hemorrhage, hydrocephalus,
extra-axial collection or mass lesion/mass effect. Mild atrophic and
chronic white matter ischemic changes are noted.

Vascular: No hyperdense vessel or unexpected calcification.

Skull: Normal. Negative for fracture or focal lesion.

Sinuses/Orbits: No acute finding.

Other: None.
IMPRESSION: Chronic atrophic and ischemic changes without acute abnormality.

## 2022-04-02 IMAGING — CT CT CHEST W/O CM
2 of 4 series · 15 of 36 positions shown, 18 images · non-contrast
Comparison: 11/26/2017

CLINICAL DATA: Pneumonia. Altered level of consciousness and
weakness.

EXAM:
CT CHEST WITHOUT CONTRAST
TECHNIQUE: Multidetector CT imaging of the chest was performed following the
standard protocol without IV contrast.

[Series 2: routine chest without · axial · non-contrast · 0.64mm/px · z∈[+1221,+1457]mm · 12 of 140 slices shown, 15 images]
[im 11/140  mediastinal]
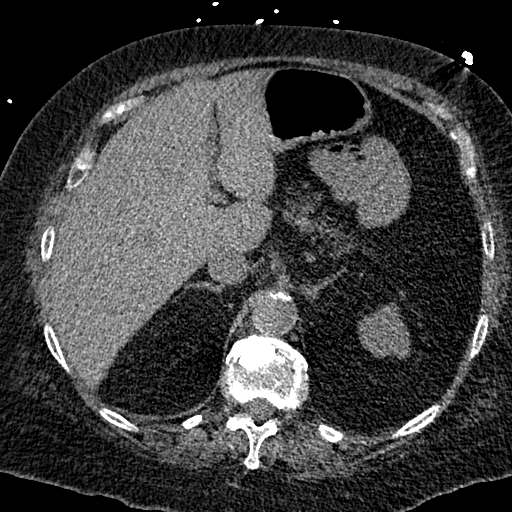
[im 11/140  lung]
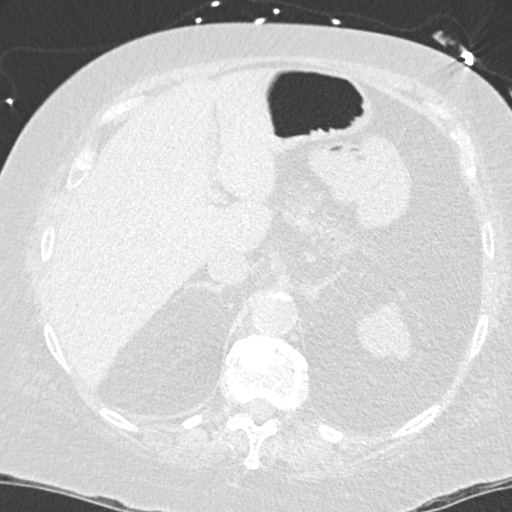
[im 22/140  lung]
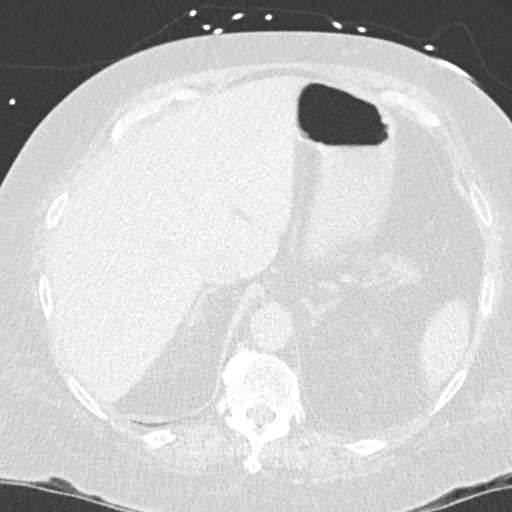
[im 33/140  lung]
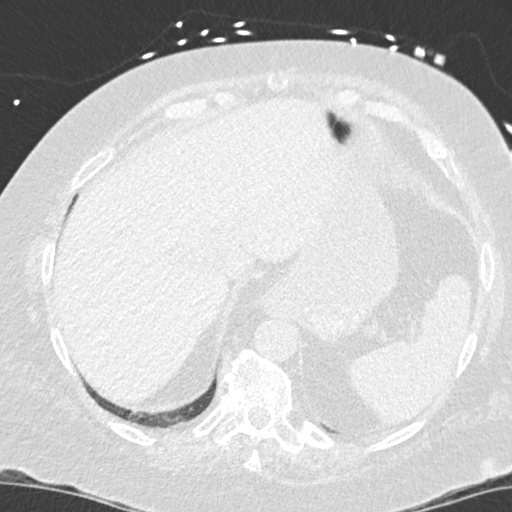
[im 43/140  lung]
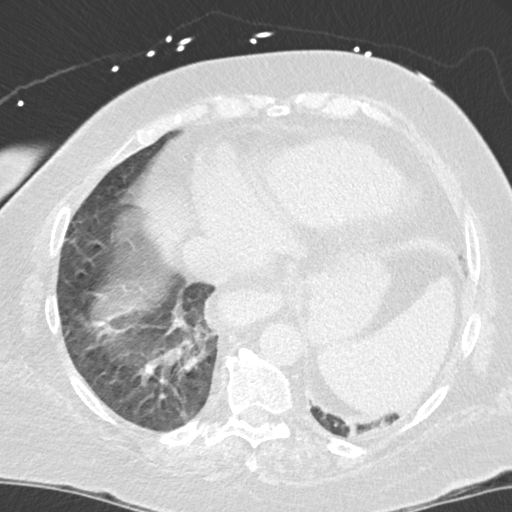
[im 54/140  mediastinal]
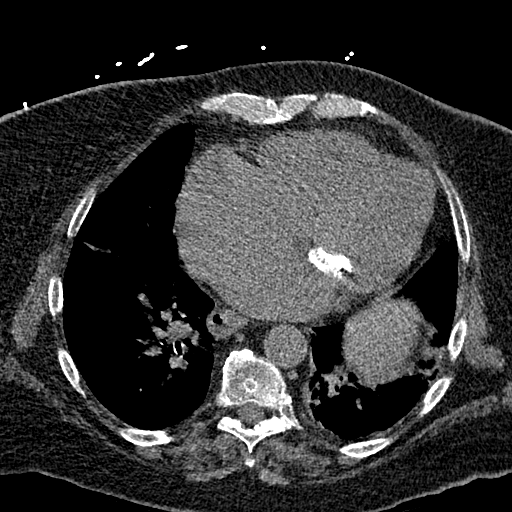
[im 54/140  lung]
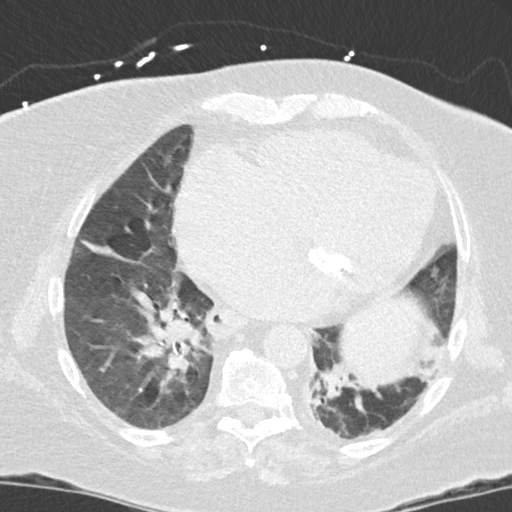
[im 65/140  lung]
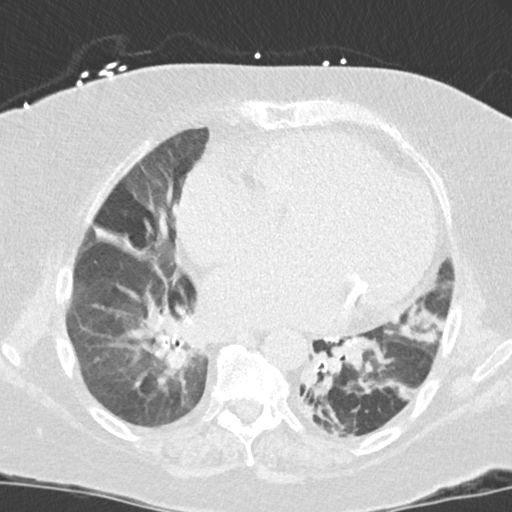
[im 75/140  lung]
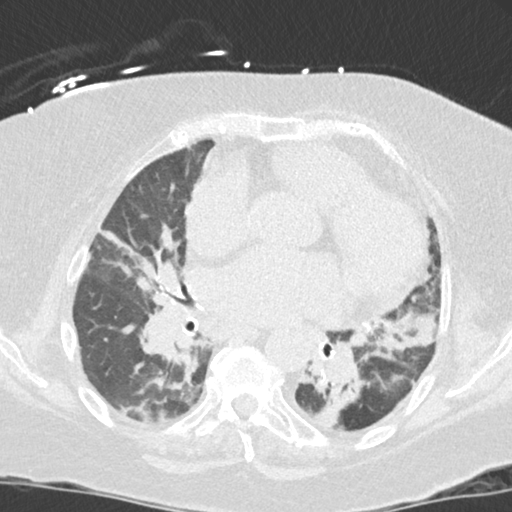
[im 86/140  lung]
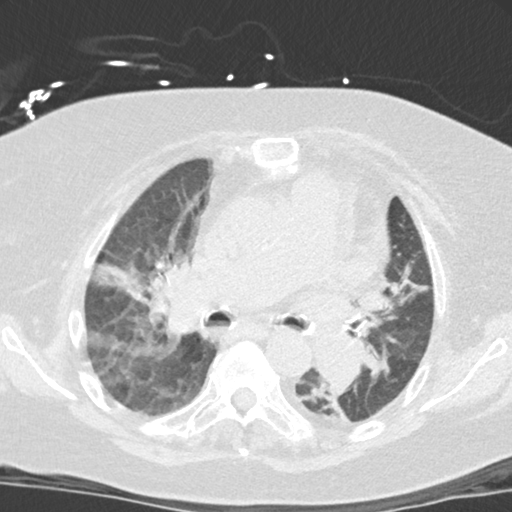
[im 97/140  mediastinal]
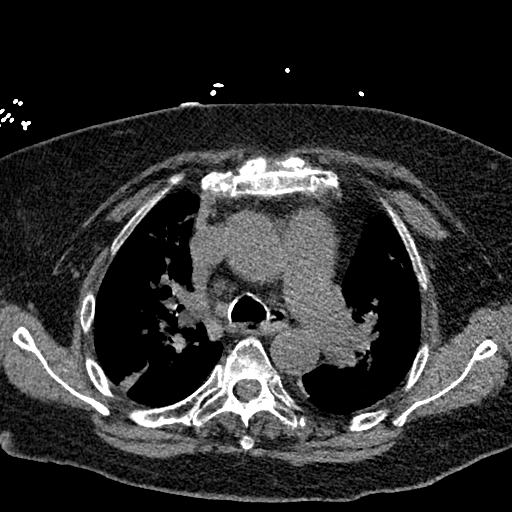
[im 97/140  lung]
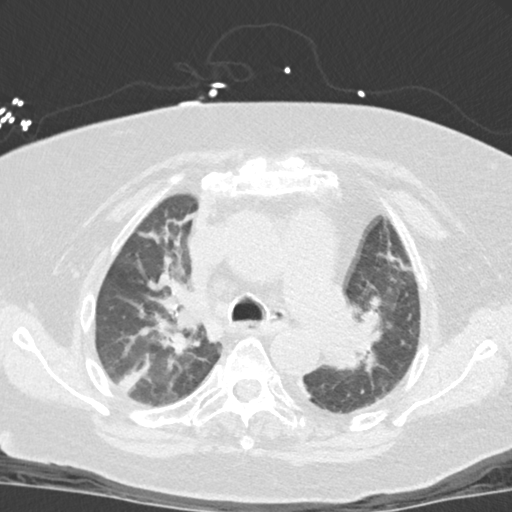
[im 107/140  lung]
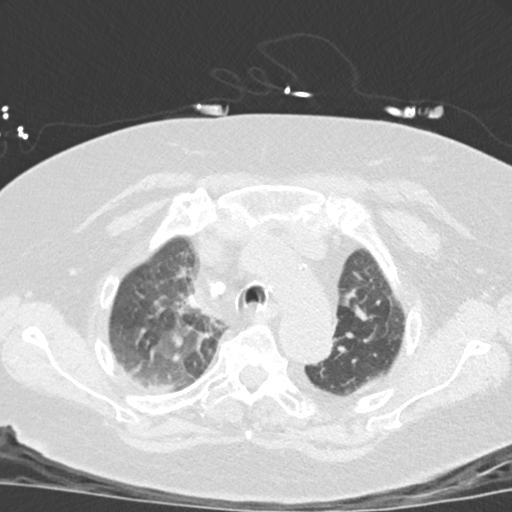
[im 118/140  lung]
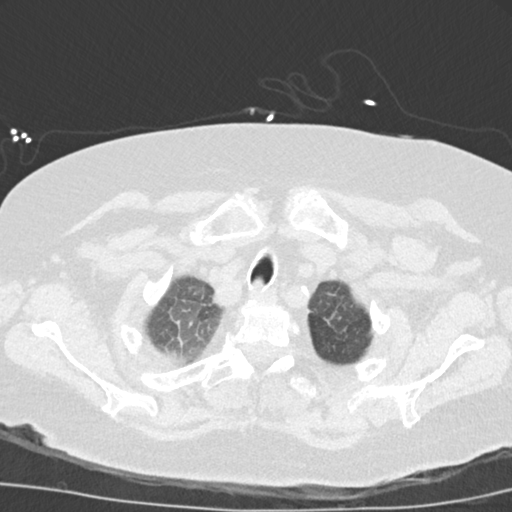
[im 129/140  lung]
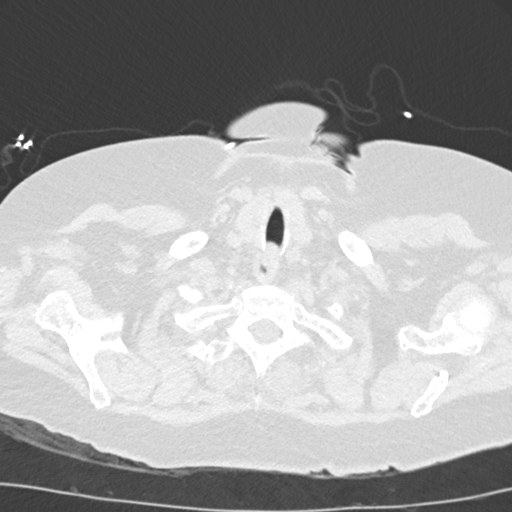

[Series 5: coronal · coronal · 0.59mm/px · 3 of 151 slices shown]
[im 31/151  lung]
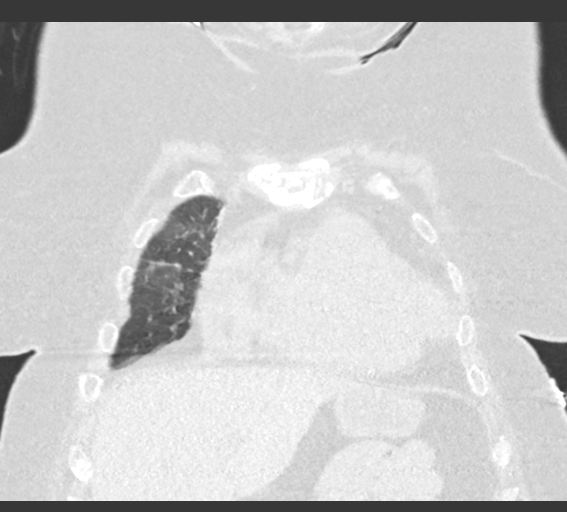
[im 61/151  lung]
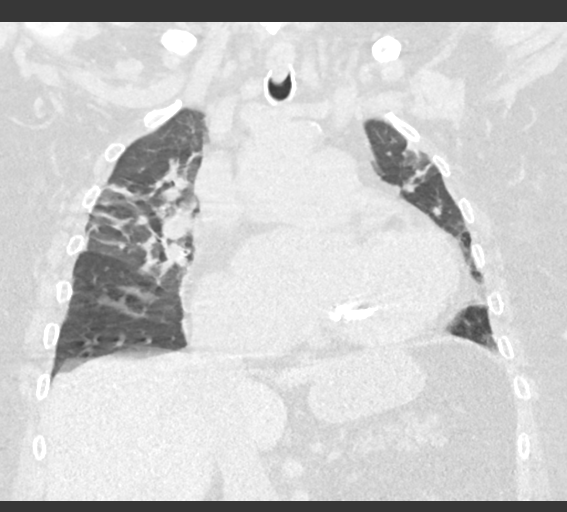
[im 91/151  lung]
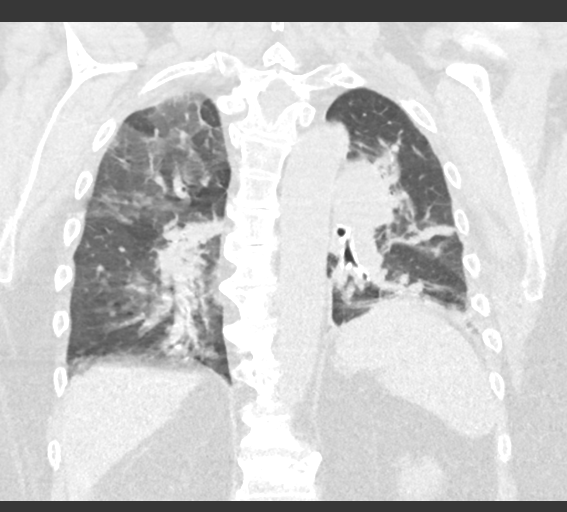

[15 of 36 positions shown; findings below may reference images not displayed]

FINDINGS: Cardiovascular: Moderate enlargement of the heart. No pericardial
effusion. No coronary artery calcifications. Aorta is normal in
caliber. Mild aortic atherosclerotic calcification along the arch.
Enlarged pulmonary arteries, right 3.1 cm, left 3.2 cm.

Mediastinum/Nodes: No neck base, mediastinal or hilar masses. No
discrete enlarged lymph nodes. Trachea and esophagus are
unremarkable.

Lungs/Pleura: Bronchial wall thickening and peribronchovascular
opacity is noted in the lower lobes and, to lesser degree, right
middle lobe and left upper lobe lingula. There are additional areas
of linear/discoid opacity bilaterally that are consistent with
atelectasis. Bilateral interstitial thickening is noted. There is
mild hazy ground-glass opacity most evident in the right upper lobe.

No pleural effusion.  No pneumothorax.

Upper Abdomen: No acute abnormality.

Musculoskeletal: Ununited fracture of the upper sternum at the level
of the sternal manubrial joint, new since the prior CT. Old mild
compression fracture of T4 and old fracture of L1. No other
fractures. No osteoblastic or osteolytic lesions.
IMPRESSION: 1. Lungs demonstrate patchy peribronchovascular and discoid type
opacities that may all be due to atelectasis. Multifocal pneumonia
is in the differential. There also hazy ground-glass opacities that
are most evident in the right upper lobe, which could be due to
infection or inflammation or be due to shunting related to airways
disease.
2. Lungs also demonstrate bilateral interstitial thickening, mild in
similar to the prior CT. No overt pulmonary edema. Overall
appearance of the lungs is similar to the prior chest CT. No
evidence of an abscess and no pleural effusion.
3. Moderate cardiomegaly, stable.
4. Enlarged pulmonary arteries consistent with pulmonary artery
hypertension, also unchanged.

Aortic Atherosclerosis ([id number].X).

## 2022-05-12 ENCOUNTER — Ambulatory Visit (HOSPITAL_COMMUNITY)
Admission: RE | Admit: 2022-05-12 | Discharge: 2022-05-12 | Disposition: A | Discharge status: Home / Self Care | Payer: HMO | Source: Ambulatory Visit | Attending: Acute Care | Admitting: Acute Care

## 2022-05-12 DIAGNOSIS — Z930 Tracheostomy status: Secondary | ICD-10-CM | POA: Insufficient documentation

## 2022-05-12 DIAGNOSIS — E662 Morbid (severe) obesity with alveolar hypoventilation: Secondary | ICD-10-CM | POA: Diagnosis present

## 2022-05-12 DIAGNOSIS — J386 Stenosis of larynx: Secondary | ICD-10-CM | POA: Diagnosis not present

## 2022-05-12 DIAGNOSIS — Z9911 Dependence on respirator [ventilator] status: Secondary | ICD-10-CM | POA: Diagnosis not present

## 2022-05-12 NOTE — Progress Notes (Signed)
Reason for visit Planned trach change  HPI 71 year old pt well known to me. Follow her for trach dependence for OHS,OSA on nocturnal ventilation. I last saw her Aug 2023. She has a more complicated trach hx for on-going aphonia for which I referred to her [facility] ENT.   Recent ENT Hx -Referred to ENT May 2023 for subglottis stenosis. subglottic mass removed  -direct laryngoscopy and airway evaluation in September 2023.  Large subglottic posteriorly-based granuloma which was eminating superiorly through the glottis, biopsied and send for permanent pathology; thick circumferential subglottic granulations tissue, which was removed with cupped forceps and coblator  Much improved subglottic airway following the procedure F/u 11/8: had been doing better initially post-op.  he visualized subglottis and proximal trachea are patent and the Shiley tracheostomy tube is visible in the subglottis the trachea is edematous and there does not appear to be much room around the tracheostomy device they recommended fenestrated trach   Presents today for planned trach change  Review of Systems  Constitutional: Negative.   HENT: Negative.    Eyes: Negative.   Respiratory:         Mild increase in thicker trach secretions   Cardiovascular: Negative.   Gastrointestinal: Negative.   Genitourinary: Negative.   Musculoskeletal: Negative.   Skin: Negative.    Exam General 71 year old female presents to clinic today w/ he daughter. She is in no distress HENT NCAT no JVD had 6 cuffed trach stoma unremarkable. I changed this to fenestrated trach w/ fairly strong return of hoarse phonation Pulm some scattered rhonchi otherwise unremarkable Card rrr Abd soft  Ext warm and dry  Neuro intact  Procedure Trach change The patient was pre-medicated w/ inhaled Lidocaine. The size 6 cuffed trach was removed. A new 6 cuffed Fenestrated trach was inserted over obturator. Pt tolerated well and placement was verified via  ETCO2  Active Problems:   Tracheostomy dependence (HCC)   Dependence on home ventilator (HCC)   Obesity hypoventilation syndrome (HCC)   Subglottic stenosis    Tracheostomy dependence (HCC) Overview: Current trach Trach Brand: Shiley Size: 6.0 6FEN Style: Cuffed  (fenestrated) Last change 11/9  Discussion As per recs from ENT we placed Fenestrated trach today. She did have nice return of phonation w/ this. I do worry that as she had return of voice after initial intervention in the subglottic area then subsequently it got worse again It raises concern for could there be tracheal stenosis in the area of trachea where the tracheostomy tube sits. My only concern with the fenestrated trach is that in my limited experience with it in both cases the patients developed increased granulomatous tissue at the area of where the fenestration is located which resulted in pretty significant pain. In both of these situations the patients had the fenestration open 24/7 but this issue resolved when changing back to regular trach  Plan Continue q 4-6 week trach changes I have instructed her for now to only [place] the fenestrated inner cannula up to 6 hrs a day and then use the regular inner cannula the rest of the time Will continue regular routine trach care      My time 32 min  Simonne Martinet ACNP-BC [facility] Pulmonary/Critical Care Pager # [phone] OR # [phone] if no answer

## 2022-05-12 NOTE — Progress Notes (Addendum)
Tracheostomy Procedure Note  [name] [id number] 07-18-1950  Pre Procedure Tracheostomy Information  Trach Brand: Shiley Size:  6.0 [id number] Style: Cuffed Secured by: Velcro Lidocaine neb given before and after trach change  Procedure: Trach Change and Trach Cleaning    Post Procedure Tracheostomy Information  Trach Brand: Shiley Size:  6.0 6FEN Style: Cuffed Secured by: Velcro   Post Procedure Evaluation:  ETCO2 positive color change from yellow to purple : Yes.   Vital signs:VSS Patients current condition: stable Complications: No apparent complications Trach site exam: clean, dry Wound care done: dry and 4 x 4 gauze drain Patient did tolerate procedure well.   Education: Educated on new style trach  Only wear fenestrated inner cannula for 6 hours daily   Prescription needs: none    Additional needs: 1 extra 6FEN trach given to daughter to take home

## 2022-05-17 IMAGING — MR MR LUMBAR SPINE W/O CM
4 of 9 series · 17 of 48 positions shown · non-contrast
Comparison: Lumbar MRI 07/27/2018.  Radiographs 07/02/2018.

CLINICAL DATA: Low back pain for several years with pain in the
lower extremities and buttocks. History of back surgery in 3330 and
5958.

EXAM:
MRI LUMBAR SPINE WITHOUT CONTRAST
TECHNIQUE: Multiplanar, multisequence MR imaging of the lumbar spine was
performed. No intravenous contrast was administered.

[Series 6: T2 · sagittal · 4.0mm · 0.73mm/px · 5 of 18 slices shown (1 of 3)]
[im 1/18]
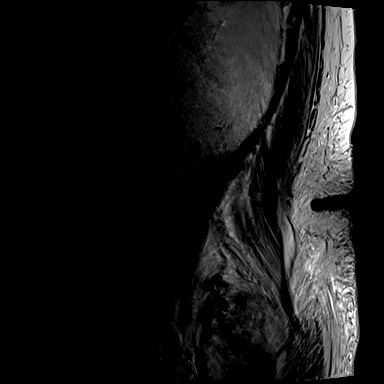
[im 5/18]
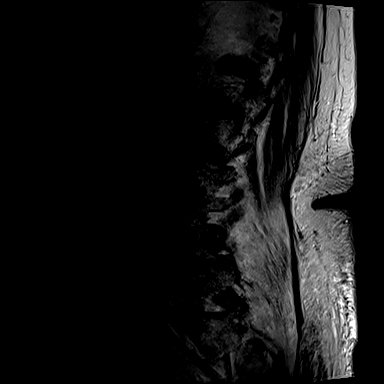
[im 9/18]
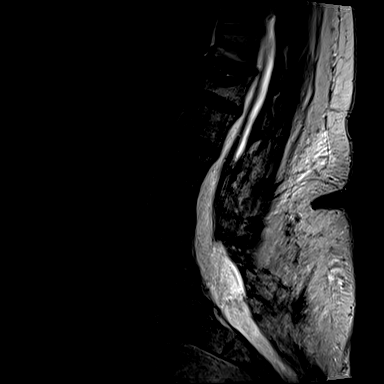
[im 13/18]
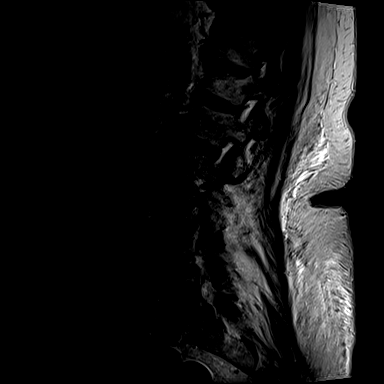
[im 18/18]
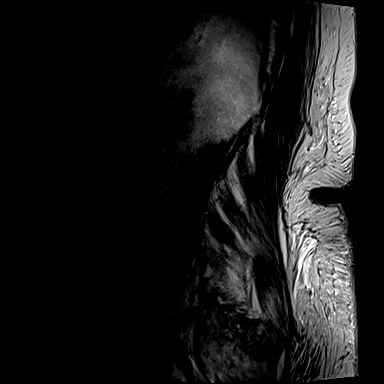

[Series 9: T1 · sagittal · 4.0mm · 0.88mm/px · 3 of 18 slices shown]
[im 1/18]
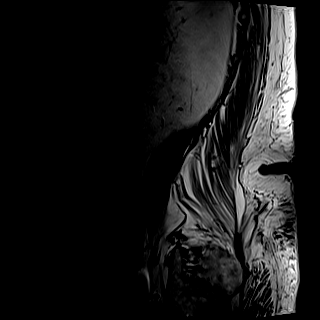
[im 12/18]
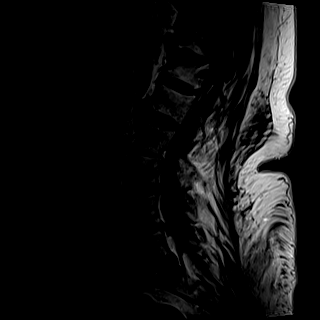
[im 18/18]
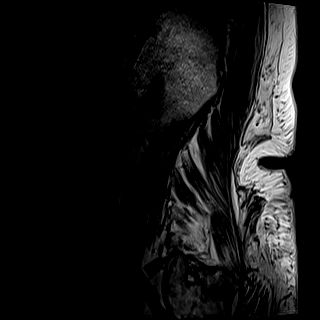

[Series 15: T2 · axial · 4.0mm · 0.28mm/px · z∈[-97,+70]mm · 6 of 39 slices shown (2 of 3)]
[im 1/39]
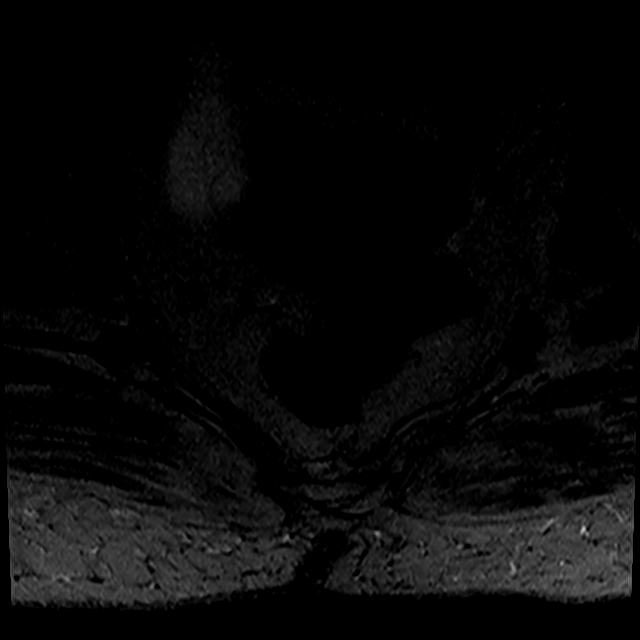
[im 5/39]
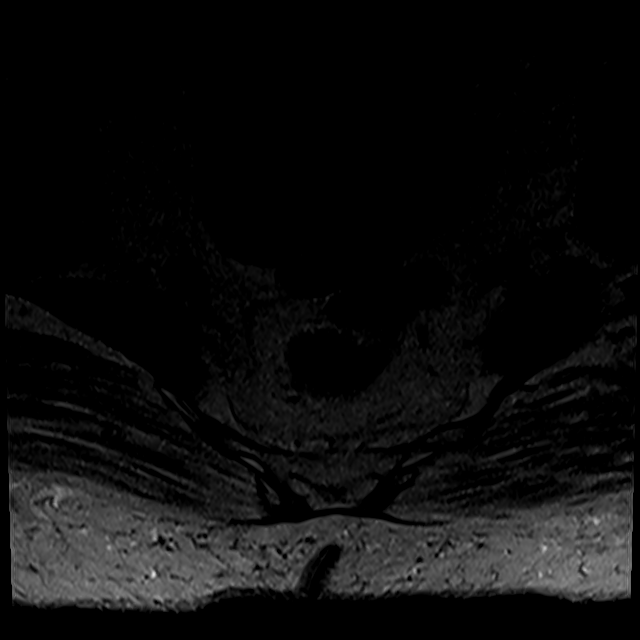
[im 10/39]
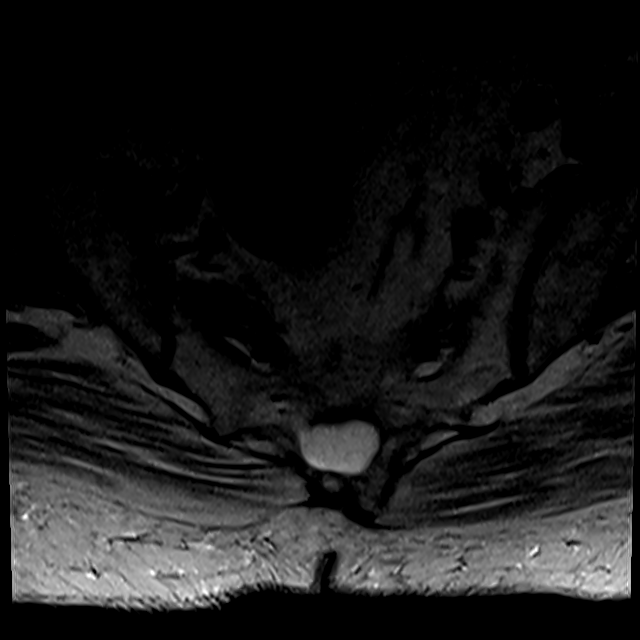
[im 15/39]
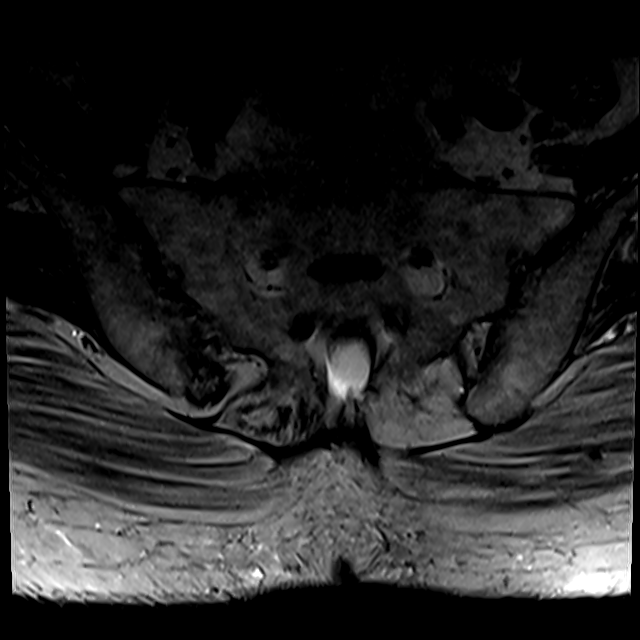
[im 20/39]
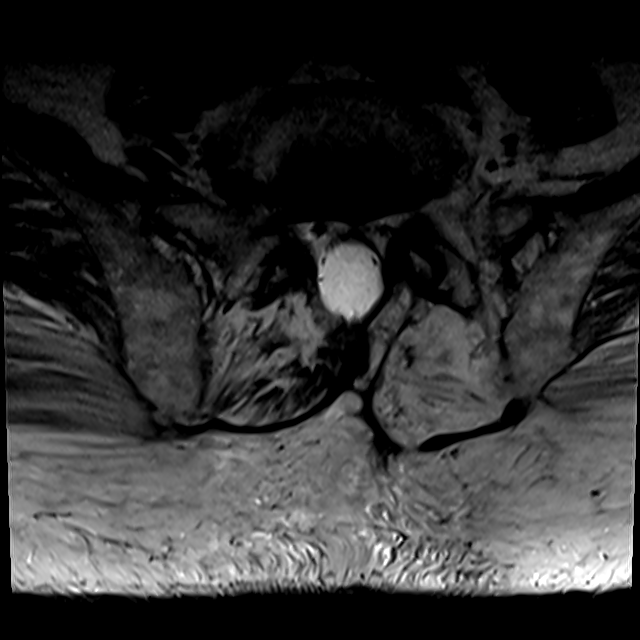
[im 34/39]
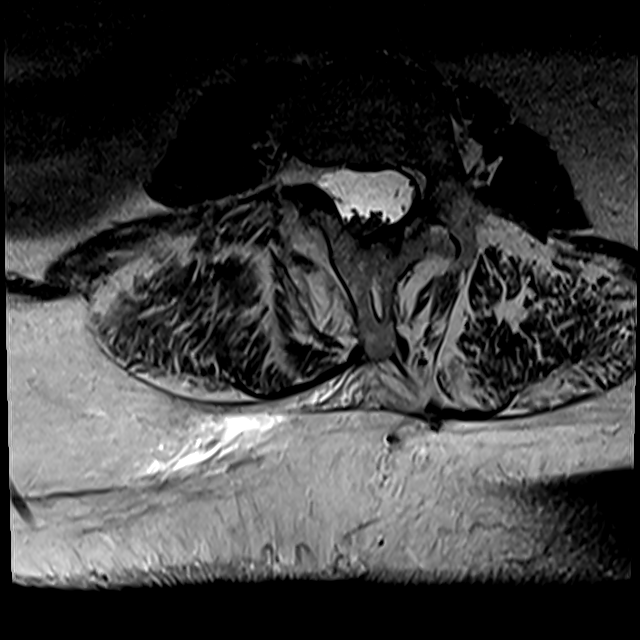

[Series 18: T2 · axial · 4.0mm · 0.28mm/px · z∈[+9,+178]mm · 3 of 42 slices shown (3 of 3)]
[im 5/42]
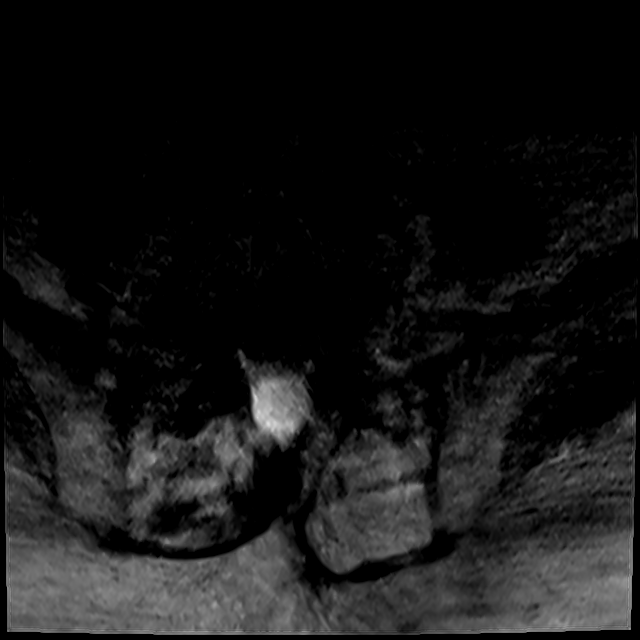
[im 23/42]
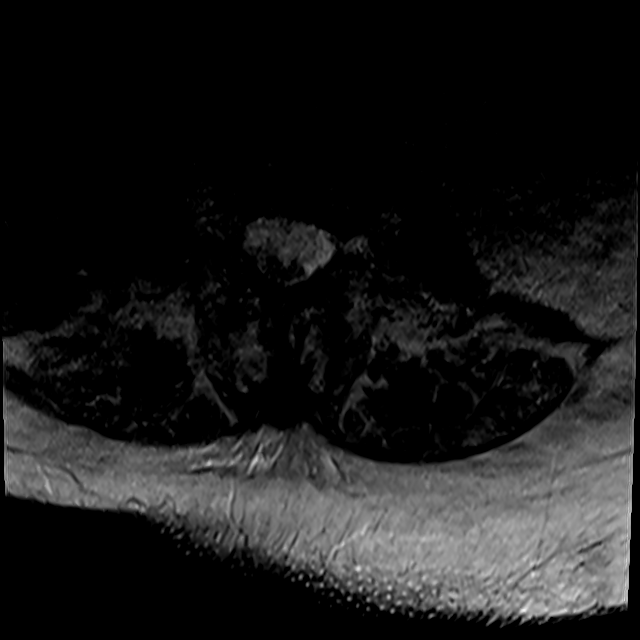
[im 37/42]
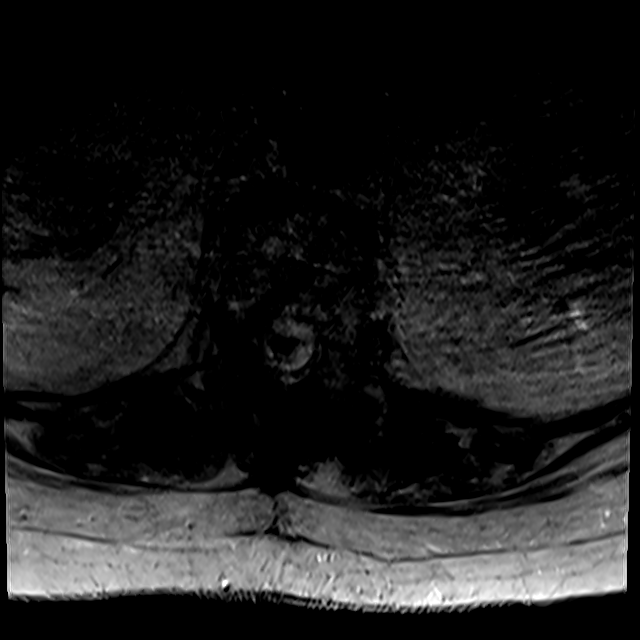

[17 of 48 positions shown; findings below may reference images not displayed]

FINDINGS: Despite efforts by the technologist and patient, mild motion
artifact is present on today's exam and could not be eliminated.
This reduces exam sensitivity and specificity. Several sequences
were repeated.

Segmentation: Conventional anatomy assumed, with the last open disc
space designated L5-S1.Concordant with previous imaging. Congenital
incomplete segmentation at L2-3.

Alignment:  Stable mild convex left scoliosis.

Vertebrae: No worrisome osseous lesion, acute fracture or pars
defect. Grossly stable chronic T12 compression deformity status post
spinal augmentation. Adjacent chronic Schmorl's node involving the
inferior endplate of T11. Congenital incomplete segmentation anomaly
at L2-3.

Conus medullaris: Extends to the L5-S1 level and is posteriorly
tethered with underlying diastematomyelia. Appearance is stable.

Paraspinal and other soft tissues: No significant paraspinal
findings.

Disc levels:

Grossly stable disc bulging and small central disc protrusions at
T10-11 and T11-12. No cord deformity or significant foraminal
compromise.

T12-L1: Stable mild disc bulging and tiny central disc protrusion.

L1-2: Preserved disc height with stable mild disc bulging and
anterior osteophyte formation. No spinal stenosis or nerve root
encroachment.

L2-3: Congenital segmentation anomaly with vestigial disc. No spinal
stenosis or nerve root encroachment.

L3-4: Preserved disc height with mild disc bulging and bilateral
facet hypertrophy. The L3 nerve roots are relatively inferiorly
position within the foramina. No significant spinal stenosis or
nerve root encroachment.

L4-5: Chronic degenerative disc disease with loss of disc height and
endplate osteophytes. The posterior elements are dysplastic. The
spinal canal is well-decompressed status post posterior
decompression. The right foramen is widely patent. There is mild
chronic left foraminal narrowing.

L5-S1: Chronic degenerative disc disease with asymmetric disc
bulging and endplate osteophytes on the right contributing to
chronic moderate right foraminal narrowing. The spinal canal and
left foramen appear patent.
IMPRESSION: 1. No acute findings or explanation for the patient's symptoms.
2. Stable chronic degenerative disc disease and facet hypertrophy at
L4-5 and L5-S1. Stable chronic moderate right foraminal narrowing at
L5-S1.
3. Grossly stable chronic T12 compression deformity status post
spinal augmentation.
4. Congenital incomplete segmentation at L2-3, tethered cord and
diastematomyelia again noted.

## 2022-05-24 ENCOUNTER — Other Ambulatory Visit: Payer: Self-pay | Admitting: Gastroenterology

## 2022-05-24 DIAGNOSIS — K580 Irritable bowel syndrome with diarrhea: Secondary | ICD-10-CM

## 2022-06-07 ENCOUNTER — Ambulatory Visit (HOSPITAL_COMMUNITY): Payer: HMO

## 2022-06-10 ENCOUNTER — Ambulatory Visit (HOSPITAL_COMMUNITY)
Admission: RE | Admit: 2022-06-10 | Discharge: 2022-06-10 | Disposition: A | Discharge status: Home / Self Care | Payer: HMO | Source: Ambulatory Visit | Attending: Acute Care | Admitting: Acute Care

## 2022-06-10 DIAGNOSIS — E662 Morbid (severe) obesity with alveolar hypoventilation: Secondary | ICD-10-CM | POA: Diagnosis present

## 2022-06-10 DIAGNOSIS — G4733 Obstructive sleep apnea (adult) (pediatric): Secondary | ICD-10-CM | POA: Diagnosis present

## 2022-06-10 DIAGNOSIS — Z930 Tracheostomy status: Secondary | ICD-10-CM | POA: Diagnosis not present

## 2022-06-10 DIAGNOSIS — J386 Stenosis of larynx: Secondary | ICD-10-CM | POA: Diagnosis not present

## 2022-06-10 DIAGNOSIS — Z9911 Dependence on respirator [ventilator] status: Secondary | ICD-10-CM | POA: Diagnosis not present

## 2022-06-10 NOTE — Progress Notes (Signed)
Tracheostomy Procedure Note  [name] [id number] June 10, 1951  Pre Procedure Tracheostomy Information  Trach Brand: Shiley Size:  6.0  FEN Style: Cuffed Secured by: Velcro   Procedure: Trach Change and Trach Cleaning    Post Procedure Tracheostomy Information  Trach Brand: Shiley Size: 6.0 [id number] Style: Cuffed Secured by: Velcro   Post Procedure Evaluation:  ETCO2 positive color change from yellow to purple : Yes.   Vital signs:VSS Patients current condition: stable Complications: No apparent complications Trach site exam: clean, Trach site red Wound care done: 4 x 4 gauze drain Patient did tolerate procedure well.   Education: none  Prescription needs: none    Additional needs: none

## 2022-06-10 NOTE — Progress Notes (Signed)
Reason for visit Planned trach change  HPI 71 year old pt well known to me. Follow her for trach dependence for OHS,OSA on nocturnal ventilation. I last saw her Aug 2023. She has a more complicated trach hx for on-going aphonia for which I referred to her [facility] ENT.   Recent ENT Hx -Referred to ENT May 2023 for subglottis stenosis. subglottic mass removed  -direct laryngoscopy and airway evaluation in September 2023.  Large subglottic posteriorly-based granuloma which was eminating superiorly through the glottis, biopsied and send for permanent pathology; thick circumferential subglottic granulations tissue, which was removed with cupped forceps and coblator  Much improved subglottic airway following the procedure F/u 11/8: had been doing better initially post-op.  he visualized subglottis and proximal trachea are patent and the Shiley tracheostomy tube is visible in the subglottis the trachea is edematous and there does not appear to be much room around the tracheostomy device they recommended fenestrated trach which we placed.   Presents today for planned trach change Per daughter.. more trach discomfort. More frequent sxn. Patient wants to go back to old regular cuffed trach.   Review of Systems  Constitutional:  Negative for fever.  HENT: Negative.  Negative for congestion and sinus pain.   Eyes: Negative.   Respiratory: Negative.         Mild increase in thicker trach secretions   Cardiovascular: Negative.   Gastrointestinal: Negative.   Genitourinary: Negative.   Musculoskeletal: Negative.   Skin: Negative.    Exam General 71 yr old female no distress HENT NCAT does have a little more bloody secretions than usual.  Pulm clear  Card rrr Abd soft Ext warm and dry  Neuro intact   Procedure Trach change Old trach removed site was inspected there was some bloody dc at stoma site. Also some bloody trach secretions in the inner cannula. A new # 6 shiley was placed over obturator.  Placement was verified via ETCO2/ pt tolerated well   Active Problems:   Tracheostomy dependence (HCC)   Subglottic stenosis   OSA (obstructive sleep apnea)   Dependence on home ventilator (HCC)   Obesity hypoventilation syndrome (HCC)       Tracheostomy dependence (HCC) Overview: Current trach Trach Brand: Shiley Size: 6. cuffed Last change 12/8  Discussion Did not tolerate fenestrated trach. I suspect increase in granulation tissue from area of fenestration   Plan ROV 5 weeks for routine trach change Cont routine trach care        My time 32 min  Simonne Martinet ACNP-BC [facility] Pulmonary/Critical Care Pager # [phone] OR # [phone] if no answer

## 2022-06-16 LAB — LAB REPORT - SCANNED
A1c: 8.9
EGFR: 40

## 2022-08-01 ENCOUNTER — Ambulatory Visit (HOSPITAL_COMMUNITY)
Admission: RE | Admit: 2022-08-01 | Discharge: 2022-08-01 | Disposition: A | Discharge status: Home / Self Care | Payer: HMO | Source: Ambulatory Visit | Attending: Acute Care | Admitting: Acute Care

## 2022-08-01 DIAGNOSIS — Z930 Tracheostomy status: Secondary | ICD-10-CM | POA: Diagnosis present

## 2022-08-01 DIAGNOSIS — G4733 Obstructive sleep apnea (adult) (pediatric): Secondary | ICD-10-CM | POA: Diagnosis present

## 2022-08-01 DIAGNOSIS — Z9911 Dependence on respirator [ventilator] status: Secondary | ICD-10-CM

## 2022-08-01 DIAGNOSIS — J386 Stenosis of larynx: Secondary | ICD-10-CM | POA: Diagnosis present

## 2022-08-01 DIAGNOSIS — E662 Morbid (severe) obesity with alveolar hypoventilation: Secondary | ICD-10-CM | POA: Diagnosis present

## 2022-08-01 NOTE — Progress Notes (Signed)
Reason for visit Planned trach change  HPI 72 year old [name] well known to me. Follow her for trach dependence for OHS,OSA on nocturnal ventilation. I last saw her Aug 2023. She has a more complicated trach hx for on-going aphonia for which I referred to her [facility] ENT.   Recent ENT Hx -Referred to ENT May 2023 for subglottis stenosis. subglottic mass removed  -direct laryngoscopy and airway evaluation in September 2023.  Large subglottic posteriorly-based granuloma which was eminating superiorly through the glottis, biopsied and send for permanent pathology; thick circumferential subglottic granulations tissue, which was removed with cupped forceps and coblator  Much improved subglottic airway following the procedure F/u 11/8: had been doing better initially post-op.  he visualized subglottis and proximal trachea are patent and the Shiley tracheostomy tube is visible in the subglottis the trachea is edematous and there does not appear to be much room around the tracheostomy device they recommended fenestrated trach which we placed.   Presents today for planned trach change   Review of Systems  Constitutional:  Negative for chills, fever, malaise/fatigue and weight loss.  HENT: Negative.    Eyes: Negative.   Respiratory: Negative.    Cardiovascular: Negative.   Gastrointestinal: Negative.   Genitourinary: Negative.   Musculoskeletal: Negative.   Skin: Negative.   Neurological: Negative.   Endo/Heme/Allergies: Negative.    Exam General chronically ill 72 year old female patient well-known to me presents for planned tracheostomy change she is in no acute distress HEENT normocephalic atraumatic size 6 cuffed trach is midline, the tracheostomy stoma is unremarkable, phonation quality quite poor but this is typical she is essentially aphonic, at times her voice quality is strong enough to produce audible words but typically that is not the case Pulmonary: Clear to auscultation no accessory  use Cardiac: Regular rate and rhythm Abdomen: Soft nontender no organomegaly Extremities: Warm dry with brisk capillary refill  Procedure Trach change Old trach removed. Site inspected. New size 6 trach placed over obturator. [name] tolerated well. Placement verified via ETCO2   Active Problems:   Tracheostomy dependence (HCC)   Subglottic stenosis   OSA (obstructive sleep apnea)   Dependence on home ventilator ([place])   Obesity hypoventilation syndrome ([place])       Tracheostomy dependence ([place]) Overview: Current trach Trach Brand: Shiley Size: 6. cuffed Last change 1/29  Discussion Did not tolerate fenestrated trach. I suspect increase in granulation tissue from area of fenestration   Plan ROV every 4-5  weeks  Cont routine trach care        My time 20 min  Erick Colace ACNP-BC [place] Pager # [phone] OR # [phone] if no answer

## 2022-08-01 NOTE — Progress Notes (Signed)
Tracheostomy Procedure Note  [name] [id number] [birth year]  Pre Procedure Tracheostomy Information  Trach Brand: Shiley Size:  6.0  [id number] Style: Cuffed Secured by: Velcro   Procedure: Trach Change and Trach Cleaning    Post Procedure Tracheostomy Information  Trach Brand: Shiley Size:  6.0 [id number] Style: Cuffed Secured by: Velcro   Post Procedure Evaluation:  ETCO2 positive color change from yellow to purple : Yes.   Vital signs:VSS Patients current condition: stable Complications: No apparent complications Trach site exam: clean, dry Wound care done: 4 x 4 gauze drain Patient did tolerate procedure well.   Education: none  Prescription needs: none    Additional needs: none

## 2022-08-16 ENCOUNTER — Telehealth: Payer: Self-pay | Admitting: Internal Medicine

## 2022-08-17 NOTE — Telephone Encounter (Signed)
[employer] is able to see pt's OV notes in their sytem without [place] needing to fax over the notes to them.

## 2022-08-23 ENCOUNTER — Telehealth: Payer: Self-pay | Admitting: Orthopaedic Surgery

## 2022-08-23 NOTE — Telephone Encounter (Signed)
Tried returning the [place], the patient's daughter's call multiple times, unable to leave a message.  [phone]

## 2022-09-01 ENCOUNTER — Encounter: Payer: Self-pay | Admitting: Radiology

## 2022-09-08 ENCOUNTER — Ambulatory Visit (HOSPITAL_COMMUNITY)
Admission: RE | Admit: 2022-09-08 | Discharge: 2022-09-08 | Disposition: A | Discharge status: Home / Self Care | Payer: HMO | Source: Ambulatory Visit | Attending: Acute Care | Admitting: Acute Care

## 2022-09-08 DIAGNOSIS — Z930 Tracheostomy status: Secondary | ICD-10-CM | POA: Diagnosis not present

## 2022-09-08 DIAGNOSIS — Z9911 Dependence on respirator [ventilator] status: Secondary | ICD-10-CM

## 2022-09-08 DIAGNOSIS — E662 Morbid (severe) obesity with alveolar hypoventilation: Secondary | ICD-10-CM

## 2022-09-08 DIAGNOSIS — G4733 Obstructive sleep apnea (adult) (pediatric): Secondary | ICD-10-CM | POA: Diagnosis present

## 2022-09-08 DIAGNOSIS — J386 Stenosis of larynx: Secondary | ICD-10-CM

## 2022-09-08 NOTE — Progress Notes (Addendum)
Tracheostomy Procedure Note  GANA GILGENBACH [id number] [birth year]  Pre Procedure Tracheostomy Information  Trach Brand: Shiley Size:  6.0 [id number] Style: Cuffed Secured by: Velcro   Procedure: Trach Change and Trach Cleaning  Lidocaine neb tx given trach prior to trach change  Post Procedure Tracheostomy Information  Trach Brand: Shiley Size:  6.0  [id number] Style: Cuffed Secured by: Velcro   Post Procedure Evaluation:  ETCO2 positive color change from yellow to purple : Yes.   Vital signs:VSS Patients current condition: stable Complications: No apparent complications Trach site exam: clean, dry Wound care done: 4 x 4 gauze dain Patient did tolerate procedure well.   Education: none  Prescription needs: none    Additional needs: none

## 2022-09-08 NOTE — Progress Notes (Signed)
Reason for visit Planned trach change  HPI 72 year old pt well known to me. Follow her for trach dependence for OHS,OSA on nocturnal ventilation. I last saw her Aug 2023. She has a more complicated trach hx for on-going aphonia for which I referred to her [facility] ENT.   Recent ENT Hx -Referred to ENT May 2023 for subglottis stenosis. subglottic mass removed  -direct laryngoscopy and airway evaluation in September 2023.  Large subglottic posteriorly-based granuloma which was eminating superiorly through the glottis, biopsied and send for permanent pathology; thick circumferential subglottic granulations tissue, which was removed with cupped forceps and coblator  Much improved subglottic airway following the procedure F/u 11/8: had been doing better initially post-op.  he visualized subglottis and proximal trachea are patent and the Shiley tracheostomy tube is visible in the subglottis the trachea is edematous and there does not appear to be much room around the tracheostomy device they recommended fenestrated trach which we placed.  This was subsequently discontinued as the patient did not tolerate it.  Presents today for planned trach change   Review of Systems  Constitutional:  Negative for chills, fever, malaise/fatigue and weight loss.  HENT: Negative.    Eyes: Negative.   Respiratory: Negative.    Cardiovascular: Negative.   Gastrointestinal: Negative.   Genitourinary: Negative.   Musculoskeletal: Negative.   Skin: Negative.   Neurological: Negative.   Endo/Heme/Allergies: Negative.    Exam General: 72 year old female patient well-known to me currently in no acute distress HEENT normocephalic atraumatic no jugular venous distention appreciated she has a size 6 cuffed tracheostomy.  She is aphonic, this is baseline for her.  Tracheostomy stoma is unremarkable Pulmonary: Clear to auscultation and without accessory use Cardiac: Regular rate and rhythm without murmur rub or  gallop Abdomen: Soft nontender Extremities: Warm dry no edema  Procedure Trach change Old trach removed. Site inspected. New size 6 trach placed over obturator. Pt tolerated well. Placement verified via ETCO2.  Patient tolerated this well.   Active Problems:   Tracheostomy dependence (HCC)   Subglottic stenosis   OSA (obstructive sleep apnea)   Dependence on home ventilator ([place])   Obesity hypoventilation syndrome (HCC)       Tracheostomy dependence (HCC) Overview: Current trach Trach Brand: Shiley Size: 6. cuffed Last change 3/7  Discussion Intolerant of fenestrated trach.  Has significant cough and discomfort with PMV.  Does not tolerate this well at all but continues to try some  Plan ROV every 4-5  weeks  Cont routine trach care  Has follow-up with the ENT next month        My time 20 min  Erick Colace ACNP-BC [place] Pager # [phone] OR # [phone] if no answer

## 2022-09-14 LAB — LAB REPORT - SCANNED
A1c: 8.7
EGFR: 49

## 2022-09-21 ENCOUNTER — Other Ambulatory Visit: Payer: Self-pay | Admitting: Internal Medicine

## 2022-09-21 DIAGNOSIS — K580 Irritable bowel syndrome with diarrhea: Secondary | ICD-10-CM

## 2022-09-21 NOTE — Telephone Encounter (Signed)
[name], patient needs routine follow-up. Please reach out to her to see if she is ready to schedule.

## 2022-09-27 ENCOUNTER — Encounter (HOSPITAL_COMMUNITY): Payer: Self-pay | Admitting: Internal Medicine

## 2022-09-27 ENCOUNTER — Encounter: Payer: Self-pay | Admitting: Cardiology

## 2022-09-27 ENCOUNTER — Ambulatory Visit: Payer: HMO | Attending: Cardiology | Admitting: Cardiology

## 2022-09-27 VITALS — BP 136/78 | HR 89 | Ht 66.0 in | Wt 182.0 lb

## 2022-09-27 DIAGNOSIS — D6869 Other thrombophilia: Secondary | ICD-10-CM | POA: Diagnosis not present

## 2022-09-27 DIAGNOSIS — I5032 Chronic diastolic (congestive) heart failure: Secondary | ICD-10-CM | POA: Diagnosis not present

## 2022-09-27 DIAGNOSIS — I4891 Unspecified atrial fibrillation: Secondary | ICD-10-CM | POA: Diagnosis not present

## 2022-09-27 MED ORDER — FUROSEMIDE 40 MG PO TABS: 40.0000 mg | ORAL_TABLET | Freq: Every day | ORAL | 3 refills | Status: DC

## 2022-09-27 NOTE — Patient Instructions (Signed)
Medication Instructions:  Your physician has recommended you make the following change in your medication:   -Change Lasix to 40 mg tablets daily- may take an additional 20 mg as needed for swelling.  -Stop Amiodarone  *If you need a refill on your cardiac medications before your next appointment, please call your pharmacy*   Lab Work: None If you have labs (blood work) drawn today and your tests are completely normal, you will receive your results only by: [place] (if you have MyChart) OR A paper copy in the mail If you have any lab test that is abnormal or we need to change your treatment, we will call you to review the results.   Testing/Procedures: None   Follow-Up: At [facility], you and your health needs are our priority.  As part of our continuing mission to provide you with exceptional heart care, we have created designated Provider Care Teams.  These Care Teams include your primary Cardiologist (physician) and Advanced Practice Providers (APPs -  Physician Assistants and Nurse Practitioners) who all work together to provide you with the care you need, when you need it.  We recommend signing up for the patient portal called "MyChart".  Sign up information is provided on this After Visit Summary.  MyChart is used to connect with patients for Virtual Visits (Telemedicine).  Patients are able to view lab/test results, encounter notes, upcoming appointments, etc.  Non-urgent messages can be sent to your provider as well.   To learn more about what you can do with MyChart, go to [email or web address].    Your next appointment:   6 month(s)  Provider:   Carlyle Dolly, MD    Other Instructions

## 2022-09-27 NOTE — Progress Notes (Signed)
Clinical Summary Ms. Loughry is a 72 y.o.female seen today for follow up of the following medical problems.    Patient with chronic tracheostomy, history is provided by her daughter   1. Chronic systolic diastolic CHF with mixed precap and post postcap Pulmonary Hypertension - echo 12/2013 PASP 85, moderate TR, RV mild to moderately dilated with normal function, could not eval diasotlic function due to afib, LVEF 50-55%. Biatrial enlargement.   - RHC 01/2015 PA 38/13 and calculated mean of 21, could not get a wedge however LVEDP 15. - repeat echo 10/2014 PASP 61 mmHg (down from 85 on prior echo), some evidence of RV dysfunction with RV TAPSE 1.4 and tissue anular systolic velocity of 7.5. Cannot evaluate diastolic function, however severe biatrial enlarement suggests significant dysfunction, - 01/2015 RHC mean PA 52, PCWP 31 - 01/2015 echo LVEF 55-60%, PASP 46   - after most recent RHC in 01/2015 she was admitted for diuresis. Discharge weight 170 lbs, reportedly down from 190 on admission. Marland Kitchen Discharged on lasix 60mg  bid.          08/17/2020 echo [facility] LVEF 15-20% 08/20/2020 30 [facility]% 08/29/2020 [facility] LVEF 50-55%  -01/2021 45-50%    - some recent edema, R>L - home weights up to 180s, prior baseline 170s - takes lasix 40mg  daily.      2. Persistent Afib  - amio started 02/2015.  - has had severe diffusion dfect on PFTs 04/2015 prior - more recently amio continued for rate control in setting of prior hypotension     - no recent palpitations.   - 02/2021 normal LFTs, TSH 01/2021 was normal   - mild palpitations at times usually with activity - small amounts infrequent blood on toilet paper, pcp did recent cbc and she has upcoming appt with GI          3/Chronic resp failure - has trach, O2 collar during the day and vent at night Past Medical History:  Diagnosis Date   Abnormal pulmonary function test    Anemia    H/H of 10/30 with a normal MCV in 12/09   Anxiety     Arthritis    Barrett's esophagus    Diagnosed 1995. Last EGD 2016-NO BARRETT'S.    Chest pain    a. 2022 Cath: nl cors; b. 2008 Neg myoview; c. 01/2014 Cath: Nl cors. EF 40%; c. 08/2020 NSTEMI/Cath [facility]): nl cors.   Chronic anticoagulation    Chronic combined systolic and diastolic CHF (congestive heart failure) ([place])    a. 10/2014 Echo: EF40%; b. 01/2015 Echo: EF 55-60%; c. 11/2020 Echo: EF 55-60%, mod LVH, sev BAE, mild MR, mod TR. PASP 41mmHg; d. 01/2021 Echo: EF 45-50%, glob HK, mod conc LVH, mod reduced RV fxn, RVSP 60.23mmHg, Mod TR, mild to mod MR, sev dil LA.   Chronic LBP    Surgical intervention in 1996   Diabetes mellitus, type 2 ([place])    Insulin therapy; exacerbated by prednisone   Gastroparesis    99% retention 05/2008 on GES   GERD (gastroesophageal reflux disease)    Heart attack ([place]) 08/13/2020   Hiatal hernia    Hyperlipidemia    Hypertension    Hypothyroid    IBS (irritable bowel syndrome)    Obesity    OSA on CPAP    had CPAP and cannot tolerate.   Paroxysmal atrial fibrillation (HCC)    a.CHA2DS2VASc = 6-->eliquis. Also on Amio.   Pulmonary hypertension ([place]) 01/2015  a. 01/2015 RHC: Predominantly pulmonary venous hypertension but may be component of PAH (PA mean 52, PCWP 31); b. 01/2021 Echo: RVSP 60.62mmHg w/ mod reduced RV fxn.   Seizures ([place])    last seizure was 2 years ago; on keppra for this; unknown etiology   Syncope    a. Admitted 05/2009; magnetic resonance imagin/ MRA - negative; etiology thought to be orthostasis secondary to drugs and dehydration. b. Syncope 02/2015 also felt 2/2 dehydration.     Allergies  Allergen Reactions   Celexa [Citalopram Hydrobromide] Other (See Comments)    Dyskinesia    Codeine Nausea And Vomiting and Other (See Comments)    HALLUCINATIONS     Dilaudid [Hydromorphone Hcl] Other (See Comments)    Made her pass out    Reglan [Metoclopramide] Other (See Comments)    DYSKINESIA    Hyoscyamine Other (See Comments)     MADE DIARRHEA WORSE     Current Outpatient Medications  Medication Sig Dispense Refill   amiodarone (PACERONE) 200 MG tablet Take 1 tablet (200 mg total) by mouth daily.     apixaban (ELIQUIS) 5 MG TABS tablet Take 1 tablet (5 mg total) by mouth 2 (two) times daily.     atorvastatin (LIPITOR) 80 MG tablet Take 1 tablet (80 mg total) by mouth daily.     Cholecalciferol (VITAMIN D3) 50 MCG (2000 UT) capsule Take 2,000 Units by mouth daily.     dicyclomine (BENTYL) 10 MG capsule TAKE 1 CAPSULE BY MOUTH UP TO FOUR TIMES DAILY BEFORE MEALS AND AT BEDTIME (STOP IF CONSTIPATION) 120 capsule 1   donepezil (ARICEPT) 5 MG tablet Take 1 tablet (5 mg total) by mouth at bedtime.     DULoxetine (CYMBALTA) 30 MG capsule Take 1 capsule (30 mg total) by mouth daily. (Patient taking differently: Take 30 mg by mouth 2 (two) times daily.)  3   furosemide (LASIX) 40 MG tablet Take 1 tablet (40 mg total) by mouth daily.     gabapentin (NEURONTIN) 100 MG capsule Take 1 capsule (100 mg total) by mouth 3 (three) times daily. 90 capsule 3   insulin glargine (LANTUS) 100 UNIT/ML injection Inject 0.15 mLs (15 Units total) into the skin daily. (Patient taking differently: Inject 20-24 Units into the skin at bedtime.) 10 mL 11   ipratropium-albuterol (DUONEB) 0.5-2.5 (3) MG/3ML SOLN Take 3 mLs by nebulization every 4 (four) hours as needed (Asthma).     levETIRAcetam (KEPPRA) 500 MG tablet Take 500 mg by mouth 2 (two) times daily.     levothyroxine (SYNTHROID) 175 MCG tablet Take 1 tablet (175 mcg total) by mouth daily at 6 (six) AM.     LORazepam (ATIVAN) 1 MG tablet Take 1 mg by mouth at bedtime.     melatonin 3 MG TABS tablet Take 1 tablet (3 mg total) by mouth at bedtime as needed.  0   metoprolol tartrate (LOPRESSOR) 25 MG tablet Take 1 tablet (25 mg total) by mouth 2 (two) times daily.     Multiple Vitamins-Minerals (MULTIVITAMIN WITH MINERALS) tablet Take 1 tablet by mouth daily. For Diabetics     NOVOLIN R FLEXPEN  RELION 100 UNIT/ML FlexPen Inject 2-5 Units into the skin 3 (three) times daily before meals. Per sliding scale     pantoprazole (PROTONIX) 40 MG tablet Take 1 tablet (40 mg total) by mouth 2 (two) times daily before a meal. 60 tablet 3   potassium chloride (KLOR-CON) 10 MEQ tablet Take 20 mEq by mouth daily.  sertraline (ZOLOFT) 25 MG tablet Take 1 tablet (25 mg total) by mouth daily.     trolamine salicylate (ASPERCREME) 10 % cream Apply 1 application. topically as needed (knee pain).     vitamin B-12 (CYANOCOBALAMIN) 1000 MCG tablet Take 1,000 mcg by mouth daily.     No current facility-administered medications for this visit.     Past Surgical History:  Procedure Laterality Date   BACK SURGERY  1996   BIOPSY N/A 11/08/2013   Procedure: BIOPSY  / Tissue sampling / ulcers present in small intestine;  Surgeon: Danie Binder, MD;  [facility];  Service: Endoscopy;  Laterality: N/A;   CARDIAC CATHETERIZATION  2002   CARDIAC CATHETERIZATION N/A 01/26/2015   Procedure: Right Heart Cath;  Surgeon: Larey Dresser, MD;  Location: [place] [facility];  Service: Cardiovascular;  Laterality: N/A;   CARDIOVASCULAR STRESS TEST  2008   Stress nuclear study   CARDIOVERSION N/A 03/06/2015   Procedure: CARDIOVERSION;  Surgeon: Larey Dresser, MD;  Location: [place];  Service: Cardiovascular;  Laterality: N/A;   CARPAL TUNNEL RELEASE  1994   CATARACT EXTRACTION W/PHACO Right 01/10/2022   Procedure: CATARACT EXTRACTION PHACO AND INTRAOCULAR LENS PLACEMENT ([place]);  Surgeon: Baruch Goldmann, MD;  Location: [facility];  Service: Ophthalmology;  Laterality: Right;  CDE: 21.51   CATARACT EXTRACTION W/PHACO Left 01/24/2022   Procedure: CATARACT EXTRACTION PHACO AND INTRAOCULAR LENS PLACEMENT ([facility]);  Surgeon: Baruch Goldmann, MD;  [facility]: [facility];  Service: Ophthalmology;  Laterality: Left;  CDE: 17.13   COLONOSCOPY  11/2011   Dr. Oneida Alar: Internal hemorrhoids, mild diverticulosis. Random colon  biopsies negative.   COLONOSCOPY N/A 11/08/2013   SLF: Normal mucosa in the terminal ileum/The colon IS redundant/  Moderate diverticulosis throughout the entire colon. ileum bx benign. colon bx benign   CRYOTHERAPY  01/23/2021   Procedure: CRYOTHERAPY;  Surgeon: Candee Furbish, MD;  Location[facility] ENDOSCOPY;  Service: Pulmonary;;   CRYOTHERAPY  01/24/2021   Procedure: Cydney Ok;  Surgeon: Candee Furbish, MD;  Location: [facility] ENDOSCOPY;  Service: Pulmonary;;   CRYOTHERAPY  01/26/2021   Procedure: Cydney Ok;  Surgeon: Juanito Doom, MD;  Location: [facility] ENDOSCOPY;  Service: Cardiopulmonary;;   ESOPHAGOGASTRODUODENOSCOPY  2008   Barrett's without dysplasia. esphagus dilated. antral erosions, h.pylori serologies negative.   ESOPHAGOGASTRODUODENOSCOPY  11/2011   Dr. Oneida Alar: Barrett's esophagus, mild gastritis, diverticulum in the second portion of the duodenum repeat EGD 3 years. Small bowel biopsies negative. Gastric biopsy show reactive gastropathy but no H. pylori. Esophageal biopsies consistent with GERD. Next EGD 11/2014   ESOPHAGOGASTRODUODENOSCOPY N/A 11/21/2014   [id number] non-erosive gastritis/irregular z-line   ESOPHAGOGASTRODUODENOSCOPY  03/2022   FOREIGN BODY REMOVAL  01/23/2021   Procedure: FOREIGN BODY REMOVAL;  Surgeon: Candee Furbish, MD;  Location: [place];  Service: Pulmonary;;   FOREIGN BODY REMOVAL  01/24/2021   Procedure: FOREIGN BODY REMOVAL;  Surgeon: Candee Furbish, MD;  Location: [facility] ENDOSCOPY;  Service: Pulmonary;;   GIVENS CAPSULE STUDY  12/07/2011   Proximal small bowel, rare AVM. Distal small bowel, multiple ulcers noted   GIVENS CAPSULE STUDY N/A 09/27/2013   Distal small bowel ulcers extending to TI.   GIVENS CAPSULE STUDY N/A 10/10/2013   Procedure: GIVENS CAPSULE STUDY;  Surgeon: Danie Binder, MD;  [facility];  Service: Endoscopy;  Laterality: N/A;  7:30   HEMOSTASIS CONTROL  01/24/2021   Procedure: HEMOSTASIS CONTROL;  Surgeon: Candee Furbish, MD;  Location: [place];  Service: Pulmonary;;   HEMOSTASIS CONTROL  01/26/2021   Procedure: HEMOSTASIS CONTROL;  Surgeon: Juanito Doom, MD;  Location: [place];  Service: Cardiopulmonary;;   IR GASTROSTOMY TUBE MOD SED  01/11/2021   IR GASTROSTOMY TUBE REMOVAL  04/13/2021   IR KYPHO THORACIC WITH BONE BIOPSY  02/09/2018   KNEE ARTHROSCOPY WITH MEDIAL MENISECTOMY Right 06/09/2016   Procedure: KNEE ARTHROSCOPY WITH MEDIAL MENISECTOMY;  Surgeon: Carole Civil, MD;  Location[facility];  Service: Orthopedics;  Laterality: Right;  medial and lateral menisectomy   LAMINECTOMY  1995   L4-L5   LAPAROSCOPIC CHOLECYSTECTOMY  1990s   LEFT HEART CATHETERIZATION WITH CORONARY ANGIOGRAM  01/10/2014   Procedure: LEFT HEART CATHETERIZATION WITH CORONARY ANGIOGRAM;  Surgeon: Sinclair Grooms, MD;  Location: [facility] CATH LAB;  Service: Cardiovascular;;   MICROLARYNGOSCOPY WITH LASER AND BALLOON DILATION N/A 11/17/2021   Procedure: MICROLARYNGOSCOPY WITH CO2 LASER, SUBGLOTTIC LARYNGEAL MASS BIOSPY;  Surgeon: [name], MD;  Location: [place];  Service: ENT;  Laterality: N/A;   RIGHT HEART CATHETERIZATION N/A 01/10/2014   Procedure: RIGHT HEART CATH;  Surgeon: Sinclair Grooms, MD;  Location: [facility] CATH LAB;  Service: Cardiovascular;  Laterality: N/A;   TOTAL ABDOMINAL HYSTERECTOMY  1999   TRACHEOSTOMY REVISION N/A 07/18/2021   Procedure: TRACHEOSTOMY REVISION;  Surgeon: Melissa Montane, MD;  Location: [place];  Service: ENT;  Laterality: N/A;   TRACHEOSTOMY TUBE PLACEMENT N/A 09/01/2017   Procedure: TRACHEOSTOMY;  Surgeon: Rozetta Nunnery, MD;  Location: [place];  Service: ENT;  Laterality: N/A;   VIDEO BRONCHOSCOPY Bilateral 01/23/2021   Procedure: VIDEO BRONCHOSCOPY WITHOUT FLUORO;  Surgeon: Candee Furbish, MD;  Location: [facility] ENDOSCOPY;  Service: Pulmonary;  Laterality: Bilateral;   VIDEO BRONCHOSCOPY Bilateral 01/24/2021   Procedure: VIDEO BRONCHOSCOPY WITHOUT FLUORO;  Surgeon: Candee Furbish, MD;  Location: [facility] ENDOSCOPY;  Service: Pulmonary;  Laterality: Bilateral;   VIDEO BRONCHOSCOPY N/A 01/26/2021   Procedure: VIDEO BRONCHOSCOPY WITHOUT FLUORO;  Surgeon: Juanito Doom, MD;  Location: [place];  Service: Cardiopulmonary;  Laterality: N/A;     Allergies  Allergen Reactions   Celexa [Citalopram Hydrobromide] Other (See Comments)    Dyskinesia    Codeine Nausea And Vomiting and Other (See Comments)    HALLUCINATIONS     Dilaudid [Hydromorphone Hcl] Other (See Comments)    Made her pass out    Reglan [Metoclopramide] Other (See Comments)    DYSKINESIA    Hyoscyamine Other (See Comments)    MADE DIARRHEA WORSE      Family History  Problem Relation Age of Onset   Hypertension Mother    Alzheimer's disease Mother    Stroke Mother    Heart attack Mother    Breast cancer Sister    Hypertension Other    Heart disease Neg Hx    Colon cancer Neg Hx    Liver disease Neg Hx      Social History Ms. Cassetty reports that she quit smoking about 39 years ago. Her smoking use included cigarettes. She started smoking about 56 years ago. She has a 3.75 pack-year smoking history. She has never used smokeless tobacco. Ms. [place] reports that she does not currently use alcohol.   Review of Systems CONSTITUTIONAL: No weight loss, fever, chills, weakness or fatigue.  HEENT: Eyes: No visual loss, blurred vision, double vision or yellow sclerae.No hearing loss, sneezing, congestion, runny nose or sore throat.  SKIN: No rash or itching.  CARDIOVASCULAR: per hpi RESPIRATORY: per hpi GASTROINTESTINAL:  No anorexia, nausea, vomiting or diarrhea. No abdominal pain or blood.  GENITOURINARY: No burning on urination, no polyuria NEUROLOGICAL: No headache, dizziness, syncope, paralysis, ataxia, numbness or tingling in the extremities. No change in bowel or bladder control.  MUSCULOSKELETAL: No muscle, back pain, joint pain or stiffness.  LYMPHATICS: No enlarged nodes.  No history of splenectomy.  PSYCHIATRIC: No history of depression or anxiety.  ENDOCRINOLOGIC: No reports of sweating, cold or heat intolerance. No polyuria or polydipsia.  Marland Kitchen   Physical Examination Today's Vitals   09/27/22 1118  BP: 136/78  Pulse: 89  SpO2: 93%  Weight: 182 lb (82.6 kg)  Height: 5\' 6"  (1.676 m)   Body mass index is 29.38 kg/m.  Gen: resting comfortably, no acute distress HEENT: no scleral icterus, pupils equal round and reactive, no palptable cervical adenopathy,  CV: irreg, no m/r/g no jvd Resp: faint crackles bilaterally GI: abdomen is soft, non-tender, non-distended, normal bowel sounds, no hepatosplenomegaly MSK: trivial bilateral LE edema Skin: warm, no rash Neuro:  no focal deficits Psych: appropriate affect   Diagnostic Studies   08/2020 cath Findings:  1. No significant coronary artery disease.  2. Elevated right and left ventricular filling pressures (LVEDP = 27 mm  Hg).  3. Severe pulmonary hypertension      08/2020 echo Summary    1. Echo contrast utilized to enhance endocardial border definition.    2. The left ventricle is normal in size with mildly increased wall  thickness.    3. The left ventricular systolic function is severely decreased, LVEF is  visually estimated at 15-20%.    4. The left atrium is moderately dilated in size.    5. There is moderate tricuspid regurgitation.    6. There is severe pulmonary hypertension, estimated pulmonary artery  systolic pressure is 90 mmHg.    7. The right atrium is mildly to moderately dilated  in size.    8. IVC size and inspiratory change suggest mildly elevated right atrial  pressure. (5-10 mmHg).      08/2020 Summary    1. The left ventricle is upper normal in size with mildly increased wall  thickness.    2. The left ventricular systolic function is severely decreased, LVEF is  visually estimated at 30%.    3. There is severe hypokinesis of the apical left ventricular segments  with  normal contractile function of the basilar segments, consistent with a stress  cardiomyopathy.    4. Mitral annular calcification is present (severe).    5. The mitral valve leaflets are mildly thickened with normal leaflet  mobility.    6. There is mild mitral valve regurgitation.    7. The aortic valve is probably trileaflet with mildly thickened leaflets  with normal excursion.    8. The left atrium is moderately dilated in size.    9. The right ventricle is upper normal in size, with normal systolic  function.    10. The right atrium is mildly dilated  in size.       Assessment and Plan   1. Chronic diastolic heart failure -mild fluid overload today, will change lasix to 40mg  daily may take additional 20mg  prn   2. Afib/acquired thrombophilia - persistent afib. Amio had been continued previously for rate control in setting of hypotension. BP's much improved, will d/c amio and continue lopressor. Room to titrate lopressor if needed.  - cotninue eliquis for stroke prevention.      Arnoldo Lenis, M.D.

## 2022-10-03 NOTE — Progress Notes (Signed)
Referring Provider: Lemmie Evens, MD Primary Care Physician:  Lemmie Evens, MD Primary GI Physician: Dr. Abbey Chatters  Chief Complaint  Patient presents with   Follow-up    Sometimes diarrhea, sometimes constipation     HPI:   [name] is a 72 y.o. female  with history of CHF, restrictive lung disease, hospitalized in June 2022 with respiratory failure and required tracheostomy, IDA previously following with hematology and receiving IV iron due to oral iron intolerance,  IBS with diarrhea, chronic lower abdominal pain worsened postprandially with negative mesenteric [place] in 2021, gastroparesis, GERD, presenting today for routine follow-up.   Last seen via virtual visit 09/02/2021.  IBS-D was well-controlled with Bentyl 20 mg 3 times daily.  She was having about 1 bowel movement daily to every other day.  She was avoiding milk.  Denies abdominal pain or overt GI bleeding.  Reflux well-controlled on Protonix twice daily which was currently prescribed by PCP.  She was still requiring ventilation at night.  Noted CT in July 2022 without contrast that showed slight nodular appearance of the liver, normal spleen.  She had occasional isolated thrombocytopenia.  She reported chronic history of swelling in her lower extremities, but this was controlled with Lasix 40 mg daily.  No other symptoms of decompensated liver disease.  Recommended trying to decrease dicyclomine back to 10 mg 3 times daily before meals and at bedtime in efforts to minimize this medication as much as possible, continue Protonix 40 mg twice daily, and update ultrasound with elastography to evaluate possible cirrhosis.  Ultrasound abdomen with elastography 6 completed in July 2023.  No liver nodularity seen.  K PA was 5.0.  Spleen was normal.  Today:   IBS: Reports her bowels fluctuate between constipation to diarrhea.  Initially when asking how often she has diarrhea, she states every other day and can have up to 5 bowel  movements when this occurs, but after further discussion, it seems that 5 bowel movements a day is a rare occurrence.  When we discussed the last week, she has had 1 or 2 bowel movements per day or no bowel movement.  States when constipation occurs, she may skip a day between bowel movements or have hard stools with straining.  Has also had some stool seepage at times. She uses loperamide as needed for diarrhea.  Typically takes 1 but may take a second if she has another loose bowel movement.  Also taking dicyclomine 10 mg 3 times a day before meals.  Intermittent abdominal pain related to bowel movements.  Had a small amount of blood a few weeks ago when she was constipated and sat on the toilet for about 15 minutes.  No recurrent symptoms.  Reports having hemorrhoids.  No rectal pain.  Prefers to hold off on colonoscopy.  Would like to go back to see hematology for history of anemia.   GERD:  Well-controlled on pantoprazole 40 mg twice daily.  No dysphagia.  Remains on a ventilator at night.    Prior endoscopic evaluation:  EGD in May 2016 due to anemia and history of Barrett's without evidence of Barrett's. Last colonoscopy in May 2015 with redundant colon, moderate diverticulosis throughout the entire colon.  Capsule in April 2015 with idiopathic small bowel ulcers that have been present since 2013.    Past Medical History:  Diagnosis Date   Abnormal pulmonary function test    Anemia    H/H of 10/30 with a normal MCV in 12/09  Anxiety    Arthritis    Barrett's esophagus    Diagnosed 1995. Last EGD 2016-NO BARRETT'S.    Chest pain    a. 2022 Cath: nl cors; b. 2008 Neg myoview; c. 01/2014 Cath: Nl cors. EF 40%; c. 08/2020 NSTEMI/Cath [facility]): nl cors.   Chronic anticoagulation    Chronic combined systolic and diastolic CHF (congestive heart failure)    a. 10/2014 Echo: EF40%; b. 01/2015 Echo: EF 55-60%; c. 11/2020 Echo: EF 55-60%, mod LVH, sev BAE, mild MR, mod TR. PASP 93mmHg; d. 01/2021  Echo: EF 45-50%, glob HK, mod conc LVH, mod reduced RV fxn, RVSP 60.74mmHg, Mod TR, mild to mod MR, sev dil LA.   Chronic LBP    Surgical intervention in 1996   Diabetes mellitus, type 2    Insulin therapy; exacerbated by prednisone   Gastroparesis    99% retention 05/2008 on GES   GERD (gastroesophageal reflux disease)    Heart attack 08/13/2020   Hiatal hernia    Hyperlipidemia    Hypertension    Hypothyroid    IBS (irritable bowel syndrome)    Obesity    OSA on CPAP    had CPAP and cannot tolerate.   Paroxysmal atrial fibrillation    a.CHA2DS2VASc = 6-->eliquis. Also on Amio.   Pulmonary hypertension 01/2015   a. 01/2015 RHC: Predominantly pulmonary venous hypertension but may be component of PAH (PA mean 52, PCWP 31); b. 01/2021 Echo: RVSP 60.33mmHg w/ mod reduced RV fxn.   Seizures    last seizure was 2 years ago; on keppra for this; unknown etiology   Syncope    a. Admitted 05/2009; magnetic resonance imagin/ MRA - negative; etiology thought to be orthostasis secondary to drugs and dehydration. b. Syncope 02/2015 also felt 2/2 dehydration.    Past Surgical History:  Procedure Laterality Date   BACK SURGERY  1996   BIOPSY N/A 11/08/2013   Procedure: BIOPSY  / Tissue sampling / ulcers present in small intestine;  Surgeon: Danie Binder, MD;  [facility];  Service: Endoscopy;  Laterality: N/A;   CARDIAC CATHETERIZATION  2002   CARDIAC CATHETERIZATION N/A 01/26/2015   Procedure: Right Heart Cath;  Surgeon: Larey Dresser, MD;  Location: [place] CV LAB;  Service: Cardiovascular;  Laterality: N/A;   CARDIOVASCULAR STRESS TEST  2008   Stress nuclear study   CARDIOVERSION N/A 03/06/2015   Procedure: CARDIOVERSION;  Surgeon: Larey Dresser, MD;  Location: [place];  Service: Cardiovascular;  Laterality: N/A;   CARPAL TUNNEL RELEASE  1994   CATARACT EXTRACTION W/PHACO Right 01/10/2022   Procedure: CATARACT EXTRACTION PHACO AND INTRAOCULAR LENS PLACEMENT ([place]);   Surgeon: Baruch Goldmann, MD;  Location: [facility];  Service: Ophthalmology;  Laterality: Right;  CDE: 21.51   CATARACT EXTRACTION W/PHACO Left 01/24/2022   Procedure: CATARACT EXTRACTION PHACO AND INTRAOCULAR LENS PLACEMENT (I[facility]);  Surgeon: Baruch Goldmann, MD;  Location: [facility];  Service: Ophthalmology;  Laterality: Left;  CDE: 17.13   COLONOSCOPY  11/2011   Dr. Oneida Alar: Internal hemorrhoids, mild diverticulosis. Random colon biopsies negative.   COLONOSCOPY N/A 11/08/2013   SLF: Normal mucosa in the terminal ileum/The colon IS redundant/  Moderate diverticulosis throughout the entire colon. ileum bx benign. colon bx benign   CRYOTHERAPY  01/23/2021   Procedure: CRYOTHERAPY;  Surgeon: Candee Furbish, MD;  Location: [facility] ENDOSCOPY;  Service: Pulmonary;;   CRYOTHERAPY  01/24/2021   Procedure: Cydney Ok;  Surgeon: Candee Furbish, MD;  Location: [facility] ENDOSCOPY;  Service: Pulmonary;;   CRYOTHERAPY  01/26/2021   Procedure: CRYOTHERAPY;  Surgeon: Juanito Doom, MD;  Location: [facility] ENDOSCOPY;  Service: Cardiopulmonary;;   ESOPHAGOGASTRODUODENOSCOPY  2008   Barrett's without dysplasia. esphagus dilated. antral erosions, h.pylori serologies negative.   ESOPHAGOGASTRODUODENOSCOPY  11/2011   Dr. Oneida Alar: Barrett's esophagus, mild gastritis, diverticulum in the second portion of the duodenum repeat EGD 3 years. Small bowel biopsies negative. Gastric biopsy show reactive gastropathy but no H. pylori. Esophageal biopsies consistent with GERD. Next EGD 11/2014   ESOPHAGOGASTRODUODENOSCOPY N/A 11/21/2014   [id number] non-erosive gastritis/irregular z-line   ESOPHAGOGASTRODUODENOSCOPY  03/2022   FOREIGN BODY REMOVAL  01/23/2021   Procedure: FOREIGN BODY REMOVAL;  Surgeon: Candee Furbish, MD;  Location: [place];  Service: Pulmonary;;   FOREIGN BODY REMOVAL  01/24/2021   Procedure: FOREIGN BODY REMOVAL;  Surgeon: Candee Furbish, MD;  Location: [facility] ENDOSCOPY;  Service: Pulmonary;;   GIVENS CAPSULE STUDY   12/07/2011   Proximal small bowel, rare AVM. Distal small bowel, multiple ulcers noted   GIVENS CAPSULE STUDY N/A 09/27/2013   Distal small bowel ulcers extending to TI.   GIVENS CAPSULE STUDY N/A 10/10/2013   Procedure: GIVENS CAPSULE STUDY;  Surgeon: Danie Binder, MD;  [facility];  Service: Endoscopy;  Laterality: N/A;  7:30   HEMOSTASIS CONTROL  01/24/2021   Procedure: HEMOSTASIS CONTROL;  Surgeon: Candee Furbish, MD;  Location: [facility] ENDOSCOPY;  Service: Pulmonary;;   HEMOSTASIS CONTROL  01/26/2021   Procedure: HEMOSTASIS CONTROL;  Surgeon: Juanito Doom, MD;  Location: [place];  Service: Cardiopulmonary;;   IR GASTROSTOMY TUBE MOD SED  01/11/2021   IR GASTROSTOMY TUBE REMOVAL  04/13/2021   IR KYPHO THORACIC WITH BONE BIOPSY  02/09/2018   KNEE ARTHROSCOPY WITH MEDIAL MENISECTOMY Right 06/09/2016   Procedure: KNEE ARTHROSCOPY WITH MEDIAL MENISECTOMY;  Surgeon: Carole Civil, MD;  Location: [facility];  Service: Orthopedics;  Laterality: Right;  medial and lateral menisectomy   LAMINECTOMY  1995   L4-L5   LAPAROSCOPIC CHOLECYSTECTOMY  1990s   LEFT HEART CATHETERIZATION WITH CORONARY ANGIOGRAM  01/10/2014   Procedure: LEFT HEART CATHETERIZATION WITH CORONARY ANGIOGRAM;  Surgeon: Sinclair Grooms, MD;  Location: [facility] CATH LAB;  Service: Cardiovascular;;   MICROLARYNGOSCOPY WITH LASER AND BALLOON DILATION N/A 11/17/2021   Procedure: MICROLARYNGOSCOPY WITH CO2 LASER, SUBGLOTTIC LARYNGEAL MASS BIOSPY;  Surgeon: [name], MD;  Location: [place];  Service: ENT;  Laterality: N/A;   RIGHT HEART CATHETERIZATION N/A 01/10/2014   Procedure: RIGHT HEART CATH;  Surgeon: Sinclair Grooms, MD;  Location: [facility] CATH LAB;  Service: Cardiovascular;  Laterality: N/A;   TOTAL ABDOMINAL HYSTERECTOMY  1999   TRACHEOSTOMY REVISION N/A 07/18/2021   Procedure: TRACHEOSTOMY REVISION;  Surgeon: Melissa Montane, MD;  Location: [place];  Service: ENT;  Laterality: N/A;   TRACHEOSTOMY TUBE PLACEMENT  N/A 09/01/2017   Procedure: TRACHEOSTOMY;  Surgeon: Rozetta Nunnery, MD;  Location: [place];  Service: ENT;  Laterality: N/A;   VIDEO BRONCHOSCOPY Bilateral 01/23/2021   Procedure: VIDEO BRONCHOSCOPY WITHOUT FLUORO;  Surgeon: Candee Furbish, MD;  Location: [facility] ENDOSCOPY;  Service: Pulmonary;  Laterality: Bilateral;   VIDEO BRONCHOSCOPY Bilateral 01/24/2021   Procedure: VIDEO BRONCHOSCOPY WITHOUT FLUORO;  Surgeon: Candee Furbish, MD;  Location: [facility] ENDOSCOPY;  Service: Pulmonary;  Laterality: Bilateral;   VIDEO BRONCHOSCOPY N/A 01/26/2021   Procedure: VIDEO BRONCHOSCOPY WITHOUT FLUORO;  Surgeon: Juanito Doom, MD;  Location: [place];  Service: Cardiopulmonary;  Laterality: N/A;  Current Outpatient Medications  Medication Sig Dispense Refill   apixaban (ELIQUIS) 5 MG TABS tablet Take 1 tablet (5 mg total) by mouth 2 (two) times daily.     atorvastatin (LIPITOR) 80 MG tablet Take 1 tablet (80 mg total) by mouth daily.     Cholecalciferol (VITAMIN D3) 50 MCG (2000 UT) capsule Take 2,000 Units by mouth daily.     dicyclomine (BENTYL) 10 MG capsule TAKE 1 CAPSULE BY MOUTH UP TO FOUR TIMES DAILY BEFORE MEALS AND AT BEDTIME (STOP IF CONSTIPATION) 120 capsule 1   donepezil (ARICEPT) 5 MG tablet Take 1 tablet (5 mg total) by mouth at bedtime.     DULoxetine (CYMBALTA) 30 MG capsule Take 1 capsule (30 mg total) by mouth daily. (Patient taking differently: Take 30 mg by mouth 2 (two) times daily.)  3   furosemide (LASIX) 40 MG tablet Take 1 tablet (40 mg total) by mouth daily. May take an additional 20 mg ( 0.5 tablet) as needed for swelling. 90 tablet 3   gabapentin (NEURONTIN) 100 MG capsule Take 1 capsule (100 mg total) by mouth 3 (three) times daily. 90 capsule 3   insulin glargine (LANTUS) 100 UNIT/ML injection Inject 0.15 mLs (15 Units total) into the skin daily. (Patient taking differently: Inject 20-24 Units into the skin at bedtime.) 10 mL 11   ipratropium-albuterol (DUONEB) 0.5-2.5  (3) MG/3ML SOLN Take 3 mLs by nebulization every 4 (four) hours as needed (Asthma).     levETIRAcetam (KEPPRA) 500 MG tablet Take 500 mg by mouth 2 (two) times daily.     levothyroxine (SYNTHROID) 175 MCG tablet Take 1 tablet (175 mcg total) by mouth daily at 6 (six) AM.     loperamide (IMODIUM) 2 MG capsule Take by mouth.     LORazepam (ATIVAN) 1 MG tablet Take 1 mg by mouth at bedtime.     melatonin 3 MG TABS tablet Take 1 tablet (3 mg total) by mouth at bedtime as needed.  0   metoprolol tartrate (LOPRESSOR) 25 MG tablet Take 1 tablet (25 mg total) by mouth 2 (two) times daily.     Multiple Vitamins-Minerals (MULTIVITAMIN WITH MINERALS) tablet Take 1 tablet by mouth daily. For Diabetics     nitroGLYCERIN (NITROSTAT) 0.4 MG SL tablet TAKE 1 TABLET UNDER THE TONGUE FOR CHEST PAIN, REPEAT TWO TIMES AS NEEDED BUT IF PAIN PERSISTS GET MEDICAL ATTENTION     NOVOLIN R FLEXPEN RELION 100 UNIT/ML FlexPen Inject 2-5 Units into the skin 3 (three) times daily before meals. Per sliding scale     pantoprazole (PROTONIX) 40 MG tablet Take 1 tablet (40 mg total) by mouth 2 (two) times daily before a meal. 60 tablet 3   potassium chloride (KLOR-CON) 10 MEQ tablet Take 20 mEq by mouth daily.     sertraline (ZOLOFT) 25 MG tablet Take 1 tablet (25 mg total) by mouth daily.     traMADol (ULTRAM) 50 MG tablet Take by mouth.     trolamine salicylate (ASPERCREME) 10 % cream Apply 1 application. topically as needed (knee pain).     vitamin B-12 (CYANOCOBALAMIN) 1000 MCG tablet Take 1,000 mcg by mouth daily.     No current facility-administered medications for this visit.    Allergies as of 10/05/2022 - Review Complete 10/05/2022  Allergen Reaction Noted   Celexa [citalopram hydrobromide] Other (See Comments) 06/30/2013   Codeine Nausea And Vomiting and Other (See Comments) 11/28/2019   Dilaudid [hydromorphone hcl] Other (See Comments) 12/31/2015   Reglan [metoclopramide]  Other (See Comments) 06/04/2014    Hyoscyamine Other (See Comments) 09/05/2019    Family History  Problem Relation Age of Onset   Hypertension Mother    Alzheimer's disease Mother    Stroke Mother    Heart attack Mother    Breast cancer Sister    Hypertension Other    Heart disease Neg Hx    Colon cancer Neg Hx    Liver disease Neg Hx     Social History   Socioeconomic History   Marital status: Married    Spouse name: Not on file   Number of children: Not on file   Years of education: Not on file   Highest education level: Not on file  Occupational History   Occupation: Museum/gallery curator at [employer]    Employer: [employer]: disabled    Employer: RETIRED  Tobacco Use   Smoking status: Former    Packs/day: 0.25    Years: 15.00    Additional pack years: 0.00    Total pack years: 3.75    Types: Cigarettes    Start date: 02/26/1966    Quit date: 07/01/1983    Years since quitting: 39.2   Smokeless tobacco: Never   Tobacco comments:    quit in 1984  Vaping Use   Vaping Use: Never used  Substance and Sexual Activity   Alcohol use: Not Currently    Comment: Occasional alcohol 30 years ago (09/02/21)   Drug use: No   Sexual activity: Yes    Birth control/protection: None, Surgical  Other Topics Concern   Not on file  Social History Narrative   Sedentary   4 children, "blended family"   Social Determinants of Health   Financial Resource Strain: Not on file  Food Insecurity: Not on file  Transportation Needs: Not on file  Physical Activity: Not on file  Stress: Not on file  Social Connections: Not on file    Review of Systems: Gen: Denies fever, chills, cold or flulike symptoms, presyncope, syncope. CV: Denies chest pain, palpitations. Resp: Denies dyspnea at rest, cough.  Remains on a ventilator at night. GI: See HPI. Heme: See HPI  Physical Exam: BP 125/77 (BP Location: Left Arm, Patient Position: Sitting, Cuff Size: Normal)   Pulse 80   Temp (!) 97.4 F (36.3 C) (Temporal)   Ht 5\' 6"   (1.676 m)   Wt 179 lb 9.6 oz (81.5 kg)   SpO2 97%   BMI 28.99 kg/m  General:   Alert and oriented. No distress noted. Pleasant and cooperative.  Has tracheostomy. Head:  Normocephalic and atraumatic. Eyes:  Conjuctiva clear without scleral icterus. Heart:  S1, S2 present without murmurs appreciated. Lungs:  Clear to auscultation bilaterally. No wheezes, rales, or rhonchi. No distress.  Abdomen:  +BS, soft, non-tender and non-distended. No rebound or guarding. No HSM or masses noted. Msk:  Symmetrical without gross deformities. Normal posture. Extremities:  Without edema. Neurologic:  Alert and  oriented x4 Psych:  Normal mood and affect.    Assessment:  72 y.o. female  with history of CHF, restrictive lung disease, hospitalized in June 2022 with respiratory failure and required tracheostomy, IDA previously following with hematology and receiving IV iron due to oral iron intolerance,  IBS with diarrhea, chronic lower abdominal pain worsened postprandially with negative mesenteric [place] in 2021, gastroparesis, GERD, presenting today for routine follow-up.   GERD:  Well-controlled on pantoprazole 40 mg twice daily.  Alternating constipation and diarrhea: History of IBS-D, but reporting  alternating constipation and diarrhea at this time.  Overall, it sounds that her bowel function is fairly stable with significant constipation or significant diarrhea being fairly infrequent.  She is reporting some intermittent fecal seepage.  Query whether she is more on the constipated side with some overflow diarrhea/incomplete emptying contributing to fecal seepage.  We will have her complete an abdominal x-ray to evaluate for stool burden and add Benefiber daily.  Otherwise, she will continue dicyclomine 3 times daily and Imodium as needed for now.  Rectal bleeding: Single episode of rectal bleeding a few weeks ago in the setting of significant constipation and sitting on the toilet for 15 minutes.  Reports  she has hemorrhoids.  Would like to hold off on colonoscopy, last colonoscopy in 2015.  Recommended adding Benefiber daily, using Anusol rectal cream for possible hemorrhoids, and monitor for recurrent bleeding.  May need to consider updating colonoscopy if recurrent symptoms.  Anemia: Chronic.  Baseline hemoglobin in the 9 range.  Previously followed with hematology and would like a referral back to them as she has not seen them in quite some time.  Most recent labs 09/14/2022 with hemoglobin stable at 9.4, iron panel low normal.  She reports single episode of rectal bleeding a few weeks ago possibly secondary to hemorrhoids in setting of constipation.  No recurrent symptoms.  No melena.  Prefers to hold off on colonoscopy.  Last GI evaluation in 2015/2016 with nonerosive gastritis, pancolonic diverticulosis, idiopathic small bowel ulcers present since 2013.  As patient requested, we will hold off on endoscopic evaluation unless she has recurrent overt GI bleeding.  We will go ahead and refer her back to hematology.   Plan:  Abdominal x-ray. Start Benefiber 2 teaspoons daily x 2 weeks, then increase to twice daily. Continue dicyclomine 10 mg 3 times daily before meals. Continue Imodium as needed.  Recommend she not take Imodium as she has 3 or more diarrhea type stools in 1 day. Continue pantoprazole 40 mg twice daily. Anusol rectal cream twice daily as needed. Refer to hematology. Follow-up in 3 months or sooner if needed.   Aliene Altes, PA-C [facility] Gastroenterology 10/05/2022

## 2022-10-05 ENCOUNTER — Encounter: Payer: Self-pay | Admitting: Gastroenterology

## 2022-10-05 ENCOUNTER — Other Ambulatory Visit: Payer: Self-pay | Admitting: *Deleted

## 2022-10-05 ENCOUNTER — Ambulatory Visit (INDEPENDENT_AMBULATORY_CARE_PROVIDER_SITE_OTHER): Payer: HMO | Admitting: Gastroenterology

## 2022-10-05 VITALS — BP 125/77 | HR 80 | Temp 97.4°F | Ht 66.0 in | Wt 179.6 lb

## 2022-10-05 DIAGNOSIS — K219 Gastro-esophageal reflux disease without esophagitis: Secondary | ICD-10-CM

## 2022-10-05 DIAGNOSIS — R198 Other specified symptoms and signs involving the digestive system and abdomen: Secondary | ICD-10-CM | POA: Diagnosis not present

## 2022-10-05 DIAGNOSIS — D509 Iron deficiency anemia, unspecified: Secondary | ICD-10-CM

## 2022-10-05 NOTE — Patient Instructions (Signed)
Please have x-ray of your abdomen completed at [place] 2 teaspoons daily x 2 weeks, then increase to twice daily.  Continue dicyclomine 10 mg 3 times daily for now.  Continue to use Imodium as needed for diarrhea.  I recommend that you do not take Imodium unless you have had 3 or more diarrhea type bowel movements in 1 day.  Continue pantoprazole 40 mg twice daily.  We will follow-up with you in about 3 months or sooner if needed.  Aliene Altes, PA-C [facility] Gastroenterology

## 2022-10-10 NOTE — Progress Notes (Signed)
[facility] [address], [place] [address]   Clinic Day:  10/11/2022  Referring physician: Letta Median, PA-C  Patient Care Team: Gareth Morgan, MD as PCP - General (Family Medicine) Wyline Mood Dorothe Pea, MD as PCP - Cardiology (Cardiology) West Bali, MD (Inactive) as Consulting Physician (Gastroenterology) Galen Manila, Novella Olive, MD (Inactive) as Consulting Physician (Hematology and Oncology) Lanelle Bal, DO as Consulting Physician (Internal Medicine)   ASSESSMENT & PLAN:   Assessment:  1.  Normocytic anemia: - She had a history of iron deficiency anemia in the past, status post Injectafer, last given in November 2019. - She had 2 units of blood transfusion during recent hospitalization. - She had tracheotomy done as she cannot tolerate CPAP machine for sleep apnea. - She had 1 episode of rectal bleeding recently secondary to constipation.  Otherwise no bleeding per rectum or melena.  No ice pica.  2.  Social/family history: - She is seen with her daughter [name] who is her caregiver.  Patient walks with the help of walker.  She is able to dress herself.  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  Quit smoking in 1994. - Sister had breast cancer.  Another sister had leukemia.  Maternal uncle had stomach cancer.  Plan:  1.  Normocytic anemia: - Recommend repeating a CBC, evaluate for nutritional deficiencies including ferritin and iron panel, check for hemolysis and bone marrow infiltrative process. - Would also recommend checking creatinine to see if she has additional component of CKD as her creatinine was elevated in January 2023. - If she has low ferritin and iron panel, will consider parenteral iron therapy.  2.  Health maintenance: - Physical exam today was positive for small left axillary lymph node. - She did not have a mammogram in the  last few years. - Will schedule her for screening mammogram.  Will follow-up on the axillary lymph node at next visit.   Orders Placed This Encounter  Procedures   MM 3D SCREENING MAMMOGRAM BILATERAL BREAST    NAS No implants No medical devices per office Sch'd appt with office on 10/11/22-kjones Pt has a trach-per mary ok to sch 10/11/22-kjones    Standing Status:   Future    Standing Expiration Date:   10/11/2023    Order Specific Question:   Reason for Exam (SYMPTOM  OR DIAGNOSIS REQUIRED)    Answer:   screen    Order Specific Question:   Preferred imaging location?    Answer:   [facility]   Iron and TIBC ([facility] ONLY)    Standing Status:   Future    Number of Occurrences:   1    Standing Expiration Date:   10/11/2023   Ferritin    Standing Status:   Future    Number of Occurrences:   1    Standing Expiration Date:   10/11/2023   Vitamin B12    Standing Status:   Future    Number of Occurrences:   1    Standing Expiration Date:   10/11/2023   Folate    Standing Status:   Future    Number of Occurrences:   1    Standing Expiration Date:   10/11/2023   Reticulocytes    Standing Status:   Future    Number of Occurrences:   1    Standing Expiration Date:  10/11/2023   Methylmalonic acid, serum    Standing Status:   Future    Number of Occurrences:   1    Standing Expiration Date:   10/11/2023   Lactate dehydrogenase    Standing Status:   Future    Number of Occurrences:   1    Standing Expiration Date:   10/11/2023   CBC with Differential    Standing Status:   Future    Number of Occurrences:   1    Standing Expiration Date:   10/11/2023   Comprehensive metabolic panel    Standing Status:   Future    Number of Occurrences:   1    Standing Expiration Date:   10/11/2023   Copper, serum    Standing Status:   Future    Number of Occurrences:   1    Standing Expiration Date:   10/11/2023   Kappa/lambda light chains    Standing Status:   Future    Number of  Occurrences:   1    Standing Expiration Date:   10/11/2023   Immunofixation electrophoresis    Standing Status:   Future    Number of Occurrences:   1    Standing Expiration Date:   10/11/2023   Protein electrophoresis, serum    Standing Status:   Future    Number of Occurrences:   1    Standing Expiration Date:   10/11/2023   Direct antiglobulin test    Standing Status:   Future    Number of Occurrences:   1    Standing Expiration Date:   10/11/2023      I,Katie Daubenspeck,acting as a scribe for [name] Jasara Corrigan, MD.,have documented all relevant documentation on the behalf of [name] Tara Weinheimer, MD,as directed by  [name] Fay Swider, MD while in the presence of [name] Tara Owczarzak, MD.   I, [name] Cathey Fredenburg MD, have reviewed the above documentation for accuracy and completeness, and I agree with the above.   [name] Sharen Youngren, MD   4/9/20244:34 PM  CHIEF COMPLAINT/PURPOSE OF CONSULT:   Diagnosis: iron deficiency anemia  Current Therapy: Under workup  HISTORY OF PRESENT ILLNESS:   [name] is a 72 y.o. female presenting to clinic today for evaluation of iron deficiency anemia at the request of [name][facility]. [facility], last seen in 05/2018 by Dr. Melton AlarHiggs. She has previously received IV iron infusions with feraheme in 01/2015 and 01/2016, ferrlecit in 03/2015 and 07/2015, and injectafer in 05/2018.  Today, she states that she is doing well overall. Her appetite level is at 75%. Her energy level is at 50%.  Her most recent labs from 09/15/22 showed: iron 54, Hg 9.4, ferritin 25, vit B12 >2000. Her last colonoscopy was 2015. She is followed by GI for GERD and IBS.  PAST MEDICAL HISTORY:   Past Medical History: Past Medical History:  Diagnosis Date   Abnormal pulmonary function test    Anemia    H/H of 10/30 with a normal MCV in 12/09   Anxiety    Arthritis    Barrett's esophagus    Diagnosed 1995. Last EGD 2016-NO BARRETT'S.     Chest pain    a. 2022 Cath: nl cors; b. 2008 Neg myoview; c. 01/2014 Cath: Nl cors. EF 40%; c. 08/2020 NSTEMI/Cath [facility] nl cors.   Chronic anticoagulation    Chronic combined systolic and diastolic CHF (congestive heart failure)    a. 10/2014 Echo: EF40%; b. 01/2015 Echo: EF  55-60%; c. 11/2020 Echo: EF 55-60%, mod LVH, sev BAE, mild MR, mod TR. PASP ; d. 01/2021 Echo: EF 45-50%, glob HK, mod conc LVH, mod reduced RV fxn, RVSP 60.80mmHg, Mod TR, mild to mod MR, sev dil LA.   Chronic LBP    Surgical intervention in 1996   Diabetes mellitus, type 2    Insulin therapy; exacerbated by prednisone   Gastroparesis    99% retention 05/2008 on GES   GERD (gastroesophageal reflux disease)    Heart attack 08/13/2020   Hiatal hernia    Hyperlipidemia    Hypertension    Hypothyroid    IBS (irritable bowel syndrome)    Obesity    OSA on CPAP    had CPAP and cannot tolerate.   Paroxysmal atrial fibrillation    a.CHA2DS2VASc = 6-->eliquis. Also on Amio.   Pulmonary hypertension 01/2015   a. 01/2015 RHC: Predominantly pulmonary venous hypertension but may be component of PAH (PA mean 52, PCWP 31); b. 01/2021 Echo: RVSP 60.21mmHg w/ mod reduced RV fxn.   Seizures    last seizure was 2 years ago; on keppra for this; unknown etiology   Syncope    a. Admitted 05/2009; magnetic resonance imagin/ MRA - negative; etiology thought to be orthostasis secondary to drugs and dehydration. b. Syncope 02/2015 also felt 2/2 dehydration.    Surgical History: Past Surgical History:  Procedure Laterality Date   BACK SURGERY  1996   BIOPSY N/A 11/08/2013   Procedure: BIOPSY  / Tissue sampling / ulcers present in small intestine;  Surgeon: West Bali, MD;  [facility];  Service: Endoscopy;  Laterality: N/A;   CARDIAC CATHETERIZATION  2002   CARDIAC CATHETERIZATION N/A 01/26/2015   Procedure: Right Heart Cath;  Surgeon: Laurey Morale, MD;  Location: [facility] INVASIVE CV LAB;  Service: Cardiovascular;   Laterality: N/A;   CARDIOVASCULAR STRESS TEST  2008   Stress nuclear study   CARDIOVERSION N/A 03/06/2015   Procedure: CARDIOVERSION;  Surgeon: Laurey Morale, MD;  Location: [facility] ENDOSCOPY;  Service: Cardiovascular;  Laterality: N/A;   CARPAL TUNNEL RELEASE  1994   CATARACT EXTRACTION W/PHACO Right 01/10/2022   Procedure: CATARACT EXTRACTION PHACO AND INTRAOCULAR LENS PLACEMENT (IOC);  Surgeon: [name], MD;  Location: [facility] ORS;  Service: Ophthalmology;  Laterality: Right;  CDE: 21.51   CATARACT EXTRACTION W/PHACO Left 01/24/2022   Procedure: CATARACT EXTRACTION PHACO AND INTRAOCULAR LENS PLACEMENT (IOC);  Surgeon: [name], MD;  Location: [facility] ORS;  Service: Ophthalmology;  Laterality: Left;  CDE: 17.13   COLONOSCOPY  11/2011   Dr. Darrick Penna: Internal hemorrhoids, mild diverticulosis. Random colon biopsies negative.   COLONOSCOPY N/A 11/08/2013   SLF: Normal mucosa in the terminal ileum/The colon IS redundant/  Moderate diverticulosis throughout the entire colon. ileum bx benign. colon bx benign   CRYOTHERAPY  01/23/2021   Procedure: CRYOTHERAPY;  Surgeon: Lorin Glass, MD;  Location: [facility] ENDOSCOPY;  Service: Pulmonary;;   CRYOTHERAPY  01/24/2021   Procedure: Cloyde Reams;  Surgeon: Lorin Glass, MD;  Location: [facility] ENDOSCOPY;  Service: Pulmonary;;   CRYOTHERAPY  01/26/2021   Procedure: Cloyde Reams;  Surgeon: Lupita Leash, MD;  Location: [facility] ENDOSCOPY;  Service: Cardiopulmonary;;   ESOPHAGOGASTRODUODENOSCOPY  2008   Barrett's without dysplasia. esphagus dilated. antral erosions, h.pylori serologies negative.   ESOPHAGOGASTRODUODENOSCOPY  11/2011   Dr. Darrick Penna: Barrett's esophagus, mild gastritis, diverticulum in the second portion of the duodenum repeat EGD 3 years. Small bowel biopsies negative. Gastric biopsy show reactive gastropathy  but no H. pylori. Esophageal biopsies consistent with GERD. Next EGD 11/2014   ESOPHAGOGASTRODUODENOSCOPY N/A 11/21/2014   JJO:ACZY non-erosive  gastritis/irregular z-line   ESOPHAGOGASTRODUODENOSCOPY  03/2022   FOREIGN BODY REMOVAL  01/23/2021   Procedure: FOREIGN BODY REMOVAL;  Surgeon: Lorin Glass, MD;  Location: [facility] ENDOSCOPY;  Service: Pulmonary;;   FOREIGN BODY REMOVAL  01/24/2021   Procedure: FOREIGN BODY REMOVAL;  Surgeon: Lorin Glass, MD;  Location: [facility] ENDOSCOPY;  Service: Pulmonary;;   GIVENS CAPSULE STUDY  12/07/2011   Proximal small bowel, rare AVM. Distal small bowel, multiple ulcers noted   GIVENS CAPSULE STUDY N/A 09/27/2013   Distal small bowel ulcers extending to TI.   GIVENS CAPSULE STUDY N/A 10/10/2013   Procedure: GIVENS CAPSULE STUDY;  Surgeon: West Bali, MD;  [facility];  Service: Endoscopy;  Laterality: N/A;  7:30   HEMOSTASIS CONTROL  01/24/2021   Procedure: HEMOSTASIS CONTROL;  Surgeon: Lorin Glass, MD;  Location: [facility] ENDOSCOPY;  Service: Pulmonary;;   HEMOSTASIS CONTROL  01/26/2021   Procedure: HEMOSTASIS CONTROL;  Surgeon: Lupita Leash, MD;  Location: [facility] ENDOSCOPY;  Service: Cardiopulmonary;;   IR GASTROSTOMY TUBE MOD SED  01/11/2021   IR GASTROSTOMY TUBE REMOVAL  04/13/2021   IR KYPHO THORACIC WITH BONE BIOPSY  02/09/2018   KNEE ARTHROSCOPY WITH MEDIAL MENISECTOMY Right 06/09/2016   Procedure: KNEE ARTHROSCOPY WITH MEDIAL MENISECTOMY;  Surgeon: Vickki Hearing, MD;  Location: [facility] ORS;  Service: Orthopedics;  Laterality: Right;  medial and lateral menisectomy   LAMINECTOMY  1995   L4-L5   LAPAROSCOPIC CHOLECYSTECTOMY  1990s   LEFT HEART CATHETERIZATION WITH CORONARY ANGIOGRAM  01/10/2014   Procedure: LEFT HEART CATHETERIZATION WITH CORONARY ANGIOGRAM;  Surgeon: Lesleigh Noe, MD;  Location: [facility] CATH LAB;  Service: Cardiovascular;;   MICROLARYNGOSCOPY WITH LASER AND BALLOON DILATION N/A 11/17/2021   Procedure: MICROLARYNGOSCOPY WITH CO2 LASER, SUBGLOTTIC LARYNGEAL MASS BIOSPY;  Surgeon: Serena Colonel, MD;  Location: [facility] OR;  Service: ENT;  Laterality: N/A;   RIGHT  HEART CATHETERIZATION N/A 01/10/2014   Procedure: RIGHT HEART CATH;  Surgeon: Lesleigh Noe, MD;  Location: [facility] CATH LAB;  Service: Cardiovascular;  Laterality: N/A;   TOTAL ABDOMINAL HYSTERECTOMY  1999   TRACHEOSTOMY REVISION N/A 07/18/2021   Procedure: TRACHEOSTOMY REVISION;  Surgeon: Suzanna Obey, MD;  Location: [facility] OR;  Service: ENT;  Laterality: N/A;   TRACHEOSTOMY TUBE PLACEMENT N/A 09/01/2017   Procedure: TRACHEOSTOMY;  Surgeon: Drema Halon, MD;  Location: [facility] OR;  Service: ENT;  Laterality: N/A;   VIDEO BRONCHOSCOPY Bilateral 01/23/2021   Procedure: VIDEO BRONCHOSCOPY WITHOUT FLUORO;  Surgeon: Lorin Glass, MD;  Location: [facility] ENDOSCOPY;  Service: Pulmonary;  Laterality: Bilateral;   VIDEO BRONCHOSCOPY Bilateral 01/24/2021   Procedure: VIDEO BRONCHOSCOPY WITHOUT FLUORO;  Surgeon: Lorin Glass, MD;  Location: [facility] ENDOSCOPY;  Service: Pulmonary;  Laterality: Bilateral;   VIDEO BRONCHOSCOPY N/A 01/26/2021   Procedure: VIDEO BRONCHOSCOPY WITHOUT FLUORO;  Surgeon: Lupita Leash, MD;  Location: [facility] ENDOSCOPY;  Service: Cardiopulmonary;  Laterality: N/A;    Social History: Social History   Socioeconomic History   Marital status: Married    Spouse name: Not on file   Number of children: Not on file   Years of education: Not on file   Highest education level: Not on file  Occupational History   Occupation: Passenger transport manager at [employer]    Employer: [employer]    Comment: disabled    Employer: RETIRED  Tobacco  Use   Smoking status: Former    Packs/day: 0.25    Years: 15.00    Additional pack years: 0.00    Total pack years: 3.75    Types: Cigarettes    Start date: 02/26/1966    Quit date: 06/30/1993    Years since quitting: 29.3   Smokeless tobacco: Never   Tobacco comments:    quit in 1984  Vaping Use   Vaping Use: Never used  Substance and Sexual Activity   Alcohol use: Not Currently    Comment: Occasional alcohol 30 years ago (09/02/21)   Drug use: No   Sexual  activity: Yes    Birth control/protection: None, Surgical  Other Topics Concern   Not on file  Social History Narrative   Sedentary   4 children, "blended family"   Social Determinants of Health   Financial Resource Strain: Not on file  Food Insecurity: No Food Insecurity (10/11/2022)   Hunger Vital Sign    Worried About Running Out of Food in the Last Year: Never true    Ran Out of Food in the Last Year: Never true  Transportation Needs: No Transportation Needs (10/11/2022)   PRAPARE - Administrator, Civil Service (Medical): No    Lack of Transportation (Non-Medical): No  Physical Activity: Not on file  Stress: Not on file  Social Connections: Not on file  Intimate Partner Violence: Not At Risk (10/11/2022)   Humiliation, Afraid, Rape, and Kick questionnaire    Fear of Current or Ex-Partner: No    Emotionally Abused: No    Physically Abused: No    Sexually Abused: No    Family History: Family History  Problem Relation Age of Onset   Hypertension Mother    Alzheimer's disease Mother    Stroke Mother    Heart attack Mother    Breast cancer Sister    Hypertension Other    Heart disease Neg Hx    Colon cancer Neg Hx    Liver disease Neg Hx     Current Medications:  Current Outpatient Medications:    apixaban (ELIQUIS) 5 MG TABS tablet, Take 1 tablet (5 mg total) by mouth 2 (two) times daily., Disp: , Rfl:    atorvastatin (LIPITOR) 80 MG tablet, Take 1 tablet (80 mg total) by mouth daily., Disp: , Rfl:    Cholecalciferol (VITAMIN D3) 50 MCG (2000 UT) capsule, Take 2,000 Units by mouth daily., Disp: , Rfl:    dicyclomine (BENTYL) 10 MG capsule, TAKE 1 CAPSULE BY MOUTH UP TO FOUR TIMES DAILY BEFORE MEALS AND AT BEDTIME (STOP IF CONSTIPATION), Disp: 120 capsule, Rfl: 1   donepezil (ARICEPT) 5 MG tablet, Take 1 tablet (5 mg total) by mouth at bedtime., Disp: , Rfl:    DULoxetine (CYMBALTA) 30 MG capsule, Take 1 capsule (30 mg total) by mouth daily. (Patient taking  differently: Take 30 mg by mouth 2 (two) times daily.), Disp: , Rfl: 3   furosemide (LASIX) 40 MG tablet, Take 1 tablet (40 mg total) by mouth daily. May take an additional 20 mg ( 0.5 tablet) as needed for swelling., Disp: 90 tablet, Rfl: 3   gabapentin (NEURONTIN) 100 MG capsule, Take 1 capsule (100 mg total) by mouth 3 (three) times daily., Disp: 90 capsule, Rfl: 3   insulin glargine (LANTUS) 100 UNIT/ML injection, Inject 0.15 mLs (15 Units total) into the skin daily. (Patient taking differently: Inject 20-24 Units into the skin at bedtime.), Disp: 10 mL, Rfl: 11  ipratropium-albuterol (DUONEB) 0.5-2.5 (3) MG/3ML SOLN, Take 3 mLs by nebulization every 4 (four) hours as needed (Asthma)., Disp: , Rfl:    levETIRAcetam (KEPPRA) 500 MG tablet, Take 500 mg by mouth 2 (two) times daily., Disp: , Rfl:    levothyroxine (SYNTHROID) 175 MCG tablet, Take 1 tablet (175 mcg total) by mouth daily at 6 (six) AM., Disp: , Rfl:    loperamide (IMODIUM) 2 MG capsule, Take by mouth., Disp: , Rfl:    LORazepam (ATIVAN) 1 MG tablet, Take 1 mg by mouth at bedtime., Disp: , Rfl:    melatonin 3 MG TABS tablet, Take 1 tablet (3 mg total) by mouth at bedtime as needed., Disp: , Rfl: 0   metoprolol tartrate (LOPRESSOR) 25 MG tablet, Take 1 tablet (25 mg total) by mouth 2 (two) times daily., Disp: , Rfl:    Multiple Vitamins-Minerals (MULTIVITAMIN WITH MINERALS) tablet, Take 1 tablet by mouth daily. For Diabetics, Disp: , Rfl:    NOVOLIN R FLEXPEN RELION 100 UNIT/ML FlexPen, Inject 2-5 Units into the skin 3 (three) times daily before meals. Per sliding scale, Disp: , Rfl:    pantoprazole (PROTONIX) 40 MG tablet, Take 1 tablet (40 mg total) by mouth 2 (two) times daily before a meal., Disp: 60 tablet, Rfl: 3   potassium chloride (KLOR-CON) 10 MEQ tablet, Take 20 mEq by mouth daily., Disp: , Rfl:    sertraline (ZOLOFT) 25 MG tablet, Take 1 tablet (25 mg total) by mouth daily., Disp: , Rfl:    traMADol (ULTRAM) 50 MG tablet,  Take by mouth., Disp: , Rfl:    trolamine salicylate (ASPERCREME) 10 % cream, Apply 1 application. topically as needed (knee pain)., Disp: , Rfl:    vitamin B-12 (CYANOCOBALAMIN) 1000 MCG tablet, Take 1,000 mcg by mouth daily., Disp: , Rfl:    nitroGLYCERIN (NITROSTAT) 0.4 MG SL tablet, TAKE 1 TABLET UNDER THE TONGUE FOR CHEST PAIN, REPEAT TWO TIMES AS NEEDED BUT IF PAIN PERSISTS GET MEDICAL ATTENTION (Patient not taking: Reported on 10/11/2022), Disp: , Rfl:    Allergies: Allergies  Allergen Reactions   Celexa [Citalopram Hydrobromide] Other (See Comments)    Dyskinesia    Codeine Nausea And Vomiting and Other (See Comments)    HALLUCINATIONS     Dilaudid [Hydromorphone Hcl] Other (See Comments)    Made her pass out    Reglan [Metoclopramide] Other (See Comments)    DYSKINESIA    Hyoscyamine Other (See Comments)    MADE DIARRHEA WORSE    REVIEW OF SYSTEMS:   Review of Systems  Constitutional:  Negative for chills, fatigue and fever.  HENT:   Negative for lump/mass, mouth sores, nosebleeds, sore throat and trouble swallowing.   Eyes:  Negative for eye problems.  Respiratory:  Positive for shortness of breath. Negative for cough.   Cardiovascular:  Positive for palpitations. Negative for chest pain and leg swelling.  Gastrointestinal:  Positive for constipation. Negative for abdominal pain, diarrhea, nausea and vomiting.  Genitourinary:  Negative for bladder incontinence, difficulty urinating, dysuria, frequency, hematuria and nocturia.   Musculoskeletal:  Negative for arthralgias, back pain, flank pain, myalgias and neck pain.  Skin:  Negative for itching and rash.  Neurological:  Positive for dizziness, headaches and numbness.  Hematological:  Does not bruise/bleed easily.  Psychiatric/Behavioral:  Negative for depression, sleep disturbance and suicidal ideas. The patient is not nervous/anxious.   All other systems reviewed and are negative.    VITALS:   Blood pressure  100/82, pulse 92, temperature 98.2  F (36.8 C), temperature source Oral, resp. rate 18, height  (1.676 m), weight 179 lb (81.2 kg), SpO2 100 %.  Wt Readings from Last 3 Encounters:  10/11/22 179 lb (81.2 kg)  10/05/22 179 lb 9.6 oz (81.5 kg)  09/27/22 182 lb (82.6 kg)    Body mass index is 28.89 kg/m.   PHYSICAL EXAM:   Physical Exam Vitals and nursing note reviewed. Exam conducted with a chaperone present.  Constitutional:      Appearance: Normal appearance.  Cardiovascular:     Rate and Rhythm: Normal rate. Rhythm irregular.     Pulses: Normal pulses.     Heart sounds: Normal heart sounds.  Pulmonary:     Effort: Pulmonary effort is normal.     Breath sounds: Normal breath sounds.  Abdominal:     Palpations: Abdomen is soft. There is no hepatomegaly, splenomegaly or mass.     Tenderness: There is no abdominal tenderness.  Musculoskeletal:     Right lower leg: No edema.     Left lower leg: No edema.  Lymphadenopathy:     Cervical: No cervical adenopathy.     Right cervical: No superficial, deep or posterior cervical adenopathy.    Left cervical: No superficial, deep or posterior cervical adenopathy.     Upper Body:     Right upper body: No supraclavicular or axillary adenopathy.     Left upper body: Axillary adenopathy present. No supraclavicular adenopathy.  Neurological:     General: No focal deficit present.     Mental Status: She is alert and oriented to person, place, and time.  Psychiatric:        Mood and Affect: Mood normal.        Behavior: Behavior normal.     LABS:      Latest Ref Rng & Units 10/11/2022    2:30 PM 11/17/2021    7:23 AM 07/19/2021    4:17 AM  CBC  WBC 4.0 - 10.5 K/uL 7.2  10.6  8.6   Hemoglobin 12.0 - 15.0 g/dL 16.1  9.7  9.0   Hematocrit 36.0 - 46.0 % 32.8  30.8  29.3   Platelets 150 - 400 K/uL 182  198  148       Latest Ref Rng & Units 10/11/2022    2:30 PM 11/17/2021    7:23 AM 07/19/2021    4:17 AM  CMP  Glucose 70 - 99  mg/dL 096  045  88   BUN 8 - 23 mg/dL 40  18  23   Creatinine 0.44 - 1.00 mg/dL 4.09  8.11  9.14   Sodium 135 - 145 mmol/L 138  140  142   Potassium 3.5 - 5.1 mmol/L 4.2  3.8  3.9   Chloride 98 - 111 mmol/L 107  111  113   CO2 22 - 32 mmol/L Calcium 8.9 - 10.3 mg/dL 9.2  9.3  9.1   Total Protein 6.5 - 8.1 g/dL 7.1     Total Bilirubin 0.3 - 1.2 mg/dL 0.7     Alkaline Phos 38 - 126 U/L 83     AST 15 - 41 U/L 22     ALT 0 - 44 U/L 20        No results found for: "CEA1", "CEA" / No results found for: "CEA1", "CEA" No results found for: "PSA1" No results found for: "NWG956" No results found for: "OZH086"  Lab Results  Component  Value Date   TOTALPROTELP 6.5 05/11/2018   ALBUMINELP 3.4 05/11/2018   A1GS 0.2 05/11/2018   A2GS 1.0 05/11/2018   BETS 1.0 05/11/2018   GAMS 0.9 05/11/2018   MSPIKE Not Observed 05/11/2018   SPEI Comment 05/11/2018   Lab Results  Component Value Date   TIBC 260 04/08/2020   TIBC 221 (L) 08/18/2017   TIBC 344 02/13/2017   FERRITIN 58 04/08/2020   FERRITIN 131 05/08/2019   FERRITIN 13 05/11/2018   IRONPCTSAT 16 04/08/2020   IRONPCTSAT 11 08/18/2017   IRONPCTSAT 25 02/13/2017   Lab Results  Component Value Date   LDH 157 10/11/2022   LDH 157 05/11/2018     STUDIES:   No results found.

## 2022-10-11 ENCOUNTER — Ambulatory Visit (HOSPITAL_COMMUNITY)
Admission: RE | Admit: 2022-10-11 | Discharge: 2022-10-11 | Disposition: A | Discharge status: Home / Self Care | Payer: PPO | Source: Ambulatory Visit | Attending: Gastroenterology | Admitting: Gastroenterology

## 2022-10-11 ENCOUNTER — Inpatient Hospital Stay: Payer: PPO | Attending: Hematology | Admitting: Hematology

## 2022-10-11 ENCOUNTER — Encounter: Payer: Self-pay | Admitting: Hematology

## 2022-10-11 ENCOUNTER — Inpatient Hospital Stay: Payer: PPO

## 2022-10-11 VITALS — BP 100/82 | HR 92 | Temp 98.2°F | Resp 18 | Ht 66.0 in | Wt 179.0 lb

## 2022-10-11 DIAGNOSIS — Z1231 Encounter for screening mammogram for malignant neoplasm of breast: Secondary | ICD-10-CM

## 2022-10-11 DIAGNOSIS — D509 Iron deficiency anemia, unspecified: Secondary | ICD-10-CM | POA: Diagnosis present

## 2022-10-11 DIAGNOSIS — D508 Other iron deficiency anemias: Secondary | ICD-10-CM

## 2022-10-11 DIAGNOSIS — R198 Other specified symptoms and signs involving the digestive system and abdomen: Secondary | ICD-10-CM | POA: Insufficient documentation

## 2022-10-11 DIAGNOSIS — D649 Anemia, unspecified: Secondary | ICD-10-CM

## 2022-10-11 LAB — COMPREHENSIVE METABOLIC PANEL
ALT: 20 U/L (ref 0–44)
AST: 22 U/L (ref 15–41)
Albumin: 3.7 g/dL (ref 3.5–5.0)
Alkaline Phosphatase: 83 U/L (ref 38–126)
Anion gap: 7 (ref 5–15)
BUN: 40 mg/dL — ABNORMAL HIGH (ref 8–23)
CO2: 24 mmol/L (ref 22–32)
Calcium: 9.2 mg/dL (ref 8.9–10.3)
Chloride: 107 mmol/L (ref 98–111)
Creatinine, Ser: 1.27 mg/dL — ABNORMAL HIGH (ref 0.44–1.00)
GFR, Estimated: 45 mL/min — ABNORMAL LOW (ref >60)
Glucose, Bld: 177 mg/dL — ABNORMAL HIGH (ref 70–99)
Potassium: 4.2 mmol/L (ref 3.5–5.1)
Sodium: 138 mmol/L (ref 135–145)
Total Bilirubin: 0.7 mg/dL (ref 0.3–1.2)
Total Protein: 7.1 g/dL (ref 6.5–8.1)

## 2022-10-11 LAB — CBC WITH DIFFERENTIAL/PLATELET
Abs Immature Granulocytes: 0.03 10*3/uL (ref 0.00–0.07)
Basophils Absolute: 0.1 10*3/uL (ref 0.0–0.1)
Basophils Relative: 1 %
Eosinophils Absolute: 0.1 10*3/uL (ref 0.0–0.5)
Eosinophils Relative: 1 %
HCT: 32.8 % — ABNORMAL LOW (ref 36.0–46.0)
Hemoglobin: 10.1 g/dL — ABNORMAL LOW (ref 12.0–15.0)
Immature Granulocytes: 0 %
Lymphocytes Relative: 31 %
Lymphs Abs: 2.2 10*3/uL (ref 0.7–4.0)
MCH: 28.9 pg (ref 26.0–34.0)
MCHC: 30.8 g/dL (ref 30.0–36.0)
MCV: 94 fL (ref 80.0–100.0)
Monocytes Absolute: 0.6 10*3/uL (ref 0.1–1.0)
Monocytes Relative: 9 %
Neutro Abs: 4.1 10*3/uL (ref 1.7–7.7)
Neutrophils Relative %: 58 %
Platelets: 182 10*3/uL (ref 150–400)
RBC: 3.49 MIL/uL — ABNORMAL LOW (ref 3.87–5.11)
RDW: 16.6 % — ABNORMAL HIGH (ref 11.5–15.5)
WBC: 7.2 10*3/uL (ref 4.0–10.5)
nRBC: 0 % (ref 0.0–0.2)

## 2022-10-11 LAB — RETICULOCYTES
Immature Retic Fract: 18.6 % — ABNORMAL HIGH (ref 2.3–15.9)
RBC.: 3.51 MIL/uL — ABNORMAL LOW (ref 3.87–5.11)
Retic Count, Absolute: 49.5 10*3/uL (ref 19.0–186.0)
Retic Ct Pct: 1.4 % (ref 0.4–3.1)

## 2022-10-11 LAB — IRON AND TIBC
Iron: 44 ug/dL (ref 28–170)
Saturation Ratios: 11 % (ref 10.4–31.8)
TIBC: 393 ug/dL (ref 250–450)
UIBC: 349 ug/dL

## 2022-10-11 LAB — DIRECT ANTIGLOBULIN TEST (NOT AT ARMC)
DAT, IgG: NEGATIVE
DAT, complement: NEGATIVE

## 2022-10-11 LAB — LACTATE DEHYDROGENASE: LDH: 157 U/L (ref 98–192)

## 2022-10-11 LAB — FOLATE: Folate: 34.5 ng/mL (ref >5.9)

## 2022-10-11 LAB — VITAMIN B12: Vitamin B-12: 6842 pg/mL — ABNORMAL HIGH (ref 180–914)

## 2022-10-11 LAB — FERRITIN: Ferritin: 13 ng/mL (ref 11–307)

## 2022-10-11 NOTE — Patient Instructions (Signed)
You were seen and examined today by Dr. Ellin Saba. Dr. Ellin Saba is a hematologist, meaning that he specializes in blood abnormalities. Dr. Ellin Saba discussed your past medical history, family history of cancers/blood conditions and the events that led to you being here today.  You were referred to Dr. Ellin Saba due to iron deficiency anemia.  Dr. Ellin Saba has recommended additional labs today for further evaluation.  Follow-up as scheduled.

## 2022-10-12 LAB — KAPPA/LAMBDA LIGHT CHAINS
Kappa free light chain: 69.8 mg/L — ABNORMAL HIGH (ref 3.3–19.4)
Kappa, lambda light chain ratio: 1.5 (ref 0.26–1.65)
Lambda free light chains: 46.6 mg/L — ABNORMAL HIGH (ref 5.7–26.3)

## 2022-10-13 LAB — PROTEIN ELECTROPHORESIS, SERUM
A/G Ratio: 1.2 (ref 0.7–1.7)
Albumin ELP: 3.8 g/dL (ref 2.9–4.4)
Alpha-1-Globulin: 0.2 g/dL (ref 0.0–0.4)
Alpha-2-Globulin: 0.7 g/dL (ref 0.4–1.0)
Beta Globulin: 1 g/dL (ref 0.7–1.3)
Gamma Globulin: 1.3 g/dL (ref 0.4–1.8)
Globulin, Total: 3.2 g/dL (ref 2.2–3.9)
Total Protein ELP: 7 g/dL (ref 6.0–8.5)

## 2022-10-13 LAB — COPPER, SERUM: Copper: 119 ug/dL (ref 80–158)

## 2022-10-17 LAB — IMMUNOFIXATION ELECTROPHORESIS
IgA: 275 mg/dL (ref 64–422)
IgG (Immunoglobin G), Serum: 1410 mg/dL (ref 586–1602)
IgM (Immunoglobulin M), Srm: 174 mg/dL (ref 26–217)
Total Protein ELP: 6.7 g/dL (ref 6.0–8.5)

## 2022-10-17 LAB — METHYLMALONIC ACID, SERUM: Methylmalonic Acid, Quantitative: 171 nmol/L (ref 0–378)

## 2022-10-18 ENCOUNTER — Encounter (HOSPITAL_COMMUNITY): Payer: Self-pay | Admitting: Internal Medicine

## 2022-10-18 ENCOUNTER — Encounter: Payer: Self-pay | Admitting: *Deleted

## 2022-10-19 ENCOUNTER — Ambulatory Visit (HOSPITAL_COMMUNITY): Payer: HMO

## 2022-10-21 ENCOUNTER — Encounter (HOSPITAL_COMMUNITY): Payer: Self-pay | Admitting: Internal Medicine

## 2022-10-21 ENCOUNTER — Ambulatory Visit (HOSPITAL_COMMUNITY)
Admission: RE | Admit: 2022-10-21 | Discharge: 2022-10-21 | Disposition: A | Discharge status: Home / Self Care | Payer: PPO | Source: Ambulatory Visit | Attending: Acute Care | Admitting: Acute Care

## 2022-10-21 DIAGNOSIS — Z430 Encounter for attention to tracheostomy: Secondary | ICD-10-CM | POA: Diagnosis present

## 2022-10-21 NOTE — Progress Notes (Addendum)
Tracheostomy Procedure Note  [name] [id number] 21-Jun-1951  Pre Procedure Tracheostomy Information  Trach Brand: Shiley Size:  6.0 [id number] Style: Cuffed Secured by: Velcro   Procedure: Trach Change and Trach Cleaning and Observation Bronchoscopy Lidocaine neb given prior to bronchoscopy and trach change   Post Procedure Tracheostomy Information  Trach Brand: Shiley Size:  6.0 [id number] Style: Cuffed Secured by: Velcro   Post Procedure Evaluation:  ETCO2 positive color change from yellow to purple : Yes.   Vital signs:VSS Patients current condition: stable Complications: No apparent complications Trach site exam: clean, dry Wound care done: 4 x 4 gauze drain Patient did tolerate procedure well.   Education: none  Prescription needs: none    Additional needs: none

## 2022-10-25 IMAGING — DX DG CHEST 2V
3 series · 3 of 3 positions shown · non-contrast
Comparison: 11/16/2019

CLINICAL DATA: Fell, chest pain

EXAM:
CHEST - 2 VIEW

[chest pa]
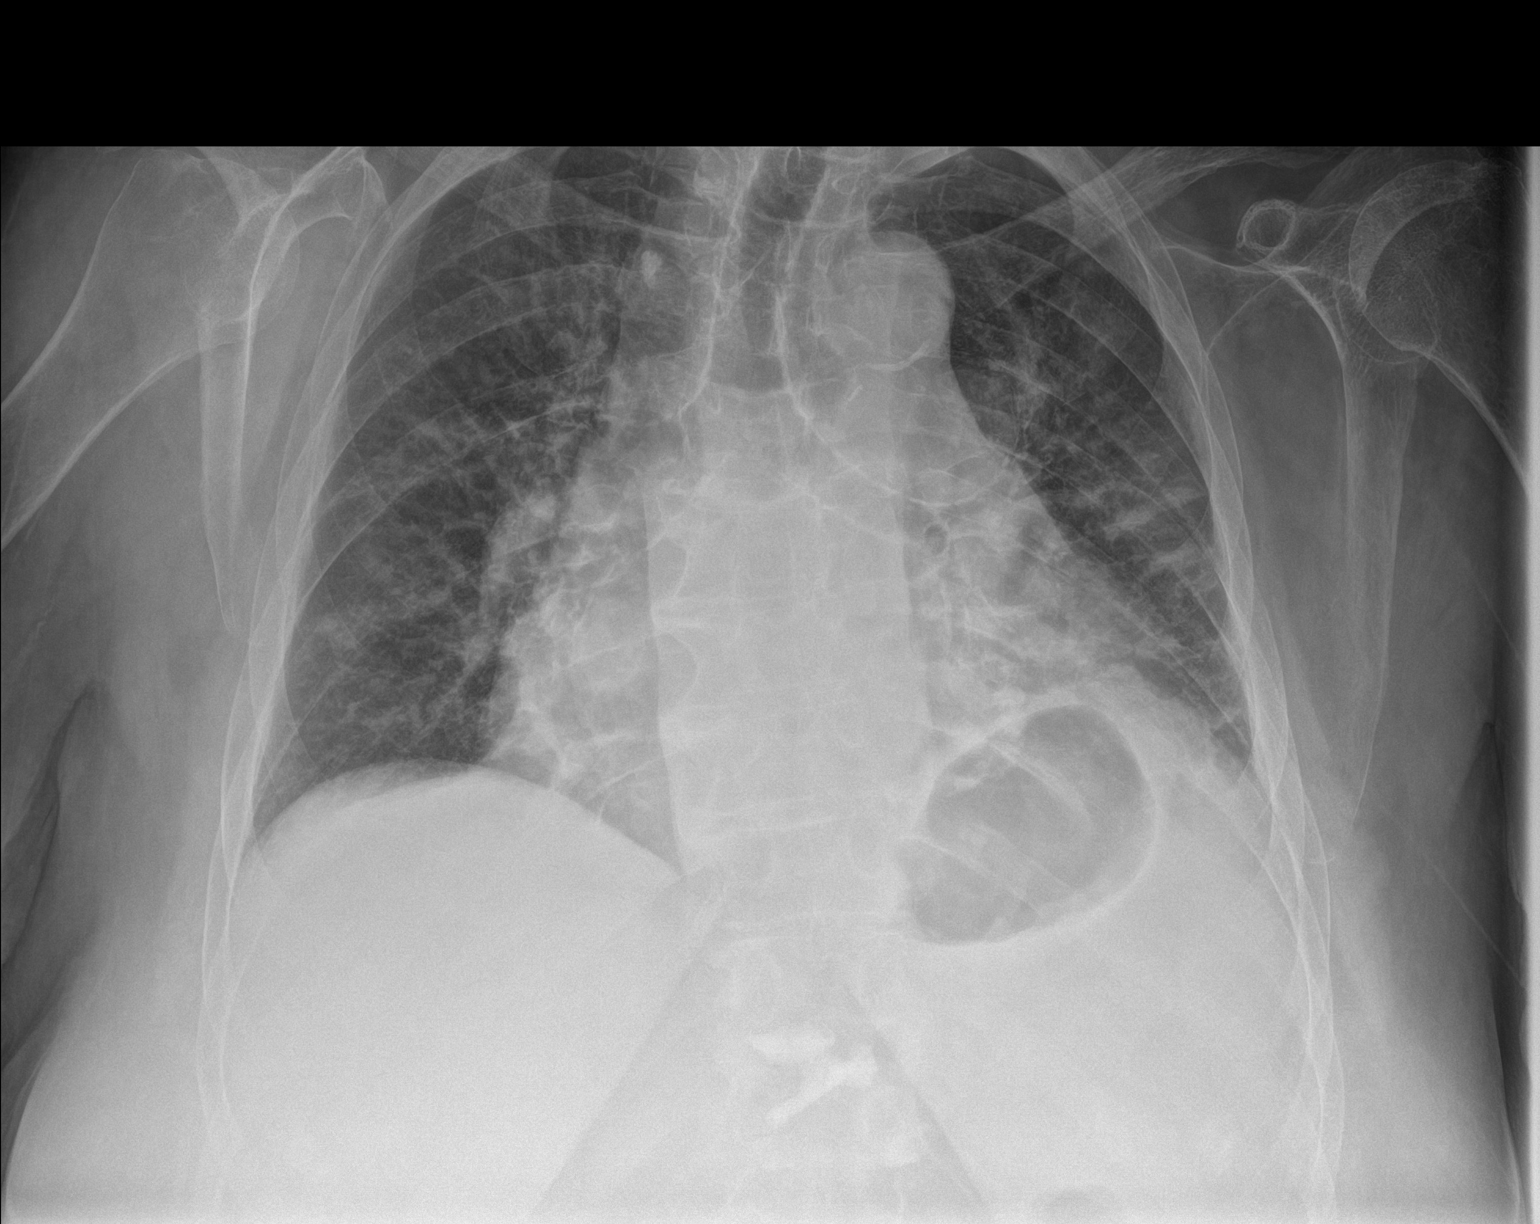

[chest lat]
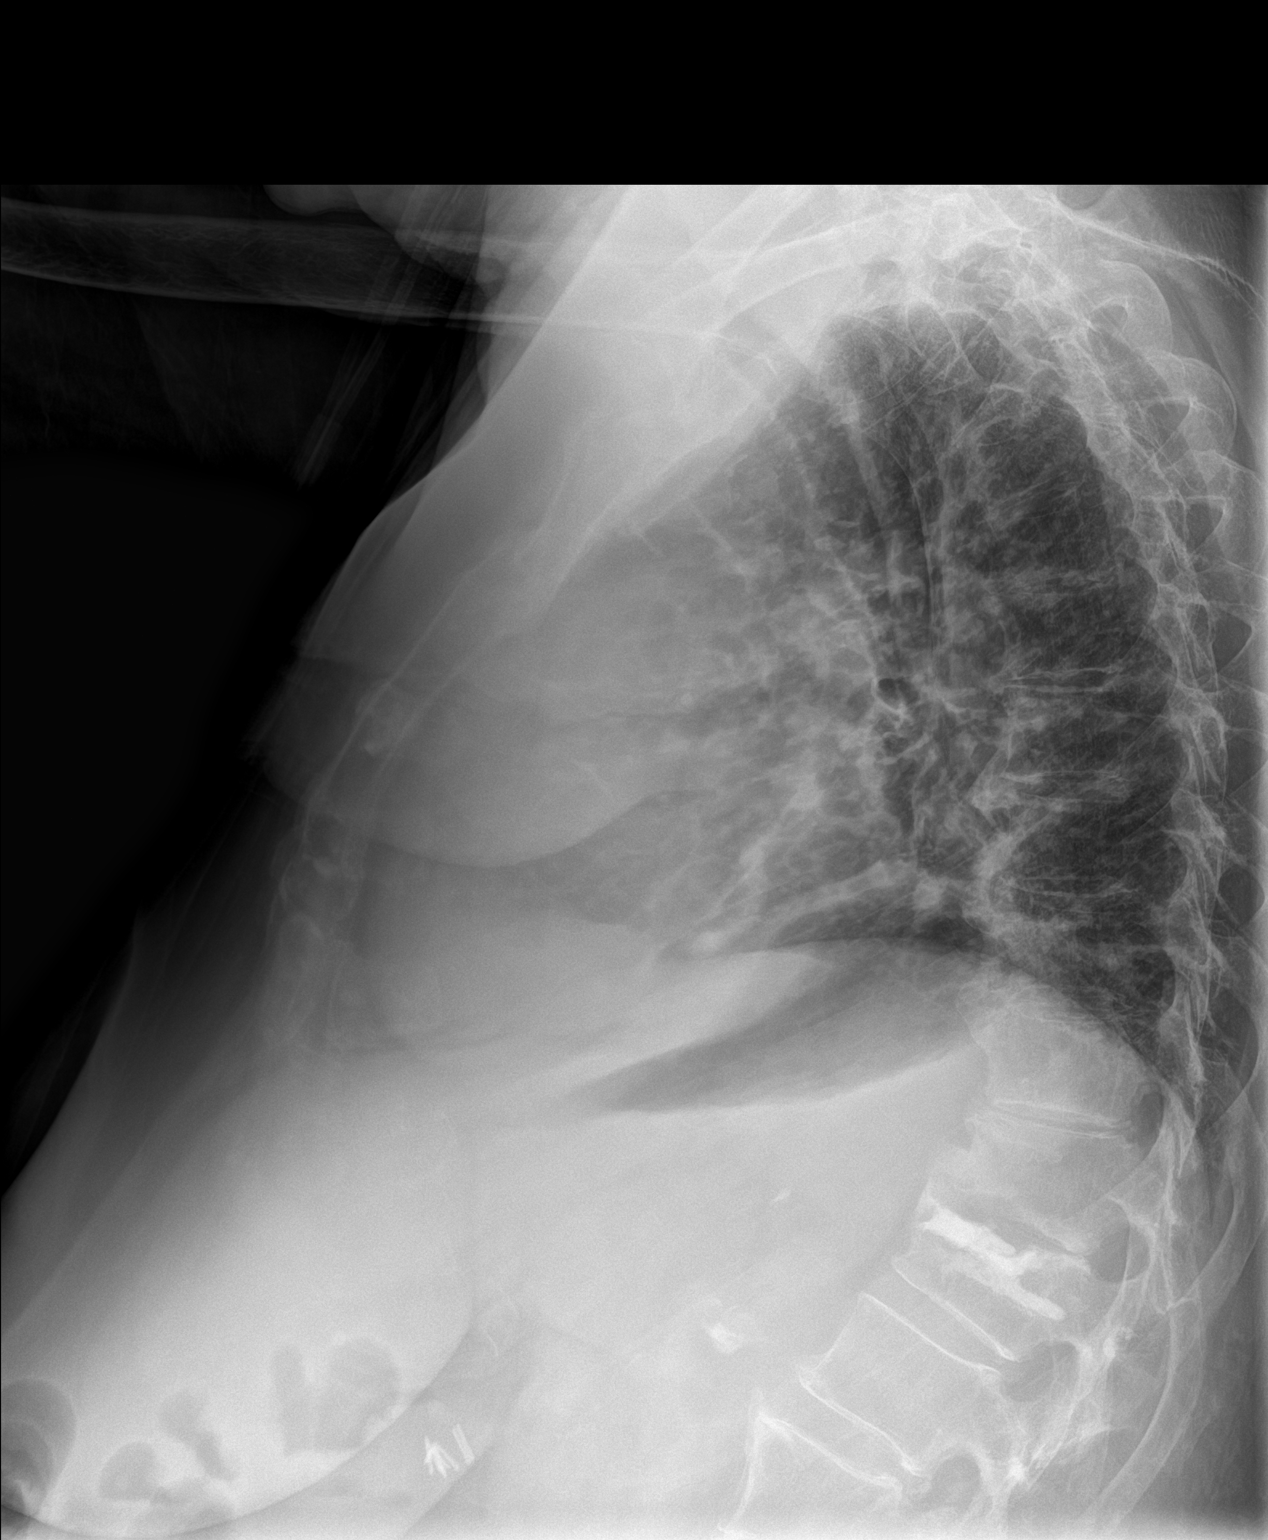

[chest ap]
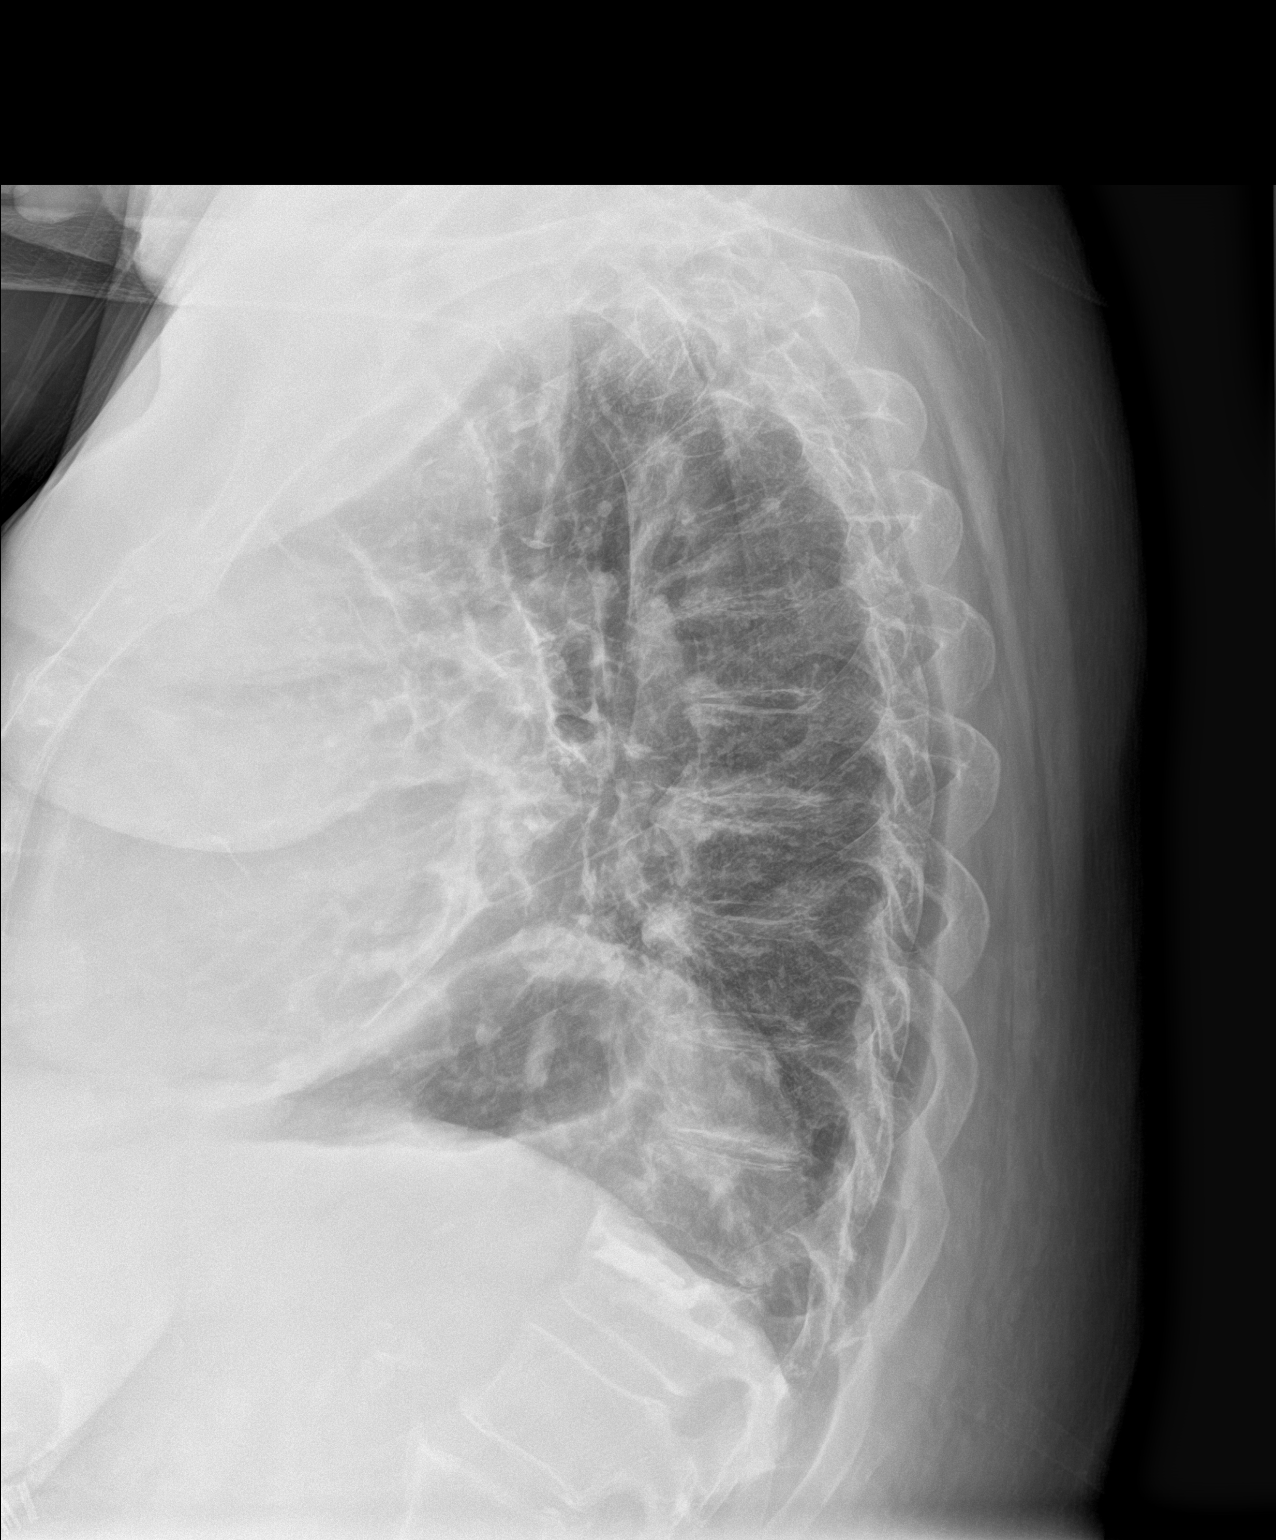

[3 of 3 positions shown; findings below may reference images not displayed]

FINDINGS: Frontal and lateral views of the chest demonstrate a stable cardiac
silhouette. Chronic areas of scarring are seen throughout the lungs,
greatest at the left lung base. No acute airspace disease, effusion,
or pneumothorax. Chronic elevation left hemidiaphragm. Stable
compression deformity at the thoracolumbar junction with previous
vertebral augmentation. No acute bony abnormality.
IMPRESSION: 1. Stable scarring.  No acute process.

## 2022-10-26 ENCOUNTER — Ambulatory Visit (HOSPITAL_COMMUNITY)
Admission: RE | Admit: 2022-10-26 | Discharge: 2022-10-26 | Disposition: A | Discharge status: Home / Self Care | Payer: PPO | Source: Ambulatory Visit | Attending: Hematology | Admitting: Hematology

## 2022-10-26 ENCOUNTER — Encounter (HOSPITAL_COMMUNITY): Payer: Self-pay

## 2022-10-26 DIAGNOSIS — Z1231 Encounter for screening mammogram for malignant neoplasm of breast: Secondary | ICD-10-CM | POA: Diagnosis present

## 2022-10-27 ENCOUNTER — Encounter: Payer: Self-pay | Admitting: Physician Assistant

## 2022-10-27 ENCOUNTER — Inpatient Hospital Stay (HOSPITAL_BASED_OUTPATIENT_CLINIC_OR_DEPARTMENT_OTHER): Payer: PPO | Admitting: Physician Assistant

## 2022-10-27 VITALS — BP 108/66 | HR 82 | Temp 98.5°F | Resp 20 | Wt 182.0 lb

## 2022-10-27 DIAGNOSIS — D509 Iron deficiency anemia, unspecified: Secondary | ICD-10-CM | POA: Diagnosis not present

## 2022-10-27 DIAGNOSIS — D508 Other iron deficiency anemias: Secondary | ICD-10-CM

## 2022-10-27 NOTE — Progress Notes (Signed)
[facility] [address] [place] [address]   Clinic Day:  10/27/2022  Referring physician: Gareth Morgan, MD  Patient Care Team: Gareth Morgan, MD as PCP - General (Family Medicine) Wyline Mood, Dorothe Pea, MD as PCP - Cardiology (Cardiology) West Bali, MD (Inactive) as Consulting Physician (Gastroenterology) Galen Manila, Novella Olive, MD (Inactive) as Consulting Physician (Hematology and Oncology) Lanelle Bal, DO as Consulting Physician (Internal Medicine)   CHIEF COMPLAINT/PURPOSE OF CONSULT:   Diagnosis: Iron deficiency anemia  HISTORY OF PRESENT ILLNESS:   Jaedah is a 72 y.o. female presenting to clinic today for evaluation of iron deficiency anemia. She was last seen by Dr. Ellin Saba on 10/11/2022. She presents today for a follow up visit to review lab results.   Today, she states that she is doing well overall. Her appetite level is at 100%. Her energy level is at 75%. She denies any nausea, vomiting or abdominal pain. Her bowel habits are unchanged without recurrent episodes of diarrhea or constipation. She denies easy bruising or signs of active bleeding. She does have lower extremity neuropathy that is chronic. She denies fevers, chills, sweats, shortness of breath, chest pain or cough. She has no other complaints.   PAST MEDICAL HISTORY:   Past Medical History: Past Medical History:  Diagnosis Date   Abnormal pulmonary function test    Anemia    H/H of 10/30 with a normal MCV in 12/09   Anxiety    Arthritis    Barrett's esophagus    Diagnosed 1995. Last EGD 2016-NO BARRETT'S.    Chest pain    a. 2022 Cath: nl cors; b. 2008 Neg myoview; c. 01/2014 Cath: Nl cors. EF 40%; c. 08/2020 NSTEMI/Cath [facility]): nl cors.   Chronic anticoagulation    Chronic combined systolic and diastolic CHF (congestive heart failure)    a. 10/2014 Echo: EF40%; b. 01/2015 Echo: EF 55-60%; c. 11/2020 Echo: EF 55-60%, mod LVH, sev BAE, mild MR, mod TR. PASP ; d. 01/2021  Echo: EF 45-50%, glob HK, mod conc LVH, mod reduced RV fxn, RVSP 60.43mmHg, Mod TR, mild to mod MR, sev dil LA.   Chronic LBP    Surgical intervention in 1996   Diabetes mellitus, type 2    Insulin therapy; exacerbated by prednisone   Gastroparesis    99% retention 05/2008 on GES   GERD (gastroesophageal reflux disease)    Heart attack 08/13/2020   Hiatal hernia    Hyperlipidemia    Hypertension    Hypothyroid    IBS (irritable bowel syndrome)    Obesity    OSA on CPAP    had CPAP and cannot tolerate.   Paroxysmal atrial fibrillation    a.CHA2DS2VASc = 6-->eliquis. Also on Amio.   Pulmonary hypertension 01/2015   a. 01/2015 RHC: Predominantly pulmonary venous hypertension but may be component of PAH (PA mean 52, PCWP 31); b. 01/2021 Echo: RVSP 60.1mmHg w/ mod reduced RV fxn.   Seizures    last seizure was 2 years ago; on keppra for this; unknown etiology   Syncope    a. Admitted 05/2009; magnetic resonance imagin/ MRA - negative; etiology thought to be orthostasis secondary to drugs and dehydration. b. Syncope 02/2015 also felt 2/2 dehydration.    Surgical History: Past Surgical History:  Procedure Laterality Date   BACK SURGERY  1996   BIOPSY N/A 11/08/2013   Procedure: BIOPSY  / Tissue sampling / ulcers present in small intestine;  Surgeon: West Bali, MD;  [facility];  Service: Endoscopy;  Laterality: N/A;   CARDIAC CATHETERIZATION  2002   CARDIAC CATHETERIZATION N/A 01/26/2015   Procedure: Right Heart Cath;  Surgeon: Laurey Morale, MD;  Location: [facility] INVASIVE CV LAB;  Service: Cardiovascular;  Laterality: N/A;   CARDIOVASCULAR STRESS TEST  2008   Stress nuclear study   CARDIOVERSION N/A 03/06/2015   Procedure: CARDIOVERSION;  Surgeon: Laurey Morale, MD;  Location: [facility] ENDOSCOPY;  Service: Cardiovascular;  Laterality: N/A;   CARPAL TUNNEL RELEASE  1994   CATARACT EXTRACTION W/PHACO Right 01/10/2022   Procedure: CATARACT EXTRACTION PHACO AND INTRAOCULAR LENS  PLACEMENT (IOC);  Surgeon: [name], MD;  Location: [facility] ORS;  Service: Ophthalmology;  Laterality: Right;  CDE: 21.51   CATARACT EXTRACTION W/PHACO Left 01/24/2022   Procedure: CATARACT EXTRACTION PHACO AND INTRAOCULAR LENS PLACEMENT (IOC);  Surgeon: [name], MD;  Location: [facility] ORS;  Service: Ophthalmology;  Laterality: Left;  CDE: 17.13   COLONOSCOPY  11/2011   Dr. Darrick Penna: Internal hemorrhoids, mild diverticulosis. Random colon biopsies negative.   COLONOSCOPY N/A 11/08/2013   SLF: Normal mucosa in the terminal ileum/The colon IS redundant/  Moderate diverticulosis throughout the entire colon. ileum bx benign. colon bx benign   CRYOTHERAPY  01/23/2021   Procedure: CRYOTHERAPY;  Surgeon: Lorin Glass, MD;  Location: [facility] ENDOSCOPY;  Service: Pulmonary;;   CRYOTHERAPY  01/24/2021   Procedure: Cloyde Reams;  Surgeon: Lorin Glass, MD;  Location: [facility] ENDOSCOPY;  Service: Pulmonary;;   CRYOTHERAPY  01/26/2021   Procedure: Cloyde Reams;  Surgeon: Lupita Leash, MD;  Location: [facility] ENDOSCOPY;  Service: Cardiopulmonary;;   ESOPHAGOGASTRODUODENOSCOPY  2008   Barrett's without dysplasia. esphagus dilated. antral erosions, h.pylori serologies negative.   ESOPHAGOGASTRODUODENOSCOPY  11/2011   Dr. Darrick Penna: Barrett's esophagus, mild gastritis, diverticulum in the second portion of the duodenum repeat EGD 3 years. Small bowel biopsies negative. Gastric biopsy show reactive gastropathy but no H. pylori. Esophageal biopsies consistent with GERD. Next EGD 11/2014   ESOPHAGOGASTRODUODENOSCOPY N/A 11/21/2014   ZOX:WRUE non-erosive gastritis/irregular z-line   ESOPHAGOGASTRODUODENOSCOPY  03/2022   FOREIGN BODY REMOVAL  01/23/2021   Procedure: FOREIGN BODY REMOVAL;  Surgeon: Lorin Glass, MD;  Location: [facility] ENDOSCOPY;  Service: Pulmonary;;   FOREIGN BODY REMOVAL  01/24/2021   Procedure: FOREIGN BODY REMOVAL;  Surgeon: Lorin Glass, MD;  Location: [facility] ENDOSCOPY;  Service: Pulmonary;;   GIVENS  CAPSULE STUDY  12/07/2011   Proximal small bowel, rare AVM. Distal small bowel, multiple ulcers noted   GIVENS CAPSULE STUDY N/A 09/27/2013   Distal small bowel ulcers extending to TI.   GIVENS CAPSULE STUDY N/A 10/10/2013   Procedure: GIVENS CAPSULE STUDY;  Surgeon: West Bali, MD;  [facility];  Service: Endoscopy;  Laterality: N/A;  7:30   HEMOSTASIS CONTROL  01/24/2021   Procedure: HEMOSTASIS CONTROL;  Surgeon: Lorin Glass, MD;  Location: [facility] ENDOSCOPY;  Service: Pulmonary;;   HEMOSTASIS CONTROL  01/26/2021   Procedure: HEMOSTASIS CONTROL;  Surgeon: Lupita Leash, MD;  Location: [facility] ENDOSCOPY;  Service: Cardiopulmonary;;   IR GASTROSTOMY TUBE MOD SED  01/11/2021   IR GASTROSTOMY TUBE REMOVAL  04/13/2021   IR KYPHO THORACIC WITH BONE BIOPSY  02/09/2018   KNEE ARTHROSCOPY WITH MEDIAL MENISECTOMY Right 06/09/2016   Procedure: KNEE ARTHROSCOPY WITH MEDIAL MENISECTOMY;  Surgeon: Vickki Hearing, MD;  Location: [facility]S;  Service: Orthopedics;  Laterality: Right;  medial and lateral menisectomy   LAMINECTOMY  1995   L4-L5   LAPAROSCOPIC CHOLECYSTECTOMY  1990s   LEFT HEART CATHETERIZATION WITH CORONARY ANGIOGRAM  01/10/2014   Procedure: LEFT HEART CATHETERIZATION WITH CORONARY ANGIOGRAM;  Surgeon: Lesleigh Noe, MD;  Location: [facility] CATH LAB;  Service: Cardiovascular;;   MICROLARYNGOSCOPY WITH LASER AND BALLOON DILATION N/A 11/17/2021   Procedure: MICROLARYNGOSCOPY WITH CO2 LASER, SUBGLOTTIC LARYNGEAL MASS BIOSPY;  Surgeon: Serena Colonel, MD;  Location: [facility] OR;  Service: ENT;  Laterality: N/A;   RIGHT HEART CATHETERIZATION N/A 01/10/2014   Procedure: RIGHT HEART CATH;  Surgeon: Lesleigh Noe, MD;  Location: [facility] CATH LAB;  Service: Cardiovascular;  Laterality: N/A;   TOTAL ABDOMINAL HYSTERECTOMY  1999   TRACHEOSTOMY REVISION N/A 07/18/2021   Procedure: TRACHEOSTOMY REVISION;  Surgeon: Suzanna Obey, MD;  Location: [facility] OR;  Service: ENT;  Laterality: N/A;   TRACHEOSTOMY  TUBE PLACEMENT N/A 09/01/2017   Procedure: TRACHEOSTOMY;  Surgeon: Drema Halon, MD;  Location: [facility] OR;  Service: ENT;  Laterality: N/A;   VIDEO BRONCHOSCOPY Bilateral 01/23/2021   Procedure: VIDEO BRONCHOSCOPY WITHOUT FLUORO;  Surgeon: Lorin Glass, MD;  Location: [facility] ENDOSCOPY;  Service: Pulmonary;  Laterality: Bilateral;   VIDEO BRONCHOSCOPY Bilateral 01/24/2021   Procedure: VIDEO BRONCHOSCOPY WITHOUT FLUORO;  Surgeon: Lorin Glass, MD;  Location: [facility] ENDOSCOPY;  Service: Pulmonary;  Laterality: Bilateral;   VIDEO BRONCHOSCOPY N/A 01/26/2021   Procedure: VIDEO BRONCHOSCOPY WITHOUT FLUORO;  Surgeon: Lupita Leash, MD;  Location: [facility] ENDOSCOPY;  Service: Cardiopulmonary;  Laterality: N/A;    Social History: Social History   Socioeconomic History   Marital status: Married    Spouse name: Not on file   Number of children: Not on file   Years of education: Not on file   Highest education level: Not on file  Occupational History   Occupation: Passenger transport manager at [employer]    Employer: [employer]    Comment: disabled    Employer: RETIRED  Tobacco Use   Smoking status: Former    Packs/day: 0.25    Years: 15.00    Additional pack years: 0.00    Total pack years: 3.75    Types: Cigarettes    Start date: 02/26/1966    Quit date: 06/30/1993    Years since quitting: 29.3   Smokeless tobacco: Never   Tobacco comments:    quit in 1984  Vaping Use   Vaping Use: Never used  Substance and Sexual Activity   Alcohol use: Not Currently    Comment: Occasional alcohol 30 years ago (09/02/21)   Drug use: No   Sexual activity: Yes    Birth control/protection: None, Surgical  Other Topics Concern   Not on file  Social History Narrative   Sedentary   4 children, "blended family"   Social Determinants of Health   Financial Resource Strain: Not on file  Food Insecurity: No Food Insecurity (10/11/2022)   Hunger Vital Sign    Worried About Running Out of Food in the Last Year: Never true     Ran Out of Food in the Last Year: Never true  Transportation Needs: No Transportation Needs (10/11/2022)   PRAPARE - Administrator, Civil Service (Medical): No    Lack of Transportation (Non-Medical): No  Physical Activity: Not on file  Stress: Not on file  Social Connections: Not on file  Intimate Partner Violence: Not At Risk (10/11/2022)   Humiliation, Afraid, Rape, and Kick questionnaire    Fear of Current or Ex-Partner: No    Emotionally Abused: No    Physically Abused: No  Sexually Abused: No    Family History: Family History  Problem Relation Age of Onset   Hypertension Mother    Alzheimer's disease Mother    Stroke Mother    Heart attack Mother    Breast cancer Sister    Hypertension Other    Heart disease Neg Hx    Colon cancer Neg Hx    Liver disease Neg Hx     Current Medications:  Current Outpatient Medications:    apixaban (ELIQUIS) 5 MG TABS tablet, Take 1 tablet (5 mg total) by mouth 2 (two) times daily., Disp: , Rfl:    atorvastatin (LIPITOR) 80 MG tablet, Take 1 tablet (80 mg total) by mouth daily., Disp: , Rfl:    Cholecalciferol (VITAMIN D3) 50 MCG (2000 UT) capsule, Take 2,000 Units by mouth daily., Disp: , Rfl:    dicyclomine (BENTYL) 10 MG capsule, TAKE 1 CAPSULE BY MOUTH UP TO FOUR TIMES DAILY BEFORE MEALS AND AT BEDTIME (STOP IF CONSTIPATION), Disp: 120 capsule, Rfl: 1   donepezil (ARICEPT) 5 MG tablet, Take 1 tablet (5 mg total) by mouth at bedtime., Disp: , Rfl:    DULoxetine (CYMBALTA) 30 MG capsule, Take 1 capsule (30 mg total) by mouth daily. (Patient taking differently: Take 30 mg by mouth 2 (two) times daily.), Disp: , Rfl: 3   furosemide (LASIX) 40 MG tablet, Take 1 tablet (40 mg total) by mouth daily. May take an additional 20 mg ( 0.5 tablet) as needed for swelling., Disp: 90 tablet, Rfl: 3   gabapentin (NEURONTIN) 100 MG capsule, Take 1 capsule (100 mg total) by mouth 3 (three) times daily., Disp: 90 capsule, Rfl: 3   insulin  glargine (LANTUS) 100 UNIT/ML injection, Inject 0.15 mLs (15 Units total) into the skin daily. (Patient taking differently: Inject 20-24 Units into the skin at bedtime.), Disp: 10 mL, Rfl: 11   ipratropium-albuterol (DUONEB) 0.5-2.5 (3) MG/3ML SOLN, Take 3 mLs by nebulization every 4 (four) hours as needed (Asthma)., Disp: , Rfl:    levETIRAcetam (KEPPRA) 500 MG tablet, Take 500 mg by mouth 2 (two) times daily., Disp: , Rfl:    levothyroxine (SYNTHROID) 175 MCG tablet, Take 1 tablet (175 mcg total) by mouth daily at 6 (six) AM., Disp: , Rfl:    loperamide (IMODIUM) 2 MG capsule, Take by mouth., Disp: , Rfl:    LORazepam (ATIVAN) 1 MG tablet, Take 1 mg by mouth at bedtime., Disp: , Rfl:    melatonin 3 MG TABS tablet, Take 1 tablet (3 mg total) by mouth at bedtime as needed., Disp: , Rfl: 0   metoprolol tartrate (LOPRESSOR) 25 MG tablet, Take 1 tablet (25 mg total) by mouth 2 (two) times daily., Disp: , Rfl:    Multiple Vitamins-Minerals (MULTIVITAMIN WITH MINERALS) tablet, Take 1 tablet by mouth daily. For Diabetics, Disp: , Rfl:    nitroGLYCERIN (NITROSTAT) 0.4 MG SL tablet, , Disp: , Rfl:    NOVOLIN R FLEXPEN RELION 100 UNIT/ML FlexPen, Inject 2-5 Units into the skin 3 (three) times daily before meals. Per sliding scale, Disp: , Rfl:    pantoprazole (PROTONIX) 40 MG tablet, Take 1 tablet (40 mg total) by mouth 2 (two) times daily before a meal., Disp: 60 tablet, Rfl: 3   potassium chloride (KLOR-CON) 10 MEQ tablet, Take 20 mEq by mouth daily., Disp: , Rfl:    sertraline (ZOLOFT) 25 MG tablet, Take 1 tablet (25 mg total) by mouth daily., Disp: , Rfl:    traMADol (ULTRAM) 50 MG tablet,  Take by mouth., Disp: , Rfl:    trolamine salicylate (ASPERCREME) 10 % cream, Apply 1 application. topically as needed (knee pain)., Disp: , Rfl:    vitamin B-12 (CYANOCOBALAMIN) 1000 MCG tablet, Take 1,000 mcg by mouth daily., Disp: , Rfl:    Allergies: Allergies  Allergen Reactions   Celexa [Citalopram  Hydrobromide] Other (See Comments)    Dyskinesia    Codeine Nausea And Vomiting and Other (See Comments)    HALLUCINATIONS     Dilaudid [Hydromorphone Hcl] Other (See Comments)    Made her pass out    Reglan [Metoclopramide] Other (See Comments)    DYSKINESIA    Hyoscyamine Other (See Comments)    MADE DIARRHEA WORSE    REVIEW OF SYSTEMS:   Review of Systems  Constitutional:  Positive for fatigue. Negative for chills and fever.  HENT:   Negative for lump/mass, mouth sores, nosebleeds, sore throat and trouble swallowing.   Eyes:  Negative for eye problems.  Respiratory:  Negative for cough and shortness of breath.   Cardiovascular:  Positive for leg swelling. Negative for chest pain and palpitations.  Gastrointestinal:  Negative for abdominal pain, constipation, diarrhea, nausea and vomiting.  Genitourinary:  Negative for bladder incontinence, difficulty urinating, dysuria, frequency, hematuria and nocturia.   Musculoskeletal:  Negative for arthralgias, back pain, flank pain, myalgias and neck pain.  Skin:  Negative for itching and rash.  Neurological:  Positive for headaches and numbness.  Hematological:  Does not bruise/bleed easily.  Psychiatric/Behavioral:  Negative for depression, sleep disturbance and suicidal ideas. The patient is not nervous/anxious.   All other systems reviewed and are negative.    VITALS:   Blood pressure 108/66, pulse 82, temperature 98.5 F (36.9 C), temperature source Oral, resp. rate 20, weight 182 lb (82.6 kg), SpO2 100 %.  Wt Readings from Last 3 Encounters:  10/27/22 182 lb (82.6 kg)  10/11/22 179 lb (81.2 kg)  10/05/22 179 lb 9.6 oz (81.5 kg)    Body mass index is 29.38 kg/m.   PHYSICAL EXAM:   Physical Exam Vitals reviewed.  Constitutional:      Appearance: Normal appearance.  Cardiovascular:     Rate and Rhythm: Normal rate and regular rhythm.     Heart sounds: Normal heart sounds.  Pulmonary:     Effort: Pulmonary effort  is normal.     Breath sounds: Normal breath sounds.  Abdominal:     Palpations: There is no hepatomegaly or splenomegaly.  Musculoskeletal:     Right lower leg: No edema.     Left lower leg: No edema.  Lymphadenopathy:     Cervical: No cervical adenopathy.     Right cervical: No superficial, deep or posterior cervical adenopathy.    Left cervical: No superficial, deep or posterior cervical adenopathy.     Upper Body:     Right upper body: No supraclavicular or axillary adenopathy.     Left upper body: No supraclavicular or axillary adenopathy.  Neurological:     General: No focal deficit present.     Mental Status: She is alert and oriented to person, place, and time.  Psychiatric:        Mood and Affect: Mood normal.        Behavior: Behavior normal.     LABS:      Latest Ref Rng & Units 10/11/2022    2:30 PM 11/17/2021    7:23 AM 07/19/2021    4:17 AM  CBC  WBC 4.0 - 10.5 K/uL  7.2  10.6  8.6   Hemoglobin 12.0 - 15.0 g/dL 78.2  9.7  9.0   Hematocrit 36.0 - 46.0 % 32.8  30.8  29.3   Platelets 150 - 400 K/uL 182  198  148       Latest Ref Rng & Units 10/11/2022    2:30 PM 11/17/2021    7:23 AM 07/19/2021    4:17 AM  CMP  Glucose 70 - 99 mg/dL 956  213  88   BUN 8 - 23 mg/dL 40  18  23   Creatinine 0.44 - 1.00 mg/dL 0.86  5.78  4.69   Sodium 135 - 145 mmol/L 138  140  142   Potassium 3.5 - 5.1 mmol/L 4.2  3.8  3.9   Chloride 98 - 111 mmol/L 107  111  113   CO2 22 - 32 mmol/L 24  23  21    Calcium 8.9 - 10.3 mg/dL 9.2  9.3  9.1   Total Protein 6.5 - 8.1 g/dL 7.1     Total Bilirubin 0.3 - 1.2 mg/dL 0.7     Alkaline Phos 38 - 126 U/L 83     AST 15 - 41 U/L 22     ALT 0 - 44 U/L 20        No results found for: "CEA1", "CEA" / No results found for: "CEA1", "CEA" No results found for: "PSA1" No results found for: "CAN199" No results found for: "CAN125"  Lab Results  Component Value Date   TOTALPROTELP 7.0 10/11/2022   TOTALPROTELP 6.7 10/11/2022   ALBUMINELP 3.8  10/11/2022   A1GS 0.2 10/11/2022   A2GS 0.7 10/11/2022   BETS 1.0 10/11/2022   GAMS 1.3 10/11/2022   MSPIKE Not Observed 10/11/2022   SPEI Comment 10/11/2022   Lab Results  Component Value Date   TIBC 393 10/11/2022   TIBC 260 04/08/2020   TIBC 221 (L) 08/18/2017   FERRITIN 13 10/11/2022   FERRITIN 58 04/08/2020   FERRITIN 131 05/08/2019   IRONPCTSAT 11 10/11/2022   IRONPCTSAT 16 04/08/2020   IRONPCTSAT 11 08/18/2017   Lab Results  Component Value Date   LDH 157 10/11/2022   LDH 157 05/11/2018     STUDIES:   DG Abd 2 Views  Result Date: 10/11/2022 CLINICAL DATA:  Constipation and diarrhea EXAM: ABDOMEN - 2 VIEW COMPARISON:  February 04, 2021 FINDINGS: Lung bases are stable. No free air, portal venous gas, or pneumatosis. Most of the bowel is in the left side of the abdomen, a stable finding. A few mildly prominent loops of small bowel in the left abdomen are borderline measuring between 3.0 and 3.3 cm. Gas is identified in the rectum. Mild fecal loading in the descending colon. No other bony or soft tissue abnormalities are identified. IMPRESSION: 1. Mild fecal loading in the descending colon. 2. Most of the bowel is in the left side of the abdomen, a stable finding. 3. A few mildly prominent loops of small bowel in the left abdomen are borderline measuring between 3.0 and 3.3 cm. Recommend clinical correlation. Gas is identified in the rectum. Electronically Signed   By: Gerome Sam III M.D.   On: 10/11/2022 17:55      ASSESSMENT & PLAN:   Assessment:  1.  Normocytic anemia: - She had a history of iron deficiency anemia in the past, status post Injectafer, last given in November 2019. - She had 2 units of blood transfusion during recent hospitalization. - She had  tracheotomy done as she cannot tolerate CPAP machine for sleep apnea. - She had 1 episode of rectal bleeding recently secondary to constipation.  Otherwise no bleeding per rectum or melena.  No ice pica.  2.   Social/family history: - She is seen with her daughter [name] who is her caregiver.  Patient walks with the help of walker.  She is able to dress herself.  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  Quit smoking in 1994. - Sister had breast cancer.  Another sister had leukemia.  Maternal uncle had stomach cancer.  Plan:  1.  Normocytic anemia: - Labs from 10/11/2022 reviewed which shows iron deficiency with ferritin 13, saturation 11%. There is mild renal dysfunction which can contribute to the anemia. No evidence of vitamin B12, folate, copper deficiencies. No evidence of paraproteinemia or  hemolysis.  -- Recommend IV monoferric x 1 dose to bolster iron and hgb levels.  --Per last GI note from 10/05/2022, last GI evaluation in 2015/2016 with nonerosive gastritis, pancolonic diverticulosis, idiopathic small bowel ulcers present since 2013.  As patient requested, we will hold off on endoscopic evaluation unless she has recurrent overt GI bleeding.  --Patient is currently taking vitamin B12 1000 mcg PO daily. Since vitamin B12 level is 6,842, recommend to discontinue B12 supplementation. --RTC in 8 weeks with repeat labs.   2.  Health maintenance: - Physical exam from 10/11/2022 was positive for small left axillary lymph node. I was not able to palpate the node in question.  -- Mammogram was done yesterday, awaiting final results.   Patient expressed understanding of the plan provided.   I have spent a total of 30 minutes minutes of face-to-face and non-face-to-face time, preparing to see the patient, performing a medically appropriate examination, counseling and educating the patient, ordering medications/tests/procedures, documenting clinical information in the electronic health record,  and care coordination.   Georga Kaufmann PA-C Dept of Hematology and Oncology [facility]

## 2022-10-28 ENCOUNTER — Inpatient Hospital Stay (HOSPITAL_COMMUNITY): Admission: RE | Admit: 2022-10-28 | Payer: HMO | Source: Ambulatory Visit

## 2022-10-28 ENCOUNTER — Encounter (HOSPITAL_COMMUNITY): Payer: Self-pay | Admitting: Hematology

## 2022-10-28 DIAGNOSIS — R928 Other abnormal and inconclusive findings on diagnostic imaging of breast: Secondary | ICD-10-CM

## 2022-10-31 ENCOUNTER — Other Ambulatory Visit (HOSPITAL_COMMUNITY): Payer: Self-pay | Admitting: Physician Assistant

## 2022-10-31 ENCOUNTER — Other Ambulatory Visit: Payer: Self-pay | Admitting: Physician Assistant

## 2022-10-31 DIAGNOSIS — Z1231 Encounter for screening mammogram for malignant neoplasm of breast: Secondary | ICD-10-CM

## 2022-10-31 DIAGNOSIS — R928 Other abnormal and inconclusive findings on diagnostic imaging of breast: Secondary | ICD-10-CM

## 2022-10-31 NOTE — Progress Notes (Signed)
Message from [place], PA-C - [place], can you call patient and review her screening mammogram from 4/24 which was not read when I saw her on 4/25. Findings did show calcifications in right breast which warrants further evaluation. I ordered a diagnostic mammogram of right breast as recommended by radiologist.   Patient's daughter aware and agreeable with plan. Informed of appointment for mammogram.

## 2022-11-02 ENCOUNTER — Inpatient Hospital Stay: Payer: PPO | Attending: Hematology

## 2022-11-02 VITALS — BP 109/66 | HR 85 | Temp 96.1°F | Resp 18

## 2022-11-02 DIAGNOSIS — D500 Iron deficiency anemia secondary to blood loss (chronic): Secondary | ICD-10-CM

## 2022-11-02 DIAGNOSIS — D508 Other iron deficiency anemias: Secondary | ICD-10-CM

## 2022-11-02 DIAGNOSIS — D509 Iron deficiency anemia, unspecified: Secondary | ICD-10-CM | POA: Insufficient documentation

## 2022-11-02 MED ORDER — SODIUM CHLORIDE 0.9 % IV SOLN: Freq: Once | INTRAVENOUS | Status: AC

## 2022-11-02 MED ORDER — CETIRIZINE HCL 10 MG PO TABS: 10.0000 mg | ORAL_TABLET | Freq: Once | ORAL | Status: DC

## 2022-11-02 MED ORDER — SODIUM CHLORIDE 0.9 % IV SOLN
1000.0000 mg | Freq: Once | INTRAVENOUS | Status: AC
  Administered 2022-11-02: 1000 mg via INTRAVENOUS
  Filled 2022-11-02: qty 10

## 2022-11-02 MED ORDER — ACETAMINOPHEN 325 MG PO TABS: 650.0000 mg | ORAL_TABLET | Freq: Once | ORAL | Status: DC

## 2022-11-02 NOTE — Patient Instructions (Signed)
[facility]  Discharge Instructions: Thank you for choosing [facility] to provide your oncology and hematology care.  If you have a lab appointment with the Cancer Center - please note that after April 8th, 2024, all labs will be drawn in the cancer center.  You do not have to check in or register with the main entrance as you have in the past but will complete your check-in in the cancer center.  Wear comfortable clothing and clothing appropriate for easy access to any Portacath or PICC line.   We strive to give you quality time with your provider. You may need to reschedule your appointment if you arrive late (15 or more minutes).  Arriving late affects you and other patients whose appointments are after yours.  Also, if you miss three or more appointments without notifying the office, you may be dismissed from the clinic at the provider's discretion.      For prescription refill requests, have your pharmacy contact our office and allow 72 hours for refills to be completed.    Today you received the following chemotherapy and/or immunotherapy agents Monoferric      To help prevent nausea and vomiting after your treatment, we encourage you to take your nausea medication as directed.  BELOW ARE SYMPTOMS THAT SHOULD BE REPORTED IMMEDIATELY: *FEVER GREATER THAN 100.4 F (38 C) OR HIGHER *CHILLS OR SWEATING *NAUSEA AND VOMITING THAT IS NOT CONTROLLED WITH YOUR NAUSEA MEDICATION *UNUSUAL SHORTNESS OF BREATH *UNUSUAL BRUISING OR BLEEDING *URINARY PROBLEMS (pain or burning when urinating, or frequent urination) *BOWEL PROBLEMS (unusual diarrhea, constipation, pain near the anus) TENDERNESS IN MOUTH AND THROAT WITH OR WITHOUT PRESENCE OF ULCERS (sore throat, sores in mouth, or a toothache) UNUSUAL RASH, SWELLING OR PAIN  UNUSUAL VAGINAL DISCHARGE OR ITCHING   Items with * indicate a potential emergency and should be followed up as soon as possible or go to the  Emergency Department if any problems should occur.  Please show the CHEMOTHERAPY ALERT CARD or IMMUNOTHERAPY ALERT CARD at check-in to the Emergency Department and triage nurse.  Should you have questions after your visit or need to cancel or reschedule your appointment, please contact [facility] [phone]  and follow the prompts.  Office hours are 8:00 a.m. to 4:30 p.m. Monday - Friday. Please note that voicemails left after 4:00 p.m. may not be returned until the following business day.  We are closed weekends and major holidays. You have access to a nurse at all times for urgent questions. Please call the main number to the clinic [phone] and follow the prompts.  For any non-urgent questions, you may also contact your provider using MyChart. We now offer e-Visits for anyone 18 and older to request care online for non-urgent symptoms. For details visit [email or web address].   Also download the MyChart app! Go to the app store, search "MyChart", open the app, select [facility], and log in with your MyChart username and password.   

## 2022-11-02 NOTE — Progress Notes (Signed)
Patient presents today for Monoferric infusion per providers order.  Vital signs within parameters for treatment.  Patient has no new complaints at this time.  Patient took tylenol and antihistamine at home.  Per pharmacy patient does not need premedications at this time.  Peripheral IV started and blood return noted pre and post infusion.  Stable during infusion without adverse affects.  Vital signs stable.  No complaints at this time.  Discharge from clinic via wheelchair in stable condition.  Alert and oriented X 3.  Follow up with [facility] as scheduled.

## 2022-11-04 ENCOUNTER — Encounter: Payer: Self-pay | Admitting: Physician Assistant

## 2022-11-08 ENCOUNTER — Ambulatory Visit (HOSPITAL_COMMUNITY)
Admission: RE | Admit: 2022-11-08 | Discharge: 2022-11-08 | Disposition: A | Discharge status: Home / Self Care | Payer: PPO | Source: Ambulatory Visit | Attending: Physician Assistant | Admitting: Physician Assistant

## 2022-11-08 DIAGNOSIS — R928 Other abnormal and inconclusive findings on diagnostic imaging of breast: Secondary | ICD-10-CM | POA: Insufficient documentation

## 2022-11-08 DIAGNOSIS — Z1231 Encounter for screening mammogram for malignant neoplasm of breast: Secondary | ICD-10-CM | POA: Insufficient documentation

## 2022-11-09 IMAGING — US US MESENTERIC ARTERIAL DOPPLER
1 series · 14 of 16 positions shown · non-contrast
Comparison: None recent

CLINICAL DATA: Abdominal pain

EXAM:
US MESENTERIC ARTERIAL DOPPLER

[Series 1: us mesenteric arteries · 14 of 26 slices shown]
[im 1/26]
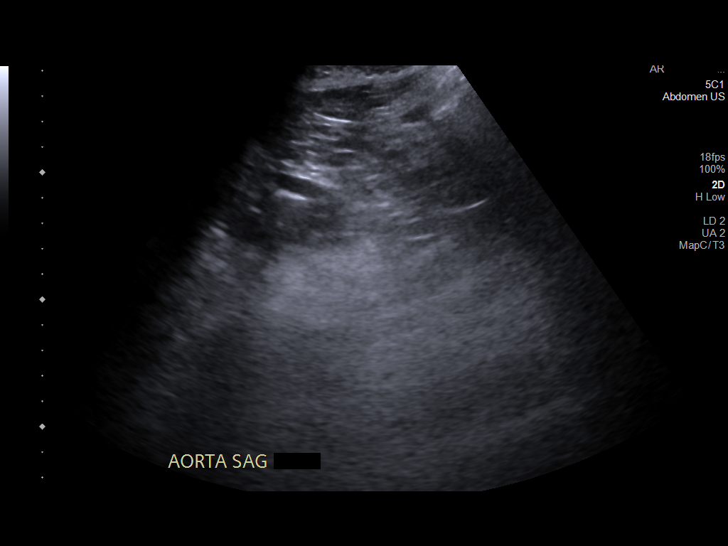
[im 2/26]
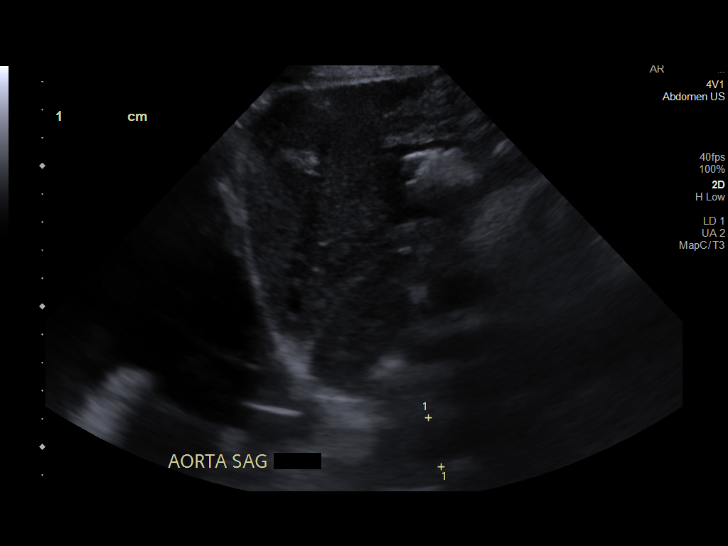
[im 4/26]
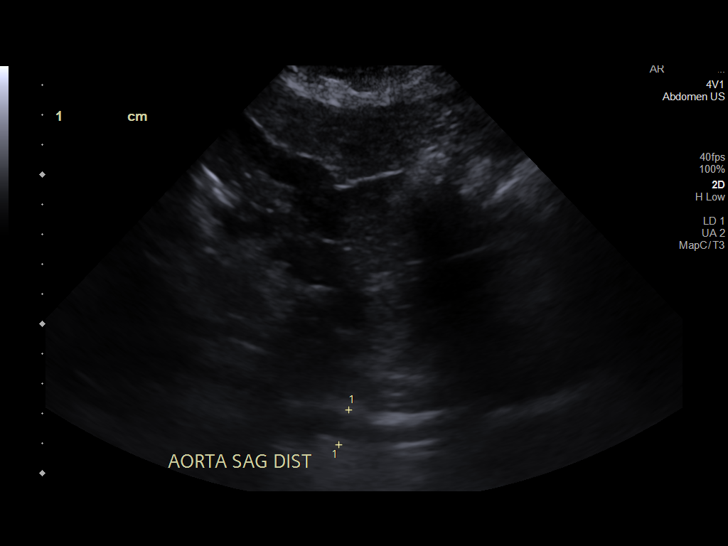
[im 7/26]
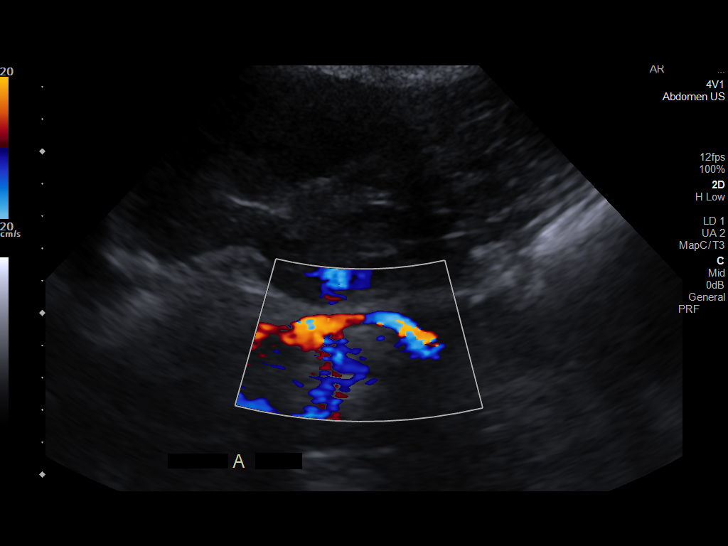
[im 9/26]
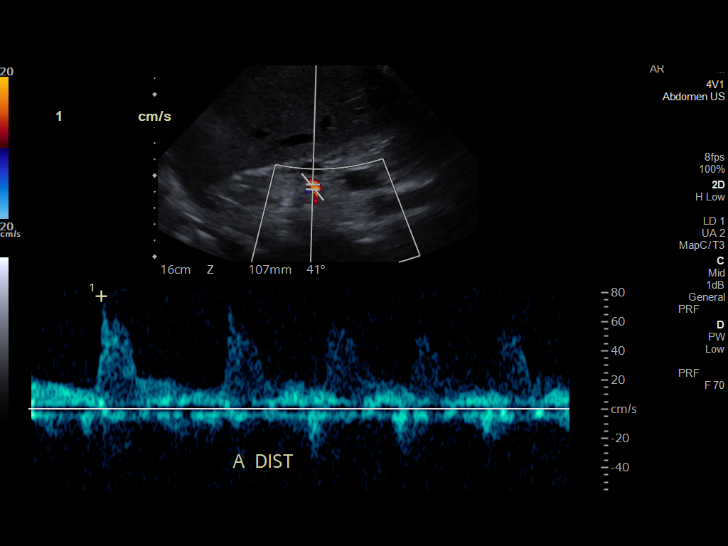
[im 11/26]
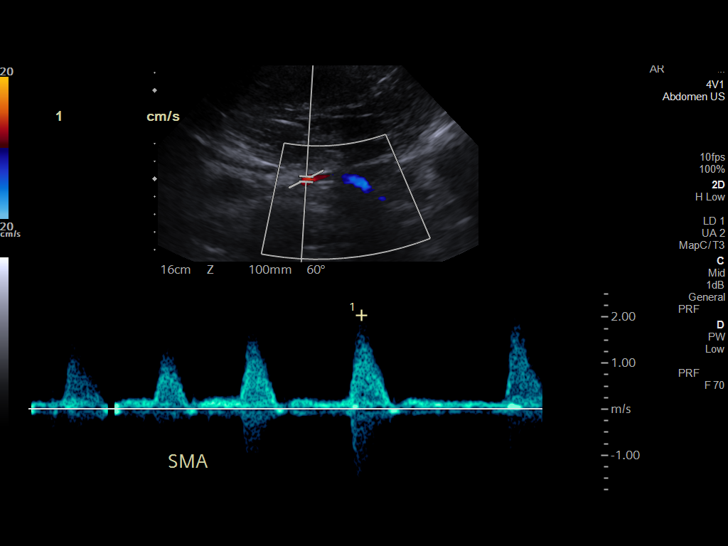
[im 12/26]
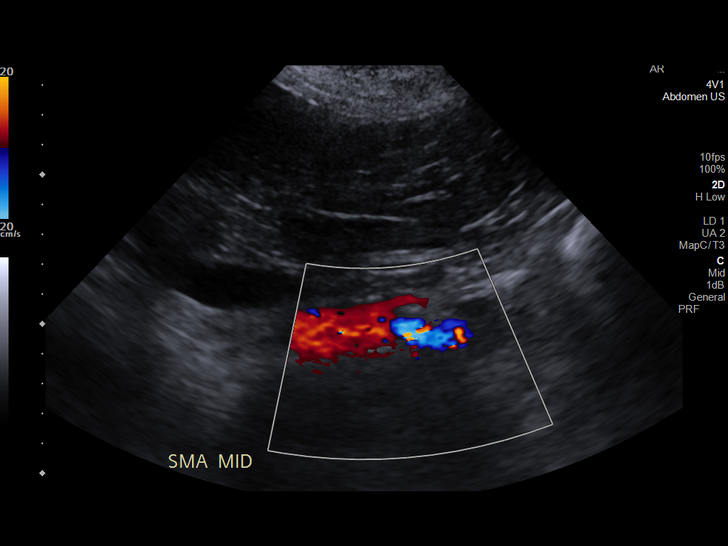
[im 14/26]
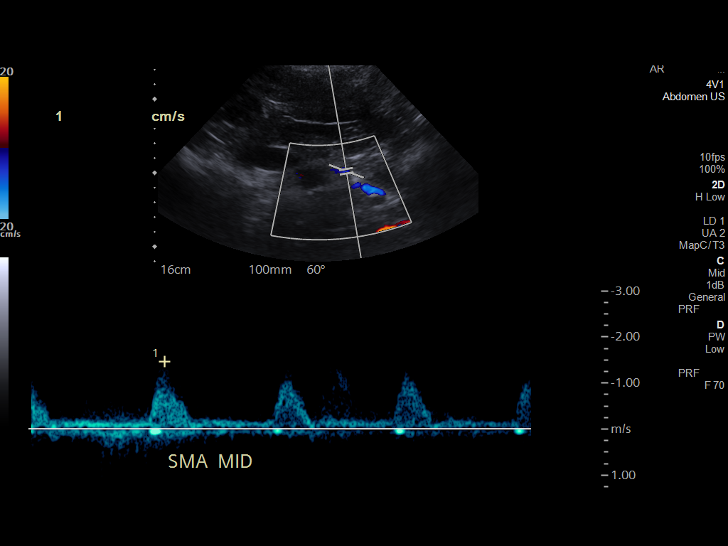
[im 16/26]
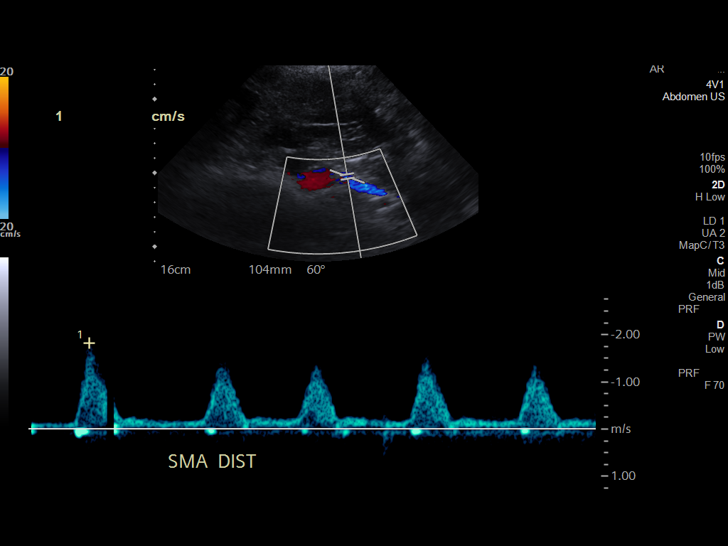
[im 17/26]
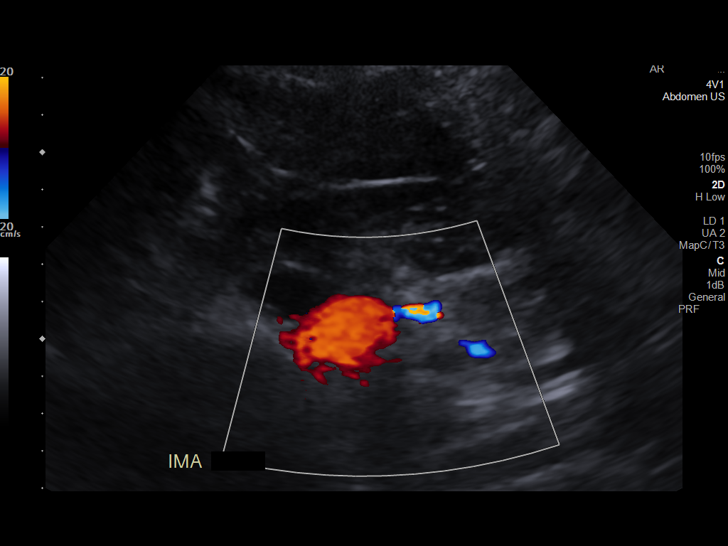
[im 21/26]
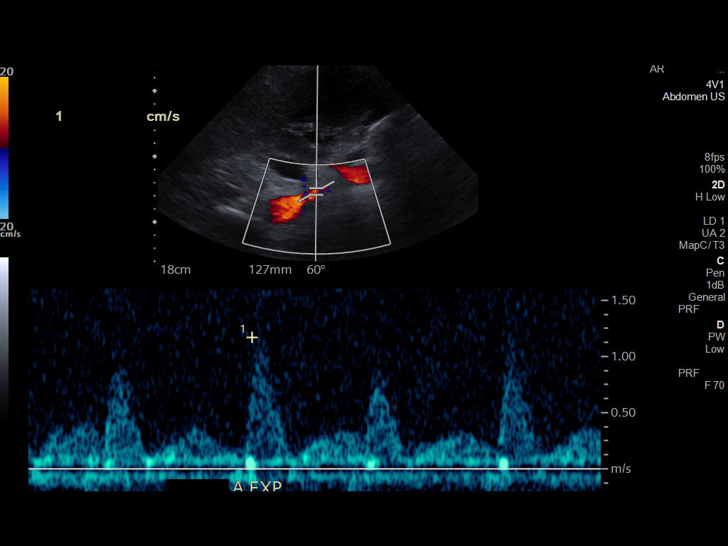
[im 22/26]
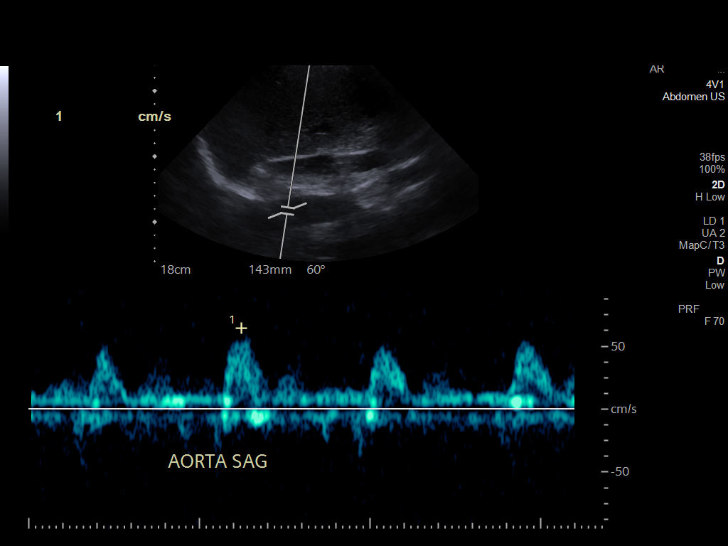
[im 24/26]
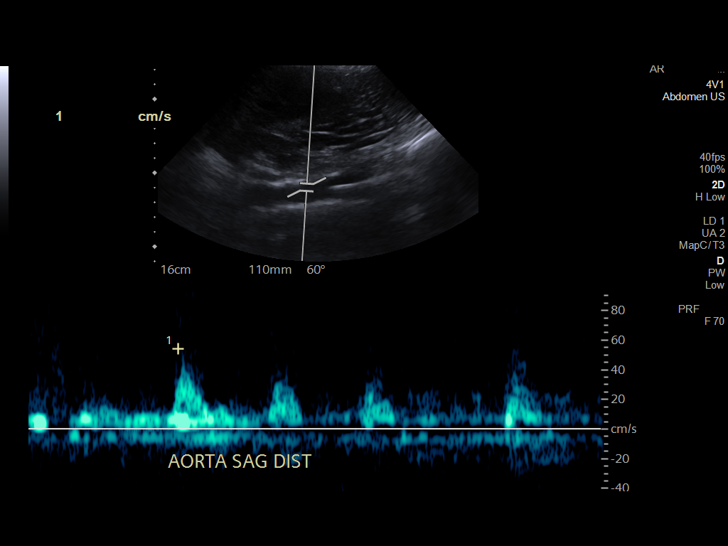
[im 26/26]
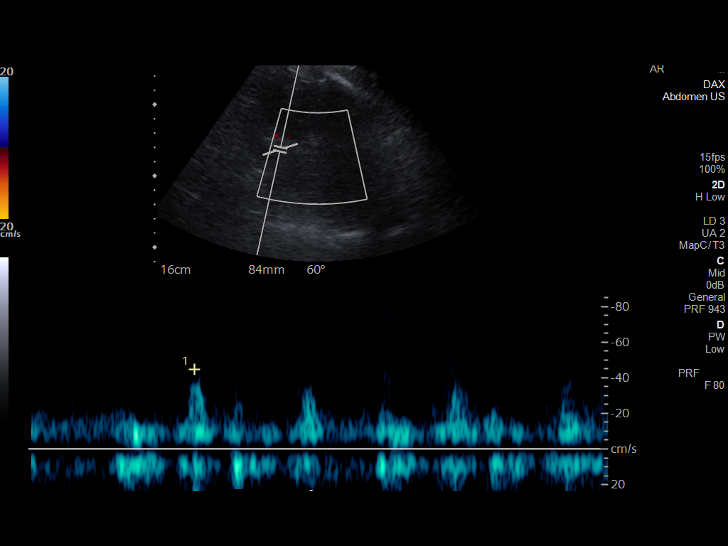

[14 of 16 positions shown; findings below may reference images not displayed]

FINDINGS: Celiac axis: 179 cm/sec

Celiac axis with inspiration: 190 cm/sec

Celiac axis with expiration: 117 cm/sec

Splenic artery: 45 cm/sec

Hepatic artery: 128 cm/sec

SMA: 204 centimeters/second proximally. 182 centimeters/second
distally.

IMA: 190 cm/sec

Aorta: 64 cm/sec

Aortic size: 1.8 cm proximally, 1.5 cm in the mid segment and 1.2 cm
distally
IMPRESSION: No evidence of hemodynamically significant visceral artery stenosis.

## 2022-11-10 NOTE — Progress Notes (Signed)
Daughter made aware of results 

## 2022-11-17 ENCOUNTER — Ambulatory Visit (HOSPITAL_COMMUNITY)
Admission: RE | Admit: 2022-11-17 | Discharge: 2022-11-17 | Disposition: A | Discharge status: Home / Self Care | Payer: PPO | Source: Ambulatory Visit | Attending: Acute Care | Admitting: Acute Care

## 2022-11-17 DIAGNOSIS — Z930 Tracheostomy status: Secondary | ICD-10-CM | POA: Diagnosis present

## 2022-11-17 DIAGNOSIS — J386 Stenosis of larynx: Secondary | ICD-10-CM | POA: Diagnosis present

## 2022-11-17 DIAGNOSIS — J9611 Chronic respiratory failure with hypoxia: Secondary | ICD-10-CM | POA: Diagnosis present

## 2022-11-17 DIAGNOSIS — Z9911 Dependence on respirator [ventilator] status: Secondary | ICD-10-CM

## 2022-11-17 DIAGNOSIS — E662 Morbid (severe) obesity with alveolar hypoventilation: Secondary | ICD-10-CM | POA: Diagnosis present

## 2022-11-17 NOTE — Progress Notes (Addendum)
Tracheostomy Procedure Note  [name] [id number] Jul 04, 1951  Pre Procedure Tracheostomy Information  Trach Brand: Shiley Size:  6.0 [id number] Style: Cuffed Secured by: Velcro   Procedure: Trach Change and Trach Cleaning 1% Lidocaine Neb  given prior to tach chang   Post Procedure Tracheostomy Information  Trach Brand: Shiley Size:  6.0 [id number] Style: Cuffed Secured by: Velcro   Post Procedure Evaluation:  ETCO2 positive color change from yellow to purple : Yes.   Vital signs:VSS Patients current condition: stable Complications: No apparent complications Trach site exam: clean, dry Wound care done: 4 x 4  drain gauze Patient did tolerate procedure well.   Education: none  Prescription needs: none    Additional needs: none

## 2022-11-17 NOTE — Progress Notes (Signed)
Reason for visit Planned trach change  HPI 72 year old pt well known to me. Follow her for trach dependence for OHS,OSA on nocturnal ventilation. I last saw her Aug 2023. She has a more complicated trach hx for on-going aphonia for which I referred to her [facility] ENT.   Recent ENT Hx -Referred to ENT May 2023 for subglottis stenosis. subglottic mass removed  -direct laryngoscopy and airway evaluation in September 2023.  Large subglottic posteriorly-based granuloma which was eminating superiorly through the glottis, biopsied and send for permanent pathology; thick circumferential subglottic granulations tissue, which was removed with cupped forceps and coblator  Much improved subglottic airway following the procedure F/u 11/8: had been doing better initially post-op.  he visualized subglottis and proximal trachea are patent and the Shiley tracheostomy tube is visible in the subglottis the trachea is edematous and there does not appear to be much room around the tracheostomy device they recommended fenestrated trach which we placed.  This was subsequently discontinued as the patient did not tolerate it.  Presents today for planned trach change   Review of Systems  Constitutional:  Negative for chills, fever, malaise/fatigue and weight loss.  HENT: Negative.    Eyes: Negative.   Respiratory: Negative.    Cardiovascular: Negative.   Gastrointestinal: Negative.   Genitourinary: Negative.   Musculoskeletal: Negative.   Skin: Negative.   Neurological: Negative.   Endo/Heme/Allergies: Negative.    Exam General: 72 year old female patient well-known to me presents today for planned tracheostomy change HEENT normocephalic atraumatic no jugular venous distention has size 6 cuffed tracheostomy in place, she actually has present phonation today, vocal quality poor however audible this is a change compared to baseline.  She received a different version of the tracheostomy PMV, named PMV 200 for some  reason she is tolerating this much better than her old PMV and is able to phonate she does still have air trapping when the valve is in place for extended periods of time or if she talks for prolonged period of time however she is at least able to phonate at this point Pulmonary: Clear to auscultation Cardiac: Regular rate and rhythm Abdomen: Soft nontender Extremities: Warm dry brisk cap refill  Procedure Trach change The patient was premedicated with inhaled lidocaine, following this the old tracheostomy was removed, tracheostomy site was inspected and was unremarkable.  A new tracheostomy was placed over obturator, placement was checked via end-tidal CO2 patient tolerated well.   Active Problems:   Tracheostomy status (HCC)   Tracheostomy dependence (HCC)   Subglottic stenosis   Dependence on home ventilator (HCC)   Obesity hypoventilation syndrome (HCC)   Chronic respiratory failure with hypoxia and hypercapnia (HCC)       Tracheostomy status (HCC) Overview:  Trach Brand: Shiley Size:  6.0 [id number] Style: Cuffed Secured by: Velcro  Last change 4/18 (prior); most recent 5/16  Discussion Stable tracheostomy.  Not a candidate for decannulation due to subglottic stenosis.  She is tolerating PMV for short periods of time which is fantastic  Plan Continue routine trach care ROV 4 weeks for planned tracheostomy change Continue nocturnal ventilation  Plan      My time 16 min  Simonne Martinet ACNP-BC [facility] Pulmonary/Critical Care Pager # [phone] OR # [phone] if no answer

## 2022-11-18 ENCOUNTER — Inpatient Hospital Stay (HOSPITAL_COMMUNITY): Admission: RE | Admit: 2022-11-18 | Payer: PPO | Source: Ambulatory Visit

## 2022-11-21 ENCOUNTER — Other Ambulatory Visit: Payer: Self-pay | Admitting: Gastroenterology

## 2022-11-21 DIAGNOSIS — K580 Irritable bowel syndrome with diarrhea: Secondary | ICD-10-CM

## 2022-12-23 ENCOUNTER — Inpatient Hospital Stay: Payer: PPO | Attending: Hematology

## 2022-12-23 DIAGNOSIS — D509 Iron deficiency anemia, unspecified: Secondary | ICD-10-CM | POA: Insufficient documentation

## 2022-12-23 DIAGNOSIS — D508 Other iron deficiency anemias: Secondary | ICD-10-CM

## 2022-12-23 LAB — CBC WITH DIFFERENTIAL/PLATELET
Abs Immature Granulocytes: 0.03 10*3/uL (ref 0.00–0.07)
Basophils Absolute: 0.1 10*3/uL (ref 0.0–0.1)
Basophils Relative: 1 %
Eosinophils Absolute: 0.1 10*3/uL (ref 0.0–0.5)
Eosinophils Relative: 2 %
HCT: 34.9 % — ABNORMAL LOW (ref 36.0–46.0)
Hemoglobin: 10.9 g/dL — ABNORMAL LOW (ref 12.0–15.0)
Immature Granulocytes: 1 %
Lymphocytes Relative: 31 %
Lymphs Abs: 1.7 10*3/uL (ref 0.7–4.0)
MCH: 30.6 pg (ref 26.0–34.0)
MCHC: 31.2 g/dL (ref 30.0–36.0)
MCV: 98 fL (ref 80.0–100.0)
Monocytes Absolute: 0.5 10*3/uL (ref 0.1–1.0)
Monocytes Relative: 9 %
Neutro Abs: 3.2 10*3/uL (ref 1.7–7.7)
Neutrophils Relative %: 56 %
Platelets: 166 10*3/uL (ref 150–400)
RBC: 3.56 MIL/uL — ABNORMAL LOW (ref 3.87–5.11)
RDW: 17.4 % — ABNORMAL HIGH (ref 11.5–15.5)
WBC: 5.5 10*3/uL (ref 4.0–10.5)
nRBC: 0 % (ref 0.0–0.2)

## 2022-12-23 LAB — FERRITIN: Ferritin: 162 ng/mL (ref 11–307)

## 2022-12-23 LAB — IRON AND TIBC
Iron: 99 ug/dL (ref 28–170)
Saturation Ratios: 36 % — ABNORMAL HIGH (ref 10.4–31.8)
TIBC: 274 ug/dL (ref 250–450)
UIBC: 175 ug/dL

## 2022-12-23 LAB — VITAMIN B12: Vitamin B-12: 2018 pg/mL — ABNORMAL HIGH (ref 180–914)

## 2022-12-27 ENCOUNTER — Encounter: Payer: Self-pay | Admitting: Family Medicine

## 2022-12-27 ENCOUNTER — Ambulatory Visit (INDEPENDENT_AMBULATORY_CARE_PROVIDER_SITE_OTHER): Payer: PPO | Admitting: Family Medicine

## 2022-12-27 VITALS — BP 120/60 | HR 73 | Temp 98.1°F | Ht 66.0 in | Wt 178.0 lb

## 2022-12-27 DIAGNOSIS — Z930 Tracheostomy status: Secondary | ICD-10-CM

## 2022-12-27 DIAGNOSIS — I10 Essential (primary) hypertension: Secondary | ICD-10-CM

## 2022-12-27 DIAGNOSIS — Z13220 Encounter for screening for lipoid disorders: Secondary | ICD-10-CM

## 2022-12-27 DIAGNOSIS — E1159 Type 2 diabetes mellitus with other circulatory complications: Secondary | ICD-10-CM | POA: Diagnosis not present

## 2022-12-27 DIAGNOSIS — Z794 Long term (current) use of insulin: Secondary | ICD-10-CM

## 2022-12-27 DIAGNOSIS — E538 Deficiency of other specified B group vitamins: Secondary | ICD-10-CM

## 2022-12-27 DIAGNOSIS — E559 Vitamin D deficiency, unspecified: Secondary | ICD-10-CM | POA: Diagnosis not present

## 2022-12-27 DIAGNOSIS — Z1329 Encounter for screening for other suspected endocrine disorder: Secondary | ICD-10-CM

## 2022-12-27 DIAGNOSIS — Z131 Encounter for screening for diabetes mellitus: Secondary | ICD-10-CM

## 2022-12-27 DIAGNOSIS — I739 Peripheral vascular disease, unspecified: Secondary | ICD-10-CM

## 2022-12-27 DIAGNOSIS — R569 Unspecified convulsions: Secondary | ICD-10-CM

## 2022-12-27 NOTE — Assessment & Plan Note (Addendum)
Initial visit  Last recorded Hemoglobin A1c 7.6 on 5/22 Labs ordered today awaiting results will follow up. Patient reporting taking Lantus 20-24 units daily at bedtime and Novolin Flexpen 2-5 units 3 time daily before meals per sliding scale. Discussed medication desired effects, potential side effects, and how to administer the medication. Nonpharmacological interventions such as low carb diet,high in protein, vegetables and fruit discussed. Educated on importance of physical activity 150 minutes per week. Discussed signs and symptoms of hypoglycemia, & hyperglycemia and need to present to the ED if symptoms occurs.Follow up in 3 months or sooner if needed. Patient verbalizes understanding regarding plan of care and all questions answered. Ophthalmology exam current Feb 2024, Foot exam completed

## 2022-12-27 NOTE — Patient Instructions (Signed)
        Great to see you today.  I have refilled the medication(s) we provide.    If labs were collected, we will inform you of lab results once received either by echart message or telephone call.   - echart message- for normal results that have been seen by the patient already.   - telephone call: abnormal results or if patient has not viewed results in their echart.   - Please take medications as prescribed. - Follow up with your primary health provider if any health concerns arises. - If symptoms worsen please contact your primary care provider and/or visit the emergency department.  

## 2022-12-27 NOTE — Progress Notes (Signed)
New Patient Office Visit   Subjective   Patient ID: [name], female    DOB: 29-Jul-1950  Age: 72 y.o. MRN: [id number]  CC:  Chief Complaint  Patient presents with   Establish Care    HPI [name] 72 year old female,presents to establish care. She  has a past medical history of Abnormal pulmonary function test, Anemia, Anxiety, Arthritis, Barrett's esophagus, Chest pain, Chronic anticoagulation, Chronic combined systolic and diastolic CHF (congestive heart failure) (HCC), Chronic LBP, Diabetes mellitus, type 2 (HCC), Gastroparesis, GERD (gastroesophageal reflux disease), Heart attack (HCC) (08/13/2020), Hiatal hernia, Hyperlipidemia, Hypertension, Hypothyroid, IBS (irritable bowel syndrome), Obesity, OSA on CPAP, Paroxysmal atrial fibrillation (HCC), Pulmonary hypertension (HCC) (01/2015), Seizures (HCC), and Syncope.  Diabetes She presents for her initial diabetic visit. She has type 2 diabetes mellitus. No MedicAlert identification noted. Hypoglycemia symptoms include tremors. Pertinent negatives for hypoglycemia include no dizziness, mood changes or nervousness/anxiousness. Associated symptoms include fatigue, foot paresthesias and weakness. Pertinent negatives for diabetes include no blurred vision, no chest pain and no foot ulcerations. Pertinent negatives for hypoglycemia complications include no blackouts. Diabetic complications include heart disease, peripheral neuropathy and PVD. Risk factors for coronary artery disease include diabetes mellitus, dyslipidemia, hypertension and sedentary lifestyle. Current diabetic treatment includes insulin injections and diet. She is compliant with treatment all of the time. She is currently taking insulin at bedtime and pre-lunch. Insulin injections are given by patient. Rotation sites for injection include the abdominal wall. She is following a diabetic diet. She has not had a previous visit with a dietitian. She rarely participates in  exercise. Her home blood glucose trend is fluctuating dramatically. Her breakfast blood glucose range is generally 110-130 mg/dl. Her lunch blood glucose range is generally 180-200 mg/dl. Her dinner blood glucose range is generally 180-200 mg/dl. Her bedtime blood glucose range is generally >200 mg/dl.      Outpatient Encounter Medications as of 12/27/2022  Medication Sig   apixaban (ELIQUIS) 5 MG TABS tablet Take 1 tablet (5 mg total) by mouth 2 (two) times daily.   atorvastatin (LIPITOR) 80 MG tablet Take 1 tablet (80 mg total) by mouth daily.   Cholecalciferol (VITAMIN D3) 50 MCG (2000 UT) capsule Take 2,000 Units by mouth daily.   Continuous Glucose Sensor (FREESTYLE LIBRE 2 SENSOR) MISC by Does not apply route.   dicyclomine (BENTYL) 10 MG capsule TAKE 1 CAPSULE BY MOUTH UP TO FOUR TIMES DAILY BEFORE MEALS AND AT BEDTIME (STOP IF CONSTIPATION)   donepezil (ARICEPT) 5 MG tablet Take 1 tablet (5 mg total) by mouth at bedtime.   DULoxetine (CYMBALTA) 30 MG capsule Take 1 capsule (30 mg total) by mouth daily. (Patient taking differently: Take 30 mg by mouth 2 (two) times daily.)   furosemide (LASIX) 40 MG tablet Take 1 tablet (40 mg total) by mouth daily. May take an additional 20 mg ( 0.5 tablet) as needed for swelling.   gabapentin (NEURONTIN) 100 MG capsule Take 1 capsule (100 mg total) by mouth 3 (three) times daily.   insulin glargine (LANTUS) 100 UNIT/ML injection Inject 0.15 mLs (15 Units total) into the skin daily. (Patient taking differently: Inject 20-24 Units into the skin at bedtime.)   ipratropium-albuterol (DUONEB) 0.5-2.5 (3) MG/3ML SOLN Take 3 mLs by nebulization every 4 (four) hours as needed (Asthma).   levETIRAcetam (KEPPRA) 500 MG tablet Take 500 mg by mouth 2 (two) times daily.   levothyroxine (SYNTHROID) 175 MCG tablet Take 1 tablet (175 mcg total) by mouth daily  at 6 (six) AM.   loperamide (IMODIUM) 2 MG capsule Take by mouth.   LORazepam (ATIVAN) 1 MG tablet Take 1 mg by  mouth at bedtime.   melatonin 3 MG TABS tablet Take 1 tablet (3 mg total) by mouth at bedtime as needed.   metoprolol tartrate (LOPRESSOR) 25 MG tablet Take 1 tablet (25 mg total) by mouth 2 (two) times daily.   Multiple Vitamins-Minerals (MULTIVITAMIN WITH MINERALS) tablet Take 1 tablet by mouth daily. For Diabetics   nitroGLYCERIN (NITROSTAT) 0.4 MG SL tablet    NOVOLIN R FLEXPEN RELION 100 UNIT/ML FlexPen Inject 2-5 Units into the skin 3 (three) times daily before meals. Per sliding scale   pantoprazole (PROTONIX) 40 MG tablet Take 1 tablet (40 mg total) by mouth 2 (two) times daily before a meal.   potassium chloride (KLOR-CON) 10 MEQ tablet Take 20 mEq by mouth daily.   sertraline (ZOLOFT) 25 MG tablet Take 1 tablet (25 mg total) by mouth daily.   traMADol (ULTRAM) 50 MG tablet Take by mouth.   trolamine salicylate (ASPERCREME) 10 % cream Apply 1 application. topically as needed (knee pain).   [DISCONTINUED] vitamin B-12 (CYANOCOBALAMIN) 1000 MCG tablet Take 1,000 mcg by mouth daily. (Patient not taking: Reported on 12/27/2022)   No facility-administered encounter medications on file as of 12/27/2022.    Past Surgical History:  Procedure Laterality Date   BACK SURGERY  1996   BIOPSY N/A 11/08/2013   Procedure: BIOPSY  / Tissue sampling / ulcers present in small intestine;  Surgeon: West Bali, MD;  Location: [facility];  Service: Endoscopy;  Laterality: N/A;   CARDIAC CATHETERIZATION  2002   CARDIAC CATHETERIZATION N/A 01/26/2015   Procedure: Right Heart Cath;  Surgeon: Laurey Morale, MD;  Location: [facility] INVASIVE CV LAB;  Service: Cardiovascular;  Laterality: N/A;   CARDIOVASCULAR STRESS TEST  2008   Stress nuclear study   CARDIOVERSION N/A 03/06/2015   Procedure: CARDIOVERSION;  Surgeon: Laurey Morale, MD;  Location: [facility] ENDOSCOPY;  Service: Cardiovascular;  Laterality: N/A;   CARPAL TUNNEL RELEASE  1994   CATARACT EXTRACTION W/PHACO Right 01/10/2022   Procedure: CATARACT  EXTRACTION PHACO AND INTRAOCULAR LENS PLACEMENT (IOC);  Surgeon: [name], MD;  Location: [facility] ORS;  Service: Ophthalmology;  Laterality: Right;  CDE: 21.51   CATARACT EXTRACTION W/PHACO Left 01/24/2022   Procedure: CATARACT EXTRACTION PHACO AND INTRAOCULAR LENS PLACEMENT (IOC);  Surgeon: [name], MD;  Location: [facility] ORS;  Service: Ophthalmology;  Laterality: Left;  CDE: 17.13   COLONOSCOPY  11/2011   Dr. Darrick Penna: Internal hemorrhoids, mild diverticulosis. Random colon biopsies negative.   COLONOSCOPY N/A 11/08/2013   SLF: Normal mucosa in the terminal ileum/The colon IS redundant/  Moderate diverticulosis throughout the entire colon. ileum bx benign. colon bx benign   CRYOTHERAPY  01/23/2021   Procedure: CRYOTHERAPY;  Surgeon: Lorin Glass, MD;  Location: [facility] ENDOSCOPY;  Service: Pulmonary;;   CRYOTHERAPY  01/24/2021   Procedure: Cloyde Reams;  Surgeon: Lorin Glass, MD;  Location: [facility] ENDOSCOPY;  Service: Pulmonary;;   CRYOTHERAPY  01/26/2021   Procedure: Cloyde Reams;  Surgeon: Lupita Leash, MD;  Location: [facility] ENDOSCOPY;  Service: Cardiopulmonary;;   ESOPHAGOGASTRODUODENOSCOPY  2008   Barrett's without dysplasia. esphagus dilated. antral erosions, h.pylori serologies negative.   ESOPHAGOGASTRODUODENOSCOPY  11/2011   Dr. Darrick Penna: Barrett's esophagus, mild gastritis, diverticulum in the second portion of the duodenum repeat EGD 3 years. Small bowel biopsies negative. Gastric biopsy show reactive gastropathy but no H. pylori.  Esophageal biopsies consistent with GERD. Next EGD 11/2014   ESOPHAGOGASTRODUODENOSCOPY N/A 11/21/2014   ZOX:WRUE non-erosive gastritis/irregular z-line   ESOPHAGOGASTRODUODENOSCOPY  03/2022   FOREIGN BODY REMOVAL  01/23/2021   Procedure: FOREIGN BODY REMOVAL;  Surgeon: Lorin Glass, MD;  Location: [facility] ENDOSCOPY;  Service: Pulmonary;;   FOREIGN BODY REMOVAL  01/24/2021   Procedure: FOREIGN BODY REMOVAL;  Surgeon: Lorin Glass, MD;  Location: [facility]  ENDOSCOPY;  Service: Pulmonary;;   GIVENS CAPSULE STUDY  12/07/2011   Proximal small bowel, rare AVM. Distal small bowel, multiple ulcers noted   GIVENS CAPSULE STUDY N/A 09/27/2013   Distal small bowel ulcers extending to TI.   GIVENS CAPSULE STUDY N/A 10/10/2013   Procedure: GIVENS CAPSULE STUDY;  Surgeon: West Bali, MD;  Location: [facility];  Service: Endoscopy;  Laterality: N/A;  7:30   HEMOSTASIS CONTROL  01/24/2021   Procedure: HEMOSTASIS CONTROL;  Surgeon: Lorin Glass, MD;  Location: [facility] ENDOSCOPY;  Service: Pulmonary;;   HEMOSTASIS CONTROL  01/26/2021   Procedure: HEMOSTASIS CONTROL;  Surgeon: Lupita Leash, MD;  Location: [facility] ENDOSCOPY;  Service: Cardiopulmonary;;   IR GASTROSTOMY TUBE MOD SED  01/11/2021   IR GASTROSTOMY TUBE REMOVAL  04/13/2021   IR KYPHO THORACIC WITH BONE BIOPSY  02/09/2018   KNEE ARTHROSCOPY WITH MEDIAL MENISECTOMY Right 06/09/2016   Procedure: KNEE ARTHROSCOPY WITH MEDIAL MENISECTOMY;  Surgeon: Vickki Hearing, MD;  Location: [facility]S;  Service: Orthopedics;  Laterality: Right;  medial and lateral menisectomy   LAMINECTOMY  1995   L4-L5   LAPAROSCOPIC CHOLECYSTECTOMY  1990s   LEFT HEART CATHETERIZATION WITH CORONARY ANGIOGRAM  01/10/2014   Procedure: LEFT HEART CATHETERIZATION WITH CORONARY ANGIOGRAM;  Surgeon: Lesleigh Noe, MD;  Location: [facility] CATH LAB;  Service: Cardiovascular;;   MICROLARYNGOSCOPY WITH LASER AND BALLOON DILATION N/A 11/17/2021   Procedure: MICROLARYNGOSCOPY WITH CO2 LASER, SUBGLOTTIC LARYNGEAL MASS BIOSPY;  Surgeon: Serena Colonel, MD;  Location: [facility] OR;  Service: ENT;  Laterality: N/A;   RIGHT HEART CATHETERIZATION N/A 01/10/2014   Procedure: RIGHT HEART CATH;  Surgeon: Lesleigh Noe, MD;  Location: [facility] CATH LAB;  Service: Cardiovascular;  Laterality: N/A;   TOTAL ABDOMINAL HYSTERECTOMY  1999   TRACHEOSTOMY REVISION N/A 07/18/2021   Procedure: TRACHEOSTOMY REVISION;  Surgeon: Suzanna Obey, MD;  Location: [facility] OR;   Service: ENT;  Laterality: N/A;   TRACHEOSTOMY TUBE PLACEMENT N/A 09/01/2017   Procedure: TRACHEOSTOMY;  Surgeon: Drema Halon, MD;  Location: [facility] OR;  Service: ENT;  Laterality: N/A;   VIDEO BRONCHOSCOPY Bilateral 01/23/2021   Procedure: VIDEO BRONCHOSCOPY WITHOUT FLUORO;  Surgeon: Lorin Glass, MD;  Location: [facility] ENDOSCOPY;  Service: Pulmonary;  Laterality: Bilateral;   VIDEO BRONCHOSCOPY Bilateral 01/24/2021   Procedure: VIDEO BRONCHOSCOPY WITHOUT FLUORO;  Surgeon: Lorin Glass, MD;  Location: [facility] ENDOSCOPY;  Service: Pulmonary;  Laterality: Bilateral;   VIDEO BRONCHOSCOPY N/A 01/26/2021   Procedure: VIDEO BRONCHOSCOPY WITHOUT FLUORO;  Surgeon: Lupita Leash, MD;  Location: [facility] ENDOSCOPY;  Service: Cardiopulmonary;  Laterality: N/A;    Review of Systems  Constitutional:  Positive for fatigue. Negative for chills and fever.  HENT:  Negative for ear pain.   Eyes:  Negative for blurred vision.  Cardiovascular:  Negative for chest pain.  Gastrointestinal:  Negative for abdominal pain.  Genitourinary:  Negative for dysuria.  Skin:  Negative for itching and rash.  Neurological:  Positive for tremors and weakness. Negative for dizziness.  Psychiatric/Behavioral:  The patient is not nervous/anxious.  Objective    BP 120/60   Pulse 73   Temp 98.1 F (36.7 C) (Oral)   Ht 5\' 6"  (1.676 m)   Wt 178 lb (80.7 kg)   SpO2 95% Comment: 2L  BMI 28.73 kg/m   Physical Exam Vitals reviewed.  Constitutional:      General: She is not in acute distress.    Appearance: Normal appearance. She is not ill-appearing, toxic-appearing or diaphoretic.  HENT:     Head: Normocephalic.  Eyes:     General:        Right eye: No discharge.        Left eye: No discharge.     Conjunctiva/sclera: Conjunctivae normal.  Cardiovascular:     Rate and Rhythm: Normal rate.     Pulses: Normal pulses.  Pulmonary:     Effort: No respiratory distress.     Breath sounds: Decreased breath  sounds present. No wheezing.     Comments:  Tracheostomy stoma clean and dry Chest:     Chest wall: No tenderness.  Abdominal:     General: Bowel sounds are normal.     Palpations: Abdomen is soft.     Tenderness: There is no abdominal tenderness. There is no right CVA tenderness, left CVA tenderness or guarding.  Musculoskeletal:     Cervical back: Normal range of motion.     Right lower leg: No edema.     Left lower leg: No edema.     Right foot: Abnormal pulse.     Left foot: Abnormal pulse.     Comments: Bilateral foot with purplish tinge color, shiny skin with coolness to palpation  Skin:    General: Skin is warm and dry.     Capillary Refill: Capillary refill takes less than 2 seconds.  Neurological:     General: No focal deficit present.     Mental Status: She is alert and oriented to person, place, and time.     Coordination: Coordination normal.     Gait: Gait normal.  Psychiatric:        Mood and Affect: Mood normal.        Behavior: Behavior normal.       Assessment & Plan:  Type 2 diabetes mellitus with other circulatory complication, with long-term current use of insulin (HCC) Assessment & Plan: Initial visit  Last recorded Hemoglobin A1c 7.6 on 5/22 Labs ordered today awaiting results will follow up. Patient reporting taking Lantus 20-24 units daily at bedtime and Novolin Flexpen 2-5 units 3 time daily before meals per sliding scale. Discussed medication desired effects, potential side effects, and how to administer the medication. Nonpharmacological interventions such as low carb diet,high in protein, vegetables and fruit discussed. Educated on importance of physical activity 150 minutes per week. Discussed signs and symptoms of hypoglycemia, & hyperglycemia and need to present to the ED if symptoms occurs.Follow up in 3 months or sooner if needed. Patient verbalizes understanding regarding plan of care and all questions answered. Ophthalmology exam current Feb  2024, Foot exam completed  Orders: -     Ambulatory referral to Endocrinology  Primary hypertension Assessment & Plan: Vitals:   12/27/22 1338 12/27/22 1350  BP: (!) 109/58 120/60   Blood pressure controlled in today's visit, Labs ordered Continue Lopressor 25 mg twice daily Continued discussion on DASH diet, low sodium diet and maintain a exercise routine for 150 minutes per week.   Orders: -     Microalbumin / creatinine urine ratio -  CMP14+EGFR -     CBC with Differential/Platelet  Screening for lipid disorders -     Lipid panel  Screening for diabetes mellitus -     Hemoglobin A1c  Screening for thyroid disorder -     TSH + free T4  Vitamin D deficiency -     VITAMIN D 25 Hydroxy (Vit-D Deficiency, Fractures)  Vitamin B 12 deficiency -     Vitamin B12  Seizures (HCC) -     Ambulatory referral to Neurology  PAD (peripheral artery disease) (HCC) -     Ambulatory Referral for Peripheral Vascular Consult  Tracheostomy dependence (HCC) -     Ambulatory referral to Home Health    Return in about 3 months (around 03/29/2023) for chronic follow-up, hypertension.   Cruzita Lederer Newman Nip, FNP

## 2022-12-27 NOTE — Assessment & Plan Note (Addendum)
Vitals:   12/27/22 1338 12/27/22 1350  BP: (!) 109/58 120/60   Blood pressure controlled in today's visit, Labs ordered Continue Lopressor 25 mg twice daily Continued discussion on DASH diet, low sodium diet and maintain a exercise routine for 150 minutes per week.

## 2022-12-28 ENCOUNTER — Inpatient Hospital Stay: Payer: PPO | Admitting: Oncology

## 2022-12-28 VITALS — BP 97/57 | HR 76 | Temp 99.7°F | Resp 18

## 2022-12-28 DIAGNOSIS — D509 Iron deficiency anemia, unspecified: Secondary | ICD-10-CM | POA: Diagnosis not present

## 2022-12-28 DIAGNOSIS — D500 Iron deficiency anemia secondary to blood loss (chronic): Secondary | ICD-10-CM | POA: Diagnosis not present

## 2022-12-28 MED ORDER — CYANOCOBALAMIN 500 MCG PO TABS: 500.0000 ug | ORAL_TABLET | Freq: Every day | ORAL | 3 refills | Status: DC

## 2022-12-28 NOTE — Progress Notes (Signed)
[facility] [address], [place] [address]   Clinic Day:  12/28/2022  Referring physician: Gareth Morgan, MD  Patient Care Team: Jennette Banker, Tenna Child, FNP as PCP - General (Family Medicine) Wyline Mood, Dorothe Pea, MD as PCP - Cardiology (Cardiology) West Bali, MD (Inactive) as Consulting Physician (Gastroenterology) Galen Manila, Novella Olive, MD (Inactive) as Consulting Physician (Hematology and Oncology) Lanelle Bal, DO as Consulting Physician (Internal Medicine)   CHIEF COMPLAINT/PURPOSE OF CONSULT:   Diagnosis: Iron deficiency anemia  HISTORY OF PRESENT ILLNESS:   [name] is a 72 y.o. female presenting to clinic today for evaluation of iron deficiency anemia. She was last seen in clinic on 10/27/2022 to review lab results.  Received 1 g iron on 11/02/22 and tolerated well. Has noticed some improvement in her energy since. Has stopped taking B12 supplements since her last visit.   Today, she states that she is doing well overall. Her appetite level is at 100%. Her energy level is at 80%. Endorses some right knee pain that is stable. Presents today with her daughter. She denies any nausea, vomiting or abdominal pain. Her bowel habits are unchanged without recurrent episodes of diarrhea or constipation. She denies easy bruising or signs of active bleeding. She does have lower extremity neuropathy that is chronic. She denies fevers, chills, sweats, shortness of breath, chest pain or cough. She has no other complaints.   PAST MEDICAL HISTORY:   Past Medical History: Past Medical History:  Diagnosis Date   Abnormal pulmonary function test    Anemia    H/H of 10/30 with a normal MCV in 12/09   Anxiety    Arthritis    Barrett's esophagus    Diagnosed 1995. Last EGD 2016-NO BARRETT'S.    Chest pain    a. 2022 Cath: nl cors; b. 2008 Neg myoview; c. 01/2014 Cath: Nl cors. EF 40%; c. 08/2020 NSTEMI/Cath [facility]): nl cors.   Chronic anticoagulation    Chronic  combined systolic and diastolic CHF (congestive heart failure) (HCC)    a. 10/2014 Echo: EF40%; b. 01/2015 Echo: EF 55-60%; c. 11/2020 Echo: EF 55-60%, mod LVH, sev BAE, mild MR, mod TR. PASP ; d. 01/2021 Echo: EF 45-50%, glob HK, mod conc LVH, mod reduced RV fxn, RVSP 60.5mmHg, Mod TR, mild to mod MR, sev dil LA.   Chronic LBP    Surgical intervention in 1996   Diabetes mellitus, type 2 (HCC)    Insulin therapy; exacerbated by prednisone   Gastroparesis    99% retention 05/2008 on GES   GERD (gastroesophageal reflux disease)    Heart attack (HCC) 08/13/2020   Hiatal hernia    Hyperlipidemia    Hypertension    Hypothyroid    IBS (irritable bowel syndrome)    Obesity    OSA on CPAP    had CPAP and cannot tolerate.   Paroxysmal atrial fibrillation (HCC)    a.CHA2DS2VASc = 6-->eliquis. Also on Amio.   Pulmonary hypertension (HCC) 01/2015   a. 01/2015 RHC: Predominantly pulmonary venous hypertension but may be component of PAH (PA mean 52, PCWP 31); b. 01/2021 Echo: RVSP 60.68mmHg w/ mod reduced RV fxn.   Seizures (HCC)    last seizure was 2 years ago; on keppra for this; unknown etiology   Syncope    a. Admitted 05/2009; magnetic resonance imagin/ MRA - negative; etiology thought to be orthostasis secondary to drugs and dehydration. b. Syncope 02/2015 also felt 2/2 dehydration.    Surgical History: Past  Surgical History:  Procedure Laterality Date   BACK SURGERY  1996   BIOPSY N/A 11/08/2013   Procedure: BIOPSY  / Tissue sampling / ulcers present in small intestine;  Surgeon: West Bali, MD;  [facility]: [facility];  Service: Endoscopy;  Laterality: N/A;   CARDIAC CATHETERIZATION  2002   CARDIAC CATHETERIZATION N/A 01/26/2015   Procedure: Right Heart Cath;  Surgeon: Laurey Morale, MD;  Location: [facility] INVASIVE CV LAB;  Service: Cardiovascular;  Laterality: N/A;   CARDIOVASCULAR STRESS TEST  2008   Stress nuclear study   CARDIOVERSION N/A 03/06/2015   Procedure: CARDIOVERSION;   Surgeon: Laurey Morale, MD;  Location: [facility] ENDOSCOPY;  Service: Cardiovascular;  Laterality: N/A;   CARPAL TUNNEL RELEASE  1994   CATARACT EXTRACTION W/PHACO Right 01/10/2022   Procedure: CATARACT EXTRACTION PHACO AND INTRAOCULAR LENS PLACEMENT (IOC);  Surgeon: [name], MD;  Location: [facility] ORS;  Service: Ophthalmology;  Laterality: Right;  CDE: 21.51   CATARACT EXTRACTION W/PHACO Left 01/24/2022   Procedure: CATARACT EXTRACTION PHACO AND INTRAOCULAR LENS PLACEMENT (IOC);  Surgeon: [name], MD;  Location: [facility] ORS;  Service: Ophthalmology;  Laterality: Left;  CDE: 17.13   COLONOSCOPY  11/2011   Dr. Darrick Penna: Internal hemorrhoids, mild diverticulosis. Random colon biopsies negative.   COLONOSCOPY N/A 11/08/2013   SLF: Normal mucosa in the terminal ileum/The colon IS redundant/  Moderate diverticulosis throughout the entire colon. ileum bx benign. colon bx benign   CRYOTHERAPY  01/23/2021   Procedure: CRYOTHERAPY;  Surgeon: Lorin Glass, MD;  Location: [facility] ENDOSCOPY;  Service: Pulmonary;;   CRYOTHERAPY  01/24/2021   Procedure: Cloyde Reams;  Surgeon: Lorin Glass, MD;  Location: [facility] ENDOSCOPY;  Service: Pulmonary;;   CRYOTHERAPY  01/26/2021   Procedure: Cloyde Reams;  Surgeon: Lupita Leash, MD;  Location: [facility] ENDOSCOPY;  Service: Cardiopulmonary;;   ESOPHAGOGASTRODUODENOSCOPY  2008   Barrett's without dysplasia. esphagus dilated. antral erosions, h.pylori serologies negative.   ESOPHAGOGASTRODUODENOSCOPY  11/2011   Dr. Darrick Penna: Barrett's esophagus, mild gastritis, diverticulum in the second portion of the duodenum repeat EGD 3 years. Small bowel biopsies negative. Gastric biopsy show reactive gastropathy but no H. pylori. Esophageal biopsies consistent with GERD. Next EGD 11/2014   ESOPHAGOGASTRODUODENOSCOPY N/A 11/21/2014   UXL:KGMW non-erosive gastritis/irregular z-line   ESOPHAGOGASTRODUODENOSCOPY  03/2022   FOREIGN BODY REMOVAL  01/23/2021   Procedure: FOREIGN BODY REMOVAL;   Surgeon: Lorin Glass, MD;  Location: [facility] ENDOSCOPY;  Service: Pulmonary;;   FOREIGN BODY REMOVAL  01/24/2021   Procedure: FOREIGN BODY REMOVAL;  Surgeon: Lorin Glass, MD;  Location: [facility] ENDOSCOPY;  Service: Pulmonary;;   GIVENS CAPSULE STUDY  12/07/2011   Proximal small bowel, rare AVM. Distal small bowel, multiple ulcers noted   GIVENS CAPSULE STUDY N/A 09/27/2013   Distal small bowel ulcers extending to TI.   GIVENS CAPSULE STUDY N/A 10/10/2013   Procedure: GIVENS CAPSULE STUDY;  Surgeon: West Bali, MD;  [facility]: [facility];  Service: Endoscopy;  Laterality: N/A;  7:30   HEMOSTASIS CONTROL  01/24/2021   Procedure: HEMOSTASIS CONTROL;  Surgeon: Lorin Glass, MD;  Location: [facility] ENDOSCOPY;  Service: Pulmonary;;   HEMOSTASIS CONTROL  01/26/2021   Procedure: HEMOSTASIS CONTROL;  Surgeon: Lupita Leash, MD;  Location: [facility] ENDOSCOPY;  Service: Cardiopulmonary;;   IR GASTROSTOMY TUBE MOD SED  01/11/2021   IR GASTROSTOMY TUBE REMOVAL  04/13/2021   IR KYPHO THORACIC WITH BONE BIOPSY  02/09/2018   KNEE ARTHROSCOPY WITH MEDIAL MENISECTOMY Right 06/09/2016  Procedure: KNEE ARTHROSCOPY WITH MEDIAL MENISECTOMY;  Surgeon: Vickki Hearing, MD;  Location: [facility] ORS;  Service: Orthopedics;  Laterality: Right;  medial and lateral menisectomy   LAMINECTOMY  1995   L4-L5   LAPAROSCOPIC CHOLECYSTECTOMY  1990s   LEFT HEART CATHETERIZATION WITH CORONARY ANGIOGRAM  01/10/2014   Procedure: LEFT HEART CATHETERIZATION WITH CORONARY ANGIOGRAM;  Surgeon: Lesleigh Noe, MD;  Location: [facility] CATH LAB;  Service: Cardiovascular;;   MICROLARYNGOSCOPY WITH LASER AND BALLOON DILATION N/A 11/17/2021   Procedure: MICROLARYNGOSCOPY WITH CO2 LASER, SUBGLOTTIC LARYNGEAL MASS BIOSPY;  Surgeon: Serena Colonel, MD;  Location: [facility] OR;  Service: ENT;  Laterality: N/A;   RIGHT HEART CATHETERIZATION N/A 01/10/2014   Procedure: RIGHT HEART CATH;  Surgeon: Lesleigh Noe, MD;  Location: [facility] CATH LAB;  Service:  Cardiovascular;  Laterality: N/A;   TOTAL ABDOMINAL HYSTERECTOMY  1999   TRACHEOSTOMY REVISION N/A 07/18/2021   Procedure: TRACHEOSTOMY REVISION;  Surgeon: Suzanna Obey, MD;  Location: [facility] OR;  Service: ENT;  Laterality: N/A;   TRACHEOSTOMY TUBE PLACEMENT N/A 09/01/2017   Procedure: TRACHEOSTOMY;  Surgeon: Drema Halon, MD;  Location: [facility] OR;  Service: ENT;  Laterality: N/A;   VIDEO BRONCHOSCOPY Bilateral 01/23/2021   Procedure: VIDEO BRONCHOSCOPY WITHOUT FLUORO;  Surgeon: Lorin Glass, MD;  Location: [facility] ENDOSCOPY;  Service: Pulmonary;  Laterality: Bilateral;   VIDEO BRONCHOSCOPY Bilateral 01/24/2021   Procedure: VIDEO BRONCHOSCOPY WITHOUT FLUORO;  Surgeon: Lorin Glass, MD;  Location: [facility] ENDOSCOPY;  Service: Pulmonary;  Laterality: Bilateral;   VIDEO BRONCHOSCOPY N/A 01/26/2021   Procedure: VIDEO BRONCHOSCOPY WITHOUT FLUORO;  Surgeon: Lupita Leash, MD;  Location: [facility] ENDOSCOPY;  Service: Cardiopulmonary;  Laterality: N/A;    Social History: Social History   Socioeconomic History   Marital status: Married    Spouse name: Not on file   Number of children: Not on file   Years of education: Not on file   Highest education level: Not on file  Occupational History   Occupation: Passenger transport manager at [employer]    Employer: [employer]    Comment: disabled    Employer: RETIRED  Tobacco Use   Smoking status: Former    Packs/day: 0.25    Years: 15.00    Additional pack years: 0.00    Total pack years: 3.75    Types: Cigarettes    Start date: 02/26/1966    Quit date: 06/30/1993    Years since quitting: 29.5   Smokeless tobacco: Never   Tobacco comments:    quit in 1984  Vaping Use   Vaping Use: Never used  Substance and Sexual Activity   Alcohol use: Not Currently    Comment: Occasional alcohol 30 years ago (09/02/21)   Drug use: No   Sexual activity: Yes    Birth control/protection: None, Surgical  Other Topics Concern   Not on file  Social History Narrative   Sedentary   4  children, "blended family"   Social Determinants of Health   Financial Resource Strain: Not on file  Food Insecurity: No Food Insecurity (10/11/2022)   Hunger Vital Sign    Worried About Running Out of Food in the Last Year: Never true    Ran Out of Food in the Last Year: Never true  Transportation Needs: No Transportation Needs (10/11/2022)   PRAPARE - Administrator, Civil Service (Medical): No    Lack of Transportation (Non-Medical): No  Physical Activity: Not on file  Stress: Not on file  Social Connections:  Not on file  Intimate Partner Violence: Not At Risk (10/11/2022)   Humiliation, Afraid, Rape, and Kick questionnaire    Fear of Current or Ex-Partner: No    Emotionally Abused: No    Physically Abused: No    Sexually Abused: No    Family History: Family History  Problem Relation Age of Onset   Hypertension Mother    Alzheimer's disease Mother    Stroke Mother    Heart attack Mother    Breast cancer Sister    Hypertension Other    Heart disease Neg Hx    Colon cancer Neg Hx    Liver disease Neg Hx     Current Medications:  Current Outpatient Medications:    apixaban (ELIQUIS) 5 MG TABS tablet, Take 1 tablet (5 mg total) by mouth 2 (two) times daily., Disp: , Rfl:    atorvastatin (LIPITOR) 80 MG tablet, Take 1 tablet (80 mg total) by mouth daily., Disp: , Rfl:    Cholecalciferol (VITAMIN D3) 50 MCG (2000 UT) capsule, Take 2,000 Units by mouth daily., Disp: , Rfl:    Continuous Glucose Sensor (FREESTYLE LIBRE 2 SENSOR) MISC, by Does not apply route., Disp: , Rfl:    dicyclomine (BENTYL) 10 MG capsule, TAKE 1 CAPSULE BY MOUTH UP TO FOUR TIMES DAILY BEFORE MEALS AND AT BEDTIME (STOP IF CONSTIPATION), Disp: 120 capsule, Rfl: 3   donepezil (ARICEPT) 5 MG tablet, Take 1 tablet (5 mg total) by mouth at bedtime., Disp: , Rfl:    DULoxetine (CYMBALTA) 30 MG capsule, Take 1 capsule (30 mg total) by mouth daily. (Patient taking differently: Take 30 mg by mouth 2 (two)  times daily.), Disp: , Rfl: 3   furosemide (LASIX) 40 MG tablet, Take 1 tablet (40 mg total) by mouth daily. May take an additional 20 mg ( 0.5 tablet) as needed for swelling., Disp: 90 tablet, Rfl: 3   gabapentin (NEURONTIN) 100 MG capsule, Take 1 capsule (100 mg total) by mouth 3 (three) times daily., Disp: 90 capsule, Rfl: 3   insulin glargine (LANTUS) 100 UNIT/ML injection, Inject 0.15 mLs (15 Units total) into the skin daily. (Patient taking differently: Inject 20-24 Units into the skin at bedtime.), Disp: 10 mL, Rfl: 11   ipratropium-albuterol (DUONEB) 0.5-2.5 (3) MG/3ML SOLN, Take 3 mLs by nebulization every 4 (four) hours as needed (Asthma)., Disp: , Rfl:    levETIRAcetam (KEPPRA) 500 MG tablet, Take 500 mg by mouth 2 (two) times daily., Disp: , Rfl:    levothyroxine (SYNTHROID) 175 MCG tablet, Take 1 tablet (175 mcg total) by mouth daily at 6 (six) AM., Disp: , Rfl:    loperamide (IMODIUM) 2 MG capsule, Take by mouth., Disp: , Rfl:    LORazepam (ATIVAN) 1 MG tablet, Take 1 mg by mouth at bedtime., Disp: , Rfl:    melatonin 3 MG TABS tablet, Take 1 tablet (3 mg total) by mouth at bedtime as needed., Disp: , Rfl: 0   metoprolol tartrate (LOPRESSOR) 25 MG tablet, Take 1 tablet (25 mg total) by mouth 2 (two) times daily., Disp: , Rfl:    Multiple Vitamins-Minerals (MULTIVITAMIN WITH MINERALS) tablet, Take 1 tablet by mouth daily. For Diabetics, Disp: , Rfl:    nitroGLYCERIN (NITROSTAT) 0.4 MG SL tablet, , Disp: , Rfl:    NOVOLIN R FLEXPEN RELION 100 UNIT/ML FlexPen, Inject 2-5 Units into the skin 3 (three) times daily before meals. Per sliding scale, Disp: , Rfl:    pantoprazole (PROTONIX) 40 MG tablet, Take 1 tablet (  40 mg total) by mouth 2 (two) times daily before a meal., Disp: 60 tablet, Rfl: 3   potassium chloride (KLOR-CON) 10 MEQ tablet, Take 20 mEq by mouth daily., Disp: , Rfl:    sertraline (ZOLOFT) 25 MG tablet, Take 1 tablet (25 mg total) by mouth daily., Disp: , Rfl:    traMADol  (ULTRAM) 50 MG tablet, Take by mouth., Disp: , Rfl:    trolamine salicylate (ASPERCREME) 10 % cream, Apply 1 application. topically as needed (knee pain)., Disp: , Rfl:    Allergies: Allergies  Allergen Reactions   Celexa [Citalopram Hydrobromide] Other (See Comments)    Dyskinesia    Codeine Nausea And Vomiting and Other (See Comments)    HALLUCINATIONS     Dilaudid [Hydromorphone Hcl] Other (See Comments)    Made her pass out    Reglan [Metoclopramide] Other (See Comments)    DYSKINESIA    Hyoscyamine Other (See Comments)    MADE DIARRHEA WORSE    REVIEW OF SYSTEMS:   Review of Systems  Constitutional:  Negative for appetite change, fatigue, fever and unexpected weight change.  HENT:   Negative for nosebleeds, sore throat and trouble swallowing.   Eyes: Negative.   Respiratory: Negative.  Negative for cough, shortness of breath and wheezing.   Cardiovascular: Negative.  Negative for chest pain and leg swelling.  Gastrointestinal:  Negative for abdominal pain, blood in stool, constipation, diarrhea, nausea and vomiting.  Endocrine: Negative.   Genitourinary: Negative.  Negative for bladder incontinence, hematuria and nocturia.   Musculoskeletal: Negative.  Negative for back pain and flank pain.  Skin: Negative.   Neurological:  Positive for dizziness. Negative for headaches, light-headedness and numbness.  Hematological: Negative.   Psychiatric/Behavioral: Negative.  Negative for confusion. The patient is not nervous/anxious.      VITALS:   There were no vitals taken for this visit.  Wt Readings from Last 3 Encounters:  12/27/22 178 lb (80.7 kg)  10/27/22 182 lb (82.6 kg)  10/11/22 179 lb (81.2 kg)    There is no height or weight on file to calculate BMI.   PHYSICAL EXAM:   Physical Exam Vitals reviewed.  Constitutional:      Appearance: Normal appearance.  Cardiovascular:     Rate and Rhythm: Normal rate and regular rhythm.     Heart sounds: Normal heart  sounds.  Pulmonary:     Effort: Pulmonary effort is normal.     Breath sounds: Normal breath sounds.  Abdominal:     Palpations: There is no hepatomegaly or splenomegaly.  Musculoskeletal:     Right lower leg: No edema.     Left lower leg: No edema.  Lymphadenopathy:     Cervical: No cervical adenopathy.     Right cervical: No superficial, deep or posterior cervical adenopathy.    Left cervical: No superficial, deep or posterior cervical adenopathy.     Upper Body:     Right upper body: No supraclavicular or axillary adenopathy.     Left upper body: No supraclavicular or axillary adenopathy.  Neurological:     General: No focal deficit present.     Mental Status: She is alert and oriented to person, place, and time.  Psychiatric:        Mood and Affect: Mood normal.        Behavior: Behavior normal.     LABS:      Latest Ref Rng & Units 12/23/2022    2:00 PM 10/11/2022    2:30 PM  11/17/2021    7:23 AM  CBC  WBC 4.0 - 10.5 K/uL 5.5  7.2  10.6   Hemoglobin 12.0 - 15.0 g/dL 16.1  09.6  9.7   Hematocrit 36.0 - 46.0 % 34.9  32.8  30.8   Platelets 150 - 400 K/uL 166  182  198       Latest Ref Rng & Units 10/11/2022    2:30 PM 11/17/2021    7:23 AM 07/19/2021    4:17 AM  CMP  Glucose 70 - 99 mg/dL 045  409  88   BUN 8 - 23 mg/dL 40  18  23   Creatinine 0.44 - 1.00 mg/dL 8.11  9.14  7.82   Sodium 135 - 145 mmol/L 138  140  142   Potassium 3.5 - 5.1 mmol/L 4.2  3.8  3.9   Chloride 98 - 111 mmol/L 107  111  113   CO2 22 - 32 mmol/L 24  23  21    Calcium 8.9 - 10.3 mg/dL 9.2  9.3  9.1   Total Protein 6.5 - 8.1 g/dL 7.1     Total Bilirubin 0.3 - 1.2 mg/dL 0.7     Alkaline Phos 38 - 126 U/L 83     AST 15 - 41 U/L 22     ALT 0 - 44 U/L 20        No results found for: "CEA1", "CEA" / No results found for: "CEA1", "CEA" No results found for: "PSA1" No results found for: "CAN199" No results found for: "CAN125"  Lab Results  Component Value Date   TOTALPROTELP 7.0 10/11/2022    TOTALPROTELP 6.7 10/11/2022   ALBUMINELP 3.8 10/11/2022   A1GS 0.2 10/11/2022   A2GS 0.7 10/11/2022   BETS 1.0 10/11/2022   GAMS 1.3 10/11/2022   MSPIKE Not Observed 10/11/2022   SPEI Comment 10/11/2022   Lab Results  Component Value Date   TIBC 274 12/23/2022   TIBC 393 10/11/2022   TIBC 260 04/08/2020   FERRITIN 162 12/23/2022   FERRITIN 13 10/11/2022   FERRITIN 58 04/08/2020   IRONPCTSAT 36 (H) 12/23/2022   IRONPCTSAT 11 10/11/2022   IRONPCTSAT 16 04/08/2020   Lab Results  Component Value Date   LDH 157 10/11/2022   LDH 157 05/11/2018     STUDIES:   No results found.    ASSESSMENT & PLAN:   Assessment:  1.  Normocytic anemia: - She had a history of iron deficiency anemia in the past, status post Injectafer, last given in November 2019. - She had 2 units of blood transfusion during recent hospitalization. - She had tracheotomy done as she cannot tolerate CPAP machine for sleep apnea. - She had 1 episode of rectal bleeding recently secondary to constipation.  Otherwise no bleeding per rectum or melena.  No ice pica.  2.  Social/family history: - She is seen with her daughter [name] who is her caregiver.  Patient walks with the help of walker.  She is able to dress herself.  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  Quit smoking in 1994. - Sister had breast cancer.  Another sister had leukemia.  Maternal uncle had stomach cancer.  Plan:  1.  Normocytic anemia: -Received 1 g Monoferric on 11/02/2022 for ferritin of 13, saturation 11%. -Reviewed lab work from 12/23/2022 showed ferritin of 162 with a saturation of 36%.  Hemoglobin has  improved to 10.9 (10.1).  Differential is normal. -Continues to deny any overt bleeding. -B12 level has decreased to 2018 [phone]).  Continue to hold B12 supplement at this time. -Recommend follow-up in 8-12 weeks with repeat labs CBC,  ferritin, iron saturation, vitamin B12.   2. General health Maintenance: -Mammogram from 10/26/2022 showed BI-RADS Category 0 due to calcifications. -Repeat mammogram from 11/08/2022 was read as BI-RADS Category 1 negative.  Patient expressed understanding of the plan provided.   PLAN SUMMARY: >> Return to clinic in 8-12 weeks for follow-up with labs (CBC with differential, ferritin, iron saturation, vitamin B-12 level and CMP).      I spent 20 minutes dedicated to the care of this patient (face-to-face and non-face-to-face) on the date of the encounter to include what is described in the assessment and plan.  Durenda Hurt, NP 12/28/2022 12:50 PM

## 2022-12-29 LAB — CBC WITH DIFFERENTIAL/PLATELET
Basophils Absolute: 0.1 10*3/uL (ref 0.0–0.2)
Basos: 1 %
Eos: 3 %
Hematocrit: 34.6 % (ref 34.0–46.6)
Lymphs: 39 %
MCV: 94 fL (ref 79–97)
Neutrophils Absolute: 2.2 10*3/uL (ref 1.4–7.0)
Neutrophils: 47 %
RBC: 3.68 x10E6/uL — ABNORMAL LOW (ref 3.77–5.28)
WBC: 4.7 10*3/uL (ref 3.4–10.8)

## 2022-12-29 LAB — TSH+FREE T4
Free T4: 1.58 ng/dL (ref 0.82–1.77)
TSH: 5.4 u[IU]/mL — ABNORMAL HIGH (ref 0.450–4.500)

## 2022-12-29 LAB — CMP14+EGFR
BUN/Creatinine Ratio: 30 — ABNORMAL HIGH (ref 12–28)
Chloride: 106 mmol/L (ref 96–106)
Potassium: 4 mmol/L (ref 3.5–5.2)
Total Protein: 6.9 g/dL (ref 6.0–8.5)

## 2022-12-29 LAB — HEMOGLOBIN A1C: Est. average glucose Bld gHb Est-mCnc: 214 mg/dL

## 2022-12-29 LAB — MICROALBUMIN / CREATININE URINE RATIO

## 2022-12-29 LAB — VITAMIN D 25 HYDROXY (VIT D DEFICIENCY, FRACTURES): Vit D, 25-Hydroxy: 27.2 ng/mL — ABNORMAL LOW (ref 30.0–100.0)

## 2022-12-29 LAB — LIPID PANEL: Cholesterol, Total: 90 mg/dL — ABNORMAL LOW (ref 100–199)

## 2022-12-30 LAB — CMP14+EGFR
ALT: 20 IU/L (ref 0–32)
AST: 22 IU/L (ref 0–40)
Albumin: 4.3 g/dL (ref 3.8–4.8)
Alkaline Phosphatase: 89 IU/L (ref 44–121)
BUN: 33 mg/dL — ABNORMAL HIGH (ref 8–27)
Bilirubin Total: 0.5 mg/dL (ref 0.0–1.2)
CO2: 21 mmol/L (ref 20–29)
Calcium: 9.2 mg/dL (ref 8.7–10.3)
Creatinine, Ser: 1.09 mg/dL — ABNORMAL HIGH (ref 0.57–1.00)
Globulin, Total: 2.6 g/dL (ref 1.5–4.5)
Glucose: 220 mg/dL — ABNORMAL HIGH (ref 70–99)
Sodium: 139 mmol/L (ref 134–144)
eGFR: 54 mL/min/{1.73_m2} — ABNORMAL LOW (ref >59)

## 2022-12-30 LAB — CBC WITH DIFFERENTIAL/PLATELET
EOS (ABSOLUTE): 0.1 10*3/uL (ref 0.0–0.4)
Hemoglobin: 11 g/dL — ABNORMAL LOW (ref 11.1–15.9)
Immature Grans (Abs): 0 10*3/uL (ref 0.0–0.1)
Immature Granulocytes: 0 %
Lymphocytes Absolute: 1.8 10*3/uL (ref 0.7–3.1)
MCH: 29.9 pg (ref 26.6–33.0)
MCHC: 31.8 g/dL (ref 31.5–35.7)
Monocytes Absolute: 0.5 10*3/uL (ref 0.1–0.9)
Monocytes: 10 %
Platelets: 100 10*3/uL — CL (ref 150–450)
RDW: 15.7 % — ABNORMAL HIGH (ref 11.7–15.4)

## 2022-12-30 LAB — HEMOGLOBIN A1C: Hgb A1c MFr Bld: 9.1 % — ABNORMAL HIGH (ref 4.8–5.6)

## 2022-12-30 LAB — LIPID PANEL
Chol/HDL Ratio: 2.1 ratio (ref 0.0–4.4)
HDL: 43 mg/dL (ref >39)
LDL Chol Calc (NIH): 31 mg/dL (ref 0–99)
Triglycerides: 78 mg/dL (ref 0–149)
VLDL Cholesterol Cal: 16 mg/dL (ref 5–40)

## 2022-12-30 LAB — MICROALBUMIN / CREATININE URINE RATIO: Creatinine, Urine: 78.4 mg/dL

## 2023-01-04 ENCOUNTER — Ambulatory Visit (HOSPITAL_COMMUNITY)
Admission: RE | Admit: 2023-01-04 | Discharge: 2023-01-04 | Disposition: A | Discharge status: Home / Self Care | Payer: PPO | Source: Ambulatory Visit | Attending: Acute Care | Admitting: Acute Care

## 2023-01-04 DIAGNOSIS — Z9911 Dependence on respirator [ventilator] status: Secondary | ICD-10-CM | POA: Diagnosis not present

## 2023-01-04 DIAGNOSIS — J386 Stenosis of larynx: Secondary | ICD-10-CM | POA: Diagnosis not present

## 2023-01-04 DIAGNOSIS — Z930 Tracheostomy status: Secondary | ICD-10-CM | POA: Insufficient documentation

## 2023-01-04 DIAGNOSIS — E662 Morbid (severe) obesity with alveolar hypoventilation: Secondary | ICD-10-CM | POA: Diagnosis present

## 2023-01-04 DIAGNOSIS — G4733 Obstructive sleep apnea (adult) (pediatric): Secondary | ICD-10-CM | POA: Diagnosis present

## 2023-01-04 NOTE — Progress Notes (Signed)
Tracheostomy Procedure Note  ELDINA SLISZ [id number] May 15, 1951  Pre Procedure Tracheostomy Information  Trach Brand: Shiley Size:  6.0 [id number] Style: Cuffed Secured by: Velcro   Procedure: Trach Change and Trach Cleaning    Post Procedure Tracheostomy Information  Trach Brand: Shiley Size:  6.0 [id number] Style: Cuffed Secured by: Velcro   Post Procedure Evaluation:  ETCO2 positive color change from yellow to purple : Yes.   Vital signs:VSS Patients current condition: stable Complications: No apparent complications Trach site exam: clean, dry Wound care done: 4 x 4 gauze drain Patient did tolerate procedure well.   Education: none  Prescription needs: none    Additional needs: none

## 2023-01-04 NOTE — Progress Notes (Signed)
Reason for visit Planned trach change  HPI 72 year old pt well known to me. Follow her for trach dependence for OHS,OSA on nocturnal ventilation. I last saw her Aug 2023. She has a more complicated trach hx for on-going aphonia for which I referred to her [facility].   Recent ENT Hx -Referred to ENT May 2023 for subglottis stenosis. subglottic mass removed  -direct laryngoscopy and airway evaluation in September 2023.  Large subglottic posteriorly-based granuloma which was eminating superiorly through the glottis, biopsied and send for permanent pathology; thick circumferential subglottic granulations tissue, which was removed with cupped forceps and coblator  Much improved subglottic airway following the procedure F/u 11/8: had been doing better initially post-op.  he visualized subglottis and proximal trachea are patent and the Shiley tracheostomy tube is visible in the subglottis the trachea is edematous and there does not appear to be much room around the tracheostomy device they recommended fenestrated trach which we placed.  This was subsequently discontinued as the patient did not tolerate it.  Presents today for planned trach change   Review of Systems  Constitutional: Negative.   HENT: Negative.         Does have some trach site discomfort   Eyes: Negative.   Respiratory: Negative.    Cardiovascular: Negative.   Gastrointestinal: Negative.   Genitourinary: Negative.   Musculoskeletal: Negative.   Skin: Negative.   Neurological: Negative.   Endo/Heme/Allergies: Negative.    Exam General this is a 73 yof who is trach dependent. No distress today  HENT NCAT # 6 cuffed trach is unremarkable. Phonation quality raspy and quiet but audible.  Pulm occ rhonchi that improve w/ sxn Card rrr Abd soft Ext warm  Neuro intact   Procedure Trach change The patient was premedicated with inhaled lidocaine, following this the old tracheostomy was removed, tracheostomy site was inspected  and was unremarkable.  A new tracheostomy was placed over obturator, placement was checked via end-tidal CO2 patient tolerated well.    Principal Problem:   Tracheostomy status (HCC) Active Problems:   Subglottic stenosis   OSA (obstructive sleep apnea)   Dependence on home ventilator (HCC)   Obesity hypoventilation syndrome (HCC)       Tracheostomy status (HCC) Overview:  Trach Brand: Shiley Size:  6.0 [id number] Style: Cuffed Secured by: Velcro  Last change 5/17 (prior); most recent 7/3  Discussion Stable tracheostomy.  Not a candidate for decannulation due to subglottic stenosis. She's a little off schedule for her usual changes due to travel. I suspect that this is just because she was due to be changed or perhaps the trach was a little malpositioned. She is tolerating PMV well for short periods of time which is fantastic  Plan Continue routine trach care ROV 4 weeks for planned tracheostomy change Continue nocturnal ventilation       My time 20 min   Simonne Martinet ACNP-BC [facility] Pulmonary/Critical Care Pager # [phone] OR # [phone] if no answer

## 2023-01-08 IMAGING — CT CT ABD-PELV W/O CM
2 of 4 series · 13 of 36 positions shown, 16 images · non-contrast
Comparison: None.

CLINICAL DATA: Status post fall, unresponsive.

EXAM:
CT CHEST, ABDOMEN AND PELVIS WITHOUT CONTRAST
TECHNIQUE: Multidetector CT imaging of the chest, abdomen and pelvis was
performed following the standard protocol without IV contrast.

[Series 2: cap without · axial · non-contrast · 0.93mm/px · z∈[-402,+138]mm · 10 of 132 slices shown, 13 images]
[im 12/132  mediastinal]
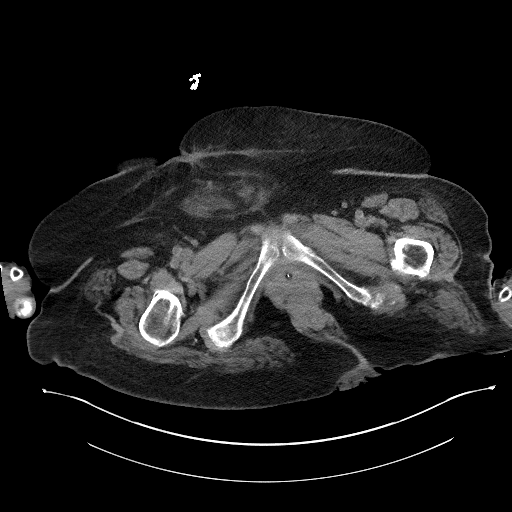
[im 12/132  lung]
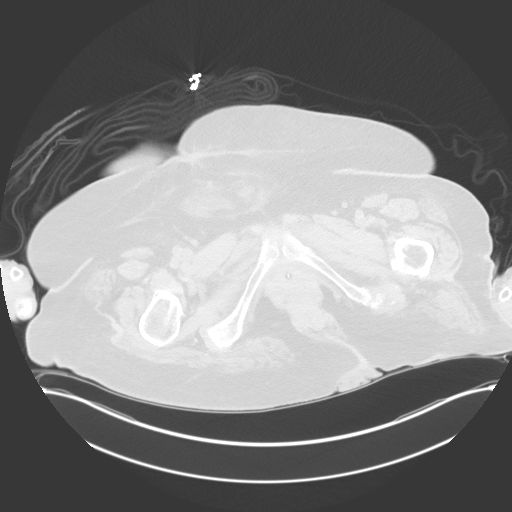
[im 24/132  lung]
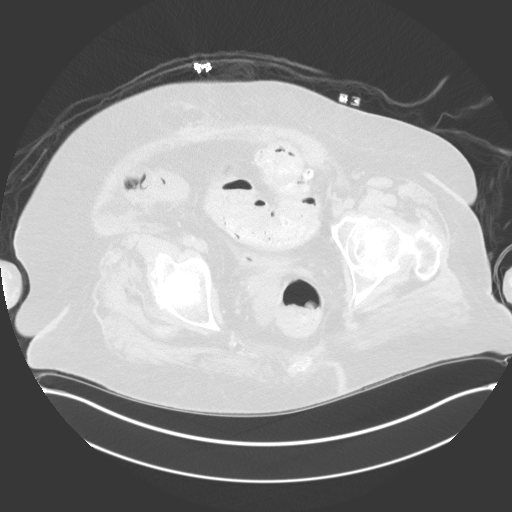
[im 36/132  lung]
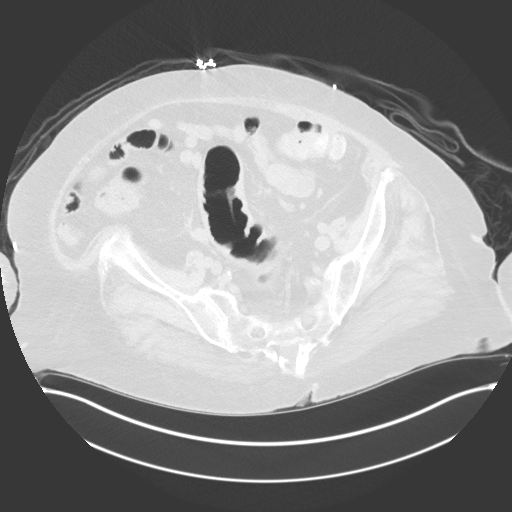
[im 48/132  lung]
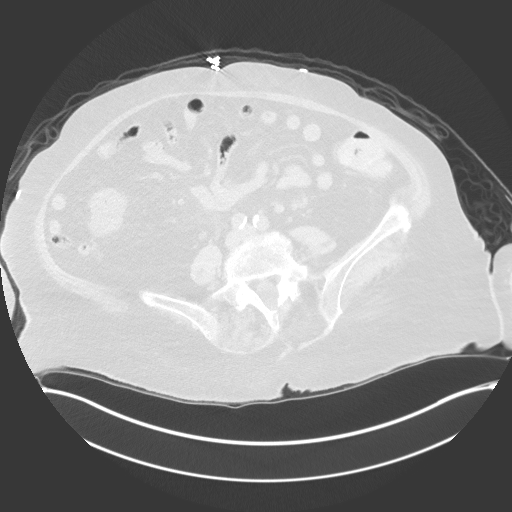
[im 60/132  mediastinal]
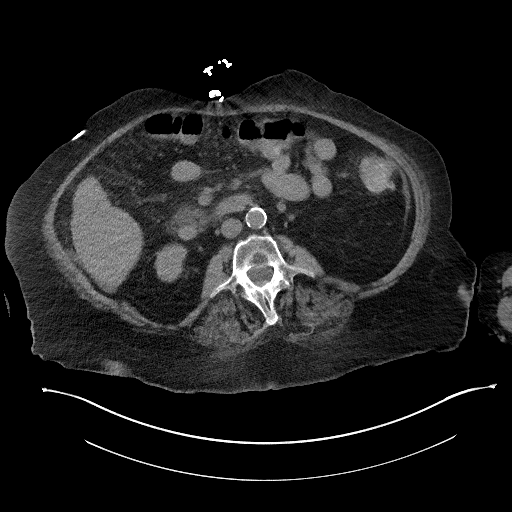
[im 60/132  lung]
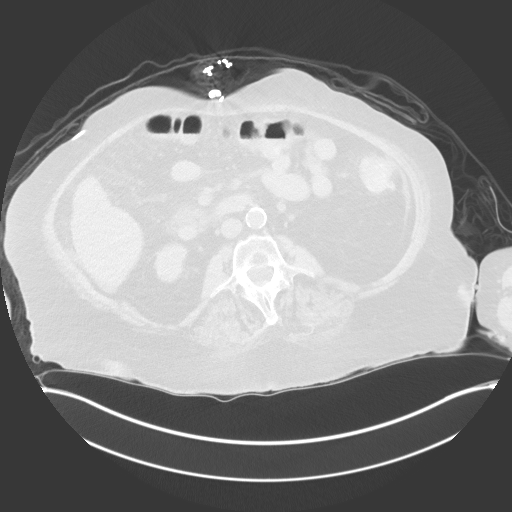
[im 72/132  lung]
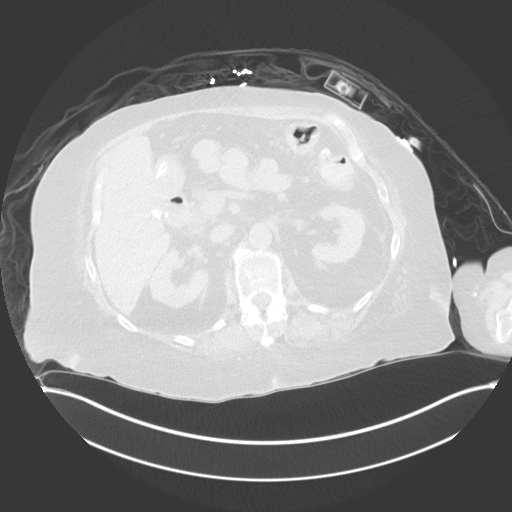
[im 84/132  lung]
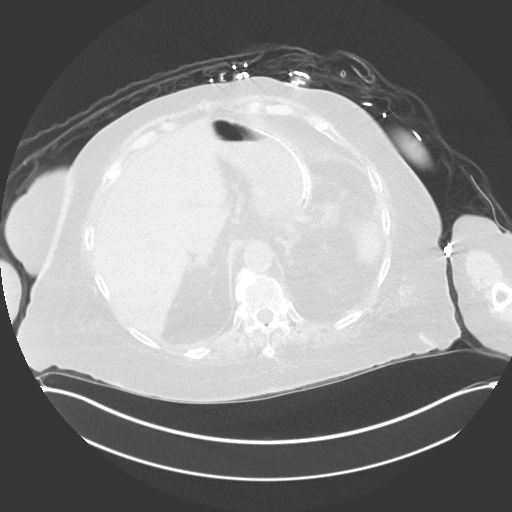
[im 96/132  lung]
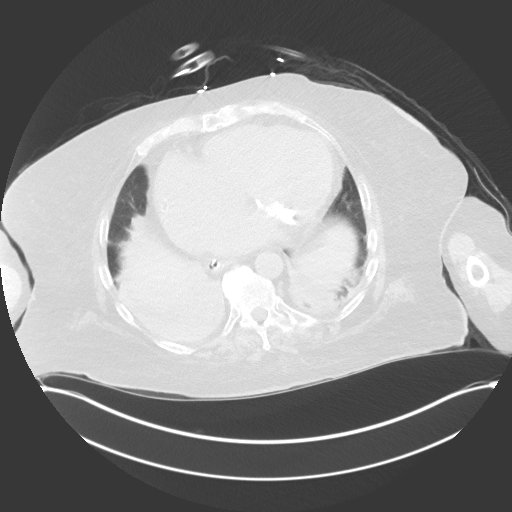
[im 108/132  mediastinal]
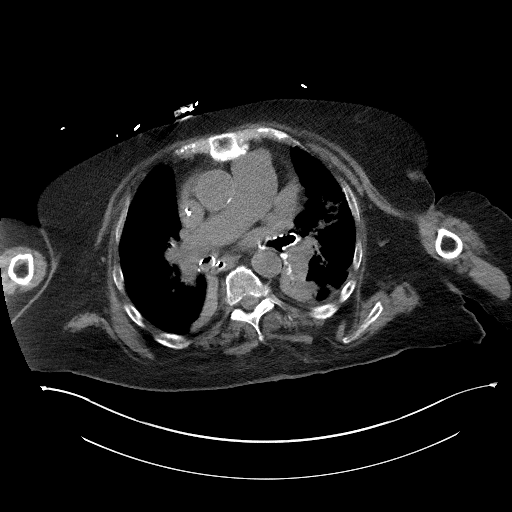
[im 108/132  lung]
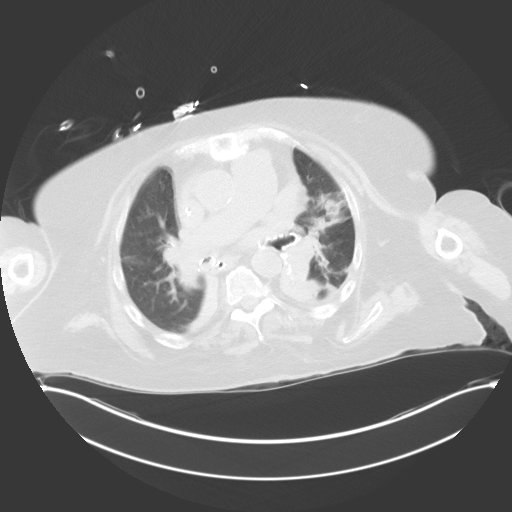
[im 120/132  lung]
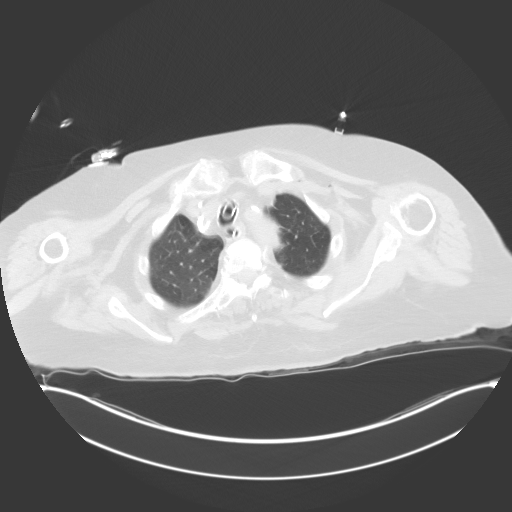

[Series 5: coronal · coronal · 0.94mm/px · 3 of 177 slices shown]
[im 36/177  lung]
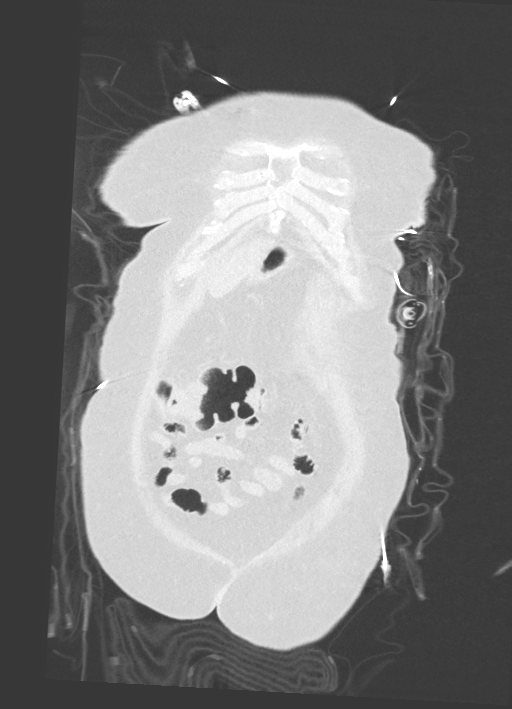
[im 71/177  lung]
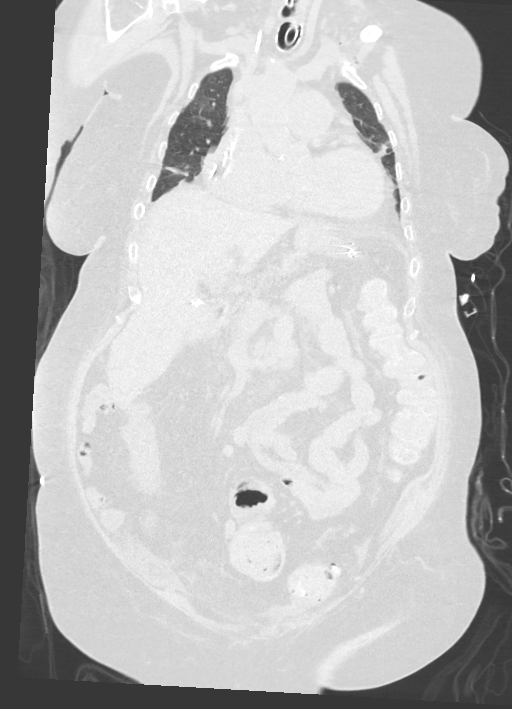
[im 106/177  lung]
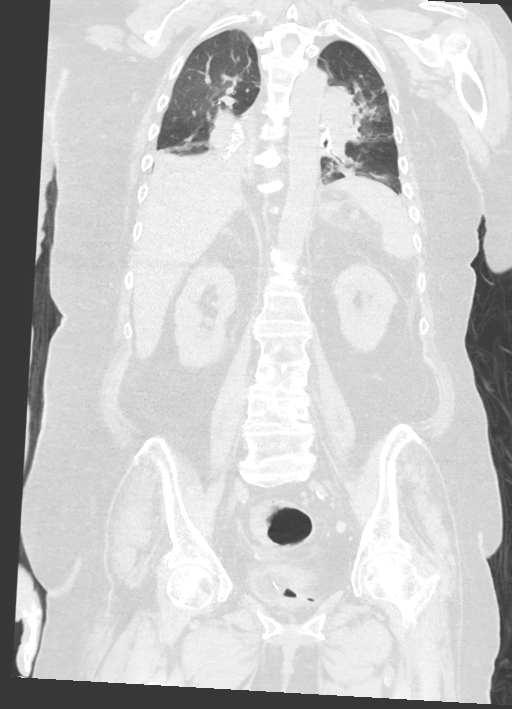

[13 of 36 positions shown; findings below may reference images not displayed]

FINDINGS: CT CHEST FINDINGS

Cardiovascular: A right internal jugular venous catheter is in
place. There is moderate severity calcification of the aortic arch.
There is moderate severity cardiomegaly. No pericardial effusion.

Mediastinum/Nodes: No enlarged mediastinal, hilar, or axillary lymph
nodes. Thyroid gland, trachea, and esophagus demonstrate no
significant findings.

Lungs/Pleura: Endotracheal and nasogastric tubes are in place.
Moderate severity left upper lobe and bilateral lower lobe
atelectasis and/or infiltrate is noted.

Small bilateral pleural effusions are noted, right greater than
left.

No pneumothorax is identified.

Musculoskeletal: There is evidence of prior vertebroplasty at the
level of T12. A chronic fracture deformity of the sternum is seen.

Multilevel degenerative changes are noted throughout the thoracic
spine.

CT ABDOMEN PELVIS FINDINGS

Hepatobiliary: No focal liver abnormality is seen. Status post
cholecystectomy. No biliary dilatation.

Pancreas: Unremarkable. No pancreatic ductal dilatation or
surrounding inflammatory changes.

Spleen: Normal in size without focal abnormality.

Adrenals/Urinary Tract: Adrenal glands are unremarkable. Kidneys are
normal, without renal calculi, focal lesion, or hydronephrosis. A
Foley catheter is seen within the urinary bladder.

Stomach/Bowel: A nasogastric tube is seen with its distal tip noted
within the proximal duodenum. The stomach is otherwise within normal
limits. The appendix is not clearly visualized. No evidence of bowel
wall thickening, distention, or inflammatory changes. Noninflamed
diverticula are seen throughout the sigmoid colon.

Vascular/Lymphatic: Aortic atherosclerosis. No enlarged abdominal or
pelvic lymph nodes.

Reproductive: Status post hysterectomy. No adnexal masses.

Other: There is a 2.5 cm x 4.5 cm fat containing umbilical hernia.
No abdominopelvic ascites.

Musculoskeletal: Multilevel degenerative changes seen throughout the
lumbar spine.
IMPRESSION: 1. Moderate severity left upper lobe and bilateral lower lobe
atelectasis and/or infiltrate.
2. Small bilateral pleural effusions, right greater than left.
3. Moderate severity cardiomegaly.
4. Evidence of prior vertebroplasty at the level of T12.
5. Sigmoid diverticulosis.
6. Fat-containing umbilical hernia.
7. Aortic atherosclerosis.

Aortic Atherosclerosis (JRRYL-JEE.E).

## 2023-01-08 IMAGING — CT CT HEAD W/O CM
3 series · 16 of 47 positions shown, 19 images · non-contrast
Comparison: August 15, 2020

CLINICAL DATA: Status post fall, unresponsive.

EXAM:
CT HEAD WITHOUT CONTRAST
TECHNIQUE: Contiguous axial images were obtained from the base of the skull
through the vertex without intravenous contrast.

[Series 2: head w o · axial · 0.43mm/px · z∈[+313,+458]mm · 10 of 35 slices shown, 13 images]
[im 3/35  brain]
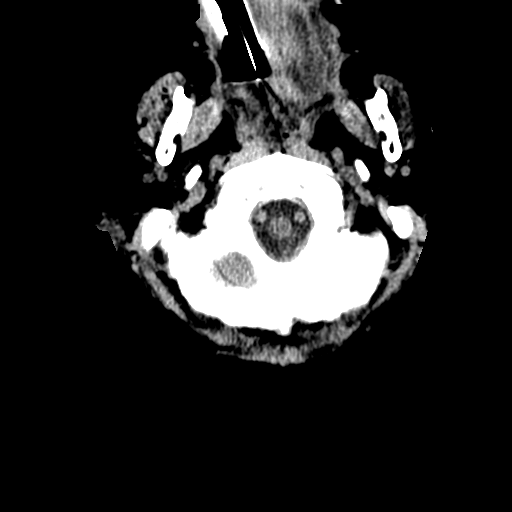
[im 3/35  bone]
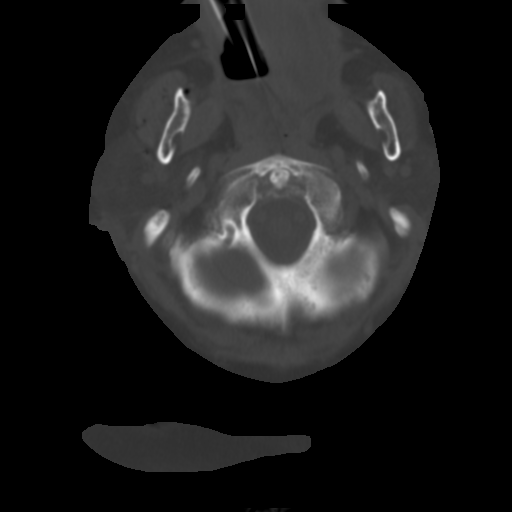
[im 6/35  brain]
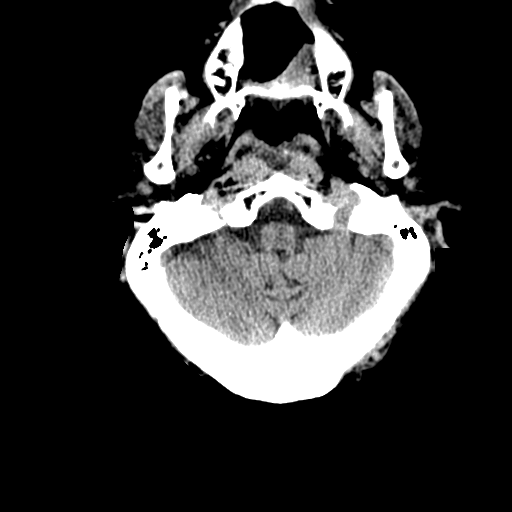
[im 10/35  brain]
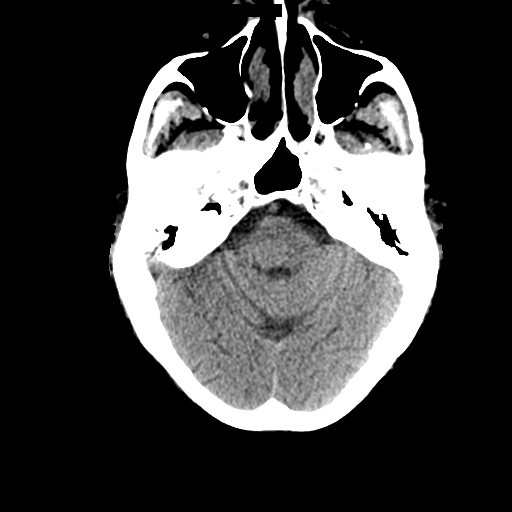
[im 12/35  brain]
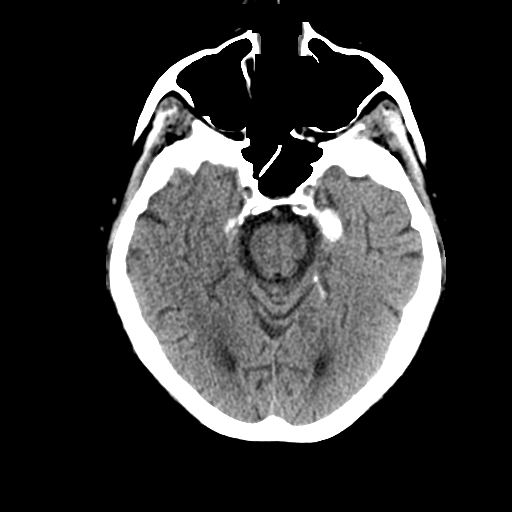
[im 16/35  brain]
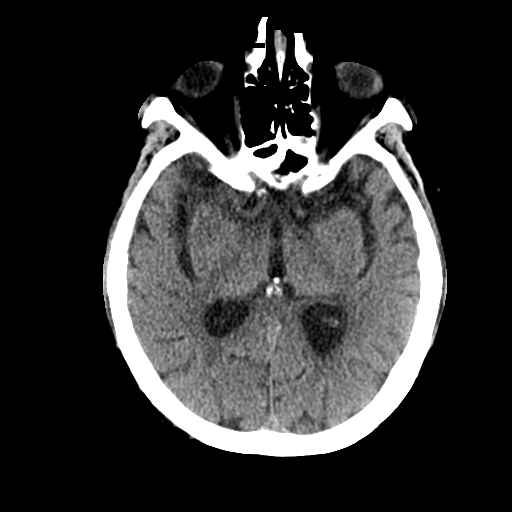
[im 16/35  bone]
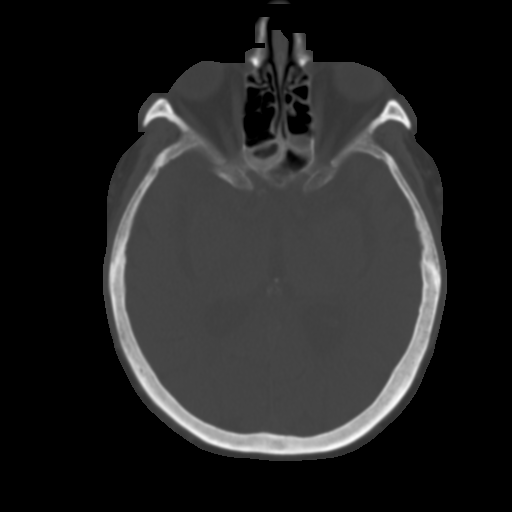
[im 19/35  brain]
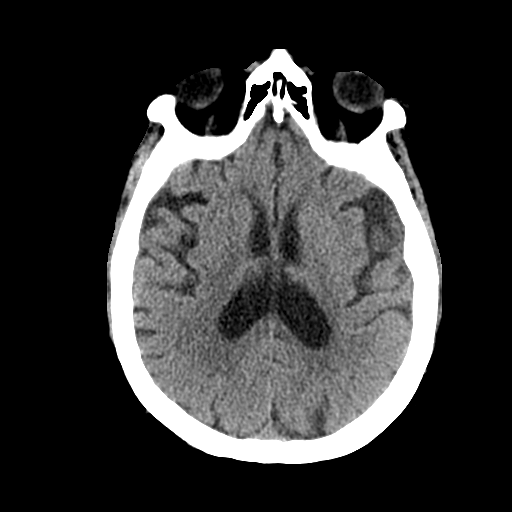
[im 23/35  brain]
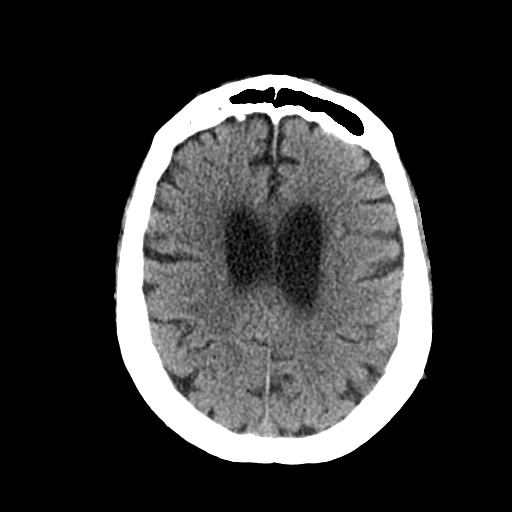
[im 26/35  brain]
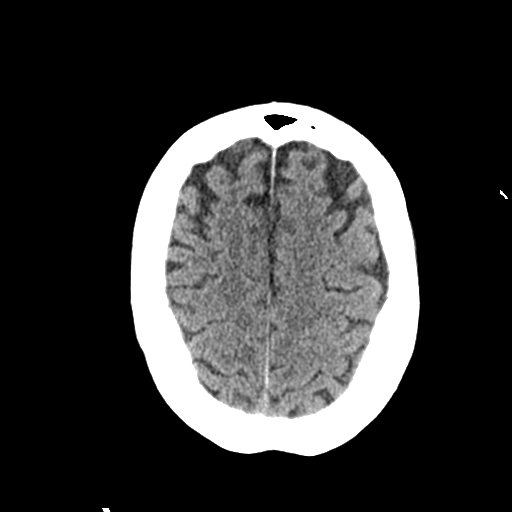
[im 29/35  brain]
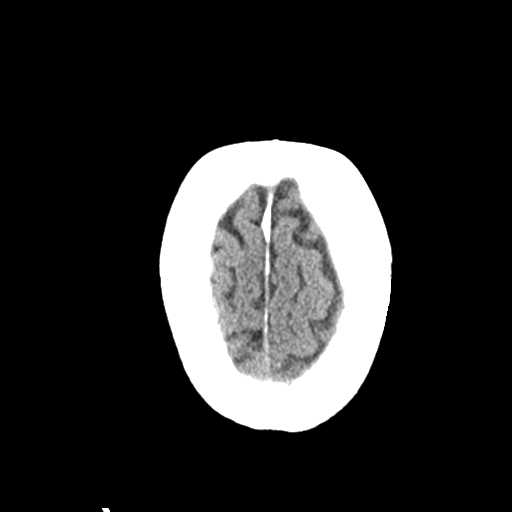
[im 29/35  bone]
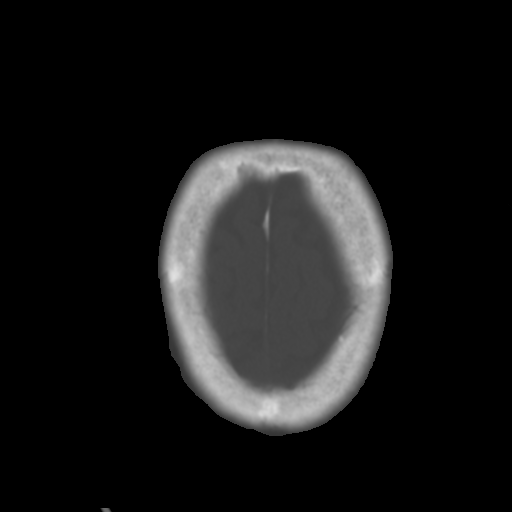
[im 32/35  brain]
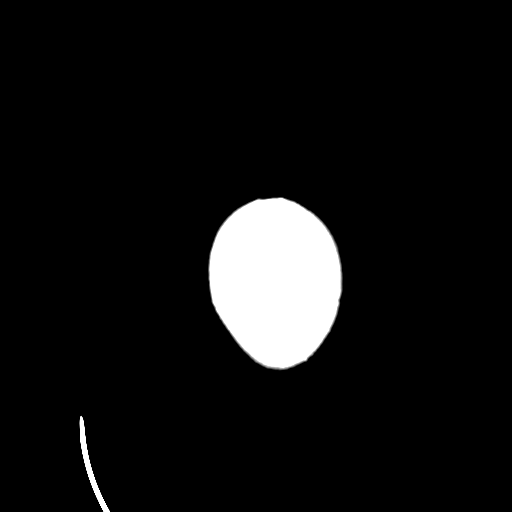

[Series 4: coronal soft · coronal · 0.39mm/px · 3 of 67 slices shown]
[im 26/67  brain]
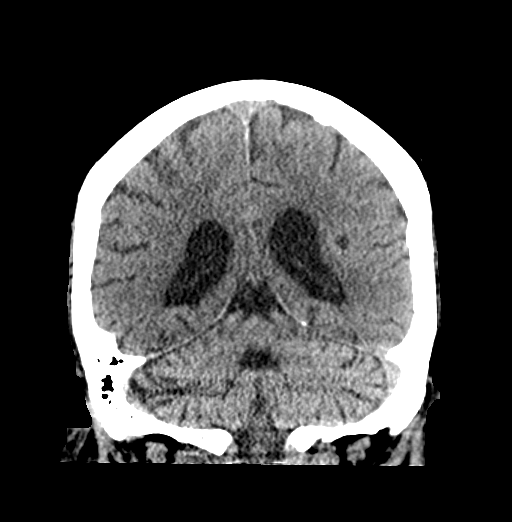
[im 32/67  brain]
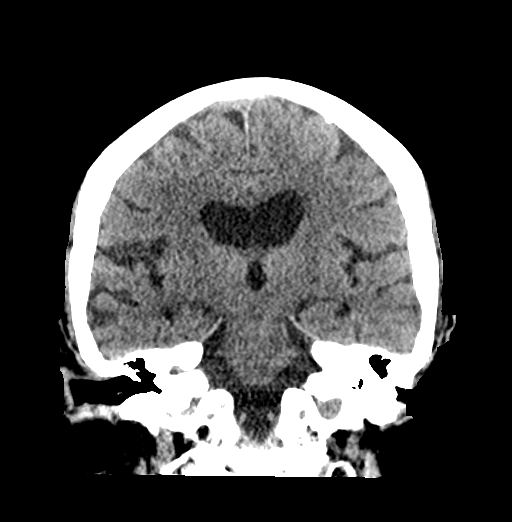
[im 38/67  brain]
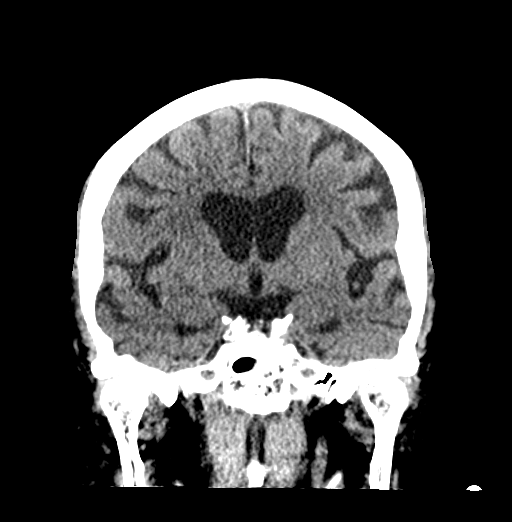

[Series 5: sagittal soft · sagittal · 0.41mm/px · 3 of 59 slices shown]
[im 20/59  brain]
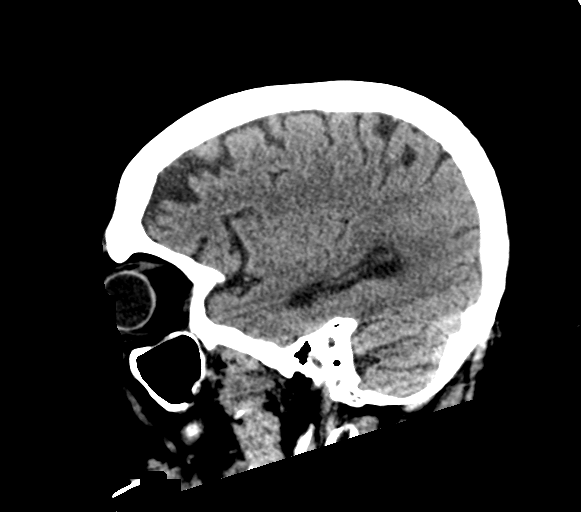
[im 30/59  brain]
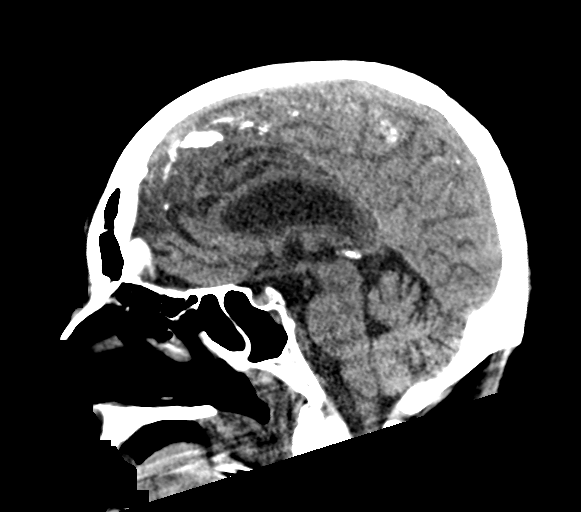
[im 39/59  brain]
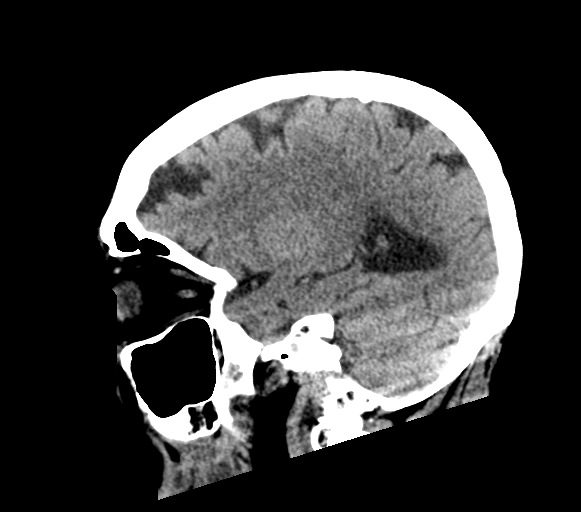

[16 of 47 positions shown; findings below may reference images not displayed]

FINDINGS: Brain: There is mild cerebral atrophy with widening of the
extra-axial spaces and ventricular dilatation.
There are areas of decreased attenuation within the white matter
tracts of the supratentorial brain, consistent with microvascular
disease changes.

Vascular: No hyperdense vessel or unexpected calcification.

Skull: Normal. Negative for fracture or focal lesion.

Sinuses/Orbits: No acute finding.

Other: Endotracheal and orogastric tubes are in place.
IMPRESSION: No acute intracranial pathology.

## 2023-01-08 IMAGING — CT CT CHEST W/O CM
2 of 4 series · 13 of 36 positions shown, 16 images · non-contrast
Comparison: None.

CLINICAL DATA: Status post fall, unresponsive.

EXAM:
CT CHEST, ABDOMEN AND PELVIS WITHOUT CONTRAST
TECHNIQUE: Multidetector CT imaging of the chest, abdomen and pelvis was
performed following the standard protocol without IV contrast.

[Series 2: cap without · axial · non-contrast · 0.93mm/px · z∈[-402,+138]mm · 10 of 132 slices shown, 13 images]
[im 12/132  mediastinal]
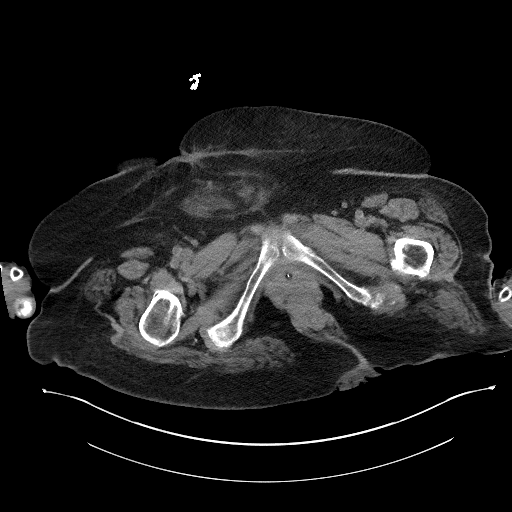
[im 12/132  lung]
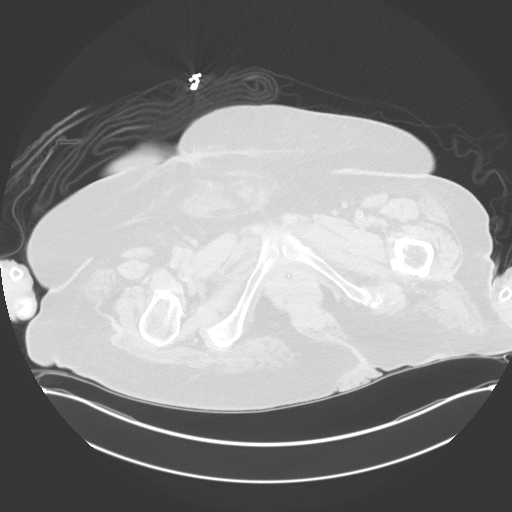
[im 24/132  lung]
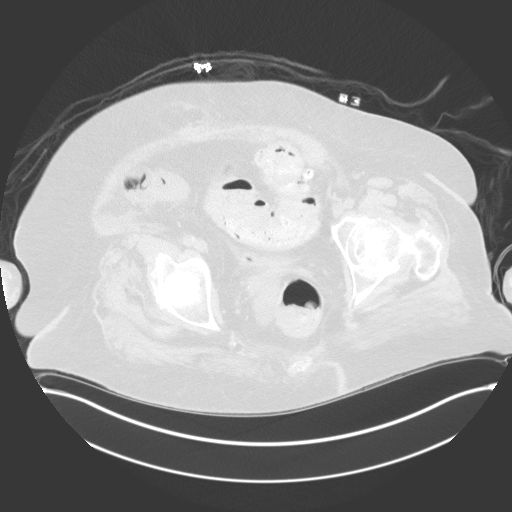
[im 36/132  lung]
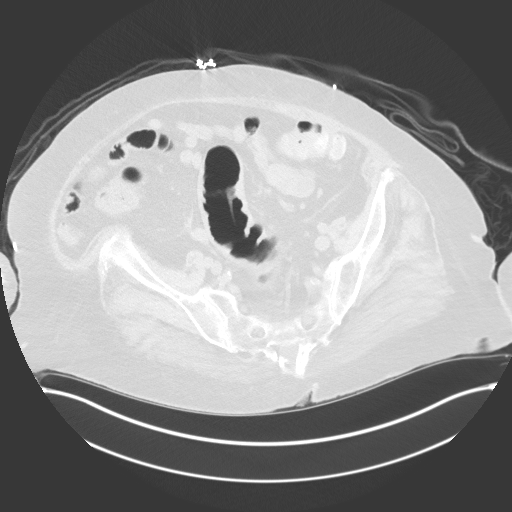
[im 48/132  lung]
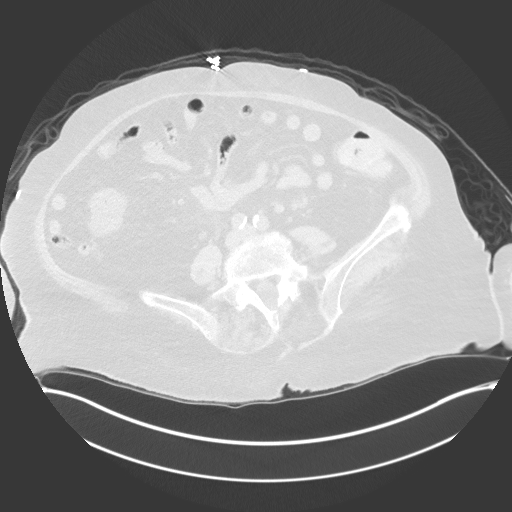
[im 60/132  mediastinal]
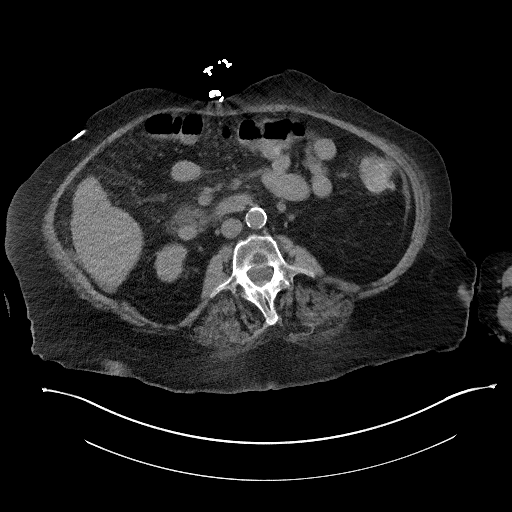
[im 60/132  lung]
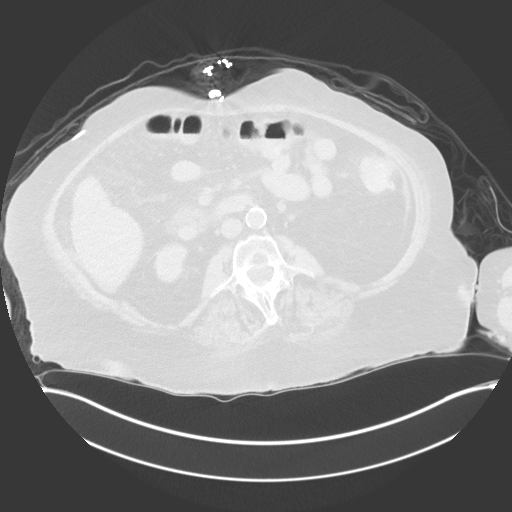
[im 72/132  lung]
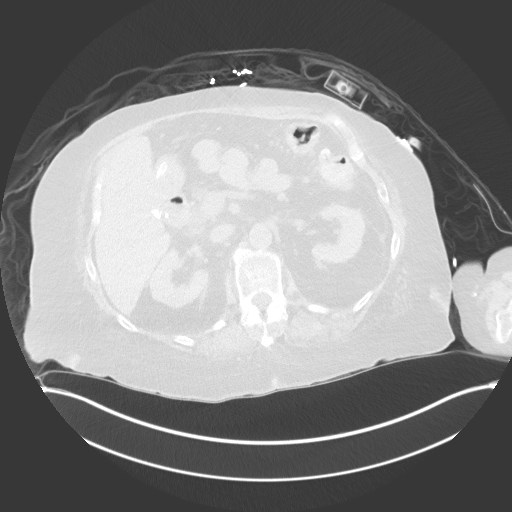
[im 84/132  lung]
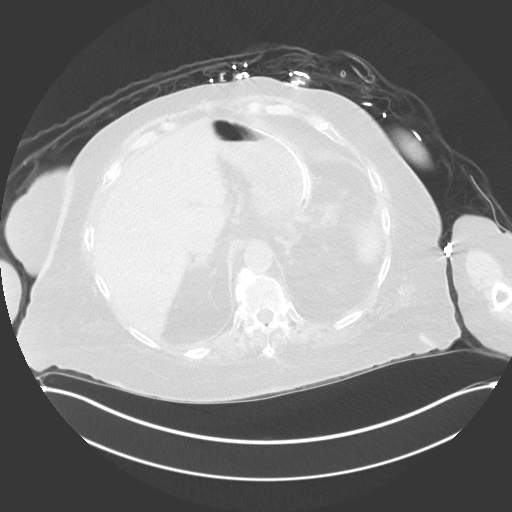
[im 96/132  lung]
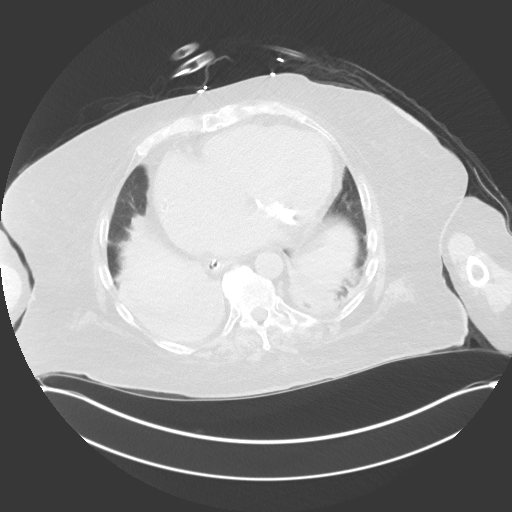
[im 108/132  mediastinal]
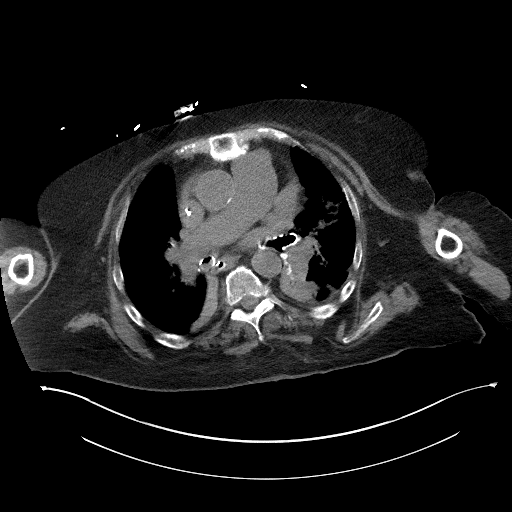
[im 108/132  lung]
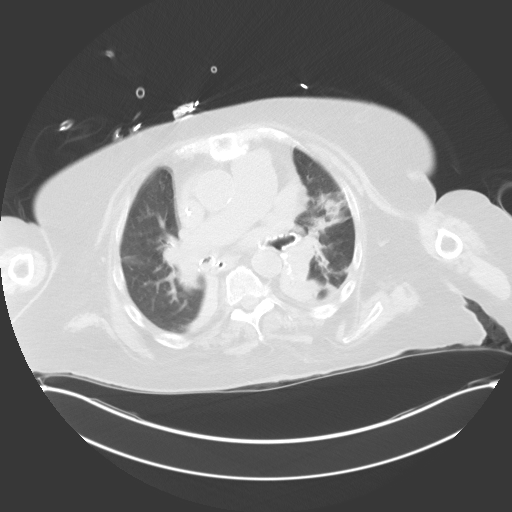
[im 120/132  lung]
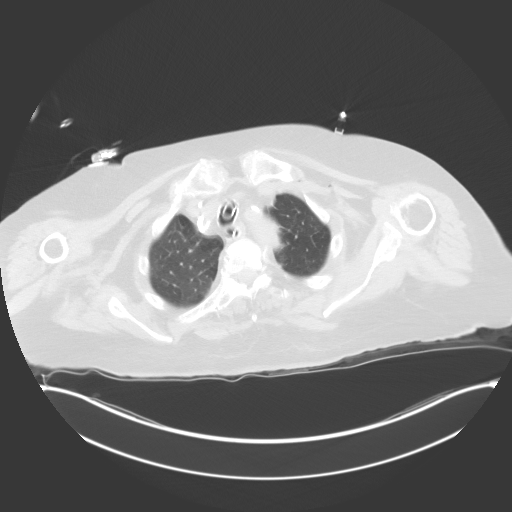

[Series 5: coronal · coronal · 0.94mm/px · 3 of 177 slices shown]
[im 36/177  lung]
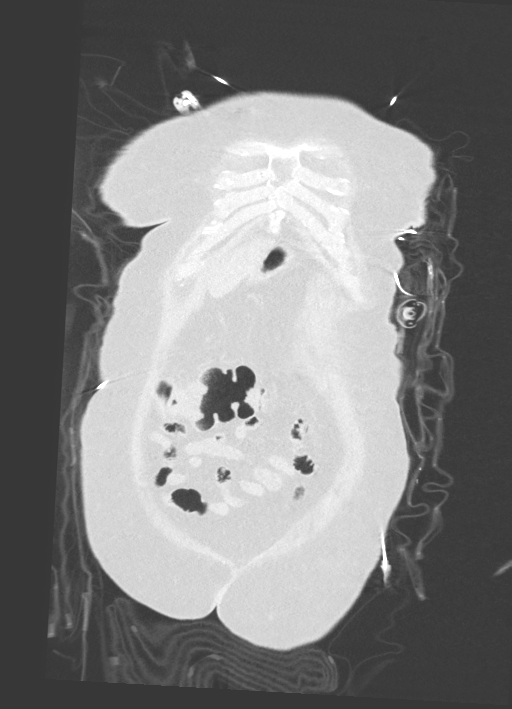
[im 71/177  lung]
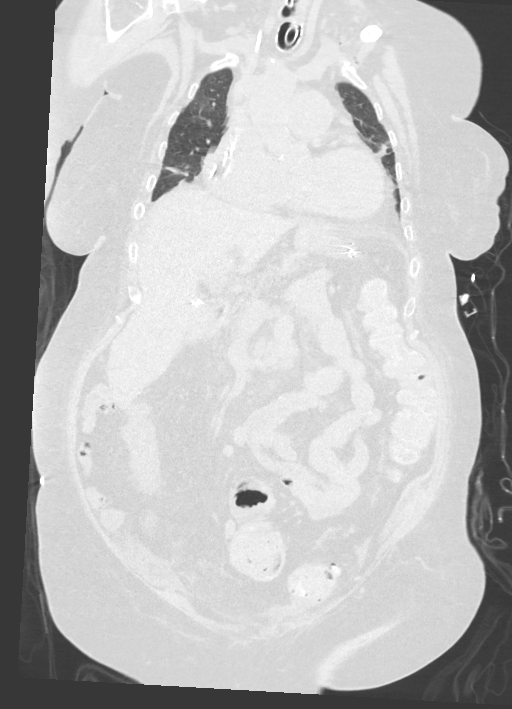
[im 106/177  lung]
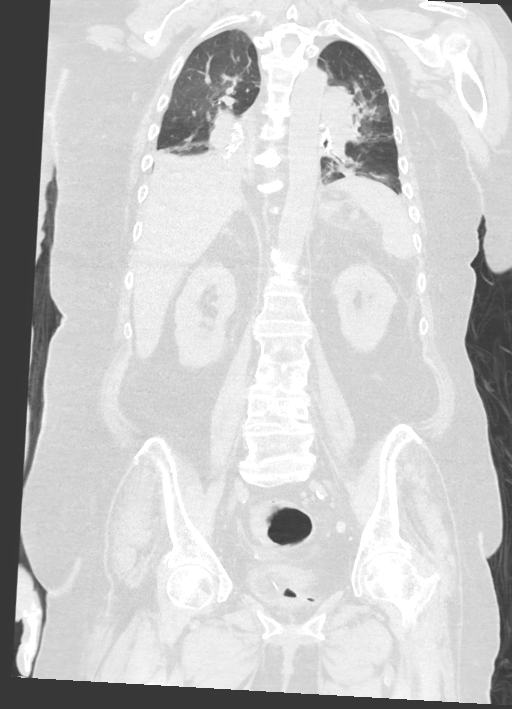

[13 of 36 positions shown; findings below may reference images not displayed]

FINDINGS: CT CHEST FINDINGS

Cardiovascular: A right internal jugular venous catheter is in
place. There is moderate severity calcification of the aortic arch.
There is moderate severity cardiomegaly. No pericardial effusion.

Mediastinum/Nodes: No enlarged mediastinal, hilar, or axillary lymph
nodes. Thyroid gland, trachea, and esophagus demonstrate no
significant findings.

Lungs/Pleura: Endotracheal and nasogastric tubes are in place.
Moderate severity left upper lobe and bilateral lower lobe
atelectasis and/or infiltrate is noted.

Small bilateral pleural effusions are noted, right greater than
left.

No pneumothorax is identified.

Musculoskeletal: There is evidence of prior vertebroplasty at the
level of T12. A chronic fracture deformity of the sternum is seen.

Multilevel degenerative changes are noted throughout the thoracic
spine.

CT ABDOMEN PELVIS FINDINGS

Hepatobiliary: No focal liver abnormality is seen. Status post
cholecystectomy. No biliary dilatation.

Pancreas: Unremarkable. No pancreatic ductal dilatation or
surrounding inflammatory changes.

Spleen: Normal in size without focal abnormality.

Adrenals/Urinary Tract: Adrenal glands are unremarkable. Kidneys are
normal, without renal calculi, focal lesion, or hydronephrosis. A
Foley catheter is seen within the urinary bladder.

Stomach/Bowel: A nasogastric tube is seen with its distal tip noted
within the proximal duodenum. The stomach is otherwise within normal
limits. The appendix is not clearly visualized. No evidence of bowel
wall thickening, distention, or inflammatory changes. Noninflamed
diverticula are seen throughout the sigmoid colon.

Vascular/Lymphatic: Aortic atherosclerosis. No enlarged abdominal or
pelvic lymph nodes.

Reproductive: Status post hysterectomy. No adnexal masses.

Other: There is a 2.5 cm x 4.5 cm fat containing umbilical hernia.
No abdominopelvic ascites.

Musculoskeletal: Multilevel degenerative changes seen throughout the
lumbar spine.
IMPRESSION: 1. Moderate severity left upper lobe and bilateral lower lobe
atelectasis and/or infiltrate.
2. Small bilateral pleural effusions, right greater than left.
3. Moderate severity cardiomegaly.
4. Evidence of prior vertebroplasty at the level of T12.
5. Sigmoid diverticulosis.
6. Fat-containing umbilical hernia.
7. Aortic atherosclerosis.

Aortic Atherosclerosis (JRRYL-JEE.[email or web address]).

## 2023-01-08 IMAGING — CT CT CERVICAL SPINE W/O CM
4 series · 14 of 33 positions shown, 16 images · non-contrast
Comparison: October 04, 2012

CLINICAL DATA: Status post fall, unresponsive.

EXAM:
CT CERVICAL SPINE WITHOUT CONTRAST
TECHNIQUE: Multidetector CT imaging of the cervical spine was performed without
intravenous contrast. Multiplanar CT image reconstructions were also
generated.

[Series 4: c spine soft · axial · 0.38mm/px · z∈[+169,+199]mm · 2 of 105 slices shown]
[im 15/105  soft-tissue]
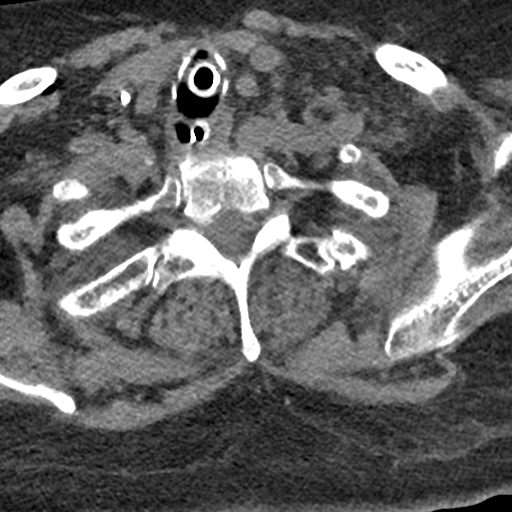
[im 30/105  soft-tissue]
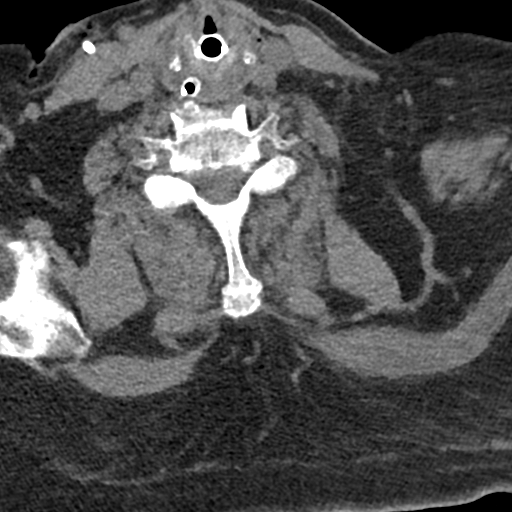

[Series 5: sagittal bone · sagittal · 0.32mm/px · 5 of 68 slices shown, 6 images]
[im 23/68  bone]
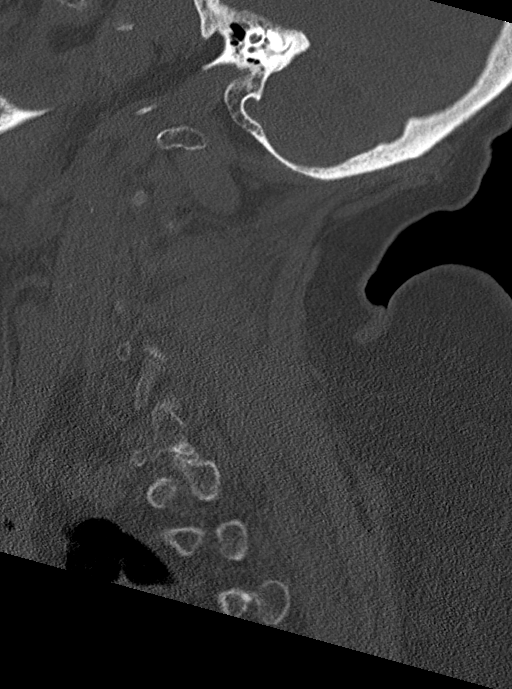
[im 28/68  bone]
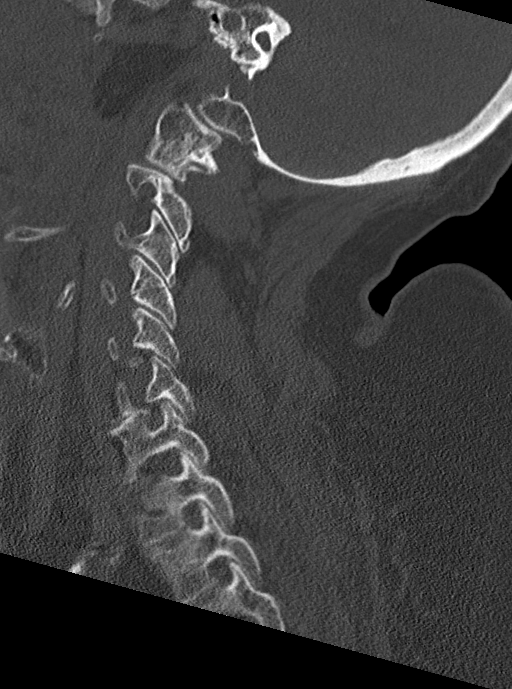
[im 34/68  soft-tissue]
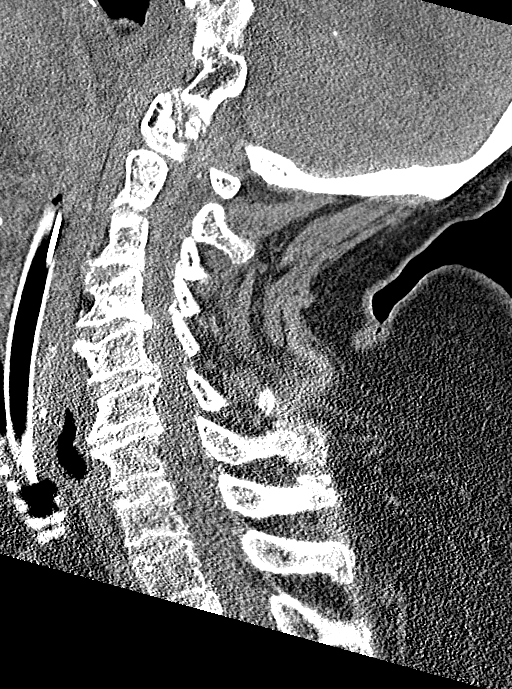
[im 34/68  bone]
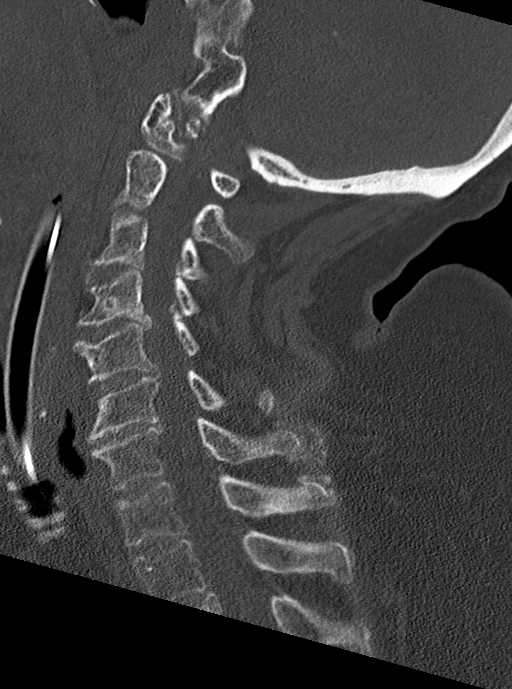
[im 40/68  bone]
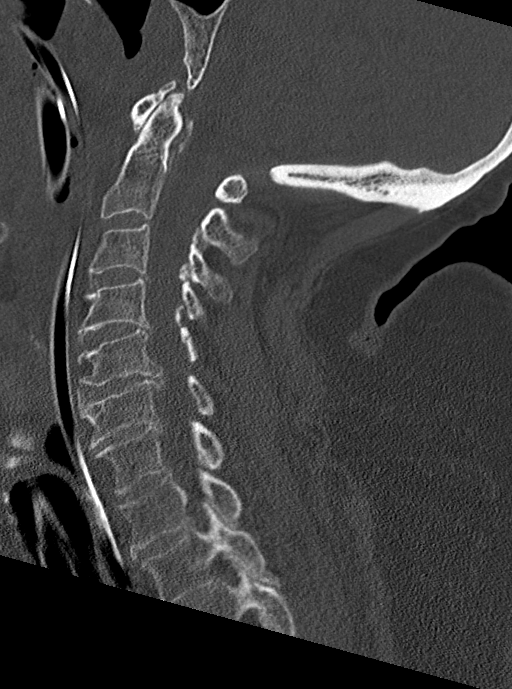
[im 45/68  bone]
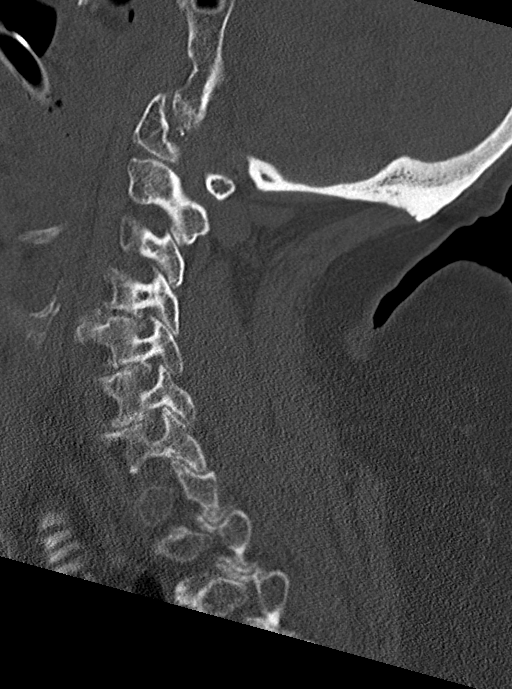

[Series 6: coronal bone · coronal · 0.30mm/px · 3 of 67 slices shown]
[im 14/67  bone]
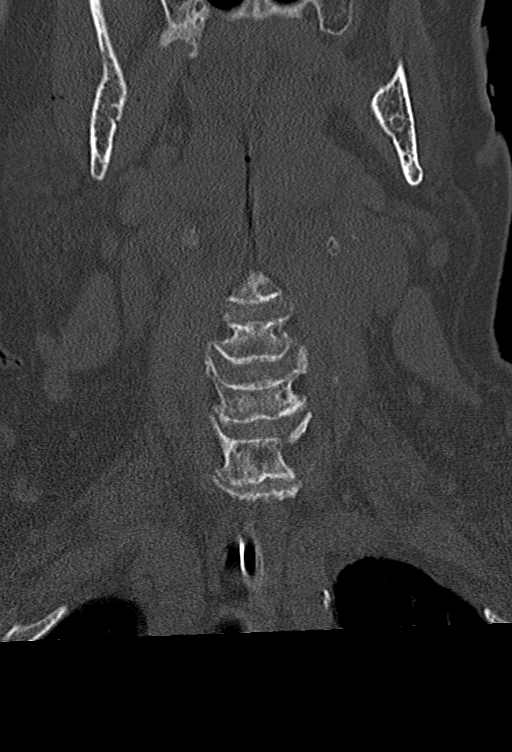
[im 27/67  bone]
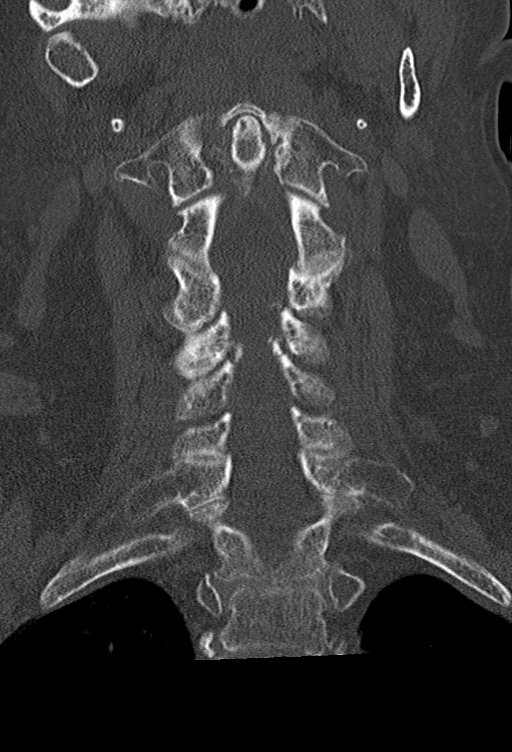
[im 40/67  bone]
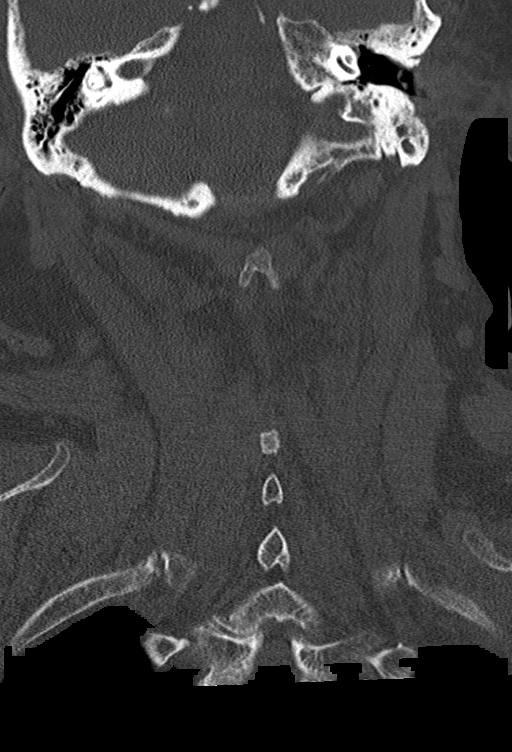

[Series 7: orthogonal axials · axial · 0.21mm/px · z∈[+155,+271]mm · 4 of 91 slices shown, 5 images]
[im 19/91  soft-tissue]
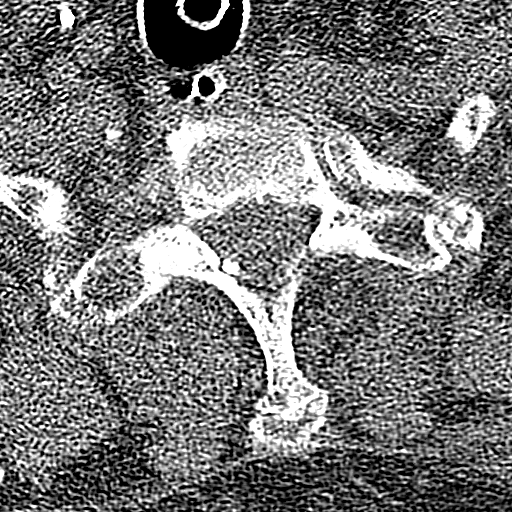
[im 19/91  bone]
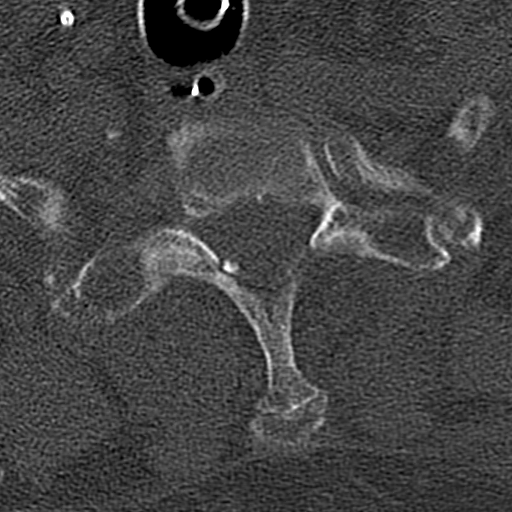
[im 37/91  bone]
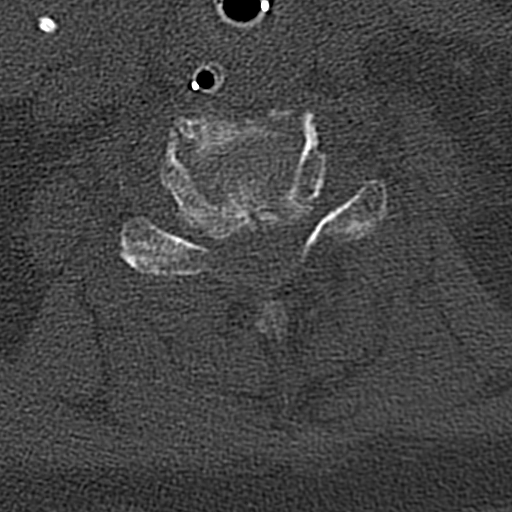
[im 55/91  bone]
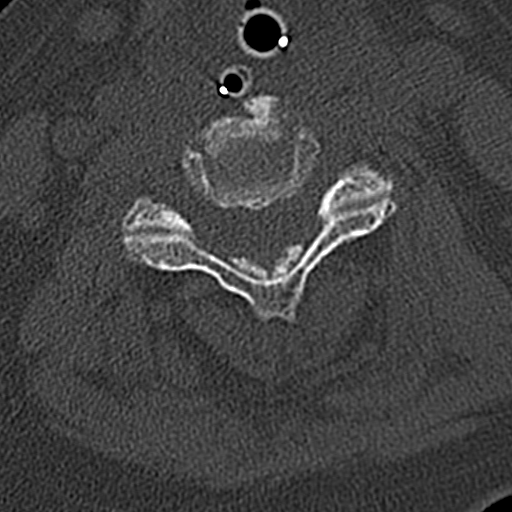
[im 73/91  bone]
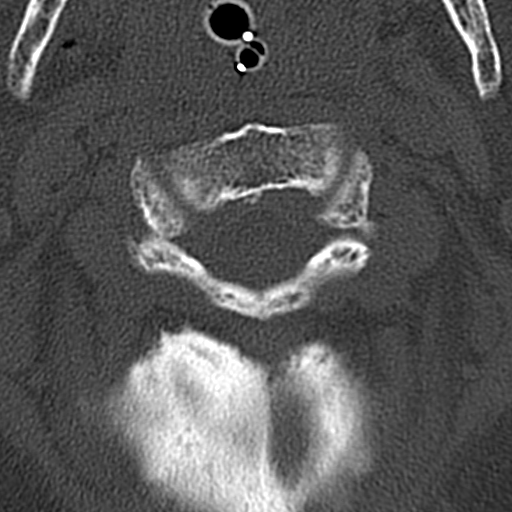

[14 of 33 positions shown; findings below may reference images not displayed]

FINDINGS: Alignment: Normal.

Skull base and vertebrae: No acute fracture. No primary bone lesion
or focal pathologic process.

Soft tissues and spinal canal: No prevertebral fluid or swelling. No
visible canal hematoma.

Disc levels: Marked severity endplate sclerosis and osteophyte
formation is seen at the levels of C3-C4, C4-C5, C5-C6 and C6-C7.

There is marked severity narrowing of the anterior atlantoaxial
articulation. Mild to moderate severity intervertebral disc space
narrowing is seen at the levels of C5-C6 and C6-C7.

Moderate severity bilateral multilevel facet joint hypertrophy is
noted.

Upper chest: Negative.

Other: Endotracheal and orogastric tubes are noted.
IMPRESSION: 1. No acute osseous abnormality.
2. Marked severity multilevel degenerative changes, most prominent
at the levels of C3-C4, C4-C5, C5-C6 and C6-C7.

## 2023-01-08 IMAGING — DX DG CHEST 1V PORT
2 series · 2 of 2 positions shown · non-contrast
Comparison: August 17, 2020

CLINICAL DATA: Status post intubation.

EXAM:
PORTABLE CHEST 1 VIEW

[chest ap (1 of 2)]
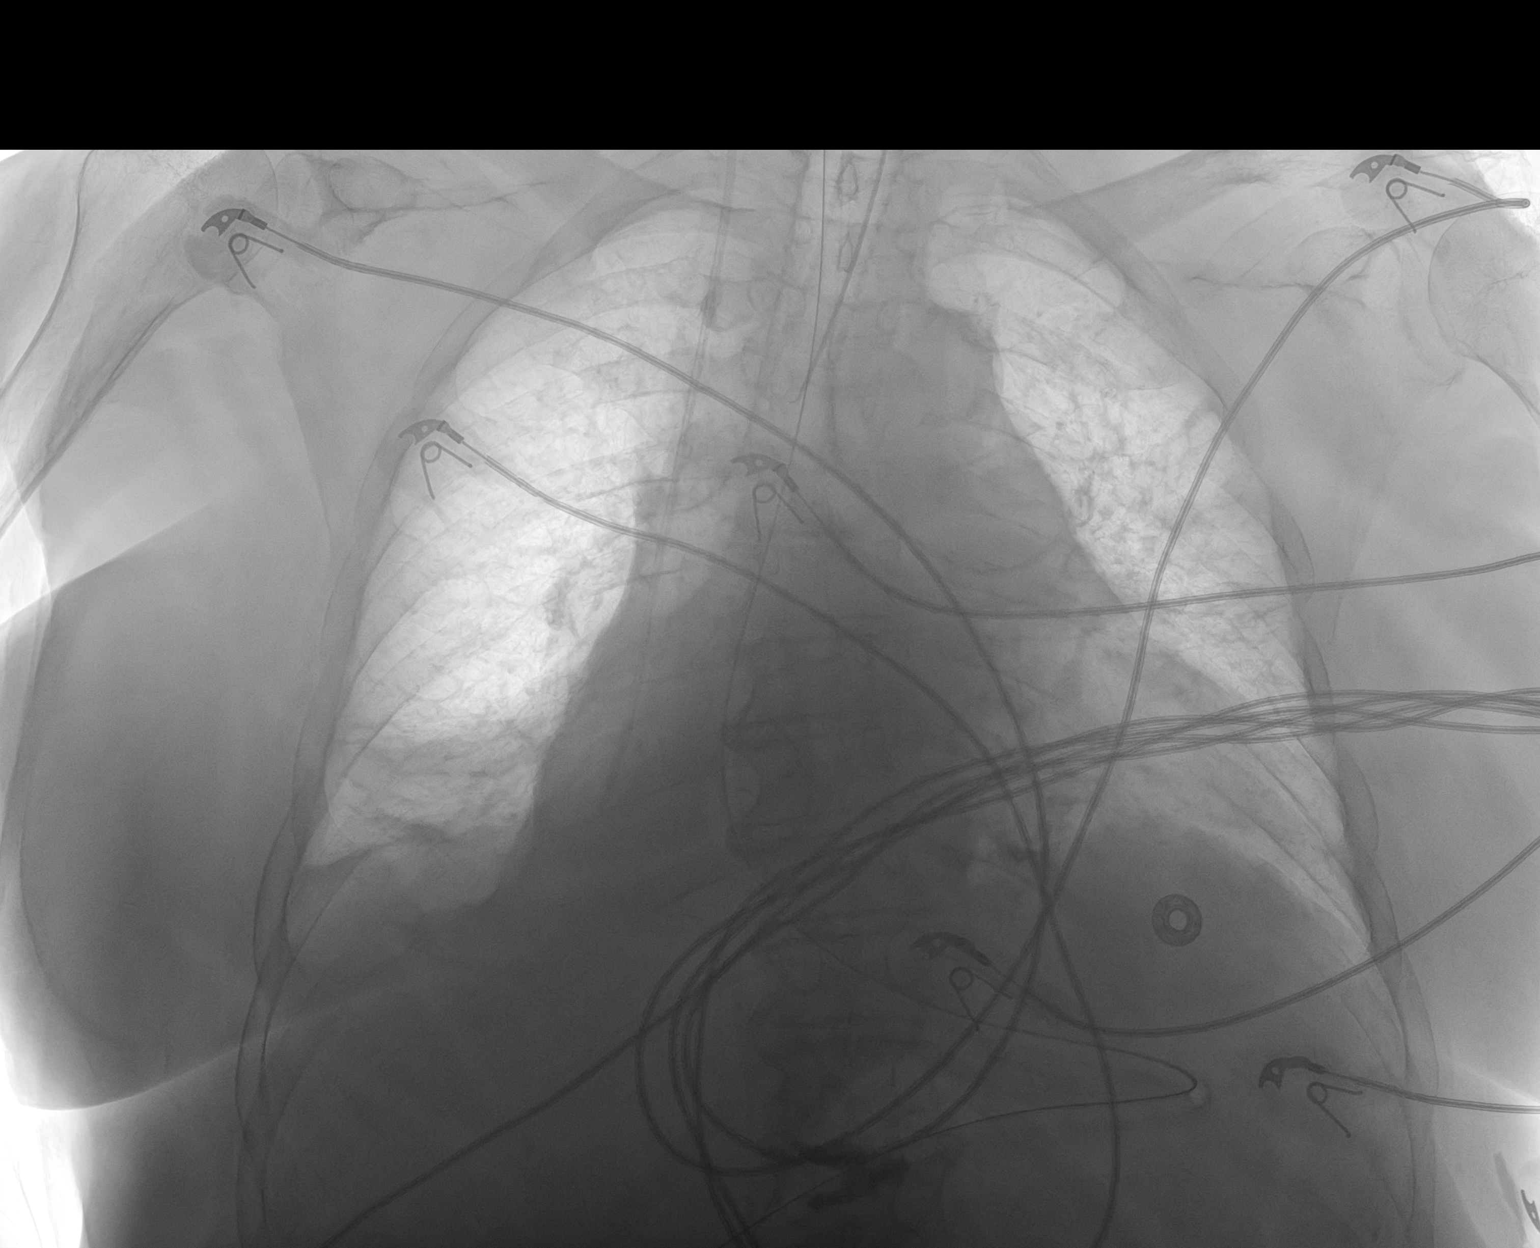

[chest ap (2 of 2)]
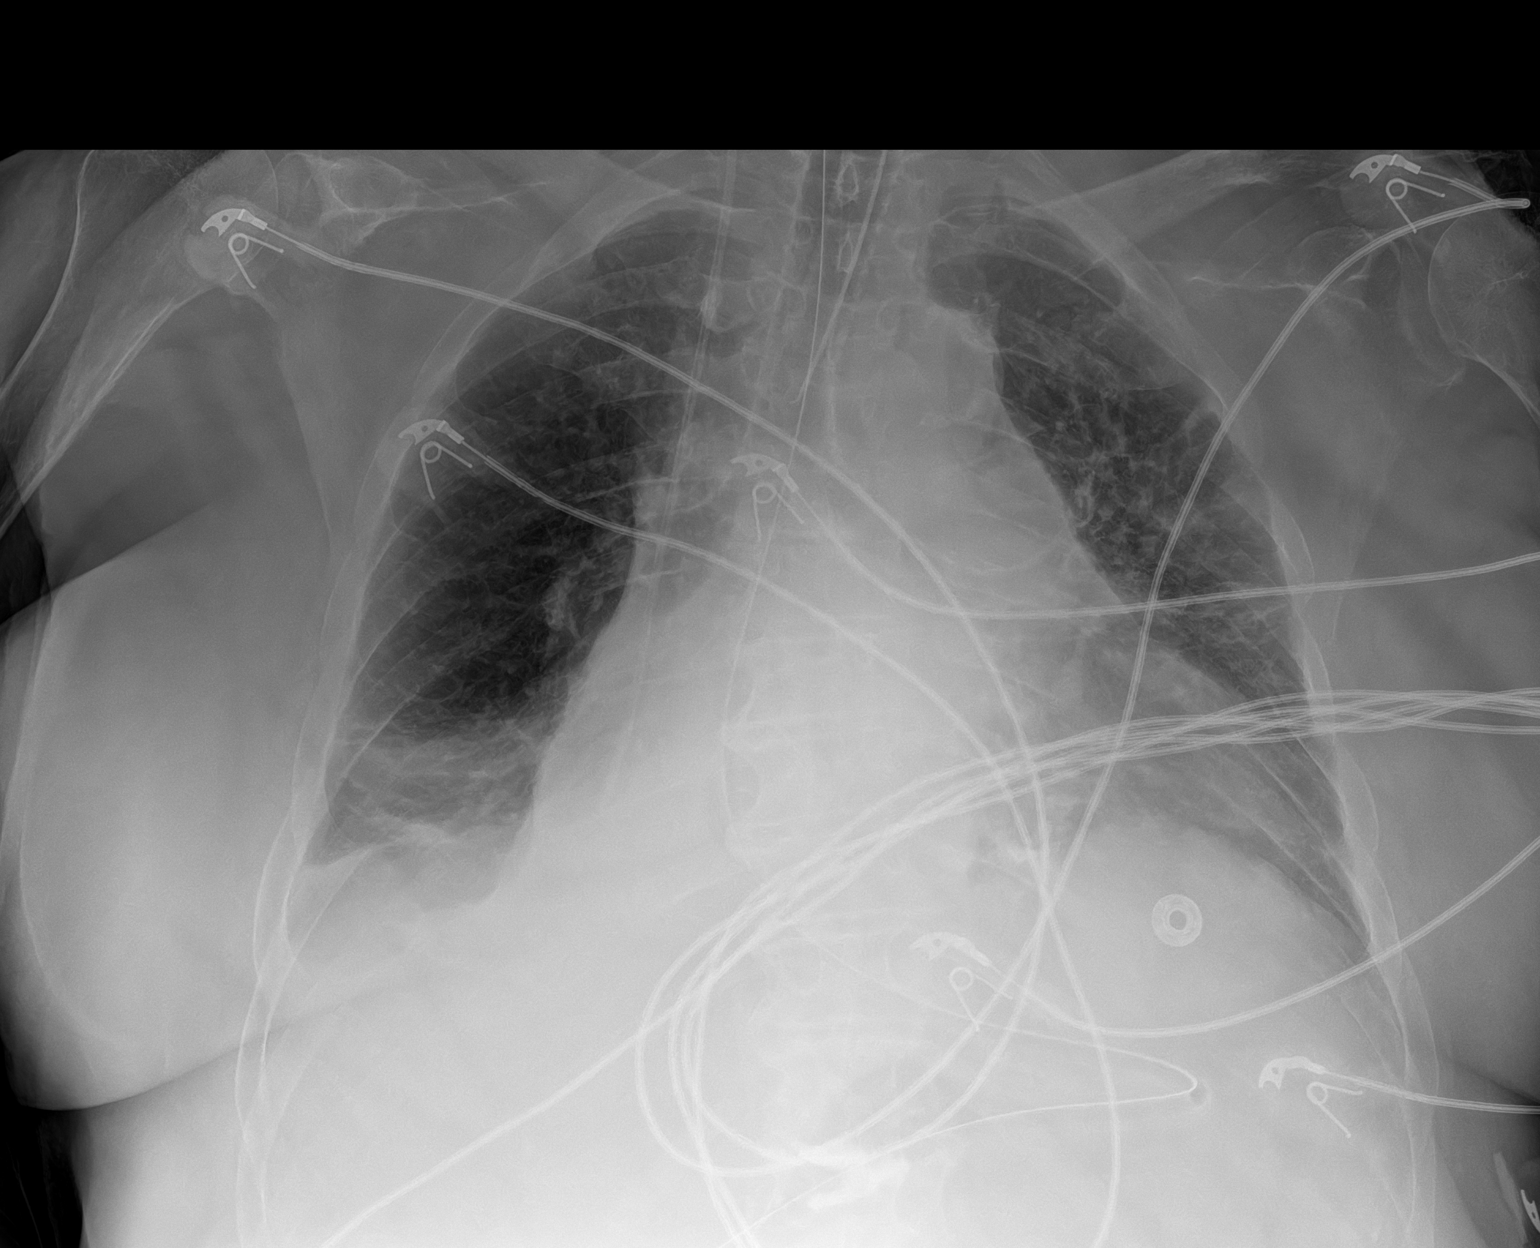

[2 of 2 positions shown; findings below may reference images not displayed]

FINDINGS: An endotracheal tube is seen with its distal tip approximately
cm from the carina. A nasogastric tube is noted with its distal end
seen within the expected region of the gastric antrum. There is a
right internal jugular venous catheter. Its distal tip is seen
within the right atrium, approximately 5.2 cm distal to the junction
of the superior vena cava and right atrium. Mild atelectasis and/or
infiltrate is noted within the right lung base. There is no evidence
of a pleural effusion or pneumothorax. The cardiac silhouette is
markedly enlarged. The visualized skeletal structures are
unremarkable.
IMPRESSION: 1. Interval endotracheal tube, nasogastric tube and right internal
jugular venous catheter placement and positioning, as described
above.
2. Mild right basilar atelectasis and/or infiltrate.

## 2023-01-09 ENCOUNTER — Other Ambulatory Visit: Payer: Self-pay | Admitting: Family Medicine

## 2023-01-09 MED ORDER — EMPAGLIFLOZIN 10 MG PO TABS: 10.0000 mg | ORAL_TABLET | Freq: Every day | ORAL | 3 refills | Status: DC

## 2023-01-09 MED ORDER — LEVOTHYROXINE SODIUM 200 MCG PO TABS: 200.0000 ug | ORAL_TABLET | Freq: Every day | ORAL | 1 refills | Status: DC

## 2023-01-09 MED ORDER — VITAMIN D3 50 MCG (2000 UT) PO CAPS: 2000.0000 [IU] | ORAL_CAPSULE | Freq: Every day | ORAL | 1 refills | Status: DC

## 2023-01-09 NOTE — Progress Notes (Deleted)
Referring Provider: Gareth Morgan, MD Primary Care Physician:  Rica Records, FNP Primary GI Physician: Dr. Marletta Lor   No chief complaint on file.   HPI:   [name] is a 72 y.o. female with history of CHF, restrictive lung disease, hospitalized in June 2022 with respiratory failure and required tracheostomy, IDA previously following with hematology and receiving IV iron due to oral iron intolerance,  IBS with diarrhea, chronic lower abdominal pain worsened postprandially with negative mesenteric [place] in 2021, gastroparesis, GERD, presenting today for  3 month follow-up of IBS  Last seen in the office 10/05/2022.  Reported alternating constipation and diarrhea.  She was having 1 or 2 bowel movements a day or skipping a day between bowel movements, occasional hard stools with straining.  Also noted some stool seepage at times.  She was taking dicyclomine 10 mg 3 times daily and using loperamide as needed, typically 1/day, but may take a second dose.  Small amount of rectal bleeding with an episode of constipation a few weeks ago with no recurrent symptoms.  Reported hemorrhoids.  No rectal pain.  Preferred to hold off on colonoscopy.  She was requesting to go back to see hematology due to her history of anemia.  Her hemoglobin was stable in the 9 range.  GERD well-controlled on pantoprazole 40 mg twice daily.  Recommended abdominal x-ray to evaluate stool burden, add Benefiber daily.  Otherwise, continue dicyclomine 3 times daily and Imodium as needed.  Also prescribed Anusol rectal cream for possible hemorrhoids.  If recurrent bleeding, recommended updating colonoscopy.  Abdominal x-ray showed mild fecal loading in the descending colon.  Results were discussed with patient's daughter who stated patient's bowel function was improving with Metamucil.  Recommended continuing this and following up in 3 months.  She did reestablish with hematology.  Her hemoglobin was stable at 10.1, but  found to have very low normal iron panel on 10/11/2022.  She received IV iron 11/02/2022.  On 12/23/2022, hemoglobin improved to 10.9, and iron panel improved as well.  Today:   IBS with alternating constipation and diarrhea:      Prior endoscopic evaluation:  EGD in May 2016 due to anemia and history of Barrett's without evidence of Barrett's. Last colonoscopy in May 2015 with redundant colon, moderate diverticulosis throughout the entire colon.  Capsule in April 2015 with idiopathic small bowel ulcers that have been present since 2013.    Past Medical History:  Diagnosis Date   Abnormal pulmonary function test    Anemia    H/H of 10/30 with a normal MCV in 12/09   Anxiety    Arthritis    Barrett's esophagus    Diagnosed 1995. Last EGD 2016-NO BARRETT'S.    Chest pain    a. 2022 Cath: nl cors; b. 2008 Neg myoview; c. 01/2014 Cath: Nl cors. EF 40%; c. 08/2020 NSTEMI/Cath [facility]): nl cors.   Chronic anticoagulation    Chronic combined systolic and diastolic CHF (congestive heart failure) (HCC)    a. 10/2014 Echo: EF40%; b. 01/2015 Echo: EF 55-60%; c. 11/2020 Echo: EF 55-60%, mod LVH, sev BAE, mild MR, mod TR. PASP ; d. 01/2021 Echo: EF 45-50%, glob HK, mod conc LVH, mod reduced RV fxn, RVSP 60.44mmHg, Mod TR, mild to mod MR, sev dil LA.   Chronic LBP    Surgical intervention in 1996   Diabetes mellitus, type 2 (HCC)    Insulin therapy; exacerbated by prednisone   Gastroparesis    99%  retention 05/2008 on GES   GERD (gastroesophageal reflux disease)    Heart attack (HCC) 08/13/2020   Hiatal hernia    Hyperlipidemia    Hypertension    Hypothyroid    IBS (irritable bowel syndrome)    Obesity    OSA on CPAP    had CPAP and cannot tolerate.   Paroxysmal atrial fibrillation (HCC)    a.CHA2DS2VASc = 6-->eliquis. Also on Amio.   Pulmonary hypertension (HCC) 01/2015   a. 01/2015 RHC: Predominantly pulmonary venous hypertension but may be component of PAH (PA mean 52, PCWP 31); b. 01/2021  Echo: RVSP 60.103mmHg w/ mod reduced RV fxn.   Seizures (HCC)    last seizure was 2 years ago; on keppra for this; unknown etiology   Syncope    a. Admitted 05/2009; magnetic resonance imagin/ MRA - negative; etiology thought to be orthostasis secondary to drugs and dehydration. b. Syncope 02/2015 also felt 2/2 dehydration.    Past Surgical History:  Procedure Laterality Date   BACK SURGERY  1996   BIOPSY N/A 11/08/2013   Procedure: BIOPSY  / Tissue sampling / ulcers present in small intestine;  Surgeon: West Bali, MD;  Location: [facility];  Service: Endoscopy;  Laterality: N/A;   CARDIAC CATHETERIZATION  2002   CARDIAC CATHETERIZATION N/A 01/26/2015   Procedure: Right Heart Cath;  Surgeon: Laurey Morale, MD;  Location: [facility] INVASIVE CV LAB;  Service: Cardiovascular;  Laterality: N/A;   CARDIOVASCULAR STRESS TEST  2008   Stress nuclear study   CARDIOVERSION N/A 03/06/2015   Procedure: CARDIOVERSION;  Surgeon: Laurey Morale, MD;  Location: [facility] ENDOSCOPY;  Service: Cardiovascular;  Laterality: N/A;   CARPAL TUNNEL RELEASE  1994   CATARACT EXTRACTION W/PHACO Right 01/10/2022   Procedure: CATARACT EXTRACTION PHACO AND INTRAOCULAR LENS PLACEMENT (IOC);  Surgeon: [name], MD;  Location: [facility] ORS;  Service: Ophthalmology;  Laterality: Right;  CDE: 21.51   CATARACT EXTRACTION W/PHACO Left 01/24/2022   Procedure: CATARACT EXTRACTION PHACO AND INTRAOCULAR LENS PLACEMENT (IOC);  Surgeon: [name], MD;  Location: [facility] ORS;  Service: Ophthalmology;  Laterality: Left;  CDE: 17.13   COLONOSCOPY  11/2011   Dr. Darrick Penna: Internal hemorrhoids, mild diverticulosis. Random colon biopsies negative.   COLONOSCOPY N/A 11/08/2013   SLF: Normal mucosa in the terminal ileum/The colon IS redundant/  Moderate diverticulosis throughout the entire colon. ileum bx benign. colon bx benign   CRYOTHERAPY  01/23/2021   Procedure: CRYOTHERAPY;  Surgeon: Lorin Glass, MD;  Location: [facility] ENDOSCOPY;  Service:  Pulmonary;;   CRYOTHERAPY  01/24/2021   Procedure: Cloyde Reams;  Surgeon: Lorin Glass, MD;  Location: [facility] ENDOSCOPY;  Service: Pulmonary;;   CRYOTHERAPY  01/26/2021   Procedure: Cloyde Reams;  Surgeon: Lupita Leash, MD;  Location: [facility] ENDOSCOPY;  Service: Cardiopulmonary;;   ESOPHAGOGASTRODUODENOSCOPY  2008   Barrett's without dysplasia. esphagus dilated. antral erosions, h.pylori serologies negative.   ESOPHAGOGASTRODUODENOSCOPY  11/2011   Dr. Darrick Penna: Barrett's esophagus, mild gastritis, diverticulum in the second portion of the duodenum repeat EGD 3 years. Small bowel biopsies negative. Gastric biopsy show reactive gastropathy but no H. pylori. Esophageal biopsies consistent with GERD. Next EGD 11/2014   ESOPHAGOGASTRODUODENOSCOPY N/A 11/21/2014   UEA:VWUJ non-erosive gastritis/irregular z-line   ESOPHAGOGASTRODUODENOSCOPY  03/2022   FOREIGN BODY REMOVAL  01/23/2021   Procedure: FOREIGN BODY REMOVAL;  Surgeon: Lorin Glass, MD;  Location: [facility] ENDOSCOPY;  Service: Pulmonary;;   FOREIGN BODY REMOVAL  01/24/2021   Procedure: FOREIGN BODY REMOVAL;  Surgeon: Katrinka Blazing,  Vinnie Level, MD;  Location: [facility] ENDOSCOPY;  Service: Pulmonary;;   GIVENS CAPSULE STUDY  12/07/2011   Proximal small bowel, rare AVM. Distal small bowel, multiple ulcers noted   GIVENS CAPSULE STUDY N/A 09/27/2013   Distal small bowel ulcers extending to TI.   GIVENS CAPSULE STUDY N/A 10/10/2013   Procedure: GIVENS CAPSULE STUDY;  Surgeon: West Bali, MD;  Location: [facility];  Service: Endoscopy;  Laterality: N/A;  7:30   HEMOSTASIS CONTROL  01/24/2021   Procedure: HEMOSTASIS CONTROL;  Surgeon: Lorin Glass, MD;  Location: [facility] ENDOSCOPY;  Service: Pulmonary;;   HEMOSTASIS CONTROL  01/26/2021   Procedure: HEMOSTASIS CONTROL;  Surgeon: Lupita Leash, MD;  Location: [facility] ENDOSCOPY;  Service: Cardiopulmonary;;   IR GASTROSTOMY TUBE MOD SED  01/11/2021   IR GASTROSTOMY TUBE REMOVAL  04/13/2021   IR KYPHO THORACIC WITH  BONE BIOPSY  02/09/2018   KNEE ARTHROSCOPY WITH MEDIAL MENISECTOMY Right 06/09/2016   Procedure: KNEE ARTHROSCOPY WITH MEDIAL MENISECTOMY;  Surgeon: Vickki Hearing, MD;  Location: [facility]S;  Service: Orthopedics;  Laterality: Right;  medial and lateral menisectomy   LAMINECTOMY  1995   L4-L5   LAPAROSCOPIC CHOLECYSTECTOMY  1990s   LEFT HEART CATHETERIZATION WITH CORONARY ANGIOGRAM  01/10/2014   Procedure: LEFT HEART CATHETERIZATION WITH CORONARY ANGIOGRAM;  Surgeon: Lesleigh Noe, MD;  Location: [facility] CATH LAB;  Service: Cardiovascular;;   MICROLARYNGOSCOPY WITH LASER AND BALLOON DILATION N/A 11/17/2021   Procedure: MICROLARYNGOSCOPY WITH CO2 LASER, SUBGLOTTIC LARYNGEAL MASS BIOSPY;  Surgeon: Serena Colonel, MD;  Location: [facility] OR;  Service: ENT;  Laterality: N/A;   RIGHT HEART CATHETERIZATION N/A 01/10/2014   Procedure: RIGHT HEART CATH;  Surgeon: Lesleigh Noe, MD;  Location: [facility] CATH LAB;  Service: Cardiovascular;  Laterality: N/A;   TOTAL ABDOMINAL HYSTERECTOMY  1999   TRACHEOSTOMY REVISION N/A 07/18/2021   Procedure: TRACHEOSTOMY REVISION;  Surgeon: Suzanna Obey, MD;  Location: [facility] OR;  Service: ENT;  Laterality: N/A;   TRACHEOSTOMY TUBE PLACEMENT N/A 09/01/2017   Procedure: TRACHEOSTOMY;  Surgeon: Drema Halon, MD;  Location: [facility] OR;  Service: ENT;  Laterality: N/A;   VIDEO BRONCHOSCOPY Bilateral 01/23/2021   Procedure: VIDEO BRONCHOSCOPY WITHOUT FLUORO;  Surgeon: Lorin Glass, MD;  Location: [facility] ENDOSCOPY;  Service: Pulmonary;  Laterality: Bilateral;   VIDEO BRONCHOSCOPY Bilateral 01/24/2021   Procedure: VIDEO BRONCHOSCOPY WITHOUT FLUORO;  Surgeon: Lorin Glass, MD;  Location: [facility] ENDOSCOPY;  Service: Pulmonary;  Laterality: Bilateral;   VIDEO BRONCHOSCOPY N/A 01/26/2021   Procedure: VIDEO BRONCHOSCOPY WITHOUT FLUORO;  Surgeon: Lupita Leash, MD;  Location: [facility] ENDOSCOPY;  Service: Cardiopulmonary;  Laterality: N/A;    Current Outpatient Medications  Medication Sig  Dispense Refill   apixaban (ELIQUIS) 5 MG TABS tablet Take 1 tablet (5 mg total) by mouth 2 (two) times daily.     atorvastatin (LIPITOR) 80 MG tablet Take 1 tablet (80 mg total) by mouth daily.     Cholecalciferol (VITAMIN D3) 50 MCG (2000 UT) capsule Take 2,000 Units by mouth daily.     Continuous Glucose Sensor (FREESTYLE LIBRE 2 SENSOR) MISC by Does not apply route.     cyanocobalamin (VITAMIN B12) 500 MCG tablet Take 1 tablet (500 mcg total) by mouth daily. 90 tablet 3   dicyclomine (BENTYL) 10 MG capsule TAKE 1 CAPSULE BY MOUTH UP TO FOUR TIMES DAILY BEFORE MEALS AND AT BEDTIME (STOP IF CONSTIPATION) 120 capsule 3   donepezil (ARICEPT) 5 MG tablet Take 1 tablet (5  mg total) by mouth at bedtime.     DULoxetine (CYMBALTA) 30 MG capsule Take 1 capsule (30 mg total) by mouth daily. (Patient taking differently: Take 30 mg by mouth 2 (two) times daily.)  3   furosemide (LASIX) 40 MG tablet Take 1 tablet (40 mg total) by mouth daily. May take an additional 20 mg ( 0.5 tablet) as needed for swelling. 90 tablet 3   gabapentin (NEURONTIN) 100 MG capsule Take 1 capsule (100 mg total) by mouth 3 (three) times daily. 90 capsule 3   insulin glargine (LANTUS) 100 UNIT/ML injection Inject 0.15 mLs (15 Units total) into the skin daily. (Patient taking differently: Inject 20-24 Units into the skin at bedtime.) 10 mL 11   ipratropium-albuterol (DUONEB) 0.5-2.5 (3) MG/3ML SOLN Take 3 mLs by nebulization every 4 (four) hours as needed (Asthma).     levETIRAcetam (KEPPRA) 500 MG tablet Take 500 mg by mouth 2 (two) times daily.     levothyroxine (SYNTHROID) 175 MCG tablet Take 1 tablet (175 mcg total) by mouth daily at 6 (six) AM.     loperamide (IMODIUM) 2 MG capsule Take by mouth.     LORazepam (ATIVAN) 1 MG tablet Take 1 mg by mouth at bedtime.     melatonin 3 MG TABS tablet Take 1 tablet (3 mg total) by mouth at bedtime as needed.  0   metoprolol tartrate (LOPRESSOR) 25 MG tablet Take 1 tablet (25 mg total) by  mouth 2 (two) times daily.     Multiple Vitamins-Minerals (MULTIVITAMIN WITH MINERALS) tablet Take 1 tablet by mouth daily. For Diabetics     nitroGLYCERIN (NITROSTAT) 0.4 MG SL tablet  (Patient not taking: Reported on 12/28/2022)     NOVOLIN R FLEXPEN RELION 100 UNIT/ML FlexPen Inject 2-5 Units into the skin 3 (three) times daily before meals. Per sliding scale     pantoprazole (PROTONIX) 40 MG tablet Take 1 tablet (40 mg total) by mouth 2 (two) times daily before a meal. 60 tablet 3   potassium chloride (KLOR-CON) 10 MEQ tablet Take 20 mEq by mouth daily.     sertraline (ZOLOFT) 25 MG tablet Take 1 tablet (25 mg total) by mouth daily.     traMADol (ULTRAM) 50 MG tablet Take by mouth.     trolamine salicylate (ASPERCREME) 10 % cream Apply 1 application. topically as needed (knee pain).     No current facility-administered medications for this visit.    Allergies as of 01/11/2023 - Review Complete 12/28/2022  Allergen Reaction Noted   Celexa [citalopram hydrobromide] Other (See Comments) 06/30/2013   Codeine Nausea And Vomiting and Other (See Comments) 11/28/2019   Dilaudid [hydromorphone hcl] Other (See Comments) 12/31/2015   Reglan [metoclopramide] Other (See Comments) 06/04/2014   Hyoscyamine Other (See Comments) 09/05/2019    Family History  Problem Relation Age of Onset   Hypertension Mother    Alzheimer's disease Mother    Stroke Mother    Heart attack Mother    Breast cancer Sister    Hypertension Other    Heart disease Neg Hx    Colon cancer Neg Hx    Liver disease Neg Hx     Social History   Socioeconomic History   Marital status: Married    Spouse name: Not on file   Number of children: Not on file   Years of education: Not on file   Highest education level: Not on file  Occupational History   Occupation: Passenger transport manager at [employer]    Employer:  [employer]    Comment: disabled    Employer: RETIRED  Tobacco Use   Smoking status: Former    Packs/day: 0.25    Years:  15.00    Additional pack years: 0.00    Total pack years: 3.75    Types: Cigarettes    Start date: 02/26/1966    Quit date: 06/30/1993    Years since quitting: 29.5   Smokeless tobacco: Never   Tobacco comments:    quit in 1984  Vaping Use   Vaping Use: Never used  Substance and Sexual Activity   Alcohol use: Not Currently    Comment: Occasional alcohol 30 years ago (09/02/21)   Drug use: No   Sexual activity: Yes    Birth control/protection: None, Surgical  Other Topics Concern   Not on file  Social History Narrative   Sedentary   4 children, "blended family"   Social Determinants of Health   Financial Resource Strain: Not on file  Food Insecurity: No Food Insecurity (10/11/2022)   Hunger Vital Sign    Worried About Running Out of Food in the Last Year: Never true    Ran Out of Food in the Last Year: Never true  Transportation Needs: No Transportation Needs (10/11/2022)   PRAPARE - Administrator, Civil Service (Medical): No    Lack of Transportation (Non-Medical): No  Physical Activity: Not on file  Stress: Not on file  Social Connections: Not on file    Review of Systems: Gen: Denies fever, chills, anorexia. Denies fatigue, weakness, weight loss.  CV: Denies chest pain, palpitations, syncope, peripheral edema, and claudication. Resp: Denies dyspnea at rest, cough, wheezing, coughing up blood, and pleurisy. GI: Denies vomiting blood, jaundice, and fecal incontinence.   Denies dysphagia or odynophagia. Derm: Denies rash, itching, dry skin Psych: Denies depression, anxiety, memory loss, confusion. No homicidal or suicidal ideation.  Heme: Denies bruising, bleeding, and enlarged lymph nodes.  Physical Exam: There were no vitals taken for this visit. General:   Alert and oriented. No distress noted. Pleasant and cooperative.  Head:  Normocephalic and atraumatic. Eyes:  Conjuctiva clear without scleral icterus. Heart:  S1, S2 present without murmurs  appreciated. Lungs:  Clear to auscultation bilaterally. No wheezes, rales, or rhonchi. No distress.  Abdomen:  +BS, soft, non-tender and non-distended. No rebound or guarding. No HSM or masses noted. Msk:  Symmetrical without gross deformities. Normal posture. Extremities:  Without edema. Neurologic:  Alert and  oriented x4 Psych:  Normal mood and affect.    Assessment:     Plan:  ***   Ermalinda Memos, PA-C [facility] 01/11/2023

## 2023-01-09 NOTE — Progress Notes (Signed)
Please inform patient,  Follow up with hematology (established patient) due to low platelets count.  TSH panel Elevated increased synthroid 200 mg once daily before breakfast- Medication sent to pharmacy.  Hemoglobin A1c 9.0 indicates uncontrolled diabetes. Start jardiance 10 mg once daily- medication sent to pharmacy. Follow up with Endocrinology referral for insulin titration and type 2 diabetes management. It is important to follow a DASH diet which includes vegetables,fruits,whole grains, fat free or low fat diary,fish,poultry,beans,nuts and seeds,vegetable oils. Find an activity that you will enjoyandstart to be active at least 5 days a week for 30 minutes each day.    Your eGFR levels indicates moderate to  kidney function, I advise to keep your blood pressure controlled, maintain blood pressure reading goals under 130/80,  Managing your diabetes with a hemoglobin A1c goal less than 7. Keep your cholesterol under control to prevent further damage to blood vessels. Avoid NSAIDs medications and take tylenol for pain management.Consume a kidney friendly diet which includes Veggies: cauliflower, onions, eggplant, turnips. Low sodium, low to moderate intake of proteins: lean meats (poultry, fish), eggs, unsalted seafood. Avoid fatty foods, limit or avoid smoking and alcohol intake. Follow up in 3 months to recheck labs.

## 2023-01-11 ENCOUNTER — Encounter: Payer: Self-pay | Admitting: Family Medicine

## 2023-01-11 ENCOUNTER — Ambulatory Visit: Payer: PPO | Admitting: Gastroenterology

## 2023-01-11 IMAGING — DX DG CHEST 1V PORT
1 series · 1 of 1 positions shown · non-contrast
Comparison: 08/25/2020.

CLINICAL DATA: Intubation.

EXAM:
PORTABLE CHEST 1 VIEW

[chest ap]
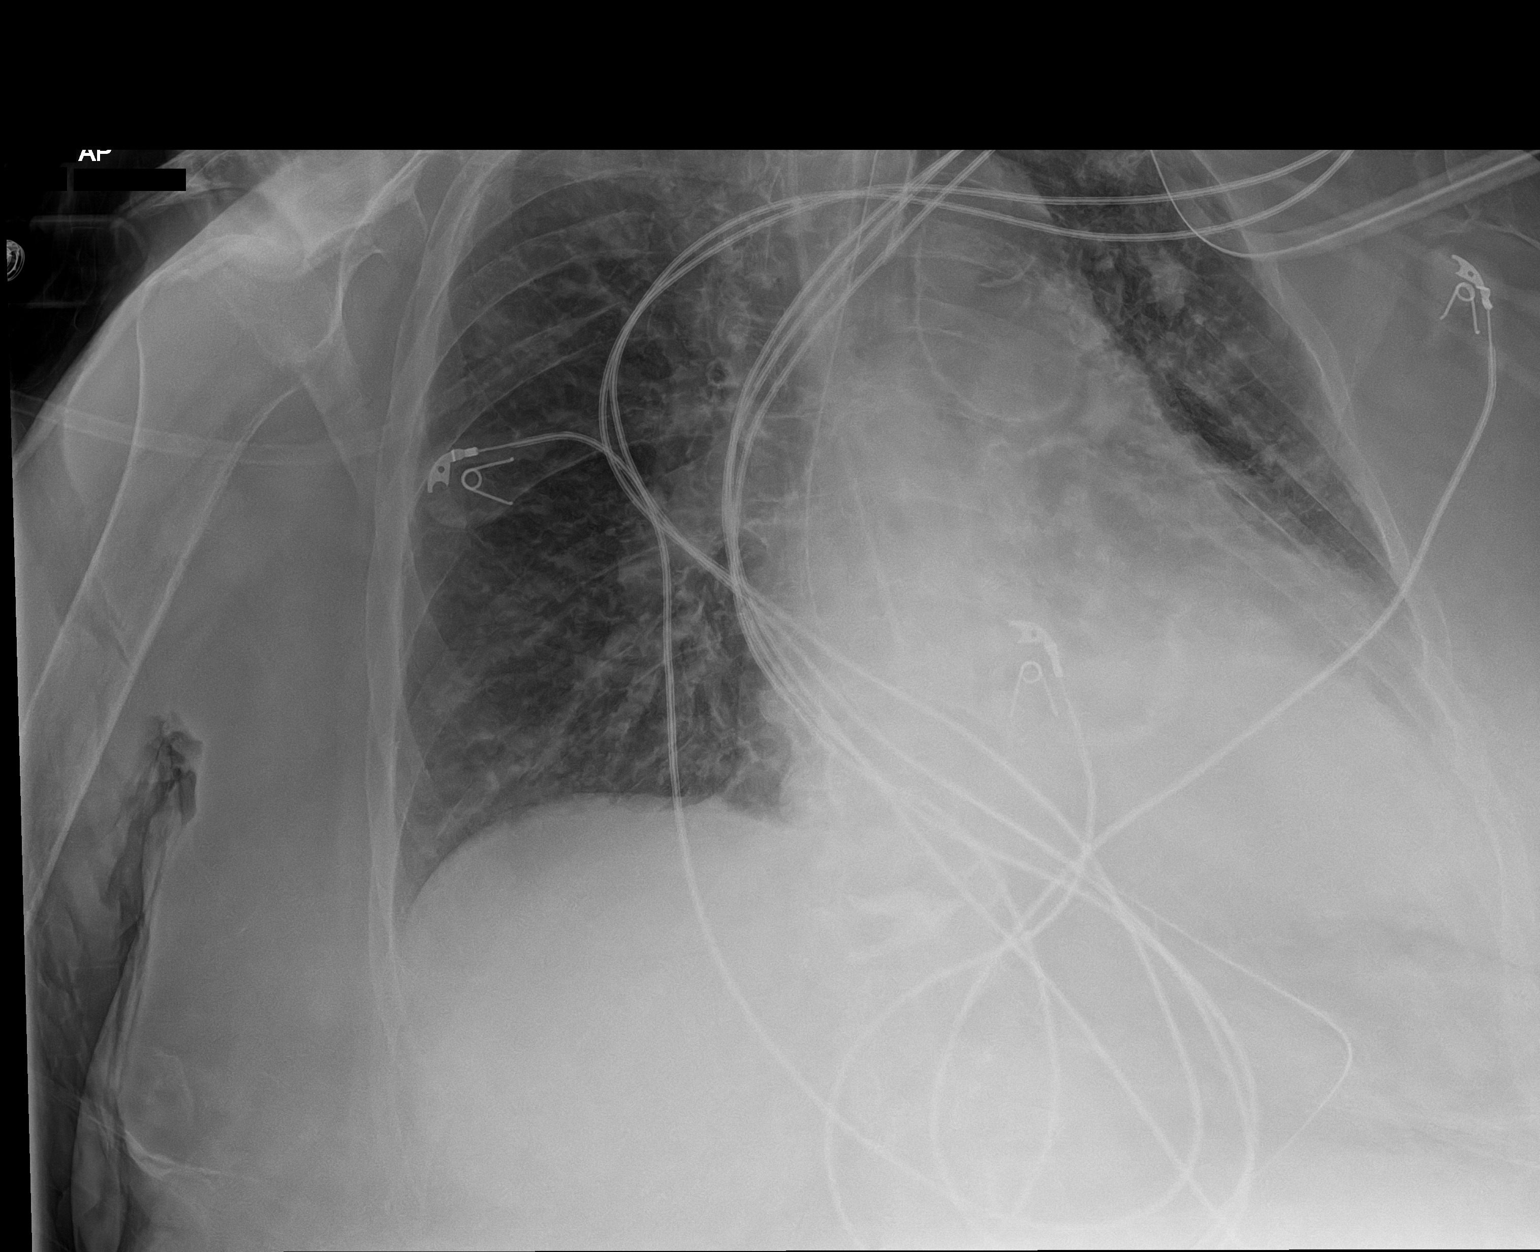

[1 of 1 positions shown; findings below may reference images not displayed]

FINDINGS: Endotracheal tube, NG tube, right IJ line in stable position. Stable
cardiomegaly. Low lung volumes persistent left base
atelectasis/infiltrate. Stable mild bilateral interstitial
prominence. Small left pleural effusion again noted. No
pneumothorax.
IMPRESSION: 1. Lines and tubes in stable position.
2. Low lung volumes. Persistent left base atelectasis/infiltrate and
small left pleural effusion. Stable mild bilateral interstitial
prominence.
3. Stable cardiomegaly.

## 2023-01-13 ENCOUNTER — Encounter: Payer: Self-pay | Admitting: Family Medicine

## 2023-01-14 IMAGING — US US CAROTID DUPLEX BILAT
1 series · 13 of 24 positions shown · non-contrast
Comparison: 12/28/2015

CLINICAL DATA: CVA. History hypertension, hyperlipidemia and
diabetes.

EXAM:
BILATERAL CAROTID DUPLEX ULTRASOUND
TECHNIQUE: Gray scale imaging, color Doppler and duplex ultrasound were
performed of bilateral carotid and vertebral arteries in the neck.

[Series 1: us carotid bilateral · 13 of 34 slices shown]
[im 1/34]
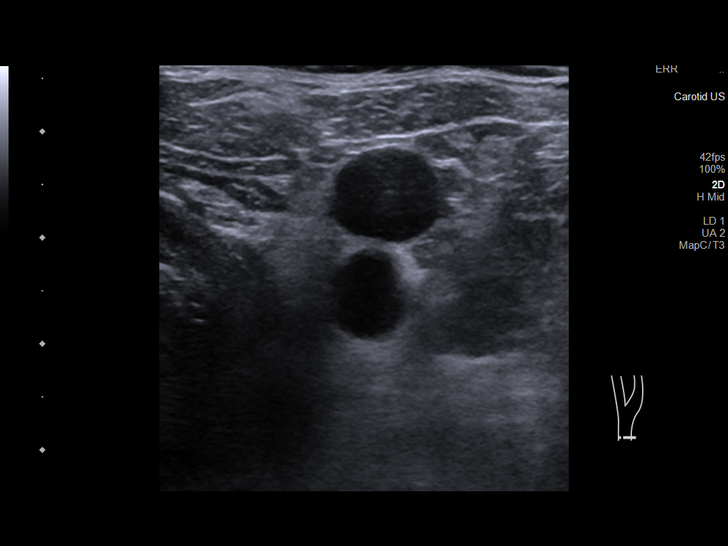
[im 3/34]
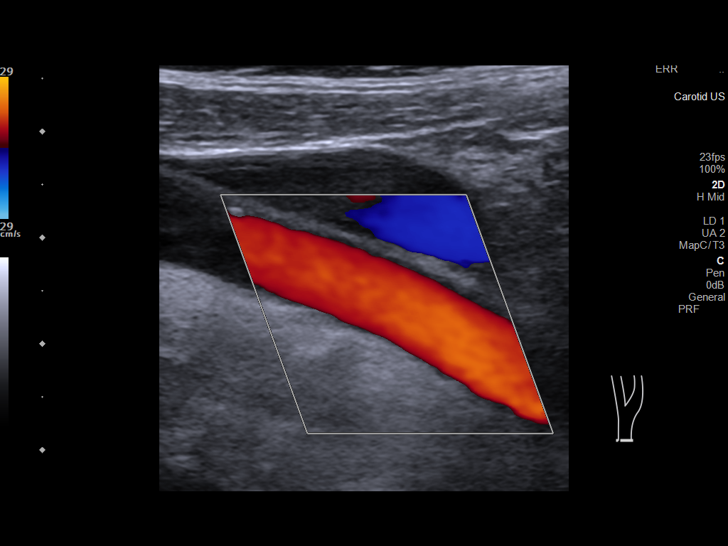
[im 6/34]
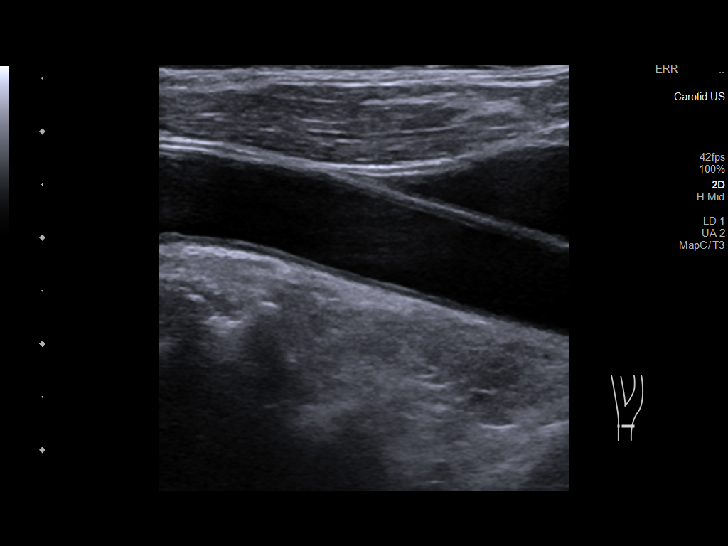
[im 9/34]
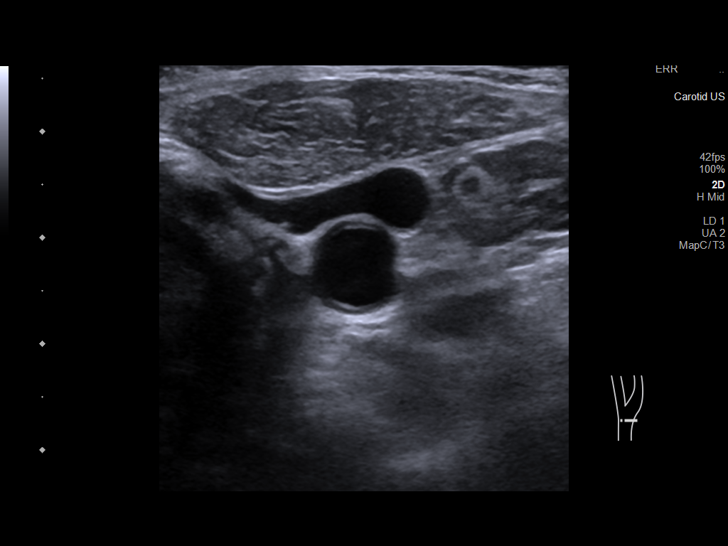
[im 12/34]
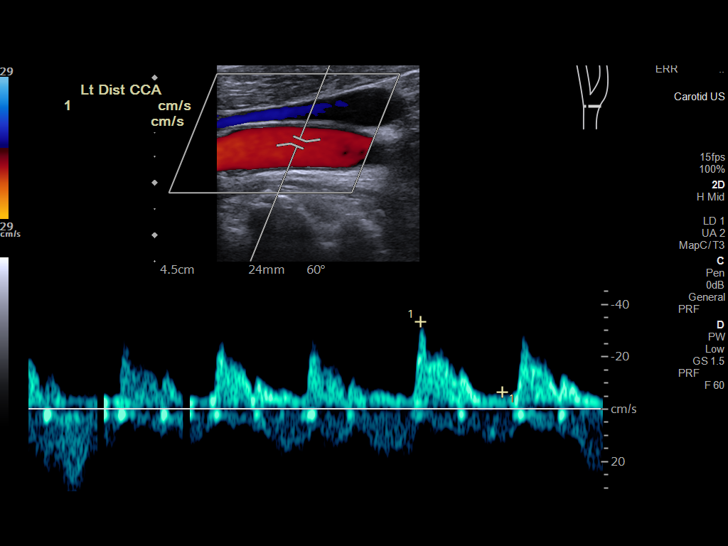
[im 15/34]
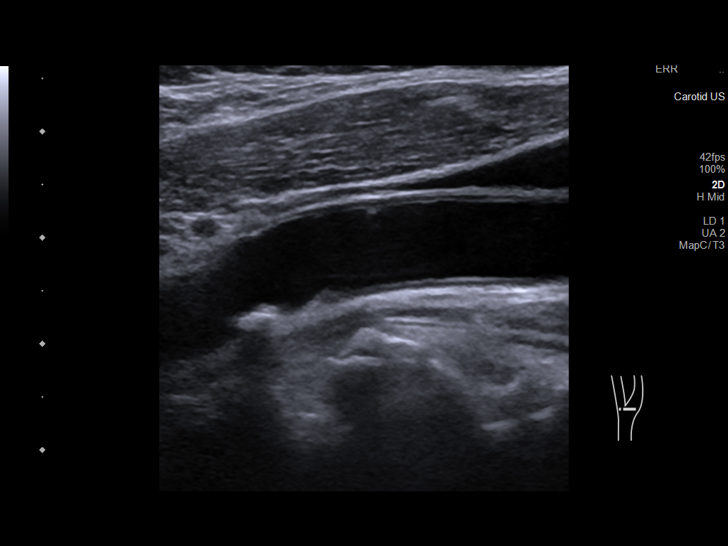
[im 18/34]
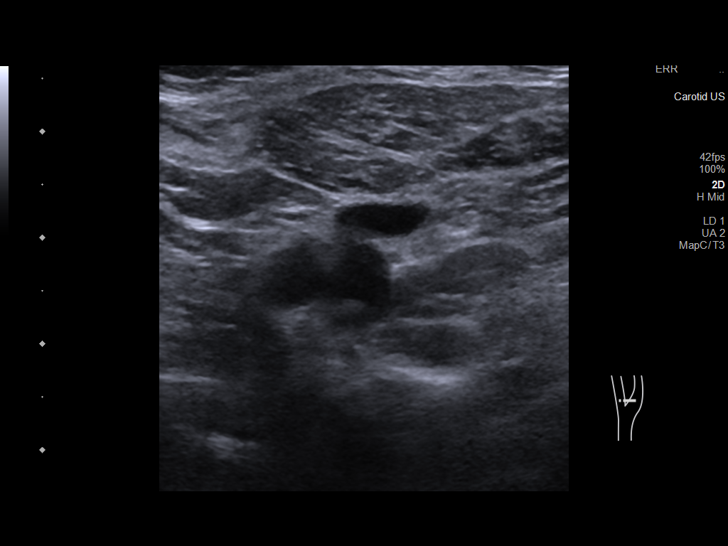
[im 19/34]
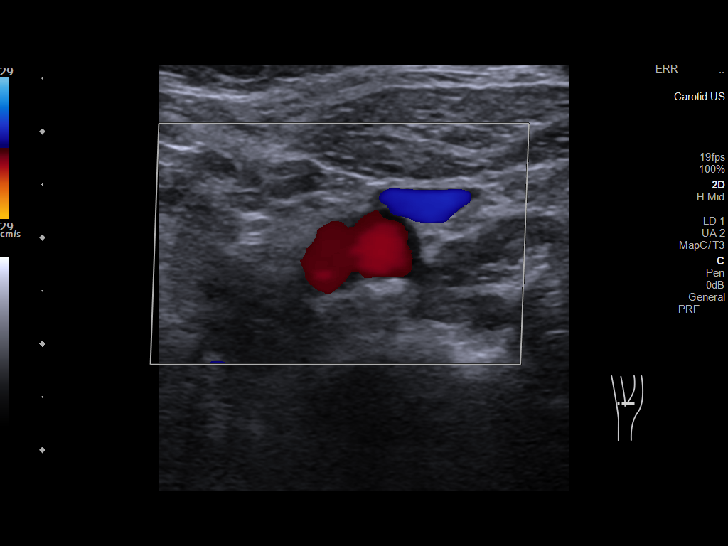
[im 22/34]
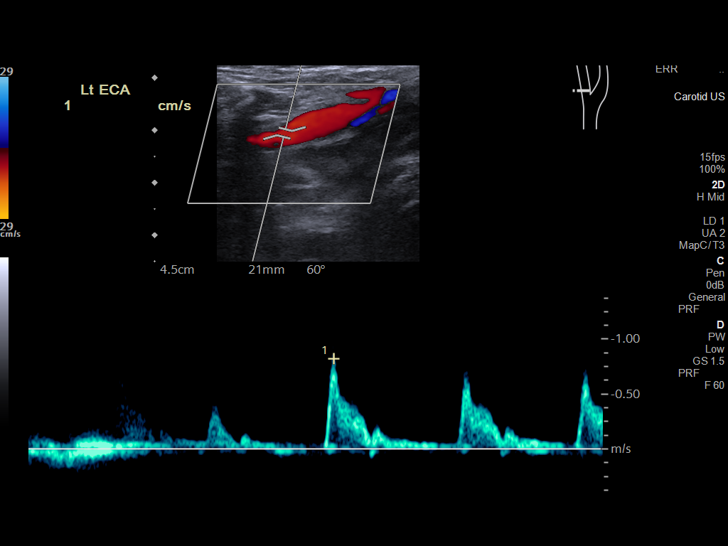
[im 25/34]
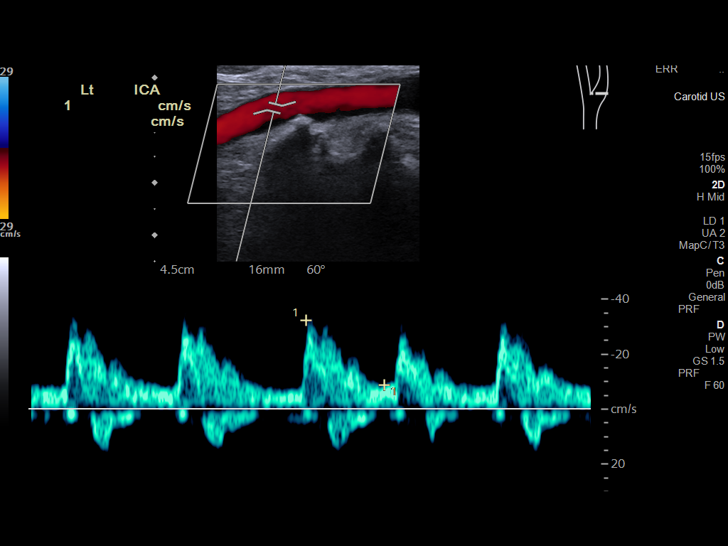
[im 28/34]
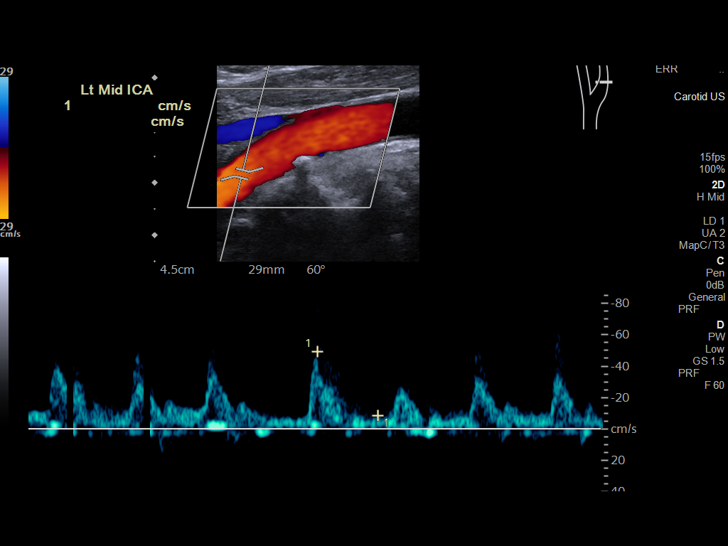
[im 31/34]
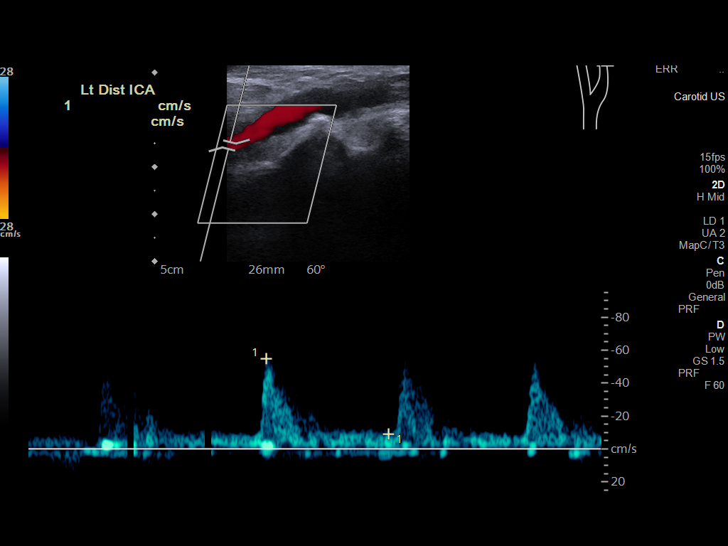
[im 34/34]
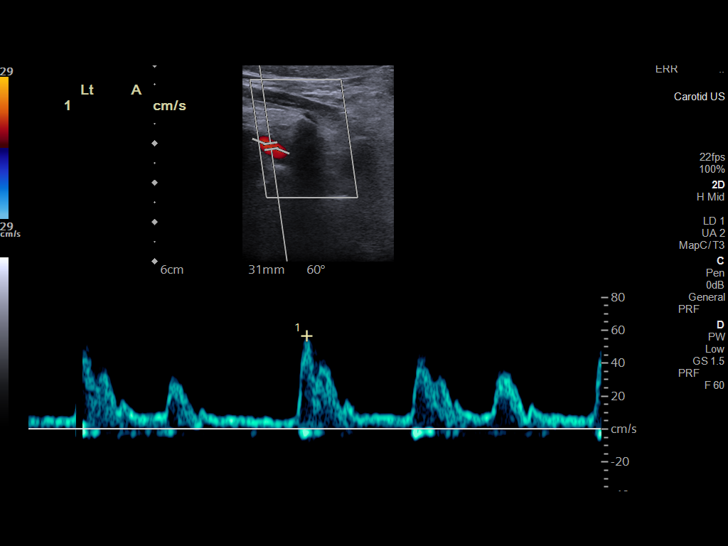

[13 of 24 positions shown; findings below may reference images not displayed]

FINDINGS: Criteria: Quantification of carotid stenosis is based on velocity
parameters that correlate the residual internal carotid diameter
with NASCET-based stenosis levels, using the diameter of the distal
internal carotid lumen as the denominator for stenosis measurement.

The following velocity measurements were obtained:

RIGHT

The right carotid system and vertebral artery were not evaluated
secondary to overlying bandages.

LEFT

ICA: 55/9 cm/sec

CCA: 57/10 cm/sec

SYSTOLIC ICA/CCA RATIO:

ECA: 82 cm/sec

LEFT CAROTID ARTERY: There is a minimal amount of eccentric
echogenic plaque within the left carotid bulb (image 15 and 17),
extending to involve the origin and proximal aspects of the left
internal carotid artery (image 25), not resulting in elevated peak
systolic velocities within the interrogated course the left internal
carotid artery to suggest a hemodynamically significant stenosis.

LEFT VERTEBRAL ARTERY:  Antegrade flow
IMPRESSION: 1. Minimal amount of left-sided atherosclerotic plaque,
morphologically similar to the 8409 examination and again not
resulting in hemodynamically significant stenosis.
2. The right carotid system and vertebral artery were not evaluated
secondary to overlying bandages. While no hemodynamically
significant narrowing was demonstrated on prior carotid Doppler
ultrasound performed in 8409, if clinical concern persists, further
evaluation with CTA of the head and neck could be performed as
indicated.

## 2023-01-15 IMAGING — DX DG CHEST 1V PORT
1 series · 1 of 1 positions shown · non-contrast
Comparison: August 29, 2020

CLINICAL DATA: Acute respiratory failure

EXAM:
PORTABLE CHEST 1 VIEW

[chest ap]
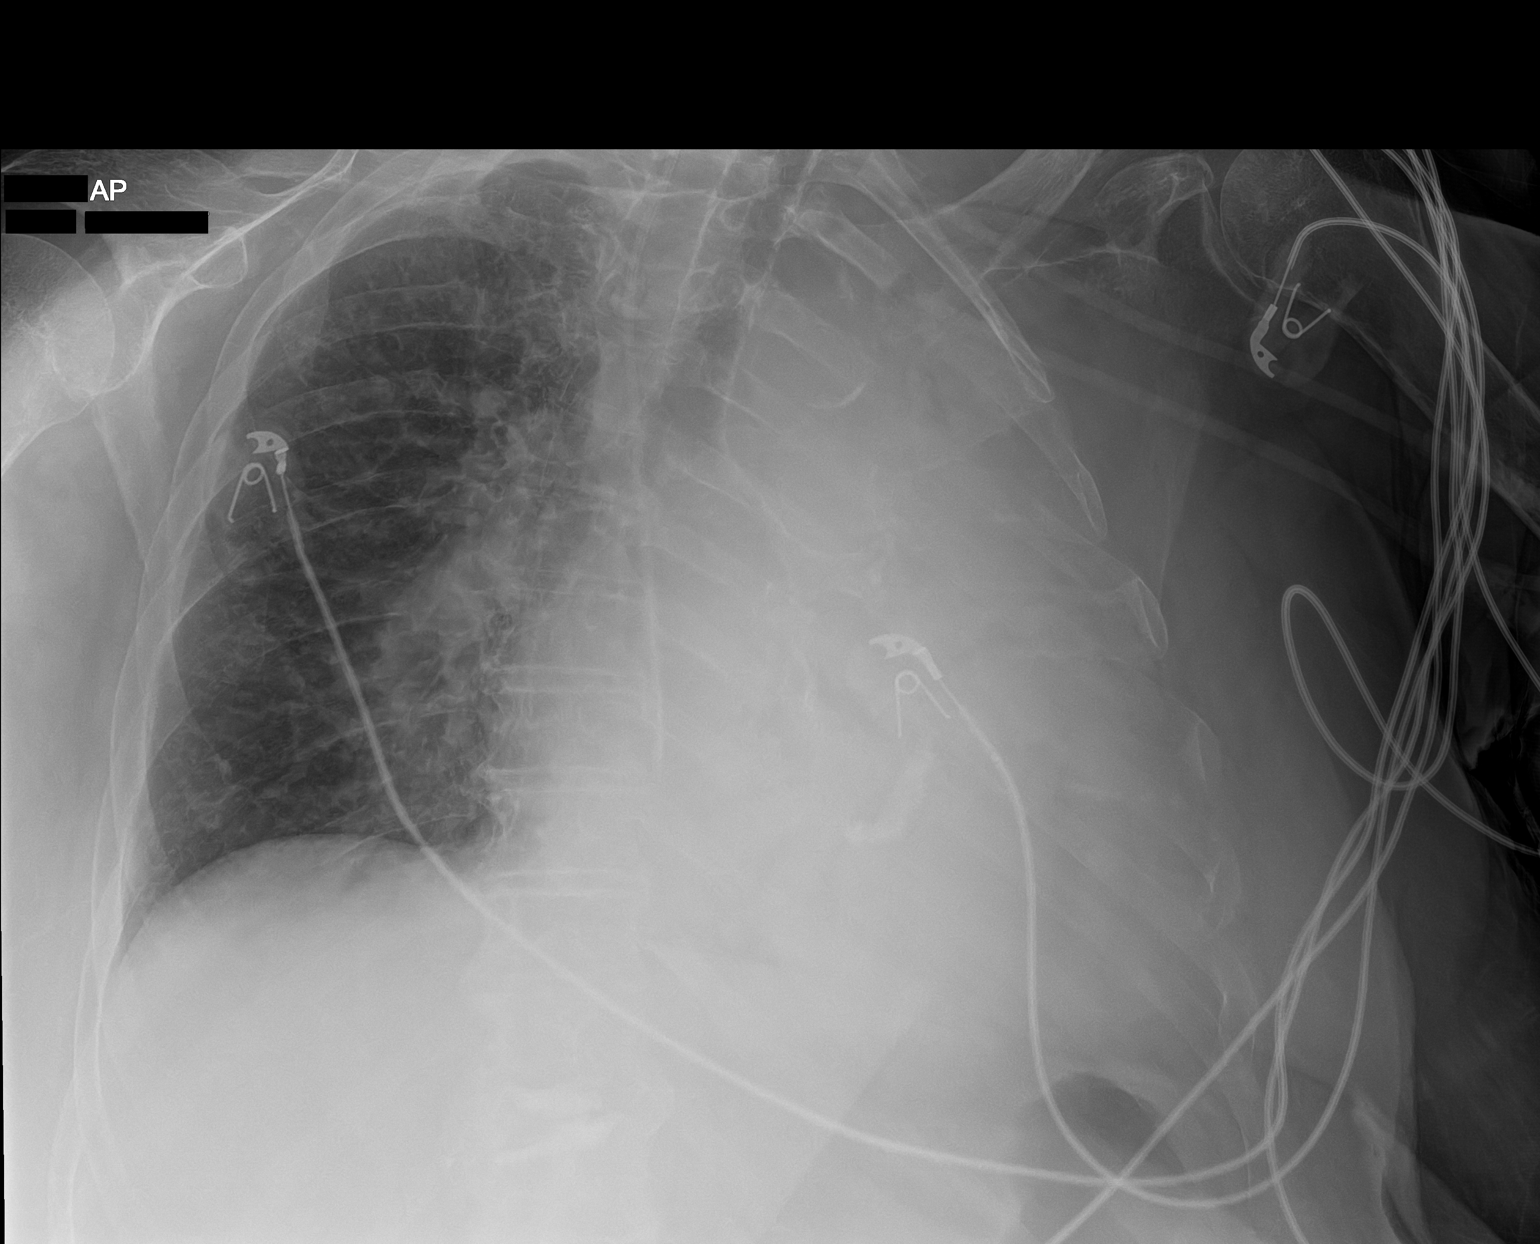

[1 of 1 positions shown; findings below may reference images not displayed]

FINDINGS: The cardiomediastinal silhouette is obscured. There is new complete
opacification of the LEFT lung. Interval extubation and removal of
enteric tube. RIGHT IJ CVC tip terminates over the region of the
RIGHT atrium. Mild interstitial edema of the RIGHT lung, unchanged
comparison to prior. Atherosclerotic calcifications of the aorta.
Degenerative changes of the thoracic spine. Status post vertebral
augmentation of a vertebral body at the thoracolumbar junction.
Visualized abdomen is unremarkable.
IMPRESSION: New complete opacification of the LEFT lung, most likely related to
mucous plugging or aspiration.

## 2023-01-15 IMAGING — DX DG CHEST 1V PORT
1 series · 1 of 1 positions shown · non-contrast
Comparison: Same day radiograph

CLINICAL DATA: Intubated

EXAM:
PORTABLE CHEST 1 VIEW

[chest ap]
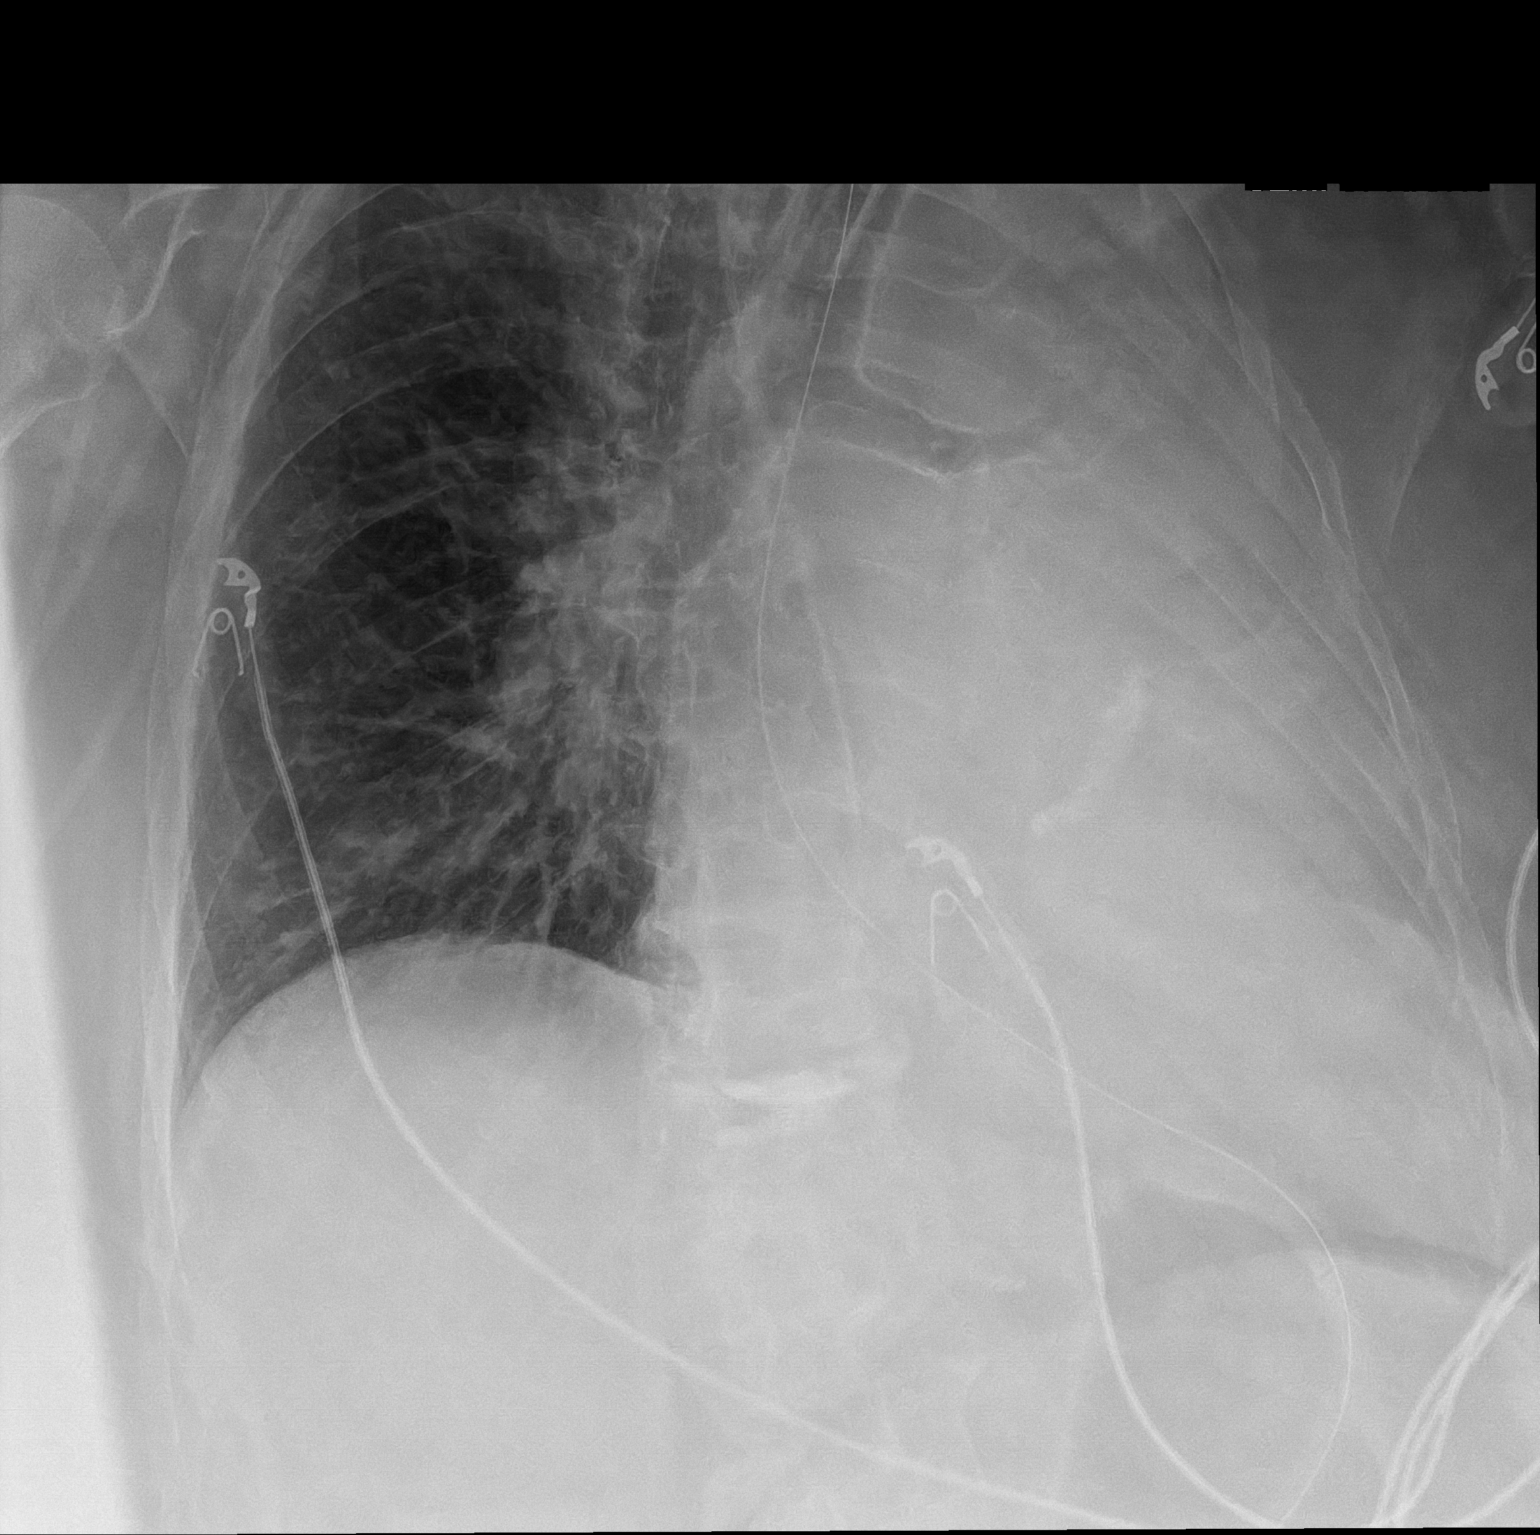

[1 of 1 positions shown; findings below may reference images not displayed]

FINDINGS: The cardiomediastinal silhouette is unchanged and partially obscured
in contour.ETT tip terminates 3 cm above the carina. The enteric
tube courses through the chest to the abdomen beyond the
field-of-view. RIGHT IJ CVC tip terminates over the RIGHT atrium. No
definitive pleural effusion. No pneumothorax. Persistent complete
opacification of the LEFT lung. Atherosclerotic calcifications.
Visualized abdomen is unremarkable. Multilevel degenerative changes
of the thoracic spine. Status post vertebral augmentation at the
thoracolumbar junction.
IMPRESSION: 1. Persistent complete opacification of the LEFT lung.
2. Support apparatus as described.

## 2023-01-16 IMAGING — DX DG CHEST 1V PORT
1 series · 1 of 1 positions shown · non-contrast
Comparison: Chest x-ray 08/30/2020.

CLINICAL DATA: 69-year-old female with history of mucous plug.
Check support apparatus.

EXAM:
PORTABLE CHEST 1 VIEW

[chest]
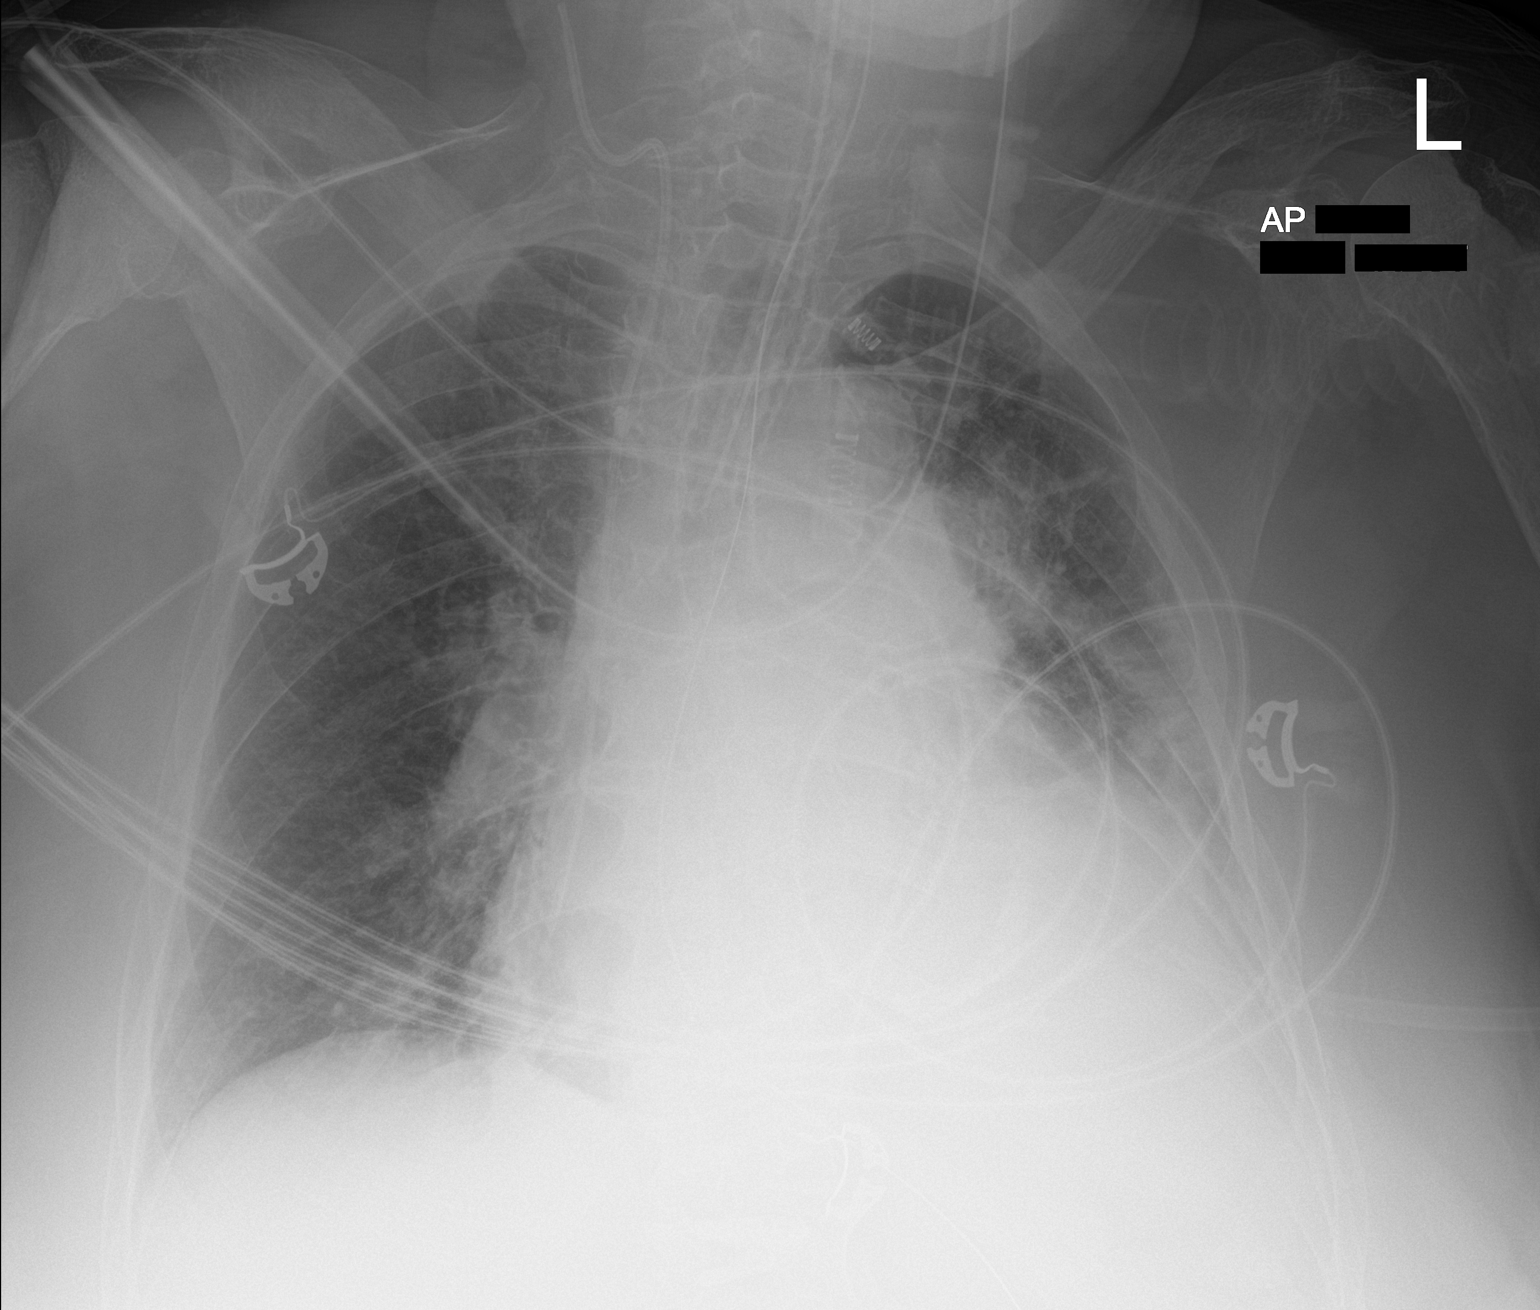

[1 of 1 positions shown; findings below may reference images not displayed]

FINDINGS: An endotracheal tube is in place with tip 3.1 cm above the carina.
There is a right-sided internal jugular central venous catheter with
tip terminating in the right atrium. A nasogastric tube is seen
extending into the stomach, however, the tip of the nasogastric tube
extends below the lower margin of the image. Lung volumes are low.
Improving aeration throughout the left upper lobe compared to the
prior study. Persistent dense opacification in the base of the left
hemithorax indicative of significant residual areas of atelectasis
and/or consolidation. Right lung is clear. Probable small left
pleural effusion. No definite right pleural effusion. Heart size is
mildly enlarged. Upper mediastinal contours are distorted by patient
rotation. Atherosclerotic calcifications in the thoracic aorta.
IMPRESSION: 1. Support apparatus, as above.
2. Improving aeration in the left upper lobe. Persistent areas of
atelectasis and/or consolidation throughout the left lower lobe with
small left pleural effusion.
3. Mild cardiomegaly.
4. Aortic atherosclerosis.

## 2023-01-17 NOTE — Progress Notes (Signed)
Referring Provider: Wylene Men* Primary Care Physician:  Rica Records, FNP Primary GI Physician: Dr. Marletta Lor  Chief Complaint  Patient presents with   Follow-up    For constipation and diarrhea. Pt says has gotten better. Has been eating greek yogurt, seems to help.    HPI:   [name] is a 72 y.o. female with history of CHF, restrictive lung disease, hospitalized in June 2022 with respiratory failure and required tracheostomy, IDA previously following with hematology and receiving IV iron due to oral iron intolerance,  IBS with diarrhea, chronic lower abdominal pain worsened postprandially with negative mesenteric [place] in 2021, gastroparesis, GERD, presenting today for  3 month follow-up of IBS   Last seen in the office 10/05/2022.  Reported alternating constipation and diarrhea.  She was having 1 or 2 bowel movements a day or skipping a day between bowel movements, occasional hard stools with straining.  Also noted some stool seepage at times.  She was taking dicyclomine 10 mg 3 times daily and using loperamide as needed, typically 1/day, but may take a second dose.  Small amount of rectal bleeding with an episode of constipation a few weeks ago with no recurrent symptoms.  Reported hemorrhoids.  No rectal pain.  Preferred to hold off on colonoscopy.  She was requesting to go back to see hematology due to her history of anemia.  Her hemoglobin was stable in the 9 range.  GERD well-controlled on pantoprazole 40 mg twice daily.  Recommended abdominal x-ray to evaluate stool burden, add Benefiber daily.  Otherwise, continue dicyclomine 3 times daily and Imodium as needed.  Also prescribed Anusol rectal cream for possible hemorrhoids.  If recurrent bleeding, recommended updating colonoscopy.   Abdominal x-ray showed mild fecal loading in the descending colon.  Results were discussed with patient's daughter who stated patient's bowel function was improving with Metamucil.   Recommended continuing this and following up in 3 months.   She did reestablish with hematology.  Her hemoglobin was stable at 10.1, but found to have very low normal iron panel on 10/11/2022.  She received IV iron 11/02/2022.  On 12/23/2022, hemoglobin improved to 10.9, and iron panel improved as well.   Today:  Reports her bowel function has improved.  She is taking fiber tablets about 4 out of 7 days a week as she forgets to take them on the other days.  She is also started eating [place] yogurt which has also helped.  She has formed stools about 75% of the time and loose stools about 25% of the time.  Not really skipping any days between bowel movements at this point.  Usually 2 bowel movements per day.  No rectal bleeding.  No abdominal pain with dicyclomine 4 times a day.  She does not feel that she can reduce dicyclomine as this helps significantly with her abdominal pain.  She does take Imodium, especially if going out somewhere.  She has significant anxiety about having a bowel movement out in public.  No NSAIDs.  Reports she used to take Aleve years ago.  GERD remains well-controlled on pantoprazole twice daily.  No nausea, vomiting, dysphagia.  No melena.  Not interested in any sort of endoscopic evaluation of anemia unless worsening symptoms.   Prior endoscopic evaluation:  EGD in May 2016 due to anemia and history of Barrett's without evidence of Barrett's. Last colonoscopy in May 2015 with redundant colon, moderate diverticulosis throughout the entire colon.  Capsule in April 2015  with idiopathic small bowel ulcers that have been present since 2013.       Past Medical History:  Diagnosis Date   Abnormal pulmonary function test    Anemia    H/H of 10/30 with a normal MCV in 12/09   Anxiety    Arthritis    Barrett's esophagus    Diagnosed 1995. Last EGD 2016-NO BARRETT'S.    Chest pain    a. 2022 Cath: nl cors; b. 2008 Neg myoview; c. 01/2014 Cath: Nl cors. EF 40%; c. 08/2020  NSTEMI/Cath [facility]): nl cors.   Chronic anticoagulation    Chronic combined systolic and diastolic CHF (congestive heart failure) (HCC)    a. 10/2014 Echo: EF40%; b. 01/2015 Echo: EF 55-60%; c. 11/2020 Echo: EF 55-60%, mod LVH, sev BAE, mild MR, mod TR. PASP ; d. 01/2021 Echo: EF 45-50%, glob HK, mod conc LVH, mod reduced RV fxn, RVSP 60.13mmHg, Mod TR, mild to mod MR, sev dil LA.   Chronic LBP    Surgical intervention in 1996   Diabetes mellitus, type 2 (HCC)    Insulin therapy; exacerbated by prednisone   Gastroparesis    99% retention 05/2008 on GES   GERD (gastroesophageal reflux disease)    Heart attack (HCC) 08/13/2020   Hiatal hernia    Hyperlipidemia    Hypertension    Hypothyroid    IBS (irritable bowel syndrome)    Obesity    OSA on CPAP    had CPAP and cannot tolerate.   Paroxysmal atrial fibrillation (HCC)    a.CHA2DS2VASc = 6-->eliquis. Also on Amio.   Pulmonary hypertension (HCC) 01/2015   a. 01/2015 RHC: Predominantly pulmonary venous hypertension but may be component of PAH (PA mean 52, PCWP 31); b. 01/2021 Echo: RVSP 60.96mmHg w/ mod reduced RV fxn.   Seizures (HCC)    last seizure was 2 years ago; on keppra for this; unknown etiology   Syncope    a. Admitted 05/2009; magnetic resonance imagin/ MRA - negative; etiology thought to be orthostasis secondary to drugs and dehydration. b. Syncope 02/2015 also felt 2/2 dehydration.    Past Surgical History:  Procedure Laterality Date   BACK SURGERY  1996   BIOPSY N/A 11/08/2013   Procedure: BIOPSY  / Tissue sampling / ulcers present in small intestine;  Surgeon: West Bali, MD;  Location: [facility];  Service: Endoscopy;  Laterality: N/A;   CARDIAC CATHETERIZATION  2002   CARDIAC CATHETERIZATION N/A 01/26/2015   Procedure: Right Heart Cath;  Surgeon: Laurey Morale, MD;  Location: [facility] INVASIVE CV LAB;  Service: Cardiovascular;  Laterality: N/A;   CARDIOVASCULAR STRESS TEST  2008   Stress nuclear study    CARDIOVERSION N/A 03/06/2015   Procedure: CARDIOVERSION;  Surgeon: Laurey Morale, MD;  Location: [facility] ENDOSCOPY;  Service: Cardiovascular;  Laterality: N/A;   CARPAL TUNNEL RELEASE  1994   CATARACT EXTRACTION W/PHACO Right 01/10/2022   Procedure: CATARACT EXTRACTION PHACO AND INTRAOCULAR LENS PLACEMENT (IOC);  Surgeon: [name], MD;  Location: [facility] ORS;  Service: Ophthalmology;  Laterality: Right;  CDE: 21.51   CATARACT EXTRACTION W/PHACO Left 01/24/2022   Procedure: CATARACT EXTRACTION PHACO AND INTRAOCULAR LENS PLACEMENT (IOC);  Surgeon: [name], MD;  Location: [facility];  Service: Ophthalmology;  Laterality: Left;  CDE: 17.13   COLONOSCOPY  11/2011   Dr. Darrick Penna: Internal hemorrhoids, mild diverticulosis. Random colon biopsies negative.   COLONOSCOPY N/A 11/08/2013   SLF: Normal mucosa in the terminal ileum/The colon IS redundant/  Moderate  diverticulosis throughout the entire colon. ileum bx benign. colon bx benign   CRYOTHERAPY  01/23/2021   Procedure: CRYOTHERAPY;  Surgeon: Lorin Glass, MD;  Location: [facility] ENDOSCOPY;  Service: Pulmonary;;   CRYOTHERAPY  01/24/2021   Procedure: Cloyde Reams;  Surgeon: Lorin Glass, MD;  Location: [facility] ENDOSCOPY;  Service: Pulmonary;;   CRYOTHERAPY  01/26/2021   Procedure: Cloyde Reams;  Surgeon: Lupita Leash, MD;  Location: [facility] ENDOSCOPY;  Service: Cardiopulmonary;;   ESOPHAGOGASTRODUODENOSCOPY  2008   Barrett's without dysplasia. esphagus dilated. antral erosions, h.pylori serologies negative.   ESOPHAGOGASTRODUODENOSCOPY  11/2011   Dr. Darrick Penna: Barrett's esophagus, mild gastritis, diverticulum in the second portion of the duodenum repeat EGD 3 years. Small bowel biopsies negative. Gastric biopsy show reactive gastropathy but no H. pylori. Esophageal biopsies consistent with GERD. Next EGD 11/2014   ESOPHAGOGASTRODUODENOSCOPY N/A 11/21/2014   WUJ:WJXB non-erosive gastritis/irregular z-line   ESOPHAGOGASTRODUODENOSCOPY  03/2022   FOREIGN BODY  REMOVAL  01/23/2021   Procedure: FOREIGN BODY REMOVAL;  Surgeon: Lorin Glass, MD;  Location: [name] ENDOSCOPY;  Service: Pulmonary;;   FOREIGN BODY REMOVAL  01/24/2021   Procedure: FOREIGN BODY REMOVAL;  Surgeon: Lorin Glass, MD;  Location: [facility] ENDOSCOPY;  Service: Pulmonary;;   GIVENS CAPSULE STUDY  12/07/2011   Proximal small bowel, rare AVM. Distal small bowel, multiple ulcers noted   GIVENS CAPSULE STUDY N/A 09/27/2013   Distal small bowel ulcers extending to TI.   GIVENS CAPSULE STUDY N/A 10/10/2013   Procedure: GIVENS CAPSULE STUDY;  Surgeon: West Bali, MD;  Location: [facility];  Service: Endoscopy;  Laterality: N/A;  7:30   HEMOSTASIS CONTROL  01/24/2021   Procedure: HEMOSTASIS CONTROL;  Surgeon: Lorin Glass, MD;  Location: [facility] ENDOSCOPY;  Service: Pulmonary;;   HEMOSTASIS CONTROL  01/26/2021   Procedure: HEMOSTASIS CONTROL;  Surgeon: Lupita Leash, MD;  Location: [facility] ENDOSCOPY;  Service: Cardiopulmonary;;   IR GASTROSTOMY TUBE MOD SED  01/11/2021   IR GASTROSTOMY TUBE REMOVAL  04/13/2021   IR KYPHO THORACIC WITH BONE BIOPSY  02/09/2018   KNEE ARTHROSCOPY WITH MEDIAL MENISECTOMY Right 06/09/2016   Procedure: KNEE ARTHROSCOPY WITH MEDIAL MENISECTOMY;  Surgeon: Vickki Hearing, MD;  Location: [facility];  Service: Orthopedics;  Laterality: Right;  medial and lateral menisectomy   LAMINECTOMY  1995   L4-L5   LAPAROSCOPIC CHOLECYSTECTOMY  1990s   LEFT HEART CATHETERIZATION WITH CORONARY ANGIOGRAM  01/10/2014   Procedure: LEFT HEART CATHETERIZATION WITH CORONARY ANGIOGRAM;  Surgeon: Lesleigh Noe, MD;  Location: [facility] CATH LAB;  Service: Cardiovascular;;   MICROLARYNGOSCOPY WITH LASER AND BALLOON DILATION N/A 11/17/2021   Procedure: MICROLARYNGOSCOPY WITH CO2 LASER, SUBGLOTTIC LARYNGEAL MASS BIOSPY;  Surgeon: Serena Colonel, MD;  Location: [facility] OR;  Service: ENT;  Laterality: N/A;   RIGHT HEART CATHETERIZATION N/A 01/10/2014   Procedure: RIGHT HEART CATH;  Surgeon: Lesleigh Noe, MD;  Location: [facility] CATH LAB;  Service: Cardiovascular;  Laterality: N/A;   TOTAL ABDOMINAL HYSTERECTOMY  1999   TRACHEOSTOMY REVISION N/A 07/18/2021   Procedure: TRACHEOSTOMY REVISION;  Surgeon: Suzanna Obey, MD;  Location: [facility] OR;  Service: ENT;  Laterality: N/A;   TRACHEOSTOMY TUBE PLACEMENT N/A 09/01/2017   Procedure: TRACHEOSTOMY;  Surgeon: Drema Halon, MD;  Location: [facility] OR;  Service: ENT;  Laterality: N/A;   VIDEO BRONCHOSCOPY Bilateral 01/23/2021   Procedure: VIDEO BRONCHOSCOPY WITHOUT FLUORO;  Surgeon: Lorin Glass, MD;  Location: [facility] ENDOSCOPY;  Service: Pulmonary;  Laterality: Bilateral;   VIDEO BRONCHOSCOPY Bilateral  01/24/2021   Procedure: VIDEO BRONCHOSCOPY WITHOUT FLUORO;  Surgeon: Lorin Glass, MD;  Location[facility] ENDOSCOPY;  Service: Pulmonary;  Laterality: Bilateral;   VIDEO BRONCHOSCOPY N/A 01/26/2021   Procedure: VIDEO BRONCHOSCOPY WITHOUT FLUORO;  Surgeon: Lupita Leash, MD;  Location: [facility] ENDOSCOPY;  Service: Cardiopulmonary;  Laterality: N/A;    Current Outpatient Medications  Medication Sig Dispense Refill   apixaban (ELIQUIS) 5 MG TABS tablet Take 1 tablet (5 mg total) by mouth 2 (two) times daily.     atorvastatin (LIPITOR) 80 MG tablet Take 1 tablet (80 mg total) by mouth daily.     Cholecalciferol (VITAMIN D3) 50 MCG (2000 UT) capsule Take 1 capsule (2,000 Units total) by mouth daily. 90 capsule 1   cyanocobalamin (VITAMIN B12) 500 MCG tablet Take 1 tablet (500 mcg total) by mouth daily. 90 tablet 3   dicyclomine (BENTYL) 10 MG capsule TAKE 1 CAPSULE BY MOUTH UP TO FOUR TIMES DAILY BEFORE MEALS AND AT BEDTIME (STOP IF CONSTIPATION) 120 capsule 3   donepezil (ARICEPT) 5 MG tablet Take 1 tablet (5 mg total) by mouth at bedtime.     DULoxetine (CYMBALTA) 30 MG capsule Take 1 capsule (30 mg total) by mouth daily. (Patient taking differently: Take 30 mg by mouth 2 (two) times daily.)  3   empagliflozin (JARDIANCE) 10 MG TABS tablet Take 1 tablet  (10 mg total) by mouth daily. 30 tablet 3   furosemide (LASIX) 40 MG tablet Take 1 tablet (40 mg total) by mouth daily. May take an additional 20 mg ( 0.5 tablet) as needed for swelling. 90 tablet 3   gabapentin (NEURONTIN) 100 MG capsule Take 1 capsule (100 mg total) by mouth 3 (three) times daily. 90 capsule 3   insulin glargine (LANTUS) 100 UNIT/ML injection Inject 0.15 mLs (15 Units total) into the skin daily. (Patient taking differently: Inject 20-24 Units into the skin at bedtime.) 10 mL 11   ipratropium-albuterol (DUONEB) 0.5-2.5 (3) MG/3ML SOLN Take 3 mLs by nebulization every 4 (four) hours as needed (Asthma).     levETIRAcetam (KEPPRA) 500 MG tablet Take 500 mg by mouth 2 (two) times daily.     levothyroxine (SYNTHROID) 200 MCG tablet Take 1 tablet (200 mcg total) by mouth daily. 90 tablet 1   loperamide (IMODIUM) 2 MG capsule Take by mouth.     LORazepam (ATIVAN) 1 MG tablet Take 1 mg by mouth at bedtime.     melatonin 3 MG TABS tablet Take 1 tablet (3 mg total) by mouth at bedtime as needed.  0   metoprolol tartrate (LOPRESSOR) 25 MG tablet Take 1 tablet (25 mg total) by mouth 2 (two) times daily.     Multiple Vitamins-Minerals (MULTIVITAMIN WITH MINERALS) tablet Take 1 tablet by mouth daily. For Diabetics     nitroGLYCERIN (NITROSTAT) 0.4 MG SL tablet      NOVOLIN R FLEXPEN RELION 100 UNIT/ML FlexPen Inject 2-5 Units into the skin 3 (three) times daily before meals. Per sliding scale     pantoprazole (PROTONIX) 40 MG tablet Take 1 tablet (40 mg total) by mouth 2 (two) times daily before a meal. 60 tablet 3   potassium chloride (KLOR-CON) 10 MEQ tablet Take 20 mEq by mouth daily.     sertraline (ZOLOFT) 25 MG tablet Take 1 tablet (25 mg total) by mouth daily.     traMADol (ULTRAM) 50 MG tablet Take 50 mg by mouth 3 (three) times daily.     trolamine salicylate (ASPERCREME) 10 % cream  Apply 1 application. topically as needed (knee pain).     Continuous Glucose Sensor (FREESTYLE LIBRE 2  SENSOR) MISC by Does not apply route.     No current facility-administered medications for this visit.    Allergies as of 01/19/2023 - Review Complete 01/19/2023  Allergen Reaction Noted   Celexa [citalopram hydrobromide] Other (See Comments) 06/30/2013   Codeine Nausea And Vomiting and Other (See Comments) 11/28/2019   Dilaudid [hydromorphone hcl] Other (See Comments) 12/31/2015   Reglan [metoclopramide] Other (See Comments) 06/04/2014   Hyoscyamine Other (See Comments) 09/05/2019    Family History  Problem Relation Age of Onset   Hypertension Mother    Alzheimer's disease Mother    Stroke Mother    Heart attack Mother    Breast cancer Sister    Hypertension Other    Heart disease Neg Hx    Colon cancer Neg Hx    Liver disease Neg Hx     Social History   Socioeconomic History   Marital status: Married    Spouse name: Not on file   Number of children: Not on file   Years of education: Not on file   Highest education level: Not on file  Occupational History   Occupation: Passenger transport manager at [employer]    Employer: [employer]    Comment: disabled    Employer: RETIRED  Tobacco Use   Smoking status: Former    Current packs/day: 0.00    Average packs/day: 0.3 packs/day for 27.3 years (6.8 ttl pk-yrs)    Types: Cigarettes    Start date: 02/26/1966    Quit date: 06/30/1993    Years since quitting: 29.5   Smokeless tobacco: Never   Tobacco comments:    quit in 1984  Vaping Use   Vaping status: Never Used  Substance and Sexual Activity   Alcohol use: Not Currently    Comment: Occasional alcohol 30 years ago (09/02/21)   Drug use: No   Sexual activity: Yes    Birth control/protection: None, Surgical  Other Topics Concern   Not on file  Social History Narrative   Sedentary   4 children, "blended family"   Social Determinants of Health   Financial Resource Strain: Not on file  Food Insecurity: No Food Insecurity (10/11/2022)   Hunger Vital Sign    Worried About Running Out of  Food in the Last Year: Never true    Ran Out of Food in the Last Year: Never true  Transportation Needs: No Transportation Needs (10/11/2022)   PRAPARE - Administrator, Civil Service (Medical): No    Lack of Transportation (Non-Medical): No  Physical Activity: Not on file  Stress: Not on file  Social Connections: Not on file    Review of Systems: Gen: Denies fever, chills, cold or flulike symptoms, presyncope, syncope. CV: Denies chest pain, palpitations. Resp: Denies dyspnea, cough.  GI: See HPI Heme: See HPI  Physical Exam: BP (!) 104/51 (BP Location: Left Arm, Patient Position: Sitting, Cuff Size: Normal)   Pulse 64   Temp (!) 97.3 F (36.3 C) (Temporal)   Ht 5\' 6"  (1.676 m)   Wt 183 lb 6.4 oz (83.2 kg)   SpO2 97% Comment: 2L of O2  BMI 29.60 kg/m  General:   Alert and oriented. No distress noted. Pleasant and cooperative.  Has tracheostomy.  Head:  Normocephalic and atraumatic. Eyes:  Conjuctiva clear without scleral icterus. Heart:  S1, S2 present without murmurs appreciated. Lungs:  Clear to auscultation bilaterally. No  wheezes, rales, or rhonchi. No distress.  Abdomen:  +BS, soft, non-tender and non-distended. No rebound or guarding. No HSM or masses noted. Msk:  Symmetrical without gross deformities. Normal posture. Extremities:  Without edema. Neurologic:  Alert and  oriented x4 Psych:  Normal mood and affect.    Assessment:   72 y.o. female with history of CHF, restrictive lung disease, hospitalized in June 2022 with respiratory failure and required tracheostomy, IDA previously following with hematology and receiving IV iron due to oral iron intolerance,  IBS with diarrhea, chronic lower abdominal pain worsened postprandially with negative mesenteric [place] in 2021, gastroparesis, GERD, presenting today for  3 month follow-up of IBS.   IBS with alternating constipation and diarrhea: Alternating between constipation and diarrhea likely secondary to baseline  constipation with overflow diarrhea.  Abdominal x-ray in April showed mild fecal loading in the descending colon.  She has since started fiber supplement but forgetting to take it about 3 days a week and also started eating [place] yogurt which has helped with her bowel function.  She is now having bowel movements daily.  Has loose stools about 25% of the time, otherwise formed stools.  No alarm symptoms.  She is still taking dicyclomine 4 times a day as this keeps her abdominal pain away.  She is very leery to decrease dicyclomine.  She also takes Imodium as needed, primarily when going out as she has significant anxiety about having a bowel movement in public.  Discussed that this will worsen her baseline constipation and encouraged her to limit this is much as possible.  GERD: Well-controlled on pantoprazole 40 mg twice daily.  No alarm symptoms.  IDA: Chronic.  Following with hematology and receiving IV iron as needed.  Hemoglobin has been fairly stable.  No overt GI bleeding.  Prior endoscopic evaluation in 2015/2016 with EGD, colonoscopy, capsule endoscopy, only remarkable for idiopathic small bowel ulcers that had been present since 2013.  Patient does report that she was previously taking Aleve, but is no longer taking NSAIDs. No indication for endoscopic evaluation at this time. Additionally, patient is not interested in endoscopic evaluation unless she were to develop alarm symptoms/declining hemoglobin.   Plan:  Start taking fiber supplement every day. Consume plenty of fruits and vegetables daily. Drink enough water to keep urine pale yellow to clear. Limit Imodium unless having frequent loose stools. Continue dicyclomine up to 4 times daily for abdominal pain. Continue pantoprazole 40 mg twice daily. Continue to follow with hematology. Follow-up in 6 months or sooner if needed.   Ermalinda Memos, PA-C [facility] Gastroenterology 01/19/2023

## 2023-01-19 ENCOUNTER — Ambulatory Visit: Payer: PPO | Admitting: Gastroenterology

## 2023-01-19 ENCOUNTER — Encounter: Payer: Self-pay | Admitting: Gastroenterology

## 2023-01-19 VITALS — BP 104/51 | HR 64 | Temp 97.3°F | Ht 66.0 in | Wt 183.4 lb

## 2023-01-19 DIAGNOSIS — D509 Iron deficiency anemia, unspecified: Secondary | ICD-10-CM

## 2023-01-19 DIAGNOSIS — K219 Gastro-esophageal reflux disease without esophagitis: Secondary | ICD-10-CM

## 2023-01-19 DIAGNOSIS — R198 Other specified symptoms and signs involving the digestive system and abdomen: Secondary | ICD-10-CM | POA: Diagnosis not present

## 2023-01-19 IMAGING — DX DG CHEST 1V PORT
1 series · 1 of 1 positions shown · non-contrast
Comparison: 08/31/2020

CLINICAL DATA: Endotracheal tube position

EXAM:
PORTABLE CHEST 1 VIEW

[chest ap]
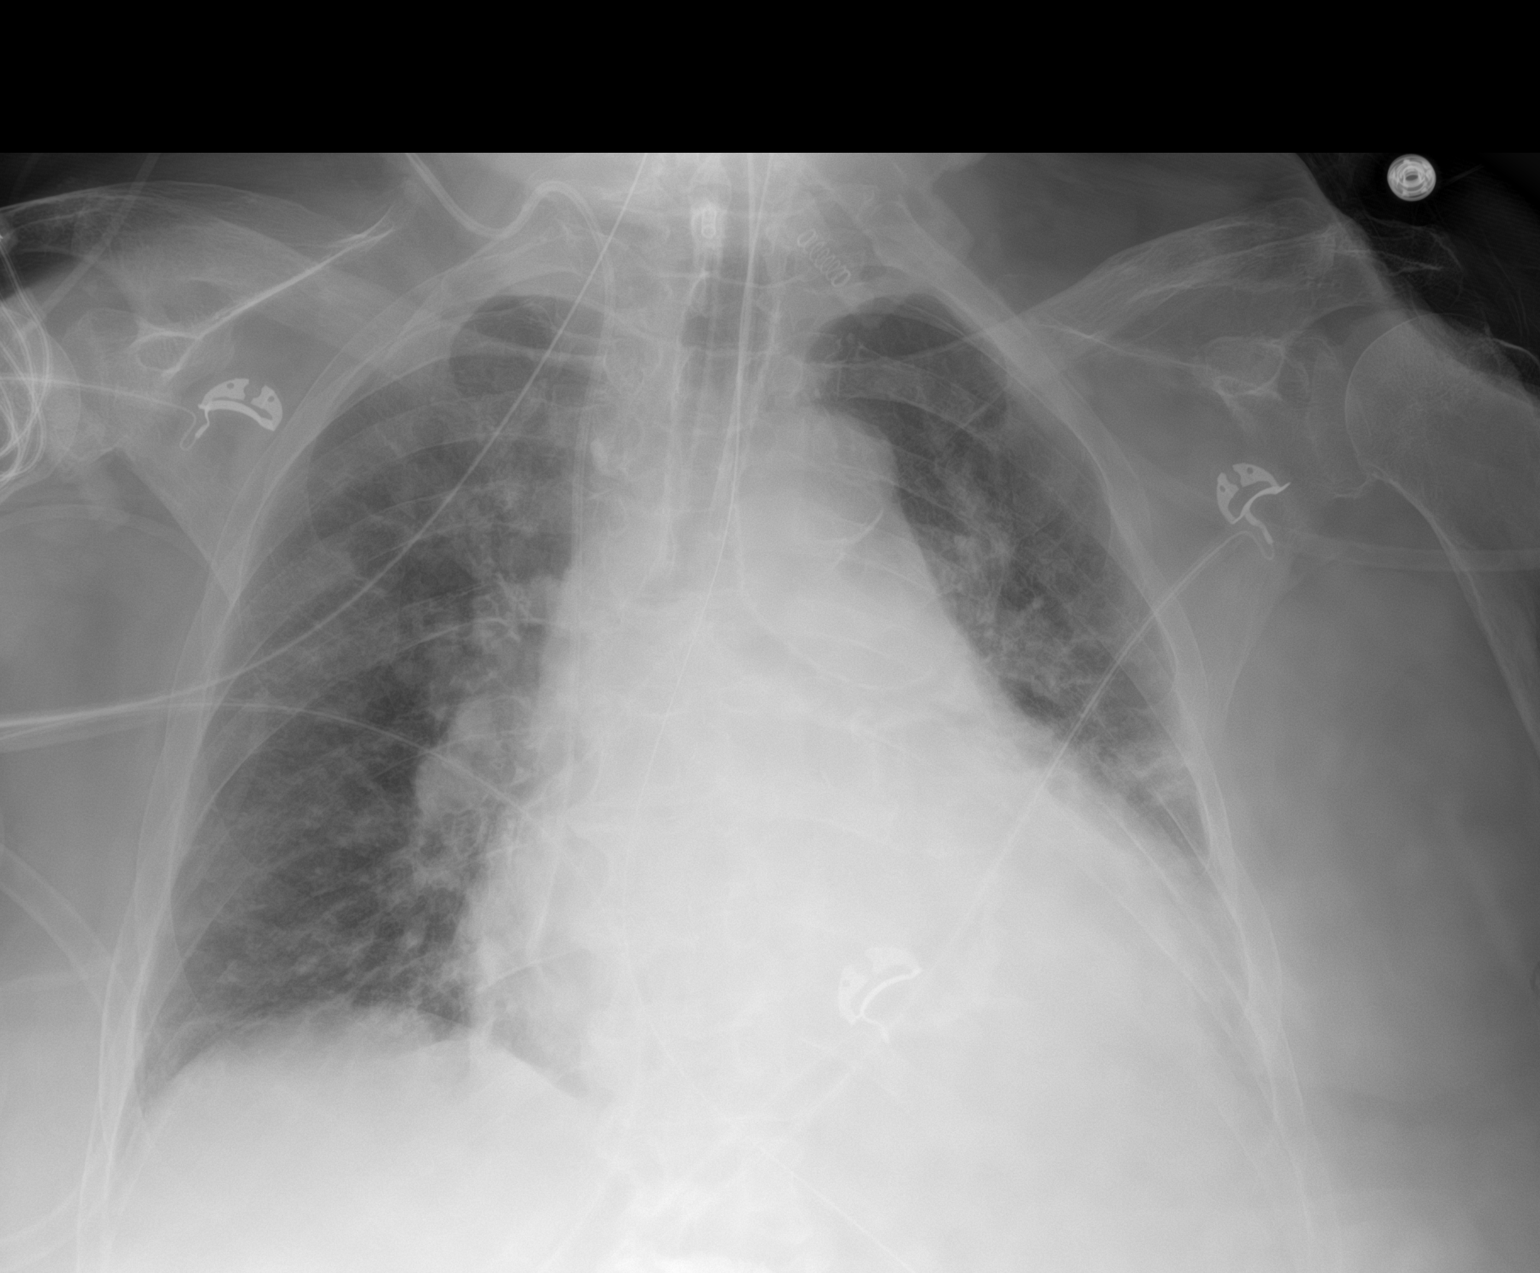

[1 of 1 positions shown; findings below may reference images not displayed]

FINDINGS: Cardiac enlargement. Mild vascular congestion without edema. Left
lower lobe consolidation unchanged. Mild right lower lobe
atelectasis. No significant effusion

Endotracheal tube 2 cm above the carina. NG tube in the stomach with
the tip not visualized. Right jugular central venous catheter tip in
the right atrium unchanged. No pneumothorax.
IMPRESSION: Endotracheal tube 2 cm above the carina unchanged. Central venous
catheter tip in the mid right atrium

Cardiac enlargement with mild vascular congestion. Left lower lobe
consolidation unchanged.

## 2023-01-19 NOTE — Patient Instructions (Addendum)
As we discussed, I suspect you have baseline constipation with some overflow intermittent diarrhea.  It is important that we get your bowels to move well every day to prevent the fluctuations from constipation to diarrhea.  Increase your daily fiber supplement.  Be sure to take this every day to help with your bowel regularity.  Be sure to consume plenty of fruits and vegetables on a daily basis.  Drink enough water to keep urine pale yellow to clear.  Limit taking Imodium unless having frequent loose stools as this will worsen baseline constipation.    Continue using dicyclomine up to 4 times daily for abdominal pain.   Continue pantoprazole 40 mg twice daily for reflux.  Will follow-up with you in 6 months or sooner if needed.  It was great to see you again today!  Ermalinda Memos, PA-C [facility] Gastroenterology

## 2023-01-21 IMAGING — DX DG CHEST 1V PORT
1 series · 1 of 1 positions shown · non-contrast
Comparison: 09/03/2020

CLINICAL DATA: Acute respiratory failure

EXAM:
PORTABLE CHEST 1 VIEW

[chest ap]
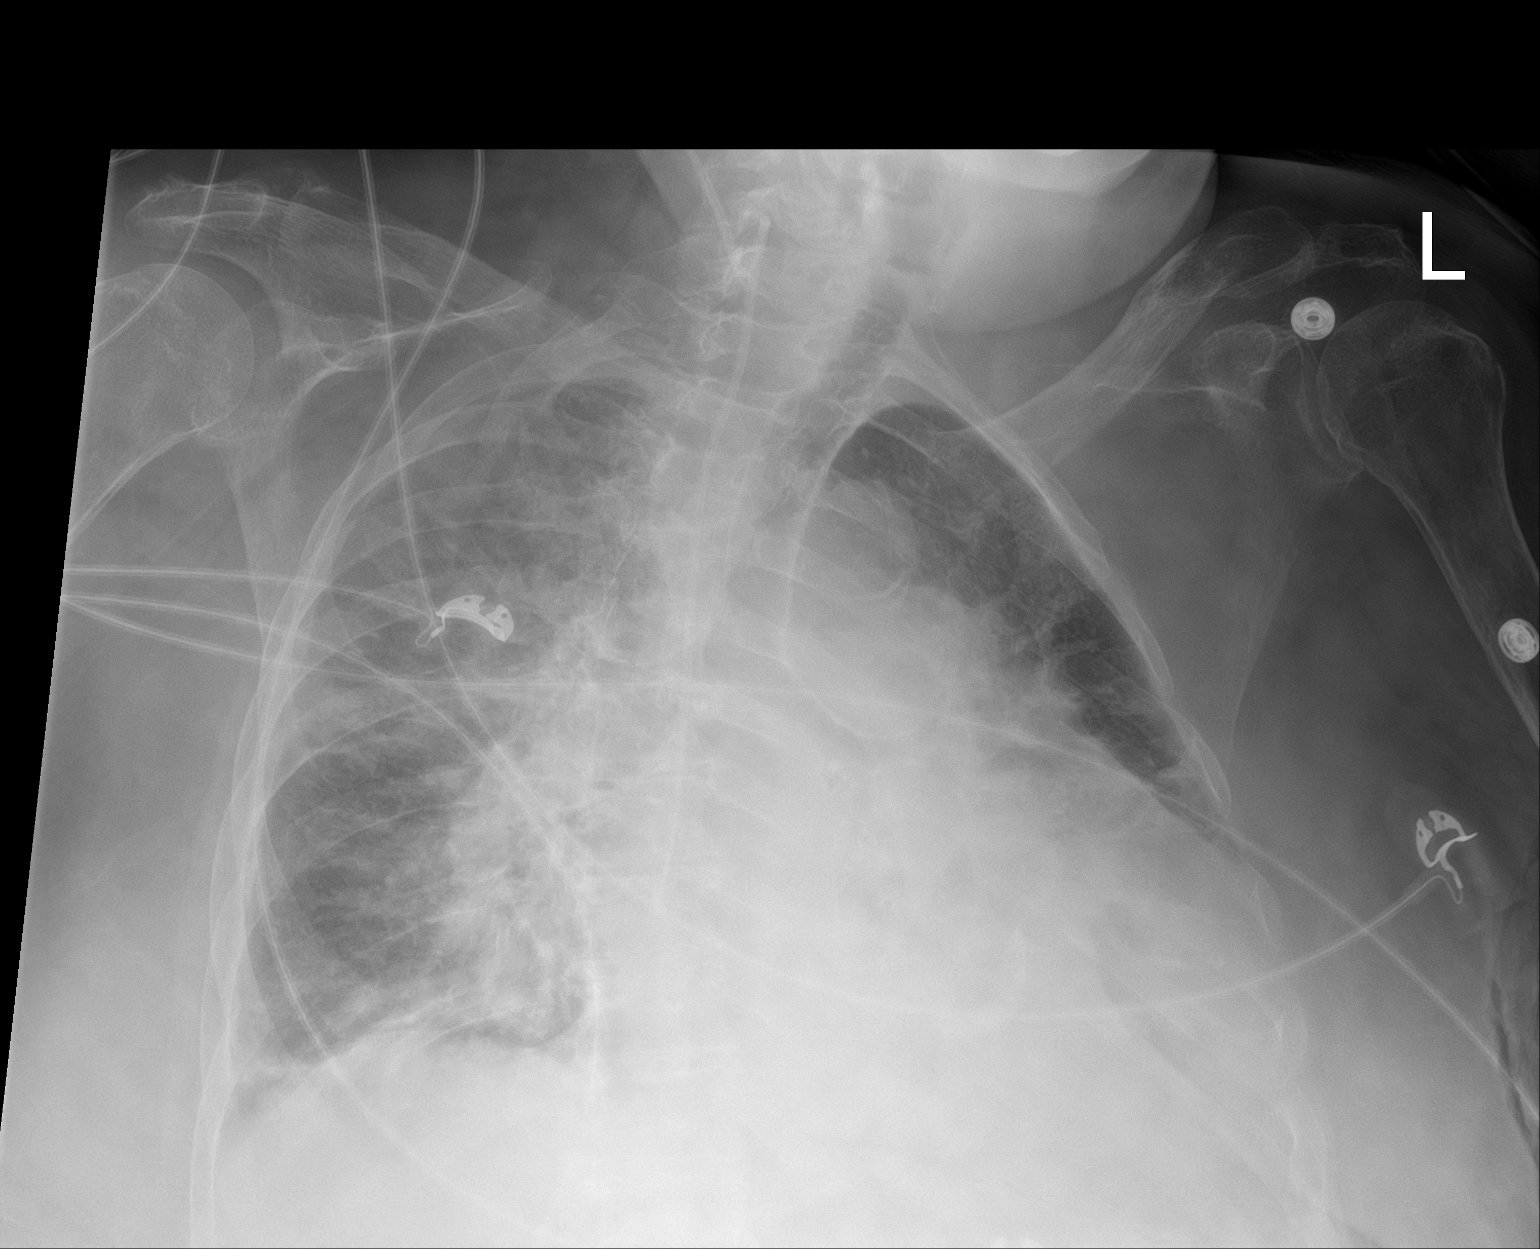

[1 of 1 positions shown; findings below may reference images not displayed]

FINDINGS: Interval extubation. Nasogastric tube removed. Right internal
jugular central venous catheter is unchanged with its tip within the
superior right atrium.

Pulmonary insufflation has decreased significantly with resultant
right basilar atelectasis and vascular crowding at the hila.
Retrocardiac opacification persists, atelectasis or infiltrate
within this region. Cardiac size is mildly enlarged. No
pneumothorax. Small left pleural effusion is difficult to exclude.
IMPRESSION: Interval extubation with marked interval decrease in pulmonary
insufflation.

Persistent retrocardiac opacification compatible with atelectasis or
infiltrate within this region.

## 2023-01-22 IMAGING — CT CT HEAD W/O CM
4 series · 15 of 47 positions shown, 17 images · non-contrast
Comparison: 08/30/2020

CLINICAL DATA: Change in mental status

EXAM:
CT HEAD WITHOUT CONTRAST
TECHNIQUE: Contiguous axial images were obtained from the base of the skull
through the vertex without intravenous contrast.

[Series 3: head without · axial · non-contrast · 0.49mm/px · z∈[-71,+54]mm · 7 of 35 slices shown, 9 images]
[im 5/35  brain]
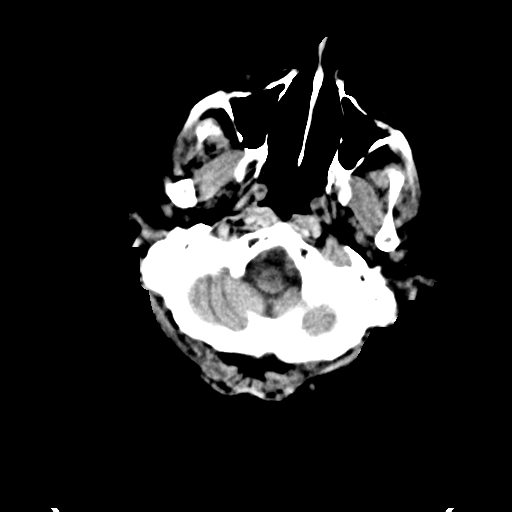
[im 5/35  bone]
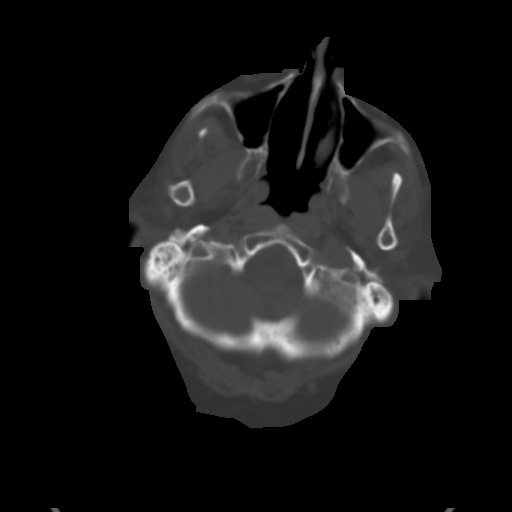
[im 9/35  brain]
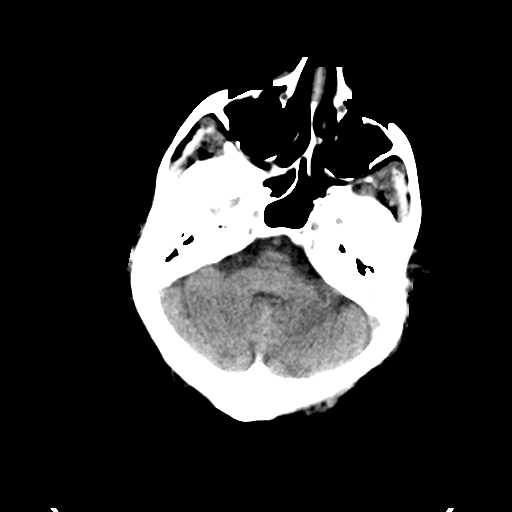
[im 13/35  brain]
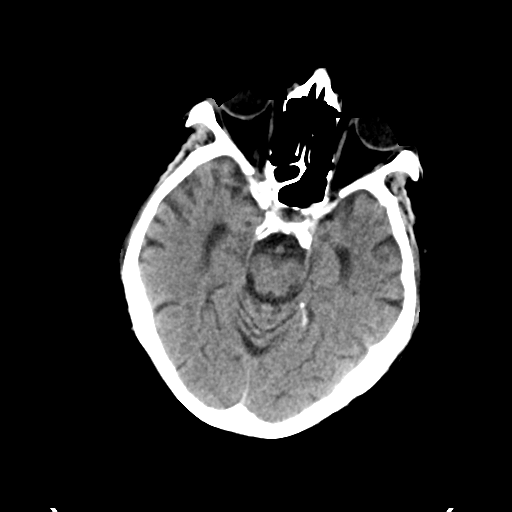
[im 18/35  brain]
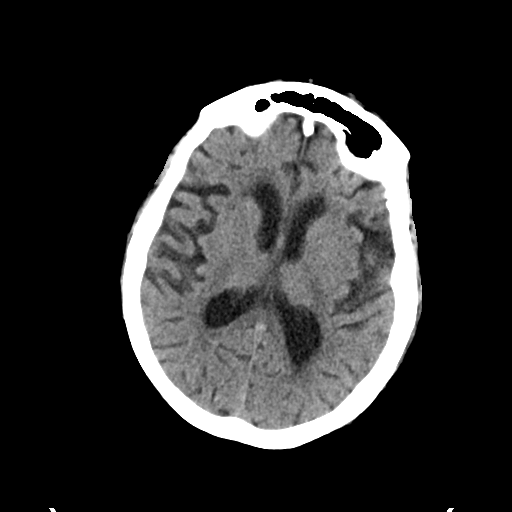
[im 22/35  brain]
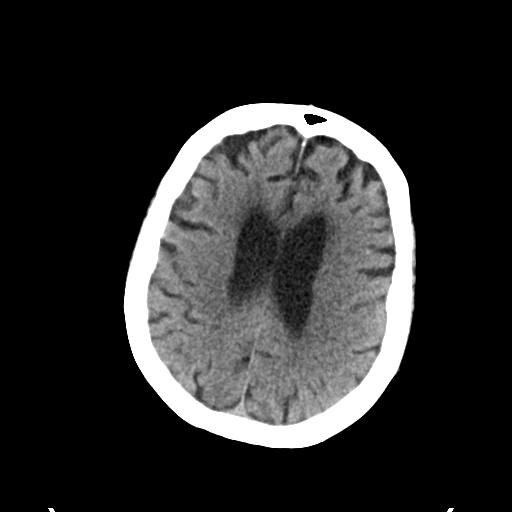
[im 22/35  bone]
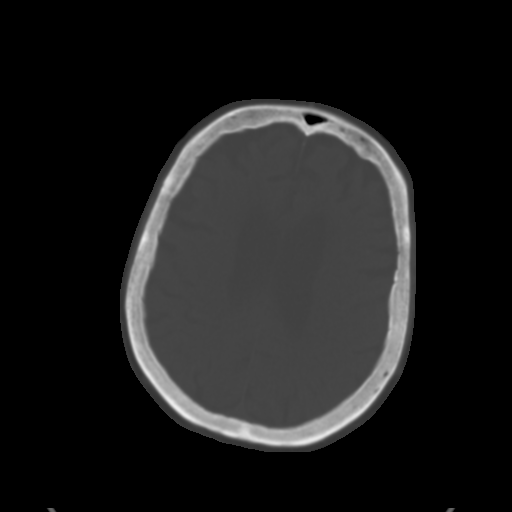
[im 26/35  brain]
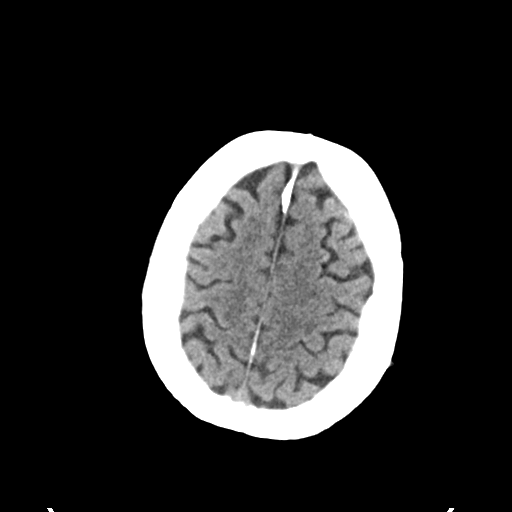
[im 30/35  brain]
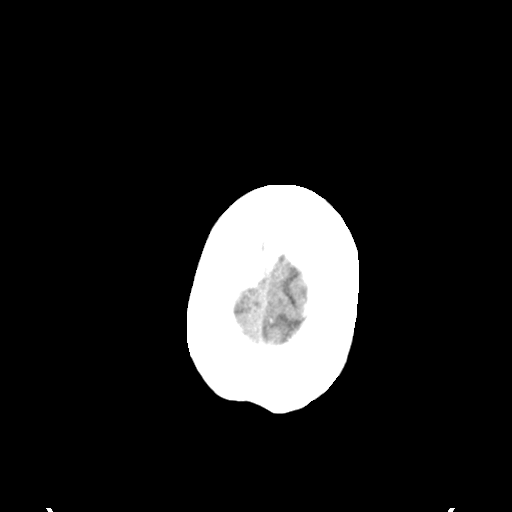

[Series 4: head bone · axial · 0.49mm/px · z∈[-75,-57]mm · 2 of 86 slices shown]
[im 9/86  bone]
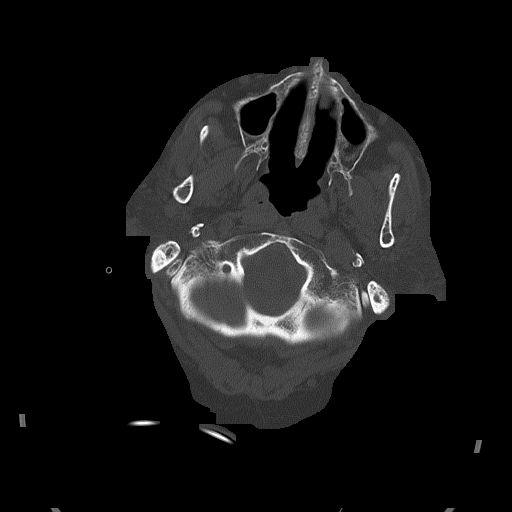
[im 18/86  bone]
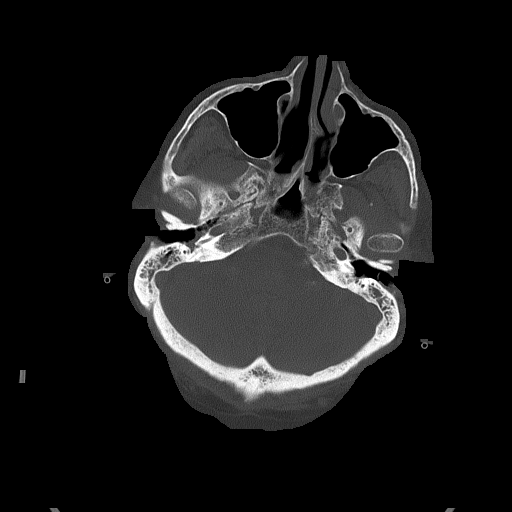

[Series 5: head without cor · coronal · non-contrast · 0.34mm/px · 3 of 67 slices shown]
[im 23/67  brain]
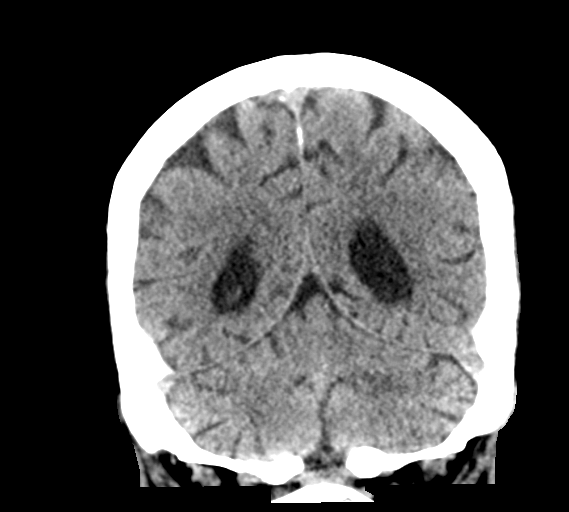
[im 30/67  brain]
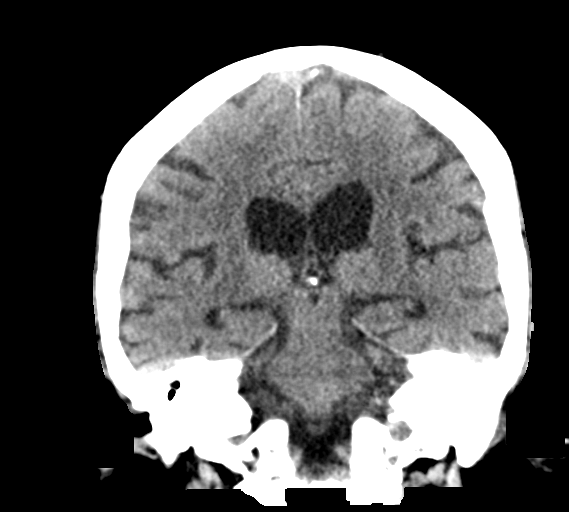
[im 37/67  brain]
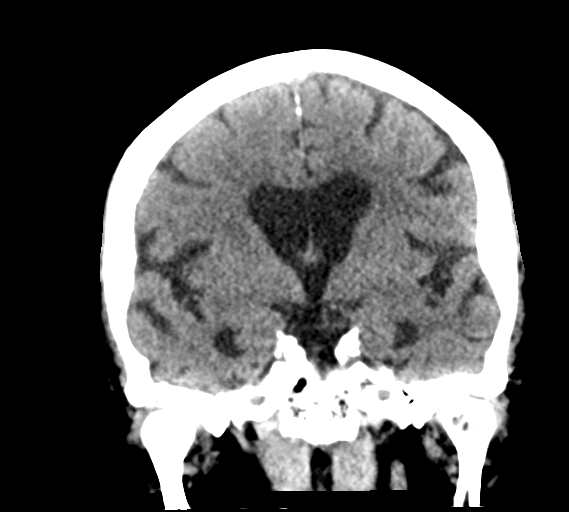

[Series 6: head without sag · sagittal · non-contrast · 0.34mm/px · 3 of 67 slices shown]
[im 23/67  brain]
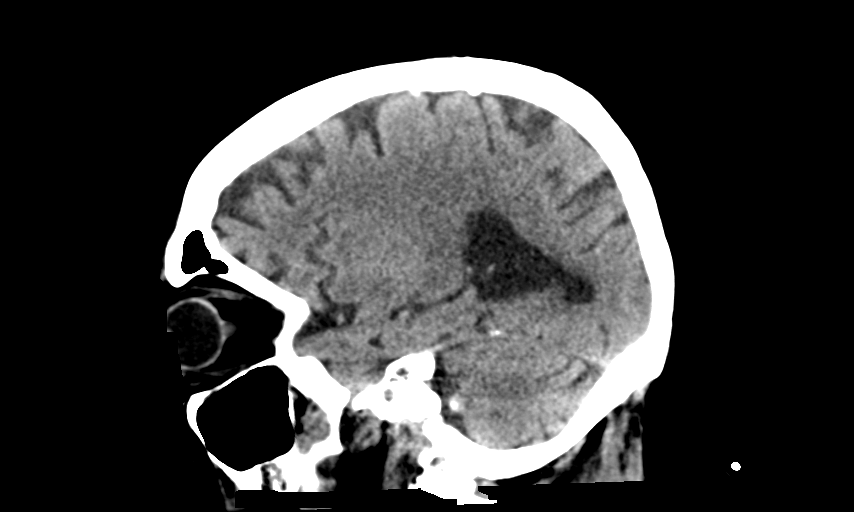
[im 34/67  brain]
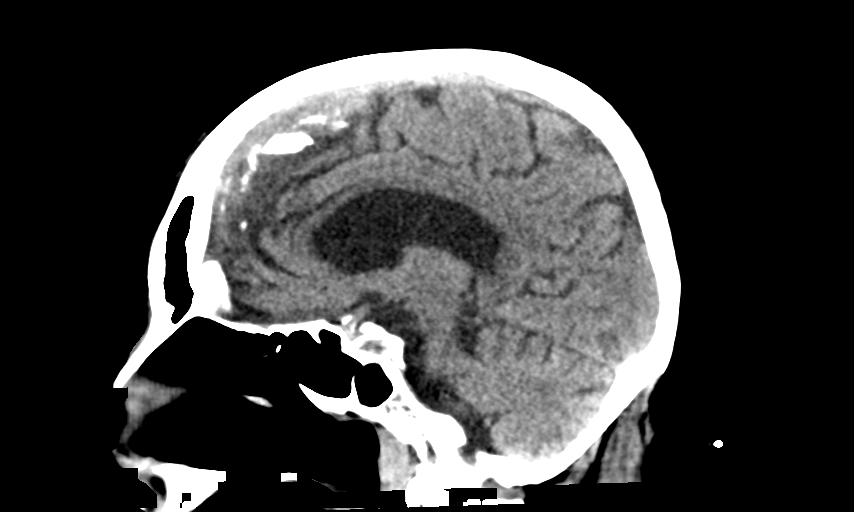
[im 45/67  brain]
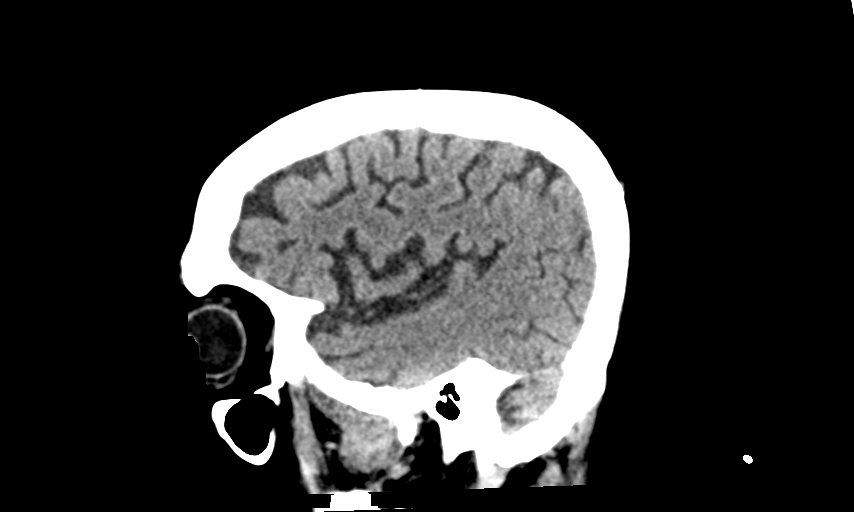

[15 of 47 positions shown; findings below may reference images not displayed]

FINDINGS: Brain: There is no acute intracranial hemorrhage, mass effect, or
edema. Gray-white differentiation is preserved. There is no
extra-axial fluid collection. Prominence of the ventricles and sulci
reflects stable parenchymal volume loss. Patchy hypoattenuation in
the supratentorial white matter likely reflects stable chronic
microvascular ischemic changes.

Vascular: There is atherosclerotic calcification at the skull base.

Skull: Calvarium is unremarkable.

Sinuses/Orbits: No acute finding.

Other: Bilateral mastoid effusions.
IMPRESSION: No acute intracranial abnormality. No significant change since
08/30/2020.

## 2023-01-24 IMAGING — DX DG CHEST 1V PORT
2 series · 2 of 2 positions shown · non-contrast
Comparison: 09/05/2020.

CLINICAL DATA: Pneumonia.

EXAM:
PORTABLE CHEST 1 VIEW

[chest ap (1 of 2)]
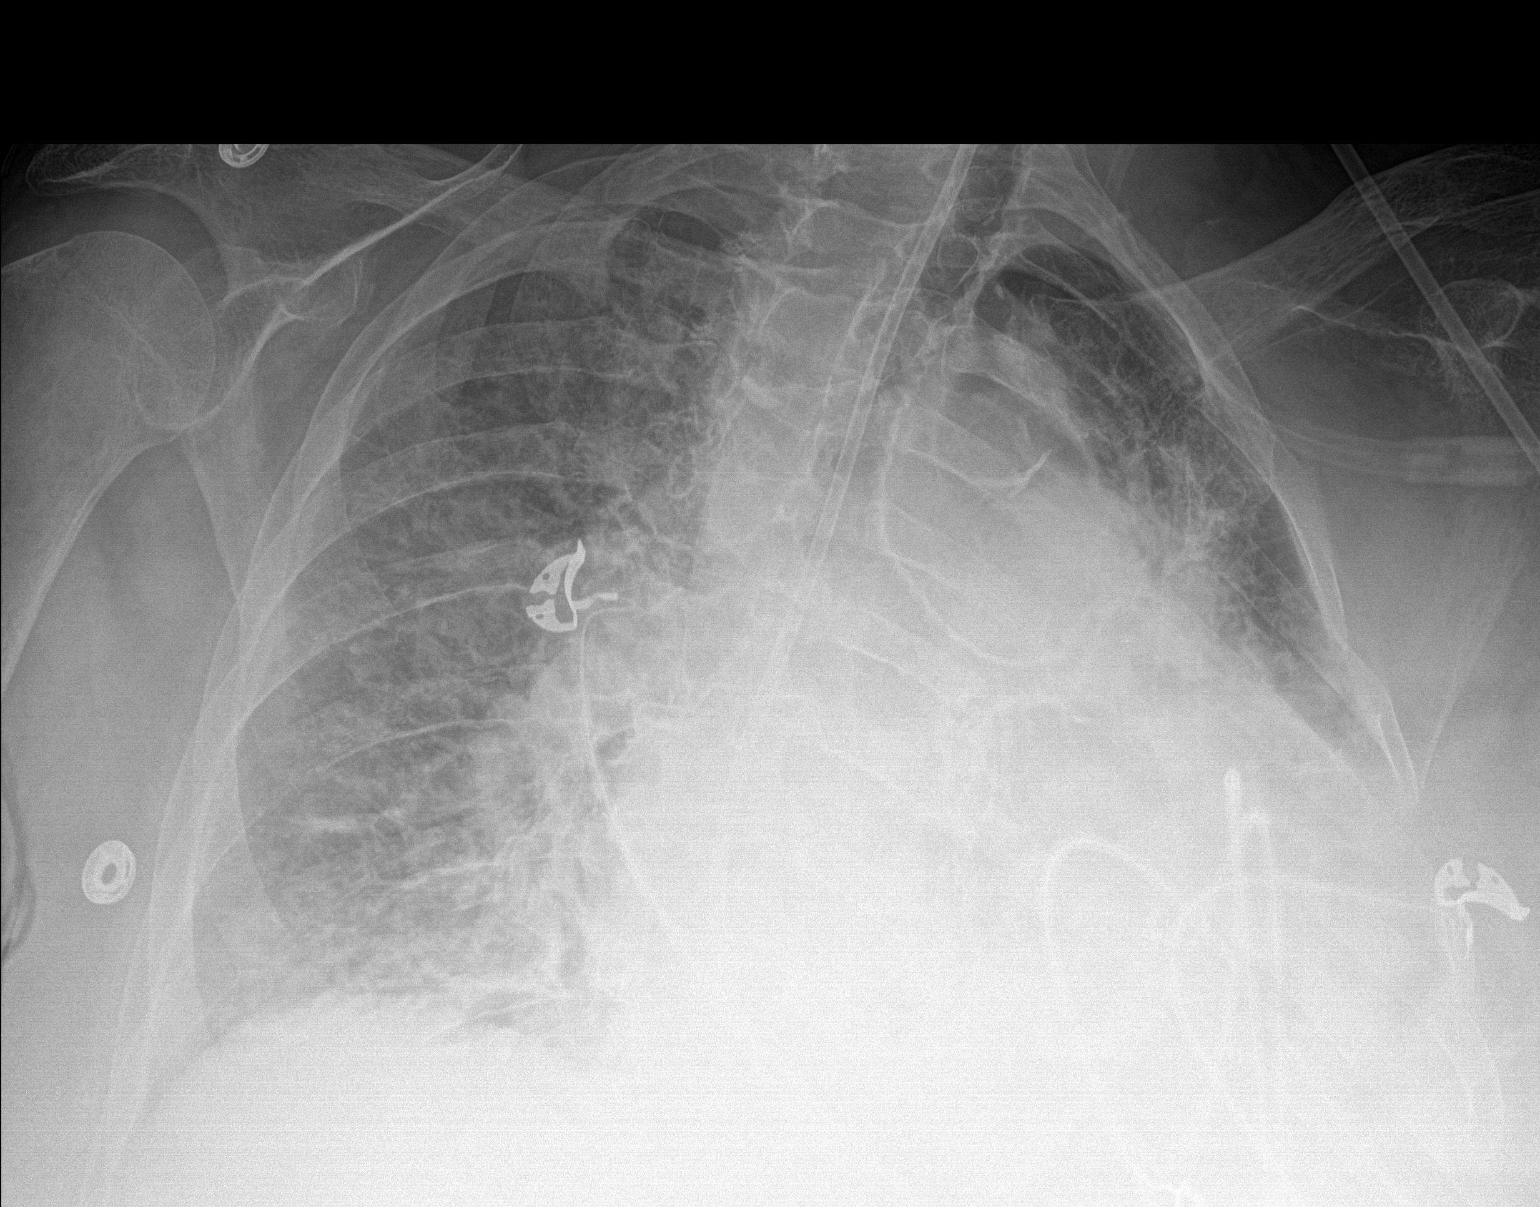

[chest ap (2 of 2)]
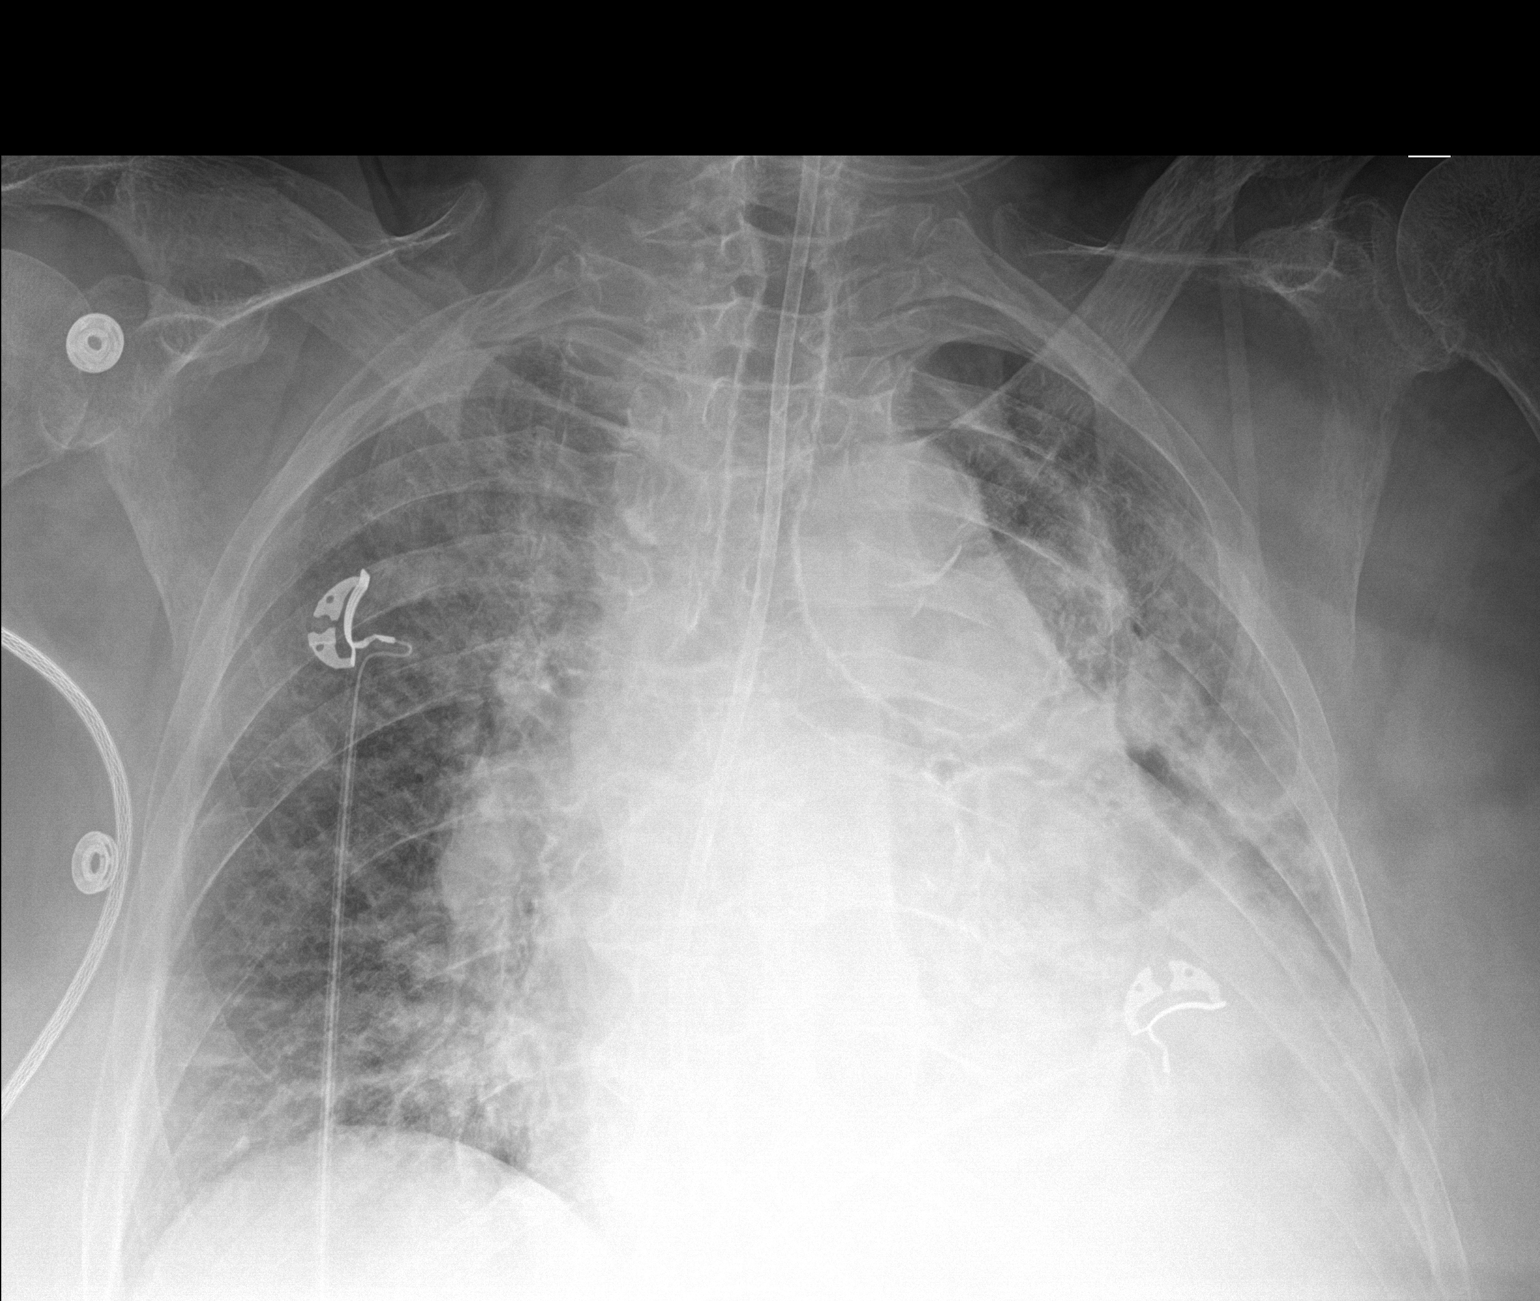

[2 of 2 positions shown; findings below may reference images not displayed]

FINDINGS: Interval removal of right IJ line. Interval placement of feeding
tube with tip below hemidiaphragm. Cardiomegaly. Diffuse bilateral
pulmonary infiltrates/edema again noted. No interim change. Low lung
volumes with bibasilar atelectasis. Small left pleural effusion
cannot be excluded. No pneumothorax.
IMPRESSION: 1. Interval removal of right IJ line. Interval placement of feeding
tube with tip below hemidiaphragm.
2. Cardiomegaly with diffuse bilateral pulmonary infiltrates/edema
again noted. No interim change. Low lung volumes with bibasilar
atelectasis again noted without interim change. Small left pleural
effusion cannot be excluded.

## 2023-01-24 IMAGING — MR MR HEAD W/O CM
7 of 11 series · 25 of 48 positions shown · non-contrast
Comparison: 08/28/2020

CLINICAL DATA: Mental status change

EXAM:
MRI HEAD WITHOUT CONTRAST
TECHNIQUE: Multiplanar, multiecho pulse sequences of the brain and surrounding
structures were obtained without intravenous contrast.

[Series 2: DWI · axial · 3.0mm · 0.94mm/px · z∈[-113,+34]mm · 7 of 104 slices shown (1 of 2)]
[im 1/104]
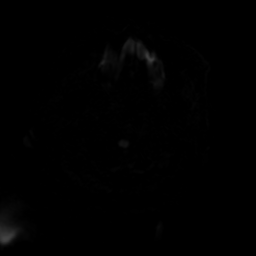
[im 18/104]
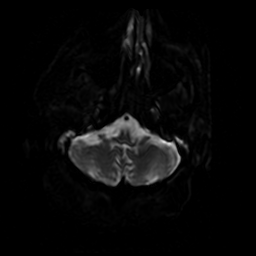
[im 35/104]
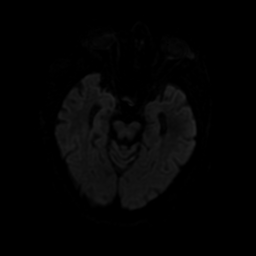
[im 52/104]
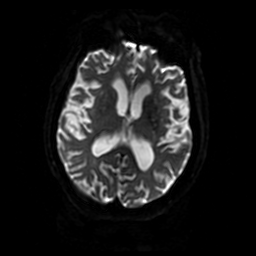
[im 69/104]
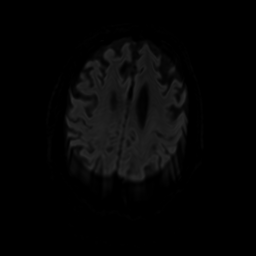
[im 86/104]
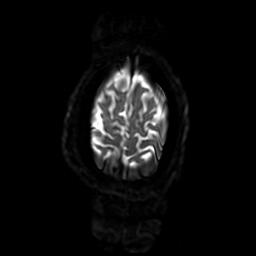
[im 104/104]
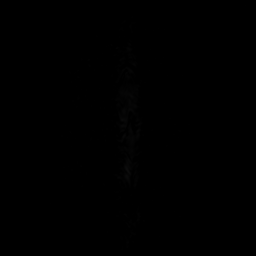

[Series 3: DWI · coronal · 4.0mm · 0.94mm/px · 6 of 74 slices shown (2 of 2)]
[im 1/74]
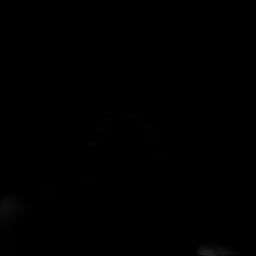
[im 15/74]
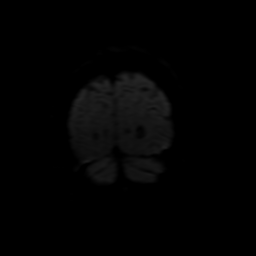
[im 30/74]
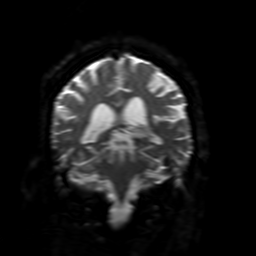
[im 44/74]
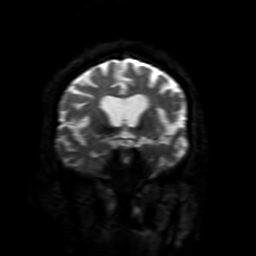
[im 59/74]
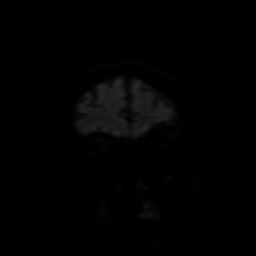
[im 74/74]
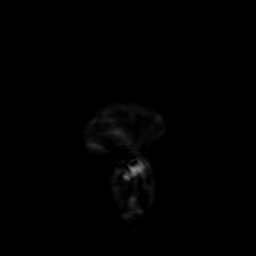

[Series 4: FLAIR · sagittal · 5.0mm · 0.23mm/px · 2 of 27 slices shown (1 of 2)]
[im 1/27]
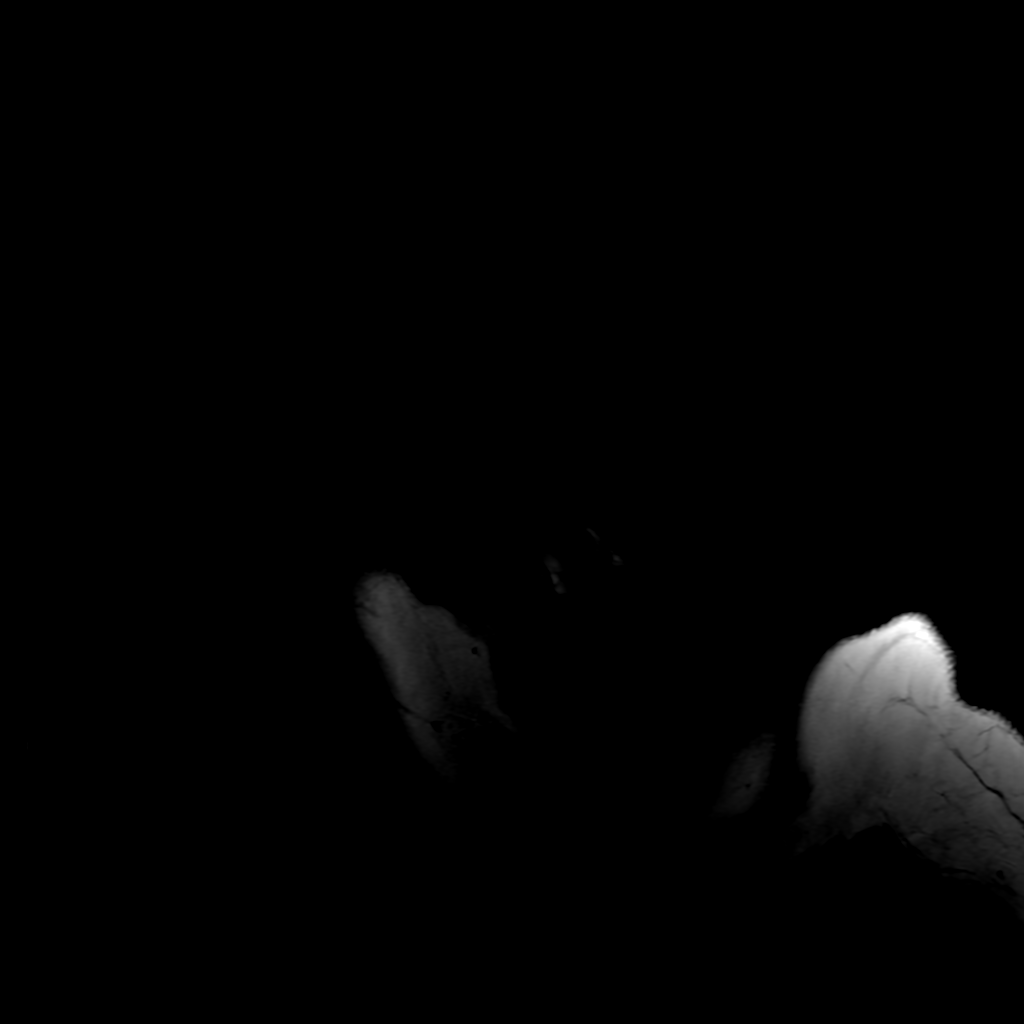
[im 27/27]
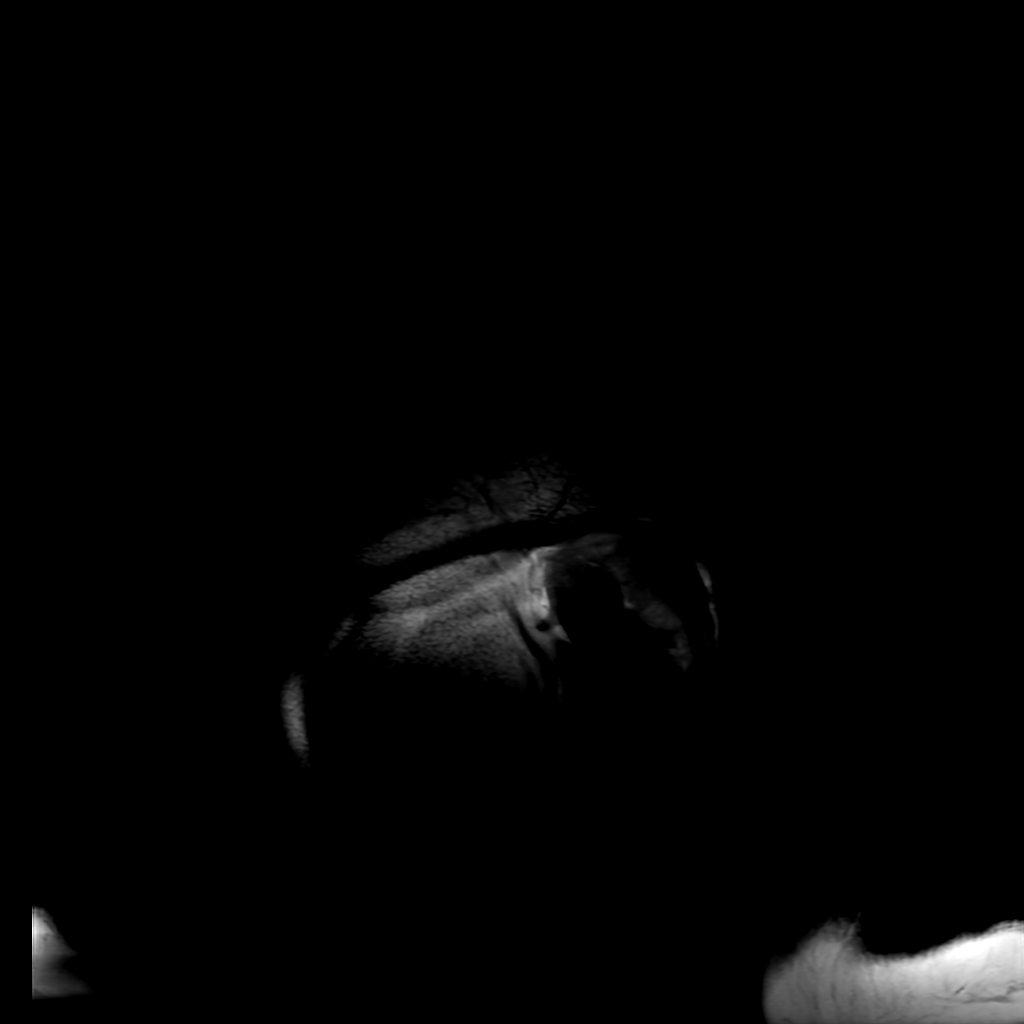

[Series 5: T2 · axial · 5.0mm · 0.23mm/px · 1 of 26 slices shown]
[im 1/26]
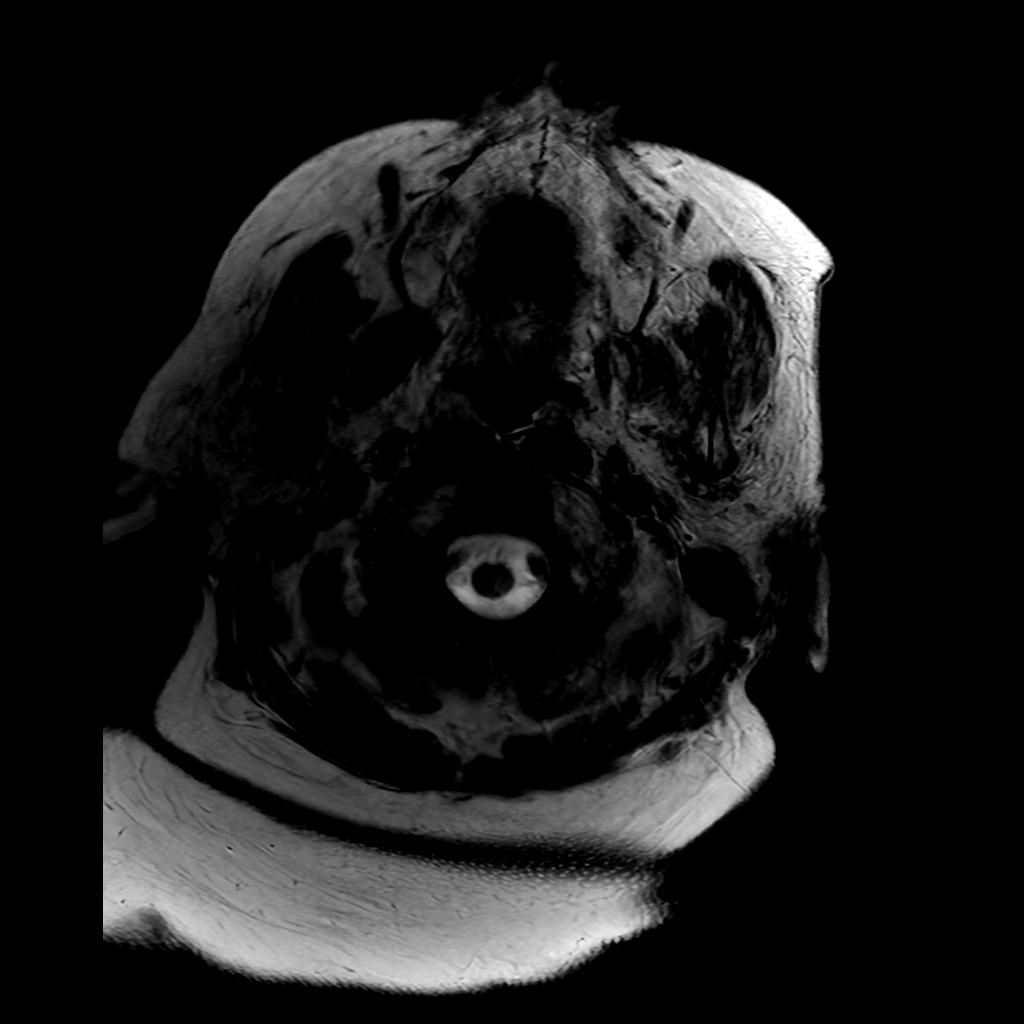

[Series 6: FLAIR · axial · 3.0mm · 0.45mm/px · z∈[-112,+32]mm · 2 of 26 slices shown (2 of 2)]
[im 1/26]
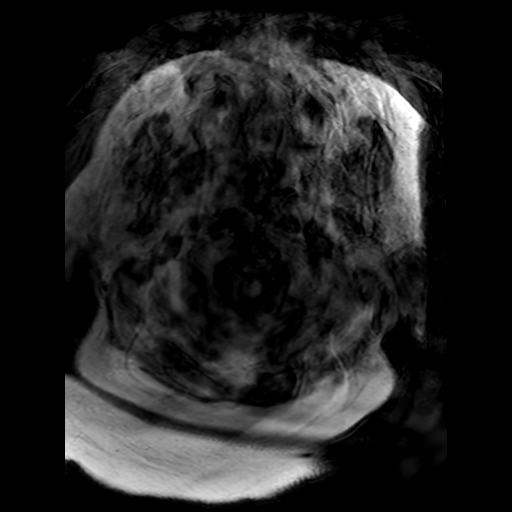
[im 26/26]
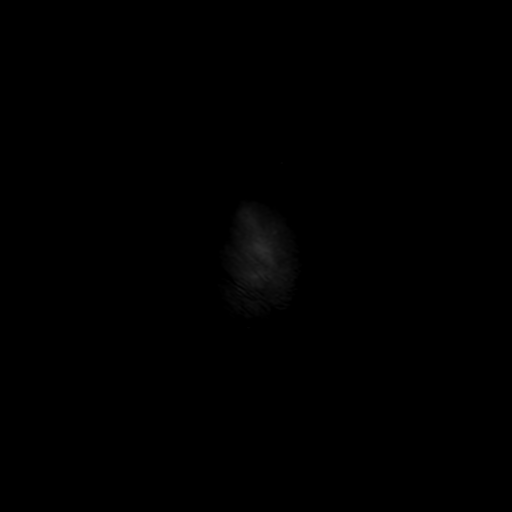

[Series 250: ADC · axial · 3.0mm · 0.94mm/px · z∈[-113,+34]mm · 4 of 52 slices shown (1 of 2)]
[im 1/52]
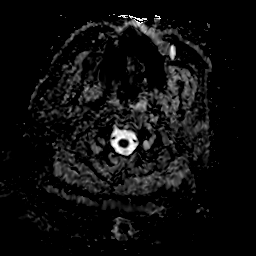
[im 18/52]
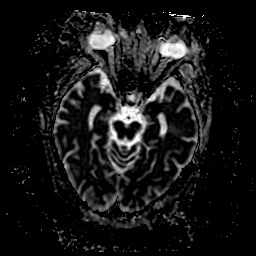
[im 35/52]
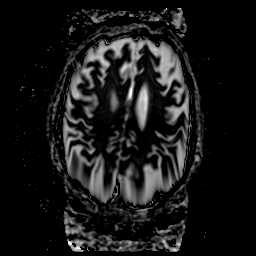
[im 52/52]
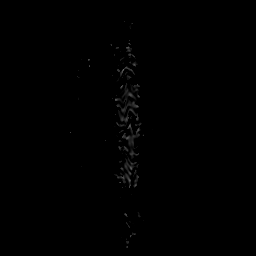

[Series 350: ADC · coronal · 4.0mm · 0.94mm/px · 3 of 37 slices shown (2 of 2)]
[im 1/37]
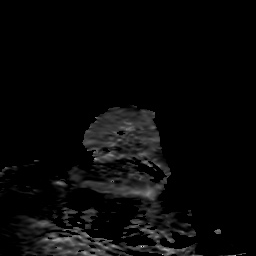
[im 19/37]
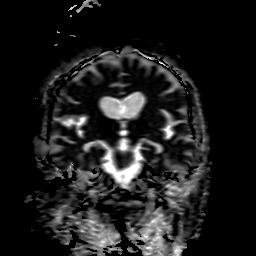
[im 37/37]
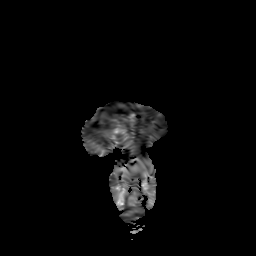

[25 of 48 positions shown; findings below may reference images not displayed]

FINDINGS: Motion artifact is present.

Brain: Punctate focus of diffusion hyperintensity on the prior study
is no longer present. There is no new reduced diffusion identified.
No evidence of intracranial hemorrhage. There is no intracranial
mass, mass effect, or edema. There is no hydrocephalus or
extra-axial fluid collection. Prominence of the ventricles and sulci
reflects stable parenchymal volume loss. Patchy T2 hyperintensity in
the supratentorial white matter is nonspecific but probably reflects
stable chronic microvascular ischemic changes.

Vascular: Major vessel flow voids at the skull base are preserved.

Skull and upper cervical spine: Normal marrow signal is preserved.

Sinuses/Orbits: Minor paranasal sinus mucosal thickening. Orbits are
unremarkable.

Other: Sella is unremarkable.  Bilateral mastoid effusions.
IMPRESSION: Motion degradation.  No acute infarction, hemorrhage, or mass.

## 2023-01-26 IMAGING — DX DG CHEST 1V PORT
1 series · 1 of 1 positions shown · non-contrast
Comparison: September 08, 2020

CLINICAL DATA: Respiratory failure

EXAM:
PORTABLE CHEST 1 VIEW

[chest ap]
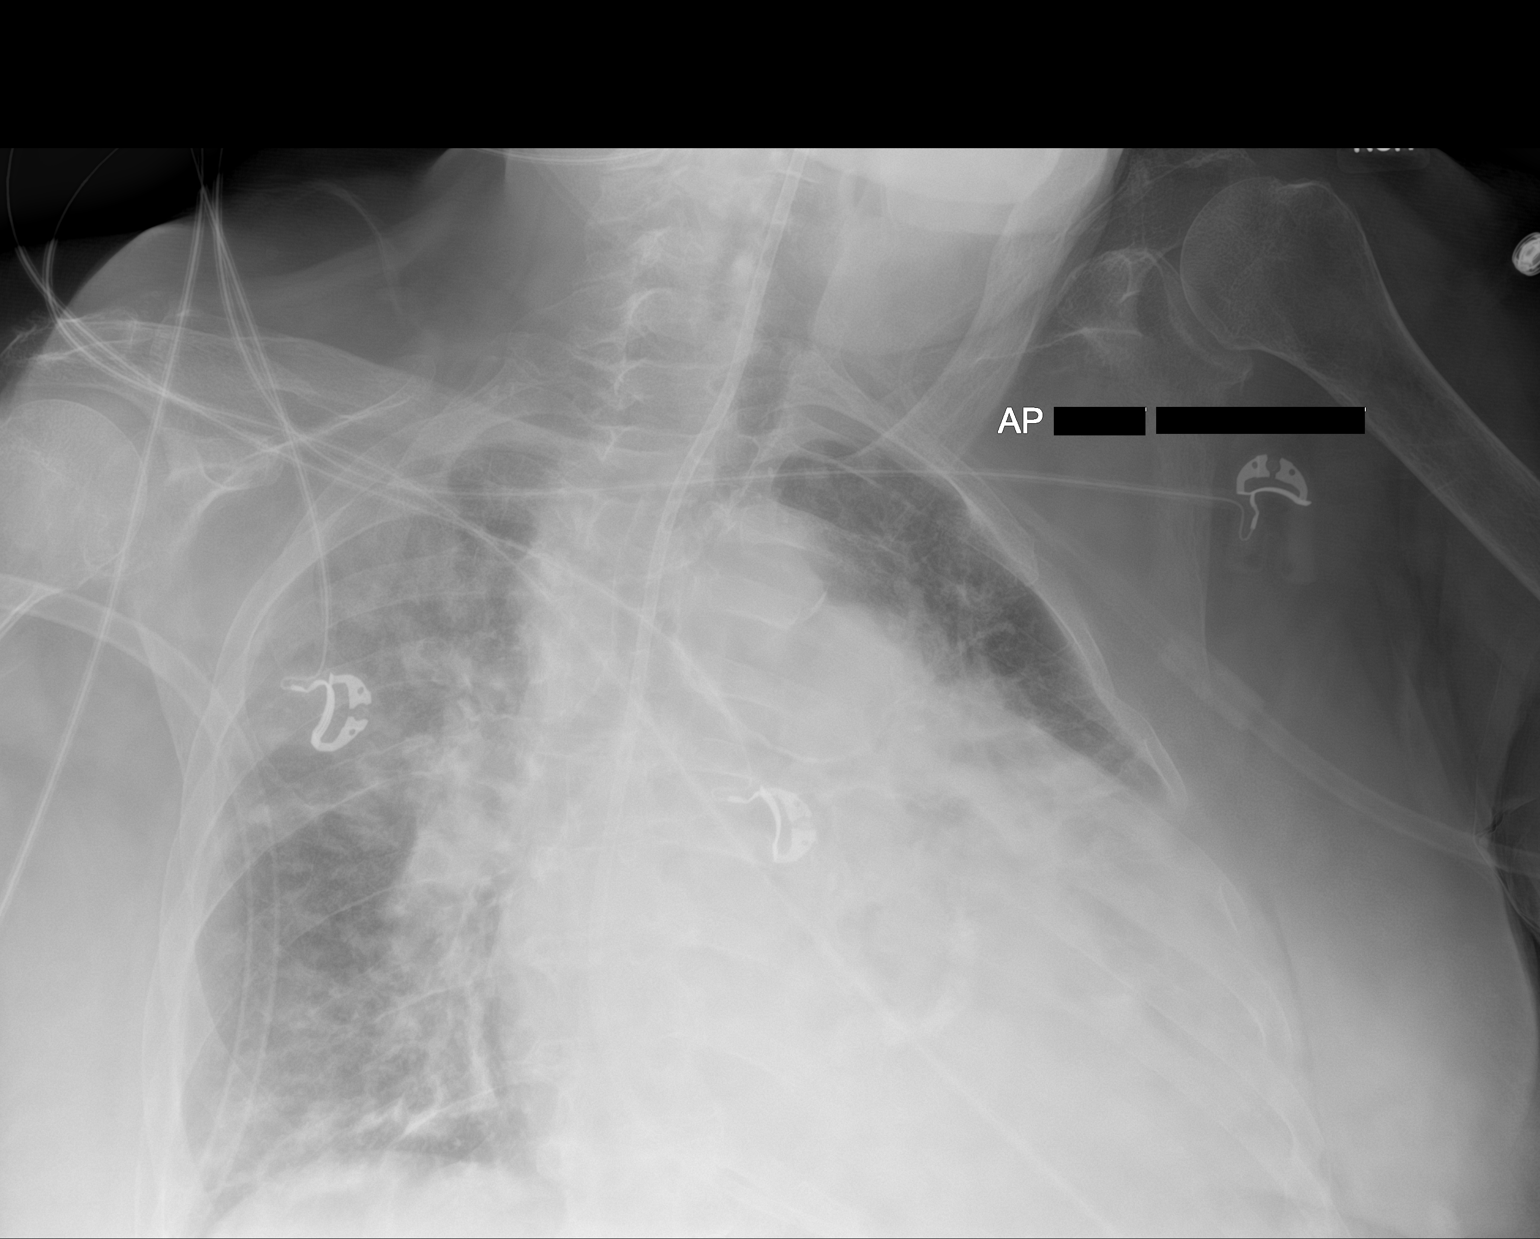

[1 of 1 positions shown; findings below may reference images not displayed]

FINDINGS: Enteric tube tip is below the diaphragm. No pneumothorax. There is
airspace opacity in the right upper lobe and left lower lobe
regions, most likely representing multifocal pneumonia. Small left
pleural effusion. There is a degree of interstitial edema as well.
There is cardiomegaly with pulmonary venous hypertension. No
adenopathy. There is aortic atherosclerosis. There is mitral annulus
calcification. No bone lesions.
IMPRESSION: Enteric tube as described. There is airspace opacity in the right
upper lobe and left base regions which are suspicious for multifocal
pneumonia. Pulmonary edema could present in this manner and is a
differential consideration for these airspace opacities. Elsewhere,
there is cardiomegaly with pulmonary venous hypertension and mild
interstitial edema. Small left pleural effusion. Suspect a degree of
underlying congestive heart failure. Aortic Atherosclerosis
([id number]).

## 2023-02-04 ENCOUNTER — Encounter: Payer: Self-pay | Admitting: Family Medicine

## 2023-02-05 IMAGING — DX DG CHEST 1V PORT
1 series · 1 of 1 positions shown · non-contrast
Comparison: Prior chest radiograph 09/11/2020 and earlier.

CLINICAL DATA: Shortness of breath.

EXAM:
PORTABLE CHEST 1 VIEW

[chest ap]
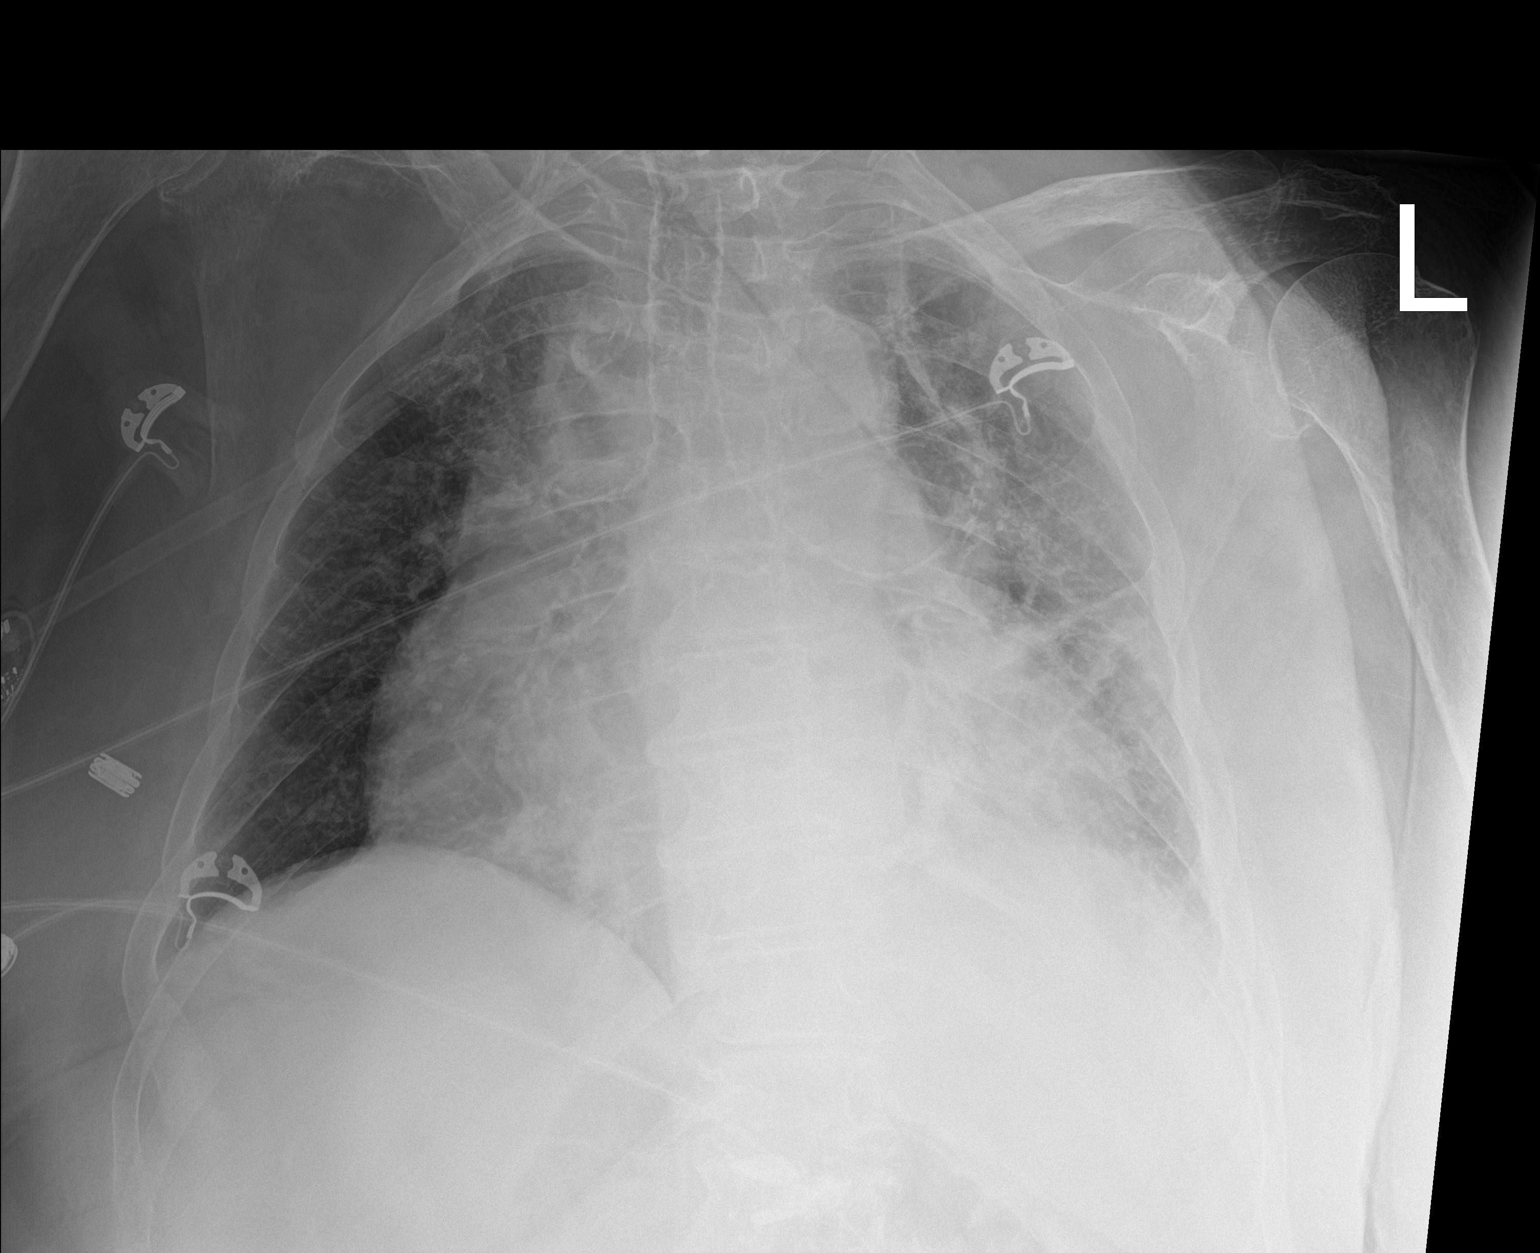

[1 of 1 positions shown; findings below may reference images not displayed]

FINDINGS: Unchanged cardiomegaly. Aeration of the right lung has significantly
improved as compared to the prior examination of 09/11/2020 with
persistent prominence of the interstitial markings within the right
lung and persistent airspace opacities within the medial right lung
base. Persistent interstitial prominence and patchy airspace
opacities throughout much of the left lung. No sizable pleural
effusion. No evidence of pneumothorax
IMPRESSION: Unchanged cardiomegaly.

Improved aeration of the right lung as compared to 09/11/2020.
Persistent prominence of the interstitial lung markings bilaterally
with persistent airspace opacities within the medial right lung base
and scattered throughout much of the left lung. Findings likely
reflect pulmonary edema, possibly with superimposed multifocal
pneumonia.

## 2023-02-09 ENCOUNTER — Other Ambulatory Visit: Payer: Self-pay | Admitting: Family Medicine

## 2023-02-10 ENCOUNTER — Ambulatory Visit (HOSPITAL_COMMUNITY)
Admission: RE | Admit: 2023-02-10 | Discharge: 2023-02-10 | Disposition: A | Discharge status: Home / Self Care | Payer: PPO | Source: Ambulatory Visit | Attending: Acute Care | Admitting: Acute Care

## 2023-02-10 DIAGNOSIS — G4733 Obstructive sleep apnea (adult) (pediatric): Secondary | ICD-10-CM | POA: Diagnosis not present

## 2023-02-10 DIAGNOSIS — Z9911 Dependence on respirator [ventilator] status: Secondary | ICD-10-CM

## 2023-02-10 DIAGNOSIS — J386 Stenosis of larynx: Secondary | ICD-10-CM | POA: Diagnosis present

## 2023-02-10 DIAGNOSIS — R491 Aphonia: Secondary | ICD-10-CM | POA: Diagnosis not present

## 2023-02-10 DIAGNOSIS — Z6841 Body Mass Index (BMI) 40.0 and over, adult: Secondary | ICD-10-CM

## 2023-02-10 DIAGNOSIS — Z930 Tracheostomy status: Secondary | ICD-10-CM | POA: Insufficient documentation

## 2023-02-10 DIAGNOSIS — J9611 Chronic respiratory failure with hypoxia: Secondary | ICD-10-CM | POA: Diagnosis present

## 2023-02-10 DIAGNOSIS — E662 Morbid (severe) obesity with alveolar hypoventilation: Secondary | ICD-10-CM | POA: Diagnosis present

## 2023-02-10 DIAGNOSIS — R609 Edema, unspecified: Secondary | ICD-10-CM | POA: Diagnosis not present

## 2023-02-10 NOTE — Progress Notes (Signed)
Tracheostomy Procedure Note  MELVIS PEDLEY [id number] October 29, 1950  Pre Procedure Tracheostomy Information  Trach Brand: Shiley Size:  6.0 [id number] Style: Cuffed Secured by: Velcro Lidocaine neb given prior to trach change  Procedure: Trach Cleaned and [employer]    Post Procedure Tracheostomy Information  Trach Brand: Shiley Size:  6.0 [id number] Style: Cuffed Secured by: Velcro   Post Procedure Evaluation:  ETCO2 positive color change from yellow to purple : Yes.   Vital signs:VSS Patients current condition: stable Complications: No apparent complications Trach site exam: clean, dry Wound care done: 4 x 4 gauze drain gauze Patient did tolerate procedure well.   Education: none  Prescription needs: none    Additional needs: none

## 2023-02-10 NOTE — Progress Notes (Signed)
Reason for visit Planned trach change  HPI 71 year old pt well known to me. Follow her for trach dependence for OHS,OSA on nocturnal ventilation. I last saw her Aug 2023. She has a more complicated trach hx for on-going aphonia for which I referred to her [facility].   Recent ENT Hx -Referred to ENT May 2023 for subglottis stenosis. subglottic mass removed  -direct laryngoscopy and airway evaluation in September 2023.  Large subglottic posteriorly-based granuloma which was eminating superiorly through the glottis, biopsied and send for permanent pathology; thick circumferential subglottic granulations tissue, which was removed with cupped forceps and coblator  Much improved subglottic airway following the procedure F/u 11/8: had been doing better initially post-op.  he visualized subglottis and proximal trachea are patent and the Shiley tracheostomy tube is visible in the subglottis the trachea is edematous and there does not appear to be much room around the tracheostomy device they recommended fenestrated trach which we placed.  This was subsequently discontinued as the patient did not tolerate it.  Presents today for planned trach change   Review of Systems  Constitutional:  Negative for chills and fever.  HENT: Negative.    Eyes: Negative.   Respiratory: Negative.    Cardiovascular: Negative.   Gastrointestinal: Negative.   Genitourinary:  Positive for dysuria.       Just started on rx for UTI   Skin: Negative.   Endo/Heme/Allergies: Negative.     Exam General 72 year old female well known to me. No distress HENT # 6 cuffed trach unremarkable. Still has very hoarse and weak phonation w/ PMV in place Pulm clear  Card rrr Abd soft Ext warm and dry  Neuro intact   Procedure Trach change The patient was premedicated with inhaled lidocaine, following this the old tracheostomy was removed, tracheostomy site was inspected and was unremarkable.  A new tracheostomy was placed over  obturator, placement was checked via end-tidal CO2 patient tolerated well    Principal Problem:   Tracheostomy status (HCC) Active Problems:   Subglottic stenosis   OSA (obstructive sleep apnea)   Chronic respiratory failure with hypoxia and hypercapnia (HCC)   Dependence on home ventilator (HCC)   Obesity hypoventilation syndrome (HCC)   Body mass index (BMI) 40.0-44.9, adult (HCC)       Tracheostomy status (HCC) Overview:  Trach Brand: Shiley Size:  6.0 [id number] Style: Cuffed Secured by: Velcro  Last change 7/3 (prior); most recent **  Discussion Stable tracheostomy.  Not a candidate for decannulation due to subglottic stenosis.   Plan Continue routine trach care ROV 4 weeks for planned tracheostomy change Continue nocturnal ventilation        My time 20 min   Simonne Martinet ACNP-BC [facility] Pulmonary/Critical Care Pager # [phone] OR # [phone] if no answer

## 2023-02-14 ENCOUNTER — Ambulatory Visit: Payer: PPO | Attending: Cardiovascular Disease | Admitting: Cardiovascular Disease

## 2023-02-14 VITALS — BP 118/74 | HR 60 | Ht 65.0 in | Wt 175.4 lb

## 2023-02-14 DIAGNOSIS — I872 Venous insufficiency (chronic) (peripheral): Secondary | ICD-10-CM | POA: Diagnosis not present

## 2023-02-14 DIAGNOSIS — I482 Chronic atrial fibrillation, unspecified: Secondary | ICD-10-CM | POA: Diagnosis not present

## 2023-02-14 DIAGNOSIS — M79605 Pain in left leg: Secondary | ICD-10-CM

## 2023-02-14 DIAGNOSIS — M79604 Pain in right leg: Secondary | ICD-10-CM

## 2023-02-14 DIAGNOSIS — I5022 Chronic systolic (congestive) heart failure: Secondary | ICD-10-CM

## 2023-02-14 NOTE — Patient Instructions (Signed)
Medication Instructions:  No changes *If you need a refill on your cardiac medications before your next appointment, please call your pharmacy*   Lab Work: None ordered If you have labs (blood work) drawn today and your tests are completely normal, you will receive your results only by: MyChart Message (if you have MyChart) OR A paper copy in the mail If you have any lab test that is abnormal or we need to change your treatment, we will call you to review the results.   Testing/Procedures: Your physician has requested that you have a lower extremity segmental doppler. This will take place at [address] [facility], [address].    Follow-Up: At [facility], you and your health needs are our priority.  As part of our continuing mission to provide you with exceptional heart care, we have created designated Provider Care Teams.  These Care Teams include your primary Cardiologist (physician) and Advanced Practice Providers (APPs -  Physician Assistants and Nurse Practitioners) who all work together to provide you with the care you need, when you need it.  We recommend signing up for the patient portal called "MyChart".  Sign up information is provided on this After Visit Summary.  MyChart is used to connect with patients for Virtual Visits (Telemedicine).  Patients are able to view lab/test results, encounter notes, upcoming appointments, etc.  Non-urgent messages can be sent to your provider as well.   To learn more about what you can do with MyChart, go to [email or web address].    Your next appointment:   6 month(s)  Dr. Kirke Corin

## 2023-02-14 NOTE — Progress Notes (Signed)
Cardiology Office Note   Date:  02/14/2023   ID:  Maycen, Felt [birth year], MRN [id number]  PCP:  Rica Records, FNP  Cardiologist: Dr. Dina Rich  No chief complaint on file.     History of Present Illness: [name] is a 72 y.o. female who was referred for evaluation of possible peripheral arterial disease. She has known history of chronic systolic heart failure with pulmonary hypertension, atrial fibrillation on Eliquis anticoagulation, chronic respiratory failure with tracheostomy, diabetes mellitus, previous tobacco use, essential hypertension and hyperlipidemia. She is here for evaluation of bluish discoloration in both feet with swelling that is worse at the end of the day.  She also complains of cold sensation in both feet.  She has occasional discomfort in her calves with walking but not on a consistent basis.  She had lower extremity venous Doppler done last year that showed no evidence of DVT.  She denies chest pain or worsening dyspnea.   Past Medical History:  Diagnosis Date   Abnormal pulmonary function test    Anemia    H/H of 10/30 with a normal MCV in 12/09   Anxiety    Arthritis    Barrett's esophagus    Diagnosed 1995. Last EGD 2016-NO BARRETT'S.    Chest pain    a. 2022 Cath: nl cors; b. 2008 Neg myoview; c. 01/2014 Cath: Nl cors. EF 40%; c. 08/2020 NSTEMI/Cath [facility]): nl cors.   Chronic anticoagulation    Chronic combined systolic and diastolic CHF (congestive heart failure) (HCC)    a. 10/2014 Echo: EF40%; b. 01/2015 Echo: EF 55-60%; c. 11/2020 Echo: EF 55-60%, mod LVH, sev BAE, mild MR, mod TR. PASP ; d. 01/2021 Echo: EF 45-50%, glob HK, mod conc LVH, mod reduced RV fxn, RVSP 60.35mmHg, Mod TR, mild to mod MR, sev dil LA.   Chronic LBP    Surgical intervention in 1996   Diabetes mellitus, type 2 (HCC)    Insulin therapy; exacerbated by prednisone   Gastroparesis    99% retention 05/2008 on GES   GERD (gastroesophageal reflux  disease)    Heart attack (HCC) 08/13/2020   Hiatal hernia    Hyperlipidemia    Hypertension    Hypothyroid    IBS (irritable bowel syndrome)    Obesity    OSA on CPAP    had CPAP and cannot tolerate.   Paroxysmal atrial fibrillation (HCC)    a.CHA2DS2VASc = 6-->eliquis. Also on Amio.   Pulmonary hypertension (HCC) 01/2015   a. 01/2015 RHC: Predominantly pulmonary venous hypertension but may be component of PAH (PA mean 52, PCWP 31); b. 01/2021 Echo: RVSP 60.35mmHg w/ mod reduced RV fxn.   Seizures (HCC)    last seizure was 2 years ago; on keppra for this; unknown etiology   Syncope    a. Admitted 05/2009; magnetic resonance imagin/ MRA - negative; etiology thought to be orthostasis secondary to drugs and dehydration. b. Syncope 02/2015 also felt 2/2 dehydration.    Past Surgical History:  Procedure Laterality Date   BACK SURGERY  1996   BIOPSY N/A 11/08/2013   Procedure: BIOPSY  / Tissue sampling / ulcers present in small intestine;  Surgeon: West Bali, MD;  Location: [facility] ENDO SUITE;  Service: Endoscopy;  Laterality: N/A;   CARDIAC CATHETERIZATION  2002   CARDIAC CATHETERIZATION N/A 01/26/2015   Procedure: Right Heart Cath;  Surgeon: Laurey Morale, MD;  Location: [facility] INVASIVE CV LAB;  Service: Cardiovascular;  Laterality:  N/A;   CARDIOVASCULAR STRESS TEST  2008   Stress nuclear study   CARDIOVERSION N/A 03/06/2015   Procedure: CARDIOVERSION;  Surgeon: Laurey Morale, MD;  Location: [facility] ENDOSCOPY;  Service: Cardiovascular;  Laterality: N/A;   CARPAL TUNNEL RELEASE  1994   CATARACT EXTRACTION W/PHACO Right 01/10/2022   Procedure: CATARACT EXTRACTION PHACO AND INTRAOCULAR LENS PLACEMENT (IOC);  Surgeon: [name], MD;  Location: [facility] ORS;  Service: Ophthalmology;  Laterality: Right;  CDE: 21.51   CATARACT EXTRACTION W/PHACO Left 01/24/2022   Procedure: CATARACT EXTRACTION PHACO AND INTRAOCULAR LENS PLACEMENT (IOC);  Surgeon: [name], MD;  Location: [facility] ORS;  Service:  Ophthalmology;  Laterality: Left;  CDE: 17.13   COLONOSCOPY  11/2011   Dr. Darrick Penna: Internal hemorrhoids, mild diverticulosis. Random colon biopsies negative.   COLONOSCOPY N/A 11/08/2013   SLF: Normal mucosa in the terminal ileum/The colon IS redundant/  Moderate diverticulosis throughout the entire colon. ileum bx benign. colon bx benign   CRYOTHERAPY  01/23/2021   Procedure: CRYOTHERAPY;  Surgeon: Lorin Glass, MD;  Location: [facility] ENDOSCOPY;  Service: Pulmonary;;   CRYOTHERAPY  01/24/2021   Procedure: Cloyde Reams;  Surgeon: Lorin Glass, MD;  Location: [facility] ENDOSCOPY;  Service: Pulmonary;;   CRYOTHERAPY  01/26/2021   Procedure: Cloyde Reams;  Surgeon: Lupita Leash, MD;  Location: [facility] ENDOSCOPY;  Service: Cardiopulmonary;;   ESOPHAGOGASTRODUODENOSCOPY  2008   Barrett's without dysplasia. esphagus dilated. antral erosions, h.pylori serologies negative.   ESOPHAGOGASTRODUODENOSCOPY  11/2011   Dr. Darrick Penna: Barrett's esophagus, mild gastritis, diverticulum in the second portion of the duodenum repeat EGD 3 years. Small bowel biopsies negative. Gastric biopsy show reactive gastropathy but no H. pylori. Esophageal biopsies consistent with GERD. Next EGD 11/2014   ESOPHAGOGASTRODUODENOSCOPY N/A 11/21/2014   OZH:YQMV non-erosive gastritis/irregular z-line   ESOPHAGOGASTRODUODENOSCOPY  03/2022   FOREIGN BODY REMOVAL  01/23/2021   Procedure: FOREIGN BODY REMOVAL;  Surgeon: Lorin Glass, MD;  Location: [facility] ENDOSCOPY;  Service: Pulmonary;;   FOREIGN BODY REMOVAL  01/24/2021   Procedure: FOREIGN BODY REMOVAL;  Surgeon: Lorin Glass, MD;  Location: [facility] ENDOSCOPY;  Service: Pulmonary;;   GIVENS CAPSULE STUDY  12/07/2011   Proximal small bowel, rare AVM. Distal small bowel, multiple ulcers noted   GIVENS CAPSULE STUDY N/A 09/27/2013   Distal small bowel ulcers extending to TI.   GIVENS CAPSULE STUDY N/A 10/10/2013   Procedure: GIVENS CAPSULE STUDY;  Surgeon: West Bali, MD;  Location: [facility];  Service: Endoscopy;  Laterality: N/A;  7:30   HEMOSTASIS CONTROL  01/24/2021   Procedure: HEMOSTASIS CONTROL;  Surgeon: Lorin Glass, MD;  Location: [facility] ENDOSCOPY;  Service: Pulmonary;;   HEMOSTASIS CONTROL  01/26/2021   Procedure: HEMOSTASIS CONTROL;  Surgeon: Lupita Leash, MD;  Location: [facility] ENDOSCOPY;  Service: Cardiopulmonary;;   IR GASTROSTOMY TUBE MOD SED  01/11/2021   IR GASTROSTOMY TUBE REMOVAL  04/13/2021   IR KYPHO THORACIC WITH BONE BIOPSY  02/09/2018   KNEE ARTHROSCOPY WITH MEDIAL MENISECTOMY Right 06/09/2016   Procedure: KNEE ARTHROSCOPY WITH MEDIAL MENISECTOMY;  Surgeon: Vickki Hearing, MD;  Location: [facility] ORS;  Service: Orthopedics;  Laterality: Right;  medial and lateral menisectomy   LAMINECTOMY  1995   L4-L5   LAPAROSCOPIC CHOLECYSTECTOMY  1990s   LEFT HEART CATHETERIZATION WITH CORONARY ANGIOGRAM  01/10/2014   Procedure: LEFT HEART CATHETERIZATION WITH CORONARY ANGIOGRAM;  Surgeon: Lesleigh Noe, MD;  Location: [facility] CATH LAB;  Service: Cardiovascular;;   MICROLARYNGOSCOPY WITH LASER AND BALLOON DILATION  N/A 11/17/2021   Procedure: MICROLARYNGOSCOPY WITH CO2 LASER, SUBGLOTTIC LARYNGEAL MASS BIOSPY;  Surgeon: Serena Colonel, MD;  Location: [facility] OR;  Service: ENT;  Laterality: N/A;   RIGHT HEART CATHETERIZATION N/A 01/10/2014   Procedure: RIGHT HEART CATH;  Surgeon: Lesleigh Noe, MD;  Location: [facility] CATH LAB;  Service: Cardiovascular;  Laterality: N/A;   TOTAL ABDOMINAL HYSTERECTOMY  1999   TRACHEOSTOMY REVISION N/A 07/18/2021   Procedure: TRACHEOSTOMY REVISION;  Surgeon: Suzanna Obey, MD;  Location: [facility] OR;  Service: ENT;  Laterality: N/A;   TRACHEOSTOMY TUBE PLACEMENT N/A 09/01/2017   Procedure: TRACHEOSTOMY;  Surgeon: Drema Halon, MD;  Location: [facility] OR;  Service: ENT;  Laterality: N/A;   VIDEO BRONCHOSCOPY Bilateral 01/23/2021   Procedure: VIDEO BRONCHOSCOPY WITHOUT FLUORO;  Surgeon: Lorin Glass, MD;  Location: [facility] ENDOSCOPY;  Service:  Pulmonary;  Laterality: Bilateral;   VIDEO BRONCHOSCOPY Bilateral 01/24/2021   Procedure: VIDEO BRONCHOSCOPY WITHOUT FLUORO;  Surgeon: Lorin Glass, MD;  Location: [facility] ENDOSCOPY;  Service: Pulmonary;  Laterality: Bilateral;   VIDEO BRONCHOSCOPY N/A 01/26/2021   Procedure: VIDEO BRONCHOSCOPY WITHOUT FLUORO;  Surgeon: Lupita Leash, MD;  Location: [facility] ENDOSCOPY;  Service: Cardiopulmonary;  Laterality: N/A;     Current Outpatient Medications  Medication Sig Dispense Refill   apixaban (ELIQUIS) 5 MG TABS tablet Take 1 tablet (5 mg total) by mouth 2 (two) times daily.     atorvastatin (LIPITOR) 80 MG tablet Take 1 tablet (80 mg total) by mouth daily.     Cholecalciferol (VITAMIN D3) 50 MCG (2000 UT) capsule Take 1 capsule (2,000 Units total) by mouth daily. 90 capsule 1   Continuous Glucose Sensor (FREESTYLE LIBRE 2 SENSOR) MISC by Does not apply route.     cyanocobalamin (VITAMIN B12) 500 MCG tablet Take 1 tablet (500 mcg total) by mouth daily. 90 tablet 3   dicyclomine (BENTYL) 10 MG capsule TAKE 1 CAPSULE BY MOUTH UP TO FOUR TIMES DAILY BEFORE MEALS AND AT BEDTIME (STOP IF CONSTIPATION) 120 capsule 3   donepezil (ARICEPT) 5 MG tablet Take 1 tablet (5 mg total) by mouth at bedtime.     DULoxetine (CYMBALTA) 30 MG capsule Take 1 capsule (30 mg total) by mouth daily. (Patient taking differently: Take 30 mg by mouth 2 (two) times daily.)  3   empagliflozin (JARDIANCE) 10 MG TABS tablet Take 1 tablet (10 mg total) by mouth daily. 30 tablet 3   furosemide (LASIX) 40 MG tablet Take 1 tablet (40 mg total) by mouth daily. May take an additional 20 mg ( 0.5 tablet) as needed for swelling. 90 tablet 3   insulin glargine (LANTUS) 100 UNIT/ML injection Inject 0.15 mLs (15 Units total) into the skin daily. (Patient taking differently: Inject 20-24 Units into the skin at bedtime.) 10 mL 11   ipratropium-albuterol (DUONEB) 0.5-2.5 (3) MG/3ML SOLN Take 3 mLs by nebulization every 4 (four) hours as needed  (Asthma).     levETIRAcetam (KEPPRA) 500 MG tablet Take 500 mg by mouth 2 (two) times daily.     levothyroxine (SYNTHROID) 200 MCG tablet Take 1 tablet (200 mcg total) by mouth daily. 90 tablet 1   loperamide (IMODIUM) 2 MG capsule Take by mouth.     LORazepam (ATIVAN) 1 MG tablet Take 1 mg by mouth at bedtime.     melatonin 3 MG TABS tablet Take 1 tablet (3 mg total) by mouth at bedtime as needed.  0   metoprolol tartrate (LOPRESSOR) 25 MG tablet Take 1 tablet (25  mg total) by mouth 2 (two) times daily.     Multiple Vitamins-Minerals (MULTIVITAMIN WITH MINERALS) tablet Take 1 tablet by mouth daily. For Diabetics     nitroGLYCERIN (NITROSTAT) 0.4 MG SL tablet      NOVOLIN R FLEXPEN RELION 100 UNIT/ML FlexPen Inject 2-5 Units into the skin 3 (three) times daily before meals. Per sliding scale     pantoprazole (PROTONIX) 40 MG tablet Take 1 tablet (40 mg total) by mouth 2 (two) times daily before a meal. 60 tablet 3   potassium chloride (KLOR-CON) 10 MEQ tablet Take 20 mEq by mouth daily.     sertraline (ZOLOFT) 25 MG tablet Take 1 tablet (25 mg total) by mouth daily.     traMADol (ULTRAM) 50 MG tablet Take 50 mg by mouth 3 (three) times daily.     trolamine salicylate (ASPERCREME) 10 % cream Apply 1 application. topically as needed (knee pain).     gabapentin (NEURONTIN) 100 MG capsule Take 1 capsule (100 mg total) by mouth 3 (three) times daily. 90 capsule 3   No current facility-administered medications for this visit.    Allergies:   Celexa [citalopram hydrobromide], Codeine, Dilaudid [hydromorphone hcl], Reglan [metoclopramide], and Hyoscyamine    Social History:  The patient  reports that she quit smoking about 29 years ago. Her smoking use included cigarettes. She started smoking about 57 years ago. She has a 6.8 pack-year smoking history. She has never used smokeless tobacco. She reports that she does not currently use alcohol. She reports that she does not use drugs.   Family History:   The patient's family history includes Alzheimer's disease in her mother; Breast cancer in her sister; Heart attack in her mother; Hypertension in her mother and another family member; Stroke in her mother.    ROS:  Please see the history of present illness.   Otherwise, review of systems are positive for none.   All other systems are reviewed and negative.    PHYSICAL EXAM: VS:  BP 118/74 (BP Location: Left Arm, Patient Position: Sitting, Cuff Size: Normal)   Pulse 60   Ht 5\' 5"  (1.651 m)   Wt 175 lb 6.4 oz (79.6 kg)   SpO2 96%   BMI 29.19 kg/m  , BMI Body mass index is 29.19 kg/m. GEN: Well nourished, well developed, in no acute distress  HEENT: normal  Neck: no JVD, carotid bruits, or masses Cardiac: Irregular; no murmurs, rubs, or gallops, Respiratory:  clear to auscultation bilaterally, normal work of breathing GI: soft, nontender, nondistended, + BS MS: no deformity or atrophy  Skin: warm and dry, no rash Neuro:  Strength and sensation are intact Psych: euthymic mood, full affect Vascular: Dorsalis pedis is +2 bilaterally and posterior tibial is +1.  There is mild bilateral leg swelling with cyanosis.   EKG:  EKG is not ordered today.    Recent Labs: 12/28/2022: ALT 20; BUN 33; Creatinine, Ser 1.09; Hemoglobin 11.0; Platelets 100; Potassium 4.0; Sodium 139; TSH 5.400    Lipid Panel    Component Value Date/Time   CHOL 90 (L) 12/28/2022 1043   TRIG 78 12/28/2022 1043   HDL 43 12/28/2022 1043   CHOLHDL 2.1 12/28/2022 1043   CHOLHDL 3.9 08/29/2020 0416   VLDL 23 08/29/2020 0416   LDLCALC 31 12/28/2022 1043      Wt Readings from Last 3 Encounters:  02/14/23 175 lb 6.4 oz (79.6 kg)  01/19/23 183 lb 6.4 oz (83.2 kg)  12/27/22 178 lb (80.7 kg)  No data to display            ASSESSMENT AND PLAN:  1.  Chronic venous insufficiency: The patient's leg edema and cyanosis is likely due to chronic venous insufficiency.  No strong evidence of  peripheral arterial disease given palpable distal pulses but given her leg pain with walking, I requested lower extremity arterial Doppler. For chronic venous insufficiency, I recommend leg elevation during the day and also knee-high support stockings to be used during the day.  I gave her a prescription for this.  2.  Atrial fibrillation: Currently on anticoagulation with Eliquis.  Ventricular rate is controlled.  3.  Chronic systolic/diastolic heart failure with pulmonary hypertension, currently on furosemide 40 mg once daily.  This is likely contributing to lower extremity edema as well.  4.  Essential hypertension: Blood pressure is controlled.  Disposition:   FU with me in 6 months  Signed,  Lorine Bears, MD  02/14/2023 12:05 PM    [facility]

## 2023-02-20 ENCOUNTER — Ambulatory Visit: Payer: PPO | Admitting: Orthopedic Surgery

## 2023-02-20 ENCOUNTER — Encounter: Payer: Self-pay | Admitting: Orthopedic Surgery

## 2023-02-20 ENCOUNTER — Other Ambulatory Visit (INDEPENDENT_AMBULATORY_CARE_PROVIDER_SITE_OTHER): Payer: PPO

## 2023-02-20 VITALS — BP 118/74 | Ht 65.0 in | Wt 175.0 lb

## 2023-02-20 DIAGNOSIS — M25561 Pain in right knee: Secondary | ICD-10-CM

## 2023-02-20 DIAGNOSIS — M1120 Other chondrocalcinosis, unspecified site: Secondary | ICD-10-CM

## 2023-02-20 DIAGNOSIS — G8929 Other chronic pain: Secondary | ICD-10-CM

## 2023-02-20 DIAGNOSIS — M1711 Unilateral primary osteoarthritis, right knee: Secondary | ICD-10-CM

## 2023-02-20 MED ORDER — METHYLPREDNISOLONE ACETATE 40 MG/ML IJ SUSP
40.0000 mg | Freq: Once | INTRAMUSCULAR | Status: AC
Start: 2023-02-20 — End: 2023-02-20
  Administered 2023-02-20: 40 mg via INTRA_ARTICULAR

## 2023-02-20 NOTE — Progress Notes (Addendum)
NEW PROBLEM//OFFICE VISIT   Chief Complaint  Patient presents with   Knee Pain    Right 8-12 months    72 year old female with chronic right knee pain  She has a trach for sleep apnea details in her record  She has tried Tylenol and tramadol a knee sleeve and Aspercreme for her knee pain  She is developing deformity with more medial than lateral pain     ROS: She thought or her family thought she might be developing dementia but she was having sleep apnea and the only way they can get the oxygen 2 hours through the trach  Allergies  Allergen Reactions   Celexa [Citalopram Hydrobromide] Other (See Comments)    Dyskinesia    Codeine Nausea And Vomiting and Other (See Comments)    HALLUCINATIONS     Dilaudid [Hydromorphone Hcl] Other (See Comments)    Made her pass out    Reglan [Metoclopramide] Other (See Comments)    DYSKINESIA    Hyoscyamine Other (See Comments)    MADE DIARRHEA WORSE    Current Outpatient Medications  Medication Instructions   apixaban (ELIQUIS) 5 mg, Oral, 2 times daily   atorvastatin (LIPITOR) 80 mg, Oral, Daily   Continuous Glucose Sensor (FREESTYLE LIBRE 2 SENSOR) MISC Does not apply   cyanocobalamin (VITAMIN B12) 500 mcg, Oral, Daily   dicyclomine (BENTYL) 10 MG capsule TAKE 1 CAPSULE BY MOUTH UP TO FOUR TIMES DAILY BEFORE MEALS AND AT BEDTIME (STOP IF CONSTIPATION)   donepezil (ARICEPT) 5 mg, Oral, Daily at bedtime   DULoxetine (CYMBALTA) 30 mg, Oral, Daily   empagliflozin (JARDIANCE) 10 mg, Oral, Daily   furosemide (LASIX) 40 mg, Oral, Daily, May take an additional 20 mg ( 0.5 tablet) as needed for swelling.   gabapentin (NEURONTIN) 100 mg, Oral, 3 times daily   insulin glargine (LANTUS) 15 Units, Subcutaneous, Daily   ipratropium-albuterol (DUONEB) 0.5-2.5 (3) MG/3ML SOLN 3 mLs, Nebulization, Every 4 hours PRN   levETIRAcetam (KEPPRA) 500 mg, Oral, 2 times daily   levothyroxine (SYNTHROID) 200 mcg, Oral, Daily   loperamide (IMODIUM) 2  MG capsule Oral   LORazepam (ATIVAN) 1 mg, Oral, Daily at bedtime   melatonin 3 mg, Oral, At bedtime PRN   metoprolol tartrate (LOPRESSOR) 25 mg, Oral, 2 times daily   Multiple Vitamins-Minerals (MULTIVITAMIN WITH MINERALS) tablet 1 tablet, Oral, Daily, For Diabetics    nitroGLYCERIN (NITROSTAT) 0.4 MG SL tablet    NovoLIN R FlexPen ReliOn 2-5 Units, Subcutaneous, 3 times daily before meals, Per sliding scale    pantoprazole (PROTONIX) 40 mg, Oral, 2 times daily before meals   potassium chloride (KLOR-CON) 10 MEQ tablet 20 mEq, Oral, Daily   sertraline (ZOLOFT) 25 mg, Oral, Daily   traMADol (ULTRAM) 50 mg, Oral, 3 times daily   trolamine salicylate (ASPERCREME) 10 % cream 1 application , Topical, As needed   Vitamin D3 2,000 Units, Oral, Daily    ROS   BP 118/74 Comment: 02/14/23 / machine here did not register  Ht 5\' 5"  (1.651 m)   Wt 175 lb (79.4 kg)   BMI 29.12 kg/m   Body mass index is 29.12 kg/m.  General appearance: Well-developed well-nourished no gross deformities  Cardiovascular normal pulse and perfusion normal color without edema  Neurologically no sensation loss or deficits or pathologic reflexes  Psychological: Awake alert and oriented x3 mood and affect normal  Skin no lacerations or ulcerations no nodularity no palpable masses, no erythema or nodularity  Musculoskeletal:  Valgus appearing right  knee medial joint line tenderness no instability she actually has range of motion which is normal      Past Medical History:  Diagnosis Date   Abnormal pulmonary function test    Anemia    H/H of 10/30 with a normal MCV in 12/09   Anxiety    Arthritis    Barrett's esophagus    Diagnosed 1995. Last EGD 2016-NO BARRETT'S.    Chest pain    a. 2022 Cath: nl cors; b. 2008 Neg myoview; c. 01/2014 Cath: Nl cors. EF 40%; c. 08/2020 NSTEMI/Cath [facility]): nl cors.   Chronic anticoagulation    Chronic combined systolic and diastolic CHF (congestive heart failure) (HCC)     a. 10/2014 Echo: EF40%; b. 01/2015 Echo: EF 55-60%; c. 11/2020 Echo: EF 55-60%, mod LVH, sev BAE, mild MR, mod TR. PASP ; d. 01/2021 Echo: EF 45-50%, glob HK, mod conc LVH, mod reduced RV fxn, RVSP 60.31mmHg, Mod TR, mild to mod MR, sev dil LA.   Chronic LBP    Surgical intervention in 1996   Diabetes mellitus, type 2 (HCC)    Insulin therapy; exacerbated by prednisone   Gastroparesis    99% retention 05/2008 on GES   GERD (gastroesophageal reflux disease)    Heart attack (HCC) 08/13/2020   Hiatal hernia    Hyperlipidemia    Hypertension    Hypothyroid    IBS (irritable bowel syndrome)    Obesity    OSA on CPAP    had CPAP and cannot tolerate.   Paroxysmal atrial fibrillation (HCC)    a.CHA2DS2VASc = 6-->eliquis. Also on Amio.   Pulmonary hypertension (HCC) 01/2015   a. 01/2015 RHC: Predominantly pulmonary venous hypertension but may be component of PAH (PA mean 52, PCWP 31); b. 01/2021 Echo: RVSP 60.90mmHg w/ mod reduced RV fxn.   Seizures (HCC)    last seizure was 2 years ago; on keppra for this; unknown etiology   Syncope    a. Admitted 05/2009; magnetic resonance imagin/ MRA - negative; etiology thought to be orthostasis secondary to drugs and dehydration. b. Syncope 02/2015 also felt 2/2 dehydration.    Past Surgical History:  Procedure Laterality Date   BACK SURGERY  1996   BIOPSY N/A 11/08/2013   Procedure: BIOPSY  / Tissue sampling / ulcers present in small intestine;  Surgeon: West Bali, MD;  Location: [facility];  Service: Endoscopy;  Laterality: N/A;   CARDIAC CATHETERIZATION  2002   CARDIAC CATHETERIZATION N/A 01/26/2015   Procedure: Right Heart Cath;  Surgeon: Laurey Morale, MD;  Location: [facility] INVASIVE CV LAB;  Service: Cardiovascular;  Laterality: N/A;   CARDIOVASCULAR STRESS TEST  2008   Stress nuclear study   CARDIOVERSION N/A 03/06/2015   Procedure: CARDIOVERSION;  Surgeon: Laurey Morale, MD;  Location: [facility] ENDOSCOPY;  Service: Cardiovascular;   Laterality: N/A;   CARPAL TUNNEL RELEASE  1994   CATARACT EXTRACTION W/PHACO Right 01/10/2022   Procedure: CATARACT EXTRACTION PHACO AND INTRAOCULAR LENS PLACEMENT (IOC);  Surgeon: [name], MD;  Location: [facility] ORS;  Service: Ophthalmology;  Laterality: Right;  CDE: 21.51   CATARACT EXTRACTION W/PHACO Left 01/24/2022   Procedure: CATARACT EXTRACTION PHACO AND INTRAOCULAR LENS PLACEMENT (IOC);  Surgeon: [name], MD;  Location: [facility] ORS;  Service: Ophthalmology;  Laterality: Left;  CDE: 17.13   COLONOSCOPY  11/2011   Dr. Darrick Penna: Internal hemorrhoids, mild diverticulosis. Random colon biopsies negative.   COLONOSCOPY N/A 11/08/2013   SLF: Normal mucosa in the terminal ileum/The colon  IS redundant/  Moderate diverticulosis throughout the entire colon. ileum bx benign. colon bx benign   CRYOTHERAPY  01/23/2021   Procedure: CRYOTHERAPY;  Surgeon: Lorin Glass, MD;  Location: [facility] ENDOSCOPY;  Service: Pulmonary;;   CRYOTHERAPY  01/24/2021   Procedure: Cloyde Reams;  Surgeon: Lorin Glass, MD;  Location: [facility] ENDOSCOPY;  Service: Pulmonary;;   CRYOTHERAPY  01/26/2021   Procedure: Cloyde Reams;  Surgeon: Lupita Leash, MD;  Location: [facility] ENDOSCOPY;  Service: Cardiopulmonary;;   ESOPHAGOGASTRODUODENOSCOPY  2008   Barrett's without dysplasia. esphagus dilated. antral erosions, h.pylori serologies negative.   ESOPHAGOGASTRODUODENOSCOPY  11/2011   Dr. Darrick Penna: Barrett's esophagus, mild gastritis, diverticulum in the second portion of the duodenum repeat EGD 3 years. Small bowel biopsies negative. Gastric biopsy show reactive gastropathy but no H. pylori. Esophageal biopsies consistent with GERD. Next EGD 11/2014   ESOPHAGOGASTRODUODENOSCOPY N/A 11/21/2014   NGE:XBMW non-erosive gastritis/irregular z-line   ESOPHAGOGASTRODUODENOSCOPY  03/2022   FOREIGN BODY REMOVAL  01/23/2021   Procedure: FOREIGN BODY REMOVAL;  Surgeon: Lorin Glass, MD;  Location: [facility] ENDOSCOPY;  Service: Pulmonary;;    FOREIGN BODY REMOVAL  01/24/2021   Procedure: FOREIGN BODY REMOVAL;  Surgeon: Lorin Glass, MD;  Location: [facility] ENDOSCOPY;  Service: Pulmonary;;   GIVENS CAPSULE STUDY  12/07/2011   Proximal small bowel, rare AVM. Distal small bowel, multiple ulcers noted   GIVENS CAPSULE STUDY N/A 09/27/2013   Distal small bowel ulcers extending to TI.   GIVENS CAPSULE STUDY N/A 10/10/2013   Procedure: GIVENS CAPSULE STUDY;  Surgeon: West Bali, MD;  Location: [facility];  Service: Endoscopy;  Laterality: N/A;  7:30   HEMOSTASIS CONTROL  01/24/2021   Procedure: HEMOSTASIS CONTROL;  Surgeon: Lorin Glass, MD;  Location: [facility] ENDOSCOPY;  Service: Pulmonary;;   HEMOSTASIS CONTROL  01/26/2021   Procedure: HEMOSTASIS CONTROL;  Surgeon: Lupita Leash, MD;  Location: [facility] ENDOSCOPY;  Service: Cardiopulmonary;;   IR GASTROSTOMY TUBE MOD SED  01/11/2021   IR GASTROSTOMY TUBE REMOVAL  04/13/2021   IR KYPHO THORACIC WITH BONE BIOPSY  02/09/2018   KNEE ARTHROSCOPY WITH MEDIAL MENISECTOMY Right 06/09/2016   Procedure: KNEE ARTHROSCOPY WITH MEDIAL MENISECTOMY;  Surgeon: Vickki Hearing, MD;  Location: [facility] ORS;  Service: Orthopedics;  Laterality: Right;  medial and lateral menisectomy   LAMINECTOMY  1995   L4-L5   LAPAROSCOPIC CHOLECYSTECTOMY  1990s   LEFT HEART CATHETERIZATION WITH CORONARY ANGIOGRAM  01/10/2014   Procedure: LEFT HEART CATHETERIZATION WITH CORONARY ANGIOGRAM;  Surgeon: Lesleigh Noe, MD;  Location[facility] CATH LAB;  Service: Cardiovascular;;   MICROLARYNGOSCOPY WITH LASER AND BALLOON DILATION N/A 11/17/2021   Procedure: MICROLARYNGOSCOPY WITH CO2 LASER, SUBGLOTTIC LARYNGEAL MASS BIOSPY;  Surgeon: Serena Colonel, MD;  Location: [facility] OR;  Service: ENT;  Laterality: N/A;   RIGHT HEART CATHETERIZATION N/A 01/10/2014   Procedure: RIGHT HEART CATH;  Surgeon: Lesleigh Noe, MD;  Location: [facility] CATH LAB;  Service: Cardiovascular;  Laterality: N/A;   TOTAL ABDOMINAL HYSTERECTOMY  1999   TRACHEOSTOMY  REVISION N/A 07/18/2021   Procedure: TRACHEOSTOMY REVISION;  Surgeon: Suzanna Obey, MD;  Location: [facility] OR;  Service: ENT;  Laterality: N/A;   TRACHEOSTOMY TUBE PLACEMENT N/A 09/01/2017   Procedure: TRACHEOSTOMY;  Surgeon: Drema Halon, MD;  Location: [facility] OR;  Service: ENT;  Laterality: N/A;   VIDEO BRONCHOSCOPY Bilateral 01/23/2021   Procedure: VIDEO BRONCHOSCOPY WITHOUT FLUORO;  Surgeon: Lorin Glass, MD;  Location: [facility] ENDOSCOPY;  Service: Pulmonary;  Laterality: Bilateral;  VIDEO BRONCHOSCOPY Bilateral 01/24/2021   Procedure: VIDEO BRONCHOSCOPY WITHOUT FLUORO;  Surgeon: Lorin Glass, MD;  Location: [facility] ENDOSCOPY;  Service: Pulmonary;  Laterality: Bilateral;   VIDEO BRONCHOSCOPY N/A 01/26/2021   Procedure: VIDEO BRONCHOSCOPY WITHOUT FLUORO;  Surgeon: Lupita Leash, MD;  Location: [facility] ENDOSCOPY;  Service: Cardiopulmonary;  Laterality: N/A;    Family History  Problem Relation Age of Onset   Hypertension Mother    Alzheimer's disease Mother    Stroke Mother    Heart attack Mother    Breast cancer Sister    Hypertension Other    Heart disease Neg Hx    Colon cancer Neg Hx    Liver disease Neg Hx    Social History   Tobacco Use   Smoking status: Former    Current packs/day: 0.00    Average packs/day: 0.3 packs/day for 27.3 years (6.8 ttl pk-yrs)    Types: Cigarettes    Start date: 02/26/1966    Quit date: 06/30/1993    Years since quitting: 29.6   Smokeless tobacco: Never   Tobacco comments:    quit in 1984  Vaping Use   Vaping status: Never Used  Substance Use Topics   Alcohol use: Not Currently    Comment: Occasional alcohol 30 years ago (09/02/21)   Drug use: No    Allergies  Allergen Reactions   Celexa [Citalopram Hydrobromide] Other (See Comments)    Dyskinesia    Codeine Nausea And Vomiting and Other (See Comments)    HALLUCINATIONS     Dilaudid [Hydromorphone Hcl] Other (See Comments)    Made her pass out    Reglan [Metoclopramide] Other (See  Comments)    DYSKINESIA    Hyoscyamine Other (See Comments)    MADE DIARRHEA WORSE    Current Meds  Medication Sig   apixaban (ELIQUIS) 5 MG TABS tablet Take 1 tablet (5 mg total) by mouth 2 (two) times daily.   atorvastatin (LIPITOR) 80 MG tablet Take 1 tablet (80 mg total) by mouth daily.   Cholecalciferol (VITAMIN D3) 50 MCG (2000 UT) capsule Take 1 capsule (2,000 Units total) by mouth daily.   Continuous Glucose Sensor (FREESTYLE LIBRE 2 SENSOR) MISC by Does not apply route.   cyanocobalamin (VITAMIN B12) 500 MCG tablet Take 1 tablet (500 mcg total) by mouth daily.   dicyclomine (BENTYL) 10 MG capsule TAKE 1 CAPSULE BY MOUTH UP TO FOUR TIMES DAILY BEFORE MEALS AND AT BEDTIME (STOP IF CONSTIPATION)   donepezil (ARICEPT) 5 MG tablet Take 1 tablet (5 mg total) by mouth at bedtime.   DULoxetine (CYMBALTA) 30 MG capsule Take 1 capsule (30 mg total) by mouth daily. (Patient taking differently: Take 30 mg by mouth 2 (two) times daily.)   empagliflozin (JARDIANCE) 10 MG TABS tablet Take 1 tablet (10 mg total) by mouth daily.   furosemide (LASIX) 40 MG tablet Take 1 tablet (40 mg total) by mouth daily. May take an additional 20 mg ( 0.5 tablet) as needed for swelling.   gabapentin (NEURONTIN) 100 MG capsule Take 1 capsule (100 mg total) by mouth 3 (three) times daily.   insulin glargine (LANTUS) 100 UNIT/ML injection Inject 0.15 mLs (15 Units total) into the skin daily. (Patient taking differently: Inject 20-24 Units into the skin at bedtime.)   ipratropium-albuterol (DUONEB) 0.5-2.5 (3) MG/3ML SOLN Take 3 mLs by nebulization every 4 (four) hours as needed (Asthma).   levETIRAcetam (KEPPRA) 500 MG tablet Take 500 mg by mouth 2 (two) times daily.  levothyroxine (SYNTHROID) 200 MCG tablet Take 1 tablet (200 mcg total) by mouth daily.   loperamide (IMODIUM) 2 MG capsule Take by mouth.   LORazepam (ATIVAN) 1 MG tablet Take 1 mg by mouth at bedtime.   melatonin 3 MG TABS tablet Take 1 tablet (3 mg  total) by mouth at bedtime as needed.   metoprolol tartrate (LOPRESSOR) 25 MG tablet Take 1 tablet (25 mg total) by mouth 2 (two) times daily.   Multiple Vitamins-Minerals (MULTIVITAMIN WITH MINERALS) tablet Take 1 tablet by mouth daily. For Diabetics   nitroGLYCERIN (NITROSTAT) 0.4 MG SL tablet    NOVOLIN R FLEXPEN RELION 100 UNIT/ML FlexPen Inject 2-5 Units into the skin 3 (three) times daily before meals. Per sliding scale   pantoprazole (PROTONIX) 40 MG tablet Take 1 tablet (40 mg total) by mouth 2 (two) times daily before a meal.   potassium chloride (KLOR-CON) 10 MEQ tablet Take 20 mEq by mouth daily.   sertraline (ZOLOFT) 25 MG tablet Take 1 tablet (25 mg total) by mouth daily.   traMADol (ULTRAM) 50 MG tablet Take 50 mg by mouth 3 (three) times daily.   trolamine salicylate (ASPERCREME) 10 % cream Apply 1 application. topically as needed (knee pain).     MEDICAL DECISION MAKING  A.  Encounter Diagnoses  Name Primary?   Chronic pain of right knee Yes   Primary osteoarthritis of right knee    Calcium pyrophosphate deposition disease (CPPD)     B. DATA ANALYSED:   IMAGING: Interpretation of images: I have personally reviewed the images and my interpretation is the x-rays we took today show CPPD and osteoarthritis with valgus alignment arthritis grade as a grade 3 Orders:   Outside records reviewed: no   C. MANAGEMENT   With this chronic trach to get oxygen to her to prevent her sleep apnea I would not feel comfortable operating  I recommend injection, economy hinged brace, return in 6 months  Procedure note right knee injection   verbal consent was obtained to inject right knee joint  Timeout was completed to confirm the site of injection  The medications used were depomedrol 40 mg and 1% lidocaine 3 cc Anesthesia was provided by ethyl chloride and the skin was prepped with alcohol.  After cleaning the skin with alcohol a 20-gauge needle was used to inject the  right knee joint. There were no complications. A sterile bandage was applied.   Meds ordered this encounter  Medications   methylPREDNISolone acetate (DEPO-MEDROL) injection 40 mg   Addendum the patient is ambulatory  Fuller Canada, MD  02/20/2023 11:43 AM

## 2023-02-21 ENCOUNTER — Other Ambulatory Visit: Payer: Self-pay | Admitting: Family Medicine

## 2023-02-25 ENCOUNTER — Other Ambulatory Visit: Payer: Self-pay

## 2023-02-25 ENCOUNTER — Encounter: Payer: Self-pay | Admitting: Emergency Medicine

## 2023-02-25 ENCOUNTER — Ambulatory Visit
Admission: EM | Admit: 2023-02-25 | Discharge: 2023-02-25 | Disposition: A | Discharge status: Home / Self Care | Payer: PPO | Source: Home / Self Care

## 2023-02-25 DIAGNOSIS — M25532 Pain in left wrist: Secondary | ICD-10-CM | POA: Diagnosis not present

## 2023-02-25 NOTE — ED Triage Notes (Signed)
Pt family reports pt has complained of left wrist pain that radiates to left elbow. Pt denies any known injury. Radial pulse strong. Grip intact. Pt is currently taking eliquis.

## 2023-02-25 NOTE — Discharge Instructions (Signed)
Your exam and vital signs are very reassuring today.  Apply Voltaren gel to the area up to 4 times daily as needed, ice, compression wraps, Tylenol as needed.  Return for any worsening symptoms.

## 2023-02-26 NOTE — ED Provider Notes (Signed)
[facility]    CSN: [id number] Arrival date & time: 02/25/23  1350      History   Chief Complaint Chief Complaint  Patient presents with   Wrist Pain    HPI [name] is a 72 y.o. female.   Patient presenting to the failure member for evaluation of several day history of left wrist pain that sometimes radiates up toward the elbow.  Denies swelling, discoloration, heat, decreased range of motion, numbness, tingling, chest pain, shortness of breath, palpitations, dizziness, known injury to the area.  So far Tylenol and Voltaren gel with minimal relief.  Of note, on chronic Eliquis secondary to atrial fibrillation.  Patient's daughter states they called the home health nurse about this issue and she told them to be evaluated in case this is representative of a blood clot in the arm.    Past Medical History:  Diagnosis Date   Abnormal pulmonary function test    Anemia    H/H of 10/30 with a normal MCV in 12/09   Anxiety    Arthritis    Barrett's esophagus    Diagnosed 1995. Last EGD 2016-NO BARRETT'S.    Chest pain    a. 2022 Cath: nl cors; b. 2008 Neg myoview; c. 01/2014 Cath: Nl cors. EF 40%; c. 08/2020 NSTEMI/Cath [facility]): nl cors.   Chronic anticoagulation    Chronic combined systolic and diastolic CHF (congestive heart failure) (HCC)    a. 10/2014 Echo: EF40%; b. 01/2015 Echo: EF 55-60%; c. 11/2020 Echo: EF 55-60%, mod LVH, sev BAE, mild MR, mod TR. PASP ; d. 01/2021 Echo: EF 45-50%, glob HK, mod conc LVH, mod reduced RV fxn, RVSP 60.44mmHg, Mod TR, mild to mod MR, sev dil LA.   Chronic LBP    Surgical intervention in 1996   Diabetes mellitus, type 2 (HCC)    Insulin therapy; exacerbated by prednisone   Gastroparesis    99% retention 05/2008 on GES   GERD (gastroesophageal reflux disease)    Heart attack (HCC) 08/13/2020   Hiatal hernia    Hyperlipidemia    Hypertension    Hypothyroid    IBS (irritable bowel syndrome)    Obesity    OSA on CPAP    had  CPAP and cannot tolerate.   Paroxysmal atrial fibrillation (HCC)    a.CHA2DS2VASc = 6-->eliquis. Also on Amio.   Pulmonary hypertension (HCC) 01/2015   a. 01/2015 RHC: Predominantly pulmonary venous hypertension but may be component of PAH (PA mean 52, PCWP 31); b. 01/2021 Echo: RVSP 60.52mmHg w/ mod reduced RV fxn.   Seizures (HCC)    last seizure was 2 years ago; on keppra for this; unknown etiology   Syncope    a. Admitted 05/2009; magnetic resonance imagin/ MRA - negative; etiology thought to be orthostasis secondary to drugs and dehydration. b. Syncope 02/2015 also felt 2/2 dehydration.    Patient Active Problem List   Diagnosis Date Noted   Subglottic stenosis    Tracheostomy complication (HCC) 07/18/2021   Chronic systolic CHF (congestive heart failure) (HCC) 07/18/2021   Mild renal insufficiency 07/18/2021   Tracheostomy care [facility])    Tracheostomy malfunction (HCC)    Subacute cough    Tracheobronchitis    Obesity hypoventilation syndrome (HCC)    Dependence on home ventilator [facility])    Physical deconditioning    Insulin dependent type 2 diabetes mellitus, controlled (HCC)    Hemoptysis    Tracheostomy status (HCC)    Encephalopathy acute  Hypercapnic respiratory failure (HCC) 01/03/2021   Elevated troponin    Acute hypoxemic respiratory failure (HCC) 12/21/2020   Acute on chronic respiratory failure with hypoxia and hypercapnia (HCC)    Endotracheal tube present    Acute on chronic respiratory failure with hypoxia (HCC) 11/22/2020   Altered mental status 11/21/2020   Hypoalbuminemia due to protein-calorie malnutrition (HCC) 11/21/2020   Pressure ulcer 11/21/2020   Acute metabolic encephalopathy 08/30/2020   Acute ischemic stroke [facility])    Respiratory failure with hypoxia (HCC) 08/24/2020   Lobar pneumonia (HCC) 08/24/2020   Septic shock (HCC) 08/24/2020   NSTEMI (non-ST elevated myocardial infarction) (HCC) 08/17/2020   Constipation 03/17/2020   Knee pain 10/07/2019    Fibromyalgia 10/07/2019   Polyarthropathy 10/07/2019   Fibromyositis 09/30/2019   Abnormal gait due to muscle weakness 09/30/2019   Pain in right knee 09/30/2019   Depression 08/31/2018   Anxiety 08/31/2018   Chronic respiratory failure with hypoxia and hypercapnia (HCC) 12/13/2017   Acute systolic HF (heart failure) (HCC) 11/26/2017   Palliative care by specialist    Goals of care, counseling/discussion    Acute on chronic diastolic CHF (congestive heart failure) (HCC) 08/17/2017   Acute respiratory failure with hypoxia and hypercapnia (HCC) 08/16/2017   Uncontrolled type 2 diabetes mellitus with hyperglycemia (HCC) 08/16/2017   Acute respiratory failure (HCC)    CAP (community acquired pneumonia) 08/05/2017   COPD exacerbation (HCC) 08/05/2017   Pulmonary hypertension (HCC) 04/05/2017   Derangement of posterior horn of medial meniscus of right knee    Lateral meniscus, anterior horn derangement, right    Syncope, near 12/26/2015   Fall at home 12/26/2015   Dysphagia 12/07/2015   IDA (iron deficiency anemia) 03/13/2015   Chronic diastolic CHF (congestive heart failure) (HCC) 02/20/2015   Diffuse abdominal pain 09/16/2014   Ascites 08/27/2014   Macrocytic anemia 07/20/2014   Epistaxis, recurrent 07/20/2014   Seizure (HCC) 04/11/2014   Dementia (HCC) 04/11/2014   Chronic diarrhea 04/11/2014   Type 2 diabetes mellitus (HCC) 04/11/2014   Protein-calorie malnutrition, severe (HCC) 04/11/2014   Atrial fibrillation with RVR (HCC) 12/21/2013   Abnormal weight loss 08/26/2013   Lower abdominal pain 08/26/2013   Anorexia nervosa 08/26/2013   Encounter for therapeutic drug monitoring 08/26/2013   DOE (dyspnea on exertion) 06/29/2013   Tremor 10/27/2012   Chronic anticoagulation 06/23/2011   HYPOTENSION, ORTHOSTATIC 08/26/2009   Hyperlipidemia 03/04/2009   Barrett's esophagus 09/26/2008   Gastroparesis 09/26/2008   Iron deficiency anemia 09/25/2008   Hypertension 09/25/2008    Atelectasis 09/25/2008   Irritable bowel syndrome with diarrhea 09/25/2008   OSA (obstructive sleep apnea) 09/25/2008   CARPAL TUNNEL SYNDROME, HX OF 09/25/2008   Hypothyroidism 04/10/2008   Body mass index (BMI) 40.0-44.9, adult (HCC) 04/10/2008   Atrial fibrillation (HCC) 04/10/2008   Acute exacerbation of CHF (congestive heart failure) (HCC) 04/10/2008   Gastroesophageal reflux disease 04/10/2008   Low back pain 04/10/2008   CHEST PAIN-PRECORDIAL 04/10/2008   Obesity 04/10/2008    Past Surgical History:  Procedure Laterality Date   BACK SURGERY  1996   BIOPSY N/A 11/08/2013   Procedure: BIOPSY  / Tissue sampling / ulcers present in small intestine;  Surgeon: West Bali, MD;  [facility];  Service: Endoscopy;  Laterality: N/A;   CARDIAC CATHETERIZATION  2002   CARDIAC CATHETERIZATION N/A 01/26/2015   Procedure: Right Heart Cath;  Surgeon: Laurey Morale, MD;  Location: [facility] INVASIVE CV LAB;  Service: Cardiovascular;  Laterality: N/A;  CARDIOVASCULAR STRESS TEST  2008   Stress nuclear study   CARDIOVERSION N/A 03/06/2015   Procedure: CARDIOVERSION;  Surgeon: Laurey Morale, MD;  Location: [facility] ENDOSCOPY;  Service: Cardiovascular;  Laterality: N/A;   CARPAL TUNNEL RELEASE  1994   CATARACT EXTRACTION W/PHACO Right 01/10/2022   Procedure: CATARACT EXTRACTION PHACO AND INTRAOCULAR LENS PLACEMENT (IOC);  Surgeon: [name], MD;  Location: [facility] ORS;  Service: Ophthalmology;  Laterality: Right;  CDE: 21.51   CATARACT EXTRACTION W/PHACO Left 01/24/2022   Procedure: CATARACT EXTRACTION PHACO AND INTRAOCULAR LENS PLACEMENT (IOC);  Surgeon: [name], MD;  Location: [facility];  Service: Ophthalmology;  Laterality: Left;  CDE: 17.13   COLONOSCOPY  11/2011   Dr. Darrick Penna: Internal hemorrhoids, mild diverticulosis. Random colon biopsies negative.   COLONOSCOPY N/A 11/08/2013   SLF: Normal mucosa in the terminal ileum/The colon IS redundant/  Moderate diverticulosis throughout  the entire colon. ileum bx benign. colon bx benign   CRYOTHERAPY  01/23/2021   Procedure: CRYOTHERAPY;  Surgeon: Lorin Glass, MD;  Location: [facility] ENDOSCOPY;  Service: Pulmonary;;   CRYOTHERAPY  01/24/2021   Procedure: Cloyde Reams;  Surgeon: Lorin Glass, MD;  Location: [facility] ENDOSCOPY;  Service: Pulmonary;;   CRYOTHERAPY  01/26/2021   Procedure: Cloyde Reams;  Surgeon: Lupita Leash, MD;  Location: [facility] ENDOSCOPY;  Service: Cardiopulmonary;;   ESOPHAGOGASTRODUODENOSCOPY  2008   Barrett's without dysplasia. esphagus dilated. antral erosions, h.pylori serologies negative.   ESOPHAGOGASTRODUODENOSCOPY  11/2011   Dr. Darrick Penna: Barrett's esophagus, mild gastritis, diverticulum in the second portion of the duodenum repeat EGD 3 years. Small bowel biopsies negative. Gastric biopsy show reactive gastropathy but no H. pylori. Esophageal biopsies consistent with GERD. Next EGD 11/2014   ESOPHAGOGASTRODUODENOSCOPY N/A 11/21/2014   ZOX:WRUE non-erosive gastritis/irregular z-line   ESOPHAGOGASTRODUODENOSCOPY  03/2022   FOREIGN BODY REMOVAL  01/23/2021   Procedure: FOREIGN BODY REMOVAL;  Surgeon: Lorin Glass, MD;  Location: [facility] ENDOSCOPY;  Service: Pulmonary;;   FOREIGN BODY REMOVAL  01/24/2021   Procedure: FOREIGN BODY REMOVAL;  Surgeon: Lorin Glass, MD;  Location: [facility] ENDOSCOPY;  Service: Pulmonary;;   GIVENS CAPSULE STUDY  12/07/2011   Proximal small bowel, rare AVM. Distal small bowel, multiple ulcers noted   GIVENS CAPSULE STUDY N/A 09/27/2013   Distal small bowel ulcers extending to TI.   GIVENS CAPSULE STUDY N/A 10/10/2013   Procedure: GIVENS CAPSULE STUDY;  Surgeon: West Bali, MD;  [facility];  Service: Endoscopy;  Laterality: N/A;  7:30   HEMOSTASIS CONTROL  01/24/2021   Procedure: HEMOSTASIS CONTROL;  Surgeon: Lorin Glass, MD;  Location: [facility] ENDOSCOPY;  Service: Pulmonary;;   HEMOSTASIS CONTROL  01/26/2021   Procedure: HEMOSTASIS CONTROL;  Surgeon: Lupita Leash, MD;  Location: [facility] ENDOSCOPY;  Service: Cardiopulmonary;;   IR GASTROSTOMY TUBE MOD SED  01/11/2021   IR GASTROSTOMY TUBE REMOVAL  04/13/2021   IR KYPHO THORACIC WITH BONE BIOPSY  02/09/2018   KNEE ARTHROSCOPY WITH MEDIAL MENISECTOMY Right 06/09/2016   Procedure: KNEE ARTHROSCOPY WITH MEDIAL MENISECTOMY;  Surgeon: Vickki Hearing, MD;  Location: [facility];  Service: Orthopedics;  Laterality: Right;  medial and lateral menisectomy   LAMINECTOMY  1995   L4-L5   LAPAROSCOPIC CHOLECYSTECTOMY  1990s   LEFT HEART CATHETERIZATION WITH CORONARY ANGIOGRAM  01/10/2014   Procedure: LEFT HEART CATHETERIZATION WITH CORONARY ANGIOGRAM;  Surgeon: Lesleigh Noe, MD;  Location: [facility] CATH LAB;  Service: Cardiovascular;;   MICROLARYNGOSCOPY WITH LASER AND BALLOON DILATION N/A 11/17/2021  Procedure: MICROLARYNGOSCOPY WITH CO2 LASER, SUBGLOTTIC LARYNGEAL MASS BIOSPY;  Surgeon: Serena Colonel, MD;  Location[facility] OR;  Service: ENT;  Laterality: N/A;   RIGHT HEART CATHETERIZATION N/A 01/10/2014   Procedure: RIGHT HEART CATH;  Surgeon: Lesleigh Noe, MD;  [facility] CATH LAB;  Service: Cardiovascular;  Laterality: N/A;   TOTAL ABDOMINAL HYSTERECTOMY  1999   TRACHEOSTOMY REVISION N/A 07/18/2021   Procedure: TRACHEOSTOMY REVISION;  Surgeon: Suzanna Obey, MD;  Location: [facility] OR;  Service: ENT;  Laterality: N/A;   TRACHEOSTOMY TUBE PLACEMENT N/A 09/01/2017   Procedure: TRACHEOSTOMY;  Surgeon: Drema Halon, MD;  Location: [facility] OR;  Service: ENT;  Laterality: N/A;   VIDEO BRONCHOSCOPY Bilateral 01/23/2021   Procedure: VIDEO BRONCHOSCOPY WITHOUT FLUORO;  Surgeon: Lorin Glass, MD;  Location: [facility] ENDOSCOPY;  Service: Pulmonary;  Laterality: Bilateral;   VIDEO BRONCHOSCOPY Bilateral 01/24/2021   Procedure: VIDEO BRONCHOSCOPY WITHOUT FLUORO;  Surgeon: Lorin Glass, MD;  Location: [facility] ENDOSCOPY;  Service: Pulmonary;  Laterality: Bilateral;   VIDEO BRONCHOSCOPY N/A 01/26/2021   Procedure: VIDEO BRONCHOSCOPY  WITHOUT FLUORO;  Surgeon: Lupita Leash, MD;  Location: [facility] ENDOSCOPY;  Service: Cardiopulmonary;  Laterality: N/A;   OB History     Gravida  2   Para  2   Term  2   Preterm      AB      Living         SAB      IAB      Ectopic      Multiple      Live Births             Home Medications    Prior to Admission medications   Medication Sig Start Date End Date Taking? Authorizing Provider  atorvastatin (LIPITOR) 80 MG tablet TAKE 1 TABLET BY MOUTH EVERY DAY FOR HIGH CHOLESTEROL 02/21/23   Del Newman Nip, Tenna Child, FNP  Cholecalciferol (VITAMIN D3) 50 MCG (2000 UT) capsule Take 1 capsule (2,000 Units total) by mouth daily. 01/09/23   Del Nigel Berthold, FNP  Continuous Glucose Sensor (FREESTYLE LIBRE 2 SENSOR) MISC by Does not apply route.    [provider]  cyanocobalamin (VITAMIN B12) 500 MCG tablet Take 1 tablet (500 mcg total) by mouth daily. 12/28/22   Mauro Kaufmann, NP  dicyclomine (BENTYL) 10 MG capsule TAKE 1 CAPSULE BY MOUTH UP TO FOUR TIMES DAILY BEFORE MEALS AND AT BEDTIME (STOP IF CONSTIPATION) 11/21/22   Letta Median, PA-C  donepezil (ARICEPT) 5 MG tablet TAKE 1 TABLET BY MOUTH AT BEDTIME FOR dementia 02/21/23   Del Newman Nip, Tenna Child, FNP  DULoxetine (CYMBALTA) 30 MG capsule Take 1 capsule (30 mg total) by mouth daily. Patient taking differently: Take 30 mg by mouth 2 (two) times daily. 07/20/21   Dahal, Melina Schools, MD  ELIQUIS 5 MG TABS tablet TAKE 1 TABLET BY MOUTH TWICE DAILY AS A BLOOD THINNER 02/21/23   Del Newman Nip, Tenna Child, FNP  empagliflozin (JARDIANCE) 10 MG TABS tablet Take 1 tablet (10 mg total) by mouth daily. 01/09/23   Del Nigel Berthold, FNP  furosemide (LASIX) 40 MG tablet TAKE 1 TABLET BY MOUTH DAILY FOR FLUID 02/21/23   Del Newman Nip, [place], FNP  gabapentin (NEURONTIN) 100 MG capsule TAKE ONE CAPSULE BY MOUTH THREE TIMES DAILY FOR NEUROPATHIC PAIN 02/21/23   Del Newman Nip, Tenna Child, FNP  insulin glargine (LANTUS)  100 UNIT/ML injection Inject 0.15 mLs (15 Units total) into the skin daily. Patient  taking differently: Inject 20-24 Units into the skin at bedtime. 07/19/21   Dahal, Melina Schools, MD  ipratropium-albuterol (DUONEB) 0.5-2.5 (3) MG/3ML SOLN Take 3 mLs by nebulization every 4 (four) hours as needed (Asthma). 11/08/21   [provider]  levETIRAcetam (KEPPRA) 500 MG tablet Take 500 mg by mouth 2 (two) times daily. 07/09/21   [provider]  levothyroxine (SYNTHROID) 200 MCG tablet Take 1 tablet (200 mcg total) by mouth daily. 01/09/23   Del Nigel Berthold, FNP  loperamide (IMODIUM) 2 MG capsule Take by mouth. 07/09/21   [provider]  LORazepam (ATIVAN) 1 MG tablet Take 1 mg by mouth at bedtime.    [provider]  melatonin 3 MG TABS tablet Take 1 tablet (3 mg total) by mouth at bedtime as needed. 07/19/21   Lorin Glass, MD  metoprolol tartrate (LOPRESSOR) 25 MG tablet Take 1 tablet (25 mg total) by mouth 2 (two) times daily. 07/19/21   Lorin Glass, MD  Multiple Vitamins-Minerals (MULTIVITAMIN WITH MINERALS) tablet Take 1 tablet by mouth daily. For Diabetics    [provider]  nitroGLYCERIN (NITROSTAT) 0.4 MG SL tablet  03/08/22   [provider]  NOVOLIN R FLEXPEN RELION 100 UNIT/ML FlexPen Inject 2-5 Units into the skin 3 (three) times daily before meals. Per sliding scale 10/08/21   [provider]  pantoprazole (PROTONIX) 40 MG tablet Take 1 tablet (40 mg total) by mouth 2 (two) times daily before a meal. 02/15/21   Russella Dar, NP  potassium chloride (KLOR-CON) 10 MEQ tablet Take 20 mEq by mouth daily. 11/08/21   [provider]  sertraline (ZOLOFT) 25 MG tablet Take 1 tablet (25 mg total) by mouth daily. 07/20/21   Dahal, Melina Schools, MD  traMADol (ULTRAM) 50 MG tablet Take 50 mg by mouth 3 (three) times daily. 12/08/21   [provider]  trolamine salicylate (ASPERCREME) 10 % cream Apply 1 application. topically as needed (knee  pain).    [provider]    Family History Family History  Problem Relation Age of Onset   Hypertension Mother    Alzheimer's disease Mother    Stroke Mother    Heart attack Mother    Breast cancer Sister    Hypertension Other    Heart disease Neg Hx    Colon cancer Neg Hx    Liver disease Neg Hx    Social History Social History   Tobacco Use   Smoking status: Former    Current packs/day: 0.00    Average packs/day: 0.3 packs/day for 27.3 years (6.8 ttl pk-yrs)    Types: Cigarettes    Start date: 02/26/1966    Quit date: 06/30/1993    Years since quitting: 29.6   Smokeless tobacco: Never   Tobacco comments:    quit in 1984  Vaping Use   Vaping status: Never Used  Substance Use Topics   Alcohol use: Not Currently    Comment: Occasional alcohol 30 years ago (09/02/21)   Drug use: No     Allergies   Celexa [citalopram hydrobromide], Codeine, Dilaudid [hydromorphone hcl], Reglan [metoclopramide], and Hyoscyamine   Review of Systems Review of Systems Per HPI  Physical Exam Triage Vital Signs ED Triage Vitals  Encounter Vitals Group     BP 02/25/23 1444 109/61     Systolic BP Percentile --      Diastolic BP Percentile --      Pulse Rate 02/25/23 1444 67     Resp 02/25/23 1444 (!)  24     Temp 02/25/23 1444 98.4 F (36.9 C)     Temp Source 02/25/23 1444 Oral     SpO2 02/25/23 1444 99 %     Weight --      Height --      Head Circumference --      Peak Flow --      Pain Score 02/25/23 1442 7     Pain Loc --      Pain Education --      Exclude from Growth Chart --    No data found.  Updated Vital Signs BP 109/61 (BP Location: Right Arm)   Pulse 67   Temp 98.4 F (36.9 C) (Oral)   Resp (!) 24   SpO2 99%   Visual Acuity Right Eye Distance:   Left Eye Distance:   Bilateral Distance:    Right Eye Near:   Left Eye Near:    Bilateral Near:     Physical Exam Vitals and nursing note reviewed.  Constitutional:      Appearance: Normal  appearance. She is not ill-appearing.  HENT:     Head: Atraumatic.     Mouth/Throat:     Mouth: Mucous membranes are moist.  Eyes:     Extraocular Movements: Extraocular movements intact.     Conjunctiva/sclera: Conjunctivae normal.  Cardiovascular:     Rate and Rhythm: Normal rate.  Pulmonary:     Effort: Pulmonary effort is normal.  Musculoskeletal:        General: Tenderness present. No swelling, deformity or signs of injury. Normal range of motion.     Cervical back: Normal range of motion and neck supple.     Comments: Left arm benign-appearing, mildly tender to palpation over the left wrist particularly centrally.  No edema, discoloration, decreased range of motion  Skin:    General: Skin is warm and dry.     Findings: No bruising or erythema.  Neurological:     Mental Status: She is alert and oriented to person, place, and time.     Motor: No weakness.     Gait: Gait normal.     Comments: Left upper extremity neurovascularly intact  Psychiatric:        Mood and Affect: Mood normal.        Thought Content: Thought content normal.        Judgment: Judgment normal.      UC Treatments / Results  Labs (all labs ordered are listed, but only abnormal results are displayed) Labs Reviewed - No data to display  EKG   Radiology No results found.  Procedures Procedures (including critical care time)  Medications Ordered in UC Medications - No data to display  Initial Impression / Assessment and Plan / UC Course  I have reviewed the triage vital signs and the nursing notes.  Pertinent labs & imaging results that were available during my care of the patient were reviewed by me and considered in my medical decision making (see chart for details).     Suspect arthritic in nature, very low suspicion for gout, fracture, DVT, septic joint at this time.  Discussed Voltaren gel, Tylenol, Ace wrap applied for stability, orthopedic follow-up if worsening or not  resolving.  Final Clinical Impressions(s) / UC Diagnoses   Final diagnoses:  Left wrist pain     Discharge Instructions      Your exam and vital signs are very reassuring today.  Apply Voltaren gel to the area up to  4 times daily as needed, ice, compression wraps, Tylenol as needed.  Return for any worsening symptoms.    ED Prescriptions   None    PDMP not reviewed this encounter.   Particia Nearing, [place] 02/26/23 1505

## 2023-03-01 ENCOUNTER — Ambulatory Visit (HOSPITAL_COMMUNITY)
Admission: RE | Admit: 2023-03-01 | Discharge: 2023-03-01 | Disposition: A | Discharge status: Home / Self Care | Payer: PPO | Source: Ambulatory Visit | Attending: Cardiology | Admitting: Cardiology

## 2023-03-01 ENCOUNTER — Other Ambulatory Visit: Payer: Self-pay | Admitting: Family Medicine

## 2023-03-01 DIAGNOSIS — M79605 Pain in left leg: Secondary | ICD-10-CM | POA: Diagnosis present

## 2023-03-01 DIAGNOSIS — M79604 Pain in right leg: Secondary | ICD-10-CM | POA: Insufficient documentation

## 2023-03-02 ENCOUNTER — Other Ambulatory Visit: Payer: Self-pay

## 2023-03-02 ENCOUNTER — Other Ambulatory Visit: Payer: Self-pay | Admitting: Internal Medicine

## 2023-03-02 ENCOUNTER — Other Ambulatory Visit: Payer: Self-pay | Admitting: Family Medicine

## 2023-03-02 ENCOUNTER — Telehealth: Payer: Self-pay | Admitting: Family Medicine

## 2023-03-02 LAB — VAS US ABI WITH/WO TBI

## 2023-03-02 MED ORDER — FREESTYLE LIBRE 2 SENSOR MISC: 3 refills | Status: DC

## 2023-03-02 NOTE — Telephone Encounter (Signed)
Daughter patient called said went to pharmacy to pick up Continuous Glucose Sensor (FREESTYLE LIBRE 2 SENSOR) MISC [[id number]] patient pcp now is Tenna Child, Dr [name] has retired.  Pharmacy: [facility]

## 2023-03-02 NOTE — Telephone Encounter (Signed)
sent 

## 2023-03-03 ENCOUNTER — Telehealth: Payer: Self-pay | Admitting: Family Medicine

## 2023-03-03 NOTE — Telephone Encounter (Signed)
Pharmacy asking for script to be corrected. Please update if needed

## 2023-03-03 NOTE — Telephone Encounter (Signed)
\  [name] called in on patient behalf.  Needs clarification on patients traMADol (ULTRAM) 50 MG tablet    Patient was taking 3x daily , new script states  Take 1 tablet (50 mg total) by mouth once as needed for up to 1 dose for moderate pain or severe pain.   Pharm needs clarification   Wants a cll back  [name] [place], [place] - [address], [place] [address] Phone: [phone]  Fax: [phone]

## 2023-03-12 IMAGING — DX DG CHEST 1V PORT
1 series · 1 of 1 positions shown · non-contrast
Comparison: 09/20/2020

CLINICAL DATA: Altered mental status

EXAM:
PORTABLE CHEST 1 VIEW

[chest ap]
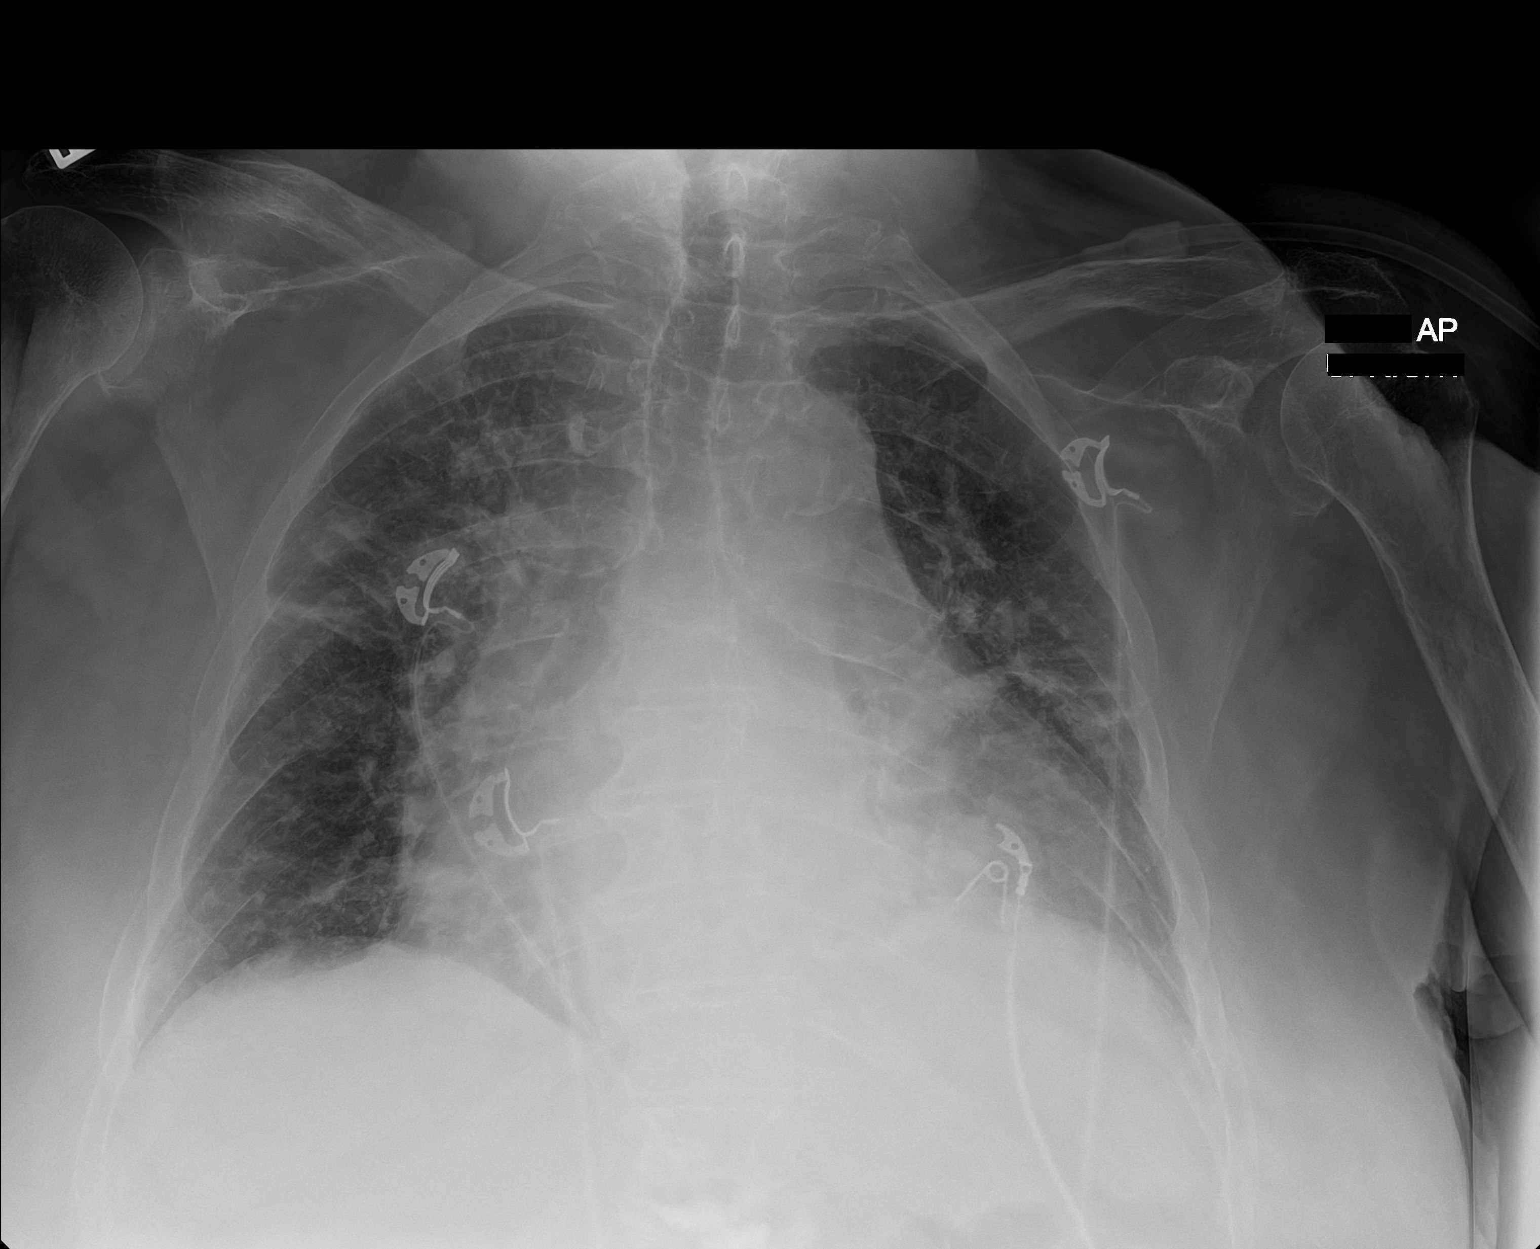

[1 of 1 positions shown; findings below may reference images not displayed]

FINDINGS: Stable cardiomegaly. Atherosclerotic calcification of the aortic
knob. Pulmonary vascular congestion with bilateral interstitial
prominence. More patchy airspace opacity within the right upper lobe
and left perihilar region. No significant pleural fluid collection.
No pneumothorax.
IMPRESSION: Cardiomegaly with pulmonary vascular congestion and bilateral
interstitial prominence. More patchy airspace opacity within the
right upper lobe and left perihilar region may reflect asymmetric
edema versus infiltrate.

## 2023-03-15 ENCOUNTER — Other Ambulatory Visit: Payer: Self-pay

## 2023-03-15 DIAGNOSIS — D500 Iron deficiency anemia secondary to blood loss (chronic): Secondary | ICD-10-CM

## 2023-03-16 ENCOUNTER — Encounter: Payer: Self-pay | Admitting: Cardiovascular Disease

## 2023-03-16 ENCOUNTER — Inpatient Hospital Stay: Payer: PPO | Attending: Hematology

## 2023-03-16 DIAGNOSIS — D509 Iron deficiency anemia, unspecified: Secondary | ICD-10-CM | POA: Diagnosis present

## 2023-03-16 DIAGNOSIS — J9610 Chronic respiratory failure, unspecified whether with hypoxia or hypercapnia: Secondary | ICD-10-CM | POA: Insufficient documentation

## 2023-03-16 DIAGNOSIS — D500 Iron deficiency anemia secondary to blood loss (chronic): Secondary | ICD-10-CM

## 2023-03-16 DIAGNOSIS — M25561 Pain in right knee: Secondary | ICD-10-CM | POA: Diagnosis not present

## 2023-03-16 LAB — CBC WITH DIFFERENTIAL/PLATELET
Abs Immature Granulocytes: 0.01 10*3/uL (ref 0.00–0.07)
Basophils Absolute: 0 10*3/uL (ref 0.0–0.1)
Basophils Relative: 1 %
Eosinophils Absolute: 0.1 10*3/uL (ref 0.0–0.5)
Eosinophils Relative: 1 %
HCT: 37 % (ref 36.0–46.0)
Hemoglobin: 11.8 g/dL — ABNORMAL LOW (ref 12.0–15.0)
Immature Granulocytes: 0 %
Lymphocytes Relative: 30 %
Lymphs Abs: 1.9 10*3/uL (ref 0.7–4.0)
MCH: 31.4 pg (ref 26.0–34.0)
MCHC: 31.9 g/dL (ref 30.0–36.0)
MCV: 98.4 fL (ref 80.0–100.0)
Monocytes Absolute: 0.5 10*3/uL (ref 0.1–1.0)
Monocytes Relative: 8 %
Neutro Abs: 3.9 10*3/uL (ref 1.7–7.7)
Neutrophils Relative %: 60 %
Platelets: 152 10*3/uL (ref 150–400)
RBC: 3.76 MIL/uL — ABNORMAL LOW (ref 3.87–5.11)
RDW: 14.3 % (ref 11.5–15.5)
WBC: 6.4 10*3/uL (ref 4.0–10.5)
nRBC: 0 % (ref 0.0–0.2)

## 2023-03-16 LAB — IRON AND TIBC
Iron: 41 ug/dL (ref 28–170)
Saturation Ratios: 16 % (ref 10.4–31.8)
TIBC: 261 ug/dL (ref 250–450)
UIBC: 220 ug/dL

## 2023-03-16 LAB — VITAMIN B12: Vitamin B-12: 1152 pg/mL — ABNORMAL HIGH (ref 180–914)

## 2023-03-16 LAB — FERRITIN: Ferritin: 90 ng/mL (ref 11–307)

## 2023-03-20 ENCOUNTER — Ambulatory Visit (HOSPITAL_COMMUNITY)
Admission: RE | Admit: 2023-03-20 | Discharge: 2023-03-20 | Disposition: A | Discharge status: Home / Self Care | Payer: PPO | Source: Ambulatory Visit | Attending: Acute Care | Admitting: Acute Care

## 2023-03-20 DIAGNOSIS — J386 Stenosis of larynx: Secondary | ICD-10-CM | POA: Diagnosis present

## 2023-03-20 DIAGNOSIS — G4733 Obstructive sleep apnea (adult) (pediatric): Secondary | ICD-10-CM | POA: Diagnosis present

## 2023-03-20 DIAGNOSIS — J9611 Chronic respiratory failure with hypoxia: Secondary | ICD-10-CM | POA: Diagnosis present

## 2023-03-20 DIAGNOSIS — Z930 Tracheostomy status: Secondary | ICD-10-CM | POA: Insufficient documentation

## 2023-03-20 DIAGNOSIS — Z9911 Dependence on respirator [ventilator] status: Secondary | ICD-10-CM

## 2023-03-20 DIAGNOSIS — E662 Morbid (severe) obesity with alveolar hypoventilation: Secondary | ICD-10-CM | POA: Diagnosis present

## 2023-03-20 NOTE — Progress Notes (Signed)
Tracheostomy Procedure Note  [name] [id number] [birth year]  Pre Procedure Tracheostomy Information  Trach Brand: Shiley Size:  6.0 [id number] Style: Cuffed Secured by: Velcro   Procedure: Trach change and Trach Cleaning    Post Procedure Tracheostomy Information  Trach Brand: Shiley Size:  6.0 [id number] Style: Cuffed Secured by: Velcro   Post Procedure Evaluation:  ETCO2 positive color change from yellow to purple : Yes.   Vital signs:VSS Patients current condition: stable Complications: No apparent complications Trach site exam: clean, dry Wound care done: 4 x 4 gauze drain Patient did tolerate procedure well.   Education: none  Prescription needs: none    Additional needs: 1 box of  #6 inner cannulas [id number]

## 2023-03-20 NOTE — Progress Notes (Signed)
Reason for visit Planned trach change  HPI 72 year old pt well known to me. Follow her for trach dependence for OHS,OSA on nocturnal ventilation. I last saw her Aug 2023. She has a more complicated trach hx for on-going aphonia for which I referred to her [facility]NT.   Recent ENT Hx -Referred to ENT May 2023 for subglottis stenosis. subglottic mass removed  -direct laryngoscopy and airway evaluation in September 2023.  Large subglottic posteriorly-based granuloma which was eminating superiorly through the glottis, biopsied and send for permanent pathology; thick circumferential subglottic granulations tissue, which was removed with cupped forceps and coblator  Much improved subglottic airway following the procedure F/u 11/8: had been doing better initially post-op.  he visualized subglottis and proximal trachea are patent and the Shiley tracheostomy tube is visible in the subglottis the trachea is edematous and there does not appear to be much room around the tracheostomy device they recommended fenestrated trach which we placed.  This was subsequently discontinued as the patient did not tolerate it.  Presents today for planned trach change   Review of Systems  Constitutional: Negative.   HENT:  Positive for congestion and sore throat.        Due to environmental allergies    Eyes: Negative.   Respiratory: Negative.    Cardiovascular: Negative.   Gastrointestinal: Negative.   Genitourinary: Negative.   Musculoskeletal: Negative.   Skin: Negative.   Neurological: Negative.   Endo/Heme/Allergies: Negative.     Exam General 72 year old female. Resting in chair. No distress HENT NCAT size 6 cuffed trach. Cuff deflated. Phonation coarse but intelligible w/ PMV Pulm clear  Card rrr Abd soft Ext warm no edema  Neuro intact    Procedure Trach removed.site inspected, new trach placed over obturator. Pt tolerated well. Placement verified via etco2   Active Problems:   Tracheostomy  status (HCC)   Subglottic stenosis   OSA (obstructive sleep apnea)   Chronic respiratory failure with hypoxia and hypercapnia (HCC)   Dependence on home ventilator (HCC)   Obesity hypoventilation syndrome (HCC)       Tracheostomy status (HCC) Overview:  Trach Brand: Shiley Size:  6.0 [id number] Style: Cuffed Secured by: Velcro  Last change 8/9 (prior); most recent  9/16  Discussion Stable tracheostomy.  Not a candidate for decannulation due to subglottic stenosis.   Plan Continue routine trach care ROV 4 weeks for planned tracheostomy change Continue nocturnal ventilation         My time 13 min   Simonne Martinet ACNP-BC [facility] Pager # [phone] OR # [phone] if no answer

## 2023-03-22 ENCOUNTER — Other Ambulatory Visit: Payer: Self-pay | Admitting: Family Medicine

## 2023-03-22 ENCOUNTER — Other Ambulatory Visit: Payer: Self-pay | Admitting: Gastroenterology

## 2023-03-22 DIAGNOSIS — K580 Irritable bowel syndrome with diarrhea: Secondary | ICD-10-CM

## 2023-03-23 ENCOUNTER — Encounter: Payer: Self-pay | Admitting: Oncology

## 2023-03-23 ENCOUNTER — Inpatient Hospital Stay (HOSPITAL_BASED_OUTPATIENT_CLINIC_OR_DEPARTMENT_OTHER): Payer: PPO | Admitting: Oncology

## 2023-03-23 VITALS — BP 128/53 | HR 68 | Temp 99.4°F | Resp 22 | Wt 176.0 lb

## 2023-03-23 DIAGNOSIS — D500 Iron deficiency anemia secondary to blood loss (chronic): Secondary | ICD-10-CM

## 2023-03-23 DIAGNOSIS — D509 Iron deficiency anemia, unspecified: Secondary | ICD-10-CM | POA: Diagnosis not present

## 2023-03-23 NOTE — Progress Notes (Signed)
[facility] [address] [place] [address]   Clinic Day:  03/23/2023  Referring physician: Wylene Men*  Patient Care Team: Del Newman Nip, Tenna Child, FNP as PCP - General (Family Medicine) Wyline Mood, Dorothe Pea, MD as PCP - Cardiology (Cardiology) Iran Ouch, MD as PCP - [facility] Cardiology (Cardiology) West Bali, MD (Inactive) as Consulting Physician (Gastroenterology) Galen Manila, Novella Olive, MD (Inactive) as Consulting Physician (Hematology and Oncology) Lanelle Bal, DO as Consulting Physician (Internal Medicine)   CHIEF COMPLAINT/PURPOSE OF CONSULT:   Diagnosis: Iron deficiency anemia  HISTORY OF PRESENT ILLNESS:   [name] is a 72 y.o. female presenting to clinic today for evaluation of iron deficiency anemia. She was last seen in clinic on 12/28/2022.  She received 1 g of Monoferric on 11/02/2022 with improvement of her energy levels.   She was instructed to stop B12 supplements for a rising B12 level.  Reports overall feeling well today.  She presents with her daughter.  Appetite and energy levels are 50%.  Continues to have intermittent right knee pain.  Patient has a trach for chronic respiratory failure and supraglottic stenosis.  She recently had her trach changed on 02/10/2023.  She has been using her home ventilator more frequently as previously only needing it at bedtime.  She is unfortunately not a candidate for decannulation due to supraglottic stenosis.  She denies any bright red blood per rectum, melena or hematochezia.  Reports some increased fatigue and sleeping longer over the past month or so.   PAST MEDICAL HISTORY:   Past Medical History: Past Medical History:  Diagnosis Date   Abnormal pulmonary function test    Anemia    H/H of 10/30 with a normal MCV in 12/09   Anxiety    Arthritis    Barrett's esophagus    Diagnosed 1995. Last EGD 2016-NO BARRETT'S.    Chest pain    a. 2022 Cath: nl cors; b. 2008 Neg myoview;  c. 01/2014 Cath: Nl cors. EF 40%; c. 08/2020 NSTEMI/Cath [facility]): nl cors.   Chronic anticoagulation    Chronic combined systolic and diastolic CHF (congestive heart failure) (HCC)    a. 10/2014 Echo: EF40%; b. 01/2015 Echo: EF 55-60%; c. 11/2020 Echo: EF 55-60%, mod LVH, sev BAE, mild MR, mod TR. PASP ; d. 01/2021 Echo: EF 45-50%, glob HK, mod conc LVH, mod reduced RV fxn, RVSP 60.28mmHg, Mod TR, mild to mod MR, sev dil LA.   Chronic LBP    Surgical intervention in 1996   Diabetes mellitus, type 2 (HCC)    Insulin therapy; exacerbated by prednisone   Gastroparesis    99% retention 05/2008 on GES   GERD (gastroesophageal reflux disease)    Heart attack (HCC) 08/13/2020   Hiatal hernia    Hyperlipidemia    Hypertension    Hypothyroid    IBS (irritable bowel syndrome)    Obesity    OSA on CPAP    had CPAP and cannot tolerate.   Paroxysmal atrial fibrillation (HCC)    a.CHA2DS2VASc = 6-->eliquis. Also on Amio.   Pulmonary hypertension (HCC) 01/2015   a. 01/2015 RHC: Predominantly pulmonary venous hypertension but may be component of PAH (PA mean 52, PCWP 31); b. 01/2021 Echo: RVSP 60.70mmHg w/ mod reduced RV fxn.   Seizures (HCC)    last seizure was 2 years ago; on keppra for this; unknown etiology   Syncope    a. Admitted 05/2009; magnetic resonance imagin/ MRA - negative; etiology  thought to be orthostasis secondary to drugs and dehydration. b. Syncope 02/2015 also felt 2/2 dehydration.    Surgical History: Past Surgical History:  Procedure Laterality Date   BACK SURGERY  1996   BIOPSY N/A 11/08/2013   Procedure: BIOPSY  / Tissue sampling / ulcers present in small intestine;  Surgeon: West Bali, MD;  [facility]: [facility] ENDO [facility];  Service: Endoscopy;  Laterality: N/A;   CARDIAC CATHETERIZATION  2002   CARDIAC CATHETERIZATION N/A 01/26/2015   Procedure: Right Heart Cath;  Surgeon: Laurey Morale, MD;  Location: [facility] INVASIVE CV LAB;  Service: Cardiovascular;  Laterality: N/A;    CARDIOVASCULAR STRESS TEST  2008   Stress nuclear study   CARDIOVERSION N/A 03/06/2015   Procedure: CARDIOVERSION;  Surgeon: Laurey Morale, MD;  Location: [facility] ENDOSCOPY;  Service: Cardiovascular;  Laterality: N/A;   CARPAL TUNNEL RELEASE  1994   CATARACT EXTRACTION W/PHACO Right 01/10/2022   Procedure: CATARACT EXTRACTION PHACO AND INTRAOCULAR LENS PLACEMENT (IOC);  Surgeon: [name], MD;  Location: [facility] ORS;  Service: Ophthalmology;  Laterality: Right;  CDE: 21.51   CATARACT EXTRACTION W/PHACO Left 01/24/2022   Procedure: CATARACT EXTRACTION PHACO AND INTRAOCULAR LENS PLACEMENT (IOC);  Surgeon: [name], MD;  Location: [facility] ORS;  Service: Ophthalmology;  Laterality: Left;  CDE: 17.13   COLONOSCOPY  11/2011   Dr. Darrick Penna: Internal hemorrhoids, mild diverticulosis. Random colon biopsies negative.   COLONOSCOPY N/A 11/08/2013   SLF: Normal mucosa in the terminal ileum/The colon IS redundant/  Moderate diverticulosis throughout the entire colon. ileum bx benign. colon bx benign   CRYOTHERAPY  01/23/2021   Procedure: CRYOTHERAPY;  Surgeon: Lorin Glass, MD;  Location: [facility] ENDOSCOPY;  Service: Pulmonary;;   CRYOTHERAPY  01/24/2021   Procedure: Cloyde Reams;  Surgeon: Lorin Glass, MD;  Location: [facility] ENDOSCOPY;  Service: Pulmonary;;   CRYOTHERAPY  01/26/2021   Procedure: Cloyde Reams;  Surgeon: Lupita Leash, MD;  Location: [facility] ENDOSCOPY;  Service: Cardiopulmonary;;   ESOPHAGOGASTRODUODENOSCOPY  2008   Barrett's without dysplasia. esphagus dilated. antral erosions, h.pylori serologies negative.   ESOPHAGOGASTRODUODENOSCOPY  11/2011   Dr. Darrick Penna: Barrett's esophagus, mild gastritis, diverticulum in the second portion of the duodenum repeat EGD 3 years. Small bowel biopsies negative. Gastric biopsy show reactive gastropathy but no H. pylori. Esophageal biopsies consistent with GERD. Next EGD 11/2014   ESOPHAGOGASTRODUODENOSCOPY N/A 11/21/2014   BMW:UXLK non-erosive gastritis/irregular  z-line   ESOPHAGOGASTRODUODENOSCOPY  03/2022   FOREIGN BODY REMOVAL  01/23/2021   Procedure: FOREIGN BODY REMOVAL;  Surgeon: Lorin Glass, MD;  Location: [facility] ENDOSCOPY;  Service: Pulmonary;;   FOREIGN BODY REMOVAL  01/24/2021   Procedure: FOREIGN BODY REMOVAL;  Surgeon: Lorin Glass, MD;  Location: [facility] ENDOSCOPY;  Service: Pulmonary;;   GIVENS CAPSULE STUDY  12/07/2011   Proximal small bowel, rare AVM. Distal small bowel, multiple ulcers noted   GIVENS CAPSULE STUDY N/A 09/27/2013   Distal small bowel ulcers extending to TI.   GIVENS CAPSULE STUDY N/A 10/10/2013   Procedure: GIVENS CAPSULE STUDY;  Surgeon: West Bali, MD;  [facility]: [facility];  Service: Endoscopy;  Laterality: N/A;  7:30   HEMOSTASIS CONTROL  01/24/2021   Procedure: HEMOSTASIS CONTROL;  Surgeon: Lorin Glass, MD;  Location: [facility] ENDOSCOPY;  Service: Pulmonary;;   HEMOSTASIS CONTROL  01/26/2021   Procedure: HEMOSTASIS CONTROL;  Surgeon: Lupita Leash, MD;  Location: [facility] ENDOSCOPY;  Service: Cardiopulmonary;;   IR GASTROSTOMY TUBE MOD SED  01/11/2021   IR GASTROSTOMY TUBE REMOVAL  04/13/2021   IR KYPHO THORACIC WITH BONE BIOPSY  02/09/2018   KNEE ARTHROSCOPY WITH MEDIAL MENISECTOMY Right 06/09/2016   Procedure: KNEE ARTHROSCOPY WITH MEDIAL MENISECTOMY;  Surgeon: Vickki Hearing, MD;  Location: [facility] ORS;  Service: Orthopedics;  Laterality: Right;  medial and lateral menisectomy   LAMINECTOMY  1995   L4-L5   LAPAROSCOPIC CHOLECYSTECTOMY  1990s   LEFT HEART CATHETERIZATION WITH CORONARY ANGIOGRAM  01/10/2014   Procedure: LEFT HEART CATHETERIZATION WITH CORONARY ANGIOGRAM;  Surgeon: Lesleigh Noe, MD;  Location: [facility] CATH LAB;  Service: Cardiovascular;;   MICROLARYNGOSCOPY WITH LASER AND BALLOON DILATION N/A 11/17/2021   Procedure: MICROLARYNGOSCOPY WITH CO2 LASER, SUBGLOTTIC LARYNGEAL MASS BIOSPY;  Surgeon: Serena Colonel, MD;  Location: [facility] OR;  Service: ENT;  Laterality: N/A;   RIGHT HEART CATHETERIZATION  N/A 01/10/2014   Procedure: RIGHT HEART CATH;  Surgeon: Lesleigh Noe, MD;  Location: [facility] CATH LAB;  Service: Cardiovascular;  Laterality: N/A;   TOTAL ABDOMINAL HYSTERECTOMY  1999   TRACHEOSTOMY REVISION N/A 07/18/2021   Procedure: TRACHEOSTOMY REVISION;  Surgeon: Suzanna Obey, MD;  Location: [facility] OR;  Service: ENT;  Laterality: N/A;   TRACHEOSTOMY TUBE PLACEMENT N/A 09/01/2017   Procedure: TRACHEOSTOMY;  Surgeon: Drema Halon, MD;  Location: [facility] OR;  Service: ENT;  Laterality: N/A;   VIDEO BRONCHOSCOPY Bilateral 01/23/2021   Procedure: VIDEO BRONCHOSCOPY WITHOUT FLUORO;  Surgeon: Lorin Glass, MD;  Location: [facility] ENDOSCOPY;  Service: Pulmonary;  Laterality: Bilateral;   VIDEO BRONCHOSCOPY Bilateral 01/24/2021   Procedure: VIDEO BRONCHOSCOPY WITHOUT FLUORO;  Surgeon: Lorin Glass, MD;  Location: [facility] ENDOSCOPY;  Service: Pulmonary;  Laterality: Bilateral;   VIDEO BRONCHOSCOPY N/A 01/26/2021   Procedure: VIDEO BRONCHOSCOPY WITHOUT FLUORO;  Surgeon: Lupita Leash, MD;  Location: [facility] ENDOSCOPY;  Service: Cardiopulmonary;  Laterality: N/A;    Social History: Social History   Socioeconomic History   Marital status: Married    Spouse name: Not on file   Number of children: Not on file   Years of education: Not on file   Highest education level: Not on file  Occupational History   Occupation: Passenger transport manager at [employer]    Employer: [employer]    Comment: disabled    Employer: RETIRED  Tobacco Use   Smoking status: Former    Current packs/day: 0.00    Average packs/day: 0.3 packs/day for 27.3 years (6.8 ttl pk-yrs)    Types: Cigarettes    Start date: 02/26/1966    Quit date: 06/30/1993    Years since quitting: 29.7   Smokeless tobacco: Never   Tobacco comments:    quit in 1984  Vaping Use   Vaping status: Never Used  Substance and Sexual Activity   Alcohol use: Not Currently    Comment: Occasional alcohol 30 years ago (09/02/21)   Drug use: No   Sexual activity: Yes    Birth  control/protection: None, Surgical  Other Topics Concern   Not on file  Social History Narrative   Sedentary   4 children, "blended family"   Social Determinants of Health   Financial Resource Strain: Not on file  Food Insecurity: No Food Insecurity (10/11/2022)   Hunger Vital Sign    Worried About Running Out of Food in the Last Year: Never true    Ran Out of Food in the Last Year: Never true  Transportation Needs: No Transportation Needs (10/11/2022)   PRAPARE - Transportation    Lack of Transportation (Medical): No    Lack of  Transportation (Non-Medical): No  Physical Activity: Not on file  Stress: Not on file  Social Connections: Not on file  Intimate Partner Violence: Not At Risk (10/11/2022)   Humiliation, Afraid, Rape, and Kick questionnaire    Fear of Current or Ex-Partner: No    Emotionally Abused: No    Physically Abused: No    Sexually Abused: No    Family History: Family History  Problem Relation Age of Onset   Hypertension Mother    Alzheimer's disease Mother    Stroke Mother    Heart attack Mother    Breast cancer Sister    Hypertension Other    Heart disease Neg Hx    Colon cancer Neg Hx    Liver disease Neg Hx     Current Medications:  Current Outpatient Medications:    atorvastatin (LIPITOR) 80 MG tablet, TAKE 1 TABLET BY MOUTH EVERY DAY FOR HIGH CHOLESTEROL, Disp: 90 tablet, Rfl: 3   Cholecalciferol (VITAMIN D3) 50 MCG (2000 UT) capsule, Take 1 capsule (2,000 Units total) by mouth daily., Disp: 90 capsule, Rfl: 1   Continuous Glucose Sensor (FREESTYLE LIBRE 2 SENSOR) MISC, FOR CONTINUOUS GLUCOSE MONITORING., Disp: 2 each, Rfl: 11   Continuous Glucose Sensor (FREESTYLE LIBRE 2 SENSOR) MISC, Use daily, Disp: 1 each, Rfl: 3   cyanocobalamin (VITAMIN B12) 500 MCG tablet, Take 1 tablet (500 mcg total) by mouth daily., Disp: 90 tablet, Rfl: 3   dicyclomine (BENTYL) 10 MG capsule, TAKE 1 CAPSULE BY MOUTH UP TO FOUR TIMES DAILY BEFORE MEALS AND AT BEDTIME (STOP  IF CONSTIPATION), Disp: 120 capsule, Rfl: 3   donepezil (ARICEPT) 5 MG tablet, TAKE 1 TABLET BY MOUTH AT BEDTIME FOR dementia, Disp: 90 tablet, Rfl: 3   ELIQUIS 5 MG TABS tablet, TAKE 1 TABLET BY MOUTH TWICE DAILY AS A BLOOD THINNER, Disp: 180 tablet, Rfl: 3   empagliflozin (JARDIANCE) 10 MG TABS tablet, Take 1 tablet (10 mg total) by mouth daily., Disp: 30 tablet, Rfl: 3   furosemide (LASIX) 40 MG tablet, TAKE 1 TABLET BY MOUTH DAILY FOR FLUID, Disp: 90 tablet, Rfl: 3   gabapentin (NEURONTIN) 100 MG capsule, TAKE ONE CAPSULE BY MOUTH THREE TIMES DAILY FOR NEUROPATHIC PAIN, Disp: 270 capsule, Rfl: 3   insulin glargine (LANTUS) 100 UNIT/ML injection, Inject 0.15 mLs (15 Units total) into the skin daily. (Patient taking differently: Inject 20-24 Units into the skin at bedtime.), Disp: 10 mL, Rfl: 11   ipratropium-albuterol (DUONEB) 0.5-2.5 (3) MG/3ML SOLN, Take 3 mLs by nebulization every 4 (four) hours as needed (Asthma)., Disp: , Rfl:    levETIRAcetam (KEPPRA) 500 MG tablet, TAKE 1 TABLET BY MOUTH TWICE DAILY FOR SEIZURE PREVENTION, Disp: 180 tablet, Rfl: 3   levothyroxine (SYNTHROID) 200 MCG tablet, Take 1 tablet (200 mcg total) by mouth daily., Disp: 90 tablet, Rfl: 1   loperamide (IMODIUM) 2 MG capsule, Take by mouth., Disp: , Rfl:    LORazepam (ATIVAN) 1 MG tablet, TAKE 1/2 TO 1 TABLET BY MOUTH AT BEDTIME FOR SLEEP, Disp: 30 tablet, Rfl: 2   melatonin 3 MG TABS tablet, TAKE 1 TABLET BY MOUTH AT BEDTIME AS NEED FOR SLEEP, Disp: 100 tablet, Rfl: 3   metoprolol tartrate (LOPRESSOR) 25 MG tablet, TAKE 1 TABLET BY MOUTH TWICE DAILY FOR HIGH BLOOD PRESSURE OR HEART RATE CONTROL, Disp: 180 tablet, Rfl: 3   Multiple Vitamins-Minerals (MULTIVITAMIN WITH MINERALS) tablet, Take 1 tablet by mouth daily. For Diabetics, Disp: , Rfl:    nitroGLYCERIN (NITROSTAT) 0.4 MG SL  tablet, , Disp: , Rfl:    NOVOLIN R FLEXPEN RELION 100 UNIT/ML FlexPen, Inject 2-5 Units into the skin 3 (three) times daily before meals.  Per sliding scale, Disp: , Rfl:    pantoprazole (PROTONIX) 40 MG tablet, TAKE 1 TABLET BY MOUTH TWICE DAILY FOR acid reflux, Disp: 180 tablet, Rfl: 3   potassium chloride (KLOR-CON) 10 MEQ tablet, Take 20 mEq by mouth daily., Disp: , Rfl:    sertraline (ZOLOFT) 25 MG tablet, TAKE 1 TABLET BY MOUTH DAILY FOR depression, Disp: 90 tablet, Rfl: 3   traMADol (ULTRAM) 50 MG tablet, Take 1 tablet (50 mg total) by mouth once as needed for up to 1 dose for moderate pain or severe pain., Disp: 90 tablet, Rfl: 2   trolamine salicylate (ASPERCREME) 10 % cream, Apply 1 application. topically as needed (knee pain)., Disp: , Rfl:    Allergies: Allergies  Allergen Reactions   Codeine Nausea And Vomiting and Other (See Comments)    HALLUCINATIONS/  Tolerates Tramadol      Dilaudid [Hydromorphone Hcl] Other (See Comments)    Made her pass out/ Tolerates Tramadol     Reglan [Metoclopramide] Other (See Comments)    DYSKINESIA    Hyoscyamine Other (See Comments)    MADE DIARRHEA WORSE    REVIEW OF SYSTEMS:   Review of Systems  Constitutional:  Positive for fatigue.  Respiratory:  Positive for cough and shortness of breath.   Cardiovascular:  Positive for leg swelling.  Musculoskeletal:  Positive for arthralgias (knee pain).  Neurological:  Positive for numbness.  Psychiatric/Behavioral:  Positive for sleep disturbance.      VITALS:   There were no vitals taken for this visit.  Wt Readings from Last 3 Encounters:  02/20/23 175 lb (79.4 kg)  02/14/23 175 lb 6.4 oz (79.6 kg)  01/19/23 183 lb 6.4 oz (83.2 kg)    There is no height or weight on file to calculate BMI.   PHYSICAL EXAM:   Physical Exam Constitutional:      Appearance: Normal appearance.  Cardiovascular:     Rate and Rhythm: Normal rate and regular rhythm.  Pulmonary:     Effort: Pulmonary effort is normal.     Breath sounds: Normal breath sounds.     Comments: Trach in place. On oxygen.  Abdominal:     General: Bowel  sounds are normal.     Palpations: Abdomen is soft.  Musculoskeletal:        General: No swelling. Normal range of motion.  Neurological:     Mental Status: She is alert and oriented to person, place, and time. Mental status is at baseline.     LABS:      Latest Ref Rng & Units 03/16/2023    1:51 PM 12/28/2022   10:43 AM 12/23/2022    2:00 PM  CBC  WBC 4.0 - 10.5 K/uL 6.4  4.7  5.5   Hemoglobin 12.0 - 15.0 g/dL 40.9  81.1  91.4   Hematocrit 36.0 - 46.0 % 37.0  34.6  34.9   Platelets 150 - 400 K/uL 152  100  166       Latest Ref Rng & Units 12/28/2022   10:43 AM 10/11/2022    2:30 PM 11/17/2021    7:23 AM  CMP  Glucose 70 - 99 mg/dL 782  956  213   BUN 8 - 27 mg/dL 33  40  18   Creatinine 0.57 - 1.00 mg/dL 0.86  5.78  4.69  Sodium 134 - 144 mmol/L 139  138  140   Potassium 3.5 - 5.2 mmol/L 4.0  4.2  3.8   Chloride 96 - 106 mmol/L 106  107  111   CO2 20 - 29 mmol/L 21  24  23    Calcium 8.7 - 10.3 mg/dL 9.2  9.2  9.3   Total Protein 6.0 - 8.5 g/dL 6.9  7.1    Total Bilirubin 0.0 - 1.2 mg/dL 0.5  0.7    Alkaline Phos 44 - 121 IU/L 89  83    AST 0 - 40 IU/L 22  22    ALT 0 - 32 IU/L 20  20       No results found for: "CEA1", "CEA" / No results found for: "CEA1", "CEA" No results found for: "PSA1" No results found for: "CAN199" No results found for: "CAN125"  Lab Results  Component Value Date   TOTALPROTELP 7.0 10/11/2022   TOTALPROTELP 6.7 10/11/2022   ALBUMINELP 3.8 10/11/2022   A1GS 0.2 10/11/2022   A2GS 0.7 10/11/2022   BETS 1.0 10/11/2022   GAMS 1.3 10/11/2022   MSPIKE Not Observed 10/11/2022   SPEI Comment 10/11/2022   Lab Results  Component Value Date   TIBC 261 03/16/2023   TIBC 274 12/23/2022   TIBC 393 10/11/2022   FERRITIN 90 03/16/2023   FERRITIN 162 12/23/2022   FERRITIN 13 10/11/2022   IRONPCTSAT 16 03/16/2023   IRONPCTSAT 36 (H) 12/23/2022   IRONPCTSAT 11 10/11/2022   Lab Results  Component Value Date   LDH 157 10/11/2022   LDH 157  05/11/2018     STUDIES:   VAS [place] ABI WITH/WO TBI  Result Date: 03/02/2023  LOWER EXTREMITY DOPPLER STUDY Patient Name:  WILDER MOSSA  Date of Exam:   03/01/2023 Medical Rec #: [id number]      Accession #:    [id number] Date of Birth: 1951/04/11      Patient Gender: O Patient Age:   77 years Exam Location:  [facility] Procedure:      VAS [place] ABI WITH/WO TBI Referring Phys: Lorine Bears --------------------------------------------------------------------------------  Indications: Patienr has visible varicosities in both feet and ankles. When feet              are hanging the feet will turn a bluish color. She denies              claudication and rest pain symptoms. Patient has had diabetes for              40+ years. High Risk         Hypertension, hyperlipidemia, Diabetes, past history of Factors:          smoking.  Performing Technologist: Carlos American RVT  Examination Guidelines: A complete evaluation includes at minimum, Doppler waveform signals and systolic blood pressure reading at the level of bilateral brachial, anterior tibial, and posterior tibial arteries, when vessel segments are accessible. Bilateral testing is considered an integral part of a complete examination. Photoelectric Plethysmograph (PPG) waveforms and toe systolic pressure readings are included as required and additional duplex testing as needed. Limited examinations for reoccurring indications may be performed as noted.  ABI Findings: +---------+------------------+-----+---------------+--------+ Right    Rt Pressure (mmHg)IndexWaveform       Comment  +---------+------------------+-----+---------------+--------+ Brachial 114                                            +---------+------------------+-----+---------------+--------+  PTA      254               1.97 barely biphasic         +---------+------------------+-----+---------------+--------+ PERO     254               1.97 triphasic                +---------+------------------+-----+---------------+--------+ DP       254               1.97 triphasic               +---------+------------------+-----+---------------+--------+ Great Toe78                0.60 Normal                  +---------+------------------+-----+---------------+--------+ +---------+------------------+-----+----------------+-------+ Left     Lt Pressure (mmHg)IndexWaveform        Comment +---------+------------------+-----+----------------+-------+ Brachial 129                                            +---------+------------------+-----+----------------+-------+ PTA      254               1.97 multiphasic             +---------+------------------+-----+----------------+-------+ PERO     254               2.23 triphasic               +---------+------------------+-----+----------------+-------+ DP       254               1.97 barely triphasic        +---------+------------------+-----+----------------+-------+ Great Toe85                0.66 Normal                  +---------+------------------+-----+----------------+-------+ +-------+-----------+-----------+------------+------------+ ABI/TBIToday's ABIToday's TBIPrevious ABIPrevious TBI +-------+-----------+-----------+------------+------------+ Right  N/C        .60                                 +-------+-----------+-----------+------------+------------+ Left   N/C        .66                                 +-------+-----------+-----------+------------+------------+   Summary: Right: Resting right ankle-brachial index indicates noncompressible right lower extremity arteries. The right toe-brachial index is abnormal. Left: Resting left ankle-brachial index indicates noncompressible left lower extremity arteries. The left toe-brachial index is abnormal. *See table(s) above for measurements and observations.  Electronically signed by Lorine Bears MD on 03/02/2023 at 1:11:55  PM.    Final       ASSESSMENT & PLAN:   Assessment:  1.  Normocytic anemia: - She had a history of iron deficiency anemia in the past, status post Injectafer, last given in November 2019. - She had 2 units of blood transfusion during recent hospitalization. - She had tracheotomy done as she cannot tolerate CPAP machine for sleep apnea. - No rectal bleeding.   2.  Social/family history: - She is seen with her daughter [name] who is her caregiver.  Patient walks with the  help of walker.  She is able to dress herself.  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  Quit smoking in 1994. - Sister had breast cancer.  Another sister had leukemia.  Maternal uncle had stomach cancer.  Plan:  1.  Normocytic anemia: -Received 1 g Monoferric on 11/02/2022 with improvement of her symptoms as well as labs. - Reviewed labs from 03/16/2023 which showed a ferritin of 90 (162), iron saturation 16% (36%), vitamin B12 1152 and a hemoglobin of 11.8. -Continues to deny any overt bleeding. -B12 level has decreased to 1152.  -Given significant fatigue and drop in her counts, would recommend a dose of IV Feraheme.  Recommend restarting B12 every other day. -Follow-up in 3 months with labs a few days before and virtual visit.  2. General health Maintenance: -Mammogram from 10/26/2022 showed BI-RADS Category 0 due to calcifications. -Repeat mammogram from 11/08/2022 was read as BI-RADS Category 1 negative.  Patient expressed understanding of the plan provided.   PLAN SUMMARY: >> Return to clinic in 12 weeks for follow-up with labs (CBC with differential, ferritin, iron saturation, vitamin B-12 level and CMP) and virtual visit. >> Restart B12 supplements every other day.      I spent 25 minutes dedicated to the care of this patient (face-to-face and non-face-to-face) on the date of the encounter to include what is  described in the assessment and plan.   Durenda Hurt, NP 03/23/2023 2:42 PM

## 2023-03-28 ENCOUNTER — Encounter: Payer: Self-pay | Admitting: Neurology

## 2023-03-28 ENCOUNTER — Telehealth: Payer: Self-pay | Admitting: Neurology

## 2023-03-28 ENCOUNTER — Ambulatory Visit: Payer: PPO | Admitting: Neurology

## 2023-03-28 VITALS — BP 122/82 | HR 84 | Resp 19 | Ht 66.0 in | Wt 175.9 lb

## 2023-03-28 DIAGNOSIS — F03B0 Unspecified dementia, moderate, without behavioral disturbance, psychotic disturbance, mood disturbance, and anxiety: Secondary | ICD-10-CM

## 2023-03-28 DIAGNOSIS — G40909 Epilepsy, unspecified, not intractable, without status epilepticus: Secondary | ICD-10-CM

## 2023-03-28 DIAGNOSIS — R269 Unspecified abnormalities of gait and mobility: Secondary | ICD-10-CM

## 2023-03-28 NOTE — Progress Notes (Signed)
Chief Complaint  Patient presents with   New Patient (Initial Visit)    Rm14, daughter denise, [place] was:19, NP Internal referral for seizures: 2008 was the last one, extrapyramidal and movement disorder, mild cognitive impairment       ASSESSMENT AND PLAN  [name] is a 72 y.o. female   Dementia  MoCA examination 19/30 today  Family history of memory loss, multiple vascular risk factor,  Complete evaluation with MRI of the brain History of seizure,  Presenting with staring spells, loss of muscle tone, fall,  Much improved since she was started on Keppra 500 twice daily, recent spell of sudden frozen, confusion in the middle of the walking Differentiation diagnosis include orthostatic blood pressure changes, versus recurrent partial seizure Repeat EEG Gait abnormality  Length-dependent sensory changes distal leg weakness,  Could due to diabetic peripheral neuropathy, with superimposed lumbar radiculopathy  Return To Clinic With NP In 6 Months   DIAGNOSTIC DATA (LABS, IMAGING, TESTING) - I reviewed patient records, labs, notes, testing and imaging myself where available.   MEDICAL HISTORY:  [name], is a 72 year old female, accompanied by her daughter seen in request by her primary care nurse practitioner from [place], [place], for evaluation of memory loss, gait abnormality, history of partial seizure, initial evaluation March 28, 2023  I reviewed and summarized the referring note. PMHx S/p tracheostomy July 2022, ventilator at night HLD HTN OSA,  Quit smoke in 1993 A fib,  CAD DM Lumbar decompressoin surgery in the past Seizure.   Seizure: Prior to 2011, she was having frequent spells of sudden loss of muscle tone, fall, about once a month, had 1 episode that witnessed by her beautician, describes staring spells, seizure-like activity, was seen by local neurologist Dr. Gerilyn Pilgrim, put on Keppra 500 mg twice daily, did very well, until  6 months ago, she was walking with a walker, suddenly froze in the middle, confused, did not know which direction to go, recovering couple minutes, she has been compliant with her Keppra  Memory loss: She is a retired Marketing executive for [employer], father had Alzheimer's at age 49s She lives with her husband and daughter at home, few years ago, she has developed gradual worsening memory loss, worsening confusion in the setting of chronic hypoxic respiratory failure, BiPAP intolerance, eventually had tracheostomy in 2022, now using oxygen during the day, ventilator at nighttime, since then, her mentation has much improved, she can organize her medications, carry on  normal conversation, MoCA is 19/30,   CT head in July 2022 showed generalized atrophy periventricular small vessel disease  Laboratory showed normal B12, slight elevation of TSH 5.4, on thyroid supplement  Gait abnormality: Multiple lumbar decompression surgery in the past, baseline chronic low back pain, knee pain, rely on her walker, length-dependent sensory changes, mild to moderate distal leg weakness on examination   PHYSICAL EXAM:   Vitals:   03/28/23 1531  BP: 122/82  Pulse: 84  Resp: 19  Weight: 175 lb 14.8 oz (79.8 kg)  Height: 5\' 6"  (1.676 m)    Body mass index is 28.4 kg/m.  PHYSICAL EXAMNIATION:  Gen: NAD, conversant, well nourised, well groomed                     Cardiovascular: Regular rate rhythm, no peripheral edema, warm, nontender. Eyes: Conjunctivae clear without exudates or hemorrhage Neck: Supple, no carotid bruits. Pulmonary: Clear to auscultation bilaterally   NEUROLOGICAL EXAM:  MENTAL STATUS: Speech/cognition:  Sitting in wheelchair, status post tracheostomy, awake, alert, oriented to history taking and casual conversation    03/28/2023    3:41 PM  Montreal Cognitive Assessment   Visuospatial/ Executive (0/5) 3  Naming (0/3) 2  Attention: Read list of digits (0/2) 2  Attention:  Read list of letters (0/1) 0  Attention: Serial 7 subtraction starting at 100 (0/3) 3  Language: Repeat phrase (0/2) 2  Language : Fluency (0/1) 0  Abstraction (0/2) 1  Delayed Recall (0/5) 0  Orientation (0/6) 6  Total 19    CRANIAL NERVES: CN II: Visual fields are full to confrontation. Pupils are round equal and briskly reactive to light. CN III, IV, VI: extraocular movement are normal. No ptosis. CN V: Facial sensation is intact to light touch CN VII: Face is symmetric with normal eye closure  CN VIII: Hearing is normal to causal conversation. CN IX, X: Phonation is normal. CN XI: Head turning and shoulder shrug are intact  MOTOR: Mild to moderate bilateral ankle dorsiflexion weakness  REFLEXES: Reflexes are 1 and symmetric at the biceps, triceps, knees, and absent at ankles. Plantar responses are flexor.  SENSORY: Length-dependent decreased light touch, pinprick, vibratory sensation to distal shin level  COORDINATION: There is no trunk or limb dysmetria noted.  GAIT/STANCE: Push-up to get up from seated position, difficulty clear fluid from floor, unsteady,  REVIEW OF SYSTEMS:  Full 14 system review of systems performed and notable only for as above All other review of systems were negative.   ALLERGIES: Allergies  Allergen Reactions   Codeine Nausea And Vomiting and Other (See Comments)    HALLUCINATIONS/  Tolerates Tramadol      Dilaudid [Hydromorphone Hcl] Other (See Comments)    Made her pass out/ Tolerates Tramadol     Reglan [Metoclopramide] Other (See Comments)    DYSKINESIA    Hyoscyamine Other (See Comments)    MADE DIARRHEA WORSE    HOME MEDICATIONS: Current Outpatient Medications  Medication Sig Dispense Refill   atorvastatin (LIPITOR) 80 MG tablet TAKE 1 TABLET BY MOUTH EVERY DAY FOR HIGH CHOLESTEROL 90 tablet 3   Cholecalciferol (VITAMIN D3) 50 MCG (2000 UT) capsule Take 1 capsule (2,000 Units total) by mouth daily. 90 capsule 1   Continuous  Glucose Sensor (FREESTYLE LIBRE 2 SENSOR) MISC FOR CONTINUOUS GLUCOSE MONITORING. 2 each 11   Continuous Glucose Sensor (FREESTYLE LIBRE 2 SENSOR) MISC Use daily 1 each 3   cyanocobalamin (VITAMIN B12) 500 MCG tablet Take 1 tablet (500 mcg total) by mouth daily. (Patient taking differently: Take 500 mcg by mouth every other day.) 90 tablet 3   dicyclomine (BENTYL) 10 MG capsule TAKE 1 CAPSULE BY MOUTH UP TO FOUR TIMES DAILY BEFORE MEALS AND AT BEDTIME (STOP IF CONSTIPATION) 120 capsule 3   donepezil (ARICEPT) 5 MG tablet TAKE 1 TABLET BY MOUTH AT BEDTIME FOR dementia 90 tablet 3   DULoxetine (CYMBALTA) 30 MG capsule TAKE 1 CAPSULE BY MOUTH TWICE DAILY FOR mood AND pain 180 capsule 3   ELIQUIS 5 MG TABS tablet TAKE 1 TABLET BY MOUTH TWICE DAILY AS A BLOOD THINNER 180 tablet 3   empagliflozin (JARDIANCE) 10 MG TABS tablet Take 1 tablet (10 mg total) by mouth daily. 30 tablet 3   furosemide (LASIX) 40 MG tablet TAKE 1 TABLET BY MOUTH DAILY FOR FLUID 90 tablet 3   gabapentin (NEURONTIN) 100 MG capsule TAKE ONE CAPSULE BY MOUTH THREE TIMES DAILY FOR NEUROPATHIC PAIN 270 capsule 3   insulin glargine (  LANTUS) 100 UNIT/ML injection Inject 0.15 mLs (15 Units total) into the skin daily. (Patient taking differently: Inject 20-24 Units into the skin at bedtime.) 10 mL 11   ipratropium-albuterol (DUONEB) 0.5-2.5 (3) MG/3ML SOLN Take 3 mLs by nebulization every 4 (four) hours as needed (Asthma).     levETIRAcetam (KEPPRA) 500 MG tablet TAKE 1 TABLET BY MOUTH TWICE DAILY FOR SEIZURE PREVENTION 180 tablet 3   levothyroxine (SYNTHROID) 200 MCG tablet Take 1 tablet (200 mcg total) by mouth daily. 90 tablet 1   loperamide (IMODIUM) 2 MG capsule Take by mouth.     LORazepam (ATIVAN) 1 MG tablet TAKE 1/2 TO 1 TABLET BY MOUTH AT BEDTIME FOR SLEEP 30 tablet 2   melatonin 3 MG TABS tablet TAKE 1 TABLET BY MOUTH AT BEDTIME AS NEED FOR SLEEP 100 tablet 3   metoprolol tartrate (LOPRESSOR) 25 MG tablet TAKE 1 TABLET BY MOUTH  TWICE DAILY FOR HIGH BLOOD PRESSURE OR HEART RATE CONTROL 180 tablet 3   Multiple Vitamins-Minerals (MULTIVITAMIN WITH MINERALS) tablet Take 1 tablet by mouth daily. For Diabetics     nitroGLYCERIN (NITROSTAT) 0.4 MG SL tablet      NOVOLIN R FLEXPEN RELION 100 UNIT/ML FlexPen Inject 2-5 Units into the skin 3 (three) times daily before meals. Per sliding scale     pantoprazole (PROTONIX) 40 MG tablet TAKE 1 TABLET BY MOUTH TWICE DAILY FOR acid reflux 180 tablet 3   sertraline (ZOLOFT) 25 MG tablet TAKE 1 TABLET BY MOUTH DAILY FOR depression 90 tablet 3   traMADol (ULTRAM) 50 MG tablet Take 1 tablet (50 mg total) by mouth once as needed for up to 1 dose for moderate pain or severe pain. 90 tablet 2   trolamine salicylate (ASPERCREME) 10 % cream Apply 1 application. topically as needed (knee pain).     potassium chloride (KLOR-CON) 10 MEQ tablet Take 1 tablet (10 mEq total) by mouth once for 1 dose. 90 tablet 1   No current facility-administered medications for this visit.    PAST MEDICAL HISTORY: Past Medical History:  Diagnosis Date   Abnormal pulmonary function test    Anemia    H/H of 10/30 with a normal MCV in 12/09   Anxiety    Arthritis    Barrett's esophagus    Diagnosed 1995. Last EGD 2016-NO BARRETT'S.    Chest pain    a. 2022 Cath: nl cors; b. 2008 Neg myoview; c. 01/2014 Cath: Nl cors. EF 40%; c. 08/2020 NSTEMI/Cath [facility]): nl cors.   Chronic anticoagulation    Chronic combined systolic and diastolic CHF (congestive heart failure) (HCC)    a. 10/2014 Echo: EF40%; b. 01/2015 Echo: EF 55-60%; c. 11/2020 Echo: EF 55-60%, mod LVH, sev BAE, mild MR, mod TR. PASP ; d. 01/2021 Echo: EF 45-50%, glob HK, mod conc LVH, mod reduced RV fxn, RVSP 60.56mmHg, Mod TR, mild to mod MR, sev dil LA.   Chronic LBP    Surgical intervention in 1996   Diabetes mellitus, type 2 (HCC)    Insulin therapy; exacerbated by prednisone   Gastroparesis    99% retention 05/2008 on GES   GERD (gastroesophageal  reflux disease)    Heart attack (HCC) 08/13/2020   Hiatal hernia    Hyperlipidemia    Hypertension    Hypothyroid    IBS (irritable bowel syndrome)    Obesity    OSA on CPAP    had CPAP and cannot tolerate.   Paroxysmal atrial fibrillation (HCC)  a.CHA2DS2VASc = 6-->eliquis. Also on Amio.   Pulmonary hypertension (HCC) 01/2015   a. 01/2015 RHC: Predominantly pulmonary venous hypertension but may be component of PAH (PA mean 52, PCWP 31); b. 01/2021 Echo: RVSP 60.41mmHg w/ mod reduced RV fxn.   Seizures (HCC)    last seizure was 2 years ago; on keppra for this; unknown etiology   Syncope    a. Admitted 05/2009; magnetic resonance imagin/ MRA - negative; etiology thought to be orthostasis secondary to drugs and dehydration. b. Syncope 02/2015 also felt 2/2 dehydration.    PAST SURGICAL HISTORY: Past Surgical History:  Procedure Laterality Date   BACK SURGERY  1996   BIOPSY N/A 11/08/2013   Procedure: BIOPSY  / Tissue sampling / ulcers present in small intestine;  Surgeon: West Bali, MD;  [facility];  Service: Endoscopy;  Laterality: N/A;   CARDIAC CATHETERIZATION  2002   CARDIAC CATHETERIZATION N/A 01/26/2015   Procedure: Right Heart Cath;  Surgeon: Laurey Morale, MD;  Location: [facility] INVASIVE CV LAB;  Service: Cardiovascular;  Laterality: N/A;   CARDIOVASCULAR STRESS TEST  2008   Stress nuclear study   CARDIOVERSION N/A 03/06/2015   Procedure: CARDIOVERSION;  Surgeon: Laurey Morale, MD;  Location: [facility] ENDOSCOPY;  Service: Cardiovascular;  Laterality: N/A;   CARPAL TUNNEL RELEASE  1994   CATARACT EXTRACTION W/PHACO Right 01/10/2022   Procedure: CATARACT EXTRACTION PHACO AND INTRAOCULAR LENS PLACEMENT (IOC);  Surgeon: [name], MD;  Location: [facility] ORS;  Service: Ophthalmology;  Laterality: Right;  CDE: 21.51   CATARACT EXTRACTION W/PHACO Left 01/24/2022   Procedure: CATARACT EXTRACTION PHACO AND INTRAOCULAR LENS PLACEMENT (IOC);  Surgeon: [name], MD;   Location: [facility] ORS;  Service: Ophthalmology;  Laterality: Left;  CDE: 17.13   COLONOSCOPY  11/2011   Dr. Darrick Penna: Internal hemorrhoids, mild diverticulosis. Random colon biopsies negative.   COLONOSCOPY N/A 11/08/2013   SLF: Normal mucosa in the terminal ileum/The colon IS redundant/  Moderate diverticulosis throughout the entire colon. ileum bx benign. colon bx benign   CRYOTHERAPY  01/23/2021   Procedure: CRYOTHERAPY;  Surgeon: Lorin Glass, MD;  Location: [facility] ENDOSCOPY;  Service: Pulmonary;;   CRYOTHERAPY  01/24/2021   Procedure: Cloyde Reams;  Surgeon: Lorin Glass, MD;  Location: [facility] ENDOSCOPY;  Service: Pulmonary;;   CRYOTHERAPY  01/26/2021   Procedure: Cloyde Reams;  Surgeon: Lupita Leash, MD;  Location: [facility] ENDOSCOPY;  Service: Cardiopulmonary;;   ESOPHAGOGASTRODUODENOSCOPY  2008   Barrett's without dysplasia. esphagus dilated. antral erosions, h.pylori serologies negative.   ESOPHAGOGASTRODUODENOSCOPY  11/2011   Dr. Darrick Penna: Barrett's esophagus, mild gastritis, diverticulum in the second portion of the duodenum repeat EGD 3 years. Small bowel biopsies negative. Gastric biopsy show reactive gastropathy but no H. pylori. Esophageal biopsies consistent with GERD. Next EGD 11/2014   ESOPHAGOGASTRODUODENOSCOPY N/A 11/21/2014   ZOX:WRUE non-erosive gastritis/irregular z-line   ESOPHAGOGASTRODUODENOSCOPY  03/2022   FOREIGN BODY REMOVAL  01/23/2021   Procedure: FOREIGN BODY REMOVAL;  Surgeon: Lorin Glass, MD;  Location: [facility] ENDOSCOPY;  Service: Pulmonary;;   FOREIGN BODY REMOVAL  01/24/2021   Procedure: FOREIGN BODY REMOVAL;  Surgeon: Lorin Glass, MD;  Location: [facility] ENDOSCOPY;  Service: Pulmonary;;   GIVENS CAPSULE STUDY  12/07/2011   Proximal small bowel, rare AVM. Distal small bowel, multiple ulcers noted   GIVENS CAPSULE STUDY N/A 09/27/2013   Distal small bowel ulcers extending to TI.   GIVENS CAPSULE STUDY N/A 10/10/2013   Procedure: GIVENS CAPSULE STUDY;  Surgeon: West Bali,  MD;  [facility]: [facility];  Service: Endoscopy;  Laterality: N/A;  7:30   HEMOSTASIS CONTROL  01/24/2021   Procedure: HEMOSTASIS CONTROL;  Surgeon: Lorin Glass, MD;  Location: [facility] ENDOSCOPY;  Service: Pulmonary;;   HEMOSTASIS CONTROL  01/26/2021   Procedure: HEMOSTASIS CONTROL;  Surgeon: Lupita Leash, MD;  Location: [facility] ENDOSCOPY;  Service: Cardiopulmonary;;   IR GASTROSTOMY TUBE MOD SED  01/11/2021   IR GASTROSTOMY TUBE REMOVAL  04/13/2021   IR KYPHO THORACIC WITH BONE BIOPSY  02/09/2018   KNEE ARTHROSCOPY WITH MEDIAL MENISECTOMY Right 06/09/2016   Procedure: KNEE ARTHROSCOPY WITH MEDIAL MENISECTOMY;  Surgeon: Vickki Hearing, MD;  Location: [facility] ORS;  Service: Orthopedics;  Laterality: Right;  medial and lateral menisectomy   LAMINECTOMY  1995   L4-L5   LAPAROSCOPIC CHOLECYSTECTOMY  1990s   LEFT HEART CATHETERIZATION WITH CORONARY ANGIOGRAM  01/10/2014   Procedure: LEFT HEART CATHETERIZATION WITH CORONARY ANGIOGRAM;  Surgeon: Lesleigh Noe, MD;  Location: [facility] CATH LAB;  Service: Cardiovascular;;   MICROLARYNGOSCOPY WITH LASER AND BALLOON DILATION N/A 11/17/2021   Procedure: MICROLARYNGOSCOPY WITH CO2 LASER, SUBGLOTTIC LARYNGEAL MASS BIOSPY;  Surgeon: Serena Colonel, MD;  Location: [facility] OR;  Service: ENT;  Laterality: N/A;   RIGHT HEART CATHETERIZATION N/A 01/10/2014   Procedure: RIGHT HEART CATH;  Surgeon: Lesleigh Noe, MD;  Location: [facility] CATH LAB;  Service: Cardiovascular;  Laterality: N/A;   TOTAL ABDOMINAL HYSTERECTOMY  1999   TRACHEOSTOMY REVISION N/A 07/18/2021   Procedure: TRACHEOSTOMY REVISION;  Surgeon: Suzanna Obey, MD;  Location[facility] OR;  Service: ENT;  Laterality: N/A;   TRACHEOSTOMY TUBE PLACEMENT N/A 09/01/2017   Procedure: TRACHEOSTOMY;  Surgeon: Drema Halon, MD;  Location: [facility] OR;  Service: ENT;  Laterality: N/A;   VIDEO BRONCHOSCOPY Bilateral 01/23/2021   Procedure: VIDEO BRONCHOSCOPY WITHOUT FLUORO;  Surgeon: Lorin Glass, MD;  Location: [facility]  ENDOSCOPY;  Service: Pulmonary;  Laterality: Bilateral;   VIDEO BRONCHOSCOPY Bilateral 01/24/2021   Procedure: VIDEO BRONCHOSCOPY WITHOUT FLUORO;  Surgeon: Lorin Glass, MD;  Location: [name] ENDOSCOPY;  Service: Pulmonary;  Laterality: Bilateral;   VIDEO BRONCHOSCOPY N/A 01/26/2021   Procedure: VIDEO BRONCHOSCOPY WITHOUT FLUORO;  Surgeon: Lupita Leash, MD;  Location: [facility] ENDOSCOPY;  Service: Cardiopulmonary;  Laterality: N/A;    FAMILY HISTORY: Family History  Problem Relation Age of Onset   Hypertension Mother    Alzheimer's disease Mother    Stroke Mother    Heart attack Mother    Breast cancer Sister    Hypertension Other    Heart disease Neg Hx    Colon cancer Neg Hx    Liver disease Neg Hx     SOCIAL HISTORY: Social History   Socioeconomic History   Marital status: Married    Spouse name: Not on file   Number of children: Not on file   Years of education: Not on file   Highest education level: Not on file  Occupational History   Occupation: Passenger transport manager at [employer]    Employer: [employer]    Comment: disabled    Employer: RETIRED  Tobacco Use   Smoking status: Former    Current packs/day: 0.00    Average packs/day: 0.3 packs/day for 27.3 years (6.8 ttl pk-yrs)    Types: Cigarettes    Start date: 02/26/1966    Quit date: 06/30/1993    Years since quitting: 29.7   Smokeless tobacco: Never   Tobacco comments:    quit in 1984  Vaping Use  Vaping status: Never Used  Substance and Sexual Activity   Alcohol use: Not Currently    Comment: Occasional alcohol 30 years ago (09/02/21)   Drug use: No   Sexual activity: Yes    Birth control/protection: None, Surgical  Other Topics Concern   Not on file  Social History Narrative   Sedentary   4 children, "blended family"   Social Determinants of Health   Financial Resource Strain: Not on file  Food Insecurity: No Food Insecurity (10/11/2022)   Hunger Vital Sign    Worried About Running Out of Food in the Last Year:  Never true    Ran Out of Food in the Last Year: Never true  Transportation Needs: No Transportation Needs (10/11/2022)   PRAPARE - Administrator, Civil Service (Medical): No    Lack of Transportation (Non-Medical): No  Physical Activity: Not on file  Stress: Not on file  Social Connections: Not on file  Intimate Partner Violence: Not At Risk (10/11/2022)   Humiliation, Afraid, Rape, and Kick questionnaire    Fear of Current or Ex-Partner: No    Emotionally Abused: No    Physically Abused: No    Sexually Abused: No      Levert Feinstein, M.D. Ph.D.  [facility] [address] [place], [place] [address] Ph: [phone] Fax: [phone]  CC:  Rica Records, FNP [phone] [address] [place],  [place] [address]  Del Nigel Berthold, FNP

## 2023-03-28 NOTE — Telephone Encounter (Signed)
[place] adv NPR sent to GI [phone]

## 2023-03-29 ENCOUNTER — Ambulatory Visit (INDEPENDENT_AMBULATORY_CARE_PROVIDER_SITE_OTHER): Payer: PPO | Admitting: Family Medicine

## 2023-03-29 VITALS — BP 98/54 | HR 52 | Ht 66.0 in | Wt 176.0 lb

## 2023-03-29 DIAGNOSIS — I10 Essential (primary) hypertension: Secondary | ICD-10-CM | POA: Diagnosis not present

## 2023-03-29 DIAGNOSIS — E039 Hypothyroidism, unspecified: Secondary | ICD-10-CM

## 2023-03-29 DIAGNOSIS — E1159 Type 2 diabetes mellitus with other circulatory complications: Secondary | ICD-10-CM

## 2023-03-29 DIAGNOSIS — E782 Mixed hyperlipidemia: Secondary | ICD-10-CM | POA: Diagnosis not present

## 2023-03-29 DIAGNOSIS — R8290 Unspecified abnormal findings in urine: Secondary | ICD-10-CM

## 2023-03-29 DIAGNOSIS — Z794 Long term (current) use of insulin: Secondary | ICD-10-CM

## 2023-03-29 DIAGNOSIS — F03B18 Unspecified dementia, moderate, with other behavioral disturbance: Secondary | ICD-10-CM

## 2023-03-29 DIAGNOSIS — E119 Type 2 diabetes mellitus without complications: Secondary | ICD-10-CM

## 2023-03-29 DIAGNOSIS — L089 Local infection of the skin and subcutaneous tissue, unspecified: Secondary | ICD-10-CM

## 2023-03-29 MED ORDER — TRAMADOL HCL 50 MG PO TABS: 50.0000 mg | ORAL_TABLET | Freq: Three times a day (TID) | ORAL | 0 refills | Status: DC | PRN

## 2023-03-29 MED ORDER — CEPHALEXIN 500 MG PO CAPS
500.0000 mg | ORAL_CAPSULE | Freq: Two times a day (BID) | ORAL | 0 refills | Status: AC
Start: 2023-03-29 — End: 2023-04-03

## 2023-03-29 MED ORDER — MUPIROCIN 2 % EX OINT
1.0000 | TOPICAL_OINTMENT | Freq: Two times a day (BID) | CUTANEOUS | 1 refills | Status: DC
Start: 2023-03-29 — End: 2023-08-08

## 2023-03-29 MED ORDER — ATORVASTATIN CALCIUM 40 MG PO TABS: 40.0000 mg | ORAL_TABLET | Freq: Every day | ORAL | 3 refills | Status: DC

## 2023-03-29 NOTE — Assessment & Plan Note (Signed)
Last Hemoglobin A1C 9.1 not at a goal, Labs ordered today awaiting results will follow up. Patient reporting taking Jardiance 10 mg daily, Lantus 20-24 units daily at bedtime and Novolin Flexpen 2-5 units 3 time daily before meals per sliding scale.Discussed medication desired effects, potential side effects, and how to administer the medication. Nonpharmacological interventions such as low carb diet,high in protein, vegetables and fruit discussed. Educated on importance of physical activity 150 minutes per week. Discussed signs and symptoms of hypoglycemia, & hyperglycemia and need to present to the ED if symptoms occurs.Follow up in 3 months or sooner if needed. Patient verbalizes understanding regarding plan of care and all questions answered. Ophthalmology referral placed , Foot exam within desired limits

## 2023-03-29 NOTE — Progress Notes (Addendum)
Patient Office Visit   Subjective   Patient ID: [name], female    DOB: [birth year]  Age: 72 y.o. MRN: [id number]  CC:  Chief Complaint  Patient presents with   Hypertension    HPI [name] 72 year old female, presents to the clinic for chronic follow up. She  has a past medical history of Abnormal pulmonary function test, Anemia, Anxiety, Arthritis, Barrett's esophagus, Chest pain, Chronic anticoagulation, Chronic combined systolic and diastolic CHF (congestive heart failure) (HCC), Chronic LBP, Diabetes mellitus, type 2 (HCC), Gastroparesis, GERD (gastroesophageal reflux disease), Heart attack (HCC) (08/13/2020), Hiatal hernia, Hyperlipidemia, Hypertension, Hypothyroid, IBS (irritable bowel syndrome), Obesity, OSA on CPAP, Paroxysmal atrial fibrillation (HCC), Pulmonary hypertension (HCC) (01/2015), Seizures (HCC), and Syncope.  Diabetes She presents for her follow-up diabetic visit. She has type 2 diabetes mellitus. Her disease course has been fluctuating. Hypoglycemia symptoms include headaches. Pertinent negatives for hypoglycemia include no dizziness, nervousness/anxiousness or tremors. Associated symptoms include foot paresthesias. Pertinent negatives for diabetes include no chest pain, no foot ulcerations, no polydipsia, no polyphagia, no polyuria and no visual change. Pertinent negatives for hypoglycemia complications include no blackouts. Diabetic complications include peripheral neuropathy. Pertinent negatives for diabetic complications include no retinopathy. Risk factors for coronary artery disease include dyslipidemia, sedentary lifestyle and family history. Current diabetic treatment includes insulin injections and oral agent (dual therapy). She is compliant with treatment all of the time. Insulin injections are given by patient. Rotation sites for injection include the abdominal wall. Her weight is fluctuating minimally. She is following a generally healthy diet. Meal  planning includes avoidance of concentrated sweets and calorie counting. She has not had a previous visit with a dietitian. She rarely participates in exercise. Her home blood glucose trend is fluctuating minimally. Her breakfast blood glucose range is generally 110-130 mg/dl. Her lunch blood glucose range is generally 180-200 mg/dl. Her bedtime blood glucose range is generally 140-180 mg/dl. She does not see a podiatrist.Eye exam is not current.      Outpatient Encounter Medications as of 03/29/2023  Medication Sig   atorvastatin (LIPITOR) 40 MG tablet Take 1 tablet (40 mg total) by mouth daily.   cephALEXin (KEFLEX) 500 MG capsule Take 1 capsule (500 mg total) by mouth 2 (two) times daily for 5 days.   Cholecalciferol (VITAMIN D3) 50 MCG (2000 UT) capsule Take 1 capsule (2,000 Units total) by mouth daily.   Continuous Glucose Sensor (FREESTYLE LIBRE 2 SENSOR) MISC FOR CONTINUOUS GLUCOSE MONITORING.   Continuous Glucose Sensor (FREESTYLE LIBRE 2 SENSOR) MISC Use daily   cyanocobalamin (VITAMIN B12) 500 MCG tablet Take 1 tablet (500 mcg total) by mouth daily. (Patient taking differently: Take 500 mcg by mouth every other day.)   dicyclomine (BENTYL) 10 MG capsule TAKE 1 CAPSULE BY MOUTH UP TO FOUR TIMES DAILY BEFORE MEALS AND AT BEDTIME (STOP IF CONSTIPATION)   donepezil (ARICEPT) 5 MG tablet TAKE 1 TABLET BY MOUTH AT BEDTIME FOR dementia   ELIQUIS 5 MG TABS tablet TAKE 1 TABLET BY MOUTH TWICE DAILY AS A BLOOD THINNER   empagliflozin (JARDIANCE) 10 MG TABS tablet Take 1 tablet (10 mg total) by mouth daily.   furosemide (LASIX) 40 MG tablet TAKE 1 TABLET BY MOUTH DAILY FOR FLUID   gabapentin (NEURONTIN) 100 MG capsule TAKE ONE CAPSULE BY MOUTH THREE TIMES DAILY FOR NEUROPATHIC PAIN   insulin glargine (LANTUS) 100 UNIT/ML injection Inject 0.15 mLs (15 Units total) into the skin daily. (Patient taking differently: Inject 20-24 Units into the  skin at bedtime.)   ipratropium-albuterol (DUONEB) 0.5-2.5  (3) MG/3ML SOLN Take 3 mLs by nebulization every 4 (four) hours as needed (Asthma).   levETIRAcetam (KEPPRA) 500 MG tablet TAKE 1 TABLET BY MOUTH TWICE DAILY FOR SEIZURE PREVENTION   levothyroxine (SYNTHROID) 200 MCG tablet Take 1 tablet (200 mcg total) by mouth daily.   loperamide (IMODIUM) 2 MG capsule Take by mouth.   LORazepam (ATIVAN) 1 MG tablet TAKE 1/2 TO 1 TABLET BY MOUTH AT BEDTIME FOR SLEEP   melatonin 3 MG TABS tablet TAKE 1 TABLET BY MOUTH AT BEDTIME AS NEED FOR SLEEP   metoprolol tartrate (LOPRESSOR) 25 MG tablet TAKE 1 TABLET BY MOUTH TWICE DAILY FOR HIGH BLOOD PRESSURE OR HEART RATE CONTROL   Multiple Vitamins-Minerals (MULTIVITAMIN WITH MINERALS) tablet Take 1 tablet by mouth daily. For Diabetics   mupirocin ointment (BACTROBAN) 2 % Apply 1 Application topically 2 (two) times daily.   nitroGLYCERIN (NITROSTAT) 0.4 MG SL tablet    NOVOLIN R FLEXPEN RELION 100 UNIT/ML FlexPen Inject 2-5 Units into the skin 3 (three) times daily before meals. Per sliding scale   pantoprazole (PROTONIX) 40 MG tablet TAKE 1 TABLET BY MOUTH TWICE DAILY FOR acid reflux   sertraline (ZOLOFT) 25 MG tablet TAKE 1 TABLET BY MOUTH DAILY FOR depression   trolamine salicylate (ASPERCREME) 10 % cream Apply 1 application. topically as needed (knee pain).   [DISCONTINUED] atorvastatin (LIPITOR) 80 MG tablet TAKE 1 TABLET BY MOUTH EVERY DAY FOR HIGH CHOLESTEROL   [DISCONTINUED] DULoxetine (CYMBALTA) 30 MG capsule TAKE 1 CAPSULE BY MOUTH TWICE DAILY FOR mood AND pain   [DISCONTINUED] traMADol (ULTRAM) 50 MG tablet Take 1 tablet (50 mg total) by mouth once as needed for up to 1 dose for moderate pain or severe pain. (Patient taking differently: Take 50 mg by mouth once as needed for moderate pain or severe pain. Taking three times daily)   potassium chloride (KLOR-CON) 10 MEQ tablet Take 1 tablet (10 mEq total) by mouth once for 1 dose.   traMADol (ULTRAM) 50 MG tablet Take 1 tablet (50 mg total) by mouth 3 (three)  times daily as needed for up to 90 doses for moderate pain or severe pain. Taking three times daily   No facility-administered encounter medications on file as of 03/29/2023.    Past Surgical History:  Procedure Laterality Date   BACK SURGERY  1996   BIOPSY N/A 11/08/2013   Procedure: BIOPSY  / Tissue sampling / ulcers present in small intestine;  Surgeon: West Bali, MD;  [facility];  Service: Endoscopy;  Laterality: N/A;   CARDIAC CATHETERIZATION  2002   CARDIAC CATHETERIZATION N/A 01/26/2015   Procedure: Right Heart Cath;  Surgeon: Laurey Morale, MD;  Location: [facility] INVASIVE CV LAB;  Service: Cardiovascular;  Laterality: N/A;   CARDIOVASCULAR STRESS TEST  2008   Stress nuclear study   CARDIOVERSION N/A 03/06/2015   Procedure: CARDIOVERSION;  Surgeon: Laurey Morale, MD;  Location: [facility] ENDOSCOPY;  Service: Cardiovascular;  Laterality: N/A;   CARPAL TUNNEL RELEASE  1994   CATARACT EXTRACTION W/PHACO Right 01/10/2022   Procedure: CATARACT EXTRACTION PHACO AND INTRAOCULAR LENS PLACEMENT (IOC);  Surgeon: [name], MD;  Location: [facility] ORS;  Service: Ophthalmology;  Laterality: Right;  CDE: 21.51   CATARACT EXTRACTION W/PHACO Left 01/24/2022   Procedure: CATARACT EXTRACTION PHACO AND INTRAOCULAR LENS PLACEMENT (IOC);  Surgeon: [name], MD;  Location: [facility] ORS;  Service: Ophthalmology;  Laterality: Left;  CDE: 17.13  COLONOSCOPY  11/2011   Dr. Darrick Penna: Internal hemorrhoids, mild diverticulosis. Random colon biopsies negative.   COLONOSCOPY N/A 11/08/2013   SLF: Normal mucosa in the terminal ileum/The colon IS redundant/  Moderate diverticulosis throughout the entire colon. ileum bx benign. colon bx benign   CRYOTHERAPY  01/23/2021   Procedure: CRYOTHERAPY;  Surgeon: Lorin Glass, MD;  Location: [facility] ENDOSCOPY;  Service: Pulmonary;;   CRYOTHERAPY  01/24/2021   Procedure: Cloyde Reams;  Surgeon: Lorin Glass, MD;  Location: [facility] ENDOSCOPY;  Service: Pulmonary;;    CRYOTHERAPY  01/26/2021   Procedure: Cloyde Reams;  Surgeon: Lupita Leash, MD;  Location: [facility] ENDOSCOPY;  Service: Cardiopulmonary;;   ESOPHAGOGASTRODUODENOSCOPY  2008   Barrett's without dysplasia. esphagus dilated. antral erosions, h.pylori serologies negative.   ESOPHAGOGASTRODUODENOSCOPY  11/2011   Dr. Darrick Penna: Barrett's esophagus, mild gastritis, diverticulum in the second portion of the duodenum repeat EGD 3 years. Small bowel biopsies negative. Gastric biopsy show reactive gastropathy but no H. pylori. Esophageal biopsies consistent with GERD. Next EGD 11/2014   ESOPHAGOGASTRODUODENOSCOPY N/A 11/21/2014   FAO:ZHYQ non-erosive gastritis/irregular z-line   ESOPHAGOGASTRODUODENOSCOPY  03/2022   FOREIGN BODY REMOVAL  01/23/2021   Procedure: FOREIGN BODY REMOVAL;  Surgeon: Lorin Glass, MD;  Location: [facility] ENDOSCOPY;  Service: Pulmonary;;   FOREIGN BODY REMOVAL  01/24/2021   Procedure: FOREIGN BODY REMOVAL;  Surgeon: Lorin Glass, MD;  Location: [facility] ENDOSCOPY;  Service: Pulmonary;;   GIVENS CAPSULE STUDY  12/07/2011   Proximal small bowel, rare AVM. Distal small bowel, multiple ulcers noted   GIVENS CAPSULE STUDY N/A 09/27/2013   Distal small bowel ulcers extending to TI.   GIVENS CAPSULE STUDY N/A 10/10/2013   Procedure: GIVENS CAPSULE STUDY;  Surgeon: West Bali, MD;  Location: [facility];  Service: Endoscopy;  Laterality: N/A;  7:30   HEMOSTASIS CONTROL  01/24/2021   Procedure: HEMOSTASIS CONTROL;  Surgeon: Lorin Glass, MD;  Location: [facility] ENDOSCOPY;  Service: Pulmonary;;   HEMOSTASIS CONTROL  01/26/2021   Procedure: HEMOSTASIS CONTROL;  Surgeon: Lupita Leash, MD;  Location: [facility] ENDOSCOPY;  Service: Cardiopulmonary;;   IR GASTROSTOMY TUBE MOD SED  01/11/2021   IR GASTROSTOMY TUBE REMOVAL  04/13/2021   IR KYPHO THORACIC WITH BONE BIOPSY  02/09/2018   KNEE ARTHROSCOPY WITH MEDIAL MENISECTOMY Right 06/09/2016   Procedure: KNEE ARTHROSCOPY WITH MEDIAL MENISECTOMY;   Surgeon: Vickki Hearing, MD;  Location: [facility]S;  Service: Orthopedics;  Laterality: Right;  medial and lateral menisectomy   LAMINECTOMY  1995   L4-L5   LAPAROSCOPIC CHOLECYSTECTOMY  1990s   LEFT HEART CATHETERIZATION WITH CORONARY ANGIOGRAM  01/10/2014   Procedure: LEFT HEART CATHETERIZATION WITH CORONARY ANGIOGRAM;  Surgeon: Lesleigh Noe, MD;  Location: [facility] CATH LAB;  Service: Cardiovascular;;   MICROLARYNGOSCOPY WITH LASER AND BALLOON DILATION N/A 11/17/2021   Procedure: MICROLARYNGOSCOPY WITH CO2 LASER, SUBGLOTTIC LARYNGEAL MASS BIOSPY;  Surgeon: Serena Colonel, MD;  Location: [facility] OR;  Service: ENT;  Laterality: N/A;   RIGHT HEART CATHETERIZATION N/A 01/10/2014   Procedure: RIGHT HEART CATH;  Surgeon: Lesleigh Noe, MD;  Location: [facility] CATH LAB;  Service: Cardiovascular;  Laterality: N/A;   TOTAL ABDOMINAL HYSTERECTOMY  1999   TRACHEOSTOMY REVISION N/A 07/18/2021   Procedure: TRACHEOSTOMY REVISION;  Surgeon: Suzanna Obey, MD;  Location: [facility] OR;  Service: ENT;  Laterality: N/A;   TRACHEOSTOMY TUBE PLACEMENT N/A 09/01/2017   Procedure: TRACHEOSTOMY;  Surgeon: Drema Halon, MD;  Location: [facility] OR;  Service: ENT;  Laterality: N/A;  VIDEO BRONCHOSCOPY Bilateral 01/23/2021   Procedure: VIDEO BRONCHOSCOPY WITHOUT FLUORO;  Surgeon: Lorin Glass, MD;  Location: [facility] ENDOSCOPY;  Service: Pulmonary;  Laterality: Bilateral;   VIDEO BRONCHOSCOPY Bilateral 01/24/2021   Procedure: VIDEO BRONCHOSCOPY WITHOUT FLUORO;  Surgeon: Lorin Glass, MD;  Location: [facility] ENDOSCOPY;  Service: Pulmonary;  Laterality: Bilateral;   VIDEO BRONCHOSCOPY N/A 01/26/2021   Procedure: VIDEO BRONCHOSCOPY WITHOUT FLUORO;  Surgeon: Lupita Leash, MD;  Location: [facility] ENDOSCOPY;  Service: Cardiopulmonary;  Laterality: N/A;    Review of Systems  Cardiovascular:  Negative for chest pain.  Neurological:  Positive for headaches. Negative for dizziness and tremors.  Endo/Heme/Allergies:  Negative for polydipsia and  polyphagia.  Psychiatric/Behavioral:  The patient is not nervous/anxious.       Objective    BP (!) 98/54   Pulse (!) 52   Ht 5\' 6"  (1.676 m)   Wt 176 lb (79.8 kg)   SpO2 97%   BMI 28.41 kg/m   Physical Exam Vitals reviewed.  Constitutional:      General: She is not in acute distress.    Appearance: Normal appearance. She is not ill-appearing, toxic-appearing or diaphoretic.  HENT:     Head: Normocephalic.     Right Ear: Tympanic membrane normal.     Left Ear: Tympanic membrane normal.     Nose: Nose normal.     Mouth/Throat:     Mouth: Mucous membranes are moist.  Eyes:     General:        Right eye: No discharge.        Left eye: No discharge.     Conjunctiva/sclera: Conjunctivae normal.     Pupils: Pupils are equal, round, and reactive to light.  Cardiovascular:     Rate and Rhythm: Normal rate.     Pulses: Normal pulses.  Pulmonary:     Breath sounds: Normal breath sounds.     Comments: Tracheostomy stoma clean and dry Abdominal:     General: Bowel sounds are normal.     Palpations: Abdomen is soft.     Tenderness: There is no abdominal tenderness. There is no left CVA tenderness or guarding.  Musculoskeletal:     Cervical back: Normal range of motion.     Comments: Patient sitting on wheelchair  Skin:    General: Skin is warm and dry.     Capillary Refill: Capillary refill takes less than 2 seconds.  Neurological:     Mental Status: She is alert.  Psychiatric:        Mood and Affect: Mood normal.        Behavior: Behavior normal.       Assessment & Plan:  Primary hypertension Assessment & Plan: Vitals:   03/29/23 1312 03/29/23 1318  BP: (!) 98/47 (!) 98/54   Blood pressure on the lower side in today's visit, Labs ordered. Advise to take half a tablet of Metoprolol 12.5 mg, follow up in 1 week with at home blood pressure reading to monitor trends. If blood pressure still low follow up asap for medication management alteration  Orders: -      BMP8+eGFR  Abnormal urine odor -     Urine Culture -     Urinalysis  Mixed hyperlipidemia -     Lipid panel  Type 2 diabetes mellitus with other circulatory complication, with long-term current use of insulin (HCC) Assessment & Plan: Last Hemoglobin A1C 9.1 not at a goal, Labs ordered today awaiting results will follow up. Patient  reporting taking Jardiance 10 mg daily, Lantus 20-24 units daily at bedtime and Novolin Flexpen 2-5 units 3 time daily before meals per sliding scale.Discussed medication desired effects, potential side effects, and how to administer the medication. Nonpharmacological interventions such as low carb diet,high in protein, vegetables and fruit discussed. Educated on importance of physical activity 150 minutes per week. Discussed signs and symptoms of hypoglycemia, & hyperglycemia and need to present to the ED if symptoms occurs.Follow up in 3 months or sooner if needed. Patient verbalizes understanding regarding plan of care and all questions answered. Ophthalmology referral placed , Foot exam within desired limits   Orders: -     Hemoglobin A1c  Hypothyroidism, unspecified type -     TSH + free T4 -     Thyroid peroxidase antibody  Skin infection -     Cephalexin; Take 1 capsule (500 mg total) by mouth 2 (two) times daily for 5 days.  Dispense: 10 capsule; Refill: 0 -     Mupirocin; Apply 1 Application topically 2 (two) times daily.  Dispense: 30 g; Refill: 1  Diabetic eye exam (HCC) -     Ambulatory referral to Ophthalmology  Moderate dementia with other behavioral disturbance, unspecified dementia type (HCC) -     Ambulatory referral to Home Health  Other orders -     Atorvastatin Calcium; Take 1 tablet (40 mg total) by mouth daily.  Dispense: 90 tablet; Refill: 3 -     traMADol HCl; Take 1 tablet (50 mg total) by mouth 3 (three) times daily as needed for up to 90 doses for moderate pain or severe pain. Taking three times daily  Dispense: 90 tablet; Refill:  0    Return in about 3 months (around 06/28/2023), or if symptoms worsen or fail to improve, for chronic follow-up.   Cruzita Lederer Newman Nip, FNP

## 2023-03-29 NOTE — Patient Instructions (Signed)
Great to see you today.  I have refilled the medication(s) we provide.    If labs were collected, we will inform you of lab results once received either by echart message or telephone call.   - echart message- for normal results that have been seen by the patient already.   - telephone call: abnormal results or if patient has not viewed results in their echart.   - Please take medications as prescribed. - Follow up with your primary health provider if any health concerns arises. - If symptoms worsen please contact your primary care provider and/or visit the emergency department.

## 2023-03-29 NOTE — Assessment & Plan Note (Addendum)
Vitals:   03/29/23 1312 03/29/23 1318  BP: (!) 98/47 (!) 98/54   Blood pressure on the lower side in today's visit, Labs ordered. Advise to take half a tablet of Metoprolol 12.5 mg, follow up in 1 week with at home blood pressure reading to monitor trends. If blood pressure still low follow up asap for medication management alteration

## 2023-03-29 NOTE — Addendum Note (Signed)
Addended by: Rica Records on: 03/29/2023 04:17 PM   Modules accepted: Orders

## 2023-03-30 ENCOUNTER — Telehealth: Payer: Self-pay | Admitting: Family Medicine

## 2023-03-30 ENCOUNTER — Other Ambulatory Visit: Payer: Self-pay | Admitting: Family Medicine

## 2023-03-30 ENCOUNTER — Inpatient Hospital Stay: Payer: PPO

## 2023-03-30 VITALS — BP 96/81 | HR 80 | Temp 97.6°F | Resp 18

## 2023-03-30 DIAGNOSIS — D509 Iron deficiency anemia, unspecified: Secondary | ICD-10-CM | POA: Diagnosis not present

## 2023-03-30 DIAGNOSIS — D508 Other iron deficiency anemias: Secondary | ICD-10-CM

## 2023-03-30 DIAGNOSIS — D500 Iron deficiency anemia secondary to blood loss (chronic): Secondary | ICD-10-CM

## 2023-03-30 MED ORDER — HEPARIN SOD (PORK) LOCK FLUSH 100 UNIT/ML IV SOLN: 250.0000 [IU] | Freq: Once | INTRAVENOUS | Status: DC | PRN

## 2023-03-30 MED ORDER — EPINEPHRINE 0.3 MG/0.3ML IJ SOAJ: 0.3000 mg | Freq: Once | INTRAMUSCULAR | Status: DC | PRN

## 2023-03-30 MED ORDER — CETIRIZINE HCL 10 MG PO TABS
10.0000 mg | ORAL_TABLET | Freq: Once | ORAL | Status: AC
  Administered 2023-03-30: 10 mg via ORAL
  Filled 2023-03-30: qty 1

## 2023-03-30 MED ORDER — ACETAMINOPHEN 325 MG PO TABS
650.0000 mg | ORAL_TABLET | Freq: Once | ORAL | Status: AC
  Administered 2023-03-30: 650 mg via ORAL
  Filled 2023-03-30: qty 2

## 2023-03-30 MED ORDER — HEPARIN SOD (PORK) LOCK FLUSH 100 UNIT/ML IV SOLN: 500.0000 [IU] | Freq: Once | INTRAVENOUS | Status: DC | PRN

## 2023-03-30 MED ORDER — METHYLPREDNISOLONE SODIUM SUCC 125 MG IJ SOLR: 125.0000 mg | Freq: Once | INTRAMUSCULAR | Status: DC | PRN

## 2023-03-30 MED ORDER — FAMOTIDINE IN NACL 20-0.9 MG/50ML-% IV SOLN: 20.0000 mg | Freq: Once | INTRAVENOUS | Status: DC | PRN

## 2023-03-30 MED ORDER — SODIUM CHLORIDE 0.9% FLUSH: 3.0000 mL | Freq: Once | INTRAVENOUS | Status: DC | PRN

## 2023-03-30 MED ORDER — SODIUM CHLORIDE 0.9% FLUSH: 10.0000 mL | Freq: Once | INTRAVENOUS | Status: DC | PRN

## 2023-03-30 MED ORDER — SODIUM CHLORIDE 0.9 % IV SOLN
1000.0000 mg | Freq: Once | INTRAVENOUS | Status: AC
  Administered 2023-03-30: 1000 mg via INTRAVENOUS
  Filled 2023-03-30: qty 1000

## 2023-03-30 MED ORDER — SODIUM CHLORIDE 0.9 % IV SOLN: Freq: Once | INTRAVENOUS | Status: DC | PRN

## 2023-03-30 MED ORDER — ALTEPLASE 2 MG IJ SOLR: 2.0000 mg | Freq: Once | INTRAMUSCULAR | Status: DC | PRN

## 2023-03-30 MED ORDER — DIPHENHYDRAMINE HCL 50 MG/ML IJ SOLN: 50.0000 mg | Freq: Once | INTRAMUSCULAR | Status: DC | PRN

## 2023-03-30 MED ORDER — SODIUM CHLORIDE 0.9 % IV SOLN: INTRAVENOUS | Status: DC

## 2023-03-30 MED ORDER — ALBUTEROL SULFATE HFA 108 (90 BASE) MCG/ACT IN AERS: 2.0000 | INHALATION_SPRAY | Freq: Once | RESPIRATORY_TRACT | Status: DC | PRN

## 2023-03-30 NOTE — Addendum Note (Signed)
Addended by: Abner Greenspan on: 03/30/2023 02:57 PM   Modules accepted: Orders

## 2023-03-30 NOTE — Progress Notes (Signed)
Per Marylene Land PA is good

## 2023-03-30 NOTE — Telephone Encounter (Signed)
Wants to know if you can resend the tramadol without the "PRN for 90 doses" on the sig. They just need it to say 3 times daily prn

## 2023-03-30 NOTE — Telephone Encounter (Signed)
[name] calling needing clarification on pt Tramadol Rx. Please advise [phone] Thank you

## 2023-03-30 NOTE — Progress Notes (Signed)
Patient tolerated iron infusion with no complaints voiced.  Peripheral IV site clean and dry with good blood return noted before and after infusion.  Band aid applied.  VSS with discharge and left in satisfactory condition with no s/s of distress noted.

## 2023-03-30 NOTE — Telephone Encounter (Signed)
Hey I did sent saying PRN 3 TIMES DAILY

## 2023-03-30 NOTE — Patient Instructions (Signed)
[facility]  Discharge Instructions: Thank you for choosing [facility] to provide your oncology and hematology care.  If you have a lab appointment with the Cancer Center - please note that after April 8th, 2024, all labs will be drawn in the cancer center.  You do not have to check in or register with the main entrance as you have in the past but will complete your check-in in the cancer center.  Wear comfortable clothing and clothing appropriate for easy access to any Portacath or PICC line.   We strive to give you quality time with your provider. You may need to reschedule your appointment if you arrive late (15 or more minutes).  Arriving late affects you and other patients whose appointments are after yours.  Also, if you miss three or more appointments without notifying the office, you may be dismissed from the clinic at the provider's discretion.      For prescription refill requests, have your pharmacy contact our office and allow 72 hours for refills to be completed.  To help prevent nausea and vomiting after your treatment, we encourage you to take your nausea medication as directed.  BELOW ARE SYMPTOMS THAT SHOULD BE REPORTED IMMEDIATELY: *FEVER GREATER THAN 100.4 F (38 C) OR HIGHER *CHILLS OR SWEATING *NAUSEA AND VOMITING THAT IS NOT CONTROLLED WITH YOUR NAUSEA MEDICATION *UNUSUAL SHORTNESS OF BREATH *UNUSUAL BRUISING OR BLEEDING *URINARY PROBLEMS (pain or burning when urinating, or frequent urination) *BOWEL PROBLEMS (unusual diarrhea, constipation, pain near the anus) TENDERNESS IN MOUTH AND THROAT WITH OR WITHOUT PRESENCE OF ULCERS (sore throat, sores in mouth, or a toothache) UNUSUAL RASH, SWELLING OR PAIN  UNUSUAL VAGINAL DISCHARGE OR ITCHING   Items with * indicate a potential emergency and should be followed up as soon as possible or go to the Emergency Department if any problems should occur.  Please show the CHEMOTHERAPY ALERT CARD or  IMMUNOTHERAPY ALERT CARD at check-in to the Emergency Department and triage nurse.  Should you have questions after your visit or need to cancel or reschedule your appointment, please contact [facility] [phone]  and follow the prompts.  Office hours are 8:00 a.m. to 4:30 p.m. Monday - Friday. Please note that voicemails left after 4:00 p.m. may not be returned until the following business day.  We are closed weekends and major holidays. You have access to a nurse at all times for urgent questions. Please call the main number to the clinic [phone] and follow the prompts.  For any non-urgent questions, you may also contact your provider using MyChart. We now offer e-Visits for anyone 18 and older to request care online for non-urgent symptoms. For details visit [email or web address].   Also download the MyChart app! Go to the app store, search "MyChart", open the app, select [facility], and log in with your MyChart username and password.   

## 2023-03-31 LAB — THYROID PEROXIDASE ANTIBODY: Thyroperoxidase Ab SerPl-aCnc: 21 [IU]/mL (ref 0–34)

## 2023-03-31 LAB — BMP8+EGFR
BUN/Creatinine Ratio: 23 (ref 12–28)
BUN: 29 mg/dL — ABNORMAL HIGH (ref 8–27)
CO2: 21 mmol/L (ref 20–29)
Calcium: 9.3 mg/dL (ref 8.7–10.3)
Chloride: 106 mmol/L (ref 96–106)
Creatinine, Ser: 1.26 mg/dL — ABNORMAL HIGH (ref 0.57–1.00)
Glucose: 120 mg/dL — ABNORMAL HIGH (ref 70–99)
Potassium: 4.1 mmol/L (ref 3.5–5.2)
Sodium: 141 mmol/L (ref 134–144)
eGFR: 45 mL/min/{1.73_m2} — ABNORMAL LOW (ref >59)

## 2023-03-31 LAB — URINALYSIS
Bilirubin, UA: NEGATIVE
Ketones, UA: NEGATIVE
Nitrite, UA: NEGATIVE
RBC, UA: NEGATIVE
Specific Gravity, UA: 1.02 (ref 1.005–1.030)
Urobilinogen, Ur: 0.2 mg/dL (ref 0.2–1.0)
pH, UA: 6 (ref 5.0–7.5)

## 2023-03-31 LAB — HEMOGLOBIN A1C
Est. average glucose Bld gHb Est-mCnc: 186 mg/dL
Hgb A1c MFr Bld: 8.1 % — ABNORMAL HIGH (ref 4.8–5.6)

## 2023-03-31 LAB — LIPID PANEL
Chol/HDL Ratio: 2.2 {ratio} (ref 0.0–4.4)
Cholesterol, Total: 77 mg/dL — ABNORMAL LOW (ref 100–199)
HDL: 35 mg/dL — ABNORMAL LOW (ref >39)
LDL Chol Calc (NIH): 25 mg/dL (ref 0–99)
Triglycerides: 80 mg/dL (ref 0–149)
VLDL Cholesterol Cal: 17 mg/dL (ref 5–40)

## 2023-03-31 LAB — TSH+FREE T4
Free T4: 2 ng/dL — ABNORMAL HIGH (ref 0.82–1.77)
TSH: 0.387 u[IU]/mL — ABNORMAL LOW (ref 0.450–4.500)

## 2023-03-31 MED ORDER — TRAMADOL HCL 50 MG PO TABS: 50.0000 mg | ORAL_TABLET | Freq: Three times a day (TID) | ORAL | 0 refills | Status: DC | PRN

## 2023-03-31 NOTE — Telephone Encounter (Signed)
Pt has transferred meds to [facility]

## 2023-04-01 LAB — URINE CULTURE

## 2023-04-02 ENCOUNTER — Other Ambulatory Visit: Payer: Self-pay | Admitting: Family Medicine

## 2023-04-02 ENCOUNTER — Encounter: Payer: Self-pay | Admitting: Family Medicine

## 2023-04-02 MED ORDER — LEVOTHYROXINE SODIUM 175 MCG PO TABS: 175.0000 ug | ORAL_TABLET | Freq: Every day | ORAL | 3 refills | Status: DC

## 2023-04-03 ENCOUNTER — Ambulatory Visit: Payer: PPO | Admitting: Cardiology

## 2023-04-03 NOTE — Telephone Encounter (Signed)
Patient requesting to  change home health from [place] to [facility] home health

## 2023-04-04 ENCOUNTER — Ambulatory Visit: Payer: PPO | Admitting: Cardiology

## 2023-04-04 NOTE — Progress Notes (Deleted)
Clinical Summary Ms. Vandenheuvel is a 72 y.o.female  seen today for follow up of the following medical problems.    Patient with chronic tracheostomy, history is provided by her daughter   1. Chronic systolic diastolic CHF with mixed precap and post postcap Pulmonary Hypertension - echo 12/2013 PASP 85, moderate TR, RV mild to moderately dilated with normal function, could not eval diasotlic function due to afib, LVEF 50-55%. Biatrial enlargement.   - RHC 01/2015 PA 38/13 and calculated mean of 21, could not get a wedge however LVEDP 15. - repeat echo 10/2014 PASP 61 mmHg (down from 85 on prior echo), some evidence of RV dysfunction with RV TAPSE 1.4 and tissue anular systolic velocity of 7.5. Cannot evaluate diastolic function, however severe biatrial enlarement suggests significant dysfunction, - 01/2015 RHC mean PA 52, PCWP 31 - 01/2015 echo LVEF 55-60%, PASP 46   - after most recent RHC in 01/2015 she was admitted for diuresis. Discharge weight 170 lbs, reportedly down from 190 on admission. Marland Kitchen Discharged on lasix 60mg  bid.         08/2020 RHC/LHC [facility]: mean PA 53, PCWP 26, PVR 3.9 WU, CI 2.9. No significant CAD 08/17/2020 echo [facility] LVEF 15-20% 08/20/2020 EF 30 [facility]% 08/29/2020 [facility] LVEF 50-55%  -01/2021 45-50%       - some recent edema, R>L - home weights up to 180s, prior baseline 170s - takes lasix 40mg  daily.      2. Persistent Afib  - amio started 02/2015.  - has had severe diffusion dfect on PFTs 04/2015 prior - more recently amio continued for rate control in setting of prior hypotension     - no recent palpitations.   - 02/2021 normal LFTs, TSH 01/2021 was normal   - mild palpitations at times usually with activity - small amounts infrequent blood on toilet paper, pcp did recent cbc and she has upcoming appt with GI           3/Chronic resp failure - has trach, O2 collar during the day and vent at night  4. Chronic venous insufficiency   5. Leg pains - 02/2023  ABIs noncompresible - followed by Dr Kirke Corin  Past Medical History:  Diagnosis Date   Abnormal pulmonary function test    Anemia    H/H of 10/30 with a normal MCV in 12/09   Anxiety    Arthritis    Barrett's esophagus    Diagnosed 1995. Last EGD 2016-NO BARRETT'S.    Chest pain    a. 2022 Cath: nl cors; b. 2008 Neg myoview; c. 01/2014 Cath: Nl cors. EF 40%; c. 08/2020 NSTEMI/Cath [facility]): nl cors.   Chronic anticoagulation    Chronic combined systolic and diastolic CHF (congestive heart failure) (HCC)    a. 10/2014 Echo: EF40%; b. 01/2015 Echo: EF 55-60%; c. 11/2020 Echo: EF 55-60%, mod LVH, sev BAE, mild MR, mod TR. PASP ; d. 01/2021 Echo: EF 45-50%, glob HK, mod conc LVH, mod reduced RV fxn, RVSP 60.80mmHg, Mod TR, mild to mod MR, sev dil LA.   Chronic LBP    Surgical intervention in 1996   Diabetes mellitus, type 2 (HCC)    Insulin therapy; exacerbated by prednisone   Gastroparesis    99% retention 05/2008 on GES   GERD (gastroesophageal reflux disease)    Heart attack (HCC) 08/13/2020   Hiatal hernia    Hyperlipidemia    Hypertension    Hypothyroid    IBS (irritable bowel syndrome)  Obesity    OSA on CPAP    had CPAP and cannot tolerate.   Paroxysmal atrial fibrillation (HCC)    a.CHA2DS2VASc = 6-->eliquis. Also on Amio.   Pulmonary hypertension (HCC) 01/2015   a. 01/2015 RHC: Predominantly pulmonary venous hypertension but may be component of PAH (PA mean 52, PCWP 31); b. 01/2021 Echo: RVSP 60.80mmHg w/ mod reduced RV fxn.   Seizures (HCC)    last seizure was 2 years ago; on keppra for this; unknown etiology   Syncope    a. Admitted 05/2009; magnetic resonance imagin/ MRA - negative; etiology thought to be orthostasis secondary to drugs and dehydration. b. Syncope 02/2015 also felt 2/2 dehydration.     Allergies  Allergen Reactions   Codeine Nausea And Vomiting and Other (See Comments)    HALLUCINATIONS/  Tolerates Tramadol      Dilaudid [Hydromorphone Hcl] Other (See  Comments)    Made her pass out/ Tolerates Tramadol     Reglan [Metoclopramide] Other (See Comments)    DYSKINESIA    Hyoscyamine Other (See Comments)    MADE DIARRHEA WORSE     Current Outpatient Medications  Medication Sig Dispense Refill   atorvastatin (LIPITOR) 40 MG tablet Take 1 tablet (40 mg total) by mouth daily. 90 tablet 3   Cholecalciferol (VITAMIN D3) 50 MCG (2000 UT) capsule Take 1 capsule (2,000 Units total) by mouth daily. 90 capsule 1   Continuous Glucose Sensor (FREESTYLE LIBRE 2 SENSOR) MISC FOR CONTINUOUS GLUCOSE MONITORING. 2 each 11   Continuous Glucose Sensor (FREESTYLE LIBRE 2 SENSOR) MISC Use daily 1 each 3   cyanocobalamin (VITAMIN B12) 500 MCG tablet Take 1 tablet (500 mcg total) by mouth daily. (Patient taking differently: Take 500 mcg by mouth every other day.) 90 tablet 3   dicyclomine (BENTYL) 10 MG capsule TAKE 1 CAPSULE BY MOUTH UP TO FOUR TIMES DAILY BEFORE MEALS AND AT BEDTIME (STOP IF CONSTIPATION) 120 capsule 3   donepezil (ARICEPT) 5 MG tablet TAKE 1 TABLET BY MOUTH AT BEDTIME FOR dementia 90 tablet 3   ELIQUIS 5 MG TABS tablet TAKE 1 TABLET BY MOUTH TWICE DAILY AS A BLOOD THINNER 180 tablet 3   empagliflozin (JARDIANCE) 10 MG TABS tablet Take 1 tablet (10 mg total) by mouth daily. 30 tablet 3   furosemide (LASIX) 40 MG tablet TAKE 1 TABLET BY MOUTH DAILY FOR FLUID 90 tablet 3   gabapentin (NEURONTIN) 100 MG capsule TAKE ONE CAPSULE BY MOUTH THREE TIMES DAILY FOR NEUROPATHIC PAIN 270 capsule 3   insulin glargine (LANTUS) 100 UNIT/ML injection Inject 0.15 mLs (15 Units total) into the skin daily. (Patient taking differently: Inject 20-24 Units into the skin at bedtime.) 10 mL 11   ipratropium-albuterol (DUONEB) 0.5-2.5 (3) MG/3ML SOLN Take 3 mLs by nebulization every 4 (four) hours as needed (Asthma).     levETIRAcetam (KEPPRA) 500 MG tablet TAKE 1 TABLET BY MOUTH TWICE DAILY FOR SEIZURE PREVENTION 180 tablet 3   levothyroxine (SYNTHROID) 175 MCG tablet  Take 1 tablet (175 mcg total) by mouth daily. 90 tablet 3   loperamide (IMODIUM) 2 MG capsule Take by mouth.     LORazepam (ATIVAN) 1 MG tablet TAKE 1/2 TO 1 TABLET BY MOUTH AT BEDTIME FOR SLEEP 30 tablet 2   melatonin 3 MG TABS tablet TAKE 1 TABLET BY MOUTH AT BEDTIME AS NEED FOR SLEEP 100 tablet 3   metoprolol tartrate (LOPRESSOR) 25 MG tablet TAKE 1 TABLET BY MOUTH TWICE DAILY FOR HIGH BLOOD PRESSURE OR  HEART RATE CONTROL 180 tablet 3   Multiple Vitamins-Minerals (MULTIVITAMIN WITH MINERALS) tablet Take 1 tablet by mouth daily. For Diabetics     mupirocin ointment (BACTROBAN) 2 % Apply 1 Application topically 2 (two) times daily. 30 g 1   nitroGLYCERIN (NITROSTAT) 0.4 MG SL tablet      NOVOLIN R FLEXPEN RELION 100 UNIT/ML FlexPen Inject 2-5 Units into the skin 3 (three) times daily before meals. Per sliding scale     pantoprazole (PROTONIX) 40 MG tablet TAKE 1 TABLET BY MOUTH TWICE DAILY FOR acid reflux 180 tablet 3   potassium chloride (KLOR-CON) 10 MEQ tablet Take 1 tablet (10 mEq total) by mouth once for 1 dose. 90 tablet 1   sertraline (ZOLOFT) 25 MG tablet TAKE 1 TABLET BY MOUTH DAILY FOR depression 90 tablet 3   traMADol (ULTRAM) 50 MG tablet Take 1 tablet (50 mg total) by mouth 3 (three) times daily as needed for moderate pain or severe pain. 90 tablet 0   trolamine salicylate (ASPERCREME) 10 % cream Apply 1 application. topically as needed (knee pain).     No current facility-administered medications for this visit.     Past Surgical History:  Procedure Laterality Date   BACK SURGERY  1996   BIOPSY N/A 11/08/2013   Procedure: BIOPSY  / Tissue sampling / ulcers present in small intestine;  Surgeon: West Bali, MD;  Location: [facility];  Service: Endoscopy;  Laterality: N/A;   CARDIAC CATHETERIZATION  2002   CARDIAC CATHETERIZATION N/A 01/26/2015   Procedure: Right Heart Cath;  Surgeon: Laurey Morale, MD;  Location: [facility] INVASIVE CV LAB;  Service: Cardiovascular;   Laterality: N/A;   CARDIOVASCULAR STRESS TEST  2008   Stress nuclear study   CARDIOVERSION N/A 03/06/2015   Procedure: CARDIOVERSION;  Surgeon: Laurey Morale, MD;  Location: [facility] ENDOSCOPY;  Service: Cardiovascular;  Laterality: N/A;   CARPAL TUNNEL RELEASE  1994   CATARACT EXTRACTION W/PHACO Right 01/10/2022   Procedure: CATARACT EXTRACTION PHACO AND INTRAOCULAR LENS PLACEMENT (IOC);  Surgeon: [name], MD;  Location: [facility] ORS;  Service: Ophthalmology;  Laterality: Right;  CDE: 21.51   CATARACT EXTRACTION W/PHACO Left 01/24/2022   Procedure: CATARACT EXTRACTION PHACO AND INTRAOCULAR LENS PLACEMENT (IOC);  Surgeon: [name], MD;  Location: [facility] ORS;  Service: Ophthalmology;  Laterality: Left;  CDE: 17.13   COLONOSCOPY  11/2011   Dr. Darrick Penna: Internal hemorrhoids, mild diverticulosis. Random colon biopsies negative.   COLONOSCOPY N/A 11/08/2013   SLF: Normal mucosa in the terminal ileum/The colon IS redundant/  Moderate diverticulosis throughout the entire colon. ileum bx benign. colon bx benign   CRYOTHERAPY  01/23/2021   Procedure: CRYOTHERAPY;  Surgeon: Lorin Glass, MD;  Location: [facility] ENDOSCOPY;  Service: Pulmonary;;   CRYOTHERAPY  01/24/2021   Procedure: Cloyde Reams;  Surgeon: Lorin Glass, MD;  Location: [facility] ENDOSCOPY;  Service: Pulmonary;;   CRYOTHERAPY  01/26/2021   Procedure: Cloyde Reams;  Surgeon: Lupita Leash, MD;  Location: [facility] ENDOSCOPY;  Service: Cardiopulmonary;;   ESOPHAGOGASTRODUODENOSCOPY  2008   Barrett's without dysplasia. esphagus dilated. antral erosions, h.pylori serologies negative.   ESOPHAGOGASTRODUODENOSCOPY  11/2011   Dr. Darrick Penna: Barrett's esophagus, mild gastritis, diverticulum in the second portion of the duodenum repeat EGD 3 years. Small bowel biopsies negative. Gastric biopsy show reactive gastropathy but no H. pylori. Esophageal biopsies consistent with GERD. Next EGD 11/2014   ESOPHAGOGASTRODUODENOSCOPY N/A 11/21/2014   ZOX:WRUE non-erosive  gastritis/irregular z-line   ESOPHAGOGASTRODUODENOSCOPY  03/2022   FOREIGN BODY  REMOVAL  01/23/2021   Procedure: FOREIGN BODY REMOVAL;  Surgeon: Lorin Glass, MD;  Location: [facility] ENDOSCOPY;  Service: Pulmonary;;   FOREIGN BODY REMOVAL  01/24/2021   Procedure: FOREIGN BODY REMOVAL;  Surgeon: Lorin Glass, MD;  Location: [facility] ENDOSCOPY;  Service: Pulmonary;;   GIVENS CAPSULE STUDY  12/07/2011   Proximal small bowel, rare AVM. Distal small bowel, multiple ulcers noted   GIVENS CAPSULE STUDY N/A 09/27/2013   Distal small bowel ulcers extending to TI.   GIVENS CAPSULE STUDY N/A 10/10/2013   Procedure: GIVENS CAPSULE STUDY;  Surgeon: West Bali, MD;  Location: [facility];  Service: Endoscopy;  Laterality: N/A;  7:30   HEMOSTASIS CONTROL  01/24/2021   Procedure: HEMOSTASIS CONTROL;  Surgeon: Lorin Glass, MD;  Location: [facility] ENDOSCOPY;  Service: Pulmonary;;   HEMOSTASIS CONTROL  01/26/2021   Procedure: HEMOSTASIS CONTROL;  Surgeon: Lupita Leash, MD;  Location: [facility] ENDOSCOPY;  Service: Cardiopulmonary;;   IR GASTROSTOMY TUBE MOD SED  01/11/2021   IR GASTROSTOMY TUBE REMOVAL  04/13/2021   IR KYPHO THORACIC WITH BONE BIOPSY  02/09/2018   KNEE ARTHROSCOPY WITH MEDIAL MENISECTOMY Right 06/09/2016   Procedure: KNEE ARTHROSCOPY WITH MEDIAL MENISECTOMY;  Surgeon: Vickki Hearing, MD;  Location: [facility] ORS;  Service: Orthopedics;  Laterality: Right;  medial and lateral menisectomy   LAMINECTOMY  1995   L4-L5   LAPAROSCOPIC CHOLECYSTECTOMY  1990s   LEFT HEART CATHETERIZATION WITH CORONARY ANGIOGRAM  01/10/2014   Procedure: LEFT HEART CATHETERIZATION WITH CORONARY ANGIOGRAM;  Surgeon: Lesleigh Noe, MD;  Location: [facility] CATH LAB;  Service: Cardiovascular;;   MICROLARYNGOSCOPY WITH LASER AND BALLOON DILATION N/A 11/17/2021   Procedure: MICROLARYNGOSCOPY WITH CO2 LASER, SUBGLOTTIC LARYNGEAL MASS BIOSPY;  Surgeon: Serena Colonel, MD;  Location: [facility] OR;  Service: ENT;  Laterality: N/A;   RIGHT  HEART CATHETERIZATION N/A 01/10/2014   Procedure: RIGHT HEART CATH;  Surgeon: Lesleigh Noe, MD;  Location: [facility] CATH LAB;  Service: Cardiovascular;  Laterality: N/A;   TOTAL ABDOMINAL HYSTERECTOMY  1999   TRACHEOSTOMY REVISION N/A 07/18/2021   Procedure: TRACHEOSTOMY REVISION;  Surgeon: Suzanna Obey, MD;  Location: [facility] OR;  Service: ENT;  Laterality: N/A;   TRACHEOSTOMY TUBE PLACEMENT N/A 09/01/2017   Procedure: TRACHEOSTOMY;  Surgeon: Drema Halon, MD;  Location: [facility] OR;  Service: ENT;  Laterality: N/A;   VIDEO BRONCHOSCOPY Bilateral 01/23/2021   Procedure: VIDEO BRONCHOSCOPY WITHOUT FLUORO;  Surgeon: Lorin Glass, MD;  Location: [facility] ENDOSCOPY;  Service: Pulmonary;  Laterality: Bilateral;   VIDEO BRONCHOSCOPY Bilateral 01/24/2021   Procedure: VIDEO BRONCHOSCOPY WITHOUT FLUORO;  Surgeon: Lorin Glass, MD;  Location: [facility] ENDOSCOPY;  Service: Pulmonary;  Laterality: Bilateral;   VIDEO BRONCHOSCOPY N/A 01/26/2021   Procedure: VIDEO BRONCHOSCOPY WITHOUT FLUORO;  Surgeon: Lupita Leash, MD;  Location: [facility] ENDOSCOPY;  Service: Cardiopulmonary;  Laterality: N/A;     Allergies  Allergen Reactions   Codeine Nausea And Vomiting and Other (See Comments)    HALLUCINATIONS/  Tolerates Tramadol      Dilaudid [Hydromorphone Hcl] Other (See Comments)    Made her pass out/ Tolerates Tramadol     Reglan [Metoclopramide] Other (See Comments)    DYSKINESIA    Hyoscyamine Other (See Comments)    MADE DIARRHEA WORSE      Family History  Problem Relation Age of Onset   Hypertension Mother    Alzheimer's disease Mother    Stroke Mother    Heart attack Mother  Breast cancer Sister    Hypertension Other    Heart disease Neg Hx    Colon cancer Neg Hx    Liver disease Neg Hx      Social History Ms. Oats reports that she quit smoking about 29 years ago. Her smoking use included cigarettes. She started smoking about 57 years ago. She has a 6.8 pack-year smoking history. She  has never used smokeless tobacco. Ms. Seeman reports that she does not currently use alcohol.   Review of Systems CONSTITUTIONAL: No weight loss, fever, chills, weakness or fatigue.  HEENT: Eyes: No visual loss, blurred vision, double vision or yellow sclerae.No hearing loss, sneezing, congestion, runny nose or sore throat.  SKIN: No rash or itching.  CARDIOVASCULAR:  RESPIRATORY: No shortness of breath, cough or sputum.  GASTROINTESTINAL: No anorexia, nausea, vomiting or diarrhea. No abdominal pain or blood.  GENITOURINARY: No burning on urination, no polyuria NEUROLOGICAL: No headache, dizziness, syncope, paralysis, ataxia, numbness or tingling in the extremities. No change in bowel or bladder control.  MUSCULOSKELETAL: No muscle, back pain, joint pain or stiffness.  LYMPHATICS: No enlarged nodes. No history of splenectomy.  PSYCHIATRIC: No history of depression or anxiety.  ENDOCRINOLOGIC: No reports of sweating, cold or heat intolerance. No polyuria or polydipsia.  Marland Kitchen   Physical Examination There were no vitals filed for this visit. There were no vitals filed for this visit.  Gen: resting comfortably, no acute distress HEENT: no scleral icterus, pupils equal round and reactive, no palptable cervical adenopathy,  CV Resp: Clear to auscultation bilaterally GI: abdomen is soft, non-tender, non-distended, normal bowel sounds, no hepatosplenomegaly MSK: extremities are warm, no edema.  Skin: warm, no rash Neuro:  no focal deficits Psych: appropriate affect   Diagnostic Studies  08/2020 cath [facility] Findings:  1. No significant coronary artery disease.  2. Elevated right and left ventricular filling pressures (LVEDP = 27 mm  Hg).  3. Severe pulmonary hypertension      08/2020 echo Summary    1. Echo contrast utilized to enhance endocardial border definition.    2. The left ventricle is normal in size with mildly increased wall  thickness.    3. The left ventricular systolic  function is severely decreased, LVEF is  visually estimated at 15-20%.    4. The left atrium is moderately dilated in size.    5. There is moderate tricuspid regurgitation.    6. There is severe pulmonary hypertension, estimated pulmonary artery  systolic pressure is 90 mmHg.    7. The right atrium is mildly to moderately dilated  in size.    8. IVC size and inspiratory change suggest mildly elevated right atrial  pressure. (5-10 mmHg).      08/2020 Summary    1. The left ventricle is upper normal in size with mildly increased wall  thickness.    2. The left ventricular systolic function is severely decreased, LVEF is  visually estimated at 30%.    3. There is severe hypokinesis of the apical left ventricular segments with  normal contractile function of the basilar segments, consistent with a stress  cardiomyopathy.    4. Mitral annular calcification is present (severe).    5. The mitral valve leaflets are mildly thickened with normal leaflet  mobility.    6. There is mild mitral valve regurgitation.    7. The aortic valve is probably trileaflet with mildly thickened leaflets  with normal excursion.    8. The left atrium is moderately dilated in  size.    9. The right ventricle is upper normal in size, with normal systolic  function.    10. The right atrium is mildly dilated  in size.    Assessment and Plan   1. Chronic diastolic heart failure -mild fluid overload today, will change lasix to 40mg  daily may take additional 20mg  prn   2. Afib/acquired thrombophilia - persistent afib. Amio had been continued previously for rate control in setting of hypotension. BP's much improved, will d/c amio and continue lopressor. Room to titrate lopressor if needed.  - cotninue eliquis for stroke prevention.      Antoine Poche, M.D.

## 2023-04-05 ENCOUNTER — Encounter: Payer: Self-pay | Admitting: "Endocrinology

## 2023-04-05 ENCOUNTER — Ambulatory Visit: Payer: PPO | Admitting: "Endocrinology

## 2023-04-05 VITALS — BP 108/66 | HR 64 | Ht 66.0 in | Wt 179.0 lb

## 2023-04-05 DIAGNOSIS — E782 Mixed hyperlipidemia: Secondary | ICD-10-CM

## 2023-04-05 DIAGNOSIS — E1122 Type 2 diabetes mellitus with diabetic chronic kidney disease: Secondary | ICD-10-CM

## 2023-04-05 DIAGNOSIS — E559 Vitamin D deficiency, unspecified: Secondary | ICD-10-CM | POA: Diagnosis not present

## 2023-04-05 DIAGNOSIS — N1831 Chronic kidney disease, stage 3a: Secondary | ICD-10-CM | POA: Diagnosis not present

## 2023-04-05 DIAGNOSIS — E039 Hypothyroidism, unspecified: Secondary | ICD-10-CM

## 2023-04-05 DIAGNOSIS — Z794 Long term (current) use of insulin: Secondary | ICD-10-CM | POA: Insufficient documentation

## 2023-04-05 DIAGNOSIS — I10 Essential (primary) hypertension: Secondary | ICD-10-CM

## 2023-04-05 MED ORDER — INSULIN GLARGINE 100 UNIT/ML ~~LOC~~ SOLN: 20.0000 [IU] | Freq: Every day | SUBCUTANEOUS | Status: DC

## 2023-04-05 NOTE — Progress Notes (Signed)
Endocrinology Consult Note       04/05/2023, 2:33 PM   Subjective:    Patient ID: [name], female    DOB: Jun 24, 1951.  [name] is being seen in consultation for management of currently uncontrolled symptomatic diabetes requested by  Rica Records, FNP.   Past Medical History:  Diagnosis Date   Abnormal pulmonary function test    Anemia    H/H of 10/30 with a normal MCV in 12/09   Anxiety    Arthritis    Barrett's esophagus    Diagnosed 1995. Last EGD 2016-NO BARRETT'S.    Chest pain    a. 2022 Cath: nl cors; b. 2008 Neg myoview; c. 01/2014 Cath: Nl cors. EF 40%; c. 08/2020 NSTEMI/[facility]): nl cors.   Chronic anticoagulation    Chronic combined systolic and diastolic CHF (congestive heart failure) (HCC)    a. 10/2014 Echo: EF40%; b. 01/2015 Echo: EF 55-60%; c. 11/2020 Echo: EF 55-60%, mod LVH, sev BAE, mild MR, mod TR. PASP ; d. 01/2021 Echo: EF 45-50%, glob HK, mod conc LVH, mod reduced RV fxn, RVSP 60.20mmHg, Mod TR, mild to mod MR, sev dil LA.   Chronic LBP    Surgical intervention in 1996   Diabetes mellitus, type 2 (HCC)    Insulin therapy; exacerbated by prednisone   Gastroparesis    99% retention 05/2008 on GES   GERD (gastroesophageal reflux disease)    Heart attack (HCC) 08/13/2020   Hiatal hernia    Hyperlipidemia    Hypertension    Hypothyroid    IBS (irritable bowel syndrome)    Obesity    OSA on CPAP    had CPAP and cannot tolerate.   Paroxysmal atrial fibrillation (HCC)    a.CHA2DS2VASc = 6-->eliquis. Also on Amio.   Pulmonary hypertension (HCC) 01/2015   a. 01/2015 RHC: Predominantly pulmonary venous hypertension but may be component of PAH (PA mean 52, PCWP 31); b. 01/2021 Echo: RVSP 60.63mmHg w/ mod reduced RV fxn.   Seizures (HCC)    last seizure was 2 years ago; on keppra for this; unknown etiology   Syncope    a. Admitted 05/2009; magnetic resonance  imagin/ MRA - negative; etiology thought to be orthostasis secondary to drugs and dehydration. b. Syncope 02/2015 also felt 2/2 dehydration.    Past Surgical History:  Procedure Laterality Date   BACK SURGERY  1996   BIOPSY N/A 11/08/2013   Procedure: BIOPSY  / Tissue sampling / ulcers present in small intestine;  Surgeon: West Bali, MD;  Location: [facility];  Service: Endoscopy;  Laterality: N/A;   CARDIAC CATHETERIZATION  2002   CARDIAC CATHETERIZATION N/A 01/26/2015   Procedure: Right Heart Cath;  Surgeon: Laurey Morale, MD;  Location: [facility] INVASIVE CV LAB;  Service: Cardiovascular;  Laterality: N/A;   CARDIOVASCULAR STRESS TEST  2008   Stress nuclear study   CARDIOVERSION N/A 03/06/2015   Procedure: CARDIOVERSION;  Surgeon: Laurey Morale, MD;  Location: [facility] ENDOSCOPY;  Service: Cardiovascular;  Laterality: N/A;   CARPAL TUNNEL RELEASE  1994   CATARACT EXTRACTION W/PHACO Right 01/10/2022  Procedure: CATARACT EXTRACTION PHACO AND INTRAOCULAR LENS PLACEMENT (IOC);  Surgeon: [name], MD;  Location: [facility] ORS;  Service: Ophthalmology;  Laterality: Right;  CDE: 21.51   CATARACT EXTRACTION W/PHACO Left 01/24/2022   Procedure: CATARACT EXTRACTION PHACO AND INTRAOCULAR LENS PLACEMENT (IOC);  Surgeon: [name], MD;  Location: [facility]S;  Service: Ophthalmology;  Laterality: Left;  CDE: 17.13   COLONOSCOPY  11/2011   Dr. Darrick Penna: Internal hemorrhoids, mild diverticulosis. Random colon biopsies negative.   COLONOSCOPY N/A 11/08/2013   SLF: Normal mucosa in the terminal ileum/The colon IS redundant/  Moderate diverticulosis throughout the entire colon. ileum bx benign. colon bx benign   CRYOTHERAPY  01/23/2021   Procedure: CRYOTHERAPY;  Surgeon: Lorin Glass, MD;  Location: [facility] ENDOSCOPY;  Service: Pulmonary;;   CRYOTHERAPY  01/24/2021   Procedure: Cloyde Reams;  Surgeon: Lorin Glass, MD;  Location: [facility] ENDOSCOPY;  Service: Pulmonary;;   CRYOTHERAPY  01/26/2021   Procedure:  Cloyde Reams;  Surgeon: Lupita Leash, MD;  Location: [facility] ENDOSCOPY;  Service: Cardiopulmonary;;   ESOPHAGOGASTRODUODENOSCOPY  2008   Barrett's without dysplasia. esphagus dilated. antral erosions, h.pylori serologies negative.   ESOPHAGOGASTRODUODENOSCOPY  11/2011   Dr. Darrick Penna: Barrett's esophagus, mild gastritis, diverticulum in the second portion of the duodenum repeat EGD 3 years. Small bowel biopsies negative. Gastric biopsy show reactive gastropathy but no H. pylori. Esophageal biopsies consistent with GERD. Next EGD 11/2014   ESOPHAGOGASTRODUODENOSCOPY N/A 11/21/2014   BJY:NWGN non-erosive gastritis/irregular z-line   ESOPHAGOGASTRODUODENOSCOPY  03/2022   FOREIGN BODY REMOVAL  01/23/2021   Procedure: FOREIGN BODY REMOVAL;  Surgeon: Lorin Glass, MD;  Location: [facility] ENDOSCOPY;  Service: Pulmonary;;   FOREIGN BODY REMOVAL  01/24/2021   Procedure: FOREIGN BODY REMOVAL;  Surgeon: Lorin Glass, MD;  Location: [facility] ENDOSCOPY;  Service: Pulmonary;;   GIVENS CAPSULE STUDY  12/07/2011   Proximal small bowel, rare AVM. Distal small bowel, multiple ulcers noted   GIVENS CAPSULE STUDY N/A 09/27/2013   Distal small bowel ulcers extending to TI.   GIVENS CAPSULE STUDY N/A 10/10/2013   Procedure: GIVENS CAPSULE STUDY;  Surgeon: West Bali, MD;  Location: [facility];  Service: Endoscopy;  Laterality: N/A;  7:30   HEMOSTASIS CONTROL  01/24/2021   Procedure: HEMOSTASIS CONTROL;  Surgeon: Lorin Glass, MD;  Location: [facility] ENDOSCOPY;  Service: Pulmonary;;   HEMOSTASIS CONTROL  01/26/2021   Procedure: HEMOSTASIS CONTROL;  Surgeon: Lupita Leash, MD;  Location: [facility] ENDOSCOPY;  Service: Cardiopulmonary;;   IR GASTROSTOMY TUBE MOD SED  01/11/2021   IR GASTROSTOMY TUBE REMOVAL  04/13/2021   IR KYPHO THORACIC WITH BONE BIOPSY  02/09/2018   KNEE ARTHROSCOPY WITH MEDIAL MENISECTOMY Right 06/09/2016   Procedure: KNEE ARTHROSCOPY WITH MEDIAL MENISECTOMY;  Surgeon: Vickki Hearing, MD;   Location: [facility];  Service: Orthopedics;  Laterality: Right;  medial and lateral menisectomy   LAMINECTOMY  1995   L4-L5   LAPAROSCOPIC CHOLECYSTECTOMY  1990s   LEFT HEART CATHETERIZATION WITH CORONARY ANGIOGRAM  01/10/2014   Procedure: LEFT HEART CATHETERIZATION WITH CORONARY ANGIOGRAM;  Surgeon: Lesleigh Noe, MD;  Location: [facility] CATH LAB;  Service: Cardiovascular;;   MICROLARYNGOSCOPY WITH LASER AND BALLOON DILATION N/A 11/17/2021   Procedure: MICROLARYNGOSCOPY WITH CO2 LASER, SUBGLOTTIC LARYNGEAL MASS BIOSPY;  Surgeon: Serena Colonel, MD;  Location: [facility] OR;  Service: ENT;  Laterality: N/A;   RIGHT HEART CATHETERIZATION N/A 01/10/2014   Procedure: RIGHT HEART CATH;  Surgeon: Lesleigh Noe, MD;  Location: [facility] CATH LAB;  Service: Cardiovascular;  Laterality: N/A;   TOTAL ABDOMINAL HYSTERECTOMY  1999   TRACHEOSTOMY REVISION N/A 07/18/2021   Procedure: TRACHEOSTOMY REVISION;  Surgeon: Suzanna Obey, MD;  Location: [facility] OR;  Service: ENT;  Laterality: N/A;   TRACHEOSTOMY TUBE PLACEMENT N/A 09/01/2017   Procedure: TRACHEOSTOMY;  Surgeon: Drema Halon, MD;  Location: [facility] OR;  Service: ENT;  Laterality: N/A;   VIDEO BRONCHOSCOPY Bilateral 01/23/2021   Procedure: VIDEO BRONCHOSCOPY WITHOUT FLUORO;  Surgeon: Lorin Glass, MD;  Location: [facility] ENDOSCOPY;  Service: Pulmonary;  Laterality: Bilateral;   VIDEO BRONCHOSCOPY Bilateral 01/24/2021   Procedure: VIDEO BRONCHOSCOPY WITHOUT FLUORO;  Surgeon: Lorin Glass, MD;  Location: [facility] ENDOSCOPY;  Service: Pulmonary;  Laterality: Bilateral;   VIDEO BRONCHOSCOPY N/A 01/26/2021   Procedure: VIDEO BRONCHOSCOPY WITHOUT FLUORO;  Surgeon: Lupita Leash, MD;  Location: [facility] ENDOSCOPY;  Service: Cardiopulmonary;  Laterality: N/A;    Social History   Socioeconomic History   Marital status: Married    Spouse name: Not on file   Number of children: Not on file   Years of education: Not on file   Highest education level: Not on file  Occupational  History   Occupation: Passenger transport manager at [employer]    Employer: [employer]    Comment: disabled    Employer: RETIRED  Tobacco Use   Smoking status: Former    Current packs/day: 0.00    Average packs/day: 0.3 packs/day for 27.3 years (6.8 ttl pk-yrs)    Types: Cigarettes    Start date: 02/26/1966    Quit date: 06/30/1993    Years since quitting: 29.7   Smokeless tobacco: Never   Tobacco comments:    quit in 1984  Vaping Use   Vaping status: Never Used  Substance and Sexual Activity   Alcohol use: Not Currently    Comment: Occasional alcohol 30 years ago (09/02/21)   Drug use: No   Sexual activity: Yes    Birth control/protection: None, Surgical  Other Topics Concern   Not on file  Social History Narrative   Sedentary   4 children, "blended family"   Social Determinants of Health   Financial Resource Strain: Not on file  Food Insecurity: No Food Insecurity (10/11/2022)   Hunger Vital Sign    Worried About Running Out of Food in the Last Year: Never true    Ran Out of Food in the Last Year: Never true  Transportation Needs: No Transportation Needs (10/11/2022)   PRAPARE - Administrator, Civil Service (Medical): No    Lack of Transportation (Non-Medical): No  Physical Activity: Not on file  Stress: Not on file  Social Connections: Not on file    Family History  Problem Relation Age of Onset   Hypertension Mother    Alzheimer's disease Mother    Stroke Mother    Heart attack Mother    Breast cancer Sister    Hypertension Other    Heart disease Neg Hx    Colon cancer Neg Hx    Liver disease Neg Hx     Outpatient Encounter Medications as of 04/05/2023  Medication Sig   atorvastatin (LIPITOR) 40 MG tablet Take 1 tablet (40 mg total) by mouth daily.   Cholecalciferol (VITAMIN D3) 50 MCG (2000 UT) capsule Take 1 capsule (2,000 Units total) by mouth daily.   Continuous Glucose Sensor (FREESTYLE LIBRE 2 SENSOR) MISC FOR CONTINUOUS GLUCOSE MONITORING.   Continuous Glucose  Sensor (FREESTYLE LIBRE 2 SENSOR) MISC Use daily   cyanocobalamin (  VITAMIN B12) 500 MCG tablet Take 1 tablet (500 mcg total) by mouth daily. (Patient taking differently: Take 500 mcg by mouth every other day.)   dicyclomine (BENTYL) 10 MG capsule TAKE 1 CAPSULE BY MOUTH UP TO FOUR TIMES DAILY BEFORE MEALS AND AT BEDTIME (STOP IF CONSTIPATION)   donepezil (ARICEPT) 5 MG tablet TAKE 1 TABLET BY MOUTH AT BEDTIME FOR dementia   ELIQUIS 5 MG TABS tablet TAKE 1 TABLET BY MOUTH TWICE DAILY AS A BLOOD THINNER   empagliflozin (JARDIANCE) 10 MG TABS tablet Take 1 tablet (10 mg total) by mouth daily.   furosemide (LASIX) 40 MG tablet TAKE 1 TABLET BY MOUTH DAILY FOR FLUID   gabapentin (NEURONTIN) 100 MG capsule TAKE ONE CAPSULE BY MOUTH THREE TIMES DAILY FOR NEUROPATHIC PAIN   insulin glargine (LANTUS) 100 UNIT/ML injection Inject 0.2 mLs (20 Units total) into the skin at bedtime.   ipratropium-albuterol (DUONEB) 0.5-2.5 (3) MG/3ML SOLN Take 3 mLs by nebulization every 4 (four) hours as needed (Asthma).   levETIRAcetam (KEPPRA) 500 MG tablet TAKE 1 TABLET BY MOUTH TWICE DAILY FOR SEIZURE PREVENTION   levothyroxine (SYNTHROID) 175 MCG tablet Take 1 tablet (175 mcg total) by mouth daily.   loperamide (IMODIUM) 2 MG capsule Take by mouth.   LORazepam (ATIVAN) 1 MG tablet TAKE 1/2 TO 1 TABLET BY MOUTH AT BEDTIME FOR SLEEP   melatonin 3 MG TABS tablet TAKE 1 TABLET BY MOUTH AT BEDTIME AS NEED FOR SLEEP   metoprolol tartrate (LOPRESSOR) 25 MG tablet TAKE 1 TABLET BY MOUTH TWICE DAILY FOR HIGH BLOOD PRESSURE OR HEART RATE CONTROL   Multiple Vitamins-Minerals (MULTIVITAMIN WITH MINERALS) tablet Take 1 tablet by mouth daily. For Diabetics   mupirocin ointment (BACTROBAN) 2 % Apply 1 Application topically 2 (two) times daily.   nitroGLYCERIN (NITROSTAT) 0.4 MG SL tablet    pantoprazole (PROTONIX) 40 MG tablet TAKE 1 TABLET BY MOUTH TWICE DAILY FOR acid reflux   potassium chloride (KLOR-CON) 10 MEQ tablet Take 1  tablet (10 mEq total) by mouth once for 1 dose.   sertraline (ZOLOFT) 25 MG tablet TAKE 1 TABLET BY MOUTH DAILY FOR depression   traMADol (ULTRAM) 50 MG tablet Take 1 tablet (50 mg total) by mouth 3 (three) times daily as needed for moderate pain or severe pain.   trolamine salicylate (ASPERCREME) 10 % cream Apply 1 application. topically as needed (knee pain).   [DISCONTINUED] insulin glargine (LANTUS) 100 UNIT/ML injection Inject 0.15 mLs (15 Units total) into the skin daily. (Patient taking differently: Inject 20-24 Units into the skin at bedtime.)   [DISCONTINUED] NOVOLIN R FLEXPEN RELION 100 UNIT/ML FlexPen Inject 2-5 Units into the skin 3 (three) times daily before meals. Per sliding scale   No facility-administered encounter medications on file as of 04/05/2023.    ALLERGIES: Allergies  Allergen Reactions   Codeine Nausea And Vomiting and Other (See Comments)    HALLUCINATIONS/  Tolerates Tramadol      Dilaudid [Hydromorphone Hcl] Other (See Comments)    Made her pass out/ Tolerates Tramadol     Reglan [Metoclopramide] Other (See Comments)    DYSKINESIA    Hyoscyamine Other (See Comments)    MADE DIARRHEA WORSE    VACCINATION STATUS: Immunization History  Administered Date(s) Administered   Influenza,inj,Quad PF,6+ Mos 04/12/2014   Influenza,inj,quad, With Preservative 04/01/2017, 03/30/2018   Influenza-Unspecified 03/05/2011, 05/22/2015   Pneumococcal Conjugate-13 03/25/2016   Pneumococcal Polysaccharide-23 04/12/2014   Pneumococcal-Unspecified 03/04/2008   Zoster Recombinant(Shingrix) 03/30/2018   Zoster, Live  05/14/2012    Diabetes She presents for her initial diabetic visit. She has type 2 diabetes mellitus. Onset time: He was diagnosed at approximate age of 33 years. There are no hypoglycemic associated symptoms. Pertinent negatives for hypoglycemia include no confusion, headaches, pallor or seizures. There are no diabetic associated symptoms. Pertinent negatives  for diabetes include no chest pain, no polydipsia, no polyphagia and no polyuria. There are no hypoglycemic complications. Symptoms are stable. Diabetic complications include heart disease and nephropathy. (Patient with history of hypoxic respiratory failure which required placement of tracheostomy with portable oxygen.  She is accompanied by her daughter to clinic.  She also has comorbid hyperlipidemia, hypertension, CKD stage III, and CHF.) Risk factors for coronary artery disease include family history, dyslipidemia, post-menopausal and sedentary lifestyle. Current diabetic treatments: She is currently on Lantus 20-24 units nightly, Jardiance 10 mg p.o. daily at bedtime.  She is also on regular insulin 2 to 5 units 3 times daily AC. Her weight is fluctuating minimally. She is following a generally unhealthy diet. When asked about meal planning, she reported none. She has not had a previous visit with a dietitian. She rarely participates in exercise. Her home blood glucose trend is fluctuating minimally. Her breakfast blood glucose range is generally 130-140 mg/dl. Her lunch blood glucose range is generally 140-180 mg/dl. Her dinner blood glucose range is generally 140-180 mg/dl. Her bedtime blood glucose range is generally 140-180 mg/dl. Her overall blood glucose range is 140-180 mg/dl. (Her CGM was reviewed and shows that she has average blood glucose of 173 for the last 14 days.  Her AGP report shows 59% time range, 29% level 1 hyperglycemia, 12% level 2 hyperglycemia.  She has no significant hypoglycemia.  Her recent A1c was 8.1%.)  Hyperlipidemia This is a chronic problem. The problem is controlled. Exacerbating diseases include chronic renal disease. Pertinent negatives include no chest pain, myalgias or shortness of breath. Current antihyperlipidemic treatment includes statins. Risk factors for coronary artery disease include dyslipidemia, diabetes mellitus, family history, post-menopausal, a sedentary  lifestyle and hypertension.  Hypertension This is a chronic problem. The problem is controlled. Pertinent negatives include no chest pain, headaches, palpitations or shortness of breath. Risk factors for coronary artery disease include family history, dyslipidemia, diabetes mellitus, sedentary lifestyle and post-menopausal state. Past treatments include beta blockers. Hypertensive end-organ damage includes kidney disease and heart failure. Identifiable causes of hypertension include chronic renal disease.   Hypothyroidism: Patient has longstanding diagnosis of hypothyroidism.  She was treated with various dose of levothyroxine over the years.  Most recently her levothyroxine was adjusted at 175 mcg p.o. daily from 200 mcg p.o. daily after blood work showed evidence of over replacement.  Review of Systems  Constitutional:  Negative for chills, fever and unexpected weight change.  HENT:  Negative for trouble swallowing and voice change.   Eyes:  Negative for visual disturbance.  Respiratory:  Negative for cough, shortness of breath and wheezing.   Cardiovascular:  Negative for chest pain, palpitations and leg swelling.  Gastrointestinal:  Negative for diarrhea, nausea and vomiting.  Endocrine: Negative for cold intolerance, heat intolerance, polydipsia, polyphagia and polyuria.  Musculoskeletal:  Negative for arthralgias and myalgias.  Skin:  Negative for color change, pallor, rash and wound.  Neurological:  Negative for seizures and headaches.  Psychiatric/Behavioral:  Negative for confusion and suicidal ideas.     Objective:       04/05/2023    1:25 PM 03/30/2023    2:59 PM 03/30/2023  1:29 PM  Vitals with BMI  Height 5\' 6"     Weight 179 lbs    BMI 28.91    Systolic 108 96 103  Diastolic 66 81 54  Pulse 64 80 84    BP 108/66   Pulse 64   Ht 5\' 6"  (1.676 m)   Wt 179 lb (81.2 kg)   BMI 28.89 kg/m   Wt Readings from Last 3 Encounters:  04/05/23 179 lb (81.2 kg)  03/29/23 176  lb (79.8 kg)  03/28/23 175 lb 14.8 oz (79.8 kg)     Physical Exam Constitutional:      Appearance: She is well-developed.  HENT:     Head: Normocephalic and atraumatic.  Neck:     Thyroid: No thyromegaly.     Trachea: No tracheal deviation.  Cardiovascular:     Rate and Rhythm: Normal rate and regular rhythm.  Pulmonary:     Effort: Pulmonary effort is normal.     Breath sounds: Rhonchi present.     Comments: using tracheostomy with portable oxygen. + Poor air entry and exchange. Abdominal:     General: Bowel sounds are normal.     Palpations: Abdomen is soft.     Tenderness: There is no abdominal tenderness. There is no guarding.  Musculoskeletal:        General: Normal range of motion.     Cervical back: Normal range of motion and neck supple.  Skin:    General: Skin is warm and dry.     Coloration: Skin is not pale.     Findings: No erythema or rash.  Neurological:     Mental Status: She is alert and oriented to person, place, and time.     Cranial Nerves: No cranial nerve deficit.     Coordination: Coordination normal.     Deep Tendon Reflexes: Reflexes are normal and symmetric.  Psychiatric:        Judgment: Judgment normal.     CMP ( most recent) CMP     Component Value Date/Time   NA 141 03/30/2023 1117   K 4.1 03/30/2023 1117   CL 106 03/30/2023 1117   CO2 21 03/30/2023 1117   GLUCOSE 120 (H) 03/30/2023 1117   GLUCOSE 177 (H) 10/11/2022 1430   BUN 29 (H) 03/30/2023 1117   CREATININE 1.26 (H) 03/30/2023 1117   CREATININE 1.40 (H) 04/08/2020 0813   CALCIUM 9.3 03/30/2023 1117   CALCIUM 8.4 06/30/2013 0318   PROT 6.9 12/28/2022 1043   ALBUMIN 4.3 12/28/2022 1043   AST 22 12/28/2022 1043   ALT 20 12/28/2022 1043   ALKPHOS 89 12/28/2022 1043   BILITOT 0.5 12/28/2022 1043   EGFR 45 (L) 03/30/2023 1117   GFRNONAA 45 (L) 10/11/2022 1430   GFRNONAA 50 (L) 06/09/2017 1223     Diabetic Labs (most recent): Lab Results  Component Value Date   HGBA1C 8.1  (H) 03/30/2023   HGBA1C 9.1 (H) 12/28/2022   HGBA1C 7.6 (H) 11/22/2020     Lipid Panel ( most recent) Lipid Panel     Component Value Date/Time   CHOL 77 (L) 03/30/2023 1117   TRIG 80 03/30/2023 1117   HDL 35 (L) 03/30/2023 1117   CHOLHDL 2.2 03/30/2023 1117   CHOLHDL 3.9 08/29/2020 0416   VLDL 23 08/29/2020 0416   LDLCALC 25 03/30/2023 1117   LABVLDL 17 03/30/2023 1117      Lab Results  Component Value Date   TSH 0.387 (L) 03/30/2023   TSH 5.400 (  H) 12/28/2022   TSH 0.697 01/04/2021   TSH 0.526 11/22/2020   TSH 0.638 08/26/2020   TSH 5.79 (H) 01/08/2020   TSH 6.431 (H) 02/16/2018   TSH 5.954 (H) 11/24/2017   TSH 2.066 08/23/2017   TSH 2.553 12/26/2015   FREET4 2.00 (H) 03/30/2023   FREET4 1.58 12/28/2022   FREET4 1.25 11/24/2017   FREET4 1.44 04/11/2014   FREET4 1.41 05/30/2009      Assessment & Plan:   1. Type 2 diabetes mellitus with stage 3a chronic kidney disease, with long-term current use of insulin (HCC)   - [name] has currently uncontrolled symptomatic type 2 DM since  72 years of age.  Her CGM was reviewed and shows that she has average blood glucose of 173 for the last 14 days.  Her AGP report shows 59% time range, 29% level 1 hyperglycemia, 12% level 2 hyperglycemia.  She has no sign -Recent labs reviewed. - I had a long discussion with her about the possible risk factors and  the pathology behind its diabetes and its complications. -her diabetes is complicated by CHF, CKD, comorbid hypertension/ hyperlipidemia, sedentary life, respiratory failure on tracheostomy with portable oxygen, and she remains at a high risk for more acute and chronic complications which include CAD, CVA, CKD, retinopathy, and neuropathy. These are all discussed in detail with her.  - I discussed all available options of managing her diabetes including de-escalation of medications.    Patient is encouraged to switch to  unprocessed or minimally processed  complex starch,  adequate protein intake (mainly plant source), minimal liquid fat, plenty of fruits, and vegetables. -  she is advised to stick to a routine mealtimes to eat 3 complete meals a day and snack only when necessary ( to snack only to correct hypoglycemia BG <70 day time or <100 at night).   - she acknowledges that there is a room for improvement in her food and drink choices. - Further Specific Suggestion is made for her to avoid simple carbohydrates  from her diet including Cakes, Sweet Desserts, Ice Cream, Soda (diet and regular), Sweet Tea, Candies, Chips, Cookies, Store Bought Juices, Alcohol ,  Artificial Sweeteners,  Coffee Creamer, and "Sugar-free" Products. This will help patient to have more stable blood glucose profile and potentially avoid unintended weight gain.   - she will be scheduled with Norm Salt, RDN, CDE for individualized diabetes education.  - I have approached her with the following individualized plan to manage  her diabetes and patient agrees:   -In light of her presentation with near target glycemic profile, she will not need  prandial insulin for now.   -I advised her to adjust her Lantus at 20 units nightly, associated with monitoring of blood glucose using her CGM continuously.  -She is advised to continue Jardiance 10 mg p.o. daily at breakfast.  Side effects and precautions discussed with her. - she is encouraged to call clinic for blood glucose levels less than 70 or above 200 mg /dl weekly average. -She is not a good candidate for higher dose of Jardiance nor metformin.  - Specific targets for  A1c;  LDL, HDL,  and Triglycerides were discussed with the patient.  2) Blood Pressure /Hypertension:  her blood pressure is  controlled to target.   she is advised to continue her current medications including metoprolol 25 mg p.o. daily, Lasix as needed.  3) Lipids/Hyperlipidemia:   Review of her recent lipid panel showed  controlled  LDL at  25 .  she  is advised to  continue    atorvastatin 40 mg daily at bedtime.  She will be considered for a lower dose of statin next visit.  Side effects and precautions discussed with her.  4)  Weight/Diet:  Body mass index is 28.89 kg/m.  -   clearly complicating her diabetes care.   she is  a candidate for weight loss. I discussed with her the fact that loss of 5 - 10% of her  current body weight will have the most impact on her diabetes management.  The above detailed  ACLM recommendations for nutrition, exercise, sleep, social life, avoidance of risky substances, the need for restorative sleep   information will also detailed on discharge instructions.   5) hypothyroidism: Her recent thyroid function tests were consistent with over replacement.  Her dose was adjusted at 175 mcg p.o. daily before breakfast.  She is advised to continue on the same dose for now.  - We discussed about the correct intake of her thyroid hormone, on empty stomach at fasting, with water, separated by at least 30 minutes from breakfast and other medications,  and separated by more than 4 hours from calcium, iron, multivitamins, acid reflux medications (PPIs). -Patient is made aware of the fact that thyroid hormone replacement is needed for life, dose to be adjusted by periodic monitoring of thyroid function tests.   6) Chronic Care/Health Maintenance:  -she  is on  Statin medications and  is encouraged to initiate and continue to follow up with Ophthalmology, Dentist,  Podiatrist at least yearly or according to recommendations, and advised to   stay away from smoking. I have recommended yearly flu vaccine and pneumonia vaccine at least every 5 years; moderate intensity exercise for up to 150 minutes weekly; and  sleep for 7- 9 hours a day.  She is encouraged to stay on her vitamin D 3 supplement 2000 units daily, however advised to hold her vitamin B12 until next measurement.  - she is  advised to maintain close follow up with Del Nigel Berthold, FNP for primary care needs, as well as her other providers for optimal and coordinated care.   Thank you for involving me in the care of this pleasant patient.  I spent  63  minutes in the care of the patient today including review of labs from CMP, Lipids, Thyroid Function, Hematology (current and previous including abstractions from other facilities); face-to-face time discussing  her blood glucose readings/logs, discussing hypoglycemia and hyperglycemia episodes and symptoms, medications doses, her options of short and long term treatment based on the latest standards of care / guidelines;  discussion about incorporating lifestyle medicine;  and documenting the encounter. Risk reduction counseling performed per USPSTF guidelines to reduce  obesity and cardiovascular risk factors.      Please refer to Patient Instructions for Blood Glucose Monitoring and Insulin/Medications Dosing Guide"  in media tab for additional information. Please  also refer to " Patient Self Inventory" in the Media  tab for reviewed elements of pertinent patient history.  [name] participated in the discussions, expressed understanding, and voiced agreement with the above plans.  All questions were answered to her satisfaction. she is encouraged to contact clinic should she have any questions or concerns prior to her return visit.   Follow up plan: - Return in about 4 months (around 08/06/2023) for F/U with Pre-visit Labs, A1c -NV.  Marquis Lunch, MD [facility]  [address] [place], [place] [address] Phone: [phone]  Fax: [phone]    04/05/2023, 2:33 PM  This note was partially dictated with voice recognition software. Similar sounding words can be transcribed inadequately or may not  be corrected upon review.

## 2023-04-05 NOTE — Patient Instructions (Signed)
                                     Advice for Weight Management  -For most of us the best way to lose weight is by diet management. Generally speaking, diet management means consuming less calories intentionally which over time brings about progressive weight loss.  This can be achieved more effectively by avoiding ultra processed carbohydrates, processed meats, unhealthy fats.    It is critically important to know your numbers: how much calorie you are consuming and how much calorie you need. More importantly, our carbohydrates sources should be unprocessed naturally occurring  complex starch food items.  It is always important to balance nutrition also by  appropriate intake of proteins (mainly plant-based), healthy fats/oils, plenty of fruits and vegetables.   -The American College of Lifestyle Medicine (ACL M) recommends nutrition derived mostly from Whole Food, Plant Predominant Sources example an apple instead of applesauce or apple pie. Eat Plenty of vegetables, Mushrooms, fruits, Legumes, Whole Grains, Nuts, seeds in lieu of processed meats, processed snacks/pastries red meat, poultry, eggs.  Use only water or unsweetened tea for hydration.  The College also recommends the need to stay away from risky substances including alcohol, smoking; obtaining 7-9 hours of restorative sleep, at least 150 minutes of moderate intensity exercise weekly, importance of healthy social connections, and being mindful of stress and seek help when it is overwhelming.    -Sticking to a routine mealtime to eat 3 meals a day and avoiding unnecessary snacks is shown to have a big role in weight control. Under normal circumstances, the only time we burn stored energy is when we are hungry, so allow  some hunger to take place- hunger means no food between appropriate meal times, only water.  It is not advisable to starve.   -It is better to avoid simple carbohydrates including:  Cakes, Sweet Desserts, Ice Cream, Soda (diet and regular), Sweet Tea, Candies, Chips, Cookies, Store Bought Juices, Alcohol in Excess of  1-2 drinks a day, Lemonade,  Artificial Sweeteners, Doughnuts, Coffee Creamers, "Sugar-free" Products, etc, etc.  This is not a complete list.....    -Consulting with certified diabetes educators is proven to provide you with the most accurate and current information on diet.  Also, you may be  interested in discussing diet options/exchanges , we can schedule a visit with Penny Crumpton, RDN, CDE for individualized nutrition education.  -Exercise: If you are able: 30 -60 minutes a day ,4 days a week, or 150 minutes of moderate intensity exercise weekly.    The longer the better if tolerated.  Combine stretch, strength, and aerobic activities.  If you were told in the past that you have high risk for cardiovascular diseases, or if you are currently symptomatic, you may seek evaluation by your heart doctor prior to initiating moderate to intense exercise programs.                                  Additional Care Considerations for Diabetes/Prediabetes   -Diabetes  is a chronic disease.  The most important care consideration is regular follow-up with your diabetes care provider with the goal being avoiding or delaying its complications and to take advantage of advances in medications and technology.  If appropriate actions are taken early enough, type 2 diabetes can even be   reversed.  Seek information from the right source.  - Whole Food, Plant Predominant Nutrition is highly recommended: Eat Plenty of vegetables, Mushrooms, fruits, Legumes, Whole Grains, Nuts, seeds in lieu of processed meats, processed snacks/pastries red meat, poultry, eggs as recommended by American College of  Lifestyle Medicine (ACLM).  -Type 2 diabetes is known to coexist with other important comorbidities such as high blood pressure and high cholesterol.  It is critical to control not only the  diabetes but also the high blood pressure and high cholesterol to minimize and delay the risk of complications including coronary artery disease, stroke, amputations, blindness, etc.  The good news is that this diet recommendation for type 2 diabetes is also very helpful for managing high cholesterol and high blood blood pressure.  - Studies showed that people with diabetes will benefit from a class of medications known as ACE inhibitors and statins.  Unless there are specific reasons not to be on these medications, the standard of care is to consider getting one from these groups of medications at an optimal doses.  These medications are generally considered safe and proven to help protect the heart and the kidneys.    - People with diabetes are encouraged to initiate and maintain regular follow-up with eye doctors, foot doctors, dentists , and if necessary heart and kidney doctors.     - It is highly recommended that people with diabetes quit smoking or stay away from smoking, and get yearly  flu vaccine and pneumonia vaccine at least every 5 years.  See above for additional recommendations on exercise, sleep, stress management , and healthy social connections.      

## 2023-04-07 ENCOUNTER — Other Ambulatory Visit: Payer: Self-pay | Admitting: Family Medicine

## 2023-04-07 ENCOUNTER — Other Ambulatory Visit: Payer: Self-pay

## 2023-04-07 DIAGNOSIS — F03B18 Unspecified dementia, moderate, with other behavioral disturbance: Secondary | ICD-10-CM

## 2023-04-07 MED ORDER — BUSPIRONE HCL 10 MG PO TABS: 10.0000 mg | ORAL_TABLET | Freq: Two times a day (BID) | ORAL | 3 refills | Status: DC

## 2023-04-07 NOTE — Telephone Encounter (Signed)
Patient daughter calling to follow up with home health referral- also wants pt to start taking Cymbalta due to "emotional flare ups." Please advise Thank you

## 2023-04-08 IMAGING — DX DG CHEST 1V PORT
1 series · 1 of 1 positions shown · non-contrast
Comparison: 10/25/2020

CLINICAL DATA: Altered mental status

EXAM:
PORTABLE CHEST 1 VIEW

[chest ap]
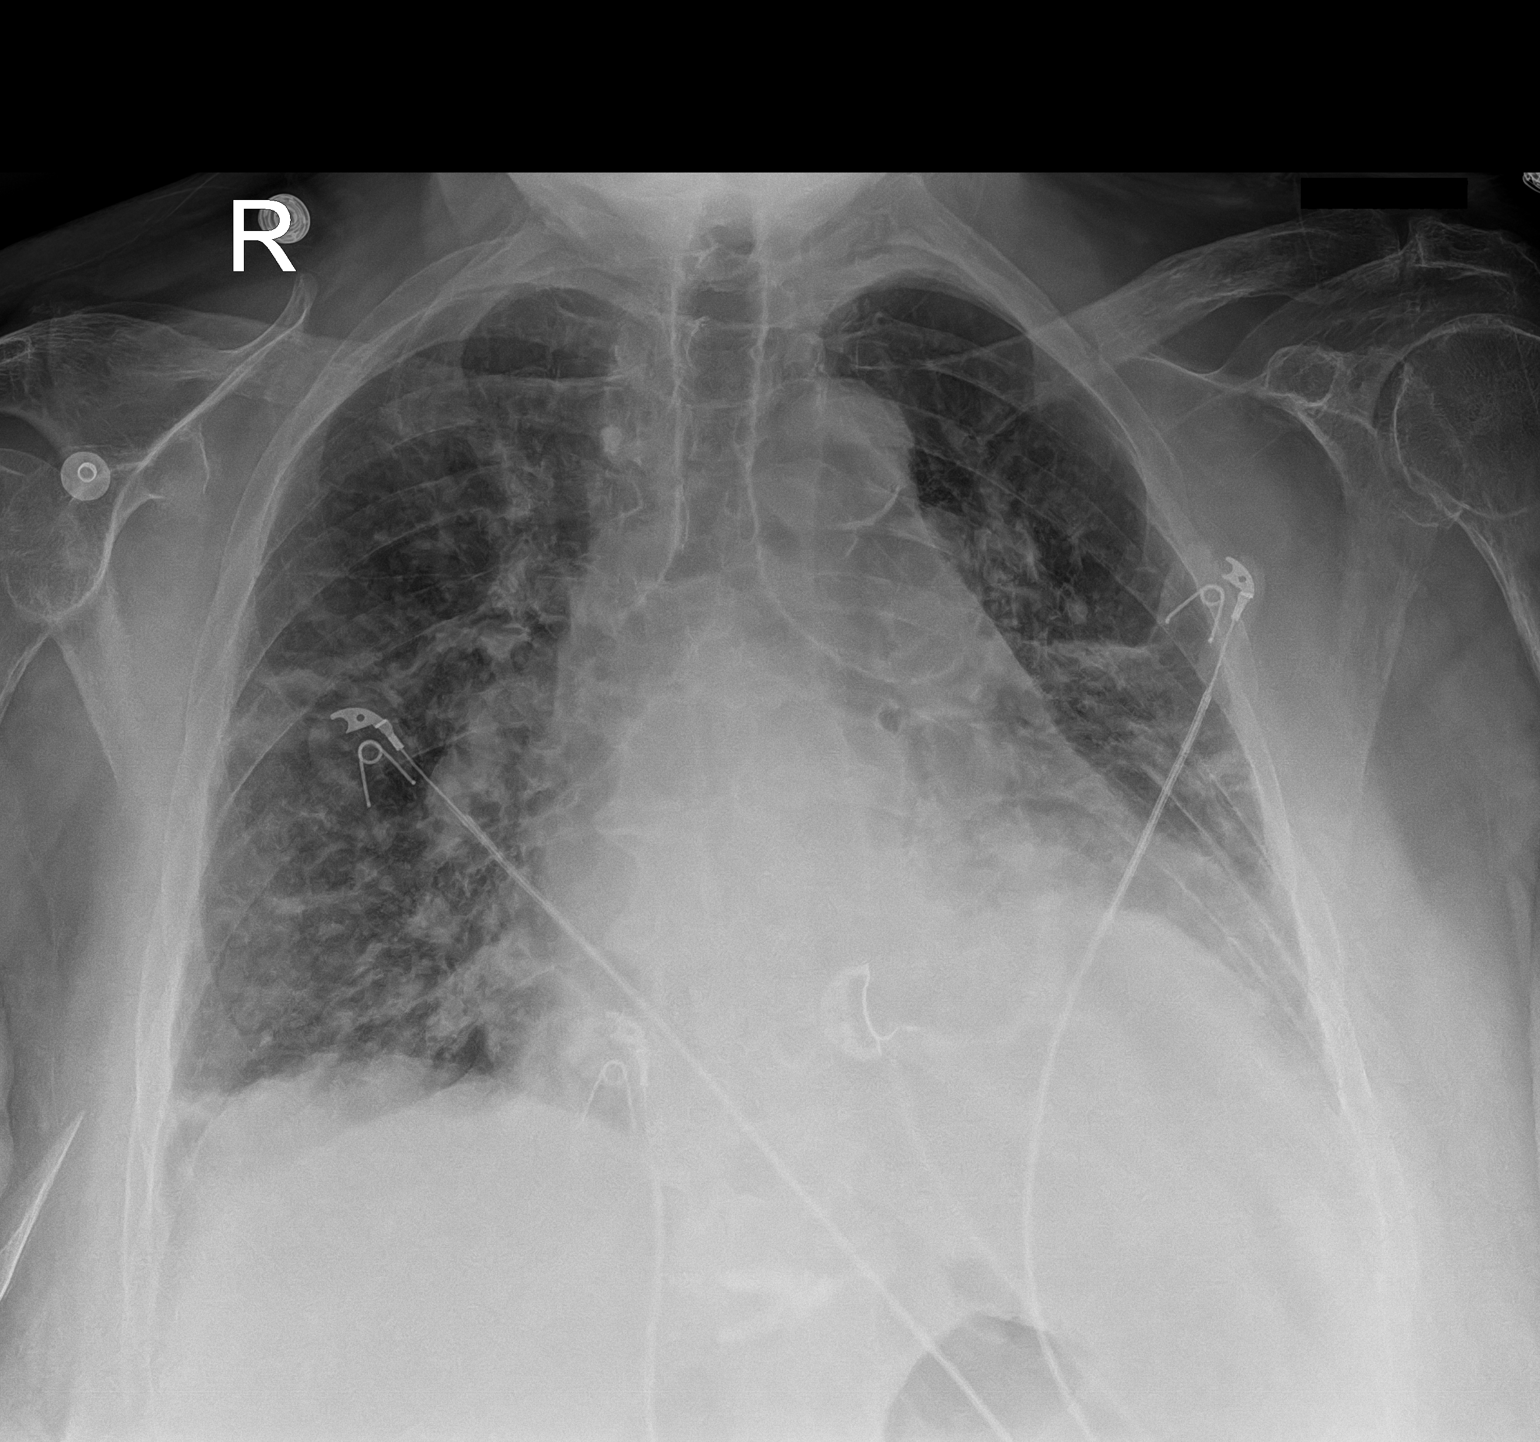

[1 of 1 positions shown; findings below may reference images not displayed]

FINDINGS: Chronic cardiomegaly. Atherosclerotic calcification of the aortic
knob. Mild pulmonary vascular congestion and bilateral interstitial
prominence. Bandlike opacities within the perihilar and bibasilar
regions. No new focal airspace consolidation. Possible trace left
pleural effusion. No pneumothorax. Bones are demineralized.
IMPRESSION: 1. Findings suggestive CHF with mild edema.
2. Bandlike opacities within the perihilar and bibasilar regions,
likely atelectasis.

## 2023-04-08 IMAGING — CT CT HEAD W/O CM
3 series · 16 of 47 positions shown, 19 images · non-contrast
Comparison: 10/25/2020 CT and prior studies

CLINICAL DATA: 69-year-old female with altered mental status.

EXAM:
CT HEAD WITHOUT CONTRAST
TECHNIQUE: Contiguous axial images were obtained from the base of the skull
through the vertex without intravenous contrast.

[Series 2: head w o · axial · 0.51mm/px · z∈[-11,+124]mm · 10 of 33 slices shown, 13 images]
[im 3/33  brain]
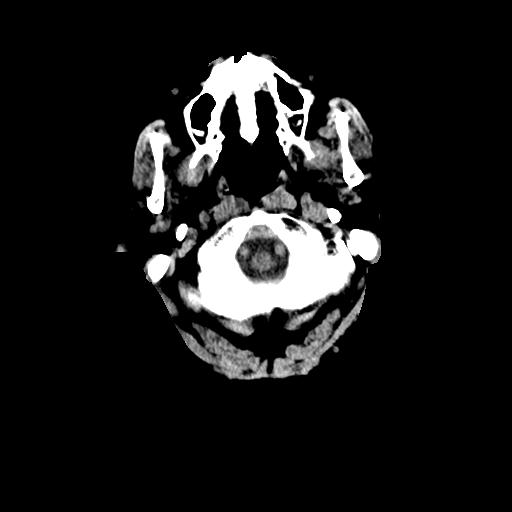
[im 3/33  bone]
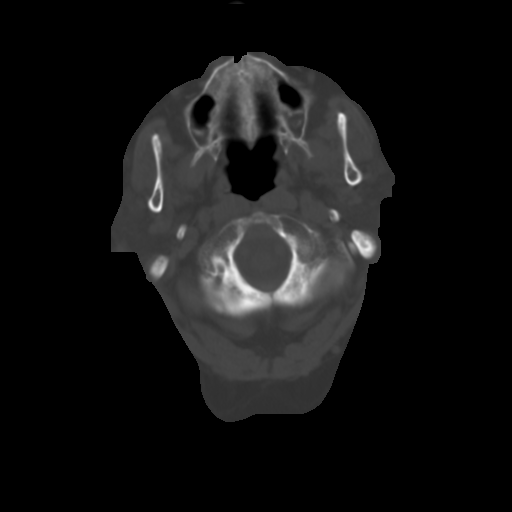
[im 6/33  brain]
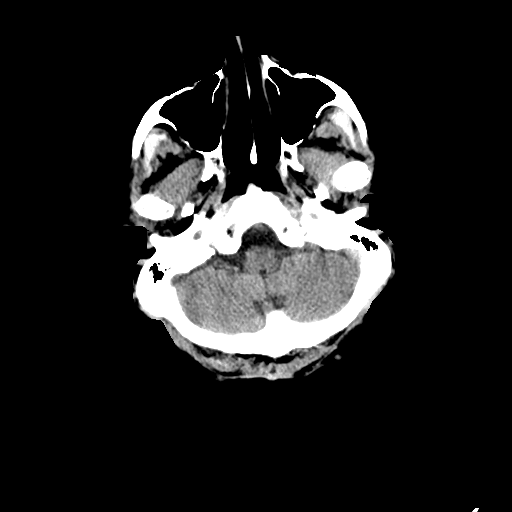
[im 9/33  brain]
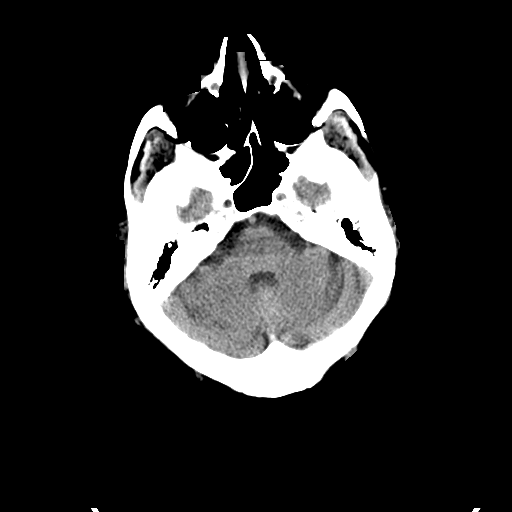
[im 12/33  brain]
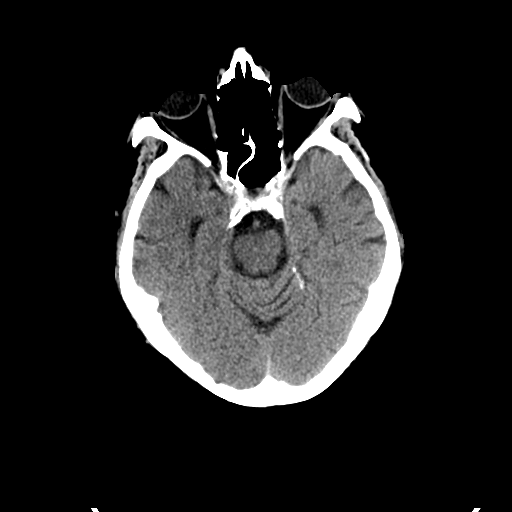
[im 15/33  brain]
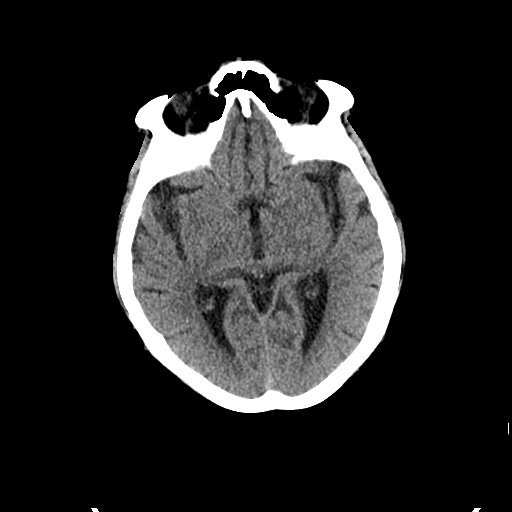
[im 15/33  bone]
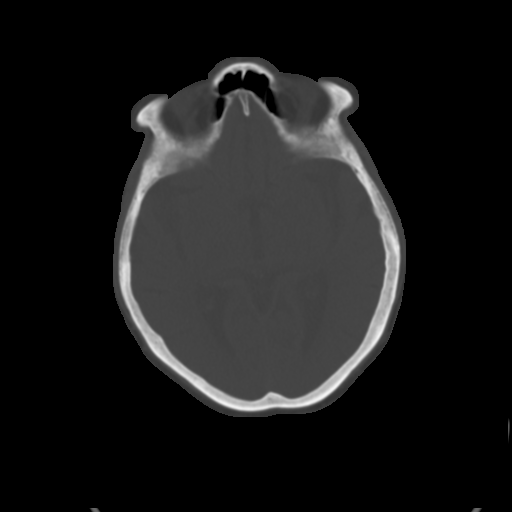
[im 18/33  brain]
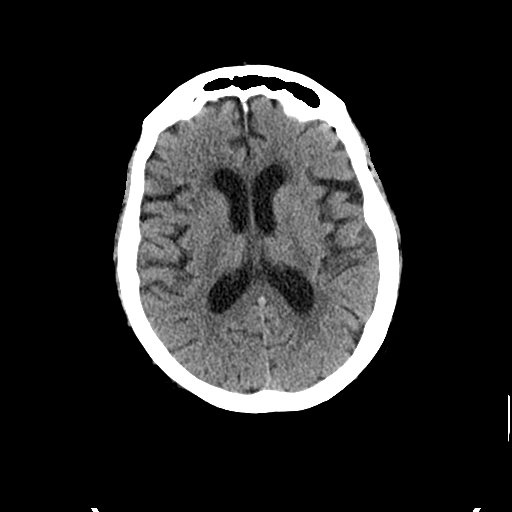
[im 21/33  brain]
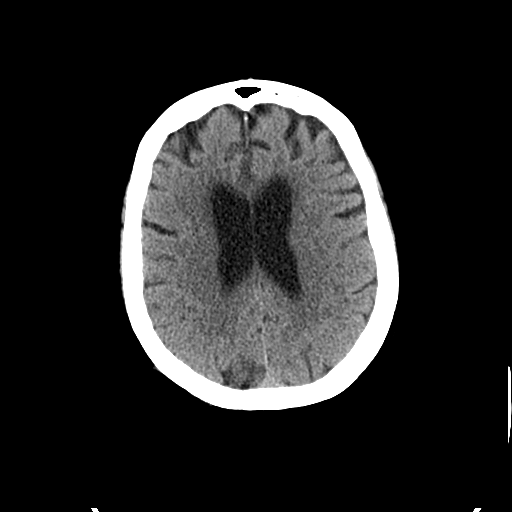
[im 25/33  brain]
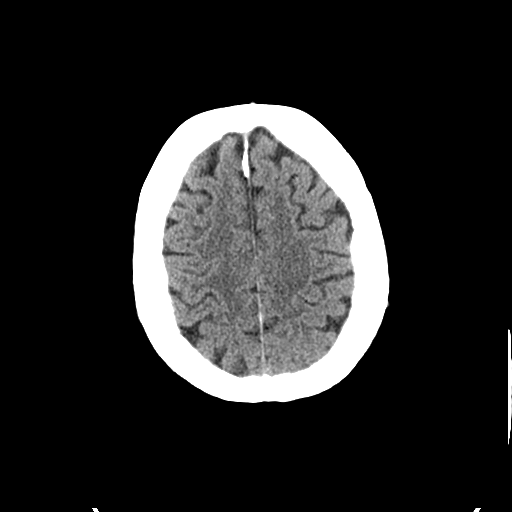
[im 27/33  brain]
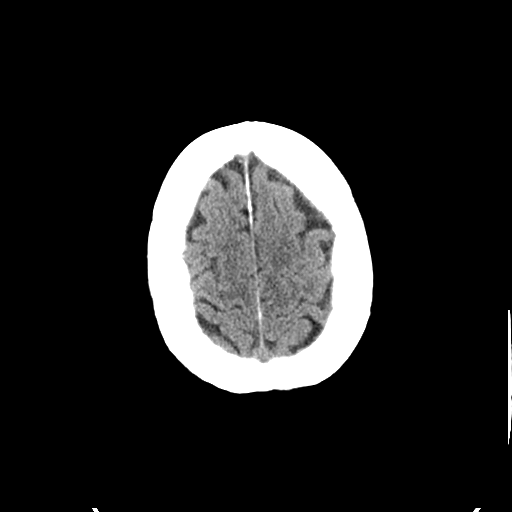
[im 27/33  bone]
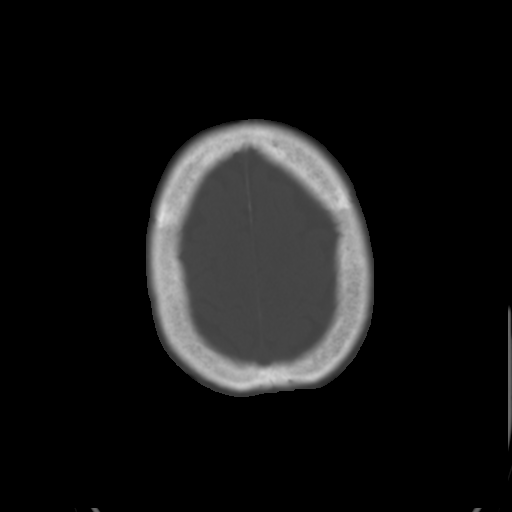
[im 30/33  brain]
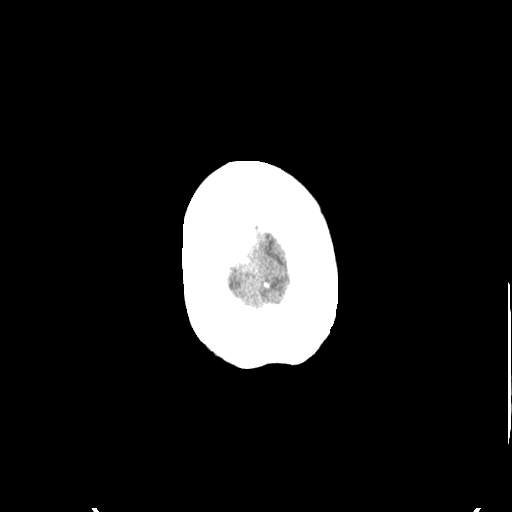

[Series 4: coronal soft · coronal · 0.34mm/px · 3 of 69 slices shown]
[im 23/69  brain]
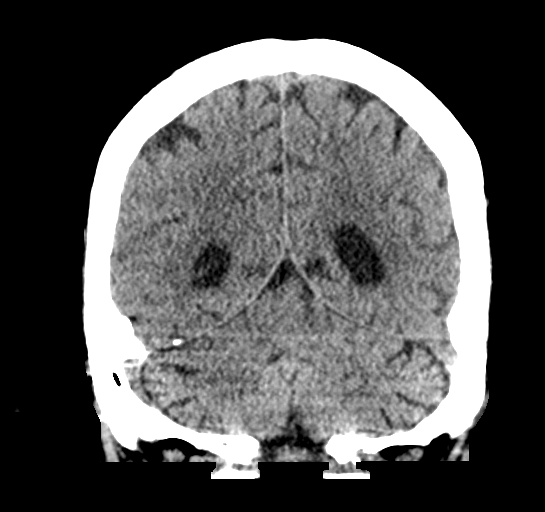
[im 31/69  brain]
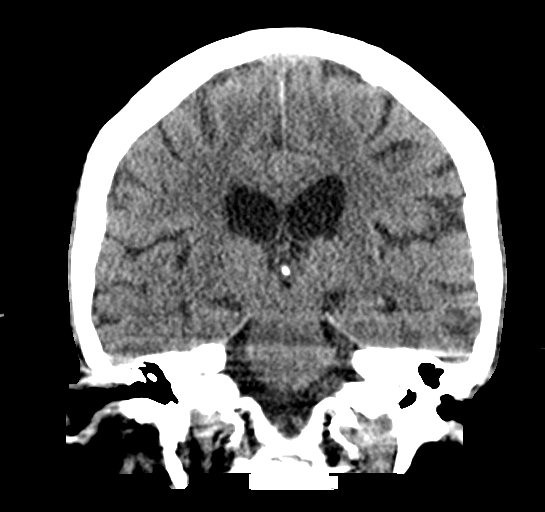
[im 38/69  brain]
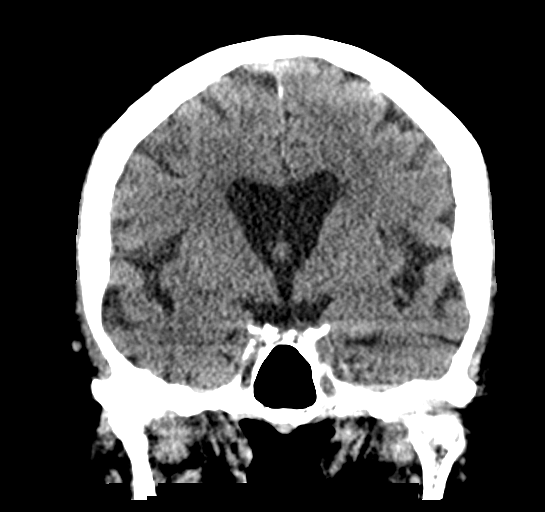

[Series 5: sagittal soft · sagittal · 0.36mm/px · 3 of 60 slices shown]
[im 20/60  brain]
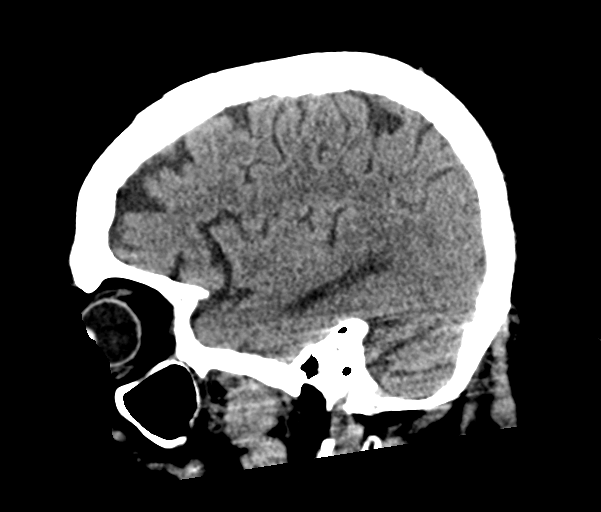
[im 30/60  brain]
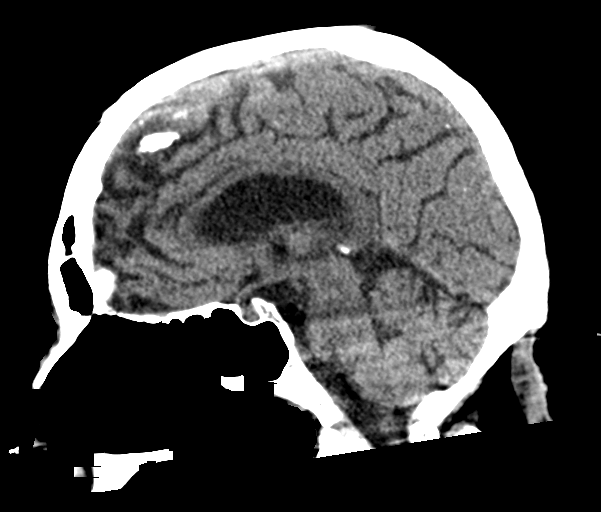
[im 40/60  brain]
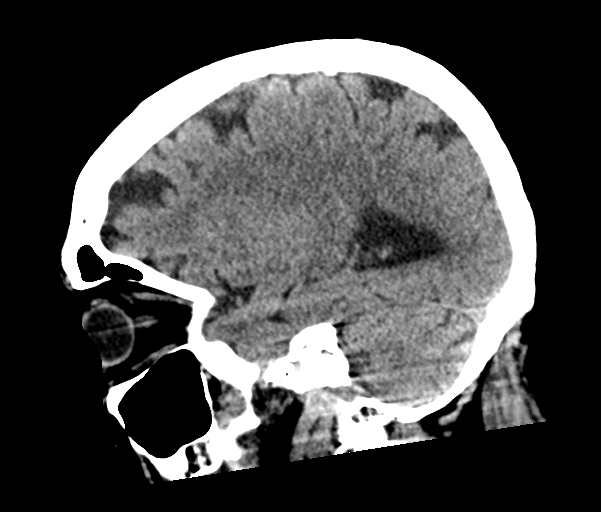

[16 of 47 positions shown; findings below may reference images not displayed]

FINDINGS: Brain: No evidence of acute infarction, hemorrhage, hydrocephalus,
extra-axial collection or mass lesion/mass effect.

Mild atrophy and chronic small-vessel white matter ischemic changes
noted.

Vascular: Carotid atherosclerotic calcifications are noted.

Skull: Normal. Negative for fracture or focal lesion.

Sinuses/Orbits: No acute finding.

Other: None.
IMPRESSION: 1. No evidence of acute intracranial abnormality.
2. Mild atrophy and chronic small-vessel white matter ischemic
changes.

## 2023-04-09 IMAGING — CT CT CHEST W/O CM
2 of 4 series · 15 of 36 positions shown, 18 images · non-contrast
Comparison: CT head of the same date.

CLINICAL DATA: Pneumonia, suspected effusion.  Dyspnea.

EXAM:
CT CHEST WITHOUT CONTRAST
TECHNIQUE: Multidetector CT imaging of the chest was performed following the
standard protocol without IV contrast.

[Series 2: routine chest without · axial · non-contrast · 0.98mm/px · z∈[+1162,+1456]mm · 12 of 173 slices shown, 15 images]
[im 13/173  mediastinal]
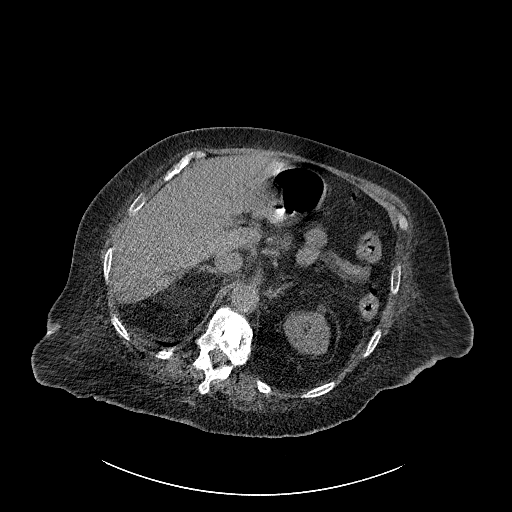
[im 13/173  lung]
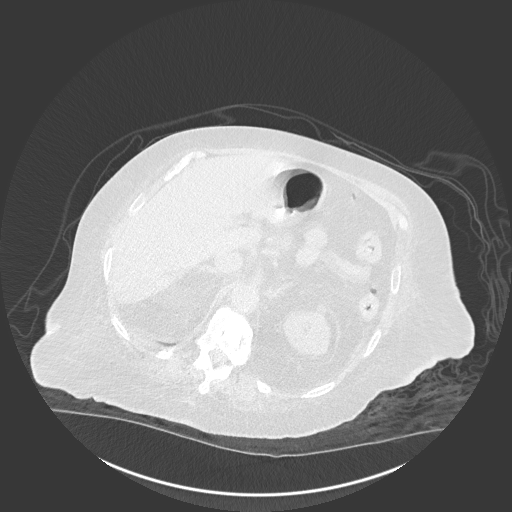
[im 25/173  lung]
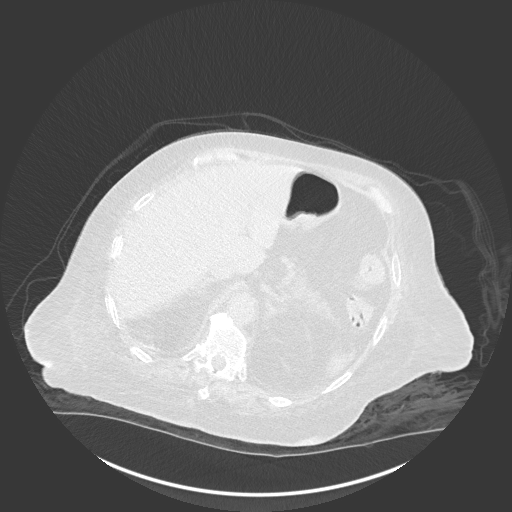
[im 37/173  lung]
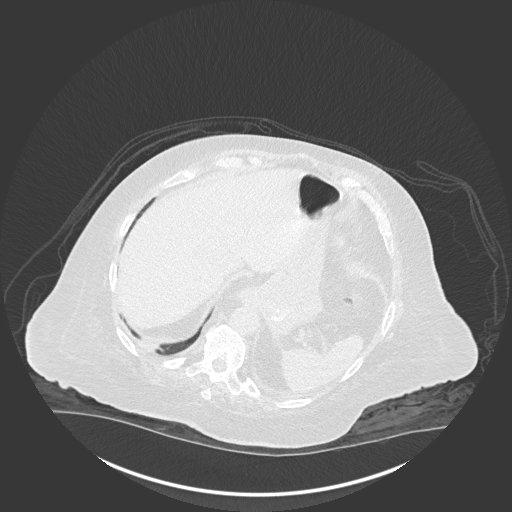
[im 50/173  lung]
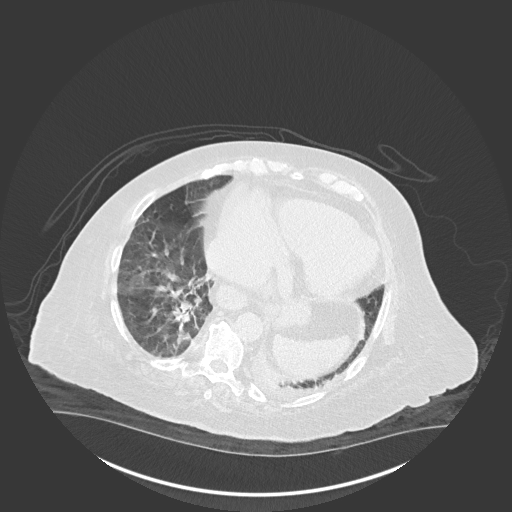
[im 62/173  mediastinal]
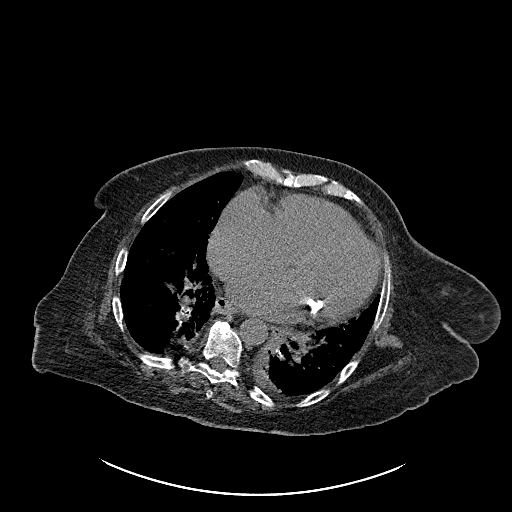
[im 62/173  lung]
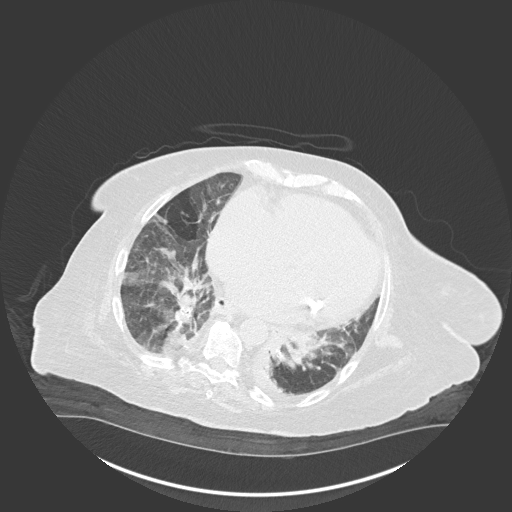
[im 74/173  lung]
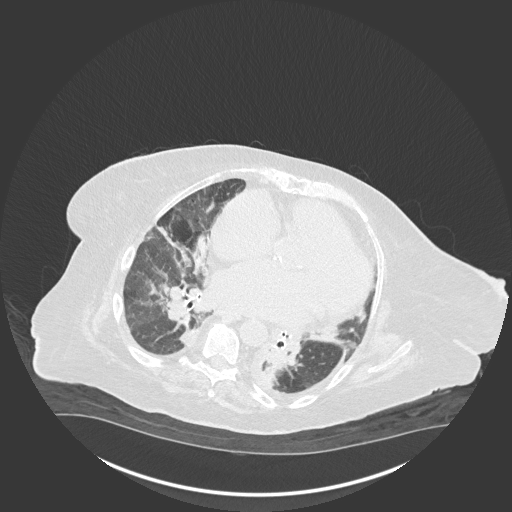
[im 99/173  lung]
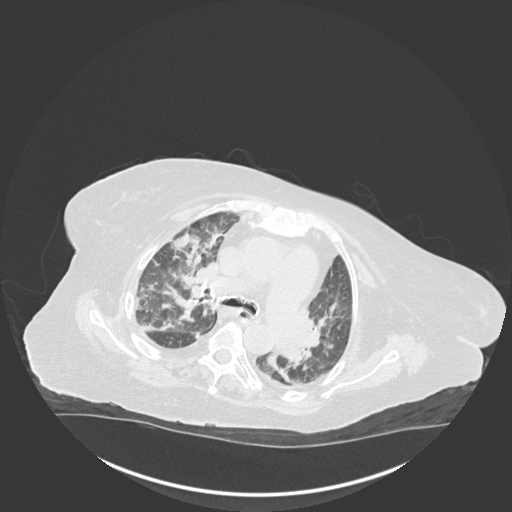
[im 111/173  lung]
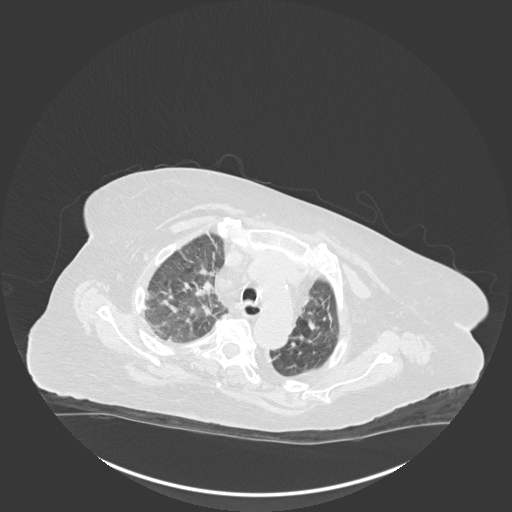
[im 123/173  mediastinal]
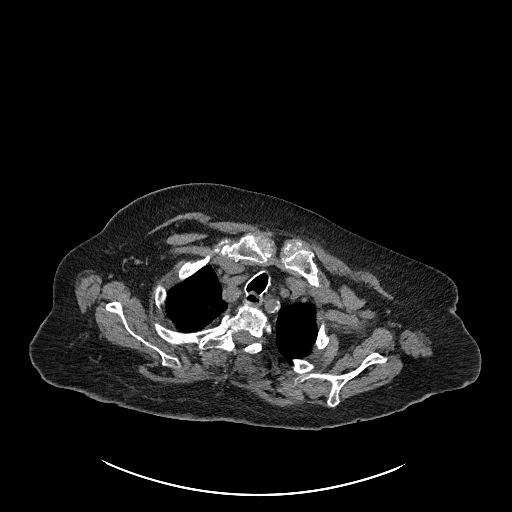
[im 123/173  lung]
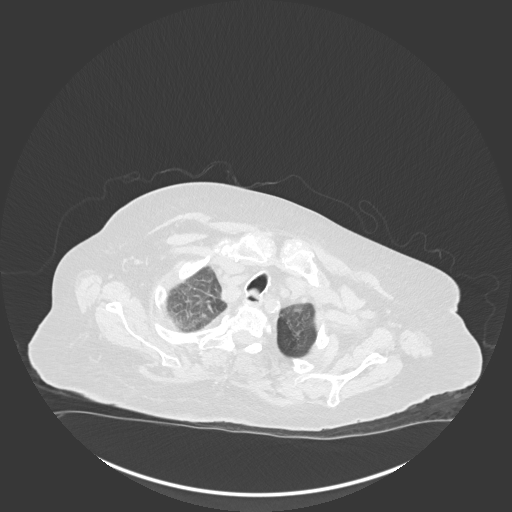
[im 136/173  lung]
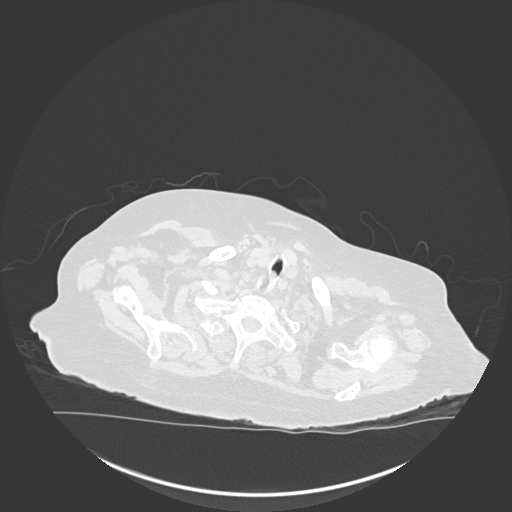
[im 148/173  lung]
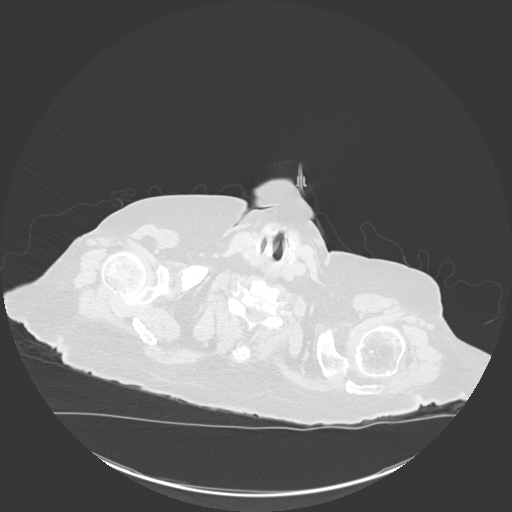
[im 160/173  lung]
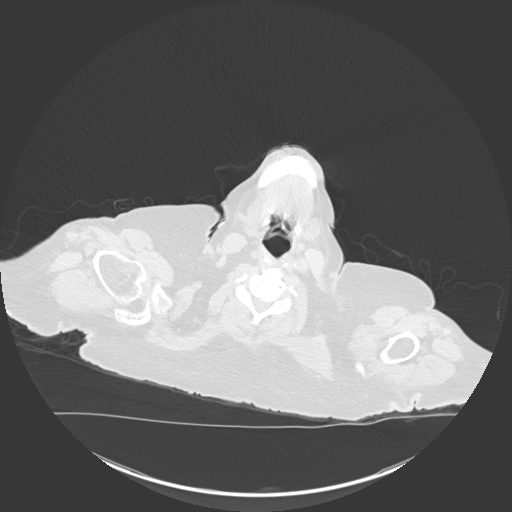

[Series 6: coronal · coronal · 0.77mm/px · 3 of 178 slices shown]
[im 36/178  lung]
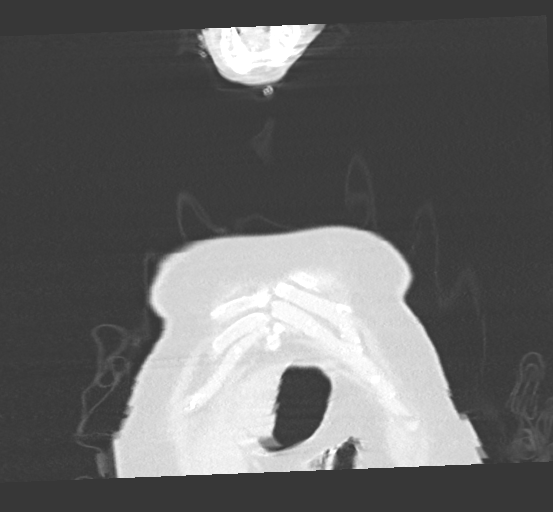
[im 71/178  lung]
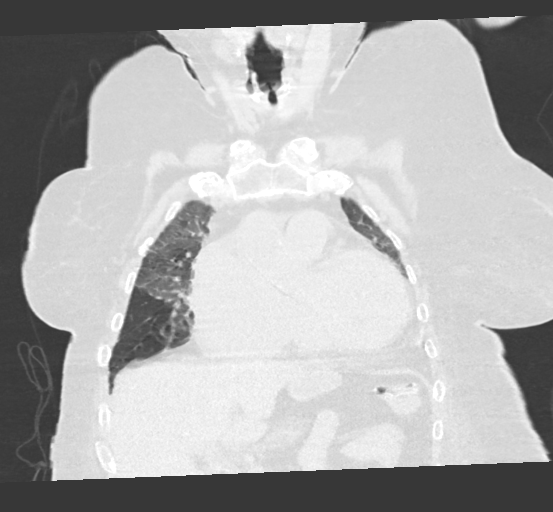
[im 107/178  lung]
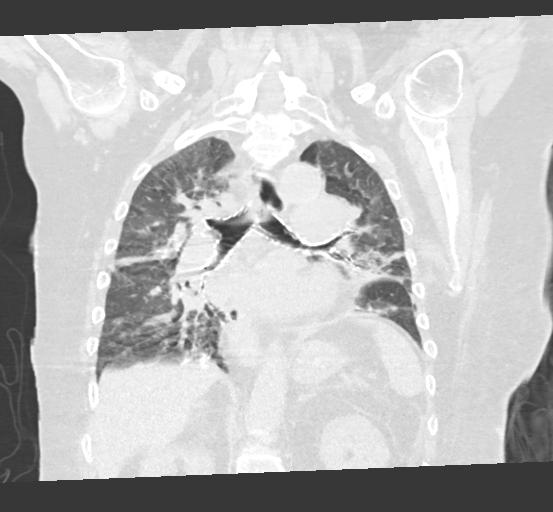

[15 of 36 positions shown; findings below may reference images not displayed]

FINDINGS: Cardiovascular: Calcified atheromatous plaque in the thoracic aorta.
Heart size remains enlarged with signs of mitral annular
calcification. No substantial pericardial effusion again with marked
cardiac enlargement. Central pulmonary vasculature with main
pulmonary artery at 4 cm greatest caliber. Grossly unchanged
compared to previous imaging.

Mediastinum/Nodes: Thoracic inlet structures are normal. No axillary
lymphadenopathy. No mediastinal lymphadenopathy with scattered
reactive size lymph nodes throughout the chest. No gross hilar
adenopathy on noncontrast imaging.

Lungs/Pleura: Limited parenchymal assessment in the setting of
respiratory motion. Slight increase in areas of volume loss and
bandlike opacity in both the RIGHT and LEFT chest. Suspect air
trapping with areas of mosaic attenuation as well. Airways with
similar appearance, no large areas of filling defect or material in
the major bronchi or in the trachea.

Trace effusions as before.

Upper Abdomen: Post cholecystectomy. No acute findings in the upper
abdomen on limited assessment. Small hiatal hernia and mildly
patulous esophagus.

Musculoskeletal: Spinal degenerative changes. Cement augmentation at
T12 as before. No acute or destructive bone finding.

Upper thoracic compression fracture as before.
IMPRESSION: 1. Slight increase in areas of patchy and bandlike opacity in the
chest, also with associated air trapping. No frank consolidative
changes. Findings could be related to asymmetric edema with
atelectasis or ongoing infection. Correlate with any history of
KHDDY-4H infection.
2. Trace effusions as before.
3. Marked cardiac enlargement with signs of mitral annular
calcification.
4. Central pulmonary artery at 4 cm, compatible with pulmonary
arterial hypertension.
5. Small hiatal hernia and mildly patulous esophagus.
6. Aortic atherosclerosis.

## 2023-04-10 IMAGING — MR MR HEAD W/O CM
7 series · 48 of 48 positions shown · non-contrast
Comparison: CT head November 21, 2020.  MRI head September 08, 2020.

CLINICAL DATA: Neuro deficit, acute stroke suspected.

EXAM:
MRI HEAD WITHOUT CONTRAST
TECHNIQUE: Multiplanar, multiecho pulse sequences of the brain and surrounding
structures were obtained without intravenous contrast.

[Series 18: T2 · oblique · 5.0mm · 0.72mm/px · 6 of 28 slices shown (1 of 2)]
[im 1/28]
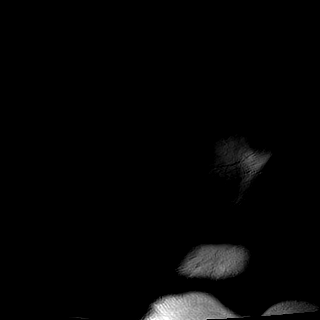
[im 6/28]
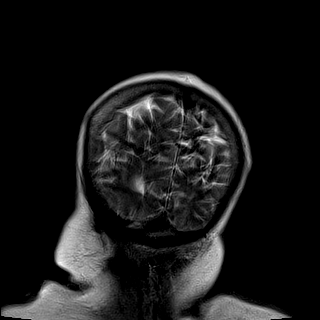
[im 11/28]
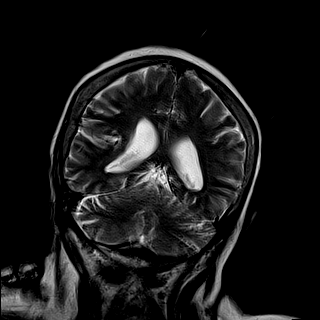
[im 17/28]
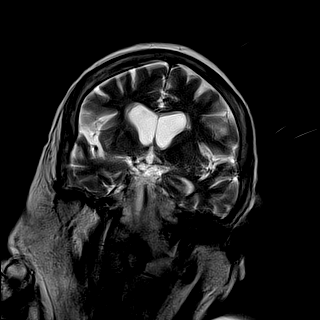
[im 22/28]
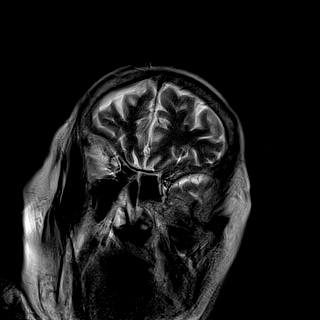
[im 28/28]
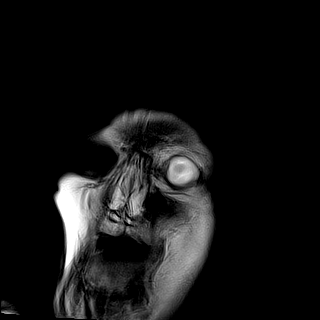

[Series 1001: ax hemo_new · axial · 5.0mm · 0.86mm/px · z∈[-53,+80]mm · 6 of 25 slices shown]
[im 1/25]
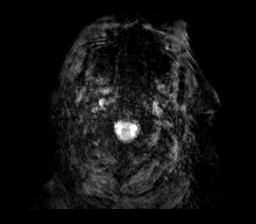
[im 5/25]
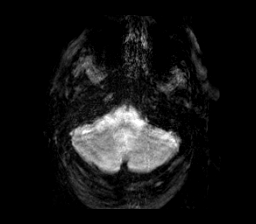
[im 10/25]
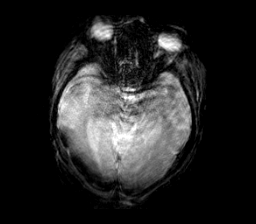
[im 15/25]
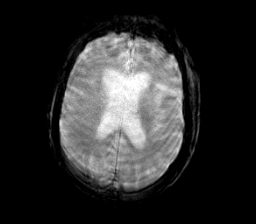
[im 20/25]
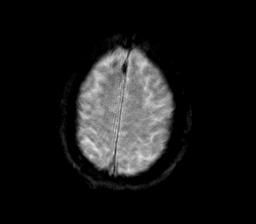
[im 25/25]
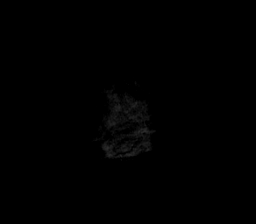

[Series 1001: ax flair_new · axial · 4.0mm · 0.43mm/px · z∈[-44,+71]mm · 7 of 32 slices shown]
[im 1/32]
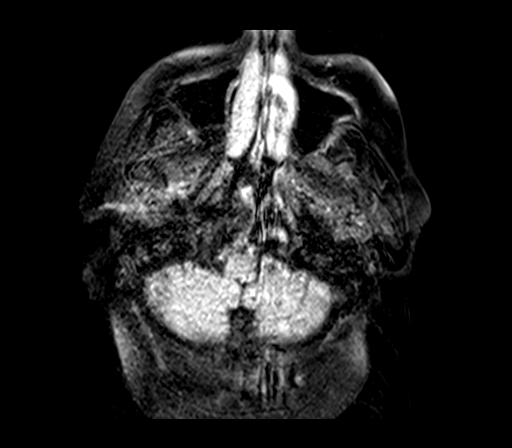
[im 6/32]
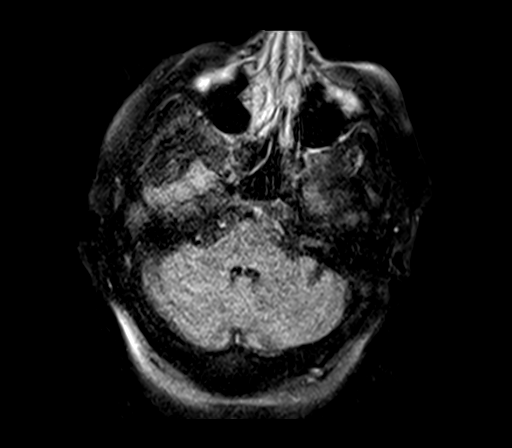
[im 11/32]
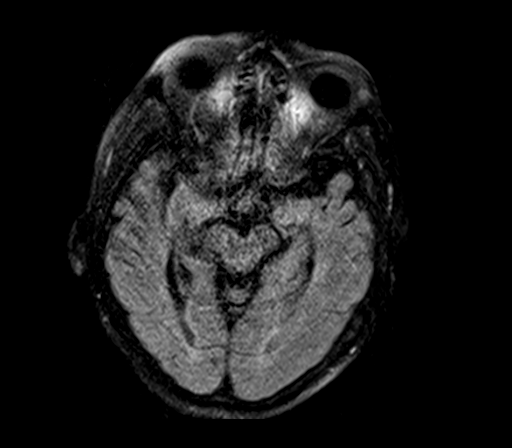
[im 16/32]
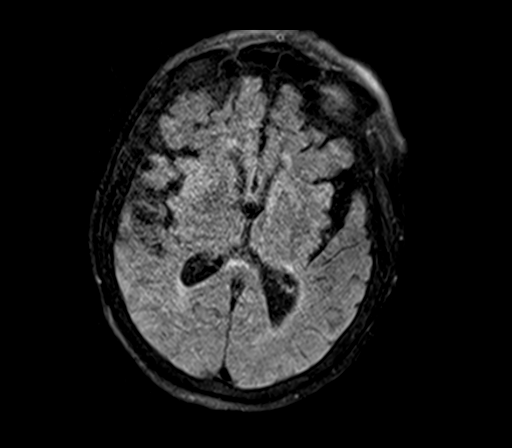
[im 21/32]
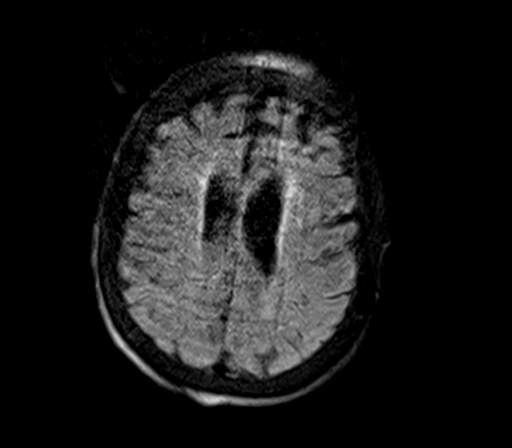
[im 26/32]
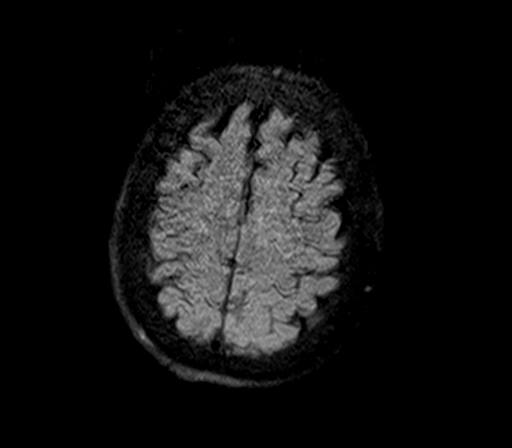
[im 32/32]
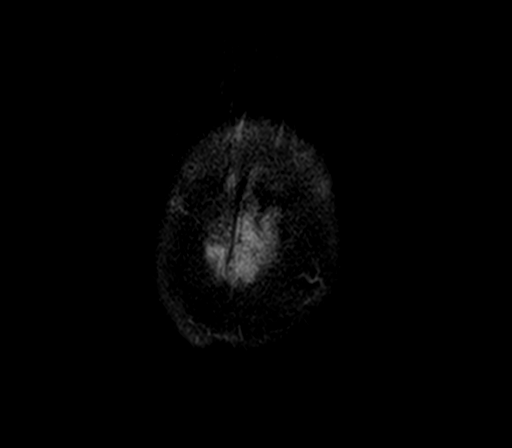

[Series 1017: DWI · axial · 4.0mm · 0.88mm/px · z∈[-19,+103]mm · 8 of 36 slices shown (1 of 3)]
[im 1/36]
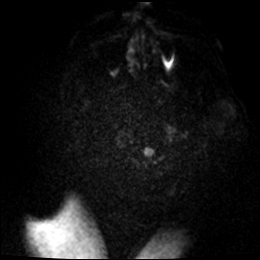
[im 6/36]
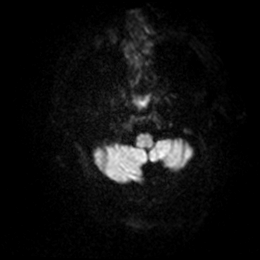
[im 11/36]
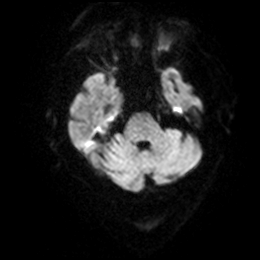
[im 16/36]
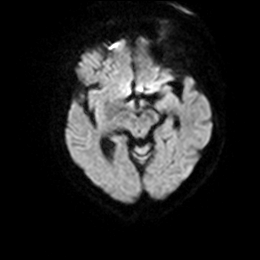
[im 21/36]
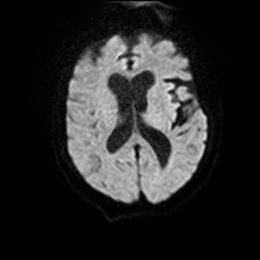
[im 26/36]
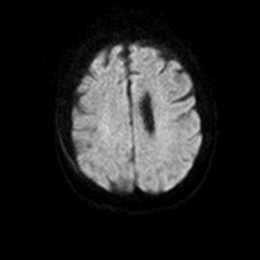
[im 31/36]
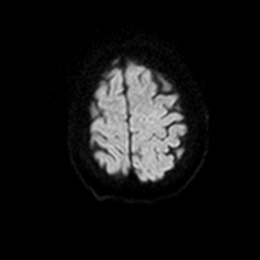
[im 36/36]
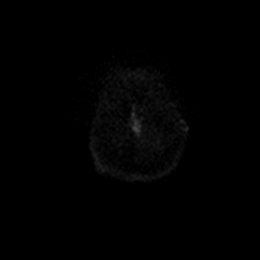

[Series 1017: DWI · axial · 4.0mm · 0.88mm/px · z∈[-19,+103]mm · 8 of 36 slices shown (2 of 3)]
[im 1/36]
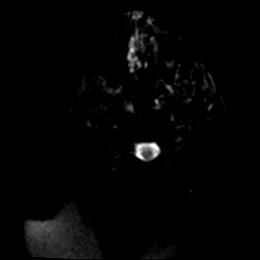
[im 6/36]
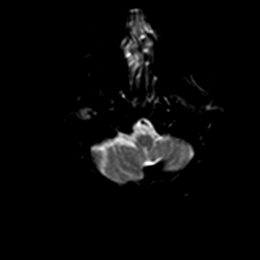
[im 11/36]
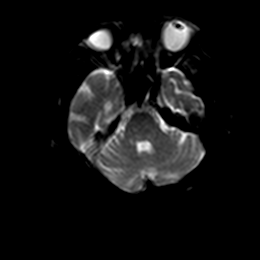
[im 16/36]
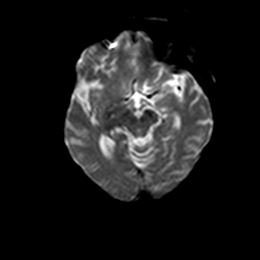
[im 21/36]
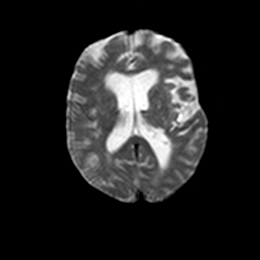
[im 26/36]
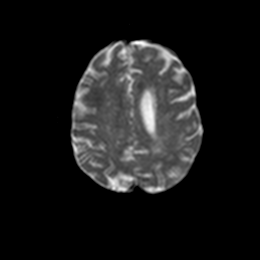
[im 31/36]
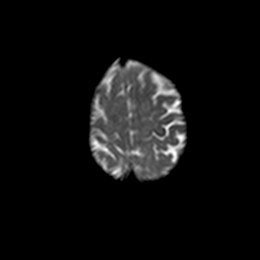
[im 36/36]
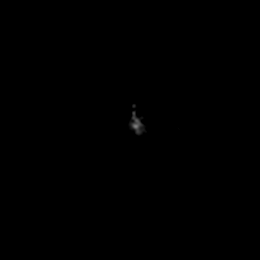

[Series 1024: DWI · axial · 4.0mm · 0.88mm/px · z∈[-19,+103]mm · 8 of 36 slices shown (3 of 3)]
[im 1/36]
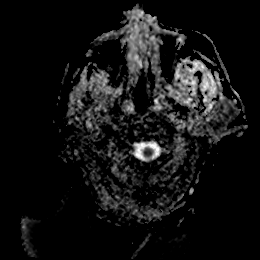
[im 6/36]
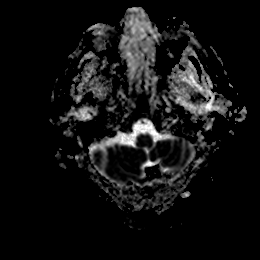
[im 11/36]
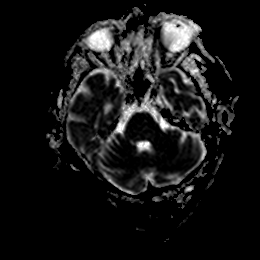
[im 16/36]
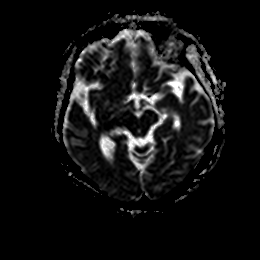
[im 21/36]
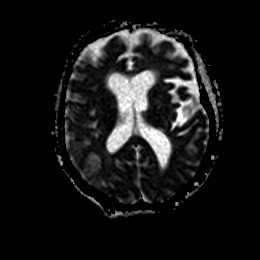
[im 26/36]
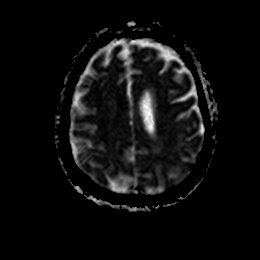
[im 31/36]
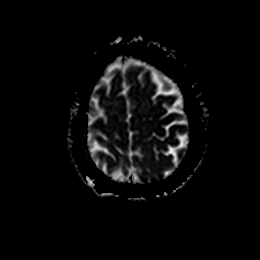
[im 36/36]
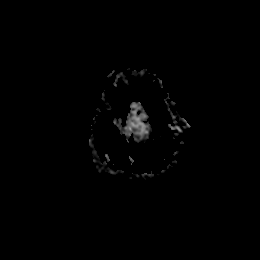

[Series 1031: T2 · axial · 5.0mm · 0.72mm/px · z∈[-59,+70]mm · 5 of 20 slices shown (2 of 2)]
[im 1/20]
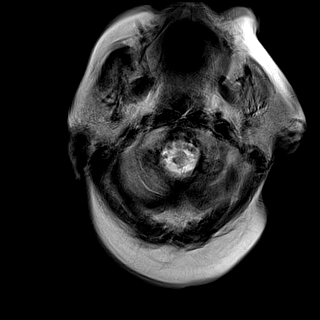
[im 5/20]
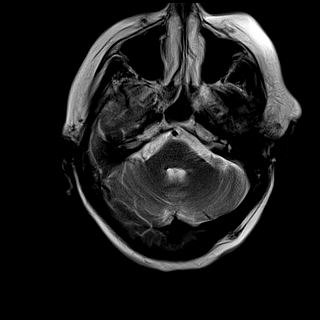
[im 10/20]
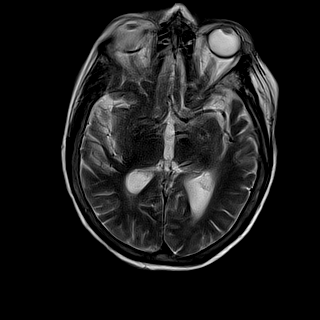
[im 15/20]
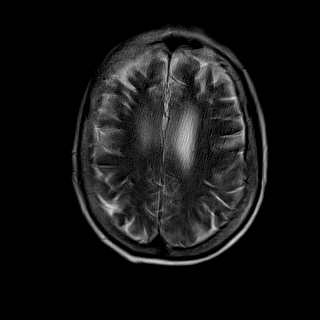
[im 20/20]
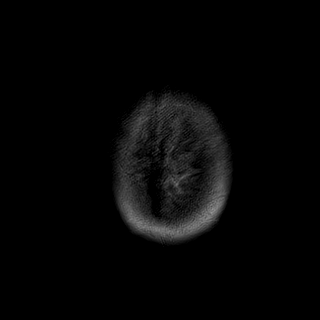

[48 of 48 positions shown; findings below may reference images not displayed]

FINDINGS: Significantly motion limited and incomplete examination due to
patient intolerance. Within this limitation:

Brain: No evidence of acute infarct, hydrocephalus, acute
hemorrhage, mass lesion, midline shift, or large extra-axial fluid
collection. Similar mild atrophy with ex vacuo ventricular dilation.
Mild patchy T2/FLAIR hyperintensity within the white matter is
suboptimally evaluated due to motion but most likely related to
chronic microvascular ischemic disease.

Vascular: Major arterial flow voids are maintained at the skull
base.

Skull and upper cervical spine: Limited evaluation with out evidence
of abnormal marrow signal.

Sinuses/Orbits: Mild paranasal sinus mucosal thickening without
air-fluid levels. Unremarkable orbits.

Other: No sizable mastoid effusions.
IMPRESSION: No evidence of acute infarction, hemorrhage or mass on this
significantly motion limited and incomplete exam.

## 2023-04-14 NOTE — Telephone Encounter (Signed)
Spoke with patient's daughter. Daughter is in contact with [facility].

## 2023-04-24 ENCOUNTER — Ambulatory Visit: Payer: PPO | Admitting: Neurology

## 2023-04-24 DIAGNOSIS — R269 Unspecified abnormalities of gait and mobility: Secondary | ICD-10-CM

## 2023-04-24 DIAGNOSIS — G40909 Epilepsy, unspecified, not intractable, without status epilepticus: Secondary | ICD-10-CM

## 2023-04-24 DIAGNOSIS — R4182 Altered mental status, unspecified: Secondary | ICD-10-CM

## 2023-04-24 DIAGNOSIS — F03B0 Unspecified dementia, moderate, without behavioral disturbance, psychotic disturbance, mood disturbance, and anxiety: Secondary | ICD-10-CM

## 2023-04-26 ENCOUNTER — Ambulatory Visit (HOSPITAL_COMMUNITY): Payer: PPO

## 2023-04-27 ENCOUNTER — Inpatient Hospital Stay (HOSPITAL_COMMUNITY): Admission: RE | Admit: 2023-04-27 | Payer: PPO | Source: Ambulatory Visit

## 2023-04-27 ENCOUNTER — Ambulatory Visit
Admission: RE | Admit: 2023-04-27 | Discharge: 2023-04-27 | Disposition: A | Discharge status: Home / Self Care | Payer: PPO | Source: Ambulatory Visit | Attending: Neurology | Admitting: Neurology

## 2023-04-27 DIAGNOSIS — R269 Unspecified abnormalities of gait and mobility: Secondary | ICD-10-CM

## 2023-04-27 DIAGNOSIS — F03B0 Unspecified dementia, moderate, without behavioral disturbance, psychotic disturbance, mood disturbance, and anxiety: Secondary | ICD-10-CM | POA: Diagnosis not present

## 2023-04-28 ENCOUNTER — Telehealth: Payer: Self-pay | Admitting: Neurology

## 2023-04-28 NOTE — Telephone Encounter (Signed)
Please call patient, MRI of the brain showed age-related changes, there was no acute abnormality  Also evidence of chronic left sphenoid sinusitis  Please advise patient if she has any signs of sinus infection, nasal congestion, nasal discharge, may consider ENT evaluation   IMPRESSION: This MRI of the brain without contrast shows the following: Mild generalized cortical atrophy, unchanged compared to the 2022 MRI Small number of T2/FLAIR hyperintense foci in the cerebral hemispheres consistent with minimal chronic microvascular ischemic change, unchanged compared to the 2022 MRI and normal for age. Left chronic sphenoid sinusitis No acute findings.

## 2023-04-29 ENCOUNTER — Emergency Department (HOSPITAL_COMMUNITY): Payer: PPO

## 2023-04-29 ENCOUNTER — Encounter (HOSPITAL_COMMUNITY): Payer: Self-pay | Admitting: Emergency Medicine

## 2023-04-29 ENCOUNTER — Inpatient Hospital Stay (HOSPITAL_COMMUNITY)
Admission: EM | Admit: 2023-04-29 | Discharge: 2023-05-03 | DRG: 871 | Disposition: A | Discharge status: Home Health | Payer: PPO | Attending: Internal Medicine | Admitting: Internal Medicine

## 2023-04-29 ENCOUNTER — Other Ambulatory Visit: Payer: Self-pay

## 2023-04-29 DIAGNOSIS — I4891 Unspecified atrial fibrillation: Secondary | ICD-10-CM | POA: Diagnosis present

## 2023-04-29 DIAGNOSIS — G40909 Epilepsy, unspecified, not intractable, without status epilepticus: Secondary | ICD-10-CM | POA: Diagnosis present

## 2023-04-29 DIAGNOSIS — Z430 Encounter for attention to tracheostomy: Secondary | ICD-10-CM

## 2023-04-29 DIAGNOSIS — J9611 Chronic respiratory failure with hypoxia: Secondary | ICD-10-CM | POA: Diagnosis present

## 2023-04-29 DIAGNOSIS — J690 Pneumonitis due to inhalation of food and vomit: Secondary | ICD-10-CM | POA: Diagnosis present

## 2023-04-29 DIAGNOSIS — J386 Stenosis of larynx: Secondary | ICD-10-CM | POA: Diagnosis present

## 2023-04-29 DIAGNOSIS — Z7901 Long term (current) use of anticoagulants: Secondary | ICD-10-CM

## 2023-04-29 DIAGNOSIS — I252 Old myocardial infarction: Secondary | ICD-10-CM

## 2023-04-29 DIAGNOSIS — J9612 Chronic respiratory failure with hypercapnia: Secondary | ICD-10-CM | POA: Diagnosis present

## 2023-04-29 DIAGNOSIS — K3184 Gastroparesis: Secondary | ICD-10-CM | POA: Diagnosis present

## 2023-04-29 DIAGNOSIS — Z888 Allergy status to other drugs, medicaments and biological substances status: Secondary | ICD-10-CM

## 2023-04-29 DIAGNOSIS — E039 Hypothyroidism, unspecified: Secondary | ICD-10-CM | POA: Diagnosis present

## 2023-04-29 DIAGNOSIS — I110 Hypertensive heart disease with heart failure: Secondary | ICD-10-CM | POA: Diagnosis present

## 2023-04-29 DIAGNOSIS — Z90710 Acquired absence of both cervix and uterus: Secondary | ICD-10-CM

## 2023-04-29 DIAGNOSIS — K582 Mixed irritable bowel syndrome: Secondary | ICD-10-CM | POA: Diagnosis present

## 2023-04-29 DIAGNOSIS — R0602 Shortness of breath: Secondary | ICD-10-CM | POA: Diagnosis not present

## 2023-04-29 DIAGNOSIS — Z8249 Family history of ischemic heart disease and other diseases of the circulatory system: Secondary | ICD-10-CM

## 2023-04-29 DIAGNOSIS — Z1152 Encounter for screening for COVID-19: Secondary | ICD-10-CM

## 2023-04-29 DIAGNOSIS — Z7984 Long term (current) use of oral hypoglycemic drugs: Secondary | ICD-10-CM

## 2023-04-29 DIAGNOSIS — R6510 Systemic inflammatory response syndrome (SIRS) of non-infectious origin without acute organ dysfunction: Principal | ICD-10-CM | POA: Diagnosis present

## 2023-04-29 DIAGNOSIS — F0393 Unspecified dementia, unspecified severity, with mood disturbance: Secondary | ICD-10-CM | POA: Diagnosis present

## 2023-04-29 DIAGNOSIS — I480 Paroxysmal atrial fibrillation: Secondary | ICD-10-CM | POA: Diagnosis present

## 2023-04-29 DIAGNOSIS — E872 Acidosis, unspecified: Secondary | ICD-10-CM | POA: Diagnosis present

## 2023-04-29 DIAGNOSIS — Z961 Presence of intraocular lens: Secondary | ICD-10-CM | POA: Diagnosis present

## 2023-04-29 DIAGNOSIS — Z885 Allergy status to narcotic agent status: Secondary | ICD-10-CM

## 2023-04-29 DIAGNOSIS — Z9842 Cataract extraction status, left eye: Secondary | ICD-10-CM

## 2023-04-29 DIAGNOSIS — G8929 Other chronic pain: Secondary | ICD-10-CM | POA: Diagnosis present

## 2023-04-29 DIAGNOSIS — Q438 Other specified congenital malformations of intestine: Secondary | ICD-10-CM

## 2023-04-29 DIAGNOSIS — J189 Pneumonia, unspecified organism: Principal | ICD-10-CM

## 2023-04-29 DIAGNOSIS — M1990 Unspecified osteoarthritis, unspecified site: Secondary | ICD-10-CM | POA: Diagnosis present

## 2023-04-29 DIAGNOSIS — Z79890 Hormone replacement therapy (postmenopausal): Secondary | ICD-10-CM

## 2023-04-29 DIAGNOSIS — F0394 Unspecified dementia, unspecified severity, with anxiety: Secondary | ICD-10-CM | POA: Diagnosis present

## 2023-04-29 DIAGNOSIS — K219 Gastro-esophageal reflux disease without esophagitis: Secondary | ICD-10-CM | POA: Diagnosis present

## 2023-04-29 DIAGNOSIS — Z820 Family history of epilepsy and other diseases of the nervous system: Secondary | ICD-10-CM

## 2023-04-29 DIAGNOSIS — E1143 Type 2 diabetes mellitus with diabetic autonomic (poly)neuropathy: Secondary | ICD-10-CM | POA: Diagnosis present

## 2023-04-29 DIAGNOSIS — I5042 Chronic combined systolic (congestive) and diastolic (congestive) heart failure: Secondary | ICD-10-CM | POA: Diagnosis present

## 2023-04-29 DIAGNOSIS — K2270 Barrett's esophagus without dysplasia: Secondary | ICD-10-CM | POA: Diagnosis present

## 2023-04-29 DIAGNOSIS — E876 Hypokalemia: Secondary | ICD-10-CM | POA: Diagnosis present

## 2023-04-29 DIAGNOSIS — G4733 Obstructive sleep apnea (adult) (pediatric): Secondary | ICD-10-CM | POA: Diagnosis present

## 2023-04-29 DIAGNOSIS — F419 Anxiety disorder, unspecified: Secondary | ICD-10-CM | POA: Diagnosis present

## 2023-04-29 DIAGNOSIS — E119 Type 2 diabetes mellitus without complications: Secondary | ICD-10-CM

## 2023-04-29 DIAGNOSIS — N289 Disorder of kidney and ureter, unspecified: Secondary | ICD-10-CM | POA: Diagnosis present

## 2023-04-29 DIAGNOSIS — E785 Hyperlipidemia, unspecified: Secondary | ICD-10-CM | POA: Diagnosis present

## 2023-04-29 DIAGNOSIS — Z79899 Other long term (current) drug therapy: Secondary | ICD-10-CM

## 2023-04-29 DIAGNOSIS — Z9841 Cataract extraction status, right eye: Secondary | ICD-10-CM

## 2023-04-29 DIAGNOSIS — Z794 Long term (current) use of insulin: Secondary | ICD-10-CM

## 2023-04-29 DIAGNOSIS — M545 Low back pain, unspecified: Secondary | ICD-10-CM | POA: Diagnosis present

## 2023-04-29 DIAGNOSIS — I482 Chronic atrial fibrillation, unspecified: Secondary | ICD-10-CM | POA: Diagnosis present

## 2023-04-29 DIAGNOSIS — A419 Sepsis, unspecified organism: Secondary | ICD-10-CM | POA: Diagnosis not present

## 2023-04-29 DIAGNOSIS — F32A Depression, unspecified: Secondary | ICD-10-CM | POA: Diagnosis present

## 2023-04-29 DIAGNOSIS — I10 Essential (primary) hypertension: Secondary | ICD-10-CM | POA: Diagnosis present

## 2023-04-29 DIAGNOSIS — Z930 Tracheostomy status: Secondary | ICD-10-CM

## 2023-04-29 DIAGNOSIS — Z8701 Personal history of pneumonia (recurrent): Secondary | ICD-10-CM

## 2023-04-29 DIAGNOSIS — Z87891 Personal history of nicotine dependence: Secondary | ICD-10-CM

## 2023-04-29 LAB — COMPREHENSIVE METABOLIC PANEL
ALT: 20 U/L (ref 0–44)
AST: 22 U/L (ref 15–41)
Albumin: 4.1 g/dL (ref 3.5–5.0)
Alkaline Phosphatase: 86 U/L (ref 38–126)
Anion gap: 10 (ref 5–15)
BUN: 35 mg/dL — ABNORMAL HIGH (ref 8–23)
CO2: 20 mmol/L — ABNORMAL LOW (ref 22–32)
Calcium: 9.3 mg/dL (ref 8.9–10.3)
Chloride: 110 mmol/L (ref 98–111)
Creatinine, Ser: 1.2 mg/dL — ABNORMAL HIGH (ref 0.44–1.00)
GFR, Estimated: 48 mL/min — ABNORMAL LOW (ref >60)
Glucose, Bld: 171 mg/dL — ABNORMAL HIGH (ref 70–99)
Potassium: 3.6 mmol/L (ref 3.5–5.1)
Sodium: 140 mmol/L (ref 135–145)
Total Bilirubin: 0.9 mg/dL (ref 0.3–1.2)
Total Protein: 7.4 g/dL (ref 6.5–8.1)

## 2023-04-29 LAB — TROPONIN I (HIGH SENSITIVITY)
Troponin I (High Sensitivity): 38 ng/L — ABNORMAL HIGH (ref <18)
Troponin I (High Sensitivity): 35 ng/L — ABNORMAL HIGH (ref <18)

## 2023-04-29 LAB — CBC
HCT: 41.9 % (ref 36.0–46.0)
Hemoglobin: 13.5 g/dL (ref 12.0–15.0)
MCH: 31.5 pg (ref 26.0–34.0)
MCHC: 32.2 g/dL (ref 30.0–36.0)
MCV: 97.9 fL (ref 80.0–100.0)
Platelets: 163 10*3/uL (ref 150–400)
RBC: 4.28 MIL/uL (ref 3.87–5.11)
RDW: 14.2 % (ref 11.5–15.5)
WBC: 14.2 10*3/uL — ABNORMAL HIGH (ref 4.0–10.5)
nRBC: 0 % (ref 0.0–0.2)

## 2023-04-29 LAB — RESP PANEL BY RT-PCR (RSV, FLU A&B, COVID)  RVPGX2
Influenza A by PCR: NEGATIVE
Influenza B by PCR: NEGATIVE
Resp Syncytial Virus by PCR: NEGATIVE
SARS Coronavirus 2 by RT PCR: NEGATIVE

## 2023-04-29 LAB — LACTIC ACID, PLASMA: Lactic Acid, Venous: 1.1 mmol/L (ref 0.5–1.9)

## 2023-04-29 LAB — LIPASE, BLOOD: Lipase: 36 U/L (ref 11–51)

## 2023-04-29 MED ORDER — SODIUM CHLORIDE 0.9 % IV SOLN
1.0000 g | Freq: Once | INTRAVENOUS | Status: AC
  Administered 2023-04-29: 1 g via INTRAVENOUS
  Filled 2023-04-29 (×2): qty 10

## 2023-04-29 MED ORDER — VANCOMYCIN HCL 1500 MG/300ML IV SOLN
1500.0000 mg | Freq: Once | INTRAVENOUS | Status: AC
  Administered 2023-04-29: 1500 mg via INTRAVENOUS
  Filled 2023-04-29: qty 300

## 2023-04-29 MED ORDER — VANCOMYCIN HCL IN DEXTROSE 1-5 GM/200ML-% IV SOLN: 1000.0000 mg | Freq: Two times a day (BID) | INTRAVENOUS | Status: DC

## 2023-04-29 MED ORDER — IOHEXOL 300 MG/ML  SOLN
75.0000 mL | Freq: Once | INTRAMUSCULAR | Status: AC | PRN
  Administered 2023-04-29: 75 mL via INTRAVENOUS

## 2023-04-29 NOTE — Progress Notes (Signed)
Pharmacy Antibiotic Note  [name] is a 72 y.o. female admitted on 04/29/2023 with pneumonia. PMH significant for HTN, T2DM, GERD, MI (2022), and HLD. Patient presenting with increased shortness of breath and weakness. In ED, patient remains afebrile (tmax 100.2) with WBC 14.2. Pharmacy has been consulted for vancomycin dosing.  Plan: Give vancomycin load of 1500 mg x1 Start vancomycin 1000 mg IV every 24 hours (eAUC 470.4, Cmin 12.7, Scr 1.2, IBW, Vd 0.72 L/kg) Monitor renal function, clinical status, culture data, and LOT F/u MRSA PCR  Height: 5\' 6"  (167.6 cm) Weight: 81.2 kg (179 lb 0.2 oz) IBW/kg (Calculated) : 59.3  Temp (24hrs), Avg:100.2 F (37.9 C), Min:100.2 F (37.9 C), Max:100.2 F (37.9 C)  Recent Labs  Lab 04/29/23 1843  WBC 14.2*  CREATININE 1.20*  LATICACIDVEN 1.1    Estimated Creatinine Clearance: 44.2 mL/min (A) (by C-G formula based on SCr of 1.2 mg/dL (H)).    Allergies  Allergen Reactions   Codeine Nausea And Vomiting and Other (See Comments)    HALLUCINATIONS/  Tolerates Tramadol      Dilaudid [Hydromorphone Hcl] Other (See Comments)    Made her pass out/ Tolerates Tramadol     Reglan [Metoclopramide] Other (See Comments)    DYSKINESIA    Hyoscyamine Other (See Comments)    MADE DIARRHEA WORSE   Antimicrobials this admission: Vancomycin 10/26 >>  Cefepime 10/26 x1  Dose adjustments this admission: N/A  Microbiology results: 10/26 BCx: pending 10/26 MRSA PCR: pending  Thank you for involving pharmacy in this patient's care.   Rockwell Alexandria, PharmD Clinical Pharmacist 04/29/2023 9:44 PM

## 2023-04-29 NOTE — ED Notes (Signed)
Patient brought in after vomiting. I placed her on ATC at 30%. She wears 4lpm at home. She also wears Bipap at night for CO2 retention. She has been doing this for years and it works well for her. She has a 6 shiley cuffed but is currently deflated. She states she feels better. SPO2 is 97%

## 2023-04-29 NOTE — ED Notes (Signed)
Pt had large BM. Cleaned and changed into new brief

## 2023-04-29 NOTE — ED Notes (Addendum)
Called RT  Pt stated daughter last suctioned 0530-6pm ish 98% with trach collar on 4LPM O2  Pt needs suctioning   Daughter stated that pt vomited "a volcano out of her mouth" and that is when her trach had increased discharge

## 2023-04-29 NOTE — ED Triage Notes (Signed)
Pt arrived via [facility] from home c/o sob/difficulty breathing, generalized weakness/tired x "a few days" vomited today and had sudden onset of dyspnea. Pt has a trach and EMS suctioned with some improvement Hx a fib 92% Spo2 per EMS

## 2023-04-30 DIAGNOSIS — I10 Essential (primary) hypertension: Secondary | ICD-10-CM

## 2023-04-30 DIAGNOSIS — J386 Stenosis of larynx: Secondary | ICD-10-CM | POA: Diagnosis present

## 2023-04-30 DIAGNOSIS — Z1152 Encounter for screening for COVID-19: Secondary | ICD-10-CM | POA: Diagnosis not present

## 2023-04-30 DIAGNOSIS — F419 Anxiety disorder, unspecified: Secondary | ICD-10-CM

## 2023-04-30 DIAGNOSIS — Z8719 Personal history of other diseases of the digestive system: Secondary | ICD-10-CM | POA: Diagnosis not present

## 2023-04-30 DIAGNOSIS — R6510 Systemic inflammatory response syndrome (SIRS) of non-infectious origin without acute organ dysfunction: Secondary | ICD-10-CM | POA: Diagnosis not present

## 2023-04-30 DIAGNOSIS — E039 Hypothyroidism, unspecified: Secondary | ICD-10-CM | POA: Diagnosis present

## 2023-04-30 DIAGNOSIS — I4811 Longstanding persistent atrial fibrillation: Secondary | ICD-10-CM

## 2023-04-30 DIAGNOSIS — J9611 Chronic respiratory failure with hypoxia: Secondary | ICD-10-CM | POA: Diagnosis present

## 2023-04-30 DIAGNOSIS — I482 Chronic atrial fibrillation, unspecified: Secondary | ICD-10-CM | POA: Diagnosis present

## 2023-04-30 DIAGNOSIS — Q438 Other specified congenital malformations of intestine: Secondary | ICD-10-CM | POA: Diagnosis not present

## 2023-04-30 DIAGNOSIS — I110 Hypertensive heart disease with heart failure: Secondary | ICD-10-CM | POA: Diagnosis present

## 2023-04-30 DIAGNOSIS — K219 Gastro-esophageal reflux disease without esophagitis: Secondary | ICD-10-CM | POA: Diagnosis present

## 2023-04-30 DIAGNOSIS — K529 Noninfective gastroenteritis and colitis, unspecified: Secondary | ICD-10-CM | POA: Diagnosis not present

## 2023-04-30 DIAGNOSIS — R112 Nausea with vomiting, unspecified: Secondary | ICD-10-CM | POA: Diagnosis not present

## 2023-04-30 DIAGNOSIS — E785 Hyperlipidemia, unspecified: Secondary | ICD-10-CM | POA: Diagnosis present

## 2023-04-30 DIAGNOSIS — F32A Depression, unspecified: Secondary | ICD-10-CM

## 2023-04-30 DIAGNOSIS — F0393 Unspecified dementia, unspecified severity, with mood disturbance: Secondary | ICD-10-CM | POA: Diagnosis present

## 2023-04-30 DIAGNOSIS — I5042 Chronic combined systolic (congestive) and diastolic (congestive) heart failure: Secondary | ICD-10-CM | POA: Diagnosis present

## 2023-04-30 DIAGNOSIS — E876 Hypokalemia: Secondary | ICD-10-CM | POA: Diagnosis present

## 2023-04-30 DIAGNOSIS — I252 Old myocardial infarction: Secondary | ICD-10-CM | POA: Diagnosis not present

## 2023-04-30 DIAGNOSIS — A419 Sepsis, unspecified organism: Secondary | ICD-10-CM | POA: Diagnosis present

## 2023-04-30 DIAGNOSIS — I480 Paroxysmal atrial fibrillation: Secondary | ICD-10-CM | POA: Diagnosis present

## 2023-04-30 DIAGNOSIS — Z930 Tracheostomy status: Secondary | ICD-10-CM | POA: Diagnosis not present

## 2023-04-30 DIAGNOSIS — E782 Mixed hyperlipidemia: Secondary | ICD-10-CM

## 2023-04-30 DIAGNOSIS — Z794 Long term (current) use of insulin: Secondary | ICD-10-CM | POA: Diagnosis not present

## 2023-04-30 DIAGNOSIS — J9612 Chronic respiratory failure with hypercapnia: Secondary | ICD-10-CM | POA: Diagnosis present

## 2023-04-30 DIAGNOSIS — F0394 Unspecified dementia, unspecified severity, with anxiety: Secondary | ICD-10-CM | POA: Diagnosis present

## 2023-04-30 DIAGNOSIS — J690 Pneumonitis due to inhalation of food and vomit: Secondary | ICD-10-CM | POA: Diagnosis present

## 2023-04-30 DIAGNOSIS — R0602 Shortness of breath: Secondary | ICD-10-CM | POA: Diagnosis present

## 2023-04-30 DIAGNOSIS — G40909 Epilepsy, unspecified, not intractable, without status epilepticus: Secondary | ICD-10-CM | POA: Diagnosis present

## 2023-04-30 DIAGNOSIS — E1143 Type 2 diabetes mellitus with diabetic autonomic (poly)neuropathy: Secondary | ICD-10-CM | POA: Diagnosis present

## 2023-04-30 DIAGNOSIS — E872 Acidosis, unspecified: Secondary | ICD-10-CM | POA: Diagnosis present

## 2023-04-30 LAB — CBC WITH DIFFERENTIAL/PLATELET
Abs Immature Granulocytes: 0.11 10*3/uL — ABNORMAL HIGH (ref 0.00–0.07)
Basophils Absolute: 0.1 10*3/uL (ref 0.0–0.1)
Basophils Relative: 0 %
Eosinophils Absolute: 0 10*3/uL (ref 0.0–0.5)
Eosinophils Relative: 0 %
HCT: 38.4 % (ref 36.0–46.0)
Hemoglobin: 12.2 g/dL (ref 12.0–15.0)
Immature Granulocytes: 1 %
Lymphocytes Relative: 14 %
Lymphs Abs: 2.4 10*3/uL (ref 0.7–4.0)
MCH: 30.6 pg (ref 26.0–34.0)
MCHC: 31.8 g/dL (ref 30.0–36.0)
MCV: 96.2 fL (ref 80.0–100.0)
Monocytes Absolute: 1.6 10*3/uL — ABNORMAL HIGH (ref 0.1–1.0)
Monocytes Relative: 9 %
Neutro Abs: 13.2 10*3/uL — ABNORMAL HIGH (ref 1.7–7.7)
Neutrophils Relative %: 76 %
Platelets: 148 10*3/uL — ABNORMAL LOW (ref 150–400)
RBC: 3.99 MIL/uL (ref 3.87–5.11)
RDW: 14.4 % (ref 11.5–15.5)
WBC: 17.4 10*3/uL — ABNORMAL HIGH (ref 4.0–10.5)
nRBC: 0 % (ref 0.0–0.2)

## 2023-04-30 LAB — COMPREHENSIVE METABOLIC PANEL
ALT: 17 U/L (ref 0–44)
AST: 19 U/L (ref 15–41)
Albumin: 3.7 g/dL (ref 3.5–5.0)
Alkaline Phosphatase: 79 U/L (ref 38–126)
Anion gap: 15 (ref 5–15)
BUN: 32 mg/dL — ABNORMAL HIGH (ref 8–23)
CO2: 15 mmol/L — ABNORMAL LOW (ref 22–32)
Calcium: 9 mg/dL (ref 8.9–10.3)
Chloride: 110 mmol/L (ref 98–111)
Creatinine, Ser: 1.22 mg/dL — ABNORMAL HIGH (ref 0.44–1.00)
GFR, Estimated: 47 mL/min — ABNORMAL LOW (ref >60)
Glucose, Bld: 172 mg/dL — ABNORMAL HIGH (ref 70–99)
Potassium: 3.2 mmol/L — ABNORMAL LOW (ref 3.5–5.1)
Sodium: 140 mmol/L (ref 135–145)
Total Bilirubin: 1 mg/dL (ref 0.3–1.2)
Total Protein: 6.8 g/dL (ref 6.5–8.1)

## 2023-04-30 LAB — MAGNESIUM: Magnesium: 2.1 mg/dL (ref 1.7–2.4)

## 2023-04-30 LAB — GLUCOSE, CAPILLARY
Glucose-Capillary: 145 mg/dL — ABNORMAL HIGH (ref 70–99)
Glucose-Capillary: 211 mg/dL — ABNORMAL HIGH (ref 70–99)

## 2023-04-30 LAB — CBG MONITORING, ED
Glucose-Capillary: 170 mg/dL — ABNORMAL HIGH (ref 70–99)
Glucose-Capillary: 135 mg/dL — ABNORMAL HIGH (ref 70–99)

## 2023-04-30 LAB — TSH: TSH: 0.065 u[IU]/mL — ABNORMAL LOW (ref 0.350–4.500)

## 2023-04-30 LAB — PROCALCITONIN: Procalcitonin: 0.13 ng/mL

## 2023-04-30 MED ORDER — METRONIDAZOLE 500 MG PO TABS
500.0000 mg | ORAL_TABLET | Freq: Two times a day (BID) | ORAL | Status: DC
  Administered 2023-04-30 – 2023-05-03 (×7): 500 mg via ORAL
  Filled 2023-04-30 (×7): qty 1

## 2023-04-30 MED ORDER — LEVOTHYROXINE SODIUM 50 MCG PO TABS
175.0000 ug | ORAL_TABLET | Freq: Every day | ORAL | Status: DC
Start: 2023-04-30 — End: 2023-04-30
  Administered 2023-04-30: 175 ug via ORAL
  Filled 2023-04-30 (×2): qty 4

## 2023-04-30 MED ORDER — LEVETIRACETAM 500 MG PO TABS
500.0000 mg | ORAL_TABLET | Freq: Two times a day (BID) | ORAL | Status: DC
  Administered 2023-04-30 – 2023-05-03 (×8): 500 mg via ORAL
  Filled 2023-04-30 (×8): qty 1

## 2023-04-30 MED ORDER — METOPROLOL TARTRATE 25 MG PO TABS
25.0000 mg | ORAL_TABLET | Freq: Two times a day (BID) | ORAL | Status: DC
  Administered 2023-04-30: 25 mg via ORAL
  Filled 2023-04-30: qty 1

## 2023-04-30 MED ORDER — LORAZEPAM 1 MG PO TABS
1.0000 mg | ORAL_TABLET | Freq: Every day | ORAL | Status: DC
  Administered 2023-04-30 – 2023-05-02 (×3): 1 mg via ORAL
  Filled 2023-04-30 (×3): qty 1

## 2023-04-30 MED ORDER — IPRATROPIUM-ALBUTEROL 0.5-2.5 (3) MG/3ML IN SOLN: 3.0000 mL | RESPIRATORY_TRACT | Status: DC | PRN

## 2023-04-30 MED ORDER — TRAMADOL HCL 50 MG PO TABS
50.0000 mg | ORAL_TABLET | Freq: Once | ORAL | Status: AC
  Administered 2023-04-30: 50 mg via ORAL
  Filled 2023-04-30: qty 1

## 2023-04-30 MED ORDER — VANCOMYCIN HCL IN DEXTROSE 1-5 GM/200ML-% IV SOLN
1000.0000 mg | INTRAVENOUS | Status: DC
  Administered 2023-04-30: 1000 mg via INTRAVENOUS
  Filled 2023-04-30: qty 200

## 2023-04-30 MED ORDER — INSULIN ASPART 100 UNIT/ML IJ SOLN
0.0000 [IU] | Freq: Every day | INTRAMUSCULAR | Status: DC
  Administered 2023-04-30: 2 [IU] via SUBCUTANEOUS

## 2023-04-30 MED ORDER — ATORVASTATIN CALCIUM 40 MG PO TABS
40.0000 mg | ORAL_TABLET | Freq: Every day | ORAL | Status: DC
  Administered 2023-04-30 – 2023-05-03 (×4): 40 mg via ORAL
  Filled 2023-04-30 (×4): qty 1

## 2023-04-30 MED ORDER — SODIUM CHLORIDE 0.9 % IV SOLN
1.0000 g | INTRAVENOUS | Status: DC
  Administered 2023-04-30 – 2023-05-02 (×3): 1 g via INTRAVENOUS
  Filled 2023-04-30 (×3): qty 10

## 2023-04-30 MED ORDER — SODIUM BICARBONATE 650 MG PO TABS
650.0000 mg | ORAL_TABLET | Freq: Two times a day (BID) | ORAL | Status: DC
Start: 2023-04-30 — End: 2023-05-01
  Administered 2023-04-30 – 2023-05-01 (×3): 650 mg via ORAL
  Filled 2023-04-30 (×3): qty 1

## 2023-04-30 MED ORDER — INSULIN ASPART 100 UNIT/ML IJ SOLN
0.0000 [IU] | Freq: Three times a day (TID) | INTRAMUSCULAR | Status: DC
  Administered 2023-04-30: 3 [IU] via SUBCUTANEOUS
  Administered 2023-04-30 (×2): 2 [IU] via SUBCUTANEOUS
  Administered 2023-05-01 – 2023-05-03 (×7): 3 [IU] via SUBCUTANEOUS
  Filled 2023-04-30 (×2): qty 1

## 2023-04-30 MED ORDER — CHLORHEXIDINE GLUCONATE CLOTH 2 % EX PADS
6.0000 | MEDICATED_PAD | Freq: Every day | CUTANEOUS | Status: DC
  Administered 2023-04-30 – 2023-05-03 (×4): 6 via TOPICAL

## 2023-04-30 MED ORDER — MELATONIN 3 MG PO TABS
6.0000 mg | ORAL_TABLET | Freq: Every evening | ORAL | Status: DC | PRN
  Administered 2023-05-03: 6 mg via ORAL
  Filled 2023-04-30: qty 2

## 2023-04-30 MED ORDER — POTASSIUM CHLORIDE CRYS ER 20 MEQ PO TBCR
40.0000 meq | EXTENDED_RELEASE_TABLET | Freq: Once | ORAL | Status: AC
  Administered 2023-04-30: 40 meq via ORAL
  Filled 2023-04-30: qty 2

## 2023-04-30 MED ORDER — GABAPENTIN 100 MG PO CAPS
100.0000 mg | ORAL_CAPSULE | Freq: Every day | ORAL | Status: DC
  Administered 2023-04-30 – 2023-05-02 (×3): 100 mg via ORAL
  Filled 2023-04-30 (×3): qty 1

## 2023-04-30 MED ORDER — PANTOPRAZOLE SODIUM 40 MG PO TBEC
40.0000 mg | DELAYED_RELEASE_TABLET | Freq: Every day | ORAL | Status: DC
  Administered 2023-04-30 – 2023-05-02 (×3): 40 mg via ORAL
  Filled 2023-04-30 (×3): qty 1

## 2023-04-30 MED ORDER — BUSPIRONE HCL 5 MG PO TABS
10.0000 mg | ORAL_TABLET | Freq: Two times a day (BID) | ORAL | Status: DC
  Administered 2023-04-30 – 2023-05-03 (×8): 10 mg via ORAL
  Filled 2023-04-30 (×8): qty 2

## 2023-04-30 MED ORDER — DONEPEZIL HCL 5 MG PO TABS
5.0000 mg | ORAL_TABLET | Freq: Every day | ORAL | Status: DC
  Administered 2023-04-30 – 2023-05-02 (×3): 5 mg via ORAL
  Filled 2023-04-30 (×3): qty 1

## 2023-04-30 MED ORDER — ACETAMINOPHEN 325 MG PO TABS
650.0000 mg | ORAL_TABLET | Freq: Four times a day (QID) | ORAL | Status: DC | PRN
  Administered 2023-04-30 – 2023-05-02 (×3): 650 mg via ORAL
  Filled 2023-04-30 (×4): qty 2

## 2023-04-30 MED ORDER — ACETAMINOPHEN 650 MG RE SUPP: 650.0000 mg | Freq: Four times a day (QID) | RECTAL | Status: DC | PRN

## 2023-04-30 MED ORDER — SERTRALINE HCL 50 MG PO TABS
25.0000 mg | ORAL_TABLET | Freq: Every day | ORAL | Status: DC
  Administered 2023-04-30 – 2023-05-02 (×3): 25 mg via ORAL
  Filled 2023-04-30 (×3): qty 1

## 2023-04-30 MED ORDER — INSULIN DETEMIR 100 UNIT/ML ~~LOC~~ SOLN
10.0000 [IU] | Freq: Every day | SUBCUTANEOUS | Status: DC
  Administered 2023-04-30 – 2023-05-02 (×3): 10 [IU] via SUBCUTANEOUS
  Filled 2023-04-30 (×4): qty 0.1

## 2023-04-30 MED ORDER — APIXABAN 5 MG PO TABS
5.0000 mg | ORAL_TABLET | Freq: Two times a day (BID) | ORAL | Status: DC
  Administered 2023-04-30 – 2023-05-03 (×8): 5 mg via ORAL
  Filled 2023-04-30 (×8): qty 1

## 2023-04-30 MED ORDER — METOPROLOL TARTRATE 25 MG PO TABS
12.5000 mg | ORAL_TABLET | Freq: Two times a day (BID) | ORAL | Status: DC
  Administered 2023-04-30 – 2023-05-02 (×5): 12.5 mg via ORAL
  Filled 2023-04-30 (×5): qty 1

## 2023-04-30 MED ORDER — ONDANSETRON HCL 4 MG/2ML IJ SOLN
4.0000 mg | Freq: Four times a day (QID) | INTRAMUSCULAR | Status: DC | PRN
  Administered 2023-05-01 – 2023-05-02 (×2): 4 mg via INTRAVENOUS
  Filled 2023-04-30 (×2): qty 2

## 2023-04-30 MED ORDER — ONDANSETRON HCL 4 MG PO TABS
4.0000 mg | ORAL_TABLET | Freq: Four times a day (QID) | ORAL | Status: DC | PRN
  Administered 2023-05-02: 4 mg via ORAL
  Filled 2023-04-30: qty 1

## 2023-04-30 MED ORDER — ALBUTEROL SULFATE (2.5 MG/3ML) 0.083% IN NEBU: 2.5000 mg | INHALATION_SOLUTION | RESPIRATORY_TRACT | Status: DC | PRN

## 2023-04-30 MED ORDER — OXYCODONE HCL 5 MG PO TABS
5.0000 mg | ORAL_TABLET | ORAL | Status: DC | PRN
  Administered 2023-04-30 – 2023-05-03 (×5): 5 mg via ORAL
  Filled 2023-04-30 (×5): qty 1

## 2023-04-30 NOTE — Assessment & Plan Note (Signed)
-   Patient is tachycardic at 104, has a leukocytosis of 14.2 - Endorgan damage with a slightly elevated troponin that is downtrending from 38-35 - CT shows opacities in the lung with right greater than left possibly aspiration possibly chronic, and changes in the colon which could be infectious, inflammatory, or ischemic colitis.  Patient is pain-free at this time so ischemic colitis is very low on the differential - With patient being SIRS positive we will continue to treat with broad-spectrum antibiotics - N.p.o. for bowel rest - Zofran for nausea control - COVID is negative - Blood cultures pending - No complaints of urinary symptoms - Continue to monitor

## 2023-04-30 NOTE — Assessment & Plan Note (Signed)
Continue metoprolol. 

## 2023-04-30 NOTE — Assessment & Plan Note (Signed)
-   Continue reduced dose of basal insulin - Sliding scale coverage - Monitor CBG

## 2023-04-30 NOTE — Assessment & Plan Note (Signed)
Continue statin. 

## 2023-04-30 NOTE — Assessment & Plan Note (Signed)
-   Continue BuSpar 

## 2023-04-30 NOTE — Progress Notes (Addendum)
[facility] PROGRESS NOTE  [name] (DOB: April 10, 1951) ZOX:[id number] PCP: Rica Records, FNP  Brief Narrative: [name] is a 72 y.o. female with a history of chronic tracheostomy dependence s/p pneumonia 2022, nocturnal VDRF, chronic combined HFrEF, T2DM, chronic afib, seizure disorder, among others who presented to the ED on 04/29/2023 from home with a few days of feeling unwell, mild abdominal discomfort for which her daughter/caretaker had scheduled GI appointment this coming week, and episodes of vomiting with subsequent respiratory distress. Work up in ED revealed leukocytosis (WBC 14.2k), low grade fever (100.38F), atrial fibrillation with mild tachycardia and modest demand myocardial ischemia (Troponin 38 > 35 without ischemic ECG changes or chest pain). CT suggested areas of colitis and right greater than left lung base infiltrates concerning for aspiration pneumonia. She was admitted this morning for sepsis due to aspiration pneumonia and nonspecified colitis.   Subjective: Denies abdominal pain or other concerns, slept ok. Daughter at bedside and RT at bedside.   Objective: BP (!) 101/49   Pulse 62   Temp 99.7 F (37.6 C) (Oral)   Resp (!) 22   Ht 5\' 6"  (1.676 m)   Wt 81.2 kg   SpO2 100%   BMI 28.89 kg/m   Gen: Elderly female in no distress Neck: Trach midline, no surrounding exudate  Pulm: Clear anteriorly but crackles at bases laterally  CV: Irreg irreg, rate in 80's, no pitting edema GI: Soft, not tender really at all, +BS Neuro: Alert and oriented, MAE.   Assessment & Plan: Aspiration pneumonia and pneumonitis, VDRF: - Supportive care for tracheostomy, nocturnal vent per RT appreciated.  - MRSA PCR has historically been negative and history more consistent with aspiration etiology. With renal impairment, will stop vancomycin. Start ceftriaxone and flagyl. Monitor closely.  - SLP evaluation  Colitis: ascending and proximal transverse as well  as sigmoid regions affected. This is not typical watershed distribution of ischemic colitis, though BP is soft. No bloody diarrhea, though pt has reported hx IBS and chronic diarrhea.  - Will check stool studies, empiric enteric precautions.  - Antiemetic prn.  - Cover with ceftriaxone, flagyl for now given her sepsis and for aspiration coverage as above.  - If unable to initiate diet or new concerning symptoms appear, may need GI consult. Otherwise, has follow up with Ermalinda Memos of [facility] 10/30.   Hypothyroidism: Synthroid dose is which is 2.61mcg/kg and TSH was suppressed last month, confirmed still to be low at 0.065 currently.  - Hold synthroid right now, will restart at lower dose and advise PCP follow up for TSH testing in 2-4 weeks.   - Continue home metoprolol  Chronic atrial fibrillation: Rate controlled, though BP is soft.  - Continue eliquis - Continue metoprolol, lower dose 25 > 12.5mg  BID to see how BP and HR respond.   NAGMA, hypokalemia: Likely due to diarrhea. Will supplement both.  [name], MD [facility] [email or web address] 04/30/2023, 8:58 AM

## 2023-04-30 NOTE — ED Notes (Signed)
Patient wears a trilogy vent for sleep. Patient placed on servo air for sleep. She appears to be doing well. Placed on 470 vt , f 16 , peep 5, 40 FiO2,. Patient still waiting to be moved to ICU.or stepdown. Her only need for higher care is because of vent used for sleep.

## 2023-04-30 NOTE — ED Provider Notes (Signed)
[place] EMERGENCY DEPARTMENT AT [facility] Provider Note   CSN: [id number] Arrival date & time: 04/29/23  1742     History  Chief Complaint  Patient presents with   Shortness of Breath    [name] is a 72 y.o. female.  72 year old female present emergency department with abdominal pain nausea vomiting and shortness of breath.  She is trach dependent.  Family notes that she has not been feeling well for the past several days.  She had several episodes of vomiting.  She wears 4 L of oxygen at home and BiPAP at night.  EMS reports copious secretions.   Shortness of Breath      Home Medications Prior to Admission medications   Medication Sig Start Date End Date Taking? Authorizing Provider  atorvastatin (LIPITOR) 40 MG tablet Take 1 tablet (40 mg total) by mouth daily. 03/29/23   Del Nigel Berthold, FNP  busPIRone (BUSPAR) 10 MG tablet Take 1 tablet (10 mg total) by mouth 2 (two) times daily. 04/07/23   Del Nigel Berthold, FNP  Cholecalciferol (VITAMIN D3) 50 MCG (2000 UT) capsule Take 1 capsule (2,000 Units total) by mouth daily. 01/09/23   Del Nigel Berthold, FNP  Continuous Glucose Sensor (FREESTYLE LIBRE 2 SENSOR) MISC FOR CONTINUOUS GLUCOSE MONITORING. 03/03/23   Billie Lade, MD  Continuous Glucose Sensor (FREESTYLE LIBRE 2 SENSOR) MISC Use daily 03/02/23   Del Newman Nip, Tenna Child, FNP  cyanocobalamin (VITAMIN B12) 500 MCG tablet Take 1 tablet (500 mcg total) by mouth daily. Patient taking differently: Take 500 mcg by mouth every other day. 12/28/22   Mauro Kaufmann, NP  dicyclomine (BENTYL) 10 MG capsule TAKE 1 CAPSULE BY MOUTH UP TO FOUR TIMES DAILY BEFORE MEALS AND AT BEDTIME (STOP IF CONSTIPATION) 03/24/23   Letta Median, PA-C  donepezil (ARICEPT) 5 MG tablet TAKE 1 TABLET BY MOUTH AT BEDTIME FOR dementia 02/21/23   Del Orbe Polanco, Tenna Child, FNP  ELIQUIS 5 MG TABS tablet TAKE 1 TABLET BY MOUTH TWICE DAILY AS A BLOOD THINNER 02/21/23    Del Newman Nip, Tenna Child, FNP  empagliflozin (JARDIANCE) 10 MG TABS tablet Take 1 tablet (10 mg total) by mouth daily. 01/09/23   Del Nigel Berthold, FNP  furosemide (LASIX) 40 MG tablet TAKE 1 TABLET BY MOUTH DAILY FOR FLUID 02/21/23   Del Newman Nip, [place], FNP  gabapentin (NEURONTIN) 100 MG capsule TAKE ONE CAPSULE BY MOUTH THREE TIMES DAILY FOR NEUROPATHIC PAIN 02/21/23   Del Newman Nip, Tenna Child, FNP  insulin glargine (LANTUS) 100 UNIT/ML injection Inject 0.2 mLs (20 Units total) into the skin at bedtime. 04/05/23   Roma Kayser, MD  ipratropium-albuterol (DUONEB) 0.5-2.5 (3) MG/3ML SOLN Take 3 mLs by nebulization every 4 (four) hours as needed (Asthma). 11/08/21   [provider]  levETIRAcetam (KEPPRA) 500 MG tablet TAKE 1 TABLET BY MOUTH TWICE DAILY FOR SEIZURE PREVENTION 03/02/23   Del Nigel Berthold, FNP  levothyroxine (SYNTHROID) 175 MCG tablet Take 1 tablet (175 mcg total) by mouth daily. 04/02/23   Del Nigel Berthold, FNP  loperamide (IMODIUM) 2 MG capsule Take by mouth. 07/09/21   [provider]  LORazepam (ATIVAN) 1 MG tablet TAKE 1/2 TO 1 TABLET BY MOUTH AT BEDTIME FOR SLEEP 03/02/23   Del Newman Nip, Tenna Child, FNP  melatonin 3 MG TABS tablet TAKE 1 TABLET BY MOUTH AT BEDTIME AS NEED FOR SLEEP 03/02/23   Del Nigel Berthold, FNP  metoprolol tartrate (  LOPRESSOR) 25 MG tablet TAKE 1 TABLET BY MOUTH TWICE DAILY FOR HIGH BLOOD PRESSURE OR HEART RATE CONTROL 03/02/23   Del Nigel Berthold, FNP  Multiple Vitamins-Minerals (MULTIVITAMIN WITH MINERALS) tablet Take 1 tablet by mouth daily. For Diabetics    [provider]  mupirocin ointment (BACTROBAN) 2 % Apply 1 Application topically 2 (two) times daily. 03/29/23   Del Nigel Berthold, FNP  nitroGLYCERIN (NITROSTAT) 0.4 MG SL tablet  03/08/22   [provider]  pantoprazole (PROTONIX) 40 MG tablet TAKE 1 TABLET BY MOUTH TWICE DAILY FOR acid reflux 03/02/23   Del Newman Nip,  Tenna Child, FNP  potassium chloride (KLOR-CON) 10 MEQ tablet Take 1 tablet (10 mEq total) by mouth once for 1 dose. 03/24/23 03/24/23  Del Nigel Berthold, FNP  sertraline (ZOLOFT) 25 MG tablet TAKE 1 TABLET BY MOUTH DAILY FOR depression 03/02/23   Del Newman Nip, Tenna Child, FNP  traMADol (ULTRAM) 50 MG tablet Take 1 tablet (50 mg total) by mouth 3 (three) times daily as needed for moderate pain or severe pain. 03/31/23   Del Nigel Berthold, FNP  trolamine salicylate (ASPERCREME) 10 % cream Apply 1 application. topically as needed (knee pain).    [provider]      Allergies    Codeine, Dilaudid [hydromorphone hcl], Reglan [metoclopramide], and Hyoscyamine    Review of Systems   Review of Systems  Respiratory:  Positive for shortness of breath.     Physical Exam Updated Vital Signs BP (!) 118/49   Pulse (!) 103   Temp 99.6 F (37.6 C) (Oral)   Resp 13   Ht 5\' 6"  (1.676 m)   Wt 81.2 kg   SpO2 96%   BMI 28.89 kg/m  Physical Exam Vitals and nursing note reviewed.  Constitutional:      General: She is not in acute distress.    Appearance: She is not toxic-appearing.  HENT:     Head: Normocephalic.  Cardiovascular:     Rate and Rhythm: Tachycardia present. Rhythm irregular.  Pulmonary:     Effort: Tachypnea present.     Comments: Trach collar in place.  Blow-by oxygen with oxygen saturations in the mid 90s.  Has coarse breath sounds throughout all lung fields.  No wheezing appreciated. Chest:     Chest wall: No mass or tenderness.  Abdominal:     Palpations: Abdomen is soft.     Tenderness: There is abdominal tenderness. There is no guarding.  Musculoskeletal:     Cervical back: Normal range of motion.     Right lower leg: No edema.     Left lower leg: No edema.  Skin:    General: Skin is warm and dry.  Neurological:     General: No focal deficit present.     Mental Status: She is alert.  Psychiatric:        Mood and Affect: Mood normal.        Behavior:  Behavior normal.     ED Results / Procedures / Treatments   Labs (all labs ordered are listed, but only abnormal results are displayed) Labs Reviewed  CBC - Abnormal; Notable for the following components:      Result Value   WBC 14.2 (*)    All other components within normal limits  COMPREHENSIVE METABOLIC PANEL - Abnormal; Notable for the following components:   CO2 20 (*)    Glucose, Bld 171 (*)    BUN 35 (*)    Creatinine,  Ser 1.20 (*)    GFR, Estimated 48 (*)    All other components within normal limits  TROPONIN I (HIGH SENSITIVITY) - Abnormal; Notable for the following components:   Troponin I (High Sensitivity) 38 (*)    All other components within normal limits  TROPONIN I (HIGH SENSITIVITY) - Abnormal; Notable for the following components:   Troponin I (High Sensitivity) 35 (*)    All other components within normal limits  RESP PANEL BY RT-PCR (RSV, FLU A&B, COVID)  [id number]  CULTURE, BLOOD (ROUTINE X 2)  CULTURE, BLOOD (ROUTINE X 2)  MRSA NEXT GEN BY PCR, NASAL  LIPASE, BLOOD  LACTIC ACID, PLASMA    EKG None  Radiology CT ABDOMEN PELVIS W CONTRAST  Result Date: 04/29/2023 CLINICAL DATA:  Sepsis EXAM: CT ABDOMEN AND PELVIS WITH CONTRAST TECHNIQUE: Multidetector CT imaging of the abdomen and pelvis was performed using the standard protocol following bolus administration of intravenous contrast. RADIATION DOSE REDUCTION: This exam was performed according to the departmental dose-optimization program which includes automated exposure control, adjustment of the mA and/or kV according to patient size and/or use of iterative reconstruction technique. CONTRAST:  75mL OMNIPAQUE IOHEXOL 300 MG/ML  SOLN COMPARISON:  01/06/2021 FINDINGS: Lower chest: Cardiomegaly. Heterogeneous airspace opacity and/or scarring of the right-greater-than-left lung bases (series 5, image 30). Small hiatal hernia. Hepatobiliary: No focal liver abnormality is seen. Status post cholecystectomy. No  biliary dilatation. Pancreas: Unremarkable. No pancreatic ductal dilatation or surrounding inflammatory changes. Spleen: Normal in size without significant abnormality. Adrenals/Urinary Tract: Adrenal glands are unremarkable. Kidneys are normal, without renal calculi, solid lesion, or hydronephrosis. Bladder is unremarkable. Stomach/Bowel: Stomach is within normal limits. Long segment colonic wall thickening involving the ascending and proximal transverse colon (series 3, image 27), as well as a separate segment of the distal sigmoid colon and rectum (series 2, image 66). Occasional sigmoid diverticula, which do not appear to be the nidus of inflammation. Vascular/Lymphatic: Aortic atherosclerosis. No enlarged abdominal or pelvic lymph nodes. Reproductive: Status post hysterectomy. Other: Small fat containing midline ventral hernia (series 2, image 60). No ascites. Musculoskeletal: No acute or significant osseous findings. Superior endplate wedge deformity of T12 status post vertebral cement augmentation (series 4, image 67). IMPRESSION: 1. Long segment colonic wall thickening involving the ascending and proximal transverse colon, as well as a separate segment of the distal sigmoid colon and rectum. Findings are consistent with nonspecific infectious, inflammatory, or ischemic colitis. 2. Heterogeneous airspace opacity and/or scarring of the right-greater-than-left lung bases, concerning for infection or aspiration perhaps acute on chronic. 3. Cardiomegaly. Aortic Atherosclerosis (ICD10-I70.0). Electronically Signed   By: Jearld Lesch M.D.   On: 04/29/2023 21:11   DG Chest Portable 1 View  Result Date: 04/29/2023 CLINICAL DATA:  Cough and shortness of breath EXAM: PORTABLE CHEST 1 VIEW COMPARISON:  09/24/2021 FINDINGS: Stable cardiomegaly. Mitral annular and aortic atherosclerotic calcification. Tracheostomy tip in the intrathoracic trachea. Diffuse interstitial coarsening similar to prior. Scarring or  atelectasis in the left mid lung. No pleural effusion or pneumothorax. IMPRESSION: Cardiomegaly and interstitial edema similar to prior. Electronically Signed   By: Minerva Fester M.D.   On: 04/29/2023 19:40    Procedures Procedures    Medications Ordered in ED Medications  vancomycin (VANCOREADY) IVPB 1500 mg/300 mL (1,500 mg Intravenous New Bag/Given 04/29/23 2335)  vancomycin (VANCOCIN) IVPB 1000 mg/200 mL premix (has no administration in time range)  traMADol (ULTRAM) tablet 50 mg (has no administration in time range)  iohexol (OMNIPAQUE) 300 MG/ML solution 75  mL (75 mLs Intravenous Contrast Given 04/29/23 2017)  ceFEPIme (MAXIPIME) 1 g in sodium chloride 0.9 % 100 mL IVPB (0 g Intravenous Stopped 04/29/23 2236)    ED Course/ Medical Decision Making/ A&P Clinical Course as of 04/30/23 0010  Sat Apr 29, 2023  1824 ENT note 03/24/23: "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 She was trached in July 2022 for pneumonia. She is a CO2 retainer with sleep apnea and has kept the trach for that reason. 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 Unfortunately the procedure and the subsequent vocal fold edema continued to cause an obstruction or restriction that does not allow her to use her Passy-Muir valve." [TY]  1947 DG Chest Portable 1 View IMPRESSION: Cardiomegaly and interstitial edema similar to prior.   [TY]  2120 CT ABDOMEN PELVIS W CONTRAST IMPRESSION: 1. Long segment colonic wall thickening involving the ascending and proximal transverse colon, as well as a separate segment of the distal sigmoid colon and rectum. Findings are consistent with nonspecific infectious, inflammatory, or ischemic colitis. 2. Heterogeneous airspace opacity and/or scarring of the right-greater-than-left lung bases, concerning for  infection or aspiration perhaps acute on chronic. 3. Cardiomegaly.   [TY]    Clinical Course User Index [TY] Coral Spikes, DO                                 Medical Decision Making 72 year old female presenting emergency department nausea vomiting and shortness of breath.  Trach dependent from prior pneumonia.  Febrile, tachycardic appears to be in A-fib, rate controlled.  Hemodynamically stable.  No overt sign of external infection.  Does have leukocytosis.  Lactate not significantly elevated.  No significant metabolic derangements.  Kidney function normal.  Lipase not elevated; pancreatitis unlikely.  She has no transaminitis to suggest hepatobiliary disease troponin mildly elevated, appears to be chronically so.  EKG with no ST segment changes indicate ischemia.  Given her fever and tachycardia concerned that she possibly had flu or COVID as an explanation of her shortness of breath and nausea vomiting.  However she was flu and COVID-negative.  CT scan with possible pneumonia as well as colitis.  Cover with antibiotics.  Given patient's age, and markers of infection will admit for further antibiotics for pnuemonia and possible colitis.   Amount and/or Complexity of Data Reviewed Labs: ordered. Radiology: ordered. Decision-making details documented in ED Course. ECG/medicine tests: ordered.  Risk Prescription drug management. Decision regarding hospitalization.          Final Clinical Impression(s) / ED Diagnoses Final diagnoses:  None    Rx / DC Orders ED Discharge Orders     None         Coral Spikes, DO 04/30/23 0010

## 2023-04-30 NOTE — ED Provider Notes (Incomplete)
[place] EMERGENCY DEPARTMENT AT [facility] Provider Note   CSN: [id number] Arrival date & time: 04/29/23  1742     History {Add pertinent medical, surgical, social history, OB history to HPI:1} Chief Complaint  Patient presents with  . Shortness of Breath    [name] is a 72 y.o. female.  72 year old female present emergency department with abdominal pain nausea vomiting and shortness of breath.  She is trach dependent.  Family notes that she has not been feeling well for the past several days.  She had several episodes of vomiting.  She wears 4 L of oxygen at home and BiPAP at night.  EMS reports copious secretions.   Shortness of Breath      Home Medications Prior to Admission medications   Medication Sig Start Date End Date Taking? Authorizing Provider  atorvastatin (LIPITOR) 40 MG tablet Take 1 tablet (40 mg total) by mouth daily. 03/29/23   Del Nigel Berthold, FNP  busPIRone (BUSPAR) 10 MG tablet Take 1 tablet (10 mg total) by mouth 2 (two) times daily. 04/07/23   Del Nigel Berthold, FNP  Cholecalciferol (VITAMIN D3) 50 MCG (2000 UT) capsule Take 1 capsule (2,000 Units total) by mouth daily. 01/09/23   Del Nigel Berthold, FNP  Continuous Glucose Sensor (FREESTYLE LIBRE 2 SENSOR) MISC FOR CONTINUOUS GLUCOSE MONITORING. 03/03/23   Billie Lade, MD  Continuous Glucose Sensor (FREESTYLE LIBRE 2 SENSOR) MISC Use daily 03/02/23   Del Newman Nip, Tenna Child, FNP  cyanocobalamin (VITAMIN B12) 500 MCG tablet Take 1 tablet (500 mcg total) by mouth daily. Patient taking differently: Take 500 mcg by mouth every other day. 12/28/22   Mauro Kaufmann, NP  dicyclomine (BENTYL) 10 MG capsule TAKE 1 CAPSULE BY MOUTH UP TO FOUR TIMES DAILY BEFORE MEALS AND AT BEDTIME (STOP IF CONSTIPATION) 03/24/23   Letta Median, PA-C  donepezil (ARICEPT) 5 MG tablet TAKE 1 TABLET BY MOUTH AT BEDTIME FOR dementia 02/21/23   Del Orbe Polanco, Tenna Child, FNP  ELIQUIS 5 MG  TABS tablet TAKE 1 TABLET BY MOUTH TWICE DAILY AS A BLOOD THINNER 02/21/23   Del Newman Nip, Tenna Child, FNP  empagliflozin (JARDIANCE) 10 MG TABS tablet Take 1 tablet (10 mg total) by mouth daily. 01/09/23   Del Nigel Berthold, FNP  furosemide (LASIX) 40 MG tablet TAKE 1 TABLET BY MOUTH DAILY FOR FLUID 02/21/23   Del Newman Nip, [place], FNP  gabapentin (NEURONTIN) 100 MG capsule TAKE ONE CAPSULE BY MOUTH THREE TIMES DAILY FOR NEUROPATHIC PAIN 02/21/23   Del Newman Nip, Tenna Child, FNP  insulin glargine (LANTUS) 100 UNIT/ML injection Inject 0.2 mLs (20 Units total) into the skin at bedtime. 04/05/23   Roma Kayser, MD  ipratropium-albuterol (DUONEB) 0.5-2.5 (3) MG/3ML SOLN Take 3 mLs by nebulization every 4 (four) hours as needed (Asthma). 11/08/21   [provider]  levETIRAcetam (KEPPRA) 500 MG tablet TAKE 1 TABLET BY MOUTH TWICE DAILY FOR SEIZURE PREVENTION 03/02/23   Del Nigel Berthold, FNP  levothyroxine (SYNTHROID) 175 MCG tablet Take 1 tablet (175 mcg total) by mouth daily. 04/02/23   Del Nigel Berthold, FNP  loperamide (IMODIUM) 2 MG capsule Take by mouth. 07/09/21   [provider]  LORazepam (ATIVAN) 1 MG tablet TAKE 1/2 TO 1 TABLET BY MOUTH AT BEDTIME FOR SLEEP 03/02/23   Del Newman Nip, Tenna Child, FNP  melatonin 3 MG TABS tablet TAKE 1 TABLET BY MOUTH AT BEDTIME AS NEED FOR SLEEP 03/02/23  Del Newman Nip, Tenna Child, FNP  metoprolol tartrate (LOPRESSOR) 25 MG tablet TAKE 1 TABLET BY MOUTH TWICE DAILY FOR HIGH BLOOD PRESSURE OR HEART RATE CONTROL 03/02/23   Del Nigel Berthold, FNP  Multiple Vitamins-Minerals (MULTIVITAMIN WITH MINERALS) tablet Take 1 tablet by mouth daily. For Diabetics    [provider]  mupirocin ointment (BACTROBAN) 2 % Apply 1 Application topically 2 (two) times daily. 03/29/23   Del Nigel Berthold, FNP  nitroGLYCERIN (NITROSTAT) 0.4 MG SL tablet  03/08/22   [provider]  pantoprazole (PROTONIX) 40 MG tablet TAKE  1 TABLET BY MOUTH TWICE DAILY FOR acid reflux 03/02/23   Del Newman Nip, Tenna Child, FNP  potassium chloride (KLOR-CON) 10 MEQ tablet Take 1 tablet (10 mEq total) by mouth once for 1 dose. 03/24/23 03/24/23  Del Nigel Berthold, FNP  sertraline (ZOLOFT) 25 MG tablet TAKE 1 TABLET BY MOUTH DAILY FOR depression 03/02/23   Del Newman Nip, Tenna Child, FNP  traMADol (ULTRAM) 50 MG tablet Take 1 tablet (50 mg total) by mouth 3 (three) times daily as needed for moderate pain or severe pain. 03/31/23   Del Nigel Berthold, FNP  trolamine salicylate (ASPERCREME) 10 % cream Apply 1 application. topically as needed (knee pain).    [provider]      Allergies    Codeine, Dilaudid [hydromorphone hcl], Reglan [metoclopramide], and Hyoscyamine    Review of Systems   Review of Systems  Respiratory:  Positive for shortness of breath.     Physical Exam Updated Vital Signs BP (!) 118/49   Pulse (!) 103   Temp 100.2 F (37.9 C) (Oral)   Resp 13   Ht 5\' 6"  (1.676 m)   Wt 81.2 kg   SpO2 96%   BMI 28.89 kg/m  Physical Exam Vitals and nursing note reviewed.  Constitutional:      General: She is not in acute distress.    Appearance: She is not toxic-appearing.  HENT:     Head: Normocephalic.  Cardiovascular:     Rate and Rhythm: Tachycardia present. Rhythm irregular.  Pulmonary:     Effort: Tachypnea present.     Comments: Trach collar in place.  Blow-by oxygen with oxygen saturations in the mid 90s.  Has coarse breath sounds throughout all lung fields.  No wheezing appreciated. Musculoskeletal:     Cervical back: Normal range of motion.  Neurological:     Mental Status: She is alert.     ED Results / Procedures / Treatments   Labs (all labs ordered are listed, but only abnormal results are displayed) Labs Reviewed  CBC - Abnormal; Notable for the following components:      Result Value   WBC 14.2 (*)    All other components within normal limits  COMPREHENSIVE METABOLIC  PANEL - Abnormal; Notable for the following components:   CO2 20 (*)    Glucose, Bld 171 (*)    BUN 35 (*)    Creatinine, Ser 1.20 (*)    GFR, Estimated 48 (*)    All other components within normal limits  TROPONIN I (HIGH SENSITIVITY) - Abnormal; Notable for the following components:   Troponin I (High Sensitivity) 38 (*)    All other components within normal limits  TROPONIN I (HIGH SENSITIVITY) - Abnormal; Notable for the following components:   Troponin I (High Sensitivity) 35 (*)    All other components within normal limits  RESP PANEL BY RT-PCR (RSV, FLU A&B, COVID)  RVPGX2  CULTURE, BLOOD (ROUTINE X 2)  CULTURE, BLOOD (ROUTINE X 2)  MRSA NEXT GEN BY PCR, NASAL  LIPASE, BLOOD  LACTIC ACID, PLASMA    EKG None  Radiology CT ABDOMEN PELVIS W CONTRAST  Result Date: 04/29/2023 CLINICAL DATA:  Sepsis EXAM: CT ABDOMEN AND PELVIS WITH CONTRAST TECHNIQUE: Multidetector CT imaging of the abdomen and pelvis was performed using the standard protocol following bolus administration of intravenous contrast. RADIATION DOSE REDUCTION: This exam was performed according to the departmental dose-optimization program which includes automated exposure control, adjustment of the mA and/or kV according to patient size and/or use of iterative reconstruction technique. CONTRAST:  75mL OMNIPAQUE IOHEXOL 300 MG/ML  SOLN COMPARISON:  01/06/2021 FINDINGS: Lower chest: Cardiomegaly. Heterogeneous airspace opacity and/or scarring of the right-greater-than-left lung bases (series 5, image 30). Small hiatal hernia. Hepatobiliary: No focal liver abnormality is seen. Status post cholecystectomy. No biliary dilatation. Pancreas: Unremarkable. No pancreatic ductal dilatation or surrounding inflammatory changes. Spleen: Normal in size without significant abnormality. Adrenals/Urinary Tract: Adrenal glands are unremarkable. Kidneys are normal, without renal calculi, solid lesion, or hydronephrosis. Bladder is  unremarkable. Stomach/Bowel: Stomach is within normal limits. Long segment colonic wall thickening involving the ascending and proximal transverse colon (series 3, image 27), as well as a separate segment of the distal sigmoid colon and rectum (series 2, image 66). Occasional sigmoid diverticula, which do not appear to be the nidus of inflammation. Vascular/Lymphatic: Aortic atherosclerosis. No enlarged abdominal or pelvic lymph nodes. Reproductive: Status post hysterectomy. Other: Small fat containing midline ventral hernia (series 2, image 60). No ascites. Musculoskeletal: No acute or significant osseous findings. Superior endplate wedge deformity of T12 status post vertebral cement augmentation (series 4, image 67). IMPRESSION: 1. Long segment colonic wall thickening involving the ascending and proximal transverse colon, as well as a separate segment of the distal sigmoid colon and rectum. Findings are consistent with nonspecific infectious, inflammatory, or ischemic colitis. 2. Heterogeneous airspace opacity and/or scarring of the right-greater-than-left lung bases, concerning for infection or aspiration perhaps acute on chronic. 3. Cardiomegaly. Aortic Atherosclerosis (ICD10-I70.0). Electronically Signed   By: Jearld Lesch M.D.   On: 04/29/2023 21:11   DG Chest Portable 1 View  Result Date: 04/29/2023 CLINICAL DATA:  Cough and shortness of breath EXAM: PORTABLE CHEST 1 VIEW COMPARISON:  09/24/2021 FINDINGS: Stable cardiomegaly. Mitral annular and aortic atherosclerotic calcification. Tracheostomy tip in the intrathoracic trachea. Diffuse interstitial coarsening similar to prior. Scarring or atelectasis in the left mid lung. No pleural effusion or pneumothorax. IMPRESSION: Cardiomegaly and interstitial edema similar to prior. Electronically Signed   By: Minerva Fester M.D.   On: 04/29/2023 19:40    Procedures Procedures  {Document cardiac monitor, telemetry assessment procedure when  appropriate:1}  Medications Ordered in ED Medications  vancomycin (VANCOREADY) IVPB 1500 mg/300 mL (1,500 mg Intravenous New Bag/Given 04/29/23 2335)  vancomycin (VANCOCIN) IVPB 1000 mg/200 mL premix (has no administration in time range)  traMADol (ULTRAM) tablet 50 mg (has no administration in time range)  iohexol (OMNIPAQUE) 300 MG/ML solution 75 mL (75 mLs Intravenous Contrast Given 04/29/23 2017)  ceFEPIme (MAXIPIME) 1 g in sodium chloride 0.9 % 100 mL IVPB (0 g Intravenous Stopped 04/29/23 2236)    ED Course/ Medical Decision Making/ A&P Clinical Course as of 04/30/23 0002  Sat Apr 29, 2023  1824 ENT note 03/24/23: "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 She was trached in July 2022 for pneumonia. She is a CO2 retainer with sleep apnea and has kept the trach for that reason. 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 Unfortunately the procedure and the subsequent vocal fold edema continued to cause an obstruction or restriction that does not allow her to use her Passy-Muir valve." [TY]  1947 DG Chest Portable 1 View IMPRESSION: Cardiomegaly and interstitial edema similar to prior.   [TY]  2120 CT ABDOMEN PELVIS W CONTRAST IMPRESSION: 1. Long segment colonic wall thickening involving the ascending and proximal transverse colon, as well as a separate segment of the distal sigmoid colon and rectum. Findings are consistent with nonspecific infectious, inflammatory, or ischemic colitis. 2. Heterogeneous airspace opacity and/or scarring of the right-greater-than-left lung bases, concerning for infection or aspiration perhaps acute on chronic. 3. Cardiomegaly.   [TY]    Clinical Course User Index [TY] Coral Spikes, DO   {   Click here for ABCD2, HEART and other calculatorsREFRESH Note before signing :1}                               Medical Decision Making Amount and/or Complexity of Data Reviewed Labs: ordered. Radiology: ordered. Decision-making details documented in ED Course. ECG/medicine tests: ordered.  Risk Prescription drug management. Decision regarding hospitalization.   ***  {Document critical care time when appropriate:1} {Document review of labs and clinical decision tools ie heart score, Chads2Vasc2 etc:1}  {Document your independent review of radiology images, and any outside records:1} {Document your discussion with family members, caretakers, and with consultants:1} {Document social determinants of health affecting pt's care:1} {Document your decision making why or why not admission, treatments were needed:1} Final Clinical Impression(s) / ED Diagnoses Final diagnoses:  None    Rx / DC Orders ED Discharge Orders     None

## 2023-04-30 NOTE — Assessment & Plan Note (Signed)
Continue Zoloft 

## 2023-04-30 NOTE — Progress Notes (Signed)
Positive blood culture in aerobic bottle of gram positive Cocci. Notified Dr. Jarvis Newcomer but at time of note no one is listed as attending provider.

## 2023-04-30 NOTE — H&P (Signed)
History and Physical    Patient: [name]:[id number] DOB: 02-Aug-1950 DOA: 04/29/2023 DOS: the patient was seen and examined on 04/30/2023 PCP: Rica Records, FNP  Patient coming from: Home  Chief Complaint:  Chief Complaint  Patient presents with   Shortness of Breath   HPI: [name] is a 72 y.o. female with medical history significant of anxiety, GERD, chronic combined systolic and diastolic CHF, diabetes mellitus type 2, hyperlipidemia, hypothyroid, seizures on Keppra, chronic atrial fibrillation, and more presents the ED with a chief complaint of nausea and vomiting.  Patient reports she had 1 episode of nausea and vomiting.  Daughter at bedside reports it was projectile.  There was no coffee-ground emesis or hematemesis.  She had pain in her abdomen.  Daughter reports that this is the first time that she has had a episode of emesis since having the trach so they were concerned about the position of the trach and called the home nurse.  When I discussed the vomiting and the fact the patient had paresthesias in her legs at home nurse advised him to come into the ED.  The paresthesias were worse than normal but patient does have a history of neuropathy.  She has no missed doses of gabapentin.  She had woken up with her legs being bilaterally numb and tingly.  She reports she stayed sedentary most of the day because of this.  Daughter reports that patient may have been more short of breath and she was trying to put her on a vent during the day for some extra energy.  The vomiting happened after this.  With a happening directly after suction there may have been a gag reflex involved with her vomiting.  Patient has not had any diarrhea.  She does have a sensitive stomach per daughter.  They have an appointment with gastro on Wednesday.  Right now pain is resolved.  Patient does not feel nauseous.  She does report that she felt feverish at home but there was no measured  temp.  Patient does not smoke, does not drink, she is full code. Review of Systems: As mentioned in the history of present illness. All other systems reviewed and are negative. Past Medical History:  Diagnosis Date   Abnormal pulmonary function test    Anemia    H/H of 10/30 with a normal MCV in 12/09   Anxiety    Arthritis    Barrett's esophagus    Diagnosed 1995. Last EGD 2016-NO BARRETT'S.    Chest pain    a. 2022 Cath: nl cors; b. 2008 Neg myoview; c. 01/2014 Cath: Nl cors. EF 40%; c. 08/2020 NSTEMI/Cath [facility]): nl cors.   Chronic anticoagulation    Chronic combined systolic and diastolic CHF (congestive heart failure) (HCC)    a. 10/2014 Echo: EF40%; b. 01/2015 Echo: EF 55-60%; c. 11/2020 Echo: EF 55-60%, mod LVH, sev BAE, mild MR, mod TR. PASP ; d. 01/2021 Echo: EF 45-50%, glob HK, mod conc LVH, mod reduced RV fxn, RVSP 60.31mmHg, Mod TR, mild to mod MR, sev dil LA.   Chronic LBP    Surgical intervention in 1996   Diabetes mellitus, type 2 (HCC)    Insulin therapy; exacerbated by prednisone   Gastroparesis    99% retention 05/2008 on GES   GERD (gastroesophageal reflux disease)    Heart attack (HCC) 08/13/2020   Hiatal hernia    Hyperlipidemia    Hypertension    Hypothyroid    IBS (irritable  bowel syndrome)    Obesity    OSA on CPAP    had CPAP and cannot tolerate.   Paroxysmal atrial fibrillation (HCC)    a.CHA2DS2VASc = 6-->eliquis. Also on Amio.   Pulmonary hypertension (HCC) 01/2015   a. 01/2015 RHC: Predominantly pulmonary venous hypertension but may be component of PAH (PA mean 52, PCWP 31); b. 01/2021 Echo: RVSP 60.71mmHg w/ mod reduced RV fxn.   Seizures (HCC)    last seizure was 2 years ago; on keppra for this; unknown etiology   Syncope    a. Admitted 05/2009; magnetic resonance imagin/ MRA - negative; etiology thought to be orthostasis secondary to drugs and dehydration. b. Syncope 02/2015 also felt 2/2 dehydration.   Past Surgical History:  Procedure Laterality  Date   BACK SURGERY  1996   BIOPSY N/A 11/08/2013   Procedure: BIOPSY  / Tissue sampling / ulcers present in small intestine;  Surgeon: West Bali, MD;  Location: [facility];  Service: Endoscopy;  Laterality: N/A;   CARDIAC CATHETERIZATION  2002   CARDIAC CATHETERIZATION N/A 01/26/2015   Procedure: Right Heart Cath;  Surgeon: Laurey Morale, MD;  Location: [facility] INVASIVE CV LAB;  Service: Cardiovascular;  Laterality: N/A;   CARDIOVASCULAR STRESS TEST  2008   Stress nuclear study   CARDIOVERSION N/A 03/06/2015   Procedure: CARDIOVERSION;  Surgeon: Laurey Morale, MD;  Location: [facility] ENDOSCOPY;  Service: Cardiovascular;  Laterality: N/A;   CARPAL TUNNEL RELEASE  1994   CATARACT EXTRACTION W/PHACO Right 01/10/2022   Procedure: CATARACT EXTRACTION PHACO AND INTRAOCULAR LENS PLACEMENT (IOC);  Surgeon: [name], MD;  Location: [facility] ORS;  Service: Ophthalmology;  Laterality: Right;  CDE: 21.51   CATARACT EXTRACTION W/PHACO Left 01/24/2022   Procedure: CATARACT EXTRACTION PHACO AND INTRAOCULAR LENS PLACEMENT (IOC);  Surgeon: [name], MD;  Location: [facility] ORS;  Service: Ophthalmology;  Laterality: Left;  CDE: 17.13   COLONOSCOPY  11/2011   Dr. Darrick Penna: Internal hemorrhoids, mild diverticulosis. Random colon biopsies negative.   COLONOSCOPY N/A 11/08/2013   SLF: Normal mucosa in the terminal ileum/The colon IS redundant/  Moderate diverticulosis throughout the entire colon. ileum bx benign. colon bx benign   CRYOTHERAPY  01/23/2021   Procedure: CRYOTHERAPY;  Surgeon: Lorin Glass, MD;  Location: [facility] ENDOSCOPY;  Service: Pulmonary;;   CRYOTHERAPY  01/24/2021   Procedure: Cloyde Reams;  Surgeon: Lorin Glass, MD;  Location: [facility] ENDOSCOPY;  Service: Pulmonary;;   CRYOTHERAPY  01/26/2021   Procedure: Cloyde Reams;  Surgeon: Lupita Leash, MD;  Location: [facility] ENDOSCOPY;  Service: Cardiopulmonary;;   ESOPHAGOGASTRODUODENOSCOPY  2008   Barrett's without dysplasia. esphagus dilated. antral  erosions, h.pylori serologies negative.   ESOPHAGOGASTRODUODENOSCOPY  11/2011   Dr. Darrick Penna: Barrett's esophagus, mild gastritis, diverticulum in the second portion of the duodenum repeat EGD 3 years. Small bowel biopsies negative. Gastric biopsy show reactive gastropathy but no H. pylori. Esophageal biopsies consistent with GERD. Next EGD 11/2014   ESOPHAGOGASTRODUODENOSCOPY N/A 11/21/2014   WUJ:WJXB non-erosive gastritis/irregular z-line   ESOPHAGOGASTRODUODENOSCOPY  03/2022   FOREIGN BODY REMOVAL  01/23/2021   Procedure: FOREIGN BODY REMOVAL;  Surgeon: Lorin Glass, MD;  Location: [facility] ENDOSCOPY;  Service: Pulmonary;;   FOREIGN BODY REMOVAL  01/24/2021   Procedure: FOREIGN BODY REMOVAL;  Surgeon: Lorin Glass, MD;  Location: [facility] ENDOSCOPY;  Service: Pulmonary;;   GIVENS CAPSULE STUDY  12/07/2011   Proximal small bowel, rare AVM. Distal small bowel, multiple ulcers noted   GIVENS CAPSULE STUDY N/A 09/27/2013  Distal small bowel ulcers extending to TI.   GIVENS CAPSULE STUDY N/A 10/10/2013   Procedure: GIVENS CAPSULE STUDY;  Surgeon: West Bali, MD;  Location: [facility];  Service: Endoscopy;  Laterality: N/A;  7:30   HEMOSTASIS CONTROL  01/24/2021   Procedure: HEMOSTASIS CONTROL;  Surgeon: Lorin Glass, MD;  Location: [facility] ENDOSCOPY;  Service: Pulmonary;;   HEMOSTASIS CONTROL  01/26/2021   Procedure: HEMOSTASIS CONTROL;  Surgeon: Lupita Leash, MD;  Location: [facility] ENDOSCOPY;  Service: Cardiopulmonary;;   IR GASTROSTOMY TUBE MOD SED  01/11/2021   IR GASTROSTOMY TUBE REMOVAL  04/13/2021   IR KYPHO THORACIC WITH BONE BIOPSY  02/09/2018   KNEE ARTHROSCOPY WITH MEDIAL MENISECTOMY Right 06/09/2016   Procedure: KNEE ARTHROSCOPY WITH MEDIAL MENISECTOMY;  Surgeon: Vickki Hearing, MD;  Location: [facility] ORS;  Service: Orthopedics;  Laterality: Right;  medial and lateral menisectomy   LAMINECTOMY  1995   L4-L5   LAPAROSCOPIC CHOLECYSTECTOMY  1990s   LEFT HEART CATHETERIZATION WITH  CORONARY ANGIOGRAM  01/10/2014   Procedure: LEFT HEART CATHETERIZATION WITH CORONARY ANGIOGRAM;  Surgeon: Lesleigh Noe, MD;  Location: [facility] CATH LAB;  Service: Cardiovascular;;   MICROLARYNGOSCOPY WITH LASER AND BALLOON DILATION N/A 11/17/2021   Procedure: MICROLARYNGOSCOPY WITH CO2 LASER, SUBGLOTTIC LARYNGEAL MASS BIOSPY;  Surgeon: Serena Colonel, MD;  Location: [facility] OR;  Service: ENT;  Laterality: N/A;   RIGHT HEART CATHETERIZATION N/A 01/10/2014   Procedure: RIGHT HEART CATH;  Surgeon: Lesleigh Noe, MD;  Location: [facility] CATH LAB;  Service: Cardiovascular;  Laterality: N/A;   TOTAL ABDOMINAL HYSTERECTOMY  1999   TRACHEOSTOMY REVISION N/A 07/18/2021   Procedure: TRACHEOSTOMY REVISION;  Surgeon: Suzanna Obey, MD;  Location: [facility] OR;  Service: ENT;  Laterality: N/A;   TRACHEOSTOMY TUBE PLACEMENT N/A 09/01/2017   Procedure: TRACHEOSTOMY;  Surgeon: Drema Halon, MD;  Location: [facility] OR;  Service: ENT;  Laterality: N/A;   VIDEO BRONCHOSCOPY Bilateral 01/23/2021   Procedure: VIDEO BRONCHOSCOPY WITHOUT FLUORO;  Surgeon: Lorin Glass, MD;  Location: [facility] ENDOSCOPY;  Service: Pulmonary;  Laterality: Bilateral;   VIDEO BRONCHOSCOPY Bilateral 01/24/2021   Procedure: VIDEO BRONCHOSCOPY WITHOUT FLUORO;  Surgeon: Lorin Glass, MD;  Location: [facility] ENDOSCOPY;  Service: Pulmonary;  Laterality: Bilateral;   VIDEO BRONCHOSCOPY N/A 01/26/2021   Procedure: VIDEO BRONCHOSCOPY WITHOUT FLUORO;  Surgeon: Lupita Leash, MD;  Location: [facility] ENDOSCOPY;  Service: Cardiopulmonary;  Laterality: N/A;   Social History:  reports that she quit smoking about 29 years ago. Her smoking use included cigarettes. She started smoking about 57 years ago. She has a 6.8 pack-year smoking history. She has never used smokeless tobacco. She reports that she does not currently use alcohol. She reports that she does not use drugs.  Allergies  Allergen Reactions   Codeine Nausea And Vomiting and Other (See Comments)     HALLUCINATIONS/  Tolerates Tramadol      Dilaudid [Hydromorphone Hcl] Other (See Comments)    Made her pass out/ Tolerates Tramadol     Reglan [Metoclopramide] Other (See Comments)    DYSKINESIA    Hyoscyamine Other (See Comments)    MADE DIARRHEA WORSE    Family History  Problem Relation Age of Onset   Hypertension Mother    Alzheimer's disease Mother    Stroke Mother    Heart attack Mother    Breast cancer Sister    Hypertension Other    Heart disease Neg Hx    Colon cancer Neg Hx  Liver disease Neg Hx     Prior to Admission medications   Medication Sig Start Date End Date Taking? Authorizing Provider  atorvastatin (LIPITOR) 40 MG tablet Take 1 tablet (40 mg total) by mouth daily. 03/29/23   Del Nigel Berthold, FNP  busPIRone (BUSPAR) 10 MG tablet Take 1 tablet (10 mg total) by mouth 2 (two) times daily. 04/07/23   Del Nigel Berthold, FNP  Cholecalciferol (VITAMIN D3) 50 MCG (2000 UT) capsule Take 1 capsule (2,000 Units total) by mouth daily. 01/09/23   Del Nigel Berthold, FNP  Continuous Glucose Sensor (FREESTYLE LIBRE 2 SENSOR) MISC FOR CONTINUOUS GLUCOSE MONITORING. 03/03/23   Billie Lade, MD  Continuous Glucose Sensor (FREESTYLE LIBRE 2 SENSOR) MISC Use daily 03/02/23   Del Newman Nip, Tenna Child, FNP  cyanocobalamin (VITAMIN B12) 500 MCG tablet Take 1 tablet (500 mcg total) by mouth daily. Patient taking differently: Take 500 mcg by mouth every other day. 12/28/22   Mauro Kaufmann, NP  dicyclomine (BENTYL) 10 MG capsule TAKE 1 CAPSULE BY MOUTH UP TO FOUR TIMES DAILY BEFORE MEALS AND AT BEDTIME (STOP IF CONSTIPATION) 03/24/23   Letta Median, PA-C  donepezil (ARICEPT) 5 MG tablet TAKE 1 TABLET BY MOUTH AT BEDTIME FOR dementia 02/21/23   Del Orbe Polanco, Tenna Child, FNP  ELIQUIS 5 MG TABS tablet TAKE 1 TABLET BY MOUTH TWICE DAILY AS A BLOOD THINNER 02/21/23   Del Newman Nip, Tenna Child, FNP  empagliflozin (JARDIANCE) 10 MG TABS tablet Take 1 tablet (10 mg  total) by mouth daily. 01/09/23   Del Nigel Berthold, FNP  furosemide (LASIX) 40 MG tablet TAKE 1 TABLET BY MOUTH DAILY FOR FLUID 02/21/23   Del Newman Nip, [place], FNP  gabapentin (NEURONTIN) 100 MG capsule TAKE ONE CAPSULE BY MOUTH THREE TIMES DAILY FOR NEUROPATHIC PAIN 02/21/23   Del Newman Nip, Tenna Child, FNP  insulin glargine (LANTUS) 100 UNIT/ML injection Inject 0.2 mLs (20 Units total) into the skin at bedtime. 04/05/23   Roma Kayser, MD  ipratropium-albuterol (DUONEB) 0.5-2.5 (3) MG/3ML SOLN Take 3 mLs by nebulization every 4 (four) hours as needed (Asthma). 11/08/21   [provider]  levETIRAcetam (KEPPRA) 500 MG tablet TAKE 1 TABLET BY MOUTH TWICE DAILY FOR SEIZURE PREVENTION 03/02/23   Del Nigel Berthold, FNP  levothyroxine (SYNTHROID) 175 MCG tablet Take 1 tablet (175 mcg total) by mouth daily. 04/02/23   Del Nigel Berthold, FNP  loperamide (IMODIUM) 2 MG capsule Take by mouth. 07/09/21   [provider]  LORazepam (ATIVAN) 1 MG tablet TAKE 1/2 TO 1 TABLET BY MOUTH AT BEDTIME FOR SLEEP 03/02/23   Del Newman Nip, Tenna Child, FNP  melatonin 3 MG TABS tablet TAKE 1 TABLET BY MOUTH AT BEDTIME AS NEED FOR SLEEP 03/02/23   Del Nigel Berthold, FNP  metoprolol tartrate (LOPRESSOR) 25 MG tablet TAKE 1 TABLET BY MOUTH TWICE DAILY FOR HIGH BLOOD PRESSURE OR HEART RATE CONTROL 03/02/23   Del Nigel Berthold, FNP  Multiple Vitamins-Minerals (MULTIVITAMIN WITH MINERALS) tablet Take 1 tablet by mouth daily. For Diabetics    [provider]  mupirocin ointment (BACTROBAN) 2 % Apply 1 Application topically 2 (two) times daily. 03/29/23   Del Nigel Berthold, FNP  nitroGLYCERIN (NITROSTAT) 0.4 MG SL tablet  03/08/22   [provider]  pantoprazole (PROTONIX) 40 MG tablet TAKE 1 TABLET BY MOUTH TWICE DAILY FOR acid reflux 03/02/23   Del Newman Nip, Tenna Child, FNP  potassium chloride (KLOR-CON) 10  MEQ tablet Take 1 tablet (10 mEq total) by mouth  once for 1 dose. 03/24/23 03/24/23  Del Nigel Berthold, FNP  sertraline (ZOLOFT) 25 MG tablet TAKE 1 TABLET BY MOUTH DAILY FOR depression 03/02/23   Del Newman Nip, Tenna Child, FNP  traMADol (ULTRAM) 50 MG tablet Take 1 tablet (50 mg total) by mouth 3 (three) times daily as needed for moderate pain or severe pain. 03/31/23   Del Nigel Berthold, FNP  trolamine salicylate (ASPERCREME) 10 % cream Apply 1 application. topically as needed (knee pain).    [provider]    Physical Exam: Vitals:   04/30/23 0130 04/30/23 0245 04/30/23 0345 04/30/23 0430  BP: (!) 111/53 (!) 101/53 (!) 106/39 93/79  Pulse: (!) 108 90 91 75  Resp: 19 20 18  (!) 23  Temp:      TempSrc:      SpO2: 98% 96% 97% 99%  Weight:      Height: 5\' 6"  (1.676 m)      1.  General: Patient lying supine in bed,  no acute distress   2. Psychiatric: Alert and oriented x 3, mood and behavior normal for situation, pleasant and cooperative with exam   3. Neurologic: Speech and language are normal, face is symmetric, moves all 4 extremities voluntarily, at baseline without acute deficits on limited exam   4. HEENMT:  Head is atraumatic, normocephalic, pupils reactive to light, neck is supple, trachea is midline, mucous membranes are moist   5. Respiratory : Lungs are clear to auscultation bilaterally without wheezing, rhonchi, rales, no cyanosis, no increase in work of breathing or accessory muscle use   6. Cardiovascular : Heart rate normal, rhythm is regular, no murmurs, rubs or gallops, no peripheral edema, peripheral pulses palpated   7. Gastrointestinal:  Abdomen is soft, nondistended, nontender to palpation bowel sounds active, no masses or organomegaly palpated   8. Skin:  Skin is warm, dry and intact without rashes, acute lesions, or ulcers on limited exam   9.Musculoskeletal:  No acute deformities or trauma, no asymmetry in tone, no peripheral edema, peripheral pulses palpated, no tenderness to  palpation in the extremities  Data Reviewed: In the ED Temp 100.2, heart rate 83-104, respiratory rate 15-20, blood pressure 99/62-123/92, satting normally on 30% FiO2 trach collar Leukocytosis 14.2, hemoglobin 13.5 Chemistries unremarkable aside from a hyperglycemia 171 Trope downtrending 38, 35 Negative COVID Blood culture pending CT shows possible colitis and also possible pneumonia X-ray of her chest shows cardiomegaly with interstitial edema similar to prior Admission requested for SIRS criteria possibly got possibly lung related  Patient was started on vancomycin and cefepime Procalcitonin is unrevealing  Assessment and Plan: * SIRS (systemic inflammatory response syndrome) (HCC) - Patient is tachycardic at 104, has a leukocytosis of 14.2 - Endorgan damage with a slightly elevated troponin that is downtrending from 38-35 - CT shows opacities in the lung with right greater than left possibly aspiration possibly chronic, and changes in the colon which could be infectious, inflammatory, or ischemic colitis.  Patient is pain-free at this time so ischemic colitis is very low on the differential - With patient being SIRS positive we will continue to treat with broad-spectrum antibiotics - N.p.o. for bowel rest - Zofran for nausea control - COVID is negative - Blood cultures pending - No complaints of urinary symptoms - Continue to monitor  Tracheostomy care [facility]) - Consult respiratory for tracheostomy care  Anxiety - Continue BuSpar  Depression - Continue Zoloft  Type 2  diabetes mellitus (HCC) - Continue reduced dose of basal insulin - Sliding scale coverage - Monitor CBG  Atrial fibrillation (HCC) - Continue metoprolol and Eliquis - Currently rate controlled  Essential hypertension, benign - Continue metoprolol  Hyperlipidemia - Continue statin  Hypothyroidism - Continue Synthroid - Check TSH      Advance Care Planning:   Code Status: Full  Code  Consults: None at this time  Family Communication: Daughter at bedside  Severity of Illness: The appropriate patient status for this patient is OBSERVATION. Observation status is judged to be reasonable and necessary in order to provide the required intensity of service to ensure the patient's safety. The patient's presenting symptoms, physical exam findings, and initial radiographic and laboratory data in the context of their medical condition is felt to place them at decreased risk for further clinical deterioration. Furthermore, it is anticipated that the patient will be medically stable for discharge from the hospital within 2 midnights of admission.   Author: Lilyan Gilford, DO 04/30/2023 5:35 AM  For on call review [email or web address].

## 2023-04-30 NOTE — Assessment & Plan Note (Signed)
-   Consult respiratory for tracheostomy care

## 2023-04-30 NOTE — ED Notes (Signed)
Incontinent care provided and linens changed.

## 2023-04-30 NOTE — Assessment & Plan Note (Signed)
-   Continue metoprolol and Eliquis - Currently rate controlled

## 2023-04-30 NOTE — ED Notes (Signed)
Pt suctioned x2. Now linens being changed.

## 2023-04-30 NOTE — ED Notes (Signed)
Removed patient from ventilator this morning. Placed on 30% ATC. Suctioned patient a couple times. Cuff is deflated. No issues. Daughter at bedside.

## 2023-04-30 NOTE — Consult Note (Signed)
Pharmacy Antibiotic Note  [name] is a 72 y.o. female with medical history including anxiety, GERD, combined systolic and diastolic CHF, DM, HLD, hypothyroidism, seizures on Keppra, Afib admitted on 04/29/2023 with  SIRS .  Pharmacy has been consulted for vancomycin dosing.  Creatinine today is 1.22, stable and appears c/w baseline  Plan:  Vancomycin 1.5 g (~18 mg/kg) IV LD given 10/26. Will start maintenance regimen of vancomycin 1 g IV q24h --Calculated AUC: 477, Cmin 13 --Scr 1.22, IBW, Vd 0.72 --Levels at steady state or as clinically indicated  Height: 5\' 6"  (167.6 cm) Weight: 81.2 kg (179 lb 0.2 oz) IBW/kg (Calculated) : 59.3  Temp (24hrs), Avg:99.5 F (37.5 C), Min:98.2 F (36.8 C), Max:100.3 F (37.9 C)  Recent Labs  Lab 04/29/23 1843 04/30/23 0518  WBC 14.2* 17.4*  CREATININE 1.20* 1.22*  LATICACIDVEN 1.1  --     Estimated Creatinine Clearance: 43.5 mL/min (A) (by C-G formula based on SCr of 1.22 mg/dL (H)).    Allergies  Allergen Reactions   Codeine Nausea And Vomiting and Other (See Comments)    Hallucinations   Dilaudid [Hydromorphone Hcl] Other (See Comments)    Syncope    Reglan [Metoclopramide] Other (See Comments)    Dyskinesia    Levsin [Hyoscyamine] Diarrhea    Caused worse diarrhea    Antimicrobials this admission: Cefepime 10/26 x 1 Vancomycin 10/26 x 1, 10/27 >>  Ceftriaxone 10/27 >>  Metronidazole 10/27 >>   Dose adjustments this admission: N/A  Microbiology results: 10/26 BCx: 1/4 bottles GPC, pending identification 10/27 MRSA PCR: pending 10/27 GIP: pending 10/27 C diff: pending  Thank you for allowing pharmacy to be a part of this patient's care.  Tressie Ellis 04/30/2023 5:06 PM

## 2023-04-30 NOTE — Assessment & Plan Note (Signed)
Continue Synthroid.  Check TSH. 

## 2023-05-01 ENCOUNTER — Ambulatory Visit: Payer: PPO | Admitting: Orthopedic Surgery

## 2023-05-01 DIAGNOSIS — R6510 Systemic inflammatory response syndrome (SIRS) of non-infectious origin without acute organ dysfunction: Secondary | ICD-10-CM | POA: Diagnosis not present

## 2023-05-01 LAB — CBC WITH DIFFERENTIAL/PLATELET
Abs Immature Granulocytes: 0.05 10*3/uL (ref 0.00–0.07)
Basophils Absolute: 0 10*3/uL (ref 0.0–0.1)
Basophils Relative: 0 %
Eosinophils Absolute: 0.1 10*3/uL (ref 0.0–0.5)
Eosinophils Relative: 1 %
HCT: 38.4 % (ref 36.0–46.0)
Hemoglobin: 12.3 g/dL (ref 12.0–15.0)
Immature Granulocytes: 0 %
Lymphocytes Relative: 15 %
Lymphs Abs: 1.8 10*3/uL (ref 0.7–4.0)
MCH: 31.6 pg (ref 26.0–34.0)
MCHC: 32 g/dL (ref 30.0–36.0)
MCV: 98.7 fL (ref 80.0–100.0)
Monocytes Absolute: 1.3 10*3/uL — ABNORMAL HIGH (ref 0.1–1.0)
Monocytes Relative: 11 %
Neutro Abs: 8.9 10*3/uL — ABNORMAL HIGH (ref 1.7–7.7)
Neutrophils Relative %: 73 %
Platelets: 130 10*3/uL — ABNORMAL LOW (ref 150–400)
RBC: 3.89 MIL/uL (ref 3.87–5.11)
RDW: 14.7 % (ref 11.5–15.5)
WBC: 12.2 10*3/uL — ABNORMAL HIGH (ref 4.0–10.5)
nRBC: 0 % (ref 0.0–0.2)

## 2023-05-01 LAB — BASIC METABOLIC PANEL
Anion gap: 14 (ref 5–15)
BUN: 28 mg/dL — ABNORMAL HIGH (ref 8–23)
CO2: 15 mmol/L — ABNORMAL LOW (ref 22–32)
Calcium: 9.2 mg/dL (ref 8.9–10.3)
Chloride: 111 mmol/L (ref 98–111)
Creatinine, Ser: 1.16 mg/dL — ABNORMAL HIGH (ref 0.44–1.00)
GFR, Estimated: 49 mL/min — ABNORMAL LOW (ref >60)
Glucose, Bld: 166 mg/dL — ABNORMAL HIGH (ref 70–99)
Potassium: 3.5 mmol/L (ref 3.5–5.1)
Sodium: 140 mmol/L (ref 135–145)

## 2023-05-01 LAB — CREATININE, SERUM
Creatinine, Ser: 1.14 mg/dL — ABNORMAL HIGH (ref 0.44–1.00)
GFR, Estimated: 51 mL/min — ABNORMAL LOW (ref >60)

## 2023-05-01 LAB — GLUCOSE, CAPILLARY
Glucose-Capillary: 168 mg/dL — ABNORMAL HIGH (ref 70–99)
Glucose-Capillary: 187 mg/dL — ABNORMAL HIGH (ref 70–99)
Glucose-Capillary: 173 mg/dL — ABNORMAL HIGH (ref 70–99)
Glucose-Capillary: 187 mg/dL — ABNORMAL HIGH (ref 70–99)

## 2023-05-01 LAB — C DIFFICILE QUICK SCREEN W PCR REFLEX
C Diff antigen: NEGATIVE
C Diff interpretation: NOT DETECTED
C Diff toxin: NEGATIVE

## 2023-05-01 LAB — CULTURE, BLOOD (ROUTINE X 2): Special Requests: ADEQUATE

## 2023-05-01 LAB — MRSA NEXT GEN BY PCR, NASAL: MRSA by PCR Next Gen: NOT DETECTED

## 2023-05-01 MED ORDER — GLYCOPYRROLATE 1 MG PO TABS
1.0000 mg | ORAL_TABLET | Freq: Two times a day (BID) | ORAL | Status: DC
  Administered 2023-05-01 – 2023-05-03 (×5): 1 mg via ORAL
  Filled 2023-05-01 (×5): qty 1

## 2023-05-01 MED ORDER — ALUM & MAG HYDROXIDE-SIMETH 200-200-20 MG/5ML PO SUSP
15.0000 mL | Freq: Four times a day (QID) | ORAL | Status: DC | PRN
  Administered 2023-05-01 – 2023-05-02 (×3): 15 mL via ORAL
  Filled 2023-05-01 (×3): qty 30

## 2023-05-01 MED ORDER — SODIUM BICARBONATE 650 MG PO TABS
1300.0000 mg | ORAL_TABLET | Freq: Two times a day (BID) | ORAL | Status: DC
  Administered 2023-05-01 – 2023-05-02 (×2): 1300 mg via ORAL
  Filled 2023-05-01: qty 2

## 2023-05-01 NOTE — Plan of Care (Signed)

## 2023-05-01 NOTE — Plan of Care (Signed)
  Problem: Clinical Measurements: Goal: Ability to maintain clinical measurements within normal limits will improve Outcome: Progressing Goal: Respiratory complications will improve Outcome: Progressing Goal: Cardiovascular complication will be avoided Outcome: Progressing   Problem: Nutrition: Goal: Adequate nutrition will be maintained Outcome: Progressing   

## 2023-05-01 NOTE — Progress Notes (Signed)
[facility] PROGRESS NOTE  HAYLIE WESTENHAVER (DOB: July 10, 1950) ZHY:[id number] PCP: Rica Records, FNP  Brief Narrative: KIERNAN DOTTERY is a 72 y.o. female with a history of chronic tracheostomy dependence s/p pneumonia 2022, nocturnal VDRF, chronic combined HFrEF, T2DM, chronic afib, seizure disorder, among others who presented to the ED on 04/29/2023 from home with a few days of feeling unwell, mild abdominal discomfort for which her daughter/caretaker had scheduled GI appointment this coming week, and episodes of vomiting with subsequent respiratory distress. Work up in ED revealed leukocytosis (WBC 14.2k), low grade fever (100.75F), atrial fibrillation with mild tachycardia and modest demand myocardial ischemia (Troponin 38 > 35 without ischemic ECG changes or chest pain). CT suggested areas of colitis and right greater than left lung base infiltrates concerning for aspiration pneumonia. She was admitted this morning for sepsis due to aspiration pneumonia and nonspecified colitis.   Subjective: Some diffuse abdominal pain, an episode of stomach content vomiting reported by daughter at bedside. Amenable to continuing diet. Had some anxiety last night but improved when taken off vent.  Objective: BP (!) 113/43   Pulse 83   Temp 98.9 F (37.2 C) (Oral)   Resp (!) 30   Ht 5\' 6"  (1.676 m)   Wt 77.3 kg   SpO2 99%   BMI 27.51 kg/m   Gen: Nontoxic female in no distress Trach midline Pulm: Crackles at bases anterolaterally though aeration improved. Nonlabored  CV: Irreg irreg, rate in 70's-80's, no MRG GI: Soft, diffusely tender without rebound or guarding, +BS Neuro: Alert and oriented. No new focal deficits.   Assessment & Plan: Aspiration pneumonia and pneumonitis, VDRF, chronic tracheostomy: - Supportive care for tracheostomy, nocturnal vent per RT appreciated. Remains in ICU per protocol.  - D/w Anders Simmonds, NP who knows patient well from trach clinic, adding robinul.  -  Continue antibiotics - MRSA PCR has historically been negative, negative again this admission and history more consistent with aspiration etiology. With renal impairment, we stopped vancomycin.   - SLP evaluation, empiric dysphagia 3 diet for now.  Colitis: ascending and proximal transverse as well as sigmoid regions affected. This is not typical watershed distribution of ischemic colitis, though BP is soft. No bloody diarrhea, though pt has reported hx IBS and chronic diarrhea.  - Will check stool studies, empiric enteric precautions. Had loose stool this morning, status in EMR is sent, will follow up.  - Antiemetic prn.  - Cover with ceftriaxone, flagyl for now given her sepsis and for aspiration coverage as above.  - If unable to initiate diet or new concerning symptoms appear, may need GI consult. Otherwise, has follow up with Ermalinda Memos of [facility] 10/30.   Positive blood culture: 1 of 3 blood cultures with GPCs on gram stain, no rapid ID available at this time. As discussed above, MRSA is felt to be unlikely, though her clinically precarious state argued to restart vancomycin. - To preserve renal function, given no growth in other bottles, we will hold vancomycin for now and monitor on ceftriaxone as above.   Hypothyroidism: Synthroid dose is which is 2.64mcg/kg and TSH was suppressed last month, confirmed still to be low at 0.065 currently.  - Hold synthroid right now, will restart at lower dose and advise PCP follow up for TSH testing in 2-4 weeks.   - Continue home metoprolol  Chronic atrial fibrillation: Rate controlled, though BP is soft.  - Continue eliquis - Continue metoprolol, lower dose 25 > 12.5mg  BID  due to soft BP. Stable.   NAGMA, hypokalemia: Likely due to diarrhea.  - Continue bicarb tabs, increase dose. Support with supplementation while still having poor oral intake, loose stools.   T2DM:  - Continue levemir 10u daily, SSI. At inpatient goal. Titrate  as her oral intake increases.   Anxiety: Continue buspirone   HLD:  - Statin  [name], MD [facility] [email or web address] 05/01/2023, 10:18 AM

## 2023-05-01 NOTE — Evaluation (Signed)
Clinical/Bedside Swallow Evaluation Patient Details  Name: [name] MRN: [id number] Date of Birth: Jun 25, 1951  Today's Date: 05/01/2023 Time: SLP Start Time (ACUTE ONLY): 1505 SLP Stop Time (ACUTE ONLY): 1531 SLP Time Calculation (min) (ACUTE ONLY): 26 min  Past Medical History:  Past Medical History:  Diagnosis Date   Abnormal pulmonary function test    Anemia    H/H of 10/30 with a normal MCV in 12/09   Anxiety    Arthritis    Barrett's esophagus    Diagnosed 1995. Last EGD 2016-NO BARRETT'S.    Chest pain    a. 2022 Cath: nl cors; b. 2008 Neg myoview; c. 01/2014 Cath: Nl cors. EF 40%; c. 08/2020 NSTEMI/Cath [facility]): nl cors.   Chronic anticoagulation    Chronic combined systolic and diastolic CHF (congestive heart failure) (HCC)    a. 10/2014 Echo: EF40%; b. 01/2015 Echo: EF 55-60%; c. 11/2020 Echo: EF 55-60%, mod LVH, sev BAE, mild MR, mod TR. PASP ; d. 01/2021 Echo: EF 45-50%, glob HK, mod conc LVH, mod reduced RV fxn, RVSP 60.29mmHg, Mod TR, mild to mod MR, sev dil LA.   Chronic LBP    Surgical intervention in 1996   Diabetes mellitus, type 2 (HCC)    Insulin therapy; exacerbated by prednisone   Gastroparesis    99% retention 05/2008 on GES   GERD (gastroesophageal reflux disease)    Heart attack (HCC) 08/13/2020   Hiatal hernia    Hyperlipidemia    Hypertension    Hypothyroid    IBS (irritable bowel syndrome)    Obesity    OSA on CPAP    had CPAP and cannot tolerate.   Paroxysmal atrial fibrillation (HCC)    a.CHA2DS2VASc = 6-->eliquis. Also on Amio.   Pulmonary hypertension (HCC) 01/2015   a. 01/2015 RHC: Predominantly pulmonary venous hypertension but may be component of PAH (PA mean 52, PCWP 31); b. 01/2021 Echo: RVSP 60.53mmHg w/ mod reduced RV fxn.   Seizures (HCC)    last seizure was 2 years ago; on keppra for this; unknown etiology   Syncope    a. Admitted 05/2009; magnetic resonance imagin/ MRA - negative; etiology thought to be orthostasis secondary to  drugs and dehydration. b. Syncope 02/2015 also felt 2/2 dehydration.   Past Surgical History:  Past Surgical History:  Procedure Laterality Date   BACK SURGERY  1996   BIOPSY N/A 11/08/2013   Procedure: BIOPSY  / Tissue sampling / ulcers present in small intestine;  Surgeon: West Bali, MD;  [facility];  Service: Endoscopy;  Laterality: N/A;   CARDIAC CATHETERIZATION  2002   CARDIAC CATHETERIZATION N/A 01/26/2015   Procedure: Right Heart Cath;  Surgeon: Laurey Morale, MD;  Location: [facility] INVASIVE CV LAB;  Service: Cardiovascular;  Laterality: N/A;   CARDIOVASCULAR STRESS TEST  2008   Stress nuclear study   CARDIOVERSION N/A 03/06/2015   Procedure: CARDIOVERSION;  Surgeon: Laurey Morale, MD;  Location: [facility] ENDOSCOPY;  Service: Cardiovascular;  Laterality: N/A;   CARPAL TUNNEL RELEASE  1994   CATARACT EXTRACTION W/PHACO Right 01/10/2022   Procedure: CATARACT EXTRACTION PHACO AND INTRAOCULAR LENS PLACEMENT (IOC);  Surgeon: [name], MD;  Location: [facility] ORS;  Service: Ophthalmology;  Laterality: Right;  CDE: 21.51   CATARACT EXTRACTION W/PHACO Left 01/24/2022   Procedure: CATARACT EXTRACTION PHACO AND INTRAOCULAR LENS PLACEMENT (IOC);  Surgeon: [name], MD;  Location: [facility] ORS;  Service: Ophthalmology;  Laterality: Left;  CDE: 17.13   COLONOSCOPY  11/2011  Dr. Darrick Penna: Internal hemorrhoids, mild diverticulosis. Random colon biopsies negative.   COLONOSCOPY N/A 11/08/2013   SLF: Normal mucosa in the terminal ileum/The colon IS redundant/  Moderate diverticulosis throughout the entire colon. ileum bx benign. colon bx benign   CRYOTHERAPY  01/23/2021   Procedure: CRYOTHERAPY;  Surgeon: Lorin Glass, MD;  Location: [facility] ENDOSCOPY;  Service: Pulmonary;;   CRYOTHERAPY  01/24/2021   Procedure: Cloyde Reams;  Surgeon: Lorin Glass, MD;  Location: [facility] ENDOSCOPY;  Service: Pulmonary;;   CRYOTHERAPY  01/26/2021   Procedure: Cloyde Reams;  Surgeon: Lupita Leash, MD;   Location: [facility] ENDOSCOPY;  Service: Cardiopulmonary;;   ESOPHAGOGASTRODUODENOSCOPY  2008   Barrett's without dysplasia. esphagus dilated. antral erosions, h.pylori serologies negative.   ESOPHAGOGASTRODUODENOSCOPY  11/2011   Dr. Darrick Penna: Barrett's esophagus, mild gastritis, diverticulum in the second portion of the duodenum repeat EGD 3 years. Small bowel biopsies negative. Gastric biopsy show reactive gastropathy but no H. pylori. Esophageal biopsies consistent with GERD. Next EGD 11/2014   ESOPHAGOGASTRODUODENOSCOPY N/A 11/21/2014   HKV:QQVZ non-erosive gastritis/irregular z-line   ESOPHAGOGASTRODUODENOSCOPY  03/2022   FOREIGN BODY REMOVAL  01/23/2021   Procedure: FOREIGN BODY REMOVAL;  Surgeon: Lorin Glass, MD;  Location: [facility] ENDOSCOPY;  Service: Pulmonary;;   FOREIGN BODY REMOVAL  01/24/2021   Procedure: FOREIGN BODY REMOVAL;  Surgeon: Lorin Glass, MD;  Location: [facility] ENDOSCOPY;  Service: Pulmonary;;   GIVENS CAPSULE STUDY  12/07/2011   Proximal small bowel, rare AVM. Distal small bowel, multiple ulcers noted   GIVENS CAPSULE STUDY N/A 09/27/2013   Distal small bowel ulcers extending to TI.   GIVENS CAPSULE STUDY N/A 10/10/2013   Procedure: GIVENS CAPSULE STUDY;  Surgeon: West Bali, MD;  [facility];  Service: Endoscopy;  Laterality: N/A;  7:30   HEMOSTASIS CONTROL  01/24/2021   Procedure: HEMOSTASIS CONTROL;  Surgeon: Lorin Glass, MD;  Location: [facility] ENDOSCOPY;  Service: Pulmonary;;   HEMOSTASIS CONTROL  01/26/2021   Procedure: HEMOSTASIS CONTROL;  Surgeon: Lupita Leash, MD;  Location: [facility] ENDOSCOPY;  Service: Cardiopulmonary;;   IR GASTROSTOMY TUBE MOD SED  01/11/2021   IR GASTROSTOMY TUBE REMOVAL  04/13/2021   IR KYPHO THORACIC WITH BONE BIOPSY  02/09/2018   KNEE ARTHROSCOPY WITH MEDIAL MENISECTOMY Right 06/09/2016   Procedure: KNEE ARTHROSCOPY WITH MEDIAL MENISECTOMY;  Surgeon: Vickki Hearing, MD;  Location: [facility]S;  Service: Orthopedics;  Laterality:  Right;  medial and lateral menisectomy   LAMINECTOMY  1995   L4-L5   LAPAROSCOPIC CHOLECYSTECTOMY  1990s   LEFT HEART CATHETERIZATION WITH CORONARY ANGIOGRAM  01/10/2014   Procedure: LEFT HEART CATHETERIZATION WITH CORONARY ANGIOGRAM;  Surgeon: Lesleigh Noe, MD;  Location: [facility] CATH LAB;  Service: Cardiovascular;;   MICROLARYNGOSCOPY WITH LASER AND BALLOON DILATION N/A 11/17/2021   Procedure: MICROLARYNGOSCOPY WITH CO2 LASER, SUBGLOTTIC LARYNGEAL MASS BIOSPY;  Surgeon: Serena Colonel, MD;  Location: [facility] OR;  Service: ENT;  Laterality: N/A;   RIGHT HEART CATHETERIZATION N/A 01/10/2014   Procedure: RIGHT HEART CATH;  Surgeon: Lesleigh Noe, MD;  Location: [facility] CATH LAB;  Service: Cardiovascular;  Laterality: N/A;   TOTAL ABDOMINAL HYSTERECTOMY  1999   TRACHEOSTOMY REVISION N/A 07/18/2021   Procedure: TRACHEOSTOMY REVISION;  Surgeon: Suzanna Obey, MD;  Location: [facility] OR;  Service: ENT;  Laterality: N/A;   TRACHEOSTOMY TUBE PLACEMENT N/A 09/01/2017   Procedure: TRACHEOSTOMY;  Surgeon: Drema Halon, MD;  Location: [facility] OR;  Service: ENT;  Laterality: N/A;   VIDEO BRONCHOSCOPY Bilateral  01/23/2021   Procedure: VIDEO BRONCHOSCOPY WITHOUT FLUORO;  Surgeon: Lorin Glass, MD;  Location: [facility] ENDOSCOPY;  Service: Pulmonary;  Laterality: Bilateral;   VIDEO BRONCHOSCOPY Bilateral 01/24/2021   Procedure: VIDEO BRONCHOSCOPY WITHOUT FLUORO;  Surgeon: Lorin Glass, MD;  Location: [facility] ENDOSCOPY;  Service: Pulmonary;  Laterality: Bilateral;   VIDEO BRONCHOSCOPY N/A 01/26/2021   Procedure: VIDEO BRONCHOSCOPY WITHOUT FLUORO;  Surgeon: Lupita Leash, MD;  Location: [facility] ENDOSCOPY;  Service: Cardiopulmonary;  Laterality: N/A;   HPI:  QUETCY VANDERHEIDE is a 72 y.o. female with a history of chronic tracheostomy dependence s/p pneumonia 2022, nocturnal VDRF, chronic combined HFrEF, T2DM, chronic afib, seizure disorder, among others who presented to the ED on 04/29/2023 from home with a few days of feeling  unwell, mild abdominal discomfort for which her daughter/caretaker had scheduled GI appointment this coming week, and episodes of vomiting with subsequent respiratory distress. Work up in ED revealed leukocytosis (WBC 14.2k), low grade fever (100.17F), atrial fibrillation with mild tachycardia and modest demand myocardial ischemia (Troponin 38 > 35 without ischemic ECG changes or chest pain). CT suggested areas of colitis and right greater than left lung base infiltrates concerning for aspiration pneumonia. She was admitted this morning for sepsis due to aspiration pneumonia and nonspecified colitis. BSE rquested.    Assessment / Plan / Recommendation  Clinical Impression  Clinical swallow evaluation completed at bedside with [name] present. Pt communicates primarily by over-articulating so that others can read her lips. She has an electrolarynx at home, but does not use as Dtr reports it to be slightly cumbersome. She does not currently tolerate a speaking valve for extended period due to CO2 retention, laryngeal granuloma, and possible stenosis. She is followed by Dr Delford Field and will have surgery in January. Pt assessed with ice chips, thin water via straw, puree and regular textures. Pt without signs of aspiration and no reports of globus. Recommend regular textures and thin liquids with aspiration and reflux precautions, OK for PO medications whole with thin, Pt alert and upright for all eating and drinking. SLP encouraged family to bring her electrolarynx to follow appointmet [facility] to facilitate use. No further SLP services indicated at this time. SLP will sign off. SLP Visit Diagnosis: Dysphagia, unspecified (R13.10)    Aspiration Risk  Mild aspiration risk    Diet Recommendation Regular;Thin liquid    Liquid Administration via: Cup;Straw Medication Administration: Whole meds with liquid Supervision: Patient able to self feed Compensations: Slow rate;Small sips/bites Postural  Changes: Seated upright at 90 degrees;Remain upright for at least 30 minutes after po intake    Other  Recommendations Oral Care Recommendations: Oral care BID    Recommendations for follow up therapy are one component of a multi-disciplinary discharge planning process, led by the attending physician.  Recommendations may be updated based on patient status, additional functional criteria and insurance authorization.  Follow up Recommendations No SLP follow up      Assistance Recommended at Discharge    Functional Status Assessment Patient has not had a recent decline in their functional status  Frequency and Duration            Prognosis Prognosis for improved oropharyngeal function: Good      Swallow Study   General Date of Onset: 04/30/23 HPI: [name] is a 72 y.o. female with a history of chronic tracheostomy dependence s/p pneumonia 2022, nocturnal VDRF, chronic combined HFrEF, T2DM, chronic afib, seizure disorder, among others who presented to the ED on  04/29/2023 from home with a few days of feeling unwell, mild abdominal discomfort for which her daughter/caretaker had scheduled GI appointment this coming week, and episodes of vomiting with subsequent respiratory distress. Work up in ED revealed leukocytosis (WBC 14.2k), low grade fever (100.72F), atrial fibrillation with mild tachycardia and modest demand myocardial ischemia (Troponin 38 > 35 without ischemic ECG changes or chest pain). CT suggested areas of colitis and right greater than left lung base infiltrates concerning for aspiration pneumonia. She was admitted this morning for sepsis due to aspiration pneumonia and nonspecified colitis. BSE rquested. Type of Study: Bedside Swallow Evaluation Previous Swallow Assessment: FEES 02/2021 Diet Prior to this Study: Dysphagia 3 (mechanical soft);Thin liquids (Level 0) Temperature Spikes Noted: No Respiratory Status: Trach Collar History of Recent Intubation:  (Pt has  trach) Behavior/Cognition: Alert;Cooperative;Pleasant mood Oral Cavity Assessment: Within Functional Limits Oral Care Completed by SLP: Recent completion by staff Oral Cavity - Dentition: Missing dentition;Adequate natural dentition Vision: Functional for self-feeding Self-Feeding Abilities: Able to feed self Patient Positioning: Upright in bed Baseline Vocal Quality: Aphonic (Pt has trach) Volitional Swallow: Able to elicit    Oral/Motor/Sensory Function Overall Oral Motor/Sensory Function: Within functional limits   Ice Chips Ice chips: Within functional limits Presentation: Spoon   Thin Liquid Thin Liquid: Within functional limits Presentation: Cup;Self Fed;Straw    Nectar Thick Nectar Thick Liquid: Not tested   Honey Thick Honey Thick Liquid: Not tested   Puree Puree: Within functional limits Presentation: Spoon   Solid     Solid: Impaired Oral Phase Impairments: Reduced labial seal;Impaired mastication Oral Phase Functional Implications: Prolonged oral transit     Thank you,  Havery Moros, CCC-SLP [phone]  Woodson Macha 05/01/2023,3:47 PM

## 2023-05-01 NOTE — TOC CM/SW Note (Signed)
Transition of Care [facility]) - Inpatient Brief Assessment   Patient Details  Name: [name] MRN: [id number] Date of Birth: [birth year]  Transition of Care [facility]) CM/SW Contact:    Villa Herb, LCSWA Phone Number: 05/01/2023, 10:10 AM   Clinical Narrative: Transition of Care Department [facility]) has reviewed patient and no TOC needs have been identified at this time. We will continue to monitor patient advancement through interdisciplinary progression rounds. If new patient transition needs arise, please place a TOC consult.  Transition of Care Asessment: Insurance and Status: Insurance coverage has been reviewed Patient has primary care physician: Yes Home environment has been reviewed: from home Prior level of function:: daughter offers assistance Prior/Current Home Services: No current home services Social Determinants of Health Reivew: SDOH reviewed no interventions necessary Readmission risk has been reviewed: Yes Transition of care needs: no transition of care needs at this time

## 2023-05-02 DIAGNOSIS — Z8719 Personal history of other diseases of the digestive system: Secondary | ICD-10-CM | POA: Diagnosis not present

## 2023-05-02 DIAGNOSIS — R112 Nausea with vomiting, unspecified: Secondary | ICD-10-CM | POA: Diagnosis not present

## 2023-05-02 DIAGNOSIS — R6510 Systemic inflammatory response syndrome (SIRS) of non-infectious origin without acute organ dysfunction: Secondary | ICD-10-CM | POA: Diagnosis not present

## 2023-05-02 DIAGNOSIS — K529 Noninfective gastroenteritis and colitis, unspecified: Secondary | ICD-10-CM

## 2023-05-02 LAB — GASTROINTESTINAL PANEL BY PCR, STOOL (REPLACES STOOL CULTURE)

## 2023-05-02 LAB — BASIC METABOLIC PANEL
Anion gap: 9 (ref 5–15)
BUN: 22 mg/dL (ref 8–23)
CO2: 22 mmol/L (ref 22–32)
Calcium: 9.2 mg/dL (ref 8.9–10.3)
Chloride: 111 mmol/L (ref 98–111)
Creatinine, Ser: 1.04 mg/dL — ABNORMAL HIGH (ref 0.44–1.00)
GFR, Estimated: 56 mL/min — ABNORMAL LOW (ref >60)
Glucose, Bld: 198 mg/dL — ABNORMAL HIGH (ref 70–99)
Potassium: 3.6 mmol/L (ref 3.5–5.1)
Sodium: 142 mmol/L (ref 135–145)

## 2023-05-02 LAB — CBC
HCT: 39.7 % (ref 36.0–46.0)
Hemoglobin: 12.2 g/dL (ref 12.0–15.0)
MCH: 30.7 pg (ref 26.0–34.0)
MCHC: 30.7 g/dL (ref 30.0–36.0)
MCV: 99.7 fL (ref 80.0–100.0)
Platelets: 137 10*3/uL — ABNORMAL LOW (ref 150–400)
RBC: 3.98 MIL/uL (ref 3.87–5.11)
RDW: 14.1 % (ref 11.5–15.5)
WBC: 10.7 10*3/uL — ABNORMAL HIGH (ref 4.0–10.5)
nRBC: 0 % (ref 0.0–0.2)

## 2023-05-02 LAB — GLUCOSE, CAPILLARY
Glucose-Capillary: 186 mg/dL — ABNORMAL HIGH (ref 70–99)
Glucose-Capillary: 180 mg/dL — ABNORMAL HIGH (ref 70–99)
Glucose-Capillary: 176 mg/dL — ABNORMAL HIGH (ref 70–99)
Glucose-Capillary: 153 mg/dL — ABNORMAL HIGH (ref 70–99)

## 2023-05-02 MED ORDER — DICYCLOMINE HCL 10 MG PO CAPS: 10.0000 mg | ORAL_CAPSULE | Freq: Three times a day (TID) | ORAL | Status: DC | PRN

## 2023-05-02 MED ORDER — PROCHLORPERAZINE EDISYLATE 10 MG/2ML IJ SOLN
10.0000 mg | Freq: Four times a day (QID) | INTRAMUSCULAR | Status: DC | PRN
  Administered 2023-05-02: 10 mg via INTRAVENOUS
  Filled 2023-05-02: qty 2

## 2023-05-02 MED ORDER — METOPROLOL TARTRATE 25 MG PO TABS
12.5000 mg | ORAL_TABLET | Freq: Once | ORAL | Status: AC
  Administered 2023-05-02: 12.5 mg via ORAL
  Filled 2023-05-02: qty 1

## 2023-05-02 MED ORDER — PANTOPRAZOLE SODIUM 40 MG PO TBEC
40.0000 mg | DELAYED_RELEASE_TABLET | Freq: Two times a day (BID) | ORAL | Status: DC
  Administered 2023-05-02 – 2023-05-03 (×2): 40 mg via ORAL
  Filled 2023-05-02 (×2): qty 1

## 2023-05-02 MED ORDER — METOPROLOL TARTRATE 25 MG PO TABS
25.0000 mg | ORAL_TABLET | Freq: Two times a day (BID) | ORAL | Status: DC
  Administered 2023-05-02 – 2023-05-03 (×2): 25 mg via ORAL
  Filled 2023-05-02 (×2): qty 1

## 2023-05-02 NOTE — Progress Notes (Signed)
[facility] PROGRESS NOTE  [name] (DOB: 03/12/1951) GLO:[id number] PCP: Tara Records, FNP  Brief Narrative: [name] is a 72 y.o. female with a history of chronic tracheostomy dependence s/p pneumonia 2022, nocturnal VDRF, chronic combined HFrEF, T2DM, chronic afib, seizure disorder, among others who presented to the ED on 04/29/2023 from home with a few days of feeling unwell, mild abdominal discomfort for which her daughter/caretaker had scheduled GI appointment this coming week, and episodes of vomiting with subsequent respiratory distress. Work up in ED revealed leukocytosis (WBC 14.2k), low grade fever (100.17F), atrial fibrillation with mild tachycardia and modest demand myocardial ischemia (Troponin 38 > 35 without ischemic ECG changes or chest pain). CT suggested areas of colitis and right greater than left lung base infiltrates concerning for aspiration pneumonia. She was admitted this morning for sepsis due to aspiration pneumonia and nonspecified colitis.   Subjective: Doing better overall, still with vomiting and nausea that did not respond to zofran this morning. Some stools that are not bloody and have more form than PTA. Respiratory status improved/stable, has blood tinged suctioning. Daughter at bedside.  Objective: BP (!) 140/93   Pulse (!) 101   Temp 99.3 F (37.4 C) (Oral)   Resp (!) 21   Ht 5\' 6"  (1.676 m)   Wt 77.6 kg   SpO2 99%   BMI 27.61 kg/m   Gen: Very pleasant frail female in no distress Neck: Trach midline. Pulm: Coarse bases stable but good air movement, no wheezes, nonlabored  CV: Irreg irreg, rate about 100. GI: Soft, minimal tenderness, +BS Neuro: Alert and oriented. No new focal deficits. Ext: Warm, no deformities Skin: No new rashes, lesions or ulcers on visualized skin   Assessment & Plan: Aspiration pneumonia and pneumonitis, VDRF, chronic tracheostomy: - Supportive care for tracheostomy, nocturnal vent per RT  appreciated. Remains in ICU per protocol.  - D/w Tara Simmonds, NP who knows patient well from trach clinic, adding robinul.  - Continue ceftriaxone and flagyl. Plan 5-7 days. - MRSA PCR has historically been negative, negative again this admission and history more consistent with aspiration etiology. With renal impairment, we stopped vancomycin.   - SLP evaluation, empiric dysphagia 3 diet for now.  Colitis: ascending and proximal transverse as well as sigmoid regions affected. This is not typical watershed distribution of ischemic colitis, though BP is soft. No bloody diarrhea, though pt has reported hx IBS and chronic diarrhea.  - Negative GI panel and C. diff. Stool frequency improved, no hematochezia.  - Continue prn zofran, add prn compazine. - Cover with ceftriaxone, flagyl for now given her sepsis and for aspiration coverage as above.  - Daughter had scheduled appt with [name] of [facility] 10/30, would like GI evaluation to see if colonoscopy is recommended. Sent message to [name].   Positive blood culture: 1 of 3 blood cultures with GPCs on gram stain, no rapid ID > ultimately identified as S. capitis suspected contaminant.   Hypothyroidism: Synthroid dose is which is 2.55mcg/kg and TSH was suppressed last month, confirmed still to be low at 0.065 currently.  - Hold synthroid right now, will restart at lower dose and advise PCP follow up for TSH testing in 2-4 weeks.   - Continue home metoprolol  Chronic atrial fibrillation: Rate controlled. - Continue eliquis - Continue metoprolol, lower dose 25 > 12.5mg  BID due to soft BP. BP now rising, will return to home dose 10/29.   NAGMA, hypokalemia: Likely due to  diarrhea. Resolved with improvement in diarrhea.  - Can stop bicarb tabs   T2DM:  - Continue levemir 10u daily, SSI. Plan to titrate as her oral intake increases.   Anxiety: Continue buspirone   HLD:  - Statin  Tara Nine, MD [facility] [email or web address] 05/02/2023, 12:47 PM

## 2023-05-02 NOTE — Consult Note (Signed)
@LOGO @   Referring Provider: [facility]  Primary Care Physician:  Rica Records, FNP Primary Gastroenterologist:  Dr. Marletta Lor  Date of Admission: 04/29/23  Date of Consultation: 05/02/23  Reason for Consultation: Colitis  HPI:  [name] is a 72 y.o. year old female with history of CHF, restrictive lung disease, hospitalized in June 2022 with respiratory failure and required tracheostomy, IDA previously following with hematology and receiving IV iron due to oral iron intolerance, IBS with alternating constipation and diarrhea controlled outpatient with dicyclomine and metamucil, chronic lower abdominal pain worsened postprandially with negative mesenteric [place] in 2021, gastroparesis, GERD, now admitted on 10/26 with aspiration pneumonia and pneumonitis and colitis after presenting with abdominal pain, nausea, vomiting, shortness of breath.  CT A/P with contrast in the ER showed long segment colonic wall thickening involving the ascending and proximal transverse colon as well as distal sigmoid colon and rectum consistent with nonspecific infectious, inflammatory, or ischemic colitis.  Also with heterogenous airspace opacity and/or scarring of the right greater than left lung bases concerning for infection or aspiration.  C. difficile and GI pathogen panel were negative.  She has been treated with ceftriaxone and Flagyl, Zofran for nausea, oxycodone for pain.  WBC count trending down. Appears that diarrhea has been slowing down and stools have been becoming more formed, but patient still reported ongoing vomiting with nausea this morning that did not respond to Zofran, so Compazine was added.  Daughter has asked for GI evaluation while inpatient to see if colonoscopy is recommended.   Today:  Spoke with patient and daughter who is at bedside.   They report increasing abdominal pain as well as looser bowel movements starting last week.  Reports she was having to 3 bowel  movements a day, but much looser in consistency compared to normal.  No BRBPR or melena.  She had continued to take her dicyclomine 4 times a day and Imodium as needed.  On Saturday, she began to feel poorly, weak, felt like her legs were tingling and numb, so she went to lay down and get on the vent.  Her daughter went to try to suction her trach when she had a large episode of emesis.  After that, she became very short of breath and EMS was called.  Since being admitted to the hospital, she reports her abdominal pain is not back to baseline, but improved compared to admission.  Diarrhea has also slowed down quite a bit.  She is actually not had a bowel movement today though she is passing gas.  Daughter was wondering if we should consider a colonoscopy.  Regarding her nausea/vomiting that has continued this admission, patient describes this more as a an indigestion with liquid gurgling back up with associated burning in her esophagus.  She is only receiving pantoprazole once a day since admission.  Has been taking pantoprazole twice daily outpatient which usually keeps reflux well-controlled.  No nausea currently.  States she was not nauseous before or right after eating breakfast this morning, it started later with the liquid gurgling back up.   Respiratory status has improved since admission, but not back to baseline.  Continues with cough, suctioning sputum from trach that is not quite clear.  Prior endoscopic evaluation:  EGD in May 2016 due to anemia and history of Barrett's without evidence of Barrett's. Last colonoscopy in May 2015 with redundant colon, moderate diverticulosis throughout the entire colon.  Capsule in April 2015 with idiopathic small bowel ulcers that have  been present since 2013.  *Had been taking Aleve.  More recently, she has been declining any sort of endoscopic evaluation of anemia unless worsening symptoms.   Past Medical History:  Diagnosis Date   Abnormal pulmonary  function test    Anemia    H/H of 10/30 with a normal MCV in 12/09   Anxiety    Arthritis    Barrett's esophagus    Diagnosed 1995. Last EGD 2016-NO BARRETT'S.    Chest pain    a. 2022 Cath: nl cors; b. 2008 Neg myoview; c. 01/2014 Cath: Nl cors. EF 40%; c. 08/2020 NSTEMI/Cath [facility]): nl cors.   Chronic anticoagulation    Chronic combined systolic and diastolic CHF (congestive heart failure) (HCC)    a. 10/2014 Echo: EF40%; b. 01/2015 Echo: EF 55-60%; c. 11/2020 Echo: EF 55-60%, mod LVH, sev BAE, mild MR, mod TR. PASP ; d. 01/2021 Echo: EF 45-50%, glob HK, mod conc LVH, mod reduced RV fxn, RVSP 60.86mmHg, Mod TR, mild to mod MR, sev dil LA.   Chronic LBP    Surgical intervention in 1996   Diabetes mellitus, type 2 (HCC)    Insulin therapy; exacerbated by prednisone   Gastroparesis    99% retention 05/2008 on GES   GERD (gastroesophageal reflux disease)    Heart attack (HCC) 08/13/2020   Hiatal hernia    Hyperlipidemia    Hypertension    Hypothyroid    IBS (irritable bowel syndrome)    Obesity    OSA on CPAP    had CPAP and cannot tolerate.   Paroxysmal atrial fibrillation (HCC)    a.CHA2DS2VASc = 6-->eliquis. Also on Amio.   Pulmonary hypertension (HCC) 01/2015   a. 01/2015 RHC: Predominantly pulmonary venous hypertension but may be component of PAH (PA mean 52, PCWP 31); b. 01/2021 Echo: RVSP 60.61mmHg w/ mod reduced RV fxn.   Seizures (HCC)    last seizure was 2 years ago; on keppra for this; unknown etiology   Syncope    a. Admitted 05/2009; magnetic resonance imagin/ MRA - negative; etiology thought to be orthostasis secondary to drugs and dehydration. b. Syncope 02/2015 also felt 2/2 dehydration.    Past Surgical History:  Procedure Laterality Date   BACK SURGERY  1996   BIOPSY N/A 11/08/2013   Procedure: BIOPSY  / Tissue sampling / ulcers present in small intestine;  Surgeon: West Bali, MD;  [facility];  Service: Endoscopy;  Laterality: N/A;   CARDIAC  CATHETERIZATION  2002   CARDIAC CATHETERIZATION N/A 01/26/2015   Procedure: Right Heart Cath;  Surgeon: Laurey Morale, MD;  Location: [facility] INVASIVE CV LAB;  Service: Cardiovascular;  Laterality: N/A;   CARDIOVASCULAR STRESS TEST  2008   Stress nuclear study   CARDIOVERSION N/A 03/06/2015   Procedure: CARDIOVERSION;  Surgeon: Laurey Morale, MD;  Location: [facility] ENDOSCOPY;  Service: Cardiovascular;  Laterality: N/A;   CARPAL TUNNEL RELEASE  1994   CATARACT EXTRACTION W/PHACO Right 01/10/2022   Procedure: CATARACT EXTRACTION PHACO AND INTRAOCULAR LENS PLACEMENT (IOC);  Surgeon: [name], MD;  Location: [facility] ORS;  Service: Ophthalmology;  Laterality: Right;  CDE: 21.51   CATARACT EXTRACTION W/PHACO Left 01/24/2022   Procedure: CATARACT EXTRACTION PHACO AND INTRAOCULAR LENS PLACEMENT (IOC);  Surgeon: [name], MD;  Location: [facility] ORS;  Service: Ophthalmology;  Laterality: Left;  CDE: 17.13   COLONOSCOPY  11/2011   Dr. Darrick Penna: Internal hemorrhoids, mild diverticulosis. Random colon biopsies negative.   COLONOSCOPY N/A 11/08/2013   SLF:  Normal mucosa in the terminal ileum/The colon IS redundant/  Moderate diverticulosis throughout the entire colon. ileum bx benign. colon bx benign   CRYOTHERAPY  01/23/2021   Procedure: CRYOTHERAPY;  Surgeon: Lorin Glass, MD;  Location: [facility] ENDOSCOPY;  Service: Pulmonary;;   CRYOTHERAPY  01/24/2021   Procedure: Cloyde Reams;  Surgeon: Lorin Glass, MD;  Location: [facility] ENDOSCOPY;  Service: Pulmonary;;   CRYOTHERAPY  01/26/2021   Procedure: Cloyde Reams;  Surgeon: Lupita Leash, MD;  Location: [facility] ENDOSCOPY;  Service: Cardiopulmonary;;   ESOPHAGOGASTRODUODENOSCOPY  2008   Barrett's without dysplasia. esphagus dilated. antral erosions, h.pylori serologies negative.   ESOPHAGOGASTRODUODENOSCOPY  11/2011   Dr. Darrick Penna: Barrett's esophagus, mild gastritis, diverticulum in the second portion of the duodenum repeat EGD 3 years. Small bowel biopsies negative.  Gastric biopsy show reactive gastropathy but no H. pylori. Esophageal biopsies consistent with GERD. Next EGD 11/2014   ESOPHAGOGASTRODUODENOSCOPY N/A 11/21/2014   YNW:GNFA non-erosive gastritis/irregular z-line   ESOPHAGOGASTRODUODENOSCOPY  03/2022   FOREIGN BODY REMOVAL  01/23/2021   Procedure: FOREIGN BODY REMOVAL;  Surgeon: Lorin Glass, MD;  Location: [facility] ENDOSCOPY;  Service: Pulmonary;;   FOREIGN BODY REMOVAL  01/24/2021   Procedure: FOREIGN BODY REMOVAL;  Surgeon: Lorin Glass, MD;  Location: [facility] ENDOSCOPY;  Service: Pulmonary;;   GIVENS CAPSULE STUDY  12/07/2011   Proximal small bowel, rare AVM. Distal small bowel, multiple ulcers noted   GIVENS CAPSULE STUDY N/A 09/27/2013   Distal small bowel ulcers extending to TI.   GIVENS CAPSULE STUDY N/A 10/10/2013   Procedure: GIVENS CAPSULE STUDY;  Surgeon: West Bali, MD;  [facility];  Service: Endoscopy;  Laterality: N/A;  7:30   HEMOSTASIS CONTROL  01/24/2021   Procedure: HEMOSTASIS CONTROL;  Surgeon: Lorin Glass, MD;  Location: [facility] ENDOSCOPY;  Service: Pulmonary;;   HEMOSTASIS CONTROL  01/26/2021   Procedure: HEMOSTASIS CONTROL;  Surgeon: Lupita Leash, MD;  Location: [facility] ENDOSCOPY;  Service: Cardiopulmonary;;   IR GASTROSTOMY TUBE MOD SED  01/11/2021   IR GASTROSTOMY TUBE REMOVAL  04/13/2021   IR KYPHO THORACIC WITH BONE BIOPSY  02/09/2018   KNEE ARTHROSCOPY WITH MEDIAL MENISECTOMY Right 06/09/2016   Procedure: KNEE ARTHROSCOPY WITH MEDIAL MENISECTOMY;  Surgeon: Vickki Hearing, MD;  Location: [facility]S;  Service: Orthopedics;  Laterality: Right;  medial and lateral menisectomy   LAMINECTOMY  1995   L4-L5   LAPAROSCOPIC CHOLECYSTECTOMY  1990s   LEFT HEART CATHETERIZATION WITH CORONARY ANGIOGRAM  01/10/2014   Procedure: LEFT HEART CATHETERIZATION WITH CORONARY ANGIOGRAM;  Surgeon: Lesleigh Noe, MD;  Location: [facility] CATH LAB;  Service: Cardiovascular;;   MICROLARYNGOSCOPY WITH LASER AND BALLOON DILATION N/A  11/17/2021   Procedure: MICROLARYNGOSCOPY WITH CO2 LASER, SUBGLOTTIC LARYNGEAL MASS BIOSPY;  Surgeon: Serena Colonel, MD;  Location: [facility] OR;  Service: ENT;  Laterality: N/A;   RIGHT HEART CATHETERIZATION N/A 01/10/2014   Procedure: RIGHT HEART CATH;  Surgeon: Lesleigh Noe, MD;  Location: [facility] CATH LAB;  Service: Cardiovascular;  Laterality: N/A;   TOTAL ABDOMINAL HYSTERECTOMY  1999   TRACHEOSTOMY REVISION N/A 07/18/2021   Procedure: TRACHEOSTOMY REVISION;  Surgeon: Suzanna Obey, MD;  Location: [facility] OR;  Service: ENT;  Laterality: N/A;   TRACHEOSTOMY TUBE PLACEMENT N/A 09/01/2017   Procedure: TRACHEOSTOMY;  Surgeon: Drema Halon, MD;  Location: [facility] OR;  Service: ENT;  Laterality: N/A;   VIDEO BRONCHOSCOPY Bilateral 01/23/2021   Procedure: VIDEO BRONCHOSCOPY WITHOUT FLUORO;  Surgeon: Lorin Glass, MD;  Location: [facility] ENDOSCOPY;  Service: Pulmonary;  Laterality: Bilateral;   VIDEO BRONCHOSCOPY Bilateral 01/24/2021   Procedure: VIDEO BRONCHOSCOPY WITHOUT FLUORO;  Surgeon: Lorin Glass, MD;  Location: [facility] ENDOSCOPY;  Service: Pulmonary;  Laterality: Bilateral;   VIDEO BRONCHOSCOPY N/A 01/26/2021   Procedure: VIDEO BRONCHOSCOPY WITHOUT FLUORO;  Surgeon: Lupita Leash, MD;  Location: [facility] ENDOSCOPY;  Service: Cardiopulmonary;  Laterality: N/A;    Prior to Admission medications   Medication Sig Start Date End Date Taking? Authorizing Provider  acetaminophen (TYLENOL) 650 MG CR tablet Take 1,300 mg by mouth 2 (two) times daily.   Yes [provider]  atorvastatin (LIPITOR) 40 MG tablet Take 1 tablet (40 mg total) by mouth daily. 03/29/23  Yes Del Newman Nip, Tenna Child, FNP  busPIRone (BUSPAR) 10 MG tablet Take 1 tablet (10 mg total) by mouth 2 (two) times daily. 04/07/23  Yes Del Newman Nip, Tenna Child, FNP  Cholecalciferol (VITAMIN D-3 PO) Take 1 tablet by mouth daily in the afternoon.   Yes [provider]  dicyclomine (BENTYL) 10 MG capsule TAKE 1 CAPSULE BY MOUTH UP TO FOUR  TIMES DAILY BEFORE MEALS AND AT BEDTIME (STOP IF CONSTIPATION) Patient taking differently: Take 10 mg by mouth 4 (four) times daily -  before meals and at bedtime. 03/24/23  Yes Darsha Zumstein S, PA-C  donepezil (ARICEPT) 5 MG tablet TAKE 1 TABLET BY MOUTH AT BEDTIME FOR dementia 02/21/23  Yes Del Orbe Polanco, Iliana, FNP  ELIQUIS 5 MG TABS tablet TAKE 1 TABLET BY MOUTH TWICE DAILY AS A BLOOD THINNER 02/21/23  Yes Del Newman Nip, [place], FNP  empagliflozin (JARDIANCE) 10 MG TABS tablet Take 1 tablet (10 mg total) by mouth daily. 01/09/23  Yes Del Newman Nip, Tenna Child, FNP  furosemide (LASIX) 40 MG tablet TAKE 1 TABLET BY MOUTH DAILY FOR FLUID 02/21/23  Yes Del Newman Nip, [place], FNP  gabapentin (NEURONTIN) 100 MG capsule TAKE ONE CAPSULE BY MOUTH THREE TIMES DAILY FOR NEUROPATHIC PAIN 02/21/23  Yes Del Newman Nip, [place], FNP  insulin aspart (NOVOLOG FLEXPEN) 100 UNIT/ML FlexPen Inject 0-8 Units into the skin 3 (three) times daily with meals.   Yes [provider]  insulin glargine (LANTUS SOLOSTAR) 100 UNIT/ML Solostar Pen Inject 20 Units into the skin at bedtime.   Yes [provider]  ipratropium-albuterol (DUONEB) 0.5-2.5 (3) MG/3ML SOLN Take 3 mLs by nebulization every 4 (four) hours as needed (Asthma). 11/08/21  Yes [provider]  levETIRAcetam (KEPPRA) 500 MG tablet TAKE 1 TABLET BY MOUTH TWICE DAILY FOR SEIZURE PREVENTION 03/02/23  Yes Del Newman Nip, [place], FNP  levothyroxine (SYNTHROID) 175 MCG tablet Take 1 tablet (175 mcg total) by mouth daily. Patient taking differently: Take 175 mcg by mouth daily before breakfast. 04/02/23  Yes Del Newman Nip, [place], FNP  loperamide (IMODIUM) 2 MG capsule Take 2 mg by mouth every 8 (eight) hours as needed for diarrhea or loose stools. 07/09/21  Yes [provider]  LORazepam (ATIVAN) 1 MG tablet TAKE 1/2 TO 1 TABLET BY MOUTH AT BEDTIME FOR SLEEP Patient taking differently: Take 1 mg by mouth at bedtime. 03/02/23   Yes Del Newman Nip, Tenna Child, FNP  metoprolol tartrate (LOPRESSOR) 25 MG tablet TAKE 1 TABLET BY MOUTH TWICE DAILY FOR HIGH BLOOD PRESSURE OR HEART RATE CONTROL 03/02/23  Yes Del Newman Nip, [place], FNP  mupirocin ointment (BACTROBAN) 2 % Apply 1 Application topically 2 (two) times daily. 03/29/23  Yes Del Newman Nip, Tenna Child, FNP  nitroGLYCERIN (NITROSTAT) 0.4 MG SL tablet Place 0.4 mg  under the tongue every 5 (five) minutes as needed for chest pain. 03/08/22  Yes [provider]  pantoprazole (PROTONIX) 40 MG tablet TAKE 1 TABLET BY MOUTH TWICE DAILY FOR acid reflux 03/02/23  Yes Del Newman Nip, [place], FNP  potassium chloride (KLOR-CON) 10 MEQ tablet Take 1 tablet (10 mEq total) by mouth once for 1 dose. Patient taking differently: Take 20 mEq by mouth daily. 03/24/23 06/10/23 Yes Del Nigel Berthold, FNP  sertraline (ZOLOFT) 25 MG tablet TAKE 1 TABLET BY MOUTH DAILY FOR depression 03/02/23  Yes Del Newman Nip, [place], FNP  traMADol (ULTRAM) 50 MG tablet Take 1 tablet (50 mg total) by mouth 3 (three) times daily as needed for moderate pain or severe pain. Patient taking differently: Take 50 mg by mouth 3 (three) times daily. 03/31/23  Yes Del Newman Nip, Tenna Child, FNP  trolamine salicylate (ASPERCREME) 10 % cream Apply 1 application. topically as needed (knee pain).   Yes [provider]  Continuous Glucose Sensor (FREESTYLE LIBRE 2 SENSOR) MISC FOR CONTINUOUS GLUCOSE MONITORING. 03/03/23   Billie Lade, MD  Continuous Glucose Sensor (FREESTYLE LIBRE 2 SENSOR) MISC Use daily 03/02/23   Del Nigel Berthold, FNP    Current Facility-Administered Medications  Medication Dose Route Frequency Provider Last Rate Last Admin   acetaminophen (TYLENOL) tablet 650 mg  650 mg Oral Q6H PRN Zierle-Ghosh, Asia B, DO   650 mg at 05/02/23 1308   Or   acetaminophen (TYLENOL) suppository 650 mg  650 mg Rectal Q6H PRN Zierle-Ghosh, Asia B, DO       albuterol (PROVENTIL) (2.5 MG/3ML)  0.083% nebulizer solution 2.5 mg  2.5 mg Nebulization Q2H PRN Zierle-Ghosh, Asia B, DO       alum & mag hydroxide-simeth (MAALOX/MYLANTA) 200-200-20 MG/5ML suspension 15 mL  15 mL Oral Q6H PRN [name], MD   15 mL at 05/02/23 1155   apixaban (ELIQUIS) tablet 5 mg  5 mg Oral BID Zierle-Ghosh, Asia B, DO   5 mg at 05/02/23 0912   atorvastatin (LIPITOR) tablet 40 mg  40 mg Oral Daily Zierle-Ghosh, Asia B, DO   40 mg at 05/02/23 0912   busPIRone (BUSPAR) tablet 10 mg  10 mg Oral BID Zierle-Ghosh, Asia B, DO   10 mg at 05/02/23 0912   cefTRIAXone (ROCEPHIN) 1 g in sodium chloride 0.9 % 100 mL IVPB  1 g Intravenous Q24H Hazeline Junker B, MD 200 mL/hr at 05/02/23 0830 Infusion Verify at 05/02/23 0830   Chlorhexidine Gluconate Cloth 2 % PADS 6 each  6 each Topical Daily [name], MD   6 each at 05/02/23 0817   donepezil (ARICEPT) tablet 5 mg  5 mg Oral QHS Zierle-Ghosh, Asia B, DO   5 mg at 05/01/23 2121   gabapentin (NEURONTIN) capsule 100 mg  100 mg Oral QHS Zierle-Ghosh, Asia B, DO   100 mg at 05/01/23 2121   glycopyrrolate (ROBINUL) tablet 1 mg  1 mg Oral BID Simonne Martinet, NP   1 mg at 05/02/23 0912   insulin aspart (novoLOG) injection 0-15 Units  0-15 Units Subcutaneous TID WC Zierle-Ghosh, Asia B, DO   3 Units at 05/02/23 1641   insulin aspart (novoLOG) injection 0-5 Units  0-5 Units Subcutaneous QHS Zierle-Ghosh, Asia B, DO   2 Units at 04/30/23 2100   insulin detemir (LEVEMIR) injection 10 Units  10 Units Subcutaneous QHS Zierle-Ghosh, Asia B, DO   10 Units at 05/01/23 2121   ipratropium-albuterol (DUONEB) 0.5-2.5 (  3) MG/3ML nebulizer solution 3 mL  3 mL Nebulization Q4H PRN Zierle-Ghosh, Asia B, DO       levETIRAcetam (KEPPRA) tablet 500 mg  500 mg Oral BID Zierle-Ghosh, Asia B, DO   500 mg at 05/02/23 0912   LORazepam (ATIVAN) tablet 1 mg  1 mg Oral QHS Zierle-Ghosh, Asia B, DO   1 mg at 05/01/23 2121   melatonin tablet 6 mg  6 mg Oral QHS PRN Zierle-Ghosh, Asia B, DO       metoprolol  tartrate (LOPRESSOR) tablet 25 mg  25 mg Oral BID [name], MD       metroNIDAZOLE (FLAGYL) tablet 500 mg  500 mg Oral Q12H [name], MD   500 mg at 05/02/23 0912   ondansetron (ZOFRAN) tablet 4 mg  4 mg Oral Q6H PRN Zierle-Ghosh, Asia B, DO       Or   ondansetron (ZOFRAN) injection 4 mg  4 mg Intravenous Q6H PRN Zierle-Ghosh, Asia B, DO   4 mg at 05/02/23 0815   oxyCODONE (Oxy IR/ROXICODONE) immediate release tablet 5 mg  5 mg Oral Q4H PRN Zierle-Ghosh, Asia B, DO   5 mg at 05/02/23 1308   pantoprazole (PROTONIX) EC tablet 40 mg  40 mg Oral BID AC Atul Delucia S, PA-C   40 mg at 05/02/23 1641   prochlorperazine (COMPAZINE) injection 10 mg  10 mg Intravenous Q6H PRN [name], MD   10 mg at 05/02/23 1220   sertraline (ZOLOFT) tablet 25 mg  25 mg Oral QHS Zierle-Ghosh, Asia B, DO   25 mg at 05/01/23 2121    Allergies as of 04/29/2023 - Review Complete 04/29/2023  Allergen Reaction Noted   Codeine Nausea And Vomiting and Other (See Comments) 11/28/2019   Dilaudid [hydromorphone hcl] Other (See Comments) 12/31/2015   Reglan [metoclopramide] Other (See Comments) 06/04/2014   Hyoscyamine Other (See Comments) 09/05/2019    Family History  Problem Relation Age of Onset   Hypertension Mother    Alzheimer's disease Mother    Stroke Mother    Heart attack Mother    Breast cancer Sister    Hypertension Other    Heart disease Neg Hx    Colon cancer Neg Hx    Liver disease Neg Hx     Social History   Socioeconomic History   Marital status: Married    Spouse name: Not on file   Number of children: Not on file   Years of education: Not on file   Highest education level: Not on file  Occupational History   Occupation: Passenger transport manager at [employer]    Employer: [employer]    Comment: disabled    Employer: RETIRED  Tobacco Use   Smoking status: Former    Current packs/day: 0.00    Average packs/day: 0.3 packs/day for 27.3 years (6.8 ttl pk-yrs)    Types: Cigarettes    Start date:  02/26/1966    Quit date: 06/30/1993    Years since quitting: 29.8   Smokeless tobacco: Never   Tobacco comments:    quit in 1984  Vaping Use   Vaping status: Never Used  Substance and Sexual Activity   Alcohol use: Not Currently    Comment: Occasional alcohol 30 years ago (09/02/21)   Drug use: No   Sexual activity: Yes    Birth control/protection: None, Surgical  Other Topics Concern   Not on file  Social History Narrative   Sedentary   4 children, "blended family"  Social Determinants of Health   Financial Resource Strain: Not on file  Food Insecurity: No Food Insecurity (04/30/2023)   Hunger Vital Sign    Worried About Running Out of Food in the Last Year: Never true    Ran Out of Food in the Last Year: Never true  Transportation Needs: No Transportation Needs (04/30/2023)   PRAPARE - Administrator, Civil Service (Medical): No    Lack of Transportation (Non-Medical): No  Physical Activity: Not on file  Stress: Not on file  Social Connections: Not on file  Intimate Partner Violence: Not At Risk (04/30/2023)   Humiliation, Afraid, Rape, and Kick questionnaire    Fear of Current or Ex-Partner: No    Emotionally Abused: No    Physically Abused: No    Sexually Abused: No    Review of Systems: Gen: Denies fever, chills, cold or flulike symptoms, presyncope, syncope. CV: Denies chest pain, heart palpitations. Resp: Denies shortness of breath, cough. GI: See HPI GU : Denies urinary burning, urinary frequency, urinary incontinence.  MS: Denies joint pain. Derm: Denies rash. Psych: Denies depression, anxiety. Heme: See HPI  Physical Exam: Vital signs in last 24 hours: Temp:  [97.5 F (36.4 C)-99.3 F (37.4 C)] 99.3 F (37.4 C) (10/29 1201) Pulse Rate:  [67-117] 109 (10/29 1300) Resp:  [17-25] 22 (10/29 1300) BP: (112-155)/(44-93) 150/76 (10/29 1300) SpO2:  [92 %-100 %] 99 % (10/29 1300) FiO2 (%):  [30 %] 30 % (10/29 0600) Weight:  [77.6 kg] 77.6 kg  (10/29 0211) Last BM Date : 05/02/23 General:   Alert, frail, ill-appearing Head:  Normocephalic and atraumatic. Eyes:  Sclera clear, no icterus.   Conjunctiva pink. Ears:  Normal auditory acuity. Lungs:  Clear throughout to auscultation anteriorly.   No wheezes, crackles, or rhonchi. No acute distress. Heart: Irregularly irregular rate and rhythm.  Abdomen:  Soft  and nondistended.  Mild TTP across lower abdomen.  No masses, hepatosplenomegaly or hernias noted. Normal bowel sounds, without guarding, and without rebound.   Rectal:  Deferred  Msk:  Symmetrical without gross deformities. Normal posture. Extremities:  Without edema. Neurologic:  Alert and  oriented x4;  grossly normal neurologically. Skin:  Intact without significant lesions or rashes. Psych:  Normal mood and affect.  Intake/Output from previous day: 10/28 0701 - 10/29 0700 In: 460.1 [P.O.:360; IV Piggyback:100.1] Out: 400 [Urine:400] Intake/Output this shift: Total I/O In: 262.3 [P.O.:240; IV Piggyback:22.3] Out: -   Lab Results: Recent Labs    04/30/23 0518 05/01/23 0524 05/02/23 0518  WBC 17.4* 12.2* 10.7*  HGB 12.2 12.3 12.2  HCT 38.4 38.4 39.7  PLT 148* 130* 137*   BMET Recent Labs    04/30/23 0518 05/01/23 0524 05/02/23 0518  NA 140 140 142  K 3.2* 3.5 3.6  CL 110 111 111  CO2 15* 15* 22  GLUCOSE 172* 166* 198*  BUN 32* 28* 22  CREATININE 1.22* 1.16*  1.14* 1.04*  CALCIUM 9.0 9.2 9.2   LFT Recent Labs    04/29/23 1843 04/30/23 0518  PROT 7.4 6.8  ALBUMIN 4.1 3.7  AST 22 19  ALT 20 17  ALKPHOS 86 79  BILITOT 0.9 1.0   C-Diff Recent Labs    05/01/23 1140  CDIFFTOX NEGATIVE     Impression: 72 y.o. year old female with history of CHF, restrictive lung disease, hospitalized in June 2022 with respiratory failure and required tracheostomy, IDA previously following with hematology and receiving IV iron due to oral iron intolerance,  IBS with alternating constipation and diarrhea  controlled outpatient with dicyclomine and metamucil, chronic lower abdominal pain worsened postprandially with negative mesenteric [place] in 2021, gastroparesis, GERD, now admitted on 10/26 with aspiration pneumonia and pneumonitis and colitis after presenting with abdominal pain, nausea, vomiting, shortness of breath.  GI now consulted per patient's daughter's request due to colitis.  Colitis: CT A/P with contrast 10/26 showed long segment colonic wall thickening involving the ascending and proximal transverse colon as well as distal sigmoid colon and rectum consistent with nonspecific infectious, inflammatory, or ischemic colitis.  C. difficile and GI pathogen panel were negative.  No reports of syncope though she did have soft blood pressures on admission.  Unable to rule out ischemic event, but distribution is not entirely consistent with this.  Prior mesenteric [place] in 2021 was negative.   She has been treated with ceftriaxone and Flagyl.  Abdominal pain improving and diarrhea essentially resolved.  Would recommend completing course of empiric antibiotics and monitoring symptoms.  Would be okay to resume home dicyclomine as she has history of chronic abdominal pain that has been well-controlled with dicyclomine. Could consider updating imaging with CT angio as kidney function would be able to tolerate this. Will discuss with Dr. Levon Hedger.   No need for colonoscopy while inpatient as she is improving, but could consider outpatient colonoscopy, but she is high risk.   Nausea/vomiting: Per patients description, seems most consistent with uncontrolled GERD.  Will increase pantoprazole to twice daily.  Can continue Compazine every 6 hours as needed.  Okay to schedule this every 6 hours if needed.  She does have history of gastroparesis, but has been asymptomatic for quite some time.  It is possible that her acute illness has called gastroparesis flare.  She is intolerant to Reglan due to dyskinesia.  Could  consider erythromycin if absolutely necessary, but will see if we can manage with high-dose PPI and antiemetics.  Plan: Complete course of empiric antibiotics.  Would be ok to resume home dicyclomine 10 mg up to 3 times daily as needed for abdominal pain.  Increase Protonix to 40 mg twice daily. Continue Compazine 10 mg every 6 hours as needed.  Could schedule this if needed. Low fiber diet due to history of gastroparesis.  Remain upright after meals, ideally at least 1+ hours.  No need for inpatient colonoscopy. Could consider outpatient, but will need to discuss again at that time as she is high risk.    LOS: 2 days    05/02/2023, 4:59 PM   Ermalinda Memos, [facility]

## 2023-05-02 NOTE — TOC Progression Note (Signed)
Transition of Care [facility]) - Progression Note    Patient Details  Name: [name] MRN: [id number] Date of Birth: 08/23/1950  [facility] CM/SW Contact  Villa Herb, [place] Phone Number: 05/02/2023, 1:20 PM  Clinical Narrative:    CSW spoke with pts daughter at bedside about HH in the home. Pt currently has a HH RN with [facility]. CSW reached out to [place] with [facility] to confirm this. Pt will need HH resumption orders once. TOC to follow.     Barriers to Discharge: Continued Medical Work up  Expected Discharge Plan and Services                                               Social Determinants of Health (SDOH) Interventions SDOH Screenings   Food Insecurity: No Food Insecurity (04/30/2023)  Housing: Low Risk  (04/30/2023)  Transportation Needs: No Transportation Needs (04/30/2023)  Utilities: Not At Risk (04/30/2023)  Depression (PHQ2-9): Low Risk  (03/29/2023)  Tobacco Use: Medium Risk (04/29/2023)    Readmission Risk Interventions    12/28/2020    2:35 PM 11/24/2020   12:34 PM 09/21/2020   11:46 AM  Readmission Risk Prevention Plan  Transportation Screening Complete Complete Complete  HRI or Home Care Consult  Complete   Social Work Consult for Recovery Care Planning/Counseling  Complete   Palliative Care Screening  Not Applicable   Medication Review Oceanographer) Complete Complete Complete  PCP or Specialist appointment within 3-5 days of discharge   Complete  HRI or Home Care Consult Complete  Complete  SW Recovery Care/Counseling Consult Complete  Complete  Palliative Care Screening --  Complete  Skilled Nursing Facility Patient Refused  Complete

## 2023-05-03 ENCOUNTER — Telehealth (HOSPITAL_COMMUNITY): Payer: Self-pay | Admitting: Pharmacy Technician

## 2023-05-03 ENCOUNTER — Encounter: Payer: Self-pay | Admitting: Physician Assistant

## 2023-05-03 ENCOUNTER — Telehealth (INDEPENDENT_AMBULATORY_CARE_PROVIDER_SITE_OTHER): Payer: Self-pay | Admitting: Gastroenterology

## 2023-05-03 ENCOUNTER — Ambulatory Visit: Payer: PPO | Admitting: Gastroenterology

## 2023-05-03 ENCOUNTER — Other Ambulatory Visit (HOSPITAL_COMMUNITY): Payer: Self-pay

## 2023-05-03 DIAGNOSIS — R6510 Systemic inflammatory response syndrome (SIRS) of non-infectious origin without acute organ dysfunction: Secondary | ICD-10-CM | POA: Diagnosis not present

## 2023-05-03 DIAGNOSIS — J690 Pneumonitis due to inhalation of food and vomit: Secondary | ICD-10-CM | POA: Diagnosis not present

## 2023-05-03 DIAGNOSIS — I482 Chronic atrial fibrillation, unspecified: Secondary | ICD-10-CM

## 2023-05-03 LAB — BASIC METABOLIC PANEL
Anion gap: 9 (ref 5–15)
BUN: 19 mg/dL (ref 8–23)
CO2: 22 mmol/L (ref 22–32)
Calcium: 9 mg/dL (ref 8.9–10.3)
Chloride: 110 mmol/L (ref 98–111)
Creatinine, Ser: 0.97 mg/dL (ref 0.44–1.00)
GFR, Estimated: >60 mL/min (ref >60)
Glucose, Bld: 170 mg/dL — ABNORMAL HIGH (ref 70–99)
Potassium: 3.6 mmol/L (ref 3.5–5.1)
Sodium: 141 mmol/L (ref 135–145)

## 2023-05-03 LAB — CBC
HCT: 37 % (ref 36.0–46.0)
Hemoglobin: 11.6 g/dL — ABNORMAL LOW (ref 12.0–15.0)
MCH: 31 pg (ref 26.0–34.0)
MCHC: 31.4 g/dL (ref 30.0–36.0)
MCV: 98.9 fL (ref 80.0–100.0)
Platelets: 142 10*3/uL — ABNORMAL LOW (ref 150–400)
RBC: 3.74 MIL/uL — ABNORMAL LOW (ref 3.87–5.11)
RDW: 14.1 % (ref 11.5–15.5)
WBC: 8.8 10*3/uL (ref 4.0–10.5)
nRBC: 0 % (ref 0.0–0.2)

## 2023-05-03 LAB — GLUCOSE, CAPILLARY: Glucose-Capillary: 168 mg/dL — ABNORMAL HIGH (ref 70–99)

## 2023-05-03 MED ORDER — GLYCOPYRROLATE 1 MG PO TABS: 1.0000 mg | ORAL_TABLET | Freq: Two times a day (BID) | ORAL | 0 refills | Status: DC

## 2023-05-03 MED ORDER — CEFDINIR 300 MG PO CAPS
300.0000 mg | ORAL_CAPSULE | Freq: Two times a day (BID) | ORAL | Status: DC
  Administered 2023-05-03: 300 mg via ORAL
  Filled 2023-05-03: qty 1

## 2023-05-03 MED ORDER — METRONIDAZOLE 500 MG PO TABS: 500.0000 mg | ORAL_TABLET | Freq: Two times a day (BID) | ORAL | 0 refills | Status: DC

## 2023-05-03 MED ORDER — CEFDINIR 300 MG PO CAPS: 300.0000 mg | ORAL_CAPSULE | Freq: Two times a day (BID) | ORAL | 0 refills | Status: DC

## 2023-05-03 MED ORDER — LEVOTHYROXINE SODIUM 150 MCG PO TABS: 150.0000 ug | ORAL_TABLET | Freq: Every day | ORAL | 1 refills | Status: DC

## 2023-05-03 NOTE — Telephone Encounter (Signed)
Pharmacy Patient Advocate Encounter   Received notification from Fax that prior authorization for Glycopyrrolate 1MG  tablets is required/requested.   Insurance verification completed.   The patient is insured through [facility] ADVANT[employer]/RX ADVANCE .   Per test claim: PA required; PA submitted to above mentioned insurance via CoverMyMeds Key/confirmation #/EOC [id number] Status is pending

## 2023-05-03 NOTE — Discharge Summary (Signed)
Physician Discharge Summary   Patient: [name] MRN: [id number] DOB: May 06, 1951  Admit date:     04/29/2023  Discharge date: 05/03/23  Discharge Physician: Onalee Hua Adriell Polansky   PCP: Rica Records, FNP   Recommendations at discharge:   Please follow up with primary care provider within 1-2 weeks  Please repeat BMP and CBC in one week     Hospital Course: 72 y.o. female with a history of chronic tracheostomy dependence s/p pneumonia 2022, nocturnal VDRF, chronic combined HFrEF, T2DM, chronic afib, seizure disorder, among others who presented to the ED on 04/29/2023 from home with a few days of feeling unwell, mild abdominal discomfort for which her daughter/caretaker had scheduled GI appointment this coming week, and episodes of vomiting with subsequent respiratory distress. Work up in ED revealed leukocytosis (WBC 14.2k), low grade fever (100.33F), atrial fibrillation with mild tachycardia and modest demand myocardial ischemia (Troponin 38 > 35 without ischemic ECG changes or chest pain).  WBC peaked at 17.4 on 04/30/2023 CT suggested areas of colitis and right greater than left lung base infiltrates concerning for aspiration pneumonia. She was admitted for sepsis due to aspiration pneumonia and nonspecified colitis.  The patient was started on ceftriaxone and metronidazole.  The patient was maintained on her usual ventilator settings at bedtime which the patient tolerated well. The patient began to improve clinically.  Her loose stools and abdominal discomfort gradually improved.  WBC improved to 8.8 on the day of discharge.  The patient was given IV fluids with improvement of her electrolytes and renal function.  Serum creatinine was 0.97 on the day of discharge.  The patient's abdominal pain gradually improved and her diet was advanced which she tolerated.  She was seen by speech therapy who recommended a dysphagia 3 diet with thin liquids.  She was seen by gastroenterology who felt that  the patient did not need any further inpatient endoscopic workup as she was clinically improving.  The patient was discharged home with 7 additional days of cefdinir and metronidazole.  She will follow-up with GI in the office.  Assessment and Plan: Aspiration pneumonia and pneumonitis, VDRF, chronic tracheostomy: - Supportive care for tracheostomy, nocturnal vent per RT appreciated. Remains in ICU per protocol.  -Vent setting--PS 8 with PEEP 5, FiO2 = 0.3 - D/w Anders Simmonds, NP who knows patient well from trach clinic, adding robinul.  - Continue ceftriaxone and flagyl>>transition to cefdinir/metro for d/c - MRSA PCR has historically been negative, negative again this admission and history more consistent with aspiration etiology. With renal impairment, we stopped vancomycin.   - SLP evaluation--recommended regular diet with thin liquids   Colitis: ascending and proximal transverse as well as sigmoid regions affected. This is not typical watershed distribution of ischemic colitis, though BP is soft. No bloody diarrhea, though pt has reported hx IBS and chronic diarrhea.  - Negative GI panel and C. diff. Stool frequency improved, no hematochezia.  - Continue prn zofran, add prn compazine. - Cover with ceftriaxone, flagyl for now given her sepsis and for aspiration coverage as above.  - Daughter had scheduled appt with Ermalinda Memos of [facility] 10/30, would like GI evaluation to see if colonoscopy is recommended. -appreciate GI consult>>no further endoscopic work up as pt is improving -cefdinir and metronidazole x 7 more days after dc    Positive blood culture: 1 of 3 blood cultures with GPCs on gram stain, no rapid ID > ultimately identified as S. Capitis -represents contaminant   Hypothyroidism: Synthroid  Physician Discharge Summary   Patient: [name] MRN: [id number] DOB: May 06, 1951  Admit date:     04/29/2023  Discharge date: 05/03/23  Discharge Physician: Onalee Hua Adriell Polansky   PCP: Rica Records, FNP   Recommendations at discharge:   Please follow up with primary care provider within 1-2 weeks  Please repeat BMP and CBC in one week     Hospital Course: 72 y.o. female with a history of chronic tracheostomy dependence s/p pneumonia 2022, nocturnal VDRF, chronic combined HFrEF, T2DM, chronic afib, seizure disorder, among others who presented to the ED on 04/29/2023 from home with a few days of feeling unwell, mild abdominal discomfort for which her daughter/caretaker had scheduled GI appointment this coming week, and episodes of vomiting with subsequent respiratory distress. Work up in ED revealed leukocytosis (WBC 14.2k), low grade fever (100.33F), atrial fibrillation with mild tachycardia and modest demand myocardial ischemia (Troponin 38 > 35 without ischemic ECG changes or chest pain).  WBC peaked at 17.4 on 04/30/2023 CT suggested areas of colitis and right greater than left lung base infiltrates concerning for aspiration pneumonia. She was admitted for sepsis due to aspiration pneumonia and nonspecified colitis.  The patient was started on ceftriaxone and metronidazole.  The patient was maintained on her usual ventilator settings at bedtime which the patient tolerated well. The patient began to improve clinically.  Her loose stools and abdominal discomfort gradually improved.  WBC improved to 8.8 on the day of discharge.  The patient was given IV fluids with improvement of her electrolytes and renal function.  Serum creatinine was 0.97 on the day of discharge.  The patient's abdominal pain gradually improved and her diet was advanced which she tolerated.  She was seen by speech therapy who recommended a dysphagia 3 diet with thin liquids.  She was seen by gastroenterology who felt that  the patient did not need any further inpatient endoscopic workup as she was clinically improving.  The patient was discharged home with 7 additional days of cefdinir and metronidazole.  She will follow-up with GI in the office.  Assessment and Plan: Aspiration pneumonia and pneumonitis, VDRF, chronic tracheostomy: - Supportive care for tracheostomy, nocturnal vent per RT appreciated. Remains in ICU per protocol.  -Vent setting--PS 8 with PEEP 5, FiO2 = 0.3 - D/w Anders Simmonds, NP who knows patient well from trach clinic, adding robinul.  - Continue ceftriaxone and flagyl>>transition to cefdinir/metro for d/c - MRSA PCR has historically been negative, negative again this admission and history more consistent with aspiration etiology. With renal impairment, we stopped vancomycin.   - SLP evaluation--recommended regular diet with thin liquids   Colitis: ascending and proximal transverse as well as sigmoid regions affected. This is not typical watershed distribution of ischemic colitis, though BP is soft. No bloody diarrhea, though pt has reported hx IBS and chronic diarrhea.  - Negative GI panel and C. diff. Stool frequency improved, no hematochezia.  - Continue prn zofran, add prn compazine. - Cover with ceftriaxone, flagyl for now given her sepsis and for aspiration coverage as above.  - Daughter had scheduled appt with Ermalinda Memos of [facility] 10/30, would like GI evaluation to see if colonoscopy is recommended. -appreciate GI consult>>no further endoscopic work up as pt is improving -cefdinir and metronidazole x 7 more days after dc    Positive blood culture: 1 of 3 blood cultures with GPCs on gram stain, no rapid ID > ultimately identified as S. Capitis -represents contaminant   Hypothyroidism: Synthroid  Physician Discharge Summary   Patient: [name] MRN: [id number] DOB: May 06, 1951  Admit date:     04/29/2023  Discharge date: 05/03/23  Discharge Physician: Onalee Hua Adriell Polansky   PCP: Rica Records, FNP   Recommendations at discharge:   Please follow up with primary care provider within 1-2 weeks  Please repeat BMP and CBC in one week     Hospital Course: 72 y.o. female with a history of chronic tracheostomy dependence s/p pneumonia 2022, nocturnal VDRF, chronic combined HFrEF, T2DM, chronic afib, seizure disorder, among others who presented to the ED on 04/29/2023 from home with a few days of feeling unwell, mild abdominal discomfort for which her daughter/caretaker had scheduled GI appointment this coming week, and episodes of vomiting with subsequent respiratory distress. Work up in ED revealed leukocytosis (WBC 14.2k), low grade fever (100.33F), atrial fibrillation with mild tachycardia and modest demand myocardial ischemia (Troponin 38 > 35 without ischemic ECG changes or chest pain).  WBC peaked at 17.4 on 04/30/2023 CT suggested areas of colitis and right greater than left lung base infiltrates concerning for aspiration pneumonia. She was admitted for sepsis due to aspiration pneumonia and nonspecified colitis.  The patient was started on ceftriaxone and metronidazole.  The patient was maintained on her usual ventilator settings at bedtime which the patient tolerated well. The patient began to improve clinically.  Her loose stools and abdominal discomfort gradually improved.  WBC improved to 8.8 on the day of discharge.  The patient was given IV fluids with improvement of her electrolytes and renal function.  Serum creatinine was 0.97 on the day of discharge.  The patient's abdominal pain gradually improved and her diet was advanced which she tolerated.  She was seen by speech therapy who recommended a dysphagia 3 diet with thin liquids.  She was seen by gastroenterology who felt that  the patient did not need any further inpatient endoscopic workup as she was clinically improving.  The patient was discharged home with 7 additional days of cefdinir and metronidazole.  She will follow-up with GI in the office.  Assessment and Plan: Aspiration pneumonia and pneumonitis, VDRF, chronic tracheostomy: - Supportive care for tracheostomy, nocturnal vent per RT appreciated. Remains in ICU per protocol.  -Vent setting--PS 8 with PEEP 5, FiO2 = 0.3 - D/w Anders Simmonds, NP who knows patient well from trach clinic, adding robinul.  - Continue ceftriaxone and flagyl>>transition to cefdinir/metro for d/c - MRSA PCR has historically been negative, negative again this admission and history more consistent with aspiration etiology. With renal impairment, we stopped vancomycin.   - SLP evaluation--recommended regular diet with thin liquids   Colitis: ascending and proximal transverse as well as sigmoid regions affected. This is not typical watershed distribution of ischemic colitis, though BP is soft. No bloody diarrhea, though pt has reported hx IBS and chronic diarrhea.  - Negative GI panel and C. diff. Stool frequency improved, no hematochezia.  - Continue prn zofran, add prn compazine. - Cover with ceftriaxone, flagyl for now given her sepsis and for aspiration coverage as above.  - Daughter had scheduled appt with Ermalinda Memos of [facility] 10/30, would like GI evaluation to see if colonoscopy is recommended. -appreciate GI consult>>no further endoscopic work up as pt is improving -cefdinir and metronidazole x 7 more days after dc    Positive blood culture: 1 of 3 blood cultures with GPCs on gram stain, no rapid ID > ultimately identified as S. Capitis -represents contaminant   Hypothyroidism: Synthroid  66). Occasional sigmoid diverticula, which do not appear to be the nidus of inflammation. Vascular/Lymphatic: Aortic atherosclerosis. No enlarged abdominal or pelvic lymph nodes. Reproductive: Status post hysterectomy. Other: Small fat containing midline ventral hernia (series 2, image 60). No ascites. Musculoskeletal: No acute or significant osseous findings. Superior endplate wedge deformity of T12 status post vertebral cement augmentation (series 4, image 67). IMPRESSION: 1. Long segment colonic wall thickening involving the ascending and proximal transverse colon, as well as a separate segment of the distal sigmoid colon and rectum. Findings are consistent with nonspecific infectious, inflammatory, or ischemic colitis. 2. Heterogeneous airspace opacity and/or scarring of the right-greater-than-left lung bases, concerning for infection or aspiration perhaps acute on chronic. 3. Cardiomegaly. Aortic Atherosclerosis (ICD10-I70.0). Electronically Signed   By:  Jearld Lesch M.D.   On: 04/29/2023 21:11   DG Chest Portable 1 View  Result Date: 04/29/2023 CLINICAL DATA:  Cough and shortness of breath EXAM: PORTABLE CHEST 1 VIEW COMPARISON:  09/24/2021 FINDINGS: Stable cardiomegaly. Mitral annular and aortic atherosclerotic calcification. Tracheostomy tip in the intrathoracic trachea. Diffuse interstitial coarsening similar to prior. Scarring or atelectasis in the left mid lung. No pleural effusion or pneumothorax. IMPRESSION: Cardiomegaly and interstitial edema similar to prior. Electronically Signed   By: Minerva Fester M.D.   On: 04/29/2023 19:40   MR BRAIN WO CONTRAST  Result Date: 04/27/2023  [facility] [address] [place], [place] [address] [phone] NEUROIMAGING REPORT STUDY DATE: 04/27/2023 PATIENT NAME: JOSCELIN LITKE DOB: April 01, 1951 MRN: [id number] EXAM: MRI Brain without contrast ORDERING CLINICIAN: Levert Feinstein MD, PhD CLINICAL HISTORY: 72 year old woman with dementia and gait disturbance COMPARISON FILMS: 11/23/2020 TECHNIQUE: MRI of the brain without contrast was obtained utilizing 5 mm axial slices with T1, T2, T2 flair, SWI and diffusion weighted views.  T1 sagittal and T2 coronal views were obtained. CONTRAST: none IMAGING SITE: [facility], [address] [place], [place] FINDINGS: On sagittal images, the spinal cord is imaged caudally to C2 and is normal in caliber.   The contents of the posterior fossa are of normal size and position.   The pituitary gland and optic chiasm appear normal.   There is mild generalized cortical atrophy, unchanged compared to the 2022 MRI.  No lobar predominance is noted..  There are no abnormal extra-axial collections of fluid.  The cerebellum and brainstem appears normal.   The deep gray matter appears normal.  Some scattered T2/FLAIR hyperintense foci are noted in the cerebral hemispheres, unchanged compared to the previous MRI.Marland Kitchen  Diffusion weighted images are normal.  Susceptibility  weighted images are normal.  There have been bilateral lens replacements.  Otherwise, the orbits appear normal.   The VIIth/VIIIth nerve complex appears normal.  The mastoid air cells appear normal.  There is mucoperiosteal thickening in the left hemisphenoid.  The other paranasal sinuses appear normal.  Flow voids are identified within the major intracerebral arteries.     This MRI of the brain without contrast shows the following: Mild generalized cortical atrophy, unchanged compared to the 2022 MRI Small number of T2/FLAIR hyperintense foci in the cerebral hemispheres consistent with minimal chronic microvascular ischemic change, unchanged compared to the 2022 MRI and normal for age. Left chronic sphenoid sinusitis No acute findings. INTERPRETING PHYSICIAN: Richard A. Epimenio Foot, MD, PhD, FAAN Certified in  Neuroimaging by [employer]    Microbiology: Results for orders placed or performed during the hospital encounter of 04/29/23  Culture, blood (routine x 2)     Status: None (  66). Occasional sigmoid diverticula, which do not appear to be the nidus of inflammation. Vascular/Lymphatic: Aortic atherosclerosis. No enlarged abdominal or pelvic lymph nodes. Reproductive: Status post hysterectomy. Other: Small fat containing midline ventral hernia (series 2, image 60). No ascites. Musculoskeletal: No acute or significant osseous findings. Superior endplate wedge deformity of T12 status post vertebral cement augmentation (series 4, image 67). IMPRESSION: 1. Long segment colonic wall thickening involving the ascending and proximal transverse colon, as well as a separate segment of the distal sigmoid colon and rectum. Findings are consistent with nonspecific infectious, inflammatory, or ischemic colitis. 2. Heterogeneous airspace opacity and/or scarring of the right-greater-than-left lung bases, concerning for infection or aspiration perhaps acute on chronic. 3. Cardiomegaly. Aortic Atherosclerosis (ICD10-I70.0). Electronically Signed   By:  Jearld Lesch M.D.   On: 04/29/2023 21:11   DG Chest Portable 1 View  Result Date: 04/29/2023 CLINICAL DATA:  Cough and shortness of breath EXAM: PORTABLE CHEST 1 VIEW COMPARISON:  09/24/2021 FINDINGS: Stable cardiomegaly. Mitral annular and aortic atherosclerotic calcification. Tracheostomy tip in the intrathoracic trachea. Diffuse interstitial coarsening similar to prior. Scarring or atelectasis in the left mid lung. No pleural effusion or pneumothorax. IMPRESSION: Cardiomegaly and interstitial edema similar to prior. Electronically Signed   By: Minerva Fester M.D.   On: 04/29/2023 19:40   MR BRAIN WO CONTRAST  Result Date: 04/27/2023  [facility] [address] [place], [place] [address] [phone] NEUROIMAGING REPORT STUDY DATE: 04/27/2023 PATIENT NAME: JOSCELIN LITKE DOB: April 01, 1951 MRN: [id number] EXAM: MRI Brain without contrast ORDERING CLINICIAN: Levert Feinstein MD, PhD CLINICAL HISTORY: 72 year old woman with dementia and gait disturbance COMPARISON FILMS: 11/23/2020 TECHNIQUE: MRI of the brain without contrast was obtained utilizing 5 mm axial slices with T1, T2, T2 flair, SWI and diffusion weighted views.  T1 sagittal and T2 coronal views were obtained. CONTRAST: none IMAGING SITE: [facility], [address] [place], [place] FINDINGS: On sagittal images, the spinal cord is imaged caudally to C2 and is normal in caliber.   The contents of the posterior fossa are of normal size and position.   The pituitary gland and optic chiasm appear normal.   There is mild generalized cortical atrophy, unchanged compared to the 2022 MRI.  No lobar predominance is noted..  There are no abnormal extra-axial collections of fluid.  The cerebellum and brainstem appears normal.   The deep gray matter appears normal.  Some scattered T2/FLAIR hyperintense foci are noted in the cerebral hemispheres, unchanged compared to the previous MRI.Marland Kitchen  Diffusion weighted images are normal.  Susceptibility  weighted images are normal.  There have been bilateral lens replacements.  Otherwise, the orbits appear normal.   The VIIth/VIIIth nerve complex appears normal.  The mastoid air cells appear normal.  There is mucoperiosteal thickening in the left hemisphenoid.  The other paranasal sinuses appear normal.  Flow voids are identified within the major intracerebral arteries.     This MRI of the brain without contrast shows the following: Mild generalized cortical atrophy, unchanged compared to the 2022 MRI Small number of T2/FLAIR hyperintense foci in the cerebral hemispheres consistent with minimal chronic microvascular ischemic change, unchanged compared to the 2022 MRI and normal for age. Left chronic sphenoid sinusitis No acute findings. INTERPRETING PHYSICIAN: Richard A. Epimenio Foot, MD, PhD, FAAN Certified in  Neuroimaging by [employer]    Microbiology: Results for orders placed or performed during the hospital encounter of 04/29/23  Culture, blood (routine x 2)     Status: None (  Physician Discharge Summary   Patient: [name] MRN: [id number] DOB: May 06, 1951  Admit date:     04/29/2023  Discharge date: 05/03/23  Discharge Physician: Onalee Hua Adriell Polansky   PCP: Rica Records, FNP   Recommendations at discharge:   Please follow up with primary care provider within 1-2 weeks  Please repeat BMP and CBC in one week     Hospital Course: 72 y.o. female with a history of chronic tracheostomy dependence s/p pneumonia 2022, nocturnal VDRF, chronic combined HFrEF, T2DM, chronic afib, seizure disorder, among others who presented to the ED on 04/29/2023 from home with a few days of feeling unwell, mild abdominal discomfort for which her daughter/caretaker had scheduled GI appointment this coming week, and episodes of vomiting with subsequent respiratory distress. Work up in ED revealed leukocytosis (WBC 14.2k), low grade fever (100.33F), atrial fibrillation with mild tachycardia and modest demand myocardial ischemia (Troponin 38 > 35 without ischemic ECG changes or chest pain).  WBC peaked at 17.4 on 04/30/2023 CT suggested areas of colitis and right greater than left lung base infiltrates concerning for aspiration pneumonia. She was admitted for sepsis due to aspiration pneumonia and nonspecified colitis.  The patient was started on ceftriaxone and metronidazole.  The patient was maintained on her usual ventilator settings at bedtime which the patient tolerated well. The patient began to improve clinically.  Her loose stools and abdominal discomfort gradually improved.  WBC improved to 8.8 on the day of discharge.  The patient was given IV fluids with improvement of her electrolytes and renal function.  Serum creatinine was 0.97 on the day of discharge.  The patient's abdominal pain gradually improved and her diet was advanced which she tolerated.  She was seen by speech therapy who recommended a dysphagia 3 diet with thin liquids.  She was seen by gastroenterology who felt that  the patient did not need any further inpatient endoscopic workup as she was clinically improving.  The patient was discharged home with 7 additional days of cefdinir and metronidazole.  She will follow-up with GI in the office.  Assessment and Plan: Aspiration pneumonia and pneumonitis, VDRF, chronic tracheostomy: - Supportive care for tracheostomy, nocturnal vent per RT appreciated. Remains in ICU per protocol.  -Vent setting--PS 8 with PEEP 5, FiO2 = 0.3 - D/w Anders Simmonds, NP who knows patient well from trach clinic, adding robinul.  - Continue ceftriaxone and flagyl>>transition to cefdinir/metro for d/c - MRSA PCR has historically been negative, negative again this admission and history more consistent with aspiration etiology. With renal impairment, we stopped vancomycin.   - SLP evaluation--recommended regular diet with thin liquids   Colitis: ascending and proximal transverse as well as sigmoid regions affected. This is not typical watershed distribution of ischemic colitis, though BP is soft. No bloody diarrhea, though pt has reported hx IBS and chronic diarrhea.  - Negative GI panel and C. diff. Stool frequency improved, no hematochezia.  - Continue prn zofran, add prn compazine. - Cover with ceftriaxone, flagyl for now given her sepsis and for aspiration coverage as above.  - Daughter had scheduled appt with Ermalinda Memos of [facility] 10/30, would like GI evaluation to see if colonoscopy is recommended. -appreciate GI consult>>no further endoscopic work up as pt is improving -cefdinir and metronidazole x 7 more days after dc    Positive blood culture: 1 of 3 blood cultures with GPCs on gram stain, no rapid ID > ultimately identified as S. Capitis -represents contaminant   Hypothyroidism: Synthroid  66). Occasional sigmoid diverticula, which do not appear to be the nidus of inflammation. Vascular/Lymphatic: Aortic atherosclerosis. No enlarged abdominal or pelvic lymph nodes. Reproductive: Status post hysterectomy. Other: Small fat containing midline ventral hernia (series 2, image 60). No ascites. Musculoskeletal: No acute or significant osseous findings. Superior endplate wedge deformity of T12 status post vertebral cement augmentation (series 4, image 67). IMPRESSION: 1. Long segment colonic wall thickening involving the ascending and proximal transverse colon, as well as a separate segment of the distal sigmoid colon and rectum. Findings are consistent with nonspecific infectious, inflammatory, or ischemic colitis. 2. Heterogeneous airspace opacity and/or scarring of the right-greater-than-left lung bases, concerning for infection or aspiration perhaps acute on chronic. 3. Cardiomegaly. Aortic Atherosclerosis (ICD10-I70.0). Electronically Signed   By:  Jearld Lesch M.D.   On: 04/29/2023 21:11   DG Chest Portable 1 View  Result Date: 04/29/2023 CLINICAL DATA:  Cough and shortness of breath EXAM: PORTABLE CHEST 1 VIEW COMPARISON:  09/24/2021 FINDINGS: Stable cardiomegaly. Mitral annular and aortic atherosclerotic calcification. Tracheostomy tip in the intrathoracic trachea. Diffuse interstitial coarsening similar to prior. Scarring or atelectasis in the left mid lung. No pleural effusion or pneumothorax. IMPRESSION: Cardiomegaly and interstitial edema similar to prior. Electronically Signed   By: Minerva Fester M.D.   On: 04/29/2023 19:40   MR BRAIN WO CONTRAST  Result Date: 04/27/2023  [facility] [address] [place], [place] [address] [phone] NEUROIMAGING REPORT STUDY DATE: 04/27/2023 PATIENT NAME: JOSCELIN LITKE DOB: April 01, 1951 MRN: [id number] EXAM: MRI Brain without contrast ORDERING CLINICIAN: Levert Feinstein MD, PhD CLINICAL HISTORY: 72 year old woman with dementia and gait disturbance COMPARISON FILMS: 11/23/2020 TECHNIQUE: MRI of the brain without contrast was obtained utilizing 5 mm axial slices with T1, T2, T2 flair, SWI and diffusion weighted views.  T1 sagittal and T2 coronal views were obtained. CONTRAST: none IMAGING SITE: [facility], [address] [place], [place] FINDINGS: On sagittal images, the spinal cord is imaged caudally to C2 and is normal in caliber.   The contents of the posterior fossa are of normal size and position.   The pituitary gland and optic chiasm appear normal.   There is mild generalized cortical atrophy, unchanged compared to the 2022 MRI.  No lobar predominance is noted..  There are no abnormal extra-axial collections of fluid.  The cerebellum and brainstem appears normal.   The deep gray matter appears normal.  Some scattered T2/FLAIR hyperintense foci are noted in the cerebral hemispheres, unchanged compared to the previous MRI.Marland Kitchen  Diffusion weighted images are normal.  Susceptibility  weighted images are normal.  There have been bilateral lens replacements.  Otherwise, the orbits appear normal.   The VIIth/VIIIth nerve complex appears normal.  The mastoid air cells appear normal.  There is mucoperiosteal thickening in the left hemisphenoid.  The other paranasal sinuses appear normal.  Flow voids are identified within the major intracerebral arteries.     This MRI of the brain without contrast shows the following: Mild generalized cortical atrophy, unchanged compared to the 2022 MRI Small number of T2/FLAIR hyperintense foci in the cerebral hemispheres consistent with minimal chronic microvascular ischemic change, unchanged compared to the 2022 MRI and normal for age. Left chronic sphenoid sinusitis No acute findings. INTERPRETING PHYSICIAN: Richard A. Epimenio Foot, MD, PhD, FAAN Certified in  Neuroimaging by [employer]    Microbiology: Results for orders placed or performed during the hospital encounter of 04/29/23  Culture, blood (routine x 2)     Status: None (

## 2023-05-03 NOTE — Telephone Encounter (Signed)
Patient discharged from hospital today. Seen by GI service during her admission, I have requested our team set up 2-3 week follow up with [place] as an outpatient.

## 2023-05-03 NOTE — Plan of Care (Signed)

## 2023-05-03 NOTE — Hospital Course (Signed)
72 y.o. female with a history of chronic tracheostomy dependence s/p pneumonia 2022, nocturnal VDRF, chronic combined HFrEF, T2DM, chronic afib, seizure disorder, among others who presented to the ED on 04/29/2023 from home with a few days of feeling unwell, mild abdominal discomfort for which her daughter/caretaker had scheduled GI appointment this coming week, and episodes of vomiting with subsequent respiratory distress. Work up in ED revealed leukocytosis (WBC 14.2k), low grade fever (100.32F), atrial fibrillation with mild tachycardia and modest demand myocardial ischemia (Troponin 38 > 35 without ischemic ECG changes or chest pain).  WBC peaked at 17.4 on 04/30/2023 CT suggested areas of colitis and right greater than left lung base infiltrates concerning for aspiration pneumonia. She was admitted for sepsis due to aspiration pneumonia and nonspecified colitis.  The patient was started on ceftriaxone and metronidazole.  The patient was maintained on her usual ventilator settings at bedtime which the patient tolerated well. The patient began to improve clinically.  Her loose stools and abdominal discomfort gradually improved.  WBC improved to 8.8 on the day of discharge.  The patient was given IV fluids with improvement of her electrolytes and renal function.  Serum creatinine was 0.97 on the day of discharge.  The patient's abdominal pain gradually improved and her diet was advanced which she tolerated.  She was seen by speech therapy who recommended a dysphagia 3 diet with thin liquids.  She was seen by gastroenterology who felt that the patient did not need any further inpatient endoscopic workup as she was clinically improving.  The patient was discharged home with 7 additional days of cefdinir and metronidazole.  She will follow-up with GI in the office.

## 2023-05-03 NOTE — Care Management Important Message (Signed)
Important Message  Patient Details  Name: [name] MRN: [id number] Date of Birth: 12-11-1950   Important Message Given:  Yes - Medicare IM     Corey Harold 05/03/2023, 9:08 AM

## 2023-05-04 ENCOUNTER — Telehealth: Payer: Self-pay

## 2023-05-04 ENCOUNTER — Other Ambulatory Visit (HOSPITAL_COMMUNITY): Payer: Self-pay

## 2023-05-04 LAB — CULTURE, BLOOD (ROUTINE X 2): Culture: NO GROWTH

## 2023-05-04 NOTE — Telephone Encounter (Signed)
Pharmacy Patient Advocate Encounter  Received notification from [facility] [employer] that Prior Authorization for Glycopyrrolate 1MG  tablets  has been APPROVED from 05/03/2023 to 08/03/2023. Ran test claim, Copay is $0.66. This test claim was processed through [facility]- copay amounts may vary at other pharmacies due to pharmacy/plan contracts, or as the patient moves through the different stages of their insurance plan.   PA #/Case ID/Reference #:  [id number]

## 2023-05-04 NOTE — Transitions of Care (Post Inpatient/ED Visit) (Signed)
05/04/2023  Name: [name] MRN: [id number] DOB: [birth year]  Today's TOC FU Call Status: Today's TOC FU Call Status:: Unsuccessful Call (1st Attempt) Unsuccessful Call (1st Attempt) Date: 05/04/23  Attempted to reach the patient regarding the most recent Inpatient/ED visit.  Follow Up Plan: Additional outreach attempts will be made to reach the patient to complete the Transitions of Care (Post Inpatient/ED visit) call.   Lonia Chimera, RN, BSN, CEN [employer]- Transition of Care Team.  [facility] [phone]

## 2023-05-05 ENCOUNTER — Telehealth: Payer: Self-pay

## 2023-05-05 NOTE — Transitions of Care (Post Inpatient/ED Visit) (Signed)
   05/05/2023  Name: [name] MRN: [id number] DOB: [birth year]  Today's TOC FU Call Status: Today's TOC FU Call Status:: Unsuccessful Call (2nd Attempt) Unsuccessful Call (2nd Attempt) Date: 05/05/23  Attempted to reach the patient regarding the most recent Inpatient/ED visit.  Follow Up Plan: Additional outreach attempts will be made to reach the patient to complete the Transitions of Care (Post Inpatient/ED visit) call.   Lonia Chimera, RN, BSN, CEN [employer]- Transition of Care Team.  [facility] [phone]

## 2023-05-08 ENCOUNTER — Telehealth: Payer: Self-pay

## 2023-05-08 IMAGING — DX DG CHEST 1V PORT
1 series · 1 of 1 positions shown · non-contrast
Comparison: 12/21/2020 at 10 a.m.

CLINICAL DATA: Intubation.  Short of breath.

EXAM:
PORTABLE CHEST 1 VIEW

[chest ap]
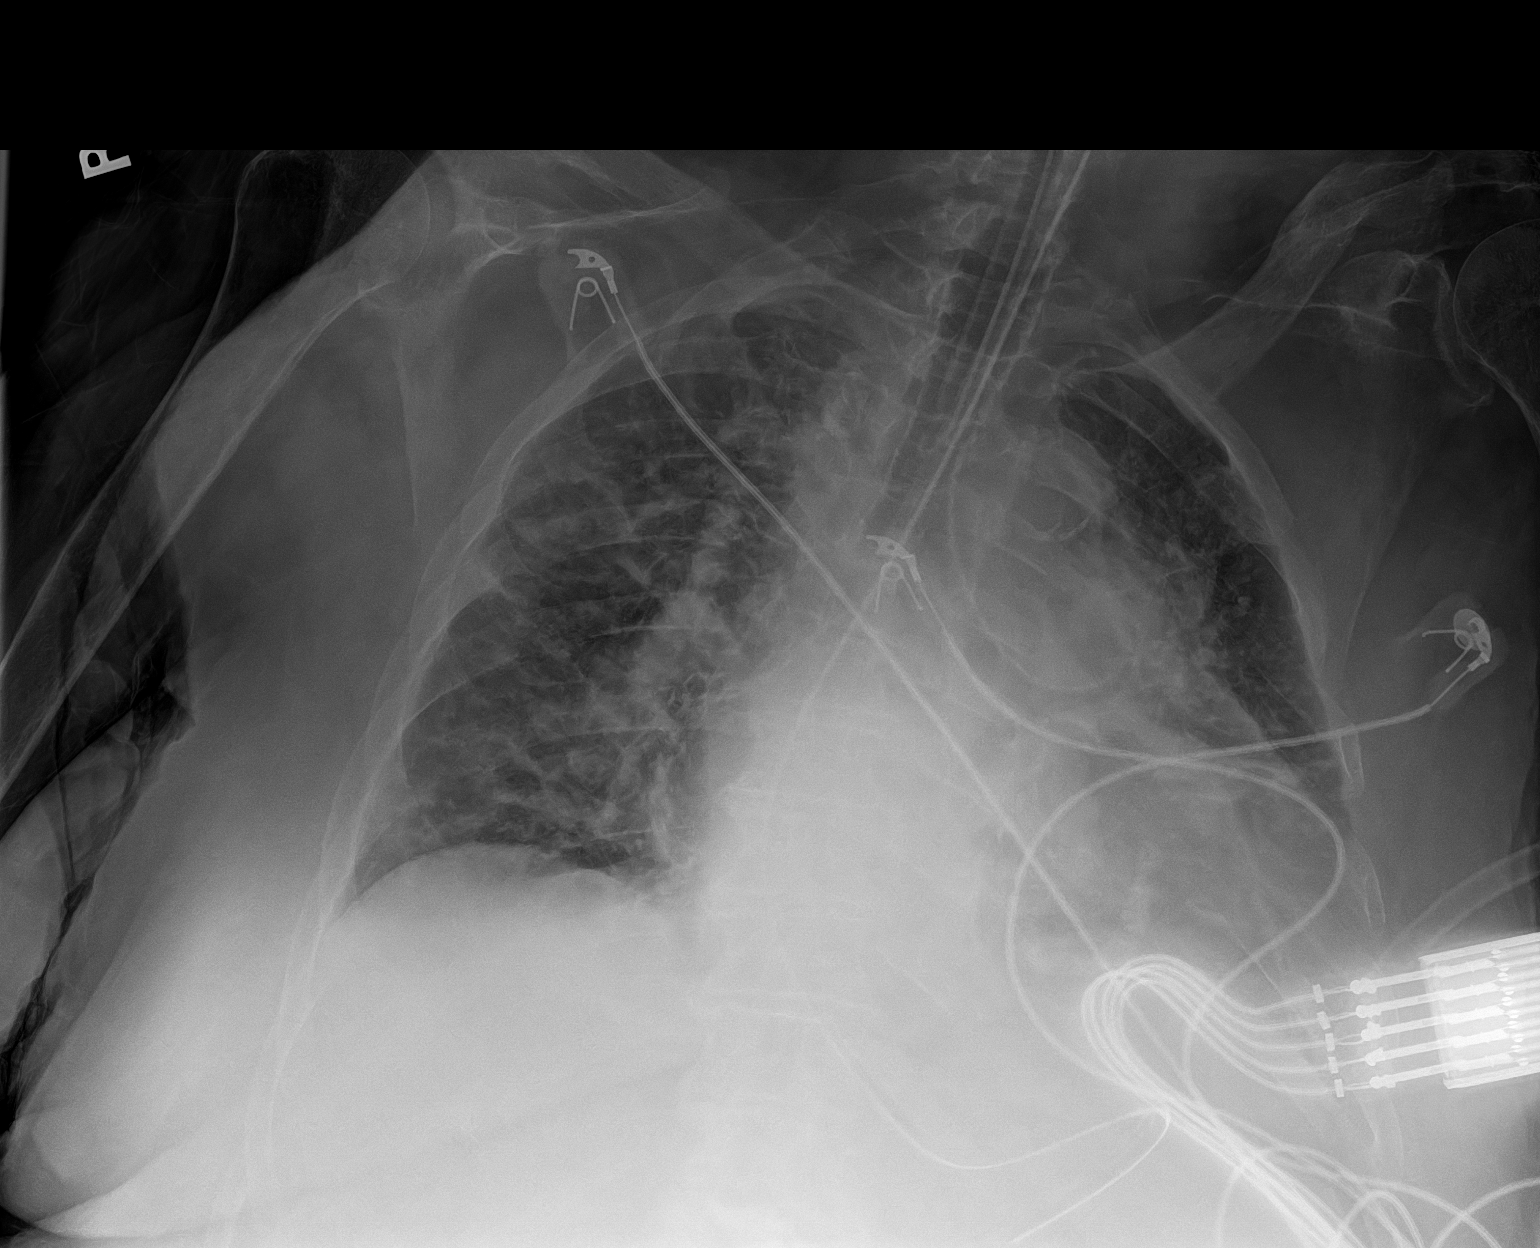

[1 of 1 positions shown; findings below may reference images not displayed]

FINDINGS: Endotracheal tube tip projects 2.4 cm above the carina.

Nasal/orogastric tube passes well below the diaphragm, curling
within the stomach, tip below the included field of view.

Cardiac silhouette is enlarged. There is persistent vascular
congestion with bilateral interstitial opacities. No new lung
abnormalities.
IMPRESSION: 1. Well-positioned endotracheal tube and nasal/orogastric tube.
2. No change in the appearance of the lungs or cardiomegaly. Suspect
congestive heart failure.

## 2023-05-08 IMAGING — DX DG CHEST 1V PORT
1 series · 1 of 1 positions shown · non-contrast
Comparison: 11/21/2020

CLINICAL DATA: Cough, shortness of breath

EXAM:
PORTABLE CHEST 1 VIEW

[chest ap]
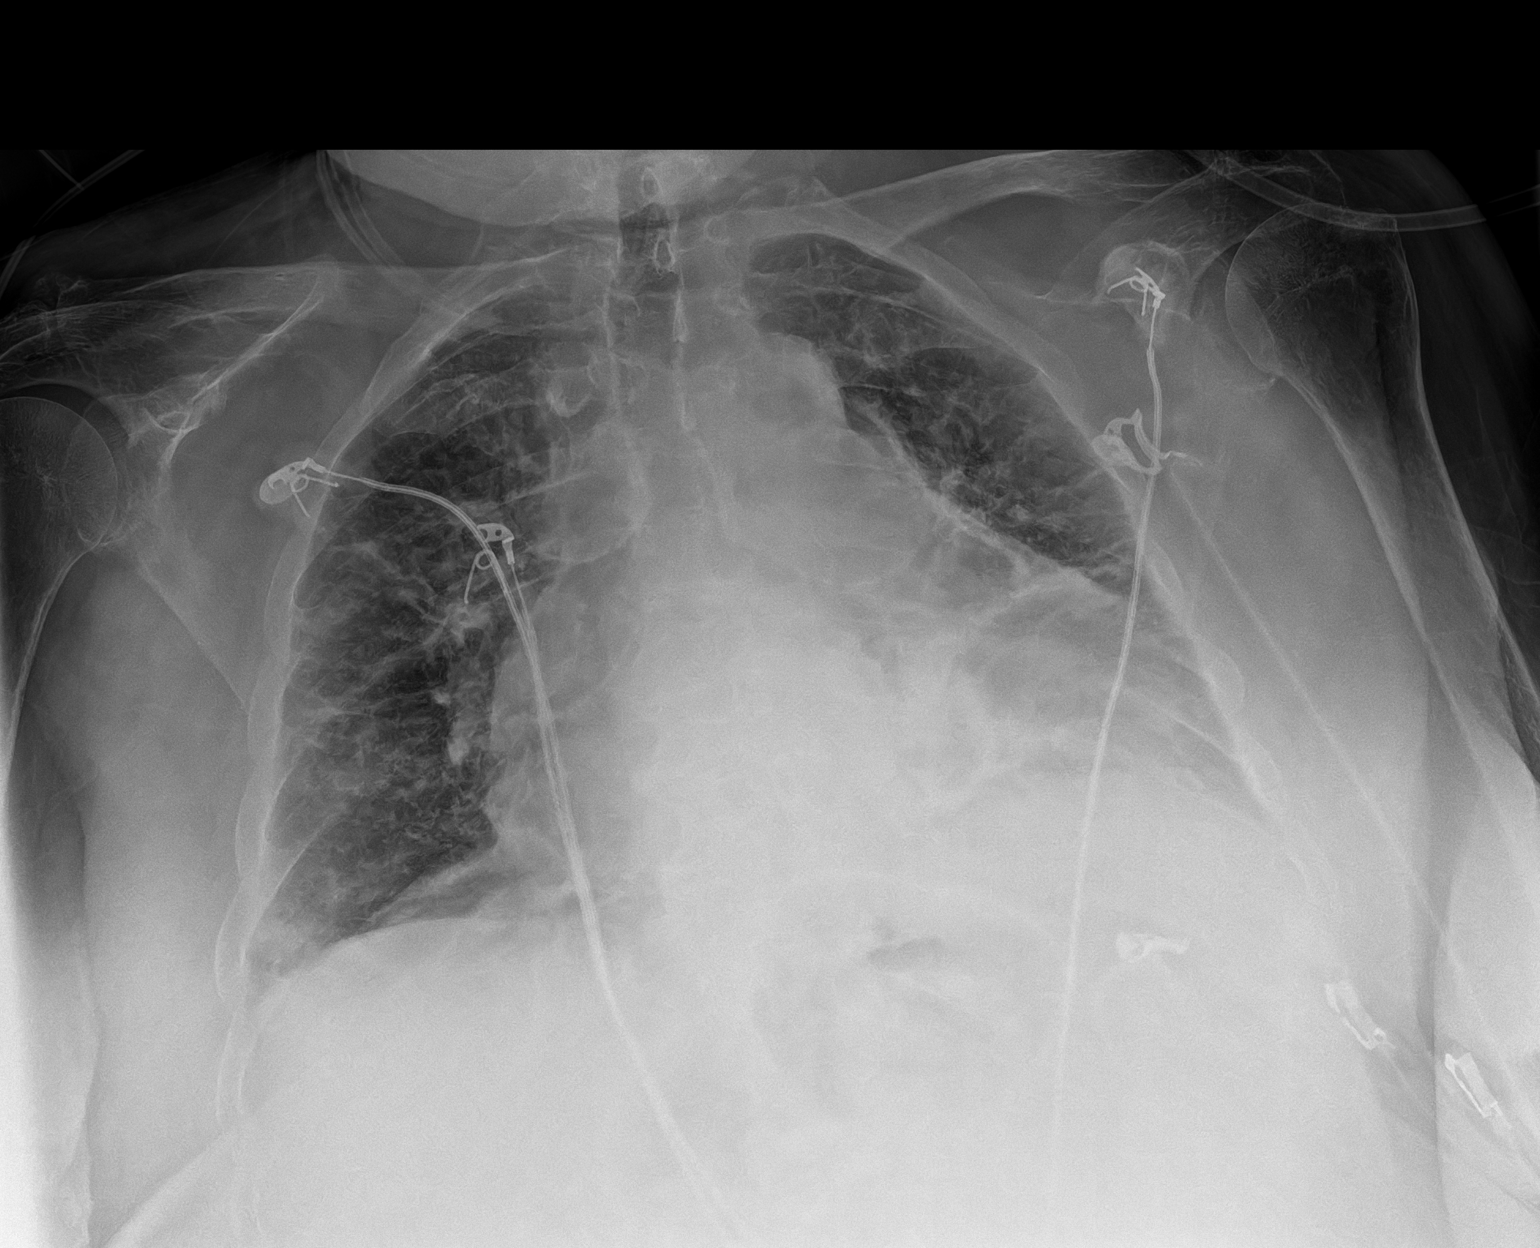

[1 of 1 positions shown; findings below may reference images not displayed]

FINDINGS: Bilateral diffuse interstitial thickening. No focal consolidation.
No pleural effusion or pneumothorax. Stable cardiomegaly.

No acute osseous abnormality.
IMPRESSION: Cardiomegaly with mild pulmonary vascular congestion.

## 2023-05-08 NOTE — Transitions of Care (Post Inpatient/ED Visit) (Signed)
   05/08/2023  Name: [name] MRN: [id number] DOB: Jan 10, 1951  Today's TOC FU Call Status: Today's TOC FU Call Status:: Unsuccessful Call (3rd Attempt) Unsuccessful Call (3rd Attempt) Date: 05/08/23  Attempted to reach the patient regarding the most recent Inpatient/ED visit.  Follow Up Plan: No further outreach attempts will be made at this time. We have been unable to contact the patient.  Lonia Chimera, RN, BSN, CEN [employer]- Transition of Care Team.  [facility] [phone]

## 2023-05-09 IMAGING — DX DG CHEST 1V PORT
1 series · 1 of 1 positions shown · non-contrast
Comparison: 12/21/2020

CLINICAL DATA: Status post PICC line placement

EXAM:
PORTABLE CHEST 1 VIEW

[chest ap]
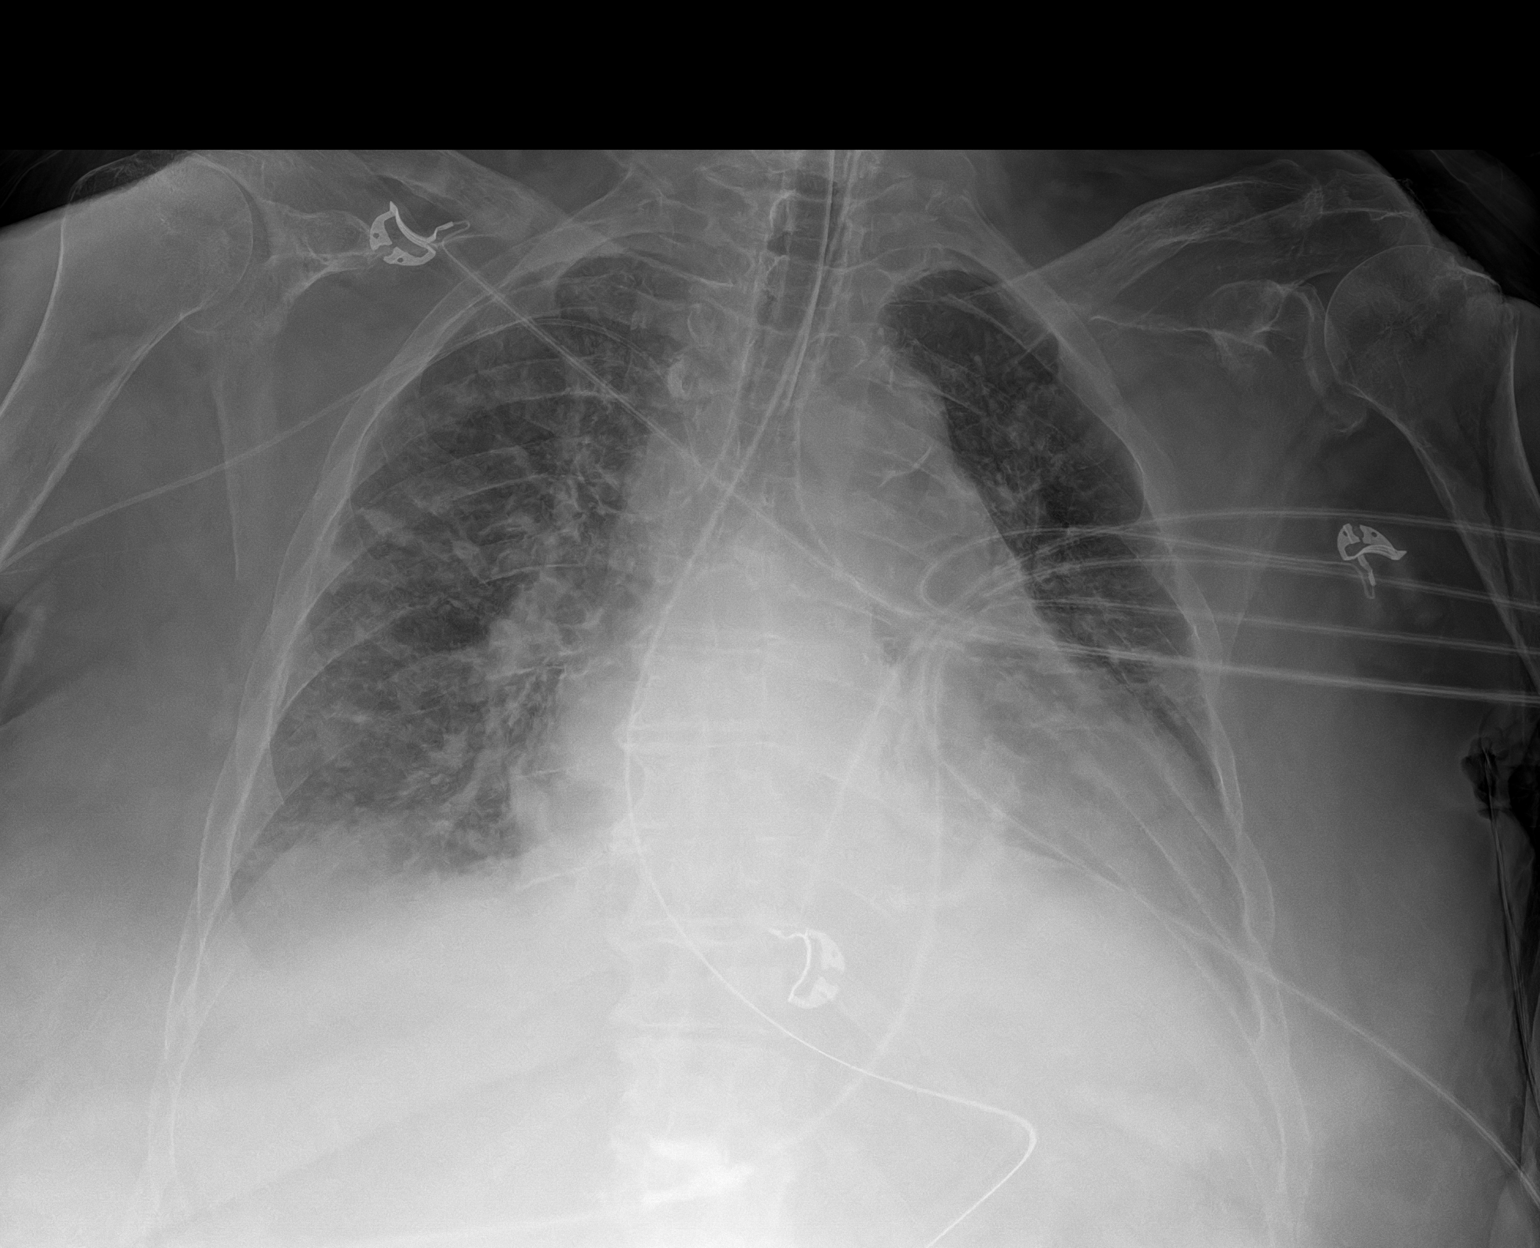

[1 of 1 positions shown; findings below may reference images not displayed]

FINDINGS: Cardiac shadow is enlarged but stable. Aortic calcifications are
again seen. Endotracheal tube and gastric catheter are again noted
and stable. New right-sided PICC line is seen with the catheter tip
in the distal superior vena cava. Patchy airspace opacities are
again identified bilaterally stable from the prior exam.
IMPRESSION: PICC line in satisfactory position.

Remainder of the exam is stable.

## 2023-05-12 ENCOUNTER — Telehealth: Payer: Self-pay | Admitting: Internal Medicine

## 2023-05-12 ENCOUNTER — Other Ambulatory Visit: Payer: Self-pay | Admitting: Family Medicine

## 2023-05-12 IMAGING — DX DG CHEST 1V PORT
1 series · 1 of 1 positions shown · non-contrast
Comparison: December 22, 2020.

CLINICAL DATA: Dyspnea.

EXAM:
PORTABLE CHEST 1 VIEW

[chest ap]
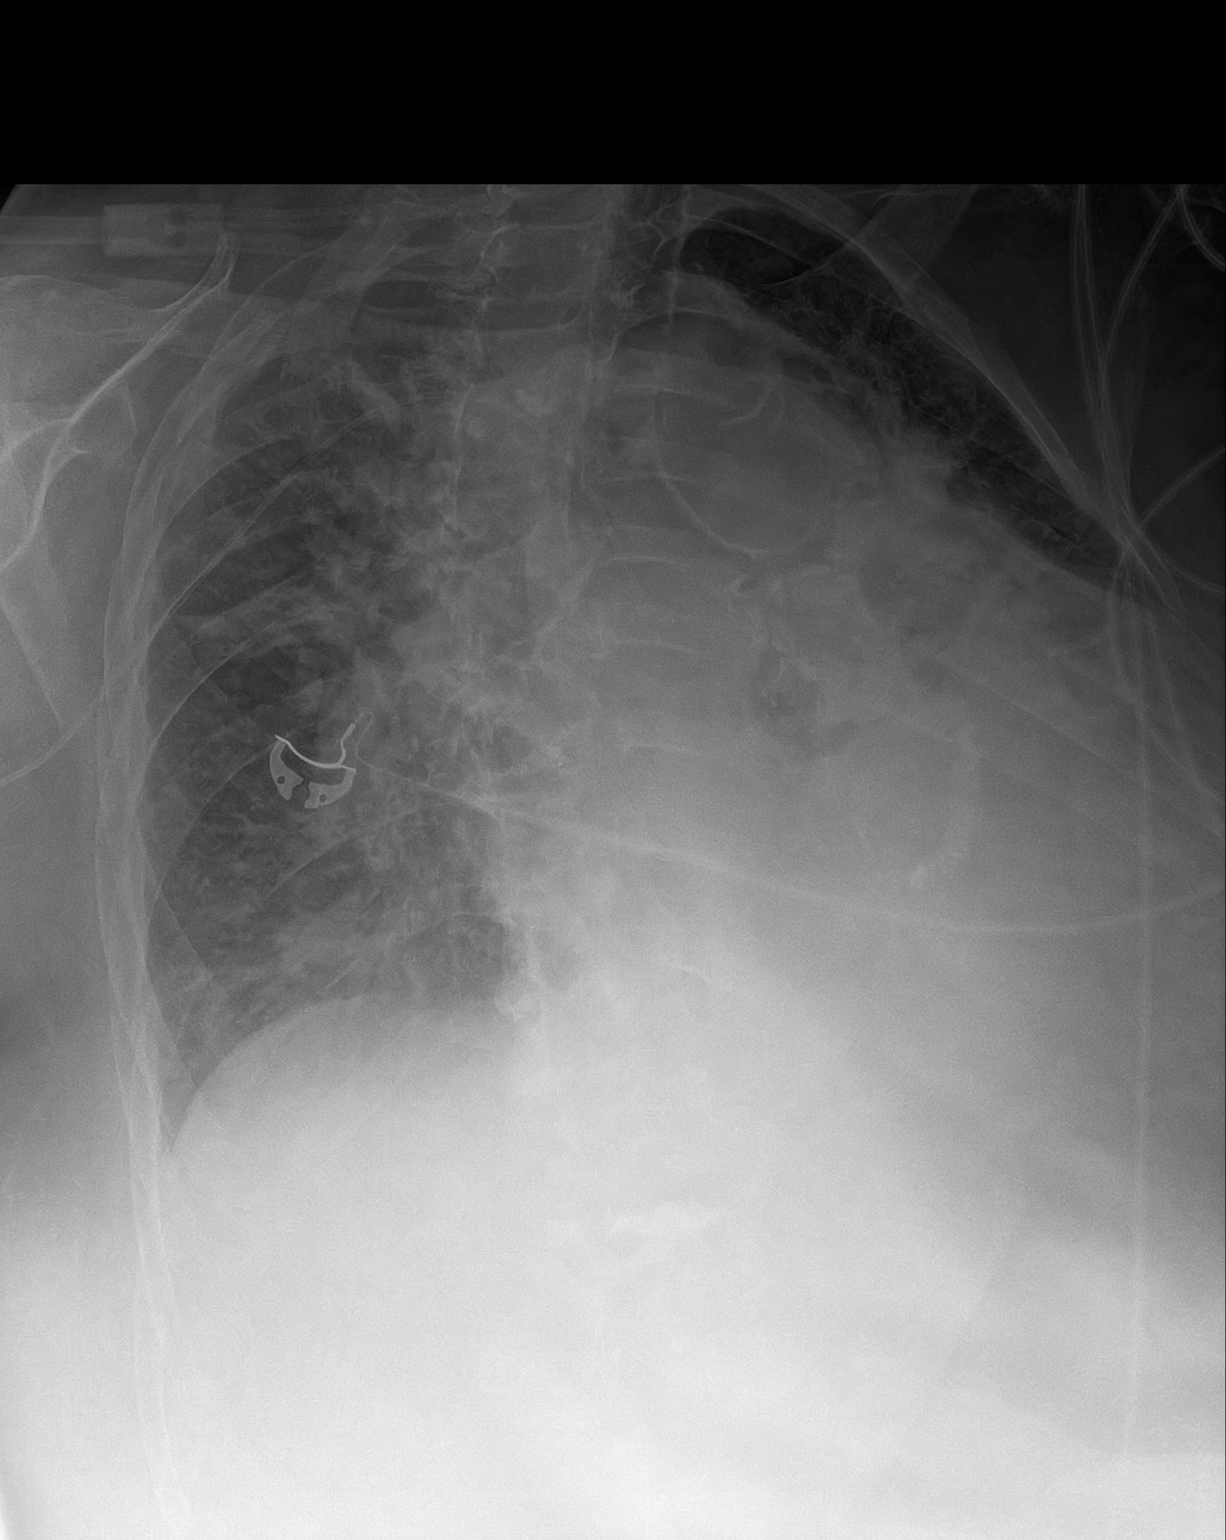

[1 of 1 positions shown; findings below may reference images not displayed]

FINDINGS: Limited evaluation due to portable technique with patient rotation.
Enlarged cardiac silhouette. Right upper extremity approach PICC
catheter with the tip projecting in the expected region of the SVC.
Calcific atherosclerosis. Pulmonary vascular congestion. Left
greater than right bibasilar opacities. No visible pneumothorax on
this limited AP supine radiograph. No visible right pleural
effusion. Limited evaluation for left pleural effusion due to
patient rotation, left basilar opacities, and the left costophrenic
sulcus being excluded from the field of view. Polyarticular
degenerative change.
IMPRESSION: 1. Limited evaluation due to portable technique with patient
rotation.
2. Cardiomegaly and central pulmonary vascular congestion.
3. Left greater than right bibasilar opacities, which could
represent atelectasis, aspiration, and/or pneumonia.

## 2023-05-12 NOTE — Telephone Encounter (Signed)
Pt last seen [facility] in July and was scheduled for her 6 month follow up with her on 11/20. This morning her daughter called to see if we could see her sooner and she didn't care who saw her as long as she could be seen sooner. I told her [facility]'s next available would be next Friday at 8am. She said that patient was noticing blood in her stools. I reached out to [facility] to see if she would be OK using one of her available slots for next week. She asked for one of the CMAs to call and get more info about bleeding. If it is a significant amount of bleeding, she needs to go to ER or if she can't wait until Friday to see [facility], then [facility] would see her as a new patient since she hasn't seen her in 2-3 years. [phone]

## 2023-05-12 NOTE — Telephone Encounter (Signed)
Spoke to pt's daughter and states OV was moved up to next Friday.

## 2023-05-13 NOTE — Procedures (Signed)
   HISTORY: 72 year old female with history of dementia and seizure  TECHNIQUE:  This is a routine 16 channel EEG recording with one channel devoted to a limited EKG recording.  It was performed during wakefulness, drowsiness and asleep.  Hyperventilation was not performed due to COPD and photic stimulation were performed as activating procedures.  There are minimum muscle and movement artifact noted.  Upon maximum arousal, posterior dominant waking rhythm consistent of mildly dysrhythmic alpha range activity with mixed theta activity. Activities are symmetric over the bilateral posterior derivations and attenuated with eye opening.  Photic stimulation did not alter the tracing.  During EEG recording, patient developed drowsiness and no deeper stage of sleep was achieved During EEG recording, there was no epileptiform discharge noted.  EKG demonstrate normal sinus rhythm.  CONCLUSION: This is mild abnormal EEG.  There is slight background slowing indicating mild bihemispheric malfunction.  Levert Feinstein, M.D. Ph.D.  [facility] [address] [place], [place] [address] Phone: [phone] Fax:      [phone]

## 2023-05-14 NOTE — Patient Instructions (Signed)
        Great to see you today.  I have refilled the medication(s) we provide.    If labs were collected, we will inform you of lab results once received either by echart message or telephone call.   - echart message- for normal results that have been seen by the patient already.   - telephone call: abnormal results or if patient has not viewed results in their echart.   - Please take medications as prescribed. - Follow up with your primary health provider if any health concerns arises. - If symptoms worsen please contact your primary care provider and/or visit the emergency department.  

## 2023-05-14 NOTE — Progress Notes (Unsigned)
   Established Patient Office Visit   Subjective  Patient ID: AOLANI WAS, female    DOB: 21-Mar-1951  Age: 72 y.o. MRN: [id number]  No chief complaint on file.   She  has a past medical history of Abnormal pulmonary function test, Anemia, Anxiety, Arthritis, Barrett's esophagus, Chest pain, Chronic anticoagulation, Chronic combined systolic and diastolic CHF (congestive heart failure) (HCC), Chronic LBP, Diabetes mellitus, type 2 (HCC), Gastroparesis, GERD (gastroesophageal reflux disease), Heart attack (HCC) (08/13/2020), Hiatal hernia, Hyperlipidemia, Hypertension, Hypothyroid, IBS (irritable bowel syndrome), Obesity, OSA on CPAP, Paroxysmal atrial fibrillation (HCC), Pulmonary hypertension (HCC) (01/2015), Seizures (HCC), and Syncope.  HPI  ROS    Objective:     There were no vitals taken for this visit. {Vitals History (Optional):23777}  Physical Exam   No results found for any visits on 05/15/23.  The ASCVD Risk score (Arnett DK, et al., 2019) failed to calculate for the following reasons:   The patient has a prior MI or stroke diagnosis    Assessment & Plan:  There are no diagnoses linked to this encounter.  No follow-ups on file.   Cruzita Lederer Newman Nip, FNP

## 2023-05-15 ENCOUNTER — Ambulatory Visit: Payer: PPO | Admitting: Family Medicine

## 2023-05-15 ENCOUNTER — Encounter: Payer: Self-pay | Admitting: Family Medicine

## 2023-05-15 VITALS — BP 110/72 | HR 71 | Ht 66.0 in | Wt 170.0 lb

## 2023-05-15 DIAGNOSIS — E039 Hypothyroidism, unspecified: Secondary | ICD-10-CM

## 2023-05-15 DIAGNOSIS — R6510 Systemic inflammatory response syndrome (SIRS) of non-infectious origin without acute organ dysfunction: Secondary | ICD-10-CM

## 2023-05-15 DIAGNOSIS — Z09 Encounter for follow-up examination after completed treatment for conditions other than malignant neoplasm: Secondary | ICD-10-CM | POA: Insufficient documentation

## 2023-05-15 NOTE — Assessment & Plan Note (Addendum)
Patient stabilized with improved WBC, electrolyte, and renal function. Her diet was advanced, tolerated well, and speech therapy recommended a dysphagia level 3 diet. Gastroenterology advised no further inpatient endoscopy. She was discharged on a 7-day course of cefdinir and metronidazole, with a follow-up GI appointment upcoming this Friday.  Patient reports feeling improved overall, with no episodes of shortness of breath and has completed the prescribed antibiotic regimen.  BMP, CBC, and thyroid panel labs ordered

## 2023-05-18 NOTE — Progress Notes (Signed)
Referring Provider: Wylene Men* Primary Care Physician:  Rica Records, FNP Primary GI Physician: Dr. Marletta Lor  Chief Complaint  Patient presents with   Follow-up    Hospital follow up. Diarrhea and blood in stool     HPI:   [name] is a 72 y.o. female CHF, restrictive lung disease, hospitalized in June 2022 with respiratory failure and required tracheostomy, IDA previously following with hematology and receiving IV iron due to oral iron intolerance, IBS with alternating constipation and diarrhea controlled outpatient with dicyclomine and metamucil, chronic lower abdominal pain worsened postprandially with negative mesenteric [place] in 2021, gastroparesis, GERD, recent hospitalization in the latter part of October for aspiration pneumonia with pneumonitis (following episode of emesis) and colitis, presenting today for hospital follow-up.  Her recent admission for aspiration pneumonia with pneumonitis and colitis was from 04/29/2023 -05/03/2023.  She initially presented with abdominal pain, nausea, vomiting, shortness of breath.  CT A/P with contrast showed long segment colonic wall thickening involving the ascending and proximal transverse colon as well as distal sigmoid colon and rectum consistent with nonspecific colitis.  C. difficile and GI pathogen panel were negative.  No reports of syncope though she did have soft blood pressures on admission. Unable to rule out ischemic event, but distribution is not entirely consistent with this. May have been missed infectious gastroenteritis. She was treated with ceftriaxone and Flagyl with improvement in abdominal pain and diarrhea.  Recommended holding off on endoscopic evaluation unless worsening diarrhea or rectal bleeding. She was discharged with cefdinir and metronidazole to complete 10 days of antibiotics.   She also had some persistent regurgitation during admission which was felt to be more related to GERD as she was  only receiving pantoprazole daily.  This was increased to twice daily as she was taking at home.    She had follow-up with PCP on 11/11 who has ordered updated blood work, but hasn't been completed yet. Patient reported clinically feeling much improved.    Today:  Reports she is feeling better overall since her hospital discharge.  Her stools are sometimes formed or loose, but not watery.  Diarrhea seems to have improved quite a bit over this last week.  Has skipped a day without a bowel movement and had a couple bowel movements yesterday.  Overall, feels that her bowels are pretty much back to baseline.  She is taking her dicyclomine 4 times a day and Imodium as needed for occasional breakthrough diarrhea.  Is only required Imodium twice this past week.  Takes 1 pill.  She did have red blood in her stool 2 days last week with 1 occurrence on 8 today.  It was only a small amount of blood in the form of the stool itself.  No blood in the toilet water.  Think she does have hemorrhoids that will sometimes cause some rectal irritation, but no pain.  No abdominal pain.  No further vomiting. A little nausea, but will take pepcid which helps.  GERD seems well-controlled overall on pantoprazole 40 mg twice daily.    Prior endoscopic evaluation:  EGD in May 2016 due to anemia and history of Barrett's without evidence of Barrett's. Last colonoscopy in May 2015 with redundant colon, moderate diverticulosis throughout the entire colon.  Capsule in April 2015 with idiopathic small bowel ulcers that have been present since 2013.  *Had been taking Aleve.    Past Medical History:  Diagnosis Date   Abnormal pulmonary function test  Anemia    H/H of 10/30 with a normal MCV in 12/09   Anxiety    Arthritis    Barrett's esophagus    Diagnosed 1995. Last EGD 2016-NO BARRETT'S.    Chest pain    a. 2022 Cath: nl cors; b. 2008 Neg myoview; c. 01/2014 Cath: Nl cors. EF 40%; c. 08/2020 NSTEMI/Cath [facility]): nl  cors.   Chronic anticoagulation    Chronic combined systolic and diastolic CHF (congestive heart failure) (HCC)    a. 10/2014 Echo: EF40%; b. 01/2015 Echo: EF 55-60%; c. 11/2020 Echo: EF 55-60%, mod LVH, sev BAE, mild MR, mod TR. PASP ; d. 01/2021 Echo: EF 45-50%, glob HK, mod conc LVH, mod reduced RV fxn, RVSP 60.31mmHg, Mod TR, mild to mod MR, sev dil LA.   Chronic LBP    Surgical intervention in 1996   Diabetes mellitus, type 2 (HCC)    Insulin therapy; exacerbated by prednisone   Gastroparesis    99% retention 05/2008 on GES   GERD (gastroesophageal reflux disease)    Heart attack (HCC) 08/13/2020   Hiatal hernia    Hyperlipidemia    Hypertension    Hypothyroid    IBS (irritable bowel syndrome)    Obesity    OSA on CPAP    had CPAP and cannot tolerate.   Paroxysmal atrial fibrillation (HCC)    a.CHA2DS2VASc = 6-->eliquis. Also on Amio.   Pulmonary hypertension (HCC) 01/2015   a. 01/2015 RHC: Predominantly pulmonary venous hypertension but may be component of PAH (PA mean 52, PCWP 31); b. 01/2021 Echo: RVSP 60.53mmHg w/ mod reduced RV fxn.   Seizures (HCC)    last seizure was 2 years ago; on keppra for this; unknown etiology   Syncope    a. Admitted 05/2009; magnetic resonance imagin/ MRA - negative; etiology thought to be orthostasis secondary to drugs and dehydration. b. Syncope 02/2015 also felt 2/2 dehydration.    Past Surgical History:  Procedure Laterality Date   BACK SURGERY  1996   BIOPSY N/A 11/08/2013   Procedure: BIOPSY  / Tissue sampling / ulcers present in small intestine;  Surgeon: West Bali, MD;  [facility]: [facility];  Service: Endoscopy;  Laterality: N/A;   CARDIAC CATHETERIZATION  2002   CARDIAC CATHETERIZATION N/A 01/26/2015   Procedure: Right Heart Cath;  Surgeon: Laurey Morale, MD;  Location: [facility] INVASIVE CV LAB;  Service: Cardiovascular;  Laterality: N/A;   CARDIOVASCULAR STRESS TEST  2008   Stress nuclear study   CARDIOVERSION N/A 03/06/2015    Procedure: CARDIOVERSION;  Surgeon: Laurey Morale, MD;  Location: [facility] ENDOSCOPY;  Service: Cardiovascular;  Laterality: N/A;   CARPAL TUNNEL RELEASE  1994   CATARACT EXTRACTION W/PHACO Right 01/10/2022   Procedure: CATARACT EXTRACTION PHACO AND INTRAOCULAR LENS PLACEMENT (IOC);  Surgeon: [name], MD;  Location: [facility] ORS;  Service: Ophthalmology;  Laterality: Right;  CDE: 21.51   CATARACT EXTRACTION W/PHACO Left 01/24/2022   Procedure: CATARACT EXTRACTION PHACO AND INTRAOCULAR LENS PLACEMENT (IOC);  Surgeon: [name], MD;  Location: [facility] ORS;  Service: Ophthalmology;  Laterality: Left;  CDE: 17.13   COLONOSCOPY  11/2011   Dr. Darrick Penna: Internal hemorrhoids, mild diverticulosis. Random colon biopsies negative.   COLONOSCOPY N/A 11/08/2013   SLF: Normal mucosa in the terminal ileum/The colon IS redundant/  Moderate diverticulosis throughout the entire colon. ileum bx benign. colon bx benign   CRYOTHERAPY  01/23/2021   Procedure: CRYOTHERAPY;  Surgeon: Lorin Glass, MD;  Location: [facility] ENDOSCOPY;  Service:  Pulmonary;;   CRYOTHERAPY  01/24/2021   Procedure: CRYOTHERAPY;  Surgeon: Lorin Glass, MD;  Location: [facility] ENDOSCOPY;  Service: Pulmonary;;   CRYOTHERAPY  01/26/2021   Procedure: Cloyde Reams;  Surgeon: Lupita Leash, MD;  Location: [facility] ENDOSCOPY;  Service: Cardiopulmonary;;   ESOPHAGOGASTRODUODENOSCOPY  2008   Barrett's without dysplasia. esphagus dilated. antral erosions, h.pylori serologies negative.   ESOPHAGOGASTRODUODENOSCOPY  11/2011   Dr. Darrick Penna: Barrett's esophagus, mild gastritis, diverticulum in the second portion of the duodenum repeat EGD 3 years. Small bowel biopsies negative. Gastric biopsy show reactive gastropathy but no H. pylori. Esophageal biopsies consistent with GERD. Next EGD 11/2014   ESOPHAGOGASTRODUODENOSCOPY N/A 11/21/2014   OZH:YQMV non-erosive gastritis/irregular z-line   ESOPHAGOGASTRODUODENOSCOPY  03/2022   FOREIGN BODY REMOVAL  01/23/2021    Procedure: FOREIGN BODY REMOVAL;  Surgeon: Lorin Glass, MD;  Location: [facility] ENDOSCOPY;  Service: Pulmonary;;   FOREIGN BODY REMOVAL  01/24/2021   Procedure: FOREIGN BODY REMOVAL;  Surgeon: Lorin Glass, MD;  Location: [facility] ENDOSCOPY;  Service: Pulmonary;;   GIVENS CAPSULE STUDY  12/07/2011   Proximal small bowel, rare AVM. Distal small bowel, multiple ulcers noted   GIVENS CAPSULE STUDY N/A 09/27/2013   Distal small bowel ulcers extending to TI.   GIVENS CAPSULE STUDY N/A 10/10/2013   Procedure: GIVENS CAPSULE STUDY;  Surgeon: West Bali, MD;  [facility]: [facility];  Service: Endoscopy;  Laterality: N/A;  7:30   HEMOSTASIS CONTROL  01/24/2021   Procedure: HEMOSTASIS CONTROL;  Surgeon: Lorin Glass, MD;  Location: [facility] ENDOSCOPY;  Service: Pulmonary;;   HEMOSTASIS CONTROL  01/26/2021   Procedure: HEMOSTASIS CONTROL;  Surgeon: Lupita Leash, MD;  Location: [facility] ENDOSCOPY;  Service: Cardiopulmonary;;   IR GASTROSTOMY TUBE MOD SED  01/11/2021   IR GASTROSTOMY TUBE REMOVAL  04/13/2021   IR KYPHO THORACIC WITH BONE BIOPSY  02/09/2018   KNEE ARTHROSCOPY WITH MEDIAL MENISECTOMY Right 06/09/2016   Procedure: KNEE ARTHROSCOPY WITH MEDIAL MENISECTOMY;  Surgeon: Vickki Hearing, MD;  Location: [facility]S;  Service: Orthopedics;  Laterality: Right;  medial and lateral menisectomy   LAMINECTOMY  1995   L4-L5   LAPAROSCOPIC CHOLECYSTECTOMY  1990s   LEFT HEART CATHETERIZATION WITH CORONARY ANGIOGRAM  01/10/2014   Procedure: LEFT HEART CATHETERIZATION WITH CORONARY ANGIOGRAM;  Surgeon: Lesleigh Noe, MD;  Location: [facility] CATH LAB;  Service: Cardiovascular;;   MICROLARYNGOSCOPY WITH LASER AND BALLOON DILATION N/A 11/17/2021   Procedure: MICROLARYNGOSCOPY WITH CO2 LASER, SUBGLOTTIC LARYNGEAL MASS BIOSPY;  Surgeon: Serena Colonel, MD;  Location: [facility] OR;  Service: ENT;  Laterality: N/A;   RIGHT HEART CATHETERIZATION N/A 01/10/2014   Procedure: RIGHT HEART CATH;  Surgeon: Lesleigh Noe, MD;   Location: [facility] CATH LAB;  Service: Cardiovascular;  Laterality: N/A;   TOTAL ABDOMINAL HYSTERECTOMY  1999   TRACHEOSTOMY REVISION N/A 07/18/2021   Procedure: TRACHEOSTOMY REVISION;  Surgeon: Suzanna Obey, MD;  Location: [facility] OR;  Service: ENT;  Laterality: N/A;   TRACHEOSTOMY TUBE PLACEMENT N/A 09/01/2017   Procedure: TRACHEOSTOMY;  Surgeon: Drema Halon, MD;  Location: [facility] OR;  Service: ENT;  Laterality: N/A;   VIDEO BRONCHOSCOPY Bilateral 01/23/2021   Procedure: VIDEO BRONCHOSCOPY WITHOUT FLUORO;  Surgeon: Lorin Glass, MD;  Location: [facility] ENDOSCOPY;  Service: Pulmonary;  Laterality: Bilateral;   VIDEO BRONCHOSCOPY Bilateral 01/24/2021   Procedure: VIDEO BRONCHOSCOPY WITHOUT FLUORO;  Surgeon: Lorin Glass, MD;  Location: [facility] ENDOSCOPY;  Service: Pulmonary;  Laterality: Bilateral;   VIDEO BRONCHOSCOPY N/A 01/26/2021  Procedure: VIDEO BRONCHOSCOPY WITHOUT FLUORO;  Surgeon: Lupita Leash, MD;  Location: [facility] ENDOSCOPY;  Service: Cardiopulmonary;  Laterality: N/A;    Current Outpatient Medications  Medication Sig Dispense Refill   acetaminophen (TYLENOL) 650 MG CR tablet Take 1,300 mg by mouth 2 (two) times daily.     atorvastatin (LIPITOR) 40 MG tablet Take 1 tablet (40 mg total) by mouth daily. 90 tablet 3   busPIRone (BUSPAR) 10 MG tablet Take 1 tablet (10 mg total) by mouth 2 (two) times daily. 60 tablet 3   cefdinir (OMNICEF) 300 MG capsule Take 1 capsule (300 mg total) by mouth every 12 (twelve) hours. 14 capsule 0   Cholecalciferol (VITAMIN D-3 PO) Take 1 tablet by mouth daily in the afternoon.     Continuous Glucose Sensor (FREESTYLE LIBRE 2 SENSOR) MISC FOR CONTINUOUS GLUCOSE MONITORING. 2 each 11   Continuous Glucose Sensor (FREESTYLE LIBRE 2 SENSOR) MISC Use daily 1 each 3   dicyclomine (BENTYL) 10 MG capsule TAKE 1 CAPSULE BY MOUTH UP TO FOUR TIMES DAILY BEFORE MEALS AND AT BEDTIME (STOP IF CONSTIPATION) (Patient taking differently: Take 10 mg by mouth 4 (four) times daily  -  before meals and at bedtime.) 120 capsule 3   donepezil (ARICEPT) 5 MG tablet TAKE 1 TABLET BY MOUTH AT BEDTIME FOR dementia 90 tablet 3   ELIQUIS 5 MG TABS tablet TAKE 1 TABLET BY MOUTH TWICE DAILY AS A BLOOD THINNER 180 tablet 3   furosemide (LASIX) 40 MG tablet TAKE 1 TABLET BY MOUTH DAILY FOR FLUID 90 tablet 3   gabapentin (NEURONTIN) 100 MG capsule TAKE ONE CAPSULE BY MOUTH THREE TIMES DAILY FOR NEUROPATHIC PAIN 270 capsule 3   glycopyrrolate (ROBINUL) 1 MG tablet Take 1 tablet (1 mg total) by mouth 2 (two) times daily. 14 tablet 0   insulin aspart (NOVOLOG FLEXPEN) 100 UNIT/ML FlexPen Inject 0-8 Units into the skin 3 (three) times daily with meals.     insulin glargine (LANTUS SOLOSTAR) 100 UNIT/ML Solostar Pen Inject 20 Units into the skin at bedtime.     ipratropium-albuterol (DUONEB) 0.5-2.5 (3) MG/3ML SOLN Take 3 mLs by nebulization every 4 (four) hours as needed (Asthma).     JARDIANCE 10 MG TABS tablet TAKE 1 TABLET BY MOUTH DAILY. 30 tablet 0   levETIRAcetam (KEPPRA) 500 MG tablet TAKE 1 TABLET BY MOUTH TWICE DAILY FOR SEIZURE PREVENTION 180 tablet 3   levothyroxine (SYNTHROID) 150 MCG tablet Take 1 tablet (150 mcg total) by mouth daily before breakfast. 30 tablet 1   loperamide (IMODIUM) 2 MG capsule Take 2 mg by mouth every 8 (eight) hours as needed for diarrhea or loose stools.     LORazepam (ATIVAN) 1 MG tablet TAKE 1/2 TO 1 TABLET BY MOUTH AT BEDTIME FOR SLEEP (Patient taking differently: Take 1 mg by mouth at bedtime.) 30 tablet 2   metoprolol tartrate (LOPRESSOR) 25 MG tablet TAKE 1 TABLET BY MOUTH TWICE DAILY FOR HIGH BLOOD PRESSURE OR HEART RATE CONTROL 180 tablet 3   mupirocin ointment (BACTROBAN) 2 % Apply 1 Application topically 2 (two) times daily. 30 g 1   nitroGLYCERIN (NITROSTAT) 0.4 MG SL tablet Place 0.4 mg under the tongue every 5 (five) minutes as needed for chest pain.     pantoprazole (PROTONIX) 40 MG tablet TAKE 1 TABLET BY MOUTH TWICE DAILY FOR acid reflux 180  tablet 3   potassium chloride (KLOR-CON M) 10 MEQ tablet Take 20 mEq by mouth daily.     potassium  chloride (KLOR-CON) 10 MEQ tablet Take 1 tablet (10 mEq total) by mouth once for 1 dose. (Patient taking differently: Take 20 mEq by mouth daily.) 90 tablet 1   sertraline (ZOLOFT) 25 MG tablet TAKE 1 TABLET BY MOUTH DAILY FOR depression 90 tablet 3   traMADol (ULTRAM) 50 MG tablet Take 1 tablet (50 mg total) by mouth 3 (three) times daily as needed for moderate pain or severe pain. (Patient taking differently: Take 50 mg by mouth 3 (three) times daily.) 90 tablet 0   trolamine salicylate (ASPERCREME) 10 % cream Apply 1 application. topically as needed (knee pain).     No current facility-administered medications for this visit.    Allergies as of 05/19/2023 - Review Complete 05/19/2023  Allergen Reaction Noted   Codeine Nausea And Vomiting and Other (See Comments) 11/28/2019   Dilaudid [hydromorphone hcl] Other (See Comments) 12/31/2015   Reglan [metoclopramide] Other (See Comments) 06/04/2014   Levsin [hyoscyamine] Diarrhea 09/05/2019    Family History  Problem Relation Age of Onset   Hypertension Mother    Alzheimer's disease Mother    Stroke Mother    Heart attack Mother    Breast cancer Sister    Hypertension Other    Heart disease Neg Hx    Colon cancer Neg Hx    Liver disease Neg Hx     Social History   Socioeconomic History   Marital status: Married    Spouse name: Not on file   Number of children: Not on file   Years of education: Not on file   Highest education level: Not on file  Occupational History   Occupation: Passenger transport manager at [employer]    Employer: [employer]    Comment: disabled    Employer: RETIRED  Tobacco Use   Smoking status: Former    Current packs/day: 0.00    Average packs/day: 0.3 packs/day for 27.3 years (6.8 ttl pk-yrs)    Types: Cigarettes    Start date: 02/26/1966    Quit date: 06/30/1993    Years since quitting: 29.9   Smokeless tobacco: Never    Tobacco comments:    quit in 1984  Vaping Use   Vaping status: Never Used  Substance and Sexual Activity   Alcohol use: Not Currently    Comment: Occasional alcohol 30 years ago (09/02/21)   Drug use: No   Sexual activity: Yes    Birth control/protection: None, Surgical  Other Topics Concern   Not on file  Social History Narrative   Sedentary   4 children, "blended family"   Social Determinants of Health   Financial Resource Strain: Not on file  Food Insecurity: No Food Insecurity (04/30/2023)   Hunger Vital Sign    Worried About Running Out of Food in the Last Year: Never true    Ran Out of Food in the Last Year: Never true  Transportation Needs: No Transportation Needs (04/30/2023)   PRAPARE - Administrator, Civil Service (Medical): No    Lack of Transportation (Non-Medical): No  Physical Activity: Not on file  Stress: Not on file  Social Connections: Not on file    Review of Systems: Gen: Denies fever, chills, cold or flulike symptoms, presyncope, syncope. CV: Denies chest pain, palpitations. Resp: Has no chronic shortness of breath and cough.  Feels her breathing is almost back to baseline. GI: See HPI Heme: See HPI  Physical Exam: BP (!) 111/57 (BP Location: Right Arm, Patient Position: Sitting, Cuff Size: Normal)   Pulse  63   Temp 97.9 F (36.6 C) (Temporal)   Ht 5\' 3"  (1.6 m)   Wt 173 lb 9.6 oz (78.7 kg)   BMI 30.75 kg/m  General:   Alert and oriented. No distress noted. Pleasant and cooperative.  Frail, chronically ill-appearing.  Has tracheostomy.  Head:  Normocephalic and atraumatic. Eyes:  Conjuctiva clear without scleral icterus. Heart:  S1, S2 present without murmurs appreciated. Lungs: Slight crackles appreciated in the right lower lung base.  No wheezing or rhonchi.   No distress.  Abdomen:  +BS, soft, non-tender and non-distended. No rebound or guarding. No HSM or masses noted. Msk:  Symmetrical without gross deformities. Normal  posture. Extremities:  Without edema. Neurologic:  Alert and  oriented x4 Psych:  Normal mood and affect.    Assessment:  72 y.o. female CHF, pulmonary HTN, atrial fibrillation on Eliquis, HTN, HLD, restrictive lung disease, hospitalized in June 2022 with respiratory failure and required tracheostomy, IDA previously following with hematology and receiving IV iron due to oral iron intolerance, IBS with alternating constipation and diarrhea, chronic lower abdominal pain worsened postprandially with negative mesenteric [place] in 2021, gastroparesis, GERD, recent hospitalization in the latter part of October for aspiration pneumonia with pneumonitis (following episode of emesis) and colitis, presenting today for hospital follow-up.   History of colitis:  CT A/P with contrast showed long segment colonic wall thickening involving the ascending and proximal transverse colon as well as distal sigmoid colon and rectum consistent with nonspecific colitis.  C. difficile and GI pathogen panel negative.  No reports of syncope though she did have soft blood pressures on admission. Unable to rule out ischemic event, but distribution is not entirely consistent with this. May have been missed infectious gastroenteritis. She was treated with ceftriaxone and Flagyl with improvement in abdominal pain and diarrhea. Reports bowels are essentially back to baseline. No recommendations for colonoscopy in patient due to clinical improvement and patient being high risk. We discussed again today and patient prefers to hold off.   Rectal bleeding: 2 episodes of low-volume red blood in stool last week.  Etiology unclear.  Could be from benign anorectal source such as hemorrhoids as patient does report history of hemorrhoids, but unable to rule out other etiologies including bleeding related to recent colitis episode versus colon polyps versus malignancy.  Her last colonoscopy was in May 2015 with redundant colon, moderate  diverticulosis. Discussed the possibility of a colonoscopy with patient today, but she prefers to hold off on this and monitor for now.  I feel this is reasonable considering she is high risk for procedures.  Her PCP recently ordered labs on 11/11 which patient will plan to complete today.  Will follow-up on these to ensure no significant decline in hemoglobin.  IBS-D: At baseline.  Fairly well-controlled with dicyclomine 4 times daily and Imodium as needed.  Recommended starting Benefiber daily to help with stool form.   GERD:  Typical symptoms are well-controlled on pantoprazole 40 mg twice daily.  Nausea without vomiting:  May be related to GERD vs gastroparesis. Notes improvement with pepcid prn.    Plan:  Complete labs as ordered by PCP.  We will follow-up on these. Continue dicyclomine 4 times daily. Continue Imodium as needed. Start Benefiber 2 tablets in the morning x 2 weeks, then increase to twice daily. Patient will monitor for recurrent rectal bleeding and let us know if this occurs. May use Preparation H twice daily for 5-7 days as needed for hemorrhoids. Continue pantoprazole 40  mg twice daily. Continue Pepcid as needed for breakthrough nausea or heartburn. 4-6 small meals daily. Low fat/low fiber diet. Follow-up in 3 months or sooner if needed.   Ermalinda Memos, PA-C [facility] 05/19/2023

## 2023-05-19 ENCOUNTER — Encounter: Payer: Self-pay | Admitting: Gastroenterology

## 2023-05-19 ENCOUNTER — Ambulatory Visit: Payer: PPO | Admitting: Gastroenterology

## 2023-05-19 VITALS — BP 111/57 | HR 63 | Temp 97.9°F | Ht 63.0 in | Wt 173.6 lb

## 2023-05-19 DIAGNOSIS — K219 Gastro-esophageal reflux disease without esophagitis: Secondary | ICD-10-CM | POA: Diagnosis not present

## 2023-05-19 DIAGNOSIS — K625 Hemorrhage of anus and rectum: Secondary | ICD-10-CM | POA: Diagnosis not present

## 2023-05-19 DIAGNOSIS — K580 Irritable bowel syndrome with diarrhea: Secondary | ICD-10-CM

## 2023-05-19 DIAGNOSIS — R110 Nausea: Secondary | ICD-10-CM | POA: Diagnosis not present

## 2023-05-19 DIAGNOSIS — Z8719 Personal history of other diseases of the digestive system: Secondary | ICD-10-CM

## 2023-05-19 NOTE — Patient Instructions (Signed)
I would recommend that you go ahead and have your blood work completed that your primary care provider ordered.  Continue dicyclomine up to 4 times a day.   Continue to use Imodium as needed for breakthrough diarrhea.  Start Benefiber 2 tablets in the morning for 2 weeks, then increase to twice daily.  Will see if this will also help with forming up your stool.  Monitor for recurrent rectal bleeding and let me know if this occurs.  If you feel that your hemorrhoids are swollen or irritated, you can use Preparation H twice daily for 5-7 days at a time.  Continue pantoprazole 40 mg twice daily 30 minutes before breakfast and dinner.  You may continue to use Pepcid as needed for breakthrough heartburn or nausea as this is working well for you.  Gastroparesis recommendations:  4-6 small meals daily Low fat diet Low fiber diet (avoid raw fruits and vegetables).   It was great to see you again today!  I am glad you are feeling improved overall!  I will plan to see you back in 3 months or sooner if needed.  I have you have a fantastic Thanksgiving and Christmas!   Ermalinda Memos, PA-C [facility]

## 2023-05-21 IMAGING — CT CT HEAD W/O CM
2 series · 15 of 40 positions shown, 18 images · non-contrast
Comparison: MRI brain dated 11/23/2020

CLINICAL DATA: Altered mental status

EXAM:
CT HEAD WITHOUT CONTRAST
TECHNIQUE: Contiguous axial images were obtained from the base of the skull
through the vertex without intravenous contrast.

[Series 4: head 2.0 h70h · axial · 0.44mm/px · z∈[-135,+15]mm · 12 of 87 slices shown, 15 images]
[im 6/87  brain]
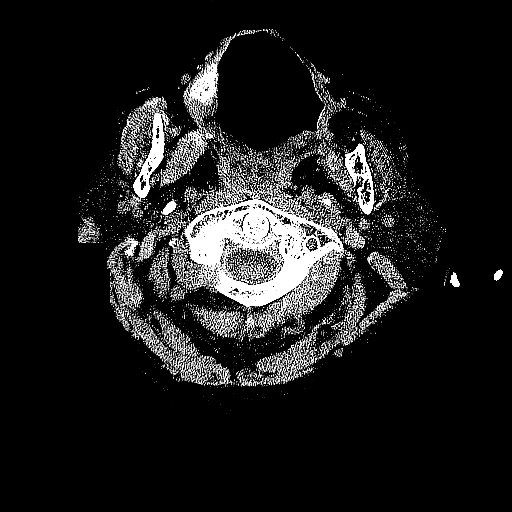
[im 6/87  bone]
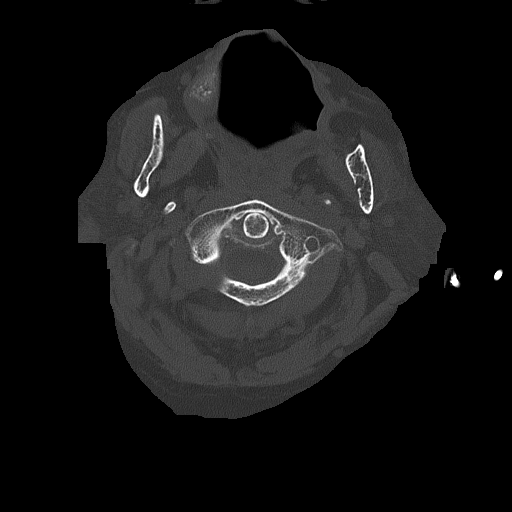
[im 12/87  brain]
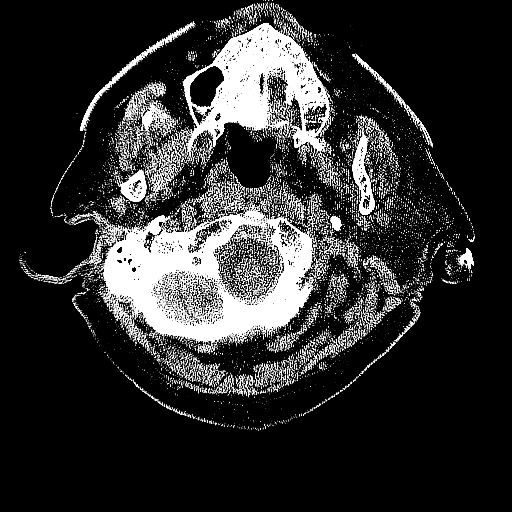
[im 18/87  brain]
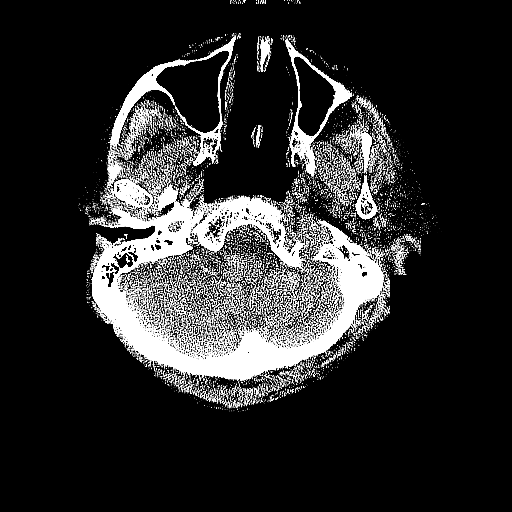
[im 27/87  brain]
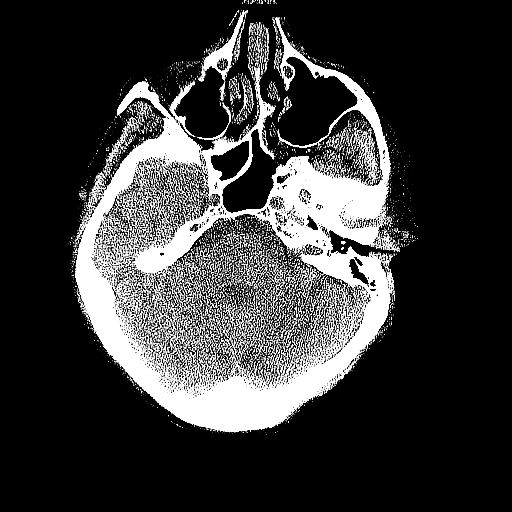
[im 33/87  brain]
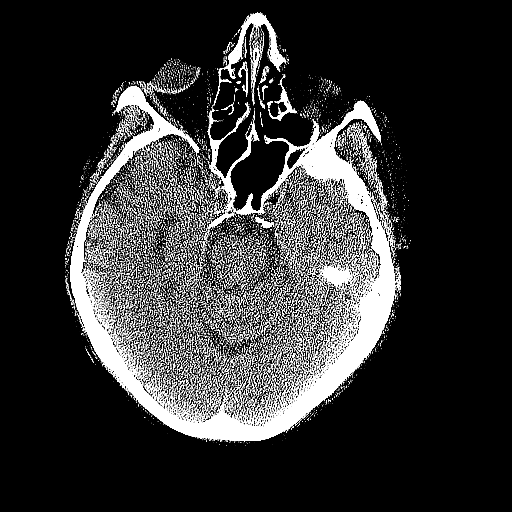
[im 33/87  bone]
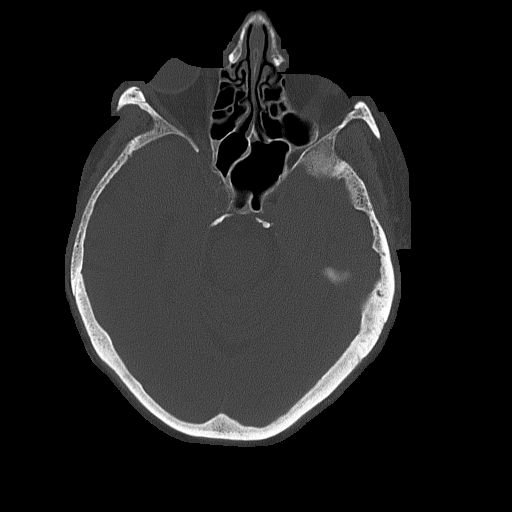
[im 39/87  brain]
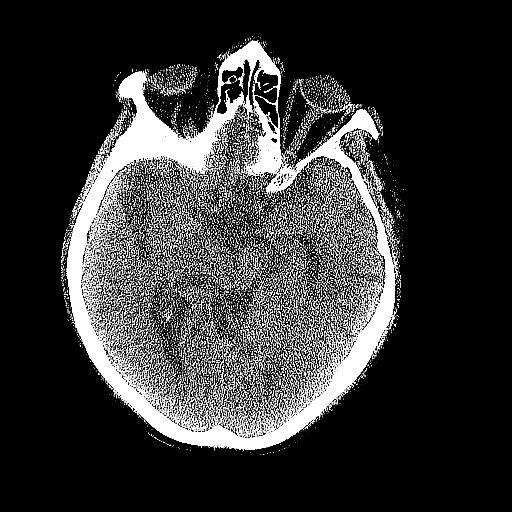
[im 48/87  brain]
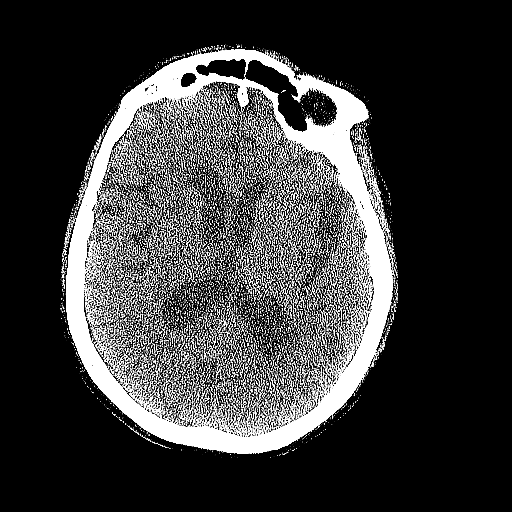
[im 54/87  brain]
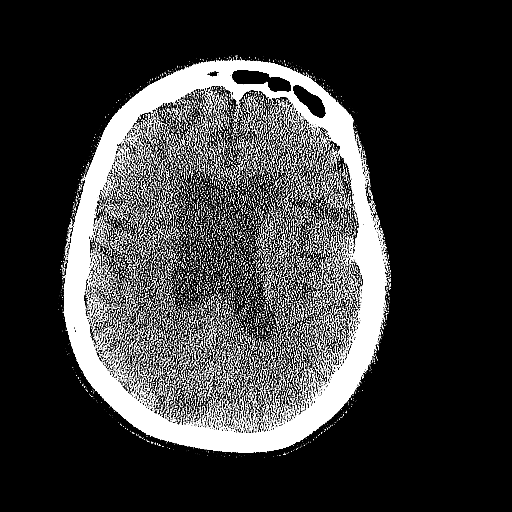
[im 60/87  brain]
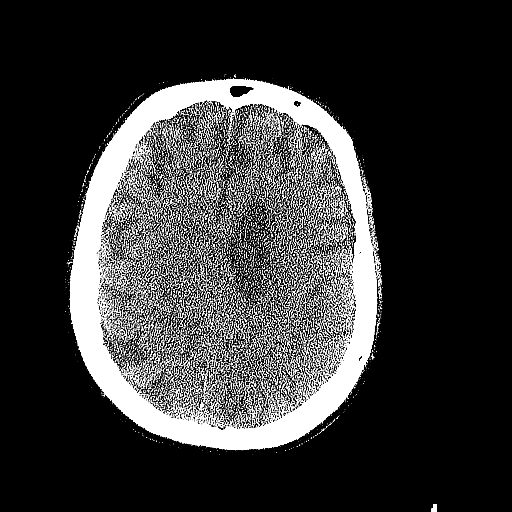
[im 60/87  bone]
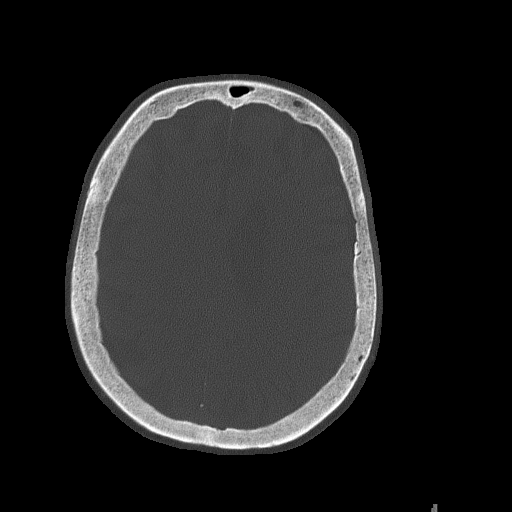
[im 69/87  brain]
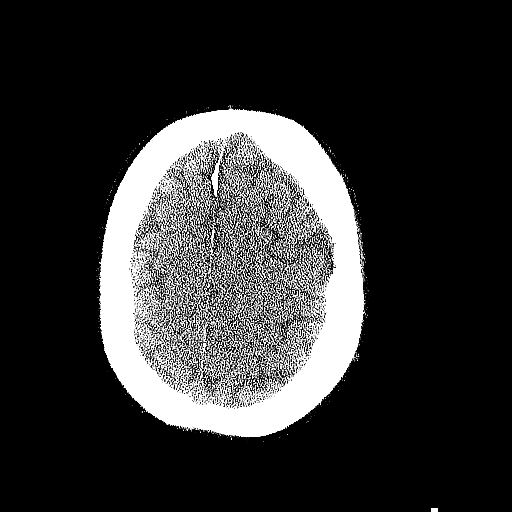
[im 75/87  brain]
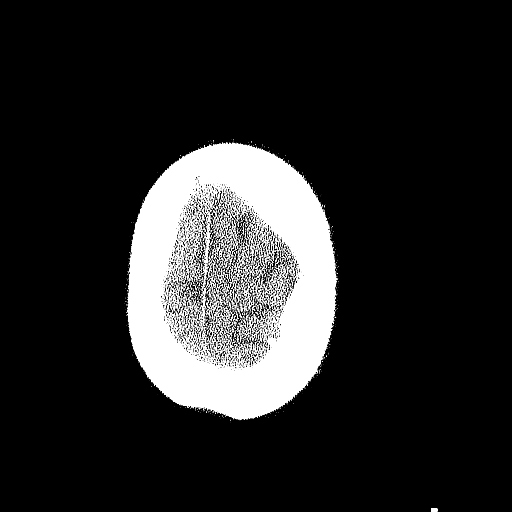
[im 81/87  brain]
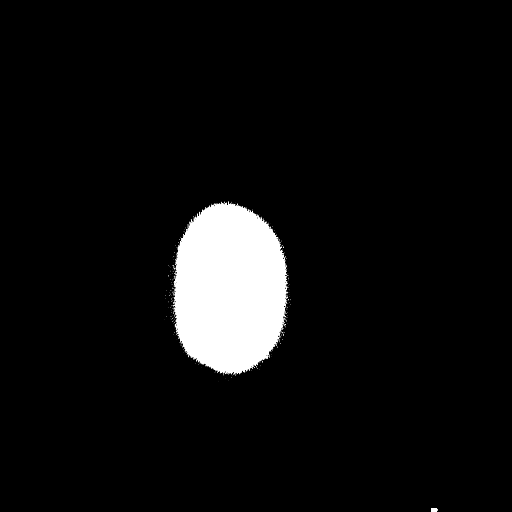

[Series 5: head 3.0 mpr cor · coronal · 0.34mm/px · 3 of 67 slices shown]
[im 25/67  brain]
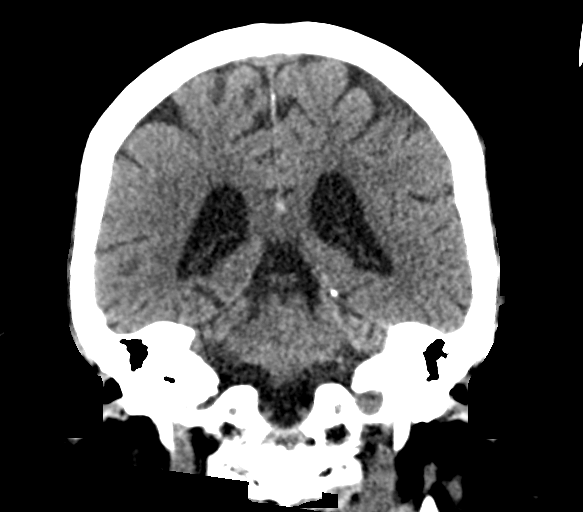
[im 31/67  brain]
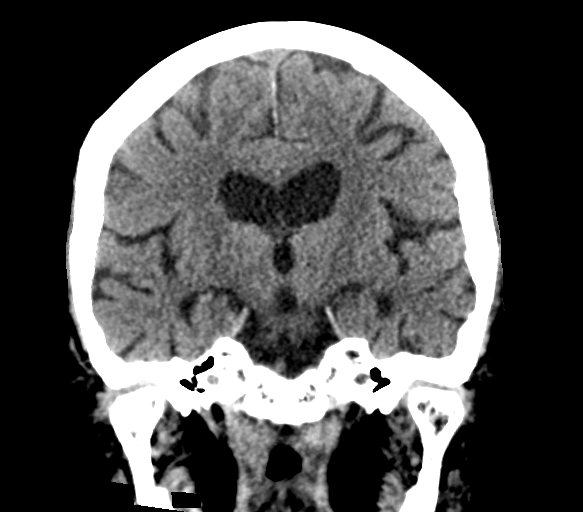
[im 36/67  brain]
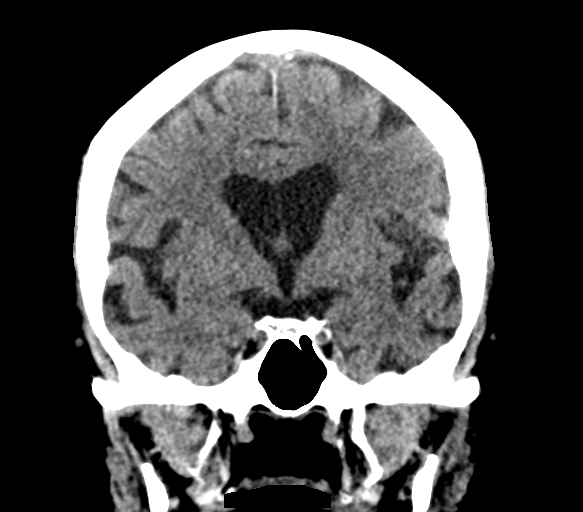

[15 of 40 positions shown; findings below may reference images not displayed]

FINDINGS: Brain: No evidence of acute infarction, hemorrhage, hydrocephalus,
extra-axial collection or mass lesion/mass effect.

Mild cortical and central atrophy. Subcortical white matter and
periventricular small vessel ischemic changes.

Vascular: No hyperdense vessel or unexpected calcification.

Skull: Normal. Negative for fracture or focal lesion.

Sinuses/Orbits: The visualized paranasal sinuses are essentially
clear. The mastoid air cells are unopacified.

Other: None.
IMPRESSION: No evidence of acute intracranial abnormality.

Mild atrophy with small vessel ischemic changes.

## 2023-05-21 IMAGING — DX DG CHEST 1V PORT
1 series · 1 of 1 positions shown · non-contrast
Comparison: 01/03/2021

CLINICAL DATA: Post intubation.

EXAM:
PORTABLE CHEST 1 VIEW

[chest ap]
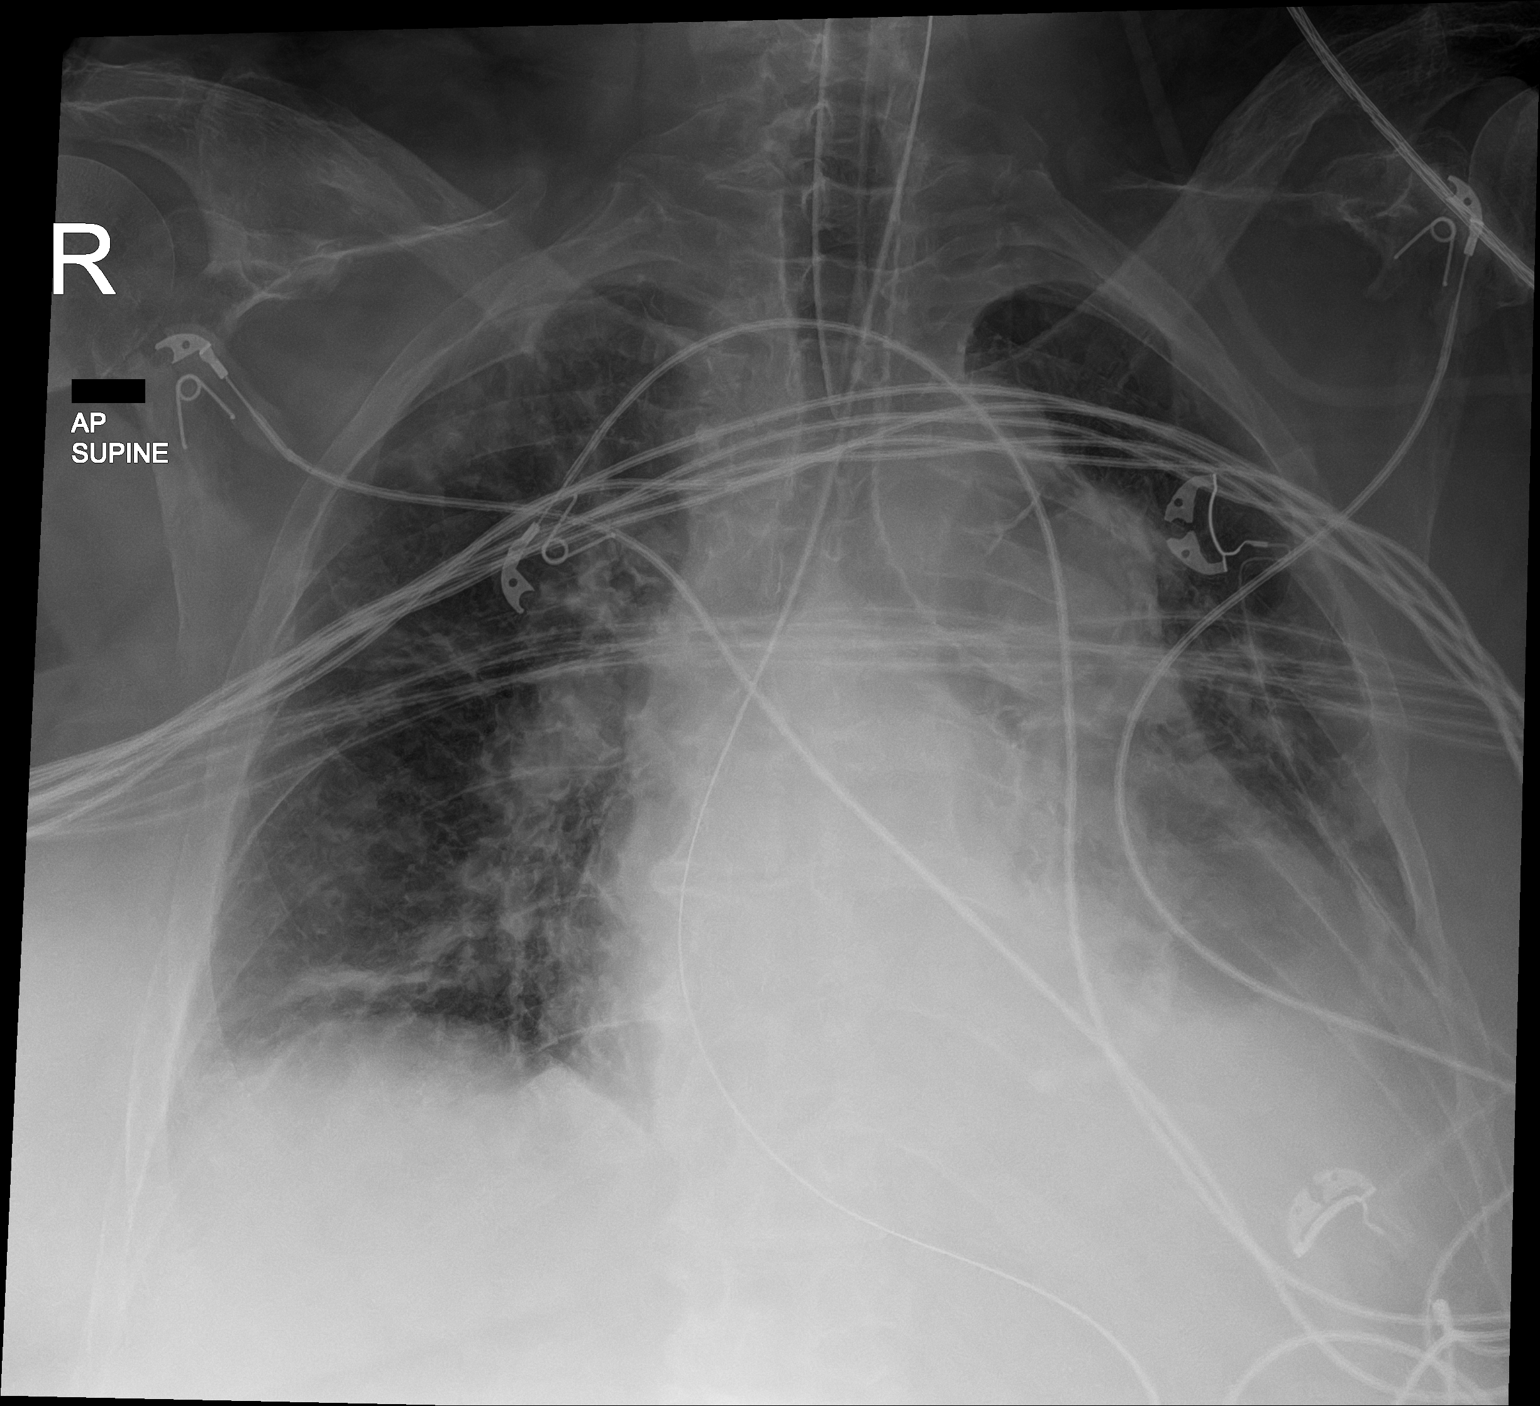

[1 of 1 positions shown; findings below may reference images not displayed]

FINDINGS: An endotracheal tube has been placed with tip measuring 3.4 cm above
the carina. An enteric tube has been placed. Tip is off the field of
view but below the left hemidiaphragm. Cardiac enlargement. Linear
atelectasis or consolidation in the mid and lower lungs. Probable
left pleural effusion. No pneumothorax. Calcified aorta. Prominence
of the left pulmonary outflow tract.
IMPRESSION: Appliances appear in satisfactory position. Persistent finding of
cardiac enlargement. Atelectasis in the lung bases. Left pleural
effusion.

## 2023-05-21 IMAGING — CT CT CHEST W/O CM
2 of 4 series · 15 of 36 positions shown, 18 images · non-contrast
Comparison: Chest radiograph dated 01/03/2021. CT chest dated
11/22/2020

CLINICAL DATA: Respiratory failure/hypoxia, intubated

EXAM:
CT CHEST WITHOUT CONTRAST
TECHNIQUE: Multidetector CT imaging of the chest was performed following the
standard protocol without IV contrast.

[Series 4: thorax 2.0 · axial · 0.92mm/px · z∈[-522,-236]mm · 12 of 161 slices shown, 15 images]
[im 9/161  mediastinal]
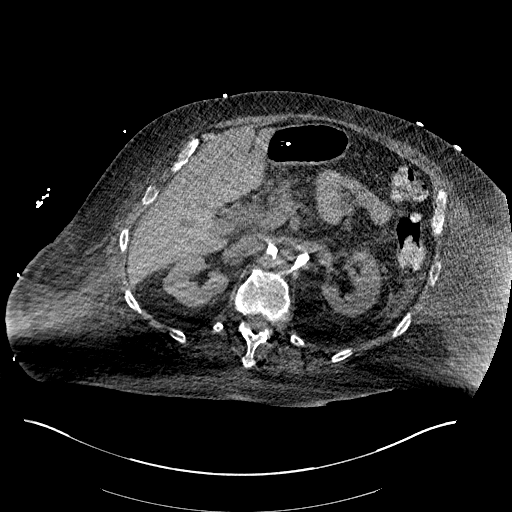
[im 9/161  lung]
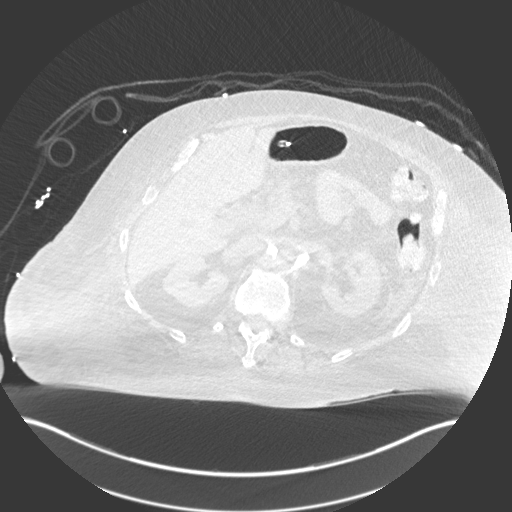
[im 26/161  lung]
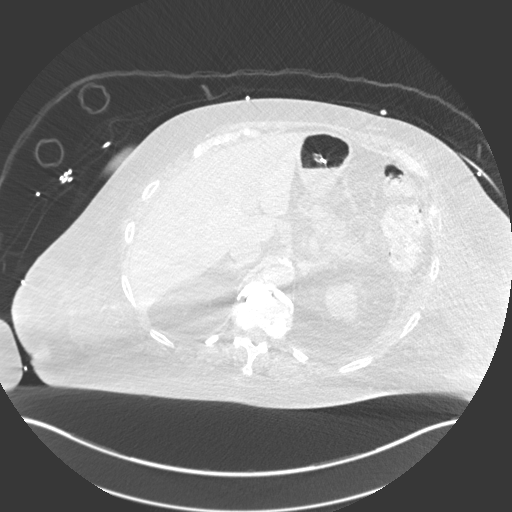
[im 34/161  lung]
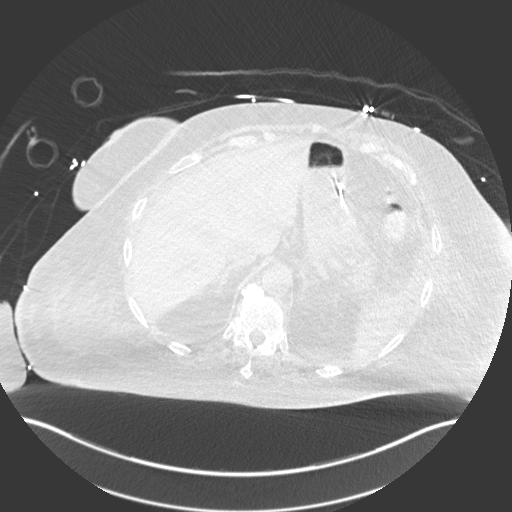
[im 51/161  lung]
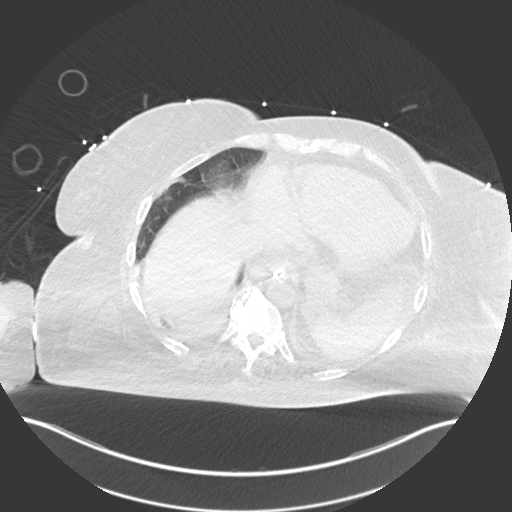
[im 59/161  mediastinal]
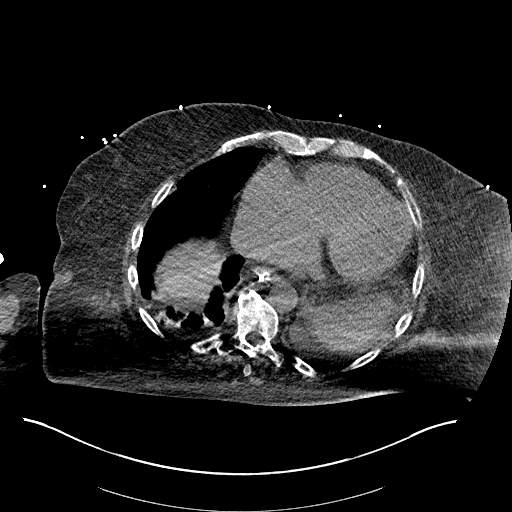
[im 59/161  lung]
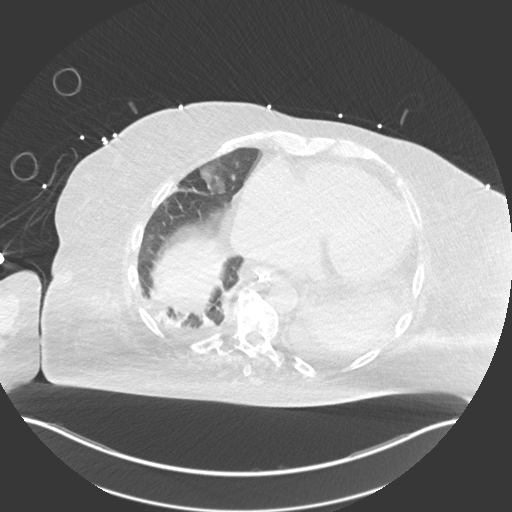
[im 76/161  lung]
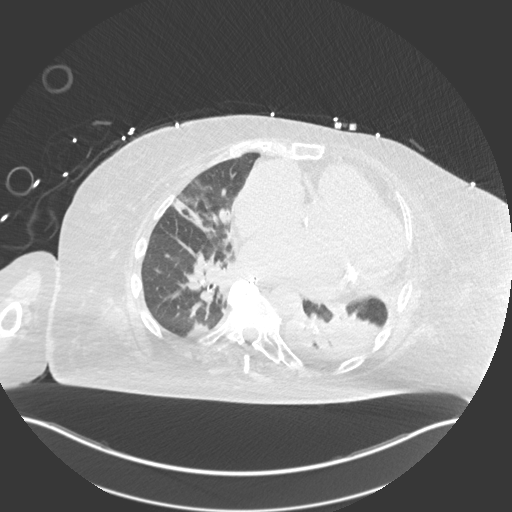
[im 85/161  lung]
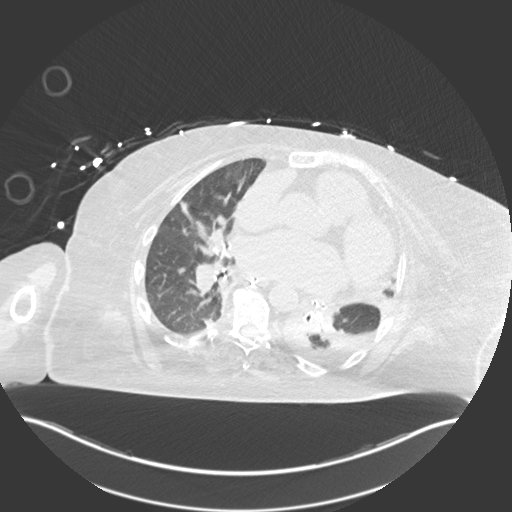
[im 102/161  lung]
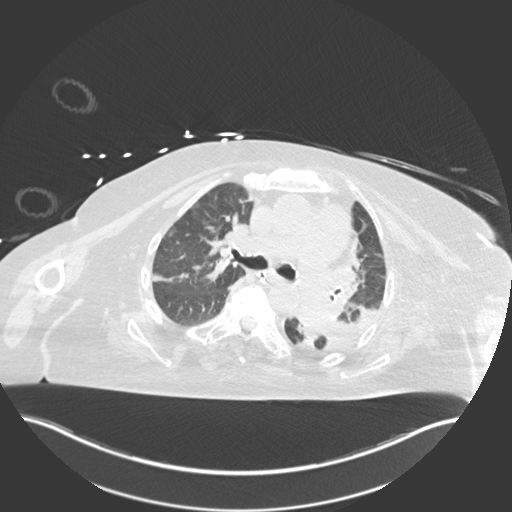
[im 110/161  mediastinal]
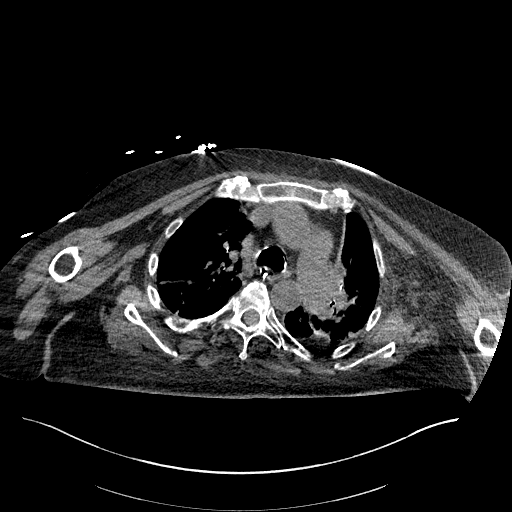
[im 110/161  lung]
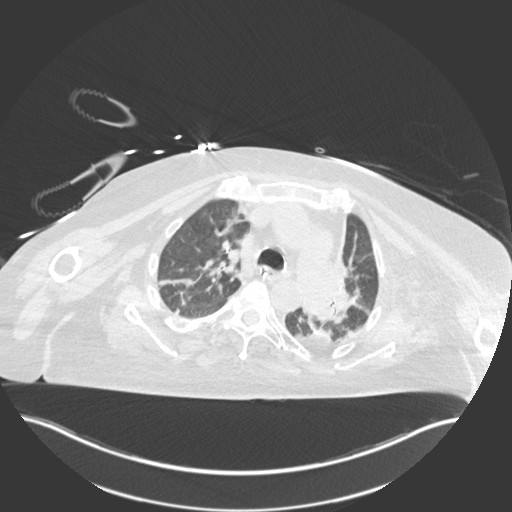
[im 127/161  lung]
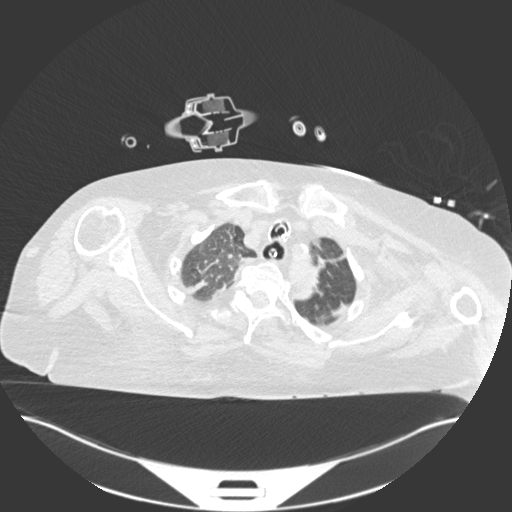
[im 135/161  lung]
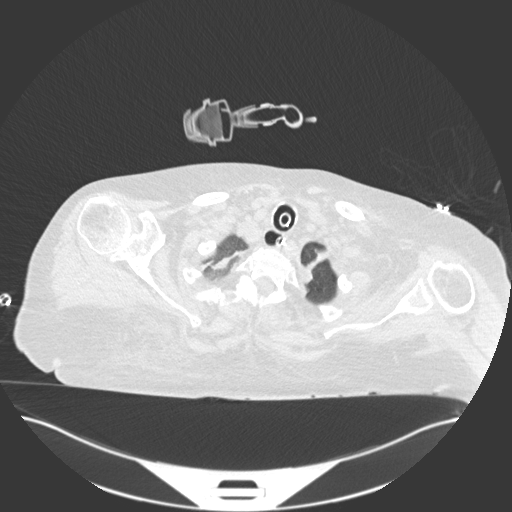
[im 152/161  lung]
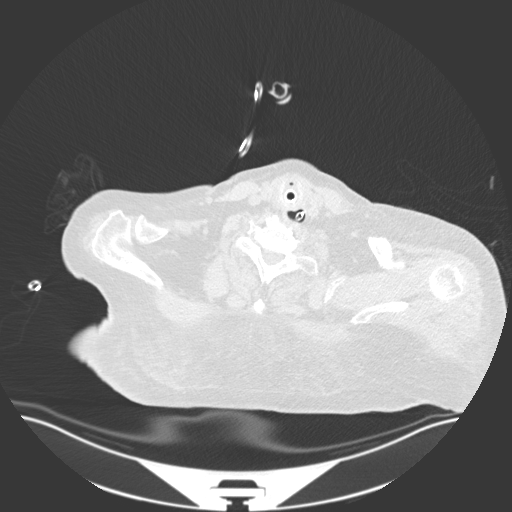

[Series 6: coronal · coronal · 0.63mm/px · 3 of 101 slices shown]
[im 21/101  lung]
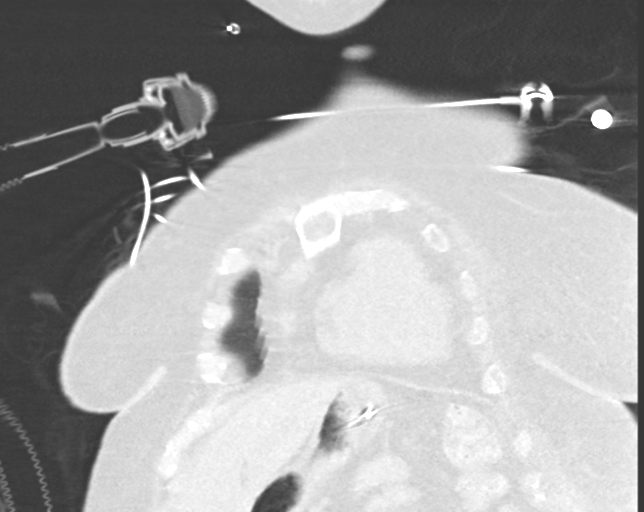
[im 41/101  lung]
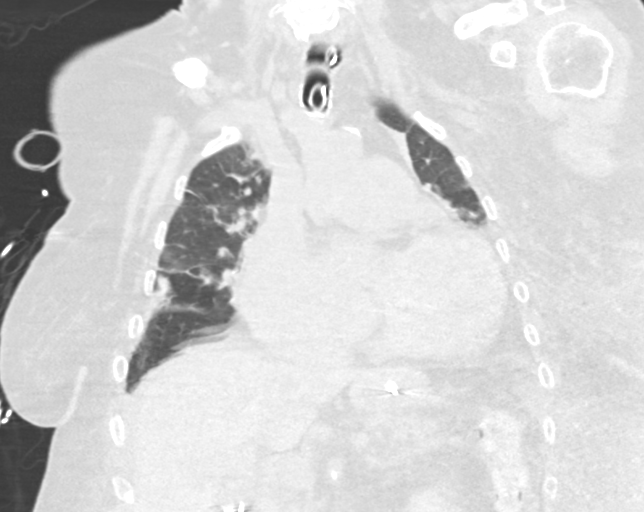
[im 61/101  lung]
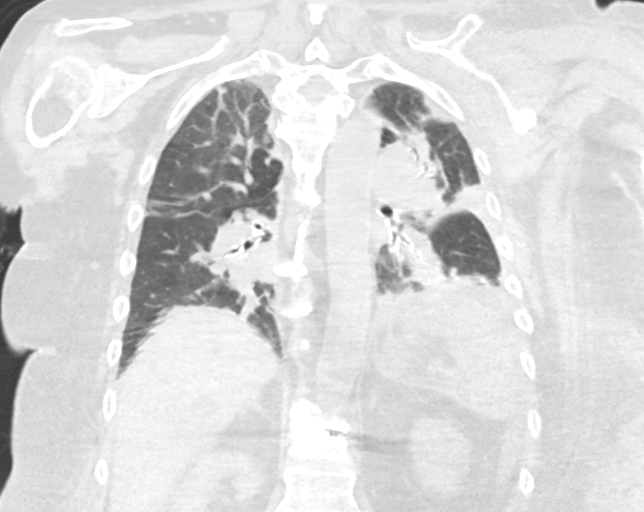

[15 of 36 positions shown; findings below may reference images not displayed]

FINDINGS: Cardiovascular: Cardiomegaly. No pericardial effusion. Mitral valve
annular calcifications.

No evidence of thoracic aortic aneurysm. Atherosclerotic
calcifications of the aortic arch.

Hypodense blood pool relative to myocardium, suggesting anemia.

Mediastinum/Nodes: No suspicious mediastinal lymphadenopathy.

Visualized thyroid is unremarkable.

Lungs/Pleura: Endotracheal tube terminates 3 cm above the carina.

Progressive linear/patchy opacities in the bilateral upper lobes
(for example, series 8/image 38). Additional linear/patchy opacities
in the mid/lower lungs are chronic, suggesting scarring (for
example, series 8/image 72 in the right middle lobe). However, there
is progressive patchy opacities in the lingula and bilateral lower
lobes, possibly atelectasis, although pneumonia is not excluded.

No frank interstitial edema.

Small bilateral pleural effusions, mildly progressive on the left
and new on the right.

No suspicious pulmonary nodules, noting motion degradation.

No pneumothorax.

Upper Abdomen: Enteric tube coursing into the gastric antrum.
Cholecystectomy clips. Vascular calcifications.

Musculoskeletal: Degenerative changes of the visualized
thoracolumbar spine. Vertebral augmentation at T12.
IMPRESSION: Multifocal patchy opacities, some of which may reflect
atelectasis/scarring, although lingular and lower lobe pneumonia
(left greater than right) is not excluded.

Small bilateral pleural effusions, mildly progressive on the left
and new on the right.

No frank interstitial edema.

Endotracheal tube terminates 3 cm above the carina. Enteric tube
courses into the distal gastric antrum.

Aortic Atherosclerosis (FUWNW-ANI.I).

## 2023-05-21 IMAGING — DX DG CHEST 1V PORT
1 series · 1 of 1 positions shown · non-contrast
Comparison: Most recent radiograph 12/25/2008. Most recent CT
11/22/2020

CLINICAL DATA: Shortness of breath.  Hypoxia.

EXAM:
PORTABLE CHEST 1 VIEW

[chest ap]
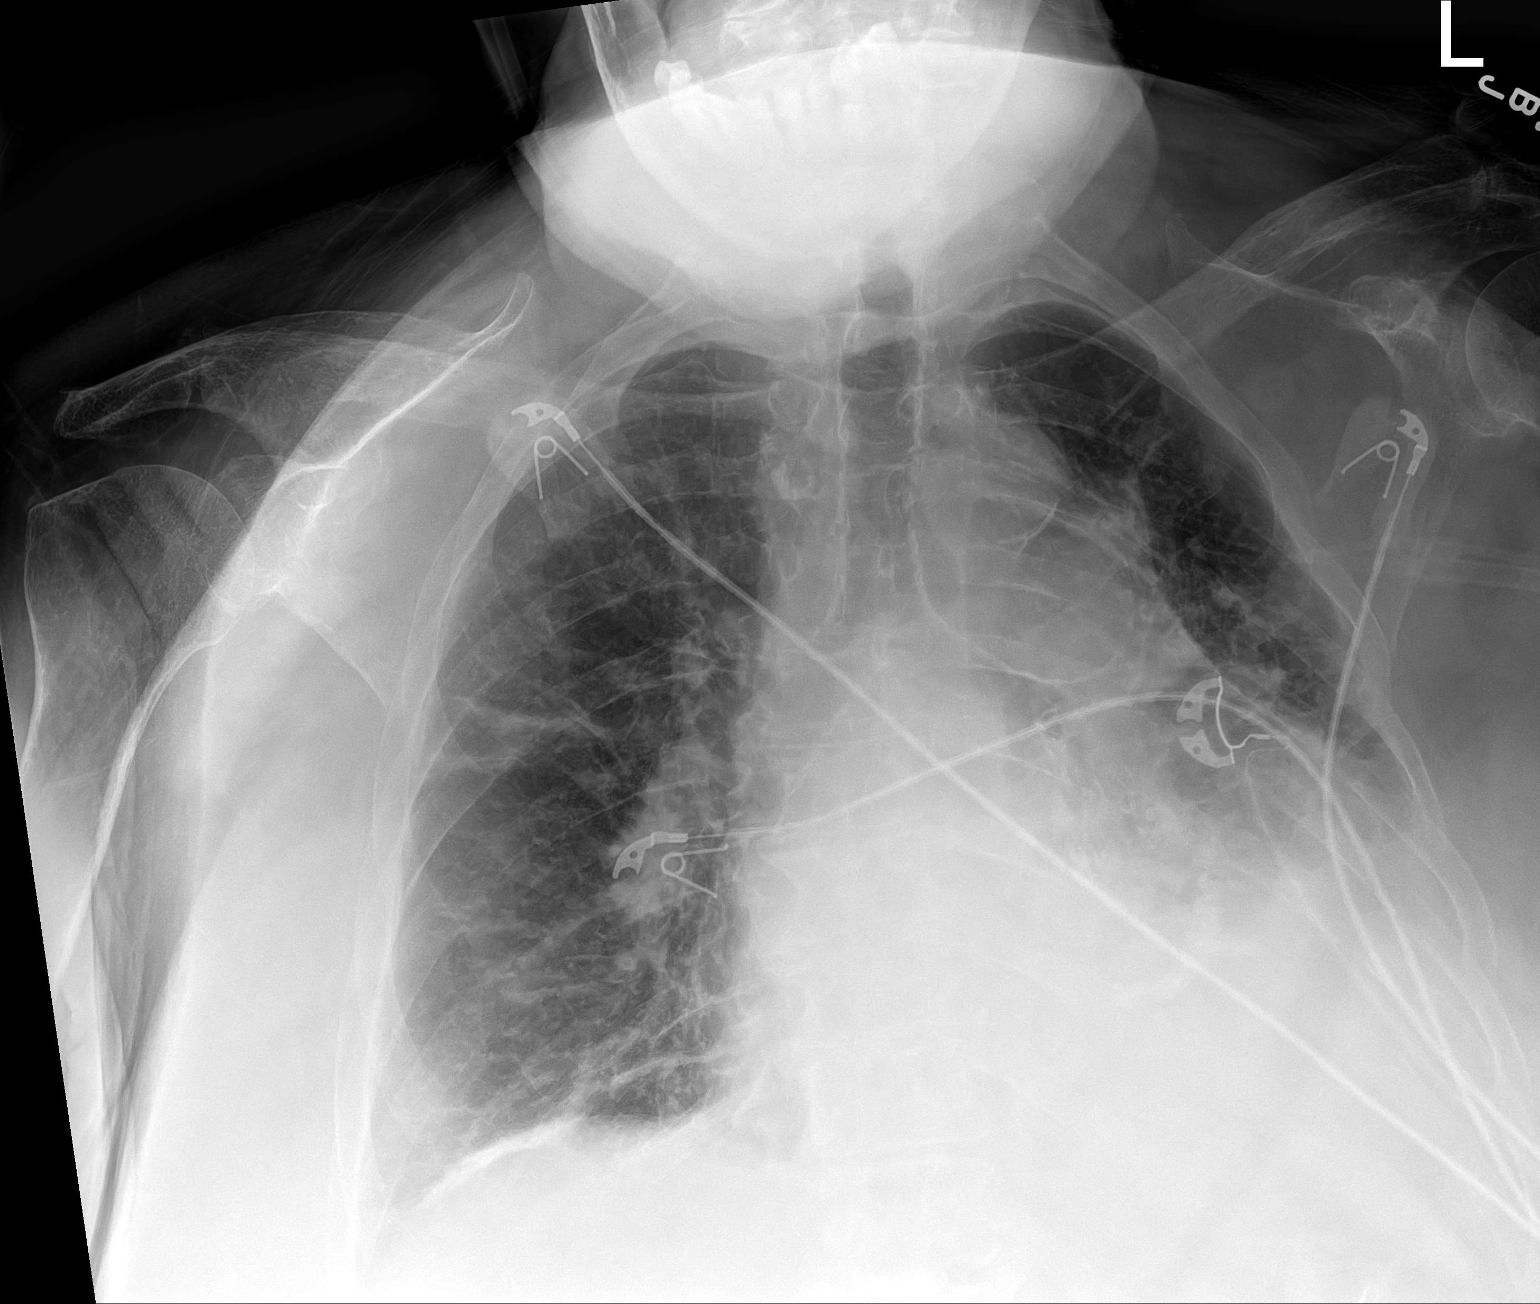

[1 of 1 positions shown; findings below may reference images not displayed]

FINDINGS: Marked chronic cardiomegaly. Retrocardiac opacity likely combination
of pleural effusion and atelectasis, similar to prior exam. Mild
scarring in the right mid lung and right lung base. No new airspace
disease. No pneumothorax. Bones under mineralized. Previous right
upper extremity PICC has been removed.
IMPRESSION: Chronic cardiomegaly. Retrocardiac opacity likely combination of
pleural effusion and atelectasis, similar to prior exam 10 days ago.
Scattered scarring/atelectasis in the right lung.

## 2023-05-23 IMAGING — DX DG CHEST 1V PORT
1 series · 1 of 1 positions shown · non-contrast
Comparison: CT 01/03/2021.  Chest x-ray 01/03/2021.

CLINICAL DATA: Acute respiratory failure.

EXAM:
PORTABLE CHEST 1 VIEW

[chest ap]
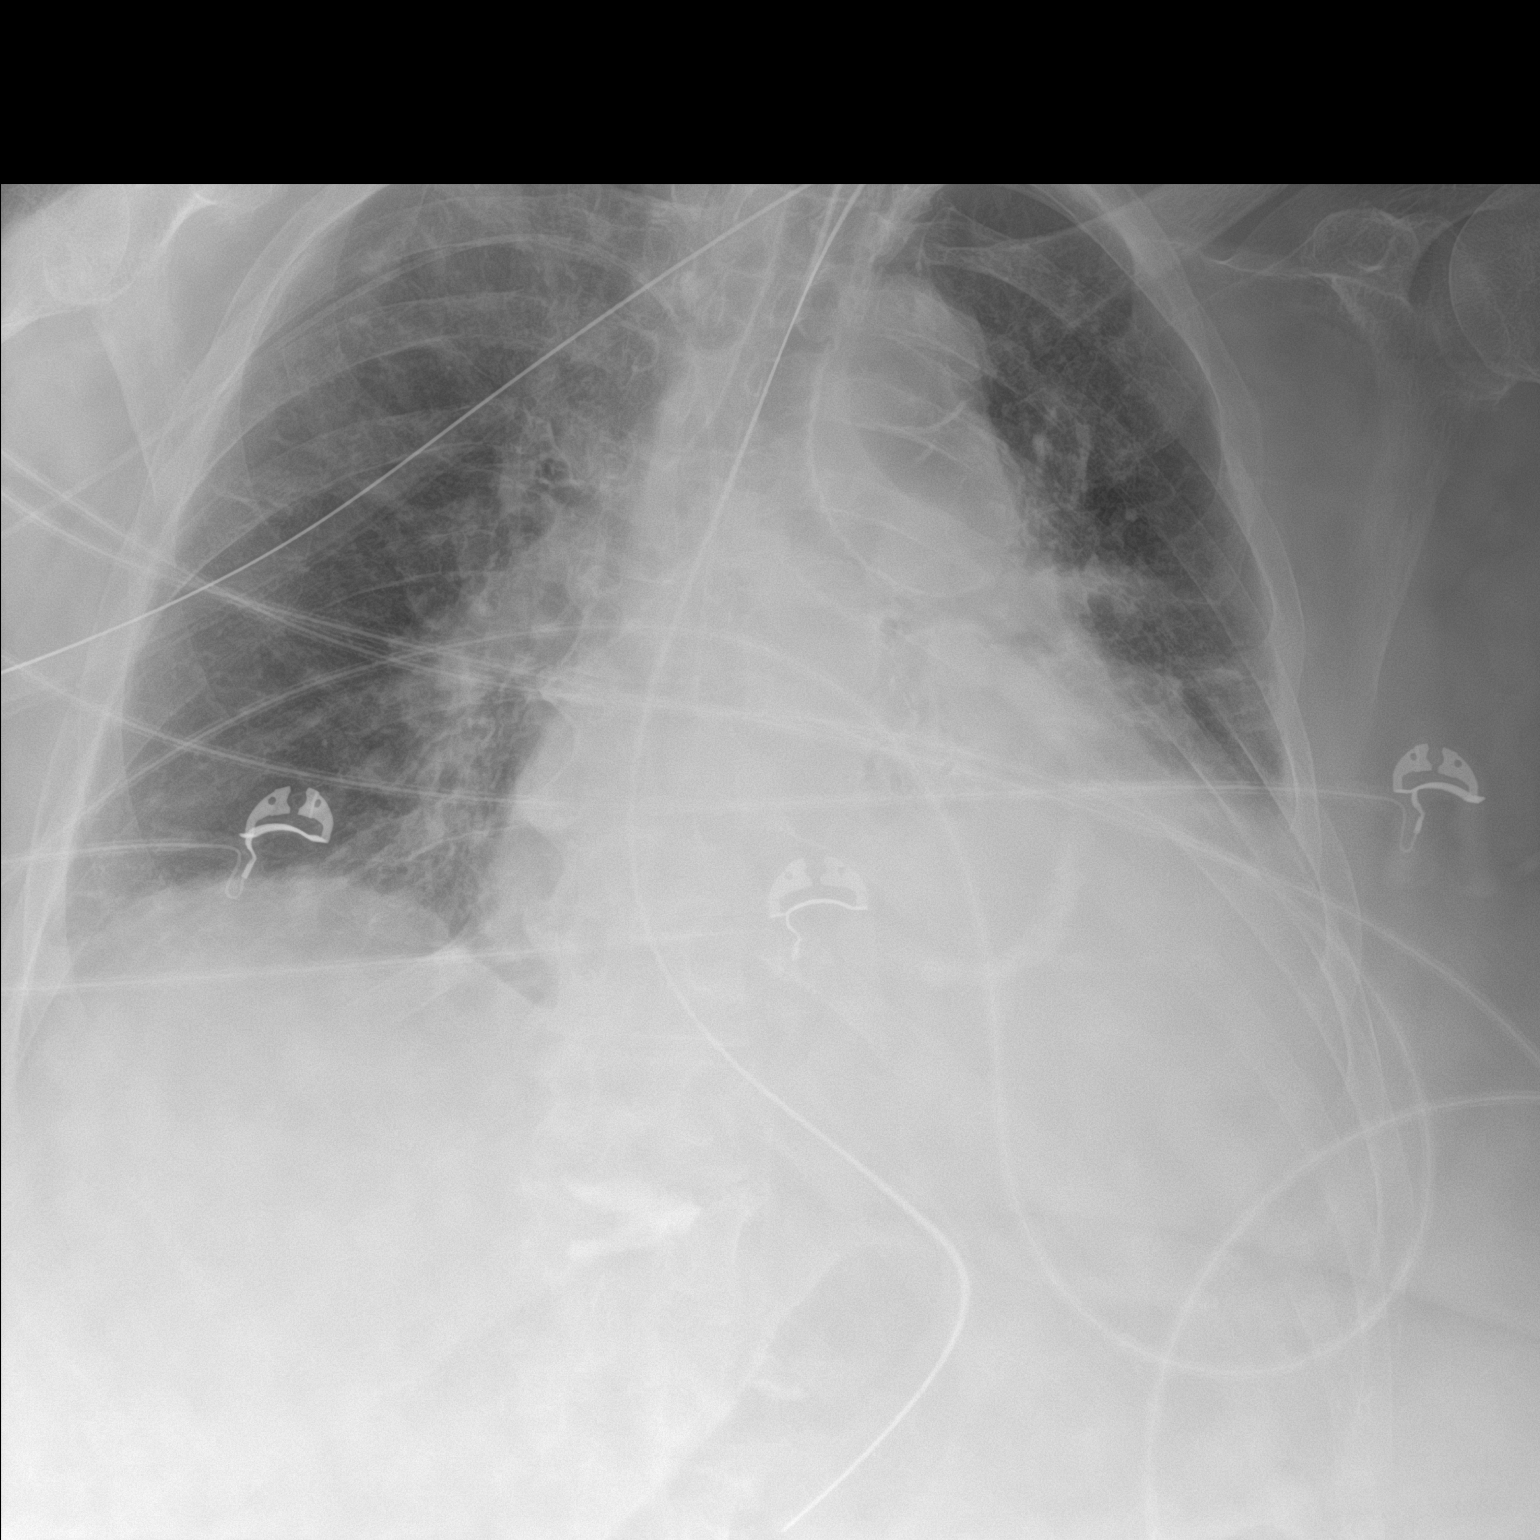

[1 of 1 positions shown; findings below may reference images not displayed]

FINDINGS: Endotracheal tube and NG tube in stable position. Right PICC line
stable position. Mitral annular calcification again noted.
Cardiomegaly again noted. No pulmonary venous congestion. Persistent
bibasilar atelectasis/infiltrates, left side greater than right.
Interval partial clearing of bilateral upper lobe
atelectasis/infiltrates. Small left pleural effusion cannot be
excluded. No pneumothorax. Prior lower thoracic vertebroplasty.
IMPRESSION: 1.  Lines and tubes in stable position.

2.  Cardiomegaly again noted.  No pulmonary venous congestion.

3. Persistent bibasilar atelectasis/infiltrates, left side greater
than right again noted. Interval partial clearing of bilateral upper
lobe atelectasis/infiltrates. Small left pleural effusion cannot be
excluded.

## 2023-05-24 ENCOUNTER — Inpatient Hospital Stay: Payer: PPO | Admitting: Gastroenterology

## 2023-05-24 LAB — LAB REPORT - SCANNED: EGFR: 59

## 2023-05-25 ENCOUNTER — Ambulatory Visit (HOSPITAL_COMMUNITY)
Admission: RE | Admit: 2023-05-25 | Discharge: 2023-05-25 | Disposition: A | Discharge status: Home / Self Care | Payer: PPO | Source: Ambulatory Visit | Attending: Acute Care | Admitting: Acute Care

## 2023-05-25 ENCOUNTER — Other Ambulatory Visit: Payer: Self-pay | Admitting: Acute Care

## 2023-05-25 DIAGNOSIS — Z9911 Dependence on respirator [ventilator] status: Secondary | ICD-10-CM | POA: Insufficient documentation

## 2023-05-25 DIAGNOSIS — Z430 Encounter for attention to tracheostomy: Secondary | ICD-10-CM | POA: Insufficient documentation

## 2023-05-25 DIAGNOSIS — J9611 Chronic respiratory failure with hypoxia: Secondary | ICD-10-CM | POA: Insufficient documentation

## 2023-05-25 DIAGNOSIS — G4733 Obstructive sleep apnea (adult) (pediatric): Secondary | ICD-10-CM | POA: Diagnosis present

## 2023-05-25 DIAGNOSIS — Z930 Tracheostomy status: Secondary | ICD-10-CM

## 2023-05-25 DIAGNOSIS — J386 Stenosis of larynx: Secondary | ICD-10-CM | POA: Diagnosis not present

## 2023-05-25 DIAGNOSIS — E662 Morbid (severe) obesity with alveolar hypoventilation: Secondary | ICD-10-CM | POA: Insufficient documentation

## 2023-05-25 DIAGNOSIS — J9612 Chronic respiratory failure with hypercapnia: Secondary | ICD-10-CM | POA: Diagnosis not present

## 2023-05-25 IMAGING — DX DG CHEST 1V PORT
1 series · 1 of 1 positions shown · non-contrast
Comparison: January 05, 2021.

CLINICAL DATA: Status post tracheostomy.

EXAM:
PORTABLE CHEST 1 VIEW

[chest]
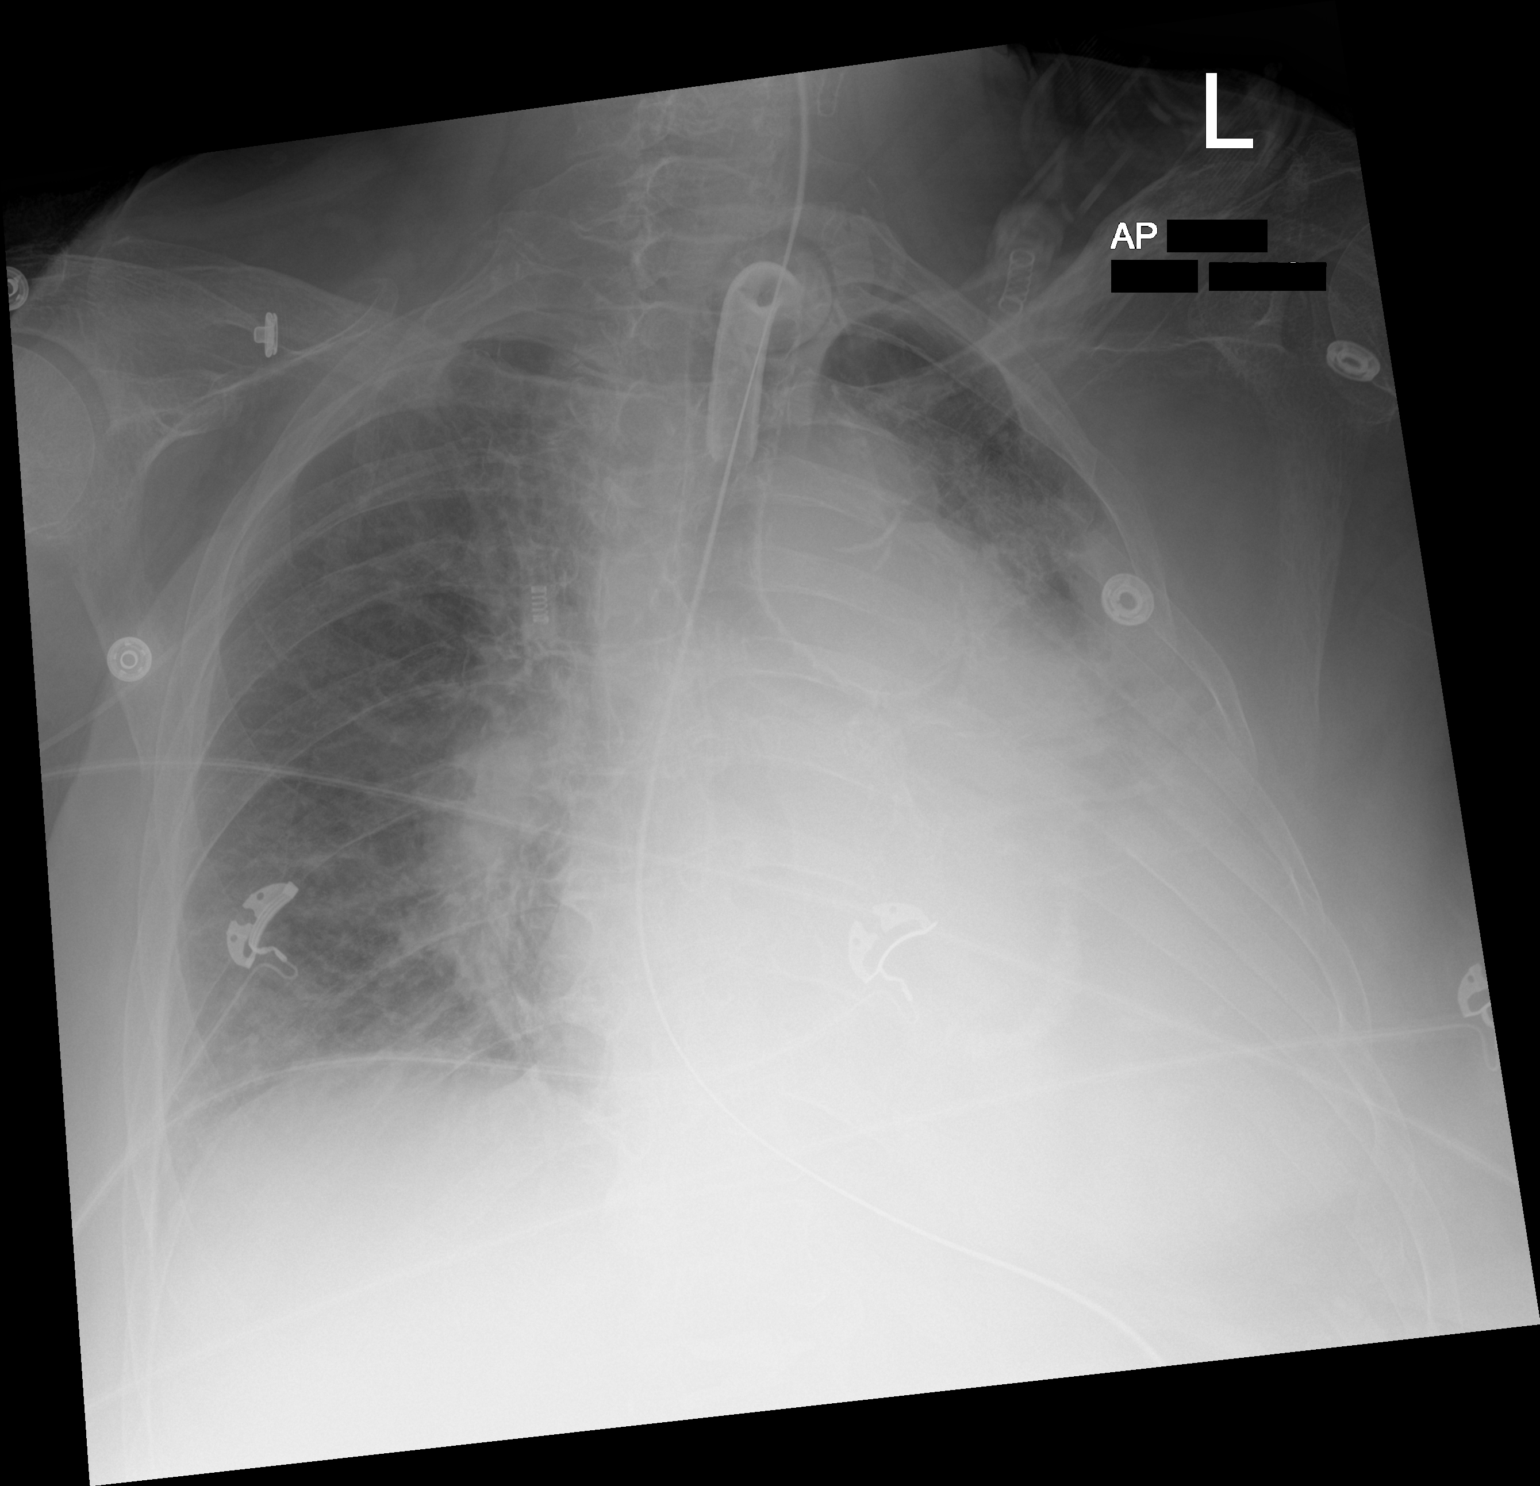

[1 of 1 positions shown; findings below may reference images not displayed]

FINDINGS: Stable cardiomegaly. Tracheostomy tube is in grossly good position.
Nasogastric tube is seen entering stomach. No pneumothorax is noted.
Significantly increased left lung opacity is noted concerning for
pneumonia or edema with probable associated effusion. Bony thorax is
unremarkable.
IMPRESSION: Tracheostomy tube in grossly good position. Increased left lung
opacity as described above.

## 2023-05-25 MED ORDER — LIDOCAINE HCL 2 % EX GEL: 1.0000 | CUTANEOUS | 0 refills | Status: DC | PRN

## 2023-05-25 NOTE — Progress Notes (Signed)
Tracheostomy Procedure Note  AIYAHNA FARQUHAR [id number] 1950/08/23  Pre Procedure Tracheostomy Information  Trach Brand: Shiley Size:  [id number] Style: Cuffed Secured by: Velcro   Procedure: Trach change and bronchoscopy. Bronch done for evaluation of airway.    Post Procedure Tracheostomy Information  Trach Brand: Shiley Size:  6CN75 Style: Cuffed Secured by: Velcro   Post Procedure Evaluation:  ETCO2 positive color change from yellow to purple : Yes.   Vital signs: stable Patients current condition: stable Complications: No apparent complications Trach site exam: clean, dry Wound care done: dry, sterile, and 4 x 4 gauze Patient did tolerate procedure well.   Education: none  Prescription needs: none    Additional needs: Additional [id number] trach given to pt.

## 2023-05-25 NOTE — Progress Notes (Signed)
Reason for visit Planned trach change  HPI 72 year old pt well known to me. Follow her for trach dependence for OHS,OSA on nocturnal ventilation. I last saw her Aug 2023. She has a more complicated trach hx for on-going aphonia for which I referred to her [facility].   Recent ENT Hx -Referred to ENT May 2023 for subglottis stenosis. subglottic mass removed  -direct laryngoscopy and airway evaluation in September 2023.  Large subglottic posteriorly-based granuloma which was eminating superiorly through the glottis, biopsied and send for permanent pathology; thick circumferential subglottic granulations tissue, which was removed with cupped forceps and coblator  Much improved subglottic airway following the procedure F/u 11/8: had been doing better initially post-op.  he visualized subglottis and proximal trachea are patent and the Shiley tracheostomy tube is visible in the subglottis the trachea is edematous and there does not appear to be much room around the tracheostomy device they recommended fenestrated trach which we placed.  This was subsequently discontinued as the patient did not tolerate it. As of last visit family was re-visiting ENT about possible surgical intervention.   Presents today for planned trach for early visit today because recently had trach change at home by [facility] and patient has been having some concern about placement. Also has had on-going non-prod cough    Review of Systems  Constitutional:  Negative for chills, fever, malaise/fatigue and weight loss.  HENT: Negative.    Eyes: Negative.   Respiratory:  Positive for cough.        Non-productive. At night frequently. + reflux  Cardiovascular: Negative.   Gastrointestinal:  Positive for abdominal pain and heartburn.  Genitourinary: Negative.   Musculoskeletal: Negative.   Skin: Negative.   Endo/Heme/Allergies: Negative.     Exam General 72 year old female sitting up in wheel chair. She is in no distress HENT NCAT.  Has # 6 cuffed trach. External stoma was unremarkable. I did evaluate the airway w/ bronchoscope (see below)  Pulm clear has occ NP cough  Card rrr Abd soft  Ext warm no sig edema Neuro baseline   Procedure The inner cannula of trach was removed. Pediatric bronch passed thru trach. The distal end of trach was sitting perfectly midline there was no lesion of abrasions noted on post tracheal wall. Trach removed.site inspected, scope then passed thru the stoma site again inspected. Some granulation tissue but minimal and non-obstructive. new trach placed over obturator. Pt tolerated well. Placement verified via etco2   Active Problems:   Tracheostomy status (HCC)   Subglottic stenosis   OSA (obstructive sleep apnea)   Chronic respiratory failure with hypoxia and hypercapnia (HCC)   Dependence on home ventilator (HCC)   Obesity hypoventilation syndrome (HCC)       Tracheostomy status (HCC) Overview:  Trach Brand: Shiley Size:  6.0 [id number] Style: Cuffed Secured by: Velcro  Last change 9/16 prior to today   Discussion Stable tracheostomy.  Not a candidate for decannulation due to subglottic stenosis. She is being followed by Dr Delford Field at [facility]. Sounds like surgery in near future for remove granulation and dilate the trachea, and intra operative Kenalog injection  I do not feel like this will make her a candidate for decannulation but at least it may improve her QOL if it allows phonation  Plan Continue routine trach care ROV 4 weeks for planned tracheostomy change Continue nocturnal ventilation          My time 26 min   Simonne Martinet ACNP-BC [employer]  Pulmonary/Critical Care Pager # [phone] OR # [phone] if no answer

## 2023-05-26 ENCOUNTER — Ambulatory Visit (HOSPITAL_COMMUNITY): Payer: PPO

## 2023-05-26 ENCOUNTER — Telehealth: Payer: Self-pay | Admitting: Family Medicine

## 2023-05-26 ENCOUNTER — Telehealth: Payer: Self-pay | Admitting: Gastroenterology

## 2023-05-26 IMAGING — DX DG ABD PORTABLE 1V
1 series · 1 of 1 positions shown · non-contrast
Comparison: Portable exam 9963 hours compared to 09/01/2017

CLINICAL DATA: Nasogastric tube placement

EXAM:
PORTABLE ABDOMEN - 1 VIEW

[abdomen supine]
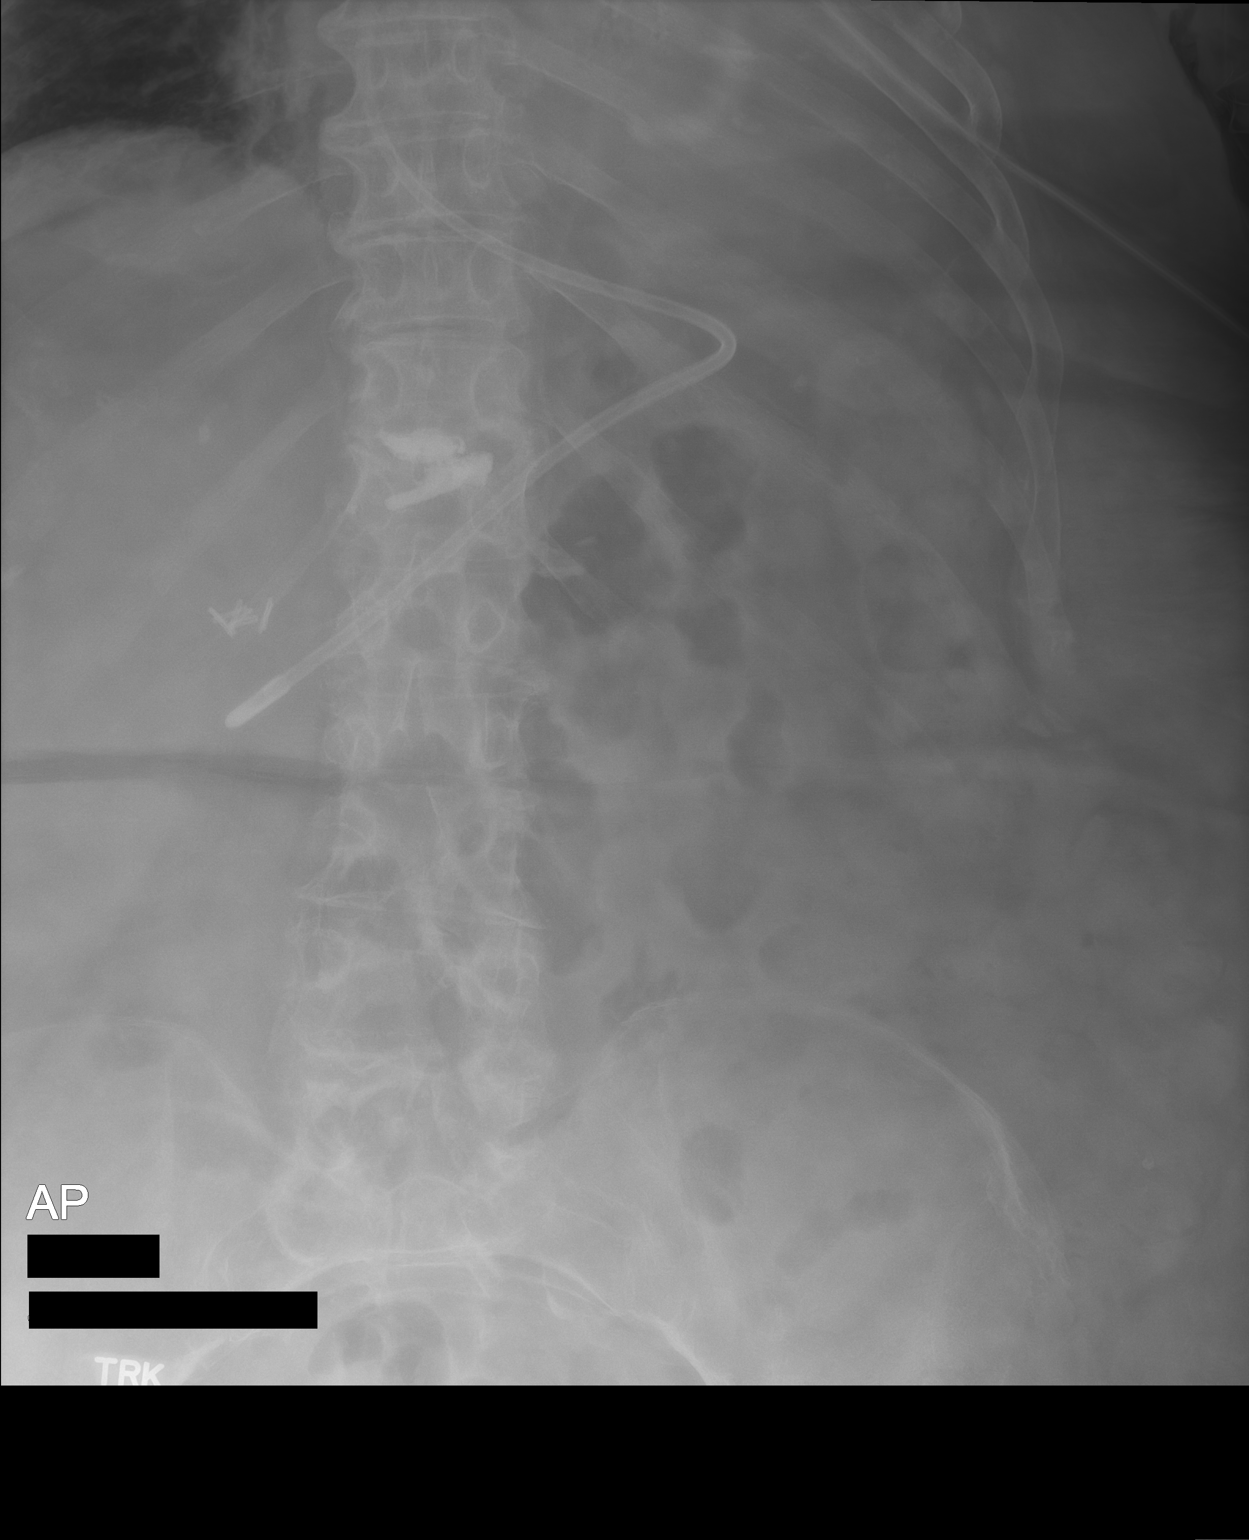

[1 of 1 positions shown; findings below may reference images not displayed]

FINDINGS: Tip of feeding tube projects over distal gastric antrum/pyloric
region.

Nonobstructive bowel gas pattern.

Bones demineralized with prior spinal augmentation procedure of T12.

Surgical clips RIGHT upper quadrant question cholecystectomy.
IMPRESSION: Tip of feeding tube projects over distal gastric antrum/pylorus.

## 2023-05-26 NOTE — Telephone Encounter (Signed)
Patient has done her flu shot already for 2024

## 2023-05-26 NOTE — Telephone Encounter (Signed)
Documented

## 2023-05-26 NOTE — Telephone Encounter (Signed)
[name], can we find out if patient completed labs ordered by PCP? I do not see any results in Epic.

## 2023-05-27 IMAGING — DX DG ABD PORTABLE 1V
1 series · 1 of 1 positions shown · non-contrast
Comparison: 01/08/2021

CLINICAL DATA: Check gastric catheter placement

EXAM:
PORTABLE ABDOMEN - 1 VIEW

[abdomen]
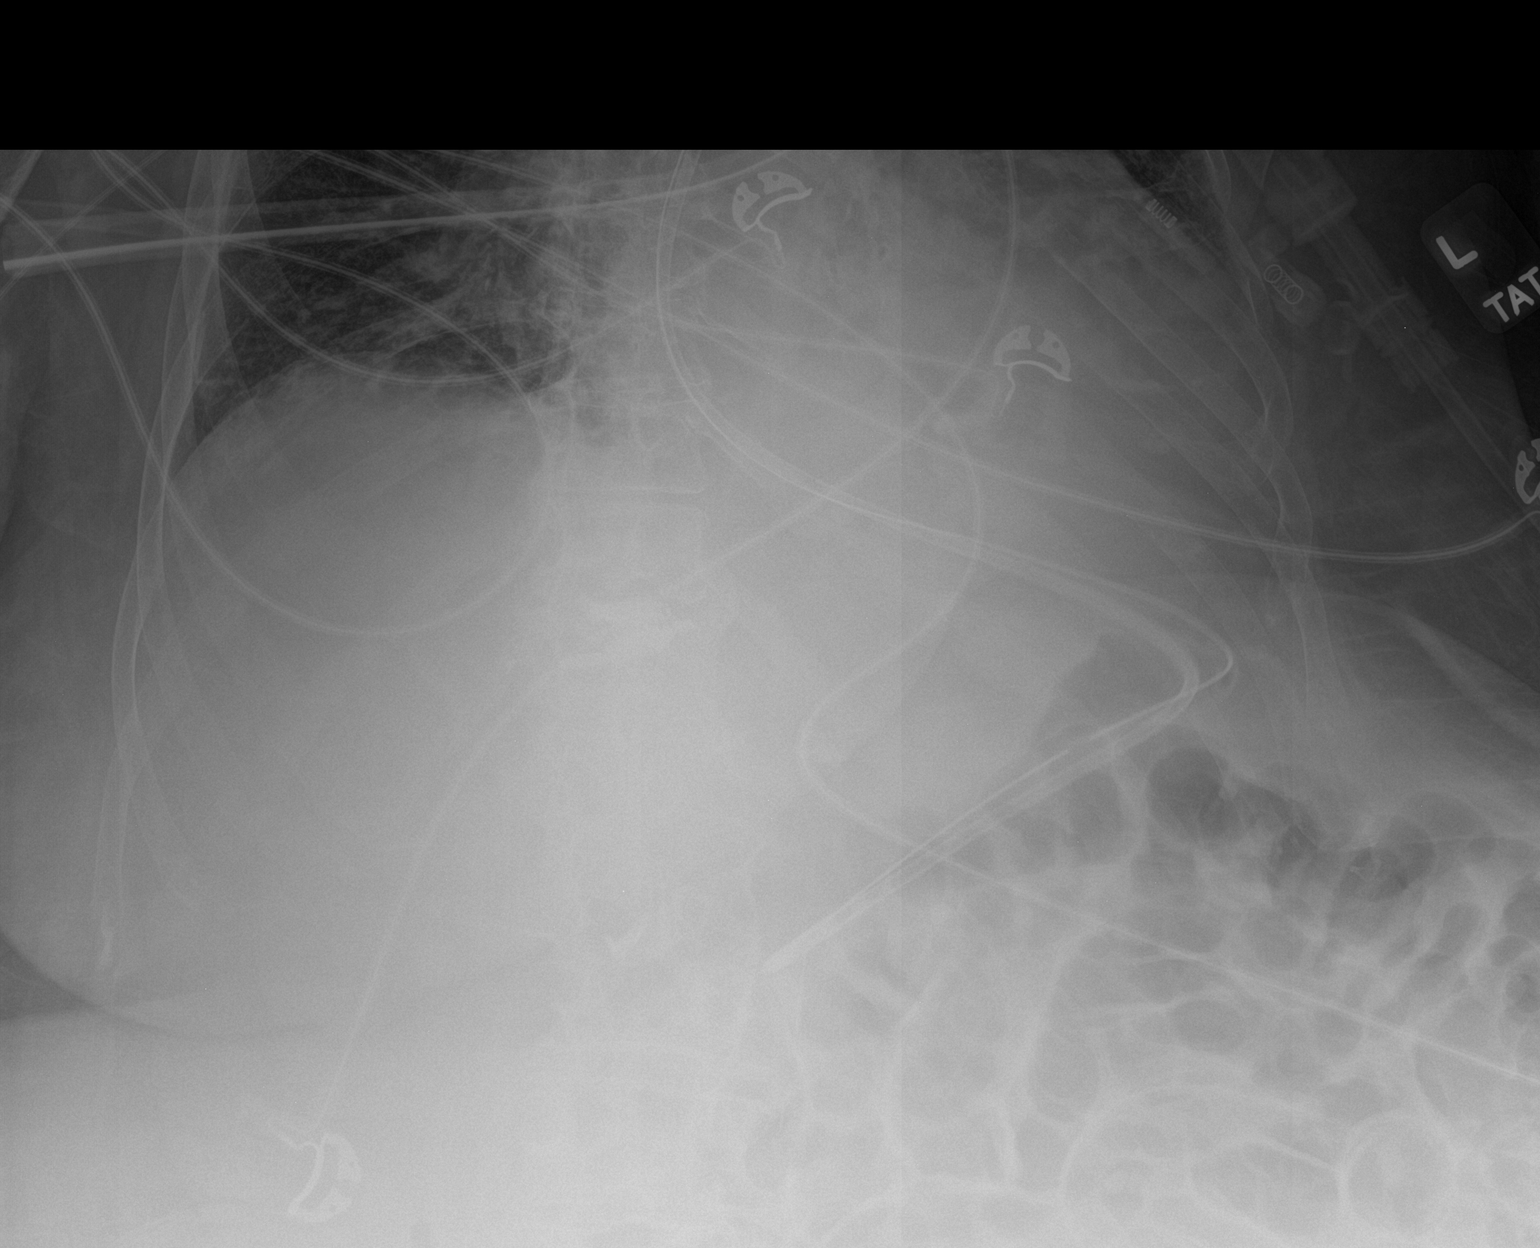

[1 of 1 positions shown; findings below may reference images not displayed]

FINDINGS: Feeding catheter is again noted in the distal stomach. New gastric
catheter is noted alongside the feeding catheter extending into the
distal stomach. No free air is seen.
IMPRESSION: Feeding catheter and nasogastric catheter in the distal stomach as
described.

## 2023-05-28 IMAGING — DX DG CHEST 1V PORT
1 series · 1 of 1 positions shown · non-contrast
Comparison: Chest x-rays dated 01/07/2021 and 01/05/2021.

CLINICAL DATA: Respiratory dependent.  Tracheostomy.

EXAM:
PORTABLE CHEST 1 VIEW

[chest]
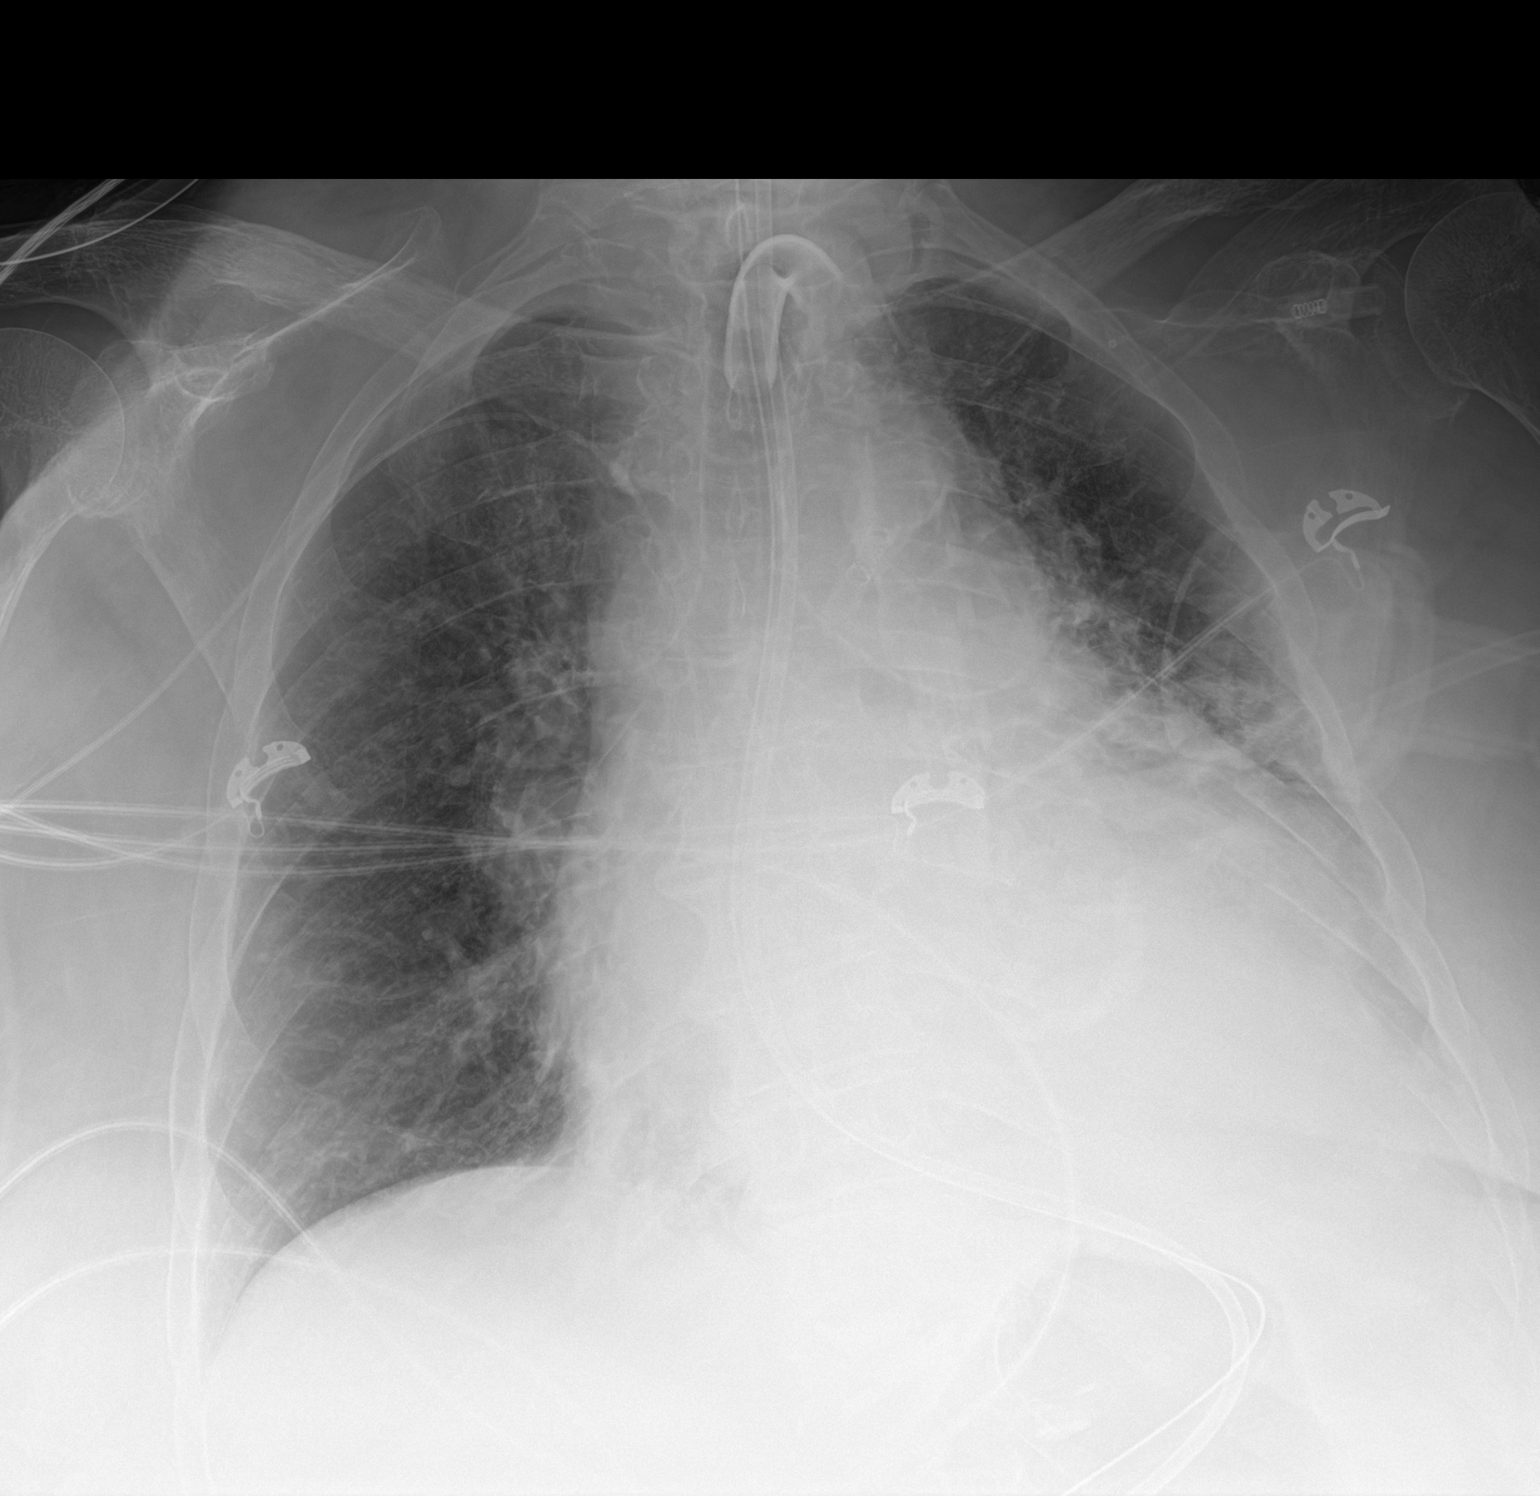

[1 of 1 positions shown; findings below may reference images not displayed]

FINDINGS: Tracheostomy tube appears appropriately positioned in the midline.
Enteric tubes passes below the diaphragm. RIGHT-sided PICC line
appears well positioned.

Stable cardiomegaly. Stable LEFT basilar opacity, better
demonstrated on chest CT of 01/03/2021, atelectasis versus
pneumonia. RIGHT lung is clear. No pneumothorax is seen.
IMPRESSION: 1. Tubes and lines are stable in position.
2. Stable LEFT basilar opacity, atelectasis versus pneumonia,
similar in extent to the findings on earlier chest CT of 01/03/2021.

## 2023-05-29 IMAGING — XA IR PERC PLACEMENT GASTROSTOMY
2 series · 7 of 7 positions shown · non-contrast
Comparison: none

INDICATION: 69-year-old female with a history of dysphagia, referred for G-tube
placement

[Series 2: fl angio · 4 of 21 frames shown]
[frame 4/21]
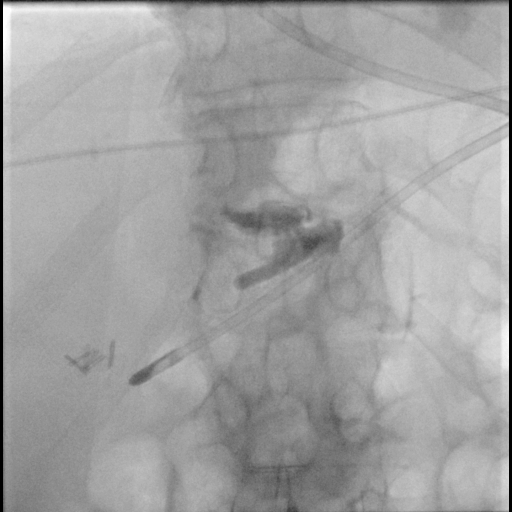
[frame 9/21]
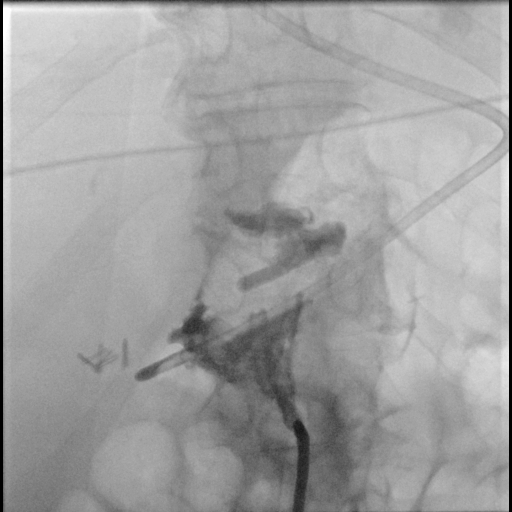
[frame 11/21]
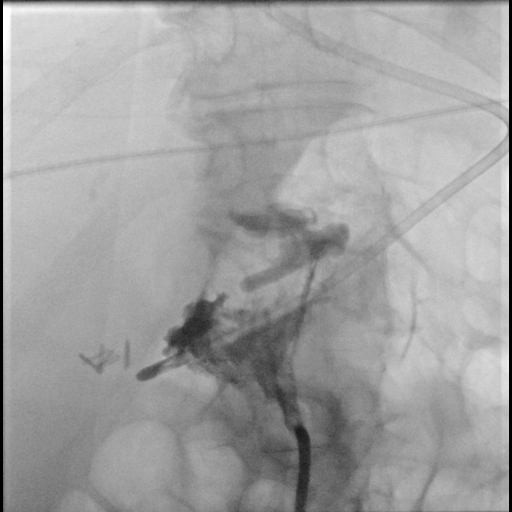
[frame 18/21]
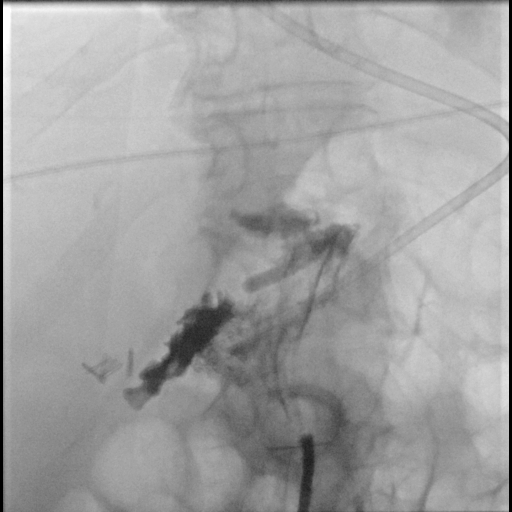

[Series 300: ir gastrostomy tube mod sed · 3 of 3 slices shown]
[im 1/3]
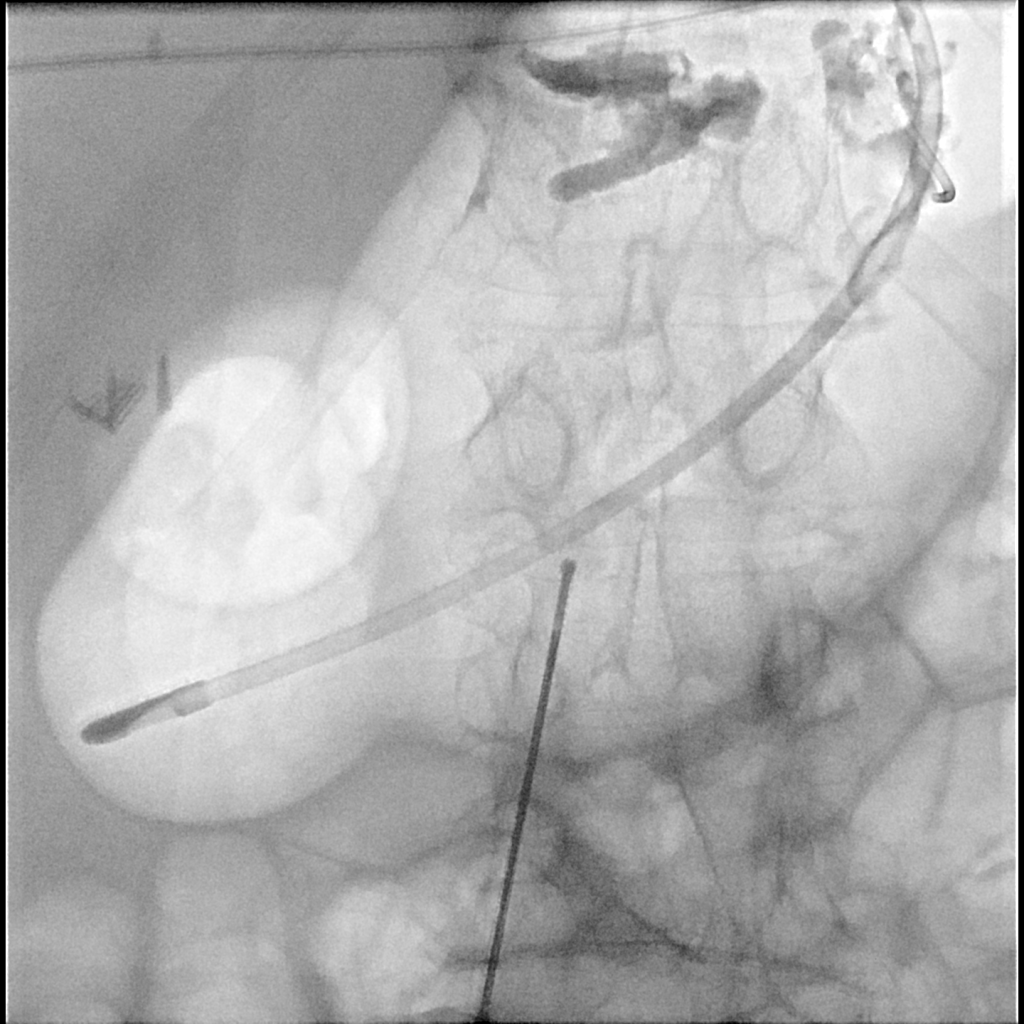
[im 2/3]
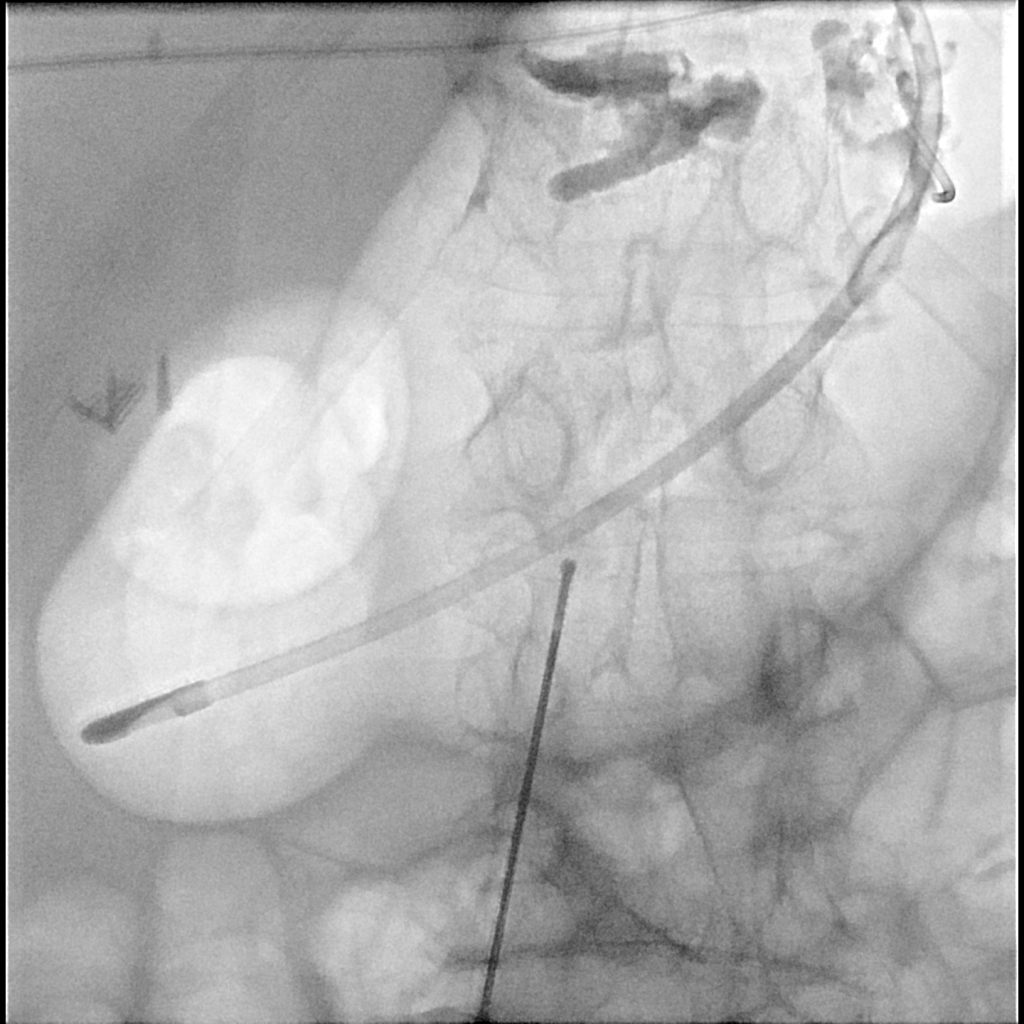
[im 3/3]
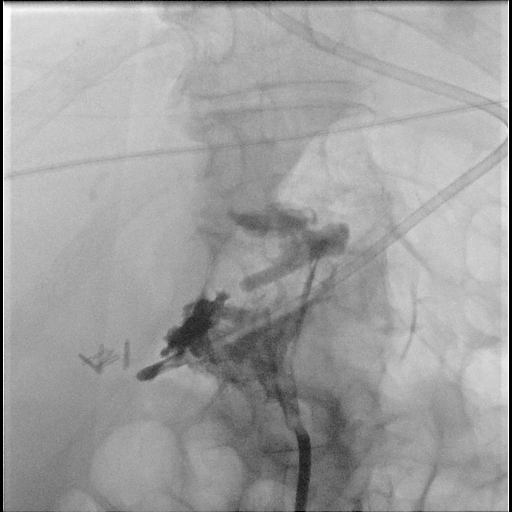

[7 of 7 positions shown; findings below may reference images not displayed]

EXAM:
PERC PLACEMENT GASTROSTOMY

MEDICATIONS:
2 g Ancef; Antibiotics were administered within 1 hour of the
procedure.

ANESTHESIA/SEDATION:
Versed 0.5 mg IV; Fentanyl 25 mcg IV

Moderate Sedation Time:  10 minutes

The patient was continuously monitored during the procedure by the
interventional radiology nurse under my direct supervision.

CONTRAST:  20mL OMNIPAQUE IOHEXOL 300 MG/ML SOLN - administered into
the gastric lumen.

FLUOROSCOPY TIME:  Fluoroscopy Time: 2 minutes 30 seconds (250 mGy).

COMPLICATIONS:
None

PROCEDURE:
Informed written consent was obtained from the patient and the
patient's family after a thorough discussion of the procedural
risks, benefits and alternatives. All questions were addressed.
Maximal Sterile Barrier Technique was utilized including caps, mask,
sterile gowns, sterile gloves, sterile drape, hand hygiene and skin
antiseptic. A timeout was performed prior to the initiation of the
procedure.

The epigastrium was prepped with Betadine in a sterile fashion, and
a sterile drape was applied covering the operative field. A sterile
gown and sterile gloves were used for the procedure.

A 5-French orogastric tube is placed under fluoroscopic guidance.
Scout imaging of the abdomen confirms barium within the transverse
colon.

The stomach was distended with gas. Under fluoroscopic guidance, an
18 gauge needle was utilized to puncture the anterior wall of the
body of the stomach. An Amplatz wire was advanced through the needle
passing a T fastener into the lumen of the stomach. The T fastener
was secured for gastropexy. A 9-French sheath was inserted.

A snare was advanced through the 9-French sheath. Lusin Bv was
advanced through the orogastric tube. It was snared then pulled out
the oral cavity, pulling the snare, as well. The leading edge of the
gastrostomy was attached to the snare. It was then pulled down the
esophagus and out the percutaneous site. Tube secured in place.
Contrast was injected.

Patient tolerated the procedure well and remained hemodynamically
stable throughout.

No complications were encountered and no significant blood loss
encountered.
IMPRESSION: Status post fluoroscopic placed percutaneous gastrostomy tube, with
20 French pull-through.

## 2023-05-30 ENCOUNTER — Other Ambulatory Visit: Payer: Self-pay | Admitting: Family Medicine

## 2023-05-30 ENCOUNTER — Ambulatory Visit: Payer: PPO | Attending: Cardiology | Admitting: Student

## 2023-05-30 ENCOUNTER — Encounter: Payer: Self-pay | Admitting: Student

## 2023-05-30 VITALS — BP 118/76 | HR 84 | Wt 169.0 lb

## 2023-05-30 DIAGNOSIS — I272 Pulmonary hypertension, unspecified: Secondary | ICD-10-CM | POA: Diagnosis not present

## 2023-05-30 DIAGNOSIS — J9611 Chronic respiratory failure with hypoxia: Secondary | ICD-10-CM | POA: Diagnosis not present

## 2023-05-30 DIAGNOSIS — J9612 Chronic respiratory failure with hypercapnia: Secondary | ICD-10-CM

## 2023-05-30 DIAGNOSIS — I5032 Chronic diastolic (congestive) heart failure: Secondary | ICD-10-CM | POA: Diagnosis not present

## 2023-05-30 DIAGNOSIS — I4811 Longstanding persistent atrial fibrillation: Secondary | ICD-10-CM

## 2023-05-30 MED ORDER — LORAZEPAM 1 MG PO TABS: 1.0000 mg | ORAL_TABLET | Freq: Every evening | ORAL | 0 refills | Status: DC

## 2023-05-30 MED ORDER — FUROSEMIDE 20 MG PO TABS: 20.0000 mg | ORAL_TABLET | Freq: Every day | ORAL | 3 refills | Status: AC

## 2023-05-30 NOTE — Progress Notes (Signed)
Cardiology Office Note    Date:  05/30/2023  ID:  Tamekia, Potosky Aug 11, 1950, MRN [id number] Cardiologist: Dina Rich, MD    History of Present Illness:    MA OURSLER is a 72 y.o. female with past medical history of HFimpEF (EF 40% in 2016, improved to 55-60% by repeat imaging in 01/2015, 60-65% by echo in 11/2017, reported at 15-20% in 08/2020 and cath showed normal cors, EF 55-60% in 11/2020 and 45-50% in 01/2021), pulmonary HTN, chronic hypoxic respiratory failure (tracheostomy in place), persistent atrial fibrillation, HTN and OSA (intolerant to CPAP) who presents to the office today for 7-month follow-up.  She was last examined by Dr. Wyline Mood in 09/2022 and reported occasional palpitations at that time but no persistent symptoms. She had been on Amiodarone and this was discontinued given persistent atrial fibrillation. She was continued on Eliquis 5 mg twice daily, Atorvastatin 80 mg daily, Lasix 40 mg daily and Lopressor 25 mg twice daily.  She did meet with Dr. Kirke Corin in 02/2023 as she had been referred by her PCP for possible PAD. Her leg edema was felt to be due to chronic venous insufficiency and she was encouraged to use knee-high support stockings. Lower extremity ABI's were obtained and showed calcified vessels along the lower extremities but no significant stenosis.  She was hospitalized at [facility] in 04/2023 for aspiration pneumonia and pneumonitis in the setting of a chronic tracheostomy. She was continued on Eliquis but Lopressor was reduced during admission due to hypotension but it appears this was resumed at 25 mg twice daily at the time of discharge.  In talking with the patient and her daughter today, she reports she feels weak when taking Lasix 40 mg daily and they have been giving her 20 mg daily more frequently and symptoms have overall been well-controlled with this. She has baseline dyspnea in the setting of chronic hypoxic respiratory failure and a  tracheostomy but no acute changes in this. No specific orthopnea or PND. She denies any recent chest pain or palpitations. They report she is planning to undergo surgery early next year for dilation of her trachea.  Studies Reviewed:   EKG: EKG is not ordered today.   Cardiac Catheterization: 08/2020 Findings:  1. No significant coronary artery disease.  2. Elevated right and left ventricular filling pressures (LVEDP = 27 mm  Hg).  3. Severe pulmonary hypertension    Echocardiogram: 01/2021 IMPRESSIONS     1. Left ventricular ejection fraction, by estimation, is 45 to 50%. Left  ventricular ejection fraction by PLAX is 46 %. The left ventricle has  mildly decreased function. The left ventricle demonstrates global  hypokinesis. There is moderate concentric  left ventricular hypertrophy. Left ventricular diastolic parameters are  indeterminate.   2. Right ventricular systolic function is moderately reduced. The right  ventricular size is normal. There is severely elevated pulmonary artery  systolic pressure. The estimated right ventricular systolic pressure is  60.1 mmHg.   3. Tricuspid valve regurgitation is at least moderate.   4. The inferior vena cava is normal in size with <50% respiratory  variability, suggesting right atrial pressure of 8 mmHg.   5. The mitral valve is abnormal. Mild to moderate mitral valve  regurgitation, with blunting of systolic pulmonary vein flow. No evidence  of mitral stenosis. Severe mitral annular calcification.   6. Left atrial size was severely dilated.   7. The aortic valve is tricuspid. There is mild calcification of the  aortic valve. Aortic  valve regurgitation is not visualized. No aortic  stenosis is present.   Comparison(s): A prior study was performed on 11/22/20. Prior images  reviewed side by side. LV function appears slightly less vigorous with  similar tricuspid regurgitation.   Risk Assessment/Calculations:    CHA2DS2-VASc  Score = 5   This indicates a 7.2% annual risk of stroke. The patient's score is based upon: CHF History: 1 HTN History: 1 Diabetes History: 1 Stroke History: 0 Vascular Disease History: 0 Age Score: 1 Gender Score: 1    Physical Exam:   VS:  BP 118/76   Pulse 84   Wt 169 lb (76.7 kg)   SpO2 97%   BMI 29.94 kg/m    Wt Readings from Last 3 Encounters:  05/30/23 169 lb (76.7 kg)  05/19/23 173 lb 9.6 oz (78.7 kg)  05/15/23 170 lb (77.1 kg)     GEN: Well nourished, well developed female appearing in no acute distress NECK: No JVD; Trach in place.  CARDIAC: Irregularly irregular, no murmurs, rubs, gallops RESPIRATORY:  Clear to auscultation without rales, wheezing or rhonchi  ABDOMEN: Appears non-distended. No obvious abdominal masses. EXTREMITIES: No clubbing or cyanosis. No pitting edema.  Distal pedal pulses are 2+ bilaterally. Varicose veins noted.    Assessment and Plan:   1. Chronic HFimpEF - Her ejection fraction was previously as low as 15-20% in 08/2020 with cardiac catheterization showing normal coronary arteries. This was at 45 to 50% by most recent imaging in 01/2021. She reports having possible upcoming surgery next year for dilation of her trachea and will obtain a follow-up echocardiogram for reassessment of her EF. She would not require further ischemic evaluation given prior normal cors by catheterization in 2022. - Continue current medical therapy including Jardiance 10 mg daily and Lopressor 25 mg twice daily. If EF remains reduced, would consider the addition of a low-dose ARB. Doubt BP would allow for Entresto. She has been feeling dehydrated with taking Lasix 40 mg daily as discussed above and has been taking 20 mg daily with stable weights.  Will update Rx to reflect 20 mg daily and we reviewed that she can take an extra 20 mg if needed for weight gain or worsening edema. Will adjust K-dur from 20 mEq daily to 10 mEq as well and we reviewed that she should take  an extra K-dur if she needs to take extra Lasix.  2. Persistent Atrial Fibrillation - Her heart rate is well-controlled in the 80's today. Continue Lopressor 25 mg twice daily for rate-control. - No reports of active bleeding. Remains on Eliquis 5 mg twice daily for anticoagulation which is the appropriate dose at this time given her age, weight and renal function.  3. RV Dysfunction/Pulmonary HTN - Echocardiogram in 01/2021 showed her RV function was moderately reduced with RVSP at 60.1 mmHg. Will obtain a follow-up echocardiogram as discussed above.  4. Chronic Hypoxic Respiratory Failure - She is followed by [facility] and has a tracheostomy in place.     Signed, Ellsworth Lennox, PA-C

## 2023-05-30 NOTE — Patient Instructions (Signed)
Medication Instructions:   DECREASE Lasix to 20 mg daily. You may take an extra dose as needed for swelling.  DECREASE Potassium to 10 meq daily.  Labwork: None today  Testing/Procedures: Your physician has requested that you have an echocardiogram. Echocardiography is a painless test that uses sound waves to create images of your heart. It provides your doctor with information about the size and shape of your heart and how well your heart's chambers and valves are working. This procedure takes approximately one hour. There are no restrictions for this procedure. Please do NOT wear cologne, perfume, aftershave, or lotions (deodorant is allowed). Please arrive 15 minutes prior to your appointment time.  Please note: We ask at that you not bring children with you during ultrasound (echo/ vascular) testing. Due to room size and safety concerns, children are not allowed in the ultrasound rooms during exams. Our front office staff cannot provide observation of children in our lobby area while testing is being conducted. An adult accompanying a patient to their appointment will only be allowed in the ultrasound room at the discretion of the ultrasound technician under special circumstances. We apologize for any inconvenience.   Follow-Up: 6 months  Any Other Special Instructions Will Be Listed Below (If Applicable).  If you need a refill on your cardiac medications before your next appointment, please call your pharmacy.

## 2023-05-30 NOTE — Telephone Encounter (Signed)
Viewed labs completed 11/20.  Hemoglobin was 10.8.  This is down from 11.6 on 10/30.  Has she had any recurrent rectal bleeding since I saw her in the office on 11/15?  Her hemoglobin isn't terribly low, but it has declined a bit since hospital discharge. Hard to say the exact cause of this. She has been wanting to hold off on a colonoscopy so far. We can continue to monitor if she would like to see if her hemoglobin will start to improve, or I can discuss with Dr. Marletta Lor and anesthesia about moving forward with a colonoscopy. Let me know what she prefers.

## 2023-05-30 NOTE — Telephone Encounter (Signed)
Spoke to pt's daughter (DPR) and she stated that pt's home health nurse did labs last week. There are labs under the [facility] tab from 05/24/2023

## 2023-05-30 NOTE — Telephone Encounter (Signed)
sent 

## 2023-05-31 NOTE — Telephone Encounter (Signed)
I agree this may be helpful. Recommend reaching out to hematology/oncology for possible iron infusion as they have given this to her in the past. We do not typically order IV iron, we defer to the hematologist who has more experience with it.

## 2023-05-31 NOTE — Telephone Encounter (Signed)
Spoke to pt's daughter [facility]) informed her of results and recommendations. She voiced understanding. She states that her mother has a minor procedure in January on vocal cords and is not sure if she wants her to have another so close to that one. She would like to know if she could do some iron infusions or something. She states you can send her a MyChart message.

## 2023-06-01 ENCOUNTER — Encounter: Payer: Self-pay | Admitting: Family Medicine

## 2023-06-05 ENCOUNTER — Telehealth: Payer: Self-pay

## 2023-06-05 ENCOUNTER — Other Ambulatory Visit: Payer: Self-pay | Admitting: Family Medicine

## 2023-06-05 IMAGING — DX DG CHEST 1V PORT
1 series · 1 of 1 positions shown · non-contrast
Comparison: 01/10/2021

CLINICAL DATA: Central line care exposed catheter not in original
position

EXAM:
PORTABLE CHEST 1 VIEW

[chest]
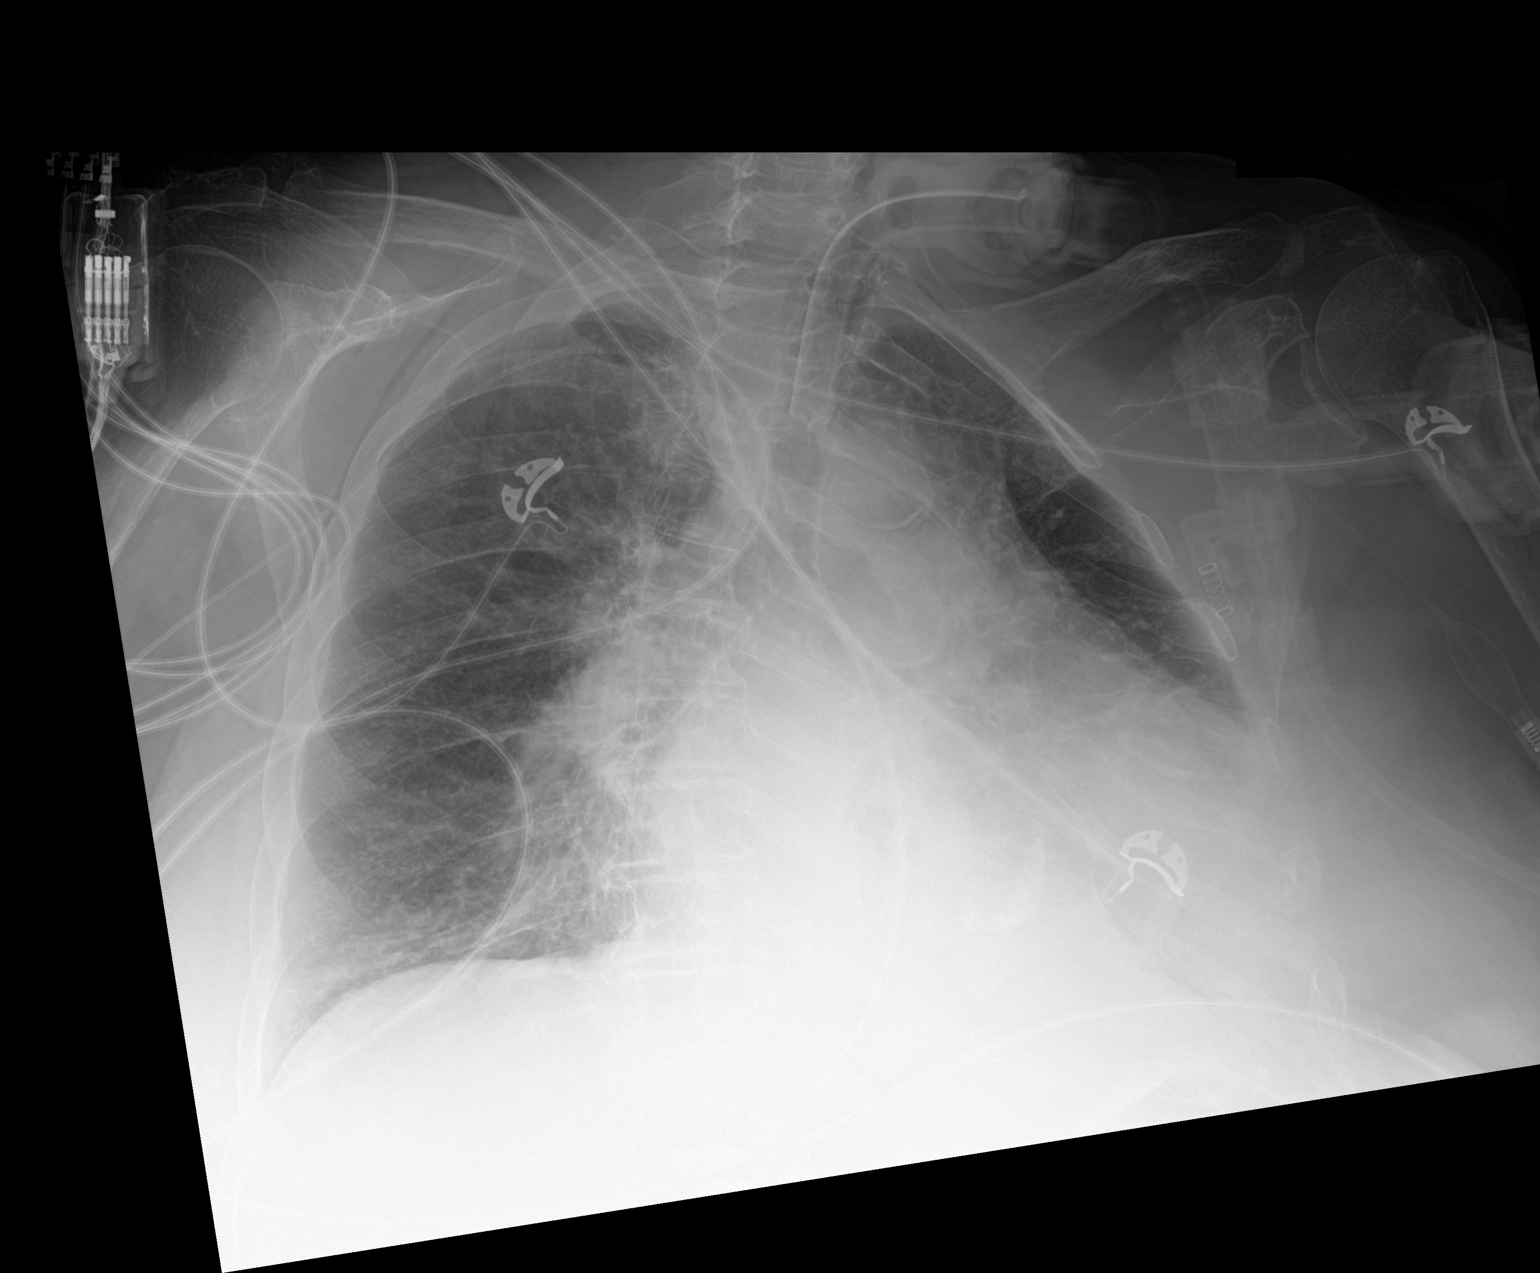

[1 of 1 positions shown; findings below may reference images not displayed]

FINDINGS: Marked patient rotation. Tracheostomy tube with tip about 4.1 cm
superior tip carina. Removal of previously noted esophageal tubes.
Right upper extremity central venous catheter has been retracted,
the tip is faintly visible over the brachiocephalic confluence.
Cardiomegaly with persistent airspace disease at left base.
IMPRESSION: 1. Right upper extremity central venous catheter tip is retracted,
tip faintly visible in the region of brachiocephalic confluence
allowing for marked patient rotation.
2. Cardiomegaly with persistent dense airspace disease at left base

## 2023-06-05 IMAGING — DX DG CHEST 1V PORT
1 series · 1 of 1 positions shown · non-contrast
Comparison: 01/18/2021 at [DATE] p.m.

CLINICAL DATA: PICC line placement

EXAM:
PORTABLE CHEST 1 VIEW

[chest ap]
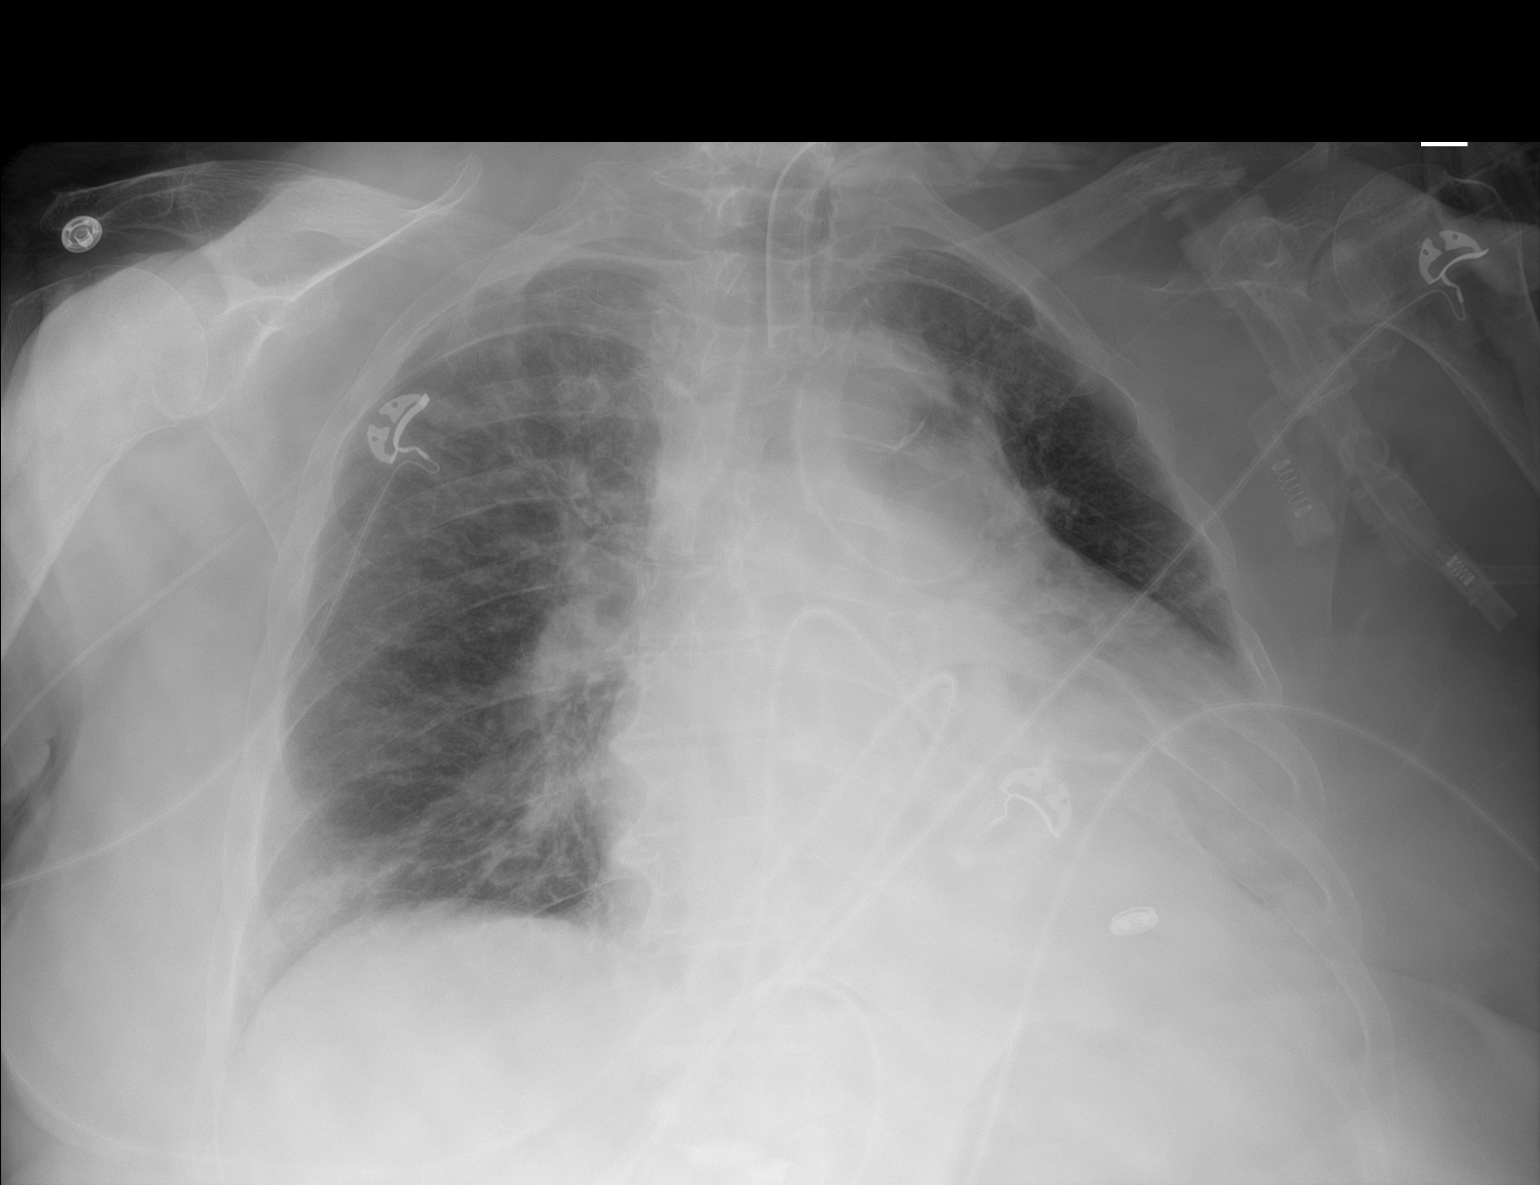

[1 of 1 positions shown; findings below may reference images not displayed]

FINDINGS: Single frontal view of the chest demonstrates tracheostomy tube
overlying tracheal air column unchanged. Right-sided PICC line tip
overlies superior vena cava. Cardiac silhouette remains enlarged.
Persistent left basilar consolidation unchanged. No large effusion
or pneumothorax.
IMPRESSION: 1. Right-sided PICC tip overlies superior vena cava.
2. Stable tracheostomy tube.
3. Persistent left basilar consolidation.

## 2023-06-05 MED ORDER — SERTRALINE HCL 50 MG PO TABS: 50.0000 mg | ORAL_TABLET | Freq: Every day | ORAL | 3 refills | Status: DC

## 2023-06-05 MED ORDER — BUSPIRONE HCL 30 MG PO TABS: 30.0000 mg | ORAL_TABLET | Freq: Two times a day (BID) | ORAL | 3 refills | Status: DC

## 2023-06-05 NOTE — Telephone Encounter (Signed)
NOT YET

## 2023-06-05 NOTE — Telephone Encounter (Signed)
Copied from CRM [id number]. Topic: Clinical - Home Health Verbal Orders >> Jun 05, 2023 12:20 PM Shelbie Proctor wrote: [name] [phone] ext [phone] Fax [phone]  Skilled nursing [phone] order number on 7 pages documents faxed on 05/29/23. They need the approval and signature the order is 24 days overdo.

## 2023-06-05 NOTE — Telephone Encounter (Signed)
Please inform the patient of the following updates to their medication plan:  Buspar has been increased to 30 mg twice daily. Zoloft has been increased to 50 mg once daily  I recently refilled Ativan 1 mg. The patient may take 1.5 tablets for anxiety instead of 1 tablet, as previously prescribed.

## 2023-06-06 NOTE — Telephone Encounter (Signed)
Noted  

## 2023-06-09 ENCOUNTER — Other Ambulatory Visit: Payer: Self-pay

## 2023-06-09 ENCOUNTER — Telehealth: Payer: Self-pay

## 2023-06-09 IMAGING — DX DG ABDOMEN 1V
1 series · 3 of 3 positions shown · non-contrast
Comparison: January 09, 2021.

CLINICAL DATA: Emesis.

EXAM:
ABDOMEN - 1 VIEW

[Series 1: abdomen · 0.14mm/px · 3 of 3 slices shown]
[im 1/3]
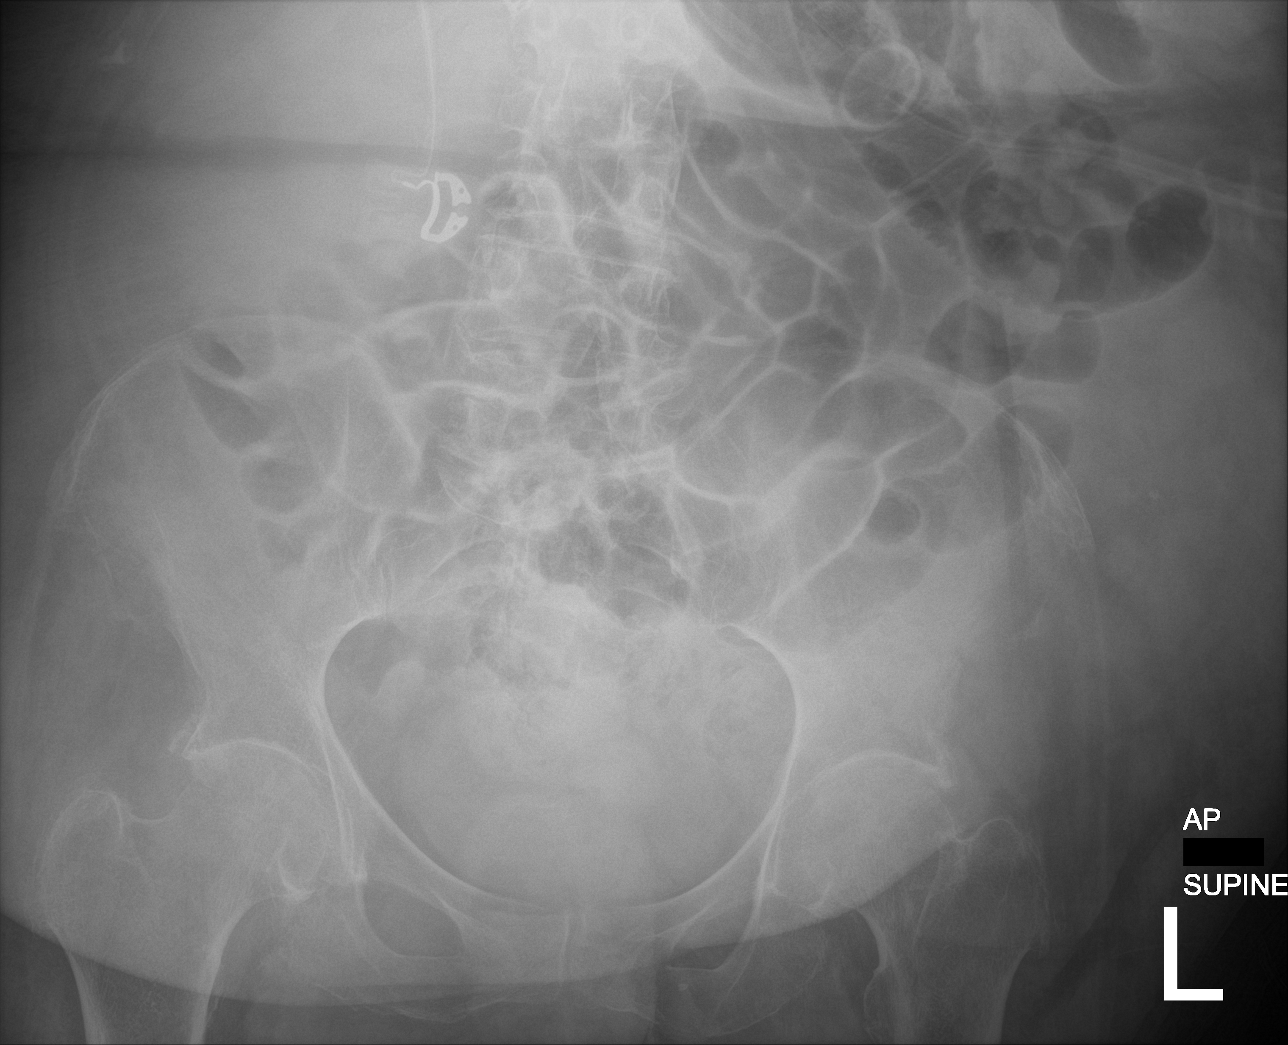
[im 2/3]
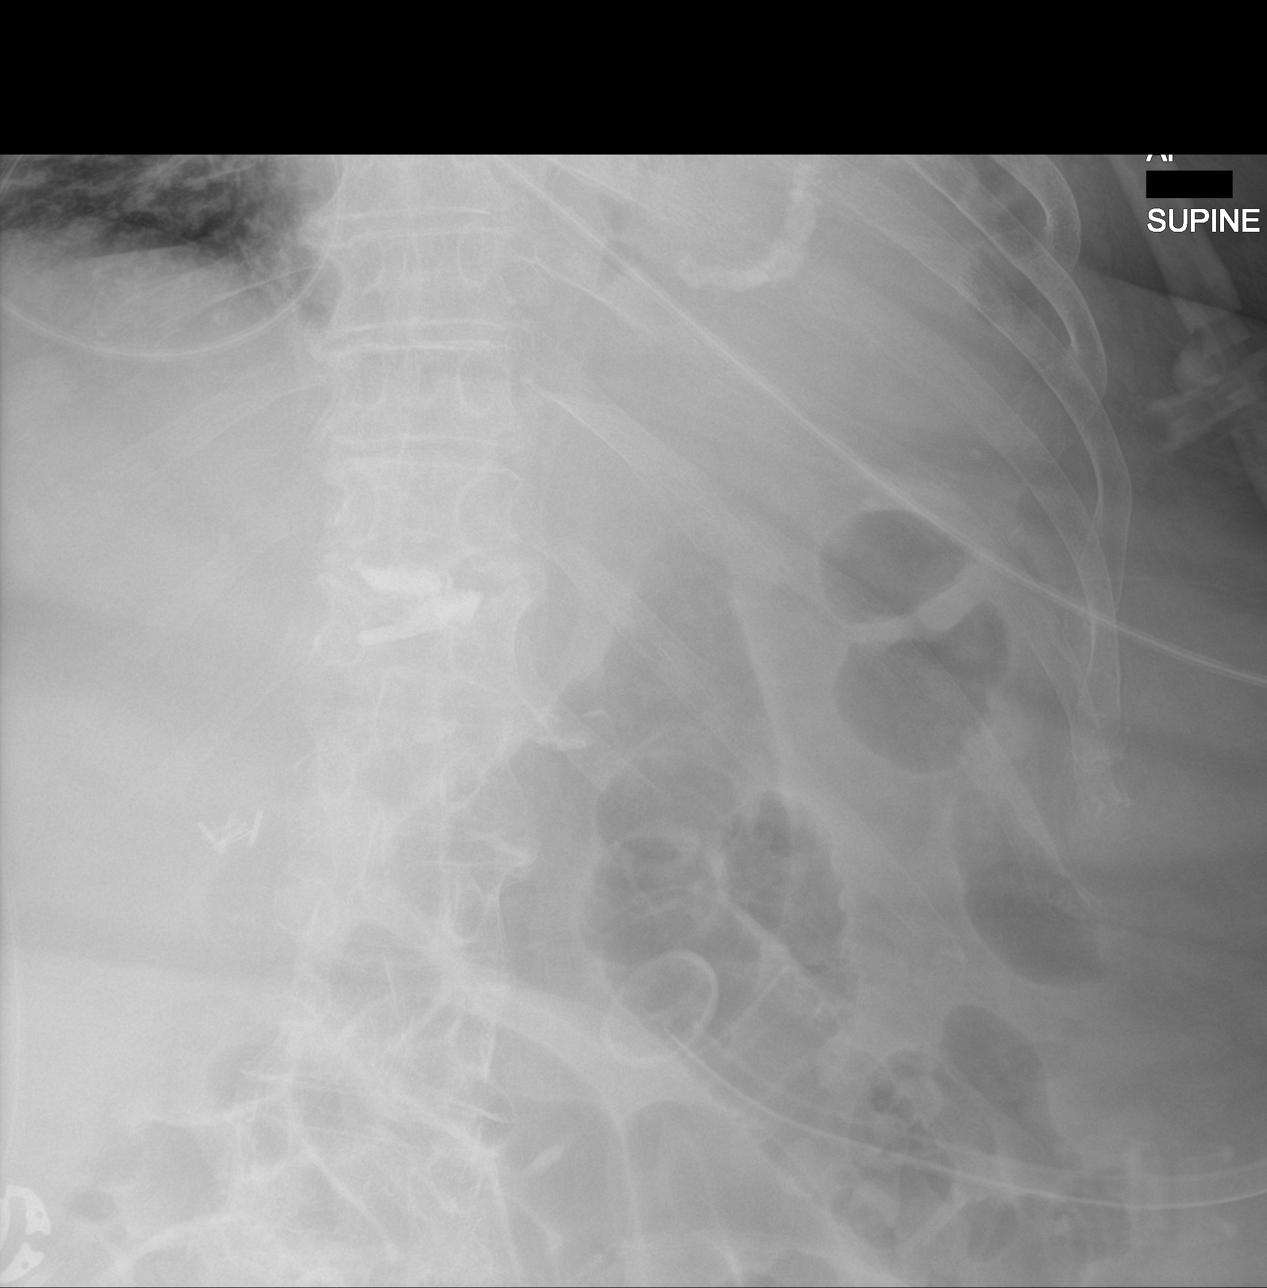
[im 3/3]
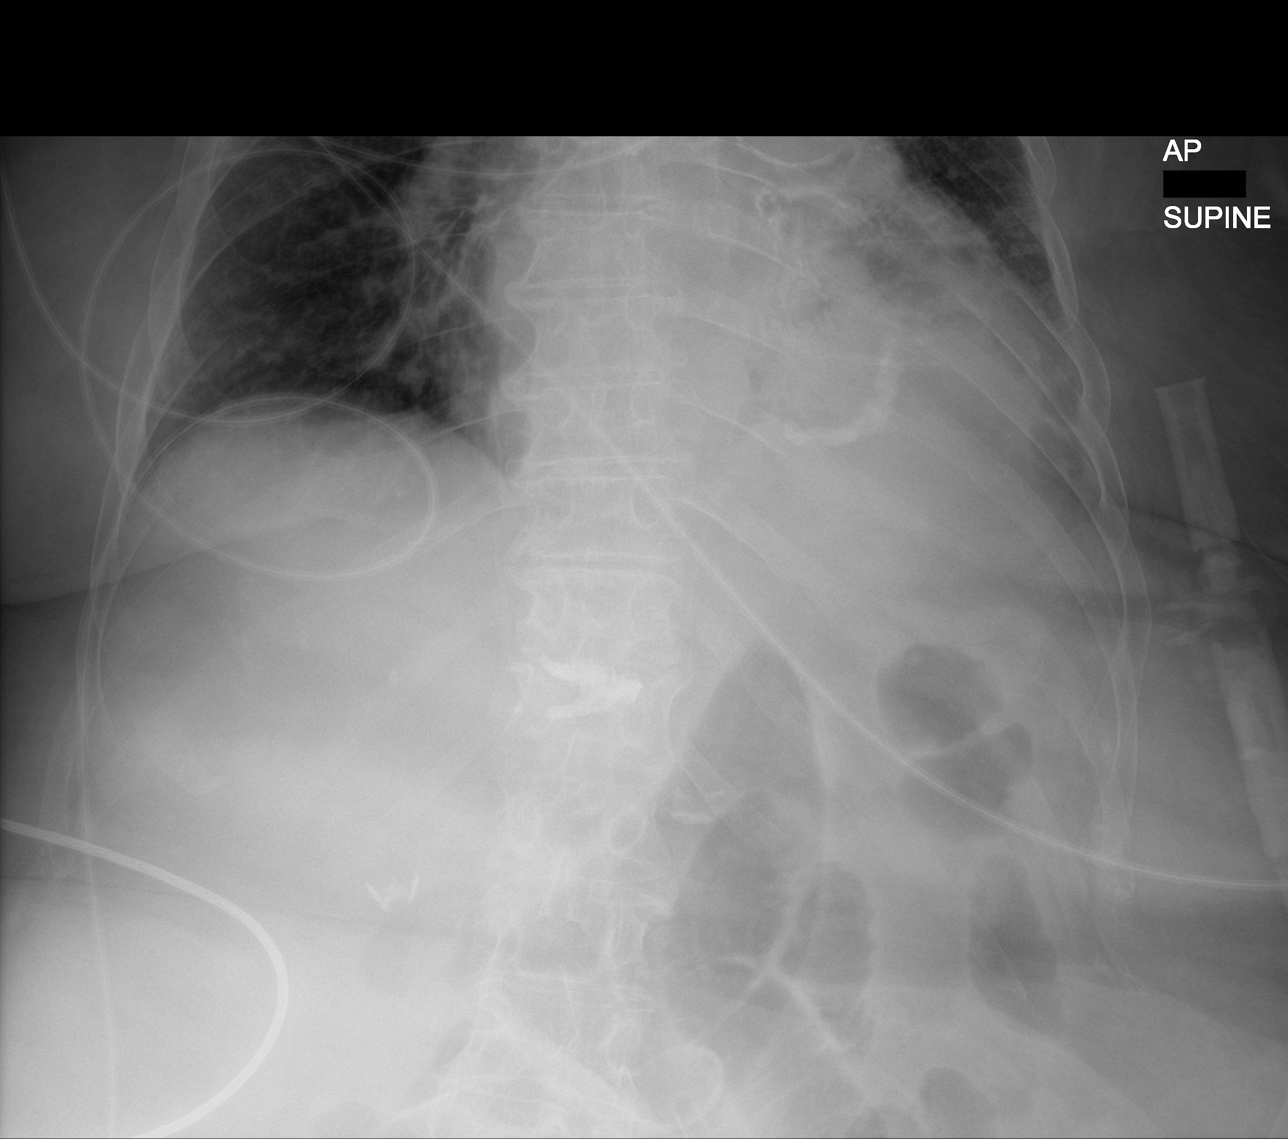

[3 of 3 positions shown; findings below may reference images not displayed]

FINDINGS: Mildly dilated small bowel loops are noted in left upper quadrant
suggesting possible ileus. Status post cholecystectomy. Gastrostomy
tube is seen in left upper quadrant. No radio-opaque calculi or
other significant radiographic abnormality are seen.
IMPRESSION: Mildly dilated small bowel loops are noted in left upper quadrant
suggesting possible ileus. Gastrostomy tube is noted.

## 2023-06-09 MED ORDER — LANTUS SOLOSTAR 100 UNIT/ML ~~LOC~~ SOPN: 20.0000 [IU] | PEN_INJECTOR | Freq: Every day | SUBCUTANEOUS | 4 refills | Status: DC

## 2023-06-09 MED ORDER — NOVOLOG FLEXPEN 100 UNIT/ML ~~LOC~~ SOPN: 0.0000 [IU] | PEN_INJECTOR | Freq: Three times a day (TID) | SUBCUTANEOUS | 4 refills | Status: AC

## 2023-06-09 NOTE — Telephone Encounter (Signed)
Copied from CRM [id number]. Topic: Clinical - Medication Refill >> Jun 09, 2023 10:06 AM Thomes Dinning wrote: Most Recent Primary Care Visit:  Provider: [facility]  Department: [facility]  Visit Type: HOSPITAL FU  Date: 05/15/2023  Medication:  insulin glargine (LANTUS SOLOSTAR) 100 UNIT/ML Solostar Pen insulin aspart (NOVOLOG FLEXPEN) 100 UNIT/ML FlexPen  Has the patient contacted their pharmacy? Yes (Agent: If no, request that the patient contact the pharmacy for the refill. If patient does not wish to contact the pharmacy document the reason why and proceed with request.) (Agent: If yes, when and what did the pharmacy advise?)  Is this the correct pharmacy for this prescription? Yes If no, delete pharmacy and type the correct one.  This is the patient's preferred pharmacy:  [facility] [place], [place] - [id number] [place] [address] Phone: [phone] Fax: [phone]   Has the prescription been filled recently? Yes  Is the patient out of the medication? Yes  Has the patient been seen for an appointment in the last year OR does the patient have an upcoming appointment? Yes  Can we respond through MyChart? No  Agent: Please be advised that Rx refills may take up to 3 business days. We ask that you follow-up with your pharmacy.

## 2023-06-09 NOTE — Telephone Encounter (Signed)
Refills sent

## 2023-06-10 IMAGING — DX DG CHEST 1V PORT
1 series · 2 of 2 positions shown · non-contrast
Comparison: 01/18/2021 chest radiograph.

CLINICAL DATA: Acute respiratory failure, altered mental status

EXAM:
PORTABLE CHEST 1 VIEW

[Series 1: chest · 0.14mm/px · 2 of 2 slices shown]
[im 1/2]
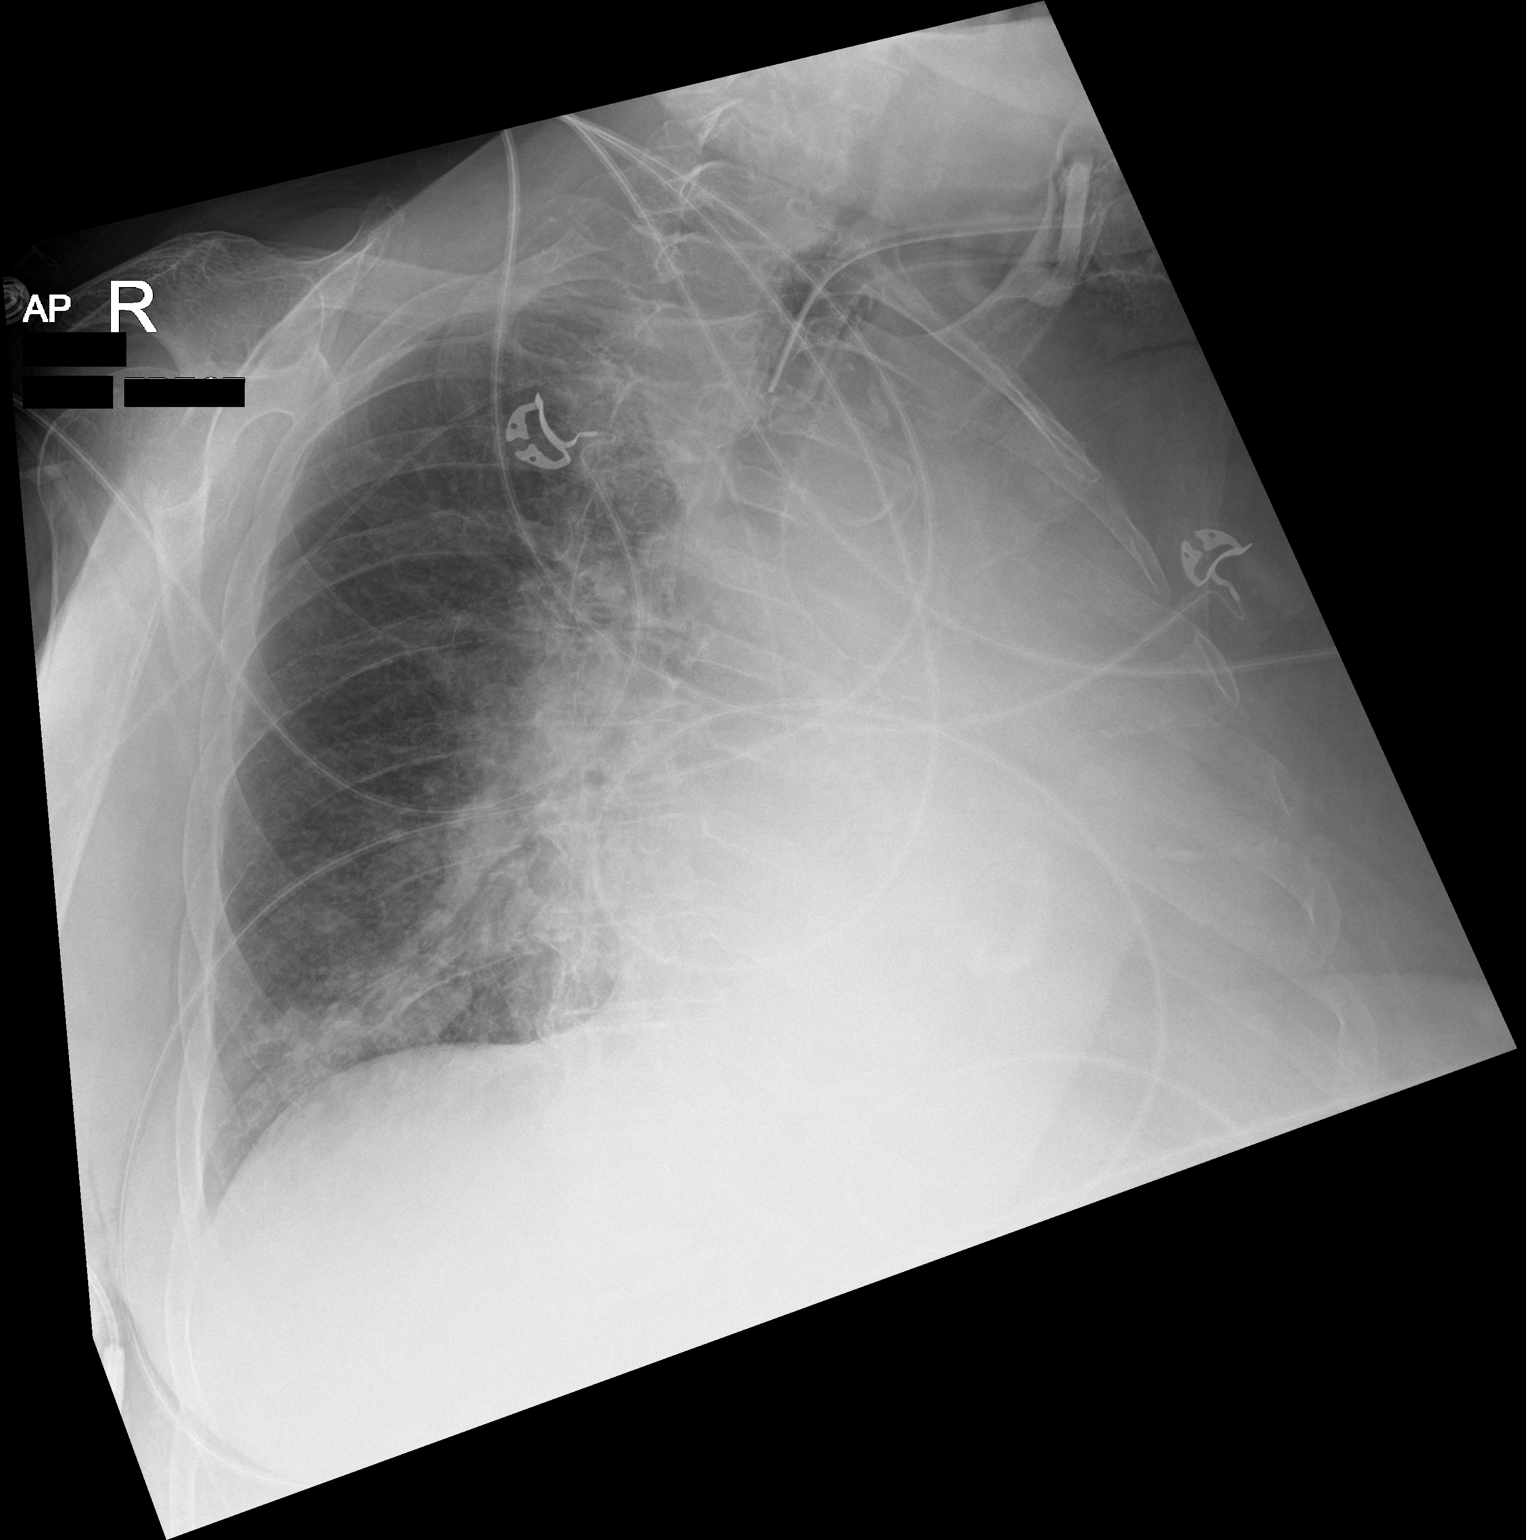
[im 2/2]
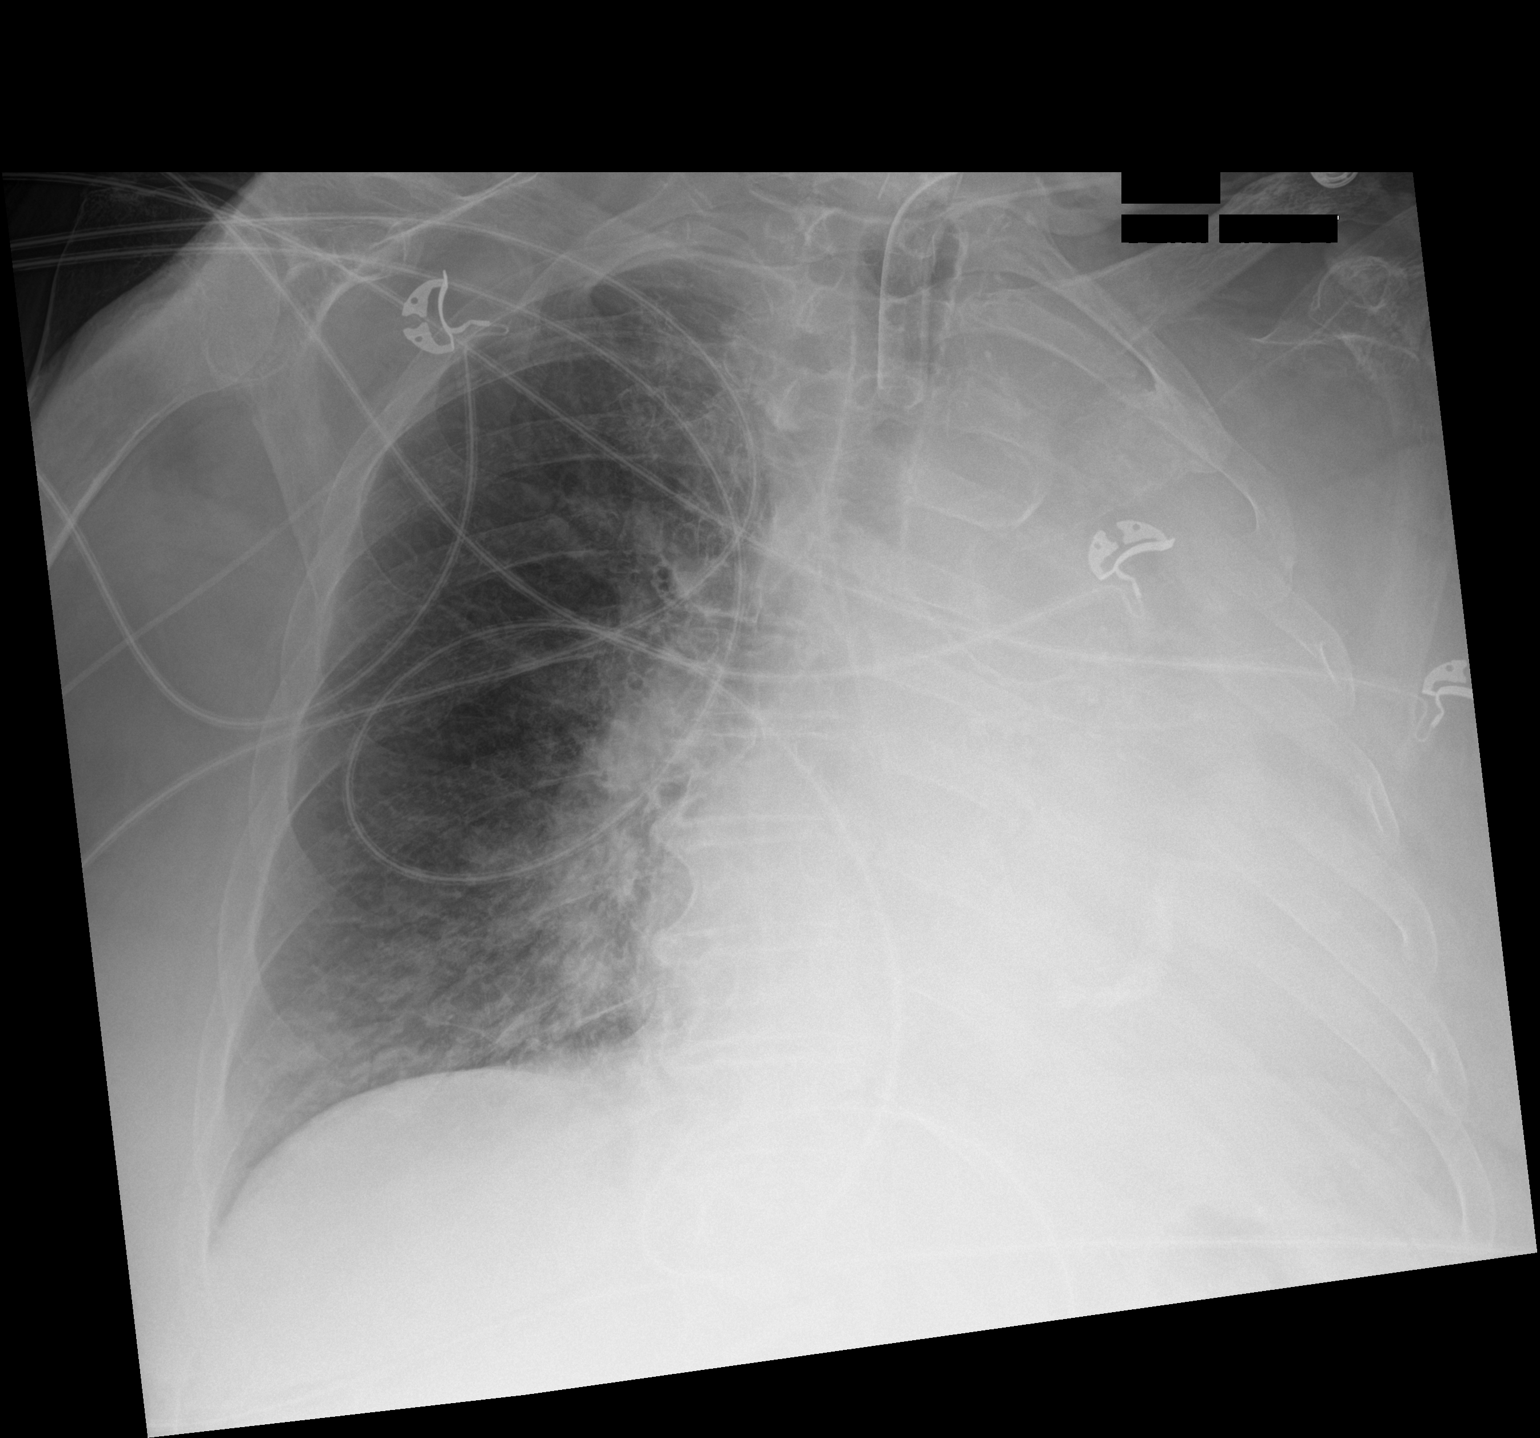

[2 of 2 positions shown; findings below may reference images not displayed]

FINDINGS: Tracheostomy tube tip overlies the tracheal air column at the
thoracic inlet. Right PICC terminates over the cavoatrial junction.
New complete opacification of the left hemithorax with associated
volume loss and left mediastinal shift. Stable cardiomegaly. No
pneumothorax. No right pleural effusion. Similar right basilar hazy
opacity. No pulmonary edema.
IMPRESSION: 1. New complete opacification of the left hemithorax with associated
volume loss, compatible with complete left lung atelectasis,
probably due to mucoid impaction given the rapid change.
2. Similar right basilar hazy opacity, favor atelectasis.

## 2023-06-11 IMAGING — DX DG CHEST 1V PORT
1 series · 1 of 1 positions shown · non-contrast
Comparison: Chest radiograph from one day prior.

CLINICAL DATA: Acute respiratory failure

EXAM:
PORTABLE CHEST 1 VIEW

[chest]
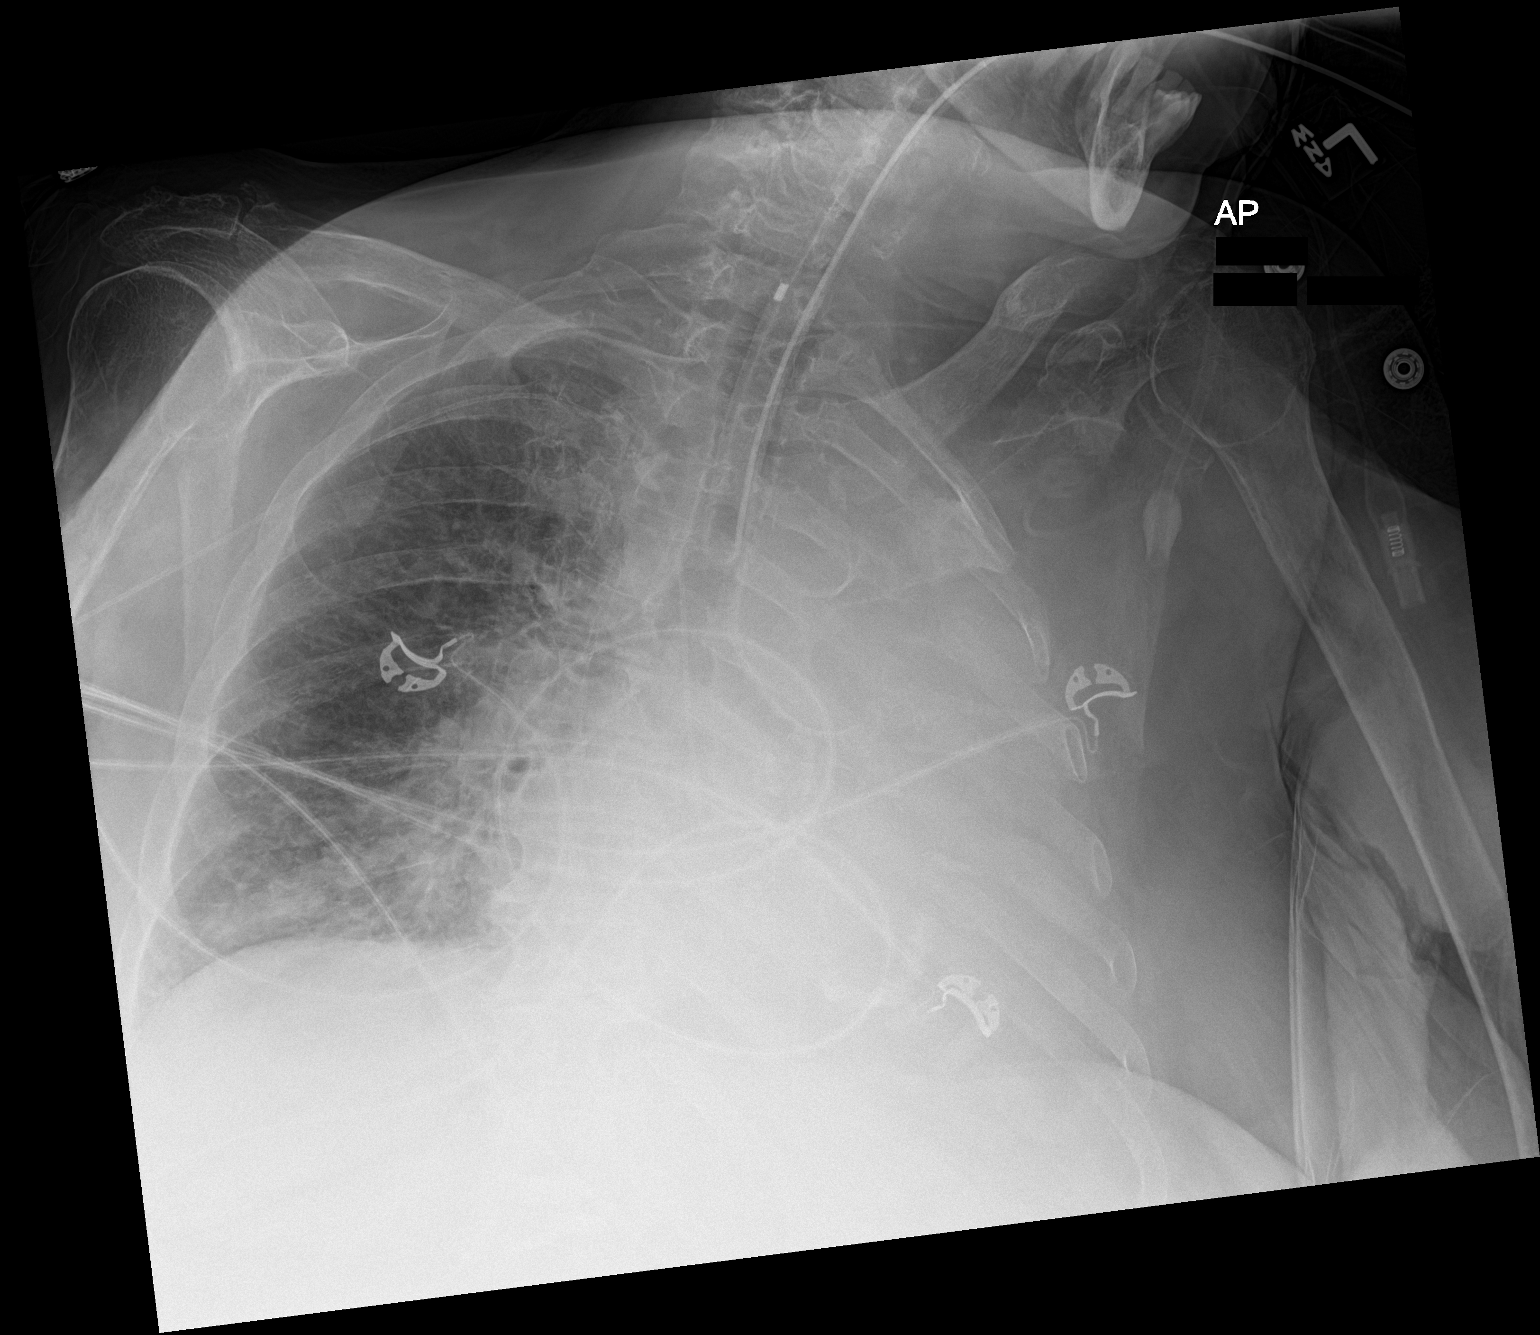

[1 of 1 positions shown; findings below may reference images not displayed]

FINDINGS: Endotracheal tube tip is 2.9 cm above the carina. Right PICC
terminates over the cavoatrial junction. Persistent complete
opacification of the left hemithorax with left mediastinal shift. No
pneumothorax. No right pleural effusion. Similar hazy right lung
base opacity.
IMPRESSION: 1. Well-positioned support structures.
2. Persistent complete opacification of the left hemithorax with
left mediastinal shift, compatible with atelectasis and/or
pneumonia.
3. Similar hazy right lung base opacity, favor atelectasis.

## 2023-06-12 IMAGING — DX DG CHEST 1V PORT
2 series · 2 of 2 positions shown · non-contrast
Comparison: 01/24/2021

CLINICAL DATA: Acute respiratory failure

EXAM:
PORTABLE CHEST 1 VIEW

[chest ap (1 of 2)]
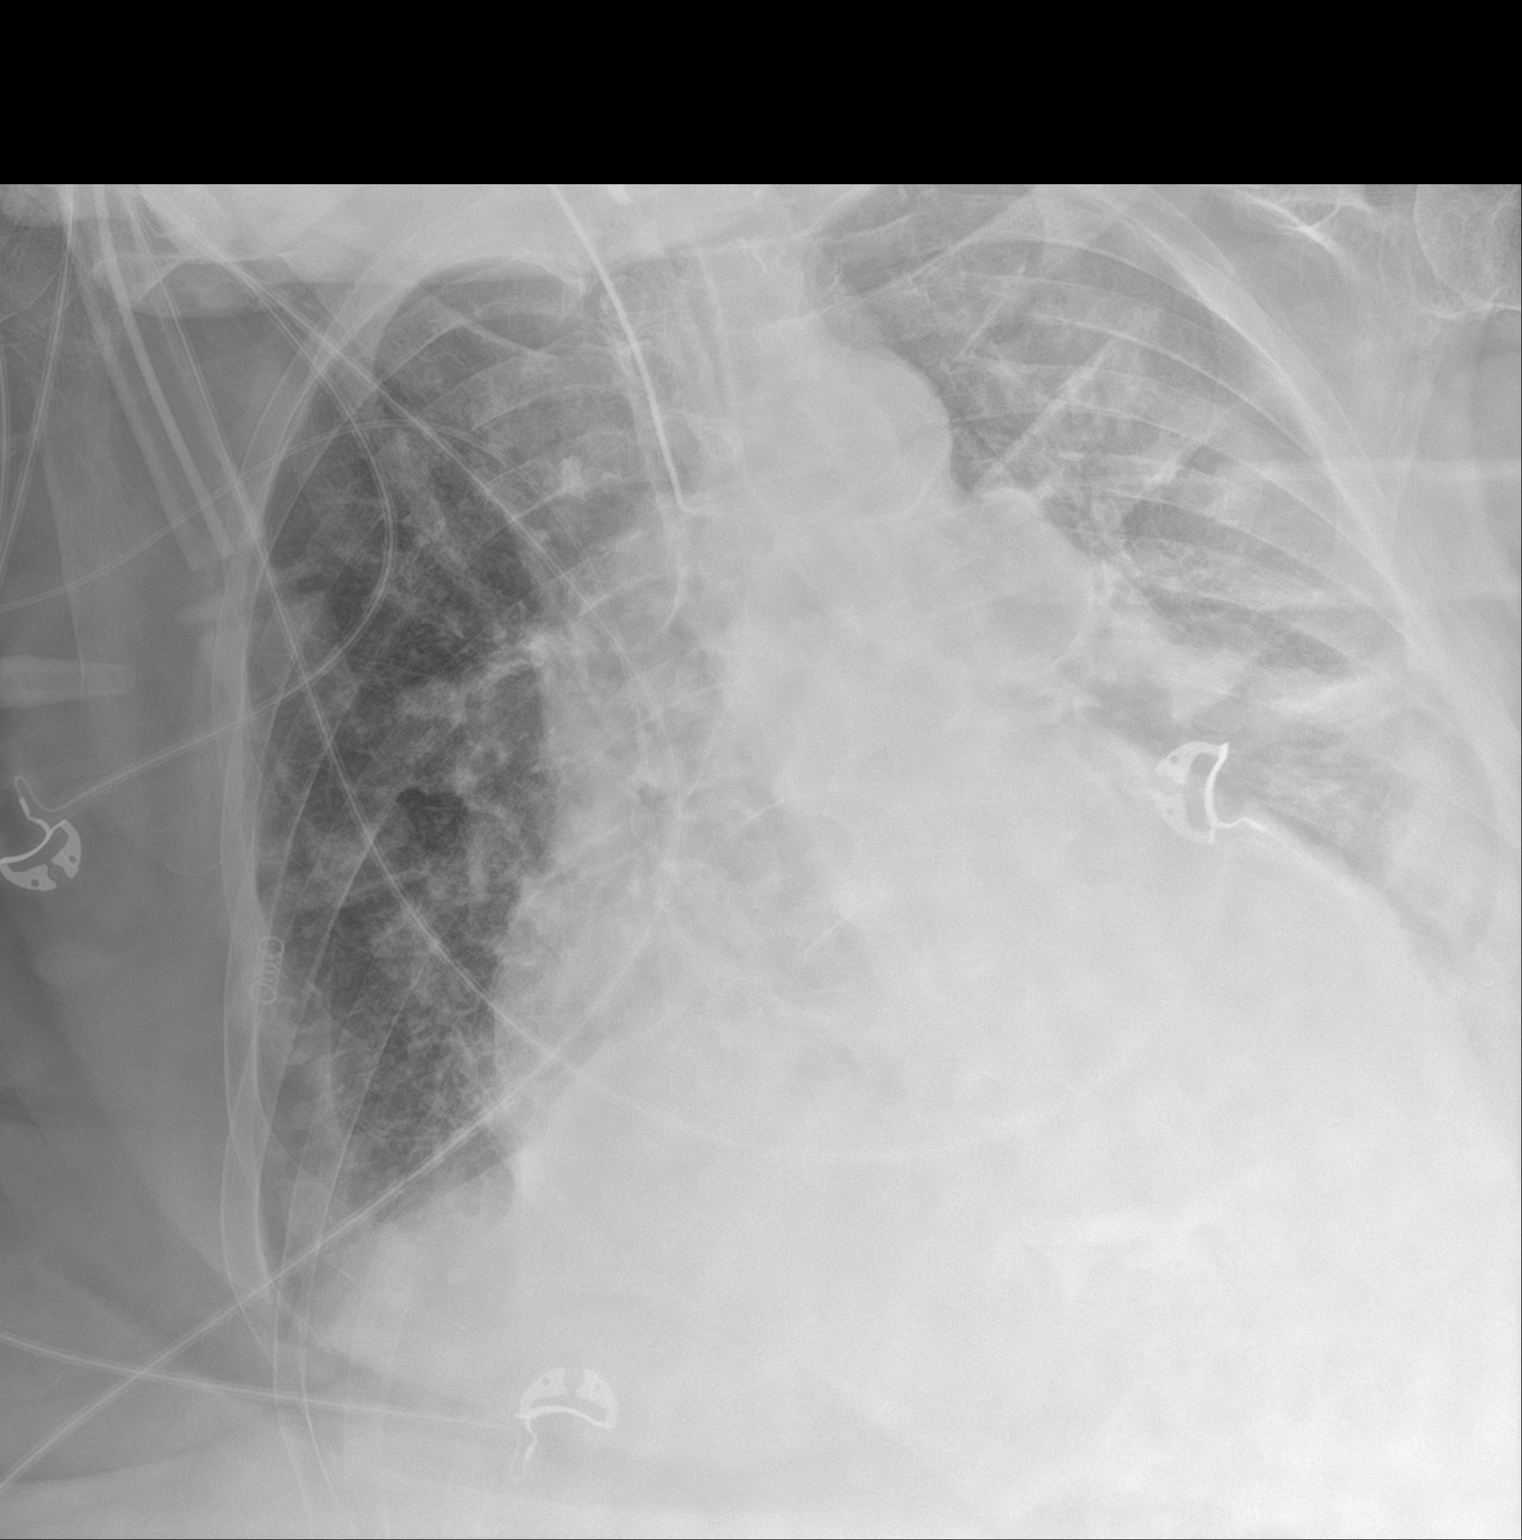

[chest ap (2 of 2)]
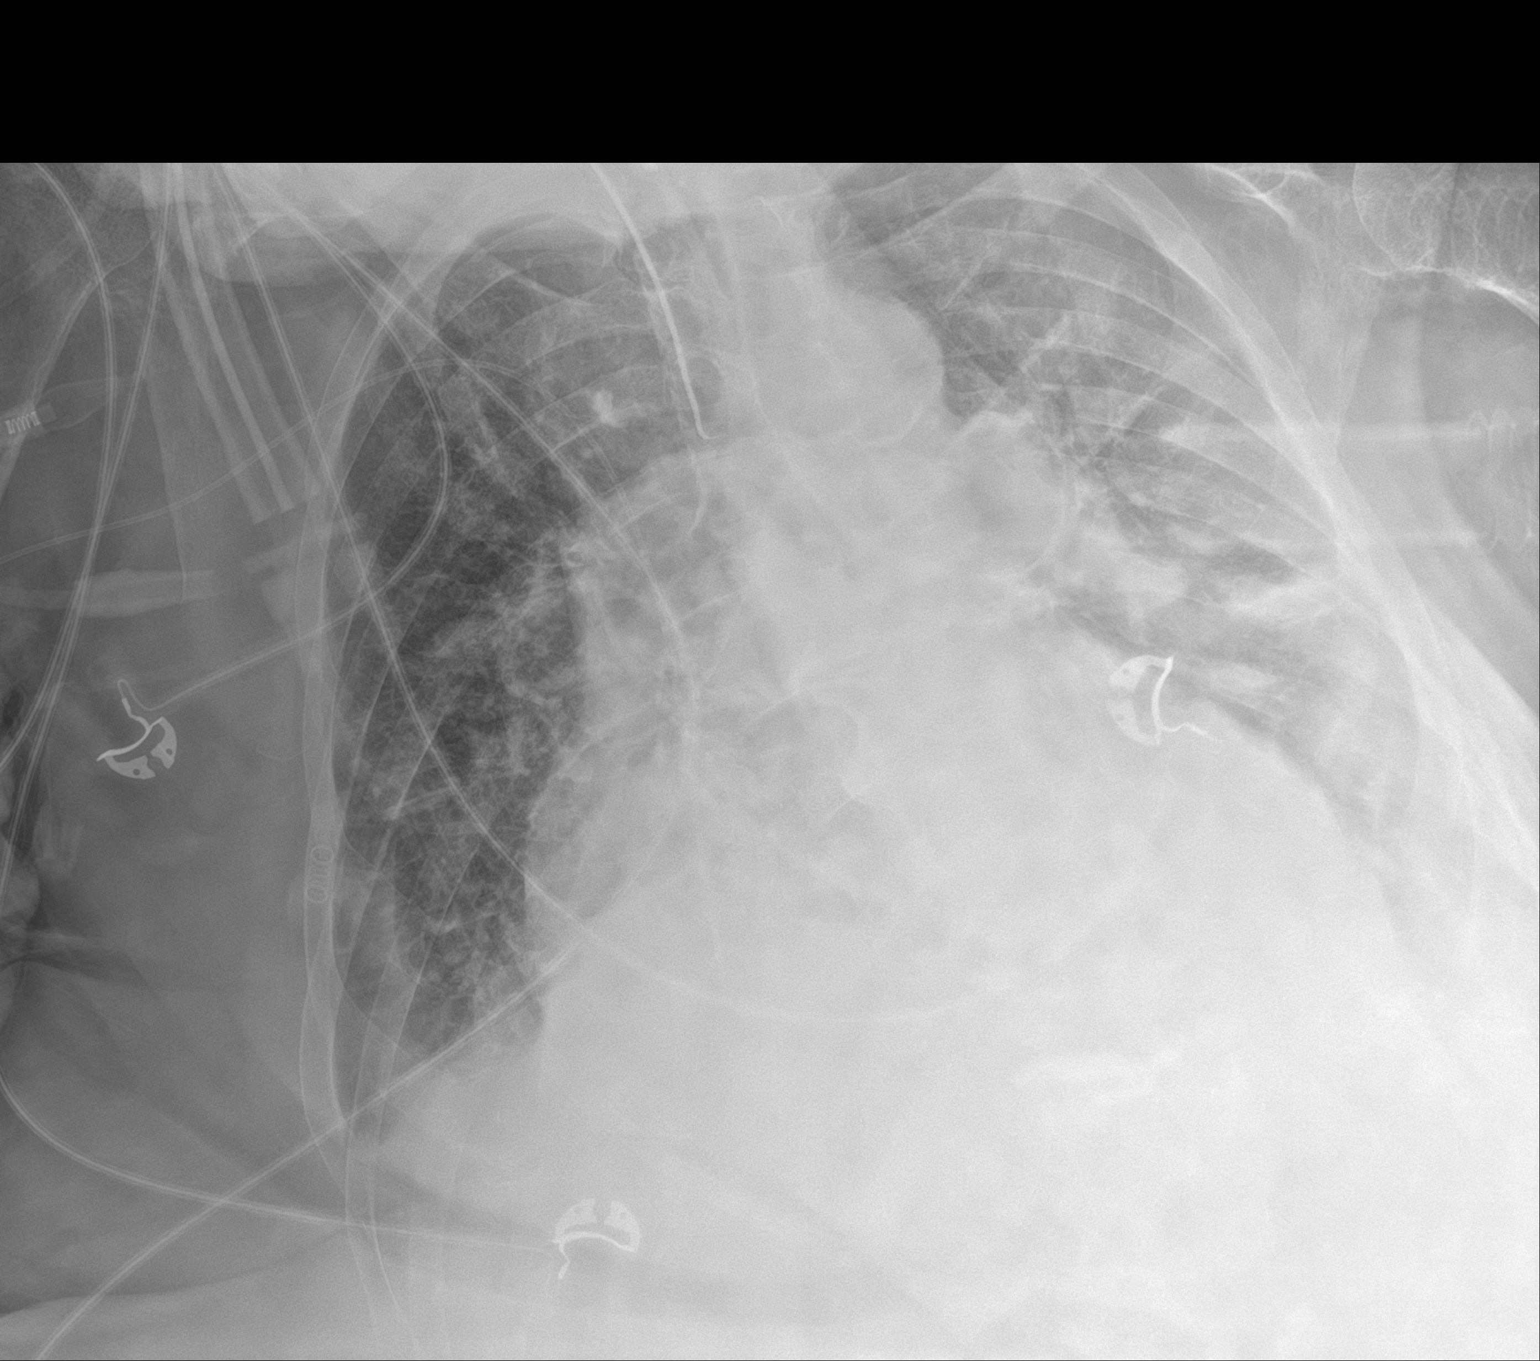

[2 of 2 positions shown; findings below may reference images not displayed]

FINDINGS: Endotracheal tube is approximately 2 cm above the carina. Unchanged
right PICC line. Improved aeration of the left lung. Remains patchy
opacities bilaterally. Probable small pleural effusions.
Cardiomegaly. No pneumothorax.
IMPRESSION: Improved aeration of the left lung. Remains patchy
atelectasis/consolidation bilaterally.

Cardiomegaly.

## 2023-06-13 ENCOUNTER — Other Ambulatory Visit: Payer: Self-pay | Admitting: Family Medicine

## 2023-06-13 IMAGING — DX DG CHEST 1V PORT
1 series · 1 of 1 positions shown · non-contrast
Comparison: 01/25/2021.

CLINICAL DATA: Hemoptysis.

EXAM:
PORTABLE CHEST 1 VIEW

[chest]
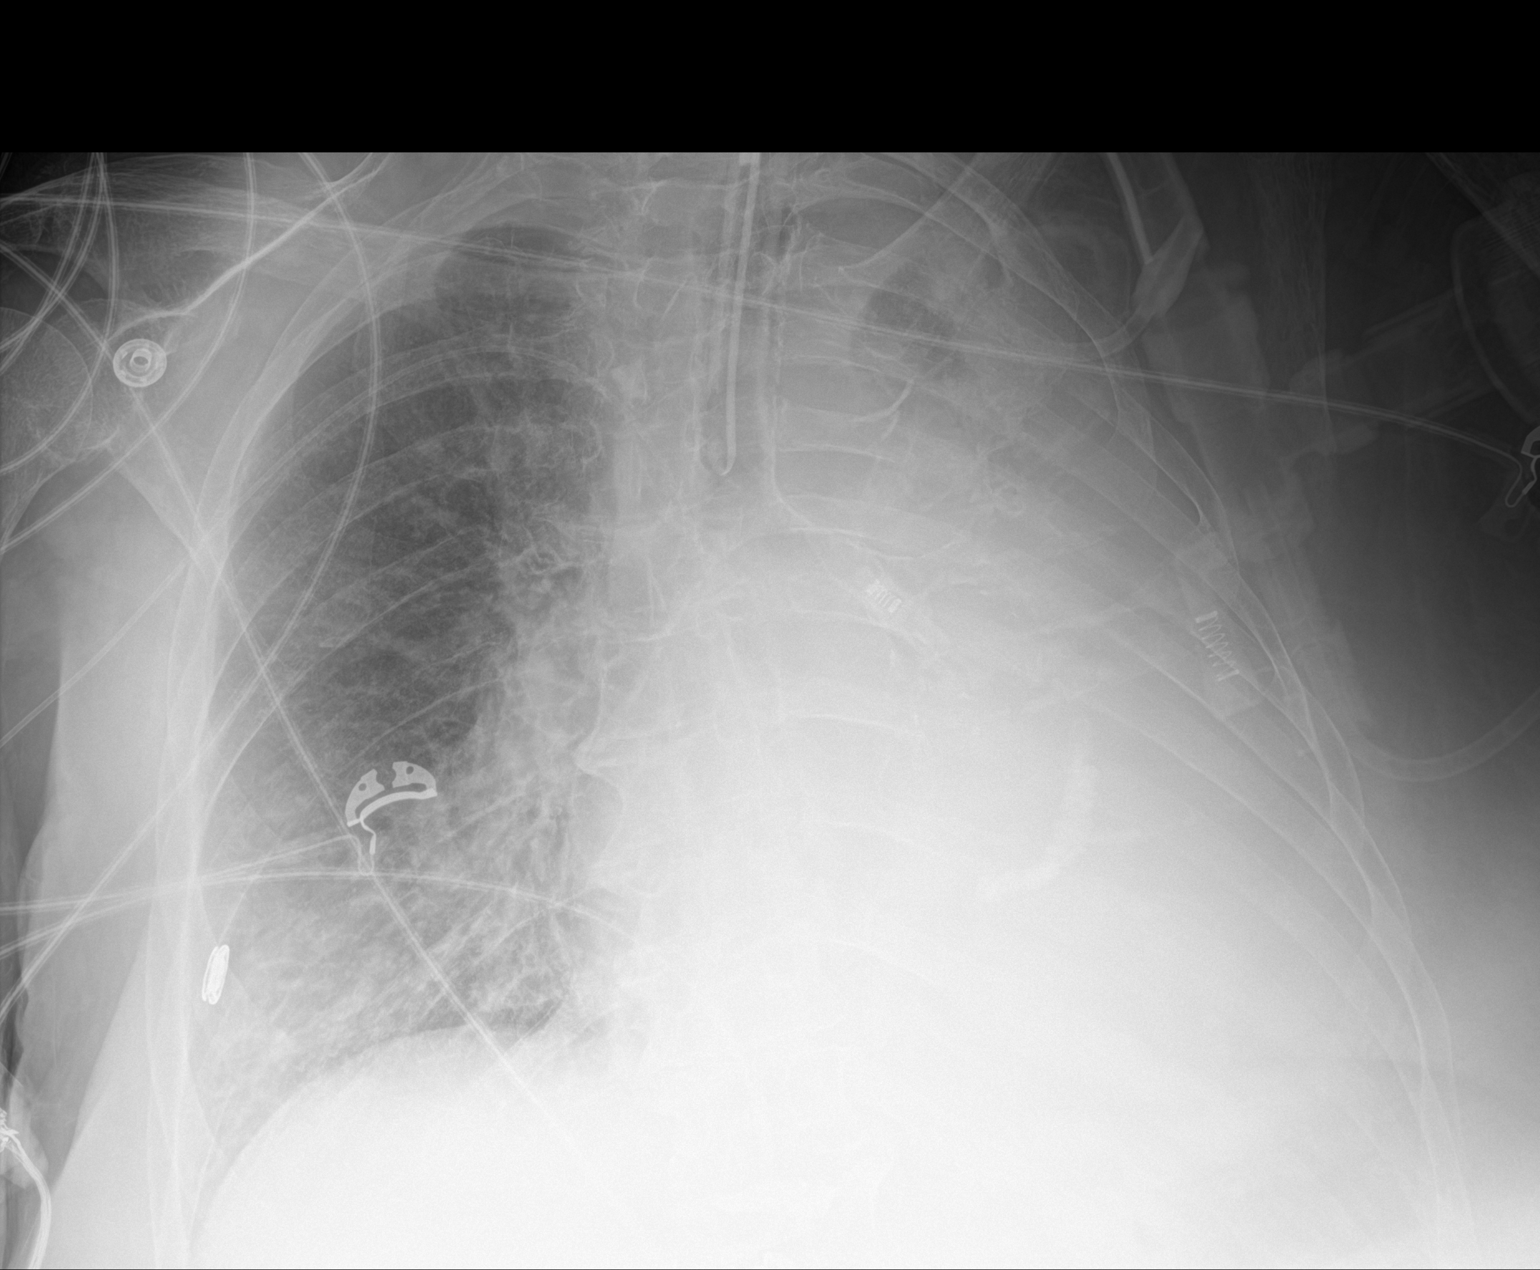

[1 of 1 positions shown; findings below may reference images not displayed]

FINDINGS: Endotracheal tube tip noted 1.5 cm above the lower portion of the
carina. Proximal repositioning of approximately 2 cm should be
considered. Near complete opacification of the left hemithorax
noted. This is most likely secondary to large left pleural effusion
with probable underlying infiltrate. Mild right base infiltrate
cannot be excluded. No right pleural effusion. No pneumothorax.
Heart size difficult to evaluate due to left hemithorax
opacification. Mitral annular calcification.
IMPRESSION: 1. Endotracheal tube tip noted 1.5 cm above the lower portion of the
carina. Proximal repositioning of approximately 2 cm should be
considered.

2. Near complete opacification of left hemithorax noted. This is
most likely secondary to large left pleural effusion with probable
underlying infiltrate. Mild right base infiltrate cannot be
excluded.

## 2023-06-14 ENCOUNTER — Encounter: Payer: Self-pay | Admitting: Neurology

## 2023-06-14 IMAGING — DX DG CHEST 1V PORT
1 series · 1 of 1 positions shown · non-contrast
Comparison: 01/26/2021

CLINICAL DATA: Tracheostomy placement

EXAM:
PORTABLE CHEST 1 VIEW

[chest ap]
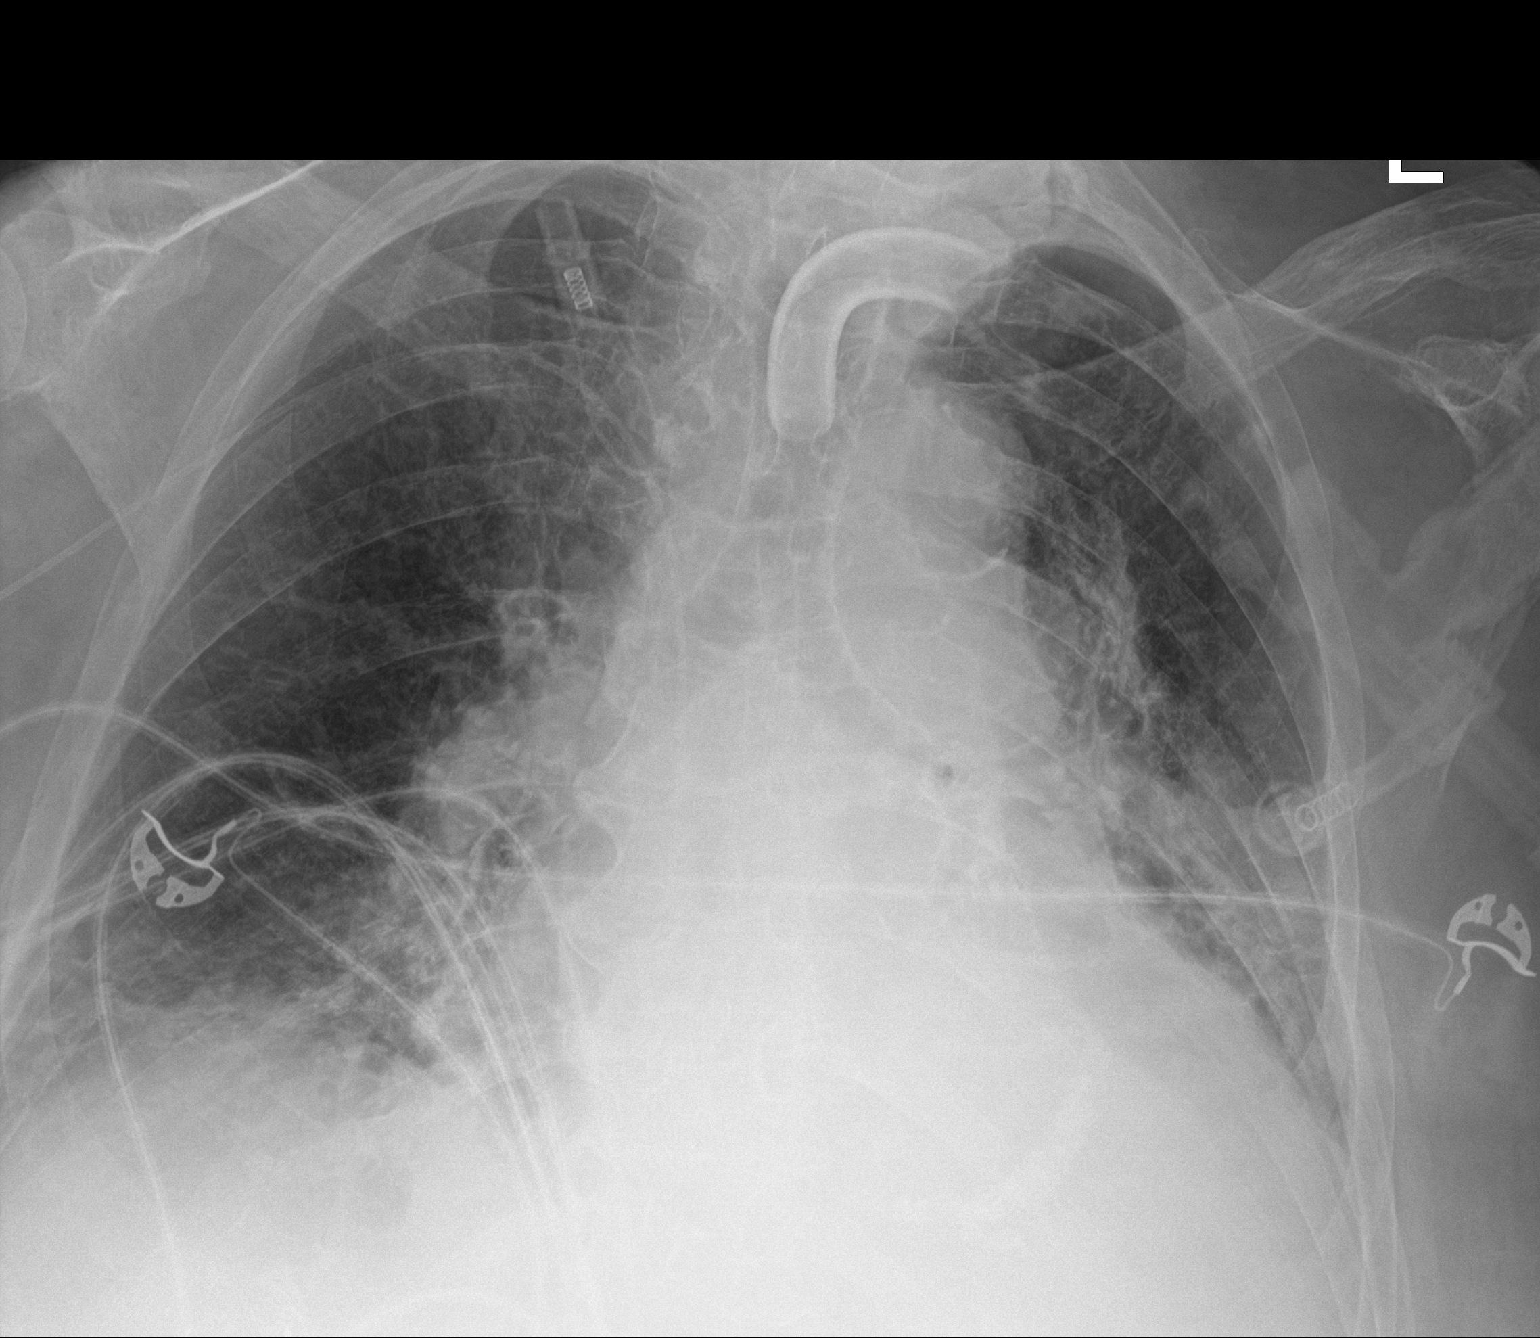

[1 of 1 positions shown; findings below may reference images not displayed]

FINDINGS: Cardiomegaly. Right upper extremity PICC. Interval placement of
tracheostomy, appliance tip projecting in the mid trachea. Small,
layering bilateral pleural effusions. The visualized skeletal
structures are unremarkable.
IMPRESSION: 1. Interval placement of tracheostomy, appliance tip projecting in
the mid trachea.
2. Small, layering bilateral pleural effusions.
3. Cardiomegaly.

## 2023-06-16 ENCOUNTER — Ambulatory Visit (HOSPITAL_COMMUNITY)
Admission: RE | Admit: 2023-06-16 | Discharge: 2023-06-16 | Disposition: A | Discharge status: Home / Self Care | Payer: PPO | Source: Ambulatory Visit | Attending: Student | Admitting: Student

## 2023-06-16 DIAGNOSIS — I5032 Chronic diastolic (congestive) heart failure: Secondary | ICD-10-CM | POA: Diagnosis present

## 2023-06-16 LAB — ECHOCARDIOGRAM COMPLETE
AR max vel: 2.7 cm2
AV Area VTI: 2.99 cm2
AV Area mean vel: 2.88 cm2
AV Mean grad: 3 mm[Hg]
AV Peak grad: 6.5 mm[Hg]
Ao pk vel: 1.27 m/s
Area-P 1/2: 4.39 cm2
Calc EF: 50.4 %
MV VTI: 2.19 cm2
P 1/2 time: 566 ms
S' Lateral: 2.9 cm
Single Plane A2C EF: 54.3 %
Single Plane A4C EF: 50.4 %

## 2023-06-21 ENCOUNTER — Ambulatory Visit (HOSPITAL_COMMUNITY)
Admission: RE | Admit: 2023-06-21 | Discharge: 2023-06-21 | Disposition: A | Discharge status: Home / Self Care | Payer: PPO | Source: Ambulatory Visit | Attending: Acute Care | Admitting: Acute Care

## 2023-06-21 ENCOUNTER — Other Ambulatory Visit: Payer: Self-pay

## 2023-06-21 DIAGNOSIS — Z930 Tracheostomy status: Secondary | ICD-10-CM

## 2023-06-21 DIAGNOSIS — R491 Aphonia: Secondary | ICD-10-CM | POA: Diagnosis not present

## 2023-06-21 DIAGNOSIS — J386 Stenosis of larynx: Secondary | ICD-10-CM | POA: Insufficient documentation

## 2023-06-21 DIAGNOSIS — D508 Other iron deficiency anemias: Secondary | ICD-10-CM

## 2023-06-21 DIAGNOSIS — J9612 Chronic respiratory failure with hypercapnia: Secondary | ICD-10-CM | POA: Insufficient documentation

## 2023-06-21 DIAGNOSIS — K3184 Gastroparesis: Secondary | ICD-10-CM | POA: Diagnosis not present

## 2023-06-21 DIAGNOSIS — I2721 Secondary pulmonary arterial hypertension: Secondary | ICD-10-CM | POA: Diagnosis not present

## 2023-06-21 DIAGNOSIS — D500 Iron deficiency anemia secondary to blood loss (chronic): Secondary | ICD-10-CM

## 2023-06-21 DIAGNOSIS — G4733 Obstructive sleep apnea (adult) (pediatric): Secondary | ICD-10-CM | POA: Diagnosis not present

## 2023-06-21 DIAGNOSIS — E662 Morbid (severe) obesity with alveolar hypoventilation: Secondary | ICD-10-CM | POA: Diagnosis present

## 2023-06-21 DIAGNOSIS — J9611 Chronic respiratory failure with hypoxia: Secondary | ICD-10-CM | POA: Insufficient documentation

## 2023-06-21 DIAGNOSIS — Z9911 Dependence on respirator [ventilator] status: Secondary | ICD-10-CM | POA: Diagnosis not present

## 2023-06-21 DIAGNOSIS — I2723 Pulmonary hypertension due to lung diseases and hypoxia: Secondary | ICD-10-CM | POA: Diagnosis not present

## 2023-06-21 DIAGNOSIS — Z430 Encounter for attention to tracheostomy: Secondary | ICD-10-CM | POA: Diagnosis present

## 2023-06-21 IMAGING — DX DG CHEST 1V PORT
1 series · 1 of 1 positions shown · non-contrast
Comparison: 01/28/2021.

CLINICAL DATA: Hypoxia.

EXAM:
PORTABLE CHEST 1 VIEW

[chest]
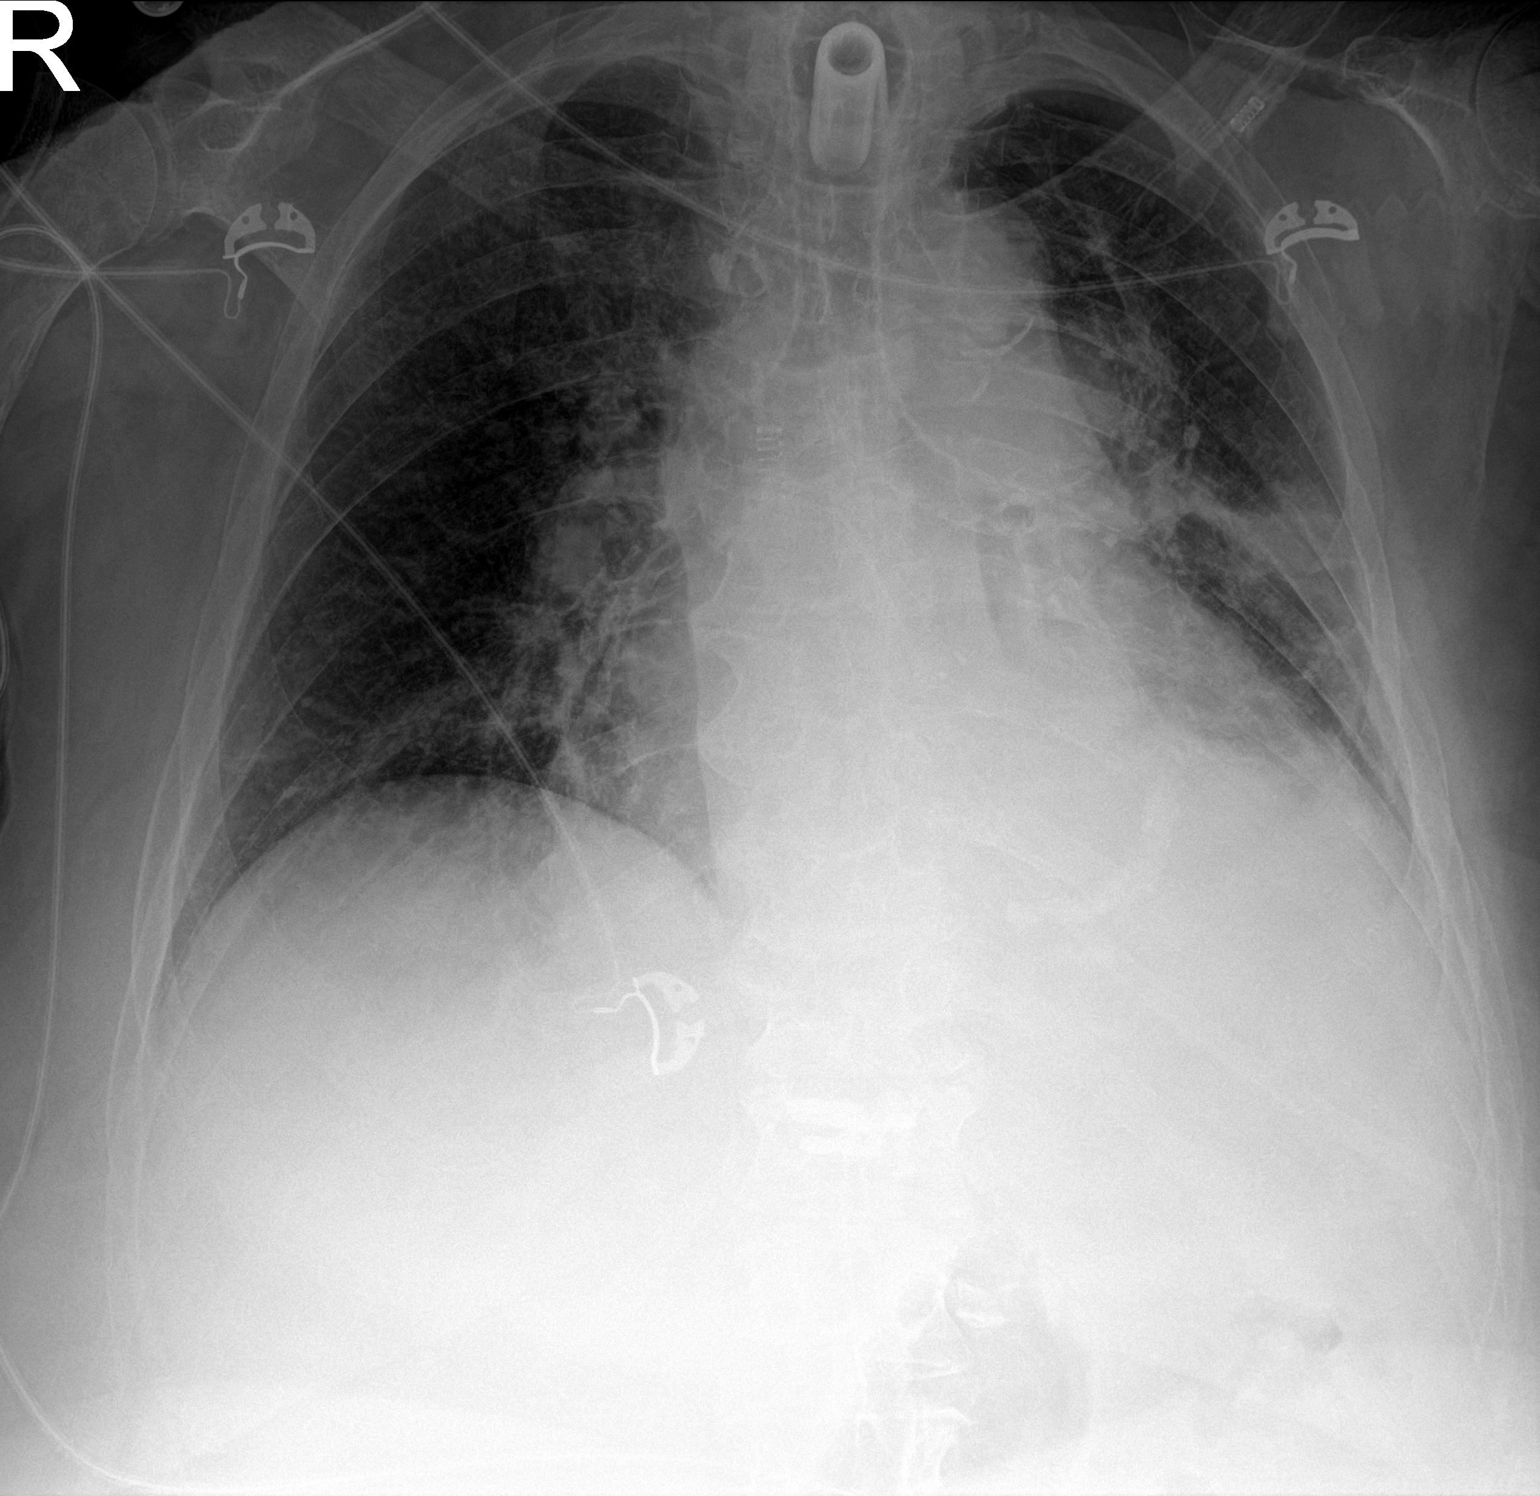

[1 of 1 positions shown; findings below may reference images not displayed]

FINDINGS: Tracheostomy tube in stable position. Mitral annular calcification.
Stable prominent cardiomegaly. Persistent left mid and lower left
lung atelectasis/infiltrates. No interim change. No prominent
pleural effusion. No pneumothorax.
IMPRESSION: 1.  Tracheostomy tube in stable position.

2.  Persistent prominent cardiomegaly.

3. Persistent left mid and lower left lung atelectasis/infiltrates.
No interim change.

## 2023-06-21 NOTE — Progress Notes (Signed)
Tracheostomy Procedure Note  ANGLE DOTO [id number] 28-Mar-1951  Pre Procedure Tracheostomy Information  Trach Brand: Shiley Size:  #[id number] Style: Cuffed Secured by: Sutures   Procedure: Trach change    Post Procedure Tracheostomy Information  Trach BrandVassie Loll Size:  [id number] Style: Cuffed Secured by: Sutures   Post Procedure Evaluation:  ETCO2 positive color change from yellow to purple : Yes.   Vital signs: stable. Patients current condition: stable Complications: No apparent complications Trach site exam: clean, dry, no drainage Wound care done: dry, sterile, clean, and 4 x 4 gauze Patient did tolerate procedure well.   Education: None  Prescription needs: None    Additional needs: Lidocaine Nebulizer given pre and post trach change.

## 2023-06-21 NOTE — Progress Notes (Signed)
Reason for visit Planned trach change  HPI 72 year old pt well known to me. Follow her for trach dependence for OHS,OSA on nocturnal ventilation. I last saw her Aug 2023. She has a more complicated trach hx for on-going aphonia for which I referred to her [facility].   Recent ENT Hx -Referred to ENT May 2023 for subglottis stenosis. subglottic mass removed  -direct laryngoscopy and airway evaluation in September 2023.  Large subglottic posteriorly-based granuloma which was eminating superiorly through the glottis, biopsied and send for permanent pathology; thick circumferential subglottic granulations tissue, which was removed with cupped forceps and coblator  Much improved subglottic airway following the procedure F/u 11/8: had been doing better initially post-op.  he visualized subglottis and proximal trachea are patent and the Shiley tracheostomy tube is visible in the subglottis the trachea is edematous and there does not appear to be much room around the tracheostomy device they recommended fenestrated trach which we placed.  This was subsequently discontinued as the patient did not tolerate it. She has surgery scheduled for 1/23 at [facility] w/ plan for Micro laryngectomy, removal of granulation, tracheal dilation and kenalog injection w/ hope that this will allow her to phonate and tolerate PMV again. We have agreed we do not want to work towards decannulation as she has been intol of BIPAP in past. While waiting I see she has had a f/u echo and c/w 2022 her EF has improved from 45-50% to 55-60%, there is mod LVH, RV fxn mildly reduced, she has severe elevation of PAS (88 mmHg).  Presents today for trach change    Review of Systems  Constitutional:  Negative for chills, fever, malaise/fatigue and weight loss.  HENT:  Negative for congestion, sinus pain and sore throat.   Eyes: Negative.   Respiratory: Negative.    Cardiovascular:  Negative for chest pain and leg swelling.  Gastrointestinal:   Positive for heartburn.  Genitourinary: Negative.   Musculoskeletal: Negative.   Endo/Heme/Allergies: Negative.     Exam General sitting up in wc. No distress.  HENT # 6 cuffed trach. Stoma unremarkable. Ability to phonate continues to be intermittent and usually inaudible. She has PMV at home. Some times can tolerate, sometimes can't.  Pulm clear  Card rrr Abd soft Ext warm and dry  Neuro intact   Procedure Pre-medicated w/ inhaled lidocaine. The # 6 trach was removed. Site inspected and looked clean and unremarkable. A new trach was placed over obturator. Pt tolerated well. Placement verified via ETCO2   Principal Problem:   Tracheostomy status (HCC) Active Problems:   Subglottic stenosis   OSA (obstructive sleep apnea)   Chronic respiratory failure with hypoxia and hypercapnia (HCC)   Dependence on home ventilator (HCC)   Obesity hypoventilation syndrome (HCC)   WHO group 3 pulmonary arterial hypertension (HCC)   Gastroparesis    Tracheostomy status (HCC) Overview:  Trach Brand: Shiley Size:  6.0 [id number] Style: Cuffed Secured by: Velcro  Last change 12/18   Discussion Stable tracheostomy.  Not a candidate for decannulation d/t intolerance of NIPPV  Plan Continue routine trach care ROV 4 weeks for planned tracheostomy change Continue nocturnal ventilation    Subglottic stenosis Assessment & Plan: Scheduled 1/23 at [facility] w/ plan for Micro laryngectomy, removal of granulation, tracheal dilation and kenalog injection w/ hope that this will allow her to phonate and tolerate PMV again.  WHO group 3 pulmonary arterial hypertension (HCC)    My time 24 min   Simonne Martinet ACNP-BC  [facility] Pulmonary/Critical Care Pager # [phone] OR # [phone] if no answer

## 2023-06-21 NOTE — Assessment & Plan Note (Signed)
Scheduled 1/23 at [facility] w/ plan for Micro laryngectomy, removal of granulation, tracheal dilation and kenalog injection w/ hope that this will allow her to phonate and tolerate PMV again.

## 2023-06-22 ENCOUNTER — Inpatient Hospital Stay: Payer: PPO | Attending: Hematology

## 2023-06-22 DIAGNOSIS — D500 Iron deficiency anemia secondary to blood loss (chronic): Secondary | ICD-10-CM

## 2023-06-22 DIAGNOSIS — D509 Iron deficiency anemia, unspecified: Secondary | ICD-10-CM | POA: Diagnosis present

## 2023-06-22 DIAGNOSIS — D696 Thrombocytopenia, unspecified: Secondary | ICD-10-CM | POA: Diagnosis not present

## 2023-06-22 DIAGNOSIS — D508 Other iron deficiency anemias: Secondary | ICD-10-CM

## 2023-06-22 LAB — COMPREHENSIVE METABOLIC PANEL
ALT: 10 U/L (ref 0–44)
AST: 13 U/L — ABNORMAL LOW (ref 15–41)
Albumin: 3.6 g/dL (ref 3.5–5.0)
Alkaline Phosphatase: 85 U/L (ref 38–126)
Anion gap: 8 (ref 5–15)
BUN: 27 mg/dL — ABNORMAL HIGH (ref 8–23)
CO2: 17 mmol/L — ABNORMAL LOW (ref 22–32)
Calcium: 9.1 mg/dL (ref 8.9–10.3)
Chloride: 112 mmol/L — ABNORMAL HIGH (ref 98–111)
Creatinine, Ser: 1.1 mg/dL — ABNORMAL HIGH (ref 0.44–1.00)
GFR, Estimated: 53 mL/min — ABNORMAL LOW (ref >60)
Glucose, Bld: 165 mg/dL — ABNORMAL HIGH (ref 70–99)
Potassium: 3.2 mmol/L — ABNORMAL LOW (ref 3.5–5.1)
Sodium: 137 mmol/L (ref 135–145)
Total Bilirubin: 0.6 mg/dL (ref <1.2)
Total Protein: 7.3 g/dL (ref 6.5–8.1)

## 2023-06-22 LAB — CBC WITH DIFFERENTIAL/PLATELET
Abs Immature Granulocytes: 0.02 10*3/uL (ref 0.00–0.07)
Basophils Absolute: 0.1 10*3/uL (ref 0.0–0.1)
Basophils Relative: 1 %
Eosinophils Absolute: 0.2 10*3/uL (ref 0.0–0.5)
Eosinophils Relative: 3 %
HCT: 36.8 % (ref 36.0–46.0)
Hemoglobin: 11.6 g/dL — ABNORMAL LOW (ref 12.0–15.0)
Immature Granulocytes: 0 %
Lymphocytes Relative: 24 %
Lymphs Abs: 1.6 10*3/uL (ref 0.7–4.0)
MCH: 30.9 pg (ref 26.0–34.0)
MCHC: 31.5 g/dL (ref 30.0–36.0)
MCV: 97.9 fL (ref 80.0–100.0)
Monocytes Absolute: 0.5 10*3/uL (ref 0.1–1.0)
Monocytes Relative: 8 %
Neutro Abs: 4.1 10*3/uL (ref 1.7–7.7)
Neutrophils Relative %: 64 %
Platelets: 135 10*3/uL — ABNORMAL LOW (ref 150–400)
RBC: 3.76 MIL/uL — ABNORMAL LOW (ref 3.87–5.11)
RDW: 14.7 % (ref 11.5–15.5)
WBC: 6.4 10*3/uL (ref 4.0–10.5)
nRBC: 0 % (ref 0.0–0.2)

## 2023-06-22 LAB — FERRITIN: Ferritin: 266 ng/mL (ref 11–307)

## 2023-06-22 LAB — IRON AND TIBC
Iron: 57 ug/dL (ref 28–170)
Saturation Ratios: 25 % (ref 10.4–31.8)
TIBC: 231 ug/dL — ABNORMAL LOW (ref 250–450)
UIBC: 174 ug/dL

## 2023-06-22 LAB — VITAMIN B12: Vitamin B-12: 610 pg/mL (ref 180–914)

## 2023-06-22 IMAGING — DX DG ABD PORTABLE 1V
2 series · 2 of 2 positions shown · non-contrast
Comparison: 01/22/21

CLINICAL DATA: Altered mental status with abdominal pain.

EXAM:
PORTABLE ABDOMEN - 1 VIEW

[abdomen kub (1 of 2)]
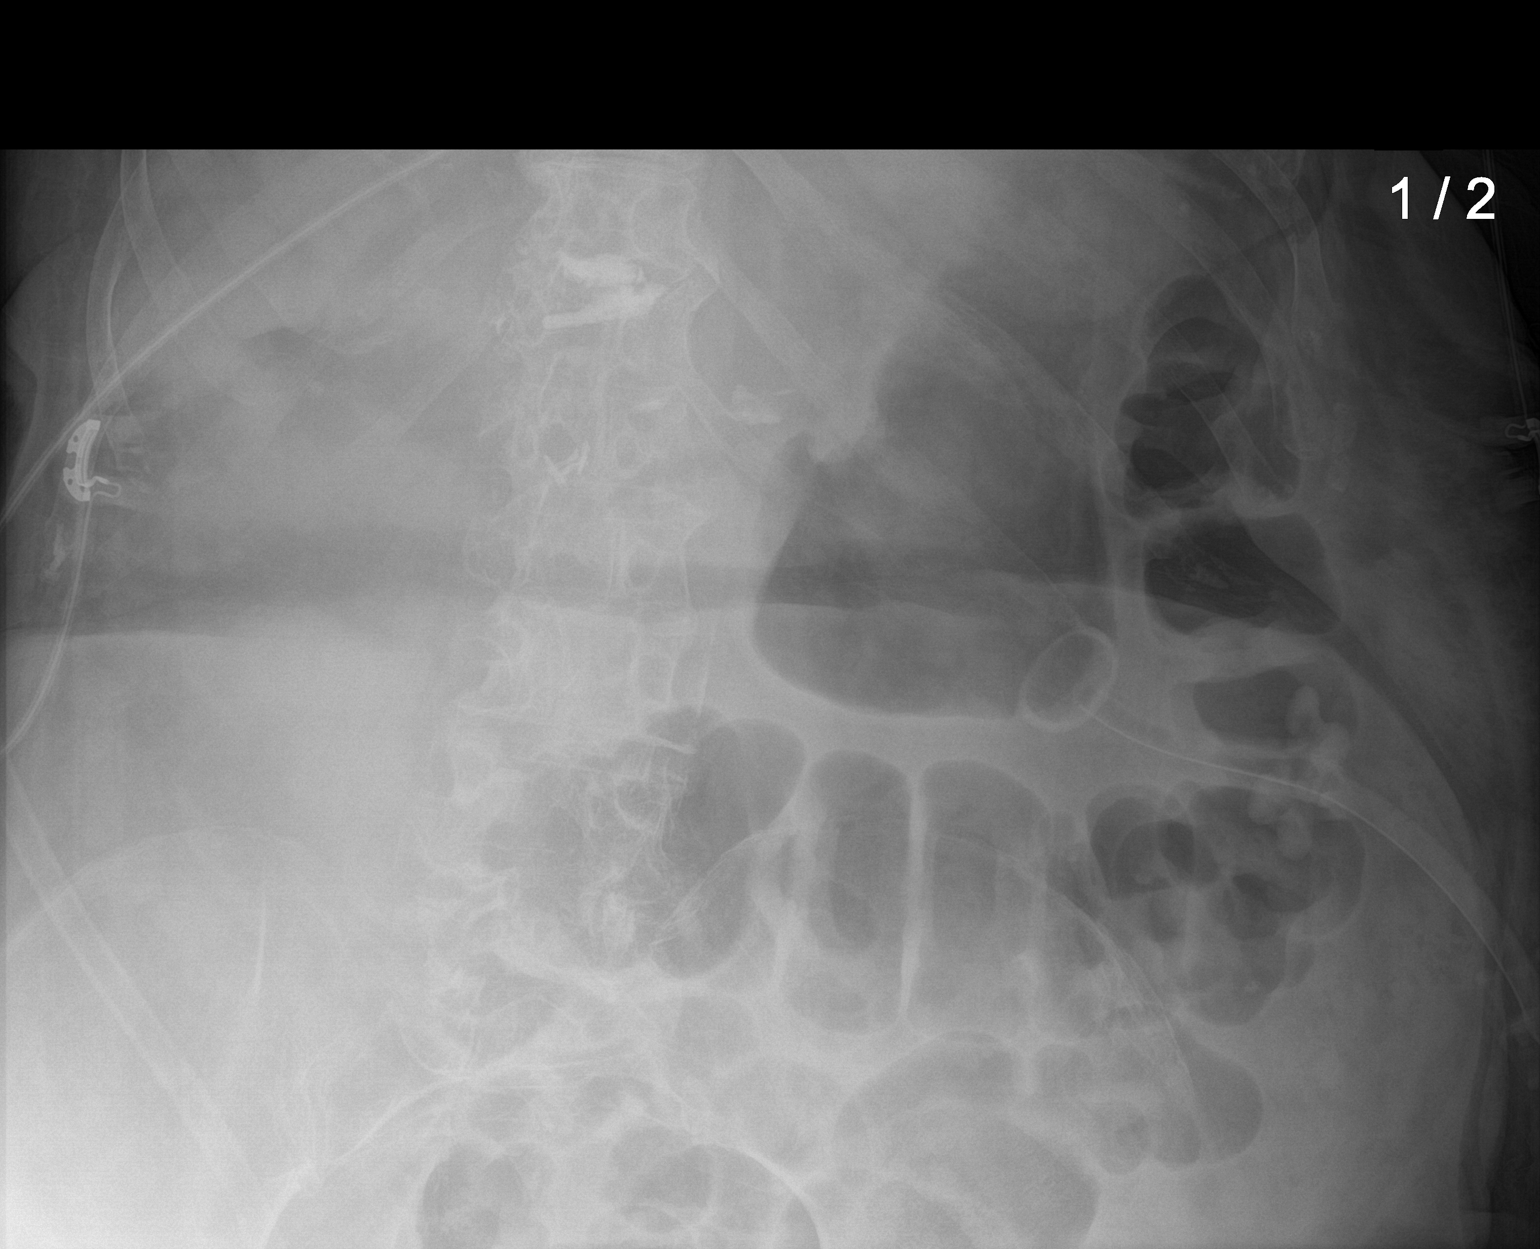

[abdomen kub (2 of 2)]
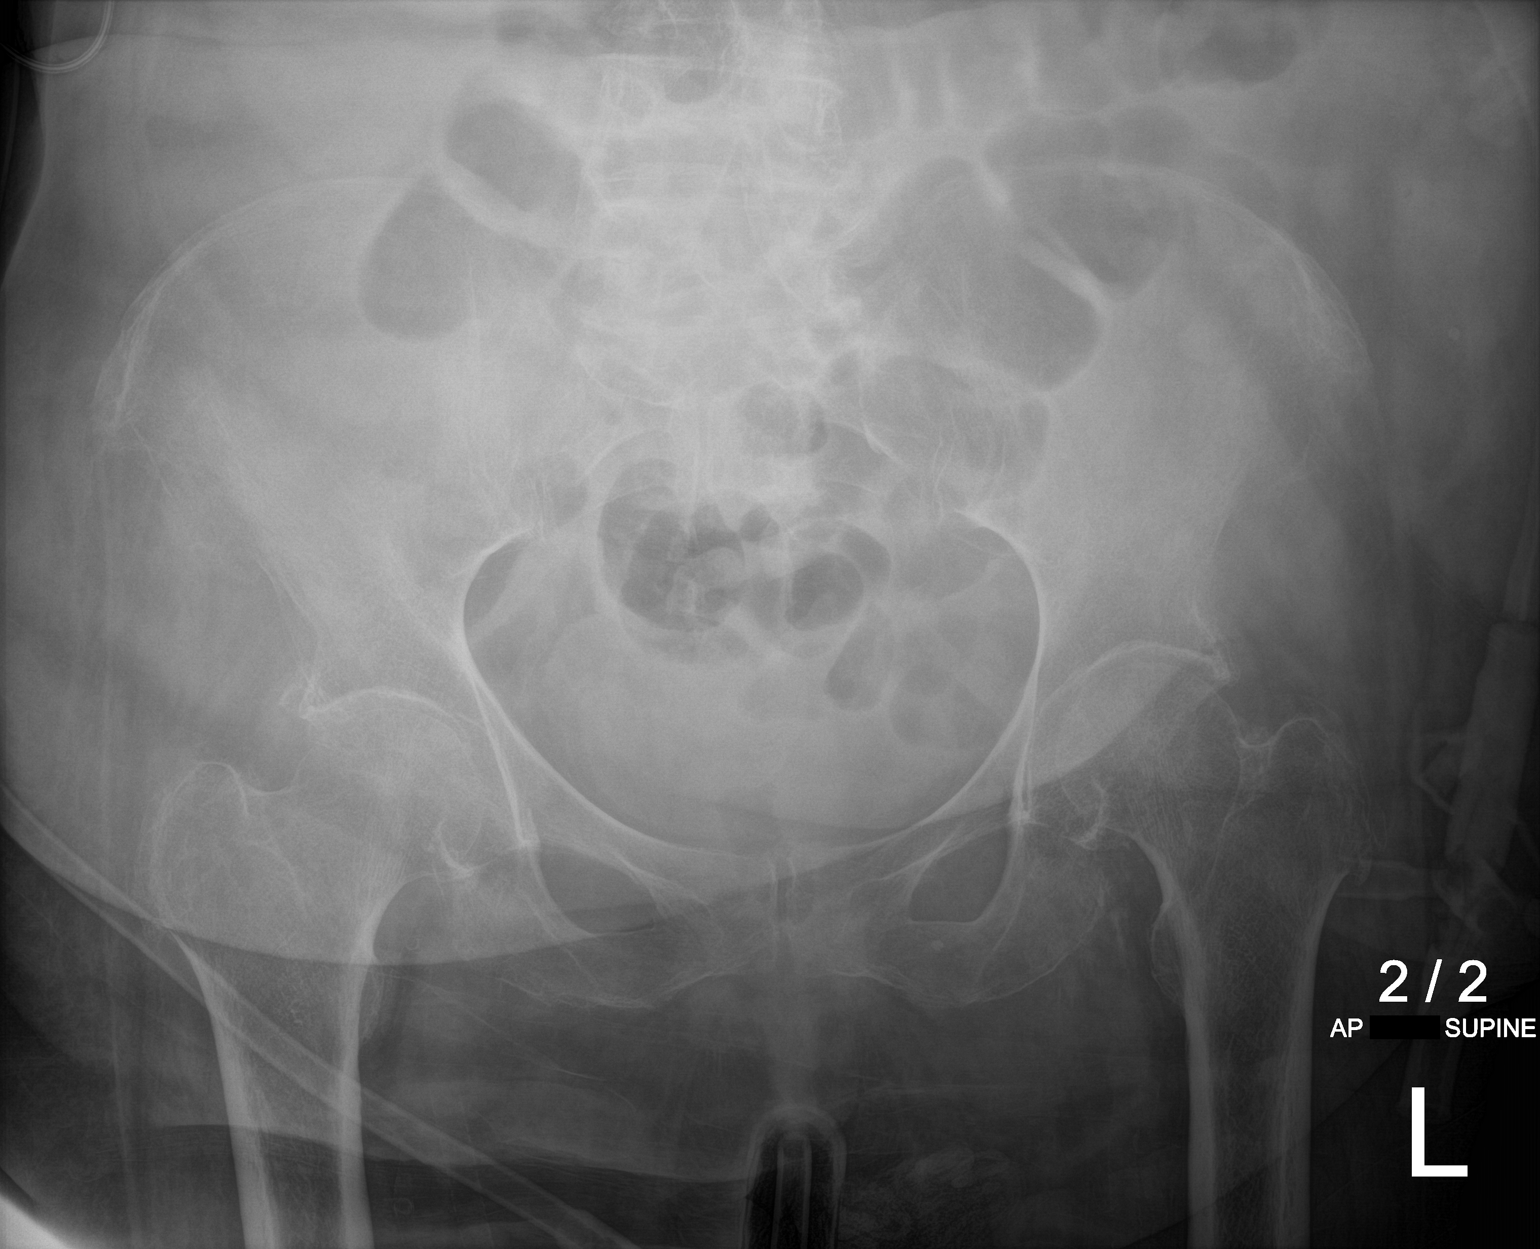

[2 of 2 positions shown; findings below may reference images not displayed]

FINDINGS: Gastrostomy tube is again noted in the left upper quadrant of the
abdomen. Mild diffuse gaseous distension of the colon is noted with
gas up to the level of the rectum. There is been interval decrease
in small bowel gas compared with the previous exam. No signs to
suggest bowel obstruction.
IMPRESSION: 1. Interval decrease in small bowel gas compared with the previous
exam.
2. Persistent mild gaseous distension of the colon.

## 2023-06-23 ENCOUNTER — Ambulatory Visit: Payer: PPO | Admitting: Family Medicine

## 2023-06-26 IMAGING — CT CT CHEST W/O CM
2 of 3 series · 15 of 36 positions shown, 18 images · non-contrast
Comparison: Chest CT 01/03/2021.

CLINICAL DATA: 69-year-old female with history of amiodarone use.
Evaluate for potential amiodarone pulmonary toxicity.

EXAM:
CT CHEST WITHOUT CONTRAST
TECHNIQUE: Multidetector CT imaging of the chest was performed following the
standard protocol without IV contrast.

[Series 3: chest w/o 2mm st · axial · non-contrast · 0.81mm/px · z∈[+436,+686]mm · 12 of 147 slices shown, 15 images]
[im 11/147  mediastinal]
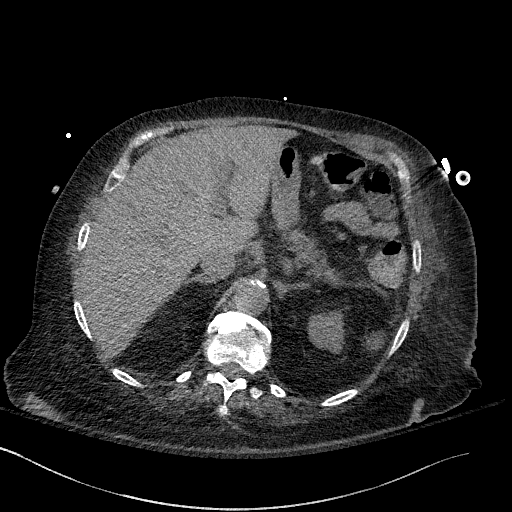
[im 11/147  lung]
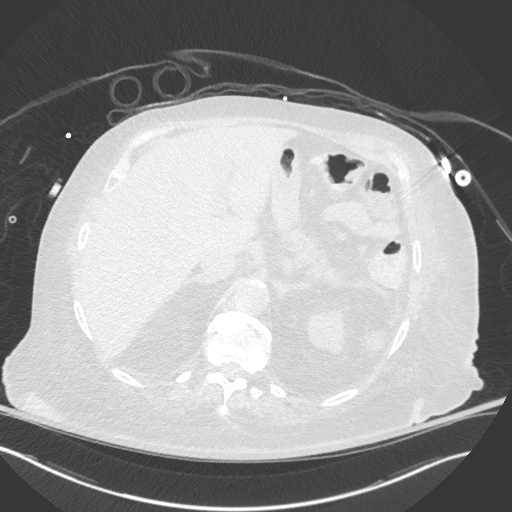
[im 22/147  lung]
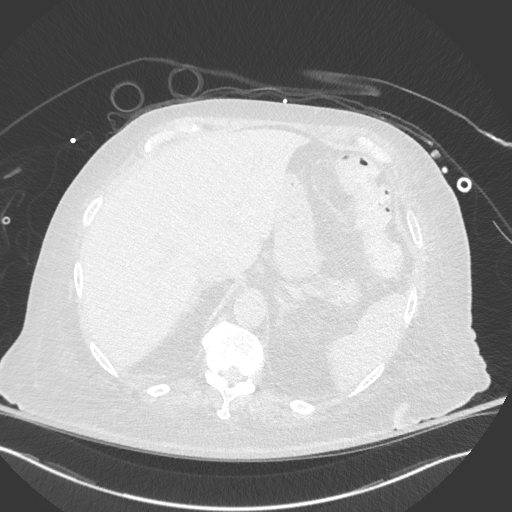
[im 33/147  lung]
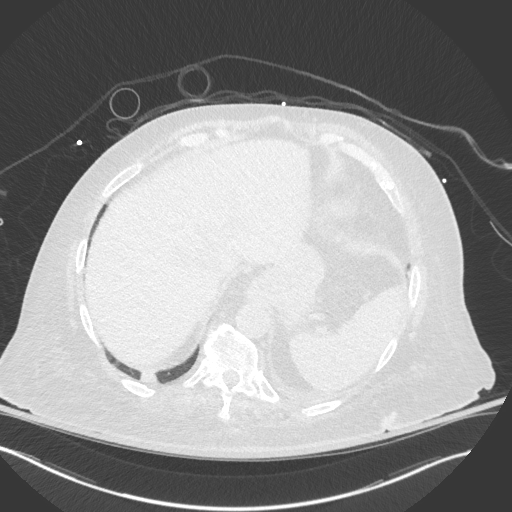
[im 44/147  lung]
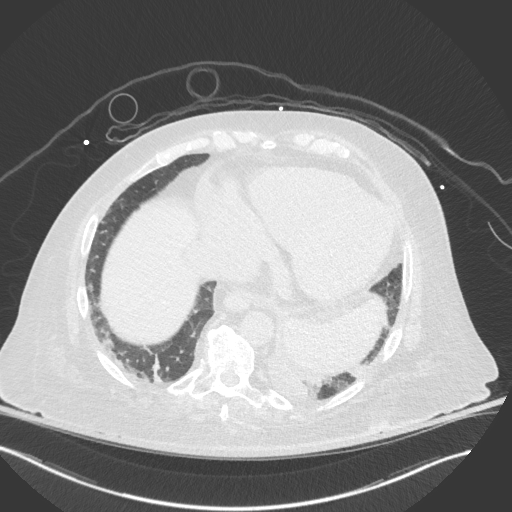
[im 55/147  mediastinal]
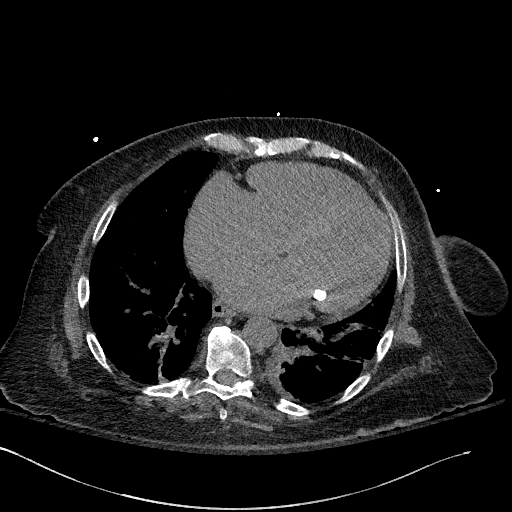
[im 55/147  lung]
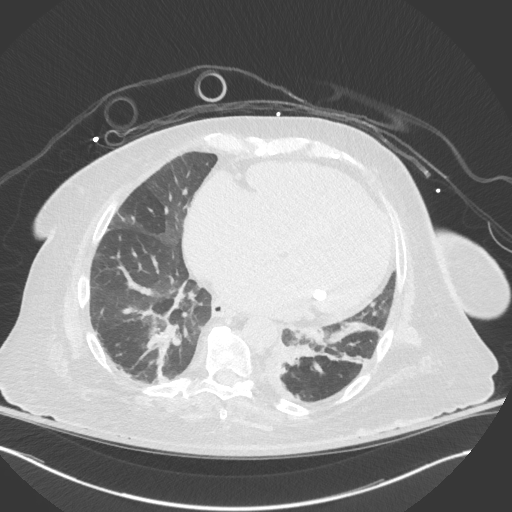
[im 65/147  lung]
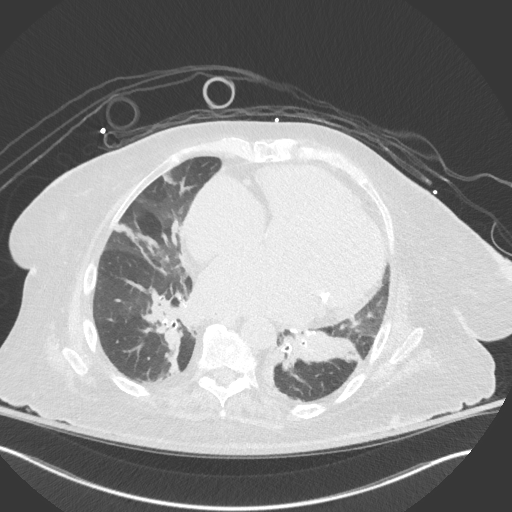
[im 82/147  lung]
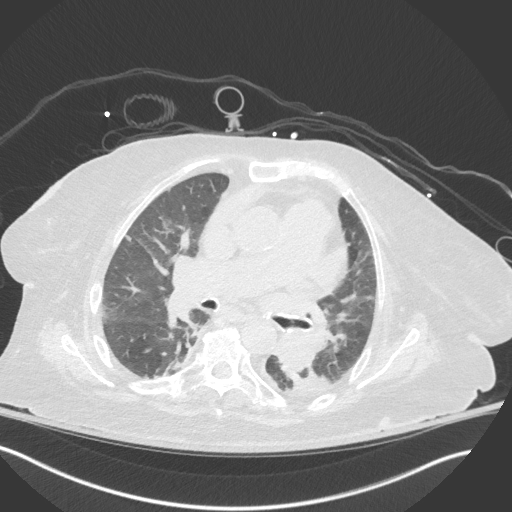
[im 92/147  lung]
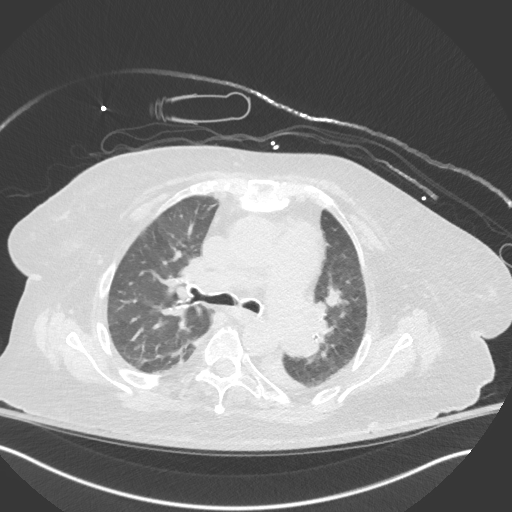
[im 103/147  mediastinal]
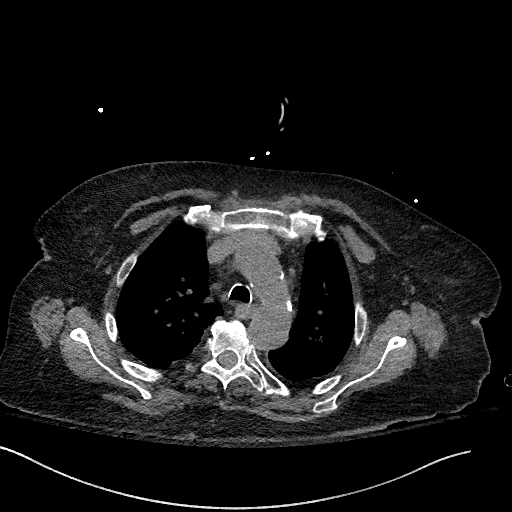
[im 103/147  lung]
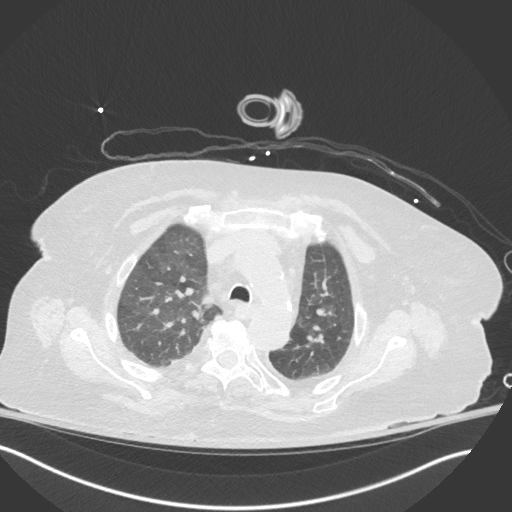
[im 114/147  lung]
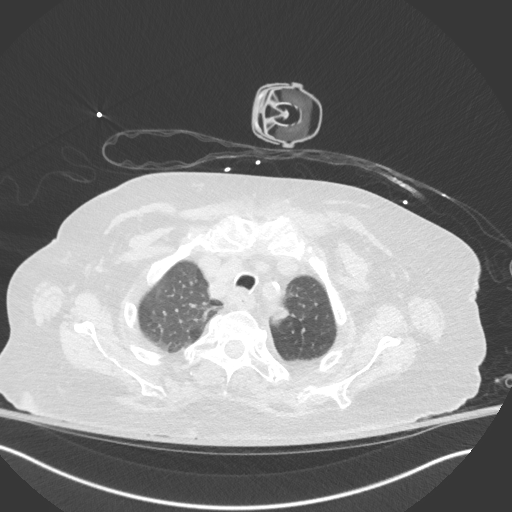
[im 125/147  lung]
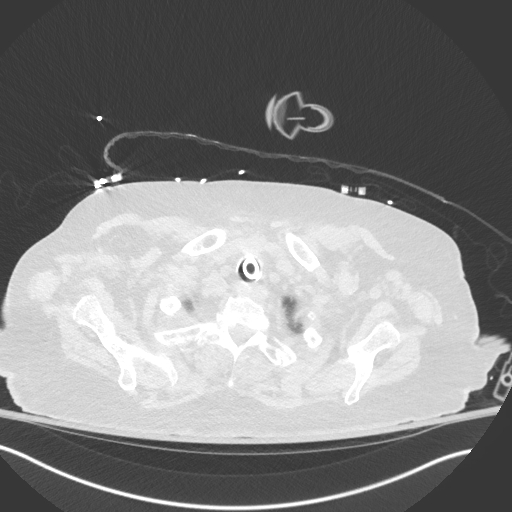
[im 136/147  lung]
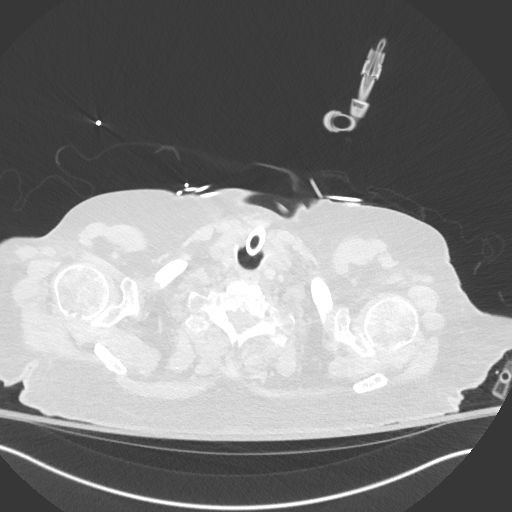

[Series 5: chest w/o 2mm st cor · coronal · non-contrast · 0.61mm/px · 3 of 119 slices shown]
[im 24/119  lung]
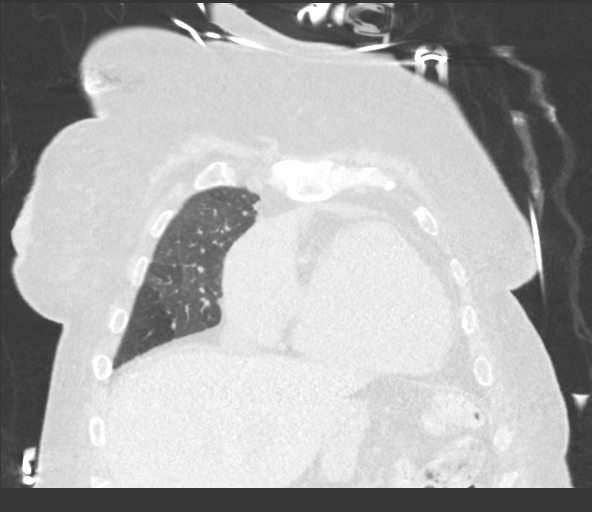
[im 48/119  lung]
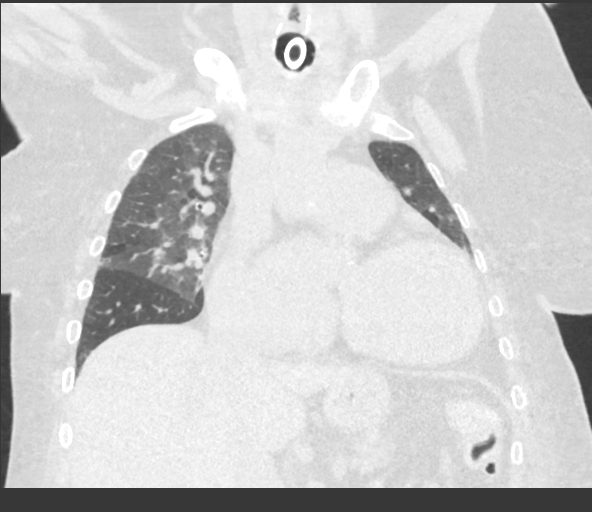
[im 71/119  lung]
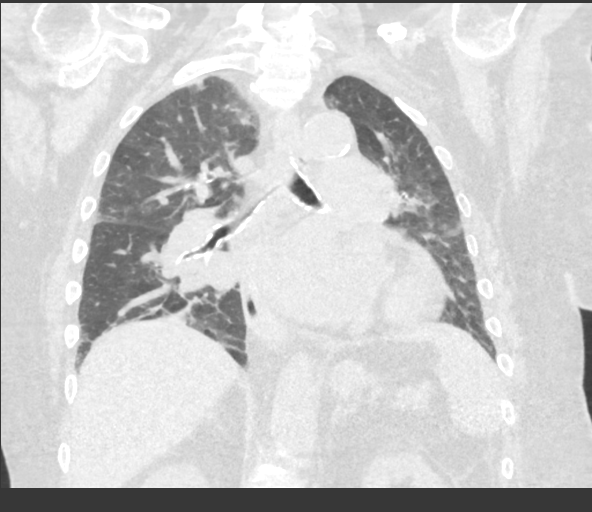

[15 of 36 positions shown; findings below may reference images not displayed]

FINDINGS: Cardiovascular: Heart size is enlarged. There is no significant
pericardial fluid, thickening or pericardial calcification. There is
aortic atherosclerosis, as well as atherosclerosis of the great
vessels of the mediastinum and the coronary arteries, including
calcified atherosclerotic plaque in the left anterior descending
coronary artery. Severe calcifications of the mitral annulus.
Dilatation of the pulmonic trunk (4.0 cm in diameter).

Mediastinum/Nodes: No pathologically enlarged mediastinal or hilar
lymph nodes. Please note that accurate exclusion of hilar adenopathy
is limited on noncontrast CT scans. Esophagus is unremarkable in
appearance. No axillary lymphadenopathy. Tracheostomy tube in
position with tip terminating 6.3 cm above the level of the carina.

Lungs/Pleura: Patchy areas of ground-glass attenuation are noted
throughout the lungs bilaterally. In addition, there are areas of
profound thickening of the peribronchovascular interstitium with
regional areas of architectural distortion and volume loss, similar
to the prior study 01/03/2021. None of these areas of architectural
distortion demonstrate definitive internal high attenuation. Trace
left pleural effusion. No right pleural effusion.

Upper Abdomen: Aortic atherosclerosis.

Musculoskeletal: Old healed fracture of the upper sternum. Chronic
appearing compression fractures at T3 and T12 with post
vertebroplasty changes at T12 where there is 40% loss of anterior
vertebral body height. There are no aggressive appearing lytic or
blastic lesions noted in the visualized portions of the skeleton.
IMPRESSION: 1. There are fibrotic changes in the lungs, as above, suggestive of
probable amiodarone induced pulmonary toxicity. Alternatively, some
of these findings could be seen in the setting of chronic post
infectious or inflammatory scarring or recurrent chronic aspiration.
2. Dilatation of the pulmonic trunk (4.0 cm in diameter), concerning
for associated pulmonary arterial hypertension.
3. Cardiomegaly.
4. Aortic atherosclerosis, in addition to left anterior descending
coronary artery disease. Assessment for potential risk factor
modification, dietary therapy or pharmacologic therapy may be
warranted, if clinically indicated.

Aortic Atherosclerosis ([id number]).

## 2023-06-28 ENCOUNTER — Other Ambulatory Visit: Payer: Self-pay | Admitting: Family Medicine

## 2023-06-29 ENCOUNTER — Inpatient Hospital Stay: Payer: PPO | Admitting: Oncology

## 2023-06-29 ENCOUNTER — Encounter: Payer: Self-pay | Admitting: Oncology

## 2023-06-29 DIAGNOSIS — D500 Iron deficiency anemia secondary to blood loss (chronic): Secondary | ICD-10-CM | POA: Diagnosis not present

## 2023-06-29 DIAGNOSIS — D696 Thrombocytopenia, unspecified: Secondary | ICD-10-CM

## 2023-06-29 NOTE — Progress Notes (Signed)
[facility] [address], [place] [address]   Clinic Day:  06/29/2023  Referring physician: Wylene Men*  Patient Care Team: Del Newman Nip, Tenna Child, FNP as PCP - General (Family Medicine) Wyline Mood, Dorothe Pea, MD as PCP - Cardiology (Cardiology) Iran Ouch, MD as PCP - [facility] Cardiology (Cardiology) West Bali, MD (Inactive) as Consulting Physician (Gastroenterology) Galen Manila, Novella Olive, MD (Inactive) as Consulting Physician (Hematology and Oncology) Lanelle Bal, DO as Consulting Physician (Internal Medicine)  I connected with [name] on 06/29/23 at  3:00 PM EST by telephone visit and verified that I am speaking with the correct person using two identifiers.   I discussed the limitations, risks, security and privacy concerns of performing an evaluation and management service by telemedicine and the availability of in-person appointments. I also discussed with the patient that there may be a patient responsible charge related to this service. The patient expressed understanding and agreed to proceed.   Other persons participating in the visit and their role in the encounter: NP, Patient    Patient's location: Home   Provider's location: Clinic    CHIEF COMPLAINT/PURPOSE OF CONSULT:   Diagnosis: Iron deficiency anemia  HISTORY OF PRESENT ILLNESS:   Harlequin is a 72 y.o. female presenting to clinic today for evaluation of iron deficiency anemia. She was last seen in clinic on 03/23/2023.  She received 1 g of Monoferric on 03/30/2023.  She was hospitalized from 04/29/2023-05/03/2023 for possible aspiration pneumonia and potentially colitis.  She was admitted and transferred to ICU under sepsis protocol.  She was given IV antibiotics.  Patient has past medical history significant for chronic respiratory failure and supraglottic stenosis.  She has her trach changed monthly and continues to use her ventilator at home as needed.  She was  instructed to stop B12 supplements for a rising B12 level.  Reports overall feeling well today.  She presents with her daughter.  Appetite and energy levels are 50%.  Continues to have intermittent right knee pain.  She denies any bright red blood per rectum, melena or hematochezia.  Reports some increased fatigue and sleeping longer over the past month or so.   PAST MEDICAL HISTORY:   Past Medical History: Past Medical History:  Diagnosis Date   Abnormal pulmonary function test    Anemia    H/H of 10/30 with a normal MCV in 12/09   Anxiety    Arthritis    Barrett's esophagus    Diagnosed 1995. Last EGD 2016-NO BARRETT'S.    Chest pain    a. 2022 Cath: nl cors; b. 2008 Neg myoview; c. 01/2014 Cath: Nl cors. EF 40%; c. 08/2020 NSTEMI/Cath [facility]): nl cors.   Chronic anticoagulation    Chronic combined systolic and diastolic CHF (congestive heart failure) (HCC)    a. 10/2014 Echo: EF40%; b. 01/2015 Echo: EF 55-60%; c. 11/2020 Echo: EF 55-60%, mod LVH, sev BAE, mild MR, mod TR. PASP ; d. 01/2021 Echo: EF 45-50%, glob HK, mod conc LVH, mod reduced RV fxn, RVSP 60.35mmHg, Mod TR, mild to mod MR, sev dil LA.   Chronic LBP    Surgical intervention in 1996   Diabetes mellitus, type 2 (HCC)    Insulin therapy; exacerbated by prednisone   Gastroparesis    99% retention 05/2008 on GES   GERD (gastroesophageal reflux disease)    Heart attack (HCC) 08/13/2020   Hiatal hernia    Hyperlipidemia    Hypertension  Hypothyroid    IBS (irritable bowel syndrome)    Obesity    OSA on CPAP    had CPAP and cannot tolerate.   Paroxysmal atrial fibrillation (HCC)    a.CHA2DS2VASc = 6-->eliquis. Also on Amio.   Pulmonary hypertension (HCC) 01/2015   a. 01/2015 RHC: Predominantly pulmonary venous hypertension but may be component of PAH (PA mean 52, PCWP 31); b. 01/2021 Echo: RVSP 60.77mmHg w/ mod reduced RV fxn.   Seizures (HCC)    last seizure was 2 years ago; on keppra for this; unknown etiology    Syncope    a. Admitted 05/2009; magnetic resonance imagin/ MRA - negative; etiology thought to be orthostasis secondary to drugs and dehydration. b. Syncope 02/2015 also felt 2/2 dehydration.    Surgical History: Past Surgical History:  Procedure Laterality Date   BACK SURGERY  1996   BIOPSY N/A 11/08/2013   Procedure: BIOPSY  / Tissue sampling / ulcers present in small intestine;  Surgeon: West Bali, MD;  Location: [facility];  Service: Endoscopy;  Laterality: N/A;   CARDIAC CATHETERIZATION  2002   CARDIAC CATHETERIZATION N/A 01/26/2015   Procedure: Right Heart Cath;  Surgeon: Laurey Morale, MD;  Location: [facility] INVASIVE CV LAB;  Service: Cardiovascular;  Laterality: N/A;   CARDIOVASCULAR STRESS TEST  2008   Stress nuclear study   CARDIOVERSION N/A 03/06/2015   Procedure: CARDIOVERSION;  Surgeon: Laurey Morale, MD;  Location: [facility] ENDOSCOPY;  Service: Cardiovascular;  Laterality: N/A;   CARPAL TUNNEL RELEASE  1994   CATARACT EXTRACTION W/PHACO Right 01/10/2022   Procedure: CATARACT EXTRACTION PHACO AND INTRAOCULAR LENS PLACEMENT (IOC);  Surgeon: [name], MD;  Location: [facility] ORS;  Service: Ophthalmology;  Laterality: Right;  CDE: 21.51   CATARACT EXTRACTION W/PHACO Left 01/24/2022   Procedure: CATARACT EXTRACTION PHACO AND INTRAOCULAR LENS PLACEMENT (IOC);  Surgeon: [name], MD;  Location: [facility] ORS;  Service: Ophthalmology;  Laterality: Left;  CDE: 17.13   COLONOSCOPY  11/2011   Dr. Darrick Penna: Internal hemorrhoids, mild diverticulosis. Random colon biopsies negative.   COLONOSCOPY N/A 11/08/2013   SLF: Normal mucosa in the terminal ileum/The colon IS redundant/  Moderate diverticulosis throughout the entire colon. ileum bx benign. colon bx benign   CRYOTHERAPY  01/23/2021   Procedure: CRYOTHERAPY;  Surgeon: Lorin Glass, MD;  Location: [facility] ENDOSCOPY;  Service: Pulmonary;;   CRYOTHERAPY  01/24/2021   Procedure: Cloyde Reams;  Surgeon: Lorin Glass, MD;  Location: [facility]  ENDOSCOPY;  Service: Pulmonary;;   CRYOTHERAPY  01/26/2021   Procedure: Cloyde Reams;  Surgeon: Lupita Leash, MD;  Location: [facility] ENDOSCOPY;  Service: Cardiopulmonary;;   ESOPHAGOGASTRODUODENOSCOPY  2008   Barrett's without dysplasia. esphagus dilated. antral erosions, h.pylori serologies negative.   ESOPHAGOGASTRODUODENOSCOPY  11/2011   Dr. Darrick Penna: Barrett's esophagus, mild gastritis, diverticulum in the second portion of the duodenum repeat EGD 3 years. Small bowel biopsies negative. Gastric biopsy show reactive gastropathy but no H. pylori. Esophageal biopsies consistent with GERD. Next EGD 11/2014   ESOPHAGOGASTRODUODENOSCOPY N/A 11/21/2014   VWU:JWJX non-erosive gastritis/irregular z-line   ESOPHAGOGASTRODUODENOSCOPY  03/2022   FOREIGN BODY REMOVAL  01/23/2021   Procedure: FOREIGN BODY REMOVAL;  Surgeon: Lorin Glass, MD;  Location: [facility] ENDOSCOPY;  Service: Pulmonary;;   FOREIGN BODY REMOVAL  01/24/2021   Procedure: FOREIGN BODY REMOVAL;  Surgeon: Lorin Glass, MD;  Location: [facility] ENDOSCOPY;  Service: Pulmonary;;   GIVENS CAPSULE STUDY  12/07/2011   Proximal small bowel, rare AVM. Distal small bowel, multiple ulcers noted  GIVENS CAPSULE STUDY N/A 09/27/2013   Distal small bowel ulcers extending to TI.   GIVENS CAPSULE STUDY N/A 10/10/2013   Procedure: GIVENS CAPSULE STUDY;  Surgeon: West Bali, MD;  Location: [facility];  Service: Endoscopy;  Laterality: N/A;  7:30   HEMOSTASIS CONTROL  01/24/2021   Procedure: HEMOSTASIS CONTROL;  Surgeon: Lorin Glass, MD;  Location: [facility] ENDOSCOPY;  Service: Pulmonary;;   HEMOSTASIS CONTROL  01/26/2021   Procedure: HEMOSTASIS CONTROL;  Surgeon: Lupita Leash, MD;  Location: [facility] ENDOSCOPY;  Service: Cardiopulmonary;;   IR GASTROSTOMY TUBE MOD SED  01/11/2021   IR GASTROSTOMY TUBE REMOVAL  04/13/2021   IR KYPHO THORACIC WITH BONE BIOPSY  02/09/2018   KNEE ARTHROSCOPY WITH MEDIAL MENISECTOMY Right 06/09/2016   Procedure: KNEE  ARTHROSCOPY WITH MEDIAL MENISECTOMY;  Surgeon: Vickki Hearing, MD;  Location: [facility] ORS;  Service: Orthopedics;  Laterality: Right;  medial and lateral menisectomy   LAMINECTOMY  1995   L4-L5   LAPAROSCOPIC CHOLECYSTECTOMY  1990s   LEFT HEART CATHETERIZATION WITH CORONARY ANGIOGRAM  01/10/2014   Procedure: LEFT HEART CATHETERIZATION WITH CORONARY ANGIOGRAM;  Surgeon: Lesleigh Noe, MD;  Location[facility] CATH LAB;  Service: Cardiovascular;;   MICROLARYNGOSCOPY WITH LASER AND BALLOON DILATION N/A 11/17/2021   Procedure: MICROLARYNGOSCOPY WITH CO2 LASER, SUBGLOTTIC LARYNGEAL MASS BIOSPY;  Surgeon: Serena Colonel, MD;  Location: [facility] OR;  Service: ENT;  Laterality: N/A;   RIGHT HEART CATHETERIZATION N/A 01/10/2014   Procedure: RIGHT HEART CATH;  Surgeon: Lesleigh Noe, MD;  Location: [facility] CATH LAB;  Service: Cardiovascular;  Laterality: N/A;   TOTAL ABDOMINAL HYSTERECTOMY  1999   TRACHEOSTOMY REVISION N/A 07/18/2021   Procedure: TRACHEOSTOMY REVISION;  Surgeon: Suzanna Obey, MD;  Location: [facility] OR;  Service: ENT;  Laterality: N/A;   TRACHEOSTOMY TUBE PLACEMENT N/A 09/01/2017   Procedure: TRACHEOSTOMY;  Surgeon: Drema Halon, MD;  Location: [facility] OR;  Service: ENT;  Laterality: N/A;   VIDEO BRONCHOSCOPY Bilateral 01/23/2021   Procedure: VIDEO BRONCHOSCOPY WITHOUT FLUORO;  Surgeon: Lorin Glass, MD;  Location: [facility] ENDOSCOPY;  Service: Pulmonary;  Laterality: Bilateral;   VIDEO BRONCHOSCOPY Bilateral 01/24/2021   Procedure: VIDEO BRONCHOSCOPY WITHOUT FLUORO;  Surgeon: Lorin Glass, MD;  Location: [facility] ENDOSCOPY;  Service: Pulmonary;  Laterality: Bilateral;   VIDEO BRONCHOSCOPY N/A 01/26/2021   Procedure: VIDEO BRONCHOSCOPY WITHOUT FLUORO;  Surgeon: Lupita Leash, MD;  Location: [facility] ENDOSCOPY;  Service: Cardiopulmonary;  Laterality: N/A;    Social History: Social History   Socioeconomic History   Marital status: Married    Spouse name: Not on file   Number of children: Not on file    Years of education: Not on file   Highest education level: Not on file  Occupational History   Occupation: Passenger transport manager at [employer]    Employer: [employer]    Comment: disabled    Employer: RETIRED  Tobacco Use   Smoking status: Former    Current packs/day: 0.00    Average packs/day: 0.3 packs/day for 27.3 years (6.8 ttl pk-yrs)    Types: Cigarettes    Start date: 02/26/1966    Quit date: 06/30/1993    Years since quitting: 30.0   Smokeless tobacco: Never   Tobacco comments:    quit in 1984  Vaping Use   Vaping status: Never Used  Substance and Sexual Activity   Alcohol use: Not Currently    Comment: Occasional alcohol 30 years ago (09/02/21)   Drug use: No   Sexual  activity: Yes    Birth control/protection: None, Surgical  Other Topics Concern   Not on file  Social History Narrative   Sedentary   4 children, "blended family"   Social Drivers of Corporate investment banker Strain: Not on file  Food Insecurity: No Food Insecurity (04/30/2023)   Hunger Vital Sign    Worried About Running Out of Food in the Last Year: Never true    Ran Out of Food in the Last Year: Never true  Transportation Needs: No Transportation Needs (04/30/2023)   PRAPARE - Administrator, Civil Service (Medical): No    Lack of Transportation (Non-Medical): No  Physical Activity: Not on file  Stress: Not on file  Social Connections: Not on file  Intimate Partner Violence: Not At Risk (04/30/2023)   Humiliation, Afraid, Rape, and Kick questionnaire    Fear of Current or Ex-Partner: No    Emotionally Abused: No    Physically Abused: No    Sexually Abused: No    Family History: Family History  Problem Relation Age of Onset   Hypertension Mother    Alzheimer's disease Mother    Stroke Mother    Heart attack Mother    Breast cancer Sister    Hypertension Other    Heart disease Neg Hx    Colon cancer Neg Hx    Liver disease Neg Hx     Current Medications:  Current Outpatient  Medications:    acetaminophen (TYLENOL) 650 MG CR tablet, Take 1,300 mg by mouth 2 (two) times daily., Disp: , Rfl:    atorvastatin (LIPITOR) 40 MG tablet, Take 1 tablet (40 mg total) by mouth daily., Disp: 90 tablet, Rfl: 3   busPIRone (BUSPAR) 30 MG tablet, Take 1 tablet (30 mg total) by mouth in the morning and at bedtime., Disp: 60 tablet, Rfl: 3   cefdinir (OMNICEF) 300 MG capsule, Take 1 capsule (300 mg total) by mouth every 12 (twelve) hours., Disp: 14 capsule, Rfl: 0   Cholecalciferol (VITAMIN D-3 PO), Take 1 tablet by mouth daily in the afternoon., Disp: , Rfl:    Continuous Glucose Sensor (FREESTYLE LIBRE 2 SENSOR) MISC, FOR CONTINUOUS GLUCOSE MONITORING., Disp: 2 each, Rfl: 11   Continuous Glucose Sensor (FREESTYLE LIBRE 2 SENSOR) MISC, Use daily, Disp: 1 each, Rfl: 3   Continuous Glucose Sensor (FREESTYLE LIBRE 2 SENSOR) MISC, APPLY ONE SENSOR TO THE SKIN EVERY 14 DAYS AS DIRECTED TO CONTINUOUSLY MONITOR GLUCOSE LEVELS, Disp: 2 each, Rfl: 2   dicyclomine (BENTYL) 10 MG capsule, TAKE 1 CAPSULE BY MOUTH UP TO FOUR TIMES DAILY BEFORE MEALS AND AT BEDTIME (STOP IF CONSTIPATION) (Patient taking differently: Take 10 mg by mouth 4 (four) times daily -  before meals and at bedtime.), Disp: 120 capsule, Rfl: 3   donepezil (ARICEPT) 5 MG tablet, TAKE 1 TABLET BY MOUTH AT BEDTIME FOR dementia, Disp: 90 tablet, Rfl: 3   ELIQUIS 5 MG TABS tablet, TAKE 1 TABLET BY MOUTH TWICE DAILY AS A BLOOD THINNER, Disp: 180 tablet, Rfl: 3   furosemide (LASIX) 20 MG tablet, Take 1 tablet (20 mg total) by mouth daily., Disp: 100 tablet, Rfl: 3   gabapentin (NEURONTIN) 100 MG capsule, TAKE ONE CAPSULE BY MOUTH THREE TIMES DAILY FOR NEUROPATHIC PAIN, Disp: 270 capsule, Rfl: 3   glycopyrrolate (ROBINUL) 1 MG tablet, Take 1 tablet (1 mg total) by mouth 2 (two) times daily. (Patient not taking: Reported on 05/30/2023), Disp: 14 tablet, Rfl: 0   insulin aspart (  NOVOLOG FLEXPEN) 100 UNIT/ML FlexPen, Inject 0-8 Units into the  skin 3 (three) times daily with meals., Disp: 15 mL, Rfl: 4   insulin glargine (LANTUS SOLOSTAR) 100 UNIT/ML Solostar Pen, Inject 20 Units into the skin at bedtime., Disp: 15 mL, Rfl: 4   ipratropium-albuterol (DUONEB) 0.5-2.5 (3) MG/3ML SOLN, Take 3 mLs by nebulization every 4 (four) hours as needed (Asthma)., Disp: , Rfl:    JARDIANCE 10 MG TABS tablet, TAKE 1 TABLET BY MOUTH DAILY., Disp: 30 tablet, Rfl: 0   levETIRAcetam (KEPPRA) 500 MG tablet, TAKE 1 TABLET BY MOUTH TWICE DAILY FOR SEIZURE PREVENTION, Disp: 180 tablet, Rfl: 3   levothyroxine (SYNTHROID) 150 MCG tablet, Take 1 tablet (150 mcg total) by mouth daily before breakfast., Disp: 30 tablet, Rfl: 1   lidocaine (XYLOCAINE) 2 % jelly, Apply 1 Application topically as needed for up to 1 day (around trach stoma and use for trach lube)., Disp: 30 mL, Rfl: 0   loperamide (IMODIUM) 2 MG capsule, Take 2 mg by mouth every 8 (eight) hours as needed for diarrhea or loose stools., Disp: , Rfl:    LORazepam (ATIVAN) 1 MG tablet, Take 1 tablet (1 mg total) by mouth at bedtime. TAKE 1/2 TO 1 TABLET BY MOUTH AT BEDTIME FOR SLEEP Strength: 1 mg, Disp: 30 tablet, Rfl: 0   metoprolol tartrate (LOPRESSOR) 25 MG tablet, TAKE 1 TABLET BY MOUTH TWICE DAILY FOR HIGH BLOOD PRESSURE OR HEART RATE CONTROL, Disp: 180 tablet, Rfl: 3   mupirocin ointment (BACTROBAN) 2 %, Apply 1 Application topically 2 (two) times daily., Disp: 30 g, Rfl: 1   nitroGLYCERIN (NITROSTAT) 0.4 MG SL tablet, Place 0.4 mg under the tongue every 5 (five) minutes as needed for chest pain., Disp: , Rfl:    pantoprazole (PROTONIX) 40 MG tablet, TAKE 1 TABLET BY MOUTH TWICE DAILY FOR acid reflux, Disp: 180 tablet, Rfl: 3   potassium chloride (KLOR-CON M) 10 MEQ tablet, TAKE 2 TABLETS BY MOUTH DAILY TO REPLACE POTASSIUM., Disp: 60 tablet, Rfl: 2   sertraline (ZOLOFT) 50 MG tablet, Take 1 tablet (50 mg total) by mouth daily., Disp: 30 tablet, Rfl: 3   traMADol (ULTRAM) 50 MG tablet, Take 1 tablet  (50 mg total) by mouth 3 (three) times daily as needed for moderate pain or severe pain. (Patient taking differently: Take 50 mg by mouth 3 (three) times daily.), Disp: 90 tablet, Rfl: 0   trolamine salicylate (ASPERCREME) 10 % cream, Apply 1 application. topically as needed (knee pain)., Disp: , Rfl:    Allergies: Allergies  Allergen Reactions   Codeine Nausea And Vomiting and Other (See Comments)    Hallucinations   Dilaudid [Hydromorphone Hcl] Other (See Comments)    Syncope    Reglan [Metoclopramide] Other (See Comments)    Dyskinesia    Levsin [Hyoscyamine] Diarrhea    Caused worse diarrhea    REVIEW OF SYSTEMS:   Review of Systems  Constitutional:  Positive for fatigue.  Respiratory:  Positive for cough and shortness of breath.   Cardiovascular:  Positive for leg swelling.  Musculoskeletal:  Positive for arthralgias (knee pain).  Neurological:  Positive for numbness.  Psychiatric/Behavioral:  Positive for sleep disturbance.      VITALS:   There were no vitals taken for this visit.  Wt Readings from Last 3 Encounters:  05/30/23 169 lb (76.7 kg)  05/19/23 173 lb 9.6 oz (78.7 kg)  05/15/23 170 lb (77.1 kg)    There is no height or weight on file  to calculate BMI.   PHYSICAL EXAM:   Physical Exam Neurological:     Mental Status: She is alert and oriented to person, place, and time.     LABS:      Latest Ref Rng & Units 06/22/2023    1:31 PM 05/03/2023    5:35 AM 05/02/2023    5:18 AM  CBC  WBC 4.0 - 10.5 K/uL 6.4  8.8  10.7   Hemoglobin 12.0 - 15.0 g/dL 57.3  22.0  25.4   Hematocrit 36.0 - 46.0 % 36.8  37.0  39.7   Platelets 150 - 400 K/uL 135  142  137       Latest Ref Rng & Units 06/22/2023    1:31 PM 05/03/2023    5:35 AM 05/02/2023    5:18 AM  CMP  Glucose 70 - 99 mg/dL 270  623  762   BUN 8 - 23 mg/dL 27  19  22    Creatinine 0.44 - 1.00 mg/dL 8.31  5.17  6.16   Sodium 135 - 145 mmol/L 137  141  142   Potassium 3.5 - 5.1 mmol/L 3.2  3.6   3.6   Chloride 98 - 111 mmol/L 112  110  111   CO2 22 - 32 mmol/L 17  22  22    Calcium 8.9 - 10.3 mg/dL 9.1  9.0  9.2   Total Protein 6.5 - 8.1 g/dL 7.3     Total Bilirubin <1.2 mg/dL 0.6     Alkaline Phos 38 - 126 U/L 85     AST 15 - 41 U/L 13     ALT 0 - 44 U/L 10        No results found for: "CEA1", "CEA" / No results found for: "CEA1", "CEA" No results found for: "PSA1" No results found for: "CAN199" No results found for: "CAN125"  Lab Results  Component Value Date   TOTALPROTELP 7.0 10/11/2022   TOTALPROTELP 6.7 10/11/2022   ALBUMINELP 3.8 10/11/2022   A1GS 0.2 10/11/2022   A2GS 0.7 10/11/2022   BETS 1.0 10/11/2022   GAMS 1.3 10/11/2022   MSPIKE Not Observed 10/11/2022   SPEI Comment 10/11/2022   Lab Results  Component Value Date   TIBC 231 (L) 06/22/2023   TIBC 261 03/16/2023   TIBC 274 12/23/2022   FERRITIN 266 06/22/2023   FERRITIN 90 03/16/2023   FERRITIN 162 12/23/2022   IRONPCTSAT 25 06/22/2023   IRONPCTSAT 16 03/16/2023   IRONPCTSAT 36 (H) 12/23/2022   Lab Results  Component Value Date   LDH 157 10/11/2022   LDH 157 05/11/2018     STUDIES:   ECHOCARDIOGRAM COMPLETE Result Date: 06/16/2023    ECHOCARDIOGRAM REPORT   Patient Name:   [name] Date of Exam: 06/16/2023 Medical Rec #:  [id number]     Height:       63.0 in Accession #:    [id number]    Weight:       169.0 lb Date of Birth:  Feb 02, 1951     BSA:          1.800 m Patient Age:    72 years      BP:           100/68 mmHg Patient Gender: F             HR:           65 bpm. [facility] Procedure: 2D Echo, Cardiac Doppler and Color Doppler  Indications:    I50.20* Unspecified systolic (congestive) heart failure; I42.9                 Cardiomyopathy (unspecified)  History:        Patient has prior history of Echocardiogram examinations, most                 recent 01/04/2021. Cardiomegaly and Cardiomyopathy, CAD and                 Previous Myocardial Infarction, Stroke, Pulmonary HTN and                  Anemia, Arrythmias:Atrial Fibrillation; Risk                 Factors:Hypertension, Diabetes, Dyslipidemia and Sleep Apnea.                 Chronic kidney disease.  Sonographer:    [name] Severin RCS, RVS Referring Phys: [id number] Lennart Pall STRADER IMPRESSIONS  1. Left ventricular ejection fraction, by estimation, is 55 to 60%. The left ventricle has normal function. The left ventricle has no regional wall motion abnormalities. There is moderate concentric left ventricular hypertrophy. Left ventricular diastolic function could not be evaluated.  2. Right ventricular systolic function is mildly reduced. The right ventricular size is mildly enlarged. There is severely elevated pulmonary artery systolic pressure. The estimated right ventricular systolic pressure is 88.0 mmHg.  3. Left atrial size was severely dilated.  4. Right atrial size was severely dilated.  5. The mitral valve is abnormal. Mild mitral valve regurgitation. No evidence of mitral stenosis. Severe mitral annular calcification.  6. The tricuspid valve is abnormal. Tricuspid valve regurgitation is moderate to severe.  7. The aortic valve is tricuspid. Aortic valve regurgitation is trivial. No aortic stenosis is present.  8. The inferior vena cava is normal in size but collapsibility could not be evaluated. Comparison(s): Changes from prior study are noted. LV and RV function improved. FINDINGS  Left Ventricle: Left ventricular ejection fraction, by estimation, is 55 to 60%. The left ventricle has normal function. The left ventricle has no regional wall motion abnormalities. The left ventricular internal cavity size was normal in size. There is  moderate concentric left ventricular hypertrophy. Left ventricular diastolic function could not be evaluated due to atrial fibrillation. Left ventricular diastolic function could not be evaluated. Right Ventricle: The right ventricular size is mildly enlarged. No increase in right ventricular wall  thickness. Right ventricular systolic function is mildly reduced. There is severely elevated pulmonary artery systolic pressure. The tricuspid regurgitant velocity is 4.61 m/s, and with an assumed right atrial pressure of 3 mmHg, the estimated right ventricular systolic pressure is 88.0 mmHg. Left Atrium: Left atrial size was severely dilated. Right Atrium: Right atrial size was severely dilated. Pericardium: There is no evidence of pericardial effusion. Mitral Valve: The mitral valve is abnormal. Severe mitral annular calcification. Mild mitral valve regurgitation. No evidence of mitral valve stenosis. MV peak gradient, 8.6 mmHg. The mean mitral valve gradient is 2.0 mmHg. Tricuspid Valve: The tricuspid valve is abnormal. Tricuspid valve regurgitation is moderate to severe. No evidence of tricuspid stenosis. Aortic Valve: The aortic valve is tricuspid. Aortic valve regurgitation is trivial. Aortic regurgitation PHT measures 566 msec. No aortic stenosis is present. Aortic valve mean gradient measures 3.0 mmHg. Aortic valve peak gradient measures 6.5 mmHg. Aortic valve area, by VTI measures 2.99 cm. Pulmonic Valve: The pulmonic valve was normal in structure. Pulmonic valve regurgitation is  trivial. No evidence of pulmonic stenosis. Aorta: The aortic root and ascending aorta are structurally normal, with no evidence of dilitation. Venous: The inferior vena cava is normal in size but collapsibility could not be evaluated. IAS/Shunts: There is right bowing of the interatrial septum, suggestive of elevated left atrial pressure. No atrial level shunt detected by color flow Doppler.  LEFT VENTRICLE PLAX 2D LVIDd:         4.50 cm     Diastology LVIDs:         2.90 cm     LV e' medial:   6.53 cm/s LV PW:         1.40 cm     LV E/e' medial: 21.3 LV IVS:        1.40 cm LVOT diam:     2.00 cm LV SV:         79 LV SV Index:   44 LVOT Area:     3.14 cm  LV Volumes (MOD) LV vol d, MOD A2C: 49.7 ml LV vol d, MOD A4C: 55.2 ml LV  vol s, MOD A2C: 22.7 ml LV vol s, MOD A4C: 27.4 ml LV SV MOD A2C:     27.0 ml LV SV MOD A4C:     55.2 ml LV SV MOD BP:      26.2 ml RIGHT VENTRICLE RV Basal diam:  4.20 cm RV Mid diam:    3.50 cm TAPSE (M-mode): 1.1 cm LEFT ATRIUM            Index        RIGHT ATRIUM           Index LA diam:      5.40 cm  3.00 cm/m   RA Area:     27.60 cm LA Vol (A2C): 120.0 ml 66.66 ml/m  RA Volume:   77.60 ml  43.11 ml/m LA Vol (A4C): 119.0 ml 66.11 ml/m  AORTIC VALVE                    PULMONIC VALVE AV Area (Vmax):    2.70 cm     PV Vmax:          0.82 m/s AV Area (Vmean):   2.88 cm     PV Peak grad:     2.7 mmHg AV Area (VTI):     2.99 cm     PR End Diast Vel: 6.84 msec AV Vmax:           127.00 cm/s AV Vmean:          82.800 cm/s AV VTI:            0.264 m AV Peak Grad:      6.5 mmHg AV Mean Grad:      3.0 mmHg LVOT Vmax:         109.00 cm/s LVOT Vmean:        76.000 cm/s LVOT VTI:          0.251 m LVOT/AV VTI ratio: 0.95 AI PHT:            566 msec  AORTA Ao Root diam: 2.90 cm Ao Asc diam:  3.10 cm MITRAL VALVE                TRICUSPID VALVE MV Area (PHT): 4.39 cm     TR Peak grad:   85.0 mmHg MV Area VTI:   2.19 cm     TR Vmax:  461.00 cm/s MV Peak grad:  8.6 mmHg MV Mean grad:  2.0 mmHg     SHUNTS MV Vmax:       1.47 m/s     Systemic VTI:  0.25 m MV Vmean:      56.4 cm/s    Systemic Diam: 2.00 cm MV Decel Time: 173 msec MV E velocity: 139.00 cm/s MV A velocity: 35.20 cm/s MV E/A ratio:  3.95 Vishnu Priya Mallipeddi Electronically signed by Winfield Rast Mallipeddi Signature Date/Time: 06/16/2023/6:23:55 PM    Final       ASSESSMENT & PLAN:   Assessment:  1.  Normocytic anemia: - She had a history of iron deficiency anemia in the past, status post Injectafer, last given in November 2019. - She had 2 units of blood transfusion during recent hospitalization. - She had tracheotomy done as she cannot tolerate CPAP machine for sleep apnea. - No rectal bleeding.   2.  Social/family history: - She is  seen with her daughter [name] who is her caregiver.  Patient walks with the help of walker.  She is able to dress herself.  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  Quit smoking in 1994. - Sister had breast cancer.  Another sister had leukemia.  Maternal uncle had stomach cancer.  Plan:  1.  Normocytic anemia: -Received 1 g Monoferric on 03/30/2023 with good tolerance. -Labs from 06/22/2023 reveal ferritin of 266, iron saturations 25% and TIBC 231.  Hemoglobin 11.6.  Differential is normal.  Vitamin B12 level is 610.  Platelet count 135. -Denies any overt bleeding. -B12 level has decreased to 1152.  -No additional IV iron needed at this time. -If hemoglobin and platelets continue to trend down despite adequate iron and B12 levels, would recommend additional workup with possible bone marrow biopsy. -Follow-up in 4 months with labs a few days before and office visit.  2. General health Maintenance: -Mammogram from 10/26/2022 showed BI-RADS Category 0 due to calcifications. -Repeat mammogram from 11/08/2022 was read as BI-RADS Category 1 negative. -Repeat mammogram in April/May 2025.  3.  Thrombocytopenia: -Platelet count has been intermittently low over the past 2 months ranging from 130-148.  -Had CT abdomen on 04/29/2023 which did not reveal any hepatosplenomegaly.  Patient expressed understanding of the plan provided.   PLAN SUMMARY: >> Mammogram in 2025. >> Return to clinic in 4 months with labs a few days before and in office visit.    I provided 16 minutes of non face-to-face telephone visit time during this encounter, and > 50% was spent counseling as documented under my assessment & plan.   Durenda Hurt, NP 06/29/2023 10:54 AM

## 2023-06-30 ENCOUNTER — Other Ambulatory Visit: Payer: Self-pay | Admitting: Family Medicine

## 2023-06-30 ENCOUNTER — Encounter: Payer: Self-pay | Admitting: Physician Assistant

## 2023-07-06 ENCOUNTER — Encounter: Payer: Self-pay | Admitting: Gastroenterology

## 2023-07-07 ENCOUNTER — Other Ambulatory Visit: Payer: Self-pay | Admitting: Family Medicine

## 2023-07-20 ENCOUNTER — Encounter: Payer: Self-pay | Admitting: Cardiology

## 2023-07-24 ENCOUNTER — Telehealth: Payer: Self-pay | Admitting: *Deleted

## 2023-07-24 NOTE — Telephone Encounter (Signed)
Pt has contacted our office about a procedure she states she is scheduled for 07/27/23. See My chart messages. Our office has not received a clearance request yet for this pt. The requesting office is closed today for [employer] observance day. We will have to try and reach the office tomorrow to request that our office needs a clearance request sent to our office.

## 2023-07-25 NOTE — Telephone Encounter (Signed)
Patient with diagnosis of afib on Eliquis for anticoagulation.    Procedure:  MICROLARYNGOSCOPY REMOVAL OF GRANULATION AND DILATE THE TRACHEA  Date of procedure: 07/27/23   CHA2DS2-VASc Score = 7   This indicates a 11.2% annual risk of stroke. The patient's score is based upon: CHF History: 1 HTN History: 1 Diabetes History: 1 Stroke History: 2 Vascular Disease History: 0 Age Score: 1 Gender Score: 1      CrCl 44 ml/min Platelet count 135  Per office protocol, patient can hold Eliquis for 1 day prior to procedure.    **This guidance is not considered finalized until pre-operative APP has relayed final recommendations.**

## 2023-07-25 NOTE — Telephone Encounter (Signed)
   Pre-operative Risk Assessment    Patient Name: [name]  DOB: 1951/02/05 MRN: [id number]   Date of last office visit: 05/30/23 Randall An, [facility] [place] Date of next office visit: 08/15/23 DR. ARIDA   Request for Surgical Clearance    Procedure:   MICROLARYNGOSCOPY REMOVAL OF GRANULATION AND DILATE THE TRACHEA  Date of Surgery:  Clearance 07/27/23                                Surgeon:  DR. Harriette Ohara Surgeon's Group or Practice Name:  [facility]  Phone number:  [phone] Fax number:  [phone]   Type of Clearance Requested:   - Medical  - Pharmacy:  Hold Apixaban (Eliquis)     Type of Anesthesia:   KENALOG INJECTION   Additional requests/questions:    Elpidio Anis   07/25/2023, 11:04 AM

## 2023-07-25 NOTE — Telephone Encounter (Addendum)
   Patient Name: [name]  DOB: [birth year] MRN: [id number]  Primary Cardiologist: Dina Rich, MD  Chart reviewed as part of pre-operative protocol coverage. Pt was called in reference to surgical clearance request. No answer. Left voicemail for pt to call back at earliest convenience.   Pt was last seen in the office on 05/30/2023 and was stable from a cardiac standpoint. Pt needs phone call to assess symptoms/METS.  Per office protocol, patient can hold Eliquis for 1 day prior to procedure.    ADDENDUM 07/28/2023: In reviewing chart, pt underwent surgery on 07/27/2023. I will remove this request from the preop pool as clearance is no longer needed.   Joylene Grapes, NP 07/25/2023, 2:12 PM

## 2023-07-27 ENCOUNTER — Other Ambulatory Visit: Payer: Self-pay | Admitting: Family Medicine

## 2023-07-27 ENCOUNTER — Other Ambulatory Visit: Payer: Self-pay | Admitting: Gastroenterology

## 2023-07-27 DIAGNOSIS — K580 Irritable bowel syndrome with diarrhea: Secondary | ICD-10-CM

## 2023-07-27 NOTE — Telephone Encounter (Signed)
Too soon refilled this 2 weeks ago

## 2023-08-04 LAB — LAB REPORT - SCANNED
Free T4: 1.64 ng/dL
TSH: 0.64 (ref 0.41–5.90)

## 2023-08-05 ENCOUNTER — Emergency Department (HOSPITAL_COMMUNITY): Payer: PPO

## 2023-08-05 ENCOUNTER — Other Ambulatory Visit: Payer: Self-pay

## 2023-08-05 ENCOUNTER — Inpatient Hospital Stay (HOSPITAL_COMMUNITY)
Admission: EM | Admit: 2023-08-05 | Discharge: 2023-08-08 | DRG: 208 | Disposition: A | Discharge status: Home Health | Payer: PPO | Attending: Internal Medicine | Admitting: Internal Medicine

## 2023-08-05 DIAGNOSIS — Z90710 Acquired absence of both cervix and uterus: Secondary | ICD-10-CM

## 2023-08-05 DIAGNOSIS — Z888 Allergy status to other drugs, medicaments and biological substances status: Secondary | ICD-10-CM

## 2023-08-05 DIAGNOSIS — R569 Unspecified convulsions: Secondary | ICD-10-CM

## 2023-08-05 DIAGNOSIS — Z9911 Dependence on respirator [ventilator] status: Secondary | ICD-10-CM | POA: Diagnosis not present

## 2023-08-05 DIAGNOSIS — I130 Hypertensive heart and chronic kidney disease with heart failure and stage 1 through stage 4 chronic kidney disease, or unspecified chronic kidney disease: Secondary | ICD-10-CM | POA: Diagnosis present

## 2023-08-05 DIAGNOSIS — R413 Other amnesia: Secondary | ICD-10-CM | POA: Diagnosis present

## 2023-08-05 DIAGNOSIS — I480 Paroxysmal atrial fibrillation: Secondary | ICD-10-CM | POA: Diagnosis present

## 2023-08-05 DIAGNOSIS — I2721 Secondary pulmonary arterial hypertension: Secondary | ICD-10-CM | POA: Diagnosis present

## 2023-08-05 DIAGNOSIS — Z885 Allergy status to narcotic agent status: Secondary | ICD-10-CM

## 2023-08-05 DIAGNOSIS — J386 Stenosis of larynx: Secondary | ICD-10-CM | POA: Diagnosis present

## 2023-08-05 DIAGNOSIS — Z1152 Encounter for screening for COVID-19: Secondary | ICD-10-CM | POA: Diagnosis not present

## 2023-08-05 DIAGNOSIS — F0390 Unspecified dementia without behavioral disturbance: Secondary | ICD-10-CM | POA: Diagnosis present

## 2023-08-05 DIAGNOSIS — Z79899 Other long term (current) drug therapy: Secondary | ICD-10-CM

## 2023-08-05 DIAGNOSIS — N1831 Chronic kidney disease, stage 3a: Secondary | ICD-10-CM | POA: Diagnosis present

## 2023-08-05 DIAGNOSIS — R042 Hemoptysis: Secondary | ICD-10-CM | POA: Diagnosis present

## 2023-08-05 DIAGNOSIS — J9612 Chronic respiratory failure with hypercapnia: Secondary | ICD-10-CM | POA: Diagnosis not present

## 2023-08-05 DIAGNOSIS — I2723 Pulmonary hypertension due to lung diseases and hypoxia: Secondary | ICD-10-CM | POA: Diagnosis present

## 2023-08-05 DIAGNOSIS — E039 Hypothyroidism, unspecified: Secondary | ICD-10-CM | POA: Diagnosis present

## 2023-08-05 DIAGNOSIS — E119 Type 2 diabetes mellitus without complications: Secondary | ICD-10-CM | POA: Diagnosis not present

## 2023-08-05 DIAGNOSIS — J95851 Ventilator associated pneumonia: Secondary | ICD-10-CM | POA: Diagnosis present

## 2023-08-05 DIAGNOSIS — E1122 Type 2 diabetes mellitus with diabetic chronic kidney disease: Secondary | ICD-10-CM | POA: Diagnosis present

## 2023-08-05 DIAGNOSIS — I4891 Unspecified atrial fibrillation: Secondary | ICD-10-CM | POA: Diagnosis not present

## 2023-08-05 DIAGNOSIS — D6959 Other secondary thrombocytopenia: Secondary | ICD-10-CM | POA: Diagnosis present

## 2023-08-05 DIAGNOSIS — J189 Pneumonia, unspecified organism: Principal | ICD-10-CM | POA: Diagnosis present

## 2023-08-05 DIAGNOSIS — E1143 Type 2 diabetes mellitus with diabetic autonomic (poly)neuropathy: Secondary | ICD-10-CM | POA: Diagnosis present

## 2023-08-05 DIAGNOSIS — J398 Other specified diseases of upper respiratory tract: Secondary | ICD-10-CM | POA: Diagnosis present

## 2023-08-05 DIAGNOSIS — I5042 Chronic combined systolic (congestive) and diastolic (congestive) heart failure: Secondary | ICD-10-CM | POA: Diagnosis present

## 2023-08-05 DIAGNOSIS — M797 Fibromyalgia: Secondary | ICD-10-CM | POA: Diagnosis present

## 2023-08-05 DIAGNOSIS — M1990 Unspecified osteoarthritis, unspecified site: Secondary | ICD-10-CM | POA: Diagnosis present

## 2023-08-05 DIAGNOSIS — I252 Old myocardial infarction: Secondary | ICD-10-CM

## 2023-08-05 DIAGNOSIS — E662 Morbid (severe) obesity with alveolar hypoventilation: Secondary | ICD-10-CM | POA: Diagnosis present

## 2023-08-05 DIAGNOSIS — D509 Iron deficiency anemia, unspecified: Secondary | ICD-10-CM | POA: Diagnosis present

## 2023-08-05 DIAGNOSIS — K580 Irritable bowel syndrome with diarrhea: Secondary | ICD-10-CM | POA: Diagnosis present

## 2023-08-05 DIAGNOSIS — K3184 Gastroparesis: Secondary | ICD-10-CM | POA: Diagnosis present

## 2023-08-05 DIAGNOSIS — Z930 Tracheostomy status: Secondary | ICD-10-CM

## 2023-08-05 DIAGNOSIS — J9502 Infection of tracheostomy stoma: Secondary | ICD-10-CM | POA: Diagnosis present

## 2023-08-05 DIAGNOSIS — Z87891 Personal history of nicotine dependence: Secondary | ICD-10-CM

## 2023-08-05 DIAGNOSIS — K529 Noninfective gastroenteritis and colitis, unspecified: Secondary | ICD-10-CM | POA: Diagnosis present

## 2023-08-05 DIAGNOSIS — G40909 Epilepsy, unspecified, not intractable, without status epilepticus: Secondary | ICD-10-CM | POA: Diagnosis present

## 2023-08-05 DIAGNOSIS — F419 Anxiety disorder, unspecified: Secondary | ICD-10-CM | POA: Diagnosis present

## 2023-08-05 DIAGNOSIS — A419 Sepsis, unspecified organism: Secondary | ICD-10-CM | POA: Diagnosis present

## 2023-08-05 DIAGNOSIS — J9611 Chronic respiratory failure with hypoxia: Secondary | ICD-10-CM | POA: Diagnosis present

## 2023-08-05 DIAGNOSIS — Z79890 Hormone replacement therapy (postmenopausal): Secondary | ICD-10-CM

## 2023-08-05 DIAGNOSIS — J9622 Acute and chronic respiratory failure with hypercapnia: Secondary | ICD-10-CM | POA: Diagnosis present

## 2023-08-05 DIAGNOSIS — Z794 Long term (current) use of insulin: Secondary | ICD-10-CM

## 2023-08-05 DIAGNOSIS — Z6830 Body mass index (BMI) 30.0-30.9, adult: Secondary | ICD-10-CM

## 2023-08-05 DIAGNOSIS — Y848 Other medical procedures as the cause of abnormal reaction of the patient, or of later complication, without mention of misadventure at the time of the procedure: Secondary | ICD-10-CM | POA: Diagnosis present

## 2023-08-05 DIAGNOSIS — K219 Gastro-esophageal reflux disease without esophagitis: Secondary | ICD-10-CM | POA: Diagnosis present

## 2023-08-05 DIAGNOSIS — M545 Low back pain, unspecified: Secondary | ICD-10-CM | POA: Diagnosis present

## 2023-08-05 DIAGNOSIS — I4811 Longstanding persistent atrial fibrillation: Secondary | ICD-10-CM | POA: Diagnosis not present

## 2023-08-05 DIAGNOSIS — E872 Acidosis, unspecified: Secondary | ICD-10-CM | POA: Diagnosis present

## 2023-08-05 DIAGNOSIS — Z7984 Long term (current) use of oral hypoglycemic drugs: Secondary | ICD-10-CM

## 2023-08-05 DIAGNOSIS — Z7901 Long term (current) use of anticoagulants: Secondary | ICD-10-CM

## 2023-08-05 DIAGNOSIS — I482 Chronic atrial fibrillation, unspecified: Secondary | ICD-10-CM | POA: Diagnosis present

## 2023-08-05 DIAGNOSIS — G4733 Obstructive sleep apnea (adult) (pediatric): Secondary | ICD-10-CM | POA: Diagnosis not present

## 2023-08-05 DIAGNOSIS — G8929 Other chronic pain: Secondary | ICD-10-CM | POA: Diagnosis present

## 2023-08-05 DIAGNOSIS — R490 Dysphonia: Secondary | ICD-10-CM | POA: Diagnosis present

## 2023-08-05 DIAGNOSIS — K2270 Barrett's esophagus without dysplasia: Secondary | ICD-10-CM | POA: Diagnosis present

## 2023-08-05 DIAGNOSIS — E11649 Type 2 diabetes mellitus with hypoglycemia without coma: Secondary | ICD-10-CM | POA: Diagnosis present

## 2023-08-05 DIAGNOSIS — E785 Hyperlipidemia, unspecified: Secondary | ICD-10-CM | POA: Diagnosis present

## 2023-08-05 DIAGNOSIS — Y838 Other surgical procedures as the cause of abnormal reaction of the patient, or of later complication, without mention of misadventure at the time of the procedure: Secondary | ICD-10-CM | POA: Diagnosis present

## 2023-08-05 DIAGNOSIS — J9621 Acute and chronic respiratory failure with hypoxia: Secondary | ICD-10-CM | POA: Diagnosis present

## 2023-08-05 DIAGNOSIS — Z8249 Family history of ischemic heart disease and other diseases of the circulatory system: Secondary | ICD-10-CM

## 2023-08-05 DIAGNOSIS — Z20828 Contact with and (suspected) exposure to other viral communicable diseases: Secondary | ICD-10-CM | POA: Diagnosis present

## 2023-08-05 DIAGNOSIS — I10 Essential (primary) hypertension: Secondary | ICD-10-CM | POA: Diagnosis present

## 2023-08-05 DIAGNOSIS — F919 Conduct disorder, unspecified: Secondary | ICD-10-CM | POA: Diagnosis present

## 2023-08-05 DIAGNOSIS — Z820 Family history of epilepsy and other diseases of the nervous system: Secondary | ICD-10-CM

## 2023-08-05 LAB — CBG MONITORING, ED: Glucose-Capillary: 126 mg/dL — ABNORMAL HIGH (ref 70–99)

## 2023-08-05 LAB — COMPREHENSIVE METABOLIC PANEL
ALT: 16 U/L (ref 0–44)
AST: 18 U/L (ref 15–41)
Albumin: 3.3 g/dL — ABNORMAL LOW (ref 3.5–5.0)
Alkaline Phosphatase: 104 U/L (ref 38–126)
Anion gap: 10 (ref 5–15)
BUN: 23 mg/dL (ref 8–23)
CO2: 17 mmol/L — ABNORMAL LOW (ref 22–32)
Calcium: 8.8 mg/dL — ABNORMAL LOW (ref 8.9–10.3)
Chloride: 111 mmol/L (ref 98–111)
Creatinine, Ser: 1.25 mg/dL — ABNORMAL HIGH (ref 0.44–1.00)
GFR, Estimated: 45 mL/min — ABNORMAL LOW (ref >60)
Glucose, Bld: 120 mg/dL — ABNORMAL HIGH (ref 70–99)
Potassium: 4.2 mmol/L (ref 3.5–5.1)
Sodium: 138 mmol/L (ref 135–145)
Total Bilirubin: 1.2 mg/dL (ref 0.0–1.2)
Total Protein: 6.8 g/dL (ref 6.5–8.1)

## 2023-08-05 LAB — CBC WITH DIFFERENTIAL/PLATELET
Abs Immature Granulocytes: 0.08 10*3/uL — ABNORMAL HIGH (ref 0.00–0.07)
Basophils Absolute: 0.1 10*3/uL (ref 0.0–0.1)
Basophils Relative: 0 %
Eosinophils Absolute: 0.1 10*3/uL (ref 0.0–0.5)
Eosinophils Relative: 0 %
HCT: 34.3 % — ABNORMAL LOW (ref 36.0–46.0)
Hemoglobin: 10.3 g/dL — ABNORMAL LOW (ref 12.0–15.0)
Immature Granulocytes: 1 %
Lymphocytes Relative: 12 %
Lymphs Abs: 1.9 10*3/uL (ref 0.7–4.0)
MCH: 30.3 pg (ref 26.0–34.0)
MCHC: 30 g/dL (ref 30.0–36.0)
MCV: 100.9 fL — ABNORMAL HIGH (ref 80.0–100.0)
Monocytes Absolute: 1.2 10*3/uL — ABNORMAL HIGH (ref 0.1–1.0)
Monocytes Relative: 8 %
Neutro Abs: 12.3 10*3/uL — ABNORMAL HIGH (ref 1.7–7.7)
Neutrophils Relative %: 79 %
Platelets: 137 10*3/uL — ABNORMAL LOW (ref 150–400)
RBC: 3.4 MIL/uL — ABNORMAL LOW (ref 3.87–5.11)
RDW: 15.7 % — ABNORMAL HIGH (ref 11.5–15.5)
WBC: 15.6 10*3/uL — ABNORMAL HIGH (ref 4.0–10.5)
nRBC: 0 % (ref 0.0–0.2)

## 2023-08-05 LAB — CBC
HCT: 35.1 % — ABNORMAL LOW (ref 36.0–46.0)
Hemoglobin: 10.7 g/dL — ABNORMAL LOW (ref 12.0–15.0)
MCH: 30.6 pg (ref 26.0–34.0)
MCHC: 30.5 g/dL (ref 30.0–36.0)
MCV: 100.3 fL — ABNORMAL HIGH (ref 80.0–100.0)
Platelets: 141 10*3/uL — ABNORMAL LOW (ref 150–400)
RBC: 3.5 MIL/uL — ABNORMAL LOW (ref 3.87–5.11)
RDW: 15.8 % — ABNORMAL HIGH (ref 11.5–15.5)
WBC: 17.5 10*3/uL — ABNORMAL HIGH (ref 4.0–10.5)
nRBC: 0 % (ref 0.0–0.2)

## 2023-08-05 LAB — RESP PANEL BY RT-PCR (RSV, FLU A&B, COVID)  RVPGX2
Influenza A by PCR: NEGATIVE
Influenza B by PCR: NEGATIVE
Resp Syncytial Virus by PCR: NEGATIVE
SARS Coronavirus 2 by RT PCR: NEGATIVE

## 2023-08-05 LAB — RESPIRATORY PANEL BY PCR

## 2023-08-05 LAB — CREATININE, SERUM
Creatinine, Ser: 1.16 mg/dL — ABNORMAL HIGH (ref 0.44–1.00)
GFR, Estimated: 49 mL/min — ABNORMAL LOW (ref >60)

## 2023-08-05 LAB — TROPONIN I (HIGH SENSITIVITY)
Troponin I (High Sensitivity): 54 ng/L — ABNORMAL HIGH (ref <18)
Troponin I (High Sensitivity): 54 ng/L — ABNORMAL HIGH (ref <18)

## 2023-08-05 LAB — PROTIME-INR
INR: 1.7 — ABNORMAL HIGH (ref 0.8–1.2)
Prothrombin Time: 20.2 s — ABNORMAL HIGH (ref 11.4–15.2)

## 2023-08-05 LAB — LACTIC ACID, PLASMA
Lactic Acid, Venous: 0.9 mmol/L (ref 0.5–1.9)
Lactic Acid, Venous: 1.5 mmol/L (ref 0.5–1.9)

## 2023-08-05 LAB — PROCALCITONIN: Procalcitonin: 2.4 ng/mL

## 2023-08-05 LAB — GLUCOSE, CAPILLARY
Glucose-Capillary: 115 mg/dL — ABNORMAL HIGH (ref 70–99)
Glucose-Capillary: 123 mg/dL — ABNORMAL HIGH (ref 70–99)

## 2023-08-05 LAB — PHOSPHORUS: Phosphorus: 3.2 mg/dL (ref 2.5–4.6)

## 2023-08-05 LAB — MAGNESIUM: Magnesium: 2 mg/dL (ref 1.7–2.4)

## 2023-08-05 LAB — D-DIMER, QUANTITATIVE: D-Dimer, Quant: 1.86 ug{FEU}/mL — ABNORMAL HIGH (ref 0.00–0.50)

## 2023-08-05 LAB — MRSA NEXT GEN BY PCR, NASAL: MRSA by PCR Next Gen: NOT DETECTED

## 2023-08-05 LAB — BRAIN NATRIURETIC PEPTIDE: B Natriuretic Peptide: 617.8 pg/mL — ABNORMAL HIGH (ref 0.0–100.0)

## 2023-08-05 MED ORDER — SODIUM CHLORIDE 0.9 % IV SOLN
1.0000 g | Freq: Once | INTRAVENOUS | Status: AC
  Administered 2023-08-05: 1 g via INTRAVENOUS
  Filled 2023-08-05: qty 10

## 2023-08-05 MED ORDER — INSULIN ASPART 100 UNIT/ML IJ SOLN
0.0000 [IU] | Freq: Every day | INTRAMUSCULAR | Status: DC
  Administered 2023-08-06: 2 [IU] via SUBCUTANEOUS

## 2023-08-05 MED ORDER — TRAMADOL HCL 50 MG PO TABS
50.0000 mg | ORAL_TABLET | Freq: Three times a day (TID) | ORAL | Status: DC | PRN
  Administered 2023-08-06 – 2023-08-07 (×4): 50 mg via ORAL
  Filled 2023-08-05 (×4): qty 1

## 2023-08-05 MED ORDER — DICYCLOMINE HCL 10 MG PO CAPS
10.0000 mg | ORAL_CAPSULE | Freq: Three times a day (TID) | ORAL | Status: DC
Start: 2023-08-05 — End: 2023-08-08
  Administered 2023-08-05 – 2023-08-08 (×11): 10 mg via ORAL
  Filled 2023-08-05 (×13): qty 1

## 2023-08-05 MED ORDER — GABAPENTIN 100 MG PO CAPS
100.0000 mg | ORAL_CAPSULE | Freq: Three times a day (TID) | ORAL | Status: DC
Start: 2023-08-05 — End: 2023-08-08
  Administered 2023-08-05 – 2023-08-08 (×8): 100 mg via ORAL
  Filled 2023-08-05 (×8): qty 1

## 2023-08-05 MED ORDER — ATORVASTATIN CALCIUM 40 MG PO TABS
40.0000 mg | ORAL_TABLET | Freq: Every day | ORAL | Status: DC
  Administered 2023-08-06 – 2023-08-08 (×3): 40 mg via ORAL
  Filled 2023-08-05 (×3): qty 1

## 2023-08-05 MED ORDER — LEVOTHYROXINE SODIUM 75 MCG PO TABS
150.0000 ug | ORAL_TABLET | Freq: Every day | ORAL | Status: DC
  Administered 2023-08-06 – 2023-08-08 (×3): 150 ug via ORAL
  Filled 2023-08-05 (×3): qty 2

## 2023-08-05 MED ORDER — PANTOPRAZOLE SODIUM 40 MG PO TBEC
40.0000 mg | DELAYED_RELEASE_TABLET | Freq: Two times a day (BID) | ORAL | Status: DC
  Administered 2023-08-06 – 2023-08-08 (×5): 40 mg via ORAL
  Filled 2023-08-05 (×5): qty 1

## 2023-08-05 MED ORDER — SODIUM CHLORIDE 0.9 % IV SOLN
2.0000 g | INTRAVENOUS | Status: DC
  Administered 2023-08-06 – 2023-08-08 (×3): 2 g via INTRAVENOUS
  Filled 2023-08-05 (×3): qty 20

## 2023-08-05 MED ORDER — SERTRALINE HCL 50 MG PO TABS
50.0000 mg | ORAL_TABLET | Freq: Every day | ORAL | Status: DC
  Administered 2023-08-06 – 2023-08-08 (×3): 50 mg via ORAL
  Filled 2023-08-05 (×3): qty 1

## 2023-08-05 MED ORDER — SODIUM CHLORIDE 0.9 % IV SOLN
500.0000 mg | INTRAVENOUS | Status: DC
  Administered 2023-08-06 – 2023-08-07 (×2): 500 mg via INTRAVENOUS
  Filled 2023-08-05 (×3): qty 5

## 2023-08-05 MED ORDER — ACETAMINOPHEN 325 MG PO TABS
650.0000 mg | ORAL_TABLET | Freq: Once | ORAL | Status: AC
  Administered 2023-08-05: 650 mg via ORAL
  Filled 2023-08-05: qty 2

## 2023-08-05 MED ORDER — LEVETIRACETAM 250 MG PO TABS
500.0000 mg | ORAL_TABLET | Freq: Two times a day (BID) | ORAL | Status: DC
Start: 2023-08-05 — End: 2023-08-08
  Administered 2023-08-05 – 2023-08-08 (×6): 500 mg via ORAL
  Filled 2023-08-05 (×6): qty 2

## 2023-08-05 MED ORDER — LACTATED RINGERS IV SOLN: INTRAVENOUS | Status: AC

## 2023-08-05 MED ORDER — LORAZEPAM 0.5 MG PO TABS
0.5000 mg | ORAL_TABLET | Freq: Four times a day (QID) | ORAL | Status: DC | PRN
  Administered 2023-08-05 – 2023-08-07 (×4): 0.5 mg via ORAL
  Filled 2023-08-05 (×5): qty 1

## 2023-08-05 MED ORDER — FAMOTIDINE 20 MG PO TABS
20.0000 mg | ORAL_TABLET | Freq: Two times a day (BID) | ORAL | Status: DC
  Administered 2023-08-06 – 2023-08-08 (×5): 20 mg via ORAL
  Filled 2023-08-05 (×5): qty 1

## 2023-08-05 MED ORDER — CHLORHEXIDINE GLUCONATE CLOTH 2 % EX PADS
6.0000 | MEDICATED_PAD | Freq: Every day | CUTANEOUS | Status: DC
  Administered 2023-08-05 – 2023-08-08 (×4): 6 via TOPICAL

## 2023-08-05 MED ORDER — DOCUSATE SODIUM 100 MG PO CAPS: 100.0000 mg | ORAL_CAPSULE | Freq: Two times a day (BID) | ORAL | Status: DC | PRN

## 2023-08-05 MED ORDER — INSULIN ASPART 100 UNIT/ML IJ SOLN
0.0000 [IU] | Freq: Three times a day (TID) | INTRAMUSCULAR | Status: DC
  Administered 2023-08-07 – 2023-08-08 (×4): 3 [IU] via SUBCUTANEOUS
  Administered 2023-08-08: 5 [IU] via SUBCUTANEOUS

## 2023-08-05 MED ORDER — SODIUM CHLORIDE 0.9 % IV SOLN
500.0000 mg | Freq: Once | INTRAVENOUS | Status: AC
  Administered 2023-08-05: 500 mg via INTRAVENOUS
  Filled 2023-08-05: qty 5

## 2023-08-05 MED ORDER — METOPROLOL TARTRATE 25 MG PO TABS
25.0000 mg | ORAL_TABLET | Freq: Two times a day (BID) | ORAL | Status: DC
Start: 2023-08-05 — End: 2023-08-08
  Administered 2023-08-05 – 2023-08-08 (×6): 25 mg via ORAL
  Filled 2023-08-05 (×6): qty 1

## 2023-08-05 MED ORDER — FAMOTIDINE 20 MG PO TABS
20.0000 mg | ORAL_TABLET | Freq: Two times a day (BID) | ORAL | Status: DC
  Administered 2023-08-05: 20 mg
  Filled 2023-08-05: qty 1

## 2023-08-05 MED ORDER — HEPARIN SODIUM (PORCINE) 5000 UNIT/ML IJ SOLN
5000.0000 [IU] | Freq: Three times a day (TID) | INTRAMUSCULAR | Status: DC
  Administered 2023-08-05 – 2023-08-07 (×5): 5000 [IU] via SUBCUTANEOUS
  Filled 2023-08-05 (×5): qty 1

## 2023-08-05 MED ORDER — BUSPIRONE HCL 10 MG PO TABS
30.0000 mg | ORAL_TABLET | Freq: Two times a day (BID) | ORAL | Status: DC
  Administered 2023-08-05 – 2023-08-08 (×6): 30 mg via ORAL
  Filled 2023-08-05 (×6): qty 3

## 2023-08-05 MED ORDER — NITROGLYCERIN 0.4 MG SL SUBL: 0.4000 mg | SUBLINGUAL_TABLET | SUBLINGUAL | Status: DC | PRN

## 2023-08-05 MED ORDER — DONEPEZIL HCL 5 MG PO TABS
5.0000 mg | ORAL_TABLET | Freq: Every day | ORAL | Status: DC
  Administered 2023-08-05 – 2023-08-07 (×3): 5 mg via ORAL
  Filled 2023-08-05 (×4): qty 1

## 2023-08-05 MED ORDER — IPRATROPIUM-ALBUTEROL 0.5-2.5 (3) MG/3ML IN SOLN
3.0000 mL | Freq: Four times a day (QID) | RESPIRATORY_TRACT | Status: DC
  Administered 2023-08-05 – 2023-08-07 (×8): 3 mL via RESPIRATORY_TRACT
  Filled 2023-08-05 (×9): qty 3

## 2023-08-05 MED ORDER — POLYETHYLENE GLYCOL 3350 17 G PO PACK: 17.0000 g | PACK | Freq: Every day | ORAL | Status: DC | PRN

## 2023-08-05 MED ORDER — EMPAGLIFLOZIN 10 MG PO TABS
10.0000 mg | ORAL_TABLET | Freq: Every day | ORAL | Status: DC
  Administered 2023-08-06 – 2023-08-08 (×3): 10 mg via ORAL
  Filled 2023-08-05 (×3): qty 1

## 2023-08-05 NOTE — Progress Notes (Signed)
eLink Physician-Brief Progress Note Patient Name: [name] DOB: [birth year] MRN: [id number]   Date of Service  08/05/2023  HPI/Events of Note  73 year old female who is trach dep in setting of OSA/OHS and intolerance of NIPPV. 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  Underwent debridement of her tissue and has stable bleeding.  Patient normally takes Tylenol twice daily scheduled.  Currently not ordered by the primary team.  eICU Interventions  Given acute illness, would prefer to hold Tylenol unless there is an acute decompensation such as severe fever, pain, or other acute reason for Tylenol.  Maintain current orders.   4098 -extended respiratory pathogen panel is negative.  Will discontinue testing precautions.   Intervention Category Minor Interventions: Routine modifications to care plan (e.g. PRN medications for pain, fever)  Mahira Petras 08/05/2023, 9:48 PM

## 2023-08-05 NOTE — H&P (Signed)
NAME:  [name], MRN:  [id number], DOB:  [birth year], LOS: 0 ADMISSION DATE:  08/05/2023, CONSULTATION DATE:  2/1 REFERRING MD:  plunkett, CHIEF COMPLAINT:  PNA and hemoptysis    History of Present Illness:  73 year old female who is trach dep in setting of OSA/OHS and intolerance of NIPPV. Also has sig h/o subglottic stenosis which has made her phonic and unable to tolerate PMV. She is followed at [facility] for this. Just recently underwent microlaryngoscopy, removal of granulation tissue and tracheal dilation by DR Delford Field on 1/23. This was tolerated well. She was discharged home that day and she had been doing well, actually able to speak and tolerating increased time using PMV at home. On 1/31 had some bloody secretions from trach during suction. Later that evening coughed up old dark blood clot about dime sized. 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 (HCC), Obesity hypoventilation syndrome (HCC) WHO group 3 pulmonary arterial hypertension (HCC),Gastroparesis, HFpEF, RV dysfxn, chronic afib CHADSvasc score 5 on DOAC, iron def anemia  Significant Hospital Events: Including procedures, antibiotic start and stop dates in addition to other pertinent events   2/1 admitted w/ hemoptysis and PNA sputum sent started on CAP coverage   Interim History / Subjective:  Feels a little sob   Objective   Blood pressure 98/79, pulse 78, temperature (!) 100.5 F (38.1 C), temperature source Oral, resp. rate 18, SpO2 92%.       No intake or output data in the 24 hours ending 08/05/23 1602 There were no vitals filed for this visit.  Examination: General: 73 year old female resting in bed. WOB a little increased from  baseline. Not in distress but clearly working harder than usual  HENT: face is flushed. MMM. [name] is midline has # cuffed. Able to phonate past tracheal secretions w/ cuff deflated Lungs: some scattered rhonchi. PCXR personally reviewed Right sided infiltrate which is new  Cardiovascular: Reg irreg af on tele  Abdomen: soft not tender  Extremities: warm trace LE edema brisk CR Neuro: awake oriented currently at baseline fxn  GU: voids   Resolved Hospital Problem list     Assessment & Plan:   Trach/nocturnal vent dependent 2/2 severe OSA/OHS and WHO group 3 Pulmonary hypertension with Chronic hypoxic/hypercarbic resp failure  Plan Continue routine trach care Cont supplemental oxygen via ATC during day and use home vent at HS  VAP bundle  Scheduled BDs Cont pulse ox   Sepsis due to CAP vs aspiration w/ Hemoptysis. Treat as CAP w/ resultant hemoptysis. alternatively had recent removal of granulation tissue so also consider bleeding from granulation site and aspiration  Plan Hold her DOAC for 24-48 hrs Send sputum and PCT Start empiric abx  Check lactic acid If hemoptysis worse would try inhaled TXA via facemask neb (not trach as area of concern would be above the distal end of trach)  h/o tracheal stenosis. Now s/p microlaryngoscopy, removal of granulation tissue and tracheal dilation Plan PMV during waking hours and only under direct supervision for now   NAGMA superimposed on CKD Stage 3a - GFR 45 to 59 (Mildly to moderately decreased) Plan Cont IVFs w/ LR Renal dose meds Strict I&O Am chem   Chronic AF CHADSvasc  5 Plan Cont tele  Rate control  Holding ac as above for now   HFpEF Plan Cont tele, Lopressor, and her Jardiance  Hold lasix today  Gastroparesis Plan Cont bid ppi Cont bentyl 10mg  ac and hs  insulin dep Diabetes Plan Ssi  Glucose goal 140-180  Hypothyroidism  Plan Cont Synthroid   mod dementia w/ h/o behavior disorder and memory loss  -her  memory is actually very good and really only exacerbated when has acute infection or is hypercarbic Plan Cont her home meds: Zoloft daily, buspar and Neurontin   h/o seizure d/o Plan Cont Keppra  Best Practice (right click and "Reselect all SmartList Selections" daily)   Diet/type: Regular consistency (see orders) DVT prophylaxis prophylactic heparin  Pressure ulcer(s): N/A GI prophylaxis: PPI Lines: N/A Foley:  N/A Code Status:  full code Last date of multidisciplinary goals of care discussion [pending]  Labs   CBC: Recent Labs  Lab 08/05/23 1453  WBC 15.6*  NEUTROABS 12.3*  HGB 10.3*  HCT 34.3*  MCV 100.9*  PLT 137*    Basic Metabolic Panel: No results for input(s): "NA", "K", "CL", "CO2", "GLUCOSE", "BUN", "CREATININE", "CALCIUM", "MG", "PHOS" in the last 168 hours. GFR: CrCl cannot be calculated (Patient's most recent lab result is older than the maximum 21 days allowed.). Recent Labs  Lab 08/05/23 1453  WBC 15.6*    Liver Function Tests: No results for input(s): "AST", "ALT", "ALKPHOS", "BILITOT", "PROT", "ALBUMIN" in the last 168 hours. No results for input(s): "LIPASE", "AMYLASE" in the last 168 hours. No results for input(s): "AMMONIA" in the last 168 hours.  ABG    Component Value Date/Time   PHART 7.554 (H) 01/03/2021 2326   PCO2ART 38.1 01/03/2021 2326   PO2ART 270 (H) 01/03/2021 2326   HCO3 33.6 (H) 01/03/2021 2326   TCO2 35 (H) 01/03/2021 2326   ACIDBASEDEF 1.0 01/26/2015 1132   O2SAT 100.0 01/03/2021 2326     Coagulation Profile: No results for input(s): "INR", "PROTIME" in the last 168 hours.  Cardiac Enzymes: No results for input(s): "CKTOTAL", "CKMB", "CKMBINDEX", "TROPONINI" in the last 168 hours.  HbA1C: Hgb A1c MFr Bld  Date/Time Value Ref Range Status  03/30/2023 11:17 AM 8.1 (H) 4.8 - 5.6 % Final    Comment:             Prediabetes: 5.7 - 6.4          Diabetes: >6.4          Glycemic control for adults with diabetes: <7.0    12/28/2022 10:43 AM 9.1 (H) 4.8 - 5.6 % Final    Comment:             Prediabetes: 5.7 - 6.4          Diabetes: >6.4          Glycemic control for adults with diabetes: <7.0     CBG: No results for input(s): "GLUCAP" in the last 168 hours.  Review of Systems:   Review of Systems  Constitutional:  Positive for fever and malaise/fatigue.  HENT:  Positive for congestion.   Eyes: Negative.   Respiratory:  Positive for cough, hemoptysis, sputum production and shortness of breath.   Cardiovascular: Negative.   Gastrointestinal: Negative.   Genitourinary: Negative.   Musculoskeletal: Negative.   Neurological: Negative.   Endo/Heme/Allergies: Negative.      Past Medical History:  She,  has a past medical history of Abnormal pulmonary function test, Anemia, Anxiety, Arthritis, Barrett's esophagus, Chest pain,  Chronic anticoagulation, Chronic combined systolic and diastolic CHF (congestive heart failure) (HCC), Chronic LBP, Diabetes mellitus, type 2 (HCC), Gastroparesis, GERD (gastroesophageal reflux disease), Heart attack (HCC) (08/13/2020), Hiatal hernia, Hyperlipidemia, Hypertension, Hypothyroid, IBS (irritable bowel syndrome), Obesity, OSA on CPAP, Paroxysmal atrial fibrillation (HCC), Pulmonary hypertension (HCC) (01/2015), Seizures (HCC), and Syncope.   Surgical History:   Past Surgical History:  Procedure Laterality Date   BACK SURGERY  1996   BIOPSY N/A 11/08/2013   Procedure: BIOPSY  / Tissue sampling / ulcers present in small intestine;  Surgeon: West Bali, MD;  Location: [facility];  Service: Endoscopy;  Laterality: N/A;   CARDIAC CATHETERIZATION  2002   CARDIAC CATHETERIZATION N/A 01/26/2015   Procedure: Right Heart Cath;  Surgeon: Laurey Morale, MD;  Location: [facility] INVASIVE CV LAB;  Service: Cardiovascular;  Laterality: N/A;   CARDIOVASCULAR STRESS TEST  2008   Stress nuclear study   CARDIOVERSION N/A 03/06/2015   Procedure: CARDIOVERSION;  Surgeon: Laurey Morale, MD;  Location: [facility] ENDOSCOPY;  Service: Cardiovascular;  Laterality: N/A;   CARPAL TUNNEL RELEASE  1994   CATARACT EXTRACTION W/PHACO Right 01/10/2022   Procedure: CATARACT EXTRACTION PHACO AND INTRAOCULAR LENS PLACEMENT (IOC);  Surgeon: [name], MD;  Location: [facility] ORS;  Service: Ophthalmology;  Laterality: Right;  CDE: 21.51   CATARACT EXTRACTION W/PHACO Left 01/24/2022   Procedure: CATARACT EXTRACTION PHACO AND INTRAOCULAR LENS PLACEMENT (IOC);  Surgeon: [name], MD;  Location: [facility] ORS;  Service: Ophthalmology;  Laterality: Left;  CDE: 17.13   COLONOSCOPY  11/2011   Dr. Darrick Penna: Internal hemorrhoids, mild diverticulosis. Random colon biopsies negative.   COLONOSCOPY N/A 11/08/2013   SLF: Normal mucosa in the terminal ileum/The colon IS redundant/  Moderate diverticulosis throughout the entire colon. ileum bx benign. colon bx benign   CRYOTHERAPY  01/23/2021   Procedure: CRYOTHERAPY;  Surgeon: Lorin Glass, MD;  Location: [facility] ENDOSCOPY;  Service: Pulmonary;;   CRYOTHERAPY  01/24/2021   Procedure: Cloyde Reams;  Surgeon: Lorin Glass, MD;  Location: [facility] ENDOSCOPY;  Service: Pulmonary;;   CRYOTHERAPY  01/26/2021   Procedure: Cloyde Reams;  Surgeon: Lupita Leash, MD;  Location: [facility] ENDOSCOPY;  Service: Cardiopulmonary;;   ESOPHAGOGASTRODUODENOSCOPY  2008   Barrett's without dysplasia. esphagus dilated. antral erosions, h.pylori serologies negative.   ESOPHAGOGASTRODUODENOSCOPY  11/2011   Dr. Darrick Penna: Barrett's esophagus, mild gastritis, diverticulum in the second portion of the duodenum repeat EGD 3 years. Small bowel biopsies negative. Gastric biopsy show reactive gastropathy but no H. pylori. Esophageal biopsies consistent with GERD. Next EGD 11/2014   ESOPHAGOGASTRODUODENOSCOPY N/A 11/21/2014   ZOX:WRUE non-erosive gastritis/irregular z-line   ESOPHAGOGASTRODUODENOSCOPY  03/2022   FOREIGN BODY REMOVAL  01/23/2021   Procedure: FOREIGN BODY REMOVAL;  Surgeon: Lorin Glass, MD;  Location: [facility] ENDOSCOPY;  Service: Pulmonary;;   FOREIGN BODY REMOVAL  01/24/2021   Procedure: FOREIGN BODY REMOVAL;  Surgeon: Lorin Glass, MD;  Location: [facility] ENDOSCOPY;  Service: Pulmonary;;   GIVENS CAPSULE STUDY  12/07/2011   Proximal small bowel, rare AVM. Distal small bowel, multiple ulcers noted   GIVENS CAPSULE STUDY N/A 09/27/2013   Distal small bowel ulcers extending to TI.   GIVENS CAPSULE STUDY N/A 10/10/2013   Procedure: GIVENS CAPSULE STUDY;  Surgeon: West Bali, MD;  Location: [facility];  Service: Endoscopy;  Laterality: N/A;  7:30   HEMOSTASIS CONTROL  01/24/2021   Procedure: HEMOSTASIS CONTROL;  Surgeon: Lorin Glass, MD;  Location: [facility] ENDOSCOPY;  Service: Pulmonary;;  HEMOSTASIS CONTROL  01/26/2021   Procedure: HEMOSTASIS CONTROL;  Surgeon: Lupita Leash, MD;  Location: [facility] ENDOSCOPY;  Service: Cardiopulmonary;;   IR GASTROSTOMY TUBE MOD SED  01/11/2021   IR GASTROSTOMY TUBE REMOVAL  04/13/2021   IR KYPHO THORACIC WITH BONE BIOPSY  02/09/2018   KNEE ARTHROSCOPY WITH MEDIAL MENISECTOMY Right 06/09/2016   Procedure: KNEE ARTHROSCOPY WITH MEDIAL MENISECTOMY;  Surgeon: Vickki Hearing, MD;  Location: [facility] ORS;  Service: Orthopedics;  Laterality: Right;  medial and lateral menisectomy   LAMINECTOMY  1995   L4-L5   LAPAROSCOPIC CHOLECYSTECTOMY  1990s   LEFT HEART CATHETERIZATION WITH CORONARY ANGIOGRAM  01/10/2014   Procedure: LEFT HEART CATHETERIZATION WITH CORONARY ANGIOGRAM;  Surgeon: Lesleigh Noe, MD;  Location: [facility] CATH LAB;  Service: Cardiovascular;;   MICROLARYNGOSCOPY WITH LASER AND BALLOON DILATION N/A 11/17/2021   Procedure: MICROLARYNGOSCOPY WITH CO2 LASER, SUBGLOTTIC LARYNGEAL MASS BIOSPY;  Surgeon: Serena Colonel, MD;  Location: [facility] OR;  Service: ENT;  Laterality: N/A;   RIGHT HEART CATHETERIZATION N/A 01/10/2014   Procedure: RIGHT HEART CATH;  Surgeon: Lesleigh Noe, MD;  Location: [facility] CATH LAB;  Service: Cardiovascular;   Laterality: N/A;   TOTAL ABDOMINAL HYSTERECTOMY  1999   TRACHEOSTOMY REVISION N/A 07/18/2021   Procedure: TRACHEOSTOMY REVISION;  Surgeon: Suzanna Obey, MD;  Location: [facility] OR;  Service: ENT;  Laterality: N/A;   TRACHEOSTOMY TUBE PLACEMENT N/A 09/01/2017   Procedure: TRACHEOSTOMY;  Surgeon: Drema Halon, MD;  Location: [facility] OR;  Service: ENT;  Laterality: N/A;   VIDEO BRONCHOSCOPY Bilateral 01/23/2021   Procedure: VIDEO BRONCHOSCOPY WITHOUT FLUORO;  Surgeon: Lorin Glass, MD;  Location: [facility] ENDOSCOPY;  Service: Pulmonary;  Laterality: Bilateral;   VIDEO BRONCHOSCOPY Bilateral 01/24/2021   Procedure: VIDEO BRONCHOSCOPY WITHOUT FLUORO;  Surgeon: Lorin Glass, MD;  Location: [facility] ENDOSCOPY;  Service: Pulmonary;  Laterality: Bilateral;   VIDEO BRONCHOSCOPY N/A 01/26/2021   Procedure: VIDEO BRONCHOSCOPY WITHOUT FLUORO;  Surgeon: Lupita Leash, MD;  Location: [facility] ENDOSCOPY;  Service: Cardiopulmonary;  Laterality: N/A;     Social History:   reports that she quit smoking about 30 years ago. Her smoking use included cigarettes. She started smoking about 57 years ago. She has a 6.8 pack-year smoking history. She has never used smokeless tobacco. She reports that she does not currently use alcohol. She reports that she does not use drugs.   Family History:  Her family history includes Alzheimer's disease in her mother; Breast cancer in her sister; Heart attack in her mother; Hypertension in her mother and another family member; Stroke in her mother. There is no history of Heart disease, Colon cancer, or Liver disease.   Allergies Allergies  Allergen Reactions   Codeine Nausea And Vomiting and Other (See Comments)    Hallucinations   Dilaudid [Hydromorphone Hcl] Other (See Comments)    Syncope    Reglan [Metoclopramide] Other (See Comments)    Dyskinesia    Levsin [Hyoscyamine] Diarrhea    Caused worse diarrhea     Home Medications  Prior to Admission medications   Medication Sig  Start Date End Date Taking? Authorizing Provider  acetaminophen (TYLENOL) 650 MG CR tablet Take 1,300 mg by mouth 2 (two) times daily.    [provider]  atorvastatin (LIPITOR) 40 MG tablet Take 1 tablet (40 mg total) by mouth daily. 03/29/23   Del Nigel Berthold, FNP  busPIRone (BUSPAR) 30 MG tablet Take 1 tablet (30 mg total) by mouth in the morning and  at bedtime. 06/05/23   Del Nigel Berthold, FNP  cefdinir (OMNICEF) 300 MG capsule Take 1 capsule (300 mg total) by mouth every 12 (twelve) hours. 05/03/23   Catarina Hartshorn, MD  Cholecalciferol (VITAMIN D-3 PO) Take 1 tablet by mouth daily in the afternoon.    [provider]  Continuous Glucose Sensor (FREESTYLE LIBRE 2 SENSOR) MISC FOR CONTINUOUS GLUCOSE MONITORING. 03/03/23   Billie Lade, MD  Continuous Glucose Sensor (FREESTYLE LIBRE 2 SENSOR) MISC Use daily 03/02/23   Del Newman Nip, Tenna Child, FNP  Continuous Glucose Sensor (FREESTYLE LIBRE 2 SENSOR) MISC APPLY ONE SENSOR TO THE SKIN EVERY 14 DAYS AS DIRECTED TO CONTINUOUSLY MONITOR GLUCOSE LEVELS 06/13/23   Del Newman Nip, Tenna Child, FNP  dicyclomine (BENTYL) 10 MG capsule TAKE 1 CAPSULE BY MOUTH UP TO 4 TIMES DAILY BEFORE MEALS AND AT BEDTIME (STOP IF CONSTIPATION). 07/27/23   Letta Median, PA-C  donepezil (ARICEPT) 5 MG tablet TAKE 1 TABLET BY MOUTH AT BEDTIME FOR dementia 02/21/23   Del Orbe Polanco, Tenna Child, FNP  ELIQUIS 5 MG TABS tablet TAKE 1 TABLET BY MOUTH TWICE DAILY AS A BLOOD THINNER 02/21/23   Del Newman Nip, Tenna Child, FNP  furosemide (LASIX) 20 MG tablet Take 1 tablet (20 mg total) by mouth daily. 05/30/23 08/28/23  Strader, Lennart Pall, PA-C  gabapentin (NEURONTIN) 100 MG capsule TAKE ONE CAPSULE BY MOUTH THREE TIMES DAILY FOR NEUROPATHIC PAIN 02/21/23   Del Newman Nip, Tenna Child, FNP  glycopyrrolate (ROBINUL) 1 MG tablet Take 1 tablet (1 mg total) by mouth 2 (two) times daily. 05/03/23   Catarina Hartshorn, MD  insulin aspart (NOVOLOG FLEXPEN) 100 UNIT/ML FlexPen  Inject 0-8 Units into the skin 3 (three) times daily with meals. 06/09/23   Del Nigel Berthold, FNP  insulin glargine (LANTUS SOLOSTAR) 100 UNIT/ML Solostar Pen Inject 20 Units into the skin at bedtime. 06/09/23   Del Nigel Berthold, FNP  ipratropium-albuterol (DUONEB) 0.5-2.5 (3) MG/3ML SOLN Take 3 mLs by nebulization every 4 (four) hours as needed (Asthma). 11/08/21   [provider]  JARDIANCE 10 MG TABS tablet TAKE 1 TABLET BY MOUTH DAILY. 05/12/23   Del Newman Nip, Tenna Child, FNP  levETIRAcetam (KEPPRA) 500 MG tablet TAKE 1 TABLET BY MOUTH TWICE DAILY FOR SEIZURE PREVENTION 03/02/23   Del Nigel Berthold, FNP  levothyroxine (SYNTHROID) 150 MCG tablet Take 1 tablet (150 mcg total) by mouth daily before breakfast. 05/03/23   Tat, Onalee Hua, MD  lidocaine (XYLOCAINE) 2 % jelly Apply 1 Application topically as needed for up to 1 day (around trach stoma and use for trach lube). 05/25/23 05/30/23  Simonne Martinet, NP  loperamide (IMODIUM) 2 MG capsule Take 2 mg by mouth every 8 (eight) hours as needed for diarrhea or loose stools. 07/09/21   [provider]  LORazepam (ATIVAN) 1 MG tablet Take 1-1.5 tablets once daily as needed for anxiety or to aid with sleep 06/30/23   Del Nigel Berthold, FNP  metoprolol tartrate (LOPRESSOR) 25 MG tablet TAKE 1 TABLET BY MOUTH TWICE DAILY FOR HIGH BLOOD PRESSURE OR HEART RATE CONTROL 03/02/23   Del Nigel Berthold, FNP  mupirocin ointment (BACTROBAN) 2 % Apply 1 Application topically 2 (two) times daily. 03/29/23   Del Nigel Berthold, FNP  nitroGLYCERIN (NITROSTAT) 0.4 MG SL tablet Place 0.4 mg under the tongue every 5 (five) minutes as needed for chest pain. 03/08/22   [provider]  pantoprazole (PROTONIX) 40 MG tablet TAKE 1 TABLET BY  MOUTH TWICE DAILY FOR acid reflux 03/02/23   Del Newman Nip, Tenna Child, FNP  potassium chloride (KLOR-CON M) 10 MEQ tablet TAKE 2 TABLETS BY MOUTH DAILY TO REPLACE POTASSIUM. 06/29/23   Del  Nigel Berthold, FNP  sertraline (ZOLOFT) 50 MG tablet Take 1 tablet (50 mg total) by mouth daily. 06/05/23   Del Nigel Berthold, FNP  traMADol (ULTRAM) 50 MG tablet Take 1 tablet (50 mg total) by mouth 3 (three) times daily as needed for moderate pain (pain score 4-6) or severe pain (pain score 7-10). 07/07/23   Del Nigel Berthold, FNP  trolamine salicylate (ASPERCREME) 10 % cream Apply 1 application. topically as needed (knee pain).    [provider]     My time 46 min

## 2023-08-05 NOTE — ED Provider Notes (Signed)
[facility] AT [facility] Provider Note   CSN: [id number] Arrival date & time: 08/05/23  1326     History  Chief Complaint  Patient presents with   tracheostomy Problem    [name] is a 73 y.o. female with past medical history significant for paroxysmal A-fib, pulmonary hypertension, heart failure, obstructive sleep apnea on CPAP, chronic tracheostomy, diabetes, history of seizures, fibromyalgia, dementia, malnutrition who presents with concern for blood in sputum when suctioning tracheostomy.  Surgery 9 days ago to dilate trachea.  Resumed Eliquis the day after, no blood after surgery until yesterday.  Several blood clots suctioned since yesterday.  Increased fatigue for the last 2 days, increased oxygen use 2 to 3 L from 2 L at baseline.  Febrile in triage, positive flu exposure at home.  HPI     Home Medications Prior to Admission medications   Medication Sig Start Date End Date Taking? Authorizing Provider  acetaminophen (TYLENOL) 650 MG CR tablet Take 1,300 mg by mouth 2 (two) times daily.    [provider]  atorvastatin (LIPITOR) 40 MG tablet Take 1 tablet (40 mg total) by mouth daily. 03/29/23   Del Nigel Berthold, FNP  busPIRone (BUSPAR) 30 MG tablet Take 1 tablet (30 mg total) by mouth in the morning and at bedtime. 06/05/23   Del Nigel Berthold, FNP  cefdinir (OMNICEF) 300 MG capsule Take 1 capsule (300 mg total) by mouth every 12 (twelve) hours. 05/03/23   Catarina Hartshorn, MD  Cholecalciferol (VITAMIN D-3 PO) Take 1 tablet by mouth daily in the afternoon.    [provider]  Continuous Glucose Sensor (FREESTYLE LIBRE 2 SENSOR) MISC FOR CONTINUOUS GLUCOSE MONITORING. 03/03/23   Billie Lade, MD  Continuous Glucose Sensor (FREESTYLE LIBRE 2 SENSOR) MISC Use daily 03/02/23   Del Newman Nip, Tenna Child, FNP  Continuous Glucose Sensor (FREESTYLE LIBRE 2 SENSOR) MISC APPLY ONE SENSOR TO THE SKIN EVERY 14 DAYS AS DIRECTED  TO CONTINUOUSLY MONITOR GLUCOSE LEVELS 06/13/23   Del Newman Nip, Tenna Child, FNP  dicyclomine (BENTYL) 10 MG capsule TAKE 1 CAPSULE BY MOUTH UP TO 4 TIMES DAILY BEFORE MEALS AND AT BEDTIME (STOP IF CONSTIPATION). 07/27/23   Letta Median, PA-C  donepezil (ARICEPT) 5 MG tablet TAKE 1 TABLET BY MOUTH AT BEDTIME FOR dementia 02/21/23   Del Orbe Polanco, Tenna Child, FNP  ELIQUIS 5 MG TABS tablet TAKE 1 TABLET BY MOUTH TWICE DAILY AS A BLOOD THINNER 02/21/23   Del Newman Nip, Tenna Child, FNP  furosemide (LASIX) 20 MG tablet Take 1 tablet (20 mg total) by mouth daily. 05/30/23 08/28/23  Strader, Lennart Pall, PA-C  gabapentin (NEURONTIN) 100 MG capsule TAKE ONE CAPSULE BY MOUTH THREE TIMES DAILY FOR NEUROPATHIC PAIN 02/21/23   Del Newman Nip, Tenna Child, FNP  glycopyrrolate (ROBINUL) 1 MG tablet Take 1 tablet (1 mg total) by mouth 2 (two) times daily. 05/03/23   Catarina Hartshorn, MD  insulin aspart (NOVOLOG FLEXPEN) 100 UNIT/ML FlexPen Inject 0-8 Units into the skin 3 (three) times daily with meals. 06/09/23   Del Nigel Berthold, FNP  insulin glargine (LANTUS SOLOSTAR) 100 UNIT/ML Solostar Pen Inject 20 Units into the skin at bedtime. 06/09/23   Del Nigel Berthold, FNP  ipratropium-albuterol (DUONEB) 0.5-2.5 (3) MG/3ML SOLN Take 3 mLs by nebulization every 4 (four) hours as needed (Asthma). 11/08/21   [provider]  JARDIANCE 10 MG TABS tablet TAKE 1 TABLET BY MOUTH DAILY. 05/12/23   Del Newman Nip,  Tenna Child, FNP  levETIRAcetam (KEPPRA) 500 MG tablet TAKE 1 TABLET BY MOUTH TWICE DAILY FOR SEIZURE PREVENTION 03/02/23   Del Nigel Berthold, FNP  levothyroxine (SYNTHROID) 150 MCG tablet Take 1 tablet (150 mcg total) by mouth daily before breakfast. 05/03/23   Tat, Onalee Hua, MD  lidocaine (XYLOCAINE) 2 % jelly Apply 1 Application topically as needed for up to 1 day (around trach stoma and use for trach lube). 05/25/23 05/30/23  Simonne Martinet, NP  loperamide (IMODIUM) 2 MG capsule Take 2 mg by mouth every 8  (eight) hours as needed for diarrhea or loose stools. 07/09/21   [provider]  LORazepam (ATIVAN) 1 MG tablet Take 1-1.5 tablets once daily as needed for anxiety or to aid with sleep 06/30/23   Del Nigel Berthold, FNP  metoprolol tartrate (LOPRESSOR) 25 MG tablet TAKE 1 TABLET BY MOUTH TWICE DAILY FOR HIGH BLOOD PRESSURE OR HEART RATE CONTROL 03/02/23   Del Nigel Berthold, FNP  mupirocin ointment (BACTROBAN) 2 % Apply 1 Application topically 2 (two) times daily. 03/29/23   Del Nigel Berthold, FNP  nitroGLYCERIN (NITROSTAT) 0.4 MG SL tablet Place 0.4 mg under the tongue every 5 (five) minutes as needed for chest pain. 03/08/22   [provider]  pantoprazole (PROTONIX) 40 MG tablet TAKE 1 TABLET BY MOUTH TWICE DAILY FOR acid reflux 03/02/23   Del Newman Nip, Tenna Child, FNP  potassium chloride (KLOR-CON M) 10 MEQ tablet TAKE 2 TABLETS BY MOUTH DAILY TO REPLACE POTASSIUM. 06/29/23   Del Nigel Berthold, FNP  sertraline (ZOLOFT) 50 MG tablet Take 1 tablet (50 mg total) by mouth daily. 06/05/23   Del Nigel Berthold, FNP  traMADol (ULTRAM) 50 MG tablet Take 1 tablet (50 mg total) by mouth 3 (three) times daily as needed for moderate pain (pain score 4-6) or severe pain (pain score 7-10). 07/07/23   Del Nigel Berthold, FNP  trolamine salicylate (ASPERCREME) 10 % cream Apply 1 application. topically as needed (knee pain).    [provider]      Allergies    Codeine, Dilaudid [hydromorphone hcl], Reglan [metoclopramide], and Levsin [hyoscyamine]    Review of Systems   Review of Systems  All other systems reviewed and are negative.   Physical Exam Updated Vital Signs BP 98/79   Pulse 78   Temp (!) 100.5 F (38.1 C) (Oral)   Resp 18   SpO2 92%  Physical Exam Vitals and nursing note reviewed.  Constitutional:      General: She is not in acute distress.    Appearance: Normal appearance. She is ill-appearing.     Comments: Chronically  ill-appearing, and appears uncomfortable  HENT:     Head: Normocephalic and atraumatic.  Eyes:     General:        Right eye: No discharge.        Left eye: No discharge.  Cardiovascular:     Rate and Rhythm: Normal rate. Rhythm irregular.     Heart sounds: No murmur heard.    No friction rub. No gallop.  Pulmonary:     Effort: Pulmonary effort is normal.     Breath sounds: Normal breath sounds.     Comments: Coarse rhonchi, increased work of breathing throughout Abdominal:     General: Bowel sounds are normal.     Palpations: Abdomen is soft.  Skin:    General: Skin is warm and dry.     Capillary Refill: Capillary refill takes less  than 2 seconds.  Neurological:     Mental Status: She is alert and oriented to person, place, and time.  Psychiatric:        Mood and Affect: Mood normal.        Behavior: Behavior normal.     ED Results / Procedures / Treatments   Labs (all labs ordered are listed, but only abnormal results are displayed) Labs Reviewed  COMPREHENSIVE METABOLIC PANEL - Abnormal; Notable for the following components:      Result Value   CO2 17 (*)    Glucose, Bld 120 (*)    Creatinine, Ser 1.25 (*)    Calcium 8.8 (*)    Albumin 3.3 (*)    GFR, Estimated 45 (*)    All other components within normal limits  CBC WITH DIFFERENTIAL/PLATELET - Abnormal; Notable for the following components:   WBC 15.6 (*)    RBC 3.40 (*)    Hemoglobin 10.3 (*)    HCT 34.3 (*)    MCV 100.9 (*)    RDW 15.7 (*)    Platelets 137 (*)    Neutro Abs 12.3 (*)    Monocytes Absolute 1.2 (*)    Abs Immature Granulocytes 0.08 (*)    All other components within normal limits  TROPONIN I (HIGH SENSITIVITY) - Abnormal; Notable for the following components:   Troponin I (High Sensitivity) 54 (*)    All other components within normal limits  RESP PANEL BY RT-PCR (RSV, FLU A&B, COVID)  [id number]  LACTIC ACID, PLASMA  LACTIC ACID, PLASMA  TROPONIN I (HIGH SENSITIVITY)    EKG EKG  Interpretation Date/Time:  Saturday August 05 2023 15:08:37 EST Ventricular Rate:  76 PR Interval:    QRS Duration:  96 QT Interval:  398 QTC Calculation: 447 R Axis:   -28  Text Interpretation: Atrial fibrillation Nonspecific T wave abnormality Abnormal ECG When compared with ECG of 15-Feb-2021 07:19, PREVIOUS ECG IS PRESENT Confirmed by Alona Bene [phone]) on 08/05/2023 3:16:00 PM  Radiology DG Chest 2 View Result Date: 08/05/2023 CLINICAL DATA:  Hemoptysis EXAM: CHEST - 2 VIEW COMPARISON:  04/29/2023 FINDINGS: Tracheostomy tube remains in place. Left chest wall loop recorder. Stable cardiomegaly. Aortic atherosclerosis. Similar prominence of the perihilar and bibasilar interstitial markings. No new airspace consolidation. No pleural effusion or pneumothorax. IMPRESSION: Similar prominence of the perihilar and bibasilar interstitial markings, which may reflect bronchitic lung changes, edema, or potentially developing atypical/viral infection. Electronically Signed   By: Duanne Guess D.O.   On: 08/05/2023 15:54    Procedures Procedures    Medications Ordered in ED Medications  cefTRIAXone (ROCEPHIN) 1 g in sodium chloride 0.9 % 100 mL IVPB (1 g Intravenous New Bag/Given 08/05/23 1607)  azithromycin (ZITHROMAX) 500 mg in sodium chloride 0.9 % 250 mL IVPB (has no administration in time range)  acetaminophen (TYLENOL) tablet 650 mg (650 mg Oral Given 08/05/23 1605)    ED Course/ Medical Decision Making/ A&P                                 Medical Decision Making Risk OTC drugs.   This patient is a 73 y.o. female who presents to the ED for concern of shortness of breath, weakness, fever, bloody secretion from tracheostomy, this involves an extensive number of treatment options, and is a complaint that carries with it a high risk of complications and morbidity. The emergent differential diagnosis prior to evaluation  includes, but is not limited to,  CVA, spinal cord injury, ACS,  arrhythmia, syncope, orthostatic hypotension, sepsis, hypoglycemia, hypoxia, electrolyte disturbance, endocrine disorder, anemia, environmental exposure, polypharmacy, in context of her known history I suspect pneumonia versus upper respiratory infection, complication related to recent surgery. This is not an exhaustive differential.   Past Medical History / Co-morbidities / Social History: paroxysmal A-fib, pulmonary hypertension, heart failure, obstructive sleep apnea on CPAP, chronic tracheostomy, diabetes, history of seizures, fibromyalgia, dementia, malnutrition  Additional history: Chart reviewed. Pertinent results include: Reviewed recent surgery history  Physical Exam: Physical exam performed. The pertinent findings include: Chronically ill-appearing, appears uncomfortable, febrile on hospital arrival, temperature 100.5.  Irregular heart rhythm which is her baseline, no tachycardia.  Coarse rhonchi throughout, increase oxygen needs, at 2 L at baseline, requiring 3 L to maintain stable oxygen saturation in the emergency department.  Endorses increased shortness of breath.  Lab Tests: I ordered, and personally interpreted labs.  The pertinent results include: CBC notable for leukocytosis, white blood cells 15.6, anemia, hemoglobin 10.3, somewhat decreased from recent, suspect probable surgical losses along with some ongoing bleeding, low clinical suspicion for acute GI bleed.  COVID flu, RSV are all negative increasing suspicion for other viral or atypical bacterial pneumonia.  CMP notable for bicarb deficit, CO2 17 likely secondary to increased respiratory effort, creatinine elevated at 1.25, slightly worse than baseline but not meeting criteria for AKI.   Imaging Studies: I ordered imaging studies including plain film chest x-ray. I independently visualized and interpreted imaging which showed increased perihilar markings, suspect viral or atypical bacterial infiltrate. I agree with the  radiologist interpretation.   Cardiac Monitoring:  The patient was maintained on a cardiac monitor.  My attending physician Dr. Anitra Lauth viewed and interpreted the cardiac monitored which showed an underlying rhythm of: Afib, normal rate. I agree with this interpretation.   Medications: I ordered medication including azithromycin, Rocephin then for suspected pneumonia, Tylenol for fever  Consultations Obtained: I requested consultation with the critical care specialist, Zenia Resides, NP,  and discussed lab and imaging findings as well as pertinent plan - they recommend: Admission to critical care as patient is vent dependent at night, probable viral infection versus pneumonia in setting of recent surgery, bloody secretions from tracheostomy   Disposition: After consideration of the diagnostic results and the patients response to treatment, I feel that patient would benefit from admission for overnight monitoring, antibiotics, and additional workup as discussed above.   I discussed this case with my attending physician Dr. Anitra Lauth who cosigned this note including patient's presenting symptoms, physical exam, and planned diagnostics and interventions. Attending physician stated agreement with plan or made changes to plan which were implemented.    Final Clinical Impression(s) / ED Diagnoses Final diagnoses:  Community acquired pneumonia, unspecified laterality    Rx / DC Orders ED Discharge Orders     None         [place] 08/05/23 1623    Gwyneth Sprout, MD 08/07/23 0005

## 2023-08-05 NOTE — ED Notes (Signed)
 Patient transported to X-ray

## 2023-08-05 NOTE — ED Notes (Signed)
 Admitting at bedside

## 2023-08-05 NOTE — ED Triage Notes (Signed)
Pt BIB daughter with complaints of blood in sputum when suctioning trach. Pt had surgery 9 days ago to dialate the trachea. Daughter states there was not any blood after surgery until yesterday. There have been several blood clots suctioned since yesterday. Trach doctor was contacted and advised to hold eloquis today. Pt has been more tired past 2 days. Daughter suctioned patient during triage and no blood in mucus at that time.

## 2023-08-05 NOTE — Progress Notes (Signed)
Called by daughter [name]  Pt well known to me. I follow her for trach management. Has been doing well from trach stand-point. Did have recent hospital stay for nausea, vomiting and prob aspiration in setting of gastroparesis and reflux but made full recovery.   Has just recently underwent microlaryngoscopy. Removal of granulation tissue and dilation of trachea w/ kenalog injection on 1/23. This was in hopes of helping her tolerate PMV better, she is not a candidate for dacannulation as does not tolerate NIPPV and will still require home vent at night. Had been doing well up to 1/31. Actually speaking w/ PMV in place and tolerating well.  On 1/31 husband noted bloody secretions from trach during suction which was new. Later that evening [name] called me reporting that she coughed out a large bloody clot (picture was sent) appeared like old blood and just less than dime size in diameter. I instructed her to hold her eliquis for 2/1, but should the hemoptysis not subside or should Samone develop increased WOB or CP she should present to ER.   She notified me this am that Ms Korman had not improved. I advised her to go to ER for evaluation

## 2023-08-05 NOTE — ED Provider Triage Note (Signed)
Emergency Medicine Provider Triage Evaluation Note  [name] , a 73 y.o. female  was evaluated in triage.  Pt complains of trach problem. State same began yesterday. She is having bloody secretions from her trach. Had a procedure on 1/23, no bleeding after.  Restarted Eliquis a day later.  Patient's daughter called her doctor she was told to hold the patients elliquis. Patient on 2L O2 at baseline, increased to 3 L due to sats in the 80s with improvement.  Patient also notably febrile, does have flu exposure in the household.  Review of Systems  Positive:  Negative:   Physical Exam  BP 100/83   Pulse 78   Temp (!) 100.5 F (38.1 C) (Oral)   Resp 20   SpO2 95%  Gen:   Awake, no distress   Resp:  Normal effort  MSK:   Moves extremities without difficulty  Other:    Medical Decision Making  Medically screening exam initiated at 2:53 PM.  Appropriate orders placed.  DESTENI PISCOPO was informed that the remainder of the evaluation will be completed by another provider, this initial triage assessment does not replace that evaluation, and the importance of remaining in the ED until their evaluation is complete.     Tara Bandy, PA-C 08/05/23 1505

## 2023-08-06 ENCOUNTER — Inpatient Hospital Stay (HOSPITAL_COMMUNITY): Payer: PPO

## 2023-08-06 DIAGNOSIS — Z930 Tracheostomy status: Secondary | ICD-10-CM

## 2023-08-06 DIAGNOSIS — I4811 Longstanding persistent atrial fibrillation: Secondary | ICD-10-CM | POA: Diagnosis not present

## 2023-08-06 DIAGNOSIS — J189 Pneumonia, unspecified organism: Secondary | ICD-10-CM | POA: Diagnosis not present

## 2023-08-06 LAB — GLUCOSE, CAPILLARY
Glucose-Capillary: 131 mg/dL — ABNORMAL HIGH (ref 70–99)
Glucose-Capillary: 114 mg/dL — ABNORMAL HIGH (ref 70–99)
Glucose-Capillary: 109 mg/dL — ABNORMAL HIGH (ref 70–99)
Glucose-Capillary: 202 mg/dL — ABNORMAL HIGH (ref 70–99)

## 2023-08-06 LAB — MAGNESIUM: Magnesium: 1.9 mg/dL (ref 1.7–2.4)

## 2023-08-06 LAB — CBC
HCT: 31.3 % — ABNORMAL LOW (ref 36.0–46.0)
Hemoglobin: 9.8 g/dL — ABNORMAL LOW (ref 12.0–15.0)
MCH: 31.1 pg (ref 26.0–34.0)
MCHC: 31.3 g/dL (ref 30.0–36.0)
MCV: 99.4 fL (ref 80.0–100.0)
Platelets: 110 10*3/uL — ABNORMAL LOW (ref 150–400)
RBC: 3.15 MIL/uL — ABNORMAL LOW (ref 3.87–5.11)
RDW: 15.6 % — ABNORMAL HIGH (ref 11.5–15.5)
WBC: 12.2 10*3/uL — ABNORMAL HIGH (ref 4.0–10.5)
nRBC: 0 % (ref 0.0–0.2)

## 2023-08-06 LAB — BASIC METABOLIC PANEL
Anion gap: 8 (ref 5–15)
BUN: 21 mg/dL (ref 8–23)
CO2: 18 mmol/L — ABNORMAL LOW (ref 22–32)
Calcium: 8.6 mg/dL — ABNORMAL LOW (ref 8.9–10.3)
Chloride: 113 mmol/L — ABNORMAL HIGH (ref 98–111)
Creatinine, Ser: 1.14 mg/dL — ABNORMAL HIGH (ref 0.44–1.00)
GFR, Estimated: 51 mL/min — ABNORMAL LOW (ref >60)
Glucose, Bld: 87 mg/dL (ref 70–99)
Potassium: 4.2 mmol/L (ref 3.5–5.1)
Sodium: 139 mmol/L (ref 135–145)

## 2023-08-06 LAB — PROCALCITONIN: Procalcitonin: 2.33 ng/mL

## 2023-08-06 MED ORDER — ONDANSETRON HCL 4 MG/2ML IJ SOLN
4.0000 mg | Freq: Four times a day (QID) | INTRAMUSCULAR | Status: DC | PRN
  Administered 2023-08-06: 4 mg via INTRAVENOUS
  Filled 2023-08-06: qty 2

## 2023-08-06 MED ORDER — MAGNESIUM SULFATE 2 GM/50ML IV SOLN
2.0000 g | Freq: Once | INTRAVENOUS | Status: AC
  Administered 2023-08-06: 2 g via INTRAVENOUS
  Filled 2023-08-06: qty 50

## 2023-08-06 NOTE — Progress Notes (Signed)
NAME:  [name], MRN:  [id number], DOB:  Jul 13, 1950, LOS: 1 ADMISSION DATE:  08/05/2023, CONSULTATION DATE:  2/1 REFERRING MD:  [email or web address], CHIEF COMPLAINT:  PNA and hemoptysis    History of Present Illness:  73 year old female who is trach dep in setting of OSA/OHS and intolerance of NIPPV. Also has sig h/o subglottic stenosis which has made her phonic and unable to tolerate PMV. She is followed at [facility] for this. Just recently underwent microlaryngoscopy, removal of granulation tissue and tracheal dilation by DR Delford Field on 1/23. This was tolerated well. She was discharged home that day and she had been doing well, actually able to speak and tolerating increased time using PMV at home. On 1/31 had some bloody secretions from trach during suction. Later that evening coughed up old dark blood clot about dime sized. 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 (HCC), Obesity hypoventilation syndrome (HCC) WHO group 3 pulmonary arterial hypertension (HCC),Gastroparesis, HFpEF, RV dysfxn, chronic afib CHADSvasc score 5 on DOAC, iron def anemia  Significant Hospital Events: Including procedures, antibiotic start and stop dates in addition to other pertinent events   2/1 admitted w/ hemoptysis and PNA sputum sent started on CAP coverage  2/2 improving hemoptysis   Interim History / Subjective:   NAEO   Objective   Blood pressure 99/64, pulse 80, temperature 99.5 F (37.5 C), temperature source Oral, resp. rate 20, weight 76.5 kg, SpO2 95%.    Vent Mode: AC FiO2 (%):  [40 %] 40 %   Intake/Output Summary (Last 24 hours) at 08/06/2023 1136 Last data filed at 08/06/2023 0900 Gross per 24 hour  Intake 1273.46 ml  Output  400 ml  Net 873.46 ml   Filed Weights   08/06/23 0500  Weight: 76.5 kg    Examination: General: Very pleasant elderly F w trach NAD  HENT: NCAT Trach secure  Lungs: Scattered rhonchi, even and unlabored on ATC  Cardiovascular: irreg rhythm. S1s2  Abdomen: soft ndnt  Extremities: no acute joint deformity  Neuro: awake alert following commands, trying to talk  GU: defer  Resolved Hospital Problem list     Assessment & Plan:   Sepsis 2/2 CAP Hemoptysis  Chronic hypoxic/hypercarbic resp failure Trach/ nocturnal vent dependent Severe OSA/OHS WHO group 3 pHTN Hx tracheal stenosis, s/p dilation and removal of granulation tissue   -suspect hemoptysis 2/2 PNA. Did have recent granulation tissue removal however so consider site bleeding/aspiration P -routine trach care, home vent settings at night -VAP, pulm hygiene -likely restart DOAC tomorrow (2/3) if bleeding is improved -cont azithro rocephin  -if worse hemoptysis, txa via facemask (not via trach)   CKD 3a Metabolic acidosis  P -follow renal indices uop  Chronic Afib on chronic AC  Chronic HFpEF  -chadsvasc 5 P -holding AC w hemoptysis, hopefully resume 2/3  -metop  -jiardiance -optimize lytes   Hx Gastroparesis Plan -BID PPI -bentyl  IDDM  P -SSI   Hypothyroidism  Plan -synthroid   Hx dementia  Hx sz disorder  -her memory is actually very good and really only exacerbated when has acute infection or is hypercarbic Plan -delirium precautions  -cont home aricept Zoloft buspar and Neurontin  -Keppra   Physical deconditioning P -PT/OT  Best Practice (right click and "Reselect all SmartList Selections" daily)   Diet/type: Regular consistency (see orders) DVT prophylaxis prophylactic heparin  Pressure ulcer(s): N/A GI prophylaxis: PPI Lines: N/A Foley:  N/A Code Status:  full code Last date of multidisciplinary goals of care discussion [pending]  Labs   CBC: Recent Labs  Lab  08/05/23 1453 08/05/23 1649 08/06/23 0222  WBC 15.6* 17.5* 12.2*  NEUTROABS 12.3*  --   --   HGB 10.3* 10.7* 9.8*  HCT 34.3* 35.1* 31.3*  MCV 100.9* 100.3* 99.4  PLT 137* 141* 110*    Basic Metabolic Panel: Recent Labs  Lab 08/05/23 1453 08/05/23 1649 08/06/23 0222  NA 138  --  139  K 4.2  --  4.2  CL 111  --  113*  CO2 17*  --  18*  GLUCOSE 120*  --  87  BUN 23  --  21  CREATININE 1.25* 1.16* 1.14*  CALCIUM 8.8*  --  8.6*  MG  --  2.0 1.9  PHOS  --  3.2  --    GFR: Estimated Creatinine Clearance: 42.4 mL/min (A) (by C-G formula based on SCr of 1.14 mg/dL (H)). Recent Labs  Lab 08/05/23 1453 08/05/23 1454 08/05/23 1601 08/05/23 1649 08/06/23 0222  PROCALCITON  --   --   --  2.40 2.33  WBC 15.6*  --   --  17.5* 12.2*  LATICACIDVEN  --  0.9 1.5  --   --     Liver Function Tests: Recent Labs  Lab 08/05/23 1453  AST 18  ALT 16  ALKPHOS 104  BILITOT 1.2  PROT 6.8  ALBUMIN 3.3*   No results for input(s): "LIPASE", "AMYLASE" in the last 168 hours. No results for input(s): "AMMONIA" in the last 168 hours.  ABG    Component Value Date/Time   PHART 7.554 (H) 01/03/2021 2326   PCO2ART 38.1 01/03/2021 2326   PO2ART 270 (H) 01/03/2021 2326   HCO3 33.6 (H) 01/03/2021 2326   TCO2 35 (H) 01/03/2021 2326   ACIDBASEDEF 1.0 01/26/2015 1132   O2SAT 100.0 01/03/2021 2326     Coagulation Profile: Recent Labs  Lab 08/05/23 1649  INR 1.7*    Cardiac Enzymes: No results for input(s): "CKTOTAL", "CKMB", "CKMBINDEX", "TROPONINI" in the last 168 hours.  HbA1C: Hgb A1c MFr Bld  Date/Time Value Ref Range Status  03/30/2023 11:17 AM 8.1 (H) 4.8 - 5.6 % Final    Comment:             Prediabetes: 5.7 - 6.4          Diabetes: >6.4          Glycemic control for adults with diabetes: <7.0   12/28/2022 10:43 AM 9.1 (H) 4.8 - 5.6 % Final    Comment:             Prediabetes: 5.7 - 6.4          Diabetes: >6.4          Glycemic control for adults with diabetes:  <7.0     CBG: Recent Labs  Lab 08/05/23 1658 08/05/23 1741 08/05/23 2108 08/06/23 0842 08/06/23 1133  GLUCAP 126* 115* 123* 131* 114*     CCT n/a    Mod MDM   Tessie Fass MSN, AGACNP-BC [facility] Amion for pager  08/06/2023, 1:50 PM

## 2023-08-07 ENCOUNTER — Ambulatory Visit: Payer: PPO | Admitting: "Endocrinology

## 2023-08-07 DIAGNOSIS — J189 Pneumonia, unspecified organism: Secondary | ICD-10-CM | POA: Diagnosis not present

## 2023-08-07 DIAGNOSIS — E119 Type 2 diabetes mellitus without complications: Secondary | ICD-10-CM

## 2023-08-07 DIAGNOSIS — I4811 Longstanding persistent atrial fibrillation: Secondary | ICD-10-CM | POA: Diagnosis not present

## 2023-08-07 DIAGNOSIS — J9622 Acute and chronic respiratory failure with hypercapnia: Secondary | ICD-10-CM

## 2023-08-07 DIAGNOSIS — J9621 Acute and chronic respiratory failure with hypoxia: Secondary | ICD-10-CM

## 2023-08-07 DIAGNOSIS — I4891 Unspecified atrial fibrillation: Secondary | ICD-10-CM

## 2023-08-07 DIAGNOSIS — A419 Sepsis, unspecified organism: Secondary | ICD-10-CM | POA: Diagnosis not present

## 2023-08-07 LAB — GLUCOSE, CAPILLARY
Glucose-Capillary: 151 mg/dL — ABNORMAL HIGH (ref 70–99)
Glucose-Capillary: 171 mg/dL — ABNORMAL HIGH (ref 70–99)
Glucose-Capillary: 155 mg/dL — ABNORMAL HIGH (ref 70–99)
Glucose-Capillary: 184 mg/dL — ABNORMAL HIGH (ref 70–99)
Glucose-Capillary: 184 mg/dL — ABNORMAL HIGH (ref 70–99)

## 2023-08-07 LAB — BASIC METABOLIC PANEL
Anion gap: 10 (ref 5–15)
BUN: 21 mg/dL (ref 8–23)
CO2: 17 mmol/L — ABNORMAL LOW (ref 22–32)
Calcium: 8.6 mg/dL — ABNORMAL LOW (ref 8.9–10.3)
Chloride: 110 mmol/L (ref 98–111)
Creatinine, Ser: 1.1 mg/dL — ABNORMAL HIGH (ref 0.44–1.00)
GFR, Estimated: 53 mL/min — ABNORMAL LOW (ref >60)
Glucose, Bld: 148 mg/dL — ABNORMAL HIGH (ref 70–99)
Potassium: 4.2 mmol/L (ref 3.5–5.1)
Sodium: 137 mmol/L (ref 135–145)

## 2023-08-07 LAB — CBC
HCT: 31.9 % — ABNORMAL LOW (ref 36.0–46.0)
Hemoglobin: 9.8 g/dL — ABNORMAL LOW (ref 12.0–15.0)
MCH: 30.4 pg (ref 26.0–34.0)
MCHC: 30.7 g/dL (ref 30.0–36.0)
MCV: 99.1 fL (ref 80.0–100.0)
Platelets: 133 10*3/uL — ABNORMAL LOW (ref 150–400)
RBC: 3.22 MIL/uL — ABNORMAL LOW (ref 3.87–5.11)
RDW: 15.3 % (ref 11.5–15.5)
WBC: 11.2 10*3/uL — ABNORMAL HIGH (ref 4.0–10.5)
nRBC: 0 % (ref 0.0–0.2)

## 2023-08-07 LAB — PROCALCITONIN: Procalcitonin: 1.58 ng/mL

## 2023-08-07 LAB — MAGNESIUM: Magnesium: 2.5 mg/dL — ABNORMAL HIGH (ref 1.7–2.4)

## 2023-08-07 MED ORDER — APIXABAN 5 MG PO TABS
5.0000 mg | ORAL_TABLET | Freq: Two times a day (BID) | ORAL | Status: DC
  Administered 2023-08-07 – 2023-08-08 (×3): 5 mg via ORAL
  Filled 2023-08-07 (×3): qty 1

## 2023-08-07 MED ORDER — FUROSEMIDE 20 MG PO TABS
20.0000 mg | ORAL_TABLET | Freq: Every day | ORAL | Status: DC
  Administered 2023-08-07 – 2023-08-08 (×2): 20 mg via ORAL
  Filled 2023-08-07 (×2): qty 1

## 2023-08-07 MED ORDER — IPRATROPIUM-ALBUTEROL 0.5-2.5 (3) MG/3ML IN SOLN: 3.0000 mL | RESPIRATORY_TRACT | Status: DC | PRN

## 2023-08-07 NOTE — Evaluation (Signed)
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 She is followed at [facility] for this. Just recently underwent microlaryngoscopy, removal of granulation tissue and tracheal dilation by DR Delford Field on 1/23   Clinical Impression  Pt admitted with above. Pt trach dep at baseline. Pt lives with family and has 24/7 assist, uses RW at baseline, and can do most ADLs with minimal assist. Pt ambulation limited this date by fatigue, onset of DOE, and drop in SPO2 into 70s on 10LO2 via trach collar. Pt mobilizing at supervision level and anticipate pt will be safe to d/c home with family and home health services once medically stable. Acute PT to cont to follow.        If plan is discharge home, recommend the following: A little help with walking and/or transfers;A little help with bathing/dressing/bathroom;Assist for transportation;Help with stairs or ramp for entrance   Can travel by private vehicle        Equipment Recommendations None recommended by PT  Recommendations for Other Services       Functional Status Assessment Patient has had a recent decline in their functional status and demonstrates the ability to make significant improvements in function in a reasonable and predictable amount of time.     Precautions / Restrictions Precautions Precautions: Fall Precaution Comments: watch SPO2, trach dependent at baseline Restrictions Weight Bearing Restrictions Per Provider Order: No      Mobility  Bed Mobility Overal bed mobility: Needs Assistance Bed Mobility: Supine to Sit     Supine to sit: Supervision     General bed mobility comments: supervision for line management, HOB  elevated, pt able to bring self to EOB without physical assist    Transfers Overall transfer level: Needs assistance Equipment used: Rolling walker (2 wheels) Transfers: Sit to/from Stand Sit to Stand: Contact guard assist           General transfer comment: contact guard for line management and safety, wide base of support    Ambulation/Gait Ambulation/Gait assistance: Min assist, +2 safety/equipment Gait Distance (Feet): 100 Feet Assistive device: Rolling walker (2 wheels) Gait Pattern/deviations: Step-through pattern, Decreased stride length Gait velocity: initially quick, verbal cues to slow down Gait velocity interpretation: 1.31 - 2.62 ft/sec, indicative of limited community ambulator   General Gait Details: verbal cues to slow down, SPO2 dec to 76% on 10Lo2 via trach collar, noted DOE, no episode of LOB, 1 standing rest break  Stairs            Wheelchair Mobility     Tilt Bed    Modified Rankin (Stroke Patients Only)       Balance Overall balance assessment: Mild deficits observed, not formally tested                                           Pertinent Vitals/Pain Pain Assessment Pain Assessment: Faces Faces Pain Scale: Hurts little more Pain Location: R knee - chronic    Home Living Family/patient expects to be discharged to:: Private residence Living Arrangements: Spouse/significant other;Children Available Help at Discharge: Family;Available 24 hours/day Type of Home:  Mobile home Home Access: Stairs to enter;Ramped entrance Entrance Stairs-Rails: Right;Left;Can reach both Entrance Stairs-Number of Steps: 4   Home Layout: One level Home Equipment: Rolling Walker (2 wheels);BSC/3in1;Grab bars - tub/shower;Grab bars - toilet;Wheelchair - manual;Hand held shower head;Hospital bed (has hoyer lift) Additional Comments: on vent via trach at 4LO2 at night, 2lO2 via trach during the day    Prior Function Prior Level of Function :  Needs assist             Mobility Comments: uses RW at baseline, transport chair for long distances ADLs Comments: use shower chair, dtr manages water and assist with washing hair, daughter sets up clothes however pt performs dressing on own, feeds self     Extremity/Trunk Assessment   Upper Extremity Assessment Upper Extremity Assessment: Defer to OT evaluation    Lower Extremity Assessment Lower Extremity Assessment: Generalized weakness    Cervical / Trunk Assessment Cervical / Trunk Assessment: Normal  Communication   Communication Communication: Tracheostomy  Cognition Arousal: Alert Behavior During Therapy: WFL for tasks assessed/performed Overall Cognitive Status: Within Functional Limits for tasks assessed                                 General Comments: pt able to follow commands, eager to mobilize, provided accurate home set up        General Comments General comments (skin integrity, edema, etc.): SpO2 dec to 74% on 10Lo2 during amb, s/p 2 min sitting pts SPO2 returned to 90s    Exercises     Assessment/Plan    PT Assessment Patient needs continued PT services  PT Problem List Decreased strength;Decreased activity tolerance;Decreased balance;Decreased mobility;Cardiopulmonary status limiting activity       PT Treatment Interventions DME instruction;Gait training;Stair training;Therapeutic exercise;Therapeutic activities;Functional mobility training;Balance training    PT Goals (Current goals can be found in the Care Plan section)  Acute Rehab PT Goals Patient Stated Goal: home PT Goal Formulation: With patient/family Time For Goal Achievement: 08/21/23 Potential to Achieve Goals: Good    Frequency Min 1X/week     Co-evaluation PT/OT/SLP Co-Evaluation/Treatment: Yes Reason for Co-Treatment: To address functional/ADL transfers PT goals addressed during session: Mobility/safety with mobility         AM-PAC PT "6 Clicks"  Mobility  Outcome Measure Help needed turning from your back to your side while in a flat bed without using bedrails?: A Little Help needed moving from lying on your back to sitting on the side of a flat bed without using bedrails?: A Little Help needed moving to and from a bed to a chair (including a wheelchair)?: A Little Help needed standing up from a chair using your arms (e.g., wheelchair or bedside chair)?: A Little Help needed to walk in hospital room?: A Little Help needed climbing 3-5 steps with a railing? : A Lot 6 Click Score: 17    End of Session Equipment Utilized During Treatment: Gait belt;Oxygen Activity Tolerance: Patient limited by fatigue Patient left: in chair;with call bell/phone within reach;with chair alarm set;with nursing/sitter in room;with family/visitor present Nurse Communication: Mobility status PT Visit Diagnosis: Muscle weakness (generalized) (M62.81);Difficulty in walking, not elsewhere classified (R26.2)    Time: 4098-1191 PT Time Calculation (min) (ACUTE ONLY): 26 min   Charges:   PT Evaluation $PT Eval Low Complexity: 1 Low   PT General Charges $$ ACUTE PT VISIT: 1 Visit         Carollee Nussbaumer,  PT, DPT Acute Rehabilitation Services Secure chat preferred Office #: [phone]   Iona Hansen 08/07/2023, 9:00 AM

## 2023-08-07 NOTE — Evaluation (Signed)
Occupational Therapy Evaluation Patient Details Name: [name] MRN: [id number] DOB: 12-Mar-1951 Today's Date: 08/07/2023   History of Present Illness 73 year old female who was admitted on 2/1 for PNA and hemoptysis PMH: trach dep in setting of OSA/OHS and intolerance of NIPPV,sig h/o subglottic stenosis which has made her phonic and unable to tolerate PMV. She is followed at [facility] for this. Just recently underwent microlaryngoscopy, removal of granulation tissue and tracheal dilation by DR Delford Field on 1/23   Clinical Impression   PTA, pt lives with family and has 24/7 assist, uses RW at baseline, and can do most ADLs with minimal assistance. Pt presents now with deficits in cardiopulmonary endurance, strength and dynamic standing balance. Pt able to mobilize briefly in hallway using RW with CGA though limited by DOE and desats to 70s. Pt able to recover to 90s within 2 min once seated back in room. Pt requires no more than CGA for ADLs. [name] present, confirms ability to continue assisting pt at DC. Anticipate no OT needs at DC but will continue to follow acutely.       If plan is discharge home, recommend the following: A little help with bathing/dressing/bathroom;Assistance with cooking/housework    Functional Status Assessment  Patient has had a recent decline in their functional status and demonstrates the ability to make significant improvements in function in a reasonable and predictable amount of time.  Equipment Recommendations  None recommended by OT    Recommendations for Other Services       Precautions / Restrictions Precautions Precautions: Fall Precaution Comments: watch SPO2, trach dependent at baseline Restrictions Weight Bearing Restrictions Per Provider Order: No      Mobility Bed Mobility Overal bed mobility: Needs Assistance Bed Mobility: Supine to Sit     Supine to sit: Supervision     General bed mobility comments: supervision for line  management, HOB elevated, pt able to bring self to EOB without physical assist    Transfers Overall transfer level: Needs assistance Equipment used: Rolling walker (2 wheels) Transfers: Sit to/from Stand Sit to Stand: Contact guard assist           General transfer comment: contact guard for line management and safety, wide base of support      Balance Overall balance assessment: Mild deficits observed, not formally tested                                         ADL either performed or assessed with clinical judgement   ADL Overall ADL's : Needs assistance/impaired Eating/Feeding: Set up   Grooming: Set up;Sitting;Wash/dry face   Upper Body Bathing: Set up;Sitting   Lower Body Bathing: Contact guard assist;Sitting/lateral leans;Sit to/from stand   Upper Body Dressing : Set up;Sitting   Lower Body Dressing: Contact guard assist;Sitting/lateral leans;Sit to/from stand   Toilet Transfer: Contact guard assist;Ambulation;Rolling walker (2 wheels)   Toileting- Clothing Manipulation and Hygiene: Contact guard assist;Sitting/lateral lean;Sit to/from stand       Functional mobility during ADLs: Contact guard assist;Rolling walker (2 wheels)       Vision Ability to See in Adequate Light: 0 Adequate Patient Visual Report: No change from baseline Vision Assessment?: No apparent visual deficits     Perception         Praxis         Pertinent Vitals/Pain Pain Assessment Pain Assessment: Faces Faces Pain  Scale: Hurts little more Pain Location: R knee - chronic Pain Descriptors / Indicators: Grimacing, Guarding Pain Intervention(s): Monitored during session     Extremity/Trunk Assessment Upper Extremity Assessment Upper Extremity Assessment: Generalized weakness;Right hand dominant   Lower Extremity Assessment Lower Extremity Assessment: Defer to PT evaluation   Cervical / Trunk Assessment Cervical / Trunk Assessment: Normal    Communication Communication Communication: Tracheostomy   Cognition Arousal: Alert Behavior During Therapy: WFL for tasks assessed/performed Overall Cognitive Status: Within Functional Limits for tasks assessed                                 General Comments: pt able to follow commands, eager to mobilize, provided accurate home set up     General Comments  SpO2 dec to 74% on 10Lo2 during amb, s/p 2 min sitting pts SPO2 returned to 90s    Exercises     Shoulder Instructions      Home Living Family/patient expects to be discharged to:: Private residence Living Arrangements: Spouse/significant other;Children Available Help at Discharge: Family;Available 24 hours/day Type of Home: Mobile home Home Access: Stairs to enter;Ramped entrance Entrance Stairs-Number of Steps: 4 Entrance Stairs-Rails: Right;Left;Can reach both Home Layout: One level     Bathroom Shower/Tub: Producer, television/film/video: Handicapped height Bathroom Accessibility: Yes   Home Equipment: Agricultural consultant (2 wheels);BSC/3in1;Grab bars - tub/shower;Grab bars - toilet;Hand held shower head;Hospital bed;Transport chair (has hoyer lift)   Additional Comments: on vent via trach at 4LO2 at night, 2lO2 via trach during the day      Prior Functioning/Environment Prior Level of Function : Needs assist             Mobility Comments: uses RW at baseline, transport chair for long distances ADLs Comments: use shower chair, dtr manages water and assist with washing hair, daughter sets up clothes however pt performs dressing on own, feeds self        OT Problem List: Decreased strength;Decreased activity tolerance;Impaired balance (sitting and/or standing);Cardiopulmonary status limiting activity      OT Treatment/Interventions: Self-care/ADL training;Therapeutic exercise;Energy conservation;DME and/or AE instruction;Therapeutic activities;Patient/family education    OT Goals(Current  goals can be found in the care plan section) Acute Rehab OT Goals Patient Stated Goal: home when ready OT Goal Formulation: With patient/family Time For Goal Achievement: 08/21/23 Potential to Achieve Goals: Good  OT Frequency: Min 1X/week    Co-evaluation PT/OT/SLP Co-Evaluation/Treatment: Yes Reason for Co-Treatment: To address functional/ADL transfers PT goals addressed during session: Mobility/safety with mobility OT goals addressed during session: ADL's and self-care;Proper use of Adaptive equipment and DME      AM-PAC OT "6 Clicks" Daily Activity     Outcome Measure Help from another person eating meals?: None Help from another person taking care of personal grooming?: A Little Help from another person toileting, which includes using toliet, bedpan, or urinal?: A Little Help from another person bathing (including washing, rinsing, drying)?: A Little Help from another person to put on and taking off regular upper body clothing?: A Little Help from another person to put on and taking off regular lower body clothing?: A Little 6 Click Score: 19   End of Session Equipment Utilized During Treatment: Gait belt;Rolling walker (2 wheels);Oxygen Nurse Communication: Mobility status  Activity Tolerance: Patient tolerated treatment well Patient left: in chair;with call bell/phone within reach;with nursing/sitter in room;with family/visitor present  OT Visit Diagnosis: Unsteadiness on feet (R26.81);Muscle weakness (  generalized) (M62.81)                Time: 8119-1478 OT Time Calculation (min): 24 min Charges:  OT General Charges $OT Visit: 1 Visit OT Evaluation $OT Eval Moderate Complexity: 1 Mod  Bradd Canary, OTR/L Acute Rehab Services Office: [phone]   Lorre Munroe 08/07/2023, 9:11 AM

## 2023-08-07 NOTE — Progress Notes (Signed)
NAME:  [name], MRN:  [id number], DOB:  01-30-1951, LOS: 2 ADMISSION DATE:  08/05/2023, CONSULTATION DATE:  2/1 REFERRING MD:  [email or web address], CHIEF COMPLAINT:  PNA and hemoptysis    History of Present Illness:  73 year old female who is trach dep in setting of OSA/OHS and intolerance of NIPPV. Also has sig h/o subglottic stenosis which has made her phonic and unable to tolerate PMV. She is followed at [facility] for this. Just recently underwent microlaryngoscopy, removal of granulation tissue and tracheal dilation by DR Delford Field on 1/23. This was tolerated well. She was discharged home that day and she had been doing well, actually able to speak and tolerating increased time using PMV at home. On 1/31 had some bloody secretions from trach during suction. Later that evening coughed up old dark blood clot about dime sized. 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 (HCC), Obesity hypoventilation syndrome (HCC) WHO group 3 pulmonary arterial hypertension (HCC),Gastroparesis, HFpEF, RV dysfxn, chronic afib CHADSvasc score 5 on DOAC, iron def anemia  Significant Hospital Events: Including procedures, antibiotic start and stop dates in addition to other pertinent events   2/1 admitted w/ hemoptysis and PNA sputum sent started on CAP coverage  2/2 improving hemoptysis  2/3 restart eliquis  Interim History / Subjective:   Up and walking around w walker + daughter today   Objective   Blood pressure 104/72, pulse 85, temperature 97.9 F (36.6 C), temperature source Oral, resp. rate 14, weight 72.9 kg, SpO2 100%.    Vent Mode: AC FiO2 (%):  [40 %] 40 %   Intake/Output Summary (Last 24 hours) at 08/07/2023 1114 Last data  filed at 08/07/2023 0500 Gross per 24 hour  Intake 884.37 ml  Output 600 ml  Net 284.37 ml   Filed Weights   08/06/23 0500 08/07/23 0500  Weight: 76.5 kg 72.9 kg    Examination: General: Very pleasant elderly F NAD  HENT: NCAT trach secure anicteric sclera  Lungs: symmetrical chest expansion, even and unlabored on ATC Cardiovascular: irreg rhythm s1s2 cap refill brisk  Abdomen: soft ndnt  Extremities: no acute joint deformity  Neuro: awake alert following commands  GU: defer  Resolved Hospital Problem list     Assessment & Plan:   Sepsis 2/2 CAP Hemoptysis Chronic hypoxic/hypercarbic resp failure Trach & nocturnal vent dependent Severe OSA/OHS WHO group 3 pulmonary hypertension  Hx tracheal stenosis, s/p dilation and removal of granulation tissue  -suspect hemoptysis 2/2 PNA. Did have recent granulation tissue removal however so consider site bleeding/aspiration P -routine trach care. ATC during day home vent settings at night -VAP, pulm hygiene -azithro rocephin for CAP -restart home eliquis 2/3  -if worse hemoptysis c/f bleeding r/t granulation tissue, txa via facemask (not via trach)   Chronic Afib on chronic AC  Chronic HFpEF  -chadsvasc 5 P -restarting eliquis 2/3 -restarting home lasix 2/3 -metop -jiardiance -optimize lytes PRN   CKD 3a Metabolic acidosis  P -follow renal indices uop   Hx Gastroparesis Plan -BID PPI -bentyl  IDDM  P -SSI   Hypothyroidism  Plan -synthroid   Hx dementia  Hx sz disorder  -her memory is actually very good and really only exacerbated when has acute infection or is  hypercarbic Plan -delirium precautions  -cont home aricept Zoloft buspar and Neurontin  -Keppra   Physical deconditioning P -PT/OT -- family is interested in home PT    Best Practice (right click and "Reselect all SmartList Selections" daily)   Diet/type: Regular consistency (see orders) DVT prophylaxis DOAC Pressure ulcer(s): N/A GI  prophylaxis: PPI Lines: N/A Foley:  N/A Code Status:  full code Last date of multidisciplinary goals of care discussion [pt and daughter updated at bedside 2/3   Labs   CBC: Recent Labs  Lab 08/05/23 1453 08/05/23 1649 08/06/23 0222 08/07/23 0342  WBC 15.6* 17.5* 12.2* 11.2*  NEUTROABS 12.3*  --   --   --   HGB 10.3* 10.7* 9.8* 9.8*  HCT 34.3* 35.1* 31.3* 31.9*  MCV 100.9* 100.3* 99.4 99.1  PLT 137* 141* 110* 133*    Basic Metabolic Panel: Recent Labs  Lab 08/05/23 1453 08/05/23 1649 08/06/23 0222 08/07/23 0342  NA 138  --  139 137  K 4.2  --  4.2 4.2  CL 111  --  113* 110  CO2 17*  --  18* 17*  GLUCOSE 120*  --  87 148*  BUN 23  --  21 21  CREATININE 1.25* 1.16* 1.14* 1.10*  CALCIUM 8.8*  --  8.6* 8.6*  MG  --  2.0 1.9 2.5*  PHOS  --  3.2  --   --    GFR: Estimated Creatinine Clearance: 42.9 mL/min (A) (by C-G formula based on SCr of 1.1 mg/dL (H)). Recent Labs  Lab 08/05/23 1453 08/05/23 1454 08/05/23 1601 08/05/23 1649 08/06/23 0222 08/07/23 0342  PROCALCITON  --   --   --  2.40 2.33 1.58  WBC 15.6*  --   --  17.5* 12.2* 11.2*  LATICACIDVEN  --  0.9 1.5  --   --   --     Liver Function Tests: Recent Labs  Lab 08/05/23 1453  AST 18  ALT 16  ALKPHOS 104  BILITOT 1.2  PROT 6.8  ALBUMIN 3.3*   No results for input(s): "LIPASE", "AMYLASE" in the last 168 hours. No results for input(s): "AMMONIA" in the last 168 hours.  ABG    Component Value Date/Time   PHART 7.554 (H) 01/03/2021 2326   PCO2ART 38.1 01/03/2021 2326   PO2ART 270 (H) 01/03/2021 2326   HCO3 33.6 (H) 01/03/2021 2326   TCO2 35 (H) 01/03/2021 2326   ACIDBASEDEF 1.0 01/26/2015 1132   O2SAT 100.0 01/03/2021 2326     Coagulation Profile: Recent Labs  Lab 08/05/23 1649  INR 1.7*    Cardiac Enzymes: No results for input(s): "CKTOTAL", "CKMB", "CKMBINDEX", "TROPONINI" in the last 168 hours.  HbA1C: Hgb A1c MFr Bld  Date/Time Value Ref Range Status  03/30/2023 11:17 AM  8.1 (H) 4.8 - 5.6 % Final    Comment:             Prediabetes: 5.7 - 6.4          Diabetes: >6.4          Glycemic control for adults with diabetes: <7.0   12/28/2022 10:43 AM 9.1 (H) 4.8 - 5.6 % Final    Comment:             Prediabetes: 5.7 - 6.4          Diabetes: >6.4          Glycemic control for adults with diabetes: <7.0     CBG: Recent Labs  Lab 08/06/23 1133  08/06/23 1521 08/06/23 2122 08/07/23 0641 08/07/23 0827  GLUCAP 114* 109* 202* 151* 171*     CCT n/a    Mod MDM   Tessie Fass MSN, AGACNP-BC [facility] Pulmonary/Critical Care Medicine Amion for pager  08/07/2023, 11:14 AM

## 2023-08-07 NOTE — TOC Initial Note (Addendum)
Transition of Care [facility]) - Initial/Assessment Note    Patient Details  Name: [name] MRN: [id number] Date of Birth: [birth year]  Transition of Care [facility]) CM/SW Contact:    Elliot Cousin, RN Phone Number: [phone] 08/07/2023, 6:28 PM  Clinical Narrative:                 TOC CM spoke to pt's dtr, [name]. Dtr states she will provide transportation home. She has DME at home. Pt active with [facility] RN, will need HH orders for [facility] with F2F. Dtr does not want to change from [facility]. They have her on list to start [facility] PT once it starts. Will need resumption of care orders for [facility] RN with F2F.   Contacted [employer] rep, Haywood Lasso to make aware.   Expected Discharge Plan: Home w Home Health Services Barriers to Discharge: Continued Medical Work up   Patient Goals and CMS Choice Patient states their goals for this hospitalization and ongoing recovery are:: wants pt to get better CMS Medicare.gov Compare Post Acute Care list provided to:: Patient Represenative (must comment) Choice offered to / list presented to : Adult Children      Expected Discharge Plan and Services   Discharge Planning Services: CM Consult Post Acute Care Choice: Home Health Living arrangements for the past 2 months: Single Family Home                           HH Arranged: RN HH Agency: [facility] Date HH Agency Contacted: 08/07/23 Time HH Agency Contacted: [id number] Representative spoke with at [facility] Agency: Haywood Lasso  Prior Living Arrangements/Services Living arrangements for the past 2 months: Single Family Home Lives with:: Adult Children, Spouse Patient language and need for interpreter reviewed:: Yes Do you feel safe going back to the place where you live?: Yes      Need for Family Participation in Patient Care: Yes (Comment) Care giver support system in place?: Yes (comment) Current home services: DME (rolling walker, wheelchair, bedside commode, shower chair, ramp, Trilogy, hospital  bed, oxygen, hoyer lift) Criminal Activity/Legal Involvement Pertinent to Current Situation/Hospitalization: No - Comment as needed  Activities of Daily Living      Permission Sought/Granted Permission sought to share information with : Case Manager, Family Supports, PCP Permission granted to share information with : Yes, Verbal Permission Granted  Share Information with NAME: Alver Fisher  Permission granted to share info w AGENCY: Home Health  Permission granted to share info w Relationship: daughter  Permission granted to share info w Contact Information: [phone]  Emotional Assessment       Orientation: : Oriented to Self   Psych Involvement: No (comment)  Admission diagnosis:  Hemoptysis [R04.2] Community acquired pneumonia, unspecified laterality [J18.9] Patient Active Problem List   Diagnosis Date Noted   Tracheostomy dependent (HCC) 08/06/2023   WHO group 3 pulmonary arterial hypertension (HCC) 06/21/2023   Hospital discharge follow-up 05/15/2023   Aspiration pneumonia (HCC) 04/30/2023   SIRS (systemic inflammatory response syndrome) (HCC) 04/29/2023   Vitamin D deficiency 04/05/2023   Insulin long-term use (HCC) 04/05/2023   Subglottic stenosis    Tracheostomy complication (HCC) 07/18/2021   Chronic systolic CHF (congestive heart failure) (HCC) 07/18/2021   Mild renal insufficiency 07/18/2021   Tracheostomy care (HCC)    Tracheostomy malfunction (HCC)    Subacute cough    Tracheobronchitis    Obesity hypoventilation syndrome (HCC)    Dependence on home ventilator (  HCC)    Physical deconditioning    Insulin dependent type 2 diabetes mellitus, controlled (HCC)    Hemoptysis    Tracheostomy status (HCC)    Seizure disorder (HCC)    Hypercapnic respiratory failure (HCC) 01/03/2021   Elevated troponin    Acute hypoxemic respiratory failure (HCC) 12/21/2020   Acute on chronic respiratory failure with hypoxia and hypercapnia (HCC)    Endotracheal tube  present    Acute on chronic respiratory failure with hypoxia (HCC) 11/22/2020   Altered mental status 11/21/2020   Hypoalbuminemia due to protein-calorie malnutrition (HCC) 11/21/2020   Pressure ulcer 11/21/2020   Acute metabolic encephalopathy 08/30/2020   Acute ischemic stroke [facility])    Respiratory failure with hypoxia (HCC) 08/24/2020   Lobar pneumonia (HCC) 08/24/2020   Septic shock (HCC) 08/24/2020   NSTEMI (non-ST elevated myocardial infarction) (HCC) 08/17/2020   Constipation 03/17/2020   Knee pain 10/07/2019   Fibromyalgia 10/07/2019   Polyarthropathy 10/07/2019   Fibromyositis 09/30/2019   Gait abnormality 09/30/2019   Pain in right knee 09/30/2019   Depression 08/31/2018   Anxiety 08/31/2018   Chronic respiratory failure with hypoxia and hypercapnia (HCC) 12/13/2017   Acute systolic HF (heart failure) (HCC) 11/26/2017   Palliative care by specialist    Goals of care, counseling/discussion    Acute on chronic diastolic CHF (congestive heart failure) (HCC) 08/17/2017   Acute respiratory failure with hypoxia and hypercapnia (HCC) 08/16/2017   Uncontrolled type 2 diabetes mellitus with hyperglycemia (HCC) 08/16/2017   Acute respiratory failure (HCC)    CAP (community acquired pneumonia) 08/05/2017   COPD exacerbation (HCC) 08/05/2017   Pulmonary hypertension (HCC) 04/05/2017   Derangement of posterior horn of medial meniscus of right knee    Lateral meniscus, anterior horn derangement, right    Syncope, near 12/26/2015   Fall at home 12/26/2015   Dysphagia 12/07/2015   IDA (iron deficiency anemia) 03/13/2015   Chronic diastolic CHF (congestive heart failure) (HCC) 02/20/2015   Diffuse abdominal pain 09/16/2014   Ascites 08/27/2014   Macrocytic anemia 07/20/2014   Epistaxis, recurrent 07/20/2014   Seizure (HCC) 04/11/2014   Dementia (HCC) 04/11/2014   Chronic diarrhea 04/11/2014   Type 2 diabetes mellitus (HCC) 04/11/2014   Protein-calorie malnutrition, severe (HCC)  04/11/2014   Atrial fibrillation with RVR (HCC) 12/21/2013   Abnormal weight loss 08/26/2013   Lower abdominal pain 08/26/2013   Anorexia nervosa 08/26/2013   Encounter for therapeutic drug monitoring 08/26/2013   DOE (dyspnea on exertion) 06/29/2013   Tremor 10/27/2012   Chronic anticoagulation 06/23/2011   HYPOTENSION, ORTHOSTATIC 08/26/2009   Hyperlipidemia 03/04/2009   Barrett's esophagus 09/26/2008   Gastroparesis 09/26/2008   Iron deficiency anemia 09/25/2008   Essential hypertension, benign 09/25/2008   Atelectasis 09/25/2008   Irritable bowel syndrome with diarrhea 09/25/2008   OSA (obstructive sleep apnea) 09/25/2008   CARPAL TUNNEL SYNDROME, HX OF 09/25/2008   Hypothyroidism 04/10/2008   Body mass index (BMI) 40.0-44.9, adult (HCC) 04/10/2008   Atrial fibrillation (HCC) 04/10/2008   Acute exacerbation of CHF (congestive heart failure) (HCC) 04/10/2008   Gastroesophageal reflux disease 04/10/2008   Low back pain 04/10/2008   CHEST PAIN-PRECORDIAL 04/10/2008   Obesity 04/10/2008   PCP:  Rica Records, FNP Pharmacy:   [facility] [place], [place] - [id number] [place] [address] Phone: [phone] Fax: [phone]     Social Drivers of Health (SDOH) Social History: SDOH Screenings   Food Insecurity: No Food Insecurity (08/06/2023)  Housing:  Low Risk  (08/06/2023)  Transportation Needs: No Transportation Needs (08/06/2023)  Utilities: Not At Risk (08/06/2023)  Depression (PHQ2-9): Low Risk  (03/29/2023)  Tobacco Use: Medium Risk (06/29/2023)   SDOH Interventions:     Readmission Risk Interventions    12/28/2020    2:35 PM 11/24/2020   12:34 PM  Readmission Risk Prevention Plan  Transportation Screening Complete Complete  HRI or Home Care Consult  Complete  Social Work Consult for Recovery Care Planning/Counseling  Complete  Palliative Care Screening  Not Applicable  Medication Review [employer])  Complete Complete  HRI or Home Care Consult Complete   SW Recovery Care/Counseling Consult Complete   Palliative Care Screening --   Skilled Nursing Facility Patient Refused

## 2023-08-08 DIAGNOSIS — R042 Hemoptysis: Secondary | ICD-10-CM | POA: Diagnosis not present

## 2023-08-08 DIAGNOSIS — J189 Pneumonia, unspecified organism: Secondary | ICD-10-CM | POA: Diagnosis not present

## 2023-08-08 LAB — CBC
HCT: 28.9 % — ABNORMAL LOW (ref 36.0–46.0)
Hemoglobin: 9.1 g/dL — ABNORMAL LOW (ref 12.0–15.0)
MCH: 30.5 pg (ref 26.0–34.0)
MCHC: 31.5 g/dL (ref 30.0–36.0)
MCV: 97 fL (ref 80.0–100.0)
Platelets: 125 10*3/uL — ABNORMAL LOW (ref 150–400)
RBC: 2.98 MIL/uL — ABNORMAL LOW (ref 3.87–5.11)
RDW: 15.1 % (ref 11.5–15.5)
WBC: 7 10*3/uL (ref 4.0–10.5)
nRBC: 0 % (ref 0.0–0.2)

## 2023-08-08 LAB — BASIC METABOLIC PANEL
Anion gap: 9 (ref 5–15)
BUN: 18 mg/dL (ref 8–23)
CO2: 20 mmol/L — ABNORMAL LOW (ref 22–32)
Calcium: 8.4 mg/dL — ABNORMAL LOW (ref 8.9–10.3)
Chloride: 108 mmol/L (ref 98–111)
Creatinine, Ser: 1.19 mg/dL — ABNORMAL HIGH (ref 0.44–1.00)
GFR, Estimated: 48 mL/min — ABNORMAL LOW (ref >60)
Glucose, Bld: 154 mg/dL — ABNORMAL HIGH (ref 70–99)
Potassium: 4 mmol/L (ref 3.5–5.1)
Sodium: 137 mmol/L (ref 135–145)

## 2023-08-08 LAB — PROCALCITONIN: Procalcitonin: 1 ng/mL

## 2023-08-08 LAB — GLUCOSE, CAPILLARY
Glucose-Capillary: 158 mg/dL — ABNORMAL HIGH (ref 70–99)
Glucose-Capillary: 218 mg/dL — ABNORMAL HIGH (ref 70–99)

## 2023-08-08 MED ORDER — LEVOFLOXACIN 500 MG PO TABS
500.0000 mg | ORAL_TABLET | Freq: Every day | ORAL | Status: DC
  Administered 2023-08-08: 500 mg via ORAL
  Filled 2023-08-08: qty 1

## 2023-08-08 MED ORDER — LEVOFLOXACIN 500 MG PO TABS: 500.0000 mg | ORAL_TABLET | Freq: Every day | ORAL | 0 refills | Status: AC

## 2023-08-08 NOTE — Discharge Instructions (Signed)
F/u PCP 1-2 weeks F/u [facility] 1-2 weeks

## 2023-08-08 NOTE — Progress Notes (Signed)
 NAME:  [name], MRN:  [id number], DOB:  June 05, 1951, LOS: 3 ADMISSION DATE:  08/05/2023, CONSULTATION DATE:  2/1 REFERRING MD:  [email or web address], CHIEF COMPLAINT:  PNA and hemoptysis    History of Present Illness:  73 year old female who is trach dep in setting of OSA/OHS and intolerance of NIPPV. Also has sig h/o subglottic stenosis which has made her phonic and unable to tolerate PMV. She is followed at [facility] for this. Just recently underwent microlaryngoscopy, removal of granulation tissue and tracheal dilation by DR Brien on 1/23. This was tolerated well. She was discharged home that day and she had been doing well, actually able to speak and tolerating increased time using PMV at home. On 1/31 had some bloody secretions from trach during suction. Later that evening coughed up old dark blood clot about dime sized. She was instructed to hold Eliquis  on 2/1 and if not better or symptoms worsen she would need to come to ER for eval   Pertinent  Medical History  Tracheostomy status (HCC), Subglottic stenosis,  OSA (obstructive sleep apnea)Chronic respiratory failure with hypoxia and hypercapnia (HCC) Nocturnal Dependence on home ventilator (HCC), Obesity hypoventilation syndrome (HCC) WHO group 3 pulmonary arterial hypertension (HCC),Gastroparesis, HFpEF, RV dysfxn, chronic afib CHADSvasc score 5 on DOAC, iron  def anemia  Significant Hospital Events: Including procedures, antibiotic start and stop dates in addition to other pertinent events   2/1 admitted w/ hemoptysis and PNA sputum sent started on CAP coverage  2/2 improving hemoptysis  2/3 restart eliquis   Interim History / Subjective:   Eliquis  resumed yesterday No hemoptysis Walking around unit yesterday w/ PT   Objective   Blood pressure 121/60, pulse 66, temperature 98.8 F (37.1 C), temperature source Axillary, resp. rate 12, weight 79.3 kg, SpO2 100%.    Vent Mode: AC FiO2 (%):  [40 %] 40 %   Intake/Output Summary (Last 24  hours) at 08/08/2023 0902 Last data filed at 08/08/2023 0400 Gross per 24 hour  Intake 950 ml  Output 1250 ml  Net -300 ml   Filed Weights   08/06/23 0500 08/07/23 0500 08/08/23 0500  Weight: 76.5 kg 72.9 kg 79.3 kg    Examination: General:  NAD HEENT: MM pink/moist; trach in place Neuro: Aox3; MAE CV: s1s2, RRR, no m/r/g PULM:  dim clear BS bilaterally; TC GI: soft, bsx4 active  Extremities: warm/dry, no edema  Skin: no rashes or lesions    Resolved Hospital Problem list     Assessment & Plan:   Sepsis 2/2 CAP Hemoptysis Chronic hypoxic/hypercarbic resp failure Trach & nocturnal vent dependent Severe OSA/OHS WHO group 3 pulmonary hypertension  Hx tracheal stenosis, s/p dilation and removal of granulation tissue  -suspect hemoptysis 2/2 PNA. Did have recent granulation tissue removal however so consider site bleeding/aspiration P: -eliquis  resumed yesterday; no hemoptysis -cont trach collar for sats >92% -cont rocephin /azithro -pulm toiletry -trach car per protocol  Chronic Afib on chronic AC  Chronic HFpEF  HLD -chadsvasc 5 P: -eliquis  -cont lasix , jardiance , metoprolol  -statin  CKD 3a Metabolic acidosis  P: -Trend BMP / urinary output -Replace electrolytes as indicated -Avoid nephrotoxic agents, ensure adequate renal perfusion  Hx Gastroparesis P: -BID PPI -bentyl   IDDM  P: -SSI and cbg monitoring  Hypothyroidism  P: -synthroid    Hx dementia  Hx sz disorder  -her memory is actually very good and really only exacerbated when has acute infection or is hypercarbic P: -delirium precautions  -cont home aricept , Zoloft , buspar , and Neurontin   -Keppra   Physical deconditioning P: -PT/OT   Best Practice (right click and Reselect all SmartList Selections daily)   Diet/type: Regular consistency (see orders) DVT prophylaxis DOAC Pressure ulcer(s): N/A GI prophylaxis: PPI Lines: N/A Foley:  N/A Code Status:  full code Last date of  multidisciplinary goals of care discussion [pt and daughter updated at bedside 2/3   Labs   CBC: Recent Labs  Lab 08/05/23 1453 08/05/23 1649 08/06/23 0222 08/07/23 0342 08/08/23 0514  WBC 15.6* 17.5* 12.2* 11.2* 7.0  NEUTROABS 12.3*  --   --   --   --   HGB 10.3* 10.7* 9.8* 9.8* 9.1*  HCT 34.3* 35.1* 31.3* 31.9* 28.9*  MCV 100.9* 100.3* 99.4 99.1 97.0  PLT 137* 141* 110* 133* 125*    Basic Metabolic Panel: Recent Labs  Lab 08/05/23 1453 08/05/23 1649 08/06/23 0222 08/07/23 0342 08/08/23 0514  NA 138  --  139 137 137  K 4.2  --  4.2 4.2 4.0  CL 111  --  113* 110 108  CO2 17*  --  18* 17* 20*  GLUCOSE 120*  --  87 148* 154*  BUN 23  --  21 21 18   CREATININE 1.25* 1.16* 1.14* 1.10* 1.19*  CALCIUM  8.8*  --  8.6* 8.6* 8.4*  MG  --  2.0 1.9 2.5*  --   PHOS  --  3.2  --   --   --    GFR: Estimated Creatinine Clearance: 41.4 mL/min (A) (by C-G formula based on SCr of 1.19 mg/dL (H)). Recent Labs  Lab 08/05/23 1454 08/05/23 1601 08/05/23 1649 08/06/23 0222 08/07/23 0342 08/08/23 0514  PROCALCITON  --   --  2.40 2.33 1.58 1.00  WBC  --   --  17.5* 12.2* 11.2* 7.0  LATICACIDVEN 0.9 1.5  --   --   --   --     Liver Function Tests: Recent Labs  Lab 08/05/23 1453  AST 18  ALT 16  ALKPHOS 104  BILITOT 1.2  PROT 6.8  ALBUMIN  3.3*   No results for input(s): LIPASE, AMYLASE in the last 168 hours. No results for input(s): AMMONIA in the last 168 hours.  ABG    Component Value Date/Time   PHART 7.554 (H) 01/03/2021 2326   PCO2ART 38.1 01/03/2021 2326   PO2ART 270 (H) 01/03/2021 2326   HCO3 33.6 (H) 01/03/2021 2326   TCO2 35 (H) 01/03/2021 2326   ACIDBASEDEF 1.0 01/26/2015 1132   O2SAT 100.0 01/03/2021 2326     Coagulation Profile: Recent Labs  Lab 08/05/23 1649  INR 1.7*    Cardiac Enzymes: No results for input(s): CKTOTAL, CKMB, CKMBINDEX, TROPONINI in the last 168 hours.  HbA1C: Hgb A1c MFr Bld  Date/Time Value Ref Range Status   03/30/2023 11:17 AM 8.1 (H) 4.8 - 5.6 % Final    Comment:             Prediabetes: 5.7 - 6.4          Diabetes: >6.4          Glycemic control for adults with diabetes: <7.0   12/28/2022 10:43 AM 9.1 (H) 4.8 - 5.6 % Final    Comment:             Prediabetes: 5.7 - 6.4          Diabetes: >6.4          Glycemic control for adults with diabetes: <7.0     CBG: Recent Labs  Lab 08/07/23 0827  08/07/23 1129 08/07/23 1604 08/07/23 2137 08/08/23 0812  GLUCAP 171* 155* 184* 184* 158*     CCT n/a    JD Emilio RIGGERS [facility] 08/08/2023, 9:50 AM  Please see [email or web address] for pager details.  From 7A-7P if no response, please call [phone]. After hours, please call ELink [phone].

## 2023-08-08 NOTE — Plan of Care (Signed)
  Problem: Education: Goal: Ability to describe self-care measures that may prevent or decrease complications (Diabetes Survival Skills Education) will improve Outcome: Progressing   Problem: Coping: Goal: Ability to adjust to condition or change in health will improve Outcome: Progressing   Problem: Fluid Volume: Goal: Ability to maintain a balanced intake and output will improve Outcome: Progressing   Problem: Health Behavior/Discharge Planning: Goal: Ability to identify and utilize available resources and services will improve Outcome: Progressing   Problem: Metabolic: Goal: Ability to maintain appropriate glucose levels will improve Outcome: Progressing   

## 2023-08-08 NOTE — Plan of Care (Signed)
Patient ready for discharge home with daughter. Daughter manages vent and trach. Has no questions related to discharge and care of tracheostomy/vent care. Discharge instructions given to patient and daughter.

## 2023-08-08 NOTE — Discharge Summary (Signed)
 Physician Discharge Summary         Patient ID: [name] MRN: [id number] DOB/AGE: 75-05-1951 75 y.o.  Admit date: 08/05/2023 Discharge date: 08/08/2023  Discharge Diagnoses:    Active Hospital Problems   Diagnosis Date Noted   CAP (community acquired pneumonia) 08/05/2017   Tracheostomy dependent (HCC) 08/06/2023   WHO group 3 pulmonary arterial hypertension (HCC) 06/21/2023   Subglottic stenosis    Obesity hypoventilation syndrome (HCC)    Dependence on home ventilator (HCC)    Hemoptysis    Tracheostomy status (HCC)    Chronic respiratory failure with hypoxia and hypercapnia (HCC) 12/13/2017   Seizure (HCC) 04/11/2014   Chronic diarrhea 04/11/2014   Type 2 diabetes mellitus (HCC) 04/11/2014   Chronic anticoagulation 06/23/2011   Hyperlipidemia 03/04/2009   OSA (obstructive sleep apnea) 09/25/2008   Iron  deficiency anemia 09/25/2008   Essential hypertension, benign 09/25/2008   Hypothyroidism 04/10/2008   Atrial fibrillation (HCC) 04/10/2008   Gastroesophageal reflux disease 04/10/2008    Resolved Hospital Problems  No resolved problems to display.      Discharge summary   73 year old female who is trach dep in setting of OSA/OHS and intolerance of NIPPV. Also has sig h/o subglottic stenosis which has made her phonic and unable to tolerate PMV. She is followed at [facility] for this. Just recently underwent microlaryngoscopy, removal of granulation tissue and tracheal dilation by DR Brien on 1/23. This was tolerated well. She was discharged home that day and she had been doing well, actually able to speak and tolerating increased time using PMV at home. On 1/31 had some bloody secretions from trach during suction. Later that evening coughed up old dark blood clot about dime sized. She was instructed to hold Eliquis  on 2/1 and if not better or symptoms worsen she would need to come to ER for eval. Hemoptysis persisted and came to [facility] ED for further eval. CXR w/ possibl pna.  Started on rocephin /azithro. Eliquis  held for a few days. Resumed Eliquis  on 2/3. On 2/4 no further hemoptysis and stable for discharge.    Discharge Plan by Active Problems    Sepsis 2/2 CAP Hemoptysis Chronic hypoxic/hypercarbic resp failure Trach & nocturnal vent dependent Severe OSA/OHS WHO group 3 pulmonary hypertension  Hx tracheal stenosis, s/p dilation and removal of granulation tissue  -suspect hemoptysis 2/2 PNA. Did have recent granulation tissue removal however so consider site bleeding/aspiration P: -cont eliquis  -switch to levaquin  for 5 days -follow cultures -tracheal suction as needed -cont vent at night -f/u w/ Jeralyn Banner in trach clinic in 1-2 weeks  Chronic Afib on chronic AC  Chronic HFpEF  HLD -chadsvasc 5 P: -resume home eliquis , lasix , jardiance , metoprolol , statin -f/u outpt PCP   CKD 3a P: -f/u outpt PCP   Hx Gastroparesis P: -cont home PPI and bentyl    IDDM  P: -cont home insulin  and jardiance    Hypothyroidism  P: -synthroid     Hx dementia  Hx sz disorder  -her memory is actually very good and really only exacerbated when has acute infection or is hypercarbic P: -cont home aricept , Zoloft , buspar , and Neurontin   -Keppra     Physical deconditioning P: -TOC consult for PT/OT outpt   Significant Hospital tests/ studies   Procedures    Culture data/antimicrobials      Consults      Discharge Exam: BP 96/82   Pulse 83   Temp 98.8 F (37.1 C) (Axillary)   Resp (!) 24   Wt 79.3 kg  SpO2 94%   BMI 30.97 kg/m   General:  NAD HEENT: MM pink/moist; trach in place Neuro: Aox3; MAE CV: s1s2, RRR, no m/r/g PULM:  dim clear BS bilaterally; TC GI: soft, bsx4 active  Extremities: warm/dry, no edema  Skin: no rashes or lesions   Labs at discharge   Lab Results  Component Value Date   CREATININE 1.19 (H) 08/08/2023   BUN 18 08/08/2023   NA 137 08/08/2023   K 4.0 08/08/2023   CL 108 08/08/2023   CO2 20 (L)  08/08/2023   Lab Results  Component Value Date   WBC 7.0 08/08/2023   HGB 9.1 (L) 08/08/2023   HCT 28.9 (L) 08/08/2023   MCV 97.0 08/08/2023   PLT 125 (L) 08/08/2023   Lab Results  Component Value Date   ALT 16 08/05/2023   AST 18 08/05/2023   ALKPHOS 104 08/05/2023   BILITOT 1.2 08/05/2023   Lab Results  Component Value Date   INR 1.7 (H) 08/05/2023   INR 1.3 (H) 07/18/2021   INR 1.1 01/11/2021    Current radiological studies    No results found.  Disposition:    Discharge disposition: 01-Home or Self Care       Discharge Instructions     Call MD for:  difficulty breathing, headache or visual disturbances   Complete by: As directed    Diet - low sodium heart healthy   Complete by: As directed    Discharge instructions   Complete by: As directed    F/u w/ Jeralyn Banner in trach clinic 1-2 weeks F/u w/ PCP in 1-2 weeks   Increase activity slowly   Complete by: As directed    No wound care   Complete by: As directed        Allergies as of 08/08/2023       Reactions   Codeine Nausea And Vomiting, Other (See Comments)   Hallucinations   Dilaudid  [hydromorphone  Hcl] Other (See Comments)   Syncope   Reglan  [metoclopramide ] Other (See Comments)   Dyskinesia   Levsin  [hyoscyamine ] Diarrhea   Caused worse diarrhea        Medication List     STOP taking these medications    cefdinir  300 MG capsule Commonly known as: OMNICEF    glycopyrrolate  1 MG tablet Commonly known as: ROBINUL    lidocaine  2 % jelly Commonly known as: XYLOCAINE    LORazepam  1 MG tablet Commonly known as: ATIVAN    mupirocin  ointment 2 % Commonly known as: BACTROBAN    potassium chloride  10 MEQ tablet Commonly known as: KLOR-CON  M   trolamine salicylate 10 % cream Commonly known as: ASPERCREME       TAKE these medications    acetaminophen  650 MG CR tablet Commonly known as: TYLENOL  Take 1,300 mg by mouth 2 (two) times daily.   atorvastatin  40 MG tablet Commonly  known as: Lipitor  Take 1 tablet (40 mg total) by mouth daily.   busPIRone  30 MG tablet Commonly known as: BUSPAR  Take 1 tablet (30 mg total) by mouth in the morning and at bedtime.   dicyclomine  10 MG capsule Commonly known as: BENTYL  TAKE 1 CAPSULE BY MOUTH UP TO 4 TIMES DAILY BEFORE MEALS AND AT BEDTIME (STOP IF CONSTIPATION).   donepezil  5 MG tablet Commonly known as: ARICEPT  TAKE 1 TABLET BY MOUTH AT BEDTIME FOR dementia   Eliquis  5 MG Tabs tablet Generic drug: apixaban  TAKE 1 TABLET BY MOUTH TWICE DAILY AS A BLOOD THINNER   FreeStyle Libre 2  Sensor Misc Use daily   FreeStyle Libre 2 Sensor Misc FOR CONTINUOUS GLUCOSE MONITORING.   FreeStyle Libre 2 Sensor Misc APPLY ONE SENSOR TO THE SKIN EVERY 14 DAYS AS DIRECTED TO CONTINUOUSLY MONITOR GLUCOSE LEVELS   furosemide  20 MG tablet Commonly known as: LASIX  Take 1 tablet (20 mg total) by mouth daily.   gabapentin  100 MG capsule Commonly known as: NEURONTIN  TAKE ONE CAPSULE BY MOUTH THREE TIMES DAILY FOR NEUROPATHIC PAIN   ipratropium-albuterol  0.5-2.5 (3) MG/3ML Soln Commonly known as: DUONEB Take 3 mLs by nebulization every 4 (four) hours as needed (Asthma).   Jardiance  10 MG Tabs tablet Generic drug: empagliflozin  TAKE 1 TABLET BY MOUTH DAILY.   Lantus  SoloStar 100 UNIT/ML Solostar Pen Generic drug: insulin  glargine Inject 20 Units into the skin at bedtime.   levETIRAcetam  500 MG tablet Commonly known as: KEPPRA  TAKE 1 TABLET BY MOUTH TWICE DAILY FOR SEIZURE PREVENTION   levofloxacin  500 MG tablet Commonly known as: LEVAQUIN  Take 1 tablet (500 mg total) by mouth daily for 5 days.   levothyroxine  150 MCG tablet Commonly known as: SYNTHROID  Take 1 tablet (150 mcg total) by mouth daily before breakfast.   loperamide  2 MG capsule Commonly known as: IMODIUM  Take 2 mg by mouth every 8 (eight) hours as needed for diarrhea or loose stools.   metoprolol  tartrate 25 MG tablet Commonly known as: LOPRESSOR  TAKE  1 TABLET BY MOUTH TWICE DAILY FOR HIGH BLOOD PRESSURE OR HEART RATE CONTROL   nitroGLYCERIN  0.4 MG SL tablet Commonly known as: NITROSTAT  Place 0.4 mg under the tongue every 5 (five) minutes as needed for chest pain.   NovoLOG  FlexPen 100 UNIT/ML FlexPen Generic drug: insulin  aspart Inject 0-8 Units into the skin 3 (three) times daily with meals.   pantoprazole  40 MG tablet Commonly known as: PROTONIX  TAKE 1 TABLET BY MOUTH TWICE DAILY FOR acid reflux   sertraline  50 MG tablet Commonly known as: Zoloft  Take 1 tablet (50 mg total) by mouth daily.   traMADol  50 MG tablet Commonly known as: ULTRAM  Take 1 tablet (50 mg total) by mouth 3 (three) times daily as needed for moderate pain (pain score 4-6) or severe pain (pain score 7-10).   VITAMIN D -3 PO Take 1 tablet by mouth daily in the afternoon.         Follow-up appointment   PCP 2 weeks Jeralyn Banner trach clinic in 1-2 weeks for trach needs Discharge Condition:    stable  Physician Statement:   The Patient was personally examined, the discharge assessment and plan has been personally reviewed and I agree with JD Emanie Behan's PA-C assessment and plan. 35 minutes minutes of time have been dedicated to discharge assessment, planning and discharge instructions.   Signed: Norleen JONETTA [name] 08/08/2023, 12:39 PM

## 2023-08-08 NOTE — TOC Transition Note (Signed)
 Transition of Care [facility]) - Discharge Note   Patient Details  Name: [name] MRN: [id number] Date of Birth: May 03, 1951  [facility] CM/SW Contact:  Sudie Erminio Deems, RN Phone Number: 08/08/2023, 1:49 PM   Clinical Narrative: Patient transitioning home today. Daughter to transport patient in private vehicle. [facility] aware that the patient will transition home today. Orders to be written for [facility] PT/RN with face to face. No further home needs identified.        Barriers to Discharge: No Barriers Identified   Patient Goals and CMS Choice Patient states their goals for this hospitalization and ongoing recovery are:: wants pt to get better CMS Medicare.gov Compare Post Acute Care list provided to:: Patient Represenative (must comment) Choice offered to / list presented to : Adult Children    Discharge Plan and Services Additional resources added to the After Visit Summary for     Discharge Planning Services: CM Consult Post Acute Care Choice: Home Health                    HH Arranged: RN, Disease Management, PT HH Agency: [facility] Date [facility] Agency Contacted: 08/08/23 Time HH Agency Contacted: 1348 Representative spoke with at [facility] Agency: Arna  Social Drivers of Health (SDOH) Interventions SDOH Screenings   Food Insecurity: No Food Insecurity (08/06/2023)  Housing: Low Risk  (08/06/2023)  Transportation Needs: No Transportation Needs (08/06/2023)  Utilities: Not At Risk (08/06/2023)  Depression (PHQ2-9): Low Risk  (03/29/2023)  Tobacco Use: Medium Risk (06/29/2023)     Readmission Risk Interventions    12/28/2020    2:35 PM 11/24/2020   12:34 PM  Readmission Risk Prevention Plan  Transportation Screening Complete Complete  HRI or Home Care Consult  Complete  Social Work Consult for Recovery Care Planning/Counseling  Complete  Palliative Care Screening  Not Applicable  Medication Review Oceanographer) Complete Complete  HRI  or Home Care Consult Complete   SW Recovery Care/Counseling Consult Complete   Palliative Care Screening --   Skilled Nursing Facility Patient Refused

## 2023-08-09 ENCOUNTER — Other Ambulatory Visit: Payer: Self-pay | Admitting: Family Medicine

## 2023-08-10 LAB — CULTURE, BLOOD (ROUTINE X 2)
Culture: NO GROWTH
Culture: NO GROWTH

## 2023-08-15 ENCOUNTER — Other Ambulatory Visit: Payer: Self-pay | Admitting: Family Medicine

## 2023-08-15 ENCOUNTER — Ambulatory Visit: Payer: PPO | Admitting: Cardiovascular Disease

## 2023-08-15 ENCOUNTER — Other Ambulatory Visit: Payer: Self-pay | Admitting: Acute Care

## 2023-08-15 MED ORDER — FLUCONAZOLE 150 MG PO TABS: 150.0000 mg | ORAL_TABLET | Freq: Once | ORAL | 0 refills | Status: AC

## 2023-08-21 ENCOUNTER — Ambulatory Visit (INDEPENDENT_AMBULATORY_CARE_PROVIDER_SITE_OTHER): Payer: PPO | Admitting: Orthopedic Surgery

## 2023-08-21 ENCOUNTER — Encounter: Payer: Self-pay | Admitting: Orthopedic Surgery

## 2023-08-21 VITALS — BP 113/63 | HR 78

## 2023-08-21 DIAGNOSIS — M1711 Unilateral primary osteoarthritis, right knee: Secondary | ICD-10-CM | POA: Diagnosis not present

## 2023-08-21 MED ORDER — METHYLPREDNISOLONE ACETATE 40 MG/ML IJ SUSP
40.0000 mg | Freq: Once | INTRAMUSCULAR | Status: AC
  Administered 2023-08-21: 40 mg via INTRA_ARTICULAR

## 2023-08-21 NOTE — Progress Notes (Signed)
FOLLOW-UP OFFICE VISIT   Patient: [name]           Date of Birth: 11-Apr-1951           MRN: [id number] Visit Date: 08/21/2023 Requested by: Rica Records, FNP [id number] S. [address] [place],  [place] [address] PCP: Rica Records, FNP    Encounter Diagnosis  Name Primary?   Primary osteoarthritis of right knee Yes    Chief Complaint  Patient presents with   Knee Pain    Right    73 year old female status post injection right knee for osteoarthritis with history of atrial fibrillation on Eliquis, recently had a tracheostomy secondary to sleep apnea and inability to tolerate a mask, patient on home vent.  2 L of O2  Ambulates with a walker in the home with minimal assist.  Patient presents now with continued pain in her right knee status post injection which she says raised her sugar and glucose and did not help the knee pain in a significant manner.  Mild improvement was noted.  Reviewing her medical history my advice is that she not have any surgery and that she is not a candidate for surgery however the patient and her daughter would like her evaluated for possible knee replacement  Patient has consult arranged with Dr. Magnus Ivan  She did consent to an injection today understanding that she would have to wait 3 months before surgical intervention if orthopedic doctor in [place] thought that it was feasible.  Procedure note right knee injection   verbal consent was obtained to inject right knee joint  Timeout was completed to confirm the site of injection  The medications used were depomedrol 40 mg and 1% lidocaine 3 cc Anesthesia was provided by ethyl chloride and the skin was prepped with alcohol.  After cleaning the skin with alcohol a 20-gauge needle was used to inject the right knee joint. There were no complications. A sterile bandage was applied.   ASSESSMENT AND PLAN   Encounter Diagnosis  Name Primary?   Primary osteoarthritis of  right knee Yes    Referral for consultation for consideration for knee replacement

## 2023-08-21 NOTE — Progress Notes (Signed)
   There were no vitals taken for this visit.  There is no height or weight on file to calculate BMI.  Chief Complaint  Patient presents with   Knee Pain    Right     No diagnosis found.  DOI/DOS/ Date: long duration getting worse  Worse/ injection didn't help much and raised blood sugar

## 2023-08-21 NOTE — Patient Instructions (Signed)
We are referring you to [facility] from [facility] Office address is [address] [place] The phone number is [phone]  The office will call you with an appointment Dr. Blackman    

## 2023-08-22 ENCOUNTER — Other Ambulatory Visit: Payer: Self-pay | Admitting: Family Medicine

## 2023-08-22 ENCOUNTER — Ambulatory Visit (HOSPITAL_COMMUNITY)
Admission: RE | Admit: 2023-08-22 | Discharge: 2023-08-22 | Disposition: A | Discharge status: Home / Self Care | Payer: PPO | Source: Ambulatory Visit | Attending: Acute Care | Admitting: Acute Care

## 2023-08-22 ENCOUNTER — Other Ambulatory Visit: Payer: Self-pay | Admitting: Acute Care

## 2023-08-22 DIAGNOSIS — J9611 Chronic respiratory failure with hypoxia: Secondary | ICD-10-CM | POA: Diagnosis not present

## 2023-08-22 DIAGNOSIS — E662 Morbid (severe) obesity with alveolar hypoventilation: Secondary | ICD-10-CM | POA: Diagnosis present

## 2023-08-22 DIAGNOSIS — Z930 Tracheostomy status: Secondary | ICD-10-CM | POA: Insufficient documentation

## 2023-08-22 DIAGNOSIS — J209 Acute bronchitis, unspecified: Secondary | ICD-10-CM | POA: Diagnosis present

## 2023-08-22 DIAGNOSIS — I2723 Pulmonary hypertension due to lung diseases and hypoxia: Secondary | ICD-10-CM | POA: Diagnosis present

## 2023-08-22 DIAGNOSIS — I272 Pulmonary hypertension, unspecified: Secondary | ICD-10-CM

## 2023-08-22 DIAGNOSIS — J9612 Chronic respiratory failure with hypercapnia: Secondary | ICD-10-CM

## 2023-08-22 DIAGNOSIS — Z430 Encounter for attention to tracheostomy: Secondary | ICD-10-CM | POA: Diagnosis not present

## 2023-08-22 DIAGNOSIS — J40 Bronchitis, not specified as acute or chronic: Secondary | ICD-10-CM

## 2023-08-22 DIAGNOSIS — G4733 Obstructive sleep apnea (adult) (pediatric): Secondary | ICD-10-CM | POA: Diagnosis not present

## 2023-08-22 DIAGNOSIS — J386 Stenosis of larynx: Secondary | ICD-10-CM | POA: Diagnosis present

## 2023-08-22 DIAGNOSIS — Z9911 Dependence on respirator [ventilator] status: Secondary | ICD-10-CM

## 2023-08-22 MED ORDER — FLUCONAZOLE 150 MG PO TABS: 150.0000 mg | ORAL_TABLET | Freq: Once | ORAL | 2 refills | Status: AC

## 2023-08-22 MED ORDER — AMOXICILLIN-POT CLAVULANATE 500-125 MG PO TABS: 500.0000 mg | ORAL_TABLET | Freq: Two times a day (BID) | ORAL | 0 refills | Status: DC

## 2023-08-22 MED ORDER — TRAMADOL HCL 50 MG PO TABS: 50.0000 mg | ORAL_TABLET | Freq: Three times a day (TID) | ORAL | 0 refills | Status: DC | PRN

## 2023-08-22 NOTE — Progress Notes (Signed)
Reason for visit Planned trach change  HPI 73 year old pt well known to me. Follow her for trach dependence for OHS,OSA on nocturnal ventilation. I am seeing her today for planned trach change AND post-hospital follow up from her recent hospital stay on 2/1 thru 2/4 for PNA.    Recent ENT and additional history  -Referred to ENT May 2023 for subglottis stenosis. subglottic mass removed  -direct laryngoscopy and airway evaluation in September 2023.  Large subglottic posteriorly-based granuloma which was eminating superiorly through the glottis, biopsied and send for permanent pathology; thick circumferential subglottic granulations tissue, which was removed with cupped forceps and coblator  Much improved subglottic airway following the procedure F/u 11/8: had been doing better initially post-op.  he visualized subglottis and proximal trachea are patent and the Shiley tracheostomy tube is visible in the subglottis the trachea is edematous and there does not appear to be much room around the tracheostomy device they recommended fenestrated trach which we placed.  This was subsequently discontinued as the patient did not tolerate it. She has surgery scheduled for 1/23 at [facility] w/ plan for Micro laryngectomy, removal of granulation, tracheal dilation and kenalog injection w/ hope that this will allow her to phonate and tolerate PMV again. We have agreed we do not want to work towards decannulation as she has been intol of BIPAP in past. While waiting I see she has had a f/u echo and c/w 2022 her EF has improved from 45-50% to 55-60%, there is mod LVH, RV fxn mildly reduced, she has severe elevation of PAS (88 mmHg).  1/23 s/p DL removal of granulation tissue and tracheal dilation. Tolerated well. Speech improved post-operatively.  2/1 thru 2/4 admitted to [facility] for PNA    Review of Systems  Constitutional:  Positive for malaise/fatigue. Negative for fever.       Decreased energy Wants to be on vent  more  HENT:  Positive for congestion.   Eyes: Negative.   Respiratory:  Positive for cough and sputum production.        Cough green again. Initially better. Over last few days worse. Wanting to stay on vent more   Cardiovascular:  Negative for leg swelling.  Gastrointestinal: Negative.   Genitourinary: Negative.   Musculoskeletal: Negative.   Skin: Negative.     Exam General 73 year old female presenting to trach clinic in no acute distress HENT #6 cuffed trach is unremarkable. Has not had energy to use PMV Pulm coarse rhonchi. Decreased in the right base w/ bronchial breath sounds Card rrr Abd soft Ext warm  Neuro baseline   Procedure Pre-medicated w/ inhaled lidocaine. The # 6 trach was removed. Site inspected and looked clean and unremarkable. A new trach was placed over obturator. Pt tolerated well. Placement verified via ETCO2   Active Problems:   Tracheostomy status (HCC)   Subglottic stenosis   OSA (obstructive sleep apnea)   Chronic respiratory failure with hypoxia and hypercapnia (HCC)   Dependence on home ventilator (HCC)   Obesity hypoventilation syndrome (HCC)   WHO group 3 pulmonary arterial hypertension (HCC)   Acute tracheobronchitis    Tracheostomy status (HCC) Overview:  Trach Brand: Shiley Size:  6.0 6CN75R Style: Cuffed Secured by: Velcro  Last change today 2/28  prior change 1/23 at [facility]  Discussion   Plan Continue routine trach care ROV 4 weeks for planned tracheostomy change Continue nocturnal ventilation    Subglottic stenosis Overview: Seemed to be  better s/p surgical removal of granulation  tissue and tracheal dilation but more recently not really attempting PMV due to fatigue and inc'd WOB Plan Resume PMV as tolerated    Acute tracheobronchitis Overview: Recently treated for CAP. Got better. Does not appear toxic but RLL raising concern for PNA.  Plan Will give her 7 d course of augmentin (adjusted for her creatinine  clearance Daughter to call me if not improved. She may need CXR and sputum culture and consideration for coverage of nosocomial org given recent hospital stay       My time 23 min   [name] ACNP-BC [facility] Pulmonary/Critical Care Pager # [phone] OR # [phone] if no answer

## 2023-08-22 NOTE — Progress Notes (Signed)
Tracheostomy Procedure Note  NAYDA RIESEN [id number] [birth year]  Pre Procedure Tracheostomy Information  Trach Brand: Shiley Size:  6.0  6CN75H Style: Cuffed Secured by: Velcro   Procedure: Trach Change and Trach Cleaning    Post Procedure Tracheostomy Information  Trach Brand: Shiley Size:  6.0  6CN75  H Style: Cuffed Secured by: Velcro   Post Procedure Evaluation:  ETCO2 positive color change from yellow to purple : Yes.   Vital signs:VSS Patients current condition: stable Complications: No apparent complications Trach site exam: clean, dry Wound care done: dry Patient did tolerate procedure well.   Education: none  Prescription needs: none    Additional needs: none

## 2023-08-25 ENCOUNTER — Inpatient Hospital Stay (HOSPITAL_COMMUNITY): Admission: RE | Admit: 2023-08-25 | Payer: PPO | Source: Ambulatory Visit

## 2023-08-31 LAB — HM DIABETES EYE EXAM

## 2023-09-05 ENCOUNTER — Other Ambulatory Visit: Payer: Self-pay | Admitting: Family Medicine

## 2023-09-07 ENCOUNTER — Other Ambulatory Visit: Payer: Self-pay | Admitting: Family Medicine

## 2023-09-12 ENCOUNTER — Encounter: Payer: Self-pay | Admitting: Cardiovascular Disease

## 2023-09-12 ENCOUNTER — Ambulatory Visit: Payer: PPO | Attending: Cardiovascular Disease | Admitting: Cardiovascular Disease

## 2023-09-12 VITALS — BP 118/72 | HR 59 | Ht 65.0 in | Wt 153.8 lb

## 2023-09-12 DIAGNOSIS — I872 Venous insufficiency (chronic) (peripheral): Secondary | ICD-10-CM

## 2023-09-12 DIAGNOSIS — I4891 Unspecified atrial fibrillation: Secondary | ICD-10-CM | POA: Diagnosis not present

## 2023-09-12 DIAGNOSIS — I10 Essential (primary) hypertension: Secondary | ICD-10-CM | POA: Diagnosis not present

## 2023-09-12 DIAGNOSIS — I5022 Chronic systolic (congestive) heart failure: Secondary | ICD-10-CM

## 2023-09-12 DIAGNOSIS — I739 Peripheral vascular disease, unspecified: Secondary | ICD-10-CM

## 2023-09-12 NOTE — Progress Notes (Signed)
 Cardiology Office Note   Date:  09/12/2023   ID:  Glee, [place]ash[place] 05/03/1951, MRN [id number]  PCP:  Rica Records, FNP  Cardiologist: Dr. Dina Rich  No chief complaint on file.     History of Present Illness: [name] is a 73 y.o. female who is here today for a follow-up visit regarding chronic venous insufficiency and peripheral arterial disease.   She has known history of chronic systolic heart failure with pulmonary hypertension, atrial fibrillation on Eliquis anticoagulation, chronic respiratory failure with tracheostomy, diabetes mellitus, previous tobacco use, essential hypertension and hyperlipidemia. She was seen last year for bluish discoloration in both feet with swelling that is worse at the end of the day.  She also complained of cold sensation in both feet.  Venous Doppler showed no evidence of DVT.  Arterial Doppler showed noncompressible vessels with slightly abnormal TBI.  She had palpable pulses by exam and her symptoms were felt to be due to chronic venous insufficiency mostly. Her lower extremity edema improved significantly on small dose furosemide.  She has no leg pain.  The discoloration also improved.   Past Medical History:  Diagnosis Date   Abnormal pulmonary function test    Anemia    H/H of 10/30 with a normal MCV in 12/09   Anxiety    Arthritis    Barrett's esophagus    Diagnosed 1995. Last EGD 2016-NO BARRETT'S.    Chest pain    a. 2022 Cath: nl cors; b. 2008 Neg myoview; c. 01/2014 Cath: Nl cors. EF 40%; c. 08/2020 NSTEMI/Cath [facility]): nl cors.   Chronic anticoagulation    Chronic combined systolic and diastolic CHF (congestive heart failure) (HCC)    a. 10/2014 Echo: EF40%; b. 01/2015 Echo: EF 55-60%; c. 11/2020 Echo: EF 55-60%, mod LVH, sev BAE, mild MR, mod TR. PASP ; d. 01/2021 Echo: EF 45-50%, glob HK, mod conc LVH, mod reduced RV fxn, RVSP 60.44mmHg, Mod TR, mild to mod MR, sev dil LA.   Chronic LBP    Surgical  intervention in 1996   Diabetes mellitus, type 2 (HCC)    Insulin therapy; exacerbated by prednisone   Gastroparesis    99% retention 05/2008 on GES   GERD (gastroesophageal reflux disease)    Heart attack (HCC) 08/13/2020   Hiatal hernia    Hyperlipidemia    Hypertension    Hypothyroid    IBS (irritable bowel syndrome)    Obesity    OSA on CPAP    had CPAP and cannot tolerate.   Paroxysmal atrial fibrillation (HCC)    a.CHA2DS2VASc = 6-->eliquis. Also on Amio.   Pulmonary hypertension (HCC) 01/2015   a. 01/2015 RHC: Predominantly pulmonary venous hypertension but may be component of PAH (PA mean 52, PCWP 31); b. 01/2021 Echo: RVSP 60.68mmHg w/ mod reduced RV fxn.   Seizures (HCC)    last seizure was 2 years ago; on keppra for this; unknown etiology   Syncope    a. Admitted 05/2009; magnetic resonance imagin/ MRA - negative; etiology thought to be orthostasis secondary to drugs and dehydration. b. Syncope 02/2015 also felt 2/2 dehydration.    Past Surgical History:  Procedure Laterality Date   BACK SURGERY  1996   BIOPSY N/A 11/08/2013   Procedure: BIOPSY  / Tissue sampling / ulcers present in small intestine;  Surgeon: West Bali, MD;  Location: [facility];  Service: Endoscopy;  Laterality: N/A;   CARDIAC CATHETERIZATION  2002  CARDIAC CATHETERIZATION N/A 01/26/2015   Procedure: Right Heart Cath;  Surgeon: Laurey Morale, MD;  Location: [facility] INVASIVE CV LAB;  Service: Cardiovascular;  Laterality: N/A;   CARDIOVASCULAR STRESS TEST  2008   Stress nuclear study   CARDIOVERSION N/A 03/06/2015   Procedure: CARDIOVERSION;  Surgeon: Laurey Morale, MD;  Location: [facility] ENDOSCOPY;  Service: Cardiovascular;  Laterality: N/A;   CARPAL TUNNEL RELEASE  1994   CATARACT EXTRACTION W/PHACO Right 01/10/2022   Procedure: CATARACT EXTRACTION PHACO AND INTRAOCULAR LENS PLACEMENT (IOC);  Surgeon: [name], MD;  Location: [facility] ORS;  Service: Ophthalmology;  Laterality: Right;  CDE: 21.51    CATARACT EXTRACTION W/PHACO Left 01/24/2022   Procedure: CATARACT EXTRACTION PHACO AND INTRAOCULAR LENS PLACEMENT (IOC);  Surgeon: [name], MD;  Location: [facility] ORS;  Service: Ophthalmology;  Laterality: Left;  CDE: 17.13   COLONOSCOPY  11/2011   Dr. Darrick Penna: Internal hemorrhoids, mild diverticulosis. Random colon biopsies negative.   COLONOSCOPY N/A 11/08/2013   SLF: Normal mucosa in the terminal ileum/The colon IS redundant/  Moderate diverticulosis throughout the entire colon. ileum bx benign. colon bx benign   CRYOTHERAPY  01/23/2021   Procedure: CRYOTHERAPY;  Surgeon: Lorin Glass, MD;  Location: [facility] ENDOSCOPY;  Service: Pulmonary;;   CRYOTHERAPY  01/24/2021   Procedure: Cloyde Reams;  Surgeon: Lorin Glass, MD;  Location: [facility] ENDOSCOPY;  Service: Pulmonary;;   CRYOTHERAPY  01/26/2021   Procedure: Cloyde Reams;  Surgeon: Lupita Leash, MD;  Location: [facility] ENDOSCOPY;  Service: Cardiopulmonary;;   ESOPHAGOGASTRODUODENOSCOPY  2008   Barrett's without dysplasia. esphagus dilated. antral erosions, h.pylori serologies negative.   ESOPHAGOGASTRODUODENOSCOPY  11/2011   Dr. Darrick Penna: Barrett's esophagus, mild gastritis, diverticulum in the second portion of the duodenum repeat EGD 3 years. Small bowel biopsies negative. Gastric biopsy show reactive gastropathy but no H. pylori. Esophageal biopsies consistent with GERD. Next EGD 11/2014   ESOPHAGOGASTRODUODENOSCOPY N/A 11/21/2014   ZOX:WRUE non-erosive gastritis/irregular z-line   ESOPHAGOGASTRODUODENOSCOPY  03/2022   FOREIGN BODY REMOVAL  01/23/2021   Procedure: FOREIGN BODY REMOVAL;  Surgeon: Lorin Glass, MD;  Location: [facility] ENDOSCOPY;  Service: Pulmonary;;   FOREIGN BODY REMOVAL  01/24/2021   Procedure: FOREIGN BODY REMOVAL;  Surgeon: Lorin Glass, MD;  Location: [facility] ENDOSCOPY;  Service: Pulmonary;;   GIVENS CAPSULE STUDY  12/07/2011   Proximal small bowel, rare AVM. Distal small bowel, multiple ulcers noted   GIVENS CAPSULE STUDY N/A  09/27/2013   Distal small bowel ulcers extending to TI.   GIVENS CAPSULE STUDY N/A 10/10/2013   Procedure: GIVENS CAPSULE STUDY;  Surgeon: West Bali, MD;  Location: [facility];  Service: Endoscopy;  Laterality: N/A;  7:30   HEMOSTASIS CONTROL  01/24/2021   Procedure: HEMOSTASIS CONTROL;  Surgeon: Lorin Glass, MD;  Location: [facility] ENDOSCOPY;  Service: Pulmonary;;   HEMOSTASIS CONTROL  01/26/2021   Procedure: HEMOSTASIS CONTROL;  Surgeon: Lupita Leash, MD;  Location: [facility] ENDOSCOPY;  Service: Cardiopulmonary;;   IR GASTROSTOMY TUBE MOD SED  01/11/2021   IR GASTROSTOMY TUBE REMOVAL  04/13/2021   IR KYPHO THORACIC WITH BONE BIOPSY  02/09/2018   KNEE ARTHROSCOPY WITH MEDIAL MENISECTOMY Right 06/09/2016   Procedure: KNEE ARTHROSCOPY WITH MEDIAL MENISECTOMY;  Surgeon: Vickki Hearing, MD;  Location: [facility] ORS;  Service: Orthopedics;  Laterality: Right;  medial and lateral menisectomy   LAMINECTOMY  1995   L4-L5   LAPAROSCOPIC CHOLECYSTECTOMY  1990s   LEFT HEART CATHETERIZATION WITH CORONARY ANGIOGRAM  01/10/2014   Procedure: LEFT HEART  CATHETERIZATION WITH CORONARY ANGIOGRAM;  Surgeon: Lesleigh Noe, MD;  Location: [facility] CATH LAB;  Service: Cardiovascular;;   MICROLARYNGOSCOPY WITH LASER AND BALLOON DILATION N/A 11/17/2021   Procedure: MICROLARYNGOSCOPY WITH CO2 LASER, SUBGLOTTIC LARYNGEAL MASS BIOSPY;  Surgeon: Serena Colonel, MD;  Location: [facility] OR;  Service: ENT;  Laterality: N/A;   RIGHT HEART CATHETERIZATION N/A 01/10/2014   Procedure: RIGHT HEART CATH;  Surgeon: Lesleigh Noe, MD;  Location: [facility] CATH LAB;  Service: Cardiovascular;  Laterality: N/A;   TOTAL ABDOMINAL HYSTERECTOMY  1999   TRACHEOSTOMY REVISION N/A 07/18/2021   Procedure: TRACHEOSTOMY REVISION;  Surgeon: Suzanna Obey, MD;  Location: [facility] OR;  Service: ENT;  Laterality: N/A;   TRACHEOSTOMY TUBE PLACEMENT N/A 09/01/2017   Procedure: TRACHEOSTOMY;  Surgeon: Drema Halon, MD;  Location: [facility] OR;  Service: ENT;   Laterality: N/A;   VIDEO BRONCHOSCOPY Bilateral 01/23/2021   Procedure: VIDEO BRONCHOSCOPY WITHOUT FLUORO;  Surgeon: Lorin Glass, MD;  Location: [facility] ENDOSCOPY;  Service: Pulmonary;  Laterality: Bilateral;   VIDEO BRONCHOSCOPY Bilateral 01/24/2021   Procedure: VIDEO BRONCHOSCOPY WITHOUT FLUORO;  Surgeon: Lorin Glass, MD;  Location: [facility] ENDOSCOPY;  Service: Pulmonary;  Laterality: Bilateral;   VIDEO BRONCHOSCOPY N/A 01/26/2021   Procedure: VIDEO BRONCHOSCOPY WITHOUT FLUORO;  Surgeon: Lupita Leash, MD;  Location: [facility] ENDOSCOPY;  Service: Cardiopulmonary;  Laterality: N/A;     Current Outpatient Medications  Medication Sig Dispense Refill   acetaminophen (TYLENOL) 650 MG CR tablet Take 1,300 mg by mouth 2 (two) times daily.     atorvastatin (LIPITOR) 40 MG tablet Take 1 tablet (40 mg total) by mouth daily. 90 tablet 3   busPIRone (BUSPAR) 30 MG tablet Take 1 tablet (30 mg total) by mouth in the morning and at bedtime. 60 tablet 3   Cholecalciferol (VITAMIN D-3 PO) Take 1 tablet by mouth daily in the afternoon.     Continuous Glucose Sensor (FREESTYLE LIBRE 2 SENSOR) MISC FOR CONTINUOUS GLUCOSE MONITORING. 2 each 11   Continuous Glucose Sensor (FREESTYLE LIBRE 2 SENSOR) MISC Use daily 1 each 3   Continuous Glucose Sensor (FREESTYLE LIBRE 2 SENSOR) MISC APPLY ONE SENSOR TO THE SKIN EVERY 14 DAYS AS DIRECTED TO CONTINUOUSLY MONITOR GLUCOSE LEVELS 1 each 2   dicyclomine (BENTYL) 10 MG capsule TAKE 1 CAPSULE BY MOUTH UP TO 4 TIMES DAILY BEFORE MEALS AND AT BEDTIME (STOP IF CONSTIPATION). 120 capsule 3   donepezil (ARICEPT) 5 MG tablet TAKE 1 TABLET BY MOUTH AT BEDTIME FOR dementia 90 tablet 3   ELIQUIS 5 MG TABS tablet TAKE 1 TABLET BY MOUTH TWICE DAILY AS A BLOOD THINNER 180 tablet 3   gabapentin (NEURONTIN) 100 MG capsule TAKE ONE CAPSULE BY MOUTH THREE TIMES DAILY FOR NEUROPATHIC PAIN 270 capsule 3   insulin aspart (NOVOLOG FLEXPEN) 100 UNIT/ML FlexPen Inject 0-8 Units into the skin 3  (three) times daily with meals. 15 mL 4   insulin glargine (LANTUS SOLOSTAR) 100 UNIT/ML Solostar Pen Inject 20 Units into the skin at bedtime. 15 mL 4   ipratropium-albuterol (DUONEB) 0.5-2.5 (3) MG/3ML SOLN Take 3 mLs by nebulization every 4 (four) hours as needed (Asthma).     JARDIANCE 10 MG TABS tablet TAKE ONE TABLET BY MOUTH EVERY DAY. 30 tablet 0   levETIRAcetam (KEPPRA) 500 MG tablet TAKE 1 TABLET BY MOUTH TWICE DAILY FOR SEIZURE PREVENTION 180 tablet 3   levothyroxine (SYNTHROID) 150 MCG tablet Take 1 tablet (150 mcg total) by mouth daily before breakfast. 30 tablet  1   loperamide (IMODIUM) 2 MG capsule Take 2 mg by mouth every 8 (eight) hours as needed for diarrhea or loose stools.     LORazepam (ATIVAN) 1 MG tablet Take 1 to 1 1/2 tablets once daily as needed for anxiety or to aid with sleep 45 tablet 0   metoprolol tartrate (LOPRESSOR) 25 MG tablet TAKE 1 TABLET BY MOUTH TWICE DAILY FOR HIGH BLOOD PRESSURE OR HEART RATE CONTROL 180 tablet 3   nitroGLYCERIN (NITROSTAT) 0.4 MG SL tablet Place 0.4 mg under the tongue every 5 (five) minutes as needed for chest pain.     pantoprazole (PROTONIX) 40 MG tablet TAKE 1 TABLET BY MOUTH TWICE DAILY FOR acid reflux 180 tablet 3   sertraline (ZOLOFT) 50 MG tablet Take 1 tablet (50 mg total) by mouth daily. 30 tablet 3   traMADol (ULTRAM) 50 MG tablet Take 1 tablet (50 mg total) by mouth 3 (three) times daily as needed for moderate pain (pain score 4-6) or severe pain (pain score 7-10). 90 tablet 0   furosemide (LASIX) 20 MG tablet Take 1 tablet (20 mg total) by mouth daily. 100 tablet 3   No current facility-administered medications for this visit.    Allergies:   Codeine, Dilaudid [hydromorphone hcl], Reglan [metoclopramide], and Levsin [hyoscyamine]    Social History:  The patient  reports that she quit smoking about 30 years ago. Her smoking use included cigarettes. She started smoking about 57 years ago. She has a 6.8 pack-year smoking  history. She has never used smokeless tobacco. She reports that she does not currently use alcohol. She reports that she does not use drugs.   Family History:  The patient's family history includes Alzheimer's disease in her mother; Breast cancer in her sister; Heart attack in her mother; Hypertension in her mother and another family member; Stroke in her mother.    ROS:  Please see the history of present illness.   Otherwise, review of systems are positive for none.   All other systems are reviewed and negative.    PHYSICAL EXAM: VS:  BP 118/72   Pulse (!) 59   Ht 5\' 5"  (1.651 m)   Wt 153 lb 12.8 oz (69.8 kg)   SpO2 98%   BMI 25.59 kg/m  , BMI Body mass index is 25.59 kg/m. GEN: Well nourished, well developed, in no acute distress  HEENT: normal  Neck: no JVD, carotid bruits, or masses Cardiac: Irregular; no murmurs, rubs, or gallops, Respiratory:  clear to auscultation bilaterally, normal work of breathing GI: soft, nontender, nondistended, + BS MS: no deformity or atrophy  Skin: warm and dry, no rash Neuro:  Strength and sensation are intact Psych: euthymic mood, full affect Vascular: Dorsalis pedis is +2 bilaterally and posterior tibial is +1.  No lower extremity edema.  She does have cyanosis and her feet are cold.   EKG:  EKG is not ordered today.    Recent Labs: 08/04/2023: TSH 0.64 08/05/2023: ALT 16; B Natriuretic Peptide 617.8 08/07/2023: Magnesium 2.5 08/08/2023: BUN 18; Creatinine, Ser 1.19; Hemoglobin 9.1; Platelets 125; Potassium 4.0; Sodium 137    Lipid Panel    Component Value Date/Time   CHOL 77 (L) 03/30/2023 1117   TRIG 80 03/30/2023 1117   HDL 35 (L) 03/30/2023 1117   CHOLHDL 2.2 03/30/2023 1117   CHOLHDL 3.9 08/29/2020 0416   VLDL 23 08/29/2020 0416   LDLCALC 25 03/30/2023 1117      Wt Readings from Last 3 Encounters:  09/12/23 153 lb 12.8 oz (69.8 kg)  08/08/23 174 lb 13.2 oz (79.3 kg)  05/30/23 169 lb (76.7 kg)           No data to  display            ASSESSMENT AND PLAN:  1.  Chronic venous insufficiency: The patient's leg edema and cyanosis is likely due to chronic venous insufficiency.  Swelling and edema improved significantly and she currently takes small dose of furosemide.  2.  Atrial fibrillation: Currently on anticoagulation with Eliquis.  Ventricular rate is controlled.  3.  Chronic systolic/diastolic heart failure with pulmonary hypertension, currently on furosemide 40 mg once daily.  This is likely contributing to lower extremity edema as well.  4.  Essential hypertension: Blood pressure is controlled.  5.  Peripheral arterial disease: Her vessels were calcified with noncompressible ABI and abnormal toe pressure.  Her dorsalis pedis is palpable bilaterally but her feet are very cold and I suspect she has some degree of microvascular disease.  Currently with no significant discomfort or ulceration.  Continue medical therapy.   Disposition:   FU with me in 12 months  Signed,  Lorine Bears, MD  09/12/2023 1:15 PM    [facility]

## 2023-09-12 NOTE — Patient Instructions (Signed)
Medication Instructions:  No changes *If you need a refill on your cardiac medications before your next appointment, please call your pharmacy*   Lab Work: None ordered If you have labs (blood work) drawn today and your tests are completely normal, you will receive your results only by: MyChart Message (if you have MyChart) OR A paper copy in the mail If you have any lab test that is abnormal or we need to change your treatment, we will call you to review the results.   Testing/Procedures: None ordered   Follow-Up: At  [facility]Care, you and your health needs are our priority.  As part of our continuing mission to provide you with exceptional heart care, we have created designated Provider Care Teams.  These Care Teams include your primary Cardiologist (physician) and Advanced Practice Providers (APPs -  Physician Assistants and Nurse Practitioners) who all work together to provide you with the care you need, when you need it.  We recommend signing up for the patient portal called "MyChart".  Sign up information is provided on this After Visit Summary.  MyChart is used to connect with patients for Virtual Visits (Telemedicine).  Patients are able to view lab/test results, encounter notes, upcoming appointments, etc.  Non-urgent messages can be sent to your provider as well.   To learn more about what you can do with MyChart, go to [email or web address].    Your next appointment:   12 month(s)  Provider:   Dr. Arida 

## 2023-09-14 ENCOUNTER — Encounter: Payer: Self-pay | Admitting: Family Medicine

## 2023-09-14 ENCOUNTER — Other Ambulatory Visit: Payer: Self-pay | Admitting: Family Medicine

## 2023-09-14 ENCOUNTER — Ambulatory Visit (INDEPENDENT_AMBULATORY_CARE_PROVIDER_SITE_OTHER): Payer: PPO | Admitting: Family Medicine

## 2023-09-14 VITALS — BP 110/70 | HR 53 | Ht 65.0 in | Wt 156.0 lb

## 2023-09-14 DIAGNOSIS — F32A Depression, unspecified: Secondary | ICD-10-CM

## 2023-09-14 DIAGNOSIS — M797 Fibromyalgia: Secondary | ICD-10-CM | POA: Diagnosis not present

## 2023-09-14 DIAGNOSIS — F419 Anxiety disorder, unspecified: Secondary | ICD-10-CM

## 2023-09-14 DIAGNOSIS — I10 Essential (primary) hypertension: Secondary | ICD-10-CM | POA: Diagnosis not present

## 2023-09-14 DIAGNOSIS — Z1211 Encounter for screening for malignant neoplasm of colon: Secondary | ICD-10-CM

## 2023-09-14 DIAGNOSIS — E559 Vitamin D deficiency, unspecified: Secondary | ICD-10-CM | POA: Diagnosis not present

## 2023-09-14 NOTE — Patient Instructions (Signed)
   Great to see you today.  I have refilled the medication(s) we provide.    If labs were collected, we will inform you of lab results once received either by echart message or telephone call.   - echart message- for normal results that have been seen by the patient already.   - telephone call: abnormal results or if patient has not viewed results in their echart.   - Please take medications as prescribed. - Follow up with your primary health provider if any health concerns arises. - If symptoms worsen please contact your primary care provider and/or visit the emergency department.

## 2023-09-14 NOTE — Progress Notes (Signed)
 Established Patient Office Visit   Subjective  Patient ID: [name], female    DOB: July 09, 1950  Age: 73 y.o. MRN: [id number]  Chief Complaint  Patient presents with   Medical Management of Chronic Issues    4 month Chronic follow up Referral for colonoscopy    She  has a past medical history of Abnormal pulmonary function test, Anemia, Anxiety, Arthritis, Barrett's esophagus, Chest pain, Chronic anticoagulation, Chronic combined systolic and diastolic CHF (congestive heart failure) (HCC), Chronic LBP, Diabetes mellitus, type 2 (HCC), Gastroparesis, GERD (gastroesophageal reflux disease), Heart attack (HCC) (08/13/2020), Hiatal hernia, Hyperlipidemia, Hypertension, Hypothyroid, IBS (irritable bowel syndrome), Obesity, OSA on CPAP, Paroxysmal atrial fibrillation (HCC), Pulmonary hypertension (HCC) (01/2015), Seizures (HCC), and Syncope.  HPI Patient presents to the clinic for chronic follow up. For the details of today's visit, please refer to assessment and plan.   Review of Systems  Constitutional:  Negative for chills and fever.  Respiratory:  Negative for shortness of breath.   Cardiovascular:  Negative for chest pain.  Gastrointestinal:  Negative for abdominal pain.  Genitourinary:  Negative for dysuria.  Neurological:  Negative for dizziness and headaches.      Objective:     BP 110/70   Pulse (!) 53   Ht 5\' 5"  (1.651 m)   Wt 156 lb (70.8 kg)   SpO2 96%   BMI 25.96 kg/m  BP Readings from Last 3 Encounters:  09/14/23 110/70  09/12/23 118/72  08/21/23 113/63      Physical Exam Vitals reviewed.  Constitutional:      General: She is not in acute distress.    Appearance: Normal appearance. She is not ill-appearing, toxic-appearing or diaphoretic.  HENT:     Head: Normocephalic.  Eyes:     General:        Right eye: No discharge.        Left eye: No discharge.     Conjunctiva/sclera: Conjunctivae normal.  Cardiovascular:     Rate and Rhythm: Normal rate.      Pulses: Normal pulses.     Heart sounds: Normal heart sounds.  Pulmonary:     Effort: Pulmonary effort is normal. No respiratory distress.     Breath sounds: Normal breath sounds.     Comments:  Tracheostomy stoma clean and dry 2L oxygen via tracheostomy  Abdominal:     General: Bowel sounds are normal.     Palpations: Abdomen is soft.     Tenderness: There is no abdominal tenderness. There is no right CVA tenderness, left CVA tenderness or guarding.  Skin:    General: Skin is warm and dry.     Capillary Refill: Capillary refill takes less than 2 seconds.  Neurological:     Mental Status: She is alert.  Psychiatric:        Mood and Affect: Mood normal.        Behavior: Behavior normal.      No results found for any visits on 09/14/23.  The ASCVD Risk score (Arnett DK, et al., 2019) failed to calculate for the following reasons:   Risk score cannot be calculated because patient has a medical history suggesting prior/existing ASCVD    Assessment & Plan:  Screening for colon cancer -     Cologuard  Primary hypertension -     BMP8+eGFR -     CBC with Differential/Platelet -     Lipid panel  Vitamin D deficiency -     VITAMIN D 25 Hydroxy (  Vit-D Deficiency, Fractures)  Essential hypertension, benign Assessment & Plan: Controlled Continue metoprolol tartrate (LOPRESSOR) 25 MG twice daiy Labs ordered. Discussed with  patient to monitor their blood pressure regularly and maintain a heart-healthy diet rich in fruits, vegetables, whole grains, and low-fat dairy, while reducing sodium intake to less than 2,300 mg per day. Regular physical activity, such as 30 minutes of moderate exercise most days of the week, will help lower blood pressure and improve overall cardiovascular health. Avoiding smoking, limiting alcohol consumption, and managing stress. Take  prescribed medication, & take it as directed and avoid skipping doses. Seek emergency care if your blood pressure is  (over 180/100) or you experience chest pain, shortness of breath, or sudden vision changes.Patient verbalizes understanding regarding plan of care and all questions answered.    Anxiety and depression Assessment & Plan: Flowsheet Row Office Visit from 03/29/2023 in [facility]  PHQ-9 Total Score 2         03/29/2023    1:14 PM 12/27/2022    5:07 PM  GAD 7 : Generalized Anxiety Score  Nervous, Anxious, on Edge 0 2  Control/stop worrying 1 2  Worry too much - different things 1 1  Trouble relaxing 1 1  Restless 0 0  Easily annoyed or irritable 0 1  Afraid - awful might happen 0 1  Total GAD 7 Score 3 8  Anxiety Difficulty  Not difficult at all    Controlled, Continue Zoloft 50 mg once daily, buspar 30 mg twice daily, Ativan 1 mg PRN Continued discussion on lifestyle changes, establishing a daily routine, going outdoors, exercise, healthy eating habits, mindfulness and mediatation.    Fibromyalgia Assessment & Plan: Continue Tramadol 50 mg PRN Discussed to prioritize gentle exercise, flexibility and reduce stiffness. Maintain good sleep hygiene and manage stress through relaxation techniques such as meditation or deep breathing. Heat therapy, massage, and a balanced diet can also help alleviate symptoms.     Return in about 6 months (around 03/16/2024), or if symptoms worsen or fail to improve, for chronic follow-up.   Cruzita Lederer Newman Nip, FNP

## 2023-09-14 NOTE — Assessment & Plan Note (Addendum)
 Continue Tramadol 50 mg PRN Discussed to prioritize gentle exercise, flexibility and reduce stiffness. Maintain good sleep hygiene and manage stress through relaxation techniques such as meditation or deep breathing. Heat therapy, massage, and a balanced diet can also help alleviate symptoms.

## 2023-09-14 NOTE — Assessment & Plan Note (Signed)
 Controlled Continue metoprolol tartrate (LOPRESSOR) 25 MG twice daiy Labs ordered. Discussed with  patient to monitor their blood pressure regularly and maintain a heart-healthy diet rich in fruits, vegetables, whole grains, and low-fat dairy, while reducing sodium intake to less than 2,300 mg per day. Regular physical activity, such as 30 minutes of moderate exercise most days of the week, will help lower blood pressure and improve overall cardiovascular health. Avoiding smoking, limiting alcohol consumption, and managing stress. Take  prescribed medication, & take it as directed and avoid skipping doses. Seek emergency care if your blood pressure is (over 180/100) or you experience chest pain, shortness of breath, or sudden vision changes.Patient verbalizes understanding regarding plan of care and all questions answered.

## 2023-09-14 NOTE — Assessment & Plan Note (Signed)
 Flowsheet Row Office Visit from 03/29/2023 in [facility]  PHQ-9 Total Score 2         03/29/2023    1:14 PM 12/27/2022    5:07 PM  GAD 7 : Generalized Anxiety Score  Nervous, Anxious, on Edge 0 2  Control/stop worrying 1 2  Worry too much - different things 1 1  Trouble relaxing 1 1  Restless 0 0  Easily annoyed or irritable 0 1  Afraid - awful might happen 0 1  Total GAD 7 Score 3 8  Anxiety Difficulty  Not difficult at all    Controlled, Continue Zoloft 50 mg once daily, buspar 30 mg twice daily, Ativan 1 mg PRN Continued discussion on lifestyle changes, establishing a daily routine, going outdoors, exercise, healthy eating habits, mindfulness and mediatation.

## 2023-09-14 NOTE — Telephone Encounter (Signed)
 LOV 05/15/23 Next OV 09/14/23 Last refill 08/15/23, #45, 0 refills  Please review, thanks!

## 2023-09-15 ENCOUNTER — Encounter: Payer: Self-pay | Admitting: Family Medicine

## 2023-09-15 LAB — VITAMIN D 25 HYDROXY (VIT D DEFICIENCY, FRACTURES): Vit D, 25-Hydroxy: 40.8 ng/mL (ref 30.0–100.0)

## 2023-09-15 LAB — CBC WITH DIFFERENTIAL/PLATELET
Basophils Absolute: 0.1 10*3/uL (ref 0.0–0.2)
Basos: 1 %
EOS (ABSOLUTE): 0.1 10*3/uL (ref 0.0–0.4)
Eos: 2 %
Hematocrit: 40.1 % (ref 34.0–46.6)
Hemoglobin: 12.7 g/dL (ref 11.1–15.9)
Immature Grans (Abs): 0 10*3/uL (ref 0.0–0.1)
Immature Granulocytes: 0 %
Lymphocytes Absolute: 2.6 10*3/uL (ref 0.7–3.1)
Lymphs: 42 %
MCH: 30 pg (ref 26.6–33.0)
MCHC: 31.7 g/dL (ref 31.5–35.7)
MCV: 95 fL (ref 79–97)
Monocytes Absolute: 0.5 10*3/uL (ref 0.1–0.9)
Monocytes: 8 %
Neutrophils Absolute: 2.9 10*3/uL (ref 1.4–7.0)
Neutrophils: 47 %
Platelets: 147 10*3/uL — ABNORMAL LOW (ref 150–450)
RBC: 4.23 x10E6/uL (ref 3.77–5.28)
RDW: 13.9 % (ref 11.7–15.4)
WBC: 6.2 10*3/uL (ref 3.4–10.8)

## 2023-09-15 LAB — LIPID PANEL
Chol/HDL Ratio: 2.1 ratio (ref 0.0–4.4)
Cholesterol, Total: 76 mg/dL — ABNORMAL LOW (ref 100–199)
HDL: 36 mg/dL — ABNORMAL LOW (ref >39)
LDL Chol Calc (NIH): 19 mg/dL (ref 0–99)
Triglycerides: 110 mg/dL (ref 0–149)
VLDL Cholesterol Cal: 21 mg/dL (ref 5–40)

## 2023-09-15 LAB — BMP8+EGFR
BUN/Creatinine Ratio: 19 (ref 12–28)
BUN: 23 mg/dL (ref 8–27)
CO2: 17 mmol/L — ABNORMAL LOW (ref 20–29)
Calcium: 9 mg/dL (ref 8.7–10.3)
Chloride: 112 mmol/L — ABNORMAL HIGH (ref 96–106)
Creatinine, Ser: 1.23 mg/dL — ABNORMAL HIGH (ref 0.57–1.00)
Glucose: 184 mg/dL — ABNORMAL HIGH (ref 70–99)
Potassium: 4.4 mmol/L (ref 3.5–5.2)
Sodium: 141 mmol/L (ref 134–144)
eGFR: 46 mL/min/{1.73_m2} — ABNORMAL LOW (ref >59)

## 2023-09-22 ENCOUNTER — Encounter: Payer: Self-pay | Admitting: Family Medicine

## 2023-09-22 ENCOUNTER — Other Ambulatory Visit: Payer: Self-pay | Admitting: Family Medicine

## 2023-09-26 ENCOUNTER — Other Ambulatory Visit: Payer: Self-pay | Admitting: Family Medicine

## 2023-09-27 ENCOUNTER — Other Ambulatory Visit: Payer: Self-pay | Admitting: Family Medicine

## 2023-09-29 ENCOUNTER — Other Ambulatory Visit: Payer: Self-pay | Admitting: Family Medicine

## 2023-09-29 ENCOUNTER — Telehealth: Payer: Self-pay | Admitting: Pharmacy Technician

## 2023-09-29 ENCOUNTER — Ambulatory Visit (HOSPITAL_COMMUNITY)
Admission: RE | Admit: 2023-09-29 | Discharge: 2023-09-29 | Disposition: A | Discharge status: Home / Self Care | Source: Ambulatory Visit | Attending: Family Medicine | Admitting: Family Medicine

## 2023-09-29 ENCOUNTER — Other Ambulatory Visit (HOSPITAL_COMMUNITY): Payer: Self-pay

## 2023-09-29 DIAGNOSIS — J9611 Chronic respiratory failure with hypoxia: Secondary | ICD-10-CM | POA: Insufficient documentation

## 2023-09-29 DIAGNOSIS — E662 Morbid (severe) obesity with alveolar hypoventilation: Secondary | ICD-10-CM | POA: Diagnosis present

## 2023-09-29 DIAGNOSIS — I2721 Secondary pulmonary arterial hypertension: Secondary | ICD-10-CM | POA: Insufficient documentation

## 2023-09-29 DIAGNOSIS — G4733 Obstructive sleep apnea (adult) (pediatric): Secondary | ICD-10-CM | POA: Diagnosis not present

## 2023-09-29 DIAGNOSIS — J9612 Chronic respiratory failure with hypercapnia: Secondary | ICD-10-CM | POA: Insufficient documentation

## 2023-09-29 DIAGNOSIS — J386 Stenosis of larynx: Secondary | ICD-10-CM | POA: Diagnosis present

## 2023-09-29 DIAGNOSIS — Z930 Tracheostomy status: Secondary | ICD-10-CM | POA: Insufficient documentation

## 2023-09-29 DIAGNOSIS — I2723 Pulmonary hypertension due to lung diseases and hypoxia: Secondary | ICD-10-CM | POA: Diagnosis present

## 2023-09-29 MED ORDER — TRAMADOL HCL ER 100 MG PO TB24: 100.0000 mg | ORAL_TABLET | Freq: Every day | ORAL | 0 refills | Status: DC

## 2023-09-29 NOTE — Telephone Encounter (Signed)
 Pharmacy Patient Advocate Encounter   Received notification from CoverMyMeds that prior authorization for traMADol HCl ER 100MG  er tablets is required/requested.   Insurance verification completed.   The patient is insured through [facility] ADVANTAGE/RX ADVANCE .   Per test claim: PA required; PA submitted to above mentioned insurance via CoverMyMeds Key/confirmation #/EOC [id number] Status is pending

## 2023-09-29 NOTE — Progress Notes (Signed)
 Reason for visit Planned trach change  HPI 73 year old pt well known to me. Follow her for trach dependence for OHS,OSA on nocturnal ventilation. I am seeing her today for planned trach change AND post-hospital follow up from her recent hospital stay on 2/1 thru 2/4 for PNA.    Recent ENT and additional history  -Referred to ENT May 2023 for subglottis stenosis. subglottic mass removed  -direct laryngoscopy and airway evaluation in September 2023.  Large subglottic posteriorly-based granuloma which was eminating superiorly through the glottis, biopsied and send for permanent pathology; thick circumferential subglottic granulations tissue, which was removed with cupped forceps and coblator  Much improved subglottic airway following the procedure F/u 11/8: had been doing better initially post-op.  he visualized subglottis and proximal trachea are patent and the Shiley tracheostomy tube is visible in the subglottis the trachea is edematous and there does not appear to be much room around the tracheostomy device they recommended fenestrated trach which we placed.  This was subsequently discontinued as the patient did not tolerate it. She has surgery scheduled for 1/23 at [facility] w/ plan for Micro laryngectomy, removal of granulation, tracheal dilation and kenalog injection w/ hope that this will allow her to phonate and tolerate PMV again. We have agreed we do not want to work towards decannulation as she has been intol of BIPAP in past. While waiting I see she has had a f/u echo and c/w 2022 her EF has improved from 45-50% to 55-60%, there is mod LVH, RV fxn mildly reduced, she has severe elevation of PAS (88 mmHg).  1/23 s/p DL removal of granulation tissue and tracheal dilation. Tolerated well. Speech improved post-operatively.  2/1 thru 2/4 admitted to [facility] for PNA  Back today 3/28 for planned trach change   Review of Systems  HENT:  Positive for congestion.   All other systems reviewed and are  negative.   Exam General looks good no distress  HENT Trach unremarkable no dc or drainage. Stoma clean Pulm clear  Card rrr Abs soft Neuro baseline   Procedure Pre-medicated w/ inhaled lidocaine. The # 6 trach was removed. Site inspected and looked clean and unremarkable. A new trach was placed over obturator. Pt tolerated well. Placement verified via ETCO2    Active Problems:   Tracheostomy status (HCC)   Subglottic stenosis   OSA (obstructive sleep apnea)   Chronic respiratory failure with hypoxia and hypercapnia (HCC)   Obesity hypoventilation syndrome (HCC)   WHO group 3 pulmonary arterial hypertension (HCC)    Tracheostomy status (HCC) Overview:  Trach Brand: Shiley Size:  6.0 [id number] Style: Cuffed Secured by: Velcro  Last change today 3/28  prior change 2/28 at [facility]  Discussion   Plan Continue routine trach care ROV 4 weeks for planned tracheostomy change Continue nocturnal ventilation         My time 16 min   Simonne Martinet ACNP-BC [facility] Pulmonary/Critical Care Pager # [phone] OR # [phone] if no answer

## 2023-09-29 NOTE — Progress Notes (Signed)
 Tracheostomy Procedure Note  [name] [id number] Nov 01, 1950  Pre Procedure Tracheostomy Information  Trach Brand: Shiley Size:  #[id number] Style: Cuffed Secured by: Velcro   Procedure: Trach change/cleaning.    Post Procedure Tracheostomy Information  Trach Brand: Shiley Size:  #[id number] Style: Cuffed Secured by: Velcro   Post Procedure Evaluation:  ETCO2 positive color change from yellow to purple : Yes.   Patients current condition: stable Complications: No apparent complications Trach site exam: clean, dry, no drainage Wound care done: dry, saline soaked, sterile, and 4 x 4 gauze Patient did tolerate procedure well.   Education: IT sales professional given.  Prescription needs: None    Additional needs: PMV given.

## 2023-09-29 NOTE — Telephone Encounter (Signed)
 Sent 30 tablets tramadol 100 mg

## 2023-10-02 NOTE — Telephone Encounter (Signed)
 Pharmacy Patient Advocate Encounter  Received notification from [facility] ADVANTAGE/RX ADVANCE that Prior Authorization for traMADol HCl ER 100MG  er tablets  has been DENIED.  Full denial letter will be uploaded to the media tab. See denial reason below.   PA #/Case ID/Reference #: [id number]

## 2023-10-04 ENCOUNTER — Other Ambulatory Visit: Payer: Self-pay | Admitting: Family Medicine

## 2023-10-04 NOTE — Telephone Encounter (Signed)
 Patient has tried tramadol 50 mg previously noted on her medication list

## 2023-10-05 ENCOUNTER — Telehealth: Payer: Self-pay | Admitting: Family Medicine

## 2023-10-11 ENCOUNTER — Other Ambulatory Visit: Payer: Self-pay | Admitting: Family Medicine

## 2023-10-12 ENCOUNTER — Telehealth: Payer: Self-pay | Admitting: Adult Health

## 2023-10-12 ENCOUNTER — Ambulatory Visit: Payer: PPO | Admitting: Adult Health

## 2023-10-12 NOTE — Telephone Encounter (Signed)
 Pt cancelled appointment due to having diarrhea

## 2023-10-12 NOTE — Progress Notes (Deleted)
 No chief complaint on file.     ASSESSMENT AND PLAN  [name] is a 73 y.o. female   Dementia  MoCA ***/30 (prior 19/30)  Family history of memory loss, multiple vascular risk factor,  MR brain mild generalized atrophy and minimal chronic microvascular ischemic changes  History of seizure,  Continue Keppra 500 mg twice daily Presenting with staring spells, loss of muscle tone, fall,  Much improved since she was started on Keppra 500 twice daily, recent spell of sudden frozen, confusion in the middle of the walking Differentiation diagnosis include orthostatic blood pressure changes, versus recurrent partial seizure EEG 04/2023 no epileptiform discharges  Gait abnormality  Length-dependent sensory changes distal leg weakness,  Could due to diabetic peripheral neuropathy, with superimposed lumbar radiculopathy         DIAGNOSTIC DATA (LABS, IMAGING, TESTING) - I reviewed patient records, labs, notes, testing and imaging myself where available.   MRI brain 04/27/2023 IMPRESSION: This MRI of the brain without contrast shows the following: Mild generalized cortical atrophy, unchanged compared to the 2022 MRI Small number of T2/FLAIR hyperintense foci in the cerebral hemispheres consistent with minimal chronic microvascular ischemic change, unchanged compared to the 2022 MRI and normal for age. Left chronic sphenoid sinusitis No acute findings.       MEDICAL HISTORY:  Update 10/12/2023 JM: Patient returns for follow-up visit.        Consult visit 03/28/2023 Dr. Terrace Arabia: [name], is a 73 year old female, accompanied by her daughter seen in request by her primary care nurse practitioner from [place], [place], for evaluation of memory loss, gait abnormality, history of partial seizure, initial evaluation March 28, 2023  I reviewed and summarized the referring note. PMHx S/p tracheostomy July 2022, ventilator at night HLD HTN OSA,   Quit smoke in 1993 A fib,  CAD DM Lumbar decompressoin surgery in the past Seizure.   Seizure: Prior to 2011, she was having frequent spells of sudden loss of muscle tone, fall, about once a month, had 1 episode that witnessed by her beautician, describes staring spells, seizure-like activity, was seen by local neurologist Dr. Gerilyn Pilgrim, put on Keppra 500 mg twice daily, did very well, until 6 months ago, she was walking with a walker, suddenly froze in the middle, confused, did not know which direction to go, recovering couple minutes, she has been compliant with her Keppra  Memory loss: She is a retired Marketing executive for [employer], father had Alzheimer's at age 88s She lives with her husband and daughter at home, few years ago, she has developed gradual worsening memory loss, worsening confusion in the setting of chronic hypoxic respiratory failure, BiPAP intolerance, eventually had tracheostomy in 2022, now using oxygen during the day, ventilator at nighttime, since then, her mentation has much improved, she can organize her medications, carry on  normal conversation, MoCA is 19/30,   CT head in July 2022 showed generalized atrophy periventricular small vessel disease  Laboratory showed normal B12, slight elevation of TSH 5.4, on thyroid supplement  Gait abnormality: Multiple lumbar decompression surgery in the past, baseline chronic low back pain, knee pain, rely on her walker, length-dependent sensory changes, mild to moderate distal leg weakness on examination      PHYSICAL EXAM:   There were no vitals filed for this visit.   There is no height or weight on file to calculate BMI.  PHYSICAL EXAMNIATION:  Gen: NAD, conversant, well nourised, well groomed  Cardiovascular: Regular rate rhythm, no peripheral edema, warm, nontender. Eyes: Conjunctivae clear without exudates or hemorrhage Neck: Supple, no carotid bruits. Pulmonary: Clear to auscultation  bilaterally   NEUROLOGICAL EXAM:  MENTAL STATUS: Speech/cognition: Sitting in wheelchair, status post tracheostomy, awake, alert, oriented to history taking and casual conversation    03/28/2023    3:41 PM  Montreal Cognitive Assessment   Visuospatial/ Executive (0/5) 3  Naming (0/3) 2  Attention: Read list of digits (0/2) 2  Attention: Read list of letters (0/1) 0  Attention: Serial 7 subtraction starting at 100 (0/3) 3  Language: Repeat phrase (0/2) 2  Language : Fluency (0/1) 0  Abstraction (0/2) 1  Delayed Recall (0/5) 0  Orientation (0/6) 6  Total 19    CRANIAL NERVES: CN II: Visual fields are full to confrontation. Pupils are round equal and briskly reactive to light. CN III, IV, VI: extraocular movement are normal. No ptosis. CN V: Facial sensation is intact to light touch CN VII: Face is symmetric with normal eye closure  CN VIII: Hearing is normal to causal conversation. CN IX, X: Phonation is normal. CN XI: Head turning and shoulder shrug are intact  MOTOR: Mild to moderate bilateral ankle dorsiflexion weakness  REFLEXES: Reflexes are 1 and symmetric at the biceps, triceps, knees, and absent at ankles. Plantar responses are flexor.  SENSORY: Length-dependent decreased light touch, pinprick, vibratory sensation to distal shin level  COORDINATION: There is no trunk or limb dysmetria noted.  GAIT/STANCE: Push-up to get up from seated position, difficulty clear fluid from floor, unsteady,  REVIEW OF SYSTEMS:  Full 14 system review of systems performed and notable only for as above All other review of systems were negative.   ALLERGIES: Allergies  Allergen Reactions   Codeine Nausea And Vomiting and Other (See Comments)    Hallucinations   Dilaudid [Hydromorphone Hcl] Other (See Comments)    Syncope    Reglan [Metoclopramide] Other (See Comments)    Dyskinesia    Levsin [Hyoscyamine] Diarrhea    Caused worse diarrhea    HOME MEDICATIONS: Current  Outpatient Medications  Medication Sig Dispense Refill   acetaminophen (TYLENOL) 650 MG CR tablet Take 1,300 mg by mouth 2 (two) times daily.     atorvastatin (LIPITOR) 40 MG tablet Take 1 tablet (40 mg total) by mouth daily. 90 tablet 3   busPIRone (BUSPAR) 30 MG tablet Take 1 tablet (30 mg total) by mouth in the morning and at bedtime. 60 tablet 3   Cholecalciferol (VITAMIN D-3 PO) Take 1 tablet by mouth daily in the afternoon.     Continuous Glucose Sensor (FREESTYLE LIBRE 2 SENSOR) MISC FOR CONTINUOUS GLUCOSE MONITORING. 2 each 11   Continuous Glucose Sensor (FREESTYLE LIBRE 2 SENSOR) MISC Use daily 1 each 3   Continuous Glucose Sensor (FREESTYLE LIBRE 2 SENSOR) MISC APPLY ONE SENSOR TO THE SKIN EVERY 14 DAYS AS DIRECTED TO CONTINUOUSLY MONITOR GLUCOSE LEVELS 1 each 2   dicyclomine (BENTYL) 10 MG capsule TAKE 1 CAPSULE BY MOUTH UP TO 4 TIMES DAILY BEFORE MEALS AND AT BEDTIME (STOP IF CONSTIPATION). 120 capsule 3   donepezil (ARICEPT) 5 MG tablet TAKE 1 TABLET BY MOUTH AT BEDTIME FOR dementia 90 tablet 3   ELIQUIS 5 MG TABS tablet TAKE 1 TABLET BY MOUTH TWICE DAILY AS A BLOOD THINNER 180 tablet 3   furosemide (LASIX) 20 MG tablet Take 1 tablet (20 mg total) by mouth daily. 100 tablet 3   gabapentin (NEURONTIN) 100 MG capsule TAKE ONE  CAPSULE BY MOUTH THREE TIMES DAILY FOR NEUROPATHIC PAIN 270 capsule 3   insulin aspart (NOVOLOG FLEXPEN) 100 UNIT/ML FlexPen Inject 0-8 Units into the skin 3 (three) times daily with meals. 15 mL 4   insulin glargine (LANTUS SOLOSTAR) 100 UNIT/ML Solostar Pen Inject 20 Units into the skin at bedtime. 15 mL 4   ipratropium-albuterol (DUONEB) 0.5-2.5 (3) MG/3ML SOLN Take 3 mLs by nebulization every 4 (four) hours as needed (Asthma).     JARDIANCE 10 MG TABS tablet TAKE ONE TABLET BY MOUTH EVERY DAY. 30 tablet 2   levETIRAcetam (KEPPRA) 500 MG tablet TAKE 1 TABLET BY MOUTH TWICE DAILY FOR SEIZURE PREVENTION 180 tablet 3   levothyroxine (SYNTHROID) 150 MCG tablet Take  1 tablet (150 mcg total) by mouth daily before breakfast. 30 tablet 1   loperamide (IMODIUM) 2 MG capsule Take 2 mg by mouth every 8 (eight) hours as needed for diarrhea or loose stools.     LORazepam (ATIVAN) 1 MG tablet Take 1 to 1 1/2 tablets once daily as needed for anxiety or to aid with sleep 30 tablet 0   metoprolol tartrate (LOPRESSOR) 25 MG tablet TAKE 1 TABLET BY MOUTH TWICE DAILY FOR HIGH BLOOD PRESSURE OR HEART RATE CONTROL 180 tablet 3   nitroGLYCERIN (NITROSTAT) 0.4 MG SL tablet Place 0.4 mg under the tongue every 5 (five) minutes as needed for chest pain.     pantoprazole (PROTONIX) 40 MG tablet TAKE 1 TABLET BY MOUTH TWICE DAILY FOR acid reflux 180 tablet 3   sertraline (ZOLOFT) 50 MG tablet Take 1 tablet (50 mg total) by mouth daily. 30 tablet 3   traMADol (ULTRAM-ER) 100 MG 24 hr tablet Take 1 tablet (100 mg total) by mouth daily. 30 tablet 0   No current facility-administered medications for this visit.    PAST MEDICAL HISTORY: Past Medical History:  Diagnosis Date   Abnormal pulmonary function test    Anemia    H/H of 10/30 with a normal MCV in 12/09   Anxiety    Arthritis    Barrett's esophagus    Diagnosed 1995. Last EGD 2016-NO BARRETT'S.    Chest pain    a. 2022 Cath: nl cors; b. 2008 Neg myoview; c. 01/2014 Cath: Nl cors. EF 40%; c. 08/2020 NSTEMI/[facility]): nl cors.   Chronic anticoagulation    Chronic combined systolic and diastolic CHF (congestive heart failure) (HCC)    a. 10/2014 Echo: EF40%; b. 01/2015 Echo: EF 55-60%; c. 11/2020 Echo: EF 55-60%, mod LVH, sev BAE, mild MR, mod TR. PASP ; d. 01/2021 Echo: EF 45-50%, glob HK, mod conc LVH, mod reduced RV fxn, RVSP 60.19mmHg, Mod TR, mild to mod MR, sev dil LA.   Chronic LBP    Surgical intervention in 1996   Diabetes mellitus, type 2 (HCC)    Insulin therapy; exacerbated by prednisone   Gastroparesis    99% retention 05/2008 on GES   GERD (gastroesophageal reflux disease)    Heart attack (HCC) 08/13/2020    Hiatal hernia    Hyperlipidemia    Hypertension    Hypothyroid    IBS (irritable bowel syndrome)    Obesity    OSA on CPAP    had CPAP and cannot tolerate.   Paroxysmal atrial fibrillation (HCC)    a.CHA2DS2VASc = 6-->eliquis. Also on Amio.   Pulmonary hypertension (HCC) 01/2015   a. 01/2015 RHC: Predominantly pulmonary venous hypertension but may be component of PAH (PA mean 52, PCWP 31); b. 01/2021 Echo:  RVSP 60.88mmHg w/ mod reduced RV fxn.   Seizures (HCC)    last seizure was 2 years ago; on keppra for this; unknown etiology   Syncope    a. Admitted 05/2009; magnetic resonance imagin/ MRA - negative; etiology thought to be orthostasis secondary to drugs and dehydration. b. Syncope 02/2015 also felt 2/2 dehydration.    PAST SURGICAL HISTORY: Past Surgical History:  Procedure Laterality Date   BACK SURGERY  1996   BIOPSY N/A 11/08/2013   Procedure: BIOPSY  / Tissue sampling / ulcers present in small intestine;  Surgeon: West Bali, MD;  [facility];  Service: Endoscopy;  Laterality: N/A;   CARDIAC CATHETERIZATION  2002   CARDIAC CATHETERIZATION N/A 01/26/2015   Procedure: Right Heart Cath;  Surgeon: Laurey Morale, MD;  Location: [facility] INVASIVE CV LAB;  Service: Cardiovascular;  Laterality: N/A;   CARDIOVASCULAR STRESS TEST  2008   Stress nuclear study   CARDIOVERSION N/A 03/06/2015   Procedure: CARDIOVERSION;  Surgeon: Laurey Morale, MD;  Location: [name] ENDOSCOPY;  Service: Cardiovascular;  Laterality: N/A;   CARPAL TUNNEL RELEASE  1994   CATARACT EXTRACTION W/PHACO Right 01/10/2022   Procedure: CATARACT EXTRACTION PHACO AND INTRAOCULAR LENS PLACEMENT (IOC);  Surgeon: [name], MD;  Location: [facility] ORS;  Service: Ophthalmology;  Laterality: Right;  CDE: 21.51   CATARACT EXTRACTION W/PHACO Left 01/24/2022   Procedure: CATARACT EXTRACTION PHACO AND INTRAOCULAR LENS PLACEMENT (IOC);  Surgeon: [name], MD;  Location: [facility];  Service: Ophthalmology;   Laterality: Left;  CDE: 17.13   COLONOSCOPY  11/2011   Dr. Darrick Penna: Internal hemorrhoids, mild diverticulosis. Random colon biopsies negative.   COLONOSCOPY N/A 11/08/2013   SLF: Normal mucosa in the terminal ileum/The colon IS redundant/  Moderate diverticulosis throughout the entire colon. ileum bx benign. colon bx benign   CRYOTHERAPY  01/23/2021   Procedure: CRYOTHERAPY;  Surgeon: Lorin Glass, MD;  Location: [facility] ENDOSCOPY;  Service: Pulmonary;;   CRYOTHERAPY  01/24/2021   Procedure: Cloyde Reams;  Surgeon: Lorin Glass, MD;  [facility] END[facility];  Service: Pulmonary;;   CRYOTHERAPY  01/26/2021   Procedure: Cloyde Reams;  Surgeon: Lupita Leash, MD;  Location: [facility] ENDOSCOPY;  Service: Cardiopulmonary;;   ESOPHAGOGASTRODUODENOSCOPY  2008   Barrett's without dysplasia. esphagus dilated. antral erosions, h.pylori serologies negative.   ESOPHAGOGASTRODUODENOSCOPY  11/2011   Dr. Darrick Penna: Barrett's esophagus, mild gastritis, diverticulum in the second portion of the duodenum repeat EGD 3 years. Small bowel biopsies negative. Gastric biopsy show reactive gastropathy but no H. pylori. Esophageal biopsies consistent with GERD. Next EGD 11/2014   ESOPHAGOGASTRODUODENOSCOPY N/A 11/21/2014   BMW:UXLK non-erosive gastritis/irregular z-line   ESOPHAGOGASTRODUODENOSCOPY  03/2022   FOREIGN BODY REMOVAL  01/23/2021   Procedure: FOREIGN BODY REMOVAL;  Surgeon: Lorin Glass, MD;  Location: [facility] ENDOSCOPY;  Service: Pulmonary;;   FOREIGN BODY REMOVAL  01/24/2021   Procedure: FOREIGN BODY REMOVAL;  Surgeon: Lorin Glass, MD;  Location: [facility] ENDOSCOPY;  Service: Pulmonary;;   GIVENS CAPSULE STUDY  12/07/2011   Proximal small bowel, rare AVM. Distal small bowel, multiple ulcers noted   GIVENS CAPSULE STUDY N/A 09/27/2013   Distal small bowel ulcers extending to TI.   GIVENS CAPSULE STUDY N/A 10/10/2013   Procedure: GIVENS CAPSULE STUDY;  Surgeon: West Bali, MD;  [facility];   Service: Endoscopy;  Laterality: N/A;  7:30   HEMOSTASIS CONTROL  01/24/2021   Procedure: HEMOSTASIS CONTROL;  Surgeon: Lorin Glass, MD;  Location: [facility] ENDOSCOPY;  Service: Pulmonary;;   HEMOSTASIS CONTROL  01/26/2021   Procedure: HEMOSTASIS CONTROL;  Surgeon: Lupita Leash, MD;  Location: [facility] ENDOSCOPY;  Service: Cardiopulmonary;;   IR GASTROSTOMY TUBE MOD SED  01/11/2021   IR GASTROSTOMY TUBE REMOVAL  04/13/2021   IR KYPHO THORACIC WITH BONE BIOPSY  02/09/2018   KNEE ARTHROSCOPY WITH MEDIAL MENISECTOMY Right 06/09/2016   Procedure: KNEE ARTHROSCOPY WITH MEDIAL MENISECTOMY;  Surgeon: Vickki Hearing, MD;  Location: [facility];  Service: Orthopedics;  Laterality: Right;  medial and lateral menisectomy   LAMINECTOMY  1995   L4-L5   LAPAROSCOPIC CHOLECYSTECTOMY  1990s   LEFT HEART CATHETERIZATION WITH CORONARY ANGIOGRAM  01/10/2014   Procedure: LEFT HEART CATHETERIZATION WITH CORONARY ANGIOGRAM;  Surgeon: Lesleigh Noe, MD;  Location: [facility] CATH LAB;  Service: Cardiovascular;;   MICROLARYNGOSCOPY WITH LASER AND BALLOON DILATION N/A 11/17/2021   Procedure: MICROLARYNGOSCOPY WITH CO2 LASER, SUBGLOTTIC LARYNGEAL MASS BIOSPY;  Surgeon: Serena Colonel, MD;  Location: [facility] OR;  Service: ENT;  Laterality: N/A;   RIGHT HEART CATHETERIZATION N/A 01/10/2014   Procedure: RIGHT HEART CATH;  Surgeon: Lesleigh Noe, MD;  Location: [facility] CATH LAB;  Service: Cardiovascular;  Laterality: N/A;   TOTAL ABDOMINAL HYSTERECTOMY  1999   TRACHEOSTOMY REVISION N/A 07/18/2021   Procedure: TRACHEOSTOMY REVISION;  Surgeon: Suzanna Obey, MD;  Location: [facility] OR;  Service: ENT;  Laterality: N/A;   TRACHEOSTOMY TUBE PLACEMENT N/A 09/01/2017   Procedure: TRACHEOSTOMY;  Surgeon: Drema Halon, MD;  Location: [facility] OR;  Service: ENT;  Laterality: N/A;   VIDEO BRONCHOSCOPY Bilateral 01/23/2021   Procedure: VIDEO BRONCHOSCOPY WITHOUT FLUORO;  Surgeon: Lorin Glass, MD;  Location: [facility] ENDOSCOPY;  Service: Pulmonary;   Laterality: Bilateral;   VIDEO BRONCHOSCOPY Bilateral 01/24/2021   Procedure: VIDEO BRONCHOSCOPY WITHOUT FLUORO;  Surgeon: Lorin Glass, MD;  Location: [facility] ENDOSCOPY;  Service: Pulmonary;  Laterality: Bilateral;   VIDEO BRONCHOSCOPY N/A 01/26/2021   Procedure: VIDEO BRONCHOSCOPY WITHOUT FLUORO;  Surgeon: Lupita Leash, MD;  Location: [facility] ENDOSCOPY;  Service: Cardiopulmonary;  Laterality: N/A;    FAMILY HISTORY: Family History  Problem Relation Age of Onset   Hypertension Mother    Alzheimer's disease Mother    Stroke Mother    Heart attack Mother    Breast cancer Sister    Hypertension Other    Heart disease Neg Hx    Colon cancer Neg Hx    Liver disease Neg Hx     SOCIAL HISTORY: Social History   Socioeconomic History   Marital status: Married    Spouse name: Not on file   Number of children: Not on file   Years of education: Not on file   Highest education level: Not on file  Occupational History   Occupation: Passenger transport manager at [employer]    Employer: [employer]    Comment: disabled    Employer: RETIRED  Tobacco Use   Smoking status: Former    Current packs/day: 0.00    Average packs/day: 0.3 packs/day for 27.3 years (6.8 ttl pk-yrs)    Types: Cigarettes    Start date: 02/26/1966    Quit date: 06/30/1993    Years since quitting: 30.3   Smokeless tobacco: Never   Tobacco comments:    quit in 1984  Vaping Use   Vaping status: Never Used  Substance and Sexual Activity   Alcohol use: Not Currently    Comment: Occasional alcohol 30 years ago (09/02/21)   Drug use: No   Sexual activity: Yes  Birth control/protection: None, Surgical  Other Topics Concern   Not on file  Social History Narrative   Sedentary   4 children, "blended family"   Social Drivers of Corporate investment banker Strain: Not on file  Food Insecurity: No Food Insecurity (08/06/2023)   Hunger Vital Sign    Worried About Running Out of Food in the Last Year: Never true    Ran Out of Food in the  Last Year: Never true  Transportation Needs: No Transportation Needs (08/06/2023)   PRAPARE - Administrator, Civil Service (Medical): No    Lack of Transportation (Non-Medical): No  Physical Activity: Not on file  Stress: Not on file  Social Connections: Not on file  Intimate Partner Violence: Not At Risk (08/06/2023)   Humiliation, Afraid, Rape, and Kick questionnaire    Fear of Current or Ex-Partner: No    Emotionally Abused: No    Physically Abused: No    Sexually Abused: No        I spent *** minutes of face-to-face and non-face-to-face time with patient.  This included previsit chart review, lab review, study review, order entry, electronic health record documentation, patient education and discussion regarding above diagnoses and treatment plan and answered all other questions to patient's satisfaction  Ihor Austin, [facility]  [facility] [address] [place], [place] [address]  Phone [phone] Fax [phone] Note: This document was prepared with digital dictation and possible smart phrase technology. Any transcriptional errors that result from this process are unintentional.

## 2023-10-17 ENCOUNTER — Telehealth: Payer: Self-pay

## 2023-10-17 NOTE — Telephone Encounter (Signed)
 Copied from CRM [id number]. Topic: General - Other >> Oct 16, 2023  2:49 PM Santiya F wrote: Reason for CRM: [facility] is calling in because they faxed over an order for a plan of care and skilled nursing for patient and haven't heard anything back. Order number: [id number].

## 2023-10-24 ENCOUNTER — Telehealth: Payer: Self-pay | Admitting: Family Medicine

## 2023-10-24 ENCOUNTER — Other Ambulatory Visit: Payer: Self-pay | Admitting: Family Medicine

## 2023-10-24 NOTE — Telephone Encounter (Signed)
 Please advise was fax received?  Copied from CRM [id number]. Topic: General - Other >> Oct 24, 2023  4:04 PM Elle L wrote: Reason for CRM: Caitlin with [facility], [phone], was following up on their fax request.

## 2023-10-25 LAB — COLOGUARD: COLOGUARD: NEGATIVE

## 2023-10-26 ENCOUNTER — Ambulatory Visit (HOSPITAL_COMMUNITY)
Admission: RE | Admit: 2023-10-26 | Discharge: 2023-10-26 | Disposition: A | Discharge status: Home / Self Care | Source: Ambulatory Visit | Attending: Acute Care | Admitting: Acute Care

## 2023-10-26 DIAGNOSIS — R410 Disorientation, unspecified: Secondary | ICD-10-CM | POA: Insufficient documentation

## 2023-10-26 DIAGNOSIS — J9611 Chronic respiratory failure with hypoxia: Secondary | ICD-10-CM | POA: Diagnosis present

## 2023-10-26 DIAGNOSIS — J386 Stenosis of larynx: Secondary | ICD-10-CM | POA: Diagnosis present

## 2023-10-26 DIAGNOSIS — Z930 Tracheostomy status: Secondary | ICD-10-CM | POA: Diagnosis present

## 2023-10-26 DIAGNOSIS — E662 Morbid (severe) obesity with alveolar hypoventilation: Secondary | ICD-10-CM | POA: Diagnosis present

## 2023-10-26 DIAGNOSIS — I2723 Pulmonary hypertension due to lung diseases and hypoxia: Secondary | ICD-10-CM | POA: Diagnosis present

## 2023-10-26 DIAGNOSIS — Z9911 Dependence on respirator [ventilator] status: Secondary | ICD-10-CM

## 2023-10-26 DIAGNOSIS — G4733 Obstructive sleep apnea (adult) (pediatric): Secondary | ICD-10-CM | POA: Diagnosis present

## 2023-10-26 NOTE — Progress Notes (Addendum)
 Tracheostomy Procedure Note  [name] [id number] [birth year]  Pre Procedure Tracheostomy Information Lidocaine  neb given prior to trach change  Trach Brand: Shiley Size:  6.0 6CN75H Style: Cuffed Secured by: Velcro   Procedure: Trach Cleaning and Trach Change    Post Procedure Tracheostomy Information  Trach Brand: Shiley Size:  6.0 6CN75H Style: Cuffed Secured by: Velcro   Post Procedure Evaluation:  ETCO2 positive color change from yellow to purple : Yes.   Vital signsVSs Patients current condition: stable Complications: No apparent complications Trach site exam: clean, dry Wound care done: 4 x 4 gauze drain Patient did tolerate procedure well.   Education: none  Prescription needs: none    Additional needs: none

## 2023-10-26 NOTE — Telephone Encounter (Signed)
 Request just received yesterday, has been placed in providers folder. Awaiting signature.

## 2023-10-26 NOTE — Progress Notes (Signed)
 Reason for visit Planned trach change  HPI 73 year old pt well known to me. Follow her for trach dependence for OHS,OSA on nocturnal ventilation. I am seeing her today for planned trach change. Last change was 3/28   Recent ENT and additional history  -Referred to ENT May 2023 for subglottis stenosis. subglottic mass removed  -direct laryngoscopy and airway evaluation in September 2023.  Large subglottic posteriorly-based granuloma which was eminating superiorly through the glottis, biopsied and send for permanent pathology; thick circumferential subglottic granulations tissue, which was removed with cupped forceps and coblator  Much improved subglottic airway following the procedure F/u 11/8: had been doing better initially post-op.  he visualized subglottis and proximal trachea are patent and the Shiley tracheostomy tube is visible in the subglottis the trachea is edematous and there does not appear to be much room around the tracheostomy device they recommended fenestrated trach which we placed.  This was subsequently discontinued as the patient did not tolerate it. She has surgery scheduled for 1/23 at [facility] w/ plan for Micro laryngectomy, removal of granulation, tracheal dilation and kenalog injection w/ hope that this will allow her to phonate and tolerate PMV again. We have agreed we do not want to work towards decannulation as she has been intol of BIPAP in past. While waiting I see she has had a f/u echo and c/w 2022 her EF has improved from 45-50% to 55-60%, there is mod LVH, RV fxn mildly reduced, she has severe elevation of PAS (88 mmHg).  1/23 s/p DL removal of granulation tissue and tracheal dilation. Tolerated well. Speech improved post-operatively.  2/1 thru 2/4 admitted to [facility] for PNA   Review of Systems  Constitutional:  Positive for chills. Negative for fever.       Isolated episode of chills couple days ago   HENT:  Positive for congestion.   Eyes: Negative.   Respiratory:   Positive for cough. Negative for sputum production.        She coughs w/ PMV, once she coughs reflux worse then starts feeling nauseated   Cardiovascular: Negative.   Gastrointestinal:  Positive for nausea.  Genitourinary: Negative.   Musculoskeletal: Negative.   Neurological:        Daughter reports isolated night time episodes of confusion (mixing up day and night)   All other systems reviewed and are negative.   Exam General up in chair no distress  HENT ncat the #6 trach is unremarkable. The stoma site is clean. She is able to voice one word phrase at times but does not tolerate PMV as well as we had hoped Pulm clear  Card rrr Abd soft Ext warm no edema  Neuro intact   Procedure Pre-medicated w/ inhaled lidocaine . The # 6 trach was removed. Site inspected and looked clean and unremarkable. A new trach was placed over obturator. Placement verified via ETCO2  Tolerated well   Principal Problem:   Tracheostomy status (HCC) Active Problems:   Subglottic stenosis   OSA (obstructive sleep apnea)   Chronic respiratory failure with hypoxia and hypercapnia (HCC)   Dependence on home ventilator (HCC)   Obesity hypoventilation syndrome (HCC)   WHO group 3 pulmonary arterial hypertension (HCC)    Tracheostomy status (HCC) Overview:  Trach Brand: Shiley Size:  6.0 [id number] Style: Cuffed Secured by: Velcro  Last change today 4/24  prior change 3/28 at [facility]  Discussion Doing well  Trach stable. Not candidate for removal d/t need for nocturnal ventilation and intolerance of CPAP  ALSO on-going issues w/ SGS  Plan Continue routine trach care ROV 4 weeks for planned tracheostomy change Continue nocturnal ventilation    Confusion Overview: Isolated episode at night (once)  No fevers no chills. No new cough or dysuria  Plan Cont to monitor  Seems isolated have advised her daughter to monitor           My time 14 min   Hadley Leu ACNP-BC [facility]  Pulmonary/Critical Care Pager # [phone] OR # [phone] if no answer

## 2023-10-27 ENCOUNTER — Other Ambulatory Visit: Payer: Self-pay | Admitting: Family Medicine

## 2023-10-27 DIAGNOSIS — R410 Disorientation, unspecified: Secondary | ICD-10-CM | POA: Insufficient documentation

## 2023-10-30 ENCOUNTER — Telehealth: Payer: Self-pay

## 2023-10-30 NOTE — Telephone Encounter (Signed)
 [name] following up says she is needing this plan of care

## 2023-10-30 NOTE — Telephone Encounter (Signed)
 Copied from CRM [id number]. Topic: General - Other >> Oct 24, 2023  4:04 PM Elle L wrote: Reason for CRM: Caitlin with [facility], [phone], was following up on their fax request.

## 2023-10-31 ENCOUNTER — Other Ambulatory Visit: Payer: Self-pay | Admitting: Family Medicine

## 2023-10-31 NOTE — Telephone Encounter (Signed)
 Signed forms have not been returned from provider. No new news to give at this time.

## 2023-10-31 NOTE — Telephone Encounter (Signed)
 Form is in providers folder waiting for signature and return.

## 2023-11-01 ENCOUNTER — Other Ambulatory Visit: Payer: Self-pay

## 2023-11-01 ENCOUNTER — Other Ambulatory Visit: Payer: Self-pay | Admitting: Family Medicine

## 2023-11-01 DIAGNOSIS — D500 Iron deficiency anemia secondary to blood loss (chronic): Secondary | ICD-10-CM

## 2023-11-01 DIAGNOSIS — D508 Other iron deficiency anemias: Secondary | ICD-10-CM

## 2023-11-01 DIAGNOSIS — D696 Thrombocytopenia, unspecified: Secondary | ICD-10-CM

## 2023-11-02 ENCOUNTER — Inpatient Hospital Stay: Payer: PPO | Attending: Hematology

## 2023-11-02 DIAGNOSIS — D500 Iron deficiency anemia secondary to blood loss (chronic): Secondary | ICD-10-CM

## 2023-11-02 DIAGNOSIS — D508 Other iron deficiency anemias: Secondary | ICD-10-CM

## 2023-11-02 DIAGNOSIS — D509 Iron deficiency anemia, unspecified: Secondary | ICD-10-CM | POA: Insufficient documentation

## 2023-11-02 DIAGNOSIS — D696 Thrombocytopenia, unspecified: Secondary | ICD-10-CM | POA: Insufficient documentation

## 2023-11-02 DIAGNOSIS — Z79899 Other long term (current) drug therapy: Secondary | ICD-10-CM | POA: Insufficient documentation

## 2023-11-02 LAB — IRON AND TIBC
Iron: 42 ug/dL (ref 28–170)
Saturation Ratios: 22 % (ref 10.4–31.8)
TIBC: 194 ug/dL — ABNORMAL LOW (ref 250–450)
UIBC: 152 ug/dL

## 2023-11-02 LAB — COMPREHENSIVE METABOLIC PANEL WITH GFR
ALT: 11 U/L (ref 0–44)
AST: 14 U/L — ABNORMAL LOW (ref 15–41)
Albumin: 3.3 g/dL — ABNORMAL LOW (ref 3.5–5.0)
Alkaline Phosphatase: 106 U/L (ref 38–126)
Anion gap: 10 (ref 5–15)
BUN: 20 mg/dL (ref 8–23)
CO2: 18 mmol/L — ABNORMAL LOW (ref 22–32)
Calcium: 9.1 mg/dL (ref 8.9–10.3)
Chloride: 112 mmol/L — ABNORMAL HIGH (ref 98–111)
Creatinine, Ser: 1.08 mg/dL — ABNORMAL HIGH (ref 0.44–1.00)
GFR, Estimated: 54 mL/min — ABNORMAL LOW (ref >60)
Glucose, Bld: 150 mg/dL — ABNORMAL HIGH (ref 70–99)
Potassium: 3.5 mmol/L (ref 3.5–5.1)
Sodium: 140 mmol/L (ref 135–145)
Total Bilirubin: 0.6 mg/dL (ref 0.0–1.2)
Total Protein: 7.1 g/dL (ref 6.5–8.1)

## 2023-11-02 LAB — CBC WITH DIFFERENTIAL/PLATELET
Abs Immature Granulocytes: 0.08 10*3/uL — ABNORMAL HIGH (ref 0.00–0.07)
Basophils Absolute: 0 10*3/uL (ref 0.0–0.1)
Basophils Relative: 1 %
Eosinophils Absolute: 0.1 10*3/uL (ref 0.0–0.5)
Eosinophils Relative: 1 %
HCT: 38.6 % (ref 36.0–46.0)
Hemoglobin: 11.8 g/dL — ABNORMAL LOW (ref 12.0–15.0)
Immature Granulocytes: 1 %
Lymphocytes Relative: 23 %
Lymphs Abs: 1.7 10*3/uL (ref 0.7–4.0)
MCH: 29.9 pg (ref 26.0–34.0)
MCHC: 30.6 g/dL (ref 30.0–36.0)
MCV: 97.7 fL (ref 80.0–100.0)
Monocytes Absolute: 0.7 10*3/uL (ref 0.1–1.0)
Monocytes Relative: 10 %
Neutro Abs: 4.9 10*3/uL (ref 1.7–7.7)
Neutrophils Relative %: 64 %
Platelets: 229 10*3/uL (ref 150–400)
RBC: 3.95 MIL/uL (ref 3.87–5.11)
RDW: 14.8 % (ref 11.5–15.5)
WBC: 7.6 10*3/uL (ref 4.0–10.5)
nRBC: 0 % (ref 0.0–0.2)

## 2023-11-02 LAB — FERRITIN: Ferritin: 285 ng/mL (ref 11–307)

## 2023-11-02 LAB — VITAMIN B12: Vitamin B-12: 846 pg/mL (ref 180–914)

## 2023-11-05 ENCOUNTER — Other Ambulatory Visit: Payer: Self-pay | Admitting: Acute Care

## 2023-11-05 MED ORDER — CEPHALEXIN 500 MG PO CAPS: 500.0000 mg | ORAL_CAPSULE | Freq: Three times a day (TID) | ORAL | 0 refills | Status: AC

## 2023-11-05 NOTE — Progress Notes (Signed)
 Called by Tyra Galley (daughter). Pt having pain at trach site. No swelling, no sig redness. No evidence of airway obstruction (no increased voice w/out capping. She is tender to touch at trach stoma site.  I am concerned about possible local trach site infection  Plan Will start Keflex  Daughter will call me Tuesday and report progress If not better will bring her in, examine, exchange trach and decide if needs imaging, if feeling better will complete abx course If pain worse or increased concern before Tuesday I have advised them to go to ER as would need imaging and eval either by our team OR ENT   Hadley Leu ACNP-BC [facility] Pulmonary/Critical Care Pager # [phone] OR # [phone] if no answer

## 2023-11-06 ENCOUNTER — Encounter: Payer: Self-pay | Admitting: Gastroenterology

## 2023-11-06 ENCOUNTER — Telehealth: Payer: Self-pay | Admitting: Family Medicine

## 2023-11-06 LAB — METHYLMALONIC ACID, SERUM: Methylmalonic Acid, Quantitative: 162 nmol/L (ref 0–378)

## 2023-11-06 NOTE — Telephone Encounter (Signed)
 Thersia Flax called again from [facility] needs this signed ASAP it is an outstanding order.  Call [facility] with any questions [phone].

## 2023-11-06 NOTE — Telephone Encounter (Signed)
 FYI, received forms by fax, forms put into provider box.

## 2023-11-06 NOTE — Telephone Encounter (Signed)
 [name] from [facility] home health to see if we received a order speech therapy, was faxed to our office on April 30th.  [phone] ext [phone].   Asked to refax the form.

## 2023-11-06 NOTE — Progress Notes (Signed)
 Referring Provider: Del Orbe Polanco, Ilian* Primary Care Physician:  Rosanna Comment, FNP Primary GI Physician: Dr. Mordechai April  Chief Complaint  Patient presents with   Follow-up    Still having issues with diarrhea    HPI:   [name] is a 73 y.o. female with history of CHF, pulmonary hypertension, atrial fibrillation on Eliquis , restrictive lung disease, hospitalized in June 2022 with respiratory failure and required permanent tracheostomy, IDA previously following with hematology and receiving IV iron  due to oral iron  intolerance, chronic lower abdominal pain worsened postprandially with negative mesenteric US  in 2021, gastroparesis, GERD, IBS with alternating constipation and diarrhea,  colitis in October 2024, pending today for follow-up of loose stools.   Last seen in the office 05/19/2023.  Reported feeling overall improved since hospital discharge.  Stools sometimes loose, but not watery.  Felt bowels were back to baseline for most part.  She was taking dicyclomine  4 times daily and Imodium  as needed.  Had 2 occurrences of low-volume blood in her stool week prior to her office visit.  Denied abdominal pain.  Occasional nausea that improved with Pepcid  and GERD overall controlled with pantoprazole  twice daily.  Recommended continuing current medications.  Advised add Benefiber to help with stool consistency/form.  Discussed possibility of colonoscopy to follow-up on colitis and for rectal bleeding, but patient preferred to hold off and monitor.  Felt this was reasonable as patient is high risk for procedures.  Labs dated 05/24/2023.  Hemoglobin was 10.8 which was down from 11.6 on 10/30.  Again discussed possibility colonoscopy but patient preferred to hold off on this.  Recommended seeing hematology for IV iron . However in December 2021, patient was found to have hemoglobin of 11.6 without iron  deficiency.   In the interim:  Patient hospitalized in February 2025 for  community-acquired pneumonia. Patient completed Cologuard per PCP in April 2025 which was negative.   Today:  Diarrhea mainly in the morning. If eating yogurt, may go often, but can still go often without yogurt. Sometimes uncontrollable. Very loose/ mushy. Bms usually after eating.  On average, has 3 bowel movements per day.  Can be 5 or 6-second 36-hour period.   Notes lower abdominal cramping if eating ice cream. Rare nausea.  No vomiting.  3 benefiber capsules per day.  Dicyclomine  4 times a day Imodium  prn 1-2 per spell, about 4 times per week.   Also currently on Keflex  for her throat.  Overall, feels diarrhea has worsened since I saw her last time. Aside from antibiotics and hospitalizations recently, Zoloft  was also increased.   Prior endoscopic evaluation:  EGD in May 2016 due to anemia and history of Barrett's without evidence of Barrett's. Last colonoscopy in May 2015 with redundant colon, moderate diverticulosis throughout the entire colon.  Capsule in April 2015 with idiopathic small bowel ulcers that have been present since 2013.  *Had been taking Aleve.   CT A/P with contrast 04/29/2023 showed long segment colonic wall thickening involving the ascending and proximal transverse colon as well as distal sigmoid colon and rectum consistent with nonspecific colitis.  C. difficile and GI pathogen panel were negative.  No reports of syncope though she did have soft blood pressures on admission.  She was treated with ceftriaxone  and Flagyl  with improvement in abdominal pain and diarrhea.  Recommended holding off on endoscopic evaluation unless worsening diarrhea or rectal bleeding.    Past Medical History:  Diagnosis Date   Abnormal pulmonary function test  Anemia    H/H of 10/30 with a normal MCV in 12/09   Anxiety    Arthritis    Atrial fibrillation (HCC)    Barrett's esophagus    Diagnosed 1995. Last EGD 2016-NO BARRETT'S.    Chest pain    a. 2022 Cath: nl cors; b.  2008 Neg myoview; c. 01/2014 Cath: Nl cors. EF 40%; c. 08/2020 NSTEMI/Cath [facility]): nl cors.   Chronic anticoagulation    Chronic combined systolic and diastolic CHF (congestive heart failure) (HCC)    a. 10/2014 Echo: EF40%; b. 01/2015 Echo: EF 55-60%; c. 11/2020 Echo: EF 55-60%, mod LVH, sev BAE, mild MR, mod TR. PASP ; d. 01/2021 Echo: EF 45-50%, glob HK, mod conc LVH, mod reduced RV fxn, RVSP 60.43mmHg, Mod TR, mild to mod MR, sev dil LA.   Chronic LBP    Surgical intervention in 1996   Diabetes mellitus, type 2 (HCC)    Insulin  therapy; exacerbated by prednisone    Gastroparesis    99% retention 05/2008 on GES   GERD (gastroesophageal reflux disease)    Heart attack (HCC) 08/13/2020   Hiatal hernia    Hyperlipidemia    Hypertension    Hypothyroid    IBS (irritable bowel syndrome)    Obesity    OSA on CPAP    had CPAP and cannot tolerate.   Paroxysmal atrial fibrillation (HCC)    a.CHA2DS2VASc = 6-->eliquis . Also on Amio.   Pulmonary hypertension (HCC) 01/2015   a. 01/2015 RHC: Predominantly pulmonary venous hypertension but may be component of PAH (PA mean 52, PCWP 31); b. 01/2021 Echo: RVSP 60.56mmHg w/ mod reduced RV fxn.   Seizures (HCC)    last seizure was 2 years ago; on keppra  for this; unknown etiology   Syncope    a. Admitted 05/2009; magnetic resonance imagin/ MRA - negative; etiology thought to be orthostasis secondary to drugs and dehydration. b. Syncope 02/2015 also felt 2/2 dehydration.    Past Surgical History:  Procedure Laterality Date   BACK SURGERY  1996   BIOPSY N/A 11/08/2013   Procedure: BIOPSY  / Tissue sampling / ulcers present in small intestine;  Surgeon: Alyce Jubilee, MD;  Location: [facility];  Service: Endoscopy;  Laterality: N/A;   CARDIAC CATHETERIZATION  2002   CARDIAC CATHETERIZATION N/A 01/26/2015   Procedure: Right Heart Cath;  Surgeon: Darlis Eisenmenger, MD;  Location: [facility] INVASIVE CV LAB;  Service: Cardiovascular;  Laterality: N/A;    CARDIOVASCULAR STRESS TEST  2008   Stress nuclear study   CARDIOVERSION N/A 03/06/2015   Procedure: CARDIOVERSION;  Surgeon: Darlis Eisenmenger, MD;  Location: [facility] ENDOSCOPY;  Service: Cardiovascular;  Laterality: N/A;   CARPAL TUNNEL RELEASE  1994   CATARACT EXTRACTION W/PHACO Right 01/10/2022   Procedure: CATARACT EXTRACTION PHACO AND INTRAOCULAR LENS PLACEMENT (IOC);  Surgeon: Tarri Farm, MD;  Location: [facility] ORS;  Service: Ophthalmology;  Laterality: Right;  CDE: 21.51   CATARACT EXTRACTION W/PHACO Left 01/24/2022   Procedure: CATARACT EXTRACTION PHACO AND INTRAOCULAR LENS PLACEMENT (IOC);  Surgeon: Tarri Farm, MD;  Location: [facility] ORS;  Service: Ophthalmology;  Laterality: Left;  CDE: 17.13   COLONOSCOPY  11/2011   Dr. Nolene Baumgarten: Internal hemorrhoids, mild diverticulosis. Random colon biopsies negative.   COLONOSCOPY N/A 11/08/2013   SLF: Normal mucosa in the terminal ileum/The colon IS redundant/  Moderate diverticulosis throughout the entire colon. ileum bx benign. colon bx benign   CRYOTHERAPY  01/23/2021   Procedure: CRYOTHERAPY;  Surgeon: Josiah Nigh, MD;  Location: [facility] ENDOSCOPY;  Service: Pulmonary;;   CRYOTHERAPY  01/24/2021   Procedure: CRYOTHERAPY;  Surgeon: Josiah Nigh, MD;  Location: [facility] ENDOSCOPY;  Service: Pulmonary;;   CRYOTHERAPY  01/26/2021   Procedure: CRYOTHERAPY;  Surgeon: Marine Sia, MD;  Location: [facility] ENDOSCOPY;  Service: Cardiopulmonary;;   ESOPHAGOGASTRODUODENOSCOPY  2008   Barrett's without dysplasia. esphagus dilated. antral erosions, h.pylori serologies negative.   ESOPHAGOGASTRODUODENOSCOPY  11/2011   Dr. Nolene Baumgarten: Barrett's esophagus, mild gastritis, diverticulum in the second portion of the duodenum repeat EGD 3 years. Small bowel biopsies negative. Gastric biopsy show reactive gastropathy but no H. pylori. Esophageal biopsies consistent with GERD. Next EGD 11/2014   ESOPHAGOGASTRODUODENOSCOPY N/A 11/21/2014   OVF:IEPP non-erosive gastritis/irregular  z-line   ESOPHAGOGASTRODUODENOSCOPY  03/2022   FOREIGN BODY REMOVAL  01/23/2021   Procedure: FOREIGN BODY REMOVAL;  Surgeon: Josiah Nigh, MD;  Location: [facility] ENDOSCOPY;  Service: Pulmonary;;   FOREIGN BODY REMOVAL  01/24/2021   Procedure: FOREIGN BODY REMOVAL;  Surgeon: Josiah Nigh, MD;  Location: [facility] ENDOSCOPY;  Service: Pulmonary;;   GIVENS CAPSULE STUDY  12/07/2011   Proximal small bowel, rare AVM. Distal small bowel, multiple ulcers noted   GIVENS CAPSULE STUDY N/A 09/27/2013   Distal small bowel ulcers extending to TI.   GIVENS CAPSULE STUDY N/A 10/10/2013   Procedure: GIVENS CAPSULE STUDY;  Surgeon: Alyce Jubilee, MD;  Location: [facility];  Service: Endoscopy;  Laterality: N/A;  7:30   HEMOSTASIS CONTROL  01/24/2021   Procedure: HEMOSTASIS CONTROL;  Surgeon: Josiah Nigh, MD;  Location: [facility] ENDOSCOPY;  Service: Pulmonary;;   HEMOSTASIS CONTROL  01/26/2021   Procedure: HEMOSTASIS CONTROL;  Surgeon: Marine Sia, MD;  Location: [facility] ENDOSCOPY;  Service: Cardiopulmonary;;   IR GASTROSTOMY TUBE MOD SED  01/11/2021   IR GASTROSTOMY TUBE REMOVAL  04/13/2021   IR KYPHO THORACIC WITH BONE BIOPSY  02/09/2018   KNEE ARTHROSCOPY WITH MEDIAL MENISECTOMY Right 06/09/2016   Procedure: KNEE ARTHROSCOPY WITH MEDIAL MENISECTOMY;  Surgeon: Darrin Emerald, MD;  Location: [facility]S;  Service: Orthopedics;  Laterality: Right;  medial and lateral menisectomy   LAMINECTOMY  1995   L4-L5   LAPAROSCOPIC CHOLECYSTECTOMY  1990s   LEFT HEART CATHETERIZATION WITH CORONARY ANGIOGRAM  01/10/2014   Procedure: LEFT HEART CATHETERIZATION WITH CORONARY ANGIOGRAM;  Surgeon: Mickiel Albany, MD;  Location: [facility] CATH LAB;  Service: Cardiovascular;;   MICROLARYNGOSCOPY WITH LASER AND BALLOON DILATION N/A 11/17/2021   Procedure: MICROLARYNGOSCOPY WITH CO2 LASER, SUBGLOTTIC LARYNGEAL MASS BIOSPY;  Surgeon: Janita Mellow, MD;  Location: [facility] OR;  Service: ENT;  Laterality: N/A;   RIGHT HEART CATHETERIZATION  N/A 01/10/2014   Procedure: RIGHT HEART CATH;  Surgeon: Mickiel Albany, MD;  Location: [facility] CATH LAB;  Service: Cardiovascular;  Laterality: N/A;   TOTAL ABDOMINAL HYSTERECTOMY  1999   TRACHEOSTOMY REVISION N/A 07/18/2021   Procedure: TRACHEOSTOMY REVISION;  Surgeon: Vernadine Golas, MD;  Location: [facility] OR;  Service: ENT;  Laterality: N/A;   TRACHEOSTOMY TUBE PLACEMENT N/A 09/01/2017   Procedure: TRACHEOSTOMY;  Surgeon: Prescott Brodie, MD;  Location: [facility] OR;  Service: ENT;  Laterality: N/A;   VIDEO BRONCHOSCOPY Bilateral 01/23/2021   Procedure: VIDEO BRONCHOSCOPY WITHOUT FLUORO;  Surgeon: Josiah Nigh, MD;  Location: [facility] ENDOSCOPY;  Service: Pulmonary;  Laterality: Bilateral;   VIDEO BRONCHOSCOPY Bilateral 01/24/2021   Procedure: VIDEO BRONCHOSCOPY WITHOUT FLUORO;  Surgeon: Josiah Nigh, MD;  Location: [facility] ENDOSCOPY;  Service: Pulmonary;  Laterality: Bilateral;   VIDEO  BRONCHOSCOPY N/A 01/26/2021   Procedure: VIDEO BRONCHOSCOPY WITHOUT FLUORO;  Surgeon: Marine Sia, MD;  Location: [facility] ENDOSCOPY;  Service: Cardiopulmonary;  Laterality: N/A;    Current Outpatient Medications  Medication Sig Dispense Refill   acetaminophen  (TYLENOL ) 650 MG CR tablet Take 1,300 mg by mouth 2 (two) times daily.     atorvastatin  (LIPITOR ) 40 MG tablet Take 1 tablet (40 mg total) by mouth daily. 90 tablet 3   busPIRone  (BUSPAR ) 30 MG tablet Take 1 tablet (30 mg total) by mouth in the morning and at bedtime. 60 tablet 3   cephALEXin  (KEFLEX ) 500 MG capsule Take 1 capsule (500 mg total) by mouth 3 (three) times daily for 5 days. 15 capsule 0   Cholecalciferol  (VITAMIN D -3 PO) Take 1 tablet by mouth daily in the afternoon.     Continuous Glucose Sensor (FREESTYLE LIBRE 2 SENSOR) MISC FOR CONTINUOUS GLUCOSE MONITORING. 2 each 11   Continuous Glucose Sensor (FREESTYLE LIBRE 2 SENSOR) MISC Use daily 1 each 3   Continuous Glucose Sensor (FREESTYLE LIBRE 2 SENSOR) MISC APPLY ONE SENSOR TO THE SKIN EVERY 14 DAYS  AS DIRECTED TO CONTINUOUSLY MONITOR GLUCOSE LEVELS 1 each 2   dicyclomine  (BENTYL ) 10 MG capsule TAKE 1 CAPSULE BY MOUTH UP TO 4 TIMES DAILY BEFORE MEALS AND AT BEDTIME (STOP IF CONSTIPATION). 120 capsule 3   donepezil  (ARICEPT ) 5 MG tablet TAKE 1 TABLET BY MOUTH AT BEDTIME FOR dementia 90 tablet 3   ELIQUIS  5 MG TABS tablet TAKE 1 TABLET BY MOUTH TWICE DAILY AS A BLOOD THINNER 180 tablet 3   furosemide  (LASIX ) 20 MG tablet Take 1 tablet (20 mg total) by mouth daily. 100 tablet 3   gabapentin  (NEURONTIN ) 100 MG capsule TAKE ONE CAPSULE BY MOUTH THREE TIMES DAILY FOR NEUROPATHIC PAIN 270 capsule 3   insulin  aspart (NOVOLOG  FLEXPEN) 100 UNIT/ML FlexPen Inject 0-8 Units into the skin 3 (three) times daily with meals. 15 mL 4   insulin  glargine (LANTUS  SOLOSTAR) 100 UNIT/ML Solostar Pen Inject 20 Units into the skin at bedtime. 15 mL 4   ipratropium-albuterol  (DUONEB) 0.5-2.5 (3) MG/3ML SOLN Take 3 mLs by nebulization every 4 (four) hours as needed (Asthma).     JARDIANCE  10 MG TABS tablet TAKE ONE TABLET BY MOUTH EVERY DAY. 30 tablet 2   levETIRAcetam  (KEPPRA ) 500 MG tablet TAKE 1 TABLET BY MOUTH TWICE DAILY FOR SEIZURE PREVENTION 180 tablet 3   levothyroxine  (SYNTHROID ) 175 MCG tablet Take 175 mcg by mouth daily before breakfast.     loperamide  (IMODIUM ) 2 MG capsule Take 2 mg by mouth every 8 (eight) hours as needed for diarrhea or loose stools.     LORazepam  (ATIVAN ) 1 MG tablet Take 1 to 1 1/2 tablets once daily as needed for anxiety or to aid with sleep 30 tablet 0   metoprolol  tartrate (LOPRESSOR ) 25 MG tablet TAKE 1 TABLET BY MOUTH TWICE DAILY FOR HIGH BLOOD PRESSURE OR HEART RATE CONTROL 180 tablet 3   nitroGLYCERIN  (NITROSTAT ) 0.4 MG SL tablet Place 0.4 mg under the tongue every 5 (five) minutes as needed for chest pain.     pantoprazole  (PROTONIX ) 40 MG tablet TAKE 1 TABLET BY MOUTH TWICE DAILY FOR acid reflux 180 tablet 3   potassium chloride  (KLOR-CON  M) 10 MEQ tablet TAKE 2 TABLETS BY  MOUTH DAILY TO REPLACE POTASSIUM. 60 tablet 2   sertraline  (ZOLOFT ) 50 MG tablet Take 1 tablet (50 mg total) by mouth daily. 30 tablet 3   traMADol  (ULTRAM )  50 MG tablet Take 1 tablet (50 mg total) by mouth 3 (three) times daily as needed for moderate pain (pain score 4-6) or severe pain (pain score 7-10). 90 tablet 0   No current facility-administered medications for this visit.    Allergies as of 11/08/2023 - Review Complete 11/08/2023  Allergen Reaction Noted   Codeine Nausea And Vomiting and Other (See Comments) 11/28/2019   Dilaudid  [hydromorphone  hcl] Other (See Comments) 12/31/2015   Reglan  [metoclopramide ] Other (See Comments) 06/04/2014   Fentanyl   11/08/2023   Levsin [hyoscyamine ] Diarrhea 09/05/2019    Family History  Problem Relation Age of Onset   Hypertension Mother    Alzheimer's disease Mother    Stroke Mother    Heart attack Mother    Breast cancer Sister    Hypertension Other    Heart disease Neg Hx    Colon cancer Neg Hx    Liver disease Neg Hx     Social History   Socioeconomic History   Marital status: Married    Spouse name: Not on file   Number of children: Not on file   Years of education: Not on file   Highest education level: Not on file  Occupational History   Occupation: Passenger transport manager at [employer]    Employer: [employer]    Comment: disabled    Employer: RETIRED  Tobacco Use   Smoking status: Former    Current packs/day: 0.00    Average packs/day: 0.3 packs/day for 27.3 years (6.8 ttl pk-yrs)    Types: Cigarettes    Start date: 02/26/1966    Quit date: 06/30/1993    Years since quitting: 30.3   Smokeless tobacco: Never   Tobacco comments:    quit in 1984  Vaping Use   Vaping status: Never Used  Substance and Sexual Activity   Alcohol use: Not Currently    Comment: Occasional alcohol 30 years ago (09/02/21)   Drug use: No   Sexual activity: Yes    Birth control/protection: None, Surgical  Other Topics Concern   Not on file  Social History  Narrative   Sedentary   4 children, "blended family"   Social Drivers of Corporate investment banker Strain: Not on file  Food Insecurity: No Food Insecurity (08/06/2023)   Hunger Vital Sign    Worried About Running Out of Food in the Last Year: Never true    Ran Out of Food in the Last Year: Never true  Transportation Needs: No Transportation Needs (08/06/2023)   PRAPARE - Administrator, Civil Service (Medical): No    Lack of Transportation (Non-Medical): No  Physical Activity: Not on file  Stress: Not on file  Social Connections: Not on file    Review of Systems: Gen: Denies fever, chills, cold or flulike symptoms, presyncope, syncope. GI: See HPI Heme: See HPI  Physical Exam: BP 109/62 (BP Location: Right Arm, Patient Position: Sitting, Cuff Size: Normal)   Pulse 73   Temp 98.3 F (36.8 C) (Oral)   Ht 5\' 7"  (1.702 m)   Wt 154 lb 6.4 oz (70 kg)   SpO2 90%   BMI 24.18 kg/m  General:   Alert and oriented. No distress noted. Pleasant and cooperative.  Tracheostomy in place with voicebox.  Wearing nasal cannula. Head:  Normocephalic and atraumatic. Eyes:  Conjuctiva clear without scleral icterus. Abdomen:  +BS, soft, non-tender and non-distended. No rebound or guarding. No HSM or masses noted. Msk:  Symmetrical without gross deformities. Normal posture. Extremities:  Without edema. Neurologic:  Alert and  oriented x4 Psych:  Normal mood and affect.    Assessment:  73 y.o. female with history of CHF, pulmonary hypertension, atrial fibrillation on Eliquis , restrictive lung disease, hospitalized in June 2022 with respiratory failure and required permanent tracheostomy, IDA previously following with hematology and receiving IV iron  due to oral iron  intolerance, chronic lower abdominal pain worsened postprandially with negative mesenteric US  in 2021, gastroparesis, GERD, IBS with alternating constipation and diarrhea,  colitis in October 2024, pending today for  follow-up of diarrhea.   Diarrhea:  Chronic in the setting of IBS mixed type.  However, notes some worsening symptoms recently. This could be related to IBS, recent increase in Zoloft , and possible lactose intolerance, but she is at risk for infectious diarrhea, specifically C. difficile in the setting of recurrent hospitalizations and antibiotics over the last several months.  Will check stool studies to rule this out.  I will also check TSH to ensure thyroid  function is within normal limits.  We have previously discussed colonoscopy due to her history of colitis in October 2024, but patient has requested to avoid this unless absolutely necessary due to her high risk of anesthesia.  As she has no significant abdominal pain or rectal bleeding, we will continue to hold off on colonoscopy.   Plan:  C. difficile GDH and toxin A/B, GI pathogen panel, Giardia, Cryptosporidium, TSH. Recommended strict lactose-free diet for now. Okay to continue current medications including dicyclomine  scheduled and Imodium  as needed for now. If stool testing is negative and diarrhea continues despite following a lactose-free diet, would consider treating with a course of Xifaxan  for IBS-D.    Shana Daring, PA-C [facility] 11/08/2023

## 2023-11-07 ENCOUNTER — Ambulatory Visit (HOSPITAL_COMMUNITY)
Admission: RE | Admit: 2023-11-07 | Discharge: 2023-11-07 | Disposition: A | Discharge status: Home / Self Care | Source: Ambulatory Visit | Attending: Acute Care | Admitting: Acute Care

## 2023-11-07 DIAGNOSIS — I2723 Pulmonary hypertension due to lung diseases and hypoxia: Secondary | ICD-10-CM | POA: Diagnosis present

## 2023-11-07 DIAGNOSIS — J386 Stenosis of larynx: Secondary | ICD-10-CM | POA: Diagnosis present

## 2023-11-07 DIAGNOSIS — J9611 Chronic respiratory failure with hypoxia: Secondary | ICD-10-CM | POA: Insufficient documentation

## 2023-11-07 DIAGNOSIS — G4733 Obstructive sleep apnea (adult) (pediatric): Secondary | ICD-10-CM | POA: Diagnosis present

## 2023-11-07 DIAGNOSIS — Z930 Tracheostomy status: Secondary | ICD-10-CM | POA: Diagnosis not present

## 2023-11-07 DIAGNOSIS — L929 Granulomatous disorder of the skin and subcutaneous tissue, unspecified: Secondary | ICD-10-CM | POA: Diagnosis present

## 2023-11-07 DIAGNOSIS — Z430 Encounter for attention to tracheostomy: Secondary | ICD-10-CM | POA: Diagnosis present

## 2023-11-07 DIAGNOSIS — I2721 Secondary pulmonary arterial hypertension: Secondary | ICD-10-CM | POA: Diagnosis not present

## 2023-11-07 DIAGNOSIS — J9612 Chronic respiratory failure with hypercapnia: Secondary | ICD-10-CM | POA: Diagnosis not present

## 2023-11-07 DIAGNOSIS — E662 Morbid (severe) obesity with alveolar hypoventilation: Secondary | ICD-10-CM | POA: Diagnosis not present

## 2023-11-07 DIAGNOSIS — Z9911 Dependence on respirator [ventilator] status: Secondary | ICD-10-CM

## 2023-11-07 NOTE — Progress Notes (Addendum)
 Tracheostomy Procedure Note  [name] [id number] [birth year]  Pre Procedure Tracheostomy Information  Trach Brand: Shiley Size:  6.0 6.0 CN75R Style: Cuffed Secured by: Velcro   Procedure: Trach Cleaning and Trach Changed Bedside Bronchoscopy done for observation of airway. Lidocaine  neb given prior to bronch and trach change  Post Procedure Tracheostomy Information  Trach Brand: Shiley Size:  6.0 6.0 CN75R Style: Cuffed Secured by: Velcro   Post Procedure Evaluation:  ETCO2 positive color change from yellow to purple : Yes.   Vital signs:VSS Patients current condition: stable Complications: No apparent complications Trach site exam: clean, dry Wound care done: 4 x 4 gauze Patient did tolerate procedure well.   Education: Trach site care  Prescription needs: none    Additional needs:  PMV given to patient at this visit,  Patient given a box of #6 inner cannulas [id number]  along with trach wound bandages.

## 2023-11-07 NOTE — Progress Notes (Signed)
 Reason for visit Planned trach change  HPI 73 year old pt well known to me. Follow her for trach dependence for OHS,OSA on nocturnal ventilation. I am seeing her today for planned trach change. Last change was 4/24. Tyra Galley called me 5/4 reporting fairly sig trach site discomfort. By description (see my note on that day) sounded like localized trach site infection so we agreed to treat as such w/ abx and then they would let me know today if still having discomfort.  She presents today for evaluation of trach site pain     Recent ENT and additional history  -Referred to ENT May 2023 for subglottis stenosis. subglottic mass removed  -direct laryngoscopy and airway evaluation in September 2023.  Large subglottic posteriorly-based granuloma which was eminating superiorly through the glottis, biopsied and send for permanent pathology; thick circumferential subglottic granulations tissue, which was removed with cupped forceps and coblator  Much improved subglottic airway following the procedure F/u 11/8: had been doing better initially post-op.  he visualized subglottis and proximal trachea are patent and the Shiley tracheostomy tube is visible in the subglottis the trachea is edematous and there does not appear to be much room around the tracheostomy device they recommended fenestrated trach which we placed.  This was subsequently discontinued as the patient did not tolerate it. She has surgery scheduled for 1/23 at [facility] w/ plan for Micro laryngectomy, removal of granulation, tracheal dilation and kenalog injection w/ hope that this will allow her to phonate and tolerate PMV again. We have agreed we do not want to work towards decannulation as she has been intol of BIPAP in past. While waiting I see she has had a f/u echo and c/w 2022 her EF has improved from 45-50% to 55-60%, there is mod LVH, RV fxn mildly reduced, she has severe elevation of PAS (88 mmHg).  1/23 s/p DL removal of granulation tissue and  tracheal dilation. Tolerated well. Speech improved post-operatively.  2/1 thru 2/4 admitted to [facility] for PNA   Review of Systems  Constitutional:  Negative for fever and malaise/fatigue.  HENT:         Neck discomfort at stoma site. Has clear nasal drainage (chronic and seasonal)no new cough. Neck discomfort really most w/ movement but comfortable midline   Eyes: Negative.   Respiratory: Negative.    Cardiovascular: Negative.   Gastrointestinal: Negative.   Genitourinary: Negative.   Musculoskeletal: Negative.   Skin: Negative.   Neurological: Negative.   Endo/Heme/Allergies: Negative.   Psychiatric/Behavioral: Negative.      Exam General 73 year old female well known to me. Presents today for evaluation of neck pain. She is uncomfortable but not id distress HENT NCAT the trach stoma is unremarkable w/ exception of very small area of pinpoint granulation located between 12 and 1 o'clock which could be reason for pain. Doing fairly well w/ PMV once guided on deep breath prior to talking  Pulm clear Card rrr Abd soft  Ext trace LE edema Neuro baseline   Procedure Fiberoptic evaluation of upper airway.  After nebulized lidocaine  a pediatric bronchoscope was advanced into the trachea. The airway was assessed initially from the distal end of the trach to the carina. There were no areas of ulceration or redness. The trach was sitting midline. After that we slowly removed the trach w/ the bronchoscope in place to assess from where the end of the trach was to the level of the entry of stoma. Again no ulceration or cause for concern.  After this a small mepiplex dressing was placed over top of the small area of granulation to provide some pressure relief  and then the new trach was placed over obturator. We again evaluated placement w/ scope and trach was midline. Pt tolerated well.   Active Problems:   Tracheostomy status (HCC)   Subglottic stenosis   Granulation tissue w/ associated  trach site discomfort.   OSA (obstructive sleep apnea)   Chronic respiratory failure with hypoxia and hypercapnia (HCC)   Dependence on home ventilator (HCC)   Obesity hypoventilation syndrome (HCC)   WHO group 3 pulmonary arterial hypertension (HCC)    Granulation tissue w/ associated trach site discomfort. Overview: Small area of granulation between 12 and 1 o'clock of stoma.  Suspect this is causing her pain. There was no dependent edema or really any significant redness or drainage from the stoma site Plan Mepiplex dressing (keep in place and change q3d for pressure relief) Will cont the keflex  (already ordered) don't really think infected but could be early infxn  No indication for imaging at this point I will re-assess in 2-3 weeks Needs to keep trach collar fairly tight as movement will make the granulation tissue worse    Tracheostomy status (HCC) Overview:  Trach Brand: Shiley Size:  6.0 6CN75R Style: Cuffed Secured by: Velcro  Last change today 5/6  prior change 4/24  Discussion Doing well  Trach stable. Not candidate for removal d/t need for nocturnal ventilation and intolerance of CPAP ALSO on-going issues w/ SGS  Plan Continue routine trach care ROV 2-3 weeks  Continue nocturnal ventilation           My time 40 minutes   Hadley Leu ACNP-BC [facility] Pulmonary/Critical Care Pager # [phone] OR # [phone] if no answer

## 2023-11-08 ENCOUNTER — Telehealth: Payer: Self-pay

## 2023-11-08 ENCOUNTER — Ambulatory Visit: Admitting: Gastroenterology

## 2023-11-08 ENCOUNTER — Encounter: Payer: Self-pay | Admitting: Gastroenterology

## 2023-11-08 VITALS — BP 109/62 | HR 73 | Temp 98.3°F | Ht 67.0 in | Wt 154.4 lb

## 2023-11-08 DIAGNOSIS — K582 Mixed irritable bowel syndrome: Secondary | ICD-10-CM

## 2023-11-08 DIAGNOSIS — R197 Diarrhea, unspecified: Secondary | ICD-10-CM

## 2023-11-08 NOTE — Telephone Encounter (Signed)
 Copied from CRM [id number]. Topic: Clinical - Home Health Verbal Orders >> Nov 08, 2023 10:56 AM Zipporah Him wrote: Caller/Agency: G[facility] Callback Number: [phone], secure line to leave VM. Service Requested: Speech Therapy Frequency: N/A Any new concerns about the patient? Patient requests reschedule ST eval to next medicare week, due to patient scheduling conflict. Reschedule week Tuesday, May 13, 1:45-2:45. Daughter Elidia Grout is agreeable to this change. Needs verbal ok.

## 2023-11-08 NOTE — Telephone Encounter (Signed)
 Sent to FO for reschedule.

## 2023-11-08 NOTE — Patient Instructions (Signed)
 Please have stool testing and blood work completed at [facility].   Follow strict lactose free diet for now.   We will call with results of recommendations.  Shana Daring, PA-C [facility]

## 2023-11-08 NOTE — Telephone Encounter (Signed)
 First attempt at return call lvm.

## 2023-11-08 NOTE — Telephone Encounter (Signed)
 That's okay

## 2023-11-09 ENCOUNTER — Inpatient Hospital Stay: Payer: PPO | Admitting: Oncology

## 2023-11-09 ENCOUNTER — Encounter (HOSPITAL_COMMUNITY): Payer: Self-pay

## 2023-11-09 ENCOUNTER — Ambulatory Visit (HOSPITAL_COMMUNITY)
Admission: RE | Admit: 2023-11-09 | Discharge: 2023-11-09 | Disposition: A | Discharge status: Home / Self Care | Source: Ambulatory Visit | Attending: Oncology | Admitting: Oncology

## 2023-11-09 VITALS — BP 106/67 | HR 54 | Wt 153.7 lb

## 2023-11-09 DIAGNOSIS — D696 Thrombocytopenia, unspecified: Secondary | ICD-10-CM

## 2023-11-09 DIAGNOSIS — Z1231 Encounter for screening mammogram for malignant neoplasm of breast: Secondary | ICD-10-CM | POA: Insufficient documentation

## 2023-11-09 DIAGNOSIS — D508 Other iron deficiency anemias: Secondary | ICD-10-CM | POA: Diagnosis not present

## 2023-11-09 DIAGNOSIS — D500 Iron deficiency anemia secondary to blood loss (chronic): Secondary | ICD-10-CM

## 2023-11-09 DIAGNOSIS — D509 Iron deficiency anemia, unspecified: Secondary | ICD-10-CM | POA: Diagnosis not present

## 2023-11-09 NOTE — Progress Notes (Signed)
 [facility] [address], [place] [address]   Clinic Day:  11/09/2023  Referring physician: Del Orbe Polanco, Ilian*  Patient Care Team: Del Amber Bail, Rogerio Clay, FNP as PCP - General (Family Medicine) Amanda Jungling, Joyceann No, MD as PCP - Cardiology (Cardiology) Wenona Hamilton, MD as PCP - [facility] Cardiology (Cardiology) Alyce Jubilee, MD (Inactive) as Consulting Physician (Gastroenterology) Everette Hives, Marge Shed, MD (Inactive) as Consulting Physician (Hematology and Oncology) Vinetta Greening, DO as Consulting Physician (Internal Medicine)  CHIEF COMPLAINT/PURPOSE OF CONSULT:   Diagnosis: Iron  deficiency anemia  HISTORY OF PRESENT ILLNESS:   [name] is a 73 y.o. female presenting to clinic today for evaluation of iron  deficiency anemia. She was last seen in clinic on 06/29/23.  She received 1 g of Monoferric  on 03/30/2023.  She was hospitalized from 08/05/2023 through 08/08/2023 for pneumonia.  Patient has past medical history significant for chronic respiratory failure and supraglottic stenosis.  She has her trach changed monthly and continues to use her ventilator at home as needed.  She was instructed to stop B12 supplements for a rising B12 level.  Reports overall feeling well today.  She presents with her daughter.  Appetite level is 50% and energy level is 25%.  Continues to have intermittent right knee pain.  She denies any bright red blood per rectum, melena or hematochezia.  Reports some increased fatigue and sleeping longer over the past month or so.  Reports she has intermittent diarrhea secondary to IBS.    PAST MEDICAL HISTORY:   Past Medical History: Past Medical History:  Diagnosis Date   Abnormal pulmonary function test    Anemia    H/H of 10/30 with a normal MCV in 12/09   Anxiety    Arthritis    Atrial fibrillation (HCC)    Barrett's esophagus    Diagnosed 1995. Last EGD 2016-NO BARRETT'S.    Chest pain    a. 2022 Cath: nl cors; b. 2008 Neg  myoview; c. 01/2014 Cath: Nl cors. EF 40%; c. 08/2020 NSTEMI/Cath [facility]): nl cors.   Chronic anticoagulation    Chronic combined systolic and diastolic CHF (congestive heart failure) (HCC)    a. 10/2014 Echo: EF40%; b. 01/2015 Echo: EF 55-60%; c. 11/2020 Echo: EF 55-60%, mod LVH, sev BAE, mild MR, mod TR. PASP ; d. 01/2021 Echo: EF 45-50%, glob HK, mod conc LVH, mod reduced RV fxn, RVSP 60.22mmHg, Mod TR, mild to mod MR, sev dil LA.   Chronic LBP    Surgical intervention in 1996   Diabetes mellitus, type 2 (HCC)    Insulin  therapy; exacerbated by prednisone    Gastroparesis    99% retention 05/2008 on GES   GERD (gastroesophageal reflux disease)    Heart attack (HCC) 08/13/2020   Hiatal hernia    Hyperlipidemia    Hypertension    Hypothyroid    IBS (irritable bowel syndrome)    Obesity    OSA on CPAP    had CPAP and cannot tolerate.   Paroxysmal atrial fibrillation (HCC)    a.CHA2DS2VASc = 6-->eliquis . Also on Amio.   Pulmonary hypertension (HCC) 01/2015   a. 01/2015 RHC: Predominantly pulmonary venous hypertension but may be component of PAH (PA mean 52, PCWP 31); b. 01/2021 Echo: RVSP 60.32mmHg w/ mod reduced RV fxn.   Seizures (HCC)    last seizure was 2 years ago; on keppra  for this; unknown etiology   Syncope    a. Admitted 05/2009; magnetic resonance imagin/ MRA - negative;  etiology thought to be orthostasis secondary to drugs and dehydration. b. Syncope 02/2015 also felt 2/2 dehydration.    Surgical History: Past Surgical History:  Procedure Laterality Date   BACK SURGERY  1996   BIOPSY N/A 11/08/2013   Procedure: BIOPSY  / Tissue sampling / ulcers present in small intestine;  Surgeon: Alyce Jubilee, MD;  Location: [facility] ENDO [facility];  Service: Endoscopy;  Laterality: N/A;   CARDIAC CATHETERIZATION  2002   CARDIAC CATHETERIZATION N/A 01/26/2015   Procedure: Right Heart Cath;  Surgeon: Darlis Eisenmenger, MD;  Location: [facility] INVASIVE CV LAB;  Service: Cardiovascular;  Laterality: N/A;    CARDIOVASCULAR STRESS TEST  2008   Stress nuclear study   CARDIOVERSION N/A 03/06/2015   Procedure: CARDIOVERSION;  Surgeon: Darlis Eisenmenger, MD;  Location: [facility] ENDOSCOPY;  Service: Cardiovascular;  Laterality: N/A;   CARPAL TUNNEL RELEASE  1994   CATARACT EXTRACTION W/PHACO Right 01/10/2022   Procedure: CATARACT EXTRACTION PHACO AND INTRAOCULAR LENS PLACEMENT (IOC);  Surgeon: Tarri Farm, MD;  Location: [facility] ORS;  Service: Ophthalmology;  Laterality: Right;  CDE: 21.51   CATARACT EXTRACTION W/PHACO Left 01/24/2022   Procedure: CATARACT EXTRACTION PHACO AND INTRAOCULAR LENS PLACEMENT (IOC);  Surgeon: Tarri Farm, MD;  Location: [facility] ORS;  Service: Ophthalmology;  Laterality: Left;  CDE: 17.13   COLONOSCOPY  11/2011   Dr. Nolene Baumgarten: Internal hemorrhoids, mild diverticulosis. Random colon biopsies negative.   COLONOSCOPY N/A 11/08/2013   SLF: Normal mucosa in the terminal ileum/The colon IS redundant/  Moderate diverticulosis throughout the entire colon. ileum bx benign. colon bx benign   CRYOTHERAPY  01/23/2021   Procedure: CRYOTHERAPY;  Surgeon: Josiah Nigh, MD;  Location: [facility] ENDOSCOPY;  Service: Pulmonary;;   CRYOTHERAPY  01/24/2021   Procedure: Consuela Denier;  Surgeon: Josiah Nigh, MD;  Location: [facility] ENDOSCOPY;  Service: Pulmonary;;   CRYOTHERAPY  01/26/2021   Procedure: Consuela Denier;  Surgeon: Marine Sia, MD;  Location: [facility] ENDOSCOPY;  Service: Cardiopulmonary;;   ESOPHAGOGASTRODUODENOSCOPY  2008   Barrett's without dysplasia. esphagus dilated. antral erosions, h.pylori serologies negative.   ESOPHAGOGASTRODUODENOSCOPY  11/2011   Dr. Nolene Baumgarten: Barrett's esophagus, mild gastritis, diverticulum in the second portion of the duodenum repeat EGD 3 years. Small bowel biopsies negative. Gastric biopsy show reactive gastropathy but no H. pylori. Esophageal biopsies consistent with GERD. Next EGD 11/2014   ESOPHAGOGASTRODUODENOSCOPY N/A 11/21/2014   ZOX:WRUE non-erosive gastritis/irregular  z-line   ESOPHAGOGASTRODUODENOSCOPY  03/2022   FOREIGN BODY REMOVAL  01/23/2021   Procedure: FOREIGN BODY REMOVAL;  Surgeon: Josiah Nigh, MD;  Location: [facility] ENDOSCOPY;  Service: Pulmonary;;   FOREIGN BODY REMOVAL  01/24/2021   Procedure: FOREIGN BODY REMOVAL;  Surgeon: Josiah Nigh, MD;  Location: [facility] ENDOSCOPY;  Service: Pulmonary;;   GIVENS CAPSULE STUDY  12/07/2011   Proximal small bowel, rare AVM. Distal small bowel, multiple ulcers noted   GIVENS CAPSULE STUDY N/A 09/27/2013   Distal small bowel ulcers extending to TI.   GIVENS CAPSULE STUDY N/A 10/10/2013   Procedure: GIVENS CAPSULE STUDY;  Surgeon: Alyce Jubilee, MD;  Location: [facility];  Service: Endoscopy;  Laterality: N/A;  7:30   HEMOSTASIS CONTROL  01/24/2021   Procedure: HEMOSTASIS CONTROL;  Surgeon: Josiah Nigh, MD;  Location: [facility] ENDOSCOPY;  Service: Pulmonary;;   HEMOSTASIS CONTROL  01/26/2021   Procedure: HEMOSTASIS CONTROL;  Surgeon: Marine Sia, MD;  Location: [facility] ENDOSCOPY;  Service: Cardiopulmonary;;   IR GASTROSTOMY TUBE MOD SED  01/11/2021   IR GASTROSTOMY TUBE REMOVAL  04/13/2021   IR KYPHO THORACIC WITH BONE BIOPSY  02/09/2018   KNEE ARTHROSCOPY WITH MEDIAL MENISECTOMY Right 06/09/2016   Procedure: KNEE ARTHROSCOPY WITH MEDIAL MENISECTOMY;  Surgeon: Darrin Emerald, MD;  Location: [facility] ORS;  Service: Orthopedics;  Laterality: Right;  medial and lateral menisectomy   LAMINECTOMY  1995   L4-L5   LAPAROSCOPIC CHOLECYSTECTOMY  1990s   LEFT HEART CATHETERIZATION WITH CORONARY ANGIOGRAM  01/10/2014   Procedure: LEFT HEART CATHETERIZATION WITH CORONARY ANGIOGRAM;  Surgeon: Mickiel Albany, MD;  Location: [facility] CATH LAB;  Service: Cardiovascular;;   MICROLARYNGOSCOPY WITH LASER AND BALLOON DILATION N/A 11/17/2021   Procedure: MICROLARYNGOSCOPY WITH CO2 LASER, SUBGLOTTIC LARYNGEAL MASS BIOSPY;  Surgeon: Janita Mellow, MD;  Location: [facility] OR;  Service: ENT;  Laterality: N/A;   RIGHT HEART CATHETERIZATION  N/A 01/10/2014   Procedure: RIGHT HEART CATH;  Surgeon: Mickiel Albany, MD;  Location: [facility] CATH LAB;  Service: Cardiovascular;  Laterality: N/A;   TOTAL ABDOMINAL HYSTERECTOMY  1999   TRACHEOSTOMY REVISION N/A 07/18/2021   Procedure: TRACHEOSTOMY REVISION;  Surgeon: Vernadine Golas, MD;  Location: [facility] OR;  Service: ENT;  Laterality: N/A;   TRACHEOSTOMY TUBE PLACEMENT N/A 09/01/2017   Procedure: TRACHEOSTOMY;  Surgeon: Prescott Brodie, MD;  Location: [facility] OR;  Service: ENT;  Laterality: N/A;   VIDEO BRONCHOSCOPY Bilateral 01/23/2021   Procedure: VIDEO BRONCHOSCOPY WITHOUT FLUORO;  Surgeon: Josiah Nigh, MD;  Location: [facility] ENDOSCOPY;  Service: Pulmonary;  Laterality: Bilateral;   VIDEO BRONCHOSCOPY Bilateral 01/24/2021   Procedure: VIDEO BRONCHOSCOPY WITHOUT FLUORO;  Surgeon: Josiah Nigh, MD;  Location: [facility] ENDOSCOPY;  Service: Pulmonary;  Laterality: Bilateral;   VIDEO BRONCHOSCOPY N/A 01/26/2021   Procedure: VIDEO BRONCHOSCOPY WITHOUT FLUORO;  Surgeon: Marine Sia, MD;  Location: [facility] ENDOSCOPY;  Service: Cardiopulmonary;  Laterality: N/A;    Social History: Social History   Socioeconomic History   Marital status: Married    Spouse name: Not on file   Number of children: Not on file   Years of education: Not on file   Highest education level: Not on file  Occupational History   Occupation: Passenger transport manager at [employer]    Employer: [employer]    Comment: disabled    Employer: RETIRED  Tobacco Use   Smoking status: Former    Current packs/day: 0.00    Average packs/day: 0.3 packs/day for 27.3 years (6.8 ttl pk-yrs)    Types: Cigarettes    Start date: 02/26/1966    Quit date: 06/30/1993    Years since quitting: 30.3   Smokeless tobacco: Never   Tobacco comments:    quit in 1984  Vaping Use   Vaping status: Never Used  Substance and Sexual Activity   Alcohol use: Not Currently    Comment: Occasional alcohol 30 years ago (09/02/21)   Drug use: No   Sexual activity: Yes    Birth  control/protection: None, Surgical  Other Topics Concern   Not on file  Social History Narrative   Sedentary   4 children, "blended family"   Social Drivers of Corporate investment banker Strain: Not on file  Food Insecurity: No Food Insecurity (08/06/2023)   Hunger Vital Sign    Worried About Running Out of Food in the Last Year: Never true    Ran Out of Food in the Last Year: Never true  Transportation Needs: No Transportation Needs (08/06/2023)   PRAPARE - Transportation    Lack of Transportation (Medical): No    Lack of  Transportation (Non-Medical): No  Physical Activity: Not on file  Stress: Not on file  Social Connections: Not on file  Intimate Partner Violence: Not At Risk (08/06/2023)   Humiliation, Afraid, Rape, and Kick questionnaire    Fear of Current or Ex-Partner: No    Emotionally Abused: No    Physically Abused: No    Sexually Abused: No    Family History: Family History  Problem Relation Age of Onset   Hypertension Mother    Alzheimer's disease Mother    Stroke Mother    Heart attack Mother    Breast cancer Sister    Hypertension Other    Heart disease Neg Hx    Colon cancer Neg Hx    Liver disease Neg Hx     Current Medications:  Current Outpatient Medications:    acetaminophen  (TYLENOL ) 650 MG CR tablet, Take 1,300 mg by mouth 2 (two) times daily., Disp: , Rfl:    atorvastatin  (LIPITOR ) 40 MG tablet, Take 1 tablet (40 mg total) by mouth daily., Disp: 90 tablet, Rfl: 3   busPIRone  (BUSPAR ) 30 MG tablet, Take 1 tablet (30 mg total) by mouth in the morning and at bedtime., Disp: 60 tablet, Rfl: 3   cephALEXin  (KEFLEX ) 500 MG capsule, Take 1 capsule (500 mg total) by mouth 3 (three) times daily for 5 days., Disp: 15 capsule, Rfl: 0   Cholecalciferol  (VITAMIN D -3 PO), Take 1 tablet by mouth daily in the afternoon., Disp: , Rfl:    Continuous Glucose Sensor (FREESTYLE LIBRE 2 SENSOR) MISC, FOR CONTINUOUS GLUCOSE MONITORING., Disp: 2 each, Rfl: 11   Continuous  Glucose Sensor (FREESTYLE LIBRE 2 SENSOR) MISC, Use daily, Disp: 1 each, Rfl: 3   Continuous Glucose Sensor (FREESTYLE LIBRE 2 SENSOR) MISC, APPLY ONE SENSOR TO THE SKIN EVERY 14 DAYS AS DIRECTED TO CONTINUOUSLY MONITOR GLUCOSE LEVELS, Disp: 1 each, Rfl: 2   dicyclomine  (BENTYL ) 10 MG capsule, TAKE 1 CAPSULE BY MOUTH UP TO 4 TIMES DAILY BEFORE MEALS AND AT BEDTIME (STOP IF CONSTIPATION)., Disp: 120 capsule, Rfl: 3   donepezil  (ARICEPT ) 5 MG tablet, TAKE 1 TABLET BY MOUTH AT BEDTIME FOR dementia, Disp: 90 tablet, Rfl: 3   ELIQUIS  5 MG TABS tablet, TAKE 1 TABLET BY MOUTH TWICE DAILY AS A BLOOD THINNER, Disp: 180 tablet, Rfl: 3   furosemide  (LASIX ) 20 MG tablet, Take 1 tablet (20 mg total) by mouth daily., Disp: 100 tablet, Rfl: 3   gabapentin  (NEURONTIN ) 100 MG capsule, TAKE ONE CAPSULE BY MOUTH THREE TIMES DAILY FOR NEUROPATHIC PAIN, Disp: 270 capsule, Rfl: 3   insulin  aspart (NOVOLOG  FLEXPEN) 100 UNIT/ML FlexPen, Inject 0-8 Units into the skin 3 (three) times daily with meals., Disp: 15 mL, Rfl: 4   insulin  glargine (LANTUS  SOLOSTAR) 100 UNIT/ML Solostar Pen, Inject 20 Units into the skin at bedtime., Disp: 15 mL, Rfl: 4   ipratropium-albuterol  (DUONEB) 0.5-2.5 (3) MG/3ML SOLN, Take 3 mLs by nebulization every 4 (four) hours as needed (Asthma)., Disp: , Rfl:    JARDIANCE  10 MG TABS tablet, TAKE ONE TABLET BY MOUTH EVERY DAY., Disp: 30 tablet, Rfl: 2   levETIRAcetam  (KEPPRA ) 500 MG tablet, TAKE 1 TABLET BY MOUTH TWICE DAILY FOR SEIZURE PREVENTION, Disp: 180 tablet, Rfl: 3   levothyroxine  (SYNTHROID ) 175 MCG tablet, Take 175 mcg by mouth daily before breakfast., Disp: , Rfl:    loperamide  (IMODIUM ) 2 MG capsule, Take 2 mg by mouth every 8 (eight) hours as needed for diarrhea or loose stools., Disp: , Rfl:  LORazepam  (ATIVAN ) 1 MG tablet, Take 1 to 1 1/2 tablets once daily as needed for anxiety or to aid with sleep, Disp: 30 tablet, Rfl: 0   metoprolol  tartrate (LOPRESSOR ) 25 MG tablet, TAKE 1 TABLET  BY MOUTH TWICE DAILY FOR HIGH BLOOD PRESSURE OR HEART RATE CONTROL, Disp: 180 tablet, Rfl: 3   nitroGLYCERIN  (NITROSTAT ) 0.4 MG SL tablet, Place 0.4 mg under the tongue every 5 (five) minutes as needed for chest pain., Disp: , Rfl:    pantoprazole  (PROTONIX ) 40 MG tablet, TAKE 1 TABLET BY MOUTH TWICE DAILY FOR acid reflux, Disp: 180 tablet, Rfl: 3   potassium chloride  (KLOR-CON  M) 10 MEQ tablet, TAKE 2 TABLETS BY MOUTH DAILY TO REPLACE POTASSIUM., Disp: 60 tablet, Rfl: 2   sertraline  (ZOLOFT ) 50 MG tablet, Take 1 tablet (50 mg total) by mouth daily., Disp: 30 tablet, Rfl: 3   traMADol  (ULTRAM ) 50 MG tablet, Take 1 tablet (50 mg total) by mouth 3 (three) times daily as needed for moderate pain (pain score 4-6) or severe pain (pain score 7-10)., Disp: 90 tablet, Rfl: 0   Allergies: Allergies  Allergen Reactions   Codeine Nausea And Vomiting and Other (See Comments)    Hallucinations   Dilaudid  [Hydromorphone  Hcl] Other (See Comments)    Syncope    Reglan  [Metoclopramide ] Other (See Comments)    Dyskinesia    Fentanyl      Other Reaction(s): Not available   Levsin [Hyoscyamine ] Diarrhea    Caused worse diarrhea    REVIEW OF SYSTEMS:   Review of Systems  Constitutional:  Positive for chills and fatigue.  Respiratory:  Positive for cough.   Gastrointestinal:  Positive for diarrhea and nausea.  Neurological:  Positive for dizziness and headaches.     VITALS:   There were no vitals taken for this visit.  Wt Readings from Last 3 Encounters:  11/08/23 154 lb 6.4 oz (70 kg)  09/14/23 156 lb (70.8 kg)  09/12/23 153 lb 12.8 oz (69.8 kg)    There is no height or weight on file to calculate BMI.   PHYSICAL EXAM:   Physical Exam Constitutional:      Appearance: Normal appearance.  Cardiovascular:     Rate and Rhythm: Normal rate and regular rhythm.  Pulmonary:     Effort: Pulmonary effort is normal.     Breath sounds: Normal breath sounds.  Abdominal:     General: Bowel sounds  are normal.     Palpations: Abdomen is soft.  Musculoskeletal:        General: No swelling. Normal range of motion.  Neurological:     Mental Status: She is alert and oriented to person, place, and time. Mental status is at baseline.     LABS:      Latest Ref Rng & Units 11/02/2023    1:27 PM 09/14/2023    4:21 PM 08/08/2023    5:14 AM  CBC  WBC 4.0 - 10.5 K/uL 7.6  6.2  7.0   Hemoglobin 12.0 - 15.0 g/dL 16.1  09.6  9.1   Hematocrit 36.0 - 46.0 % 38.6  40.1  28.9   Platelets 150 - 400 K/uL 229  147  125       Latest Ref Rng & Units 11/02/2023    1:27 PM 09/14/2023    4:21 PM 08/08/2023    5:14 AM  CMP  Glucose 70 - 99 mg/dL 045  409  811   BUN 8 - 23 mg/dL 20  23  18   Creatinine 0.44 - 1.00 mg/dL 9.62  9.52  8.41   Sodium 135 - 145 mmol/L 140  141  137   Potassium 3.5 - 5.1 mmol/L 3.5  4.4  4.0   Chloride 98 - 111 mmol/L 112  112  108   CO2 22 - 32 mmol/L 18  17  20    Calcium  8.9 - 10.3 mg/dL 9.1  9.0  8.4   Total Protein 6.5 - 8.1 g/dL 7.1     Total Bilirubin 0.0 - 1.2 mg/dL 0.6     Alkaline Phos 38 - 126 U/L 106     AST 15 - 41 U/L 14     ALT 0 - 44 U/L 11        No results found for: "CEA1", "CEA" / No results found for: "CEA1", "CEA" No results found for: "PSA1" No results found for: "LKG401" No results found for: "CAN125"  Lab Results  Component Value Date   TOTALPROTELP 7.0 10/11/2022   TOTALPROTELP 6.7 10/11/2022   ALBUMINELP 3.8 10/11/2022   A1GS 0.2 10/11/2022   A2GS 0.7 10/11/2022   BETS 1.0 10/11/2022   GAMS 1.3 10/11/2022   MSPIKE Not Observed 10/11/2022   SPEI Comment 10/11/2022   Lab Results  Component Value Date   TIBC 194 (L) 11/02/2023   TIBC 231 (L) 06/22/2023   TIBC 261 03/16/2023   FERRITIN 285 11/02/2023   FERRITIN 266 06/22/2023   FERRITIN 90 03/16/2023   IRONPCTSAT 22 11/02/2023   IRONPCTSAT 25 06/22/2023   IRONPCTSAT 16 03/16/2023   Lab Results  Component Value Date   LDH 157 10/11/2022   LDH 157 05/11/2018     STUDIES:    No results found.     ASSESSMENT & PLAN:   Assessment:  1.  Normocytic anemia: - She had a history of iron  deficiency anemia in the past, status post Injectafer , last given in November 2019. - She had 2 units of blood transfusion during recent hospitalization. - She had tracheotomy done as she cannot tolerate CPAP machine for sleep apnea. - No rectal bleeding.   2.  Social/family history: - She is seen with her daughter [name] who is her caregiver.  Patient walks with the help of walker.  She is able to dress herself.  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  Quit smoking in 1994. - Sister had breast cancer.  Another sister had leukemia.  Maternal uncle had stomach cancer.  Plan:  1.  Normocytic anemia: -Received 1 g Monoferric  on 03/30/2023 with good tolerance. -Labs from 11/02/2023 show ferritin of 285, iron  saturation is 22% with a TIBC of 194.  Hemoglobin 11.8 with normal differential.  Vitamin B12 846.  MMA 162.  Platelet count 229. -Denies any overt bleeding. -B12 level has decreased to 845 -No additional IV iron  needed at this time. -If hemoglobin and platelets continue to trend down despite adequate iron  and B12 levels, would recommend additional workup with possible bone marrow biopsy. -Follow-up in 4 months with labs a few days before and office visit.  2. General health Maintenance: -Mammogram from 10/26/2022 showed BI-RADS Category 0 due to calcifications. -Repeat mammogram from 11/08/2022 was read as BI-RADS Category 1 negative. - Mammogram from 11/09/2023 was read as BI-RADS Category 1-negative.  Repeat in 1 year.  3.  Thrombocytopenia: -Platelet count has been intermittently low over the past year ranging from 110-normal. -  Most recent platelet count from 11/02/2023 was 229. -Had CT abdomen on 04/29/2023 which did not reveal any hepatosplenomegaly.  Patient expressed  understanding of the plan provided.   PLAN SUMMARY: >> Repeat mammogram in May 2026. >> Return to clinic in 4 months with labs a few days before and in office visit.    I spent 20 minutes dedicated to the care of this patient (face-to-face and non-face-to-face) on the date of the encounter to include what is described in the assessment and plan.   Charlton Cooler, NP 11/09/2023 2:26 PM

## 2023-11-10 ENCOUNTER — Other Ambulatory Visit: Payer: Self-pay | Admitting: Family Medicine

## 2023-11-10 ENCOUNTER — Encounter: Payer: Self-pay | Admitting: Gastroenterology

## 2023-11-10 ENCOUNTER — Ambulatory Visit (HOSPITAL_COMMUNITY): Payer: PPO

## 2023-11-10 MED ORDER — LORAZEPAM 1 MG PO TABS: ORAL_TABLET | ORAL | 0 refills | Status: DC

## 2023-11-13 ENCOUNTER — Other Ambulatory Visit: Payer: Self-pay | Admitting: Acute Care

## 2023-11-13 MED ORDER — FLUCONAZOLE 50 MG PO TABS: 150.0000 mg | ORAL_TABLET | Freq: Once | ORAL | 0 refills | Status: AC

## 2023-11-13 NOTE — Progress Notes (Signed)
 Called for vaginal discomfort and itching s/p course of doxy Plan 1 x dose 150mg  diflucan 

## 2023-11-14 ENCOUNTER — Telehealth (HOSPITAL_BASED_OUTPATIENT_CLINIC_OR_DEPARTMENT_OTHER): Payer: Self-pay

## 2023-11-14 NOTE — Telephone Encounter (Signed)
 NFN was completed last night

## 2023-11-14 NOTE — Telephone Encounter (Signed)
 Copied from CRM [id number]. Topic: Clinical - Prescription Issue >> Nov 13, 2023  2:32 PM Hilton Lucky wrote: Reason for CRM: Amy from pharmacy calling to inquire if fluconazole  (DIFLUCAN ) 50 MG tablet can be switched to a single 150mg  for one dose, intead of 3 50mg  tablets for one dose. States they do not stock the 50mg  tablets. Please reach back out to advise and verify the total quantity Amy at [phone].

## 2023-11-17 ENCOUNTER — Other Ambulatory Visit: Payer: Self-pay | Admitting: Family Medicine

## 2023-11-23 ENCOUNTER — Inpatient Hospital Stay (HOSPITAL_COMMUNITY)
Admission: RE | Admit: 2023-11-23 | Discharge: 2023-11-23 | Disposition: A | Discharge status: Home / Self Care | Source: Ambulatory Visit

## 2023-11-24 ENCOUNTER — Ambulatory Visit (HOSPITAL_COMMUNITY)
Admission: RE | Admit: 2023-11-24 | Discharge: 2023-11-24 | Disposition: A | Discharge status: Home / Self Care | Source: Ambulatory Visit | Attending: Acute Care | Admitting: Acute Care

## 2023-11-24 DIAGNOSIS — Z930 Tracheostomy status: Secondary | ICD-10-CM

## 2023-11-24 DIAGNOSIS — J9612 Chronic respiratory failure with hypercapnia: Secondary | ICD-10-CM

## 2023-11-24 DIAGNOSIS — J386 Stenosis of larynx: Secondary | ICD-10-CM | POA: Diagnosis present

## 2023-11-24 DIAGNOSIS — L929 Granulomatous disorder of the skin and subcutaneous tissue, unspecified: Secondary | ICD-10-CM | POA: Diagnosis present

## 2023-11-24 DIAGNOSIS — Z9911 Dependence on respirator [ventilator] status: Secondary | ICD-10-CM

## 2023-11-24 DIAGNOSIS — J9611 Chronic respiratory failure with hypoxia: Secondary | ICD-10-CM | POA: Diagnosis present

## 2023-11-24 DIAGNOSIS — G4733 Obstructive sleep apnea (adult) (pediatric): Secondary | ICD-10-CM | POA: Diagnosis not present

## 2023-11-24 NOTE — Progress Notes (Signed)
 Tracheostomy Procedure Note  VENNESSA AFFINITO [id number] 1950-10-17  Pre Procedure Tracheostomy Information  Trach Brand: Shiley Size: 6.0  [id number] Style: Cuffed Secured by: Velcro   Procedure: Observation of Trach Site    Post Procedure Tracheostomy Information  No Trach change at this visit      Post Procedure Evaluation:  Vital signs:VSS Patients current condition: stable Complications: No apparent complications Trach site exam: healing Wound care done: Other: new bandage place at this visit Patient did tolerate procedure well.   Education: none  Prescription needs: none    Additional needs: New PMV given at this visit

## 2023-11-24 NOTE — Progress Notes (Signed)
 Reason for visit Planned trach change  HPI 73 year old pt well known to me. Follow her for trach dependence for OHS,OSA on nocturnal ventilation. I am seeing her today for planned trach change. Last change was 4/24. Last change was on 5/6 when she presented for evaluation of trach site pain. I had initially treated this as possible localized infection but when she didn't improve I found a small area of granulation between 12 and 1 o'clock trach stoma for which we placed mepiplex dressing for pressure relief. She is here for re-assessment of this site     Recent ENT and additional history  -Referred to ENT May 2023 for subglottis stenosis. subglottic mass removed  -direct laryngoscopy and airway evaluation in September 2023.  Large subglottic posteriorly-based granuloma which was eminating superiorly through the glottis, biopsied and send for permanent pathology; thick circumferential subglottic granulations tissue, which was removed with cupped forceps and coblator  Much improved subglottic airway following the procedure F/u 11/8: had been doing better initially post-op.  he visualized subglottis and proximal trachea are patent and the Shiley tracheostomy tube is visible in the subglottis the trachea is edematous and there does not appear to be much room around the tracheostomy device they recommended fenestrated trach which we placed.  This was subsequently discontinued as the patient did not tolerate it. She has surgery scheduled for 1/23 at [facility] w/ plan for Micro laryngectomy, removal of granulation, tracheal dilation and kenalog injection w/ hope that this will allow her to phonate and tolerate PMV again. We have agreed we do not want to work towards decannulation as she has been intol of BIPAP in past. While waiting I see she has had a f/u echo and c/w 2022 her EF has improved from 45-50% to 55-60%, there is mod LVH, RV fxn mildly reduced, she has severe elevation of PAS (88 mmHg).  1/23 s/p DL  removal of granulation tissue and tracheal dilation. Tolerated well. Speech improved post-operatively.  2/1 thru 2/4 admitted to [facility] for PNA   Review of Systems  All other systems reviewed and are negative.   Exam General 73 year old female walked into clinic today w/push walker.NAD HENT NCAT the small area of granulated tissue at trach site has improved. Not red or irritated Pulm clear Card rrr Abd soft Ext warm and dry    Active Problems:   Tracheostomy status (HCC)   Subglottic stenosis   Granulation tissue w/ associated trach site discomfort.   OSA (obstructive sleep apnea)   Chronic respiratory failure with hypoxia and hypercapnia (HCC)   Dependence on home ventilator (HCC)    Granulation tissue w/ associated trach site discomfort. Overview: Small area of granulation between 12 and 1 o'clock of stoma.  Much improved. Plan Mepiplex dressing (keep in place and change q3d for pressure relief) Will cont the keflex  (already ordered) don't really think infected but could be early infxn  No indication for imaging at this point F/u for routine trach change in few weeks          10 min  Hadley Leu ACNP-BC [facility] Pulmonary/Critical Care Pager # [phone] OR # [phone] if no answer

## 2023-12-01 ENCOUNTER — Encounter: Payer: Self-pay | Admitting: Physician Assistant

## 2023-12-04 ENCOUNTER — Ambulatory Visit: Admitting: Adult Health

## 2023-12-04 ENCOUNTER — Other Ambulatory Visit: Payer: Self-pay | Admitting: Family Medicine

## 2023-12-07 ENCOUNTER — Other Ambulatory Visit: Payer: Self-pay | Admitting: Acute Care

## 2023-12-07 MED ORDER — AMOXICILLIN-POT CLAVULANATE 875-125 MG PO TABS: 1.0000 | ORAL_TABLET | Freq: Two times a day (BID) | ORAL | 0 refills | Status: AC

## 2023-12-07 MED ORDER — FLUCONAZOLE 150 MG PO TABS: 150.0000 mg | ORAL_TABLET | Freq: Once | ORAL | 0 refills | Status: AC

## 2023-12-07 NOTE — Progress Notes (Signed)
 Daughter called.  Pt cough changed. Now w/ some blood tinged sputum. Similar to prior PNA. She is at risk for aspiration.  Plan Augmentin  x 7d  Will provide an as needed diflucan  she seems to be prone to yeast infxn post abx.  Have instructed daughter to notify me if not improving over next 48 hrs   Hadley Leu ACNP-BC [facility] Pulmonary/Critical Care Pager # [phone] OR # [phone] if no answer

## 2023-12-11 ENCOUNTER — Ambulatory Visit: Payer: PPO | Attending: Cardiology | Admitting: Cardiology

## 2023-12-11 ENCOUNTER — Other Ambulatory Visit: Payer: Self-pay | Admitting: Family Medicine

## 2023-12-11 ENCOUNTER — Encounter: Payer: Self-pay | Admitting: Cardiology

## 2023-12-11 VITALS — BP 124/70 | HR 74 | Ht 66.0 in | Wt 155.2 lb

## 2023-12-11 DIAGNOSIS — I5032 Chronic diastolic (congestive) heart failure: Secondary | ICD-10-CM | POA: Diagnosis not present

## 2023-12-11 DIAGNOSIS — I4811 Longstanding persistent atrial fibrillation: Secondary | ICD-10-CM | POA: Diagnosis not present

## 2023-12-11 NOTE — Progress Notes (Signed)
 Clinical Summary Ms. Larzelere is a 73 y.o.female seen today for follow up of the following medical problems.      1. Chronic systolic diastolic CHF with mixed precap and post postcap Pulmonary Hypertension - echo 12/2013 PASP 85, moderate TR, RV mild to moderately dilated with normal function, could not eval diasotlic function due to afib, LVEF 50-55%. Biatrial enlargement.   - RHC 01/2015 PA 38/13 and calculated mean of 21, could not get a wedge however LVEDP 15. - repeat echo 10/2014 PASP 61 mmHg (down from 85 on prior echo), some evidence of RV dysfunction with RV TAPSE 1.4 and tissue anular systolic velocity of 7.5. Cannot evaluate diastolic function, however severe biatrial enlarement suggests significant dysfunction, - 01/2015 RHC mean PA 52, PCWP 31 - 01/2015 echo LVEF 55-60%, PASP 46   - after most recent RHC in 01/2015 she was admitted for diuresis. Discharge weight 170 lbs, reportedly down from 190 on admission. Aaron Aas Discharged on lasix  60mg  bid.       08/17/2020 echo [facility] LVEF 15-20% 08/20/2020 30 [facility]% 08/29/2020 [facility] LVEF 50-55%  -01/2021 45-50%     - mild edema at times, R>L which is her chronic pattern. Weight is down from 182 lbs last year, to 155 lbs today.  - taking lasix  prn, on average takes 20mg  every other day - chronic SOB overall stable.    2. Persistent Afib - rare palpitations - compliant with meds, some occasonal small amounts of blood in stool. They have reached out to GI. Hgb last month was 11.8        3/Chronic resp failure - has trach, O2 collar during the day and vent at night    Past Medical History:  Diagnosis Date   Abnormal pulmonary function test    Anemia    H/H of 10/30 with a normal MCV in 12/09   Anxiety    Arthritis    Atrial fibrillation (HCC)    Barrett's esophagus    Diagnosed 1995. Last EGD 2016-NO BARRETT'S.    Chest pain    a. 2022 Cath: nl cors; b. 2008 Neg myoview; c. 01/2014 Cath: Nl cors. EF 40%; c. 08/2020 NSTEMI/Cath  [facility]): nl cors.   Chronic anticoagulation    Chronic combined systolic and diastolic CHF (congestive heart failure) (HCC)    a. 10/2014 Echo: EF40%; b. 01/2015 Echo: EF 55-60%; c. 11/2020 Echo: EF 55-60%, mod LVH, sev BAE, mild MR, mod TR. PASP ; d. 01/2021 Echo: EF 45-50%, glob HK, mod conc LVH, mod reduced RV fxn, RVSP 60.30mmHg, Mod TR, mild to mod MR, sev dil LA.   Chronic LBP    Surgical intervention in 1996   Diabetes mellitus, type 2 (HCC)    Insulin  therapy; exacerbated by prednisone    Gastroparesis    99% retention 05/2008 on GES   GERD (gastroesophageal reflux disease)    Heart attack (HCC) 08/13/2020   Hiatal hernia    Hyperlipidemia    Hypertension    Hypothyroid    IBS (irritable bowel syndrome)    Obesity    OSA on CPAP    had CPAP and cannot tolerate.   Paroxysmal atrial fibrillation (HCC)    a.CHA2DS2VASc = 6-->eliquis . Also on Amio.   Pulmonary hypertension (HCC) 01/2015   a. 01/2015 RHC: Predominantly pulmonary venous hypertension but may be component of PAH (PA mean 52, PCWP 31); b. 01/2021 Echo: RVSP 60.66mmHg w/ mod reduced RV fxn.   Seizures (HCC)    last  seizure was 2 years ago; on keppra  for this; unknown etiology   Syncope    a. Admitted 05/2009; magnetic resonance imagin/ MRA - negative; etiology thought to be orthostasis secondary to drugs and dehydration. b. Syncope 02/2015 also felt 2/2 dehydration.     Allergies  Allergen Reactions   Codeine Nausea And Vomiting and Other (See Comments)    Hallucinations   Dilaudid  [Hydromorphone  Hcl] Other (See Comments)    Syncope    Reglan  [Metoclopramide ] Other (See Comments)    Dyskinesia    Fentanyl      Other Reaction(s): Not available   Levsin [Hyoscyamine ] Diarrhea    Caused worse diarrhea     Current Outpatient Medications  Medication Sig Dispense Refill   amoxicillin -clavulanate (AUGMENTIN ) 875-125 MG tablet Take 1 tablet by mouth 2 (two) times daily for 7 days. 14 tablet 0   acetaminophen   (TYLENOL ) 650 MG CR tablet Take 1,300 mg by mouth 2 (two) times daily.     atorvastatin  (LIPITOR ) 40 MG tablet Take 1 tablet (40 mg total) by mouth daily. 90 tablet 3   busPIRone  (BUSPAR ) 30 MG tablet Take 1 tablet (30 mg total) by mouth in the morning and at bedtime. 60 tablet 3   Cholecalciferol  (VITAMIN D -3 PO) Take 1 tablet by mouth daily in the afternoon.     Continuous Glucose Sensor (FREESTYLE LIBRE 2 SENSOR) MISC FOR CONTINUOUS GLUCOSE MONITORING. 2 each 11   Continuous Glucose Sensor (FREESTYLE LIBRE 2 SENSOR) MISC Use daily 1 each 3   Continuous Glucose Sensor (FREESTYLE LIBRE 2 SENSOR) MISC APPLY ONE SENSOR TO THE SKIN EVERY 14 DAYS AS DIRECTED TO CONTINUOUSLY MONITOR GLUCOSE LEVELS 3 each 2   dicyclomine  (BENTYL ) 10 MG capsule TAKE 1 CAPSULE BY MOUTH UP TO 4 TIMES DAILY BEFORE MEALS AND AT BEDTIME (STOP IF CONSTIPATION). 120 capsule 3   donepezil  (ARICEPT ) 5 MG tablet TAKE 1 TABLET BY MOUTH AT BEDTIME FOR dementia 90 tablet 3   ELIQUIS  5 MG TABS tablet TAKE 1 TABLET BY MOUTH TWICE DAILY AS A BLOOD THINNER 180 tablet 3   furosemide  (LASIX ) 20 MG tablet Take 1 tablet (20 mg total) by mouth daily. 100 tablet 3   gabapentin  (NEURONTIN ) 100 MG capsule TAKE ONE CAPSULE BY MOUTH THREE TIMES DAILY FOR NEUROPATHIC PAIN 270 capsule 3   insulin  aspart (NOVOLOG  FLEXPEN) 100 UNIT/ML FlexPen Inject 0-8 Units into the skin 3 (three) times daily with meals. 15 mL 4   insulin  glargine (LANTUS  SOLOSTAR) 100 UNIT/ML Solostar Pen Inject 20 Units into the skin at bedtime. 15 mL 4   ipratropium-albuterol  (DUONEB) 0.5-2.5 (3) MG/3ML SOLN Take 3 mLs by nebulization every 4 (four) hours as needed (Asthma).     JARDIANCE  10 MG TABS tablet TAKE ONE TABLET BY MOUTH EVERY DAY. 30 tablet 2   levETIRAcetam  (KEPPRA ) 500 MG tablet TAKE 1 TABLET BY MOUTH TWICE DAILY FOR SEIZURE PREVENTION 180 tablet 3   levothyroxine  (SYNTHROID ) 175 MCG tablet Take 175 mcg by mouth daily before breakfast.     loperamide  (IMODIUM ) 2 MG  capsule Take 2 mg by mouth every 8 (eight) hours as needed for diarrhea or loose stools.     LORazepam  (ATIVAN ) 1 MG tablet Take 1 to 1 1/2 tablets once daily as needed for anxiety or to aid with sleep 30 tablet 0   metoprolol  tartrate (LOPRESSOR ) 25 MG tablet TAKE 1 TABLET BY MOUTH TWICE DAILY FOR HIGH BLOOD PRESSURE OR HEART RATE CONTROL 180 tablet 3   nitroGLYCERIN  (NITROSTAT ) 0.4  MG SL tablet Place 0.4 mg under the tongue every 5 (five) minutes as needed for chest pain.     pantoprazole  (PROTONIX ) 40 MG tablet TAKE 1 TABLET BY MOUTH TWICE DAILY FOR acid reflux 180 tablet 3   potassium chloride  (KLOR-CON  M) 10 MEQ tablet TAKE 2 TABLETS BY MOUTH DAILY TO REPLACE POTASSIUM. 60 tablet 2   sertraline  (ZOLOFT ) 50 MG tablet Take 1 tablet (50 mg total) by mouth daily. 30 tablet 3   traMADol  (ULTRAM ) 50 MG tablet Take 1 tablet (50 mg total) by mouth 3 (three) times daily as needed. 90 tablet 0   No current facility-administered medications for this visit.     Past Surgical History:  Procedure Laterality Date   BACK SURGERY  1996   BIOPSY N/A 11/08/2013   Procedure: BIOPSY  / Tissue sampling / ulcers present in small intestine;  Surgeon: Alyce Jubilee, MD;  Location: [facility];  Service: Endoscopy;  Laterality: N/A;   CARDIAC CATHETERIZATION  2002   CARDIAC CATHETERIZATION N/A 01/26/2015   Procedure: Right Heart Cath;  Surgeon: Darlis Eisenmenger, MD;  Location: [facility] INVASIVE CV LAB;  Service: Cardiovascular;  Laterality: N/A;   CARDIOVASCULAR STRESS TEST  2008   Stress nuclear study   CARDIOVERSION N/A 03/06/2015   Procedure: CARDIOVERSION;  Surgeon: Darlis Eisenmenger, MD;  Location: [facility] ENDOSCOPY;  Service: Cardiovascular;  Laterality: N/A;   CARPAL TUNNEL RELEASE  1994   CATARACT EXTRACTION W/PHACO Right 01/10/2022   Procedure: CATARACT EXTRACTION PHACO AND INTRAOCULAR LENS PLACEMENT (IOC);  Surgeon: Tarri Farm, MD;  Location: [facility] ORS;  Service: Ophthalmology;  Laterality: Right;  CDE: 21.51    CATARACT EXTRACTION W/PHACO Left 01/24/2022   Procedure: CATARACT EXTRACTION PHACO AND INTRAOCULAR LENS PLACEMENT (IOC);  Surgeon: Tarri Farm, MD;  Location: [facility] ORS;  Service: Ophthalmology;  Laterality: Left;  CDE: 17.13   COLONOSCOPY  11/2011   Dr. Nolene Baumgarten: Internal hemorrhoids, mild diverticulosis. Random colon biopsies negative.   COLONOSCOPY N/A 11/08/2013   SLF: Normal mucosa in the terminal ileum/The colon IS redundant/  Moderate diverticulosis throughout the entire colon. ileum bx benign. colon bx benign   CRYOTHERAPY  01/23/2021   Procedure: CRYOTHERAPY;  Surgeon: Josiah Nigh, MD;  Location: [facility] ENDOSCOPY;  Service: Pulmonary;;   CRYOTHERAPY  01/24/2021   Procedure: Consuela Denier;  Surgeon: Josiah Nigh, MD;  Location: [facility] ENDOSCOPY;  Service: Pulmonary;;   CRYOTHERAPY  01/26/2021   Procedure: Consuela Denier;  Surgeon: Marine Sia, MD;  Location: [facility] ENDOSCOPY;  Service: Cardiopulmonary;;   ESOPHAGOGASTRODUODENOSCOPY  2008   Barrett's without dysplasia. esphagus dilated. antral erosions, h.pylori serologies negative.   ESOPHAGOGASTRODUODENOSCOPY  11/2011   Dr. Nolene Baumgarten: Barrett's esophagus, mild gastritis, diverticulum in the second portion of the duodenum repeat EGD 3 years. Small bowel biopsies negative. Gastric biopsy show reactive gastropathy but no H. pylori. Esophageal biopsies consistent with GERD. Next EGD 11/2014   ESOPHAGOGASTRODUODENOSCOPY N/A 11/21/2014   ZOX:WRUE non-erosive gastritis/irregular z-line   ESOPHAGOGASTRODUODENOSCOPY  03/2022   FOREIGN BODY REMOVAL  01/23/2021   Procedure: FOREIGN BODY REMOVAL;  Surgeon: Josiah Nigh, MD;  Location: [facility] ENDOSCOPY;  Service: Pulmonary;;   FOREIGN BODY REMOVAL  01/24/2021   Procedure: FOREIGN BODY REMOVAL;  Surgeon: Josiah Nigh, MD;  Location: [facility] ENDOSCOPY;  Service: Pulmonary;;   GIVENS CAPSULE STUDY  12/07/2011   Proximal small bowel, rare AVM. Distal small bowel, multiple ulcers noted   GIVENS CAPSULE STUDY  N/A 09/27/2013   Distal small bowel ulcers extending to TI.  GIVENS CAPSULE STUDY N/A 10/10/2013   Procedure: GIVENS CAPSULE STUDY;  Surgeon: Alyce Jubilee, MD;  [facility];  Service: Endoscopy;  Laterality: N/A;  7:30   HEMOSTASIS CONTROL  01/24/2021   Procedure: HEMOSTASIS CONTROL;  Surgeon: Josiah Nigh, MD;  Location: [facility] ENDOSCOPY;  Service: Pulmonary;;   HEMOSTASIS CONTROL  01/26/2021   Procedure: HEMOSTASIS CONTROL;  Surgeon: Marine Sia, MD;  Location: [facility] ENDOSCOPY;  Service: Cardiopulmonary;;   IR GASTROSTOMY TUBE MOD SED  01/11/2021   IR GASTROSTOMY TUBE REMOVAL  04/13/2021   IR KYPHO THORACIC WITH BONE BIOPSY  02/09/2018   KNEE ARTHROSCOPY WITH MEDIAL MENISECTOMY Right 06/09/2016   Procedure: KNEE ARTHROSCOPY WITH MEDIAL MENISECTOMY;  Surgeon: Darrin Emerald, MD;  Location: [facility] ORS;  Service: Orthopedics;  Laterality: Right;  medial and lateral menisectomy   LAMINECTOMY  1995   L4-L5   LAPAROSCOPIC CHOLECYSTECTOMY  1990s   LEFT HEART CATHETERIZATION WITH CORONARY ANGIOGRAM  01/10/2014   Procedure: LEFT HEART CATHETERIZATION WITH CORONARY ANGIOGRAM;  Surgeon: Mickiel Albany, MD;  Location: [facility] CATH LAB;  Service: Cardiovascular;;   MICROLARYNGOSCOPY WITH LASER AND BALLOON DILATION N/A 11/17/2021   Procedure: MICROLARYNGOSCOPY WITH CO2 LASER, SUBGLOTTIC LARYNGEAL MASS BIOSPY;  Surgeon: Janita Mellow, MD;  Location: [facility] OR;  Service: ENT;  Laterality: N/A;   RIGHT HEART CATHETERIZATION N/A 01/10/2014   Procedure: RIGHT HEART CATH;  Surgeon: Mickiel Albany, MD;  Location: [facility] CATH LAB;  Service: Cardiovascular;  Laterality: N/A;   TOTAL ABDOMINAL HYSTERECTOMY  1999   TRACHEOSTOMY REVISION N/A 07/18/2021   Procedure: TRACHEOSTOMY REVISION;  Surgeon: Vernadine Golas, MD;  Location: [facility] OR;  Service: ENT;  Laterality: N/A;   TRACHEOSTOMY TUBE PLACEMENT N/A 09/01/2017   Procedure: TRACHEOSTOMY;  Surgeon: Prescott Brodie, MD;  Location: [facility] OR;  Service: ENT;   Laterality: N/A;   VIDEO BRONCHOSCOPY Bilateral 01/23/2021   Procedure: VIDEO BRONCHOSCOPY WITHOUT FLUORO;  Surgeon: Josiah Nigh, MD;  Location: [facility] ENDOSCOPY;  Service: Pulmonary;  Laterality: Bilateral;   VIDEO BRONCHOSCOPY Bilateral 01/24/2021   Procedure: VIDEO BRONCHOSCOPY WITHOUT FLUORO;  Surgeon: Josiah Nigh, MD;  Location: [facility] ENDOSCOPY;  Service: Pulmonary;  Laterality: Bilateral;   VIDEO BRONCHOSCOPY N/A 01/26/2021   Procedure: VIDEO BRONCHOSCOPY WITHOUT FLUORO;  Surgeon: Marine Sia, MD;  Location: [facility] ENDOSCOPY;  Service: Cardiopulmonary;  Laterality: N/A;     Allergies  Allergen Reactions   Codeine Nausea And Vomiting and Other (See Comments)    Hallucinations   Dilaudid  [Hydromorphone  Hcl] Other (See Comments)    Syncope    Reglan  [Metoclopramide ] Other (See Comments)    Dyskinesia    Fentanyl      Other Reaction(s): Not available   Levsin [Hyoscyamine ] Diarrhea    Caused worse diarrhea      Family History  Problem Relation Age of Onset   Hypertension Mother    Alzheimer's disease Mother    Stroke Mother    Heart attack Mother    Breast cancer Sister    Hypertension Other    Heart disease Neg Hx    Colon cancer Neg Hx    Liver disease Neg Hx      Social History Ms. Riemenschneider reports that she quit smoking about 30 years ago. Her smoking use included cigarettes. She started smoking about 57 years ago. She has a 6.8 pack-year smoking history. She has never used smokeless tobacco. Ms. Freund reports that she does not currently use alcohol.    Physical Examination Today's  Vitals   12/11/23 1443  BP: 124/70  Pulse: 74  SpO2: 94%  Weight: 155 lb 3.2 oz (70.4 kg)  Height: 5\' 6"  (1.676 m)  PainSc: 8   PainLoc: Throat   Body mass index is 25.05 kg/m.  Gen: resting comfortably, no acute distress HEENT: no scleral icterus, pupils equal round and reactive, no palptable cervical adenopathy,  CV: RRR, no mrg, no jvd Resp: Clear to auscultation  bilaterally GI: abdomen is soft, non-tender, non-distended, normal bowel sounds, no hepatosplenomegaly MSK: extremities are warm, no edema.  Skin: warm, no rash Neuro:  no focal deficits Psych: appropriate affect   Diagnostic Studies  08/2020 cath Findings:  1. No significant coronary artery disease.  2. Elevated right and left ventricular filling pressures (LVEDP = 27 mm  Hg).  3. Severe pulmonary hypertension      08/2020 echo Summary    1. Echo contrast utilized to enhance endocardial border definition.    2. The left ventricle is normal in size with mildly increased wall  thickness.    3. The left ventricular systolic function is severely decreased, LVEF is  visually estimated at 15-20%.    4. The left atrium is moderately dilated in size.    5. There is moderate tricuspid regurgitation.    6. There is severe pulmonary hypertension, estimated pulmonary artery  systolic pressure is 90 mmHg.    7. The right atrium is mildly to moderately dilated  in size.    8. IVC size and inspiratory change suggest mildly elevated right atrial  pressure. (5-10 mmHg).      08/2020 Summary    1. The left ventricle is upper normal in size with mildly increased wall  thickness.    2. The left ventricular systolic function is severely decreased, LVEF is  visually estimated at 30%.    3. There is severe hypokinesis of the apical left ventricular segments with  normal contractile function of the basilar segments, consistent with a stress  cardiomyopathy.    4. Mitral annular calcification is present (severe).    5. The mitral valve leaflets are mildly thickened with normal leaflet  mobility.    6. There is mild mitral valve regurgitation.    7. The aortic valve is probably trileaflet with mildly thickened leaflets  with normal excursion.    8. The left atrium is moderately dilated in size.    9. The right ventricle is upper normal in size, with normal systolic  function.    10. The right  atrium is mildly dilated  in size.      Assessment and Plan  1. Chronic diastolic heart failure - euvolemic today, significant weight loss over the last year - contniue current diuretic.    2. Afib/acquired thrombophilia - failed amio, on just rate control - no symptoms, continue current meds including eliquis  for stroke prevention.       Laurann Pollock, M.D.

## 2023-12-11 NOTE — Patient Instructions (Signed)
 Medication Instructions:  Your physician recommends that you continue on your current medications as directed. Please refer to the Current Medication list given to you today.   Labwork: None today  Testing/Procedures: None today  Follow-Up: 6 months  Any Other Special Instructions Will Be Listed Below (If Applicable).  If you need a refill on your cardiac medications before your next appointment, please call your pharmacy.

## 2023-12-12 ENCOUNTER — Ambulatory Visit: Payer: Self-pay

## 2023-12-12 NOTE — Telephone Encounter (Signed)
 FYI Only or Action Required?: Action required by provider  Patient was last seen in primary care on 09/14/2023 by Del Abron Abt, FNP. Called Nurse Triage reporting Hematuria. Symptoms began several days ago. Interventions attempted: Rest, hydration, or home remedies. Symptoms are: stable.  Triage Disposition: See PCP When Office is Open (Within 3 Days), See HCP Within 4 Hours (Or PCP Triage)  Patient/caregiver understands and will follow disposition?: Yes, but will wait Copied from CRM [id number]. Topic: Clinical - Red Word Triage >> Dec 12, 2023 11:43 AM [name] wrote: Red Word that prompted transfer to Nurse Triage: Urinating blood, patient first noticed it when she peed yesterday. When she wipes nothing is there but there is blood when in her urine she pees. She is on an antibiotic since last week thus/Friday. Patient thinks she is also getting thrush. Reason for Disposition  Taking Coumadin  (warfarin) or other strong blood thinner, or known bleeding disorder (e.g., thrombocytopenia)  [1] White patches that stick to tongue or inner cheek AND [2] can be wiped off  Answer Assessment - Initial Assessment Questions 1. COLOR of URINE: "Describe the color of the urine."  (e.g., tea-colored, pink, red, bloody) "Do you have blood clots in your urine?" (e.g., none, pea, grape, small coin)     Pink, no clots. No blood on clothing or between voids.  2. ONSET: "When did the bleeding start?"      Sunday 3. EPISODES: "How many times has there been blood in the urine?" or "How many times today?"     Each void 4. PAIN with URINATION: "Is there any pain with passing your urine?" If Yes, ask: "How bad is the pain?"  (Scale 1-10; or mild, moderate, severe)    - MILD: Complains slightly about urination hurting.    - MODERATE: Interferes with normal activities.      - SEVERE: Excruciating, unwilling or unable to urinate because of the pain.      mild 5. FEVER: "Do you have a fever?" If Yes, ask:  "What is your temperature, how was it measured, and when did it start?"     Denies 6. ASSOCIATED SYMPTOMS: "Are you passing urine more frequently than usual?"     Incontinent at baseline 7. OTHER SYMPTOMS: "Do you have any other symptoms?" (e.g., back/flank pain, abdomen pain, vomiting)     Possible thrush, has white patches on tongue-requests diflucan   Additional info: on Augementin abx for trach site was bleeding with increased secretions, this has resolved, 3 days remain of antibiotic. Held eliquis  last night but took this morning due to forgetting to hold.  Answer Assessment - Initial Assessment Questions 1. SYMPTOM: "What's the main symptom you're concerned about?" (e.g., chapped lips, dry mouth, lump, sores)     Thrush, white patches 2. ONSET: "When did the  white patches  start?"     Today 3. PAIN: "Is there any pain?" If Yes, ask: "How bad is it?" (Scale: 1-10; mild, moderate, severe)   - MILD (1-3):  doesn't interfere with eating or normal activities   - MODERATE (4-7): interferes with eating some solids and normal activities   - SEVERE (8-10):  excruciating pain, interferes with most normal activities   - SEVERE DYSPHAGIA: can't swallow liquids, drooling     Mild 4. CAUSE: "What do you think is causing the symptoms?"     antibiotics 5. OTHER SYMPTOMS: "Do you have any other symptoms?" (e.g., fever, sore throat, toothache, swelling) no  Protocols used: Urine - Blood In-A-AH,  Mouth Symptoms-A-AH

## 2023-12-12 NOTE — Telephone Encounter (Signed)
 Patient scheduled 12/14/2023

## 2023-12-13 ENCOUNTER — Ambulatory Visit (HOSPITAL_COMMUNITY)
Admission: RE | Admit: 2023-12-13 | Discharge: 2023-12-13 | Disposition: A | Discharge status: Home / Self Care | Source: Ambulatory Visit | Attending: Acute Care | Admitting: Acute Care

## 2023-12-13 ENCOUNTER — Other Ambulatory Visit: Payer: Self-pay | Admitting: Acute Care

## 2023-12-13 DIAGNOSIS — L929 Granulomatous disorder of the skin and subcutaneous tissue, unspecified: Secondary | ICD-10-CM | POA: Diagnosis present

## 2023-12-13 DIAGNOSIS — J386 Stenosis of larynx: Secondary | ICD-10-CM | POA: Diagnosis present

## 2023-12-13 DIAGNOSIS — Z930 Tracheostomy status: Secondary | ICD-10-CM | POA: Insufficient documentation

## 2023-12-13 DIAGNOSIS — J3489 Other specified disorders of nose and nasal sinuses: Secondary | ICD-10-CM | POA: Diagnosis present

## 2023-12-13 DIAGNOSIS — I2723 Pulmonary hypertension due to lung diseases and hypoxia: Secondary | ICD-10-CM | POA: Diagnosis present

## 2023-12-13 DIAGNOSIS — K3184 Gastroparesis: Secondary | ICD-10-CM | POA: Diagnosis present

## 2023-12-13 DIAGNOSIS — Z9911 Dependence on respirator [ventilator] status: Secondary | ICD-10-CM

## 2023-12-13 DIAGNOSIS — E662 Morbid (severe) obesity with alveolar hypoventilation: Secondary | ICD-10-CM | POA: Diagnosis present

## 2023-12-13 DIAGNOSIS — K219 Gastro-esophageal reflux disease without esophagitis: Secondary | ICD-10-CM | POA: Diagnosis present

## 2023-12-13 DIAGNOSIS — G4733 Obstructive sleep apnea (adult) (pediatric): Secondary | ICD-10-CM | POA: Diagnosis present

## 2023-12-13 DIAGNOSIS — J9611 Chronic respiratory failure with hypoxia: Secondary | ICD-10-CM | POA: Diagnosis present

## 2023-12-13 MED ORDER — IPRATROPIUM BROMIDE 0.03 % NA SOLN: 2.0000 | Freq: Three times a day (TID) | NASAL | 12 refills | Status: AC | PRN

## 2023-12-13 NOTE — Progress Notes (Signed)
 Reason for visit Planned trach change  HPI 73 year old pt well known to me. Follow her for trach dependence for OHS,OSA on nocturnal ventilation. I am seeing her today for planned trach change.  Last change was on 5/6, I saw her Again on 5/23 to reassess tracheostomy site which was improving significantly and comfort after adding Band-Aid to relieve some pressure.  She recently contacted me on 6/5 with increased cough, feeling poorly, felt similar to when she had a pneumonia.  Because of this I sent her prescription for Augmentin .  She did begin to feel better from a pulmonary standpoint but then developed some hematuria.  She had yeast infections in the past, following antibiotics so we chose to empirically treat with fluconazole  and had her hold 1 dose of her anticoagulation. Daughter reports no further hematuria.  She does have a follow-up with her primary care provider about this Presents today for planned tracheostomy change, does report fairly significant nasal drainage particularly in the morning when getting up, thinks this is contributing to occasional cough.    Recent ENT and additional history  -Referred to ENT May 2023 for subglottis stenosis. subglottic mass removed  -direct laryngoscopy and airway evaluation in September 2023.  Large subglottic posteriorly-based granuloma which was eminating superiorly through the glottis, biopsied and send for permanent pathology; thick circumferential subglottic granulations tissue, which was removed with cupped forceps and coblator  Much improved subglottic airway following the procedure F/u 11/8: had been doing better initially post-op.  he visualized subglottis and proximal trachea are patent and the Shiley tracheostomy tube is visible in the subglottis the trachea is edematous and there does not appear to be much room around the tracheostomy device they recommended fenestrated trach which we placed.  This was subsequently discontinued as the patient  did not tolerate it. She has surgery scheduled for 1/23 at [facility] w/ plan for Micro laryngectomy, removal of granulation, tracheal dilation and kenalog injection w/ hope that this will allow her to phonate and tolerate PMV again. We have agreed we do not want to work towards decannulation as she has been intol of BIPAP in past. While waiting I see she has had a f/u echo and c/w 2022 her EF has improved from 45-50% to 55-60%, there is mod LVH, RV fxn mildly reduced, she has severe elevation of PAS (88 mmHg).  1/23 s/p DL removal of granulation tissue and tracheal dilation. Tolerated well. Speech improved post-operatively.  2/1 thru 2/4 admitted to [facility] for PNA   Review of Systems  Constitutional:  Positive for malaise/fatigue. Negative for fever.  HENT:  Positive for congestion.        Has fairly significant rhinorrhea particularly in the morning, and during hours of sleep.  The patient sleeps on 1 pillow, her head is not typically elevated  Eyes: Negative.   Respiratory:  Positive for cough. Negative for sputum production and shortness of breath.   Cardiovascular:  Negative for chest pain.  Gastrointestinal: Negative.   Genitourinary:  Positive for hematuria.  Musculoskeletal: Negative.   Skin: Negative.   Endo/Heme/Allergies: Negative.   All other systems reviewed and are negative.   Exam General 73 year old female patient well-known to me presents to the tracheostomy clinic today for planned tracheostomy change she is in no acute distress today HEENT normocephalic atraumatic.  Speech quality about the same, doing better using her PMV voice quality remains raspy but again improving in regards to timing of talking during exhale phase Pulmonary some scattered rhonchi that  clear with cough Cardiac regular rate and rhythm Abdomen soft nontender Extremities warm dry   Active Problems:   Tracheostomy status (HCC)   Subglottic stenosis   Granulation tissue w/ associated trach site  discomfort.   OSA (obstructive sleep apnea)   Chronic respiratory failure with hypoxia and hypercapnia (HCC)   Dependence on home ventilator (HCC)   Obesity hypoventilation syndrome (HCC)   WHO group 3 pulmonary arterial hypertension (HCC)   Gastroesophageal reflux disease   Gastroparesis    Rhinorrhea Overview: Multiple possible contributing factors.  She does have fairly significant gastroparesis, and reflux.  She only sleeps with 1 pillow so certainly if she is refluxing at night that could result in increased nasal discharge.  She is already on a oral antihistamine Plan I have instructed her to sleep with her bed elevated, she has a hospital bed at home so we will start elevating the head of the bed Added nasal ipratropium to see if this helps with increased rhinorrhea, she is already on a nasal steroid as well as antihistamine orally   Tracheostomy status (HCC) Overview:  Trach Brand: Shiley Size:  6.0 6CN75R Style: Cuffed Secured by: Velcro  Last change today 6/11 prior to that was 5/6  Discussion Doing well  Trach stable. Not candidate for removal d/t need for nocturnal ventilation and intolerance of CPAP ALSO on-going issues w/ SGS  Plan Continue routine trach care Return office visit 4 weeks for planned trach change Continue nocturnal ventilation    Subglottic stenosis Overview:  better s/p surgical removal of granulation tissue and tracheal dilation she now uses the PMV during daytime, and uses it much more frequently.  Tolerating this well.  Had some challenging in regards to timing of phonation, this seems better Plan Continue to encourage PMV as tolerated          22 min  [name] ACNP-BC [facility] Pulmonary/Critical Care Pager # [phone] OR # [phone] if no answer

## 2023-12-13 NOTE — Progress Notes (Signed)
 Tracheostomy Procedure Note  [name] [id number] [birth year]  Pre Procedure Tracheostomy Information  Trach Brand: Shiley Size: #6.0 [id number] Style: Cuffed Secured by: Velcro   Procedure: Trach change/education    Post Procedure Tracheostomy Information  Trach Brand: Shiley Size: #6.0 [id number] Style: Cuffed Secured by: Velcro   Post Procedure Evaluation:  ETCO2 positive color change from yellow to purple : Yes.   Vital signs: stable throughout Patients current condition: stable Complications: No apparent complications Trach site exam: clean, dry, no drainage Wound care done: dry, saline soaked, sterile, clean, and 4 x 4 gauze Patient did tolerate procedure well.   Education: Given by Burna Carrier, CCM-NP.  Prescription needs: Discussed with Burna Carrier, CCM-NP    Additional needs: PMV given.

## 2023-12-14 ENCOUNTER — Ambulatory Visit: Payer: Self-pay | Admitting: Family Medicine

## 2023-12-14 VITALS — BP 114/70 | HR 80 | Resp 18 | Ht 66.0 in | Wt 141.0 lb

## 2023-12-14 DIAGNOSIS — R319 Hematuria, unspecified: Secondary | ICD-10-CM | POA: Diagnosis not present

## 2023-12-14 DIAGNOSIS — M5030 Other cervical disc degeneration, unspecified cervical region: Secondary | ICD-10-CM

## 2023-12-14 DIAGNOSIS — B370 Candidal stomatitis: Secondary | ICD-10-CM

## 2023-12-14 MED ORDER — NYSTATIN 100000 UNIT/ML MT SUSP: 5.0000 mL | Freq: Four times a day (QID) | OROMUCOSAL | 1 refills | Status: DC

## 2023-12-14 NOTE — Patient Instructions (Addendum)
        Great to see you today.  I have refilled the medication(s) we provide.   If labs were collected, we will inform you of lab results once received either by echart message or telephone call.   - echart message- for normal results that have been seen by the patient already.   - telephone call: abnormal results or if patient has not viewed results in their echart.   - Please take medications as prescribed. - Follow up with your primary health provider if any health concerns arises. - If symptoms worsen please contact your primary care provider and/or visit the emergency department.    Nystatin  (Mycostatin ) - Antifungal Mouthwash Form: Oral suspension (swish and swallow)  Dose: 4-6 mL 4 times daily  Instructions: Swish in mouth for several minutes, then swallow. Avoid eating or drinking for 30 minutes after.

## 2023-12-14 NOTE — Assessment & Plan Note (Signed)
 During today's visit, patient's urine was noted to be amber in color, previously described as tea-colored. No visible clots were observed.   Urinalysis, urine culture, and CBC have been ordered to further evaluate the cause of hematuria.  Patient is advised to increase daily water  intake to 2-3 liters and to limit consumption of caffeine and sugary beverages.  A referral to urology has been placed for further evaluation and management. Noted use of Eliquis  (apixaban ) as a significant risk factor for bleeding. Will continue unless otherwise directed by the managing provider. Coordination with prescribing provider and/or cardiology may be considered if hematuria worsens.

## 2023-12-14 NOTE — Progress Notes (Signed)
 Established Patient Office Visit   Subjective  Patient ID: [name], female    DOB: 1950-08-06  Age: 73 y.o. MRN: [id number]  Chief Complaint  Patient presents with   Hematuria    First noticed Sunday; started Augmentin  late last week for poss respiratory infection and has followed with Diflucan  for possible yeast infection; still seeing blood in urine today   Thrush   Fatigue   Back Pain    Current tramadol  dose is not helping    She  has a past medical history of Abnormal pulmonary function test, Anemia, Anxiety, Arthritis, Atrial fibrillation (HCC), Barrett's esophagus, Chest pain, Chronic anticoagulation, Chronic combined systolic and diastolic CHF (congestive heart failure) (HCC), Chronic LBP, Diabetes mellitus, type 2 (HCC), Gastroparesis, GERD (gastroesophageal reflux disease), Heart attack (HCC) (08/13/2020), Hiatal hernia, Hyperlipidemia, Hypertension, Hypothyroid, IBS (irritable bowel syndrome), Obesity, OSA on CPAP, Paroxysmal atrial fibrillation (HCC), Pulmonary hypertension (HCC) (01/2015), Seizures (HCC), and Syncope.  HPI Hematuria  The patient initially noted the onset of gross hematuria on Sunday night, which has been intermittent since then. The urine is now described as amber in color, previously appearing tea-colored, with no visible clots and no associated pain (pain rated 0/10). She reports urinary frequency as an irritative symptom. There are no obstructive symptoms such as dribbling. Mild abdominal and flank discomfort are present. She denies bladder pain, dysuria, or urinary retention. The patient is not sexually active and has no history of kidney stones. A notable risk factor is her current use of Eliquis  (apixaban ).     Review of Systems  Constitutional:  Negative for chills, fever and malaise/fatigue.  Cardiovascular:  Negative for chest pain.  Gastrointestinal:  Positive for abdominal pain.  Genitourinary:  Positive for flank pain, frequency and  hematuria.  Musculoskeletal:  Positive for joint pain and myalgias.  Neurological:  Negative for dizziness and headaches.      Objective:     BP 114/70 (BP Location: Left Arm, Patient Position: Sitting)   Pulse 80   Resp 18   Ht 5' 6 (1.676 m)   Wt 141 lb (64 kg)   SpO2 91%   BMI 22.76 kg/m  BP Readings from Last 3 Encounters:  12/14/23 114/70  12/11/23 124/70  11/09/23 106/67      Physical Exam Vitals reviewed.  Constitutional:      General: She is not in acute distress.    Appearance: Normal appearance. She is not ill-appearing, toxic-appearing or diaphoretic.  HENT:     Head: Normocephalic.   Eyes:     General:        Right eye: No discharge.        Left eye: No discharge.     Conjunctiva/sclera: Conjunctivae normal.    Cardiovascular:     Rate and Rhythm: Normal rate.     Pulses: Normal pulses.     Heart sounds: Normal heart sounds.  Pulmonary:     Effort: Pulmonary effort is normal. No respiratory distress.     Breath sounds: Normal breath sounds.  Abdominal:     General: Bowel sounds are normal.     Palpations: Abdomen is soft.     Tenderness: There is abdominal tenderness. There is no right CVA tenderness, left CVA tenderness or guarding.   Skin:    General: Skin is warm and dry.     Capillary Refill: Capillary refill takes less than 2 seconds.   Neurological:     Mental Status: She is alert.   Psychiatric:  Mood and Affect: Mood normal.        Behavior: Behavior normal.      No results found for any visits on 12/14/23.  The ASCVD Risk score (Arnett DK, et al., 2019) failed to calculate for the following reasons:   Risk score cannot be calculated because patient has a medical history suggesting prior/existing ASCVD    Assessment & Plan:  Hematuria, unspecified type Assessment & Plan: During today's visit, patient's urine was noted to be amber in color, previously described as tea-colored. No visible clots were  observed.   Urinalysis, urine culture, and CBC have been ordered to further evaluate the cause of hematuria.  Patient is advised to increase daily water  intake to 2-3 liters and to limit consumption of caffeine and sugary beverages.  A referral to urology has been placed for further evaluation and management. Noted use of Eliquis  (apixaban ) as a significant risk factor for bleeding. Will continue unless otherwise directed by the managing provider. Coordination with prescribing provider and/or cardiology may be considered if hematuria worsens.  Orders: -     Ambulatory referral to Urology -     CBC with Differential/Platelet -     UA/M w/rflx Culture, Routine -     BMP8+eGFR  Thrush, oral -     Nystatin ; Take 5 mLs (500,000 Units total) by mouth 4 (four) times daily.  Dispense: 60 mL; Refill: 1  DDD (degenerative disc disease), cervical -     Ambulatory referral to Pain Clinic    Return if symptoms worsen or fail to improve.   Avelino Lek Amber Bail, FNP

## 2023-12-15 ENCOUNTER — Ambulatory Visit: Admitting: Family Medicine

## 2023-12-15 ENCOUNTER — Encounter: Payer: Self-pay | Admitting: Family Medicine

## 2023-12-15 LAB — CBC WITH DIFFERENTIAL/PLATELET

## 2023-12-17 LAB — UA/M W/RFLX CULTURE, ROUTINE
Bilirubin, UA: NEGATIVE
Ketones, UA: NEGATIVE
Nitrite, UA: NEGATIVE
Specific Gravity, UA: >=1.03 — AB (ref 1.005–1.030)
Urobilinogen, Ur: 0.2 mg/dL (ref 0.2–1.0)
pH, UA: 6 (ref 5.0–7.5)

## 2023-12-17 LAB — CBC WITH DIFFERENTIAL/PLATELET
Basos: 1 %
EOS (ABSOLUTE): 0.1 10*3/uL (ref 0.0–0.2)
Eos: 0 %
Hematocrit: 35.3 % (ref 34.0–46.6)
Hemoglobin: 11 g/dL — ABNORMAL LOW (ref 11.1–15.9)
Immature Granulocytes: 0.1 10*3/uL (ref 0.0–0.1)
Immature Granulocytes: 1 %
Lymphs: 20 %
MCH: 31.1 pg (ref 26.6–33.0)
MCHC: 31.2 g/dL — ABNORMAL LOW (ref 31.5–35.7)
MCV: 100 fL — ABNORMAL HIGH (ref 79–97)
Monocytes Absolute: 0 10*3/uL (ref 0.0–0.4)
Monocytes Absolute: 0.8 10*3/uL (ref 0.1–0.9)
Monocytes: 8 %
Neutrophils Absolute: 2 10*3/uL (ref 0.7–3.1)
Neutrophils Absolute: 7 10*3/uL (ref 1.4–7.0)
Neutrophils: 70 %
Platelets: 151 10*3/uL (ref 150–450)
RBC: 3.54 x10E6/uL — ABNORMAL LOW (ref 3.77–5.28)
RDW: 14 % (ref 11.7–15.4)
WBC: 9.9 10*3/uL (ref 3.4–10.8)

## 2023-12-17 LAB — BMP8+EGFR
BUN/Creatinine Ratio: 19 (ref 12–28)
BUN: 21 mg/dL (ref 8–27)
CO2: 17 mmol/L — ABNORMAL LOW (ref 20–29)
Calcium: 9.1 mg/dL (ref 8.7–10.3)
Chloride: 107 mmol/L — ABNORMAL HIGH (ref 96–106)
Creatinine, Ser: 1.11 mg/dL — ABNORMAL HIGH (ref 0.57–1.00)
Glucose: 194 mg/dL — ABNORMAL HIGH (ref 70–99)
Potassium: 4.3 mmol/L (ref 3.5–5.2)
Sodium: 138 mmol/L (ref 134–144)
eGFR: 52 mL/min/{1.73_m2} — ABNORMAL LOW (ref >59)

## 2023-12-17 LAB — MICROSCOPIC EXAMINATION
Bacteria, UA: NONE SEEN
Casts: NONE SEEN /LPF
RBC, Urine: >30 /HPF — AB (ref 0–2)
WBC, UA: >30 /HPF — AB (ref 0–5)

## 2023-12-17 LAB — URINE CULTURE, REFLEX: Organism ID, Bacteria: NO GROWTH

## 2023-12-18 ENCOUNTER — Telehealth: Payer: Self-pay | Admitting: Family Medicine

## 2023-12-18 ENCOUNTER — Encounter: Payer: Self-pay | Admitting: Family Medicine

## 2023-12-18 ENCOUNTER — Telehealth: Payer: Self-pay

## 2023-12-18 NOTE — Telephone Encounter (Signed)
 Sent medical request for records

## 2023-12-18 NOTE — Telephone Encounter (Signed)
 Patient daughter called [facility]- referral was not entered correctly so will need to be entered for [facility]. Thanks

## 2023-12-18 NOTE — Telephone Encounter (Signed)
 Patient had DRS done in January with [facility] in [place].

## 2023-12-18 NOTE — Telephone Encounter (Signed)
 Pt daughter called to get pt scheduled due to blood in urine possibly passing kidney stones and pain referral is in however pt daughter made aware not sure if all the information has been sent

## 2023-12-19 ENCOUNTER — Telehealth: Payer: Self-pay

## 2023-12-19 NOTE — Telephone Encounter (Signed)
 Copied from CRM [id number]. Topic: General - Other >> Dec 18, 2023 12:20 PM Bearl Botts E wrote: Reason for CRM: Kaitlyn from [facility] regarding a plan of care order for home health certification.   Order number: [id number]  Faxed over 12/13/2023.   Please advise if received.   Best contact: [phone]

## 2023-12-19 NOTE — Progress Notes (Signed)
 Name: [name] DOB: [birth year] MRN: [id number]  History of Present Illness: Ms. Cuthrell is a 73 y.o. female who presents today as a new patient at [facility]. She is accompanied by her daughter Tyra Galley, who assists with providing history due to patient's dementia.  She reports chief complaint of gross hematuria. > 04/29/2023: CT abdomen/pelvis w/ contrast showed no GU stones, masses, or hydronephrosis; bladder unremarkable.  > 12/14/2023: Seen by PCP.  - Per note: The patient initially noted the onset of gross hematuria on Sunday night, which has been intermittent since then. The urine is now described as amber in color, previously appearing tea-colored, with no visible clots and no associated pain (pain rated 0/10). She reports urinary frequency as an irritative symptom. There are no obstructive symptoms such as dribbling. Mild abdominal and flank discomfort are present. She denies bladder pain, dysuria, or urinary retention. The patient is not sexually active and has no history of kidney stones. A notable risk factor is her current use of Eliquis  (apixaban ). Of note, she was taking Augmentin  at that time for purulence around her tracheostomy.  - Urine microscopy: >30 WBC/hpf, >30 RBC/hpf, 0 bacteria - Urine culture: Negative - Renal function stable (creatinine 1.11, GFR 52). - No leukocytosis (WBC 9.9).  Today: She reports urinary urgency, frequency, occasional hesitancy, intermittent stream, and sensations of incomplete emptying. Denies nocturia, dysuria, gross hematuria, or straining to void.   She reports intermittent bilateral lower abdominal pain. She denies fevers, nausea, or vomiting.  She denies history of GU malignancy or pelvic radiation.  She denies history of autoimmune disease. She reports history of smoking.   Medications: Current Outpatient Medications  Medication Sig Dispense Refill   acetaminophen  (TYLENOL ) 650 MG CR tablet Take 1,300 mg by  mouth 2 (two) times daily.     atorvastatin  (LIPITOR ) 40 MG tablet Take 1 tablet (40 mg total) by mouth daily. 90 tablet 3   busPIRone  (BUSPAR ) 30 MG tablet Take 1 tablet (30 mg total) by mouth in the morning and at bedtime. 60 tablet 3   Cholecalciferol  (VITAMIN D -3 PO) Take 1 tablet by mouth daily in the afternoon.     Continuous Glucose Sensor (FREESTYLE LIBRE 2 SENSOR) MISC FOR CONTINUOUS GLUCOSE MONITORING. 2 each 11   Continuous Glucose Sensor (FREESTYLE LIBRE 2 SENSOR) MISC Use daily 1 each 3   Continuous Glucose Sensor (FREESTYLE LIBRE 2 SENSOR) MISC APPLY ONE SENSOR TO THE SKIN EVERY 14 DAYS AS DIRECTED TO CONTINUOUSLY MONITOR GLUCOSE LEVELS 3 each 2   dicyclomine  (BENTYL ) 10 MG capsule TAKE 1 CAPSULE BY MOUTH UP TO 4 TIMES DAILY BEFORE MEALS AND AT BEDTIME (STOP IF CONSTIPATION). 120 capsule 3   donepezil  (ARICEPT ) 5 MG tablet TAKE 1 TABLET BY MOUTH AT BEDTIME FOR dementia 90 tablet 3   ELIQUIS  5 MG TABS tablet TAKE 1 TABLET BY MOUTH TWICE DAILY AS A BLOOD THINNER 180 tablet 3   furosemide  (LASIX ) 20 MG tablet Take 1 tablet (20 mg total) by mouth daily. 100 tablet 3   gabapentin  (NEURONTIN ) 100 MG capsule TAKE ONE CAPSULE BY MOUTH THREE TIMES DAILY FOR NEUROPATHIC PAIN 270 capsule 3   insulin  aspart (NOVOLOG  FLEXPEN) 100 UNIT/ML FlexPen Inject 0-8 Units into the skin 3 (three) times daily with meals. 15 mL 4   insulin  glargine (LANTUS  SOLOSTAR) 100 UNIT/ML Solostar Pen Inject 20 Units into the skin at bedtime. 15 mL 4   ipratropium (ATROVENT ) 0.03 % nasal spray Place 2 sprays into both nostrils  3 (three) times daily as needed for rhinitis. 30 mL 12   ipratropium-albuterol  (DUONEB) 0.5-2.5 (3) MG/3ML SOLN Take 3 mLs by nebulization every 4 (four) hours as needed (Asthma).     JARDIANCE  10 MG TABS tablet TAKE ONE TABLET BY MOUTH EVERY DAY. 30 tablet 2   levETIRAcetam  (KEPPRA ) 500 MG tablet TAKE 1 TABLET BY MOUTH TWICE DAILY FOR SEIZURE PREVENTION 180 tablet 3   levothyroxine  (SYNTHROID ) 150  MCG tablet Take 150 mcg by mouth daily before breakfast.     levothyroxine  (SYNTHROID ) 175 MCG tablet Take 175 mcg by mouth daily before breakfast.     loperamide  (IMODIUM ) 2 MG capsule Take 2 mg by mouth every 8 (eight) hours as needed for diarrhea or loose stools.     LORazepam  (ATIVAN ) 1 MG tablet Take 1 to 1 1/2 tablets once daily as needed for anxiety or to aid with sleep 30 tablet 0   metoprolol  tartrate (LOPRESSOR ) 25 MG tablet TAKE 1 TABLET BY MOUTH TWICE DAILY FOR HIGH BLOOD PRESSURE OR HEART RATE CONTROL 180 tablet 3   nitroGLYCERIN  (NITROSTAT ) 0.4 MG SL tablet Place 0.4 mg under the tongue every 5 (five) minutes as needed for chest pain.     nystatin  (MYCOSTATIN ) 100000 UNIT/ML suspension Take 5 mLs (500,000 Units total) by mouth 4 (four) times daily. 60 mL 1   pantoprazole  (PROTONIX ) 40 MG tablet TAKE 1 TABLET BY MOUTH TWICE DAILY FOR acid reflux 180 tablet 3   potassium chloride  (KLOR-CON  M) 10 MEQ tablet TAKE 2 TABLETS BY MOUTH DAILY TO REPLACE POTASSIUM. 60 tablet 2   sertraline  (ZOLOFT ) 50 MG tablet Take 1 tablet (50 mg total) by mouth daily. 30 tablet 3   traMADol  (ULTRAM ) 50 MG tablet Take 1 tablet (50 mg total) by mouth 3 (three) times daily as needed. 90 tablet 0   trolamine salicylate (ASPERCREME) 10 % cream Apply topically.     No current facility-administered medications for this visit.    Allergies: Allergies  Allergen Reactions   Codeine Nausea And Vomiting and Other (See Comments)    Hallucinations   Dilaudid  [Hydromorphone  Hcl] Other (See Comments)    Syncope    Reglan  Frost.Gainer ] Other (See Comments)    Dyskinesia    Fentanyl      Other Reaction(s): Not available   Levsin  [Hyoscyamine ] Diarrhea    Caused worse diarrhea    Past Medical History:  Diagnosis Date   Abnormal pulmonary function test    Anemia    H/H of 10/30 with a normal MCV in 12/09   Anxiety    Arthritis    Atrial fibrillation (HCC)    Barrett's esophagus    Diagnosed 1995. Last  EGD 2016-NO BARRETT'S.    Chest pain    a. 2022 Cath: nl cors; b. 2008 Neg myoview; c. 01/2014 Cath: Nl cors. EF 40%; c. 08/2020 NSTEMI/Cath [facility]): nl cors.   Chronic anticoagulation    Chronic combined systolic and diastolic CHF (congestive heart failure) (HCC)    a. 10/2014 Echo: EF40%; b. 01/2015 Echo: EF 55-60%; c. 11/2020 Echo: EF 55-60%, mod LVH, sev BAE, mild MR, mod TR. PASP ; d. 01/2021 Echo: EF 45-50%, glob HK, mod conc LVH, mod reduced RV fxn, RVSP 60.70mmHg, Mod TR, mild to mod MR, sev dil LA.   Chronic LBP    Surgical intervention in 1996   Diabetes mellitus, type 2 (HCC)    Insulin  therapy; exacerbated by prednisone    Gastroparesis    99% retention 05/2008 on GES  GERD (gastroesophageal reflux disease)    Heart attack (HCC) 08/13/2020   Hiatal hernia    Hyperlipidemia    Hypertension    Hypothyroid    IBS (irritable bowel syndrome)    Obesity    OSA on CPAP    had CPAP and cannot tolerate.   Paroxysmal atrial fibrillation (HCC)    a.CHA2DS2VASc = 6-->eliquis . Also on Amio.   Pulmonary hypertension (HCC) 01/2015   a. 01/2015 RHC: Predominantly pulmonary venous hypertension but may be component of PAH (PA mean 52, PCWP 31); b. 01/2021 Echo: RVSP 60.3mmHg w/ mod reduced RV fxn.   Seizures (HCC)    last seizure was 2 years ago; on keppra  for this; unknown etiology   Syncope    a. Admitted 05/2009; magnetic resonance imagin/ MRA - negative; etiology thought to be orthostasis secondary to drugs and dehydration. b. Syncope 02/2015 also felt 2/2 dehydration.   Past Surgical History:  Procedure Laterality Date   BACK SURGERY  1996   BIOPSY N/A 11/08/2013   Procedure: BIOPSY  / Tissue sampling / ulcers present in small intestine;  Surgeon: Alyce Jubilee, MD;  [facility];  Service: Endoscopy;  Laterality: N/A;   CARDIAC CATHETERIZATION  2002   CARDIAC CATHETERIZATION N/A 01/26/2015   Procedure: Right Heart Cath;  Surgeon: Darlis Eisenmenger, MD;  Location: [facility] INVASIVE  CV LAB;  Service: Cardiovascular;  Laterality: N/A;   CARDIOVASCULAR STRESS TEST  2008   Stress nuclear study   CARDIOVERSION N/A 03/06/2015   Procedure: CARDIOVERSION;  Surgeon: Darlis Eisenmenger, MD;  Location: [facility] ENDOSCOPY;  Service: Cardiovascular;  Laterality: N/A;   CARPAL TUNNEL RELEASE  1994   CATARACT EXTRACTION W/PHACO Right 01/10/2022   Procedure: CATARACT EXTRACTION PHACO AND INTRAOCULAR LENS PLACEMENT (IOC);  Surgeon: Tarri Farm, MD;  Location: [facility] ORS;  Service: Ophthalmology;  Laterality: Right;  CDE: 21.51   CATARACT EXTRACTION W/PHACO Left 01/24/2022   Procedure: CATARACT EXTRACTION PHACO AND INTRAOCULAR LENS PLACEMENT (IOC);  Surgeon: Tarri Farm, MD;  Location: [facility] ORS;  Service: Ophthalmology;  Laterality: Left;  CDE: 17.13   COLONOSCOPY  11/2011   Dr. Nolene Baumgarten: Internal hemorrhoids, mild diverticulosis. Random colon biopsies negative.   COLONOSCOPY N/A 11/08/2013   SLF: Normal mucosa in the terminal ileum/The colon IS redundant/  Moderate diverticulosis throughout the entire colon. ileum bx benign. colon bx benign   CRYOTHERAPY  01/23/2021   Procedure: CRYOTHERAPY;  Surgeon: Josiah Nigh, MD;  Location: [facility] ENDOSCOPY;  Service: Pulmonary;;   CRYOTHERAPY  01/24/2021   Procedure: Consuela Denier;  Surgeon: Josiah Nigh, MD;  Location: [facility] ENDOSCOPY;  Service: Pulmonary;;   CRYOTHERAPY  01/26/2021   Procedure: Consuela Denier;  Surgeon: Marine Sia, MD;  Location: [facility] ENDOSCOPY;  Service: Cardiopulmonary;;   ESOPHAGOGASTRODUODENOSCOPY  2008   Barrett's without dysplasia. esphagus dilated. antral erosions, h.pylori serologies negative.   ESOPHAGOGASTRODUODENOSCOPY  11/2011   Dr. Nolene Baumgarten: Barrett's esophagus, mild gastritis, diverticulum in the second portion of the duodenum repeat EGD 3 years. Small bowel biopsies negative. Gastric biopsy show reactive gastropathy but no H. pylori. Esophageal biopsies consistent with GERD. Next EGD 11/2014   ESOPHAGOGASTRODUODENOSCOPY N/A  11/21/2014   NWG:NFAO non-erosive gastritis/irregular z-line   ESOPHAGOGASTRODUODENOSCOPY  03/2022   FOREIGN BODY REMOVAL  01/23/2021   Procedure: FOREIGN BODY REMOVAL;  Surgeon: Josiah Nigh, MD;  Location: [facility] ENDOSCOPY;  Service: Pulmonary;;   FOREIGN BODY REMOVAL  01/24/2021   Procedure: FOREIGN BODY REMOVAL;  Surgeon: Josiah Nigh, MD;  Location: [facility] ENDOSCOPY;  Service: Pulmonary;;   GIVENS CAPSULE STUDY  12/07/2011   Proximal small bowel, rare AVM. Distal small bowel, multiple ulcers noted   GIVENS CAPSULE STUDY N/A 09/27/2013   Distal small bowel ulcers extending to TI.   GIVENS CAPSULE STUDY N/A 10/10/2013   Procedure: GIVENS CAPSULE STUDY;  Surgeon: Alyce Jubilee, MD;  [facility];  Service: Endoscopy;  Laterality: N/A;  7:30   HEMOSTASIS CONTROL  01/24/2021   Procedure: HEMOSTASIS CONTROL;  Surgeon: Josiah Nigh, MD;  Location: [facility] ENDOSCOPY;  Service: Pulmonary;;   HEMOSTASIS CONTROL  01/26/2021   Procedure: HEMOSTASIS CONTROL;  Surgeon: Marine Sia, MD;  Location: [facility] ENDOSCOPY;  Service: Cardiopulmonary;;   IR GASTROSTOMY TUBE MOD SED  01/11/2021   IR GASTROSTOMY TUBE REMOVAL  04/13/2021   IR KYPHO THORACIC WITH BONE BIOPSY  02/09/2018   KNEE ARTHROSCOPY WITH MEDIAL MENISECTOMY Right 06/09/2016   Procedure: KNEE ARTHROSCOPY WITH MEDIAL MENISECTOMY;  Surgeon: Darrin Emerald, MD;  Location: [facility];  Service: Orthopedics;  Laterality: Right;  medial and lateral menisectomy   LAMINECTOMY  1995   L4-L5   LAPAROSCOPIC CHOLECYSTECTOMY  1990s   LEFT HEART CATHETERIZATION WITH CORONARY ANGIOGRAM  01/10/2014   Procedure: LEFT HEART CATHETERIZATION WITH CORONARY ANGIOGRAM;  Surgeon: Mickiel Albany, MD;  Location: [facility] CATH LAB;  Service: Cardiovascular;;   MICROLARYNGOSCOPY WITH LASER AND BALLOON DILATION N/A 11/17/2021   Procedure: MICROLARYNGOSCOPY WITH CO2 LASER, SUBGLOTTIC LARYNGEAL MASS BIOSPY;  Surgeon: Janita Mellow, MD;  Location: [facility] OR;  Service:  ENT;  Laterality: N/A;   RIGHT HEART CATHETERIZATION N/A 01/10/2014   Procedure: RIGHT HEART CATH;  Surgeon: Mickiel Albany, MD;  Location: [facility] CATH LAB;  Service: Cardiovascular;  Laterality: N/A;   TOTAL ABDOMINAL HYSTERECTOMY  1999   TRACHEOSTOMY REVISION N/A 07/18/2021   Procedure: TRACHEOSTOMY REVISION;  Surgeon: Vernadine Golas, MD;  Location: [facility] OR;  Service: ENT;  Laterality: N/A;   TRACHEOSTOMY TUBE PLACEMENT N/A 09/01/2017   Procedure: TRACHEOSTOMY;  Surgeon: Prescott Brodie, MD;  Location: [facility] OR;  Service: ENT;  Laterality: N/A;   VIDEO BRONCHOSCOPY Bilateral 01/23/2021   Procedure: VIDEO BRONCHOSCOPY WITHOUT FLUORO;  Surgeon: Josiah Nigh, MD;  Location: [facility] ENDOSCOPY;  Service: Pulmonary;  Laterality: Bilateral;   VIDEO BRONCHOSCOPY Bilateral 01/24/2021   Procedure: VIDEO BRONCHOSCOPY WITHOUT FLUORO;  Surgeon: Josiah Nigh, MD;  Location: [facility] ENDOSCOPY;  Service: Pulmonary;  Laterality: Bilateral;   VIDEO BRONCHOSCOPY N/A 01/26/2021   Procedure: VIDEO BRONCHOSCOPY WITHOUT FLUORO;  Surgeon: Marine Sia, MD;  Location: [facility] ENDOSCOPY;  Service: Cardiopulmonary;  Laterality: N/A;   Family History  Problem Relation Age of Onset   Hypertension Mother    Alzheimer's disease Mother    Stroke Mother    Heart attack Mother    Breast cancer Sister    Hypertension Other    Heart disease Neg Hx    Colon cancer Neg Hx    Liver disease Neg Hx    Social History   Socioeconomic History   Marital status: Married    Spouse name: Not on file   Number of children: Not on file   Years of education: Not on file   Highest education level: Not on file  Occupational History   Occupation: Passenger transport manager at [employer]    Employer: [employer]    Comment: disabled    Employer: RETIRED  Tobacco Use   Smoking status: Former    Current packs/day: 0.00    Average packs/day: 0.3  packs/day for 27.3 years (6.8 ttl pk-yrs)    Types: Cigarettes    Start date: 02/26/1966    Quit date: 06/30/1993     Years since quitting: 30.4   Smokeless tobacco: Never   Tobacco comments:    quit in 1984  Vaping Use   Vaping status: Never Used  Substance and Sexual Activity   Alcohol use: Not Currently    Comment: Occasional alcohol 30 years ago (09/02/21)   Drug use: No   Sexual activity: Yes    Birth control/protection: None, Surgical  Other Topics Concern   Not on file  Social History Narrative   Sedentary   4 children, blended family   Social Drivers of Corporate investment banker Strain: Not on file  Food Insecurity: No Food Insecurity (08/06/2023)   Hunger Vital Sign    Worried About Running Out of Food in the Last Year: Never true    Ran Out of Food in the Last Year: Never true  Transportation Needs: No Transportation Needs (08/06/2023)   PRAPARE - Administrator, Civil Service (Medical): No    Lack of Transportation (Non-Medical): No  Physical Activity: Not on file  Stress: Not on file  Social Connections: Not on file  Intimate Partner Violence: Not At Risk (08/06/2023)   Humiliation, Afraid, Rape, and Kick questionnaire    Fear of Current or Ex-Partner: No    Emotionally Abused: No    Physically Abused: No    Sexually Abused: No    SUBJECTIVE  Review of Systems Constitutional: Patient denies any unintentional weight loss or change in strength lntegumentary: Patient denies any rashes or pruritus Cardiovascular: Patient denies chest pain or syncope Respiratory: Patient denies shortness of breath Gastrointestinal: Patient denies nausea, vomiting, constipation, or diarrhea  Musculoskeletal: Patient denies muscle cramps or weakness Neurologic: Patient denies convulsions or seizures Allergic/Immunologic: Patient denies recent allergic reaction(s) Hematologic/Lymphatic: Patient denies bleeding tendencies Endocrine: Patient denies heat/cold intolerance  GU: As per HPI.  OBJECTIVE Vitals:   12/20/23 1407  BP: 110/64  Pulse: 68   There is no height or weight  on file to calculate BMI.  Physical Examination Constitutional: No obvious distress; patient is non-toxic appearing  Cardiovascular: No visible lower extremity edema.  Respiratory: The patient does not have audible wheezing/stridor; respirations do not appear labored  Gastrointestinal: Abdomen non-distended Musculoskeletal: Normal ROM of UEs  Skin: No obvious rashes/open sores  Neurologic: CN 2-12 grossly intact Psychiatric: Answered questions appropriately with normal affect  Hematologic/Lymphatic/Immunologic: No obvious bruises or sites of spontaneous bleeding  UA: unable to void in office today; she plans to collect urine specimen at home and have home health nurse take cup to lab  PVR: 84 ml  ASSESSMENT Gross hematuria - Plan: BLADDER SCAN AMB NON-IMAGING, Cytology, urine, CT HEMATURIA WORKUP, CANCELED: Urinalysis, Routine w reflex microscopic, CANCELED: Cytology, urine  For management of gross hematuria, we discussed possible etiologies including but not limited to: vigorous exercise, sexual activity, stone, trauma, blood thinner use, urinary tract infection, kidney function, malignancy. We discussed pt's nicotine use as a risk factor for GU cancer and encouraged continued cessation. For further evaluation the following are advised: CT hematuria protocol, voided cytology, and cystoscopy.   We discussed the risk for clot retention and pt was advised to increase fluid intake to thin out clots. Pt was advised to go to the ER if they become unable to urinate due to clot retention, start having symptoms of anemia (which were discussed), or any other  significant concerning acute symptoms.  Patient verbalized understanding and agreement. Patient agreed to follow up afterward to discuss the results and formulate a treatment plan based on the findings. All questions were answered.   PLAN Advised the following: CT ordered. Voided cytology. 2. Return for 1st available cystoscopy with any  urology MD.  Orders Placed This Encounter  Procedures   CT HEMATURIA WORKUP    Standing Status:   Future    Expiration Date:   12/19/2024    Reason for Exam (SYMPTOM  OR DIAGNOSIS REQUIRED):   Gross hematuria    Preferred imaging location?:   [facility]   BLADDER SCAN AMB NON-IMAGING   Total time spent caring for the patient today was over 45 minutes. This includes time spent on the date of the visit reviewing the patient's chart before the visit, time spent during the visit, and time spent after the visit on documentation. Over 50% of that time was spent in face-to-face time with this patient for direct counseling. E&M based on time and complexity of medical decision making.  It has been explained that the patient is to follow regularly with their PCP in addition to all other providers involved in their care and to follow instructions provided by these respective offices. Patient advised to contact urology clinic if any urologic-pertaining questions, concerns, new symptoms or problems arise in the interim period.  There are no Patient Instructions on file for this visit.  Electronically signed by:  Lauretta Ponto, MSN, FNP-C, CUNP 12/20/2023 2:41 PM

## 2023-12-20 ENCOUNTER — Telehealth: Payer: Self-pay

## 2023-12-20 ENCOUNTER — Ambulatory Visit: Admitting: Urology

## 2023-12-20 ENCOUNTER — Encounter: Payer: Self-pay | Admitting: Urology

## 2023-12-20 VITALS — BP 110/64 | HR 68

## 2023-12-20 DIAGNOSIS — R310 Gross hematuria: Secondary | ICD-10-CM | POA: Diagnosis not present

## 2023-12-20 LAB — BLADDER SCAN AMB NON-IMAGING: Scan Result: 84

## 2023-12-20 NOTE — Telephone Encounter (Signed)
-----   Message from Lauretta Ponto sent at 12/19/2023  5:34 PM EDT ----- Patient with dementia who is scheduled for new patient visit on Wednesday 6/18. Please notify SNF / family that patient must be accompanied by POA / legal guardian at check in or appointment will be rescheduled.

## 2023-12-20 NOTE — Telephone Encounter (Signed)
 Family was made aware and voiced understanding.

## 2023-12-26 ENCOUNTER — Ambulatory Visit: Payer: Self-pay | Admitting: Family Medicine

## 2023-12-26 ENCOUNTER — Other Ambulatory Visit: Payer: Self-pay | Admitting: Gastroenterology

## 2023-12-26 ENCOUNTER — Other Ambulatory Visit: Payer: Self-pay | Admitting: Family Medicine

## 2023-12-26 DIAGNOSIS — K580 Irritable bowel syndrome with diarrhea: Secondary | ICD-10-CM

## 2023-12-27 ENCOUNTER — Ambulatory Visit
Admission: RE | Admit: 2023-12-27 | Discharge: 2023-12-27 | Disposition: A | Discharge status: Home / Self Care | Source: Ambulatory Visit | Attending: Urology | Admitting: Urology

## 2023-12-27 DIAGNOSIS — R310 Gross hematuria: Secondary | ICD-10-CM | POA: Diagnosis present

## 2023-12-27 MED ORDER — IOHEXOL 300 MG/ML  SOLN
100.0000 mL | Freq: Once | INTRAMUSCULAR | Status: AC | PRN
Start: 2023-12-27 — End: 2023-12-27
  Administered 2023-12-27: 100 mL via INTRAVENOUS

## 2023-12-27 MED ORDER — SODIUM CHLORIDE 0.9 % IV SOLN: INTRAVENOUS | Status: DC

## 2023-12-28 ENCOUNTER — Ambulatory Visit: Payer: Self-pay | Admitting: Urology

## 2023-12-28 DIAGNOSIS — I5042 Chronic combined systolic (congestive) and diastolic (congestive) heart failure: Secondary | ICD-10-CM | POA: Diagnosis not present

## 2023-12-28 DIAGNOSIS — F02B18 Dementia in other diseases classified elsewhere, moderate, with other behavioral disturbance: Secondary | ICD-10-CM

## 2023-12-28 DIAGNOSIS — E1122 Type 2 diabetes mellitus with diabetic chronic kidney disease: Secondary | ICD-10-CM | POA: Diagnosis not present

## 2023-12-28 DIAGNOSIS — G4733 Obstructive sleep apnea (adult) (pediatric): Secondary | ICD-10-CM

## 2023-12-28 DIAGNOSIS — E039 Hypothyroidism, unspecified: Secondary | ICD-10-CM

## 2023-12-28 DIAGNOSIS — I130 Hypertensive heart and chronic kidney disease with heart failure and stage 1 through stage 4 chronic kidney disease, or unspecified chronic kidney disease: Secondary | ICD-10-CM | POA: Diagnosis not present

## 2023-12-28 DIAGNOSIS — F02B4 Dementia in other diseases classified elsewhere, moderate, with anxiety: Secondary | ICD-10-CM

## 2023-12-28 DIAGNOSIS — J9611 Chronic respiratory failure with hypoxia: Secondary | ICD-10-CM

## 2023-12-28 DIAGNOSIS — Z430 Encounter for attention to tracheostomy: Secondary | ICD-10-CM

## 2023-12-28 DIAGNOSIS — F02B3 Dementia in other diseases classified elsewhere, moderate, with mood disturbance: Secondary | ICD-10-CM

## 2023-12-28 DIAGNOSIS — D631 Anemia in chronic kidney disease: Secondary | ICD-10-CM

## 2023-12-28 DIAGNOSIS — N1831 Chronic kidney disease, stage 3a: Secondary | ICD-10-CM | POA: Diagnosis not present

## 2023-12-28 NOTE — Progress Notes (Signed)
 Please let patient / daughter know that CT showed indeterminate bladder wall thickening; possibly due to inflammation or malignancy. Follow up for cystoscopy as planned for further evaluation. Thanks.

## 2024-01-01 ENCOUNTER — Telehealth: Payer: Self-pay

## 2024-01-01 NOTE — Telephone Encounter (Signed)
 Pts daughter advised with verbal understanding

## 2024-01-01 NOTE — Telephone Encounter (Signed)
 Spoke to [name] , certification was faxed back

## 2024-01-01 NOTE — Telephone Encounter (Signed)
 Copied from CRM [id number]. Topic: Referral - Status >> Jan 01, 2024 10:50 AM Travis F wrote: Reason for CRM: Patient's daughter Karna is calling in to check on the status of a referral for pain management. Karna says she hasn't been contacted by anyone or heard anything further. Please follow up with Karna.

## 2024-01-02 ENCOUNTER — Telehealth: Payer: Self-pay | Admitting: Family Medicine

## 2024-01-02 NOTE — Telephone Encounter (Unsigned)
 Copied from CRM [id number]. Topic: Clinical - Medication Refill >> Jan 02, 2024  3:13 PM Sophia H wrote: Medication: levothyroxine  (SYNTHROID ) 150 MCG tablet   Has the patient contacted their pharmacy? Yes, told waiting on clinic for approval  This is the patient's preferred pharmacy:  [facility] [place], [place] - [id number] [place] [address] Phone: [phone] Fax: [phone]  Is this the correct pharmacy for this prescription? Yes If no, delete pharmacy and type the correct one.   Has the prescription been filled recently? Yes  Is the patient out of the medication? No  Has the patient been seen for an appointment in the last year OR does the patient have an upcoming appointment? Yes  Can we respond through MyChart? Yes  Agent: Please be advised that Rx refills may take up to 3 business days. We ask that you follow-up with your pharmacy.

## 2024-01-03 MED ORDER — LEVOTHYROXINE SODIUM 150 MCG PO TABS: 150.0000 ug | ORAL_TABLET | Freq: Every day | ORAL | 0 refills | Status: DC

## 2024-01-04 ENCOUNTER — Other Ambulatory Visit: Payer: Self-pay | Admitting: Family Medicine

## 2024-01-04 ENCOUNTER — Other Ambulatory Visit: Payer: Self-pay | Admitting: Acute Care

## 2024-01-04 ENCOUNTER — Other Ambulatory Visit: Payer: Self-pay

## 2024-01-04 MED ORDER — AZITHROMYCIN 250 MG PO TABS: ORAL_TABLET | ORAL | 0 refills | Status: AC

## 2024-01-04 MED ORDER — FLUCONAZOLE 150 MG PO TABS: 150.0000 mg | ORAL_TABLET | Freq: Every day | ORAL | 0 refills | Status: DC

## 2024-01-04 NOTE — Telephone Encounter (Signed)
 Copied from CRM [id number]. Topic: Clinical - Medication Refill >> Jan 04, 2024  9:16 AM Sasha H wrote: Medication:  traMADol  (ULTRAM ) 50 MG tablet Has the patient contacted their pharmacy? Yes (Agent: If no, request that the patient contact the pharmacy for the refill. If patient does not wish to contact the pharmacy document the reason why and proceed with request.) (Agent: If yes, when and what did the pharmacy advise?)  This is the patient's preferred pharmacy:  [facility] [place], [place] - [id number] [place] [address] Phone: [phone] Fax: [phone]  Is this the correct pharmacy for this prescription? Yes If no, delete pharmacy and type the correct one.   Has the prescription been filled recently? Yes  Is the patient out of the medication? No  Has the patient been seen for an appointment in the last year OR does the patient have an upcoming appointment? Yes  Can we respond through MyChart? Yes  Agent: Please be advised that Rx refills may take up to 3 business days. We ask that you follow-up with your pharmacy.

## 2024-01-08 ENCOUNTER — Telehealth: Payer: Self-pay

## 2024-01-08 NOTE — Telephone Encounter (Signed)
 Copied from CRM [id number]. Topic: Referral - Status >> Jan 08, 2024 11:18 AM Turkey B wrote: Reason for CRM: pt S daughter called in about status of pain management referral for cervical, request was put in June 12, still says pending. Please cb with further assistance

## 2024-01-09 NOTE — Telephone Encounter (Signed)
 Referral sent to: [facility] [phone]  MyChart Message sent to Patient with Specialty Office contact information.  We are working hard at getting referrals out as quickly as we can :)

## 2024-01-17 ENCOUNTER — Other Ambulatory Visit: Payer: Self-pay | Admitting: Acute Care

## 2024-01-17 ENCOUNTER — Ambulatory Visit (HOSPITAL_COMMUNITY)
Admission: RE | Admit: 2024-01-17 | Discharge: 2024-01-17 | Disposition: A | Discharge status: Home / Self Care | Source: Ambulatory Visit | Attending: Family Medicine | Admitting: Family Medicine

## 2024-01-17 DIAGNOSIS — Z430 Encounter for attention to tracheostomy: Secondary | ICD-10-CM | POA: Insufficient documentation

## 2024-01-17 NOTE — Progress Notes (Signed)
  Home Health nursing order  Please change trach (inner cannula every 4 weeks), first change ~ 01/31/24  [name] Banner ACNP-BC [facility] Pulmonary/Critical Care Pager # [phone] OR # [phone] if no answer

## 2024-01-17 NOTE — Progress Notes (Signed)
 Tracheostomy Procedure Note  [name] [id number] Jun 08, 1951  Pre Procedure Tracheostomy Information  Trach Brand: Shiley Size: 6.0 6CN75 Style: Cuffed Secured by: Velcro   Procedure: Trach Change/ Trach Cleaning and Bronchoscopy Lidocaine  neb tx given prior to trach change   Post Procedure Tracheostomy Information  Trach Brand: Shiley Size: 6.0 6CN75 Style: Cuffed Secured by: Velcro   Post Procedure Evaluation:  ETCO2 positive color change from yellow to purple : Yes.   Vital signs:VSS Patients current condition: stable Complications: No apparent complications Trach site exam: clean, dry Wound care done: Other: Drawtex  trach drain gauze Patient did tolerate procedure well.   Education: New trach wound   Prescription needs: none    Additional needs: 1  box of Drawtex trach drain gauze

## 2024-01-20 ENCOUNTER — Other Ambulatory Visit: Payer: Self-pay | Admitting: Family Medicine

## 2024-01-22 ENCOUNTER — Other Ambulatory Visit: Payer: Self-pay | Admitting: Family Medicine

## 2024-01-29 ENCOUNTER — Telehealth: Payer: Self-pay

## 2024-01-29 ENCOUNTER — Other Ambulatory Visit: Payer: Self-pay

## 2024-01-29 MED ORDER — LEVOTHYROXINE SODIUM 150 MCG PO TABS: 150.0000 ug | ORAL_TABLET | Freq: Every day | ORAL | 0 refills | Status: DC

## 2024-01-29 NOTE — Telephone Encounter (Signed)
 Copied from CRM [id number]. Topic: Clinical - Prescription Issue >> Jan 29, 2024  9:44 AM Donna BRAVO wrote: Reason for CRM: Robin with [facility] [phone] asking for refill on levothyroxine  (SYNTHROID ) 150 MCG tablet this has been discontinued pharmacy fills this with the other  medications in a pill pack and need to know if this will be refilled.  Need this by 01/29/24 latest by 01/30/24  Please call let Grayce know if there is a problem [phone]

## 2024-01-29 NOTE — Telephone Encounter (Signed)
 Refill sent to pharmacy.

## 2024-01-30 ENCOUNTER — Ambulatory Visit: Admitting: Urology

## 2024-01-30 VITALS — BP 127/70 | HR 48

## 2024-01-30 DIAGNOSIS — R310 Gross hematuria: Secondary | ICD-10-CM

## 2024-01-30 DIAGNOSIS — N3001 Acute cystitis with hematuria: Secondary | ICD-10-CM

## 2024-01-30 MED ORDER — CEPHALEXIN 500 MG PO CAPS: 500.0000 mg | ORAL_CAPSULE | Freq: Two times a day (BID) | ORAL | 0 refills | Status: DC

## 2024-01-30 NOTE — Progress Notes (Signed)
 History of Present Illness: [name] is a 73 y.o. year old female here for cystoscopy to complete hematuria evaluation.  Past Medical History:  Diagnosis Date   Abnormal pulmonary function test    Anemia    H/H of 10/30 with a normal MCV in 12/09   Anxiety    Arthritis    Atrial fibrillation (HCC)    Barrett's esophagus    Diagnosed 1995. Last EGD 2016-NO BARRETT'S.    Chest pain    a. 2022 Cath: nl cors; b. 2008 Neg myoview; c. 01/2014 Cath: Nl cors. EF 40%; c. 08/2020 NSTEMI/Cath [facility]): nl cors.   Chronic anticoagulation    Chronic combined systolic and diastolic CHF (congestive heart failure) (HCC)    a. 10/2014 Echo: EF40%; b. 01/2015 Echo: EF 55-60%; c. 11/2020 Echo: EF 55-60%, mod LVH, sev BAE, mild MR, mod TR. PASP ; d. 01/2021 Echo: EF 45-50%, glob HK, mod conc LVH, mod reduced RV fxn, RVSP 60.44mmHg, Mod TR, mild to mod MR, sev dil LA.   Chronic LBP    Surgical intervention in 1996   Diabetes mellitus, type 2 (HCC)    Insulin  therapy; exacerbated by prednisone    Gastroparesis    99% retention 05/2008 on GES   GERD (gastroesophageal reflux disease)    Heart attack (HCC) 08/13/2020   Hiatal hernia    Hyperlipidemia    Hypertension    Hypothyroid    IBS (irritable bowel syndrome)    Obesity    OSA on CPAP    had CPAP and cannot tolerate.   Paroxysmal atrial fibrillation (HCC)    a.CHA2DS2VASc = 6-->eliquis . Also on Amio.   Pulmonary hypertension (HCC) 01/2015   a. 01/2015 RHC: Predominantly pulmonary venous hypertension but may be component of PAH (PA mean 52, PCWP 31); b. 01/2021 Echo: RVSP 60.78mmHg w/ mod reduced RV fxn.   Seizures (HCC)    last seizure was 2 years ago; on keppra  for this; unknown etiology   Syncope    a. Admitted 05/2009; magnetic resonance imagin/ MRA - negative; etiology thought to be orthostasis secondary to drugs and dehydration. b. Syncope 02/2015 also felt 2/2 dehydration.    Past Surgical History:  Procedure Laterality Date   BACK  SURGERY  1996   BIOPSY N/A 11/08/2013   Procedure: BIOPSY  / Tissue sampling / ulcers present in small intestine;  Surgeon: Margo LITTIE Haddock, MD;  [facility];  Service: Endoscopy;  Laterality: N/A;   CARDIAC CATHETERIZATION  2002   CARDIAC CATHETERIZATION N/A 01/26/2015   Procedure: Right Heart Cath;  Surgeon: Ezra GORMAN Shuck, MD;  Location: [facility] INVASIVE CV LAB;  Service: Cardiovascular;  Laterality: N/A;   CARDIOVASCULAR STRESS TEST  2008   Stress nuclear study   CARDIOVERSION N/A 03/06/2015   Procedure: CARDIOVERSION;  Surgeon: Ezra GORMAN Shuck, MD;  Location: [facility] ENDOSCOPY;  Service: Cardiovascular;  Laterality: N/A;   CARPAL TUNNEL RELEASE  1994   CATARACT EXTRACTION W/PHACO Right 01/10/2022   Procedure: CATARACT EXTRACTION PHACO AND INTRAOCULAR LENS PLACEMENT (IOC);  Surgeon: Harrie Agent, MD;  Location: [facility] ORS;  Service: Ophthalmology;  Laterality: Right;  CDE: 21.51   CATARACT EXTRACTION W/PHACO Left 01/24/2022   Procedure: CATARACT EXTRACTION PHACO AND INTRAOCULAR LENS PLACEMENT (IOC);  Surgeon: Harrie Agent, MD;  Location: [facility];  Service: Ophthalmology;  Laterality: Left;  CDE: 17.13   COLONOSCOPY  11/2011   Dr. haddock: Internal hemorrhoids, mild diverticulosis. Random colon biopsies negative.   COLONOSCOPY N/A 11/08/2013   SLF:  Normal mucosa in the terminal ileum/The colon IS redundant/  Moderate diverticulosis throughout the entire colon. ileum bx benign. colon bx benign   CRYOTHERAPY  01/23/2021   Procedure: CRYOTHERAPY;  Surgeon: Claudene Toribio BROCKS, MD;  Location: [facility] ENDOSCOPY;  Service: Pulmonary;;   CRYOTHERAPY  01/24/2021   Procedure: CHANA;  Surgeon: Claudene Toribio BROCKS, MD;  Location: [facility] ENDOSCOPY;  Service: Pulmonary;;   CRYOTHERAPY  01/26/2021   Procedure: CHANA;  Surgeon: Alaine Vicenta NOVAK, MD;  Location: [facility] ENDOSCOPY;  Service: Cardiopulmonary;;   ESOPHAGOGASTRODUODENOSCOPY  2008   Barrett's without dysplasia. esphagus dilated. antral erosions,  h.pylori serologies negative.   ESOPHAGOGASTRODUODENOSCOPY  11/2011   Dr. Harvey: Barrett's esophagus, mild gastritis, diverticulum in the second portion of the duodenum repeat EGD 3 years. Small bowel biopsies negative. Gastric biopsy show reactive gastropathy but no H. pylori. Esophageal biopsies consistent with GERD. Next EGD 11/2014   ESOPHAGOGASTRODUODENOSCOPY N/A 11/21/2014   DOQ:fpoi non-erosive gastritis/irregular z-line   ESOPHAGOGASTRODUODENOSCOPY  03/2022   FOREIGN BODY REMOVAL  01/23/2021   Procedure: FOREIGN BODY REMOVAL;  Surgeon: Claudene Toribio BROCKS, MD;  Location: [facility] ENDOSCOPY;  Service: Pulmonary;;   FOREIGN BODY REMOVAL  01/24/2021   Procedure: FOREIGN BODY REMOVAL;  Surgeon: Claudene Toribio BROCKS, MD;  Location: [facility] ENDOSCOPY;  Service: Pulmonary;;   GIVENS CAPSULE STUDY  12/07/2011   Proximal small bowel, rare AVM. Distal small bowel, multiple ulcers noted   GIVENS CAPSULE STUDY N/A 09/27/2013   Distal small bowel ulcers extending to TI.   GIVENS CAPSULE STUDY N/A 10/10/2013   Procedure: GIVENS CAPSULE STUDY;  Surgeon: Margo LITTIE Harvey, MD;  [facility];  Service: Endoscopy;  Laterality: N/A;  7:30   HEMOSTASIS CONTROL  01/24/2021   Procedure: HEMOSTASIS CONTROL;  Surgeon: Claudene Toribio BROCKS, MD;  Location[facility] ENDOSCOPY;  Service: Pulmonary;;   HEMOSTASIS CONTROL  01/26/2021   Procedure: HEMOSTASIS CONTROL;  Surgeon: Alaine Vicenta NOVAK, MD;  Location: [facility] ENDOSCOPY;  Service: Cardiopulmonary;;   IR GASTROSTOMY TUBE MOD SED  01/11/2021   IR GASTROSTOMY TUBE REMOVAL  04/13/2021   IR KYPHO THORACIC WITH BONE BIOPSY  02/09/2018   KNEE ARTHROSCOPY WITH MEDIAL MENISECTOMY Right 06/09/2016   Procedure: KNEE ARTHROSCOPY WITH MEDIAL MENISECTOMY;  Surgeon: Taft FORBES Minerva, MD;  Location: [facility];  Service: Orthopedics;  Laterality: Right;  medial and lateral menisectomy   LAMINECTOMY  1995   L4-L5   LAPAROSCOPIC CHOLECYSTECTOMY  1990s   LEFT HEART CATHETERIZATION WITH CORONARY  ANGIOGRAM  01/10/2014   Procedure: LEFT HEART CATHETERIZATION WITH CORONARY ANGIOGRAM;  Surgeon: Victory LELON Claudene DOUGLAS, MD;  Location: [facility] CATH LAB;  Service: Cardiovascular;;   MICROLARYNGOSCOPY WITH LASER AND BALLOON DILATION N/A 11/17/2021   Procedure: MICROLARYNGOSCOPY WITH CO2 LASER, SUBGLOTTIC LARYNGEAL MASS BIOSPY;  Surgeon: Jesus Oliphant, MD;  Location: [facility] OR;  Service: ENT;  Laterality: N/A;   RIGHT HEART CATHETERIZATION N/A 01/10/2014   Procedure: RIGHT HEART CATH;  Surgeon: Victory LELON Claudene DOUGLAS, MD;  Location: [facility] CATH LAB;  Service: Cardiovascular;  Laterality: N/A;   TOTAL ABDOMINAL HYSTERECTOMY  1999   TRACHEOSTOMY REVISION N/A 07/18/2021   Procedure: TRACHEOSTOMY REVISION;  Surgeon: Roark Rush, MD;  Location: [facility] OR;  Service: ENT;  Laterality: N/A;   TRACHEOSTOMY TUBE PLACEMENT N/A 09/01/2017   Procedure: TRACHEOSTOMY;  Surgeon: Ethyl Lonni FORBES, MD;  Location: [facility] OR;  Service: ENT;  Laterality: N/A;   VIDEO BRONCHOSCOPY Bilateral 01/23/2021   Procedure: VIDEO BRONCHOSCOPY WITHOUT FLUORO;  Surgeon: Claudene Toribio BROCKS, MD;  Location: [facility] ENDOSCOPY;  Service: Pulmonary;  Laterality: Bilateral;   VIDEO BRONCHOSCOPY Bilateral 01/24/2021   Procedure: VIDEO BRONCHOSCOPY WITHOUT FLUORO;  Surgeon: Claudene Toribio BROCKS, MD;  Location: [facility] ENDOSCOPY;  Service: Pulmonary;  Laterality: Bilateral;   VIDEO BRONCHOSCOPY N/A 01/26/2021   Procedure: VIDEO BRONCHOSCOPY WITHOUT FLUORO;  Surgeon: Alaine Vicenta NOVAK, MD;  Location: [facility] ENDOSCOPY;  Service: Cardiopulmonary;  Laterality: N/A;    Home Medications:  (Not in a hospital admission)   Allergies:  Allergies  Allergen Reactions   Codeine Nausea And Vomiting and Other (See Comments)    Hallucinations   Dilaudid  [Hydromorphone  Hcl] Other (See Comments)    Syncope    Reglan  [Metoclopramide ] Other (See Comments)    Dyskinesia    Fentanyl      Other Reaction(s): Not available   Levsin  [Hyoscyamine ] Diarrhea    Caused worse diarrhea    Family  History  Problem Relation Age of Onset   Hypertension Mother    Alzheimer's disease Mother    Stroke Mother    Heart attack Mother    Breast cancer Sister    Hypertension Other    Heart disease Neg Hx    Colon cancer Neg Hx    Liver disease Neg Hx     Social History:  reports that she quit smoking about 30 years ago. Her smoking use included cigarettes. She started smoking about 57 years ago. She has a 6.8 pack-year smoking history. She has never used smokeless tobacco. She reports that she does not currently use alcohol. She reports that she does not use drugs.  ROS: A complete review of systems was performed.  All systems are negative except for pertinent findings as noted.  Physical Exam:  Vital signs in last 24 hours: @VSRANGES @ General:  Alert and oriented, No acute distress HEENT: Normocephalic, atraumatic Neck: No JVD or lymphadenopathy Cardiovascular: Regular rate  Lungs: Normal inspiratory/expiratory excursion Abdomen: Soft, nontender, nondistended, no abdominal masses Back: No CVA tenderness Extremities: No edema Neurologic: Grossly intact  I have reviewed prior pt notes  I have reviewed notes from referring/previous physicians  I have reviewed urinalysis results  I have independently reviewed prior imaging--hematuria CT images from 6/25 independently reviewed.  Findings: IMPRESSION: 1. Eccentric bladder wall thickening and mucosal hyperenhancement in the posterior right aspect of the bladder, up to 1.2 cm in thickness. This may reflect unusual focal infectious or inflammatory cystitis but is worrisome for sessile malignancy. 2. Prominent bilateral external iliac and pelvic sidewall lymph nodes, nonspecific, possibly reactive although nodal metastatic disease not excluded. 3. No other evidence of lymphadenopathy or metastatic disease in the abdomen or pelvis. 4. No urinary tract calculi or hydronephrosis. 5. Hepatomegaly. 6. Status post cholecystectomy and  hysterectomy.  I have reviewed prior urine culture     Impression/Assessment:  Hematuria with abnormal appearing bladder on CT scan  Cystitis, acute  Plan:  -Cystoscopy was canceled today  - Urine sent for culture, antibiotics sent in  - I will have her come back next available for cystoscopy  Garnette HERO Taniya Dasher 01/30/2024, 2:50 PM  Garnette HERO. Corney Knighton MD

## 2024-01-31 LAB — URINALYSIS, ROUTINE W REFLEX MICROSCOPIC
Bilirubin, UA: NEGATIVE
Ketones, UA: NEGATIVE
Nitrite, UA: POSITIVE — AB
Specific Gravity, UA: 1.02 (ref 1.005–1.030)
Urobilinogen, Ur: 0.2 mg/dL (ref 0.2–1.0)
pH, UA: 6 (ref 5.0–7.5)

## 2024-01-31 LAB — MICROSCOPIC EXAMINATION: WBC, UA: >30 /HPF — ABNORMAL HIGH (ref 0–5)

## 2024-02-01 LAB — URINE CULTURE

## 2024-02-06 ENCOUNTER — Ambulatory Visit: Payer: Self-pay

## 2024-02-08 ENCOUNTER — Other Ambulatory Visit: Payer: Self-pay | Admitting: Family Medicine

## 2024-02-09 IMAGING — DX DG KNEE COMPLETE 4+V*R*
4 series · 4 of 4 positions shown · non-contrast
Comparison: None.

CLINICAL DATA: Lateral right knee pain for 1 year.

EXAM:
RIGHT KNEE - COMPLETE 4+ VIEW

[knee ap]
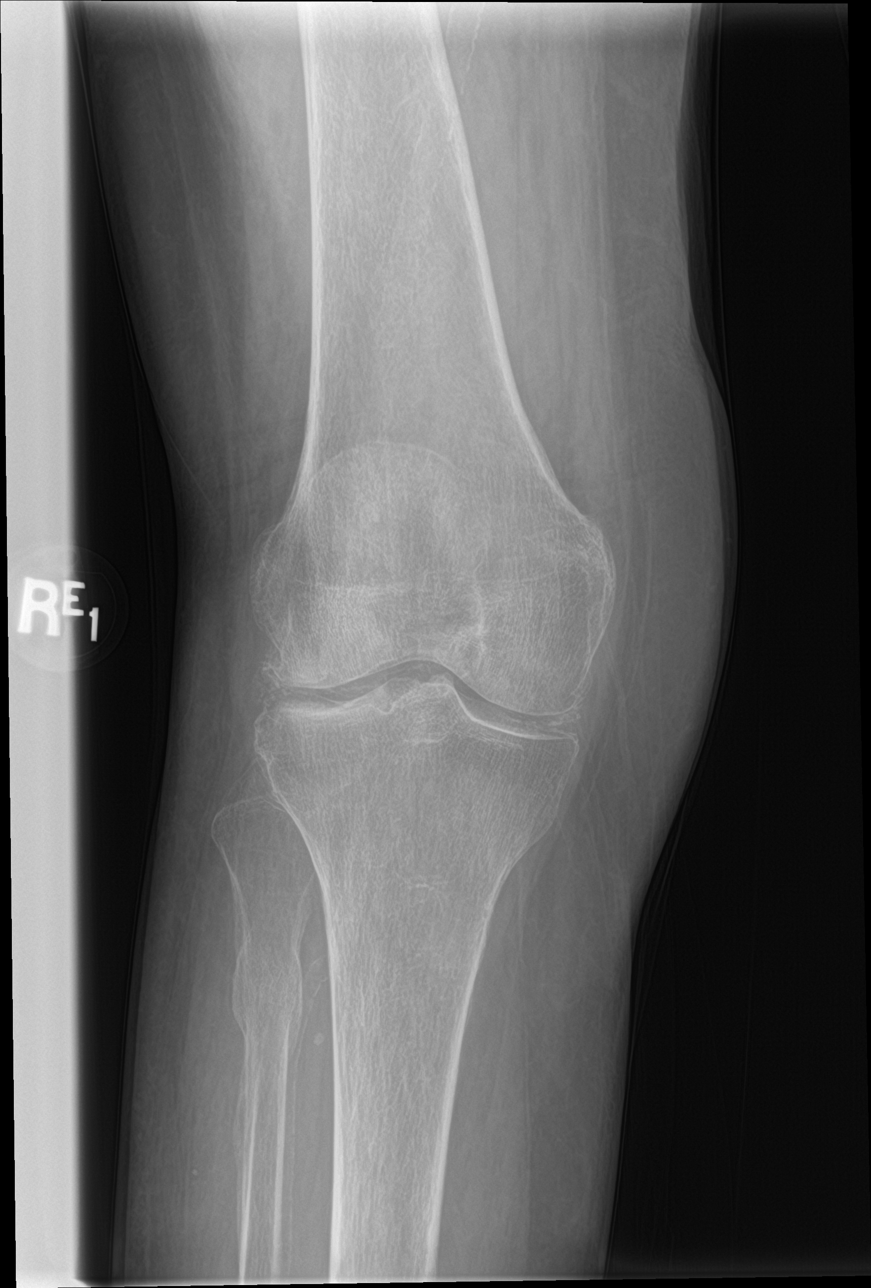

[knee obl (1 of 2)]
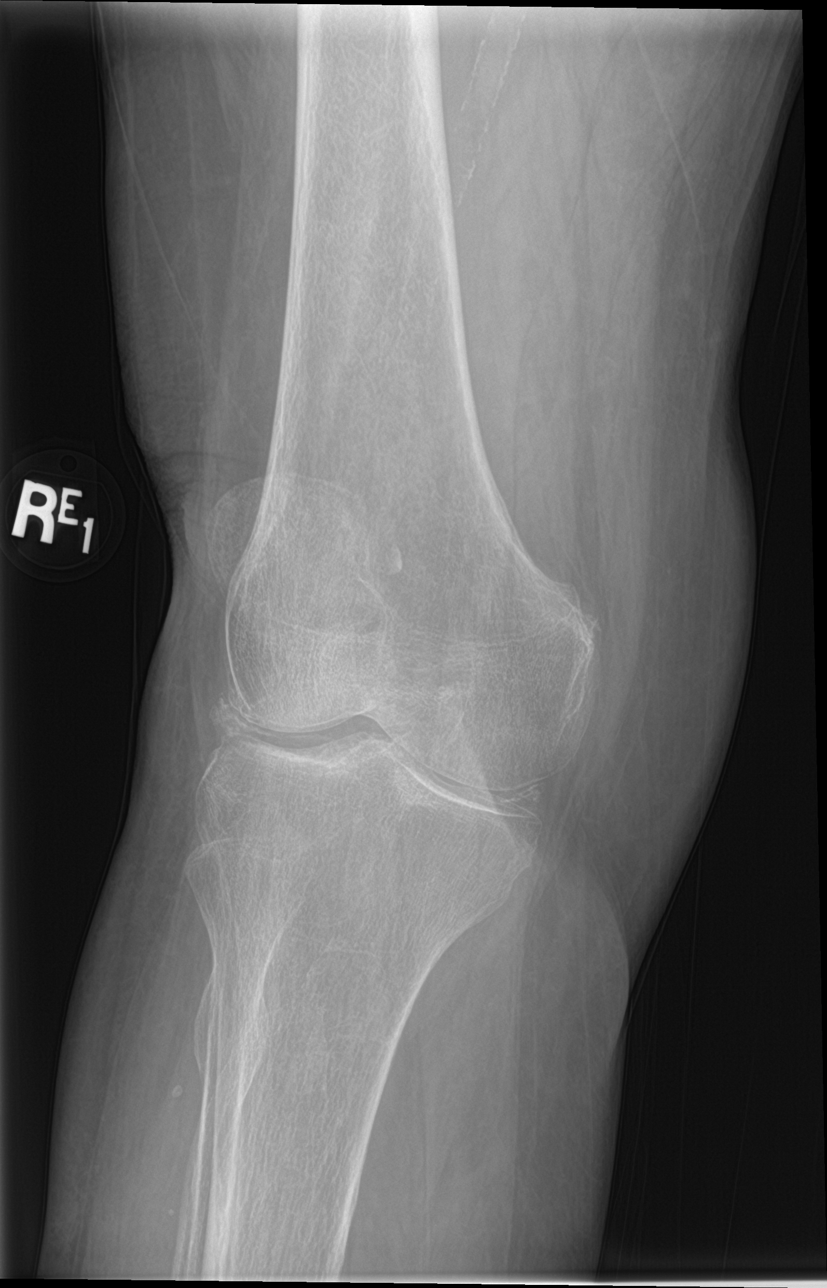

[knee obl (2 of 2)]
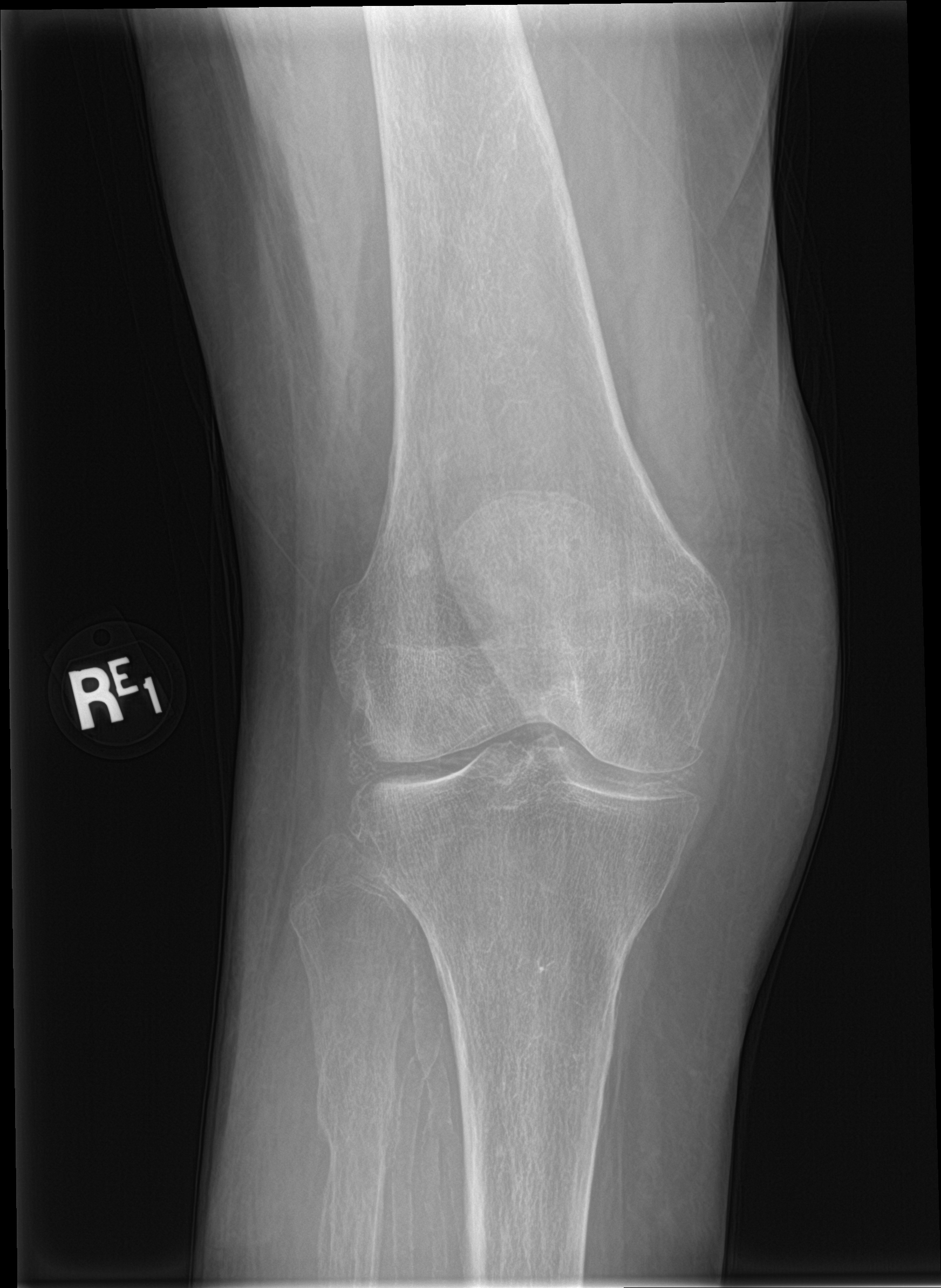

[knee lat]
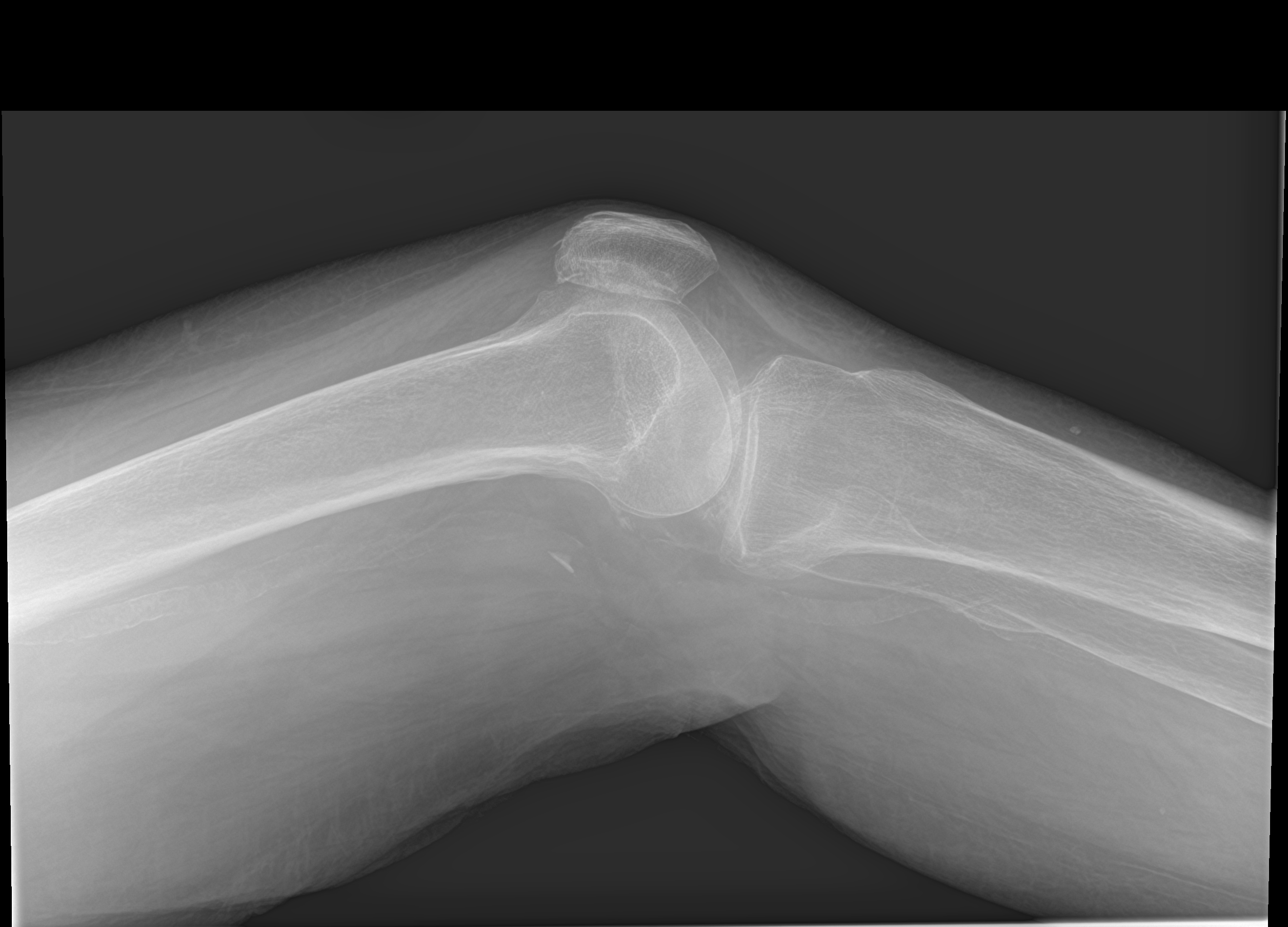

[4 of 4 positions shown; findings below may reference images not displayed]

FINDINGS: There is diffuse decreased bone mineralization. Mild medial and
lateral compartment joint space narrowing. Mild-to-moderate
chondrocalcinosis of the lateral greater than medial compartments.
No joint effusion. Minimal superior patellar degenerative
osteophytosis. a

There is smooth cortical thickening of the proximal left fibular
diaphysis suggesting a subacute to chronic fracture. Vascular
calcifications are noted.
IMPRESSION: Smooth cortical thickening of the proximal fibular diaphysis. This
does not appear to represent an acute fracture but rather suggests a
fracture with at least partial healing. Recommend clinical
correlation with the patient's lateral pain. This may represent a
subacute to chronic fracture.

## 2024-02-09 IMAGING — DX DG CHEST 2V
2 series · 2 of 2 positions shown · non-contrast
Comparison: [id number]

CLINICAL DATA: 70-year-old female with a history of amiodarone

EXAM:
CHEST - 2 VIEW

[chest pa]
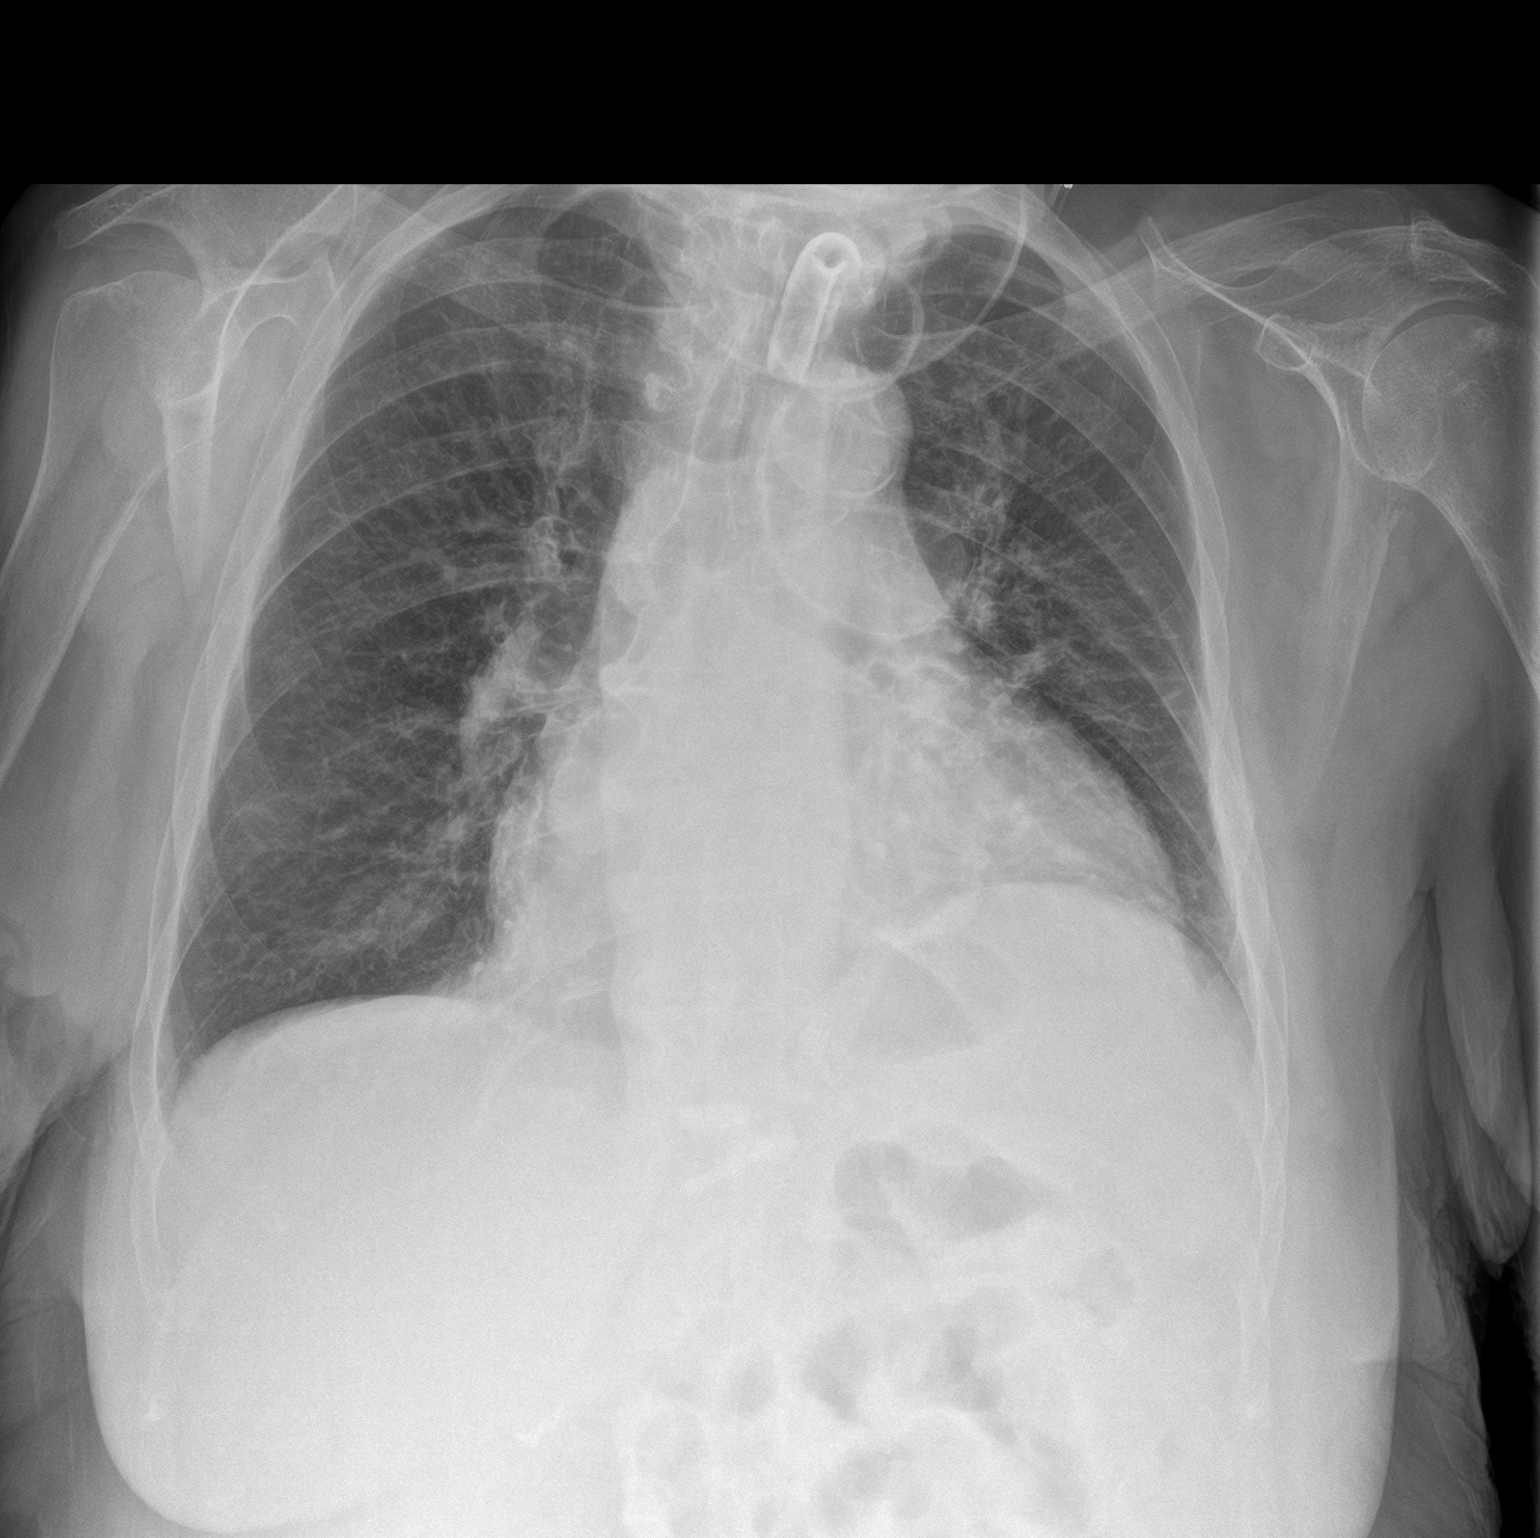

[chest lat]
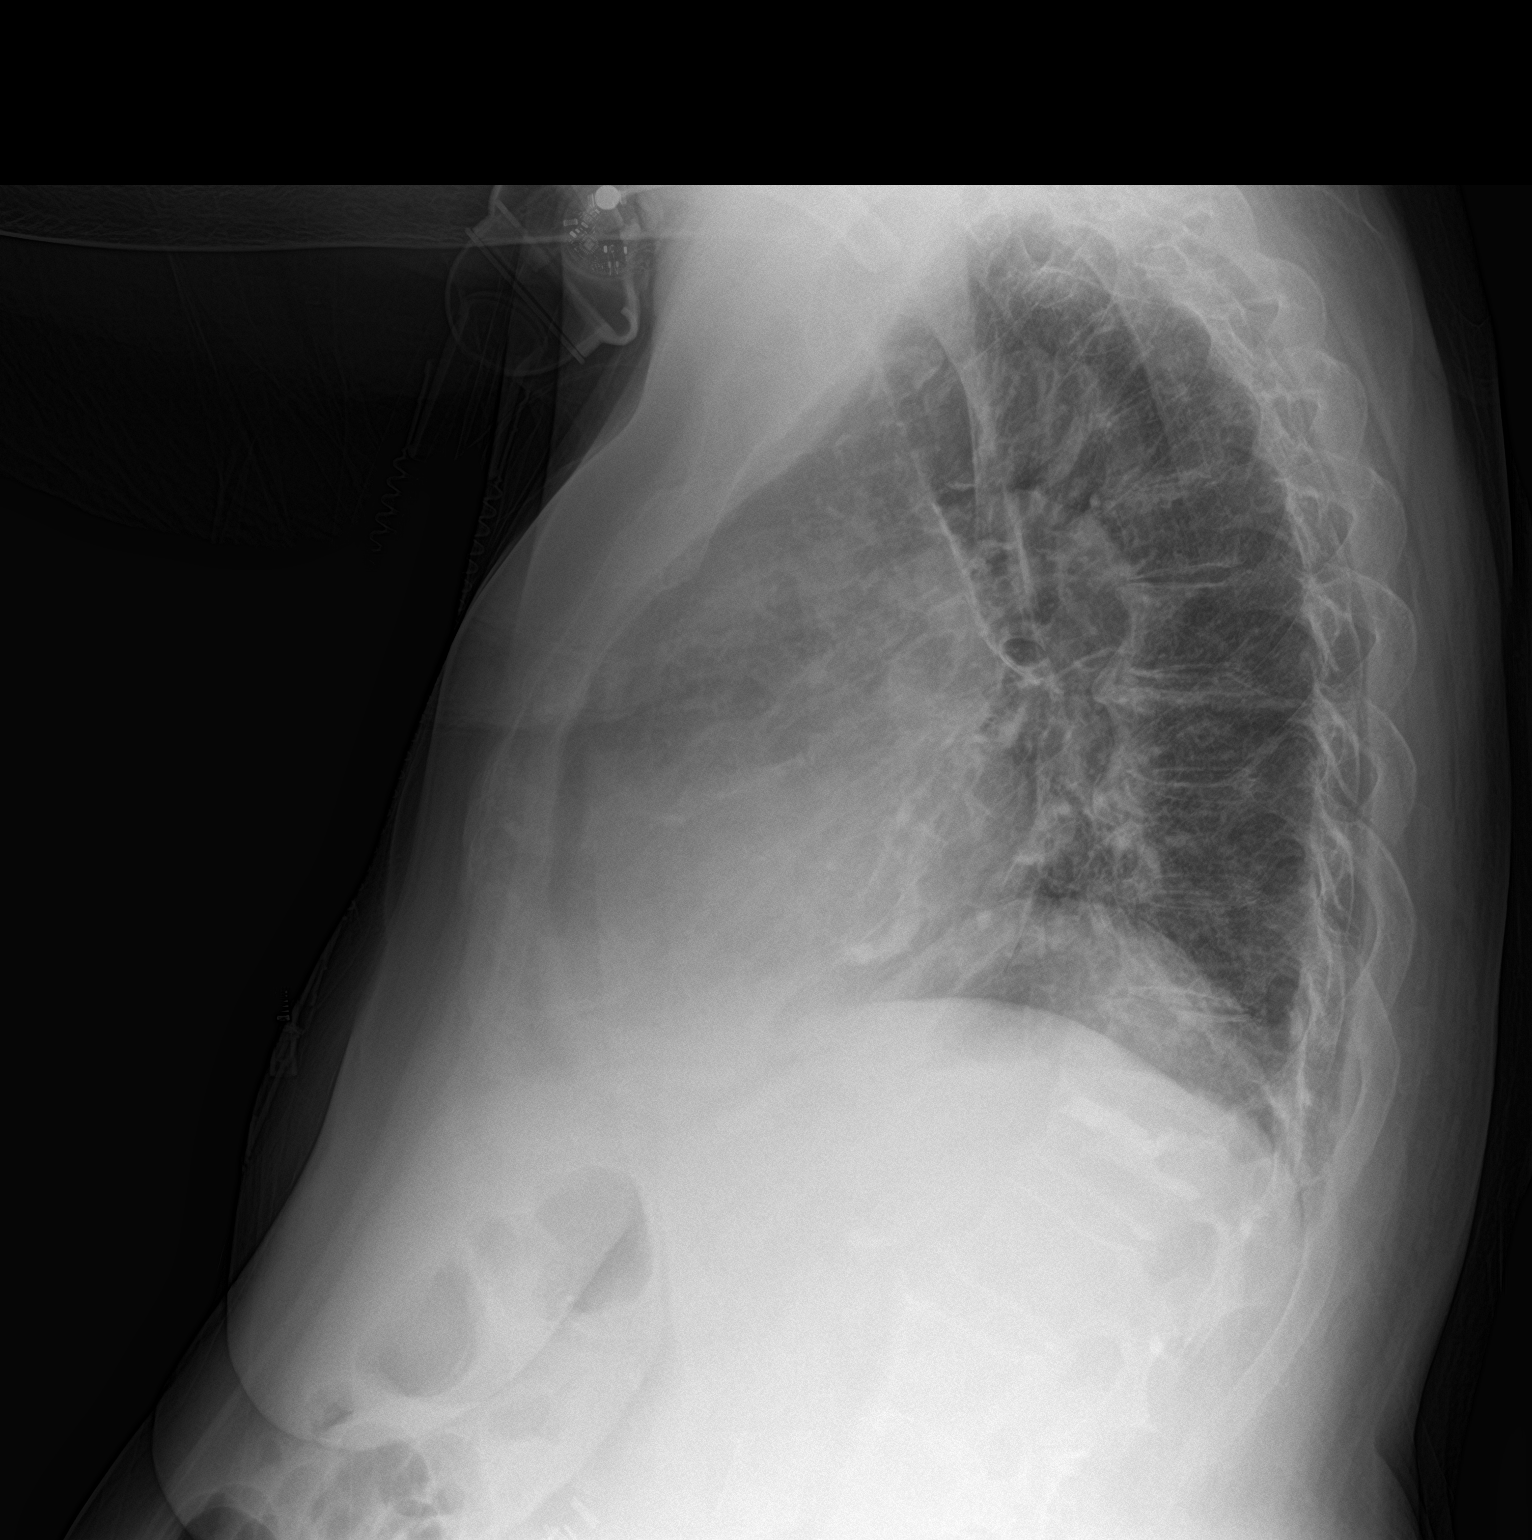

[2 of 2 positions shown; findings below may reference images not displayed]

FINDINGS: Cardiomediastinal silhouette unchanged, with improved clarity of the
heart borders.

Improved aeration compared to the prior plain film with resolution
of multifocal mixed interstitial and airspace disease.

Coarsened interstitial markings of the lungs, similar to the
comparison. No progression reticular pattern of opacity. No
pneumothorax. No pleural effusion.

Tracheostomy unchanged.

Mitral annular calcifications.

Degenerative changes spine. Vertebral augmentation of L1. No acute
displaced fracture
IMPRESSION: Improved aeration of the lungs compared to the prior plain film,
with background of chronic changes and no evidence of acute
cardiopulmonary disease.

Unchanged tracheostomy.

Cardiomegaly and mitral annular calcifications.

## 2024-02-11 ENCOUNTER — Other Ambulatory Visit: Payer: Self-pay | Admitting: Family Medicine

## 2024-02-12 NOTE — Progress Notes (Signed)
 Impression/Assessment:  Hematuria with abnormal appearing bladder on CT scan  Cystitis, acute  Plan:  - I will send urine for culture as well as cytology to [facility]-with reflex to FISH  -It may be worth giving her, eventually, a fairly long-term antibiotic with repeat cystoscopy following that  - We will call with the results of her culture as well as further management   History of Present Illness: [name] is a 73 y.o. year old female here for cystoscopy to complete hematuria evaluation. She was sent home 2 weeks ago w/ probable UTI--culture showed > 2 organisms.  She had some dysuria but full of days ago.  Some hematuria.  Past Medical History:  Diagnosis Date   Abnormal pulmonary function test    Anemia    H/H of 10/30 with a normal MCV in 12/09   Anxiety    Arthritis    Atrial fibrillation (HCC)    Barrett's esophagus    Diagnosed 1995. Last EGD 2016-NO BARRETT'S.    Chest pain    a. 2022 Cath: nl cors; b. 2008 Neg myoview; c. 01/2014 Cath: Nl cors. EF 40%; c. 08/2020 NSTEMI/Cath [facility]): nl cors.   Chronic anticoagulation    Chronic combined systolic and diastolic CHF (congestive heart failure) (HCC)    a. 10/2014 Echo: EF40%; b. 01/2015 Echo: EF 55-60%; c. 11/2020 Echo: EF 55-60%, mod LVH, sev BAE, mild MR, mod TR. PASP ; d. 01/2021 Echo: EF 45-50%, glob HK, mod conc LVH, mod reduced RV fxn, RVSP 60.7mmHg, Mod TR, mild to mod MR, sev dil LA.   Chronic LBP    Surgical intervention in 1996   Diabetes mellitus, type 2 (HCC)    Insulin  therapy; exacerbated by prednisone    Gastroparesis    99% retention 05/2008 on GES   GERD (gastroesophageal reflux disease)    Heart attack (HCC) 08/13/2020   Hiatal hernia    Hyperlipidemia    Hypertension    Hypothyroid    IBS (irritable bowel syndrome)    Obesity    OSA on CPAP    had CPAP and cannot tolerate.   Paroxysmal atrial fibrillation (HCC)    a.CHA2DS2VASc = 6-->eliquis . Also on Amio.   Pulmonary hypertension (HCC)  01/2015   a. 01/2015 RHC: Predominantly pulmonary venous hypertension but may be component of PAH (PA mean 52, PCWP 31); b. 01/2021 Echo: RVSP 60.43mmHg w/ mod reduced RV fxn.   Seizures (HCC)    last seizure was 2 years ago; on keppra  for this; unknown etiology   Syncope    a. Admitted 05/2009; magnetic resonance imagin/ MRA - negative; etiology thought to be orthostasis secondary to drugs and dehydration. b. Syncope 02/2015 also felt 2/2 dehydration.    Past Surgical History:  Procedure Laterality Date   BACK SURGERY  1996   BIOPSY N/A 11/08/2013   Procedure: BIOPSY  / Tissue sampling / ulcers present in small intestine;  Surgeon: Margo LITTIE Haddock, MD;  [facility]: [facility];  Service: Endoscopy;  Laterality: N/A;   CARDIAC CATHETERIZATION  2002   CARDIAC CATHETERIZATION N/A 01/26/2015   Procedure: Right Heart Cath;  Surgeon: Ezra GORMAN Shuck, MD;  Location: [facility] INVASIVE CV LAB;  Service: Cardiovascular;  Laterality: N/A;   CARDIOVASCULAR STRESS TEST  2008   Stress nuclear study   CARDIOVERSION N/A 03/06/2015   Procedure: CARDIOVERSION;  Surgeon: Ezra GORMAN Shuck, MD;  Location: [facility] ENDOSCOPY;  Service: Cardiovascular;  Laterality: N/A;   CARPAL TUNNEL RELEASE  1994   CATARACT EXTRACTION  W/PHACO Right 01/10/2022   Procedure: CATARACT EXTRACTION PHACO AND INTRAOCULAR LENS PLACEMENT (IOC);  Surgeon: Harrie Agent, MD;  Location: [facility] ORS;  Service: Ophthalmology;  Laterality: Right;  CDE: 21.51   CATARACT EXTRACTION W/PHACO Left 01/24/2022   Procedure: CATARACT EXTRACTION PHACO AND INTRAOCULAR LENS PLACEMENT (IOC);  Surgeon: Harrie Agent, MD;  Location: [facility] ORS;  Service: Ophthalmology;  Laterality: Left;  CDE: 17.13   COLONOSCOPY  11/2011   Dr. harvey: Internal hemorrhoids, mild diverticulosis. Random colon biopsies negative.   COLONOSCOPY N/A 11/08/2013   SLF: Normal mucosa in the terminal ileum/The colon IS redundant/  Moderate diverticulosis throughout the entire colon. ileum bx benign. colon  bx benign   CRYOTHERAPY  01/23/2021   Procedure: CRYOTHERAPY;  Surgeon: Claudene Toribio BROCKS, MD;  Location: [facility] ENDOSCOPY;  Service: Pulmonary;;   CRYOTHERAPY  01/24/2021   Procedure: CHANA;  Surgeon: Claudene Toribio BROCKS, MD;  Location: [facility] ENDOSCOPY;  Service: Pulmonary;;   CRYOTHERAPY  01/26/2021   Procedure: CHANA;  Surgeon: Alaine Vicenta NOVAK, MD;  Location: [facility] ENDOSCOPY;  Service: Cardiopulmonary;;   ESOPHAGOGASTRODUODENOSCOPY  2008   Barrett's without dysplasia. esphagus dilated. antral erosions, h.pylori serologies negative.   ESOPHAGOGASTRODUODENOSCOPY  11/2011   Dr. harvey: Barrett's esophagus, mild gastritis, diverticulum in the second portion of the duodenum repeat EGD 3 years. Small bowel biopsies negative. Gastric biopsy show reactive gastropathy but no H. pylori. Esophageal biopsies consistent with GERD. Next EGD 11/2014   ESOPHAGOGASTRODUODENOSCOPY N/A 11/21/2014   DOQ:fpoi non-erosive gastritis/irregular z-line   ESOPHAGOGASTRODUODENOSCOPY  03/2022   FOREIGN BODY REMOVAL  01/23/2021   Procedure: FOREIGN BODY REMOVAL;  Surgeon: Claudene Toribio BROCKS, MD;  Location: [facility] ENDOSCOPY;  Service: Pulmonary;;   FOREIGN BODY REMOVAL  01/24/2021   Procedure: FOREIGN BODY REMOVAL;  Surgeon: Claudene Toribio BROCKS, MD;  Location: [facility] ENDOSCOPY;  Service: Pulmonary;;   GIVENS CAPSULE STUDY  12/07/2011   Proximal small bowel, rare AVM. Distal small bowel, multiple ulcers noted   GIVENS CAPSULE STUDY N/A 09/27/2013   Distal small bowel ulcers extending to TI.   GIVENS CAPSULE STUDY N/A 10/10/2013   Procedure: GIVENS CAPSULE STUDY;  Surgeon: Margo LITTIE harvey, MD;  Location: [facility];  Service: Endoscopy;  Laterality: N/A;  7:30   HEMOSTASIS CONTROL  01/24/2021   Procedure: HEMOSTASIS CONTROL;  Surgeon: Claudene Toribio BROCKS, MD;  Location: [facility] ENDOSCOPY;  Service: Pulmonary;;   HEMOSTASIS CONTROL  01/26/2021   Procedure: HEMOSTASIS CONTROL;  Surgeon: Alaine Vicenta NOVAK, MD;  Location: [facility] ENDOSCOPY;  Service:  Cardiopulmonary;;   IR GASTROSTOMY TUBE MOD SED  01/11/2021   IR GASTROSTOMY TUBE REMOVAL  04/13/2021   IR KYPHO THORACIC WITH BONE BIOPSY  02/09/2018   KNEE ARTHROSCOPY WITH MEDIAL MENISECTOMY Right 06/09/2016   Procedure: KNEE ARTHROSCOPY WITH MEDIAL MENISECTOMY;  Surgeon: Taft FORBES Minerva, MD;  Location: [facility]S;  Service: Orthopedics;  Laterality: Right;  medial and lateral menisectomy   LAMINECTOMY  1995   L4-L5   LAPAROSCOPIC CHOLECYSTECTOMY  1990s   LEFT HEART CATHETERIZATION WITH CORONARY ANGIOGRAM  01/10/2014   Procedure: LEFT HEART CATHETERIZATION WITH CORONARY ANGIOGRAM;  Surgeon: Victory LELON Claudene DOUGLAS, MD;  Location: [facility] CATH LAB;  Service: Cardiovascular;;   MICROLARYNGOSCOPY WITH LASER AND BALLOON DILATION N/A 11/17/2021   Procedure: MICROLARYNGOSCOPY WITH CO2 LASER, SUBGLOTTIC LARYNGEAL MASS BIOSPY;  Surgeon: Jesus Oliphant, MD;  Location: [facility] OR;  Service: ENT;  Laterality: N/A;   RIGHT HEART CATHETERIZATION N/A 01/10/2014   Procedure: RIGHT HEART CATH;  Surgeon: Victory LELON Claudene DOUGLAS, MD;  Location: [facility] CATH LAB;  Service: Cardiovascular;  Laterality: N/A;   TOTAL ABDOMINAL HYSTERECTOMY  1999   TRACHEOSTOMY REVISION N/A 07/18/2021   Procedure: TRACHEOSTOMY REVISION;  Surgeon: Roark Rush, MD;  Location: [facility] OR;  Service: ENT;  Laterality: N/A;   TRACHEOSTOMY TUBE PLACEMENT N/A 09/01/2017   Procedure: TRACHEOSTOMY;  Surgeon: Ethyl Lonni BRAVO, MD;  Location: [facility] OR;  Service: ENT;  Laterality: N/A;   VIDEO BRONCHOSCOPY Bilateral 01/23/2021   Procedure: VIDEO BRONCHOSCOPY WITHOUT FLUORO;  Surgeon: Claudene Toribio BROCKS, MD;  Location: [facility] ENDOSCOPY;  Service: Pulmonary;  Laterality: Bilateral;   VIDEO BRONCHOSCOPY Bilateral 01/24/2021   Procedure: VIDEO BRONCHOSCOPY WITHOUT FLUORO;  Surgeon: Claudene Toribio BROCKS, MD;  Location: [facility] ENDOSCOPY;  Service: Pulmonary;  Laterality: Bilateral;   VIDEO BRONCHOSCOPY N/A 01/26/2021   Procedure: VIDEO BRONCHOSCOPY WITHOUT FLUORO;  Surgeon: Alaine Vicenta NOVAK,  MD;  Location: [facility] ENDOSCOPY;  Service: Cardiopulmonary;  Laterality: N/A;    Home Medications:  (Not in a hospital admission)   Allergies:  Allergies  Allergen Reactions   Codeine Nausea And Vomiting and Other (See Comments)    Hallucinations   Dilaudid  [Hydromorphone  Hcl] Other (See Comments)    Syncope    Reglan  [Metoclopramide ] Other (See Comments)    Dyskinesia    Fentanyl      Other Reaction(s): Not available   Levsin  [Hyoscyamine ] Diarrhea    Caused worse diarrhea    Family History  Problem Relation Age of Onset   Hypertension Mother    Alzheimer's disease Mother    Stroke Mother    Heart attack Mother    Breast cancer Sister    Hypertension Other    Heart disease Neg Hx    Colon cancer Neg Hx    Liver disease Neg Hx     Social History:  reports that she quit smoking about 30 years ago. Her smoking use included cigarettes. She started smoking about 58 years ago. She has a 6.8 pack-year smoking history. She has never used smokeless tobacco. She reports that she does not currently use alcohol. She reports that she does not use drugs.  ROS: A complete review of systems was performed.  All systems are negative except for pertinent findings as noted.    I have reviewed prior pt notes  I have reviewed notes from referring/previous physicians  I have reviewed urinalysis results  I have independently reviewed prior imaging--hematuria CT images from 6/25 again reviewed.  Findings: IMPRESSION: 1. Eccentric bladder wall thickening and mucosal hyperenhancement in the posterior right aspect of the bladder, up to 1.2 cm in thickness. This may reflect unusual focal infectious or inflammatory cystitis but is worrisome for sessile malignancy. 2. Prominent bilateral external iliac and pelvic sidewall lymph nodes, nonspecific, possibly reactive although nodal metastatic disease not excluded. 3. No other evidence of lymphadenopathy or metastatic disease in the abdomen or  pelvis. 4. No urinary tract calculi or hydronephrosis. 5. Hepatomegaly. 6. Status post cholecystectomy and hysterectomy.  I have reviewed prior urine culture --> 2 organisms present   Indication: Bladder abnormality  After informed consent and discussion of the procedure and its risks, pt was positioned and prepped in the standard fashion.  Cystoscopy was performed with a flexible cystoscope.   Findings:  Urethra: Normal Ureteral orifices: Not specifically seen Bladder: Mild to moderate erythema of the right trigonal region.  Some of this was obscured by sediment.  No other urothelial lesions were noted.

## 2024-02-13 ENCOUNTER — Encounter: Payer: Self-pay | Admitting: Urology

## 2024-02-13 ENCOUNTER — Ambulatory Visit: Admitting: Urology

## 2024-02-13 VITALS — BP 106/71 | HR 79

## 2024-02-13 DIAGNOSIS — N3001 Acute cystitis with hematuria: Secondary | ICD-10-CM | POA: Diagnosis not present

## 2024-02-13 DIAGNOSIS — R9341 Abnormal radiologic findings on diagnostic imaging of renal pelvis, ureter, or bladder: Secondary | ICD-10-CM | POA: Diagnosis not present

## 2024-02-13 DIAGNOSIS — R310 Gross hematuria: Secondary | ICD-10-CM

## 2024-02-13 LAB — MICROSCOPIC EXAMINATION
RBC, Urine: >30 /HPF — AB (ref 0–2)
WBC, UA: >30 /HPF — AB (ref 0–5)

## 2024-02-13 LAB — URINALYSIS, ROUTINE W REFLEX MICROSCOPIC
Bilirubin, UA: NEGATIVE
Ketones, UA: NEGATIVE
Nitrite, UA: POSITIVE — AB
Specific Gravity, UA: 1.02 (ref 1.005–1.030)
Urobilinogen, Ur: 0.2 mg/dL (ref 0.2–1.0)
pH, UA: 5.5 (ref 5.0–7.5)

## 2024-02-13 MED ORDER — CIPROFLOXACIN HCL 500 MG PO TABS
500.0000 mg | ORAL_TABLET | Freq: Once | ORAL | Status: AC
Start: 2024-02-13 — End: 2024-02-13
  Administered 2024-02-13 (×2): 500 mg via ORAL

## 2024-02-13 NOTE — Progress Notes (Addendum)
 Trk: [phone] Picked up on 02/14/2024 @ 1:45pm

## 2024-02-16 ENCOUNTER — Telehealth: Payer: Self-pay

## 2024-02-16 NOTE — Telephone Encounter (Signed)
 Called daughter to let her know the wrong lab analysis was mailed off and we need pt to come in for a UA drop off to resend correct lab pt daughter voiced her understanding and pt was scheduled for 08/28 @2  pm

## 2024-02-20 ENCOUNTER — Telehealth: Payer: Self-pay | Admitting: Family Medicine

## 2024-02-20 NOTE — Telephone Encounter (Signed)
 Copied from CRM [id number]. Topic: General - Other >> Feb 20, 2024  1:26 PM Gustabo D wrote: [facility] [place] calling about a order that was [phone] Order number [phone]

## 2024-02-22 ENCOUNTER — Telehealth: Payer: Self-pay | Admitting: Family Medicine

## 2024-02-22 ENCOUNTER — Other Ambulatory Visit: Payer: Self-pay | Admitting: Family Medicine

## 2024-02-22 MED ORDER — LORAZEPAM 1 MG PO TABS: ORAL_TABLET | ORAL | 0 refills | Status: DC

## 2024-02-22 NOTE — Telephone Encounter (Unsigned)
 Copied from CRM #[id number]. Topic: Clinical - Medication Refill >> Feb 22, 2024 10:25 AM Emylou G wrote: Medication: LORazepam  (ATIVAN ) 1 MG tablet  Has the patient contacted their pharmacy? Yes (Agent: If no, request that the patient contact the pharmacy for the refill. If patient does not wish to contact the pharmacy document the reason why and proceed with request.) (Agent: If yes, when and what did the pharmacy advise?) said to call us   This is the patient's preferred pharmacy:  [facility] [place], [place] - [id number] [place] [address] Phone: [phone] Fax: [phone]  Is this the correct pharmacy for this prescription? Yes If no, delete pharmacy and type the correct one.   Has the prescription been filled recently? No  Is the patient out of the medication? No.. has a few  Has the patient been seen for an appointment in the last year OR does the patient have an upcoming appointment? Yes  Can we respond through MyChart? Yes  Agent: Please be advised that Rx refills may take up to 3 business days. We ask that you follow-up with your pharmacy.

## 2024-02-23 ENCOUNTER — Encounter: Payer: Self-pay | Admitting: Radiology

## 2024-02-27 NOTE — Progress Notes (Unsigned)
 Referring Provider: Del Orbe Polanco, Ilian* Primary Care Physician:  [name], FNP Primary GI Physician: Dr. Cindie  Chief Complaint  Patient presents with   Diarrhea    HPI:   [name] is a 73 y.o. female with history of CHF, pulmonary hypertension, atrial fibrillation on Eliquis , restrictive lung disease, hospitalized in June 2022 with respiratory failure and required permanent tracheostomy, IDA previously following with hematology and receiving IV iron  due to oral iron  intolerance, chronic lower abdominal pain worsened postprandially with negative mesenteric US  in 2021, gastroparesis, GERD, IBS with alternating constipation and diarrhea,  colitis in October 2024 with no follow-up colonoscopy per patient preference due to high risk, pending today for follow-up of loose stools.   Last seen in the office 11/08/2023.  Reported diarrhea mainly occurring in the morning.  On average, 3 BMs per day, but could have 5 or 6 in the 36-hour period.  Stools were very loose/mushy and sometimes uncontrollable.  Yogurt increased stool frequency.  Ice cream caused lower abdominal pain.  She was taking 3 Benefiber capsules daily, dicyclomine  4 times daily, 1-2 Imodium  about 4 times a week.  Noted she had been on antibiotics in February for community-acquired pneumonia and hospitalized at that time as well.  She was also on Keflex  for her throat.  Zoloft  had also been increased.  Stool studies nor TSH were completed as recommended.  In the interim, patient has been prescribed Keflex  for UTI.  She underwent cystoscopy for hematuria and abnormal bladder on CT.  She was found to have erythema in trigonal region.    Today:  Diarrhea started last week. Has blow outs. All of a sudden she gets urge to go and can't get to the bathroom in time. This fluctuates every couple of weeks between this and what she calls normal.  Takes more imodium  when blow outs start. At baseline, takes 3 imodium   per week. Passes a lot of stool when these bowel movements occur. Otherwise, has 1-2 Bms per day. Can vary from formed to loose. Small amount. Incomplete.    Little blood on toilet tissue. Every once in a while. Occasional rectal itching. First noticed this starting in June around the same time as hematuria.  Rectal bleeding tends to occur on the days that she has diarrhea.  Had some abdominal pain last week. Lower abdomen. Lasted 3 days.   Still taking dicyclomine  4 times a day.    Reviewed recent CT hematuria work-up 12/27/23 which showed moderate stool burden throughout colon and rectum.     Prior endoscopic evaluation:  EGD in May 2016 due to anemia and history of Barrett's without evidence of Barrett's. Last colonoscopy in May 2015 with redundant colon, moderate diverticulosis throughout the entire colon.  Capsule in April 2015 with idiopathic small bowel ulcers that have been present since 2013.  *Had been taking Aleve.    CT A/P with contrast 04/29/2023 showed long segment colonic wall thickening involving the ascending and proximal transverse colon as well as distal sigmoid colon and rectum consistent with nonspecific colitis.  C. difficile and GI pathogen panel were negative.  No reports of syncope though she did have soft blood pressures on admission.  She was treated with ceftriaxone  and Flagyl  with improvement in abdominal pain and diarrhea.  Recommended holding off on endoscopic evaluation unless worsening diarrhea or rectal bleeding.  Recommended checking stool studies, strict lactose-free diet, continue other medications.  If stool testing negative and diarrhea continues, consider course  of Xifaxan .    Past Medical History:  Diagnosis Date   Abnormal pulmonary function test    Anemia    H/H of 10/30 with a normal MCV in 12/09   Anxiety    Arthritis    Atrial fibrillation (HCC)    Barrett's esophagus    Diagnosed 1995. Last EGD 2016-NO BARRETT'S.    Chest pain    a. 2022  Cath: nl cors; b. 2008 Neg myoview; c. 01/2014 Cath: Nl cors. EF 40%; c. 08/2020 NSTEMI/Cath [facility]): nl cors.   Chronic anticoagulation    Chronic combined systolic and diastolic CHF (congestive heart failure) (HCC)    a. 10/2014 Echo: EF40%; b. 01/2015 Echo: EF 55-60%; c. 11/2020 Echo: EF 55-60%, mod LVH, sev BAE, mild MR, mod TR. PASP ; d. 01/2021 Echo: EF 45-50%, glob HK, mod conc LVH, mod reduced RV fxn, RVSP 60.60mmHg, Mod TR, mild to mod MR, sev dil LA.   Chronic LBP    Surgical intervention in 1996   Diabetes mellitus, type 2 (HCC)    Insulin  therapy; exacerbated by prednisone    Gastroparesis    99% retention 05/2008 on GES   GERD (gastroesophageal reflux disease)    Heart attack (HCC) 08/13/2020   Hiatal hernia    Hyperlipidemia    Hypertension    Hypothyroid    IBS (irritable bowel syndrome)    Obesity    OSA on CPAP    had CPAP and cannot tolerate.   Paroxysmal atrial fibrillation (HCC)    a.CHA2DS2VASc = 6-->eliquis . Also on Amio.   Pulmonary hypertension (HCC) 01/2015   a. 01/2015 RHC: Predominantly pulmonary venous hypertension but may be component of PAH (PA mean 52, PCWP 31); b. 01/2021 Echo: RVSP 60.53mmHg w/ mod reduced RV fxn.   Seizures (HCC)    last seizure was 2 years ago; on keppra  for this; unknown etiology   Syncope    a. Admitted 05/2009; magnetic resonance imagin/ MRA - negative; etiology thought to be orthostasis secondary to drugs and dehydration. b. Syncope 02/2015 also felt 2/2 dehydration.    Past Surgical History:  Procedure Laterality Date   BACK SURGERY  1996   BIOPSY N/A 11/08/2013   Procedure: BIOPSY  / Tissue sampling / ulcers present in small intestine;  Surgeon: Margo LITTIE Haddock, MD;  [facility];  Service: Endoscopy;  Laterality: N/A;   CARDIAC CATHETERIZATION  2002   CARDIAC CATHETERIZATION N/A 01/26/2015   Procedure: Right Heart Cath;  Surgeon: Ezra GORMAN Shuck, MD;  Location: [facility] INVASIVE CV LAB;  Service: Cardiovascular;  Laterality:  N/A;   CARDIOVASCULAR STRESS TEST  2008   Stress nuclear study   CARDIOVERSION N/A 03/06/2015   Procedure: CARDIOVERSION;  Surgeon: Ezra GORMAN Shuck, MD;  Location: [facility] ENDOSCOPY;  Service: Cardiovascular;  Laterality: N/A;   CARPAL TUNNEL RELEASE  1994   CATARACT EXTRACTION W/PHACO Right 01/10/2022   Procedure: CATARACT EXTRACTION PHACO AND INTRAOCULAR LENS PLACEMENT (IOC);  Surgeon: Harrie Agent, MD;  Location: [facility] ORS;  Service: Ophthalmology;  Laterality: Right;  CDE: 21.51   CATARACT EXTRACTION W/PHACO Left 01/24/2022   Procedure: CATARACT EXTRACTION PHACO AND INTRAOCULAR LENS PLACEMENT (IOC);  Surgeon: Harrie Agent, MD;  Location: [facility] ORS;  Service: Ophthalmology;  Laterality: Left;  CDE: 17.13   COLONOSCOPY  11/2011   Dr. haddock: Internal hemorrhoids, mild diverticulosis. Random colon biopsies negative.   COLONOSCOPY N/A 11/08/2013   SLF: Normal mucosa in the terminal ileum/The colon IS redundant/  Moderate diverticulosis throughout the entire colon. ileum  bx benign. colon bx benign   CRYOTHERAPY  01/23/2021   Procedure: CRYOTHERAPY;  Surgeon: Claudene Toribio BROCKS, MD;  Location: [facility] ENDOSCOPY;  Service: Pulmonary;;   CRYOTHERAPY  01/24/2021   Procedure: CHANA;  Surgeon: Claudene Toribio BROCKS, MD;  Location: [facility] ENDOSCOPY;  Service: Pulmonary;;   CRYOTHERAPY  01/26/2021   Procedure: CHANA;  Surgeon: Alaine Vicenta NOVAK, MD;  Location: [facility] ENDOSCOPY;  Service: Cardiopulmonary;;   ESOPHAGOGASTRODUODENOSCOPY  2008   Barrett's without dysplasia. esphagus dilated. antral erosions, h.pylori serologies negative.   ESOPHAGOGASTRODUODENOSCOPY  11/2011   Dr. Harvey: Barrett's esophagus, mild gastritis, diverticulum in the second portion of the duodenum repeat EGD 3 years. Small bowel biopsies negative. Gastric biopsy show reactive gastropathy but no H. pylori. Esophageal biopsies consistent with GERD. Next EGD 11/2014   ESOPHAGOGASTRODUODENOSCOPY N/A 11/21/2014   DOQ:fpoi non-erosive  gastritis/irregular z-line   ESOPHAGOGASTRODUODENOSCOPY  03/2022   FOREIGN BODY REMOVAL  01/23/2021   Procedure: FOREIGN BODY REMOVAL;  Surgeon: Claudene Toribio BROCKS, MD;  Location: [facility] ENDOSCOPY;  Service: Pulmonary;;   FOREIGN BODY REMOVAL  01/24/2021   Procedure: FOREIGN BODY REMOVAL;  Surgeon: Claudene Toribio BROCKS, MD;  Location: [facility] ENDOSCOPY;  Service: Pulmonary;;   GIVENS CAPSULE STUDY  12/07/2011   Proximal small bowel, rare AVM. Distal small bowel, multiple ulcers noted   GIVENS CAPSULE STUDY N/A 09/27/2013   Distal small bowel ulcers extending to TI.   GIVENS CAPSULE STUDY N/A 10/10/2013   Procedure: GIVENS CAPSULE STUDY;  Surgeon: Margo LITTIE Harvey, MD;  [facility];  Service: Endoscopy;  Laterality: N/A;  7:30   HEMOSTASIS CONTROL  01/24/2021   Procedure: HEMOSTASIS CONTROL;  Surgeon: Claudene Toribio BROCKS, MD;  Location: [facility] ENDOSCOPY;  Service: Pulmonary;;   HEMOSTASIS CONTROL  01/26/2021   Procedure: HEMOSTASIS CONTROL;  Surgeon: Alaine Vicenta NOVAK, MD;  Location: [facility] ENDOSCOPY;  Service: Cardiopulmonary;;   IR GASTROSTOMY TUBE MOD SED  01/11/2021   IR GASTROSTOMY TUBE REMOVAL  04/13/2021   IR KYPHO THORACIC WITH BONE BIOPSY  02/09/2018   KNEE ARTHROSCOPY WITH MEDIAL MENISECTOMY Right 06/09/2016   Procedure: KNEE ARTHROSCOPY WITH MEDIAL MENISECTOMY;  Surgeon: Taft FORBES Minerva, MD;  Location: [facility]S;  Service: Orthopedics;  Laterality: Right;  medial and lateral menisectomy   LAMINECTOMY  1995   L4-L5   LAPAROSCOPIC CHOLECYSTECTOMY  1990s   LEFT HEART CATHETERIZATION WITH CORONARY ANGIOGRAM  01/10/2014   Procedure: LEFT HEART CATHETERIZATION WITH CORONARY ANGIOGRAM;  Surgeon: Victory LELON Claudene DOUGLAS, MD;  Location: [facility] CATH LAB;  Service: Cardiovascular;;   MICROLARYNGOSCOPY WITH LASER AND BALLOON DILATION N/A 11/17/2021   Procedure: MICROLARYNGOSCOPY WITH CO2 LASER, SUBGLOTTIC LARYNGEAL MASS BIOSPY;  Surgeon: Jesus Oliphant, MD;  Location: [facility] OR;  Service: ENT;  Laterality: N/A;   RIGHT  HEART CATHETERIZATION N/A 01/10/2014   Procedure: RIGHT HEART CATH;  Surgeon: Victory LELON Claudene DOUGLAS, MD;  Location: [facility] CATH LAB;  Service: Cardiovascular;  Laterality: N/A;   TOTAL ABDOMINAL HYSTERECTOMY  1999   TRACHEOSTOMY REVISION N/A 07/18/2021   Procedure: TRACHEOSTOMY REVISION;  Surgeon: Roark Rush, MD;  Location: [facility] OR;  Service: ENT;  Laterality: N/A;   TRACHEOSTOMY TUBE PLACEMENT N/A 09/01/2017   Procedure: TRACHEOSTOMY;  Surgeon: Ethyl Lonni FORBES, MD;  Location: [facility] OR;  Service: ENT;  Laterality: N/A;   VIDEO BRONCHOSCOPY Bilateral 01/23/2021   Procedure: VIDEO BRONCHOSCOPY WITHOUT FLUORO;  Surgeon: Claudene Toribio BROCKS, MD;  Location: [facility] ENDOSCOPY;  Service: Pulmonary;  Laterality: Bilateral;   VIDEO BRONCHOSCOPY Bilateral 01/24/2021   Procedure: VIDEO BRONCHOSCOPY  WITHOUT FLUORO;  Surgeon: Claudene Toribio BROCKS, MD;  Location: [facility] ENDOSCOPY;  Service: Pulmonary;  Laterality: Bilateral;   VIDEO BRONCHOSCOPY N/A 01/26/2021   Procedure: VIDEO BRONCHOSCOPY WITHOUT FLUORO;  Surgeon: Alaine Vicenta NOVAK, MD;  Location: [facility] ENDOSCOPY;  Service: Cardiopulmonary;  Laterality: N/A;    Current Outpatient Medications  Medication Sig Dispense Refill   acetaminophen  (TYLENOL ) 650 MG CR tablet Take 1,300 mg by mouth 2 (two) times daily.     atorvastatin  (LIPITOR ) 40 MG tablet Take 1 tablet (40 mg total) by mouth daily. 90 tablet 3   busPIRone  (BUSPAR ) 30 MG tablet Take 1 tablet (30 mg total) by mouth in the morning and at bedtime. 60 tablet 3   Cholecalciferol  (VITAMIN D -3 PO) Take 1 tablet by mouth daily in the afternoon.     Continuous Glucose Sensor (FREESTYLE LIBRE 2 SENSOR) MISC APPLY ONE SENSOR TO THE SKIN EVERY 14 DAYS AS DIRECTED TO CONTINUOUSLY MONITOR GLUCOSE LEVELS 3 each 2   dicyclomine  (BENTYL ) 10 MG capsule TAKE 1 CAPSULE BY MOUTH UP TO 4 TIMES DAILY BEFORE MEALS AND AT BEDTIME (STOP IF CONSTIPATION). 120 capsule 3   donepezil  (ARICEPT ) 5 MG tablet TAKE 1 TABLET BY MOUTH AT BEDTIME FOR  DEMENTIA. 30 tablet 5   ELIQUIS  5 MG TABS tablet TAKE 1 TABLET BY MOUTH TWICE DAILY AS A BLOOD THINNER. 60 tablet 0   furosemide  (LASIX ) 20 MG tablet Take 1 tablet (20 mg total) by mouth daily. 100 tablet 3   gabapentin  (NEURONTIN ) 100 MG capsule TAKE 1 TABLET BY MOUTH THREE TIMES DAILY FOR NEUROPATHIC PAIN. 90 capsule 5   hydrocortisone  (ANUSOL -HC) 2.5 % rectal cream Place 1 Application rectally 2 (two) times daily. 30 g 1   insulin  aspart (NOVOLOG  FLEXPEN) 100 UNIT/ML FlexPen Inject 0-8 Units into the skin 3 (three) times daily with meals. 15 mL 4   insulin  glargine (LANTUS  SOLOSTAR) 100 UNIT/ML Solostar Pen Inject 20 Units into the skin at bedtime. 15 mL 4   ipratropium (ATROVENT ) 0.03 % nasal spray Place 2 sprays into both nostrils 3 (three) times daily as needed for rhinitis. 30 mL 12   JARDIANCE  10 MG TABS tablet TAKE ONE TABLET BY MOUTH EVERY DAY. 30 tablet 2   levothyroxine  (SYNTHROID ) 150 MCG tablet Take 1 tablet (150 mcg total) by mouth daily before breakfast. 30 tablet 0   LORazepam  (ATIVAN ) 1 MG tablet Take 1 to 1 1/2 tablets once daily as needed for anxiety or to aid with sleep 30 tablet 0   metoprolol  tartrate (LOPRESSOR ) 25 MG tablet TAKE 1 TABLET BY MOUTH TWICE DAILY FOR HIGH BLOOD PRESSURE OR HEART RATE CONTROL. 60 tablet 5   nitroGLYCERIN  (NITROSTAT ) 0.4 MG SL tablet Place 0.4 mg under the tongue every 5 (five) minutes as needed for chest pain.     pantoprazole  (PROTONIX ) 40 MG tablet TAKE 1 TABLET BY MOUTH TWICE DAILY FOR ACID REFLUX. 60 tablet 5   potassium chloride  (KLOR-CON  M) 10 MEQ tablet TAKE 2 TABLETS BY MOUTH DAILY TO REPLACE POTASSIUM. 60 tablet 2   sertraline  (ZOLOFT ) 50 MG tablet Take 1 tablet (50 mg total) by mouth daily. 30 tablet 3   traMADol  (ULTRAM ) 50 MG tablet Take 1 tablet (50 mg total) by mouth 3 (three) times daily as needed. (Patient taking differently: Take 100 mg by mouth 3 (three) times daily.) 90 tablet 0   trolamine salicylate (ASPERCREME) 10 % cream  Apply topically.     No current facility-administered medications for this visit.  Allergies as of 02/29/2024 - Review Complete 02/29/2024  Allergen Reaction Noted   Codeine Nausea And Vomiting and Other (See Comments) 11/28/2019   Dilaudid  [hydromorphone  hcl] Other (See Comments) 12/31/2015   Reglan  [metoclopramide ] Other (See Comments) 06/04/2014   Fentanyl   11/08/2023   Levsin  [hyoscyamine ] Diarrhea 09/05/2019    Family History  Problem Relation Age of Onset   Hypertension Mother    Alzheimer's disease Mother    Stroke Mother    Heart attack Mother    Breast cancer Sister    Hypertension Other    Heart disease Neg Hx    Colon cancer Neg Hx    Liver disease Neg Hx     Social History   Socioeconomic History   Marital status: Married    Spouse name: Not on file   Number of children: Not on file   Years of education: Not on file   Highest education level: Not on file  Occupational History   Occupation: Passenger transport manager at [employer]    Employer: [employer]    Comment: disabled    Employer: RETIRED  Tobacco Use   Smoking status: Former    Current packs/day: 0.00    Average packs/day: 0.3 packs/day for 27.3 years (6.8 ttl pk-yrs)    Types: Cigarettes    Start date: 02/26/1966    Quit date: 06/30/1993    Years since quitting: 30.6   Smokeless tobacco: Never   Tobacco comments:    quit in 1984  Vaping Use   Vaping status: Never Used  Substance and Sexual Activity   Alcohol use: Not Currently    Comment: Occasional alcohol 30 years ago (09/02/21)   Drug use: No   Sexual activity: Yes    Birth control/protection: None, Surgical  Other Topics Concern   Not on file  Social History Narrative   Sedentary   4 children, blended family   Social Drivers of Corporate investment banker Strain: Not on file  Food Insecurity: No Food Insecurity (08/06/2023)   Hunger Vital Sign    Worried About Running Out of Food in the Last Year: Never true    Ran Out of Food in the Last Year:  Never true  Transportation Needs: No Transportation Needs (08/06/2023)   PRAPARE - Administrator, Civil Service (Medical): No    Lack of Transportation (Non-Medical): No  Physical Activity: Not on file  Stress: Not on file  Social Connections: Not on file    Review of Systems: Gen: Denies fever, chills, cold or flu like symptoms, pre-syncope, or syncope.  GI: See HPI. Heme: See HPI  Physical Exam: BP 117/68 (BP Location: Right Arm, Patient Position: Sitting, Cuff Size: Normal)   Pulse 67   Temp (!) 97.5 F (36.4 C) (Temporal)   Ht 5' 6 (1.676 m)   Wt 154 lb 12.8 oz (70.2 kg)   SpO2 100%   BMI 24.99 kg/m  General:   Alert and oriented. No distress noted. Pleasant and cooperative. Tracheostomy present.   Head:  Normocephalic and atraumatic. Eyes:  Conjuctiva clear without scleral icterus. Heart:  S1, S2 present without murmurs appreciated. Lungs:  Clear to auscultation bilaterally. No wheezes, rales, or rhonchi. No distress.  Abdomen:  +BS, soft, non-tender and non-distended. No rebound or guarding. No HSM or masses noted. Rectal: External hemorrhoids.  Msk:  Symmetrical without gross deformities. Normal posture. Neurologic:  Alert and  oriented x4. Psych:  Normal mood and affect.    Assessment:  73 y.o. female  with history of CHF, pulmonary hypertension, atrial fibrillation on Eliquis , restrictive lung disease, hospitalized in June 2022 with respiratory failure and required permanent tracheostomy, IDA previously following with hematology and receiving IV iron  due to oral iron  intolerance, chronic lower abdominal pain worsened postprandially with negative mesenteric US  in 2021, gastroparesis, GERD, IBS with alternating constipation and diarrhea,  colitis in October 2024 with no follow-up colonoscopy per patient preference due to high risk, pending today for follow-up of loose stools with chief complaint of diarrhea/fecal incontinence.   Diarrhea/fecal incontinence:   Chronic, intermittent. Suspect we are dealing with baseline constipation with overflow diarrhea as patient reports going 1-2 weeks with small, incomplete bowel movements with stools that can be formed to loose followed by episodes of diarrhea with blowouts and intermittent fecal incontinence due to urgency. Had some abdominal pain last week, but this has resolved and abdominal exam is benign. That may have been more related to chronic IBS and likely constipation. I also recent CT for hematuria workup on 12/27/2023 noting moderate burden of stool throughout the colon and rectum.   Will obtain abdominal x-ray today to confirm stool burden as patient is chronically very concerned about diarrhea and frequently takes Imodium  which is likely the etiology of her baseline constipation.  If x-ray confirms presence of stool burden,  I will have her stop imodium  completely and complete a mini MiraLAX  bowel prep, then continue with benefiber daily and monitor bowel function. It is possible bowel function will improve when imodium  is held or she may end up needing MiraLAX  daily.  Rectal bleeding:  Intermittent toilet tissue hematochezia occurring when having diarrhea/more frequent bowel movements.  Etiology likely related to external hemorrhoids seen on rectal exam today.  Discussed with patient that I cannot rule out other etiologies such as colon polyps or malignancy.  We discussed the possibility of a colonoscopy to be thorough, but patient prefers to avoid this if possible.  Therefore, I will prescribe Anusol  rectal cream for her to use twice a day as needed, and I am hopeful that symptoms will improve as we improve her bowel function.  However, if symptoms persist or worsen, would recommend a colonoscopy.    Plan:  DG abdomen Anusol  rectal cream BID x 5-10 days prn.  Further recommendations following x-ray result. If stool burden confirmed, recommend stop imodium  completely and complete a mini MiraLAX  bowel  prep, then continue with benefiber daily and monitor bowel function. It is possible bowel function will improve when imodium  is held or she may end up needing MiraLAX  daily. Follow-up TBD.    Josette Centers, PA-C [facility] Gastroenterology 02/29/2024

## 2024-02-29 ENCOUNTER — Encounter: Payer: Self-pay | Admitting: Gastroenterology

## 2024-02-29 ENCOUNTER — Ambulatory Visit (HOSPITAL_COMMUNITY)
Admission: RE | Admit: 2024-02-29 | Discharge: 2024-02-29 | Disposition: A | Discharge status: Home / Self Care | Source: Ambulatory Visit | Attending: Gastroenterology | Admitting: Gastroenterology

## 2024-02-29 ENCOUNTER — Telehealth: Payer: Self-pay

## 2024-02-29 ENCOUNTER — Ambulatory Visit (INDEPENDENT_AMBULATORY_CARE_PROVIDER_SITE_OTHER): Admitting: Gastroenterology

## 2024-02-29 ENCOUNTER — Ambulatory Visit (INDEPENDENT_AMBULATORY_CARE_PROVIDER_SITE_OTHER)

## 2024-02-29 VITALS — BP 117/68 | HR 67 | Temp 97.5°F | Ht 66.0 in | Wt 154.8 lb

## 2024-02-29 DIAGNOSIS — R310 Gross hematuria: Secondary | ICD-10-CM

## 2024-02-29 DIAGNOSIS — K625 Hemorrhage of anus and rectum: Secondary | ICD-10-CM

## 2024-02-29 DIAGNOSIS — R197 Diarrhea, unspecified: Secondary | ICD-10-CM | POA: Insufficient documentation

## 2024-02-29 DIAGNOSIS — R159 Full incontinence of feces: Secondary | ICD-10-CM

## 2024-02-29 DIAGNOSIS — K589 Irritable bowel syndrome without diarrhea: Secondary | ICD-10-CM

## 2024-02-29 MED ORDER — HYDROCORTISONE (PERIANAL) 2.5 % EX CREA: 1.0000 | TOPICAL_CREAM | Freq: Two times a day (BID) | CUTANEOUS | 1 refills | Status: AC

## 2024-02-29 NOTE — Patient Instructions (Signed)
 Please go to [facility] to have an X-ray of your abdomen. This is to help us  see if you are actually constipated and the diarrhea you experience is secondary to overflow.   Once I have the X-ray results back, I will give you recommendations on a bowel regimen.   I suspect the scant amount of blood you see on toilet tissue intermittently is secondary to hemorrhoids. You can use anusol  rectal cream twice a day for 5-10 days when this occurs. I am hopeful this will resolve one we get your bowels moving normally.   Josette Centers, PA-C [facility] Gastroenterology

## 2024-02-29 NOTE — Telephone Encounter (Signed)
 Pickup Request Number:  [phone]

## 2024-03-01 ENCOUNTER — Ambulatory Visit: Payer: Self-pay | Admitting: Gastroenterology

## 2024-03-01 LAB — URINALYSIS, ROUTINE W REFLEX MICROSCOPIC
Bilirubin, UA: NEGATIVE
Ketones, UA: NEGATIVE
Nitrite, UA: POSITIVE — AB
Specific Gravity, UA: 1.02 (ref 1.005–1.030)
Urobilinogen, Ur: 0.2 mg/dL (ref 0.2–1.0)
pH, UA: 6 (ref 5.0–7.5)

## 2024-03-01 LAB — MICROSCOPIC EXAMINATION: WBC, UA: >30 /HPF — AB (ref 0–5)

## 2024-03-05 LAB — URINE CULTURE

## 2024-03-06 ENCOUNTER — Ambulatory Visit: Payer: Self-pay

## 2024-03-06 DIAGNOSIS — R310 Gross hematuria: Secondary | ICD-10-CM

## 2024-03-06 MED ORDER — NITROFURANTOIN MONOHYD MACRO 100 MG PO CAPS: 100.0000 mg | ORAL_CAPSULE | Freq: Two times a day (BID) | ORAL | 0 refills | Status: AC

## 2024-03-06 NOTE — Telephone Encounter (Signed)
-----   Message from Garnette HERO Dahlstedt sent at 03/06/2024 11:11 AM EDT ----- Please call patient.  She did have bacteria in your urine.  If she was having some blood, it may well be from the infection.  If she feels like she has an infection, okay to call in Macrobid  1 p.o.  every 12 hours #10, no refills. ----- Message ----- From: Rebecka Memos Lab Results In Sent: 03/01/2024   5:38 AM EDT To: Garnette Shack, MD

## 2024-03-06 NOTE — Telephone Encounter (Signed)
 [name] made aware of urine culture results and voiced understanding.

## 2024-03-07 ENCOUNTER — Inpatient Hospital Stay: Attending: Hematology

## 2024-03-07 DIAGNOSIS — D696 Thrombocytopenia, unspecified: Secondary | ICD-10-CM | POA: Diagnosis not present

## 2024-03-07 DIAGNOSIS — Z806 Family history of leukemia: Secondary | ICD-10-CM | POA: Diagnosis not present

## 2024-03-07 DIAGNOSIS — D509 Iron deficiency anemia, unspecified: Secondary | ICD-10-CM | POA: Insufficient documentation

## 2024-03-07 DIAGNOSIS — E538 Deficiency of other specified B group vitamins: Secondary | ICD-10-CM | POA: Diagnosis not present

## 2024-03-07 DIAGNOSIS — D508 Other iron deficiency anemias: Secondary | ICD-10-CM

## 2024-03-07 DIAGNOSIS — Z800 Family history of malignant neoplasm of digestive organs: Secondary | ICD-10-CM | POA: Diagnosis not present

## 2024-03-07 DIAGNOSIS — Z803 Family history of malignant neoplasm of breast: Secondary | ICD-10-CM | POA: Diagnosis not present

## 2024-03-07 DIAGNOSIS — E559 Vitamin D deficiency, unspecified: Secondary | ICD-10-CM | POA: Insufficient documentation

## 2024-03-07 DIAGNOSIS — D500 Iron deficiency anemia secondary to blood loss (chronic): Secondary | ICD-10-CM

## 2024-03-07 LAB — CBC WITH DIFFERENTIAL/PLATELET
Abs Immature Granulocytes: 0.02 K/uL (ref 0.00–0.07)
Basophils Absolute: 0.1 K/uL (ref 0.0–0.1)
Basophils Relative: 1 %
Eosinophils Absolute: 0.1 K/uL (ref 0.0–0.5)
Eosinophils Relative: 2 %
HCT: 37.2 % (ref 36.0–46.0)
Hemoglobin: 11.6 g/dL — ABNORMAL LOW (ref 12.0–15.0)
Immature Granulocytes: 0 %
Lymphocytes Relative: 27 %
Lymphs Abs: 1.7 K/uL (ref 0.7–4.0)
MCH: 31.4 pg (ref 26.0–34.0)
MCHC: 31.2 g/dL (ref 30.0–36.0)
MCV: 100.5 fL — ABNORMAL HIGH (ref 80.0–100.0)
Monocytes Absolute: 0.5 K/uL (ref 0.1–1.0)
Monocytes Relative: 8 %
Neutro Abs: 3.8 K/uL (ref 1.7–7.7)
Neutrophils Relative %: 62 %
Platelets: 149 K/uL — ABNORMAL LOW (ref 150–400)
RBC: 3.7 MIL/uL — ABNORMAL LOW (ref 3.87–5.11)
RDW: 16 % — ABNORMAL HIGH (ref 11.5–15.5)
WBC: 6.2 K/uL (ref 4.0–10.5)
nRBC: 0 % (ref 0.0–0.2)

## 2024-03-07 LAB — COMPREHENSIVE METABOLIC PANEL WITH GFR
ALT: 17 U/L (ref 0–44)
AST: 19 U/L (ref 15–41)
Albumin: 3.6 g/dL (ref 3.5–5.0)
Alkaline Phosphatase: 108 U/L (ref 38–126)
Anion gap: 9 (ref 5–15)
BUN: 28 mg/dL — ABNORMAL HIGH (ref 8–23)
CO2: 19 mmol/L — ABNORMAL LOW (ref 22–32)
Calcium: 8.9 mg/dL (ref 8.9–10.3)
Chloride: 110 mmol/L (ref 98–111)
Creatinine, Ser: 0.91 mg/dL (ref 0.44–1.00)
GFR, Estimated: >60 mL/min (ref >60)
Glucose, Bld: 143 mg/dL — ABNORMAL HIGH (ref 70–99)
Potassium: 4 mmol/L (ref 3.5–5.1)
Sodium: 138 mmol/L (ref 135–145)
Total Bilirubin: 0.8 mg/dL (ref 0.0–1.2)
Total Protein: 7.4 g/dL (ref 6.5–8.1)

## 2024-03-07 LAB — IRON AND TIBC
Iron: 53 ug/dL (ref 28–170)
Saturation Ratios: 20 % (ref 10.4–31.8)
TIBC: 260 ug/dL (ref 250–450)
UIBC: 207 ug/dL

## 2024-03-07 LAB — FERRITIN: Ferritin: 205 ng/mL (ref 11–307)

## 2024-03-07 LAB — VITAMIN B12: Vitamin B-12: 326 pg/mL (ref 180–914)

## 2024-03-08 LAB — HEMATURIA PROFILE

## 2024-03-11 ENCOUNTER — Telehealth: Payer: Self-pay

## 2024-03-11 NOTE — Telephone Encounter (Signed)
 Copied from CRM [id number]. Topic: Clinical - Home Health Verbal Orders >> Mar 11, 2024 12:49 PM Ivette P wrote: Caller/Agency: [facility]  Callback Number: [phone] - secured line  Service Requested: Skilled Nursing  Wanted to confirm if faxed was received.   Ord number [id number]  New fasting GBG Perimeter added,   Any new concerns about the patient? No

## 2024-03-12 LAB — METHYLMALONIC ACID, SERUM: Methylmalonic Acid, Quantitative: 417 nmol/L — ABNORMAL HIGH (ref 0–378)

## 2024-03-13 ENCOUNTER — Ambulatory Visit (HOSPITAL_COMMUNITY)
Admission: RE | Admit: 2024-03-13 | Discharge: 2024-03-13 | Disposition: A | Discharge status: Home / Self Care | Source: Ambulatory Visit | Attending: Family Medicine | Admitting: Family Medicine

## 2024-03-13 ENCOUNTER — Other Ambulatory Visit: Payer: Self-pay | Admitting: Family Medicine

## 2024-03-13 DIAGNOSIS — J9611 Chronic respiratory failure with hypoxia: Secondary | ICD-10-CM

## 2024-03-13 DIAGNOSIS — Z930 Tracheostomy status: Secondary | ICD-10-CM | POA: Insufficient documentation

## 2024-03-13 DIAGNOSIS — E662 Morbid (severe) obesity with alveolar hypoventilation: Secondary | ICD-10-CM | POA: Diagnosis present

## 2024-03-13 DIAGNOSIS — J386 Stenosis of larynx: Secondary | ICD-10-CM

## 2024-03-13 DIAGNOSIS — G4733 Obstructive sleep apnea (adult) (pediatric): Secondary | ICD-10-CM | POA: Diagnosis not present

## 2024-03-13 DIAGNOSIS — J9612 Chronic respiratory failure with hypercapnia: Secondary | ICD-10-CM

## 2024-03-13 DIAGNOSIS — Z9911 Dependence on respirator [ventilator] status: Secondary | ICD-10-CM

## 2024-03-13 NOTE — Plan of Care (Signed)
    To whom it may concern,   RE: [name] 01-May-1951   Patient will require on-going  wifi access to allow for her blood glucose monitoring.   Thank you in advance.    [name] Banner ACNP-BC [place] [facility] [phone]

## 2024-03-13 NOTE — Progress Notes (Signed)
 Tracheostomy Procedure Note  [name] [id number] [birth year]  Pre Procedure Tracheostomy Information  Trach Brand: Shiley Size: #6 [id number] Style: Cuffed Secured by: Velcro   Procedure: Trach change, cleaning and education.    Post Procedure Tracheostomy Information  Trach Brand: Shiley Size: #6 [id number] Style: Cuffed Secured by: Velcro   Post Procedure Evaluation:  ETCO2 positive color change from yellow to purple : Yes.   Vital signs: stable throughout. Patients current condition: stable Complications: No apparent complications Trach site exam: clean, dry, no drainage Wound care done: dry, saline soaked, clean, and 4 x 4 gauze Patient did tolerate procedure well.   Education: Addressed with Jenna, CCM-NP.  Prescription needs: None    Additional needs: Extra trach, innercannulas and PMV sent home with pt.

## 2024-03-13 NOTE — Progress Notes (Signed)
 Reason for visit Planned trach change  HPI 73 year old pt well known to me. Follow her for trach dependence for OHS,OSA on nocturnal ventilation.  Presents today on     Recent ENT and additional history  -Referred to ENT May 2023 for subglottis stenosis. subglottic mass removed  -direct laryngoscopy and airway evaluation in September 2023.  Large subglottic posteriorly-based granuloma which was eminating superiorly through the glottis, biopsied and send for permanent pathology; thick circumferential subglottic granulations tissue, which was removed with cupped forceps and coblator  Much improved subglottic airway following the procedure F/u 11/8: had been doing better initially post-op.  he visualized subglottis and proximal trachea are patent and the Shiley tracheostomy tube is visible in the subglottis the trachea is edematous and there does not appear to be much room around the tracheostomy device they recommended fenestrated trach which we placed.  This was subsequently discontinued as the patient did not tolerate it. She has surgery scheduled for 1/23 at [facility] w/ plan for Micro laryngectomy, removal of granulation, tracheal dilation and kenalog injection w/ hope that this will allow her to phonate and tolerate PMV again. We have agreed we do not want to work towards decannulation as she has been intol of BIPAP in past. While waiting I see she has had a f/u echo and c/w 2022 her EF has improved from 45-50% to 55-60%, there is mod LVH, RV fxn mildly reduced, she has severe elevation of PAS (88 mmHg).  1/23 s/p DL removal of granulation tissue and tracheal dilation. Tolerated well. Speech improved post-operatively.  2/1 thru 2/4 admitted to [facility] for PNA   Review of Systems  All other systems reviewed and are negative.   Exam General Pleasant 73 year old female patient currently in no acute distress HEENT normocephalic atraumatic she has her cuffed tracheostomy, size 6 it is midline, with a  air deflated she is able to tolerate PMV much better.  Her voice quality continues to improve.  Stoma is clean dry no evidence of infection Pulmonary: Clear to auscultation Cardiac regular rate and rhythm Extremities warm dry Neuro intact  Procedure Tracheostomy was removed, tracheostomy site inspected, new tracheostomy placed over obturator, patient tolerated well, placement verified with end-tidal CO2..  Active Problems:   Tracheostomy status (HCC)   Subglottic stenosis   OSA (obstructive sleep apnea)   Chronic respiratory failure with hypoxia and hypercapnia (HCC)   Dependence on home ventilator (HCC)   Obesity hypoventilation syndrome (HCC)    Tracheostomy status (HCC) Overview:  Trach Brand: Shiley Size:  6.0 [id number] Style: Cuffed Secured by: Velcro  Last change today 9/10  Discussion Doing well.  Gets her tracheostomy changed every 2 weeks.  Nursing staff changes, alternating by changes here in the clinic Trach stable. Not candidate for removal d/t need for nocturnal ventilation and intolerance of CPAP ALSO on-going issues w/ SGS  Plan Continue routine trach care Return office visit 4 weeks for planned trach change Continue nocturnal ventilation            15 minutes [name] Banner ACNP-BC [facility] Pulmonary/Critical Care Pager # [phone] OR # [phone] if no answer

## 2024-03-14 ENCOUNTER — Inpatient Hospital Stay (HOSPITAL_BASED_OUTPATIENT_CLINIC_OR_DEPARTMENT_OTHER): Admitting: Oncology

## 2024-03-14 VITALS — BP 138/68 | HR 56 | Temp 97.1°F | Resp 20 | Wt 151.1 lb

## 2024-03-14 DIAGNOSIS — D696 Thrombocytopenia, unspecified: Secondary | ICD-10-CM | POA: Insufficient documentation

## 2024-03-14 DIAGNOSIS — D509 Iron deficiency anemia, unspecified: Secondary | ICD-10-CM | POA: Diagnosis not present

## 2024-03-14 DIAGNOSIS — E559 Vitamin D deficiency, unspecified: Secondary | ICD-10-CM

## 2024-03-14 NOTE — Assessment & Plan Note (Addendum)
-  Platelet count has been intermittently low over the past year ranging from 110-normal. -Most recent platelet count from 03/07/2024 showed a platelet count of 149. -Had CT abdomen on 04/29/2023 which did not reveal any hepatosplenomegaly.

## 2024-03-14 NOTE — Assessment & Plan Note (Addendum)
-  Received 1 g Monoferric  on 03/30/2023 with good tolerance. -Labs from 03/07/2024 show ferritin of 205, iron  saturation 20% within normal TIBC.  Hemoglobin is 11.6.  Platelets 149.  B12 level 326 with an elevated MMA. -Denies any bleeding per rectum. -She was taken off B12 supplements due to elevated levels.  Recommend she restart every other day. -No additional IV iron  needed at this time. -If hemoglobin and platelets continue to trend down despite adequate iron  and B12 levels, would recommend additional workup with possible bone marrow biopsy. -Follow-up in 4 months with labs a few days before and office visit.

## 2024-03-14 NOTE — Assessment & Plan Note (Signed)
 She is currently taking vitamin D  supplements daily. No recent level checked.  Will recheck at her next lab drawl.

## 2024-03-14 NOTE — Progress Notes (Signed)
 [facility] OFFICE PROGRESS NOTE  Del Wilhelmena Falter, Hilario, FNP  ASSESSMENT & PLAN:    Assessment & Plan Iron  deficiency anemia, unspecified iron  deficiency anemia type -Received 1 g Monoferric  on 03/30/2023 with good tolerance. -Labs from 03/07/2024 show ferritin of 205, iron  saturation 20% within normal TIBC.  Hemoglobin is 11.6.  Platelets 149.  B12 level 326 with an elevated MMA. -Denies any bleeding per rectum. -She was taken off B12 supplements due to elevated levels.  Recommend she restart every other day. -No additional IV iron  needed at this time. -If hemoglobin and platelets continue to trend down despite adequate iron  and B12 levels, would recommend additional workup with possible bone marrow biopsy. -Follow-up in 4 months with labs a few days before and office visit. Thrombocytopenia (HCC) -Platelet count has been intermittently low over the past year ranging from 110-normal. -Most recent platelet count from 03/07/2024 showed a platelet count of 149. -Had CT abdomen on 04/29/2023 which did not reveal any hepatosplenomegaly. Vitamin D  deficiency She is currently taking vitamin D  supplements daily. No recent level checked.  Will recheck at her next lab drawl.  Orders Placed This Encounter  Procedures   CBC with Differential    Standing Status:   Future    Expected Date:   07/14/2024    Expiration Date:   10/12/2024   Comprehensive metabolic panel    Standing Status:   Future    Expected Date:   07/14/2024    Expiration Date:   10/12/2024   Ferritin    Standing Status:   Future    Expected Date:   07/14/2024    Expiration Date:   10/12/2024   Iron  and TIBC ([facility] ONLY)    Standing Status:   Future    Expected Date:   07/14/2024    Expiration Date:   10/12/2024   Vitamin B12    Standing Status:   Future    Expected Date:   07/14/2024    Expiration Date:   10/12/2024   Methylmalonic acid, serum    Standing Status:   Future    Expected  Date:   07/14/2024    Expiration Date:   10/12/2024   Vitamin D  (25 hydroxy)    Standing Status:   Future    Expected Date:   07/14/2024    Expiration Date:   10/12/2024    INTERVAL HISTORY: Patient returns for follow-up for iron  deficiency, B12 deficiency and thrombocytopenia.  She received 1 g of Monoferric  on 03/30/2023.   She denies any recent hospitalizations.  She is here today with her daughter.   Patient has past medical history significant for chronic respiratory failure and supraglottic stenosis.  She has her trach changed monthly and continues to use her ventilator at home as needed.   She continues to hold her B12 supplements due to elevated B12 levels.   Reports overall feeling well today.  Appetite 75% energy levels are 50%.  Denies any pain.  Has occasional constipation but has daily diarrhea.  She takes Imodium  especially if she is going to go out anywhere due to bowel urgency and often times incontinence.  Reports they are leaving for a cruise this weekend. She denies any bright red blood per rectum, melena or hematochezia.    We reviewed CBC, CMP, ferritin, iron  panel, B12 and MMA.  SUMMARY OF HEMATOLOGIC HISTORY: Oncology History   No history exists.   1.  Normocytic anemia: - She had a history of iron   deficiency anemia in the past, status post Injectafer , last given in November 2019. - She had 2 units of blood transfusion during recent hospitalization. - She had tracheotomy done as she cannot tolerate CPAP machine for sleep apnea. - No rectal bleeding.    2.  Social/family history: - She is seen with her daughter Karna who is her caregiver.  Patient walks with the help of walker.  She is able to dress herself.  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  Quit smoking in 1994. - Sister had breast cancer.  Another sister had leukemia.  Maternal uncle had stomach  cancer.  CBC    Component Value Date/Time   WBC 6.2 03/07/2024 1330   RBC 3.70 (L) 03/07/2024 1330   HGB 11.6 (L) 03/07/2024 1330   HGB 11.0 (L) 12/14/2023 1451   HCT 37.2 03/07/2024 1330   HCT 35.3 12/14/2023 1451   PLT 149 (L) 03/07/2024 1330   PLT 151 12/14/2023 1451   MCV 100.5 (H) 03/07/2024 1330   MCV 100 (H) 12/14/2023 1451   MCH 31.4 03/07/2024 1330   MCHC 31.2 03/07/2024 1330   RDW 16.0 (H) 03/07/2024 1330   RDW 14.0 12/14/2023 1451   LYMPHSABS 1.7 03/07/2024 1330   LYMPHSABS 2.0 12/14/2023 1451   MONOABS 0.5 03/07/2024 1330   EOSABS 0.1 03/07/2024 1330   EOSABS 0.0 12/14/2023 1451   BASOSABS 0.1 03/07/2024 1330   BASOSABS 0.1 12/14/2023 1451       Latest Ref Rng & Units 03/07/2024    1:30 PM 12/14/2023    2:51 PM 11/02/2023    1:27 PM  CMP  Glucose 70 - 99 mg/dL 856  805  849   BUN 8 - 23 mg/dL 28  21  20    Creatinine 0.44 - 1.00 mg/dL 9.08  8.88  8.91   Sodium 135 - 145 mmol/L 138  138  140   Potassium 3.5 - 5.1 mmol/L 4.0  4.3  3.5   Chloride 98 - 111 mmol/L 110  107  112   CO2 22 - 32 mmol/L 19  17  18    Calcium  8.9 - 10.3 mg/dL 8.9  9.1  9.1   Total Protein 6.5 - 8.1 g/dL 7.4   7.1   Total Bilirubin 0.0 - 1.2 mg/dL 0.8   0.6   Alkaline Phos 38 - 126 U/L 108   106   AST 15 - 41 U/L 19   14   ALT 0 - 44 U/L 17   11      Lab Results  Component Value Date   FERRITIN 205 03/07/2024   VITAMINB12 326 03/07/2024    Vitals:   03/14/24 1321  BP: 138/68  Pulse: (!) 56  Resp: 20  Temp: (!) 97.1 F (36.2 C)  SpO2: 99%    Review of System:  Review of Systems  Constitutional:  Positive for malaise/fatigue.  Gastrointestinal:  Positive for constipation and diarrhea. Negative for blood in stool.  Musculoskeletal:  Positive for joint pain.    Physical Exam: Physical Exam Constitutional:      Appearance: Normal appearance.  HENT:     Head: Normocephalic and atraumatic.  Eyes:     Pupils: Pupils are equal, round, and reactive to light.  Neck:      Comments: Trach in place Cardiovascular:     Rate and Rhythm: Normal rate and regular rhythm.  Heart sounds: Normal heart sounds. No murmur heard. Pulmonary:     Effort: Pulmonary effort is normal.     Breath sounds: Normal breath sounds. No wheezing.  Abdominal:     General: Bowel sounds are normal. There is no distension.     Palpations: Abdomen is soft.     Tenderness: There is no abdominal tenderness.  Musculoskeletal:        General: Normal range of motion.     Cervical back: Normal range of motion.  Skin:    General: Skin is warm and dry.     Findings: No rash.  Neurological:     Mental Status: She is alert and oriented to person, place, and time.     Gait: Gait is intact.  Psychiatric:        Mood and Affect: Mood and affect normal.        Cognition and Memory: Memory normal.        Judgment: Judgment normal.      I spent 20 minutes dedicated to the care of this patient (face-to-face and non-face-to-face) on the date of the encounter to include what is described in the assessment and plan.,  Delon Hope, NP 03/14/2024 2:18 PM

## 2024-03-20 ENCOUNTER — Telehealth: Payer: Self-pay

## 2024-03-20 NOTE — Telephone Encounter (Signed)
-----   Message from Garnette HERO Dahlstedt sent at 03/20/2024 12:48 PM EDT ----- Please call patient-urine cytology showed no evidence of cancer cells in her urine, good news. ----- Message ----- From: Sammie Exie HERO, CMA Sent: 03/15/2024   9:18 AM EDT To: Garnette Shack, MD  Please review and advise

## 2024-03-21 ENCOUNTER — Ambulatory Visit: Admitting: Family Medicine

## 2024-03-22 NOTE — Telephone Encounter (Signed)
 Faxed back w/confirmation

## 2024-03-25 ENCOUNTER — Telehealth: Payer: Self-pay | Admitting: Family Medicine

## 2024-03-25 NOTE — Telephone Encounter (Unsigned)
 Copied from CRM [id number]. Topic: General - Other >> Mar 22, 2024  5:00 PM Selinda RAMAN wrote: Reason for CRM: Catelyn with [facility] called in stating they received orders on the patient but it was not dated and they have to have a date on it. Please refax with the date on it. She also re faxed the orders in case they were needed. Please assist patient further and if needed call Catelyn at [phone]

## 2024-03-26 ENCOUNTER — Telehealth: Payer: Self-pay

## 2024-03-26 NOTE — Telephone Encounter (Signed)
 Copied from CRM #[id number]. Topic: General - Other >> Mar 26, 2024 12:59 PM Fonda T wrote: Reason for CRM: Received all from Caitlin, with [facility], regarding patient.  Calling to states that a form was faxed to office last week, and received back, but with no date on the form. Per [place] requesting to return form with a date signed indicated on form.  Order # [id number]  Can be reached of need to speak further at [phone].

## 2024-03-28 ENCOUNTER — Other Ambulatory Visit: Payer: Self-pay | Admitting: Family Medicine

## 2024-03-28 ENCOUNTER — Telehealth: Admitting: Family Medicine

## 2024-03-28 DIAGNOSIS — E039 Hypothyroidism, unspecified: Secondary | ICD-10-CM

## 2024-03-28 DIAGNOSIS — Z794 Long term (current) use of insulin: Secondary | ICD-10-CM | POA: Diagnosis not present

## 2024-03-28 DIAGNOSIS — E1169 Type 2 diabetes mellitus with other specified complication: Secondary | ICD-10-CM | POA: Diagnosis not present

## 2024-03-28 MED ORDER — LORAZEPAM 1 MG PO TABS: ORAL_TABLET | ORAL | 0 refills | Status: DC

## 2024-03-28 MED ORDER — GNP PEN NEEDLES 31G X 8 MM MISC: 3 refills | Status: AC

## 2024-03-28 NOTE — Progress Notes (Signed)
 Virtual Visit via Video Note  I connected with [name] on 03/28/24 at  2:40 PM EDT by a video enabled telemedicine application and verified that I am speaking with the correct person using two identifiers.  Patient Location: Home Provider Location: Office/Clinic  I discussed the limitations, risks, security, and privacy concerns of performing an evaluation and management service by video and the availability of in person appointments. I also discussed with the patient that there may be a patient responsible charge related to this service. The patient expressed understanding and agreed to proceed.  Subjective: PCP: Tara Wilhelmena Lloyd Hilario, FNP  No chief complaint on file.  Diabetes She presents for her follow-up diabetic visit. She has type 2 diabetes mellitus. Her disease course has been fluctuating. Pertinent negatives for hypoglycemia include no sweats or tremors. Pertinent negatives for diabetes include no blurred vision, no foot ulcerations and no visual change. Pertinent negatives for hypoglycemia complications include no blackouts. Risk factors for coronary artery disease include diabetes mellitus, hypertension, dyslipidemia and sedentary lifestyle. Current diabetic treatment includes insulin  injections and oral agent (monotherapy). She is compliant with treatment all of the time. She is following a diabetic diet. Meal planning includes avoidance of concentrated sweets. She rarely participates in exercise. Her breakfast blood glucose range is generally 130-140 mg/dl. Her overall blood glucose range is 130-140 mg/dl. An ACE inhibitor/angiotensin II receptor blocker is not being taken. She does not see a podiatrist.Eye exam is current.     ROS: Per HPI  Current Outpatient Medications:    Insulin  Pen Needle (GNP PEN NEEDLES) 31G X 8 MM MISC, Take as directed, Disp: 100 each, Rfl: 3   acetaminophen  (TYLENOL ) 650 MG CR tablet, Take 1,300 mg by mouth 2 (two) times daily., Disp: ,  Rfl:    atorvastatin  (LIPITOR ) 40 MG tablet, Take 1 tablet (40 mg total) by mouth daily., Disp: 90 tablet, Rfl: 3   busPIRone  (BUSPAR ) 30 MG tablet, TAKE ONE TABLET BY MOUTH EVERY MORNING AND ONE TABLET AT BEDTIME., Disp: 60 tablet, Rfl: 3   Cholecalciferol  (VITAMIN D -3 PO), Take 1 tablet by mouth daily in the afternoon., Disp: , Rfl:    Continuous Glucose Sensor (FREESTYLE LIBRE 2 SENSOR) MISC, APPLY ONE SENSOR TO THE SKIN EVERY 14 DAYS AS DIRECTED TO CONTINUOUSLY MONITOR GLUCOSE LEVELS, Disp: 3 each, Rfl: 2   dicyclomine  (BENTYL ) 10 MG capsule, TAKE 1 CAPSULE BY MOUTH UP TO 4 TIMES DAILY BEFORE MEALS AND AT BEDTIME (STOP IF CONSTIPATION)., Disp: 120 capsule, Rfl: 3   donepezil  (ARICEPT ) 5 MG tablet, TAKE 1 TABLET BY MOUTH AT BEDTIME FOR DEMENTIA., Disp: 30 tablet, Rfl: 5   ELIQUIS  5 MG TABS tablet, TAKE 1 TABLET BY MOUTH TWICE DAILY AS A BLOOD THINNER., Disp: 60 tablet, Rfl: 0   furosemide  (LASIX ) 20 MG tablet, Take 1 tablet (20 mg total) by mouth daily., Disp: 100 tablet, Rfl: 3   gabapentin  (NEURONTIN ) 100 MG capsule, TAKE 1 TABLET BY MOUTH THREE TIMES DAILY FOR NEUROPATHIC PAIN., Disp: 90 capsule, Rfl: 5   hydrocortisone  (ANUSOL -HC) 2.5 % rectal cream, Place 1 Application rectally 2 (two) times daily., Disp: 30 g, Rfl: 1   insulin  aspart (NOVOLOG  FLEXPEN) 100 UNIT/ML FlexPen, Inject 0-8 Units into the skin 3 (three) times daily with meals., Disp: 15 mL, Rfl: 4   insulin  glargine (LANTUS  SOLOSTAR) 100 UNIT/ML Solostar Pen, Inject 20 Units into the skin at bedtime., Disp: 15 mL, Rfl: 4   ipratropium (ATROVENT ) 0.03 % nasal spray, Place 2  sprays into both nostrils 3 (three) times daily as needed for rhinitis., Disp: 30 mL, Rfl: 12   JARDIANCE  10 MG TABS tablet, TAKE ONE TABLET BY MOUTH EVERY DAY, Disp: 30 tablet, Rfl: 2   levothyroxine  (SYNTHROID ) 150 MCG tablet, Take 1 tablet (150 mcg total) by mouth daily before breakfast., Disp: 30 tablet, Rfl: 0   LORazepam  (ATIVAN ) 1 MG tablet, Take 1 to 1 1/2  tablets once daily as needed for anxiety or to aid with sleep, Disp: 30 tablet, Rfl: 0   metoprolol  tartrate (LOPRESSOR ) 25 MG tablet, TAKE 1 TABLET BY MOUTH TWICE DAILY FOR HIGH BLOOD PRESSURE OR HEART RATE CONTROL., Disp: 60 tablet, Rfl: 5   nitrofurantoin , macrocrystal-monohydrate, (MACROBID ) 100 MG capsule, Take 1 capsule (100 mg total) by mouth every 12 (twelve) hours., Disp: 10 capsule, Rfl: 0   nitroGLYCERIN  (NITROSTAT ) 0.4 MG SL tablet, Place 0.4 mg under the tongue every 5 (five) minutes as needed for chest pain., Disp: , Rfl:    pantoprazole  (PROTONIX ) 40 MG tablet, TAKE 1 TABLET BY MOUTH TWICE DAILY FOR ACID REFLUX., Disp: 60 tablet, Rfl: 5   potassium chloride  (KLOR-CON  M) 10 MEQ tablet, TAKE 2 TABLETS BY MOUTH DAILY TO REPLACE POTASSIUM., Disp: 60 tablet, Rfl: 2   sertraline  (ZOLOFT ) 50 MG tablet, Take 1 tablet (50 mg total) by mouth daily., Disp: 30 tablet, Rfl: 3   traMADol  (ULTRAM ) 50 MG tablet, Take 1 tablet (50 mg total) by mouth 3 (three) times daily as needed. (Patient taking differently: Take 100 mg by mouth 3 (three) times daily.), Disp: 90 tablet, Rfl: 0   trolamine salicylate (ASPERCREME) 10 % cream, Apply topically., Disp: , Rfl:   Observations/Objective: There were no vitals filed for this visit. Physical Exam Patient is alert and no acute distress noted.   Assessment and Plan: Type 2 diabetes mellitus with other specified complication, with long-term current use of insulin  (HCC) Assessment & Plan: Last Hemoglobin A1c: 8.1 Labs: Ordered today, results pending; will follow up accordingly. The patient reports adhering to prescribed medications: Lantus  20 units at bedtime,on sliding scale insulin  aspart 0-8 units with meals, and Jardiance  10 mg once daily  Reviewed non-pharmacological interventions, including a balanced diet rich in lean proteins, healthy fats, whole grains, and high-fiber vegetables. Emphasized reducing refined sugars and processed carbohydrates, and  incorporating more fruits, leafy greens, and legumes. Education: Patient was educated on recognizing signs and symptoms of both hypoglycemia and hyperglycemia, and advised to seek emergency care if these symptoms occur. Follow-Up: Scheduled for follow-up in 3-4 months, or sooner if needed. Patient Understanding: The patient verbalized understanding of the care plan, and all questions were answered. Additional Care: Ophthalmology exam current  Orders: -     Hemoglobin A1c  Hypothyroidism, unspecified type Assessment & Plan: Continue Synthroid  as prescribed  Order TSH to monitor thyroid  function  Orders: -     TSH + free T4  Other orders -     GNP Pen Needles; Take as directed  Dispense: 100 each; Refill: 3 -     LORazepam ; Take 1 to 1 1/2 tablets once daily as needed for anxiety or to aid with sleep  Dispense: 30 tablet; Refill: 0    Follow Up Instructions: No follow-ups on file.   I discussed the assessment and treatment plan with the patient. The patient was provided an opportunity to ask questions, and all were answered. The patient agreed with the plan and demonstrated an understanding of the instructions.   The patient was advised  to call back or seek an in-person evaluation if the symptoms worsen or if the condition fails to improve as anticipated.  The above assessment and management plan was discussed with the patient. The patient verbalized understanding of and has agreed to the management plan.   [name] Kidd Wilhelmena Falter, FNP

## 2024-03-28 NOTE — Assessment & Plan Note (Signed)
 Continue Synthroid  as prescribed  Order TSH to monitor thyroid  function

## 2024-03-28 NOTE — Assessment & Plan Note (Signed)
 Last Hemoglobin A1c: 8.1 Labs: Ordered today, results pending; will follow up accordingly. The patient reports adhering to prescribed medications: Lantus  20 units at bedtime,on sliding scale insulin  aspart 0-8 units with meals, and Jardiance  10 mg once daily  Reviewed non-pharmacological interventions, including a balanced diet rich in lean proteins, healthy fats, whole grains, and high-fiber vegetables. Emphasized reducing refined sugars and processed carbohydrates, and incorporating more fruits, leafy greens, and legumes. Education: Patient was educated on recognizing signs and symptoms of both hypoglycemia and hyperglycemia, and advised to seek emergency care if these symptoms occur. Follow-Up: Scheduled for follow-up in 3-4 months, or sooner if needed. Patient Understanding: The patient verbalized understanding of the care plan, and all questions were answered. Additional Care: Ophthalmology exam current

## 2024-03-28 NOTE — Telephone Encounter (Signed)
Re faxed to [facility].

## 2024-04-04 ENCOUNTER — Encounter: Payer: Self-pay | Admitting: Internal Medicine

## 2024-04-18 ENCOUNTER — Ambulatory Visit (HOSPITAL_COMMUNITY)
Admission: RE | Admit: 2024-04-18 | Discharge: 2024-04-18 | Disposition: A | Discharge status: Home / Self Care | Source: Ambulatory Visit | Attending: Acute Care | Admitting: Acute Care

## 2024-04-18 ENCOUNTER — Other Ambulatory Visit: Payer: Self-pay | Admitting: Acute Care

## 2024-04-18 DIAGNOSIS — G4733 Obstructive sleep apnea (adult) (pediatric): Secondary | ICD-10-CM | POA: Diagnosis present

## 2024-04-18 DIAGNOSIS — J9611 Chronic respiratory failure with hypoxia: Secondary | ICD-10-CM | POA: Diagnosis present

## 2024-04-18 DIAGNOSIS — J189 Pneumonia, unspecified organism: Secondary | ICD-10-CM | POA: Diagnosis not present

## 2024-04-18 DIAGNOSIS — Z9911 Dependence on respirator [ventilator] status: Secondary | ICD-10-CM

## 2024-04-18 DIAGNOSIS — J386 Stenosis of larynx: Secondary | ICD-10-CM | POA: Diagnosis present

## 2024-04-18 DIAGNOSIS — E662 Morbid (severe) obesity with alveolar hypoventilation: Secondary | ICD-10-CM | POA: Diagnosis present

## 2024-04-18 DIAGNOSIS — Z930 Tracheostomy status: Secondary | ICD-10-CM | POA: Diagnosis present

## 2024-04-18 MED ORDER — PREDNISONE 10 MG PO TABS: ORAL_TABLET | ORAL | 0 refills | Status: AC

## 2024-04-18 MED ORDER — AMOXICILLIN-POT CLAVULANATE 875-125 MG PO TABS: 1.0000 | ORAL_TABLET | Freq: Two times a day (BID) | ORAL | 0 refills | Status: AC

## 2024-04-18 NOTE — Trach Care Team (Signed)
 Tracheostomy Home Health Order Sheet  [name]: [facility] Respiratory Care Department [address]  [place] [address]  [phone]  Fax [phone]  (Fax order requests to this number)   [name]  1951/03/18  [id number]   Please order the following    Cbc, cmp AND tsh   To be drawn via home health nurse and reported to me.   Can send to fax [phone] if results cannot be displayed on [facility]     Maude Banner ACNP NPI# [id number] [facility] Pulmonary Care  [address] [place], [place] [address]     _______________________________________________.

## 2024-04-18 NOTE — Progress Notes (Signed)
 Reason for visit Planned trach change  HPI 73 year old pt well known to me. Follow her for trach dependence for OHS,OSA on nocturnal ventilation.  I see her approximately every 4 weeks for planned trach change.  Had been doing well. Cared for by daughter [name]. Got back from vacation at [place] around 10/6. Developed worsening cough w/ productive sputum. I had given a script for azith should sxs arise when on vacation. She completed azithromycin  on 10/13, initially felt a little better but since that time [name] tells me her mom has been more fatigued, continues to have cough (clear sputum), constant nasal gtt, increased shortness of breath now back on 2 lpm Oxygen , and fairly significant malaise and decreased activity tolerance. She did have sick exposure during her vacation. On my assessment [name] appears uncomfortable, her nose is running constantly and really appears much more fatigued than her usual baseline. She presented today for planned trach change but also for evaluation of increased fatigue and currently named above symptoms      Recent ENT and additional history  -Referred to ENT May 2023 for subglottis stenosis. subglottic mass removed  -direct laryngoscopy and airway evaluation in September 2023.  Large subglottic posteriorly-based granuloma which was eminating superiorly through the glottis, biopsied and send for permanent pathology; thick circumferential subglottic granulations tissue, which was removed with cupped forceps and coblator  Much improved subglottic airway following the procedure F/u 11/8: had been doing better initially post-op.  he visualized subglottis and proximal trachea are patent and the Shiley tracheostomy tube is visible in the subglottis the trachea is edematous and there does not appear to be much room around the tracheostomy device they recommended fenestrated trach which we placed.  This was subsequently discontinued as the patient did not tolerate it. She has  surgery scheduled for 1/23 at [facility] w/ plan for Micro laryngectomy, removal of granulation, tracheal dilation and kenalog injection w/ hope that this will allow her to phonate and tolerate PMV again. We have agreed we do not want to work towards decannulation as she has been intol of BIPAP in past. While waiting I see she has had a f/u echo and c/w 2022 her EF has improved from 45-50% to 55-60%, there is mod LVH, RV fxn mildly reduced, she has severe elevation of PAS (88 mmHg).  1/23 s/p DL removal of granulation tissue and tracheal dilation. Tolerated well. Speech improved post-operatively.  2/1 thru 2/4 admitted to [facility] for PNA   Review of Systems  Constitutional:  Positive for fever, malaise/fatigue and weight loss.  HENT:  Positive for congestion and sore throat.   Eyes: Negative.   Respiratory:  Positive for cough, sputum production and shortness of breath.   Cardiovascular:  Negative for chest pain and leg swelling.  Gastrointestinal: Negative.   Genitourinary: Negative.   Musculoskeletal: Negative.   Skin: Negative.   Endo/Heme/Allergies: Negative.   All other systems reviewed and are negative.   Exam General Pleasant 73 year old female patient currently in no acute distress HEENT normocephalic atraumatic she has her cuffed tracheostomy, size 6 it is midline, with a air deflated she is able to tolerate PMV much better.  Her voice quality continues to improve.  Stoma is clean dry no evidence of infection Pulmonary: Clear to auscultation Cardiac regular rate and rhythm Extremities warm dry Neuro intact  Procedure Tracheostomy was removed, tracheostomy site inspected, new tracheostomy placed over obturator, patient tolerated well, placement verified with end-tidal CO2..  Active Problems:   Subglottic stenosis  OSA (obstructive sleep apnea)   Chronic respiratory failure with hypoxia and hypercapnia (HCC)   Dependence on home ventilator (HCC)   Obesity hypoventilation syndrome  (HCC)    Tracheostomy status (HCC) Overview:  Trach Brand: Shiley Size:  6.0 6CN75R Style: Cuffed Secured by: Velcro  Last change today 10.17  Discussion Doing well.  Gets her tracheostomy changed every 2 weeks.  Nursing staff typically changes, alternating by changes here in the clinic I am going to make an exception and plan to see her in 3 weeks to make sure she is feeling back on track  Trach stable. Not candidate for removal d/t need for nocturnal ventilation and intolerance of CPAP ALSO on-going issues w/ SGS  Plan Continue routine trach care Return office visit 3 weeks for planned trach change Continue nocturnal ventilation    Community acquired pneumonia, unspecified laterality Overview: Clinically + cough, rales, increased O2 needs  w/ sick exposure Did not resolve w/ azithro. Could be either partially treated, or also consider post-viral  Plan Will place her on 7d course augmentin  (no further atypical coverage needed) 8 d taper of pred         My time 26 min included: review of most recent records, direct face to face time obtaining history, performing physical exam, developing and documenting plan as well as discussing this plan with the patient and/or care givers.    [name] Banner ACNP-BC [facility] Pulmonary/Critical Care Pager # [phone] OR # [phone] if no answer

## 2024-04-18 NOTE — Progress Notes (Signed)
 Tracheostomy Procedure Note  [name] [id number] 1951/05/10  Pre Procedure Tracheostomy Information  Lidocaine  neb given prior to trach change  Trach Brand: Shiley Size: 6.0  [id number] Style: Cuffed Secured by: Velcro   Procedure: Trach Change and Trach Cleaning    Post Procedure Tracheostomy Information  Trach Brand: Shiley Size: 6.0  [id number] Style: Cuffed Secured by: Velcro   Post Procedure Evaluation:  ETCO2 positive color change from yellow to purple : Yes.   Vital signs:VSS Patients current condition: stable Complications: No apparent complications Trach site exam: clean, dry Wound care done: 4 x 4 gauze drain Patient did tolerate procedure well.   Education: none  Prescription needs: none    Additional needs: none

## 2024-04-20 LAB — LAB REPORT - SCANNED: EGFR: 53

## 2024-04-22 ENCOUNTER — Other Ambulatory Visit: Payer: Self-pay | Admitting: Family Medicine

## 2024-04-22 ENCOUNTER — Other Ambulatory Visit: Payer: Self-pay | Admitting: Gastroenterology

## 2024-04-22 DIAGNOSIS — K580 Irritable bowel syndrome with diarrhea: Secondary | ICD-10-CM

## 2024-04-29 ENCOUNTER — Other Ambulatory Visit: Payer: Self-pay | Admitting: Family Medicine

## 2024-04-30 ENCOUNTER — Other Ambulatory Visit: Payer: Self-pay | Admitting: Family Medicine

## 2024-05-01 ENCOUNTER — Other Ambulatory Visit: Payer: Self-pay | Admitting: Gastroenterology

## 2024-05-01 DIAGNOSIS — K580 Irritable bowel syndrome with diarrhea: Secondary | ICD-10-CM

## 2024-05-03 ENCOUNTER — Other Ambulatory Visit: Payer: Self-pay | Admitting: Family Medicine

## 2024-05-03 NOTE — Telephone Encounter (Signed)
 Copied from CRM [id number]. Topic: Clinical - Medication Refill >> May 03, 2024 12:48 PM Sophia H wrote: Medication: LORazepam  (ATIVAN ) 1 MG tablet   Has the patient contacted their pharmacy? Yes, pharmacy has been reaching out with no response.   This is the patient's preferred pharmacy:  [facility] [place], [place] - [id number] [place] [address] Phone: [phone] Fax: [phone]  Is this the correct pharmacy for this prescription? Yes If no, delete pharmacy and type the correct one.   Has the prescription been filled recently? Yes  Is the patient out of the medication? Yes, completely out. Pt is needing refill ASAP   Has the patient been seen for an appointment in the last year OR does the patient have an upcoming appointment? Yes, seen back in September  Can we respond through MyChart? Yes  Agent: Please be advised that Rx refills may take up to 3 business days. We ask that you follow-up with your pharmacy.

## 2024-05-06 ENCOUNTER — Encounter: Payer: Self-pay | Admitting: Radiology

## 2024-05-13 ENCOUNTER — Other Ambulatory Visit: Payer: Self-pay | Admitting: Acute Care

## 2024-05-13 ENCOUNTER — Ambulatory Visit (HOSPITAL_COMMUNITY)
Admission: RE | Admit: 2024-05-13 | Discharge: 2024-05-13 | Disposition: A | Discharge status: Home / Self Care | Source: Ambulatory Visit | Attending: Family Medicine | Admitting: Family Medicine

## 2024-05-13 DIAGNOSIS — Z930 Tracheostomy status: Secondary | ICD-10-CM

## 2024-05-13 DIAGNOSIS — J9612 Chronic respiratory failure with hypercapnia: Secondary | ICD-10-CM | POA: Diagnosis not present

## 2024-05-13 DIAGNOSIS — Z9911 Dependence on respirator [ventilator] status: Secondary | ICD-10-CM

## 2024-05-13 DIAGNOSIS — J386 Stenosis of larynx: Secondary | ICD-10-CM | POA: Insufficient documentation

## 2024-05-13 DIAGNOSIS — G4733 Obstructive sleep apnea (adult) (pediatric): Secondary | ICD-10-CM

## 2024-05-13 DIAGNOSIS — Z430 Encounter for attention to tracheostomy: Secondary | ICD-10-CM | POA: Insufficient documentation

## 2024-05-13 DIAGNOSIS — E662 Morbid (severe) obesity with alveolar hypoventilation: Secondary | ICD-10-CM | POA: Insufficient documentation

## 2024-05-13 DIAGNOSIS — J9611 Chronic respiratory failure with hypoxia: Secondary | ICD-10-CM | POA: Insufficient documentation

## 2024-05-13 MED ORDER — ALBUTEROL SULFATE (2.5 MG/3ML) 0.083% IN NEBU: 2.5000 mg | INHALATION_SOLUTION | Freq: Four times a day (QID) | RESPIRATORY_TRACT | 12 refills | Status: AC | PRN

## 2024-05-13 NOTE — Progress Notes (Signed)
 Reason for visit Planned trach change  HPI 73 year old pt well known to me. Follow her for trach dependence for OHS,OSA on nocturnal ventilation.  I see her approximately every 3-4 weeks for planned trach change. Cared for by daughter Karna. I last saw her 10/16 and treated her for CAP that did not resolve w/ azithromycin  (completed 7d rx augmentin  w/ brief pred taper). Cough resolved w/in 24 hrs leading me to think a post viral syndrome may have been culprit. Of note we had to stop her pred taper early as it caused confusion at home. Has beed doing well since until the week of 11/3 at which time she began to report trach site discomfort. Karna also noted intermittent difficulty w/ sxn so the presented today for evaluation of trach discomfort.  Denies new cough, fever, chills, CP, or shortness of breath   Recent ENT and additional history  -Referred to ENT May 2023 for subglottis stenosis. subglottic mass removed  -direct laryngoscopy and airway evaluation in September 2023.  Large subglottic posteriorly-based granuloma which was eminating superiorly through the glottis, biopsied and send for permanent pathology; thick circumferential subglottic granulations tissue, which was removed with cupped forceps and coblator  Much improved subglottic airway following the procedure F/u 11/8: had been doing better initially post-op.  he visualized subglottis and proximal trachea are patent and the Shiley tracheostomy tube is visible in the subglottis the trachea is edematous and there does not appear to be much room around the tracheostomy device they recommended fenestrated trach which we placed.  This was subsequently discontinued as the patient did not tolerate it. She has surgery scheduled for 1/23 at [facility] w/ plan for Micro laryngectomy, removal of granulation, tracheal dilation and kenalog injection w/ hope that this will allow her to phonate and tolerate PMV again. We have agreed we do not want to work  towards decannulation as she has been intol of BIPAP in past. While waiting I see she has had a f/u echo and c/w 2022 her EF has improved from 45-50% to 55-60%, there is mod LVH, RV fxn mildly reduced, she has severe elevation of PAS (88 mmHg).  1/23 s/p DL removal of granulation tissue and tracheal dilation. Tolerated well. Speech improved post-operatively.  2/1 thru 2/4 admitted to [facility] for PNA   Review of Systems  Constitutional:  Negative for fever, malaise/fatigue and weight loss.  HENT:  Positive for sore throat. Negative for congestion.   Eyes: Negative.   Respiratory:  Negative for cough, sputum production and shortness of breath.   Cardiovascular:  Negative for chest pain and leg swelling.  Gastrointestinal: Negative.   Genitourinary: Negative.   Musculoskeletal: Negative.   Skin:  Positive for rash.       Redness around trach stoma but does not appear infectious   Endo/Heme/Allergies: Negative.   All other systems reviewed and are negative.   Exam General 73 year old female presents today in no acute distress HENT NCAT. Jamal was midline. There was mild redness around the trach stoma but pt denied discomfort re: this.  Pulm clear Card rrr Abd soft Ext warm  Neuro intact   Procedure After inhaled lidocaine  FOB was introduced thru the tracheostomy just past the distal end of the trach. The trach was midline. Her distal end of trach was partially occluded by posterior tracheal wall (<25%), but this was completely dependent on head positioning and was completely open and unobstructed in the head neutral position. I did sxn dry mucous off  from the tracheal wall just distal to the tip of trachea. After this the  Tracheostomy was removed, tracheostomy site inspected, new tracheostomy placed over obturator, patient tolerated well, placement verified with end-tidal CO2.SABRA and also re-inspected w/ bronch and confirmed placement   Active Problems:   Tracheostomy status (HCC)    Subglottic stenosis   OSA (obstructive sleep apnea)   Chronic respiratory failure with hypoxia and hypercapnia (HCC)   Dependence on home ventilator (HCC)   Obesity hypoventilation syndrome (HCC)    Tracheostomy status (HCC) Overview:  Trach Brand: Shiley Size:  6.0 6CN75R Style: Cuffed Secured by: Velcro  Last change today 11/10  Discussion I suspect the discomfort from the trach was just because it was time to change it. She had significant dried secretions and discoloration to the trach itself c/w overgrowth of biofilm. Felt better after trach replacement. Not candidate for removal d/t need for nocturnal ventilation and intolerance of CPAP ALSO on-going issues w/ SGS  Plan Continue routine trach care Return office visit 3 weeks for planned trach change Continue nocturnal ventilation        This visit: review of most recent records, direct face to face time obtaining history, performing physical exam, developing and documenting plan as well as discussing this plan with the patient and/or care givers.  My billing time  level 3: 99213  (>20 min)

## 2024-05-13 NOTE — Progress Notes (Signed)
 Tracheostomy Procedure Note  [name] [id number] Oct 04, 1950  Pre Procedure Tracheostomy Information  Trach Brand: Shiley Size: #6.0 6CN75H Style: Cuffed Secured by: Velcro   Procedure: Bedside bronchoscopy, trach change, trach cleaning, and trach education.    Post Procedure Tracheostomy Information  Trach Brand: Shiley Size: #6.0 6CN75H Style: Cuffed Secured by: Velcro   Post Procedure Evaluation:  ETCO2 positive color change from yellow to purple : Yes.   Vital signs: stable throughout. Patients current condition: stable Complications: No apparent complications Trach site exam: clean, dry, no drainage Wound care done: dry, clean, and 4 x 4 gauze Patient did tolerate procedure well.   Education: Given and dicussed with Jenna, CCM-NP  Prescription needs: Discussed with Babcock, CCM-NP.    Additional needs: New PMV given at end of appt.

## 2024-05-28 ENCOUNTER — Other Ambulatory Visit: Payer: Self-pay | Admitting: Family Medicine

## 2024-05-28 MED ORDER — LORAZEPAM 1 MG PO TABS: ORAL_TABLET | ORAL | 0 refills | Status: DC

## 2024-05-28 NOTE — Telephone Encounter (Signed)
 Copied from CRM [id number]. Topic: Clinical - Medication Refill >> May 28, 2024 11:44 AM Everette C wrote: Medication: LORazepam  (ATIVAN ) 1 MG tablet [[id number]]  Has the patient contacted their pharmacy? No (Agent: If no, request that the patient contact the pharmacy for the refill. If patient does not wish to contact the pharmacy document the reason why and proceed with request.) (Agent: If yes, when and what did the pharmacy advise?)  This is the patient's preferred pharmacy:  [facility] [place], [place] - [id number] [place] [address] Phone: [phone] Fax: [phone]  Is this the correct pharmacy for this prescription? Yes If no, delete pharmacy and type the correct one.   Has the prescription been filled recently? Yes  Is the patient out of the medication? No  Has the patient been seen for an appointment in the last year OR does the patient have an upcoming appointment? Yes  Can we respond through MyChart? No  Agent: Please be advised that Rx refills may take up to 3 business days. We ask that you follow-up with your pharmacy.

## 2024-05-31 ENCOUNTER — Other Ambulatory Visit: Payer: Self-pay | Admitting: Family Medicine

## 2024-05-31 ENCOUNTER — Other Ambulatory Visit: Payer: Self-pay | Admitting: Gastroenterology

## 2024-05-31 DIAGNOSIS — K580 Irritable bowel syndrome with diarrhea: Secondary | ICD-10-CM

## 2024-06-03 ENCOUNTER — Encounter: Payer: Self-pay | Admitting: Gastroenterology

## 2024-06-03 ENCOUNTER — Ambulatory Visit: Admitting: Gastroenterology

## 2024-06-03 VITALS — BP 121/59 | HR 67 | Temp 97.7°F | Ht 66.0 in | Wt 156.8 lb

## 2024-06-03 DIAGNOSIS — K219 Gastro-esophageal reflux disease without esophagitis: Secondary | ICD-10-CM

## 2024-06-03 DIAGNOSIS — R1030 Lower abdominal pain, unspecified: Secondary | ICD-10-CM | POA: Diagnosis not present

## 2024-06-03 DIAGNOSIS — R197 Diarrhea, unspecified: Secondary | ICD-10-CM

## 2024-06-03 DIAGNOSIS — K5900 Constipation, unspecified: Secondary | ICD-10-CM

## 2024-06-03 DIAGNOSIS — F32A Depression, unspecified: Secondary | ICD-10-CM | POA: Diagnosis not present

## 2024-06-03 NOTE — Progress Notes (Unsigned)
 GI Office Note    Referring Provider: Del Orbe Polanco, Ilian* Primary Care Physician:  [name], FNP Primary Gastroenterologist: Carlin POUR. Cindie, DO  Date:  06/04/2024  ID:  [name], DOB 17-Jun-1951, MRN [id number]  Chief Complaint   Chief Complaint  Patient presents with   Diarrhea    Has diarrhea all the time    History of Present Illness  [name] is a 73 y.o. female with a history of CHF, pulmonary hypertension, atrial fibrillation on Eliquis , restrictive lung disease, hospitalized in June 2022 with respiratory failure and required permanent tracheostomy, IDA previously following with hematology and receiving IV iron  due to oral iron  intolerance, chronic lower abdominal pain worsened postprandially with negative mesenteric US  in 2021, gastroparesis, GERD, IBS with alternating constipation and diarrhea,  colitis in October 2024 with no follow-up colonoscopy per patient preference due to high risk presenting today with complaint of ongoing diarrhea.  CT A/P with contrast 04/29/2023 showed long segment colonic wall thickening involving the ascending and proximal transverse colon as well as distal sigmoid colon and rectum consistent with nonspecific colitis.  C. difficile and GI pathogen panel were negative.  No reports of syncope though she did have soft blood pressures on admission.  She was treated with ceftriaxone  and Flagyl  with improvement in abdominal pain and diarrhea.  Recommended holding off on endoscopic evaluation unless worsening diarrhea or rectal bleeding.  Recommended checking stool studies, strict lactose-free diet, continue other medications.  If stool testing negative and diarrhea continues, consider course of Xifaxan .   OV May 2025 where she reported diarrhea mainly occurring in the morning.  On average, 3 BMs per day, but could have 5 or 6 in the 36-hour period.  Stools were very loose/mushy and sometimes uncontrollable.  Yogurt increased stool  frequency.  Ice cream caused lower abdominal pain.  She was taking 3 Benefiber capsules daily, dicyclomine  4 times daily, 1-2 Imodium  about 4 times a week.  Noted she had been on antibiotics in February for community-acquired pneumonia and hospitalized at that time as well.  She was also on Keflex  for her throat.  Zoloft  had also been increased.   Stool studies and labs not completed after May OV.   CT scan June 2025 for the workup of hematuria which revealed moderate stool burden throughout the colon and rectum.  Last office visit 02/29/2024 with Josette Centers PA-C.  She reported diarrhea since a week prior and having blowouts.  Has certain urge to go and then can get to the bathroom in time.  Fluctuates every couple weeks between this and when she close normal bowel movements.  Takes more Imodium  when she has blowouts.  At baseline takes 3 Imodium  per week.  Passes on stool when she has these bowel movements, otherwise has 1-2 bowel moods per day and varies from formed to loose and is in small amounts and usually incomplete.  Little blood on toilet tissue.  Having occasional rectal itching and first noticed this in June around the same time was having hematuria.  Rectal bleeding tends to recur the days that she has diarrhea.  Taking dicyclomine  4 times daily.  KUB was ordered and also rectal cream twice daily for 5-10 days for rectal bleeding.  After x-ray results if stool burden confirmed the recommend stopping Imodium  completely mini MiraLAX  bowel prep and continue Benefiber daily and monitor for bowel function.  Prior endoscopic evaluation:  - EGD in May 2016 due to anemia and history of  Barrett's without evidence of Barrett's. - Last colonoscopy in May 2015 with redundant colon, moderate diverticulosis throughout the entire colon.  - Capsule in April 2015 with idiopathic small bowel ulcers that have been present since 2013. Had been taking Aleve.   KUB August 2025 without any abnormal bowel  dilatation but with mild stool burden throughout the colon.  She was advised to stop using Imodium  complete the many MiraLAX  bowel cleanse and to call with an update in the next week as they advised that the goal is to have her have a good productive bowel movement on a daily basis after completing the MiraLAX  bowel prep.  Today:  Discussed the use of AI scribe software for clinical note transcription with the patient, who gave verbal consent to proceed.  She experiences ongoing bowel irregularities characterized by alternating constipation and diarrhea, with a sensation of incomplete evacuation necessitating multiple trips to the bathroom. This issue has been persistent, impacting daily activities and causing anxiety about leaving the house due to the unpredictability of her bowel movements as when this occurs it is often looser in nature.   She has attempted a bowel prep regimen, including Miralax  and stool softeners, but these treatments have exacerbated her diarrhea. She took miralax  previously but did not complete the mini bowel cleanse at once as recommended. She was taking two doses per day for three days, and a dose of stool softener, but had to pause due to a vacation. Upon resuming Miralax , she experienced worsening diarrhea.  Her caregiver (daughter) notes that she has to plan activities around her bowel habits, often scheduling outings later in the day to allow time for her to manage her bowel movements in the morning. She has also resorted to using diapers during outings due to the urgency and unpredictability of her bowel movements.  She has a history of using Imodium  to manage diarrhea, which provides some emotional relief but does not resolve the underlying issue. She also reports a history of gastroparesis, which affects her dietary choices, as raw vegetables can exacerbate her symptoms.  Family history includes a sister with IBS and cancer, who has experienced similar bowel issues.  She has a history of urinary tract issues, including a recent cystoscopy to rule out cancer, which showed no significant findings. She experienced some blood in her urine recently, which has since resolved. No recent significant weight loss.      Wt Readings from Last 6 Encounters:  06/03/24 156 lb 12.8 oz (71.1 kg)  03/14/24 151 lb 1.6 oz (68.5 kg)  02/29/24 154 lb 12.8 oz (70.2 kg)  12/14/23 141 lb (64 kg)  12/11/23 155 lb 3.2 oz (70.4 kg)  11/09/23 153 lb 10.6 oz (69.7 kg)    Body mass index is 25.31 kg/m.   Current Outpatient Medications  Medication Sig Dispense Refill   acetaminophen  (TYLENOL ) 650 MG CR tablet Take 1,300 mg by mouth 2 (two) times daily.     albuterol  (PROVENTIL ) (2.5 MG/3ML) 0.083% nebulizer solution Take 3 mLs (2.5 mg total) by nebulization every 6 (six) hours as needed for wheezing or shortness of breath. 75 mL 12   busPIRone  (BUSPAR ) 30 MG tablet TAKE ONE TABLET BY MOUTH EVERY MORNING AND ONE TABLET AT BEDTIME. 60 tablet 3   cephALEXin  (KEFLEX ) 500 MG capsule Take 500 mg by mouth 3 (three) times daily.     Cholecalciferol  (VITAMIN D -3 PO) Take 1 tablet by mouth daily in the afternoon.     Continuous Glucose Sensor (FREESTYLE [place]  2 SENSOR) MISC APPLY ONE SENSOR TO THE SKIN EVERY 14 DAYS AS DIRECTED TO CONTINUOUSLY MONITOR GLUCOSE LEVELS 3 each 2   dicyclomine  (BENTYL ) 10 MG capsule TAKE 1 CAPSULE BY MOUTH UP TO 4 TIMES DAILY BEFORE MEALS AND AT BEDTIME (STOP IF CONSTIPATION). 120 capsule 3   donepezil  (ARICEPT ) 5 MG tablet TAKE 1 TABLET BY MOUTH AT BEDTIME FOR DEMENTIA. 30 tablet 5   ELIQUIS  5 MG TABS tablet TAKE 1 TABLET BY MOUTH TWICE DAILY AS A BLOOD THINNER. 60 tablet 0   furosemide  (LASIX ) 20 MG tablet Take 1 tablet (20 mg total) by mouth daily. (Patient taking differently: Take 20 mg by mouth daily. PRN) 100 tablet 3   gabapentin  (NEURONTIN ) 100 MG capsule TAKE 1 TABLET BY MOUTH THREE TIMES DAILY FOR NEUROPATHIC PAIN. 90 capsule 5    HYDROcodone -acetaminophen  (NORCO/VICODIN) 5-325 MG tablet Take 1 tablet by mouth every 4 (four) hours.     hydrocortisone  (ANUSOL -HC) 2.5 % rectal cream Place 1 Application rectally 2 (two) times daily. 30 g 1   insulin  aspart (NOVOLOG  FLEXPEN) 100 UNIT/ML FlexPen Inject 0-8 Units into the skin 3 (three) times daily with meals. 15 mL 4   insulin  glargine (LANTUS  SOLOSTAR) 100 UNIT/ML Solostar Pen Inject 20 Units into the skin at bedtime. 15 mL 4   Insulin  Pen Needle (GNP PEN NEEDLES) 31G X 8 MM MISC Take as directed 100 each 3   ipratropium (ATROVENT ) 0.03 % nasal spray Place 2 sprays into both nostrils 3 (three) times daily as needed for rhinitis. 30 mL 12   JARDIANCE  10 MG TABS tablet TAKE ONE TABLET BY MOUTH EVERY DAY 30 tablet 2   levETIRAcetam  (KEPPRA ) 500 MG tablet Take 500 mg by mouth 2 (two) times daily.     levothyroxine  (SYNTHROID ) 150 MCG tablet Take 1 tablet (150 mcg total) by mouth daily before breakfast. 30 tablet 0   LORazepam  (ATIVAN ) 1 MG tablet Take 1 to 1&1/2 tablets once daily as needed for anxiety or to aid with sleep 30 tablet 0   metoprolol  tartrate (LOPRESSOR ) 25 MG tablet TAKE 1 TABLET BY MOUTH TWICE DAILY FOR HIGH BLOOD PRESSURE OR HEART RATE CONTROL. 60 tablet 5   nitrofurantoin , macrocrystal-monohydrate, (MACROBID ) 100 MG capsule Take 1 capsule (100 mg total) by mouth every 12 (twelve) hours. 10 capsule 0   nitroGLYCERIN  (NITROSTAT ) 0.4 MG SL tablet Place 0.4 mg under the tongue every 5 (five) minutes as needed for chest pain.     pantoprazole  (PROTONIX ) 40 MG tablet TAKE 1 TABLET BY MOUTH TWICE DAILY FOR ACID REFLUX. 60 tablet 5   potassium chloride  (KLOR-CON  M) 10 MEQ tablet TAKE 2 TABLETS BY MOUTH DAILY TO REPLACE POTASSIUM. 60 tablet 2   sertraline  (ZOLOFT ) 50 MG tablet Take 1 tablet (50 mg total) by mouth daily. 30 tablet 3   traMADol  (ULTRAM ) 50 MG tablet Take 1 tablet (50 mg total) by mouth 3 (three) times daily as needed. 90 tablet 0   trolamine salicylate  (ASPERCREME) 10 % cream Apply topically.     amoxicillin -clavulanate (AUGMENTIN ) 875-125 MG tablet Take 1 tablet by mouth 2 (two) times daily. (Patient not taking: Reported on 06/03/2024) 14 tablet 0   atorvastatin  (LIPITOR ) 40 MG tablet Take 1 tablet (40 mg total) by mouth daily. 90 tablet 3   No current facility-administered medications for this visit.    Past Medical History:  Diagnosis Date   Abnormal pulmonary function test    Anemia    H/H of 10/30 with a  normal MCV in 12/09   Anxiety    Arthritis    Atrial fibrillation (HCC)    Barrett's esophagus    Diagnosed 1995. Last EGD 2016-NO BARRETT'S.    Chest pain    a. 2022 Cath: nl cors; b. 2008 Neg myoview; c. 01/2014 Cath: Nl cors. EF 40%; c. 08/2020 NSTEMI/Cath [facility] nl cors.   Chronic anticoagulation    Chronic combined systolic and diastolic CHF (congestive heart failure) (HCC)    a. 10/2014 Echo: EF40%; b. 01/2015 Echo: EF 55-60%; c. 11/2020 Echo: EF 55-60%, mod LVH, sev BAE, mild MR, mod TR. PASP ; d. 01/2021 Echo: EF 45-50%, glob HK, mod conc LVH, mod reduced RV fxn, RVSP 60.7mmHg, Mod TR, mild to mod MR, sev dil LA.   Chronic LBP    Surgical intervention in 1996   Diabetes mellitus, type 2 (HCC)    Insulin  therapy; exacerbated by prednisone    Gastroparesis    99% retention 05/2008 on GES   GERD (gastroesophageal reflux disease)    Heart attack (HCC) 08/13/2020   Hiatal hernia    Hyperlipidemia    Hypertension    Hypothyroid    IBS (irritable bowel syndrome)    Obesity    OSA on CPAP    had CPAP and cannot tolerate.   Paroxysmal atrial fibrillation (HCC)    a.CHA2DS2VASc = 6-->eliquis . Also on Amio.   Pulmonary hypertension (HCC) 01/2015   a. 01/2015 RHC: Predominantly pulmonary venous hypertension but may be component of PAH (PA mean 52, PCWP 31); b. 01/2021 Echo: RVSP 60.22mmHg w/ mod reduced RV fxn.   Seizures (HCC)    last seizure was 2 years ago; on keppra  for this; unknown etiology   Syncope    a. Admitted  05/2009; magnetic resonance imagin/ MRA - negative; etiology thought to be orthostasis secondary to drugs and dehydration. b. Syncope 02/2015 also felt 2/2 dehydration.    Past Surgical History:  Procedure Laterality Date   BACK SURGERY  1996   BIOPSY N/A 11/08/2013   Procedure: BIOPSY  / Tissue sampling / ulcers present in small intestine;  Surgeon: Margo LITTIE Haddock, MD;  [facility];  Service: Endoscopy;  Laterality: N/A;   CARDIAC CATHETERIZATION  2002   CARDIAC CATHETERIZATION N/A 01/26/2015   Procedure: Right Heart Cath;  Surgeon: Ezra GORMAN Shuck, MD;  Location: [facility] INVASIVE CV LAB;  Service: Cardiovascular;  Laterality: N/A;   CARDIOVASCULAR STRESS TEST  2008   Stress nuclear study   CARDIOVERSION N/A 03/06/2015   Procedure: CARDIOVERSION;  Surgeon: Ezra GORMAN Shuck, MD;  Location: [facility] ENDOSCOPY;  Service: Cardiovascular;  Laterality: N/A;   CARPAL TUNNEL RELEASE  1994   CATARACT EXTRACTION W/PHACO Right 01/10/2022   Procedure: CATARACT EXTRACTION PHACO AND INTRAOCULAR LENS PLACEMENT (IOC);  Surgeon: Harrie Agent, MD;  Location: [facility] ORS;  Service: Ophthalmology;  Laterality: Right;  CDE: 21.51   CATARACT EXTRACTION W/PHACO Left 01/24/2022   Procedure: CATARACT EXTRACTION PHACO AND INTRAOCULAR LENS PLACEMENT (IOC);  Surgeon: Harrie Agent, MD;  Location: [facility] OR[facility];  Service: Ophthalmology;  Laterality: Left;  CDE: 17.13   COLONOSCOPY  11/2011   Dr. haddock: Internal hemorrhoids, mild diverticulosis. Random colon biopsies negative.   COLONOSCOPY N/A 11/08/2013   SLF: Normal mucosa in the terminal ileum/The colon IS redundant/  Moderate diverticulosis throughout the entire colon. ileum bx benign. colon bx benign   CRYOTHERAPY  01/23/2021   Procedure: CRYOTHERAPY;  Surgeon: Claudene Toribio BROCKS, MD;  Location: [facility] ENDOSCOPY;  Service: Pulmonary;;   CRYOTHERAPY  01/24/2021   Procedure: CRYOTHERAPY;  Surgeon: Claudene Toribio BROCKS, MD;  Location: [facility] ENDOSCOPY;  Service: Pulmonary;;   CRYOTHERAPY   01/26/2021   Procedure: CHANA;  Surgeon: Alaine Vicenta NOVAK, MD;  Location: [facility] ENDOSCOPY;  Service: Cardiopulmonary;;   ESOPHAGOGASTRODUODENOSCOPY  2008   Barrett's without dysplasia. esphagus dilated. antral erosions, h.pylori serologies negative.   ESOPHAGOGASTRODUODENOSCOPY  11/2011   Dr. Harvey: Barrett's esophagus, mild gastritis, diverticulum in the second portion of the duodenum repeat EGD 3 years. Small bowel biopsies negative. Gastric biopsy show reactive gastropathy but no H. pylori. Esophageal biopsies consistent with GERD. Next EGD 11/2014   ESOPHAGOGASTRODUODENOSCOPY N/A 11/21/2014   DOQ:fpoi non-erosive gastritis/irregular z-line   ESOPHAGOGASTRODUODENOSCOPY  03/2022   FOREIGN BODY REMOVAL  01/23/2021   Procedure: FOREIGN BODY REMOVAL;  Surgeon: Claudene Toribio BROCKS, MD;  Location: [facility] ENDOSCOPY;  Service: Pulmonary;;   FOREIGN BODY REMOVAL  01/24/2021   Procedure: FOREIGN BODY REMOVAL;  Surgeon: Claudene Toribio BROCKS, MD;  Location: [facility] ENDOSCOPY;  Service: Pulmonary;;   GIVENS CAPSULE STUDY  12/07/2011   Proximal small bowel, rare AVM. Distal small bowel, multiple ulcers noted   GIVENS CAPSULE STUDY N/A 09/27/2013   Distal small bowel ulcers extending to TI.   GIVENS CAPSULE STUDY N/A 10/10/2013   Procedure: GIVENS CAPSULE STUDY;  Surgeon: Margo LITTIE Harvey, MD;  Location: [facility];  Service: Endoscopy;  Laterality: N/A;  7:30   HEMOSTASIS CONTROL  01/24/2021   Procedure: HEMOSTASIS CONTROL;  Surgeon: Claudene Toribio BROCKS, MD;  Location: [facility] ENDOSCOPY;  Service: Pulmonary;;   HEMOSTASIS CONTROL  01/26/2021   Procedure: HEMOSTASIS CONTROL;  Surgeon: Alaine Vicenta NOVAK, MD;  Location: [facility] ENDOSCOPY;  Service: Cardiopulmonary;;   IR GASTROSTOMY TUBE MOD SED  01/11/2021   IR GASTROSTOMY TUBE REMOVAL  04/13/2021   IR KYPHO THORACIC WITH BONE BIOPSY  02/09/2018   KNEE ARTHROSCOPY WITH MEDIAL MENISECTOMY Right 06/09/2016   Procedure: KNEE ARTHROSCOPY WITH MEDIAL MENISECTOMY;  Surgeon: Taft FORBES Minerva, MD;  Location: [facility];  Service: Orthopedics;  Laterality: Right;  medial and lateral menisectomy   LAMINECTOMY  1995   L4-L5   LAPAROSCOPIC CHOLECYSTECTOMY  1990s   LEFT HEART CATHETERIZATION WITH CORONARY ANGIOGRAM  01/10/2014   Procedure: LEFT HEART CATHETERIZATION WITH CORONARY ANGIOGRAM;  Surgeon: Victory LELON Claudene DOUGLAS, MD;  Location: [facility] CATH LAB;  Service: Cardiovascular;;   MICROLARYNGOSCOPY WITH LASER AND BALLOON DILATION N/A 11/17/2021   Procedure: MICROLARYNGOSCOPY WITH CO2 LASER, SUBGLOTTIC LARYNGEAL MASS BIOSPY;  Surgeon: Jesus Oliphant, MD;  Location: [facility] OR;  Service: ENT;  Laterality: N/A;   RIGHT HEART CATHETERIZATION N/A 01/10/2014   Procedure: RIGHT HEART CATH;  Surgeon: Victory LELON Claudene DOUGLAS, MD;  Location: [facility] CATH LAB;  Service: Cardiovascular;  Laterality: N/A;   TOTAL ABDOMINAL HYSTERECTOMY  1999   TRACHEOSTOMY REVISION N/A 07/18/2021   Procedure: TRACHEOSTOMY REVISION;  Surgeon: Roark Rush, MD;  Location: [facility] OR;  Service: ENT;  Laterality: N/A;   TRACHEOSTOMY TUBE PLACEMENT N/A 09/01/2017   Procedure: TRACHEOSTOMY;  Surgeon: Ethyl Lonni FORBES, MD;  Location: [facility] OR;  Service: ENT;  Laterality: N/A;   VIDEO BRONCHOSCOPY Bilateral 01/23/2021   Procedure: VIDEO BRONCHOSCOPY WITHOUT FLUORO;  Surgeon: Claudene Toribio BROCKS, MD;  Location: [facility] ENDOSCOPY;  Service: Pulmonary;  Laterality: Bilateral;   VIDEO BRONCHOSCOPY Bilateral 01/24/2021   Procedure: VIDEO BRONCHOSCOPY WITHOUT FLUORO;  Surgeon: Claudene Toribio BROCKS, MD;  Location: [facility] ENDOSCOPY;  Service: Pulmonary;  Laterality: Bilateral;   VIDEO BRONCHOSCOPY N/A 01/26/2021   Procedure: VIDEO BRONCHOSCOPY WITHOUT FLUORO;  Surgeon: Alaine Vicenta NOVAK, MD;  Location: [facility] ENDOSCOPY;  Service: Cardiopulmonary;  Laterality: N/A;    Family History  Problem Relation Age of Onset   Hypertension Mother    Alzheimer's disease Mother    Stroke Mother    Heart attack Mother    Breast cancer Sister    Hypertension Other    Heart disease  Neg Hx    Colon cancer Neg Hx    Liver disease Neg Hx     Allergies as of 06/03/2024 - Review Complete 06/03/2024  Allergen Reaction Noted   Codeine Nausea And Vomiting and Other (See Comments) 11/28/2019   Dilaudid  [hydromorphone  hcl] Other (See Comments) 12/31/2015   Reglan  [metoclopramide ] Other (See Comments) 06/04/2014   Fentanyl   11/08/2023   Levsin  [hyoscyamine ] Diarrhea 09/05/2019    Social History   Socioeconomic History   Marital status: Married    Spouse name: Not on file   Number of children: Not on file   Years of education: Not on file   Highest education level: Not on file  Occupational History   Occupation: Passenger Transport Manager at [employer]    Employer: [employer]    Comment: disabled    Employer: RETIRED  Tobacco Use   Smoking status: Former    Current packs/day: 0.00    Average packs/day: 0.3 packs/day for 27.3 years (6.8 ttl pk-yrs)    Types: Cigarettes    Start date: 02/26/1966    Quit date: 06/30/1993    Years since quitting: 30.9   Smokeless tobacco: Never   Tobacco comments:    quit in 1984  Vaping Use   Vaping status: Never Used  Substance and Sexual Activity   Alcohol use: Not Currently    Comment: Occasional alcohol 30 years ago (09/02/21)   Drug use: No   Sexual activity: Yes    Birth control/protection: None, Surgical  Other Topics Concern   Not on file  Social History Narrative   Sedentary   4 children, blended family   Social Drivers of Corporate Investment Banker Strain: Not on file  Food Insecurity: No Food Insecurity (08/06/2023)   Hunger Vital Sign    Worried About Running Out of Food in the Last Year: Never true    Ran Out of Food in the Last Year: Never true  Transportation Needs: No Transportation Needs (08/06/2023)   PRAPARE - Administrator, Civil Service (Medical): No    Lack of Transportation (Non-Medical): No  Physical Activity: Not on file  Stress: Not on file  Social Connections: Not on file    Review of Systems    Gen: Denies fever, chills, anorexia. Denies fatigue, weakness, weight loss.  CV: Denies chest pain, palpitations, syncope, peripheral edema, and claudication. Resp: + dyspnea. Denies cough, wheezing, coughing up blood, and pleurisy. GI: See HPI Derm: Denies rash, itching, dry skin Psych: + anxiety. Denies depression, memory loss, confusion. No homicidal or suicidal ideation.  Heme: Denies bruising, bleeding, and enlarged lymph nodes.  Physical Exam   BP (!) 121/59 (BP Location: Right Arm, Patient Position: Sitting, Cuff Size: Normal)   Pulse 67   Temp 97.7 F (36.5 C) (Temporal)   Ht 5' 6 (1.676 m)   Wt 156 lb 12.8 oz (71.1 kg)   SpO2 97%   BMI 25.31 kg/m   General:   Alert and oriented. No distress noted. Pleasant and cooperative.  Head:  Normocephalic and atraumatic. Eyes:  Conjuctiva clear without scleral icterus. Mouth:  Oral mucosa pink and  moist. Good dentition. No lesions. Lungs:  tracheostomy in place and oxygen  via nasal cannula.  Abdomen:  +BS, soft, non-distended. Mild ttp to mid lower abdomen. No rebound or guarding. No HSM or masses noted. Rectal: deferred Msk:  Symmetrical without gross deformities. Normal posture. Extremities:  Without edema. Neurologic:  Alert and  oriented x4 Psych:  Alert and cooperative. Anxious.  Assessment & Plan  DAI APEL is a 73 y.o. female presenting today with reports of ongoing diarrhea and urgency.      Constipation with overflow diarrhea and incomplete evacuation of stool, urgency, and incontinence Chronic constipation with overflow diarrhea, likely due to stool burden causing colonic irritability and urgency. Previous bowel prep and Miralax  use exacerbated diarrhea. CT scan and follow-up x-ray showed significant stool burden throughout the colon. Differential includes constipation with overflow, lack of digestive enzymes, liver or gallbladder issues, or infection. Capsule study not indicated as it primarily assesses the  small bowel and is not suitable for diagnosing infection or microscopic issues. - Perform bowel cleanse with Miralax : see AVS for specific instructions.  - Maintain with fiber supplementation post-cleanse. - If no bowel movement within 24 hours post-cleanse, add Miralax  once daily. - If diarrhea persists post-cleanse, will conduct stool studies to evaluate for EPI, bile acid diarrhea, or infection. - Will consider prescription agents like Linzess if bowel movements remain irregular post-cleanse.  Lower abdominal pain Likely related to constipation and stool burden. Pain may also be associated with chronic inflammation from previous urinary tract issues. - Address constipation with bowel cleanse and fiber supplementation as outlined above.  Depression related to bowel dysfunction Depression and anxiety exacerbated by bowel dysfunction, including urgency and incontinence, impacting quality of life and daily activities. - Address bowel dysfunction to improve quality of life and reduce emotional distress.      Follow up   Follow up 6-8 weeks.     Charmaine Melia, MSN, FNP-BC, AGACNP-BC [facility]

## 2024-06-03 NOTE — Patient Instructions (Addendum)
 MIRALAX  PREP INSTRUCTIONS  Purchase:  MIRALAX  238 gram bottle, 1 box of DULCOLAX (All over the counter medications)  START CLEAR LIQUID DIET DAY DOING THE MINI MIRALAX  PREP  At 10:00 AM, take 2 DULCOLAX 5mg  tablets  At 12:00 PM, Mix 5 teaspoons of Miralax  in any 4-6 ounces of CLEAR LIQUIDS (Gatorade) every hour for 5 hours until passing clear, watery stools. Be sure to drink 4 ounces of clear liquid (ANYTHING SEE THROUGH - COLOR DOES NOT MATTER SINCE NOT FOING COLONOSCOPY) 30 minutes after each dose of Miralax . CAN BE SPRITE, WATER , LEMONADE, COFFEE (BLACK), GATORADE OR ANY FLAVOR, JUICE (APPLE OR CRANBERRY)  At 3:00 PM, take 2 Dulcolax 5mg  tablets  If stools are not clear & watery by 6:00 PM, take 5 teaspoons of Miralax  every 30 minutes until stools are clear (no color - or yellow is okay)   You must have a complete prep to ensure the most effective cleaning.  After you notice you are cleaned out you may resume a normal diet and 24 hour after you may resume your fiber intake.   Monitor for bowel habits the next 24 hours to 1 week. If the next day you have multiple episodes of diarrhea and this continues then let me know so we can do stool studies and see what the cause of diarrhea is.   If you have more than 24 hours without a bowel movement you will need to add 1 capful of miralax  to 8 ounces of water  and drink 1-2 times daily. If this does not help with having a BM then let me know and we can do samples of a prescription agent for constipation.   Continue the pantoprazole  and continue to leave off the pepcid  if not needed.   Follow up in 6-8 weeks in the office.   It was a pleasure to see you today. I want to create trusting relationships with patients. If you receive a survey regarding your visit,  I greatly appreciate you taking time to fill this out on paper or through your MyChart. I value your feedback.  Charmaine Melia, MSN, FNP-BC, AGACNP-BC [facility]

## 2024-06-05 ENCOUNTER — Other Ambulatory Visit: Payer: Self-pay | Admitting: Gastroenterology

## 2024-06-05 DIAGNOSIS — K580 Irritable bowel syndrome with diarrhea: Secondary | ICD-10-CM

## 2024-06-21 ENCOUNTER — Telehealth: Payer: Self-pay | Admitting: Family Medicine

## 2024-06-21 ENCOUNTER — Other Ambulatory Visit: Payer: Self-pay

## 2024-06-21 MED ORDER — LANTUS SOLOSTAR 100 UNIT/ML ~~LOC~~ SOPN: 20.0000 [IU] | PEN_INJECTOR | Freq: Every day | SUBCUTANEOUS | 4 refills | Status: AC

## 2024-06-21 NOTE — Telephone Encounter (Signed)
 done

## 2024-06-21 NOTE — Telephone Encounter (Unsigned)
 Copied from CRM [id number]. Topic: Clinical - Medication Refill >> Jun 21, 2024 10:45 AM Avram MATSU wrote: Medication: daughter stated pt insulin  pen did not work when she tried to give it to her mother. insulin  glargine (LANTUS  SOLOSTAR) 100 UNIT/ML Solostar Pen [[id number]]  Has the patient contacted their pharmacy? Yes (Agent: If no, request that the patient contact the pharmacy for the refill. If patient does not wish to contact the pharmacy document the reason why and proceed with request.) (Agent: If yes, when and what did the pharmacy advise?)  This is the patient's preferred pharmacy:  [facility] [place], [place] - [id number] [place] [address] Phone: [phone] Fax: [phone]  Is this the correct pharmacy for this prescription? Yes If no, delete pharmacy and type the correct one.   Has the prescription been filled recently? No  Is the patient out of the medication? Yes  Has the patient been seen for an appointment in the last year OR does the patient have an upcoming appointment? Yes  Can we respond through MyChart? Yes  Agent: Please be advised that Rx refills may take up to 3 business days. We ask that you follow-up with your pharmacy.

## 2024-06-25 ENCOUNTER — Other Ambulatory Visit: Payer: Self-pay | Admitting: Family Medicine

## 2024-06-26 ENCOUNTER — Other Ambulatory Visit: Payer: Self-pay | Admitting: Family Medicine

## 2024-06-28 ENCOUNTER — Other Ambulatory Visit: Payer: Self-pay | Admitting: Family Medicine

## 2024-07-02 ENCOUNTER — Other Ambulatory Visit: Payer: Self-pay | Admitting: Gastroenterology

## 2024-07-02 ENCOUNTER — Other Ambulatory Visit: Payer: Self-pay | Admitting: Internal Medicine

## 2024-07-02 DIAGNOSIS — K580 Irritable bowel syndrome with diarrhea: Secondary | ICD-10-CM

## 2024-07-02 DIAGNOSIS — F419 Anxiety disorder, unspecified: Secondary | ICD-10-CM

## 2024-07-02 MED ORDER — LORAZEPAM 1 MG PO TABS: ORAL_TABLET | ORAL | 1 refills | Status: AC

## 2024-07-10 ENCOUNTER — Inpatient Hospital Stay: Attending: Hematology

## 2024-07-10 DIAGNOSIS — D508 Other iron deficiency anemias: Secondary | ICD-10-CM | POA: Insufficient documentation

## 2024-07-10 DIAGNOSIS — E538 Deficiency of other specified B group vitamins: Secondary | ICD-10-CM | POA: Insufficient documentation

## 2024-07-10 DIAGNOSIS — E559 Vitamin D deficiency, unspecified: Secondary | ICD-10-CM | POA: Diagnosis not present

## 2024-07-10 DIAGNOSIS — D696 Thrombocytopenia, unspecified: Secondary | ICD-10-CM | POA: Insufficient documentation

## 2024-07-10 DIAGNOSIS — D509 Iron deficiency anemia, unspecified: Secondary | ICD-10-CM

## 2024-07-10 LAB — CBC WITH DIFFERENTIAL/PLATELET
Abs Immature Granulocytes: 0.03 K/uL (ref 0.00–0.07)
Basophils Absolute: 0.1 K/uL (ref 0.0–0.1)
Basophils Relative: 1 %
Eosinophils Absolute: 0.1 K/uL (ref 0.0–0.5)
Eosinophils Relative: 2 %
HCT: 34.1 % — ABNORMAL LOW (ref 36.0–46.0)
Hemoglobin: 10.3 g/dL — ABNORMAL LOW (ref 12.0–15.0)
Immature Granulocytes: 1 %
Lymphocytes Relative: 26 %
Lymphs Abs: 1.7 K/uL (ref 0.7–4.0)
MCH: 30.1 pg (ref 26.0–34.0)
MCHC: 30.2 g/dL (ref 30.0–36.0)
MCV: 99.7 fL (ref 80.0–100.0)
Monocytes Absolute: 0.7 K/uL (ref 0.1–1.0)
Monocytes Relative: 11 %
Neutro Abs: 4 K/uL (ref 1.7–7.7)
Neutrophils Relative %: 59 %
Platelets: 148 K/uL — ABNORMAL LOW (ref 150–400)
RBC: 3.42 MIL/uL — ABNORMAL LOW (ref 3.87–5.11)
RDW: 14.6 % (ref 11.5–15.5)
WBC: 6.6 K/uL (ref 4.0–10.5)
nRBC: 0 % (ref 0.0–0.2)

## 2024-07-10 LAB — IRON AND TIBC
Iron: 23 ug/dL — ABNORMAL LOW (ref 28–170)
Saturation Ratios: 12 % (ref 10.4–31.8)
TIBC: 183 ug/dL — ABNORMAL LOW (ref 250–450)
UIBC: 161 ug/dL

## 2024-07-10 LAB — COMPREHENSIVE METABOLIC PANEL WITH GFR
ALT: 10 U/L (ref 0–44)
AST: 20 U/L (ref 15–41)
Albumin: 3.6 g/dL (ref 3.5–5.0)
Alkaline Phosphatase: 111 U/L (ref 38–126)
Anion gap: 14 (ref 5–15)
BUN: 23 mg/dL (ref 8–23)
CO2: 14 mmol/L — ABNORMAL LOW (ref 22–32)
Calcium: 8.9 mg/dL (ref 8.9–10.3)
Chloride: 112 mmol/L — ABNORMAL HIGH (ref 98–111)
Creatinine, Ser: 1.15 mg/dL — ABNORMAL HIGH (ref 0.44–1.00)
GFR, Estimated: 49 mL/min — ABNORMAL LOW
Glucose, Bld: 156 mg/dL — ABNORMAL HIGH (ref 70–99)
Potassium: 4.3 mmol/L (ref 3.5–5.1)
Sodium: 140 mmol/L (ref 135–145)
Total Bilirubin: 0.5 mg/dL (ref 0.0–1.2)
Total Protein: 7 g/dL (ref 6.5–8.1)

## 2024-07-10 LAB — VITAMIN B12: Vitamin B-12: 456 pg/mL (ref 180–914)

## 2024-07-10 LAB — FERRITIN: Ferritin: 384 ng/mL — ABNORMAL HIGH (ref 11–307)

## 2024-07-12 LAB — METHYLMALONIC ACID, SERUM: Methylmalonic Acid, Quantitative: 384 nmol/L — ABNORMAL HIGH (ref 0–378)

## 2024-07-17 ENCOUNTER — Inpatient Hospital Stay: Admitting: Oncology

## 2024-07-17 VITALS — BP 126/53 | HR 48 | Temp 97.4°F | Resp 22

## 2024-07-17 DIAGNOSIS — Z809 Family history of malignant neoplasm, unspecified: Secondary | ICD-10-CM | POA: Diagnosis not present

## 2024-07-17 DIAGNOSIS — D696 Thrombocytopenia, unspecified: Secondary | ICD-10-CM

## 2024-07-17 DIAGNOSIS — E538 Deficiency of other specified B group vitamins: Secondary | ICD-10-CM

## 2024-07-17 DIAGNOSIS — E559 Vitamin D deficiency, unspecified: Secondary | ICD-10-CM

## 2024-07-17 DIAGNOSIS — D508 Other iron deficiency anemias: Secondary | ICD-10-CM | POA: Diagnosis not present

## 2024-07-17 NOTE — Assessment & Plan Note (Addendum)
 Reports several types of cancer in her family but most recently her half sister died of a GYN cancer. She believes her mom had breast cancer. She would like a referral to genetics.

## 2024-07-17 NOTE — Progress Notes (Signed)
 "  [facility] OFFICE PROGRESS NOTE  Del Wilhelmena Falter, Hilario, FNP  ASSESSMENT & PLAN:    Assessment & Plan Thrombocytopenia -Platelet count has been intermittently low over the past year ranging from 110-normal. -Most recent platelet count from 07/10/24 is 148. -Had CT abdomen on 04/29/2023 which did not reveal any hepatosplenomegaly. -Etiology of thrombocytopenia likely multifactorial.  Will continue to monitor. Other iron  deficiency anemia -Received 1 g Monoferric  on 03/30/2023 with good tolerance. -Labs from 07/10/2024 which shows iron  saturations 12%, ferritin 384, hemoglobin 10.3 (11.6). -Denies any bleeding per rectum. - Recommend 1 dose of Monoferric  given hemoglobin has declined as well as iron  saturations. -Follow-up in 4 months with labs a few days before and office visit. -She is unable to tolerate oral iron  secondary to GI upset. Vitamin D  deficiency She is currently taking vitamin D  supplements daily. No recent vitamin D  level. Vitamin B12 deficiency disease B12 level has improved from previous. She is currently taking B12 supplements daily. Recommend she continue B12 recheck in 4 months. Family history of cancer Reports several types of cancer in her family but most recently her half sister died of a GYN cancer. She believes her mom had breast cancer. She would like a referral to genetics.  Orders Placed This Encounter  Procedures   Vitamin D  (25 hydroxy)    Standing Status:   Future    Expected Date:   11/12/2024    Expiration Date:   02/14/2025   Methylmalonic acid, serum    Standing Status:   Future    Expected Date:   11/12/2024    Expiration Date:   02/14/2025   Vitamin B12    Standing Status:   Future    Expected Date:   11/12/2024    Expiration Date:   02/14/2025   Iron  and TIBC ([facility] ONLY)    Standing Status:   Future    Expected Date:   11/12/2024    Expiration Date:   02/14/2025   Ferritin    Standing Status:   Future     Expected Date:   11/12/2024    Expiration Date:   02/14/2025   Comprehensive metabolic panel    Standing Status:   Future    Expected Date:   11/12/2024    Expiration Date:   02/14/2025   CBC with Differential    Standing Status:   Future    Expected Date:   11/12/2024    Expiration Date:   02/14/2025   Ambulatory referral to Genetics    Referral Priority:   Routine    Referral Type:   Consultation    Referral Reason:   Specialty Services Required    Number of Visits Requested:   1    INTERVAL HISTORY: Patient returns for follow-up for iron  deficiency, B12 deficiency and thrombocytopenia.  She received 1 g of Monoferric  on 03/30/2023.   She denies any recent hospitalizations.  She is here today with her daughter.  Reports her half sister died on Christmas Eve due to vulvar cancer.  Patient's daughter is unsure if they said vaginal cancer or vulvar cancer but believes it was a GYN cancer.  Patient is concerned about cancer given recent diagnosis and death of her half-sister.  Reports she was 74 years old.  Patient has not followed up with GYN in quite some time.   Patient has past medical history significant for chronic respiratory failure and supraglottic stenosis.  She has her trach changed monthly and continues  to use her ventilator at home as needed.   She is currently not taking B12 supplements.  Reports overall feeling well today.  She has noticed she has felt slightly more fatigued over the past month.  She is taking OTC move free for her bone health.  We reviewed CBC, CMP, ferritin, iron  panel, B12 and MMA.  SUMMARY OF HEMATOLOGIC HISTORY: Oncology History   No problem history exists.   1.  Normocytic anemia: - She had a history of iron  deficiency anemia in the past, status post Injectafer , last given in November 2019. - She had 2 units of blood transfusion during recent hospitalization. - She had tracheotomy done as she cannot tolerate CPAP machine for sleep apnea. -  No rectal bleeding.    2.  Social/family history: - She is seen with her daughter Karna who is her caregiver.  Patient walks with the help of walker.  She is able to dress herself.  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  Quit smoking in 1994. - Sister had breast cancer.  Another sister had leukemia.  Maternal uncle had stomach cancer.  CBC    Component Value Date/Time   WBC 6.6 07/10/2024 1406   RBC 3.42 (L) 07/10/2024 1406   HGB 10.3 (L) 07/10/2024 1406   HGB 11.0 (L) 12/14/2023 1451   HCT 34.1 (L) 07/10/2024 1406   HCT 35.3 12/14/2023 1451   PLT 148 (L) 07/10/2024 1406   PLT 151 12/14/2023 1451   MCV 99.7 07/10/2024 1406   MCV 100 (H) 12/14/2023 1451   MCH 30.1 07/10/2024 1406   MCHC 30.2 07/10/2024 1406   RDW 14.6 07/10/2024 1406   RDW 14.0 12/14/2023 1451   LYMPHSABS 1.7 07/10/2024 1406   LYMPHSABS 2.0 12/14/2023 1451   MONOABS 0.7 07/10/2024 1406   EOSABS 0.1 07/10/2024 1406   EOSABS 0.0 12/14/2023 1451   BASOSABS 0.1 07/10/2024 1406   BASOSABS 0.1 12/14/2023 1451       Latest Ref Rng & Units 07/10/2024    2:06 PM 03/07/2024    1:30 PM 12/14/2023    2:51 PM  CMP  Glucose 70 - 99 mg/dL 843  856  805   BUN 8 - 23 mg/dL 23  28  21    Creatinine 0.44 - 1.00 mg/dL 8.84  9.08  8.88   Sodium 135 - 145 mmol/L 140  138  138   Potassium 3.5 - 5.1 mmol/L 4.3  4.0  4.3   Chloride 98 - 111 mmol/L 112  110  107   CO2 22 - 32 mmol/L 14  19  17    Calcium  8.9 - 10.3 mg/dL 8.9  8.9  9.1   Total Protein 6.5 - 8.1 g/dL 7.0  7.4    Total Bilirubin 0.0 - 1.2 mg/dL 0.5  0.8    Alkaline Phos 38 - 126 U/L 111  108    AST 15 - 41 U/L 20  19    ALT 0 - 44 U/L 10  17       Lab Results  Component Value Date   FERRITIN 384 (H) 07/10/2024   VITAMINB12 456 07/10/2024    Vitals:   07/17/24 1415  BP: (!) 126/53  Pulse: (!) 48  Resp: (!) 22  Temp: (!) 97.4 F (36.3 C)  SpO2: 100%      Review of System:  Review of Systems  Constitutional:  Positive  for malaise/fatigue.  Gastrointestinal:  Positive for constipation, diarrhea and nausea. Negative for blood in stool.  Genitourinary:  Positive for dysuria.  Musculoskeletal:  Positive for back pain and joint pain.  Neurological:  Positive for headaches.    Physical Exam: Physical Exam Constitutional:      Appearance: Normal appearance.  HENT:     Head: Normocephalic and atraumatic.  Eyes:     Pupils: Pupils are equal, round, and reactive to light.  Neck:     Comments: Trach in place Cardiovascular:     Rate and Rhythm: Normal rate and regular rhythm.     Heart sounds: Normal heart sounds. No murmur heard. Pulmonary:     Effort: Pulmonary effort is normal.     Breath sounds: Normal breath sounds. No wheezing.  Abdominal:     General: Bowel sounds are normal. There is no distension.     Palpations: Abdomen is soft.     Tenderness: There is no abdominal tenderness.  Musculoskeletal:        General: Normal range of motion.     Cervical back: Normal range of motion.  Skin:    General: Skin is warm and dry.     Findings: No rash.  Neurological:     Mental Status: She is alert and oriented to person, place, and time.     Gait: Gait is intact.  Psychiatric:        Mood and Affect: Mood and affect normal.        Cognition and Memory: Memory normal.        Judgment: Judgment normal.      I spent 20 minutes dedicated to the care of this patient (face-to-face and non-face-to-face) on the date of the encounter to include what is described in the assessment and plan.,  Delon Hope, NP 07/17/2024 3:11 PM "

## 2024-07-17 NOTE — Assessment & Plan Note (Addendum)
 B12 level has improved from previous. She is currently taking B12 supplements daily. Recommend she continue B12 recheck in 4 months.

## 2024-07-17 NOTE — Assessment & Plan Note (Addendum)
-  Received 1 g Monoferric  on 03/30/2023 with good tolerance. -Labs from 07/10/2024 which shows iron  saturations 12%, ferritin 384, hemoglobin 10.3 (11.6). -Denies any bleeding per rectum. - Recommend 1 dose of Monoferric  given hemoglobin has declined as well as iron  saturations. -Follow-up in 4 months with labs a few days before and office visit. -She is unable to tolerate oral iron  secondary to GI upset.

## 2024-07-17 NOTE — Assessment & Plan Note (Addendum)
-  Platelet count has been intermittently low over the past year ranging from 110-normal. -Most recent platelet count from 07/10/24 is 148. -Had CT abdomen on 04/29/2023 which did not reveal any hepatosplenomegaly. -Etiology of thrombocytopenia likely multifactorial.  Will continue to monitor.

## 2024-07-17 NOTE — Assessment & Plan Note (Addendum)
 She is currently taking vitamin D  supplements daily. No recent vitamin D  level.

## 2024-07-19 ENCOUNTER — Inpatient Hospital Stay

## 2024-07-19 VITALS — BP 109/80 | HR 75 | Temp 96.5°F | Resp 18

## 2024-07-19 DIAGNOSIS — D500 Iron deficiency anemia secondary to blood loss (chronic): Secondary | ICD-10-CM

## 2024-07-19 DIAGNOSIS — D508 Other iron deficiency anemias: Secondary | ICD-10-CM

## 2024-07-19 DIAGNOSIS — D696 Thrombocytopenia, unspecified: Secondary | ICD-10-CM | POA: Diagnosis not present

## 2024-07-19 MED ORDER — SODIUM CHLORIDE 0.9 % IV SOLN
1000.0000 mg | Freq: Once | INTRAVENOUS | Status: AC
  Administered 2024-07-19: 1000 mg via INTRAVENOUS
  Filled 2024-07-19: qty 1000

## 2024-07-19 MED ORDER — SODIUM CHLORIDE 0.9 % IV SOLN: Freq: Once | INTRAVENOUS | Status: AC

## 2024-07-19 MED ORDER — ACETAMINOPHEN 325 MG PO TABS
650.0000 mg | ORAL_TABLET | Freq: Once | ORAL | Status: AC
  Administered 2024-07-19: 650 mg via ORAL
  Filled 2024-07-19: qty 2

## 2024-07-19 NOTE — Patient Instructions (Signed)
 [facility] - A DEPT OF [facility]  Discharge Instructions: Thank you for choosing [facility] to provide your oncology and hematology care.  If you have a lab appointment with the Cancer Center - please note that after April 8th, 2024, all labs will be drawn in the cancer center.  You do not have to check in or register with the main entrance as you have in the past but will complete your check-in in the cancer center.  Wear comfortable clothing and clothing appropriate for easy access to any Portacath or PICC line.   We strive to give you quality time with your provider. You may need to reschedule your appointment if you arrive late (15 or more minutes).  Arriving late affects you and other patients whose appointments are after yours.  Also, if you miss three or more appointments without notifying the office, you may be dismissed from the clinic at the provider's discretion.      For prescription refill requests, have your pharmacy contact our office and allow 72 hours for refills to be completed.      To help prevent nausea and vomiting after your treatment, we encourage you to take your nausea medication as directed.  BELOW ARE SYMPTOMS THAT SHOULD BE REPORTED IMMEDIATELY: *FEVER GREATER THAN 100.4 F (38 C) OR HIGHER *CHILLS OR SWEATING *NAUSEA AND VOMITING THAT IS NOT CONTROLLED WITH YOUR NAUSEA MEDICATION *UNUSUAL SHORTNESS OF BREATH *UNUSUAL BRUISING OR BLEEDING *URINARY PROBLEMS (pain or burning when urinating, or frequent urination) *BOWEL PROBLEMS (unusual diarrhea, constipation, pain near the anus) TENDERNESS IN MOUTH AND THROAT WITH OR WITHOUT PRESENCE OF ULCERS (sore throat, sores in mouth, or a toothache) UNUSUAL RASH, SWELLING OR PAIN  UNUSUAL VAGINAL DISCHARGE OR ITCHING   Items with * indicate a potential emergency and should be followed up as soon as possible or go to the Emergency Department if any problems should  occur.  Please show the CHEMOTHERAPY ALERT CARD or IMMUNOTHERAPY ALERT CARD at check-in to the Emergency Department and triage nurse.  Should you have questions after your visit or need to cancel or reschedule your appointment, please contact [facility] [place] - A DEPT OF [facility] [phone]  and follow the prompts.  Office hours are 8:00 a.m. to 4:30 p.m. Monday - Friday. Please note that voicemails left after 4:00 p.m. may not be returned until the following business day.  We are closed weekends and major holidays. You have access to a nurse at all times for urgent questions. Please call the main number to the clinic [phone] and follow the prompts.  For any non-urgent questions, you may also contact your provider using MyChart. We now offer e-Visits for anyone 74 and older to request care online for non-urgent symptoms. For details visit [email or web address].   Also download the MyChart app! Go to the app store, search "MyChart", open the app, select [facility], and log in with your MyChart username and password.

## 2024-07-19 NOTE — Progress Notes (Signed)
 Ok to proceed with Monoferric  today per shara team.  Evyn Putzier, PharmD, MBA

## 2024-07-19 NOTE — Progress Notes (Signed)
 Patient tolerated iron infusion with no complaints voiced.  Peripheral IV site clean and dry with good blood return noted before and after infusion.  Band aid applied.  VSS with discharge and left in satisfactory condition with no s/s of distress noted.

## 2024-07-23 ENCOUNTER — Other Ambulatory Visit: Payer: Self-pay | Admitting: Family Medicine

## 2024-07-26 ENCOUNTER — Other Ambulatory Visit: Payer: Self-pay | Admitting: Family Medicine

## 2024-08-01 ENCOUNTER — Ambulatory Visit (HOSPITAL_COMMUNITY)

## 2024-08-02 ENCOUNTER — Telehealth: Payer: Self-pay

## 2024-08-02 NOTE — Telephone Encounter (Signed)
 Copied from CRM #[id number]. Topic: Clinical - Home Health Verbal Orders >> Aug 02, 2024 12:12 PM Antony RAMAN wrote: Caller/Agency: [facility] Callback Number: [phone] Service Requested: Physical Therapy Frequency: 2 week 1, 1 week 1 Any new concerns about the patient? No   Calling to see if we received the orders sent form her, needs signed

## 2024-08-02 NOTE — Telephone Encounter (Signed)
 Called [name] back to give verbal orders.

## 2024-08-08 ENCOUNTER — Telehealth: Payer: Self-pay

## 2024-08-08 NOTE — Telephone Encounter (Signed)
 Copied from CRM [id number]. Topic: Clinical - Medical Advice >> Aug 08, 2024 12:22 PM Charolett L wrote: Reason for CRM: Deanna called in to verify if orders were received that were sent on 01/30 and if they can be signed and sent back. She stated that the orders can't be verbally approved but they have to be signed  [facility] Callback Number: [phone]  Fax# [phone]

## 2024-08-09 NOTE — Telephone Encounter (Signed)
 Called and let [name] know that the provider would return 2/16 and the orders would be ssent back at that time. Voiced understanding and would make a note of this

## 2024-08-15 ENCOUNTER — Inpatient Hospital Stay: Attending: Hematology | Admitting: Licensed Clinical Social Worker

## 2024-11-20 ENCOUNTER — Inpatient Hospital Stay

## 2024-11-27 ENCOUNTER — Inpatient Hospital Stay: Admitting: Oncology
